# Patient Record
Sex: Male | Born: 1940 | Race: Black or African American | Hispanic: No | Marital: Married | Smoking: Former smoker
Health system: Southern US, Community
[De-identification: ages and names within clinical notes are randomized; demographics above are authoritative.]

## PROBLEM LIST (undated history)

## (undated) ENCOUNTER — Emergency Department (HOSPITAL_COMMUNITY): Payer: Medicare HMO | Source: Home / Self Care

## (undated) DIAGNOSIS — I428 Other cardiomyopathies: Secondary | ICD-10-CM

## (undated) DIAGNOSIS — K219 Gastro-esophageal reflux disease without esophagitis: Secondary | ICD-10-CM

## (undated) DIAGNOSIS — I639 Cerebral infarction, unspecified: Secondary | ICD-10-CM

## (undated) DIAGNOSIS — H409 Unspecified glaucoma: Secondary | ICD-10-CM

## (undated) DIAGNOSIS — M545 Low back pain, unspecified: Secondary | ICD-10-CM

## (undated) DIAGNOSIS — Z992 Dependence on renal dialysis: Secondary | ICD-10-CM

## (undated) DIAGNOSIS — M1990 Unspecified osteoarthritis, unspecified site: Secondary | ICD-10-CM

## (undated) DIAGNOSIS — R011 Cardiac murmur, unspecified: Secondary | ICD-10-CM

## (undated) DIAGNOSIS — Z9289 Personal history of other medical treatment: Secondary | ICD-10-CM

## (undated) DIAGNOSIS — C61 Malignant neoplasm of prostate: Secondary | ICD-10-CM

## (undated) DIAGNOSIS — J189 Pneumonia, unspecified organism: Secondary | ICD-10-CM

## (undated) DIAGNOSIS — G4730 Sleep apnea, unspecified: Secondary | ICD-10-CM

## (undated) DIAGNOSIS — E785 Hyperlipidemia, unspecified: Secondary | ICD-10-CM

## (undated) DIAGNOSIS — K759 Inflammatory liver disease, unspecified: Secondary | ICD-10-CM

## (undated) DIAGNOSIS — I5041 Acute combined systolic (congestive) and diastolic (congestive) heart failure: Secondary | ICD-10-CM

## (undated) DIAGNOSIS — Z9889 Other specified postprocedural states: Secondary | ICD-10-CM

## (undated) DIAGNOSIS — G8929 Other chronic pain: Secondary | ICD-10-CM

## (undated) DIAGNOSIS — N186 End stage renal disease: Secondary | ICD-10-CM

## (undated) DIAGNOSIS — B20 Human immunodeficiency virus [HIV] disease: Secondary | ICD-10-CM

## (undated) DIAGNOSIS — I4891 Unspecified atrial fibrillation: Secondary | ICD-10-CM

## (undated) DIAGNOSIS — I509 Heart failure, unspecified: Secondary | ICD-10-CM

## (undated) DIAGNOSIS — I5042 Chronic combined systolic (congestive) and diastolic (congestive) heart failure: Secondary | ICD-10-CM

## (undated) DIAGNOSIS — R001 Bradycardia, unspecified: Secondary | ICD-10-CM

## (undated) DIAGNOSIS — Z973 Presence of spectacles and contact lenses: Secondary | ICD-10-CM

## (undated) DIAGNOSIS — M109 Gout, unspecified: Secondary | ICD-10-CM

## (undated) DIAGNOSIS — I10 Essential (primary) hypertension: Secondary | ICD-10-CM

## (undated) DIAGNOSIS — I5043 Acute on chronic combined systolic (congestive) and diastolic (congestive) heart failure: Secondary | ICD-10-CM

## (undated) DIAGNOSIS — D509 Iron deficiency anemia, unspecified: Secondary | ICD-10-CM

## (undated) DIAGNOSIS — C801 Malignant (primary) neoplasm, unspecified: Secondary | ICD-10-CM

## (undated) DIAGNOSIS — A429 Actinomycosis, unspecified: Secondary | ICD-10-CM

## (undated) DIAGNOSIS — I469 Cardiac arrest, cause unspecified: Secondary | ICD-10-CM

## (undated) DIAGNOSIS — Z21 Asymptomatic human immunodeficiency virus [HIV] infection status: Secondary | ICD-10-CM

## (undated) HISTORY — DX: Essential (primary) hypertension: I10

## (undated) HISTORY — DX: Actinomycosis, unspecified: A42.9

## (undated) HISTORY — DX: Malignant (primary) neoplasm, unspecified: C80.1

## (undated) HISTORY — DX: Human immunodeficiency virus (HIV) disease: B20

## (undated) HISTORY — PX: EYE SURGERY: SHX253

## (undated) HISTORY — PX: TRANSTHORACIC ECHOCARDIOGRAM: SHX275

## (undated) HISTORY — DX: Asymptomatic human immunodeficiency virus (hiv) infection status: Z21

## (undated) HISTORY — PX: INSERTION PROSTATE RADIATION SEED: SUR718

## (undated) HISTORY — DX: Chronic combined systolic (congestive) and diastolic (congestive) heart failure: I50.42

## (undated) HISTORY — DX: Personal history of other medical treatment: Z92.89

## (undated) HISTORY — DX: Acute combined systolic (congestive) and diastolic (congestive) heart failure: I50.41

## (undated) HISTORY — DX: Other cardiomyopathies: I42.8

## (undated) HISTORY — PX: GLAUCOMA SURGERY: SHX656

## (undated) HISTORY — DX: Hyperlipidemia, unspecified: E78.5

## (undated) HISTORY — DX: Bradycardia, unspecified: R00.1

## (undated) HISTORY — PX: CATARACT EXTRACTION, BILATERAL: SHX1313

## (undated) HISTORY — DX: Other specified postprocedural states: Z98.890

## (undated) HISTORY — DX: Iron deficiency anemia, unspecified: D50.9

---

## 2000-02-01 ENCOUNTER — Emergency Department (HOSPITAL_COMMUNITY): Admission: EM | Admit: 2000-02-01 | Discharge: 2000-02-01 | Payer: Self-pay | Admitting: Emergency Medicine

## 2000-02-01 ENCOUNTER — Encounter: Payer: Self-pay | Admitting: Emergency Medicine

## 2000-02-08 ENCOUNTER — Emergency Department (HOSPITAL_COMMUNITY): Admission: EM | Admit: 2000-02-08 | Discharge: 2000-02-08 | Payer: Self-pay | Admitting: Emergency Medicine

## 2002-12-04 HISTORY — PX: LEFT HEART CATH AND CORONARY ANGIOGRAPHY: CATH118249

## 2008-05-22 ENCOUNTER — Ambulatory Visit (HOSPITAL_BASED_OUTPATIENT_CLINIC_OR_DEPARTMENT_OTHER): Admission: RE | Admit: 2008-05-22 | Discharge: 2008-05-22 | Payer: Self-pay | Admitting: Urology

## 2008-05-22 ENCOUNTER — Encounter (INDEPENDENT_AMBULATORY_CARE_PROVIDER_SITE_OTHER): Payer: Self-pay | Admitting: Urology

## 2008-12-04 HISTORY — PX: RADIOACTIVE SEED IMPLANT: SHX5150

## 2009-05-28 ENCOUNTER — Ambulatory Visit (HOSPITAL_COMMUNITY): Admission: RE | Admit: 2009-05-28 | Discharge: 2009-05-28 | Payer: Self-pay | Admitting: Urology

## 2009-07-02 ENCOUNTER — Ambulatory Visit: Admission: RE | Admit: 2009-07-02 | Discharge: 2009-09-27 | Payer: Self-pay | Admitting: Radiation Oncology

## 2009-09-27 ENCOUNTER — Ambulatory Visit: Admission: RE | Admit: 2009-09-27 | Discharge: 2009-10-24 | Payer: Self-pay | Admitting: Radiation Oncology

## 2009-10-19 ENCOUNTER — Encounter: Admission: RE | Admit: 2009-10-19 | Discharge: 2009-10-19 | Payer: Self-pay | Admitting: Urology

## 2009-11-12 ENCOUNTER — Ambulatory Visit (HOSPITAL_BASED_OUTPATIENT_CLINIC_OR_DEPARTMENT_OTHER): Admission: RE | Admit: 2009-11-12 | Discharge: 2009-11-12 | Payer: Self-pay | Admitting: Urology

## 2009-11-25 ENCOUNTER — Ambulatory Visit: Admission: RE | Admit: 2009-11-25 | Discharge: 2009-12-03 | Payer: Self-pay | Admitting: Radiation Oncology

## 2009-12-27 ENCOUNTER — Ambulatory Visit: Admission: RE | Admit: 2009-12-27 | Discharge: 2010-03-04 | Payer: Self-pay | Admitting: Radiation Oncology

## 2010-01-25 ENCOUNTER — Encounter: Admission: RE | Admit: 2010-01-25 | Discharge: 2010-01-25 | Payer: Self-pay | Admitting: Cardiovascular Disease

## 2010-02-21 HISTORY — PX: NM MYOCAR PERF WALL MOTION: HXRAD629

## 2010-06-03 DIAGNOSIS — I5042 Chronic combined systolic (congestive) and diastolic (congestive) heart failure: Secondary | ICD-10-CM

## 2010-06-03 HISTORY — DX: Chronic combined systolic (congestive) and diastolic (congestive) heart failure: I50.42

## 2010-11-23 ENCOUNTER — Emergency Department (HOSPITAL_COMMUNITY)
Admission: EM | Admit: 2010-11-23 | Discharge: 2010-11-23 | Payer: Self-pay | Source: Home / Self Care | Admitting: Emergency Medicine

## 2010-11-24 ENCOUNTER — Observation Stay (HOSPITAL_COMMUNITY)
Admission: EM | Admit: 2010-11-24 | Discharge: 2010-11-24 | Payer: Self-pay | Source: Home / Self Care | Admitting: Emergency Medicine

## 2010-11-24 ENCOUNTER — Encounter (INDEPENDENT_AMBULATORY_CARE_PROVIDER_SITE_OTHER): Payer: Self-pay | Admitting: Emergency Medicine

## 2010-11-30 ENCOUNTER — Ambulatory Visit: Payer: Self-pay | Admitting: Family

## 2010-11-30 DIAGNOSIS — E785 Hyperlipidemia, unspecified: Secondary | ICD-10-CM

## 2010-11-30 DIAGNOSIS — C61 Malignant neoplasm of prostate: Secondary | ICD-10-CM

## 2010-11-30 DIAGNOSIS — R7309 Other abnormal glucose: Secondary | ICD-10-CM | POA: Insufficient documentation

## 2010-11-30 DIAGNOSIS — G459 Transient cerebral ischemic attack, unspecified: Secondary | ICD-10-CM

## 2010-11-30 LAB — CONVERTED CEMR LAB
Cholesterol, target level: 200 mg/dL
HDL goal, serum: 40 mg/dL
LDL Goal: 100 mg/dL

## 2010-12-21 ENCOUNTER — Telehealth: Payer: Self-pay | Admitting: Family

## 2010-12-21 ENCOUNTER — Encounter: Payer: Self-pay | Admitting: Family

## 2010-12-25 ENCOUNTER — Encounter: Payer: Self-pay | Admitting: Internal Medicine

## 2010-12-26 ENCOUNTER — Ambulatory Visit
Admission: RE | Admit: 2010-12-26 | Discharge: 2010-12-26 | Payer: Self-pay | Source: Home / Self Care | Attending: Family | Admitting: Family

## 2010-12-26 ENCOUNTER — Encounter (INDEPENDENT_AMBULATORY_CARE_PROVIDER_SITE_OTHER): Payer: Self-pay | Admitting: *Deleted

## 2010-12-26 ENCOUNTER — Encounter: Payer: Self-pay | Admitting: Family

## 2010-12-27 ENCOUNTER — Telehealth: Payer: Self-pay | Admitting: Family

## 2010-12-29 ENCOUNTER — Telehealth: Payer: Self-pay | Admitting: Family

## 2011-01-05 NOTE — Progress Notes (Signed)
----   Converted from flag ---- ---- 12/29/2010 8:49 AM, Kelle Darting CMA (AAMA) wrote: Per York Cerise, the pt's cost for Zostavax in the office as of today is around $85. If pt decides to get inj. we will need to verify cost again as it may change. Spoke to pt this a.m. and he states he is going to hold off on Zostavax at this time due to several upcoming appts and costs.  ---- 12/26/2010 12:38 PM, [name] FNP wrote: Could you please check with York Cerise about coverage with [facility] for the zostavax?  Dr.  Shawna Orleans told me that they have some list or way of verifying insurance coverage.  Carlyon Shadow may know more.  Let me know- thanks! ------------------------------

## 2011-01-05 NOTE — Miscellaneous (Signed)
Summary: LAB ORDER  Clinical Lists Changes  Orders: Added new Test order of T-Basic Metabolic Panel (99991111) - Signed Added new Test order of T-Hepatic Function [phone]) - Signed Added new Test order of T-Lipid Profile [phone]) - Signed Added new Test order of T-CBC w/Diff [phone]) - Signed Added new Test order of T-TSH [id number]) - Signed

## 2011-01-05 NOTE — Assessment & Plan Note (Signed)
Summary: 1 month follow up/mhf--rm 5   Vital Signs:  Patient profile:   70 year old male Height:      73.5 inches Weight:      235.50 pounds BMI:     30.76 Temp:     98.1 degrees F oral Pulse rate:   72 / minute Pulse rhythm:   regular Resp:     16 per minute BP sitting:   110 / 70  (right arm) Cuff size:   large  Vitals Entered By: Kelle Darting CMA Deborra Medina) (December 26, 2010 9:45 AM) CC: Pt here for 1 month follow up. Is Patient Diabetic? No Pain Assessment Patient in pain? no      Comments Needs refill on Simvastatin. Pt states his doctor stopped Aggrenox (not needed). Increase ASA to 325mg  1 once daily and added Spironolactone 25mg  1 once daily. Gilmore Laroche Fergerson CMA Deborra Medina)  December 26, 2010 9:57 AM    Primary Care Provider:  [name] FNP  CC:  Pt here for 1 month follow up.Marland Kitchen  History of Present Illness: Mr.  Zidek is a 70 year old male who presents today for his 1 month follow up as well as CPX.  1) Hyperlipidemia-  Continues statin (LDL was 115 on 12/22)  2)Hx of TIA-  Pt reports that he was unable to tolerate aggrenox due to diarrhea.  His cardiologist stopped aggrenox and placed him on a full does aspirin.  3) Hx of adenocarcinoma of the prostate-  He follows with Dr. Janice Norrie and has a follow up apt scheduled in 1 month.   4) Preventative-  walks his dog twice daily, Last tetanus shot was greater than 10 years ago.  Pneumonia shot - never per patient.  Diet- portions "too big."   Eats a low sodium diet.    Allergies (verified): No Known Drug Allergies  Past History:  Past Medical History: Last updated: 12/26/2010 Hx of prostate cancer- s/p radioactive seed implant 11/10 (Dr. Janice Norrie) HTN- followed by [facility] and Vascular (Dr. Dani Gobble Croitoru) Hypercholesterolemia-  tachycardia? 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 Married- lives with his wife and 4 dogs (pit bulls) Alcohol use-yes Regular exercise-no  Review of Systems       Constitutional: Denies Fever ENT:  Denies nasal congestion or sore throat. Resp: Denies cough CV:  Denies Chest Pain GI:  Denies nausea or vomitting GU: Denies dysuria, good flow since starting finasteride Lymphatic: Denies lymphadenopathy Musculoskeletal:  Denies muscle/joint pain Skin:  Denies Rashes, or concerning lesions Psychiatric: Denies depression Neuro: Denies numbness or weakness     Denies CP, SOB or edema  Physical Exam  General:  Well-developed,well-nourished,in no acute distress; alert,appropriate and cooperative throughout examination Head:  Normocephalic and atraumatic without obvious abnormalities. No apparent alopecia or balding. Eyes:  PERRLA, sclera is clear.   Ears:  External ear exam shows no significant lesions or deformities.  Otoscopic examination reveals clear canals, tympanic membranes are intact bilaterally without bulging, retraction, inflammation or discharge. Hearing is grossly normal bilaterally. Mouth:  Oral mucosa and oropharynx without lesions or exudates.  Teeth in good repair. Neck:  No deformities, masses, or tenderness noted. Lungs:  Normal respiratory effort, chest expands symmetrically. Lungs are clear  to auscultation, no crackles or wheezes. Heart:  Normal rate and regular rhythm. S1 and S2 normal without gallop, murmur, click, rub or other extra sounds. Abdomen:  Bowel sounds positive,abdomen soft and non-tender without masses, organomegaly or hernias noted. Rectal:  Deferred to urology Prostate:  deferred to Urology Msk:  No deformity or scoliosis noted of thoracic or lumbar spine.   Pulses:  negative carotid  bruits. Extremities:  No clubbing, cyanosis, edema, or deformity noted with normal full range of motion of all joints.   Neurologic:  alert & oriented X3, cranial nerves II-XII intact, strength normal in all extremities, and DTRs symmetrical and normal.   Skin:  Intact without suspicious lesions or rashes Cervical Nodes:  No lymphadenopathy noted Psych:  Cognition and judgment appear intact. Alert and cooperative with normal attention span and concentration. No apparent delusions, illusions, hallucinations   Impression & Recommendations:  Problem # 1:  Preventive Health Care (ICD-V70.0) Assessment Comment Only Immunizations reviewed.  Pneumovax and tetanus given today.  Patient counseled on diet, exercise and weight loss.  Never had colonoscopy.  Will refer.  Urology is monitoring his PSA/prostate.  Also candidate for Zostavax- pt wished to find out if his insurance will cover first.  Problem # 2:  HYPERLIPIDEMIA (ICD-272.4) Assessment: Comment Only LDL not at goal.  Will plan to treat to LDL < 70.  It appears that he was started on simvastatin only after his TIA.  Pt to return for a fasting lipid profile. His updated medication list for this problem includes:    Simvastatin 20 Mg Tabs (Simvastatin) .Marland Kitchen... Take 1 tab by mouth at bedtime.  Problem # 3:  HYPERTENSION (ICD-401.9) Assessment: Comment Only Stable, cardiology is managing his BP meds and recently added spironolactone. His updated medication list for this problem includes:    Losartan Potassium-hctz 100-25 Mg Tabs (Losartan potassium-hctz) .Marland Kitchen... Take 1 tablet by mouth once a day.    Carvedilol 25 Mg Tabs (Carvedilol) .Marland Kitchen... Take 1 tablet by mouth two times a day.    Terazosin Hcl 10 Mg Caps (Terazosin hcl) .Marland Kitchen... Take 1 tab by mouth at bedtime.    Spironolactone 25 Mg Tabs (Spironolactone) .Marland Kitchen... Take 1 tablet by mouth once a day.  BP today: 110/70 Prior BP: 132/82 (11/30/2010)  Prior 10 Yr Risk Heart Disease: Not enough  information (11/30/2010)  Complete Medication List: 1)  Losartan Potassium-hctz 100-25 Mg Tabs (Losartan potassium-hctz) .... Take 1 tablet by mouth once a day. 2)  Finasteride 5 Mg Tabs (Finasteride) .... Take 1 tablet by mouth once a day 3)  Carvedilol 25 Mg Tabs (Carvedilol) .... Take 1 tablet by mouth two times a day. 4)  Terazosin Hcl 10 Mg Caps (Terazosin hcl) .... Take 1 tab by mouth at bedtime. 5)  Simvastatin 20 Mg Tabs (Simvastatin) .... Take 1 tab by mouth at bedtime. 6)  Spironolactone 25 Mg Tabs (Spironolactone) .... Take 1 tablet by mouth once a day. 7)  Ecotrin 325 Mg Tbec (Aspirin) .... Take 1 tablet by mouth once a day.  Other Orders: Tdap => 30yrs IM [id number]) Pneumococcal Vaccine [id number]) Admin 1st Vaccine [id number]) Admin of Any Addtl Vaccine [id number]) Gastroenterology Referral (GI)  Patient Instructions: 1)  You will be contacted about your referral for a colonoscopy.   2)  Please complete your lab work fasting on the first floor within the nex 1-2 weeks. 3)  Follow up in 3 months.   Orders Added: 1)  Tdap => 35yrs IM [id number] 2)  Pneumococcal Vaccine [id number]  3)  Admin 1st Vaccine [90471] 4)  Admin of Any Addtl Vaccine [90472] 5)  Gastroenterology Referral [GI] 6)  Est. Patient 65& > [99397] 7)  Est. Patient Level III [id number]   Immunizations Administered:  Tetanus Vaccine:    Vaccine Type: Tdap    Site: right deltoid    Mfr: GlaxoSmithKline    Dose: 0.5 ml    Route: IM    Given by: Kelle Darting CMA ([place])    Exp. Date: 09/22/2012    Lot #: [id number]    VIS given: 10/21/08 version given December 26, 2010.  Pneumonia Vaccine:    Vaccine Type: Pneumovax    Site: left deltoid    Mfr: Merck    Dose: 0.5 ml    Route: IM    Given by: Kelle Darting CMA ([place])    Exp. Date: 04/27/2012    Lot #: [id number]    VIS given: 11/08/09 version given December 26, 2010.   Immunizations Administered:  Tetanus Vaccine:    Vaccine Type: Tdap    Site: right  deltoid    Mfr: GlaxoSmithKline    Dose: 0.5 ml    Route: IM    Given by: Kelle Darting CMA ([place])    Exp. Date: 09/22/2012    Lot #: [id number]    VIS given: 10/21/08 version given December 26, 2010.  Pneumonia Vaccine:    Vaccine Type: Pneumovax    Site: left deltoid    Mfr: Merck    Dose: 0.5 ml    Route: IM    Given by: Kelle Darting CMA ([place])    Exp. Date: 04/27/2012    Lot #: [id number]    VIS given: 11/08/09 version given December 26, 2010.  Current Allergies (reviewed today): No known allergies

## 2011-01-05 NOTE — Assessment & Plan Note (Signed)
   Allergies: No Known Drug Allergies  Past History:  Past Medical History: Hx of prostate cancer- s/p radioactive seed implant 11/10 (Dr. Janice Norrie) HTN- followed by [facility] and Vascular (Dr. Dani Gobble Croitoru) Hypercholesterolemia-  tachycardia? 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  (LVEF 45%- 06/2010) Cardiac Cath 2004-  neg for CAD Mycoardal perfusion study neg for coronary insufficiency (LVEF 27%) 3/11  Past Surgical History: prostate seed implants--2010   Complete Medication List: 1)  Losartan Potassium-hctz 100-25 Mg Tabs (Losartan potassium-hctz) .... Take 1 tablet by mouth once a day. 2)  Finasteride 5 Mg Tabs (Finasteride) .... Take 1 tablet by mouth once a day 3)  Carvedilol 25 Mg Tabs (Carvedilol) .... Take 1 tablet by mouth two times a day. 4)  Terazosin Hcl 10 Mg Caps (Terazosin hcl) .... Take 1 tab by mouth at bedtime. 5)  Aggrenox 25-200 Mg Xr12h-cap (Aspirin-dipyridamole) .... One cap by mouth once daily for 1 week, then increase to twice daily on second week 6)  Simvastatin 20 Mg Tabs (Simvastatin) .... Take 1 tab by mouth at bedtime.

## 2011-01-05 NOTE — Assessment & Plan Note (Signed)
Summary: new to be est [facility]/mhf--rm 4   Vital Signs:  Patient profile:   70 year old male Height:      73.5 inches Weight:      234 pounds BMI:     30.56 O2 Sat:      99 % on Room air Temp:     97.8 degrees F oral Pulse rate:   69 / minute Pulse rhythm:   regular Resp:     16 per minute BP sitting:   132 / 82  (right arm) Cuff size:   large  Vitals Entered By: Kelle Darting CMA Deborra Medina) (November 30, 2010 9:54 AM)  O2 Flow:  Room air CC: New pt to establish care. Recently evaluated in the ER for possible strokes and elevated cholesterol., Lipid Management Is Patient Diabetic? No Pain Assessment Patient in pain? no        CC:  New pt to establish care. Recently evaluated in the ER for possible strokes and elevated cholesterol. and Lipid Management.  History of Present Illness: Mr.  Go is a 70 year old male who presents today to establish care.  Presents today following an overnight stay at [facility] for a suspected TIA.  1)TIA-  Reports that he woke up on 12/22 with numbness and right arm weakness on/off for 2 hours.   Symptoms at that time were worsened by nothing and were improved by nothing.  he went to the [facility] ED where he underwent stroke evaluation. CT head was negative for acute CVA.  An MRI was attempted, but discontinued due to finding of some metal appearing shard in he orbit.   Denied associated slurred speech.  He reports that he was on aspirin 81 mg at the time of the event.  2)CHF-  he denies know history of this.  2-D echo notes LVEF of 30-35% and grade 1 Diastolic dysfunction.  He does follow with [place] and V[facility] (previously Dr. Melvern Banker) but tells me that they only follow him for his blood pressure.    3)Hyperlipidemia- pt was noted to have mild elevation of LDL at [facility] was sent home on a statin.    4) Hyperglycemia- pt was noted to have an A1C of 6.3.  Prior to this he was unaware of any hyperglycemia issues.  Lipid Management History:  Positive NCEP/ATP III risk factors include male age 31 years old or older, hypertension, and prior stroke (or TIA).  Negative NCEP/ATP III risk factors include non-tobacco-user status.    Preventive Screening-Counseling & Management  Alcohol-Tobacco     Alcohol drinks/day: <1     Smoking Status: quit     Year Quit: 2008  Caffeine-Diet-Exercise     Caffeine use/day: 2 cups coffee daily, 3-4 sodas daily     Does Patient Exercise: no  Allergies (verified): No Known Drug Allergies  Past History:  Family History: Last updated: 11/30/2010 HTN-- mother, [employer] Diabets-- MGM Thyroid prob?-- mother  3 children 1 son-- gallstones 1 daughter-- gallstones  Past Medical History: Hx of prostate cancer- s/p radioactive seed implant 11/10 (Dr. Janice Norrie) HTN- followed by [facility] Vascular Hypercholesterolemia-  tachycardia?  Past Surgical History: prostate seed implants--2010  Family History: HTN-- mother, [employer] Diabets-- MGM Thyroid prob?-- mother  3 children 1 son-- gallstones 1 daughter-- gallstones  Social History: Retired. Stays active at home. Married Alcohol use-yes Regular exercise-no Smoking Status:  quit Caffeine use/day:  2 cups coffee daily, 3-4 sodas daily Does Patient Exercise:  no  Review of Systems  Constitutional: Denies Fever ENT:  Denies nasal congestion or sore throat. Resp: Denies cough CV:  Denies Chest Pain GI:  Denies nausea or vomitting, occasional abdominal gas/belching GU: Denies dysuria,  slow urinary stream- using finasteride per dr. Janice Norrie with improvement. Lymphatic: Denies lymphadenopathy Musculoskeletal:  Denies muscle/joint pain, some left shoulder pain with lifting Skin:  Denies Rashes Psychiatric: Denies depression or anxiety Neuro: Denies numbness or weakness today.     Physical Exam  General:  Well-developed,well-nourished,in no acute distress; alert,appropriate and cooperative throughout examination Head:   Normocephalic and atraumatic without obvious abnormalities. No apparent alopecia or balding. Lungs:  Normal respiratory effort, chest expands symmetrically. Lungs are clear to auscultation, no crackles or wheezes. Heart:  Normal rate and regular rhythm. S1 and S2 normal without gallop, murmur, click, rub or other extra sounds. Extremities:  No peripheral edema is noted Neurologic:  alert & oriented X3, strength normal in all extremities, gait normal, and DTRs symmetrical and normal.   Psych:  Cognition and judgment appear intact. Alert and cooperative with normal attention span and concentration. No apparent delusions, illusions, hallucinations   Impression & Recommendations:  Problem # 1:  TIA (ICD-435.9) Assessment New Patient with recent symptoms consistent with TIA.  Was on baby aspirin at time of event.  Will swich to Aggrenox.  Work up included carotid dopplers which were negative, 2-D echo samples provided for aggrenox 3/14 exp lot [id number]  His updated medication list for this problem includes:    Aggrenox 25-200 Mg Xr12h-cap (Aspirin-dipyridamole) ..... One cap by mouth once daily for 1 week, then increase to twice daily on second week  Problem # 2:  DIASTOLIC DYSFUNCTION (0000000.9) Assessment: Comment Only  Not sure if this is a new finding.  He has f/u with cardiology in February.  Will see if they can see him sooner.   Records requested.  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  This is being followed by Dr. Lowella Bandy  Problem # 4:  HYPERLIPIDEMIA (ICD-272.4) Assessment: New Plan to continue simvastatin His updated medication list for this problem includes:    Simvastatin 20 Mg Tabs (Simvastatin) .Marland Kitchen... Take 1 tab by mouth at bedtime.  Problem # 5:  HYPERGLYCEMIA (ICD-790.29) Assessment: New Patient was counseled on low fat diabetic diet,  exercise, and weight loss.    Complete Medication List: 1)  Losartan Potassium-hctz 100-25 Mg Tabs (Losartan potassium-hctz) .... Take 1 tablet by mouth once a day. 2)  Finasteride 5 Mg Tabs (Finasteride) .... Take 1 tablet by mouth once a day 3)  Carvedilol 25 Mg Tabs (Carvedilol) .... Take 1 tablet by mouth two times a day. 4)  Terazosin Hcl 10 Mg Caps (Terazosin hcl) .... Take 1 tab by mouth at bedtime. 5)  Aggrenox 25-200 Mg Xr12h-cap (Aspirin-dipyridamole) .... One cap by mouth once daily for 1 week, then increase to twice daily on second week 6)  Simvastatin 20 Mg Tabs (Simvastatin) .... Take 1 tab by mouth at bedtime.  Lipid Assessment/Plan:      Based on NCEP/ATP III, the patient's risk factor category is "history of coronary disease, peripheral vascular disease, cerebrovascular disease, or aortic aneurysm".  The patient's lipid goals are as follows: Total cholesterol goal is 200; LDL cholesterol goal is 100; HDL cholesterol goal is 40; Triglyceride goal is 150.     Patient Instructions: 1)  Work hard on diet, exercise, and weight loss.  2)  Limit  your carbohydrates and avoid concentrated sweets. 3)  Stop Aspirin, start aggrenox.   4)  Go to ER if you develop recurrent weakness, numbness, or slurred speech. 5)  Follow up in 1 month, come fasting to this appointment. Prescriptions: SIMVASTATIN 20 MG TABS (SIMVASTATIN) Take 1 tab by mouth at bedtime.  #30 x 0   Entered and Authorized by:   [name] FNP   Signed by:   [name] FNP on 11/30/2010   Method used:   Electronically to        [facility]. [id number]* (retail)       [place], [place]  [address]       Ph: [phone]       Fax: [phone]   RxID:   [id number] AGGRENOX 25-200 MG XR12H-CAP (ASPIRIN-DIPYRIDAMOLE) one cap by mouth once daily for 1 week, then increase to twice daily on second week  #60 x 0   Entered and Authorized by:   [name] FNP   Signed  by:   [name] FNP on 11/30/2010   Method used:   Electronically to        [facility]. [id number]* (retail)       [place], [place]  [address]       Ph: [phone]       Fax: [phone]   RxID:   [id number]    Orders Added: 1)  Cardiology Referral [Cardiology] 2)  New Patient Level III [99203]      Preventive Care Screening  Last Flu Shot:    Date:  10/04/2010    Results:  historical      Pt doesn't remember last tetanus shot. Never had colonoscopy. Gilmore Laroche Fergerson CMA ([place])  November 30, 2010 10:14 AM    Current Allergies (reviewed today): No known allergies

## 2011-01-05 NOTE — Letter (Signed)
Summary: Pre Visit Letter Revised  [facility]  [place], [place] [address]   Phone: [phone]  Fax: [phone]        12/26/2010 MRN: [id number] David [name] [address] [place], [place]  [address]             Procedure Date:  January 25, 2011   dir col -Dr. Carlean Purl  Welcome to the Gastroenterology Division at [facility].    You are scheduled to see a nurse for your pre-procedure visit on January 11, 2011 at 1:00pm on the 3rd floor at [facility] [place] [facility].  We ask that you try to arrive at our office 15 minutes prior to your appointment time to allow for check-in.  Please take a minute to review the attached form.  If you answer "Yes" to one or more of the questions on the first page, we ask that you call the person listed at your earliest opportunity.  If you answer "No" to all of the questions, please complete the rest of the form and bring it to your appointment.    Your nurse visit will consist of discussing your medical and surgical history, your immediate family medical history, and your medications.   If you are unable to list all of your medications on the form, please bring the medication bottles to your appointment and we will list them.  We will need to be aware of both prescribed and over the counter drugs.  We will need to know exact dosage information as well.    Please be prepared to read and sign documents such as consent forms, a financial agreement, and acknowledgement forms.  If necessary, and with your consent, a friend or relative is welcome to sit-in on the nurse visit with you.  Please bring your insurance card so that we may make a copy of it.  If your insurance requires a referral to see a specialist, please bring your referral form from your primary care physician.  No co-pay is required for this nurse visit.     If you cannot keep your appointment, please call [phone] to cancel or reschedule prior  to your appointment date.  This allows [place] the opportunity to schedule an appointment for another patient in need of care.    Thank you for choosing [facility] for your medical needs.  We appreciate the opportunity to care for you.  Please visit [place] at our website  to learn more about our practice.  Sincerely, The Gastroenterology Division

## 2011-01-05 NOTE — Progress Notes (Addendum)
----   Converted from flag ---- ---- 12/26/2010 3:30 PM, Kelle Darting CMA (AAMA) wrote: Order has been entered and faxed.  ---- 12/26/2010 10:27 AM, [name] FNP wrote: Please fax order for  TSH, FLP, BMET, LFT, CBC to lab (v70) ------------------------------  Appended Document:  Received call from [place] in the lab stating [employer] will not cover FLP, CBC & TSH with V70.0 code. Are there other codes to submit?  Appended Document:  The lipid is denied due to  test too frequent per Katrina.  Appended Document:  I have reviewed laboratories performed during his hospitalization and we can cancel above labs.  Please notify patient and let him know that we apologize for any inconvenience.    Appended Document:  Pt and lab notified.

## 2011-01-05 NOTE — Progress Notes (Signed)
Summary: Records received  Phone Note Call from Patient   Summary of Call: Records received from [facility] and forwarded to Provider for review.  Gilmore Laroche Fergerson CMA Deborra Medina)  December 21, 2010 12:01 PM

## 2011-01-10 ENCOUNTER — Encounter (INDEPENDENT_AMBULATORY_CARE_PROVIDER_SITE_OTHER): Payer: Self-pay | Admitting: *Deleted

## 2011-01-11 ENCOUNTER — Encounter: Payer: Self-pay | Admitting: Internal Medicine

## 2011-01-19 NOTE — Miscellaneous (Signed)
Summary: [facility] Previsit/prep  Clinical Lists Changes  Medications: Added new medication of MOVIPREP 100 GM  SOLR (PEG-KCL-NACL-NASULF-NA ASC-C) As per prep instructions. - Signed Rx of MOVIPREP 100 GM  SOLR (PEG-KCL-NACL-NASULF-NA ASC-C) As per prep instructions.;  #1 x 0;  Signed;  Entered by: [name] RN;  Authorized by: Gatha Mayer MD, FACG;  Method used: Electronically to [facility]. [id number]*, [address], [place], [place]  [address], Ph: [phone], Fax: [phone] Observations: Added new observation of NKA: T (01/11/2011 12:29)    Prescriptions: MOVIPREP 100 GM  SOLR (PEG-KCL-NACL-NASULF-NA ASC-C) As per prep instructions.  #1 x 0   Entered by:   [name] RN   Authorized by:   Gatha Mayer MD, [facility]   Signed by:   [name] RN on 01/11/2011   Method used:   Electronically to        [facility]. [id number]* (retail)       [place], [place]  [address]       Ph: [phone]       Fax: [phone]   RxID:   [id number]

## 2011-01-19 NOTE — Letter (Signed)
Summary: [facility] Instructions  [facility]  [place], [place] [address]   Phone: [phone]  Fax: [phone]       David [name]    [birth year]    MRN: [id number]        Procedure Day Sudie Grumbling:  [facility]  01/25/11     Arrival Time:  12:30PM     Procedure Time:  1:30PM     Location of Procedure:                    _ X_  [facility] (4th Floor)                      [facility]   Starting 5 days prior to your procedure 01/20/11 do not eat nuts, seeds, popcorn, corn, beans, peas,  salads, or any raw vegetables.  Do not take any fiber supplements (e.g. Metamucil, Citrucel, and Benefiber).  THE DAY BEFORE YOUR PROCEDURE         DATE: 01/24/11    DAY: TUESDAY  1.  Drink clear liquids the entire day-NO SOLID FOOD  2.  Do not drink anything colored red or purple.  Avoid juices with pulp.  No orange juice.  3.  Drink at least 64 oz. (8 glasses) of fluid/clear liquids during the day to prevent dehydration and help the prep work efficiently.  CLEAR LIQUIDS INCLUDE: Water Jello Ice Popsicles Tea (sugar ok, no milk/cream) Powdered fruit flavored drinks Coffee (sugar ok, no milk/cream) Gatorade Juice: apple, white grape, white cranberry  Lemonade Clear bullion, consomm, broth Carbonated beverages (any kind) Strained chicken noodle soup Hard Candy                             4.  In the morning, mix first dose of MoviPrep solution:    Empty 1 Pouch A and 1 Pouch B into the disposable container    Add lukewarm drinking water to the top line of the container. Mix to dissolve    Refrigerate (mixed solution should be used within 24 hrs)  5.  Begin drinking the prep at 5:00 p.m. The MoviPrep container is divided by 4 marks.   Every 15 minutes drink the solution down to the next mark (approximately 8 oz) until the full liter is complete.   6.  Follow completed prep with 16 oz of clear liquid of your choice  (Nothing red or purple).  Continue to drink clear liquids until bedtime.  7.  Before going to bed, mix second dose of MoviPrep solution:    Empty 1 Pouch A and 1 Pouch B into the disposable container    Add lukewarm drinking water to the top line of the container. Mix to dissolve    Refrigerate  THE DAY OF YOUR PROCEDURE      DATE: 01/25/11   DAY: WEDNESDAY  Beginning at 8:30AM (5 hours before procedure):         1. Every 15 minutes, drink the solution down to the next mark (approx 8 oz) until the full liter is complete.  2. Follow completed prep with 16 oz. of clear liquid of your choice.    3. You may drink clear liquids until 11:30AM (2 HOURS BEFORE PROCEDURE).   MEDICATION INSTRUCTIONS   Additional medication instructions: Take Carvedilol and Finasteride the morning of procedure.  Hold all other medicine until after procedure.  OTHER INSTRUCTIONS  You will need a responsible adult at least 69 years of age to accompany you and drive you home.   This person must remain in the waiting room during your procedure.  Wear loose fitting clothing that is easily removed.  Leave jewelry and other valuables at home.  However, you may wish to bring a book to read or  an iPod/MP3 player to listen to music as you wait for your procedure to start.  Remove all body piercing jewelry and leave at home.  Total time from sign-in until discharge is approximately 2-3 hours.  You should go home directly after your procedure and rest.  You can resume normal activities the  day after your procedure.  The day of your procedure you should not:   Drive   Make legal decisions   Operate machinery   Drink alcohol   Return to work  You will receive specific instructions about eating, activities and medications before you leave.    The above instructions have been reviewed and explained to me by   David Monte RN  January 11, 2011 1:03 PM     I fully understand and can  verbalize these instructions _____________________________ Date _________

## 2011-01-19 NOTE — Letter (Signed)
Summary: Records Dated 6-09 thru 12-11/[facility]  Records Dated 6-09 thru 12-11/[facility]   Imported By: Edmonia James 01/10/2011 11:56:23  _____________________________________________________________________  External Attachment:    Type:   Image     Comment:   External Document

## 2011-01-25 ENCOUNTER — Other Ambulatory Visit (AMBULATORY_SURGERY_CENTER): Payer: Medicare PPO | Admitting: Internal Medicine

## 2011-01-25 ENCOUNTER — Other Ambulatory Visit: Payer: Self-pay | Admitting: Internal Medicine

## 2011-01-25 DIAGNOSIS — D1330 Benign neoplasm of unspecified part of small intestine: Secondary | ICD-10-CM

## 2011-01-25 DIAGNOSIS — D126 Benign neoplasm of colon, unspecified: Secondary | ICD-10-CM

## 2011-01-25 DIAGNOSIS — Z1211 Encounter for screening for malignant neoplasm of colon: Secondary | ICD-10-CM

## 2011-01-25 LAB — HM COLONOSCOPY

## 2011-01-31 ENCOUNTER — Encounter: Payer: Self-pay | Admitting: Internal Medicine

## 2011-01-31 NOTE — Procedures (Addendum)
Summary: Colonoscopy  Patient: [name] Note: All result statuses are Final unless otherwise noted.  Tests: (1) Colonoscopy (COL)   COL Colonoscopy           [facility]     [place], [place]  [address]           COLONOSCOPY PROCEDURE REPORT           PATIENT:  David [name], David [name]  MR#:  [id number]     BIRTHDATE:  01/30/1941, 69 yrs. old  GENDER:  male     ENDOSCOPIST:  Gatha Mayer, MD, [facility]     REF. BY:  Jeri LagerConley Canal, N.P.     PROCEDURE DATE:  01/25/2011     PROCEDURE:  Colonoscopy with biopsy     ASA CLASS:  Class II     INDICATIONS:  Routine Risk Screening     MEDICATIONS:   Fentanyl 50 mcg IV, Versed 5 mg IV           DESCRIPTION OF PROCEDURE:   After the risks benefits and     alternatives of the procedure were thoroughly explained, informed     consent was obtained.  Digital rectal exam was performed and     revealed no abnormalities.   The LB 180AL [id number] endoscope was     introduced through the anus and advanced to the cecum, which was     identified by both the appendix and ileocecal valve, without     limitations.  The quality of the prep was excellent, using     MoviPrep.  The instrument was then slowly withdrawn as the colon     was fully examined. Insertion: 2:39 minutes Withdrawal: 12:59     minutes     <<PROCEDUREIMAGES>>           FINDINGS:  Three polyps were found. All 1-2 mm, cecum, transverse     and descending colon. The polyps were removed using cold biopsy     forceps.  This was otherwise a normal examination of the colon.     Retroflexed views in the right colon and rectum revealed no     abnormalities.    The scope was then withdrawn from the patient     and the procedure completed.           COMPLICATIONS:  None     ENDOSCOPIC IMPRESSION:     1) Three diminutive polyps removed     2) Otherwise normal examination, excellent prep           REPEAT EXAM:  In for Colonoscopy, pending biopsy results.        Gatha Mayer, MD, [facility]           CC:  [name] FNP     The Patient           n.     Lorrin Mais:   Gatha Mayer at 01/25/2011 02:22 PM           [name], [id number]  Note: An exclamation mark (!) indicates a result that was not dispersed into the flowsheet. Document Creation Date: 01/25/2011 2:23 PM _______________________________________________________________________  (1) Order result status: Final Collection or observation date-time: 01/25/2011 14:11 Requested date-time:  Receipt date-time:  Reported date-time:  Referring Physician:   Ordering Physician: Silvano Rusk [phone]) Specimen Source:  Source: Tawanna Cooler Order Number: [id number] Lab site:   Appended Document: Colonoscopy  Procedures Next Due Date:    Colonoscopy: 02/2016

## 2011-02-09 NOTE — Letter (Signed)
Summary: Patient Notice-Hyperplastic Polyps  [facility]  [place], [place] [address]   Phone: [phone]  Fax: [phone]        January 31, 2011 MRN: [id number]    David [name] [address] [place], [place]  [address]    Dear David [name],  I am pleased to inform you that the colon polyps removed during your recent colonoscopy were NOT pre-cancerous.  It is therefore my recommendation that you have a repeat colonoscopy examination in 10 years for routine colorectal cancer screening.  Should you develop new or worsening symptoms of abdominal pain, bowel habit changes or bleeding from the rectum or bowels, please schedule an evaluation with either your primary care physician or with me.  Please call us if you are having persistent problems or have questions about your condition that have not been fully answered at this time.  Sincerely,  Gatha Mayer MD, [facility] This letter has been electronically signed by your physician.  Appended Document: Patient Notice-Hyperplastic Polyps letter mailed

## 2011-02-10 ENCOUNTER — Encounter: Payer: Self-pay | Admitting: Family

## 2011-02-13 LAB — POCT CARDIAC MARKERS: Myoglobin, poc: 118 ng/mL (ref 12–200)

## 2011-02-13 LAB — POCT I-STAT, CHEM 8
Calcium, Ion: 1.13 mmol/L (ref 1.12–1.32)
HCT: 37 % — ABNORMAL LOW (ref 39.0–52.0)
Hemoglobin: 12.6 g/dL — ABNORMAL LOW (ref 13.0–17.0)
TCO2: 29 mmol/L (ref 0–100)

## 2011-02-13 LAB — CBC
HCT: 36.5 % — ABNORMAL LOW (ref 39.0–52.0)
MCHC: 33.4 g/dL (ref 30.0–36.0)
MCV: 92.9 fL (ref 78.0–100.0)
RDW: 14 % (ref 11.5–15.5)

## 2011-02-13 LAB — DIFFERENTIAL
Basophils Absolute: 0 10*3/uL (ref 0.0–0.1)
Basophils Relative: 1 % (ref 0–1)
Eosinophils Relative: 5 % (ref 0–5)
Monocytes Absolute: 0.5 10*3/uL (ref 0.1–1.0)
Monocytes Relative: 10 % (ref 3–12)

## 2011-02-13 LAB — URINALYSIS, ROUTINE W REFLEX MICROSCOPIC
Bilirubin Urine: NEGATIVE
Nitrite: NEGATIVE
Protein, ur: NEGATIVE mg/dL
Specific Gravity, Urine: 1.015 (ref 1.005–1.030)
Urobilinogen, UA: 0.2 mg/dL (ref 0.0–1.0)

## 2011-02-13 LAB — LIPID PANEL
HDL: 39 mg/dL — ABNORMAL LOW (ref >39)
LDL Cholesterol: 115 mg/dL — ABNORMAL HIGH (ref 0–99)
Total CHOL/HDL Ratio: 4.3 RATIO
VLDL: 13 mg/dL (ref 0–40)

## 2011-02-13 LAB — HEMOGLOBIN A1C: Mean Plasma Glucose: 134 mg/dL — ABNORMAL HIGH (ref <117)

## 2011-02-13 LAB — HEPATIC FUNCTION PANEL
ALT: 16 U/L (ref 0–53)
Bilirubin, Direct: 0.2 mg/dL (ref 0.0–0.3)
Indirect Bilirubin: 0.4 mg/dL (ref 0.3–0.9)
Total Bilirubin: 0.6 mg/dL (ref 0.3–1.2)

## 2011-02-13 LAB — PROTIME-INR: Prothrombin Time: 13.1 seconds (ref 11.6–15.2)

## 2011-02-21 ENCOUNTER — Ambulatory Visit: Payer: Self-pay | Admitting: Family

## 2011-03-07 LAB — APTT: aPTT: 27 seconds (ref 24–37)

## 2011-03-07 LAB — CBC
HCT: 37.5 % — ABNORMAL LOW (ref 39.0–52.0)
Hemoglobin: 12.3 g/dL — ABNORMAL LOW (ref 13.0–17.0)
MCHC: 32.8 g/dL (ref 30.0–36.0)
MCV: 93.4 fL (ref 78.0–100.0)
RBC: 4.01 MIL/uL — ABNORMAL LOW (ref 4.22–5.81)
WBC: 4.2 10*3/uL (ref 4.0–10.5)

## 2011-03-07 LAB — COMPREHENSIVE METABOLIC PANEL
BUN: 12 mg/dL (ref 6–23)
CO2: 27 mEq/L (ref 19–32)
Calcium: 9.1 mg/dL (ref 8.4–10.5)
Chloride: 105 mEq/L (ref 96–112)
Creatinine, Ser: 1.29 mg/dL (ref 0.4–1.5)
GFR calc non Af Amer: 56 mL/min — ABNORMAL LOW (ref >60)
Glucose, Bld: 98 mg/dL (ref 70–99)
Total Bilirubin: 0.8 mg/dL (ref 0.3–1.2)

## 2011-03-07 LAB — PROTIME-INR: Prothrombin Time: 13.6 seconds (ref 11.6–15.2)

## 2011-03-28 ENCOUNTER — Ambulatory Visit (INDEPENDENT_AMBULATORY_CARE_PROVIDER_SITE_OTHER): Payer: Medicare PPO | Admitting: Family

## 2011-03-28 ENCOUNTER — Other Ambulatory Visit: Payer: Self-pay | Admitting: Family

## 2011-03-28 ENCOUNTER — Encounter: Payer: Self-pay | Admitting: Family

## 2011-03-28 DIAGNOSIS — R739 Hyperglycemia, unspecified: Secondary | ICD-10-CM

## 2011-03-28 DIAGNOSIS — R5383 Other fatigue: Secondary | ICD-10-CM

## 2011-03-28 DIAGNOSIS — I10 Essential (primary) hypertension: Secondary | ICD-10-CM

## 2011-03-28 DIAGNOSIS — E785 Hyperlipidemia, unspecified: Secondary | ICD-10-CM

## 2011-03-28 LAB — HEPATIC FUNCTION PANEL
Albumin: 4 g/dL (ref 3.5–5.2)
Alkaline Phosphatase: 86 U/L (ref 39–117)
Indirect Bilirubin: 0.3 mg/dL (ref 0.0–0.9)
Total Bilirubin: 0.4 mg/dL (ref 0.3–1.2)
Total Protein: 7.2 g/dL (ref 6.0–8.3)

## 2011-03-28 LAB — CBC
Hemoglobin: 11.5 g/dL — ABNORMAL LOW (ref 13.0–17.0)
MCH: 31.1 pg (ref 26.0–34.0)
MCV: 92.7 fL (ref 78.0–100.0)
Platelets: 250 10*3/uL (ref 150–400)
RBC: 3.7 MIL/uL — ABNORMAL LOW (ref 4.22–5.81)
WBC: 3.7 10*3/uL — ABNORMAL LOW (ref 4.0–10.5)

## 2011-03-28 LAB — BASIC METABOLIC PANEL
CO2: 25 mEq/L (ref 19–32)
Calcium: 9.5 mg/dL (ref 8.4–10.5)
Creat: 1.44 mg/dL (ref 0.40–1.50)
Sodium: 138 mEq/L (ref 135–145)

## 2011-03-28 NOTE — Patient Instructions (Addendum)
Please go to the lab on the first floor today. Please return fasting to the lab one day in the next 1 week for your fasting cholesterol. Call your insurance to see if they will cover the shingles vaccine- Zostavax and then call us to schedule a nurse visit.

## 2011-03-28 NOTE — Progress Notes (Signed)
Subjective:    Patient ID: David [name], male    DOB: 1941-02-25, 70 y.o.   MRN: [id number]  HPI  Cardiology- Follows at [facility].  He reports that they stopped his cholesterol medication.   Review of Systems  Respiratory: Negative for choking, chest tightness and shortness of breath.   Cardiovascular: Negative for chest pain and palpitations.  Genitourinary:       Urine is slow.       Past Medical History  Diagnosis Date  . Hypertension     followed by [facility] (Dr Dani Gobble Croitoru)  . Hyperlipidemia   . Cancer     hx of prostate; s/p radioactive seed implant 10/2009 Dr Janice Norrie  . 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 History of cardiac cath 2004    negative for CAD  . History of myocardial perfusion scan 02/2010    negative for coronary insufficiency (LVEF 27%)    History   Social History  . Marital Status: Married    Spouse Name: N/A    Number of Children: 2  . Years of Education: N/A   Occupational History  . retired    Social History Main Topics  . Smoking status: Former Smoker    Quit date: 12/04/2005  . Smokeless tobacco: Never Used  . Alcohol Use: Yes  . Drug Use: Not on file  . Sexually Active: Not on file   Other Topics Concern  . Not on file   Social History Narrative   Stays active at homeRegular exercise: no    Past Surgical History  Procedure Date  . Radioactive seed implant 2010    prostate cancer    Family History  Problem Relation Age of Onset  . Hypertension Mother   . Thyroid disease Mother   . Cholelithiasis Daughter   . Cholelithiasis Son   . Hypertension Maternal Grandmother   . Diabetes Maternal Grandmother     No Known Allergies  Current Outpatient Prescriptions on File Prior to Visit  Medication Sig Dispense Refill  . aspirin 325 MG EC tablet Take 325 mg by mouth daily.        . carvedilol (COREG) 25 MG tablet  Take 25 mg by mouth 2 (two) times daily.        . finasteride (PROSCAR) 5 MG tablet Take 5 mg by mouth daily.        Marland Kitchen losartan-hydrochlorothiazide (HYZAAR) 100-25 MG per tablet Take 1 tablet by mouth daily.        Marland Kitchen spironolactone (ALDACTONE) 25 MG tablet Take 25 mg by mouth daily.        Marland Kitchen terazosin (HYTRIN) 10 MG capsule Take 10 mg by mouth at bedtime.        . simvastatin (ZOCOR) 20 MG tablet Take 20 mg by mouth at bedtime.          BP 122/76  Pulse 66  Temp(Src) 98 F (36.7 C) (Oral)  Resp 16  Ht 6' 1.5" (1.867 m)  Wt 228 lb (103.42 kg)  BMI 29.67 kg/m2    Objective:   Physical Exam  Constitutional: He appears well-developed and well-nourished.  HENT:  Head: Normocephalic and atraumatic.  Cardiovascular: Normal rate and regular rhythm.   Pulmonary/Chest: Effort normal and breath sounds normal.  Psychiatric: He has a normal mood and affect. His behavior is normal.          Assessment & Plan:

## 2011-03-30 DIAGNOSIS — R5383 Other fatigue: Secondary | ICD-10-CM | POA: Insufficient documentation

## 2011-03-30 NOTE — Assessment & Plan Note (Signed)
BP: 122/76 mmHg   BP is stable, continue same.

## 2011-03-30 NOTE — Assessment & Plan Note (Signed)
Check laboratories as below-  Need to rule out hypothyroid and anemia.

## 2011-03-31 ENCOUNTER — Telehealth: Payer: Self-pay | Admitting: *Deleted

## 2011-03-31 LAB — FOLATE: Folate: 7.7 ng/mL

## 2011-03-31 NOTE — Telephone Encounter (Signed)
Spoke to [place] at [facility] and added tests with dx 285.9.

## 2011-03-31 NOTE — Telephone Encounter (Signed)
Message copied by Kelle Darting on Fri Mar 31, 2011 11:53 AM ------      Message from: O'SULLIVAN, MELISSA      Created: Thu Mar 30, 2011  8:48 PM       Could you pls call lab and ask them to add on B12, iron, folate, ferrritin. diag- Anemia

## 2011-04-02 ENCOUNTER — Telehealth: Payer: Self-pay | Admitting: Family

## 2011-04-02 DIAGNOSIS — D539 Nutritional anemia, unspecified: Secondary | ICD-10-CM | POA: Insufficient documentation

## 2011-04-02 DIAGNOSIS — D649 Anemia, unspecified: Secondary | ICD-10-CM

## 2011-04-02 NOTE — Telephone Encounter (Signed)
Please call patient and let him know that he is mildly anemic.  I would like for him to complete an ifob kit- diagnosis- anemia.

## 2011-04-03 NOTE — Telephone Encounter (Signed)
Pt notified and states he will come by Wednesday morning to pick up kit.

## 2011-04-05 ENCOUNTER — Encounter: Payer: Self-pay | Admitting: *Deleted

## 2011-04-05 LAB — LIPID PANEL
LDL Cholesterol: 92 mg/dL (ref 0–99)
Triglycerides: 83 mg/dL (ref <150)
VLDL: 17 mg/dL (ref 0–40)

## 2011-04-05 NOTE — Telephone Encounter (Signed)
Pt picked up IFOB kit and questions were answered. Pt voices understanding of instructions. Letter faxed to the lab.

## 2011-04-07 ENCOUNTER — Encounter: Payer: Self-pay | Admitting: Family

## 2011-04-18 NOTE — Op Note (Signed)
NAME:  David [name], David [name] NO.:  [id number]   MEDICAL RECORD NO.:  [id number]          PATIENT TYPE:  AMB   LOCATION:  [facility]                         FACILITY:  [facility]   PHYSICIAN:  [name], M.D.  DATE OF BIRTH:  1941/06/14   DATE OF PROCEDURE:  05/22/2008  DATE OF DISCHARGE:                               OPERATIVE REPORT   PREOPERATIVE DIAGNOSIS:  Elevated prostate-specific antigen.   POSTOPERATIVE DIAGNOSIS:  Elevated prostate-specific antigen.   PROCEDURE DONE:  Saturation prostate biopsy.   SURGEON:  Barb Merino, M.D.   ANESTHESIA:  General.   INDICATION:  The patient is 70 year old who had three negative prostate  biopsies in the past.  His PSA has continued to rise.  He is scheduled  now for a saturation prostate biopsy.   Under general anesthesia the patient was prepped and draped and placed  in the dorsal lithotomy position.  The ultrasound probe was then  inserted in the rectum and then the grid was attached to the transducer  and under ultrasound guidance, a total of 24 biopsies of the prostate  were done, covering the whole prostate length and width from the apex to  the base.  The transducer was then removed.   The patient tolerated the procedure well and left the OR in satisfactory  condition to post anesthesia care unit.      [name], M.D.  Electronically Signed     MN/MEDQ  D:  05/22/2008  T:  05/22/2008  Job:  [id number]

## 2011-04-26 ENCOUNTER — Other Ambulatory Visit: Payer: Medicare PPO

## 2011-04-26 ENCOUNTER — Other Ambulatory Visit (INDEPENDENT_AMBULATORY_CARE_PROVIDER_SITE_OTHER): Payer: Medicare PPO | Admitting: Family

## 2011-04-26 ENCOUNTER — Encounter: Payer: Self-pay | Admitting: Family

## 2011-04-26 DIAGNOSIS — Z1211 Encounter for screening for malignant neoplasm of colon: Secondary | ICD-10-CM

## 2011-04-26 LAB — FECAL OCCULT BLOOD, IMMUNOCHEMICAL: Fecal Occult Bld: NEGATIVE

## 2011-06-27 ENCOUNTER — Ambulatory Visit (INDEPENDENT_AMBULATORY_CARE_PROVIDER_SITE_OTHER): Payer: Medicare PPO | Admitting: Family

## 2011-06-27 ENCOUNTER — Encounter: Payer: Self-pay | Admitting: Family

## 2011-06-27 VITALS — BP 95/60 | HR 71 | Temp 97.8°F | Resp 16 | Ht 73.5 in | Wt 222.1 lb

## 2011-06-27 DIAGNOSIS — D649 Anemia, unspecified: Secondary | ICD-10-CM

## 2011-06-27 DIAGNOSIS — R42 Dizziness and giddiness: Secondary | ICD-10-CM

## 2011-06-27 DIAGNOSIS — I10 Essential (primary) hypertension: Secondary | ICD-10-CM

## 2011-06-27 LAB — CBC WITH DIFFERENTIAL/PLATELET
Basophils Absolute: 0 10*3/uL (ref 0.0–0.1)
Basophils Relative: 1 % (ref 0–1)
Eosinophils Absolute: 0.3 10*3/uL (ref 0.0–0.7)
Eosinophils Relative: 5 % (ref 0–5)
HCT: 37.2 % — ABNORMAL LOW (ref 39.0–52.0)
Hemoglobin: 11.8 g/dL — ABNORMAL LOW (ref 13.0–17.0)
Lymphocytes Relative: 21 % (ref 12–46)
Lymphs Abs: 1 10*3/uL (ref 0.7–4.0)
MCH: 30.6 pg (ref 26.0–34.0)
MCHC: 31.7 g/dL (ref 30.0–36.0)
MCV: 96.4 fL (ref 78.0–100.0)
Monocytes Absolute: 0.5 10*3/uL (ref 0.1–1.0)
Monocytes Relative: 11 % (ref 3–12)
Neutro Abs: 3 10*3/uL (ref 1.7–7.7)
Neutrophils Relative %: 62 % (ref 43–77)
Platelets: 258 10*3/uL (ref 150–400)
RBC: 3.86 MIL/uL — ABNORMAL LOW (ref 4.22–5.81)
RDW: 13.6 % (ref 11.5–15.5)
WBC: 4.9 10*3/uL (ref 4.0–10.5)

## 2011-06-27 NOTE — Progress Notes (Signed)
Subjective:    Patient ID: David [name], male    DOB: 15-Feb-1941, 70 y.o.   MRN: [id number]  HPI  Near syncope- notes that this started about 1 week ago.  Feels dizzy upon standing when he is bent over.    Back pain- notes that he has been using tylenol PRN.  This started hurting last week.  Left low back.  Symptoms improved.    Fatigue- Notes that he legs become fatigued, but denies pain with walking.    Anemia- noted to have mild anemia last visit.  Anemia panel was normal.  Last colonoscopy was 2-3 months.    Bilateral hand numbness while sleeping.  Reports that he works with his hands Review of Systems See HPI  Past Medical History  Diagnosis Date  . Hypertension     followed by [facility] (Dr Dani Gobble Croitoru)  . Hyperlipidemia   . Cancer     hx of prostate; s/p radioactive seed implant 10/2009 Dr Janice Norrie  . 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 History of cardiac cath 2004    negative for CAD  . History of myocardial perfusion scan 02/2010    negative for coronary insufficiency (LVEF 27%)    History   Social History  . Marital Status: Married    Spouse Name: N/A    Number of Children: 2  . Years of Education: N/A   Occupational History  . retired    Social History Main Topics  . Smoking status: Former Smoker    Quit date: 12/04/2005  . Smokeless tobacco: Never Used  . Alcohol Use: Yes  . Drug Use: Not on file  . Sexually Active: Not on file   Other Topics Concern  . Not on file   Social History Narrative   Stays active at homeRegular exercise: no    Past Surgical History  Procedure Date  . Radioactive seed implant 2010    prostate cancer    Family History  Problem Relation Age of Onset  . Hypertension Mother   . Thyroid disease Mother   . Cholelithiasis Daughter   . Cholelithiasis Son   . Hypertension Maternal Grandmother   . Diabetes Maternal Grandmother      No Known Allergies  Current Outpatient Prescriptions on File Prior to Visit  Medication Sig Dispense Refill  . aspirin 325 MG EC tablet Take 325 mg by mouth daily.        . carvedilol (COREG) 25 MG tablet Take 25 mg by mouth 2 (two) times daily.        . finasteride (PROSCAR) 5 MG tablet Take 5 mg by mouth daily.        Marland Kitchen losartan-hydrochlorothiazide (HYZAAR) 100-25 MG per tablet Take 1 tablet by mouth daily.        Marland Kitchen spironolactone (ALDACTONE) 25 MG tablet Take 25 mg by mouth daily.        Marland Kitchen terazosin (HYTRIN) 10 MG capsule Take 10 mg by mouth at bedtime.          BP 95/60  Pulse 71  Temp(Src) 97.8 F (36.6 C) (Oral)  Resp 16  Ht 6' 1.5" (1.867 m)  Wt 222 lb 1.9 oz (100.753 kg)  BMI 28.91 kg/m2  SpO2 99%       Objective:   Physical Exam  Constitutional: He appears well-developed and well-nourished.  HENT:  Head: Normocephalic and atraumatic.  Eyes: Conjunctivae are normal.  Cardiovascular: Normal rate and regular rhythm.   Pulmonary/Chest: Effort normal and breath sounds normal. No respiratory distress. He has no wheezes. He has no rales. He exhibits no tenderness.  Musculoskeletal: He exhibits no edema.  Psychiatric: He has a normal mood and affect. His behavior is normal. Judgment and thought content normal.          Assessment & Plan:

## 2011-06-27 NOTE — Patient Instructions (Signed)
Complete your lab work on the first floor.  Call if you develop recurrent dizziness after you cut your losartan-HCTZ in half.   Follow up in 2 weeks.

## 2011-06-28 DIAGNOSIS — R42 Dizziness and giddiness: Secondary | ICD-10-CM | POA: Insufficient documentation

## 2011-06-28 DIAGNOSIS — D509 Iron deficiency anemia, unspecified: Secondary | ICD-10-CM | POA: Insufficient documentation

## 2011-06-28 NOTE — Assessment & Plan Note (Signed)
Will repeat CBC today.  Need to make sure that anemia is not contributing to his dizziness.

## 2011-06-28 NOTE — Assessment & Plan Note (Signed)
I suspect that his dizziness is due to overtreated hypertension and some associated orthostasis.  I recommended that he cut back his hyzaar to 1/2 tablet once daily.

## 2011-07-02 ENCOUNTER — Telehealth: Payer: Self-pay | Admitting: Family

## 2011-07-02 DIAGNOSIS — D649 Anemia, unspecified: Secondary | ICD-10-CM

## 2011-07-02 NOTE — Telephone Encounter (Signed)
Pls call patient and let him know that he is still anemic.  I would like for him to meet with Dr. Marin Olp (hematology) for consultation to discuss cause of his anemia as his other tests have been normal (IFOB, b12,folate, iron).

## 2011-07-03 NOTE — Telephone Encounter (Signed)
Pt.notified

## 2011-07-11 ENCOUNTER — Encounter: Payer: Self-pay | Admitting: Family

## 2011-07-11 ENCOUNTER — Ambulatory Visit (INDEPENDENT_AMBULATORY_CARE_PROVIDER_SITE_OTHER): Payer: Medicare PPO | Admitting: Family

## 2011-07-11 DIAGNOSIS — D649 Anemia, unspecified: Secondary | ICD-10-CM

## 2011-07-11 DIAGNOSIS — I10 Essential (primary) hypertension: Secondary | ICD-10-CM

## 2011-07-11 NOTE — Assessment & Plan Note (Signed)
Dizziness has resolved with decrease in Hyzaar.  BP is stable.  Continue current dose.

## 2011-07-11 NOTE — Assessment & Plan Note (Signed)
Will check on status of patient's apt with hematology.

## 2011-07-11 NOTE — Patient Instructions (Signed)
Please follow up in 3 months.  

## 2011-07-11 NOTE — Progress Notes (Signed)
Subjective:    Patient ID: David [name], male    DOB: 02/19/1941, 70 y.o.   MRN: [id number]  HPI  Mr.  Gottschall is a 70 yr old male who presents today for follow up.   HTN- last visit he complained of dizziness, especially with standing.  His Hyzaar dose was decreased.  Since that time he reports feeling much better, symptoms are resolved.    HA- none today.  Has had some head aches, frontal, throbbing, resolve on own.  Mild per patient.    Anemia- he is awaiting an appointment with Dr. Marin Olp, has not heard from their office re: apt.   Review of Systems    see HPI  Past Medical History  Diagnosis Date  . Hypertension     followed by [facility] (Dr Dani Gobble Croitoru)  . Hyperlipidemia   . Cancer     hx of prostate; s/p radioactive seed implant 10/2009 Dr Janice Norrie  . 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 History of cardiac cath 2004    negative for CAD  . History of myocardial perfusion scan 02/2010    negative for coronary insufficiency (LVEF 27%)    History   Social History  . Marital Status: Married    Spouse Name: N/A    Number of Children: 2  . Years of Education: N/A   Occupational History  . retired    Social History Main Topics  . Smoking status: Former Smoker    Quit date: 12/04/2005  . Smokeless tobacco: Never Used  . Alcohol Use: Yes  . Drug Use: Not on file  . Sexually Active: Not on file   Other Topics Concern  . Not on file   Social History Narrative   Stays active at homeRegular exercise: no    Past Surgical History  Procedure Date  . Radioactive seed implant 2010    prostate cancer    Family History  Problem Relation Age of Onset  . Hypertension Mother   . Thyroid disease Mother   . Cholelithiasis Daughter   . Cholelithiasis Son   . Hypertension Maternal Grandmother   . Diabetes Maternal Grandmother     No Known Allergies  Current Outpatient  Prescriptions on File Prior to Visit  Medication Sig Dispense Refill  . aspirin 325 MG EC tablet Take 325 mg by mouth daily.        . carvedilol (COREG) 25 MG tablet Take 25 mg by mouth 2 (two) times daily.        . finasteride (PROSCAR) 5 MG tablet Take 5 mg by mouth daily.        Marland Kitchen losartan-hydrochlorothiazide (HYZAAR) 100-25 MG per tablet Take 0.5 tablets by mouth daily.       Marland Kitchen spironolactone (ALDACTONE) 25 MG tablet Take 25 mg by mouth daily.        Marland Kitchen terazosin (HYTRIN) 10 MG capsule Take 10 mg by mouth at bedtime.          BP 116/74  Pulse 60  Temp(Src) 97.8 F (36.6 C) (Oral)  Resp 16  Ht 6' 1.5" (1.867 m)  Wt 226 lb 0.6 oz (102.531 kg)  BMI 29.42 kg/m2    Objective:   Physical Exam  Constitutional: He is oriented to person, place, and time. He appears well-developed and well-nourished.  HENT:  Head: Normocephalic and atraumatic.  Cardiovascular: Normal rate and regular rhythm.   Pulmonary/Chest: Effort normal and  breath sounds normal.  Abdominal: Soft. Bowel sounds are normal.  Musculoskeletal: Normal range of motion. He exhibits no edema.  Neurological: He is alert and oriented to person, place, and time.          Assessment & Plan:

## 2011-07-18 ENCOUNTER — Ambulatory Visit: Payer: Medicare PPO | Admitting: Hematology & Oncology

## 2011-08-16 ENCOUNTER — Other Ambulatory Visit: Payer: Self-pay | Admitting: Hematology & Oncology

## 2011-08-16 ENCOUNTER — Ambulatory Visit (HOSPITAL_BASED_OUTPATIENT_CLINIC_OR_DEPARTMENT_OTHER): Payer: Medicare PPO | Admitting: Hematology & Oncology

## 2011-08-16 DIAGNOSIS — D638 Anemia in other chronic diseases classified elsewhere: Secondary | ICD-10-CM

## 2011-08-16 DIAGNOSIS — E559 Vitamin D deficiency, unspecified: Secondary | ICD-10-CM

## 2011-08-16 DIAGNOSIS — I10 Essential (primary) hypertension: Secondary | ICD-10-CM

## 2011-08-16 DIAGNOSIS — Z8546 Personal history of malignant neoplasm of prostate: Secondary | ICD-10-CM

## 2011-08-16 DIAGNOSIS — C61 Malignant neoplasm of prostate: Secondary | ICD-10-CM

## 2011-08-16 DIAGNOSIS — D649 Anemia, unspecified: Secondary | ICD-10-CM

## 2011-08-16 LAB — CBC WITH DIFFERENTIAL (CANCER CENTER ONLY)
EOS%: 7.1 % — ABNORMAL HIGH (ref 0.0–7.0)
LYMPH%: 22.1 % (ref 14.0–48.0)
MCH: 32.2 pg (ref 28.0–33.4)
MCHC: 34.3 g/dL (ref 32.0–35.9)
MONO%: 9.1 % (ref 0.0–13.0)
NEUT#: 2.7 10*3/uL (ref 1.5–6.5)
Platelets: 198 10*3/uL (ref 145–400)
RBC: 3.48 10*6/uL — ABNORMAL LOW (ref 4.20–5.70)
RDW: 13.4 % (ref 11.1–15.7)

## 2011-08-16 LAB — CHCC SATELLITE - SMEAR

## 2011-08-18 LAB — COMPREHENSIVE METABOLIC PANEL
ALT: 18 U/L (ref 0–53)
AST: 17 U/L (ref 0–37)
CO2: 23 mEq/L (ref 19–32)
Chloride: 108 mEq/L (ref 96–112)
Creatinine, Ser: 1.79 mg/dL — ABNORMAL HIGH (ref 0.50–1.35)
Sodium: 142 mEq/L (ref 135–145)
Total Bilirubin: 0.4 mg/dL (ref 0.3–1.2)
Total Protein: 7.5 g/dL (ref 6.0–8.3)

## 2011-08-18 LAB — TESTOSTERONE: Testosterone: 256.34 ng/dL (ref 250–890)

## 2011-08-18 LAB — IRON AND TIBC
%SAT: 12 % — ABNORMAL LOW (ref 20–55)
TIBC: 259 ug/dL (ref 215–435)

## 2011-08-18 LAB — PROTEIN ELECTROPHORESIS, SERUM
Beta Globulin: 4.9 % (ref 4.7–7.2)
Total Protein, Serum Electrophoresis: 7.5 g/dL (ref 6.0–8.3)

## 2011-08-18 LAB — RETICULOCYTES (CHCC)
ABS Retic: 43.1 10*3/uL (ref 19.0–186.0)
RBC.: 3.59 MIL/uL — ABNORMAL LOW (ref 4.22–5.81)

## 2011-08-18 LAB — ERYTHROPOIETIN: Erythropoietin: 27.4 m[IU]/mL (ref 2.6–34.0)

## 2011-08-18 LAB — VITAMIN B12: Vitamin B-12: 547 pg/mL (ref 211–911)

## 2011-08-18 LAB — PSA: PSA: 0.01 ng/mL (ref <=4.00)

## 2011-08-23 ENCOUNTER — Ambulatory Visit (INDEPENDENT_AMBULATORY_CARE_PROVIDER_SITE_OTHER): Payer: Medicare PPO | Admitting: Family

## 2011-08-23 ENCOUNTER — Encounter (HOSPITAL_BASED_OUTPATIENT_CLINIC_OR_DEPARTMENT_OTHER): Payer: Medicare PPO | Admitting: Hematology & Oncology

## 2011-08-23 DIAGNOSIS — Z23 Encounter for immunization: Secondary | ICD-10-CM

## 2011-08-23 DIAGNOSIS — Z2911 Encounter for prophylactic immunotherapy for respiratory syncytial virus (RSV): Secondary | ICD-10-CM

## 2011-08-23 DIAGNOSIS — D649 Anemia, unspecified: Secondary | ICD-10-CM

## 2011-08-31 LAB — BASIC METABOLIC PANEL
Calcium: 9.1
GFR calc Af Amer: >60
GFR calc non Af Amer: 54 — ABNORMAL LOW
Glucose, Bld: 95
Sodium: 138

## 2011-08-31 LAB — POCT HEMOGLOBIN-HEMACUE
Hemoglobin: 12.4 — ABNORMAL LOW
Operator id: 268271

## 2011-10-10 ENCOUNTER — Encounter: Payer: Self-pay | Admitting: Family

## 2011-10-10 ENCOUNTER — Ambulatory Visit (INDEPENDENT_AMBULATORY_CARE_PROVIDER_SITE_OTHER): Payer: Medicare PPO | Admitting: Family

## 2011-10-10 DIAGNOSIS — C61 Malignant neoplasm of prostate: Secondary | ICD-10-CM

## 2011-10-10 DIAGNOSIS — D649 Anemia, unspecified: Secondary | ICD-10-CM

## 2011-10-10 DIAGNOSIS — I10 Essential (primary) hypertension: Secondary | ICD-10-CM

## 2011-10-10 DIAGNOSIS — G459 Transient cerebral ischemic attack, unspecified: Secondary | ICD-10-CM

## 2011-10-10 LAB — CBC WITH DIFFERENTIAL/PLATELET
HCT: 37.3 % — ABNORMAL LOW (ref 39.0–52.0)
Hemoglobin: 12.1 g/dL — ABNORMAL LOW (ref 13.0–17.0)
Lymphs Abs: 0.9 10*3/uL (ref 0.7–4.0)
MCHC: 32.4 g/dL (ref 30.0–36.0)
Monocytes Absolute: 0.4 10*3/uL (ref 0.1–1.0)
Monocytes Relative: 10 % (ref 3–12)
Neutro Abs: 2.2 10*3/uL (ref 1.7–7.7)
Neutrophils Relative %: 57 % (ref 43–77)
RBC: 3.92 MIL/uL — ABNORMAL LOW (ref 4.22–5.81)

## 2011-10-10 LAB — BASIC METABOLIC PANEL
CO2: 25 mEq/L (ref 19–32)
Calcium: 9 mg/dL (ref 8.4–10.5)
Chloride: 102 mEq/L (ref 96–112)
Creat: 1.56 mg/dL — ABNORMAL HIGH (ref 0.50–1.35)
Sodium: 136 mEq/L (ref 135–145)

## 2011-10-10 NOTE — Assessment & Plan Note (Signed)
No urinary complaints today. This is being managed/followed by Dr. Janice Norrie.

## 2011-10-10 NOTE — Assessment & Plan Note (Signed)
This was evaluated by Dr. Marin Olp and felt likely to be related to his hx of hypertension and probably low erythropoietin levels.  He did not recommend any medication changes.  Check CBC today.

## 2011-10-10 NOTE — Assessment & Plan Note (Signed)
BP is stable on aldactone, hyzaar and coreg. Continue same.

## 2011-10-10 NOTE — Assessment & Plan Note (Signed)
Continue aspirin, close monitoring of BP control. Lipids were stable back in May.

## 2011-10-10 NOTE — Progress Notes (Signed)
Subjective:    Patient ID: David [name], male    DOB: [birth year], 70 y.o.   MRN: [id number]  HPI Mr.  [name] is a 70 yr old male who presents today for follow up.  1. Anemia- sees David [name].  He has an upcoming apt with David [name] in December.  2. HTN- Denies chest pain, shortness of breath or swelling. + compliance with meds.    3. Dizziness-  Reports that this is resolved.  4. Notes that his lids are "heavy".  This has been going on for 2 months.  He is seeing David [name].  He sees him for follow up in February.  5. Prostate cancer- He continues with David [name].  Denies any urinary complaints.   Review of Systems See HPI  Past Medical History  Diagnosis Date  . Hypertension     followed by [facility] V[facility] (David [name])  . Hyperlipidemia   . Cancer     hx of prostate; s/p radioactive seed implant 10/2009 David Janice [name]  . 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 History of cardiac cath 2004    negative for CAD  . History of myocardial perfusion scan 02/2010    negative for coronary insufficiency (LVEF 27%)    History   Social History  . Marital Status: Married    Spouse Name: N/A    Number of Children: 2  . Years of Education: N/A   Occupational History  . retired    Social History Main Topics  . Smoking status: Former Smoker    Quit date: 12/04/2005  . Smokeless tobacco: Never Used  . Alcohol Use: Yes  . Drug Use: Not on file  . Sexually Active: Not on file   Other Topics Concern  . Not on file   Social History Narrative   Stays active at homeRegular exercise: no    Past Surgical History  Procedure Date  . Radioactive seed implant 2010    prostate cancer    Family History  Problem Relation Age of Onset  . Hypertension Mother   . Thyroid disease Mother   . Cholelithiasis Daughter   . Cholelithiasis Son   . Hypertension Maternal Grandmother   . Diabetes Maternal  Grandmother     No Known Allergies  Current Outpatient Prescriptions on File Prior to Visit  Medication Sig Dispense Refill  . aspirin 325 MG EC tablet Take 325 mg by mouth daily.        . carvedilol (COREG) 25 MG tablet Take 25 mg by mouth 2 (two) times daily.        . finasteride (PROSCAR) 5 MG tablet Take 5 mg by mouth daily.        Marland Kitchen spironolactone (ALDACTONE) 25 MG tablet Take 25 mg by mouth daily.        Marland Kitchen terazosin (HYTRIN) 10 MG capsule Take 10 mg by mouth at bedtime.          BP 104/72  Pulse 66  Temp(Src) 97.8 F (36.6 C) (Oral)  Resp 16  Ht 6' 1.5" (1.867 m)  Wt 228 lb (103.42 kg)  BMI 29.67 kg/m2       Objective:   Physical Exam  Constitutional: He appears well-developed and well-nourished. No distress.  Cardiovascular: Normal rate and regular rhythm.   No murmur heard. Pulmonary/Chest: Effort normal and breath sounds normal. No respiratory distress. He has no wheezes. He has no rales.  He exhibits no tenderness.  Musculoskeletal: He exhibits no edema.  Skin: Skin is warm and dry.  Psychiatric: He has a normal mood and affect. His behavior is normal. Judgment and thought content normal.          Assessment & Plan:

## 2011-10-10 NOTE — Patient Instructions (Signed)
Please complete your lab work prior to leaving today. Keep your follow up apt with Dr. Marin Olp. Follow up in 3 months.

## 2011-11-15 ENCOUNTER — Other Ambulatory Visit (HOSPITAL_BASED_OUTPATIENT_CLINIC_OR_DEPARTMENT_OTHER): Payer: Medicare PPO | Admitting: Lab

## 2011-11-15 ENCOUNTER — Ambulatory Visit (HOSPITAL_BASED_OUTPATIENT_CLINIC_OR_DEPARTMENT_OTHER): Payer: Medicare PPO | Admitting: Hematology & Oncology

## 2011-11-15 ENCOUNTER — Telehealth: Payer: Self-pay | Admitting: *Deleted

## 2011-11-15 ENCOUNTER — Other Ambulatory Visit: Payer: Self-pay | Admitting: Hematology & Oncology

## 2011-11-15 ENCOUNTER — Encounter: Payer: Self-pay | Admitting: Hematology & Oncology

## 2011-11-15 VITALS — BP 116/68 | HR 61 | Temp 98.1°F | Ht 73.5 in | Wt 229.0 lb

## 2011-11-15 DIAGNOSIS — C61 Malignant neoplasm of prostate: Secondary | ICD-10-CM

## 2011-11-15 DIAGNOSIS — D649 Anemia, unspecified: Secondary | ICD-10-CM

## 2011-11-15 DIAGNOSIS — D509 Iron deficiency anemia, unspecified: Secondary | ICD-10-CM

## 2011-11-15 HISTORY — DX: Iron deficiency anemia, unspecified: D50.9

## 2011-11-15 LAB — IRON AND TIBC
%SAT: 22 % (ref 20–55)
TIBC: 271 ug/dL (ref 215–435)
UIBC: 211 ug/dL (ref 125–400)

## 2011-11-15 LAB — CBC WITH DIFFERENTIAL (CANCER CENTER ONLY)
BASO#: 0 10*3/uL (ref 0.0–0.2)
Eosinophils Absolute: 0.3 10*3/uL (ref 0.0–0.5)
HCT: 35.4 % — ABNORMAL LOW (ref 38.7–49.9)
HGB: 11.8 g/dL — ABNORMAL LOW (ref 13.0–17.1)
LYMPH%: 24.4 % (ref 14.0–48.0)
MCH: 31.5 pg (ref 28.0–33.4)
MCV: 94 fL (ref 82–98)
MONO#: 0.5 10*3/uL (ref 0.1–0.9)
MONO%: 12.2 % (ref 0.0–13.0)
NEUT%: 54.5 % (ref 40.0–80.0)
Platelets: 206 10*3/uL (ref 145–400)
RBC: 3.75 10*6/uL — ABNORMAL LOW (ref 4.20–5.70)
WBC: 3.9 10*3/uL — ABNORMAL LOW (ref 4.0–10.0)

## 2011-11-15 LAB — CHCC SATELLITE - SMEAR

## 2011-11-15 LAB — RETICULOCYTES (CHCC): RBC.: 3.75 MIL/uL — ABNORMAL LOW (ref 4.22–5.81)

## 2011-11-15 NOTE — Progress Notes (Signed)
This office note has been dictated.

## 2011-11-15 NOTE — Telephone Encounter (Signed)
Mailed 3-13 schedule

## 2011-11-16 NOTE — Progress Notes (Signed)
CC:   [name], NP [name], M.D. [name], MD  DIAGNOSES: 1. 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 2. Stage I adenocarcinoma of the prostate, clinical remission. 3. Cardiomyopathy. 4. Renal insufficiency.  CURRENT THERAPY:  The patient is status post IV iron on 08/23/2011.  INTERIM HISTORY:  Mr. Viviani comes in for his second office visit.  We first saw him back on 08/16/2011.  At that point in time, he had mild anemia.  We did a workup.  His iron saturation was 112%.  Ferritin was 315.  His erythropoietin level was only 27.  Testosterone was 256. There is no monoclonal spike in his serum.  His PSA was normal.  We did give a dose of IV iron with the Feraheme.  He responded nicely to this.  He feels a little bit better.  He is not having as much fatigue.  He says his legs still give out a little bit when he walks uphill.  He is not complaining of any chest pain.  There is no cough.  He has not noted any change in bowel or bladder habits.  There has been no fever, sweats or chills.  PHYSICAL EXAMINATION:  General Appearance:  This is a fairly well- developed, well-nourished black gentleman in no obvious distress.  Vital Signs:  98.1, pulse 61, respiratory rate 18, blood pressure 116/68. Weight is 229.  Head and Neck Exam:  Shows a normocephalic, atraumatic skull.  There are no ocular or oral lesions.  There are no palpable cervical or supraclavicular lymph nodes.  Lungs:  Clear bilaterally. Cardiac Exam:  Regular rate and rhythm with a normal S1 and S2.  There are no murmurs, rubs or bruits.  Abdominal Exam:  Soft with good bowel sounds.  There is no palpable abdominal mass.  There is no fluid wave. There is no palpable hepatosplenomegaly.  Extremities:  Show no clubbing, cyanosis or edema.  Neurological Exam:  Shows no focal neurological deficits.  LABORATORY STUDIES:  White cell count is 3.9, hemoglobin  11.8, hematocrit 35.4, platelet count 206.  MCV is 94.  IMPRESSION:  Mr. Bongo is a 70 year old African American gentleman with multifactorial anemia.  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  Testosterone really helps drive bone marrow red cell production.  Unfortunately, with his recent diagnosis of prostate cancer, I would not recommend supplemental androgen therapy.  We will recheck his iron studies.  It is possible he may need another dose of IV iron.  What we did do back in September has helped.  His blood count is higher.  Given that his erythropoietin level is on the low side, Aranesp/Procrit also could be an option if his blood counts drop.  I do want to see Mr. Thoroughman back in another 2 or 3 months so that we can maintain close follow-up on his blood counts/anemia.    ______________________________ Volanda Napoleon, M.D. PRE/MEDQ  D:  11/15/2011  T:  11/16/2011  Job:  [id number]

## 2011-12-29 ENCOUNTER — Telehealth: Payer: Self-pay | Admitting: Family

## 2011-12-29 ENCOUNTER — Ambulatory Visit (INDEPENDENT_AMBULATORY_CARE_PROVIDER_SITE_OTHER): Payer: Medicare PPO | Admitting: Family

## 2011-12-29 ENCOUNTER — Encounter: Payer: Self-pay | Admitting: Family

## 2011-12-29 ENCOUNTER — Ambulatory Visit (HOSPITAL_BASED_OUTPATIENT_CLINIC_OR_DEPARTMENT_OTHER)
Admission: RE | Admit: 2011-12-29 | Discharge: 2011-12-29 | Disposition: A | Discharge status: Home / Self Care | Payer: Medicare PPO | Source: Ambulatory Visit | Attending: Family | Admitting: Family

## 2011-12-29 VITALS — BP 140/90 | HR 72 | Temp 97.9°F | Resp 16 | Wt 231.0 lb

## 2011-12-29 DIAGNOSIS — M25559 Pain in unspecified hip: Secondary | ICD-10-CM

## 2011-12-29 DIAGNOSIS — M25552 Pain in left hip: Secondary | ICD-10-CM

## 2011-12-29 DIAGNOSIS — M169 Osteoarthritis of hip, unspecified: Secondary | ICD-10-CM | POA: Insufficient documentation

## 2011-12-29 MED ORDER — MELOXICAM 7.5 MG PO TABS: 7.5000 mg | ORAL_TABLET | Freq: Every day | ORAL | Status: DC

## 2011-12-29 NOTE — Progress Notes (Signed)
Subjective:    Patient ID: David [name], male    DOB: 05/24/1941, 71 y.o.   MRN: [id number]  HPI  Mr.  Mallick is a 71 yr old male who presents today with chief complaint of Left hip pain.  Symptoms started around Christmas.  He reports that at that time his right knee had been acting up and he had been favoring his left side.  He has tried [employer] which has not helped.  Pain is constant and unchanged- like a toothache. Pain is described as moderate. Pain improves with certain movements but never goes away.      Review of Systems See HPI  Past Medical History  Diagnosis Date  . Hypertension     followed by [facility] (Dr Dani Gobble Croitoru)  . Hyperlipidemia   . Cancer     hx of prostate; s/p radioactive seed implant 10/2009 Dr Janice Norrie  . 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 History of cardiac cath 2004    negative for CAD  . History of myocardial perfusion scan 02/2010    negative for coronary insufficiency (LVEF 27%)  . Anemia, iron deficiency 11/15/2011    History   Social History  . Marital Status: Married    Spouse Name: N/A    Number of Children: 2  . Years of Education: N/A   Occupational History  . retired    Social History Main Topics  . Smoking status: Former Smoker    Quit date: 12/04/2005  . Smokeless tobacco: Never Used  . Alcohol Use: Yes  . Drug Use: Not on file  . Sexually Active: Not on file   Other Topics Concern  . Not on file   Social History Narrative   Stays active at homeRegular exercise: no    Past Surgical History  Procedure Date  . Radioactive seed implant 2010    prostate cancer    Family History  Problem Relation Age of Onset  . Hypertension Mother   . Thyroid disease Mother   . Cholelithiasis Daughter   . Cholelithiasis Son   . Hypertension Maternal Grandmother   . Diabetes Maternal Grandmother     No Known Allergies  Current Outpatient  Prescriptions on File Prior to Visit  Medication Sig Dispense Refill  . aspirin 325 MG EC tablet Take 325 mg by mouth daily.        . carvedilol (COREG) 25 MG tablet Take 25 mg by mouth 2 (two) times daily.        . finasteride (PROSCAR) 5 MG tablet Take 5 mg by mouth daily.        Marland Kitchen losartan-hydrochlorothiazide (HYZAAR) 100-12.5 MG per tablet Take 1 tablet by mouth daily.        Marland Kitchen spironolactone (ALDACTONE) 25 MG tablet Take 25 mg by mouth daily.        Marland Kitchen terazosin (HYTRIN) 10 MG capsule Take 10 mg by mouth at bedtime.          BP 140/90  Pulse 72  Temp(Src) 97.9 F (36.6 C) (Oral)  Resp 16  Wt 231 lb (104.781 kg)       Objective:   Physical Exam  Constitutional: He appears well-developed and well-nourished. No distress.  Cardiovascular: Normal rate and regular rhythm.   No murmur heard. Pulmonary/Chest: Effort normal and breath sounds normal. No respiratory distress. He has no wheezes. He has no rales. He exhibits no tenderness.  Musculoskeletal:  He exhibits no edema.       Full ROM of left hip.  Some increased pain with passive movement of the left hip.  Psychiatric: He has a normal mood and affect. His behavior is normal. Judgment and thought content normal.          Assessment & Plan:

## 2011-12-29 NOTE — Telephone Encounter (Signed)
Reviewed x-ray of hip.  Degenerative changes and bone spurs.  Reviewed results with pt.  Instructed him to call us if no improvement in symptoms with mobic.  He verbalizes understanding.

## 2011-12-29 NOTE — Assessment & Plan Note (Signed)
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 Recommended short term course of Mobic.  If no improvement, plan referral to orthopedics.

## 2011-12-29 NOTE — Patient Instructions (Signed)
Please call if symptoms worsen or if no improvement with meloxicam (mobic). Complete x-ray on the first floor today.

## 2012-01-16 ENCOUNTER — Encounter: Payer: Self-pay | Admitting: Family

## 2012-01-16 ENCOUNTER — Ambulatory Visit (INDEPENDENT_AMBULATORY_CARE_PROVIDER_SITE_OTHER): Payer: Medicare PPO | Admitting: Family Medicine

## 2012-01-16 ENCOUNTER — Encounter: Payer: Self-pay | Admitting: Family Medicine

## 2012-01-16 ENCOUNTER — Ambulatory Visit (INDEPENDENT_AMBULATORY_CARE_PROVIDER_SITE_OTHER): Payer: Medicare PPO | Admitting: Family

## 2012-01-16 VITALS — BP 118/73 | HR 71 | Temp 98.3°F | Ht 74.0 in | Wt 230.0 lb

## 2012-01-16 DIAGNOSIS — M169 Osteoarthritis of hip, unspecified: Secondary | ICD-10-CM

## 2012-01-16 DIAGNOSIS — M25559 Pain in unspecified hip: Secondary | ICD-10-CM

## 2012-01-16 MED ORDER — MELOXICAM 15 MG PO TABS: 15.0000 mg | ORAL_TABLET | Freq: Every day | ORAL | Status: DC

## 2012-01-16 NOTE — Patient Instructions (Addendum)
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 Take tylenol 500mg  1-2 tabs three times a day for pain. Meloxicam 15mg  daily with food for pain and inflammation. Glucosamine sulfate 750mg  twice a day is a supplement that has been shown to help moderate to severe arthritis. Capsaicin (over the counter rub) topically up to four times a day may also help with pain. Cortisone injections are an option - we will arrange for you to have a guided injection. It's important that you continue to stay active. Physical therapy is a consideration but unlikely to be helpful. Walker or cane if needed. Heat or ice 15 minutes at a time 3-4 times a day as needed to help with pain. Follow up with me in 1 month for a recheck on your status.

## 2012-01-16 NOTE — Assessment & Plan Note (Signed)
radiographs, history, and exam consistent with left hip DJD as cause of patient's symptoms.  This would less likely be iliopsoas tendinitis/snapping hip or lumbar radiculopathy.  Start with tylenol, meloxicam, glucosamine, consider capsaicin.  Discussed possibility of cortisone injection (guided by radiology) and he would like to try this due to severity of his pain.  Heat or ice as needed.  F/u with me in 1 month for reevaluation.

## 2012-01-16 NOTE — Patient Instructions (Signed)
Please see Dr. Karlton Lemon on the second floor this afternoon at [facility].

## 2012-01-16 NOTE — Progress Notes (Deleted)
  Subjective:    Patient ID: David [name], male    DOB: 04/09/1941, 71 y.o.   MRN: [id number]  HPI    Review of Systems     Objective:   Physical Exam        Assessment & Plan:

## 2012-01-16 NOTE — Progress Notes (Addendum)
Subjective:    Patient ID: David [name], male    DOB: Mar 23, 1941, 71 y.o.   MRN: [id number]  HPI  David [name].  [name] is a 71 yr old male who presents today for follow up of his left hip pain. Last visit he underwent an x-ray of the left hip and it noted some degenerative changes and spurring of the left  superior acetabulum.  He reports that he had some relief with the use of meloxicam, but then pain worsened.  He now reports some left leg weakness and pain radiating down the left anterior thigh. He is getting ready for his busy season at work and "wants to get things right."  Review of Systems    see HPI  Past Medical History  Diagnosis Date  . Hypertension     followed by [facility] (Dr Dani Gobble Croitoru)  . Hyperlipidemia   . Cancer     hx of prostate; s/p radioactive seed implant 10/2009 Dr Janice Norrie  . 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 History of cardiac cath 2004    negative for CAD  . History of myocardial perfusion scan 02/2010    negative for coronary insufficiency (LVEF 27%)  . Anemia, iron deficiency 11/15/2011    History   Social History  . Marital Status: Married    Spouse Name: N/A    Number of Children: 2  . Years of Education: N/A   Occupational History  . retired    Social History Main Topics  . Smoking status: Former Smoker    Quit date: 12/04/2005  . Smokeless tobacco: Never Used  . Alcohol Use: Yes  . Drug Use: Not on file  . Sexually Active: Not on file   Other Topics Concern  . Not on file   Social History Narrative   Stays active at homeRegular exercise: no    Past Surgical History  Procedure Date  . Radioactive seed implant 2010    prostate cancer    Family History  Problem Relation Age of Onset  . Hypertension Mother   . Thyroid disease Mother   . Cholelithiasis Daughter   . Cholelithiasis Son   . Hypertension Maternal Grandmother   . Diabetes Maternal  Grandmother     No Known Allergies  Current Outpatient Prescriptions on File Prior to Visit  Medication Sig Dispense Refill  . aspirin 325 MG EC tablet Take 325 mg by mouth daily.        . carvedilol (COREG) 25 MG tablet Take 25 mg by mouth 2 (two) times daily.        . finasteride (PROSCAR) 5 MG tablet Take 5 mg by mouth daily.        Marland Kitchen losartan-hydrochlorothiazide (HYZAAR) 100-12.5 MG per tablet Take 1 tablet by mouth daily.        . meloxicam (MOBIC) 7.5 MG tablet Take 1 tablet (7.5 mg total) by mouth daily.  15 tablet  0  . spironolactone (ALDACTONE) 25 MG tablet Take 25 mg by mouth daily.        Marland Kitchen terazosin (HYTRIN) 10 MG capsule Take 10 mg by mouth at bedtime.          BP 122/70  Pulse 71  Temp(Src) 98 F (36.7 C) (Oral)  Resp 16  Ht 6' 1.5" (1.867 m)  SpO2 99%    Objective:   Physical Exam Physical Exam  Constitutional: He appears well-developed and well-nourished. No  distress.  Cardiovascular: Normal rate and regular rhythm.  No murmur heard.  Pulmonary/Chest: Effort normal and breath sounds normal. No respiratory distress. He has no wheezes. He has no rales. He exhibits no tenderness.  Musculoskeletal: He exhibits no edema.  Full ROM of left hip. Some increased pain with passive movement of the left hip.  He is noted to have difficulty raising left leg off table due to pain.  Neuro- 2+ DP/PT reflexes bilaterally Psychiatric: He has a normal mood and affect. His behavior is normal. Judgment and thought content normal.            Assessment & Plan:

## 2012-01-16 NOTE — Progress Notes (Signed)
Subjective:    Patient ID: David [name], male    DOB: 11-Feb-1941, 71 y.o.   MRN: [id number]  PCP: [name]  HPI 71 yo M here for left hip/groin pain.  Patient denies known injury. States over past month has had slowly worsening left groin pain that has since spread to lateral left hip and down anterior thigh. No numbness or tingling. No bowel/bladder dysfunction. No back pain. Limping due to pain. Hard for him to get comfortable. Left leg feels weak as a result of pain. Took meloxicam which helped some No fevers or night sweats noted  Past Medical History  Diagnosis Date  . Hypertension     followed by [facility] and V[facility] (Dr Dani Gobble Croitoru)  . Hyperlipidemia   . Cancer     hx of prostate; s/p radioactive seed implant 10/2009 Dr Janice Norrie  . 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 History of cardiac cath 2004    negative for CAD  . History of myocardial perfusion scan 02/2010    negative for coronary insufficiency (LVEF 27%)  . Anemia, iron deficiency 11/15/2011    Current Outpatient Prescriptions on File Prior to Visit  Medication Sig Dispense Refill  . aspirin 325 MG EC tablet Take 325 mg by mouth daily.        . carvedilol (COREG) 25 MG tablet Take 25 mg by mouth 2 (two) times daily.        . finasteride (PROSCAR) 5 MG tablet Take 5 mg by mouth daily.        Marland Kitchen losartan-hydrochlorothiazide (HYZAAR) 100-12.5 MG per tablet Take 1 tablet by mouth daily.        Marland Kitchen spironolactone (ALDACTONE) 25 MG tablet Take 25 mg by mouth daily.        Marland Kitchen terazosin (HYTRIN) 10 MG capsule Take 10 mg by mouth at bedtime.          Past Surgical History  Procedure Date  . Radioactive seed implant 2010    prostate cancer    No Known Allergies  History   Social History  . Marital Status: Married    Spouse Name: N/A    Number of Children: 2  . Years of Education: N/A   Occupational History  . retired     Social History Main Topics  . Smoking status: Former Smoker    Quit date: 12/04/2005  . Smokeless tobacco: Never Used  . Alcohol Use: Yes  . Drug Use: Not on file  . Sexually Active: Not on file   Other Topics Concern  . Not on file   Social History Narrative   Stays active at homeRegular exercise: no    Family History  Problem Relation Age of Onset  . Hypertension Mother   . Thyroid disease Mother   . Cholelithiasis Daughter   . Cholelithiasis Son   . Hypertension Maternal Grandmother   . Diabetes Maternal Grandmother   . Heart attack Neg Hx   . Hyperlipidemia Neg Hx     BP 118/73  Pulse 71  Temp(Src) 98.3 F (36.8 C) (Oral)  Ht 6\' 2"  (1.88 m)  Wt 230 lb (104.327 kg)  BMI 29.53 kg/m2  Review of Systems See HPI above.    Objective:   Physical Exam Gen: NAD  Back: No gross deformity, scoliosis. No paraspinal TTP .  No midline or bony TTP.  TTP left groin anteriorly but feels deeper than iliopsoas tendon area.  FROM without pain on flexion, extension, lateral rotation. Strength LEs 4/5 with left hip flexion, 5/5 all other muscle groups.   2+ MSRs in patellar and achilles tendons, equal bilaterally. Negative SLRs. Sensation intact to light touch bilaterally. Positive left hip logroll.  Negative right logroll, decreased motion left worse than right. Decreased motion but negative fabers,negative piriformis stretches.    Assessment & Plan:  1. Left hip pain - radiographs, history, and exam consistent with left hip DJD as cause of patient's symptoms.  This would less likely be iliopsoas tendinitis/snapping hip or lumbar radiculopathy.  Start with tylenol, meloxicam, glucosamine, consider capsaicin.  Discussed possibility of cortisone injection (guided by radiology) and he would like to try this due to severity of his pain.  Heat or ice as needed.  F/u with me in 1 month for reevaluation.

## 2012-01-16 NOTE — Assessment & Plan Note (Addendum)
Deteriorated. Pt with left hip pain/ OA and possibly now some low back issues with radiculopathy.  Modest improvement with mobic.  Will refer to Dr. Barbaraann Barthel for further evaluation.

## 2012-01-19 ENCOUNTER — Other Ambulatory Visit: Payer: Self-pay | Admitting: Family Medicine

## 2012-01-19 DIAGNOSIS — M1990 Unspecified osteoarthritis, unspecified site: Secondary | ICD-10-CM

## 2012-01-26 ENCOUNTER — Ambulatory Visit
Admission: RE | Admit: 2012-01-26 | Discharge: 2012-01-26 | Disposition: A | Discharge status: Home / Self Care | Payer: Medicare PPO | Source: Ambulatory Visit | Attending: Family Medicine | Admitting: Family Medicine

## 2012-01-26 DIAGNOSIS — M1990 Unspecified osteoarthritis, unspecified site: Secondary | ICD-10-CM

## 2012-02-02 HISTORY — PX: TRANSTHORACIC ECHOCARDIOGRAM: SHX275

## 2012-02-08 ENCOUNTER — Other Ambulatory Visit (HOSPITAL_BASED_OUTPATIENT_CLINIC_OR_DEPARTMENT_OTHER): Payer: Medicare PPO | Admitting: Lab

## 2012-02-08 ENCOUNTER — Ambulatory Visit (HOSPITAL_BASED_OUTPATIENT_CLINIC_OR_DEPARTMENT_OTHER): Payer: Medicare PPO | Admitting: Hematology & Oncology

## 2012-02-08 DIAGNOSIS — D509 Iron deficiency anemia, unspecified: Secondary | ICD-10-CM

## 2012-02-08 DIAGNOSIS — N289 Disorder of kidney and ureter, unspecified: Secondary | ICD-10-CM

## 2012-02-08 DIAGNOSIS — N189 Chronic kidney disease, unspecified: Secondary | ICD-10-CM

## 2012-02-08 DIAGNOSIS — C61 Malignant neoplasm of prostate: Secondary | ICD-10-CM

## 2012-02-08 DIAGNOSIS — D649 Anemia, unspecified: Secondary | ICD-10-CM

## 2012-02-08 LAB — CBC WITH DIFFERENTIAL (CANCER CENTER ONLY)
BASO#: 0 10*3/uL (ref 0.0–0.2)
BASO%: 0.5 % (ref 0.0–2.0)
EOS%: 8.5 % — ABNORMAL HIGH (ref 0.0–7.0)
HGB: 11.4 g/dL — ABNORMAL LOW (ref 13.0–17.1)
MCH: 31.1 pg (ref 28.0–33.4)
MCHC: 32.8 g/dL (ref 32.0–35.9)
MONO%: 11 % (ref 0.0–13.0)
NEUT#: 2 10*3/uL (ref 1.5–6.5)
NEUT%: 51.2 % (ref 40.0–80.0)
RDW: 13.7 % (ref 11.1–15.7)

## 2012-02-08 LAB — IRON AND TIBC: UIBC: 185 ug/dL (ref 125–400)

## 2012-02-08 LAB — RETICULOCYTES (CHCC)
RBC.: 3.78 MIL/uL — ABNORMAL LOW (ref 4.22–5.81)
Retic Ct Pct: 1.3 % (ref 0.4–2.3)

## 2012-02-08 NOTE — Progress Notes (Signed)
This office note has been dictated.

## 2012-02-09 NOTE — Progress Notes (Signed)
CC:   [name], NP [name], MD [name], M.D.  DIAGNOSES: 1. Anemia, multifactorial. 2. History of stage I prostate cancer. 3. Chronic congestive cardiomyopathy. 4. Chronic renal insufficiency.  CURRENT THERAPY:  IV iron as indicated.  INTERIM HISTORY:  [name] comes in for his follow-up.  He is doing okay.  We have been following him along.  So far, we have not had to do anything different for him outside of IV iron back in September.  We have not had to give him any Aranesp as of yet.  When we last saw him in December, his ferritin was 483.  Iron saturation was 22%.  His testosterone back in September was 256.  He is working.  Thankfully, he has not had issues with his heart.  He does have renal insufficiency.  Back in November, his BUN and creatinine were 21 and 1.56.  He has had no fever.  He has had no rashes.  There has been no change in bowel or bladder habits.  PHYSICAL EXAMINATION:  General Appearance:  This is a well-developed, well-nourished black gentleman in no obvious distress.  Vital Signs: Show a temperature of 98, pulse 70, respiratory rate 18, blood pressure 109/63.  Weight is 231.  Head and Neck Exam:  Shows a normocephalic, atraumatic skull.  There are no ocular or oral lesions.  There are no palpable cervical or supraclavicular lymph nodes.  Lungs:  Clear to percussion and auscultation bilaterally.  Cardiac Exam:  Regular rate and rhythm with a normal S1 and S2.  There are no murmurs, rubs or bruits.  Abdominal Exam:  Soft with good bowel sounds.  There is no palpable abdominal mass.  There is no fluid wave.  There is no palpable hepatosplenomegaly.  Extremities:  Show no clubbing, cyanosis or edema. Skin Exam:  Does show some slightly dry skin.  LABORATORY STUDIES:  White cell count is 4, hemoglobin 11.4, hematocrit 34.8, platelet count 172.  IMPRESSION:  [name] is a 71 year old African American gentleman  with multifactorial anemia.  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  This certainly is a factor with his anemia.  The anemia is really not that bad.  I do not think he is symptomatic from it.  We will see what his iron studies show.  Hopefully, he will not need any iron infusions.  We will go ahead and plan to get him back to see [place] in another 3-4 months for follow-up.  I do not believe that he needs any lab work in between visits.    ______________________________ Volanda Napoleon, M.D. PRE/MEDQ  D:  02/08/2012  T:  02/09/2012  Job:  [id number]

## 2012-02-13 ENCOUNTER — Ambulatory Visit (INDEPENDENT_AMBULATORY_CARE_PROVIDER_SITE_OTHER): Payer: Medicare PPO | Admitting: Family Medicine

## 2012-02-13 ENCOUNTER — Encounter: Payer: Self-pay | Admitting: Family Medicine

## 2012-02-13 VITALS — BP 114/71 | HR 76 | Temp 98.1°F | Ht 74.0 in | Wt 228.0 lb

## 2012-02-13 DIAGNOSIS — M169 Osteoarthritis of hip, unspecified: Secondary | ICD-10-CM

## 2012-02-13 NOTE — Assessment & Plan Note (Signed)
improved with intraarticular injection.  This is consistent with his radiographs, history, and exam for left hip DJD.  Lumbar radiculopathy is a less likely possibility.  Discussed tylenol, meloxicam, glucosamine, consider capsaicin.  Can repeat injections every 3 months if necessary.  F/u prn.

## 2012-02-13 NOTE — Progress Notes (Signed)
Subjective:    Patient ID: David [name], male    DOB: January 08, 1941, 71 y.o.   MRN: [id number]  PCP: [name]  HPI  71 yo M here for f/u left hip/groin pain.  2/12: Patient denies known injury. States over past month has had slowly worsening left groin pain that has since spread to lateral left hip and down anterior thigh. No numbness or tingling. No bowel/bladder dysfunction. No back pain. Limping due to pain. Hard for him to get comfortable. Left leg feels weak as a result of pain. Took meloxicam which helped some No fevers or night sweats noted  3/12: Patient reports about 3 weeks ago he had intraarticular injection in left hip. Reports pain is much better than before, not having much pain. Does get a gnawing pain that is mild from left groin down anterior thigh occasionally with some tingling. Tingling is new. No bowel/bladder dysfunction. No back pain. Taking meloxicam daily.  Past Medical History  Diagnosis Date  . Hypertension     followed by [facility] and Vascular (Dr Dani Gobble Croitoru)  . Hyperlipidemia   . Cancer     hx of prostate; s/p radioactive seed implant 10/2009 Dr Janice Norrie  . 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 History of cardiac cath 2004    negative for CAD  . History of myocardial perfusion scan 02/2010    negative for coronary insufficiency (LVEF 27%)  . Anemia, iron deficiency 11/15/2011    Current Outpatient Prescriptions on File Prior to Visit  Medication Sig Dispense Refill  . aspirin 325 MG EC tablet Take 325 mg by mouth daily.        . carvedilol (COREG) 25 MG tablet Take 25 mg by mouth 2 (two) times daily.        . finasteride (PROSCAR) 5 MG tablet Take 5 mg by mouth daily.        Marland Kitchen losartan-hydrochlorothiazide (HYZAAR) 100-12.5 MG per tablet Take 1 tablet by mouth daily.        . meloxicam (MOBIC) 15 MG tablet Take 1 tablet (15 mg total) by mouth daily.  30  tablet  1  . spironolactone (ALDACTONE) 25 MG tablet Take 25 mg by mouth daily.        Marland Kitchen terazosin (HYTRIN) 10 MG capsule Take 10 mg by mouth at bedtime.          Past Surgical History  Procedure Date  . Radioactive seed implant 2010    prostate cancer    No Known Allergies  History   Social History  . Marital Status: Married    Spouse Name: N/A    Number of Children: 2  . Years of Education: N/A   Occupational History  . retired    Social History Main Topics  . Smoking status: Former Smoker    Quit date: 12/04/2005  . Smokeless tobacco: Never Used  . Alcohol Use: Yes  . Drug Use: Not on file  . Sexually Active: Not on file   Other Topics Concern  . Not on file   Social History Narrative   Stays active at homeRegular exercise: no    Family History  Problem Relation Age of Onset  . Hypertension Mother   . Thyroid disease Mother   . Cholelithiasis Daughter   . Cholelithiasis Son   . Hypertension Maternal Grandmother   . Diabetes Maternal Grandmother   . Heart attack Neg Hx   .  Hyperlipidemia Neg Hx     BP 114/71  Pulse 76  Temp(Src) 98.1 F (36.7 C) (Oral)  Ht 6\' 2"  (1.88 m)  Wt 228 lb (103.42 kg)  BMI 29.27 kg/m2  Review of Systems  See HPI above.    Objective:   Physical Exam  Gen: NAD  Back: No gross deformity, scoliosis. No paraspinal TTP.  No midline or bony TTP.  No TTP left groin, greater trochanter. FROM without pain on flexion, extension, lateral rotation. Negative SLRs. Sensation intact to light touch bilaterally. Minimally positive left hip logroll - this reproduces minimal discomfort in area of his pain in left groin.  Negative right logroll, decreased motion left worse than right.    Assessment & Plan:  1. Left hip pain - improved with intraarticular injection.  This is consistent with his radiographs, history, and exam for left hip DJD.  Lumbar radiculopathy is a less likely possibility.  Discussed tylenol, meloxicam,  glucosamine, consider capsaicin.  Can repeat injections every 3 months if necessary.  F/u prn.

## 2012-04-08 ENCOUNTER — Ambulatory Visit (INDEPENDENT_AMBULATORY_CARE_PROVIDER_SITE_OTHER): Payer: Medicare PPO | Admitting: Family Medicine

## 2012-04-08 ENCOUNTER — Telehealth: Payer: Self-pay | Admitting: Family

## 2012-04-08 ENCOUNTER — Encounter: Payer: Self-pay | Admitting: Family Medicine

## 2012-04-08 VITALS — BP 107/68 | HR 75 | Temp 97.9°F | Ht 74.0 in | Wt 229.0 lb

## 2012-04-08 DIAGNOSIS — M545 Low back pain: Secondary | ICD-10-CM

## 2012-04-08 DIAGNOSIS — M79605 Pain in left leg: Secondary | ICD-10-CM

## 2012-04-08 MED ORDER — TRAMADOL HCL 50 MG PO TABS: 50.0000 mg | ORAL_TABLET | Freq: Three times a day (TID) | ORAL | Status: AC | PRN

## 2012-04-08 MED ORDER — PREDNISONE (PAK) 10 MG PO TABS: ORAL_TABLET | ORAL | Status: DC

## 2012-04-08 MED ORDER — CYCLOBENZAPRINE HCL 10 MG PO TABS: 10.0000 mg | ORAL_TABLET | Freq: Three times a day (TID) | ORAL | Status: DC | PRN

## 2012-04-08 NOTE — Telephone Encounter (Signed)
Patient went to see Dr Barbaraann Barthel.  David [name] could only do a temporary handicap sticker application.  Patient brought form which I put in your box for a permanent sticker/ Please advise the patient if you will fill this out.

## 2012-04-08 NOTE — Patient Instructions (Addendum)
You have lumbar radiculopathy (a pinched nerve in your low back). Take tylenol for baseline pain relief (1-2 extra strength tabs 3x/day) Start prednisone and take for 6 days until this is gone. AFTER finishing prednisone, go back to taking advil 3 tablets three times a day with food as needed. Tramadol as needed for severe pain (no driving on this medicine). Flexeril as needed for muscle spasms (no driving on this medicine if it makes you sleepy). Stay as active as possible. Physical therapy has been shown to be helpful as well - start this as soon as you can. Strengthening of low back muscles, abdominal musculature are key for long term pain relief. If not improving, will consider further imaging (x-rays and MRI). Follow up with me in 2 weeks for reevaluation.

## 2012-04-09 ENCOUNTER — Encounter: Payer: Self-pay | Admitting: Family Medicine

## 2012-04-09 DIAGNOSIS — M545 Low back pain: Secondary | ICD-10-CM | POA: Insufficient documentation

## 2012-04-09 NOTE — Telephone Encounter (Signed)
Signed.

## 2012-04-09 NOTE — Progress Notes (Signed)
Subjective:    Patient ID: David [name], male    DOB: [birth year], 71 y.o.   MRN: [id number]  PCP: [name]  Back Pain   71 yo M here for f/u left hip/groin pain, now with low back pain.  2/12: Patient denies known injury. States over past month has had slowly worsening left groin pain that has since spread to lateral left hip and down anterior thigh. No numbness or tingling. No bowel/bladder dysfunction. No back pain. Limping due to pain. Hard for him to get comfortable. Left leg feels weak as a result of pain. Took meloxicam which helped some No fevers or night sweats noted  3/12: Patient reports about 3 weeks ago he had intraarticular injection in left hip. Reports pain is much better than before, not having much pain. Does get a gnawing pain that is mild from left groin down anterior thigh occasionally with some tingling. Tingling is new. No bowel/bladder dysfunction. No back pain. Taking meloxicam daily.  5/6: Patient had reported injection in left hip helped prior pain quite a bit. Now reporting pain in left buttock/low back with radiation into lateral hip and down left leg. Left leg and lower back feel 'tired' Feels a shock-like sensation and tingling into left thigh. Taking advil as needed. No bowel/bladder dysfunction.  Past Medical History  Diagnosis Date  . Hypertension     followed by [facility] and Vascular (Dr Dani Gobble Croitoru)  . Hyperlipidemia   . Cancer     hx of prostate; s/p radioactive seed implant 10/2009 Dr Janice Norrie  . 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 History of cardiac cath 2004    negative for CAD  . History of myocardial perfusion scan 02/2010    negative for coronary insufficiency (LVEF 27%)  . Anemia, iron deficiency 11/15/2011    Current Outpatient Prescriptions on File Prior to Visit  Medication Sig Dispense Refill  . aspirin 325 MG EC tablet Take 325  mg by mouth daily.        . carvedilol (COREG) 25 MG tablet Take 25 mg by mouth 2 (two) times daily.        . finasteride (PROSCAR) 5 MG tablet Take 5 mg by mouth daily.        Marland Kitchen losartan-hydrochlorothiazide (HYZAAR) 100-12.5 MG per tablet Take 1 tablet by mouth daily.        . meloxicam (MOBIC) 15 MG tablet Take 1 tablet (15 mg total) by mouth daily.  30 tablet  1  . spironolactone (ALDACTONE) 25 MG tablet Take 25 mg by mouth daily.        Marland Kitchen terazosin (HYTRIN) 10 MG capsule Take 10 mg by mouth at bedtime.          Past Surgical History  Procedure Date  . Radioactive seed implant 2010    prostate cancer    No Known Allergies  History   Social History  . Marital Status: Married    Spouse Name: N/A    Number of Children: 2  . Years of Education: N/A   Occupational History  . retired    Social History Main Topics  . Smoking status: Former Smoker    Quit date: 12/04/2005  . Smokeless tobacco: Never Used  . Alcohol Use: Yes  . Drug Use: Not on file  . Sexually Active: Not on file   Other Topics Concern  . Not on file   Social History Narrative  Stays active at homeRegular exercise: no    Family History  Problem Relation Age of Onset  . Hypertension Mother   . Thyroid disease Mother   . Cholelithiasis Daughter   . Cholelithiasis Son   . Hypertension Maternal Grandmother   . Diabetes Maternal Grandmother   . Heart attack Neg Hx   . Hyperlipidemia Neg Hx     BP 107/68  Pulse 75  Temp(Src) 97.9 F (36.6 C) (Oral)  Ht 6\' 2"  (1.88 m)  Wt 229 lb (103.874 kg)  BMI 29.40 kg/m2  Review of Systems  Musculoskeletal: Positive for back pain.   See HPI above.    Objective:   Physical Exam  Gen: NAD  Back: No gross deformity, scoliosis. Left lumbar and buttock TTP.  No midline or bony TTP.  No TTP left groin, greater trochanter, right paraspinal region. FROM with mild pain on extension and flexion. Strength 4/5 with left hip flexion, otherwise 5/5 all  bilateral lower extremity muscle groups. MSRs 2+ bilateral patellar and achilles tendons. Negative SLRs. Sensation intact to light touch bilaterally. Negative left hip logroll though motion mod limited.     Assessment & Plan:  1. Low back pain - current presentation fits more with lumbar radiculopathy while prior issues pointed to left hip DJD which has improved.  He would like to start with medication (discussed PT but he wants to wait a couple weeks).  Start prednisone dose pack then switch to advil.  Tramadol, flexeril as needed.  F/u in 2 weeks.  If not improving will consider further imaging.

## 2012-04-09 NOTE — Telephone Encounter (Signed)
I agree with the temporary handicapped sticker at this point.  I am hopeful that his pain will improve with the treatments Dr. Barbaraann Barthel is providing.

## 2012-04-09 NOTE — Assessment & Plan Note (Signed)
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  He would like to start with medication (discussed PT but he wants to wait a couple weeks).  Start prednisone dose pack then switch to advil.  Tramadol, flexeril as needed.  F/u in 2 weeks.  If not improving will consider further imaging.

## 2012-04-09 NOTE — Telephone Encounter (Signed)
Notified pt, he would like [place] to complete the application for the temporary sticker.  Pt reports that he has not received the placard from Dr Barbaraann Barthel. Please advise.

## 2012-04-09 NOTE — Telephone Encounter (Signed)
Notified pt that temporary application has been signed and is at the front desk for pick up.

## 2012-04-16 MED ORDER — BACLOFEN 10 MG PO TABS: 10.0000 mg | ORAL_TABLET | Freq: Three times a day (TID) | ORAL | Status: AC

## 2012-04-16 NOTE — Progress Notes (Signed)
Addended by: Dene Gentry on: 04/16/2012 01:15 PM   Modules accepted: Orders

## 2012-06-11 ENCOUNTER — Other Ambulatory Visit (HOSPITAL_BASED_OUTPATIENT_CLINIC_OR_DEPARTMENT_OTHER): Payer: Medicare PPO | Admitting: Lab

## 2012-06-11 ENCOUNTER — Ambulatory Visit (HOSPITAL_BASED_OUTPATIENT_CLINIC_OR_DEPARTMENT_OTHER): Payer: Medicare PPO | Admitting: Hematology & Oncology

## 2012-06-11 ENCOUNTER — Encounter: Payer: Self-pay | Admitting: Hematology & Oncology

## 2012-06-11 VITALS — BP 113/65 | HR 71 | Temp 97.2°F | Ht 74.0 in | Wt 217.0 lb

## 2012-06-11 DIAGNOSIS — D509 Iron deficiency anemia, unspecified: Secondary | ICD-10-CM

## 2012-06-11 DIAGNOSIS — D649 Anemia, unspecified: Secondary | ICD-10-CM

## 2012-06-11 DIAGNOSIS — D631 Anemia in chronic kidney disease: Secondary | ICD-10-CM

## 2012-06-11 DIAGNOSIS — N289 Disorder of kidney and ureter, unspecified: Secondary | ICD-10-CM

## 2012-06-11 DIAGNOSIS — Z8546 Personal history of malignant neoplasm of prostate: Secondary | ICD-10-CM

## 2012-06-11 LAB — FERRITIN: Ferritin: 528 ng/mL — ABNORMAL HIGH (ref 22–322)

## 2012-06-11 LAB — IRON AND TIBC
%SAT: 20 % (ref 20–55)
Iron: 51 ug/dL (ref 42–165)
TIBC: 252 ug/dL (ref 215–435)
UIBC: 201 ug/dL (ref 125–400)

## 2012-06-11 LAB — CBC WITH DIFFERENTIAL (CANCER CENTER ONLY)
Eosinophils Absolute: 0.4 10*3/uL (ref 0.0–0.5)
MONO#: 0.6 10*3/uL (ref 0.1–0.9)
NEUT#: 2.9 10*3/uL (ref 1.5–6.5)
Platelets: 231 10*3/uL (ref 145–400)
RBC: 3.34 10*6/uL — ABNORMAL LOW (ref 4.20–5.70)
WBC: 5 10*3/uL (ref 4.0–10.0)

## 2012-06-11 NOTE — Progress Notes (Signed)
CC:   David Alar, NP David Klein, MD David [name], M.D.  DIAGNOSES: 1. Multifactorial anemia. 2. Chronic renal insufficiency. 3. Chronic congestive cardiomyopathy. 4. History of stage I prostate cancer.  CURRENT THERAPY:  Observation.  INTERIM HISTORY:  David [name] comes in for followup.  We last saw him [name] in March.  Since then, he has been doing okay.  He has had no real specific complaints.  His anemia clearly is multifactorial.  He has had iron deficiency in the past.  He got IV iron [name] in September 2012.  His erythropoietin level has been on the low side.  [name] in September, it was only 35.  When we saw David [name] in March, his ferritin was 332 with an iron saturation of 22%.  He does have chronic renal insufficiency, again reflected by the low erythropoietin level.  He has had no obvious bleeding.  There has been no change in bowel or bladder habits.  He has had no change in his medications.  He is on about 11 different medications.  PHYSICAL EXAMINATION:  This is a well-developed, well-nourished black gentleman in no obvious distress.  Vital signs:  A temperature of 97.3, pulse 71, respiratory rate 20, blood pressure 113/65.  Weight is 217. Head and neck:  Normocephalic, atraumatic skull.  There are no ocular or oral lesions.  There are no palpable cervical or supraclavicular lymph nodes.  Lungs:  Clear bilaterally.  Cardiac:  Regular rate and rhythm with a normal S1 and S2.  There are no murmurs, rubs or bruits. Abdomen:  Soft with good bowel sounds.  There is no palpable abdominal mass.  There is no palpable pedal hepatosplenomegaly.  [name]:  No tenderness over the spine, ribs, or hips.  Extremities:  Some trace edema in his legs bilaterally.  LABORATORIES STUDIES:  White cell count is 5, hemoglobin 10.3, hematocrit 31.6, platelet count is 231.  MCV is 95.  IMPRESSION:  David [name] is a 71 year old gentleman with multifactorial anemia.  I  suspect that his iron probably will be on the lower side.  He does have renal insufficiency.  We may have to move ahead and consider Aranesp for him.  We will see what his iron stores show.  We will plan to get him [name] in 2 months' time now.  We will have to be somewhat aggressive with trying to get his hemoglobin better.    ______________________________ Volanda Napoleon, M.D. PRE/MEDQ  D:  06/11/2012  T:  06/11/2012  Job:  [id number]

## 2012-06-11 NOTE — Progress Notes (Signed)
This office note has been dictated.

## 2012-06-19 ENCOUNTER — Telehealth: Payer: Self-pay | Admitting: *Deleted

## 2012-06-19 NOTE — Telephone Encounter (Signed)
Called patient to let him know that his iron studies were ok per dr. Marin Olp.

## 2012-06-19 NOTE — Telephone Encounter (Signed)
Message copied by Rico Ala on Wed Jun 19, 2012  2:36 PM ------      Message from: Burney Gauze R      Created: Wed Jun 19, 2012  1:14 PM       please call and tell him that his iron studies are okay. Thanks. Laurey Arrow

## 2012-08-12 ENCOUNTER — Other Ambulatory Visit: Payer: Medicare PPO | Admitting: Lab

## 2012-08-12 ENCOUNTER — Ambulatory Visit: Payer: Medicare PPO | Admitting: Hematology & Oncology

## 2012-08-13 ENCOUNTER — Ambulatory Visit (HOSPITAL_BASED_OUTPATIENT_CLINIC_OR_DEPARTMENT_OTHER): Payer: Medicare PPO | Admitting: Hematology & Oncology

## 2012-08-13 ENCOUNTER — Other Ambulatory Visit: Payer: Medicare PPO | Admitting: Lab

## 2012-08-13 ENCOUNTER — Ambulatory Visit: Payer: Medicare PPO | Admitting: Hematology & Oncology

## 2012-08-13 ENCOUNTER — Other Ambulatory Visit (HOSPITAL_BASED_OUTPATIENT_CLINIC_OR_DEPARTMENT_OTHER): Payer: Medicare PPO | Admitting: Lab

## 2012-08-13 VITALS — BP 120/68 | HR 63 | Temp 98.2°F | Resp 20 | Ht 74.0 in | Wt 220.0 lb

## 2012-08-13 DIAGNOSIS — N189 Chronic kidney disease, unspecified: Secondary | ICD-10-CM

## 2012-08-13 DIAGNOSIS — D649 Anemia, unspecified: Secondary | ICD-10-CM

## 2012-08-13 LAB — CBC WITH DIFFERENTIAL (CANCER CENTER ONLY)
BASO%: 0.3 % (ref 0.0–2.0)
LYMPH%: 28.5 % (ref 14.0–48.0)
MCH: 31.3 pg (ref 28.0–33.4)
MCHC: 32.9 g/dL (ref 32.0–35.9)
MCV: 95 fL (ref 82–98)
MONO%: 12.8 % (ref 0.0–13.0)
NEUT%: 45.6 % (ref 40.0–80.0)
Platelets: 184 10*3/uL (ref 145–400)
RDW: 12.8 % (ref 11.1–15.7)

## 2012-08-13 LAB — RETICULOCYTES (CHCC)
RBC.: 3.24 MIL/uL — ABNORMAL LOW (ref 4.22–5.81)
Retic Ct Pct: 1.3 % (ref 0.4–2.3)

## 2012-08-13 LAB — IRON AND TIBC
%SAT: 19 % — ABNORMAL LOW (ref 20–55)
Iron: 46 ug/dL (ref 42–165)
UIBC: 194 ug/dL (ref 125–400)

## 2012-08-13 NOTE — Progress Notes (Signed)
This office note has been dictated.

## 2012-08-13 NOTE — Progress Notes (Signed)
CC:   [name], NP [name], MD [name], M.D.  DIAGNOSES: 1. Anemia of renal insufficiency. 2. Chronic renal failure. 3. Congestive heart failure. 4. Intermittent iron-deficiency anemia.  CURRENT THERAPY:  Observation.  INTERIM HISTORY:  David [name] comes in for followup.  He is doing okay. He does feel a little tired.  He does have some issues with I think sciatica down his left leg.  He probably needs to get this taken care of as he does seem to be limping quite a bit.  When we last saw him in July, his ferritin was 528 with iron saturation of 20%.  He has not had iron since I think September of last year.  We have yet to give him Aranesp.  There His erythropoietin level is on the low side.  Last time we checked, it was 27.  He has had no bleeding.  He has had no change in bowel or bladder habits.  He has had no cough.  His appetite has been okay.  He does note occasional leg swelling.  PHYSICAL EXAMINATION:  This is a well-developed, well-nourished black gentleman in no obvious distress.  Vital signs:  98.2, pulse 63, respiratory rate 20, blood pressure 120/68.  Weight is 220.  Head and neck:  Normocephalic, atraumatic skull.  There are no ocular or oral lesions.  There are no palpable cervical or supraclavicular lymph nodes. Lungs:  Clear bilaterally.  Cardiac:  Regular rate and rhythm with a normal S1 and S2.  There are no murmurs, rubs or bruits.  Abdomen:  Soft with good bowel sounds.  There is no fluid wave.  No palpable hepatosplenomegaly is noted.  Back:  No tenderness over the spine, ribs, or hips.  Extremities:  Some slight weakness over the left leg.  He has some trace edema in his legs bilaterally.  LABORATORY STUDIES:  White cell count is 3.8, hemoglobin 9.8, hematocrit 29.8, platelet count 184.  MCV is 95.  IMPRESSION:  [name] is a 71 year old gentleman with multifactorial anemia.  I really believe that he is going to need Aranesp.   His hemoglobin keeps dropping.  We will see what his iron studies show.  I did give him some oral iron to take.  It is hard to know if this is going to help or not.  He is on numerous medications.  I told Mr. Arntzen that would we would get him back in 6 weeks.  If, at that point in time, his hemoglobin is no better, then we are going to going to have to see if he will take Aranesp.    ______________________________ Volanda Napoleon, M.D. PRE/MEDQ  D:  08/13/2012  T:  08/13/2012  Job:  [id number]

## 2012-09-11 ENCOUNTER — Ambulatory Visit: Payer: Medicare PPO

## 2012-09-11 ENCOUNTER — Ambulatory Visit (HOSPITAL_BASED_OUTPATIENT_CLINIC_OR_DEPARTMENT_OTHER): Payer: Medicare PPO | Admitting: Hematology & Oncology

## 2012-09-11 ENCOUNTER — Ambulatory Visit (HOSPITAL_BASED_OUTPATIENT_CLINIC_OR_DEPARTMENT_OTHER): Payer: Medicare PPO | Admitting: Lab

## 2012-09-11 DIAGNOSIS — N189 Chronic kidney disease, unspecified: Secondary | ICD-10-CM

## 2012-09-11 DIAGNOSIS — D509 Iron deficiency anemia, unspecified: Secondary | ICD-10-CM

## 2012-09-11 DIAGNOSIS — D649 Anemia, unspecified: Secondary | ICD-10-CM

## 2012-09-11 DIAGNOSIS — D631 Anemia in chronic kidney disease: Secondary | ICD-10-CM

## 2012-09-11 DIAGNOSIS — I509 Heart failure, unspecified: Secondary | ICD-10-CM

## 2012-09-11 LAB — CBC WITH DIFFERENTIAL (CANCER CENTER ONLY)
BASO#: 0 10*3/uL (ref 0.0–0.2)
Eosinophils Absolute: 0.5 10*3/uL (ref 0.0–0.5)
HCT: 33.8 % — ABNORMAL LOW (ref 38.7–49.9)
HGB: 11.4 g/dL — ABNORMAL LOW (ref 13.0–17.1)
LYMPH#: 1.1 10*3/uL (ref 0.9–3.3)
MCHC: 33.7 g/dL (ref 32.0–35.9)
MONO#: 0.5 10*3/uL (ref 0.1–0.9)
NEUT%: 41.9 % (ref 40.0–80.0)
RBC: 3.6 10*6/uL — ABNORMAL LOW (ref 4.20–5.70)
WBC: 3.6 10*3/uL — ABNORMAL LOW (ref 4.0–10.0)

## 2012-09-11 LAB — FERRITIN: Ferritin: 493 ng/mL — ABNORMAL HIGH (ref 22–322)

## 2012-09-11 LAB — RETICULOCYTES (CHCC)
ABS Retic: 51.7 10*3/uL (ref 19.0–186.0)
RBC.: 3.69 MIL/uL — ABNORMAL LOW (ref 4.22–5.81)
Retic Ct Pct: 1.4 % (ref 0.4–2.3)

## 2012-09-11 LAB — IRON AND TIBC: Iron: 58 ug/dL (ref 42–165)

## 2012-09-11 MED ORDER — FE FUM-VIT C-VIT B12-FA 460-60-0.01-1 MG PO CAPS: ORAL_CAPSULE | ORAL | Status: DC

## 2012-09-11 NOTE — Progress Notes (Signed)
This office note has been dictated.

## 2012-09-11 NOTE — Addendum Note (Signed)
Addended by: Burney Gauze R on: 09/11/2012 05:13 PM   Modules accepted: Orders

## 2012-09-11 NOTE — Progress Notes (Signed)
Pt did not need any injection today per M.D

## 2012-09-11 NOTE — Patient Instructions (Signed)
Call if problems 

## 2012-09-12 NOTE — Progress Notes (Signed)
CC:   [name], NP [name], MD [name], M.D.  DIAGNOSES: 1. Anemia of renal insufficiency. 2. Chronic renal failure. 3. Congestive heart failure. 4. Iron-deficiency anemia.  CURRENT THERAPY:  Oral iron.  INTERIM HISTORY:  Mr. Hubbard comes in for his followup.  He is still bothered by his left leg.  He has sciatica.  He is not going to get this taken care of because he not want anybody, "cutting on me."  I did give him some oral iron samples when we last saw him.  He has done well with these.  He has had no obvious bleeding.  He has had no fever.  His appetite has been okay.  He has had no cough.  There has been no leg swelling.  He has had no rashes.  PHYSICAL EXAMINATION:  This is a well-developed, well-nourished white gentleman in no obvious distress.  Vital signs:  Temperature of 98, pulse 63, respiratory rate 20, blood pressure 133/68.  Weight is 217. Head and neck:  Normocephalic, atraumatic skull.  There are no ocular or oral lesions.  There are no palpable cervical or supraclavicular lymph nodes.  Lungs:  Clear bilaterally.  Cardiac:  Regular rate and rhythm with a normal S1 and S2.  There are no murmurs, rubs or bruits. Abdomen:  Soft with good bowel sounds.  He is mildly obese.  He has no fluid wave.  There is no palpable hepatosplenomegaly.  Back:  No tenderness over the spine, ribs, or hips.  Extremities:  No clubbing, cyanosis or edema.  LABORATORY STUDIES:  White cell count 3.6, hemoglobin 11.4, hematocrit 33.8, platelet count 190.  MCV is 94.  IMPRESSION:  Mr. Blakeman is a 71 year old African American gentleman with anemia that is multifactorial.  The fact that he has responded to oral iron is certainly encouraging.  He does not need any Aranesp right now.  His endogenous erythropoietin level was low (27), so we always could use Aranesp if necessary.  We will continue him on the oral iron.  We will plan to get him back in about 3  months' time now.  Hopefully, he will take care of this sciatica that he has.    ______________________________ Volanda Napoleon, M.D. PRE/MEDQ  D:  09/11/2012  T:  09/12/2012  Job:  [id number]

## 2012-10-10 ENCOUNTER — Telehealth: Payer: Self-pay | Admitting: *Deleted

## 2012-10-10 NOTE — Telephone Encounter (Signed)
Received application for disability parking placard from pt. Form forwarded to Provider for completion and signature.

## 2012-10-11 NOTE — Telephone Encounter (Signed)
Signed.

## 2012-10-11 NOTE — Telephone Encounter (Signed)
Pt notified. Copy placed at front desk for pick up.

## 2012-12-12 ENCOUNTER — Ambulatory Visit (HOSPITAL_BASED_OUTPATIENT_CLINIC_OR_DEPARTMENT_OTHER): Payer: Medicare PPO | Admitting: Hematology & Oncology

## 2012-12-12 ENCOUNTER — Other Ambulatory Visit (HOSPITAL_BASED_OUTPATIENT_CLINIC_OR_DEPARTMENT_OTHER): Payer: Medicare PPO | Admitting: Lab

## 2012-12-12 VITALS — BP 131/69 | HR 58 | Temp 97.7°F | Resp 58 | Ht 74.0 in | Wt 226.0 lb

## 2012-12-12 DIAGNOSIS — D509 Iron deficiency anemia, unspecified: Secondary | ICD-10-CM

## 2012-12-12 DIAGNOSIS — D649 Anemia, unspecified: Secondary | ICD-10-CM

## 2012-12-12 DIAGNOSIS — I509 Heart failure, unspecified: Secondary | ICD-10-CM

## 2012-12-12 DIAGNOSIS — D72819 Decreased white blood cell count, unspecified: Secondary | ICD-10-CM

## 2012-12-12 DIAGNOSIS — N189 Chronic kidney disease, unspecified: Secondary | ICD-10-CM

## 2012-12-12 LAB — RETICULOCYTES (CHCC)
ABS Retic: 40.2 10*3/uL (ref 19.0–186.0)
Retic Ct Pct: 1.1 % (ref 0.4–2.3)

## 2012-12-12 LAB — CBC WITH DIFFERENTIAL (CANCER CENTER ONLY)
BASO#: 0 10*3/uL (ref 0.0–0.2)
EOS%: 10 % — ABNORMAL HIGH (ref 0.0–7.0)
Eosinophils Absolute: 0.4 10*3/uL (ref 0.0–0.5)
LYMPH%: 39 % (ref 14.0–48.0)
MCH: 31.1 pg (ref 28.0–33.4)
MCHC: 32.6 g/dL (ref 32.0–35.9)
MCV: 95 fL (ref 82–98)
MONO%: 17.5 % — ABNORMAL HIGH (ref 0.0–13.0)
NEUT#: 1.2 10*3/uL — ABNORMAL LOW (ref 1.5–6.5)
Platelets: 166 10*3/uL (ref 145–400)

## 2012-12-12 LAB — IRON AND TIBC
Iron: 57 ug/dL (ref 42–165)
UIBC: 206 ug/dL (ref 125–400)

## 2012-12-12 LAB — FERRITIN: Ferritin: 406 ng/mL — ABNORMAL HIGH (ref 22–322)

## 2012-12-12 MED ORDER — FE FUM-VIT C-VIT B12-FA 460-60-0.01-1 MG PO CAPS: ORAL_CAPSULE | ORAL | Status: DC

## 2012-12-12 NOTE — Progress Notes (Signed)
This office note has been dictated.

## 2012-12-12 NOTE — Addendum Note (Signed)
Addended by: Jodelle Green on: 12/12/2012 05:51 PM   Modules accepted: Orders

## 2012-12-13 NOTE — Progress Notes (Signed)
CC:   David Alar, NP David Klein, MD David [name], M.D.  DIAGNOSIS:  Multifactorial anemia, renal insufficiency/iron deficiency/congestive heart failure.  CURRENT THERAPY:  Oral iron (the patient given samples as we have them).  INTERIM HISTORY:  David [name] comes in for followup.  We last saw him back in October.  He has been doing okay since then.  He still has issues with sciatica in his left leg.  Unfortunately, he is not going to have anything done about this.  When we last saw him, his ferritin was 493 with iron saturation of 25%.  He has had no bleeding.  He has had no exacerbation of his heart failure.  He has not noted any leg swelling.  There has been no increased cough or shortness of breath.  He does feel tired.  He continues on his myriad number of medications.  PHYSICAL EXAMINATION:  General:  This is a fairly well-developed, well- nourished African American gentleman in no obvious distress.  Vital signs:  Show a temperature of 97.7, pulse 58, respiratory rate 18, blood pressure 131/69.  Weight is 226.  Head and neck:  Shows a normocephalic, atraumatic skull.  There are no ocular or oral lesions.  There are no palpable cervical or supraclavicular lymph nodes.  Lungs:  Clear bilaterally.  Cardiac:  Regular rate and rhythm with a normal S1 and S2. He has a 1/6 systolic ejection murmur.  Abdomen:  Soft with good bowel sounds.  There is no palpable abdominal mass.  There is no palpable hepatosplenomegaly.  Extremities:  Show no clubbing, cyanosis or edema. Neurological:  Shows no focal neurological deficits.  LABORATORY STUDIES:  White cell count 3.5, hemoglobin 10.9, hematocrit 33.4, platelet count is 166.  IMPRESSION:  David [name] is a 72 year old African American gentleman with multifactorial anemia.  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  For now, we will see how he does with the oral iron.  I  gave him some more samples.  He says he can get medication through a mail-order service.  We will see how this can work for him.  We will plan to get him back in 2 more months.    ______________________________ David [name], M.D. PRE/MEDQ  D:  12/12/2012  T:  12/13/2012  Job:  [id number]

## 2013-01-08 ENCOUNTER — Ambulatory Visit (INDEPENDENT_AMBULATORY_CARE_PROVIDER_SITE_OTHER): Payer: Medicare PPO | Admitting: Family

## 2013-01-08 ENCOUNTER — Encounter: Payer: Self-pay | Admitting: Family

## 2013-01-08 VITALS — BP 136/76 | HR 65 | Temp 98.5°F | Resp 16 | Ht 73.5 in | Wt 233.0 lb

## 2013-01-08 DIAGNOSIS — M169 Osteoarthritis of hip, unspecified: Secondary | ICD-10-CM

## 2013-01-08 DIAGNOSIS — R7309 Other abnormal glucose: Secondary | ICD-10-CM

## 2013-01-08 DIAGNOSIS — Z13820 Encounter for screening for osteoporosis: Secondary | ICD-10-CM

## 2013-01-08 DIAGNOSIS — D509 Iron deficiency anemia, unspecified: Secondary | ICD-10-CM

## 2013-01-08 DIAGNOSIS — C61 Malignant neoplasm of prostate: Secondary | ICD-10-CM

## 2013-01-08 DIAGNOSIS — R739 Hyperglycemia, unspecified: Secondary | ICD-10-CM

## 2013-01-08 DIAGNOSIS — Z8546 Personal history of malignant neoplasm of prostate: Secondary | ICD-10-CM

## 2013-01-08 DIAGNOSIS — Z0000 Encounter for general adult medical examination without abnormal findings: Secondary | ICD-10-CM

## 2013-01-08 DIAGNOSIS — E785 Hyperlipidemia, unspecified: Secondary | ICD-10-CM

## 2013-01-08 DIAGNOSIS — I10 Essential (primary) hypertension: Secondary | ICD-10-CM

## 2013-01-08 NOTE — Assessment & Plan Note (Signed)
He will return fasting for FLP.

## 2013-01-08 NOTE — Progress Notes (Signed)
Subjective:    Patient ID: David [name], male    DOB: Sep 22, 1941, 72 y.o.   MRN: [id number]  HPI  Subjective:   Patient here for Medicare annual wellness visit and management of other chronic and acute problems.  HTN- pt continues coreg, hyzaar and aldactone.   Hyperlipidemia- not currently on meds.  Has been in the past.   Iron deficiency anemia- he is on oral iron supplement, this is managed by Dr. Marin Olp.    Prostate cancer- He continues to follow with Dr. Janice Norrie.  Hyperglycemia- has not had a A1C in some time.  Last A1C was 6.2 in 2012.  Preventative- here for non-fasting physical. Pt up to date with flu, pneumovax, shingles and tetanus vaccine.  Up to date with colonoscopy. Not sure of last EKG.  Risk factors: at risk for CAD  Roster of Physicians Providing Medical Care to Patient: Dr. Johney Maine- opthalmology Dr. Barbaraann Barthel sports medicine Dr. Janice Norrie urology Dr. Marin Olp- hematology Dr. Shann Medal- cardiology  Activities of Daily Living  In your present state of health, do you have any difficulty performing the following activities? Preparing food and eating?: No  Bathing yourself: No  Getting dressed: No  Using the toilet:No  Moving around from place to place: No  In the past year have you fallen or had a near fall?:No    Home Safety: Has smoke detector and wears seat belts. No firearms. No excess sun exposure.  Diet and Exercise  Current exercise habits: Reports walking. Limited by hip pain. Dietary issues discussed: healthy diet   Depression Screen  (Note: if answer to either of the following is "Yes", then a more complete depression screening is indicated)  Q1: Over the past two weeks, have you felt down, depressed or hopeless?no  Q2: Over the past two weeks, have you felt little interest or pleasure in doing things? no   The following portions of the patient's history were reviewed and updated as appropriate: allergies, current medications, past family history, past  medical history, past social history, past surgical history and problem list.    Objective:   Vision: Hearing: Able to hear forced whisper at 6 feet. Body mass index: Cognitive Impairment Assessment: cognition, memory and judgment appear normal.   Assessment:   Medicare wellness utd on preventive parameters - order dexa  Plan:   During the course of the visit the patient was educated and counseled about appropriate screening and preventive services including:       Fall prevention    Bone densitometry screening  Diabetes screening  Nutrition counseling   Vaccines / LABS  Up to date.  Patient Instructions (the written plan) was given to the patient.     Review of Systems  Constitutional: Negative for unexpected weight change.  HENT: Negative for hearing loss.   Eyes: Negative for visual disturbance.  Respiratory: Negative for cough.   Gastrointestinal: Negative for nausea, vomiting, diarrhea and constipation.  Genitourinary: Negative for dysuria and frequency.  Musculoskeletal: Negative for arthralgias.  Skin: Negative for rash.  Neurological: Negative for headaches.  Hematological: Does not bruise/bleed easily.  Psychiatric/Behavioral:       Denies depression       Objective:   Physical Exam  Physical Exam  Constitutional: He is oriented to person, place, and time. He appears well-developed and well-nourished. No distress.  HENT:  Head: Normocephalic and atraumatic.  Right Ear: Tympanic membrane and ear canal normal.  Left Ear: Tympanic membrane and ear canal normal.  Mouth/Throat: Oropharynx is clear  and moist.  Eyes: Pupils are equal, round, and reactive to light. No scleral icterus.  Neck: Normal range of motion. No thyromegaly present.  Cardiovascular: Normal rate and regular rhythm.   No murmur heard. Pulmonary/Chest: Effort normal and breath sounds normal. No respiratory distress. He has no wheezes. He has no rales. He exhibits no tenderness.   Abdominal: Soft. Bowel sounds are normal. He exhibits no distension and no mass. There is no tenderness. There is no rebound and no guarding.  Musculoskeletal: He exhibits no edema.  Lymphadenopathy:    He has no cervical adenopathy.  Neurological: He is alert and oriented to person, place, and time. He has normal reflexes. He exhibits normal muscle tone. Coordination normal.  Skin: Skin is warm and dry.  Psychiatric: He has a normal mood and affect. His behavior is normal. Judgment and thought content normal.  GU- deferred to urology.         Assessment & Plan:         Assessment & Plan:  Recommended trial of humidifier in his bedroom to see if this helps with dry mouth.

## 2013-01-08 NOTE — Assessment & Plan Note (Signed)
Obtain follow up A1C.   

## 2013-01-08 NOTE — Assessment & Plan Note (Signed)
BP Readings from Last 3 Encounters:  01/08/13 136/76  12/12/12 131/69  09/11/12 133/68   BP is stable on current meds.  Continue same.

## 2013-01-08 NOTE — Patient Instructions (Addendum)
Please return fasting this week to the lab for blood work. Follow up in 3 months.

## 2013-01-08 NOTE — Assessment & Plan Note (Signed)
Tolerating iron supplement.  Management per Hematology.

## 2013-01-08 NOTE — Assessment & Plan Note (Signed)
This is being managed by sports med- Dr. Barbaraann Barthel.  Pt feels that it is improved slightly.

## 2013-01-08 NOTE — Assessment & Plan Note (Signed)
Defer management to Dr. Janice Norrie.

## 2013-01-09 LAB — HEMOGLOBIN A1C
Hgb A1c MFr Bld: 6.3 % — ABNORMAL HIGH (ref <5.7)
Mean Plasma Glucose: 134 mg/dL — ABNORMAL HIGH (ref <117)

## 2013-01-09 LAB — HEPATIC FUNCTION PANEL
AST: 27 U/L (ref 0–37)
Albumin: 4 g/dL (ref 3.5–5.2)
Alkaline Phosphatase: 82 U/L (ref 39–117)
Total Protein: 7.3 g/dL (ref 6.0–8.3)

## 2013-01-09 LAB — BASIC METABOLIC PANEL WITH GFR
Chloride: 104 mEq/L (ref 96–112)
GFR, Est Non African American: 51 mL/min — ABNORMAL LOW
Potassium: 4 mEq/L (ref 3.5–5.3)

## 2013-01-10 LAB — URINALYSIS, ROUTINE W REFLEX MICROSCOPIC
Leukocytes, UA: NEGATIVE
Nitrite: NEGATIVE
Specific Gravity, Urine: 1.018 (ref 1.005–1.030)
pH: 5.5 (ref 5.0–8.0)

## 2013-01-11 ENCOUNTER — Encounter: Payer: Self-pay | Admitting: Family

## 2013-01-28 ENCOUNTER — Telehealth: Payer: Self-pay | Admitting: Hematology & Oncology

## 2013-01-28 NOTE — Telephone Encounter (Signed)
Left message moved 3-13 to 4-3

## 2013-02-13 ENCOUNTER — Ambulatory Visit: Payer: Medicare PPO | Admitting: Hematology & Oncology

## 2013-02-13 ENCOUNTER — Other Ambulatory Visit: Payer: Medicare PPO | Admitting: Lab

## 2013-02-14 ENCOUNTER — Telehealth: Payer: Self-pay | Admitting: Family

## 2013-02-14 MED ORDER — METRONIDAZOLE 500 MG PO TABS: ORAL_TABLET | ORAL | Status: DC

## 2013-02-14 NOTE — Telephone Encounter (Signed)
Wife + for trichomonas. She requested that I contact pt to discuss. Reviewed with and instructed him on need to be treated with metronidazole and not to drink alcohol when taking. He verbalizes understanding.

## 2013-03-06 ENCOUNTER — Other Ambulatory Visit (HOSPITAL_BASED_OUTPATIENT_CLINIC_OR_DEPARTMENT_OTHER): Payer: Medicare PPO | Admitting: Lab

## 2013-03-06 ENCOUNTER — Ambulatory Visit (HOSPITAL_BASED_OUTPATIENT_CLINIC_OR_DEPARTMENT_OTHER): Payer: Medicare PPO | Admitting: Medical

## 2013-03-06 VITALS — BP 122/61 | HR 59 | Temp 97.5°F | Resp 18 | Ht 73.0 in | Wt 226.0 lb

## 2013-03-06 DIAGNOSIS — D72819 Decreased white blood cell count, unspecified: Secondary | ICD-10-CM

## 2013-03-06 DIAGNOSIS — D509 Iron deficiency anemia, unspecified: Secondary | ICD-10-CM

## 2013-03-06 DIAGNOSIS — N189 Chronic kidney disease, unspecified: Secondary | ICD-10-CM

## 2013-03-06 DIAGNOSIS — D649 Anemia, unspecified: Secondary | ICD-10-CM

## 2013-03-06 DIAGNOSIS — N289 Disorder of kidney and ureter, unspecified: Secondary | ICD-10-CM

## 2013-03-06 LAB — CBC WITH DIFFERENTIAL (CANCER CENTER ONLY)
BASO#: 0 10*3/uL (ref 0.0–0.2)
EOS%: 8.7 % — ABNORMAL HIGH (ref 0.0–7.0)
Eosinophils Absolute: 0.3 10*3/uL (ref 0.0–0.5)
HGB: 11.1 g/dL — ABNORMAL LOW (ref 13.0–17.1)
LYMPH#: 1.5 10*3/uL (ref 0.9–3.3)
MCHC: 32.8 g/dL (ref 32.0–35.9)
NEUT#: 1.5 10*3/uL (ref 1.5–6.5)
RBC: 3.56 10*6/uL — ABNORMAL LOW (ref 4.20–5.70)

## 2013-03-06 NOTE — Progress Notes (Signed)
DIAGNOSIS:  Multifactorial anemia, renal insufficiency/iron deficiency/congestive heart failure.  CURRENT THERAPY:  Oral iron (the patient given samples as we have them).  INTERIM HISTORY: David [name] presents today for an office followup visit.  Overall, he, reports, that he is doing relatively well.  He remains on oral iron supplementation.  His hemoglobin was 10.9.  2 months ago, it is 11.1, today.  His last iron panel.  Back in January, revealed an iron of 57, with 22% saturation.  His ferritin level was 406.  We are checking an erythropoietin level on him.  He is on a period of medications.  He is active.  Every day.  He is retired, but still continues to work.  He does not complain of any excessive fatigue or weakness.  He does not complain of craving any ice.  He has a good appetite.  He denies any nausea, vomiting, diarrhea, constipation, chest pain, shortness of breath, or cough.  He denies any fevers, chills, or night sweats.  He denies any lower leg swelling.  He denies any melena, or hematochezia, or any other obvious, bleeding.  He denies any abdominal pain.  He denies any headaches, visual changes, or rashes.  Review of Systems: Constitutional:Negative for malaise/fatigue, fever, chills, weight loss, diaphoresis, activity change, appetite change, and unexpected weight change.  HEENT: Negative for double vision, blurred vision, visual loss, ear pain, tinnitus, congestion, rhinorrhea, epistaxis sore throat or sinus disease, oral pain/lesion, tongue soreness Respiratory: Negative for cough, chest tightness, shortness of breath, wheezing and stridor.  Cardiovascular: Negative for chest pain, palpitations, leg swelling, orthopnea, PND, DOE or claudication Gastrointestinal: Negative for nausea, vomiting, abdominal pain, diarrhea, constipation, blood in stool, melena, hematochezia, abdominal distention, anal bleeding, rectal pain, anorexia and hematemesis.  Genitourinary: Negative for dysuria,  frequency, hematuria,  Musculoskeletal: Negative for myalgias, back pain, joint swelling, arthralgias and gait problem.  Skin: Negative for rash, color change, pallor and wound.  Neurological:. Negative for dizziness/light-headedness, tremors, seizures, syncope, facial asymmetry, speech difficulty, weakness, numbness, headaches and paresthesias.  Hematological: Negative for adenopathy. Does not bruise/bleed easily.  Psychiatric/Behavioral:  Negative for depression, no loss of interest in normal activity or change in sleep pattern.   Physical Exam: This is a 72 year old, well-developed, well-nourished, African American, gentleman, in no obvious distress Vitals: Temperature 97.5 degrees, pulse 59, respirations 18, blood pressure 120/61, weight 226 pounds HEENT reveals a normocephalic, atraumatic skull, no scleral icterus, no oral lesions  Neck is supple without any cervical or supraclavicular adenopathy.  Lungs are clear to auscultation bilaterally. There are no wheezes, rales or rhonci Cardiac is regular rate and rhythm with a normal S1 and S2. There are no murmurs, rubs, or bruits.  Abdomen is soft with good bowel sounds, there is no palpable mass. There is no palpable hepatosplenomegaly. There is no palpable fluid wave.  Musculoskeletal no tenderness of the spine, ribs, or hips.  Extremities there are no clubbing, cyanosis, or edema.  Skin no petechia, purpura or ecchymosis Neurologic is nonfocal.  Laboratory Data: White count 3.7, hemoglobin 11.1, hematocrit 33.8, platelets 177,000  Current Outpatient Prescriptions on File Prior to Visit  Medication Sig Dispense Refill  . aspirin 325 MG EC tablet Take 325 mg by mouth daily.        . baclofen (LIORESAL) 10 MG tablet every morning.       . carvedilol (COREG) 25 MG tablet Take 25 mg by mouth 2 (two) times daily.        . dorzolamide (TRUSOPT) 2 %  ophthalmic solution 1 drop 3 (three) times daily.       . Fe Fum-Vit C-Vit B12-FA (TRIGELS-F)  460-60-0.01-1 MG CAPS Take 1 pill TWICE a day.  90 capsule  3  . finasteride (PROSCAR) 5 MG tablet Take 5 mg by mouth daily.        Marland Kitchen latanoprost (XALATAN) 0.005 % ophthalmic solution 1 drop at bedtime.       Marland Kitchen losartan-hydrochlorothiazide (HYZAAR) 100-12.5 MG per tablet Take 1 tablet by mouth daily.        Marland Kitchen spironolactone (ALDACTONE) 25 MG tablet Take 25 mg by mouth daily.        Marland Kitchen terazosin (HYTRIN) 10 MG capsule Take 10 mg by mouth at bedtime.        . timolol (TIMOPTIC) 0.5 % ophthalmic solution 1 drop daily.       Marland Kitchen UNABLE TO FIND Pt takes"Iron Pill" once a day. dose and name???       No current facility-administered medications on file prior to visit.   Assessment/Plan: This is a pleasant, 72 year old, African American, gentleman, with the following issues:  #1.  Multifactorial anemia.  He does have renal insufficiency, as well as iron deficiency anemia.  We are checking an erythropoietin level on him today.  We are also checking an iron panel on him today.  For now, he will continue with his oral iron supplementation.  #2.  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  Followup.  We will follow back up with David [name] in 2 months, but before then should there be questions or concerns.

## 2013-03-07 LAB — ERYTHROPOIETIN: Erythropoietin: 7.1 m[IU]/mL (ref 2.6–18.5)

## 2013-03-07 LAB — IRON AND TIBC
%SAT: 32 % (ref 20–55)
UIBC: 167 ug/dL (ref 125–400)

## 2013-03-07 LAB — RETICULOCYTES (CHCC)
ABS Retic: 35.5 10*3/uL (ref 19.0–186.0)
RBC.: 3.55 MIL/uL — ABNORMAL LOW (ref 4.22–5.81)

## 2013-03-07 LAB — FERRITIN: Ferritin: 440 ng/mL — ABNORMAL HIGH (ref 22–322)

## 2013-03-27 ENCOUNTER — Ambulatory Visit (INDEPENDENT_AMBULATORY_CARE_PROVIDER_SITE_OTHER): Payer: Medicare PPO | Admitting: Family Medicine

## 2013-03-27 ENCOUNTER — Telehealth: Payer: Self-pay | Admitting: Family

## 2013-03-27 ENCOUNTER — Encounter: Payer: Self-pay | Admitting: Family Medicine

## 2013-03-27 VITALS — BP 125/75 | HR 74 | Temp 98.6°F | Resp 16 | Wt 215.0 lb

## 2013-03-27 DIAGNOSIS — R300 Dysuria: Secondary | ICD-10-CM

## 2013-03-27 DIAGNOSIS — R8290 Unspecified abnormal findings in urine: Secondary | ICD-10-CM

## 2013-03-27 DIAGNOSIS — N410 Acute prostatitis: Secondary | ICD-10-CM

## 2013-03-27 DIAGNOSIS — R82998 Other abnormal findings in urine: Secondary | ICD-10-CM

## 2013-03-27 LAB — POCT URINALYSIS DIPSTICK
Bilirubin, UA: NEGATIVE
Glucose, UA: NEGATIVE
Nitrite, UA: NEGATIVE
Urobilinogen, UA: 0.2

## 2013-03-27 MED ORDER — SULFAMETHOXAZOLE-TMP DS 800-160 MG PO TABS: 1.0000 | ORAL_TABLET | Freq: Two times a day (BID) | ORAL | Status: DC

## 2013-03-27 NOTE — Addendum Note (Signed)
Addended by: Tammi Sou on: 03/27/2013 11:27 AM   Modules accepted: Orders

## 2013-03-27 NOTE — Progress Notes (Addendum)
OFFICE NOTE  03/27/2013  CC:  Chief Complaint  Patient presents with  . Urinary Tract Infection    Pt c/o burning with urination, trouble with urination flow x2 days     HPI: Patient is a 72 y.o. African-American male who is here for burning with urination. Onset 2-3 days ago of burning with every urination and it also feels like it is hard to get the urine out.  Hesitancy, stream starts and stops. No blood in urine.  No incomplete emptying feeling.   Hx of prostate cancer treated with seed implants, most recent f/u with Dr. Janice Norrie was a couple of months ago and PSA was good and prostate exam was good.   Has chronic LBP and left leg radiculopathy but denies any flank or groin pain with this illness.   Pertinent PMH:  Past Medical History  Diagnosis Date  . Hypertension     followed by [facility] (Dr Dani Gobble Croitoru)  . Hyperlipidemia   . Cancer     hx of prostate; s/p radioactive seed implant 10/2009 Dr Janice Norrie  . 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 History of cardiac cath 2004    negative for CAD  . History of myocardial perfusion scan 02/2010    negative for coronary insufficiency (LVEF 27%)  . Anemia, iron deficiency 11/15/2011   Past Surgical History  Procedure Laterality Date  . Radioactive seed implant  2010    prostate cancer    MEDS:  Outpatient Prescriptions Prior to Visit  Medication Sig Dispense Refill  . aspirin 325 MG EC tablet Take 325 mg by mouth daily.        . baclofen (LIORESAL) 10 MG tablet every morning.       . carvedilol (COREG) 25 MG tablet Take 25 mg by mouth 2 (two) times daily.        . dorzolamide (TRUSOPT) 2 % ophthalmic solution 1 drop 3 (three) times daily.       . Fe Fum-Vit C-Vit B12-FA (TRIGELS-F) 460-60-0.01-1 MG CAPS Take 1 capsule by mouth daily.      . finasteride (PROSCAR) 5 MG tablet Take 5 mg by mouth daily.        Marland Kitchen latanoprost (XALATAN) 0.005 %  ophthalmic solution 1 drop at bedtime.       Marland Kitchen losartan-hydrochlorothiazide (HYZAAR) 100-12.5 MG per tablet Take 1 tablet by mouth daily.        Marland Kitchen spironolactone (ALDACTONE) 25 MG tablet Take 25 mg by mouth daily.        Marland Kitchen terazosin (HYTRIN) 10 MG capsule Take 10 mg by mouth at bedtime.        . timolol (TIMOPTIC) 0.5 % ophthalmic solution 1 drop daily.        No facility-administered medications prior to visit.    PE: Blood pressure 125/75, pulse 74, temperature 98.6 F (37 C), temperature source Oral, resp. rate 16, weight 215 lb (97.523 kg), SpO2 98.00%. Gen: Alert, well appearing.  Patient is oriented to person, place, time, and situation. CV: RRR, no m/r/g.   LUNGS: CTA bilat, nonlabored resps, good aeration in all lung fields. ABD: soft, NT, ND, BS normal.  No hepatospenomegaly or mass.  No bruits. No CVA tenderness. DRE: prostate firm diffusely, with definite right sided tenderness.  LAB: CC UA today showed mod blood, 30mg /dl protein, and small LEU, otherwise normal.  IMPRESSION AND PLAN:  Acute prostatitis: send urine for c/s.  Start bactrim DS bid x 14d. He says he is "not sure" if his wife could have given him an STD (she apparently had an infection a couple of weeks ago that her gyn also treated HIM for--metronidazole 4 tab regimen--but he had no symptoms at that time). Will send appropriate urine sample for GC/Chlamydia probe today.  FOLLOW UP: prn

## 2013-03-27 NOTE — Telephone Encounter (Signed)
WIFE WAS IN FOR AN STD.  MELISSA GAVE MR Chiaramonte 4 PILLS TO TAKE.  HE IS HAVING ISSUES WITH BURNING WHEN HE URINATES.  HE DOES NOT REMEMBER THE NAME OF THE MEDICINE  CALL HIM AT [phone].  SPOKE WITH CHRISTY AND SHE ADVISED I SHOULD SEND HIM TO DR MCGOWEN AS HE MAY HAVE A UTI.  [place]

## 2013-03-28 LAB — GC/CHLAMYDIA PROBE AMP, URINE: Chlamydia, Swab/Urine, PCR: NEGATIVE

## 2013-03-29 LAB — URINE CULTURE
Colony Count: NO GROWTH
Organism ID, Bacteria: NO GROWTH

## 2013-04-09 ENCOUNTER — Ambulatory Visit: Payer: Medicare PPO | Admitting: Family

## 2013-04-16 ENCOUNTER — Ambulatory Visit (INDEPENDENT_AMBULATORY_CARE_PROVIDER_SITE_OTHER): Payer: Medicare PPO | Admitting: Family

## 2013-04-16 ENCOUNTER — Encounter: Payer: Self-pay | Admitting: Family

## 2013-04-16 VITALS — BP 118/80 | HR 69 | Temp 98.0°F | Resp 16 | Ht 73.5 in | Wt 214.1 lb

## 2013-04-16 DIAGNOSIS — R300 Dysuria: Secondary | ICD-10-CM

## 2013-04-16 DIAGNOSIS — R739 Hyperglycemia, unspecified: Secondary | ICD-10-CM

## 2013-04-16 DIAGNOSIS — D509 Iron deficiency anemia, unspecified: Secondary | ICD-10-CM

## 2013-04-16 DIAGNOSIS — E785 Hyperlipidemia, unspecified: Secondary | ICD-10-CM

## 2013-04-16 DIAGNOSIS — I519 Heart disease, unspecified: Secondary | ICD-10-CM

## 2013-04-16 DIAGNOSIS — R7309 Other abnormal glucose: Secondary | ICD-10-CM

## 2013-04-16 DIAGNOSIS — I10 Essential (primary) hypertension: Secondary | ICD-10-CM

## 2013-04-16 LAB — POCT URINALYSIS DIPSTICK
Ketones, UA: NEGATIVE
Spec Grav, UA: 1.02
Urobilinogen, UA: 0.2
pH, UA: 6

## 2013-04-16 LAB — BASIC METABOLIC PANEL
BUN: 40 mg/dL — ABNORMAL HIGH (ref 6–23)
CO2: 18 mEq/L — ABNORMAL LOW (ref 19–32)
Chloride: 112 mEq/L (ref 96–112)
Glucose, Bld: 114 mg/dL — ABNORMAL HIGH (ref 70–99)
Potassium: 5.7 mEq/L — ABNORMAL HIGH (ref 3.5–5.3)

## 2013-04-16 MED ORDER — CIPROFLOXACIN HCL 500 MG PO TABS: 500.0000 mg | ORAL_TABLET | Freq: Two times a day (BID) | ORAL | Status: DC

## 2013-04-16 NOTE — Assessment & Plan Note (Signed)
Pt appears euvolemic on exam.

## 2013-04-16 NOTE — Assessment & Plan Note (Signed)
Stable with diet alone, monitor.

## 2013-04-16 NOTE — Assessment & Plan Note (Signed)
Symptoms improved but not resolved.  Urine today is + for blood and leuks.  Send for culture.  I have asked pt to call his urologist to arrange a sooner follow up apt.  Plan rx with cipro for possible UTI/prostatitis.

## 2013-04-16 NOTE — Assessment & Plan Note (Signed)
Bp stable on current meds.  Continue same, obtain bmet.

## 2013-04-16 NOTE — Patient Instructions (Addendum)
Please call Dr. Sammie Bench office to request an earlier appointment. Start cipo. Go to the ER if you are unable to urinate. Complete lab work prior to leaving. Follow up in 3 months, sooner if problems/concerns.

## 2013-04-16 NOTE — Progress Notes (Signed)
Subjective:    Patient ID: David [name], male    DOB: August 20, 1941, 72 y.o.   MRN: [id number]  HPI  David [name] is a 72 yr old male who presents today for follow up of multiple medical problems.  1) Dysuria- saw Dr. Anitra Lauth.  Was treated with bactrim DS for prostatitis about 3 weeks ago. Urine culture was negative and GC/Chlamydia was negative. Wife tested + for trichomonas in mid March and he was prescribed empiric Metronidazole.  He reports that he did take metronidazole.  Reports symptoms are improved but not resolved.  Reports burning sensation at the beginning of the urinary stream.  Has sense of incomplete emptying.  He sees Dr. Janice Norrie due to hx of prostate cancer.    2) HTN-  He continues carvedilol, losartan-hctz. Denies CP/SOB swelling.  3) Hyperlipidemia-  This is diet controlled and his LDL was at goal (92) back in February.   4) Hyperglycemia- chart review notes several mildly elevated sugars.   5) Iron deficiency anemia- he is following with Dr. Antonieta Pert office for this.   Review of Systems    see HPI  Past Medical History  Diagnosis Date  . Hypertension     followed by [facility] (Dr Dani Gobble Croitoru)  . Hyperlipidemia   . Cancer     hx of prostate; s/p radioactive seed implant 10/2009 Dr Janice Norrie  . 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 History of cardiac cath 2004    negative for CAD  . History of myocardial perfusion scan 02/2010    negative for coronary insufficiency (LVEF 27%)  . Anemia, iron deficiency 11/15/2011    History   Social History  . Marital Status: Married    Spouse Name: N/A    Number of Children: 2  . Years of Education: N/A   Occupational History  . retired    Social History Main Topics  . Smoking status: Former Smoker    Quit date: 12/04/2005  . Smokeless tobacco: Never Used  . Alcohol Use: Yes  . Drug Use: Not on file  . Sexually Active: Not on file    Other Topics Concern  . Not on file   Social History Narrative   Stays active at home   Regular exercise: no    Past Surgical History  Procedure Laterality Date  . Radioactive seed implant  2010    prostate cancer    Family History  Problem Relation Age of Onset  . Hypertension Mother   . Thyroid disease Mother   . Cholelithiasis Daughter   . Cholelithiasis Son   . Hypertension Maternal Grandmother   . Diabetes Maternal Grandmother   . Heart attack Neg Hx   . Hyperlipidemia Neg Hx     No Known Allergies  Current Outpatient Prescriptions on File Prior to Visit  Medication Sig Dispense Refill  . aspirin 325 MG EC tablet Take 325 mg by mouth daily.        . baclofen (LIORESAL) 10 MG tablet every morning.       . carvedilol (COREG) 25 MG tablet Take 25 mg by mouth 2 (two) times daily.        . dorzolamide (TRUSOPT) 2 % ophthalmic solution 1 drop 3 (three) times daily.       . Fe Fum-Vit C-Vit B12-FA (TRIGELS-F) 460-60-0.01-1 MG CAPS Take 1 capsule by mouth daily.      . finasteride (PROSCAR) 5 MG  tablet Take 5 mg by mouth daily.        Marland Kitchen latanoprost (XALATAN) 0.005 % ophthalmic solution 1 drop at bedtime.       Marland Kitchen losartan-hydrochlorothiazide (HYZAAR) 100-12.5 MG per tablet Take 1 tablet by mouth daily.        Marland Kitchen spironolactone (ALDACTONE) 25 MG tablet Take 25 mg by mouth daily.        Marland Kitchen sulfamethoxazole-trimethoprim (BACTRIM DS) 800-160 MG per tablet Take 1 tablet by mouth 2 (two) times daily.  28 tablet  0  . terazosin (HYTRIN) 10 MG capsule Take 10 mg by mouth at bedtime.        . timolol (TIMOPTIC) 0.5 % ophthalmic solution 1 drop daily.        No current facility-administered medications on file prior to visit.    BP 118/80  Pulse 69  Temp(Src) 98 F (36.7 C) (Oral)  Resp 16  Ht 6' 1.5" (1.867 m)  Wt 214 lb 1.9 oz (97.124 kg)  BMI 27.86 kg/m2  SpO2 99%    Objective:   Physical Exam  Constitutional: He is oriented to person, place, and time. He appears  well-developed and well-nourished. No distress.  HENT:  Head: Normocephalic and atraumatic.  Cardiovascular: Normal rate and regular rhythm.   No murmur heard. Pulmonary/Chest: Effort normal and breath sounds normal. No respiratory distress. He has no wheezes. He has no rales. He exhibits no tenderness.  Musculoskeletal: He exhibits no edema.  Lymphadenopathy:    He has no cervical adenopathy.  Neurological: He is alert and oriented to person, place, and time.  Psychiatric: He has a normal mood and affect. His behavior is normal. Judgment and thought content normal.          Assessment & Plan:

## 2013-04-16 NOTE — Assessment & Plan Note (Signed)
Clinically stable. Management per Hematology.

## 2013-04-18 ENCOUNTER — Telehealth: Payer: Self-pay | Admitting: Family

## 2013-04-18 DIAGNOSIS — E875 Hyperkalemia: Secondary | ICD-10-CM

## 2013-04-18 DIAGNOSIS — N289 Disorder of kidney and ureter, unspecified: Secondary | ICD-10-CM

## 2013-04-18 LAB — URINE CULTURE: Organism ID, Bacteria: NO GROWTH

## 2013-04-18 NOTE — Telephone Encounter (Signed)
Notified pt and he voices understanding. He states he will call back to arrange his 1 month follow up. Future order entered for bmp.

## 2013-04-18 NOTE — Telephone Encounter (Signed)
Please call pt and let him know potassium is high.  He should stop aldactone and repeat BMET in 1 week (dx hyperkalemia).  Also, his kidney function is impaired.  It had looked better last visit but now seems to have worsened again.  I would like to have him establish with a kidney doctor.  I would like to see him back in the office in 1 month.

## 2013-04-24 LAB — BASIC METABOLIC PANEL
BUN: 31 mg/dL — ABNORMAL HIGH (ref 6–23)
Potassium: 4.1 mEq/L (ref 3.5–5.3)
Sodium: 140 mEq/L (ref 135–145)

## 2013-04-26 ENCOUNTER — Encounter: Payer: Self-pay | Admitting: Family

## 2013-05-06 ENCOUNTER — Telehealth: Payer: Self-pay | Admitting: Hematology & Oncology

## 2013-05-06 NOTE — Telephone Encounter (Signed)
Pt called cx 6-4 said would call back to reschedule. RN aware pt didn't sound like he really wanted to reschedule

## 2013-05-07 ENCOUNTER — Other Ambulatory Visit: Payer: Medicare PPO | Admitting: Lab

## 2013-05-07 ENCOUNTER — Ambulatory Visit: Payer: Medicare PPO | Admitting: Medical

## 2013-05-20 ENCOUNTER — Other Ambulatory Visit: Payer: Self-pay | Admitting: *Deleted

## 2013-05-20 MED ORDER — LOSARTAN POTASSIUM-HCTZ 100-12.5 MG PO TABS: 1.0000 | ORAL_TABLET | Freq: Every day | ORAL | Status: DC

## 2013-05-20 NOTE — Telephone Encounter (Signed)
Returned call.  Pt stated he has been trying to get a refill on losartan-hctz for two weeks.  Informed no refill requests received and Rx will be sent.  Refill(s) sent to pharmacy.  [facility].

## 2013-07-16 ENCOUNTER — Ambulatory Visit: Payer: Medicare PPO | Admitting: Family

## 2013-07-23 ENCOUNTER — Ambulatory Visit (INDEPENDENT_AMBULATORY_CARE_PROVIDER_SITE_OTHER): Payer: Medicare PPO | Admitting: Family

## 2013-07-23 ENCOUNTER — Encounter: Payer: Self-pay | Admitting: Family

## 2013-07-23 VITALS — BP 116/80 | HR 75 | Temp 97.9°F | Resp 18 | Ht 73.5 in | Wt 217.0 lb

## 2013-07-23 DIAGNOSIS — R739 Hyperglycemia, unspecified: Secondary | ICD-10-CM

## 2013-07-23 DIAGNOSIS — I10 Essential (primary) hypertension: Secondary | ICD-10-CM

## 2013-07-23 DIAGNOSIS — E785 Hyperlipidemia, unspecified: Secondary | ICD-10-CM

## 2013-07-23 DIAGNOSIS — C61 Malignant neoplasm of prostate: Secondary | ICD-10-CM

## 2013-07-23 DIAGNOSIS — R7309 Other abnormal glucose: Secondary | ICD-10-CM

## 2013-07-23 LAB — BASIC METABOLIC PANEL
CO2: 27 mEq/L (ref 19–32)
Glucose, Bld: 102 mg/dL — ABNORMAL HIGH (ref 70–99)
Potassium: 4.1 mEq/L (ref 3.5–5.3)
Sodium: 140 mEq/L (ref 135–145)

## 2013-07-23 LAB — HEMOGLOBIN A1C: Hgb A1c MFr Bld: 6 % — ABNORMAL HIGH (ref <5.7)

## 2013-07-23 NOTE — Assessment & Plan Note (Signed)
LDL at goal with diet alone. Monitor.

## 2013-07-23 NOTE — Assessment & Plan Note (Signed)
?   Cause for dry mouth.  Obtain follow up A1C.

## 2013-07-23 NOTE — Patient Instructions (Addendum)
Please complete your lab work prior to leaving. Follow up in 3 months.   

## 2013-07-23 NOTE — Progress Notes (Signed)
Subjective:    Patient ID: David [name], male    DOB: [birth year], 72 y.o.   MRN: [id number]  HPI  David [name] is a 72 yr old male who presents today for follow up.  1) HTN- currently maintained on coreg and hyzaar.  He denies CP/SOB or swelling.    2) Hyperlipidemia- last LDL in February was 92.  3) Decreased saliva- He reports that this has been going on for a few weeks.  Works outside cutting grass.  Reports that he is drinking plenty of fluids.     Review of Systems See HPI  Past Medical History  Diagnosis Date  . Hypertension     followed by [facility] (Dr Dani Gobble Croitoru)  . Hyperlipidemia   . Cancer     hx of prostate; s/p radioactive seed implant 10/2009 Dr Janice Norrie  . 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 History of cardiac cath 2004    negative for CAD  . History of myocardial perfusion scan 02/2010    negative for coronary insufficiency (LVEF 27%)  . Anemia, iron deficiency 11/15/2011    History   Social History  . Marital Status: Married    Spouse Name: N/A    Number of Children: 2  . Years of Education: N/A   Occupational History  . retired    Social History Main Topics  . Smoking status: Former Smoker    Quit date: 12/04/2005  . Smokeless tobacco: Never Used  . Alcohol Use: Yes  . Drug Use: Not on file  . Sexual Activity: Not on file   Other Topics Concern  . Not on file   Social History Narrative   Stays active at home   Regular exercise: no    Past Surgical History  Procedure Laterality Date  . Radioactive seed implant  2010    prostate cancer    Family History  Problem Relation Age of Onset  . Hypertension Mother   . Thyroid disease Mother   . Cholelithiasis Daughter   . Cholelithiasis Son   . Hypertension Maternal Grandmother   . Diabetes Maternal Grandmother   . Heart attack Neg Hx   . Hyperlipidemia Neg Hx     Allergies  Allergen Reactions   . Cozaar [Losartan Potassium] Other (See Comments)    constipation    Current Outpatient Prescriptions on File Prior to Visit  Medication Sig Dispense Refill  . aspirin 325 MG EC tablet Take 325 mg by mouth daily.        . carvedilol (COREG) 25 MG tablet Take 25 mg by mouth 2 (two) times daily.        . dorzolamide (TRUSOPT) 2 % ophthalmic solution 1 drop 3 (three) times daily.       . Fe Fum-Vit C-Vit B12-FA (TRIGELS-F) 460-60-0.01-1 MG CAPS Take 1 capsule by mouth daily.      . finasteride (PROSCAR) 5 MG tablet Take 5 mg by mouth daily.        Marland Kitchen latanoprost (XALATAN) 0.005 % ophthalmic solution 1 drop at bedtime.       Marland Kitchen losartan-hydrochlorothiazide (HYZAAR) 100-12.5 MG per tablet Take 1 tablet by mouth daily.  90 tablet  1  . terazosin (HYTRIN) 10 MG capsule Take 10 mg by mouth at bedtime.        . timolol (TIMOPTIC) 0.5 % ophthalmic solution 1 drop daily.        No current  facility-administered medications on file prior to visit.    BP 116/80  Pulse 75  Temp(Src) 97.9 F (36.6 C) (Oral)  Resp 18  Ht 6' 1.5" (1.867 m)  Wt 217 lb (98.431 kg)  BMI 28.24 kg/m2  SpO2 99%       Objective:   Physical Exam  Constitutional: He is oriented to person, place, and time. He appears well-developed and well-nourished. No distress.  HENT:  Head: Normocephalic and atraumatic.  Cardiovascular: Normal rate and regular rhythm.   No murmur heard. Pulmonary/Chest: Effort normal and breath sounds normal. No respiratory distress. He has no wheezes. He has no rales. He exhibits no tenderness.  Musculoskeletal: He exhibits no edema.  Lymphadenopathy:    He has no cervical adenopathy.  Neurological: He is alert and oriented to person, place, and time.  Psychiatric: He has a normal mood and affect. His behavior is normal. Judgment and thought content normal.          Assessment & Plan:

## 2013-07-23 NOTE — Assessment & Plan Note (Signed)
Pt is s/p Radioactive seed implant in 2010- follows with Dr. Janice Norrie- had apt this AM with him.

## 2013-07-23 NOTE — Assessment & Plan Note (Signed)
Bp stable on current meds.  Continue same, obtain bmet.

## 2013-07-25 ENCOUNTER — Telehealth: Payer: Self-pay | Admitting: Family

## 2013-07-25 NOTE — Telephone Encounter (Signed)
Spoke to pt. Re: nephrology apt. Discussed importance of meeting with nephrologist for his CRI.  He verbalizes understanding but states that he cannot afford the copay.  I advised pt to follow through with referral as soon as he is able and he verbalizes understanding.

## 2013-08-07 ENCOUNTER — Telehealth: Payer: Self-pay | Admitting: Family

## 2013-08-07 DIAGNOSIS — N289 Disorder of kidney and ureter, unspecified: Secondary | ICD-10-CM

## 2013-08-07 NOTE — Telephone Encounter (Signed)
Mr marksberry was scheduled in July to see [facility].  He did not go at that time.  We need a new referral and he wants to go to [facility]

## 2013-09-07 ENCOUNTER — Encounter: Payer: Self-pay | Admitting: *Deleted

## 2013-09-09 ENCOUNTER — Ambulatory Visit (INDEPENDENT_AMBULATORY_CARE_PROVIDER_SITE_OTHER): Payer: Medicare PPO | Admitting: Cardiovascular Disease

## 2013-09-09 ENCOUNTER — Encounter: Payer: Self-pay | Admitting: Cardiovascular Disease

## 2013-09-09 VITALS — BP 138/72 | HR 61 | Ht 74.0 in | Wt 217.7 lb

## 2013-09-09 DIAGNOSIS — I10 Essential (primary) hypertension: Secondary | ICD-10-CM

## 2013-09-09 DIAGNOSIS — I428 Other cardiomyopathies: Secondary | ICD-10-CM

## 2013-09-09 DIAGNOSIS — I519 Heart disease, unspecified: Secondary | ICD-10-CM

## 2013-09-09 DIAGNOSIS — E785 Hyperlipidemia, unspecified: Secondary | ICD-10-CM

## 2013-09-09 DIAGNOSIS — K117 Disturbances of salivary secretion: Secondary | ICD-10-CM

## 2013-09-09 DIAGNOSIS — I420 Dilated cardiomyopathy: Secondary | ICD-10-CM

## 2013-09-09 DIAGNOSIS — R682 Dry mouth, unspecified: Secondary | ICD-10-CM

## 2013-09-09 DIAGNOSIS — R7309 Other abnormal glucose: Secondary | ICD-10-CM

## 2013-09-09 DIAGNOSIS — N289 Disorder of kidney and ureter, unspecified: Secondary | ICD-10-CM

## 2013-09-09 NOTE — Patient Instructions (Addendum)
Your physician recommends that you schedule a follow-up appointment in: 12 months.  

## 2013-09-10 DIAGNOSIS — R682 Dry mouth, unspecified: Secondary | ICD-10-CM | POA: Insufficient documentation

## 2013-09-10 DIAGNOSIS — I420 Dilated cardiomyopathy: Secondary | ICD-10-CM | POA: Insufficient documentation

## 2013-09-10 LAB — SJOGRENS SYNDROME-A EXTRACTABLE NUCLEAR ANTIBODY: SSA (Ro) (ENA) Antibody, IgG: 56 AU/mL — ABNORMAL HIGH (ref <30)

## 2013-09-10 LAB — SJOGRENS SYNDROME-B EXTRACTABLE NUCLEAR ANTIBODY: SSB (La) (ENA) Antibody, IgG: <1 AU/mL (ref <30)

## 2013-09-10 NOTE — Assessment & Plan Note (Signed)
Glycohemoglobin shows satisfactory control off meds.

## 2013-09-10 NOTE — Assessment & Plan Note (Signed)
NYHA class I-II, clinically euvolemic. He has had a steady improvement with medical Rx, suggesting that at least a large component of his cardiomyopathy was insufficienctly treated HTN. Spironolactone was stopped several months ago for worsening renal function and hyperkalemia. Due to see a Nephrologist per his report (does not know name). No changes are made to his HF regimen. 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

## 2013-09-10 NOTE — Assessment & Plan Note (Signed)
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 Consider renal duplex US if continues to worsen. Agree with nephrology referral.

## 2013-09-10 NOTE — Assessment & Plan Note (Signed)
LDL<100 without meds, courtesy of his successful weight loss.

## 2013-09-10 NOTE — Assessment & Plan Note (Signed)
Check antibodies for Sjogren's sd.

## 2013-09-10 NOTE — Progress Notes (Signed)
Patient ID: David [name], male   DOB: [birth year], 72 y.o.   MRN: [id number]      Reason for office visit CHF, HTN, DM, Hyperlipidemia  David [name] continues to have excellent control of his CHF despite no diuretic therapy. He has lost weight and no longer needs medication for either DM or hyperlipidemia. 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  In the past his LVEF has been as low as 30%, but it improved gradually with medical therapy. His last echo in March 2013 showed an EF of 45-50%. He had normal coronaries at angigraphy in 2004.  He has had a persistent complaint of dry mouth despite good glycemic control and no clear etiology. It is becoming hard to chew and swallow food.   Allergies  Allergen Reactions  . Cozaar [Losartan Potassium] Other (See Comments)    constipation    Current Outpatient Prescriptions  Medication Sig Dispense Refill  . acetaminophen (TYLENOL) 500 MG tablet Take 500 mg by mouth 2 (two) times daily.      Marland Kitchen aspirin 325 MG EC tablet Take 325 mg by mouth daily.        . carvedilol (COREG) 25 MG tablet Take 25 mg by mouth 2 (two) times daily.        . dorzolamide (TRUSOPT) 2 % ophthalmic solution 1 drop 3 (three) times daily.       . finasteride (PROSCAR) 5 MG tablet Take 5 mg by mouth daily.        Marland Kitchen latanoprost (XALATAN) 0.005 % ophthalmic solution 1 drop at bedtime.       Marland Kitchen losartan-hydrochlorothiazide (HYZAAR) 100-12.5 MG per tablet Take 1 tablet by mouth daily.  90 tablet  1  . terazosin (HYTRIN) 10 MG capsule Take 10 mg by mouth at bedtime.        . timolol (TIMOPTIC) 0.5 % ophthalmic solution 1 drop daily.        No current facility-administered medications for this visit.    Past Medical History  Diagnosis Date  . Hypertension     followed by [name] (Dr Dani Gobble Stephany Poorman)  . Hyperlipidemia   . Cancer     hx of prostate; s/p radioactive seed implant 10/2009 Dr Janice Norrie  . 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 History of cardiac cath 2004    negative for CAD  . History of myocardial perfusion scan 02/2010    negative for coronary insufficiency (LVEF 27%)  . Anemia, iron deficiency 11/15/2011  . Nonischemic cardiomyopathy   . Sinus bradycardia     Past Surgical History  Procedure Laterality Date  . Radioactive seed implant  2010    prostate cancer  . Cardiac catheterization  02/04/2003    mildly depressed LV systolic fx EF [id number] coronaries/abdominal aorta/renal arteries.  . US echocardiography  02/06/2012    mild LVH,LA mod. dilated,mild-mod. 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 AI.  Marland Kitchen Nm myocar perf wall motion  02/21/2010    normal    Family History  Problem Relation Age of Onset  . Hypertension Mother   . Thyroid disease Mother   . Cholelithiasis Daughter   . Cholelithiasis Son   . Hypertension Maternal Grandmother   . Diabetes Maternal Grandmother   . Heart attack Neg Hx   . Hyperlipidemia Neg Hx     History   Social History  .  Marital Status: Married    Spouse Name: N/A    Number of Children: 2  . Years of Education: N/A   Occupational History  . retired    Social History Main Topics  . Smoking status: Former Smoker    Quit date: 12/04/2005  . Smokeless tobacco: Never Used  . Alcohol Use: Yes  . Drug Use: Not on file  . Sexual Activity: Not on file   Other Topics Concern  . Not on file   Social History Narrative   Stays active at home   Regular exercise: no    Review of systems: The patient specifically denies any chest pain at rest or with exertion, dyspnea at rest or with exertion, orthopnea, paroxysmal nocturnal dyspnea, syncope, palpitations, focal neurological deficits, intermittent claudication, lower extremity edema, unexplained weight gain, cough, hemoptysis or wheezing.  The patient also denies abdominal pain, nausea, vomiting, dysphagia,  diarrhea, constipation, polyuria, polydipsia, dysuria, hematuria, frequency, urgency, abnormal bleeding or bruising, fever, chills, unexpected weight changes, mood swings, change in skin or hair texture, change in voice quality, auditory or visual problems, allergic reactions or rashes, new musculoskeletal complaints other than usual chronic leg pain. Dry mouth, no prominent dry eyes complaint.   PHYSICAL EXAM BP 138/72  Pulse 61  Ht 6\' 2"  (1.88 m)  Wt 217 lb 11.2 oz (98.748 kg)  BMI 27.94 kg/m2  General: Alert, oriented x3, no distress Head: no evidence of trauma, PERRL, EOMI, no exophtalmos or lid lag, no myxedema, no xanthelasma; normal ears, nose and oropharynx Neck: normal jugular venous pulsations and no hepatojugular reflux; brisk carotid pulses without delay and no carotid bruits Chest: clear to auscultation, no signs of consolidation by percussion or palpation, normal fremitus, symmetrical and full respiratory excursions Cardiovascular: normal position and quality of the apical impulse, regular rhythm, normal first and second heart sounds, no murmurs, rubs or gallops Abdomen: no tenderness or distention, no masses by palpation, no abnormal pulsatility or arterial bruits, normal bowel sounds, no hepatosplenomegaly Extremities: no clubbing, cyanosis or edema; 2+ radial, ulnar and brachial pulses bilaterally; 2+ right femoral, posterior tibial and dorsalis pedis pulses; 2+ left femoral, posterior tibial and dorsalis pedis pulses; no subclavian or femoral bruits Neurological: grossly nonfocal   EKG: normal sinus rhythm, LAFB  Lipid Panel     Component Value Date/Time   CHOL 145 01/09/2013 0846   TRIG 90 01/09/2013 0846   HDL 35* 01/09/2013 0846   CHOLHDL 4.1 01/09/2013 0846   VLDL 18 01/09/2013 0846   LDLCALC 92 01/09/2013 0846    BMET    Component Value Date/Time   NA 140 07/23/2013 0001   K 4.1 07/23/2013 0001   CL 106 07/23/2013 0001   CO2 27 07/23/2013 0001   GLUCOSE 102* 07/23/2013  0001   BUN 22 07/23/2013 0001   CREATININE 1.93* 07/23/2013 0001   CREATININE 1.79* 08/16/2011 1500   CALCIUM 9.2 07/23/2013 0001   GFRNONAA 56* 11/05/2009 1454   GFRAA  Value: >60        The eGFR has been calculated using the MDRD equation. This calculation has not been validated in all clinical situations. eGFR's persistently <60 mL/min signify possible Chronic Kidney Disease. 11/05/2009 1454     ASSESSMENT AND PLAN Nonischemic dilated cardiomyopathy NYHA class I-II, clinically euvolemic. He has had a steady improvement with medical Rx, suggesting that at least a large component of his cardiomyopathy was insufficienctly treated HTN. Spironolactone was stopped several months ago for worsening renal function and hyperkalemia. Due to  see a Nephrologist per his report (does not know name). No changes are made to his HF regimen. 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  HYPERTENSION Good control  HYPERGLYCEMIA Glycohemoglobin shows satisfactory control off meds.  HYPERLIPIDEMIA LDL<100 without meds, courtesy of his successful weight loss.  Dry mouth Check antibodies for Sjogren's sd.  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 Consider renal duplex US if continues to worsen. Agree with nephrology referral.   Orders Placed This Encounter  Procedures  . Sjogrens syndrome-A extractable nuclear antibody  . Sjogrens syndrome-B extractable nuclear antibody  . EKG 12-Lead   Meds ordered this encounter  Medications  . acetaminophen (TYLENOL) 500 MG tablet    Sig: Take 500 mg by mouth 2 (two) times daily.    Holli Humbles, MD, [place] [phone] office [phone] pager

## 2013-09-10 NOTE — Assessment & Plan Note (Signed)
Good control

## 2013-09-12 ENCOUNTER — Telehealth: Payer: Self-pay | Admitting: Family

## 2013-09-12 NOTE — Telephone Encounter (Signed)
Left message on home voicemail to return my call.

## 2013-09-12 NOTE — Telephone Encounter (Signed)
Message copied by [name] on Fri Sep 12, 2013  7:08 AM ------      Message from: [name]      Created: Wed Sep 10, 2013  2:54 PM       He kept complaining of a dry mouth so I ordered these. Not sure if titer is high enough, but may warrant referral to a Rheumatologist. ------

## 2013-09-12 NOTE — Telephone Encounter (Signed)
Please call pt and let him know that the blood work done by gastroenterology is elevated.  It is possible he has Sjogren's syndrome which is an autoimmune syndrome that can cause dry mouth.  I would like to have him evaluated by Rheumatology.

## 2013-09-16 ENCOUNTER — Other Ambulatory Visit: Payer: Self-pay | Admitting: Nephrology

## 2013-09-16 NOTE — Telephone Encounter (Signed)
Notified pt. He states that he is going for an imaging test on his kidneys Thursday. Also states that his urologist is doing extensive workup of his dry mouth as they think it may be related to a gland condition. Advised pt to verify if urologist is doing workup to check for Sjogren's Syndrome and if not he still needs to see Rheumatologist. Pt states he will call me back with outcome.

## 2013-09-17 ENCOUNTER — Encounter: Payer: Self-pay | Admitting: Family

## 2013-09-17 NOTE — Telephone Encounter (Signed)
Spoke with pt and advised him re: abnormal sjogren's testing and my recommendation for referral to rheumatology. He verbalizes understanding but declines referral due to financial reasons.  Advised pt to contact me when he is able to follow through with referral.

## 2013-09-17 NOTE — Telephone Encounter (Signed)
Urology sent me result from sjogren's testing and it was elevated. They have asked me to follow up on this.  I would like him to see Rheumatolgoy please.

## 2013-09-18 ENCOUNTER — Ambulatory Visit
Admission: RE | Admit: 2013-09-18 | Discharge: 2013-09-18 | Disposition: A | Discharge status: Home / Self Care | Payer: Commercial Managed Care - HMO | Source: Ambulatory Visit | Attending: Nephrology | Admitting: Nephrology

## 2013-10-22 ENCOUNTER — Ambulatory Visit (INDEPENDENT_AMBULATORY_CARE_PROVIDER_SITE_OTHER): Payer: Medicare PPO | Admitting: Family

## 2013-10-22 ENCOUNTER — Encounter: Payer: Self-pay | Admitting: Family

## 2013-10-22 VITALS — BP 150/82 | HR 67 | Temp 98.2°F | Resp 16 | Ht 73.5 in | Wt 216.0 lb

## 2013-10-22 DIAGNOSIS — I10 Essential (primary) hypertension: Secondary | ICD-10-CM

## 2013-10-22 DIAGNOSIS — I428 Other cardiomyopathies: Secondary | ICD-10-CM

## 2013-10-22 DIAGNOSIS — I420 Dilated cardiomyopathy: Secondary | ICD-10-CM

## 2013-10-22 DIAGNOSIS — R682 Dry mouth, unspecified: Secondary | ICD-10-CM

## 2013-10-22 DIAGNOSIS — R7309 Other abnormal glucose: Secondary | ICD-10-CM

## 2013-10-22 DIAGNOSIS — E785 Hyperlipidemia, unspecified: Secondary | ICD-10-CM

## 2013-10-22 DIAGNOSIS — K117 Disturbances of salivary secretion: Secondary | ICD-10-CM

## 2013-10-22 NOTE — Assessment & Plan Note (Signed)
At goal, continue low fat diet.

## 2013-10-22 NOTE — Assessment & Plan Note (Signed)
Last A1C is 6.  Plan repeat A1C next visit.

## 2013-10-22 NOTE — Assessment & Plan Note (Signed)
Clinically improved. Declines referral to rheumatology.

## 2013-10-22 NOTE — Assessment & Plan Note (Signed)
Clinically compensated. Defer management to cardiology.

## 2013-10-22 NOTE — Patient Instructions (Signed)
Please follow up in 3 months.  Keep upcoming appointment with the kidney doctor.

## 2013-10-22 NOTE — Progress Notes (Signed)
Pre visit review using our clinic review tool, if applicable. No additional management support is needed unless otherwise documented below in the visit note. 

## 2013-10-22 NOTE — Progress Notes (Signed)
Subjective:    Patient ID: David [name], male    DOB: 02-13-1941, 72 y.o.   MRN: [id number]  HPI  David [name] is a 72 yr old male who presents today for follow up of multiple medical problems.  1) HTN-  Pt reports that he has not yet taken his medications today.  He denies CP/SOB/Swelling BP Readings from Last 3 Encounters:  10/22/13 150/82  09/09/13 138/72  07/23/13 116/80    2) Hyperlipidemia- LDL at goal.  Not currently on cholesterol medication.  3) Hyperglycemia- last A1C 6.0. Flu shot up to date.  4) Dry mouth- pt still declines referral to rheumatology for + sjogren's antibodies.  Reports that his dry mouth has improved with increased fluid intake.   4) Renal insufficiency- saw nephrologist.  Has follow up in December.    5) Nonischemic cardiomyopathy- continues to follow with [facility]  Review of Systems    see HPI  Past Medical History  Diagnosis Date  . Hypertension     followed by [facility] (Dr Dani Gobble Croitoru)  . Hyperlipidemia   . Cancer     hx of prostate; s/p radioactive seed implant 10/2009 Dr Janice Norrie  . 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 History of cardiac cath 2004    negative for CAD  . History of myocardial perfusion scan 02/2010    negative for coronary insufficiency (LVEF 27%)  . Anemia, iron deficiency 11/15/2011  . Nonischemic cardiomyopathy   . Sinus bradycardia     History   Social History  . Marital Status: Married    Spouse Name: N/A    Number of Children: 2  . Years of Education: N/A   Occupational History  . retired    Social History Main Topics  . Smoking status: Former Smoker    Quit date: 12/04/2005  . Smokeless tobacco: Never Used  . Alcohol Use: Yes  . Drug Use: Not on file  . Sexual Activity: Not on file   Other Topics Concern  . Not on file   Social History Narrative   Stays active at home   Regular  exercise: no    Past Surgical History  Procedure Laterality Date  . Radioactive seed implant  2010    prostate cancer  . Cardiac catheterization  02/04/2003    mildly depressed LV systolic fx EF [id number] coronaries/abdominal aorta/renal arteries.  . US echocardiography  02/06/2012    mild LVH,LA mod. dilated,mild-mod. 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 AI.  Marland Kitchen Nm myocar perf wall motion  02/21/2010    normal    Family History  Problem Relation Age of Onset  . Hypertension Mother   . Thyroid disease Mother   . Cholelithiasis Daughter   . Cholelithiasis Son   . Hypertension Maternal Grandmother   . Diabetes Maternal Grandmother   . Heart attack Neg Hx   . Hyperlipidemia Neg Hx     Allergies  Allergen Reactions  . Cozaar [Losartan Potassium] Other (See Comments)    constipation    Current Outpatient Prescriptions on File Prior to Visit  Medication Sig Dispense Refill  . acetaminophen (TYLENOL) 500 MG tablet Take 500 mg by mouth 2 (two) times daily.      Marland Kitchen aspirin 325 MG EC tablet Take 325 mg by mouth daily.        . carvedilol (COREG) 25 MG tablet Take 25 mg  by mouth 2 (two) times daily.        . dorzolamide (TRUSOPT) 2 % ophthalmic solution 1 drop 3 (three) times daily.       . finasteride (PROSCAR) 5 MG tablet Take 5 mg by mouth daily.        Marland Kitchen latanoprost (XALATAN) 0.005 % ophthalmic solution 1 drop at bedtime.       Marland Kitchen losartan-hydrochlorothiazide (HYZAAR) 100-12.5 MG per tablet Take 1 tablet by mouth daily.  90 tablet  1  . terazosin (HYTRIN) 10 MG capsule Take 10 mg by mouth at bedtime.        . timolol (TIMOPTIC) 0.5 % ophthalmic solution 1 drop daily.        No current facility-administered medications on file prior to visit.    BP 150/82  Pulse 67  Temp(Src) 98.2 F (36.8 C) (Oral)  Resp 16  Ht 6' 1.5" (1.867 m)  Wt 216 lb 0.6 oz (97.995 kg)  BMI 28.11 kg/m2  SpO2 99%    Objective:   Physical Exam  Constitutional: He is  oriented to person, place, and time. He appears well-developed and well-nourished. No distress.  HENT:  Head: Normocephalic and atraumatic.  Cardiovascular: Normal rate and regular rhythm.   Murmur heard. Pulmonary/Chest: Effort normal and breath sounds normal. No respiratory distress. He has no wheezes. He has no rales. He exhibits no tenderness.  Neurological: He is alert and oriented to person, place, and time.  Psychiatric: He has a normal mood and affect. His behavior is normal. Judgment and thought content normal.          Assessment & Plan:

## 2013-10-22 NOTE — Assessment & Plan Note (Signed)
BP Readings from Last 3 Encounters:  10/22/13 150/82  09/09/13 138/72  07/23/13 116/80    BP is up today. Did not take AM meds yet- likely cause for elevation.

## 2013-10-29 ENCOUNTER — Telehealth: Payer: Self-pay | Admitting: *Deleted

## 2013-10-29 NOTE — Telephone Encounter (Signed)
Message copied by Ronny Flurry on Wed Oct 29, 2013  7:55 AM ------      Message from: O'SULLIVAN, MELISSA      Created: Wed Oct 22, 2013  8:13 AM       Please document historical flu shot 10/14 ------

## 2013-10-29 NOTE — Telephone Encounter (Signed)
Injection already documented for 09/26/13.

## 2013-11-19 ENCOUNTER — Other Ambulatory Visit: Payer: Self-pay | Admitting: *Deleted

## 2013-11-19 MED ORDER — LOSARTAN POTASSIUM-HCTZ 100-12.5 MG PO TABS: 1.0000 | ORAL_TABLET | Freq: Every day | ORAL | Status: DC

## 2013-11-19 NOTE — Telephone Encounter (Signed)
Rx was sent to pharmacy electronically. 

## 2013-11-24 ENCOUNTER — Encounter: Payer: Self-pay | Admitting: Cardiovascular Disease

## 2013-12-29 ENCOUNTER — Other Ambulatory Visit: Payer: Self-pay | Admitting: *Deleted

## 2013-12-29 MED ORDER — CARVEDILOL 25 MG PO TABS: 25.0000 mg | ORAL_TABLET | Freq: Two times a day (BID) | ORAL | Status: DC

## 2013-12-29 NOTE — Telephone Encounter (Signed)
Rx was sent to pharmacy electronically. 

## 2014-01-21 ENCOUNTER — Ambulatory Visit: Payer: Medicare PPO | Admitting: Family

## 2014-01-28 ENCOUNTER — Ambulatory Visit (INDEPENDENT_AMBULATORY_CARE_PROVIDER_SITE_OTHER): Payer: Commercial Managed Care - HMO | Admitting: Family

## 2014-01-28 ENCOUNTER — Encounter: Payer: Self-pay | Admitting: Family

## 2014-01-28 VITALS — BP 114/68 | HR 68 | Temp 97.9°F | Resp 16 | Ht 73.5 in | Wt 210.1 lb

## 2014-01-28 DIAGNOSIS — C61 Malignant neoplasm of prostate: Secondary | ICD-10-CM

## 2014-01-28 DIAGNOSIS — R7309 Other abnormal glucose: Secondary | ICD-10-CM

## 2014-01-28 DIAGNOSIS — D509 Iron deficiency anemia, unspecified: Secondary | ICD-10-CM

## 2014-01-28 DIAGNOSIS — N289 Disorder of kidney and ureter, unspecified: Secondary | ICD-10-CM

## 2014-01-28 DIAGNOSIS — I10 Essential (primary) hypertension: Secondary | ICD-10-CM | POA: Diagnosis not present

## 2014-01-28 DIAGNOSIS — R682 Dry mouth, unspecified: Secondary | ICD-10-CM

## 2014-01-28 DIAGNOSIS — I428 Other cardiomyopathies: Secondary | ICD-10-CM

## 2014-01-28 DIAGNOSIS — R739 Hyperglycemia, unspecified: Secondary | ICD-10-CM

## 2014-01-28 DIAGNOSIS — K117 Disturbances of salivary secretion: Secondary | ICD-10-CM

## 2014-01-28 DIAGNOSIS — E785 Hyperlipidemia, unspecified: Secondary | ICD-10-CM

## 2014-01-28 DIAGNOSIS — I420 Dilated cardiomyopathy: Secondary | ICD-10-CM

## 2014-01-28 LAB — CBC WITH DIFFERENTIAL/PLATELET
BASOS ABS: 0 10*3/uL (ref 0.0–0.1)
Basophils Relative: 0 % (ref 0–1)
EOS ABS: 0.2 10*3/uL (ref 0.0–0.7)
EOS PCT: 7 % — AB (ref 0–5)
HCT: 35.5 % — ABNORMAL LOW (ref 39.0–52.0)
Hemoglobin: 11.7 g/dL — ABNORMAL LOW (ref 13.0–17.0)
LYMPHS PCT: 46 % (ref 12–46)
Lymphs Abs: 1.4 10*3/uL (ref 0.7–4.0)
MCH: 29.8 pg (ref 26.0–34.0)
MCHC: 33 g/dL (ref 30.0–36.0)
MCV: 90.6 fL (ref 78.0–100.0)
Monocytes Absolute: 0.4 10*3/uL (ref 0.1–1.0)
Monocytes Relative: 13 % — ABNORMAL HIGH (ref 3–12)
Neutro Abs: 1.1 10*3/uL — ABNORMAL LOW (ref 1.7–7.7)
Neutrophils Relative %: 34 % — ABNORMAL LOW (ref 43–77)
Platelets: 186 10*3/uL (ref 150–400)
RBC: 3.92 MIL/uL — ABNORMAL LOW (ref 4.22–5.81)
RDW: 15 % (ref 11.5–15.5)
WBC: 3.1 10*3/uL — AB (ref 4.0–10.5)

## 2014-01-28 LAB — IRON: Iron: 62 ug/dL (ref 42–165)

## 2014-01-28 LAB — FERRITIN: Ferritin: 563 ng/mL — ABNORMAL HIGH (ref 22–322)

## 2014-01-28 NOTE — Assessment & Plan Note (Signed)
Improved. Still declining further work up/referral to rheumatology.

## 2014-01-28 NOTE — Patient Instructions (Signed)
Apply lotrimin cream to both feet once daily for athlete's foot.  Complete lab work prior to leaving. Follow up in 3 months.

## 2014-01-28 NOTE — Progress Notes (Signed)
Subjective:    Patient ID: David [name], male    DOB: September 27, 1941, 73 y.o.   MRN: [id number]  HPI  David [name] is a 73 yr old male who presents today for follow up of multiple medical problems.  1) DM2- Sugars are currently diet controlled. Lab Results  Component Value Date   HGBA1C 6.0* 07/23/2013   2) HTN- Maintained on losartan-hctz and coreg.  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 Follows with Dr. Janice Norrie. Saw him a few months ago. Denies difficulty with urination. On hytrin and proscar.   4) Cardiomyopathy- last echo on file was 2013- notes LVEF 45-50%.  Pt is followed by Dr. Thana Farr, cardiology. Denies LE edema, CP or SOB.   5) iron deficiency anemia-  Lab Results  Component Value Date   WBC 3.7* 03/06/2013   HGB 11.1* 03/06/2013   HCT 33.8* 03/06/2013   MCV 95 03/06/2013   PLT 177 03/06/2013   6) renal insufficiency- He is followed by [place] [facility]. Last Cr was stable in January 1.36.    7) ? Sjogren's-reports resolution of dry eyes and dry mouth. Declines referral to rheumatology.  Review of Systems See HPI  Past Medical History  Diagnosis Date  . Hypertension     followed by [facility] (Dr Dani Gobble Croitoru)  . Hyperlipidemia   . Cancer     hx of prostate; s/p radioactive seed implant 10/2009 Dr Janice Norrie  . 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 History of cardiac cath 2004    negative for CAD  . History of myocardial perfusion scan 02/2010    negative for coronary insufficiency (LVEF 27%)  . Anemia, iron deficiency 11/15/2011  . Nonischemic cardiomyopathy   . Sinus bradycardia     History   Social History  . Marital Status: Married    Spouse Name: N/A    Number of Children: 2  . Years of Education: N/A   Occupational History  . retired    Social  History Main Topics  . Smoking status: Former Smoker    Quit date: 12/04/2005  . Smokeless tobacco: Never Used  . Alcohol Use: Yes  . Drug Use: Not on file  . Sexual Activity: Not on file   Other Topics Concern  . Not on file   Social History Narrative   Stays active at home   Regular exercise: no    Past Surgical History  Procedure Laterality Date  . Radioactive seed implant  2010    prostate cancer  . Cardiac catheterization  02/04/2003    mildly depressed LV systolic fx EF [id number] coronaries/abdominal aorta/renal arteries.  . US echocardiography  02/06/2012    mild LVH,LA mod. dilated,mild-mod. 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 AI.  Marland Kitchen Nm myocar perf wall motion  02/21/2010    normal    Family History  Problem Relation Age of Onset  . Hypertension Mother   . Thyroid disease Mother   . Cholelithiasis Daughter   . Cholelithiasis Son   . Hypertension Maternal Grandmother   . Diabetes Maternal Grandmother   . Heart attack Neg Hx   . Hyperlipidemia Neg Hx     Allergies  Allergen Reactions  . Cozaar [Losartan Potassium] Other (See Comments)    constipation  Current Outpatient Prescriptions on File Prior to Visit  Medication Sig Dispense Refill  . acetaminophen (TYLENOL) 500 MG tablet Take 500 mg by mouth 2 (two) times daily.      Marland Kitchen aspirin 325 MG EC tablet Take 325 mg by mouth daily.        . carvedilol (COREG) 25 MG tablet Take 1 tablet (25 mg total) by mouth 2 (two) times daily.  180 tablet  3  . dorzolamide (TRUSOPT) 2 % ophthalmic solution 1 drop 3 (three) times daily.       . finasteride (PROSCAR) 5 MG tablet Take 5 mg by mouth daily.        Marland Kitchen latanoprost (XALATAN) 0.005 % ophthalmic solution 1 drop at bedtime.       Marland Kitchen losartan-hydrochlorothiazide (HYZAAR) 100-12.5 MG per tablet Take 1 tablet by mouth daily.  90 tablet  3  . terazosin (HYTRIN) 10 MG capsule Take 10 mg by mouth at bedtime.        . timolol (TIMOPTIC) 0.5 %  ophthalmic solution 1 drop daily.        No current facility-administered medications on file prior to visit.    BP 114/68  Pulse 68  Temp(Src) 97.9 F (36.6 C) (Oral)  Resp 16  Ht 6' 1.5" (1.867 m)  Wt 210 lb 1.3 oz (95.292 kg)  BMI 27.34 kg/m2  SpO2 100%       Objective:   Physical Exam  Constitutional: He is oriented to person, place, and time. He appears well-developed and well-nourished. No distress.  Cardiovascular: Normal rate and regular rhythm.   Murmur heard. Pulmonary/Chest: Effort normal and breath sounds normal. No respiratory distress. He has no wheezes. He has no rales. He exhibits no tenderness.  Musculoskeletal: He exhibits no edema.  Neurological: He is alert and oriented to person, place, and time.  Psychiatric: He has a normal mood and affect. His behavior is normal. Judgment and thought content normal.          Assessment & Plan:

## 2014-01-28 NOTE — Progress Notes (Signed)
Pre visit review using our clinic review tool, if applicable. No additional management support is needed unless otherwise documented below in the visit note. 

## 2014-01-28 NOTE — Assessment & Plan Note (Signed)
Lab Results  Component Value Date   CHOL 145 01/09/2013   HDL 35* 01/09/2013   LDLCALC 92 01/09/2013   TRIG 90 01/09/2013   CHOLHDL 4.1 01/09/2013   LDL at goal with diet, continue low cholesterol diet.

## 2014-01-28 NOTE — Assessment & Plan Note (Signed)
Clinically stable. Discussed continued dietary changes.  Obtain A1C, urine microalbumin. On ARB.

## 2014-01-28 NOTE — Assessment & Plan Note (Signed)
Stable, management per nephrology.  

## 2014-01-28 NOTE — Assessment & Plan Note (Signed)
Check follow up cbc, iron levels.

## 2014-01-28 NOTE — Assessment & Plan Note (Signed)
Clinically compensated.  Management per cardiology.

## 2014-01-28 NOTE — Assessment & Plan Note (Signed)
Current blood pressure is stable. Continue current meds.

## 2014-01-28 NOTE — Assessment & Plan Note (Signed)
S/p radioactive seed implant 2010, follows with Dr. Janice Norrie. Stable.

## 2014-01-29 ENCOUNTER — Encounter: Payer: Self-pay | Admitting: Family

## 2014-01-29 LAB — MICROALBUMIN / CREATININE URINE RATIO
Creatinine, Urine: 127.9 mg/dL
MICROALB UR: 51.41 mg/dL — AB (ref 0.00–1.89)
Microalb Creat Ratio: 402 mg/g — ABNORMAL HIGH (ref 0.0–30.0)

## 2014-01-29 LAB — HEMOGLOBIN A1C
HEMOGLOBIN A1C: 6.1 % — AB (ref <5.7)
MEAN PLASMA GLUCOSE: 128 mg/dL — AB (ref <117)

## 2014-01-30 ENCOUNTER — Telehealth: Payer: Self-pay | Admitting: Family

## 2014-01-30 NOTE — Telephone Encounter (Signed)
Relevant patient education mailed to patient.  

## 2014-04-29 ENCOUNTER — Other Ambulatory Visit: Payer: Self-pay | Admitting: Family

## 2014-04-29 ENCOUNTER — Ambulatory Visit (HOSPITAL_BASED_OUTPATIENT_CLINIC_OR_DEPARTMENT_OTHER)
Admission: RE | Admit: 2014-04-29 | Discharge: 2014-04-29 | Disposition: A | Discharge status: Home / Self Care | Payer: Medicare HMO | Source: Ambulatory Visit | Attending: Family | Admitting: Family

## 2014-04-29 ENCOUNTER — Encounter: Payer: Self-pay | Admitting: Family

## 2014-04-29 ENCOUNTER — Ambulatory Visit (INDEPENDENT_AMBULATORY_CARE_PROVIDER_SITE_OTHER): Payer: Medicare HMO | Admitting: Family

## 2014-04-29 VITALS — BP 136/84 | HR 58 | Temp 98.0°F | Resp 16 | Ht 73.5 in | Wt 188.0 lb

## 2014-04-29 DIAGNOSIS — R112 Nausea with vomiting, unspecified: Secondary | ICD-10-CM

## 2014-04-29 DIAGNOSIS — H109 Unspecified conjunctivitis: Secondary | ICD-10-CM | POA: Insufficient documentation

## 2014-04-29 DIAGNOSIS — R634 Abnormal weight loss: Secondary | ICD-10-CM | POA: Insufficient documentation

## 2014-04-29 DIAGNOSIS — I2510 Atherosclerotic heart disease of native coronary artery without angina pectoris: Secondary | ICD-10-CM | POA: Insufficient documentation

## 2014-04-29 DIAGNOSIS — Z8546 Personal history of malignant neoplasm of prostate: Secondary | ICD-10-CM | POA: Insufficient documentation

## 2014-04-29 DIAGNOSIS — J984 Other disorders of lung: Secondary | ICD-10-CM | POA: Insufficient documentation

## 2014-04-29 DIAGNOSIS — C61 Malignant neoplasm of prostate: Secondary | ICD-10-CM

## 2014-04-29 DIAGNOSIS — Z923 Personal history of irradiation: Secondary | ICD-10-CM | POA: Insufficient documentation

## 2014-04-29 LAB — CBC WITH DIFFERENTIAL/PLATELET
BASOS ABS: 0 10*3/uL (ref 0.0–0.1)
Basophils Relative: 1 % (ref 0–1)
Eosinophils Absolute: 0.1 10*3/uL (ref 0.0–0.7)
Eosinophils Relative: 4 % (ref 0–5)
HCT: 31.6 % — ABNORMAL LOW (ref 39.0–52.0)
Hemoglobin: 10.5 g/dL — ABNORMAL LOW (ref 13.0–17.0)
LYMPHS PCT: 30 % (ref 12–46)
Lymphs Abs: 1 10*3/uL (ref 0.7–4.0)
MCH: 29.5 pg (ref 26.0–34.0)
MCHC: 33.2 g/dL (ref 30.0–36.0)
MCV: 88.8 fL (ref 78.0–100.0)
Monocytes Absolute: 0.4 10*3/uL (ref 0.1–1.0)
Monocytes Relative: 13 % — ABNORMAL HIGH (ref 3–12)
NEUTROS PCT: 52 % (ref 43–77)
Neutro Abs: 1.7 10*3/uL (ref 1.7–7.7)
PLATELETS: 166 10*3/uL (ref 150–400)
RBC: 3.56 MIL/uL — AB (ref 4.22–5.81)
RDW: 14.6 % (ref 11.5–15.5)
WBC: 3.3 10*3/uL — AB (ref 4.0–10.5)

## 2014-04-29 MED ORDER — NEOMYCIN-POLYMYXIN-HC OP SUSP: 2.0000 [drp] | Freq: Two times a day (BID) | OPHTHALMIC | Status: DC

## 2014-04-29 MED ORDER — ONDANSETRON 4 MG PO TBDP: 4.0000 mg | ORAL_TABLET | Freq: Three times a day (TID) | ORAL | Status: DC | PRN

## 2014-04-29 NOTE — Progress Notes (Signed)
Pre visit review using our clinic review tool, if applicable. No additional management support is needed unless otherwise documented below in the visit note. 

## 2014-04-29 NOTE — Assessment & Plan Note (Signed)
Pt with weight loss, nausea, vomiting. Obtain LFT, lipase, CT chest/abd pelvis.  Due to hx of prostate cancer, will obtain PSA.  Check urine culture.  Refer to GI for further evaluation of dysphagia.  Complete ct scan of chest abdomen and pelvis.  Drink 2 cans of ensure a day + 6 small meals. Make sure to get enough water. Go to the ER if you are unable to keep down food/water. You may use zofran as needed for nausea. Please follow up in 1 week.

## 2014-04-29 NOTE — Assessment & Plan Note (Signed)
Initiate cortisporin eye drops.  Needs formal eye examination. Pt instructed:  Please schedule eye exam with your eye doctor this week, let me know if you have any trouble getting an appointment.

## 2014-04-29 NOTE — Patient Instructions (Signed)
Please complete lab work prior to leaving. Complete ct scan on the first floor.  Please schedule eye exam with your eye doctor this week, let me know if you have any trouble getting an appointment.  Start eye drops in your right eye. Drink 2 cans of ensure a day + 6 small meals. Make sure to get enough water. Go to the ER if you are unable to keep down food/water. You may use zofran as needed for nausea. You will be contacted about your referral to GI. Please follow up in 1 week.

## 2014-04-29 NOTE — Progress Notes (Signed)
Subjective:    Patient ID: David [name], male    DOB: 23-Oct-1941, 73 y.o.   MRN: [id number]  HPI  David [name] is a 73 yr old male who presents today with chief complaint of nausea, vomiting and weight loss. He has lost 22 pounds since his last visit in February of this year. Reports that he has had vomiting x 2 weeks.  Reports that he sometimes has some associated dysphagia. Denies abdominal pain.   Prior to 2 weeks ago- he reports ago he reports tolerating PO's but notes that food just didn't taste good.  Everything taste's "sweet and funny." Reports that his BM's are normal, denies diarrhea, black or bloody stools.  Notes generalized weakness and intermittent chills.   Wt Readings from Last 3 Encounters:  04/29/14 188 lb (85.276 kg)  01/28/14 210 lb 1.3 oz (95.292 kg)  10/22/13 216 lb 0.6 oz (97.995 kg)   Notes that he got "trash in my eye" several days ago and then "rubbed it."  Eye has been irritated and feels "like there is something in it."  Sees Dr. Johney Maine- eye doctor.   Review of Systems See HPI  Past Medical History  Diagnosis Date  . Hypertension     followed by [facility] (Dr Dani Gobble Croitoru)  . Hyperlipidemia   . Cancer     hx of prostate; s/p radioactive seed implant 10/2009 Dr Janice Norrie  . 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 History of cardiac cath 2004    negative for CAD  . History of myocardial perfusion scan 02/2010    negative for coronary insufficiency (LVEF 27%)  . Anemia, iron deficiency 11/15/2011  . Nonischemic cardiomyopathy   . Sinus bradycardia     History   Social History  . Marital Status: Married    Spouse Name: N/A    Number of Children: 2  . Years of Education: N/A   Occupational History  . retired    Social History Main Topics  . Smoking status: Former Smoker    Quit date: 12/04/2005  . Smokeless tobacco: Never Used  . Alcohol Use: Yes  . Drug Use: Not on  file  . Sexual Activity: Not on file   Other Topics Concern  . Not on file   Social History Narrative   Stays active at home   Regular exercise: no    Past Surgical History  Procedure Laterality Date  . Radioactive seed implant  2010    prostate cancer  . Cardiac catheterization  02/04/2003    mildly depressed LV systolic fx EF [id number] coronaries/abdominal aorta/renal arteries.  . US echocardiography  02/06/2012    mild LVH,LA mod. dilated,mild-mod. 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 AI.  Marland Kitchen Nm myocar perf wall motion  02/21/2010    normal    Family History  Problem Relation Age of Onset  . Hypertension Mother   . Thyroid disease Mother   . Cholelithiasis Daughter   . Cholelithiasis Son   . Hypertension Maternal Grandmother   . Diabetes Maternal Grandmother   . Heart attack Neg Hx   . Hyperlipidemia Neg Hx     Allergies  Allergen Reactions  . Cozaar [Losartan Potassium] Other (See Comments)    constipation    Current Outpatient Prescriptions on File Prior to Visit  Medication Sig Dispense Refill  . acetaminophen (TYLENOL) 500 MG tablet Take 500 mg by mouth  2 (two) times daily.      Marland Kitchen aspirin 325 MG EC tablet Take 325 mg by mouth daily.        . carvedilol (COREG) 25 MG tablet Take 1 tablet (25 mg total) by mouth 2 (two) times daily.  180 tablet  3  . dorzolamide (TRUSOPT) 2 % ophthalmic solution 1 drop 3 (three) times daily.       . finasteride (PROSCAR) 5 MG tablet Take 5 mg by mouth daily.        Marland Kitchen latanoprost (XALATAN) 0.005 % ophthalmic solution 1 drop at bedtime.       Marland Kitchen losartan-hydrochlorothiazide (HYZAAR) 100-12.5 MG per tablet Take 1 tablet by mouth daily.  90 tablet  3  . terazosin (HYTRIN) 10 MG capsule Take 10 mg by mouth at bedtime.        . timolol (TIMOPTIC) 0.5 % ophthalmic solution 1 drop daily.        No current facility-administered medications on file prior to visit.    BP 136/84  Pulse 58  Temp(Src) 98 F (36.7  C) (Oral)  Resp 16  Ht 6' 1.5" (1.867 m)  Wt 188 lb (85.276 kg)  BMI 24.46 kg/m2  SpO2 97%       Objective:   Physical Exam  Constitutional: He is oriented to person, place, and time.  Thin weak appearing AA male seated on table in NAD  Eyes:  R eye noted to have a small amount of yellow discharge right inner canthus. Sclera are injected  Cardiovascular: Normal rate and regular rhythm.   No murmur heard. Pulmonary/Chest: Effort normal and breath sounds normal. No respiratory distress. He has no wheezes. He has no rales. He exhibits no tenderness.  Abdominal: Soft. Bowel sounds are normal. He exhibits no distension and no mass. There is no tenderness. There is no rebound and no guarding.  Musculoskeletal: He exhibits no edema.  Neurological: He is alert and oriented to person, place, and time.  Skin: Skin is warm and dry.  Psychiatric: He has a normal mood and affect. His behavior is normal. Judgment and thought content normal.          Assessment & Plan:

## 2014-04-30 LAB — BASIC METABOLIC PANEL WITH GFR
BUN: 68 mg/dL — AB (ref 6–23)
CALCIUM: 8.6 mg/dL (ref 8.4–10.5)
CO2: 23 meq/L (ref 19–32)
Chloride: 108 mEq/L (ref 96–112)
Creat: 4.9 mg/dL — ABNORMAL HIGH (ref 0.50–1.35)
GFR, EST AFRICAN AMERICAN: 13 mL/min — AB
GFR, Est Non African American: 11 mL/min — ABNORMAL LOW
Glucose, Bld: 90 mg/dL (ref 70–99)
Potassium: 4.7 mEq/L (ref 3.5–5.3)
SODIUM: 138 meq/L (ref 135–145)

## 2014-04-30 LAB — HEPATIC FUNCTION PANEL
ALBUMIN: 3.4 g/dL — AB (ref 3.5–5.2)
ALT: 20 U/L (ref 0–53)
AST: 18 U/L (ref 0–37)
Alkaline Phosphatase: 77 U/L (ref 39–117)
Bilirubin, Direct: 0.1 mg/dL (ref 0.0–0.3)
Indirect Bilirubin: 0.3 mg/dL (ref 0.2–1.2)
TOTAL PROTEIN: 6.8 g/dL (ref 6.0–8.3)
Total Bilirubin: 0.4 mg/dL (ref 0.2–1.2)

## 2014-04-30 LAB — TSH: TSH: 1.223 u[IU]/mL (ref 0.350–4.500)

## 2014-04-30 LAB — URINE CULTURE
Colony Count: NO GROWTH
ORGANISM ID, BACTERIA: NO GROWTH

## 2014-04-30 LAB — LIPASE: LIPASE: 92 U/L — AB (ref 0–75)

## 2014-05-01 ENCOUNTER — Telehealth: Payer: Self-pay | Admitting: Family

## 2014-05-01 DIAGNOSIS — H5989 Other postprocedural complications and disorders of eye and adnexa, not elsewhere classified: Secondary | ICD-10-CM | POA: Insufficient documentation

## 2014-05-01 DIAGNOSIS — H401190 Primary open-angle glaucoma, unspecified eye, stage unspecified: Secondary | ICD-10-CM | POA: Insufficient documentation

## 2014-05-01 MED ORDER — AMLODIPINE BESYLATE 5 MG PO TABS: 5.0000 mg | ORAL_TABLET | Freq: Every day | ORAL | Status: DC

## 2014-05-01 NOTE — Telephone Encounter (Signed)
Spoke with Dr Serita Grit assistant, Alinda Sierras. She states Dr Posey Pronto is in vascular lab all next week but they will try to pt to see PA next Tuesday. She states will arrange appt and contact pt.

## 2014-05-01 NOTE — Telephone Encounter (Signed)
Called pt again. Spoke with pt.  Advised him that he is in acute renal failure and advised him to proceed to the ED.  He reports that he has been eating, drinking and voiding today without difficulty. Has not had nausea in 24 hours. Denies hx of NSAID use.  Refuses referral to ED.  Advised pt that it is still my recommendation that he proceed to ED as I am concerned that his kidney function could worsen over the weekend and potentially progress to need for hemodialysis.  Pt reports "if anything changes I will go."  In the meantime, I have advised pt to stop losartan hctz and instead start amlodipine for BP control. Continue Coreg.   Pt has apt with [place] on Tuesday of next week.

## 2014-05-01 NOTE — Telephone Encounter (Signed)
Pt has recent worsening of his kidney function.  Could we please try to get him in next week to see his nephrologist?

## 2014-05-01 NOTE — Telephone Encounter (Addendum)
Left detailed message on pt's cell and wife's cell requesting that patient return our call this afternoon.    When pt calls back please let him know that his lab work shows severe dehydration and possible pancreatitis.  I would advise him to proceed to the emergency room for IV fluids and further evaluation.

## 2014-05-02 ENCOUNTER — Inpatient Hospital Stay (HOSPITAL_COMMUNITY)
Admission: EM | Admit: 2014-05-02 | Discharge: 2014-05-15 | DRG: 977 | Disposition: A | Discharge status: Home / Self Care | Payer: Medicare HMO | Attending: Internal Medicine | Admitting: Internal Medicine

## 2014-05-02 ENCOUNTER — Encounter (HOSPITAL_COMMUNITY): Payer: Self-pay | Admitting: Emergency Medicine

## 2014-05-02 DIAGNOSIS — C61 Malignant neoplasm of prostate: Secondary | ICD-10-CM | POA: Diagnosis present

## 2014-05-02 DIAGNOSIS — I519 Heart disease, unspecified: Secondary | ICD-10-CM

## 2014-05-02 DIAGNOSIS — Z79899 Other long term (current) drug therapy: Secondary | ICD-10-CM

## 2014-05-02 DIAGNOSIS — N186 End stage renal disease: Secondary | ICD-10-CM | POA: Diagnosis present

## 2014-05-02 DIAGNOSIS — N179 Acute kidney failure, unspecified: Secondary | ICD-10-CM

## 2014-05-02 DIAGNOSIS — R634 Abnormal weight loss: Secondary | ICD-10-CM

## 2014-05-02 DIAGNOSIS — E875 Hyperkalemia: Secondary | ICD-10-CM | POA: Diagnosis present

## 2014-05-02 DIAGNOSIS — A539 Syphilis, unspecified: Secondary | ICD-10-CM

## 2014-05-02 DIAGNOSIS — R1310 Dysphagia, unspecified: Secondary | ICD-10-CM | POA: Diagnosis present

## 2014-05-02 DIAGNOSIS — B20 Human immunodeficiency virus [HIV] disease: Secondary | ICD-10-CM

## 2014-05-02 DIAGNOSIS — M545 Low back pain, unspecified: Secondary | ICD-10-CM

## 2014-05-02 DIAGNOSIS — Z992 Dependence on renal dialysis: Secondary | ICD-10-CM

## 2014-05-02 DIAGNOSIS — IMO0002 Reserved for concepts with insufficient information to code with codable children: Secondary | ICD-10-CM

## 2014-05-02 DIAGNOSIS — I959 Hypotension, unspecified: Secondary | ICD-10-CM

## 2014-05-02 DIAGNOSIS — N184 Chronic kidney disease, stage 4 (severe): Secondary | ICD-10-CM

## 2014-05-02 DIAGNOSIS — E43 Unspecified severe protein-calorie malnutrition: Secondary | ICD-10-CM

## 2014-05-02 DIAGNOSIS — N189 Chronic kidney disease, unspecified: Secondary | ICD-10-CM

## 2014-05-02 DIAGNOSIS — E872 Acidosis, unspecified: Secondary | ICD-10-CM | POA: Diagnosis present

## 2014-05-02 DIAGNOSIS — N19 Unspecified kidney failure: Secondary | ICD-10-CM

## 2014-05-02 DIAGNOSIS — I953 Hypotension of hemodialysis: Secondary | ICD-10-CM

## 2014-05-02 DIAGNOSIS — D509 Iron deficiency anemia, unspecified: Secondary | ICD-10-CM

## 2014-05-02 DIAGNOSIS — I10 Essential (primary) hypertension: Secondary | ICD-10-CM

## 2014-05-02 DIAGNOSIS — Z8249 Family history of ischemic heart disease and other diseases of the circulatory system: Secondary | ICD-10-CM

## 2014-05-02 DIAGNOSIS — I509 Heart failure, unspecified: Secondary | ICD-10-CM | POA: Diagnosis present

## 2014-05-02 DIAGNOSIS — E785 Hyperlipidemia, unspecified: Secondary | ICD-10-CM | POA: Diagnosis present

## 2014-05-02 DIAGNOSIS — Z87891 Personal history of nicotine dependence: Secondary | ICD-10-CM

## 2014-05-02 DIAGNOSIS — K3184 Gastroparesis: Secondary | ICD-10-CM

## 2014-05-02 DIAGNOSIS — D61818 Other pancytopenia: Secondary | ICD-10-CM

## 2014-05-02 DIAGNOSIS — E860 Dehydration: Secondary | ICD-10-CM | POA: Diagnosis present

## 2014-05-02 DIAGNOSIS — I130 Hypertensive heart and chronic kidney disease with heart failure and stage 1 through stage 4 chronic kidney disease, or unspecified chronic kidney disease: Secondary | ICD-10-CM | POA: Diagnosis present

## 2014-05-02 DIAGNOSIS — A529 Late syphilis, unspecified: Secondary | ICD-10-CM

## 2014-05-02 LAB — COMPREHENSIVE METABOLIC PANEL
ALBUMIN: 3 g/dL — AB (ref 3.5–5.2)
ALK PHOS: 81 U/L (ref 39–117)
ALT: 21 U/L (ref 0–53)
AST: 18 U/L (ref 0–37)
BILIRUBIN TOTAL: 0.3 mg/dL (ref 0.3–1.2)
BUN: 73 mg/dL — AB (ref 6–23)
CHLORIDE: 103 meq/L (ref 96–112)
CO2: 21 meq/L (ref 19–32)
Calcium: 8.5 mg/dL (ref 8.4–10.5)
Creatinine, Ser: 5.85 mg/dL — ABNORMAL HIGH (ref 0.50–1.35)
GFR calc Af Amer: 10 mL/min — ABNORMAL LOW (ref >90)
GFR, EST NON AFRICAN AMERICAN: 9 mL/min — AB (ref >90)
Glucose, Bld: 111 mg/dL — ABNORMAL HIGH (ref 70–99)
POTASSIUM: 4.7 meq/L (ref 3.7–5.3)
Sodium: 138 mEq/L (ref 137–147)
Total Protein: 6.9 g/dL (ref 6.0–8.3)

## 2014-05-02 LAB — CBC WITH DIFFERENTIAL/PLATELET
BASOS PCT: 0 % (ref 0–1)
Basophils Absolute: 0 10*3/uL (ref 0.0–0.1)
Eosinophils Absolute: 0.2 10*3/uL (ref 0.0–0.7)
Eosinophils Relative: 5 % (ref 0–5)
HCT: 31.1 % — ABNORMAL LOW (ref 39.0–52.0)
Hemoglobin: 10.3 g/dL — ABNORMAL LOW (ref 13.0–17.0)
LYMPHS PCT: 33 % (ref 12–46)
Lymphs Abs: 1 10*3/uL (ref 0.7–4.0)
MCH: 29.9 pg (ref 26.0–34.0)
MCHC: 33.1 g/dL (ref 30.0–36.0)
MCV: 90.1 fL (ref 78.0–100.0)
Monocytes Absolute: 0.3 10*3/uL (ref 0.1–1.0)
Monocytes Relative: 10 % (ref 3–12)
NEUTROS PCT: 52 % (ref 43–77)
Neutro Abs: 1.5 10*3/uL — ABNORMAL LOW (ref 1.7–7.7)
Platelets: 129 10*3/uL — ABNORMAL LOW (ref 150–400)
RBC: 3.45 MIL/uL — AB (ref 4.22–5.81)
RDW: 13.1 % (ref 11.5–15.5)
WBC: 3 10*3/uL — ABNORMAL LOW (ref 4.0–10.5)

## 2014-05-02 LAB — URINALYSIS, ROUTINE W REFLEX MICROSCOPIC
Bilirubin Urine: NEGATIVE
Glucose, UA: NEGATIVE mg/dL
Ketones, ur: NEGATIVE mg/dL
LEUKOCYTES UA: NEGATIVE
NITRITE: NEGATIVE
PROTEIN: 100 mg/dL — AB
SPECIFIC GRAVITY, URINE: 1.012 (ref 1.005–1.030)
UROBILINOGEN UA: 0.2 mg/dL (ref 0.0–1.0)
pH: 5 (ref 5.0–8.0)

## 2014-05-02 LAB — URINE MICROSCOPIC-ADD ON

## 2014-05-02 LAB — LIPASE, BLOOD: LIPASE: 113 U/L — AB (ref 11–59)

## 2014-05-02 MED ORDER — NEOMYCIN-POLYMYXIN-DEXAMETH 3.5-10000-0.1 OP SUSP
2.0000 [drp] | Freq: Two times a day (BID) | OPHTHALMIC | Status: DC
  Administered 2014-05-03 – 2014-05-15 (×9): 2 [drp] via OPHTHALMIC
  Filled 2014-05-02: qty 5

## 2014-05-02 MED ORDER — ACETAMINOPHEN 500 MG PO TABS
500.0000 mg | ORAL_TABLET | Freq: Two times a day (BID) | ORAL | Status: DC
  Administered 2014-05-02 – 2014-05-15 (×7): 500 mg via ORAL
  Filled 2014-05-02 (×27): qty 1

## 2014-05-02 MED ORDER — ONDANSETRON HCL 4 MG/2ML IJ SOLN
4.0000 mg | Freq: Four times a day (QID) | INTRAMUSCULAR | Status: DC | PRN
  Administered 2014-05-02 – 2014-05-11 (×6): 4 mg via INTRAVENOUS
  Filled 2014-05-02 (×6): qty 2

## 2014-05-02 MED ORDER — SODIUM CHLORIDE 0.9 % IV SOLN
1000.0000 mL | Freq: Once | INTRAVENOUS | Status: AC
  Administered 2014-05-02: 1000 mL via INTRAVENOUS

## 2014-05-02 MED ORDER — NEOMYCIN-POLYMYXIN-HC OP SUSP: 2.0000 [drp] | Freq: Two times a day (BID) | OPHTHALMIC | Status: DC

## 2014-05-02 MED ORDER — HEPARIN SODIUM (PORCINE) 5000 UNIT/ML IJ SOLN
5000.0000 [IU] | Freq: Three times a day (TID) | INTRAMUSCULAR | Status: DC
  Administered 2014-05-02 – 2014-05-15 (×39): 5000 [IU] via SUBCUTANEOUS
  Filled 2014-05-02 (×41): qty 1

## 2014-05-02 MED ORDER — SODIUM CHLORIDE 0.9 % IJ SOLN
3.0000 mL | Freq: Two times a day (BID) | INTRAMUSCULAR | Status: DC
  Administered 2014-05-03 – 2014-05-12 (×6): 3 mL via INTRAVENOUS

## 2014-05-02 MED ORDER — LATANOPROST 0.005 % OP SOLN
1.0000 [drp] | Freq: Every day | OPHTHALMIC | Status: DC
Start: 2014-05-02 — End: 2014-05-15
  Administered 2014-05-03 – 2014-05-08 (×4): 1 [drp] via OPHTHALMIC
  Filled 2014-05-02: qty 2.5

## 2014-05-02 MED ORDER — SODIUM CHLORIDE 0.9 % IV SOLN
1000.0000 mL | INTRAVENOUS | Status: DC
  Administered 2014-05-02 – 2014-05-03 (×2): 1000 mL via INTRAVENOUS

## 2014-05-02 MED ORDER — SODIUM CHLORIDE 0.9 % IV BOLUS (SEPSIS): 1000.0000 mL | Freq: Once | INTRAVENOUS | Status: DC

## 2014-05-02 MED ORDER — TIMOLOL MALEATE 0.5 % OP SOLN
1.0000 [drp] | Freq: Every day | OPHTHALMIC | Status: DC
  Administered 2014-05-02 – 2014-05-03 (×2): 1 [drp] via OPHTHALMIC
  Filled 2014-05-02: qty 5

## 2014-05-02 MED ORDER — CHLORHEXIDINE GLUCONATE 0.12 % MT SOLN
15.0000 mL | Freq: Two times a day (BID) | OROMUCOSAL | Status: DC
  Administered 2014-05-02 – 2014-05-08 (×12): 15 mL via OROMUCOSAL
  Filled 2014-05-02 (×16): qty 15

## 2014-05-02 MED ORDER — DORZOLAMIDE HCL 2 % OP SOLN
1.0000 [drp] | Freq: Three times a day (TID) | OPHTHALMIC | Status: DC
  Administered 2014-05-02 – 2014-05-14 (×14): 1 [drp] via OPHTHALMIC
  Filled 2014-05-02: qty 10

## 2014-05-02 MED ORDER — ONDANSETRON HCL 4 MG PO TABS: 4.0000 mg | ORAL_TABLET | Freq: Four times a day (QID) | ORAL | Status: DC | PRN

## 2014-05-02 MED ORDER — BIOTENE DRY MOUTH MT LIQD
15.0000 mL | Freq: Two times a day (BID) | OROMUCOSAL | Status: DC
  Administered 2014-05-02 – 2014-05-08 (×11): 15 mL via OROMUCOSAL

## 2014-05-02 NOTE — ED Notes (Signed)
Patient is trying to urinate 

## 2014-05-02 NOTE — Telephone Encounter (Signed)
Thank you. Please call pt and see if we can get him back in to see [place] on Monday AM.

## 2014-05-02 NOTE — ED Provider Notes (Signed)
Spoke with radiology who can perform barium swallow tomorrow morning since not emergent, but will expedite his hospital workup.   Hoy Morn, MD 05/02/14 [phone]

## 2014-05-02 NOTE — ED Notes (Signed)
Patient is aware that a urine sample is needed, urinal is at bedside.

## 2014-05-02 NOTE — ED Notes (Addendum)
Per patient- has had nausea and vomiting for 2 weeks. Denies blood in vomit and denies diarrhea. Saw PCP recently and was referred here due to ARF. Has had decreased fluid and food intake x2 weeks. Reports normal urination still. 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 Was given medication to treat eye with. Reports eye drainage has stopped. Has upcoming appt for this. Also c/o feeling like his food doesn't pass and "gets stuck in his chest." PCP mentioned an esophageal stricture. Ambulatory. A&O x4. No other complaints at this time.

## 2014-05-02 NOTE — ED Notes (Signed)
Pt seen by PCP yesterday and was called and told to come to ED, see note below copied from pt's chart regarding PCPs recommendations. Pt sts he vomited once today and has intermittent nausea so he decided to come to ED today. Denies abd pain.  "Called pt again. Spoke with pt. Advised him that he is in acute renal failure and advised him to proceed to the ED. He reports that he has been eating, drinking and voiding today without difficulty. Has not had nausea in 24 hours. Denies hx of NSAID use. Refuses referral to ED. Advised pt that it is still my recommendation that he proceed to ED as I am concerned that his kidney function could worsen over the weekend and potentially progress to need for hemodialysis. Pt reports "if anything changes I will go." In the meantime, I have advised pt to stop losartan hctz and instead start amlodipine for BP control. Continue Coreg. Pt has apt with [place] on Tuesday of next week. "  -Dr. [name]

## 2014-05-02 NOTE — ED Provider Notes (Addendum)
CSN: [id number]     Arrival date & time 05/02/14  1308 History   First MD Initiated Contact with Patient 05/02/14 1339     Chief Complaint  Patient presents with  . Acute Renal Failure  . Emesis  . Nausea      HPI Patient reports nausea vomiting over the past 2 weeks with an approximate 25 pound weight loss.  his primary care physician orders a CT scan of his chest abdomen and pelvis which demonstrated no obvious pathology.  He was seen in the clinic 3 days ago and when his labs returned he was noted to be in acute renal failure.  He called by his primary care physician who recommended he come in the ER but he did not want to come the ER.  That point he reports that his nausea vomiting and resolved.  He now presents the emergency department stating that his nausea vomiting is now returning.  He states every time he tries to swallow something it feels like it gets stuck somewhere near his lower chest.  He then vomits it back up.  He can keep fluids down.  He denies fevers and chills.  No chest pain shortness breath.  Denies abdominal pain.  No back pain or flank pain.  No urinary complaints.  He has a history of prostate cancer status post radioactive implants in 2010   Past Medical History  Diagnosis Date  . Hypertension     followed by [facility] and [facility] (Dr Dani Gobble Croitoru)  . Hyperlipidemia   . Cancer     hx of prostate; s/p radioactive seed implant 10/2009 Dr Janice Norrie  . 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 History of cardiac cath 2004    negative for CAD  . History of myocardial perfusion scan 02/2010    negative for coronary insufficiency (LVEF 27%)  . Anemia, iron deficiency 11/15/2011  . Nonischemic cardiomyopathy   . Sinus bradycardia    Past Surgical History  Procedure Laterality Date  . Radioactive seed implant  2010    prostate cancer  . Cardiac catheterization  02/04/2003    mildly depressed LV  systolic fx EF [id number] coronaries/abdominal aorta/renal arteries.  . US echocardiography  02/06/2012    mild LVH,LA mod. dilated,mild-mod. 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 AI.  Marland Kitchen Nm myocar perf wall motion  02/21/2010    normal   Family History  Problem Relation Age of Onset  . Hypertension Mother   . Thyroid disease Mother   . Cholelithiasis Daughter   . Cholelithiasis Son   . Hypertension Maternal Grandmother   . Diabetes Maternal Grandmother   . Heart attack Neg Hx   . Hyperlipidemia Neg Hx    History  Substance Use Topics  . Smoking status: Former Smoker    Quit date: 12/04/2005  . Smokeless tobacco: Never Used  . Alcohol Use: Yes     Comment: once a week-wine    Review of Systems  All other systems reviewed and are negative.     Allergies  Cozaar  Home Medications   Prior to Admission medications   Medication Sig Start Date End Date Taking? Authorizing Provider  acetaminophen (TYLENOL) 500 MG tablet Take 500 mg by mouth 2 (two) times daily.   Yes Historical Provider, MD  aspirin 325 MG EC tablet Take 325 mg by mouth daily.     Yes Historical Provider, MD  carvedilol (COREG) 25 MG tablet Take 1 tablet (25 mg total) by mouth 2 (two) times daily. 12/29/13  Yes Mihai Croitoru, MD  dorzolamide (TRUSOPT) 2 % ophthalmic solution 1 drop 3 (three) times daily.  05/16/12  Yes Historical Provider, MD  finasteride (PROSCAR) 5 MG tablet Take 5 mg by mouth daily.    Yes Historical Provider, MD  latanoprost (XALATAN) 0.005 % ophthalmic solution Place 1 drop into both eyes at bedtime.  05/16/12  Yes Historical Provider, MD  NEOMYCIN-POLYMYXIN-HC, OPHTH, SUSP Apply 2 drops to eye 2 (two) times daily. 04/29/14  Yes [name], NP  ondansetron (ZOFRAN ODT) 4 MG disintegrating tablet Take 1 tablet (4 mg total) by mouth every 8 (eight) hours as needed for nausea or vomiting. 04/29/14  Yes [name], NP  terazosin (HYTRIN) 10 MG capsule Take  10 mg by mouth at bedtime.    Yes Historical Provider, MD  timolol (TIMOPTIC) 0.5 % ophthalmic solution Place 1 drop into the left eye daily.  05/16/12  Yes Historical Provider, MD  amLODipine (NORVASC) 5 MG tablet Take 1 tablet (5 mg total) by mouth daily. 05/01/14   [name], NP  losartan-hydrochlorothiazide (HYZAAR) 100-12.5 MG per tablet Take 1 tablet by mouth daily.    Historical Provider, MD   BP 92/59  Pulse 65  Temp(Src) 98 F (36.7 C) (Oral)  Resp 14  SpO2 100% Physical Exam  Nursing note and vitals reviewed. Constitutional: He is oriented to person, place, and time. He appears well-developed and well-nourished.  HENT:  Head: Normocephalic and atraumatic.  Eyes: EOM are normal.  Neck: Normal range of motion.  Cardiovascular: Normal rate, regular rhythm, normal heart sounds and intact distal pulses.   Pulmonary/Chest: Effort normal and breath sounds normal. No respiratory distress.  Abdominal: Soft. He exhibits no distension. There is no tenderness.  Musculoskeletal: Normal range of motion.  Neurological: He is alert and oriented to person, place, and time.  Skin: Skin is warm and dry.  Psychiatric: He has a normal mood and affect. Judgment normal.    ED Course  Procedures (including critical care time) Labs Review Labs Reviewed  CBC WITH DIFFERENTIAL - Abnormal; Notable for the following:    WBC 3.0 (*)    RBC 3.45 (*)    Hemoglobin 10.3 (*)    HCT 31.1 (*)    Platelets 129 (*)    Neutro Abs 1.5 (*)    All other components within normal limits  COMPREHENSIVE METABOLIC PANEL - Abnormal; Notable for the following:    Glucose, Bld 111 (*)    BUN 73 (*)    Creatinine, Ser 5.85 (*)    Albumin 3.0 (*)    GFR calc non Af Amer 9 (*)    GFR calc Af Amer 10 (*)    All other components within normal limits  LIPASE, BLOOD - Abnormal; Notable for the following:    Lipase 113 (*)    All other components within normal limits  URINALYSIS, ROUTINE W REFLEX  MICROSCOPIC    Imaging Review No results found.   EKG Interpretation None      MDM   Final diagnoses:  Renal failure   the patient will be hydrated in the emergency department and laboratory studies will be repeated.  If He is in renal failure he'll likely need admission for ongoing IV hydration.  Given his recent imaging demonstrated no pathology the next thing he would likely need to be an EGD versus an esophagram    Lennette Bihari  Rocco Serene, MD 05/02/14 [place], MD 05/02/14 1537

## 2014-05-02 NOTE — Progress Notes (Signed)
Pt admitted to 1407, no fever but having chills.  Warm blankets wrapped around pt.  See assessment as charted.

## 2014-05-02 NOTE — H&P (Addendum)
[facility] History and Physical  David [name] [id number] DOB: 11/23/1941 DOA: 05/02/2014  Referring physician: er PCP: [name], NP   Chief Complaint: weight loss, N/V  HPI: David [name] is a 73 y.o. male  Who was in the process of being worked up by his PCP for nausea, vomiting and weight loss. He has lost 22 pounds since his February of this year. Reports that he has had vomiting and nausea on and off and feels as if food gets stuck.Marland Kitchen  PCP did a chest/abd/pelvis looking for malignancy since he has a h/o prostate CA.  Reports that food gets stuck in his esophagus and he vomits it up.  He does better with liquids. Occasionally has pain with it.  Denies abdominal pain.  Few days ago, patient was found to have worsening renal failure but declined ER visit.  He presents today with worsening renal failure (baseline 2- follows with Posey Pronto)    Review of Systems:  All systems reviewed, negative unless stated above    Past Medical History  Diagnosis Date  . Hypertension     followed by [facility] and Vascular (Dr Dani Gobble Croitoru)  . Hyperlipidemia   . Cancer     hx of prostate; s/p radioactive seed implant 10/2009 Dr Janice Norrie  . 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 History of cardiac cath 2004    negative for CAD  . History of myocardial perfusion scan 02/2010    negative for coronary insufficiency (LVEF 27%)  . Anemia, iron deficiency 11/15/2011  . Nonischemic cardiomyopathy   . Sinus bradycardia    Past Surgical History  Procedure Laterality Date  . Radioactive seed implant  2010    prostate cancer  . Cardiac catheterization  02/04/2003    mildly depressed LV systolic fx EF [id number] coronaries/abdominal aorta/renal arteries.  . US echocardiography  02/06/2012    mild LVH,LA mod. dilated,mild-mod. 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 AI.  Marland Kitchen Nm myocar perf wall  motion  02/21/2010    normal   Social History:  reports that he quit smoking about 8 years ago. He has never used smokeless tobacco. He reports that he drinks alcohol. He reports that he does not use illicit drugs.  Allergies  Allergen Reactions  . Cozaar [Losartan Potassium] Other (See Comments)    constipation    Family History  Problem Relation Age of Onset  . Hypertension Mother   . Thyroid disease Mother   . Cholelithiasis Daughter   . Cholelithiasis Son   . Hypertension Maternal Grandmother   . Diabetes Maternal Grandmother   . Heart attack Neg Hx   . Hyperlipidemia Neg Hx      Prior to Admission medications   Medication Sig Start Date End Date Taking? Authorizing Provider  acetaminophen (TYLENOL) 500 MG tablet Take 500 mg by mouth 2 (two) times daily.   Yes Historical Provider, MD  aspirin 325 MG EC tablet Take 325 mg by mouth daily.     Yes Historical Provider, MD  carvedilol (COREG) 25 MG tablet Take 1 tablet (25 mg total) by mouth 2 (two) times daily. 12/29/13  Yes Mihai Croitoru, MD  dorzolamide (TRUSOPT) 2 % ophthalmic solution 1 drop 3 (three) times daily.  05/16/12  Yes Historical Provider, MD  finasteride (PROSCAR) 5 MG tablet Take 5 mg by mouth daily.    Yes Historical Provider, MD  latanoprost (XALATAN) 0.005 % ophthalmic  solution Place 1 drop into both eyes at bedtime.  05/16/12  Yes Historical Provider, MD  NEOMYCIN-POLYMYXIN-HC, OPHTH, SUSP Apply 2 drops to eye 2 (two) times daily. 04/29/14  Yes [name], NP  ondansetron (ZOFRAN ODT) 4 MG disintegrating tablet Take 1 tablet (4 mg total) by mouth every 8 (eight) hours as needed for nausea or vomiting. 04/29/14  Yes [name], NP  terazosin (HYTRIN) 10 MG capsule Take 10 mg by mouth at bedtime.    Yes Historical Provider, MD  timolol (TIMOPTIC) 0.5 % ophthalmic solution Place 1 drop into the left eye daily.  05/16/12  Yes Historical Provider, MD  amLODipine (NORVASC) 5 MG tablet Take 1 tablet (5 mg  total) by mouth daily. 05/01/14   [name], NP  losartan-hydrochlorothiazide (HYZAAR) 100-12.5 MG per tablet Take 1 tablet by mouth daily.    Historical Provider, MD   Physical Exam: Filed Vitals:   05/02/14 1337  BP: 92/59  Pulse: 65  Temp: 98 F (36.7 C)  Resp: 14    BP 92/59  Pulse 65  Temp(Src) 98 F (36.7 C) (Oral)  Resp 14  SpO2 100%  General:  Appears calm and comfortable Eyes: PERRL, normal lids, irises & conjunctiva ENT: grossly normal hearing, lips & tongue Neck: no LAD, masses or thyromegaly Cardiovascular: RRR, no m/r/g. No LE edema. Telemetry: SR, no arrhythmias  Respiratory: CTA bilaterally, no w/r/r. Normal respiratory effort. Abdomen: soft, +BS, minimal tederness in epigastric region Skin: no rash or induration seen on limited exam Musculoskeletal: grossly normal tone BUE/BLE Psychiatric: grossly normal mood and affect, speech fluent and appropriate Neurologic: grossly non-focal.          Labs on Admission:  Basic Metabolic Panel:  Recent Labs Lab 04/29/14 0855 05/02/14 1446  NA 138 138  K 4.7 4.7  CL 108 103  CO2 23 21  GLUCOSE 90 111*  BUN 68* 73*  CREATININE 4.90* 5.85*  CALCIUM 8.6 8.5   Liver Function Tests:  Recent Labs Lab 04/29/14 0855 05/02/14 1446  AST 18 18  ALT 20 21  ALKPHOS 77 81  BILITOT 0.4 0.3  PROT 6.8 6.9  ALBUMIN 3.4* 3.0*    Recent Labs Lab 04/29/14 0855 05/02/14 1446  LIPASE 92* 113*   No results found for this basename: AMMONIA,  in the last 168 hours CBC:  Recent Labs Lab 04/29/14 0855 05/02/14 1446  WBC 3.3* 3.0*  NEUTROABS 1.7 1.5*  HGB 10.5* 10.3*  HCT 31.6* 31.1*  MCV 88.8 90.1  PLT 166 129*   Cardiac Enzymes: No results found for this basename: CKTOTAL, CKMB, CKMBINDEX, TROPONINI,  in the last 168 hours  BNP (last 3 results) No results found for this basename: PROBNP,  in the last 8760 hours CBG: No results found for this basename: GLUCAP,  in the last 168  hours  Radiological Exams on Admission: No results found.  EKG: Independently reviewed. Not done  Assessment/Plan Active Problems:   ADENOCARCINOMA, PROSTATE, GLEASON GRADE 3   HYPERTENSION   Loss of weight   Renal failure   Hypotension   N/V- clears, NPO after midnight, PRN zofran  Difficulty swallowing- barium swallow, GI consult for possible EGD  Weight loss- from N/V and inability to eat  AKI- IVF and recheck in AM, K is fine, hold ACE and diuretic, BP on lower side- Cr baseline 2 (follows with Dr. Posey Pronto)  H/o prostate CA- recent chest, abd, pelvis scan  Hypotension- hold BP meds  Pancytopenia- monitor  Lipase elevated- ?  Pancreatitis, NPO  GI- [facility]- will see tomm  Code Status: full Family Communication: patient Disposition Plan: admit  Time spent: 75 min  [facility] Pager [phone]  **Disclaimer: This note may have been dictated with voice recognition software. Similar sounding words can inadvertently be transcribed and this note may contain transcription errors which may not have been corrected upon publication of note.**

## 2014-05-03 LAB — CBC
HEMATOCRIT: 30.1 % — AB (ref 39.0–52.0)
HEMOGLOBIN: 9.9 g/dL — AB (ref 13.0–17.0)
MCH: 29.6 pg (ref 26.0–34.0)
MCHC: 32.9 g/dL (ref 30.0–36.0)
MCV: 90.1 fL (ref 78.0–100.0)
Platelets: 131 10*3/uL — ABNORMAL LOW (ref 150–400)
RBC: 3.34 MIL/uL — AB (ref 4.22–5.81)
RDW: 13.2 % (ref 11.5–15.5)
WBC: 7.5 10*3/uL (ref 4.0–10.5)

## 2014-05-03 LAB — BASIC METABOLIC PANEL
BUN: 66 mg/dL — ABNORMAL HIGH (ref 6–23)
CHLORIDE: 108 meq/L (ref 96–112)
CO2: 21 mEq/L (ref 19–32)
Calcium: 8.3 mg/dL — ABNORMAL LOW (ref 8.4–10.5)
Creatinine, Ser: 5.34 mg/dL — ABNORMAL HIGH (ref 0.50–1.35)
GFR calc Af Amer: 11 mL/min — ABNORMAL LOW (ref >90)
GFR, EST NON AFRICAN AMERICAN: 10 mL/min — AB (ref >90)
GLUCOSE: 107 mg/dL — AB (ref 70–99)
POTASSIUM: 4.9 meq/L (ref 3.7–5.3)
SODIUM: 140 meq/L (ref 137–147)

## 2014-05-03 LAB — LIPASE, BLOOD: LIPASE: 100 U/L — AB (ref 11–59)

## 2014-05-03 MED ORDER — KCL IN DEXTROSE-NACL 20-5-0.45 MEQ/L-%-% IV SOLN
INTRAVENOUS | Status: DC
  Administered 2014-05-03 – 2014-05-06 (×6): via INTRAVENOUS
  Administered 2014-05-06: 100 mL/h via INTRAVENOUS
  Administered 2014-05-07 – 2014-05-10 (×8): via INTRAVENOUS
  Filled 2014-05-03 (×19): qty 1000

## 2014-05-03 NOTE — Telephone Encounter (Signed)
The patient has been admitted to [facility] over the weekend.  Please let nephrology office know.

## 2014-05-03 NOTE — Progress Notes (Signed)
Pt has vomited since coming to the floor, zofran not very helpful

## 2014-05-03 NOTE — Consult Note (Signed)
Referring Provider: Eulogio Bear, DO a  ([facility]) Primary Care Physician:  [name], NP Primary Gastroenterologist:  Dr. Carlean Purl  Reason for Consultation:  Dysphagia and weight loss.  HPI: David [name] is a 73 y.o. 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 Silvano Rusk from 3 years ago when he had a screening colonoscopy, who was admitted to the hospital yesterday with significant dehydration because, for the past 2 weeks, he has been completely unable to eat food (which comes back up immediately after swallowing it), and almost equally severe dysphagia to liquids. Associated with this has been a 25 pound weight loss over the past 2 months, although, even on careful questioning, I am not able to obtain a history of dysphagia symptoms prior to 2 weeks ago.  The patient stopped smoking 5 years ago and has no history of alcohol abuse. The patient admits to occasional nocturnal reflux symptoms, but no frank problem with gastroesophageal reflux disease.  The patient is awaiting a barium swallow examination, which will probably be done tomorrow because it is the weekend.   Past Medical History  Diagnosis Date  . Hypertension     followed by [facility] (Dr Dani Gobble Croitoru)  . Hyperlipidemia   . Cancer     hx of prostate; s/p radioactive seed implant 10/2009 Dr Janice Norrie  . 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 History of cardiac cath 2004    negative for CAD  . History of myocardial perfusion scan 02/2010    negative for coronary insufficiency (LVEF 27%)  . Anemia, iron deficiency 11/15/2011  . Nonischemic cardiomyopathy   . Sinus bradycardia     Past Surgical History  Procedure Laterality Date  . Radioactive seed implant  2010    prostate cancer  . Cardiac catheterization  02/04/2003    mildly depressed LV systolic fx EF [id number] coronaries/abdominal aorta/renal  arteries.  . US echocardiography  02/06/2012    mild LVH,LA mod. dilated,mild-mod. 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 AI.  Marland Kitchen Nm myocar perf wall motion  02/21/2010    normal    Prior to Admission medications   Medication Sig Start Date End Date Taking? Authorizing Provider  acetaminophen (TYLENOL) 500 MG tablet Take 500 mg by mouth 2 (two) times daily.   Yes Historical Provider, MD  amLODipine (NORVASC) 5 MG tablet Take 1 tablet (5 mg total) by mouth daily. 05/01/14  Yes [name], NP  aspirin 325 MG EC tablet Take 325 mg by mouth daily.     Yes Historical Provider, MD  carvedilol (COREG) 25 MG tablet Take 1 tablet (25 mg total) by mouth 2 (two) times daily. 12/29/13  Yes Mihai Croitoru, MD  erythromycin ophthalmic ointment Place 1 application into the right eye at bedtime. 04/30/14  Yes Historical Provider, MD  finasteride (PROSCAR) 5 MG tablet Take 5 mg by mouth daily.    Yes Historical Provider, MD  NEOMYCIN-POLYMYXIN-HC, OPHTH, SUSP Place 2 drops into both eyes 2 (two) times daily. 04/29/14  Yes [name], NP  ondansetron (ZOFRAN ODT) 4 MG disintegrating tablet Take 1 tablet (4 mg total) by mouth every 8 (eight) hours as needed for nausea or vomiting. 04/29/14  Yes [name], NP  terazosin (HYTRIN) 10 MG capsule Take 10 mg by mouth at bedtime.    Yes Historical Provider, MD  VIGAMOX 0.5 % ophthalmic solution Place 1 drop into  the right eye every 2 (two) hours while awake. 04/30/14  Yes Historical Provider, MD  losartan-hydrochlorothiazide (HYZAAR) 100-12.5 MG per tablet Take 1 tablet by mouth daily.    Historical Provider, MD    Current Facility-Administered Medications  Medication Dose Route Frequency Provider Last Rate Last Dose  . acetaminophen (TYLENOL) tablet 500 mg  500 mg Oral BID Geradine Girt, DO   500 mg at 05/03/14 1000  . antiseptic oral rinse (BIOTENE) solution 15 mL  15 mL Mouth Rinse q12n4p Jessica U Vann, DO   15 mL at  05/03/14 1200  . chlorhexidine (PERIDEX) 0.12 % solution 15 mL  15 mL Mouth Rinse BID Jessica U Vann, DO   15 mL at 05/03/14 1017  . dextrose 5 % and 0.45 % NaCl with KCl 20 mEq/L infusion   Intravenous Continuous Velvet Bathe, MD 100 mL/hr at 05/03/14 1319    . dorzolamide (TRUSOPT) 2 % ophthalmic solution 1 drop  1 drop Both Eyes TID Geradine Girt, DO   1 drop at 05/03/14 1019  . heparin injection 5,000 Units  5,000 Units Subcutaneous 3 times per day Geradine Girt, DO   5,000 Units at 05/03/14 1319  . latanoprost (XALATAN) 0.005 % ophthalmic solution 1 drop  1 drop Both Eyes QHS Geradine Girt, DO      . neomycin-polymyxin b-dexamethasone (MAXITROL) ophthalmic suspension 2 drop  2 drop Both Eyes BID Geradine Girt, DO   2 drop at 05/03/14 1019  . ondansetron (ZOFRAN) tablet 4 mg  4 mg Oral Q6H PRN Geradine Girt, DO       Or  . ondansetron (ZOFRAN) injection 4 mg  4 mg Intravenous Q6H PRN Geradine Girt, DO   4 mg at 05/03/14 0236  . sodium chloride 0.9 % injection 3 mL  3 mL Intravenous Q12H Jessica U Vann, DO   3 mL at 05/03/14 1000  . timolol (TIMOPTIC) 0.5 % ophthalmic solution 1 drop  1 drop Left Eye Daily Geradine Girt, DO   1 drop at 05/03/14 1019    Allergies as of 05/02/2014 - Review Complete 05/02/2014  Allergen Reaction Noted  . Cozaar [losartan potassium] Other (See Comments) 07/23/2013    Family History  Problem Relation Age of Onset  . Hypertension Mother   . Thyroid disease Mother   . Cholelithiasis Daughter   . Cholelithiasis Son   . Hypertension Maternal Grandmother   . Diabetes Maternal Grandmother   . Heart attack Neg Hx   . Hyperlipidemia Neg Hx     History   Social History  . Marital Status: Married    Spouse Name: N/A    Number of Children: 2  . Years of Education: N/A   Occupational History  . retired    Social History Main Topics  . Smoking status: Former Smoker    Quit date: 12/04/2005  . Smokeless tobacco: Never Used  . Alcohol Use: Yes      Comment: once a week-wine  . Drug Use: No  . Sexual Activity: Yes   Other Topics Concern  . Not on file   Social History Narrative   Stays active at home   Regular exercise: no    Review of Systems: Family history of gallbladder disease and is on, no family history of other GI illnesses such as colon cancer. No lower GI tract symptoms such as diarrhea or constipation. No abdominal pain. See history of present illness for other pertinent positives and negatives.  Physical Exam: Vital signs in last 24 hours: Temp:  [98.4 F (36.9 C)-99.2 F (37.3 C)] 98.8 F (37.1 C) (05/31 0556) Pulse Rate:  [56-80] 58 (05/31 0556) Resp:  [16] 16 (05/31 0556) BP: (136-155)/(69-89) 155/75 mmHg (05/31 0556) SpO2:  [94 %-100 %] 100 % (05/31 0556) Weight:  [88.134 kg (194 lb 4.8 oz)] 88.134 kg (194 lb 4.8 oz) (05/30 1643) Last BM Date: 05/02/14  This is a pleasant, fairly stocky African American male who, despite his 25 pound weight loss, does not look at all cachectic at this time. Oropharynx is benign, no cervical or supraclavicular adenopathy appreciated. Chest and heart are unremarkable, abdomen is without guarding, mass effect, or tenderness  Intake/Output from previous day: 05/30 0701 - 05/31 0700 In: 406.3 [I.V.:406.3] Out: 2050 [Urine:1000; Emesis/NG output:1050] Intake/Output this shift: Total I/O In: 750 [I.V.:750] Out: 400 [Urine:400]  Lab Results:  Recent Labs  05/02/14 1446 05/03/14 0747  WBC 3.0* 7.5  HGB 10.3* 9.9*  HCT 31.1* 30.1*  PLT 129* 131*   BMET  Recent Labs  05/02/14 1446 05/03/14 0747  NA 138 140  K 4.7 4.9  CL 103 108  CO2 21 21  GLUCOSE 111* 107*  BUN 73* 66*  CREATININE 5.85* 5.34*  CALCIUM 8.5 8.3*   LFT  Recent Labs  05/02/14 1446  PROT 6.9  ALBUMIN 3.0*  AST 18  ALT 21  ALKPHOS 81  BILITOT 0.3   PT/INR No results found for this basename: LABPROT, INR,  in the last 72 hours  Studies/Results: No results  found.  Impression: Severe dysphagia symptoms and weight loss. My greatest concern is of esophageal cancer, although conceivably achalasia might present in this fashion  Plan: Await results of barium swallow. Probable endoscopic evaluation thereafter, with interventions to depend on the radiographic findings   LOS: 1 day   Herbie Baltimore Deunta Beneke V  05/03/2014, 1:57 PM

## 2014-05-03 NOTE — Progress Notes (Signed)
[facility] PROGRESS NOTE  David [name] [id number] DOB: 1941-08-10 DOA: 05/02/2014 PCP: [name], NP  Assessment/Plan: Active Problems:    Loss of weight/dysphagia - GI on board while patient in house - awaiting barium swallow test (most likely to be done tomorrow 05/04/2014)    Renal failure - Will place on maintenance IV fluids and reassess. Patient has history of chronic renal failure and I suspect currently he has component of prerenal causes given history of poor oral intake nausea and vomiting. - Will reassess serum creatinine levels next a.m.    Hypotension - Most likely due to poor oral intake and dehydration. Has resolved with IV fluids.  Code Status: Full Family Communication: Discussed directly with patient Disposition Plan: Pending further workup   Consultants:  GI  Procedures:  Barium swallow for next a.m.  Antibiotics:  None  HPI/Subjective: Patient has no new complaints.  Objective: Filed Vitals:   05/03/14 1445  BP: 145/76  Pulse: 60  Temp: 97.9 F (36.6 C)  Resp: 16    Intake/Output Summary (Last 24 hours) at 05/03/14 1512 Last data filed at 05/03/14 1446  Gross per 24 hour  Intake 1156.25 ml  Output   2650 ml  Net -1493.75 ml   Filed Weights   05/02/14 1643  Weight: 88.134 kg (194 lb 4.8 oz)    Exam:   General:  Patient in no acute distress, alert and awake  Cardiovascular: Regular rate and rhythm, no murmurs or rubs  Respiratory: Clear to auscultation bilaterally no wheezes  Abdomen: Soft, nondistended, nontender  Musculoskeletal: No cyanosis or clubbing   Data Reviewed: Basic Metabolic Panel:  Recent Labs Lab 04/29/14 0855 05/02/14 1446 05/03/14 0747  NA 138 138 140  K 4.7 4.7 4.9  CL 108 103 108  CO2 23 21 21   GLUCOSE 90 111* 107*  BUN 68* 73* 66*  CREATININE 4.90* 5.85* 5.34*  CALCIUM 8.6 8.5 8.3*   Liver Function Tests:  Recent Labs Lab 04/29/14 0855 05/02/14 1446  AST 18  18  ALT 20 21  ALKPHOS 77 81  BILITOT 0.4 0.3  PROT 6.8 6.9  ALBUMIN 3.4* 3.0*    Recent Labs Lab 04/29/14 0855 05/02/14 1446 05/03/14 0747  LIPASE 92* 113* 100*   No results found for this basename: AMMONIA,  in the last 168 hours CBC:  Recent Labs Lab 04/29/14 0855 05/02/14 1446 05/03/14 0747  WBC 3.3* 3.0* 7.5  NEUTROABS 1.7 1.5*  --   HGB 10.5* 10.3* 9.9*  HCT 31.6* 31.1* 30.1*  MCV 88.8 90.1 90.1  PLT 166 129* 131*   Cardiac Enzymes: No results found for this basename: CKTOTAL, CKMB, CKMBINDEX, TROPONINI,  in the last 168 hours BNP (last 3 results) No results found for this basename: PROBNP,  in the last 8760 hours CBG: No results found for this basename: GLUCAP,  in the last 168 hours  Recent Results (from the past 240 hour(s))  URINE CULTURE     Status: None   Collection Time    04/29/14  8:58 AM      Result Value Ref Range Status   Colony Count NO GROWTH   Final   Organism ID, Bacteria NO GROWTH   Final     Studies: No results found.  Scheduled Meds: . acetaminophen  500 mg Oral BID  . antiseptic oral rinse  15 mL Mouth Rinse q12n4p  . chlorhexidine  15 mL Mouth Rinse BID  . dorzolamide  1 drop Both Eyes TID  .  heparin  5,000 Units Subcutaneous 3 times per day  . latanoprost  1 drop Both Eyes QHS  . neomycin-polymyxin b-dexamethasone  2 drop Both Eyes BID  . sodium chloride  3 mL Intravenous Q12H  . timolol  1 drop Left Eye Daily   Continuous Infusions: . dextrose 5 % and 0.45 % NaCl with KCl 20 mEq/L 100 mL/hr at 05/03/14 1319    Time spent: > 35 minutes    [place] [facility] Pager [phone] If 7PM-7AM, please contact night-coverage at [email or web address], password [facility] 05/03/2014, 3:12 PM  LOS: 1 day

## 2014-05-04 ENCOUNTER — Inpatient Hospital Stay (HOSPITAL_COMMUNITY): Payer: Medicare HMO

## 2014-05-04 LAB — BASIC METABOLIC PANEL
BUN: 56 mg/dL — AB (ref 6–23)
CO2: 19 mEq/L (ref 19–32)
Calcium: 8.4 mg/dL (ref 8.4–10.5)
Chloride: 114 mEq/L — ABNORMAL HIGH (ref 96–112)
Creatinine, Ser: 4.81 mg/dL — ABNORMAL HIGH (ref 0.50–1.35)
GFR calc non Af Amer: 11 mL/min — ABNORMAL LOW (ref >90)
GFR, EST AFRICAN AMERICAN: 13 mL/min — AB (ref >90)
Glucose, Bld: 115 mg/dL — ABNORMAL HIGH (ref 70–99)
POTASSIUM: 4.5 meq/L (ref 3.7–5.3)
Sodium: 143 mEq/L (ref 137–147)

## 2014-05-04 LAB — LIPASE, BLOOD: Lipase: 128 U/L — ABNORMAL HIGH (ref 11–59)

## 2014-05-04 MED ORDER — SODIUM CHLORIDE 0.9 % IV SOLN
INTRAVENOUS | Status: DC
  Administered 2014-05-05: 02:00:00 via INTRAVENOUS

## 2014-05-04 NOTE — Progress Notes (Signed)
[facility] PROGRESS NOTE  David [name] [id number] DOB: 1941-01-29 DOA: 05/02/2014 PCP: [name], NP  Assessment/Plan: Active Problems:    Loss of weight/dysphagia - GI on board while patient in house - Barium swallow test completed and preliminary report from radiologist reporting results negative for esophageal abnormalities. Plan is for further GI workup with EGD on 05/05/2049    Renal failure - Will place on maintenance IV fluids and reassess. Patient has history of chronic renal failure and I suspect currently he has component of prerenal causes given history of poor oral intake nausea and vomiting. - Serum creatinine improved with IV fluid administration.    Hypotension - Most likely due to poor oral intake and dehydration. Has resolved with IV fluids.  Code Status: Full Family Communication: Discussed directly with patient Disposition Plan: Pending further workup   Consultants:  GI  Procedures:  Barium swallow for next a.m.  Antibiotics:  None  HPI/Subjective: Patient has no new complaints.  Objective: Filed Vitals:   05/04/14 1350  BP: 175/70  Pulse: 60  Temp: 98.1 F (36.7 C)  Resp: 20    Intake/Output Summary (Last 24 hours) at 05/04/14 1708 Last data filed at 05/04/14 1553  Gross per 24 hour  Intake   2760 ml  Output   2025 ml  Net    735 ml   Filed Weights   05/02/14 1643  Weight: 88.134 kg (194 lb 4.8 oz)    Exam:   General:  Patient in no acute distress, alert and awake  Cardiovascular: Regular rate and rhythm, no murmurs or rubs  Respiratory: Clear to auscultation bilaterally no wheezes  Abdomen: Soft, nondistended, nontender  Musculoskeletal: No cyanosis or clubbing   Data Reviewed: Basic Metabolic Panel:  Recent Labs Lab 04/29/14 0855 05/02/14 1446 05/03/14 0747 05/04/14 0500  NA 138 138 140 143  K 4.7 4.7 4.9 4.5  CL 108 103 108 114*  CO2 23 21 21 19   GLUCOSE 90 111* 107* 115*  BUN 68*  73* 66* 56*  CREATININE 4.90* 5.85* 5.34* 4.81*  CALCIUM 8.6 8.5 8.3* 8.4   Liver Function Tests:  Recent Labs Lab 04/29/14 0855 05/02/14 1446  AST 18 18  ALT 20 21  ALKPHOS 77 81  BILITOT 0.4 0.3  PROT 6.8 6.9  ALBUMIN 3.4* 3.0*    Recent Labs Lab 04/29/14 0855 05/02/14 1446 05/03/14 0747 05/04/14 0500  LIPASE 92* 113* 100* 128*   No results found for this basename: AMMONIA,  in the last 168 hours CBC:  Recent Labs Lab 04/29/14 0855 05/02/14 1446 05/03/14 0747  WBC 3.3* 3.0* 7.5  NEUTROABS 1.7 1.5*  --   HGB 10.5* 10.3* 9.9*  HCT 31.6* 31.1* 30.1*  MCV 88.8 90.1 90.1  PLT 166 129* 131*   Cardiac Enzymes: No results found for this basename: CKTOTAL, CKMB, CKMBINDEX, TROPONINI,  in the last 168 hours BNP (last 3 results) No results found for this basename: PROBNP,  in the last 8760 hours CBG: No results found for this basename: GLUCAP,  in the last 168 hours  Recent Results (from the past 240 hour(s))  URINE CULTURE     Status: None   Collection Time    04/29/14  8:58 AM      Result Value Ref Range Status   Colony Count NO GROWTH   Final   Organism ID, Bacteria NO GROWTH   Final     Studies: Dg Esophagus  05/04/2014   CLINICAL DATA:  Emesis.  Dysphagia.  EXAM: ESOPHOGRAM/BARIUM SWALLOW  TECHNIQUE: Combined double contrast and single contrast examination performed using effervescent crystals, thick barium liquid, and thin barium liquid.  FLUOROSCOPY TIME:  2 min 14 seconds.  COMPARISON:  CT 04/29/2014.  FINDINGS: This is a slightly limited exam as patient could not turn for lateral view. Cervical and thoracic esophagus widely patent. No evidence of obstructing lesion. No reflux demonstrated. Barium tablet passed easily into the stomach.  IMPRESSION: Normal exam.   Electronically Signed   By: Marcello Moores  Register   On: 05/04/2014 12:18    Scheduled Meds: . acetaminophen  500 mg Oral BID  . antiseptic oral rinse  15 mL Mouth Rinse q12n4p  . chlorhexidine  15 mL  Mouth Rinse BID  . dorzolamide  1 drop Both Eyes TID  . heparin  5,000 Units Subcutaneous 3 times per day  . latanoprost  1 drop Both Eyes QHS  . neomycin-polymyxin b-dexamethasone  2 drop Both Eyes BID  . sodium chloride  3 mL Intravenous Q12H  . timolol  1 drop Left Eye Daily   Continuous Infusions: . dextrose 5 % and 0.45 % NaCl with KCl 20 mEq/L 100 mL/hr at 05/04/14 1020    Time spent: > 35 minutes    [place] Pager [phone] If 7PM-7AM, please contact night-coverage at [email or web address], password [facility] 05/04/2014, 5:08 PM  LOS: 2 days

## 2014-05-04 NOTE — Progress Notes (Signed)
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   INTERVENTION: -Recommend MagicCup TID w/diet advancement -Diet advancement per MD -Will continue to monitor  NUTRITION DIAGNOSIS: Inadequate oral intake related to inability to eat as evidenced by NPO status.   Goal: Pt to meet >/= 90% of their estimated nutrition needs    Monitor:  Diet order, swallow profile, total protein/energy intake, GI profile, labs, weights  Reason for Assessment: MST  73 y.o. male  Admitting Dx: <principal problem not specified>  ASSESSMENT: David [name] is a 73 y.o. male Who was in the process of being worked up by his PCP for nausea, vomiting and weight loss. Few days ago, patient was found to have worsening renal failure but declined ER visit. He presents today with worsening renal failure   -Pt endorsed 22 lbs wt loss in past 4 months -Diet recall indicates pt consuming softer foods and supplements. Would tolerate for 1-2 hours; however would then result in emesis. Tried Ensure supplements; however did not like taste and resulted in vomiting. Was unable to try blander/starchy foods (breads/crackers) as they got caught in throat. Was able to consume some Gatorade for electrolytes -MD noted pt with AKI, baseline Crt 2.0. Currently 4.81. Receiving IVF -NPO for MBS to occur today (6/01) -Pt willing to try MagicCup supplements as diet advancement tolerated -Lipase elevated, MD noted possible for pancreatitis Nutrition Focused Physical Exam:  Subcutaneous Fat:  Orbital Region: WDL Upper Arm Region: WDL Thoracic and Lumbar Region: WDL  Muscle:  Temple Region: WDL Clavicle Bone Region: moderate wasting Clavicle and Acromion Bone Region:  moderate wasting Scapular Bone Region: moderate wasting Dorsal Hand: WDL Patellar Region:severe wasting Anterior Thigh Region: severe wasting Posterior Calf Region: moderate wasting  Edema: none noted   -   Height: Ht Readings from Last 1 Encounters:  05/02/14 6\' 2"  (1.88 m)    Weight: Wt Readings from Last 1 Encounters:  05/02/14 194 lb 4.8 oz (88.134 kg)    Ideal Body Weight: 190 lbs  % Ideal Body Weight: 102%  Wt Readings from Last 10 Encounters:  05/02/14 194 lb 4.8 oz (88.134 kg)  04/29/14 188 lb (85.276 kg)  01/28/14 210 lb 1.3 oz (95.292 kg)  10/22/13 216 lb 0.6 oz (97.995 kg)  09/09/13 217 lb 11.2 oz (98.748 kg)  07/23/13 217 lb (98.431 kg)  04/16/13 214 lb 1.9 oz (97.124 kg)  03/27/13 215 lb (97.523 kg)  03/06/13 226 lb (102.513 kg)  01/08/13 233 lb (105.688 kg)    Usual Body Weight: 216 lbs  % Usual Body Weight: 90%  BMI:  Body mass index is 24.94 kg/(m^2).  Estimated Nutritional Needs: Kcal: 2200-2400 Protein: 90-105 gram Fluid: >/=2200 ml/daily  Skin: WDL, no edema  Diet Order: NPO  EDUCATION NEEDS: -No education needs identified at this time   Intake/Output Summary (Last 24 hours) at 05/04/14 1139 Last data filed at 05/04/14 0900  Gross per 24 hour  Intake   2465 ml  Output   1675 ml  Net    790 ml    Last BM: 5/31   Labs:   Recent Labs Lab 05/02/14 1446 05/03/14 0747 05/04/14 0500  NA 138 140 143  K 4.7 4.9 4.5  CL 103 108 114*  CO2 21 21 19   BUN 73* 66* 56*  CREATININE 5.85* 5.34* 4.81*  CALCIUM 8.5 8.3* 8.4  GLUCOSE 111* 107* 115*    CBG (last 3)  No results found for this basename: GLUCAP,  in the last 72 hours  Scheduled Meds: . acetaminophen  500 mg Oral BID  . antiseptic oral rinse  15 mL Mouth Rinse q12n4p  . chlorhexidine  15 mL Mouth Rinse BID  . dorzolamide  1 drop Both Eyes TID  . heparin  5,000 Units Subcutaneous 3 times per day  . latanoprost  1 drop Both Eyes QHS  . neomycin-polymyxin  b-dexamethasone  2 drop Both Eyes BID  . sodium chloride  3 mL Intravenous Q12H  . timolol  1 drop Left Eye Daily    Continuous Infusions: . dextrose 5 % and 0.45 % NaCl with KCl 20 mEq/L 100 mL/hr at 05/04/14 1020    Past Medical History  Diagnosis Date  . Hypertension     followed by [facility] (Dr Dani Gobble Croitoru)  . Hyperlipidemia   . Cancer     hx of prostate; s/p radioactive seed implant 10/2009 Dr Janice Norrie  . 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 History of cardiac cath 2004    negative for CAD  . History of myocardial perfusion scan 02/2010    negative for coronary insufficiency (LVEF 27%)  . Anemia, iron deficiency 11/15/2011  . Nonischemic cardiomyopathy   . Sinus bradycardia     Past Surgical History  Procedure Laterality Date  . Radioactive seed implant  2010    prostate cancer  . Cardiac catheterization  02/04/2003    mildly depressed LV systolic fx EF [id number] coronaries/abdominal aorta/renal arteries.  . US echocardiography  02/06/2012    mild LVH,LA mod. dilated,mild-mod. 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 AI.  Marland Kitchen Nm myocar perf wall motion  02/21/2010    normal    Atlee Abide MS RD LDN Clinical Dietitian [id number]

## 2014-05-04 NOTE — Evaluation (Signed)
Physical Therapy Evaluation Patient Details Name: David [name] MRN: [id number] DOB: [birth year] Today's Date: 05/04/2014   History of Present Illness  73 y.o. male admitted 5/30 with significant dehydration due to unable to eat food and weight loss with PMHx of CHF, HTN, prostate cancer.  Clinical Impression  Pt currently with functional limitations due to the deficits listed below (see PT Problem List). Pt will benefit from skilled PT to increase their independence and safety with mobility to allow discharge to the venue listed below.  Pt reports weakness and fatigue since admission due to mostly remaining in bed and agreeable to PT to assist with mobility.  Pt will likely return to baseline upon d/c.     Follow Up Recommendations No PT follow up    Equipment Recommendations  Rolling walker with 5" wheels (possibly, will see how pt progresses)    Recommendations for Other Services       Precautions / Restrictions        Mobility  Bed Mobility Overal bed mobility: Modified Independent                Transfers Overall transfer level: Needs assistance Equipment used: Rolling walker (2 wheeled) Transfers: Sit to/from Stand Sit to Stand: Min guard         General transfer comment: verbal cues for hand placement  Ambulation/Gait Ambulation/Gait assistance: Min guard Ambulation Distance (Feet): 120 Feet Assistive device: Rolling walker (2 wheeled) Gait Pattern/deviations: Step-through pattern Gait velocity: decr   General Gait Details: verbal cues for safe use of RW, pt fatigued quickly, reports hasn't ambulated since admission  Stairs            Wheelchair Mobility    Modified Rankin (Stroke Patients Only)       Balance                                             Pertinent Vitals/Pain No pain reported    Home Living Family/patient expects to be discharged to:: Private residence Living Arrangements: Spouse/significant other    Type of Home: House Home Access: Stairs to enter   Technical brewer of Steps: 3 Home Layout: One level Home Equipment: Cane - single point      Prior Function Level of Independence: Independent               Hand Dominance        Extremity/Trunk Assessment               Lower Extremity Assessment: Generalized weakness         Communication   Communication: No difficulties  Cognition Arousal/Alertness: Awake/alert Behavior During Therapy: WFL for tasks assessed/performed Overall Cognitive Status: Within Functional Limits for tasks assessed                      General Comments      Exercises        Assessment/Plan    PT Assessment Patient needs continued PT services  PT Diagnosis Difficulty walking;Generalized weakness   PT Problem List Decreased strength;Decreased activity tolerance;Decreased mobility;Decreased knowledge of use of DME  PT Treatment Interventions DME instruction;Gait training;Stair training;Functional mobility training;Therapeutic activities;Therapeutic exercise;Patient/family education   PT Goals (Current goals can be found in the Care Plan section) Acute Rehab PT Goals PT Goal Formulation: With patient Time For Goal Achievement: 05/11/14 Potential to  Achieve Goals: Good    Frequency Min 3X/week   Barriers to discharge        Co-evaluation               End of Session Equipment Utilized During Treatment: Gait belt Activity Tolerance: Patient limited by fatigue Patient left: Other (comment);with call bell/phone within reach [facility]) Nurse Communication: Mobility status (aware pt on BSC with call bell, RN sitting outside pt's room)         Time: [id number] PT Time Calculation (min): 13 min   Charges:   PT Evaluation $Initial PT Evaluation Tier I: 1 Procedure PT Treatments $Gait Training: 8-22 mins   PT G CodesJunius Argyle 05/04/2014, 3:43 PM Carmelia Bake, PT,  DPT 05/04/2014 Pager: [phone]

## 2014-05-04 NOTE — Progress Notes (Signed)
Patient complained of some "chest discomfort" this am. Patient was unable to describe pain. EKG done and showed sinus bradycardia. Patient has had no changes on telemetry at this point. VS stable. MD notified of findings. No new orders at this time. Will continue to monitor patient. David [name]

## 2014-05-04 NOTE — Telephone Encounter (Signed)
Notified David [name] at [employer]. She states appt has been cancelled at this time.

## 2014-05-04 NOTE — Progress Notes (Signed)
Patient had 8 bts of PVC's, and 7 bts of SVT, pt is asymptomatic, no SOB, no CP, he is sleeping. Will conitinue to monitor the patient. NP was paged. David [name] :)

## 2014-05-04 NOTE — Progress Notes (Signed)
[facility] Progress Note  Subjective: Tolerated barium swallow without vomiting  Objective: Vital signs in last 24 hours: Temp:  [97.9 F (36.6 C)-98.7 F (37.1 C)] 98.3 F (36.8 C) (06/01 0532) Pulse Rate:  [55-60] 55 (06/01 0532) Resp:  [16-18] 18 (06/01 0532) BP: (145-155)/(70-76) 153/76 mmHg (06/01 0532) SpO2:  [98 %-100 %] 98 % (06/01 0532) Weight change:    [id number]  Lab Results: Results for orders placed during the hospital encounter of 05/02/14 (from the past 24 hour(s))  LIPASE, BLOOD     Status: Abnormal   Collection Time    05/04/14  5:00 AM      Result Value Ref Range   Lipase 128 (*) 11 - 59 U/L  BASIC METABOLIC PANEL     Status: Abnormal   Collection Time    05/04/14  5:00 AM      Result Value Ref Range   Sodium 143  137 - 147 mEq/L   Potassium 4.5  3.7 - 5.3 mEq/L   Chloride 114 (*) 96 - 112 mEq/L   CO2 19  19 - 32 mEq/L   Glucose, Bld 115 (*) 70 - 99 mg/dL   BUN 56 (*) 6 - 23 mg/dL   Creatinine, Ser 4.81 (*) 0.50 - 1.35 mg/dL   Calcium 8.4  8.4 - 10.5 mg/dL   GFR calc non Af Amer 11 (*) >90 mL/min   GFR calc Af Amer 13 (*) >90 mL/min    Studies/Results: No results found.    Assessment: Barium Swallow negative for esophageal abnormalities, preliminary report from radiologist. Apparent nausea and vomiting with weight loss rather than dysphagia. Plan: Will proceed with EGD tomorrow to rule out a gastric or duodenal outlet obstruction or peptic ulcer disease or neoplasm.     David [name] 05/04/2014, 11:53 AM

## 2014-05-05 ENCOUNTER — Inpatient Hospital Stay (HOSPITAL_COMMUNITY): Payer: Medicare HMO | Admitting: Anesthesiology

## 2014-05-05 ENCOUNTER — Encounter (HOSPITAL_COMMUNITY): Payer: Self-pay | Admitting: *Deleted

## 2014-05-05 ENCOUNTER — Encounter (HOSPITAL_COMMUNITY): Payer: Medicare HMO | Admitting: Anesthesiology

## 2014-05-05 ENCOUNTER — Ambulatory Visit: Payer: Commercial Managed Care - HMO | Admitting: Family

## 2014-05-05 ENCOUNTER — Encounter (HOSPITAL_COMMUNITY)
Admission: EM | Disposition: A | Discharge status: Home / Self Care | Payer: Commercial Managed Care - HMO | Source: Home / Self Care | Attending: Internal Medicine

## 2014-05-05 DIAGNOSIS — E43 Unspecified severe protein-calorie malnutrition: Secondary | ICD-10-CM | POA: Insufficient documentation

## 2014-05-05 HISTORY — PX: ESOPHAGOGASTRODUODENOSCOPY (EGD) WITH PROPOFOL: SHX5813

## 2014-05-05 LAB — BASIC METABOLIC PANEL
BUN: 44 mg/dL — ABNORMAL HIGH (ref 6–23)
CO2: 20 mEq/L (ref 19–32)
Calcium: 8.3 mg/dL — ABNORMAL LOW (ref 8.4–10.5)
Chloride: 111 mEq/L (ref 96–112)
Creatinine, Ser: 4.22 mg/dL — ABNORMAL HIGH (ref 0.50–1.35)
GFR calc Af Amer: 15 mL/min — ABNORMAL LOW (ref >90)
GFR calc non Af Amer: 13 mL/min — ABNORMAL LOW (ref >90)
Glucose, Bld: 92 mg/dL (ref 70–99)
Potassium: 4.7 mEq/L (ref 3.7–5.3)
Sodium: 140 mEq/L (ref 137–147)

## 2014-05-05 SURGERY — ESOPHAGOGASTRODUODENOSCOPY (EGD) WITH PROPOFOL
Anesthesia: Monitor Anesthesia Care

## 2014-05-05 MED ORDER — BUTAMBEN-TETRACAINE-BENZOCAINE 2-2-14 % EX AERO
INHALATION_SPRAY | CUTANEOUS | Status: DC | PRN
  Administered 2014-05-05: 2 via TOPICAL

## 2014-05-05 MED ORDER — ONDANSETRON HCL 4 MG/2ML IJ SOLN
INTRAMUSCULAR | Status: AC
  Filled 2014-05-05: qty 2

## 2014-05-05 MED ORDER — LACTATED RINGERS IV SOLN
INTRAVENOUS | Status: DC | PRN
  Administered 2014-05-05: 13:00:00 via INTRAVENOUS

## 2014-05-05 MED ORDER — LACTATED RINGERS IV SOLN
INTRAVENOUS | Status: DC
  Administered 2014-05-05: 1000 mL via INTRAVENOUS

## 2014-05-05 MED ORDER — PROPOFOL INFUSION 10 MG/ML OPTIME
INTRAVENOUS | Status: DC | PRN
  Administered 2014-05-05: 80 ug/kg/min via INTRAVENOUS

## 2014-05-05 MED ORDER — PROPOFOL 10 MG/ML IV BOLUS
INTRAVENOUS | Status: AC
  Filled 2014-05-05: qty 20

## 2014-05-05 MED ORDER — PROMETHAZINE HCL 25 MG/ML IJ SOLN: 6.2500 mg | INTRAMUSCULAR | Status: DC | PRN

## 2014-05-05 SURGICAL SUPPLY — 15 items

## 2014-05-05 NOTE — Anesthesia Preprocedure Evaluation (Addendum)
Anesthesia Evaluation  Patient identified by MRN, date of birth, ID band Patient awake    Reviewed: Allergy & Precautions, H&P , NPO status , Patient's Chart, lab work & pertinent test results  Airway Mallampati: II TM Distance: >3 FB Neck ROM: Full    Dental no notable dental hx.    Pulmonary neg pulmonary ROS, former smoker,  breath sounds clear to auscultation  Pulmonary exam normal       Cardiovascular hypertension, Pt. on medications +CHF Rhythm:Regular Rate:Normal  negative for coronary insufficiency EF 45-50%   Neuro/Psych negative neurological ROS  negative psych ROS   GI/Hepatic negative GI ROS, Neg liver ROS,   Endo/Other  negative endocrine ROS  Renal/GU ARF and Renal InsufficiencyRenal disease  negative genitourinary   Musculoskeletal negative musculoskeletal ROS (+)   Abdominal   Peds negative pediatric ROS (+)  Hematology  (+) anemia ,   Anesthesia Other Findings   Reproductive/Obstetrics negative OB ROS                          Anesthesia Physical Anesthesia Plan  ASA: III  Anesthesia Plan: MAC   Post-op Pain Management:    Induction: Intravenous  Airway Management Planned: Nasal Cannula  Additional Equipment:   Intra-op Plan:   Post-operative Plan:   Informed Consent: I have reviewed the patients History and Physical, chart, labs and discussed the procedure including the risks, benefits and alternatives for the proposed anesthesia with the patient or authorized representative who has indicated his/her understanding and acceptance.   Dental advisory given  Plan Discussed with: CRNA and Surgeon  Anesthesia Plan Comments:        Anesthesia Quick Evaluation

## 2014-05-05 NOTE — Anesthesia Postprocedure Evaluation (Signed)
  Anesthesia Post-op Note  Patient: David [name]  Procedure(s) Performed: Procedure(s) (LRB): ESOPHAGOGASTRODUODENOSCOPY (EGD) WITH PROPOFOL (N/A)  Patient Location: PACU  Anesthesia Type: MAC  Level of Consciousness: awake and alert   Airway and Oxygen Therapy: Patient Spontanous Breathing  Post-op Pain: mild  Post-op Assessment: Post-op Vital signs reviewed, Patient's Cardiovascular Status Stable, Respiratory Function Stable, Patent Airway and No signs of Nausea or vomiting  Last Vitals:  Filed Vitals:   05/05/14 1302  BP: 165/83  Pulse:   Temp: 37.1 C  Resp: 20    Post-op Vital Signs: stable   Complications: No apparent anesthesia complications

## 2014-05-05 NOTE — Progress Notes (Signed)
Came to bedside to see patient to offer [place] Management services. However, he was off the unit in endo per nursing. Will come back at later time.  Marthenia Rolling, MSN- [facility] [phone]

## 2014-05-05 NOTE — Progress Notes (Signed)
[facility] Progress Note  Subjective: Continues to complain of vomiting or regurgitating even clear liquids  Objective: Vital signs in last 24 hours: Temp:  [98.1 F (36.7 C)-98.9 F (37.2 C)] 98.5 F (36.9 C) (06/02 0540) Pulse Rate:  [58-60] 58 (06/02 0540) Resp:  [18-20] 18 (06/02 0540) BP: (162-175)/(70-81) 164/81 mmHg (06/02 0540) SpO2:  [100 %] 100 % (06/02 0540) Weight change:    PE: Unchanged  Lab Results: Results for orders placed during the hospital encounter of 05/02/14 (from the past 24 hour(s))  BASIC METABOLIC PANEL     Status: Abnormal   Collection Time    05/05/14  4:39 AM      Result Value Ref Range   Sodium 140  137 - 147 mEq/L   Potassium 4.7  3.7 - 5.3 mEq/L   Chloride 111  96 - 112 mEq/L   CO2 20  19 - 32 mEq/L   Glucose, Bld 92  70 - 99 mg/dL   BUN 44 (*) 6 - 23 mg/dL   Creatinine, Ser 4.22 (*) 0.50 - 1.35 mg/dL   Calcium 8.3 (*) 8.4 - 10.5 mg/dL   GFR calc non Af Amer 13 (*) >90 mL/min   GFR calc Af Amer 15 (*) >90 mL/min    Studies/Results: Dg Esophagus  05/04/2014   CLINICAL DATA:  Emesis.  Dysphagia.  EXAM: ESOPHOGRAM/BARIUM SWALLOW  TECHNIQUE: Combined double contrast and single contrast examination performed using effervescent crystals, thick barium liquid, and thin barium liquid.  FLUOROSCOPY TIME:  2 min 14 seconds.  COMPARISON:  CT 04/29/2014.  FINDINGS: This is a slightly limited exam as patient could not turn for lateral view. Cervical and thoracic esophagus widely patent. No evidence of obstructing lesion. No reflux demonstrated. Barium tablet passed easily into the stomach.  IMPRESSION: Normal exam.   Electronically Signed   By: Marcello Moores  Register   On: 05/04/2014 12:18    EGD normal except for possible minimal mid body gastritis  Assessment: Puzzling picture of vomiting/regurgitation/dysphagia/weight loss with barium swallow and EGD completely unrevealing with no retained food in the stomach.  Plan: Will obtain nuclear  medicine gastric imaging study to rule out gastroparesis which appears unlikely   Missy Sabins 05/05/2014, 1:00 PM

## 2014-05-05 NOTE — Progress Notes (Signed)
[facility] PROGRESS NOTE  David [name] [id number] DOB: 05-10-1941 DOA: 05/02/2014 PCP: [name], NP Brief narrative: 73 year old with history of hypertension, prostate adenocarcinoma Gleason grade 3  Presenting with vomiting, regurgitation, reports of dysphasia and reports of weight loss currently undergoing GI workup   Assessment/Plan: Active Problems:    Loss of weight/dysphagia - GI on board while patient in house - Barium swallow test completed and preliminary report from radiologist reporting results negative for esophageal abnormalities.  - GI continuing further workup and plan will be to obtain nuclear medicine gastric imaging study to rule out gastroparesis. - Patient had EGD which was completely unrevealing with no retained food in the stomach    Renal failure - Continues to improve on maintenance IV fluids and reassess. Patient has history of chronic renal failure and I suspect currently he has component of prerenal causes given history of poor oral intake nausea and vomiting. - Serum creatinine improved with IV fluid administration.    Hypotension - Most likely due to poor oral intake and dehydration.  - Has resolved with IV fluids.  Code Status: Full Family Communication: Discussed directly with patient Disposition Plan: Pending further workup   Consultants:  GI: Dr. Amedeo Plenty  Procedures:  Barium swallow  EGD  Antibiotics:  None  HPI/Subjective: Patient has no new complaints.  Objective: Filed Vitals:   05/05/14 1327  BP: 148/80  Pulse: 62  Temp: 97.7 F (36.5 C)  Resp: 16    Intake/Output Summary (Last 24 hours) at 05/05/14 1626 Last data filed at 05/05/14 1257  Gross per 24 hour  Intake 1327.34 ml  Output    600 ml  Net 727.34 ml   Filed Weights   05/02/14 1643  Weight: 88.134 kg (194 lb 4.8 oz)    Exam:   General:  Patient in no acute distress, alert and awake  Cardiovascular: Regular rate and rhythm, no  murmurs or rubs  Respiratory: Clear to auscultation bilaterally no wheezes  Abdomen: Soft, nondistended, nontender  Musculoskeletal: No cyanosis or clubbing   Data Reviewed: Basic Metabolic Panel:  Recent Labs Lab 04/29/14 0855 05/02/14 1446 05/03/14 0747 05/04/14 0500 05/05/14 0439  NA 138 138 140 143 140  K 4.7 4.7 4.9 4.5 4.7  CL 108 103 108 114* 111  CO2 23 21 21 19 20   GLUCOSE 90 111* 107* 115* 92  BUN 68* 73* 66* 56* 44*  CREATININE 4.90* 5.85* 5.34* 4.81* 4.22*  CALCIUM 8.6 8.5 8.3* 8.4 8.3*   Liver Function Tests:  Recent Labs Lab 04/29/14 0855 05/02/14 1446  AST 18 18  ALT 20 21  ALKPHOS 77 81  BILITOT 0.4 0.3  PROT 6.8 6.9  ALBUMIN 3.4* 3.0*    Recent Labs Lab 04/29/14 0855 05/02/14 1446 05/03/14 0747 05/04/14 0500  LIPASE 92* 113* 100* 128*   No results found for this basename: AMMONIA,  in the last 168 hours CBC:  Recent Labs Lab 04/29/14 0855 05/02/14 1446 05/03/14 0747  WBC 3.3* 3.0* 7.5  NEUTROABS 1.7 1.5*  --   HGB 10.5* 10.3* 9.9*  HCT 31.6* 31.1* 30.1*  MCV 88.8 90.1 90.1  PLT 166 129* 131*   Cardiac Enzymes: No results found for this basename: CKTOTAL, CKMB, CKMBINDEX, TROPONINI,  in the last 168 hours BNP (last 3 results) No results found for this basename: PROBNP,  in the last 8760 hours CBG: No results found for this basename: GLUCAP,  in the last 168 hours  Recent Results (from the past 240 hour(s))  URINE CULTURE     Status: None   Collection Time    04/29/14  8:58 AM      Result Value Ref Range Status   Colony Count NO GROWTH   Final   Organism ID, Bacteria NO GROWTH   Final     Studies: Dg Esophagus  05/04/2014   CLINICAL DATA:  Emesis.  Dysphagia.  EXAM: ESOPHOGRAM/BARIUM SWALLOW  TECHNIQUE: Combined double contrast and single contrast examination performed using effervescent crystals, thick barium liquid, and thin barium liquid.  FLUOROSCOPY TIME:  2 min 14 seconds.  COMPARISON:  CT 04/29/2014.  FINDINGS:  This is a slightly limited exam as patient could not turn for lateral view. Cervical and thoracic esophagus widely patent. No evidence of obstructing lesion. No reflux demonstrated. Barium tablet passed easily into the stomach.  IMPRESSION: Normal exam.   Electronically Signed   By: Marcello Moores  Register   On: 05/04/2014 12:18    Scheduled Meds: . acetaminophen  500 mg Oral BID  . antiseptic oral rinse  15 mL Mouth Rinse q12n4p  . chlorhexidine  15 mL Mouth Rinse BID  . dorzolamide  1 drop Both Eyes TID  . heparin  5,000 Units Subcutaneous 3 times per day  . latanoprost  1 drop Both Eyes QHS  . neomycin-polymyxin b-dexamethasone  2 drop Both Eyes BID  . sodium chloride  3 mL Intravenous Q12H  . timolol  1 drop Left Eye Daily   Continuous Infusions: . dextrose 5 % and 0.45 % NaCl with KCl 20 mEq/L 100 mL/hr at 05/05/14 0925    Time spent: > 35 minutes    [facility] [facility] Pager [phone] If 7PM-7AM, please contact night-coverage at [email or web address], password [facility] 05/05/2014, 4:26 PM  LOS: 3 days

## 2014-05-05 NOTE — Op Note (Signed)
[facility] [place], [address]   ENDOSCOPY PROCEDURE REPORT  PATIENT: David [name], David [name]  MR#: [id number] BIRTHDATE: [birth year] , 72  yrs. old GENDER: Male ENDOSCOPIST:Davante Gerke Amedeo Plenty, MD REFERRED BY: PROCEDURE DATE:  05/05/2014 PROCEDURE: ASA CLASS: INDICATIONS:  nausea vomiting and weight loss MEDICATION:    MAC TOPICAL ANESTHETIC:    Cetacaine spray  DESCRIPTION OF PROCEDURE:   esophagus: Normal with no stricture ring diverticulum or other abnormality Stomach: Mild punctate erythema and granularity mainly in the mid body, no focal abnormality no retained food in the stomach. Antrum and pylorus completely normal Duodenum: Normal bulb and second portion     COMPLICATIONS: None  ENDOSCOPIC IMPRESSION:since the normal study with possible mild appearance of mid body gastritis, no explanation for presenting symptoms of vomiting dysphagia or weight loss.  RECOMMENDATIONS:due to persistent symptoms we'll obtain nuclear medicine gastric emptying study to rule out conclusively delayed gastric emptying.    _______________________________ Lorrin MaisTeena Irani, MD 05/05/2014 1:06 PM

## 2014-05-05 NOTE — Transfer of Care (Signed)
Immediate Anesthesia Transfer of Care Note  Patient: David [name]  Procedure(s) Performed: Procedure(s): ESOPHAGOGASTRODUODENOSCOPY (EGD) WITH PROPOFOL (N/A)  Patient Location: PACU  Anesthesia Type:MAC  Level of Consciousness: sedated  Airway & Oxygen Therapy: Patient Spontanous Breathing and Patient connected to nasal cannula oxygen  Post-op Assessment: Report given to PACU RN and Post -op Vital signs reviewed and stable  Post vital signs: Reviewed and stable  Complications: No apparent anesthesia complications

## 2014-05-05 NOTE — Progress Notes (Signed)
Came back to visit patient to offer [place] Management services. Patient pleasantly declines. Left brochure for him to call in future if he changes his mind.  Marthenia Rolling, MSN- [facility] [phone]

## 2014-05-06 ENCOUNTER — Inpatient Hospital Stay (HOSPITAL_COMMUNITY): Payer: Medicare HMO

## 2014-05-06 DIAGNOSIS — K3184 Gastroparesis: Secondary | ICD-10-CM

## 2014-05-06 DIAGNOSIS — N179 Acute kidney failure, unspecified: Secondary | ICD-10-CM

## 2014-05-06 DIAGNOSIS — N184 Chronic kidney disease, stage 4 (severe): Secondary | ICD-10-CM

## 2014-05-06 LAB — MAGNESIUM: Magnesium: 1.7 mg/dL (ref 1.5–2.5)

## 2014-05-06 MED ORDER — METOCLOPRAMIDE HCL 5 MG/ML IJ SOLN
10.0000 mg | Freq: Four times a day (QID) | INTRAMUSCULAR | Status: DC
  Administered 2014-05-06 – 2014-05-14 (×32): 10 mg via INTRAVENOUS
  Filled 2014-05-06 (×36): qty 2

## 2014-05-06 MED ORDER — TECHNETIUM TC 99M SULFUR COLLOID: 2.2000 | Freq: Once | INTRAVENOUS | Status: AC | PRN

## 2014-05-06 MED ORDER — TIMOLOL MALEATE 0.5 % OP SOLN
1.0000 [drp] | Freq: Every day | OPHTHALMIC | Status: DC
  Administered 2014-05-08: 1 [drp] via OPHTHALMIC

## 2014-05-06 NOTE — Progress Notes (Signed)
[facility] PROGRESS NOTE  David [name] [id number] DOB: 18-Nov-1941 DOA: 05/02/2014 PCP: [name], NP  Brief narrative: 73 year old with history of hypertension, prostate adenocarcinoma Gleason grade 3  Presenting with vomiting, regurgitation, reports of dysphasia and reports of weight loss currently undergoing GI workup. 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   Assessment/Plan:    Loss of weight/dysphagia - GI on board. Appreciate their recommendations. - Barium swallow/EGD WNL. -Gastric emptying scan with signs of delayed gastric emptying; ?etiology (not a diabetic). -Will start on reglan to see if any improvement.    Acute on CKD Stage IV -Baseline Cr is 1.9 from 07/2013. -Improving on IVF. -Suspect a component of prerenal azotemia given poor oral intake and GI losses).    Hypotension - Most likely due to poor oral intake and dehydration.  - Has resolved with IV fluids.  Code Status: Full Family Communication: Discussed directly with patient Disposition Plan:Home when ready.   Consultants:  GI: Dr. Amedeo Plenty  Procedures:  Barium swallow  EGD  GES  Antibiotics:  None  HPI/Subjective: Patient has no new complaints.  Objective: Filed Vitals:   05/06/14 1010  BP: 160/80  Pulse: 60  Temp: 98 F (36.7 C)  Resp: 18    Intake/Output Summary (Last 24 hours) at 05/06/14 1436 Last data filed at 05/06/14 0600  Gross per 24 hour  Intake 2178.33 ml  Output   1250 ml  Net 928.33 ml   Filed Weights   05/02/14 1643  Weight: 88.134 kg (194 lb 4.8 oz)    Exam:   General:  Patient in no acute distress, alert and awake  Cardiovascular: Regular rate and rhythm, no murmurs or rubs  Respiratory: Clear to auscultation bilaterally no wheezes  Abdomen: Soft, nondistended, nontender  Musculoskeletal: No cyanosis or clubbing   Data Reviewed: Basic Metabolic Panel:  Recent Labs Lab 05/02/14 1446 05/03/14 0747 05/04/14 0500  05/05/14 0439  NA 138 140 143 140  K 4.7 4.9 4.5 4.7  CL 103 108 114* 111  CO2 21 21 19 20   GLUCOSE 111* 107* 115* 92  BUN 73* 66* 56* 44*  CREATININE 5.85* 5.34* 4.81* 4.22*  CALCIUM 8.5 8.3* 8.4 8.3*   Liver Function Tests:  Recent Labs Lab 05/02/14 1446  AST 18  ALT 21  ALKPHOS 81  BILITOT 0.3  PROT 6.9  ALBUMIN 3.0*    Recent Labs Lab 05/02/14 1446 05/03/14 0747 05/04/14 0500  LIPASE 113* 100* 128*   No results found for this basename: AMMONIA,  in the last 168 hours CBC:  Recent Labs Lab 05/02/14 1446 05/03/14 0747  WBC 3.0* 7.5  NEUTROABS 1.5*  --   HGB 10.3* 9.9*  HCT 31.1* 30.1*  MCV 90.1 90.1  PLT 129* 131*   Cardiac Enzymes: No results found for this basename: CKTOTAL, CKMB, CKMBINDEX, TROPONINI,  in the last 168 hours BNP (last 3 results) No results found for this basename: PROBNP,  in the last 8760 hours CBG: No results found for this basename: GLUCAP,  in the last 168 hours  Recent Results (from the past 240 hour(s))  URINE CULTURE     Status: None   Collection Time    04/29/14  8:58 AM      Result Value Ref Range Status   Colony Count NO GROWTH   Final   Organism ID, Bacteria NO GROWTH   Final     Studies: Nm Gastric Emptying  05/06/2014   CLINICAL DATA:  Vomiting  EXAM:  NUCLEAR MEDICINE GASTRIC EMPTYING SCAN  TECHNIQUE: After oral ingestion of radiolabeled meal, sequential abdominal images were obtained for 120 minutes. Residual percentage of activity remaining within the stomach was calculated at 60 and 120 minutes.  RADIOPHARMACEUTICALS:  2.2 mCi Technetium 99-m labeled sulfur colloid  COMPARISON:  None.  FINDINGS: Expected location of the stomach in the left upper quadrant. Ingested meal empties the stomach gradually over the course of the study with 74% retention at 60 min and 54% retention at 120 min (normal retention less than 30% at a 120 min).  IMPRESSION: Delayed gastric emptying.   Electronically Signed   By: Abelardo Diesel M.D.    On: 05/06/2014 11:14    Scheduled Meds: . acetaminophen  500 mg Oral BID  . antiseptic oral rinse  15 mL Mouth Rinse q12n4p  . chlorhexidine  15 mL Mouth Rinse BID  . dorzolamide  1 drop Both Eyes TID  . heparin  5,000 Units Subcutaneous 3 times per day  . latanoprost  1 drop Both Eyes QHS  . metoCLOPramide (REGLAN) injection  10 mg Intravenous 4 times per day  . neomycin-polymyxin b-dexamethasone  2 drop Both Eyes BID  . sodium chloride  3 mL Intravenous Q12H  . timolol  1 drop Left Eye QHS   Continuous Infusions: . dextrose 5 % and 0.45 % NaCl with KCl 20 mEq/L 100 mL/hr at 05/06/14 0636    Time spent: 35 minutes    [place] [facility] Pager [phone]  If 7PM-7AM, please contact night-coverage at [email or web address], password [name] 05/06/2014, 2:36 PM  LOS: 4 days

## 2014-05-06 NOTE — Progress Notes (Signed)
Pt had a 15 beat run of SVT, nonsustained. Pt asymptomatic and resting comfortably. MD notified and new orders given. Will continue to monitor.

## 2014-05-06 NOTE — Progress Notes (Signed)
[facility] Progress Note  Subjective: Says his nausea and vomiting are doing better, tolerated  the nuclear medicine gastric emptying study without nausea Objective: Vital signs in last 24 hours: Temp:  [97.7 F (36.5 C)-98.8 F (37.1 C)] 98 F (36.7 C) (06/03 1010) Pulse Rate:  [55-70] 60 (06/03 1010) Resp:  [16-20] 18 (06/03 1010) BP: (136-185)/(70-86) 160/80 mmHg (06/03 1010) SpO2:  [96 %-100 %] 100 % (06/03 1010) Weight change:    PE: Unchanged  Lab Results: No results found for this or any previous visit (from the past 24 hour(s)).  Studies/Results: Nm Gastric Emptying  05/06/2014   CLINICAL DATA:  Vomiting  EXAM: NUCLEAR MEDICINE GASTRIC EMPTYING SCAN  TECHNIQUE: After oral ingestion of radiolabeled meal, sequential abdominal images were obtained for 120 minutes. Residual percentage of activity remaining within the stomach was calculated at 60 and 120 minutes.  RADIOPHARMACEUTICALS:  2.2 mCi Technetium 99-m labeled sulfur colloid  COMPARISON:  None.  FINDINGS: Expected location of the stomach in the left upper quadrant. Ingested meal empties the stomach gradually over the course of the study with 74% retention at 60 min and 54% retention at 120 min (normal retention less than 30% at a 120 min).  IMPRESSION: Delayed gastric emptying.   Electronically Signed   By: Abelardo Diesel M.D.   On: 05/06/2014 11:14      Assessment: Nausea vomiting and weight loss, CT scan, EGD and barium swallow all unrevealing with positive gastric emptying study implying gastroparesis do not think this is intestinal ischemia due to the lack of abdominal pain  Plan: Will begin Reglan 10 mg orally 4 times a day to see if this will control symptoms.  Missy Sabins 05/06/2014, 12:27 PM

## 2014-05-06 NOTE — Care Management Note (Addendum)
    Page 1 of 1   05/12/2014     4:02:56 PM CARE MANAGEMENT NOTE 05/12/2014  Patient:  UNICE, ANGIER   Account Number:  [id number]  Date Initiated:  05/06/2014  Documentation initiated by:  [facility]  Subjective/Objective Assessment:   73 Y/O M ADMITTED W/RENAL FAILURE.DYSPHAGIA.     Action/Plan:   FROM HOME.HAS PCP,PHARMACY.   Anticipated DC Date:  05/13/2014   Anticipated DC Plan:  [place]  CM consult      Choice offered to / List presented to:             Status of service:  In process, will continue to follow Medicare Important Message given?  YES (If response is "NO", the following Medicare IM given date fields will be blank) Date Medicare IM given:  05/12/2014 Date Additional Medicare IM given:    Discharge Disposition:    Per UR Regulation:  Reviewed for med. necessity/level of care/duration of stay  If discussed at [place] of Stay Meetings, dates discussed:   05/07/2014  05/12/2014    Comments:  05/11/14 Severn Goddard RN,BSN NCM [phone] GI SIGNED OFF.NEPHROLOGY-STUDIES W/U,?BX.IV REGLAN,IV ERYTHROMYCIN.D/C PLAN HOME.  05/07/14 Maeci Kalbfleisch RN,BSN NCM [phone] GASTROPARESIS.IV REGLAN.NO ANTICIPATED D/C NEEDS.  05/06/14 Madelynn Malson RN,BSN NCM [phone] 4:30P-RETURNED TO PATIENT'S RM TO CLARIFY-MEDICARE IM NOTICE-PATIENT VOICED UNDERSTANDING. GASTRIC EMPTYING-DELAYED GASTRIC EMPTHYING.GI-IV REGLAN.NO ANTICIPATED D/C NEEDS.

## 2014-05-06 NOTE — Progress Notes (Signed)
Physical Therapy Treatment Patient Details Name: David [name] MRN: [id number] DOB: [birth year] Today's Date: 05/06/2014    History of Present Illness 73 y.o. male admitted 5/30 with significant dehydration due to unable to eat food and weight loss with PMHx of CHF, HTN, prostate cancer.    PT Comments    Assisted pt OOB to amb to BR.  Assisted in BR with hygiene.  Amb in hallway holding to IV pole.   Follow Up Recommendations  No PT follow up     Equipment Recommendations  Rolling walker with 5" wheels    Recommendations for Other Services       Precautions / Restrictions      Mobility  Bed Mobility Overal bed mobility: Modified Independent                Transfers Overall transfer level: Needs assistance Equipment used: None Transfers: Sit to/from Stand Sit to Stand: Min guard;Min assist         General transfer comment: verbal cues for hand placement and safety with turns.  Ambulation/Gait Ambulation/Gait assistance: Min guard;Min assist Ambulation Distance (Feet): 185 Feet Assistive device: None (IV pole ) Gait Pattern/deviations: Step-through pattern Gait velocity: decr   General Gait Details: mild unsteady gait def holding to IV pole.   Stairs            Wheelchair Mobility    Modified Rankin (Stroke Patients Only)       Balance                                    Cognition                            Exercises      General Comments        Pertinent Vitals/Pain     Home Living                      Prior Function            PT Goals (current goals can now be found in the care plan section) Progress towards PT goals: Progressing toward goals    Frequency  Min 3X/week    PT Plan      Co-evaluation             End of Session Equipment Utilized During Treatment: Gait belt Activity Tolerance: Patient limited by fatigue Patient left: in bed;with call bell/phone within  reach     Time: 1400-1425 PT Time Calculation (min): 25 min  Charges:  $Gait Training: 8-22 mins $Therapeutic Activity: 8-22 mins                    G Codes:      Rica Koyanagi  PTA [facility]  Acute  Rehab Pager      [phone]

## 2014-05-07 ENCOUNTER — Encounter (HOSPITAL_COMMUNITY): Payer: Self-pay | Admitting: Gastroenterology

## 2014-05-07 LAB — CBC
HEMATOCRIT: 30.8 % — AB (ref 39.0–52.0)
HEMOGLOBIN: 10 g/dL — AB (ref 13.0–17.0)
MCH: 29.1 pg (ref 26.0–34.0)
MCHC: 32.5 g/dL (ref 30.0–36.0)
MCV: 89.5 fL (ref 78.0–100.0)
Platelets: 140 10*3/uL — ABNORMAL LOW (ref 150–400)
RBC: 3.44 MIL/uL — ABNORMAL LOW (ref 4.22–5.81)
RDW: 13.1 % (ref 11.5–15.5)
WBC: 3.6 10*3/uL — AB (ref 4.0–10.5)

## 2014-05-07 LAB — BASIC METABOLIC PANEL
BUN: 29 mg/dL — AB (ref 6–23)
CHLORIDE: 110 meq/L (ref 96–112)
CO2: 19 meq/L (ref 19–32)
Calcium: 8.2 mg/dL — ABNORMAL LOW (ref 8.4–10.5)
Creatinine, Ser: 3.83 mg/dL — ABNORMAL HIGH (ref 0.50–1.35)
GFR calc Af Amer: 17 mL/min — ABNORMAL LOW (ref >90)
GFR calc non Af Amer: 15 mL/min — ABNORMAL LOW (ref >90)
Glucose, Bld: 102 mg/dL — ABNORMAL HIGH (ref 70–99)
POTASSIUM: 5.2 meq/L (ref 3.7–5.3)
Sodium: 137 mEq/L (ref 137–147)

## 2014-05-07 MED ORDER — BOOST / RESOURCE BREEZE PO LIQD
1.0000 | Freq: Two times a day (BID) | ORAL | Status: DC
  Administered 2014-05-07: 1 via ORAL

## 2014-05-07 NOTE — Progress Notes (Signed)
NUTRITION FOLLOW UP  Intervention:   -Recommend Resource Breeze po BID, each supplement provides 250 kcal and 9 grams of protein -Recommend diet liberalized to Regular to encourage PO intake -Will continue to monitor  Nutrition Dx:   Inadequate oral intake related to inability to eat as evidenced by NPO status-improving with diet advancement    Goal:   Pt to meet >/= 90% of their estimated nutrition needs    Monitor:   Diet order, total protein/energy intake, GI profile  Assessment:    -Pt endorsed 22 lbs wt loss in past 4 months  -Diet recall indicates pt consuming softer foods and supplements. Would tolerate for 1-2 hours; however would then result in emesis. Tried Ensure supplements; however did not like taste and resulted in vomiting. Was unable to try blander/starchy foods (breads/crackers) as they got caught in throat. Was able to consume some Gatorade for electrolytes  -MD noted pt with AKI, baseline Crt 2.0. Currently 4.81. Receiving IVF  -NPO for MBS to occur today (6/01)  -Pt willing to try MagicCup supplements as diet advancement tolerated  -Lipase elevated, MD noted possible for pancreatitis  6/04: -Pt's diet advanced to clear liquid on 6/02 and advanced to heart healthy on 6/03 -MD noted pt with gastric emptying, has been started on Reglan and experienced improvement in n/v -EGD and BS WNL -Pt reported some improvement in appetite, is eating 50% of meals. Noted loss of taste, and disinterest in foods available on current diet order. May benefit from liberalizing diet to encourage PO intake d/t hx of weight loss/poor appetite pta  Height: Ht Readings from Last 1 Encounters:  05/02/14 6\' 2"  (1.88 m)    Weight Status:   Wt Readings from Last 1 Encounters:  05/02/14 194 lb 4.8 oz (88.134 kg)    Re-estimated needs:  Kcal: 2200-2400  Protein: 90-105 gram  Fluid: >/=2200 ml/daily     Skin: WDL  Diet Order: Cardiac   Intake/Output Summary (Last 24 hours) at  05/07/14 1654 Last data filed at 05/07/14 1446  Gross per 24 hour  Intake   1840 ml  Output   1115 ml  Net    725 ml    Last BM: 6/03   Labs:   Recent Labs Lab 05/04/14 0500 05/05/14 0439 05/06/14 2147 05/07/14 0515  NA 143 140  --  137  K 4.5 4.7  --  5.2  CL 114* 111  --  110  CO2 19 20  --  19  BUN 56* 44*  --  29*  CREATININE 4.81* 4.22*  --  3.83*  CALCIUM 8.4 8.3*  --  8.2*  MG  --   --  1.7  --   GLUCOSE 115* 92  --  102*    CBG (last 3)  No results found for this basename: GLUCAP,  in the last 72 hours  Scheduled Meds: . acetaminophen  500 mg Oral BID  . antiseptic oral rinse  15 mL Mouth Rinse q12n4p  . chlorhexidine  15 mL Mouth Rinse BID  . dorzolamide  1 drop Both Eyes TID  . feeding supplement (RESOURCE BREEZE)  1 Container Oral BID BM  . heparin  5,000 Units Subcutaneous 3 times per day  . latanoprost  1 drop Both Eyes QHS  . metoCLOPramide (REGLAN) injection  10 mg Intravenous 4 times per day  . neomycin-polymyxin b-dexamethasone  2 drop Both Eyes BID  . sodium chloride  3 mL Intravenous Q12H  . timolol  1 drop  Left Eye QHS    Continuous Infusions: . dextrose 5 % and 0.45 % NaCl with KCl 20 mEq/L 100 mL/hr at 05/07/14 [place] LDN Clinical Dietitian [id number]

## 2014-05-07 NOTE — Progress Notes (Signed)
[facility] PROGRESS NOTE  JANETTE DOUROS [id number] DOB: 20-Sep-1941 DOA: 05/02/2014 PCP: [name], NP  Brief narrative: 73 year old with history of hypertension, prostate adenocarcinoma Gleason grade 3  Presenting with vomiting, regurgitation, reports of dysphasia and reports of weight loss currently undergoing GI workup. 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   Assessment/Plan:    Loss of weight/dysphagia - GI on board. Appreciate their recommendations. - Barium swallow/EGD WNL. -Gastric emptying scan with signs of delayed gastric emptying; ?etiology (not a diabetic). -Much improved this am with reglan: no n/v and was able to tolerate breakfast without incident.    Acute on CKD Stage IV -Baseline Cr is 1.9 from 07/2013. -Improving on IVF. -Suspect a component of prerenal azotemia given poor oral intake and GI losses). -Continue to monitor renal function daily.    Hypotension - Most likely due to poor oral intake and dehydration.  - Has resolved with IV fluids.  Code Status: Full Family Communication: Discussed directly with patient Disposition Plan:Home when ready; likely 24-48 hours.   Consultants:  GI: Dr. Amedeo Plenty  Procedures:  Barium swallow  EGD  GES  Antibiotics:  None  HPI/Subjective: Patient has no new complaints.  Objective: Filed Vitals:   05/07/14 0525  BP: 162/85  Pulse: 65  Temp: 98.5 F (36.9 C)  Resp: 20    Intake/Output Summary (Last 24 hours) at 05/07/14 1030 Last data filed at 05/07/14 1004  Gross per 24 hour  Intake   2860 ml  Output    715 ml  Net   2145 ml   Filed Weights   05/02/14 1643  Weight: 88.134 kg (194 lb 4.8 oz)    Exam:   General:  Patient in no acute distress, alert and awake  Cardiovascular: Regular rate and rhythm, no murmurs or rubs  Respiratory: Clear to auscultation bilaterally no wheezes  Abdomen: Soft, nondistended, nontender  Musculoskeletal: No cyanosis or  clubbing   Data Reviewed: Basic Metabolic Panel:  Recent Labs Lab 05/02/14 1446 05/03/14 0747 05/04/14 0500 05/05/14 0439 05/06/14 2147 05/07/14 0515  NA 138 140 143 140  --  137  K 4.7 4.9 4.5 4.7  --  5.2  CL 103 108 114* 111  --  110  CO2 21 21 19 20   --  19  GLUCOSE 111* 107* 115* 92  --  102*  BUN 73* 66* 56* 44*  --  29*  CREATININE 5.85* 5.34* 4.81* 4.22*  --  3.83*  CALCIUM 8.5 8.3* 8.4 8.3*  --  8.2*  MG  --   --   --   --  1.7  --    Liver Function Tests:  Recent Labs Lab 05/02/14 1446  AST 18  ALT 21  ALKPHOS 81  BILITOT 0.3  PROT 6.9  ALBUMIN 3.0*    Recent Labs Lab 05/02/14 1446 05/03/14 0747 05/04/14 0500  LIPASE 113* 100* 128*   No results found for this basename: AMMONIA,  in the last 168 hours CBC:  Recent Labs Lab 05/02/14 1446 05/03/14 0747 05/07/14 0515  WBC 3.0* 7.5 3.6*  NEUTROABS 1.5*  --   --   HGB 10.3* 9.9* 10.0*  HCT 31.1* 30.1* 30.8*  MCV 90.1 90.1 89.5  PLT 129* 131* 140*   Cardiac Enzymes: No results found for this basename: CKTOTAL, CKMB, CKMBINDEX, TROPONINI,  in the last 168 hours BNP (last 3 results) No results found for this basename: PROBNP,  in the last 8760 hours CBG: No results found  for this basename: GLUCAP,  in the last 168 hours  Recent Results (from the past 240 hour(s))  URINE CULTURE     Status: None   Collection Time    04/29/14  8:58 AM      Result Value Ref Range Status   Colony Count NO GROWTH   Final   Organism ID, Bacteria NO GROWTH   Final     Studies: Nm Gastric Emptying  05/06/2014   CLINICAL DATA:  Vomiting  EXAM: NUCLEAR MEDICINE GASTRIC EMPTYING SCAN  TECHNIQUE: After oral ingestion of radiolabeled meal, sequential abdominal images were obtained for 120 minutes. Residual percentage of activity remaining within the stomach was calculated at 60 and 120 minutes.  RADIOPHARMACEUTICALS:  2.2 mCi Technetium 99-m labeled sulfur colloid  COMPARISON:  None.  FINDINGS: Expected location of the  stomach in the left upper quadrant. Ingested meal empties the stomach gradually over the course of the study with 74% retention at 60 min and 54% retention at 120 min (normal retention less than 30% at a 120 min).  IMPRESSION: Delayed gastric emptying.   Electronically Signed   By: Abelardo Diesel M.D.   On: 05/06/2014 11:14    Scheduled Meds: . acetaminophen  500 mg Oral BID  . antiseptic oral rinse  15 mL Mouth Rinse q12n4p  . chlorhexidine  15 mL Mouth Rinse BID  . dorzolamide  1 drop Both Eyes TID  . heparin  5,000 Units Subcutaneous 3 times per day  . latanoprost  1 drop Both Eyes QHS  . metoCLOPramide (REGLAN) injection  10 mg Intravenous 4 times per day  . neomycin-polymyxin b-dexamethasone  2 drop Both Eyes BID  . sodium chloride  3 mL Intravenous Q12H  . timolol  1 drop Left Eye QHS   Continuous Infusions: . dextrose 5 % and 0.45 % NaCl with KCl 20 mEq/L 100 mL/hr at 05/07/14 0143    Time spent: 35 minutes    [place] [facility] Pager [phone]  If 7PM-7AM, please contact night-coverage at [email or web address], password [facility] 05/07/2014, 10:30 AM  LOS: 5 days

## 2014-05-08 LAB — BASIC METABOLIC PANEL
BUN: 26 mg/dL — AB (ref 6–23)
CO2: 17 mEq/L — ABNORMAL LOW (ref 19–32)
Calcium: 8.3 mg/dL — ABNORMAL LOW (ref 8.4–10.5)
Chloride: 107 mEq/L (ref 96–112)
Creatinine, Ser: 3.65 mg/dL — ABNORMAL HIGH (ref 0.50–1.35)
GFR calc Af Amer: 18 mL/min — ABNORMAL LOW (ref >90)
GFR, EST NON AFRICAN AMERICAN: 15 mL/min — AB (ref >90)
GLUCOSE: 98 mg/dL (ref 70–99)
POTASSIUM: 5.2 meq/L (ref 3.7–5.3)
Sodium: 136 mEq/L — ABNORMAL LOW (ref 137–147)

## 2014-05-08 MED ORDER — PANTOPRAZOLE SODIUM 40 MG PO TBEC
40.0000 mg | DELAYED_RELEASE_TABLET | Freq: Every day | ORAL | Status: DC
  Administered 2014-05-08 – 2014-05-15 (×8): 40 mg via ORAL
  Filled 2014-05-08 (×9): qty 1

## 2014-05-08 MED ORDER — SODIUM CHLORIDE 0.9 % IV SOLN
100.0000 mg | Freq: Three times a day (TID) | INTRAVENOUS | Status: DC
Start: 2014-05-08 — End: 2014-05-15
  Administered 2014-05-08 – 2014-05-15 (×22): 100 mg via INTRAVENOUS
  Filled 2014-05-08 (×25): qty 2

## 2014-05-08 NOTE — Progress Notes (Signed)
[facility] PROGRESS NOTE  David [name] [id number] DOB: 07/13/1941 DOA: 05/02/2014 PCP: [name], NP  Brief narrative: 73 year old with history of hypertension, prostate adenocarcinoma Gleason grade 3  Presenting with vomiting, regurgitation, reports of dysphasia and reports of weight loss currently undergoing GI workup. 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   Assessment/Plan:    Loss of weight/dysphagia - GI on board. Appreciate their recommendations. - Barium swallow/EGD WNL. -Gastric emptying scan with signs of delayed gastric emptying; ?etiology (not a diabetic). -Recurrent symptoms this am. -Will start erythromycin to see if we can "jump-start" his gastric motility. -Continue reglan/PPI.    Acute on CKD Stage IV -Baseline Cr is 1.9 from 07/2013. -Improving on IVF. -Suspect a component of prerenal azotemia given poor oral intake and GI losses). -Continue to monitor renal function daily.    Hypotension - Most likely due to poor oral intake and dehydration.  - Has resolved with IV fluids.  Code Status: Full Family Communication: Discussed directly with patient Disposition Plan:Home when ready; hopefully in  24-48 hours.   Consultants:  GI: Dr. Amedeo Plenty  Procedures:  Barium swallow  EGD  GES  Antibiotics:  None  HPI/Subjective: Patient has no new complaints.  Objective: Filed Vitals:   05/08/14 0439  BP: 161/82  Pulse: 74  Temp: 98.7 F (37.1 C)  Resp: 19    Intake/Output Summary (Last 24 hours) at 05/08/14 1032 Last data filed at 05/08/14 0900  Gross per 24 hour  Intake 3088.33 ml  Output   1550 ml  Net 1538.33 ml   Filed Weights   05/02/14 1643  Weight: 88.134 kg (194 lb 4.8 oz)    Exam:   General:  Patient in no acute distress, alert and awake  Cardiovascular: Regular rate and rhythm, no murmurs or rubs  Respiratory: Clear to auscultation bilaterally no wheezes  Abdomen: Soft, nondistended,  nontender  Musculoskeletal: No cyanosis or clubbing   Data Reviewed: Basic Metabolic Panel:  Recent Labs Lab 05/03/14 0747 05/04/14 0500 05/05/14 0439 05/06/14 2147 05/07/14 0515 05/08/14 0408  NA 140 143 140  --  137 136*  K 4.9 4.5 4.7  --  5.2 5.2  CL 108 114* 111  --  110 107  CO2 21 19 20   --  19 17*  GLUCOSE 107* 115* 92  --  102* 98  BUN 66* 56* 44*  --  29* 26*  CREATININE 5.34* 4.81* 4.22*  --  3.83* 3.65*  CALCIUM 8.3* 8.4 8.3*  --  8.2* 8.3*  MG  --   --   --  1.7  --   --    Liver Function Tests:  Recent Labs Lab 05/02/14 1446  AST 18  ALT 21  ALKPHOS 81  BILITOT 0.3  PROT 6.9  ALBUMIN 3.0*    Recent Labs Lab 05/02/14 1446 05/03/14 0747 05/04/14 0500  LIPASE 113* 100* 128*   No results found for this basename: AMMONIA,  in the last 168 hours CBC:  Recent Labs Lab 05/02/14 1446 05/03/14 0747 05/07/14 0515  WBC 3.0* 7.5 3.6*  NEUTROABS 1.5*  --   --   HGB 10.3* 9.9* 10.0*  HCT 31.1* 30.1* 30.8*  MCV 90.1 90.1 89.5  PLT 129* 131* 140*   Cardiac Enzymes: No results found for this basename: CKTOTAL, CKMB, CKMBINDEX, TROPONINI,  in the last 168 hours BNP (last 3 results) No results found for this basename: PROBNP,  in the last 8760 hours CBG: No results found for  this basename: GLUCAP,  in the last 168 hours  Recent Results (from the past 240 hour(s))  URINE CULTURE     Status: None   Collection Time    04/29/14  8:58 AM      Result Value Ref Range Status   Colony Count NO GROWTH   Final   Organism ID, Bacteria NO GROWTH   Final     Studies: No results found.  Scheduled Meds: . acetaminophen  500 mg Oral BID  . antiseptic oral rinse  15 mL Mouth Rinse q12n4p  . chlorhexidine  15 mL Mouth Rinse BID  . dorzolamide  1 drop Both Eyes TID  . erythromycin  100 mg Intravenous 3 times per day  . feeding supplement (RESOURCE BREEZE)  1 Container Oral BID BM  . heparin  5,000 Units Subcutaneous 3 times per day  . latanoprost  1 drop  Both Eyes QHS  . metoCLOPramide (REGLAN) injection  10 mg Intravenous 4 times per day  . neomycin-polymyxin b-dexamethasone  2 drop Both Eyes BID  . pantoprazole  40 mg Oral Daily  . sodium chloride  3 mL Intravenous Q12H  . timolol  1 drop Left Eye QHS   Continuous Infusions: . dextrose 5 % and 0.45 % NaCl with KCl 20 mEq/L 100 mL/hr at 05/08/14 0944    Time spent: 35 minutes    [place] [facility] Pager [phone]  If 7PM-7AM, please contact night-coverage at [email or web address], password [facility] 05/08/2014, 10:32 AM  LOS: 6 days

## 2014-05-08 NOTE — Progress Notes (Signed)
[facility] Progress Note  Subjective: Tolerated diet well yesterday, experienced what he calls "gagging', with a bitter, regurgitant taste while consuming oatmeal this morning denies any dysphagia nausea or abdominal pain Objective: Vital signs in last 24 hours: Temp:  [98.1 F (36.7 C)-98.7 F (37.1 C)] 98.7 F (37.1 C) (06/05 0439) Pulse Rate:  [65-74] 74 (06/05 0439) Resp:  [19-20] 19 (06/05 0439) BP: (161-177)/(80-82) 161/82 mmHg (06/05 0439) SpO2:  [97 %-100 %] 97 % (06/05 0439) Weight change:    PE: Unchanged, abdomen soft  Lab Results: Results for orders placed during the hospital encounter of 05/02/14 (from the past 24 hour(s))  BASIC METABOLIC PANEL     Status: Abnormal   Collection Time    05/08/14  4:08 AM      Result Value Ref Range   Sodium 136 (*) 137 - 147 mEq/L   Potassium 5.2  3.7 - 5.3 mEq/L   Chloride 107  96 - 112 mEq/L   CO2 17 (*) 19 - 32 mEq/L   Glucose, Bld 98  70 - 99 mg/dL   BUN 26 (*) 6 - 23 mg/dL   Creatinine, Ser 3.65 (*) 0.50 - 1.35 mg/dL   Calcium 8.3 (*) 8.4 - 10.5 mg/dL   GFR calc non Af Amer 15 (*) >90 mL/min   GFR calc Af Amer 18 (*) >90 mL/min    Studies/Results: Nm Gastric Emptying  05/06/2014   CLINICAL DATA:  Vomiting  EXAM: NUCLEAR MEDICINE GASTRIC EMPTYING SCAN  TECHNIQUE: After oral ingestion of radiolabeled meal, sequential abdominal images were obtained for 120 minutes. Residual percentage of activity remaining within the stomach was calculated at 60 and 120 minutes.  RADIOPHARMACEUTICALS:  2.2 mCi Technetium 99-m labeled sulfur colloid  COMPARISON:  None.  FINDINGS: Expected location of the stomach in the left upper quadrant. Ingested meal empties the stomach gradually over the course of the study with 74% retention at 60 min and 54% retention at 120 min (normal retention less than 30% at a 120 min).  IMPRESSION: Delayed gastric emptying.   Electronically Signed   By: Abelardo Diesel M.D.   On: 05/06/2014 11:14       Assessment: Variously reported dysphagia, regurgitation and intolerance of food: somewhat puzzling picture with only significant upper GI study being an abnormal gastric emptying scan. EGD and barium swallow negative. CT scan unrevealing, colonoscopy done 3 years ago. Plan: Will continue by mouth Reglan and add a proton pump inhibitor for possibility of a reflux component. Nutrition recommendations appreciated   Missy Sabins 05/08/2014, 9:25 AM

## 2014-05-08 NOTE — Progress Notes (Signed)
Physical Therapy Treatment Patient Details Name: David [name] MRN: [id number] DOB: [birth year] Today's Date: 05/08/2014    History of Present Illness 73 y.o. male admitted 5/30 with significant dehydration due to unable to eat food and weight loss with PMHx of CHF, HTN, prostate cancer.    PT Comments    **Pt progressing well with mobility, he walked 280' with RW without LOB. OK to DC home from PT standpoint. *  Follow Up Recommendations  No PT follow up     Equipment Recommendations  Rolling walker with 5" wheels    Recommendations for Other Services       Precautions / Restrictions Precautions Precautions: None Restrictions Weight Bearing Restrictions: No    Mobility  Bed Mobility                  Transfers Overall transfer level: Modified independent Equipment used: None;Rolling walker (2 wheeled) Transfers: Sit to/from Stand Sit to Stand: Modified independent (Device/Increase time)            Ambulation/Gait Ambulation/Gait assistance: Modified independent (Device/Increase time) Ambulation Distance (Feet): 280 Feet Assistive device: Rolling walker (2 wheeled) (IV pole ) Gait Pattern/deviations: Decreased step length - right Gait velocity: decr   General Gait Details: no LOB, steady with RW, HR 88 with walking   Stairs            Wheelchair Mobility    Modified Rankin (Stroke Patients Only)       Balance                                    Cognition Arousal/Alertness: Awake/alert Behavior During Therapy: WFL for tasks assessed/performed Overall Cognitive Status: Within Functional Limits for tasks assessed                      Exercises General Exercises - Lower Extremity Ankle Circles/Pumps: AROM;Both;10 reps;Supine Heel Slides: AAROM;Both;10 reps;Supine Hip ABduction/ADduction: AAROM;Both;10 reps;Supine    General Comments        Pertinent Vitals/Pain *0/j10 pain HR 80 at rest, 88 with walking**     Home Living                      Prior Function            PT Goals (current goals can now be found in the care plan section) Acute Rehab PT Goals PT Goal Formulation: With patient Time For Goal Achievement: 05/11/14 Potential to Achieve Goals: Good Progress towards PT goals: Progressing toward goals    Frequency  Min 3X/week    PT Plan Current plan remains appropriate    Co-evaluation             End of Session Equipment Utilized During Treatment: Gait belt Activity Tolerance: Patient limited by fatigue Patient left: with call bell/phone within reach;in chair     Time: [id number] PT Time Calculation (min): 23 min  Charges:  $Gait Training: 8-22 mins $Therapeutic Exercise: 8-22 mins                    G Codes:      Lucile Crater 05/08/2014, 9:43 AM [phone]

## 2014-05-08 NOTE — Progress Notes (Signed)
NUTRITION FOLLOW UP  Intervention:   -Encouraged increased meal intake with selection of bland foods  -D/C Resource Breeze -Will continue to monitor  Nutrition Dx:   Inadequate oral intake related to inability to eat as evidenced by NPO status-ongoing now related to poor appetite and vomiting    Goal:   Pt to meet >/= 90% of their estimated nutrition needs - not met   Monitor:   Weights, labs, intake, vomiting   Assessment:    -Pt endorsed 22 lbs wt loss in past 4 months  -Diet recall indicates pt consuming softer foods and supplements. Would tolerate for 1-2 hours; however would then result in emesis. Tried Ensure supplements; however did not like taste and resulted in vomiting. Was unable to try blander/starchy foods (breads/crackers) as they got caught in throat. Was able to consume some Gatorade for electrolytes  -MD noted pt with AKI, baseline Crt 2.0. Currently 4.81. Receiving IVF  -NPO for MBS to occur today (6/01)  -Pt willing to try MagicC[employer] supplements as diet advancement tolerated  -Lipase elevated, MD noted possible for pancreatitis  6/04: -Pt's diet advanced to clear liquid on 6/02 and advanced to heart healthy on 6/03 -MD noted pt with gastric emptying, has been started on Reglan and experienced improvement in n/v -EGD and BS WNL -Pt reported some improvement in appetite, is eating 50% of meals. Noted loss of taste, and disinterest in foods available on current diet order. May benefit from liberalizing diet to encourage PO intake d/t hx of weight loss/poor appetite pta  6/05: -Pt reports not eating much yesterday -Had vomiting this morning after eating oatmeal, didn't eat anything for lunch, only had some orange juice -Not interested in any nutritional supplements or food at this time, only requests orange juice which was provided by RD -GI added proton pump inhibitor this morning for possibility of reflux component  -MD added erythromycin to see if we can  "jump-start" his gastric motility -Per RN, pt not drinking Resource Breeze because pt c/o of it tasting too sweet, will d/c     Height: Ht Readings from Last 1 Encounters:  05/02/14 6' 2" (1.88 m)    Weight Status:   Wt Readings from Last 1 Encounters:  05/02/14 194 lb 4.8 oz (88.134 kg)    Re-estimated needs:  Kcal: 2200-2400  Protein: 90-105 gram  Fluid: >/=2200 ml/daily     Skin: WDL  Diet Order: Cardiac   Intake/Output Summary (Last 24 hours) at 05/08/14 1404 Last data filed at 05/08/14 0900  Gross per 24 hour  Intake 3088.33 ml  Output   1350 ml  Net 1738.33 ml    Last BM: 6/04   Labs:   Recent Labs Lab 05/05/14 0439 05/06/14 2147 05/07/14 0515 05/08/14 0408  NA 140  --  137 136*  K 4.7  --  5.2 5.2  CL 111  --  110 107  CO2 20  --  19 17*  BUN 44*  --  29* 26*  CREATININE 4.22*  --  3.83* 3.65*  CALCIUM 8.3*  --  8.2* 8.3*  MG  --  1.7  --   --   GLUCOSE 92  --  102* 98    CBG (last 3)  No results found for this basename: GLUCAP,  in the last 72 hours  Scheduled Meds: . acetaminophen  500 mg Oral BID  . antiseptic oral rinse  15 mL Mouth Rinse q12n4p  . chlorhexidine  15 mL Mouth Rinse BID  .  dorzolamide  1 drop Both Eyes TID  . erythromycin  100 mg Intravenous 3 times per day  . feeding supplement (RESOURCE BREEZE)  1 Container Oral BID BM  . heparin  5,000 Units Subcutaneous 3 times per day  . latanoprost  1 drop Both Eyes QHS  . metoCLOPramide (REGLAN) injection  10 mg Intravenous 4 times per day  . neomycin-polymyxin b-dexamethasone  2 drop Both Eyes BID  . pantoprazole  40 mg Oral Daily  . sodium chloride  3 mL Intravenous Q12H  . timolol  1 drop Left Eye QHS    Continuous Infusions: . dextrose 5 % and 0.45 % NaCl with KCl 20 mEq/L 100 mL/hr at 05/08/14 [place] MS, RD, LDN [phone] Pager [phone] Weekend/After Hours Pager

## 2014-05-09 LAB — BASIC METABOLIC PANEL
BUN: 24 mg/dL — ABNORMAL HIGH (ref 6–23)
CHLORIDE: 110 meq/L (ref 96–112)
CO2: 18 meq/L — AB (ref 19–32)
Calcium: 8.1 mg/dL — ABNORMAL LOW (ref 8.4–10.5)
Creatinine, Ser: 3.72 mg/dL — ABNORMAL HIGH (ref 0.50–1.35)
GFR calc Af Amer: 17 mL/min — ABNORMAL LOW (ref >90)
GFR calc non Af Amer: 15 mL/min — ABNORMAL LOW (ref >90)
Glucose, Bld: 97 mg/dL (ref 70–99)
POTASSIUM: 5.7 meq/L — AB (ref 3.7–5.3)
Sodium: 137 mEq/L (ref 137–147)

## 2014-05-09 NOTE — Progress Notes (Signed)
[facility] PROGRESS NOTE  David [name] [id number] DOB: [birth year] DOA: 05/02/2014 PCP: [name], NP  Brief narrative: 73 year old with history of hypertension, prostate adenocarcinoma Gleason grade 3  Presenting with vomiting, regurgitation, reports of dysphasia and reports of weight loss currently undergoing GI workup. 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   Assessment/Plan:    Loss of weight/dysphagia - GI on board. Appreciate their recommendations. - Barium swallow/EGD WNL. -Gastric emptying scan with signs of delayed gastric emptying; ?etiology (not a diabetic). -Recurrent symptoms this am. -Will start erythromycin to see if we can "jump-start" his gastric motility. -Continue reglan/PPI.    Acute on CKD Stage IV -Baseline Cr is 1.9 from 07/2013. -Improving on IVF. -Suspect a component of prerenal azotemia given poor oral intake and GI losses). -Continue to monitor renal function daily.    Hypotension - Most likely due to poor oral intake and dehydration.  - Has resolved with IV fluids.  Code Status: Full Family Communication: Discussed directly with patient Disposition Plan:Home when ready.   Consultants:  GI: Dr. Amedeo Plenty  Procedures:  Barium swallow  EGD  GES  Antibiotics:  None  HPI/Subjective: Patient has no new complaints. Still with emesis and regurgitation.  Objective: Filed Vitals:   05/09/14 0530  BP: 148/73  Pulse: 65  Temp: 98.7 F (37.1 C)  Resp: 18    Intake/Output Summary (Last 24 hours) at 05/09/14 1131 Last data filed at 05/09/14 0913  Gross per 24 hour  Intake 3471.67 ml  Output   2025 ml  Net 1446.67 ml   Filed Weights   05/02/14 1643  Weight: 88.134 kg (194 lb 4.8 oz)    Exam:   General:  Patient in no acute distress, alert and awake  Cardiovascular: Regular rate and rhythm, no murmurs or rubs  Respiratory: Clear to auscultation bilaterally no wheezes  Abdomen: Soft,  nondistended, nontender  Musculoskeletal: No cyanosis or clubbing   Data Reviewed: Basic Metabolic Panel:  Recent Labs Lab 05/04/14 0500 05/05/14 0439 05/06/14 2147 05/07/14 0515 05/08/14 0408 05/09/14 0540  NA 143 140  --  137 136* 137  K 4.5 4.7  --  5.2 5.2 5.7*  CL 114* 111  --  110 107 110  CO2 19 20  --  19 17* 18*  GLUCOSE 115* 92  --  102* 98 97  BUN 56* 44*  --  29* 26* 24*  CREATININE 4.81* 4.22*  --  3.83* 3.65* 3.72*  CALCIUM 8.4 8.3*  --  8.2* 8.3* 8.1*  MG  --   --  1.7  --   --   --    Liver Function Tests:  Recent Labs Lab 05/02/14 1446  AST 18  ALT 21  ALKPHOS 81  BILITOT 0.3  PROT 6.9  ALBUMIN 3.0*    Recent Labs Lab 05/02/14 1446 05/03/14 0747 05/04/14 0500  LIPASE 113* 100* 128*   No results found for this basename: AMMONIA,  in the last 168 hours CBC:  Recent Labs Lab 05/02/14 1446 05/03/14 0747 05/07/14 0515  WBC 3.0* 7.5 3.6*  NEUTROABS 1.5*  --   --   HGB 10.3* 9.9* 10.0*  HCT 31.1* 30.1* 30.8*  MCV 90.1 90.1 89.5  PLT 129* 131* 140*   Cardiac Enzymes: No results found for this basename: CKTOTAL, CKMB, CKMBINDEX, TROPONINI,  in the last 168 hours BNP (last 3 results) No results found for this basename: PROBNP,  in the last 8760 hours CBG: No results found for  this basename: GLUCAP,  in the last 168 hours  No results found for this or any previous visit (from the past 240 hour(s)).   Studies: No results found.  Scheduled Meds: . acetaminophen  500 mg Oral BID  . dorzolamide  1 drop Both Eyes TID  . erythromycin  100 mg Intravenous 3 times per day  . heparin  5,000 Units Subcutaneous 3 times per day  . latanoprost  1 drop Both Eyes QHS  . metoCLOPramide (REGLAN) injection  10 mg Intravenous 4 times per day  . neomycin-polymyxin b-dexamethasone  2 drop Both Eyes BID  . pantoprazole  40 mg Oral Daily  . sodium chloride  3 mL Intravenous Q12H  . timolol  1 drop Left Eye QHS   Continuous Infusions: . dextrose 5 %  and 0.45 % NaCl with KCl 20 mEq/L 100 mL/hr at 05/09/14 0913    Time spent: 35 minutes    [facility] Pager [phone]  If 7PM-7AM, please contact night-coverage at [email or web address], password [facility] 05/09/2014, 11:31 AM  LOS: 7 days

## 2014-05-09 NOTE — Progress Notes (Signed)
[facility] Progress Note  Subjective: The patient is being treated at this time with Reglan for gastroparesis. He states he was able he been her last night but vomited this morning before breakfast  Objective: Vital signs in last 24 hours: Temp:  [98.1 F (36.7 C)-98.7 F (37.1 C)] 98.7 F (37.1 C) (06/06 0530) Pulse Rate:  [65-70] 65 (06/06 0530) Resp:  [18-20] 18 (06/06 0530) BP: (138-148)/(70-73) 148/73 mmHg (06/06 0530) SpO2:  [98 %-99 %] 99 % (06/06 0530) Weight change:    PE  He is in no distress  Abdomen is soft and nontender  Lab Results: Results for orders placed during the hospital encounter of 05/02/14 (from the past 24 hour(s))  BASIC METABOLIC PANEL     Status: Abnormal   Collection Time    05/09/14  5:40 AM      Result Value Ref Range   Sodium 137  137 - 147 mEq/L   Potassium 5.7 (*) 3.7 - 5.3 mEq/L   Chloride 110  96 - 112 mEq/L   CO2 18 (*) 19 - 32 mEq/L   Glucose, Bld 97  70 - 99 mg/dL   BUN 24 (*) 6 - 23 mg/dL   Creatinine, Ser 3.72 (*) 0.50 - 1.35 mg/dL   Calcium 8.1 (*) 8.4 - 10.5 mg/dL   GFR calc non Af Amer 15 (*) >90 mL/min   GFR calc Af Amer 17 (*) >90 mL/min    Studies/Results: No results found.    Assessment: gastroparesis  Plan: Continue Reglan and observe his response    Wonda Horner 05/09/2014, 1:32 PM

## 2014-05-10 ENCOUNTER — Inpatient Hospital Stay (HOSPITAL_COMMUNITY): Payer: Medicare HMO

## 2014-05-10 LAB — BASIC METABOLIC PANEL
BUN: 23 mg/dL (ref 6–23)
CHLORIDE: 108 meq/L (ref 96–112)
CO2: 18 meq/L — AB (ref 19–32)
Calcium: 8.5 mg/dL (ref 8.4–10.5)
Creatinine, Ser: 3.98 mg/dL — ABNORMAL HIGH (ref 0.50–1.35)
GFR calc Af Amer: 16 mL/min — ABNORMAL LOW (ref >90)
GFR calc non Af Amer: 14 mL/min — ABNORMAL LOW (ref >90)
GLUCOSE: 92 mg/dL (ref 70–99)
Potassium: 6 mEq/L — ABNORMAL HIGH (ref 3.7–5.3)
SODIUM: 136 meq/L — AB (ref 137–147)

## 2014-05-10 LAB — CBC
HEMATOCRIT: 27.9 % — AB (ref 39.0–52.0)
HEMOGLOBIN: 9 g/dL — AB (ref 13.0–17.0)
MCH: 29.1 pg (ref 26.0–34.0)
MCHC: 32.3 g/dL (ref 30.0–36.0)
MCV: 90.3 fL (ref 78.0–100.0)
Platelets: 134 10*3/uL — ABNORMAL LOW (ref 150–400)
RBC: 3.09 MIL/uL — AB (ref 4.22–5.81)
RDW: 13.4 % (ref 11.5–15.5)
WBC: 3.1 10*3/uL — AB (ref 4.0–10.5)

## 2014-05-10 MED ORDER — SODIUM CHLORIDE 0.9 % IV SOLN
INTRAVENOUS | Status: DC
  Administered 2014-05-10 (×2): via INTRAVENOUS

## 2014-05-10 NOTE — Progress Notes (Signed)
[facility] Progress Note  Subjective: The patient feels better today. He has not vomited today or last night.  Objective: Vital signs in last 24 hours: Temp:  [98 F (36.7 C)-98.4 F (36.9 C)] 98.4 F (36.9 C) (06/07 0444) Pulse Rate:  [72-77] 72 (06/07 0444) Resp:  [18] 18 (06/07 0444) BP: (138-158)/(67-87) 158/87 mmHg (06/07 0444) SpO2:  [98 %-100 %] 98 % (06/07 0444) Weight change:    PE:  He is in no distress  Abdomen soft nontender  Lab Results: Results for orders placed during the hospital encounter of 05/02/14 (from the past 24 hour(s))  BASIC METABOLIC PANEL     Status: Abnormal   Collection Time    05/10/14  5:08 AM      Result Value Ref Range   Sodium 136 (*) 137 - 147 mEq/L   Potassium 6.0 (*) 3.7 - 5.3 mEq/L   Chloride 108  96 - 112 mEq/L   CO2 18 (*) 19 - 32 mEq/L   Glucose, Bld 92  70 - 99 mg/dL   BUN 23  6 - 23 mg/dL   Creatinine, Ser 3.98 (*) 0.50 - 1.35 mg/dL   Calcium 8.5  8.4 - 10.5 mg/dL   GFR calc non Af Amer 14 (*) >90 mL/min   GFR calc Af Amer 16 (*) >90 mL/min  CBC     Status: Abnormal   Collection Time    05/10/14  5:08 AM      Result Value Ref Range   WBC 3.1 (*) 4.0 - 10.5 K/uL   RBC 3.09 (*) 4.22 - 5.81 MIL/uL   Hemoglobin 9.0 (*) 13.0 - 17.0 g/dL   HCT 27.9 (*) 39.0 - 52.0 %   MCV 90.3  78.0 - 100.0 fL   MCH 29.1  26.0 - 34.0 pg   MCHC 32.3  30.0 - 36.0 g/dL   RDW 13.4  11.5 - 15.5 %   Platelets 134 (*) 150 - 400 K/uL    Studies/Results: No results found.    Assessment: gastroparesis  Plan: Continue Reglan    Wonda Horner 05/10/2014, 10:24 AM

## 2014-05-10 NOTE — Progress Notes (Signed)
[facility] PROGRESS NOTE  David [name] [id number] DOB: 04/02/1941 DOA: 05/02/2014 PCP: [name], NP  Brief narrative: 73 year old with history of hypertension, prostate adenocarcinoma Gleason grade 3  Presenting with vomiting, regurgitation, reports of dysphasia and reports of weight loss currently undergoing GI workup. 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   Assessment/Plan:    Loss of weight/dysphagia - GI on board. Appreciate their recommendations. - Barium swallow/EGD WNL. -Gastric emptying scan with signs of delayed gastric emptying; ?etiology (not a diabetic). -Feeling a little improved today. -On erythromycin to see if we can "jump-start" his gastric motility. -Continue reglan/PPI.    Acute on CKD Stage IV -Baseline Cr is 1.9 from 07/2013. -Cr has started to increase again. -Will start IVF and check a renal US for completeness to make sure no obstruction. -If continues to rise may need to consider a renal consultation. -Suspect a component of prerenal azotemia given poor oral intake and GI losses. -Continue to monitor renal function daily.    Hypotension - Most likely due to poor oral intake and dehydration.  - Has resolved with IV fluids.  Code Status: Full Family Communication: Discussed directly with patient Disposition Plan:Home when ready.   Consultants:  GI  Procedures:  Barium swallow  EGD  GES  Antibiotics:  None  HPI/Subjective: Patient has no new complaints. No emesis since yesterday morning.  Objective: Filed Vitals:   05/10/14 0444  BP: 158/87  Pulse: 72  Temp: 98.4 F (36.9 C)  Resp: 18    Intake/Output Summary (Last 24 hours) at 05/10/14 1241 Last data filed at 05/10/14 0900  Gross per 24 hour  Intake 3013.33 ml  Output   1875 ml  Net 1138.33 ml   Filed Weights   05/02/14 1643  Weight: 88.134 kg (194 lb 4.8 oz)    Exam:   General:  Patient in no acute distress, alert and  awake  Cardiovascular: Regular rate and rhythm, no murmurs or rubs  Respiratory: Clear to auscultation bilaterally no wheezes  Abdomen: Soft, nondistended, nontender  Musculoskeletal: No cyanosis or clubbing   Data Reviewed: Basic Metabolic Panel:  Recent Labs Lab 05/05/14 0439 05/06/14 2147 05/07/14 0515 05/08/14 0408 05/09/14 0540 05/10/14 0508  NA 140  --  137 136* 137 136*  K 4.7  --  5.2 5.2 5.7* 6.0*  CL 111  --  110 107 110 108  CO2 20  --  19 17* 18* 18*  GLUCOSE 92  --  102* 98 97 92  BUN 44*  --  29* 26* 24* 23  CREATININE 4.22*  --  3.83* 3.65* 3.72* 3.98*  CALCIUM 8.3*  --  8.2* 8.3* 8.1* 8.5  MG  --  1.7  --   --   --   --    Liver Function Tests: No results found for this basename: AST, ALT, ALKPHOS, BILITOT, PROT, ALBUMIN,  in the last 168 hours  Recent Labs Lab 05/04/14 0500  LIPASE 128*   No results found for this basename: AMMONIA,  in the last 168 hours CBC:  Recent Labs Lab 05/07/14 0515 05/10/14 0508  WBC 3.6* 3.1*  HGB 10.0* 9.0*  HCT 30.8* 27.9*  MCV 89.5 90.3  PLT 140* 134*   Cardiac Enzymes: No results found for this basename: CKTOTAL, CKMB, CKMBINDEX, TROPONINI,  in the last 168 hours BNP (last 3 results) No results found for this basename: PROBNP,  in the last 8760 hours CBG: No results found for this basename: GLUCAP,  in the last 168 hours  No results found for this or any previous visit (from the past 240 hour(s)).   Studies: No results found.  Scheduled Meds: . acetaminophen  500 mg Oral BID  . dorzolamide  1 drop Both Eyes TID  . erythromycin  100 mg Intravenous 3 times per day  . heparin  5,000 Units Subcutaneous 3 times per day  . latanoprost  1 drop Both Eyes QHS  . metoCLOPramide (REGLAN) injection  10 mg Intravenous 4 times per day  . neomycin-polymyxin b-dexamethasone  2 drop Both Eyes BID  . pantoprazole  40 mg Oral Daily  . sodium chloride  3 mL Intravenous Q12H  . timolol  1 drop Left Eye QHS    Continuous Infusions: . sodium chloride 75 mL/hr at 05/10/14 0911    Time spent: 35 minutes    [place] [facility] Pager [phone]  If 7PM-7AM, please contact night-coverage at [email or web address], password [facility] 05/10/2014, 12:41 PM  LOS: 8 days

## 2014-05-11 LAB — BASIC METABOLIC PANEL
BUN: 23 mg/dL (ref 6–23)
CHLORIDE: 108 meq/L (ref 96–112)
CO2: 17 meq/L — AB (ref 19–32)
CREATININE: 4.03 mg/dL — AB (ref 0.50–1.35)
Calcium: 8.5 mg/dL (ref 8.4–10.5)
GFR calc Af Amer: 16 mL/min — ABNORMAL LOW (ref >90)
GFR calc non Af Amer: 14 mL/min — ABNORMAL LOW (ref >90)
GLUCOSE: 74 mg/dL (ref 70–99)
Potassium: 5.9 mEq/L — ABNORMAL HIGH (ref 3.7–5.3)
Sodium: 136 mEq/L — ABNORMAL LOW (ref 137–147)

## 2014-05-11 LAB — URINALYSIS, ROUTINE W REFLEX MICROSCOPIC
Bilirubin Urine: NEGATIVE
GLUCOSE, UA: NEGATIVE mg/dL
Ketones, ur: NEGATIVE mg/dL
Leukocytes, UA: NEGATIVE
Nitrite: NEGATIVE
PROTEIN: 100 mg/dL — AB
SPECIFIC GRAVITY, URINE: 1.01 (ref 1.005–1.030)
Urobilinogen, UA: 0.2 mg/dL (ref 0.0–1.0)
pH: 5 (ref 5.0–8.0)

## 2014-05-11 LAB — IRON AND TIBC
Iron: 22 ug/dL — ABNORMAL LOW (ref 42–135)
Saturation Ratios: 13 % — ABNORMAL LOW (ref 20–55)
TIBC: 172 ug/dL — ABNORMAL LOW (ref 215–435)
UIBC: 150 ug/dL (ref 125–400)

## 2014-05-11 LAB — URINE MICROSCOPIC-ADD ON

## 2014-05-11 LAB — FERRITIN: FERRITIN: 2050 ng/mL — AB (ref 22–322)

## 2014-05-11 MED ORDER — SODIUM POLYSTYRENE SULFONATE 15 GM/60ML PO SUSP
30.0000 g | Freq: Two times a day (BID) | ORAL | Status: DC
  Administered 2014-05-11: 30 g via ORAL
  Filled 2014-05-11 (×3): qty 120

## 2014-05-11 NOTE — Progress Notes (Signed)
Physical Therapy Treatment Patient Details Name: David [name] MRN: [id number] DOB: [birth year] Today's Date: 05/11/2014    History of Present Illness 73 y.o. male admitted 5/30 with significant dehydration due to unable to eat food and weight loss with PMHx of CHF, HTN, prostate cancer.    PT Comments    Pt progressing well and would like to remain on PT caseload.  Pt close to achieving PT goals so may see for 1-2 more visits.  Follow Up Recommendations  No PT follow up     Equipment Recommendations  None recommended by PT (pt reports he has RW)    Recommendations for Other Services       Precautions / Restrictions Precautions Precautions: None    Mobility  Bed Mobility Overal bed mobility: Modified Independent                Transfers Overall transfer level: Modified independent Equipment used: Rolling walker (2 wheeled) Transfers: Sit to/from Stand Sit to Stand: Modified independent (Device/Increase time)            Ambulation/Gait Ambulation/Gait assistance: Supervision Ambulation Distance (Feet): 300 Feet Assistive device: Rolling walker (2 wheeled) Gait Pattern/deviations: Step-through pattern;Wide base of support Gait velocity: decr   General Gait Details: no LOB, steady with RW, verbal cues for RW position   Stairs            Wheelchair Mobility    Modified Rankin (Stroke Patients Only)       Balance                                    Cognition Arousal/Alertness: Awake/alert Behavior During Therapy: WFL for tasks assessed/performed Overall Cognitive Status: Within Functional Limits for tasks assessed                      Exercises      General Comments        Pertinent Vitals/Pain No pain    Home Living                      Prior Function            PT Goals (current goals can now be found in the care plan section) Acute Rehab PT Goals PT Goal Formulation: With patient Time For  Goal Achievement: 05/18/14 Potential to Achieve Goals: Good Progress towards PT goals: Progressing toward goals    Frequency  Min 3X/week    PT Plan Current plan remains appropriate    Co-evaluation             End of Session   Activity Tolerance: Patient tolerated treatment well Patient left: with call bell/phone within reach;in bed     Time: 1049-1109 PT Time Calculation (min): 20 min  Charges:  $Gait Training: 8-22 mins                    G Codes:      Junius Argyle 05/11/2014, 12:54 PM Carmelia Bake, PT, DPT 05/11/2014 Pager: [phone]

## 2014-05-11 NOTE — Progress Notes (Signed)
Feels ok today. Other than Reglan I have no other recommendations for his gastroparesis. This can be switched to po when he goes home. Will sign off. Call if needed.

## 2014-05-11 NOTE — Consult Note (Signed)
David [name] is an 73 y.o. male referred by Dr Jerilee Hoh   Chief Complaint: Acute on CKD HPI: [name] admitted 05/02/14 for dehydration, weight loss and chronic N/V.  Has baseline Scr 1.9 and on 5/27 Scr increased to 4.9 and on admission Scr 5.8.  SBP in the 90's on admission.  He had been on losartan but says this was stopped a few weeks prior to admission.  With hydration and improved BP, Scr decreased to 3.6 on 6/5 and then increased to 4.0 as of today.  Renal US shows no hydro and increased echogenicity, more pronounced than 1 1/2 yrs ago.  No further hypotension.  UO has been good.  He has been found to have gastroparesis and placed on reglan with some improvement in Sxs.  Past Medical History  Diagnosis Date  . Hypertension     followed by [facility] (Dr Dani Gobble Croitoru)  . Hyperlipidemia   . Cancer     hx of prostate; s/p radioactive seed implant 10/2009 Dr Janice Norrie  . 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 History of cardiac cath 2004    negative for CAD  . History of myocardial perfusion scan 02/2010    negative for coronary insufficiency (LVEF 27%)  . Anemia, iron deficiency 11/15/2011  . Nonischemic cardiomyopathy   . Sinus bradycardia     Past Surgical History  Procedure Laterality Date  . Radioactive seed implant  2010    prostate cancer  . Cardiac catheterization  02/04/2003    mildly depressed LV systolic fx EF [id number] coronaries/abdominal aorta/renal arteries.  . US echocardiography  02/06/2012    mild LVH,LA mod. dilated,mild-mod. 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 AI.  Marland Kitchen Nm myocar perf wall motion  02/21/2010    normal  . Esophagogastroduodenoscopy (egd) with propofol N/A 05/05/2014    Procedure: ESOPHAGOGASTRODUODENOSCOPY (EGD) WITH PROPOFOL;  Surgeon: Missy Sabins, MD;  Location: [facility];  Service: Endoscopy;  Laterality: N/A;    Family History  Problem  Relation Age of Onset  . Hypertension Mother   . Thyroid disease Mother   . Cholelithiasis Daughter   . Cholelithiasis Son   . Hypertension Maternal Grandmother   . Diabetes Maternal Grandmother   . Heart attack Neg Hx   . Hyperlipidemia Neg Hx   Son with what sounds like episode of ARF requiring HD but resolved.  He is not sure why  Social History:  reports that he quit smoking about 8 years ago. He has never used smokeless tobacco. He reports that he drinks alcohol. He reports that he does not use illicit drugs.  Allergies:  Allergies  Allergen Reactions  . Cozaar [Losartan Potassium] Other (See Comments)    constipation    Medications Prior to Admission  Medication Sig Dispense Refill  . acetaminophen (TYLENOL) 500 MG tablet Take 500 mg by mouth 2 (two) times daily.      Marland Kitchen amLODipine (NORVASC) 5 MG tablet Take 1 tablet (5 mg total) by mouth daily.  30 tablet  1  . aspirin 325 MG EC tablet Take 325 mg by mouth daily.        . carvedilol (COREG) 25 MG tablet Take 1 tablet (25 mg total) by mouth 2 (two) times daily.  180 tablet  3  . erythromycin ophthalmic ointment Place 1 application into the right eye at bedtime.      . finasteride (PROSCAR)  5 MG tablet Take 5 mg by mouth daily.       . NEOMYCIN-POLYMYXIN-HC, OPHTH, SUSP Place 2 drops into both eyes 2 (two) times daily.      . ondansetron (ZOFRAN ODT) 4 MG disintegrating tablet Take 1 tablet (4 mg total) by mouth every 8 (eight) hours as needed for nausea or vomiting.  30 tablet  0  . terazosin (HYTRIN) 10 MG capsule Take 10 mg by mouth at bedtime.       Marland Kitchen VIGAMOX 0.5 % ophthalmic solution Place 1 drop into the right eye every 2 (two) hours while awake.      . [DISCONTINUED] NEOMYCIN-POLYMYXIN-HC, OPHTH, SUSP Apply 2 drops to eye 2 (two) times daily.  7.5 mL  0  . losartan-hydrochlorothiazide (HYZAAR) 100-12.5 MG per tablet Take 1 tablet by mouth daily.         Lab Results: UA: 100mg  protein   Recent Labs  05/10/14 0508   WBC 3.1*  HGB 9.0*  HCT 27.9*  PLT 134*   BMET  Recent Labs  05/09/14 0540 05/10/14 0508 05/11/14 0445  NA 137 136* 136*  K 5.7* 6.0* 5.9*  CL 110 108 108  CO2 18* 18* 17*  GLUCOSE 97 92 74  BUN 24* 23 23  CREATININE 3.72* 3.98* 4.03*  CALCIUM 8.1* 8.5 8.5   LFT No results found for this basename: PROT, ALBUMIN, AST, ALT, ALKPHOS, BILITOT, BILIDIR, IBILI,  in the last 72 hours US Renal  05/10/2014   CLINICAL DATA:  Acute renal failure. History of chronic kidney disease. Hypertension. Prostate cancer.  EXAM: RENAL/URINARY TRACT ULTRASOUND COMPLETE  COMPARISON:  Renal ultrasound 09/18/2013.  FINDINGS: Right Kidney:  Length: 11.9 cm. Cortex is diffusely echogenic. No hydronephrosis visualized. Small anechoic lesions with increased through transmission compatible with simple cysts, largest of which is in the medial aspect of the lower pole measuring 1.2 x 1.2 x 1.4 cm.  Left Kidney:  Length: 11.7 cm. Cortex is diffusely echogenic. No hydronephrosis visualized. Small anechoic lesion with increased through transmission compatible with a small cyst in the lower pole.  Bladder:  Appears normal for degree of bladder distention.  IMPRESSION: 1. Echogenic kidneys bilaterally indicative of underlying medical renal disease. 2. No hydronephrosis. 3. Multiple small cysts in the kidneys bilaterally, as above.   Electronically Signed   By: Vinnie Langton M.D.   On: 05/10/2014 12:40    ROS: Some decreased visual acuity No SOB No CP Still with sx of dysphasia, no abd pain No arthritic CO No neuropathic sx No dysuria   PHYSICAL EXAM: Blood pressure 165/78, pulse 80, temperature 99.1 F (37.3 C), temperature source Oral, resp. rate 18, height 6\' 2"  (1.88 m), weight 88.134 kg (194 lb 4.8 oz), SpO2 100.00%. HEENT: PERRLA EOMI NECK:No JVD LUNGS:Clear CARDIAC:RRR wo MRG ABD:+ BS NTND No HSM EXT:No edema NEURO:CNI M&SI OX3 no asterixis  Assessment: 1. Acute on CKD 3 presumably due to ATN from  dehydration and hypotension (? If was on losartan on adm).  Unclear why Scr has sl increased over last few days.  He has had a renal WU as recently as 10/14 with neg SPEP, free light chains, ANA and complements.  Since US shows marked increased in echogenicity of kidneys,  will check HIV.  Plt ct low, will check for schistocytes and recheck SPEP 2. HTN 3. Gastroparesis 4. Hyperkalemia 5. Anemia 6. Thrombocytopenia 7. Metabolic acidosis, most likely PLAN: 1. Check HIV, SPEP, Smear review, iron studies 2. K restricted diet 3.  PO kayexalate 4. Daily Scr.  I suspect he may just require more time to fully recover from this acute insult.  There is a ? of Sjogren's in the past with + SSA AB.  If renal fx does not recover or if worsens then may need Bx. 5. DC IV fluids 6. Follow CBC 7. Recheck UA   Windy Kalata 05/11/2014, 12:23 PM

## 2014-05-11 NOTE — Progress Notes (Signed)
[facility] PROGRESS NOTE  David [name] [id number] DOB: Jan 17, 1941 DOA: 05/02/2014 PCP: [name], NP  Brief narrative: 73 year old with history of hypertension, prostate adenocarcinoma Gleason grade 3  Presenting with vomiting, regurgitation, reports of dysphasia and reports of weight loss currently undergoing GI workup. 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   Assessment/Plan:    Loss of weight/dysphagia - GI has signed off 6/8.Marland Kitchen Appreciate their recommendations. - Barium swallow/EGD WNL. -Gastric emptying scan with signs of delayed gastric emptying; ?etiology (not a diabetic). -Feeling a little improved today. -On erythromycin to see if we can "jump-start" his gastric motility. -Continue reglan/PPI.    Acute on CKD Stage IV -Baseline Cr is 1.9 from 07/2013. -Cr has started to increase again. -Renal US without obstruction. -Renal consultation requested. -Suspect a component of prerenal azotemia given poor oral intake and GI losses. -Continue to monitor renal function daily.    Hypotension - Most likely due to poor oral intake and dehydration.  - Has resolved with IV fluids.  Code Status: Full Family Communication: Discussed directly with patient Disposition Plan:Home when ready.   Consultants:  GI  Renal  Procedures:  Barium swallow  EGD  GES  Antibiotics:  None  HPI/Subjective: Patient has no new complaints. One episode of emesis overnight. First in 2.5 days!  Objective: Filed Vitals:   05/11/14 0458  BP: 165/78  Pulse: 80  Temp: 99.1 F (37.3 C)  Resp: 18    Intake/Output Summary (Last 24 hours) at 05/11/14 1317 Last data filed at 05/11/14 [id number]  Gross per 24 hour  Intake 1961.25 ml  Output   1151 ml  Net 810.25 ml   Filed Weights   05/02/14 1643  Weight: 88.134 kg (194 lb 4.8 oz)    Exam:   General:  Patient in no acute distress, alert and awake  Cardiovascular: Regular rate and rhythm, no murmurs  or rubs  Respiratory: Clear to auscultation bilaterally no wheezes  Abdomen: Soft, nondistended, nontender  Musculoskeletal: No cyanosis or clubbing   Data Reviewed: Basic Metabolic Panel:  Recent Labs Lab 05/05/14 0439 05/06/14 2147 05/07/14 0515 05/08/14 0408 05/09/14 0540 05/10/14 0508 05/11/14 0445  NA 140  --  137 136* 137 136* 136*  K 4.7  --  5.2 5.2 5.7* 6.0* 5.9*  CL 111  --  110 107 110 108 108  CO2 20  --  19 17* 18* 18* 17*  GLUCOSE 92  --  102* 98 97 92 74  BUN 44*  --  29* 26* 24* 23 23  CREATININE 4.22*  --  3.83* 3.65* 3.72* 3.98* 4.03*  CALCIUM 8.3*  --  8.2* 8.3* 8.1* 8.5 8.5  MG  --  1.7  --   --   --   --   --    Liver Function Tests: No results found for this basename: AST, ALT, ALKPHOS, BILITOT, PROT, ALBUMIN,  in the last 168 hours No results found for this basename: LIPASE, AMYLASE,  in the last 168 hours No results found for this basename: AMMONIA,  in the last 168 hours CBC:  Recent Labs Lab 05/07/14 0515 05/10/14 0508  WBC 3.6* 3.1*  HGB 10.0* 9.0*  HCT 30.8* 27.9*  MCV 89.5 90.3  PLT 140* 134*   Cardiac Enzymes: No results found for this basename: CKTOTAL, CKMB, CKMBINDEX, TROPONINI,  in the last 168 hours BNP (last 3 results) No results found for this basename: PROBNP,  in the last 8760 hours CBG: No results  found for this basename: GLUCAP,  in the last 168 hours  No results found for this or any previous visit (from the past 240 hour(s)).   Studies: US Renal  05/10/2014   CLINICAL DATA:  Acute renal failure. History of chronic kidney disease. Hypertension. Prostate cancer.  EXAM: RENAL/URINARY TRACT ULTRASOUND COMPLETE  COMPARISON:  Renal ultrasound 09/18/2013.  FINDINGS: Right Kidney:  Length: 11.9 cm. Cortex is diffusely echogenic. No hydronephrosis visualized. Small anechoic lesions with increased through transmission compatible with simple cysts, largest of which is in the medial aspect of the lower pole measuring 1.2 x 1.2 x  1.4 cm.  Left Kidney:  Length: 11.7 cm. Cortex is diffusely echogenic. No hydronephrosis visualized. Small anechoic lesion with increased through transmission compatible with a small cyst in the lower pole.  Bladder:  Appears normal for degree of bladder distention.  IMPRESSION: 1. Echogenic kidneys bilaterally indicative of underlying medical renal disease. 2. No hydronephrosis. 3. Multiple small cysts in the kidneys bilaterally, as above.   Electronically Signed   By: Vinnie Langton M.D.   On: 05/10/2014 12:40    Scheduled Meds: . acetaminophen  500 mg Oral BID  . dorzolamide  1 drop Both Eyes TID  . erythromycin  100 mg Intravenous 3 times per day  . heparin  5,000 Units Subcutaneous 3 times per day  . latanoprost  1 drop Both Eyes QHS  . metoCLOPramide (REGLAN) injection  10 mg Intravenous 4 times per day  . neomycin-polymyxin b-dexamethasone  2 drop Both Eyes BID  . pantoprazole  40 mg Oral Daily  . sodium chloride  3 mL Intravenous Q12H  . timolol  1 drop Left Eye QHS   Continuous Infusions: . sodium chloride 75 mL/hr at 05/10/14 2253    Time spent: 35 minutes    [facility] Pager [phone]  If 7PM-7AM, please contact night-coverage at [email or web address], password [email or web address] 05/11/2014, 1:17 PM  LOS: 9 days

## 2014-05-12 ENCOUNTER — Inpatient Hospital Stay (HOSPITAL_COMMUNITY): Payer: Medicare HMO

## 2014-05-12 LAB — HIV 1/2 CONFIRMATION
HIV 2 AB: NEGATIVE
HIV-1 antibody: POSITIVE — AB

## 2014-05-12 LAB — CBC WITH DIFFERENTIAL/PLATELET
BASOS ABS: 0 10*3/uL (ref 0.0–0.1)
Basophils Relative: 0 % (ref 0–1)
Eosinophils Absolute: 0.1 10*3/uL (ref 0.0–0.7)
Eosinophils Relative: 5 % (ref 0–5)
HEMATOCRIT: 26.6 % — AB (ref 39.0–52.0)
Hemoglobin: 8.8 g/dL — ABNORMAL LOW (ref 13.0–17.0)
LYMPHS PCT: 32 % (ref 12–46)
Lymphs Abs: 0.8 10*3/uL (ref 0.7–4.0)
MCH: 29.5 pg (ref 26.0–34.0)
MCHC: 33.1 g/dL (ref 30.0–36.0)
MCV: 89.3 fL (ref 78.0–100.0)
MONOS PCT: 21 % — AB (ref 3–12)
Monocytes Absolute: 0.5 10*3/uL (ref 0.1–1.0)
NEUTROS ABS: 1.1 10*3/uL — AB (ref 1.7–7.7)
Neutrophils Relative %: 42 % — ABNORMAL LOW (ref 43–77)
Platelets: 169 10*3/uL (ref 150–400)
RBC: 2.98 MIL/uL — ABNORMAL LOW (ref 4.22–5.81)
RDW: 13.5 % (ref 11.5–15.5)
WBC: 2.5 10*3/uL — AB (ref 4.0–10.5)

## 2014-05-12 LAB — RENAL FUNCTION PANEL
ALBUMIN: 2.3 g/dL — AB (ref 3.5–5.2)
BUN: 27 mg/dL — ABNORMAL HIGH (ref 6–23)
CO2: 16 mEq/L — ABNORMAL LOW (ref 19–32)
Calcium: 8.6 mg/dL (ref 8.4–10.5)
Chloride: 109 mEq/L (ref 96–112)
Creatinine, Ser: 4.23 mg/dL — ABNORMAL HIGH (ref 0.50–1.35)
GFR, EST AFRICAN AMERICAN: 15 mL/min — AB (ref >90)
GFR, EST NON AFRICAN AMERICAN: 13 mL/min — AB (ref >90)
Glucose, Bld: 73 mg/dL (ref 70–99)
PHOSPHORUS: 4.2 mg/dL (ref 2.3–4.6)
Potassium: 5.6 mEq/L — ABNORMAL HIGH (ref 3.7–5.3)
SODIUM: 138 meq/L (ref 137–147)

## 2014-05-12 LAB — DIC (DISSEMINATED INTRAVASCULAR COAGULATION)PANEL
D-Dimer, Quant: 1.56 ug/mL-FEU — ABNORMAL HIGH (ref 0.00–0.48)
Fibrinogen: 418 mg/dL (ref 204–475)
INR: 1.07 (ref 0.00–1.49)
Platelets: 162 10*3/uL (ref 150–400)
Prothrombin Time: 13.7 seconds (ref 11.6–15.2)
aPTT: 50 seconds — ABNORMAL HIGH (ref 24–37)

## 2014-05-12 LAB — HIV ANTIBODY (ROUTINE TESTING W REFLEX): HIV 1&2 Ab, 4th Generation: REACTIVE — AB

## 2014-05-12 LAB — DIC (DISSEMINATED INTRAVASCULAR COAGULATION) PANEL: SMEAR REVIEW: NONE SEEN

## 2014-05-12 MED ORDER — SODIUM POLYSTYRENE SULFONATE 15 GM/60ML PO SUSP
30.0000 g | Freq: Once | ORAL | Status: AC
  Administered 2014-05-12: 30 g via ORAL
  Filled 2014-05-12: qty 120

## 2014-05-12 MED ORDER — FERUMOXYTOL INJECTION 510 MG/17 ML
1020.0000 mg | Freq: Once | INTRAVENOUS | Status: AC
  Administered 2014-05-12: 1020 mg via INTRAVENOUS
  Filled 2014-05-12: qty 34

## 2014-05-12 MED ORDER — SODIUM BICARBONATE 650 MG PO TABS
650.0000 mg | ORAL_TABLET | Freq: Three times a day (TID) | ORAL | Status: DC
  Administered 2014-05-12 – 2014-05-15 (×11): 650 mg via ORAL
  Filled 2014-05-12 (×12): qty 1

## 2014-05-12 MED ORDER — SODIUM CHLORIDE 0.45 % IV SOLN
INTRAVENOUS | Status: DC
  Administered 2014-05-12 – 2014-05-15 (×4): via INTRAVENOUS

## 2014-05-12 MED ORDER — SODIUM CHLORIDE 0.9 % IJ SOLN
10.0000 mL | INTRAMUSCULAR | Status: DC | PRN
  Administered 2014-05-12 – 2014-05-14 (×4): 10 mL

## 2014-05-12 MED ORDER — LIDOCAINE HCL 1 % IJ SOLN
INTRAMUSCULAR | Status: AC
  Filled 2014-05-12: qty 20

## 2014-05-12 MED ORDER — HYDRALAZINE HCL 20 MG/ML IJ SOLN
5.0000 mg | Freq: Once | INTRAMUSCULAR | Status: AC
  Administered 2014-05-12: 5 mg via INTRAVENOUS
  Filled 2014-05-12: qty 0.25

## 2014-05-12 NOTE — Progress Notes (Addendum)
Alerted by Dr. [name] dam who received alert on system that patient is HIV +. Confirmatory testing will be ordered. Appreciate dr. Tommy Medal input Patient not aware as yet.  Dr. [name] dam will update.  Verneita Griffes, MD [facility] [phone]

## 2014-05-12 NOTE — Progress Notes (Signed)
[facility]   I was alerted by Vigilanz to patients HIV + test from yesterday  PLEASE DO NOT DC HIM UNTIL I CAN MEET WITH HIM TOMORROW  I am ordering an HIV RNA with reflex quant genotype, CD4, HLA b701 hepatitis panel, RPR on him  I spoke to him just now and informed him of his diagnosis  I am wondering if he might have HIV AN and whether or not this might account for his AKI  I plan on starting him on ARV tomorrow after I have had a chance to discuss a few regimens with him. I will be by in the early afternoon after my clinic  I expect he has a very high viral load.  He did not have further questions for me today.   This man is a perfect example why EVERYONE should be tested in health care system even if individuals fall outside of the CDC recs of mandatory testing 16-65 and irrespective of perceived risk factors.  [employer] is #20 for HIV tranmission among all CITIES in the [place], so we clearly have a huge burden of undiagnosed infection

## 2014-05-12 NOTE — Progress Notes (Signed)
S: Ate well last night.  Says all his urine wasn't recorded? O:BP 187/82  Pulse 74  Temp(Src) 98.3 F (36.8 C) (Oral)  Resp 15  Ht 6\' 2"  (1.88 m)  Wt 88.134 kg (194 lb 4.8 oz)  BMI 24.94 kg/m2  SpO2 100%  Intake/Output Summary (Last 24 hours) at 05/12/14 0739 Last data filed at 05/12/14 0541  Gross per 24 hour  Intake 799.25 ml  Output    500 ml  Net 299.25 ml   Weight change:  [id number] and alert CVS:RRR Resp:clear Abd:+ BS NTND Ext:no edema NEURO:CNI Ox3 no asterixis   . acetaminophen  500 mg Oral BID  . dorzolamide  1 drop Both Eyes TID  . erythromycin  100 mg Intravenous 3 times per day  . ferumoxytol  1,020 mg Intravenous Once  . heparin  5,000 Units Subcutaneous 3 times per day  . latanoprost  1 drop Both Eyes QHS  . metoCLOPramide (REGLAN) injection  10 mg Intravenous 4 times per day  . neomycin-polymyxin b-dexamethasone  2 drop Both Eyes BID  . pantoprazole  40 mg Oral Daily  . sodium chloride  3 mL Intravenous Q12H  . sodium polystyrene  30 g Oral BID  . timolol  1 drop Left Eye QHS   US Renal  05/10/2014   CLINICAL DATA:  Acute renal failure. History of chronic kidney disease. Hypertension. Prostate cancer.  EXAM: RENAL/URINARY TRACT ULTRASOUND COMPLETE  COMPARISON:  Renal ultrasound 09/18/2013.  FINDINGS: Right Kidney:  Length: 11.9 cm. Cortex is diffusely echogenic. No hydronephrosis visualized. Small anechoic lesions with increased through transmission compatible with simple cysts, largest of which is in the medial aspect of the lower pole measuring 1.2 x 1.2 x 1.4 cm.  Left Kidney:  Length: 11.7 cm. Cortex is diffusely echogenic. No hydronephrosis visualized. Small anechoic lesion with increased through transmission compatible with a small cyst in the lower pole.  Bladder:  Appears normal for degree of bladder distention.  IMPRESSION: 1. Echogenic kidneys bilaterally indicative of underlying medical renal disease. 2. No hydronephrosis. 3. Multiple small cysts in  the kidneys bilaterally, as above.   Electronically Signed   By: Vinnie Langton M.D.   On: 05/10/2014 12:40   BMET    Component Value Date/Time   NA 138 05/12/2014 0450   K 5.6* 05/12/2014 0450   CL 109 05/12/2014 0450   CO2 16* 05/12/2014 0450   GLUCOSE 73 05/12/2014 0450   BUN 27* 05/12/2014 0450   CREATININE 4.23* 05/12/2014 0450   CREATININE 4.90* 04/29/2014 0855   CALCIUM 8.6 05/12/2014 0450   GFRNONAA 13* 05/12/2014 0450   GFRNONAA 11* 04/29/2014 0855   GFRAA 15* 05/12/2014 0450   GFRAA 13* 04/29/2014 0855   CBC    Component Value Date/Time   WBC 2.5* 05/12/2014 0450   WBC 3.7* 03/06/2013 1138   RBC 2.98* 05/12/2014 0450   RBC 3.55* 03/06/2013 1138   RBC 3.56* 03/06/2013 1138   HGB 8.8* 05/12/2014 0450   HGB 11.1* 03/06/2013 1138   HCT 26.6* 05/12/2014 0450   HCT 33.8* 03/06/2013 1138   PLT 162 05/12/2014 0450   PLT 169 05/12/2014 0450   PLT 177 03/06/2013 1138   MCV 89.3 05/12/2014 0450   MCV 95 03/06/2013 1138   MCH 29.5 05/12/2014 0450   MCH 31.2 03/06/2013 1138   MCHC 33.1 05/12/2014 0450   MCHC 32.8 03/06/2013 1138   RDW 13.5 05/12/2014 0450   RDW 13.4 03/06/2013 1138   LYMPHSABS 0.8 05/12/2014 0450  LYMPHSABS 1.5 03/06/2013 1138   MONOABS 0.5 05/12/2014 0450   EOSABS 0.1 05/12/2014 0450   EOSABS 0.3 03/06/2013 1138   BASOSABS 0.0 05/12/2014 0450   BASOSABS 0.0 03/06/2013 1138     Assessment: 1. Acute on CKD 3, Scr cont to trend sl higher.  UA benign except small amt of protein.  No schistocytes and Plt ct higher today 2. Hyperkalemia 3. Presumed metabolic acidosis 4. Iron Def and now has pancytopenia 5. HTN Plan: 1.  PO intake does not seem to be great so will resume IV fluids 2. Start PO bicarb 3.  Give another dose of kayexalate 4. Daily Scr 5. Rec resuming his antiHTN   Windy Kalata

## 2014-05-12 NOTE — Progress Notes (Signed)
[facility] PROGRESS NOTE  David [name] [id number] DOB: [birth year] DOA: 05/02/2014 PCP: [name], NP  Brief narrative: 73 year old with history of hypertension, prostate adenocarcinoma stage 1, Gleason grade 3, s/p Radiation seed implants by Dr. Janice Norrie in the past.   Presenting with vomiting, regurgitation, reports of dysphasia and reports of weight loss currently undergoing GI workup. 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   Assessment/Plan:    Loss of weight/dysphagia - GI recommends reglan and signed off 6/9 - Barium swallow/EGD WNL. -Gastric emptying scan with signs of delayed gastric emptying; ?etiology (not a diabetic). -Feeling a little improved today. -On erythromycin to see if we can "jump-start" his gastric motility. -Continue reglan/PPI.    Acute on CKD Stage IV -Baseline Cr is 1.9 from 07/2013. -Cr has peaked to 4.23 -Renal US shows medical renal disease and small cysts  -Suspect prerenal azotemia given poor oral intake and GI losses. -Continue to monitor renal function daily. -Nephrology currently recommending IV fluids, Kayexalate and PO bicarb    Hypotension - Most likely due to poor oral intake and dehydration.  - Has resolved with IV fluids.  Stage 1 Prostate CA -Low threshold to rule out other causes of renal obstruction given h/o Prostate Ca. -if no cause is found and creat continues to rise, consider involving Urology -add PSA in am 6/10  Anemia, potentially 2/2 to androgen deprivation 2/2 to prostate Ca indeterminate anemia-stavble Receiving IV iron today 6/9   Chronic chf  Code Status: Full Family Communication: Discussed directly with patient Disposition Plan:Home when ready.   Consultants:  GI  Procedures:  Barium swallow  EGD  GES  Antibiotics:  None  HPI/Subjective:  Feeling fair. NO other issues currently Tol diet better today.  Objective: Filed Vitals:   05/12/14 1400  BP: 157/88    Pulse: 82  Temp: 98.6 F (37 C)  Resp: 16    Intake/Output Summary (Last 24 hours) at 05/12/14 1432 Last data filed at 05/12/14 1403  Gross per 24 hour  Intake  503.5 ml  Output    250 ml  Net  253.5 ml   Filed Weights   05/02/14 1643  Weight: 88.134 kg (194 lb 4.8 oz)    Exam:   General:  Patient in no acute distress, alert and awake  Cardiovascular: Regular rate and rhythm, no murmurs or rubs  Respiratory: Clear to auscultation bilaterally no wheezes  Abdomen: Soft, nondistended, nontender  Musculoskeletal: No cyanosis or clubbing   Data Reviewed: Basic Metabolic Panel:  Recent Labs Lab 05/06/14 2147  05/08/14 0408 05/09/14 0540 05/10/14 0508 05/11/14 0445 05/12/14 0450  NA  --   < > 136* 137 136* 136* 138  K  --   < > 5.2 5.7* 6.0* 5.9* 5.6*  CL  --   < > 107 110 108 108 109  CO2  --   < > 17* 18* 18* 17* 16*  GLUCOSE  --   < > 98 97 92 74 73  BUN  --   < > 26* 24* 23 23 27*  CREATININE  --   < > 3.65* 3.72* 3.98* 4.03* 4.23*  CALCIUM  --   < > 8.3* 8.1* 8.5 8.5 8.6  MG 1.7  --   --   --   --   --   --   PHOS  --   --   --   --   --   --  4.2  < > = values  in this interval not displayed. Liver Function Tests:  Recent Labs Lab 05/12/14 0450  ALBUMIN 2.3*   No results found for this basename: LIPASE, AMYLASE,  in the last 168 hours No results found for this basename: AMMONIA,  in the last 168 hours CBC:  Recent Labs Lab 05/07/14 0515 05/10/14 0508 05/12/14 0450  WBC 3.6* 3.1* 2.5*  NEUTROABS  --   --  1.1*  HGB 10.0* 9.0* 8.8*  HCT 30.8* 27.9* 26.6*  MCV 89.5 90.3 89.3  PLT 140* 134* 162  169   Cardiac Enzymes: No results found for this basename: CKTOTAL, CKMB, CKMBINDEX, TROPONINI,  in the last 168 hours BNP (last 3 results) No results found for this basename: PROBNP,  in the last 8760 hours CBG: No results found for this basename: GLUCAP,  in the last 168 hours  No results found for this or any previous visit (from the past 240  hour(s)).   Studies: No results found.  Scheduled Meds: . acetaminophen  500 mg Oral BID  . dorzolamide  1 drop Both Eyes TID  . erythromycin  100 mg Intravenous 3 times per day  . ferumoxytol  1,020 mg Intravenous Once  . heparin  5,000 Units Subcutaneous 3 times per day  . latanoprost  1 drop Both Eyes QHS  . metoCLOPramide (REGLAN) injection  10 mg Intravenous 4 times per day  . neomycin-polymyxin b-dexamethasone  2 drop Both Eyes BID  . pantoprazole  40 mg Oral Daily  . sodium bicarbonate  650 mg Oral TID  . sodium chloride  3 mL Intravenous Q12H  . timolol  1 drop Left Eye QHS   Continuous Infusions: . sodium chloride 50 mL/hr at 05/12/14 [id number]    Time spent: 35 minutes  Verneita Griffes, MD [facility] [phone]

## 2014-05-12 NOTE — Procedures (Signed)
RIJV PICC SVC RA 18 cm

## 2014-05-13 LAB — HEPATITIS PANEL, ACUTE
HCV Ab: NEGATIVE
Hep A IgM: NONREACTIVE
Hep B C IgM: NONREACTIVE
Hepatitis B Surface Ag: NEGATIVE

## 2014-05-13 LAB — RENAL FUNCTION PANEL
ALBUMIN: 2.4 g/dL — AB (ref 3.5–5.2)
BUN: 28 mg/dL — AB (ref 6–23)
CHLORIDE: 109 meq/L (ref 96–112)
CO2: 16 meq/L — AB (ref 19–32)
CREATININE: 4.22 mg/dL — AB (ref 0.50–1.35)
Calcium: 8.7 mg/dL (ref 8.4–10.5)
GFR calc Af Amer: 15 mL/min — ABNORMAL LOW (ref >90)
GFR calc non Af Amer: 13 mL/min — ABNORMAL LOW (ref >90)
GLUCOSE: 72 mg/dL (ref 70–99)
POTASSIUM: 4.9 meq/L (ref 3.7–5.3)
Phosphorus: 4.4 mg/dL (ref 2.3–4.6)
Sodium: 139 mEq/L (ref 137–147)

## 2014-05-13 LAB — IMMUNOFIXATION ELECTROPHORESIS
IGM, SERUM: 295 mg/dL — AB (ref 41–251)
IgA: 273 mg/dL (ref 68–379)
IgG (Immunoglobin G), Serum: 1470 mg/dL (ref 650–1600)
TOTAL PROTEIN ELP: 6.2 g/dL (ref 6.0–8.3)

## 2014-05-13 LAB — PROTEIN ELECTROPHORESIS, SERUM
ALPHA-1-GLOBULIN: 6.5 % — AB (ref 2.9–4.9)
Albumin ELP: 46.9 % — ABNORMAL LOW (ref 55.8–66.1)
Alpha-2-Globulin: 11.3 % (ref 7.1–11.8)
Beta 2: 6.6 % — ABNORMAL HIGH (ref 3.2–6.5)
Beta Globulin: 4.5 % — ABNORMAL LOW (ref 4.7–7.2)
Gamma Globulin: 24.2 % — ABNORMAL HIGH (ref 11.1–18.8)
M-Spike, %: NOT DETECTED g/dL
TOTAL PROTEIN ELP: 6.3 g/dL (ref 6.0–8.3)

## 2014-05-13 LAB — RPR TITER

## 2014-05-13 LAB — BASIC METABOLIC PANEL
BUN: 28 mg/dL — ABNORMAL HIGH (ref 6–23)
CHLORIDE: 110 meq/L (ref 96–112)
CO2: 17 meq/L — AB (ref 19–32)
CREATININE: 4.23 mg/dL — AB (ref 0.50–1.35)
Calcium: 8.7 mg/dL (ref 8.4–10.5)
GFR calc non Af Amer: 13 mL/min — ABNORMAL LOW (ref >90)
GFR, EST AFRICAN AMERICAN: 15 mL/min — AB (ref >90)
GLUCOSE: 71 mg/dL (ref 70–99)
POTASSIUM: 5 meq/L (ref 3.7–5.3)
Sodium: 140 mEq/L (ref 137–147)

## 2014-05-13 LAB — T-HELPER CELLS (CD4) COUNT (NOT AT ARMC)
CD4 % Helper T Cell: 7 % — ABNORMAL LOW (ref 33–55)
CD4 T Cell Abs: 60 /uL — ABNORMAL LOW (ref 400–2700)

## 2014-05-13 LAB — RPR: RPR: REACTIVE — AB

## 2014-05-13 LAB — T.PALLIDUM AB, IGG

## 2014-05-13 LAB — HIV-1 RNA ULTRAQUANT REFLEX TO GENTYP+
HIV 1 RNA QUANT: 333538 {copies}/mL — AB (ref <20)
HIV-1 RNA Quant, Log: 5.52 {Log} — ABNORMAL HIGH (ref <1.30)

## 2014-05-13 MED ORDER — SULFAMETHOXAZOLE-TMP DS 800-160 MG PO TABS
1.0000 | ORAL_TABLET | ORAL | Status: DC
  Administered 2014-05-14: 1 via ORAL
  Filled 2014-05-13: qty 1

## 2014-05-13 MED ORDER — AZITHROMYCIN 600 MG PO TABS
1200.0000 mg | ORAL_TABLET | ORAL | Status: DC
  Administered 2014-05-15: 1200 mg via ORAL
  Filled 2014-05-13: qty 2

## 2014-05-13 MED ORDER — LAMIVUDINE 10 MG/ML PO SOLN
100.0000 mg | Freq: Every day | ORAL | Status: DC
  Administered 2014-05-14 – 2014-05-15 (×2): 100 mg via ORAL
  Filled 2014-05-13 (×2): qty 10

## 2014-05-13 MED ORDER — CARVEDILOL 25 MG PO TABS
25.0000 mg | ORAL_TABLET | Freq: Two times a day (BID) | ORAL | Status: DC
  Administered 2014-05-13 – 2014-05-15 (×5): 25 mg via ORAL
  Filled 2014-05-13 (×8): qty 1

## 2014-05-13 MED ORDER — HYDRALAZINE HCL 20 MG/ML IJ SOLN
10.0000 mg | Freq: Once | INTRAMUSCULAR | Status: AC
  Administered 2014-05-13: 10 mg via INTRAVENOUS
  Filled 2014-05-13: qty 0.5

## 2014-05-13 MED ORDER — TERAZOSIN HCL 5 MG PO CAPS
10.0000 mg | ORAL_CAPSULE | Freq: Every day | ORAL | Status: DC
  Administered 2014-05-13 – 2014-05-14 (×2): 10 mg via ORAL
  Filled 2014-05-13 (×4): qty 2

## 2014-05-13 MED ORDER — ZIDOVUDINE 100 MG PO CAPS
300.0000 mg | ORAL_CAPSULE | Freq: Two times a day (BID) | ORAL | Status: DC
  Administered 2014-05-13 – 2014-05-15 (×4): 300 mg via ORAL
  Filled 2014-05-13 (×5): qty 3

## 2014-05-13 MED ORDER — LAMIVUDINE 150 MG PO TABS
150.0000 mg | ORAL_TABLET | Freq: Once | ORAL | Status: AC
  Administered 2014-05-13: 150 mg via ORAL
  Filled 2014-05-13: qty 1

## 2014-05-13 MED ORDER — AMLODIPINE BESYLATE 5 MG PO TABS
5.0000 mg | ORAL_TABLET | Freq: Every day | ORAL | Status: DC
  Administered 2014-05-13 – 2014-05-15 (×3): 5 mg via ORAL
  Filled 2014-05-13 (×3): qty 1

## 2014-05-13 MED ORDER — RALTEGRAVIR POTASSIUM 400 MG PO TABS
400.0000 mg | ORAL_TABLET | Freq: Two times a day (BID) | ORAL | Status: DC
  Administered 2014-05-13 – 2014-05-15 (×4): 400 mg via ORAL
  Filled 2014-05-13 (×5): qty 1

## 2014-05-13 NOTE — Consult Note (Signed)
[facility]    Date of Admission:  05/02/2014  Date of Consult:  05/13/2014  Reason for Consult: HIV/AIDS, AKI Referring Physician: Dr. Verlon Au   HPI: David [name] is an 73 y.o. male with PMHX significant for prostate cancer, systolic and diastolic CHF, HTNSive cardiomopathy admitted with weight loss, nausea, vomiting, dysphagia. He had lost 22 # since February. On admission he was found to be in ARF on top of CKD.  He was exhaustively worked up, though he did not get checked for HIV until he himself reportedly asked for a test for STD's--(he has since acted as if he never asked for this test)  His HIV confirmatory 4th gen antibody test came back positive yesterday and I was alerted to + test via [place] system.   I discussed case with Dr. Verlon Au and spoke with pt over phone yesterday.  Data still coming in but his CD4 is 60, and he undoubtedly has sky high VL probably 100K to million/ml of blood.   He last had sex one month ago.   His syphilis test came back + at 1:4. 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  With re to past HIV testing, pt had negative HIV test by p24 antigen as recently as 2011 so his HIV progression has been something fairly rapidly in past 4 years.   In talking with the patient he denies any hx of IVDU. He also denies sex with men or with prostutes.  He is currently married and lives with his wife. I have informed him that he will need to disclose his status to his wife and he should to any other sexual partners. Certainly DIS will also be getting in touch with him as soon as they have his test results.  I am concerned that his current AKI could be due to his HIV, certainly it would also explain his weight loss. EGD did not describe any thrush or ulcers to make me think he might have HSV or CMV  esophagitis.      Past Medical History  Diagnosis Date  . Hypertension     followed by [facility] (Dr Dani Gobble Croitoru)  . Hyperlipidemia   . Cancer     hx of prostate; s/p radioactive seed implant 10/2009 Dr Janice Norrie  . Systolic and diastolic CHF, acute 0/2542    felt to be secondary to hypertensive cardiomyopathy  . History of cardiac cath 2004    negative for CAD  . History of myocardial perfusion scan 02/2010    negative for coronary insufficiency (LVEF 27%)  . Anemia, iron deficiency 11/15/2011  . Nonischemic cardiomyopathy   . Sinus bradycardia     Past Surgical History  Procedure Laterality Date  . Radioactive seed implant  2010    prostate cancer  . Cardiac catheterization  02/04/2003    mildly depressed LV systolic fx EF 70%,WCBJSE coronaries/abdominal aorta/renal arteries.  . US echocardiography  02/06/2012    mild LVH,LA mod. dilated,mild-mod. 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 AI.  Marland Kitchen Nm myocar perf wall motion  02/21/2010    normal  . Esophagogastroduodenoscopy (egd) with propofol N/A 05/05/2014    Procedure: ESOPHAGOGASTRODUODENOSCOPY (EGD) WITH PROPOFOL;  Surgeon: Missy Sabins, MD;  Location: [facility];  Service: Endoscopy;  Laterality: N/A;  ergies:   Allergies  Allergen Reactions  . Cozaar [Losartan Potassium] Other (See Comments)    constipation     Medications: I have reviewed patients current medications as documented in Epic Anti-infectives   Start     Dose/Rate Route Frequency Ordered Stop   05/15/14 1000  azithromycin (ZITHROMAX) tablet 1,200 mg     1,200 mg Oral Weekly 05/13/14 1516     05/14/14 1000  lamiVUDine (EPIVIR) 10 MG/ML solution 100 mg     100 mg Oral Daily 05/13/14 1515     05/14/14 1000  sulfamethoxazole-trimethoprim (BACTRIM DS) 800-160 MG per tablet 1 tablet     1 tablet Oral Once per day on Mon Wed Fri 05/13/14 1515     05/13/14 1600  raltegravir (ISENTRESS) tablet 400 mg     400 mg  Oral 2 times daily 05/13/14 1515     05/13/14 1600  lamiVUDine (EPIVIR) tablet 150 mg     150 mg Oral  Once 05/13/14 1515     05/13/14 1600  zidovudine (RETROVIR) capsule 300 mg     300 mg Oral Every 12 hours 05/13/14 1515     05/08/14 1200  erythromycin 100 mg in sodium chloride 0.9 % 100 mL IVPB     100 mg 100 mL/hr over 60 Minutes Intravenous 3 times per day 05/08/14 1029        Social History:  reports that he quit smoking about 8 years ago. He has never used smokeless tobacco. He reports that he drinks alcohol. He reports that he does not use illicit drugs.  Family History  Problem Relation Age of Onset  . Hypertension Mother   . Thyroid disease Mother   . Cholelithiasis Daughter   . Cholelithiasis Son   . Hypertension Maternal Grandmother   . Diabetes Maternal Grandmother   . Heart attack Neg Hx   . Hyperlipidemia Neg Hx     As in HPI and primary teams notes otherwise 12 point review of systems is negative  Blood pressure 181/84, pulse 88, temperature 98.4 F (36.9 C), temperature source Oral, resp. rate 18, height _0  (1.88 m), weight 194 lb 4.8 oz (88.134 kg), SpO2 97.00%. General: Alert and awake, oriented x3, not in any acute distress. HEENT: anicteric sclera, pupils reactive to light and accommodation, EOMI, oropharynx clear and without exudate or thrush CVS regular rate, normal r,  no murmur rubs or gallops Chest: clear to auscultation bilaterally, no wheezing, rales or rhonchi Abdomen: soft nontender, nondistended, normal bowel sounds, Extremities: no  clubbing or edema noted bilaterally Skin: no rashesm central line clean Neuro: nonfocal, strength and sensation intact   Results for orders placed during the hospital encounter of 05/02/14 (from the past 48 hour(s))  RENAL FUNCTION PANEL     Status: Abnormal   Collection Time    05/12/14  4:50 AM      Result Value Ref Range   Sodium 138  137 - 147 mEq/L   Potassium 5.6 (*) 3.7 - 5.3 mEq/L   Chloride 109  96  - 112 mEq/L   CO2 16 (*) 19 - 32 mEq/L   Glucose, Bld 73  70 - 99 mg/dL   BUN 27 (*) 6 - 23 mg/dL   Creatinine, Ser 4.23 (*) 0.50 - 1.35 mg/dL   Calcium 8.6  8.4 - 10.5 mg/dL   Phosphorus 4.2  2.3 - 4.6 mg/dL   Albumin 2.3 (*) 3.5 - 5.2 g/dL   GFR calc non Af Amer 13 (*) >  90 mL/min   GFR calc Af Amer 15 (*) >90 mL/min   Comment: (NOTE)     The eGFR has been calculated using the CKD EPI equation.     This calculation has not been validated in all clinical situations.     eGFR's persistently <90 mL/min signify possible Chronic Kidney     Disease.  DIC (DISSEMINATED INTRAVASCULAR COAGULATION) PANEL     Status: Abnormal   Collection Time    05/12/14  4:50 AM      Result Value Ref Range   Prothrombin Time 13.7  11.6 - 15.2 seconds   INR 1.07  0.00 - 1.49   aPTT 50 (*) 24 - 37 seconds   Comment:            IF BASELINE aPTT IS ELEVATED,     SUGGEST PATIENT RISK ASSESSMENT     BE USED TO DETERMINE APPROPRIATE     ANTICOAGULANT THERAPY.   Fibrinogen 418  204 - 475 mg/dL   D-Dimer, Quant 1.56 (*) 0.00 - 0.48 ug/mL-FEU   Comment:            AT THE INHOUSE ESTABLISHED CUTOFF     VALUE OF 0.48 ug/mL FEU,     THIS ASSAY HAS BEEN DOCUMENTED     IN THE LITERATURE TO HAVE     A SENSITIVITY AND NEGATIVE     PREDICTIVE VALUE OF AT LEAST     98 TO 99%.  THE TEST RESULT     SHOULD BE CORRELATED WITH     AN ASSESSMENT OF THE CLINICAL     PROBABILITY OF DVT / VTE.   Platelets 162  150 - 400 K/uL   Smear Review NO SCHISTOCYTES SEEN    CBC WITH DIFFERENTIAL     Status: Abnormal   Collection Time    05/12/14  4:50 AM      Result Value Ref Range   WBC 2.5 (*) 4.0 - 10.5 K/uL   RBC 2.98 (*) 4.22 - 5.81 MIL/uL   Hemoglobin 8.8 (*) 13.0 - 17.0 g/dL   HCT 26.6 (*) 39.0 - 52.0 %   MCV 89.3  78.0 - 100.0 fL   MCH 29.5  26.0 - 34.0 pg   MCHC 33.1  30.0 - 36.0 g/dL   RDW 13.5  11.5 - 15.5 %   Platelets 169  150 - 400 K/uL   Neutrophils Relative % 42 (*) 43 - 77 %   Neutro Abs 1.1 (*) 1.7 - 7.7  K/uL   Lymphocytes Relative 32  12 - 46 %   Lymphs Abs 0.8  0.7 - 4.0 K/uL   Monocytes Relative 21 (*) 3 - 12 %   Monocytes Absolute 0.5  0.1 - 1.0 K/uL   Eosinophils Relative 5  0 - 5 %   Eosinophils Absolute 0.1  0.0 - 0.7 K/uL   Basophils Relative 0  0 - 1 %   Basophils Absolute 0.0  0.0 - 0.1 K/uL  HEPATITIS PANEL, ACUTE     Status: None   Collection Time    05/12/14  8:45 PM      Result Value Ref Range   Hepatitis B Surface Ag NEGATIVE  NEGATIVE   HCV Ab NEGATIVE  NEGATIVE   Hep A IgM NON REACTIVE  NON REACTIVE   Hep B C IgM NON REACTIVE  NON REACTIVE   Comment: (NOTE)     High levels of Hepatitis B Core IgM antibody are detectable  during the acute stage of Hepatitis B. This antibody is used     to differentiate current from past HBV infection.     Performed at [facility]  RPR     Status: Abnormal   Collection Time    05/12/14  8:45 PM      Result Value Ref Range   RPR Reactive (*) NON REAC   Comment: ** CONFIRMATORY TESTING TO FOLLOW **     Performed at [facility]  RPR TITER     Status: Abnormal   Collection Time    05/12/14  8:45 PM      Result Value Ref Range   RPR Titer 1:4 (*) NON REACTIVE   Comment: Performed at [facility]  T.PALLIDUM AB, IGG     Status: Abnormal   Collection Time    05/12/14  8:45 PM      Result Value Ref Range   T pallidum Antibodies (TP-PA) >8.00 (*) <0.90 S/CO   Comment: (NOTE)     <0.90 S/CO          NON REACTIVE     0.90-1.00 S/CO      INDETERMINATE     >1.00 S/CO          REACTIVE     The following results were obtained with the IMMULITE 2000 Syphilis     Screen chemiluminescent EIA. Results obtained from other     manufacturers' assay methods may not be used interchangeably.     Performed at [facility] (CD4) COUNT     Status: Abnormal   Collection Time    05/13/14  5:00 AM      Result Value Ref Range   CD4 T Cell Abs 60 (*) 400 - 2700 /uL   CD4 % Helper T Cell 7 (*) 33  - 55 %  BASIC METABOLIC PANEL     Status: Abnormal   Collection Time    05/13/14  5:00 AM      Result Value Ref Range   Sodium 140  137 - 147 mEq/L   Potassium 5.0  3.7 - 5.3 mEq/L   Chloride 110  96 - 112 mEq/L   CO2 17 (*) 19 - 32 mEq/L   Glucose, Bld 71  70 - 99 mg/dL   BUN 28 (*) 6 - 23 mg/dL   Creatinine, Ser 4.23 (*) 0.50 - 1.35 mg/dL   Calcium 8.7  8.4 - 10.5 mg/dL   GFR calc non Af Amer 13 (*) >90 mL/min   GFR calc Af Amer 15 (*) >90 mL/min   Comment: (NOTE)     The eGFR has been calculated using the CKD EPI equation.     This calculation has not been validated in all clinical situations.     eGFR's persistently <90 mL/min signify possible Chronic Kidney     Disease.  RENAL FUNCTION PANEL     Status: Abnormal   Collection Time    05/13/14  5:00 AM      Result Value Ref Range   Sodium 139  137 - 147 mEq/L   Potassium 4.9  3.7 - 5.3 mEq/L   Chloride 109  96 - 112 mEq/L   CO2 16 (*) 19 - 32 mEq/L   Glucose, Bld 72  70 - 99 mg/dL   BUN 28 (*) 6 - 23 mg/dL   Creatinine, Ser 4.22 (*) 0.50 - 1.35 mg/dL   Calcium 8.7  8.4 - 10.5 mg/dL  Phosphorus 4.4  2.3 - 4.6 mg/dL   Albumin 2.4 (*) 3.5 - 5.2 g/dL   GFR calc non Af Amer 13 (*) >90 mL/min   GFR calc Af Amer 15 (*) >90 mL/min   Comment: (NOTE)     The eGFR has been calculated using the CKD EPI equation.     This calculation has not been validated in all clinical situations.     eGFR's persistently <90 mL/min signify possible Chronic Kidney     Disease.   No results found for this basename: sdes, specrequest, cult, reptstatus   Ir Fluoro Guide Cv Line Right  05/12/2014   CLINICAL DATA:  Prostate cancer  EXAM: RIGHT INTERNAL JUGULAR PICC LINE PLACEMENT WITH ULTRASOUND AND FLUOROSCOPIC GUIDANCE  FLUOROSCOPY TIME:  6 seconds.  PROCEDURE: The patient was advised of the possible risks and complications and agreed to undergo the procedure. The patient was then brought to the angiographic suite for the procedure.  The right neck  was prepped with chlorhexidine, draped in the usual sterile fashion using maximum barrier technique (cap and mask, sterile gown, sterile gloves, large sterile sheet, hand hygiene and cutaneous antisepsis) and infiltrated locally with 1% Lidocaine.  Ultrasound demonstrated patency of the right internal jugular vein, and this was documented with an image. Under real-time ultrasound guidance, this vein was accessed with a 21 gauge micropuncture needle and image documentation was performed. A 0.018 wire was introduced in to the vein. Over this, a 5 [place] double lumen Power PICC was advanced to the lower SVC/right atrial junction. Fluoroscopy during the procedure and fluoro spot radiograph confirms appropriate catheter position. The catheter was flushed and covered with asterile dressing.  Complications: None  IMPRESSION: Successful right internal jugular Power PICC line placement with ultrasound and fluoroscopic guidance. The catheter is ready for use.   Electronically Signed   By: Maryclare Bean M.D.   On: 05/12/2014 16:10   Ir US Guide Vasc Access Right  05/12/2014   CLINICAL DATA:  Prostate cancer  EXAM: RIGHT INTERNAL JUGULAR PICC LINE PLACEMENT WITH ULTRASOUND AND FLUOROSCOPIC GUIDANCE  FLUOROSCOPY TIME:  6 seconds.  PROCEDURE: The patient was advised of the possible risks and complications and agreed to undergo the procedure. The patient was then brought to the angiographic suite for the procedure.  The right neck was prepped with chlorhexidine, draped in the usual sterile fashion using maximum barrier technique (cap and mask, sterile gown, sterile gloves, large sterile sheet, hand hygiene and cutaneous antisepsis) and infiltrated locally with 1% Lidocaine.  Ultrasound demonstrated patency of the right internal jugular vein, and this was documented with an image. Under real-time ultrasound guidance, this vein was accessed with a 21 gauge micropuncture needle and image documentation was performed. A 0.018 wire was  introduced in to the vein. Over this, a 5 [place] double lumen Power PICC was advanced to the lower SVC/right atrial junction. Fluoroscopy during the procedure and fluoro spot radiograph confirms appropriate catheter position. The catheter was flushed and covered with asterile dressing.  Complications: None  IMPRESSION: Successful right internal jugular Power PICC line placement with ultrasound and fluoroscopic guidance. The catheter is ready for use.   Electronically Signed   By: Maryclare Bean M.D.   On: 05/12/2014 16:10     Recent Results (from the past 720 hour(s))  URINE CULTURE     Status: None   Collection Time    04/29/14  8:58 AM      Result Value Ref Range Status   Colony  Count NO GROWTH   Final   Organism ID, Bacteria NO GROWTH   Final     Impression/Recommendation  Active Problems:   ADENOCARCINOMA, PROSTATE, GLEASON GRADE 3   HYPERTENSION   Loss of weight   CKD (chronic kidney disease) stage 4, GFR 15-29 ml/min   Hypotension   Severe malnutrition   Protein-calorie malnutrition, severe   ARF (acute renal failure)   Gastroparesis   David [name] is a 73 y.o. male with HIV/AIDS, wasting, AKI, dysphagia.  #1 HIV/AIDS:  I have told patient we can easily get his HIV virus under control rapidly with ARV and reconstitute his immune system.  I will start him on   Isentress 47m BID (Integrase for maximum rapid lowering of VL)  Once daily EPIVIR Renally dosed   And twice daily AZT  Hopefully this will have + effect on his renal fxn as well and if renal fxn picks up we can give him a more consolidated regimen  He needs Bactrim TIW for PCP prophylaxis  He needs weekly azithromycin  For M avium prophylaxis  I spent greater than 60 minutes with the patient including greater than 50% of time in face to face counsel of the patient and in coordination of their care.   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  #3 Renal failure: Renal following  closely  #4 Dysphagia: not clear what to make of this     05/13/2014, 4:07 PM   Thank you so much for this interesting consult  RMendocinofor [phone](pager) [phone](office) 05/13/2014, 4:07 PM  CRhina BrackettDam 05/13/2014, 4:07 PM

## 2014-05-13 NOTE — Progress Notes (Signed)
[facility] PROGRESS NOTE  CHARAN NUZZI [id number] DOB: 1941/02/11 DOA: 05/02/2014 PCP: [name], NP  Brief narrative: 73 year old with history of hypertension, prostate adenocarcinoma stage 1, Gleason grade 3, s/p Radiation seed implants by Dr. Janice Norrie in the past.   Presenting with vomiting, regurgitation, reports of dysphasia and reports of weight loss currently undergoing GI workup. 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   Assessment/Plan:   Loss of weight/dysphagia - GI recommends reglan and signed off 6/9 - Barium swallow/EGD WNL. -Gastric emptying scan with signs of delayed gastric emptying; ?etiology (not a diabetic). -On erythromycin to see if we can "jump-start" his gastric motility. -Continue reglan/PPI. -He doesn't like the  hospital food. He eats what his wife bring him.  -will consider transition Reglan to oral on 6-11.     HIV; new diagnosis:   -RPR 1: 4, reactive, T Pallidum Antibodies more than 8: Will follow up ID recommendation for further test and treatment.  -CD 4 at 38,  -Started on Retrovir, Raltegravir, Lamivudine,  -prophylaxis; Azithromycin, Bactrim.   Hypertension; resume Norvasc and coreg.     Acute on CKD Stage IV -Baseline Cr is 1.9 from 07/2013. -Cr has peaked to 4.23 -Renal US shows medical renal disease and small cysts  -Suspect prerenal azotemia given poor oral intake and GI losses., and now HIV.  -Continue to monitor renal function daily. -Nephrology currently recommending IV fluids, Kayexalate and PO bicarb -DIC panel ordered; no schistocytes, increase D dimer, no chest pain , no hypoxemia.  -Protein electrophoresis; M spike negative.   Stage 1 Prostate CA -Low threshold to rule out other causes of renal obstruction given h/o Prostate Ca. -PSA pending.   Anemia; multifactorial,  indeterminate anemia-stable Receiving IV iron  6/9   Chronic chf  Hypotension: resolved.  - Most likely due to poor oral intake  and dehydration.  - Has resolved with IV fluids.  Code Status: Full Family Communication: Discussed directly with patient Disposition Plan:Home when ready.   Consultants:  GI  Procedures:  Barium swallow  EGD  GES  Antibiotics:  None  HPI/Subjective: No vomiting, still feels at times some difficulty swallowing food. Doesn't like hospital food.    Objective: Filed Vitals:   05/13/14 0645  BP: 181/84  Pulse:   Temp:   Resp:     Intake/Output Summary (Last 24 hours) at 05/13/14 1600 Last data filed at 05/13/14 1100  Gross per 24 hour  Intake 2229.83 ml  Output   1245 ml  Net 984.83 ml   Filed Weights   05/02/14 1643  Weight: 88.134 kg (194 lb 4.8 oz)    Exam:   General:  Patient in no acute distress, alert and awake  Cardiovascular: Regular rate and rhythm, no murmurs or rubs  Respiratory: Clear to auscultation bilaterally no wheezes  Abdomen: Soft, nondistended, nontender  Musculoskeletal: No cyanosis or clubbing   Data Reviewed: Basic Metabolic Panel:  Recent Labs Lab 05/06/14 2147  05/09/14 0540 05/10/14 0508 05/11/14 0445 05/12/14 0450 05/13/14 0500  NA  --   < > 137 136* 136* 138 140  139  K  --   < > 5.7* 6.0* 5.9* 5.6* 5.0  4.9  CL  --   < > 110 108 108 109 110  109  CO2  --   < > 18* 18* 17* 16* 17*  16*  GLUCOSE  --   < > 97 92 74 73 71  72  BUN  --   < >  24* 23 23 27* 28*  28*  CREATININE  --   < > 3.72* 3.98* 4.03* 4.23* 4.23*  4.22*  CALCIUM  --   < > 8.1* 8.5 8.5 8.6 8.7  8.7  MG 1.7  --   --   --   --   --   --   PHOS  --   --   --   --   --  4.2 4.4  < > = values in this interval not displayed. Liver Function Tests:  Recent Labs Lab 05/12/14 0450 05/13/14 0500  ALBUMIN 2.3* 2.4*   No results found for this basename: LIPASE, AMYLASE,  in the last 168 hours No results found for this basename: AMMONIA,  in the last 168 hours CBC:  Recent Labs Lab 05/07/14 0515 05/10/14 0508 05/12/14 0450  WBC 3.6*  3.1* 2.5*  NEUTROABS  --   --  1.1*  HGB 10.0* 9.0* 8.8*  HCT 30.8* 27.9* 26.6*  MCV 89.5 90.3 89.3  PLT 140* 134* 162  169   Cardiac Enzymes: No results found for this basename: CKTOTAL, CKMB, CKMBINDEX, TROPONINI,  in the last 168 hours BNP (last 3 results) No results found for this basename: PROBNP,  in the last 8760 hours CBG: No results found for this basename: GLUCAP,  in the last 168 hours  No results found for this or any previous visit (from the past 240 hour(s)).   Studies: Ir Fluoro Guide Cv Line Right  05/12/2014   CLINICAL DATA:  Prostate cancer  EXAM: RIGHT INTERNAL JUGULAR PICC LINE PLACEMENT WITH ULTRASOUND AND FLUOROSCOPIC GUIDANCE  FLUOROSCOPY TIME:  6 seconds.  PROCEDURE: The patient was advised of the possible risks and complications and agreed to undergo the procedure. The patient was then brought to the angiographic suite for the procedure.  The right neck was prepped with chlorhexidine, draped in the usual sterile fashion using maximum barrier technique (cap and mask, sterile gown, sterile gloves, large sterile sheet, hand hygiene and cutaneous antisepsis) and infiltrated locally with 1% Lidocaine.  Ultrasound demonstrated patency of the right internal jugular vein, and this was documented with an image. Under real-time ultrasound guidance, this vein was accessed with a 21 gauge micropuncture needle and image documentation was performed. A 0.018 wire was introduced in to the vein. Over this, a 5 [place] double lumen Power PICC was advanced to the lower SVC/right atrial junction. Fluoroscopy during the procedure and fluoro spot radiograph confirms appropriate catheter position. The catheter was flushed and covered with asterile dressing.  Complications: None  IMPRESSION: Successful right internal jugular Power PICC line placement with ultrasound and fluoroscopic guidance. The catheter is ready for use.   Electronically Signed   By: Maryclare Bean M.D.   On: 05/12/2014 16:10   Ir  US Guide Vasc Access Right  05/12/2014   CLINICAL DATA:  Prostate cancer  EXAM: RIGHT INTERNAL JUGULAR PICC LINE PLACEMENT WITH ULTRASOUND AND FLUOROSCOPIC GUIDANCE  FLUOROSCOPY TIME:  6 seconds.  PROCEDURE: The patient was advised of the possible risks and complications and agreed to undergo the procedure. The patient was then brought to the angiographic suite for the procedure.  The right neck was prepped with chlorhexidine, draped in the usual sterile fashion using maximum barrier technique (cap and mask, sterile gown, sterile gloves, large sterile sheet, hand hygiene and cutaneous antisepsis) and infiltrated locally with 1% Lidocaine.  Ultrasound demonstrated patency of the right internal jugular vein, and this was documented with an image. Under real-time ultrasound  guidance, this vein was accessed with a 21 gauge micropuncture needle and image documentation was performed. A 0.018 wire was introduced in to the vein. Over this, a 5 [place] double lumen Power PICC was advanced to the lower SVC/right atrial junction. Fluoroscopy during the procedure and fluoro spot radiograph confirms appropriate catheter position. The catheter was flushed and covered with asterile dressing.  Complications: None  IMPRESSION: Successful right internal jugular Power PICC line placement with ultrasound and fluoroscopic guidance. The catheter is ready for use.   Electronically Signed   By: Maryclare Bean M.D.   On: 05/12/2014 16:10    Scheduled Meds: . acetaminophen  500 mg Oral BID  . amLODipine  5 mg Oral Daily  . [START ON 05/15/2014] azithromycin  1,200 mg Oral Weekly  . carvedilol  25 mg Oral BID AC  . dorzolamide  1 drop Both Eyes TID  . erythromycin  100 mg Intravenous 3 times per day  . heparin  5,000 Units Subcutaneous 3 times per day  . [START ON 05/14/2014] lamiVUDine  100 mg Oral Daily  . lamiVUDine  150 mg Oral Once  . latanoprost  1 drop Both Eyes QHS  . metoCLOPramide (REGLAN) injection  10 mg Intravenous 4 times  per day  . neomycin-polymyxin b-dexamethasone  2 drop Both Eyes BID  . pantoprazole  40 mg Oral Daily  . raltegravir  400 mg Oral BID  . sodium bicarbonate  650 mg Oral TID  . sodium chloride  3 mL Intravenous Q12H  . [START ON 05/14/2014] sulfamethoxazole-trimethoprim  1 tablet Oral Once per day on Mon Wed Fri  . terazosin  10 mg Oral QHS  . timolol  1 drop Left Eye QHS  . zidovudine  300 mg Oral Q12H   Continuous Infusions: . sodium chloride 50 mL/hr at 05/13/14 1307    Time spent: 35 minutes  Niel Hummer, MD [facility] (P) [phone]

## 2014-05-13 NOTE — Progress Notes (Addendum)
S: didn'[name] eat breakfast, food made him sick.  Having difficulty starting urine stream O:BP 181/84  Pulse 88  Temp(Src) 98.4 F (36.9 C) (Oral)  Resp 18  Ht 6\' 2"  (1.88 m)  Wt 88.134 kg (194 lb 4.8 oz)  BMI 24.94 kg/m2  SpO2 97%  Intake/Output Summary (Last 24 hours) at 05/13/14 1148 Last data filed at 05/13/14 1100  Gross per 24 hour  Intake 2767.33 ml  Output   1445 ml  Net 1322.33 ml   Weight change:  [id number] and alert CVS:RRR Resp:clear Abd:+ BS NTND  Bladder not palp Ext:no edema NEURO:CNI Ox3 no asterixis   . acetaminophen  500 mg Oral BID  . amLODipine  5 mg Oral Daily  . carvedilol  25 mg Oral BID AC  . dorzolamide  1 drop Both Eyes TID  . erythromycin  100 mg Intravenous 3 times per day  . heparin  5,000 Units Subcutaneous 3 times per day  . latanoprost  1 drop Both Eyes QHS  . metoCLOPramide (REGLAN) injection  10 mg Intravenous 4 times per day  . neomycin-polymyxin b-dexamethasone  2 drop Both Eyes BID  . pantoprazole  40 mg Oral Daily  . sodium bicarbonate  650 mg Oral TID  . sodium chloride  3 mL Intravenous Q12H  . timolol  1 drop Left Eye QHS   Ir Fluoro Guide Cv Line Right  05/12/2014   CLINICAL DATA:  Prostate cancer  EXAM: RIGHT INTERNAL JUGULAR PICC LINE PLACEMENT WITH ULTRASOUND AND FLUOROSCOPIC GUIDANCE  FLUOROSCOPY TIME:  6 seconds.  PROCEDURE: The patient was advised of the possible risks and complications and agreed to undergo the procedure. The patient was then brought to the angiographic suite for the procedure.  The right neck was prepped with chlorhexidine, draped in the usual sterile fashion using maximum barrier technique (cap and mask, sterile gown, sterile gloves, large sterile sheet, hand hygiene and cutaneous antisepsis) and infiltrated locally with 1% Lidocaine.  Ultrasound demonstrated patency of the right internal jugular vein, and this was documented with an image. Under real-time ultrasound guidance, this vein was accessed with a 21  gauge micropuncture needle and image documentation was performed. A 0.018 wire was introduced in to the vein. Over this, a 5 [place] double lumen Power PICC was advanced to the lower SVC/right atrial junction. Fluoroscopy during the procedure and fluoro spot radiograph confirms appropriate catheter position. The catheter was flushed and covered with asterile dressing.  Complications: None  IMPRESSION: Successful right internal jugular Power PICC line placement with ultrasound and fluoroscopic guidance. The catheter is ready for use.   Electronically Signed   By: Maryclare Bean M.D.   On: 05/12/2014 16:10   Ir US Guide Vasc Access Right  05/12/2014   CLINICAL DATA:  Prostate cancer  EXAM: RIGHT INTERNAL JUGULAR PICC LINE PLACEMENT WITH ULTRASOUND AND FLUOROSCOPIC GUIDANCE  FLUOROSCOPY TIME:  6 seconds.  PROCEDURE: The patient was advised of the possible risks and complications and agreed to undergo the procedure. The patient was then brought to the angiographic suite for the procedure.  The right neck was prepped with chlorhexidine, draped in the usual sterile fashion using maximum barrier technique (cap and mask, sterile gown, sterile gloves, large sterile sheet, hand hygiene and cutaneous antisepsis) and infiltrated locally with 1% Lidocaine.  Ultrasound demonstrated patency of the right internal jugular vein, and this was documented with an image. Under real-time ultrasound guidance, this vein was accessed with a 21 gauge micropuncture needle and image documentation was  performed. A 0.018 wire was introduced in to the vein. Over this, a 5 [place] double lumen Power PICC was advanced to the lower SVC/right atrial junction. Fluoroscopy during the procedure and fluoro spot radiograph confirms appropriate catheter position. The catheter was flushed and covered with asterile dressing.  Complications: None  IMPRESSION: Successful right internal jugular Power PICC line placement with ultrasound and fluoroscopic guidance. The  catheter is ready for use.   Electronically Signed   By: Maryclare Bean M.D.   On: 05/12/2014 16:10   BMET    Component Value Date/Time   NA 140 05/13/2014 0500   NA 139 05/13/2014 0500   K 5.0 05/13/2014 0500   K 4.9 05/13/2014 0500   CL 110 05/13/2014 0500   CL 109 05/13/2014 0500   CO2 17* 05/13/2014 0500   CO2 16* 05/13/2014 0500   GLUCOSE 71 05/13/2014 0500   GLUCOSE 72 05/13/2014 0500   BUN 28* 05/13/2014 0500   BUN 28* 05/13/2014 0500   CREATININE 4.23* 05/13/2014 0500   CREATININE 4.22* 05/13/2014 0500   CREATININE 4.90* 04/29/2014 0855   CALCIUM 8.7 05/13/2014 0500   CALCIUM 8.7 05/13/2014 0500   GFRNONAA 13* 05/13/2014 0500   GFRNONAA 13* 05/13/2014 0500   GFRNONAA 11* 04/29/2014 0855   GFRAA 15* 05/13/2014 0500   GFRAA 15* 05/13/2014 0500   GFRAA 13* 04/29/2014 0855   CBC    Component Value Date/Time   WBC 2.5* 05/12/2014 0450   WBC 3.7* 03/06/2013 1138   RBC 2.98* 05/12/2014 0450   RBC 3.55* 03/06/2013 1138   RBC 3.56* 03/06/2013 1138   HGB 8.8* 05/12/2014 0450   HGB 11.1* 03/06/2013 1138   HCT 26.6* 05/12/2014 0450   HCT 33.8* 03/06/2013 1138   PLT 162 05/12/2014 0450   PLT 169 05/12/2014 0450   PLT 177 03/06/2013 1138   MCV 89.3 05/12/2014 0450   MCV 95 03/06/2013 1138   MCH 29.5 05/12/2014 0450   MCH 31.2 03/06/2013 1138   MCHC 33.1 05/12/2014 0450   MCHC 32.8 03/06/2013 1138   RDW 13.5 05/12/2014 0450   RDW 13.4 03/06/2013 1138   LYMPHSABS 0.8 05/12/2014 0450   LYMPHSABS 1.5 03/06/2013 1138   MONOABS 0.5 05/12/2014 0450   EOSABS 0.1 05/12/2014 0450   EOSABS 0.3 03/06/2013 1138   BASOSABS 0.0 05/12/2014 0450   BASOSABS 0.0 03/06/2013 1138     Assessment: 1. Acute on CKD 3, Scr leveling out, UO good.  UA benign except small amt of protein.  No schistocytes 2. Hyperkalemia, improved 3. Presumed metabolic acidosis 4. Iron Def and now has pancytopenia 5. HTN 6. + HIV with low CD4 counts, this prob explains the renal insufficiency and the pancytopenia Plan: 1.  Resume terazosin 2. Check Scr and CBC in  AM   David [name]

## 2014-05-14 DIAGNOSIS — B20 Human immunodeficiency virus [HIV] disease: Principal | ICD-10-CM

## 2014-05-14 DIAGNOSIS — D61818 Other pancytopenia: Secondary | ICD-10-CM

## 2014-05-14 DIAGNOSIS — A529 Late syphilis, unspecified: Secondary | ICD-10-CM

## 2014-05-14 DIAGNOSIS — A539 Syphilis, unspecified: Secondary | ICD-10-CM

## 2014-05-14 DIAGNOSIS — R1310 Dysphagia, unspecified: Secondary | ICD-10-CM

## 2014-05-14 LAB — RENAL FUNCTION PANEL
ALBUMIN: 2.3 g/dL — AB (ref 3.5–5.2)
BUN: 30 mg/dL — AB (ref 6–23)
CALCIUM: 8.7 mg/dL (ref 8.4–10.5)
CO2: 18 mEq/L — ABNORMAL LOW (ref 19–32)
Chloride: 107 mEq/L (ref 96–112)
Creatinine, Ser: 3.92 mg/dL — ABNORMAL HIGH (ref 0.50–1.35)
GFR calc Af Amer: 16 mL/min — ABNORMAL LOW (ref >90)
GFR, EST NON AFRICAN AMERICAN: 14 mL/min — AB (ref >90)
Glucose, Bld: 80 mg/dL (ref 70–99)
PHOSPHORUS: 4.3 mg/dL (ref 2.3–4.6)
Potassium: 4.6 mEq/L (ref 3.7–5.3)
Sodium: 139 mEq/L (ref 137–147)

## 2014-05-14 LAB — CBC
HEMATOCRIT: 25.5 % — AB (ref 39.0–52.0)
Hemoglobin: 8.5 g/dL — ABNORMAL LOW (ref 13.0–17.0)
MCH: 29.7 pg (ref 26.0–34.0)
MCHC: 33.3 g/dL (ref 30.0–36.0)
MCV: 89.2 fL (ref 78.0–100.0)
PLATELETS: 167 10*3/uL (ref 150–400)
RBC: 2.86 MIL/uL — ABNORMAL LOW (ref 4.22–5.81)
RDW: 13.4 % (ref 11.5–15.5)
WBC: 2.3 10*3/uL — ABNORMAL LOW (ref 4.0–10.5)

## 2014-05-14 LAB — PSA: PSA: <0.01 ng/mL — ABNORMAL LOW (ref <=4.00)

## 2014-05-14 MED ORDER — METOCLOPRAMIDE HCL 10 MG PO TABS
10.0000 mg | ORAL_TABLET | Freq: Three times a day (TID) | ORAL | Status: DC
  Administered 2014-05-14 – 2014-05-15 (×4): 10 mg via ORAL
  Filled 2014-05-14 (×7): qty 1

## 2014-05-14 NOTE — Progress Notes (Signed)
[facility] PROGRESS NOTE  David [name] [id number] DOB: [birth year] DOA: 05/02/2014 PCP: [name], NP  Brief narrative: 73 year old with history of hypertension, prostate adenocarcinoma stage 1, Gleason grade 3, s/p Radiation seed implants by Dr. Janice Norrie in the past.   Presenting with vomiting, regurgitation, reports of dysphasia and reports of weight loss currently undergoing GI workup. 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   Assessment/Plan:   Loss of weight/dysphagia - GI recommends reglan and signed off 6/9 - Barium swallow/EGD WNL. -Gastric emptying scan with signs of delayed gastric emptying; ?etiology (not a diabetic). -On erythromycin to see if we can "jump-start" his gastric motility. -Continue reglan/PPI. -He doesn't like the  hospital food. He eats what his wife bring him.  -change reglan to oral.  -Dysphagia improved.     HIV; new diagnosis:   -RPR 1: 4, reactive, T Pallidum Antibodies more than 8: probably prior infection.  -CD 4 at 55,  -Started on Retrovir, Raltegravir, Lamivudine,  -prophylaxis; Azithromycin, Bactrim.   Hypertension; Better controlled on Norvasc and coreg.     Acute on CKD Stage IV -Baseline Cr is 1.9 from 07/2013. -Cr has peaked to 4.23 -Renal US shows medical renal disease and small cysts  -Suspect prerenal azotemia given poor oral intake and GI losses., and now HIV.  -Continue to monitor renal function daily. -Nephrology currently recommending IV fluids, Kayexalate and PO bicarb -DIC panel ordered; no schistocytes, increase D dimer, no chest pain , no hypoxemia.  -Protein electrophoresis; M spike negative.  -renal function stable.   Stage 1 Prostate CA -Low threshold to rule out other causes of renal obstruction given h/o Prostate Ca. -PSA less than 0.01  Anemia; multifactorial,  indeterminate anemia-stable Received  IV iron  6/9  Chronic chf  Hypotension: resolved.  - Most likely due to poor oral  intake and dehydration.  - Has resolved with IV fluids.  Code Status: Full Family Communication: Discussed directly with patient Disposition Plan:Home when ok by ID.    Consultants:  GI  Procedures:  Barium swallow  EGD  GES  Antibiotics:  None  HPI/Subjective: No vomiting. Eating better. He agree to apeak with wife and Dr Tommy Medal about HIV.    Objective: Filed Vitals:   05/14/14 0956  BP: 145/62  Pulse:   Temp:   Resp:     Intake/Output Summary (Last 24 hours) at 05/14/14 1310 Last data filed at 05/14/14 1112  Gross per 24 hour  Intake   2290 ml  Output   1975 ml  Net    315 ml   Filed Weights   05/02/14 1643  Weight: 88.134 kg (194 lb 4.8 oz)    Exam:   General:  Patient in no acute distress, alert and awake  Cardiovascular: Regular rate and rhythm, no murmurs or rubs  Respiratory: Clear to auscultation bilaterally no wheezes  Abdomen: Soft, nondistended, nontender  Musculoskeletal: No cyanosis or clubbing   Data Reviewed: Basic Metabolic Panel:  Recent Labs Lab 05/10/14 0508 05/11/14 0445 05/12/14 0450 05/13/14 0500 05/14/14 0515  NA 136* 136* 138 140  139 139  K 6.0* 5.9* 5.6* 5.0  4.9 4.6  CL 108 108 109 110  109 107  CO2 18* 17* 16* 17*  16* 18*  GLUCOSE 92 74 73 71  72 80  BUN 23 23 27* 28*  28* 30*  CREATININE 3.98* 4.03* 4.23* 4.23*  4.22* 3.92*  CALCIUM 8.5 8.5 8.6 8.7  8.7 8.7  PHOS  --   --  4.2 4.4 4.3   Liver Function Tests:  Recent Labs Lab 05/12/14 0450 05/13/14 0500 05/14/14 0515  ALBUMIN 2.3* 2.4* 2.3*   No results found for this basename: LIPASE, AMYLASE,  in the last 168 hours No results found for this basename: AMMONIA,  in the last 168 hours CBC:  Recent Labs Lab 05/10/14 0508 05/12/14 0450 05/14/14 0500  WBC 3.1* 2.5* 2.3*  NEUTROABS  --  1.1*  --   HGB 9.0* 8.8* 8.5*  HCT 27.9* 26.6* 25.5*  MCV 90.3 89.3 89.2  PLT 134* 162  169 167   Cardiac Enzymes: No results found for this  basename: CKTOTAL, CKMB, CKMBINDEX, TROPONINI,  in the last 168 hours BNP (last 3 results) No results found for this basename: PROBNP,  in the last 8760 hours CBG: No results found for this basename: GLUCAP,  in the last 168 hours  No results found for this or any previous visit (from the past 240 hour(s)).   Studies: Ir Fluoro Guide Cv Line Right  05/12/2014   CLINICAL DATA:  Prostate cancer  EXAM: RIGHT INTERNAL JUGULAR PICC LINE PLACEMENT WITH ULTRASOUND AND FLUOROSCOPIC GUIDANCE  FLUOROSCOPY TIME:  6 seconds.  PROCEDURE: The patient was advised of the possible risks and complications and agreed to undergo the procedure. The patient was then brought to the angiographic suite for the procedure.  The right neck was prepped with chlorhexidine, draped in the usual sterile fashion using maximum barrier technique (cap and mask, sterile gown, sterile gloves, large sterile sheet, hand hygiene and cutaneous antisepsis) and infiltrated locally with 1% Lidocaine.  Ultrasound demonstrated patency of the right internal jugular vein, and this was documented with an image. Under real-time ultrasound guidance, this vein was accessed with a 21 gauge micropuncture needle and image documentation was performed. A 0.018 wire was introduced in to the vein. Over this, a 5 [place] double lumen Power PICC was advanced to the lower SVC/right atrial junction. Fluoroscopy during the procedure and fluoro spot radiograph confirms appropriate catheter position. The catheter was flushed and covered with asterile dressing.  Complications: None  IMPRESSION: Successful right internal jugular Power PICC line placement with ultrasound and fluoroscopic guidance. The catheter is ready for use.   Electronically Signed   By: Maryclare Bean M.D.   On: 05/12/2014 16:10   Ir US Guide Vasc Access Right  05/12/2014   CLINICAL DATA:  Prostate cancer  EXAM: RIGHT INTERNAL JUGULAR PICC LINE PLACEMENT WITH ULTRASOUND AND FLUOROSCOPIC GUIDANCE  FLUOROSCOPY  TIME:  6 seconds.  PROCEDURE: The patient was advised of the possible risks and complications and agreed to undergo the procedure. The patient was then brought to the angiographic suite for the procedure.  The right neck was prepped with chlorhexidine, draped in the usual sterile fashion using maximum barrier technique (cap and mask, sterile gown, sterile gloves, large sterile sheet, hand hygiene and cutaneous antisepsis) and infiltrated locally with 1% Lidocaine.  Ultrasound demonstrated patency of the right internal jugular vein, and this was documented with an image. Under real-time ultrasound guidance, this vein was accessed with a 21 gauge micropuncture needle and image documentation was performed. A 0.018 wire was introduced in to the vein. Over this, a 5 [place] double lumen Power PICC was advanced to the lower SVC/right atrial junction. Fluoroscopy during the procedure and fluoro spot radiograph confirms appropriate catheter position. The catheter was flushed and covered with asterile dressing.  Complications: None  IMPRESSION: Successful right internal jugular Power PICC line placement with ultrasound and  fluoroscopic guidance. The catheter is ready for use.   Electronically Signed   By: Maryclare Bean M.D.   On: 05/12/2014 16:10    Scheduled Meds: . acetaminophen  500 mg Oral BID  . amLODipine  5 mg Oral Daily  . [START ON 05/15/2014] azithromycin  1,200 mg Oral Weekly  . carvedilol  25 mg Oral BID AC  . dorzolamide  1 drop Both Eyes TID  . erythromycin  100 mg Intravenous 3 times per day  . heparin  5,000 Units Subcutaneous 3 times per day  . lamiVUDine  100 mg Oral Daily  . latanoprost  1 drop Both Eyes QHS  . metoCLOPramide  10 mg Oral TID AC & HS  . neomycin-polymyxin b-dexamethasone  2 drop Both Eyes BID  . pantoprazole  40 mg Oral Daily  . raltegravir  400 mg Oral BID  . sodium bicarbonate  650 mg Oral TID  . sodium chloride  3 mL Intravenous Q12H  . sulfamethoxazole-trimethoprim  1  tablet Oral Once per day on Mon Wed Fri  . terazosin  10 mg Oral QHS  . timolol  1 drop Left Eye QHS  . zidovudine  300 mg Oral Q12H   Continuous Infusions: . sodium chloride 50 mL/hr at 05/14/14 1109    Time spent: 30 minutes  Niel Hummer, MD [facility] (P) [phone]

## 2014-05-14 NOTE — Progress Notes (Signed)
PATIENT HAS [employer] MEDICARE HMO with prescription drug coverage as such-          Copay    $6.60    Notes: NON GENERIC STAGE 4          Copay    $5.00           Copay    $17.00            Copay    $45.00    Notes: LEVEL 3          Copay    $100.00           Copay    $95.00           Copay    $28.00            Co-Ins.    97%    Notes: GENERIC STAGE 4           Co-Ins.    95%    Notes: GENERIC STAGE 4   NON GENERIC STAGE 4           Co-Ins.    80%    Notes: MED PART B          Deductible    $200.00

## 2014-05-14 NOTE — Progress Notes (Signed)
[facility]    Subjective: No new complaints   Antibiotics:  Anti-infectives   Start     Dose/Rate Route Frequency Ordered Stop   05/15/14 1000  azithromycin (ZITHROMAX) tablet 1,200 mg     1,200 mg Oral Weekly 05/13/14 1516     05/14/14 1000  lamiVUDine (EPIVIR) 10 MG/ML solution 100 mg     100 mg Oral Daily 05/13/14 1515     05/14/14 1000  sulfamethoxazole-trimethoprim (BACTRIM DS) 800-160 MG per tablet 1 tablet     1 tablet Oral Once per day on Mon Wed Fri 05/13/14 1515     05/13/14 1600  raltegravir (ISENTRESS) tablet 400 mg     400 mg Oral 2 times daily 05/13/14 1515     05/13/14 1600  lamiVUDine (EPIVIR) tablet 150 mg     150 mg Oral  Once 05/13/14 1515 05/13/14 1756   05/13/14 1600  zidovudine (RETROVIR) capsule 300 mg     300 mg Oral Every 12 hours 05/13/14 1515     05/08/14 1200  erythromycin 100 mg in sodium chloride 0.9 % 100 mL IVPB     100 mg 100 mL/hr over 60 Minutes Intravenous 3 times per day 05/08/14 1029        Medications: Scheduled Meds: . acetaminophen  500 mg Oral BID  . amLODipine  5 mg Oral Daily  . [START ON 05/15/2014] azithromycin  1,200 mg Oral Weekly  . carvedilol  25 mg Oral BID AC  . dorzolamide  1 drop Both Eyes TID  . erythromycin  100 mg Intravenous 3 times per day  . heparin  5,000 Units Subcutaneous 3 times per day  . lamiVUDine  100 mg Oral Daily  . latanoprost  1 drop Both Eyes QHS  . metoCLOPramide (REGLAN) injection  10 mg Intravenous 4 times per day  . neomycin-polymyxin b-dexamethasone  2 drop Both Eyes BID  . pantoprazole  40 mg Oral Daily  . raltegravir  400 mg Oral BID  . sodium bicarbonate  650 mg Oral TID  . sodium chloride  3 mL Intravenous Q12H  . sulfamethoxazole-trimethoprim  1 tablet Oral Once per day on Mon Wed Fri  . terazosin  10 mg Oral QHS  . timolol  1 drop Left Eye QHS  . zidovudine  300 mg Oral Q12H   Continuous Infusions: . sodium chloride 50 mL/hr at 05/14/14 1109   PRN  Meds:.ondansetron (ZOFRAN) IV, ondansetron, sodium chloride    Objective: Weight change:   Intake/Output Summary (Last 24 hours) at 05/14/14 1240 Last data filed at 05/14/14 1112  Gross per 24 hour  Intake   2290 ml  Output   1975 ml  Net    315 ml   Blood pressure 145/62, pulse 77, temperature 98.4 F (36.9 C), temperature source Oral, resp. rate 20, height 6\' 2"  (1.88 m), weight 194 lb 4.8 oz (88.134 kg), SpO2 100.00%. Temp:  [97.7 F (36.5 C)-98.4 F (36.9 C)] 98.4 F (36.9 C) (06/11 0526) Pulse Rate:  [70-77] 77 (06/11 0526) Resp:  [18-20] 20 (06/11 0526) BP: (141-180)/(60-84) 145/62 mmHg (06/11 0956) SpO2:  [99 %-100 %] 100 % (06/11 0526)  Physical Exam: General: Alert and awake, oriented x3, not in any acute distress.  HEENT: anicteric sclera, EOMI, oropharynx clear and without exudate or thrush  CVS regular rate, normal r, no murmur rubs or gallops  Chest: clear to auscultation bilaterally, no wheezing, rales or rhonchi  Abdomen: soft nontender, nondistended, normal  bowel sounds,  Extremities: no clubbing or edema noted bilaterally  Skin: no rashesm central line clean  Neuro: nonfocal, strength and sensation intact  CBC:  Recent Labs Lab 05/10/14 0508 05/12/14 0450 05/14/14 0500  HGB 9.0* 8.8* 8.5*  HCT 27.9* 26.6* 25.5*  PLT 134* 162  169 167  INR  --  1.07  --   APTT  --  50*  --      BMET  Recent Labs  05/13/14 0500 05/14/14 0515  NA 140  139 139  K 5.0  4.9 4.6  CL 110  109 107  CO2 17*  16* 18*  GLUCOSE 71  72 80  BUN 28*  28* 30*  CREATININE 4.23*  4.22* 3.92*  CALCIUM 8.7  8.7 8.7     Liver Panel   Recent Labs  05/13/14 0500 05/14/14 0515  ALBUMIN 2.4* 2.3*       Sedimentation Rate No results found for this basename: ESRSEDRATE,  in the last 72 hours C-Reactive Protein No results found for this basename: CRP,  in the last 72 hours  Micro Results: No results found for this or any previous visit (from the past  240 hour(s)).  Studies/Results: Ir Fluoro Guide Cv Line Right  05/12/2014   CLINICAL DATA:  Prostate cancer  EXAM: RIGHT INTERNAL JUGULAR PICC LINE PLACEMENT WITH ULTRASOUND AND FLUOROSCOPIC GUIDANCE  FLUOROSCOPY TIME:  6 seconds.  PROCEDURE: The patient was advised of the possible risks and complications and agreed to undergo the procedure. The patient was then brought to the angiographic suite for the procedure.  The right neck was prepped with chlorhexidine, draped in the usual sterile fashion using maximum barrier technique (cap and mask, sterile gown, sterile gloves, large sterile sheet, hand hygiene and cutaneous antisepsis) and infiltrated locally with 1% Lidocaine.  Ultrasound demonstrated patency of the right internal jugular vein, and this was documented with an image. Under real-time ultrasound guidance, this vein was accessed with a 21 gauge micropuncture needle and image documentation was performed. A 0.018 wire was introduced in to the vein. Over this, a 5 [place] double lumen Power PICC was advanced to the lower SVC/right atrial junction. Fluoroscopy during the procedure and fluoro spot radiograph confirms appropriate catheter position. The catheter was flushed and covered with asterile dressing.  Complications: None  IMPRESSION: Successful right internal jugular Power PICC line placement with ultrasound and fluoroscopic guidance. The catheter is ready for use.   Electronically Signed   By: Maryclare Bean M.D.   On: 05/12/2014 16:10   Ir US Guide Vasc Access Right  05/12/2014   CLINICAL DATA:  Prostate cancer  EXAM: RIGHT INTERNAL JUGULAR PICC LINE PLACEMENT WITH ULTRASOUND AND FLUOROSCOPIC GUIDANCE  FLUOROSCOPY TIME:  6 seconds.  PROCEDURE: The patient was advised of the possible risks and complications and agreed to undergo the procedure. The patient was then brought to the angiographic suite for the procedure.  The right neck was prepped with chlorhexidine, draped in the usual sterile fashion using  maximum barrier technique (cap and mask, sterile gown, sterile gloves, large sterile sheet, hand hygiene and cutaneous antisepsis) and infiltrated locally with 1% Lidocaine.  Ultrasound demonstrated patency of the right internal jugular vein, and this was documented with an image. Under real-time ultrasound guidance, this vein was accessed with a 21 gauge micropuncture needle and image documentation was performed. A 0.018 wire was introduced in to the vein. Over this, a 5 [place] double lumen Power PICC was advanced to the lower SVC/right atrial  junction. Fluoroscopy during the procedure and fluoro spot radiograph confirms appropriate catheter position. The catheter was flushed and covered with asterile dressing.  Complications: None  IMPRESSION: Successful right internal jugular Power PICC line placement with ultrasound and fluoroscopic guidance. The catheter is ready for use.   Electronically Signed   By: Maryclare Bean M.D.   On: 05/12/2014 16:10      Assessment/Plan:  Active Problems:   ADENOCARCINOMA, PROSTATE, GLEASON GRADE 3   HYPERTENSION   Loss of weight   CKD (chronic kidney disease) stage 4, GFR 15-29 ml/min   Hypotension   Severe malnutrition   Protein-calorie malnutrition, severe   ARF (acute renal failure)   Gastroparesis    David [name] is a 73 y.o. 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, genotype pending  Continue   Isentress 400mg  BID (Integrase for maximum rapid lowering of VL)   Once daily EPIVIR Renally dosed   And twice daily AZT   Hopefully this will have + effect on his renal fxn and pancytopenia as well and if renal fxn picks up we can give him a more consolidated regimen   He needs Bactrim TIW for PCP prophylaxis   He needs weekly azithromycin For M avium prophylaxis   Greatly appreciate CASE MANAGEMENT , Olga Coaster help here  I would like to know what his specific copays will be for his ARVs and if  they are high we may be able to use [employer] and I can touch base with Jasmine December in our clniic re that  I spent greater than 40 minutes with the patient including greater than 50% of time in face to face counsel of the patient and in coordination of their care.   I plan on meeting with him and his wife tomorrow and he has asked that I disclose his diagnosis to his wife and help him and his wife cope with the stress of the diagnosis. Certainly his wife will need to be tested for HIV as well as for syphilis.   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   #3 Renal failure: Renal following closely   #4 Dysphagia: not clear what to make of this   #5 Pancytopenia: likley from HIV directly. He could also have undiagnosed MAvium     LOS: 12 days   Alcide Evener 05/14/2014, 12:40 PM

## 2014-05-14 NOTE — Progress Notes (Signed)
Physical Therapy Treatment Patient Details Name: David [name] MRN: [id number] DOB: Jun 20, 1941 Today's Date: 05/14/2014    History of Present Illness 74 y.o. male admitted 5/30 with significant dehydration due to unable to eat food and weight loss with PMHx of CHF, HTN, prostate cancer.    PT Comments    Progressing with mobility. Pt states his legs feel a little weaker. Pt motivated to ambulate more if allowed/has supervision from nursing. Recommend daily mobility with nursing as able.   Follow Up Recommendations  No PT follow up     Equipment Recommendations  None recommended by PT    Recommendations for Other Services       Precautions / Restrictions Precautions Precautions: None Restrictions Weight Bearing Restrictions: No    Mobility  Bed Mobility                  Transfers Overall transfer level: Modified independent                  Ambulation/Gait Ambulation/Gait assistance: Supervision Ambulation Distance (Feet): 400 Feet Assistive device: None Gait Pattern/deviations: Decreased stride length;Decreased step length - right;Decreased step length - left     General Gait Details: slow gait speed. No LOB without device.    Stairs Stairs: Yes Stairs assistance: Min guard Stair Management: Step to pattern;Forwards;One rail Left Number of Stairs: 3 General stair comments: close guard for safety  Wheelchair Mobility    Modified Rankin (Stroke Patients Only)       Balance                                    Cognition Arousal/Alertness: Awake/alert Behavior During Therapy: WFL for tasks assessed/performed Overall Cognitive Status: Within Functional Limits for tasks assessed                      Exercises      General Comments        Pertinent Vitals/Pain Pt denies pain    Home Living                      Prior Function            PT Goals (current goals can now be found in the care plan  section) Progress towards PT goals: Progressing toward goals    Frequency  Min 3X/week    PT Plan Current plan remains appropriate    Co-evaluation             End of Session Equipment Utilized During Treatment: Gait belt Activity Tolerance: Patient tolerated treatment well Patient left: in chair;with call bell/phone within reach     Time: 1421-1436 PT Time Calculation (min): 15 min  Charges:  $Gait Training: 8-22 mins                    G Codes:      Weston Anna, MPT Pager: [phone]

## 2014-05-14 NOTE — Progress Notes (Signed)
BENEFIT CHECK FOR ANTIVIRAL MEDICATION ---05/14/2014 1136 by Camillo Flaming CMA LamiVudime COPAY IS $95.00 Raltegravir COPAY IS $95.00 Zidovudine COPAY IS $45.00 [employer] WILL ONLY COVER 2 A DAY FOR 30 DAY SUPPLY All are antiviral for HIV PER [facility]/ [employer] P#[phone]  CM talked to patient about co pay for his medication - patient stated "OK". CM asked the patient will he be able to fill his prescriptions at discharge. Patient stated "yes". Patient is very stoic, only will answer questions using one word; CM will continue to follow for DCP; B Pennie Rushing [phone]

## 2014-05-14 NOTE — Progress Notes (Signed)
S: No new CO O:BP 148/60  Pulse 77  Temp(Src) 98.4 F (36.9 C) (Oral)  Resp 20  Ht 6\' 2"  (1.88 m)  Wt 88.134 kg (194 lb 4.8 oz)  BMI 24.94 kg/m2  SpO2 100%  Intake/Output Summary (Last 24 hours) at 05/14/14 0908 Last data filed at 05/14/14 0600  Gross per 24 hour  Intake   2410 ml  Output   1775 ml  Net    635 ml   Weight change:  [id number] and alert CVS:RRR Resp:clear Abd:+ BS NTND  Bladder not palp Ext:no edema NEURO:CNI Ox3 no asterixis   . acetaminophen  500 mg Oral BID  . amLODipine  5 mg Oral Daily  . [START ON 05/15/2014] azithromycin  1,200 mg Oral Weekly  . carvedilol  25 mg Oral BID AC  . dorzolamide  1 drop Both Eyes TID  . erythromycin  100 mg Intravenous 3 times per day  . heparin  5,000 Units Subcutaneous 3 times per day  . lamiVUDine  100 mg Oral Daily  . latanoprost  1 drop Both Eyes QHS  . metoCLOPramide (REGLAN) injection  10 mg Intravenous 4 times per day  . neomycin-polymyxin b-dexamethasone  2 drop Both Eyes BID  . pantoprazole  40 mg Oral Daily  . raltegravir  400 mg Oral BID  . sodium bicarbonate  650 mg Oral TID  . sodium chloride  3 mL Intravenous Q12H  . sulfamethoxazole-trimethoprim  1 tablet Oral Once per day on Mon Wed Fri  . terazosin  10 mg Oral QHS  . timolol  1 drop Left Eye QHS  . zidovudine  300 mg Oral Q12H   Ir Fluoro Guide Cv Line Right  05/12/2014   CLINICAL DATA:  Prostate cancer  EXAM: RIGHT INTERNAL JUGULAR PICC LINE PLACEMENT WITH ULTRASOUND AND FLUOROSCOPIC GUIDANCE  FLUOROSCOPY TIME:  6 seconds.  PROCEDURE: The patient was advised of the possible risks and complications and agreed to undergo the procedure. The patient was then brought to the angiographic suite for the procedure.  The right neck was prepped with chlorhexidine, draped in the usual sterile fashion using maximum barrier technique (cap and mask, sterile gown, sterile gloves, large sterile sheet, hand hygiene and cutaneous antisepsis) and infiltrated locally with 1%  Lidocaine.  Ultrasound demonstrated patency of the right internal jugular vein, and this was documented with an image. Under real-time ultrasound guidance, this vein was accessed with a 21 gauge micropuncture needle and image documentation was performed. A 0.018 wire was introduced in to the vein. Over this, a 5 [place] double lumen Power PICC was advanced to the lower SVC/right atrial junction. Fluoroscopy during the procedure and fluoro spot radiograph confirms appropriate catheter position. The catheter was flushed and covered with asterile dressing.  Complications: None  IMPRESSION: Successful right internal jugular Power PICC line placement with ultrasound and fluoroscopic guidance. The catheter is ready for use.   Electronically Signed   By: Maryclare Bean M.D.   On: 05/12/2014 16:10   Ir US Guide Vasc Access Right  05/12/2014   CLINICAL DATA:  Prostate cancer  EXAM: RIGHT INTERNAL JUGULAR PICC LINE PLACEMENT WITH ULTRASOUND AND FLUOROSCOPIC GUIDANCE  FLUOROSCOPY TIME:  6 seconds.  PROCEDURE: The patient was advised of the possible risks and complications and agreed to undergo the procedure. The patient was then brought to the angiographic suite for the procedure.  The right neck was prepped with chlorhexidine, draped in the usual sterile fashion using maximum barrier technique (cap and mask, sterile  gown, sterile gloves, large sterile sheet, hand hygiene and cutaneous antisepsis) and infiltrated locally with 1% Lidocaine.  Ultrasound demonstrated patency of the right internal jugular vein, and this was documented with an image. Under real-time ultrasound guidance, this vein was accessed with a 21 gauge micropuncture needle and image documentation was performed. A 0.018 wire was introduced in to the vein. Over this, a 5 [place] double lumen Power PICC was advanced to the lower SVC/right atrial junction. Fluoroscopy during the procedure and fluoro spot radiograph confirms appropriate catheter position. The catheter  was flushed and covered with asterile dressing.  Complications: None  IMPRESSION: Successful right internal jugular Power PICC line placement with ultrasound and fluoroscopic guidance. The catheter is ready for use.   Electronically Signed   By: Maryclare Bean M.D.   On: 05/12/2014 16:10   BMET    Component Value Date/Time   NA 139 05/14/2014 0515   K 4.6 05/14/2014 0515   CL 107 05/14/2014 0515   CO2 18* 05/14/2014 0515   GLUCOSE 80 05/14/2014 0515   BUN 30* 05/14/2014 0515   CREATININE 3.92* 05/14/2014 0515   CREATININE 4.90* 04/29/2014 0855   CALCIUM 8.7 05/14/2014 0515   GFRNONAA 14* 05/14/2014 0515   GFRNONAA 11* 04/29/2014 0855   GFRAA 16* 05/14/2014 0515   GFRAA 13* 04/29/2014 0855   CBC    Component Value Date/Time   WBC 2.3* 05/14/2014 0500   WBC 3.7* 03/06/2013 1138   RBC 2.86* 05/14/2014 0500   RBC 3.55* 03/06/2013 1138   RBC 3.56* 03/06/2013 1138   HGB 8.5* 05/14/2014 0500   HGB 11.1* 03/06/2013 1138   HCT 25.5* 05/14/2014 0500   HCT 33.8* 03/06/2013 1138   PLT 167 05/14/2014 0500   PLT 177 03/06/2013 1138   MCV 89.2 05/14/2014 0500   MCV 95 03/06/2013 1138   MCH 29.7 05/14/2014 0500   MCH 31.2 03/06/2013 1138   MCHC 33.3 05/14/2014 0500   MCHC 32.8 03/06/2013 1138   RDW 13.4 05/14/2014 0500   RDW 13.4 03/06/2013 1138   LYMPHSABS 0.8 05/12/2014 0450   LYMPHSABS 1.5 03/06/2013 1138   MONOABS 0.5 05/12/2014 0450   EOSABS 0.1 05/12/2014 0450   EOSABS 0.3 03/06/2013 1138   BASOSABS 0.0 05/12/2014 0450   BASOSABS 0.0 03/06/2013 1138     Assessment: 1. Acute on CKD 3 vs progression to CKD4.  I think the HIV is responsible for the worsening renal fx and time will tell if it improves with HIV therapy. 2. Hyperkalemia, improved 3. Presumed metabolic acidosis 4. Iron Def and now has pancytopenia 5. HTN 6. HIV Plan: 1.  Since renal fx is stable and we have a cause for why it has worsened, I will sign off.  Do not resume losartan.  He will need FU with Dr Posey Pronto after DC.  Naara Kelty T

## 2014-05-15 LAB — BASIC METABOLIC PANEL
BUN: 27 mg/dL — ABNORMAL HIGH (ref 6–23)
CHLORIDE: 104 meq/L (ref 96–112)
CO2: 19 meq/L (ref 19–32)
CREATININE: 3.99 mg/dL — AB (ref 0.50–1.35)
Calcium: 8.3 mg/dL — ABNORMAL LOW (ref 8.4–10.5)
GFR calc Af Amer: 16 mL/min — ABNORMAL LOW (ref >90)
GFR calc non Af Amer: 14 mL/min — ABNORMAL LOW (ref >90)
GLUCOSE: 93 mg/dL (ref 70–99)
Potassium: 4.5 mEq/L (ref 3.7–5.3)
Sodium: 135 mEq/L — ABNORMAL LOW (ref 137–147)

## 2014-05-15 MED ORDER — METOCLOPRAMIDE HCL 10 MG PO TABS: 10.0000 mg | ORAL_TABLET | Freq: Three times a day (TID) | ORAL | Status: DC

## 2014-05-15 MED ORDER — RALTEGRAVIR POTASSIUM 400 MG PO TABS: 400.0000 mg | ORAL_TABLET | Freq: Two times a day (BID) | ORAL | Status: DC

## 2014-05-15 MED ORDER — SODIUM BICARBONATE 650 MG PO TABS: 650.0000 mg | ORAL_TABLET | Freq: Three times a day (TID) | ORAL | Status: DC

## 2014-05-15 MED ORDER — SULFAMETHOXAZOLE-TMP DS 800-160 MG PO TABS: 1.0000 | ORAL_TABLET | ORAL | Status: DC

## 2014-05-15 MED ORDER — LAMIVUDINE 10 MG/ML PO SOLN: 100.0000 mg | Freq: Every day | ORAL | Status: DC

## 2014-05-15 MED ORDER — ZIDOVUDINE 100 MG PO CAPS: 300.0000 mg | ORAL_CAPSULE | Freq: Two times a day (BID) | ORAL | Status: DC

## 2014-05-15 MED ORDER — AZITHROMYCIN 600 MG PO TABS: 1200.0000 mg | ORAL_TABLET | ORAL | Status: DC

## 2014-05-15 MED ORDER — PANTOPRAZOLE SODIUM 40 MG PO TBEC: 40.0000 mg | DELAYED_RELEASE_TABLET | Freq: Every day | ORAL | Status: DC

## 2014-05-15 NOTE — Progress Notes (Signed)
Pt discharged home with wife. Pt verbalized understanding d/c instructions, medications, and follow-up appts. Pt denied any complaints at the time of discharge.

## 2014-05-15 NOTE — Progress Notes (Signed)
[facility]    Subjective: No new complaints, I spent nearly entire time counselling the patient and his wife   Antibiotics:  Anti-infectives   Start     Dose/Rate Route Frequency Ordered Stop   05/15/14 1000  azithromycin (ZITHROMAX) tablet 1,200 mg     1,200 mg Oral Weekly 05/13/14 1516     05/15/14 0000  azithromycin (ZITHROMAX) 600 MG tablet     1,200 mg Oral Weekly 05/15/14 1454     05/15/14 0000  lamiVUDine (EPIVIR) 10 MG/ML solution     100 mg Oral Daily 05/15/14 1454     05/15/14 0000  raltegravir (ISENTRESS) 400 MG tablet     400 mg Oral 2 times daily 05/15/14 1454     05/15/14 0000  sulfamethoxazole-trimethoprim (BACTRIM DS) 800-160 MG per tablet     1 tablet Oral 3 times weekly 05/15/14 1454     05/15/14 0000  zidovudine (RETROVIR) 100 MG capsule     300 mg Oral Every 12 hours 05/15/14 1454     05/14/14 1000  lamiVUDine (EPIVIR) 10 MG/ML solution 100 mg     100 mg Oral Daily 05/13/14 1515     05/14/14 1000  sulfamethoxazole-trimethoprim (BACTRIM DS) 800-160 MG per tablet 1 tablet     1 tablet Oral Once per day on Mon Wed Fri 05/13/14 1515     05/13/14 1600  raltegravir (ISENTRESS) tablet 400 mg     400 mg Oral 2 times daily 05/13/14 1515     05/13/14 1600  lamiVUDine (EPIVIR) tablet 150 mg     150 mg Oral  Once 05/13/14 1515 05/13/14 1756   05/13/14 1600  zidovudine (RETROVIR) capsule 300 mg     300 mg Oral Every 12 hours 05/13/14 1515     05/08/14 1200  erythromycin 100 mg in sodium chloride 0.9 % 100 mL IVPB     100 mg 100 mL/hr over 60 Minutes Intravenous 3 times per day 05/08/14 1029        Medications: Scheduled Meds: . acetaminophen  500 mg Oral BID  . amLODipine  5 mg Oral Daily  . azithromycin  1,200 mg Oral Weekly  . carvedilol  25 mg Oral BID AC  . dorzolamide  1 drop Both Eyes TID  . erythromycin  100 mg Intravenous 3 times per day  . heparin  5,000 Units Subcutaneous 3 times per day  . lamiVUDine  100 mg Oral Daily    . latanoprost  1 drop Both Eyes QHS  . metoCLOPramide  10 mg Oral TID AC & HS  . neomycin-polymyxin b-dexamethasone  2 drop Both Eyes BID  . pantoprazole  40 mg Oral Daily  . raltegravir  400 mg Oral BID  . sodium bicarbonate  650 mg Oral TID  . sodium chloride  3 mL Intravenous Q12H  . sulfamethoxazole-trimethoprim  1 tablet Oral Once per day on Mon Wed Fri  . terazosin  10 mg Oral QHS  . timolol  1 drop Left Eye QHS  . zidovudine  300 mg Oral Q12H   Continuous Infusions:   PRN Meds:.ondansetron (ZOFRAN) IV, ondansetron, sodium chloride    Objective: Weight change:   Intake/Output Summary (Last 24 hours) at 05/15/14 1459 Last data filed at 05/15/14 0900  Gross per 24 hour  Intake    860 ml  Output   1275 ml  Net   -415 ml   Blood pressure 129/67, pulse 67, temperature 97.7 F (36.5 C),  temperature source Oral, resp. rate 16, height 6\' 2"  (1.88 m), weight 194 lb 4.8 oz (88.134 kg), SpO2 100.00%. Temp:  [97.7 F (36.5 C)-98.7 F (37.1 C)] 97.7 F (36.5 C) (06/12 1345) Pulse Rate:  [67-77] 67 (06/12 1345) Resp:  [16-20] 16 (06/12 1345) BP: (129-159)/(67-79) 129/67 mmHg (06/12 1345) SpO2:  [98 %-100 %] 100 % (06/12 1345)  Physical Exam: General: Alert and awake, oriented x3, not in any acute distress.  HEENT: anicteric sclera, EOMI, oropharynx clear and without exudate or thrush  CVS regular rate, normal r, no murmur rubs or gallops  Chest: clear to auscultation bilaterally, no wheezing, rales or rhonchi  Abdomen: soft nontender, nondistended, normal bowel sounds,  Extremities: no clubbing or edema noted bilaterally  Skin: no rashesm central line clean  Neuro: nonfocal, strength and sensation intact  CBC:  Recent Labs Lab 05/10/14 0508 05/12/14 0450 05/14/14 0500  HGB 9.0* 8.8* 8.5*  HCT 27.9* 26.6* 25.5*  PLT 134* 162  169 167  INR  --  1.07  --   APTT  --  50*  --      BMET  Recent Labs  05/14/14 0515 05/15/14 1400  NA 139 135*  K 4.6 4.5  CL  107 104  CO2 18* 19  GLUCOSE 80 93  BUN 30* 27*  CREATININE 3.92* 3.99*  CALCIUM 8.7 8.3*     Liver Panel   Recent Labs  05/13/14 0500 05/14/14 0515  ALBUMIN 2.4* 2.3*       Sedimentation Rate No results found for this basename: ESRSEDRATE,  in the last 72 hours C-Reactive Protein No results found for this basename: CRP,  in the last 72 hours  Micro Results: No results found for this or any previous visit (from the past 240 hour(s)).  Studies/Results: No results found.    Assessment/Plan:  Active Problems:   ADENOCARCINOMA, PROSTATE, GLEASON GRADE 3   HYPERTENSION   Loss of weight   CKD (chronic kidney disease) stage 4, GFR 15-29 ml/min   Hypotension   Severe malnutrition   Protein-calorie malnutrition, severe   ARF (acute renal failure)   Gastroparesis   AIDS   Late syphilis    David [name] is a 73 y.o. 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, genotype pending  Continue   Isentress 400mg  BID (Integrase for maximum rapid lowering of VL)   Once daily EPIVIR Renally dosed   And twice daily AZT   Hopefully this will have + effect on his renal fxn and pancytopenia as well and if renal fxn picks up we can give him a more consolidated regimen   He needs Bactrim TIW for PCP prophylaxis   He needs weekly azithromycin For M avium prophylaxis   Greatly appreciate CASE MANAGEMENT , Olga Coaster help here  I WOULD LIKE TO MAKE SURE THAT THE PATIENT HAS ALL OF HIS HIV MEDICINES AVAILABLE AND READY FOR HIM TO PICKUP AT [place].  I KNOW THAT [facility] [place] [facility]WAYS RELIABLY HAS ALL THE [facility] WE PRESCRIBE  His wife tested negative for HIV and other STDS 6 months ago. I would like to retest her for STDS including HIV in my clinic, esp since pt has syphilis (though it appears to be treated syphilis)  I spent greater than 40 minutes with the patient including greater than 50% of time  in face to face counsel of the patient and his wife and in coordination of their care.     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   #3 Renal failure: Renal following closely   #4 Dysphagia: not clear what to make of this   #5 Pancytopenia: likley from HIV directly. He could also have undiagnosed MAvium  I will arrange for HSFU in our clinic next week   LOS: 13 days   Alcide Evener 05/15/2014, 2:59 PM

## 2014-05-15 NOTE — Discharge Summary (Signed)
Physician Discharge Summary  David [name] XBW:[id number] DOB: 03-05-1941 DOA: 05/02/2014  PCP: [name], NP  Admit date: 05/02/2014 Discharge date: 05/15/2014  Time spent: 35 minutes  Recommendations for Outpatient Follow-up:  1. Needs to follow up with nephrologist for further care of CKD, needs B-met.  2. Needs to follow up with Dr Tommy Medal for further care of HIV.   Discharge Diagnoses:    HIV/AIDS   Acute on Chronic renal failure.    Gastroparesis   ADENOCARCINOMA, PROSTATE, GLEASON GRADE 3   HYPERTENSION   Loss of weight   CKD (chronic kidney disease) stage 4, GFR 15-29 ml/min   Hypotension   Protein-calorie malnutrition, severe   ARF (acute renal failure)   Gastroparesis   AIDS   Late syphilis   Discharge Condition: improved, stable.   Diet recommendation: Renal diet.   Filed Weights   05/02/14 1643  Weight: 88.134 kg (194 lb 4.8 oz)    History of present illness:  David [name] is a 73 y.o. male  Who was in the process of being worked up by his PCP for nausea, vomiting and weight loss. He has lost 22 pounds since his February of this year. Reports that he has had vomiting and nausea on and off and feels as if food gets stuck.Marland Kitchen PCP did a chest/abd/pelvis looking for malignancy since he has a h/o prostate CA. Reports that food gets stuck in his esophagus and he vomits it up. He does better with liquids. Occasionally has pain with it. Denies abdominal pain.  Few days ago, patient was found to have worsening renal failure but declined ER visit. He presents today with worsening renal failure (baseline 2- follows with [facility] Course:  Loss of weight/dysphagia  - GI recommends reglan and signed off 6/9  - Barium swallow/EGD WNL.  -Gastric emptying scan with signs of delayed gastric emptying; ?etiology (not a diabetic).  -On erythromycin to see if we can "jump-start" his gastric motility.  -Continue reglan/PPI.  -He doesn't like the hospital  food. He eats what his wife bring him.  -change reglan to oral. Patient eating more.  -Dysphagia improved.   HIV;AIDS new diagnosis:  -RPR 1: 4, reactive, T Pallidum Antibodies more than 8: probably prior infection.  -CD 4 at 42,  -Started on Retrovir, Raltegravir, Lamivudine,  -prophylaxis; Azithromycin, Bactrim.   Hypertension; Better controlled on Norvasc and coreg.  Acute on CKD Stage IV  -Baseline Cr is 1.9 from 07/2013.  -Cr has peaked to 4.23 , Cr has decrease to 3.99 -Renal US shows medical renal disease and small cysts  -Suspect prerenal azotemia given poor oral intake and GI losses., and now HIV.  -Continue to monitor renal function daily.  -Nephrology currently recommending IV fluids, Kayexalate and PO bicarb  -DIC panel ordered; no schistocytes, increase D dimer, no chest pain , no hypoxemia.  -Protein electrophoresis; M spike negative.  -renal function stable.   Stage 1 Prostate CA  -Low threshold to rule out other causes of renal obstruction given h/o Prostate Ca.  -PSA less than 0.01   Anemia, leukopenia; multifactorial,  indeterminate anemia-stable  Received IV iron 6/9  Chronic chf  Hypotension: resolved.  - Most likely due to poor oral intake and dehydration.  - Has resolved with IV fluids.   Procedures: Barium swallow  EGD  GES   Consultations:  Dr Tommy Medal  Nephrology  Discharge Exam: Filed Vitals:   05/15/14 1345  BP: 129/67  Pulse: 67  Temp: 97.7 F (36.5 C)  Resp: 16    General: No distress.  Cardiovascular: S 1, S 2 RRR Respiratory: CTA  Discharge Instructions You were cared for by a hospitalist during your hospital stay. If you have any questions about your discharge medications or the care you received while you were in the hospital after you are discharged, you can call the unit and asked to speak with the hospitalist on call if the hospitalist that took care of you is not available. Once you are discharged, your primary care  physician will handle any further medical issues. Please note that NO REFILLS for any discharge medications will be authorized once you are discharged, as it is imperative that you return to your primary care physician (or establish a relationship with a primary care physician if you do not have one) for your aftercare needs so that they can reassess your need for medications and monitor your lab values.  Discharge Instructions   Diet - low sodium heart healthy    Complete by:  As directed      Increase activity slowly    Complete by:  As directed             Medication List    STOP taking these medications       finasteride 5 MG tablet  Commonly known as:  PROSCAR     losartan-hydrochlorothiazide 100-12.5 MG per tablet  Commonly known as:  HYZAAR      TAKE these medications       acetaminophen 500 MG tablet  Commonly known as:  TYLENOL  Take 500 mg by mouth 2 (two) times daily.     amLODipine 5 MG tablet  Commonly known as:  NORVASC  Take 1 tablet (5 mg total) by mouth daily.     aspirin 325 MG EC tablet  Take 325 mg by mouth daily.     azithromycin 600 MG tablet  Commonly known as:  ZITHROMAX  Take 2 tablets (1,200 mg total) by mouth once a week.     carvedilol 25 MG tablet  Commonly known as:  COREG  Take 1 tablet (25 mg total) by mouth 2 (two) times daily.     erythromycin ophthalmic ointment  Place 1 application into the right eye at bedtime.     lamiVUDine 10 MG/ML solution  Commonly known as:  EPIVIR  Take 10 mLs (100 mg total) by mouth daily.     metoCLOPramide 10 MG tablet  Commonly known as:  REGLAN  Take 1 tablet (10 mg total) by mouth 4 (four) times daily -  before meals and at bedtime.     NEOMYCIN-POLYMYXIN-HC (OPHTH) Susp  Place 2 drops into both eyes 2 (two) times daily.     ondansetron 4 MG disintegrating tablet  Commonly known as:  ZOFRAN ODT  Take 1 tablet (4 mg total) by mouth every 8 (eight) hours as needed for nausea or vomiting.      pantoprazole 40 MG tablet  Commonly known as:  PROTONIX  Take 1 tablet (40 mg total) by mouth daily.     raltegravir 400 MG tablet  Commonly known as:  ISENTRESS  Take 1 tablet (400 mg total) by mouth 2 (two) times daily.     sodium bicarbonate 650 MG tablet  Take 1 tablet (650 mg total) by mouth 3 (three) times daily.     sulfamethoxazole-trimethoprim 800-160 MG per tablet  Commonly known as:  BACTRIM DS  Take 1 tablet by mouth  3 (three) times a week.     terazosin 10 MG capsule  Commonly known as:  HYTRIN  Take 10 mg by mouth at bedtime.     VIGAMOX 0.5 % ophthalmic solution  Generic drug:  moxifloxacin  Place 1 drop into the right eye every 2 (two) hours while awake.     zidovudine 100 MG capsule  Commonly known as:  RETROVIR  Take 3 capsules (300 mg total) by mouth every 12 (twelve) hours.       Allergies  Allergen Reactions  . Cozaar [Losartan Potassium] Other (See Comments)    constipation       Follow-up Information   Follow up with Alcide Evener, MD.   Specialty:  Infectious Diseases   Contact information:   [address] [place] [address] [phone]        The results of significant diagnostics from this hospitalization (including imaging, microbiology, ancillary and laboratory) are listed below for reference.    Significant Diagnostic Studies: Ct Abdomen Pelvis Wo Contrast  04/29/2014   CLINICAL DATA:  30 lb weight loss. History of prostate radiation seeds.  EXAM: CT CHEST, ABDOMEN AND PELVIS WITHOUT CONTRAST  TECHNIQUE: Multidetector CT imaging of the chest, abdomen and pelvis was performed following the standard protocol without IV contrast.  COMPARISON:  CT pelvis 08/02/2011. CT abdomen and pelvis 06/22/2009.  FINDINGS: CT CHEST FINDINGS  Linear areas of scarring within the right upper lobe and right lower lobe. Minimal scarring in the left base. No pulmonary nodules or confluent airspace opacities. No effusions.   Heart is normal size. Aorta is normal caliber. Scattered coronary artery calcifications and aortic calcifications. Small scattered mediastinal and bilateral axillary lymph nodes, none pathologically enlarged. Chest wall soft tissues are unremarkable.  CT ABDOMEN AND PELVIS FINDINGS  Liver, gallbladder, spleen, pancreas, adrenals have an unremarkable unenhanced appearance. Several scattered small hyperdense areas within the kidneys, likely small hemorrhagic cysts. No hydronephrosis.  Aorta and iliac vessels are normal caliber. Radiation seeds noted in the region of the prostate. Urinary bladder is unremarkable.  Stomach, large and small bowel grossly unremarkable. Small scattered retroperitoneal lymph nodes, none pathologically enlarged. No free fluid, free air or adenopathy.  Round sclerotic focus within the left femoral neck, stable since 2012, most compatible with small bone island. Degenerative changes in the hips bilaterally. Partial fusion across the SI joints bilaterally. Degenerative changes in the lower lumbar spine and in the mid to lower thoracic spine.  IMPRESSION: Areas of scarring in the lungs bilaterally, right greater than left.  Coronary artery disease.  No acute findings in the chest, abdomen or pelvis. No explanation for the patient's weight loss.  Radiation seeds in the region the prostate.   Electronically Signed   By: Rolm Baptise M.D.   On: 04/29/2014 11:56   Ct Chest Wo Contrast  04/29/2014   CLINICAL DATA:  30 lb weight loss. History of prostate radiation seeds.  EXAM: CT CHEST, ABDOMEN AND PELVIS WITHOUT CONTRAST  TECHNIQUE: Multidetector CT imaging of the chest, abdomen and pelvis was performed following the standard protocol without IV contrast.  COMPARISON:  CT pelvis 08/02/2011. CT abdomen and pelvis 06/22/2009.  FINDINGS: CT CHEST FINDINGS  Linear areas of scarring within the right upper lobe and right lower lobe. Minimal scarring in the left base. No pulmonary nodules or confluent  airspace opacities. No effusions.  Heart is normal size. Aorta is normal caliber. Scattered coronary artery calcifications and aortic calcifications. Small scattered  mediastinal and bilateral axillary lymph nodes, none pathologically enlarged. Chest wall soft tissues are unremarkable.  CT ABDOMEN AND PELVIS FINDINGS  Liver, gallbladder, spleen, pancreas, adrenals have an unremarkable unenhanced appearance. Several scattered small hyperdense areas within the kidneys, likely small hemorrhagic cysts. No hydronephrosis.  Aorta and iliac vessels are normal caliber. Radiation seeds noted in the region of the prostate. Urinary bladder is unremarkable.  Stomach, large and small bowel grossly unremarkable. Small scattered retroperitoneal lymph nodes, none pathologically enlarged. No free fluid, free air or adenopathy.  Round sclerotic focus within the left femoral neck, stable since 2012, most compatible with small bone island. Degenerative changes in the hips bilaterally. Partial fusion across the SI joints bilaterally. Degenerative changes in the lower lumbar spine and in the mid to lower thoracic spine.  IMPRESSION: Areas of scarring in the lungs bilaterally, right greater than left.  Coronary artery disease.  No acute findings in the chest, abdomen or pelvis. No explanation for the patient's weight loss.  Radiation seeds in the region the prostate.   Electronically Signed   By: Rolm Baptise M.D.   On: 04/29/2014 11:56   Nm Gastric Emptying  05/06/2014   CLINICAL DATA:  Vomiting  EXAM: NUCLEAR MEDICINE GASTRIC EMPTYING SCAN  TECHNIQUE: After oral ingestion of radiolabeled meal, sequential abdominal images were obtained for 120 minutes. Residual percentage of activity remaining within the stomach was calculated at 60 and 120 minutes.  RADIOPHARMACEUTICALS:  2.2 mCi Technetium 99-m labeled sulfur colloid  COMPARISON:  None.  FINDINGS: Expected location of the stomach in the left upper quadrant. Ingested meal empties the  stomach gradually over the course of the study with 74% retention at 60 min and 54% retention at 120 min (normal retention less than 30% at a 120 min).  IMPRESSION: Delayed gastric emptying.   Electronically Signed   By: Abelardo Diesel M.D.   On: 05/06/2014 11:14   Dg Esophagus  05/04/2014   CLINICAL DATA:  Emesis.  Dysphagia.  EXAM: ESOPHOGRAM/BARIUM SWALLOW  TECHNIQUE: Combined double contrast and single contrast examination performed using effervescent crystals, thick barium liquid, and thin barium liquid.  FLUOROSCOPY TIME:  2 min 14 seconds.  COMPARISON:  CT 04/29/2014.  FINDINGS: This is a slightly limited exam as patient could not turn for lateral view. Cervical and thoracic esophagus widely patent. No evidence of obstructing lesion. No reflux demonstrated. Barium tablet passed easily into the stomach.  IMPRESSION: Normal exam.   Electronically Signed   By: Marcello Moores  Register   On: 05/04/2014 12:18   US Renal  05/10/2014   CLINICAL DATA:  Acute renal failure. History of chronic kidney disease. Hypertension. Prostate cancer.  EXAM: RENAL/URINARY TRACT ULTRASOUND COMPLETE  COMPARISON:  Renal ultrasound 09/18/2013.  FINDINGS: Right Kidney:  Length: 11.9 cm. Cortex is diffusely echogenic. No hydronephrosis visualized. Small anechoic lesions with increased through transmission compatible with simple cysts, largest of which is in the medial aspect of the lower pole measuring 1.2 x 1.2 x 1.4 cm.  Left Kidney:  Length: 11.7 cm. Cortex is diffusely echogenic. No hydronephrosis visualized. Small anechoic lesion with increased through transmission compatible with a small cyst in the lower pole.  Bladder:  Appears normal for degree of bladder distention.  IMPRESSION: 1. Echogenic kidneys bilaterally indicative of underlying medical renal disease. 2. No hydronephrosis. 3. Multiple small cysts in the kidneys bilaterally, as above.   Electronically Signed   By: Vinnie Langton M.D.   On: 05/10/2014 12:40   Ir Fluoro  Guide Cv  Line Right  05/12/2014   CLINICAL DATA:  Prostate cancer  EXAM: RIGHT INTERNAL JUGULAR PICC LINE PLACEMENT WITH ULTRASOUND AND FLUOROSCOPIC GUIDANCE  FLUOROSCOPY TIME:  6 seconds.  PROCEDURE: The patient was advised of the possible risks and complications and agreed to undergo the procedure. The patient was then brought to the angiographic suite for the procedure.  The right neck was prepped with chlorhexidine, draped in the usual sterile fashion using maximum barrier technique (cap and mask, sterile gown, sterile gloves, large sterile sheet, hand hygiene and cutaneous antisepsis) and infiltrated locally with 1% Lidocaine.  Ultrasound demonstrated patency of the right internal jugular vein, and this was documented with an image. Under real-time ultrasound guidance, this vein was accessed with a 21 gauge micropuncture needle and image documentation was performed. A 0.018 wire was introduced in to the vein. Over this, a 5 [place] double lumen Power PICC was advanced to the lower SVC/right atrial junction. Fluoroscopy during the procedure and fluoro spot radiograph confirms appropriate catheter position. The catheter was flushed and covered with asterile dressing.  Complications: None  IMPRESSION: Successful right internal jugular Power PICC line placement with ultrasound and fluoroscopic guidance. The catheter is ready for use.   Electronically Signed   By: Maryclare Bean M.D.   On: 05/12/2014 16:10   Ir US Guide Vasc Access Right  05/12/2014   CLINICAL DATA:  Prostate cancer  EXAM: RIGHT INTERNAL JUGULAR PICC LINE PLACEMENT WITH ULTRASOUND AND FLUOROSCOPIC GUIDANCE  FLUOROSCOPY TIME:  6 seconds.  PROCEDURE: The patient was advised of the possible risks and complications and agreed to undergo the procedure. The patient was then brought to the angiographic suite for the procedure.  The right neck was prepped with chlorhexidine, draped in the usual sterile fashion using maximum barrier technique (cap and mask,  sterile gown, sterile gloves, large sterile sheet, hand hygiene and cutaneous antisepsis) and infiltrated locally with 1% Lidocaine.  Ultrasound demonstrated patency of the right internal jugular vein, and this was documented with an image. Under real-time ultrasound guidance, this vein was accessed with a 21 gauge micropuncture needle and image documentation was performed. A 0.018 wire was introduced in to the vein. Over this, a 5 [place] double lumen Power PICC was advanced to the lower SVC/right atrial junction. Fluoroscopy during the procedure and fluoro spot radiograph confirms appropriate catheter position. The catheter was flushed and covered with asterile dressing.  Complications: None  IMPRESSION: Successful right internal jugular Power PICC line placement with ultrasound and fluoroscopic guidance. The catheter is ready for use.   Electronically Signed   By: Maryclare Bean M.D.   On: 05/12/2014 16:10    Microbiology: No results found for this or any previous visit (from the past 240 hour(s)).   Labs: Basic Metabolic Panel:  Recent Labs Lab 05/11/14 0445 05/12/14 0450 05/13/14 0500 05/14/14 0515 05/15/14 1400  NA 136* 138 140  139 139 135*  K 5.9* 5.6* 5.0  4.9 4.6 4.5  CL 108 109 110  109 107 104  CO2 17* 16* 17*  16* 18* 19  GLUCOSE 74 73 71  72 80 93  BUN 23 27* 28*  28* 30* 27*  CREATININE 4.03* 4.23* 4.23*  4.22* 3.92* 3.99*  CALCIUM 8.5 8.6 8.7  8.7 8.7 8.3*  PHOS  --  4.2 4.4 4.3  --    Liver Function Tests:  Recent Labs Lab 05/12/14 0450 05/13/14 0500 05/14/14 0515  ALBUMIN 2.3* 2.4* 2.3*   No results found for this basename:  LIPASE, AMYLASE,  in the last 168 hours No results found for this basename: AMMONIA,  in the last 168 hours CBC:  Recent Labs Lab 05/10/14 0508 05/12/14 0450 05/14/14 0500  WBC 3.1* 2.5* 2.3*  NEUTROABS  --  1.1*  --   HGB 9.0* 8.8* 8.5*  HCT 27.9* 26.6* 25.5*  MCV 90.3 89.3 89.2  PLT 134* 162  169 167   Cardiac Enzymes: No  results found for this basename: CKTOTAL, CKMB, CKMBINDEX, TROPONINI,  in the last 168 hours BNP: BNP (last 3 results) No results found for this basename: PROBNP,  in the last 8760 hours CBG: No results found for this basename: GLUCAP,  in the last 168 hours     Signed:  Niel Hummer A  [facility] 05/15/2014, 2:54 PM

## 2014-05-15 NOTE — Progress Notes (Signed)
Talked to patient about getting his prescriptions filled again; patient requested that I check with [facility] on [address] called and they do not have his medication in stock and was referred to the [facility] on [address] - they specialize in Antiviral meds as Dr Tommy Medal instructed the patient to go; patient stated that he will go there at discharge and get his prescriptions filled; B Pennie Rushing [phone]

## 2014-05-15 NOTE — Progress Notes (Signed)
NUTRITION FOLLOW UP  Intervention:   -Encourage meal intake  -Family to bring in foods pt willing to eat -Will continue to monitor  Nutrition Dx:   Inadequate oral intake related to inability to eat as evidenced by NPO status-ongoing now related to poor appetite and vomiting    Goal:   Pt to meet >/= 90% of their estimated nutrition needs - not met   Monitor:   Weights, labs, intake, vomiting   Assessment:    -Pt endorsed 22 lbs wt loss in past 4 months  -Diet recall indicates pt consuming softer foods and supplements. Would tolerate for 1-2 hours; however would then result in emesis. Tried Ensure supplements; however did not like taste and resulted in vomiting. Was unable to try blander/starchy foods (breads/crackers) as they got caught in throat. Was able to consume some Gatorade for electrolytes  -MD noted pt with AKI, baseline Crt 2.0. Currently 4.81. Receiving IVF  -NPO for MBS to occur today (6/01)  -Pt willing to try [employer] supplements as diet advancement tolerated  -Lipase elevated, MD noted possible for pancreatitis  6/04: -Pt's diet advanced to clear liquid on 6/02 and advanced to heart healthy on 6/03 -MD noted pt with gastric emptying, has been started on Reglan and experienced improvement in n/v -EGD and BS WNL -Pt reported some improvement in appetite, is eating 50% of meals. Noted loss of taste, and disinterest in foods available on current diet order. May benefit from liberalizing diet to encourage PO intake d/t hx of weight loss/poor appetite pta  6/05: -Pt reports not eating much yesterday -Had vomiting this morning after eating oatmeal, didn't eat anything for lunch, only had some orange juice -Not interested in any nutritional supplements or food at this time, only requests orange juice which was provided by RD -GI added proton pump inhibitor this morning for possibility of reflux component  -MD added erythromycin to see if we can "jump-start" his gastric  motility -Per RN, pt not drinking Resource Breeze because pt c/o of it tasting too sweet, will d/c   6/12: -Pt continues with decreased intake. Family bringing in foods from outside sources, PO intake varies from 25-100% -MD continue to recommend oral reglan and erythromycin to assist with nausea/vomiting. Dysphagia improved. -Pt w/new dx of HIV/AIDS, encourage staff to promote PO intake  Height: Ht Readings from Last 1 Encounters:  05/02/14 6' 2"  (1.88 m)    Weight Status:   Wt Readings from Last 1 Encounters:  05/02/14 194 lb 4.8 oz (88.134 kg)    Re-estimated needs:  Kcal: 2200-2400  Protein: 90-105 gram  Fluid: >/=2200 ml/daily     Skin: WDL  Diet Order: General   Intake/Output Summary (Last 24 hours) at 05/15/14 1756 Last data filed at 05/15/14 1500  Gross per 24 hour  Intake    620 ml  Output    901 ml  Net   -281 ml    Last BM: 6/11   Labs:   Recent Labs Lab 05/12/14 0450 05/13/14 0500 05/14/14 0515 05/15/14 1400  NA 138 140  139 139 135*  K 5.6* 5.0  4.9 4.6 4.5  CL 109 110  109 107 104  CO2 16* 17*  16* 18* 19  BUN 27* 28*  28* 30* 27*  CREATININE 4.23* 4.23*  4.22* 3.92* 3.99*  CALCIUM 8.6 8.7  8.7 8.7 8.3*  PHOS 4.2 4.4 4.3  --   GLUCOSE 73 71  72 80 93    CBG (last 3)  No results found for this basename: GLUCAP,  in the last 72 hours  Scheduled Meds: . acetaminophen  500 mg Oral BID  . amLODipine  5 mg Oral Daily  . azithromycin  1,200 mg Oral Weekly  . carvedilol  25 mg Oral BID AC  . dorzolamide  1 drop Both Eyes TID  . erythromycin  100 mg Intravenous 3 times per day  . heparin  5,000 Units Subcutaneous 3 times per day  . lamiVUDine  100 mg Oral Daily  . latanoprost  1 drop Both Eyes QHS  . metoCLOPramide  10 mg Oral TID AC & HS  . neomycin-polymyxin b-dexamethasone  2 drop Both Eyes BID  . pantoprazole  40 mg Oral Daily  . raltegravir  400 mg Oral BID  . sodium bicarbonate  650 mg Oral TID  . sodium chloride  3 mL  Intravenous Q12H  . sulfamethoxazole-trimethoprim  1 tablet Oral Once per day on Mon Wed Fri  . terazosin  10 mg Oral QHS  . timolol  1 drop Left Eye QHS  . zidovudine  300 mg Oral Q12H    Continuous Infusions:    Atlee Abide MS RD LDN Clinical Dietitian [phone]

## 2014-05-16 ENCOUNTER — Telehealth: Payer: Self-pay | Admitting: Infectious Diseases

## 2014-05-16 LAB — GC/CHLAMYDIA PROBE AMP
CT Probe RNA: NEGATIVE
GC Probe RNA: NEGATIVE

## 2014-05-16 NOTE — Telephone Encounter (Signed)
calld by pt's pharmacy that he is receiving pill and liquid which are equivalent to combivir. He is also taking other pills. His rx was changed from components to combivir.

## 2014-05-18 ENCOUNTER — Inpatient Hospital Stay: Payer: Medicare HMO | Admitting: Infectious Disease

## 2014-05-18 LAB — HLA B*5701: HLA B 5701: NEGATIVE

## 2014-05-20 ENCOUNTER — Encounter: Payer: Self-pay | Admitting: Infectious Disease

## 2014-05-20 ENCOUNTER — Ambulatory Visit (INDEPENDENT_AMBULATORY_CARE_PROVIDER_SITE_OTHER): Payer: Medicare HMO | Admitting: Infectious Disease

## 2014-05-20 ENCOUNTER — Telehealth: Payer: Self-pay | Admitting: *Deleted

## 2014-05-20 VITALS — BP 180/91 | HR 77 | Temp 97.8°F | Ht 74.0 in | Wt 183.0 lb

## 2014-05-20 DIAGNOSIS — B20 Human immunodeficiency virus [HIV] disease: Secondary | ICD-10-CM

## 2014-05-20 DIAGNOSIS — R630 Anorexia: Secondary | ICD-10-CM

## 2014-05-20 DIAGNOSIS — R634 Abnormal weight loss: Secondary | ICD-10-CM

## 2014-05-20 DIAGNOSIS — N181 Chronic kidney disease, stage 1: Secondary | ICD-10-CM

## 2014-05-20 DIAGNOSIS — A539 Syphilis, unspecified: Secondary | ICD-10-CM

## 2014-05-20 DIAGNOSIS — N179 Acute kidney failure, unspecified: Secondary | ICD-10-CM

## 2014-05-20 DIAGNOSIS — Z23 Encounter for immunization: Secondary | ICD-10-CM

## 2014-05-20 DIAGNOSIS — D61818 Other pancytopenia: Secondary | ICD-10-CM

## 2014-05-20 DIAGNOSIS — H579 Unspecified disorder of eye and adnexa: Secondary | ICD-10-CM

## 2014-05-20 DIAGNOSIS — R6251 Failure to thrive (child): Secondary | ICD-10-CM

## 2014-05-20 DIAGNOSIS — R627 Adult failure to thrive: Secondary | ICD-10-CM

## 2014-05-20 LAB — CBC WITH DIFFERENTIAL/PLATELET
Basophils Absolute: 0 10*3/uL (ref 0.0–0.1)
Basophils Relative: 1 % (ref 0–1)
EOS ABS: 0.1 10*3/uL (ref 0.0–0.7)
Eosinophils Relative: 3 % (ref 0–5)
HEMATOCRIT: 26.3 % — AB (ref 39.0–52.0)
HEMOGLOBIN: 8.8 g/dL — AB (ref 13.0–17.0)
LYMPHS ABS: 0.9 10*3/uL (ref 0.7–4.0)
Lymphocytes Relative: 26 % (ref 12–46)
MCH: 29.1 pg (ref 26.0–34.0)
MCHC: 33.5 g/dL (ref 30.0–36.0)
MCV: 87.1 fL (ref 78.0–100.0)
MONOS PCT: 11 % (ref 3–12)
Monocytes Absolute: 0.4 10*3/uL (ref 0.1–1.0)
NEUTROS PCT: 59 % (ref 43–77)
Neutro Abs: 2 10*3/uL (ref 1.7–7.7)
Platelets: 219 10*3/uL (ref 150–400)
RBC: 3.02 MIL/uL — AB (ref 4.22–5.81)
RDW: 14.5 % (ref 11.5–15.5)
WBC: 3.4 10*3/uL — ABNORMAL LOW (ref 4.0–10.5)

## 2014-05-20 MED ORDER — MEGESTROL ACETATE 625 MG/5ML PO SUSP: 625.0000 mg | Freq: Every day | ORAL | Status: DC

## 2014-05-20 NOTE — Progress Notes (Signed)
Subjective:    Patient ID: David [name], male    DOB: [birth year], 73 y.o.   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 He was supposed to be dC on oral retrovir 300 bid, Renally dosed 100mg  Epivir daily and Isentress 400mg  bid but there was mixup at dc and pharmacy did nto have liquid epivir so instead he was given Combivir 300mg  bid, and Isentress 400mg  bid which he has been faithfully taking since then (he came to clinic without meds but then went home and brought back). He has several eye drops he is taking from Dr. Johney Maine (ophtho) and has also been to [facility] though his dx of AIDS was not known to them at time they were seeing him.     Review of Systems  Constitutional: Positive for appetite change, fatigue and unexpected weight change. Negative for fever, chills, diaphoresis and activity change.  HENT: Positive for trouble swallowing. Negative for congestion, rhinorrhea, sinus pressure, sneezing and sore throat.   Respiratory: Negative for cough, chest tightness and shortness of breath.   Cardiovascular: Negative for chest pain, palpitations and leg swelling.  Gastrointestinal: Negative for nausea, vomiting, abdominal pain, constipation and abdominal distention.  Genitourinary: Negative for hematuria.  Musculoskeletal: Negative for myalgias.  Skin: Negative for color change, pallor, rash and wound.  Neurological: Negative for dizziness, tremors, weakness and light-headedness.  Hematological: Negative for adenopathy. Does not bruise/bleed easily.  Psychiatric/Behavioral: Negative for behavioral problems, confusion, sleep disturbance, dysphoric mood, decreased concentration and agitation.       Objective:   Physical Exam  Constitutional: He is oriented to person, place, and time. He appears well-developed and well-nourished. No  distress.  HENT:  Head: Normocephalic and atraumatic.  Mouth/Throat: Oropharynx is clear and moist.  Eyes: Conjunctivae and EOM are normal. No scleral icterus.  Neck: Normal range of motion. Neck supple.  Cardiovascular: Normal rate, regular rhythm and normal heart sounds.  Exam reveals no gallop and no friction rub.   No murmur heard. Pulmonary/Chest: Effort normal and breath sounds normal. No respiratory distress. He has no wheezes. He has no rales. He exhibits no tenderness.  Abdominal: Soft. He exhibits no distension.  Musculoskeletal: He exhibits no edema and no tenderness.  Neurological: He is alert and oriented to person, place, and time. He exhibits normal muscle tone. Coordination normal.  Skin: Skin is warm and dry. He is not diaphoretic. No erythema. No pallor.  Psychiatric: His behavior is normal. Judgment and thought content normal. His mood appears anxious.          Assessment & Plan:   HIV: for now continue combivir and isentress since he has those meds but will check labs today. Likley change him to Ziagen 600mg  liquid epivir or epivir 150mg  tablet + bid Isentress , needs OI and MAIvum prophlylaxxis. I spent greater than 40 minutes with the patient including greater than 50% of time in face to face counsel of the patient and in coordination of their care.  Syphilis: Titer was 1:168 in 2000 and sp successful treatment  Eye lesion: needs to be seen by Dr. 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 Hopefully SOME of renal fxn will return. I worry he will need HD ultimately

## 2014-05-20 NOTE — Telephone Encounter (Signed)
[place].  Mr. Brakefield has been approved for $4000.00 effective until 05-20-15.

## 2014-05-21 ENCOUNTER — Telehealth: Payer: Self-pay | Admitting: Family

## 2014-05-21 LAB — BASIC METABOLIC PANEL WITH GFR
BUN: 26 mg/dL — ABNORMAL HIGH (ref 6–23)
CHLORIDE: 110 meq/L (ref 96–112)
CO2: 21 meq/L (ref 19–32)
Calcium: 8.8 mg/dL (ref 8.4–10.5)
Creat: 4.08 mg/dL — ABNORMAL HIGH (ref 0.50–1.35)
GFR, Est African American: 16 mL/min — ABNORMAL LOW
GFR, Est Non African American: 14 mL/min — ABNORMAL LOW
GLUCOSE: 89 mg/dL (ref 70–99)
POTASSIUM: 4.7 meq/L (ref 3.5–5.3)
SODIUM: 140 meq/L (ref 135–145)

## 2014-05-21 LAB — TOXOPLASMA ANTIBODIES- IGG AND  IGM: Toxoplasma Antibody- IgM: <3 IU/mL (ref <8.0)

## 2014-05-21 LAB — HSV 1 ANTIBODY, IGG: HSV 1 Glycoprotein G Ab, IgG: 6.99 IV — ABNORMAL HIGH

## 2014-05-21 LAB — TESTOSTERONE: TESTOSTERONE: 612 ng/dL (ref 300–890)

## 2014-05-21 NOTE — Telephone Encounter (Signed)
Left message for patient to return my call.    ----- Message -----  From: Leeanne Rio, PA-C Sent: 05/16/2014 8:43 AM To: Ronny Flurry, CMA Subject: Hospital Admission Patient discharged from hospital. Can we verify he has scheduled follow-ups with Nephrology and Infectious Disease? If he cannot get in to see them within the next 1-2 weeks, he needs to schedule a follow-up with Melissa.

## 2014-05-22 LAB — HIV-1 GENOTYPR PLUS

## 2014-05-22 NOTE — Telephone Encounter (Signed)
Patient states that he has appointments on 7/1 and 7/8 with infectious disease and nephrology

## 2014-05-23 LAB — CMV (CYTOMEGALOVIRUS) DNA ULTRAQUANT, PCR: CMV DNA Quant: <200 copies/mL

## 2014-05-26 ENCOUNTER — Telehealth: Payer: Self-pay | Admitting: Family

## 2014-05-26 ENCOUNTER — Telehealth: Payer: Self-pay | Admitting: Licensed Clinical Social Worker

## 2014-05-26 DIAGNOSIS — H4011X0 Primary open-angle glaucoma, stage unspecified: Secondary | ICD-10-CM

## 2014-05-26 NOTE — Telephone Encounter (Signed)
Request referral for silverback to Dr Katy Fitch, DX Code [id number]

## 2014-05-26 NOTE — Telephone Encounter (Signed)
Initiated prior authorization for Megace, I faxed the paperwork back to [facility] with required information filled out. [employer] states that it could take 24 to 72 hours for a decision.

## 2014-05-27 NOTE — Telephone Encounter (Signed)
Patients Megace was approved thru 12/03/14

## 2014-06-01 ENCOUNTER — Other Ambulatory Visit (HOSPITAL_COMMUNITY): Payer: Self-pay | Admitting: *Deleted

## 2014-06-02 ENCOUNTER — Encounter (HOSPITAL_COMMUNITY)
Admission: RE | Admit: 2014-06-02 | Discharge: 2014-06-02 | Disposition: A | Discharge status: Home / Self Care | Payer: Medicare HMO | Source: Ambulatory Visit | Attending: Nephrology | Admitting: Nephrology

## 2014-06-02 VITALS — BP 156/100 | HR 58 | Temp 97.9°F | Resp 20 | Ht 74.0 in | Wt 182.0 lb

## 2014-06-02 DIAGNOSIS — N184 Chronic kidney disease, stage 4 (severe): Secondary | ICD-10-CM | POA: Insufficient documentation

## 2014-06-02 LAB — POCT HEMOGLOBIN-HEMACUE: Hemoglobin: 7.1 g/dL — ABNORMAL LOW (ref 13.0–17.0)

## 2014-06-02 MED ORDER — EPOETIN ALFA 20000 UNIT/ML IJ SOLN
20000.0000 [IU] | INTRAMUSCULAR | Status: DC
  Administered 2014-06-02: 20000 [IU] via SUBCUTANEOUS

## 2014-06-02 MED ORDER — SODIUM CHLORIDE 0.9 % IV SOLN
1020.0000 mg | Freq: Once | INTRAVENOUS | Status: AC
  Administered 2014-06-02: 1020 mg via INTRAVENOUS
  Filled 2014-06-02: qty 34

## 2014-06-02 MED ORDER — EPOETIN ALFA 20000 UNIT/ML IJ SOLN
INTRAMUSCULAR | Status: AC
Start: 2014-06-02 — End: 2014-06-02
  Filled 2014-06-02: qty 1

## 2014-06-02 MED ORDER — EPOETIN ALFA 10000 UNIT/ML IJ SOLN
INTRAMUSCULAR | Status: AC
  Administered 2014-06-02: 10000 [IU]
  Filled 2014-06-02: qty 1

## 2014-06-02 NOTE — Discharge Instructions (Signed)
Ferumoxytol injection What is this medicine? FERUMOXYTOL is an iron complex. Iron is used to make healthy red blood cells, which carry oxygen and nutrients throughout the body. This medicine is used to treat iron deficiency anemia in people with chronic kidney disease. This medicine may be used for other purposes; ask your health care provider or pharmacist if you have questions. COMMON BRAND NAME(S): Feraheme What should I tell my health care provider before I take this medicine? They need to know if you have any of these conditions: -anemia not caused by low iron levels -high levels of iron in the blood -magnetic resonance imaging (MRI) test scheduled -an unusual or allergic reaction to iron, other medicines, foods, dyes, or preservatives -pregnant or trying to get pregnant -breast-feeding How should I use this medicine? This medicine is for injection into a vein. It is given by a health care professional in a hospital or clinic setting. Talk to your pediatrician regarding the use of this medicine in children. Special care may be needed. Overdosage: If you think you've taken too much of this medicine contact a poison control center or emergency room at once. Overdosage: If you think you have taken too much of this medicine contact a poison control center or emergency room at once. NOTE: This medicine is only for you. Do not share this medicine with others. What if I miss a dose? It is important not to miss your dose. Call your doctor or health care professional if you are unable to keep an appointment. What may interact with this medicine? This medicine may interact with the following medications: -other iron products This list may not describe all possible interactions. Give your health care provider a list of all the medicines, herbs, non-prescription drugs, or dietary supplements you use. Also tell them if you smoke, drink alcohol, or use illegal drugs. Some items may interact with  your medicine. What should I watch for while using this medicine? Visit your doctor or healthcare professional regularly. Tell your doctor or healthcare professional if your symptoms do not start to get better or if they get worse. You may need blood work done while you are taking this medicine. You may need to follow a special diet. Talk to your doctor. Foods that contain iron include: whole grains/cereals, dried fruits, beans, or peas, leafy green vegetables, and organ meats (liver, kidney). What side effects may I notice from receiving this medicine? Side effects that you should report to your doctor or health care professional as soon as possible: -allergic reactions like skin rash, itching or hives, swelling of the face, lips, or tongue -breathing problems -changes in blood pressure -feeling faint or lightheaded, falls -fever or chills -flushing, sweating, or hot feelings -swelling of the ankles or feet Side effects that usually do not require medical attention (Report these to your doctor or health care professional if they continue or are bothersome.): -diarrhea -headache -nausea, vomiting -stomach pain This list may not describe all possible side effects. Call your doctor for medical advice about side effects. You may report side effects to FDA at 1-800-FDA-1088. Where should I keep my medicine? This drug is given in a hospital or clinic and will not be stored at home. NOTE: This sheet is a summary. It may not cover all possible information. If you have questions about this medicine, talk to your doctor, pharmacist, or health care provider.  2015, Elsevier/Gold Standard. (2012-07-05 15:23:36) Epoetin Alfa injection What is this medicine? EPOETIN ALFA (e POE e  tin AL fa) helps your body make more red blood cells. This medicine is used to treat anemia caused by chronic kidney failure, cancer chemotherapy, or HIV-therapy. It may also be used before surgery if you have anemia. This  medicine may be used for other purposes; ask your health care provider or pharmacist if you have questions. COMMON BRAND NAME(S): Epogen, Procrit What should I tell my health care provider before I take this medicine? They need to know if you have any of these conditions: -blood clotting disorders -cancer patient not on chemotherapy -cystic fibrosis -heart disease, such as angina or heart failure -hemoglobin level of 12 g/dL or greater -high blood pressure -low levels of folate, iron, or vitamin B12 -seizures -an unusual or allergic reaction to erythropoietin, albumin, benzyl alcohol, hamster proteins, other medicines, foods, dyes, or preservatives -pregnant or trying to get pregnant -breast-feeding How should I use this medicine? This medicine is for injection into a vein or under the skin. It is usually given by a health care professional in a hospital or clinic setting. If you get this medicine at home, you will be taught how to prepare and give this medicine. Use exactly as directed. Take your medicine at regular intervals. Do not take your medicine more often than directed. It is important that you put your used needles and syringes in a special sharps container. Do not put them in a trash can. If you do not have a sharps container, call your pharmacist or healthcare provider to get one. Talk to your pediatrician regarding the use of this medicine in children. While this drug may be prescribed for selected conditions, precautions do apply. Overdosage: If you think you have taken too much of this medicine contact a poison control center or emergency room at once. NOTE: This medicine is only for you. Do not share this medicine with others. What if I miss a dose? If you miss a dose, take it as soon as you can. If it is almost time for your next dose, take only that dose. Do not take double or extra doses. What may interact with this medicine? Do not take this medicine with any of the  following medications: -darbepoetin alfa This list may not describe all possible interactions. Give your health care provider a list of all the medicines, herbs, non-prescription drugs, or dietary supplements you use. Also tell them if you smoke, drink alcohol, or use illegal drugs. Some items may interact with your medicine. What should I watch for while using this medicine? Visit your prescriber or health care professional for regular checks on your progress and for the needed blood tests and blood pressure measurements. It is especially important for the doctor to make sure your hemoglobin level is in the desired range, to limit the risk of potential side effects and to give you the best benefit. Keep all appointments for any recommended tests. Check your blood pressure as directed. Ask your doctor what your blood pressure should be and when you should contact him or her. As your body makes more red blood cells, you may need to take iron, folic acid, or vitamin B supplements. Ask your doctor or health care provider which products are right for you. If you have kidney disease continue dietary restrictions, even though this medication can make you feel better. Talk with your doctor or health care professional about the foods you eat and the vitamins that you take. What side effects may I notice from receiving this medicine? Side effects  that you should report to your doctor or health care professional as soon as possible: -allergic reactions like skin rash, itching or hives, swelling of the face, lips, or tongue -breathing problems -changes in vision -chest pain -confusion, trouble speaking or understanding -feeling faint or lightheaded, falls -high blood pressure -muscle aches or pains -pain, swelling, warmth in the leg -rapid weight gain -severe headaches -sudden numbness or weakness of the face, arm or leg -trouble walking, dizziness, loss of balance or coordination -seizures  (convulsions) -swelling of the ankles, feet, hands -unusually weak or tired Side effects that usually do not require medical attention (report to your doctor or health care professional if they continue or are bothersome): -diarrhea -fever, chills (flu-like symptoms) -headaches -nausea, vomiting -redness, stinging, or swelling at site where injected This list may not describe all possible side effects. Call your doctor for medical advice about side effects. You may report side effects to FDA at 1-800-FDA-1088. Where should I keep my medicine? Keep out of the reach of children. Store in a refrigerator between 2 and 8 degrees C (36 and 46 degrees F). Do not freeze or shake. Throw away any unused portion if using a single-dose vial. Multi-dose vials can be kept in the refrigerator for up to 21 days after the initial dose. Throw away unused medicine. NOTE: This sheet is a summary. It may not cover all possible information. If you have questions about this medicine, talk to your doctor, pharmacist, or health care provider.  2015, Elsevier/Gold Standard. (2008-11-03 10:25:44)

## 2014-06-03 ENCOUNTER — Ambulatory Visit: Payer: Commercial Managed Care - HMO

## 2014-06-09 ENCOUNTER — Encounter (HOSPITAL_COMMUNITY)
Admission: RE | Admit: 2014-06-09 | Discharge: 2014-06-09 | Disposition: A | Discharge status: Home / Self Care | Payer: Medicare HMO | Source: Ambulatory Visit | Attending: Nephrology | Admitting: Nephrology

## 2014-06-09 VITALS — BP 141/67 | HR 70 | Temp 97.9°F | Resp 20

## 2014-06-09 DIAGNOSIS — N184 Chronic kidney disease, stage 4 (severe): Secondary | ICD-10-CM

## 2014-06-09 LAB — POCT HEMOGLOBIN-HEMACUE: HEMOGLOBIN: 7 g/dL — AB (ref 13.0–17.0)

## 2014-06-09 LAB — RENAL FUNCTION PANEL
ALBUMIN: 2.8 g/dL — AB (ref 3.5–5.2)
ANION GAP: 12 (ref 5–15)
BUN: 19 mg/dL (ref 6–23)
CHLORIDE: 106 meq/L (ref 96–112)
CO2: 23 mEq/L (ref 19–32)
Calcium: 8.8 mg/dL (ref 8.4–10.5)
Creatinine, Ser: 3.17 mg/dL — ABNORMAL HIGH (ref 0.50–1.35)
GFR, EST AFRICAN AMERICAN: 21 mL/min — AB (ref >90)
GFR, EST NON AFRICAN AMERICAN: 18 mL/min — AB (ref >90)
Glucose, Bld: 135 mg/dL — ABNORMAL HIGH (ref 70–99)
POTASSIUM: 4.4 meq/L (ref 3.7–5.3)
Phosphorus: 3.7 mg/dL (ref 2.3–4.6)
SODIUM: 141 meq/L (ref 137–147)

## 2014-06-09 MED ORDER — EPOETIN ALFA 40000 UNIT/ML IJ SOLN: 30000.0000 [IU] | INTRAMUSCULAR | Status: DC

## 2014-06-09 MED ORDER — EPOETIN ALFA 10000 UNIT/ML IJ SOLN
INTRAMUSCULAR | Status: AC
  Administered 2014-06-09: 10000 [IU] via SUBCUTANEOUS
  Filled 2014-06-09: qty 1

## 2014-06-09 MED ORDER — EPOETIN ALFA 20000 UNIT/ML IJ SOLN
INTRAMUSCULAR | Status: AC
  Administered 2014-06-09: 20000 [IU] via SUBCUTANEOUS
  Filled 2014-06-09: qty 1

## 2014-06-10 ENCOUNTER — Encounter: Payer: Self-pay | Admitting: Infectious Disease

## 2014-06-10 ENCOUNTER — Ambulatory Visit (INDEPENDENT_AMBULATORY_CARE_PROVIDER_SITE_OTHER): Payer: Medicare HMO | Admitting: Infectious Disease

## 2014-06-10 VITALS — BP 155/76 | HR 73 | Temp 97.8°F | Ht 74.0 in | Wt 199.0 lb

## 2014-06-10 DIAGNOSIS — D61818 Other pancytopenia: Secondary | ICD-10-CM

## 2014-06-10 DIAGNOSIS — H579 Unspecified disorder of eye and adnexa: Secondary | ICD-10-CM

## 2014-06-10 DIAGNOSIS — B20 Human immunodeficiency virus [HIV] disease: Secondary | ICD-10-CM

## 2014-06-10 DIAGNOSIS — N184 Chronic kidney disease, stage 4 (severe): Secondary | ICD-10-CM

## 2014-06-10 DIAGNOSIS — A539 Syphilis, unspecified: Secondary | ICD-10-CM

## 2014-06-10 MED ORDER — LAMIVUDINE 150 MG PO TABS: 150.0000 mg | ORAL_TABLET | Freq: Every day | ORAL | Status: DC

## 2014-06-10 MED ORDER — ABACAVIR SULFATE 300 MG PO TABS: 600.0000 mg | ORAL_TABLET | Freq: Every day | ORAL | Status: DC

## 2014-06-10 MED ORDER — RALTEGRAVIR POTASSIUM 400 MG PO TABS: 400.0000 mg | ORAL_TABLET | Freq: Two times a day (BID) | ORAL | Status: DC

## 2014-06-10 NOTE — Progress Notes (Signed)
Subjective:    Patient ID: David [name], male    DOB: 09-Dec-1940, 73 y.o.   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 He was supposed to be dC on oral retrovir 300 bid, Renally dosed 163m Epivir daily and Isentress 4080mbid but there was mixup at dc and pharmacy did nto have liquid epivir so instead he was given Combivir 30031mid, and Isentress 400m15md which he has been faithfully taking since then (he came to clinic without meds but then went home and brought back). He has several eye drops he is taking from Dr. GrosJohney Mainehtho) and [facility] at time they were seeing him.   Last saw him his anemia has worsened to the point where he is now requiring effusions of epogen.  His renal function has not improved substantially on labs checked yesterday.  His HLA [id number] is negative. His SPAP is just been approved to help cover his antiretrovirals.  I'll plan on changing him today to Isentress twice daily, Ziagen 2 300 mg tablets or 600 mg daily along with Epivir at 150 mg tablet daily. [facility] TravDarnelle Maffucciensure that they have the medicines ready for the patient   Review of Systems  Constitutional: Positive for appetite change, fatigue and unexpected weight change. Negative for fever, chills, diaphoresis and activity change.  HENT: Positive for trouble swallowing. Negative for congestion, rhinorrhea, sinus pressure, sneezing and sore throat.   Respiratory: Negative for cough, chest tightness and shortness of breath.   Cardiovascular: Negative for chest pain, palpitations and leg swelling.  Gastrointestinal: Negative for nausea, vomiting, abdominal pain, constipation and abdominal distention.  Genitourinary:  Negative for hematuria.  Musculoskeletal: Negative for myalgias.  Skin: Negative for color change, pallor, rash and wound.  Neurological: Negative for dizziness, tremors, weakness and light-headedness.  Hematological: Negative for adenopathy. Does not bruise/bleed easily.  Psychiatric/Behavioral: Negative for behavioral problems, confusion, sleep disturbance, dysphoric mood, decreased concentration and agitation.       Objective:   Physical Exam  Constitutional: He is oriented to person, place, and time. He appears well-developed and well-nourished. No distress.  HENT:  Head: Normocephalic and atraumatic.  Mouth/Throat: Oropharynx is clear and moist.  Eyes: Conjunctivae and EOM are normal. No scleral icterus.  Neck: Normal range of motion. Neck supple.  Cardiovascular: Normal rate, regular rhythm and normal heart sounds.  Exam reveals no gallop and no friction rub.   No murmur heard. Pulmonary/Chest: Effort normal and breath sounds normal. No respiratory distress. He has no wheezes. He has no rales. He exhibits no tenderness.  Abdominal: Soft. He exhibits no distension.  Musculoskeletal: He exhibits no edema and no tenderness.  Neurological: He is alert and oriented to person, place, and time. He exhibits normal muscle tone. Coordination normal.  Skin: Skin is warm and dry. He is not diaphoretic. No erythema. No pallor.  Psychiatric: His behavior is normal. Judgment and thought content normal. His mood appears anxious.          Assessment & Plan:   HIV: changing him today to Isentress twice daily, Ziagen 2 300 mg tablets or 600 mg daily along with Epivir at 150 mg tablet daily. Check  labs today and recheck in another month  needs OI and MAIvum prophlylaxxis. I spent greater than 40 minutes with the patient including greater than 50% of time in face to face counsel of the patient and in coordination of their care.  Syphilis: Titer was 1:168 in 2000 and sp successful treatment,  follow titers in upcoming months  Eye lesion: needs to be seen by Dr. Johney Maine and/or Phoebe Perch in light of AIDS and risk for CMV, HSV Toxo infection, Syphilis: --check CMV DNA quant blood, toxo abs,   Poor appetite, weakness: check testosterone, rx megace  Pancytopenia; likely due to HIV , renal disease and AZT  therefore likely change to ABC based regimen

## 2014-06-10 NOTE — Patient Instructions (Signed)
YOUR NEW REGIMEN IS:  ISENTRESS 400MG  PINK PILL TWICE DAILY  ZIAGEN (ABACAVIR) 300MG  YELLOW TABLET, TAKE TWO TABLETS = 600MG  ONCE DAILY  AND  EPIVIR (LAMIVUDINE) 150MG  WHITE TABLET TAKE ONCE DAILY

## 2014-06-11 ENCOUNTER — Encounter: Payer: Self-pay | Admitting: *Deleted

## 2014-06-11 LAB — T-HELPER CELL (CD4) - (RCID CLINIC ONLY)
CD4 % Helper T Cell: 11 % — ABNORMAL LOW (ref 33–55)
CD4 T Cell Abs: 80 /uL — ABNORMAL LOW (ref 400–2700)

## 2014-06-12 LAB — HIV-1 RNA ULTRAQUANT REFLEX TO GENTYP+
HIV 1 RNA Quant: 396 copies/mL — ABNORMAL HIGH (ref <20)
HIV-1 RNA Quant, Log: 2.6 {Log} — ABNORMAL HIGH (ref <1.30)

## 2014-06-16 ENCOUNTER — Encounter (HOSPITAL_COMMUNITY)
Admission: RE | Admit: 2014-06-16 | Discharge: 2014-06-16 | Disposition: A | Discharge status: Home / Self Care | Payer: Medicare HMO | Source: Ambulatory Visit | Attending: Nephrology | Admitting: Nephrology

## 2014-06-16 VITALS — BP 173/80 | HR 64 | Temp 98.0°F

## 2014-06-16 DIAGNOSIS — N184 Chronic kidney disease, stage 4 (severe): Secondary | ICD-10-CM

## 2014-06-16 LAB — POCT HEMOGLOBIN-HEMACUE: Hemoglobin: 9.9 g/dL — ABNORMAL LOW (ref 13.0–17.0)

## 2014-06-16 MED ORDER — EPOETIN ALFA 20000 UNIT/ML IJ SOLN
INTRAMUSCULAR | Status: AC
  Administered 2014-06-16: 20000 [IU] via SUBCUTANEOUS
  Filled 2014-06-16: qty 1

## 2014-06-16 MED ORDER — EPOETIN ALFA 10000 UNIT/ML IJ SOLN
INTRAMUSCULAR | Status: AC
  Administered 2014-06-16: 10000 [IU] via SUBCUTANEOUS
  Filled 2014-06-16: qty 1

## 2014-06-16 MED ORDER — EPOETIN ALFA 40000 UNIT/ML IJ SOLN: 30000.0000 [IU] | INTRAMUSCULAR | Status: DC

## 2014-06-23 ENCOUNTER — Encounter (HOSPITAL_COMMUNITY)
Admission: RE | Admit: 2014-06-23 | Discharge: 2014-06-23 | Disposition: A | Discharge status: Home / Self Care | Payer: Medicare HMO | Source: Ambulatory Visit | Attending: Nephrology | Admitting: Nephrology

## 2014-06-23 VITALS — BP 172/76 | HR 58 | Resp 20

## 2014-06-23 DIAGNOSIS — N184 Chronic kidney disease, stage 4 (severe): Secondary | ICD-10-CM | POA: Diagnosis not present

## 2014-06-23 LAB — FERRITIN: FERRITIN: 1221 ng/mL — AB (ref 22–322)

## 2014-06-23 LAB — POCT HEMOGLOBIN-HEMACUE: HEMOGLOBIN: 9.6 g/dL — AB (ref 13.0–17.0)

## 2014-06-23 LAB — RENAL FUNCTION PANEL
Albumin: 3 g/dL — ABNORMAL LOW (ref 3.5–5.2)
Anion gap: 14 (ref 5–15)
BUN: 27 mg/dL — ABNORMAL HIGH (ref 6–23)
CALCIUM: 8.8 mg/dL (ref 8.4–10.5)
CO2: 21 mEq/L (ref 19–32)
CREATININE: 2.91 mg/dL — AB (ref 0.50–1.35)
Chloride: 107 mEq/L (ref 96–112)
GFR calc Af Amer: 23 mL/min — ABNORMAL LOW (ref >90)
GFR calc non Af Amer: 20 mL/min — ABNORMAL LOW (ref >90)
GLUCOSE: 129 mg/dL — AB (ref 70–99)
PHOSPHORUS: 4.3 mg/dL (ref 2.3–4.6)
Potassium: 4.3 mEq/L (ref 3.7–5.3)
SODIUM: 142 meq/L (ref 137–147)

## 2014-06-23 LAB — IRON AND TIBC
Iron: 53 ug/dL (ref 42–135)
SATURATION RATIOS: 26 % (ref 20–55)
TIBC: 206 ug/dL — ABNORMAL LOW (ref 215–435)
UIBC: 153 ug/dL (ref 125–400)

## 2014-06-23 MED ORDER — EPOETIN ALFA 10000 UNIT/ML IJ SOLN
INTRAMUSCULAR | Status: AC
  Administered 2014-06-23: 10000 [IU] via SUBCUTANEOUS
  Filled 2014-06-23: qty 1

## 2014-06-23 MED ORDER — EPOETIN ALFA 40000 UNIT/ML IJ SOLN: 30000.0000 [IU] | INTRAMUSCULAR | Status: DC

## 2014-06-23 MED ORDER — EPOETIN ALFA 20000 UNIT/ML IJ SOLN
INTRAMUSCULAR | Status: AC
  Administered 2014-06-23: 20000 [IU] via SUBCUTANEOUS
  Filled 2014-06-23: qty 1

## 2014-06-23 NOTE — Discharge Instructions (Signed)

## 2014-06-30 ENCOUNTER — Encounter (HOSPITAL_COMMUNITY)
Admission: RE | Admit: 2014-06-30 | Discharge: 2014-06-30 | Disposition: A | Discharge status: Home / Self Care | Payer: Medicare HMO | Source: Ambulatory Visit | Attending: Nephrology | Admitting: Nephrology

## 2014-06-30 VITALS — BP 157/77 | HR 60 | Temp 98.2°F | Resp 20

## 2014-06-30 DIAGNOSIS — N184 Chronic kidney disease, stage 4 (severe): Secondary | ICD-10-CM | POA: Diagnosis not present

## 2014-06-30 LAB — POCT HEMOGLOBIN-HEMACUE: HEMOGLOBIN: 8.8 g/dL — AB (ref 13.0–17.0)

## 2014-06-30 MED ORDER — EPOETIN ALFA 40000 UNIT/ML IJ SOLN: 30000.0000 [IU] | INTRAMUSCULAR | Status: DC

## 2014-06-30 MED ORDER — EPOETIN ALFA 10000 UNIT/ML IJ SOLN
INTRAMUSCULAR | Status: AC
  Administered 2014-06-30: 10000 [IU] via SUBCUTANEOUS
  Filled 2014-06-30: qty 1

## 2014-06-30 MED ORDER — EPOETIN ALFA 20000 UNIT/ML IJ SOLN
INTRAMUSCULAR | Status: AC
  Administered 2014-06-30: 20000 [IU] via SUBCUTANEOUS
  Filled 2014-06-30: qty 1

## 2014-07-07 ENCOUNTER — Encounter (HOSPITAL_COMMUNITY)
Admission: RE | Admit: 2014-07-07 | Discharge: 2014-07-07 | Disposition: A | Discharge status: Home / Self Care | Payer: Medicare HMO | Source: Ambulatory Visit | Attending: Nephrology | Admitting: Nephrology

## 2014-07-07 VITALS — BP 162/72 | HR 59 | Temp 97.7°F | Resp 20

## 2014-07-07 DIAGNOSIS — N184 Chronic kidney disease, stage 4 (severe): Secondary | ICD-10-CM | POA: Diagnosis present

## 2014-07-07 LAB — POCT HEMOGLOBIN-HEMACUE: Hemoglobin: 11.4 g/dL — ABNORMAL LOW (ref 13.0–17.0)

## 2014-07-07 MED ORDER — EPOETIN ALFA 10000 UNIT/ML IJ SOLN
INTRAMUSCULAR | Status: AC
  Administered 2014-07-07: 10000 [IU] via SUBCUTANEOUS
  Filled 2014-07-07: qty 1

## 2014-07-07 MED ORDER — EPOETIN ALFA 40000 UNIT/ML IJ SOLN: 30000.0000 [IU] | INTRAMUSCULAR | Status: DC

## 2014-07-07 MED ORDER — EPOETIN ALFA 20000 UNIT/ML IJ SOLN
INTRAMUSCULAR | Status: AC
  Administered 2014-07-07: 20000 [IU] via SUBCUTANEOUS
  Filled 2014-07-07: qty 1

## 2014-07-08 ENCOUNTER — Other Ambulatory Visit: Payer: Commercial Managed Care - HMO

## 2014-07-08 DIAGNOSIS — Z113 Encounter for screening for infections with a predominantly sexual mode of transmission: Secondary | ICD-10-CM

## 2014-07-08 DIAGNOSIS — B20 Human immunodeficiency virus [HIV] disease: Secondary | ICD-10-CM

## 2014-07-08 LAB — CBC WITH DIFFERENTIAL/PLATELET
BASOS ABS: 0 10*3/uL (ref 0.0–0.1)
BASOS PCT: 0 % (ref 0–1)
EOS PCT: 5 % (ref 0–5)
Eosinophils Absolute: 0.2 10*3/uL (ref 0.0–0.7)
HCT: 37.3 % — ABNORMAL LOW (ref 39.0–52.0)
Hemoglobin: 12 g/dL — ABNORMAL LOW (ref 13.0–17.0)
Lymphocytes Relative: 47 % — ABNORMAL HIGH (ref 12–46)
Lymphs Abs: 1.7 10*3/uL (ref 0.7–4.0)
MCH: 33.3 pg (ref 26.0–34.0)
MCHC: 32.2 g/dL (ref 30.0–36.0)
MCV: 103.6 fL — AB (ref 78.0–100.0)
Monocytes Absolute: 0.5 10*3/uL (ref 0.1–1.0)
Monocytes Relative: 13 % — ABNORMAL HIGH (ref 3–12)
Neutro Abs: 1.3 10*3/uL — ABNORMAL LOW (ref 1.7–7.7)
Neutrophils Relative %: 35 % — ABNORMAL LOW (ref 43–77)
PLATELETS: 207 10*3/uL (ref 150–400)
RBC: 3.6 MIL/uL — ABNORMAL LOW (ref 4.22–5.81)
RDW: 20.4 % — AB (ref 11.5–15.5)
WBC: 3.7 10*3/uL — ABNORMAL LOW (ref 4.0–10.5)

## 2014-07-08 LAB — COMPLETE METABOLIC PANEL WITH GFR
ALT: 11 U/L (ref 0–53)
AST: 17 U/L (ref 0–37)
Albumin: 3.3 g/dL — ABNORMAL LOW (ref 3.5–5.2)
Alkaline Phosphatase: 59 U/L (ref 39–117)
BILIRUBIN TOTAL: 0.4 mg/dL (ref 0.2–1.2)
BUN: 26 mg/dL — ABNORMAL HIGH (ref 6–23)
CO2: 21 mEq/L (ref 19–32)
CREATININE: 2.76 mg/dL — AB (ref 0.50–1.35)
Calcium: 8.6 mg/dL (ref 8.4–10.5)
Chloride: 110 mEq/L (ref 96–112)
GFR, EST AFRICAN AMERICAN: 25 mL/min — AB
GFR, Est Non African American: 22 mL/min — ABNORMAL LOW
Glucose, Bld: 117 mg/dL — ABNORMAL HIGH (ref 70–99)
Potassium: 4.3 mEq/L (ref 3.5–5.3)
Sodium: 140 mEq/L (ref 135–145)
Total Protein: 6.7 g/dL (ref 6.0–8.3)

## 2014-07-08 LAB — RPR: RPR Ser Ql: REACTIVE — AB

## 2014-07-08 LAB — RPR TITER: RPR Titer: 1:1 {titer}

## 2014-07-08 NOTE — Addendum Note (Signed)
Addended by: Lorne Skeens D on: 07/08/2014 11:05 AM   Modules accepted: Orders

## 2014-07-08 NOTE — Addendum Note (Signed)
Addended byMarlis Edelson on: 07/08/2014 11:16 AM   Modules accepted: Orders

## 2014-07-09 LAB — FLUORESCENT TREPONEMAL AB(FTA)-IGG-BLD: Fluorescent Treponemal ABS: REACTIVE — AB

## 2014-07-09 LAB — T-HELPER CELL (CD4) - (RCID CLINIC ONLY)
CD4 % Helper T Cell: 9 % — ABNORMAL LOW (ref 33–55)
CD4 T Cell Abs: 170 /uL — ABNORMAL LOW (ref 400–2700)

## 2014-07-10 LAB — HIV-1 RNA QUANT-NO REFLEX-BLD
HIV 1 RNA QUANT: 99 {copies}/mL — AB (ref <20)
HIV-1 RNA QUANT, LOG: 2 {Log} — AB (ref <1.30)

## 2014-07-14 ENCOUNTER — Encounter (HOSPITAL_COMMUNITY)
Admission: RE | Admit: 2014-07-14 | Discharge: 2014-07-14 | Disposition: A | Discharge status: Home / Self Care | Payer: Medicare HMO | Source: Ambulatory Visit | Attending: Nephrology | Admitting: Nephrology

## 2014-07-14 VITALS — BP 180/79 | HR 66 | Temp 97.9°F | Resp 20

## 2014-07-14 DIAGNOSIS — N184 Chronic kidney disease, stage 4 (severe): Secondary | ICD-10-CM

## 2014-07-14 LAB — POCT HEMOGLOBIN-HEMACUE: Hemoglobin: 13.2 g/dL (ref 13.0–17.0)

## 2014-07-14 MED ORDER — EPOETIN ALFA 40000 UNIT/ML IJ SOLN: 30000.0000 [IU] | INTRAMUSCULAR | Status: DC

## 2014-07-20 ENCOUNTER — Telehealth: Payer: Self-pay | Admitting: *Deleted

## 2014-07-20 DIAGNOSIS — B20 Human immunodeficiency virus [HIV] disease: Secondary | ICD-10-CM

## 2014-07-20 MED ORDER — LAMIVUDINE 150 MG PO TABS: 150.0000 mg | ORAL_TABLET | Freq: Every day | ORAL | Status: DC

## 2014-07-20 MED ORDER — ABACAVIR SULFATE 300 MG PO TABS: 600.0000 mg | ORAL_TABLET | Freq: Every day | ORAL | Status: DC

## 2014-07-20 MED ORDER — RALTEGRAVIR POTASSIUM 400 MG PO TABS: 400.0000 mg | ORAL_TABLET | Freq: Two times a day (BID) | ORAL | Status: DC

## 2014-07-20 NOTE — Telephone Encounter (Signed)
ADAP Application 

## 2014-07-28 ENCOUNTER — Encounter (HOSPITAL_COMMUNITY)
Admission: RE | Admit: 2014-07-28 | Discharge: 2014-07-28 | Disposition: A | Discharge status: Home / Self Care | Payer: Medicare HMO | Source: Ambulatory Visit | Attending: Nephrology | Admitting: Nephrology

## 2014-07-28 VITALS — BP 169/79 | HR 57 | Temp 97.4°F | Resp 20

## 2014-07-28 DIAGNOSIS — N184 Chronic kidney disease, stage 4 (severe): Secondary | ICD-10-CM

## 2014-07-28 LAB — RENAL FUNCTION PANEL
ANION GAP: 10 (ref 5–15)
Albumin: 3.2 g/dL — ABNORMAL LOW (ref 3.5–5.2)
BUN: 25 mg/dL — ABNORMAL HIGH (ref 6–23)
CO2: 24 meq/L (ref 19–32)
Calcium: 9.2 mg/dL (ref 8.4–10.5)
Chloride: 105 mEq/L (ref 96–112)
Creatinine, Ser: 2.68 mg/dL — ABNORMAL HIGH (ref 0.50–1.35)
GFR, EST AFRICAN AMERICAN: 26 mL/min — AB (ref >90)
GFR, EST NON AFRICAN AMERICAN: 22 mL/min — AB (ref >90)
GLUCOSE: 131 mg/dL — AB (ref 70–99)
PHOSPHORUS: 3.7 mg/dL (ref 2.3–4.6)
POTASSIUM: 4.2 meq/L (ref 3.7–5.3)
SODIUM: 139 meq/L (ref 137–147)

## 2014-07-28 LAB — POCT HEMOGLOBIN-HEMACUE: Hemoglobin: 13.8 g/dL (ref 13.0–17.0)

## 2014-07-28 LAB — FERRITIN: Ferritin: 785 ng/mL — ABNORMAL HIGH (ref 22–322)

## 2014-07-28 MED ORDER — EPOETIN ALFA 40000 UNIT/ML IJ SOLN: 30000.0000 [IU] | INTRAMUSCULAR | Status: DC

## 2014-07-29 ENCOUNTER — Ambulatory Visit (INDEPENDENT_AMBULATORY_CARE_PROVIDER_SITE_OTHER): Payer: Medicare HMO | Admitting: Infectious Disease

## 2014-07-29 ENCOUNTER — Encounter: Payer: Self-pay | Admitting: Infectious Disease

## 2014-07-29 VITALS — BP 177/84 | HR 60 | Temp 97.8°F | Ht 74.0 in | Wt 192.0 lb

## 2014-07-29 DIAGNOSIS — D61818 Other pancytopenia: Secondary | ICD-10-CM

## 2014-07-29 DIAGNOSIS — Z23 Encounter for immunization: Secondary | ICD-10-CM

## 2014-07-29 DIAGNOSIS — F3289 Other specified depressive episodes: Secondary | ICD-10-CM

## 2014-07-29 DIAGNOSIS — F32A Depression, unspecified: Secondary | ICD-10-CM

## 2014-07-29 DIAGNOSIS — B20 Human immunodeficiency virus [HIV] disease: Secondary | ICD-10-CM

## 2014-07-29 DIAGNOSIS — F329 Major depressive disorder, single episode, unspecified: Secondary | ICD-10-CM

## 2014-07-29 DIAGNOSIS — N179 Acute kidney failure, unspecified: Secondary | ICD-10-CM

## 2014-07-29 DIAGNOSIS — H579 Unspecified disorder of eye and adnexa: Secondary | ICD-10-CM

## 2014-07-29 LAB — IRON AND TIBC
Iron: 97 ug/dL (ref 42–135)
SATURATION RATIOS: 49 % (ref 20–55)
TIBC: 197 ug/dL — ABNORMAL LOW (ref 215–435)
UIBC: 100 ug/dL — ABNORMAL LOW (ref 125–400)

## 2014-07-29 MED ORDER — SULFAMETHOXAZOLE-TMP DS 800-160 MG PO TABS: 1.0000 | ORAL_TABLET | ORAL | Status: DC

## 2014-07-29 NOTE — Patient Instructions (Signed)
You are doing a GREAT JOB!!  We need to make sure you renew your SPAP today  RTC in 3 months  Consider making appt with one of our counselors

## 2014-07-29 NOTE — Progress Notes (Signed)
Subjective:    Patient ID: David [name], male    DOB: 10-31-1941, 73 y.o.   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 He was supposed to be dC on oral retrovir 300 bid, Renally dosed 170m Epivir daily and Isentress 4052mbid but there was mixup at dc and pharmacy did nto have liquid epivir so instead he was given Combivir 30090mid, and Isentress 400m49md which he has been faithfully taking since then (he came to clinic without meds but then went home and brought back). He has several eye drops he is taking from Dr. GrosJohney Mainehtho) and [facility] at time they were seeing him.   Last saw him his anemia has worsened to the point where he is now requiring effusions of epogen.  His renal function has not improved substantially on labs checked yesterday.  His HLA [id number] is negative. His SPAP is just been approved to help cover his antiretrovirals.  I'll changed him at one of his last visits to Isentress twice daily, Ziagen 2 300 mg tablets or 600 mg daily along with Epivir at 150 mg tablet daily.  His virus is coming down nicely under control and CD4 count is at 170.   Lab Results  Component Value Date   HIV1RNAQUANT 99* 07/08/2014   Lab Results  Component Value Date   CD4TABS 170* 07/08/2014   CD4TABS 80* 06/10/2014   CD4TABS 60* 05/13/2014     Discontinue azithromycin. He is a bit down because his wife no longer have sexual relationships with him. I've offered a counselor and also an antidepressant to help offered also to talk to both him and his wife if he wants to bring her again to appointment with him.  Review of Systems  Constitutional: Negative for fever, chills, diaphoresis, activity change, appetite change, fatigue and unexpected weight change.  HENT: Negative for  congestion, rhinorrhea, sinus pressure, sneezing, sore throat and trouble swallowing.   Respiratory: Negative for cough, chest tightness and shortness of breath.   Cardiovascular: Negative for chest pain, palpitations and leg swelling.  Gastrointestinal: Negative for nausea, vomiting, abdominal pain, constipation and abdominal distention.  Genitourinary: Negative for hematuria.  Musculoskeletal: Negative for myalgias.  Skin: Negative for color change, pallor, rash and wound.  Neurological: Negative for dizziness, tremors, weakness and light-headedness.  Hematological: Negative for adenopathy. Does not bruise/bleed easily.  Psychiatric/Behavioral: Positive for dysphoric mood. Negative for behavioral problems, confusion, sleep disturbance, decreased concentration and agitation.       Objective:   Physical Exam  Constitutional: He is oriented to person, place, and time. He appears well-developed and well-nourished. No distress.  HENT:  Head: Normocephalic and atraumatic.  Mouth/Throat: Oropharynx is clear and moist.  Eyes: Conjunctivae and EOM are normal. No scleral icterus.  Neck: Normal range of motion. Neck supple.  Cardiovascular: Normal rate, regular rhythm and normal heart sounds.  Exam reveals no gallop and no friction rub.   No murmur heard. Pulmonary/Chest: Effort normal and breath sounds normal. No respiratory distress. He has no wheezes. He has no rales. He exhibits no tenderness.  Abdominal: Soft. He exhibits no distension.  Musculoskeletal: He exhibits no edema and no tenderness.  Neurological: He is alert and oriented to person, place, and time.  He exhibits normal muscle tone. Coordination normal.  Skin: Skin is warm and dry. He is not diaphoretic. No erythema. No pallor.  Psychiatric: His behavior is normal. Judgment and thought content normal. His mood appears anxious.          Assessment & Plan:   HIV: Continue Isentress twice daily, Ziagen 2 300 mg tablets or 600  mg daily along with Epivir at 150 mg tablet daily. Check labs in another 3 months make sure it that he has coverage for his medications  Discontinue Mycobacterium avium prophylaxis  Continue 3 times weekly Bactrim for PCP prophylaxis needs OI and MAIvum prophlylaxxis.   I spent greater than 25 minutes with the patient including greater than 50% of time in face to face counsel of the patient and in coordination of their care.  Syphilis: Titer was 1:168 in 2000 and sp successful treatment, follow titers in upcoming months  Eye lesion: ? Has he been seen again by Dr. 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

## 2014-08-11 ENCOUNTER — Encounter (HOSPITAL_COMMUNITY)
Admission: RE | Admit: 2014-08-11 | Discharge: 2014-08-11 | Disposition: A | Discharge status: Home / Self Care | Payer: Medicare HMO | Source: Ambulatory Visit | Attending: Nephrology | Admitting: Nephrology

## 2014-08-11 VITALS — BP 159/83 | HR 60 | Temp 97.6°F | Resp 20

## 2014-08-11 DIAGNOSIS — N184 Chronic kidney disease, stage 4 (severe): Secondary | ICD-10-CM | POA: Diagnosis present

## 2014-08-11 LAB — POCT HEMOGLOBIN-HEMACUE: Hemoglobin: 12.1 g/dL — ABNORMAL LOW (ref 13.0–17.0)

## 2014-08-11 MED ORDER — EPOETIN ALFA 40000 UNIT/ML IJ SOLN: 30000.0000 [IU] | INTRAMUSCULAR | Status: DC

## 2014-08-20 ENCOUNTER — Telehealth: Payer: Self-pay | Admitting: Cardiovascular Disease

## 2014-08-20 ENCOUNTER — Encounter: Payer: Self-pay | Admitting: Cardiovascular Disease

## 2014-08-21 NOTE — Telephone Encounter (Signed)
Closed encounter °

## 2014-08-25 ENCOUNTER — Encounter (HOSPITAL_COMMUNITY)
Admission: RE | Admit: 2014-08-25 | Discharge: 2014-08-25 | Disposition: A | Discharge status: Home / Self Care | Payer: Medicare HMO | Source: Ambulatory Visit | Attending: Nephrology | Admitting: Nephrology

## 2014-08-25 VITALS — BP 178/90 | HR 55 | Temp 97.7°F | Resp 20

## 2014-08-25 DIAGNOSIS — N184 Chronic kidney disease, stage 4 (severe): Secondary | ICD-10-CM | POA: Diagnosis not present

## 2014-08-25 LAB — RENAL FUNCTION PANEL
Albumin: 3.5 g/dL (ref 3.5–5.2)
Anion gap: 11 (ref 5–15)
BUN: 27 mg/dL — ABNORMAL HIGH (ref 6–23)
CHLORIDE: 104 meq/L (ref 96–112)
CO2: 25 meq/L (ref 19–32)
CREATININE: 2.5 mg/dL — AB (ref 0.50–1.35)
Calcium: 9.4 mg/dL (ref 8.4–10.5)
GFR calc non Af Amer: 24 mL/min — ABNORMAL LOW (ref >90)
GFR, EST AFRICAN AMERICAN: 28 mL/min — AB (ref >90)
Glucose, Bld: 93 mg/dL (ref 70–99)
Phosphorus: 3.9 mg/dL (ref 2.3–4.6)
Potassium: 3.8 mEq/L (ref 3.7–5.3)
Sodium: 140 mEq/L (ref 137–147)

## 2014-08-25 LAB — IRON AND TIBC
Iron: 84 ug/dL (ref 42–135)
Saturation Ratios: 41 % (ref 20–55)
TIBC: 204 ug/dL — AB (ref 215–435)
UIBC: 120 ug/dL — ABNORMAL LOW (ref 125–400)

## 2014-08-25 LAB — FERRITIN: FERRITIN: 829 ng/mL — AB (ref 22–322)

## 2014-08-25 LAB — POCT HEMOGLOBIN-HEMACUE: HEMOGLOBIN: 13.3 g/dL (ref 13.0–17.0)

## 2014-08-25 MED ORDER — EPOETIN ALFA 40000 UNIT/ML IJ SOLN: 30000.0000 [IU] | INTRAMUSCULAR | Status: DC

## 2014-09-02 ENCOUNTER — Other Ambulatory Visit: Payer: Self-pay | Admitting: Infectious Disease

## 2014-09-08 ENCOUNTER — Encounter (HOSPITAL_COMMUNITY)
Admission: RE | Admit: 2014-09-08 | Discharge: 2014-09-08 | Disposition: A | Discharge status: Home / Self Care | Payer: Medicare HMO | Source: Ambulatory Visit | Attending: Nephrology | Admitting: Nephrology

## 2014-09-08 VITALS — BP 158/52 | HR 58 | Temp 97.6°F | Resp 20

## 2014-09-08 DIAGNOSIS — N184 Chronic kidney disease, stage 4 (severe): Secondary | ICD-10-CM | POA: Insufficient documentation

## 2014-09-08 LAB — POCT HEMOGLOBIN-HEMACUE: HEMOGLOBIN: 11 g/dL — AB (ref 13.0–17.0)

## 2014-09-08 MED ORDER — EPOETIN ALFA 10000 UNIT/ML IJ SOLN
INTRAMUSCULAR | Status: AC
  Administered 2014-09-08: 10000 [IU] via SUBCUTANEOUS
  Filled 2014-09-08: qty 1

## 2014-09-08 MED ORDER — EPOETIN ALFA 40000 UNIT/ML IJ SOLN: 30000.0000 [IU] | INTRAMUSCULAR | Status: DC

## 2014-09-08 MED ORDER — EPOETIN ALFA 20000 UNIT/ML IJ SOLN
INTRAMUSCULAR | Status: AC
  Administered 2014-09-08: 20000 [IU] via SUBCUTANEOUS
  Filled 2014-09-08: qty 1

## 2014-09-09 ENCOUNTER — Encounter: Payer: Self-pay | Admitting: Cardiovascular Disease

## 2014-09-09 ENCOUNTER — Ambulatory Visit (INDEPENDENT_AMBULATORY_CARE_PROVIDER_SITE_OTHER): Payer: Commercial Managed Care - HMO | Admitting: Cardiovascular Disease

## 2014-09-09 VITALS — BP 171/83 | HR 68 | Resp 16 | Ht 74.0 in | Wt 192.9 lb

## 2014-09-09 DIAGNOSIS — I420 Dilated cardiomyopathy: Secondary | ICD-10-CM

## 2014-09-09 DIAGNOSIS — I10 Essential (primary) hypertension: Secondary | ICD-10-CM

## 2014-09-09 DIAGNOSIS — I429 Cardiomyopathy, unspecified: Secondary | ICD-10-CM

## 2014-09-09 DIAGNOSIS — N184 Chronic kidney disease, stage 4 (severe): Secondary | ICD-10-CM

## 2014-09-09 MED ORDER — AMLODIPINE BESYLATE 5 MG PO TABS: 5.0000 mg | ORAL_TABLET | Freq: Every day | ORAL | Status: DC

## 2014-09-09 NOTE — Patient Instructions (Signed)
Dr Sallyanne Kuster has recommended making the following medication changes:  START Amlodipine 5 mg - take 1 tablet daily  Your physician recommends that you schedule a follow-up appointment in 3 months.

## 2014-09-10 ENCOUNTER — Encounter: Payer: Self-pay | Admitting: Cardiovascular Disease

## 2014-09-10 NOTE — Progress Notes (Signed)
Patient ID: David [name], male   DOB: 1941/04/20, 73 y.o.   MRN: [id number]     Reason for office visit Hypertension, nonischemic dilated cardiomyopathy and, chronic kidney disease  Mr. Derbyshire continues to be well compensated from a hemodynamic standpoint. He has a long-standing history of nonischemic cardiomyopathy with an ejection fraction that was as low as 30% in the past, but has recently increased to 45-50%. He has no symptoms of congestive heart failure. During evaluation for unexplained weight loss who was found to have a new diagnosis of HIV/AIDS with a CD4 count of only 60. He has started antiretroviral therapy.  His blood pressure has been consistently elevated despite the fact that he reports compliance with high-dose carvedilol.   Allergies  Allergen Reactions  . Cozaar [Losartan Potassium] Other (See Comments)    constipation    Current Outpatient Prescriptions  Medication Sig Dispense Refill  . abacavir (ZIAGEN) 300 MG tablet Take 2 tablets (600 mg total) by mouth daily.  60 tablet  11  . acetaminophen (TYLENOL) 500 MG tablet Take 500 mg by mouth 2 (two) times daily.      Marland Kitchen aspirin 325 MG EC tablet Take 325 mg by mouth daily.        . carvedilol (COREG) 25 MG tablet Take 1 tablet (25 mg total) by mouth 2 (two) times daily.  180 tablet  3  . dorzolamide-timolol (COSOPT) 22.3-6.8 MG/ML ophthalmic solution       . erythromycin ophthalmic ointment Place 1 application into the right eye at bedtime.      . ISENTRESS 400 MG tablet TAKE 1 TABLET BY MOUTH TWICE DAILY  60 tablet  2  . lamiVUDine (EPIVIR) 150 MG tablet Take 1 tablet (150 mg total) by mouth daily.  30 tablet  11  . latanoprost (XALATAN) 0.005 % ophthalmic solution       . NEOMYCIN-POLYMYXIN-HC, OPHTH, SUSP Place 2 drops into both eyes 2 (two) times daily.      Marland Kitchen terazosin (HYTRIN) 10 MG capsule Take 10 mg by mouth at bedtime.       Marland Kitchen amLODipine (NORVASC) 5 MG tablet Take 1 tablet (5 mg total) by mouth daily.  30  tablet  11   No current facility-administered medications for this visit.    Past Medical History  Diagnosis Date  . Hypertension     followed by [facility] (Dr Dani Gobble Aleana Fifita)  . Hyperlipidemia   . Cancer     hx of prostate; s/p radioactive seed implant 10/2009 Dr Janice Norrie  . 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 History of cardiac cath 2004    negative for CAD  . History of myocardial perfusion scan 02/2010    negative for coronary insufficiency (LVEF 27%)  . Anemia, iron deficiency 11/15/2011  . Nonischemic cardiomyopathy   . Sinus bradycardia   . HIV infection     Past Surgical History  Procedure Laterality Date  . Radioactive seed implant  2010    prostate cancer  . Cardiac catheterization  02/04/2003    mildly depressed LV systolic fx EF [id number] coronaries/abdominal aorta/renal arteries.  . US echocardiography  02/06/2012    mild LVH,LA mod. dilated,mild-mod. 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 AI.  Marland Kitchen Nm myocar perf wall motion  02/21/2010    normal  . Esophagogastroduodenoscopy (egd) with propofol N/A 05/05/2014    Procedure: ESOPHAGOGASTRODUODENOSCOPY (EGD) WITH PROPOFOL;  Surgeon: Missy Sabins, MD;  Location: [facility];  Service: Endoscopy;  Laterality: N/A;    Family History  Problem Relation Age of Onset  . Hypertension Mother   . Thyroid disease Mother   . Cholelithiasis Daughter   . Cholelithiasis Son   . Hypertension Maternal Grandmother   . Diabetes Maternal Grandmother   . Heart attack Neg Hx   . Hyperlipidemia Neg Hx     History   Social History  . Marital Status: Married    Spouse Name: N/A    Number of Children: 2  . Years of Education: N/A   Occupational History  . retired    Social History Main Topics  . Smoking status: Former Smoker    Quit date: 12/04/2005  . Smokeless tobacco: Never Used  . Alcohol Use: Yes     Comment:  once a week-wine  . Drug Use: No  . Sexual Activity: Yes   Other Topics Concern  . Not on file   Social History Narrative   Stays active at home   Regular exercise: no    Review of systems: The patient specifically denies any chest pain at rest or with exertion, dyspnea at rest or with exertion, orthopnea, paroxysmal nocturnal dyspnea, syncope, palpitations, focal neurological deficits, intermittent claudication, lower extremity edema, unexplained weight gain, cough, hemoptysis or wheezing.  The patient also denies abdominal pain, nausea, vomiting, dysphagia, diarrhea, constipation, polyuria, polydipsia, dysuria, hematuria, frequency, urgency, abnormal bleeding or bruising, fever, chills, unexpected weight changes, mood swings, change in skin or hair texture, change in voice quality, auditory or visual problems, allergic reactions or rashes, new musculoskeletal complaints other than usual "aches and pains".   PHYSICAL EXAM BP 171/83  Pulse 68  Resp 16  Ht 6\' 2"  (1.88 m)  Wt 87.499 kg (192 lb 14.4 oz)  BMI 24.76 kg/m2 General: Alert, oriented x3, no distress  Head: no evidence of trauma, PERRL, EOMI, no exophtalmos or lid lag, no myxedema, no xanthelasma; normal ears, nose and oropharynx  Neck: normal jugular venous pulsations and no hepatojugular reflux; brisk carotid pulses without delay and no carotid bruits  Chest: clear to auscultation, no signs of consolidation by percussion or palpation, normal fremitus, symmetrical and full respiratory excursions  Cardiovascular: normal position and quality of the apical impulse, regular rhythm, normal first and second heart sounds, no murmurs, rubs or gallops  Abdomen: no tenderness or distention, no masses by palpation, no abnormal pulsatility or arterial bruits, normal bowel sounds, no hepatosplenomegaly  Extremities: no clubbing, cyanosis or edema; 2+ radial, ulnar and brachial pulses bilaterally; 2+ right femoral, posterior tibial and  dorsalis pedis pulses; 2+ left femoral, posterior tibial and dorsalis pedis pulses; no subclavian or femoral bruits  Neurological: grossly nonfocal   EKG: normal sinus rhythm, LAFB   Lipid Panel     Component Value Date/Time   CHOL 145 01/09/2013 0846   TRIG 90 01/09/2013 0846   HDL 35* 01/09/2013 0846   CHOLHDL 4.1 01/09/2013 0846   VLDL 18 01/09/2013 0846   LDLCALC 92 01/09/2013 0846    BMET    Component Value Date/Time   NA 140 08/25/2014 0954   K 3.8 08/25/2014 0954   CL 104 08/25/2014 0954   CO2 25 08/25/2014 0954   GLUCOSE 93 08/25/2014 0954   BUN 27* 08/25/2014 0954   CREATININE 2.50* 08/25/2014 0954   CREATININE 2.76* 07/08/2014 1058   CALCIUM 9.4 08/25/2014 0954   GFRNONAA 24* 08/25/2014 0954   GFRNONAA 22* 07/08/2014 1058  GFRAA 28* 08/25/2014 0954   GFRAA 25* 07/08/2014 1058     ASSESSMENT AND PLAN  Mr. Solin has well compensated mild left ventricular dysfunction, but his blood pressure is unacceptably high. ACE inhibitor as would be helpful for his cardiomyopathy, but are relatively contraindicated since he has advanced chronic kidney disease. Will add amlodipine 5 mg daily. Target systolic blood pressure around [id number] or less, diastolic blood pressure 80 or less.  Note the risk of worsening metabolic parameters (hyperlipidemia and worsening glycemic control) on protease inhibitor therapy.  Meds ordered this encounter  Medications  . amLODipine (NORVASC) 5 MG tablet    Sig: Take 1 tablet (5 mg total) by mouth daily.    Dispense:  30 tablet    Refill:  [address], [place], [name] [phone] office [phone] pager

## 2014-09-11 ENCOUNTER — Ambulatory Visit: Payer: Medicare HMO | Admitting: Cardiovascular Disease

## 2014-09-22 ENCOUNTER — Encounter (HOSPITAL_COMMUNITY)
Admission: RE | Admit: 2014-09-22 | Discharge: 2014-09-22 | Disposition: A | Discharge status: Home / Self Care | Payer: Medicare HMO | Source: Ambulatory Visit | Attending: Nephrology | Admitting: Nephrology

## 2014-09-22 VITALS — BP 163/67 | HR 60 | Temp 97.8°F | Resp 20

## 2014-09-22 DIAGNOSIS — N184 Chronic kidney disease, stage 4 (severe): Secondary | ICD-10-CM | POA: Diagnosis not present

## 2014-09-22 LAB — RENAL FUNCTION PANEL
Albumin: 3.5 g/dL (ref 3.5–5.2)
Anion gap: 12 (ref 5–15)
BUN: 25 mg/dL — ABNORMAL HIGH (ref 6–23)
CALCIUM: 9.2 mg/dL (ref 8.4–10.5)
CO2: 22 mEq/L (ref 19–32)
CREATININE: 2.35 mg/dL — AB (ref 0.50–1.35)
Chloride: 107 mEq/L (ref 96–112)
GFR calc Af Amer: 30 mL/min — ABNORMAL LOW (ref >90)
GFR, EST NON AFRICAN AMERICAN: 26 mL/min — AB (ref >90)
Glucose, Bld: 126 mg/dL — ABNORMAL HIGH (ref 70–99)
Phosphorus: 4.1 mg/dL (ref 2.3–4.6)
Potassium: 4 mEq/L (ref 3.7–5.3)
Sodium: 141 mEq/L (ref 137–147)

## 2014-09-22 LAB — POCT HEMOGLOBIN-HEMACUE: HEMOGLOBIN: 13.2 g/dL (ref 13.0–17.0)

## 2014-09-22 LAB — IRON AND TIBC
Iron: 58 ug/dL (ref 42–135)
Saturation Ratios: 30 % (ref 20–55)
TIBC: 196 ug/dL — ABNORMAL LOW (ref 215–435)
UIBC: 138 ug/dL (ref 125–400)

## 2014-09-22 LAB — FERRITIN: FERRITIN: 588 ng/mL — AB (ref 22–322)

## 2014-09-22 MED ORDER — EPOETIN ALFA 40000 UNIT/ML IJ SOLN: 30000.0000 [IU] | INTRAMUSCULAR | Status: DC

## 2014-10-06 ENCOUNTER — Encounter (HOSPITAL_COMMUNITY)
Admission: RE | Admit: 2014-10-06 | Discharge: 2014-10-06 | Disposition: A | Discharge status: Home / Self Care | Payer: Commercial Managed Care - HMO | Source: Ambulatory Visit | Attending: Nephrology | Admitting: Nephrology

## 2014-10-06 DIAGNOSIS — D638 Anemia in other chronic diseases classified elsewhere: Secondary | ICD-10-CM | POA: Diagnosis present

## 2014-10-06 DIAGNOSIS — N183 Chronic kidney disease, stage 3 (moderate): Secondary | ICD-10-CM | POA: Insufficient documentation

## 2014-10-06 DIAGNOSIS — N184 Chronic kidney disease, stage 4 (severe): Secondary | ICD-10-CM

## 2014-10-06 LAB — POCT HEMOGLOBIN-HEMACUE: Hemoglobin: 12.8 g/dL — ABNORMAL LOW (ref 13.0–17.0)

## 2014-10-06 MED ORDER — EPOETIN ALFA 40000 UNIT/ML IJ SOLN: 30000.0000 [IU] | INTRAMUSCULAR | Status: DC

## 2014-10-20 ENCOUNTER — Encounter (HOSPITAL_COMMUNITY)
Admission: RE | Admit: 2014-10-20 | Discharge: 2014-10-20 | Disposition: A | Discharge status: Home / Self Care | Payer: Commercial Managed Care - HMO | Source: Ambulatory Visit | Attending: Nephrology | Admitting: Nephrology

## 2014-10-20 DIAGNOSIS — N184 Chronic kidney disease, stage 4 (severe): Secondary | ICD-10-CM

## 2014-10-20 DIAGNOSIS — D638 Anemia in other chronic diseases classified elsewhere: Secondary | ICD-10-CM | POA: Diagnosis not present

## 2014-10-20 LAB — RENAL FUNCTION PANEL
Albumin: 3.3 g/dL — ABNORMAL LOW (ref 3.5–5.2)
Anion gap: 15 (ref 5–15)
BUN: 27 mg/dL — ABNORMAL HIGH (ref 6–23)
CO2: 21 meq/L (ref 19–32)
Calcium: 8.9 mg/dL (ref 8.4–10.5)
Chloride: 103 meq/L (ref 96–112)
Creatinine, Ser: 2.25 mg/dL — ABNORMAL HIGH (ref 0.50–1.35)
GFR calc Af Amer: 32 mL/min — ABNORMAL LOW (ref >90)
GFR calc non Af Amer: 27 mL/min — ABNORMAL LOW (ref >90)
Glucose, Bld: 125 mg/dL — ABNORMAL HIGH (ref 70–99)
Phosphorus: 3.5 mg/dL (ref 2.3–4.6)
Potassium: 3.8 meq/L (ref 3.7–5.3)
Sodium: 139 meq/L (ref 137–147)

## 2014-10-20 LAB — POCT HEMOGLOBIN-HEMACUE: Hemoglobin: 10.5 g/dL — ABNORMAL LOW (ref 13.0–17.0)

## 2014-10-20 LAB — FERRITIN: Ferritin: 647 ng/mL — ABNORMAL HIGH (ref 22–322)

## 2014-10-20 LAB — IRON AND TIBC
IRON: 88 ug/dL (ref 42–135)
Saturation Ratios: 48 % (ref 20–55)
TIBC: 185 ug/dL — AB (ref 215–435)
UIBC: 97 ug/dL — ABNORMAL LOW (ref 125–400)

## 2014-10-20 MED ORDER — EPOETIN ALFA 40000 UNIT/ML IJ SOLN: 30000.0000 [IU] | INTRAMUSCULAR | Status: DC

## 2014-10-20 MED ORDER — EPOETIN ALFA 10000 UNIT/ML IJ SOLN
INTRAMUSCULAR | Status: AC
  Administered 2014-10-20: 10000 [IU] via SUBCUTANEOUS
  Filled 2014-10-20: qty 1

## 2014-10-20 MED ORDER — EPOETIN ALFA 20000 UNIT/ML IJ SOLN
INTRAMUSCULAR | Status: AC
  Administered 2014-10-20: 20000 [IU] via SUBCUTANEOUS
  Filled 2014-10-20: qty 1

## 2014-11-02 ENCOUNTER — Encounter: Payer: Self-pay | Admitting: Infectious Disease

## 2014-11-02 ENCOUNTER — Ambulatory Visit (INDEPENDENT_AMBULATORY_CARE_PROVIDER_SITE_OTHER): Payer: Commercial Managed Care - HMO | Admitting: Infectious Disease

## 2014-11-02 VITALS — BP 174/78 | HR 57 | Temp 97.7°F | Wt 188.0 lb

## 2014-11-02 DIAGNOSIS — D61818 Other pancytopenia: Secondary | ICD-10-CM

## 2014-11-02 DIAGNOSIS — N183 Chronic kidney disease, stage 3 unspecified: Secondary | ICD-10-CM

## 2014-11-02 DIAGNOSIS — B20 Human immunodeficiency virus [HIV] disease: Secondary | ICD-10-CM

## 2014-11-02 DIAGNOSIS — H547 Unspecified visual loss: Secondary | ICD-10-CM

## 2014-11-02 MED ORDER — ABACAVIR-DOLUTEGRAVIR-LAMIVUD 600-50-300 MG PO TABS: 1.0000 | ORAL_TABLET | Freq: Every day | ORAL | Status: DC

## 2014-11-02 NOTE — Progress Notes (Signed)
Subjective:    Patient ID: David [name], male    DOB: 1941/06/23, 73 y.o.   MRN: [id number]  HPI   73 year old newly diagnosed HIV + man with AIDS, prior syphilis and now acute on chronic kidney disease, pancytopenia, recently dc fro [facility] where I saw him as an inpatient. He was supposed to be dC on oral retrovir 300 bid, Renally dosed 185m Epivir daily and Isentress 4084mbid but there was mixup at dc and pharmacy did nto have liquid epivir so instead he was given Combivir 30069mid, and Isentress 400m60md which he has been faithfully taking since then (he came to clinic without meds but then went home and brought back). He has several eye drops he is taking from Dr. GrosJohney Mainehtho) and [facility] was not known to them at time they were seeing him.   Last saw him his anemia has worsened to the point where he is now requiring effusions of epogen.  His renal function has not improved substantially on labs checked yesterday.  His HLA [id number] is negative. His SPAP had  been approved to help cover his antiretrovirals.  I changed himto Isentress twice daily, Ziagen 2 300 mg tablets or 600 mg daily along with Epivir at 150 mg tablet daily.  His virus is coming down nicely under control and CD4 count is healthy.  Lab Results  Component Value Date   HIV1RNAQUANT 99* 07/08/2014   Lab Results  Component Value Date   CD4TABS 170* 07/08/2014   CD4TABS 80* 06/10/2014   CD4TABS 60* 05/13/2014    I am proposing to [facility] to [facility].    Review of Systems  Constitutional: Negative for fever, chills, diaphoresis, activity change, appetite change, fatigue and unexpected weight change.  HENT: Negative for congestion, rhinorrhea, sinus pressure, sneezing, sore throat and trouble swallowing.   Respiratory: Negative for cough, chest tightness and shortness of breath.   Cardiovascular: Negative for chest pain, palpitations and leg swelling.    Gastrointestinal: Negative for nausea, vomiting, abdominal pain, constipation and abdominal distention.  Genitourinary: Negative for hematuria.  Musculoskeletal: Negative for myalgias.  Skin: Negative for color change, pallor, rash and wound.  Neurological: Negative for dizziness, tremors, weakness and light-headedness.  Hematological: Negative for adenopathy. Does not bruise/bleed easily.  Psychiatric/Behavioral: Positive for dysphoric mood. Negative for behavioral problems, confusion, sleep disturbance, decreased concentration and agitation.       Objective:   Physical Exam  Constitutional: He is oriented to person, place, and time. He appears well-developed and well-nourished. No distress.  HENT:  Head: Normocephalic and atraumatic.  Mouth/Throat: Oropharynx is clear and moist. No oropharyngeal exudate.  Eyes: Conjunctivae and EOM are normal. Pupils are equal, round, and reactive to light. No scleral icterus.  Neck: Normal range of motion. Neck supple. No JVD present.  Cardiovascular: Normal rate, regular rhythm and normal heart sounds.  Exam reveals no gallop and no friction rub.   No murmur heard. Pulmonary/Chest: Effort normal and breath sounds normal. No respiratory distress. He has no wheezes. He has no rales. He exhibits no tenderness.  Abdominal: He exhibits no distension and no mass. There is no tenderness. There is no rebound and no guarding.  Musculoskeletal: He exhibits no edema or tenderness.  Lymphadenopathy:    He has no cervical adenopathy.  Neurological: He is alert and oriented to person, place, and time. He has normal reflexes. He exhibits normal muscle tone.  Coordination normal.  Skin: Skin is warm and dry. He is not diaphoretic. No erythema. No pallor.  Psychiatric: He has a normal mood and affect. His behavior is normal. Judgment and thought content normal.          Assessment & Plan:   HIV: will check labs today HIV VL and CD4. Will consolidate to  [facility]. I spent greater than 25 minutes with the patient including greater than 50% of time in face to face counsel of the patient and in coordination of their care.   Continue bactrim for now   I spent greater than 25 minutes with the patient including greater than 50% of time in face to face counsel of the patient and in coordination of their care.  Syphilis: Titer was 1:168 in 2000 and sp successful treatment, follow titers in upcoming months  Eye lesion: ? Has he been seen again by Dr. 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

## 2014-11-02 NOTE — Patient Instructions (Signed)
We are going to simplify your regimen:  I want you to start taking TRIUMEQ one pill once a day once your current meds run out  When you switch to [place], please STOP Isentress, Ziagen and Epivir  Continue the Bactrim DS tablet three times weekly  You need blood work done today and in 6 weeks with followup appt with me in 2 months time

## 2014-11-03 ENCOUNTER — Encounter (HOSPITAL_COMMUNITY)
Admission: RE | Admit: 2014-11-03 | Discharge: 2014-11-03 | Disposition: A | Discharge status: Home / Self Care | Payer: Medicare HMO | Source: Ambulatory Visit | Attending: Nephrology | Admitting: Nephrology

## 2014-11-03 DIAGNOSIS — N184 Chronic kidney disease, stage 4 (severe): Secondary | ICD-10-CM | POA: Diagnosis not present

## 2014-11-03 LAB — HIV-1 RNA QUANT-NO REFLEX-BLD
HIV 1 RNA Quant: 70 copies/mL — ABNORMAL HIGH (ref <20)
HIV-1 RNA QUANT, LOG: 1.85 {Log} — AB (ref <1.30)

## 2014-11-03 LAB — T-HELPER CELL (CD4) - (RCID CLINIC ONLY)
CD4 T CELL ABS: 190 /uL — AB (ref 400–2700)
CD4 T CELL HELPER: 13 % — AB (ref 33–55)

## 2014-11-03 LAB — POCT HEMOGLOBIN-HEMACUE: Hemoglobin: 11.5 g/dL — ABNORMAL LOW (ref 13.0–17.0)

## 2014-11-03 MED ORDER — EPOETIN ALFA 40000 UNIT/ML IJ SOLN: 30000.0000 [IU] | INTRAMUSCULAR | Status: DC

## 2014-11-03 MED ORDER — EPOETIN ALFA 20000 UNIT/ML IJ SOLN
INTRAMUSCULAR | Status: AC
  Administered 2014-11-03: 20000 [IU] via SUBCUTANEOUS
  Filled 2014-11-03: qty 1

## 2014-11-03 MED ORDER — EPOETIN ALFA 10000 UNIT/ML IJ SOLN
INTRAMUSCULAR | Status: AC
  Administered 2014-11-03: 10000 [IU] via SUBCUTANEOUS
  Filled 2014-11-03: qty 1

## 2014-11-17 ENCOUNTER — Encounter (HOSPITAL_COMMUNITY)
Admission: RE | Admit: 2014-11-17 | Discharge: 2014-11-17 | Disposition: A | Discharge status: Home / Self Care | Payer: Medicare HMO | Source: Ambulatory Visit | Attending: Nephrology | Admitting: Nephrology

## 2014-11-17 DIAGNOSIS — N184 Chronic kidney disease, stage 4 (severe): Secondary | ICD-10-CM

## 2014-11-17 LAB — POCT HEMOGLOBIN-HEMACUE: HEMOGLOBIN: 12.1 g/dL — AB (ref 13.0–17.0)

## 2014-11-17 LAB — IRON AND TIBC
Iron: 89 ug/dL (ref 42–135)
SATURATION RATIOS: 43 % (ref 20–55)
TIBC: 209 ug/dL — ABNORMAL LOW (ref 215–435)
UIBC: 120 ug/dL — ABNORMAL LOW (ref 125–400)

## 2014-11-17 LAB — RENAL FUNCTION PANEL
Albumin: 3.6 g/dL (ref 3.5–5.2)
Anion gap: 11 (ref 5–15)
BUN: 40 mg/dL — ABNORMAL HIGH (ref 6–23)
CO2: 23 meq/L (ref 19–32)
Calcium: 9.3 mg/dL (ref 8.4–10.5)
Chloride: 106 meq/L (ref 96–112)
Creatinine, Ser: 3.03 mg/dL — ABNORMAL HIGH (ref 0.50–1.35)
GFR calc Af Amer: 22 mL/min — ABNORMAL LOW
GFR calc non Af Amer: 19 mL/min — ABNORMAL LOW
Glucose, Bld: 123 mg/dL — ABNORMAL HIGH (ref 70–99)
Phosphorus: 4.2 mg/dL (ref 2.3–4.6)
Potassium: 4.3 meq/L (ref 3.7–5.3)
Sodium: 140 meq/L (ref 137–147)

## 2014-11-17 LAB — FERRITIN: Ferritin: 675 ng/mL — ABNORMAL HIGH (ref 22–322)

## 2014-11-17 MED ORDER — EPOETIN ALFA 40000 UNIT/ML IJ SOLN: 30000.0000 [IU] | INTRAMUSCULAR | Status: DC

## 2014-11-30 ENCOUNTER — Other Ambulatory Visit (HOSPITAL_COMMUNITY): Payer: Self-pay | Admitting: *Deleted

## 2014-12-01 ENCOUNTER — Encounter (HOSPITAL_COMMUNITY)
Admission: RE | Admit: 2014-12-01 | Discharge: 2014-12-01 | Disposition: A | Discharge status: Home / Self Care | Payer: Medicare HMO | Source: Ambulatory Visit | Attending: Nephrology | Admitting: Nephrology

## 2014-12-01 DIAGNOSIS — N184 Chronic kidney disease, stage 4 (severe): Secondary | ICD-10-CM | POA: Diagnosis not present

## 2014-12-01 LAB — POCT HEMOGLOBIN-HEMACUE: Hemoglobin: 10 g/dL — ABNORMAL LOW (ref 13.0–17.0)

## 2014-12-01 MED ORDER — EPOETIN ALFA 40000 UNIT/ML IJ SOLN: 30000.0000 [IU] | INTRAMUSCULAR | Status: DC

## 2014-12-01 MED ORDER — EPOETIN ALFA 20000 UNIT/ML IJ SOLN
INTRAMUSCULAR | Status: AC
  Administered 2014-12-01: 20000 [IU] via SUBCUTANEOUS
  Filled 2014-12-01: qty 1

## 2014-12-01 MED ORDER — EPOETIN ALFA 10000 UNIT/ML IJ SOLN
INTRAMUSCULAR | Status: AC
  Administered 2014-12-01: 10000 [IU] via SUBCUTANEOUS
  Filled 2014-12-01: qty 1

## 2014-12-15 ENCOUNTER — Encounter (HOSPITAL_COMMUNITY)
Admission: RE | Admit: 2014-12-15 | Discharge: 2014-12-15 | Disposition: A | Discharge status: Home / Self Care | Payer: Commercial Managed Care - HMO | Source: Ambulatory Visit | Attending: Nephrology | Admitting: Nephrology

## 2014-12-15 DIAGNOSIS — N184 Chronic kidney disease, stage 4 (severe): Secondary | ICD-10-CM | POA: Insufficient documentation

## 2014-12-15 LAB — POCT HEMOGLOBIN-HEMACUE: Hemoglobin: 12.1 g/dL — ABNORMAL LOW (ref 13.0–17.0)

## 2014-12-15 LAB — IRON AND TIBC
IRON: 67 ug/dL (ref 42–165)
Saturation Ratios: 30 % (ref 20–55)
TIBC: 225 ug/dL (ref 215–435)
UIBC: 158 ug/dL (ref 125–400)

## 2014-12-15 LAB — RENAL FUNCTION PANEL
ALBUMIN: 3.7 g/dL (ref 3.5–5.2)
ANION GAP: 7 (ref 5–15)
BUN: 31 mg/dL — ABNORMAL HIGH (ref 6–23)
CO2: 24 mmol/L (ref 19–32)
Calcium: 9.2 mg/dL (ref 8.4–10.5)
Chloride: 110 mEq/L (ref 96–112)
Creatinine, Ser: 3.16 mg/dL — ABNORMAL HIGH (ref 0.50–1.35)
GFR calc Af Amer: 21 mL/min — ABNORMAL LOW (ref >90)
GFR, EST NON AFRICAN AMERICAN: 18 mL/min — AB (ref >90)
Glucose, Bld: 121 mg/dL — ABNORMAL HIGH (ref 70–99)
POTASSIUM: 4.3 mmol/L (ref 3.5–5.1)
Phosphorus: 4.3 mg/dL (ref 2.3–4.6)
SODIUM: 141 mmol/L (ref 135–145)

## 2014-12-15 LAB — FERRITIN: Ferritin: 543 ng/mL — ABNORMAL HIGH (ref 22–322)

## 2014-12-15 MED ORDER — EPOETIN ALFA 40000 UNIT/ML IJ SOLN: 30000.0000 [IU] | INTRAMUSCULAR | Status: DC

## 2014-12-16 ENCOUNTER — Other Ambulatory Visit: Payer: Commercial Managed Care - HMO

## 2014-12-16 ENCOUNTER — Other Ambulatory Visit: Payer: Self-pay | Admitting: *Deleted

## 2014-12-16 ENCOUNTER — Ambulatory Visit (INDEPENDENT_AMBULATORY_CARE_PROVIDER_SITE_OTHER): Payer: Commercial Managed Care - HMO | Admitting: Cardiovascular Disease

## 2014-12-16 ENCOUNTER — Encounter: Payer: Self-pay | Admitting: Cardiovascular Disease

## 2014-12-16 VITALS — BP 162/94 | HR 56 | Ht 74.0 in | Wt 198.9 lb

## 2014-12-16 DIAGNOSIS — I5030 Unspecified diastolic (congestive) heart failure: Secondary | ICD-10-CM

## 2014-12-16 DIAGNOSIS — I429 Cardiomyopathy, unspecified: Secondary | ICD-10-CM

## 2014-12-16 DIAGNOSIS — I428 Other cardiomyopathies: Secondary | ICD-10-CM

## 2014-12-16 DIAGNOSIS — N184 Chronic kidney disease, stage 4 (severe): Secondary | ICD-10-CM

## 2014-12-16 DIAGNOSIS — B20 Human immunodeficiency virus [HIV] disease: Secondary | ICD-10-CM

## 2014-12-16 DIAGNOSIS — I10 Essential (primary) hypertension: Secondary | ICD-10-CM

## 2014-12-16 DIAGNOSIS — Z79899 Other long term (current) drug therapy: Secondary | ICD-10-CM

## 2014-12-16 DIAGNOSIS — I420 Dilated cardiomyopathy: Secondary | ICD-10-CM

## 2014-12-16 DIAGNOSIS — E785 Hyperlipidemia, unspecified: Secondary | ICD-10-CM | POA: Diagnosis not present

## 2014-12-16 LAB — COMPLETE METABOLIC PANEL WITH GFR
ALBUMIN: 3.7 g/dL (ref 3.5–5.2)
ALK PHOS: 88 U/L (ref 39–117)
ALT: 14 U/L (ref 0–53)
AST: 19 U/L (ref 0–37)
BUN: 29 mg/dL — AB (ref 6–23)
CALCIUM: 9.2 mg/dL (ref 8.4–10.5)
CHLORIDE: 109 meq/L (ref 96–112)
CO2: 21 mEq/L (ref 19–32)
Creat: 2.87 mg/dL — ABNORMAL HIGH (ref 0.50–1.35)
GFR, Est African American: 24 mL/min — ABNORMAL LOW
GFR, Est Non African American: 21 mL/min — ABNORMAL LOW
Glucose, Bld: 91 mg/dL (ref 70–99)
POTASSIUM: 4.5 meq/L (ref 3.5–5.3)
SODIUM: 142 meq/L (ref 135–145)
Total Bilirubin: 0.5 mg/dL (ref 0.2–1.2)
Total Protein: 7.3 g/dL (ref 6.0–8.3)

## 2014-12-16 LAB — CBC WITH DIFFERENTIAL/PLATELET
Basophils Absolute: 0 10*3/uL (ref 0.0–0.1)
Basophils Relative: 1 % (ref 0–1)
EOS ABS: 0.3 10*3/uL (ref 0.0–0.7)
Eosinophils Relative: 7 % — ABNORMAL HIGH (ref 0–5)
HEMATOCRIT: 39.4 % (ref 39.0–52.0)
HEMOGLOBIN: 13.1 g/dL (ref 13.0–17.0)
Lymphocytes Relative: 37 % (ref 12–46)
Lymphs Abs: 1.4 10*3/uL (ref 0.7–4.0)
MCH: 33.9 pg (ref 26.0–34.0)
MCHC: 33.2 g/dL (ref 30.0–36.0)
MCV: 102.1 fL — AB (ref 78.0–100.0)
MONO ABS: 0.3 10*3/uL (ref 0.1–1.0)
MPV: 10.1 fL (ref 8.6–12.4)
Monocytes Relative: 7 % (ref 3–12)
Neutro Abs: 1.8 10*3/uL (ref 1.7–7.7)
Neutrophils Relative %: 48 % (ref 43–77)
Platelets: 209 10*3/uL (ref 150–400)
RBC: 3.86 MIL/uL — ABNORMAL LOW (ref 4.22–5.81)
RDW: 16.2 % — AB (ref 11.5–15.5)
WBC: 3.8 10*3/uL — ABNORMAL LOW (ref 4.0–10.5)

## 2014-12-16 MED ORDER — ABACAVIR-DOLUTEGRAVIR-LAMIVUD 600-50-300 MG PO TABS: 1.0000 | ORAL_TABLET | Freq: Every day | ORAL | Status: DC

## 2014-12-16 MED ORDER — AMLODIPINE BESYLATE 5 MG PO TABS: 7.5000 mg | ORAL_TABLET | Freq: Every day | ORAL | Status: DC

## 2014-12-16 NOTE — Patient Instructions (Signed)
INCREASE Amlodipine to 7.5mg  (1 1/2) tablet daily.  Your physician recommends that you return for lab work in: FASTING at [facility] before next appointment in 6 months.  [name] recommends that you schedule a follow-up appointment in: 6 months.

## 2014-12-16 NOTE — Telephone Encounter (Signed)
SPAP Application

## 2014-12-17 LAB — T-HELPER CELL (CD4) - (RCID CLINIC ONLY)
CD4 % Helper T Cell: 13 % — ABNORMAL LOW (ref 33–55)
CD4 T Cell Abs: 170 /uL — ABNORMAL LOW (ref 400–2700)

## 2014-12-17 NOTE — Progress Notes (Signed)
Patient ID: PACKER PAMPLONA, male   DOB: 1941/04/18, 74 y.o.   MRN: [id number]     Reason for office visit Hypertension, nonischemic dilated cardiomyopathy and, chronic kidney disease  Mr. Pasztor continues to be well compensated from a hemodynamic standpoint. He has a long-standing history of nonischemic cardiomyopathy (normal coronary angio 2004) with an ejection fraction that was as low as 30% in the past, but on therapy increased to 45-50%. He has no symptoms of congestive heart failure at this time. During evaluation for unexplained weight loss who was found to have a new diagnosis of HIV/AIDS with a CD4 count of only 60. He has started antiretroviral therapy (Dr. Tommy Medal). He has gained back about 17 of the 40 lb he had lost, after initiation of antiretroviral therapy.  His blood pressure has been consistently elevated despite the fact that he reports compliance with high-dose carvedilol and amlodipine. Not on diuretics or RAAS inhibitors due advanced CKD, now requiring Epo (Dr. Posey Pronto).  Allergies  Allergen Reactions  . Cozaar [Losartan Potassium] Other (See Comments)    constipation    Current Outpatient Prescriptions  Medication Sig Dispense Refill  . acetaminophen (TYLENOL) 500 MG tablet Take 500 mg by mouth 2 (two) times daily.    Marland Kitchen amLODipine (NORVASC) 5 MG tablet Take 1.5 tablets (7.5 mg total) by mouth daily. 45 tablet 6  . aspirin 325 MG EC tablet Take 325 mg by mouth daily.      . carvedilol (COREG) 25 MG tablet Take 1 tablet (25 mg total) by mouth 2 (two) times daily. 180 tablet 3  . dorzolamide-timolol (COSOPT) 22.3-6.8 MG/ML ophthalmic solution Place 1 drop into the left eye 2 (two) times daily.     Marland Kitchen erythromycin ophthalmic ointment Place 1 application into the right eye at bedtime.    Marland Kitchen latanoprost (XALATAN) 0.005 % ophthalmic solution Place 1 drop into the left eye at bedtime.     . NEOMYCIN-POLYMYXIN-HC, OPHTH, SUSP Place 2 drops into both eyes 2 (two) times daily.    Marland Kitchen  ofloxacin (OCUFLOX) 0.3 % ophthalmic solution Place 1 drop into the left eye daily.   0  . sulfamethoxazole-trimethoprim (BACTRIM DS,SEPTRA DS) 800-160 MG per tablet Take 1 tablet by mouth 3 (three) times a week.   5  . terazosin (HYTRIN) 10 MG capsule Take 10 mg by mouth at bedtime.     . Abacavir-Dolutegravir-Lamivud (TRIUMEQ) 600-50-300 MG TABS Take 1 tablet by mouth daily. 30 tablet 11   No current facility-administered medications for this visit.    Past Medical History  Diagnosis Date  . Hypertension     followed by [facility] (Dr Dani Gobble Dalesha Stanback)  . Hyperlipidemia   . Cancer     hx of prostate; s/p radioactive seed implant 10/2009 Dr Janice Norrie  . 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 History of cardiac cath 2004    negative for CAD  . History of myocardial perfusion scan 02/2010    negative for coronary insufficiency (LVEF 27%)  . Anemia, iron deficiency 11/15/2011  . Nonischemic cardiomyopathy   . Sinus bradycardia   . HIV infection     Past Surgical History  Procedure Laterality Date  . Radioactive seed implant  2010    prostate cancer  . Cardiac catheterization  02/04/2003    mildly depressed LV systolic fx EF [id number] coronaries/abdominal aorta/renal arteries.  . US echocardiography  02/06/2012    mild LVH,LA  mod. dilated,mild-mod. 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 AI.  Marland Kitchen Nm myocar perf wall motion  02/21/2010    normal  . Esophagogastroduodenoscopy (egd) with propofol N/A 05/05/2014    Procedure: ESOPHAGOGASTRODUODENOSCOPY (EGD) WITH PROPOFOL;  Surgeon: Missy Sabins, MD;  Location: [facility];  Service: Endoscopy;  Laterality: N/A;    Family History  Problem Relation Age of Onset  . Hypertension Mother   . Thyroid disease Mother   . Cholelithiasis Daughter   . Cholelithiasis Son   . Hypertension Maternal Grandmother   . Diabetes Maternal Grandmother   .  Heart attack Neg Hx   . Hyperlipidemia Neg Hx     History   Social History  . Marital Status: Married    Spouse Name: N/A    Number of Children: 2  . Years of Education: N/A   Occupational History  . retired    Social History Main Topics  . Smoking status: Former Smoker    Quit date: 12/04/2005  . Smokeless tobacco: Never Used  . Alcohol Use: Yes     Comment: once a week-wine  . Drug Use: No  . Sexual Activity: Yes   Other Topics Concern  . Not on file   Social History Narrative   Stays active at home   Regular exercise: no    Review of systems: The patient specifically denies any chest pain at rest or with exertion, dyspnea at rest or with exertion, orthopnea, paroxysmal nocturnal dyspnea, syncope, palpitations, focal neurological deficits, intermittent claudication, lower extremity edema, unexplained weight gain, cough, hemoptysis or wheezing.  The patient also denies abdominal pain, nausea, vomiting, dysphagia, diarrhea, constipation, polyuria, polydipsia, dysuria, hematuria, frequency, urgency, abnormal bleeding or bruising, fever, chills, unexpected weight changes, mood swings, change in skin or hair texture, change in voice quality, auditory or visual problems, allergic reactions or rashes, new musculoskeletal complaints other than usual "aches and pains".   PHYSICAL EXAM BP 162/94 mmHg  Pulse 56  Ht 6\' 2"  (1.88 m)  Wt 198 lb 14.4 oz (90.22 kg)  BMI 25.53 kg/m2 General: Alert, oriented x3, no distress  Head: no evidence of trauma, PERRL, EOMI, no exophtalmos or lid lag, no myxedema, no xanthelasma; normal ears, nose and oropharynx  Neck: normal jugular venous pulsations and no hepatojugular reflux; brisk carotid pulses without delay and no carotid bruits  Chest: clear to auscultation, no signs of consolidation by percussion or palpation, normal fremitus, symmetrical and full respiratory excursions  Cardiovascular: normal position and quality of the apical  impulse, regular rhythm, normal first and second heart sounds, no murmurs, rubs or gallops  Abdomen: no tenderness or distention, no masses by palpation, no abnormal pulsatility or arterial bruits, normal bowel sounds, no hepatosplenomegaly  Extremities: no clubbing, cyanosis or edema; 2+ radial, ulnar and brachial pulses bilaterally; 2+ right femoral, posterior tibial and dorsalis pedis pulses; 2+ left femoral, posterior tibial and dorsalis pedis pulses; no subclavian or femoral bruits  Neurological: grossly nonfocal   EKG: normal sinus rhythm, LAFB  Lipid Panel     Component Value Date/Time   CHOL 145 01/09/2013 0846   TRIG 90 01/09/2013 0846   HDL 35* 01/09/2013 0846   CHOLHDL 4.1 01/09/2013 0846   VLDL 18 01/09/2013 0846   LDLCALC 92 01/09/2013 0846    BMET    Component Value Date/Time   NA 142 12/16/2014 1108   K 4.5 12/16/2014 1108   CL 109 12/16/2014 1108   CO2 21 12/16/2014 1108  GLUCOSE 91 12/16/2014 1108   BUN 29* 12/16/2014 1108   CREATININE 2.87* 12/16/2014 1108   CREATININE 3.16* 12/15/2014 0838   CALCIUM 9.2 12/16/2014 1108   GFRNONAA 21* 12/16/2014 1108   GFRNONAA 18* 12/15/2014 0838   GFRAA 24* 12/16/2014 1108   GFRAA 21* 12/15/2014 0838     ASSESSMENT AND PLAN Mr. Orloff has well compensated mild left ventricular dysfunction, but his blood pressure is unacceptably high. Will increase amlodipine to 7.5 mg daily. Target systolic blood pressure around [id number] or less, diastolic blood pressure 80 or less.  Note the risk of worsening metabolic parameters (hyperlipidemia and worsening glycemic control) on protease inhibitor therapy. Need to recheck Orders Placed This Encounter  Procedures  . Lipid panel  . Comprehensive metabolic panel  . EKG 12-Lead   Meds ordered this encounter  Medications  . amLODipine (NORVASC) 5 MG tablet    Sig: Take 1.5 tablets (7.5 mg total) by mouth daily.    Dispense:  45 tablet    Refill:  [place]  Santo Zahradnik, MD, [facility] [phone] office [phone] pager

## 2014-12-18 LAB — HIV-1 RNA QUANT-NO REFLEX-BLD
HIV 1 RNA Quant: 28 copies/mL — ABNORMAL HIGH (ref <20)
HIV-1 RNA Quant, Log: 1.45 {Log} — ABNORMAL HIGH (ref <1.30)

## 2014-12-29 ENCOUNTER — Encounter (HOSPITAL_COMMUNITY)
Admission: RE | Admit: 2014-12-29 | Discharge: 2014-12-29 | Disposition: A | Discharge status: Home / Self Care | Payer: Commercial Managed Care - HMO | Source: Ambulatory Visit | Attending: Nephrology | Admitting: Nephrology

## 2014-12-29 DIAGNOSIS — N184 Chronic kidney disease, stage 4 (severe): Secondary | ICD-10-CM | POA: Diagnosis not present

## 2014-12-29 LAB — POCT HEMOGLOBIN-HEMACUE: Hemoglobin: 12.7 g/dL — ABNORMAL LOW (ref 13.0–17.0)

## 2014-12-29 MED ORDER — EPOETIN ALFA 10000 UNIT/ML IJ SOLN
INTRAMUSCULAR | Status: AC
  Filled 2014-12-29: qty 1

## 2014-12-29 MED ORDER — EPOETIN ALFA 40000 UNIT/ML IJ SOLN: 30000.0000 [IU] | INTRAMUSCULAR | Status: DC

## 2014-12-29 MED ORDER — EPOETIN ALFA 20000 UNIT/ML IJ SOLN
INTRAMUSCULAR | Status: AC
  Filled 2014-12-29: qty 1

## 2014-12-30 ENCOUNTER — Telehealth: Payer: Self-pay | Admitting: Family

## 2014-12-30 ENCOUNTER — Ambulatory Visit (INDEPENDENT_AMBULATORY_CARE_PROVIDER_SITE_OTHER): Payer: Commercial Managed Care - HMO | Admitting: Infectious Disease

## 2014-12-30 ENCOUNTER — Encounter: Payer: Self-pay | Admitting: Infectious Disease

## 2014-12-30 VITALS — BP 167/80 | HR 66 | Temp 97.9°F | Wt 205.0 lb

## 2014-12-30 DIAGNOSIS — N184 Chronic kidney disease, stage 4 (severe): Secondary | ICD-10-CM | POA: Diagnosis not present

## 2014-12-30 DIAGNOSIS — I129 Hypertensive chronic kidney disease with stage 1 through stage 4 chronic kidney disease, or unspecified chronic kidney disease: Secondary | ICD-10-CM

## 2014-12-30 DIAGNOSIS — I429 Cardiomyopathy, unspecified: Secondary | ICD-10-CM

## 2014-12-30 DIAGNOSIS — B20 Human immunodeficiency virus [HIV] disease: Secondary | ICD-10-CM | POA: Diagnosis not present

## 2014-12-30 DIAGNOSIS — N189 Chronic kidney disease, unspecified: Secondary | ICD-10-CM

## 2014-12-30 DIAGNOSIS — N181 Chronic kidney disease, stage 1: Secondary | ICD-10-CM | POA: Diagnosis not present

## 2014-12-30 DIAGNOSIS — I420 Dilated cardiomyopathy: Secondary | ICD-10-CM

## 2014-12-30 DIAGNOSIS — N182 Chronic kidney disease, stage 2 (mild): Secondary | ICD-10-CM | POA: Diagnosis not present

## 2014-12-30 DIAGNOSIS — A529 Late syphilis, unspecified: Secondary | ICD-10-CM

## 2014-12-30 DIAGNOSIS — N185 Chronic kidney disease, stage 5: Secondary | ICD-10-CM

## 2014-12-30 DIAGNOSIS — N183 Chronic kidney disease, stage 3 (moderate): Secondary | ICD-10-CM

## 2014-12-30 NOTE — Progress Notes (Signed)
Subjective:    Patient ID: David [name], male    DOB: 1941-01-25, 74 y.o.   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 Cardiology upped his Norvasc, but he will liklely need another agent. Also to see Nephrology soon and would recommend asking their advice along with PCP re another agent. He is still down due to fact that wife will no longer have sex with him.  Lab Results  Component Value Date   HIV1RNAQUANT 28* 12/16/2014   Lab Results  Component Value Date   CD4TABS 170* 12/16/2014   CD4TABS 190* 11/02/2014   CD4TABS 170* 07/08/2014   .    Review of Systems  Constitutional: Negative for fever, chills, diaphoresis, activity change, appetite change, fatigue and unexpected weight change.  HENT: Negative for congestion, rhinorrhea, sinus pressure, sneezing, sore throat and trouble swallowing.   Respiratory: Negative for cough, chest tightness and shortness of breath.   Cardiovascular: Negative for chest pain, palpitations and leg swelling.  Gastrointestinal: Negative for nausea, vomiting, abdominal pain, constipation and abdominal distention.  Genitourinary: Negative for hematuria.  Musculoskeletal: Negative for myalgias.  Skin: Negative for color change, pallor, rash and wound.  Neurological: Negative for dizziness, tremors, weakness and light-headedness.  Hematological: Negative for adenopathy. Does not bruise/bleed easily.  Psychiatric/Behavioral: Positive for dysphoric mood. Negative for behavioral problems, confusion, sleep disturbance, decreased concentration and agitation.       Objective:   Physical Exam  Constitutional: He is oriented to person, place, and time. He appears well-developed and well-nourished. No  distress.  HENT:  Head: Normocephalic and atraumatic.  Mouth/Throat: Oropharynx is clear and moist. No oropharyngeal exudate.  Eyes: Conjunctivae and EOM are normal. Pupils are equal, round, and reactive to light. No scleral icterus.  Neck: Normal range of motion. Neck supple. No JVD present.  Cardiovascular: Normal rate, regular rhythm and normal heart sounds.  Exam reveals no gallop and no friction rub.   No murmur heard. Pulmonary/Chest: Effort normal and breath sounds normal. No respiratory distress. He has no wheezes. He has no rales. He exhibits no tenderness.  Abdominal: He exhibits no distension and no mass. There is no tenderness. There is no rebound and no guarding.  Musculoskeletal: He exhibits no edema or tenderness.  Lymphadenopathy:    He has no cervical adenopathy.  Neurological: He is alert and oriented to person, place, and time. He has normal reflexes. He exhibits normal muscle tone. Coordination normal.  Skin: Skin is warm and dry. He is not diaphoretic. No erythema. No pallor.  Psychiatric: He has a normal mood and affect. His behavior is normal. Judgment and thought content normal.          Assessment & Plan:   HIV/AIDS: continu TRIUMEQ.  Continue bactrim for now   I spent greater than 25 minutes with the patient including greater than 50% of time in face to face counsel of the patient and in coordination of their care.  Syphilis: Titer was 1:168 in 2000 and sp successful treatment, follow titers in upcoming months  Eye lesion: ? Has he been seen again by Dr. 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

## 2014-12-30 NOTE — Telephone Encounter (Signed)
Please contact pt to schedule follow up visit so we can adjust his blood pressure meds.

## 2014-12-31 ENCOUNTER — Other Ambulatory Visit: Payer: Self-pay | Admitting: *Deleted

## 2014-12-31 MED ORDER — AMLODIPINE BESYLATE 10 MG PO TABS: 10.0000 mg | ORAL_TABLET | Freq: Every day | ORAL | Status: DC

## 2015-01-05 NOTE — Telephone Encounter (Signed)
Patient states that his cardiologist has adjusted his bp medications and is the doctor that prescribes his bp medications.

## 2015-01-05 NOTE — Telephone Encounter (Signed)
Left message for patient to return my call.

## 2015-01-06 NOTE — Telephone Encounter (Signed)
I reviewed his most recent bp after cardiology adjusted his medication and his blood pressure is still too high.  I would recommend that he schedule sooner follow up with his cardiologist for further BP med adjustments.

## 2015-01-06 NOTE — Telephone Encounter (Signed)
Notified pt and he states that he has spoken with cardiologist recently by phone and further adjustments were just made. He states he will see them again in 1 or 2 months and doesn't feel he needs to follow up with them earlier.

## 2015-01-12 ENCOUNTER — Encounter (HOSPITAL_COMMUNITY)
Admission: RE | Admit: 2015-01-12 | Discharge: 2015-01-12 | Disposition: A | Discharge status: Home / Self Care | Payer: Commercial Managed Care - HMO | Source: Ambulatory Visit | Attending: Nephrology | Admitting: Nephrology

## 2015-01-12 DIAGNOSIS — N184 Chronic kidney disease, stage 4 (severe): Secondary | ICD-10-CM | POA: Insufficient documentation

## 2015-01-12 LAB — RENAL FUNCTION PANEL
Albumin: 3.8 g/dL (ref 3.5–5.2)
Anion gap: 8 (ref 5–15)
BUN: 33 mg/dL — ABNORMAL HIGH (ref 6–23)
CO2: 23 mmol/L (ref 19–32)
Calcium: 9.3 mg/dL (ref 8.4–10.5)
Chloride: 109 mmol/L (ref 96–112)
Creatinine, Ser: 3.33 mg/dL — ABNORMAL HIGH (ref 0.50–1.35)
GFR calc non Af Amer: 17 mL/min — ABNORMAL LOW (ref >90)
GFR, EST AFRICAN AMERICAN: 20 mL/min — AB (ref >90)
GLUCOSE: 115 mg/dL — AB (ref 70–99)
PHOSPHORUS: 3.3 mg/dL (ref 2.3–4.6)
POTASSIUM: 4.2 mmol/L (ref 3.5–5.1)
SODIUM: 140 mmol/L (ref 135–145)

## 2015-01-12 LAB — IRON AND TIBC
Iron: 100 ug/dL (ref 42–165)
SATURATION RATIOS: 49 % (ref 20–55)
TIBC: 205 ug/dL — ABNORMAL LOW (ref 215–435)
UIBC: 105 ug/dL — AB (ref 125–400)

## 2015-01-12 LAB — POCT HEMOGLOBIN-HEMACUE: HEMOGLOBIN: 13.8 g/dL (ref 13.0–17.0)

## 2015-01-12 LAB — FERRITIN: Ferritin: 704 ng/mL — ABNORMAL HIGH (ref 22–322)

## 2015-01-12 MED ORDER — EPOETIN ALFA 40000 UNIT/ML IJ SOLN: 30000.0000 [IU] | INTRAMUSCULAR | Status: DC

## 2015-01-22 DIAGNOSIS — N189 Chronic kidney disease, unspecified: Secondary | ICD-10-CM | POA: Diagnosis not present

## 2015-01-22 DIAGNOSIS — N2581 Secondary hyperparathyroidism of renal origin: Secondary | ICD-10-CM | POA: Diagnosis not present

## 2015-01-22 DIAGNOSIS — N183 Chronic kidney disease, stage 3 (moderate): Secondary | ICD-10-CM | POA: Diagnosis not present

## 2015-01-26 ENCOUNTER — Ambulatory Visit (HOSPITAL_COMMUNITY)
Admission: RE | Admit: 2015-01-26 | Discharge: 2015-01-26 | Disposition: A | Discharge status: Home / Self Care | Payer: Commercial Managed Care - HMO | Source: Ambulatory Visit | Attending: Nephrology | Admitting: Nephrology

## 2015-01-26 DIAGNOSIS — N184 Chronic kidney disease, stage 4 (severe): Secondary | ICD-10-CM | POA: Diagnosis not present

## 2015-01-26 DIAGNOSIS — D631 Anemia in chronic kidney disease: Secondary | ICD-10-CM | POA: Insufficient documentation

## 2015-01-26 LAB — POCT HEMOGLOBIN-HEMACUE: HEMOGLOBIN: 11.7 g/dL — AB (ref 13.0–17.0)

## 2015-01-26 MED ORDER — EPOETIN ALFA 40000 UNIT/ML IJ SOLN: 30000.0000 [IU] | INTRAMUSCULAR | Status: DC

## 2015-01-26 MED ORDER — EPOETIN ALFA 10000 UNIT/ML IJ SOLN
INTRAMUSCULAR | Status: AC
  Administered 2015-01-26: 10000 [IU]
  Filled 2015-01-26: qty 1

## 2015-01-26 MED ORDER — EPOETIN ALFA 20000 UNIT/ML IJ SOLN
INTRAMUSCULAR | Status: AC
  Administered 2015-01-26: 20000 [IU]
  Filled 2015-01-26: qty 1

## 2015-01-27 DIAGNOSIS — I129 Hypertensive chronic kidney disease with stage 1 through stage 4 chronic kidney disease, or unspecified chronic kidney disease: Secondary | ICD-10-CM | POA: Diagnosis not present

## 2015-01-27 DIAGNOSIS — D631 Anemia in chronic kidney disease: Secondary | ICD-10-CM | POA: Diagnosis not present

## 2015-01-27 DIAGNOSIS — N2581 Secondary hyperparathyroidism of renal origin: Secondary | ICD-10-CM | POA: Diagnosis not present

## 2015-01-27 DIAGNOSIS — N184 Chronic kidney disease, stage 4 (severe): Secondary | ICD-10-CM | POA: Diagnosis not present

## 2015-02-08 ENCOUNTER — Other Ambulatory Visit (HOSPITAL_COMMUNITY): Payer: Self-pay | Admitting: *Deleted

## 2015-02-09 ENCOUNTER — Encounter (HOSPITAL_COMMUNITY)
Admission: RE | Admit: 2015-02-09 | Discharge: 2015-02-09 | Disposition: A | Discharge status: Home / Self Care | Payer: Commercial Managed Care - HMO | Source: Ambulatory Visit | Attending: Nephrology | Admitting: Nephrology

## 2015-02-09 DIAGNOSIS — N184 Chronic kidney disease, stage 4 (severe): Secondary | ICD-10-CM | POA: Insufficient documentation

## 2015-02-09 LAB — RENAL FUNCTION PANEL
ALBUMIN: 3.6 g/dL (ref 3.5–5.2)
Anion gap: 5 (ref 5–15)
BUN: 30 mg/dL — ABNORMAL HIGH (ref 6–23)
CHLORIDE: 107 mmol/L (ref 96–112)
CO2: 27 mmol/L (ref 19–32)
Calcium: 9.5 mg/dL (ref 8.4–10.5)
Creatinine, Ser: 3.22 mg/dL — ABNORMAL HIGH (ref 0.50–1.35)
GFR, EST AFRICAN AMERICAN: 21 mL/min — AB (ref >90)
GFR, EST NON AFRICAN AMERICAN: 18 mL/min — AB (ref >90)
Glucose, Bld: 119 mg/dL — ABNORMAL HIGH (ref 70–99)
Phosphorus: 3.9 mg/dL (ref 2.3–4.6)
Potassium: 4 mmol/L (ref 3.5–5.1)
SODIUM: 139 mmol/L (ref 135–145)

## 2015-02-09 LAB — IRON AND TIBC
Iron: 42 ug/dL (ref 42–165)
SATURATION RATIOS: 20 % (ref 20–55)
TIBC: 209 ug/dL — ABNORMAL LOW (ref 215–435)
UIBC: 167 ug/dL (ref 125–400)

## 2015-02-09 LAB — FERRITIN: Ferritin: 599 ng/mL — ABNORMAL HIGH (ref 22–322)

## 2015-02-09 LAB — POCT HEMOGLOBIN-HEMACUE: Hemoglobin: 12.6 g/dL — ABNORMAL LOW (ref 13.0–17.0)

## 2015-02-09 LAB — MAGNESIUM: MAGNESIUM: 2 mg/dL (ref 1.5–2.5)

## 2015-02-09 MED ORDER — EPOETIN ALFA 40000 UNIT/ML IJ SOLN: 30000.0000 [IU] | INTRAMUSCULAR | Status: DC

## 2015-02-23 ENCOUNTER — Encounter (HOSPITAL_COMMUNITY)
Admission: RE | Admit: 2015-02-23 | Discharge: 2015-02-23 | Disposition: A | Discharge status: Home / Self Care | Payer: Commercial Managed Care - HMO | Source: Ambulatory Visit | Attending: Nephrology | Admitting: Nephrology

## 2015-02-23 DIAGNOSIS — N184 Chronic kidney disease, stage 4 (severe): Secondary | ICD-10-CM

## 2015-02-23 LAB — POCT HEMOGLOBIN-HEMACUE: Hemoglobin: 12.3 g/dL — ABNORMAL LOW (ref 13.0–17.0)

## 2015-02-23 MED ORDER — EPOETIN ALFA 40000 UNIT/ML IJ SOLN: 30000.0000 [IU] | INTRAMUSCULAR | Status: DC

## 2015-03-03 ENCOUNTER — Other Ambulatory Visit: Payer: Self-pay | Admitting: Cardiovascular Disease

## 2015-03-03 NOTE — Telephone Encounter (Signed)
Rx(s) sent to pharmacy electronically.  

## 2015-03-09 ENCOUNTER — Encounter (HOSPITAL_COMMUNITY)
Admission: RE | Admit: 2015-03-09 | Discharge: 2015-03-09 | Disposition: A | Discharge status: Home / Self Care | Payer: Commercial Managed Care - HMO | Source: Ambulatory Visit | Attending: Nephrology | Admitting: Nephrology

## 2015-03-09 DIAGNOSIS — N184 Chronic kidney disease, stage 4 (severe): Secondary | ICD-10-CM | POA: Insufficient documentation

## 2015-03-09 LAB — RENAL FUNCTION PANEL
ANION GAP: 4 — AB (ref 5–15)
Albumin: 3.8 g/dL (ref 3.5–5.2)
BUN: 32 mg/dL — AB (ref 6–23)
CALCIUM: 9.2 mg/dL (ref 8.4–10.5)
CO2: 29 mmol/L (ref 19–32)
CREATININE: 3.39 mg/dL — AB (ref 0.50–1.35)
Chloride: 107 mmol/L (ref 96–112)
GFR calc Af Amer: 19 mL/min — ABNORMAL LOW (ref >90)
GFR calc non Af Amer: 17 mL/min — ABNORMAL LOW (ref >90)
GLUCOSE: 109 mg/dL — AB (ref 70–99)
Phosphorus: 4.5 mg/dL (ref 2.3–4.6)
Potassium: 4.7 mmol/L (ref 3.5–5.1)
Sodium: 140 mmol/L (ref 135–145)

## 2015-03-09 LAB — IRON AND TIBC
Iron: 79 ug/dL (ref 42–165)
Saturation Ratios: 34 % (ref 20–55)
TIBC: 229 ug/dL (ref 215–435)
UIBC: 150 ug/dL (ref 125–400)

## 2015-03-09 LAB — POCT HEMOGLOBIN-HEMACUE: Hemoglobin: 11.8 g/dL — ABNORMAL LOW (ref 13.0–17.0)

## 2015-03-09 LAB — MAGNESIUM: MAGNESIUM: 1.9 mg/dL (ref 1.5–2.5)

## 2015-03-09 LAB — FERRITIN: FERRITIN: 701 ng/mL — AB (ref 22–322)

## 2015-03-09 MED ORDER — EPOETIN ALFA 40000 UNIT/ML IJ SOLN
30000.0000 [IU] | INTRAMUSCULAR | Status: DC
Start: 2015-03-09 — End: 2015-03-10

## 2015-03-09 MED ORDER — EPOETIN ALFA 10000 UNIT/ML IJ SOLN
INTRAMUSCULAR | Status: AC
  Administered 2015-03-09: 10000 [IU] via SUBCUTANEOUS
  Filled 2015-03-09: qty 1

## 2015-03-09 MED ORDER — EPOETIN ALFA 20000 UNIT/ML IJ SOLN
INTRAMUSCULAR | Status: AC
  Administered 2015-03-09: 20000 [IU] via SUBCUTANEOUS
  Filled 2015-03-09: qty 1

## 2015-03-23 ENCOUNTER — Encounter (HOSPITAL_COMMUNITY)
Admission: RE | Admit: 2015-03-23 | Discharge: 2015-03-23 | Disposition: A | Discharge status: Home / Self Care | Payer: Commercial Managed Care - HMO | Source: Ambulatory Visit | Attending: Nephrology | Admitting: Nephrology

## 2015-03-23 DIAGNOSIS — N184 Chronic kidney disease, stage 4 (severe): Secondary | ICD-10-CM | POA: Diagnosis not present

## 2015-03-23 LAB — POCT HEMOGLOBIN-HEMACUE: HEMOGLOBIN: 11.4 g/dL — AB (ref 13.0–17.0)

## 2015-03-23 MED ORDER — EPOETIN ALFA 10000 UNIT/ML IJ SOLN
INTRAMUSCULAR | Status: AC
  Administered 2015-03-23: 10000 [IU]
  Filled 2015-03-23: qty 1

## 2015-03-23 MED ORDER — EPOETIN ALFA 40000 UNIT/ML IJ SOLN: 30000.0000 [IU] | INTRAMUSCULAR | Status: DC

## 2015-03-23 MED ORDER — EPOETIN ALFA 20000 UNIT/ML IJ SOLN
INTRAMUSCULAR | Status: AC
  Administered 2015-03-23: 20000 [IU]
  Filled 2015-03-23: qty 1

## 2015-03-26 ENCOUNTER — Other Ambulatory Visit: Payer: Self-pay | Admitting: Infectious Disease

## 2015-03-26 DIAGNOSIS — B20 Human immunodeficiency virus [HIV] disease: Secondary | ICD-10-CM

## 2015-04-06 ENCOUNTER — Encounter (HOSPITAL_COMMUNITY)
Admission: RE | Admit: 2015-04-06 | Discharge: 2015-04-06 | Disposition: A | Discharge status: Home / Self Care | Payer: Commercial Managed Care - HMO | Source: Ambulatory Visit | Attending: Nephrology | Admitting: Nephrology

## 2015-04-06 DIAGNOSIS — N184 Chronic kidney disease, stage 4 (severe): Secondary | ICD-10-CM | POA: Insufficient documentation

## 2015-04-06 LAB — RENAL FUNCTION PANEL
ALBUMIN: 4 g/dL (ref 3.5–5.0)
Anion gap: 9 (ref 5–15)
BUN: 42 mg/dL — ABNORMAL HIGH (ref 6–20)
CHLORIDE: 108 mmol/L (ref 101–111)
CO2: 25 mmol/L (ref 22–32)
Calcium: 9.6 mg/dL (ref 8.9–10.3)
Creatinine, Ser: 4.1 mg/dL — ABNORMAL HIGH (ref 0.61–1.24)
GFR, EST AFRICAN AMERICAN: 15 mL/min — AB (ref >60)
GFR, EST NON AFRICAN AMERICAN: 13 mL/min — AB (ref >60)
Glucose, Bld: 117 mg/dL — ABNORMAL HIGH (ref 70–99)
POTASSIUM: 5 mmol/L (ref 3.5–5.1)
Phosphorus: 3.7 mg/dL (ref 2.5–4.6)
Sodium: 142 mmol/L (ref 135–145)

## 2015-04-06 LAB — FERRITIN: Ferritin: 438 ng/mL — ABNORMAL HIGH (ref 24–336)

## 2015-04-06 LAB — IRON AND TIBC
Iron: 39 ug/dL — ABNORMAL LOW (ref 45–182)
Saturation Ratios: 16 % — ABNORMAL LOW (ref 17.9–39.5)
TIBC: 241 ug/dL — ABNORMAL LOW (ref 250–450)
UIBC: 202 ug/dL

## 2015-04-06 LAB — MAGNESIUM: Magnesium: 2.2 mg/dL (ref 1.7–2.4)

## 2015-04-06 LAB — POCT HEMOGLOBIN-HEMACUE: HEMOGLOBIN: 12.5 g/dL — AB (ref 13.0–17.0)

## 2015-04-06 MED ORDER — EPOETIN ALFA 40000 UNIT/ML IJ SOLN: 30000.0000 [IU] | INTRAMUSCULAR | Status: DC

## 2015-04-07 DIAGNOSIS — H5703 Miosis: Secondary | ICD-10-CM | POA: Diagnosis not present

## 2015-04-07 DIAGNOSIS — H2511 Age-related nuclear cataract, right eye: Secondary | ICD-10-CM | POA: Diagnosis not present

## 2015-04-07 DIAGNOSIS — H4011X0 Primary open-angle glaucoma, stage unspecified: Secondary | ICD-10-CM | POA: Diagnosis not present

## 2015-04-19 ENCOUNTER — Other Ambulatory Visit (HOSPITAL_COMMUNITY): Payer: Self-pay | Admitting: *Deleted

## 2015-04-20 ENCOUNTER — Encounter (HOSPITAL_COMMUNITY)
Admission: RE | Admit: 2015-04-20 | Discharge: 2015-04-20 | Disposition: A | Discharge status: Home / Self Care | Payer: Commercial Managed Care - HMO | Source: Ambulatory Visit | Attending: Nephrology | Admitting: Nephrology

## 2015-04-20 DIAGNOSIS — N184 Chronic kidney disease, stage 4 (severe): Secondary | ICD-10-CM | POA: Diagnosis not present

## 2015-04-20 LAB — POCT HEMOGLOBIN-HEMACUE: Hemoglobin: 12.6 g/dL — ABNORMAL LOW (ref 13.0–17.0)

## 2015-04-20 MED ORDER — EPOETIN ALFA 40000 UNIT/ML IJ SOLN: 30000.0000 [IU] | INTRAMUSCULAR | Status: DC

## 2015-04-20 MED ORDER — FERUMOXYTOL INJECTION 510 MG/17 ML
510.0000 mg | INTRAVENOUS | Status: DC
  Administered 2015-04-20: 510 mg via INTRAVENOUS
  Filled 2015-04-20: qty 17

## 2015-04-26 DIAGNOSIS — H25811 Combined forms of age-related cataract, right eye: Secondary | ICD-10-CM | POA: Diagnosis not present

## 2015-04-26 DIAGNOSIS — H2511 Age-related nuclear cataract, right eye: Secondary | ICD-10-CM | POA: Diagnosis not present

## 2015-04-28 DIAGNOSIS — I129 Hypertensive chronic kidney disease with stage 1 through stage 4 chronic kidney disease, or unspecified chronic kidney disease: Secondary | ICD-10-CM | POA: Diagnosis not present

## 2015-04-28 DIAGNOSIS — N2581 Secondary hyperparathyroidism of renal origin: Secondary | ICD-10-CM | POA: Diagnosis not present

## 2015-04-28 DIAGNOSIS — N184 Chronic kidney disease, stage 4 (severe): Secondary | ICD-10-CM | POA: Diagnosis not present

## 2015-04-28 DIAGNOSIS — D631 Anemia in chronic kidney disease: Secondary | ICD-10-CM | POA: Diagnosis not present

## 2015-05-04 ENCOUNTER — Encounter (HOSPITAL_COMMUNITY)
Admission: RE | Admit: 2015-05-04 | Discharge: 2015-05-04 | Disposition: A | Discharge status: Home / Self Care | Payer: Commercial Managed Care - HMO | Source: Ambulatory Visit | Attending: Nephrology | Admitting: Nephrology

## 2015-05-04 DIAGNOSIS — Z5181 Encounter for therapeutic drug level monitoring: Secondary | ICD-10-CM | POA: Diagnosis not present

## 2015-05-04 DIAGNOSIS — Z79899 Other long term (current) drug therapy: Secondary | ICD-10-CM | POA: Diagnosis not present

## 2015-05-04 DIAGNOSIS — D509 Iron deficiency anemia, unspecified: Secondary | ICD-10-CM | POA: Insufficient documentation

## 2015-05-04 DIAGNOSIS — N184 Chronic kidney disease, stage 4 (severe): Secondary | ICD-10-CM | POA: Insufficient documentation

## 2015-05-04 LAB — RENAL FUNCTION PANEL
ALBUMIN: 3.6 g/dL (ref 3.5–5.0)
ANION GAP: 8 (ref 5–15)
BUN: 42 mg/dL — AB (ref 6–20)
CO2: 23 mmol/L (ref 22–32)
CREATININE: 3.75 mg/dL — AB (ref 0.61–1.24)
Calcium: 9 mg/dL (ref 8.9–10.3)
Chloride: 107 mmol/L (ref 101–111)
GFR calc Af Amer: 17 mL/min — ABNORMAL LOW (ref >60)
GFR, EST NON AFRICAN AMERICAN: 15 mL/min — AB (ref >60)
Glucose, Bld: 95 mg/dL (ref 65–99)
PHOSPHORUS: 3.7 mg/dL (ref 2.5–4.6)
POTASSIUM: 4.7 mmol/L (ref 3.5–5.1)
Sodium: 138 mmol/L (ref 135–145)

## 2015-05-04 LAB — IRON AND TIBC
Iron: 106 ug/dL (ref 45–182)
SATURATION RATIOS: 50 % — AB (ref 17.9–39.5)
TIBC: 210 ug/dL — ABNORMAL LOW (ref 250–450)
UIBC: 104 ug/dL

## 2015-05-04 LAB — POCT HEMOGLOBIN-HEMACUE: Hemoglobin: 12 g/dL — ABNORMAL LOW (ref 13.0–17.0)

## 2015-05-04 LAB — FERRITIN: Ferritin: 618 ng/mL — ABNORMAL HIGH (ref 24–336)

## 2015-05-04 MED ORDER — EPOETIN ALFA 10000 UNIT/ML IJ SOLN: 20000.0000 [IU] | Freq: Once | INTRAMUSCULAR | Status: DC

## 2015-05-04 MED ORDER — SODIUM CHLORIDE 0.9 % IV SOLN
510.0000 mg | INTRAVENOUS | Status: DC
  Administered 2015-05-04: 510 mg via INTRAVENOUS
  Filled 2015-05-04: qty 17

## 2015-05-04 MED ORDER — EPOETIN ALFA 40000 UNIT/ML IJ SOLN: 30000.0000 [IU] | INTRAMUSCULAR | Status: DC

## 2015-05-05 LAB — PTH, INTACT AND CALCIUM
Calcium, Total (PTH): 10.1 mg/dL (ref 8.6–10.2)
PTH: 46 pg/mL (ref 15–65)

## 2015-05-06 ENCOUNTER — Other Ambulatory Visit: Payer: Self-pay

## 2015-05-06 DIAGNOSIS — N184 Chronic kidney disease, stage 4 (severe): Secondary | ICD-10-CM

## 2015-05-06 DIAGNOSIS — Z01810 Encounter for preprocedural cardiovascular examination: Secondary | ICD-10-CM

## 2015-05-17 ENCOUNTER — Other Ambulatory Visit: Payer: Commercial Managed Care - HMO

## 2015-05-17 DIAGNOSIS — H2512 Age-related nuclear cataract, left eye: Secondary | ICD-10-CM | POA: Diagnosis not present

## 2015-05-17 DIAGNOSIS — B20 Human immunodeficiency virus [HIV] disease: Secondary | ICD-10-CM

## 2015-05-17 LAB — CBC WITH DIFFERENTIAL/PLATELET
Basophils Absolute: 0 10*3/uL (ref 0.0–0.1)
Basophils Relative: 1 % (ref 0–1)
EOS ABS: 0.3 10*3/uL (ref 0.0–0.7)
Eosinophils Relative: 7 % — ABNORMAL HIGH (ref 0–5)
HEMATOCRIT: 35.1 % — AB (ref 39.0–52.0)
HEMOGLOBIN: 11.5 g/dL — AB (ref 13.0–17.0)
LYMPHS PCT: 33 % (ref 12–46)
Lymphs Abs: 1.3 10*3/uL (ref 0.7–4.0)
MCH: 33.2 pg (ref 26.0–34.0)
MCHC: 32.8 g/dL (ref 30.0–36.0)
MCV: 101.4 fL — ABNORMAL HIGH (ref 78.0–100.0)
MPV: 9.4 fL (ref 8.6–12.4)
Monocytes Absolute: 0.3 10*3/uL (ref 0.1–1.0)
Monocytes Relative: 8 % (ref 3–12)
NEUTROS PCT: 51 % (ref 43–77)
Neutro Abs: 2 10*3/uL (ref 1.7–7.7)
Platelets: 189 10*3/uL (ref 150–400)
RBC: 3.46 MIL/uL — AB (ref 4.22–5.81)
RDW: 16 % — ABNORMAL HIGH (ref 11.5–15.5)
WBC: 4 10*3/uL (ref 4.0–10.5)

## 2015-05-17 LAB — COMPLETE METABOLIC PANEL WITH GFR
ALK PHOS: 83 U/L (ref 39–117)
ALT: 13 U/L (ref 0–53)
AST: 16 U/L (ref 0–37)
Albumin: 4 g/dL (ref 3.5–5.2)
BUN: 36 mg/dL — ABNORMAL HIGH (ref 6–23)
CO2: 23 mEq/L (ref 19–32)
Calcium: 9.3 mg/dL (ref 8.4–10.5)
Chloride: 108 mEq/L (ref 96–112)
Creat: 3.46 mg/dL — ABNORMAL HIGH (ref 0.50–1.35)
GFR, Est African American: 19 mL/min — ABNORMAL LOW
GFR, Est Non African American: 17 mL/min — ABNORMAL LOW
Glucose, Bld: 97 mg/dL (ref 70–99)
POTASSIUM: 4.8 meq/L (ref 3.5–5.3)
SODIUM: 142 meq/L (ref 135–145)
TOTAL PROTEIN: 7.1 g/dL (ref 6.0–8.3)
Total Bilirubin: 0.4 mg/dL (ref 0.2–1.2)

## 2015-05-18 ENCOUNTER — Other Ambulatory Visit (HOSPITAL_COMMUNITY)
Admission: RE | Admit: 2015-05-18 | Discharge: 2015-05-18 | Disposition: A | Discharge status: Home / Self Care | Payer: Commercial Managed Care - HMO | Source: Ambulatory Visit | Attending: Infectious Disease | Admitting: Infectious Disease

## 2015-05-18 ENCOUNTER — Encounter (HOSPITAL_COMMUNITY)
Admission: RE | Admit: 2015-05-18 | Discharge: 2015-05-18 | Disposition: A | Discharge status: Home / Self Care | Payer: Commercial Managed Care - HMO | Source: Ambulatory Visit | Attending: Nephrology | Admitting: Nephrology

## 2015-05-18 DIAGNOSIS — Z113 Encounter for screening for infections with a predominantly sexual mode of transmission: Secondary | ICD-10-CM | POA: Insufficient documentation

## 2015-05-18 DIAGNOSIS — N184 Chronic kidney disease, stage 4 (severe): Secondary | ICD-10-CM | POA: Insufficient documentation

## 2015-05-18 LAB — T-HELPER CELL (CD4) - (RCID CLINIC ONLY)
CD4 T CELL HELPER: 15 % — AB (ref 33–55)
CD4 T Cell Abs: 170 /uL — ABNORMAL LOW (ref 400–2700)

## 2015-05-18 LAB — HIV-1 RNA QUANT-NO REFLEX-BLD
HIV 1 RNA Quant: <20 copies/mL (ref <20)
HIV-1 RNA Quant, Log: <1.3 {Log} (ref <1.30)

## 2015-05-18 LAB — RPR TITER

## 2015-05-18 LAB — FLUORESCENT TREPONEMAL AB(FTA)-IGG-BLD: FLUORESCENT TREPONEMAL ABS: REACTIVE — AB

## 2015-05-18 LAB — POCT HEMOGLOBIN-HEMACUE: Hemoglobin: 11.5 g/dL — ABNORMAL LOW (ref 13.0–17.0)

## 2015-05-18 LAB — RPR: RPR Ser Ql: REACTIVE — AB

## 2015-05-18 MED ORDER — EPOETIN ALFA 20000 UNIT/ML IJ SOLN: 20000.0000 [IU] | INTRAMUSCULAR | Status: DC

## 2015-05-18 NOTE — Addendum Note (Signed)
Addended by: Dolan Amen D on: 05/18/2015 09:19 AM   Modules accepted: Orders

## 2015-05-19 ENCOUNTER — Telehealth: Payer: Self-pay

## 2015-05-19 ENCOUNTER — Telehealth: Payer: Self-pay | Admitting: Infectious Disease

## 2015-05-19 LAB — URINE CYTOLOGY ANCILLARY ONLY
Chlamydia: NEGATIVE
NEISSERIA GONORRHEA: NEGATIVE

## 2015-05-19 NOTE — Telephone Encounter (Signed)
Per Dr Tommy Medal message left on pateint;s machine to call the office.   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

## 2015-05-19 NOTE — Telephone Encounter (Signed)
I am still out of the office and in [place] but looked at David [name]'s labs and he needs IM PCN shot 2.4 MU weekly x 3  Want to make sure we get this set up  I will be back late THursday and can handle questions on Friday

## 2015-05-19 NOTE — Telephone Encounter (Signed)
Patient returned call. He has appointment scheduled on 05-26-15 and would like to start injections then.    He states he is asymptomatic at this time. I have advised him to use condoms with each sexual encounter.   Laverle Patter, RN   Date given for injections:  05-26-15, 06-02-15, 06-09-15.   Laverle Patter, RN

## 2015-05-24 DIAGNOSIS — H25812 Combined forms of age-related cataract, left eye: Secondary | ICD-10-CM | POA: Diagnosis not present

## 2015-05-24 DIAGNOSIS — H2512 Age-related nuclear cataract, left eye: Secondary | ICD-10-CM | POA: Diagnosis not present

## 2015-05-26 ENCOUNTER — Encounter: Payer: Self-pay | Admitting: Infectious Disease

## 2015-05-26 ENCOUNTER — Telehealth: Payer: Self-pay | Admitting: Family

## 2015-05-26 ENCOUNTER — Ambulatory Visit (INDEPENDENT_AMBULATORY_CARE_PROVIDER_SITE_OTHER): Payer: Commercial Managed Care - HMO | Admitting: Infectious Disease

## 2015-05-26 VITALS — BP 154/83 | HR 53 | Temp 97.8°F | Wt 199.0 lb

## 2015-05-26 DIAGNOSIS — I10 Essential (primary) hypertension: Secondary | ICD-10-CM

## 2015-05-26 DIAGNOSIS — N189 Chronic kidney disease, unspecified: Secondary | ICD-10-CM

## 2015-05-26 DIAGNOSIS — A529 Late syphilis, unspecified: Secondary | ICD-10-CM

## 2015-05-26 DIAGNOSIS — I129 Hypertensive chronic kidney disease with stage 1 through stage 4 chronic kidney disease, or unspecified chronic kidney disease: Secondary | ICD-10-CM

## 2015-05-26 DIAGNOSIS — N182 Chronic kidney disease, stage 2 (mild): Secondary | ICD-10-CM

## 2015-05-26 DIAGNOSIS — N184 Chronic kidney disease, stage 4 (severe): Secondary | ICD-10-CM

## 2015-05-26 DIAGNOSIS — N181 Chronic kidney disease, stage 1: Secondary | ICD-10-CM

## 2015-05-26 DIAGNOSIS — H109 Unspecified conjunctivitis: Secondary | ICD-10-CM

## 2015-05-26 DIAGNOSIS — B20 Human immunodeficiency virus [HIV] disease: Secondary | ICD-10-CM | POA: Diagnosis not present

## 2015-05-26 DIAGNOSIS — I5030 Unspecified diastolic (congestive) heart failure: Secondary | ICD-10-CM | POA: Diagnosis not present

## 2015-05-26 DIAGNOSIS — N183 Chronic kidney disease, stage 3 (moderate): Secondary | ICD-10-CM

## 2015-05-26 DIAGNOSIS — N289 Disorder of kidney and ureter, unspecified: Secondary | ICD-10-CM

## 2015-05-26 DIAGNOSIS — N185 Chronic kidney disease, stage 5: Secondary | ICD-10-CM

## 2015-05-26 MED ORDER — PENICILLIN G BENZATHINE 1200000 UNIT/2ML IM SUSP
1.2000 10*6.[IU] | Freq: Once | INTRAMUSCULAR | Status: AC
  Administered 2015-05-26: 1.2 10*6.[IU] via INTRAMUSCULAR

## 2015-05-26 NOTE — Telephone Encounter (Signed)
Patient scheduled appointment for 06/10/15. He did not want to come in this month.

## 2015-05-26 NOTE — Addendum Note (Signed)
Addended by: Jarrett Ables D on: 05/26/2015 10:42 AM   Modules accepted: Orders, Medications

## 2015-05-26 NOTE — Progress Notes (Signed)
Subjective:    Patient ID: David [name], male    DOB: 06-03-1941, 74 y.o.   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  His RPR came up to 1:16 from 1:1. She DOES have a sexual partner that is not his wife, another woman. He has not informed her of his HIV + status--which I have told him he must do but he claims he is using condoms 100% of the time.  Lab Results  Component Value Date   HIV1RNAQUANT <20 05/17/2015   Lab Results  Component Value Date   CD4TABS 170* 05/17/2015   CD4TABS 170* 12/16/2014   CD4TABS 190* 11/02/2014   .    Review of Systems  Constitutional: Negative for fever, chills, diaphoresis, activity change, appetite change, fatigue and unexpected weight change.  HENT: Negative for congestion, rhinorrhea, sinus pressure, sneezing, sore throat and trouble swallowing.   Respiratory: Negative for cough, chest tightness and shortness of breath.   Cardiovascular: Negative for chest pain, palpitations and leg swelling.  Gastrointestinal: Negative for nausea, vomiting, abdominal pain, constipation and abdominal distention.  Genitourinary: Negative for hematuria.  Musculoskeletal: Negative for myalgias.  Skin: Negative for color change, pallor, rash and wound.  Neurological: Negative for dizziness, tremors, weakness and light-headedness.  Hematological: Negative for adenopathy. Does not bruise/bleed easily.  Psychiatric/Behavioral: Negative for behavioral problems, confusion, sleep disturbance, decreased concentration and agitation.       Objective:   Physical Exam  Constitutional: He is oriented to person, place, and time. He appears well-developed and well-nourished. No distress.  HENT:  Head: Normocephalic and  atraumatic.  Mouth/Throat: Oropharynx is clear and moist. No oropharyngeal exudate.  Eyes: Conjunctivae and EOM are normal. Pupils are equal, round, and reactive to light. No scleral icterus.  Neck: Normal range of motion. Neck supple. No JVD present.  Cardiovascular: Normal rate, regular rhythm and normal heart sounds.  Exam reveals no gallop and no friction rub.   No murmur heard. Pulmonary/Chest: Effort normal and breath sounds normal. No respiratory distress. He has no wheezes. He has no rales. He exhibits no tenderness.  Abdominal: He exhibits no distension and no mass. There is no tenderness. There is no rebound and no guarding.  Musculoskeletal: He exhibits no edema or tenderness.  Lymphadenopathy:    He has no cervical adenopathy.  Neurological: He is alert and oriented to person, place, and time. He has normal reflexes. He exhibits normal muscle tone. Coordination normal.  Skin: Skin is warm and dry. He is not diaphoretic. No erythema. No pallor.  Psychiatric: He has a normal mood and affect. His behavior is normal. Judgment and thought content normal.          Assessment & Plan:   HIV/AIDS: continu TRIUMEQ.  Continue bactrim for now   I spent greater than 25 minutes with the patient including greater than 50% of time in face to face counsel of the patient re nature of HIV and STDs and in coordination of their care.  Syphilis: Titer was 1:168 in 2000 and sp successful treatment, but now up to 1:16 so will retreat with IM PCN weekly x 3 weeks, may need an LP if not coming up  Eye lesion: ? Has he been  seen again by Dr. 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

## 2015-05-26 NOTE — Telephone Encounter (Signed)
Please contact pt to arrange follow up. We need to check his BP.

## 2015-05-31 ENCOUNTER — Ambulatory Visit: Payer: Commercial Managed Care - HMO | Admitting: Infectious Disease

## 2015-06-01 ENCOUNTER — Encounter (HOSPITAL_COMMUNITY)
Admission: RE | Admit: 2015-06-01 | Discharge: 2015-06-01 | Disposition: A | Discharge status: Home / Self Care | Payer: Commercial Managed Care - HMO | Source: Ambulatory Visit | Attending: Nephrology | Admitting: Nephrology

## 2015-06-01 DIAGNOSIS — N184 Chronic kidney disease, stage 4 (severe): Secondary | ICD-10-CM

## 2015-06-01 LAB — RENAL FUNCTION PANEL
ALBUMIN: 4 g/dL (ref 3.5–5.0)
ANION GAP: 7 (ref 5–15)
BUN: 41 mg/dL — AB (ref 6–20)
CALCIUM: 9.1 mg/dL (ref 8.9–10.3)
CO2: 19 mmol/L — ABNORMAL LOW (ref 22–32)
Chloride: 113 mmol/L — ABNORMAL HIGH (ref 101–111)
Creatinine, Ser: 3.97 mg/dL — ABNORMAL HIGH (ref 0.61–1.24)
GFR calc non Af Amer: 14 mL/min — ABNORMAL LOW (ref >60)
GFR, EST AFRICAN AMERICAN: 16 mL/min — AB (ref >60)
Glucose, Bld: 119 mg/dL — ABNORMAL HIGH (ref 65–99)
PHOSPHORUS: 4.5 mg/dL (ref 2.5–4.6)
POTASSIUM: 4.3 mmol/L (ref 3.5–5.1)
SODIUM: 139 mmol/L (ref 135–145)

## 2015-06-01 LAB — IRON AND TIBC
Iron: 89 ug/dL (ref 45–182)
Saturation Ratios: 39 % (ref 17.9–39.5)
TIBC: 228 ug/dL — ABNORMAL LOW (ref 250–450)
UIBC: 139 ug/dL

## 2015-06-01 LAB — POCT HEMOGLOBIN-HEMACUE: Hemoglobin: 12.4 g/dL — ABNORMAL LOW (ref 13.0–17.0)

## 2015-06-01 LAB — FERRITIN: Ferritin: 1074 ng/mL — ABNORMAL HIGH (ref 24–336)

## 2015-06-01 MED ORDER — EPOETIN ALFA 20000 UNIT/ML IJ SOLN: 20000.0000 [IU] | INTRAMUSCULAR | Status: DC

## 2015-06-02 ENCOUNTER — Encounter: Payer: Self-pay | Admitting: Vascular Surgery

## 2015-06-02 ENCOUNTER — Ambulatory Visit (INDEPENDENT_AMBULATORY_CARE_PROVIDER_SITE_OTHER): Payer: Commercial Managed Care - HMO | Admitting: Licensed Clinical Social Worker

## 2015-06-02 DIAGNOSIS — A539 Syphilis, unspecified: Secondary | ICD-10-CM

## 2015-06-02 DIAGNOSIS — Z79899 Other long term (current) drug therapy: Secondary | ICD-10-CM | POA: Diagnosis not present

## 2015-06-02 DIAGNOSIS — E785 Hyperlipidemia, unspecified: Secondary | ICD-10-CM | POA: Diagnosis not present

## 2015-06-02 LAB — PTH, INTACT AND CALCIUM
Calcium, Total (PTH): 9 mg/dL (ref 8.6–10.2)
PTH: 66 pg/mL — AB (ref 15–65)

## 2015-06-02 MED ORDER — PENICILLIN G BENZATHINE 1200000 UNIT/2ML IM SUSP
1.2000 10*6.[IU] | Freq: Once | INTRAMUSCULAR | Status: AC
  Administered 2015-06-02: 1.2 10*6.[IU] via INTRAMUSCULAR

## 2015-06-03 LAB — COMPREHENSIVE METABOLIC PANEL
ALBUMIN: 4 g/dL (ref 3.5–5.2)
ALK PHOS: 73 U/L (ref 39–117)
ALT: 10 U/L (ref 0–53)
AST: 14 U/L (ref 0–37)
BILIRUBIN TOTAL: 0.4 mg/dL (ref 0.2–1.2)
BUN: 45 mg/dL — ABNORMAL HIGH (ref 6–23)
CO2: 19 meq/L (ref 19–32)
Calcium: 8.7 mg/dL (ref 8.4–10.5)
Chloride: 112 mEq/L (ref 96–112)
Creat: 3.74 mg/dL — ABNORMAL HIGH (ref 0.50–1.35)
Glucose, Bld: 101 mg/dL — ABNORMAL HIGH (ref 70–99)
POTASSIUM: 4.2 meq/L (ref 3.5–5.3)
Sodium: 141 mEq/L (ref 135–145)
Total Protein: 7.2 g/dL (ref 6.0–8.3)

## 2015-06-03 LAB — LIPID PANEL
Cholesterol: 157 mg/dL (ref 0–200)
HDL: 41 mg/dL (ref >=40)
LDL Cholesterol: 92 mg/dL (ref 0–99)
TRIGLYCERIDES: 121 mg/dL (ref <150)
Total CHOL/HDL Ratio: 3.8 Ratio
VLDL: 24 mg/dL (ref 0–40)

## 2015-06-08 ENCOUNTER — Other Ambulatory Visit: Payer: Self-pay

## 2015-06-08 ENCOUNTER — Ambulatory Visit (INDEPENDENT_AMBULATORY_CARE_PROVIDER_SITE_OTHER): Payer: Commercial Managed Care - HMO | Admitting: Vascular Surgery

## 2015-06-08 ENCOUNTER — Ambulatory Visit (HOSPITAL_COMMUNITY)
Admission: RE | Admit: 2015-06-08 | Discharge: 2015-06-08 | Disposition: A | Discharge status: Home / Self Care | Payer: Commercial Managed Care - HMO | Source: Ambulatory Visit | Attending: Vascular Surgery | Admitting: Vascular Surgery

## 2015-06-08 ENCOUNTER — Ambulatory Visit (INDEPENDENT_AMBULATORY_CARE_PROVIDER_SITE_OTHER)
Admission: RE | Admit: 2015-06-08 | Discharge: 2015-06-08 | Disposition: A | Discharge status: Home / Self Care | Payer: Commercial Managed Care - HMO | Source: Ambulatory Visit | Attending: Vascular Surgery | Admitting: Vascular Surgery

## 2015-06-08 ENCOUNTER — Encounter: Payer: Self-pay | Admitting: Vascular Surgery

## 2015-06-08 VITALS — BP 135/79 | HR 57 | Ht 74.0 in | Wt 191.3 lb

## 2015-06-08 DIAGNOSIS — Z01810 Encounter for preprocedural cardiovascular examination: Secondary | ICD-10-CM | POA: Insufficient documentation

## 2015-06-08 DIAGNOSIS — N184 Chronic kidney disease, stage 4 (severe): Secondary | ICD-10-CM | POA: Insufficient documentation

## 2015-06-08 NOTE — Progress Notes (Signed)
Subjective:     Patient ID: David [name], male   DOB: [birth year], 74 y.o.   MRN: [id number]  HPI this 74 year old male was referred by Dr. Graylon Gunning for evaluation of vascular access. Patient has not been on hemodialysis today. He is right-handed but uses his left upper extremity frequently. He states that his veins are very difficult to hit when blood is drawn.  Past Medical History  Diagnosis Date  . Hypertension     followed by [facility] and Vascular (Dr Dani Gobble Croitoru)  . Hyperlipidemia   . Cancer     hx of prostate; s/p radioactive seed implant 10/2009 Dr Janice Norrie  . 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 History of cardiac cath 2004    negative for CAD  . History of myocardial perfusion scan 02/2010    negative for coronary insufficiency (LVEF 27%)  . Anemia, iron deficiency 11/15/2011  . Nonischemic cardiomyopathy   . Sinus bradycardia   . HIV infection     History  Substance Use Topics  . Smoking status: Former Smoker    Quit date: 12/04/2005  . Smokeless tobacco: Never Used  . Alcohol Use: Yes     Comment: once a week-wine    Family History  Problem Relation Age of Onset  . Hypertension Mother   . Thyroid disease Mother   . Cholelithiasis Daughter   . Cholelithiasis Son   . Hypertension Maternal Grandmother   . Diabetes Maternal Grandmother   . Heart attack Neg Hx   . Hyperlipidemia Neg Hx     Allergies  Allergen Reactions  . Cozaar [Losartan Potassium] Other (See Comments)    constipation     Current outpatient prescriptions:  .  Abacavir-Dolutegravir-Lamivud (TRIUMEQ) 600-50-300 MG TABS, Take 1 tablet by mouth daily., Disp: 30 tablet, Rfl: 11 .  acetaZOLAMIDE (DIAMOX) 250 MG tablet, Take 250 mg by mouth 3 (three) times daily., Disp: , Rfl:  .  amLODipine (NORVASC) 10 MG tablet, Take 1 tablet (10 mg total) by mouth daily., Disp: 90 tablet, Rfl: 2 .  aspirin 325 MG EC tablet, Take 325 mg  by mouth daily.  , Disp: , Rfl:  .  brimonidine (ALPHAGAN) 0.15 % ophthalmic solution, Place 1 drop into the left eye 2 (two) times daily., Disp: , Rfl:  .  carvedilol (COREG) 25 MG tablet, Take 1 tablet (25 mg total) by mouth 2 (two) times daily., Disp: 180 tablet, Rfl: 3 .  dorzolamide-timolol (COSOPT) 22.3-6.8 MG/ML ophthalmic solution, Place 1 drop into the left eye 2 (two) times daily. , Disp: , Rfl:  .  ketorolac (ACULAR) 0.4 % SOLN, PLACE 1 DROP INTO THE SURGICAL EYE QID. START THE SUNDAY BEFORE SURGERY, Disp: , Rfl: 1 .  latanoprost (XALATAN) 0.005 % ophthalmic solution, Place 1 drop into the left eye at bedtime. , Disp: , Rfl:  .  loteprednol (LOTEMAX) 0.5 % ophthalmic suspension, 4 (four) times daily., Disp: , Rfl:  .  ofloxacin (OCUFLOX) 0.3 % ophthalmic solution, Place 1 drop into the left eye daily. , Disp: , Rfl: 0 .  sulfamethoxazole-trimethoprim (BACTRIM DS,SEPTRA DS) 800-160 MG per tablet, TAKE 1 TABLET BY MOUTH 3 TIMES A WEEK, Disp: 15 tablet, Rfl: 5 .  terazosin (HYTRIN) 10 MG capsule, Take 10 mg by mouth at bedtime. , Disp: , Rfl:  .  Vitamin D, Ergocalciferol, (DRISDOL) 50000 UNITS CAPS capsule, TK ONE C PO Q  WEEK, Disp: , Rfl:  2 .  acetaminophen (TYLENOL) 500 MG tablet, Take 500 mg by mouth 2 (two) times daily., Disp: , Rfl:  .  erythromycin ophthalmic ointment, Place 1 application into the right eye at bedtime., Disp: , Rfl:  .  NEOMYCIN-POLYMYXIN-HC, OPHTH, SUSP, Place 2 drops into both eyes 2 (two) times daily., Disp: , Rfl:  .  prednisoLONE sodium phosphate (INFLAMASE FORTE) 1 % ophthalmic solution, PLACE 1 DROP INTO THE SURGICAL EYE QID, Disp: , Rfl: 1  Filed Vitals:   06/08/15 0925  BP: 135/79  Pulse: 57  Height: 6\' 2"  (1.88 m)  Weight: 191 lb 4.8 oz (86.773 kg)  SpO2: 100%    Body mass index is 24.55 kg/(m^2).           Review of Systems denies chest pain, orthopnea, hemoptysis, claudication. Patient does have mild dyspnea on exertion. Has history of  HIV infection, nonischemic cardiomyopathy prostate cancer, other systems negative and complete review of systems     Objective:   Physical Exam BP 135/79 mmHg  Pulse 57  Ht 6\' 2"  (1.88 m)  Wt 191 lb 4.8 oz (86.773 kg)  BMI 24.55 kg/m2  SpO2 100%  Gen.-alert and oriented x3 in no apparent distress HEENT normal for age Lungs no rhonchi or wheezing Cardiovascular regular rhythm no murmurs carotid pulses 3+ palpable no bruits audible Abdomen soft nontender no palpable masses Musculoskeletal free of  major deformities Skin clear -no rashes Neurologic normal Lower extremities 3+ femoral and dorsalis pedis pulses palpable bilaterally with no edema  Today I ordered bilateral upper extremity vein mapping and arterial study. I reviewed this as well. Also performed independent SonoSite ultrasound exam at the bedside. Patient has thickened cephalic veins bilaterally which are inadequate for fistula creation. He does have a borderline left basilic vein which may be satisfactory for basilic vein transposition. Arterial flow is triphasic bilaterally.     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 If this is not feasible at time of surgery we'll plan left upper arm AV graft. Discussed this with patient and he would like to proceed.

## 2015-06-09 ENCOUNTER — Ambulatory Visit (INDEPENDENT_AMBULATORY_CARE_PROVIDER_SITE_OTHER): Payer: Commercial Managed Care - HMO | Admitting: *Deleted

## 2015-06-09 DIAGNOSIS — A539 Syphilis, unspecified: Secondary | ICD-10-CM | POA: Diagnosis not present

## 2015-06-09 MED ORDER — PENICILLIN G BENZATHINE 1200000 UNIT/2ML IM SUSP
1.2000 10*6.[IU] | Freq: Once | INTRAMUSCULAR | Status: AC
  Administered 2015-06-09: 1.2 10*6.[IU] via INTRAMUSCULAR

## 2015-06-10 ENCOUNTER — Other Ambulatory Visit: Payer: Self-pay | Admitting: *Deleted

## 2015-06-10 ENCOUNTER — Encounter: Payer: Self-pay | Admitting: Nephrology

## 2015-06-10 ENCOUNTER — Ambulatory Visit: Payer: Commercial Managed Care - HMO | Admitting: Family

## 2015-06-10 DIAGNOSIS — B20 Human immunodeficiency virus [HIV] disease: Secondary | ICD-10-CM

## 2015-06-10 DIAGNOSIS — H4089 Other specified glaucoma: Secondary | ICD-10-CM | POA: Diagnosis not present

## 2015-06-10 DIAGNOSIS — H4011X0 Primary open-angle glaucoma, stage unspecified: Secondary | ICD-10-CM | POA: Diagnosis not present

## 2015-06-10 MED ORDER — ABACAVIR-DOLUTEGRAVIR-LAMIVUD 600-50-300 MG PO TABS: 1.0000 | ORAL_TABLET | Freq: Every day | ORAL | Status: DC

## 2015-06-10 NOTE — Telephone Encounter (Signed)
ADAP Application 

## 2015-06-11 ENCOUNTER — Ambulatory Visit (INDEPENDENT_AMBULATORY_CARE_PROVIDER_SITE_OTHER): Payer: Commercial Managed Care - HMO | Admitting: Cardiovascular Disease

## 2015-06-11 ENCOUNTER — Encounter: Payer: Self-pay | Admitting: Cardiovascular Disease

## 2015-06-11 VITALS — BP 114/76 | HR 62 | Ht 74.0 in | Wt 193.0 lb

## 2015-06-11 DIAGNOSIS — I10 Essential (primary) hypertension: Secondary | ICD-10-CM

## 2015-06-11 DIAGNOSIS — N184 Chronic kidney disease, stage 4 (severe): Secondary | ICD-10-CM | POA: Diagnosis not present

## 2015-06-11 DIAGNOSIS — I420 Dilated cardiomyopathy: Secondary | ICD-10-CM

## 2015-06-11 DIAGNOSIS — I429 Cardiomyopathy, unspecified: Secondary | ICD-10-CM

## 2015-06-11 DIAGNOSIS — I5030 Unspecified diastolic (congestive) heart failure: Secondary | ICD-10-CM

## 2015-06-11 DIAGNOSIS — E785 Hyperlipidemia, unspecified: Secondary | ICD-10-CM

## 2015-06-11 NOTE — Progress Notes (Signed)
Patient ID: David [name], male   DOB: May 17, 1941, 74 y.o.   MRN: [id number]     Cardiology Office Note   Date:  06/12/2015   ID:  David [name], DOB [birth year], MRN [id number]  PCP:  [name], NP  Cardiologist:   [name], MD   Chief Complaint  Patient presents with  . 6 months    Patient has no complaints.      History of Present Illness: David [name] is a 74 y.o. male who presents for Hypertension, nonischemic dilated cardiomyopathy    David [name] continues to do well from a heart failure point of view and has no complaints of dyspnea or lower extremity edema. He has a long-standing history of nonischemic cardiomyopathy (normal coronary arteries by angiography in 2004 when he presented with an ejection fraction of 30%). With treatment of systemic hypertension left ventricular ejection fraction has increased to 45-50 percent. Additional problems include HIV/AIDS and advanced chronic kidney disease approaching end-stage renal status. Transposition of his basilic vein by Dr. Kellie Simmering on July 21. He is receiving erythropoietin shots intermittently. His hypertension and heart failure treated with carvedilol and amlodipine. He is not on renin-angiotensin-aldosterone inhibitors due to advanced kidney disease.  Past Medical History  Diagnosis Date  . Hypertension     followed by [facility] and Vascular (Dr Dani Gobble Jaquia Benedicto)  . Hyperlipidemia   . Cancer     hx of prostate; s/p radioactive seed implant 10/2009 Dr Janice Norrie  . 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 History of cardiac cath 2004    negative for CAD  . History of myocardial perfusion scan 02/2010    negative for coronary insufficiency (LVEF 27%)  . Anemia, iron deficiency 11/15/2011  . Nonischemic cardiomyopathy   . Sinus bradycardia   . HIV infection     Past Surgical History  Procedure Laterality Date  . Radioactive seed implant  2010   prostate cancer  . Cardiac catheterization  02/04/2003    mildly depressed LV systolic fx EF [id number] coronaries/abdominal aorta/renal arteries.  . US echocardiography  02/06/2012    mild LVH,LA mod. dilated,mild-mod. 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 AI.  Marland Kitchen Nm myocar perf wall motion  02/21/2010    normal  . Esophagogastroduodenoscopy (egd) with propofol N/A 05/05/2014    Procedure: ESOPHAGOGASTRODUODENOSCOPY (EGD) WITH PROPOFOL;  Surgeon: Missy Sabins, MD;  Location: [facility] ENDOSCOPY;  Service: Endoscopy;  Laterality: N/A;     Current Outpatient Prescriptions  Medication Sig Dispense Refill  . Abacavir-Dolutegravir-Lamivud (TRIUMEQ) 600-50-300 MG TABS Take 1 tablet by mouth daily. 30 tablet 11  . acetaminophen (TYLENOL) 500 MG tablet Take 500 mg by mouth 2 (two) times daily.    Marland Kitchen acetaZOLAMIDE (DIAMOX) 250 MG tablet Take 250 mg by mouth 3 (three) times daily.    Marland Kitchen amLODipine (NORVASC) 10 MG tablet Take 1 tablet (10 mg total) by mouth daily. 90 tablet 2  . aspirin 325 MG EC tablet Take 325 mg by mouth daily.      . brimonidine (ALPHAGAN) 0.15 % ophthalmic solution Place 1 drop into the left eye 2 (two) times daily.    . carvedilol (COREG) 25 MG tablet Take 1 tablet (25 mg total) by mouth 2 (two) times daily. 180 tablet 3  . dorzolamide-timolol (COSOPT) 22.3-6.8 MG/ML ophthalmic solution Place 1 drop into the left eye 2 (two) times daily.     Marland Kitchen erythromycin ophthalmic ointment  Place 1 application into the right eye at bedtime.    Marland Kitchen ketorolac (ACULAR) 0.4 % SOLN PLACE 1 DROP INTO THE SURGICAL EYE QID. START THE SUNDAY BEFORE SURGERY  1  . latanoprost (XALATAN) 0.005 % ophthalmic solution Place 1 drop into the left eye at bedtime.     Marland Kitchen loteprednol (LOTEMAX) 0.5 % ophthalmic suspension 4 (four) times daily.    . NEOMYCIN-POLYMYXIN-HC, OPHTH, SUSP Place 2 drops into both eyes 2 (two) times daily.    Marland Kitchen ofloxacin (OCUFLOX) 0.3 % ophthalmic solution Place 1 drop into the  left eye daily.   0  . prednisoLONE sodium phosphate (INFLAMASE FORTE) 1 % ophthalmic solution PLACE 1 DROP INTO THE SURGICAL EYE QID  1  . sulfamethoxazole-trimethoprim (BACTRIM DS,SEPTRA DS) 800-160 MG per tablet TAKE 1 TABLET BY MOUTH 3 TIMES A WEEK 15 tablet 5  . terazosin (HYTRIN) 10 MG capsule Take 10 mg by mouth at bedtime.     . Vitamin D, Ergocalciferol, (DRISDOL) 50000 UNITS CAPS capsule TK ONE C PO Q  WEEK  2   No current facility-administered medications for this visit.    Allergies:   Cozaar    Social History:  The patient  reports that he quit smoking about 9 years ago. He has never used smokeless tobacco. He reports that he drinks alcohol. He reports that he does not use illicit drugs.   Family History:  The patient's family history includes Cholelithiasis in his daughter and son; Diabetes in his maternal grandmother; Hypertension in his maternal grandmother and mother; Thyroid disease in his mother. There is no history of Heart attack or Hyperlipidemia.    ROS:  Please see the history of present illness.    Otherwise, review of systems positive for none.   All other systems are reviewed and negative.    PHYSICAL EXAM: VS:  BP 114/76 mmHg  Pulse 62  Ht 6\' 2"  (1.88 m)  Wt 193 lb (87.544 kg)  BMI 24.77 kg/m2 , BMI Body mass index is 24.77 kg/(m^2).  General: Alert, oriented x3, no distress Head: no evidence of trauma, PERRL, EOMI, no exophtalmos or lid lag, no myxedema, no xanthelasma; normal ears, nose and oropharynx Neck: normal jugular venous pulsations and no hepatojugular reflux; brisk carotid pulses without delay and no carotid bruits Chest: clear to auscultation, no signs of consolidation by percussion or palpation, normal fremitus, symmetrical and full respiratory excursions Cardiovascular: normal position and quality of the apical impulse, regular rhythm, normal first and second heart sounds, no murmurs, rubs or gallops Abdomen: no tenderness or distention, no  masses by palpation, no abnormal pulsatility or arterial bruits, normal bowel sounds, no hepatosplenomegaly Extremities: no clubbing, cyanosis or edema; 2+ radial, ulnar and brachial pulses bilaterally; 2+ right femoral, posterior tibial and dorsalis pedis pulses; 2+ left femoral, posterior tibial and dorsalis pedis pulses; no subclavian or femoral bruits Neurological: grossly nonfocal Psych: euthymic mood, full affect   EKG:  EKG is ordered today. The ekg ordered today demonstrates  Normal sinus rhythm, left anterior fascicular block   Recent Labs: 04/06/2015: Magnesium 2.2 05/17/2015: Platelets 189 06/01/2015: Hemoglobin 12.4* 06/02/2015: ALT 10; BUN 45*; Creat 3.74*; Potassium 4.2; Sodium 141    Lipid Panel    Component Value Date/Time   CHOL 157 06/02/2015 0844   TRIG 121 06/02/2015 0844   HDL 41 06/02/2015 0844   CHOLHDL 3.8 06/02/2015 0844   VLDL 24 06/02/2015 0844   LDLCALC 92 06/02/2015 0844      Wt Readings from  Last 3 Encounters:  06/11/15 193 lb (87.544 kg)  06/08/15 191 lb 4.8 oz (86.773 kg)  05/26/15 199 lb (90.266 kg)      ASSESSMENT AND PLAN:   Staley has well compensated mild left ventricular systolic dysfunction without overt findings of volume overload, functional class I. His blood pressure is well within the target range. No changes are made to his current medications.   his recent lipid profile showed all parameters in the desirable range , despite treatment with protease inhibitors.   Current medicines are reviewed at length with the patient today.  The patient does not have concerns regarding medicines.  The following changes have been made:  no change  Labs/ tests ordered today include:  Orders Placed This Encounter  Procedures  . EKG 12-Lead    Patient Instructions  [name] recommends that you schedule a follow-up appointment in: [place], Kekoa Fyock, MD  06/12/2015 9:53 AM    [name], MD, [facility]  [facility] [phone] office [phone] pager

## 2015-06-11 NOTE — Patient Instructions (Signed)
Dr. Croitoru recommends that you schedule a follow-up appointment in: 6 MONTHS   

## 2015-06-15 ENCOUNTER — Encounter (HOSPITAL_COMMUNITY)
Admission: RE | Admit: 2015-06-15 | Discharge: 2015-06-15 | Disposition: A | Discharge status: Home / Self Care | Payer: Commercial Managed Care - HMO | Source: Ambulatory Visit | Attending: Nephrology | Admitting: Nephrology

## 2015-06-15 DIAGNOSIS — N184 Chronic kidney disease, stage 4 (severe): Secondary | ICD-10-CM | POA: Insufficient documentation

## 2015-06-15 LAB — POCT HEMOGLOBIN-HEMACUE: HEMOGLOBIN: 11.4 g/dL — AB (ref 13.0–17.0)

## 2015-06-15 MED ORDER — EPOETIN ALFA 20000 UNIT/ML IJ SOLN
INTRAMUSCULAR | Status: AC
  Filled 2015-06-15: qty 1

## 2015-06-15 MED ORDER — EPOETIN ALFA 20000 UNIT/ML IJ SOLN
20000.0000 [IU] | INTRAMUSCULAR | Status: DC
  Administered 2015-06-15: 20000 [IU] via SUBCUTANEOUS

## 2015-06-21 NOTE — Progress Notes (Signed)
Phone call placed to [facility] to inform Dr. Posey Pronto that pt. is scheduled for a basilic vein transposition 06/24/15, and Dr. Kellie Simmering is questioning that if pt's blood vessels are too small, should he proceed with placing an arteriovenous graft at that time, or wait until pt. is closer to starting dialysis?  Per Alinda Sierras, Dr. Posey Pronto prefers to wait for AVG placement, until closer to starting HD, if unable to place fistula.  Dr. Kellie Simmering made aware of Dr. Serita Grit response.

## 2015-06-23 ENCOUNTER — Encounter (HOSPITAL_COMMUNITY): Payer: Self-pay | Admitting: *Deleted

## 2015-06-23 MED ORDER — SODIUM CHLORIDE 0.9 % IV SOLN: INTRAVENOUS | Status: DC

## 2015-06-23 MED ORDER — DEXTROSE 5 % IV SOLN
1.5000 g | INTRAVENOUS | Status: AC
  Administered 2015-06-24: 1.5 g via INTRAVENOUS
  Filled 2015-06-23: qty 1.5

## 2015-06-23 NOTE — Progress Notes (Signed)
   06/23/15 1242  [place]  Have you ever been diagnosed with sleep apnea through a sleep study? No  Do you snore loudly (loud enough to be heard through closed doors)?  1  Do you often feel tired, fatigued, or sleepy during the daytime? 0  Has anyone observed you stop breathing during your sleep? 0  Do you have, or are you being treated for high blood pressure? 1  BMI more than 35 kg/m2? 0  Age over 73 years old? 1  Neck circumference greater than 40 cm/16 inches? 1  Gender: 1

## 2015-06-24 ENCOUNTER — Ambulatory Visit (HOSPITAL_COMMUNITY): Payer: Medicare HMO | Admitting: Anesthesiology

## 2015-06-24 ENCOUNTER — Other Ambulatory Visit: Payer: Commercial Managed Care - HMO | Admitting: *Deleted

## 2015-06-24 ENCOUNTER — Encounter (HOSPITAL_COMMUNITY)
Admission: RE | Disposition: A | Discharge status: Home / Self Care | Payer: Self-pay | Source: Ambulatory Visit | Attending: Vascular Surgery

## 2015-06-24 ENCOUNTER — Ambulatory Visit (HOSPITAL_COMMUNITY)
Admission: RE | Admit: 2015-06-24 | Discharge: 2015-06-24 | Disposition: A | Discharge status: Home / Self Care | Payer: Medicare HMO | Source: Ambulatory Visit | Attending: Vascular Surgery | Admitting: Vascular Surgery

## 2015-06-24 ENCOUNTER — Encounter (HOSPITAL_COMMUNITY): Payer: Self-pay

## 2015-06-24 DIAGNOSIS — Z79899 Other long term (current) drug therapy: Secondary | ICD-10-CM | POA: Diagnosis not present

## 2015-06-24 DIAGNOSIS — Z4931 Encounter for adequacy testing for hemodialysis: Secondary | ICD-10-CM

## 2015-06-24 DIAGNOSIS — I5041 Acute combined systolic (congestive) and diastolic (congestive) heart failure: Secondary | ICD-10-CM | POA: Insufficient documentation

## 2015-06-24 DIAGNOSIS — I120 Hypertensive chronic kidney disease with stage 5 chronic kidney disease or end stage renal disease: Secondary | ICD-10-CM | POA: Diagnosis not present

## 2015-06-24 DIAGNOSIS — Z21 Asymptomatic human immunodeficiency virus [HIV] infection status: Secondary | ICD-10-CM | POA: Insufficient documentation

## 2015-06-24 DIAGNOSIS — C61 Malignant neoplasm of prostate: Secondary | ICD-10-CM | POA: Diagnosis not present

## 2015-06-24 DIAGNOSIS — Z87891 Personal history of nicotine dependence: Secondary | ICD-10-CM | POA: Insufficient documentation

## 2015-06-24 DIAGNOSIS — M1990 Unspecified osteoarthritis, unspecified site: Secondary | ICD-10-CM | POA: Diagnosis not present

## 2015-06-24 DIAGNOSIS — Z792 Long term (current) use of antibiotics: Secondary | ICD-10-CM | POA: Diagnosis not present

## 2015-06-24 DIAGNOSIS — Z7982 Long term (current) use of aspirin: Secondary | ICD-10-CM | POA: Diagnosis not present

## 2015-06-24 DIAGNOSIS — N186 End stage renal disease: Secondary | ICD-10-CM

## 2015-06-24 DIAGNOSIS — D509 Iron deficiency anemia, unspecified: Secondary | ICD-10-CM | POA: Diagnosis not present

## 2015-06-24 DIAGNOSIS — Z8673 Personal history of transient ischemic attack (TIA), and cerebral infarction without residual deficits: Secondary | ICD-10-CM | POA: Diagnosis not present

## 2015-06-24 HISTORY — DX: Cerebral infarction, unspecified: I63.9

## 2015-06-24 HISTORY — DX: Cardiac murmur, unspecified: R01.1

## 2015-06-24 HISTORY — PX: BASCILIC VEIN TRANSPOSITION: SHX5742

## 2015-06-24 LAB — POCT I-STAT 4, (NA,K, GLUC, HGB,HCT)
GLUCOSE: 104 mg/dL — AB (ref 65–99)
HCT: 35 % — ABNORMAL LOW (ref 39.0–52.0)
Hemoglobin: 11.9 g/dL — ABNORMAL LOW (ref 13.0–17.0)
Potassium: 4.3 mmol/L (ref 3.5–5.1)
Sodium: 143 mmol/L (ref 135–145)

## 2015-06-24 SURGERY — TRANSPOSITION, VEIN, BASILIC
Anesthesia: General | Laterality: Left

## 2015-06-24 MED ORDER — FENTANYL CITRATE (PF) 250 MCG/5ML IJ SOLN
INTRAMUSCULAR | Status: AC
  Filled 2015-06-24: qty 5

## 2015-06-24 MED ORDER — 0.9 % SODIUM CHLORIDE (POUR BTL) OPTIME
TOPICAL | Status: DC | PRN
  Administered 2015-06-24: 1000 mL

## 2015-06-24 MED ORDER — SODIUM CHLORIDE 0.9 % IV SOLN
INTRAVENOUS | Status: DC | PRN
  Administered 2015-06-24: 07:00:00 via INTRAVENOUS

## 2015-06-24 MED ORDER — EPHEDRINE SULFATE 50 MG/ML IJ SOLN
INTRAMUSCULAR | Status: AC
  Filled 2015-06-24: qty 1

## 2015-06-24 MED ORDER — PROPOFOL 10 MG/ML IV BOLUS
INTRAVENOUS | Status: AC
  Filled 2015-06-24: qty 20

## 2015-06-24 MED ORDER — SUCCINYLCHOLINE CHLORIDE 20 MG/ML IJ SOLN
INTRAMUSCULAR | Status: AC
Start: 2015-06-24 — End: 2015-06-24
  Filled 2015-06-24: qty 1

## 2015-06-24 MED ORDER — LIDOCAINE HCL (CARDIAC) 20 MG/ML IV SOLN
INTRAVENOUS | Status: DC | PRN
  Administered 2015-06-24: 70 mg via INTRAVENOUS

## 2015-06-24 MED ORDER — EPHEDRINE SULFATE 50 MG/ML IJ SOLN
INTRAMUSCULAR | Status: DC | PRN
  Administered 2015-06-24: 10 mg via INTRAVENOUS
  Administered 2015-06-24: 5 mg via INTRAVENOUS

## 2015-06-24 MED ORDER — LIDOCAINE-EPINEPHRINE (PF) 1 %-1:200000 IJ SOLN
INTRAMUSCULAR | Status: AC
  Filled 2015-06-24: qty 10

## 2015-06-24 MED ORDER — FENTANYL CITRATE (PF) 100 MCG/2ML IJ SOLN
INTRAMUSCULAR | Status: DC | PRN
  Administered 2015-06-24: 25 ug via INTRAVENOUS
  Administered 2015-06-24: 50 ug via INTRAVENOUS

## 2015-06-24 MED ORDER — ONDANSETRON HCL 4 MG/2ML IJ SOLN
INTRAMUSCULAR | Status: DC | PRN
  Administered 2015-06-24: 4 mg via INTRAVENOUS

## 2015-06-24 MED ORDER — SODIUM CHLORIDE 0.9 % IR SOLN
Status: DC | PRN
  Administered 2015-06-24: 07:00:00

## 2015-06-24 MED ORDER — PROPOFOL 10 MG/ML IV BOLUS
INTRAVENOUS | Status: DC | PRN
  Administered 2015-06-24: 200 mg via INTRAVENOUS

## 2015-06-24 MED ORDER — SODIUM CHLORIDE 0.9 % IJ SOLN
INTRAMUSCULAR | Status: AC
Start: 2015-06-24 — End: 2015-06-24
  Filled 2015-06-24: qty 10

## 2015-06-24 MED ORDER — OXYCODONE HCL 5 MG PO TABS: 5.0000 mg | ORAL_TABLET | Freq: Four times a day (QID) | ORAL | Status: DC | PRN

## 2015-06-24 SURGICAL SUPPLY — 27 items
CANISTER SUCTION 2500CC (MISCELLANEOUS) ×3 IMPLANT
CLIP TI MEDIUM 24 (CLIP) ×3 IMPLANT
CLIP TI WIDE RED SMALL 24 (CLIP) ×3 IMPLANT
COVER PROBE W GEL 5X96 (DRAPES) IMPLANT
ELECT CAUTERY BLADE 6.4 (BLADE) ×2 IMPLANT
ELECT REM PT RETURN 9FT ADLT (ELECTROSURGICAL) ×3
ELECTRODE REM PT RTRN 9FT ADLT (ELECTROSURGICAL) ×1 IMPLANT
GEL ULTRASOUND 20GR AQUASONIC (MISCELLANEOUS) IMPLANT
GLOVE SS BIOGEL STRL SZ 7 (GLOVE) ×1 IMPLANT
GLOVE SUPERSENSE BIOGEL SZ 7 (GLOVE) ×2
GOWN STRL REUS W/ TWL LRG LVL3 (GOWN DISPOSABLE) ×3 IMPLANT
GOWN STRL REUS W/TWL LRG LVL3 (GOWN DISPOSABLE) ×9
KIT BASIN OR (CUSTOM PROCEDURE TRAY) ×3 IMPLANT
KIT ROOM TURNOVER OR (KITS) ×3 IMPLANT
LIQUID BAND (GAUZE/BANDAGES/DRESSINGS) ×7 IMPLANT
NS IRRIG 1000ML POUR BTL (IV SOLUTION) ×3 IMPLANT
PACK CV ACCESS (CUSTOM PROCEDURE TRAY) ×3 IMPLANT
PAD ARMBOARD 7.5X6 YLW CONV (MISCELLANEOUS) ×6 IMPLANT
PENCIL BUTTON HOLSTER BLD 10FT (ELECTRODE) ×2 IMPLANT
SUT PROLENE 6 0 BV (SUTURE) ×3 IMPLANT
SUT PROLENE 7 0 BV 1 (SUTURE) ×2 IMPLANT
SUT SILK 2 0 SH (SUTURE) ×3 IMPLANT
SUT VIC AB 3-0 SH 27 (SUTURE) ×9
SUT VIC AB 3-0 SH 27X BRD (SUTURE) ×1 IMPLANT
SUT VICRYL 4-0 PS2 18IN ABS (SUTURE) ×2 IMPLANT
UNDERPAD 30X30 INCONTINENT (UNDERPADS AND DIAPERS) ×3 IMPLANT
WATER STERILE IRR 1000ML POUR (IV SOLUTION) ×3 IMPLANT

## 2015-06-24 NOTE — Anesthesia Preprocedure Evaluation (Addendum)
Anesthesia Evaluation  Patient identified by MRN, date of birth, ID band Patient awake    Reviewed: Allergy & Precautions, NPO status , Patient's Chart, lab work & pertinent test results  Airway Mallampati: II  TM Distance: >3 FB Neck ROM: full    Dental  (+) Teeth Intact, Dental Advisory Given   Pulmonary former smoker,  breath sounds clear to auscultation        Cardiovascular hypertension, +CHF Rhythm:regular Rate:Normal     Neuro/Psych CVA    GI/Hepatic   Endo/Other    Renal/GU Renal InsufficiencyRenal disease     Musculoskeletal  (+) Arthritis -,   Abdominal   Peds  Hematology  (+) HIV,   Anesthesia Other Findings   Reproductive/Obstetrics                            Anesthesia Physical Anesthesia Plan  ASA: III  Anesthesia Plan: MAC   Post-op Pain Management:    Induction: Intravenous  Airway Management Planned: Simple Face Mask  Additional Equipment:   Intra-op Plan:   Post-operative Plan:   Informed Consent: I have reviewed the patients History and Physical, chart, labs and discussed the procedure including the risks, benefits and alternatives for the proposed anesthesia with the patient or authorized representative who has indicated his/her understanding and acceptance.     Plan Discussed with: CRNA, Anesthesiologist and Surgeon  Anesthesia Plan Comments:         Anesthesia Quick Evaluation

## 2015-06-24 NOTE — Transfer of Care (Signed)
Immediate Anesthesia Transfer of Care Note  Patient: David [name]  Procedure(s) Performed: Procedure(s): BASILIC VEIN TRANSPOSITION (Left)  Patient Location: PACU  Anesthesia Type:General  Level of Consciousness: lethargic and responds to stimulation  Airway & Oxygen Therapy: Patient Spontanous Breathing and Patient connected to nasal cannula oxygen  Post-op Assessment: Report given to RN  Post vital signs: Reviewed and stable  Last Vitals:  Filed Vitals:   06/24/15 0958  BP:   Pulse: 65  Temp:   Resp: 17    Complications: No apparent anesthesia complications

## 2015-06-24 NOTE — H&P (View-Only) (Signed)
Subjective:     Patient ID: David [name], male   DOB: 1940/12/20, 74 y.o.   MRN: [id number]  HPI this 74 year old male was referred by Dr. Graylon Gunning for evaluation of vascular access. Patient has not been on hemodialysis today. He is right-handed but uses his left upper extremity frequently. He states that his veins are very difficult to hit when blood is drawn.  Past Medical History  Diagnosis Date  . Hypertension     followed by [facility] (Dr Dani Gobble Croitoru)  . Hyperlipidemia   . Cancer     hx of prostate; s/p radioactive seed implant 10/2009 Dr Janice Norrie  . 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 History of cardiac cath 2004    negative for CAD  . History of myocardial perfusion scan 02/2010    negative for coronary insufficiency (LVEF 27%)  . Anemia, iron deficiency 11/15/2011  . Nonischemic cardiomyopathy   . Sinus bradycardia   . HIV infection     History  Substance Use Topics  . Smoking status: Former Smoker    Quit date: 12/04/2005  . Smokeless tobacco: Never Used  . Alcohol Use: Yes     Comment: once a week-wine    Family History  Problem Relation Age of Onset  . Hypertension Mother   . Thyroid disease Mother   . Cholelithiasis Daughter   . Cholelithiasis Son   . Hypertension Maternal Grandmother   . Diabetes Maternal Grandmother   . Heart attack Neg Hx   . Hyperlipidemia Neg Hx     Allergies  Allergen Reactions  . Cozaar [Losartan Potassium] Other (See Comments)    constipation     Current outpatient prescriptions:  .  Abacavir-Dolutegravir-Lamivud (TRIUMEQ) 600-50-300 MG TABS, Take 1 tablet by mouth daily., Disp: 30 tablet, Rfl: 11 .  acetaZOLAMIDE (DIAMOX) 250 MG tablet, Take 250 mg by mouth 3 (three) times daily., Disp: , Rfl:  .  amLODipine (NORVASC) 10 MG tablet, Take 1 tablet (10 mg total) by mouth daily., Disp: 90 tablet, Rfl: 2 .  aspirin 325 MG EC tablet, Take 325 mg  by mouth daily.  , Disp: , Rfl:  .  brimonidine (ALPHAGAN) 0.15 % ophthalmic solution, Place 1 drop into the left eye 2 (two) times daily., Disp: , Rfl:  .  carvedilol (COREG) 25 MG tablet, Take 1 tablet (25 mg total) by mouth 2 (two) times daily., Disp: 180 tablet, Rfl: 3 .  dorzolamide-timolol (COSOPT) 22.3-6.8 MG/ML ophthalmic solution, Place 1 drop into the left eye 2 (two) times daily. , Disp: , Rfl:  .  ketorolac (ACULAR) 0.4 % SOLN, PLACE 1 DROP INTO THE SURGICAL EYE QID. START THE SUNDAY BEFORE SURGERY, Disp: , Rfl: 1 .  latanoprost (XALATAN) 0.005 % ophthalmic solution, Place 1 drop into the left eye at bedtime. , Disp: , Rfl:  .  loteprednol (LOTEMAX) 0.5 % ophthalmic suspension, 4 (four) times daily., Disp: , Rfl:  .  ofloxacin (OCUFLOX) 0.3 % ophthalmic solution, Place 1 drop into the left eye daily. , Disp: , Rfl: 0 .  sulfamethoxazole-trimethoprim (BACTRIM DS,SEPTRA DS) 800-160 MG per tablet, TAKE 1 TABLET BY MOUTH 3 TIMES A WEEK, Disp: 15 tablet, Rfl: 5 .  terazosin (HYTRIN) 10 MG capsule, Take 10 mg by mouth at bedtime. , Disp: , Rfl:  .  Vitamin D, Ergocalciferol, (DRISDOL) 50000 UNITS CAPS capsule, TK ONE C PO Q  WEEK, Disp: , Rfl:  2 .  acetaminophen (TYLENOL) 500 MG tablet, Take 500 mg by mouth 2 (two) times daily., Disp: , Rfl:  .  erythromycin ophthalmic ointment, Place 1 application into the right eye at bedtime., Disp: , Rfl:  .  NEOMYCIN-POLYMYXIN-HC, OPHTH, SUSP, Place 2 drops into both eyes 2 (two) times daily., Disp: , Rfl:  .  prednisoLONE sodium phosphate (INFLAMASE FORTE) 1 % ophthalmic solution, PLACE 1 DROP INTO THE SURGICAL EYE QID, Disp: , Rfl: 1  Filed Vitals:   06/08/15 0925  BP: 135/79  Pulse: 57  Height: 6\' 2"  (1.88 m)  Weight: 191 lb 4.8 oz (86.773 kg)  SpO2: 100%    Body mass index is 24.55 kg/(m^2).           Review of Systems denies chest pain, orthopnea, hemoptysis, claudication. Patient does have mild dyspnea on exertion. Has history of  HIV infection, nonischemic cardiomyopathy prostate cancer, other systems negative and complete review of systems     Objective:   Physical Exam BP 135/79 mmHg  Pulse 57  Ht 6\' 2"  (1.88 m)  Wt 191 lb 4.8 oz (86.773 kg)  BMI 24.55 kg/m2  SpO2 100%  Gen.-alert and oriented x3 in no apparent distress HEENT normal for age Lungs no rhonchi or wheezing Cardiovascular regular rhythm no murmurs carotid pulses 3+ palpable no bruits audible Abdomen soft nontender no palpable masses Musculoskeletal free of  major deformities Skin clear -no rashes Neurologic normal Lower extremities 3+ femoral and dorsalis pedis pulses palpable bilaterally with no edema  Today I ordered bilateral upper extremity vein mapping and arterial study. I reviewed this as well. Also performed independent SonoSite ultrasound exam at the bedside. Patient has thickened cephalic veins bilaterally which are inadequate for fistula creation. He does have a borderline left basilic vein which may be satisfactory for basilic vein transposition. Arterial flow is triphasic bilaterally.     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 If this is not feasible at time of surgery we'll plan left upper arm AV graft. Discussed this with patient and he would like to proceed.

## 2015-06-24 NOTE — Anesthesia Procedure Notes (Signed)
Procedure Name: LMA Insertion Date/Time: 06/24/2015 7:50 AM Performed by: Sampson Si E Pre-anesthesia Checklist: Patient identified, Emergency Drugs available, Suction available, Patient being monitored and Timeout performed Patient Re-evaluated:Patient Re-evaluated prior to inductionOxygen Delivery Method: Circle system utilized Preoxygenation: Pre-oxygenation with 100% oxygen Intubation Type: IV induction LMA: LMA inserted LMA Size: 5.0 Number of attempts: 1 Placement Confirmation: positive ETCO2 and breath sounds checked- equal and bilateral Tube secured with: Tape Dental Injury: Teeth and Oropharynx as per pre-operative assessment

## 2015-06-24 NOTE — Progress Notes (Signed)
Report given to Jerrol Banana, RN

## 2015-06-24 NOTE — Op Note (Signed)
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 The left upper extremity was prepped Betadine scrub and solution draped in routine sterile manner. Basilic vein was imaged with B-mode ultrasound-sono site and did appear to be satisfactory for basilic vein transposition although it did join the deep vein system in the mid upper arm. It was identified in the very medial aspect at the antecubital area. Was then mobilized to multiple incisions on the medial aspect of the left arm up to the axilla. It was a good quality vein. It was about 2-1/2 mm in size and is smallest area near the distal and Branches ligated with 3 and 4-0 silk ties divided ligated distally transected marked for orientation purposes. Brachial artery was then exposed through the distal vein harvesting incision it was encircled with vessel loop. Had an excellent pulse. Vein was then carefully tunneled along the anterior aspect of the arm using a curved tunneler delivered into the distal wound. Brachial artery was occluded proximally and distally with Vesseloops open 15 blade extended with Potts scissors. The vein was carefully spatulated and anastomosed end-to-side to the brachial artery with 60 proline. Vesseloops released there was a good pulse and thrill in the fistula. There was also audible radial and ulnar flow distally which decreased slightly with fistula open. Adequate hemostasis was achieved and wounds closed in layers with 5: Subcuticular fashion with Dermabond patient taken to the recovery room in satisfactory  condition   [name], MD 06/24/2015 10:07 AM

## 2015-06-24 NOTE — Interval H&P Note (Signed)
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 After consideration of risks, benefits and other options for treatment, the patient has consented to  Procedure(s): BASILIC VEIN TRANSPOSITION (Left) as a surgical intervention .  The patient's history has been reviewed, patient examined, no change in status, stable for surgery.  I have reviewed the patient's chart and labs.  Questions were answered to the patient's satisfaction.     [name]

## 2015-06-24 NOTE — Discharge Instructions (Signed)
° ° °  06/24/2015 MAHENDRA PRUITTE [id number] Jul 15, 1941  Surgeon(s): Mal Misty, MD  Procedure(s): BASILIC VEIN TRANSPOSITION-Left  x Do not stick fistula for 12 weeks

## 2015-06-25 ENCOUNTER — Encounter (HOSPITAL_COMMUNITY): Payer: Self-pay | Admitting: Vascular Surgery

## 2015-06-25 NOTE — Anesthesia Postprocedure Evaluation (Signed)
Anesthesia Post Note  Patient: David [name]  Procedure(s) Performed: Procedure(s) (LRB): BASILIC VEIN TRANSPOSITION (Left)  Anesthesia type: General  Patient location: PACU  Post pain: Pain level controlled and Adequate analgesia  Post assessment: Post-op Vital signs reviewed, Patient's Cardiovascular Status Stable, Respiratory Function Stable, Patent Airway and Pain level controlled  Last Vitals:  Filed Vitals:   06/24/15 1028  BP: 116/64  Pulse: 67  Temp:   Resp: 18    Post vital signs: Reviewed and stable  Level of consciousness: awake, alert  and oriented  Complications: No apparent anesthesia complications

## 2015-06-29 ENCOUNTER — Encounter (HOSPITAL_COMMUNITY)
Admission: RE | Admit: 2015-06-29 | Discharge: 2015-06-29 | Disposition: A | Discharge status: Home / Self Care | Payer: Commercial Managed Care - HMO | Source: Ambulatory Visit | Attending: Nephrology | Admitting: Nephrology

## 2015-06-29 DIAGNOSIS — N184 Chronic kidney disease, stage 4 (severe): Secondary | ICD-10-CM | POA: Diagnosis not present

## 2015-06-29 LAB — RENAL FUNCTION PANEL
Albumin: 3.4 g/dL — ABNORMAL LOW (ref 3.5–5.0)
Anion gap: 9 (ref 5–15)
BUN: 34 mg/dL — ABNORMAL HIGH (ref 6–20)
CALCIUM: 9 mg/dL (ref 8.9–10.3)
CO2: 22 mmol/L (ref 22–32)
CREATININE: 3.44 mg/dL — AB (ref 0.61–1.24)
Chloride: 106 mmol/L (ref 101–111)
GFR calc Af Amer: 19 mL/min — ABNORMAL LOW (ref >60)
GFR calc non Af Amer: 16 mL/min — ABNORMAL LOW (ref >60)
GLUCOSE: 142 mg/dL — AB (ref 65–99)
PHOSPHORUS: 4.4 mg/dL (ref 2.5–4.6)
POTASSIUM: 5.2 mmol/L — AB (ref 3.5–5.1)
SODIUM: 137 mmol/L (ref 135–145)

## 2015-06-29 LAB — IRON AND TIBC
Iron: 44 ug/dL — ABNORMAL LOW (ref 45–182)
Saturation Ratios: 23 % (ref 17.9–39.5)
TIBC: 193 ug/dL — AB (ref 250–450)
UIBC: 149 ug/dL

## 2015-06-29 LAB — POCT HEMOGLOBIN-HEMACUE: Hemoglobin: 11 g/dL — ABNORMAL LOW (ref 13.0–17.0)

## 2015-06-29 LAB — FERRITIN: FERRITIN: 612 ng/mL — AB (ref 24–336)

## 2015-06-29 MED ORDER — EPOETIN ALFA 20000 UNIT/ML IJ SOLN
INTRAMUSCULAR | Status: AC
  Filled 2015-06-29: qty 1

## 2015-06-29 MED ORDER — EPOETIN ALFA 20000 UNIT/ML IJ SOLN
20000.0000 [IU] | INTRAMUSCULAR | Status: DC
  Administered 2015-06-29: 20000 [IU] via SUBCUTANEOUS

## 2015-06-30 ENCOUNTER — Other Ambulatory Visit: Payer: Self-pay | Admitting: *Deleted

## 2015-06-30 DIAGNOSIS — B20 Human immunodeficiency virus [HIV] disease: Secondary | ICD-10-CM

## 2015-06-30 LAB — PTH, INTACT AND CALCIUM
Calcium, Total (PTH): 8.9 mg/dL (ref 8.6–10.2)
PTH: 36 pg/mL (ref 15–65)

## 2015-06-30 MED ORDER — ABACAVIR-DOLUTEGRAVIR-LAMIVUD 600-50-300 MG PO TABS: 1.0000 | ORAL_TABLET | Freq: Every day | ORAL | Status: DC

## 2015-06-30 NOTE — Progress Notes (Signed)
Patient notified

## 2015-07-13 ENCOUNTER — Encounter (HOSPITAL_COMMUNITY): Payer: Commercial Managed Care - HMO

## 2015-07-13 ENCOUNTER — Encounter (HOSPITAL_COMMUNITY)
Admission: RE | Admit: 2015-07-13 | Discharge: 2015-07-13 | Disposition: A | Discharge status: Home / Self Care | Payer: Commercial Managed Care - HMO | Source: Ambulatory Visit | Attending: Nephrology | Admitting: Nephrology

## 2015-07-13 DIAGNOSIS — N183 Chronic kidney disease, stage 3 (moderate): Secondary | ICD-10-CM | POA: Insufficient documentation

## 2015-07-13 DIAGNOSIS — Z5181 Encounter for therapeutic drug level monitoring: Secondary | ICD-10-CM | POA: Insufficient documentation

## 2015-07-13 DIAGNOSIS — D631 Anemia in chronic kidney disease: Secondary | ICD-10-CM | POA: Diagnosis not present

## 2015-07-13 DIAGNOSIS — Z79899 Other long term (current) drug therapy: Secondary | ICD-10-CM | POA: Diagnosis not present

## 2015-07-13 DIAGNOSIS — N184 Chronic kidney disease, stage 4 (severe): Secondary | ICD-10-CM

## 2015-07-13 LAB — POCT HEMOGLOBIN-HEMACUE: Hemoglobin: 12.5 g/dL — ABNORMAL LOW (ref 13.0–17.0)

## 2015-07-13 MED ORDER — EPOETIN ALFA 20000 UNIT/ML IJ SOLN: 20000.0000 [IU] | INTRAMUSCULAR | Status: DC

## 2015-07-27 ENCOUNTER — Encounter (HOSPITAL_COMMUNITY): Payer: Medicare HMO

## 2015-08-02 ENCOUNTER — Other Ambulatory Visit (HOSPITAL_COMMUNITY): Payer: Self-pay | Admitting: *Deleted

## 2015-08-03 ENCOUNTER — Encounter (HOSPITAL_COMMUNITY)
Admission: RE | Admit: 2015-08-03 | Discharge: 2015-08-03 | Disposition: A | Discharge status: Home / Self Care | Payer: Commercial Managed Care - HMO | Source: Ambulatory Visit | Attending: Nephrology | Admitting: Nephrology

## 2015-08-03 DIAGNOSIS — Z79899 Other long term (current) drug therapy: Secondary | ICD-10-CM | POA: Diagnosis not present

## 2015-08-03 DIAGNOSIS — Z5181 Encounter for therapeutic drug level monitoring: Secondary | ICD-10-CM | POA: Diagnosis not present

## 2015-08-03 DIAGNOSIS — N183 Chronic kidney disease, stage 3 (moderate): Secondary | ICD-10-CM | POA: Diagnosis not present

## 2015-08-03 DIAGNOSIS — N184 Chronic kidney disease, stage 4 (severe): Secondary | ICD-10-CM

## 2015-08-03 DIAGNOSIS — D631 Anemia in chronic kidney disease: Secondary | ICD-10-CM | POA: Diagnosis not present

## 2015-08-03 LAB — POCT HEMOGLOBIN-HEMACUE: Hemoglobin: 11.3 g/dL — ABNORMAL LOW (ref 13.0–17.0)

## 2015-08-03 MED ORDER — EPOETIN ALFA 20000 UNIT/ML IJ SOLN
20000.0000 [IU] | INTRAMUSCULAR | Status: DC
  Administered 2015-08-03: 20000 [IU] via SUBCUTANEOUS

## 2015-08-03 MED ORDER — EPOETIN ALFA 20000 UNIT/ML IJ SOLN
INTRAMUSCULAR | Status: AC
  Filled 2015-08-03: qty 1

## 2015-08-04 DIAGNOSIS — I129 Hypertensive chronic kidney disease with stage 1 through stage 4 chronic kidney disease, or unspecified chronic kidney disease: Secondary | ICD-10-CM | POA: Diagnosis not present

## 2015-08-04 DIAGNOSIS — D631 Anemia in chronic kidney disease: Secondary | ICD-10-CM | POA: Diagnosis not present

## 2015-08-04 DIAGNOSIS — N2581 Secondary hyperparathyroidism of renal origin: Secondary | ICD-10-CM | POA: Diagnosis not present

## 2015-08-04 DIAGNOSIS — N184 Chronic kidney disease, stage 4 (severe): Secondary | ICD-10-CM | POA: Diagnosis not present

## 2015-08-10 ENCOUNTER — Telehealth: Payer: Self-pay | Admitting: Family

## 2015-08-10 NOTE — Telephone Encounter (Signed)
[employer] # [id number]

## 2015-08-10 NOTE — Telephone Encounter (Signed)
Relation to pt: self  Call back [phone]   Reason for call:  Patient requesting a referral to [facility] appointment is scheduled for 08/11/15 in the morning, to check prostate

## 2015-08-10 NOTE — Telephone Encounter (Signed)
Pt is already established with [facility] and needs [facility] referral. Thanks!

## 2015-08-11 DIAGNOSIS — Z21 Asymptomatic human immunodeficiency virus [HIV] infection status: Secondary | ICD-10-CM | POA: Diagnosis not present

## 2015-08-11 DIAGNOSIS — C61 Malignant neoplasm of prostate: Secondary | ICD-10-CM | POA: Diagnosis not present

## 2015-08-11 DIAGNOSIS — N289 Disorder of kidney and ureter, unspecified: Secondary | ICD-10-CM | POA: Diagnosis not present

## 2015-08-24 ENCOUNTER — Inpatient Hospital Stay (HOSPITAL_COMMUNITY): Admission: RE | Admit: 2015-08-24 | Payer: Commercial Managed Care - HMO | Source: Ambulatory Visit

## 2015-08-25 ENCOUNTER — Encounter (HOSPITAL_COMMUNITY)
Admission: RE | Admit: 2015-08-25 | Discharge: 2015-08-25 | Disposition: A | Discharge status: Home / Self Care | Payer: Commercial Managed Care - HMO | Source: Ambulatory Visit | Attending: Nephrology | Admitting: Nephrology

## 2015-08-25 DIAGNOSIS — N184 Chronic kidney disease, stage 4 (severe): Secondary | ICD-10-CM | POA: Diagnosis not present

## 2015-08-25 LAB — RENAL FUNCTION PANEL
ALBUMIN: 3.6 g/dL (ref 3.5–5.0)
ANION GAP: 7 (ref 5–15)
BUN: 30 mg/dL — AB (ref 6–20)
CO2: 23 mmol/L (ref 22–32)
Calcium: 9.4 mg/dL (ref 8.9–10.3)
Chloride: 111 mmol/L (ref 101–111)
Creatinine, Ser: 2.95 mg/dL — ABNORMAL HIGH (ref 0.61–1.24)
GFR calc Af Amer: 23 mL/min — ABNORMAL LOW (ref >60)
GFR, EST NON AFRICAN AMERICAN: 20 mL/min — AB (ref >60)
Glucose, Bld: 135 mg/dL — ABNORMAL HIGH (ref 65–99)
PHOSPHORUS: 3.5 mg/dL (ref 2.5–4.6)
POTASSIUM: 3.8 mmol/L (ref 3.5–5.1)
Sodium: 141 mmol/L (ref 135–145)

## 2015-08-25 LAB — IRON AND TIBC
IRON: 57 ug/dL (ref 45–182)
SATURATION RATIOS: 27 % (ref 17.9–39.5)
TIBC: 211 ug/dL — ABNORMAL LOW (ref 250–450)
UIBC: 154 ug/dL

## 2015-08-25 LAB — FERRITIN: Ferritin: 653 ng/mL — ABNORMAL HIGH (ref 24–336)

## 2015-08-25 LAB — POCT HEMOGLOBIN-HEMACUE: Hemoglobin: 11.8 g/dL — ABNORMAL LOW (ref 13.0–17.0)

## 2015-08-25 MED ORDER — EPOETIN ALFA 20000 UNIT/ML IJ SOLN: 20000.0000 [IU] | INTRAMUSCULAR | Status: DC

## 2015-08-26 LAB — PTH, INTACT AND CALCIUM
CALCIUM TOTAL (PTH): 9.3 mg/dL (ref 8.6–10.2)
PTH: 32 pg/mL (ref 15–65)

## 2015-09-03 ENCOUNTER — Encounter: Payer: Self-pay | Admitting: Vascular Surgery

## 2015-09-04 ENCOUNTER — Other Ambulatory Visit: Payer: Self-pay | Admitting: Cardiovascular Disease

## 2015-09-06 NOTE — Telephone Encounter (Signed)
REFILL 

## 2015-09-07 ENCOUNTER — Ambulatory Visit (INDEPENDENT_AMBULATORY_CARE_PROVIDER_SITE_OTHER): Payer: Commercial Managed Care - HMO | Admitting: Vascular Surgery

## 2015-09-07 ENCOUNTER — Encounter (HOSPITAL_COMMUNITY)
Admission: RE | Admit: 2015-09-07 | Discharge: 2015-09-07 | Disposition: A | Discharge status: Home / Self Care | Payer: Commercial Managed Care - HMO | Source: Ambulatory Visit | Attending: Nephrology | Admitting: Nephrology

## 2015-09-07 ENCOUNTER — Encounter: Payer: Self-pay | Admitting: Vascular Surgery

## 2015-09-07 VITALS — BP 124/69 | HR 63 | Temp 98.0°F | Resp 16 | Ht 74.0 in | Wt 200.0 lb

## 2015-09-07 DIAGNOSIS — N184 Chronic kidney disease, stage 4 (severe): Secondary | ICD-10-CM | POA: Insufficient documentation

## 2015-09-07 LAB — POCT HEMOGLOBIN-HEMACUE: HEMOGLOBIN: 11.8 g/dL — AB (ref 13.0–17.0)

## 2015-09-07 MED ORDER — EPOETIN ALFA 20000 UNIT/ML IJ SOLN
INTRAMUSCULAR | Status: AC
Start: 2015-09-07 — End: 2015-09-07
  Filled 2015-09-07: qty 1

## 2015-09-07 MED ORDER — EPOETIN ALFA 20000 UNIT/ML IJ SOLN
20000.0000 [IU] | INTRAMUSCULAR | Status: DC
  Administered 2015-09-07: 20000 [IU] via SUBCUTANEOUS

## 2015-09-07 NOTE — Progress Notes (Signed)
Subjective:     Patient ID: David [name], male   DOB: 1941-08-04, 73 y.o.   MRN: [id number]  HPI  This 74 year old male with chronic kidney disease stage IV had basilic vein transposition created by me on 06/24/2015. Patient is not on hemodialysis. He denies pain or numbness in the left upper extremity.  Review of Systems     Objective:   Physical Exam BP 124/69 mmHg  Pulse 63  Temp(Src) 98 F (36.7 C)  Resp 16  Ht 6\' 2"  (1.88 m)  Wt 200 lb (90.719 kg)  BMI 25.67 kg/m2  SpO2 100%   Gen. Well-developed well-nourished male in no apparent distress alert and oriented 3 Left upper extremity with well-healed incisions in the upper arm. Excellent pulse and thrill in the basilic vein transposition which is easily visible beneath the skin. 2+ radial pulse palpable distally.     Assessment:      patient with stage IV chronic kidney disease with recently created basilic vein transposition left upper arm     Plan:      okay to use basilic vein transposition after October 21  Return to see me on when necessary basis

## 2015-09-08 ENCOUNTER — Encounter (HOSPITAL_COMMUNITY): Payer: Commercial Managed Care - HMO

## 2015-09-25 ENCOUNTER — Other Ambulatory Visit: Payer: Self-pay | Admitting: Infectious Disease

## 2015-09-28 ENCOUNTER — Encounter (HOSPITAL_COMMUNITY)
Admission: RE | Admit: 2015-09-28 | Discharge: 2015-09-28 | Disposition: A | Discharge status: Home / Self Care | Payer: Commercial Managed Care - HMO | Source: Ambulatory Visit | Attending: Nephrology | Admitting: Nephrology

## 2015-09-28 DIAGNOSIS — N184 Chronic kidney disease, stage 4 (severe): Secondary | ICD-10-CM | POA: Diagnosis not present

## 2015-09-28 LAB — POCT HEMOGLOBIN-HEMACUE: HEMOGLOBIN: 12 g/dL — AB (ref 13.0–17.0)

## 2015-09-28 MED ORDER — EPOETIN ALFA 20000 UNIT/ML IJ SOLN: 20000.0000 [IU] | INTRAMUSCULAR | Status: DC

## 2015-10-06 DIAGNOSIS — Z961 Presence of intraocular lens: Secondary | ICD-10-CM | POA: Diagnosis not present

## 2015-10-06 DIAGNOSIS — H401133 Primary open-angle glaucoma, bilateral, severe stage: Secondary | ICD-10-CM | POA: Diagnosis not present

## 2015-10-12 ENCOUNTER — Encounter (HOSPITAL_COMMUNITY)
Admission: RE | Admit: 2015-10-12 | Discharge: 2015-10-12 | Disposition: A | Discharge status: Home / Self Care | Payer: Commercial Managed Care - HMO | Source: Ambulatory Visit | Attending: Nephrology | Admitting: Nephrology

## 2015-10-12 DIAGNOSIS — N184 Chronic kidney disease, stage 4 (severe): Secondary | ICD-10-CM | POA: Insufficient documentation

## 2015-10-12 LAB — RENAL FUNCTION PANEL
ALBUMIN: 3.6 g/dL (ref 3.5–5.0)
Anion gap: 9 (ref 5–15)
BUN: 29 mg/dL — AB (ref 6–20)
CO2: 25 mmol/L (ref 22–32)
CREATININE: 3.06 mg/dL — AB (ref 0.61–1.24)
Calcium: 9.5 mg/dL (ref 8.9–10.3)
Chloride: 106 mmol/L (ref 101–111)
GFR calc Af Amer: 22 mL/min — ABNORMAL LOW (ref >60)
GFR, EST NON AFRICAN AMERICAN: 19 mL/min — AB (ref >60)
GLUCOSE: 150 mg/dL — AB (ref 65–99)
PHOSPHORUS: 3.4 mg/dL (ref 2.5–4.6)
Potassium: 5.2 mmol/L — ABNORMAL HIGH (ref 3.5–5.1)
SODIUM: 140 mmol/L (ref 135–145)

## 2015-10-12 LAB — IRON AND TIBC
IRON: 67 ug/dL (ref 45–182)
Saturation Ratios: 30 % (ref 17.9–39.5)
TIBC: 224 ug/dL — ABNORMAL LOW (ref 250–450)
UIBC: 157 ug/dL

## 2015-10-12 LAB — POCT HEMOGLOBIN-HEMACUE: Hemoglobin: 12.2 g/dL — ABNORMAL LOW (ref 13.0–17.0)

## 2015-10-12 LAB — FERRITIN: Ferritin: 586 ng/mL — ABNORMAL HIGH (ref 24–336)

## 2015-10-12 MED ORDER — EPOETIN ALFA 20000 UNIT/ML IJ SOLN: 20000.0000 [IU] | INTRAMUSCULAR | Status: DC

## 2015-10-15 NOTE — Patient Outreach (Signed)
[facility]) Care Management  10/15/2015  TRAYLIN FILBERT [birth year] [id number]   Referral from [facility] tier 4 list, assigned Dannielle Huh, RN for patient outreach.  [place] Favor L. Consuela Widener, [place] Care Management Assistant

## 2015-10-20 ENCOUNTER — Encounter: Payer: Self-pay | Admitting: *Deleted

## 2015-10-20 ENCOUNTER — Other Ambulatory Visit: Payer: Self-pay | Admitting: *Deleted

## 2015-10-20 DIAGNOSIS — N2581 Secondary hyperparathyroidism of renal origin: Secondary | ICD-10-CM | POA: Diagnosis not present

## 2015-10-20 DIAGNOSIS — N184 Chronic kidney disease, stage 4 (severe): Secondary | ICD-10-CM | POA: Diagnosis not present

## 2015-10-20 NOTE — Patient Outreach (Addendum)
[facility]) Care Management  10/20/2015  David [name] [birth year] [id number]   Assessment: Telephone screen Referral received from [place] [facility] through care management assistant (D. Rhodie) to engage patient with [facility] care management services for diagnosis of congestive heart failure and diabetes.  Call placed and spoke with patient. Care management coordinator introduced self and explained the purpose of the call. Patient declined [facility] services stating "I am in good shape" and "in good health". He also states that he has his wife who is taking care of everything right now (health wise). Per patient, he is at work. Care management coordinator asked patient the best time to call him back and he answered, "I don't want to waste time",  "I don't think I need the services right now.  Encouraged patient to call if he changes his mind or if he has future needs that will arise. Care management coordinator's contact number is with patient. Patient was made aware of eligibility to the program but he refuse to participate.   Plan: Will close case. Will notify primary care provider of case closure.  Mars Scheaffer A. Rynn Markiewicz, BSN, RN-BC [place] Management Coordinator Cell: [phone]

## 2015-10-26 ENCOUNTER — Encounter (HOSPITAL_COMMUNITY)
Admission: RE | Admit: 2015-10-26 | Discharge: 2015-10-26 | Disposition: A | Discharge status: Home / Self Care | Payer: Commercial Managed Care - HMO | Source: Ambulatory Visit | Attending: Nephrology | Admitting: Nephrology

## 2015-10-26 DIAGNOSIS — N184 Chronic kidney disease, stage 4 (severe): Secondary | ICD-10-CM

## 2015-10-26 LAB — POCT HEMOGLOBIN-HEMACUE: HEMOGLOBIN: 11.5 g/dL — AB (ref 13.0–17.0)

## 2015-10-26 MED ORDER — EPOETIN ALFA 20000 UNIT/ML IJ SOLN: 20000.0000 [IU] | INTRAMUSCULAR | Status: DC

## 2015-10-27 DIAGNOSIS — N184 Chronic kidney disease, stage 4 (severe): Secondary | ICD-10-CM | POA: Diagnosis not present

## 2015-10-27 DIAGNOSIS — N2581 Secondary hyperparathyroidism of renal origin: Secondary | ICD-10-CM | POA: Diagnosis not present

## 2015-10-27 DIAGNOSIS — D631 Anemia in chronic kidney disease: Secondary | ICD-10-CM | POA: Diagnosis not present

## 2015-10-27 DIAGNOSIS — I129 Hypertensive chronic kidney disease with stage 1 through stage 4 chronic kidney disease, or unspecified chronic kidney disease: Secondary | ICD-10-CM | POA: Diagnosis not present

## 2015-11-09 ENCOUNTER — Encounter (HOSPITAL_COMMUNITY)
Admission: RE | Admit: 2015-11-09 | Discharge: 2015-11-09 | Disposition: A | Discharge status: Home / Self Care | Payer: Medicare HMO | Source: Ambulatory Visit | Attending: Nephrology | Admitting: Nephrology

## 2015-11-09 DIAGNOSIS — N184 Chronic kidney disease, stage 4 (severe): Secondary | ICD-10-CM | POA: Diagnosis not present

## 2015-11-09 LAB — POCT HEMOGLOBIN-HEMACUE: Hemoglobin: 11.6 g/dL — ABNORMAL LOW (ref 13.0–17.0)

## 2015-11-09 MED ORDER — EPOETIN ALFA 20000 UNIT/ML IJ SOLN: 20000.0000 [IU] | INTRAMUSCULAR | Status: DC

## 2015-11-10 ENCOUNTER — Other Ambulatory Visit (HOSPITAL_COMMUNITY)
Admission: RE | Admit: 2015-11-10 | Discharge: 2015-11-10 | Disposition: A | Discharge status: Home / Self Care | Payer: Commercial Managed Care - HMO | Source: Ambulatory Visit | Attending: Infectious Disease | Admitting: Infectious Disease

## 2015-11-10 ENCOUNTER — Other Ambulatory Visit: Payer: Commercial Managed Care - HMO

## 2015-11-10 DIAGNOSIS — Z79899 Other long term (current) drug therapy: Secondary | ICD-10-CM | POA: Diagnosis not present

## 2015-11-10 DIAGNOSIS — B20 Human immunodeficiency virus [HIV] disease: Secondary | ICD-10-CM

## 2015-11-10 DIAGNOSIS — Z113 Encounter for screening for infections with a predominantly sexual mode of transmission: Secondary | ICD-10-CM | POA: Insufficient documentation

## 2015-11-10 DIAGNOSIS — N184 Chronic kidney disease, stage 4 (severe): Secondary | ICD-10-CM | POA: Diagnosis not present

## 2015-11-10 LAB — CBC WITH DIFFERENTIAL/PLATELET
BASOS PCT: 1 % (ref 0–1)
Basophils Absolute: 0 10*3/uL (ref 0.0–0.1)
EOS ABS: 0.3 10*3/uL (ref 0.0–0.7)
Eosinophils Relative: 8 % — ABNORMAL HIGH (ref 0–5)
HCT: 36.1 % — ABNORMAL LOW (ref 39.0–52.0)
HEMOGLOBIN: 11.9 g/dL — AB (ref 13.0–17.0)
Lymphocytes Relative: 27 % (ref 12–46)
Lymphs Abs: 1.2 10*3/uL (ref 0.7–4.0)
MCH: 33.4 pg (ref 26.0–34.0)
MCHC: 33 g/dL (ref 30.0–36.0)
MCV: 101.4 fL — ABNORMAL HIGH (ref 78.0–100.0)
MONO ABS: 0.4 10*3/uL (ref 0.1–1.0)
MPV: 9.3 fL (ref 8.6–12.4)
Monocytes Relative: 9 % (ref 3–12)
NEUTROS ABS: 2.4 10*3/uL (ref 1.7–7.7)
NEUTROS PCT: 55 % (ref 43–77)
PLATELETS: 206 10*3/uL (ref 150–400)
RBC: 3.56 MIL/uL — AB (ref 4.22–5.81)
RDW: 15.1 % (ref 11.5–15.5)
WBC: 4.3 10*3/uL (ref 4.0–10.5)

## 2015-11-10 LAB — LIPID PANEL
CHOL/HDL RATIO: 3.5 ratio (ref <=5.0)
Cholesterol: 156 mg/dL (ref 125–200)
HDL: 45 mg/dL (ref >=40)
LDL CALC: 95 mg/dL (ref <130)
Triglycerides: 82 mg/dL (ref <150)
VLDL: 16 mg/dL (ref <30)

## 2015-11-10 LAB — COMPLETE METABOLIC PANEL WITH GFR
ALBUMIN: 3.7 g/dL (ref 3.6–5.1)
ALK PHOS: 74 U/L (ref 40–115)
ALT: 13 U/L (ref 9–46)
AST: 16 U/L (ref 10–35)
BUN: 33 mg/dL — AB (ref 7–25)
CHLORIDE: 108 mmol/L (ref 98–110)
CO2: 25 mmol/L (ref 20–31)
Calcium: 9.1 mg/dL (ref 8.6–10.3)
Creat: 2.91 mg/dL — ABNORMAL HIGH (ref 0.70–1.18)
GFR, EST NON AFRICAN AMERICAN: 20 mL/min — AB (ref >=60)
GFR, Est African American: 24 mL/min — ABNORMAL LOW (ref >=60)
GLUCOSE: 112 mg/dL — AB (ref 65–99)
POTASSIUM: 4.4 mmol/L (ref 3.5–5.3)
SODIUM: 144 mmol/L (ref 135–146)
Total Bilirubin: 0.4 mg/dL (ref 0.2–1.2)
Total Protein: 6.9 g/dL (ref 6.1–8.1)

## 2015-11-10 LAB — PTH, INTACT AND CALCIUM
CALCIUM TOTAL (PTH): 9.3 mg/dL (ref 8.6–10.2)
PTH: 46 pg/mL (ref 15–65)

## 2015-11-11 LAB — HIV-1 RNA QUANT-NO REFLEX-BLD
HIV 1 RNA Quant: <20 copies/mL (ref <20)
HIV-1 RNA Quant, Log: <1.3 Log copies/mL (ref <1.30)

## 2015-11-11 LAB — URINE CYTOLOGY ANCILLARY ONLY
Chlamydia: NEGATIVE
NEISSERIA GONORRHEA: NEGATIVE

## 2015-11-11 LAB — RPR TITER

## 2015-11-11 LAB — RPR: RPR Ser Ql: REACTIVE — AB

## 2015-11-11 LAB — T-HELPER CELL (CD4) - (RCID CLINIC ONLY)
CD4 % Helper T Cell: 16 % — ABNORMAL LOW (ref 33–55)
CD4 T Cell Abs: 210 /uL — ABNORMAL LOW (ref 400–2700)

## 2015-11-12 LAB — FLUORESCENT TREPONEMAL AB(FTA)-IGG-BLD: FLUORESCENT TREPONEMAL ABS: REACTIVE — AB

## 2015-11-17 DIAGNOSIS — Z961 Presence of intraocular lens: Secondary | ICD-10-CM | POA: Diagnosis not present

## 2015-11-17 DIAGNOSIS — H401133 Primary open-angle glaucoma, bilateral, severe stage: Secondary | ICD-10-CM | POA: Diagnosis not present

## 2015-11-23 ENCOUNTER — Encounter (HOSPITAL_COMMUNITY)
Admission: RE | Admit: 2015-11-23 | Discharge: 2015-11-23 | Disposition: A | Discharge status: Home / Self Care | Payer: Medicare HMO | Source: Ambulatory Visit | Attending: Nephrology | Admitting: Nephrology

## 2015-11-23 DIAGNOSIS — N184 Chronic kidney disease, stage 4 (severe): Secondary | ICD-10-CM

## 2015-11-23 LAB — RENAL FUNCTION PANEL
ALBUMIN: 3.6 g/dL (ref 3.5–5.0)
Anion gap: 12 (ref 5–15)
BUN: 34 mg/dL — AB (ref 6–20)
CALCIUM: 9.2 mg/dL (ref 8.9–10.3)
CO2: 20 mmol/L — ABNORMAL LOW (ref 22–32)
Chloride: 109 mmol/L (ref 101–111)
Creatinine, Ser: 3.5 mg/dL — ABNORMAL HIGH (ref 0.61–1.24)
GFR calc Af Amer: 18 mL/min — ABNORMAL LOW (ref >60)
GFR calc non Af Amer: 16 mL/min — ABNORMAL LOW (ref >60)
GLUCOSE: 150 mg/dL — AB (ref 65–99)
PHOSPHORUS: 4.5 mg/dL (ref 2.5–4.6)
POTASSIUM: 4.2 mmol/L (ref 3.5–5.1)
SODIUM: 141 mmol/L (ref 135–145)

## 2015-11-23 LAB — IRON AND TIBC
Iron: 55 ug/dL (ref 45–182)
Saturation Ratios: 24 % (ref 17.9–39.5)
TIBC: 231 ug/dL — ABNORMAL LOW (ref 250–450)
UIBC: 176 ug/dL

## 2015-11-23 LAB — POCT HEMOGLOBIN-HEMACUE: Hemoglobin: 11.6 g/dL — ABNORMAL LOW (ref 13.0–17.0)

## 2015-11-23 LAB — FERRITIN: Ferritin: 590 ng/mL — ABNORMAL HIGH (ref 24–336)

## 2015-11-23 MED ORDER — EPOETIN ALFA 20000 UNIT/ML IJ SOLN: 20000.0000 [IU] | INTRAMUSCULAR | Status: DC

## 2015-11-30 NOTE — Progress Notes (Signed)
Patient ID: David [name], male   DOB: January 28, 1941, 74 y.o.   MRN: [id number]    Cardiology Office Note    Date:  12/08/2015   ID:  David [name], DOB 1940-12-31, MRN [id number]  PCP:  David [name]., NP  Cardiologist:   David Klein, MD   Chief Complaint  Patient presents with  . Follow-up    no  chest pain,no  shortness of breath, no edema, no pain in legs, no cramping in legs, no lightheadedness or dizziness    History of Present Illness:  David [name] is a 74 y.o. male Hypertension, nonischemic dilated cardiomyopathy and, chronic kidney disease  Mr. Ratterree has a long-standing history of nonischemic cardiomyopathy (normal coronary angio 2004) with an ejection fraction that was as low as 30% in the past, but on therapy increased to 45-50%. He has symptoms of NYHA class I congestive heart failure at this time.   The patient specifically denies any chest pain at rest or with exertion, dyspnea at rest or with exertion, orthopnea, paroxysmal nocturnal dyspnea, syncope, palpitations, focal neurological deficits, intermittent claudication, lower extremity edema, unexplained weight gain, fever, cough, hemoptysis or wheezing.  His blood pressure has been well controlled on high-dose carvedilol and amlodipine. Not on diuretics or RAAS inhibitors due advanced CKD, now requiring Epo (Dr. Posey Pronto). Has a left arm AV fistula placed in July 2016 (Dr. Kellie Simmering), not yet on dialysis.  During evaluation for unexplained weight loss who was found to have a new diagnosis of HIV/AIDS with a CD4 count of only 60. After initiation of antiretroviral therapyhe gained back weight he had lost  (Dr. Tommy Medal).  Past Medical History  Diagnosis Date  . Hypertension     followed by [facility] and Vascular (Dr Dani Gobble [place])  . Hyperlipidemia   . Cancer [facility])     hx of prostate; s/p radioactive seed implant 10/2009 Dr Janice Norrie  . Systolic and diastolic CHF, acute ([place]) 06/2010    felt to be secondary  to hypertensive cardiomyopathy  . History of cardiac cath 2004    negative for CAD  . History of myocardial perfusion scan 02/2010    negative for coronary insufficiency (LVEF 27%)  . Anemia, iron deficiency 11/15/2011  . Nonischemic cardiomyopathy ([place])   . Sinus bradycardia   . HIV infection ([place])   . Heart murmur     "mild" per pt  . Stroke [facility])     "mini stroke" years ago  . Chronic kidney disease     Past Surgical History  Procedure Laterality Date  . Radioactive seed implant  2010    prostate cancer  . Cardiac catheterization  02/04/2003    mildly depressed LV systolic fx EF [id number] coronaries/abdominal aorta/renal arteries.  . US echocardiography  02/06/2012    mild LVH,LA mod. dilated,mild-mod. 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 AI.  Marland Kitchen Nm myocar perf wall motion  02/21/2010    normal  . Esophagogastroduodenoscopy (egd) with propofol N/A 05/05/2014    Procedure: ESOPHAGOGASTRODUODENOSCOPY (EGD) WITH PROPOFOL;  Surgeon: Missy Sabins, MD;  [facility];  Service: Endoscopy;  Laterality: N/A;  . Eye surgery Bilateral     cataract surgery   . Eye surgery Bilateral     glaucoma surgery  . Colonoscopy    . Bascilic vein transposition Left 06/24/2015    Procedure: BASILIC VEIN TRANSPOSITION;  Surgeon: Mal Misty, MD;  Location: [place];  Service: Vascular;  Laterality: Left;    Current  Outpatient Prescriptions  Medication Sig Dispense Refill  . Abacavir-Dolutegravir-Lamivud (TRIUMEQ) 600-50-300 MG TABS Take 1 tablet by mouth daily. 30 tablet 6  . acetaminophen (TYLENOL) 500 MG tablet Take 500 mg by mouth every 4 (four) hours as needed.     Marland Kitchen amLODipine (NORVASC) 10 MG tablet TAKE 1 TABLET (10 MG TOTAL) BY MOUTH DAILY. 90 tablet 1  . aspirin 325 MG EC tablet Take 325 mg by mouth every evening.     . brimonidine (ALPHAGAN) 0.15 % ophthalmic solution Place 1 drop into the left eye 2 (two) times daily. Instill 1 drop in left eye bid  1  .  carvedilol (COREG) 25 MG tablet Take 1 tablet (25 mg total) by mouth 2 (two) times daily. 180 tablet 3  . Difluprednate (DUREZOL) 0.05 % EMUL Place 1 drop into the left eye every 2 (two) hours.    . dorzolamide-timolol (COSOPT) 22.3-6.8 MG/ML ophthalmic solution Place 1 drop into the left eye 2 (two) times daily. Instil 1 drop in left eye bid    . latanoprost (XALATAN) 0.005 % ophthalmic solution Place 1 drop into the left eye at bedtime. Use 1 drop in left eye at bedtime    . sulfamethoxazole-trimethoprim (BACTRIM DS,SEPTRA DS) 800-160 MG tablet TAKE 1 TABLET BY MOUTH 3 TIMES A WEEK 15 tablet 3  . terazosin (HYTRIN) 10 MG capsule Take 10 mg by mouth at bedtime.     . Vitamin D, Ergocalciferol, (DRISDOL) 50000 UNITS CAPS capsule TK ONE C PO Q  WEEK  2   No current facility-administered medications for this visit.    Allergies:   Codeine and Cozaar   Social History   Social History  . Marital Status: Married    Spouse Name: N/A  . Number of Children: 2  . Years of Education: N/A   Occupational History  . retired    Social History Main Topics  . Smoking status: Former Smoker    Quit date: 12/04/2005  . Smokeless tobacco: Never Used  . Alcohol Use: Yes     Comment: once a week-wine  . Drug Use: No  . Sexual Activity: Yes   Other Topics Concern  . None   Social History Narrative   Stays active at home   Regular exercise: no     Family History:  The patient's family history includes Cholelithiasis in his daughter and son; Diabetes in his maternal grandmother; Hypertension in his maternal grandmother and mother; Thyroid disease in his mother. There is no history of Heart attack or Hyperlipidemia.   ROS:   Please see the history of present illness.    Review of Systems  All other systems reviewed and are negative.    PHYSICAL EXAM:   VS:  BP 124/72 mmHg  Pulse 61  Ht 6\' 3"  (1.905 m)  Wt 205 lb 2 oz (93.044 kg)  BMI 25.64 kg/m2   GEN: Well nourished, well developed, in  no acute distress HEENT: normal Neck: no JVD, carotid bruits, or masses Cardiac: RRR; no murmurs or rubs; +S4 gallop, no edema ; large left arm AV fistula with excellent thrill and bruit Respiratory:  A few crackles in the right base, otherwise clear to auscultation bilaterally, normal work of breathing GI: soft, nontender, nondistended, + BS MS: no deformity or atrophy Skin: warm and dry, no rash Neuro:  Alert and Oriented x 3, Strength and sensation are intact Psych: euthymic mood, full affect  Wt Readings from Last 3 Encounters:  12/08/15 205 lb 2  oz (93.044 kg)  12/07/15 205 lb (92.987 kg)  09/07/15 200 lb (90.719 kg)      Studies/Labs Reviewed:   EKG:  EKG is ordered today.  The ekg ordered today demonstrates normal sinus rhythm with first-degree A-V block (PR 228 ms), left axis deviation, left ventricular hypertrophy, QRS 128 ms, QTC 450 ms.  Recent Labs: 04/06/2015: Magnesium 2.2 11/10/2015: ALT 13; Platelets 206 11/23/2015: BUN 34*; Creatinine, Ser 3.50*; Potassium 4.2; Sodium 141 12/07/2015: Hemoglobin 11.1*   Lipid Panel    Component Value Date/Time   CHOL 156 11/10/2015 1130   TRIG 82 11/10/2015 1130   HDL 45 11/10/2015 1130   CHOLHDL 3.5 11/10/2015 1130   VLDL 16 11/10/2015 1130   LDLCALC 95 11/10/2015 1130     ASSESSMENT:    1. Diastolic heart failure, NYHA class 2 ([place])   2. Nonischemic dilated cardiomyopathy ([place])   3. Essential hypertension   4. CKD (chronic kidney disease) stage 4, GFR 15-29 ml/min (HCC)   5. Hyperlipidemia      PLAN:  In order of problems listed above:  1. Predominantly diastolic heart failure, NYHA class, appears to be euvolemic. Not on ACEi/ARB due to advanced renal disease, approaching ESRD. 2. Most recent EF 2013, 45-50% 3. HTN well controlled - probably primary etiology for his cardiomyopathy 4. Approaching ESRD, already has HD access 5. Satisfactory recent lipid profile, despite protease inhibitors   Medication  Adjustments/Labs and Tests Ordered: Current medicines are reviewed at length with the patient today.  Concerns regarding medicines are outlined above.  Medication changes, Labs and Tests ordered today are listed below. There are no Patient Instructions on file for this visit.    Mikael Spray, MD  12/08/2015 9:33 AM    [facility] [address], [place], [place]  [address] Phone: [phone]; Fax: [phone]

## 2015-12-07 ENCOUNTER — Encounter: Payer: Self-pay | Admitting: Infectious Disease

## 2015-12-07 ENCOUNTER — Encounter (HOSPITAL_COMMUNITY)
Admission: RE | Admit: 2015-12-07 | Discharge: 2015-12-07 | Disposition: A | Discharge status: Home / Self Care | Payer: Medicare HMO | Source: Ambulatory Visit | Attending: Nephrology | Admitting: Nephrology

## 2015-12-07 ENCOUNTER — Ambulatory Visit (INDEPENDENT_AMBULATORY_CARE_PROVIDER_SITE_OTHER): Payer: Commercial Managed Care - HMO | Admitting: Infectious Disease

## 2015-12-07 VITALS — BP 135/77 | HR 61 | Temp 97.4°F | Wt 205.0 lb

## 2015-12-07 DIAGNOSIS — N184 Chronic kidney disease, stage 4 (severe): Secondary | ICD-10-CM | POA: Diagnosis not present

## 2015-12-07 DIAGNOSIS — A529 Late syphilis, unspecified: Secondary | ICD-10-CM

## 2015-12-07 DIAGNOSIS — I10 Essential (primary) hypertension: Secondary | ICD-10-CM

## 2015-12-07 DIAGNOSIS — B20 Human immunodeficiency virus [HIV] disease: Secondary | ICD-10-CM

## 2015-12-07 DIAGNOSIS — I129 Hypertensive chronic kidney disease with stage 1 through stage 4 chronic kidney disease, or unspecified chronic kidney disease: Secondary | ICD-10-CM | POA: Diagnosis not present

## 2015-12-07 LAB — POCT HEMOGLOBIN-HEMACUE: Hemoglobin: 11.1 g/dL — ABNORMAL LOW (ref 13.0–17.0)

## 2015-12-07 MED ORDER — EPOETIN ALFA 20000 UNIT/ML IJ SOLN
20000.0000 [IU] | INTRAMUSCULAR | Status: DC
  Administered 2015-12-07: 20000 [IU] via SUBCUTANEOUS

## 2015-12-07 MED ORDER — EPOETIN ALFA 20000 UNIT/ML IJ SOLN
INTRAMUSCULAR | Status: AC
  Filled 2015-12-07: qty 1

## 2015-12-07 NOTE — Progress Notes (Signed)
Chief complaint: followup for HIV  Subjective:    Patient ID: David [name], male    DOB: Jan 12, 1941, 75 y.o.   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  His RPR came up to 1:16 from 1:1l, now back up to !:4 .  Lab Results  Component Value Date   HIV1RNAQUANT <20 11/10/2015   Lab Results  Component Value Date   CD4TABS 210* 11/10/2015   CD4TABS 170* 05/17/2015   CD4TABS 170* 12/16/2014    Past Medical History  Diagnosis Date  . Hypertension     followed by [facility] and Vascular (Dr Dani Gobble [place])  . Hyperlipidemia   . Cancer [facility])     hx of prostate; s/p radioactive seed implant 10/2009 Dr Janice Norrie  . Systolic and diastolic CHF, acute ([place]) 06/2010    felt to be secondary to hypertensive cardiomyopathy  . History of cardiac cath 2004    negative for CAD  . History of myocardial perfusion scan 02/2010    negative for coronary insufficiency (LVEF 27%)  . Anemia, iron deficiency 11/15/2011  . Nonischemic cardiomyopathy ([place])   . Sinus bradycardia   . HIV infection ([place])   . Heart murmur     "mild" per pt  . Stroke [facility])     "mini stroke" years ago  . Chronic kidney disease     Past Surgical History  Procedure Laterality Date  . Radioactive seed implant  2010    prostate cancer  . Cardiac catheterization  02/04/2003    mildly depressed LV systolic fx EF [id number] coronaries/abdominal aorta/renal arteries.  . US echocardiography  02/06/2012    mild LVH,LA mod. dilated,mild-mod. 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 AI.  Marland Kitchen Nm myocar perf wall motion  02/21/2010    normal  . Esophagogastroduodenoscopy (egd) with propofol N/A 05/05/2014    Procedure: ESOPHAGOGASTRODUODENOSCOPY (EGD) WITH  PROPOFOL;  Surgeon: Missy Sabins, MD;  [facility];  Service: Endoscopy;  Laterality: N/A;  . Eye surgery Bilateral     cataract surgery   . Eye surgery Bilateral     glaucoma surgery  . Colonoscopy    . Bascilic vein transposition Left 06/24/2015    Procedure: BASILIC VEIN TRANSPOSITION;  Surgeon: Mal Misty, MD;  Location: [facility];  Service: Vascular;  Laterality: Left;    Family History  Problem Relation Age of Onset  . Hypertension Mother   . Thyroid disease Mother   . Cholelithiasis Daughter   . Cholelithiasis Son   . Hypertension Maternal Grandmother   . Diabetes Maternal Grandmother   . Heart attack Neg Hx   . Hyperlipidemia Neg Hx       Social History   Social History  . Marital Status: Married    Spouse Name: N/A  . Number of Children: 2  . Years of Education: N/A   Occupational History  . retired    Social History Main Topics  . Smoking status: Former Smoker    Quit date: 12/04/2005  . Smokeless tobacco: Never Used  . Alcohol Use: Yes     Comment: once a week-wine  . Drug Use: No  . Sexual Activity: Yes   Other Topics Concern  . None  Social History Narrative   Stays active at home   Regular exercise: no    Allergies  Allergen Reactions  . Cozaar [Losartan Potassium] Other (See Comments)    constipation     Current outpatient prescriptions:  .  Abacavir-Dolutegravir-Lamivud (TRIUMEQ) 600-50-300 MG TABS, Take 1 tablet by mouth daily., Disp: 30 tablet, Rfl: 6 .  acetaminophen (TYLENOL) 500 MG tablet, Take 500 mg by mouth every 4 (four) hours as needed. , Disp: , Rfl:  .  amLODipine (NORVASC) 10 MG tablet, TAKE 1 TABLET (10 MG TOTAL) BY MOUTH DAILY., Disp: 90 tablet, Rfl: 1 .  aspirin 325 MG EC tablet, Take 325 mg by mouth every evening. , Disp: , Rfl:  .  Besifloxacin HCl (BESIVANCE) 0.6 % SUSP, Place 1 drop into the left eye 2 (two) times daily., Disp: , Rfl:  .  carvedilol (COREG) 25 MG tablet, Take 1 tablet (25 mg total) by mouth  2 (two) times daily., Disp: 180 tablet, Rfl: 3 .  Difluprednate (DUREZOL) 0.05 % EMUL, Place 1 drop into the left eye every 2 (two) hours., Disp: , Rfl:  .  sulfamethoxazole-trimethoprim (BACTRIM DS,SEPTRA DS) 800-160 MG tablet, TAKE 1 TABLET BY MOUTH 3 TIMES A WEEK, Disp: 15 tablet, Rfl: 3 .  terazosin (HYTRIN) 10 MG capsule, Take 10 mg by mouth at bedtime. , Disp: , Rfl:  .  Vitamin D, Ergocalciferol, (DRISDOL) 50000 UNITS CAPS capsule, TK ONE C PO Q  WEEK, Disp: , Rfl: 2 No current facility-administered medications for this visit.  Facility-Administered Medications Ordered in Other Visits:  .  epoetin alfa (EPOGEN,PROCRIT) 91478 UNIT/ML injection, , , ,  .  epoetin alfa (EPOGEN,PROCRIT) injection 20,000 Units, 20,000 Units, Subcutaneous, Q21 days, Elmarie Shiley, MD, 20,000 Units at 12/07/15 0914   Review of Systems  Constitutional: Negative for fever, chills, diaphoresis, activity change, appetite change, fatigue and unexpected weight change.  HENT: Negative for congestion, rhinorrhea, sinus pressure, sneezing, sore throat and trouble swallowing.   Respiratory: Negative for cough, chest tightness and shortness of breath.   Cardiovascular: Negative for chest pain, palpitations and leg swelling.  Gastrointestinal: Negative for nausea, vomiting, abdominal pain, constipation and abdominal distention.  Genitourinary: Negative for hematuria.  Musculoskeletal: Negative for myalgias.  Skin: Negative for color change, pallor, rash and wound.  Neurological: Negative for dizziness, tremors, weakness and light-headedness.  Hematological: Negative for adenopathy. Does not bruise/bleed easily.  Psychiatric/Behavioral: Negative for behavioral problems, confusion, sleep disturbance, decreased concentration and agitation.       Objective:   Physical Exam  Constitutional: He is oriented to person, place, and time. He appears well-developed and well-nourished. No distress.  HENT:  Head: Normocephalic and  atraumatic.  Mouth/Throat: Oropharynx is clear and moist. No oropharyngeal exudate.  Eyes: Conjunctivae and EOM are normal. Pupils are equal, round, and reactive to light. No scleral icterus.  Neck: Normal range of motion. Neck supple. No JVD present.  Cardiovascular: Normal rate, regular rhythm and normal heart sounds.  Exam reveals no gallop and no friction rub.   No murmur heard. Pulmonary/Chest: Effort normal and breath sounds normal. No respiratory distress. He has no wheezes. He has no rales. He exhibits no tenderness.  Abdominal: He exhibits no distension and no mass. There is no tenderness. There is no rebound and no guarding.  Musculoskeletal: He exhibits no edema or tenderness.  Lymphadenopathy:    He has no cervical adenopathy.  Neurological: He is alert and oriented to person, place, and time. He has normal reflexes. He  exhibits normal muscle tone. Coordination normal.  Skin: Skin is warm and dry. He is not diaphoretic. No erythema. No pallor.  Psychiatric: He has a normal mood and affect. His behavior is normal. Judgment and thought content normal.          Assessment & Plan:   HIV/AIDS: continue TRIUMEQ, as per prior discussions, we are giving him higher dose of 3TC than would be typical for this GFR but will continue for now with the STR  Continue bactrim for now at TIW  Syphilis: Titer was 1:168 in 2000 and sp successful treatment,then back  up to 1:16 sp  retreat with IM PCN weekly x 3 weeks, now with RPR of 1:4  CKD: followed by Dr Posey Pronto  HTN: followup with PCP he will need another agent if BP still up beyond what it should be  I spent greater than 25 minutes with the patient including greater than 50% of time in face to face counsel of the patient re his HIV, syphilis, his CKD, his HTN and in coordination of his care.

## 2015-12-08 ENCOUNTER — Ambulatory Visit (INDEPENDENT_AMBULATORY_CARE_PROVIDER_SITE_OTHER): Payer: Commercial Managed Care - HMO | Admitting: Cardiovascular Disease

## 2015-12-08 ENCOUNTER — Encounter: Payer: Self-pay | Admitting: Cardiovascular Disease

## 2015-12-08 VITALS — BP 124/72 | HR 61 | Ht 75.0 in | Wt 205.1 lb

## 2015-12-08 DIAGNOSIS — I429 Cardiomyopathy, unspecified: Secondary | ICD-10-CM | POA: Diagnosis not present

## 2015-12-08 DIAGNOSIS — I5030 Unspecified diastolic (congestive) heart failure: Secondary | ICD-10-CM

## 2015-12-08 DIAGNOSIS — I420 Dilated cardiomyopathy: Secondary | ICD-10-CM

## 2015-12-08 DIAGNOSIS — N184 Chronic kidney disease, stage 4 (severe): Secondary | ICD-10-CM

## 2015-12-08 DIAGNOSIS — E785 Hyperlipidemia, unspecified: Secondary | ICD-10-CM

## 2015-12-08 DIAGNOSIS — I10 Essential (primary) hypertension: Secondary | ICD-10-CM | POA: Diagnosis not present

## 2015-12-08 NOTE — Patient Instructions (Signed)
Your physician recommends that you schedule a follow-up appointment in: [place]

## 2015-12-28 ENCOUNTER — Encounter (HOSPITAL_COMMUNITY)
Admission: RE | Admit: 2015-12-28 | Discharge: 2015-12-28 | Disposition: A | Discharge status: Home / Self Care | Payer: Medicare HMO | Source: Ambulatory Visit | Attending: Nephrology | Admitting: Nephrology

## 2015-12-28 DIAGNOSIS — N184 Chronic kidney disease, stage 4 (severe): Secondary | ICD-10-CM

## 2015-12-28 LAB — RENAL FUNCTION PANEL
Albumin: 3.7 g/dL (ref 3.5–5.0)
Anion gap: 7 (ref 5–15)
BUN: 35 mg/dL — ABNORMAL HIGH (ref 6–20)
CALCIUM: 9.2 mg/dL (ref 8.9–10.3)
CO2: 24 mmol/L (ref 22–32)
CREATININE: 3.36 mg/dL — AB (ref 0.61–1.24)
Chloride: 109 mmol/L (ref 101–111)
GFR calc non Af Amer: 17 mL/min — ABNORMAL LOW (ref >60)
GFR, EST AFRICAN AMERICAN: 19 mL/min — AB (ref >60)
GLUCOSE: 119 mg/dL — AB (ref 65–99)
Phosphorus: 4.8 mg/dL — ABNORMAL HIGH (ref 2.5–4.6)
Potassium: 4.2 mmol/L (ref 3.5–5.1)
SODIUM: 140 mmol/L (ref 135–145)

## 2015-12-28 LAB — IRON AND TIBC
Iron: 61 ug/dL (ref 45–182)
Saturation Ratios: 25 % (ref 17.9–39.5)
TIBC: 241 ug/dL — ABNORMAL LOW (ref 250–450)
UIBC: 180 ug/dL

## 2015-12-28 LAB — FERRITIN: Ferritin: 639 ng/mL — ABNORMAL HIGH (ref 24–336)

## 2015-12-28 MED ORDER — EPOETIN ALFA 20000 UNIT/ML IJ SOLN: 20000.0000 [IU] | INTRAMUSCULAR | Status: DC

## 2015-12-29 DIAGNOSIS — Z961 Presence of intraocular lens: Secondary | ICD-10-CM | POA: Diagnosis not present

## 2015-12-29 DIAGNOSIS — H401133 Primary open-angle glaucoma, bilateral, severe stage: Secondary | ICD-10-CM | POA: Diagnosis not present

## 2015-12-29 LAB — PTH, INTACT AND CALCIUM
CALCIUM TOTAL (PTH): 9.1 mg/dL (ref 8.6–10.2)
PTH: 55 pg/mL (ref 15–65)

## 2016-01-11 ENCOUNTER — Encounter (HOSPITAL_COMMUNITY)
Admission: RE | Admit: 2016-01-11 | Discharge: 2016-01-11 | Disposition: A | Discharge status: Home / Self Care | Payer: Medicare HMO | Source: Ambulatory Visit | Attending: Nephrology | Admitting: Nephrology

## 2016-01-11 DIAGNOSIS — N184 Chronic kidney disease, stage 4 (severe): Secondary | ICD-10-CM | POA: Insufficient documentation

## 2016-01-11 LAB — POCT HEMOGLOBIN-HEMACUE: Hemoglobin: 11.6 g/dL — ABNORMAL LOW (ref 13.0–17.0)

## 2016-01-11 MED ORDER — EPOETIN ALFA 20000 UNIT/ML IJ SOLN: 20000.0000 [IU] | INTRAMUSCULAR | Status: DC

## 2016-01-22 ENCOUNTER — Other Ambulatory Visit: Payer: Self-pay | Admitting: Infectious Disease

## 2016-01-22 DIAGNOSIS — B20 Human immunodeficiency virus [HIV] disease: Secondary | ICD-10-CM

## 2016-01-24 ENCOUNTER — Other Ambulatory Visit (HOSPITAL_COMMUNITY): Payer: Self-pay | Admitting: *Deleted

## 2016-01-24 ENCOUNTER — Telehealth: Payer: Self-pay | Admitting: Family

## 2016-01-24 MED ORDER — OSELTAMIVIR PHOSPHATE 75 MG PO CAPS: 75.0000 mg | ORAL_CAPSULE | Freq: Every day | ORAL | Status: DC

## 2016-01-24 NOTE — Telephone Encounter (Signed)
Notified pt and he voices understanding. 

## 2016-01-24 NOTE — Telephone Encounter (Signed)
Wife has flu and was seen in office today. Advised her that husband should also be treated to prevent flu.   Please notify pt that we have sent tamiflu for him to start since his wife is sick to help prevent flu.

## 2016-01-25 ENCOUNTER — Encounter (HOSPITAL_COMMUNITY)
Admission: RE | Admit: 2016-01-25 | Discharge: 2016-01-25 | Disposition: A | Discharge status: Home / Self Care | Payer: Medicare HMO | Source: Ambulatory Visit | Attending: Nephrology | Admitting: Nephrology

## 2016-01-25 DIAGNOSIS — N184 Chronic kidney disease, stage 4 (severe): Secondary | ICD-10-CM

## 2016-01-25 LAB — POCT HEMOGLOBIN-HEMACUE: HEMOGLOBIN: 12 g/dL — AB (ref 13.0–17.0)

## 2016-01-25 MED ORDER — EPOETIN ALFA 20000 UNIT/ML IJ SOLN: 20000.0000 [IU] | INTRAMUSCULAR | Status: DC

## 2016-01-26 DIAGNOSIS — D631 Anemia in chronic kidney disease: Secondary | ICD-10-CM | POA: Diagnosis not present

## 2016-01-26 DIAGNOSIS — N184 Chronic kidney disease, stage 4 (severe): Secondary | ICD-10-CM | POA: Diagnosis not present

## 2016-01-26 DIAGNOSIS — I129 Hypertensive chronic kidney disease with stage 1 through stage 4 chronic kidney disease, or unspecified chronic kidney disease: Secondary | ICD-10-CM | POA: Diagnosis not present

## 2016-01-26 DIAGNOSIS — N2581 Secondary hyperparathyroidism of renal origin: Secondary | ICD-10-CM | POA: Diagnosis not present

## 2016-02-01 ENCOUNTER — Telehealth: Payer: Self-pay | Admitting: Family

## 2016-02-01 MED ORDER — OSELTAMIVIR PHOSPHATE 30 MG PO CAPS: 30.0000 mg | ORAL_CAPSULE | Freq: Every day | ORAL | Status: DC

## 2016-02-01 NOTE — Telephone Encounter (Signed)
Notified pt and he voices understanding. 

## 2016-02-01 NOTE — Telephone Encounter (Signed)
Please contact patient and let him know that I would like to change his tamiflu dose at the recommendation of his kidney doctor for an additional 2 days then stop.  D/c tamiflu 75, start tamiflu 30mg .

## 2016-02-15 ENCOUNTER — Encounter (HOSPITAL_COMMUNITY)
Admission: RE | Admit: 2016-02-15 | Discharge: 2016-02-15 | Disposition: A | Discharge status: Home / Self Care | Payer: Medicare HMO | Source: Ambulatory Visit | Attending: Nephrology | Admitting: Nephrology

## 2016-02-15 DIAGNOSIS — N184 Chronic kidney disease, stage 4 (severe): Secondary | ICD-10-CM | POA: Insufficient documentation

## 2016-02-15 LAB — RENAL FUNCTION PANEL
Albumin: 3.5 g/dL (ref 3.5–5.0)
Anion gap: 11 (ref 5–15)
BUN: 37 mg/dL — AB (ref 6–20)
CALCIUM: 9.5 mg/dL (ref 8.9–10.3)
CO2: 25 mmol/L (ref 22–32)
CREATININE: 3.14 mg/dL — AB (ref 0.61–1.24)
Chloride: 108 mmol/L (ref 101–111)
GFR, EST AFRICAN AMERICAN: 21 mL/min — AB (ref >60)
GFR, EST NON AFRICAN AMERICAN: 18 mL/min — AB (ref >60)
GLUCOSE: 96 mg/dL (ref 65–99)
Phosphorus: 3.8 mg/dL (ref 2.5–4.6)
Potassium: 4.1 mmol/L (ref 3.5–5.1)
SODIUM: 144 mmol/L (ref 135–145)

## 2016-02-15 LAB — IRON AND TIBC
Iron: 69 ug/dL (ref 45–182)
Saturation Ratios: 30 % (ref 17.9–39.5)
TIBC: 234 ug/dL — ABNORMAL LOW (ref 250–450)
UIBC: 165 ug/dL

## 2016-02-15 LAB — POCT HEMOGLOBIN-HEMACUE: HEMOGLOBIN: 11.7 g/dL — AB (ref 13.0–17.0)

## 2016-02-15 LAB — FERRITIN: FERRITIN: 529 ng/mL — AB (ref 24–336)

## 2016-02-15 MED ORDER — EPOETIN ALFA 20000 UNIT/ML IJ SOLN: 20000.0000 [IU] | INTRAMUSCULAR | Status: DC

## 2016-02-16 ENCOUNTER — Other Ambulatory Visit: Payer: Commercial Managed Care - HMO

## 2016-02-16 ENCOUNTER — Other Ambulatory Visit (HOSPITAL_COMMUNITY)
Admission: RE | Admit: 2016-02-16 | Discharge: 2016-02-16 | Disposition: A | Discharge status: Home / Self Care | Payer: Commercial Managed Care - HMO | Source: Ambulatory Visit | Attending: Infectious Disease | Admitting: Infectious Disease

## 2016-02-16 DIAGNOSIS — B20 Human immunodeficiency virus [HIV] disease: Secondary | ICD-10-CM

## 2016-02-16 DIAGNOSIS — Z113 Encounter for screening for infections with a predominantly sexual mode of transmission: Secondary | ICD-10-CM | POA: Insufficient documentation

## 2016-02-16 LAB — CBC WITH DIFFERENTIAL/PLATELET
Basophils Absolute: 0 10*3/uL (ref 0.0–0.1)
Basophils Relative: 1 % (ref 0–1)
Eosinophils Absolute: 0.4 10*3/uL (ref 0.0–0.7)
Eosinophils Relative: 9 % — ABNORMAL HIGH (ref 0–5)
HEMATOCRIT: 37 % — AB (ref 39.0–52.0)
HEMOGLOBIN: 12.1 g/dL — AB (ref 13.0–17.0)
LYMPHS ABS: 1.2 10*3/uL (ref 0.7–4.0)
Lymphocytes Relative: 28 % (ref 12–46)
MCH: 33.6 pg (ref 26.0–34.0)
MCHC: 32.7 g/dL (ref 30.0–36.0)
MCV: 102.8 fL — AB (ref 78.0–100.0)
MPV: 9.2 fL (ref 8.6–12.4)
Monocytes Absolute: 0.4 10*3/uL (ref 0.1–1.0)
Monocytes Relative: 9 % (ref 3–12)
NEUTROS PCT: 53 % (ref 43–77)
Neutro Abs: 2.2 10*3/uL (ref 1.7–7.7)
Platelets: 208 10*3/uL (ref 150–400)
RBC: 3.6 MIL/uL — ABNORMAL LOW (ref 4.22–5.81)
RDW: 15.2 % (ref 11.5–15.5)
WBC: 4.2 10*3/uL (ref 4.0–10.5)

## 2016-02-16 LAB — COMPLETE METABOLIC PANEL WITH GFR
ALT: 18 U/L (ref 9–46)
AST: 18 U/L (ref 10–35)
Albumin: 3.8 g/dL (ref 3.6–5.1)
Alkaline Phosphatase: 77 U/L (ref 40–115)
BILIRUBIN TOTAL: 0.4 mg/dL (ref 0.2–1.2)
BUN: 34 mg/dL — ABNORMAL HIGH (ref 7–25)
CO2: 25 mmol/L (ref 20–31)
Calcium: 8.7 mg/dL (ref 8.6–10.3)
Chloride: 105 mmol/L (ref 98–110)
Creat: 3.12 mg/dL — ABNORMAL HIGH (ref 0.70–1.18)
GFR, EST AFRICAN AMERICAN: 22 mL/min — AB (ref >=60)
GFR, EST NON AFRICAN AMERICAN: 19 mL/min — AB (ref >=60)
Glucose, Bld: 93 mg/dL (ref 65–99)
Potassium: 4.3 mmol/L (ref 3.5–5.3)
Sodium: 142 mmol/L (ref 135–146)
TOTAL PROTEIN: 7 g/dL (ref 6.1–8.1)

## 2016-02-16 LAB — PTH, INTACT AND CALCIUM
CALCIUM TOTAL (PTH): 9 mg/dL (ref 8.6–10.2)
PTH: 54 pg/mL (ref 15–65)

## 2016-02-16 NOTE — Addendum Note (Signed)
Addended by: Dolan Amen D on: 02/16/2016 10:54 AM   Modules accepted: Orders

## 2016-02-17 DIAGNOSIS — B20 Human immunodeficiency virus [HIV] disease: Secondary | ICD-10-CM | POA: Diagnosis not present

## 2016-02-17 LAB — T-HELPER CELL (CD4) - (RCID CLINIC ONLY)
CD4 % Helper T Cell: 17 % — ABNORMAL LOW (ref 33–55)
CD4 T Cell Abs: 200 /uL — ABNORMAL LOW (ref 400–2700)

## 2016-02-17 LAB — HIV-1 RNA QUANT-NO REFLEX-BLD: HIV 1 RNA Quant: <20 copies/mL (ref <20)

## 2016-02-17 LAB — URINE CYTOLOGY ANCILLARY ONLY
CHLAMYDIA, DNA PROBE: NEGATIVE
NEISSERIA GONORRHEA: NEGATIVE

## 2016-02-17 LAB — RPR: RPR Ser Ql: REACTIVE — AB

## 2016-02-17 LAB — RPR TITER

## 2016-02-18 LAB — FLUORESCENT TREPONEMAL AB(FTA)-IGG-BLD: FLUORESCENT TREPONEMAL ABS: REACTIVE — AB

## 2016-02-26 ENCOUNTER — Other Ambulatory Visit: Payer: Self-pay | Admitting: Cardiovascular Disease

## 2016-02-28 ENCOUNTER — Telehealth: Payer: Self-pay

## 2016-02-28 ENCOUNTER — Other Ambulatory Visit: Payer: Self-pay

## 2016-02-28 MED ORDER — AMLODIPINE BESYLATE 10 MG PO TABS: ORAL_TABLET | ORAL | Status: DC

## 2016-02-28 MED ORDER — CARVEDILOL 25 MG PO TABS: 25.0000 mg | ORAL_TABLET | Freq: Two times a day (BID) | ORAL | Status: DC

## 2016-02-28 NOTE — Telephone Encounter (Signed)
Patient walked in office requesting refills for coreg and amlodipine.Refills sent to mail order pharmacy.

## 2016-02-29 ENCOUNTER — Encounter (HOSPITAL_COMMUNITY)
Admission: RE | Admit: 2016-02-29 | Discharge: 2016-02-29 | Disposition: A | Discharge status: Home / Self Care | Payer: Medicare HMO | Source: Ambulatory Visit | Attending: Nephrology | Admitting: Nephrology

## 2016-02-29 DIAGNOSIS — N184 Chronic kidney disease, stage 4 (severe): Secondary | ICD-10-CM | POA: Diagnosis not present

## 2016-02-29 LAB — POCT HEMOGLOBIN-HEMACUE: HEMOGLOBIN: 11.9 g/dL — AB (ref 13.0–17.0)

## 2016-02-29 MED ORDER — EPOETIN ALFA 20000 UNIT/ML IJ SOLN: 20000.0000 [IU] | INTRAMUSCULAR | Status: DC

## 2016-03-01 ENCOUNTER — Encounter: Payer: Self-pay | Admitting: Infectious Disease

## 2016-03-01 ENCOUNTER — Ambulatory Visit (INDEPENDENT_AMBULATORY_CARE_PROVIDER_SITE_OTHER): Payer: Commercial Managed Care - HMO | Admitting: Infectious Disease

## 2016-03-01 VITALS — BP 136/73 | HR 62 | Temp 97.5°F | Ht 74.0 in | Wt 212.0 lb

## 2016-03-01 DIAGNOSIS — I10 Essential (primary) hypertension: Secondary | ICD-10-CM | POA: Diagnosis not present

## 2016-03-01 DIAGNOSIS — N184 Chronic kidney disease, stage 4 (severe): Secondary | ICD-10-CM | POA: Diagnosis not present

## 2016-03-01 DIAGNOSIS — B20 Human immunodeficiency virus [HIV] disease: Secondary | ICD-10-CM

## 2016-03-01 DIAGNOSIS — A529 Late syphilis, unspecified: Secondary | ICD-10-CM

## 2016-03-01 NOTE — Progress Notes (Signed)
Chief complaint: followup for HIV  Subjective:    Patient ID: David [name], male    DOB: 07-Jun-1941, 75 y.o.   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  His RPR came up from 1:1  to 1:16 then to 1:4 and now up to 1:8. He maintains he has no new sexual partners at all. I will recheck his RPR in next 6 months as he could be hovering around sero-fast state   .  Lab Results  Component Value Date   HIV1RNAQUANT <20 02/16/2016   Lab Results  Component Value Date   CD4TABS 200* 02/15/2016   CD4TABS 210* 11/10/2015   CD4TABS 170* 05/17/2015    Past Medical History  Diagnosis Date  . Hypertension     followed by [facility] (Dr Dani Gobble Croitoru)  . Hyperlipidemia   . Cancer [facility])     hx of prostate; s/p radioactive seed implant 10/2009 Dr Janice Norrie  . Systolic and diastolic CHF, acute ([place]) 06/2010    felt to be secondary to hypertensive cardiomyopathy  . History of cardiac cath 2004    negative for CAD  . History of myocardial perfusion scan 02/2010    negative for coronary insufficiency (LVEF 27%)  . Anemia, iron deficiency 11/15/2011  . Nonischemic cardiomyopathy ([place])   . Sinus bradycardia   . HIV infection ([place])   . Heart murmur     "mild" per pt  . Stroke [name])     "mini stroke" years ago  . Chronic kidney disease     Past Surgical History  Procedure Laterality Date  . Radioactive seed implant  2010    prostate cancer  . Cardiac catheterization  02/04/2003    mildly depressed LV systolic fx EF [id number] coronaries/abdominal aorta/renal arteries.  . US echocardiography  02/06/2012    mild LVH,LA mod. dilated,mild-mod. 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 AI.  Marland Kitchen Nm myocar perf wall  motion  02/21/2010    normal  . Esophagogastroduodenoscopy (egd) with propofol N/A 05/05/2014    Procedure: ESOPHAGOGASTRODUODENOSCOPY (EGD) WITH PROPOFOL;  Surgeon: Missy Sabins, MD;  [facility];  Service: Endoscopy;  Laterality: N/A;  . Eye surgery Bilateral     cataract surgery   . Eye surgery Bilateral     glaucoma surgery  . Colonoscopy    . Bascilic vein transposition Left 06/24/2015    Procedure: BASILIC VEIN TRANSPOSITION;  Surgeon: Mal Misty, MD;  Location: [facility] OR;  Service: Vascular;  Laterality: Left;    Family History  Problem Relation Age of Onset  . Hypertension Mother   . Thyroid disease Mother   . Cholelithiasis Daughter   . Cholelithiasis Son   . Hypertension Maternal Grandmother   . Diabetes Maternal Grandmother   . Heart attack Neg Hx   . Hyperlipidemia Neg Hx       Social History   Social History  . Marital Status: Married    Spouse Name: N/A  . Number of Children: 2  . Years of Education: N/A   Occupational History  . retired    Social History Main Topics  . Smoking status: Former Smoker    Quit date: 12/04/2005  . Smokeless tobacco:  Never Used  . Alcohol Use: Yes     Comment: once a week-wine  . Drug Use: No  . Sexual Activity: Yes   Other Topics Concern  . None   Social History Narrative   Stays active at home   Regular exercise: no    Allergies  Allergen Reactions  . Codeine Other (See Comments)    Causes constipation.   Blanchard Kelch [Losartan Potassium] Other (See Comments)    constipation     Current outpatient prescriptions:  .  Abacavir-Dolutegravir-Lamivud (TRIUMEQ) 600-50-300 MG TABS, Take 1 tablet by mouth daily., Disp: 30 tablet, Rfl: 6 .  acetaminophen (TYLENOL) 500 MG tablet, Take 500 mg by mouth every 4 (four) hours as needed. , Disp: , Rfl:  .  amLODipine (NORVASC) 10 MG tablet, TAKE 1 TABLET (10 MG TOTAL) BY MOUTH DAILY., Disp: 90 tablet, Rfl: 3 .  aspirin 325 MG EC tablet, Take 325 mg by mouth every  evening. , Disp: , Rfl:  .  brimonidine (ALPHAGAN) 0.15 % ophthalmic solution, Place 1 drop into the left eye 2 (two) times daily. Instill 1 drop in left eye bid, Disp: , Rfl: 1 .  carvedilol (COREG) 25 MG tablet, Take 1 tablet (25 mg total) by mouth 2 (two) times daily., Disp: 180 tablet, Rfl: 3 .  Difluprednate (DUREZOL) 0.05 % EMUL, Place 1 drop into the left eye every 2 (two) hours., Disp: , Rfl:  .  dorzolamide-timolol (COSOPT) 22.3-6.8 MG/ML ophthalmic solution, Place 1 drop into the left eye 2 (two) times daily. Instil 1 drop in left eye bid, Disp: , Rfl:  .  latanoprost (XALATAN) 0.005 % ophthalmic solution, Place 1 drop into the left eye at bedtime. Use 1 drop in left eye at bedtime, Disp: , Rfl:  .  terazosin (HYTRIN) 10 MG capsule, Take 10 mg by mouth at bedtime. , Disp: , Rfl:  .  Vitamin D, Ergocalciferol, (DRISDOL) 50000 UNITS CAPS capsule, TK ONE C PO Q  WEEK, Disp: , Rfl: 2   Review of Systems  Constitutional: Negative for fever, chills, diaphoresis, activity change, appetite change, fatigue and unexpected weight change.  HENT: Negative for congestion, rhinorrhea, sinus pressure, sneezing, sore throat and trouble swallowing.   Respiratory: Negative for cough, chest tightness and shortness of breath.   Cardiovascular: Negative for chest pain, palpitations and leg swelling.  Gastrointestinal: Negative for nausea, vomiting, abdominal pain, constipation and abdominal distention.  Genitourinary: Negative for hematuria.  Musculoskeletal: Negative for myalgias.  Skin: Negative for color change, pallor, rash and wound.  Neurological: Negative for dizziness, tremors, weakness and light-headedness.  Hematological: Negative for adenopathy. Does not bruise/bleed easily.  Psychiatric/Behavioral: Negative for behavioral problems, confusion, sleep disturbance, decreased concentration and agitation.       Objective:   Physical Exam  Constitutional: He is oriented to person, place, and  time. He appears well-developed and well-nourished. No distress.  HENT:  Head: Normocephalic and atraumatic.  Mouth/Throat: Oropharynx is clear and moist. No oropharyngeal exudate.  Eyes: Conjunctivae and EOM are normal. Pupils are equal, round, and reactive to light. No scleral icterus.  Neck: Normal range of motion. Neck supple. No JVD present.  Cardiovascular: Normal rate, regular rhythm and normal heart sounds.  Exam reveals no gallop and no friction rub.   No murmur heard. Pulmonary/Chest: Effort normal and breath sounds normal. No respiratory distress. He has no wheezes. He has no rales. He exhibits no tenderness.  Abdominal: He exhibits no distension and no mass. There is no tenderness.  There is no rebound and no guarding.  Musculoskeletal: He exhibits no edema or tenderness.  Lymphadenopathy:    He has no cervical adenopathy.  Neurological: He is alert and oriented to person, place, and time. He has normal reflexes. He exhibits normal muscle tone. Coordination normal.  Skin: Skin is warm and dry. He is not diaphoretic. No erythema. No pallor.  Psychiatric: He has a normal mood and affect. His behavior is normal. Judgment and thought content normal.          Assessment & Plan:   HIV/AIDS: continue TRIUMEQ, and recheck labs in July and renew SPAP DC bactrim  Syphilis: Titer was 1:168 in 2000 and sp successful treatment,then back  up to 1:16 sp  retreat with IM PCN weekly x 3 weeks, --> RPR of 1:4, now --> 1:8. Will recheck in 6 months  CKD: followed by Dr Posey Pronto  HTN: followup with PCP.  Filed Vitals:   03/01/16 0929  BP: 136/73  Pulse: 62  Temp: 97.5 F (36.4 C)     I spent greater than 25 minutes with the patient including greater than 50% of time in face to face counsel of the patient re his HIV, syphilis, his CKD, his HTN and in coordination of his care.

## 2016-03-14 ENCOUNTER — Encounter (HOSPITAL_COMMUNITY)
Admission: RE | Admit: 2016-03-14 | Discharge: 2016-03-14 | Disposition: A | Discharge status: Home / Self Care | Payer: Medicare HMO | Source: Ambulatory Visit | Attending: Nephrology | Admitting: Nephrology

## 2016-03-14 DIAGNOSIS — N184 Chronic kidney disease, stage 4 (severe): Secondary | ICD-10-CM | POA: Diagnosis not present

## 2016-03-14 LAB — RENAL FUNCTION PANEL
ALBUMIN: 3.6 g/dL (ref 3.5–5.0)
ANION GAP: 10 (ref 5–15)
BUN: 34 mg/dL — ABNORMAL HIGH (ref 6–20)
CALCIUM: 8.9 mg/dL (ref 8.9–10.3)
CO2: 23 mmol/L (ref 22–32)
Chloride: 110 mmol/L (ref 101–111)
Creatinine, Ser: 3.47 mg/dL — ABNORMAL HIGH (ref 0.61–1.24)
GFR, EST AFRICAN AMERICAN: 19 mL/min — AB (ref >60)
GFR, EST NON AFRICAN AMERICAN: 16 mL/min — AB (ref >60)
GLUCOSE: 167 mg/dL — AB (ref 65–99)
PHOSPHORUS: 4 mg/dL (ref 2.5–4.6)
POTASSIUM: 4.4 mmol/L (ref 3.5–5.1)
SODIUM: 143 mmol/L (ref 135–145)

## 2016-03-14 LAB — IRON AND TIBC
IRON: 56 ug/dL (ref 45–182)
SATURATION RATIOS: 23 % (ref 17.9–39.5)
TIBC: 239 ug/dL — AB (ref 250–450)
UIBC: 183 ug/dL

## 2016-03-14 LAB — POCT HEMOGLOBIN-HEMACUE: Hemoglobin: 11.6 g/dL — ABNORMAL LOW (ref 13.0–17.0)

## 2016-03-14 LAB — FERRITIN: Ferritin: 544 ng/mL — ABNORMAL HIGH (ref 24–336)

## 2016-03-14 MED ORDER — EPOETIN ALFA 20000 UNIT/ML IJ SOLN: 20000.0000 [IU] | INTRAMUSCULAR | Status: DC

## 2016-03-15 LAB — PTH, INTACT AND CALCIUM
Calcium, Total (PTH): 8.6 mg/dL (ref 8.6–10.2)
PTH: 74 pg/mL — AB (ref 15–65)

## 2016-03-27 ENCOUNTER — Other Ambulatory Visit (HOSPITAL_COMMUNITY): Payer: Self-pay | Admitting: *Deleted

## 2016-03-28 ENCOUNTER — Encounter (HOSPITAL_COMMUNITY): Payer: Commercial Managed Care - HMO

## 2016-03-29 DIAGNOSIS — Z961 Presence of intraocular lens: Secondary | ICD-10-CM | POA: Diagnosis not present

## 2016-03-29 DIAGNOSIS — H401133 Primary open-angle glaucoma, bilateral, severe stage: Secondary | ICD-10-CM | POA: Diagnosis not present

## 2016-04-04 ENCOUNTER — Encounter: Payer: Self-pay | Admitting: Internal Medicine

## 2016-04-12 DIAGNOSIS — D631 Anemia in chronic kidney disease: Secondary | ICD-10-CM | POA: Diagnosis not present

## 2016-04-12 DIAGNOSIS — I129 Hypertensive chronic kidney disease with stage 1 through stage 4 chronic kidney disease, or unspecified chronic kidney disease: Secondary | ICD-10-CM | POA: Diagnosis not present

## 2016-04-12 DIAGNOSIS — N184 Chronic kidney disease, stage 4 (severe): Secondary | ICD-10-CM | POA: Diagnosis not present

## 2016-04-12 DIAGNOSIS — N2581 Secondary hyperparathyroidism of renal origin: Secondary | ICD-10-CM | POA: Diagnosis not present

## 2016-04-26 ENCOUNTER — Emergency Department (HOSPITAL_BASED_OUTPATIENT_CLINIC_OR_DEPARTMENT_OTHER)
Admission: EM | Admit: 2016-04-26 | Discharge: 2016-04-26 | Disposition: A | Discharge status: Home / Self Care | Payer: Commercial Managed Care - HMO | Attending: Emergency Medicine | Admitting: Emergency Medicine

## 2016-04-26 ENCOUNTER — Encounter (HOSPITAL_BASED_OUTPATIENT_CLINIC_OR_DEPARTMENT_OTHER): Payer: Self-pay

## 2016-04-26 ENCOUNTER — Emergency Department (HOSPITAL_BASED_OUTPATIENT_CLINIC_OR_DEPARTMENT_OTHER): Payer: Commercial Managed Care - HMO

## 2016-04-26 DIAGNOSIS — I5041 Acute combined systolic (congestive) and diastolic (congestive) heart failure: Secondary | ICD-10-CM | POA: Diagnosis not present

## 2016-04-26 DIAGNOSIS — Z87891 Personal history of nicotine dependence: Secondary | ICD-10-CM | POA: Insufficient documentation

## 2016-04-26 DIAGNOSIS — Z8673 Personal history of transient ischemic attack (TIA), and cerebral infarction without residual deficits: Secondary | ICD-10-CM | POA: Diagnosis not present

## 2016-04-26 DIAGNOSIS — I130 Hypertensive heart and chronic kidney disease with heart failure and stage 1 through stage 4 chronic kidney disease, or unspecified chronic kidney disease: Secondary | ICD-10-CM | POA: Insufficient documentation

## 2016-04-26 DIAGNOSIS — Y929 Unspecified place or not applicable: Secondary | ICD-10-CM | POA: Insufficient documentation

## 2016-04-26 DIAGNOSIS — Y93F2 Activity, caregiving, lifting: Secondary | ICD-10-CM | POA: Insufficient documentation

## 2016-04-26 DIAGNOSIS — Y999 Unspecified external cause status: Secondary | ICD-10-CM | POA: Insufficient documentation

## 2016-04-26 DIAGNOSIS — Z79899 Other long term (current) drug therapy: Secondary | ICD-10-CM | POA: Diagnosis not present

## 2016-04-26 DIAGNOSIS — S61215A Laceration without foreign body of left ring finger without damage to nail, initial encounter: Secondary | ICD-10-CM | POA: Insufficient documentation

## 2016-04-26 DIAGNOSIS — W268XXA Contact with other sharp object(s), not elsewhere classified, initial encounter: Secondary | ICD-10-CM | POA: Diagnosis not present

## 2016-04-26 DIAGNOSIS — E785 Hyperlipidemia, unspecified: Secondary | ICD-10-CM | POA: Diagnosis not present

## 2016-04-26 DIAGNOSIS — Z7982 Long term (current) use of aspirin: Secondary | ICD-10-CM | POA: Diagnosis not present

## 2016-04-26 DIAGNOSIS — N189 Chronic kidney disease, unspecified: Secondary | ICD-10-CM | POA: Diagnosis not present

## 2016-04-26 DIAGNOSIS — IMO0002 Reserved for concepts with insufficient information to code with codable children: Secondary | ICD-10-CM

## 2016-04-26 MED ORDER — LIDOCAINE HCL 2 % IJ SOLN
20.0000 mL | Freq: Once | INTRAMUSCULAR | Status: DC
  Filled 2016-04-26: qty 20

## 2016-04-26 NOTE — ED Notes (Signed)
Compressor fell on left ring finger at home approx 2pm-bandaid in place upon arrival-NAD-steady gait

## 2016-04-26 NOTE — ED Provider Notes (Signed)
CSN: [id number]     Arrival date & time 04/26/16  1545 History   First MD Initiated Contact with Patient 04/26/16 1610     Chief Complaint  Patient presents with  . Finger Injury   HPI   2 YOM presents today with laceration to Left ring finger. Patient reports that he was lifting a air-conditioning unit when it slid down causing laceration to his left ring finger. Patient denies any pain other than the obvious laceration, located on the palmar aspect of the finger. Bleeding controlled. Tetanus up-to-date within last 2-3 years per patient. No decreased range of motion of the digit, no tenderness to palpation of the dorsal aspect of the finger. No other injuries noted.   Past Medical History  Diagnosis Date  . Hypertension     followed by [facility] V[facility] (Dr Dani Gobble Croitoru)  . Hyperlipidemia   . Cancer [facility])     hx of prostate; s/p radioactive seed implant 10/2009 Dr Janice Norrie  . Systolic and diastolic CHF, acute ([place]) 06/2010    felt to be secondary to hypertensive cardiomyopathy  . History of cardiac cath 2004    negative for CAD  . History of myocardial perfusion scan 02/2010    negative for coronary insufficiency (LVEF 27%)  . Anemia, iron deficiency 11/15/2011  . Nonischemic cardiomyopathy ([place])   . Sinus bradycardia   . HIV infection ([place])   . Heart murmur     "mild" per pt  . Stroke [facility])     "mini stroke" years ago  . Chronic kidney disease    Past Surgical History  Procedure Laterality Date  . Radioactive seed implant  2010    prostate cancer  . Cardiac catheterization  02/04/2003    mildly depressed LV systolic fx EF [id number] coronaries/abdominal aorta/renal arteries.  . US echocardiography  02/06/2012    mild LVH,LA mod. dilated,mild-mod. 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 AI.  Marland Kitchen Nm myocar perf wall motion  02/21/2010    normal  . Esophagogastroduodenoscopy (egd) with propofol N/A 05/05/2014    Procedure:  ESOPHAGOGASTRODUODENOSCOPY (EGD) WITH PROPOFOL;  Surgeon: Missy Sabins, MD;  [facility];  Service: Endoscopy;  Laterality: N/A;  . Eye surgery Bilateral     cataract surgery   . Eye surgery Bilateral     glaucoma surgery  . Colonoscopy    . Bascilic vein transposition Left 06/24/2015    Procedure: BASILIC VEIN TRANSPOSITION;  Surgeon: Mal Misty, MD;  Location: [facility] OR;  Service: Vascular;  Laterality: Left;   Family History  Problem Relation Age of Onset  . Hypertension Mother   . Thyroid disease Mother   . Cholelithiasis Daughter   . Cholelithiasis Son   . Hypertension Maternal Grandmother   . Diabetes Maternal Grandmother   . Heart attack Neg Hx   . Hyperlipidemia Neg Hx    Social History  Substance Use Topics  . Smoking status: Former Smoker    Quit date: 12/04/2005  . Smokeless tobacco: Never Used  . Alcohol Use: Yes     Comment: once a week-wine    Review of Systems  All other systems reviewed and are negative.  Allergies  Codeine and Cozaar  Home Medications   Prior to Admission medications   Medication Sig Start Date End Date Taking? Authorizing Provider  Abacavir-Dolutegravir-Lamivud (TRIUMEQ) 600-50-300 MG TABS Take 1 tablet by mouth daily. 06/30/15   Thayer Headings, MD  acetaminophen (TYLENOL) 500 MG tablet Take 500  mg by mouth every 4 (four) hours as needed.     Historical Provider, MD  amLODipine (NORVASC) 10 MG tablet TAKE 1 TABLET (10 MG TOTAL) BY MOUTH DAILY. 02/28/16   Mihai Croitoru, MD  aspirin 325 MG EC tablet Take 325 mg by mouth every evening.     Historical Provider, MD  brimonidine (ALPHAGAN) 0.15 % ophthalmic solution Place 1 drop into the left eye 2 (two) times daily. Instill 1 drop in left eye bid 11/18/15   Historical Provider, MD  carvedilol (COREG) 25 MG tablet Take 1 tablet (25 mg total) by mouth 2 (two) times daily. 02/28/16   Mihai Croitoru, MD  Difluprednate (DUREZOL) 0.05 % EMUL Place 1 drop into the left eye every 2 (two)  hours.    Historical Provider, MD  dorzolamide-timolol (COSOPT) 22.3-6.8 MG/ML ophthalmic solution Place 1 drop into the left eye 2 (two) times daily. Instil 1 drop in left eye bid    Historical Provider, MD  latanoprost (XALATAN) 0.005 % ophthalmic solution Place 1 drop into the left eye at bedtime. Use 1 drop in left eye at bedtime    Historical Provider, MD  terazosin (HYTRIN) 10 MG capsule Take 10 mg by mouth at bedtime.     Historical Provider, MD  Vitamin D, Ergocalciferol, (DRISDOL) 50000 UNITS CAPS capsule TK ONE C PO Q  WEEK 04/10/15   Historical Provider, MD   BP 135/79 mmHg  Pulse 65  Temp(Src) 98.8 F (37.1 C) (Oral)  Resp 18  Ht 6\' 2"  (1.88 m)  Wt 96.163 kg  BMI 27.21 kg/m2  SpO2 100%   Physical Exam  Constitutional: He is oriented to person, place, and time. He appears well-developed and well-nourished.  HENT:  Head: Normocephalic and atraumatic.  Eyes: Conjunctivae are normal. Pupils are equal, round, and reactive to light. Right eye exhibits no discharge. Left eye exhibits no discharge. No scleral icterus.  Neck: Normal range of motion. No JVD present. No tracheal deviation present.  Pulmonary/Chest: Effort normal. No stridor.  Musculoskeletal:  1.5 cm laceration to palmer distal aspect of left ring. Bleeding controlled, no deep space involvement. No pain to palpation of distal aspect, no pain with loading phalanx. Full flexion/ extension of digit  Neurological: He is alert and oriented to person, place, and time. Coordination normal.  Psychiatric: He has a normal mood and affect. His behavior is normal. Judgment and thought content normal.  Nursing note and vitals reviewed.   ED Course  Procedures (including critical care time)  LACERATION REPAIR Performed by: Elmer Ramp Authorized by: Elmer Ramp Consent: Verbal consent obtained. Risks and benefits: risks, benefits and alternatives were discussed Consent given by: patient Patient identity  confirmed: provided demographic data Prepped and Draped in normal sterile fashion Wound explored and copiously irrigated  Laceration Location: Left ring finger palmar aspect  Laceration Length: 1.5 cm  No Foreign Bodies seen or palpated  Anesthesia: local infiltration  Local anesthetic: lidocaine 2% % 0 epinephrine  Anesthetic total: 4 ml  Irrigation method: syringe Amount of cleaning: standard  Skin closure: Simple   Number of sutures: 9   Technique: Simple interrupted   Patient tolerance: Patient tolerated the procedure well with no immediate complications. Labs Review Labs Reviewed - No data to display  Imaging Review No results found. I have personally reviewed and evaluated these images and lab results as part of my medical decision-making.   EKG Interpretation None      MDM   Final diagnoses:  Laceration  Labs:   Imaging:  Consults:  Therapeutics: lidocaine 2%  Discharge Meds:   Assessment/Plan: Pt presents with simple laceration to finger. No deep space involvement, patient refused x-ray for fracture evaluation, low suspicion for fracture, no pain with loading of the bone, no surrounding tenderness to palpation or significant swelling. Wound is repaired without complication, patient instructed follow up with primary care in 7 days for suture removal, return if signs of infection present. Patient verbalized understanding and agreement today's plan. Patient's tetanus is up-to-date.        Okey Regal, PA-C 04/26/16 [place], MD 04/26/16 [phone]

## 2016-06-12 ENCOUNTER — Other Ambulatory Visit (HOSPITAL_COMMUNITY)
Admission: RE | Admit: 2016-06-12 | Discharge: 2016-06-12 | Disposition: A | Discharge status: Home / Self Care | Payer: Commercial Managed Care - HMO | Source: Ambulatory Visit | Attending: Infectious Disease | Admitting: Infectious Disease

## 2016-06-12 ENCOUNTER — Other Ambulatory Visit: Payer: Commercial Managed Care - HMO

## 2016-06-12 ENCOUNTER — Ambulatory Visit: Payer: Commercial Managed Care - HMO

## 2016-06-12 DIAGNOSIS — D126 Benign neoplasm of colon, unspecified: Secondary | ICD-10-CM | POA: Diagnosis not present

## 2016-06-12 DIAGNOSIS — B20 Human immunodeficiency virus [HIV] disease: Secondary | ICD-10-CM | POA: Diagnosis not present

## 2016-06-12 LAB — COMPLETE METABOLIC PANEL WITH GFR
ALBUMIN: 3.9 g/dL (ref 3.6–5.1)
ALT: 15 U/L (ref 9–46)
AST: 15 U/L (ref 10–35)
Alkaline Phosphatase: 89 U/L (ref 40–115)
BILIRUBIN TOTAL: 0.4 mg/dL (ref 0.2–1.2)
BUN: 33 mg/dL — AB (ref 7–25)
CALCIUM: 8.9 mg/dL (ref 8.6–10.3)
CHLORIDE: 105 mmol/L (ref 98–110)
CO2: 26 mmol/L (ref 20–31)
CREATININE: 3.27 mg/dL — AB (ref 0.70–1.18)
GFR, Est African American: 20 mL/min — ABNORMAL LOW (ref >=60)
GFR, Est Non African American: 18 mL/min — ABNORMAL LOW (ref >=60)
Glucose, Bld: 103 mg/dL — ABNORMAL HIGH (ref 65–99)
Potassium: 4.3 mmol/L (ref 3.5–5.3)
Sodium: 141 mmol/L (ref 135–146)
TOTAL PROTEIN: 6.9 g/dL (ref 6.1–8.1)

## 2016-06-12 LAB — CBC WITH DIFFERENTIAL/PLATELET
BASOS ABS: 41 {cells}/uL (ref 0–200)
BASOS PCT: 1 %
EOS ABS: 287 {cells}/uL (ref 15–500)
Eosinophils Relative: 7 %
HEMATOCRIT: 36.9 % — AB (ref 38.5–50.0)
HEMOGLOBIN: 12.1 g/dL — AB (ref 13.2–17.1)
Lymphocytes Relative: 26 %
Lymphs Abs: 1066 cells/uL (ref 850–3900)
MCH: 33.4 pg — ABNORMAL HIGH (ref 27.0–33.0)
MCHC: 32.8 g/dL (ref 32.0–36.0)
MCV: 101.9 fL — AB (ref 80.0–100.0)
MONO ABS: 369 {cells}/uL (ref 200–950)
MPV: 9.5 fL (ref 7.5–12.5)
Monocytes Relative: 9 %
Neutro Abs: 2337 cells/uL (ref 1500–7800)
Neutrophils Relative %: 57 %
Platelets: 178 10*3/uL (ref 140–400)
RBC: 3.62 MIL/uL — AB (ref 4.20–5.80)
RDW: 14.9 % (ref 11.0–15.0)
WBC: 4.1 10*3/uL (ref 3.8–10.8)

## 2016-06-13 LAB — RPR: RPR: REACTIVE — AB

## 2016-06-13 LAB — FLUORESCENT TREPONEMAL AB(FTA)-IGG-BLD: Fluorescent Treponemal ABS: REACTIVE — AB

## 2016-06-13 LAB — T-HELPER CELL (CD4) - (RCID CLINIC ONLY)
CD4 T CELL HELPER: 17 % — AB (ref 33–55)
CD4 T Cell Abs: 170 /uL — ABNORMAL LOW (ref 400–2700)

## 2016-06-13 LAB — URINE CYTOLOGY ANCILLARY ONLY
CHLAMYDIA, DNA PROBE: NEGATIVE
NEISSERIA GONORRHEA: NEGATIVE

## 2016-06-13 LAB — HIV-1 RNA QUANT-NO REFLEX-BLD
HIV 1 RNA QUANT: 100 {copies}/mL — AB (ref <20)
HIV-1 RNA Quant, Log: 2 Log copies/mL — ABNORMAL HIGH (ref <1.30)

## 2016-06-13 LAB — RPR TITER

## 2016-06-23 ENCOUNTER — Telehealth: Payer: Self-pay | Admitting: Family

## 2016-06-23 DIAGNOSIS — C61 Malignant neoplasm of prostate: Secondary | ICD-10-CM

## 2016-06-23 NOTE — Telephone Encounter (Signed)
Pt left a message on my voicemail requesting a referral to Dr Pilar Jarvis with [facility]

## 2016-06-23 NOTE — Telephone Encounter (Signed)
Called patient and left message to return call. When patient calls back, will need to know:  1) Reason/diagnosis for urology referral, then request can be sent to PCP for review. 2) Pt has not seen Melissa since 2015 and is overdue for follow-up. He needs to schedule follow-up appointment.

## 2016-06-23 NOTE — Telephone Encounter (Signed)
Pt's appt has been scheduled.   ALSO, pt says that the referral is for a fu from him having Cancer.

## 2016-06-26 ENCOUNTER — Encounter: Payer: Self-pay | Admitting: Infectious Disease

## 2016-06-26 ENCOUNTER — Ambulatory Visit (INDEPENDENT_AMBULATORY_CARE_PROVIDER_SITE_OTHER): Payer: Commercial Managed Care - HMO | Admitting: Infectious Disease

## 2016-06-26 VITALS — BP 151/83 | HR 51 | Temp 97.4°F | Ht 74.0 in | Wt 212.5 lb

## 2016-06-26 DIAGNOSIS — C61 Malignant neoplasm of prostate: Secondary | ICD-10-CM

## 2016-06-26 DIAGNOSIS — Z23 Encounter for immunization: Secondary | ICD-10-CM

## 2016-06-26 DIAGNOSIS — N184 Chronic kidney disease, stage 4 (severe): Secondary | ICD-10-CM | POA: Diagnosis not present

## 2016-06-26 DIAGNOSIS — I10 Essential (primary) hypertension: Secondary | ICD-10-CM | POA: Diagnosis not present

## 2016-06-26 DIAGNOSIS — A529 Late syphilis, unspecified: Secondary | ICD-10-CM | POA: Diagnosis not present

## 2016-06-26 DIAGNOSIS — B20 Human immunodeficiency virus [HIV] disease: Secondary | ICD-10-CM | POA: Diagnosis not present

## 2016-06-26 NOTE — Progress Notes (Signed)
Chief complaint: followup for HIV, CKD, prostate cancer  Subjective:    Patient ID: David [name], male    DOB: Oct 15, 1941, 75 y.o.   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 His VL is up to 100 likely a blip as he does not miss meds.    His RPR came up from 1:1  to 1:16 then to 1:4 and now up to 1:8 and down to 1:4.  Lab Results  Component Value Date   HIV1RNAQUANT 100 (H) 06/12/2016   HIV1RNAQUANT <20 02/16/2016   HIV1RNAQUANT <20 11/10/2015      Lab Results  Component Value Date   CD4TABS 170 (L) 06/12/2016   CD4TABS 200 (L) 02/15/2016   CD4TABS 210 (L) 11/10/2015    Past Medical History:  Diagnosis Date  . Anemia, iron deficiency 11/15/2011  . Cancer [name])    hx of prostate; s/p radioactive seed implant 10/2009 Dr Janice Norrie  . Chronic kidney disease   . Heart murmur    "mild" per pt  . History of cardiac cath 2004   negative for CAD  . History of myocardial perfusion scan 02/2010   negative for coronary insufficiency (LVEF 27%)  . HIV infection ([place])   . Hyperlipidemia   . Hypertension    followed by [facility] (Dr Dani Gobble Croitoru)  . Nonischemic cardiomyopathy ([place])   . Sinus bradycardia   . Stroke [facility])    "mini stroke" years ago  . Systolic and diastolic CHF, acute ([place]) 06/2010   felt to be secondary to hypertensive cardiomyopathy    Past Surgical History:  Procedure Laterality Date  . BASCILIC VEIN TRANSPOSITION Left 06/24/2015   Procedure: BASILIC VEIN TRANSPOSITION;  Surgeon: Mal Misty, MD;  Location: [place];  Service: Vascular;  Laterality: Left;  . CARDIAC CATHETERIZATION  02/04/2003   mildly depressed LV systolic fx EF [id number] coronaries/abdominal aorta/renal arteries.  . COLONOSCOPY    .  ESOPHAGOGASTRODUODENOSCOPY (EGD) WITH PROPOFOL N/A 05/05/2014   Procedure: ESOPHAGOGASTRODUODENOSCOPY (EGD) WITH PROPOFOL;  Surgeon: Missy Sabins, MD;  [facility];  Service: Endoscopy;  Laterality: N/A;  . EYE SURGERY Bilateral    cataract surgery   . EYE SURGERY Bilateral    glaucoma surgery  . NM MYOCAR PERF WALL MOTION  02/21/2010   normal  . RADIOACTIVE SEED IMPLANT  2010   prostate cancer  . US ECHOCARDIOGRAPHY  02/06/2012   mild LVH,LA mod. dilated,mild-mod. 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 AI.    Family History  Problem Relation Age of Onset  . Hypertension Mother   . Thyroid disease Mother   . Cholelithiasis Daughter   . Cholelithiasis Son   . Hypertension Maternal Grandmother   . Diabetes Maternal Grandmother   . Heart attack Neg Hx   . Hyperlipidemia Neg Hx       Social History   Social History  . Marital status: Married    Spouse name: N/A  . Number of children: 2  . Years of education: N/A   Occupational History  . retired Retired   Social History Main Topics  . Smoking status: Former Smoker    Quit date: 12/04/2005  . Smokeless tobacco: Never Used  .  Alcohol use Yes     Comment: once a week-wine  . Drug use: No  . Sexual activity: Not on file   Other Topics Concern  . Not on file   Social History Narrative   Stays active at home   Regular exercise: no    Allergies  Allergen Reactions  . Codeine Other (See Comments)    Causes constipation.   Blanchard Kelch [Losartan Potassium] Other (See Comments)    constipation     Current Outpatient Prescriptions:  .  Abacavir-Dolutegravir-Lamivud (TRIUMEQ) 600-50-300 MG TABS, Take 1 tablet by mouth daily., Disp: 30 tablet, Rfl: 6 .  acetaminophen (TYLENOL) 500 MG tablet, Take 500 mg by mouth every 4 (four) hours as needed. , Disp: , Rfl:  .  amLODipine (NORVASC) 10 MG tablet, TAKE 1 TABLET (10 MG TOTAL) BY MOUTH DAILY., Disp: 90 tablet, Rfl: 3 .  aspirin 325 MG EC  tablet, Take 325 mg by mouth every evening. , Disp: , Rfl:  .  brimonidine (ALPHAGAN) 0.15 % ophthalmic solution, Place 1 drop into the left eye 2 (two) times daily. Instill 1 drop in left eye bid, Disp: , Rfl: 1 .  carvedilol (COREG) 25 MG tablet, Take 1 tablet (25 mg total) by mouth 2 (two) times daily., Disp: 180 tablet, Rfl: 3 .  Difluprednate (DUREZOL) 0.05 % EMUL, Place 1 drop into the left eye every 2 (two) hours., Disp: , Rfl:  .  dorzolamide-timolol (COSOPT) 22.3-6.8 MG/ML ophthalmic solution, Place 1 drop into the left eye 2 (two) times daily. Instil 1 drop in left eye bid, Disp: , Rfl:  .  latanoprost (XALATAN) 0.005 % ophthalmic solution, Place 1 drop into the left eye at bedtime. Use 1 drop in left eye at bedtime, Disp: , Rfl:  .  terazosin (HYTRIN) 10 MG capsule, Take 10 mg by mouth at bedtime. , Disp: , Rfl:  .  Vitamin D, Ergocalciferol, (DRISDOL) 50000 UNITS CAPS capsule, TK ONE C PO Q  WEEK, Disp: , Rfl: 2   Review of Systems  Constitutional: Negative for activity change, appetite change, chills, diaphoresis, fatigue, fever and unexpected weight change.  HENT: Negative for congestion, rhinorrhea, sinus pressure, sneezing, sore throat and trouble swallowing.   Respiratory: Negative for cough, chest tightness and shortness of breath.   Cardiovascular: Negative for chest pain, palpitations and leg swelling.  Gastrointestinal: Negative for abdominal distention, abdominal pain, constipation, nausea and vomiting.  Genitourinary: Negative for hematuria.  Musculoskeletal: Negative for myalgias.  Skin: Negative for color change, pallor, rash and wound.  Neurological: Negative for dizziness, tremors, weakness and light-headedness.  Hematological: Negative for adenopathy. Does not bruise/bleed easily.  Psychiatric/Behavioral: Negative for agitation, behavioral problems, confusion, decreased concentration and sleep disturbance.       Objective:   Physical Exam  Constitutional: He is  oriented to person, place, and time. He appears well-developed and well-nourished. No distress.  HENT:  Head: Normocephalic and atraumatic.  Mouth/Throat: Oropharynx is clear and moist. No oropharyngeal exudate.  Eyes: Conjunctivae and EOM are normal. Pupils are equal, round, and reactive to light. No scleral icterus.  Neck: Normal range of motion. Neck supple. No JVD present.  Cardiovascular: Normal rate, regular rhythm and normal heart sounds.  Exam reveals no gallop and no friction rub.   No murmur heard. Pulmonary/Chest: Effort normal and breath sounds normal. No respiratory distress. He has no wheezes. He has no rales. He exhibits no tenderness.  Abdominal: He exhibits no distension and no mass. There is no tenderness.  There is no rebound and no guarding.  Musculoskeletal: He exhibits no edema or tenderness.  Lymphadenopathy:    He has no cervical adenopathy.  Neurological: He is alert and oriented to person, place, and time. He has normal reflexes. He exhibits normal muscle tone. Coordination normal.  Skin: Skin is warm and dry. He is not diaphoretic. No erythema. No pallor.  Psychiatric: He has a normal mood and affect. His behavior is normal. Judgment and thought content normal.          Assessment & Plan:   HIV/AIDS: continue TRIUMEQ, and recheck labs in July and renew SPAP in 6 months. I am OK with him not being on PCP prophylaxis given that he is hovering near 200 and suppressed.   Syphilis: Titer was 1:168 in 2000 and sp successful treatment,then back  up to 1:16 sp  retreat with IM PCN weekly x 3 weeks, --> RPR of 1:4, now --> 1:8. And to 1:4  CKD: followed by Dr Posey Pronto  HTN: followup with PCP.  There were no vitals filed for this visit.   I spent greater than 25 minutes with the patient including greater than 50% of time in face to face counsel of the patient re his HIV, syphilis, his CKD, his HTN and in coordination of his care.

## 2016-06-26 NOTE — Telephone Encounter (Signed)
Appt scheduled 06/28/16. Please advise referral.

## 2016-06-28 ENCOUNTER — Ambulatory Visit (INDEPENDENT_AMBULATORY_CARE_PROVIDER_SITE_OTHER): Payer: Commercial Managed Care - HMO | Admitting: Family

## 2016-06-28 ENCOUNTER — Encounter: Payer: Self-pay | Admitting: Family

## 2016-06-28 VITALS — BP 144/72 | HR 58 | Temp 97.7°F | Resp 18 | Ht 74.0 in | Wt 209.4 lb

## 2016-06-28 DIAGNOSIS — N184 Chronic kidney disease, stage 4 (severe): Secondary | ICD-10-CM

## 2016-06-28 DIAGNOSIS — I429 Cardiomyopathy, unspecified: Secondary | ICD-10-CM

## 2016-06-28 DIAGNOSIS — C61 Malignant neoplasm of prostate: Secondary | ICD-10-CM

## 2016-06-28 DIAGNOSIS — B20 Human immunodeficiency virus [HIV] disease: Secondary | ICD-10-CM

## 2016-06-28 DIAGNOSIS — R7309 Other abnormal glucose: Secondary | ICD-10-CM

## 2016-06-28 DIAGNOSIS — D509 Iron deficiency anemia, unspecified: Secondary | ICD-10-CM

## 2016-06-28 DIAGNOSIS — I420 Dilated cardiomyopathy: Secondary | ICD-10-CM

## 2016-06-28 DIAGNOSIS — R739 Hyperglycemia, unspecified: Secondary | ICD-10-CM

## 2016-06-28 LAB — HEMOGLOBIN A1C: HEMOGLOBIN A1C: 5.8 % (ref 4.6–6.5)

## 2016-06-28 NOTE — Assessment & Plan Note (Signed)
Clinically euvolemic 

## 2016-06-28 NOTE — Assessment & Plan Note (Signed)
Stable. Last iron level was normal. Hgb 12.1, receiving procrit injections from renal.

## 2016-06-28 NOTE — Assessment & Plan Note (Signed)
Lab Results  Component Value Date   CREATININE 3.27 (H) 06/12/2016   Following with nephrology.

## 2016-06-28 NOTE — Assessment & Plan Note (Signed)
S/p radioactive seed implant 2010. Has upcoming appointment with urology.

## 2016-06-28 NOTE — Progress Notes (Signed)
Pre visit review using our clinic review tool, if applicable. No additional management support is needed unless otherwise documented below in the visit note. 

## 2016-06-28 NOTE — Assessment & Plan Note (Signed)
Obtain a1c.

## 2016-06-28 NOTE — Assessment & Plan Note (Signed)
Followed by ID.  Last CD4 170.

## 2016-06-28 NOTE — Patient Instructions (Signed)
Please complete lab work prior to leaving.   

## 2016-06-28 NOTE — Progress Notes (Signed)
Subjective:    Patient ID: David [name], male    DOB: 03-Nov-1941, 75 y.o.   MRN: [id number]  HPI  [name] is a 75 yr old male who presents today for follow up. He has not been seen in our clinic since 2015.  HIV/Aids- He is maintained on Triumeq and managed by infectious disease- Dr. Tommy Medal.  Lab Results  Component Value Date   CD4TCELL 17 (L) 06/12/2016   CD4TABS 170 (L) 06/12/2016   HTN- maintained on amlodipine 10mg  and coreg.   BP Readings from Last 3 Encounters:  06/28/16 (!) 144/72  06/26/16 (!) 151/83  04/26/16 135/79   Iron deficiency anemia-  Lab Results  Component Value Date   WBC 4.1 06/12/2016   HGB 12.1 (L) 06/12/2016   HCT 36.9 (L) 06/12/2016   MCV 101.9 (H) 06/12/2016   PLT 178 06/12/2016   Hyperglycemia-  Lab Results  Component Value Date   HGBA1C 6.1 (H) 01/28/2014   Hyperlipidemia- (hx of TIA)  Lab Results  Component Value Date   CHOL 156 11/10/2015   HDL 45 11/10/2015   LDLCALC 95 11/10/2015   TRIG 82 11/10/2015   CHOLHDL 3.5 11/10/2015   Last eye exam- He is followed by Dr. Katy Fitch.  Reports vision is up to date.   Diastolic CHF/Non-ischemic dilated cardiomyopathy. He is followed by 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 He is followed by Dr. Posey Pronto. He is maintained on procrit.  Lab Results  Component Value Date   CREATININE 3.27 (H) 06/12/2016   Hx of prostate cancer- [facility] (he is s/p radioactive seed implant 2010)  Review of Systems See HPI  Past Medical History:  Diagnosis Date  . Anemia, iron deficiency 11/15/2011  . Cancer [facility])    hx of prostate; s/p radioactive seed implant 10/2009 Dr Janice Norrie  . Chronic kidney disease   . Heart murmur    "mild" per pt  . History of cardiac cath 2004   negative for CAD  . History of myocardial perfusion scan 02/2010   negative for coronary insufficiency (LVEF 27%)  . HIV infection ([place])   . Hyperlipidemia   . Hypertension    followed by [facility]  and Vascular (Dr Dani Gobble Croitoru)  . Nonischemic cardiomyopathy ([place])   . Sinus bradycardia   . Stroke [facility])    "mini stroke" years ago  . Systolic and diastolic CHF, acute ([place]) 06/2010   felt to be secondary to hypertensive cardiomyopathy     Social History   Social History  . Marital status: Married    Spouse name: N/A  . Number of children: 2  . Years of education: N/A   Occupational History  . retired Retired   Social History Main Topics  . Smoking status: Former Smoker    Quit date: 12/04/2005  . Smokeless tobacco: Never Used  . Alcohol use Yes     Comment: once a week-wine  . Drug use: No  . Sexual activity: Not on file   Other Topics Concern  . Not on file   Social History Narrative   Stays active at home   Regular exercise: no    Past Surgical History:  Procedure Laterality Date  . BASCILIC VEIN TRANSPOSITION Left 06/24/2015   Procedure: BASILIC VEIN TRANSPOSITION;  Surgeon: Mal Misty, MD;  Location: [facility];  Service: Vascular;  Laterality: Left;  . CARDIAC CATHETERIZATION  02/04/2003   mildly depressed LV systolic fx EF [id number]  coronaries/abdominal aorta/renal arteries.  . COLONOSCOPY    . ESOPHAGOGASTRODUODENOSCOPY (EGD) WITH PROPOFOL N/A 05/05/2014   Procedure: ESOPHAGOGASTRODUODENOSCOPY (EGD) WITH PROPOFOL;  Surgeon: Missy Sabins, MD;  Location: [facility];  Service: Endoscopy;  Laterality: N/A;  . EYE SURGERY Bilateral    cataract surgery   . EYE SURGERY Bilateral    glaucoma surgery  . NM MYOCAR PERF WALL MOTION  02/21/2010   normal  . RADIOACTIVE SEED IMPLANT  2010   prostate cancer  . US ECHOCARDIOGRAPHY  02/06/2012   mild LVH,LA mod. dilated,mild-mod. 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 AI.    Family History  Problem Relation Age of Onset  . Hypertension Mother   . Thyroid disease Mother   . Cholelithiasis Daughter   . Cholelithiasis Son   . Hypertension Maternal Grandmother   . Diabetes Maternal Grandmother    . Heart attack Neg Hx   . Hyperlipidemia Neg Hx     Allergies  Allergen Reactions  . Codeine Other (See Comments)    Causes constipation.   Blanchard Kelch [Losartan Potassium] Other (See Comments)    constipation    Current Outpatient Prescriptions on File Prior to Visit  Medication Sig Dispense Refill  . Abacavir-Dolutegravir-Lamivud (TRIUMEQ) 600-50-300 MG TABS Take 1 tablet by mouth daily. 30 tablet 6  . acetaminophen (TYLENOL) 500 MG tablet Take 500 mg by mouth every 4 (four) hours as needed.     Marland Kitchen amLODipine (NORVASC) 10 MG tablet TAKE 1 TABLET (10 MG TOTAL) BY MOUTH DAILY. 90 tablet 3  . aspirin 325 MG EC tablet Take 325 mg by mouth every evening.     . brimonidine (ALPHAGAN) 0.15 % ophthalmic solution Place 1 drop into the left eye 2 (two) times daily. Instill 1 drop in left eye bid  1  . carvedilol (COREG) 25 MG tablet Take 1 tablet (25 mg total) by mouth 2 (two) times daily. 180 tablet 3  . dorzolamide-timolol (COSOPT) 22.3-6.8 MG/ML ophthalmic solution Place 1 drop into the left eye 2 (two) times daily. Instil 1 drop in left eye bid    . latanoprost (XALATAN) 0.005 % ophthalmic solution Place 1 drop into the left eye at bedtime. Use 1 drop in left eye at bedtime    . terazosin (HYTRIN) 10 MG capsule Take 10 mg by mouth at bedtime.     . Vitamin D, Ergocalciferol, (DRISDOL) 50000 UNITS CAPS capsule TK ONE C PO Q  WEEK  2   No current facility-administered medications on file prior to visit.     BP (!) 144/72 (BP Location: Right Arm, Cuff Size: Normal)   Pulse (!) 58   Temp 97.7 F (36.5 C) (Oral)   Resp 18   Ht 6\' 2"  (1.88 m)   Wt 209 lb 6.4 oz (95 kg)   SpO2 95% Comment: room air  BMI 26.89 kg/m       Objective:   Physical Exam  Constitutional: He is oriented to person, place, and time. He appears well-developed and well-nourished. No distress.  HENT:  Head: Normocephalic and atraumatic.  Cardiovascular: Normal rate and regular rhythm.   No murmur  heard. Pulmonary/Chest: Effort normal and breath sounds normal. No respiratory distress. He has no wheezes. He has no rales.  Musculoskeletal: He exhibits no edema.  Neurological: He is alert and oriented to person, place, and time.  Skin: Skin is warm and dry.  Psychiatric: He has a normal mood and affect. His behavior is normal. Thought content  normal.          Assessment & Plan:

## 2016-06-29 ENCOUNTER — Encounter: Payer: Self-pay | Admitting: Family

## 2016-07-05 DIAGNOSIS — N184 Chronic kidney disease, stage 4 (severe): Secondary | ICD-10-CM | POA: Diagnosis not present

## 2016-07-05 DIAGNOSIS — N189 Chronic kidney disease, unspecified: Secondary | ICD-10-CM | POA: Diagnosis not present

## 2016-07-05 DIAGNOSIS — N2581 Secondary hyperparathyroidism of renal origin: Secondary | ICD-10-CM | POA: Diagnosis not present

## 2016-07-12 ENCOUNTER — Encounter: Payer: Self-pay | Admitting: Infectious Disease

## 2016-07-12 DIAGNOSIS — N184 Chronic kidney disease, stage 4 (severe): Secondary | ICD-10-CM | POA: Diagnosis not present

## 2016-07-12 DIAGNOSIS — I129 Hypertensive chronic kidney disease with stage 1 through stage 4 chronic kidney disease, or unspecified chronic kidney disease: Secondary | ICD-10-CM | POA: Diagnosis not present

## 2016-07-12 DIAGNOSIS — D631 Anemia in chronic kidney disease: Secondary | ICD-10-CM | POA: Diagnosis not present

## 2016-07-12 DIAGNOSIS — N2581 Secondary hyperparathyroidism of renal origin: Secondary | ICD-10-CM | POA: Diagnosis not present

## 2016-07-12 DIAGNOSIS — Z6827 Body mass index (BMI) 27.0-27.9, adult: Secondary | ICD-10-CM | POA: Diagnosis not present

## 2016-07-14 ENCOUNTER — Other Ambulatory Visit: Payer: Self-pay | Admitting: Vascular Surgery

## 2016-07-14 DIAGNOSIS — Z01810 Encounter for preprocedural cardiovascular examination: Secondary | ICD-10-CM

## 2016-07-14 DIAGNOSIS — N184 Chronic kidney disease, stage 4 (severe): Secondary | ICD-10-CM

## 2016-07-28 ENCOUNTER — Encounter (HOSPITAL_COMMUNITY): Payer: Commercial Managed Care - HMO

## 2016-07-28 ENCOUNTER — Other Ambulatory Visit (HOSPITAL_COMMUNITY): Payer: Commercial Managed Care - HMO

## 2016-07-28 ENCOUNTER — Ambulatory Visit: Payer: Commercial Managed Care - HMO | Admitting: Vascular Surgery

## 2016-08-01 ENCOUNTER — Ambulatory Visit: Payer: Commercial Managed Care - HMO | Admitting: Family

## 2016-08-02 DIAGNOSIS — H401133 Primary open-angle glaucoma, bilateral, severe stage: Secondary | ICD-10-CM | POA: Diagnosis not present

## 2016-08-02 DIAGNOSIS — Z961 Presence of intraocular lens: Secondary | ICD-10-CM | POA: Diagnosis not present

## 2016-08-02 DIAGNOSIS — C61 Malignant neoplasm of prostate: Secondary | ICD-10-CM | POA: Diagnosis not present

## 2016-08-08 DIAGNOSIS — C61 Malignant neoplasm of prostate: Secondary | ICD-10-CM | POA: Diagnosis not present

## 2016-08-08 DIAGNOSIS — N400 Enlarged prostate without lower urinary tract symptoms: Secondary | ICD-10-CM | POA: Diagnosis not present

## 2016-08-10 ENCOUNTER — Encounter: Payer: Self-pay | Admitting: Vascular Surgery

## 2016-08-15 ENCOUNTER — Ambulatory Visit (INDEPENDENT_AMBULATORY_CARE_PROVIDER_SITE_OTHER): Payer: Commercial Managed Care - HMO | Admitting: Vascular Surgery

## 2016-08-15 ENCOUNTER — Ambulatory Visit (INDEPENDENT_AMBULATORY_CARE_PROVIDER_SITE_OTHER)
Admission: RE | Admit: 2016-08-15 | Discharge: 2016-08-15 | Disposition: A | Discharge status: Home / Self Care | Payer: Commercial Managed Care - HMO | Source: Ambulatory Visit | Attending: Vascular Surgery | Admitting: Vascular Surgery

## 2016-08-15 ENCOUNTER — Ambulatory Visit (HOSPITAL_COMMUNITY)
Admission: RE | Admit: 2016-08-15 | Discharge: 2016-08-15 | Disposition: A | Discharge status: Home / Self Care | Payer: Commercial Managed Care - HMO | Source: Ambulatory Visit | Attending: Vascular Surgery | Admitting: Vascular Surgery

## 2016-08-15 ENCOUNTER — Encounter: Payer: Self-pay | Admitting: Vascular Surgery

## 2016-08-15 ENCOUNTER — Other Ambulatory Visit: Payer: Self-pay

## 2016-08-15 VITALS — BP 131/79 | HR 60 | Temp 98.8°F | Resp 16 | Ht 74.0 in | Wt 212.0 lb

## 2016-08-15 DIAGNOSIS — N184 Chronic kidney disease, stage 4 (severe): Secondary | ICD-10-CM

## 2016-08-15 DIAGNOSIS — Z01810 Encounter for preprocedural cardiovascular examination: Secondary | ICD-10-CM | POA: Insufficient documentation

## 2016-08-15 NOTE — Progress Notes (Signed)
Patient name: David [name] MRN: [id number] DOB: [birth year] Sex: male  REASON FOR VISIT: Scheduled hemodialysis access  HPI: David [name] is a 75 y.o. male her today for discussion of hemodialysis access. He underwent left basilic vein transposition with Dr. Kellie Simmering lap 2016. He noted one month ago that he had tenderness and firmness over this area and clinically had thrombosed the upper arm fistula. He had never had use of this. He still not is on hemodialysis. Seen today for further discussion of new access placement. He is right handed. Has never had any access procedures in his right extremity  Past Medical History:  Diagnosis Date  . Anemia, iron deficiency 11/15/2011  . Cancer [facility])    hx of prostate; s/p radioactive seed implant 10/2009 Dr Janice Norrie  . Chronic kidney disease   . Heart murmur    "mild" per pt  . History of cardiac cath 2004   negative for CAD  . History of myocardial perfusion scan 02/2010   negative for coronary insufficiency (LVEF 27%)  . HIV infection ([place])   . Hyperlipidemia   . Hypertension    followed by [facility] and Vascular (Dr Dani Gobble Croitoru)  . Nonischemic cardiomyopathy ([place])   . Sinus bradycardia   . Stroke [facility])    "mini stroke" years ago  . Systolic and diastolic CHF, acute ([place]) 06/2010   felt to be secondary to hypertensive cardiomyopathy    Family History  Problem Relation Age of Onset  . Hypertension Mother   . Thyroid disease Mother   . Cholelithiasis Daughter   . Cholelithiasis Son   . Hypertension Maternal Grandmother   . Diabetes Maternal Grandmother   . Heart attack Neg Hx   . Hyperlipidemia Neg Hx     SOCIAL HISTORY: Social History  Substance Use Topics  . Smoking status: Former Smoker    Quit date: 12/04/2005  . Smokeless tobacco: Never Used  . Alcohol use Yes     Comment: once a week-wine    Allergies  Allergen Reactions  . Codeine Other (See Comments)    Causes constipation.   Blanchard Kelch [Losartan  Potassium] Other (See Comments)    constipation    Current Outpatient Prescriptions  Medication Sig Dispense Refill  . Abacavir-Dolutegravir-Lamivud (TRIUMEQ) 600-50-300 MG TABS Take 1 tablet by mouth daily. 30 tablet 6  . acetaminophen (TYLENOL) 500 MG tablet Take 500 mg by mouth every 4 (four) hours as needed.     Marland Kitchen amLODipine (NORVASC) 10 MG tablet TAKE 1 TABLET (10 MG TOTAL) BY MOUTH DAILY. 90 tablet 3  . aspirin 325 MG EC tablet Take 325 mg by mouth every evening.     . brimonidine (ALPHAGAN) 0.15 % ophthalmic solution Place 1 drop into the left eye 2 (two) times daily. Instill 1 drop in left eye bid  1  . carvedilol (COREG) 25 MG tablet Take 1 tablet (25 mg total) by mouth 2 (two) times daily. 180 tablet 3  . dorzolamide-timolol (COSOPT) 22.3-6.8 MG/ML ophthalmic solution Place 1 drop into the left eye 2 (two) times daily. Instil 1 drop in left eye bid    . latanoprost (XALATAN) 0.005 % ophthalmic solution Place 1 drop into the left eye at bedtime. Use 1 drop in left eye at bedtime    . Multiple Vitamin (MULTIVITAMIN) tablet Take 1 tablet by mouth daily.    Marland Kitchen terazosin (HYTRIN) 10 MG capsule Take 10 mg by mouth at bedtime.     . Vitamin D, Ergocalciferol, (  DRISDOL) 50000 UNITS CAPS capsule TK ONE C PO Q  WEEK  2   No current facility-administered medications for this visit.     REVIEW OF SYSTEMS:  [X]  denotes positive finding, [ ]  denotes negative finding Cardiac  Comments:  Chest pain or chest pressure:    Shortness of breath upon exertion:    Short of breath when lying flat:    Irregular heart rhythm:        Vascular    Pain in calf, thigh, or hip brought on by ambulation:    Pain in feet at night that wakes you up from your sleep:     Blood clot in your veins:    Leg swelling:         Pulmonary    Oxygen at home:    Productive cough:     Wheezing:         Neurologic    Sudden weakness in arms or legs:     Sudden numbness in arms or legs:     Sudden onset of  difficulty speaking or slurred speech:    Temporary loss of vision in one eye:     Problems with dizziness:         Gastrointestinal    Blood in stool:     Vomited blood:         Genitourinary    Burning when urinating:     Blood in urine:        Psychiatric    Major depression:         Hematologic    Bleeding problems:    Problems with blood clotting too easily:        Skin    Rashes or ulcers:        Constitutional    Fever or chills:      PHYSICAL EXAM: Vitals:   08/15/16 1114  BP: 131/79  Pulse: 60  Resp: 16  Temp: 98.8 F (37.1 C)  TempSrc: Oral  SpO2: 100%  Weight: 212 lb (96.2 kg)  Height: 6\' 2"  (1.88 m)    GENERAL: The patient is a well-nourished male, in no acute distress. The vital signs are documented above. CARDIAC: There is a regular rate and rhythm.  VASCULAR: 2+ radial pulses bilaterally. Thrombosed left upper arm AV fistula PULMONARY: There is good air exchange MUSCULOSKELETAL: There are no major deformities or cyanosis. NEUROLOGIC: No focal weakness or paresthesias are detected. SKIN: There are no ulcers or rashes noted. PSYCHIATRIC: The patient has a normal affect.  DATA:   Right arm vein mapping today shows mall cephalic vein throughout its course. The silicone vein is in the 2 mm to 3 and had millimeter range. Normal triphasic arterial flow to his wrist bilaterally  MEDICAL ISSUES:  Discussed options with patient. Have recommended attempt fistula probably with basilic vein transposition in his right arm. Expanded he may eventually have to have a prosthetic graft but would defer this if possible. 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 He will schedule this at his earliest convenience    Briunna Leicht [facility] [employer] [phone]

## 2016-08-18 ENCOUNTER — Other Ambulatory Visit: Payer: Self-pay | Admitting: Internal Medicine

## 2016-08-18 DIAGNOSIS — B20 Human immunodeficiency virus [HIV] disease: Secondary | ICD-10-CM

## 2016-10-10 DIAGNOSIS — N2581 Secondary hyperparathyroidism of renal origin: Secondary | ICD-10-CM | POA: Diagnosis not present

## 2016-10-10 DIAGNOSIS — N184 Chronic kidney disease, stage 4 (severe): Secondary | ICD-10-CM | POA: Diagnosis not present

## 2016-10-11 ENCOUNTER — Encounter (HOSPITAL_COMMUNITY): Payer: Self-pay | Admitting: *Deleted

## 2016-10-11 NOTE — Progress Notes (Signed)
Spoke with pt for pre-op call. Pt denies cardiac history except for a "mild" heart murmur. Pt denies any recent chest pain or sob. Pt is not diabetic.

## 2016-10-12 NOTE — Anesthesia Preprocedure Evaluation (Addendum)
Anesthesia Evaluation  Patient identified by MRN, date of birth, ID band Patient awake    Reviewed: Allergy & Precautions, NPO status , Patient's Chart, lab work & pertinent test results  Airway Mallampati: II  TM Distance: >3 FB Neck ROM: Full    Dental  (+) Teeth Intact, Dental Advisory Given   Pulmonary former smoker,    breath sounds clear to auscultation       Cardiovascular hypertension, Pt. on medications and Pt. on home beta blockers +CHF   Rhythm:Regular     Neuro/Psych CVA    GI/Hepatic negative GI ROS, Neg liver ROS,   Endo/Other  negative endocrine ROS  Renal/GU Renal disease     Musculoskeletal  (+) Arthritis ,   Abdominal   Peds  Hematology  (+) HIV,   Anesthesia Other Findings   Reproductive/Obstetrics                            Lab Results  Component Value Date   WBC 4.1 06/12/2016   HGB 12.1 (L) 06/12/2016   HCT 36.9 (L) 06/12/2016   MCV 101.9 (H) 06/12/2016   PLT 178 06/12/2016   Lab Results  Component Value Date   CREATININE 3.27 (H) 06/12/2016   BUN 33 (H) 06/12/2016   NA 141 06/12/2016   K 4.3 06/12/2016   CL 105 06/12/2016   CO2 26 06/12/2016    Anesthesia Physical Anesthesia Plan  ASA: III  Anesthesia Plan: MAC   Post-op Pain Management:    Induction: Intravenous  Airway Management Planned: Natural Airway and Simple Face Mask  Additional Equipment:   Intra-op Plan:   Post-operative Plan:   Informed Consent:   Dental advisory given  Plan Discussed with: CRNA and Anesthesiologist  Anesthesia Plan Comments:        Anesthesia Quick Evaluation

## 2016-10-13 ENCOUNTER — Other Ambulatory Visit: Payer: Self-pay | Admitting: *Deleted

## 2016-10-13 ENCOUNTER — Ambulatory Visit (HOSPITAL_COMMUNITY): Payer: Commercial Managed Care - HMO | Admitting: Anesthesiology

## 2016-10-13 ENCOUNTER — Ambulatory Visit (HOSPITAL_COMMUNITY)
Admission: RE | Admit: 2016-10-13 | Discharge: 2016-10-13 | Disposition: A | Discharge status: Home / Self Care | Payer: Commercial Managed Care - HMO | Source: Ambulatory Visit | Attending: Vascular Surgery | Admitting: Vascular Surgery

## 2016-10-13 ENCOUNTER — Encounter (HOSPITAL_COMMUNITY): Payer: Self-pay | Admitting: *Deleted

## 2016-10-13 ENCOUNTER — Encounter (HOSPITAL_COMMUNITY)
Admission: RE | Disposition: A | Discharge status: Home / Self Care | Payer: Self-pay | Source: Ambulatory Visit | Attending: Vascular Surgery

## 2016-10-13 DIAGNOSIS — E785 Hyperlipidemia, unspecified: Secondary | ICD-10-CM | POA: Diagnosis not present

## 2016-10-13 DIAGNOSIS — I428 Other cardiomyopathies: Secondary | ICD-10-CM | POA: Insufficient documentation

## 2016-10-13 DIAGNOSIS — N185 Chronic kidney disease, stage 5: Secondary | ICD-10-CM | POA: Diagnosis not present

## 2016-10-13 DIAGNOSIS — N186 End stage renal disease: Secondary | ICD-10-CM

## 2016-10-13 DIAGNOSIS — Z79899 Other long term (current) drug therapy: Secondary | ICD-10-CM | POA: Insufficient documentation

## 2016-10-13 DIAGNOSIS — I130 Hypertensive heart and chronic kidney disease with heart failure and stage 1 through stage 4 chronic kidney disease, or unspecified chronic kidney disease: Secondary | ICD-10-CM | POA: Diagnosis not present

## 2016-10-13 DIAGNOSIS — Z87891 Personal history of nicotine dependence: Secondary | ICD-10-CM | POA: Insufficient documentation

## 2016-10-13 DIAGNOSIS — N184 Chronic kidney disease, stage 4 (severe): Secondary | ICD-10-CM | POA: Diagnosis not present

## 2016-10-13 DIAGNOSIS — Z8546 Personal history of malignant neoplasm of prostate: Secondary | ICD-10-CM | POA: Diagnosis not present

## 2016-10-13 DIAGNOSIS — B20 Human immunodeficiency virus [HIV] disease: Secondary | ICD-10-CM | POA: Diagnosis not present

## 2016-10-13 DIAGNOSIS — Z7982 Long term (current) use of aspirin: Secondary | ICD-10-CM | POA: Diagnosis not present

## 2016-10-13 DIAGNOSIS — Z4931 Encounter for adequacy testing for hemodialysis: Secondary | ICD-10-CM

## 2016-10-13 DIAGNOSIS — Z8673 Personal history of transient ischemic attack (TIA), and cerebral infarction without residual deficits: Secondary | ICD-10-CM | POA: Diagnosis not present

## 2016-10-13 DIAGNOSIS — I5030 Unspecified diastolic (congestive) heart failure: Secondary | ICD-10-CM | POA: Diagnosis not present

## 2016-10-13 DIAGNOSIS — I5042 Chronic combined systolic (congestive) and diastolic (congestive) heart failure: Secondary | ICD-10-CM | POA: Insufficient documentation

## 2016-10-13 HISTORY — DX: Unspecified osteoarthritis, unspecified site: M19.90

## 2016-10-13 HISTORY — DX: Unspecified glaucoma: H40.9

## 2016-10-13 HISTORY — PX: AV FISTULA PLACEMENT: SHX1204

## 2016-10-13 SURGERY — ARTERIOVENOUS (AV) FISTULA CREATION
Anesthesia: Monitor Anesthesia Care | Laterality: Right

## 2016-10-13 MED ORDER — PROTAMINE SULFATE 10 MG/ML IV SOLN
INTRAVENOUS | Status: AC
  Filled 2016-10-13: qty 5

## 2016-10-13 MED ORDER — CHLORHEXIDINE GLUCONATE CLOTH 2 % EX PADS: 6.0000 | MEDICATED_PAD | Freq: Once | CUTANEOUS | Status: DC

## 2016-10-13 MED ORDER — EPHEDRINE 5 MG/ML INJ
INTRAVENOUS | Status: AC
  Filled 2016-10-13: qty 10

## 2016-10-13 MED ORDER — HEPARIN SODIUM (PORCINE) 1000 UNIT/ML IJ SOLN
INTRAMUSCULAR | Status: AC
  Filled 2016-10-13: qty 1

## 2016-10-13 MED ORDER — SUCCINYLCHOLINE CHLORIDE 200 MG/10ML IV SOSY
PREFILLED_SYRINGE | INTRAVENOUS | Status: AC
Start: 2016-10-13 — End: 2016-10-13
  Filled 2016-10-13: qty 10

## 2016-10-13 MED ORDER — PROPOFOL 10 MG/ML IV BOLUS
INTRAVENOUS | Status: AC
  Filled 2016-10-13: qty 20

## 2016-10-13 MED ORDER — FENTANYL CITRATE (PF) 100 MCG/2ML IJ SOLN: 25.0000 ug | INTRAMUSCULAR | Status: DC | PRN

## 2016-10-13 MED ORDER — OXYCODONE-ACETAMINOPHEN 5-325 MG PO TABS: 1.0000 | ORAL_TABLET | Freq: Four times a day (QID) | ORAL | 0 refills | Status: DC | PRN

## 2016-10-13 MED ORDER — FENTANYL CITRATE (PF) 250 MCG/5ML IJ SOLN
INTRAMUSCULAR | Status: AC
  Filled 2016-10-13: qty 5

## 2016-10-13 MED ORDER — ONDANSETRON HCL 4 MG/2ML IJ SOLN: 4.0000 mg | Freq: Once | INTRAMUSCULAR | Status: DC | PRN

## 2016-10-13 MED ORDER — 0.9 % SODIUM CHLORIDE (POUR BTL) OPTIME
TOPICAL | Status: DC | PRN
  Administered 2016-10-13: 1000 mL

## 2016-10-13 MED ORDER — LIDOCAINE-EPINEPHRINE 1 %-1:100000 IJ SOLN
INTRAMUSCULAR | Status: AC
  Filled 2016-10-13: qty 1

## 2016-10-13 MED ORDER — LIDOCAINE-EPINEPHRINE 1 %-1:100000 IJ SOLN
INTRAMUSCULAR | Status: DC | PRN
  Administered 2016-10-13: 30 mL

## 2016-10-13 MED ORDER — DEXTROSE 5 % IV SOLN
1.5000 g | INTRAVENOUS | Status: AC
  Administered 2016-10-13: 1.5 g via INTRAVENOUS
  Filled 2016-10-13: qty 1.5

## 2016-10-13 MED ORDER — PROPOFOL 500 MG/50ML IV EMUL
INTRAVENOUS | Status: DC | PRN
  Administered 2016-10-13: 75 ug/kg/min via INTRAVENOUS

## 2016-10-13 MED ORDER — SODIUM CHLORIDE 0.9 % IV SOLN
INTRAVENOUS | Status: DC
  Administered 2016-10-13 (×2): via INTRAVENOUS

## 2016-10-13 MED ORDER — HEPARIN SODIUM (PORCINE) 5000 UNIT/ML IJ SOLN
INTRAMUSCULAR | Status: DC | PRN
  Administered 2016-10-13: 500 mL

## 2016-10-13 MED ORDER — PHENYLEPHRINE 40 MCG/ML (10ML) SYRINGE FOR IV PUSH (FOR BLOOD PRESSURE SUPPORT)
PREFILLED_SYRINGE | INTRAVENOUS | Status: AC
  Filled 2016-10-13: qty 10

## 2016-10-13 MED ORDER — FENTANYL CITRATE (PF) 100 MCG/2ML IJ SOLN
INTRAMUSCULAR | Status: DC | PRN
  Administered 2016-10-13: 100 ug via INTRAVENOUS

## 2016-10-13 MED ORDER — LIDOCAINE 2% (20 MG/ML) 5 ML SYRINGE
INTRAMUSCULAR | Status: AC
  Filled 2016-10-13: qty 5

## 2016-10-13 SURGICAL SUPPLY — 31 items
ADH SKN CLS APL DERMABOND .7 (GAUZE/BANDAGES/DRESSINGS) ×1
ARMBAND PINK RESTRICT EXTREMIT (MISCELLANEOUS) ×4 IMPLANT
CANISTER SUCTION 2500CC (MISCELLANEOUS) ×3 IMPLANT
CANNULA VESSEL 3MM 2 BLNT TIP (CANNULA) ×3 IMPLANT
CLIP LIGATING EXTRA MED SLVR (CLIP) ×3 IMPLANT
CLIP LIGATING EXTRA SM BLUE (MISCELLANEOUS) ×3 IMPLANT
COVER PROBE W GEL 5X96 (DRAPES) ×3 IMPLANT
DECANTER SPIKE VIAL GLASS SM (MISCELLANEOUS) ×1 IMPLANT
DERMABOND ADVANCED (GAUZE/BANDAGES/DRESSINGS) ×2
DERMABOND ADVANCED .7 DNX12 (GAUZE/BANDAGES/DRESSINGS) ×1 IMPLANT
ELECT REM PT RETURN 9FT ADLT (ELECTROSURGICAL) ×3
ELECTRODE REM PT RTRN 9FT ADLT (ELECTROSURGICAL) ×1 IMPLANT
GAUZE SPONGE 4X4 12PLY STRL (GAUZE/BANDAGES/DRESSINGS) ×3 IMPLANT
GEL ULTRASOUND 20GR AQUASONIC (MISCELLANEOUS) IMPLANT
GLOVE BIOGEL PI IND STRL 7.5 (GLOVE) IMPLANT
GLOVE BIOGEL PI INDICATOR 7.5 (GLOVE) ×2
GLOVE ECLIPSE 7.0 STRL STRAW (GLOVE) ×2 IMPLANT
GLOVE SS BIOGEL STRL SZ 7.5 (GLOVE) ×1 IMPLANT
GLOVE SUPERSENSE BIOGEL SZ 7.5 (GLOVE) ×2
GOWN STRL REUS W/ TWL LRG LVL3 (GOWN DISPOSABLE) ×3 IMPLANT
GOWN STRL REUS W/TWL LRG LVL3 (GOWN DISPOSABLE) ×9
KIT BASIN OR (CUSTOM PROCEDURE TRAY) ×3 IMPLANT
KIT ROOM TURNOVER OR (KITS) ×3 IMPLANT
NS IRRIG 1000ML POUR BTL (IV SOLUTION) ×3 IMPLANT
PACK CV ACCESS (CUSTOM PROCEDURE TRAY) ×3 IMPLANT
PAD ARMBOARD 7.5X6 YLW CONV (MISCELLANEOUS) ×6 IMPLANT
SUT PROLENE 6 0 CC (SUTURE) ×3 IMPLANT
SUT VIC AB 3-0 SH 27 (SUTURE) ×3
SUT VIC AB 3-0 SH 27X BRD (SUTURE) ×1 IMPLANT
UNDERPAD 30X30 (UNDERPADS AND DIAPERS) ×3 IMPLANT
WATER STERILE IRR 1000ML POUR (IV SOLUTION) ×1 IMPLANT

## 2016-10-13 NOTE — Anesthesia Procedure Notes (Signed)
Procedure Name: MAC Date/Time: 10/13/2016 9:23 AM Performed by: Trixie Deis A Pre-anesthesia Checklist: Patient identified, Emergency Drugs available, Suction available and Patient being monitored Oxygen Delivery Method: Simple face mask Placement Confirmation: positive ETCO2 Comments: Nasal trumpet size 8.0 L nare

## 2016-10-13 NOTE — Anesthesia Postprocedure Evaluation (Signed)
Anesthesia Post Note  Patient: David [name]  Procedure(s) Performed: Procedure(s) (LRB): ARTERIOVENOUS (AV) FISTULA CREATION (Right)  Patient location during evaluation: PACU Anesthesia Type: General Level of consciousness: awake, awake and alert and oriented Pain management: pain level controlled Vital Signs Assessment: post-procedure vital signs reviewed and stable Respiratory status: spontaneous breathing, nonlabored ventilation and respiratory function stable Cardiovascular status: blood pressure returned to baseline Anesthetic complications: no    Last Vitals:  Vitals:   10/13/16 1115 10/13/16 1123  BP: 119/70 121/65  Pulse: (!) 59 (!) 51  Resp: 15 16  Temp: 36.6 C     Last Pain:  Vitals:   10/13/16 1045  TempSrc:   PainSc: Asleep                 Yasmine Kilbourne COKER

## 2016-10-13 NOTE — H&P (Signed)
Office Visit   08/15/2016 [facility] -Sharman Crate, MD  Vascular Surgery   Chronic kidney disease (CKD), stage IV (severe) ([place])  Dx   Re-evaluation ; Referred by [name], NP  Reason for Visit   Additional Documentation   Vitals:   BP 131/79 (BP Location: Right Arm, Patient Position: Sitting, Cuff Size: Large)   Pulse 60   Temp 98.8 F (37.1 C) (Oral)   Resp 16   Ht 6\' 2"  (1.88 m)   Wt 212 lb (96.2 kg)   SpO2 100%   BMI 27.22 kg/m   BSA 2.24 m   Flowsheets:   Infectious Disease Screening,   Amb Nursing Assessment,   Healthcare Directives,   Custom Formula Data,   Vital Signs,   MEWS Score,   Anthropometrics     Encounter Info:   Billing Info,   History,   Allergies,   Detailed Report     All Notes   Progress Notes by [name], MD at 08/15/2016 1:15 PM   Author: [name], MD Author Type: Physician Filed: 08/15/2016 1:03 PM  Note Status: Signed Cosign: Cosign Not Required Encounter Date: 08/15/2016 1:15 PM  Editor: [name], MD (Physician)      Patient name: JERAMIE SCOGIN          MRN: [id number]        DOB: 13-Jan-1941          Sex: male  REASON FOR VISIT: Scheduled hemodialysis access  HPI: NIRAV SWEDA is a 75 y.o. male her today for discussion of hemodialysis access. He underwent left basilic vein transposition with Dr. Kellie Simmering lap 2016. He noted one month ago that he had tenderness and firmness over this area and clinically had thrombosed the upper arm fistula. He had never had use of this. He still not is on hemodialysis. Seen today for further discussion of new access placement. He is right handed. Has never had any access procedures in his right extremity      Past Medical History:  Diagnosis Date  . Anemia, iron deficiency 11/15/2011  . Cancer [facility])    hx of prostate; s/p radioactive seed implant 10/2009 Dr Janice Norrie  . Chronic kidney disease   . Heart murmur    "mild" per pt  . History  of cardiac cath 2004   negative for CAD  . History of myocardial perfusion scan 02/2010   negative for coronary insufficiency (LVEF 27%)  . HIV infection ([place])   . Hyperlipidemia   . Hypertension    followed by [facility] and Vascular (Dr Dani Gobble Croitoru)  . Nonischemic cardiomyopathy ([place])   . Sinus bradycardia   . Stroke [facility])    "mini stroke" years ago  . Systolic and diastolic CHF, acute ([place]) 06/2010   felt to be secondary to hypertensive cardiomyopathy         Family History  Problem Relation Age of Onset  . Hypertension Mother   . Thyroid disease Mother   . Cholelithiasis Daughter   . Cholelithiasis Son   . Hypertension Maternal Grandmother   . Diabetes Maternal Grandmother   . Heart attack Neg Hx   . Hyperlipidemia Neg Hx     SOCIAL HISTORY:        Social History  Substance Use Topics  . Smoking status: Former Smoker    Quit date: 12/04/2005  . Smokeless tobacco: Never Used  . Alcohol use Yes  Comment: once a week-wine         Allergies  Allergen Reactions  . Codeine Other (See Comments)    Causes constipation.   Blanchard Kelch [Losartan Potassium] Other (See Comments)    constipation          Current Outpatient Prescriptions  Medication Sig Dispense Refill  . Abacavir-Dolutegravir-Lamivud (TRIUMEQ) 600-50-300 MG TABS Take 1 tablet by mouth daily. 30 tablet 6  . acetaminophen (TYLENOL) 500 MG tablet Take 500 mg by mouth every 4 (four) hours as needed.     Marland Kitchen amLODipine (NORVASC) 10 MG tablet TAKE 1 TABLET (10 MG TOTAL) BY MOUTH DAILY. 90 tablet 3  . aspirin 325 MG EC tablet Take 325 mg by mouth every evening.     . brimonidine (ALPHAGAN) 0.15 % ophthalmic solution Place 1 drop into the left eye 2 (two) times daily. Instill 1 drop in left eye bid  1  . carvedilol (COREG) 25 MG tablet Take 1 tablet (25 mg total) by mouth 2 (two) times daily. 180 tablet 3  . dorzolamide-timolol (COSOPT) 22.3-6.8 MG/ML ophthalmic  solution Place 1 drop into the left eye 2 (two) times daily. Instil 1 drop in left eye bid    . latanoprost (XALATAN) 0.005 % ophthalmic solution Place 1 drop into the left eye at bedtime. Use 1 drop in left eye at bedtime    . Multiple Vitamin (MULTIVITAMIN) tablet Take 1 tablet by mouth daily.    Marland Kitchen terazosin (HYTRIN) 10 MG capsule Take 10 mg by mouth at bedtime.     . Vitamin D, Ergocalciferol, (DRISDOL) 50000 UNITS CAPS capsule TK ONE C PO Q  WEEK  2   No current facility-administered medications for this visit.     REVIEW OF SYSTEMS:  [X]  denotes positive finding, [ ]  denotes negative finding Cardiac  Comments:  Chest pain or chest pressure:    Shortness of breath upon exertion:    Short of breath when lying flat:    Irregular heart rhythm:        Vascular    Pain in calf, thigh, or hip brought on by ambulation:    Pain in feet at night that wakes you up from your sleep:     Blood clot in your veins:    Leg swelling:         Pulmonary    Oxygen at home:    Productive cough:     Wheezing:         Neurologic    Sudden weakness in arms or legs:     Sudden numbness in arms or legs:     Sudden onset of difficulty speaking or slurred speech:    Temporary loss of vision in one eye:     Problems with dizziness:         Gastrointestinal    Blood in stool:     Vomited blood:         Genitourinary    Burning when urinating:     Blood in urine:        Psychiatric    Major depression:         Hematologic    Bleeding problems:    Problems with blood clotting too easily:        Skin    Rashes or ulcers:        Constitutional    Fever or chills:      PHYSICAL EXAM:    Vitals:   08/15/16 1114  BP:  131/79  Pulse: 60  Resp: 16  Temp: 98.8 F (37.1 C)  TempSrc: Oral  SpO2: 100%  Weight: 212 lb (96.2 kg)  Height: 6\' 2"  (1.88 m)    GENERAL: The patient  is a well-nourished male, in no acute distress. The vital signs are documented above. CARDIAC: There is a regular rate and rhythm.  VASCULAR: 2+ radial pulses bilaterally. Thrombosed left upper arm AV fistula PULMONARY: There is good air exchange MUSCULOSKELETAL: There are no major deformities or cyanosis. NEUROLOGIC: No focal weakness or paresthesias are detected. SKIN: There are no ulcers or rashes noted. PSYCHIATRIC: The patient has a normal affect.  DATA:   Right arm vein mapping today shows mall cephalic vein throughout its course. The silicone vein is in the 2 mm to 3 and had millimeter range. Normal triphasic arterial flow to his wrist bilaterally  MEDICAL ISSUES:  Discussed options with patient. Have recommended attempt fistula probably with basilic vein transposition in his right arm. Expanded he may eventually have to have a prosthetic graft but would defer this if possible. 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 He will schedule this at his earliest convenience    Hammond Obeirne [facility] [phone]      Instructions    After Visit Summary (Printed 08/15/2016)  Communications      [facility] Provider CC Chart Rep sent to Mosie Lukes, MD and [name], NP  Media   Electronic signature on 08/15/2016 10:36 AM   Orders Placed    None  Medication Changes     None    Medication List  Visit Diagnoses      Chronic kidney disease (CKD), stage IV (severe) ([place])    Problem List  Level of Service   Level of Service  PR OFFICE OUTPATIENT VISIT 15 MINUTES [99213]  All Charges for This Encounter   Code Description Service Date Service Provider Modifiers Qty  [phone] PR OFFICE OUTPATIENT VISIT 15 MINUTES 08/15/2016 [name], MD  1   Addendum:  The patient has been re-examined and re-evaluated.   The patient's history and physical has been reviewed and is unchanged.    DEWITT JUDICE is a 75 y.o. male is being admitted with Stage IV Chronic Kidney Disease N18.4. All the risks, benefits and other treatment options have been discussed with the patient. The patient has consented to proceed with Procedure(s): ARTERIOVENOUS (AV) FISTULA CREATION as a surgical intervention.  Curt Jews 10/13/2016 7:33 AM [facility]

## 2016-10-13 NOTE — Op Note (Signed)
    OPERATIVE REPORT  DATE OF SURGERY: 10/13/2016  PATIENT: David [name], 75 y.o. male MRN: [id number]  DOB: Mar 03, 1941  PRE-OPERATIVE DIAGNOSIS: Chronic renal insufficiency  POST-OPERATIVE DIAGNOSIS:  Same  PROCEDURE: Right arm first stage basilic vein fistula  SURGEON:  Curt Jews, M.D.  PHYSICIAN ASSISTANT: Gerri Lins PA-C  ANESTHESIA:  Local with sedation  EBL: Minimal ml  No intake/output data recorded.  BLOOD ADMINISTERED: None  DRAINS: None  SPECIMEN: None  COUNTS CORRECT:  YES  PLAN OF CARE: PACU   PATIENT DISPOSITION:  PACU - hemodynamically stable  PROCEDURE DETAILS: Patient was taken to the operating placed supine position where the area of the right arm prepped draped in sterile fashion. SonoSite ultrasound revealed good size of her basilic vein in the mid to distal upper arm. There was branching above the elbow with smaller caliber down to the antecubital space and then on the more medial aspect as well. Using local anesthesia incision was made over the antecubital space ^ M isolate the brachial artery which was of large caliber. The branch of the antecubital vein was moderate size. This was mobilized and was ligated distally and was divided and gently dilated was felt to be adequate for first stage basilic vein fistula. The artery was occluded proximally and distally was opened with 11 blade some ulcerative Potts scissors. The vein was cut to appropriate length and was spatulated and sewn end-to-side to the artery with a running 6-0 Prolene suture. Clamps removed and good thrill was noted. The wound irrigated with saline. Hemostasis tablet cautery. Wounds were closed with 3-0 Vicryl in the subcutaneous and subcuticular tissue. Dermabond was placed over the wound and the patient was transferred to the recovery room in stable condition   [name], M.D., [facility] 10/13/2016 10:30 AM

## 2016-10-13 NOTE — Transfer of Care (Signed)
Immediate Anesthesia Transfer of Care Note  Patient: NIRAJ KUDRNA  Procedure(s) Performed: Procedure(s): ARTERIOVENOUS (AV) FISTULA CREATION (Right)  Patient Location: PACU  Anesthesia Type:MAC  Level of Consciousness: sedated  Airway & Oxygen Therapy: Patient Spontanous Breathing  Post-op Assessment: Report given to RN and Post -op Vital signs reviewed and stable  Post vital signs: Reviewed and stable  Last Vitals:  Vitals:   10/13/16 0719  BP: (!) 151/75  Pulse: 82  Resp: 18  Temp: 36.6 C    Last Pain:  Vitals:   10/13/16 0719  TempSrc: Oral      Patients Stated Pain Goal: 8 (45/03/88 8280)  Complications: No apparent anesthesia complications

## 2016-10-14 ENCOUNTER — Encounter (HOSPITAL_COMMUNITY): Payer: Self-pay | Admitting: Vascular Surgery

## 2016-10-16 ENCOUNTER — Telehealth: Payer: Self-pay | Admitting: Vascular Surgery

## 2016-10-16 LAB — POCT I-STAT 4, (NA,K, GLUC, HGB,HCT)
Glucose, Bld: 97 mg/dL (ref 65–99)
HEMATOCRIT: 35 % — AB (ref 39.0–52.0)
Hemoglobin: 11.9 g/dL — ABNORMAL LOW (ref 13.0–17.0)
Potassium: 3.9 mmol/L (ref 3.5–5.1)
Sodium: 143 mmol/L (ref 135–145)

## 2016-10-16 NOTE — Telephone Encounter (Signed)
Sched appt 11/21/16; lab at 8:00 and MD at 8:45. Spoke to pt to inform them of appt.

## 2016-10-16 NOTE — Telephone Encounter (Signed)
-----   Message from Mena Goes, RN sent at 10/13/2016 10:34 AM EST ----- Regarding: schedule   ----- Message ----- From: Ulyses Amor, PA-C Sent: 10/13/2016  10:27 AM To: Vvs Charge Pool  F/U with Dr. Donnetta Hutching in 4-5 weeks s/p right AV fistula creation needs duplex

## 2016-10-18 DIAGNOSIS — I129 Hypertensive chronic kidney disease with stage 1 through stage 4 chronic kidney disease, or unspecified chronic kidney disease: Secondary | ICD-10-CM | POA: Diagnosis not present

## 2016-10-18 DIAGNOSIS — D631 Anemia in chronic kidney disease: Secondary | ICD-10-CM | POA: Diagnosis not present

## 2016-10-18 DIAGNOSIS — N184 Chronic kidney disease, stage 4 (severe): Secondary | ICD-10-CM | POA: Diagnosis not present

## 2016-10-18 DIAGNOSIS — N2581 Secondary hyperparathyroidism of renal origin: Secondary | ICD-10-CM | POA: Diagnosis not present

## 2016-11-14 ENCOUNTER — Encounter: Payer: Self-pay | Admitting: Vascular Surgery

## 2016-11-14 DIAGNOSIS — H401133 Primary open-angle glaucoma, bilateral, severe stage: Secondary | ICD-10-CM | POA: Diagnosis not present

## 2016-11-14 DIAGNOSIS — Z961 Presence of intraocular lens: Secondary | ICD-10-CM | POA: Diagnosis not present

## 2016-11-21 ENCOUNTER — Ambulatory Visit (HOSPITAL_COMMUNITY)
Admission: RE | Admit: 2016-11-21 | Discharge: 2016-11-21 | Disposition: A | Discharge status: Home / Self Care | Payer: Commercial Managed Care - HMO | Source: Ambulatory Visit | Attending: Vascular Surgery | Admitting: Vascular Surgery

## 2016-11-21 ENCOUNTER — Ambulatory Visit (INDEPENDENT_AMBULATORY_CARE_PROVIDER_SITE_OTHER): Payer: Commercial Managed Care - HMO | Admitting: Vascular Surgery

## 2016-11-21 ENCOUNTER — Encounter: Payer: Self-pay | Admitting: Vascular Surgery

## 2016-11-21 VITALS — BP 170/89 | HR 67 | Temp 97.6°F | Resp 18 | Ht 74.0 in | Wt 216.0 lb

## 2016-11-21 DIAGNOSIS — N186 End stage renal disease: Secondary | ICD-10-CM | POA: Insufficient documentation

## 2016-11-21 DIAGNOSIS — Z4931 Encounter for adequacy testing for hemodialysis: Secondary | ICD-10-CM | POA: Insufficient documentation

## 2016-11-21 DIAGNOSIS — N184 Chronic kidney disease, stage 4 (severe): Secondary | ICD-10-CM

## 2016-11-21 NOTE — Progress Notes (Signed)
   Patient name: [name] MRN: [id number] DOB: 10/11/1941 Sex: male  REASON FOR VISIT: Follow-up for stage right basilic vein fistula from 10/13/2016  HPI: David [name] is a 75 y.o. male here for follow-up. He is not on dialysis currently. He had a solid vein transposition fistula placement Dr. Kellie Simmering in 2006 which eventually thrombosed. I'd seen him for discussion of fistula access. The cephalic vein was very small in his basilic vein was of marginal size on the right. He underwent first stage basilic vein fistula is here for follow-up  Current Outpatient Prescriptions  Medication Sig Dispense Refill  . acetaminophen (TYLENOL) 500 MG tablet Take 500 mg by mouth every 4 (four) hours as needed for moderate pain or headache.     Marland Kitchen amLODipine (NORVASC) 10 MG tablet TAKE 1 TABLET (10 MG TOTAL) BY MOUTH DAILY. 90 tablet 3  . aspirin 325 MG EC tablet Take 325 mg by mouth every evening.     . brimonidine (ALPHAGAN) 0.15 % ophthalmic solution Place 1 drop into the left eye 2 (two) times daily.   1  . carvedilol (COREG) 25 MG tablet Take 1 tablet (25 mg total) by mouth 2 (two) times daily. 180 tablet 3  . dorzolamide-timolol (COSOPT) 22.3-6.8 MG/ML ophthalmic solution Place 1 drop into the left eye 2 (two) times daily.     Marland Kitchen latanoprost (XALATAN) 0.005 % ophthalmic solution Place 1 drop into the left eye at bedtime. Use 1 drop in left eye at bedtime    . multivitamin (RENA-VIT) TABS tablet Take 1 tablet by mouth daily.  3  . oxyCODONE-acetaminophen (PERCOCET/ROXICET) 5-325 MG tablet Take 1 tablet by mouth every 6 (six) hours as needed. 6 tablet 0  . terazosin (HYTRIN) 10 MG capsule Take 10 mg by mouth at bedtime.     . TRIUMEQ 600-50-300 MG tablet TAKE 1 TABLET BY MOUTH EVERY DAY 30 tablet 4  . Vitamin D, Ergocalciferol, (DRISDOL) 50000 UNITS CAPS capsule TK ONE C PO Q  WEEK  2   No current facility-administered medications for this visit.      PHYSICAL  EXAM: Vitals:   11/21/16 0837 11/21/16 0838  BP: (!) 179/71 (!) 170/89  Pulse: 67   Resp: 18   Temp: 97.6 F (36.4 C)   TempSrc: Oral   SpO2: 100%   Weight: 216 lb (98 kg)   Height: 6\' 2"  (1.88 m)     GENERAL: The patient is a well-nourished male, in no acute distress. The vital signs are documented above. 2+ radial pulse bilaterally. Right antecubital incision is completely healed. I do not have sensation of a thrill or bruit I imaged this area with SonoSite ultrasound and there is no flow in the basilic fistula  MEDICAL ISSUES: Failed basilic fistula. Patient reports that he is stable from a renal standpoint. His next option will be Gore-Tex graft in his left arm. Will defer this until he is approaching need for hemodialysis.   [name], MD FACS [facility] Tel [phone] Pager [phone]

## 2016-12-05 ENCOUNTER — Other Ambulatory Visit: Payer: Commercial Managed Care - HMO

## 2016-12-05 ENCOUNTER — Ambulatory Visit: Payer: Commercial Managed Care - HMO

## 2016-12-05 ENCOUNTER — Other Ambulatory Visit (HOSPITAL_COMMUNITY)
Admission: RE | Admit: 2016-12-05 | Discharge: 2016-12-05 | Disposition: A | Discharge status: Home / Self Care | Payer: Commercial Managed Care - HMO | Source: Ambulatory Visit | Attending: Infectious Disease | Admitting: Infectious Disease

## 2016-12-05 DIAGNOSIS — B20 Human immunodeficiency virus [HIV] disease: Secondary | ICD-10-CM | POA: Diagnosis not present

## 2016-12-05 DIAGNOSIS — Z113 Encounter for screening for infections with a predominantly sexual mode of transmission: Secondary | ICD-10-CM

## 2016-12-05 DIAGNOSIS — Z79899 Other long term (current) drug therapy: Secondary | ICD-10-CM | POA: Diagnosis not present

## 2016-12-05 LAB — CBC WITH DIFFERENTIAL/PLATELET
BASOS ABS: 58 {cells}/uL (ref 0–200)
BASOS PCT: 1 %
EOS ABS: 406 {cells}/uL (ref 15–500)
EOS PCT: 7 %
HCT: 37.3 % — ABNORMAL LOW (ref 38.5–50.0)
Hemoglobin: 12.2 g/dL — ABNORMAL LOW (ref 13.2–17.1)
LYMPHS ABS: 1450 {cells}/uL (ref 850–3900)
Lymphocytes Relative: 25 %
MCH: 33.6 pg — AB (ref 27.0–33.0)
MCHC: 32.7 g/dL (ref 32.0–36.0)
MCV: 102.8 fL — AB (ref 80.0–100.0)
MPV: 10 fL (ref 7.5–12.5)
Monocytes Absolute: 522 cells/uL (ref 200–950)
Monocytes Relative: 9 %
NEUTROS ABS: 3364 {cells}/uL (ref 1500–7800)
Neutrophils Relative %: 58 %
PLATELETS: 224 10*3/uL (ref 140–400)
RBC: 3.63 MIL/uL — ABNORMAL LOW (ref 4.20–5.80)
RDW: 15.8 % — ABNORMAL HIGH (ref 11.0–15.0)
WBC: 5.8 10*3/uL (ref 3.8–10.8)

## 2016-12-06 LAB — RPR: RPR Ser Ql: REACTIVE — AB

## 2016-12-06 LAB — T-HELPER CELL (CD4) - (RCID CLINIC ONLY)
CD4 T CELL ABS: 350 /uL — AB (ref 400–2700)
CD4 T CELL HELPER: 23 % — AB (ref 33–55)

## 2016-12-06 LAB — COMPLETE METABOLIC PANEL WITH GFR
ALBUMIN: 4 g/dL (ref 3.6–5.1)
ALK PHOS: 91 U/L (ref 40–115)
ALT: 29 U/L (ref 9–46)
AST: 24 U/L (ref 10–35)
BILIRUBIN TOTAL: 0.3 mg/dL (ref 0.2–1.2)
BUN: 42 mg/dL — ABNORMAL HIGH (ref 7–25)
CO2: 28 mmol/L (ref 20–31)
CREATININE: 3.41 mg/dL — AB (ref 0.70–1.18)
Calcium: 8.9 mg/dL (ref 8.6–10.3)
Chloride: 107 mmol/L (ref 98–110)
GFR, EST NON AFRICAN AMERICAN: 17 mL/min — AB (ref >=60)
GFR, Est African American: 19 mL/min — ABNORMAL LOW (ref >=60)
GLUCOSE: 117 mg/dL — AB (ref 65–99)
Potassium: 4.5 mmol/L (ref 3.5–5.3)
SODIUM: 142 mmol/L (ref 135–146)
TOTAL PROTEIN: 7.2 g/dL (ref 6.1–8.1)

## 2016-12-06 LAB — LIPID PANEL
Cholesterol: 171 mg/dL (ref <200)
HDL: 50 mg/dL (ref >40)
LDL CALC: 103 mg/dL — AB (ref <100)
TRIGLYCERIDES: 89 mg/dL (ref <150)
Total CHOL/HDL Ratio: 3.4 Ratio (ref <5.0)
VLDL: 18 mg/dL (ref <30)

## 2016-12-06 LAB — URINE CYTOLOGY ANCILLARY ONLY
CHLAMYDIA, DNA PROBE: NEGATIVE
Neisseria Gonorrhea: NEGATIVE

## 2016-12-06 LAB — FLUORESCENT TREPONEMAL AB(FTA)-IGG-BLD: Fluorescent Treponemal ABS: REACTIVE — AB

## 2016-12-06 LAB — RPR TITER: RPR Titer: 1:2 {titer}

## 2016-12-07 LAB — HIV-1 RNA QUANT-NO REFLEX-BLD
HIV 1 RNA Quant: 53 copies/mL — ABNORMAL HIGH (ref <20)
HIV-1 RNA Quant, Log: 1.72 Log copies/mL — ABNORMAL HIGH (ref <1.30)

## 2016-12-18 ENCOUNTER — Ambulatory Visit (INDEPENDENT_AMBULATORY_CARE_PROVIDER_SITE_OTHER): Payer: Medicare HMO | Admitting: Infectious Disease

## 2016-12-18 ENCOUNTER — Encounter: Payer: Self-pay | Admitting: Infectious Disease

## 2016-12-18 VITALS — BP 151/77 | HR 72 | Temp 97.6°F | Ht 74.0 in | Wt 224.0 lb

## 2016-12-18 DIAGNOSIS — A529 Late syphilis, unspecified: Secondary | ICD-10-CM

## 2016-12-18 DIAGNOSIS — B20 Human immunodeficiency virus [HIV] disease: Secondary | ICD-10-CM | POA: Diagnosis not present

## 2016-12-18 DIAGNOSIS — Z113 Encounter for screening for infections with a predominantly sexual mode of transmission: Secondary | ICD-10-CM | POA: Diagnosis not present

## 2016-12-18 DIAGNOSIS — I129 Hypertensive chronic kidney disease with stage 1 through stage 4 chronic kidney disease, or unspecified chronic kidney disease: Secondary | ICD-10-CM | POA: Diagnosis not present

## 2016-12-18 NOTE — Progress Notes (Signed)
Chief complaint: followup for HIV, CKD  Subjective:    Patient ID: David [name], male    DOB: [birth year], 76 y.o.   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, prior syphilis and now acute on chronic kidney disease,  in whom we consolidated to [facility] and who has mained nice virological suppression and CD4 that has risen above 300      Lab Results  Component Value Date   HIV1RNAQUANT 53 (H) 12/05/2016   HIV1RNAQUANT 100 (H) 06/12/2016   HIV1RNAQUANT <20 02/16/2016      Lab Results  Component Value Date   CD4TABS 350 (L) 12/05/2016   CD4TABS 170 (L) 06/12/2016   CD4TABS 200 (L) 02/15/2016   He again had questions about risk of transmitting HIV to his wife I again reference to large studies including HPT N0 52 in the partner study that if shown no transmission between sero-discordant couples if the infected partner is on antiretrovirals consistently and with a viral load below 200.  I also offered to have [place] prescribed Truvada for his wife if this would help provide further protection.  Past Medical History:  Diagnosis Date  . Anemia, iron deficiency 11/15/2011  . Arthritis   . Cancer [facility])    hx of prostate; s/p radioactive seed implant 10/2009 Dr Janice Norrie  . Chronic kidney disease   . Glaucoma   . Heart murmur    "mild" per pt  . History of cardiac cath 2004   negative for CAD  . History of myocardial perfusion scan 02/2010   negative for coronary insufficiency (LVEF 27%)  . HIV infection ([place])   . Hyperlipidemia   . Hypertension    followed by [facility] and Vascular (Dr Dani Gobble Croitoru)  . Nonischemic cardiomyopathy ([place])   . Sinus bradycardia   . Stroke [facility])    "mini stroke" years ago  . Systolic and diastolic CHF, acute ([place]) 06/2010   felt to be secondary to hypertensive cardiomyopathy    Past Surgical History:  Procedure Laterality Date  . AV FISTULA PLACEMENT Right 10/13/2016   Procedure: ARTERIOVENOUS  (AV) FISTULA CREATION;  Surgeon: [name], MD;  Location: [place];  Service: Vascular;  Laterality: Right;  . BASCILIC VEIN TRANSPOSITION Left 06/24/2015   Procedure: BASILIC VEIN TRANSPOSITION;  Surgeon: Mal Misty, MD;  Location: [place];  Service: Vascular;  Laterality: Left;  . CARDIAC CATHETERIZATION  02/04/2003   mildly depressed LV systolic fx EF 41%,LKGMWN coronaries/abdominal aorta/renal arteries.  . COLONOSCOPY    . ESOPHAGOGASTRODUODENOSCOPY (EGD) WITH PROPOFOL N/A 05/05/2014   Procedure: ESOPHAGOGASTRODUODENOSCOPY (EGD) WITH PROPOFOL;  Surgeon: Missy Sabins, MD;  [facility];  Service: Endoscopy;  Laterality: N/A;  . EYE SURGERY Bilateral    cataract surgery   . EYE SURGERY Bilateral    glaucoma surgery  . NM MYOCAR PERF WALL MOTION  02/21/2010   normal  . RADIOACTIVE SEED IMPLANT  2010   prostate cancer  . US ECHOCARDIOGRAPHY  02/06/2012   mild LVH,LA mod. dilated,mild-mod. 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 AI.    Family History  Problem Relation Age of Onset  . Hypertension Mother   . Thyroid disease Mother   . Cholelithiasis Daughter   . Cholelithiasis Son   . Hypertension Maternal Grandmother   . Diabetes Maternal Grandmother   . Heart attack Neg Hx   . Hyperlipidemia Neg Hx  Social History   Social History  . Marital status: Married    Spouse name: N/A  . Number of children: 2  . Years of education: N/A   Occupational History  . retired Retired   Social History Main Topics  . Smoking status: Former Smoker    Quit date: 12/04/2005  . Smokeless tobacco: Never Used  . Alcohol use Yes     Comment: once a week-wine  . Drug use: No  . Sexual activity: Not on file   Other Topics Concern  . Not on file   Social History Narrative   Stays active at home   Regular exercise: no    Allergies  Allergen Reactions  . Codeine Other (See Comments)    Causes constipation.   Blanchard Kelch [Losartan Potassium] Other  (See Comments)    constipation     Current Outpatient Prescriptions:  .  acetaminophen (TYLENOL) 500 MG tablet, Take 500 mg by mouth every 4 (four) hours as needed for moderate pain or headache. , Disp: , Rfl:  .  amLODipine (NORVASC) 10 MG tablet, TAKE 1 TABLET (10 MG TOTAL) BY MOUTH DAILY., Disp: 90 tablet, Rfl: 3 .  aspirin 325 MG EC tablet, Take 325 mg by mouth every evening. , Disp: , Rfl:  .  brimonidine (ALPHAGAN) 0.15 % ophthalmic solution, Place 1 drop into the left eye 2 (two) times daily. , Disp: , Rfl: 1 .  carvedilol (COREG) 25 MG tablet, Take 1 tablet (25 mg total) by mouth 2 (two) times daily., Disp: 180 tablet, Rfl: 3 .  dorzolamide-timolol (COSOPT) 22.3-6.8 MG/ML ophthalmic solution, Place 1 drop into the left eye 2 (two) times daily. , Disp: , Rfl:  .  latanoprost (XALATAN) 0.005 % ophthalmic solution, Place 1 drop into the left eye at bedtime. Use 1 drop in left eye at bedtime, Disp: , Rfl:  .  multivitamin (RENA-VIT) TABS tablet, Take 1 tablet by mouth daily., Disp: , Rfl: 3 .  oxyCODONE-acetaminophen (PERCOCET/ROXICET) 5-325 MG tablet, Take 1 tablet by mouth every 6 (six) hours as needed., Disp: 6 tablet, Rfl: 0 .  terazosin (HYTRIN) 10 MG capsule, Take 10 mg by mouth at bedtime. , Disp: , Rfl:  .  TRIUMEQ 600-50-300 MG tablet, TAKE 1 TABLET BY MOUTH EVERY DAY, Disp: 30 tablet, Rfl: 4 .  Vitamin D, Ergocalciferol, (DRISDOL) 50000 UNITS CAPS capsule, TK ONE C PO Q  WEEK, Disp: , Rfl: 2   Review of Systems  Constitutional: Negative for activity change, appetite change, chills, diaphoresis, fatigue, fever and unexpected weight change.  HENT: Negative for congestion, rhinorrhea, sinus pressure, sneezing, sore throat and trouble swallowing.   Respiratory: Negative for cough, chest tightness and shortness of breath.   Cardiovascular: Negative for chest pain, palpitations and leg swelling.  Gastrointestinal: Negative for abdominal distention, abdominal pain, constipation,  nausea and vomiting.  Genitourinary: Negative for hematuria.  Musculoskeletal: Negative for myalgias.  Skin: Negative for color change, pallor, rash and wound.  Neurological: Negative for dizziness, tremors, weakness and light-headedness.  Hematological: Negative for adenopathy. Does not bruise/bleed easily.  Psychiatric/Behavioral: Negative for agitation, behavioral problems, confusion, decreased concentration and sleep disturbance.       Objective:   Physical Exam  Constitutional: He is oriented to person, place, and time. He appears well-developed and well-nourished. No distress.  HENT:  Head: Normocephalic and atraumatic.  Mouth/Throat: Oropharynx is clear and moist. No oropharyngeal exudate.  Eyes: Conjunctivae and EOM are normal. Pupils are equal, round, and reactive to light.  No scleral icterus.  Neck: Normal range of motion. Neck supple. No JVD present.  Cardiovascular: Normal rate, regular rhythm and normal heart sounds.  Exam reveals no gallop and no friction rub.   No murmur heard. Pulmonary/Chest: Effort normal and breath sounds normal. No respiratory distress. He has no wheezes. He has no rales. He exhibits no tenderness.  Abdominal: He exhibits no distension and no mass. There is no tenderness. There is no rebound and no guarding.  Musculoskeletal: He exhibits no edema or tenderness.  Lymphadenopathy:    He has no cervical adenopathy.  Neurological: He is alert and oriented to person, place, and time. He has normal reflexes. He exhibits normal muscle tone. Coordination normal.  Skin: Skin is warm and dry. He is not diaphoretic. No erythema. No pallor.  Psychiatric: He has a normal mood and affect. His behavior is normal. Judgment and thought content normal.          Assessment & Plan:   HIV/AIDS: continue [place], and recheck labs in July and renew SPAP today and in 6 months months.   Syphilis:titers down  CKD: followed by Dr Posey Pronto  HTN: followup with PCP.    There were no vitals filed for this visit.   I spent greater than 25 minutes with the patient including greater than 50% of time in face to face counsel of the patient re his HIV, syphilis, his CKD, his HTN and in coordination of his care.

## 2017-01-02 ENCOUNTER — Ambulatory Visit (INDEPENDENT_AMBULATORY_CARE_PROVIDER_SITE_OTHER): Payer: Medicare HMO | Admitting: Cardiovascular Disease

## 2017-01-02 ENCOUNTER — Encounter: Payer: Self-pay | Admitting: Cardiovascular Disease

## 2017-01-02 VITALS — BP 132/70 | HR 72 | Ht 73.0 in | Wt 222.2 lb

## 2017-01-02 DIAGNOSIS — E7800 Pure hypercholesterolemia, unspecified: Secondary | ICD-10-CM | POA: Diagnosis not present

## 2017-01-02 DIAGNOSIS — I5030 Unspecified diastolic (congestive) heart failure: Secondary | ICD-10-CM

## 2017-01-02 DIAGNOSIS — N184 Chronic kidney disease, stage 4 (severe): Secondary | ICD-10-CM | POA: Diagnosis not present

## 2017-01-02 DIAGNOSIS — R072 Precordial pain: Secondary | ICD-10-CM

## 2017-01-02 DIAGNOSIS — I10 Essential (primary) hypertension: Secondary | ICD-10-CM

## 2017-01-02 NOTE — Patient Instructions (Signed)
Dr Croitoru recommends that you schedule a follow-up appointment in 1 year. You will receive a reminder letter in the mail two months in advance. If you don't receive a letter, please call our office to schedule the follow-up appointment.  If you need a refill on your cardiac medications before your next appointment, please call your pharmacy. 

## 2017-01-02 NOTE — Progress Notes (Signed)
Patient ID: David [name], male   DOB: 1941-10-27, 76 y.o.   MRN: [id number]    Cardiology Office Note    Date:  01/02/2017   ID:  David [name], DOB [birth year], MRN [id number]  PCP:  David Homans, MD  Cardiologist:   David Klein, MD   Chief Complaint  Patient presents with  . Follow-up    pt reports no complaints    History of Present Illness:  David [name] is a 76 y.o. male Hypertension, nonischemic dilated cardiomyopathy and, chronic kidney disease  David [name] has a long-standing history of nonischemic cardiomyopathy (normal coronary angio 2004) with an ejection fraction that was as low as 30% in the past, but on therapy increased to 45-50%. He has symptoms of NYHA class I congestive heart failure at this time.   Several months ago he recalls having an episode of chest discomfort while sitting at the side of the bed. It had a burning sensation. It was not associated with physical activity and resolve spontaneously. He thinks it might have been "heartburn". He has not had any complaints of that nature since, despite the fact that he picks up scrap metal for recycling, sometimes performing relatively strenuous activity.  The patient specifically denies any chest pain with exertion, dyspnea at rest or with exertion, orthopnea, paroxysmal nocturnal dyspnea, syncope, palpitations, focal neurological deficits, intermittent claudication, lower extremity edema, unexplained weight gain, fever, cough, hemoptysis or wheezing.  His blood pressure has been well controlled on high-dose carvedilol and amlodipine. Not on diuretics or RAAS inhibitors due advanced CKD, now requiring Epo (David [name]). Has a left arm AV fistula placed in July 2016 (David [name]), not yet on dialysis. Unfortunately the fistula seems to be malfunctioning.  During evaluation for unexplained weight loss who was found to have a new diagnosis of HIV/AIDS with a CD4 count of only 60. After initiation of antiretroviral  therapyhe gained back weight he had lost  (David [name]).  Past Medical History:  Diagnosis Date  . Anemia, iron deficiency 11/15/2011  . Arthritis   . Cancer [facility])    hx of prostate; s/p radioactive seed implant 10/2009 David Janice [name]  . Chronic kidney disease   . Glaucoma   . Heart murmur    "mild" per pt  . History of cardiac cath 2004   negative for CAD  . History of myocardial perfusion scan 02/2010   negative for coronary insufficiency (LVEF 27%)  . HIV infection ([place])   . Hyperlipidemia   . Hypertension    followed by [facility] and Vascular (David [name])  . Nonischemic cardiomyopathy ([place])   . Sinus bradycardia   . Stroke [facility])    "mini stroke" years ago  . Systolic and diastolic CHF, acute ([place]) 06/2010   felt to be secondary to hypertensive cardiomyopathy    Past Surgical History:  Procedure Laterality Date  . AV FISTULA PLACEMENT Right 10/13/2016   Procedure: ARTERIOVENOUS (AV) FISTULA CREATION;  Surgeon: David Posner, MD;  Location: [place];  Service: Vascular;  Laterality: Right;  . BASCILIC VEIN TRANSPOSITION Left 06/24/2015   Procedure: BASILIC VEIN TRANSPOSITION;  Surgeon: David Misty, MD;  Location: [place];  Service: Vascular;  Laterality: Left;  . CARDIAC CATHETERIZATION  02/04/2003   mildly depressed LV systolic fx EF 52%,WUXLKG coronaries/abdominal aorta/renal arteries.  . COLONOSCOPY    . ESOPHAGOGASTRODUODENOSCOPY (EGD) WITH PROPOFOL N/A 05/05/2014   Procedure: ESOPHAGOGASTRODUODENOSCOPY (EGD) WITH PROPOFOL;  Surgeon: David Sabins, MD;  Location[facility];  Service: Endoscopy;  Laterality: N/A;  . EYE SURGERY Bilateral    cataract surgery   . EYE SURGERY Bilateral    glaucoma surgery  . NM MYOCAR PERF WALL MOTION  02/21/2010   normal  . RADIOACTIVE SEED IMPLANT  2010   prostate cancer  . US ECHOCARDIOGRAPHY  02/06/2012   mild LVH,LA mod. dilated,mild-mod. 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 AI.    Current  Outpatient Prescriptions  Medication Sig Dispense Refill  . acetaminophen (TYLENOL) 500 MG tablet Take 500 mg by mouth every 4 (four) hours as needed for moderate pain or headache.     Marland Kitchen amLODipine (NORVASC) 10 MG tablet TAKE 1 TABLET (10 MG TOTAL) BY MOUTH DAILY. 90 tablet 3  . aspirin 325 MG EC tablet Take 325 mg by mouth every evening.     . brimonidine (ALPHAGAN) 0.15 % ophthalmic solution Place 1 drop into the left eye 2 (two) times daily.   1  . carvedilol (COREG) 25 MG tablet Take 1 tablet (25 mg total) by mouth 2 (two) times daily. 180 tablet 3  . dorzolamide-timolol (COSOPT) 22.3-6.8 MG/ML ophthalmic solution Place 1 drop into the left eye 2 (two) times daily.     Marland Kitchen latanoprost (XALATAN) 0.005 % ophthalmic solution Place 1 drop into the left eye at bedtime. Use 1 drop in left eye at bedtime    . multivitamin (RENA-VIT) TABS tablet Take 1 tablet by mouth daily.  3  . oxyCODONE-acetaminophen (PERCOCET/ROXICET) 5-325 MG tablet Take 1 tablet by mouth every 6 (six) hours as needed. 6 tablet 0  . terazosin (HYTRIN) 10 MG capsule Take 10 mg by mouth at bedtime.     . TRIUMEQ 600-50-300 MG tablet TAKE 1 TABLET BY MOUTH EVERY DAY 30 tablet 4  . Vitamin D, Ergocalciferol, (DRISDOL) 50000 UNITS CAPS capsule TK ONE C PO Q  WEEK  2   No current facility-administered medications for this visit.     Allergies:   Codeine and Cozaar [losartan potassium]   Social History   Social History  . Marital status: Married    Spouse name: N/A  . Number of children: 2  . Years of education: N/A   Occupational History  . retired Retired   Social History Main Topics  . Smoking status: Former Smoker    Quit date: 12/04/2005  . Smokeless tobacco: Never Used  . Alcohol use Yes     Comment: once a week-wine  . Drug use: No  . Sexual activity: Not on file   Other Topics Concern  . Not on file   Social History Narrative   Stays active at home   Regular exercise: no     Family History:  The  patient's family history includes Cholelithiasis in his daughter and son; Diabetes in his maternal grandmother; Hypertension in his maternal grandmother and mother; Thyroid disease in his mother.   ROS:   Please see the history of present illness.    Review of Systems  All other systems reviewed and are negative.    PHYSICAL EXAM:   VS:  BP 132/70   Pulse 72   Ht 6\' 1"  (1.854 m)   Wt 100.8 kg (222 lb 3.2 oz)   BMI 29.32 kg/m    GEN: Well nourished, well developed, in no acute distress HEENT: normal Neck: no JVD, carotid bruits, or masses Cardiac: RRR; no murmurs or rubs; +S4 gallop, no edema ; large left arm AV fistula with weak  bruit Respiratory:  A few crackles in the right base, otherwise clear to auscultation bilaterally, normal work of breathing GI: soft, nontender, nondistended, + BS MS: no deformity or atrophy Skin: warm and dry, no rash Neuro:  Alert and Oriented x 3, Strength and sensation are intact Psych: euthymic mood, full affect  Wt Readings from Last 3 Encounters:  01/02/17 100.8 kg (222 lb 3.2 oz)  12/18/16 101.6 kg (224 lb)  11/21/16 98 kg (216 lb)      Studies/Labs Reviewed:   EKG:  EKG is ordered today.  The ekg ordered today demonstrates normal sinus rhythm with first-degree A-V block (PR 228 ms)And a single PVC, left axis deviation, left ventricular hypertrophy, QRS 128 ms, QTC 464 ms.  Recent Labs: 12/05/2016: ALT 29; BUN 42; Creat 3.41; Hemoglobin 12.2; Platelets 224; Potassium 4.5; Sodium 142   Lipid Panel    Component Value Date/Time   CHOL 171 12/05/2016 1105   TRIG 89 12/05/2016 1105   HDL 50 12/05/2016 1105   CHOLHDL 3.4 12/05/2016 1105   VLDL 18 12/05/2016 1105   LDLCALC 103 (H) 12/05/2016 1105     ASSESSMENT:    1. Diastolic heart failure, NYHA class 2 ([place])   2. Essential hypertension   3. CKD (chronic kidney disease) stage 4, GFR 15-29 ml/min (HCC)   4. Pure hypercholesterolemia   5. Precordial pain      PLAN:  In order of  problems listed above:  1. CHF: Predominantly diastolic heart failure, NYHA class I, appears to be euvolemic. Not on ACEi/ARB due to advanced renal disease, approaching ESRD.  2. HTN: well controlled - probably primary etiology for his cardiomyopathy 3. CKD: Approaching ESRD, already has HD access 4. HLP: Satisfactory recent lipid profile, despite protease inhibitors 5. Chest pain: The cause of his episode of chest pain is uncertain, but there were no clear hallmarks of ischemic heart disease. He has no symptoms with physical activity. Discussed how he might be able to distinguish acid reflux from angina, but asked him to seek emergency attention if he has severe chest discomfort that persists for more than 30 minutes and is not relieved by changes in position or antacids.   Medication Adjustments/Labs and Tests Ordered: Current medicines are reviewed at length with the patient today.  Concerns regarding medicines are outlined above.  Medication changes, Labs and Tests ordered today are listed below. Patient Instructions  David Sallyanne Kuster recommends that you schedule a follow-up appointment in 1 year. You will receive a reminder letter in the mail two months in advance. If you don't receive a letter, please call our office to schedule the follow-up appointment.  If you need a refill on your cardiac medications before your next appointment, please call your pharmacy.     Signed, David Klein, MD  01/02/2017 9:37 AM    [facility] [address], [place], [place]  [address] Phone: [phone]; Fax: [phone]

## 2017-01-02 NOTE — Progress Notes (Signed)
Thanks for taking care of him 

## 2017-01-08 ENCOUNTER — Other Ambulatory Visit: Payer: Self-pay | Admitting: Cardiovascular Disease

## 2017-01-09 NOTE — Telephone Encounter (Signed)
Rx(s) sent to pharmacy electronically.  

## 2017-01-10 ENCOUNTER — Ambulatory Visit: Payer: Medicare HMO | Admitting: *Deleted

## 2017-01-10 NOTE — Progress Notes (Deleted)
Pre visit review using our clinic review tool, if applicable. No additional management support is needed unless otherwise documented below in the visit note. 

## 2017-01-10 NOTE — Progress Notes (Deleted)
Subjective:   David [name] is a 76 y.o. male who presents for Medicare Annual/Subsequent preventive examination.  Review of Systems:  No ROS.  Medicare Wellness Visit.     Sleep patterns: {SX; SLEEP PATTERNS:18802::"feels rested on waking","does not get up to void","gets up *** times nightly to void","sleeps *** hours nightly"}.   Home Safety/Smoke Alarms:   Living environment; residence and Firearm Safety: {Rehab home environment / accessibility:30080::"no firearms","firearms stored safely"}. Seat Belt Safety/Bike Helmet: Wears seat belt.   Counseling:   Eye Exam-  Dental-  Male:   Pap-       Mammo-       Dexa scan-        CCS-  Male:   CCS- last 01/25/2011 w/ Dr. Silvano Rusk. 3 polyps removed, otherwise normal. No follow-up recommended per letter sent to patient 04/04/2016.   PSA-  Lab Results  Component Value Date   PSA <0.01 (L) 05/13/2014   PSA 0.01 08/16/2011      Objective:    Vitals: There were no vitals taken for this visit.  There is no height or weight on file to calculate BMI.  Tobacco History  Smoking Status  . Former Smoker  . Quit date: 12/04/2005  Smokeless Tobacco  . Never Used     Counseling given: Not Answered   Past Medical History:  Diagnosis Date  . Anemia, iron deficiency 11/15/2011  . Arthritis   . Cancer [facility])    hx of prostate; s/p radioactive seed implant 10/2009 Dr Janice Norrie  . Chronic kidney disease   . Glaucoma   . Heart murmur    "mild" per pt  . History of cardiac cath 2004   negative for CAD  . History of myocardial perfusion scan 02/2010   negative for coronary insufficiency (LVEF 27%)  . HIV infection ([place])   . Hyperlipidemia   . Hypertension    followed by [facility] and Vascular (Dr Dani Gobble Croitoru)  . Nonischemic cardiomyopathy ([place])   . Sinus bradycardia   . Stroke [facility])    "mini stroke" years ago  . Systolic and diastolic CHF, acute ([place]) 06/2010   felt to be secondary to hypertensive cardiomyopathy    Past Surgical History:  Procedure Laterality Date  . AV FISTULA PLACEMENT Right 10/13/2016   Procedure: ARTERIOVENOUS (AV) FISTULA CREATION;  Surgeon: [name], MD;  Location: [place];  Service: Vascular;  Laterality: Right;  . BASCILIC VEIN TRANSPOSITION Left 06/24/2015   Procedure: BASILIC VEIN TRANSPOSITION;  Surgeon: Mal Misty, MD;  Location: [place];  Service: Vascular;  Laterality: Left;  . CARDIAC CATHETERIZATION  02/04/2003   mildly depressed LV systolic fx EF 16%,XWRUEA coronaries/abdominal aorta/renal arteries.  . COLONOSCOPY    . ESOPHAGOGASTRODUODENOSCOPY (EGD) WITH PROPOFOL N/A 05/05/2014   Procedure: ESOPHAGOGASTRODUODENOSCOPY (EGD) WITH PROPOFOL;  Surgeon: Missy Sabins, MD;  [facility];  Service: Endoscopy;  Laterality: N/A;  . EYE SURGERY Bilateral    cataract surgery   . EYE SURGERY Bilateral    glaucoma surgery  . NM MYOCAR PERF WALL MOTION  02/21/2010   normal  . RADIOACTIVE SEED IMPLANT  2010   prostate cancer  . US ECHOCARDIOGRAPHY  02/06/2012   mild LVH,LA mod. dilated,mild-mod. 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 AI.   Family History  Problem Relation Age of Onset  . Hypertension Mother   . Thyroid disease Mother   . Cholelithiasis Daughter   . Cholelithiasis Son   . Hypertension Maternal  Grandmother   . Diabetes Maternal Grandmother   . Heart attack Neg Hx   . Hyperlipidemia Neg Hx    History  Sexual Activity  . Sexual activity: Not on file    Outpatient Encounter Prescriptions as of 01/10/2017  Medication Sig  . acetaminophen (TYLENOL) 500 MG tablet Take 500 mg by mouth every 4 (four) hours as needed for moderate pain or headache.   Marland Kitchen amLODipine (NORVASC) 10 MG tablet TAKE 1 TABLET EVERY DAY  . aspirin 325 MG EC tablet Take 325 mg by mouth every evening.   . brimonidine (ALPHAGAN) 0.15 % ophthalmic solution Place 1 drop into the left eye 2 (two) times daily.   . carvedilol (COREG) 25 MG tablet TAKE 1 TABLET  TWICE DAILY  . dorzolamide-timolol (COSOPT) 22.3-6.8 MG/ML ophthalmic solution Place 1 drop into the left eye 2 (two) times daily.   Marland Kitchen latanoprost (XALATAN) 0.005 % ophthalmic solution Place 1 drop into the left eye at bedtime. Use 1 drop in left eye at bedtime  . multivitamin (RENA-VIT) TABS tablet Take 1 tablet by mouth daily.  Marland Kitchen oxyCODONE-acetaminophen (PERCOCET/ROXICET) 5-325 MG tablet Take 1 tablet by mouth every 6 (six) hours as needed.  . terazosin (HYTRIN) 10 MG capsule Take 10 mg by mouth at bedtime.   . TRIUMEQ 600-50-300 MG tablet TAKE 1 TABLET BY MOUTH EVERY DAY  . Vitamin D, Ergocalciferol, (DRISDOL) 50000 UNITS CAPS capsule TK ONE C PO Q  WEEK   No facility-administered encounter medications on file as of 01/10/2017.     Activities of Daily Living In your present state of health, do you have any difficulty performing the following activities: 10/13/2016 02/29/2016  Hearing? N N  Vision? Y N  Difficulty concentrating or making decisions? N N  Walking or climbing stairs? N N  Dressing or bathing? N N  Some recent data might be hidden    Patient Care Team: Mosie Lukes, MD as PCP - General (Family Medicine) Truman Hayward, MD as PCP - Infectious Diseases (Infectious Diseases) Clent Jacks, MD (Ophthalmology) [name], MD (Cardiology) Elmarie Shiley, MD as Consulting Physician (Nephrology)   Assessment:    Physical assessment deferred to PCP.  Exercise Activities and Dietary recommendations    Diet (meal preparation, eat out, water intake, caffeinated beverages, dairy products, fruits and vegetables): {Desc; diets:16563} Breakfast: Lunch:  Dinner:      Goals    None     Fall Risk Fall Risk  06/26/2016 03/01/2016 12/07/2015 05/26/2015 12/30/2014  Falls in the past year? No No No No No   Depression Screen PHQ 2/9 Scores 06/26/2016 03/01/2016 12/07/2015 05/26/2015  PHQ - 2 Score 0 0 0 0    Cognitive Function        Immunization History  Administered Date(s)  Administered  . Hepatitis A, Adult 05/20/2014, 06/26/2016  . Hepatitis B, adult 05/20/2014, 06/26/2016  . Influenza Split 08/23/2011, 10/04/2012  . Influenza Whole 10/04/2010  . Influenza, High Dose Seasonal PF 09/26/2013, 09/24/2015  . Influenza,inj,Quad PF,36+ Mos 07/29/2014  . Pneumococcal Polysaccharide-23 12/26/2010, 06/26/2016  . Td 12/26/2010  . Zoster 08/23/2011   Screening Tests Health Maintenance  Topic Date Due  . INFLUENZA VACCINE  07/04/2016  . PNA vac Low Risk Adult (2 of 2 - PCV13) 06/26/2017  . TETANUS/TDAP  12/26/2020  . COLONOSCOPY  01/25/2021  . ZOSTAVAX  Completed      Plan:    During the course of the visit the patient was educated and counseled about the  following appropriate screening and preventive services:   Vaccines to include Pneumoccal, Influenza, Hepatitis B, Td, Zostavax, HCV  Cardiovascular Disease  Colorectal cancer screening  Diabetes screening  Prostate Cancer Screening  Glaucoma screening  Nutrition counseling   Smoking cessation counseling  Patient Instructions (the written plan) was given to the patient.    Dorrene German, RN  01/10/2017

## 2017-01-19 ENCOUNTER — Other Ambulatory Visit: Payer: Self-pay | Admitting: Infectious Disease

## 2017-01-19 DIAGNOSIS — B20 Human immunodeficiency virus [HIV] disease: Secondary | ICD-10-CM

## 2017-02-12 ENCOUNTER — Encounter: Payer: Self-pay | Admitting: Infectious Disease

## 2017-02-20 DIAGNOSIS — N2581 Secondary hyperparathyroidism of renal origin: Secondary | ICD-10-CM | POA: Diagnosis not present

## 2017-02-20 DIAGNOSIS — N184 Chronic kidney disease, stage 4 (severe): Secondary | ICD-10-CM | POA: Diagnosis not present

## 2017-02-20 DIAGNOSIS — N189 Chronic kidney disease, unspecified: Secondary | ICD-10-CM | POA: Diagnosis not present

## 2017-02-28 DIAGNOSIS — N184 Chronic kidney disease, stage 4 (severe): Secondary | ICD-10-CM | POA: Diagnosis not present

## 2017-02-28 DIAGNOSIS — Z6829 Body mass index (BMI) 29.0-29.9, adult: Secondary | ICD-10-CM | POA: Diagnosis not present

## 2017-02-28 DIAGNOSIS — D631 Anemia in chronic kidney disease: Secondary | ICD-10-CM | POA: Diagnosis not present

## 2017-02-28 DIAGNOSIS — I129 Hypertensive chronic kidney disease with stage 1 through stage 4 chronic kidney disease, or unspecified chronic kidney disease: Secondary | ICD-10-CM | POA: Diagnosis not present

## 2017-02-28 DIAGNOSIS — N2581 Secondary hyperparathyroidism of renal origin: Secondary | ICD-10-CM | POA: Diagnosis not present

## 2017-02-28 DIAGNOSIS — M25511 Pain in right shoulder: Secondary | ICD-10-CM | POA: Diagnosis not present

## 2017-03-20 DIAGNOSIS — Z961 Presence of intraocular lens: Secondary | ICD-10-CM | POA: Diagnosis not present

## 2017-03-20 DIAGNOSIS — H401133 Primary open-angle glaucoma, bilateral, severe stage: Secondary | ICD-10-CM | POA: Diagnosis not present

## 2017-04-10 DIAGNOSIS — H401133 Primary open-angle glaucoma, bilateral, severe stage: Secondary | ICD-10-CM | POA: Diagnosis not present

## 2017-04-10 DIAGNOSIS — Z961 Presence of intraocular lens: Secondary | ICD-10-CM | POA: Diagnosis not present

## 2017-05-22 ENCOUNTER — Other Ambulatory Visit: Payer: Medicare HMO

## 2017-05-22 ENCOUNTER — Other Ambulatory Visit (HOSPITAL_COMMUNITY)
Admission: RE | Admit: 2017-05-22 | Discharge: 2017-05-22 | Disposition: A | Discharge status: Home / Self Care | Payer: Medicare HMO | Source: Ambulatory Visit | Attending: Infectious Disease | Admitting: Infectious Disease

## 2017-05-22 DIAGNOSIS — B20 Human immunodeficiency virus [HIV] disease: Secondary | ICD-10-CM | POA: Diagnosis not present

## 2017-05-22 LAB — COMPLETE METABOLIC PANEL WITH GFR
ALT: 19 U/L (ref 9–46)
AST: 20 U/L (ref 10–35)
Albumin: 3.8 g/dL (ref 3.6–5.1)
Alkaline Phosphatase: 77 U/L (ref 40–115)
BUN: 30 mg/dL — ABNORMAL HIGH (ref 7–25)
CHLORIDE: 109 mmol/L (ref 98–110)
CO2: 23 mmol/L (ref 20–31)
CREATININE: 3.67 mg/dL — AB (ref 0.70–1.18)
Calcium: 8.7 mg/dL (ref 8.6–10.3)
GFR, EST AFRICAN AMERICAN: 18 mL/min — AB (ref >=60)
GFR, Est Non African American: 15 mL/min — ABNORMAL LOW (ref >=60)
Glucose, Bld: 94 mg/dL (ref 65–99)
Potassium: 4.3 mmol/L (ref 3.5–5.3)
SODIUM: 140 mmol/L (ref 135–146)
Total Bilirubin: 0.5 mg/dL (ref 0.2–1.2)
Total Protein: 6.7 g/dL (ref 6.1–8.1)

## 2017-05-22 LAB — LIPID PANEL
CHOL/HDL RATIO: 3.3 ratio (ref <5.0)
CHOLESTEROL: 154 mg/dL (ref <200)
HDL: 47 mg/dL (ref >40)
LDL Cholesterol: 95 mg/dL (ref <100)
TRIGLYCERIDES: 62 mg/dL (ref <150)
VLDL: 12 mg/dL (ref <30)

## 2017-05-22 LAB — CBC WITH DIFFERENTIAL/PLATELET
BASOS PCT: 1 %
Basophils Absolute: 47 cells/uL (ref 0–200)
EOS PCT: 7 %
Eosinophils Absolute: 329 cells/uL (ref 15–500)
HCT: 35.7 % — ABNORMAL LOW (ref 38.5–50.0)
Hemoglobin: 11.5 g/dL — ABNORMAL LOW (ref 13.2–17.1)
LYMPHS PCT: 31 %
Lymphs Abs: 1457 cells/uL (ref 850–3900)
MCH: 33.3 pg — ABNORMAL HIGH (ref 27.0–33.0)
MCHC: 32.2 g/dL (ref 32.0–36.0)
MCV: 103.5 fL — AB (ref 80.0–100.0)
MONOS PCT: 9 %
MPV: 9.7 fL (ref 7.5–12.5)
Monocytes Absolute: 423 cells/uL (ref 200–950)
Neutro Abs: 2444 cells/uL (ref 1500–7800)
Neutrophils Relative %: 52 %
PLATELETS: 208 10*3/uL (ref 140–400)
RBC: 3.45 MIL/uL — AB (ref 4.20–5.80)
RDW: 15.6 % — AB (ref 11.0–15.0)
WBC: 4.7 10*3/uL (ref 3.8–10.8)

## 2017-05-23 LAB — RPR TITER: RPR Titer: 1:1 {titer}

## 2017-05-23 LAB — URINE CYTOLOGY ANCILLARY ONLY
Chlamydia: NEGATIVE
Neisseria Gonorrhea: NEGATIVE

## 2017-05-23 LAB — T-HELPER CELL (CD4) - (RCID CLINIC ONLY)
CD4 T CELL HELPER: 21 % — AB (ref 33–55)
CD4 T Cell Abs: 320 /uL — ABNORMAL LOW (ref 400–2700)

## 2017-05-23 LAB — RPR: RPR Ser Ql: REACTIVE — AB

## 2017-05-24 LAB — FLUORESCENT TREPONEMAL AB(FTA)-IGG-BLD: Fluorescent Treponemal ABS: REACTIVE — AB

## 2017-05-25 LAB — HIV-1 RNA QUANT-NO REFLEX-BLD
HIV 1 RNA QUANT: DETECTED {copies}/mL — AB
HIV-1 RNA Quant, Log: <1.3 Log copies/mL — AB

## 2017-06-05 ENCOUNTER — Ambulatory Visit: Payer: Medicare HMO | Admitting: Infectious Disease

## 2017-06-05 DIAGNOSIS — N2581 Secondary hyperparathyroidism of renal origin: Secondary | ICD-10-CM | POA: Diagnosis not present

## 2017-06-05 DIAGNOSIS — N189 Chronic kidney disease, unspecified: Secondary | ICD-10-CM | POA: Diagnosis not present

## 2017-06-05 DIAGNOSIS — N184 Chronic kidney disease, stage 4 (severe): Secondary | ICD-10-CM | POA: Diagnosis not present

## 2017-06-08 ENCOUNTER — Ambulatory Visit (INDEPENDENT_AMBULATORY_CARE_PROVIDER_SITE_OTHER): Payer: Medicare HMO | Admitting: Infectious Disease

## 2017-06-08 ENCOUNTER — Ambulatory Visit: Payer: Medicare HMO

## 2017-06-08 ENCOUNTER — Encounter: Payer: Self-pay | Admitting: Infectious Disease

## 2017-06-08 ENCOUNTER — Telehealth: Payer: Self-pay | Admitting: *Deleted

## 2017-06-08 VITALS — BP 125/74 | HR 63 | Temp 97.7°F | Ht 74.0 in | Wt 215.0 lb

## 2017-06-08 DIAGNOSIS — N289 Disorder of kidney and ureter, unspecified: Secondary | ICD-10-CM

## 2017-06-08 DIAGNOSIS — A529 Late syphilis, unspecified: Secondary | ICD-10-CM | POA: Diagnosis not present

## 2017-06-08 DIAGNOSIS — I420 Dilated cardiomyopathy: Secondary | ICD-10-CM | POA: Diagnosis not present

## 2017-06-08 DIAGNOSIS — B20 Human immunodeficiency virus [HIV] disease: Secondary | ICD-10-CM | POA: Diagnosis not present

## 2017-06-08 DIAGNOSIS — R7309 Other abnormal glucose: Secondary | ICD-10-CM | POA: Diagnosis not present

## 2017-06-08 DIAGNOSIS — Z113 Encounter for screening for infections with a predominantly sexual mode of transmission: Secondary | ICD-10-CM

## 2017-06-08 MED ORDER — BICTEGRAVIR-EMTRICITAB-TENOFOV 50-200-25 MG PO TABS: 1.0000 | ORAL_TABLET | Freq: Every day | ORAL | 11 refills | Status: DC

## 2017-06-08 NOTE — Progress Notes (Signed)
Chief complaint: followup for HIV, CKD  Subjective:    Patient ID: David [name], male    DOB: [birth year], 76 y.o.   MRN: [id number]  HPI  76 year old diagnosed with  HIV + man with AIDS in June 2015, prior syphilis and now acute on chronic kidney disease,  in whom we consolidated to [name] and who has maintained nice virological suppression and CD4 that has risen above 300.   Lab Results  Component Value Date   HIV1RNAQUANT <20 DETECTED (A) 05/22/2017   HIV1RNAQUANT 53 (H) 12/05/2016   HIV1RNAQUANT 100 (H) 06/12/2016      Lab Results  Component Value Date   CD4TABS 320 (L) 05/22/2017   CD4TABS 350 (L) 12/05/2016   CD4TABS 170 (L) 06/12/2016     Past Medical History:  Diagnosis Date  . Anemia, iron deficiency 11/15/2011  . Arthritis   . Cancer [facility])    hx of prostate; s/p radioactive seed implant 10/2009 Dr Janice Norrie  . Chronic kidney disease   . Glaucoma   . Heart murmur    "mild" per pt  . History of cardiac cath 2004   negative for CAD  . History of myocardial perfusion scan 02/2010   negative for coronary insufficiency (LVEF 27%)  . HIV infection ([place])   . Hyperlipidemia   . Hypertension    followed by [facility] and Vascular (Dr Dani Gobble Croitoru)  . Nonischemic cardiomyopathy ([place])   . Sinus bradycardia   . Stroke [facility])    "mini stroke" years ago  . Systolic and diastolic CHF, acute ([place]) 06/2010   felt to be secondary to hypertensive cardiomyopathy    Past Surgical History:  Procedure Laterality Date  . AV FISTULA PLACEMENT Right 10/13/2016   Procedure: ARTERIOVENOUS (AV) FISTULA CREATION;  Surgeon: [name], MD;  Location: [place];  Service: Vascular;  Laterality: Right;  . BASCILIC VEIN TRANSPOSITION Left 06/24/2015   Procedure: BASILIC VEIN TRANSPOSITION;  Surgeon: Mal Misty, MD;  Location: [place];  Service: Vascular;  Laterality: Left;  . CARDIAC CATHETERIZATION  02/04/2003   mildly depressed LV systolic fx EF 71%,GGYIRS coronaries/abdominal  aorta/renal arteries.  . COLONOSCOPY    . ESOPHAGOGASTRODUODENOSCOPY (EGD) WITH PROPOFOL N/A 05/05/2014   Procedure: ESOPHAGOGASTRODUODENOSCOPY (EGD) WITH PROPOFOL;  Surgeon: Missy Sabins, MD;  [facility];  Service: Endoscopy;  Laterality: N/A;  . EYE SURGERY Bilateral    cataract surgery   . EYE SURGERY Bilateral    glaucoma surgery  . NM MYOCAR PERF WALL MOTION  02/21/2010   normal  . RADIOACTIVE SEED IMPLANT  2010   prostate cancer  . US ECHOCARDIOGRAPHY  02/06/2012   mild LVH,LA mod. dilated,mild-mod. 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 AI.    Family History  Problem Relation Age of Onset  . Hypertension Mother   . Thyroid disease Mother   . Cholelithiasis Daughter   . Cholelithiasis Son   . Hypertension Maternal Grandmother   . Diabetes Maternal Grandmother   . Heart attack Neg Hx   . Hyperlipidemia Neg Hx       Social History   Social History  . Marital status: Married    Spouse name: N/A  . Number of children: 2  . Years of education: N/A   Occupational History  . retired Retired   Social History Main Topics  . Smoking status: Former Smoker    Quit date: 12/04/2005  . Smokeless tobacco: Never Used  . Alcohol use Yes  Comment: once a week-wine  . Drug use: No  . Sexual activity: Not on file   Other Topics Concern  . Not on file   Social History Narrative   Stays active at home   Regular exercise: no    Allergies  Allergen Reactions  . Codeine Other (See Comments)    Causes constipation.   Blanchard Kelch [Losartan Potassium] Other (See Comments)    constipation     Current Outpatient Prescriptions:  .  acetaminophen (TYLENOL) 500 MG tablet, Take 500 mg by mouth every 4 (four) hours as needed for moderate pain or headache. , Disp: , Rfl:  .  amLODipine (NORVASC) 10 MG tablet, TAKE 1 TABLET EVERY DAY, Disp: 90 tablet, Rfl: 3 .  aspirin 325 MG EC tablet, Take 325 mg by mouth every evening. , Disp: , Rfl:  .   brimonidine (ALPHAGAN) 0.15 % ophthalmic solution, Place 1 drop into the left eye 2 (two) times daily. , Disp: , Rfl: 1 .  carvedilol (COREG) 25 MG tablet, TAKE 1 TABLET TWICE DAILY, Disp: 180 tablet, Rfl: 3 .  dorzolamide-timolol (COSOPT) 22.3-6.8 MG/ML ophthalmic solution, Place 1 drop into the left eye 2 (two) times daily. , Disp: , Rfl:  .  latanoprost (XALATAN) 0.005 % ophthalmic solution, Place 1 drop into the left eye at bedtime. Use 1 drop in left eye at bedtime, Disp: , Rfl:  .  multivitamin (RENA-VIT) TABS tablet, Take 1 tablet by mouth daily., Disp: , Rfl: 3 .  oxyCODONE-acetaminophen (PERCOCET/ROXICET) 5-325 MG tablet, Take 1 tablet by mouth every 6 (six) hours as needed., Disp: 6 tablet, Rfl: 0 .  terazosin (HYTRIN) 10 MG capsule, Take 10 mg by mouth at bedtime. , Disp: , Rfl:  .  TRIUMEQ 600-50-300 MG tablet, TAKE 1 TABLET BY MOUTH EVERY DAY, Disp: 30 tablet, Rfl: 5 .  Vitamin D, Ergocalciferol, (DRISDOL) 50000 UNITS CAPS capsule, TK ONE C PO Q  WEEK, Disp: , Rfl: 2   Review of Systems  Constitutional: Negative for activity change, appetite change, chills, fatigue, fever and unexpected weight change.  HENT: Negative for congestion, rhinorrhea, sinus pressure, sneezing, sore throat and trouble swallowing.   Respiratory: Negative for cough and shortness of breath.   Cardiovascular: Negative for chest pain, palpitations and leg swelling.  Gastrointestinal: Negative for abdominal distention, abdominal pain, constipation, nausea and vomiting.  Genitourinary: Negative for hematuria.  Musculoskeletal: Negative for myalgias.  Skin: Negative for color change, pallor, rash and wound.  Neurological: Negative for dizziness, tremors, weakness and light-headedness.  Hematological: Negative for adenopathy. Does not bruise/bleed easily.  Psychiatric/Behavioral: Negative for agitation, behavioral problems, confusion, decreased concentration and sleep disturbance.       Objective:   Physical  Exam  Constitutional: He is oriented to person, place, and time. He appears well-developed and well-nourished. No distress.  HENT:  Head: Normocephalic and atraumatic.  Mouth/Throat: Oropharynx is clear and moist. No oropharyngeal exudate.  Eyes: Conjunctivae and EOM are normal. No scleral icterus.  Neck: Normal range of motion. Neck supple.  Cardiovascular: Normal rate and regular rhythm.   Pulmonary/Chest: Effort normal and breath sounds normal. No respiratory distress.  Abdominal: He exhibits no distension.  Musculoskeletal: He exhibits no edema or tenderness.  Neurological: He is alert and oriented to person, place, and time. He has normal reflexes. He exhibits normal muscle tone. Coordination normal.  Skin: Skin is warm and dry. He is not diaphoretic. No erythema. No pallor.  Psychiatric: He has a normal mood and affect. His  behavior is normal. Judgment and thought content normal.          Assessment & Plan:   HIV/AIDS: given his cardiac problems (albeit not ischemic ones) will try to change to [facility] and have ID pharmacy see in one month  Syphilis:titers down  CKD: followed by Dr Posey Pronto  HTN: followup with PCP.    There were no vitals filed for this visit.   NICM: Cardiology should also be following I believe   I spent greater than 25 minutes with the patient including greater than 50% of time in face to face counsel of the patient re his HIV, new ARV regimen syphilis, his CKD, his HTN and in coordination of his care.

## 2017-06-08 NOTE — Progress Notes (Signed)
Yes, I am still seeing him, but only once a year since he has done well recently. Appointment next January. Carole Civil, Dani Gobble

## 2017-06-08 NOTE — Progress Notes (Signed)
HPI: David [name] is a 76 y.o. male who presents today for HIV follow up.  Allergies: Allergies  Allergen Reactions  . Codeine Other (See Comments)    Causes constipation.   David [name] [Losartan Potassium] Other (See Comments)    constipation    Vitals: Temp: 97.7 F (36.5 C) (07/06 0932) Temp Source: Oral (07/06 0932) BP: 125/74 (07/06 0932) Pulse Rate: 63 (07/06 0932)  Past Medical History: Past Medical History:  Diagnosis Date  . Anemia, iron deficiency 11/15/2011  . Arthritis   . Cancer [facility])    hx of prostate; s/p radioactive seed implant 10/2009 Dr Janice Norrie  . Chronic kidney disease   . Glaucoma   . Heart murmur    "mild" per pt  . History of cardiac cath 2004   negative for CAD  . History of myocardial perfusion scan 02/2010   negative for coronary insufficiency (LVEF 27%)  . HIV infection ([place])   . Hyperlipidemia   . Hypertension    followed by [facility] and Vascular (Dr Dani Gobble Croitoru)  . Nonischemic cardiomyopathy ([place])   . Sinus bradycardia   . Stroke [facility])    "mini stroke" years ago  . Systolic and diastolic CHF, acute ([place]) 06/2010   felt to be secondary to hypertensive cardiomyopathy    Social History: Social History   Social History  . Marital status: Married    Spouse name: N/A  . Number of children: 2  . Years of education: N/A   Occupational History  . retired Retired   Social History Main Topics  . Smoking status: Former Smoker    Quit date: 12/04/2005  . Smokeless tobacco: Never Used  . Alcohol use Yes     Comment: once a week-wine  . Drug use: No  . Sexual activity: Not Asked   Other Topics Concern  . None   Social History Narrative   Stays active at home   Regular exercise: no    Current Regimen: Triumeq daily  Labs: HIV 1 RNA Quant (copies/mL)  Date Value  05/22/2017 <20 DETECTED (A)  12/05/2016 53 (H)  06/12/2016 100 (H)   CD4 T Cell Abs (/uL)  Date Value  05/22/2017 320 (L)  12/05/2016 350 (L)   06/12/2016 170 (L)   Hepatitis B Surface Ag (no units)  Date Value  05/12/2014 NEGATIVE   HCV Ab (no units)  Date Value  05/12/2014 NEGATIVE    CrCl: Estimated Creatinine Clearance: 20.2 mL/min (A) (by C-G formula based on SCr of 3.67 mg/dL (H)).  Lipids:    Component Value Date/Time   CHOL 154 05/22/2017 0926   TRIG 62 05/22/2017 0926   HDL 47 05/22/2017 0926   CHOLHDL 3.3 05/22/2017 0926   VLDL 12 05/22/2017 0926   LDLCALC 95 05/22/2017 0926    Assessment: David [name] is a 76 year old male who presents today for his HIV follow up appointment. He is currently on daily Triumeq and reports doing well with this medication. He denies any missed doses or issues with his medication.  He currently fills his Triumeq at the [facility] on [address]. Today, we will change his regimen to [place] daily. David [name] states he has about a month supply left of his Triumeq. He would like to finish this bottle before starting his [name]. A prescription has been sent to [facility], I will follow up to make sure his SPAP is applied and the patient does not have any copay.  Recommendations: - Discontinue Triumeq once supply has  been used - [employer] daily - Follow up appointment scheduled with pharmacy ~ 1 month after starting [place], 8/31 at [place], PharmD, [name] PGY-2 Infectious Diseases Pharmacy Resident Pager: [phone] 06/08/2017, 9:56 AM

## 2017-06-08 NOTE — Patient Instructions (Signed)
We will try to switch you to [facility] to take the place of [place]  I feel this medicine would  Be better option given issues with your heart in particular  If we can make this switch we will have you see Cassie Kuppelweiser in one month to see you and recheck your labs  [place] today as well

## 2017-06-08 NOTE — Telephone Encounter (Signed)
PA for [facility] completed & sent through CoverMyMeds.  48-72 hours for approval.

## 2017-06-10 NOTE — Telephone Encounter (Signed)
Excellent thanks Langley Gauss!

## 2017-06-13 DIAGNOSIS — D631 Anemia in chronic kidney disease: Secondary | ICD-10-CM | POA: Diagnosis not present

## 2017-06-13 DIAGNOSIS — N184 Chronic kidney disease, stage 4 (severe): Secondary | ICD-10-CM | POA: Diagnosis not present

## 2017-06-13 DIAGNOSIS — Z6827 Body mass index (BMI) 27.0-27.9, adult: Secondary | ICD-10-CM | POA: Diagnosis not present

## 2017-06-13 DIAGNOSIS — N2581 Secondary hyperparathyroidism of renal origin: Secondary | ICD-10-CM | POA: Diagnosis not present

## 2017-06-13 DIAGNOSIS — I129 Hypertensive chronic kidney disease with stage 1 through stage 4 chronic kidney disease, or unspecified chronic kidney disease: Secondary | ICD-10-CM | POA: Diagnosis not present

## 2017-06-13 NOTE — Telephone Encounter (Signed)
Approved through 06/10/2018. Pharmacy notified. Landis Gandy, RN

## 2017-06-28 ENCOUNTER — Encounter: Payer: Self-pay | Admitting: Infectious Disease

## 2017-07-11 DIAGNOSIS — Z961 Presence of intraocular lens: Secondary | ICD-10-CM | POA: Diagnosis not present

## 2017-07-11 DIAGNOSIS — H401133 Primary open-angle glaucoma, bilateral, severe stage: Secondary | ICD-10-CM | POA: Diagnosis not present

## 2017-07-31 ENCOUNTER — Ambulatory Visit (HOSPITAL_BASED_OUTPATIENT_CLINIC_OR_DEPARTMENT_OTHER)
Admission: RE | Admit: 2017-07-31 | Discharge: 2017-07-31 | Disposition: A | Discharge status: Home / Self Care | Payer: Medicare HMO | Source: Ambulatory Visit | Attending: Family | Admitting: Family

## 2017-07-31 ENCOUNTER — Telehealth: Payer: Self-pay | Admitting: Family

## 2017-07-31 ENCOUNTER — Ambulatory Visit (INDEPENDENT_AMBULATORY_CARE_PROVIDER_SITE_OTHER): Payer: Medicare HMO | Admitting: Family

## 2017-07-31 ENCOUNTER — Encounter: Payer: Self-pay | Admitting: Family

## 2017-07-31 VITALS — BP 140/72 | HR 57 | Temp 97.5°F | Resp 18 | Ht 74.0 in | Wt 213.0 lb

## 2017-07-31 DIAGNOSIS — I10 Essential (primary) hypertension: Secondary | ICD-10-CM | POA: Diagnosis not present

## 2017-07-31 DIAGNOSIS — B20 Human immunodeficiency virus [HIV] disease: Secondary | ICD-10-CM | POA: Diagnosis not present

## 2017-07-31 DIAGNOSIS — R55 Syncope and collapse: Secondary | ICD-10-CM

## 2017-07-31 DIAGNOSIS — N189 Chronic kidney disease, unspecified: Secondary | ICD-10-CM

## 2017-07-31 LAB — CBC WITH DIFFERENTIAL/PLATELET
BASOS PCT: 1 % (ref 0.0–3.0)
Basophils Absolute: 0 10*3/uL (ref 0.0–0.1)
EOS PCT: 6.7 % — AB (ref 0.0–5.0)
Eosinophils Absolute: 0.3 10*3/uL (ref 0.0–0.7)
HEMATOCRIT: 36.9 % — AB (ref 39.0–52.0)
HEMOGLOBIN: 12 g/dL — AB (ref 13.0–17.0)
LYMPHS PCT: 27.7 % (ref 12.0–46.0)
Lymphs Abs: 1.4 10*3/uL (ref 0.7–4.0)
MCHC: 32.4 g/dL (ref 30.0–36.0)
MCV: 106.7 fl — AB (ref 78.0–100.0)
MONO ABS: 0.4 10*3/uL (ref 0.1–1.0)
MONOS PCT: 8.1 % (ref 3.0–12.0)
Neutro Abs: 2.8 10*3/uL (ref 1.4–7.7)
Neutrophils Relative %: 56.5 % (ref 43.0–77.0)
Platelets: 218 10*3/uL (ref 150.0–400.0)
RBC: 3.46 Mil/uL — AB (ref 4.22–5.81)
RDW: 14.1 % (ref 11.5–15.5)
WBC: 5 10*3/uL (ref 4.0–10.5)

## 2017-07-31 LAB — TSH: TSH: 1.1 u[IU]/mL (ref 0.35–4.50)

## 2017-07-31 LAB — COMPREHENSIVE METABOLIC PANEL
ALBUMIN: 4 g/dL (ref 3.5–5.2)
ALT: 18 U/L (ref 0–53)
AST: 20 U/L (ref 0–37)
Alkaline Phosphatase: 69 U/L (ref 39–117)
BILIRUBIN TOTAL: 0.4 mg/dL (ref 0.2–1.2)
BUN: 43 mg/dL — ABNORMAL HIGH (ref 6–23)
CALCIUM: 9.6 mg/dL (ref 8.4–10.5)
CO2: 28 meq/L (ref 19–32)
Chloride: 106 mEq/L (ref 96–112)
Creatinine, Ser: 3.81 mg/dL — ABNORMAL HIGH (ref 0.40–1.50)
GFR: 20 mL/min — ABNORMAL LOW (ref >60.00)
Glucose, Bld: 100 mg/dL — ABNORMAL HIGH (ref 70–99)
Potassium: 4.6 mEq/L (ref 3.5–5.1)
Sodium: 142 mEq/L (ref 135–145)
TOTAL PROTEIN: 7.6 g/dL (ref 6.0–8.3)

## 2017-07-31 NOTE — Progress Notes (Signed)
Subjective:    Patient ID: David [name], male    DOB: 28-Aug-1941, 76 y.o.   MRN: [id number]  HPI  David [name] is a 76 yr old male who presents today for follow up.  HTN- Patient is maintained on amlodipine 10mg , coreg 25mg  bid, hytrin.  BP Readings from Last 3 Encounters:  07/31/17 140/72  06/08/17 125/74  01/02/17 132/70   LOC- reports episode of LOC last week on Thursday. Reports that he was picking up a water heater the other day and "Next thing I knew I was getting up off the ground."  Reports that he bumped the back of his head. Episode was unwitnessed.  Denies associated CP/SOB or dizziness.  Did not have loss of control of bowel or bladder. Denies recent long travel, calf pain or swelling.  Review of Systems See HPI  Past Medical History:  Diagnosis Date  . Anemia, iron deficiency 11/15/2011  . Arthritis   . Cancer [facility])    hx of prostate; s/p radioactive seed implant 10/2009 Dr Janice Norrie  . Chronic kidney disease   . Glaucoma   . Heart murmur    "mild" per pt  . History of cardiac cath 2004   negative for CAD  . History of myocardial perfusion scan 02/2010   negative for coronary insufficiency (LVEF 27%)  . HIV infection ([place])   . Hyperlipidemia   . Hypertension    [facility]ed [facility] and Vascular (Dr Dani Gobble Croitoru)  . Nonischemic cardiomyopathy ([place])   . Sinus bradycardia   . Stroke [facility])    "mini stroke" years ago  . Systolic and diastolic CHF, acute ([place]) 06/2010   felt to be secondary to hypertensive cardiomyopathy     Social History   Social History  . Marital status: Married    Spouse name: N/A  . Number of children: 2  . Years of education: N/A   Occupational History  . retired Retired   Social History Main Topics  . Smoking status: Former Smoker    Quit date: 12/04/2005  . Smokeless tobacco: Never Used  . Alcohol use Yes     Comment: once a week-wine  . Drug use: No  . Sexual activity: Not on file   Other Topics Concern  . Not on  file   Social History Narrative   Stays active at home   Regular exercise: no    Past Surgical History:  Procedure Laterality Date  . AV FISTULA PLACEMENT Right 10/13/2016   Procedure: ARTERIOVENOUS (AV) FISTULA CREATION;  Surgeon: [name], MD;  Location: [place];  Service: Vascular;  Laterality: Right;  . BASCILIC VEIN TRANSPOSITION Left 06/24/2015   Procedure: BASILIC VEIN TRANSPOSITION;  Surgeon: Mal Misty, MD;  Location: [place];  Service: Vascular;  Laterality: Left;  . CARDIAC CATHETERIZATION  02/04/2003   mildly depressed LV systolic fx EF 50%,[id number] coronaries/abdominal aorta/renal arteries.  . COLONOSCOPY    . ESOPHAGOGASTRODUODENOSCOPY (EGD) WITH PROPOFOL N/A 05/05/2014   Procedure: ESOPHAGOGASTRODUODENOSCOPY (EGD) WITH PROPOFOL;  Surgeon: Missy Sabins, MD;  Location: [facility];  Service: Endoscopy;  Laterality: N/A;  . EYE SURGERY Bilateral    cataract surgery   . EYE SURGERY Bilateral    glaucoma surgery  . NM MYOCAR PERF WALL MOTION  02/21/2010   normal  . RADIOACTIVE SEED IMPLANT  2010   prostate cancer  . US ECHOCARDIOGRAPHY  02/06/2012   mild LVH,LA mod. dilated,mild-mod. 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 AI.  Family History  Problem Relation Age of Onset  . Hypertension Mother   . Thyroid disease Mother   . Cholelithiasis Daughter   . Cholelithiasis Son   . Hypertension Maternal Grandmother   . Diabetes Maternal Grandmother   . Heart attack Neg Hx   . Hyperlipidemia Neg Hx     Allergies  Allergen Reactions  . Codeine Other (See Comments)    Causes constipation.   Blanchard Kelch [Losartan Potassium] Other (See Comments)    constipation    Current Outpatient Prescriptions on File Prior to Visit  Medication Sig Dispense Refill  . acetaminophen (TYLENOL) 500 MG tablet Take 500 mg by mouth every 4 (four) hours as needed for moderate pain or headache.     Marland Kitchen amLODipine (NORVASC) 10 MG tablet TAKE 1 TABLET EVERY DAY 90 tablet  3  . aspirin 325 MG EC tablet Take 325 mg by mouth every evening.     . bictegravir-emtricitabine-tenofovir AF ([name]) 50-200-25 MG TABS tablet Take 1 tablet by mouth daily. 30 tablet 11  . brimonidine (ALPHAGAN) 0.15 % ophthalmic solution Place 1 drop into the left eye 2 (two) times daily.   1  . carvedilol (COREG) 25 MG tablet TAKE 1 TABLET TWICE DAILY 180 tablet 3  . dorzolamide-timolol (COSOPT) 22.3-6.8 MG/ML ophthalmic solution Place 1 drop into the left eye 2 (two) times daily.     Marland Kitchen latanoprost (XALATAN) 0.005 % ophthalmic solution Place 1 drop into the left eye at bedtime. Use 1 drop in left eye at bedtime    . multivitamin (RENA-VIT) TABS tablet Take 1 tablet by mouth daily.  3  . oxyCODONE-acetaminophen (PERCOCET/ROXICET) 5-325 MG tablet Take 1 tablet by mouth every 6 (six) hours as needed. 6 tablet 0  . terazosin (HYTRIN) 10 MG capsule Take 10 mg by mouth at bedtime.     . Vitamin D, Ergocalciferol, (DRISDOL) 50000 UNITS CAPS capsule TK ONE C PO Q  WEEK  2   No current facility-administered medications on file prior to visit.     BP 140/72 (BP Location: Right Arm, Cuff Size: Large)   Pulse (!) 57   Temp (!) 97.5 F (36.4 C) (Oral)   Resp 18   Ht 6\' 2"  (1.88 m)   Wt 213 lb (96.6 kg)   SpO2 99%   BMI 27.35 kg/m       Objective:   Physical Exam  Constitutional: He is oriented to person, place, and time. He appears well-developed and well-nourished. No distress.  HENT:  Head: Normocephalic and atraumatic.  Cardiovascular: Normal rate and regular rhythm.   No murmur heard. Pulmonary/Chest: Effort normal and breath sounds normal. No respiratory distress. He has no wheezes. He has no rales.  Musculoskeletal: He exhibits no edema.  Neurological: He is alert and oriented to person, place, and time.  Skin: Skin is warm and dry.  Psychiatric: He has a normal mood and affect. His behavior is normal. Thought content normal.          Assessment & Plan:  Syncope- BP  looks good.  CT head neg for CVA or bleed. He has hx of non-ischemic CM (most recent EF 45-50%) followed by cardiology. EKG is performed today and personally reviewed. Shows sinus brady, with TWI in leads II, III and V6.  Appears unchanged compared to previous. Refer back to cardiology for further cardiac work up.  CBC shows mild anemia with hgb of 12 which is stable.    HTN- BP stable on current med.  No orthostasis today.    CKD- creatinine 3.8, previous cr was 3.67.  He is followed by nephrology.   HIV/AIDS- maintained on [name]. Management per ID.   Lab Results  Component Value Date   CD4TCELL 21 (L) 05/22/2017   CD4TABS 320 (L) 05/22/2017

## 2017-07-31 NOTE — Telephone Encounter (Signed)
Pt called in to schedule with PCP while scheduling pt mentioned having some dizziness, he said that his bp has been high. Pt says that he takes his medication as prescribed so he's not sure. Transferred call to team health.

## 2017-07-31 NOTE — Patient Instructions (Signed)
Please complete CT scan on the first floor. Complete lab work prior to leaving. We will work on getting you set up for a follow up with your cardiologist.  Please go to the ER if if you develop recurrent fainting.

## 2017-07-31 NOTE — Telephone Encounter (Signed)
Patient Name: David [name]  DOB: Mar 24, 1941    Initial Comment Caller states he is dizzy, BP is high.190/91   Nurse Assessment  Nurse: Yolanda Bonine, RN, Erin Date/Time (Eastern Time): 07/31/2017 9:29:20 AM  Confirm and document reason for call. If symptomatic, describe symptoms. ---Caller states he is dizzy, BP is high.190/91. He states he passed out and fell last Thursday. Feels dizzy and lightheaded.  Does the patient have any new or worsening symptoms? ---Yes  Will a triage be completed? ---Yes  Related visit to physician within the last 2 weeks? ---No  Does the PT have any chronic conditions? (i.e. diabetes, asthma, etc.) ---Yes  List chronic conditions. ---HTN HIV  Is this a behavioral health or substance abuse call? ---No     Guidelines    Guideline Title Affirmed Question Affirmed Notes  Dizziness - Lightheadedness [1] MODERATE dizziness (e.g., interferes with normal activities) AND [2] has NOT been evaluated by physician for this (Exception: dizziness caused by heat exposure, sudden standing, or poor fluid intake)    Final Disposition User   See Physician within 4 Hours (or PCP triage) Yolanda Bonine, RN, Junie Panning    Comments  Has not taken B/P medication this morning  Pt states he felt nauseated earier but feel light headed now. Medications have recently been changed for HIV  Caller stated when he passed out on Thursday no one was with him and he got up and went back to work. Stated he never felt dizzy or weak before passing out. Stated " I was just walking and the next thing i know I was on the ground  appt scheduled for 1215 with Dr Inda Castle   Referrals  GO TO FACILITY REFUSED   Disagree/Comply: Comply

## 2017-08-01 ENCOUNTER — Telehealth: Payer: Self-pay | Admitting: *Deleted

## 2017-08-01 NOTE — Telephone Encounter (Signed)
Notified pt. He states he continues to have dizziness daily and is worse in the morning upon waking up. Also notes that he "gags a lot first thing in the morning. Reports bitter liquid comes up in his throat in the mornings as well.  Please advise?

## 2017-08-01 NOTE — Telephone Encounter (Signed)
-----   Message from [name], NP sent at 07/31/2017  5:16 PM EDT ----- CT negative for stroke.  Kidney function abnormal but stable. Thyroid function looks good. Anemia stable.

## 2017-08-02 MED ORDER — RANITIDINE HCL 150 MG PO TABS: 150.0000 mg | ORAL_TABLET | Freq: Two times a day (BID) | ORAL | 2 refills | Status: DC

## 2017-08-02 NOTE — Telephone Encounter (Signed)
Called patient and made him aware.  He stated understanding and agreed with plan.  No additional questions or concerns voiced.

## 2017-08-02 NOTE — Telephone Encounter (Signed)
Please begin zantac to help with reflux symptoms. Rx sent to his local pharmacy.  I would like him to follow up with Cardiology for the dizziness.  Also- it is possible that his HIV med [place] Odor could be contributing to his dizziness.  Go to the ER if recurrent passing out.  Call if increased dizziness.

## 2017-08-03 ENCOUNTER — Ambulatory Visit (INDEPENDENT_AMBULATORY_CARE_PROVIDER_SITE_OTHER): Payer: Medicare HMO | Admitting: Pharmacist

## 2017-08-03 ENCOUNTER — Encounter: Payer: Self-pay | Admitting: Physician Assistant

## 2017-08-03 ENCOUNTER — Ambulatory Visit (INDEPENDENT_AMBULATORY_CARE_PROVIDER_SITE_OTHER): Payer: Medicare HMO | Admitting: Physician Assistant

## 2017-08-03 VITALS — BP 138/73 | HR 66 | Ht 74.0 in | Wt 215.0 lb

## 2017-08-03 DIAGNOSIS — R42 Dizziness and giddiness: Secondary | ICD-10-CM

## 2017-08-03 DIAGNOSIS — I5042 Chronic combined systolic (congestive) and diastolic (congestive) heart failure: Secondary | ICD-10-CM

## 2017-08-03 DIAGNOSIS — R55 Syncope and collapse: Secondary | ICD-10-CM

## 2017-08-03 DIAGNOSIS — I10 Essential (primary) hypertension: Secondary | ICD-10-CM | POA: Diagnosis not present

## 2017-08-03 DIAGNOSIS — B20 Human immunodeficiency virus [HIV] disease: Secondary | ICD-10-CM | POA: Diagnosis not present

## 2017-08-03 LAB — T-HELPER CELL (CD4) - (RCID CLINIC ONLY)
CD4 T CELL HELPER: 24 % — AB (ref 33–55)
CD4 T Cell Abs: 350 /uL — ABNORMAL LOW (ref 400–2700)

## 2017-08-03 NOTE — Progress Notes (Signed)
HPI: David [name] is a 76 y.o. male who presents to the [place] clinic for HIV follow-up.   Allergies: Allergies  Allergen Reactions  . Codeine Other (See Comments)    Causes constipation.   Blanchard Kelch [Losartan Potassium] Other (See Comments)    constipation    Past Medical History: Past Medical History:  Diagnosis Date  . Anemia, iron deficiency 11/15/2011  . Arthritis   . Cancer [facility])    hx of prostate; s/p radioactive seed implant 10/2009 Dr Janice Norrie  . Chronic kidney disease   . Glaucoma   . Heart murmur    "mild" per pt  . History of cardiac cath 2004   negative for CAD  . History of myocardial perfusion scan 02/2010   negative for coronary insufficiency (LVEF 27%)  . HIV infection ([place])   . Hyperlipidemia   . Hypertension    followed by [facility] and Vascular (Dr Dani Gobble Croitoru)  . Nonischemic cardiomyopathy ([place])   . Sinus bradycardia   . Stroke [name])    "mini stroke" years ago  . Systolic and diastolic CHF, acute ([place]) 06/2010   felt to be secondary to hypertensive cardiomyopathy    Social History: Social History   Social History  . Marital status: Married    Spouse name: N/A  . Number of children: 2  . Years of education: N/A   Occupational History  . retired Retired   Social History Main Topics  . Smoking status: Former Smoker    Quit date: 12/04/2005  . Smokeless tobacco: Never Used  . Alcohol use Yes     Comment: once a week-wine  . Drug use: No  . Sexual activity: Not on file   Other Topics Concern  . Not on file   Social History Narrative   Stays active at home   Regular exercise: no    Current Regimen: [name]  Labs: HIV 1 RNA Quant (copies/mL)  Date Value  05/22/2017 <20 DETECTED (A)  12/05/2016 53 (H)  06/12/2016 100 (H)   CD4 T Cell Abs (/uL)  Date Value  05/22/2017 320 (L)  12/05/2016 350 (L)  06/12/2016 170 (L)   Hepatitis B Surface Ag (no units)  Date Value  05/12/2014 NEGATIVE   HCV Ab (no units)   Date Value  05/12/2014 NEGATIVE    CrCl: Estimated Creatinine Clearance: 19.5 mL/min (A) (by C-G formula based on SCr of 3.81 mg/dL (H)).  Lipids:    Component Value Date/Time   CHOL 154 05/22/2017 0926   TRIG 62 05/22/2017 0926   HDL 47 05/22/2017 0926   CHOLHDL 3.3 05/22/2017 0926   VLDL 12 05/22/2017 0926   LDLCALC 95 05/22/2017 0926    Assessment: David [name] is here today to follow-up for his HIV infection. He was recently switched from Triumeq to [place] by Dr. Tommy Medal.  He comes in today after being on it for ~1 month.  He states he has been feeling sick the last 2 weeks. He also states he wakes up and "gags" almost throwing up.  He also had a syncopal episode and saw his PCP yesterday.  He had a CT of his brain that was normal.  He was told to follow-up with his cardiologist.  He has drug interactions with Genvoya and we want to stay away from the abacavir component of Triumeq.  He does not want to take two pills once daily (Tivicay and Descovy).  We decided he would stay on the [facility] for now. He has  not missed any doses and takes it every day. He wanted to make an appointment with Dr. Tommy Medal to discuss this as well. Offered to see him back in 2-3 weeks, but he was adamant about wanting to see Dr. Tommy Medal.  I will get labs today to assess his viral load.    Plans: - Continue [name] PO once daily for now - HIV viral load and CD4 today - F/u with Dr. Tommy Medal 9/19 at [place]. [place], PharmD, [place] [facility] 08/03/2017, 10:35 AM

## 2017-08-03 NOTE — Patient Instructions (Signed)
Medication Instructions:  NO CHANGES Your physician recommends that you continue on your current medications as directed. Please refer to the Current Medication list given to you today.  If you need a refill on your cardiac medications before your next appointment, please call your pharmacy.  Testing/Procedures: Your physician has requested that you have an echocardiogram. Echocardiography is a painless test that uses sound waves to create images of your heart. It provides your doctor with information about the size and shape of your heart and how well your heart's chambers and valves are working. This procedure takes approximately one hour. There are no restrictions for this procedure.  Follow-Up: Your physician wants you to follow-up in: [place].  Special Instructions: PRE-CERT LOOP RECORDER AND CALL WITH PRICE AND LET RHONDA KNOW IF AFFORDABLE SO SHE CAN ORDER   Thank you for choosing [facility] at [facility]!!

## 2017-08-03 NOTE — Progress Notes (Signed)
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 If that is ordered for prostate/bladder issues, can switch to flomax or uroxatral. If ordered for BP, should be stopped. I would rather increase carvedilol to 37.5 mg BID. MCr

## 2017-08-03 NOTE — Progress Notes (Signed)
Cardiology Office Note   Date:  08/03/2017   ID:  David [name], DOB 08-01-1941, MRN [id number]  PCP:  [name], NP  Cardiologist:  [name], 01/02/2017 Rosaria Ferries, PA-C   Chief Complaint  Patient presents with  . Dizziness    stomach pain, weakness in legs, vision issues    History of Present Illness: David [name] is a 76 y.o. male with a history of HIV+, AIDS 2015, CKD III-IV, syphillis, prostate CA s/p seeds, cath w/ nl cors 2004, HTN, HLD, S-D-CHF, NICM w/ EF 45-50% 2013 echo, brief atrial tach seen on event monitor 2012  Phone notes regarding him being dizzy, syncopal episode, blood pressure 190/91  David [name] presents for cardiology follow up and evaluation.  He passed out 08/23. He was in his [place] that day. He had just picked up the end of a water heater and put it on the truck. He was standing behind the truck and woke up on the ground. No one was around. He doesn't think he was out long. He had no prodrome and did not know anything was going to happen. He immediately knew who and where he was. He had a CT scan 08/28, no acute problem.   Ever since then, when he gets up in the morning, he is very dizzy at first. He feels nauseated and wonders if he will vomit. He gets up, has to hold on because balance is bad. He will gag up some phlegm, bitter-tasting at times. After he gets up and starts moving around, the sx resolve and do not start again.   No other GI symptoms.   Never has palpitations, never feels his heart skip or race. He gets tired walking and lifting things, cannot lift his leg well enough to get up in the truck after he has exerted himself. He feels sleepy a lot.  TSH was normal 08/28  His medication was changed last month from Triumeq 600-50-300 to [place] because of his cardiac issues.   He does not weigh himself daily, but has not been having any orthostatic dizziness. He denies lower extremity edema, orthopnea or  PND. Labs (CBC, CMET) were done on 08/28 by his PCP and showed no new significant abnormalities.   Past Medical History:  Diagnosis Date  . Anemia, iron deficiency 11/15/2011  . Arthritis   . Cancer [facility])    hx of prostate; s/p radioactive seed implant 10/2009 Dr Janice Norrie  . Chronic kidney disease   . Glaucoma   . Heart murmur    "mild" per pt  . History of cardiac cath 2004   negative for CAD  . History of myocardial perfusion scan 02/2010   negative for coronary insufficiency (LVEF 27%)  . HIV infection ([place])   . Hyperlipidemia   . Hypertension    followed by [facility] and Vascular (Dr Dani Gobble Croitoru)  . Nonischemic cardiomyopathy ([place])   . Sinus bradycardia   . Stroke [facility])    "mini stroke" years ago  . Systolic and diastolic CHF, acute ([place]) 06/2010   felt to be secondary to hypertensive cardiomyopathy    Past Surgical History:  Procedure Laterality Date  . AV FISTULA PLACEMENT Right 10/13/2016   Procedure: ARTERIOVENOUS (AV) FISTULA CREATION;  Surgeon: [name], MD;  Location: [place];  Service: Vascular;  Laterality: Right;  . BASCILIC VEIN TRANSPOSITION Left 06/24/2015   Procedure: BASILIC VEIN TRANSPOSITION;  Surgeon: Mal Misty, MD;  Location: [place];  Service:  Vascular;  Laterality: Left;  . CARDIAC CATHETERIZATION  02/04/2003   mildly depressed LV systolic fx EF 67%,MCNOBS coronaries/abdominal aorta/renal arteries.  . COLONOSCOPY    . ESOPHAGOGASTRODUODENOSCOPY (EGD) WITH PROPOFOL N/A 05/05/2014   Procedure: ESOPHAGOGASTRODUODENOSCOPY (EGD) WITH PROPOFOL;  Surgeon: Missy Sabins, MD;  [facility];  Service: Endoscopy;  Laterality: N/A;  . EYE SURGERY Bilateral    cataract surgery   . EYE SURGERY Bilateral    glaucoma surgery  . NM MYOCAR PERF WALL MOTION  02/21/2010   normal  . RADIOACTIVE SEED IMPLANT  2010   prostate cancer  . US ECHOCARDIOGRAPHY  02/06/2012   mild LVH,LA mod. dilated,mild-mod. 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 AI.    Current Outpatient Prescriptions  Medication Sig Dispense Refill  . acetaminophen (TYLENOL) 500 MG tablet Take 500 mg by mouth every 4 (four) hours as needed for moderate pain or headache.     Marland Kitchen amLODipine (NORVASC) 10 MG tablet TAKE 1 TABLET EVERY DAY 90 tablet 3  . aspirin 325 MG EC tablet Take 325 mg by mouth every evening.     . bictegravir-emtricitabine-tenofovir AF ([name]) 50-200-25 MG TABS tablet Take 1 tablet by mouth daily. 30 tablet 11  . brimonidine (ALPHAGAN) 0.15 % ophthalmic solution Place 1 drop into the left eye 2 (two) times daily.   1  . carvedilol (COREG) 25 MG tablet TAKE 1 TABLET TWICE DAILY 180 tablet 3  . dorzolamide-timolol (COSOPT) 22.3-6.8 MG/ML ophthalmic solution Place 1 drop into the left eye 2 (two) times daily.     Marland Kitchen latanoprost (XALATAN) 0.005 % ophthalmic solution Place 1 drop into the left eye at bedtime. Use 1 drop in left eye at bedtime    . multivitamin (RENA-VIT) TABS tablet Take 1 tablet by mouth daily.  3  . oxyCODONE-acetaminophen (PERCOCET/ROXICET) 5-325 MG tablet Take 1 tablet by mouth every 6 (six) hours as needed. 6 tablet 0  . ranitidine (ZANTAC) 150 MG tablet Take 1 tablet (150 mg total) by mouth 2 (two) times daily. 60 tablet 2  . terazosin (HYTRIN) 10 MG capsule Take 10 mg by mouth at bedtime.     . Vitamin D, Ergocalciferol, (DRISDOL) 50000 UNITS CAPS capsule TK ONE C PO Q  WEEK  2   No current facility-administered medications for this visit.     Allergies:   Codeine and Cozaar [losartan potassium]    Social History:  The patient  reports that he quit smoking about 11 years ago. He has never used smokeless tobacco. He reports that he drinks alcohol. He reports that he does not use drugs.   Family History:  The patient's family history includes Cholelithiasis in his daughter and son; Diabetes in his maternal grandmother; Hypertension in his maternal grandmother and mother; Thyroid disease in his mother.     ROS:  Please see the history of present illness. All other systems are reviewed and negative.    PHYSICAL EXAM: VS:  BP 138/73   Pulse 66   Ht 6\' 2"  (1.88 m)   Wt 215 lb (97.5 kg)   BMI 27.60 kg/m  , BMI Body mass index is 27.6 kg/m. GEN: Well nourished, well developed, male in no acute distress  HEENT: normal for age  Neck: no JVD, no carotid bruit, no masses Cardiac: RRR; Soft murmur, no rubs, or gallops Respiratory:  clear to auscultation bilaterally, normal work of breathing GI: soft, nontender, nondistended, + BS MS: no deformity or atrophy; no edema;  distal pulses are 2+ in all 4 extremities   Skin: warm and dry, no rash Neuro:  Strength and sensation are intact Psych: euthymic mood, full affect   EKG:  EKG is not ordered today. The ECG done 08/28 was reviewed and shows sinus rhythm, QRS duration is underneath 26 ms which is slightly wider than previous. He has late R wave progression compared to previous ECG   Recent Labs: 07/31/2017: ALT 18; BUN 43; Creatinine, Ser 3.81; Hemoglobin 12.0; Platelets 218.0; Potassium 4.6; Sodium 142; TSH 1.10    Lipid Panel    Component Value Date/Time   CHOL 154 05/22/2017 0926   TRIG 62 05/22/2017 0926   HDL 47 05/22/2017 0926   CHOLHDL 3.3 05/22/2017 0926   VLDL 12 05/22/2017 0926   LDLCALC 95 05/22/2017 0926     Wt Readings from Last 3 Encounters:  08/03/17 215 lb (97.5 kg)  07/31/17 213 lb (96.6 kg)  06/08/17 215 lb (97.5 kg)     Other studies Reviewed: Additional studies/ records that were reviewed today include: office notes and testing.  ASSESSMENT AND PLAN:  1.  Syncope: no CHF by exam, no CP, no palpitations. However, sx description are concerning for cardiac origin. He was advised that he should not drive. He was offered an event monitor versus a loop recorder. Because he works it is difficult for him to do an event monitor. He prefers to do a loop recorder. However, cost is an issue and he is concerned about  this. We will get this presurgery did an lead him no the cost of this as accurately as possible. Hopefully, he will be able to do it. We will check an echocardiogram either way.  2. Chronic combined systolic and diastolic CHF: He does not think he gets a lot of salt. He does not pay very much attention to what he is. However, he has no volume overload by exam. He seems to be doing well from a symptom standpoint as well. Continue current therapy. He is not currently on a diuretic. Will not start 1  3. Hypertension: His blood pressure is minimally above target. He is on amlodipine 10 mg daily, Coreg 25 mg twice a day, and Hytrin 10 mg daily at bedtime. No med changes  4. Dizziness and balance problems: These seem to occur first thing in the morning. They have started recently. It is unclear if they're related to his syncopal episode which had no prodrome. He has no unilateral weakness. A head CT showed no acute abnormality. Management per PCP   Current medicines are reviewed at length with the patient today.  The patient does not have concerns regarding medicines.  The following changes have been made:  no change  Labs/ tests ordered today include:   Orders Placed This Encounter  Procedures  . ECHOCARDIOGRAM COMPLETE     Disposition:   FU with [name]  Signed, Rosaria Ferries, PA-C  08/03/2017 6:03 PM    [place] Phone: [phone]; Fax: [phone]  This note was written with the assistance of speech recognition software. Please excuse any transcriptional errors.

## 2017-08-08 ENCOUNTER — Other Ambulatory Visit: Payer: Self-pay

## 2017-08-08 ENCOUNTER — Ambulatory Visit (HOSPITAL_COMMUNITY): Payer: Medicare HMO | Attending: Cardiology

## 2017-08-08 DIAGNOSIS — I509 Heart failure, unspecified: Secondary | ICD-10-CM | POA: Diagnosis not present

## 2017-08-08 DIAGNOSIS — E785 Hyperlipidemia, unspecified: Secondary | ICD-10-CM | POA: Diagnosis not present

## 2017-08-08 DIAGNOSIS — I340 Nonrheumatic mitral (valve) insufficiency: Secondary | ICD-10-CM | POA: Insufficient documentation

## 2017-08-08 DIAGNOSIS — I350 Nonrheumatic aortic (valve) stenosis: Secondary | ICD-10-CM | POA: Insufficient documentation

## 2017-08-08 DIAGNOSIS — I110 Hypertensive heart disease with heart failure: Secondary | ICD-10-CM | POA: Insufficient documentation

## 2017-08-08 DIAGNOSIS — B20 Human immunodeficiency virus [HIV] disease: Secondary | ICD-10-CM | POA: Insufficient documentation

## 2017-08-08 DIAGNOSIS — I429 Cardiomyopathy, unspecified: Secondary | ICD-10-CM | POA: Insufficient documentation

## 2017-08-08 DIAGNOSIS — R55 Syncope and collapse: Secondary | ICD-10-CM

## 2017-08-09 DIAGNOSIS — C61 Malignant neoplasm of prostate: Secondary | ICD-10-CM | POA: Diagnosis not present

## 2017-08-10 ENCOUNTER — Telehealth: Payer: Self-pay

## 2017-08-10 LAB — HIV-1 RNA QUANT-NO REFLEX-BLD
HIV 1 RNA QUANT: 56 {copies}/mL — AB
HIV-1 RNA Quant, Log: 1.75 Log copies/mL — ABNORMAL HIGH

## 2017-08-10 NOTE — Telephone Encounter (Signed)
Barrett, Evelene Croon, PA-C  Waylan Rocher, LPN See Dr Croitoru's note.  Can you ask him why he is on the Hytrin?  If for BP control, stop it and increase Coreg  If for prostate issues, ask him to see his PCP about a different med and I will send a staff message to his PCP about this.  Thanks  RGB  Pt states that he is taking Hytrin for prostate issues, pt states that he has appt next Friday with pcp, informed pt that he should stop ASAP and he should call ASAP. Pt verbalizes understanding Staff message sent updating rhonda for further instruction

## 2017-08-13 ENCOUNTER — Telehealth: Payer: Self-pay

## 2017-08-13 DIAGNOSIS — I429 Cardiomyopathy, unspecified: Secondary | ICD-10-CM

## 2017-08-13 NOTE — Telephone Encounter (Signed)
PER STAFF MESS FR:RB(9-10): Barrett, Evelene Croon, PA-C  Waylan Rocher, LPN-Please let him know his echo showed that his heart is still weak.  Spoke to [name], he recommends that we get a CAT scan of his heart to make sure he hasn't developed coronary artery disease.  After that, follow-up with [name].  We are concerned that a heart rhythm problem made him pass out.  It is important to follow-up with [name] to discuss procedures that might help him. Thanks RB  PT NOTIFIED-ADDRESS GAVE ADDRESS AND PHONE NUMBER-GI S/W GI-THEY DO NOT DO THIS CT  PT NOTIFIED THAT THIS WILL HAVE TO BE DONE AT [place]

## 2017-08-15 ENCOUNTER — Encounter: Payer: Self-pay | Admitting: Cardiovascular Disease

## 2017-08-15 DIAGNOSIS — N400 Enlarged prostate without lower urinary tract symptoms: Secondary | ICD-10-CM | POA: Diagnosis not present

## 2017-08-15 DIAGNOSIS — C61 Malignant neoplasm of prostate: Secondary | ICD-10-CM | POA: Diagnosis not present

## 2017-08-21 ENCOUNTER — Encounter (HOSPITAL_COMMUNITY): Payer: Self-pay

## 2017-08-21 ENCOUNTER — Telehealth: Payer: Self-pay | Admitting: Physician Assistant

## 2017-08-21 ENCOUNTER — Ambulatory Visit (HOSPITAL_COMMUNITY): Admission: RE | Admit: 2017-08-21 | Payer: Medicare HMO | Source: Ambulatory Visit

## 2017-08-21 ENCOUNTER — Ambulatory Visit (HOSPITAL_COMMUNITY)
Admission: RE | Admit: 2017-08-21 | Discharge: 2017-08-21 | Disposition: A | Discharge status: Home / Self Care | Payer: Medicare HMO | Source: Ambulatory Visit | Attending: Physician Assistant | Admitting: Physician Assistant

## 2017-08-21 DIAGNOSIS — I429 Cardiomyopathy, unspecified: Secondary | ICD-10-CM

## 2017-08-21 NOTE — Telephone Encounter (Signed)
New message    Chelsie from [name] CT department is calling. She said pt is there waiting and his creatine is elevated.

## 2017-08-21 NOTE — Telephone Encounter (Signed)
Chelsie from CT called requesting to speak with Rosaria Ferries, PA. Message was given to [facility] and called was placed.

## 2017-08-22 ENCOUNTER — Ambulatory Visit (INDEPENDENT_AMBULATORY_CARE_PROVIDER_SITE_OTHER): Payer: Medicare HMO | Admitting: Infectious Disease

## 2017-08-22 ENCOUNTER — Encounter: Payer: Self-pay | Admitting: Infectious Disease

## 2017-08-22 VITALS — BP 129/71 | HR 60 | Temp 97.6°F | Ht 74.0 in | Wt 214.0 lb

## 2017-08-22 DIAGNOSIS — B20 Human immunodeficiency virus [HIV] disease: Secondary | ICD-10-CM

## 2017-08-22 DIAGNOSIS — R110 Nausea: Secondary | ICD-10-CM

## 2017-08-22 DIAGNOSIS — R42 Dizziness and giddiness: Secondary | ICD-10-CM | POA: Diagnosis not present

## 2017-08-22 DIAGNOSIS — I420 Dilated cardiomyopathy: Secondary | ICD-10-CM | POA: Diagnosis not present

## 2017-08-22 DIAGNOSIS — A529 Late syphilis, unspecified: Secondary | ICD-10-CM

## 2017-08-22 DIAGNOSIS — N184 Chronic kidney disease, stage 4 (severe): Secondary | ICD-10-CM

## 2017-08-22 MED ORDER — EMTRICITABINE-TENOFOVIR AF 200-25 MG PO TABS: 1.0000 | ORAL_TABLET | Freq: Every day | ORAL | 11 refills | Status: DC

## 2017-08-22 MED ORDER — DOLUTEGRAVIR SODIUM 50 MG PO TABS: 50.0000 mg | ORAL_TABLET | Freq: Every day | ORAL | 11 refills | Status: DC

## 2017-08-22 NOTE — Progress Notes (Signed)
Chief complaint: nausea and dizziness  Subjective:    Patient ID: David [name], male    DOB: [birth year], 76 y.o.   MRN: [id number]  HPI  76 year old diagnosed with  HIV + man with AIDS in June 2015, prior syphilis and now acute on chronic kidney disease,  in whom we consolidated to [facility] and who has maintained nice virological suppression and CD4 that has risen above 300.  Due to his comorbid cardiomyopathy and chronic kidney disease we had some concern about ABACAVIR and gentle risk for cardiovascular disease and therefore we changed him to [facility]. He has maintained perfect control on [name] but is complaining of nausea and also lightheadedness. There could because some confounding here given the fact that he was also on terazosin  BPH but he still is having nausea and lightheadedness despite having this medication held. He would like to be changed back off of the [facility] and we will change him to [facility] and [place]   Lab Results  Component Value Date   HIV1RNAQUANT 56 (H) 08/03/2017   HIV1RNAQUANT <20 DETECTED (A) 05/22/2017   HIV1RNAQUANT 53 (H) 12/05/2016      Lab Results  Component Value Date   CD4TABS 350 (L) 08/03/2017   CD4TABS 320 (L) 05/22/2017   CD4TABS 350 (L) 12/05/2016     Past Medical History:  Diagnosis Date  . Anemia, iron deficiency 11/15/2011  . Arthritis   . Cancer [facility])    hx of prostate; s/p radioactive seed implant 10/2009 Dr Janice Norrie  . Chronic kidney disease   . Glaucoma   . Heart murmur    "mild" per pt  . History of cardiac cath 2004   negative for CAD  . History of myocardial perfusion scan 02/2010   negative for coronary insufficiency (LVEF 27%)  . HIV infection ([place])   . Hyperlipidemia   . Hypertension    followed by [facility] and Vascular (Dr Dani Gobble Croitoru)  . Nonischemic cardiomyopathy ([place])   . Sinus bradycardia   . Stroke [facility])    "mini stroke" years ago  . Systolic and diastolic CHF, acute ([place]) 06/2010   felt to be  secondary to hypertensive cardiomyopathy    Past Surgical History:  Procedure Laterality Date  . AV FISTULA PLACEMENT Right 10/13/2016   Procedure: ARTERIOVENOUS (AV) FISTULA CREATION;  Surgeon: [name], MD;  Location: [place];  Service: Vascular;  Laterality: Right;  . BASCILIC VEIN TRANSPOSITION Left 06/24/2015   Procedure: BASILIC VEIN TRANSPOSITION;  Surgeon: Mal Misty, MD;  Location: [place];  Service: Vascular;  Laterality: Left;  . CARDIAC CATHETERIZATION  02/04/2003   mildly depressed LV systolic fx EF 39%,JQBHAL coronaries/abdominal aorta/renal arteries.  . COLONOSCOPY    . ESOPHAGOGASTRODUODENOSCOPY (EGD) WITH PROPOFOL N/A 05/05/2014   Procedure: ESOPHAGOGASTRODUODENOSCOPY (EGD) WITH PROPOFOL;  Surgeon: Missy Sabins, MD;  [facility];  Service: Endoscopy;  Laterality: N/A;  . EYE SURGERY Bilateral    cataract surgery   . EYE SURGERY Bilateral    glaucoma surgery  . NM MYOCAR PERF WALL MOTION  02/21/2010   normal  . RADIOACTIVE SEED IMPLANT  2010   prostate cancer  . US ECHOCARDIOGRAPHY  02/06/2012   mild LVH,LA mod. dilated,mild-mod. 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 AI.    Family History  Problem Relation Age of Onset  . Hypertension Mother   . Thyroid disease Mother   . Cholelithiasis Daughter   . Cholelithiasis Son   . Hypertension  Maternal Grandmother   . Diabetes Maternal Grandmother   . Heart attack Neg Hx   . Hyperlipidemia Neg Hx       Social History   Social History  . Marital status: Married    Spouse name: N/A  . Number of children: 2  . Years of education: N/A   Occupational History  . retired, picks up Air traffic controller Retired   Social History Main Topics  . Smoking status: Former Smoker    Quit date: 12/04/2005  . Smokeless tobacco: Never Used  . Alcohol use Yes     Comment: once a week-wine  . Drug use: No  . Sexual activity: Not Asked   Other Topics Concern  . None   Social History Narrative    Stays active at home   Regular exercise: no    Allergies  Allergen Reactions  . Codeine Other (See Comments)    Causes constipation.   Blanchard Kelch [Losartan Potassium] Other (See Comments)    constipation     Current Outpatient Prescriptions:  .  acetaminophen (TYLENOL) 500 MG tablet, Take 500 mg by mouth every 4 (four) hours as needed for moderate pain or headache. , Disp: , Rfl:  .  amLODipine (NORVASC) 10 MG tablet, TAKE 1 TABLET EVERY DAY, Disp: 90 tablet, Rfl: 3 .  aspirin 325 MG EC tablet, Take 325 mg by mouth every evening. , Disp: , Rfl:  .  bictegravir-emtricitabine-tenofovir AF ([name]) 50-200-25 MG TABS tablet, Take 1 tablet by mouth daily., Disp: 30 tablet, Rfl: 11 .  brimonidine (ALPHAGAN) 0.15 % ophthalmic solution, Place 1 drop into the left eye 2 (two) times daily. , Disp: , Rfl: 1 .  carvedilol (COREG) 25 MG tablet, TAKE 1 TABLET TWICE DAILY, Disp: 180 tablet, Rfl: 3 .  dorzolamide-timolol (COSOPT) 22.3-6.8 MG/ML ophthalmic solution, Place 1 drop into the left eye 2 (two) times daily. , Disp: , Rfl:  .  latanoprost (XALATAN) 0.005 % ophthalmic solution, Place 1 drop into the left eye at bedtime. Use 1 drop in left eye at bedtime, Disp: , Rfl:  .  oxyCODONE-acetaminophen (PERCOCET/ROXICET) 5-325 MG tablet, Take 1 tablet by mouth every 6 (six) hours as needed., Disp: 6 tablet, Rfl: 0 .  ranitidine (ZANTAC) 150 MG tablet, Take 1 tablet (150 mg total) by mouth 2 (two) times daily., Disp: 60 tablet, Rfl: 2 .  Vitamin D, Ergocalciferol, (DRISDOL) 50000 UNITS CAPS capsule, TK ONE C PO Q  WEEK, Disp: , Rfl: 2 .  multivitamin (RENA-VIT) TABS tablet, Take 1 tablet by mouth daily., Disp: , Rfl: 3   Review of Systems  Constitutional: Positive for appetite change and fatigue. Negative for activity change, chills, fever and unexpected weight change.  HENT: Negative for congestion, rhinorrhea, sinus pressure, sneezing, sore throat and trouble swallowing.   Respiratory: Negative for  cough and shortness of breath.   Cardiovascular: Negative for chest pain, palpitations and leg swelling.  Gastrointestinal: Positive for nausea. Negative for abdominal distention, abdominal pain, constipation and vomiting.  Genitourinary: Negative for hematuria.  Musculoskeletal: Negative for myalgias.  Skin: Negative for color change, pallor, rash and wound.  Neurological: Positive for light-headedness. Negative for dizziness, tremors and weakness.  Hematological: Negative for adenopathy. Does not bruise/bleed easily.  Psychiatric/Behavioral: Negative for agitation, behavioral problems, confusion, decreased concentration and sleep disturbance.       Objective:   Physical Exam  Constitutional: He is oriented to person, place, and time. He appears well-developed and well-nourished. No distress.  HENT:  Head:  Normocephalic and atraumatic.  Mouth/Throat: Oropharynx is clear and moist. No oropharyngeal exudate.  Eyes: Conjunctivae and EOM are normal. No scleral icterus.  Neck: Normal range of motion. Neck supple.  Cardiovascular: Normal rate and regular rhythm.   Pulmonary/Chest: Effort normal and breath sounds normal. No respiratory distress.  Abdominal: Soft. Bowel sounds are normal. He exhibits no distension. There is no tenderness. There is no rebound.  Musculoskeletal: He exhibits no edema or tenderness.  Neurological: He is alert and oriented to person, place, and time. He has normal reflexes. He exhibits normal muscle tone. Coordination normal.  Skin: Skin is warm and dry. He is not diaphoretic. No erythema. No pallor.  Psychiatric: He has a normal mood and affect. His behavior is normal. Judgment and thought content normal.          Assessment & Plan:   HIV/AIDS:Due to problems with nausea that he attributes to [facility] will change him to  [facility] and DESCOVY and recheck labs in a month  Syphilis:titers down  CKD: followed by Dr Posey Pronto  HTN: followup with PCP, well  controlled   Vitals:   08/22/17 1042  BP: 129/71  Pulse: 60  Temp: 97.6 F (36.4 C)     NICM: Cardiology following  Nausea: possibly to this ARV though not a common SE  Dizziness: Suspect this is related to something else but we will change him off of [name]

## 2017-08-22 NOTE — Patient Instructions (Signed)
I am changing you to   [place] (one little yellow pill) DAILY PLUS  DESCOVY (one little blue pill ) DAILY  This takes the place of [name]  Come back and get blood work in 4 weeks and see me in 6 weeks to see if this change makes you feel better

## 2017-08-29 ENCOUNTER — Encounter: Payer: Self-pay | Admitting: Family

## 2017-08-29 ENCOUNTER — Ambulatory Visit (INDEPENDENT_AMBULATORY_CARE_PROVIDER_SITE_OTHER): Payer: Medicare HMO | Admitting: Family

## 2017-08-29 VITALS — BP 128/59 | HR 62 | Temp 98.2°F | Resp 16 | Ht 74.0 in | Wt 213.4 lb

## 2017-08-29 DIAGNOSIS — R400 Somnolence: Secondary | ICD-10-CM | POA: Diagnosis not present

## 2017-08-29 DIAGNOSIS — R269 Unspecified abnormalities of gait and mobility: Secondary | ICD-10-CM

## 2017-08-29 DIAGNOSIS — R55 Syncope and collapse: Secondary | ICD-10-CM | POA: Diagnosis not present

## 2017-08-29 NOTE — Progress Notes (Signed)
Subjective:    Patient ID: David [name], male    DOB: 09-07-1941, 76 y.o.   MRN: [id number]  HPI  David [name] presents today for follow up.  He came to see [place] on August 28 following an episode of syncope. CT of the head was negative for CVA or bleed. EKG that day noted sinus bradycardia and T-wave inversions in leads II, III and V6. This was unchanged compared to his prior EKG. He was referred back to cardiology. They advised the patient that his symptoms were concerning for cardiac origin. He was advised not to drive. They recommended either a loop recorder or an event monitor. Today he reports that he has been off of his Hytrin for 2 weeks but he continues to have dizziness in the mornings. He also reports that he has daytime sleepiness. He is up 3 times at night to urinate. He feels as though he is unable to sleep on his back due to sensation that this position is "cutting his hair off".  Since his last visit he denies any further syncope. He does have some a.m. dizziness. He also reports issues with balance and walking.  Review of Systems See HPI  Past Medical History:  Diagnosis Date  . Anemia, iron deficiency 11/15/2011  . Arthritis   . Cancer [facility])    hx of prostate; s/p radioactive seed implant 10/2009 Dr Janice Norrie  . Chronic kidney disease   . Glaucoma   . Heart murmur    "mild" per pt  . History of cardiac cath 2004   negative for CAD  . History of myocardial perfusion scan 02/2010   negative for coronary insufficiency (LVEF 27%)  . HIV infection ([place])   . Hyperlipidemia   . Hypertension    followed by [facility] and Vascular (Dr Dani Gobble Croitoru)  . Nonischemic cardiomyopathy ([place])   . Sinus bradycardia   . Stroke [facility])    "mini stroke" years ago  . Systolic and diastolic CHF, acute ([place]) 06/2010   felt to be secondary to hypertensive cardiomyopathy     Social History   Social History  . Marital status: Married    Spouse name: N/A  . Number of children: 2  .  Years of education: N/A   Occupational History  . retired, picks up Air traffic controller Retired   Social History Main Topics  . Smoking status: Former Smoker    Quit date: 12/04/2005  . Smokeless tobacco: Never Used  . Alcohol use Yes     Comment: once a week-wine  . Drug use: No  . Sexual activity: Not on file   Other Topics Concern  . Not on file   Social History Narrative   Stays active at home   Regular exercise: no    Past Surgical History:  Procedure Laterality Date  . AV FISTULA PLACEMENT Right 10/13/2016   Procedure: ARTERIOVENOUS (AV) FISTULA CREATION;  Surgeon: [name], MD;  Location: [facility];  Service: Vascular;  Laterality: Right;  . BASCILIC VEIN TRANSPOSITION Left 06/24/2015   Procedure: BASILIC VEIN TRANSPOSITION;  Surgeon: Mal Misty, MD;  Location: [facility];  Service: Vascular;  Laterality: Left;  . CARDIAC CATHETERIZATION  02/04/2003   mildly depressed LV systolic fx EF 01%,SWFUXN coronaries/abdominal aorta/renal arteries.  . COLONOSCOPY    . ESOPHAGOGASTRODUODENOSCOPY (EGD) WITH PROPOFOL N/A 05/05/2014   Procedure: ESOPHAGOGASTRODUODENOSCOPY (EGD) WITH PROPOFOL;  Surgeon: Missy Sabins, MD;  [facility];  Service: Endoscopy;  Laterality: N/A;  . EYE  SURGERY Bilateral    cataract surgery   . EYE SURGERY Bilateral    glaucoma surgery  . NM MYOCAR PERF WALL MOTION  02/21/2010   normal  . RADIOACTIVE SEED IMPLANT  2010   prostate cancer  . US ECHOCARDIOGRAPHY  02/06/2012   mild LVH,LA mod. dilated,mild-mod. 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 AI.    Family History  Problem Relation Age of Onset  . Hypertension Mother   . Thyroid disease Mother   . Cholelithiasis Daughter   . Cholelithiasis Son   . Hypertension Maternal Grandmother   . Diabetes Maternal Grandmother   . Heart attack Neg Hx   . Hyperlipidemia Neg Hx     Allergies  Allergen Reactions  . Codeine Other (See Comments)    Causes constipation.   Blanchard Kelch  [Losartan Potassium] Other (See Comments)    constipation    Current Outpatient Prescriptions on File Prior to Visit  Medication Sig Dispense Refill  . acetaminophen (TYLENOL) 500 MG tablet Take 500 mg by mouth every 4 (four) hours as needed for moderate pain or headache.     Marland Kitchen amLODipine (NORVASC) 10 MG tablet TAKE 1 TABLET EVERY DAY 90 tablet 3  . aspirin 325 MG EC tablet Take 325 mg by mouth every evening.     . brimonidine (ALPHAGAN) 0.15 % ophthalmic solution Place 1 drop into the left eye 2 (two) times daily.   1  . carvedilol (COREG) 25 MG tablet TAKE 1 TABLET TWICE DAILY 180 tablet 3  . dolutegravir (TIVICAY) 50 MG tablet Take 1 tablet (50 mg total) by mouth daily. 30 tablet 11  . dorzolamide-timolol (COSOPT) 22.3-6.8 MG/ML ophthalmic solution Place 1 drop into the left eye 2 (two) times daily.     Marland Kitchen emtricitabine-tenofovir AF (DESCOVY) 200-25 MG tablet Take 1 tablet by mouth daily. 30 tablet 11  . latanoprost (XALATAN) 0.005 % ophthalmic solution Place 1 drop into the left eye at bedtime. Use 1 drop in left eye at bedtime    . multivitamin (RENA-VIT) TABS tablet Take 1 tablet by mouth daily.  3  . oxyCODONE-acetaminophen (PERCOCET/ROXICET) 5-325 MG tablet Take 1 tablet by mouth every 6 (six) hours as needed. 6 tablet 0  . ranitidine (ZANTAC) 150 MG tablet Take 1 tablet (150 mg total) by mouth 2 (two) times daily. 60 tablet 2  . Vitamin D, Ergocalciferol, (DRISDOL) 50000 UNITS CAPS capsule TK ONE C PO Q  WEEK  2   No current facility-administered medications on file prior to visit.     BP (!) 128/59 (BP Location: Left Arm, Cuff Size: Normal)   Pulse 62   Temp 98.2 F (36.8 C) (Oral)   Resp 16   Ht 6\' 2"  (1.88 m)   Wt 213 lb 6.4 oz (96.8 kg)   SpO2 100%   BMI 27.40 kg/m       Objective:   Physical Exam  Constitutional: He is oriented to person, place, and time. He appears well-developed and well-nourished.  Tired appearing male in NAD  HENT:  Head: Normocephalic and  atraumatic.  Cardiovascular: Normal rate and regular rhythm.   No murmur heard. Pulmonary/Chest: Effort normal and breath sounds normal. No respiratory distress. He has no wheezes. He has no rales.  Musculoskeletal: He exhibits no edema.  Neurological: He is alert and oriented to person, place, and time.  Unsteady slightly wide-based gait.  Skin: Skin is warm and dry.  Psychiatric: He has a normal mood and affect.  His behavior is normal. Thought content normal.          Assessment & Plan:  Gait disorder-will refer to neurology for further evaluation due to complaint of dizziness and his gait disorder. I am concerned about the possibility of Parkinson's disease. Also concerned about possibility of some neuropathy related to his history of HIV/AIDS.  Daytime somnolence-will refer for sleep study for further evaluation. I'm suspicious about possibility of obstructive sleep apnea.  Syncope-he has had no further syncope since last visit. I've advised him per cardiology recommendations not to drive and also to follow through with cardiac recommendations for ongoing workup.

## 2017-08-29 NOTE — Patient Instructions (Signed)
You will be contacted about your referral for sleep study to check you for sleep apnea due to your sleepiness. Do not drive until you are cleared by cardiology.

## 2017-09-03 ENCOUNTER — Encounter: Payer: Self-pay | Admitting: Neurology

## 2017-09-03 ENCOUNTER — Telehealth: Payer: Self-pay | Admitting: Family

## 2017-09-03 DIAGNOSIS — R42 Dizziness and giddiness: Secondary | ICD-10-CM

## 2017-09-03 DIAGNOSIS — H81399 Other peripheral vertigo, unspecified ear: Secondary | ICD-10-CM

## 2017-09-03 NOTE — Telephone Encounter (Signed)
Notified pt and he is agreeable to proceed with MRI. Order signed. Advised pt he will be contacted to schedule the appointment once approval is obtained with his insurance.

## 2017-09-03 NOTE — Telephone Encounter (Signed)
Please contact pt and let him know that I gave some more thought to his walking difficulties and dizziness and would like for him to complete an MRI of his brain.  Order is pended.

## 2017-09-06 ENCOUNTER — Telehealth: Payer: Self-pay | Admitting: Family

## 2017-09-06 NOTE — Telephone Encounter (Signed)
Pt dropped off document to be filled out by provider (Disability Parking Placard - 1 page) Pt would like document to be mailed to him when document ready. Document put at front office tray.

## 2017-09-10 NOTE — Telephone Encounter (Signed)
Received Application and Renewal of Disability Parking Placard form [place] [employer]; patient will need to sign, forward to provider/SLS 10/08

## 2017-09-14 ENCOUNTER — Encounter: Payer: Self-pay | Admitting: Cardiovascular Disease

## 2017-09-14 ENCOUNTER — Ambulatory Visit (INDEPENDENT_AMBULATORY_CARE_PROVIDER_SITE_OTHER): Payer: Medicare HMO | Admitting: Cardiovascular Disease

## 2017-09-14 VITALS — BP 140/78 | HR 71 | Ht 74.0 in | Wt 215.0 lb

## 2017-09-14 DIAGNOSIS — I5042 Chronic combined systolic (congestive) and diastolic (congestive) heart failure: Secondary | ICD-10-CM

## 2017-09-14 DIAGNOSIS — B20 Human immunodeficiency virus [HIV] disease: Secondary | ICD-10-CM

## 2017-09-14 DIAGNOSIS — N184 Chronic kidney disease, stage 4 (severe): Secondary | ICD-10-CM

## 2017-09-14 DIAGNOSIS — I10 Essential (primary) hypertension: Secondary | ICD-10-CM | POA: Diagnosis not present

## 2017-09-14 DIAGNOSIS — E7800 Pure hypercholesterolemia, unspecified: Secondary | ICD-10-CM | POA: Diagnosis not present

## 2017-09-14 DIAGNOSIS — I447 Left bundle-branch block, unspecified: Secondary | ICD-10-CM | POA: Diagnosis not present

## 2017-09-14 DIAGNOSIS — Z79899 Other long term (current) drug therapy: Secondary | ICD-10-CM | POA: Diagnosis not present

## 2017-09-14 MED ORDER — FUROSEMIDE 40 MG PO TABS: 80.0000 mg | ORAL_TABLET | Freq: Every day | ORAL | 3 refills | Status: DC

## 2017-09-14 NOTE — Telephone Encounter (Signed)
Patient returned call and will arrive at 12:30 or 1pm to sign document mentioned below, please leave at front desk with instructions.

## 2017-09-14 NOTE — Patient Instructions (Signed)
Dr Sallyanne Kuster has recommended making the following medication changes: 1. INCREASE Furosemide to 80 mg daily  Your physician recommends that you return for lab work in 1 week.  Dr Sallyanne Kuster recommends that you schedule a follow-up appointment in 12 months. You will receive a reminder letter in the mail two months in advance. If you don't receive a letter, please call our office to schedule the follow-up appointment.  If you need a refill on your cardiac medications before your next appointment, please call your pharmacy.

## 2017-09-14 NOTE — Progress Notes (Signed)
Patient ID: David [name], male   DOB: 1941/01/20, 76 y.o.   MRN: [id number]    Cardiology Office Note    Date:  09/14/2017   ID:  David [name], DOB Jul 21, 1941, MRN [id number]  PCP:  David Alar, NP  Cardiologist:   David Klein, MD   Chief Complaint  Patient presents with  . Follow-up    6-7 weeks  . Fatigue    History of Present Illness:  David [name] is a 76 y.o. male Hypertension, nonischemic dilated cardiomyopathy and, chronic kidney disease  David [name] has a long-standing history of nonischemic cardiomyopathy (normal coronary angio 2004) with September 2018 echo showing EF has dropped again to 25-30%.  As before he is fairly sedentary, and does not complain of shortness of breath much. However he becomes dyspneic if he tries jogging or during sexual intercourse. He noticed that he starts coughing with these activities. He was started on furosemide 40 mg daily by David [name] 2-3 months ago. He is on high-dose carvedilol, but is unable to take RAAS inhibitors due to advanced chronic kidney disease. His ECG shows QRS widening 132 ms with a left bundle branch block pattern.  He has orthostatic dizziness when he gets out of bed first thing in the morning. This has improved after stopping Hytrin. He has not had problems with urinary retention or hesitancy.  The patient specifically denies any chest pain at rest or with exertion, dyspnea at rest, orthopnea, paroxysmal nocturnal dyspnea, syncope, palpitations, focal neurological deficits, intermittent claudication, lower extremity edema, unexplained weight gain, cough, hemoptysis or wheezing.  Two attempts at creating an AV fistula had failed. The plan is for him to eventually get an AV graft. He does not have dialysis access at this time.  He has HIV-AIDS, diagnosed only when his CD4 had already dropped to 61 and he was losing weight. After treatment he had undetectable viral load in June 2018. Repeat assay in August showed  detectable virus and his medications have been adjusted..  Past Medical History:  Diagnosis Date  . Anemia, iron deficiency 11/15/2011  . Arthritis   . Cancer [facility])    hx of prostate; s/p radioactive seed implant 10/2009 David [name]  . Chronic kidney disease   . Glaucoma   . Heart murmur    "mild" per pt  . History of cardiac cath 2004   negative for CAD  . History of myocardial perfusion scan 02/2010   negative for coronary insufficiency (LVEF 27%)  . HIV infection ([place])   . Hyperlipidemia   . Hypertension    followed by [facility] and Vascular (David [name])  . Nonischemic cardiomyopathy ([place])   . Sinus bradycardia   . Stroke [facility])    "mini stroke" years ago  . Systolic and diastolic CHF, acute ([place]) 06/2010   felt to be secondary to hypertensive cardiomyopathy    Past Surgical History:  Procedure Laterality Date  . AV FISTULA PLACEMENT Right 10/13/2016   Procedure: ARTERIOVENOUS (AV) FISTULA CREATION;  Surgeon: David Posner, MD;  Location: [facility];  Service: Vascular;  Laterality: Right;  . BASCILIC VEIN TRANSPOSITION Left 06/24/2015   Procedure: BASILIC VEIN TRANSPOSITION;  Surgeon: David Misty, MD;  Location: [place];  Service: Vascular;  Laterality: Left;  . CARDIAC CATHETERIZATION  02/04/2003   mildly depressed LV systolic fx EF 85%,IDPOEU coronaries/abdominal aorta/renal arteries.  . COLONOSCOPY    . ESOPHAGOGASTRODUODENOSCOPY (EGD) WITH PROPOFOL N/A 05/05/2014   Procedure: ESOPHAGOGASTRODUODENOSCOPY (EGD) WITH PROPOFOL;  Surgeon: David Sabins, MD;  Location: [facility];  Service: Endoscopy;  Laterality: N/A;  . EYE SURGERY Bilateral    cataract surgery   . EYE SURGERY Bilateral    glaucoma surgery  . NM MYOCAR PERF WALL MOTION  02/21/2010   normal  . RADIOACTIVE SEED IMPLANT  2010   prostate cancer  . US ECHOCARDIOGRAPHY  02/06/2012   mild LVH,LA mod. dilated,mild-mod. 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 AI.    Current  Outpatient Prescriptions  Medication Sig Dispense Refill  . acetaminophen (TYLENOL) 500 MG tablet Take 500 mg by mouth every 4 (four) hours as needed for moderate pain or headache.     Marland Kitchen amLODipine (NORVASC) 10 MG tablet TAKE 1 TABLET EVERY DAY 90 tablet 3  . aspirin 325 MG EC tablet Take 325 mg by mouth every evening.     Marland Kitchen [name] 50-200-25 MG TABS tablet Take 1 tablet by mouth daily.    . brimonidine (ALPHAGAN) 0.15 % ophthalmic solution Place 1 drop into the left eye 2 (two) times daily.   1  . carvedilol (COREG) 25 MG tablet TAKE 1 TABLET TWICE DAILY 180 tablet 3  . dolutegravir (TIVICAY) 50 MG tablet Take 1 tablet (50 mg total) by mouth daily. 30 tablet 11  . dorzolamide-timolol (COSOPT) 22.3-6.8 MG/ML ophthalmic solution Place 1 drop into the left eye 2 (two) times daily.     Marland Kitchen emtricitabine-tenofovir AF (DESCOVY) 200-25 MG tablet Take 1 tablet by mouth daily. 30 tablet 11  . latanoprost (XALATAN) 0.005 % ophthalmic solution Place 1 drop into the left eye at bedtime. Use 1 drop in left eye at bedtime    . multivitamin (RENA-VIT) TABS tablet Take 1 tablet by mouth daily.  3  . oxyCODONE-acetaminophen (PERCOCET/ROXICET) 5-325 MG tablet Take 1 tablet by mouth every 6 (six) hours as needed. 6 tablet 0  . ranitidine (ZANTAC) 150 MG tablet Take 1 tablet (150 mg total) by mouth 2 (two) times daily. 60 tablet 2  . Vitamin D, Ergocalciferol, (DRISDOL) 50000 UNITS CAPS capsule TK ONE C PO Q  WEEK  2  . furosemide (LASIX) 40 MG tablet Take 2 tablets (80 mg total) by mouth daily. 180 tablet 3   No current facility-administered medications for this visit.     Allergies:   Codeine and Cozaar [losartan potassium]   Social History   Social History  . Marital status: Married    Spouse name: N/A  . Number of children: 2  . Years of education: N/A   Occupational History  . retired, picks up Air traffic controller Retired   Social History Main Topics  . Smoking status: Former Smoker    Quit date:  12/04/2005  . Smokeless tobacco: Never Used  . Alcohol use Yes     Comment: once a week-wine  . Drug use: No  . Sexual activity: Not Asked   Other Topics Concern  . None   Social History Narrative   Stays active at home   Regular exercise: no     Family History:  The patient's family history includes Cholelithiasis in his daughter and son; Diabetes in his maternal grandmother; Hypertension in his maternal grandmother and mother; Thyroid disease in his mother.   ROS:   Please see the history of present illness.    Review of Systems  All other systems reviewed and are negative.    PHYSICAL EXAM:   VS:  BP 140/78   Pulse 71   Ht  6\' 2"  (1.88 m)   Wt 215 lb (97.5 kg)   BMI 27.60 kg/m     General: Alert, oriented x3, no distress, Overweight Head: no evidence of trauma, PERRL, EOMI, no exophtalmos or lid lag, no myxedema, no xanthelasma; normal ears, nose and oropharynx Neck: normal jugular venous pulsations and no hepatojugular reflux; brisk carotid pulses without delay and no carotid bruits Chest: clear to auscultation, no signs of consolidation by percussion or palpation, normal fremitus, symmetrical and full respiratory excursions Cardiovascular: Laterally displaced apical impulse, regular rhythm, normal first and paradoxically split second heart sounds, no murmurs, rubs or gallops Abdomen: no tenderness or distention, no masses by palpation, no abnormal pulsatility or arterial bruits, normal bowel sounds, no hepatosplenomegaly Extremities: no clubbing, cyanosis or edema; 2+ radial, ulnar and brachial pulses bilaterally; 2+ right femoral, posterior tibial and dorsalis pedis pulses; 2+ left femoral, posterior tibial and dorsalis pedis pulses; no subclavian or femoral bruits Neurological: grossly nonfocal, his responses are a little slow, but appropriate Psych: Normal mood and affect   Wt Readings from Last 3 Encounters:  09/14/17 215 lb (97.5 kg)  08/29/17 213 lb 6.4 oz (96.8  kg)  08/22/17 214 lb (97.1 kg)      Studies/Labs Reviewed:   EKG:  EKG is ordered today.  The ekg ordered today demonstrates Normal sinus rhythm with a couple of PVCs, mild first-degree AV block (210 ms his QRS which had a pattern of LVH is now even broader at 132 ms with a left bundle branch block morphology and left axis deviation. QTC 462 ms  Recent Labs: 07/31/2017: ALT 18; BUN 43; Creatinine, Ser 3.81; Hemoglobin 12.0; Platelets 218.0; Potassium 4.6; Sodium 142; TSH 1.10   Lipid Panel    Component Value Date/Time   CHOL 154 05/22/2017 0926   TRIG 62 05/22/2017 0926   HDL 47 05/22/2017 0926   CHOLHDL 3.3 05/22/2017 0926   VLDL 12 05/22/2017 0926   LDLCALC 95 05/22/2017 0926     ASSESSMENT:    1. Chronic combined systolic and diastolic heart failure ([place])   2. Essential hypertension   3. CKD (chronic kidney disease) stage 4, GFR 15-29 ml/min (HCC)   4. Pure hypercholesterolemia   5. LBBB (left bundle branch block)   6. AIDS ([place])   7. Medication management      PLAN:  In order of problems listed above:  1. CHF: Left ventricular systolic function has deteriorated again. There is no angina pectoris on history but he does have symptoms of worsening heart failure, NYHA functional class II. Will increase his loop diuretic to 80 mg daily. He is scheduled to see his nephrologist, David [name], next week and will have labs drawn at that time. It will likely become increasingly difficult to maintain the balance between compensation of heart failure and renal failure. Despite numerous coronary risk factors, when his cardiomyopathy began he did not have any evidence of coronary disease. He does not have angina or EKG changes to support ischemia. I don't think the risk of nephrotoxicity allows [place] to perform coronary angiography safely. For the same reason he is unable to take ACE inhibitor/ARB/Entresto. 2. HTN: well controlled - presumed to be primary etiology for his  cardiomyopathy 3. CKD: Approaching ESRD, needs HD access 4. HLP: Even though he is receiving protease inhibitors he has a favorable lipid profile 5. LBBB: Option for CRT-D therapy, but this will also involve contrast exposure and risk of speeding up the progression to end-stage renal disease. Although he  does have a typical left bundle branch block pattern, his QRS is really not that broad. Overall likely to respond to CRT, will discuss this if diuretic therapy fails to control his symptoms. 6. HIV: Controlled, followed by David. Tommy Medal.  Medication Adjustments/Labs and Tests Ordered: Current medicines are reviewed at length with the patient today.  Concerns regarding medicines are outlined above.  Medication changes, Labs and Tests ordered today are listed below. Patient Instructions  David Sallyanne Kuster has recommended making the following medication changes: 1. INCREASE Furosemide to 80 mg daily  Your physician recommends that you return for lab work in 1 week.  David Sallyanne Kuster recommends that you schedule a follow-up appointment in 12 months. You will receive a reminder letter in the mail two months in advance. If you don't receive a letter, please call our office to schedule the follow-up appointment.  If you need a refill on your cardiac medications before your next appointment, please call your pharmacy.     Signed, David Klein, MD  09/14/2017 1:19 PM    [facility] [name], [place], [place]  [address] Phone: [phone]; Fax: [phone]

## 2017-09-14 NOTE — Telephone Encounter (Signed)
Gave document to Tiffany; she will make copy for me after pt signs/SLS 10/12

## 2017-09-14 NOTE — Telephone Encounter (Signed)
LMOM with contact name and number [for return call, if needed] RE: need to come in to office and sign applicant section of Disability Parking Placard application/SLS 73/22

## 2017-09-18 DIAGNOSIS — N189 Chronic kidney disease, unspecified: Secondary | ICD-10-CM | POA: Diagnosis not present

## 2017-09-18 DIAGNOSIS — N184 Chronic kidney disease, stage 4 (severe): Secondary | ICD-10-CM | POA: Diagnosis not present

## 2017-09-18 DIAGNOSIS — N2581 Secondary hyperparathyroidism of renal origin: Secondary | ICD-10-CM | POA: Diagnosis not present

## 2017-09-18 DIAGNOSIS — N181 Chronic kidney disease, stage 1: Secondary | ICD-10-CM | POA: Diagnosis not present

## 2017-09-19 ENCOUNTER — Other Ambulatory Visit: Payer: Medicare HMO

## 2017-09-19 DIAGNOSIS — B20 Human immunodeficiency virus [HIV] disease: Secondary | ICD-10-CM | POA: Diagnosis not present

## 2017-09-19 DIAGNOSIS — G4733 Obstructive sleep apnea (adult) (pediatric): Secondary | ICD-10-CM | POA: Diagnosis not present

## 2017-09-20 LAB — T-HELPER CELL (CD4) - (RCID CLINIC ONLY)
CD4 % Helper T Cell: 21 % — ABNORMAL LOW (ref 33–55)
CD4 T Cell Abs: 310 /uL — ABNORMAL LOW (ref 400–2700)

## 2017-09-21 LAB — HIV-1 RNA QUANT-NO REFLEX-BLD
HIV 1 RNA QUANT: NOT DETECTED {copies}/mL
HIV-1 RNA QUANT, LOG: NOT DETECTED {Log_copies}/mL

## 2017-09-27 DIAGNOSIS — I129 Hypertensive chronic kidney disease with stage 1 through stage 4 chronic kidney disease, or unspecified chronic kidney disease: Secondary | ICD-10-CM | POA: Diagnosis not present

## 2017-09-27 DIAGNOSIS — D631 Anemia in chronic kidney disease: Secondary | ICD-10-CM | POA: Diagnosis not present

## 2017-09-27 DIAGNOSIS — G4733 Obstructive sleep apnea (adult) (pediatric): Secondary | ICD-10-CM | POA: Diagnosis not present

## 2017-09-27 DIAGNOSIS — N2581 Secondary hyperparathyroidism of renal origin: Secondary | ICD-10-CM | POA: Diagnosis not present

## 2017-09-27 DIAGNOSIS — N184 Chronic kidney disease, stage 4 (severe): Secondary | ICD-10-CM | POA: Diagnosis not present

## 2017-09-28 ENCOUNTER — Telehealth: Payer: Self-pay | Admitting: Family

## 2017-09-28 DIAGNOSIS — G4733 Obstructive sleep apnea (adult) (pediatric): Secondary | ICD-10-CM

## 2017-09-28 NOTE — Telephone Encounter (Signed)
Called patient . States it will be ok for referral for CPAP.

## 2017-09-28 NOTE — Telephone Encounter (Signed)
Please contact patient and let him know that his sleep study confirms sleep apnea. I would like to arrange home cpap (referral pended). I will need to see him back in the office 3 weeks after he starts the CPAP.

## 2017-10-02 ENCOUNTER — Other Ambulatory Visit: Payer: Self-pay | Admitting: *Deleted

## 2017-10-02 DIAGNOSIS — R400 Somnolence: Secondary | ICD-10-CM

## 2017-10-02 NOTE — Telephone Encounter (Signed)
David [name]-- home health is requesting copy of sleep study. We are unable to see sleep study in EPIC. Spoke with David [name] at pulmonology and she states she cannot see record that pt has been seen at their office or that study was completed. We are able to see under appt desk and orders that it was scheduled on 09/19/17 at 4pm.  Do you ostill have a copy of the note that was sent directly to you?

## 2017-10-03 ENCOUNTER — Telehealth: Payer: Self-pay | Admitting: Family

## 2017-10-03 ENCOUNTER — Ambulatory Visit (INDEPENDENT_AMBULATORY_CARE_PROVIDER_SITE_OTHER): Payer: Medicare HMO | Admitting: Infectious Disease

## 2017-10-03 VITALS — BP 120/70 | HR 66 | Temp 97.6°F | Wt 211.0 lb

## 2017-10-03 DIAGNOSIS — B20 Human immunodeficiency virus [HIV] disease: Secondary | ICD-10-CM

## 2017-10-03 DIAGNOSIS — I420 Dilated cardiomyopathy: Secondary | ICD-10-CM

## 2017-10-03 DIAGNOSIS — A529 Late syphilis, unspecified: Secondary | ICD-10-CM | POA: Diagnosis not present

## 2017-10-03 DIAGNOSIS — R42 Dizziness and giddiness: Secondary | ICD-10-CM

## 2017-10-03 DIAGNOSIS — N184 Chronic kidney disease, stage 4 (severe): Secondary | ICD-10-CM

## 2017-10-03 DIAGNOSIS — Z23 Encounter for immunization: Secondary | ICD-10-CM | POA: Diagnosis not present

## 2017-10-03 DIAGNOSIS — I5042 Chronic combined systolic (congestive) and diastolic (congestive) heart failure: Secondary | ICD-10-CM | POA: Diagnosis not present

## 2017-10-03 NOTE — Telephone Encounter (Signed)
Yes, I converted to a phone note.  See phone note please.

## 2017-10-03 NOTE — Progress Notes (Signed)
Chief complaint: followup for HIV  Subjective:    Patient ID: David [name], male    DOB: [birth year], 76 y.o.   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  Due to his comorbid cardiomyopathy and chronic kidney disease we had some concern about ABACAVIR and gentle risk for cardiovascular disease and therefore we changed him to [facility]. He has maintained perfect control on [name] but is complaining of nausea and also lightheadedness. There could because some confounding here given the fact that he was also on terazosin  BPH but he still is having nausea and lightheadedness despite having this medication held. He would like to be changed back off of the [name] and we changed  him to [name].  He has tolerated these just fine but preferred being on one pill.   His viral load is undetectable and CD4 healthy   Lab Results  Component Value Date   HIV1RNAQUANT <20 NOT DETECTED 09/19/2017   HIV1RNAQUANT 56 (H) 08/03/2017   HIV1RNAQUANT <20 DETECTED (A) 05/22/2017      Lab Results  Component Value Date   CD4TABS 310 (L) 09/19/2017   CD4TABS 350 (L) 08/03/2017   CD4TABS 320 (L) 05/22/2017     Past Medical History:  Diagnosis Date  . Anemia, iron deficiency 11/15/2011  . Arthritis   . Cancer [facility])    hx of prostate; s/p radioactive seed implant 10/2009 Dr David [name]  . Chronic kidney disease   . Glaucoma   . Heart murmur    "mild" per pt  . History of cardiac cath 2004   negative for CAD  . History of myocardial perfusion scan 02/2010   negative for coronary insufficiency (LVEF 27%)  . HIV infection ([place])   . Hyperlipidemia   . Hypertension    followed by [facility] and Vascular (Dr David [name])  . Nonischemic cardiomyopathy ([place])   . Sinus  bradycardia   . Stroke [facility])    "mini stroke" years ago  . Systolic and diastolic CHF, acute ([place]) 06/2010   felt to be secondary to hypertensive cardiomyopathy    Past Surgical History:  Procedure Laterality Date  . AV FISTULA PLACEMENT Right 10/13/2016   Procedure: ARTERIOVENOUS (AV) FISTULA CREATION;  Surgeon: David Posner, MD;  Location: [place];  Service: Vascular;  Laterality: Right;  . BASCILIC VEIN TRANSPOSITION Left 06/24/2015   Procedure: BASILIC VEIN TRANSPOSITION;  Surgeon: David Misty, MD;  Location: [place];  Service: Vascular;  Laterality: Left;  . CARDIAC CATHETERIZATION  02/04/2003   mildly depressed LV systolic fx EF 10%,David [name] coronaries/abdominal aorta/renal arteries.  . COLONOSCOPY    . ESOPHAGOGASTRODUODENOSCOPY (EGD) WITH PROPOFOL N/A 05/05/2014   Procedure: ESOPHAGOGASTRODUODENOSCOPY (EGD) WITH PROPOFOL;  Surgeon: David Sabins, MD;  [facility];  Service: Endoscopy;  Laterality: N/A;  . EYE SURGERY Bilateral    cataract surgery   . EYE SURGERY Bilateral    glaucoma surgery  . NM MYOCAR PERF WALL MOTION  02/21/2010   normal  . RADIOACTIVE SEED IMPLANT  2010   prostate cancer  . US ECHOCARDIOGRAPHY  02/06/2012   mild LVH,LA mod. dilated,mild-mod. 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 AI.    Family History  Problem Relation Age of Onset  .  Hypertension Mother   . Thyroid disease Mother   . Cholelithiasis Daughter   . Cholelithiasis Son   . Hypertension Maternal Grandmother   . Diabetes Maternal Grandmother   . Heart attack Neg Hx   . Hyperlipidemia Neg Hx       Social History   Social History  . Marital status: Married    Spouse name: N/A  . Number of children: 2  . Years of education: N/A   Occupational History  . retired, picks up Air traffic controller Retired   Social History Main Topics  . Smoking status: Former Smoker    Quit date: 12/04/2005  . Smokeless tobacco: Never Used  . Alcohol use Yes     Comment: once a  week-wine  . Drug use: No  . Sexual activity: Not on file   Other Topics Concern  . Not on file   Social History Narrative   Stays active at home   Regular exercise: no    Allergies  Allergen Reactions  . Codeine Other (See Comments)    Causes constipation.   Blanchard Kelch [Losartan Potassium] Other (See Comments)    constipation     Current Outpatient Prescriptions:  .  acetaminophen (TYLENOL) 500 MG tablet, Take 500 mg by mouth every 4 (four) hours as needed for moderate pain or headache. , Disp: , Rfl:  .  amLODipine (NORVASC) 10 MG tablet, TAKE 1 TABLET EVERY DAY, Disp: 90 tablet, Rfl: 3 .  aspirin 325 MG EC tablet, Take 325 mg by mouth every evening. , Disp: , Rfl:  .  [name] 50-200-25 MG TABS tablet, Take 1 tablet by mouth daily., Disp: , Rfl:  .  brimonidine (ALPHAGAN) 0.15 % ophthalmic solution, Place 1 drop into the left eye 2 (two) times daily. , Disp: , Rfl: 1 .  carvedilol (COREG) 25 MG tablet, TAKE 1 TABLET TWICE DAILY, Disp: 180 tablet, Rfl: 3 .  dolutegravir (TIVICAY) 50 MG tablet, Take 1 tablet (50 mg total) by mouth daily., Disp: 30 tablet, Rfl: 11 .  dorzolamide-timolol (COSOPT) 22.3-6.8 MG/ML ophthalmic solution, Place 1 drop into the left eye 2 (two) times daily. , Disp: , Rfl:  .  emtricitabine-tenofovir AF (DESCOVY) 200-25 MG tablet, Take 1 tablet by mouth daily., Disp: 30 tablet, Rfl: 11 .  furosemide (LASIX) 40 MG tablet, Take 2 tablets (80 mg total) by mouth daily., Disp: 180 tablet, Rfl: 3 .  latanoprost (XALATAN) 0.005 % ophthalmic solution, Place 1 drop into the left eye at bedtime. Use 1 drop in left eye at bedtime, Disp: , Rfl:  .  oxyCODONE-acetaminophen (PERCOCET/ROXICET) 5-325 MG tablet, Take 1 tablet by mouth every 6 (six) hours as needed., Disp: 6 tablet, Rfl: 0 .  ranitidine (ZANTAC) 150 MG tablet, Take 1 tablet (150 mg total) by mouth 2 (two) times daily., Disp: 60 tablet, Rfl: 2 .  Vitamin D, Ergocalciferol, (DRISDOL) 50000 UNITS CAPS capsule, TK  ONE C PO Q  WEEK, Disp: , Rfl: 2 .  multivitamin (RENA-VIT) TABS tablet, Take 1 tablet by mouth daily., Disp: , Rfl: 3   Review of Systems  Constitutional: Negative for activity change, appetite change, chills, fatigue, fever and unexpected weight change.  HENT: Negative for congestion, rhinorrhea, sinus pressure, sneezing, sore throat and trouble swallowing.   Respiratory: Negative for cough and shortness of breath.   Cardiovascular: Negative for chest pain, palpitations and leg swelling.  Gastrointestinal: Negative for abdominal distention, abdominal pain, constipation, nausea and vomiting.  Genitourinary: Negative for hematuria.  Musculoskeletal:  Negative for myalgias.  Skin: Negative for color change, pallor, rash and wound.  Neurological: Negative for dizziness, tremors, weakness and light-headedness.  Hematological: Negative for adenopathy. Does not bruise/bleed easily.  Psychiatric/Behavioral: Negative for agitation, behavioral problems, confusion, decreased concentration and sleep disturbance.       Objective:   Physical Exam  Constitutional: He is oriented to person, place, and time. He appears well-developed and well-nourished. No distress.  HENT:  Head: Normocephalic and atraumatic.  Mouth/Throat: Oropharynx is clear and moist. No oropharyngeal exudate.  Eyes: Conjunctivae and EOM are normal. No scleral icterus.  Neck: Normal range of motion. Neck supple.  Cardiovascular: Normal rate and regular rhythm.   Pulmonary/Chest: Effort normal and breath sounds normal. No respiratory distress.  Abdominal: Soft. Bowel sounds are normal. He exhibits no distension. There is no tenderness. There is no rebound.  Musculoskeletal: He exhibits no edema or tenderness.  Neurological: He is alert and oriented to person, place, and time. He has normal reflexes. He exhibits normal muscle tone. Coordination normal.  Skin: Skin is warm and dry. He is not diaphoretic. No erythema. No pallor.    Psychiatric: He has a normal mood and affect. His behavior is normal. Judgment and thought content normal.          Assessment & Plan:   HIV/AIDS:Due to problems with nausea that he attributes to [facility] changed to  [facility] and DESCOVY and doing well. Willl see him back in January when he needs to do SPAP again  Could consider JULUCA OR when DTG-3TC comes out going to this one pill once a day. I worry about the food requirement and avoidacne of antacids with RPV  Syphilis:titers down  CKD: followed by Dr Posey Pronto  HTN: followup with PCP, well controlled   Vitals:   10/03/17 0935  BP: 120/70  Pulse: 66  Temp: 97.6 F (36.4 C)     NICM: Cardiology following  Nausea: possibly to this ARV better now

## 2017-10-03 NOTE — Telephone Encounter (Signed)
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  Study should be scanned into Epic directly.  Thanks.  [place]

## 2017-10-05 ENCOUNTER — Telehealth: Payer: Self-pay

## 2017-10-05 ENCOUNTER — Encounter: Payer: Self-pay | Admitting: Neurology

## 2017-10-05 ENCOUNTER — Ambulatory Visit (INDEPENDENT_AMBULATORY_CARE_PROVIDER_SITE_OTHER): Payer: Medicare HMO | Admitting: Neurology

## 2017-10-05 VITALS — BP 108/72 | HR 64 | Ht 74.0 in | Wt 212.8 lb

## 2017-10-05 DIAGNOSIS — R42 Dizziness and giddiness: Secondary | ICD-10-CM | POA: Diagnosis not present

## 2017-10-05 DIAGNOSIS — R2681 Unsteadiness on feet: Secondary | ICD-10-CM | POA: Diagnosis not present

## 2017-10-05 DIAGNOSIS — R292 Abnormal reflex: Secondary | ICD-10-CM

## 2017-10-05 DIAGNOSIS — R55 Syncope and collapse: Secondary | ICD-10-CM

## 2017-10-05 NOTE — Progress Notes (Signed)
NEUROLOGY CONSULTATION NOTE  SAVAS ELVIN MRN: [id number] DOB: August 18, 1941  Referring provider: [name], NP Primary care provider: [name], NP  Reason for consult:  Gait disorder  HISTORY OF PRESENT ILLNESS: David [name] is a 76 year old right-handed male with hypertension, hyperlipidemia, nonsichemic cardiomyopathy, AIDS, CKD, arthritis and history of prostate cancer status post radioactive seed implant 2010 and TIA who presents for gait disorder.  History supplemented by PCP note.  In August, he had an episode of syncope.  He was standing having just lifted a water heater onto the back of his truck.  The next thing he remembers was laying on the ground.  He felt fine.  There was no preceding dizziness.  He had not bit his tongue or had incontinence.  CT of head from 07/31/17 was personally reviewed and was negative for acute stroke.  EKG demonstrated sinus bradycardia of 54 bpm and T-wave inversions which were unchanged compared to prior EKG.  Echocardiogram from 08/08/17 demonstrated EF 25-30%.  Following this event, he began to feel dizzy.  Initially, it would occur when turning in bed, standing up or sitting down too fast.  Now, it occurs briefly when he first stands up from bed.  It is an undulating feeling but not a spinning sensation.  It lasts no more than a couple of minutes.  There was initially some nausea, but that has since resolved.  There is no associated double vision, trouble swallowing, slurred speech or unilateral numbness or weakness.  He has trouble walking due to left hip pain.  He denies falls.  An MRI of the brain was ordered by his PCP, but it has not been scheduled.  PAST MEDICAL HISTORY: Past Medical History:  Diagnosis Date  . Anemia, iron deficiency 11/15/2011  . Arthritis   . Cancer [name])    hx of prostate; s/p radioactive seed implant 10/2009 Dr Janice Norrie  . Chronic kidney disease   . Glaucoma   . Heart murmur    "mild" per pt  .  History of cardiac cath 2004   negative for CAD  . History of myocardial perfusion scan 02/2010   negative for coronary insufficiency (LVEF 27%)  . HIV infection ([place])   . Hyperlipidemia   . Hypertension    followed by [facility] and Vascular (Dr Dani Gobble Croitoru)  . Nonischemic cardiomyopathy ([place])   . Sinus bradycardia   . Stroke [facility])    "mini stroke" years ago  . Systolic and diastolic CHF, acute ([place]) 06/2010   felt to be secondary to hypertensive cardiomyopathy    PAST SURGICAL HISTORY: Past Surgical History:  Procedure Laterality Date  . AV FISTULA PLACEMENT Right 10/13/2016   Procedure: ARTERIOVENOUS (AV) FISTULA CREATION;  Surgeon: [name], MD;  Location: [place];  Service: Vascular;  Laterality: Right;  . BASCILIC VEIN TRANSPOSITION Left 06/24/2015   Procedure: BASILIC VEIN TRANSPOSITION;  Surgeon: Mal Misty, MD;  Location: [place];  Service: Vascular;  Laterality: Left;  . CARDIAC CATHETERIZATION  02/04/2003   mildly depressed LV systolic fx EF 63%,OVFIEP coronaries/abdominal aorta/renal arteries.  . COLONOSCOPY    . ESOPHAGOGASTRODUODENOSCOPY (EGD) WITH PROPOFOL N/A 05/05/2014   Procedure: ESOPHAGOGASTRODUODENOSCOPY (EGD) WITH PROPOFOL;  Surgeon: Missy Sabins, MD;  [facility];  Service: Endoscopy;  Laterality: N/A;  . EYE SURGERY Bilateral    cataract surgery   . EYE SURGERY Bilateral    glaucoma surgery  . NM MYOCAR PERF WALL MOTION  02/21/2010   normal  .  RADIOACTIVE SEED IMPLANT  2010   prostate cancer  . US ECHOCARDIOGRAPHY  02/06/2012   mild LVH,LA mod. dilated,mild-mod. 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 AI.    MEDICATIONS: Current Outpatient Prescriptions on File Prior to Visit  Medication Sig Dispense Refill  . acetaminophen (TYLENOL) 500 MG tablet Take 500 mg by mouth every 4 (four) hours as needed for moderate pain or headache.     Marland Kitchen amLODipine (NORVASC) 10 MG tablet TAKE 1 TABLET EVERY DAY 90 tablet 3  .  aspirin 325 MG EC tablet Take 325 mg by mouth every evening.     Marland Kitchen [name] 50-200-25 MG TABS tablet Take 1 tablet by mouth daily.    . brimonidine (ALPHAGAN) 0.15 % ophthalmic solution Place 1 drop into the left eye 2 (two) times daily.   1  . carvedilol (COREG) 25 MG tablet TAKE 1 TABLET TWICE DAILY 180 tablet 3  . dolutegravir (TIVICAY) 50 MG tablet Take 1 tablet (50 mg total) by mouth daily. 30 tablet 11  . dorzolamide-timolol (COSOPT) 22.3-6.8 MG/ML ophthalmic solution Place 1 drop into the left eye 2 (two) times daily.     Marland Kitchen emtricitabine-tenofovir AF (DESCOVY) 200-25 MG tablet Take 1 tablet by mouth daily. 30 tablet 11  . furosemide (LASIX) 40 MG tablet Take 2 tablets (80 mg total) by mouth daily. 180 tablet 3  . latanoprost (XALATAN) 0.005 % ophthalmic solution Place 1 drop into the left eye at bedtime. Use 1 drop in left eye at bedtime    . multivitamin (RENA-VIT) TABS tablet Take 1 tablet by mouth daily.  3  . oxyCODONE-acetaminophen (PERCOCET/ROXICET) 5-325 MG tablet Take 1 tablet by mouth every 6 (six) hours as needed. 6 tablet 0  . ranitidine (ZANTAC) 150 MG tablet Take 1 tablet (150 mg total) by mouth 2 (two) times daily. 60 tablet 2  . Vitamin D, Ergocalciferol, (DRISDOL) 50000 UNITS CAPS capsule TK ONE C PO Q  WEEK  2   No current facility-administered medications on file prior to visit.     ALLERGIES: Allergies  Allergen Reactions  . Codeine Other (See Comments)    Causes constipation.   Blanchard Kelch [Losartan Potassium] Other (See Comments)    constipation    FAMILY HISTORY: Family History  Problem Relation Age of Onset  . Hypertension Mother   . Thyroid disease Mother   . Cholelithiasis Daughter   . Cholelithiasis Son   . Hypertension Maternal Grandmother   . Diabetes Maternal Grandmother   . Heart attack Neg Hx   . Hyperlipidemia Neg Hx     SOCIAL HISTORY: Social History   Social History  . Marital status: Married    Spouse name: N/A  . Number of children:  2  . Years of education: 12   Occupational History  . retired, picks up Air traffic controller Retired   Social History Main Topics  . Smoking status: Former Smoker    Quit date: 12/04/2005  . Smokeless tobacco: Never Used  . Alcohol use Yes     Comment: once a week-wine  . Drug use: No  . Sexual activity: Not on file   Other Topics Concern  . Not on file   Social History Narrative   Stays active at home   Regular exercise: no   Drinks 2 cups of coffee a week, 1 mountain dew soda a day.    REVIEW OF SYSTEMS: Constitutional: No fevers, chills, or sweats, no generalized fatigue, change in appetite Eyes: No  visual changes, double vision, eye pain Ear, nose and throat: No hearing loss, ear pain, nasal congestion, sore throat Cardiovascular: No chest pain, palpitations Respiratory:  No shortness of breath at rest or with exertion, wheezes GastrointestinaI: No nausea, vomiting, diarrhea, abdominal pain, fecal incontinence Genitourinary:  No dysuria, urinary retention or frequency Musculoskeletal:  No neck pain, back pain Integumentary: No rash, pruritus, skin lesions Neurological: as above Psychiatric: No depression, insomnia, anxiety Endocrine: No palpitations, fatigue, diaphoresis, mood swings, change in appetite, change in weight, increased thirst Hematologic/Lymphatic:  No purpura, petechiae. Allergic/Immunologic: no itchy/runny eyes, nasal congestion, recent allergic reactions, rashes  PHYSICAL EXAM: Vitals:   10/05/17 0758  BP: 108/72  Pulse: 64  SpO2: (!) 85%   General: No acute distress.  Patient appears well-groomed.  Head:  Normocephalic/atraumatic Eyes:  fundi examined but not visualized Neck: supple, no paraspinal tenderness, full range of motion Back: No paraspinal tenderness Heart: regular rate and rhythm Lungs: Clear to auscultation bilaterally. Vascular: No carotid bruits. Neurological Exam: Mental status: alert and oriented to person, place, and time, recent and  remote memory intact, fund of knowledge intact, attention and concentration intact, speech fluent and not dysarthric, language intact. Cranial nerves: CN I: not tested CN II: pupils equal, round and reactive to light, visual fields intact CN III, IV, VI:  full range of motion, no nystagmus, no ptosis CN V: facial sensation intact CN VII: upper and lower face symmetric CN VIII: hearing intact CN IX, X: gag intact, uvula midline CN XI: sternocleidomastoid and trapezius muscles intact CN XII: tongue midline Bulk & Tone: normal, no fasciculations. Motor:  5/5 throughout  Sensation: temperature and vibration sensation intact. Deep Tendon Reflexes:  3_ throughout, Hoffman sign absent, toes downgoing. Finger to nose testing:  Without dysmetria.  Heel to shin:  Without dysmetria.  Gait:  Upright posture, waddling.  Requires several steps to turn and unable to tandem walk. Romberg negative.  IMPRESSION: 1.  Syncope.  I suspect it may have been a vasovagal response having just lifted a heavy water heater with a significant cardiac history. 2.  Dizziness.  Initial symptoms may have inner ear etiology.  Symptomatology now is vague and could be related to his cardiac disease. 3.  Gait disorder.  History is unclear.  He told me that it is his left hip pain that compromises his walking.  He told my assistant that he feels like he is "being pushed" to either side.   He does exhibit brisk reflexes, so I would like to rule out cervical stenosis.  Given his medical history (TIA, stroke risk factors), I believe MRI of brain is appropriate.  PLAN: 1.  We will get the MRI of brain and cervical spine. 2.  Consider PT to help with gait. 3. Further recommendations pending results.  Thank you for allowing me to take part in the care of this patient.  Metta Clines, DO  CC: [name], NP

## 2017-10-05 NOTE — Telephone Encounter (Signed)
Dr Tomi Likens decided to add an MRI C-Spine in addition to MRI of Head after the Pt had left. I called the Pt. Lm on VM advising him of the added imaging study so he will be aware when [facility] calls to schedule.

## 2017-10-05 NOTE — Patient Instructions (Signed)
1.  I would proceed with MRI of brain.  Further recommendations pending results.

## 2017-11-01 ENCOUNTER — Ambulatory Visit
Admission: RE | Admit: 2017-11-01 | Discharge: 2017-11-01 | Disposition: A | Discharge status: Home / Self Care | Payer: Medicare HMO | Source: Ambulatory Visit | Attending: Neurology | Admitting: Neurology

## 2017-11-01 DIAGNOSIS — R55 Syncope and collapse: Secondary | ICD-10-CM | POA: Diagnosis not present

## 2017-11-01 DIAGNOSIS — S0990XA Unspecified injury of head, initial encounter: Secondary | ICD-10-CM | POA: Diagnosis not present

## 2017-11-01 DIAGNOSIS — R292 Abnormal reflex: Secondary | ICD-10-CM

## 2017-11-01 DIAGNOSIS — M4802 Spinal stenosis, cervical region: Secondary | ICD-10-CM | POA: Diagnosis not present

## 2017-11-03 DIAGNOSIS — J189 Pneumonia, unspecified organism: Secondary | ICD-10-CM

## 2017-11-03 HISTORY — DX: Pneumonia, unspecified organism: J18.9

## 2017-11-05 ENCOUNTER — Telehealth: Payer: Self-pay

## 2017-11-05 NOTE — Telephone Encounter (Signed)
-----   Message from Pieter Partridge, DO sent at 11/02/2017 12:05 PM EST ----- MRI does not reveal any strokes, bleed, tumor or anything else urgent.  MRI of the cervical spine does not reveal any injury to the spinal cord.  To help with balance/walking, recommend physical therapy.

## 2017-11-05 NOTE — Telephone Encounter (Signed)
Called and spoke with Pt, advsd him of MRI results for both brain and C-spine. Pt declined P/T at this time, but will consider it and call back if he decides to do that.

## 2017-11-14 DIAGNOSIS — Z961 Presence of intraocular lens: Secondary | ICD-10-CM | POA: Diagnosis not present

## 2017-11-14 DIAGNOSIS — H401133 Primary open-angle glaucoma, bilateral, severe stage: Secondary | ICD-10-CM | POA: Diagnosis not present

## 2017-11-22 ENCOUNTER — Inpatient Hospital Stay (HOSPITAL_COMMUNITY)
Admission: EM | Admit: 2017-11-22 | Discharge: 2017-12-01 | DRG: 974 | Disposition: A | Discharge status: Home Health | Payer: Medicare HMO | Attending: Internal Medicine | Admitting: Internal Medicine

## 2017-11-22 ENCOUNTER — Emergency Department (HOSPITAL_COMMUNITY): Payer: Medicare HMO

## 2017-11-22 ENCOUNTER — Encounter (HOSPITAL_COMMUNITY): Payer: Self-pay

## 2017-11-22 DIAGNOSIS — N184 Chronic kidney disease, stage 4 (severe): Secondary | ICD-10-CM | POA: Diagnosis present

## 2017-11-22 DIAGNOSIS — J690 Pneumonitis due to inhalation of food and vomit: Secondary | ICD-10-CM | POA: Diagnosis present

## 2017-11-22 DIAGNOSIS — B20 Human immunodeficiency virus [HIV] disease: Secondary | ICD-10-CM | POA: Diagnosis present

## 2017-11-22 DIAGNOSIS — I517 Cardiomegaly: Secondary | ICD-10-CM | POA: Diagnosis not present

## 2017-11-22 DIAGNOSIS — E869 Volume depletion, unspecified: Secondary | ICD-10-CM | POA: Diagnosis present

## 2017-11-22 DIAGNOSIS — Z888 Allergy status to other drugs, medicaments and biological substances status: Secondary | ICD-10-CM

## 2017-11-22 DIAGNOSIS — G9341 Metabolic encephalopathy: Secondary | ICD-10-CM | POA: Diagnosis present

## 2017-11-22 DIAGNOSIS — Z87891 Personal history of nicotine dependence: Secondary | ICD-10-CM

## 2017-11-22 DIAGNOSIS — R0902 Hypoxemia: Secondary | ICD-10-CM | POA: Diagnosis not present

## 2017-11-22 DIAGNOSIS — I5023 Acute on chronic systolic (congestive) heart failure: Secondary | ICD-10-CM | POA: Diagnosis not present

## 2017-11-22 DIAGNOSIS — Z885 Allergy status to narcotic agent status: Secondary | ICD-10-CM

## 2017-11-22 DIAGNOSIS — Z9889 Other specified postprocedural states: Secondary | ICD-10-CM

## 2017-11-22 DIAGNOSIS — E785 Hyperlipidemia, unspecified: Secondary | ICD-10-CM | POA: Diagnosis present

## 2017-11-22 DIAGNOSIS — I5042 Chronic combined systolic (congestive) and diastolic (congestive) heart failure: Secondary | ICD-10-CM | POA: Diagnosis not present

## 2017-11-22 DIAGNOSIS — R0682 Tachypnea, not elsewhere classified: Secondary | ICD-10-CM | POA: Diagnosis not present

## 2017-11-22 DIAGNOSIS — I10 Essential (primary) hypertension: Secondary | ICD-10-CM | POA: Diagnosis not present

## 2017-11-22 DIAGNOSIS — Z452 Encounter for adjustment and management of vascular access device: Secondary | ICD-10-CM

## 2017-11-22 DIAGNOSIS — I351 Nonrheumatic aortic (valve) insufficiency: Secondary | ICD-10-CM | POA: Diagnosis not present

## 2017-11-22 DIAGNOSIS — R739 Hyperglycemia, unspecified: Secondary | ICD-10-CM | POA: Diagnosis present

## 2017-11-22 DIAGNOSIS — Z9911 Dependence on respirator [ventilator] status: Secondary | ICD-10-CM | POA: Diagnosis not present

## 2017-11-22 DIAGNOSIS — I428 Other cardiomyopathies: Secondary | ICD-10-CM | POA: Diagnosis present

## 2017-11-22 DIAGNOSIS — J9621 Acute and chronic respiratory failure with hypoxia: Secondary | ICD-10-CM | POA: Diagnosis not present

## 2017-11-22 DIAGNOSIS — Z4682 Encounter for fitting and adjustment of non-vascular catheter: Secondary | ICD-10-CM | POA: Diagnosis not present

## 2017-11-22 DIAGNOSIS — Z4659 Encounter for fitting and adjustment of other gastrointestinal appliance and device: Secondary | ICD-10-CM

## 2017-11-22 DIAGNOSIS — J9600 Acute respiratory failure, unspecified whether with hypoxia or hypercapnia: Secondary | ICD-10-CM

## 2017-11-22 DIAGNOSIS — Z992 Dependence on renal dialysis: Secondary | ICD-10-CM

## 2017-11-22 DIAGNOSIS — T380X5A Adverse effect of glucocorticoids and synthetic analogues, initial encounter: Secondary | ICD-10-CM | POA: Diagnosis present

## 2017-11-22 DIAGNOSIS — Z8619 Personal history of other infectious and parasitic diseases: Secondary | ICD-10-CM | POA: Diagnosis not present

## 2017-11-22 DIAGNOSIS — J9601 Acute respiratory failure with hypoxia: Secondary | ICD-10-CM | POA: Diagnosis present

## 2017-11-22 DIAGNOSIS — I5043 Acute on chronic combined systolic (congestive) and diastolic (congestive) heart failure: Secondary | ICD-10-CM | POA: Diagnosis present

## 2017-11-22 DIAGNOSIS — H409 Unspecified glaucoma: Secondary | ICD-10-CM | POA: Diagnosis present

## 2017-11-22 DIAGNOSIS — J9690 Respiratory failure, unspecified, unspecified whether with hypoxia or hypercapnia: Secondary | ICD-10-CM | POA: Diagnosis not present

## 2017-11-22 DIAGNOSIS — Z8673 Personal history of transient ischemic attack (TIA), and cerebral infarction without residual deficits: Secondary | ICD-10-CM

## 2017-11-22 DIAGNOSIS — R0602 Shortness of breath: Secondary | ICD-10-CM

## 2017-11-22 DIAGNOSIS — J189 Pneumonia, unspecified organism: Secondary | ICD-10-CM

## 2017-11-22 DIAGNOSIS — I130 Hypertensive heart and chronic kidney disease with heart failure and stage 1 through stage 4 chronic kidney disease, or unspecified chronic kidney disease: Secondary | ICD-10-CM | POA: Diagnosis present

## 2017-11-22 DIAGNOSIS — D539 Nutritional anemia, unspecified: Secondary | ICD-10-CM | POA: Diagnosis present

## 2017-11-22 DIAGNOSIS — R05 Cough: Secondary | ICD-10-CM | POA: Diagnosis not present

## 2017-11-22 DIAGNOSIS — Z8546 Personal history of malignant neoplasm of prostate: Secondary | ICD-10-CM

## 2017-11-22 DIAGNOSIS — A419 Sepsis, unspecified organism: Principal | ICD-10-CM | POA: Diagnosis present

## 2017-11-22 DIAGNOSIS — J9622 Acute and chronic respiratory failure with hypercapnia: Secondary | ICD-10-CM | POA: Diagnosis not present

## 2017-11-22 DIAGNOSIS — N179 Acute kidney failure, unspecified: Secondary | ICD-10-CM | POA: Diagnosis present

## 2017-11-22 DIAGNOSIS — D509 Iron deficiency anemia, unspecified: Secondary | ICD-10-CM | POA: Diagnosis present

## 2017-11-22 DIAGNOSIS — I340 Nonrheumatic mitral (valve) insufficiency: Secondary | ICD-10-CM | POA: Diagnosis not present

## 2017-11-22 DIAGNOSIS — R350 Frequency of micturition: Secondary | ICD-10-CM | POA: Diagnosis not present

## 2017-11-22 DIAGNOSIS — R0603 Acute respiratory distress: Secondary | ICD-10-CM

## 2017-11-22 DIAGNOSIS — N186 End stage renal disease: Secondary | ICD-10-CM | POA: Diagnosis present

## 2017-11-22 DIAGNOSIS — R918 Other nonspecific abnormal finding of lung field: Secondary | ICD-10-CM | POA: Diagnosis not present

## 2017-11-22 DIAGNOSIS — Z978 Presence of other specified devices: Secondary | ICD-10-CM

## 2017-11-22 LAB — COMPREHENSIVE METABOLIC PANEL
ALBUMIN: 3.7 g/dL (ref 3.5–5.0)
ALT: 20 U/L (ref 17–63)
ANION GAP: 12 (ref 5–15)
AST: 22 U/L (ref 15–41)
Alkaline Phosphatase: 104 U/L (ref 38–126)
BILIRUBIN TOTAL: 0.6 mg/dL (ref 0.3–1.2)
BUN: 66 mg/dL — ABNORMAL HIGH (ref 6–20)
CHLORIDE: 106 mmol/L (ref 101–111)
CO2: 19 mmol/L — AB (ref 22–32)
Calcium: 8.5 mg/dL — ABNORMAL LOW (ref 8.9–10.3)
Creatinine, Ser: 4.82 mg/dL — ABNORMAL HIGH (ref 0.61–1.24)
GFR calc Af Amer: 12 mL/min — ABNORMAL LOW (ref >60)
GFR calc non Af Amer: 11 mL/min — ABNORMAL LOW (ref >60)
GLUCOSE: 219 mg/dL — AB (ref 65–99)
POTASSIUM: 4.4 mmol/L (ref 3.5–5.1)
SODIUM: 137 mmol/L (ref 135–145)
TOTAL PROTEIN: 7.5 g/dL (ref 6.5–8.1)

## 2017-11-22 LAB — I-STAT CHEM 8, ED
BUN: 58 mg/dL — ABNORMAL HIGH (ref 6–20)
CALCIUM ION: 1.01 mmol/L — AB (ref 1.15–1.40)
Chloride: 110 mmol/L (ref 101–111)
Creatinine, Ser: 4.9 mg/dL — ABNORMAL HIGH (ref 0.61–1.24)
GLUCOSE: 225 mg/dL — AB (ref 65–99)
HCT: 43 % (ref 39.0–52.0)
HEMOGLOBIN: 14.6 g/dL (ref 13.0–17.0)
POTASSIUM: 4.3 mmol/L (ref 3.5–5.1)
SODIUM: 142 mmol/L (ref 135–145)
TCO2: 21 mmol/L — ABNORMAL LOW (ref 22–32)

## 2017-11-22 LAB — CBC WITH DIFFERENTIAL/PLATELET
BASOS ABS: 0 10*3/uL (ref 0.0–0.1)
BASOS PCT: 0 %
EOS ABS: 0.4 10*3/uL (ref 0.0–0.7)
EOS PCT: 3 %
HCT: 39.6 % (ref 39.0–52.0)
Hemoglobin: 12.5 g/dL — ABNORMAL LOW (ref 13.0–17.0)
Lymphocytes Relative: 7 %
Lymphs Abs: 1.2 10*3/uL (ref 0.7–4.0)
MCH: 32.5 pg (ref 26.0–34.0)
MCHC: 31.6 g/dL (ref 30.0–36.0)
MCV: 102.9 fL — ABNORMAL HIGH (ref 78.0–100.0)
MONO ABS: 0.9 10*3/uL (ref 0.1–1.0)
MONOS PCT: 5 %
Neutro Abs: 14.5 10*3/uL — ABNORMAL HIGH (ref 1.7–7.7)
Neutrophils Relative %: 85 %
PLATELETS: 206 10*3/uL (ref 150–400)
RBC: 3.85 MIL/uL — ABNORMAL LOW (ref 4.22–5.81)
RDW: 14 % (ref 11.5–15.5)
WBC: 17.1 10*3/uL — ABNORMAL HIGH (ref 4.0–10.5)

## 2017-11-22 LAB — I-STAT ARTERIAL BLOOD GAS, ED
Acid-base deficit: 6 mmol/L — ABNORMAL HIGH (ref 0.0–2.0)
Bicarbonate: 20.5 mmol/L (ref 20.0–28.0)
O2 SAT: 96 %
PH ART: 7.3 — AB (ref 7.350–7.450)
TCO2: 22 mmol/L (ref 22–32)
pCO2 arterial: 41.6 mmHg (ref 32.0–48.0)
pO2, Arterial: 87 mmHg (ref 83.0–108.0)

## 2017-11-22 LAB — BRAIN NATRIURETIC PEPTIDE: B NATRIURETIC PEPTIDE 5: 667.5 pg/mL — AB (ref 0.0–100.0)

## 2017-11-22 LAB — I-STAT CG4 LACTIC ACID, ED: LACTIC ACID, VENOUS: 1.05 mmol/L (ref 0.5–1.9)

## 2017-11-22 LAB — I-STAT TROPONIN, ED: Troponin i, poc: 0.02 ng/mL (ref 0.00–0.08)

## 2017-11-22 LAB — CBG MONITORING, ED: GLUCOSE-CAPILLARY: 198 mg/dL — AB (ref 65–99)

## 2017-11-22 MED ORDER — AZITHROMYCIN 500 MG IV SOLR
500.0000 mg | Freq: Once | INTRAVENOUS | Status: AC
  Administered 2017-11-23: 500 mg via INTRAVENOUS
  Filled 2017-11-22: qty 500

## 2017-11-22 MED ORDER — FUROSEMIDE 10 MG/ML IJ SOLN
60.0000 mg | Freq: Once | INTRAMUSCULAR | Status: AC
  Administered 2017-11-22: 60 mg via INTRAVENOUS
  Filled 2017-11-22: qty 6

## 2017-11-22 MED ORDER — DEXTROSE 5 % IV SOLN
1.0000 g | Freq: Once | INTRAVENOUS | Status: AC
  Administered 2017-11-22: 1 g via INTRAVENOUS
  Filled 2017-11-22: qty 10

## 2017-11-22 NOTE — ED Triage Notes (Signed)
Pt BIB [facility] for respiratory distress. Pt was at friends house when he started to feel bad, started to drive home and became increasingly SOB, EMS found pt hunched over in car with pink tinged vomit on ground. Initial O2 saturation was in the 70s, pt placed on CPAP, given 10mg  albuterol, 1 atrovent and 1 Nitro en route. Rales/rhonchi noted in all lung fields. Pt alert, BiPAP applied upon arrival EDP at bedside.

## 2017-11-22 NOTE — ED Provider Notes (Signed)
[name] EMERGENCY DEPARTMENT Provider Note   CSN: [id number] Arrival date & time: 11/22/17  2216     History   Chief Complaint Chief Complaint  Patient presents with  . Respiratory Distress    HPI [name] is a 76 y.o. male.  HPI [facility] [name] is a 76 y.o. male with history of anemia, prostate cancer, HIV, CVA, hypertension, CHF, presents to emergency department with acute onset of shortness of breath.  Patient states he was feeling well up until this afternoon.  He states he suddenly began having difficulty breathing.  EMS was called.  Patient with rales and rhonchi in both lungs, was given a total of 10 mg of albuterol, 1 of Atrovent, 1 nitroglycerin sublingually, started on CPAP.  Patient states he is feeling slightly better.  He denies any chest pain.  He has not had any fever or chills.  No recent travel or surgeries.  He does admit to mild cough but denies any severe upper respiratory symptoms.  Past Medical History:  Diagnosis Date  . Anemia, iron deficiency 11/15/2011  . Arthritis   . Cancer [facility])    hx of prostate; s/p radioactive seed implant 10/2009 Dr Janice Norrie  . Chronic kidney disease   . Glaucoma   . Heart murmur    "mild" per pt  . History of cardiac cath 2004   negative for CAD  . History of myocardial perfusion scan 02/2010   negative for coronary insufficiency (LVEF 27%)  . HIV infection ([place])   . Hyperlipidemia   . Hypertension    followed by [facility] (Dr Dani Gobble Croitoru)  . Nonischemic cardiomyopathy ([place])   . Sinus bradycardia   . Stroke [facility])    "mini stroke" years ago  . Systolic and diastolic CHF, acute ([place]) 06/2010   felt to be secondary to hypertensive cardiomyopathy    Patient Active Problem List   Diagnosis Date Noted  . 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 AIDS ([place]) 05/14/2014  . 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 ARF (acute renal failure) ([place]) 05/06/2014  . Gastroparesis 05/06/2014  .  Protein-calorie malnutrition, severe ([place]) 05/05/2014  . Severe malnutrition ([place]) 05/04/2014  . CKD (chronic kidney disease) stage 4, GFR 15-29 ml/min ([facility]) 05/02/2014  . Hypotension 05/02/2014  . Loss of weight 04/29/2014  . Conjunctivitis 04/29/2014  . 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 Renal insufficiency 04/18/2013  . Low back pain 04/09/2012  . Osteoarthritis of hip 12/29/2011  . Iron deficiency anemia 06/28/2011  . Normocytic anemia 04/02/2011  . 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 Hyperlipidemia 11/30/2010  . Essential hypertension 11/30/2010  . Chronic combined systolic and diastolic heart failure ([name]) 11/30/2010  . Transient cerebral ischemia 11/30/2010  . HYPERGLYCEMIA 11/30/2010    Past Surgical History:  Procedure Laterality Date  . AV FISTULA PLACEMENT Right 10/13/2016   Procedure: ARTERIOVENOUS (AV) FISTULA CREATION;  Surgeon: [name], MD;  Location: [place];  Service: Vascular;  Laterality: Right;  . BASCILIC VEIN TRANSPOSITION Left 06/24/2015   Procedure: BASILIC VEIN TRANSPOSITION;  Surgeon: Mal Misty, MD;  Location: [place];  Service: Vascular;  Laterality: Left;  . CARDIAC CATHETERIZATION  02/04/2003   mildly depressed LV systolic fx EF 47%,[id number] coronaries/abdominal aorta/renal arteries.  . COLONOSCOPY    . ESOPHAGOGASTRODUODENOSCOPY (EGD) WITH PROPOFOL N/A 05/05/2014   Procedure: ESOPHAGOGASTRODUODENOSCOPY (EGD) WITH PROPOFOL;  Surgeon: Missy Sabins, MD;  Location: [facility];  Service: Endoscopy;  Laterality: N/A;  .  EYE SURGERY Bilateral    cataract surgery   . EYE SURGERY Bilateral    glaucoma surgery  . NM MYOCAR PERF WALL MOTION  02/21/2010   normal  . RADIOACTIVE SEED IMPLANT  2010   prostate cancer  . US ECHOCARDIOGRAPHY  02/06/2012   mild LVH,David mod. dilated,mild-mod. 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 AI.       Home Medications    Prior to Admission medications     Medication Sig Start Date End Date Taking? Authorizing Provider  acetaminophen (TYLENOL) 500 MG tablet Take 500 mg by mouth every 4 (four) hours as needed for moderate pain or headache.     [provider]  amLODipine (NORVASC) 10 MG tablet TAKE 1 TABLET EVERY DAY 01/09/17   Croitoru, Mihai, MD  aspirin 325 MG EC tablet Take 325 mg by mouth every evening.     [provider]  [name] 50-200-25 MG TABS tablet Take 1 tablet by mouth daily. 08/17/17   [provider]  brimonidine (ALPHAGAN) 0.15 % ophthalmic solution Place 1 drop into the left eye 2 (two) times daily.  11/18/15   [provider]  carvedilol (COREG) 25 MG tablet TAKE 1 TABLET TWICE DAILY 01/09/17   Croitoru, Mihai, MD  dolutegravir (TIVICAY) 50 MG tablet Take 1 tablet (50 mg total) by mouth daily. 08/22/17   Truman Hayward, MD  dorzolamide-timolol (COSOPT) 22.3-6.8 MG/ML ophthalmic solution Place 1 drop into the left eye 2 (two) times daily.     [provider]  emtricitabine-tenofovir AF (DESCOVY) 200-25 MG tablet Take 1 tablet by mouth daily. 08/22/17   Truman Hayward, MD  furosemide (LASIX) 40 MG tablet Take 2 tablets (80 mg total) by mouth daily. 09/14/17 09/09/18  Croitoru, Mihai, MD  latanoprost (XALATAN) 0.005 % ophthalmic solution Place 1 drop into the left eye at bedtime. Use 1 drop in left eye at bedtime    [provider]  multivitamin (RENA-VIT) TABS tablet Take 1 tablet by mouth daily. 07/20/16   [provider]  oxyCODONE-acetaminophen (PERCOCET/ROXICET) 5-325 MG tablet Take 1 tablet by mouth every 6 (six) hours as needed. 10/13/16   Ulyses Amor, PA-C  ranitidine (ZANTAC) 150 MG tablet Take 1 tablet (150 mg total) by mouth 2 (two) times daily. 08/02/17   [name], NP  Vitamin D, Ergocalciferol, (DRISDOL) 50000 UNITS CAPS capsule TK ONE C PO Q  WEEK 04/10/15   [provider]    Family History Family History  Problem Relation Age of  Onset  . Hypertension Mother   . Thyroid disease Mother   . Cholelithiasis Daughter   . Cholelithiasis Son   . Hypertension Maternal Grandmother   . Diabetes Maternal Grandmother   . Heart attack Neg Hx   . Hyperlipidemia Neg Hx     Social History Social History   Tobacco Use  . Smoking status: Former Smoker    Last attempt to quit: 12/04/2005    Years since quitting: 11.9  . Smokeless tobacco: Never Used  Substance Use Topics  . Alcohol use: Yes    Comment: once a week-wine  . Drug use: No     Allergies   Codeine and Cozaar [losartan potassium]   Review of Systems Review of Systems  Constitutional: Negative for chills and fever.  Respiratory: Positive for cough and shortness of breath. Negative for chest tightness.   Cardiovascular: Negative for chest pain, palpitations and leg swelling.  Gastrointestinal: Negative for  abdominal distention, abdominal pain, diarrhea, nausea and vomiting.  Genitourinary: Negative for dysuria, frequency, hematuria and urgency.  Musculoskeletal: Negative for arthralgias, myalgias, neck pain and neck stiffness.  Skin: Negative for rash.  Allergic/Immunologic: Negative for immunocompromised state.  Neurological: Negative for dizziness, weakness, light-headedness, numbness and headaches.  All other systems reviewed and are negative.    Physical Exam Updated Vital Signs BP (!) 148/90 (BP Location: Right Arm)   Pulse 96   Temp 98 F (36.7 C) (Axillary)   Resp (!) 39   SpO2 96%   Physical Exam  Constitutional: He appears well-developed and well-nourished. No distress.  HENT:  Head: Normocephalic and atraumatic.  Eyes: Conjunctivae are normal.  Neck: Neck supple.  Cardiovascular: Normal rate, regular rhythm and normal heart sounds.  Pulmonary/Chest: He is in respiratory distress. He has no wheezes. He has rales.  On bipap  Abdominal: Soft. Bowel sounds are normal. He exhibits no distension. There is no tenderness. There is no  rebound.  Musculoskeletal: He exhibits edema.  1+ peripheral edema  Neurological: He is alert.  Skin: Skin is warm and dry.  Nursing note and vitals reviewed.    ED Treatments / Results  Labs (all labs ordered are listed, but only abnormal results are displayed) Labs Reviewed  CBC WITH DIFFERENTIAL/PLATELET - Abnormal; Notable for the following components:      Result Value   WBC 17.1 (*)    RBC 3.85 (*)    Hemoglobin 12.5 (*)    MCV 102.9 (*)    Neutro Abs 14.5 (*)    All other components within normal limits  COMPREHENSIVE METABOLIC PANEL - Abnormal; Notable for the following components:   CO2 19 (*)    Glucose, Bld 219 (*)    BUN 66 (*)    Creatinine, Ser 4.82 (*)    Calcium 8.5 (*)    GFR calc non Af Amer 11 (*)    GFR calc Af Amer 12 (*)    All other components within normal limits  BRAIN NATRIURETIC PEPTIDE - Abnormal; Notable for the following components:   B Natriuretic Peptide 667.5 (*)    All other components within normal limits  I-STAT ARTERIAL BLOOD GAS, ED - Abnormal; Notable for the following components:   pH, Arterial 7.300 (*)    Acid-base deficit 6.0 (*)    All other components within normal limits  I-STAT CHEM 8, ED - Abnormal; Notable for the following components:   BUN 58 (*)    Creatinine, Ser 4.90 (*)    Glucose, Bld 225 (*)    Calcium, Ion 1.01 (*)    TCO2 21 (*)    All other components within normal limits  CBG MONITORING, ED - Abnormal; Notable for the following components:   Glucose-Capillary 198 (*)    All other components within normal limits  CULTURE, BLOOD (ROUTINE X 2)  CULTURE, BLOOD (ROUTINE X 2)  CULTURE, EXPECTORATED SPUTUM-ASSESSMENT  GRAM STAIN  T-HELPER CELLS (CD4) COUNT (NOT AT [facility])  HIV 1 RNA QUANT-NO REFLEX-BLD  HIV ANTIBODY (ROUTINE TESTING)  STREP PNEUMONIAE URINARY ANTIGEN  LEGIONELLA PNEUMOPHILA SEROGP 1 UR AG  BASIC METABOLIC PANEL  CBC WITH DIFFERENTIAL/PLATELET  SODIUM, URINE, RANDOM  UREA NITROGEN, URINE    CREATININE, URINE, RANDOM  I-STAT TROPONIN, ED  I-STAT CG4 LACTIC ACID, ED    EKG  EKG Interpretation  Date/Time:  Thursday November 22 2017 22:16:48 EST Ventricular Rate:  100 PR Interval:    QRS Duration: 131 QT Interval:  402 QTC  Calculation: 519 R Axis:   -65 Text Interpretation:  Sinus tachycardia Ventricular premature complex Probable left atrial enlargement LVH with IVCD, LAD and secondary repol abnrm Inferior infarct, old Anterior ST elevation, probably due to LVH Prolonged QT interval When compared with ECG of 05/04/2014, HEART RATE has increased Non-specific intra-ventricular conduction delay is now present QT has lengthened Premature ventricular complexes are now present Repolarization changes from left ventricular hypertrophy  are now present Confirmed by Delora Fuel ([id number]) on 11/22/2017 11:28:11 PM       Radiology Dg Chest Portable 1 View  Result Date: 11/22/2017 CLINICAL DATA:  Respiratory distress with shortness of breath EXAM: PORTABLE CHEST 1 VIEW COMPARISON:  Chest radiograph January 25, 2010 and chest CT Apr 29, 2014 FINDINGS: There is airspace consolidation throughout the right mid lower lung zones. There is also consolidation in the medial left base. There is cardiomegaly with pulmonary vascularity within normal limits. There is a small right pleural effusion. No evident adenopathy. No bone lesions. IMPRESSION: Extensive consolidation throughout much of the right mid and lower lung zones felt to represent multifocal pneumonia. Consolidation medial left base also felt to represent pneumonia. Small right pleural effusion. Cardiomegaly with pulmonary vascularity within normal limits. No adenopathy evident. Electronically Signed   By: Lowella Grip III M.D.   On: 11/22/2017 22:37    Procedures Procedures (including critical care time)  Medications Ordered in ED Medications  cefTRIAXone (ROCEPHIN) 1 g in dextrose 5 % 50 mL IVPB (not administered)  azithromycin  (ZITHROMAX) 500 mg in dextrose 5 % 250 mL IVPB (not administered)  furosemide (LASIX) injection 60 mg (60 mg Intravenous Given 11/22/17 2225)     Initial Impression / Assessment and Plan / ED Course  I have reviewed the triage vital signs and the nursing notes.  Pertinent labs & imaging results that were available during my care of the patient were reviewed by me and considered in my medical decision making (see chart for details).    Patient seen and examined.  Patient with respiratory distress that he reported this afternoon, according to EMS Rales and rhonchi in both lung fields.  He received 10 mg of albuterol, 1 mg of Atrovent, 1 sublingual nitroglycerin, on CPAP.  Initial oxygen is in the 80s.  Accessory muscle use present.  Patient denies severe cough, no URI symptoms.  Patient does have HIV, last CD4 count is 310 2 months ago.  Viral load undetected at that time.  10:50 PM Patient is x-ray with large right lung pneumonia with some opacities in the left lower lobe as well.  Patient is on BiPAP, feeling better.  We will start antibiotics.  Will hold off on loading patient with IV fluids given history of CHF.  Lactic acid is normal at this time.  The rest of the blood work is pending.  Discussed results with patient.  Discussed with hospitalist.  Will admit.  Patient continues on BiPAP, he is improving and looks much better. vital signs are normal other than tachypnea, pt is on bipap. Does not meet sepsis criteria. Normal lactic acid. Normal HR and BP. Antibiotics are running.   Vitals:   11/23/17 0045 11/23/17 0100  BP: (!) 144/74 (!) 137/97  Pulse: 78 81  Resp: (!) 25 (!) 24  Temp:    SpO2: 100% 97%     Final Clinical Impressions(s) / ED Diagnoses   Final diagnoses:  Community acquired pneumonia of right lung, unspecified part of lung    ED Discharge Orders  None       Jeannett Senior, PA-C 82/80/03 [id number]    Delora Fuel, MD 91/50/56 [phone]

## 2017-11-22 NOTE — ED Notes (Signed)
Portable at bedside 

## 2017-11-23 ENCOUNTER — Inpatient Hospital Stay (HOSPITAL_COMMUNITY): Payer: Medicare HMO

## 2017-11-23 DIAGNOSIS — E785 Hyperlipidemia, unspecified: Secondary | ICD-10-CM | POA: Diagnosis present

## 2017-11-23 DIAGNOSIS — R739 Hyperglycemia, unspecified: Secondary | ICD-10-CM | POA: Diagnosis present

## 2017-11-23 DIAGNOSIS — D509 Iron deficiency anemia, unspecified: Secondary | ICD-10-CM | POA: Diagnosis present

## 2017-11-23 DIAGNOSIS — R0602 Shortness of breath: Secondary | ICD-10-CM | POA: Diagnosis not present

## 2017-11-23 DIAGNOSIS — J690 Pneumonitis due to inhalation of food and vomit: Secondary | ICD-10-CM

## 2017-11-23 DIAGNOSIS — G9341 Metabolic encephalopathy: Secondary | ICD-10-CM | POA: Diagnosis present

## 2017-11-23 DIAGNOSIS — A419 Sepsis, unspecified organism: Secondary | ICD-10-CM | POA: Diagnosis present

## 2017-11-23 DIAGNOSIS — I5042 Chronic combined systolic (congestive) and diastolic (congestive) heart failure: Secondary | ICD-10-CM | POA: Diagnosis not present

## 2017-11-23 DIAGNOSIS — J9601 Acute respiratory failure with hypoxia: Secondary | ICD-10-CM | POA: Diagnosis present

## 2017-11-23 DIAGNOSIS — T380X5A Adverse effect of glucocorticoids and synthetic analogues, initial encounter: Secondary | ICD-10-CM | POA: Diagnosis present

## 2017-11-23 DIAGNOSIS — Z885 Allergy status to narcotic agent status: Secondary | ICD-10-CM

## 2017-11-23 DIAGNOSIS — I340 Nonrheumatic mitral (valve) insufficiency: Secondary | ICD-10-CM | POA: Diagnosis not present

## 2017-11-23 DIAGNOSIS — I428 Other cardiomyopathies: Secondary | ICD-10-CM | POA: Diagnosis present

## 2017-11-23 DIAGNOSIS — I10 Essential (primary) hypertension: Secondary | ICD-10-CM | POA: Diagnosis not present

## 2017-11-23 DIAGNOSIS — N184 Chronic kidney disease, stage 4 (severe): Secondary | ICD-10-CM | POA: Diagnosis present

## 2017-11-23 DIAGNOSIS — Z87891 Personal history of nicotine dependence: Secondary | ICD-10-CM

## 2017-11-23 DIAGNOSIS — Z8619 Personal history of other infectious and parasitic diseases: Secondary | ICD-10-CM | POA: Diagnosis not present

## 2017-11-23 DIAGNOSIS — N179 Acute kidney failure, unspecified: Secondary | ICD-10-CM | POA: Diagnosis present

## 2017-11-23 DIAGNOSIS — J189 Pneumonia, unspecified organism: Secondary | ICD-10-CM | POA: Diagnosis not present

## 2017-11-23 DIAGNOSIS — Z888 Allergy status to other drugs, medicaments and biological substances status: Secondary | ICD-10-CM

## 2017-11-23 DIAGNOSIS — I5043 Acute on chronic combined systolic (congestive) and diastolic (congestive) heart failure: Secondary | ICD-10-CM | POA: Diagnosis present

## 2017-11-23 DIAGNOSIS — I130 Hypertensive heart and chronic kidney disease with heart failure and stage 1 through stage 4 chronic kidney disease, or unspecified chronic kidney disease: Secondary | ICD-10-CM

## 2017-11-23 DIAGNOSIS — B20 Human immunodeficiency virus [HIV] disease: Secondary | ICD-10-CM

## 2017-11-23 DIAGNOSIS — D539 Nutritional anemia, unspecified: Secondary | ICD-10-CM | POA: Diagnosis present

## 2017-11-23 DIAGNOSIS — R350 Frequency of micturition: Secondary | ICD-10-CM | POA: Diagnosis not present

## 2017-11-23 DIAGNOSIS — I351 Nonrheumatic aortic (valve) insufficiency: Secondary | ICD-10-CM | POA: Diagnosis not present

## 2017-11-23 DIAGNOSIS — H409 Unspecified glaucoma: Secondary | ICD-10-CM | POA: Diagnosis present

## 2017-11-23 DIAGNOSIS — J9621 Acute and chronic respiratory failure with hypoxia: Secondary | ICD-10-CM | POA: Diagnosis not present

## 2017-11-23 DIAGNOSIS — J9690 Respiratory failure, unspecified, unspecified whether with hypoxia or hypercapnia: Secondary | ICD-10-CM | POA: Diagnosis not present

## 2017-11-23 DIAGNOSIS — E869 Volume depletion, unspecified: Secondary | ICD-10-CM | POA: Diagnosis present

## 2017-11-23 DIAGNOSIS — Z8673 Personal history of transient ischemic attack (TIA), and cerebral infarction without residual deficits: Secondary | ICD-10-CM | POA: Diagnosis not present

## 2017-11-23 DIAGNOSIS — Z8546 Personal history of malignant neoplasm of prostate: Secondary | ICD-10-CM | POA: Diagnosis not present

## 2017-11-23 DIAGNOSIS — J9622 Acute and chronic respiratory failure with hypercapnia: Secondary | ICD-10-CM | POA: Diagnosis not present

## 2017-11-23 DIAGNOSIS — I5023 Acute on chronic systolic (congestive) heart failure: Secondary | ICD-10-CM | POA: Diagnosis not present

## 2017-11-23 DIAGNOSIS — Z9911 Dependence on respirator [ventilator] status: Secondary | ICD-10-CM | POA: Diagnosis not present

## 2017-11-23 LAB — CBC WITH DIFFERENTIAL/PLATELET
BASOS ABS: 0 10*3/uL (ref 0.0–0.1)
Basophils Relative: 0 %
EOS ABS: 0.1 10*3/uL (ref 0.0–0.7)
EOS PCT: 1 %
HCT: 36.1 % — ABNORMAL LOW (ref 39.0–52.0)
Hemoglobin: 11.7 g/dL — ABNORMAL LOW (ref 13.0–17.0)
Lymphocytes Relative: 12 %
Lymphs Abs: 1.4 10*3/uL (ref 0.7–4.0)
MCH: 32.7 pg (ref 26.0–34.0)
MCHC: 32.4 g/dL (ref 30.0–36.0)
MCV: 100.8 fL — AB (ref 78.0–100.0)
MONO ABS: 0.8 10*3/uL (ref 0.1–1.0)
Monocytes Relative: 7 %
Neutro Abs: 9.4 10*3/uL — ABNORMAL HIGH (ref 1.7–7.7)
Neutrophils Relative %: 80 %
PLATELETS: 238 10*3/uL (ref 150–400)
RBC: 3.58 MIL/uL — AB (ref 4.22–5.81)
RDW: 13.7 % (ref 11.5–15.5)
WBC: 11.7 10*3/uL — AB (ref 4.0–10.5)

## 2017-11-23 LAB — GLUCOSE, CAPILLARY: GLUCOSE-CAPILLARY: 221 mg/dL — AB (ref 65–99)

## 2017-11-23 LAB — BASIC METABOLIC PANEL
Anion gap: 9 (ref 5–15)
BUN: 63 mg/dL — AB (ref 6–20)
CALCIUM: 8.4 mg/dL — AB (ref 8.9–10.3)
CO2: 22 mmol/L (ref 22–32)
CREATININE: 4.71 mg/dL — AB (ref 0.61–1.24)
Chloride: 108 mmol/L (ref 101–111)
GFR calc Af Amer: 13 mL/min — ABNORMAL LOW (ref >60)
GFR, EST NON AFRICAN AMERICAN: 11 mL/min — AB (ref >60)
GLUCOSE: 122 mg/dL — AB (ref 65–99)
Potassium: 4.1 mmol/L (ref 3.5–5.1)
SODIUM: 139 mmol/L (ref 135–145)

## 2017-11-23 LAB — T-HELPER CELLS (CD4) COUNT (NOT AT ARMC)
CD4 % Helper T Cell: 17 % — ABNORMAL LOW (ref 33–55)
CD4 T CELL ABS: 170 /uL — AB (ref 400–2700)

## 2017-11-23 LAB — SODIUM, URINE, RANDOM: Sodium, Ur: 106 mmol/L

## 2017-11-23 LAB — INFLUENZA PANEL BY PCR (TYPE A & B)
INFLBPCR: NEGATIVE
Influenza A By PCR: NEGATIVE

## 2017-11-23 LAB — MRSA PCR SCREENING: MRSA by PCR: NEGATIVE

## 2017-11-23 LAB — CREATININE, URINE, RANDOM: Creatinine, Urine: 32.88 mg/dL

## 2017-11-23 LAB — STREP PNEUMONIAE URINARY ANTIGEN: STREP PNEUMO URINARY ANTIGEN: NEGATIVE

## 2017-11-23 MED ORDER — DEXTROSE 5 % IV SOLN
1.0000 g | INTRAVENOUS | Status: DC
  Filled 2017-11-23: qty 10

## 2017-11-23 MED ORDER — OXYCODONE-ACETAMINOPHEN 5-325 MG PO TABS: 1.0000 | ORAL_TABLET | Freq: Four times a day (QID) | ORAL | Status: DC | PRN

## 2017-11-23 MED ORDER — SODIUM CHLORIDE 0.9% FLUSH
3.0000 mL | Freq: Two times a day (BID) | INTRAVENOUS | Status: DC
  Administered 2017-11-23 – 2017-12-01 (×16): 3 mL via INTRAVENOUS

## 2017-11-23 MED ORDER — SULFAMETHOXAZOLE-TRIMETHOPRIM 800-160 MG PO TABS
1.0000 | ORAL_TABLET | Freq: Two times a day (BID) | ORAL | Status: DC
  Administered 2017-11-23 (×2): 1 via ORAL
  Filled 2017-11-23 (×2): qty 1

## 2017-11-23 MED ORDER — FAMOTIDINE 20 MG PO TABS
20.0000 mg | ORAL_TABLET | Freq: Two times a day (BID) | ORAL | Status: DC
  Administered 2017-11-23: 20 mg via ORAL
  Filled 2017-11-23: qty 1

## 2017-11-23 MED ORDER — CARVEDILOL 25 MG PO TABS
25.0000 mg | ORAL_TABLET | Freq: Two times a day (BID) | ORAL | Status: DC
  Administered 2017-11-23 – 2017-11-24 (×4): 25 mg via ORAL
  Filled 2017-11-23 (×5): qty 1

## 2017-11-23 MED ORDER — SODIUM CHLORIDE 0.9% FLUSH: 3.0000 mL | INTRAVENOUS | Status: DC | PRN

## 2017-11-23 MED ORDER — HYDRALAZINE HCL 20 MG/ML IJ SOLN
10.0000 mg | INTRAMUSCULAR | Status: DC | PRN
  Administered 2017-11-24: 10 mg via INTRAVENOUS
  Filled 2017-11-23: qty 1

## 2017-11-23 MED ORDER — METHYLPREDNISOLONE SODIUM SUCC 40 MG IJ SOLR
40.0000 mg | Freq: Two times a day (BID) | INTRAMUSCULAR | Status: DC
  Administered 2017-11-23 (×2): 40 mg via INTRAVENOUS
  Filled 2017-11-23 (×2): qty 1

## 2017-11-23 MED ORDER — HEPARIN SODIUM (PORCINE) 5000 UNIT/ML IJ SOLN
5000.0000 [IU] | Freq: Three times a day (TID) | INTRAMUSCULAR | Status: DC
  Administered 2017-11-23 – 2017-12-01 (×24): 5000 [IU] via SUBCUTANEOUS
  Filled 2017-11-23 (×24): qty 1

## 2017-11-23 MED ORDER — BICTEGRAVIR-EMTRICITAB-TENOFOV 50-200-25 MG PO TABS: 1.0000 | ORAL_TABLET | Freq: Every day | ORAL | Status: DC

## 2017-11-23 MED ORDER — DOLUTEGRAVIR SODIUM 50 MG PO TABS
50.0000 mg | ORAL_TABLET | Freq: Every day | ORAL | Status: DC
  Administered 2017-11-23 – 2017-12-01 (×9): 50 mg via ORAL
  Filled 2017-11-23 (×9): qty 1

## 2017-11-23 MED ORDER — AMLODIPINE BESYLATE 10 MG PO TABS
10.0000 mg | ORAL_TABLET | Freq: Every day | ORAL | Status: DC
  Administered 2017-11-23 – 2017-11-24 (×2): 10 mg via ORAL
  Filled 2017-11-23 (×2): qty 1

## 2017-11-23 MED ORDER — DEXTROSE 5 % IV SOLN
500.0000 mg | INTRAVENOUS | Status: DC
  Administered 2017-11-23: 500 mg via INTRAVENOUS
  Filled 2017-11-23: qty 500

## 2017-11-23 MED ORDER — BRIMONIDINE TARTRATE 0.15 % OP SOLN
1.0000 [drp] | Freq: Two times a day (BID) | OPHTHALMIC | Status: DC
  Administered 2017-11-23 – 2017-12-01 (×17): 1 [drp] via OPHTHALMIC
  Filled 2017-11-23: qty 5

## 2017-11-23 MED ORDER — ASPIRIN EC 325 MG PO TBEC
325.0000 mg | DELAYED_RELEASE_TABLET | Freq: Every evening | ORAL | Status: DC
  Administered 2017-11-23 – 2017-11-24 (×2): 325 mg via ORAL
  Filled 2017-11-23 (×2): qty 1

## 2017-11-23 MED ORDER — FAMOTIDINE 20 MG PO TABS: 20.0000 mg | ORAL_TABLET | Freq: Every day | ORAL | Status: DC

## 2017-11-23 MED ORDER — SODIUM CHLORIDE 0.9 % IV SOLN: 250.0000 mL | INTRAVENOUS | Status: DC | PRN

## 2017-11-23 MED ORDER — VANCOMYCIN HCL IN DEXTROSE 1-5 GM/200ML-% IV SOLN
1000.0000 mg | INTRAVENOUS | Status: DC
Start: 2017-11-24 — End: 2017-11-23

## 2017-11-23 MED ORDER — VANCOMYCIN HCL 10 G IV SOLR
1500.0000 mg | Freq: Once | INTRAVENOUS | Status: AC
  Administered 2017-11-23: 1500 mg via INTRAVENOUS
  Filled 2017-11-23: qty 1500

## 2017-11-23 MED ORDER — RENA-VITE PO TABS
1.0000 | ORAL_TABLET | Freq: Every day | ORAL | Status: DC
  Administered 2017-11-23: 1 via ORAL
  Filled 2017-11-23 (×2): qty 1

## 2017-11-23 MED ORDER — LATANOPROST 0.005 % OP SOLN
1.0000 [drp] | Freq: Every day | OPHTHALMIC | Status: DC
  Administered 2017-11-23 – 2017-11-30 (×8): 1 [drp] via OPHTHALMIC
  Filled 2017-11-23: qty 2.5

## 2017-11-23 MED ORDER — DORZOLAMIDE HCL-TIMOLOL MAL 2-0.5 % OP SOLN
1.0000 [drp] | Freq: Two times a day (BID) | OPHTHALMIC | Status: DC
  Administered 2017-11-23 – 2017-12-01 (×17): 1 [drp] via OPHTHALMIC
  Filled 2017-11-23: qty 10

## 2017-11-23 MED ORDER — FUROSEMIDE 80 MG PO TABS
80.0000 mg | ORAL_TABLET | Freq: Every day | ORAL | Status: DC
  Administered 2017-11-23: 80 mg via ORAL
  Filled 2017-11-23: qty 1

## 2017-11-23 MED ORDER — SODIUM CHLORIDE 0.9 % IV SOLN
3.0000 g | Freq: Two times a day (BID) | INTRAVENOUS | Status: DC
  Administered 2017-11-23 – 2017-11-24 (×3): 3 g via INTRAVENOUS
  Filled 2017-11-23 (×4): qty 3

## 2017-11-23 MED ORDER — LINEZOLID 600 MG/300ML IV SOLN
600.0000 mg | Freq: Two times a day (BID) | INTRAVENOUS | Status: DC
  Administered 2017-11-23 – 2017-11-27 (×9): 600 mg via INTRAVENOUS
  Filled 2017-11-23 (×10): qty 300

## 2017-11-23 MED ORDER — EMTRICITABINE-TENOFOVIR AF 200-25 MG PO TABS
1.0000 | ORAL_TABLET | Freq: Every day | ORAL | Status: DC
  Administered 2017-11-23 – 2017-11-25 (×3): 1 via ORAL
  Filled 2017-11-23 (×4): qty 1

## 2017-11-23 NOTE — Progress Notes (Signed)
Transported from ED to 2T55 without complications

## 2017-11-23 NOTE — Progress Notes (Signed)
Pharmacy Antibiotic Note  [facility] [name] is a 76 y.o. male admitted on 11/22/2017 with pneumonia.  Pharmacy has been consulted for Vancomycin dosing. Presents to ED with shortness of breath, cough, and hypoxia. PMH with HIV/AIDS. CXR with multi-focal PNA. Acute on chronic renal failure.   Plan: Vancomycin 1500 mg IV x 1, then 1000 mg IV q48h Ceftriaxone/Azithromycin per MD  Trend WBC, temp, renal function  F/U infectious work-up Drug levels as indicated   Temp (24hrs), Avg:98 F (36.7 C), Min:98 F (36.7 C), Max:98 F (36.7 C)  Recent Labs  Lab 11/22/17 2224 11/22/17 2230  WBC 17.1*  --   CREATININE 4.82* 4.90*  LATICACIDVEN  --  1.05    CrCl cannot be calculated (Unknown ideal weight.).    Allergies  Allergen Reactions  . Codeine Other (See Comments)    Causes constipation.   Blanchard Kelch [Losartan Potassium] Other (See Comments)    constipation    Narda Bonds 11/23/2017 1:40 AM

## 2017-11-23 NOTE — Progress Notes (Signed)
[facility]                                                                              Patient Demographics  David [name], is a 76 y.o. male, DOB - 05-31-1941, RCB:[id number]  Admit date - 11/22/2017   Admitting Physician Vianne Bulls, MD  Outpatient Primary MD for the patient is [name], NP  Outpatient specialists:   LOS - 0  days   Medical records reviewed and are as summarized below:    Chief Complaint  Patient presents with  . 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  Patient reported he had been developing dyspnea with minimal exertion over the past 2 days.  EMS found him with hypoxia, O2 sats in 70s.  Chest x-ray showed extensive consolidation throughout the right middle lobe and lower lobes, left base, multifocal pneumonia.  WBC count 17.1, lactic acid 1.0, creatinine 4.8 up from 3.8 in 8/18.  Patient was initially placed on BiPAP.  Assessment & Plan    Principal Problem:   Sepsis due to multifocal pneumonia ([place]), acute hypoxic respiratory failure -Patient met sepsis criteria with leukocytosis, tachypnea, hypoxia, acute respiratory distress, multifocal pneumonia.   -Blood cultures negative so far, follow sputum cultures, influenza panel negative. -Given history of HIV/AIDS, immunocompromised, hypoxia, patient was placed on Bactrim, IV steroids.  Discussed with ID, Dr. Drucilla Schmidt, recommended to discontinue Bactrim due to renal insufficiency and low probability of PCP pneumonia -Discontinued vancomycin per ID recommendations, start IV Zyvox, for now continue IV Rocephin and Zithromax, follow further recommendations from ID. -Off BiPAP, O2 sats 90% on 5 L    Active Problems: Acute on CKD (chronic kidney disease) stage 4, GFR 15-29 ml/min (HCC) -Creatinine 4.82 on admission, up from 3.8 in 8/18 -Received IV Lasix in ED, hold further Lasix, discontinue IV vancomycin, Bactrim -Follow creatinine closely, avoid nephrotoxins    Essential hypertension -Continue Coreg, Norvasc, BP stable    Chronic combined systolic and diastolic heart failure (HCC) -Currently compensated, follow I's and O's closely, -Hold further Lasix until creatinine improves - chest x-ray showed extensive consolidation, no significant pulmonary edema -2D echo 9/18 showed EF of 25-30%, diffuse hypokinesis, grade 1 diastolic dysfunction.  No CAD on cath. -Continue Coreg    Macrocytic anemia -Obtain B12, folate  HIV/AIDS (HCC) -Last viral load undetectable, ID consulted requested, discussed with Dr. Tommy Medal -Continue Tivicay, Descovy, adjust doses with renal function   Code Status: Full code DVT Prophylaxis: Subcu heparin Family Communication: Discussed in detail with the patient, all imaging results, lab results explained to the patient and wife at the bedside   Disposition Plan: Continue monitoring in stepdown unit today  Time Spent in minutes   35 minutes  Procedures:    Consultants:   Infectious disease  Antimicrobials:   IV vancomycin 12/21-> 12/21  IV Zyvox 12/21>>  IV Zithromax 12/21>  IV Rocephin 12/21>>  Medications  Scheduled Meds: . amLODipine  10 mg Oral Daily  . aspirin  325 mg Oral QPM  . brimonidine  1 drop Left Eye BID  . carvedilol  25 mg Oral BID WC  . dolutegravir  50 mg Oral Daily  . dorzolamide-timolol  1 drop Left Eye BID  . emtricitabine-tenofovir AF  1 tablet Oral Daily  . famotidine  20 mg Oral BID  . heparin  5,000 Units Subcutaneous Q8H  . latanoprost  1 drop Left Eye QHS  . methylPREDNISolone (SOLU-MEDROL) injection  40 mg Intravenous BID  . multivitamin  1 tablet Oral QHS  . sodium chloride flush  3 mL  Intravenous Q12H   Continuous Infusions: . sodium chloride    . azithromycin    . cefTRIAXone (ROCEPHIN)  IV    . linezolid (ZYVOX) IV     PRN Meds:.sodium chloride, hydrALAZINE, oxyCODONE-acetaminophen, sodium chloride flush   Antibiotics   Anti-infectives (From admission, onward)   Start     Dose/Rate Route Frequency Ordered Stop   11/24/17 2200  vancomycin (VANCOCIN) IVPB 1000 mg/200 mL premix  Status:  Discontinued     1,000 mg 200 mL/hr over 60 Minutes Intravenous Every 48 hours 11/23/17 0143 11/23/17 1040   11/23/17 2200  cefTRIAXone (ROCEPHIN) 1 g in dextrose 5 % 50 mL IVPB     1 g 100 mL/hr over 30 Minutes Intravenous Every 24 hours 11/23/17 0004 11/29/17 2159   11/23/17 2200  azithromycin (ZITHROMAX) 500 mg in dextrose 5 % 250 mL IVPB     500 mg 250 mL/hr over 60 Minutes Intravenous Every 24 hours 11/23/17 0004 11/29/17 2159   11/23/17 1200  dolutegravir (TIVICAY) tablet 50 mg     50 mg Oral Daily 11/23/17 0004     11/23/17 1200  emtricitabine-tenofovir AF (DESCOVY) 200-25 MG per tablet 1 tablet     1 tablet Oral Daily 11/23/17 0004     11/23/17 1045  linezolid (ZYVOX) IVPB 600 mg    Comments:  Please adjust dose according to renal function   600 mg 300 mL/hr over 60 Minutes Intravenous Every 12 hours 11/23/17 1042     11/23/17 1000  bictegravir-emtricitabine-tenofovir AF ([name]) 50-200-25 MG per tablet 1 tablet  Status:  Discontinued     1 tablet Oral Daily 11/23/17 0004 11/23/17 0439   11/23/17 0100  sulfamethoxazole-trimethoprim (BACTRIM DS,SEPTRA DS) 800-160 MG per tablet 1 tablet  Status:  Discontinued     1 tablet Oral Every 12 hours 11/23/17 0004 11/23/17 1041   11/23/17 0045  vancomycin (VANCOCIN) 1,500 mg in sodium chloride 0.9 % 500 mL IVPB     1,500 mg 250 mL/hr over 120 Minutes Intravenous  Once 11/23/17 0037 11/23/17 0328   11/22/17 2245  cefTRIAXone (ROCEPHIN) 1 g in dextrose 5 % 50 mL IVPB     1 g 100 mL/hr over 30 Minutes Intravenous  Once 11/22/17  2231 11/23/17 0041   11/22/17 2245  azithromycin (ZITHROMAX) 500 mg in dextrose 5 % 250 mL IVPB     500 mg 250 mL/hr over 60 Minutes Intravenous  Once 11/22/17 2231 11/23/17 0142        Subjective:   Brennen Camper was seen and examined today.  Shortness of breath is improving, coughing.  No fevers this morning. Patient denies dizziness, chest pain, abdominal pain, N/V/D/C, new weakness, numbess, tingling.   Objective:   Vitals:   11/23/17 0637 11/23/17 0645 11/23/17 0755 11/23/17 1024  BP:   (!) 165/89 138/83  Pulse:   89   Resp:   (!) 31   Temp:  98.3 F (36.8 C) 98.7 F (37.1 C)   TempSrc:  Oral Oral   SpO2: 97%  90%   Weight:  96.1 kg (211 lb 13.8 oz)    Height:  6' (1.829 m)      Intake/Output Summary (Last 24 hours) at 11/23/2017 1053 Last data filed at 11/23/2017 1029 Gross per 24 hour  Intake 1286 ml  Output 1255 ml  Net 31 ml     Wt Readings from Last 3 Encounters:  11/23/17 96.1 kg (211 lb 13.8 oz)  10/05/17 96.5 kg (212 lb 12.8 oz)  10/03/17 95.7 kg (211 lb)     Exam  General: Alert and oriented x 3, NAD  Eyes:   HEENT:  Atraumatic, normocephalic  Cardiovascular: S1 S2 auscultated, no rubs, murmurs or gallops. Regular rate and rhythm.  Respiratory: Bilateral diffuse rhonchi  Gastrointestinal: Soft, nontender, nondistended, + bowel sounds  Ext: no pedal edema bilaterally  Neuro: AAOx3, Cr N's II- XII. Strength 5/5 upper and lower extremities bilaterally, speech clear, sensations grossly intact  Musculoskeletal: No digital cyanosis, clubbing  Skin: No rashes  Psych: Normal affect and demeanor, alert and oriented x3    Data Reviewed:  I have personally reviewed following labs and imaging studies  Micro Results Recent Results (from the past 240 hour(s))  MRSA PCR Screening     Status: None   Collection Time: 11/23/17  6:41 AM  Result Value Ref Range Status   MRSA by PCR NEGATIVE NEGATIVE Final    Comment:        The GeneXpert MRSA  Assay (FDA approved for NASAL specimens only), is one component of a comprehensive MRSA colonization surveillance program. It is not intended to diagnose MRSA infection nor to guide or monitor treatment for MRSA infections.     Radiology Reports Mr Brain Wo Contrast  Result Date: 11/01/2017 CLINICAL DATA:  Syncope and collapse. Personal history prostate cancer EXAM: MRI HEAD WITHOUT CONTRAST TECHNIQUE: Multiplanar, multiecho pulse sequences of the brain and surrounding structures were obtained without intravenous contrast. COMPARISON:  CT head 07/31/2017 FINDINGS: Brain: Moderate atrophy.  Ventricular enlargement is stable. Negative for acute infarct. Moderate chronic ischemic changes are present throughout the white matter . Chronic lacunar infarct left pons. Slight chronic ischemic changes cerebellum. Negative for hemorrhage or mass. No fluid collection or shift of the midline structures. Vascular: Normal arterial flow voids. Skull and upper cervical spine: Negative Sinuses/Orbits: Mild mucosal edema paranasal sinuses. Bilateral cataract removal. Other: None IMPRESSION: Atrophy and moderate chronic microvascular ischemic change. No acute abnormality. Electronically Signed   By: Franchot Gallo M.D.   On: 11/01/2017 10:27   Mr Cervical Spine Wo Contrast  Result Date: 11/01/2017 CLINICAL DATA:  Hyper reflexia.  Personal history prostate cancer EXAM: MRI CERVICAL SPINE WITHOUT CONTRAST TECHNIQUE: Multiplanar, multisequence MR imaging of the cervical spine was performed. No intravenous contrast was administered. COMPARISON:  None. FINDINGS: Alignment: Image quality degraded by mild motion. Normal alignment Vertebrae: Negative for fracture or mass. Negative for metastatic disease. Cord: Normal signal and morphology Posterior Fossa, vertebral arteries, paraspinal tissues: Negative Disc levels: C2-3:  Negative C3-4: Shallow central disc protrusion and spurring causing mild cord flattening and mild  spinal stenosis C4-5: Mild disc degeneration with mild disc bulging and spurring. Mild spinal stenosis C5-6: Left foraminal encroachment due to spurring. Mild spinal stenosis. C6-7: Mild disc degeneration and spurring without significant stenosis C7-T1: Mild disc degeneration and  spurring without significant stenosis. 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 Left foraminal narrowing C5-6 due to spurring Negative for fracture or metastatic disease.  No cord compression. Electronically Signed   By: Franchot Gallo M.D.   On: 11/01/2017 10:30   Dg Chest Portable 1 View  Result Date: 11/22/2017 CLINICAL DATA:  Respiratory distress with shortness of breath EXAM: PORTABLE CHEST 1 VIEW COMPARISON:  Chest radiograph January 25, 2010 and chest CT Apr 29, 2014 FINDINGS: There is airspace consolidation throughout the right mid lower lung zones. There is also consolidation in the medial left base. There is cardiomegaly with pulmonary vascularity within normal limits. There is a small right pleural effusion. No evident adenopathy. No bone lesions. IMPRESSION: Extensive consolidation throughout much of the right mid and lower lung zones felt to represent multifocal pneumonia. Consolidation medial left base also felt to represent pneumonia. Small right pleural effusion. Cardiomegaly with pulmonary vascularity within normal limits. No adenopathy evident. Electronically Signed   By: Lowella Grip III M.D.   On: 11/22/2017 22:37    Lab Data:  CBC: Recent Labs  Lab 11/22/17 2224 11/22/17 2230 11/23/17 0327  WBC 17.1*  --  11.7*  NEUTROABS 14.5*  --  9.4*  HGB 12.5* 14.6 11.7*  HCT 39.6 43.0 36.1*  MCV 102.9*  --  100.8*  PLT 206  --  825   Basic Metabolic Panel: Recent Labs  Lab 11/22/17 2224 11/22/17 2230 11/23/17 0327  NA 137 142 139  K 4.4 4.3 4.1  CL 106 110 108  CO2 19*  --  22  GLUCOSE 219* 225* 122*  BUN 66* 58* 63*  CREATININE 4.82* 4.90*  4.71*  CALCIUM 8.5*  --  8.4*   GFR: Estimated Creatinine Clearance: 16.3 mL/min (A) (by C-G formula based on SCr of 4.71 mg/dL (H)). Liver Function Tests: Recent Labs  Lab 11/22/17 2224  AST 22  ALT 20  ALKPHOS 104  BILITOT 0.6  PROT 7.5  ALBUMIN 3.7   No results for input(s): LIPASE, AMYLASE in the last 168 hours. No results for input(s): AMMONIA in the last 168 hours. Coagulation Profile: No results for input(s): INR, PROTIME in the last 168 hours. Cardiac Enzymes: No results for input(s): CKTOTAL, CKMB, CKMBINDEX, TROPONINI in the last 168 hours. BNP (last 3 results) No results for input(s): PROBNP in the last 8760 hours. HbA1C: No results for input(s): HGBA1C in the last 72 hours. CBG: Recent Labs  Lab 11/22/17 2223  GLUCAP 198*   Lipid Profile: No results for input(s): CHOL, HDL, LDLCALC, TRIG, CHOLHDL, LDLDIRECT in the last 72 hours. Thyroid Function Tests: No results for input(s): TSH, T4TOTAL, FREET4, T3FREE, THYROIDAB in the last 72 hours. Anemia Panel: No results for input(s): VITAMINB12, FOLATE, FERRITIN, TIBC, IRON, RETICCTPCT in the last 72 hours. Urine analysis:    Component Value Date/Time   COLORURINE YELLOW 05/11/2014 [place] 05/11/2014 1352   LABSPEC 1.010 05/11/2014 1352   PHURINE 5.0 05/11/2014 1352   GLUCOSEU NEGATIVE 05/11/2014 1352   HGBUR TRACE (A) 05/11/2014 1352   BILIRUBINUR NEGATIVE 05/11/2014 1352   BILIRUBINUR negative 04/16/2013 1447   KETONESUR NEGATIVE 05/11/2014 1352   PROTEINUR 100 (A) 05/11/2014 1352   UROBILINOGEN 0.2 05/11/2014 1352   NITRITE NEGATIVE 05/11/2014 1352   LEUKOCYTESUR NEGATIVE 05/11/2014 1352     Ercell Perlman M.D. [facility] 11/23/2017, 10:53 AM  Pager: [phone] Between 7am to 7pm - call Pager - [phone]  After 7pm go  to [email or web address] - password [email or web address]  Call night coverage person covering after 7pm

## 2017-11-23 NOTE — H&P (Signed)
History and Physical    David VICKREY GQQ:[id number] DOB: 14-Nov-1941 DOA: 11/22/2017  PCP: [name], NP   Patient coming from: Home  Chief Complaint: SOB, cough, hypoxia   HPI: David [name] is a 76 y.o. male with medical history significant for HIV/AIDS (CD4 310 and VL undetectable in Oct '18), hypertension, chronic combined systolic/diastolic CHF, prostate cancer status post brachytherapy, and chronic kidney disease stage IV, now presenting to the emergency department for evaluation of acute respiratory distress with cough and hypoxia.  Patient reports that he has been developing dyspnea with minimal exertion and productive cough over the past day before worsening acutely this evening.  Began to develop a general malaise friend's house, and on the way home, had acute worsening in his dyspnea and pulled over the side of the road where he apparently vomited.  EMS found him to be saturating in the 70s, we gave him a continuous albuterol treatment, Atrovent, nitroglycerin, placed him on CPAP, and transported him to the hospital.  He denies any chest pain or palpitations and denies any lower extremity swelling or tenderness.  ED Course: Upon arrival to the ED, patient is found to be afebrile, saturating adequately on BiPAP, tachypneic to 40, slightly tachycardic, and with stable blood pressure.  EKG features a sinus tachycardia with rate 100, PVCs, LVH with repolarization abnormality, and QTc interval of 519 ms.  Chest x-ray features extensive consolidation throughout the right middle and lower lobes, as well as left base, likely representing a multifocal pneumonia.  It is notable for a BUN of 66 and creatinine 4.82, up from 3.8 last August.  CBC is notable for leukocytosis to 17,100.  Lactic acid is reassuring at 1.05, troponin is within normal limits, and BNP is elevated to 667.  Patient was given 60 mg IV Lasix, azithromycin, and Rocephin in the emergency department.  He was started on  BiPAP.  Work of breathing has improved, he remains hemodynamically stable and will be admitted to the stepdown unit for ongoing evaluation and management of multifocal pneumonia with acute hypoxic respiratory failure.  Review of Systems:  All other systems reviewed and apart from HPI, are negative.  Past Medical History:  Diagnosis Date  . Anemia, iron deficiency 11/15/2011  . Arthritis   . Cancer [facility])    hx of prostate; s/p radioactive seed implant 10/2009 Dr Janice Norrie  . Chronic kidney disease   . Glaucoma   . Heart murmur    "mild" per pt  . History of cardiac cath 2004   negative for CAD  . History of myocardial perfusion scan 02/2010   negative for coronary insufficiency (LVEF 27%)  . HIV infection ([place])   . Hyperlipidemia   . Hypertension    followed by [facility] and V[facility] (Dr Dani Gobble Croitoru)  . Nonischemic cardiomyopathy ([place])   . Sinus bradycardia   . Stroke [facility])    "mini stroke" years ago  . Systolic and diastolic CHF, acute ([place]) 06/2010   felt to be secondary to hypertensive cardiomyopathy    Past Surgical History:  Procedure Laterality Date  . AV FISTULA PLACEMENT Right 10/13/2016   Procedure: ARTERIOVENOUS (AV) FISTULA CREATION;  Surgeon: [name], MD;  Location: [place];  Service: Vascular;  Laterality: Right;  . BASCILIC VEIN TRANSPOSITION Left 06/24/2015   Procedure: BASILIC VEIN TRANSPOSITION;  Surgeon: Mal Misty, MD;  Location: [place];  Service: Vascular;  Laterality: Left;  . CARDIAC CATHETERIZATION  02/04/2003   mildly depressed LV systolic fx  EF 50%,normal coronaries/abdominal aorta/renal arteries.  . COLONOSCOPY    . ESOPHAGOGASTRODUODENOSCOPY (EGD) WITH PROPOFOL N/A 05/05/2014   Procedure: ESOPHAGOGASTRODUODENOSCOPY (EGD) WITH PROPOFOL;  Surgeon: Missy Sabins, MD;  Location: [facility];  Service: Endoscopy;  Laterality: N/A;  . EYE SURGERY Bilateral    cataract surgery   . EYE SURGERY Bilateral    glaucoma surgery  . NM MYOCAR PERF WALL  MOTION  02/21/2010   normal  . RADIOACTIVE SEED IMPLANT  2010   prostate cancer  . US ECHOCARDIOGRAPHY  02/06/2012   mild LVH,LA mod. dilated,mild-mod. 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 AI.     reports that he quit smoking about 11 years ago. he has never used smokeless tobacco. He reports that he drinks alcohol. He reports that he does not use drugs.  Allergies  Allergen Reactions  . Codeine Other (See Comments)    Causes constipation.   Blanchard Kelch [Losartan Potassium] Other (See Comments)    constipation    Family History  Problem Relation Age of Onset  . Hypertension Mother   . Thyroid disease Mother   . Cholelithiasis Daughter   . Cholelithiasis Son   . Hypertension Maternal Grandmother   . Diabetes Maternal Grandmother   . Heart attack Neg Hx   . Hyperlipidemia Neg Hx      Prior to Admission medications   Medication Sig Start Date End Date Taking? Authorizing Provider  acetaminophen (TYLENOL) 500 MG tablet Take 500 mg by mouth every 4 (four) hours as needed for moderate pain or headache.     [provider]  amLODipine (NORVASC) 10 MG tablet TAKE 1 TABLET EVERY DAY 01/09/17   Croitoru, Mihai, MD  aspirin 325 MG EC tablet Take 325 mg by mouth every evening.     [provider]  [name] 50-200-25 MG TABS tablet Take 1 tablet by mouth daily. 08/17/17   [provider]  brimonidine (ALPHAGAN) 0.15 % ophthalmic solution Place 1 drop into the left eye 2 (two) times daily.  11/18/15   [provider]  carvedilol (COREG) 25 MG tablet TAKE 1 TABLET TWICE DAILY 01/09/17   Croitoru, Mihai, MD  dolutegravir (TIVICAY) 50 MG tablet Take 1 tablet (50 mg total) by mouth daily. 08/22/17   Truman Hayward, MD  dorzolamide-timolol (COSOPT) 22.3-6.8 MG/ML ophthalmic solution Place 1 drop into the left eye 2 (two) times daily.     [provider]  emtricitabine-tenofovir AF (DESCOVY) 200-25 MG tablet Take 1 tablet by  mouth daily. 08/22/17   Truman Hayward, MD  furosemide (LASIX) 40 MG tablet Take 2 tablets (80 mg total) by mouth daily. 09/14/17 09/09/18  Croitoru, Mihai, MD  latanoprost (XALATAN) 0.005 % ophthalmic solution Place 1 drop into the left eye at bedtime. Use 1 drop in left eye at bedtime    [provider]  multivitamin (RENA-VIT) TABS tablet Take 1 tablet by mouth daily. 07/20/16   [provider]  oxyCODONE-acetaminophen (PERCOCET/ROXICET) 5-325 MG tablet Take 1 tablet by mouth every 6 (six) hours as needed. 10/13/16   Ulyses Amor, PA-C  ranitidine (ZANTAC) 150 MG tablet Take 1 tablet (150 mg total) by mouth 2 (two) times daily. 08/02/17   [name], NP  Vitamin D, Ergocalciferol, (DRISDOL) 50000 UNITS CAPS capsule TK ONE C PO Q  WEEK 04/10/15   [provider]    Physical Exam: Vitals:   11/22/17 2315 11/22/17 2330 11/22/17 2337 11/22/17 2351  BP: 139/89   Marland Kitchen)  150/90  Pulse:  83 87 85  Resp: (!) 27 (!) 29 (!) 25 (!) 26  Temp:      TempSrc:      SpO2:  98% 99% 95%      Constitutional: On BiPAP, tachypneic, dyspneic with speech, no pallor or diaphoresis Eyes: PERTLA, lids and conjunctivae normal ENMT: Mucous membranes are moist. Posterior pharynx clear of any exudate or lesions.   Neck: normal, supple, no masses, no thyromegaly Respiratory: Rhonchi throughout right side. Increased WOB. No cyanosis.   Cardiovascular: S1 & S2 heard, regular rate and rhythm. No extremity edema.  Abdomen: No distension, no tenderness, no masses palpated. Bowel sounds normal.  Musculoskeletal: no clubbing / cyanosis. No joint deformity upper and lower extremities.  Skin: no significant rashes, lesions, ulcers. Warm, dry, well-perfused. Neurologic: CN 2-12 grossly intact. Sensation intact. Strength 5/5 in all 4 limbs.  Psychiatric: Alert and oriented x 3. Calm, cooperative.     Labs on Admission: I have personally reviewed following labs and imaging  studies  CBC: Recent Labs  Lab 11/22/17 2224 11/22/17 2230  WBC 17.1*  --   NEUTROABS 14.5*  --   HGB 12.5* 14.6  HCT 39.6 43.0  MCV 102.9*  --   PLT 206  --    Basic Metabolic Panel: Recent Labs  Lab 11/22/17 2224 11/22/17 2230  NA 137 142  K 4.4 4.3  CL 106 110  CO2 19*  --   GLUCOSE 219* 225*  BUN 66* 58*  CREATININE 4.82* 4.90*  CALCIUM 8.5*  --    GFR: CrCl cannot be calculated (Unknown ideal weight.). Liver Function Tests: Recent Labs  Lab 11/22/17 2224  AST 22  ALT 20  ALKPHOS 104  BILITOT 0.6  PROT 7.5  ALBUMIN 3.7   No results for input(s): LIPASE, AMYLASE in the last 168 hours. No results for input(s): AMMONIA in the last 168 hours. Coagulation Profile: No results for input(s): INR, PROTIME in the last 168 hours. Cardiac Enzymes: No results for input(s): CKTOTAL, CKMB, CKMBINDEX, TROPONINI in the last 168 hours. BNP (last 3 results) No results for input(s): PROBNP in the last 8760 hours. HbA1C: No results for input(s): HGBA1C in the last 72 hours. CBG: Recent Labs  Lab 11/22/17 2223  GLUCAP 198*   Lipid Profile: No results for input(s): CHOL, HDL, LDLCALC, TRIG, CHOLHDL, LDLDIRECT in the last 72 hours. Thyroid Function Tests: No results for input(s): TSH, T4TOTAL, FREET4, T3FREE, THYROIDAB in the last 72 hours. Anemia Panel: No results for input(s): VITAMINB12, FOLATE, FERRITIN, TIBC, IRON, RETICCTPCT in the last 72 hours. Urine analysis:    Component Value Date/Time   COLORURINE YELLOW 05/11/2014 [place] 05/11/2014 1352   LABSPEC 1.010 05/11/2014 1352   PHURINE 5.0 05/11/2014 1352   GLUCOSEU NEGATIVE 05/11/2014 1352   HGBUR TRACE (A) 05/11/2014 1352   BILIRUBINUR NEGATIVE 05/11/2014 1352   BILIRUBINUR negative 04/16/2013 1447   KETONESUR NEGATIVE 05/11/2014 1352   PROTEINUR 100 (A) 05/11/2014 1352   UROBILINOGEN 0.2 05/11/2014 1352   NITRITE NEGATIVE 05/11/2014 1352   LEUKOCYTESUR NEGATIVE 05/11/2014 1352    Sepsis Labs: @LABRCNTIP (procalcitonin:4,lacticidven:4) )No results found for this or any previous visit (from the past 240 hour(s)).   Radiological Exams on Admission: Dg Chest Portable 1 View  Result Date: 11/22/2017 CLINICAL DATA:  Respiratory distress with shortness of breath EXAM: PORTABLE CHEST 1 VIEW COMPARISON:  Chest radiograph January 25, 2010 and chest CT Apr 29, 2014 FINDINGS: There is airspace consolidation throughout the  right mid lower lung zones. There is also consolidation in the medial left base. There is cardiomegaly with pulmonary vascularity within normal limits. There is a small right pleural effusion. No evident adenopathy. No bone lesions. IMPRESSION: Extensive consolidation throughout much of the right mid and lower lung zones felt to represent multifocal pneumonia. Consolidation medial left base also felt to represent pneumonia. Small right pleural effusion. Cardiomegaly with pulmonary vascularity within normal limits. No adenopathy evident. Electronically Signed   By: Lowella Grip III M.D.   On: 11/22/2017 22:37    EKG: Independently reviewed. Sinus tachycardia (rate 100), PVC, LVH with repolarization abnormality, QTc 519 ms.   Assessment/Plan  1. Multifocal pneumonia; acute hypoxic respiratory failure  - Pt presents with productive cough, SOB, acute respiratory distress with sat in 70's on EMS arrival  - Marked leukocytosis, AKI, tachypnea, and hypoxia noted; CXR consistent with multifocal PNA  - Blood and sputum cultures ordered from ED, will check strep pneumo and legionella antigens  - Continue Rocephin and azithromycin, add vancomycin given critical illness and requirement for SDU, and add Bactrim for now to cover pneumocystis, add adjunctive glucocorticoid    2. HIV/AIDS  - Follows with ID; last CD4 310 and VL undetectable in mid-October 2018  - Continue [place], [place], Descovy   3. Acute kidney injury superimposed on CKD IV  - SCr is 4.82 on  admission, up from 3.8 last August  - Follows with nephrology outpatient  - Follow fluid-status closely, renally-dose medications, avoid nephrotoxins   4. Hypertension  - BP at goal  - Continue Coreg and Norvasc as tolerated    5. Chronic combined systolic/diastolic CHF  - TTE (5/0/03) with EF 25-30%, diffuse HK, grade 1 dd, mild AS, mild MR, mild LAE - No CAD on cath  - Appears well-compensated, though BNP elevated  - Treated in ED with Lasix 60 mg IV x1  - Check wt, SLIV, follow daily wt and I/O's, continue Coreg, continue home-dose Lasix 80 mg daily for now     DVT prophylaxis: sq heparin Code Status: Full  Family Communication: Discussed with patient Disposition Plan: Admit to SDU Consults called: None Admission status: Inpatient    Vianne Bulls, MD [facility] Pager [phone]  If 7PM-7AM, please contact night-coverage [email or web address] Password [facility]  11/23/2017, 12:04 AM

## 2017-11-23 NOTE — Progress Notes (Signed)
Pt off BIPAP at this time and resting comfortably on 5L [place].  BIPAP at bedside. RT to cont to monitor.

## 2017-11-23 NOTE — Plan of Care (Signed)
Pt is A&Ox4; resting; denies pain; denies SOB; BP inc but controlled with meds; IV antibiotic given; will continue to monitor.  [place]  Masson Nalepa, RN

## 2017-11-23 NOTE — Progress Notes (Signed)
Pharmacy Antibiotic Note  David [name] is a 76 y.o. male admitted on 11/22/2017 with SOB, cough and hypoxia.  Patient was started on azithromycin, ceftriaxone and linezolid.  Pharmacy now consulted to transition from ceftriaxone to Unasyn for aspiration PNA.  MD continuing linezolid for MRSA coverage with plan to discontinue azithromycin if Legionella antigen is negative.  Patient has CKD and current CrCL is 16 ml/min.  He is afebrile and his WBC is improving.  Plan: Unasyn 3gm IV Q12H Monitor renal fxn, micro data, clinical progress to de-escalate   Height: 6' (182.9 cm) Weight: 211 lb 13.8 oz (96.1 kg) IBW/kg (Calculated) : 77.6  Temp (24hrs), Avg:98.5 F (36.9 C), Min:98 F (36.7 C), Max:98.9 F (37.2 C)  Recent Labs  Lab 11/22/17 2224 11/22/17 2230 11/23/17 0327  WBC 17.1*  --  11.7*  CREATININE 4.82* 4.90* 4.71*  LATICACIDVEN  --  1.05  --     Estimated Creatinine Clearance: 16.3 mL/min (A) (by C-G formula based on SCr of 4.71 mg/dL (H)).    Allergies  Allergen Reactions  . Codeine Other (See Comments)    Causes constipation.   Blanchard Kelch [Losartan Potassium] Other (See Comments)    constipation     12/21 ctx>> 12/21 12/21 azithro>>  12/21 vanc>>12/21 12/21 linezolid>> 12/21 Unasyn >>  12/21 flu: neg 12/21 legionella: ip 12/21 strep pneumo: neg 12/20 BCx: 12/21 mrsa pcr: neg   Eiley Mcginnity D. Mina Marble, PharmD, BCPS Pager:  [phone] 11/23/2017, 4:12 PM

## 2017-11-23 NOTE — Consult Note (Signed)
Date of Admission:  11/22/2017          Reason for Consult: Pneumonia/HIV    Referring Provider: Dr. Tana Coast   Assessment: Right Lower and Middle Lobe Pneumonia - suspect aspiration HIV  His history and CXR findings are suggestive of aspiration pneumonia. There is low suspicion for PJP as he has had well-controlled HIV and reports adherence to his antiretroviral medications. Clinically he is improving and resting well on supplemental oxygen. His CD4 count is decreased to 170 which may be secondary to his acute illness.   He does have heart failure with a reduced ejection fraction, however does not appear to be volume overloaded or in acute exacerbation of his HFrEF.  Strep pneumo Ag >> negative Legionella antigen >>  Influenza A/B >> negative Blood Cx 12/20 >>  Plan: 1. Discontinue Ceftriaxone 2. Discontinue Solumedrol 3. Start Unasyn for suspected aspiration pneumonia 4. Continue Linezolid for MRSA coverage for now (no Vancomycin given CKD 4-5) 5. Okay to continue Azithromycin for now - discontinue if Legionella antigen negative - low suspicion for legionella at this time 6. Obtain 2V CXR 7. Wean O2 down as able 8. Continue Tivicay and Descovy for HIV 9. D/c contact precautions with negative influenza and MRSA nasal swab  Principal Problem:   Sepsis due to pneumonia [facility]) Active Problems:   Essential hypertension   Chronic combined systolic and diastolic heart failure (HCC)   Macrocytic anemia   CKD (chronic kidney disease) stage 4, GFR 15-29 ml/min (HCC)   AIDS (HCC)   Acute renal failure superimposed on stage 4 chronic kidney disease (HCC)   Scheduled Meds: . amLODipine  10 mg Oral Daily  . aspirin  325 mg Oral QPM  . brimonidine  1 drop Left Eye BID  . carvedilol  25 mg Oral BID WC  . dolutegravir  50 mg Oral Daily  . dorzolamide-timolol  1 drop Left Eye BID  . emtricitabine-tenofovir AF  1 tablet Oral Daily  . [START ON 11/24/2017] famotidine  20 mg Oral  Daily  . heparin  5,000 Units Subcutaneous Q8H  . latanoprost  1 drop Left Eye QHS  . multivitamin  1 tablet Oral QHS  . sodium chloride flush  3 mL Intravenous Q12H   Continuous Infusions: . sodium chloride    . azithromycin    . linezolid (ZYVOX) IV Stopped (11/23/17 1255)   PRN Meds:.sodium chloride, hydrALAZINE, oxyCODONE-acetaminophen, sodium chloride flush  HPI: David [name] is a 76 y.o. male living with HIV (CD4 310, VL <20/undetectable on 09/19/17), HFrEF (EF 25-30% by TTE 08/2017), HTN, CKD 4-5, prior syphilis, and Prostate Cancer s/p Brachytherapy who presented to the ED with acute respiratory distress. History is provided by [name].  He says over the last 2 months he has experienced intermittent episodes of fainting without obvious cause. He was evaluated by neurology and had an MRI Brain and Cervical Spine without obvious cause. He says he has had issues with cough over the last 2 months as well with an episode of choking almost 2 months ago.  He says he has otherwise felt well until yesterday evening. He was at his friend's house and began to have shortness of breath and a "gagging" and choking sensation. His friend called EMS and he was taken to the ED. He was noted to be in acute hypoxic respiratory distress and required BiPAP with improvement in his symptoms. He denies any fevers, chills, diaphoresis, myalgias, abdominal pain, or diarrhea.  He has  a history of HFrEF and denies any new orthopnea, PND, or abdominal/leg swelling. He reports good urine output on Lasix. He denies any sick contacts. He reports adherence to his Tivicay and Descovy daily without any missed doses.  In the ED, a CXR showed a multifocal pneumonia most prominently in the right lower and middle lobes. He had a leukocytosis of 17.1k. He was afebrile. He was initially started on Ceftriaxone and Azithromycin. Vancomycin was added on due to his critical illness. There was concern for PJP given his diagnosis  of HIV and he was subsequently started on Bactrim as well as IV solu-medrol.   Review of Systems: Review of Systems  Constitutional: Negative for chills, diaphoresis, fever and malaise/fatigue.  Respiratory: Positive for cough, sputum production and shortness of breath. Negative for hemoptysis.   Cardiovascular: Negative for chest pain, palpitations and leg swelling.       Chronic orthopnea, unchanged  Gastrointestinal: Negative for abdominal pain, nausea and vomiting.       Gagging, choking  Genitourinary: Positive for frequency. Negative for dysuria.  Musculoskeletal: Negative for myalgias.  Skin: Negative for rash.  Neurological:       Intermittent fainting episodes last 2 months    Past Medical History:  Diagnosis Date  . Anemia, iron deficiency 11/15/2011  . Arthritis   . Cancer [facility])    hx of prostate; s/p radioactive seed implant 10/2009 Dr Janice Norrie  . Chronic kidney disease   . Glaucoma   . Heart murmur    "mild" per pt  . History of cardiac cath 2004   negative for CAD  . History of myocardial perfusion scan 02/2010   negative for coronary insufficiency (LVEF 27%)  . HIV infection ([place])   . Hyperlipidemia   . Hypertension    followed by [name] and Vascular (Dr Dani Gobble Croitoru)  . Nonischemic cardiomyopathy ([place])   . Sinus bradycardia   . Stroke [facility])    "mini stroke" years ago  . Systolic and diastolic CHF, acute ([place]) 06/2010   felt to be secondary to hypertensive cardiomyopathy    Social History   Tobacco Use  . Smoking status: Former Smoker    Last attempt to quit: 12/04/2005    Years since quitting: 11.9  . Smokeless tobacco: Never Used  Substance Use Topics  . Alcohol use: Yes    Comment: once a week-wine  . Drug use: No    Family History  Problem Relation Age of Onset  . Hypertension Mother   . Thyroid disease Mother   . Cholelithiasis Daughter   . Cholelithiasis Son   . Hypertension Maternal Grandmother   . Diabetes Maternal  Grandmother   . Heart attack Neg Hx   . Hyperlipidemia Neg Hx    Allergies  Allergen Reactions  . Codeine Other (See Comments)    Causes constipation.   Blanchard Kelch [Losartan Potassium] Other (See Comments)    constipation    OBJECTIVE: Blood pressure 132/80, pulse 82, temperature 98.9 F (37.2 C), temperature source Oral, resp. rate (!) 25, height 6' (1.829 m), weight 211 lb 13.8 oz (96.1 kg), SpO2 100 %.  Physical Exam  Constitutional: He is oriented to person, place, and time and well-developed, well-nourished, and in no distress. No distress.  HENT:  Head: Normocephalic and atraumatic.  Cardiovascular: Normal rate and regular rhythm.  No murmur heard. Pulmonary/Chest: Effort normal. No stridor. No respiratory distress.  Faint/fine inspiratory crackles bilaterally  Abdominal: Soft. He exhibits no distension. There is no tenderness.  Musculoskeletal: He exhibits no edema.  Neurological: He is alert and oriented to person, place, and time.  Skin: Skin is warm. He is not diaphoretic.  Psychiatric: Affect normal.    Lab Results Lab Results  Component Value Date   WBC 11.7 (H) 11/23/2017   HGB 11.7 (L) 11/23/2017   HCT 36.1 (L) 11/23/2017   MCV 100.8 (H) 11/23/2017   PLT 238 11/23/2017    Lab Results  Component Value Date   CREATININE 4.71 (H) 11/23/2017   BUN 63 (H) 11/23/2017   NA 139 11/23/2017   K 4.1 11/23/2017   CL 108 11/23/2017   CO2 22 11/23/2017    Lab Results  Component Value Date   ALT 20 11/22/2017   AST 22 11/22/2017   ALKPHOS 104 11/22/2017   BILITOT 0.6 11/22/2017     Microbiology: Recent Results (from the past 240 hour(s))  MRSA PCR Screening     Status: None   Collection Time: 11/23/17  6:41 AM  Result Value Ref Range Status   MRSA by PCR NEGATIVE NEGATIVE Final    Comment:        The GeneXpert MRSA Assay (FDA approved for NASAL specimens only), is one component of a comprehensive MRSA colonization surveillance program. It is  not intended to diagnose MRSA infection nor to guide or monitor treatment for MRSA infections.     Zada Finders, MD Internal Medicine PGY-3  11/23/2017, 4:03 PM

## 2017-11-24 ENCOUNTER — Inpatient Hospital Stay (HOSPITAL_COMMUNITY): Payer: Medicare HMO

## 2017-11-24 ENCOUNTER — Other Ambulatory Visit: Payer: Self-pay

## 2017-11-24 DIAGNOSIS — A419 Sepsis, unspecified organism: Principal | ICD-10-CM

## 2017-11-24 DIAGNOSIS — J189 Pneumonia, unspecified organism: Secondary | ICD-10-CM

## 2017-11-24 DIAGNOSIS — J9621 Acute and chronic respiratory failure with hypoxia: Secondary | ICD-10-CM

## 2017-11-24 DIAGNOSIS — J9622 Acute and chronic respiratory failure with hypercapnia: Secondary | ICD-10-CM

## 2017-11-24 DIAGNOSIS — I5023 Acute on chronic systolic (congestive) heart failure: Secondary | ICD-10-CM

## 2017-11-24 LAB — BASIC METABOLIC PANEL
ANION GAP: 9 (ref 5–15)
BUN: 66 mg/dL — ABNORMAL HIGH (ref 6–20)
CHLORIDE: 107 mmol/L (ref 101–111)
CO2: 22 mmol/L (ref 22–32)
Calcium: 8.5 mg/dL — ABNORMAL LOW (ref 8.9–10.3)
Creatinine, Ser: 4.61 mg/dL — ABNORMAL HIGH (ref 0.61–1.24)
GFR, EST AFRICAN AMERICAN: 13 mL/min — AB (ref >60)
GFR, EST NON AFRICAN AMERICAN: 11 mL/min — AB (ref >60)
Glucose, Bld: 139 mg/dL — ABNORMAL HIGH (ref 65–99)
POTASSIUM: 4.5 mmol/L (ref 3.5–5.1)
SODIUM: 138 mmol/L (ref 135–145)

## 2017-11-24 LAB — BLOOD GAS, ARTERIAL
Acid-base deficit: 3.9 mmol/L — ABNORMAL HIGH (ref 0.0–2.0)
BICARBONATE: 22.7 mmol/L (ref 20.0–28.0)
Delivery systems: POSITIVE
Drawn by: 105521
EXPIRATORY PAP: 8
FIO2: 0.8
INSPIRATORY PAP: 16
O2 SAT: 89.5 %
PATIENT TEMPERATURE: 98.2
PCO2 ART: 56.7 mmHg — AB (ref 32.0–48.0)
PH ART: 7.225 — AB (ref 7.350–7.450)
PO2 ART: 69.4 mmHg — AB (ref 83.0–108.0)

## 2017-11-24 LAB — POCT I-STAT 3, VENOUS BLOOD GAS (G3P V)
ACID-BASE DEFICIT: 5 mmol/L — AB (ref 0.0–2.0)
BICARBONATE: 20.8 mmol/L (ref 20.0–28.0)
O2 SAT: 71 %
PCO2 VEN: 38.6 mmHg — AB (ref 44.0–60.0)
PO2 VEN: 39 mmHg (ref 32.0–45.0)
Patient temperature: 98
TCO2: 22 mmol/L (ref 22–32)
pH, Ven: 7.337 (ref 7.250–7.430)

## 2017-11-24 LAB — POCT I-STAT 3, ART BLOOD GAS (G3+)
Acid-base deficit: 5 mmol/L — ABNORMAL HIGH (ref 0.0–2.0)
BICARBONATE: 20.5 mmol/L (ref 20.0–28.0)
O2 Saturation: 93 %
PO2 ART: 71 mmHg — AB (ref 83.0–108.0)
TCO2: 22 mmol/L (ref 22–32)
pCO2 arterial: 38.1 mmHg (ref 32.0–48.0)
pH, Arterial: 7.341 — ABNORMAL LOW (ref 7.350–7.450)

## 2017-11-24 LAB — CBC
HCT: 32.8 % — ABNORMAL LOW (ref 39.0–52.0)
HEMOGLOBIN: 10.8 g/dL — AB (ref 13.0–17.0)
MCH: 33 pg (ref 26.0–34.0)
MCHC: 32.9 g/dL (ref 30.0–36.0)
MCV: 100.3 fL — ABNORMAL HIGH (ref 78.0–100.0)
PLATELETS: 210 10*3/uL (ref 150–400)
RBC: 3.27 MIL/uL — AB (ref 4.22–5.81)
RDW: 13.6 % (ref 11.5–15.5)
WBC: 13.9 10*3/uL — AB (ref 4.0–10.5)

## 2017-11-24 LAB — UREA NITROGEN, URINE: Urea Nitrogen, Ur: 248 mg/dL

## 2017-11-24 LAB — LACTIC ACID, PLASMA: LACTIC ACID, VENOUS: 1.4 mmol/L (ref 0.5–1.9)

## 2017-11-24 LAB — COOXEMETRY PANEL
CARBOXYHEMOGLOBIN: 1.1 % (ref 0.5–1.5)
Methemoglobin: 1.3 % (ref 0.0–1.5)
O2 Saturation: 78.6 %
Total hemoglobin: 11 g/dL — ABNORMAL LOW (ref 12.0–16.0)

## 2017-11-24 LAB — PROCALCITONIN: PROCALCITONIN: 10.39 ng/mL

## 2017-11-24 LAB — LEGIONELLA PNEUMOPHILA SEROGP 1 UR AG: L. pneumophila Serogp 1 Ur Ag: NEGATIVE

## 2017-11-24 LAB — HIV-1 RNA QUANT-NO REFLEX-BLD: LOG10 HIV-1 RNA: UNDETERMINED log10copy/mL

## 2017-11-24 LAB — TROPONIN I: TROPONIN I: 0.06 ng/mL — AB (ref <0.03)

## 2017-11-24 LAB — BRAIN NATRIURETIC PEPTIDE: B NATRIURETIC PEPTIDE 5: 999.1 pg/mL — AB (ref 0.0–100.0)

## 2017-11-24 LAB — VITAMIN B12: VITAMIN B 12: 655 pg/mL (ref 180–914)

## 2017-11-24 MED ORDER — PANTOPRAZOLE SODIUM 40 MG IV SOLR
40.0000 mg | INTRAVENOUS | Status: DC
  Administered 2017-11-25 – 2017-11-29 (×5): 40 mg via INTRAVENOUS
  Filled 2017-11-24 (×5): qty 40

## 2017-11-24 MED ORDER — PIPERACILLIN-TAZOBACTAM IN DEX 2-0.25 GM/50ML IV SOLN
2.2500 g | Freq: Three times a day (TID) | INTRAVENOUS | Status: DC
  Filled 2017-11-24 (×2): qty 50

## 2017-11-24 MED ORDER — FENTANYL CITRATE (PF) 100 MCG/2ML IJ SOLN
INTRAMUSCULAR | Status: AC
  Administered 2017-11-24: 100 ug via INTRAVENOUS
  Filled 2017-11-24: qty 2

## 2017-11-24 MED ORDER — FENTANYL CITRATE (PF) 100 MCG/2ML IJ SOLN: 50.0000 ug | INTRAMUSCULAR | Status: DC | PRN

## 2017-11-24 MED ORDER — SODIUM CHLORIDE 0.9 % IV SOLN
3.0000 g | Freq: Two times a day (BID) | INTRAVENOUS | Status: DC
  Administered 2017-11-25 – 2017-11-28 (×8): 3 g via INTRAVENOUS
  Filled 2017-11-24 (×9): qty 3

## 2017-11-24 MED ORDER — MORPHINE SULFATE (PF) 2 MG/ML IV SOLN
0.5000 mg | Freq: Once | INTRAVENOUS | Status: AC
  Administered 2017-11-24: 0.5 mg via INTRAVENOUS
  Filled 2017-11-24: qty 1

## 2017-11-24 MED ORDER — FENTANYL CITRATE (PF) 100 MCG/2ML IJ SOLN
100.0000 ug | Freq: Once | INTRAMUSCULAR | Status: AC
Start: 2017-11-24 — End: 2017-11-24
  Administered 2017-11-24: 100 ug via INTRAVENOUS

## 2017-11-24 MED ORDER — METHYLPREDNISOLONE SODIUM SUCC 125 MG IJ SOLR
125.0000 mg | Freq: Once | INTRAMUSCULAR | Status: AC
  Administered 2017-11-24: 125 mg via INTRAVENOUS

## 2017-11-24 MED ORDER — MIDAZOLAM HCL 2 MG/2ML IJ SOLN
INTRAMUSCULAR | Status: AC
  Administered 2017-11-24: 2 mg
  Filled 2017-11-24: qty 2

## 2017-11-24 MED ORDER — ETOMIDATE 2 MG/ML IV SOLN
0.3000 mg/kg | Freq: Once | INTRAVENOUS | Status: AC
  Administered 2017-11-24: 28.84 mg via INTRAVENOUS
  Filled 2017-11-24: qty 14.42

## 2017-11-24 MED ORDER — DEXMEDETOMIDINE HCL 200 MCG/2ML IV SOLN: 0.4000 ug/kg/h | INTRAVENOUS | Status: DC

## 2017-11-24 MED ORDER — FENTANYL CITRATE (PF) 100 MCG/2ML IJ SOLN
100.0000 ug | Freq: Once | INTRAMUSCULAR | Status: AC
  Administered 2017-11-24: 100 ug via INTRAVENOUS

## 2017-11-24 MED ORDER — LEVOFLOXACIN IN D5W 500 MG/100ML IV SOLN
500.0000 mg | INTRAVENOUS | Status: DC
  Filled 2017-11-24: qty 100

## 2017-11-24 MED ORDER — SODIUM CHLORIDE 0.9 % IV SOLN
0.4000 ug/kg/h | INTRAVENOUS | Status: DC
  Filled 2017-11-24: qty 2

## 2017-11-24 MED ORDER — LABETALOL HCL 5 MG/ML IV SOLN
INTRAVENOUS | Status: AC
  Administered 2017-11-24: 10 mg via INTRAVENOUS
  Filled 2017-11-24: qty 4

## 2017-11-24 MED ORDER — DEXMEDETOMIDINE BOLUS VIA INFUSION
1.0000 ug/kg | Freq: Once | INTRAVENOUS | Status: DC
  Filled 2017-11-24: qty 97

## 2017-11-24 MED ORDER — SODIUM CHLORIDE 0.9 % IV SOLN: 250.0000 mL | INTRAVENOUS | Status: DC | PRN

## 2017-11-24 MED ORDER — ORAL CARE MOUTH RINSE
15.0000 mL | Freq: Two times a day (BID) | OROMUCOSAL | Status: DC
  Administered 2017-11-24: 15 mL via OROMUCOSAL

## 2017-11-24 MED ORDER — FUROSEMIDE 10 MG/ML IJ SOLN
40.0000 mg | Freq: Once | INTRAMUSCULAR | Status: AC
  Administered 2017-11-24: 40 mg via INTRAVENOUS
  Filled 2017-11-24: qty 4

## 2017-11-24 MED ORDER — ASPIRIN 81 MG PO CHEW
81.0000 mg | CHEWABLE_TABLET | Freq: Every day | ORAL | Status: DC
  Administered 2017-11-25: 81 mg via ORAL
  Filled 2017-11-24: qty 1

## 2017-11-24 MED ORDER — DEXMEDETOMIDINE HCL IN NACL 400 MCG/100ML IV SOLN
0.4000 ug/kg/h | INTRAVENOUS | Status: DC
  Administered 2017-11-24: 1.2 ug/kg/h via INTRAVENOUS
  Administered 2017-11-25: 0.4 ug/kg/h via INTRAVENOUS
  Administered 2017-11-25: 1.2 ug/kg/h via INTRAVENOUS
  Administered 2017-11-25: 0.6 ug/kg/h via INTRAVENOUS
  Administered 2017-11-25: 1.2 ug/kg/h via INTRAVENOUS
  Administered 2017-11-26: 0.4 ug/kg/h via INTRAVENOUS
  Filled 2017-11-24 (×7): qty 100

## 2017-11-24 MED ORDER — LABETALOL HCL 5 MG/ML IV SOLN
10.0000 mg | Freq: Once | INTRAVENOUS | Status: AC
  Administered 2017-11-24: 10 mg via INTRAVENOUS

## 2017-11-24 MED ORDER — METHYLPREDNISOLONE SODIUM SUCC 125 MG IJ SOLR
INTRAMUSCULAR | Status: AC
  Administered 2017-11-24: 125 mg via INTRAVENOUS
  Filled 2017-11-24: qty 2

## 2017-11-24 NOTE — Progress Notes (Signed)
Called by RN for sudden respiratory distress and sats dropping.  Patietn was placed on BIPAP and lasix given with no improvement in respiratory status.  Patient is still very SOB and demonstrating increased WOB.  MD paged and notified of worsening status, ABG results and need for higher level of care.  Assisted with obtaining ICU bed, and assisted with transfer to ICU.  Dr. Rennis Harding at bedside present during transport

## 2017-11-24 NOTE — Procedures (Signed)
Indication: Acute hypoxemic respiratory failure  Procedure: Sedation of fentanyl 100 mcg and etomidate 30 mg IVP. The vocal cords were adequately visualized with the GlideScope 4 and the patient intubated with a 7.5 cm ETT. Tracheal intubation confirmed with CO2 monitor; initial post-procedure CXR showed tip of tube in a high position. Will advance 2-3 cm and repeat film.

## 2017-11-24 NOTE — Progress Notes (Deleted)
Pharmacy Antibiotic Note  David [name] is a 76 y.o. male admitted on 11/22/2017 with SOB, cough and hypoxia.  Patient on unasyn and linezolid but now transitioning to linezolid + zosyn + levaquin. Pt is afebrile and WBC is elevated at 13.9. Scr is elevated at 4.61.   Plan: Zosyn 2.25gm IV Q8H Levaquin 500mg  IV Q48H F/u renal fxn, C&S, clinical status   Height: 6' (182.9 cm) Weight: 211 lb 13.8 oz (96.1 kg) IBW/kg (Calculated) : 77.6  Temp (24hrs), Avg:98.1 F (36.7 C), Min:97.6 F (36.4 C), Max:99 F (37.2 C)  Recent Labs  Lab 11/22/17 2224 11/22/17 2230 11/23/17 0327 11/24/17 0335  WBC 17.1*  --  11.7* 13.9*  CREATININE 4.82* 4.90* 4.71* 4.61*  LATICACIDVEN  --  1.05  --   --     Estimated Creatinine Clearance: 16.6 mL/min (A) (by C-G formula based on SCr of 4.61 mg/dL (H)).    Allergies  Allergen Reactions  . Codeine Other (See Comments)    Causes constipation  . Cozaar [Losartan Potassium] Other (See Comments)    Causes constipation      Salome Arnt, PharmD, BCPS 11/24/2017 7:58 PM

## 2017-11-24 NOTE — Progress Notes (Addendum)
[facility]                                                                              Patient Demographics  David [name], is a 76 y.o. male, DOB - July 10, 1941, AFB:[id number]  Admit date - 11/22/2017   Admitting Physician Vianne Bulls, MD  Outpatient Primary MD for the patient is [name], NP  Outpatient specialists:   LOS - 1  days   Medical records reviewed and are as summarized below:    Chief Complaint  Patient presents with  . 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  Patient reported he had been developing dyspnea with minimal exertion over the past 2 days.  EMS found him with hypoxia, O2 sats in 70s.  Chest x-ray showed extensive consolidation throughout the right middle lobe and lower lobes, left base, multifocal pneumonia.  WBC count 17.1, lactic acid 1.0, creatinine 4.8 up from 3.8 in 8/18.  Patient was initially placed on BiPAP.  Assessment & Plan    Principal Problem:   Sepsis due to multifocal pneumonia ([place]), acute hypoxic respiratory failure -Patient met sepsis criteria with leukocytosis, tachypnea, hypoxia, acute respiratory distress, multifocal pneumonia.   -Blood cultures negative so far, follow sputum cultures, influenza panel negative. -Given history of HIV/AIDS, immunocompromised, hypoxia, patient was placed on Bactrim, IV steroids, low probability of PCP pneumonia, discontinued.   -Vancomycin was discontinued.  ID following, recommendation to stop the Zithromax and DC Zyvox when his blood cultures are negative. -Off BiPAP however still on 5 L O2 via nasal cannula, wean as tolerated.   -If no significant improvement in hypoxia, may need BAL for  PCP ADDENDUM:6:30pm   Inc work of breathing with hypoxia - ordered stat CXR-> persistent infiltrates, BNP, Bipap, lasix 28m iV x1, solumedrol 1250mx1, morphine 0.68m10m1.   ABG stat-> 7.2/56/89, d/w Dr CriRennis HardingCM) will evaluate patient.  Patient will need BAL to rule out PCP pneumonia in the setting of HIV.   Active Problems: Acute on CKD (chronic kidney disease) stage 4, GFR 15-29 ml/min (HCC) -Creatinine 4.82 on admission, up from 3.8 in 8/18 -Received IV Lasix in ED, currently held, discontinued vancomycin and Bactrim.  Creatinine improving today 4.6.      Essential hypertension -BP soft, hold Norvasc, continue Coreg     Chronic combined systolic and diastolic heart failure (HCC) - chest x-ray showed extensive consolidation, no significant pulmonary edema -2D echo 9/18 showed EF of 25-30%, diffuse hypokinesis, grade 1 diastolic dysfunction.  No CAD on cath. -Continue Coreg, hold Lasix until creatinine improves.  Monitor I's and O's closely    Macrocytic anemia -Obtain B12, folate  HIV/AIDS (HCC) -Last viral load undetectable, ID following -Continue Tivicay, Descovy, adjust doses with renal function   Code Status: Full code DVT Prophylaxis: Subcu heparin Family Communication: Discussed in detail with the patient, all imaging results, lab results explained to  the patient   Disposition Plan: Continue monitoring in stepdown unit today  Time Spent in minutes   25 minutes  Procedures:    Consultants:   Infectious disease  Antimicrobials:   IV vancomycin 12/21-> 12/21  IV Zyvox 12/21>>  IV Zithromax 12/21>  IV Rocephin 12/21>>   Medications  Scheduled Meds: . amLODipine  10 mg Oral Daily  . aspirin  325 mg Oral QPM  . brimonidine  1 drop Left Eye BID  . carvedilol  25 mg Oral BID WC  . dolutegravir  50 mg Oral Daily  . dorzolamide-timolol  1 drop Left Eye BID  . emtricitabine-tenofovir AF  1 tablet Oral Daily  . famotidine  20 mg Oral Daily  . heparin   5,000 Units Subcutaneous Q8H  . latanoprost  1 drop Left Eye QHS  . mouth rinse  15 mL Mouth Rinse BID  . multivitamin  1 tablet Oral QHS  . sodium chloride flush  3 mL Intravenous Q12H   Continuous Infusions: . sodium chloride    . ampicillin-sulbactam (UNASYN) IV Stopped (11/24/17 0654)  . linezolid (ZYVOX) IV Stopped (11/24/17 0107)   PRN Meds:.sodium chloride, hydrALAZINE, oxyCODONE-acetaminophen, sodium chloride flush   Antibiotics   Anti-infectives (From admission, onward)   Start     Dose/Rate Route Frequency Ordered Stop   11/24/17 2200  vancomycin (VANCOCIN) IVPB 1000 mg/200 mL premix  Status:  Discontinued     1,000 mg 200 mL/hr over 60 Minutes Intravenous Every 48 hours 11/23/17 0143 11/23/17 1040   11/23/17 2200  cefTRIAXone (ROCEPHIN) 1 g in dextrose 5 % 50 mL IVPB  Status:  Discontinued     1 g 100 mL/hr over 30 Minutes Intravenous Every 24 hours 11/23/17 0004 11/23/17 1556   11/23/17 2200  azithromycin (ZITHROMAX) 500 mg in dextrose 5 % 250 mL IVPB  Status:  Discontinued     500 mg 250 mL/hr over 60 Minutes Intravenous Every 24 hours 11/23/17 0004 11/24/17 1058   11/23/17 1700  Ampicillin-Sulbactam (UNASYN) 3 g in sodium chloride 0.9 % 100 mL IVPB     3 g 200 mL/hr over 30 Minutes Intravenous Every 12 hours 11/23/17 1613     11/23/17 1200  dolutegravir (TIVICAY) tablet 50 mg     50 mg Oral Daily 11/23/17 0004     11/23/17 1200  emtricitabine-tenofovir AF (DESCOVY) 200-25 MG per tablet 1 tablet     1 tablet Oral Daily 11/23/17 0004     11/23/17 1045  linezolid (ZYVOX) IVPB 600 mg    Comments:  Please adjust dose according to renal function   600 mg 300 mL/hr over 60 Minutes Intravenous Every 12 hours 11/23/17 1042     11/23/17 1000  bictegravir-emtricitabine-tenofovir AF ([name]) 50-200-25 MG per tablet 1 tablet  Status:  Discontinued     1 tablet Oral Daily 11/23/17 0004 11/23/17 0439   11/23/17 0100  sulfamethoxazole-trimethoprim (BACTRIM DS,SEPTRA DS)  800-160 MG per tablet 1 tablet  Status:  Discontinued     1 tablet Oral Every 12 hours 11/23/17 0004 11/23/17 1041   11/23/17 0045  vancomycin (VANCOCIN) 1,500 mg in sodium chloride 0.9 % 500 mL IVPB     1,500 mg 250 mL/hr over 120 Minutes Intravenous  Once 11/23/17 0037 11/23/17 0328   11/22/17 2245  cefTRIAXone (ROCEPHIN) 1 g in dextrose 5 % 50 mL IVPB     1 g 100 mL/hr over 30 Minutes Intravenous  Once 11/22/17 2231 11/23/17 0041   11/22/17 2245  azithromycin (ZITHROMAX) 500 mg in dextrose 5 % 250 mL IVPB     500 mg 250 mL/hr over 60 Minutes Intravenous  Once 11/22/17 2231 11/23/17 0142        Subjective:   Alger Kerstein was seen and examined today.  Shortness of breath is slightly improving, afebrile this morning. Patient denies dizziness, chest pain, abdominal pain, N/V/D/C, new weakness, numbess, tingling.   Objective:   Vitals:   11/23/17 2010 11/23/17 2340 11/24/17 0501 11/24/17 0748  BP: 139/83 117/79 (!) 99/53 122/83  Pulse: 78 87 67 67  Resp: (!) 21 (!) 24 19 (!) 24  Temp: 97.9 F (36.6 C) 97.9 F (36.6 C) 98.7 F (37.1 C) 97.7 F (36.5 C)  TempSrc: Oral Oral Oral Oral  SpO2: 100% 97% 98% 98%  Weight:      Height:        Intake/Output Summary (Last 24 hours) at 11/24/2017 1116 Last data filed at 11/24/2017 0749 Gross per 24 hour  Intake 1308 ml  Output 1225 ml  Net 83 ml     Wt Readings from Last 3 Encounters:  11/23/17 96.1 kg (211 lb 13.8 oz)  10/05/17 96.5 kg (212 lb 12.8 oz)  10/03/17 95.7 kg (211 lb)     Exam   General: Alert and oriented x 3, NAD  Eyes:   HEENT:  Atraumatic, normocephalic  Cardiovascular: S1 S2 auscultated, no rubs, murmurs or gallops. Regular rate and rhythm. No pedal edema b/l  Respiratory: Bilateral rhonchi  Gastrointestinal: Soft, nontender, nondistended, + bowel sounds  Ext: no pedal edema bilaterally  Neuro: no new deficits  Musculoskeletal: No digital cyanosis, clubbing  Skin: No rashes  Psych:  Normal affect and demeanor, alert and oriented x3    Data Reviewed:  I have personally reviewed following labs and imaging studies  Micro Results Recent Results (from the past 240 hour(s))  MRSA PCR Screening     Status: None   Collection Time: 11/23/17  6:41 AM  Result Value Ref Range Status   MRSA by PCR NEGATIVE NEGATIVE Final    Comment:        The GeneXpert MRSA Assay (FDA approved for NASAL specimens only), is one component of a comprehensive MRSA colonization surveillance program. It is not intended to diagnose MRSA infection nor to guide or monitor treatment for MRSA infections.     Radiology Reports Dg Chest 2 View  Result Date: 11/23/2017 CLINICAL DATA:  Shortness of breath cough and hypoxia EXAM: CHEST  2 VIEW COMPARISON:  11/22/2017, 01/25/2010 FINDINGS: Improved aeration bilaterally. No pleural effusion. Mild residual pulmonary infiltrates in the right upper lobe and bilateral lung bases. Stable cardiomegaly with mild central vascular congestion. No pneumothorax. Degenerative changes of the spine. IMPRESSION: 1. Decreased right upper and bilateral lower lobe pulmonary infiltrates or edema. 2. Cardiomegaly with vascular congestion. Electronically Signed   By: Donavan Foil M.D.   On: 11/23/2017 22:40   Mr Brain Wo Contrast  Result Date: 11/01/2017 CLINICAL DATA:  Syncope and collapse. Personal history prostate cancer EXAM: MRI HEAD WITHOUT CONTRAST TECHNIQUE: Multiplanar, multiecho pulse sequences of the brain and surrounding structures were obtained without intravenous contrast. COMPARISON:  CT head 07/31/2017 FINDINGS: Brain: Moderate atrophy.  Ventricular enlargement is stable. Negative for acute infarct. Moderate chronic ischemic changes are present throughout the white matter . Chronic lacunar infarct left pons. Slight chronic ischemic changes cerebellum. Negative for hemorrhage or mass. No fluid collection or shift of the midline structures. Vascular: Normal  arterial flow voids.  Skull and upper cervical spine: Negative Sinuses/Orbits: Mild mucosal edema paranasal sinuses. Bilateral cataract removal. Other: None IMPRESSION: Atrophy and moderate chronic microvascular ischemic change. No acute abnormality. Electronically Signed   By: Franchot Gallo M.D.   On: 11/01/2017 10:27   Mr Cervical Spine Wo Contrast  Result Date: 11/01/2017 CLINICAL DATA:  Hyper reflexia.  Personal history prostate cancer EXAM: MRI CERVICAL SPINE WITHOUT CONTRAST TECHNIQUE: Multiplanar, multisequence MR imaging of the cervical spine was performed. No intravenous contrast was administered. COMPARISON:  None. FINDINGS: Alignment: Image quality degraded by mild motion. Normal alignment Vertebrae: Negative for fracture or mass. Negative for metastatic disease. Cord: Normal signal and morphology Posterior Fossa, vertebral arteries, paraspinal tissues: Negative Disc levels: C2-3:  Negative C3-4: Shallow central disc protrusion and spurring causing mild cord flattening and mild spinal stenosis C4-5: Mild disc degeneration with mild disc bulging and spurring. Mild spinal stenosis C5-6: Left foraminal encroachment due to spurring. Mild spinal stenosis. C6-7: Mild disc degeneration and spurring without significant stenosis C7-T1: Mild disc degeneration and spurring without significant stenosis. 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 Left foraminal narrowing C5-6 due to spurring Negative for fracture or metastatic disease.  No cord compression. Electronically Signed   By: Franchot Gallo M.D.   On: 11/01/2017 10:30   Dg Chest Portable 1 View  Result Date: 11/22/2017 CLINICAL DATA:  Respiratory distress with shortness of breath EXAM: PORTABLE CHEST 1 VIEW COMPARISON:  Chest radiograph January 25, 2010 and chest CT Apr 29, 2014 FINDINGS: There is airspace consolidation throughout the right mid lower lung zones. There is also consolidation in the  medial left base. There is cardiomegaly with pulmonary vascularity within normal limits. There is a small right pleural effusion. No evident adenopathy. No bone lesions. IMPRESSION: Extensive consolidation throughout much of the right mid and lower lung zones felt to represent multifocal pneumonia. Consolidation medial left base also felt to represent pneumonia. Small right pleural effusion. Cardiomegaly with pulmonary vascularity within normal limits. No adenopathy evident. Electronically Signed   By: Lowella Grip III M.D.   On: 11/22/2017 22:37    Lab Data:  CBC: Recent Labs  Lab 11/22/17 2224 11/22/17 2230 11/23/17 0327 11/24/17 0335  WBC 17.1*  --  11.7* 13.9*  NEUTROABS 14.5*  --  9.4*  --   HGB 12.5* 14.6 11.7* 10.8*  HCT 39.6 43.0 36.1* 32.8*  MCV 102.9*  --  100.8* 100.3*  PLT 206  --  238 923   Basic Metabolic Panel: Recent Labs  Lab 11/22/17 2224 11/22/17 2230 11/23/17 0327 11/24/17 0335  NA 137 142 139 138  K 4.4 4.3 4.1 4.5  CL 106 110 108 107  CO2 19*  --  22 22  GLUCOSE 219* 225* 122* 139*  BUN 66* 58* 63* 66*  CREATININE 4.82* 4.90* 4.71* 4.61*  CALCIUM 8.5*  --  8.4* 8.5*   GFR: Estimated Creatinine Clearance: 16.6 mL/min (A) (by C-G formula based on SCr of 4.61 mg/dL (H)). Liver Function Tests: Recent Labs  Lab 11/22/17 2224  AST 22  ALT 20  ALKPHOS 104  BILITOT 0.6  PROT 7.5  ALBUMIN 3.7   No results for input(s): LIPASE, AMYLASE in the last 168 hours. No results for input(s): AMMONIA in the last 168 hours. Coagulation Profile: No results for input(s): INR, PROTIME in the last 168 hours. Cardiac Enzymes: No results for input(s): CKTOTAL, CKMB, CKMBINDEX, TROPONINI in the last 168 hours. BNP (last 3 results) No results for  input(s): PROBNP in the last 8760 hours. HbA1C: No results for input(s): HGBA1C in the last 72 hours. CBG: Recent Labs  Lab 11/22/17 2223 11/23/17 1650  GLUCAP 198* 221*   Lipid Profile: No results for  input(s): CHOL, HDL, LDLCALC, TRIG, CHOLHDL, LDLDIRECT in the last 72 hours. Thyroid Function Tests: No results for input(s): TSH, T4TOTAL, FREET4, T3FREE, THYROIDAB in the last 72 hours. Anemia Panel: No results for input(s): VITAMINB12, FOLATE, FERRITIN, TIBC, IRON, RETICCTPCT in the last 72 hours. Urine analysis:    Component Value Date/Time   COLORURINE YELLOW 05/11/2014 [place] 05/11/2014 1352   LABSPEC 1.010 05/11/2014 1352   PHURINE 5.0 05/11/2014 1352   GLUCOSEU NEGATIVE 05/11/2014 1352   HGBUR TRACE (A) 05/11/2014 1352   BILIRUBINUR NEGATIVE 05/11/2014 1352   BILIRUBINUR negative 04/16/2013 1447   KETONESUR NEGATIVE 05/11/2014 1352   PROTEINUR 100 (A) 05/11/2014 1352   UROBILINOGEN 0.2 05/11/2014 1352   NITRITE NEGATIVE 05/11/2014 1352   LEUKOCYTESUR NEGATIVE 05/11/2014 1352     Ripudeep Rai M.D. [facility] 11/24/2017, 11:16 AM  Pager: [phone] Between 7am to 7pm - call Pager - [phone]  After 7pm go to [email or web address] - password [email or web address]  Call night coverage person covering after 7pm

## 2017-11-24 NOTE — Progress Notes (Signed)
INFECTIOUS DISEASE PROGRESS NOTE  ID: David [name] is a 76 y.o. male with  Principal Problem:   Sepsis due to pneumonia [facility]) Active Problems:   Essential hypertension   Chronic combined systolic and diastolic heart failure (HCC)   Macrocytic anemia   CKD (chronic kidney disease) stage 4, GFR 15-29 ml/min (HCC)   AIDS (HCC)   Acute renal failure superimposed on stage 4 chronic kidney disease ([place])   Community acquired pneumonia of right lung   Aspiration pneumonia of both lower lobes due to regurgitated food ([place])  Subjective: No complaints.   Abtx:  Anti-infectives (From admission, onward)   Start     Dose/Rate Route Frequency Ordered Stop   11/24/17 2200  vancomycin (VANCOCIN) IVPB 1000 mg/200 mL premix  Status:  Discontinued     1,000 mg 200 mL/hr over 60 Minutes Intravenous Every 48 hours 11/23/17 0143 11/23/17 1040   11/23/17 2200  cefTRIAXone (ROCEPHIN) 1 g in dextrose 5 % 50 mL IVPB  Status:  Discontinued     1 g 100 mL/hr over 30 Minutes Intravenous Every 24 hours 11/23/17 0004 11/23/17 1556   11/23/17 2200  azithromycin (ZITHROMAX) 500 mg in dextrose 5 % 250 mL IVPB     500 mg 250 mL/hr over 60 Minutes Intravenous Every 24 hours 11/23/17 0004 11/29/17 2159   11/23/17 1700  Ampicillin-Sulbactam (UNASYN) 3 g in sodium chloride 0.9 % 100 mL IVPB     3 g 200 mL/hr over 30 Minutes Intravenous Every 12 hours 11/23/17 1613     11/23/17 1200  dolutegravir (TIVICAY) tablet 50 mg     50 mg Oral Daily 11/23/17 0004     11/23/17 1200  emtricitabine-tenofovir AF (DESCOVY) 200-25 MG per tablet 1 tablet     1 tablet Oral Daily 11/23/17 0004     11/23/17 1045  linezolid (ZYVOX) IVPB 600 mg    Comments:  Please adjust dose according to renal function   600 mg 300 mL/hr over 60 Minutes Intravenous Every 12 hours 11/23/17 1042     11/23/17 1000  bictegravir-emtricitabine-tenofovir AF ([name]) 50-200-25 MG per tablet 1 tablet  Status:  Discontinued     1 tablet Oral Daily  11/23/17 0004 11/23/17 0439   11/23/17 0100  sulfamethoxazole-trimethoprim (BACTRIM DS,SEPTRA DS) 800-160 MG per tablet 1 tablet  Status:  Discontinued     1 tablet Oral Every 12 hours 11/23/17 0004 11/23/17 1041   11/23/17 0045  vancomycin (VANCOCIN) 1,500 mg in sodium chloride 0.9 % 500 mL IVPB     1,500 mg 250 mL/hr over 120 Minutes Intravenous  Once 11/23/17 0037 11/23/17 0328   11/22/17 2245  cefTRIAXone (ROCEPHIN) 1 g in dextrose 5 % 50 mL IVPB     1 g 100 mL/hr over 30 Minutes Intravenous  Once 11/22/17 2231 11/23/17 0041   11/22/17 2245  azithromycin (ZITHROMAX) 500 mg in dextrose 5 % 250 mL IVPB     500 mg 250 mL/hr over 60 Minutes Intravenous  Once 11/22/17 2231 11/23/17 0142      Medications:  Scheduled: . amLODipine  10 mg Oral Daily  . aspirin  325 mg Oral QPM  . brimonidine  1 drop Left Eye BID  . carvedilol  25 mg Oral BID WC  . dolutegravir  50 mg Oral Daily  . dorzolamide-timolol  1 drop Left Eye BID  . emtricitabine-tenofovir AF  1 tablet Oral Daily  . famotidine  20 mg Oral Daily  . heparin  5,000 Units  Subcutaneous Q8H  . latanoprost  1 drop Left Eye QHS  . mouth rinse  15 mL Mouth Rinse BID  . multivitamin  1 tablet Oral QHS  . sodium chloride flush  3 mL Intravenous Q12H    Objective: Vital signs in last 24 hours: Temp:  [97.7 F (36.5 C)-98.9 F (37.2 C)] 97.7 F (36.5 C) (12/22 0748) Pulse Rate:  [67-145] 67 (12/22 0748) Resp:  [19-28] 24 (12/22 0748) BP: (99-139)/(53-84) 122/83 (12/22 0748) SpO2:  [97 %-100 %] 98 % (12/22 0748)   General appearance: alert, cooperative and no distress Resp: rhonchi anterior - right Cardio: regular rate and rhythm GI: normal findings: bowel sounds normal and soft, non-tender Extremities: edema none  Lab Results Recent Labs    11/23/17 0327 11/24/17 0335  WBC 11.7* 13.9*  HGB 11.7* 10.8*  HCT 36.1* 32.8*  NA 139 138  K 4.1 4.5  CL 108 107  CO2 22 22  BUN 63* 66*  CREATININE 4.71* 4.61*   Liver  Panel Recent Labs    11/22/17 2224  PROT 7.5  ALBUMIN 3.7  AST 22  ALT 20  ALKPHOS 104  BILITOT 0.6   Sedimentation Rate No results for input(s): ESRSEDRATE in the last 72 hours. C-Reactive Protein No results for input(s): CRP in the last 72 hours.  Microbiology: Recent Results (from the past 240 hour(s))  MRSA PCR Screening     Status: None   Collection Time: 11/23/17  6:41 AM  Result Value Ref Range Status   MRSA by PCR NEGATIVE NEGATIVE Final    Comment:        The GeneXpert MRSA Assay (FDA approved for NASAL specimens only), is one component of a comprehensive MRSA colonization surveillance program. It is not intended to diagnose MRSA infection nor to guide or monitor treatment for MRSA infections.     Studies/Results: Dg Chest 2 View  Result Date: 11/23/2017 CLINICAL DATA:  Shortness of breath cough and hypoxia EXAM: CHEST  2 VIEW COMPARISON:  11/22/2017, 01/25/2010 FINDINGS: Improved aeration bilaterally. No pleural effusion. Mild residual pulmonary infiltrates in the right upper lobe and bilateral lung bases. Stable cardiomegaly with mild central vascular congestion. No pneumothorax. Degenerative changes of the spine. IMPRESSION: 1. Decreased right upper and bilateral lower lobe pulmonary infiltrates or edema. 2. Cardiomegaly with vascular congestion. Electronically Signed   By: Donavan Foil M.D.   On: 11/23/2017 22:40   Dg Chest Portable 1 View  Result Date: 11/22/2017 CLINICAL DATA:  Respiratory distress with shortness of breath EXAM: PORTABLE CHEST 1 VIEW COMPARISON:  Chest radiograph January 25, 2010 and chest CT Apr 29, 2014 FINDINGS: There is airspace consolidation throughout the right mid lower lung zones. There is also consolidation in the medial left base. There is cardiomegaly with pulmonary vascularity within normal limits. There is a small right pleural effusion. No evident adenopathy. No bone lesions. IMPRESSION: Extensive consolidation throughout  much of the right mid and lower lung zones felt to represent multifocal pneumonia. Consolidation medial left base also felt to represent pneumonia. Small right pleural effusion. Cardiomegaly with pulmonary vascularity within normal limits. No adenopathy evident. Electronically Signed   By: Lowella Grip III M.D.   On: 11/22/2017 22:37     Assessment/Plan: RML and LLL pneumonia AIDS CKD  Total days of antibiotics: 1 azithro/unasyn/zyvox  Will stop azithro Would stop zyvox when his BCx are negative (sent 12-20) I took off his O2 and he rapidly went to SpO2 86% If he continues to be hypoxic,  would consider BAL for PCP Available 12-23    HIV 1 RNA Quant (copies/mL)  Date Value  09/19/2017 <20 NOT DETECTED  08/03/2017 56 (H)  05/22/2017 <20 DETECTED (A)   CD4 T Cell Abs (/uL)  Date Value  11/22/2017 170 (L)  09/19/2017 310 (L)  08/03/2017 350 (L)            Bobby Rumpf MD, FACP Infectious Diseases (pager) [phone] [email or web address] 11/24/2017, 10:49 AM  LOS: 1 day

## 2017-11-24 NOTE — Procedures (Signed)
Central Venous Catheter Insertion Procedure Note David [name] [id number] 1941/04/26  Procedure: Insertion of Central Venous Catheter Indications: Assessment of intravascular volume, Drug and/or fluid administration and Frequent blood sampling  Procedure Details Consent: Unable to obtain consent because of emergent medical necessity. Time Out: Verified patient identification, verified procedure, site/side was marked, verified correct patient position, special equipment/implants available, medications/allergies/relevent history reviewed, required imaging and test results available.  Performed  Maximum sterile technique was used including antiseptics, cap, gloves, gown, hand hygiene, mask and sheet. Skin prep: Chlorhexidine; local anesthetic administered A antimicrobial bonded/coated triple lumen catheter was placed in the right internal jugular vein using the Seldinger technique.  Evaluation Blood flow good Complications: No apparent complications Patient did tolerate procedure well. Chest X-ray ordered to verify placement.  CXR: pending.  Hayden Pedro, AGACNP-BC [facility]  Pgr: [phone]  PCCM Pgr: [phone]

## 2017-11-24 NOTE — Consult Note (Signed)
PULMONARY / CRITICAL CARE MEDICINE   Name: David [name] MRN: [id number] DOB: [birth year]    ADMISSION DATE:  11/22/2017 CONSULTATION DATE:  11/24/2017  REFERRING MD:  Dr. Tana Coast  CHIEF COMPLAINT:  Dyspnea   HISTORY OF PRESENT ILLNESS:  Information obtained via medical record as patient is patient is intubated and sedated   76 year old male with PMH of Anemia, CKD stage IV, HIV (CD4 310 and VL undetectable in October), HLD, HTN, Chronic mixed systolic/diastolic HF (EF 67-67, M0NO with severely reduced systolic dysfunction), prostate CA s/p brachytherapy    Presents to ED on 12/21 with progressive dyspnea with productive cough. CXR with extensive consolidation of right middle and lower lobes as well as left base, concerning for multifocal PNA. Given Azithromycin and Rocephin, placed on BiPAP and admitted to step-down.   On 12/22 patient became progressively hypoxic with increased work of breathing. CXR with persistent infiltrates. Given Lasix 40 x 1, Solu-medrol 125 mg x 1, morphine 0.5 mg x 1. ABG 7.2/56/89. Intubated and transferred to ICU.    PAST MEDICAL HISTORY :  He  has a past medical history of Anemia, iron deficiency (11/15/2011), Arthritis, Cancer ([name]), Chronic kidney disease, Glaucoma, Heart murmur, History of cardiac cath (2004), History of myocardial perfusion scan (02/2010), HIV infection ([name]), Hyperlipidemia, Hypertension, Nonischemic cardiomyopathy ([name]), Sinus bradycardia, Stroke ([name]), and Systolic and diastolic CHF, acute ([name]) (06/2010).  PAST SURGICAL HISTORY: He  has a past surgical history that includes Radioactive seed implant (2010); Cardiac catheterization (02/04/2003); US ECHOCARDIOGRAPHY (02/06/2012); NM MYOCAR PERF WALL MOTION (02/21/2010); Esophagogastroduodenoscopy (egd) with propofol (N/A, 05/05/2014); Eye surgery (Bilateral); Eye surgery (Bilateral); Colonoscopy; Bascilic vein transposition (Left, 06/24/2015); and AV fistula placement (Right, 10/13/2016).  Allergies   Allergen Reactions  . Codeine Other (See Comments)    Causes constipation  . Cozaar [Losartan Potassium] Other (See Comments)    Causes constipation     No current facility-administered medications on file prior to encounter.    Current Outpatient Medications on File Prior to Encounter  Medication Sig  . acetaminophen (TYLENOL) 500 MG tablet Take 500 mg by mouth every 4 (four) hours as needed (for muscle pain).   Marland Kitchen amLODipine (NORVASC) 10 MG tablet TAKE 1 TABLET EVERY DAY (Patient taking differently: Take 10 mg by mouth once a day)  . aspirin 325 MG EC tablet Take 325 mg by mouth every evening.   . brimonidine (ALPHAGAN) 0.15 % ophthalmic solution Place 1 drop into the left eye 2 (two) times daily.   . dolutegravir (TIVICAY) 50 MG tablet Take 1 tablet (50 mg total) by mouth daily.  . dorzolamide-timolol (COSOPT) 22.3-6.8 MG/ML ophthalmic solution Place 1 drop into the left eye 2 (two) times daily.   Marland Kitchen emtricitabine-tenofovir AF (DESCOVY) 200-25 MG tablet Take 1 tablet by mouth daily.  . furosemide (LASIX) 40 MG tablet Take 2 tablets (80 mg total) by mouth daily. (Patient taking differently: Take 80 mg by mouth every morning. )  . latanoprost (XALATAN) 0.005 % ophthalmic solution Place 1 drop into the left eye at bedtime.   . ranitidine (ZANTAC) 150 MG tablet Take 1 tablet (150 mg total) by mouth 2 (two) times daily.  . Vitamin D, Ergocalciferol, (DRISDOL) 50000 UNITS CAPS capsule Take 50,000 units by mouth once a week on Saturdays  . carvedilol (COREG) 25 MG tablet TAKE 1 TABLET TWICE DAILY (Patient taking differently: Take 25 mg by mouth two times a day)    FAMILY HISTORY:  His indicated that his mother is deceased.  He indicated that his father is deceased. He indicated that the status of his maternal grandmother is unknown. He indicated that his daughter is alive. He indicated that his son is alive. He indicated that the status of his neg hx is unknown.   SOCIAL HISTORY: He   reports that he quit smoking about 11 years ago. he has never used smokeless tobacco. He reports that he drinks alcohol. He reports that he does not use drugs.  REVIEW OF SYSTEMS:   Unable to review as patient is intubated and sedated   SUBJECTIVE:   VITAL SIGNS: BP (!) 138/125   Pulse 70   Temp 99 F (37.2 C) (Oral)   Resp 15   Ht 6' (1.829 m)   Wt 96.1 kg (211 lb 13.8 oz)   SpO2 99%   BMI 28.73 kg/m   HEMODYNAMICS:    VENTILATOR SETTINGS: Vent Mode: PRVC FiO2 (%):  [100 %] 100 % Set Rate:  [14 bmp] 14 bmp Vt Set:  [620 mL] 620 mL PEEP:  [5 cmH20] 5 cmH20  INTAKE / OUTPUT: I/O last 3 completed shifts: In: 2734 [P.O.:1678; I.V.:6; IV Piggyback:1050] Out: 2000 [Urine:2000]  PHYSICAL EXAMINATION: General:  Adult male, on vent, no distress  Neuro:  Sedated, moves extremities, pupils intact, does not follow commands   HEENT:  ETT in place  Cardiovascular:  RRR, no MRG  Lungs:  Rhonchi, diminished breath sounds to base  Abdomen:  Non-distended, active bowel sounds  Musculoskeletal:  -edema  Skin:  Warm, dry, intact   LABS:  BMET Recent Labs  Lab 11/22/17 2224 11/22/17 2230 11/23/17 0327 11/24/17 0335  NA 137 142 139 138  K 4.4 4.3 4.1 4.5  CL 106 110 108 107  CO2 19*  --  22 22  BUN 66* 58* 63* 66*  CREATININE 4.82* 4.90* 4.71* 4.61*  GLUCOSE 219* 225* 122* 139*    Electrolytes Recent Labs  Lab 11/22/17 2224 11/23/17 0327 11/24/17 0335  CALCIUM 8.5* 8.4* 8.5*    CBC Recent Labs  Lab 11/22/17 2224 11/22/17 2230 11/23/17 0327 11/24/17 0335  WBC 17.1*  --  11.7* 13.9*  HGB 12.5* 14.6 11.7* 10.8*  HCT 39.6 43.0 36.1* 32.8*  PLT 206  --  238 210    Coag's No results for input(s): APTT, INR in the last 168 hours.  Sepsis Markers Recent Labs  Lab 11/22/17 2230  LATICACIDVEN 1.05    ABG Recent Labs  Lab 11/22/17 2247 11/24/17 1816  PHART 7.300* 7.225*  PCO2ART 41.6 56.7*  PO2ART 87.0 69.4*    Liver Enzymes Recent Labs  Lab  11/22/17 2224  AST 22  ALT 20  ALKPHOS 104  BILITOT 0.6  ALBUMIN 3.7    Cardiac Enzymes No results for input(s): TROPONINI, PROBNP in the last 168 hours.  Glucose Recent Labs  Lab 11/22/17 2223 11/23/17 1650  GLUCAP 198* 221*    Imaging Dg Chest 1 View  Result Date: 11/24/2017 CLINICAL DATA:  Endotracheal tube placement. EXAM: CHEST 1 VIEW COMPARISON:  11/24/2017 FINDINGS: Interval placement of endotracheal tube with tip 6.3 cm above the carina. Interval placement of enteric tube which courses off the inferior portion of the film as non radiopaque port lies approximately 5 cm above the diaphragm. Lungs are adequately inflated demonstrate slight interval worsening of airspace opacification over the right lung and minimally in the left perihilar region. No definite effusion. Mild stable cardiomegaly. Remainder of the exam is unchanged. IMPRESSION: Possible slight interval worsening of multifocal airspace process  most involving the right lung likely multifocal pneumonia. Stable cardiomegaly. Tubes and lines as described. Electronically Signed   By: Marin Olp M.D.   On: 11/24/2017 19:38   Dg Chest 2 View  Result Date: 11/23/2017 CLINICAL DATA:  Shortness of breath cough and hypoxia EXAM: CHEST  2 VIEW COMPARISON:  11/22/2017, 01/25/2010 FINDINGS: Improved aeration bilaterally. No pleural effusion. Mild residual pulmonary infiltrates in the right upper lobe and bilateral lung bases. Stable cardiomegaly with mild central vascular congestion. No pneumothorax. Degenerative changes of the spine. IMPRESSION: 1. Decreased right upper and bilateral lower lobe pulmonary infiltrates or edema. 2. Cardiomegaly with vascular congestion. Electronically Signed   By: Donavan Foil M.D.   On: 11/23/2017 22:40   Dg Chest Port 1 View  Result Date: 11/24/2017 CLINICAL DATA:  76 year old male with progressive shortness of breath EXAM: PORTABLE CHEST 1 VIEW COMPARISON:  Recent chest x-ray obtained  yesterday 11/23/2017 FINDINGS: Stable marked cardiomegaly. Persistent patchy airspace opacity in the right upper and lower lobes. No new pulmonary edema, pleural effusion or pneumothorax. Overall, minimal change in the appearance of the lungs. No acute osseous abnormality. IMPRESSION: No significant interval change in the appearance of the lungs over the last 24 hours. Persistent patchy airspace opacity in the right upper and lower lobes concerning for multifocal pneumonia. Electronically Signed   By: Jacqulynn Cadet M.D.   On: 11/24/2017 17:58     STUDIES:  CXR 12/21 > Decreased right upper and bilateral lower lobe pulmonary infiltrates or edema. 2. Cardiomegaly with vascular congestion. CXR 12/22 > Possible slight interval worsening of multifocal airspace process most involving the right lung likely multifocal pneumonia  CULTURES: Blood 12/20 >> MRSA PCR 12/21 > Negative  Step Pneumo 12/21 > Negative  L. Pneumo 12/21 > Negative  Blood 12/22 >> Sputum 12/22 >>   ANTIBIOTICS: Unasyn 12/21 >> Azithromycin 12/20 > 12/21 Linezolid 12/21 >>  SIGNIFICANT EVENTS: 12/21 > Presents to ED   LINES/TUBES: ETT 12/22 >> RIJ CVC 12/22 >>  DISCUSSION: 76 year old male presents to ED with multilobar PNA. Admitted to step-down. On 12/22 required intubation.   ASSESSMENT / PLAN:  PULMONARY A: Acute Hypoxic Respiratory Failure in setting of multifocal PNA +Acute on Chronic Heart Failure   P:   Vent Support VAP Bundle  Trend ABG/CXR  CT Chest pending   CARDIOVASCULAR A:  Acute on Chronic Systolic/Diasoltic HF in setting of nonischemic cardiomyopathy  08/2017 EF 25-30, G1DD with severely reduced systolic function  Syncopal Events starting August 2018 H/O HTN -Follows [name] in office  P:  Cardiac Monitoring  Continue ASA ECHO  COOX Panel Pending  CVP  Hydralazine PRN for systolic >256  Hold home Coreg/Norvasc  RENAL A:   Acute on Chronic KD IV (Bas Crt 3.6-3.8)  LA >  1.4  S/P Basilic Vein Fistula in which has been malfunctioning  -Follows Dr. Posey Pronto in Office  P:   Trend BMP  Replace electrolytes as indicated  Avoid Nephrotoxins  No Indication for HD at this time > Will consult Nephrology in AM   GASTROINTESTINAL A:   SUP  P:   NPO PPI   HEMATOLOGIC A:   Macrocytic Anemia  B12 > 655 Folate >  P:  Trend CBC   INFECTIOUS A:   Multifocal PNA  Aspiration Event  HIV/AIDS -Flu negative, MRSA Negative, Step P negative, Legionella negative  -PCT 10.39  P:   ID Following  Trend WBC and Fever Curve Trend PCT  Currently on Unasyn  and Zyvox  Follow Culture Data > Obtain Sputum Culture  RVP negative  Continue Tivicay, Descovy   ENDOCRINE A:   No issues    P:   Trend Glucose   NEUROLOGIC A:   Acute Metabolic Encephalopathy  P:   RASS goal: 0/-1 Wean Precedex to achieve RASS PRN Fentanyl    FAMILY  - Updates: No family present   - Inter-disciplinary family meet or Palliative Care meeting due by: 12/01/2017   CCT: 52 minutes   Hayden Pedro, AGACNP-BC [facility]  Pgr: [phone]  PCCM Pgr: [phone]

## 2017-11-24 NOTE — Progress Notes (Addendum)
RN called to the room by patient due to not "feeling well" and having an increased work of breathing. Stats 87-90% on O2 at 5L. RR 29-33. Placed back on bipap. Stats improved to 93-95% on 50%, but RR remain 30'-40's and patient still feels he is short of breathing and demonstrates increased work of breathing.   Notified Dr Tana Coast, new orders given. Will continue to monitor for changes.   Chest xray-performed Lasix 40mg  IV given  6:30pm Dr. Rennis Harding arrived at bedside and transferred the patient to the ICU. Dr. Tana Coast aware of transfer. Patient family was notified. All belongs were sent with the patient to the unit.  Report was provided to the RN at bedside.   Update: Please see MAR for all PRN's provided prior to transfer to the unit.

## 2017-11-25 ENCOUNTER — Inpatient Hospital Stay (HOSPITAL_COMMUNITY): Payer: Medicare HMO

## 2017-11-25 DIAGNOSIS — J9690 Respiratory failure, unspecified, unspecified whether with hypoxia or hypercapnia: Secondary | ICD-10-CM

## 2017-11-25 DIAGNOSIS — I340 Nonrheumatic mitral (valve) insufficiency: Secondary | ICD-10-CM

## 2017-11-25 DIAGNOSIS — Z9911 Dependence on respirator [ventilator] status: Secondary | ICD-10-CM

## 2017-11-25 DIAGNOSIS — I351 Nonrheumatic aortic (valve) insufficiency: Secondary | ICD-10-CM

## 2017-11-25 LAB — GLUCOSE, CAPILLARY
GLUCOSE-CAPILLARY: 151 mg/dL — AB (ref 65–99)
GLUCOSE-CAPILLARY: 212 mg/dL — AB (ref 65–99)
Glucose-Capillary: 186 mg/dL — ABNORMAL HIGH (ref 65–99)
Glucose-Capillary: 167 mg/dL — ABNORMAL HIGH (ref 65–99)
Glucose-Capillary: 131 mg/dL — ABNORMAL HIGH (ref 65–99)
Glucose-Capillary: 133 mg/dL — ABNORMAL HIGH (ref 65–99)

## 2017-11-25 LAB — RESPIRATORY PANEL BY PCR
Adenovirus: NOT DETECTED
BORDETELLA PERTUSSIS-RVPCR: NOT DETECTED
CHLAMYDOPHILA PNEUMONIAE-RVPPCR: NOT DETECTED
Coronavirus 229E: NOT DETECTED
Coronavirus HKU1: NOT DETECTED
Coronavirus NL63: NOT DETECTED
Coronavirus OC43: NOT DETECTED
INFLUENZA A-RVPPCR: NOT DETECTED
Influenza B: NOT DETECTED
Metapneumovirus: NOT DETECTED
Mycoplasma pneumoniae: NOT DETECTED
PARAINFLUENZA VIRUS 3-RVPPCR: NOT DETECTED
PARAINFLUENZA VIRUS 4-RVPPCR: NOT DETECTED
Parainfluenza Virus 1: NOT DETECTED
Parainfluenza Virus 2: NOT DETECTED
RHINOVIRUS / ENTEROVIRUS - RVPPCR: NOT DETECTED
Respiratory Syncytial Virus: NOT DETECTED

## 2017-11-25 LAB — BASIC METABOLIC PANEL
ANION GAP: 10 (ref 5–15)
Anion gap: 9 (ref 5–15)
BUN: 72 mg/dL — AB (ref 6–20)
BUN: 69 mg/dL — AB (ref 6–20)
CALCIUM: 8.3 mg/dL — AB (ref 8.9–10.3)
CHLORIDE: 109 mmol/L (ref 101–111)
CHLORIDE: 105 mmol/L (ref 101–111)
CO2: 21 mmol/L — AB (ref 22–32)
CO2: 21 mmol/L — ABNORMAL LOW (ref 22–32)
CREATININE: 4.76 mg/dL — AB (ref 0.61–1.24)
Calcium: 8.1 mg/dL — ABNORMAL LOW (ref 8.9–10.3)
Creatinine, Ser: 4.64 mg/dL — ABNORMAL HIGH (ref 0.61–1.24)
GFR calc Af Amer: 13 mL/min — ABNORMAL LOW (ref >60)
GFR calc Af Amer: 13 mL/min — ABNORMAL LOW (ref >60)
GFR calc non Af Amer: 11 mL/min — ABNORMAL LOW (ref >60)
GFR calc non Af Amer: 11 mL/min — ABNORMAL LOW (ref >60)
GLUCOSE: 177 mg/dL — AB (ref 65–99)
Glucose, Bld: 171 mg/dL — ABNORMAL HIGH (ref 65–99)
Potassium: 4.5 mmol/L (ref 3.5–5.1)
Potassium: 4.7 mmol/L (ref 3.5–5.1)
Sodium: 136 mmol/L (ref 135–145)
Sodium: 139 mmol/L (ref 135–145)

## 2017-11-25 LAB — BODY FLUID CELL COUNT WITH DIFFERENTIAL
EOS FL: 0 %
Lymphs, Fluid: 6 %
Monocyte-Macrophage-Serous Fluid: 12 % — ABNORMAL LOW (ref 50–90)
NEUTROPHIL FLUID: 82 % — AB (ref 0–25)
Total Nucleated Cell Count, Fluid: 288 cu mm (ref 0–1000)

## 2017-11-25 LAB — MAGNESIUM: MAGNESIUM: 2 mg/dL (ref 1.7–2.4)

## 2017-11-25 LAB — CBC
HEMATOCRIT: 34.3 % — AB (ref 39.0–52.0)
HEMOGLOBIN: 11 g/dL — AB (ref 13.0–17.0)
MCH: 32.6 pg (ref 26.0–34.0)
MCHC: 32.1 g/dL (ref 30.0–36.0)
MCV: 101.8 fL — ABNORMAL HIGH (ref 78.0–100.0)
Platelets: 178 10*3/uL (ref 150–400)
RBC: 3.37 MIL/uL — ABNORMAL LOW (ref 4.22–5.81)
RDW: 13.9 % (ref 11.5–15.5)
WBC: 11.2 10*3/uL — ABNORMAL HIGH (ref 4.0–10.5)

## 2017-11-25 LAB — PHOSPHORUS: PHOSPHORUS: 5.3 mg/dL — AB (ref 2.5–4.6)

## 2017-11-25 LAB — URINALYSIS, ROUTINE W REFLEX MICROSCOPIC
BILIRUBIN URINE: NEGATIVE
Glucose, UA: NEGATIVE mg/dL
Hgb urine dipstick: NEGATIVE
KETONES UR: NEGATIVE mg/dL
LEUKOCYTES UA: NEGATIVE
NITRITE: NEGATIVE
PH: 5 (ref 5.0–8.0)
Protein, ur: NEGATIVE mg/dL
SPECIFIC GRAVITY, URINE: 1.014 (ref 1.005–1.030)

## 2017-11-25 LAB — TROPONIN I
TROPONIN I: 0.07 ng/mL — AB (ref <0.03)
TROPONIN I: 0.05 ng/mL — AB (ref <0.03)
Troponin I: 0.07 ng/mL (ref <0.03)

## 2017-11-25 LAB — FOLATE: Folate: 14 ng/mL (ref >5.9)

## 2017-11-25 LAB — PROCALCITONIN: Procalcitonin: 32.44 ng/mL

## 2017-11-25 MED ORDER — FENTANYL CITRATE (PF) 100 MCG/2ML IJ SOLN
INTRAMUSCULAR | Status: AC
  Filled 2017-11-25: qty 2

## 2017-11-25 MED ORDER — SULFAMETHOXAZOLE-TRIMETHOPRIM 400-80 MG/5ML IV SOLN
485.0000 mg | Freq: Two times a day (BID) | INTRAVENOUS | Status: DC
  Administered 2017-11-25 – 2017-11-27 (×5): 485 mg via INTRAVENOUS
  Filled 2017-11-25: qty 30.31
  Filled 2017-11-25 (×2): qty 30.3
  Filled 2017-11-25: qty 30.31
  Filled 2017-11-25: qty 30.3
  Filled 2017-11-25 (×2): qty 30.31

## 2017-11-25 MED ORDER — METHYLPREDNISOLONE SODIUM SUCC 40 MG IJ SOLR: 15.0000 mg | INTRAMUSCULAR | Status: DC

## 2017-11-25 MED ORDER — INSULIN ASPART 100 UNIT/ML ~~LOC~~ SOLN
0.0000 [IU] | SUBCUTANEOUS | Status: DC
  Administered 2017-11-25 (×2): 2 [IU] via SUBCUTANEOUS
  Administered 2017-11-25: 5 [IU] via SUBCUTANEOUS
  Administered 2017-11-26 – 2017-11-28 (×10): 2 [IU] via SUBCUTANEOUS
  Administered 2017-11-28: 3 [IU] via SUBCUTANEOUS
  Administered 2017-11-28: 2 [IU] via SUBCUTANEOUS
  Administered 2017-11-29: 3 [IU] via SUBCUTANEOUS
  Administered 2017-11-29: 2 [IU] via SUBCUTANEOUS
  Administered 2017-11-30: 3 [IU] via SUBCUTANEOUS
  Administered 2017-11-30 (×2): 2 [IU] via SUBCUTANEOUS
  Administered 2017-12-01: 3 [IU] via SUBCUTANEOUS

## 2017-11-25 MED ORDER — ETOMIDATE 2 MG/ML IV SOLN
20.0000 mg | Freq: Once | INTRAVENOUS | Status: AC
  Administered 2017-11-25: 20 mg via INTRAVENOUS

## 2017-11-25 MED ORDER — METHYLPREDNISOLONE SODIUM SUCC 40 MG IJ SOLR
30.0000 mg | Freq: Two times a day (BID) | INTRAMUSCULAR | Status: DC
  Administered 2017-11-25 (×2): 30 mg via INTRAVENOUS

## 2017-11-25 MED ORDER — CHLORHEXIDINE GLUCONATE 0.12% ORAL RINSE (MEDLINE KIT)
15.0000 mL | Freq: Two times a day (BID) | OROMUCOSAL | Status: DC
  Administered 2017-11-25 – 2017-12-01 (×11): 15 mL via OROMUCOSAL

## 2017-11-25 MED ORDER — METHYLPREDNISOLONE SODIUM SUCC 40 MG IJ SOLR: 30.0000 mg | INTRAMUSCULAR | Status: DC

## 2017-11-25 MED ORDER — CHLORHEXIDINE GLUCONATE CLOTH 2 % EX PADS
6.0000 | MEDICATED_PAD | Freq: Every day | CUTANEOUS | Status: DC
  Administered 2017-11-25 – 2017-11-29 (×5): 6 via TOPICAL

## 2017-11-25 MED ORDER — METHYLPREDNISOLONE SODIUM SUCC 40 MG IJ SOLR
30.0000 mg | Freq: Two times a day (BID) | INTRAMUSCULAR | Status: DC
  Filled 2017-11-25: qty 0.75

## 2017-11-25 MED ORDER — ORAL CARE MOUTH RINSE
15.0000 mL | Freq: Four times a day (QID) | OROMUCOSAL | Status: DC
  Administered 2017-11-25 – 2017-11-30 (×15): 15 mL via OROMUCOSAL

## 2017-11-25 MED ORDER — FENTANYL CITRATE (PF) 100 MCG/2ML IJ SOLN
100.0000 ug | Freq: Once | INTRAMUSCULAR | Status: AC
  Administered 2017-11-25: 100 ug via INTRAVENOUS

## 2017-11-25 MED ORDER — MIDAZOLAM HCL 2 MG/2ML IJ SOLN
INTRAMUSCULAR | Status: AC
  Filled 2017-11-25: qty 2

## 2017-11-25 MED ORDER — MIDAZOLAM HCL 2 MG/2ML IJ SOLN
2.0000 mg | Freq: Once | INTRAMUSCULAR | Status: AC
  Administered 2017-11-25: 2 mg via INTRAVENOUS

## 2017-11-25 MED ORDER — ETOMIDATE 2 MG/ML IV SOLN
INTRAVENOUS | Status: AC
  Administered 2017-11-25: 20 mg via INTRAVENOUS
  Filled 2017-11-25: qty 10

## 2017-11-25 NOTE — Procedures (Signed)
Bedside Bronchoscopy Procedure Note David [name] [id number] Feb 10, 1941  Procedure: Bronchoscopy Indications: Obtain specimens for culture and/or other diagnostic studies  Procedure Details: ET Tube Size: ET Tube secured at lip (cm): Bite block in place: No In preparation for procedure, Patient hyper-oxygenated with 100 % FiO2 Airway entered and the following bronchi were examined: RUL, RML, RLL, LUL, LLL and Bronchi.   Bronchoscope removed.  , Patient placed back on 100% FiO2 at conclusion of procedure.    Evaluation BP 119/78   Pulse 67   Temp 98.6 F (37 C) (Oral)   Resp 14   Ht 6' (1.829 m)   Wt 214 lb 1.1 oz (97.1 kg)   SpO2 100%   BMI 29.03 kg/m  Breath Sounds:Clear O2 sats: stable throughout Patient's Current Condition: stable Specimens:  Sent serosanguinous fluid Complications: No apparent complications Patient did tolerate procedure well.   Sheryl Towell V 11/25/2017, 11:38 AM

## 2017-11-25 NOTE — Procedures (Signed)
Bronchoscopy Procedure Note David [name] [id number] 22-Dec-1940  Procedure: Bronchoscopy Indications: Obtain specimens for culture and/or other diagnostic studies  Procedure Details Consent: Risks of procedure as well as the alternatives and risks of each were explained to the (patient/caregiver).  Consent for procedure obtained. Time Out: Verified patient identification, verified procedure, site/side was marked, verified correct patient position, special equipment/implants available, medications/allergies/relevent history reviewed, required imaging and test results available.  Performed  In preparation for procedure, patient was given 100% FiO2 and bronchoscope lubricated. Sedation: Benzodiazepines and Etomidate  Airway entered and the following bronchi were examined: RUL, RML, RLL, LUL and LLL.  Airway anatomy was normal Procedures performed: BAL performed with 60cc aliquots in bilateral lower lobes  Evaluation Hemodynamic Status: BP stable throughout; O2 sats: stable throughout Patient's Current Condition: stable Complications: No apparent complications Patient did tolerate procedure well.   [name] 11/25/2017

## 2017-11-25 NOTE — Progress Notes (Signed)
Transported pt on 11/24/17 to 1M while on Bipap

## 2017-11-25 NOTE — Progress Notes (Signed)
INFECTIOUS DISEASE PROGRESS NOTE  ID: David [name] is a 76 y.o. male with  Principal Problem:   Sepsis due to pneumonia [facility]) Active Problems:   Essential hypertension   Chronic combined systolic and diastolic heart failure (HCC)   Macrocytic anemia   CKD (chronic kidney disease) stage 4, GFR 15-29 ml/min (HCC)   AIDS (HCC)   Acute renal failure superimposed on stage 4 chronic kidney disease ([place])   Community acquired pneumonia of right lung   Aspiration pneumonia of both lower lobes due to regurgitated food ([place])  Subjective: Required intubation o/n He is awake and alert, interactive.  No distress.   Abtx:  Anti-infectives (From admission, onward)   Start     Dose/Rate Route Frequency Ordered Stop   11/25/17 0600  Ampicillin-Sulbactam (UNASYN) 3 g in sodium chloride 0.9 % 100 mL IVPB     3 g 200 mL/hr over 30 Minutes Intravenous Every 12 hours 11/24/17 2001     11/24/17 2200  vancomycin (VANCOCIN) IVPB 1000 mg/200 mL premix  Status:  Discontinued     1,000 mg 200 mL/hr over 60 Minutes Intravenous Every 48 hours 11/23/17 0143 11/23/17 1040   11/24/17 2100  piperacillin-tazobactam (ZOSYN) IVPB 2.25 g  Status:  Discontinued     2.25 g 100 mL/hr over 30 Minutes Intravenous Every 8 hours 11/24/17 1956 11/24/17 2000   11/24/17 2100  levofloxacin (LEVAQUIN) IVPB 500 mg  Status:  Discontinued     500 mg 100 mL/hr over 60 Minutes Intravenous Every 48 hours 11/24/17 1956 11/24/17 2000   11/23/17 2200  cefTRIAXone (ROCEPHIN) 1 g in dextrose 5 % 50 mL IVPB  Status:  Discontinued     1 g 100 mL/hr over 30 Minutes Intravenous Every 24 hours 11/23/17 0004 11/23/17 1556   11/23/17 2200  azithromycin (ZITHROMAX) 500 mg in dextrose 5 % 250 mL IVPB  Status:  Discontinued     500 mg 250 mL/hr over 60 Minutes Intravenous Every 24 hours 11/23/17 0004 11/24/17 1058   11/23/17 1700  Ampicillin-Sulbactam (UNASYN) 3 g in sodium chloride 0.9 % 100 mL IVPB  Status:  Discontinued     3 g 200  mL/hr over 30 Minutes Intravenous Every 12 hours 11/23/17 1613 11/24/17 1955   11/23/17 1200  dolutegravir (TIVICAY) tablet 50 mg     50 mg Oral Daily 11/23/17 0004     11/23/17 1200  emtricitabine-tenofovir AF (DESCOVY) 200-25 MG per tablet 1 tablet     1 tablet Oral Daily 11/23/17 0004     11/23/17 1045  linezolid (ZYVOX) IVPB 600 mg    Comments:  Please adjust dose according to renal function   600 mg 300 mL/hr over 60 Minutes Intravenous Every 12 hours 11/23/17 1042     11/23/17 1000  bictegravir-emtricitabine-tenofovir AF ([name]) 50-200-25 MG per tablet 1 tablet  Status:  Discontinued     1 tablet Oral Daily 11/23/17 0004 11/23/17 0439   11/23/17 0100  sulfamethoxazole-trimethoprim (BACTRIM DS,SEPTRA DS) 800-160 MG per tablet 1 tablet  Status:  Discontinued     1 tablet Oral Every 12 hours 11/23/17 0004 11/23/17 1041   11/23/17 0045  vancomycin (VANCOCIN) 1,500 mg in sodium chloride 0.9 % 500 mL IVPB     1,500 mg 250 mL/hr over 120 Minutes Intravenous  Once 11/23/17 0037 11/23/17 0328   11/22/17 2245  cefTRIAXone (ROCEPHIN) 1 g in dextrose 5 % 50 mL IVPB     1 g 100 mL/hr over 30 Minutes Intravenous  Once 11/22/17 2231 11/23/17 0041   11/22/17 2245  azithromycin (ZITHROMAX) 500 mg in dextrose 5 % 250 mL IVPB     500 mg 250 mL/hr over 60 Minutes Intravenous  Once 11/22/17 2231 11/23/17 0142      Medications:  Scheduled: . aspirin  81 mg Oral Daily  . brimonidine  1 drop Left Eye BID  . chlorhexidine gluconate (MEDLINE KIT)  15 mL Mouth Rinse BID  . dolutegravir  50 mg Oral Daily  . dorzolamide-timolol  1 drop Left Eye BID  . emtricitabine-tenofovir AF  1 tablet Oral Daily  . heparin  5,000 Units Subcutaneous Q8H  . latanoprost  1 drop Left Eye QHS  . mouth rinse  15 mL Mouth Rinse QID  . pantoprazole (PROTONIX) IV  40 mg Intravenous Q24H  . sodium chloride flush  3 mL Intravenous Q12H    Objective: Vital signs in last 24 hours: Temp:  [97.6 F (36.4 C)-99 F (37.2  C)] 98.6 F (37 C) (12/23 0807) Pulse Rate:  [67-111] 67 (12/23 0900) Resp:  [14-47] 14 (12/23 0900) BP: (94-184)/(63-141) 119/78 (12/23 0900) SpO2:  [61 %-100 %] 100 % (12/23 0900) FiO2 (%):  [40 %-100 %] 40 % (12/23 0800) Weight:  [97.1 kg (214 lb 1.1 oz)] 97.1 kg (214 lb 1.1 oz) (12/23 0500)   General appearance: alert, cooperative and no distress Resp: rhonchi anterior - bilateral Cardio: regular rate and rhythm GI: normal findings: bowel sounds normal and soft, non-tender Extremities: edema none  Lab Results Recent Labs    11/24/17 0335 11/25/17 0009 11/25/17 0611  WBC 13.9*  --  11.2*  HGB 10.8*  --  11.0*  HCT 32.8*  --  34.3*  NA 138 136 139  K 4.5 4.5 4.7  CL 107 105 109  CO2 22 21* 21*  BUN 66* 69* 72*  CREATININE 4.61* 4.64* 4.76*   Liver Panel Recent Labs    11/22/17 2224  PROT 7.5  ALBUMIN 3.7  AST 22  ALT 20  ALKPHOS 104  BILITOT 0.6   Sedimentation Rate No results for input(s): ESRSEDRATE in the last 72 hours. C-Reactive Protein No results for input(s): CRP in the last 72 hours.  Microbiology: Recent Results (from the past 240 hour(s))  Blood culture (routine x 2)     Status: None (Preliminary result)   Collection Time: 11/22/17 11:40 PM  Result Value Ref Range Status   Specimen Description BLOOD RIGHT HAND  Final   Special Requests   Final    BOTTLES DRAWN AEROBIC AND ANAEROBIC Blood Culture adequate volume   Culture NO GROWTH 1 DAY  Final   Report Status PENDING  Incomplete  MRSA PCR Screening     Status: None   Collection Time: 11/23/17  6:41 AM  Result Value Ref Range Status   MRSA by PCR NEGATIVE NEGATIVE Final    Comment:        The GeneXpert MRSA Assay (FDA approved for NASAL specimens only), is one component of a comprehensive MRSA colonization surveillance program. It is not intended to diagnose MRSA infection nor to guide or monitor treatment for MRSA infections.   Culture, respiratory (NON-Expectorated)     Status:  None (Preliminary result)   Collection Time: 11/24/17  9:00 PM  Result Value Ref Range Status   Specimen Description TRACHEAL ASPIRATE  Final   Special Requests NONE  Final   Gram Stain   Final    MODERATE WBC PRESENT,BOTH PMN AND MONONUCLEAR NO SQUAMOUS EPITHELIAL  CELLS SEEN RARE BUDDING YEAST SEEN    Culture PENDING  Incomplete   Report Status PENDING  Incomplete  Respiratory Panel by PCR     Status: None   Collection Time: 11/25/17  2:09 AM  Result Value Ref Range Status   Adenovirus NOT DETECTED NOT DETECTED Final   Coronavirus 229E NOT DETECTED NOT DETECTED Final   Coronavirus HKU1 NOT DETECTED NOT DETECTED Final   Coronavirus NL63 NOT DETECTED NOT DETECTED Final   Coronavirus OC43 NOT DETECTED NOT DETECTED Final   Metapneumovirus NOT DETECTED NOT DETECTED Final   Rhinovirus / Enterovirus NOT DETECTED NOT DETECTED Final   Influenza A NOT DETECTED NOT DETECTED Final   Influenza B NOT DETECTED NOT DETECTED Final   Parainfluenza Virus 1 NOT DETECTED NOT DETECTED Final   Parainfluenza Virus 2 NOT DETECTED NOT DETECTED Final   Parainfluenza Virus 3 NOT DETECTED NOT DETECTED Final   Parainfluenza Virus 4 NOT DETECTED NOT DETECTED Final   Respiratory Syncytial Virus NOT DETECTED NOT DETECTED Final   Bordetella pertussis NOT DETECTED NOT DETECTED Final   Chlamydophila pneumoniae NOT DETECTED NOT DETECTED Final   Mycoplasma pneumoniae NOT DETECTED NOT DETECTED Final    Studies/Results: Dg Chest 1 View  Result Date: 11/24/2017 CLINICAL DATA:  Endotracheal tube placement. EXAM: CHEST 1 VIEW COMPARISON:  11/24/2017 FINDINGS: Interval placement of endotracheal tube with tip 6.3 cm above the carina. Interval placement of enteric tube which courses off the inferior portion of the film as non radiopaque port lies approximately 5 cm above the diaphragm. Lungs are adequately inflated demonstrate slight interval worsening of airspace opacification over the right lung and minimally in the left  perihilar region. No definite effusion. Mild stable cardiomegaly. Remainder of the exam is unchanged. IMPRESSION: Possible slight interval worsening of multifocal airspace process most involving the right lung likely multifocal pneumonia. Stable cardiomegaly. Tubes and lines as described. Electronically Signed   By: Marin Olp M.D.   On: 11/24/2017 19:38   Dg Chest 2 View  Result Date: 11/23/2017 CLINICAL DATA:  Shortness of breath cough and hypoxia EXAM: CHEST  2 VIEW COMPARISON:  11/22/2017, 01/25/2010 FINDINGS: Improved aeration bilaterally. No pleural effusion. Mild residual pulmonary infiltrates in the right upper lobe and bilateral lung bases. Stable cardiomegaly with mild central vascular congestion. No pneumothorax. Degenerative changes of the spine. IMPRESSION: 1. Decreased right upper and bilateral lower lobe pulmonary infiltrates or edema. 2. Cardiomegaly with vascular congestion. Electronically Signed   By: Donavan Foil M.D.   On: 11/23/2017 22:40   Ct Chest Wo Contrast  Result Date: 11/25/2017 CLINICAL DATA:  Acute respiratory illness EXAM: CT CHEST WITHOUT CONTRAST TECHNIQUE: Multidetector CT imaging of the chest was performed following the standard protocol without IV contrast. COMPARISON:  Chest x-rays over the past 3 days.  Chest CT 04/29/2014 FINDINGS: Cardiovascular: Cardiomegaly without pericardial effusion. Diffuse coronary atherosclerotic calcification. No acute vascular finding. The main pulmonary artery is enlarged at 5 cm, suggesting pulmonary hypertension. Patient had echocardiography yesterday. Mediastinum/Nodes: Endotracheal tube tip between the clavicular heads and carina. Right IJ line with tip near the SVC origin. An orogastric tube tip is at the GE junction. Lungs/Pleura: Endotracheal tube tip between the clavicular heads and carina. The central airways are clear. There is bilateral dense consolidation of the lower lobes in the right upper lobe. On admission chest x-ray  there was dense asymmetric right-sided consolidation. Alveolar edema could be superimposed, although there is no Kerley lines and no significant pleural effusion. Negative for cavitation. Small  area of scarring and mild bronchiectasis in the right upper lobe. No pneumothorax. Upper Abdomen: Partly seen 1 cm left adrenal adenoma. Musculoskeletal: No acute finding. Spondylosis. Sternoclavicular arthropathy with chondrocalcinosis IMPRESSION: 1. History of pneumonia with extensive bilateral consolidation affecting the majority of the lower lobes. No cavitation or significant effusion. 2. Cardiomegaly. Alveolar edema could contribute to the airspace disease but there are no septal lines. 3. Orogastric tube tip at the GE junction. Electronically Signed   By: Monte Fantasia M.D.   On: 11/25/2017 06:36   Dg Chest Port 1 View  Result Date: 11/24/2017 CLINICAL DATA:  76 year old male with central line placement. EXAM: PORTABLE CHEST 1 VIEW COMPARISON:  Radiograph dated 11/24/2017 FINDINGS: There has been interval placement of a right IJ central line the tip over central SVC. No pneumothorax. Endotracheal tube remains above the carina. Enteric tube extends into the left hemiabdomen with tip likely in the proximal stomach just distal to the gastroesophageal junction. The side port of the tube appears to be a above the expected level of the GE junction. Recommend further advancing of the tube into the stomach. Diffuse bilateral airspace opacities, right greater left similar to prior radiograph. Probable small bilateral pleural effusions. No pneumothorax. There is cardiomegaly. No acute osseous pathology. IMPRESSION: 1. Right IJ central line with tip over central SVC. No pneumothorax. 2. Enteric tube with tip just beyond the GE junction. Recommend further advancing the tube into the stomach. Endotracheal tube remains above the carina. 3. No significant interval change in the appearance of the airspace opacity compared to  the earlier radiograph. 4. Cardiomegaly. Electronically Signed   By: Anner Crete M.D.   On: 11/24/2017 23:43   Dg Chest Port 1 View  Result Date: 11/24/2017 CLINICAL DATA:  76 year old male with progressive shortness of breath EXAM: PORTABLE CHEST 1 VIEW COMPARISON:  Recent chest x-ray obtained yesterday 11/23/2017 FINDINGS: Stable marked cardiomegaly. Persistent patchy airspace opacity in the right upper and lower lobes. No new pulmonary edema, pleural effusion or pneumothorax. Overall, minimal change in the appearance of the lungs. No acute osseous abnormality. IMPRESSION: No significant interval change in the appearance of the lungs over the last 24 hours. Persistent patchy airspace opacity in the right upper and lower lobes concerning for multifocal pneumonia. Electronically Signed   By: Jacqulynn Cadet M.D.   On: 11/24/2017 17:58     Assessment/Plan: AIDS Pneumonia Respiratory failure, VDRF CKD 4  Will add PCP rx (steroids, bactrim) Pharm to dose with CKD appreciate CCM eval, he will get BAL Continue tivicay-descovey Watch fluid balance with ESRD  Total days of antibiotics: 2 unasyn/zyvox         Bobby Rumpf MD, FACP Infectious Diseases (pager) [phone] [email or web address] 11/25/2017, 10:04 AM  LOS: 2 days

## 2017-11-25 NOTE — Progress Notes (Signed)
PULMONARY / CRITICAL CARE MEDICINE   Name: David [name] MRN: [id number] DOB: [birth year]    ADMISSION DATE:  11/22/2017 CONSULTATION DATE:  11/24/2017  REFERRING MD:  David [name]  CHIEF COMPLAINT:  Dyspnea   HISTORY OF PRESENT ILLNESS:  Information obtained via medical record as patient is patient is intubated and sedated   76 year old male with PMH of Anemia, CKD stage IV, HIV (CD4 310 and VL undetectable in October), HLD, HTN, Chronic mixed systolic/diastolic HF (EF 67-67, M0NO with severely reduced systolic dysfunction), prostate CA s/p brachytherapy    Presents to ED on 12/21 with progressive dyspnea with productive cough. CXR with extensive consolidation of right middle and lower lobes as well as left base, concerning for multifocal PNA. Given Azithromycin and Rocephin, placed on BiPAP and admitted to step-down.   On 12/22 patient became progressively hypoxic with increased work of breathing. CXR with persistent infiltrates. Given Lasix 40 x 1, Solu-medrol 125 mg x 1, morphine 0.5 mg x 1. ABG 7.2/56/89. Intubated and transferred to ICU.    PAST MEDICAL HISTORY :  He  has a past medical history of Anemia, iron deficiency (11/15/2011), Arthritis, Cancer (David [name]), Chronic kidney disease, Glaucoma, Heart murmur, History of cardiac cath (2004), History of myocardial perfusion scan (02/2010), HIV infection (David [name]), Hyperlipidemia, Hypertension, Nonischemic cardiomyopathy (David [name]), Sinus bradycardia, Stroke (David [name]), and Systolic and diastolic CHF, acute (David [name]) (06/2010).  PAST SURGICAL HISTORY: He  has a past surgical history that includes Radioactive seed implant (2010); Cardiac catheterization (02/04/2003); US ECHOCARDIOGRAPHY (02/06/2012); NM MYOCAR PERF WALL MOTION (02/21/2010); Esophagogastroduodenoscopy (egd) with propofol (N/A, 05/05/2014); Eye surgery (Bilateral); Eye surgery (Bilateral); Colonoscopy; Bascilic vein transposition (Left, 06/24/2015); and AV fistula placement (Right, 10/13/2016).  Allergies   Allergen Reactions  . Codeine Other (See Comments)    Causes constipation  . Cozaar [Losartan Potassium] Other (See Comments)    Causes constipation     No current facility-administered medications on file prior to encounter.    Current Outpatient Medications on File Prior to Encounter  Medication Sig  . acetaminophen (TYLENOL) 500 MG tablet Take 500 mg by mouth every 4 (four) hours as needed (for muscle pain).   Marland Kitchen amLODipine (NORVASC) 10 MG tablet TAKE 1 TABLET EVERY DAY (Patient taking differently: Take 10 mg by mouth once a day)  . aspirin 325 MG EC tablet Take 325 mg by mouth every evening.   . brimonidine (ALPHAGAN) 0.15 % ophthalmic solution Place 1 drop into the left eye 2 (two) times daily.   . dolutegravir (TIVICAY) 50 MG tablet Take 1 tablet (50 mg total) by mouth daily.  . dorzolamide-timolol (COSOPT) 22.3-6.8 MG/ML ophthalmic solution Place 1 drop into the left eye 2 (two) times daily.   Marland Kitchen emtricitabine-tenofovir AF (DESCOVY) 200-25 MG tablet Take 1 tablet by mouth daily.  . furosemide (LASIX) 40 MG tablet Take 2 tablets (80 mg total) by mouth daily. (Patient taking differently: Take 80 mg by mouth every morning. )  . latanoprost (XALATAN) 0.005 % ophthalmic solution Place 1 drop into the left eye at bedtime.   . ranitidine (ZANTAC) 150 MG tablet Take 1 tablet (150 mg total) by mouth 2 (two) times daily.  . Vitamin D, Ergocalciferol, (DRISDOL) 50000 UNITS CAPS capsule Take 50,000 units by mouth once a week on Saturdays  . carvedilol (COREG) 25 MG tablet TAKE 1 TABLET TWICE DAILY (Patient taking differently: Take 25 mg by mouth two times a day)    FAMILY HISTORY:  His indicated that his mother is deceased.  He indicated that his father is deceased. He indicated that the status of his maternal grandmother is unknown. He indicated that his daughter is alive. He indicated that his son is alive. He indicated that the status of his neg hx is unknown.   SOCIAL HISTORY: He   reports that he quit smoking about 11 years ago. he has never used smokeless tobacco. He reports that he drinks alcohol. He reports that he does not use drugs.  REVIEW OF SYSTEMS:   Unable to review as patient is intubated and sedated   SUBJECTIVE:  Intubated overnight for worsening respiratory distress Stable today morning  VITAL SIGNS: BP 119/78   Pulse 67   Temp 98.6 F (37 C) (Oral)   Resp 14   Ht 6' (1.829 m)   Wt 214 lb 1.1 oz (97.1 kg)   SpO2 100%   BMI 29.03 kg/m   HEMODYNAMICS: CVP:  [8 mmHg] 8 mmHg  VENTILATOR SETTINGS: Vent Mode: PSV;CPAP FiO2 (%):  [40 %-100 %] 40 % Set Rate:  [14 bmp] 14 bmp Vt Set:  [620 mL] 620 mL PEEP:  [5 cmH20-10 cmH20] 10 cmH20 Pressure Support:  [16 cmH20] 16 cmH20 Plateau Pressure:  [24 cmH20] 24 cmH20  INTAKE / OUTPUT: I/O last 3 completed shifts: In: 1911 [P.O.:840; I.V.:321; IV Piggyback:750] Out: 1325 [Urine:1325]  PHYSICAL EXAMINATION: Gen:      No acute distress HEENT:  EOMI, sclera anicteric, ETT tube in place Neck:     No masses; no thyromegaly Lungs:    Clear to auscultation bilaterally; normal respiratory effort CV:         Regular rate and rhythm; no murmurs Abd:      + bowel sounds; soft, non-tender; no palpable masses, no distension Ext:    No edema; adequate peripheral perfusion Skin:      Warm and dry; no rash Neuro: Awake, responds to commands  LABS:  BMET Recent Labs  Lab 11/24/17 0335 11/25/17 0009 11/25/17 0611  NA 138 136 139  K 4.5 4.5 4.7  CL 107 105 109  CO2 22 21* 21*  BUN 66* 69* 72*  CREATININE 4.61* 4.64* 4.76*  GLUCOSE 139* 177* 171*    Electrolytes Recent Labs  Lab 11/24/17 0335 11/25/17 0009 11/25/17 0611  CALCIUM 8.5* 8.1* 8.3*  MG  --   --  2.0  PHOS  --   --  5.3*    CBC Recent Labs  Lab 11/23/17 0327 11/24/17 0335 11/25/17 0611  WBC 11.7* 13.9* 11.2*  HGB 11.7* 10.8* 11.0*  HCT 36.1* 32.8* 34.3*  PLT 238 210 178    Coag's No results for input(s): APTT, INR  in the last 168 hours.  Sepsis Markers Recent Labs  Lab 11/22/17 2230 11/24/17 2045 11/25/17 0611  LATICACIDVEN 1.05 1.4  --   PROCALCITON  --  10.39 32.44    ABG Recent Labs  Lab 11/22/17 2247 11/24/17 1816 11/24/17 2115  PHART 7.300* 7.225* 7.341*  PCO2ART 41.6 56.7* 38.1  PO2ART 87.0 69.4* 71.0*    Liver Enzymes Recent Labs  Lab 11/22/17 2224  AST 22  ALT 20  ALKPHOS 104  BILITOT 0.6  ALBUMIN 3.7    Cardiac Enzymes Recent Labs  Lab 11/24/17 2045 11/25/17 0611 11/25/17 0816  TROPONINI 0.06* 0.07* 0.07*    Glucose Recent Labs  Lab 11/22/17 2223 11/23/17 1650 11/25/17 0019 11/25/17 0413 11/25/17 0802  GLUCAP 198* 221* 151* 186* 167*    Imaging Dg Chest 1 View  Result Date: 11/24/2017  CLINICAL DATA:  Endotracheal tube placement. EXAM: CHEST 1 VIEW COMPARISON:  11/24/2017 FINDINGS: Interval placement of endotracheal tube with tip 6.3 cm above the carina. Interval placement of enteric tube which courses off the inferior portion of the film as non radiopaque port lies approximately 5 cm above the diaphragm. Lungs are adequately inflated demonstrate slight interval worsening of airspace opacification over the right lung and minimally in the left perihilar region. No definite effusion. Mild stable cardiomegaly. Remainder of the exam is unchanged. IMPRESSION: Possible slight interval worsening of multifocal airspace process most involving the right lung likely multifocal pneumonia. Stable cardiomegaly. Tubes and lines as described. Electronically Signed   By: Marin Olp M.D.   On: 11/24/2017 19:38   Ct Chest Wo Contrast  Result Date: 11/25/2017 CLINICAL DATA:  Acute respiratory illness EXAM: CT CHEST WITHOUT CONTRAST TECHNIQUE: Multidetector CT imaging of the chest was performed following the standard protocol without IV contrast. COMPARISON:  Chest x-rays over the past 3 days.  Chest CT 04/29/2014 FINDINGS: Cardiovascular: Cardiomegaly without pericardial  effusion. Diffuse coronary atherosclerotic calcification. No acute vascular finding. The main pulmonary artery is enlarged at 5 cm, suggesting pulmonary hypertension. Patient had echocardiography yesterday. Mediastinum/Nodes: Endotracheal tube tip between the clavicular heads and carina. Right IJ line with tip near the SVC origin. An orogastric tube tip is at the GE junction. Lungs/Pleura: Endotracheal tube tip between the clavicular heads and carina. The central airways are clear. There is bilateral dense consolidation of the lower lobes in the right upper lobe. On admission chest x-ray there was dense asymmetric right-sided consolidation. Alveolar edema could be superimposed, although there is no Kerley lines and no significant pleural effusion. Negative for cavitation. Small area of scarring and mild bronchiectasis in the right upper lobe. No pneumothorax. Upper Abdomen: Partly seen 1 cm left adrenal adenoma. Musculoskeletal: No acute finding. Spondylosis. Sternoclavicular arthropathy with chondrocalcinosis IMPRESSION: 1. History of pneumonia with extensive bilateral consolidation affecting the majority of the lower lobes. No cavitation or significant effusion. 2. Cardiomegaly. Alveolar edema could contribute to the airspace disease but there are no septal lines. 3. Orogastric tube tip at the GE junction. Electronically Signed   By: Monte Fantasia M.D.   On: 11/25/2017 06:36   Dg Chest Port 1 View  Result Date: 11/24/2017 CLINICAL DATA:  76 year old male with central line placement. EXAM: PORTABLE CHEST 1 VIEW COMPARISON:  Radiograph dated 11/24/2017 FINDINGS: There has been interval placement of a right IJ central line the tip over central SVC. No pneumothorax. Endotracheal tube remains above the carina. Enteric tube extends into the left hemiabdomen with tip likely in the proximal stomach just distal to the gastroesophageal junction. The side port of the tube appears to be a above the expected level of  the GE junction. Recommend further advancing of the tube into the stomach. Diffuse bilateral airspace opacities, right greater left similar to prior radiograph. Probable small bilateral pleural effusions. No pneumothorax. There is cardiomegaly. No acute osseous pathology. IMPRESSION: 1. Right IJ central line with tip over central SVC. No pneumothorax. 2. Enteric tube with tip just beyond the GE junction. Recommend further advancing the tube into the stomach. Endotracheal tube remains above the carina. 3. No significant interval change in the appearance of the airspace opacity compared to the earlier radiograph. 4. Cardiomegaly. Electronically Signed   By: Anner Crete M.D.   On: 11/24/2017 23:43   Dg Chest Port 1 View  Result Date: 11/24/2017 CLINICAL DATA:  76 year old male with progressive shortness of breath  EXAM: PORTABLE CHEST 1 VIEW COMPARISON:  Recent chest x-ray obtained yesterday 11/23/2017 FINDINGS: Stable marked cardiomegaly. Persistent patchy airspace opacity in the right upper and lower lobes. No new pulmonary edema, pleural effusion or pneumothorax. Overall, minimal change in the appearance of the lungs. No acute osseous abnormality. IMPRESSION: No significant interval change in the appearance of the lungs over the last 24 hours. Persistent patchy airspace opacity in the right upper and lower lobes concerning for multifocal pneumonia. Electronically Signed   By: Jacqulynn Cadet M.D.   On: 11/24/2017 17:58   STUDIES:  CXR 12/21 > Decreased right upper and bilateral lower lobe pulmonary infiltrates or edema. 2. Cardiomegaly with vascular congestion. CXR 12/22 > Possible slight interval worsening of multifocal airspace process most involving the right lung likely multifocal pneumonia CT chest 12/23 > multifocal consolidation consistent with pneumonia.  CULTURES: Blood 12/20 >> MRSA PCR 12/21 > Negative  Step Pneumo 12/21 > Negative  L. Pneumo 12/21 > Negative  Blood 12/22  >> Sputum 12/22 >>   ANTIBIOTICS: Unasyn 12/21 >> Azithromycin 12/20 > 12/21 Linezolid 12/21 >>  SIGNIFICANT EVENTS: 12/21 > Presents to ED   LINES/TUBES: ETT 12/22 >> RIJ CVC 12/22 >>  DISCUSSION: 76 year old male presents to ED with multilobar PNA. Admitted to step-down. On 12/22 required intubation.   ASSESSMENT / PLAN:  PULMONARY A: Acute Hypoxic Respiratory Failure in setting of multifocal PNA +Acute on Chronic Heart Failure   P:   Continue vent support Follow chest x-ray Plan for bronch with BAL today   CARDIOVASCULAR A:  Acute on Chronic Systolic/Diasoltic HF in setting of nonischemic cardiomyopathy  08/2017 EF 25-30, G1DD with severely reduced systolic function  Syncopal Events starting August 2018 H/O HTN -Follows [name] in office  P:  Cardiac Monitoring  Continue ASA Follow echo Hydralazine PRN for systolic >355  Hold home Coreg/Norvasc  RENAL A:   Acute on Chronic KD IV (Bas Crt 3.6-3.8)  LA > 1.4  S/P Basilic Vein Fistula in which has been malfunctioning  -Follows Dr. Posey Pronto in Office  P:   Trend BMP  Replace electrolytes as indicated  Avoid Nephrotoxins  No Indication for HD at this time  GASTROINTESTINAL A:   SUP  P:   NPO PPI   HEMATOLOGIC A:   Macrocytic Anemia  B12 > 655 Folate >  P:  Trend CBC   INFECTIOUS A:   Multifocal PNA  Aspiration Event  HIV/AIDS -Flu negative, MRSA Negative, Step P negative, Legionella negative  -PCT 10.39  P:   ID Following  Trend WBC and Fever Curve Trend PCT  Currently on Unasyn and Zyvox  Follow Culture Data > Obtain Sputum Culture  RVP negative  Continue Tivicay, Descovy   Discssed with ID. Will start PCP therapy and steroids today. Follow BAL results  ENDOCRINE A:   Hyperglycemia  P:   Trend Glucose  Start SSI coverage  NEUROLOGIC A:   Acute Metabolic Encephalopathy  P:   RASS goal: 0/-1 Wean Precedex to achieve RASS PRN Fentanyl   FAMILY  - Updates: Pt and  family updated at bedside.  - Inter-disciplinary family meet or Palliative Care meeting due by: 12/01/2017  The patient is critically ill with multiple organ system failure and requires high complexity decision making for assessment and support, frequent evaluation and titration of therapies, advanced monitoring, review of radiographic studies and interpretation of complex data.   Critical Care Time devoted to patient care services, exclusive of separately billable procedures, described in  this note is 35 minutes.   Marshell Garfinkel MD [facility] Pager [phone] If no answer or after 3pm call: [phone] 11/25/2017, 10:12 AM

## 2017-11-25 NOTE — Progress Notes (Signed)
Patient transported from 2M07 to CT and back without any complications.

## 2017-11-25 NOTE — Progress Notes (Signed)
*  PRELIMINARY RESULTS* Echocardiogram 2D Echocardiogram has been performed.  Samuel Germany 11/25/2017, 3:05 PM

## 2017-11-25 NOTE — Progress Notes (Signed)
Pharmacy Antibiotic Note  David [name] is a 76 y.o. male admitted on 11/22/2017 with SOB, cough and hypoxia. Pt continues on unasyn and linezolid and now adding bactrim for PCP. Pt is afebrile and WBC is elevated at 11.2. Renal function remains poor with SCr at 4.76.   Plan: Bactrim 5mg /kg IV Q12H Continue unasyn and linezolid as ordered F/u renal fxn, C&S, clinical status and ID recommendations   Height: 6' (182.9 cm) Weight: 214 lb 1.1 oz (97.1 kg) IBW/kg (Calculated) : 77.6  Temp (24hrs), Avg:98.5 F (36.9 C), Min:97.6 F (36.4 C), Max:99 F (37.2 C)  Recent Labs  Lab 11/22/17 2224 11/22/17 2230 11/23/17 0327 11/24/17 0335 11/24/17 2045 11/25/17 0009 11/25/17 0611  WBC 17.1*  --  11.7* 13.9*  --   --  11.2*  CREATININE 4.82* 4.90* 4.71* 4.61*  --  4.64* 4.76*  LATICACIDVEN  --  1.05  --   --  1.4  --   --     Estimated Creatinine Clearance: 16.2 mL/min (A) (by C-G formula based on SCr of 4.76 mg/dL (H)).    Allergies  Allergen Reactions  . Codeine Other (See Comments)    Causes constipation  . Cozaar [Losartan Potassium] Other (See Comments)    Causes constipation    12/21 ctx>>12/21 12/21 azithro>> 12/21 12/21 vanc>>12/21 12/21 linezolid>> 12/21 Unasyn >> 12/23 Bactrim>>  12/21 flu: neg 12/21 legionella: NEG 12/21 strep pneumo: neg 12/20 BCx: NGTD 12/21 mrsa pcr: neg 12/22 TA - pending 12/23 Resp panel - NEG   Salome Arnt, PharmD, BCPS Phone #: [phone] until 3pm All other times, call [place] x [phone] 11/25/2017 10:47 AM

## 2017-11-26 ENCOUNTER — Inpatient Hospital Stay (HOSPITAL_COMMUNITY): Payer: Medicare HMO

## 2017-11-26 LAB — GLUCOSE, CAPILLARY
GLUCOSE-CAPILLARY: 106 mg/dL — AB (ref 65–99)
GLUCOSE-CAPILLARY: 131 mg/dL — AB (ref 65–99)
GLUCOSE-CAPILLARY: 150 mg/dL — AB (ref 65–99)
GLUCOSE-CAPILLARY: 164 mg/dL — AB (ref 65–99)
Glucose-Capillary: 106 mg/dL — ABNORMAL HIGH (ref 65–99)
Glucose-Capillary: 98 mg/dL (ref 65–99)
Glucose-Capillary: 150 mg/dL — ABNORMAL HIGH (ref 65–99)

## 2017-11-26 LAB — ECHOCARDIOGRAM COMPLETE
AOPV: 0.74 m/s
AV Mean grad: 7 mmHg
AV pk vel: 180 cm/s
AV vel: 2.13
AVAREAMEANV: 2.38 cm2
AVAREAMEANVIN: 1.06 cm2/m2
AVAREAVTI: 2.56 cm2
AVAREAVTIIND: 0.95 cm2/m2
AVCELMEANRAT: 0.69
AVLVOTPG: 7 mmHg
AVPG: 13 mmHg
AVPHT: 420 ms
Area-P 1/2: 3.44 cm2
CHL CUP AV PEAK INDEX: 1.14
CHL CUP AV VALUE AREA INDEX: 0.95
CHL CUP STROKE VOLUME: 86 mL
DOP CAL AO MEAN VELOCITY: 126 cm/s
E decel time: 218 msec
EERAT: 21.79
FS: 18 % — AB (ref 28–44)
Height: 72 in
IV/PV OW: 0.86
LA diam end sys: 39 mm
LA diam index: 1.74 cm/m2
LA vol A4C: 93.5 ml
LA vol: 78.6 mL
LASIZE: 39 mm
LAVOLIN: 35.1 mL/m2
LV E/e' medial: 21.79
LV E/e'average: 21.79
LV TDI E'LATERAL: 5.46
LV TDI E'MEDIAL: 4.45
LV dias vol: 199 mL — AB (ref 62–150)
LV e' LATERAL: 5.46 cm/s
LV sys vol index: 50 mL/m2
LVDIAVOLIN: 89 mL/m2
LVOT SV: 86 mL
LVOT VTI: 24.9 cm
LVOT area: 3.46 cm2
LVOT peak VTI: 0.61 cm
LVOT peak vel: 133 cm/s
LVOTD: 21 mm
LVSYSVOL: 113 mL — AB (ref 21–61)
Lateral S' vel: 14.7 cm/s
MV Dec: 218
MV pk A vel: 81.5 m/s
MV pk E vel: 119 m/s
MVPG: 6 mmHg
P 1/2 time: 64 ms
PW: 14 mm — AB (ref 0.6–1.1)
RV TAPSE: 25.4 mm
Simpson's disk: 43
VTI: 143 cm
VTI: 40.5 cm
Valve area: 2.13 cm2
WEIGHTICAEL: 3425.07 [oz_av]

## 2017-11-26 LAB — POCT I-STAT 3, ART BLOOD GAS (G3+)
ACID-BASE DEFICIT: 5 mmol/L — AB (ref 0.0–2.0)
BICARBONATE: 19.8 mmol/L — AB (ref 20.0–28.0)
O2 Saturation: 98 %
PH ART: 7.348 — AB (ref 7.350–7.450)
TCO2: 21 mmol/L — AB (ref 22–32)
pCO2 arterial: 36.2 mmHg (ref 32.0–48.0)
pO2, Arterial: 112 mmHg — ABNORMAL HIGH (ref 83.0–108.0)

## 2017-11-26 LAB — BASIC METABOLIC PANEL
Anion gap: 8 (ref 5–15)
BUN: 75 mg/dL — AB (ref 6–20)
CHLORIDE: 111 mmol/L (ref 101–111)
CO2: 21 mmol/L — AB (ref 22–32)
CREATININE: 4.58 mg/dL — AB (ref 0.61–1.24)
Calcium: 8.4 mg/dL — ABNORMAL LOW (ref 8.9–10.3)
GFR calc Af Amer: 13 mL/min — ABNORMAL LOW (ref >60)
GFR calc non Af Amer: 11 mL/min — ABNORMAL LOW (ref >60)
Glucose, Bld: 128 mg/dL — ABNORMAL HIGH (ref 65–99)
POTASSIUM: 4.4 mmol/L (ref 3.5–5.1)
Sodium: 140 mmol/L (ref 135–145)

## 2017-11-26 LAB — CBC
HEMATOCRIT: 31.1 % — AB (ref 39.0–52.0)
HEMOGLOBIN: 10.1 g/dL — AB (ref 13.0–17.0)
MCH: 32.8 pg (ref 26.0–34.0)
MCHC: 32.5 g/dL (ref 30.0–36.0)
MCV: 101 fL — ABNORMAL HIGH (ref 78.0–100.0)
Platelets: 176 10*3/uL (ref 150–400)
RBC: 3.08 MIL/uL — AB (ref 4.22–5.81)
RDW: 13.9 % (ref 11.5–15.5)
WBC: 12.2 10*3/uL — ABNORMAL HIGH (ref 4.0–10.5)

## 2017-11-26 LAB — PROCALCITONIN: Procalcitonin: 35.4 ng/mL

## 2017-11-26 LAB — PHOSPHORUS: Phosphorus: 4.8 mg/dL — ABNORMAL HIGH (ref 2.5–4.6)

## 2017-11-26 LAB — MAGNESIUM: MAGNESIUM: 2.1 mg/dL (ref 1.7–2.4)

## 2017-11-26 MED ORDER — EMTRICITABINE-TENOFOVIR AF 200-25 MG PO TABS
1.0000 | ORAL_TABLET | Freq: Every day | ORAL | Status: DC
  Administered 2017-11-26 – 2017-12-01 (×6): 1
  Filled 2017-11-26 (×6): qty 1

## 2017-11-26 MED ORDER — METHYLPREDNISOLONE SODIUM SUCC 40 MG IJ SOLR
30.0000 mg | Freq: Two times a day (BID) | INTRAMUSCULAR | Status: DC
  Administered 2017-11-26 – 2017-11-28 (×5): 30 mg via INTRAVENOUS
  Filled 2017-11-26: qty 1
  Filled 2017-11-26: qty 0.75
  Filled 2017-11-26: qty 1
  Filled 2017-11-26 (×2): qty 0.75

## 2017-11-26 MED ORDER — METHYLPREDNISOLONE SODIUM SUCC 40 MG IJ SOLR: 15.0000 mg | Freq: Every day | INTRAMUSCULAR | Status: DC

## 2017-11-26 MED ORDER — METHYLPREDNISOLONE SODIUM SUCC 40 MG IJ SOLR: 30.0000 mg | Freq: Every day | INTRAMUSCULAR | Status: DC

## 2017-11-26 MED ORDER — CARVEDILOL 25 MG PO TABS
25.0000 mg | ORAL_TABLET | Freq: Two times a day (BID) | ORAL | Status: DC
  Administered 2017-11-26 – 2017-12-01 (×11): 25 mg
  Filled 2017-11-26 (×12): qty 1

## 2017-11-26 MED ORDER — ASPIRIN 81 MG PO CHEW
81.0000 mg | CHEWABLE_TABLET | Freq: Every day | ORAL | Status: DC
  Administered 2017-11-26 – 2017-12-01 (×6): 81 mg
  Filled 2017-11-26 (×7): qty 1

## 2017-11-26 NOTE — Progress Notes (Signed)
Patient ID: David [name], male   DOB: 10/09/1941, 76 y.o.   MRN: [id number]          [facility]  Date of Admission:  11/22/2017   Total days of antibiotics 5        Day 4 ampicillin sulbactam        Day 4 linezolid        Day 2 trimethoprim sulfamethoxazole ASSESSMENT: He seems to be improving on broad therapy for presumed pneumonia.  His chest x-ray shows significant improvement today.  His recent CD4 count in October was 310 in all recent viral loads have been detectable at less than 20.  Although his recent CD4 count was down to 170, pneumocystis pneumonia is quite unlikely given very good control of his HIV.  I will continue current therapy for now but will probably stop with nasal red and trimethoprim sulfamethoxazole soon.  PLAN: 1. Continue current antibiotics pending BAL results 2. Continue Descovy and Tivicay  Principal Problem:   Sepsis due to pneumonia [facility]) Active Problems:   Essential hypertension   Chronic combined systolic and diastolic heart failure (HCC)   Macrocytic anemia   CKD (chronic kidney disease) stage 4, GFR 15-29 ml/min (HCC)   AIDS (HCC)   Acute renal failure superimposed on stage 4 chronic kidney disease ([place])   Community acquired pneumonia of right lung   Aspiration pneumonia of both lower lobes due to regurgitated food ([place])   Scheduled Meds: . aspirin  81 mg Per Tube Daily  . brimonidine  1 drop Left Eye BID  . carvedilol  25 mg Per Tube BID WC  . chlorhexidine gluconate (MEDLINE KIT)  15 mL Mouth Rinse BID  . Chlorhexidine Gluconate Cloth  6 each Topical Q0600  . dolutegravir  50 mg Oral Daily  . dorzolamide-timolol  1 drop Left Eye BID  . emtricitabine-tenofovir AF  1 tablet Per Tube Daily  . heparin  5,000 Units Subcutaneous Q8H  . insulin aspart  0-15 Units Subcutaneous Q4H  . latanoprost  1 drop Left Eye QHS  . mouth rinse  15 mL Mouth Rinse QID  . methylPREDNISolone (SOLU-MEDROL) injection  30 mg  Intravenous BID   Followed by  . [START ON 11/30/2017] methylPREDNISolone (SOLU-MEDROL) injection  30 mg Intravenous Daily   Followed by  . [START ON 12/05/2017] methylPREDNISolone (SOLU-MEDROL) injection  15.2 mg Intravenous Daily  . pantoprazole (PROTONIX) IV  40 mg Intravenous Q24H  . sodium chloride flush  3 mL Intravenous Q12H   Continuous Infusions: . sodium chloride 250 mL (11/25/17 2000)  . sodium chloride    . ampicillin-sulbactam (UNASYN) IV 3 g (11/26/17 1497)  . dexmedetomidine 0.4 mcg/kg/hr (11/26/17 0840)  . linezolid (ZYVOX) IV Stopped (11/26/17 1223)  . sulfamethoxazole-trimethoprim Stopped (11/26/17 0145)   PRN Meds:.sodium chloride, sodium chloride, fentaNYL (SUBLIMAZE) injection, fentaNYL (SUBLIMAZE) injection, hydrALAZINE, sodium chloride flush  Review of Systems: Review of Systems  Unable to perform ROS: Intubated    Allergies  Allergen Reactions  . Codeine Other (See Comments)    Causes constipation  . Cozaar [Losartan Potassium] Other (See Comments)    Causes constipation     OBJECTIVE: Vitals:   11/26/17 1139 11/26/17 1144 11/26/17 1200 11/26/17 1209  BP:   131/78 131/78  Pulse:  81 81 75  Resp:  17 20   Temp: 99.5 F (37.5 C)     TempSrc: Oral     SpO2:   100%   Weight:  Height:       Body mass index is 29.93 kg/m.  Physical Exam  Constitutional:  He is alert.  He remains intubated.  His wife is at the bedside.  Cardiovascular: Normal rate and regular rhythm.  No murmur heard. Pulmonary/Chest: He has no wheezes. He has no rales.  Coarse breath sounds bilaterally.  Abdominal: Soft. He exhibits no distension. There is no tenderness.  Musculoskeletal: Normal range of motion. He exhibits no edema or tenderness.  Neurological: He is alert.  Skin: No rash noted.  Psychiatric: Mood and affect normal.    Lab Results Lab Results  Component Value Date   WBC 12.2 (H) 11/26/2017   HGB 10.1 (L) 11/26/2017   HCT 31.1 (L) 11/26/2017    MCV 101.0 (H) 11/26/2017   PLT 176 11/26/2017    Lab Results  Component Value Date   CREATININE 4.58 (H) 11/26/2017   BUN 75 (H) 11/26/2017   NA 140 11/26/2017   K 4.4 11/26/2017   CL 111 11/26/2017   CO2 21 (L) 11/26/2017    Lab Results  Component Value Date   ALT 20 11/22/2017   AST 22 11/22/2017   ALKPHOS 104 11/22/2017   BILITOT 0.6 11/22/2017     Microbiology: Recent Results (from the past 240 hour(s))  Blood culture (routine x 2)     Status: None (Preliminary result)   Collection Time: 11/22/17 11:40 PM  Result Value Ref Range Status   Specimen Description BLOOD RIGHT HAND  Final   Special Requests   Final    BOTTLES DRAWN AEROBIC AND ANAEROBIC Blood Culture adequate volume   Culture NO GROWTH 3 DAYS  Final   Report Status PENDING  Incomplete  MRSA PCR Screening     Status: None   Collection Time: 11/23/17  6:41 AM  Result Value Ref Range Status   MRSA by PCR NEGATIVE NEGATIVE Final    Comment:        The GeneXpert MRSA Assay (FDA approved for NASAL specimens only), is one component of a comprehensive MRSA colonization surveillance program. It is not intended to diagnose MRSA infection nor to guide or monitor treatment for MRSA infections.   Culture, blood (single)     Status: None (Preliminary result)   Collection Time: 11/24/17  8:45 PM  Result Value Ref Range Status   Specimen Description BLOOD CENTRAL LINE  Final   Special Requests   Final    IN PEDIATRIC BOTTLE Blood Culture results may not be optimal due to an excessive volume of blood received in culture bottles   Culture NO GROWTH 2 DAYS  Final   Report Status PENDING  Incomplete  Culture, respiratory (NON-Expectorated)     Status: None (Preliminary result)   Collection Time: 11/24/17  9:00 PM  Result Value Ref Range Status   Specimen Description TRACHEAL ASPIRATE  Final   Special Requests NONE  Final   Gram Stain   Final    MODERATE WBC PRESENT,BOTH PMN AND MONONUCLEAR NO SQUAMOUS EPITHELIAL  CELLS SEEN RARE BUDDING YEAST SEEN    Culture RARE Consistent with normal respiratory flora.  Final   Report Status PENDING  Incomplete  Respiratory Panel by PCR     Status: None   Collection Time: 11/25/17  2:09 AM  Result Value Ref Range Status   Adenovirus NOT DETECTED NOT DETECTED Final   Coronavirus 229E NOT DETECTED NOT DETECTED Final   Coronavirus HKU1 NOT DETECTED NOT DETECTED Final   Coronavirus NL63 NOT DETECTED NOT DETECTED  Final   Coronavirus OC43 NOT DETECTED NOT DETECTED Final   Metapneumovirus NOT DETECTED NOT DETECTED Final   Rhinovirus / Enterovirus NOT DETECTED NOT DETECTED Final   Influenza A NOT DETECTED NOT DETECTED Final   Influenza B NOT DETECTED NOT DETECTED Final   Parainfluenza Virus 1 NOT DETECTED NOT DETECTED Final   Parainfluenza Virus 2 NOT DETECTED NOT DETECTED Final   Parainfluenza Virus 3 NOT DETECTED NOT DETECTED Final   Parainfluenza Virus 4 NOT DETECTED NOT DETECTED Final   Respiratory Syncytial Virus NOT DETECTED NOT DETECTED Final   Bordetella pertussis NOT DETECTED NOT DETECTED Final   Chlamydophila pneumoniae NOT DETECTED NOT DETECTED Final   Mycoplasma pneumoniae NOT DETECTED NOT DETECTED Final  Culture, bal-quantitative     Status: None (Preliminary result)   Collection Time: 11/25/17 11:33 AM  Result Value Ref Range Status   Specimen Description BRONCHIAL ALVEOLAR LAVAGE  Final   Special Requests Immunocompromised  Final   Gram Stain   Final    ABUNDANT WBC PRESENT,BOTH PMN AND MONONUCLEAR NO SQUAMOUS EPITHELIAL CELLS SEEN NO ORGANISMS SEEN    Culture NO GROWTH < 24 HOURS  Final   Report Status PENDING  Incomplete    Michel Bickers, MD [place] [facility] [phone] pager   [phone] cell 11/26/2017, 12:33 PM

## 2017-11-26 NOTE — Progress Notes (Signed)
Patient was intubated but alert. Family, wife on site. Patient seemed to be in lots of pain due to Pneumonia.  Annell Canty a [place], Chaplain    11/26/17 1400  Clinical Encounter Type  Visited With Patient and family together  Visit Type Initial  Referral From Nurse  Consult/Referral To Chaplain  Spiritual Encounters  Spiritual Needs Prayer  Stress Factors  Patient Stress Factors Health changes  . I encouraged patient and wife.

## 2017-11-26 NOTE — Procedures (Signed)
Extubation Procedure Note  Patient Details:   Name: CLEMON DEVAUL DOB: 1941-02-13 MRN: [id number]   Airway Documentation:  Airway 5 mm (Active)    Evaluation  O2 sats: stable throughout Complications: No apparent complications Patient did tolerate procedure well. Bilateral Breath Sounds: Clear   Yes   Patient extubated without any complications. Patient has an adequate cough and is able to speak clearly. Will continue to monitor.  Blanchie Serve 11/26/2017, 3:11 PM

## 2017-11-26 NOTE — Progress Notes (Addendum)
PULMONARY / CRITICAL CARE MEDICINE   Name: David [name] MRN: [id number] DOB: [birth year]    ADMISSION DATE:  11/22/2017 CONSULTATION DATE:  11/24/2017  REFERRING MD:  Dr. Tana Coast  CHIEF COMPLAINT:  Dyspnea   HISTORY OF PRESENT ILLNESS:  Information obtained via medical record as patient is patient is intubated and sedated   76 year old male with PMH of Anemia, CKD stage IV, HIV (CD4 310 and VL undetectable in October), HLD, HTN, Chronic mixed systolic/diastolic HF (EF 82-64, B5AX with severely reduced systolic dysfunction), prostate CA s/p brachytherapy    Presents to ED on 12/21 with progressive dyspnea with productive cough. CXR with extensive consolidation of right middle and lower lobes as well as left base, concerning for multifocal PNA. Given Azithromycin and Rocephin, placed on BiPAP and admitted to step-down.   On 12/22 patient became progressively hypoxic with increased work of breathing. CXR with persistent infiltrates. Given Lasix 40 x 1, Solu-medrol 125 mg x 1, morphine 0.5 mg x 1. ABG 7.2/56/89. Intubated and transferred to ICU.    PAST MEDICAL HISTORY :  He  has a past medical history of Anemia, iron deficiency (11/15/2011), Arthritis, Cancer ([place]), Chronic kidney disease, Glaucoma, Heart murmur, History of cardiac cath (2004), History of myocardial perfusion scan (02/2010), HIV infection ([place]), Hyperlipidemia, Hypertension, Nonischemic cardiomyopathy ([place]), Sinus bradycardia, Stroke ([place]), and Systolic and diastolic CHF, acute ([place]) (06/2010).  PAST SURGICAL HISTORY: He  has a past surgical history that includes Radioactive seed implant (2010); Cardiac catheterization (02/04/2003); US ECHOCARDIOGRAPHY (02/06/2012); NM MYOCAR PERF WALL MOTION (02/21/2010); Esophagogastroduodenoscopy (egd) with propofol (N/A, 05/05/2014); Eye surgery (Bilateral); Eye surgery (Bilateral); Colonoscopy; Bascilic vein transposition (Left, 06/24/2015); and AV fistula placement (Right, 10/13/2016).  Allergies   Allergen Reactions  . Codeine Other (See Comments)    Causes constipation  . Cozaar [Losartan Potassium] Other (See Comments)    Causes constipation     No current facility-administered medications on file prior to encounter.    Current Outpatient Medications on File Prior to Encounter  Medication Sig  . acetaminophen (TYLENOL) 500 MG tablet Take 500 mg by mouth every 4 (four) hours as needed (for muscle pain).   Marland Kitchen amLODipine (NORVASC) 10 MG tablet TAKE 1 TABLET EVERY DAY (Patient taking differently: Take 10 mg by mouth once a day)  . aspirin 325 MG EC tablet Take 325 mg by mouth every evening.   . brimonidine (ALPHAGAN) 0.15 % ophthalmic solution Place 1 drop into the left eye 2 (two) times daily.   . dolutegravir (TIVICAY) 50 MG tablet Take 1 tablet (50 mg total) by mouth daily.  . dorzolamide-timolol (COSOPT) 22.3-6.8 MG/ML ophthalmic solution Place 1 drop into the left eye 2 (two) times daily.   Marland Kitchen emtricitabine-tenofovir AF (DESCOVY) 200-25 MG tablet Take 1 tablet by mouth daily.  . furosemide (LASIX) 40 MG tablet Take 2 tablets (80 mg total) by mouth daily. (Patient taking differently: Take 80 mg by mouth every morning. )  . latanoprost (XALATAN) 0.005 % ophthalmic solution Place 1 drop into the left eye at bedtime.   . ranitidine (ZANTAC) 150 MG tablet Take 1 tablet (150 mg total) by mouth 2 (two) times daily.  . Vitamin D, Ergocalciferol, (DRISDOL) 50000 UNITS CAPS capsule Take 50,000 units by mouth once a week on Saturdays  . carvedilol (COREG) 25 MG tablet TAKE 1 TABLET TWICE DAILY (Patient taking differently: Take 25 mg by mouth two times a day)    FAMILY HISTORY:  His indicated that his mother is deceased.  He indicated that his father is deceased. He indicated that the status of his maternal grandmother is unknown. He indicated that his daughter is alive. He indicated that his son is alive. He indicated that the status of his neg hx is unknown.   SOCIAL HISTORY: He   reports that he quit smoking about 11 years ago. he has never used smokeless tobacco. He reports that he drinks alcohol. He reports that he does not use drugs.  REVIEW OF SYSTEMS:   Unable to review as patient is intubated and sedated   SUBJECTIVE:  On vent, weaning S/p bronch yesterday  VITAL SIGNS: BP 123/78   Pulse 81   Temp 99.2 F (37.3 C) (Oral)   Resp 18   Ht 6' (1.829 m)   Wt 220 lb 10.9 oz (100.1 kg)   SpO2 100%   BMI 29.93 kg/m   HEMODYNAMICS: CVP:  [8 mmHg-12 mmHg] 12 mmHg  VENTILATOR SETTINGS: Vent Mode: CPAP;PSV FiO2 (%):  [40 %] 40 % PEEP:  [5 cmH20] 5 cmH20 Pressure Support:  [10 cmH20-16 cmH20] 10 cmH20 Plateau Pressure:  [20 cmH20-22 cmH20] 20 cmH20  INTAKE / OUTPUT: I/O last 3 completed shifts: In: 2023.8 [I.V.:693.4; IV Piggyback:1330.3] Out: 1350 [Urine:1350]  PHYSICAL EXAMINATION Gen:      No acute distress HEENT:  EOMI, sclera anicteric, ETT in place Neck:     No masses; no thyromegaly Lungs:    Clear to auscultation bilaterally; normal respiratory effort CV:         Regular rate and rhythm; no murmurs Abd:      + bowel sounds; soft, non-tender; no palpable masses, no distension Ext:    No edema; adequate peripheral perfusion Skin:      Warm and dry; no rash Neuro: Awake, responds to commands  LABS:  BMET Recent Labs  Lab 11/25/17 0009 11/25/17 0611 11/26/17 0610  NA 136 139 140  K 4.5 4.7 4.4  CL 105 109 111  CO2 21* 21* 21*  BUN 69* 72* 75*  CREATININE 4.64* 4.76* 4.58*  GLUCOSE 177* 171* 128*    Electrolytes Recent Labs  Lab 11/25/17 0009 11/25/17 0611 11/26/17 0610  CALCIUM 8.1* 8.3* 8.4*  MG  --  2.0 2.1  PHOS  --  5.3* 4.8*    CBC Recent Labs  Lab 11/24/17 0335 11/25/17 0611 11/26/17 0610  WBC 13.9* 11.2* 12.2*  HGB 10.8* 11.0* 10.1*  HCT 32.8* 34.3* 31.1*  PLT 210 178 176    Coag's No results for input(s): APTT, INR in the last 168 hours.  Sepsis Markers Recent Labs  Lab 11/22/17 2230  11/24/17 2045 11/25/17 0611 11/26/17 0610  LATICACIDVEN 1.05 1.4  --   --   PROCALCITON  --  10.39 32.44 35.40    ABG Recent Labs  Lab 11/24/17 1816 11/24/17 2115 11/26/17 0436  PHART 7.225* 7.341* 7.348*  PCO2ART 56.7* 38.1 36.2  PO2ART 69.4* 71.0* 112.0*    Liver Enzymes Recent Labs  Lab 11/22/17 2224  AST 22  ALT 20  ALKPHOS 104  BILITOT 0.6  ALBUMIN 3.7    Cardiac Enzymes Recent Labs  Lab 11/25/17 0611 11/25/17 0816 11/25/17 1950  TROPONINI 0.07* 0.07* 0.05*    Glucose Recent Labs  Lab 11/25/17 1541 11/25/17 1943 11/26/17 0007 11/26/17 0341 11/26/17 0743 11/26/17 1134  GLUCAP 131* 133* 150* 106* 106* 98    Imaging Dg Chest 1 View  Result Date: 11/25/2017 CLINICAL DATA:  Post bronchoscopy.  Follow-up pneumonia. EXAM: Portable CHEST 1 VIEW  COMPARISON:  CT chest earlier same day and 04/29/2014. Chest x-rays yesterday, 11/23/2017 and earlier. FINDINGS: Endotracheal tube tip in satisfactory position approximately 4 cm above the carina. Right jugular central venous catheter tip projects over the upper SVC, unchanged. Nasogastric tube courses below the diaphragm into the stomach. Cardiac silhouette markedly enlarged, unchanged. No evidence of pneumothorax or pneumomediastinum. Airspace consolidation throughout both lungs, most confluent in the left lower lobe. Slight improved aeration in the right lung with worsening aeration in the left lower lobe since yesterday. Mild pulmonary venous hypertension without overt edema. IMPRESSION: 1.  Support apparatus satisfactory. 2. No evidence of pneumothorax or pneumomediastinum post bronchoscopy. 3. Multilobar bilateral pneumonia. Slight improved aeration in the right lung since yesterday. Worsening consolidation in the left lower lobe. Electronically Signed   By: Evangeline Dakin M.D.   On: 11/25/2017 11:54   Dg Abd 1 View  Result Date: 11/26/2017 CLINICAL DATA:  Orogastric tube placement EXAM: ABDOMEN - 1 VIEW  COMPARISON:  CT abdomen and pelvis Apr 29, 2014 FINDINGS: Orogastric tube tip is in the stomach with the side port at the gastroesophageal junction. Visualized bowel gas pattern is unremarkable. No bowel obstruction or free air evident. Visualized lung bases are clear. Heart appears enlarged peer IMPRESSION: No gastric tube tip is in the stomach with side port at gastroesophageal junction. Advise advancing tube approximately 5 to 6 cm to insure that both the tube and side port are well within the stomach. No evident bowel obstruction or free air.  Heart appears prominent. Electronically Signed   By: Lowella Grip III M.D.   On: 11/26/2017 11:23   Dg Chest Port 1 View  Result Date: 11/26/2017 CLINICAL DATA:  Hypoxia EXAM: PORTABLE CHEST 1 VIEW COMPARISON:  November 25, 2017 FINDINGS: Endotracheal tube tip is 4.3 cm above the carina. Central catheter tip is in superior vena cava. Nasogastric tube tip and side port below the diaphragm. No pneumothorax evident. Layering pleural effusions are noted. There is atelectatic change in the bases. There is no frank edema or consolidation. There is cardiomegaly with pulmonary vascularity within normal limits. No adenopathy. No bone lesions. IMPRESSION: Tube and catheter positions as described without evident pneumothorax. Cardiomegaly, stable. Clearing pleural effusions. No edema or consolidation. Electronically Signed   By: Lowella Grip III M.D.   On: 11/26/2017 07:04   STUDIES:  CXR 12/21 > Decreased right upper and bilateral lower lobe pulmonary infiltrates or edema. 2. Cardiomegaly with vascular congestion. CXR 12/22 > Possible slight interval worsening of multifocal airspace process most involving the right lung likely multifocal pneumonia CT chest 12/23 > multifocal consolidation consistent with pneumonia.  CULTURES: Blood 12/20 >> MRSA PCR 12/21 > Negative  Step Pneumo 12/21 > Negative  L. Pneumo 12/21 > Negative  Blood 12/22 >> Sputum 12/22  >>   ANTIBIOTICS: Unasyn 12/21 >> Azithromycin 12/20 > 12/21 Linezolid 12/21 >> Bactrim 12/23 >  SIGNIFICANT EVENTS: 12/21 > Presents to ED   LINES/TUBES: ETT 12/22 >> RIJ CVC 12/22 >>  DISCUSSION: 76 year old male presents to ED with multilobar PNA. Admitted to step-down. On 12/22 required intubation.   ASSESSMENT / PLAN:  PULMONARY A: Acute Hypoxic Respiratory Failure in setting of multifocal PNA +Acute on Chronic Heart Failure   P:   Continue vent support Start SBTs Follow BAL results  CARDIOVASCULAR A:  Acute on Chronic Systolic/Diasoltic HF in setting of nonischemic cardiomyopathy  08/2017 EF 25-30, G1DD with severely reduced systolic function  Syncopal Events starting August 2018 H/O HTN -  Follows [name] in office  P:  Cardiac Monitoring  Continue ASA Hydralazine PRN for systolic >975  Restart Coreg Holding Norvasc  RENAL A:   Acute on Chronic KD IV (Bas Crt 3.6-3.8)  LA > 1.4  S/P Basilic Vein Fistula in which has been malfunctioning  -Follows Dr. Posey Pronto in Office  P:   Trend BMP  Replace electrolytes as indicated  Avoid Nephrotoxins  No Indication for HD at this time  GASTROINTESTINAL A:   SUP  P:   NPO PPI   HEMATOLOGIC A:   Macrocytic Anemia  B12 > 655 Folate >  P:  Trend CBC   INFECTIOUS A:   Multifocal PNA  Aspiration Event  HIV/AIDS -Flu negative, MRSA Negative, Step P negative, Legionella negative  -PCT 10.39  P:   Currently on Unasyn, Zyvox and bactrim per ID Waiting for PJP results on BAL Continue Tivicay, Descovy   Check urine legionella, pneumococcus  ENDOCRINE A:   Hyperglycemia, on steroids P:   Trend Glucose  SSI coverage  NEUROLOGIC A:   Acute Metabolic Encephalopathy  P:   RASS goal: 0/-1 Wean Precedex to achieve RASS PRN Fentanyl   FAMILY  - Updates: Pt and family updated at bedside.  - Inter-disciplinary family meet or Palliative Care meeting due by: 12/01/2017  The patient is critically  ill with multiple organ system failure and requires high complexity decision making for assessment and support, frequent evaluation and titration of therapies, advanced monitoring, review of radiographic studies and interpretation of complex data.   Critical Care Time devoted to patient care services, exclusive of separately billable procedures, described in this note is 35 minutes.   Marshell Garfinkel MD [facility] Pager [phone] If no answer or after 3pm call: [phone] 11/26/2017, 11:38 AM

## 2017-11-27 ENCOUNTER — Inpatient Hospital Stay (HOSPITAL_COMMUNITY): Payer: Medicare HMO

## 2017-11-27 LAB — CBC
HEMATOCRIT: 33.7 % — AB (ref 39.0–52.0)
HEMOGLOBIN: 11 g/dL — AB (ref 13.0–17.0)
MCH: 33.1 pg (ref 26.0–34.0)
MCHC: 32.6 g/dL (ref 30.0–36.0)
MCV: 101.5 fL — AB (ref 78.0–100.0)
Platelets: 214 10*3/uL (ref 150–400)
RBC: 3.32 MIL/uL — AB (ref 4.22–5.81)
RDW: 14.4 % (ref 11.5–15.5)
WBC: 13.7 10*3/uL — AB (ref 4.0–10.5)

## 2017-11-27 LAB — BASIC METABOLIC PANEL
ANION GAP: 9 (ref 5–15)
BUN: 70 mg/dL — ABNORMAL HIGH (ref 6–20)
CO2: 21 mmol/L — AB (ref 22–32)
Calcium: 8.6 mg/dL — ABNORMAL LOW (ref 8.9–10.3)
Chloride: 110 mmol/L (ref 101–111)
Creatinine, Ser: 4.59 mg/dL — ABNORMAL HIGH (ref 0.61–1.24)
GFR calc non Af Amer: 11 mL/min — ABNORMAL LOW (ref >60)
GFR, EST AFRICAN AMERICAN: 13 mL/min — AB (ref >60)
GLUCOSE: 133 mg/dL — AB (ref 65–99)
Potassium: 4.4 mmol/L (ref 3.5–5.1)
Sodium: 140 mmol/L (ref 135–145)

## 2017-11-27 LAB — CULTURE, RESPIRATORY W GRAM STAIN: Culture: NORMAL

## 2017-11-27 LAB — TROPONIN I
TROPONIN I: 0.08 ng/mL — AB (ref <0.03)
TROPONIN I: 0.09 ng/mL — AB (ref <0.03)

## 2017-11-27 LAB — GLUCOSE, CAPILLARY
GLUCOSE-CAPILLARY: 107 mg/dL — AB (ref 65–99)
Glucose-Capillary: 121 mg/dL — ABNORMAL HIGH (ref 65–99)
Glucose-Capillary: 142 mg/dL — ABNORMAL HIGH (ref 65–99)
Glucose-Capillary: 128 mg/dL — ABNORMAL HIGH (ref 65–99)
Glucose-Capillary: 125 mg/dL — ABNORMAL HIGH (ref 65–99)
Glucose-Capillary: 114 mg/dL — ABNORMAL HIGH (ref 65–99)

## 2017-11-27 LAB — CULTURE, BAL-QUANTITATIVE W GRAM STAIN: Culture: NO GROWTH

## 2017-11-27 LAB — CULTURE, RESPIRATORY

## 2017-11-27 LAB — PHOSPHORUS: Phosphorus: 4.6 mg/dL (ref 2.5–4.6)

## 2017-11-27 LAB — MAGNESIUM: Magnesium: 2.2 mg/dL (ref 1.7–2.4)

## 2017-11-27 MED ORDER — AMLODIPINE BESYLATE 10 MG PO TABS
10.0000 mg | ORAL_TABLET | Freq: Every day | ORAL | Status: DC
  Administered 2017-11-27 – 2017-12-01 (×5): 10 mg via ORAL
  Filled 2017-11-27 (×5): qty 1

## 2017-11-27 MED ORDER — FUROSEMIDE 10 MG/ML IJ SOLN
40.0000 mg | Freq: Two times a day (BID) | INTRAMUSCULAR | Status: DC
  Administered 2017-11-27 – 2017-11-30 (×7): 40 mg via INTRAVENOUS
  Filled 2017-11-27 (×7): qty 4

## 2017-11-27 MED ORDER — FUROSEMIDE 10 MG/ML IJ SOLN
INTRAMUSCULAR | Status: AC
  Administered 2017-11-27: 60 mg via INTRAVENOUS
  Filled 2017-11-27: qty 8

## 2017-11-27 MED ORDER — FUROSEMIDE 10 MG/ML IJ SOLN
60.0000 mg | Freq: Once | INTRAMUSCULAR | Status: AC
  Administered 2017-11-27: 60 mg via INTRAVENOUS

## 2017-11-27 NOTE — Progress Notes (Signed)
Patient ID: David [name], male   DOB: 03/19/1941, 76 y.o.   MRN: [id number]          [facility]  Date of Admission:  11/22/2017   Total days of antibiotics 6        Day 5 ampicillin sulbactam        Day 5 linezolid        Day 3 trimethoprim sulfamethoxazole ASSESSMENT: He is improving on broad empiric therapy for community-acquired pneumonia of unknown cause.  He is not at high risk for MRSA so I will stop linezolid.  His HIV infection has been under excellent control.  With a CD4 count of 170 and an undetectable viral load, pneumocystis pneumonia is extremely unlikely.  I will stop trimethoprim sulfamethoxazole now and plan on 2 more days of ampicillin sulbactam.  PLAN: 1. Continue ampicillin sulbactam 2. Discontinue linezolid and trimethoprim sulfamethoxazole 3. Continue Descovy and Tivicay  Principal Problem:   Sepsis due to pneumonia [name]) Active Problems:   Essential hypertension   Chronic combined systolic and diastolic heart failure (HCC)   Macrocytic anemia   CKD (chronic kidney disease) stage 4, GFR 15-29 ml/min (HCC)   AIDS (HCC)   Acute renal failure superimposed on stage 4 chronic kidney disease ([place])   Community acquired pneumonia of right lung   Aspiration pneumonia of both lower lobes due to regurgitated food ([place])   Scheduled Meds: . amLODipine  10 mg Oral Daily  . aspirin  81 mg Per Tube Daily  . brimonidine  1 drop Left Eye BID  . carvedilol  25 mg Per Tube BID WC  . chlorhexidine gluconate (MEDLINE KIT)  15 mL Mouth Rinse BID  . Chlorhexidine Gluconate Cloth  6 each Topical Q0600  . dolutegravir  50 mg Oral Daily  . dorzolamide-timolol  1 drop Left Eye BID  . emtricitabine-tenofovir AF  1 tablet Per Tube Daily  . furosemide  40 mg Intravenous Q12H  . heparin  5,000 Units Subcutaneous Q8H  . insulin aspart  0-15 Units Subcutaneous Q4H  . latanoprost  1 drop Left Eye QHS  . mouth rinse  15 mL Mouth Rinse QID  .  methylPREDNISolone (SOLU-MEDROL) injection  30 mg Intravenous BID   Followed by  . [START ON 11/30/2017] methylPREDNISolone (SOLU-MEDROL) injection  30 mg Intravenous Daily   Followed by  . [START ON 12/05/2017] methylPREDNISolone (SOLU-MEDROL) injection  15.2 mg Intravenous Daily  . pantoprazole (PROTONIX) IV  40 mg Intravenous Q24H  . sodium chloride flush  3 mL Intravenous Q12H   Continuous Infusions: . sodium chloride 250 mL (11/26/17 1900)  . sodium chloride    . ampicillin-sulbactam (UNASYN) IV Stopped (11/27/17 6644)  . dexmedetomidine Stopped (11/26/17 1515)  . linezolid (ZYVOX) IV 600 mg (11/27/17 1340)  . sulfamethoxazole-trimethoprim Stopped (11/27/17 1045)   PRN Meds:.sodium chloride, sodium chloride, fentaNYL (SUBLIMAZE) injection, fentaNYL (SUBLIMAZE) injection, hydrALAZINE, sodium chloride flush   SUBJECTIVE: He is now extubated and feeling much better.  He recalls having very rapid onset of shortness of breath shortly before admission.  He started coughing and thought that he coughed up some blood.  He has never had anything like that before.  Review of Systems: Review of Systems  Constitutional: Negative for chills, diaphoresis, fever and weight loss.  Respiratory: Positive for cough, hemoptysis, sputum production and shortness of breath.   Cardiovascular: Negative for chest pain.  Gastrointestinal: Negative for abdominal pain, diarrhea, nausea and vomiting.    Allergies  Allergen  Reactions  . Codeine Other (See Comments)    Causes constipation  . Cozaar [Losartan Potassium] Other (See Comments)    Causes constipation     OBJECTIVE: Vitals:   11/27/17 1100 11/27/17 1200 11/27/17 1208 11/27/17 1300  BP: (!) 153/88 (!) 155/85  (!) 153/93  Pulse: 87 86  90  Resp: (!) 21 (!) 21  (!) 23  Temp:   98.2 F (36.8 C)   TempSrc:   Oral   SpO2: 97% 96%  99%  Weight:      Height:       Body mass index is 29.09 kg/m.  Physical Exam  Constitutional: He is  oriented to person, place, and time.  He is resting quietly in bed.  He appears comfortable.  HENT:  Mouth/Throat: No oropharyngeal exudate.  Cardiovascular: Normal rate and regular rhythm.  No murmur heard. Pulmonary/Chest: Effort normal and breath sounds normal. He has no wheezes. He has no rales.  Neurological: He is alert and oriented to person, place, and time.  Psychiatric: Mood and affect normal.    Lab Results Lab Results  Component Value Date   WBC 13.7 (H) 11/27/2017   HGB 11.0 (L) 11/27/2017   HCT 33.7 (L) 11/27/2017   MCV 101.5 (H) 11/27/2017   PLT 214 11/27/2017    Lab Results  Component Value Date   CREATININE 4.59 (H) 11/27/2017   BUN 70 (H) 11/27/2017   NA 140 11/27/2017   K 4.4 11/27/2017   CL 110 11/27/2017   CO2 21 (L) 11/27/2017    Lab Results  Component Value Date   ALT 20 11/22/2017   AST 22 11/22/2017   ALKPHOS 104 11/22/2017   BILITOT 0.6 11/22/2017    HIV 1 RNA Quant (copies/mL)  Date Value  11/22/2017 <20  09/19/2017 <20 NOT DETECTED  08/03/2017 56 (H)   CD4 T Cell Abs (/uL)  Date Value  11/22/2017 170 (L)  09/19/2017 310 (L)  08/03/2017 350 (L)   Microbiology: Recent Results (from the past 240 hour(s))  Blood culture (routine x 2)     Status: None (Preliminary result)   Collection Time: 11/22/17 11:40 PM  Result Value Ref Range Status   Specimen Description BLOOD RIGHT HAND  Final   Special Requests   Final    BOTTLES DRAWN AEROBIC AND ANAEROBIC Blood Culture adequate volume   Culture NO GROWTH 4 DAYS  Final   Report Status PENDING  Incomplete  MRSA PCR Screening     Status: None   Collection Time: 11/23/17  6:41 AM  Result Value Ref Range Status   MRSA by PCR NEGATIVE NEGATIVE Final    Comment:        The GeneXpert MRSA Assay (FDA approved for NASAL specimens only), is one component of a comprehensive MRSA colonization surveillance program. It is not intended to diagnose MRSA infection nor to guide or monitor treatment  for MRSA infections.   Culture, blood (single)     Status: None (Preliminary result)   Collection Time: 11/24/17  8:45 PM  Result Value Ref Range Status   Specimen Description BLOOD CENTRAL LINE  Final   Special Requests   Final    IN PEDIATRIC BOTTLE Blood Culture results may not be optimal due to an excessive volume of blood received in culture bottles   Culture NO GROWTH 3 DAYS  Final   Report Status PENDING  Incomplete  Culture, respiratory (NON-Expectorated)     Status: None   Collection Time: 11/24/17  9:00  PM  Result Value Ref Range Status   Specimen Description TRACHEAL ASPIRATE  Final   Special Requests NONE  Final   Gram Stain   Final    MODERATE WBC PRESENT,BOTH PMN AND MONONUCLEAR NO SQUAMOUS EPITHELIAL CELLS SEEN RARE BUDDING YEAST SEEN    Culture RARE Consistent with normal respiratory flora.  Final   Report Status 11/27/2017 FINAL  Final  Respiratory Panel by PCR     Status: None   Collection Time: 11/25/17  2:09 AM  Result Value Ref Range Status   Adenovirus NOT DETECTED NOT DETECTED Final   Coronavirus 229E NOT DETECTED NOT DETECTED Final   Coronavirus HKU1 NOT DETECTED NOT DETECTED Final   Coronavirus NL63 NOT DETECTED NOT DETECTED Final   Coronavirus OC43 NOT DETECTED NOT DETECTED Final   Metapneumovirus NOT DETECTED NOT DETECTED Final   Rhinovirus / Enterovirus NOT DETECTED NOT DETECTED Final   Influenza A NOT DETECTED NOT DETECTED Final   Influenza B NOT DETECTED NOT DETECTED Final   Parainfluenza Virus 1 NOT DETECTED NOT DETECTED Final   Parainfluenza Virus 2 NOT DETECTED NOT DETECTED Final   Parainfluenza Virus 3 NOT DETECTED NOT DETECTED Final   Parainfluenza Virus 4 NOT DETECTED NOT DETECTED Final   Respiratory Syncytial Virus NOT DETECTED NOT DETECTED Final   Bordetella pertussis NOT DETECTED NOT DETECTED Final   Chlamydophila pneumoniae NOT DETECTED NOT DETECTED Final   Mycoplasma pneumoniae NOT DETECTED NOT DETECTED Final  Culture,  bal-quantitative     Status: None (Preliminary result)   Collection Time: 11/25/17 11:33 AM  Result Value Ref Range Status   Specimen Description BRONCHIAL ALVEOLAR LAVAGE  Final   Special Requests Immunocompromised  Final   Gram Stain   Final    ABUNDANT WBC PRESENT,BOTH PMN AND MONONUCLEAR NO SQUAMOUS EPITHELIAL CELLS SEEN NO ORGANISMS SEEN    Culture NO GROWTH 2 DAYS  Final   Report Status PENDING  Incomplete    Michel Bickers, MD [facility] [phone] pager   [phone] cell 11/27/2017, 2:28 PM

## 2017-11-27 NOTE — Progress Notes (Addendum)
PULMONARY / CRITICAL CARE MEDICINE   Name: David [name] MRN: [id number] DOB: [birth year]    ADMISSION DATE:  11/22/2017 CONSULTATION DATE:  11/24/2017  REFERRING MD:  Dr. Tana Coast  CHIEF COMPLAINT:  Dyspnea   HISTORY OF PRESENT ILLNESS:  Information obtained via medical record as patient is patient is intubated and sedated   76 year old male with PMH of Anemia, CKD stage IV, HIV (CD4 310 and VL undetectable in October), HLD, HTN, Chronic mixed systolic/diastolic HF (EF 67-67, M0NO with severely reduced systolic dysfunction), prostate CA s/p brachytherapy    Presents to ED on 12/21 with progressive dyspnea with productive cough. CXR with extensive consolidation of right middle and lower lobes as well as left base, concerning for multifocal PNA. Given Azithromycin and Rocephin, placed on BiPAP and admitted to step-down.   On 12/22 patient became progressively hypoxic with increased work of breathing. CXR with persistent infiltrates. Given Lasix 40 x 1, Solu-medrol 125 mg x 1, morphine 0.5 mg x 1. ABG 7.2/56/89. Intubated and transferred to ICU.    PAST MEDICAL HISTORY :  He  has a past medical history of Anemia, iron deficiency (11/15/2011), Arthritis, Cancer ([name]), Chronic kidney disease, Glaucoma, Heart murmur, History of cardiac cath (2004), History of myocardial perfusion scan (02/2010), HIV infection ([name]), Hyperlipidemia, Hypertension, Nonischemic cardiomyopathy ([name]), Sinus bradycardia, Stroke ([name]), and Systolic and diastolic CHF, acute ([name]) (06/2010).  PAST SURGICAL HISTORY: He  has a past surgical history that includes Radioactive seed implant (2010); Cardiac catheterization (02/04/2003); US ECHOCARDIOGRAPHY (02/06/2012); NM MYOCAR PERF WALL MOTION (02/21/2010); Esophagogastroduodenoscopy (egd) with propofol (N/A, 05/05/2014); Eye surgery (Bilateral); Eye surgery (Bilateral); Colonoscopy; Bascilic vein transposition (Left, 06/24/2015); and AV fistula placement (Right, 10/13/2016).  Allergies   Allergen Reactions  . Codeine Other (See Comments)    Causes constipation  . Cozaar [Losartan Potassium] Other (See Comments)    Causes constipation     No current facility-administered medications on file prior to encounter.    Current Outpatient Medications on File Prior to Encounter  Medication Sig  . acetaminophen (TYLENOL) 500 MG tablet Take 500 mg by mouth every 4 (four) hours as needed (for muscle pain).   Marland Kitchen amLODipine (NORVASC) 10 MG tablet TAKE 1 TABLET EVERY DAY (Patient taking differently: Take 10 mg by mouth once a day)  . aspirin 325 MG EC tablet Take 325 mg by mouth every evening.   . brimonidine (ALPHAGAN) 0.15 % ophthalmic solution Place 1 drop into the left eye 2 (two) times daily.   . dolutegravir (TIVICAY) 50 MG tablet Take 1 tablet (50 mg total) by mouth daily.  . dorzolamide-timolol (COSOPT) 22.3-6.8 MG/ML ophthalmic solution Place 1 drop into the left eye 2 (two) times daily.   Marland Kitchen emtricitabine-tenofovir AF (DESCOVY) 200-25 MG tablet Take 1 tablet by mouth daily.  . furosemide (LASIX) 40 MG tablet Take 2 tablets (80 mg total) by mouth daily. (Patient taking differently: Take 80 mg by mouth every morning. )  . latanoprost (XALATAN) 0.005 % ophthalmic solution Place 1 drop into the left eye at bedtime.   . ranitidine (ZANTAC) 150 MG tablet Take 1 tablet (150 mg total) by mouth 2 (two) times daily.  . Vitamin D, Ergocalciferol, (DRISDOL) 50000 UNITS CAPS capsule Take 50,000 units by mouth once a week on Saturdays  . carvedilol (COREG) 25 MG tablet TAKE 1 TABLET TWICE DAILY (Patient taking differently: Take 25 mg by mouth two times a day)    FAMILY HISTORY:  His indicated that his mother is deceased.  He indicated that his father is deceased. He indicated that the status of his maternal grandmother is unknown. He indicated that his daughter is alive. He indicated that his son is alive. He indicated that the status of his neg hx is unknown.   SOCIAL HISTORY: He   reports that he quit smoking about 11 years ago. he has never used smokeless tobacco. He reports that he drinks alcohol. He reports that he does not use drugs.  REVIEW OF SYSTEMS:   Unable to review as patient is intubated and sedated   SUBJECTIVE:  Extubated yesterday.  Had resp distress overnight requiring Bipap and lasix Stable today AM, denies dyspnea  VITAL SIGNS: BP (!) 158/87   Pulse 87   Temp 98.7 F (37.1 C) (Oral)   Resp 20   Ht 6' (1.829 m)   Wt 214 lb 8.1 oz (97.3 kg)   SpO2 96%   BMI 29.09 kg/m   HEMODYNAMICS: CVP:  [10 mmHg-16 mmHg] 10 mmHg  VENTILATOR SETTINGS: Vent Mode: Stand-by FiO2 (%):  [40 %] 40 % Set Rate:  [15 bmp] 15 bmp PEEP:  [5 cmH20] 5 cmH20 Pressure Support:  [5 cmH20] 5 cmH20  INTAKE / OUTPUT: I/O last 3 completed shifts: In: 2600 [I.V.:700; IV Piggyback:1900] Out: 3205 [Urine:3205]  PHYSICAL EXAMINATION Gen:      No acute distress HEENT:  EOMI, sclera anicteric Neck:     No masses; no thyromegaly Lungs:    Scattered rhonchi; normal respiratory effort CV:         Regular rate and rhythm; no murmurs Abd:      + bowel sounds; soft, non-tender; no palpable masses, no distension Ext:    No edema; adequate peripheral perfusion Skin:      Warm and dry; no rash Neuro: alert and oriented x 3 Psych: normal mood and affect  LABS:  BMET Recent Labs  Lab 11/25/17 0611 11/26/17 0610 11/27/17 0500  NA 139 140 140  K 4.7 4.4 4.4  CL 109 111 110  CO2 21* 21* 21*  BUN 72* 75* 70*  CREATININE 4.76* 4.58* 4.59*  GLUCOSE 171* 128* 133*    Electrolytes Recent Labs  Lab 11/25/17 0611 11/26/17 0610 11/27/17 0500  CALCIUM 8.3* 8.4* 8.6*  MG 2.0 2.1 2.2  PHOS 5.3* 4.8* 4.6    CBC Recent Labs  Lab 11/25/17 0611 11/26/17 0610 11/27/17 0500  WBC 11.2* 12.2* 13.7*  HGB 11.0* 10.1* 11.0*  HCT 34.3* 31.1* 33.7*  PLT 178 176 214    Coag's No results for input(s): APTT, INR in the last 168 hours.  Sepsis Markers Recent Labs   Lab 11/22/17 2230 11/24/17 2045 11/25/17 0611 11/26/17 0610  LATICACIDVEN 1.05 1.4  --   --   PROCALCITON  --  10.39 32.44 35.40    ABG Recent Labs  Lab 11/24/17 1816 11/24/17 2115 11/26/17 0436  PHART 7.225* 7.341* 7.348*  PCO2ART 56.7* 38.1 36.2  PO2ART 69.4* 71.0* 112.0*    Liver Enzymes Recent Labs  Lab 11/22/17 2224  AST 22  ALT 20  ALKPHOS 104  BILITOT 0.6  ALBUMIN 3.7    Cardiac Enzymes Recent Labs  Lab 11/25/17 1950 11/27/17 0144 11/27/17 0500  TROPONINI 0.05* 0.08* 0.09*    Glucose Recent Labs  Lab 11/26/17 1134 11/26/17 1527 11/26/17 1954 11/27/17 0000 11/27/17 0336 11/27/17 0801  GLUCAP 98 150* 131* 164* 121* 107*   STUDIES:  CXR 12/21 > Decreased right upper and bilateral lower lobe pulmonary infiltrates or edema. 2.  Cardiomegaly with vascular congestion. CXR 12/22 > Possible slight interval worsening of multifocal airspace process most involving the right lung likely multifocal pneumonia CT chest 12/23 > multifocal consolidation consistent with pneumonia. CXR 12/25 > Improvement in B/L opacities. I have reviewed the images personally  CULTURES: Blood 12/20 >> MRSA PCR 12/21 > Negative  Step Pneumo 12/21 > Negative  L. Pneumo 12/21 > Negative  Blood 12/22 >> Sputum 12/22 >>  Bronch BAL 12/23 >>  ANTIBIOTICS: Unasyn 12/21 >> Azithromycin 12/20 > 12/21 Linezolid 12/21 >> Bactrim 12/23 >  SIGNIFICANT EVENTS: 12/21 > Presents to ED with multilobar PNA 12/22 > Intubation 12/23 Bronch with BAL 12/24 > Extubation  LINES/TUBES: ETT 12/22 >> 12/24 RIJ CVC 12/22 >>  DISCUSSION: 76 year old male presents to ED with multilobar PNA. Admitted to step-down. On 12/22 required intubation.   ASSESSMENT / PLAN:  PULMONARY A: Acute Hypoxic Respiratory Failure in setting of multifocal PNA +Acute on Chronic Heart Failure   P:   Stable post extubation Use Bipap as needed Start lasix for diuresis as he is getting a lot of fluid  with antibiotics Follow BAL results  CARDIOVASCULAR A:  Acute on Chronic Systolic/Diasoltic HF in setting of nonischemic cardiomyopathy  08/2017 EF 25-30, G1DD with severely reduced systolic function  Syncopal Events starting August 2018 H/O HTN -Follows 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 Posey Pronto in Office  P:   Trend BMP  Replace electrolytes as indicated  Avoid Nephrotoxins  No Indication for HD at this time  GASTROINTESTINAL A:   SUP  P:   PO diet PPI   HEMATOLOGIC A:   Macrocytic Anemia  B12 > 655 Folate >  P:  Trend CBC   INFECTIOUS A:   Multifocal PNA  Aspiration Event  HIV/AIDS -Flu negative, MRSA Negative, Step P negative, Legionella negative  -PCT 10.39  P:   Currently on Unasyn, Zyvox and bactrim per ID Waiting for results on BAL Continue Tivicay, Descovy    ENDOCRINE A:   Hyperglycemia, on steroids P:   Trend Glucose  SSI coverage  NEUROLOGIC A:   Acute Metabolic Encephalopathy  P:   RASS goal: 0/-1 Wean Precedex to achieve RASS PRN Fentanyl   FAMILY  - Updates: Pt and family updated at bedside.  - Inter-disciplinary family meet or Palliative Care meeting due by: 12/01/2017  Stable for transfer from ICU.  Marshell Garfinkel MD [facility] Pager [phone] If no answer or after 3pm call: [phone] 11/27/2017, 9:53 AM

## 2017-11-27 NOTE — Progress Notes (Signed)
PCCM Interval Note   Patient with hypoxia/tachpnea and crackles throughout. STAT CXR ordered, BiPAP, and 60 meq of lasix  Hayden Pedro, AGACNP-BC  Pulmonary & Critical Care  Pgr: [phone]  PCCM Pgr: [phone]

## 2017-11-28 ENCOUNTER — Telehealth: Payer: Self-pay | Admitting: Cardiovascular Disease

## 2017-11-28 ENCOUNTER — Inpatient Hospital Stay (HOSPITAL_COMMUNITY): Payer: Medicare HMO

## 2017-11-28 LAB — CBC
HEMATOCRIT: 34.8 % — AB (ref 39.0–52.0)
Hemoglobin: 11.2 g/dL — ABNORMAL LOW (ref 13.0–17.0)
MCH: 32.1 pg (ref 26.0–34.0)
MCHC: 32.2 g/dL (ref 30.0–36.0)
MCV: 99.7 fL (ref 78.0–100.0)
Platelets: 227 10*3/uL (ref 150–400)
RBC: 3.49 MIL/uL — ABNORMAL LOW (ref 4.22–5.81)
RDW: 13.8 % (ref 11.5–15.5)
WBC: 11.7 10*3/uL — ABNORMAL HIGH (ref 4.0–10.5)

## 2017-11-28 LAB — CULTURE, BLOOD (ROUTINE X 2)
Culture: NO GROWTH
Special Requests: ADEQUATE

## 2017-11-28 LAB — MAGNESIUM: Magnesium: 2.3 mg/dL (ref 1.7–2.4)

## 2017-11-28 LAB — ACID FAST SMEAR (AFB)

## 2017-11-28 LAB — BASIC METABOLIC PANEL
Anion gap: 13 (ref 5–15)
BUN: 75 mg/dL — AB (ref 6–20)
CALCIUM: 9 mg/dL (ref 8.9–10.3)
CO2: 23 mmol/L (ref 22–32)
CREATININE: 4.79 mg/dL — AB (ref 0.61–1.24)
Chloride: 106 mmol/L (ref 101–111)
GFR calc Af Amer: 12 mL/min — ABNORMAL LOW (ref >60)
GFR calc non Af Amer: 11 mL/min — ABNORMAL LOW (ref >60)
GLUCOSE: 147 mg/dL — AB (ref 65–99)
Potassium: 4.6 mmol/L (ref 3.5–5.1)
Sodium: 142 mmol/L (ref 135–145)

## 2017-11-28 LAB — GLUCOSE, CAPILLARY
GLUCOSE-CAPILLARY: 134 mg/dL — AB (ref 65–99)
GLUCOSE-CAPILLARY: 119 mg/dL — AB (ref 65–99)
GLUCOSE-CAPILLARY: 140 mg/dL — AB (ref 65–99)
GLUCOSE-CAPILLARY: 106 mg/dL — AB (ref 65–99)
Glucose-Capillary: 134 mg/dL — ABNORMAL HIGH (ref 65–99)
Glucose-Capillary: 150 mg/dL — ABNORMAL HIGH (ref 65–99)

## 2017-11-28 LAB — ACID FAST SMEAR (AFB, MYCOBACTERIA): Acid Fast Smear: NEGATIVE

## 2017-11-28 LAB — PNEUMOCYSTIS JIROVECI SMEAR BY DFA: Pneumocystis jiroveci Ag: NEGATIVE

## 2017-11-28 LAB — PHOSPHORUS: Phosphorus: 6.1 mg/dL — ABNORMAL HIGH (ref 2.5–4.6)

## 2017-11-28 NOTE — Care Management Note (Addendum)
Case Management Note  Patient Details  Name: David [name] MRN: [id number] Date of Birth: 1941-03-30  Subjective/Objective:  From home with wife, presents with sepsis secondary to asp pna, chronic chf, ckd, acute renal failure, 042, extubated 12/24 to 4 liters [place], conts on unasyn and lasix iv , protonix iv. PT eval rec SNF.                   Action/Plan: NCM will follow for dc needs.   Expected Discharge Date:                  Expected Discharge Plan:  [place]  In-House Referral:     Discharge planning Services  CM Consult  Post Acute Care Choice:    Choice offered to:     DME Arranged:    DME Agency:     HH Arranged:    [place] Agency:     Status of Service:  In process, will continue to follow  If discussed at Long Length of Stay Meetings, dates discussed:    Additional Comments:  [name], RN 11/28/2017, 4:27 PM

## 2017-11-28 NOTE — Telephone Encounter (Signed)
Currently Admitted (since 11/22/2017)

## 2017-11-28 NOTE — Telephone Encounter (Signed)
New message   TCM appt for 1/3@11 :[place]

## 2017-11-28 NOTE — Progress Notes (Signed)
PROGRESS NOTE    David [name]  YFV:[id number] DOB: 04/20/1941 DOA: 11/22/2017 PCP: [name], NP   Brief Narrative: David [name] is a 76 y.o. male with PMH of Anemia, CKD stage IV, HIV (CD4 94 and VL undetectable in October), HLD, HTN, Chronic mixed systolic/diastolic HF (EF 16-38, G6KZ with severely reduced systolic dysfunction), prostate CA s/p brachytherapy. He presented with dyspnea and was found to have multilobar pneumonia. He was started on empiric antibiotics but developed respiratory failure requiring intubation. He was extubated successfully on 12/24, required BiPAP for a short while and is now on oxygen via nasal canula. He has concurrently been treated for a CHF exacerbation and initially treated for PCP, which has been discontinued.   Assessment & Plan:   Principal Problem:   Sepsis due to pneumonia [facility]) Active Problems:   Essential hypertension   Chronic combined systolic and diastolic heart failure (HCC)   Macrocytic anemia   CKD (chronic kidney disease) stage 4, GFR 15-29 ml/min (HCC)   AIDS (HCC)   Acute renal failure superimposed on stage 4 chronic kidney disease ([place])   Community acquired pneumonia of right lung   Aspiration pneumonia of both lower lobes due to regurgitated food ([place])   Multifocal pneumonia Acute hypoxic respiratory failure Patient required intubation on 12/22.   He was successfully extubated on December 24 and transitioned to BiPAP.  He is now on 4 L nasal cannula.  Blood cultures with no growth to date.  -Infectious disease recommendations: Continue Unasyn -AFB culture pending  HIV AIDS Last CD4 count of 170.  Patient is adherent with therapy.  Patient was previously on Bactrim which has been discontinued per his recommendations. -Infectious disease recommendations: continue Descovy and Tivicay  Acute on chronic combined systolic and diastolic heart failure Last echocardiogram (20-25%) significant for a reduced EF of  20-25% with diffuse hypokinesis. Follows with [name]. Plan was for a cardiac CT as an outpatient -Will consult cardiology in setting of heart failure exacerbation and worsened cardiomyopathy. Not on an ACEi/ARB secondary to allergy. -Daily weights/in and out -Continue Coreg -Continue Lasix  ?Pneumocystis  Per infectious disease, unlikely. AFB smear negative. Bactrim discontinued -Discontinue steroid taper  Acute kidney injury on CKD IV-5 Patient follows with Dr. Posey Pronto. He has had two failed fistulas. Creatinine stable in setting of diuresis. Good urine output. Baseline creatinine appears to be 3.6-3.8. -Continue diuresis -If no improvement, will consult nephrology  Hyperglycemia Secondary to steroids. -Discontinue steroids -SSI  Essential hypertension Slightly uncontrolled. -Continue amlodipine and Coreg   DVT prophylaxis: Heparin Code Status: Full code Family Communication: None at bedside Disposition Plan: Pending medical improvement   Consultants:   PCCM  Infectious disease  Procedures:   ETT (12/22>>12/24  Antimicrobials: Unasyn 12/21 >>  Azithromycin 12/20 > 12/21 Linezolid 12/21 >>12/24 Bactrim 12/23 >12/24   Subjective: Feels good today. No chest pain. Dyspnea improved. On 4L  Objective: Vitals:   11/28/17 0000 11/28/17 0400 11/28/17 0730 11/28/17 0825  BP: (!) 141/83 (!) 136/98  (!) 162/90  Pulse: 75 82 78 78  Resp: (!) _0 Temp:      TempSrc:      SpO2: 97% 98% 100%   Weight:      Height:        Intake/Output Summary (Last 24 hours) at 11/28/2017 1146 Last data filed at 11/28/2017 1045 Gross per 24 hour  Intake 960 ml  Output 4800 ml  Net -3840 ml   [employer]  11/25/17 0500 11/26/17 0500 11/27/17 0500  Weight: 97.1 kg (214 lb 1.1 oz) 100.1 kg (220 lb 10.9 oz) 97.3 kg (214 lb 8.1 oz)    Examination:  General exam: Appears calm and comfortable Respiratory system: Clear to auscultation. Respiratory effort  normal. Cardiovascular system: S1 & S2 heard, RRR. No murmurs, rubs, gallops or clicks. Gastrointestinal system: Abdomen is nondistended, soft and nontender. No organomegaly or masses felt. Normal bowel sounds heard. Central nervous system: Alert and oriented. No focal neurological deficits. Extremities: No edema. No calf tenderness Skin: No cyanosis. No rashes Psychiatry: Judgement and insight appear normal. Mood & affect appropriate.     Data Reviewed: I have personally reviewed following labs and imaging studies  CBC: Recent Labs  Lab 11/22/17 2224  11/23/17 0327 11/24/17 0335 11/25/17 0611 11/26/17 0610 11/27/17 0500 11/28/17 1014  WBC 17.1*  --  11.7* 13.9* 11.2* 12.2* 13.7* 11.7*  NEUTROABS 14.5*  --  9.4*  --   --   --   --   --   HGB 12.5*   < > 11.7* 10.8* 11.0* 10.1* 11.0* 11.2*  HCT 39.6   < > 36.1* 32.8* 34.3* 31.1* 33.7* 34.8*  MCV 102.9*  --  100.8* 100.3* 101.8* 101.0* 101.5* 99.7  PLT 206  --  238 210 178 176 214 227   < > = values in this interval not displayed.   Basic Metabolic Panel: Recent Labs  Lab 11/25/17 0009 11/25/17 0611 11/26/17 0610 11/27/17 0500 11/28/17 1014  NA 136 139 140 140 142  K 4.5 4.7 4.4 4.4 4.6  CL 105 109 111 110 106  CO2 21* 21* 21* 21* 23  GLUCOSE 177* 171* 128* 133* 147*  BUN 69* 72* 75* 70* 75*  CREATININE 4.64* 4.76* 4.58* 4.59* 4.79*  CALCIUM 8.1* 8.3* 8.4* 8.6* 9.0  MG  --  2.0 2.1 2.2 2.3  PHOS  --  5.3* 4.8* 4.6 6.1*   GFR: Estimated Creatinine Clearance: 16.1 mL/min (A) (by C-G formula based on SCr of 4.79 mg/dL (H)). Liver Function Tests: Recent Labs  Lab 11/22/17 2224  AST 22  ALT 20  ALKPHOS 104  BILITOT 0.6  PROT 7.5  ALBUMIN 3.7   No results for input(s): LIPASE, AMYLASE in the last 168 hours. No results for input(s): AMMONIA in the last 168 hours. Coagulation Profile: No results for input(s): INR, PROTIME in the last 168 hours. Cardiac Enzymes: Recent Labs  Lab 11/25/17 0611 11/25/17 0816  11/25/17 1950 11/27/17 0144 11/27/17 0500  TROPONINI 0.07* 0.07* 0.05* 0.08* 0.09*   BNP (last 3 results) No results for input(s): PROBNP in the last 8760 hours. HbA1C: No results for input(s): HGBA1C in the last 72 hours. CBG: Recent Labs  Lab 11/27/17 1639 11/27/17 1946 11/27/17 2320 11/28/17 0354 11/28/17 0816  GLUCAP 128* 125* 114* 134* 119*   Lipid Profile: No results for input(s): CHOL, HDL, LDLCALC, TRIG, CHOLHDL, LDLDIRECT in the last 72 hours. Thyroid Function Tests: No results for input(s): TSH, T4TOTAL, FREET4, T3FREE, THYROIDAB in the last 72 hours. Anemia Panel: No results for input(s): VITAMINB12, FOLATE, FERRITIN, TIBC, IRON, RETICCTPCT in the last 72 hours. Sepsis Labs: Recent Labs  Lab 11/22/17 2230 11/24/17 2045 11/25/17 0611 11/26/17 0610  PROCALCITON  --  10.39 32.44 35.40  LATICACIDVEN 1.05 1.4  --   --     Recent Results (from the past 240 hour(s))  Blood culture (routine x 2)     Status: None (Preliminary result)   Collection Time: 11/22/17  11:40 PM  Result Value Ref Range Status   Specimen Description BLOOD RIGHT HAND  Final   Special Requests   Final    BOTTLES DRAWN AEROBIC AND ANAEROBIC Blood Culture adequate volume   Culture NO GROWTH 4 DAYS  Final   Report Status PENDING  Incomplete  MRSA PCR Screening     Status: None   Collection Time: 11/23/17  6:41 AM  Result Value Ref Range Status   MRSA by PCR NEGATIVE NEGATIVE Final    Comment:        The GeneXpert MRSA Assay (FDA approved for NASAL specimens only), is one component of a comprehensive MRSA colonization surveillance program. It is not intended to diagnose MRSA infection nor to guide or monitor treatment for MRSA infections.   Culture, blood (single)     Status: None (Preliminary result)   Collection Time: 11/24/17  8:45 PM  Result Value Ref Range Status   Specimen Description BLOOD CENTRAL LINE  Final   Special Requests   Final    IN PEDIATRIC BOTTLE Blood Culture  results may not be optimal due to an excessive volume of blood received in culture bottles   Culture NO GROWTH 3 DAYS  Final   Report Status PENDING  Incomplete  Culture, respiratory (NON-Expectorated)     Status: None   Collection Time: 11/24/17  9:00 PM  Result Value Ref Range Status   Specimen Description TRACHEAL ASPIRATE  Final   Special Requests NONE  Final   Gram Stain   Final    MODERATE WBC PRESENT,BOTH PMN AND MONONUCLEAR NO SQUAMOUS EPITHELIAL CELLS SEEN RARE BUDDING YEAST SEEN    Culture RARE Consistent with normal respiratory flora.  Final   Report Status 11/27/2017 FINAL  Final  Respiratory Panel by PCR     Status: None   Collection Time: 11/25/17  2:09 AM  Result Value Ref Range Status   Adenovirus NOT DETECTED NOT DETECTED Final   Coronavirus 229E NOT DETECTED NOT DETECTED Final   Coronavirus HKU1 NOT DETECTED NOT DETECTED Final   Coronavirus NL63 NOT DETECTED NOT DETECTED Final   Coronavirus OC43 NOT DETECTED NOT DETECTED Final   Metapneumovirus NOT DETECTED NOT DETECTED Final   Rhinovirus / Enterovirus NOT DETECTED NOT DETECTED Final   Influenza A NOT DETECTED NOT DETECTED Final   Influenza B NOT DETECTED NOT DETECTED Final   Parainfluenza Virus 1 NOT DETECTED NOT DETECTED Final   Parainfluenza Virus 2 NOT DETECTED NOT DETECTED Final   Parainfluenza Virus 3 NOT DETECTED NOT DETECTED Final   Parainfluenza Virus 4 NOT DETECTED NOT DETECTED Final   Respiratory Syncytial Virus NOT DETECTED NOT DETECTED Final   Bordetella pertussis NOT DETECTED NOT DETECTED Final   Chlamydophila pneumoniae NOT DETECTED NOT DETECTED Final   Mycoplasma pneumoniae NOT DETECTED NOT DETECTED Final  Culture, bal-quantitative     Status: None   Collection Time: 11/25/17 11:33 AM  Result Value Ref Range Status   Specimen Description BRONCHIAL ALVEOLAR LAVAGE  Final   Special Requests Immunocompromised  Final   Gram Stain   Final    ABUNDANT WBC PRESENT,BOTH PMN AND MONONUCLEAR NO  SQUAMOUS EPITHELIAL CELLS SEEN NO ORGANISMS SEEN    Culture NO GROWTH 2 DAYS  Final   Report Status 11/27/2017 FINAL  Final  Acid Fast Smear (AFB)     Status: None   Collection Time: 11/25/17 11:34 AM  Result Value Ref Range Status   AFB Specimen Processing Concentration  Final   Acid Fast Smear  Negative  Final    Comment: (NOTE) Performed At: [facility] [place], [place] [id number] Rush Farmer MD VJ:[id number]    Source (AFB) BRONCHIAL ALVEOLAR LAVAGE  Final         Radiology Studies: Dg Chest Port 1 View  Result Date: 11/28/2017 CLINICAL DATA:  76 year old male status post bilateral pneumonia. Shortness of breath. EXAM: PORTABLE CHEST 1 VIEW COMPARISON:  11/27/2017 and earlier. FINDINGS: Portable AP semi upright view at 0651 hours stable right IJ central line. Regressed course, interstitial, and indistinct pulmonary opacity since yesterday. Left lung ventilation now approaching normal. Mild residual indistinct right lung opacity. Stable cardiomegaly and mediastinal contours. Visualized tracheal air column is within normal limits. No pneumothorax or pleural effusion. Stable visualized osseous structures. IMPRESSION: 1. Stable right IJ central line. 2. Regressed bilateral pneumonia with improving bilateral ventilation, near normal on the left. 3. No new cardiopulmonary abnormality. Electronically Signed   By: Genevie Ann M.D.   On: 11/28/2017 07:53   Dg Chest Port 1 View  Result Date: 11/27/2017 CLINICAL DATA:  76 year old male with respiratory distress. EXAM: PORTABLE CHEST 1 VIEW COMPARISON:  Chest radiograph dated 11/26/2017 FINDINGS: There has been interval removal of the endotracheal and enteric tubes. Right IJ central line remains in similar position. There is stable cardiomegaly. There is diffuse airspace density in the right lung with significant interval improvement compared to the CT of 11/25/2017. Slight residual density also present at the left lung  base. No large pleural effusion. No pneumothorax. No acute osseous pathology. IMPRESSION: 1. Interval removal of the endotracheal and enteric tubes. 2. Persistent airspace density predominantly involving the right lung with overall interval improvement compared to the CT of 11/25/2017. Clinical correlation and follow-up recommended. Electronically Signed   By: Anner Crete M.D.   On: 11/27/2017 02:05        Scheduled Meds: . amLODipine  10 mg Oral Daily  . aspirin  81 mg Per Tube Daily  . brimonidine  1 drop Left Eye BID  . carvedilol  25 mg Per Tube BID WC  . chlorhexidine gluconate (MEDLINE KIT)  15 mL Mouth Rinse BID  . Chlorhexidine Gluconate Cloth  6 each Topical Q0600  . dolutegravir  50 mg Oral Daily  . dorzolamide-timolol  1 drop Left Eye BID  . emtricitabine-tenofovir AF  1 tablet Per Tube Daily  . furosemide  40 mg Intravenous Q12H  . heparin  5,000 Units Subcutaneous Q8H  . insulin aspart  0-15 Units Subcutaneous Q4H  . latanoprost  1 drop Left Eye QHS  . mouth rinse  15 mL Mouth Rinse QID  . methylPREDNISolone (SOLU-MEDROL) injection  30 mg Intravenous BID   Followed by  . [START ON 11/30/2017] methylPREDNISolone (SOLU-MEDROL) injection  30 mg Intravenous Daily   Followed by  . [START ON 12/05/2017] methylPREDNISolone (SOLU-MEDROL) injection  15.2 mg Intravenous Daily  . pantoprazole (PROTONIX) IV  40 mg Intravenous Q24H  . sodium chloride flush  3 mL Intravenous Q12H   Continuous Infusions: . sodium chloride 250 mL (11/26/17 1900)  . sodium chloride    . ampicillin-sulbactam (UNASYN) IV Stopped (11/28/17 0610)  . dexmedetomidine Stopped (11/26/17 1515)     LOS: 5 days     Cordelia Poche, MD [facility] 11/28/2017, 11:46 AM Pager: [phone]  If 7PM-7AM, please contact night-coverage [email or web address] Password [facility] 11/28/2017, 11:46 AM

## 2017-11-28 NOTE — Evaluation (Signed)
Physical Therapy Evaluation Patient Details Name: David [name] MRN: [id number] DOB: [birth year] Today's Date: 11/28/2017   History of Present Illness  David [name] is a 76 y.o. 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 He presented with dyspnea and was found to have multilobar pneumonia. He was started on empiric antibiotics but developed respiratory failure requiring intubation. He was extubated successfully on 12/24, required BiPAP for a short while and is now on oxygen via nasal canula.  Clinical Impression  Pt admitted with above diagnosis. Pt currently with functional limitations due to the deficits listed below (see PT Problem List). Pt was able to take a few pivotal steps to chair.  C/o dizziness therefore did not push walking.  Pt used walker and has not had to in the past.  Will follow acutely.   Pt will benefit from skilled PT to increase their independence and safety with mobility to allow discharge to the venue listed below.      Follow Up Recommendations SNF;Supervision/Assistance - 24 hour    Equipment Recommendations  3in1 (PT)    Recommendations for Other Services       Precautions / Restrictions Precautions Precautions: Fall Restrictions Weight Bearing Restrictions: No      Mobility  Bed Mobility Overal bed mobility: Needs Assistance Bed Mobility: Supine to Sit     Supine to sit: Min assist     General bed mobility comments: assist with elevation of trunk and to scoot to EOB.   Transfers Overall transfer level: Needs assistance Equipment used: Rolling walker (2 wheeled) Transfers: Sit to/from [facility] Sit to Stand: Mod assist Stand pivot transfers: Min assist       General transfer comment: Needed assist to power up and multiple attempts initially.  Pt  reports dizziness however no BP issues.  Other VSS. Only pivoted to chair as pt was unsteady and dizzy.    Ambulation/Gait                Stairs            Wheelchair Mobility    Modified Rankin (Stroke Patients Only)       Balance Overall balance assessment: Needs assistance Sitting-balance support: No upper extremity supported;Feet supported Sitting balance-Leahy Scale: Fair     Standing balance support: Bilateral upper extremity supported;During functional activity Standing balance-Leahy Scale: Poor Standing balance comment: relies on UE support.                              Pertinent Vitals/Pain Pain Assessment: No/denies pain    Home Living Family/patient expects to be discharged to:: Private residence Living Arrangements: Spouse/significant other Available Help at Discharge: Family;Available PRN/intermittently(wife works, pt is retired) Type of Home: [employer] Access: Stairs to enter Entrance Stairs-Rails: Right;Left;Can reach both Technical brewer of Steps: [place]: One level Home Equipment: [place] - single point;Walker - 2 wheels      Prior Function Level of Independence: Independent;Independent with assistive device(s)         Comments: used cane sometimes     Hand Dominance   Dominant Hand: Right    Extremity/Trunk Assessment   Upper Extremity Assessment Upper Extremity Assessment: Defer to OT evaluation    Lower Extremity Assessment Lower Extremity Assessment: Generalized weakness  Cervical / Trunk Assessment Cervical / Trunk Assessment: Kyphotic  Communication   Communication: No difficulties  Cognition Arousal/Alertness: Awake/alert Behavior During Therapy: WFL for tasks assessed/performed Overall Cognitive Status: Within Functional Limits for tasks assessed                                        General Comments      Exercises     Assessment/Plan    PT Assessment Patient  needs continued PT services  PT Problem List Decreased strength;Decreased activity tolerance;Decreased balance;Decreased mobility;Decreased knowledge of use of DME;Decreased safety awareness;Decreased knowledge of precautions;Cardiopulmonary status limiting activity       PT Treatment Interventions DME instruction;Gait training;Functional mobility training;Therapeutic activities;Therapeutic exercise;Balance training;Stair training;Patient/family education    PT Goals (Current goals can be found in the Care Plan section)  Acute Rehab PT Goals Patient Stated Goal: to get better PT Goal Formulation: With patient Time For Goal Achievement: 12/12/17 Potential to Achieve Goals: Good    Frequency Min 3X/week   Barriers to discharge Decreased caregiver support      Co-evaluation               AM-PAC PT "6 Clicks" Daily Activity  Outcome Measure Difficulty turning over in bed (including adjusting bedclothes, sheets and blankets)?: Unable Difficulty moving from lying on back to sitting on the side of the bed? : A Little Difficulty sitting down on and standing up from a chair with arms (e.g., wheelchair, bedside commode, etc,.)?: A Lot Help needed moving to and from a bed to chair (including a wheelchair)?: A Little Help needed walking in hospital room?: Total Help needed climbing 3-5 steps with a railing? : Total 6 Click Score: 11    End of Session Equipment Utilized During Treatment: Gait belt;Oxygen Activity Tolerance: Patient limited by fatigue Patient left: in chair;with call bell/phone within reach;with chair alarm set Nurse Communication: Mobility status PT Visit Diagnosis: Unsteadiness on feet (R26.81);Muscle weakness (generalized) (M62.81)    Time: 2202-5427 PT Time Calculation (min) (ACUTE ONLY): 18 min   Charges:   PT Evaluation $PT Eval Moderate Complexity: 1 Mod     PT G Codes:        David [name],PT Acute Rehabilitation [phone]  (pager)   Denice Paradise 11/28/2017, 4:40 PM

## 2017-11-28 NOTE — Progress Notes (Signed)
Patient ID: David [name], male   DOB: [birth year], 76 y.o.   MRN: [id number]          [facility]  Date of Admission:  11/22/2017   Total days of antibiotics 7        Day 6 ampicillin sulbactam         ASSESSMENT: He is doing much better after 1 week of empiric antibiotics for community-acquired pneumonia.  I will stop ampicillin sulbactam today.  He will continue Descovy and [place].  PLAN: 1. Discontinue ampicillin sulbactam 2. Continue antiretroviral therapy 3. He has follow-up scheduled in our clinic on 12/31/2017 4. I will sign off now  Principal Problem:   Sepsis due to pneumonia [facility]) Active Problems:   Essential hypertension   Chronic combined systolic and diastolic heart failure (HCC)   Macrocytic anemia   CKD (chronic kidney disease) stage 4, GFR 15-29 ml/min (HCC)   AIDS (HCC)   Acute renal failure superimposed on stage 4 chronic kidney disease ([place])   Community acquired pneumonia of right lung   Aspiration pneumonia of both lower lobes due to regurgitated food ([place])   Scheduled Meds: . amLODipine  10 mg Oral Daily  . aspirin  81 mg Per Tube Daily  . brimonidine  1 drop Left Eye BID  . carvedilol  25 mg Per Tube BID WC  . chlorhexidine gluconate (MEDLINE KIT)  15 mL Mouth Rinse BID  . Chlorhexidine Gluconate Cloth  6 each Topical Q0600  . dolutegravir  50 mg Oral Daily  . dorzolamide-timolol  1 drop Left Eye BID  . emtricitabine-tenofovir AF  1 tablet Per Tube Daily  . furosemide  40 mg Intravenous Q12H  . heparin  5,000 Units Subcutaneous Q8H  . insulin aspart  0-15 Units Subcutaneous Q4H  . latanoprost  1 drop Left Eye QHS  . mouth rinse  15 mL Mouth Rinse QID  . pantoprazole (PROTONIX) IV  40 mg Intravenous Q24H  . sodium chloride flush  3 mL Intravenous Q12H   Continuous Infusions: . sodium chloride 250 mL (11/26/17 1900)  . sodium chloride    . ampicillin-sulbactam (UNASYN) IV 3 g (11/28/17 1713)   PRN Meds:.sodium  chloride, sodium chloride, fentaNYL (SUBLIMAZE) injection, fentaNYL (SUBLIMAZE) injection, hydrALAZINE, sodium chloride flush   SUBJECTIVE: He is feeling much better today.  He denies any shortness of breath.  He had one episode of nausea with vomiting this morning following which he coughed up a small amount of sputum with a fleck of blood in it.  Review of Systems: Review of Systems  Constitutional: Negative for chills, diaphoresis and fever.  Respiratory: Positive for cough, hemoptysis and sputum production. Negative for shortness of breath.   Cardiovascular: Negative for chest pain.    Allergies  Allergen Reactions  . Codeine Other (See Comments)    Causes constipation  . Cozaar [Losartan Potassium] Other (See Comments)    Causes constipation     OBJECTIVE: Vitals:   11/28/17 0400 11/28/17 0730 11/28/17 0825 11/28/17 1643  BP: (!) 136/98  (!) 162/90 (!) 153/84  Pulse: 82 78 78 80  Resp: 17 18    Temp:      TempSrc:      SpO2: 98% 100%    Weight:      Height:       Body mass index is 29.09 kg/m.  Physical Exam  Constitutional: He is oriented to person, place, and time.  He looks much better.  He is  sitting up in a chair.  He is smiling.  Cardiovascular: Normal rate and regular rhythm.  No murmur heard. Pulmonary/Chest: Effort normal. He has no wheezes. He has no rales.  Neurological: He is alert and oriented to person, place, and time.  Psychiatric: Mood and affect normal.    Lab Results Lab Results  Component Value Date   WBC 11.7 (H) 11/28/2017   HGB 11.2 (L) 11/28/2017   HCT 34.8 (L) 11/28/2017   MCV 99.7 11/28/2017   PLT 227 11/28/2017    Lab Results  Component Value Date   CREATININE 4.79 (H) 11/28/2017   BUN 75 (H) 11/28/2017   NA 142 11/28/2017   K 4.6 11/28/2017   CL 106 11/28/2017   CO2 23 11/28/2017    Lab Results  Component Value Date   ALT 20 11/22/2017   AST 22 11/22/2017   ALKPHOS 104 11/22/2017   BILITOT 0.6 11/22/2017      Microbiology: Recent Results (from the past 240 hour(s))  Blood culture (routine x 2)     Status: None   Collection Time: 11/22/17 11:40 PM  Result Value Ref Range Status   Specimen Description BLOOD RIGHT HAND  Final   Special Requests   Final    BOTTLES DRAWN AEROBIC AND ANAEROBIC Blood Culture adequate volume   Culture NO GROWTH 5 DAYS  Final   Report Status 11/28/2017 FINAL  Final  MRSA PCR Screening     Status: None   Collection Time: 11/23/17  6:41 AM  Result Value Ref Range Status   MRSA by PCR NEGATIVE NEGATIVE Final    Comment:        The GeneXpert MRSA Assay (FDA approved for NASAL specimens only), is one component of a comprehensive MRSA colonization surveillance program. It is not intended to diagnose MRSA infection nor to guide or monitor treatment for MRSA infections.   Culture, blood (single)     Status: None (Preliminary result)   Collection Time: 11/24/17  8:45 PM  Result Value Ref Range Status   Specimen Description BLOOD CENTRAL LINE  Final   Special Requests   Final    IN PEDIATRIC BOTTLE Blood Culture results may not be optimal due to an excessive volume of blood received in culture bottles   Culture NO GROWTH 4 DAYS  Final   Report Status PENDING  Incomplete  Culture, respiratory (NON-Expectorated)     Status: None   Collection Time: 11/24/17  9:00 PM  Result Value Ref Range Status   Specimen Description TRACHEAL ASPIRATE  Final   Special Requests NONE  Final   Gram Stain   Final    MODERATE WBC PRESENT,BOTH PMN AND MONONUCLEAR NO SQUAMOUS EPITHELIAL CELLS SEEN RARE BUDDING YEAST SEEN    Culture RARE Consistent with normal respiratory flora.  Final   Report Status 11/27/2017 FINAL  Final  Respiratory Panel by PCR     Status: None   Collection Time: 11/25/17  2:09 AM  Result Value Ref Range Status   Adenovirus NOT DETECTED NOT DETECTED Final   Coronavirus 229E NOT DETECTED NOT DETECTED Final   Coronavirus HKU1 NOT DETECTED NOT DETECTED Final    Coronavirus NL63 NOT DETECTED NOT DETECTED Final   Coronavirus OC43 NOT DETECTED NOT DETECTED Final   Metapneumovirus NOT DETECTED NOT DETECTED Final   Rhinovirus / Enterovirus NOT DETECTED NOT DETECTED Final   Influenza A NOT DETECTED NOT DETECTED Final   Influenza B NOT DETECTED NOT DETECTED Final   Parainfluenza Virus 1 NOT  DETECTED NOT DETECTED Final   Parainfluenza Virus 2 NOT DETECTED NOT DETECTED Final   Parainfluenza Virus 3 NOT DETECTED NOT DETECTED Final   Parainfluenza Virus 4 NOT DETECTED NOT DETECTED Final   Respiratory Syncytial Virus NOT DETECTED NOT DETECTED Final   Bordetella pertussis NOT DETECTED NOT DETECTED Final   Chlamydophila pneumoniae NOT DETECTED NOT DETECTED Final   Mycoplasma pneumoniae NOT DETECTED NOT DETECTED Final  Culture, bal-quantitative     Status: None   Collection Time: 11/25/17 11:33 AM  Result Value Ref Range Status   Specimen Description BRONCHIAL ALVEOLAR LAVAGE  Final   Special Requests Immunocompromised  Final   Gram Stain   Final    ABUNDANT WBC PRESENT,BOTH PMN AND MONONUCLEAR NO SQUAMOUS EPITHELIAL CELLS SEEN NO ORGANISMS SEEN    Culture NO GROWTH 2 DAYS  Final   Report Status 11/27/2017 FINAL  Final  Acid Fast Smear (AFB)     Status: None   Collection Time: 11/25/17 11:34 AM  Result Value Ref Range Status   AFB Specimen Processing Concentration  Final   Acid Fast Smear Negative  Final    Comment: (NOTE) Performed At: [facility] [place], [place] [id number] Rush Farmer MD UP:[id number]    Source (AFB) BRONCHIAL ALVEOLAR LAVAGE  Final  Pneumocystis smear by DFA     Status: None   Collection Time: 11/25/17 11:36 AM  Result Value Ref Range Status   Specimen Source-[facility] BRONCHIAL ALVEOLAR LAVAGE  Final   Pneumocystis jiroveci Ag NEGATIVE  Final    Michel Bickers, MD [place] [phone] pager   [phone] cell 11/28/2017, 5:42 PM

## 2017-11-28 NOTE — Progress Notes (Addendum)
Pharmacy Antibiotic Note  David [name] is a 76 y.o. male admitted on 11/22/2017 with SOB, cough and hypoxia. Pt continues on unasyn alone with 12/25 discontinuation of linezolid and bactrim per ID.  AKI on CKD4 with SCr remaining above baseline but stable with SCr 4.59.  WBC decreased to 11.7 and remaining afebrile.    Plan: Continue unasyn 3 g IV q 12h F/u renal fxn, C&S, clinical status and ID recommendations   Height: 6' (182.9 cm) Weight: 214 lb 8.1 oz (97.3 kg) IBW/kg (Calculated) : 77.6  Temp (24hrs), Avg:98.6 F (37 C), Min:98.1 F (36.7 C), Max:99.3 F (37.4 C)  Recent Labs  Lab 11/22/17 2230 11/23/17 0327 11/24/17 0335 11/24/17 2045 11/25/17 0009 11/25/17 0611 11/26/17 0610 11/27/17 0500  WBC  --  11.7* 13.9*  --   --  11.2* 12.2* 13.7*  CREATININE 4.90* 4.71* 4.61*  --  4.64* 4.76* 4.58* 4.59*  LATICACIDVEN 1.05  --   --  1.4  --   --   --   --     Estimated Creatinine Clearance: 16.8 mL/min (A) (by C-G formula based on SCr of 4.59 mg/dL (H)).    Allergies  Allergen Reactions  . Codeine Other (See Comments)    Causes constipation  . Cozaar [Losartan Potassium] Other (See Comments)    Causes constipation    12/21 ctx>>12/21 12/21 azithro>> 12/21 12/21 vanc>>12/21 12/21 linezolid>>12/25 12/21 Unasyn >> 12/23 Bactrim>>12/25  12/21 flu: neg 12/21 legionella: NEG 12/21 strep pneumo: neg 12/20 BCx: NGTD 12/21 mrsa pcr: neg 12/22 TA - normal resp flora 12/23 Resp panel - NEG   Bertis Ruddy, PharmD Pharmacy Resident Pager #: [phone] 11/28/2017 10:46 AM

## 2017-11-28 NOTE — Care Management Important Message (Signed)
Important Message  Patient Details  Name: David [name] MRN: [id number] Date of Birth: [birth year]   Medicare Important Message Given:  Yes    Orbie Pyo 11/28/2017, 2:23 PM

## 2017-11-28 NOTE — Progress Notes (Signed)
Spoke with Dr. Lonny Prude by phone.  Patient admitted 12/20 with pneumonia, EF now 20-25% when it was 25-30%.  Patient greatly improved from admission, CHF and pneumonia have both been treated.  No ongoing issues for [place] to address, but will arrange early outpatient follow-up.  Rosaria Ferries, PA-C 11/28/2017 12:18 PM Beeper [phone]

## 2017-11-29 LAB — GLUCOSE, CAPILLARY
GLUCOSE-CAPILLARY: 104 mg/dL — AB (ref 65–99)
GLUCOSE-CAPILLARY: 140 mg/dL — AB (ref 65–99)
Glucose-Capillary: 101 mg/dL — ABNORMAL HIGH (ref 65–99)
Glucose-Capillary: 133 mg/dL — ABNORMAL HIGH (ref 65–99)
Glucose-Capillary: 153 mg/dL — ABNORMAL HIGH (ref 65–99)
Glucose-Capillary: 96 mg/dL (ref 65–99)

## 2017-11-29 LAB — CULTURE, BLOOD (SINGLE): Culture: NO GROWTH

## 2017-11-29 LAB — STREP PNEUMONIAE URINARY ANTIGEN: Strep Pneumo Urinary Antigen: NEGATIVE

## 2017-11-29 MED ORDER — PANTOPRAZOLE SODIUM 40 MG PO TBEC
40.0000 mg | DELAYED_RELEASE_TABLET | Freq: Every day | ORAL | Status: DC
  Administered 2017-11-30 – 2017-12-01 (×2): 40 mg via ORAL
  Filled 2017-11-29 (×2): qty 1

## 2017-11-29 NOTE — Progress Notes (Signed)
Physical Therapy Treatment Patient Details Name: David [name] MRN: [id number] DOB: [birth year] Today's Date: 11/29/2017    History of Present Illness David [name] is a 76 y.o. male with PMH of Anemia, CKD stage IV, HIV (CD4 310 and VL undetectable in October), HLD, HTN, Chronic mixed systolic/diastolic HF (EF 26-37, C5YI with severely reduced systolic dysfunction), prostate CA s/p brachytherapy. He presented with dyspnea and was found to have multilobar pneumonia. He was started on empiric antibiotics but developed respiratory failure requiring intubation. He was extubated successfully on 12/24, required BiPAP for a short while and is now on oxygen via nasal canula.    PT Comments    Pt making excellent progress with functional mobility. He tolerated ambulating ~400' in hallway with use of RW and min guard for safety. All VSS throughout. PT updated recommendations as pt now requiring less assistance and tolerating ambulation with no issues with use of RW. Pt would continue to benefit from skilled physical therapy services at this time while admitted and after d/c to address the below listed limitations in order to improve overall safety and independence with functional mobility.    Follow Up Recommendations  Home health PT;Supervision/Assistance - 24 hour     Equipment Recommendations  3in1 (PT)    Recommendations for Other Services       Precautions / Restrictions Precautions Precautions: Fall Restrictions Weight Bearing Restrictions: No    Mobility  Bed Mobility               General bed mobility comments: pt OOB in recliner chair upon arrival  Transfers Overall transfer level: Needs assistance Equipment used: Rolling walker (2 wheeled) Transfers: Sit to/from Stand Sit to Stand: Min guard         General transfer comment: good technique, min guard for safety  Ambulation/Gait Ambulation/Gait assistance: Min guard Ambulation Distance (Feet): 400  Feet Assistive device: Rolling walker (2 wheeled) Gait Pattern/deviations: Step-through pattern Gait velocity: decreased Gait velocity interpretation: Below normal speed for age/gender General Gait Details: no instability or LOB with use of RW, close min guard for safety. Pt ambulated on RA with SPO2 maintaining >90% throughout   Stairs            Wheelchair Mobility    Modified Rankin (Stroke Patients Only)       Balance Overall balance assessment: Needs assistance Sitting-balance support: No upper extremity supported;Feet supported Sitting balance-Leahy Scale: Good     Standing balance support: Bilateral upper extremity supported;During functional activity Standing balance-Leahy Scale: Poor                              Cognition Arousal/Alertness: Awake/alert Behavior During Therapy: WFL for tasks assessed/performed Overall Cognitive Status: Within Functional Limits for tasks assessed                                        Exercises      General Comments        Pertinent Vitals/Pain Pain Assessment: No/denies pain    Home Living                      Prior Function            PT Goals (current goals can now be found in the care plan section) Acute Rehab PT Goals PT Goal Formulation:  With patient Time For Goal Achievement: 12/12/17 Potential to Achieve Goals: Good Progress towards PT goals: Progressing toward goals    Frequency    Min 3X/week      PT Plan Discharge plan needs to be updated    Co-evaluation              AM-PAC PT "6 Clicks" Daily Activity  Outcome Measure  Difficulty turning over in bed (including adjusting bedclothes, sheets and blankets)?: A Little Difficulty moving from lying on back to sitting on the side of the bed? : A Little Difficulty sitting down on and standing up from a chair with arms (e.g., wheelchair, bedside commode, etc,.)?: A Lot Help needed moving to and from a  bed to chair (including a wheelchair)?: None Help needed walking in hospital room?: A Little Help needed climbing 3-5 steps with a railing? : A Little 6 Click Score: 18    End of Session Equipment Utilized During Treatment: Gait belt Activity Tolerance: Patient tolerated treatment well Patient left: in chair;with call bell/phone within reach;with chair alarm set Nurse Communication: Mobility status PT Visit Diagnosis: Unsteadiness on feet (R26.81);Muscle weakness (generalized) (M62.81)     Time: 8118-8677 PT Time Calculation (min) (ACUTE ONLY): 25 min  Charges:  $Gait Training: 23-37 mins                    G Codes:       [place], [place], [place] 11/29/2017, 4:35 PM

## 2017-11-29 NOTE — Clinical Social Work Note (Signed)
Clinical Social Work Assessment  Patient Details  Name: David [name] MRN: [id number] Date of Birth: 1941-05-17  Date of referral:  11/29/17               Reason for consult:  Facility Placement                Permission sought to share information with:  Facility Art therapist granted to share information::  Yes, Verbal Permission Granted  Name::        Agency::  SNF  Relationship::     Contact Information:     Housing/Transportation Living arrangements for the past 2 months:  Single Family Home Source of Information:  Patient Patient Interpreter Needed:  None Criminal Activity/Legal Involvement Pertinent to Current Situation/Hospitalization:  No - Comment as needed Significant Relationships:  Spouse Lives with:  Spouse Do you feel safe going back to the place where you live?  Yes Need for family participation in patient care:  No (Coment)  Care giving concerns:  Pt lives at home with wife- wife works part time (9am-1pm) but is available the rest of the time.  Patient much weaker than normal at this time and unsure how he would manage at home currently.   Social Worker assessment / plan:  CSW spoke with pt concerning PT recommendation for SNF.  Discussed SNF and SNF referral process.   Employment status:  Retired Nurse, adult PT Recommendations:  [place] / Referral to community resources:  [place]  Patient/Family's Response to care:  Pt hesitant about SNF at this time.  Hopeful that he will improve as he is able to eat more and gain strength with goal of going home.  Realizes that in his current weakness it would be difficult to manage at home so is agreeable to referral being sent out in case he does not improve enough to return home.  Patient/Family's Understanding of and Emotional Response to Diagnosis, Current Treatment, and Prognosis:  Pt very realistic about current state and  optimistic about his recovery process at this time.  Emotional Assessment Appearance:  Appears stated age Attitude/Demeanor/Rapport:    Affect (typically observed):  Appropriate, Pleasant Orientation:  Oriented to Self, Oriented to Place, Oriented to  Time, Oriented to Situation Alcohol / Substance use:  Not Applicable Psych involvement (Current and /or in the community):  No (Comment)  Discharge Needs  Concerns to be addressed:  Care Coordination Readmission within the last 30 days:  No Current discharge risk:  Physical Impairment Barriers to Discharge:  Continued Medical Work up   Jorge Ny, LCSW 11/29/2017, 9:57 AM

## 2017-11-29 NOTE — Telephone Encounter (Signed)
Patient currently admitted

## 2017-11-29 NOTE — Progress Notes (Signed)
PROGRESS NOTE    David [name]  JQB:[id number] DOB: Aug 24, 1941 DOA: 11/22/2017 PCP: [name], NP   Brief Narrative: David [name] is a 76 y.o. male with PMH of Anemia, CKD stage IV, HIV (CD4 62 and VL undetectable in October), HLD, HTN, Chronic mixed systolic/diastolic HF (EF 40-97, D5HG with severely reduced systolic dysfunction), prostate CA s/p brachytherapy. He presented with dyspnea and was found to have multilobar pneumonia. He was started on empiric antibiotics but developed respiratory failure requiring intubation. He was extubated successfully on 12/24, required BiPAP for a short while and is now on oxygen via nasal canula. He has concurrently been treated for a CHF exacerbation and initially treated for PCP, which has been discontinued.   Assessment & Plan:   Principal Problem:   Sepsis due to pneumonia [facility]) Active Problems:   Essential hypertension   Chronic combined systolic and diastolic heart failure (HCC)   Macrocytic anemia   CKD (chronic kidney disease) stage 4, GFR 15-29 ml/min (HCC)   AIDS (HCC)   Acute renal failure superimposed on stage 4 chronic kidney disease ([place])   Community acquired pneumonia of right lung   Aspiration pneumonia of both lower lobes due to regurgitated food ([place])   Multifocal pneumonia Acute hypoxic respiratory failure Patient required intubation on 12/22.   He was successfully extubated on December 24 and transitioned to BiPAP.  He is now on 4 L nasal cannula.  Blood cultures with no growth to date. -Infectious disease recommendations: Discontinue Unasyn -AFB culture pending (smear negative) -wean to room air  HIV AIDS Last CD4 count of 170.  Patient is adherent with therapy.  Patient was previously on Bactrim which has been discontinued per his recommendations. -Infectious disease recommendations: continue Descovy and [place]  Acute on chronic combined systolic and diastolic heart failure Last echocardiogram (20-25%)  significant for a reduced EF of 20-25% with diffuse hypokinesis. Follows with [name]. Plan was for a cardiac CT as an outpatient -Will consult cardiology in setting of heart failure exacerbation and worsened cardiomyopathy. Not on an ACEi/ARB secondary to allergy. -Daily weights/in and out -Continue Coreg -Continue Lasix  ?Pneumocystis  Per infectious disease, unlikely. AFB smear negative. Bactrim discontinued -Discontinue steroid taper  Acute kidney injury on CKD IV-5 Patient follows with Dr. Posey Pronto. He has had two failed fistulas. Creatinine stable in setting of diuresis. Good urine output. Baseline creatinine appears to be 3.6-3.8. -Continue diuresis -If no improvement, will consult nephrology (BMP pending)  Hyperglycemia Secondary to steroids. -Discontinue steroids -SSI  Essential hypertension Slightly uncontrolled. -Continue amlodipine and Coreg   DVT prophylaxis: Heparin Code Status: Full code Family Communication: None at bedside Disposition Plan: Pending medical improvement. PT recommending SNF  Consultants:   PCCM  Infectious disease  Procedures:   ETT (12/22>>12/24  Antimicrobials: Unasyn 12/21 >>  Azithromycin 12/20 > 12/21 Linezolid 12/21 >>12/24 Bactrim 12/23 >12/24   Subjective: Feels good today. No chest pain. Dyspnea improved. On 4L  Objective: Vitals:   11/28/17 2000 11/28/17 2328 11/29/17 0316 11/29/17 0714  BP:  140/85 122/62 140/81  Pulse:    69  Resp:  18  (!) 26  Temp: 99.6 F (37.6 C) 98.2 F (36.8 C) 98.5 F (36.9 C) 98 F (36.7 C)  TempSrc: Axillary Oral Oral Oral  SpO2:  99%  100%  Weight:   92.6 kg (204 lb 2.3 oz)   Height:        Intake/Output Summary (Last 24 hours) at 11/29/2017 0956 Last data filed at 11/29/2017  0600 Gross per 24 hour  Intake 460 ml  Output 2800 ml  Net -2340 ml   Filed Weights   11/26/17 0500 11/27/17 0500 11/29/17 0316  Weight: 100.1 kg (220 lb 10.9 oz) 97.3 kg (214 lb 8.1 oz) 92.6 kg  (204 lb 2.3 oz)    Examination:  General exam: Appears calm and comfortable Respiratory system: Clear to auscultation. Respiratory effort normal. Cardiovascular system: S1 & S2 heard, RRR. No murmurs, rubs, gallops or clicks. Gastrointestinal system: Abdomen is nondistended, soft and nontender. No organomegaly or masses felt. Normal bowel sounds heard. Central nervous system: Alert and oriented. No focal neurological deficits. Extremities: No edema. No calf tenderness Skin: No cyanosis. No rashes Psychiatry: Judgement and insight appear normal. Mood & affect appropriate.     Data Reviewed: I have personally reviewed following labs and imaging studies  CBC: Recent Labs  Lab 11/22/17 2224  11/23/17 0327 11/24/17 0335 11/25/17 0611 11/26/17 0610 11/27/17 0500 11/28/17 1014  WBC 17.1*  --  11.7* 13.9* 11.2* 12.2* 13.7* 11.7*  NEUTROABS 14.5*  --  9.4*  --   --   --   --   --   HGB 12.5*   < > 11.7* 10.8* 11.0* 10.1* 11.0* 11.2*  HCT 39.6   < > 36.1* 32.8* 34.3* 31.1* 33.7* 34.8*  MCV 102.9*  --  100.8* 100.3* 101.8* 101.0* 101.5* 99.7  PLT 206  --  238 210 178 176 214 227   < > = values in this interval not displayed.   Basic Metabolic Panel: Recent Labs  Lab 11/25/17 0009 11/25/17 0611 11/26/17 0610 11/27/17 0500 11/28/17 1014  NA 136 139 140 140 142  K 4.5 4.7 4.4 4.4 4.6  CL 105 109 111 110 106  CO2 21* 21* 21* 21* 23  GLUCOSE 177* 171* 128* 133* 147*  BUN 69* 72* 75* 70* 75*  CREATININE 4.64* 4.76* 4.58* 4.59* 4.79*  CALCIUM 8.1* 8.3* 8.4* 8.6* 9.0  MG  --  2.0 2.1 2.2 2.3  PHOS  --  5.3* 4.8* 4.6 6.1*   GFR: Estimated Creatinine Clearance: 14.6 mL/min (A) (by C-G formula based on SCr of 4.79 mg/dL (H)). Liver Function Tests: Recent Labs  Lab 11/22/17 2224  AST 22  ALT 20  ALKPHOS 104  BILITOT 0.6  PROT 7.5  ALBUMIN 3.7   No results for input(s): LIPASE, AMYLASE in the last 168 hours. No results for input(s): AMMONIA in the last 168  hours. Coagulation Profile: No results for input(s): INR, PROTIME in the last 168 hours. Cardiac Enzymes: Recent Labs  Lab 11/25/17 0611 11/25/17 0816 11/25/17 1950 11/27/17 0144 11/27/17 0500  TROPONINI 0.07* 0.07* 0.05* 0.08* 0.09*   BNP (last 3 results) No results for input(s): PROBNP in the last 8760 hours. HbA1C: No results for input(s): HGBA1C in the last 72 hours. CBG: Recent Labs  Lab 11/28/17 1638 11/28/17 1949 11/28/17 2327 11/29/17 0314 11/29/17 0719  GLUCAP 134* 150* 106* 104* 101*   Lipid Profile: No results for input(s): CHOL, HDL, LDLCALC, TRIG, CHOLHDL, LDLDIRECT in the last 72 hours. Thyroid Function Tests: No results for input(s): TSH, T4TOTAL, FREET4, T3FREE, THYROIDAB in the last 72 hours. Anemia Panel: No results for input(s): VITAMINB12, FOLATE, FERRITIN, TIBC, IRON, RETICCTPCT in the last 72 hours. Sepsis Labs: Recent Labs  Lab 11/22/17 2230 11/24/17 2045 11/25/17 0611 11/26/17 0610  PROCALCITON  --  10.39 32.44 35.40  LATICACIDVEN 1.05 1.4  --   --     Recent Results (from  the past 240 hour(s))  Blood culture (routine x 2)     Status: None   Collection Time: 11/22/17 11:40 PM  Result Value Ref Range Status   Specimen Description BLOOD RIGHT HAND  Final   Special Requests   Final    BOTTLES DRAWN AEROBIC AND ANAEROBIC Blood Culture adequate volume   Culture NO GROWTH 5 DAYS  Final   Report Status 11/28/2017 FINAL  Final  MRSA PCR Screening     Status: None   Collection Time: 11/23/17  6:41 AM  Result Value Ref Range Status   MRSA by PCR NEGATIVE NEGATIVE Final    Comment:        The GeneXpert MRSA Assay (FDA approved for NASAL specimens only), is one component of a comprehensive MRSA colonization surveillance program. It is not intended to diagnose MRSA infection nor to guide or monitor treatment for MRSA infections.   Culture, blood (single)     Status: None (Preliminary result)   Collection Time: 11/24/17  8:45 PM  Result  Value Ref Range Status   Specimen Description BLOOD CENTRAL LINE  Final   Special Requests   Final    IN PEDIATRIC BOTTLE Blood Culture results may not be optimal due to an excessive volume of blood received in culture bottles   Culture NO GROWTH 4 DAYS  Final   Report Status PENDING  Incomplete  Culture, respiratory (NON-Expectorated)     Status: None   Collection Time: 11/24/17  9:00 PM  Result Value Ref Range Status   Specimen Description TRACHEAL ASPIRATE  Final   Special Requests NONE  Final   Gram Stain   Final    MODERATE WBC PRESENT,BOTH PMN AND MONONUCLEAR NO SQUAMOUS EPITHELIAL CELLS SEEN RARE BUDDING YEAST SEEN    Culture RARE Consistent with normal respiratory flora.  Final   Report Status 11/27/2017 FINAL  Final  Respiratory Panel by PCR     Status: None   Collection Time: 11/25/17  2:09 AM  Result Value Ref Range Status   Adenovirus NOT DETECTED NOT DETECTED Final   Coronavirus 229E NOT DETECTED NOT DETECTED Final   Coronavirus HKU1 NOT DETECTED NOT DETECTED Final   Coronavirus NL63 NOT DETECTED NOT DETECTED Final   Coronavirus OC43 NOT DETECTED NOT DETECTED Final   Metapneumovirus NOT DETECTED NOT DETECTED Final   Rhinovirus / Enterovirus NOT DETECTED NOT DETECTED Final   Influenza A NOT DETECTED NOT DETECTED Final   Influenza B NOT DETECTED NOT DETECTED Final   Parainfluenza Virus 1 NOT DETECTED NOT DETECTED Final   Parainfluenza Virus 2 NOT DETECTED NOT DETECTED Final   Parainfluenza Virus 3 NOT DETECTED NOT DETECTED Final   Parainfluenza Virus 4 NOT DETECTED NOT DETECTED Final   Respiratory Syncytial Virus NOT DETECTED NOT DETECTED Final   Bordetella pertussis NOT DETECTED NOT DETECTED Final   Chlamydophila pneumoniae NOT DETECTED NOT DETECTED Final   Mycoplasma pneumoniae NOT DETECTED NOT DETECTED Final  Culture, bal-quantitative     Status: None   Collection Time: 11/25/17 11:33 AM  Result Value Ref Range Status   Specimen Description BRONCHIAL ALVEOLAR  LAVAGE  Final   Special Requests Immunocompromised  Final   Gram Stain   Final    ABUNDANT WBC PRESENT,BOTH PMN AND MONONUCLEAR NO SQUAMOUS EPITHELIAL CELLS SEEN NO ORGANISMS SEEN    Culture NO GROWTH 2 DAYS  Final   Report Status 11/27/2017 FINAL  Final  Acid Fast Smear (AFB)     Status: None   Collection Time:  11/25/17 11:34 AM  Result Value Ref Range Status   AFB Specimen Processing Concentration  Final   Acid Fast Smear Negative  Final    Comment: (NOTE) Performed At: [facility] [place], [place] [id number] Rush Farmer MD SW:[id number]    Source (AFB) BRONCHIAL ALVEOLAR LAVAGE  Final  Pneumocystis smear by DFA     Status: None   Collection Time: 11/25/17 11:36 AM  Result Value Ref Range Status   Specimen Source-[facility] BRONCHIAL ALVEOLAR LAVAGE  Final   Pneumocystis jiroveci Ag NEGATIVE  Final         Radiology Studies: Dg Chest Port 1 View  Result Date: 11/28/2017 CLINICAL DATA:  76 year old male status post bilateral pneumonia. Shortness of breath. EXAM: PORTABLE CHEST 1 VIEW COMPARISON:  11/27/2017 and earlier. FINDINGS: Portable AP semi upright view at 0651 hours stable right IJ central line. Regressed course, interstitial, and indistinct pulmonary opacity since yesterday. Left lung ventilation now approaching normal. Mild residual indistinct right lung opacity. Stable cardiomegaly and mediastinal contours. Visualized tracheal air column is within normal limits. No pneumothorax or pleural effusion. Stable visualized osseous structures. IMPRESSION: 1. Stable right IJ central line. 2. Regressed bilateral pneumonia with improving bilateral ventilation, near normal on the left. 3. No new cardiopulmonary abnormality. Electronically Signed   By: Genevie Ann M.D.   On: 11/28/2017 07:53        Scheduled Meds: . amLODipine  10 mg Oral Daily  . aspirin  81 mg Per Tube Daily  . brimonidine  1 drop Left Eye BID  . carvedilol  25 mg Per Tube BID WC  .  chlorhexidine gluconate (MEDLINE KIT)  15 mL Mouth Rinse BID  . Chlorhexidine Gluconate Cloth  6 each Topical Q0600  . dolutegravir  50 mg Oral Daily  . dorzolamide-timolol  1 drop Left Eye BID  . emtricitabine-tenofovir AF  1 tablet Per Tube Daily  . furosemide  40 mg Intravenous Q12H  . heparin  5,000 Units Subcutaneous Q8H  . insulin aspart  0-15 Units Subcutaneous Q4H  . latanoprost  1 drop Left Eye QHS  . mouth rinse  15 mL Mouth Rinse QID  . pantoprazole (PROTONIX) IV  40 mg Intravenous Q24H  . sodium chloride flush  3 mL Intravenous Q12H   Continuous Infusions: . sodium chloride 250 mL (11/26/17 1900)  . sodium chloride       LOS: 6 days     Cordelia Poche, MD [facility] 11/29/2017, 9:56 AM Pager: [phone]  If 7PM-7AM, please contact night-coverage [email or web address] Password [facility] 11/29/2017, 9:56 AM

## 2017-11-29 NOTE — NC FL2 (Signed)
[place] MEDICAID FL2 LEVEL OF CARE SCREENING TOOL     IDENTIFICATION  Patient Name: David [name] Birthdate: [birth year] Sex: male Admission Date (Current Location): 11/22/2017  [facility] and [place] Number:  Herbalist and Address:  [facility] [place], [place] [address], [place], [place] [address]      Provider Number: [id number]  Attending Physician Name and Address:  Mariel Aloe, MD  Relative Name and Phone Number:       Current Level of Care: Hospital Recommended Level of Care: [place] Prior Approval Number:    Date Approved/Denied:   PASRR Number: [id number]  Discharge Plan: SNF    Current Diagnoses: Patient Active Problem List   Diagnosis Date Noted  . Community acquired pneumonia of right lung   . Aspiration pneumonia of both lower lobes due to regurgitated food ([place])   . Sepsis due to pneumonia ([place]) 11/22/2017  . Acute renal failure superimposed on stage 4 chronic kidney disease ([place]) 11/22/2017  . 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 AIDS ([place]) 05/14/2014  . 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 ARF (acute renal failure) ([place]) 05/06/2014  . Gastroparesis 05/06/2014  . Protein-calorie malnutrition, severe ([place]) 05/05/2014  . Severe malnutrition ([place]) 05/04/2014  . CKD (chronic kidney disease) stage 4, GFR 15-29 ml/min (HCC) 05/02/2014  . Hypotension 05/02/2014  . Loss of weight 04/29/2014  . Conjunctivitis 04/29/2014  . 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 Renal insufficiency 04/18/2013  . Low back pain 04/09/2012  . Osteoarthritis of hip 12/29/2011  . Iron deficiency anemia 06/28/2011  . Macrocytic anemia 04/02/2011  . 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 Hyperlipidemia 11/30/2010  . Essential hypertension 11/30/2010  . Chronic combined systolic and diastolic heart failure ([place]) 11/30/2010  . Transient cerebral ischemia 11/30/2010  . HYPERGLYCEMIA 11/30/2010     Orientation RESPIRATION BLADDER Height & Weight     Self, Time, Situation, Place  O2(4L [place]) Incontinent, Indwelling catheter Weight: 204 lb 2.3 oz (92.6 kg) Height:  6' (182.9 cm)  BEHAVIORAL SYMPTOMS/MOOD NEUROLOGICAL BOWEL NUTRITION STATUS      Continent Diet(see DC summary)  AMBULATORY STATUS COMMUNICATION OF NEEDS Skin   Extensive Assist Verbally Normal                       Personal Care Assistance Level of Assistance  Bathing, Dressing Bathing Assistance: Maximum assistance   Dressing Assistance: Maximum assistance     Functional Limitations Info             SPECIAL CARE FACTORS FREQUENCY  PT (By licensed PT), OT (By licensed OT)     PT Frequency: 5/wk OT Frequency: 5/wk            Contractures      Additional Factors Info  Code Status, Allergies Code Status Info: FULL Allergies Info: Codeine, Cozaar Losartan Potassium           Current Medications (11/29/2017):  This is the current hospital active medication list Current Facility-Administered Medications  Medication Dose Route Frequency Provider Last Rate Last Dose  . 0.9 %  sodium chloride infusion  250 mL Intravenous PRN Opyd, Ilene Qua, MD 10 mL/hr at 11/26/17 1900 250 mL at 11/26/17 1900  . 0.9 %  sodium chloride infusion  250 mL Intravenous PRN Omar Person, NP      . amLODipine (NORVASC) tablet 10 mg  10 mg Oral Daily Mannam, Praveen, MD   10 mg at 11/29/17  8875  . aspirin chewable tablet 81 mg  81 mg Per Tube Daily Mannam, Praveen, MD   81 mg at 11/29/17 0843  . brimonidine (ALPHAGAN) 0.15 % ophthalmic solution 1 drop  1 drop Left Eye BID Opyd, Ilene Qua, MD   1 drop at 11/29/17 0858  . carvedilol (COREG) tablet 25 mg  25 mg Per Tube BID WC Mannam, Praveen, MD   25 mg at 11/29/17 0842  . chlorhexidine gluconate (MEDLINE KIT) (PERIDEX) 0.12 % solution 15 mL  15 mL Mouth Rinse BID Mannam, Praveen, MD   15 mL at 11/29/17 0842  . Chlorhexidine Gluconate Cloth 2 % PADS 6 each  6 each  Topical Q0600 Marshell Garfinkel, MD   6 each at 11/29/17 0549  . dolutegravir (TIVICAY) tablet 50 mg  50 mg Oral Daily Opyd, Ilene Qua, MD   50 mg at 11/29/17 0846  . dorzolamide-timolol (COSOPT) 22.3-6.8 MG/ML ophthalmic solution 1 drop  1 drop Left Eye BID Opyd, Ilene Qua, MD   1 drop at 11/29/17 0859  . emtricitabine-tenofovir AF (DESCOVY) 200-25 MG per tablet 1 tablet  1 tablet Per Tube Daily Mannam, Praveen, MD   1 tablet at 11/29/17 0847  . fentaNYL (SUBLIMAZE) injection 50 mcg  50 mcg Intravenous Q15 min PRN Omar Person, NP      . fentaNYL (SUBLIMAZE) injection 50 mcg  50 mcg Intravenous Q2H PRN Omar Person, NP      . furosemide (LASIX) injection 40 mg  40 mg Intravenous Q12H Mannam, Praveen, MD   40 mg at 11/29/17 0843  . heparin injection 5,000 Units  5,000 Units Subcutaneous Q8H Opyd, Ilene Qua, MD   5,000 Units at 11/29/17 0549  . hydrALAZINE (APRESOLINE) injection 10 mg  10 mg Intravenous Q4H PRN Vianne Bulls, MD   10 mg at 11/24/17 1823  . insulin aspart (novoLOG) injection 0-15 Units  0-15 Units Subcutaneous Q4H Marshell Garfinkel, MD   2 Units at 11/28/17 2032  . latanoprost (XALATAN) 0.005 % ophthalmic solution 1 drop  1 drop Left Eye QHS Opyd, Ilene Qua, MD   1 drop at 11/28/17 2157  . MEDLINE mouth rinse  15 mL Mouth Rinse QID Mannam, Praveen, MD   15 mL at 11/29/17 0330  . pantoprazole (PROTONIX) injection 40 mg  40 mg Intravenous Q24H Omar Person, NP   40 mg at 11/29/17 0847  . sodium chloride flush (NS) 0.9 % injection 3 mL  3 mL Intravenous Q12H Opyd, Ilene Qua, MD   3 mL at 11/29/17 0858  . sodium chloride flush (NS) 0.9 % injection 3 mL  3 mL Intravenous PRN Opyd, Ilene Qua, MD         Discharge Medications: Please see discharge summary for a list of discharge medications.  Relevant Imaging Results:  Relevant Lab Results:   Additional Information SS#: [id number]  Jorge Ny, LCSW

## 2017-11-30 DIAGNOSIS — N179 Acute kidney failure, unspecified: Secondary | ICD-10-CM

## 2017-11-30 DIAGNOSIS — R0602 Shortness of breath: Secondary | ICD-10-CM

## 2017-11-30 DIAGNOSIS — D539 Nutritional anemia, unspecified: Secondary | ICD-10-CM

## 2017-11-30 DIAGNOSIS — J690 Pneumonitis due to inhalation of food and vomit: Secondary | ICD-10-CM

## 2017-11-30 DIAGNOSIS — I10 Essential (primary) hypertension: Secondary | ICD-10-CM

## 2017-11-30 LAB — CBC
HCT: 35.9 % — ABNORMAL LOW (ref 39.0–52.0)
HEMOGLOBIN: 11.5 g/dL — AB (ref 13.0–17.0)
MCH: 32.7 pg (ref 26.0–34.0)
MCHC: 32 g/dL (ref 30.0–36.0)
MCV: 102 fL — AB (ref 78.0–100.0)
PLATELETS: 201 10*3/uL (ref 150–400)
RBC: 3.52 MIL/uL — AB (ref 4.22–5.81)
RDW: 13.9 % (ref 11.5–15.5)
WBC: 6.7 10*3/uL (ref 4.0–10.5)

## 2017-11-30 LAB — RENAL FUNCTION PANEL
ALBUMIN: 3 g/dL — AB (ref 3.5–5.0)
Anion gap: 9 (ref 5–15)
BUN: 81 mg/dL — ABNORMAL HIGH (ref 6–20)
CHLORIDE: 105 mmol/L (ref 101–111)
CO2: 26 mmol/L (ref 22–32)
CREATININE: 4.95 mg/dL — AB (ref 0.61–1.24)
Calcium: 8.8 mg/dL — ABNORMAL LOW (ref 8.9–10.3)
GFR calc Af Amer: 12 mL/min — ABNORMAL LOW (ref >60)
GFR calc non Af Amer: 10 mL/min — ABNORMAL LOW (ref >60)
Glucose, Bld: 111 mg/dL — ABNORMAL HIGH (ref 65–99)
Phosphorus: 5.7 mg/dL — ABNORMAL HIGH (ref 2.5–4.6)
Potassium: 4.4 mmol/L (ref 3.5–5.1)
SODIUM: 140 mmol/L (ref 135–145)

## 2017-11-30 LAB — GLUCOSE, CAPILLARY
GLUCOSE-CAPILLARY: 103 mg/dL — AB (ref 65–99)
GLUCOSE-CAPILLARY: 104 mg/dL — AB (ref 65–99)
GLUCOSE-CAPILLARY: 115 mg/dL — AB (ref 65–99)
GLUCOSE-CAPILLARY: 123 mg/dL — AB (ref 65–99)
GLUCOSE-CAPILLARY: 143 mg/dL — AB (ref 65–99)
GLUCOSE-CAPILLARY: 193 mg/dL — AB (ref 65–99)

## 2017-11-30 LAB — LEGIONELLA PNEUMOPHILA SEROGP 1 UR AG: L. pneumophila Serogp 1 Ur Ag: NEGATIVE

## 2017-11-30 MED ORDER — FUROSEMIDE 10 MG/ML IJ SOLN: 40.0000 mg | Freq: Every day | INTRAMUSCULAR | Status: DC

## 2017-11-30 MED ORDER — CHLORHEXIDINE GLUCONATE 0.12 % MT SOLN
OROMUCOSAL | Status: AC
  Administered 2017-11-30: 21:00:00
  Filled 2017-11-30: qty 15

## 2017-11-30 NOTE — Progress Notes (Signed)
Physical Therapy Treatment Patient Details Name: David [name] MRN: [id number] DOB: 10/06/1941 Today's Date: 11/30/2017    History of Present Illness David [name] is a 76 y.o. male with PMH of Anemia, CKD stage IV, HIV (CD4 310 and VL undetectable in October), HLD, HTN, Chronic mixed systolic/diastolic HF (EF 66-59, D3TT with severely reduced systolic dysfunction), prostate CA s/p brachytherapy. He presented with dyspnea and was found to have multilobar pneumonia. He was started on empiric antibiotics but developed respiratory failure requiring intubation. He was extubated successfully on 12/24, required BiPAP for a short while and is now on oxygen via nasal canula.    PT Comments    Pt admitted with above diagnosis. Pt currently with functional limitations due to balance and endurance deficits. Pt was able to ambulate on unit with RW with overall good cadence and good safety.  Will continue PT.   Pt will benefit from skilled PT to increase their independence and safety with mobility to allow discharge to the venue listed below.     Follow Up Recommendations  Home health PT;Supervision/Assistance - 24 hour     Equipment Recommendations  3in1 (PT)    Recommendations for Other Services       Precautions / Restrictions Precautions Precautions: Fall Restrictions Weight Bearing Restrictions: No    Mobility  Bed Mobility Overal bed mobility: Needs Assistance Bed Mobility: Supine to Sit     Supine to sit: Min assist     General bed mobility comments: Needed incr time and a little assist due to air deflated and pt in "hole".  needed a little assist to get out of hole.  Transfers Overall transfer level: Needs assistance Equipment used: Rolling walker (2 wheeled) Transfers: Sit to/from Stand Sit to Stand: Min guard Stand pivot transfers: Min guard       General transfer comment: good technique, min guard for safety  Ambulation/Gait Ambulation/Gait assistance: Min  guard Ambulation Distance (Feet): 840 Feet Assistive device: Rolling walker (2 wheeled) Gait Pattern/deviations: Step-through pattern Gait velocity: decreased Gait velocity interpretation: Below normal speed for age/gender General Gait Details: no instability or LOB with use of RW, close min guard for safety. Pt ambulated on RA with SPO2 maintaining >90% throughout.  Pt did need cues to stay close to RW as he tends to walk behind RW.    Stairs            Wheelchair Mobility    Modified Rankin (Stroke Patients Only)       Balance Overall balance assessment: Needs assistance Sitting-balance support: No upper extremity supported;Feet supported Sitting balance-Leahy Scale: Good     Standing balance support: Bilateral upper extremity supported;During functional activity Standing balance-Leahy Scale: Poor Standing balance comment: relies on UE support.                             Cognition Arousal/Alertness: Awake/alert Behavior During Therapy: WFL for tasks assessed/performed Overall Cognitive Status: Within Functional Limits for tasks assessed                                        Exercises      General Comments        Pertinent Vitals/Pain Pain Assessment: No/denies pain   VSS Home Living  Prior Function            PT Goals (current goals can now be found in the care plan section) Acute Rehab PT Goals Patient Stated Goal: to get better Progress towards PT goals: Progressing toward goals    Frequency    Min 3X/week      PT Plan Current plan remains appropriate    Co-evaluation              AM-PAC PT "6 Clicks" Daily Activity  Outcome Measure  Difficulty turning over in bed (including adjusting bedclothes, sheets and blankets)?: A Little Difficulty moving from lying on back to sitting on the side of the bed? : A Little Difficulty sitting down on and standing up from a chair with  arms (e.g., wheelchair, bedside commode, etc,.)?: A Little Help needed moving to and from a bed to chair (including a wheelchair)?: None Help needed walking in hospital room?: A Little Help needed climbing 3-5 steps with a railing? : A Little 6 Click Score: 19    End of Session Equipment Utilized During Treatment: Gait belt Activity Tolerance: Patient tolerated treatment well Patient left: in chair;with call bell/phone within reach;with chair alarm set Nurse Communication: Mobility status PT Visit Diagnosis: Unsteadiness on feet (R26.81);Muscle weakness (generalized) (M62.81)     Time: 5809-9833 PT Time Calculation (min) (ACUTE ONLY): 27 min  Charges:  $Gait Training: 23-37 mins                    G Codes:       Nancey Kreitz,PT Acute Rehabilitation [phone]   Denice Paradise 11/30/2017, 1:23 PM

## 2017-11-30 NOTE — Progress Notes (Signed)
Patient preferred to go home and is now doing too well to qualify for SNF through insurance  CSW signing off  Jorge Ny, [place] Social Worker [phone]

## 2017-11-30 NOTE — Consult Note (Signed)
[facility] Consult Note     Date: 11/30/2017                  Patient Name:  David [name]  MRN: [id number]  DOB: [birth year]  Age / Sex: 76 y.o., male         PCP: [name], NP                 Service Requesting Consult: Dr. Ree Kida                 Reason for Consult: Acute kidney injury superimposed on CKD            Chief Complaint: dyspnea  HPI: Pt is a 45M with a PMH significant for CKD IV followed by Dr. 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 Ree Kida for evaluation and recommendations surrounding AKI on CKD.  Pt was admitted to the hospital 12/20 for dyspnea and was found to have multilobar pneumonia.  He required intubation for respiratory distress 12/22-12/24.  He was extubated to BiPaP.  He has completed Unasyn.  He also has been treated for a CHF exacerbation and is on Lasix 40 IV daily.    Last Cr when he saw Dr. Posey Pronto 09/2017 was 4.5.  Since admission, his Cr has been between 4.5-4.9.  He has not been oliguric.  Home Lasix dose is 80 mg PO daily.  He has had 2 failed accesses.  Today, he feels better than when he arrived.  He denies f/c, n/v, CP.  SOB better.  No LE edema.   Weights are down since admission.    Past Medical History:  Diagnosis Date  . Anemia, iron deficiency 11/15/2011  . Arthritis   . Cancer [facility])    hx of prostate; s/p radioactive seed implant 10/2009 Dr Janice Norrie  . Chronic kidney disease   . Glaucoma   . Heart murmur    "mild" per pt  . History of cardiac cath 2004   negative for CAD  . History of myocardial perfusion scan 02/2010   negative for coronary insufficiency (LVEF 27%)  . HIV infection ([place])   . Hyperlipidemia   . Hypertension    followed by [facility] and Vascular (Dr Dani Gobble Croitoru)  . Nonischemic cardiomyopathy ([place])   . Sinus  bradycardia   . Stroke [facility])    "mini stroke" years ago  . Systolic and diastolic CHF, acute ([place]) 06/2010   felt to be secondary to hypertensive cardiomyopathy    Past Surgical History:  Procedure Laterality Date  . AV FISTULA PLACEMENT Right 10/13/2016   Procedure: ARTERIOVENOUS (AV) FISTULA CREATION;  Surgeon: [name], MD;  Location: [place];  Service: Vascular;  Laterality: Right;  . BASCILIC VEIN TRANSPOSITION Left 06/24/2015   Procedure: BASILIC VEIN TRANSPOSITION;  Surgeon: Mal Misty, MD;  Location: [place];  Service: Vascular;  Laterality: Left;  . CARDIAC CATHETERIZATION  02/04/2003   mildly depressed LV systolic fx EF 59%,DGLOVF coronaries/abdominal aorta/renal arteries.  . COLONOSCOPY    . ESOPHAGOGASTRODUODENOSCOPY (EGD) WITH PROPOFOL N/A 05/05/2014   Procedure: ESOPHAGOGASTRODUODENOSCOPY (EGD) WITH PROPOFOL;  Surgeon: Missy Sabins, MD;  Location: [facility];  Service: Endoscopy;  Laterality: N/A;  . EYE SURGERY Bilateral    cataract surgery   . EYE SURGERY Bilateral    glaucoma surgery  .  NM MYOCAR PERF WALL MOTION  02/21/2010   normal  . RADIOACTIVE SEED IMPLANT  2010   prostate cancer  . US ECHOCARDIOGRAPHY  02/06/2012   mild LVH,LA mod. dilated,mild-mod. 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 AI.    Family History  Problem Relation Age of Onset  . Hypertension Mother   . Thyroid disease Mother   . Cholelithiasis Daughter   . Cholelithiasis Son   . Hypertension Maternal Grandmother   . Diabetes Maternal Grandmother   . Heart attack Neg Hx   . Hyperlipidemia Neg Hx    Social History:  reports that he quit smoking about 11 years ago. he has never used smokeless tobacco. He reports that he drinks alcohol. He reports that he does not use drugs.  Allergies:  Allergies  Allergen Reactions  . Codeine Other (See Comments)    Causes constipation  . Cozaar [Losartan Potassium] Other (See Comments)    Causes constipation      Medications Prior to Admission  Medication Sig Dispense Refill  . acetaminophen (TYLENOL) 500 MG tablet Take 500 mg by mouth every 4 (four) hours as needed (for muscle pain).     Marland Kitchen amLODipine (NORVASC) 10 MG tablet TAKE 1 TABLET EVERY DAY (Patient taking differently: Take 10 mg by mouth once a day) 90 tablet 3  . aspirin 325 MG EC tablet Take 325 mg by mouth every evening.     . brimonidine (ALPHAGAN) 0.15 % ophthalmic solution Place 1 drop into the left eye 2 (two) times daily.   1  . dolutegravir (TIVICAY) 50 MG tablet Take 1 tablet (50 mg total) by mouth daily. 30 tablet 11  . dorzolamide-timolol (COSOPT) 22.3-6.8 MG/ML ophthalmic solution Place 1 drop into the left eye 2 (two) times daily.     Marland Kitchen emtricitabine-tenofovir AF (DESCOVY) 200-25 MG tablet Take 1 tablet by mouth daily. 30 tablet 11  . furosemide (LASIX) 40 MG tablet Take 2 tablets (80 mg total) by mouth daily. (Patient taking differently: Take 80 mg by mouth every morning. ) 180 tablet 3  . latanoprost (XALATAN) 0.005 % ophthalmic solution Place 1 drop into the left eye at bedtime.     . ranitidine (ZANTAC) 150 MG tablet Take 1 tablet (150 mg total) by mouth 2 (two) times daily. 60 tablet 2  . Vitamin D, Ergocalciferol, (DRISDOL) 50000 UNITS CAPS capsule Take 50,000 units by mouth once a week on Saturdays  2  . carvedilol (COREG) 25 MG tablet TAKE 1 TABLET TWICE DAILY (Patient taking differently: Take 25 mg by mouth two times a day) 180 tablet 3    Results for orders placed or performed during the hospital encounter of 11/22/17 (from the past 48 hour(s))  Glucose, capillary     Status: Abnormal   Collection Time: 11/28/17  4:38 PM  Result Value Ref Range   Glucose-Capillary 134 (H) 65 - 99 mg/dL  Glucose, capillary     Status: Abnormal   Collection Time: 11/28/17  7:49 PM  Result Value Ref Range   Glucose-Capillary 150 (H) 65 - 99 mg/dL   Comment 1 Notify RN    Comment 2 Document in Chart   Glucose, capillary     Status:  Abnormal   Collection Time: 11/28/17 11:27 PM  Result Value Ref Range   Glucose-Capillary 106 (H) 65 - 99 mg/dL   Comment 1 Notify RN    Comment 2 Document in Chart   Glucose, capillary     Status: Abnormal  Collection Time: 11/29/17  3:14 AM  Result Value Ref Range   Glucose-Capillary 104 (H) 65 - 99 mg/dL   Comment 1 Notify RN    Comment 2 Document in Chart   Legionella Pneumophila Serogp 1 Ur Ag     Status: None   Collection Time: 11/29/17  6:32 AM  Result Value Ref Range   L. pneumophila Serogp 1 Ur Ag Negative Negative    Comment: (NOTE) Presumptive negative for L. pneumophila serogroup 1 antigen in urine, suggesting no recent or current infection. Legionnaires' disease cannot be ruled out since other serogroups and species may also cause disease. Performed At: [facility] [place], [place] [id number] Rush Farmer MD [id number]    Source of Sample URINE, RANDOM   Strep pneumoniae urinary antigen     Status: None   Collection Time: 11/29/17  6:36 AM  Result Value Ref Range   Strep Pneumo Urinary Antigen NEGATIVE NEGATIVE    Comment:        Infection due to S. pneumoniae cannot be absolutely ruled out since the antigen present may be below the detection limit of the test.   Glucose, capillary     Status: Abnormal   Collection Time: 11/29/17  7:19 AM  Result Value Ref Range   Glucose-Capillary 101 (H) 65 - 99 mg/dL  Glucose, capillary     Status: Abnormal   Collection Time: 11/29/17 12:25 PM  Result Value Ref Range   Glucose-Capillary 140 (H) 65 - 99 mg/dL  Glucose, capillary     Status: Abnormal   Collection Time: 11/29/17  3:45 PM  Result Value Ref Range   Glucose-Capillary 133 (H) 65 - 99 mg/dL  Glucose, capillary     Status: Abnormal   Collection Time: 11/29/17  8:17 PM  Result Value Ref Range   Glucose-Capillary 153 (H) 65 - 99 mg/dL  Glucose, capillary     Status: None   Collection Time: 11/29/17 11:15 PM  Result Value Ref  Range   Glucose-Capillary 96 65 - 99 mg/dL  Glucose, capillary     Status: Abnormal   Collection Time: 11/30/17  3:44 AM  Result Value Ref Range   Glucose-Capillary 115 (H) 65 - 99 mg/dL  Renal function panel     Status: Abnormal   Collection Time: 11/30/17  5:09 AM  Result Value Ref Range   Sodium 140 135 - 145 mmol/L   Potassium 4.4 3.5 - 5.1 mmol/L   Chloride 105 101 - 111 mmol/L   CO2 26 22 - 32 mmol/L   Glucose, Bld 111 (H) 65 - 99 mg/dL   BUN 81 (H) 6 - 20 mg/dL   Creatinine, Ser 4.95 (H) 0.61 - 1.24 mg/dL   Calcium 8.8 (L) 8.9 - 10.3 mg/dL   Phosphorus 5.7 (H) 2.5 - 4.6 mg/dL   Albumin 3.0 (L) 3.5 - 5.0 g/dL   GFR calc non Af Amer 10 (L) >60 mL/min   GFR calc Af Amer 12 (L) >60 mL/min    Comment: (NOTE) The eGFR has been calculated using the CKD EPI equation. This calculation has not been validated in all clinical situations. eGFR's persistently <60 mL/min signify possible Chronic Kidney Disease.    Anion gap 9 5 - 15  CBC     Status: Abnormal   Collection Time: 11/30/17  5:09 AM  Result Value Ref Range   WBC 6.7 4.0 - 10.5 K/uL   RBC 3.52 (L) 4.22 - 5.81 MIL/uL   Hemoglobin 11.5 (L)  13.0 - 17.0 g/dL   HCT 35.9 (L) 39.0 - 52.0 %   MCV 102.0 (H) 78.0 - 100.0 fL   MCH 32.7 26.0 - 34.0 pg   MCHC 32.0 30.0 - 36.0 g/dL   RDW 13.9 11.5 - 15.5 %   Platelets 201 150 - 400 K/uL  Glucose, capillary     Status: Abnormal   Collection Time: 11/30/17  7:36 AM  Result Value Ref Range   Glucose-Capillary 123 (H) 65 - 99 mg/dL  Glucose, capillary     Status: Abnormal   Collection Time: 11/30/17 11:45 AM  Result Value Ref Range   Glucose-Capillary 143 (H) 65 - 99 mg/dL   No results found.  ROS: all other systems reviewed and are negative except as per HPI  Blood pressure 139/79, pulse 78, temperature (!) 97.5 F (36.4 C), temperature source Oral, resp. rate 20, height 6' (1.829 m), weight 92.6 kg (204 lb 2.3 oz), SpO2 99 %. Physical Exam   GEN NAD, lying in bed HEENT  EOMI, PERRL NECK no overt JVD PULM clear to ascultation anteriorly, some faint bibasilar crackles CV RRR soft systolic murmur ABD nontender EXT trace LE edema NEURO AAO x 3 no asterixis  Assessment/Plan  1.  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 very mild elevation in Cr from baseline of 4.5.  He is nonoliguric and does not have any indication for dialysis.  Would avoid NSAIDS IV contrast fleets enemas and mag citrate.  He does not have any indications for HD at present.  He has 2 failed accesses and the next option would be a graft.  His elevation in Cr is likely septic mediated AKI +/- effect from Bactrim which should resolve with its discontinuation as has already occurred.  I don't expect him to need HD during this admission.  2.  Acute hypoxic respiratory failure: required intubation and extubated to bipap, now on [place] O2.  S/p empiric antibiotics.  PCP to be unlikely.  Bactrim has been stopped.  3.  HIV: VL well controlled, continuing descovy and Tivicay.  Of note, descovy should be used with extreme caution with eGFR < 30.  Would defer to pt's ID provider for med changes.    4.  Acute on chronic combined systolic and diastolic CHF: Last OP weight 97.5 kg; now below this weight at 92.6.  On coreg and lasix.  Brisk diuresis, changed to 40 IV daily.   Madelon Lips, MD [facility] pgr [phone] 11/30/2017, 2:16 PM

## 2017-11-30 NOTE — Telephone Encounter (Signed)
Still admitted-Currently Admitted (since 11/22/2017)

## 2017-11-30 NOTE — Progress Notes (Signed)
PROGRESS NOTE    David [name]  NID:[id number] DOB: 1941-07-31 DOA: 11/22/2017 PCP: [name], NP   Chief Complaint  Patient presents with  . Respiratory Distress    Brief Narrative:  David [name] is a 76 y.o. 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 He presented with dyspnea and was found to have multilobar pneumonia. He was started on empiric antibiotics but developed respiratory failure requiring intubation. He was extubated successfully on 12/24, required BiPAP for a short while and is now on oxygen via nasal canula. He has concurrently been treated for a CHF exacerbation and initially treated for PCP, which has been discontinued.  Assessment & Plan   Multifocal pneumonia/Acute hypoxic respiratory failure -Patient required intubation on 12/22 and successfully extubated on 12/24. -Transitioned to BiPAP and weaned to room air -Placed on several antibiotics and completed 5 days of unasyn -blood cultures show no growth to date    -AFB culture pending, smear negative  HIV/AIDS -Last CD4 count of 170.  -infectious disease consulted and appreciated -linezolid and Bactrim discontinued -Continue descovy and tivicay -outpatient follow up 12/31/2017  Acute on chronic combined systolic and diastolic heart failure -Echocardiogram showed EF 08-67%, grade 2 diastolic dysfunction  -Follows with [name]. Plan was for a cardiac CT as an outpatient -Not on an ACEi/ARB secondary to allergy. -Continue lasix and coreg -will decrease lasix amount and continue to monitor intake/output, daily weights -Urine output over past 24 hours 3800cc  ?Pneumocystis  -Per infectious disease, unlikely. AFB smear negative. Bactrim discontinued -Discontinue steroid taper  Acute kidney injury on CKD, stage  IV-V -Patient follows with Dr. Posey Pronto. He has had two failed fistulas.  -baseline creatinine 3.6-3.8 -Currently 4.95 -Nephrology consulted and appreciated -Continue to monitor closely given CHF exacerbation and diuresis  Hyperglycemia -Secondary to steroids, which have been discontinued -Continue insulin sliding scale and CBG monitoring   Essential hypertension -Continue amlodipine and Coreg  DVT Prophylaxis  Heparin  Code Status: Full  Family Communication: None at bedside  Disposition Plan: Admitted. Patient not wanting to go to SNF.Home when stable.   Consultants PCCM Infectious disease  Procedures  Intubation/extubation 12/22-12/24/2018   Antibiotics   Anti-infectives (From admission, onward)   Start     Dose/Rate Route Frequency Ordered Stop   11/26/17 1000  emtricitabine-tenofovir AF (DESCOVY) 200-25 MG per tablet 1 tablet     1 tablet Per Tube Daily 11/26/17 0951     11/25/17 1130  sulfamethoxazole-trimethoprim (BACTRIM) 485 mg of trimethoprim in dextrose 5 % 500 mL IVPB  Status:  Discontinued     485 mg of trimethoprim 353.5 mL/hr over 90 Minutes Intravenous Every 12 hours 11/25/17 1040 11/27/17 1436   11/25/17 0600  Ampicillin-Sulbactam (UNASYN) 3 g in sodium chloride 0.9 % 100 mL IVPB  Status:  Discontinued     3 g 200 mL/hr over 30 Minutes Intravenous Every 12 hours 11/24/17 2001 11/28/17 1746   11/24/17 2200  vancomycin (VANCOCIN) IVPB 1000 mg/200 mL premix  Status:  Discontinued     1,000 mg 200 mL/hr over 60 Minutes Intravenous Every 48 hours 11/23/17 0143 11/23/17 1040   11/24/17 2100  piperacillin-tazobactam (ZOSYN) IVPB 2.25 g  Status:  Discontinued     2.25 g 100 mL/hr over 30 Minutes Intravenous Every 8 hours 11/24/17 1956 11/24/17 2000   11/24/17 2100  levofloxacin (  LEVAQUIN) IVPB 500 mg  Status:  Discontinued     500 mg 100 mL/hr over 60 Minutes Intravenous Every 48 hours 11/24/17 1956 11/24/17 2000   11/23/17 2200  cefTRIAXone (ROCEPHIN) 1 g in  dextrose 5 % 50 mL IVPB  Status:  Discontinued     1 g 100 mL/hr over 30 Minutes Intravenous Every 24 hours 11/23/17 0004 11/23/17 1556   11/23/17 2200  azithromycin (ZITHROMAX) 500 mg in dextrose 5 % 250 mL IVPB  Status:  Discontinued     500 mg 250 mL/hr over 60 Minutes Intravenous Every 24 hours 11/23/17 0004 11/24/17 1058   11/23/17 1700  Ampicillin-Sulbactam (UNASYN) 3 g in sodium chloride 0.9 % 100 mL IVPB  Status:  Discontinued     3 g 200 mL/hr over 30 Minutes Intravenous Every 12 hours 11/23/17 1613 11/24/17 1955   11/23/17 1200  dolutegravir (TIVICAY) tablet 50 mg     50 mg Oral Daily 11/23/17 0004     11/23/17 1200  emtricitabine-tenofovir AF (DESCOVY) 200-25 MG per tablet 1 tablet  Status:  Discontinued     1 tablet Oral Daily 11/23/17 0004 11/26/17 0951   11/23/17 1045  linezolid (ZYVOX) IVPB 600 mg  Status:  Discontinued    Comments:  Please adjust dose according to renal function   600 mg 300 mL/hr over 60 Minutes Intravenous Every 12 hours 11/23/17 1042 11/27/17 1436   11/23/17 1000  bictegravir-emtricitabine-tenofovir AF ([name]) 50-200-25 MG per tablet 1 tablet  Status:  Discontinued     1 tablet Oral Daily 11/23/17 0004 11/23/17 0439   11/23/17 0100  sulfamethoxazole-trimethoprim (BACTRIM DS,SEPTRA DS) 800-160 MG per tablet 1 tablet  Status:  Discontinued     1 tablet Oral Every 12 hours 11/23/17 0004 11/23/17 1041   11/23/17 0045  vancomycin (VANCOCIN) 1,500 mg in sodium chloride 0.9 % 500 mL IVPB     1,500 mg 250 mL/hr over 120 Minutes Intravenous  Once 11/23/17 0037 11/23/17 0328   11/22/17 2245  cefTRIAXone (ROCEPHIN) 1 g in dextrose 5 % 50 mL IVPB     1 g 100 mL/hr over 30 Minutes Intravenous  Once 11/22/17 2231 11/23/17 0041   11/22/17 2245  azithromycin (ZITHROMAX) 500 mg in dextrose 5 % 250 mL IVPB     500 mg 250 mL/hr over 60 Minutes Intravenous  Once 11/22/17 2231 11/23/17 0142      Subjective:   David [name] seen and examined today.  Patient has no  complaints today. Denies chest pain, shortness of breath, abdominal pain, nausea, vomiting. Ready for breakfast.  Objective:   Vitals:   11/30/17 0400 11/30/17 0737 11/30/17 0924 11/30/17 0930  BP: 134/83  139/79 139/79  Pulse: 68   78  Resp: 20     Temp:  (!) 97.4 F (36.3 C)    TempSrc:  Oral    SpO2: 99%     Weight:      Height:        Intake/Output Summary (Last 24 hours) at 11/30/2017 1022 Last data filed at 11/30/2017 0947 Gross per 24 hour  Intake 573 ml  Output 1800 ml  Net -1227 ml   Filed Weights   11/27/17 0500 11/29/17 0316 11/30/17 0349  Weight: 97.3 kg (214 lb 8.1 oz) 92.6 kg (204 lb 2.3 oz) 92.6 kg (204 lb 2.3 oz)    Exam  General: Well developed, well nourished, NAD, appears stated age  HEENT: NCAT, mucous membranes moist.   Cardiovascular: S1 S2 auscultated, RRR,  no murmur  Respiratory: Clear, no wheezing  Abdomen: Soft, nontender, nondistended, + bowel sounds  Extremities: warm dry without cyanosis clubbing or edema  Neuro: AAOx3, nonfocal  Psych: Appropriate   Data Reviewed: I have personally reviewed following labs and imaging studies  CBC: Recent Labs  Lab 11/25/17 0611 11/26/17 0610 11/27/17 0500 11/28/17 1014 11/30/17 0509  WBC 11.2* 12.2* 13.7* 11.7* 6.7  HGB 11.0* 10.1* 11.0* 11.2* 11.5*  HCT 34.3* 31.1* 33.7* 34.8* 35.9*  MCV 101.8* 101.0* 101.5* 99.7 102.0*  PLT 178 176 214 227 846   Basic Metabolic Panel: Recent Labs  Lab 11/25/17 0611 11/26/17 0610 11/27/17 0500 11/28/17 1014 11/30/17 0509  NA 139 140 140 142 140  K 4.7 4.4 4.4 4.6 4.4  CL 109 111 110 106 105  CO2 21* 21* 21* 23 26  GLUCOSE 171* 128* 133* 147* 111*  BUN 72* 75* 70* 75* 81*  CREATININE 4.76* 4.58* 4.59* 4.79* 4.95*  CALCIUM 8.3* 8.4* 8.6* 9.0 8.8*  MG 2.0 2.1 2.2 2.3  --   PHOS 5.3* 4.8* 4.6 6.1* 5.7*   GFR: Estimated Creatinine Clearance: 14.2 mL/min (A) (by C-G formula based on SCr of 4.95 mg/dL (H)). Liver Function Tests: Recent Labs    Lab 11/30/17 0509  ALBUMIN 3.0*   No results for input(s): LIPASE, AMYLASE in the last 168 hours. No results for input(s): AMMONIA in the last 168 hours. Coagulation Profile: No results for input(s): INR, PROTIME in the last 168 hours. Cardiac Enzymes: Recent Labs  Lab 11/25/17 0611 11/25/17 0816 11/25/17 1950 11/27/17 0144 11/27/17 0500  TROPONINI 0.07* 0.07* 0.05* 0.08* 0.09*   BNP (last 3 results) No results for input(s): PROBNP in the last 8760 hours. HbA1C: No results for input(s): HGBA1C in the last 72 hours. CBG: Recent Labs  Lab 11/29/17 1545 11/29/17 2017 11/29/17 2315 11/30/17 0344 11/30/17 0736  GLUCAP 133* 153* 96 115* 123*   Lipid Profile: No results for input(s): CHOL, HDL, LDLCALC, TRIG, CHOLHDL, LDLDIRECT in the last 72 hours. Thyroid Function Tests: No results for input(s): TSH, T4TOTAL, FREET4, T3FREE, THYROIDAB in the last 72 hours. Anemia Panel: No results for input(s): VITAMINB12, FOLATE, FERRITIN, TIBC, IRON, RETICCTPCT in the last 72 hours. Urine analysis:    Component Value Date/Time   COLORURINE YELLOW 11/25/2017 0009   APPEARANCEUR HAZY (A) 11/25/2017 0009   LABSPEC 1.014 11/25/2017 0009   PHURINE 5.0 11/25/2017 0009   GLUCOSEU NEGATIVE 11/25/2017 0009   HGBUR NEGATIVE 11/25/2017 0009   BILIRUBINUR NEGATIVE 11/25/2017 0009   BILIRUBINUR negative 04/16/2013 1447   KETONESUR NEGATIVE 11/25/2017 0009   PROTEINUR NEGATIVE 11/25/2017 0009   UROBILINOGEN 0.2 05/11/2014 1352   NITRITE NEGATIVE 11/25/2017 0009   LEUKOCYTESUR NEGATIVE 11/25/2017 0009   Sepsis Labs: _0 (procalcitonin:4,lacticidven:4)  ) Recent Results (from the past 240 hour(s))  Blood culture (routine x 2)     Status: None   Collection Time: 11/22/17 11:40 PM  Result Value Ref Range Status   Specimen Description BLOOD RIGHT HAND  Final   Special Requests   Final    BOTTLES DRAWN AEROBIC AND ANAEROBIC Blood Culture adequate volume   Culture NO GROWTH 5 DAYS   Final   Report Status 11/28/2017 FINAL  Final  MRSA PCR Screening     Status: None   Collection Time: 11/23/17  6:41 AM  Result Value Ref Range Status   MRSA by PCR NEGATIVE NEGATIVE Final    Comment:        The GeneXpert MRSA Assay (FDA  approved for NASAL specimens only), is one component of a comprehensive MRSA colonization surveillance program. It is not intended to diagnose MRSA infection nor to guide or monitor treatment for MRSA infections.   Culture, blood (single)     Status: None   Collection Time: 11/24/17  8:45 PM  Result Value Ref Range Status   Specimen Description BLOOD CENTRAL LINE  Final   Special Requests   Final    IN PEDIATRIC BOTTLE Blood Culture results may not be optimal due to an excessive volume of blood received in culture bottles   Culture NO GROWTH 5 DAYS  Final   Report Status 11/29/2017 FINAL  Final  Culture, respiratory (NON-Expectorated)     Status: None   Collection Time: 11/24/17  9:00 PM  Result Value Ref Range Status   Specimen Description TRACHEAL ASPIRATE  Final   Special Requests NONE  Final   Gram Stain   Final    MODERATE WBC PRESENT,BOTH PMN AND MONONUCLEAR NO SQUAMOUS EPITHELIAL CELLS SEEN RARE BUDDING YEAST SEEN    Culture RARE Consistent with normal respiratory flora.  Final   Report Status 11/27/2017 FINAL  Final  Respiratory Panel by PCR     Status: None   Collection Time: 11/25/17  2:09 AM  Result Value Ref Range Status   Adenovirus NOT DETECTED NOT DETECTED Final   Coronavirus 229E NOT DETECTED NOT DETECTED Final   Coronavirus HKU1 NOT DETECTED NOT DETECTED Final   Coronavirus NL63 NOT DETECTED NOT DETECTED Final   Coronavirus OC43 NOT DETECTED NOT DETECTED Final   Metapneumovirus NOT DETECTED NOT DETECTED Final   Rhinovirus / Enterovirus NOT DETECTED NOT DETECTED Final   Influenza A NOT DETECTED NOT DETECTED Final   Influenza B NOT DETECTED NOT DETECTED Final   Parainfluenza Virus 1 NOT DETECTED NOT DETECTED Final    Parainfluenza Virus 2 NOT DETECTED NOT DETECTED Final   Parainfluenza Virus 3 NOT DETECTED NOT DETECTED Final   Parainfluenza Virus 4 NOT DETECTED NOT DETECTED Final   Respiratory Syncytial Virus NOT DETECTED NOT DETECTED Final   Bordetella pertussis NOT DETECTED NOT DETECTED Final   Chlamydophila pneumoniae NOT DETECTED NOT DETECTED Final   Mycoplasma pneumoniae NOT DETECTED NOT DETECTED Final  Culture, bal-quantitative     Status: None   Collection Time: 11/25/17 11:33 AM  Result Value Ref Range Status   Specimen Description BRONCHIAL ALVEOLAR LAVAGE  Final   Special Requests Immunocompromised  Final   Gram Stain   Final    ABUNDANT WBC PRESENT,BOTH PMN AND MONONUCLEAR NO SQUAMOUS EPITHELIAL CELLS SEEN NO ORGANISMS SEEN    Culture NO GROWTH 2 DAYS  Final   Report Status 11/27/2017 FINAL  Final  Acid Fast Smear (AFB)     Status: None   Collection Time: 11/25/17 11:34 AM  Result Value Ref Range Status   AFB Specimen Processing Concentration  Final   Acid Fast Smear Negative  Final    Comment: (NOTE) Performed At: [facility] [address] [place], [place] [id number] Rush Farmer MD [id number]    Source (AFB) BRONCHIAL ALVEOLAR LAVAGE  Final  Fungus Culture With Stain     Status: None (Preliminary result)   Collection Time: 11/25/17 11:34 AM  Result Value Ref Range Status   Fungus Stain Final report  Final    Comment: (NOTE) Performed At: [facility] [place], [place] [id number] Rush Farmer MD [id number]    Fungus (Mycology) Culture PENDING  Incomplete   Fungal Source BRONCHIAL ALVEOLAR  LAVAGE  Final  Fungus Culture Result     Status: None   Collection Time: 11/25/17 11:34 AM  Result Value Ref Range Status   Result 1 Comment  Final    Comment: (NOTE) KOH/Calcofluor preparation:  no fungus observed. Performed At: [facility] [place], [place] [id number] Rush Farmer MD [id number]   Pneumocystis  smear by DFA     Status: None   Collection Time: 11/25/17 11:36 AM  Result Value Ref Range Status   Specimen Source-[facility] BRONCHIAL ALVEOLAR LAVAGE  Final   Pneumocystis jiroveci Ag NEGATIVE  Final      Radiology Studies: No results found.   Scheduled Meds: . amLODipine  10 mg Oral Daily  . aspirin  81 mg Per Tube Daily  . brimonidine  1 drop Left Eye BID  . carvedilol  25 mg Per Tube BID WC  . chlorhexidine gluconate (MEDLINE KIT)  15 mL Mouth Rinse BID  . Chlorhexidine Gluconate Cloth  6 each Topical Q0600  . dolutegravir  50 mg Oral Daily  . dorzolamide-timolol  1 drop Left Eye BID  . emtricitabine-tenofovir AF  1 tablet Per Tube Daily  . furosemide  40 mg Intravenous Q12H  . heparin  5,000 Units Subcutaneous Q8H  . insulin aspart  0-15 Units Subcutaneous Q4H  . latanoprost  1 drop Left Eye QHS  . mouth rinse  15 mL Mouth Rinse QID  . pantoprazole  40 mg Oral Daily  . sodium chloride flush  3 mL Intravenous Q12H   Continuous Infusions: . sodium chloride 250 mL (11/26/17 1900)  . sodium chloride       LOS: 7 days   Time Spent in minutes   30 minutes  Shantea Poulton D.O. on 11/30/2017 at 10:22 AM  Between 7am to 7pm - Pager - [phone]  After 7pm go to [email or web address] - password [email or web address]  And look for the night coverage person covering for me after hours  [facility] Office  [phone]

## 2017-12-01 LAB — RENAL FUNCTION PANEL
ALBUMIN: 3.3 g/dL — AB (ref 3.5–5.0)
ANION GAP: 12 (ref 5–15)
BUN: 78 mg/dL — ABNORMAL HIGH (ref 6–20)
CALCIUM: 9.1 mg/dL (ref 8.9–10.3)
CO2: 23 mmol/L (ref 22–32)
CREATININE: 4.96 mg/dL — AB (ref 0.61–1.24)
Chloride: 104 mmol/L (ref 101–111)
GFR calc non Af Amer: 10 mL/min — ABNORMAL LOW (ref >60)
GFR, EST AFRICAN AMERICAN: 12 mL/min — AB (ref >60)
Glucose, Bld: 117 mg/dL — ABNORMAL HIGH (ref 65–99)
PHOSPHORUS: 5 mg/dL — AB (ref 2.5–4.6)
Potassium: 4.4 mmol/L (ref 3.5–5.1)
SODIUM: 139 mmol/L (ref 135–145)

## 2017-12-01 LAB — CBC
HCT: 39.3 % (ref 39.0–52.0)
Hemoglobin: 13 g/dL (ref 13.0–17.0)
MCH: 33.2 pg (ref 26.0–34.0)
MCHC: 33.1 g/dL (ref 30.0–36.0)
MCV: 100.5 fL — ABNORMAL HIGH (ref 78.0–100.0)
PLATELETS: 218 10*3/uL (ref 150–400)
RBC: 3.91 MIL/uL — AB (ref 4.22–5.81)
RDW: 13.8 % (ref 11.5–15.5)
WBC: 9.5 10*3/uL (ref 4.0–10.5)

## 2017-12-01 LAB — GLUCOSE, CAPILLARY
GLUCOSE-CAPILLARY: 134 mg/dL — AB (ref 65–99)
GLUCOSE-CAPILLARY: 153 mg/dL — AB (ref 65–99)
GLUCOSE-CAPILLARY: 216 mg/dL — AB (ref 65–99)

## 2017-12-01 MED ORDER — ASPIRIN 81 MG PO CHEW: 81.0000 mg | CHEWABLE_TABLET | Freq: Every day | ORAL | Status: DC

## 2017-12-01 MED ORDER — FUROSEMIDE 80 MG PO TABS
80.0000 mg | ORAL_TABLET | Freq: Every day | ORAL | Status: DC
  Administered 2017-12-01: 80 mg via ORAL
  Filled 2017-12-01: qty 1

## 2017-12-01 NOTE — Discharge Instructions (Signed)
Acute Respiratory Distress Syndrome, Adult Acute respiratory distress syndrome is a life-threatening condition in which fluid collects in the lungs. This prevents the lungs from filling with air and passing oxygen into the blood. This can cause the lungs and other vital organs to fail. The condition usually develops following an infection, illness, surgery, or injury. What are the causes? This condition may be caused by:  An infection, such as sepsis or pneumonia.  A serious injury to the head or chest.  Severe bleeding from an injury.  A major surgery.  Breathing in harmful chemicals or smoke.  Blood transfusions.  A blood clot in the lungs.  Breathing in vomit (aspiration).  Near-drowning.  Inflammation of the pancreas (pancreatitis).  A drug overdose.  What are the signs or symptoms? Sudden shortness of breath and rapid breathing are the main symptoms of this condition. Other symptoms may include:  A fast or irregular heartbeat.  Skin, lips, or fingernails that look blue (cyanosis).  Confusion.  Tiredness or loss of energy.  Chest pain, particularly while taking a breath.  Coughing.  Restlessness or anxiety.  Fever. This is usually present if there is an underlying infection, such as pneumonia.  How is this diagnosed? This condition is diagnosed based on:  Your symptoms.  Medical history.  A physical exam. During the exam, your health care provider will listen to your heart and check for crackling or wheezing sounds in your lungs.  You may also have other tests to confirm the diagnosis and measure how well your lungs are working. These may include:  Measuring the amount of oxygen in your blood. Your health care provider will use two methods to do this procedure: ? A small device (pulse oximeter) that is placed on your finger, earlobe, or toe. ? An arterial blood gas test. A sample of blood is taken from an artery and tested for oxygen levels.  Blood  tests.  Chest X-rays or CT scans to look for fluid in the lungs.  Taking a sample of your sputum to test for infection.  Heart test, such as an echocardiogram or electrocardiogram. This is done to rule out any heart problems (such as heart failure) that may be causing your symptoms.  Bronchoscopy. During this test, a thin, flexible tube with a light is passed into the mouth or nose, down the windpipe, and into the lungs.  How is this treated? Treatment depends on the cause of your condition. The goal is to support you while your lungs heal and the underlying cause is treated. Treatment may include:  Oxygen therapy. This may be done through: ? A tube in your nose or a face mask. ? A ventilator. This device helps move air into and out of your lungs through a breathing tube that is inserted into your mouth or nose.  Continuous positive airway pressure (CPAP). This treatment uses mild air pressure to keep the airways open. A mask or other device will be placed over your nose or mouth.  Tracheostomy. During this procedure, a small cut is made in your neck to create an opening to your windpipe. A breathing tube is placed directly into your windpipe. The breathing tube is connected to a ventilator. This is done if you have problems with your airway or if you need a ventilator for a long period of time.  Positioning you to lie on your stomach (prone position).  Medicines, such as: ? Sedatives to help you relax. ? Blood pressure medicines. ? Antibiotics to treat  infection. ? Blood thinners to prevent blood clots. ? Diuretics to help prevent excess fluid.  Fluids and nutrients given through an IV tube.  Wearing compression stockings on your legs to prevent blood clots.  Extra corporeal membrane oxygenation (ECMO). This treatment takes blood outside your body, adds oxygen, and removes carbon dioxide. The blood is then returned to your body. This treatment is only used in severe  cases.  Follow these instructions at home:  Take over-the-counter and prescription medicines only as told by your health care provider.  Do not use any products that contain nicotine or tobacco, such as cigarettes and e-cigarettes. If you need help quitting, ask your health care provider.  Limit alcohol intake to no more than 1 drink per day for nonpregnant women and 2 drinks per day for men. One drink equals 12 oz of beer, 5 oz of wine, or 1 oz of hard liquor.  Ask friends and family to help you if daily activities make you tired.  Attend any pulmonary rehabilitation as told by your health care provider. This may include: ? Education about your condition. ? Exercises. ? Breathing training. ? Counseling. ? Learning techniques to conserve energy. ? Nutrition counseling.  Keep all follow-up visits as told by your health care provider. This is important. Contact a health care provider if:  You become short of breath during activity or while resting.  You develop a cough that does not go away.  You have a fever.  Your symptoms do not get better or they get worse.  You become anxious or depressed. Get help right away if:  You have sudden shortness of breath.  You develop sudden chest pain that does not go away.  You develop a rapid heart rate.  You develop swelling or pain in one of your legs.  You cough up blood.  You have trouble breathing.  Your skin, lips, or fingernails turn blue. These symptoms may represent a serious problem that is an emergency. Do not wait to see if the symptoms will go away. Get medical help right away. Call your local emergency services (911 in the [place] Do not drive yourself to the hospital. Summary  Acute respiratory distress syndrome is a life-threatening condition in which fluid collects in the lungs, which leads the lungs and other vital organs to fail.  This condition usually develops following an infection, illness, surgery, or  injury.  Sudden shortness of breath and rapid breathing are the main symptoms of acute respiratory distress syndrome.  Treatment may include oxygen therapy, continuous positive airway pressure (CPAP), tracheostomy, lying on your stomach (prone position), medicines, fluids and nutrients given through an IV tube, compression stockings, and extra corporeal membrane oxygenation (ECMO). This information is not intended to replace advice given to you by your health care provider. Make sure you discuss any questions you have with your health care provider. Document Released: 11/20/2005 Document Revised: 11/06/2016 Document Reviewed: 11/06/2016 Elsevier Interactive Patient Education  2017 [place] Pneumonia, Adult Pneumonia is an infection of the lungs. One type of pneumonia can happen while a person is in a hospital. A different type can happen when a person is not in a hospital (community-acquired pneumonia). It is easy for this kind to spread from person to person. It can spread to you if you breathe near an infected person who coughs or sneezes. Some symptoms include:  A dry cough.  A wet (productive) cough.  Fever.  Sweating.  Chest pain.  Follow these  instructions at home:  Take over-the-counter and prescription medicines only as told by your doctor. ? Only take cough medicine if you are losing sleep. ? If you were prescribed an antibiotic medicine, take it as told by your doctor. Do not stop taking the antibiotic even if you start to feel better.  Sleep with your head and neck raised (elevated). You can do this by putting a few pillows under your head, or you can sleep in a recliner.  Do not use tobacco products. These include cigarettes, chewing tobacco, and e-cigarettes. If you need help quitting, ask your doctor.  Drink enough water to keep your pee (urine) clear or pale yellow. A shot (vaccine) can help prevent pneumonia. Shots are often suggested  for:  People older than 75 years of age.  People older than 75 years of age: ? Who are having cancer treatment. ? Who have long-term (chronic) lung disease. ? Who have problems with their body's defense system (immune system).  You may also prevent pneumonia if you take these actions:  Get the flu (influenza) shot every year.  Go to the dentist as often as told.  Wash your hands often. If soap and water are not available, use hand sanitizer.  Contact a doctor if:  You have a fever.  You lose sleep because your cough medicine does not help. Get help right away if:  You are short of breath and it gets worse.  You have more chest pain.  Your sickness gets worse. This is very serious if: ? You are an older adult. ? Your body's defense system is weak.  You cough up blood. This information is not intended to replace advice given to you by your health care provider. Make sure you discuss any questions you have with your health care provider. Document Released: 05/08/2008 Document Revised: 04/27/2016 Document Reviewed: 03/17/2015 Elsevier Interactive Patient Education  2018 [place].  Heart Failure Heart failure means your heart has trouble pumping blood. This makes it hard for your body to work well. Heart failure is usually a long-term (chronic) condition. You must take good care of yourself and follow your doctor's treatment plan. Follow these instructions at home:  Take your heart medicine as told by your doctor. ? Do not stop taking medicine unless your doctor tells you to. ? Do not skip any dose of medicine. ? Refill your medicines before they run out. ? Take other medicines only as told by your doctor or pharmacist.  Stay active if told by your doctor. The elderly and people with severe heart failure should talk with a doctor about physical activity.  Eat heart-healthy foods. Choose foods that are without trans fat and are low in saturated fat, cholesterol, and  salt (sodium). This includes fresh or frozen fruits and vegetables, fish, lean meats, fat-free or low-fat dairy foods, whole grains, and high-fiber foods. Lentils and dried peas and beans (legumes) are also good choices.  Limit salt if told by your doctor.  Cook in a healthy way. Roast, grill, broil, bake, poach, steam, or stir-fry foods.  Limit fluids as told by your doctor.  Weigh yourself every morning. Do this after you pee (urinate) and before you eat breakfast. Write down your weight to give to your doctor.  Take your blood pressure and write it down if your doctor tells you to.  Ask your doctor how to check your pulse. Check your pulse as told.  Lose weight if told by your doctor.  Stop smoking or  chewing tobacco. Do not use gum or patches that help you quit without your doctor's approval.  Schedule and go to doctor visits as told.  Nonpregnant women should have no more than 1 drink a day. Men should have no more than 2 drinks a day. Talk to your doctor about drinking alcohol.  Stop illegal drug use.  Stay current with shots (immunizations).  Manage your health conditions as told by your doctor.  Learn to manage your stress.  Rest when you are tired.  If it is really hot outside: ? Avoid intense activities. ? Use air conditioning or fans, or get in a cooler place. ? Avoid caffeine and alcohol. ? Wear loose-fitting, lightweight, and light-colored clothing.  If it is really cold outside: ? Avoid intense activities. ? Layer your clothing. ? Wear mittens or gloves, a hat, and a scarf when going outside. ? Avoid alcohol.  Learn about heart failure and get support as needed.  Get help to maintain or improve your quality of life and your ability to care for yourself as needed. Contact a doctor if:  You gain weight quickly.  You are more short of breath than usual.  You cannot do your normal activities.  You tire easily.  You cough more than normal, especially  with activity.  You have any or more puffiness (swelling) in areas such as your hands, feet, ankles, or belly (abdomen).  You cannot sleep because it is hard to breathe.  You feel like your heart is beating fast (palpitations).  You get dizzy or light-headed when you stand up. Get help right away if:  You have trouble breathing.  There is a change in mental status, such as becoming less alert or not being able to focus.  You have chest pain or discomfort.  You faint. This information is not intended to replace advice given to you by your health care provider. Make sure you discuss any questions you have with your health care provider. Document Released: 08/29/2008 Document Revised: 04/27/2016 Document Reviewed: 01/06/2013 Elsevier Interactive Patient Education  2017 [employer].

## 2017-12-01 NOTE — Care Management Note (Signed)
Case Management Note  Patient Details  Name: David [name] MRN: [id number] Date of Birth: June 24, 1941  Subjective/Objective:                 DC to home w [name]. Patient declines DME needs   Action/Plan:   Expected Discharge Date:  12/01/17               Expected Discharge Plan:  [place]  In-House Referral:     Discharge planning Services  CM Consult  Post Acute Care Choice:  Home Health Choice offered to:  Patient  DME Arranged:    DME Agency:     HH Arranged:  RN, PT HH Agency:  [place]  Status of Service:  Completed, signed off  If discussed at [place] of Stay Meetings, dates discussed:    Additional Comments:  Carles Collet, RN 12/01/2017, 11:16 AM

## 2017-12-01 NOTE — Progress Notes (Signed)
[facility] Progress Note    Assessment/ Plan:   1.  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 very mild elevation in Cr from baseline of 4.5.  He is nonoliguric and does not have any indication for dialysis.  Would avoid NSAIDS IV contrast fleets enemas and mag citrate.  He does not have any indications for HD at present.  He has 2 failed accesses and the next option would be a graft.  His elevation in Cr is likely septic mediated AKI +/- effect from Bactrim which should resolve with its discontinuation as has already occurred.  His Cr has stabilized and he is OK to go from a renal perspective with close followup.  He has an appointment with Dr. Posey Pronto 12/19/17 at 2:45 pm.    2.  Acute hypoxic respiratory failure: required intubation and extubated to bipap, now off all O2 S/p empiric antibiotics.  PCP to be unlikely.  Bactrim has been stopped.  3.  HIV: VL well controlled, continuing descovy and T[place]ay.  Of note, descovy should be used with extreme caution with eGFR < 30.  Would defer to pt's ID provider for med changes.    4.  Acute on chronic combined systolic and diastolic CHF: Last OP weight 97.5 kg; now below this weight at 92.6.  On coreg and lasix.  I have converted his IV lasix to 80 mg PO this AM which is his home dose.   5.  Dispo: OK to go from renal perspective.  Has appt with primary nephrologist as above.  I am arranging labs with our office the week of Jan 7th.  Subjective:    Off O2.  Feeling better.  Cr stabilized.   Objective:   BP (!) 151/83   Pulse 77   Temp 98.2 F (36.8 C) (Oral)   Resp (!) 21   Ht 6' (1.829 m)   Wt 88.3 kg (194 lb 10.7 oz)   SpO2 99%   BMI 26.40 kg/m   Intake/Output Summary (Last 24 hours) at 12/01/2017 0921 Last data filed at 12/01/2017 0900 Gross per 24 hour  Intake 893 ml  Output 400 ml  Net 493 ml    Weight change: -4.3 kg (-7.7 oz)  Physical Exam: GEN NAD, lying in bed HEENT EOMI, PERRL NECK no overt JVD PULM clear to ascultation anteriorly, crackles resolved CV RRR soft systolic murmur ABD nontender EXT trace LE edema NEURO AAO x 3 no asterixis    Imaging: No results found.  Labs: BMET Recent Labs  Lab 11/25/17 0009 11/25/17 0611 11/26/17 0610 11/27/17 0500 11/28/17 1014 11/30/17 0509 12/01/17 0259  NA 136 139 140 140 142 140 139  K 4.5 4.7 4.4 4.4 4.6 4.4 4.4  CL 105 109 111 110 106 105 104  CO2 21* 21* 21* 21* 23 26 23   GLUCOSE 177* 171* 128* 133* 147* 111* 117*  BUN 69* 72* 75* 70* 75* 81* 78*  CREATININE 4.64* 4.76* 4.58* 4.59* 4.79* 4.95* 4.96*  CALCIUM 8.1* 8.3* 8.4* 8.6* 9.0 8.8* 9.1  PHOS  --  5.3* 4.8* 4.6 6.1* 5.7* 5.0*   CBC Recent Labs  Lab 11/27/17 0500 11/28/17 1014 11/30/17 0509 12/01/17 0308  WBC 13.7* 11.7* 6.7 9.5  HGB 11.0* 11.2* 11.5* 13.0  HCT 33.7* 34.8* 35.9* 39.3  MCV 101.5* 99.7 102.0* 100.5*  PLT 214 227 201 218    Medications:    .  amLODipine  10 mg Oral Daily  . aspirin  81 mg Per Tube Daily  . brimonidine  1 drop Left Eye BID  . carvedilol  25 mg Per Tube BID WC  . chlorhexidine gluconate (MEDLINE KIT)  15 mL Mouth Rinse BID  . Chlorhexidine Gluconate Cloth  6 each Topical Q0600  . dolutegravir  50 mg Oral Daily  . dorzolamide-timolol  1 drop Left Eye BID  . emtricitabine-tenofovir AF  1 tablet Per Tube Daily  . furosemide  40 mg Intravenous Daily  . heparin  5,000 Units Subcutaneous Q8H  . insulin aspart  0-15 Units Subcutaneous Q4H  . latanoprost  1 drop Left Eye QHS  . mouth rinse  15 mL Mouth Rinse QID  . pantoprazole  40 mg Oral Daily  . sodium chloride flush  3 mL Intravenous Q12H      Madelon Lips MD [facility] pgr [phone] 12/01/2017, 9:21 AM

## 2017-12-01 NOTE — Discharge Summary (Signed)
Physician Discharge Summary  David [name] FUX:[id number] DOB: 30-Sep-1941 DOA: 11/22/2017  PCP: [name], NP  Admit date: 11/22/2017 Discharge date: 12/01/2017  Time spent: 45 minutes  Recommendations for Outpatient Follow-up:  Patient will be discharged to home with home health physical therapy.  Patient will need to follow up with primary care provider within one week of discharge.  Follow up with nephrology, Dr. Posey Pronto, on 12/19/2017 at 2:45pm.  Follow up with the infectious disease clinic on 12/31/2017. Patient should continue medications as prescribed.  Patient should follow a heart healthy diet with 1245m fluid restriction per day.   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 TChristia ReadingOpyd David WHALINGis a 76y.o. male with medical history significant for HIV/AIDS (CD4 310 and VL undetectable in Oct '18), hypertension, chronic combined systolic/diastolic CHF, prostate cancer status post brachytherapy, and chronic kidney disease stage IV, now presenting to the emergency department for evaluation of acute respiratory distress with cough and hypoxia.  Patient reports that he has been developing dyspnea with minimal exertion and productive cough over the past day before worsening acutely this evening.  Began to develop a general malaise friend's house, and on the way home, had acute worsening in his dyspnea and pulled over the side of the road where he apparently vomited.  EMS found him to be saturating in the 70s, we  gave him a continuous albuterol treatment, Atrovent, nitroglycerin, placed him on CPAP, and transported him to the hospital.  He denies any chest pain or palpitations and denies any lower extremity swelling or tenderness.  Hospital Course:  Multifocal pneumonia/Acute hypoxic respiratory failure -Patient required intubation on 12/22 and successfully extubated on 12/24. -Transitioned to BiPAP and weaned to room air -Placed on several antibiotics and completed 5 days of unasyn -blood cultures show no growth to date  -AFB culture pending, smear negative  HIV/AIDS -Last CD4 count of 170.  -infectious disease consulted and appreciated -linezolid and Bactrim discontinued -Continue descovy and tivicay -outpatient follow up 12/31/2017  Acute on chronic combined systolic and diastolic heart failure -Echocardiogram showed EF 202-54% grade 2 diastolic dysfunction  -Follows with Dr. CSallyanne Kuster Plan was for a cardiac CT as an outpatient -Not on an ACEi/ARB secondary to allergy. -Continue lasix (home dose) and coreg -weight down to 88.3kg  ?Pneumocystis  -Per infectious disease, unlikely. AFB smear negative. 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 PPosey Pronto He has had two failed fistulas.  -baseline creatinine 3.6-3.8 (eariler this year) -Currently 4.96 -Nephrology consulted and appreciated- feels patient is currently stable and not needing dialysis currently. Recommended outpatient follow up, appt listed above. Continue home dose of lasix.   Hyperglycemia -Secondary to steroids, which have been discontinued   Essential hypertension -Continue amlodipine and Coreg  Consultants PCCM Infectious disease Nephrology  Procedures  Intubation/extubation 12/22-12/24/2018    Discharge Exam: Vitals:   12/01/17 0900 12/01/17 0909  BP: 138/87 (!) 151/83  Pulse: 81   Resp: (!) 21   Temp:    SpO2: 100%  Patient  denies dizziness, chest pain, shortness of breath, abdominal pain, N/V/D/C   General: Well developed, well nourished, NAD, appears stated age  HEENT: NCAT, mucous membranes moist.  Cardiovascular: S1 S2 auscultated, 1/6SEM, RRR  Respiratory: Clear to auscultation bilaterally with equal chest rise  Abdomen: Soft, nontender, nondistended, + bowel sounds  Extremities: warm dry without cyanosis clubbing. Trace LE edema.  Neuro: AAOx3, nonfocal  Psych: Normal affect and demeanor  Discharge Instructions Discharge Instructions    Discharge instructions   Complete by:  As directed    Patient will be discharged to home with home health physical therapy.  Patient will need to follow up with primary care provider within one week of discharge.  Follow up with nephrology, Dr. Posey Pronto, on 116/2019 at 2:45pm.  Follow up with the infectious disease clinic on 12/31/2017. Patient should continue medications as prescribed.  Patient should follow a heart healthy diet.     Allergies as of 12/01/2017      Reactions   Codeine Other (See Comments)   Causes constipation   Cozaar [losartan Potassium] Other (See Comments)   Causes constipation      Medication List    STOP taking these medications   aspirin 325 MG EC tablet Replaced by:  aspirin 81 MG chewable tablet     TAKE these medications   acetaminophen 500 MG tablet Commonly known as:  TYLENOL Take 500 mg by mouth every 4 (four) hours as needed (for muscle pain).   amLODipine 10 MG tablet Commonly known as:  NORVASC TAKE 1 TABLET EVERY DAY What changed:    how much to take  how to take this  when to take this   aspirin 81 MG chewable tablet Place 1 tablet (81 mg total) into feeding tube daily. Start taking on:  12/02/2017 Replaces:  aspirin 325 MG EC tablet   brimonidine 0.15 % ophthalmic solution Commonly known as:  ALPHAGAN Place 1 drop into the left eye 2 (two) times daily.   carvedilol 25 MG tablet Commonly known as:   COREG TAKE 1 TABLET TWICE DAILY What changed:    how much to take  how to take this  when to take this   dolutegravir 50 MG tablet Commonly known as:  TIVICAY Take 1 tablet (50 mg total) by mouth daily.   dorzolamide-timolol 22.3-6.8 MG/ML ophthalmic solution Commonly known as:  COSOPT Place 1 drop into the left eye 2 (two) times daily.   emtricitabine-tenofovir AF 200-25 MG tablet Commonly known as:  DESCOVY Take 1 tablet by mouth daily.   furosemide 40 MG tablet Commonly known as:  LASIX Take 2 tablets (80 mg total) by mouth daily. What changed:  when to take this   latanoprost 0.005 % ophthalmic solution Commonly known as:  XALATAN Place 1 drop into the left eye at bedtime.   ranitidine 150 MG tablet Commonly known as:  ZANTAC Take 1 tablet (150 mg total) by mouth 2 (two) times daily.   Vitamin D (Ergocalciferol) 50000 units Caps capsule Commonly known as:  DRISDOL Take 50,000 units by mouth once a week on Saturdays      Allergies  Allergen Reactions  . Codeine Other (See Comments)    Causes constipation  . Cozaar [Losartan Potassium] Other (See Comments)    Causes constipation    Follow-up Information    Barrett, Evelene Croon, PA-C Follow up on 12/06/2017.   Specialties:  Cardiology, Radiology Why:  Please arrive at 1115 for an 1130 appointment Contact information: [address] [place] [address] [phone]        Elmarie Shiley, MD Follow up on 12/19/2017.   Specialty:  Nephrology Why:  at 2:45pm Contact information: [place] [address] [phone]        [name], NP. Schedule an appointment as soon as possible for a visit in 1 week(s).   Specialty:  Internal Medicine Why:  Hospital follow up Contact information: [address] [place] [address] [phone]        Tommy Medal, Lavell Islam, MD .   Specialty:  Infectious Diseases Contact information: [address] [place] [address] [phone]        [name], MD .   Specialty:  Cardiology Contact information: [address] [place] [address] [phone]        [facility] for Infectious Disease. Go on 12/31/2017.   Specialty:  Infectious Diseases Why:  Call for appointment time Contact information: [place], [place] [id number] [place] [phone]           The results of significant diagnostics from this hospitalization (including imaging, microbiology, ancillary and laboratory) are listed below for reference.    Significant Diagnostic Studies: Dg Chest 1 View  Result Date: 11/25/2017 CLINICAL DATA:  Post bronchoscopy.  Follow-up pneumonia. EXAM: Portable CHEST 1 VIEW COMPARISON:  CT chest earlier same day and 04/29/2014. Chest x-rays yesterday, 11/23/2017 and earlier. FINDINGS: Endotracheal tube tip in satisfactory position approximately 4 cm above the carina. Right jugular central venous catheter tip projects over the upper SVC, unchanged. Nasogastric tube courses below the diaphragm into the stomach. Cardiac silhouette markedly enlarged, unchanged. No evidence of pneumothorax or pneumomediastinum. Airspace consolidation throughout both lungs, most confluent in the left lower lobe. Slight improved aeration in the right lung with worsening aeration in the left lower lobe since yesterday. Mild pulmonary venous hypertension without overt edema. IMPRESSION: 1.  Support apparatus satisfactory. 2. No evidence of pneumothorax or pneumomediastinum post bronchoscopy. 3. Multilobar bilateral pneumonia. Slight improved aeration in the right lung since yesterday. Worsening consolidation in the left lower lobe. Electronically Signed   By: Evangeline Dakin M.D.   On: 11/25/2017 11:54   Dg Chest 1 View  Result Date: 11/24/2017 CLINICAL DATA:  Endotracheal tube placement. EXAM: CHEST 1 VIEW COMPARISON:   11/24/2017 FINDINGS: Interval placement of endotracheal tube with tip 6.3 cm above the carina. Interval placement of enteric tube which courses off the inferior portion of the film as non radiopaque port lies approximately 5 cm above the diaphragm. Lungs are adequately inflated demonstrate slight interval worsening of airspace opacification over the right lung and minimally in the left perihilar region. No definite effusion. Mild stable cardiomegaly. Remainder of the exam is unchanged. IMPRESSION: Possible slight interval worsening of multifocal airspace process most involving the right lung likely multifocal pneumonia. Stable cardiomegaly. Tubes and lines as described. Electronically Signed   By: Marin Olp M.D.   On: 11/24/2017 19:38   Dg Chest 2 View  Result Date: 11/23/2017 CLINICAL DATA:  Shortness of breath cough and hypoxia EXAM: CHEST  2 VIEW COMPARISON:  11/22/2017, 01/25/2010 FINDINGS: Improved aeration bilaterally. No pleural effusion. Mild residual pulmonary infiltrates in the right upper lobe and bilateral lung bases. Stable cardiomegaly with mild central vascular congestion. No pneumothorax. Degenerative changes of the spine. IMPRESSION: 1. Decreased right upper and bilateral lower lobe pulmonary infiltrates or edema. 2. Cardiomegaly with  vascular congestion. Electronically Signed   By: Donavan Foil M.D.   On: 11/23/2017 22:40   Dg Abd 1 View  Result Date: 11/26/2017 CLINICAL DATA:  Orogastric tube placement EXAM: ABDOMEN - 1 VIEW COMPARISON:  CT abdomen and pelvis Apr 29, 2014 FINDINGS: Orogastric tube tip is in the stomach with the side port at the gastroesophageal junction. Visualized bowel gas pattern is unremarkable. No bowel obstruction or free air evident. Visualized lung bases are clear. Heart appears enlarged peer IMPRESSION: No gastric tube tip is in the stomach with side port at gastroesophageal junction. Advise advancing tube approximately 5 to 6 cm to insure that both the  tube and side port are well within the stomach. No evident bowel obstruction or free air.  Heart appears prominent. Electronically Signed   By: Lowella Grip III M.D.   On: 11/26/2017 11:23   Ct Chest Wo Contrast  Result Date: 11/25/2017 CLINICAL DATA:  Acute respiratory illness EXAM: CT CHEST WITHOUT CONTRAST TECHNIQUE: Multidetector CT imaging of the chest was performed following the standard protocol without IV contrast. COMPARISON:  Chest x-rays over the past 3 days.  Chest CT 04/29/2014 FINDINGS: Cardiovascular: Cardiomegaly without pericardial effusion. Diffuse coronary atherosclerotic calcification. No acute vascular finding. The main pulmonary artery is enlarged at 5 cm, suggesting pulmonary hypertension. Patient had echocardiography yesterday. Mediastinum/Nodes: Endotracheal tube tip between the clavicular heads and carina. Right IJ line with tip near the SVC origin. An orogastric tube tip is at the GE junction. Lungs/Pleura: Endotracheal tube tip between the clavicular heads and carina. The central airways are clear. There is bilateral dense consolidation of the lower lobes in the right upper lobe. On admission chest x-ray there was dense asymmetric right-sided consolidation. Alveolar edema could be superimposed, although there is no Kerley lines and no significant pleural effusion. Negative for cavitation. Small area of scarring and mild bronchiectasis in the right upper lobe. No pneumothorax. Upper Abdomen: Partly seen 1 cm left adrenal adenoma. Musculoskeletal: No acute finding. Spondylosis. Sternoclavicular arthropathy with chondrocalcinosis IMPRESSION: 1. History of pneumonia with extensive bilateral consolidation affecting the majority of the lower lobes. No cavitation or significant effusion. 2. Cardiomegaly. Alveolar edema could contribute to the airspace disease but there are no septal lines. 3. Orogastric tube tip at the GE junction. Electronically Signed   By: Monte Fantasia M.D.   On:  11/25/2017 06:36   Dg Chest Port 1 View  Result Date: 11/28/2017 CLINICAL DATA:  76 year old male status post bilateral pneumonia. Shortness of breath. EXAM: PORTABLE CHEST 1 VIEW COMPARISON:  11/27/2017 and earlier. FINDINGS: Portable AP semi upright view at 0651 hours stable right IJ central line. Regressed course, interstitial, and indistinct pulmonary opacity since yesterday. Left lung ventilation now approaching normal. Mild residual indistinct right lung opacity. Stable cardiomegaly and mediastinal contours. Visualized tracheal air column is within normal limits. No pneumothorax or pleural effusion. Stable visualized osseous structures. IMPRESSION: 1. Stable right IJ central line. 2. Regressed bilateral pneumonia with improving bilateral ventilation, near normal on the left. 3. No new cardiopulmonary abnormality. Electronically Signed   By: Genevie Ann M.D.   On: 11/28/2017 07:53   Dg Chest Port 1 View  Result Date: 11/27/2017 CLINICAL DATA:  76 year old male with respiratory distress. EXAM: PORTABLE CHEST 1 VIEW COMPARISON:  Chest radiograph dated 11/26/2017 FINDINGS: There has been interval removal of the endotracheal and enteric tubes. Right IJ central line remains in similar position. There is stable cardiomegaly. There is diffuse airspace density in the right lung  with significant interval improvement compared to the CT of 11/25/2017. Slight residual density also present at the left lung base. No large pleural effusion. No pneumothorax. No acute osseous pathology. IMPRESSION: 1. Interval removal of the endotracheal and enteric tubes. 2. Persistent airspace density predominantly involving the right lung with overall interval improvement compared to the CT of 11/25/2017. Clinical correlation and follow-up recommended. Electronically Signed   By: Anner Crete M.D.   On: 11/27/2017 02:05   Dg Chest Port 1 View  Result Date: 11/26/2017 CLINICAL DATA:  Hypoxia EXAM: PORTABLE CHEST 1 VIEW  COMPARISON:  November 25, 2017 FINDINGS: Endotracheal tube tip is 4.3 cm above the carina. Central catheter tip is in superior vena cava. Nasogastric tube tip and side port below the diaphragm. No pneumothorax evident. Layering pleural effusions are noted. There is atelectatic change in the bases. There is no frank edema or consolidation. There is cardiomegaly with pulmonary vascularity within normal limits. No adenopathy. No bone lesions. IMPRESSION: Tube and catheter positions as described without evident pneumothorax. Cardiomegaly, stable. Clearing pleural effusions. No edema or consolidation. Electronically Signed   By: Lowella Grip III M.D.   On: 11/26/2017 07:04   Dg Chest Port 1 View  Result Date: 11/24/2017 CLINICAL DATA:  76 year old male with central line placement. EXAM: PORTABLE CHEST 1 VIEW COMPARISON:  Radiograph dated 11/24/2017 FINDINGS: There has been interval placement of a right IJ central line the tip over central SVC. No pneumothorax. Endotracheal tube remains above the carina. Enteric tube extends into the left hemiabdomen with tip likely in the proximal stomach just distal to the gastroesophageal junction. The side port of the tube appears to be a above the expected level of the GE junction. Recommend further advancing of the tube into the stomach. Diffuse bilateral airspace opacities, right greater left similar to prior radiograph. Probable small bilateral pleural effusions. No pneumothorax. There is cardiomegaly. No acute osseous pathology. IMPRESSION: 1. Right IJ central line with tip over central SVC. No pneumothorax. 2. Enteric tube with tip just beyond the GE junction. Recommend further advancing the tube into the stomach. Endotracheal tube remains above the carina. 3. No significant interval change in the appearance of the airspace opacity compared to the earlier radiograph. 4. Cardiomegaly. Electronically Signed   By: Anner Crete M.D.   On: 11/24/2017 23:43   Dg Chest  Port 1 View  Result Date: 11/24/2017 CLINICAL DATA:  76 year old male with progressive shortness of breath EXAM: PORTABLE CHEST 1 VIEW COMPARISON:  Recent chest x-ray obtained yesterday 11/23/2017 FINDINGS: Stable marked cardiomegaly. Persistent patchy airspace opacity in the right upper and lower lobes. No new pulmonary edema, pleural effusion or pneumothorax. Overall, minimal change in the appearance of the lungs. No acute osseous abnormality. IMPRESSION: No significant interval change in the appearance of the lungs over the last 24 hours. Persistent patchy airspace opacity in the right upper and lower lobes concerning for multifocal pneumonia. Electronically Signed   By: Jacqulynn Cadet M.D.   On: 11/24/2017 17:58   Dg Chest Portable 1 View  Result Date: 11/22/2017 CLINICAL DATA:  Respiratory distress with shortness of breath EXAM: PORTABLE CHEST 1 VIEW COMPARISON:  Chest radiograph January 25, 2010 and chest CT Apr 29, 2014 FINDINGS: There is airspace consolidation throughout the right mid lower lung zones. There is also consolidation in the medial left base. There is cardiomegaly with pulmonary vascularity within normal limits. There is a small right pleural effusion. No evident adenopathy. No bone lesions. IMPRESSION: Extensive consolidation throughout much  of the right mid and lower lung zones felt to represent multifocal pneumonia. Consolidation medial left base also felt to represent pneumonia. Small right pleural effusion. Cardiomegaly with pulmonary vascularity within normal limits. No adenopathy evident. Electronically Signed   By: Lowella Grip III M.D.   On: 11/22/2017 22:37    Microbiology: Recent Results (from the past 240 hour(s))  Blood culture (routine x 2)     Status: None   Collection Time: 11/22/17 11:40 PM  Result Value Ref Range Status   Specimen Description BLOOD RIGHT HAND  Final   Special Requests   Final    BOTTLES DRAWN AEROBIC AND ANAEROBIC Blood Culture  adequate volume   Culture NO GROWTH 5 DAYS  Final   Report Status 11/28/2017 FINAL  Final  MRSA PCR Screening     Status: None   Collection Time: 11/23/17  6:41 AM  Result Value Ref Range Status   MRSA by PCR NEGATIVE NEGATIVE Final    Comment:        The GeneXpert MRSA Assay (FDA approved for NASAL specimens only), is one component of a comprehensive MRSA colonization surveillance program. It is not intended to diagnose MRSA infection nor to guide or monitor treatment for MRSA infections.   Culture, blood (single)     Status: None   Collection Time: 11/24/17  8:45 PM  Result Value Ref Range Status   Specimen Description BLOOD CENTRAL LINE  Final   Special Requests   Final    IN PEDIATRIC BOTTLE Blood Culture results may not be optimal due to an excessive volume of blood received in culture bottles   Culture NO GROWTH 5 DAYS  Final   Report Status 11/29/2017 FINAL  Final  Culture, respiratory (NON-Expectorated)     Status: None   Collection Time: 11/24/17  9:00 PM  Result Value Ref Range Status   Specimen Description TRACHEAL ASPIRATE  Final   Special Requests NONE  Final   Gram Stain   Final    MODERATE WBC PRESENT,BOTH PMN AND MONONUCLEAR NO SQUAMOUS EPITHELIAL CELLS SEEN RARE BUDDING YEAST SEEN    Culture RARE Consistent with normal respiratory flora.  Final   Report Status 11/27/2017 FINAL  Final  Respiratory Panel by PCR     Status: None   Collection Time: 11/25/17  2:09 AM  Result Value Ref Range Status   Adenovirus NOT DETECTED NOT DETECTED Final   Coronavirus 229E NOT DETECTED NOT DETECTED Final   Coronavirus HKU1 NOT DETECTED NOT DETECTED Final   Coronavirus NL63 NOT DETECTED NOT DETECTED Final   Coronavirus OC43 NOT DETECTED NOT DETECTED Final   Metapneumovirus NOT DETECTED NOT DETECTED Final   Rhinovirus / Enterovirus NOT DETECTED NOT DETECTED Final   Influenza A NOT DETECTED NOT DETECTED Final   Influenza B NOT DETECTED NOT DETECTED Final   Parainfluenza  Virus 1 NOT DETECTED NOT DETECTED Final   Parainfluenza Virus 2 NOT DETECTED NOT DETECTED Final   Parainfluenza Virus 3 NOT DETECTED NOT DETECTED Final   Parainfluenza Virus 4 NOT DETECTED NOT DETECTED Final   Respiratory Syncytial Virus NOT DETECTED NOT DETECTED Final   Bordetella pertussis NOT DETECTED NOT DETECTED Final   Chlamydophila pneumoniae NOT DETECTED NOT DETECTED Final   Mycoplasma pneumoniae NOT DETECTED NOT DETECTED Final  Culture, bal-quantitative     Status: None   Collection Time: 11/25/17 11:33 AM  Result Value Ref Range Status   Specimen Description BRONCHIAL ALVEOLAR LAVAGE  Final   Special Requests Immunocompromised  Final  Gram Stain   Final    ABUNDANT WBC PRESENT,BOTH PMN AND MONONUCLEAR NO SQUAMOUS EPITHELIAL CELLS SEEN NO ORGANISMS SEEN    Culture NO GROWTH 2 DAYS  Final   Report Status 11/27/2017 FINAL  Final  Acid Fast Smear (AFB)     Status: None   Collection Time: 11/25/17 11:34 AM  Result Value Ref Range Status   AFB Specimen Processing Concentration  Final   Acid Fast Smear Negative  Final    Comment: (NOTE) Performed At: [facility] [place], [place] [id number] Rush Farmer MD NI:[id number]    Source (AFB) BRONCHIAL ALVEOLAR LAVAGE  Final  Fungus Culture With Stain     Status: None (Preliminary result)   Collection Time: 11/25/17 11:34 AM  Result Value Ref Range Status   Fungus Stain Final report  Final    Comment: (NOTE) Performed At: [facility] [place], [place] [id number] Rush Farmer MD QQ:[id number]    Fungus (Mycology) Culture PENDING  Incomplete   Fungal Source BRONCHIAL ALVEOLAR LAVAGE  Final  Fungus Culture Result     Status: None   Collection Time: 11/25/17 11:34 AM  Result Value Ref Range Status   Result 1 Comment  Final    Comment: (NOTE) KOH/Calcofluor preparation:  no fungus observed. Performed At: [facility] [place], [place] [id number] Rush Farmer MD IP:[id number]   Pneumocystis smear by DFA     Status: None   Collection Time: 11/25/17 11:36 AM  Result Value Ref Range Status   Specimen [facility] BRONCHIAL ALVEOLAR LAVAGE  Final   Pneumocystis jiroveci Ag NEGATIVE  Final     Labs: Basic Metabolic Panel: Recent Labs  Lab 11/25/17 0611 11/26/17 0610 11/27/17 0500 11/28/17 1014 11/30/17 0509 12/01/17 0259  NA 139 140 140 142 140 139  K 4.7 4.4 4.4 4.6 4.4 4.4  CL 109 111 110 106 105 104  CO2 21* 21* 21* _0 GLUCOSE 171* 128* 133* 147* 111* 117*  BUN 72* 75* 70* 75* 81* 78*  CREATININE 4.76* 4.58* 4.59* 4.79* 4.95* 4.96*  CALCIUM 8.3* 8.4* 8.6* 9.0 8.8* 9.1  MG 2.0 2.1 2.2 2.3  --   --   PHOS 5.3* 4.8* 4.6 6.1* 5.7* 5.0*   Liver Function Tests: Recent Labs  Lab 11/30/17 0509 12/01/17 0259  ALBUMIN 3.0* 3.3*   No results for input(s): LIPASE, AMYLASE in the last 168 hours. No results for input(s): AMMONIA in the last 168 hours. CBC: Recent Labs  Lab 11/26/17 0610 11/27/17 0500 11/28/17 1014 11/30/17 0509 12/01/17 0308  WBC 12.2* 13.7* 11.7* 6.7 9.5  HGB 10.1* 11.0* 11.2* 11.5* 13.0  HCT 31.1* 33.7* 34.8* 35.9* 39.3  MCV 101.0* 101.5* 99.7 102.0* 100.5*  PLT 176 214 227 201 218   Cardiac Enzymes: Recent Labs  Lab 11/25/17 0611 11/25/17 0816 11/25/17 1950 11/27/17 0144 11/27/17 0500  TROPONINI 0.07* 0.07* 0.05* 0.08* 0.09*   BNP: BNP (last 3 results) Recent Labs    11/22/17 2219 11/24/17 1755  BNP 667.5* 999.1*    ProBNP (last 3 results) No results for input(s): PROBNP in the last 8760 hours.  CBG: Recent Labs  Lab 11/30/17 1535 11/30/17 2015 11/30/17 2345 12/01/17 0349 12/01/17 0803  GLUCAP 104* 193* 103* 134* 153*       Signed:  Navdeep Halt  [facility] 12/01/2017, 10:35 AM

## 2017-12-01 NOTE — Plan of Care (Signed)
Discussed with pt importance of keeping future appts with his PCP.  Pt is ready to be discharged home.  His son will help with pt needs and transportation to appts.

## 2017-12-03 ENCOUNTER — Telehealth: Payer: Self-pay

## 2017-12-03 ENCOUNTER — Telehealth: Payer: Self-pay | Admitting: Family

## 2017-12-03 DIAGNOSIS — J188 Other pneumonia, unspecified organism: Secondary | ICD-10-CM | POA: Diagnosis not present

## 2017-12-03 DIAGNOSIS — J9601 Acute respiratory failure with hypoxia: Secondary | ICD-10-CM | POA: Diagnosis not present

## 2017-12-03 DIAGNOSIS — I5043 Acute on chronic combined systolic (congestive) and diastolic (congestive) heart failure: Secondary | ICD-10-CM | POA: Diagnosis not present

## 2017-12-03 DIAGNOSIS — N185 Chronic kidney disease, stage 5: Secondary | ICD-10-CM | POA: Diagnosis not present

## 2017-12-03 DIAGNOSIS — B20 Human immunodeficiency virus [HIV] disease: Secondary | ICD-10-CM | POA: Diagnosis not present

## 2017-12-03 DIAGNOSIS — I132 Hypertensive heart and chronic kidney disease with heart failure and with stage 5 chronic kidney disease, or end stage renal disease: Secondary | ICD-10-CM | POA: Diagnosis not present

## 2017-12-03 NOTE — Telephone Encounter (Signed)
Noted  

## 2017-12-03 NOTE — Telephone Encounter (Signed)
Left msg to call.

## 2017-12-03 NOTE — Telephone Encounter (Signed)
12/03/17  Transition Care Management Follow-up Telephone Call  ADMISSION DATE:   DISCHARGE DATE:   /  How have you been since you were released from the hospital?  Patient states he is feeling better   Do you understand why you were in the hospital? Yes per patient just does not understand whhy he had to have so many tests before Pneumonia diagnosis was made.    Do you understand the discharge instrcutions? Yes per patient.    Items Reviewed:  Medications reviewed: Yes   Allergies reviewed:Yes   Dietary changes reviewed: Heart Healthy per discharge   Referrals reviewed: Patient has referrals scheduled but refuses follow up with PCP office.   Functional Questionnaire:   Activities of Daily Living (ADLs): Per patient needs assistance with all except feeding himself.  Any patient concerns? None at this time   Confirmed importance and date/time of follow-up visits scheduled: Refused appointment with this office has others scheduled.   Confirmed with patient if condition begins to worsen call PCP or go to the ER.  Yes    Patient was given the office number and encouragred to call back with questions or concerns. Yes

## 2017-12-03 NOTE — Telephone Encounter (Signed)
David [name], could you please contact pt to arrange TCM follow up?

## 2017-12-05 ENCOUNTER — Telehealth: Payer: Self-pay | Admitting: *Deleted

## 2017-12-05 LAB — HIV 1/2 AB DIFFERENTIATION
HIV 1 AB: POSITIVE — AB
HIV 2 Ab: UNDETERMINED

## 2017-12-05 LAB — HIV ANTIBODY (ROUTINE TESTING W REFLEX): HIV SCREEN 4TH GENERATION: REACTIVE — AB

## 2017-12-05 NOTE — Telephone Encounter (Signed)
Verbal auth given for below orders.  Reason for CRM: [facility]ue [facility] with [facility] needing verbal orders for this pt for home health nursing. 2 xs a week for 2 weeks and 1 times a week for 2 weeks. Along with PT. CB#: [phone]

## 2017-12-05 NOTE — Telephone Encounter (Signed)
Left message for pt to call.

## 2017-12-05 NOTE — Telephone Encounter (Signed)
Ok

## 2017-12-06 ENCOUNTER — Ambulatory Visit: Payer: Medicare HMO | Admitting: Physician Assistant

## 2017-12-06 DIAGNOSIS — I5043 Acute on chronic combined systolic (congestive) and diastolic (congestive) heart failure: Secondary | ICD-10-CM | POA: Diagnosis not present

## 2017-12-06 DIAGNOSIS — I132 Hypertensive heart and chronic kidney disease with heart failure and with stage 5 chronic kidney disease, or end stage renal disease: Secondary | ICD-10-CM | POA: Diagnosis not present

## 2017-12-06 DIAGNOSIS — B20 Human immunodeficiency virus [HIV] disease: Secondary | ICD-10-CM | POA: Diagnosis not present

## 2017-12-06 DIAGNOSIS — N185 Chronic kidney disease, stage 5: Secondary | ICD-10-CM | POA: Diagnosis not present

## 2017-12-06 DIAGNOSIS — J188 Other pneumonia, unspecified organism: Secondary | ICD-10-CM | POA: Diagnosis not present

## 2017-12-06 DIAGNOSIS — J9601 Acute respiratory failure with hypoxia: Secondary | ICD-10-CM | POA: Diagnosis not present

## 2017-12-06 NOTE — Telephone Encounter (Signed)
2 ATTEMPTS MADE FOR TOC CALL. PATIENT switch appt time to 12/13/16 per [facility] APPTS  ENCOUNTER CLOSED

## 2017-12-11 DIAGNOSIS — J9601 Acute respiratory failure with hypoxia: Secondary | ICD-10-CM | POA: Diagnosis not present

## 2017-12-11 DIAGNOSIS — N185 Chronic kidney disease, stage 5: Secondary | ICD-10-CM | POA: Diagnosis not present

## 2017-12-11 DIAGNOSIS — I132 Hypertensive heart and chronic kidney disease with heart failure and with stage 5 chronic kidney disease, or end stage renal disease: Secondary | ICD-10-CM | POA: Diagnosis not present

## 2017-12-11 DIAGNOSIS — B20 Human immunodeficiency virus [HIV] disease: Secondary | ICD-10-CM | POA: Diagnosis not present

## 2017-12-11 DIAGNOSIS — J188 Other pneumonia, unspecified organism: Secondary | ICD-10-CM | POA: Diagnosis not present

## 2017-12-11 DIAGNOSIS — I5043 Acute on chronic combined systolic (congestive) and diastolic (congestive) heart failure: Secondary | ICD-10-CM | POA: Diagnosis not present

## 2017-12-12 NOTE — Progress Notes (Signed)
Cardiology Office Note   Date:  12/13/2017   ID:  David [name], DOB Oct 06, 1941, MRN [id number]  PCP:  [name], NP  Cardiologist: [name], MD Chief Complaint  Patient presents with  . Hospitalization Follow-up  . Hypertension  . Cardiomyopathy     History of Present Illness: David [name] is a 77 y.o. male who presents for ongoing assessment and management of nonischemic cardiomyopathy, (normal coronary angiography in 2004), hypertension, with history of chronic kidney disease.  Patient was last seen in the office on 09/14/2017 by [name], noted that she did have some shortness of breath and orthostatic dizziness.  He noted that her LV systolic function had deteriorated and she was currently having New York Heart Association functional class II dyspnea.  Left ventricle: The cavity size was severely dilated. Wall   thickness was increased in a pattern of mild LVH. Systolic   function was severely reduced. The estimated ejection fraction   was in the range of 20% to 25%. Diffuse hypokinesis. Features are   consistent with a pseudonormal left ventricular filling pattern,   with concomitant abnormal relaxation and increased filling   pressure (grade 2 diastolic dysfunction). - Aortic valve: There was mild regurgitation. - Mitral valve: There was moderate regurgitation. - Left atrium: The atrium was moderately dilated. - Right ventricle: The cavity size was mildly dilated. - Right atrium: The atrium was mildly dilated.  Impressions:  - Severe global reduction in LV systolic function; moderate   diastolic dysfunction; mild LVH; 4 chamber enlargement; mild AI;   moderate MR.  Due to the chronic kidney disease, at high risk for nephrotoxicity, cardiac catheterization was not planned.  The patient was not able to be placed on ACE inhibitor or ARB or Entresto in the setting of chronic kidney disease as well.  He also is discussed the option of CRT-D therapy  but because this involved contrast exposure and risk of speeding a progression of end-stage renal disease but this was delayed until becomes absolutely necessary.  Unfortunately, the patient was admitted to the hospital on 11/22/2017 with pneumonia.  The patient was successfully treated with pneumonia and was euvolemic on discharge.  He is here for posthospitalization follow-up.  He comes today with generalized fatigue, he is undergoing physical rehab at home.  He also has a home health nurse that monitors his weight and make sure he has his medications appropriately.  The patient is without complaints of dizziness, palpitations, or chest pain.  He also denies any fluid retention or weight gain.  Past Medical History:  Diagnosis Date  . Anemia, iron deficiency 11/15/2011  . Arthritis   . Cancer [facility])    hx of prostate; s/p radioactive seed implant 10/2009 Dr Janice Norrie  . Chronic kidney disease   . Glaucoma   . Heart murmur    "mild" per pt  . History of cardiac cath 2004   negative for CAD  . History of myocardial perfusion scan 02/2010   negative for coronary insufficiency (LVEF 27%)  . HIV infection ([place])   . Hyperlipidemia   . Hypertension    followed by [facility] Vascular (Dr Dani Gobble Croitoru)  . Nonischemic cardiomyopathy ([place])   . Sinus bradycardia   . Stroke [facility])    "mini stroke" years ago  . Systolic and diastolic CHF, acute ([place]) 06/2010   felt to be secondary to hypertensive cardiomyopathy    Past Surgical History:  Procedure Laterality Date  . AV FISTULA PLACEMENT Right 10/13/2016  Procedure: ARTERIOVENOUS (AV) FISTULA CREATION;  Surgeon: [name], MD;  Location: [place];  Service: Vascular;  Laterality: Right;  . BASCILIC VEIN TRANSPOSITION Left 06/24/2015   Procedure: BASILIC VEIN TRANSPOSITION;  Surgeon: Mal Misty, MD;  Location: [place];  Service: Vascular;  Laterality: Left;  . CARDIAC CATHETERIZATION  02/04/2003   mildly depressed LV systolic fx EF  19%,JYNWGN coronaries/abdominal aorta/renal arteries.  . COLONOSCOPY    . ESOPHAGOGASTRODUODENOSCOPY (EGD) WITH PROPOFOL N/A 05/05/2014   Procedure: ESOPHAGOGASTRODUODENOSCOPY (EGD) WITH PROPOFOL;  Surgeon: Missy Sabins, MD;  Location: [facility];  Service: Endoscopy;  Laterality: N/A;  . EYE SURGERY Bilateral    cataract surgery   . EYE SURGERY Bilateral    glaucoma surgery  . NM MYOCAR PERF WALL MOTION  02/21/2010   normal  . RADIOACTIVE SEED IMPLANT  2010   prostate cancer  . US ECHOCARDIOGRAPHY  02/06/2012   mild LVH,LA mod. dilated,mild-mod. 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 AI.     Current Outpatient Medications  Medication Sig Dispense Refill  . acetaminophen (TYLENOL) 500 MG tablet Take 500 mg by mouth every 4 (four) hours as needed (for muscle pain).     Marland Kitchen amLODipine (NORVASC) 10 MG tablet TAKE 1 TABLET EVERY DAY (Patient taking differently: Take 10 mg by mouth once a day) 90 tablet 3  . aspirin 81 MG chewable tablet Place 1 tablet (81 mg total) into feeding tube daily.    . brimonidine (ALPHAGAN) 0.15 % ophthalmic solution Place 1 drop into the left eye 2 (two) times daily.   1  . carvedilol (COREG) 25 MG tablet TAKE 1 TABLET TWICE DAILY (Patient taking differently: Take 25 mg by mouth two times a day) 180 tablet 3  . dolutegravir (TIVICAY) 50 MG tablet Take 1 tablet (50 mg total) by mouth daily. 30 tablet 11  . dorzolamide-timolol (COSOPT) 22.3-6.8 MG/ML ophthalmic solution Place 1 drop into the left eye 2 (two) times daily.     Marland Kitchen emtricitabine-tenofovir AF (DESCOVY) 200-25 MG tablet Take 1 tablet by mouth daily. 30 tablet 11  . furosemide (LASIX) 40 MG tablet Take 2 tablets (80 mg total) by mouth daily. (Patient taking differently: Take 80 mg by mouth every morning. ) 180 tablet 3  . latanoprost (XALATAN) 0.005 % ophthalmic solution Place 1 drop into the left eye at bedtime.     . ranitidine (ZANTAC) 150 MG tablet Take 1 tablet (150 mg total) by  mouth 2 (two) times daily. 60 tablet 2  . Vitamin D, Ergocalciferol, (DRISDOL) 50000 UNITS CAPS capsule Take 50,000 units by mouth once a week on Saturdays  2   No current facility-administered medications for this visit.     Allergies:   Codeine and Cozaar [losartan potassium]    Social History:  The patient  reports that he quit smoking about 12 years ago. he has never used smokeless tobacco. He reports that he drinks alcohol. He reports that he does not use drugs.   Family History:  The patient's family history includes Cholelithiasis in his daughter and son; Diabetes in his maternal grandmother; Hypertension in his maternal grandmother and mother; Thyroid disease in his mother.    ROS: All other systems are reviewed and negative. Unless otherwise mentioned in H&P    PHYSICAL EXAM: VS:  BP 128/74   Pulse 73   Ht 6\' 2"  (1.88 m)   Wt 205 lb 3.2 oz (93.1 kg)   BMI 26.35 kg/m  , BMI  Body mass index is 26.35 kg/m. GEN: Well nourished, well developed, in no acute distress  HEENT: normal  Neck: no JVD, carotid bruits, or masses Cardiac: RRR; distant heart sounds no murmurs, rubs, or gallops,no edema  Respiratory:  clear to auscultation bilaterally, normal work of breathing GI: soft, nontender, nondistended, + BS MS: no deformity or atrophy the patient has some generalized weakness and uses cane for ambulation Skin: warm and dry, no rash Neuro:  Strength and sensation are intact Psych: euthymic mood, full affect  Recent Labs: 07/31/2017: TSH 1.10 11/22/2017: ALT 20 11/24/2017: B Natriuretic Peptide 999.1 11/28/2017: Magnesium 2.3 12/01/2017: BUN 78; Creatinine, Ser 4.96; Hemoglobin 13.0; Platelets 218; Potassium 4.4; Sodium 139    Lipid Panel    Component Value Date/Time   CHOL 154 05/22/2017 0926   TRIG 62 05/22/2017 0926   HDL 47 05/22/2017 0926   CHOLHDL 3.3 05/22/2017 0926   VLDL 12 05/22/2017 0926   LDLCALC 95 05/22/2017 0926      Wt Readings from Last 3  Encounters:  12/13/17 205 lb 3.2 oz (93.1 kg)  12/01/17 194 lb 10.7 oz (88.3 kg)  10/05/17 212 lb 12.8 oz (96.5 kg)    ASSESSMENT AND PLAN:  1.  Combined HFrEF: Recent echocardiogram revealing EF of 20% to 25% with diffuse hypokinesis, grade 2 diastolic dysfunction.  The patient is currently asymptomatic and euvolemic.  He remains on 80 mg of Lasix daily, along with amlodipine and carvedilol 25 mg twice daily.  Although deconditioned, he denies chest pain or exertional dyspnea.  The patient will continue current medication regimen he is not a candidate for ACE/arm/Entresto due to renal insufficiency.  Would like to see his blood pressure lower in the setting of reduced ejection fraction.  Can consider hydralazine 25 mg 3 times daily on follow-up is blood pressure is not optimal.  No coronary angiogram has been planned due to risk of nephrotoxicity despite multiple coronary risk factors.  Continue medical management  2.  Essential hypertension: As stated above due to reduced EF blood pressure is not optimal at this time.  May consider adding hydralazine when seen again if it it remains above normal for his current LV function.  3.  Chronic left bundle branch block: Unchanged.  Discussion for CRT therapy was noted in last office visit by [name].  He is currently asymptomatic for dizziness, rapid heart rhythm, or near syncope.  This can be readdressed on follow-up as at this time, he is asymptomatic with need for contrast dye replacement.  He should not does begin to undergo hemodialysis this may be able to be revisited..   4.  End-stage renal disease: Approaching need for dialysis followed by nephrology  Current medicines are reviewed at length with the patient today.    Labs/ tests ordered today include: None Phill Myron. [place] Pugh, ANP, AACC   12/13/2017 12:14 PM     [facility] [address] [place], [place] [address] Phone: [phone]; Fax: [phone]

## 2017-12-13 ENCOUNTER — Ambulatory Visit: Payer: Medicare HMO | Admitting: Adult Health

## 2017-12-13 ENCOUNTER — Encounter: Payer: Self-pay | Admitting: Adult Health

## 2017-12-13 VITALS — BP 128/74 | HR 73 | Ht 74.0 in | Wt 205.2 lb

## 2017-12-13 DIAGNOSIS — B20 Human immunodeficiency virus [HIV] disease: Secondary | ICD-10-CM | POA: Diagnosis not present

## 2017-12-13 DIAGNOSIS — N184 Chronic kidney disease, stage 4 (severe): Secondary | ICD-10-CM | POA: Diagnosis not present

## 2017-12-13 DIAGNOSIS — N2581 Secondary hyperparathyroidism of renal origin: Secondary | ICD-10-CM | POA: Diagnosis not present

## 2017-12-13 DIAGNOSIS — J188 Other pneumonia, unspecified organism: Secondary | ICD-10-CM | POA: Diagnosis not present

## 2017-12-13 DIAGNOSIS — N185 Chronic kidney disease, stage 5: Secondary | ICD-10-CM | POA: Diagnosis not present

## 2017-12-13 DIAGNOSIS — Z79899 Other long term (current) drug therapy: Secondary | ICD-10-CM

## 2017-12-13 DIAGNOSIS — I5043 Acute on chronic combined systolic (congestive) and diastolic (congestive) heart failure: Secondary | ICD-10-CM | POA: Diagnosis not present

## 2017-12-13 DIAGNOSIS — N189 Chronic kidney disease, unspecified: Secondary | ICD-10-CM | POA: Diagnosis not present

## 2017-12-13 DIAGNOSIS — J9601 Acute respiratory failure with hypoxia: Secondary | ICD-10-CM | POA: Diagnosis not present

## 2017-12-13 DIAGNOSIS — I132 Hypertensive heart and chronic kidney disease with heart failure and with stage 5 chronic kidney disease, or end stage renal disease: Secondary | ICD-10-CM | POA: Diagnosis not present

## 2017-12-13 LAB — BASIC METABOLIC PANEL
BUN/Creatinine Ratio: 13 (ref 10–24)
BUN: 54 mg/dL — AB (ref 8–27)
CALCIUM: 9.2 mg/dL (ref 8.6–10.2)
CO2: 19 mmol/L — AB (ref 20–29)
Chloride: 105 mmol/L (ref 96–106)
Creatinine, Ser: 4.17 mg/dL — ABNORMAL HIGH (ref 0.76–1.27)
GFR, EST AFRICAN AMERICAN: 15 mL/min/{1.73_m2} — AB (ref >59)
GFR, EST NON AFRICAN AMERICAN: 13 mL/min/{1.73_m2} — AB (ref >59)
Glucose: 118 mg/dL — ABNORMAL HIGH (ref 65–99)
Potassium: 4.5 mmol/L (ref 3.5–5.2)
Sodium: 141 mmol/L (ref 134–144)

## 2017-12-13 NOTE — Progress Notes (Signed)
Hydralazine/nitrates is probably a good idea.  Better choice than amlodipine.  When is he coming back? MCr

## 2017-12-13 NOTE — Patient Instructions (Addendum)
Medication Instructions:  NO CHANGES-Your physician recommends that you continue on your current medications as directed. Please refer to the Current Medication list given to you today.  If you need a refill on your cardiac medications before your next appointment, please call your pharmacy.  Labwork: BMET TODAY HERE IN OUR OFFICE AT [place] may go to any [facility] lab that is convenient for you however, we do have a lab in our office that is able to assist you. You do NOT need an appointment for our lab. Once in our office lobby there is a podium to the right of the check-in desk where you are to sign-in and ring a doorbell to alert [place] you are here. Lab is open Monday-Friday from 8:00am to 4:00pm; and is closed for lunch from 12:45p-1:45pm   Follow-Up: Your physician wants you to follow-up in: [place]   Thank you for choosing [facility] at [facility]!!

## 2017-12-17 ENCOUNTER — Other Ambulatory Visit: Payer: Medicare HMO

## 2017-12-17 DIAGNOSIS — N289 Disorder of kidney and ureter, unspecified: Secondary | ICD-10-CM

## 2017-12-17 DIAGNOSIS — B20 Human immunodeficiency virus [HIV] disease: Secondary | ICD-10-CM | POA: Diagnosis not present

## 2017-12-17 DIAGNOSIS — I420 Dilated cardiomyopathy: Secondary | ICD-10-CM | POA: Diagnosis not present

## 2017-12-17 DIAGNOSIS — R7309 Other abnormal glucose: Secondary | ICD-10-CM | POA: Diagnosis not present

## 2017-12-17 DIAGNOSIS — Z113 Encounter for screening for infections with a predominantly sexual mode of transmission: Secondary | ICD-10-CM

## 2017-12-17 DIAGNOSIS — A529 Late syphilis, unspecified: Secondary | ICD-10-CM

## 2017-12-17 NOTE — Progress Notes (Signed)
Thanks MCr 

## 2017-12-18 ENCOUNTER — Telehealth: Payer: Self-pay | Admitting: *Deleted

## 2017-12-18 DIAGNOSIS — J188 Other pneumonia, unspecified organism: Secondary | ICD-10-CM | POA: Diagnosis not present

## 2017-12-18 DIAGNOSIS — J9601 Acute respiratory failure with hypoxia: Secondary | ICD-10-CM | POA: Diagnosis not present

## 2017-12-18 DIAGNOSIS — B20 Human immunodeficiency virus [HIV] disease: Secondary | ICD-10-CM | POA: Diagnosis not present

## 2017-12-18 DIAGNOSIS — I5043 Acute on chronic combined systolic (congestive) and diastolic (congestive) heart failure: Secondary | ICD-10-CM | POA: Diagnosis not present

## 2017-12-18 DIAGNOSIS — I132 Hypertensive heart and chronic kidney disease with heart failure and with stage 5 chronic kidney disease, or end stage renal disease: Secondary | ICD-10-CM | POA: Diagnosis not present

## 2017-12-18 DIAGNOSIS — N185 Chronic kidney disease, stage 5: Secondary | ICD-10-CM | POA: Diagnosis not present

## 2017-12-18 LAB — COMPLETE METABOLIC PANEL WITH GFR
AG Ratio: 1.3 (calc) (ref 1.0–2.5)
ALKALINE PHOSPHATASE (APISO): 90 U/L (ref 40–115)
ALT: 24 U/L (ref 9–46)
AST: 13 U/L (ref 10–35)
Albumin: 4 g/dL (ref 3.6–5.1)
BUN/Creatinine Ratio: 13 (calc) (ref 6–22)
BUN: 63 mg/dL — AB (ref 7–25)
CALCIUM: 9.3 mg/dL (ref 8.6–10.3)
CO2: 26 mmol/L (ref 20–32)
CREATININE: 4.84 mg/dL — AB (ref 0.70–1.18)
Chloride: 106 mmol/L (ref 98–110)
GFR, Est African American: 13 mL/min/{1.73_m2} — ABNORMAL LOW (ref >=60)
GFR, Est Non African American: 11 mL/min/{1.73_m2} — ABNORMAL LOW (ref >=60)
GLUCOSE: 126 mg/dL — AB (ref 65–99)
Globulin: 3.2 g/dL (calc) (ref 1.9–3.7)
Potassium: 4.6 mmol/L (ref 3.5–5.3)
Sodium: 140 mmol/L (ref 135–146)
Total Bilirubin: 0.5 mg/dL (ref 0.2–1.2)
Total Protein: 7.2 g/dL (ref 6.1–8.1)

## 2017-12-18 LAB — T-HELPER CELL (CD4) - (RCID CLINIC ONLY)
CD4 T CELL HELPER: 12 % — AB (ref 33–55)
CD4 T Cell Abs: 300 /uL — ABNORMAL LOW (ref 400–2700)

## 2017-12-18 LAB — CBC WITH DIFFERENTIAL/PLATELET
BASOS ABS: 61 {cells}/uL (ref 0–200)
Basophils Relative: 1.2 %
EOS PCT: 8.3 %
Eosinophils Absolute: 423 cells/uL (ref 15–500)
HCT: 33.9 % — ABNORMAL LOW (ref 38.5–50.0)
HEMOGLOBIN: 11.4 g/dL — AB (ref 13.2–17.1)
Lymphs Abs: 1265 cells/uL (ref 850–3900)
MCH: 33 pg (ref 27.0–33.0)
MCHC: 33.6 g/dL (ref 32.0–36.0)
MCV: 98.3 fL (ref 80.0–100.0)
MONOS PCT: 7.1 %
MPV: 10.4 fL (ref 7.5–12.5)
NEUTROS ABS: 2989 {cells}/uL (ref 1500–7800)
NEUTROS PCT: 58.6 %
Platelets: 258 10*3/uL (ref 140–400)
RBC: 3.45 10*6/uL — ABNORMAL LOW (ref 4.20–5.80)
RDW: 13.1 % (ref 11.0–15.0)
Total Lymphocyte: 24.8 %
WBC mixed population: 362 cells/uL (ref 200–950)
WBC: 5.1 10*3/uL (ref 3.8–10.8)

## 2017-12-18 LAB — FLUORESCENT TREPONEMAL AB(FTA)-IGG-BLD: Fluorescent Treponemal ABS: REACTIVE — AB

## 2017-12-18 LAB — RPR: RPR: REACTIVE — AB

## 2017-12-18 LAB — RPR TITER: RPR Titer: 1:2 {titer} — ABNORMAL HIGH

## 2017-12-18 LAB — MICROALBUMIN / CREATININE URINE RATIO
CREATININE, URINE: 126 mg/dL (ref 20–320)
Microalb Creat Ratio: 17 mcg/mg creat (ref <30)
Microalb, Ur: 2.2 mg/dL

## 2017-12-18 NOTE — Telephone Encounter (Signed)
Received Home Health Certification and Plan of Care; forwarded to provider/SLS 01/15  

## 2017-12-19 DIAGNOSIS — N184 Chronic kidney disease, stage 4 (severe): Secondary | ICD-10-CM | POA: Diagnosis not present

## 2017-12-19 DIAGNOSIS — I129 Hypertensive chronic kidney disease with stage 1 through stage 4 chronic kidney disease, or unspecified chronic kidney disease: Secondary | ICD-10-CM | POA: Diagnosis not present

## 2017-12-19 DIAGNOSIS — D631 Anemia in chronic kidney disease: Secondary | ICD-10-CM | POA: Diagnosis not present

## 2017-12-19 DIAGNOSIS — N2581 Secondary hyperparathyroidism of renal origin: Secondary | ICD-10-CM | POA: Diagnosis not present

## 2017-12-19 LAB — HIV-1 RNA QUANT-NO REFLEX-BLD
HIV 1 RNA QUANT: 28 {copies}/mL — AB
HIV-1 RNA QUANT, LOG: 1.45 {Log_copies}/mL — AB

## 2017-12-20 DIAGNOSIS — I132 Hypertensive heart and chronic kidney disease with heart failure and with stage 5 chronic kidney disease, or end stage renal disease: Secondary | ICD-10-CM | POA: Diagnosis not present

## 2017-12-20 DIAGNOSIS — J188 Other pneumonia, unspecified organism: Secondary | ICD-10-CM | POA: Diagnosis not present

## 2017-12-20 DIAGNOSIS — B20 Human immunodeficiency virus [HIV] disease: Secondary | ICD-10-CM | POA: Diagnosis not present

## 2017-12-20 DIAGNOSIS — N185 Chronic kidney disease, stage 5: Secondary | ICD-10-CM | POA: Diagnosis not present

## 2017-12-20 DIAGNOSIS — I5043 Acute on chronic combined systolic (congestive) and diastolic (congestive) heart failure: Secondary | ICD-10-CM | POA: Diagnosis not present

## 2017-12-20 DIAGNOSIS — J9601 Acute respiratory failure with hypoxia: Secondary | ICD-10-CM | POA: Diagnosis not present

## 2017-12-26 LAB — FUNGUS CULTURE WITH STAIN

## 2017-12-26 LAB — FUNGAL ORGANISM REFLEX

## 2017-12-26 LAB — FUNGUS CULTURE RESULT

## 2017-12-27 ENCOUNTER — Encounter: Payer: Self-pay | Admitting: Cardiovascular Disease

## 2017-12-27 ENCOUNTER — Ambulatory Visit: Payer: Medicare HMO | Admitting: Cardiovascular Disease

## 2017-12-27 VITALS — BP 120/70 | HR 80 | Ht 74.0 in | Wt 207.6 lb

## 2017-12-27 DIAGNOSIS — I10 Essential (primary) hypertension: Secondary | ICD-10-CM | POA: Diagnosis not present

## 2017-12-27 DIAGNOSIS — I447 Left bundle-branch block, unspecified: Secondary | ICD-10-CM | POA: Diagnosis not present

## 2017-12-27 DIAGNOSIS — E785 Hyperlipidemia, unspecified: Secondary | ICD-10-CM

## 2017-12-27 DIAGNOSIS — I5043 Acute on chronic combined systolic (congestive) and diastolic (congestive) heart failure: Secondary | ICD-10-CM | POA: Diagnosis not present

## 2017-12-27 DIAGNOSIS — B20 Human immunodeficiency virus [HIV] disease: Secondary | ICD-10-CM

## 2017-12-27 DIAGNOSIS — M10372 Gout due to renal impairment, left ankle and foot: Secondary | ICD-10-CM

## 2017-12-27 DIAGNOSIS — N184 Chronic kidney disease, stage 4 (severe): Secondary | ICD-10-CM

## 2017-12-27 MED ORDER — COLCHICINE 0.6 MG PO TABS: 0.6000 mg | ORAL_TABLET | Freq: Every day | ORAL | 0 refills | Status: DC

## 2017-12-27 NOTE — Progress Notes (Signed)
Patient ID: David [name], male   DOB: 1941/08/08, 77 y.o.   MRN: [id number]    Cardiology Office Note    Date:  12/28/2017   ID:  David [name], DOB Jul 11, 1941, MRN [id number]  PCP:  David Alar, NP  Cardiologist:   David Klein, MD   Chief Complaint  Patient presents with  . Discuss lab results    pt c/o pain in left ankle/foot; denies CP, dizziness, swelling    History of Present Illness:  David [name] is a 77 y.o. male Hypertension, nonischemic dilated cardiomyopathy and, chronic kidney disease  David [name] has a long-standing history of nonischemic cardiomyopathy (normal coronary angio 2004) with September 2018 echo showing EF has dropped again to 25-30% he has an atypical left bundle branch block. 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  He has had 2 failed attempts at placement of dialysis access.  He tells me that his nephrologist is in the process of placing him on a transplant list.  He is HIV positive, with undetectable viral load on antiretroviral therapy, monitored by Dr. Tommy [name].  Until recently frank he was walking half a mile every day without complaints of dyspnea or angina.  He still complains of shortness of breath during sexual intercourse or more intense physical activity.  A few days ago he developed severe pain in his left toe which slowly subsided but now he has severe pain in his right ankle and he was limping slowly into the office today.  He said that the toe was exquisitely tender to touch and swollen.  He has never had gout before.  We increase his diuretics recently.  The patient specifically denies any chest pain at rest or exertion, dyspnea at rest, orthopnea, paroxysmal nocturnal dyspnea, syncope, palpitations, focal neurological deficits, intermittent claudication, lower extremity edema, unexplained weight gain, cough, hemoptysis or wheezing.  He has HIV-AIDS, diagnosed only when his CD4 had already  dropped to 44 and he was losing weight. After treatment he had undetectable viral load in June 2018. Repeat assay in August showed detectable virus and his medications have been adjusted..  Past Medical History:  Diagnosis Date  . Anemia, iron deficiency 11/15/2011  . Arthritis   . Cancer [facility])    hx of prostate; s/p radioactive seed implant 10/2009 Dr Janice [name]  . Chronic kidney disease   . Glaucoma   . Heart murmur    "mild" per pt  . History of cardiac cath 2004   negative for CAD  . History of myocardial perfusion scan 02/2010   negative for coronary insufficiency (LVEF 27%)  . HIV infection ([place])   . Hyperlipidemia   . Hypertension    followed by [facility] and Vascular (Dr Dani Gobble Kaiyana Bedore)  . Nonischemic cardiomyopathy ([place])   . Sinus bradycardia   . Stroke [facility])    "mini stroke" years ago  . Systolic and diastolic CHF, acute ([facility]) 06/2010   felt to be secondary to hypertensive cardiomyopathy    Past Surgical History:  Procedure Laterality Date  . AV FISTULA PLACEMENT Right 10/13/2016   Procedure: ARTERIOVENOUS (AV) FISTULA CREATION;  Surgeon: [name], MD;  Location: [place];  Service: Vascular;  Laterality: Right;  . BASCILIC VEIN TRANSPOSITION Left 06/24/2015   Procedure: BASILIC VEIN TRANSPOSITION;  Surgeon: Mal Misty, MD;  Location: [place];  Service: Vascular;  Laterality: Left;  . CARDIAC CATHETERIZATION  02/04/2003   mildly depressed LV systolic fx EF 76%,AUQJFH coronaries/abdominal  aorta/renal arteries.  . COLONOSCOPY    . ESOPHAGOGASTRODUODENOSCOPY (EGD) WITH PROPOFOL N/A 05/05/2014   Procedure: ESOPHAGOGASTRODUODENOSCOPY (EGD) WITH PROPOFOL;  Surgeon: Missy Sabins, MD;  [facility];  Service: Endoscopy;  Laterality: N/A;  . EYE SURGERY Bilateral    cataract surgery   . EYE SURGERY Bilateral    glaucoma surgery  . NM MYOCAR PERF WALL MOTION  02/21/2010   normal  . RADIOACTIVE SEED IMPLANT  2010   prostate cancer  . US ECHOCARDIOGRAPHY  02/06/2012    mild LVH,LA mod. dilated,mild-mod. 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 AI.    Current Outpatient Medications  Medication Sig Dispense Refill  . acetaminophen (TYLENOL) 500 MG tablet Take 500 mg by mouth every 4 (four) hours as needed (for muscle pain).     Marland Kitchen amLODipine (NORVASC) 10 MG tablet TAKE 1 TABLET EVERY DAY (Patient taking differently: Take 10 mg by mouth once a day) 90 tablet 3  . aspirin 81 MG chewable tablet Place 1 tablet (81 mg total) into feeding tube daily.    . brimonidine (ALPHAGAN) 0.15 % ophthalmic solution Place 1 drop into the left eye 2 (two) times daily.   1  . carvedilol (COREG) 25 MG tablet TAKE 1 TABLET TWICE DAILY (Patient taking differently: Take 25 mg by mouth two times a day) 180 tablet 3  . dolutegravir (TIVICAY) 50 MG tablet Take 1 tablet (50 mg total) by mouth daily. 30 tablet 11  . dorzolamide-timolol (COSOPT) 22.3-6.8 MG/ML ophthalmic solution Place 1 drop into the left eye 2 (two) times daily.     Marland Kitchen emtricitabine-tenofovir AF (DESCOVY) 200-25 MG tablet Take 1 tablet by mouth daily. 30 tablet 11  . furosemide (LASIX) 40 MG tablet Take 2 tablets (80 mg total) by mouth daily. (Patient taking differently: Take 80 mg by mouth every morning. ) 180 tablet 3  . latanoprost (XALATAN) 0.005 % ophthalmic solution Place 1 drop into the left eye at bedtime.     . Vitamin D, Ergocalciferol, (DRISDOL) 50000 UNITS CAPS capsule Take 50,000 units by mouth once a week on Saturdays  2  . colchicine 0.6 MG tablet Take 1 tablet (0.6 mg total) by mouth daily for 7 days. 30 tablet 0   No current facility-administered medications for this visit.     Allergies:   Codeine and Cozaar [losartan potassium]   Social History   Socioeconomic History  . Marital status: Married    Spouse name: None  . Number of children: 2  . Years of education: 27  . Highest education level: None  Social Needs  . Financial resource strain: None  . Food insecurity -  worry: None  . Food insecurity - inability: None  . Transportation needs - medical: None  . Transportation needs - non-medical: None  Occupational History  . 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 Smoking status: Former Smoker    Last attempt to quit: 12/04/2005    Years since quitting: 12.0  . Smokeless tobacco: Never Used  Substance and Sexual Activity  . Alcohol use: Yes    Comment: once a week-wine  . Drug use: No  . Sexual activity: None  Other Topics Concern  . None  Social History Narrative   Stays active at home   Regular exercise: no   Drinks 2 cups of coffee a week, 1 mountain dew soda a day.     Family History:  The  patient's family history includes Cholelithiasis in his daughter and son; Diabetes in his maternal grandmother; Hypertension in his maternal grandmother and mother; Thyroid disease in his mother.   ROS:   Please see the history of present illness.    Review of Systems  All other systems reviewed and are negative.    PHYSICAL EXAM:   VS:  BP 120/70 (BP Location: Left Arm, Patient Position: Sitting, Cuff Size: Large)   Pulse 80   Ht 6\' 2"  (1.88 m)   Wt 207 lb 9.6 oz (94.2 kg)   BMI 26.65 kg/m      General: Alert, oriented x3, no distress, he looks very uncomfortable when he tries to walk and limp slowly favoring his right leg Head: no evidence of trauma, PERRL, EOMI, no exophtalmos or lid lag, no myxedema, no xanthelasma; normal ears, nose and oropharynx Neck: normal jugular venous pulsations and no hepatojugular reflux; brisk carotid pulses without delay and no carotid bruits Chest: clear to auscultation, no signs of consolidation by percussion or palpation, normal fremitus, symmetrical and full respiratory excursions Cardiovascular: normal position and quality of the apical impulse, regular rhythm, normal first and paradoxically split second heart sounds, no murmurs, rubs or gallops Abdomen: no tenderness or  distention, no masses by palpation, no abnormal pulsatility or arterial bruits, normal bowel sounds, no hepatosplenomegaly Extremities: no clubbing, cyanosis; mild symmetrical ankle edema;, the external surface of the right ankle is very tender to touch, 2+ radial, ulnar and brachial pulses bilaterally; 2+ right femoral, posterior tibial and dorsalis pedis pulses; 2+ left femoral, posterior tibial and dorsalis pedis pulses; no subclavian or femoral bruits Neurological: grossly nonfocal Psych: Normal mood and affect  Wt Readings from Last 3 Encounters:  12/27/17 207 lb 9.6 oz (94.2 kg)  12/13/17 205 lb 3.2 oz (93.1 kg)  12/01/17 194 lb 10.7 oz (88.3 kg)      Studies/Labs Reviewed:   EKG:  EKG is not ordered today.  The ekg ordered at his last appointment demonstrates Normal sinus rhythm with a couple of PVCs, mild first-degree AV block (210 ms his QRS which had a pattern of LVH is now even broader at 132 ms with a left bundle branch block morphology and left axis deviation. QTC 462 ms  Recent Labs: 07/31/2017: TSH 1.10 11/24/2017: B Natriuretic Peptide 999.1 11/28/2017: Magnesium 2.3 12/17/2017: ALT 24; BUN 63; Creat 4.84; Hemoglobin 11.4; Platelets 258; Potassium 4.6; Sodium 140   Lipid Panel    Component Value Date/Time   CHOL 154 05/22/2017 0926   TRIG 62 05/22/2017 0926   HDL 47 05/22/2017 0926   CHOLHDL 3.3 05/22/2017 0926   VLDL 12 05/22/2017 0926   LDLCALC 95 05/22/2017 0926     ASSESSMENT:    1. Acute on chronic combined systolic and diastolic CHF (congestive heart failure) ([place])   2. 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 Essential hypertension   4. Chronic kidney disease, stage 4 (severe) (H[facility])   5. Dyslipidemia   6. Left bundle branch block   7. AIDS (acquired immune deficiency syndrome) ([place])      PLAN:  In order of problems listed above:  1. CHF: Left ventricular systolic function has deteriorated again. NYHA functional class II.   He has really not lost any weight, but while we increase his diuretics he developed what sounds like gout or pseudogout.  As expected, it is increasingly difficult to maintain the balance between compensation of heart failure and renal failure.  Despite numerous coronary risk factors, when his cardiomyopathy began he did not have any evidence of coronary disease. He does not have angina or EKG changes to support ischemia. I don't think the risk of nephrotoxicity allows [place] to perform coronary angiography safely, unless he is already on dialysis. For the same reason he is unable to take ACE inhibitor/ARB/Entresto.  We will hold off increasing his diuretics any further until we can control his joint pain.  2. Probable gout: Gave him a prescription for colchicine and will check uric acid level.  With a short course of colchicine, hopefully will not have toxic side effects related to renal insufficiency. I cannot find any documentation of uric acid level in his previous labs.  Avoid NSAIDs due to renal failure.  Could give a short course of steroids, but would like to avoid this with immunosuppression from HIV. 3. HTN: well controlled - presumed to be primary etiology for his cardiomyopathy 4. CKD 4: Approaching ESRD, needs HD access or kidney transplantation; I do not think he will be a candidate for transplantation with his current worsening cardiomyopathy and I suspect dialysis will eventually be the only solution. 5. HLP: Even though he is receiving protease inhibitors he has a favorable lipid profile 6. LBBB: Option for CRT-D therapy, but this will also involve contrast exposure and risk of speeding up the progression to end-stage renal disease. Although he does have a typical left bundle branch block pattern, his QRS is really not that broad. Overall likely to respond to CRT, will discuss this if diuretic therapy fails to control his symptoms. 7. HIV: Previously controlled but now with detectable viral load,  followed by Dr. Tommy [name].  Checked for drug interactions with colchicine and his antiretrovirals and did not see any problems  Medication Adjustments/Labs and Tests Ordered: Current medicines are reviewed at length with the patient today.  Concerns regarding medicines are outlined above.  Medication changes, Labs and Tests ordered today are listed below. Patient Instructions  Dr Sallyanne Kuster has recommended making the following medication changes: 1. TAKE Colchicine 0.6 mg - take 1 tablet daily for 7 days  Your physician recommends that you return for lab work at your earliest [place].  Dr Sallyanne Kuster recommends that you schedule a follow-up appointment in 3 months.  If you need a refill on your cardiac medications before your next appointment, please call your pharmacy.     Signed, David Klein, MD  12/28/2017 7:02 PM    [address], [place], [place]  [address] Phone: [phone]; Fax: [phone]

## 2017-12-27 NOTE — Patient Instructions (Addendum)
Dr Sallyanne Kuster has recommended making the following medication changes: 1. TAKE Colchicine 0.6 mg - take 1 tablet daily for 7 days  Your physician recommends that you return for lab work at your earliest [place].  Dr Sallyanne Kuster recommends that you schedule a follow-up appointment in 3 months.  If you need a refill on your cardiac medications before your next appointment, please call your pharmacy.

## 2017-12-29 NOTE — Progress Notes (Signed)
Excellent. Thank you for clearing that up, Kees! MCr

## 2017-12-31 ENCOUNTER — Encounter: Payer: Self-pay | Admitting: Infectious Disease

## 2017-12-31 ENCOUNTER — Ambulatory Visit: Payer: Medicare HMO | Admitting: Infectious Disease

## 2017-12-31 VITALS — BP 134/78 | HR 66 | Temp 97.9°F | Ht 74.0 in | Wt 208.0 lb

## 2017-12-31 DIAGNOSIS — B20 Human immunodeficiency virus [HIV] disease: Secondary | ICD-10-CM

## 2017-12-31 DIAGNOSIS — N184 Chronic kidney disease, stage 4 (severe): Secondary | ICD-10-CM

## 2017-12-31 DIAGNOSIS — N179 Acute kidney failure, unspecified: Secondary | ICD-10-CM

## 2017-12-31 DIAGNOSIS — E785 Hyperlipidemia, unspecified: Secondary | ICD-10-CM | POA: Diagnosis not present

## 2017-12-31 DIAGNOSIS — J189 Pneumonia, unspecified organism: Secondary | ICD-10-CM

## 2017-12-31 DIAGNOSIS — I5042 Chronic combined systolic (congestive) and diastolic (congestive) heart failure: Secondary | ICD-10-CM

## 2017-12-31 DIAGNOSIS — J181 Lobar pneumonia, unspecified organism: Secondary | ICD-10-CM | POA: Diagnosis not present

## 2017-12-31 DIAGNOSIS — Z113 Encounter for screening for infections with a predominantly sexual mode of transmission: Secondary | ICD-10-CM

## 2017-12-31 DIAGNOSIS — M109 Gout, unspecified: Secondary | ICD-10-CM | POA: Diagnosis not present

## 2017-12-31 LAB — LIPID PANEL
Chol/HDL Ratio: 4.2 ratio (ref 0.0–5.0)
Cholesterol, Total: 197 mg/dL (ref 100–199)
HDL: 47 mg/dL (ref >39)
LDL Calculated: 130 mg/dL — ABNORMAL HIGH (ref 0–99)
Triglycerides: 99 mg/dL (ref 0–149)
VLDL Cholesterol Cal: 20 mg/dL (ref 5–40)

## 2017-12-31 LAB — URIC ACID: URIC ACID: 11.2 mg/dL — AB (ref 3.7–8.6)

## 2017-12-31 NOTE — Progress Notes (Signed)
Chief complaint: followup for HIV disease, CKD,   Subjective:    Patient ID: David [name], male    DOB: 07-17-1941, 77 y.o.   MRN: [id number]  HPI  77 year old diagnosed with  HIV + man with AIDS in June 2015, prior syphilis and now acute on chronic kidney disease,  in whom we consolidated to [facility] and who has maintained nice virological suppression and CD4 that has risen above 300.  Due to his comorbid cardiomyopathy and chronic kidney disease we had some concern about ABACAVIR and gentle risk for cardiovascular disease and therefore we changed him to [place]. He has maintained perfect control on [name] but is complaining of nausea and also lightheadedness. There could because some confounding here given the fact that he was also on terazosin  BPH but he still is having nausea and lightheadedness despite having this medication held. He wanted to be changed back off of the [place] and we changed  him to [facility] and [place].  He has tolerated these just fine but preferred being on one pill.   His viral load is  Well controlled. He had a "viral blip" of 28 but this is NOT CLINICALLY SIGNIFICANT and could either constitute a lab error, or virus coming out of "reservoir". He is  Maintaining excellent adherence to his regimen.   Lab Results  Component Value Date   HIV1RNAQUANT 28 (H) 12/17/2017   HIV1RNAQUANT <20 11/22/2017   HIV1RNAQUANT <20 NOT DETECTED 09/19/2017      Lab Results  Component Value Date   CD4TABS 300 (L) 12/17/2017   CD4TABS 170 (L) 11/22/2017   CD4TABS 310 (L) 09/19/2017   He is being followed closely by Dr. Orene Desanctis with Cardiology and with Dr. Posey Pronto from Nephrology. He was referred to [facility] for transplant but he may end up being referred to [facility] because no one has reached out to him. 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 He was also admitted this December with CAP with RML and LLL infiltrates  and seen by Dr. Johnnye Sima and Dr. [name] Salon. He  is feeling much better.  Past Medical History:  Diagnosis Date  . Anemia, iron deficiency 11/15/2011  . Arthritis   . Cancer [facility])    hx of prostate; s/p radioactive seed implant 10/2009 Dr Janice Norrie  . Chronic kidney disease   . Glaucoma   . Heart murmur    "mild" per pt  . History of cardiac cath 2004   negative for CAD  . History of myocardial perfusion scan 02/2010   negative for coronary insufficiency (LVEF 27%)  . HIV infection ([place])   . Hyperlipidemia   . Hypertension    followed by [facility] and Vascular (Dr Dani Gobble [place])  . Nonischemic cardiomyopathy ([place])   . Sinus bradycardia   . Stroke [facility])    "mini stroke" years ago  . Systolic and diastolic CHF, acute ([place]) 06/2010   felt to be secondary to hypertensive cardiomyopathy    Past Surgical History:  Procedure Laterality Date  . AV FISTULA PLACEMENT Right 10/13/2016   Procedure: ARTERIOVENOUS (AV) FISTULA CREATION;  Surgeon: [name], MD;  Location: [place];  Service: Vascular;  Laterality: Right;  . BASCILIC VEIN TRANSPOSITION Left 06/24/2015   Procedure: BASILIC VEIN TRANSPOSITION;  Surgeon: Mal Misty, MD;  Location: [place];  Service: Vascular;  Laterality: Left;  . CARDIAC CATHETERIZATION  02/04/2003   mildly depressed LV systolic fx EF 69%,CVELFY coronaries/abdominal aorta/renal arteries.  Marland Kitchen  COLONOSCOPY    . ESOPHAGOGASTRODUODENOSCOPY (EGD) WITH PROPOFOL N/A 05/05/2014   Procedure: ESOPHAGOGASTRODUODENOSCOPY (EGD) WITH PROPOFOL;  Surgeon: Missy Sabins, MD;  [facility];  Service: Endoscopy;  Laterality: N/A;  . EYE SURGERY Bilateral    cataract surgery   . EYE SURGERY Bilateral    glaucoma surgery  . NM MYOCAR PERF WALL MOTION  02/21/2010   normal  . RADIOACTIVE SEED IMPLANT  2010   prostate cancer  . US ECHOCARDIOGRAPHY  02/06/2012   mild LVH,LA mod. dilated,mild-mod. 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 AI.    Family History  Problem Relation Age of Onset  .  Hypertension Mother   . Thyroid disease Mother   . Cholelithiasis Daughter   . Cholelithiasis Son   . Hypertension Maternal Grandmother   . Diabetes Maternal Grandmother   . Heart attack Neg Hx   . Hyperlipidemia Neg Hx       Social History   Socioeconomic History  . Marital status: Married    Spouse name: None  . Number of children: 2  . Years of education: 77  . Highest education level: None  Social Needs  . Financial resource strain: None  . Food insecurity - worry: None  . Food insecurity - inability: None  . Transportation needs - medical: None  . Transportation needs - non-medical: None  Occupational History  . 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 Smoking status: Former Smoker    Last attempt to quit: 12/04/2005    Years since quitting: 12.0  . Smokeless tobacco: Never Used  Substance and Sexual Activity  . Alcohol use: Yes    Comment: once a week-wine  . Drug use: No  . Sexual activity: None  Other Topics Concern  . None  Social History Narrative   Stays active at home   Regular exercise: no   Drinks 2 cups of coffee a week, 1 mountain dew soda a day.    Allergies  Allergen Reactions  . Codeine Other (See Comments)    Causes constipation  . Cozaar [Losartan Potassium] Other (See Comments)    Causes constipation      Current Outpatient Medications:  .  acetaminophen (TYLENOL) 500 MG tablet, Take 500 mg by mouth every 4 (four) hours as needed (for muscle pain). , Disp: , Rfl:  .  amLODipine (NORVASC) 10 MG tablet, TAKE 1 TABLET EVERY DAY (Patient taking differently: Take 10 mg by mouth once a day), Disp: 90 tablet, Rfl: 3 .  aspirin 81 MG chewable tablet, Place 1 tablet (81 mg total) into feeding tube daily., Disp: , Rfl:  .  brimonidine (ALPHAGAN) 0.15 % ophthalmic solution, Place 1 drop into the left eye 2 (two) times daily. , Disp: , Rfl: 1 .  carvedilol (COREG) 25 MG tablet, TAKE 1 TABLET TWICE DAILY  (Patient taking differently: Take 25 mg by mouth two times a day), Disp: 180 tablet, Rfl: 3 .  colchicine 0.6 MG tablet, Take 1 tablet (0.6 mg total) by mouth daily for 7 days., Disp: 30 tablet, Rfl: 0 .  dolutegravir (TIVICAY) 50 MG tablet, Take 1 tablet (50 mg total) by mouth daily., Disp: 30 tablet, Rfl: 11 .  dorzolamide-timolol (COSOPT) 22.3-6.8 MG/ML ophthalmic solution, Place 1 drop into the left eye 2 (two) times daily. , Disp: , Rfl:  .  emtricitabine-tenofovir AF (DESCOVY) 200-25 MG tablet, Take 1 tablet by mouth daily., Disp: 30 tablet,  Rfl: 11 .  furosemide (LASIX) 40 MG tablet, Take 2 tablets (80 mg total) by mouth daily. (Patient taking differently: Take 80 mg by mouth every morning. ), Disp: 180 tablet, Rfl: 3 .  latanoprost (XALATAN) 0.005 % ophthalmic solution, Place 1 drop into the left eye at bedtime. , Disp: , Rfl:  .  Vitamin D, Ergocalciferol, (DRISDOL) 50000 UNITS CAPS capsule, Take 50,000 units by mouth once a week on Saturdays, Disp: , Rfl: 2   Review of Systems  Constitutional: Negative for activity change, appetite change, chills, fatigue, fever and unexpected weight change.  HENT: Negative for congestion, rhinorrhea, sinus pressure, sneezing, sore throat and trouble swallowing.   Respiratory: Negative for cough and shortness of breath.   Cardiovascular: Negative for chest pain, palpitations and leg swelling.  Gastrointestinal: Negative for abdominal distention, abdominal pain, constipation, nausea and vomiting.  Genitourinary: Negative for hematuria.  Musculoskeletal: Negative for myalgias.  Skin: Negative for color change, pallor, rash and wound.  Neurological: Negative for dizziness, tremors, weakness and light-headedness.  Hematological: Negative for adenopathy. Does not bruise/bleed easily.  Psychiatric/Behavioral: Negative for agitation, behavioral problems, confusion, decreased concentration and sleep disturbance.       Objective:   Physical Exam    Constitutional: He is oriented to person, place, and time. He appears well-developed and well-nourished. No distress.  HENT:  Head: Normocephalic and atraumatic.  Mouth/Throat: Oropharynx is clear and moist. No oropharyngeal exudate.  Eyes: Conjunctivae and EOM are normal. No scleral icterus.  Neck: Normal range of motion. Neck supple.  Cardiovascular: Normal rate, regular rhythm and normal heart sounds. Exam reveals no gallop and no friction rub.  No murmur heard. Pulmonary/Chest: Effort normal and breath sounds normal. No respiratory distress.  Abdominal: Soft. Bowel sounds are normal. He exhibits no distension. There is no tenderness. There is no rebound.  Musculoskeletal: He exhibits no edema or tenderness.  Neurological: He is alert and oriented to person, place, and time. He has normal reflexes. He exhibits normal muscle tone. Coordination normal.  Skin: Skin is warm and dry. He is not diaphoretic. No erythema. No pallor.  Psychiatric: He has a normal mood and affect. His behavior is normal. Judgment and thought content normal.          Assessment & Plan:   HIV/AIDS:Due to problems with nausea that he attributes to [facility] we  changed to  [facility] and DESCOVY and will continue this. He will renew SPAP today.   Syphilis:titers down  CKD: followed by Dr Posey Pronto and likely need HD in future and or transplant  HTN: followup with PCP, better  controlled   Vitals:   12/31/17 1020  BP: 134/78  Pulse: 66  Temp: 97.9 F (36.6 C)     NICM: Cardiology following closely  CAP: resolved  I spent greater than 25 minutes with the patient including greater than 50% of time in face to face counsel of the patient with counsel of him re his adherence, meaning of his labs, nature of viral :"blips"  and in coordination of his care with Dr. Orene Desanctis.

## 2018-01-01 ENCOUNTER — Ambulatory Visit: Payer: Medicare HMO

## 2018-01-03 ENCOUNTER — Telehealth: Payer: Self-pay | Admitting: *Deleted

## 2018-01-03 NOTE — Telephone Encounter (Signed)
Received Home Health Certification and Plan of Care; forwarded to provider/SLS 01/31    

## 2018-01-04 ENCOUNTER — Telehealth: Payer: Self-pay

## 2018-01-04 DIAGNOSIS — M10372 Gout due to renal impairment, left ankle and foot: Secondary | ICD-10-CM

## 2018-01-04 MED ORDER — ALLOPURINOL 100 MG PO TABS: 100.0000 mg | ORAL_TABLET | Freq: Every day | ORAL | 3 refills | Status: DC

## 2018-01-04 NOTE — Telephone Encounter (Signed)
-----   Message from [name], MD sent at 12/31/2017  5:18 PM EST ----- Labs show elevated uric acid, making it even more likely that his foot pain is from gout. Is he better since we started colchicine? I would recommend that he starts allopurinol 100 mg daily and recheck uric acid level in several weeks. I checked and see no adverse interactions with his other meds.

## 2018-01-04 NOTE — Telephone Encounter (Signed)
Called patient with results. Patient verbalized understanding and agreed with plan. Rx(s) sent to pharmacy electronically. Repeat lab ordered and will be mailed to patient.

## 2018-01-09 LAB — ACID FAST CULTURE WITH REFLEXED SENSITIVITIES (MYCOBACTERIA)

## 2018-01-09 LAB — ACID FAST CULTURE WITH REFLEXED SENSITIVITIES: ACID FAST CULTURE - AFSCU3: NEGATIVE

## 2018-01-09 NOTE — Telephone Encounter (Signed)
Plan of Care signed and faxed to [facility] at [phone]. Form sent for scanning.

## 2018-01-21 ENCOUNTER — Inpatient Hospital Stay (HOSPITAL_COMMUNITY): Payer: Medicare HMO

## 2018-01-21 ENCOUNTER — Encounter (HOSPITAL_COMMUNITY): Payer: Self-pay | Admitting: Emergency Medicine

## 2018-01-21 ENCOUNTER — Other Ambulatory Visit: Payer: Self-pay

## 2018-01-21 ENCOUNTER — Emergency Department (HOSPITAL_COMMUNITY): Payer: Medicare HMO

## 2018-01-21 ENCOUNTER — Observation Stay (HOSPITAL_COMMUNITY)
Admission: EM | Admit: 2018-01-21 | Discharge: 2018-01-22 | Disposition: A | Discharge status: Home / Self Care | Payer: Medicare HMO | Attending: Internal Medicine | Admitting: Internal Medicine

## 2018-01-21 DIAGNOSIS — Z7982 Long term (current) use of aspirin: Secondary | ICD-10-CM | POA: Insufficient documentation

## 2018-01-21 DIAGNOSIS — N184 Chronic kidney disease, stage 4 (severe): Secondary | ICD-10-CM | POA: Diagnosis not present

## 2018-01-21 DIAGNOSIS — R0602 Shortness of breath: Secondary | ICD-10-CM | POA: Diagnosis not present

## 2018-01-21 DIAGNOSIS — I132 Hypertensive heart and chronic kidney disease with heart failure and with stage 5 chronic kidney disease, or end stage renal disease: Secondary | ICD-10-CM | POA: Diagnosis not present

## 2018-01-21 DIAGNOSIS — Z8673 Personal history of transient ischemic attack (TIA), and cerebral infarction without residual deficits: Secondary | ICD-10-CM | POA: Diagnosis not present

## 2018-01-21 DIAGNOSIS — H409 Unspecified glaucoma: Secondary | ICD-10-CM | POA: Insufficient documentation

## 2018-01-21 DIAGNOSIS — Z8701 Personal history of pneumonia (recurrent): Secondary | ICD-10-CM | POA: Insufficient documentation

## 2018-01-21 DIAGNOSIS — D509 Iron deficiency anemia, unspecified: Secondary | ICD-10-CM | POA: Insufficient documentation

## 2018-01-21 DIAGNOSIS — I130 Hypertensive heart and chronic kidney disease with heart failure and stage 1 through stage 4 chronic kidney disease, or unspecified chronic kidney disease: Secondary | ICD-10-CM | POA: Diagnosis not present

## 2018-01-21 DIAGNOSIS — J9601 Acute respiratory failure with hypoxia: Principal | ICD-10-CM | POA: Diagnosis present

## 2018-01-21 DIAGNOSIS — E872 Acidosis: Secondary | ICD-10-CM | POA: Insufficient documentation

## 2018-01-21 DIAGNOSIS — B20 Human immunodeficiency virus [HIV] disease: Secondary | ICD-10-CM | POA: Diagnosis not present

## 2018-01-21 DIAGNOSIS — R0902 Hypoxemia: Secondary | ICD-10-CM

## 2018-01-21 DIAGNOSIS — I420 Dilated cardiomyopathy: Secondary | ICD-10-CM | POA: Insufficient documentation

## 2018-01-21 DIAGNOSIS — Z8546 Personal history of malignant neoplasm of prostate: Secondary | ICD-10-CM | POA: Insufficient documentation

## 2018-01-21 DIAGNOSIS — I5043 Acute on chronic combined systolic (congestive) and diastolic (congestive) heart failure: Secondary | ICD-10-CM

## 2018-01-21 DIAGNOSIS — Z79899 Other long term (current) drug therapy: Secondary | ICD-10-CM | POA: Insufficient documentation

## 2018-01-21 DIAGNOSIS — N185 Chronic kidney disease, stage 5: Secondary | ICD-10-CM | POA: Diagnosis not present

## 2018-01-21 DIAGNOSIS — D539 Nutritional anemia, unspecified: Secondary | ICD-10-CM | POA: Diagnosis not present

## 2018-01-21 DIAGNOSIS — I5023 Acute on chronic systolic (congestive) heart failure: Secondary | ICD-10-CM | POA: Diagnosis not present

## 2018-01-21 LAB — CBG MONITORING, ED
GLUCOSE-CAPILLARY: 92 mg/dL (ref 65–99)
GLUCOSE-CAPILLARY: 93 mg/dL (ref 65–99)
GLUCOSE-CAPILLARY: 83 mg/dL (ref 65–99)

## 2018-01-21 LAB — CBC WITH DIFFERENTIAL/PLATELET
BASOS ABS: 0.1 10*3/uL (ref 0.0–0.1)
Basophils Relative: 0 %
Eosinophils Absolute: 0.7 10*3/uL (ref 0.0–0.7)
Eosinophils Relative: 5 %
HEMATOCRIT: 38.5 % — AB (ref 39.0–52.0)
Hemoglobin: 12.1 g/dL — ABNORMAL LOW (ref 13.0–17.0)
LYMPHS ABS: 4.8 10*3/uL — AB (ref 0.7–4.0)
LYMPHS PCT: 36 %
MCH: 32 pg (ref 26.0–34.0)
MCHC: 31.4 g/dL (ref 30.0–36.0)
MCV: 101.9 fL — AB (ref 78.0–100.0)
MONO ABS: 0.6 10*3/uL (ref 0.1–1.0)
Monocytes Relative: 5 %
NEUTROS ABS: 7.3 10*3/uL (ref 1.7–7.7)
Neutrophils Relative %: 54 %
Platelets: 313 10*3/uL (ref 150–400)
RBC: 3.78 MIL/uL — ABNORMAL LOW (ref 4.22–5.81)
RDW: 14.2 % (ref 11.5–15.5)
WBC: 13.4 10*3/uL — ABNORMAL HIGH (ref 4.0–10.5)

## 2018-01-21 LAB — I-STAT ARTERIAL BLOOD GAS, ED
ACID-BASE DEFICIT: 7 mmol/L — AB (ref 0.0–2.0)
Acid-base deficit: 4 mmol/L — ABNORMAL HIGH (ref 0.0–2.0)
BICARBONATE: 19.3 mmol/L — AB (ref 20.0–28.0)
Bicarbonate: 21 mmol/L (ref 20.0–28.0)
O2 Saturation: 96 %
O2 Saturation: 97 %
PCO2 ART: 35.8 mmHg (ref 32.0–48.0)
PH ART: 7.293 — AB (ref 7.350–7.450)
PH ART: 7.376 (ref 7.350–7.450)
TCO2: 21 mmol/L — ABNORMAL LOW (ref 22–32)
TCO2: 22 mmol/L (ref 22–32)
pCO2 arterial: 39.5 mmHg (ref 32.0–48.0)
pO2, Arterial: 86 mmHg (ref 83.0–108.0)
pO2, Arterial: 96 mmHg (ref 83.0–108.0)

## 2018-01-21 LAB — COMPREHENSIVE METABOLIC PANEL
ALT: 25 U/L (ref 17–63)
AST: 25 U/L (ref 15–41)
Albumin: 3.8 g/dL (ref 3.5–5.0)
Alkaline Phosphatase: 113 U/L (ref 38–126)
Anion gap: 15 (ref 5–15)
BILIRUBIN TOTAL: 0.7 mg/dL (ref 0.3–1.2)
BUN: 66 mg/dL — AB (ref 6–20)
CO2: 19 mmol/L — ABNORMAL LOW (ref 22–32)
CREATININE: 4.53 mg/dL — AB (ref 0.61–1.24)
Calcium: 8.4 mg/dL — ABNORMAL LOW (ref 8.9–10.3)
Chloride: 106 mmol/L (ref 101–111)
GFR calc Af Amer: 13 mL/min — ABNORMAL LOW (ref >60)
GFR, EST NON AFRICAN AMERICAN: 12 mL/min — AB (ref >60)
Glucose, Bld: 241 mg/dL — ABNORMAL HIGH (ref 65–99)
Potassium: 3.5 mmol/L (ref 3.5–5.1)
Sodium: 140 mmol/L (ref 135–145)
Total Protein: 7.4 g/dL (ref 6.5–8.1)

## 2018-01-21 LAB — CBC
HCT: 37 % — ABNORMAL LOW (ref 39.0–52.0)
Hemoglobin: 11.8 g/dL — ABNORMAL LOW (ref 13.0–17.0)
MCH: 32.3 pg (ref 26.0–34.0)
MCHC: 31.9 g/dL (ref 30.0–36.0)
MCV: 101.4 fL — ABNORMAL HIGH (ref 78.0–100.0)
PLATELETS: 258 10*3/uL (ref 150–400)
RBC: 3.65 MIL/uL — ABNORMAL LOW (ref 4.22–5.81)
RDW: 14.1 % (ref 11.5–15.5)
WBC: 10.6 10*3/uL — ABNORMAL HIGH (ref 4.0–10.5)

## 2018-01-21 LAB — I-STAT TROPONIN, ED: TROPONIN I, POC: 0.05 ng/mL (ref 0.00–0.08)

## 2018-01-21 LAB — TROPONIN I
TROPONIN I: 0.06 ng/mL — AB (ref <0.03)
Troponin I: 0.06 ng/mL (ref <0.03)
Troponin I: 0.06 ng/mL (ref <0.03)

## 2018-01-21 LAB — BRAIN NATRIURETIC PEPTIDE: B NATRIURETIC PEPTIDE 5: 975.9 pg/mL — AB (ref 0.0–100.0)

## 2018-01-21 LAB — GLUCOSE, CAPILLARY: GLUCOSE-CAPILLARY: 117 mg/dL — AB (ref 65–99)

## 2018-01-21 LAB — CREATININE, SERUM
Creatinine, Ser: 4.48 mg/dL — ABNORMAL HIGH (ref 0.61–1.24)
GFR calc Af Amer: 14 mL/min — ABNORMAL LOW (ref >60)
GFR, EST NON AFRICAN AMERICAN: 12 mL/min — AB (ref >60)

## 2018-01-21 LAB — I-STAT CG4 LACTIC ACID, ED: LACTIC ACID, VENOUS: 2 mmol/L — AB (ref 0.5–1.9)

## 2018-01-21 LAB — HEMOGLOBIN A1C
HEMOGLOBIN A1C: 6.1 % — AB (ref 4.8–5.6)
MEAN PLASMA GLUCOSE: 128.37 mg/dL

## 2018-01-21 MED ORDER — SODIUM CHLORIDE 0.9 % IV SOLN: 250.0000 mL | INTRAVENOUS | Status: DC | PRN

## 2018-01-21 MED ORDER — DOLUTEGRAVIR SODIUM 50 MG PO TABS
50.0000 mg | ORAL_TABLET | Freq: Every day | ORAL | Status: DC
  Administered 2018-01-21 – 2018-01-22 (×2): 50 mg via ORAL
  Filled 2018-01-21 (×3): qty 1

## 2018-01-21 MED ORDER — FUROSEMIDE 80 MG PO TABS
160.0000 mg | ORAL_TABLET | Freq: Three times a day (TID) | ORAL | Status: DC
  Administered 2018-01-21 – 2018-01-22 (×3): 160 mg via ORAL
  Filled 2018-01-21 (×3): qty 2

## 2018-01-21 MED ORDER — SODIUM CHLORIDE 0.9% FLUSH
3.0000 mL | Freq: Two times a day (BID) | INTRAVENOUS | Status: DC
  Administered 2018-01-22: 3 mL via INTRAVENOUS

## 2018-01-21 MED ORDER — BRIMONIDINE TARTRATE 0.15 % OP SOLN
1.0000 [drp] | Freq: Two times a day (BID) | OPHTHALMIC | Status: DC
  Administered 2018-01-21 – 2018-01-22 (×3): 1 [drp] via OPHTHALMIC
  Filled 2018-01-21 (×2): qty 5

## 2018-01-21 MED ORDER — CARVEDILOL 25 MG PO TABS
25.0000 mg | ORAL_TABLET | Freq: Two times a day (BID) | ORAL | Status: DC
  Administered 2018-01-21 – 2018-01-22 (×3): 25 mg via ORAL
  Filled 2018-01-21: qty 1
  Filled 2018-01-21: qty 2
  Filled 2018-01-21: qty 1
  Filled 2018-01-21: qty 2

## 2018-01-21 MED ORDER — ACETAMINOPHEN 325 MG PO TABS: 650.0000 mg | ORAL_TABLET | ORAL | Status: DC | PRN

## 2018-01-21 MED ORDER — DORZOLAMIDE HCL-TIMOLOL MAL 2-0.5 % OP SOLN
1.0000 [drp] | Freq: Two times a day (BID) | OPHTHALMIC | Status: DC
  Administered 2018-01-21 – 2018-01-22 (×3): 1 [drp] via OPHTHALMIC
  Filled 2018-01-21 (×3): qty 10

## 2018-01-21 MED ORDER — ALLOPURINOL 100 MG PO TABS
100.0000 mg | ORAL_TABLET | Freq: Every day | ORAL | Status: DC
  Administered 2018-01-21 – 2018-01-22 (×2): 100 mg via ORAL
  Filled 2018-01-21 (×3): qty 1

## 2018-01-21 MED ORDER — ASPIRIN 81 MG PO CHEW: 81.0000 mg | CHEWABLE_TABLET | Freq: Every day | ORAL | Status: DC

## 2018-01-21 MED ORDER — EMTRICITABINE-TENOFOVIR AF 200-25 MG PO TABS
1.0000 | ORAL_TABLET | Freq: Every day | ORAL | Status: DC
  Administered 2018-01-21 – 2018-01-22 (×2): 1 via ORAL
  Filled 2018-01-21 (×3): qty 1

## 2018-01-21 MED ORDER — FUROSEMIDE 10 MG/ML IJ SOLN
200.0000 mg | Freq: Four times a day (QID) | INTRAVENOUS | Status: DC
  Administered 2018-01-21: 200 mg via INTRAVENOUS
  Filled 2018-01-21 (×3): qty 20

## 2018-01-21 MED ORDER — INSULIN ASPART 100 UNIT/ML ~~LOC~~ SOLN
0.0000 [IU] | SUBCUTANEOUS | Status: DC
  Administered 2018-01-22: 1 [IU] via SUBCUTANEOUS

## 2018-01-21 MED ORDER — SODIUM CHLORIDE 0.9% FLUSH
3.0000 mL | INTRAVENOUS | Status: DC | PRN
  Administered 2018-01-21: 3 mL via INTRAVENOUS

## 2018-01-21 MED ORDER — AMLODIPINE BESYLATE 10 MG PO TABS
10.0000 mg | ORAL_TABLET | Freq: Every day | ORAL | Status: DC
Start: 2018-01-21 — End: 2018-01-22
  Administered 2018-01-21 – 2018-01-22 (×2): 10 mg via ORAL
  Filled 2018-01-21: qty 1
  Filled 2018-01-21: qty 2

## 2018-01-21 MED ORDER — ONDANSETRON HCL 4 MG/2ML IJ SOLN: 4.0000 mg | Freq: Four times a day (QID) | INTRAMUSCULAR | Status: DC | PRN

## 2018-01-21 MED ORDER — ALPRAZOLAM 0.25 MG PO TABS
0.2500 mg | ORAL_TABLET | Freq: Two times a day (BID) | ORAL | Status: DC | PRN
Start: 2018-01-21 — End: 2018-01-22

## 2018-01-21 MED ORDER — HEPARIN SODIUM (PORCINE) 5000 UNIT/ML IJ SOLN
5000.0000 [IU] | Freq: Three times a day (TID) | INTRAMUSCULAR | Status: DC
  Administered 2018-01-21 – 2018-01-22 (×4): 5000 [IU] via SUBCUTANEOUS
  Filled 2018-01-21 (×4): qty 1

## 2018-01-21 MED ORDER — FUROSEMIDE 10 MG/ML IJ SOLN
120.0000 mg | Freq: Once | INTRAVENOUS | Status: AC
  Administered 2018-01-21: 120 mg via INTRAVENOUS
  Filled 2018-01-21: qty 12

## 2018-01-21 MED ORDER — ASPIRIN 81 MG PO CHEW
81.0000 mg | CHEWABLE_TABLET | Freq: Every day | ORAL | Status: DC
  Administered 2018-01-21 – 2018-01-22 (×2): 81 mg via ORAL
  Filled 2018-01-21 (×2): qty 1

## 2018-01-21 MED ORDER — FUROSEMIDE 10 MG/ML IJ SOLN
80.0000 mg | Freq: Once | INTRAMUSCULAR | Status: AC
  Administered 2018-01-21: 80 mg via INTRAVENOUS
  Filled 2018-01-21: qty 8

## 2018-01-21 MED ORDER — LATANOPROST 0.005 % OP SOLN
1.0000 [drp] | Freq: Every day | OPHTHALMIC | Status: DC
  Administered 2018-01-22: 1 [drp] via OPHTHALMIC
  Filled 2018-01-21 (×2): qty 2.5

## 2018-01-21 NOTE — Progress Notes (Signed)
Placed on bipap per MD.

## 2018-01-21 NOTE — ED Notes (Signed)
David Piety, MD notified re: elevated troponin

## 2018-01-21 NOTE — ED Notes (Signed)
Admitting paged re: pts ABG results, pt to be taken off of Bipap

## 2018-01-21 NOTE — ED Notes (Signed)
Iv team here  The pt told the iv nurse that he had 2  Dialysis fistulas in both arms  However the iv team nurse cannot feel fistulas no ivs attempted

## 2018-01-21 NOTE — ED Notes (Signed)
Admitting paged to RN per her request 

## 2018-01-21 NOTE — ED Notes (Signed)
Placed pt on bedpan, tolerated well. 

## 2018-01-21 NOTE — Progress Notes (Addendum)
77 YO male CKD,HIV admitted early today with pulmonary edema,hypoxia and acidosis.improved with BIPAP,lasixhe has fistula in both arms not working.nephrology consult pending.ABG improved.he is awake alert oriented with stable vitals at this time.

## 2018-01-21 NOTE — ED Notes (Signed)
Pt eating meal and tolerating well.

## 2018-01-21 NOTE — H&P (Signed)
History and Physical    David [name] VQM:[id number] DOB: 1941-09-10 DOA: 01/21/2018  PCP: [name], NP   Patient coming from: Home  Chief Complaint: SOB  HPI: David [name] is a 77 y.o. 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  Patient reports that he had been in his usual state of health throughout most of the day, but developed some dyspnea at approximately 10 PM that rapidly worsened.  He denies any associated chest pain, fevers, chills, or cough.  Denies lower extremity swelling or tenderness.  Denies dietary indiscretions.  Reports continued adherence with his medications.  ED Course: Upon arrival to the ED, patient is found to be afebrile, saturating adequately on BiPAP, tachypneic in the 30s, and with normal heart rate and blood pressure.  EKG features a sinus rhythm with PVC and LBBB, similar to priors.  Chest x-ray is notable for increased cardiomegaly with moderate to severe pulmonary edema.  Chemistry panel reveals a serum creatinine of 4.53, consistent with his apparent baseline, as well as a glucose of 241.  CBC is notable for a stable microcytic anemia with hemoglobin of 12.1.  Troponin is within the normal limits and BNP is elevated.  Patient was given 80 mg of IV Lasix and started on BiPAP in the ED.  There was no urine output and after a couple hours, nephrology was consulted by the ED physician.  Nephrology recommends Lasix 20 mg IV every 6 hours and will consider dialysis if no response in about 12 hours.  Patient has had some improvement in work of breathing on BiPAP, remains hemodynamically stable, and will be admitted to the stepdown unit for ongoing evaluation and management of acute on chronic combined  CHF.   Review of Systems:  All other systems reviewed and apart from HPI, are negative.  Past Medical History:  Diagnosis Date  . Anemia, iron deficiency 11/15/2011  . Arthritis   . Cancer [facility])    hx of prostate; s/p radioactive seed implant 10/2009 Dr Janice Norrie  . Chronic kidney disease   . Glaucoma   . Heart murmur    "mild" per pt  . History of cardiac cath 2004   negative for CAD  . History of myocardial perfusion scan 02/2010   negative for coronary insufficiency (LVEF 27%)  . HIV infection ([place])   . Hyperlipidemia   . Hypertension    followed by [facility] (Dr Dani Gobble Croitoru)  . Nonischemic cardiomyopathy ([place])   . Sinus bradycardia   . Stroke [facility])    "mini stroke" years ago  . Systolic and diastolic CHF, acute ([place]) 06/2010   felt to be secondary to hypertensive cardiomyopathy    Past Surgical History:  Procedure Laterality Date  . AV FISTULA PLACEMENT Right 10/13/2016   Procedure: ARTERIOVENOUS (AV) FISTULA CREATION;  Surgeon: [name], MD;  Location: [place];  Service: Vascular;  Laterality: Right;  . BASCILIC VEIN TRANSPOSITION Left 06/24/2015   Procedure: BASILIC VEIN TRANSPOSITION;  Surgeon: Mal Misty, MD;  Location: [place];  Service: Vascular;  Laterality: Left;  . CARDIAC CATHETERIZATION  02/04/2003   mildly depressed LV systolic fx EF 71%,IWPYKD coronaries/abdominal aorta/renal arteries.  . COLONOSCOPY    . ESOPHAGOGASTRODUODENOSCOPY (EGD) WITH PROPOFOL N/A 05/05/2014   Procedure: ESOPHAGOGASTRODUODENOSCOPY (EGD) WITH PROPOFOL;  Surgeon: Missy Sabins, MD;  Location: [facility];  Service: Endoscopy;  Laterality: N/A;  . EYE SURGERY Bilateral    cataract surgery   . EYE SURGERY Bilateral    glaucoma surgery  . NM MYOCAR PERF WALL MOTION  02/21/2010   normal  . RADIOACTIVE SEED IMPLANT  2010   prostate cancer  . US ECHOCARDIOGRAPHY  02/06/2012   mild LVH,LA mod. dilated,mild-mod. 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 AI.     reports that he quit smoking about 12 years ago. he has never used smokeless tobacco. He reports that he drinks alcohol. He reports that he does not use drugs.  Allergies  Allergen Reactions  . Codeine Other (See Comments)    Causes constipation  . Cozaar [Losartan Potassium] Other (See Comments)    Causes constipation     Family History  Problem Relation Age of Onset  . Hypertension Mother   . Thyroid disease Mother   . Cholelithiasis Daughter   . Cholelithiasis Son   . Hypertension Maternal Grandmother   . Diabetes Maternal Grandmother   . Heart attack Neg Hx   . Hyperlipidemia Neg Hx      Prior to Admission medications   Medication Sig Start Date End Date Taking? Authorizing Provider  acetaminophen (TYLENOL) 500 MG tablet Take 500 mg by mouth every 4 (four) hours as needed (for muscle pain).     [provider]  allopurinol (ZYLOPRIM) 100 MG tablet Take 1 tablet (100 mg total) by mouth daily. 01/04/18   Croitoru, Mihai, MD  amLODipine (NORVASC) 10 MG tablet TAKE 1 TABLET EVERY DAY Patient taking differently: Take 10 mg by mouth once a day 01/09/17   Croitoru, Mihai, MD  aspirin 81 MG chewable tablet Place 1 tablet (81 mg total) into feeding tube daily. 12/02/17   Mikhail, Velta Addison, DO  brimonidine (ALPHAGAN) 0.15 % ophthalmic solution Place 1 drop into the left eye 2 (two) times daily.  11/18/15   [provider]  carvedilol (COREG) 25 MG tablet TAKE 1 TABLET TWICE DAILY Patient taking differently: Take 25 mg by mouth two times a day 01/09/17   Croitoru, Mihai, MD  colchicine 0.6 MG tablet Take 1 tablet (0.6 mg total) by mouth daily for 7 days. 12/27/17 01/03/18  Croitoru, Mihai, MD  dolutegravir (TIVICAY) 50 MG tablet Take 1 tablet (50 mg total) by mouth daily. 08/22/17   Truman Hayward, MD  dorzolamide-timolol (COSOPT) 22.3-6.8 MG/ML ophthalmic solution Place 1 drop into the left eye 2 (two) times daily.     [provider]    emtricitabine-tenofovir AF (DESCOVY) 200-25 MG tablet Take 1 tablet by mouth daily. 08/22/17   Truman Hayward, MD  furosemide (LASIX) 40 MG tablet Take 2 tablets (80 mg total) by mouth daily. Patient taking differently: Take 80 mg by mouth every morning.  09/14/17 09/09/18  Croitoru, Mihai, MD  latanoprost (XALATAN) 0.005 % ophthalmic solution Place 1 drop into the left eye at bedtime.     [provider]  Vitamin D, Ergocalciferol, (DRISDOL) 50000 UNITS CAPS capsule Take 50,000 units by mouth once a week on Saturdays 04/10/15   [provider]    Physical Exam: Vitals:   01/21/18 0130 01/21/18 0135 01/21/18 0145 01/21/18 0305  BP: 137/87  127/86   Pulse:  82 83 79  Resp: (!) 25 (!) 25 (!) 21 (!) 24  Temp:      TempSrc:      SpO2:  100% 100% 100%  Weight:      Height:          Constitutional: Increased WOB, no pallor, no diaphoresis  Eyes: PERTLA, lids and conjunctivae normal ENMT: Mucous membranes are moist. Posterior pharynx clear of any exudate or lesions.   Neck: normal, supple, no masses, no thyromegaly Respiratory: Diffuse rales. Accessory muscle recruitment. No pallor or cyanosis.  Cardiovascular: S1 & S2 heard, regular rate and rhythm. No extremity edema.  Abdomen: No distension, no tenderness, no masses palpated. Bowel sounds normal.  Musculoskeletal: no clubbing / cyanosis. No joint deformity upper and lower extremities.    Skin: no significant rashes, lesions, ulcers. Warm, dry, well-perfused. Neurologic: CN 2-12 grossly intact. Sensation intact. Strength 5/5 in all 4 limbs.  Psychiatric:  Alert and oriented x 3. Calm, cooperative.     Labs on Admission: I have personally reviewed following labs and imaging studies  CBC: Recent Labs  Lab 01/21/18 0100  WBC 13.4*  NEUTROABS 7.3  HGB 12.1*  HCT 38.5*  MCV 101.9*  PLT 671   Basic Metabolic Panel: Recent Labs  Lab 01/21/18 0100  NA 140  K 3.5  CL 106  CO2 19*  GLUCOSE 241*  BUN  66*  CREATININE 4.53*  CALCIUM 8.4*   GFR: Estimated Creatinine Clearance: 16.4 mL/min (A) (by C-G formula based on SCr of 4.53 mg/dL (H)). Liver Function Tests: Recent Labs  Lab 01/21/18 0100  AST 25  ALT 25  ALKPHOS 113  BILITOT 0.7  PROT 7.4  ALBUMIN 3.8   No results for input(s): LIPASE, AMYLASE in the last 168 hours. No results for input(s): AMMONIA in the last 168 hours. Coagulation Profile: No results for input(s): INR, PROTIME in the last 168 hours. Cardiac Enzymes: No results for input(s): CKTOTAL, CKMB, CKMBINDEX, TROPONINI in the last 168 hours. BNP (last 3 results) No results for input(s): PROBNP in the last 8760 hours. HbA1C: No results for input(s): HGBA1C in the last 72 hours. CBG: No results for input(s): GLUCAP in the last 168 hours. Lipid Profile: No results for input(s): CHOL, HDL, LDLCALC, TRIG, CHOLHDL, LDLDIRECT in the last 72 hours. Thyroid Function Tests: No results for input(s): TSH, T4TOTAL, FREET4, T3FREE, THYROIDAB in the last 72 hours. Anemia Panel: No results for input(s): VITAMINB12, FOLATE, FERRITIN, TIBC, IRON, RETICCTPCT in the last 72 hours. Urine analysis:    Component Value Date/Time   COLORURINE YELLOW 11/25/2017 0009   APPEARANCEUR HAZY (A) 11/25/2017 0009   LABSPEC 1.014 11/25/2017 0009   PHURINE 5.0 11/25/2017 0009   GLUCOSEU NEGATIVE 11/25/2017 0009   HGBUR NEGATIVE 11/25/2017 0009   BILIRUBINUR NEGATIVE 11/25/2017 0009   BILIRUBINUR negative 04/16/2013 1447   KETONESUR NEGATIVE 11/25/2017 0009   PROTEINUR NEGATIVE 11/25/2017 0009   UROBILINOGEN 0.2 05/11/2014 1352   NITRITE NEGATIVE 11/25/2017 0009   LEUKOCYTESUR NEGATIVE 11/25/2017 0009   Sepsis Labs: @LABRCNTIP (procalcitonin:4,lacticidven:4) )No results found for this or any previous visit (from the past 240 hour(s)).   Radiological Exams on Admission: Dg Chest Port 1 View  Result Date: 01/21/2018 CLINICAL DATA:  Shortness of breath tonight. EXAM: PORTABLE CHEST 1  VIEW COMPARISON:  Radiograph 11/28/2017 FINDINGS: Progressive cardiomegaly with unchanged mediastinal contours. Moderate to severe pulmonary edema with perihilar reticulation and Kerley B-lines. No large pleural effusion. No pneumothorax. IMPRESSION: CHF with increasing cardiomegaly and moderate to severe pulmonary edema. Electronically Signed   By: Jeb Levering M.D.   On: 01/21/2018 01:12    EKG: Independently reviewed. Sinus rhythm, PVC, LBBB, similar to  prior.   Assessment/Plan  1. Acute on chronic combined CHF  - Presents with acute respiratory distress, started on BiPAP, and found to have pulmonary edema - No chest pain, troponin wnl, denies dietary indiscretion, uncertain precipitant  - Treated with 80 mg IV Lasix in ED, did not respond  - Per nephrology recommendations, continue diuresis with 200 mg IV Lasix q6h and if no response in ~12 hrs, will likely need HD  - SLIV, fluid-restrict, follow daily wt and strict I/O's, continue diuresis with 200 mg IV Lasix q6h, continue beta-blocker, update echo, follow daily chem panel, continue cardiac monitoring   2. HIV/AIDS  - CD4 300 and VL 28 last month  - Continue Tivicay and Descovy    3. CKD stage V not on dialysis   - SCr is 4.53 on admission, consistent with his apparent baseline  - Nephrology consulting and much appreciated     DVT prophylaxis: sq heparin  Code Status: Full  Family Communication: Discussed with patient Disposition Plan: Admit to SDU Consults called: Nephrology Admission status: Inpatient     Vianne Bulls, MD [facility] Pager [phone]  If 7PM-7AM, please contact night-coverage [email or web address] Password [email or web address]  01/21/2018, 4:05 AM

## 2018-01-21 NOTE — ED Notes (Signed)
Pt tolerating BIPAP well, warm blankets provided for patient and visitor

## 2018-01-21 NOTE — ED Provider Notes (Signed)
[facility] EMERGENCY DEPARTMENT Provider Note   CSN: [id number] Arrival date & time: 01/21/18  2800     History   Chief Complaint Chief Complaint  Patient presents with  . Respiratory Distress    HPI David [name] is a 77 y.o. male.  Patient presents to the ER for evaluation of difficulty breathing.  Patient reports that he was feeling well all day yesterday.  He went to bed feeling fine, but awakened tonight with sudden onset severe shortness of breath.  Family reports that he was recently hospitalized for pneumonia and that it might have come back.  He has not had any fever, has not had any cough or chest congestion.  He denies chest pain.      Past Medical History:  Diagnosis Date  . Anemia, iron deficiency 11/15/2011  . Arthritis   . Cancer [facility])    hx of prostate; s/p radioactive seed implant 10/2009 Dr Janice Norrie  . Chronic kidney disease   . Glaucoma   . Heart murmur    "mild" per pt  . History of cardiac cath 2004   negative for CAD  . History of myocardial perfusion scan 02/2010   negative for coronary insufficiency (LVEF 27%)  . HIV infection ([place])   . Hyperlipidemia   . Hypertension    followed by [facility] and Vascular (Dr Dani Gobble Croitoru)  . Nonischemic cardiomyopathy ([place])   . Sinus bradycardia   . Stroke [facility])    "mini stroke" years ago  . Systolic and diastolic CHF, acute ([place]) 06/2010   felt to be secondary to hypertensive cardiomyopathy    Patient Active Problem List   Diagnosis Date Noted  . CAP (community acquired pneumonia)   . Aspiration pneumonia of both lower lobes due to regurgitated food ([place])   . Sepsis due to pneumonia ([place]) 11/22/2017  . Acute renal failure superimposed on stage 4 chronic kidney disease ([place]) 11/22/2017  . 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 AIDS ([place]) 05/14/2014  . 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 ARF (acute renal failure) ([place]) 05/06/2014  . Gastroparesis 05/06/2014  . Protein-calorie  malnutrition, severe ([place]) 05/05/2014  . Severe malnutrition ([place]) 05/04/2014  . CKD (chronic kidney disease) stage 4, GFR 15-29 ml/min (HCC) 05/02/2014  . Hypotension 05/02/2014  . Loss of weight 04/29/2014  . Conjunctivitis 04/29/2014  . Nonischemic dilated cardiomyopathy ([place]) 09/10/2013  . Renal insufficiency 04/18/2013  . Low back pain 04/09/2012  . Osteoarthritis of hip 12/29/2011  . Iron deficiency anemia 06/28/2011  . Macrocytic anemia 04/02/2011  . 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 Hyperlipidemia 11/30/2010  . Essential hypertension 11/30/2010  . Chronic combined systolic and diastolic heart failure ([place]) 11/30/2010  . Transient cerebral ischemia 11/30/2010  . HYPERGLYCEMIA 11/30/2010    Past Surgical History:  Procedure Laterality Date  . AV FISTULA PLACEMENT Right 10/13/2016   Procedure: ARTERIOVENOUS (AV) FISTULA CREATION;  Surgeon: [name], MD;  Location: [place];  Service: Vascular;  Laterality: Right;  . BASCILIC VEIN TRANSPOSITION Left 06/24/2015   Procedure: BASILIC VEIN TRANSPOSITION;  Surgeon: Mal Misty, MD;  Location: [place];  Service: Vascular;  Laterality: Left;  . CARDIAC CATHETERIZATION  02/04/2003   mildly depressed LV systolic fx EF 34%,JZPHXT coronaries/abdominal aorta/renal arteries.  . COLONOSCOPY    . ESOPHAGOGASTRODUODENOSCOPY (EGD) WITH PROPOFOL N/A 05/05/2014   Procedure: ESOPHAGOGASTRODUODENOSCOPY (EGD) WITH PROPOFOL;  Surgeon: Missy Sabins, MD;  Location[facility];  Service: Endoscopy;  Laterality: N/A;  . EYE SURGERY  Bilateral    cataract surgery   . EYE SURGERY Bilateral    glaucoma surgery  . NM MYOCAR PERF WALL MOTION  02/21/2010   normal  . RADIOACTIVE SEED IMPLANT  2010   prostate cancer  . US ECHOCARDIOGRAPHY  02/06/2012   mild LVH,LA mod. dilated,mild-mod. 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 AI.       Home Medications    Prior to Admission medications   Medication Sig  Start Date End Date Taking? Authorizing Provider  acetaminophen (TYLENOL) 500 MG tablet Take 500 mg by mouth every 4 (four) hours as needed (for muscle pain).     [provider]  allopurinol (ZYLOPRIM) 100 MG tablet Take 1 tablet (100 mg total) by mouth daily. 01/04/18   Croitoru, Mihai, MD  amLODipine (NORVASC) 10 MG tablet TAKE 1 TABLET EVERY DAY Patient taking differently: Take 10 mg by mouth once a day 01/09/17   Croitoru, Mihai, MD  aspirin 81 MG chewable tablet Place 1 tablet (81 mg total) into feeding tube daily. 12/02/17   Mikhail, Velta Addison, DO  brimonidine (ALPHAGAN) 0.15 % ophthalmic solution Place 1 drop into the left eye 2 (two) times daily.  11/18/15   [provider]  carvedilol (COREG) 25 MG tablet TAKE 1 TABLET TWICE DAILY Patient taking differently: Take 25 mg by mouth two times a day 01/09/17   Croitoru, Mihai, MD  colchicine 0.6 MG tablet Take 1 tablet (0.6 mg total) by mouth daily for 7 days. 12/27/17 01/03/18  Croitoru, Mihai, MD  dolutegravir (TIVICAY) 50 MG tablet Take 1 tablet (50 mg total) by mouth daily. 08/22/17   Truman Hayward, MD  dorzolamide-timolol (COSOPT) 22.3-6.8 MG/ML ophthalmic solution Place 1 drop into the left eye 2 (two) times daily.     [provider]  emtricitabine-tenofovir AF (DESCOVY) 200-25 MG tablet Take 1 tablet by mouth daily. 08/22/17   Truman Hayward, MD  furosemide (LASIX) 40 MG tablet Take 2 tablets (80 mg total) by mouth daily. Patient taking differently: Take 80 mg by mouth every morning.  09/14/17 09/09/18  Croitoru, Mihai, MD  latanoprost (XALATAN) 0.005 % ophthalmic solution Place 1 drop into the left eye at bedtime.     [provider]  Vitamin D, Ergocalciferol, (DRISDOL) 50000 UNITS CAPS capsule Take 50,000 units by mouth once a week on Saturdays 04/10/15   [provider]    Family History Family History  Problem Relation Age of Onset  . Hypertension Mother   . Thyroid disease Mother     . Cholelithiasis Daughter   . Cholelithiasis Son   . Hypertension Maternal Grandmother   . Diabetes Maternal Grandmother   . Heart attack Neg Hx   . Hyperlipidemia Neg Hx     Social History Social History   Tobacco Use  . Smoking status: Former Smoker    Last attempt to quit: 12/04/2005    Years since quitting: 12.1  . Smokeless tobacco: Never Used  Substance Use Topics  . Alcohol use: Yes    Comment: once a week-wine  . Drug use: No     Allergies   Codeine and Cozaar [losartan potassium]   Review of Systems Review of Systems  Respiratory: Positive for shortness of breath.   All other systems reviewed and are negative.    Physical Exam Updated Vital Signs BP 127/86   Pulse 83   Temp (!) 97.5 F (36.4 C) (Tympanic)   Resp (!) 21  Ht 6\' 2"  (1.88 m)   Wt 94.3 kg (208 lb)   SpO2 100%   BMI 26.71 kg/m   Physical Exam  Constitutional: He is oriented to person, place, and time. He appears well-developed and well-nourished. He appears distressed.  HENT:  Head: Normocephalic and atraumatic.  Right Ear: Hearing normal.  Left Ear: Hearing normal.  Nose: Nose normal.  Mouth/Throat: Oropharynx is clear and moist and mucous membranes are normal.  Eyes: Conjunctivae and EOM are normal. Pupils are equal, round, and reactive to light.  Neck: Normal range of motion. Neck supple.  Cardiovascular: Regular rhythm, S1 normal and S2 normal. Exam reveals no gallop and no friction rub.  No murmur heard. Pulmonary/Chest: He is in respiratory distress. He has rales. He exhibits no tenderness.  Abdominal: Soft. Normal appearance and bowel sounds are normal. There is no hepatosplenomegaly. There is no tenderness. There is no rebound, no guarding, no tenderness at McBurney's point and negative Murphy's sign. No hernia.  Musculoskeletal: Normal range of motion.  Neurological: He is alert and oriented to person, place, and time. He has normal strength. No cranial nerve deficit or  sensory deficit. Coordination normal. GCS eye subscore is 4. GCS verbal subscore is 5. GCS motor subscore is 6.  Skin: Skin is warm, dry and intact. No rash noted. No cyanosis.  Psychiatric: He has a normal mood and affect. His speech is normal and behavior is normal. Thought content normal.  Nursing note and vitals reviewed.    ED Treatments / Results  Labs (all labs ordered are listed, but only abnormal results are displayed) Labs Reviewed  CBC WITH DIFFERENTIAL/PLATELET - Abnormal; Notable for the following components:      Result Value   WBC 13.4 (*)    RBC 3.78 (*)    Hemoglobin 12.1 (*)    HCT 38.5 (*)    MCV 101.9 (*)    Lymphs Abs 4.8 (*)    All other components within normal limits  COMPREHENSIVE METABOLIC PANEL - Abnormal; Notable for the following components:   CO2 19 (*)    Glucose, Bld 241 (*)    BUN 66 (*)    Creatinine, Ser 4.53 (*)    Calcium 8.4 (*)    GFR calc non Af Amer 12 (*)    GFR calc Af Amer 13 (*)    All other components within normal limits  BRAIN NATRIURETIC PEPTIDE - Abnormal; Notable for the following components:   B Natriuretic Peptide 975.9 (*)    All other components within normal limits  I-STAT CG4 LACTIC ACID, ED - Abnormal; Notable for the following components:   Lactic Acid, Venous 2.00 (*)    All other components within normal limits  I-STAT ARTERIAL BLOOD GAS, ED - Abnormal; Notable for the following components:   pH, Arterial 7.293 (*)    Bicarbonate 19.3 (*)    TCO2 21 (*)    Acid-base deficit 7.0 (*)    All other components within normal limits  I-STAT TROPONIN, ED    EKG  EKG Interpretation None      ED ECG REPORT   Date: 01/21/2018  Rate: 102  Rhythm: sinus tachycardia  QRS Axis: left  Intervals: normal  ST/T Wave abnormalities: nonspecific ST/T changes  Conduction Disutrbances:left bundle branch block  Narrative Interpretation:   Old EKG Reviewed: unchanged  I have personally reviewed the EKG tracing and agree  with the computerized printout as noted.  Radiology Dg Chest Port 1 View  Result Date: 01/21/2018  CLINICAL DATA:  Shortness of breath tonight. EXAM: PORTABLE CHEST 1 VIEW COMPARISON:  Radiograph 11/28/2017 FINDINGS: Progressive cardiomegaly with unchanged mediastinal contours. Moderate to severe pulmonary edema with perihilar reticulation and Kerley B-lines. No large pleural effusion. No pneumothorax. IMPRESSION: CHF with increasing cardiomegaly and moderate to severe pulmonary edema. Electronically Signed   By: Jeb Levering M.D.   On: 01/21/2018 01:12    Procedures Procedures (including critical care time)  Medications Ordered in ED Medications  furosemide (LASIX) 120 mg in dextrose 5 % 50 mL IVPB (not administered)  furosemide (LASIX) injection 80 mg (80 mg Intravenous Given 01/21/18 0148)     Initial Impression / Assessment and Plan / ED Course  I have reviewed the triage vital signs and the nursing notes.  Pertinent labs & imaging results that were available during my care of the patient were reviewed by me and considered in my medical decision making (see chart for details).     Patient presents to the emergency department for evaluation of sudden onset of severe shortness of breath.  Examination arrival was concerning for pulmonary edema.  Portable chest x-ray performed just after arrival did confirm this diagnosis.  Patient has a history of chronic renal failure, stage IV.  He has a history of combined systolic and diastolic heart failure as well.  Patient is maintained on Lasix 80 mg daily.  His blood gas revealed hypoxia without CO2 retention.  Based on his severe shortness of breath, however, it was felt that he would improve on BiPAP.  This was initiated and he has tolerated it well, has had significant improvement.  He was administered Lasix 80 mg IV but has not had any urine output.  This was discussed with Dr. Philippa Sicks, on-call for nephrology.  He recommends 200 mg of Lasix  IV every 6 hours for 12 hours.  If no response at that time, would initiate dialysis.  Patient will be admitted for further management.  CRITICAL CARE Performed by: Orpah Greek   Total critical care time: 35 minutes  Critical care time was exclusive of separately billable procedures and treating other patients.  Critical care was necessary to treat or prevent imminent or life-threatening deterioration.  Critical care was time spent personally by me on the following activities: development of treatment plan with patient and/or surrogate as well as nursing, discussions with consultants, evaluation of patient's response to treatment, examination of patient, obtaining history from patient or surrogate, ordering and performing treatments and interventions, ordering and review of laboratory studies, ordering and review of radiographic studies, pulse oximetry and re-evaluation of patient's condition.   Final Clinical Impressions(s) / ED Diagnoses   Final diagnoses:  Acute on chronic combined systolic and diastolic congestive heart failure (HCC)  Acute respiratory failure with hypoxia (HCC)  CRF (chronic renal failure), stage 4 (severe) [facility])    ED Discharge Orders    None       Orpah Greek, MD 01/21/18 [phone]

## 2018-01-21 NOTE — ED Notes (Signed)
The pt was moved to a hospital bed for comfort

## 2018-01-21 NOTE — ED Triage Notes (Signed)
Pt arrived via POV with family, per family pt woke her up @ 2345 last night c/o shob, pt has minor cough yesterday otherwise no distress. Audible rhonchi noted, RR 30-40, family states pt inpt for PNA in December.

## 2018-01-21 NOTE — ED Notes (Signed)
Paged admitting doctor, Rodena Piety to RN Abigail Butts per her request

## 2018-01-21 NOTE — ED Notes (Signed)
Phlebotomy to collect istat trop

## 2018-01-21 NOTE — ED Notes (Signed)
Attempted to call report

## 2018-01-21 NOTE — ED Notes (Signed)
Rodena Piety, MD at bedside, lunch tray ordered

## 2018-01-21 NOTE — Consult Note (Signed)
TIFFANY TALARICO Admit Date: 01/21/2018 01/21/2018 Rexene Agent Requesting Physician:  Zigmund Daniel MD  Reason for Consult:  CKD5, SOB HPI:  77 year old male admitted 01/21/18 with acute onset of dyspnea at home.  Presented with tachypnea, respiratory distress requiring BiPAP.  Chest x-ray demonstrated mild to moderate pulmonary edema.  Received bolus Lasix, eventually 200 mg with good response.  With ongoing diuretics he has improved to room air, with normal blood pressures, and feels well, in fact wanting to go home.  Total documented urine output thus far is 2.4 L.  PMH Incudes:  CKD 5 follows with Dr. Posey Pronto in our office with last office visit on 12/19/17.  Creatinine stable in the mid fours.  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  Plan is for an AV graft but he has not been referred yet for placement.  HIV well controlled on antiretrovirals  History of prostate cancer status post brachytherapy  History of systolic chronic heart failure from a nonischemic cardiomyopathy with last LVEF reported at 20-25%  Hypertension on amlodipine, carvedilol, furosemide  Most recent labs show stable but low renal function with a BUN of 66, creatinine 4.5, bicarbonate 19, potassium 3.5.  Hemoglobin is 11.8.     Creat (mg/dL)  Date Value  12/17/2017 4.84 (H)  05/22/2017 3.67 (H)  12/05/2016 3.41 (H)  06/12/2016 3.27 (H)  02/16/2016 3.12 (H)  11/10/2015 2.91 (H)  06/02/2015 3.74 (H)  05/17/2015 3.46 (H)  12/16/2014 2.87 (H)  07/08/2014 2.76 (H)   Creatinine, Ser (mg/dL)  Date Value  01/21/2018 4.48 (H)  01/21/2018 4.53 (H)  12/13/2017 4.17 (H)  12/01/2017 4.96 (H)  11/30/2017 4.95 (H)  11/28/2017 4.79 (H)  11/27/2017 4.59 (H)  11/26/2017 4.58 (H)  11/25/2017 4.76 (H)  11/25/2017 4.64 (H)  ] I/Os: I/O last 3 completed shifts: In: -  Out: 875 [Urine:875]   ROS NSAIDS: No exposure IV Contrast no exposure TMP/SMX no  exposure Hypotension none Balance of 12 systems is negative w/ exceptions as above  PMH  Past Medical History:  Diagnosis Date  . Anemia, iron deficiency 11/15/2011  . Arthritis   . Cancer [facility])    hx of prostate; s/p radioactive seed implant 10/2009 Dr Janice Norrie  . Chronic kidney disease   . Glaucoma   . Heart murmur    "mild" per pt  . History of cardiac cath 2004   negative for CAD  . History of myocardial perfusion scan 02/2010   negative for coronary insufficiency (LVEF 27%)  . HIV infection ([place])   . Hyperlipidemia   . Hypertension    followed by [facility] and Vascular (Dr Dani Gobble Croitoru)  . Nonischemic cardiomyopathy ([place])   . Sinus bradycardia   . Stroke [facility])    "mini stroke" years ago  . Systolic and diastolic CHF, acute ([place]) 06/2010   felt to be secondary to hypertensive cardiomyopathy   PSH  Past Surgical History:  Procedure Laterality Date  . AV FISTULA PLACEMENT Right 10/13/2016   Procedure: ARTERIOVENOUS (AV) FISTULA CREATION;  Surgeon: [name], MD;  Location: [place];  Service: Vascular;  Laterality: Right;  . BASCILIC VEIN TRANSPOSITION Left 06/24/2015   Procedure: BASILIC VEIN TRANSPOSITION;  Surgeon: Mal Misty, MD;  Location: [place];  Service: Vascular;  Laterality: Left;  . CARDIAC CATHETERIZATION  02/04/2003   mildly depressed LV systolic fx EF 58%,NIDPOE coronaries/abdominal aorta/renal arteries.  . COLONOSCOPY    . ESOPHAGOGASTRODUODENOSCOPY (EGD) WITH PROPOFOL N/A 05/05/2014  Procedure: ESOPHAGOGASTRODUODENOSCOPY (EGD) WITH PROPOFOL;  Surgeon: Missy Sabins, MD;  [facility];  Service: Endoscopy;  Laterality: N/A;  . EYE SURGERY Bilateral    cataract surgery   . EYE SURGERY Bilateral    glaucoma surgery  . NM MYOCAR PERF WALL MOTION  02/21/2010   normal  . RADIOACTIVE SEED IMPLANT  2010   prostate cancer  . US ECHOCARDIOGRAPHY  02/06/2012   mild LVH,LA mod. dilated,mild-mod. 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 AI.   FH  Family History  Problem Relation Age of Onset  . Hypertension Mother   . Thyroid disease Mother   . Cholelithiasis Daughter   . Cholelithiasis Son   . Hypertension Maternal Grandmother   . Diabetes Maternal Grandmother   . Heart attack Neg Hx   . Hyperlipidemia Neg Hx    SH  reports that he quit smoking about 12 years ago. he has never used smokeless tobacco. He reports that he drinks alcohol. He reports that he does not use drugs. Allergies  Allergies  Allergen Reactions  . Codeine Other (See Comments)    Causes constipation  . Cozaar [Losartan Potassium] Other (See Comments)    Causes constipation    Home medications Prior to Admission medications   Medication Sig Start Date End Date Taking? Authorizing Provider  acetaminophen (TYLENOL) 500 MG tablet Take 500 mg by mouth every 4 (four) hours as needed (for muscle pain).    Yes [provider]  amLODipine (NORVASC) 10 MG tablet TAKE 1 TABLET EVERY DAY Patient taking differently: Take 10 mg by mouth once a day 01/09/17  Yes Croitoru, Mihai, MD  aspirin 81 MG chewable tablet Place 1 tablet (81 mg total) into feeding tube daily. 12/02/17  Yes Mikhail, Maryann, DO  brimonidine (ALPHAGAN) 0.15 % ophthalmic solution Place 1 drop into the left eye 2 (two) times daily.  11/18/15  Yes [provider]  carvedilol (COREG) 25 MG tablet TAKE 1 TABLET TWICE DAILY Patient taking differently: Take 25 mg by mouth two times a day 01/09/17  Yes Croitoru, Mihai, MD  Chlorphen-Pseudoephed-APAP (CORICIDIN D PO) Take 1 tablet by mouth as needed (cold symptoms).   Yes [provider]  colchicine 0.6 MG tablet Take 1 tablet (0.6 mg total) by mouth daily for 7 days. 12/27/17 01/21/18 Yes Croitoru, Mihai, MD  dolutegravir (TIVICAY) 50 MG tablet Take 1 tablet (50 mg total) by mouth daily. 08/22/17  Yes Tommy Medal, Lavell Islam, MD  dorzolamide-timolol (COSOPT) 22.3-6.8 MG/ML ophthalmic solution Place 1 drop into the  left eye 2 (two) times daily.    Yes [provider]  emtricitabine-tenofovir AF (DESCOVY) 200-25 MG tablet Take 1 tablet by mouth daily. 08/22/17  Yes Tommy Medal, Lavell Islam, MD  furosemide (LASIX) 40 MG tablet Take 2 tablets (80 mg total) by mouth daily. Patient taking differently: Take 80 mg by mouth every morning.  09/14/17 09/09/18 Yes Croitoru, Mihai, MD  latanoprost (XALATAN) 0.005 % ophthalmic solution Place 1 drop into the left eye at bedtime.    Yes [provider]  Vitamin D, Ergocalciferol, (DRISDOL) 50000 UNITS CAPS capsule Take 50,000 units by mouth once a week on Saturdays 04/10/15  Yes [provider]  allopurinol (ZYLOPRIM) 100 MG tablet Take 1 tablet (100 mg total) by mouth daily. 01/04/18   Croitoru, Mihai, MD    Current Medications Scheduled Meds: . allopurinol  100 mg Oral Daily  . amLODipine  10 mg Oral Daily  . aspirin  81 mg Oral Daily  . brimonidine  1 drop Left Eye BID  . carvedilol  25 mg Oral BID WC  . dolutegravir  50 mg Oral Daily  . dorzolamide-timolol  1 drop Left Eye BID  . emtricitabine-tenofovir AF  1 tablet Oral Daily  . heparin  5,000 Units Subcutaneous Q8H  . insulin aspart  0-9 Units Subcutaneous Q4H  . latanoprost  1 drop Left Eye QHS  . sodium chloride flush  3 mL Intravenous Q12H   Continuous Infusions: . sodium chloride    . furosemide Stopped (01/21/18 1138)   PRN Meds:.sodium chloride, acetaminophen, ALPRAZolam, ondansetron (ZOFRAN) IV, sodium chloride flush  CBC Recent Labs  Lab 01/21/18 0100 01/21/18 0452  WBC 13.4* 10.6*  NEUTROABS 7.3  --   HGB 12.1* 11.8*  HCT 38.5* 37.0*  MCV 101.9* 101.4*  PLT 313 749   Basic Metabolic Panel Recent Labs  Lab 01/21/18 0100 01/21/18 0452  NA 140  --   K 3.5  --   CL 106  --   CO2 19*  --   GLUCOSE 241*  --   BUN 66*  --   CREATININE 4.53* 4.48*  CALCIUM 8.4*  --     Physical Exam  Blood pressure 123/78, pulse 72, temperature (!) 97.5 F (36.4 C), temperature  source Tympanic, resp. rate (!) 22, height 6\' 2"  (1.88 m), weight 94.3 kg (208 lb), SpO2 95 %. GEN: No distress, sitting in bed, on room air, normal work of breathing, speaks in full sentences ENT: NCAT EYES: EOMI CV: Regular, normal S1 and S2, no rub, no murmur PULM: CTA B ABD: Soft, nontender SKIN: No rashes or lesions EXT: No peripheral edema   Assessment 22M with AoC sCHF Exacerbation, stable CKD5, improved with diuretifc  1. CKD5 2. AoC sCHF Exacerbation  3. HIV on ARVs, controlled 4. HTN on Amlodipine, lasix 80 qAM, Carvedilol 5. Hx/o prostate cancer  Plan 1. We will change to oral furosemide 160 mg 3 times daily 2. Continue carvedilol and amlodipine 3. No indication for hemodialysis, no indication to pursue vascular access at this time 4. Daily weights, Daily Renal Panel, Strict I/Os, Avoid nephrotoxins (NSAIDs, judicious IV Contrast)    Pearson Grippe MD [phone] [phone] 01/21/2018, 3:39 PM

## 2018-01-21 NOTE — ED Notes (Signed)
Called pharmacy to inquire re: timolol and Tivicay tab, pharmacy to tube to POD B tube station

## 2018-01-22 ENCOUNTER — Observation Stay (HOSPITAL_BASED_OUTPATIENT_CLINIC_OR_DEPARTMENT_OTHER): Payer: Medicare HMO

## 2018-01-22 ENCOUNTER — Telehealth: Payer: Self-pay | Admitting: Family

## 2018-01-22 DIAGNOSIS — I340 Nonrheumatic mitral (valve) insufficiency: Secondary | ICD-10-CM | POA: Diagnosis not present

## 2018-01-22 DIAGNOSIS — I5043 Acute on chronic combined systolic (congestive) and diastolic (congestive) heart failure: Secondary | ICD-10-CM | POA: Diagnosis not present

## 2018-01-22 DIAGNOSIS — I5023 Acute on chronic systolic (congestive) heart failure: Secondary | ICD-10-CM | POA: Diagnosis not present

## 2018-01-22 DIAGNOSIS — I132 Hypertensive heart and chronic kidney disease with heart failure and with stage 5 chronic kidney disease, or end stage renal disease: Secondary | ICD-10-CM | POA: Diagnosis not present

## 2018-01-22 DIAGNOSIS — N185 Chronic kidney disease, stage 5: Secondary | ICD-10-CM | POA: Diagnosis not present

## 2018-01-22 LAB — BASIC METABOLIC PANEL
ANION GAP: 15 (ref 5–15)
BUN: 70 mg/dL — ABNORMAL HIGH (ref 6–20)
CHLORIDE: 103 mmol/L (ref 101–111)
CO2: 22 mmol/L (ref 22–32)
Calcium: 8.6 mg/dL — ABNORMAL LOW (ref 8.9–10.3)
Creatinine, Ser: 4.52 mg/dL — ABNORMAL HIGH (ref 0.61–1.24)
GFR calc Af Amer: 13 mL/min — ABNORMAL LOW (ref >60)
GFR calc non Af Amer: 12 mL/min — ABNORMAL LOW (ref >60)
Glucose, Bld: 151 mg/dL — ABNORMAL HIGH (ref 65–99)
POTASSIUM: 3.1 mmol/L — AB (ref 3.5–5.1)
Sodium: 140 mmol/L (ref 135–145)

## 2018-01-22 LAB — GLUCOSE, CAPILLARY
GLUCOSE-CAPILLARY: 143 mg/dL — AB (ref 65–99)
GLUCOSE-CAPILLARY: 86 mg/dL (ref 65–99)
GLUCOSE-CAPILLARY: 112 mg/dL — AB (ref 65–99)
Glucose-Capillary: 79 mg/dL (ref 65–99)

## 2018-01-22 LAB — ECHOCARDIOGRAM COMPLETE
Height: 74 in
Weight: 3181.68 oz

## 2018-01-22 MED ORDER — FUROSEMIDE 80 MG PO TABS: 160.0000 mg | ORAL_TABLET | Freq: Two times a day (BID) | ORAL | 11 refills | Status: DC

## 2018-01-22 MED ORDER — POTASSIUM CHLORIDE ER 10 MEQ PO TBCR: 20.0000 meq | EXTENDED_RELEASE_TABLET | Freq: Every day | ORAL | 0 refills | Status: DC

## 2018-01-22 MED ORDER — PERFLUTREN LIPID MICROSPHERE
1.0000 mL | INTRAVENOUS | Status: DC | PRN
  Administered 2018-01-22: 3 mL via INTRAVENOUS
  Filled 2018-01-22: qty 10

## 2018-01-22 MED ORDER — POTASSIUM CHLORIDE 20 MEQ PO PACK
40.0000 meq | PACK | Freq: Once | ORAL | Status: AC
  Administered 2018-01-22: 40 meq via ORAL
  Filled 2018-01-22: qty 2

## 2018-01-22 NOTE — Progress Notes (Signed)
Admit: 01/21/2018 LOS: 1  5M with AoC sCHF Exacerbation, stable CKD5, improved with diuretic  Subjective:  Still doing well, denies dyspnea, on RA Excellent ongoing UOP, on oral diuretics Morning labs: stable SCr, K 3.1, HCO3 22 No edema BP stable He wants to go home  02/18 0701 - 02/19 0700 In: 310 [P.O.:240; I.V.:70] Out: 4325 [Urine:4325]  Filed Weights   01/21/18 0116 01/22/18 0428  Weight: 94.3 kg (208 lb) 90.2 kg (198 lb 13.7 oz)    Scheduled Meds: . allopurinol  100 mg Oral Daily  . amLODipine  10 mg Oral Daily  . aspirin  81 mg Oral Daily  . brimonidine  1 drop Left Eye BID  . carvedilol  25 mg Oral BID WC  . dolutegravir  50 mg Oral Daily  . dorzolamide-timolol  1 drop Left Eye BID  . emtricitabine-tenofovir AF  1 tablet Oral Daily  . furosemide  160 mg Oral TID  . heparin  5,000 Units Subcutaneous Q8H  . insulin aspart  0-9 Units Subcutaneous Q4H  . latanoprost  1 drop Left Eye QHS  . sodium chloride flush  3 mL Intravenous Q12H   Continuous Infusions: . sodium chloride     PRN Meds:.sodium chloride, acetaminophen, ALPRAZolam, ondansetron (ZOFRAN) IV, sodium chloride flush  Current Labs: reviewed    Physical Exam:  Blood pressure 121/69, pulse 71, temperature 98.2 F (36.8 C), temperature source Oral, resp. rate 20, height 6\' 2"  (1.88 m), weight 90.2 kg (198 lb 13.7 oz), SpO2 99 %. GEN: No distress, sitting in bed, on room air, normal work of breathing, speaks in full sentences ENT: NCAT EYES: EOMI CV: Regular, normal S1 and S2, no rub, no murmur PULM: CTA B ABD: Soft, nontender SKIN: No rashes or lesions EXT: No peripheral edema  A 1. 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 AoC sCHF Exacerbation  3. HIV on ARVs, controlled 4. HTN on Amlodipine, lasix 80 qAM, Carvedilol 5. Hx/o prostate cancer  P 1. Lasix dose to 160 mg PO BID at discharge; further dosing by Dr. Posey Pronto after DC 2. Start KCl 22mEq daily 3. Has f/u with Dr Posey Pronto 02/19/18 4. Set up  labs post discharge for review  Pearson Grippe MD 01/22/2018, 12:11 PM  Recent Labs  Lab 01/21/18 0100 01/21/18 0452 01/22/18 0335  NA 140  --  140  K 3.5  --  3.1*  CL 106  --  103  CO2 19*  --  22  GLUCOSE 241*  --  151*  BUN 66*  --  70*  CREATININE 4.53* 4.48* 4.52*  CALCIUM 8.4*  --  8.6*   Recent Labs  Lab 01/21/18 0100 01/21/18 0452  WBC 13.4* 10.6*  NEUTROABS 7.3  --   HGB 12.1* 11.8*  HCT 38.5* 37.0*  MCV 101.9* 101.4*  PLT 313 258

## 2018-01-22 NOTE — Progress Notes (Signed)
  Echocardiogram 2D Echocardiogram with definity has been performed.  David [name] M 01/22/2018, 2:22 PM

## 2018-01-22 NOTE — Care Management Note (Signed)
Case Management Note  Patient Details  Name: David [name] MRN: [id number] Date of Birth: [birth year]  Subjective/Objective:   Pt presented for Acute on Chronic CHF- pt is from home with wife and plan will be to return home. Pt has DME Cane at home. No DME needs identified.                  Action/Plan: CM discussed disposition needs with patient- plan will be to home without [facility] Services. Pt is aware to contact PCP if he feels like services are needed once at home. No further needs from CM at this time.   Expected Discharge Date:  01/22/18               Expected Discharge Plan:  Home/Self Care  In-House Referral:  NA  Discharge planning Services  CM Consult  Post Acute Care Choice:  NA Choice offered to:  NA  DME Arranged:  N/A DME Agency:  NA  HH Arranged:  NA HH Agency:  NA  Status of Service:  Completed, signed off  If discussed at [place] of Stay Meetings, dates discussed:    Additional Comments:  Bethena Roys, RN 01/22/2018, 2:14 PM

## 2018-01-22 NOTE — Telephone Encounter (Signed)
Please contact patient for TCM follow up.

## 2018-01-22 NOTE — Discharge Summary (Signed)
Physician Discharge Summary  David [name] [id number] DOB: 10/13/1941 DOA: 01/21/2018  PCP: [name], NP  Admit date: 01/21/2018 Discharge date: 01/22/2018  Admitted From: Home Disposition: Home  Recommendations for Outpatient Follow-up:  1. Follow up with PCP in 1-2 weeks Please obtain BMP/CBC [place]: None Equipment/Devices: None Discharge Condition stable Diet recommendation: Renal and cardiac Brief/Interim Summary:77 y.o. 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  Patient reports that he had been in his usual state of health throughout most of the day, but developed some dyspnea at approximately 10 PM that rapidly worsened.  He denies any associated chest pain, fevers, chills, or cough.  Denies lower extremity swelling or tenderness.  Denies dietary indiscretions.  Reports continued adherence with his medications.  ED Course: Upon arrival to the ED, patient is found to be afebrile, saturating adequately on BiPAP, tachypneic in the 30s, and with normal heart rate and blood pressure.  EKG features a sinus rhythm with PVC and LBBB, similar to priors.  Chest x-ray is notable for increased cardiomegaly with moderate to severe pulmonary edema.  Chemistry panel reveals a serum creatinine of 4.53, consistent with his apparent baseline, as well as a glucose of 241.  CBC is notable for a stable microcytic anemia with hemoglobin of 12.1.  Troponin is within the normal limits and BNP is elevated.  Patient was given 80 mg of IV Lasix and started on BiPAP in the ED.  There was no urine output and after a couple hours, nephrology was consulted by the ED physician.  Nephrology recommends Lasix 20 mg IV every 6 hours and will  consider dialysis if no response in about 12 hours.  Patient has had some improvement in work of breathing on BiPAP, remains hemodynamically stable, and will be admitted to the stepdown unit for ongoing evaluation and management of acute on chronic combined CHF.     Discharge Diagnoses:  Principal Problem:   Acute on chronic combined systolic and diastolic CHF (congestive heart failure) (HCC) Active Problems:   Macrocytic anemia   AIDS (HCC)   CKD (chronic kidney disease), stage V (HCC)   Acute respiratory failure with hypoxia (HCC)  1. Acute on chronic combined CHF  - Presents with acute respiratory distress, started on BiPAP, and found to have pulmonary edema - No chest pain, troponin wnl, denies dietary indiscretion, uncertain precipitant  - Treated with 80 mg IV Lasix in ED, did not respond  - Per nephrology recommendations, Lasix 160 mg twice a day upon discharge. 2. HIV/AIDS  - CD4 300 and VL 28 last month  - Continue Tivicay and Descovy    3. CKD stage V not on dialysis   - SCr is 4.53 on admission, consistent with his apparent baseline  - Nephrology consulting and much appreciated       Discharge Instructions  Discharge Instructions    Call MD for:  difficulty breathing, headache or visual disturbances   Complete by:  As directed    Call MD for:  hives   Complete by:  As directed    Call MD for:  persistant dizziness or light-headedness   Complete by:  As directed    Call MD for:  persistant nausea and vomiting   Complete by:  As directed    Call MD  for:  redness, tenderness, or signs of infection (pain, swelling, redness, odor or green/yellow discharge around incision site)   Complete by:  As directed    Call MD for:  severe uncontrolled pain   Complete by:  As directed    Call MD for:  temperature >100.4   Complete by:  As directed    Diet - low sodium heart healthy   Complete by:  As directed    Increase activity slowly   Complete by:  As directed       Allergies as of 01/22/2018      Reactions   Codeine Other (See Comments)   Causes constipation   Cozaar [losartan Potassium] Other (See Comments)   Causes constipation      Medication List    STOP taking these medications   acetaminophen 500 MG tablet Commonly known as:  TYLENOL   CORICIDIN D PO     TAKE these medications   allopurinol 100 MG tablet Commonly known as:  ZYLOPRIM Take 1 tablet (100 mg total) by mouth daily.   amLODipine 10 MG tablet Commonly known as:  NORVASC TAKE 1 TABLET EVERY DAY What changed:    how much to take  how to take this  when to take this   aspirin 81 MG chewable tablet Place 1 tablet (81 mg total) into feeding tube daily.   brimonidine 0.15 % ophthalmic solution Commonly known as:  ALPHAGAN Place 1 drop into the left eye 2 (two) times daily.   carvedilol 25 MG tablet Commonly known as:  COREG TAKE 1 TABLET TWICE DAILY What changed:    how much to take  how to take this  when to take this   colchicine 0.6 MG tablet Take 1 tablet (0.6 mg total) by mouth daily for 7 days.   dolutegravir 50 MG tablet Commonly known as:  TIVICAY Take 1 tablet (50 mg total) by mouth daily.   dorzolamide-timolol 22.3-6.8 MG/ML ophthalmic solution Commonly known as:  COSOPT Place 1 drop into the left eye 2 (two) times daily.   emtricitabine-tenofovir AF 200-25 MG tablet Commonly known as:  DESCOVY Take 1 tablet by mouth daily.   furosemide 80 MG tablet Commonly known as:  LASIX Take 2 tablets (160 mg total) by mouth 2 (two) times daily. What changed:    medication strength  how much to take  when to take this   latanoprost 0.005 % ophthalmic solution Commonly known as:  XALATAN Place 1 drop into the left eye at bedtime.   potassium chloride 10 MEQ tablet Commonly known as:  K-DUR Take 2 tablets (20 mEq total) by mouth daily.   Vitamin D (Ergocalciferol) 50000 units Caps capsule Commonly known as:  DRISDOL Take 50,000  units by mouth once a week on Saturdays       Allergies  Allergen Reactions  . Codeine Other (See Comments)    Causes constipation  . Cozaar [Losartan Potassium] Other (See Comments)    Causes constipation     Consultations:   Procedures/Studies: Dg Chest Port 1 View  Result Date: 01/21/2018 CLINICAL DATA:  Hypoxia short of breath CHF EXAM: PORTABLE CHEST 1 VIEW COMPARISON:  01/21/2018 FINDINGS: Improvement in congestive heart failure and pulmonary edema. Mild residual edema. No significant pleural effusion. Mild bibasilar atelectasis has improved. IMPRESSION: Interval improvement in congestive heart failure and pulmonary edema Improvement in bibasilar atelectasis. Electronically Signed   By: Franchot Gallo M.D.   On: 01/21/2018 08:25   Dg Chest Port 1  View  Result Date: 01/21/2018 CLINICAL DATA:  Shortness of breath tonight. EXAM: PORTABLE CHEST 1 VIEW COMPARISON:  Radiograph 11/28/2017 FINDINGS: Progressive cardiomegaly with unchanged mediastinal contours. Moderate to severe pulmonary edema with perihilar reticulation and Kerley B-lines. No large pleural effusion. No pneumothorax. IMPRESSION: CHF with increasing cardiomegaly and moderate to severe pulmonary edema. Electronically Signed   By: Jeb Levering M.D.   On: 01/21/2018 01:12    (Echo, Carotid, EGD, Colonoscopy, ERCP)    Subjective: Patient awake alert talking on the phone in no acute distress wanting to go home.   Discharge Exam: Vitals:   01/22/18 0809 01/22/18 1114  BP: 113/83 121/69  Pulse:  71  Resp:  20  Temp:  98.2 F (36.8 C)  SpO2:  99%   Vitals:   01/22/18 0428 01/22/18 0718 01/22/18 0809 01/22/18 1114  BP: 108/67 114/70 113/83 121/69  Pulse: 70 73  71  Resp: 19 18  20   Temp: 98.1 F (36.7 C) 98.3 F (36.8 C)  98.2 F (36.8 C)  TempSrc: Oral Oral  Oral  SpO2: 97% 98%  99%  Weight: 90.2 kg (198 lb 13.7 oz)     Height:        General: Pt is alert, awake, not in acute  distress Cardiovascular: RRR, S1/S2 +, no rubs, no gallops Respiratory: CTA bilaterally, no wheezing, no rhonchi Abdominal: Soft, NT, ND, bowel sounds + Extremities: no edema, no cyanosis    The results of significant diagnostics from this hospitalization (including imaging, microbiology, ancillary and laboratory) are listed below for reference.     Microbiology: No results found for this or any previous visit (from the past 240 hour(s)).   Labs: BNP (last 3 results) Recent Labs    11/22/17 2219 11/24/17 1755 01/21/18 0100  BNP 667.5* 999.1* 563.1*   Basic Metabolic Panel: Recent Labs  Lab 01/21/18 0100 01/21/18 0452 01/22/18 0335  NA 140  --  140  K 3.5  --  3.1*  CL 106  --  103  CO2 19*  --  22  GLUCOSE 241*  --  151*  BUN 66*  --  70*  CREATININE 4.53* 4.48* 4.52*  CALCIUM 8.4*  --  8.6*   Liver Function Tests: Recent Labs  Lab 01/21/18 0100  AST 25  ALT 25  ALKPHOS 113  BILITOT 0.7  PROT 7.4  ALBUMIN 3.8   No results for input(s): LIPASE, AMYLASE in the last 168 hours. No results for input(s): AMMONIA in the last 168 hours. CBC: Recent Labs  Lab 01/21/18 0100 01/21/18 0452  WBC 13.4* 10.6*  NEUTROABS 7.3  --   HGB 12.1* 11.8*  HCT 38.5* 37.0*  MCV 101.9* 101.4*  PLT 313 258   Cardiac Enzymes: Recent Labs  Lab 01/21/18 0452 01/21/18 1124 01/21/18 1559  TROPONINI 0.06* 0.06* 0.06*   BNP: Invalid input(s): POCBNP CBG: Recent Labs  Lab 01/21/18 2113 01/22/18 0030 01/22/18 0434 01/22/18 0716 01/22/18 1113  GLUCAP 117* 79 143* 86 112*   D-Dimer No results for input(s): DDIMER in the last 72 hours. Hgb A1c Recent Labs    01/21/18 0452  HGBA1C 6.1*   Lipid Profile No results for input(s): CHOL, HDL, LDLCALC, TRIG, CHOLHDL, LDLDIRECT in the last 72 hours. Thyroid function studies No results for input(s): TSH, T4TOTAL, T3FREE, THYROIDAB in the last 72 hours.  Invalid input(s): FREET3 Anemia work up No results for input(s):  VITAMINB12, FOLATE, FERRITIN, TIBC, IRON, RETICCTPCT in the last 72 hours. Urinalysis  Component Value Date/Time   COLORURINE YELLOW 11/25/2017 0009   APPEARANCEUR HAZY (A) 11/25/2017 0009   LABSPEC 1.014 11/25/2017 0009   PHURINE 5.0 11/25/2017 0009   GLUCOSEU NEGATIVE 11/25/2017 0009   HGBUR NEGATIVE 11/25/2017 0009   BILIRUBINUR NEGATIVE 11/25/2017 0009   BILIRUBINUR negative 04/16/2013 1447   KETONESUR NEGATIVE 11/25/2017 0009   PROTEINUR NEGATIVE 11/25/2017 0009   UROBILINOGEN 0.2 05/11/2014 1352   NITRITE NEGATIVE 11/25/2017 0009   LEUKOCYTESUR NEGATIVE 11/25/2017 0009   Sepsis Labs Invalid input(s): PROCALCITONIN,  WBC,  LACTICIDVEN Microbiology No results found for this or any previous visit (from the past 240 hour(s)).   Time coordinating discharge: Over 30 minutes  SIGNED:   Georgette Shell, MD  [facility] 01/22/2018, 1:58 PM  If 7PM-7AM, please contact night-coverage [email or web address] Password [email or web address]

## 2018-01-22 NOTE — Care Management Obs Status (Signed)
[place] NOTIFICATION   Patient Details  Name: David [name] MRN: [id number] Date of Birth: Mar 24, 1941   Medicare Observation Status Notification Given:  Yes    Bethena Roys, RN 01/22/2018, 11:50 AM

## 2018-01-22 NOTE — Care Management CC44 (Signed)
Condition Code 44 Documentation Completed  Patient Details  Name: David [name] MRN: [id number] Date of Birth: Dec 09, 1940   Condition Code 44 given:  Yes Patient signature on Condition Code 44 notice:  Yes Documentation of 2 MD's agreement:  Yes Code 44 added to claim:  Yes    Bethena Roys, RN 01/22/2018, 11:51 AM

## 2018-01-23 ENCOUNTER — Telehealth: Payer: Self-pay | Admitting: *Deleted

## 2018-01-23 NOTE — Telephone Encounter (Signed)
Called patient and left message to return call

## 2018-01-23 NOTE — Telephone Encounter (Signed)
David [name] [id number] DOB: Oct 16, 1941 DOA: 01/21/2018  PCP: [name], NP  Admit date: 01/21/2018 Discharge date: 01/22/2018  Admitted From: Home Disposition: Home  Recommendations for Outpatient Follow-up:  1. Follow up with PCP in 1-2 weeks Please obtain BMP/CBC [place]: None Equipment/Devices: None Discharge Condition stable Diet recommendation: Renal and cardiac  Transition Care Management Follow-up Telephone Call    How have you been since you were released from the hospital? Doing okay   Do you understand why you were in the hospital? yes   Do you understand the discharge instructions? yes   Where were you discharged to? Home with wife   Items Reviewed:  Medications reviewed: yes  Allergies reviewed: yes  Dietary changes reviewed: yes  Referrals reviewed: yes   Functional Questionnaire:   Activities of Daily Living (ADLs):   He states they are independent in the following: ambulation, bathing and hygiene, feeding, continence, grooming, toileting and dressing States they require assistance with the following: na   Any transportation issues/concerns?: no   Any patient concerns? no   Confirmed importance and date/time of follow-up visits scheduled yes  Provider Appointment booked with [name] NP 01/24/18 @2   Confirmed with patient if condition begins to worsen call PCP or go to the ER.  Patient was given the office number and encouraged to call back with question or concerns.  : yes

## 2018-01-23 NOTE — Telephone Encounter (Signed)
Please see 01/23/18 phone note with RN. TCM completed by RN.

## 2018-01-23 NOTE — Telephone Encounter (Signed)
Noted  

## 2018-01-24 ENCOUNTER — Encounter: Payer: Self-pay | Admitting: Family

## 2018-01-24 ENCOUNTER — Ambulatory Visit (INDEPENDENT_AMBULATORY_CARE_PROVIDER_SITE_OTHER): Payer: Medicare HMO | Admitting: Family

## 2018-01-24 VITALS — BP 121/76 | HR 74 | Temp 97.5°F | Resp 16 | Ht 74.0 in | Wt 203.0 lb

## 2018-01-24 DIAGNOSIS — B20 Human immunodeficiency virus [HIV] disease: Secondary | ICD-10-CM

## 2018-01-24 DIAGNOSIS — I5033 Acute on chronic diastolic (congestive) heart failure: Secondary | ICD-10-CM

## 2018-01-24 DIAGNOSIS — N189 Chronic kidney disease, unspecified: Secondary | ICD-10-CM

## 2018-01-24 DIAGNOSIS — D649 Anemia, unspecified: Secondary | ICD-10-CM

## 2018-01-24 NOTE — Patient Instructions (Signed)
Please complete lab work prior to leaving. Continue lasix and daily weights. Call if you gain 3 or more pounds overnight or 5 pounds in 1 week. Keep your upcoming appointments with your kidney doctor and cardiology.

## 2018-01-24 NOTE — Progress Notes (Signed)
Subjective:    Patient ID: David [name], male    DOB: May 05, 1941, 77 y.o.   MRN: [id number]  HPI  David [name] is a 77 yr old male who presents today for hospital follow up.  Discharge summary is reviewed.  Patient presented to the emergency department with chief complaint of shortness of breath.  He was placed on BiPAP during his visit to the emergency department.  At that time he was tachypneic with respiratory rate in the 30s.  He was noted to have pulmonary edema on chest x-ray.  Creatinine was significantly elevated at 4.53.  He was treated with IV Lasix as well as BiPAP in the ED.  Due to inadequate urinary output nephrology was consulted.  He was admitted to the stepdown unit for ongoing evaluation and management of his acute on chronic CHF.  CHF-per nephrology it was recommended that he be discharged home on Lasix 160 mg twice daily. Denies current SOB. Denies recent fever. Since returning home he reports feeling great. Denies SOB.  Has been weighing daily.   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  He is followed by ID.   Lab Results  Component Value Date   CD4TCELL 12 (L) 12/17/2017   CD4TABS 300 (L) 12/17/2017   Wt Readings from Last 3 Encounters:  01/24/18 203 lb (92.1 kg)  01/22/18 198 lb 13.7 oz (90.2 kg)  12/31/17 208 lb (94.3 kg)      Review of Systems See HPI  Past Medical History:  Diagnosis Date  . Anemia, iron deficiency 11/15/2011  . Arthritis   . Cancer [facility])    hx of prostate; s/p radioactive seed implant 10/2009 Dr Janice Norrie  . Chronic kidney disease   . Glaucoma   . Heart murmur    "mild" per pt  . History of cardiac cath 2004   negative for CAD  . History of myocardial perfusion scan 02/2010   negative for coronary insufficiency (LVEF 27%)  . HIV infection ([place])   . Hyperlipidemia   . Hypertension    followed by [facility] (Dr Dani Gobble Croitoru)  . Nonischemic cardiomyopathy ([place])   . Sinus bradycardia   . Stroke  [facility])    "mini stroke" years ago  . Systolic and diastolic CHF, acute ([place]) 06/2010   felt to be secondary to hypertensive cardiomyopathy     Social History   Socioeconomic History  . Marital status: Married    Spouse name: Not on file  . Number of children: 2  . Years of education: 67  . Highest education level: Not on file  Social Needs  . Financial resource strain: Not on file  . Food insecurity - worry: Not on file  . Food insecurity - inability: Not on file  . Transportation needs - medical: Not on file  . Transportation needs - non-medical: Not on file  Occupational History  . 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 Smoking status: Former Smoker    Last attempt to quit: 12/04/2005    Years since quitting: 12.1  . Smokeless tobacco: Never Used  Substance and Sexual Activity  . Alcohol use: Yes    Comment: once a week-wine  . Drug use: No  . Sexual activity: Not on file  Other Topics Concern  . Not on file  Social History Narrative   Stays active at home   Regular exercise: no   Drinks 2 cups of coffee  a week, 1 mountain dew soda a day.    Past Surgical History:  Procedure Laterality Date  . AV FISTULA PLACEMENT Right 10/13/2016   Procedure: ARTERIOVENOUS (AV) FISTULA CREATION;  Surgeon: [name], MD;  Location: [facility];  Service: Vascular;  Laterality: Right;  . BASCILIC VEIN TRANSPOSITION Left 06/24/2015   Procedure: BASILIC VEIN TRANSPOSITION;  Surgeon: Mal Misty, MD;  Location: [place];  Service: Vascular;  Laterality: Left;  . CARDIAC CATHETERIZATION  02/04/2003   mildly depressed LV systolic fx EF 86%,PYPPJK coronaries/abdominal aorta/renal arteries.  . COLONOSCOPY    . ESOPHAGOGASTRODUODENOSCOPY (EGD) WITH PROPOFOL N/A 05/05/2014   Procedure: ESOPHAGOGASTRODUODENOSCOPY (EGD) WITH PROPOFOL;  Surgeon: Missy Sabins, MD;  Location: [facility];  Service: Endoscopy;  Laterality: N/A;  . EYE SURGERY Bilateral    cataract  surgery   . EYE SURGERY Bilateral    glaucoma surgery  . NM MYOCAR PERF WALL MOTION  02/21/2010   normal  . RADIOACTIVE SEED IMPLANT  2010   prostate cancer  . US ECHOCARDIOGRAPHY  02/06/2012   mild LVH,LA mod. dilated,mild-mod. 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 AI.    Family History  Problem Relation Age of Onset  . Hypertension Mother   . Thyroid disease Mother   . Cholelithiasis Daughter   . Cholelithiasis Son   . Hypertension Maternal Grandmother   . Diabetes Maternal Grandmother   . Heart attack Neg Hx   . Hyperlipidemia Neg Hx     Allergies  Allergen Reactions  . Codeine Other (See Comments)    Causes constipation  . Cozaar [Losartan Potassium] Other (See Comments)    Causes constipation     Current Outpatient Medications on File Prior to Visit  Medication Sig Dispense Refill  . allopurinol (ZYLOPRIM) 100 MG tablet Take 1 tablet (100 mg total) by mouth daily. 90 tablet 3  . amLODipine (NORVASC) 10 MG tablet TAKE 1 TABLET EVERY DAY (Patient taking differently: Take 10 mg by mouth once a day) 90 tablet 3  . aspirin 81 MG chewable tablet Place 1 tablet (81 mg total) into feeding tube daily.    . brimonidine (ALPHAGAN) 0.15 % ophthalmic solution Place 1 drop into the left eye 2 (two) times daily.   1  . carvedilol (COREG) 25 MG tablet TAKE 1 TABLET TWICE DAILY (Patient taking differently: Take 25 mg by mouth two times a day) 180 tablet 3  . colchicine 0.6 MG tablet Take 1 tablet (0.6 mg total) by mouth daily for 7 days. 30 tablet 0  . dolutegravir (TIVICAY) 50 MG tablet Take 1 tablet (50 mg total) by mouth daily. 30 tablet 11  . dorzolamide-timolol (COSOPT) 22.3-6.8 MG/ML ophthalmic solution Place 1 drop into the left eye 2 (two) times daily.     Marland Kitchen emtricitabine-tenofovir AF (DESCOVY) 200-25 MG tablet Take 1 tablet by mouth daily. 30 tablet 11  . furosemide (LASIX) 80 MG tablet Take 2 tablets (160 mg total) by mouth 2 (two) times daily. 60  tablet 11  . latanoprost (XALATAN) 0.005 % ophthalmic solution Place 1 drop into the left eye at bedtime.     . potassium chloride (K-DUR) 10 MEQ tablet Take 2 tablets (20 mEq total) by mouth daily. 30 tablet 0  . Vitamin D, Ergocalciferol, (DRISDOL) 50000 UNITS CAPS capsule Take 50,000 units by mouth once a week on Saturdays  2   No current facility-administered medications on file prior to visit.     BP 121/76 (BP Location:  Left Arm, Patient Position: Sitting, Cuff Size: Large)   Pulse 74   Temp (!) 97.5 F (36.4 C) (Oral)   Resp 16   Ht 6\' 2"  (1.88 m)   Wt 203 lb (92.1 kg)   SpO2 100%   BMI 26.06 kg/m       Objective:   Physical Exam  Constitutional: He is oriented to person, place, and time. He appears well-developed and well-nourished. No distress.  HENT:  Head: Normocephalic and atraumatic.  Cardiovascular: Normal rate and regular rhythm.  No murmur heard. Pulmonary/Chest: Effort normal and breath sounds normal. No respiratory distress. He has no wheezes. He has no rales.  Musculoskeletal: He exhibits no edema.  Neurological: He is alert and oriented to person, place, and time.  Skin: Skin is warm and dry.  Psychiatric: He has a normal mood and affect. His behavior is normal. Thought content normal.          Assessment & Plan:  Acute on chronic CHF- clinically euvolemic today. Weight is up 5 pounds but he is wearing clothes and shoes.  I have advised pt to continue lasix.  He is advised to call if you gain 3 or more pounds overnight or 5 pounds in 1 week. He is advised to keep his upcoming appointment with cardiology in April.   HIV/AIDS- clinically stable. Management per ID.  Chronic Renal Failure- check follow up  BMET today. He has upcoming appointment with nephrology in a few weeks.    Anemia- check follow up A1C.

## 2018-01-25 ENCOUNTER — Encounter: Payer: Self-pay | Admitting: Infectious Disease

## 2018-01-25 ENCOUNTER — Telehealth: Payer: Self-pay | Admitting: *Deleted

## 2018-01-25 LAB — BASIC METABOLIC PANEL
BUN: 77 mg/dL — ABNORMAL HIGH (ref 6–23)
CHLORIDE: 103 meq/L (ref 96–112)
CO2: 23 meq/L (ref 19–32)
Calcium: 9.5 mg/dL (ref 8.4–10.5)
Creatinine, Ser: 4.92 mg/dL (ref 0.40–1.50)
GFR: 14.87 mL/min — CL (ref >60.00)
Glucose, Bld: 117 mg/dL — ABNORMAL HIGH (ref 70–99)
POTASSIUM: 4.3 meq/L (ref 3.5–5.1)
SODIUM: 140 meq/L (ref 135–145)

## 2018-01-25 LAB — CBC WITH DIFFERENTIAL/PLATELET
BASOS ABS: 0.1 10*3/uL (ref 0.0–0.1)
Basophils Relative: 1.2 % (ref 0.0–3.0)
Eosinophils Absolute: 0.3 10*3/uL (ref 0.0–0.7)
Eosinophils Relative: 3.8 % (ref 0.0–5.0)
HCT: 36.5 % — ABNORMAL LOW (ref 39.0–52.0)
HEMOGLOBIN: 11.9 g/dL — AB (ref 13.0–17.0)
LYMPHS ABS: 1.8 10*3/uL (ref 0.7–4.0)
Lymphocytes Relative: 20.6 % (ref 12.0–46.0)
MCHC: 32.6 g/dL (ref 30.0–36.0)
MCV: 101.8 fl — ABNORMAL HIGH (ref 78.0–100.0)
MONO ABS: 0.8 10*3/uL (ref 0.1–1.0)
Monocytes Relative: 8.7 % (ref 3.0–12.0)
NEUTROS PCT: 65.7 % (ref 43.0–77.0)
Neutro Abs: 5.9 10*3/uL (ref 1.4–7.7)
Platelets: 254 10*3/uL (ref 150.0–400.0)
RBC: 3.58 Mil/uL — AB (ref 4.22–5.81)
RDW: 15.3 % (ref 11.5–15.5)
WBC: 8.9 10*3/uL (ref 4.0–10.5)

## 2018-01-25 NOTE — Telephone Encounter (Signed)
CRITICAL VALUE STICKER  CRITICAL VALUE: Creatinine 4.92          GFR  14.87  RECEIVER (on-site recipient of call):  DATE & TIME NOTIFIED: 01/25/18 1:05pm  MESSENGER (representative from lab):  [facility]  MD NOTIFIED:   [name], NP  TIME OF NOTIFICATION: 1:10pm  RESPONSE:

## 2018-01-25 NOTE — Telephone Encounter (Signed)
Reviewed.See result note. 

## 2018-01-25 NOTE — Telephone Encounter (Signed)
Notified pt and he voices understanding.  Notes recorded by [name], NP on 01/25/2018 at 1:17 PM EST Please let pt know that his kidney function has worsened slightly. He should go to ER if he develops nausea/vomitting, decreased urinary output. Keep upcoming appointment with his nephrologist, Dr. Posey Pronto. I will also forward these results to his nephrologist.

## 2018-02-06 DIAGNOSIS — M10372 Gout due to renal impairment, left ankle and foot: Secondary | ICD-10-CM | POA: Diagnosis not present

## 2018-02-07 LAB — URIC ACID: URIC ACID: 8.9 mg/dL — AB (ref 3.7–8.6)

## 2018-02-13 DIAGNOSIS — N184 Chronic kidney disease, stage 4 (severe): Secondary | ICD-10-CM | POA: Diagnosis not present

## 2018-02-13 DIAGNOSIS — D631 Anemia in chronic kidney disease: Secondary | ICD-10-CM | POA: Diagnosis not present

## 2018-02-15 ENCOUNTER — Other Ambulatory Visit: Payer: Self-pay | Admitting: Cardiovascular Disease

## 2018-02-17 ENCOUNTER — Other Ambulatory Visit: Payer: Self-pay

## 2018-02-17 ENCOUNTER — Inpatient Hospital Stay (HOSPITAL_COMMUNITY)
Admission: EM | Admit: 2018-02-17 | Discharge: 2018-03-09 | DRG: 252 | Disposition: A | Discharge status: Home / Self Care | Payer: Medicare HMO | Attending: Internal Medicine | Admitting: Internal Medicine

## 2018-02-17 ENCOUNTER — Inpatient Hospital Stay (HOSPITAL_COMMUNITY): Payer: Medicare HMO

## 2018-02-17 ENCOUNTER — Emergency Department (HOSPITAL_COMMUNITY): Payer: Medicare HMO

## 2018-02-17 ENCOUNTER — Encounter (HOSPITAL_COMMUNITY): Payer: Self-pay | Admitting: Cardiovascular Disease

## 2018-02-17 DIAGNOSIS — Z992 Dependence on renal dialysis: Secondary | ICD-10-CM

## 2018-02-17 DIAGNOSIS — I5042 Chronic combined systolic (congestive) and diastolic (congestive) heart failure: Secondary | ICD-10-CM | POA: Diagnosis not present

## 2018-02-17 DIAGNOSIS — Z21 Asymptomatic human immunodeficiency virus [HIV] infection status: Secondary | ICD-10-CM | POA: Diagnosis not present

## 2018-02-17 DIAGNOSIS — I509 Heart failure, unspecified: Secondary | ICD-10-CM | POA: Diagnosis not present

## 2018-02-17 DIAGNOSIS — I5023 Acute on chronic systolic (congestive) heart failure: Secondary | ICD-10-CM | POA: Diagnosis not present

## 2018-02-17 DIAGNOSIS — J9690 Respiratory failure, unspecified, unspecified whether with hypoxia or hypercapnia: Secondary | ICD-10-CM | POA: Diagnosis not present

## 2018-02-17 DIAGNOSIS — R739 Hyperglycemia, unspecified: Secondary | ICD-10-CM | POA: Diagnosis present

## 2018-02-17 DIAGNOSIS — I429 Cardiomyopathy, unspecified: Secondary | ICD-10-CM | POA: Diagnosis present

## 2018-02-17 DIAGNOSIS — J9602 Acute respiratory failure with hypercapnia: Secondary | ICD-10-CM | POA: Diagnosis present

## 2018-02-17 DIAGNOSIS — I120 Hypertensive chronic kidney disease with stage 5 chronic kidney disease or end stage renal disease: Secondary | ICD-10-CM | POA: Diagnosis not present

## 2018-02-17 DIAGNOSIS — J810 Acute pulmonary edema: Secondary | ICD-10-CM | POA: Diagnosis not present

## 2018-02-17 DIAGNOSIS — N179 Acute kidney failure, unspecified: Secondary | ICD-10-CM | POA: Diagnosis not present

## 2018-02-17 DIAGNOSIS — J440 Chronic obstructive pulmonary disease with acute lower respiratory infection: Secondary | ICD-10-CM | POA: Diagnosis present

## 2018-02-17 DIAGNOSIS — T82868A Thrombosis of vascular prosthetic devices, implants and grafts, initial encounter: Secondary | ICD-10-CM | POA: Diagnosis not present

## 2018-02-17 DIAGNOSIS — R092 Respiratory arrest: Secondary | ICD-10-CM

## 2018-02-17 DIAGNOSIS — I472 Ventricular tachycardia: Secondary | ICD-10-CM | POA: Diagnosis present

## 2018-02-17 DIAGNOSIS — I420 Dilated cardiomyopathy: Secondary | ICD-10-CM | POA: Diagnosis present

## 2018-02-17 DIAGNOSIS — E872 Acidosis: Secondary | ICD-10-CM | POA: Diagnosis present

## 2018-02-17 DIAGNOSIS — D631 Anemia in chronic kidney disease: Secondary | ICD-10-CM | POA: Diagnosis present

## 2018-02-17 DIAGNOSIS — B20 Human immunodeficiency virus [HIV] disease: Secondary | ICD-10-CM | POA: Diagnosis not present

## 2018-02-17 DIAGNOSIS — N186 End stage renal disease: Secondary | ICD-10-CM | POA: Diagnosis not present

## 2018-02-17 DIAGNOSIS — E876 Hypokalemia: Secondary | ICD-10-CM | POA: Diagnosis present

## 2018-02-17 DIAGNOSIS — J449 Chronic obstructive pulmonary disease, unspecified: Secondary | ICD-10-CM | POA: Diagnosis not present

## 2018-02-17 DIAGNOSIS — I468 Cardiac arrest due to other underlying condition: Secondary | ICD-10-CM | POA: Diagnosis present

## 2018-02-17 DIAGNOSIS — I480 Paroxysmal atrial fibrillation: Secondary | ICD-10-CM | POA: Diagnosis not present

## 2018-02-17 DIAGNOSIS — I469 Cardiac arrest, cause unspecified: Secondary | ICD-10-CM | POA: Diagnosis not present

## 2018-02-17 DIAGNOSIS — I5084 End stage heart failure: Secondary | ICD-10-CM | POA: Diagnosis present

## 2018-02-17 DIAGNOSIS — K5900 Constipation, unspecified: Secondary | ICD-10-CM | POA: Diagnosis not present

## 2018-02-17 DIAGNOSIS — J9601 Acute respiratory failure with hypoxia: Secondary | ICD-10-CM

## 2018-02-17 DIAGNOSIS — M109 Gout, unspecified: Secondary | ICD-10-CM | POA: Diagnosis present

## 2018-02-17 DIAGNOSIS — N185 Chronic kidney disease, stage 5: Secondary | ICD-10-CM | POA: Diagnosis not present

## 2018-02-17 DIAGNOSIS — I132 Hypertensive heart and chronic kidney disease with heart failure and with stage 5 chronic kidney disease, or end stage renal disease: Secondary | ICD-10-CM | POA: Diagnosis not present

## 2018-02-17 DIAGNOSIS — D649 Anemia, unspecified: Secondary | ICD-10-CM | POA: Diagnosis not present

## 2018-02-17 DIAGNOSIS — I351 Nonrheumatic aortic (valve) insufficiency: Secondary | ICD-10-CM | POA: Diagnosis not present

## 2018-02-17 DIAGNOSIS — J181 Lobar pneumonia, unspecified organism: Secondary | ICD-10-CM | POA: Diagnosis not present

## 2018-02-17 DIAGNOSIS — Z8546 Personal history of malignant neoplasm of prostate: Secondary | ICD-10-CM | POA: Diagnosis not present

## 2018-02-17 DIAGNOSIS — I5043 Acute on chronic combined systolic (congestive) and diastolic (congestive) heart failure: Secondary | ICD-10-CM | POA: Diagnosis not present

## 2018-02-17 DIAGNOSIS — I2699 Other pulmonary embolism without acute cor pulmonale: Secondary | ICD-10-CM | POA: Diagnosis not present

## 2018-02-17 DIAGNOSIS — Z7189 Other specified counseling: Secondary | ICD-10-CM | POA: Diagnosis not present

## 2018-02-17 DIAGNOSIS — E875 Hyperkalemia: Secondary | ICD-10-CM | POA: Diagnosis not present

## 2018-02-17 DIAGNOSIS — C61 Malignant neoplasm of prostate: Secondary | ICD-10-CM | POA: Diagnosis present

## 2018-02-17 DIAGNOSIS — R32 Unspecified urinary incontinence: Secondary | ICD-10-CM | POA: Diagnosis not present

## 2018-02-17 DIAGNOSIS — I428 Other cardiomyopathies: Secondary | ICD-10-CM | POA: Diagnosis not present

## 2018-02-17 DIAGNOSIS — Z8673 Personal history of transient ischemic attack (TIA), and cerebral infarction without residual deficits: Secondary | ICD-10-CM

## 2018-02-17 DIAGNOSIS — I2510 Atherosclerotic heart disease of native coronary artery without angina pectoris: Secondary | ICD-10-CM | POA: Diagnosis present

## 2018-02-17 DIAGNOSIS — D72820 Lymphocytosis (symptomatic): Secondary | ICD-10-CM | POA: Diagnosis not present

## 2018-02-17 DIAGNOSIS — R0602 Shortness of breath: Secondary | ICD-10-CM | POA: Diagnosis not present

## 2018-02-17 DIAGNOSIS — Z9289 Personal history of other medical treatment: Secondary | ICD-10-CM

## 2018-02-17 DIAGNOSIS — Z515 Encounter for palliative care: Secondary | ICD-10-CM | POA: Diagnosis not present

## 2018-02-17 DIAGNOSIS — N320 Bladder-neck obstruction: Secondary | ICD-10-CM | POA: Diagnosis present

## 2018-02-17 DIAGNOSIS — R4020 Unspecified coma: Secondary | ICD-10-CM | POA: Diagnosis not present

## 2018-02-17 DIAGNOSIS — Z4901 Encounter for fitting and adjustment of extracorporeal dialysis catheter: Secondary | ICD-10-CM | POA: Diagnosis not present

## 2018-02-17 HISTORY — DX: Acute on chronic combined systolic (congestive) and diastolic (congestive) heart failure: I50.43

## 2018-02-17 HISTORY — DX: Other chronic pain: G89.29

## 2018-02-17 HISTORY — DX: End stage renal disease: N18.6

## 2018-02-17 HISTORY — DX: Pneumonia, unspecified organism: J18.9

## 2018-02-17 HISTORY — DX: Asymptomatic human immunodeficiency virus (hiv) infection status: Z21

## 2018-02-17 HISTORY — DX: Low back pain: M54.5

## 2018-02-17 HISTORY — DX: Cardiac arrest, cause unspecified: I46.9

## 2018-02-17 HISTORY — DX: Low back pain, unspecified: M54.50

## 2018-02-17 HISTORY — DX: Heart failure, unspecified: I50.9

## 2018-02-17 HISTORY — DX: Human immunodeficiency virus (HIV) disease: B20

## 2018-02-17 HISTORY — DX: Gout, unspecified: M10.9

## 2018-02-17 HISTORY — DX: End stage renal disease: Z99.2

## 2018-02-17 HISTORY — DX: Malignant neoplasm of prostate: C61

## 2018-02-17 LAB — RAPID URINE DRUG SCREEN, HOSP PERFORMED
AMPHETAMINES: NOT DETECTED
BENZODIAZEPINES: NOT DETECTED
Barbiturates: NOT DETECTED
Cocaine: NOT DETECTED
Opiates: NOT DETECTED
TETRAHYDROCANNABINOL: NOT DETECTED

## 2018-02-17 LAB — RESPIRATORY PANEL BY PCR
Adenovirus: NOT DETECTED
BORDETELLA PERTUSSIS-RVPCR: NOT DETECTED
CORONAVIRUS 229E-RVPPCR: NOT DETECTED
CORONAVIRUS HKU1-RVPPCR: NOT DETECTED
Chlamydophila pneumoniae: NOT DETECTED
Coronavirus NL63: NOT DETECTED
Coronavirus OC43: NOT DETECTED
Influenza A: NOT DETECTED
Influenza B: NOT DETECTED
METAPNEUMOVIRUS-RVPPCR: NOT DETECTED
Mycoplasma pneumoniae: NOT DETECTED
PARAINFLUENZA VIRUS 2-RVPPCR: NOT DETECTED
Parainfluenza Virus 1: NOT DETECTED
Parainfluenza Virus 3: NOT DETECTED
Parainfluenza Virus 4: NOT DETECTED
Respiratory Syncytial Virus: NOT DETECTED
Rhinovirus / Enterovirus: NOT DETECTED

## 2018-02-17 LAB — COMPREHENSIVE METABOLIC PANEL
ALBUMIN: 4.1 g/dL (ref 3.5–5.0)
ALK PHOS: 117 U/L (ref 38–126)
ALT: 22 U/L (ref 17–63)
ANION GAP: 16 — AB (ref 5–15)
AST: 27 U/L (ref 15–41)
BUN: 81 mg/dL — AB (ref 6–20)
CALCIUM: 8.8 mg/dL — AB (ref 8.9–10.3)
CO2: 19 mmol/L — AB (ref 22–32)
CREATININE: 5.32 mg/dL — AB (ref 0.61–1.24)
Chloride: 104 mmol/L (ref 101–111)
GFR calc Af Amer: 11 mL/min — ABNORMAL LOW (ref >60)
GFR calc non Af Amer: 9 mL/min — ABNORMAL LOW (ref >60)
GLUCOSE: 157 mg/dL — AB (ref 65–99)
Potassium: 3.8 mmol/L (ref 3.5–5.1)
SODIUM: 139 mmol/L (ref 135–145)
Total Bilirubin: 0.6 mg/dL (ref 0.3–1.2)
Total Protein: 7.9 g/dL (ref 6.5–8.1)

## 2018-02-17 LAB — TROPONIN I
Troponin I: 0.08 ng/mL (ref <0.03)
Troponin I: 0.07 ng/mL (ref <0.03)
Troponin I: 0.07 ng/mL (ref <0.03)

## 2018-02-17 LAB — I-STAT ARTERIAL BLOOD GAS, ED
ACID-BASE DEFICIT: 5 mmol/L — AB (ref 0.0–2.0)
BICARBONATE: 21.6 mmol/L (ref 20.0–28.0)
O2 Saturation: 97 %
PO2 ART: 103 mmHg (ref 83.0–108.0)
Patient temperature: 97.1
TCO2: 23 mmol/L (ref 22–32)
pCO2 arterial: 44.9 mmHg (ref 32.0–48.0)
pH, Arterial: 7.286 — ABNORMAL LOW (ref 7.350–7.450)

## 2018-02-17 LAB — CBC WITH DIFFERENTIAL/PLATELET
BASOS ABS: 0 10*3/uL (ref 0.0–0.1)
Basophils Absolute: 0 10*3/uL (ref 0.0–0.1)
Basophils Relative: 0 %
Basophils Relative: 0 %
EOS ABS: 0.4 10*3/uL (ref 0.0–0.7)
EOS PCT: 0 %
Eosinophils Absolute: 0 10*3/uL (ref 0.0–0.7)
Eosinophils Relative: 4 %
HCT: 41.5 % (ref 39.0–52.0)
HCT: 35.8 % — ABNORMAL LOW (ref 39.0–52.0)
HEMOGLOBIN: 13.5 g/dL (ref 13.0–17.0)
Hemoglobin: 11.4 g/dL — ABNORMAL LOW (ref 13.0–17.0)
LYMPHS PCT: 60 %
Lymphocytes Relative: 7 %
Lymphs Abs: 6.6 10*3/uL — ABNORMAL HIGH (ref 0.7–4.0)
Lymphs Abs: 0.7 10*3/uL (ref 0.7–4.0)
MCH: 33.5 pg (ref 26.0–34.0)
MCH: 31.8 pg (ref 26.0–34.0)
MCHC: 32.5 g/dL (ref 30.0–36.0)
MCHC: 31.8 g/dL (ref 30.0–36.0)
MCV: 103 fL — ABNORMAL HIGH (ref 78.0–100.0)
MCV: 100 fL (ref 78.0–100.0)
MONO ABS: 0.5 10*3/uL (ref 0.1–1.0)
MONOS PCT: 4 %
Monocytes Absolute: 0.4 10*3/uL (ref 0.1–1.0)
Monocytes Relative: 5 %
NEUTROS ABS: 8.5 10*3/uL — AB (ref 1.7–7.7)
NEUTROS PCT: 32 %
Neutro Abs: 3.5 10*3/uL (ref 1.7–7.7)
Neutrophils Relative %: 88 %
Platelets: 256 10*3/uL (ref 150–400)
Platelets: 234 10*3/uL (ref 150–400)
RBC: 4.03 MIL/uL — AB (ref 4.22–5.81)
RBC: 3.58 MIL/uL — ABNORMAL LOW (ref 4.22–5.81)
RDW: 13.7 % (ref 11.5–15.5)
RDW: 13.6 % (ref 11.5–15.5)
WBC: 10.9 10*3/uL — ABNORMAL HIGH (ref 4.0–10.5)
WBC: 9.7 10*3/uL (ref 4.0–10.5)

## 2018-02-17 LAB — GLUCOSE, CAPILLARY
GLUCOSE-CAPILLARY: 95 mg/dL (ref 65–99)
Glucose-Capillary: 110 mg/dL — ABNORMAL HIGH (ref 65–99)
Glucose-Capillary: 114 mg/dL — ABNORMAL HIGH (ref 65–99)
Glucose-Capillary: 115 mg/dL — ABNORMAL HIGH (ref 65–99)

## 2018-02-17 LAB — BASIC METABOLIC PANEL
ANION GAP: 11 (ref 5–15)
BUN: 89 mg/dL — AB (ref 6–20)
CHLORIDE: 102 mmol/L (ref 101–111)
CO2: 24 mmol/L (ref 22–32)
Calcium: 8.2 mg/dL — ABNORMAL LOW (ref 8.9–10.3)
Creatinine, Ser: 5.4 mg/dL — ABNORMAL HIGH (ref 0.61–1.24)
GFR calc Af Amer: 11 mL/min — ABNORMAL LOW (ref >60)
GFR calc non Af Amer: 9 mL/min — ABNORMAL LOW (ref >60)
Glucose, Bld: 147 mg/dL — ABNORMAL HIGH (ref 65–99)
POTASSIUM: 3.3 mmol/L — AB (ref 3.5–5.1)
SODIUM: 137 mmol/L (ref 135–145)

## 2018-02-17 LAB — CBG MONITORING, ED: Glucose-Capillary: 142 mg/dL — ABNORMAL HIGH (ref 65–99)

## 2018-02-17 LAB — I-STAT CHEM 8, ED
BUN: 76 mg/dL — AB (ref 6–20)
CHLORIDE: 104 mmol/L (ref 101–111)
Calcium, Ion: 1.11 mmol/L — ABNORMAL LOW (ref 1.15–1.40)
Creatinine, Ser: 5.3 mg/dL — ABNORMAL HIGH (ref 0.61–1.24)
Glucose, Bld: 150 mg/dL — ABNORMAL HIGH (ref 65–99)
HCT: 44 % (ref 39.0–52.0)
Hemoglobin: 15 g/dL (ref 13.0–17.0)
POTASSIUM: 3.7 mmol/L (ref 3.5–5.1)
SODIUM: 140 mmol/L (ref 135–145)
TCO2: 23 mmol/L (ref 22–32)

## 2018-02-17 LAB — URINALYSIS, ROUTINE W REFLEX MICROSCOPIC
Bilirubin Urine: NEGATIVE
Glucose, UA: NEGATIVE mg/dL
KETONES UR: NEGATIVE mg/dL
Leukocytes, UA: NEGATIVE
Nitrite: NEGATIVE
PH: 5 (ref 5.0–8.0)
Protein, ur: 100 mg/dL — AB
SPECIFIC GRAVITY, URINE: 1.01 (ref 1.005–1.030)
WBC, UA: NONE SEEN WBC/hpf (ref 0–5)

## 2018-02-17 LAB — PROCALCITONIN: Procalcitonin: 0.13 ng/mL

## 2018-02-17 LAB — PROTIME-INR
INR: 0.98
Prothrombin Time: 12.8 seconds (ref 11.4–15.2)

## 2018-02-17 LAB — SALICYLATE LEVEL: Salicylate Lvl: <7 mg/dL (ref 2.8–30.0)

## 2018-02-17 LAB — I-STAT TROPONIN, ED: TROPONIN I, POC: 0.05 ng/mL (ref 0.00–0.08)

## 2018-02-17 LAB — ACETAMINOPHEN LEVEL: Acetaminophen (Tylenol), Serum: <10 ug/mL — ABNORMAL LOW (ref 10–30)

## 2018-02-17 LAB — I-STAT CG4 LACTIC ACID, ED: Lactic Acid, Venous: 4.44 mmol/L (ref 0.5–1.9)

## 2018-02-17 LAB — MRSA PCR SCREENING: MRSA BY PCR: POSITIVE — AB

## 2018-02-17 LAB — LACTIC ACID, PLASMA
LACTIC ACID, VENOUS: 2.2 mmol/L — AB (ref 0.5–1.9)
LACTIC ACID, VENOUS: 1.2 mmol/L (ref 0.5–1.9)

## 2018-02-17 LAB — MAGNESIUM
MAGNESIUM: 1.9 mg/dL (ref 1.7–2.4)
Magnesium: 1.9 mg/dL (ref 1.7–2.4)

## 2018-02-17 LAB — PHOSPHORUS
Phosphorus: 3.9 mg/dL (ref 2.5–4.6)
Phosphorus: 4.1 mg/dL (ref 2.5–4.6)

## 2018-02-17 LAB — LACTATE DEHYDROGENASE: LDH: 184 U/L (ref 98–192)

## 2018-02-17 LAB — ETHANOL: Alcohol, Ethyl (B): <10 mg/dL (ref <10)

## 2018-02-17 LAB — BRAIN NATRIURETIC PEPTIDE: B Natriuretic Peptide: 800.9 pg/mL — ABNORMAL HIGH (ref 0.0–100.0)

## 2018-02-17 LAB — TRIGLYCERIDES: Triglycerides: 82 mg/dL (ref <150)

## 2018-02-17 MED ORDER — CARVEDILOL 12.5 MG PO TABS
12.5000 mg | ORAL_TABLET | Freq: Two times a day (BID) | ORAL | Status: DC
  Administered 2018-02-17: 12.5 mg via ORAL
  Filled 2018-02-17: qty 1

## 2018-02-17 MED ORDER — PRO-STAT SUGAR FREE PO LIQD
30.0000 mL | Freq: Two times a day (BID) | ORAL | Status: DC
  Administered 2018-02-17 – 2018-02-19 (×5): 30 mL
  Filled 2018-02-17 (×5): qty 30

## 2018-02-17 MED ORDER — VITAL HIGH PROTEIN PO LIQD
1000.0000 mL | ORAL | Status: DC
  Administered 2018-02-17 – 2018-02-18 (×2): 1000 mL

## 2018-02-17 MED ORDER — FUROSEMIDE 10 MG/ML IJ SOLN
INTRAMUSCULAR | Status: AC
  Filled 2018-02-17: qty 12

## 2018-02-17 MED ORDER — EMTRICITABINE-TENOFOVIR AF 200-25 MG PO TABS
1.0000 | ORAL_TABLET | Freq: Every day | ORAL | Status: DC
  Administered 2018-02-17 – 2018-03-08 (×20): 1 via ORAL
  Filled 2018-02-17 (×21): qty 1

## 2018-02-17 MED ORDER — FUROSEMIDE 10 MG/ML IJ SOLN
100.0000 mg | Freq: Once | INTRAVENOUS | Status: AC
  Administered 2018-02-17: 100 mg via INTRAVENOUS
  Filled 2018-02-17: qty 10

## 2018-02-17 MED ORDER — MIDAZOLAM HCL 2 MG/2ML IJ SOLN: 1.0000 mg | INTRAMUSCULAR | Status: DC | PRN

## 2018-02-17 MED ORDER — ARFORMOTEROL TARTRATE 15 MCG/2ML IN NEBU: 15.0000 ug | INHALATION_SOLUTION | Freq: Two times a day (BID) | RESPIRATORY_TRACT | Status: DC

## 2018-02-17 MED ORDER — FENTANYL CITRATE (PF) 100 MCG/2ML IJ SOLN
50.0000 ug | Freq: Once | INTRAMUSCULAR | Status: AC
  Administered 2018-02-17: 50 ug via INTRAVENOUS

## 2018-02-17 MED ORDER — DOLUTEGRAVIR SODIUM 50 MG PO TABS
50.0000 mg | ORAL_TABLET | Freq: Every day | ORAL | Status: DC
  Administered 2018-02-17 – 2018-03-08 (×20): 50 mg via ORAL
  Filled 2018-02-17 (×24): qty 1

## 2018-02-17 MED ORDER — PROPOFOL 1000 MG/100ML IV EMUL
INTRAVENOUS | Status: AC
  Filled 2018-02-17: qty 100

## 2018-02-17 MED ORDER — BUMETANIDE 2 MG PO TABS
4.0000 mg | ORAL_TABLET | Freq: Once | ORAL | Status: DC
  Filled 2018-02-17: qty 2

## 2018-02-17 MED ORDER — HEPARIN SODIUM (PORCINE) 5000 UNIT/ML IJ SOLN
5000.0000 [IU] | Freq: Three times a day (TID) | INTRAMUSCULAR | Status: AC
  Administered 2018-02-17 (×2): 5000 [IU] via SUBCUTANEOUS
  Filled 2018-02-17 (×2): qty 1

## 2018-02-17 MED ORDER — PANTOPRAZOLE SODIUM 40 MG IV SOLR
40.0000 mg | Freq: Every day | INTRAVENOUS | Status: DC
  Administered 2018-02-17 – 2018-02-25 (×9): 40 mg via INTRAVENOUS
  Filled 2018-02-17 (×9): qty 40

## 2018-02-17 MED ORDER — INSULIN ASPART 100 UNIT/ML ~~LOC~~ SOLN: 2.0000 [IU] | SUBCUTANEOUS | Status: DC

## 2018-02-17 MED ORDER — ORAL CARE MOUTH RINSE
15.0000 mL | Freq: Four times a day (QID) | OROMUCOSAL | Status: DC
  Administered 2018-02-17 – 2018-02-19 (×10): 15 mL via OROMUCOSAL

## 2018-02-17 MED ORDER — SODIUM CHLORIDE 0.9 % IV SOLN: 250.0000 mL | INTRAVENOUS | Status: DC | PRN

## 2018-02-17 MED ORDER — FUROSEMIDE 10 MG/ML IJ SOLN
160.0000 mg | Freq: Four times a day (QID) | INTRAVENOUS | Status: DC
  Administered 2018-02-17 – 2018-02-19 (×8): 160 mg via INTRAVENOUS
  Filled 2018-02-17: qty 10
  Filled 2018-02-17 (×4): qty 16
  Filled 2018-02-17: qty 10
  Filled 2018-02-17 (×4): qty 16
  Filled 2018-02-17: qty 10

## 2018-02-17 MED ORDER — MUPIROCIN 2 % EX OINT
TOPICAL_OINTMENT | Freq: Two times a day (BID) | CUTANEOUS | Status: DC
  Administered 2018-02-17 – 2018-02-23 (×13): via NASAL
  Administered 2018-02-24: 1 via NASAL
  Administered 2018-02-24: 22:00:00 via NASAL
  Filled 2018-02-17 (×4): qty 22

## 2018-02-17 MED ORDER — BUDESONIDE 0.5 MG/2ML IN SUSP: 0.5000 mg | Freq: Two times a day (BID) | RESPIRATORY_TRACT | Status: DC

## 2018-02-17 MED ORDER — NITROGLYCERIN IN D5W 200-5 MCG/ML-% IV SOLN
INTRAVENOUS | Status: AC
  Filled 2018-02-17: qty 250

## 2018-02-17 MED ORDER — SCOPOLAMINE 1 MG/3DAYS TD PT72
1.0000 | MEDICATED_PATCH | TRANSDERMAL | Status: DC
  Administered 2018-02-17 – 2018-03-07 (×6): 1.5 mg via TRANSDERMAL
  Filled 2018-02-17 (×8): qty 1

## 2018-02-17 MED ORDER — CHLORHEXIDINE GLUCONATE 0.12% ORAL RINSE (MEDLINE KIT)
15.0000 mL | Freq: Two times a day (BID) | OROMUCOSAL | Status: DC
  Administered 2018-02-17 – 2018-02-19 (×5): 15 mL via OROMUCOSAL

## 2018-02-17 MED ORDER — PROPOFOL 1000 MG/100ML IV EMUL
5.0000 ug/kg/min | Freq: Once | INTRAVENOUS | Status: DC
  Administered 2018-02-17: 10 ug/kg/min via INTRAVENOUS

## 2018-02-17 MED ORDER — FUROSEMIDE 10 MG/ML IJ SOLN
80.0000 mg | Freq: Once | INTRAMUSCULAR | Status: DC
  Filled 2018-02-17: qty 8

## 2018-02-17 MED ORDER — NITROGLYCERIN IN D5W 200-5 MCG/ML-% IV SOLN
0.0000 ug/min | Freq: Once | INTRAVENOUS | Status: AC
  Administered 2018-02-17: 5 ug/min via INTRAVENOUS

## 2018-02-17 MED ORDER — PROPOFOL 1000 MG/100ML IV EMUL
5.0000 ug/kg/min | INTRAVENOUS | Status: DC
  Administered 2018-02-17 – 2018-02-19 (×7): 20 ug/kg/min via INTRAVENOUS
  Filled 2018-02-17 (×7): qty 100

## 2018-02-17 MED ORDER — FENTANYL 2500MCG IN NS 250ML (10MCG/ML) PREMIX INFUSION
25.0000 ug/h | INTRAVENOUS | Status: DC
  Administered 2018-02-17: 50 ug/h via INTRAVENOUS
  Administered 2018-02-18: 75 ug/h via INTRAVENOUS
  Filled 2018-02-17 (×2): qty 250

## 2018-02-17 MED ORDER — CARVEDILOL 6.25 MG PO TABS
6.2500 mg | ORAL_TABLET | Freq: Two times a day (BID) | ORAL | Status: DC
  Administered 2018-02-18: 6.25 mg via ORAL
  Filled 2018-02-17 (×2): qty 1

## 2018-02-17 MED ORDER — FENTANYL BOLUS VIA INFUSION
25.0000 ug | INTRAVENOUS | Status: DC | PRN
  Filled 2018-02-17: qty 25

## 2018-02-17 MED ORDER — METOLAZONE 5 MG PO TABS
5.0000 mg | ORAL_TABLET | Freq: Once | ORAL | Status: AC
  Administered 2018-02-17: 5 mg
  Filled 2018-02-17 (×2): qty 1

## 2018-02-17 NOTE — ED Notes (Signed)
Attempted to place temp foley without success, condom cath applied

## 2018-02-17 NOTE — H&P (Signed)
PULMONARY / CRITICAL CARE MEDICINE   Name: David [name] MRN: [id number] DOB: 04-04-1941    ADMISSION DATE:  02/17/2018 CONSULTATION DATE:  02/17/2018  REFERRING MD:  Dr. 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 Wife reports that patient has had a dry cough last several days however, other than that has been in usual state on health. This morning around 2-3 am patient woke up with severe shortness of breath asking to be taken to ED. Upon arrival to ED patient was noted to be agonal and pulseless in parking lot. CPR started and continued for estimated 5-10 minutes before return of ROSC. Intubated. CXR consistent with pulmonary edema. Creatinine 5.32, LA 4.44. PCCM asked to admit.   PAST MEDICAL HISTORY :  He  has no past medical history on file.  PAST SURGICAL HISTORY: He  has no past surgical history on file.  No Known Allergies  No current facility-administered medications on file prior to encounter.    No current outpatient medications on file prior to encounter.    FAMILY HISTORY:  His has no family status information on file.    SOCIAL HISTORY: He    REVIEW OF SYSTEMS:   Unable to review as patient is intubated and sedated   SUBJECTIVE:   VITAL SIGNS: BP 103/74   Pulse 72   Resp 19   Ht 6\' 2"  (1.88 m)   SpO2 100%   HEMODYNAMICS:    VENTILATOR SETTINGS: Vent Mode: PRVC FiO2 (%):  [100 %] 100 % Set Rate:  [20 bmp] 20 bmp Vt Set:  [220 mL] 660 mL PEEP:  [5 cmH20] 5 cmH20 Plateau Pressure:  [29 cmH20] 29 cmH20  INTAKE / OUTPUT: No intake/output data recorded.  PHYSICAL EXAMINATION: General:  Adult male on vent  Neuro:  Sedated, opens eyes and tracks to verbal stimulation, pupils  intact  HEENT:  ETT in place  Cardiovascular:  RRR, no MRG  Lungs:  Diminished breath sounds, no wheeze Abdomen:  Active bowel sounds, non-distended  Musculoskeletal:  +1 pedal edema  Skin:  Warm, dry   LABS:  BMET Recent Labs  Lab 02/17/18 0300 02/17/18 0312  NA 139 140  K 3.8 3.7  CL 104 104  CO2 19*  --   BUN 81* 76*  CREATININE 5.32* 5.30*  GLUCOSE 157* 150*    Electrolytes Recent Labs  Lab 02/17/18 0300  CALCIUM 8.8*    CBC Recent Labs  Lab 02/17/18 0300 02/17/18 0312  WBC 10.9*  --   HGB 13.5 15.0  HCT 41.5 44.0  PLT 256  --     Coag's Recent Labs  Lab 02/17/18 0300  INR 0.98    Sepsis Markers Recent Labs  Lab 02/17/18 0311  LATICACIDVEN 4.44*    ABG Recent Labs  Lab 02/17/18 0413  PHART 7.286*  PCO2ART 44.9  PO2ART 103.0    Liver Enzymes Recent Labs  Lab 02/17/18 0300  AST 27  ALT 22  ALKPHOS 117  BILITOT 0.6  ALBUMIN 4.1    Cardiac Enzymes No results for input(s): TROPONINI, PROBNP in the last 168 hours.  Glucose Recent Labs  Lab 02/17/18 0306  GLUCAP 142*    Imaging Dg Chest Portable 1 View  Result Date: 02/17/2018 CLINICAL  DATA:  Shortness of breath. Intubation and orogastric tube placement. EXAM: PORTABLE CHEST 1 VIEW COMPARISON:  None. FINDINGS: Endotracheal tube 7.4 cm from the carina. Enteric tube in place, tip visualized in the stomach. The heart is enlarged. There are bilateral ill-defined perihilar opacities. Minimal Kerley B-lines in the periphery of the right lung. No large pleural effusion or evidence of pneumothorax. Lung apices not included in the field of view. IMPRESSION: 1. Endotracheal tube 7.4 cm from the carina. Enteric tube in place with tip visualized in the stomach. 2. Cardiomegaly. Bilateral ill-defined perihilar opacities, favoring pulmonary edema. Multifocal pneumonia or aspiration could have a similar appearance. Electronically Signed   By: Jeb Levering M.D.   On: 02/17/2018 03:45      STUDIES:  CXR 3/17 > 1. Endotracheal tube 7.4 cm from the carina. Enteric tube in place with tip visualized in the stomach. 2. Cardiomegaly. Bilateral ill-defined perihilar opacities, favoring pulmonary edema. Multifocal pneumonia or aspiration could have a similar appearance. CT Chest 3/17 >> CT Head 3/17 >> ECHO 3/17 >>   CULTURES: Blood 3/17 >> Sputum 3/17 >> U/A 3/17 >>  ANTIBIOTICS: None   SIGNIFICANT EVENTS: 3/17 > Presents to ED   LINES/TUBES: ETT 3/17 >>  DISCUSSION: 77 y.o.malewith severe and sudden shortness of breath presents to ED on 3/17 agonal and pulseless for 5-10 minutes before return of ROSC. Intubated. PCCM asked to admit.   ASSESSMENT / PLAN:  PULMONARY A: Acute Hypoxic Respiratory Failure in setting of suspected decompensated heart failure, +/-viral illness  CXR with cardiomegaly and perihilar opacities concerning for pulmonary edema  P:   Vent Support Trend ABG/CXR Pulmonary Hygiene  RVP pending  CT Chest Pending   CARDIOVASCULAR A:  PEA/Aystole Arrest 5-10 minutes  Decompensated Heart Failure  Nonischemic cardiomyopathy with EF 16-10% and diastolic dysfunction H/O HTN, HLD P:  Cardiac Monitoring  Maintain MAP >65  ECHO pending Trend Troponin   Concern and risk for PE > ECHO and LE Dopplers pending   RENAL A:   Acute on Chronic Kidney Disease (Baseline Crt 4.6-4.9)  Crt 5.32 >  -Followed by Dr. Posey Pronto in office, plans for AVF placement  -Given 100 meq Lasix in ED  Anion Gap Metabolic Acidosis, Lactic Acidosis suspected secondary to hypoperfusion  P:   Trend BMP  Replace electrolytes as indicated  Trend LA  Attempting Bumex and Metolazone  Consider Nephrology consult in AM   GASTROINTESTINAL A:   SUP  P:   NPO  PPI   HEMATOLOGIC A:   Microcytic Anemia  HIV/AIDS Prostate Cancer s/p brachytherapy P:  Trend CBC  Maintain Hbg >7   INFECTIOUS A:   Afebrile, WBC 10.9  P:   Trend WBC and Fever Curve Trend LA  and PCT PAN Culture  Monitor off antibiotics   ENDOCRINE A:   Hyperglycemia    P:   Trend Glucose  SSI   NEUROLOGIC A:   Sedation needs -Tylenol and Salicylate level negative  H/O CVA P:   RASS goal: 0/-1 Wean Fentanyl gtt to achieve RASS PRN versed  CT Head pending    FAMILY  - Updates: Family updated at bedside   - Inter-disciplinary family meet or Palliative Care meeting due by:  02/24/2018    Hayden Pedro, AGACNP-BC [facility]  Pgr: [phone]  PCCM Pgr: [phone]

## 2018-02-17 NOTE — Consult Note (Signed)
[facility] Renal Consultation Note  Requesting MD: McQuaid Indication for Consultation: A on CRF  HPI:  David [name] is a 77 y.o. male with controlled HIV- history of prostate CA s/p brachytherapy, CHF- EF 20-25%.  He also has stage 5 CKD followed by Dr. Posey Pronto at [facility].  Has had 2 failed accesses- crt seems to have been between 4.5 and 5 for several months.  He had a hospitalization in December for PNA then in February for pulm edema- both times did not require dialysis.  He presented to hospital early this AM with SOB- was driven to the hospital- upon arrival was pulseless.  Coded for less than 10 minutes- intubated, then admitted to [facility].  CXR favoring pul edema vs multifocal PNA.  BP is soft on sedation - has had no UOP.  Apparently unable to place foley in ER- had nursing do bladder scan - more than 400. Has been given bumex, IV lasix at 100 and metolazone    Creatinine, Ser  Date/Time Value Ref Range Status  02/17/2018 03:12 AM 5.30 (H) 0.61 - 1.24 mg/dL Final  02/17/2018 03:00 AM 5.32 (H) 0.61 - 1.24 mg/dL Final     PMHx:  No past medical history on file.    Family Hx: No family history on file.  Social History:  has no tobacco, alcohol, and drug history on file.  Allergies: No Known Allergies  Medications: Prior to Admission medications   Not on File    I have reviewed the patient's current medications.  Labs:  Results for orders placed or performed during the hospital encounter of 02/17/18 (from the past 48 hour(s))  CBC with Differential/Platelet     Status: Abnormal   Collection Time: 02/17/18  3:00 AM  Result Value Ref Range   WBC 10.9 (H) 4.0 - 10.5 K/uL   RBC 4.03 (L) 4.22 - 5.81 MIL/uL   Hemoglobin 13.5 13.0 - 17.0 g/dL   HCT 41.5 39.0 - 52.0 %   MCV 103.0 (H) 78.0 - 100.0 fL   MCH 33.5 26.0 - 34.0 pg   MCHC 32.5 30.0 - 36.0 g/dL   RDW 13.7 11.5 - 15.5 %   Platelets 256 150 - 400 K/uL   Neutrophils Relative % 32 %   Lymphocytes  Relative 60 %   Monocytes Relative 4 %   Eosinophils Relative 4 %   Basophils Relative 0 %   Neutro Abs 3.5 1.7 - 7.7 K/uL   Lymphs Abs 6.6 (H) 0.7 - 4.0 K/uL   Monocytes Absolute 0.4 0.1 - 1.0 K/uL   Eosinophils Absolute 0.4 0.0 - 0.7 K/uL   Basophils Absolute 0.0 0.0 - 0.1 K/uL   WBC Morphology ABSOLUTE LYMPHOCYTOSIS     Comment: Performed at [facility], [address] [place], [place] [address]  Comprehensive metabolic panel     Status: Abnormal   Collection Time: 02/17/18  3:00 AM  Result Value Ref Range   Sodium 139 135 - 145 mmol/L   Potassium 3.8 3.5 - 5.1 mmol/L   Chloride 104 101 - 111 mmol/L   CO2 19 (L) 22 - 32 mmol/L   Glucose, Bld 157 (H) 65 - 99 mg/dL   BUN 81 (H) 6 - 20 mg/dL   Creatinine, Ser 5.32 (H) 0.61 - 1.24 mg/dL   Calcium 8.8 (L) 8.9 - 10.3 mg/dL   Total Protein 7.9 6.5 - 8.1 g/dL   Albumin 4.1 3.5 - 5.0 g/dL   AST 27 15 - 41  U/L   ALT 22 17 - 63 U/L   Alkaline Phosphatase 117 38 - 126 U/L   Total Bilirubin 0.6 0.3 - 1.2 mg/dL   GFR calc non Af Amer 9 (L) >60 mL/min   GFR calc Af Amer 11 (L) >60 mL/min    Comment: (NOTE) The eGFR has been calculated using the CKD EPI equation. This calculation has not been validated in all clinical situations. eGFR's persistently <60 mL/min signify possible Chronic Kidney Disease.    Anion gap 16 (H) 5 - 15    Comment: Performed at [facility], [place] [address] [place], [place] [address]  Brain natriuretic peptide     Status: Abnormal   Collection Time: 02/17/18  3:00 AM  Result Value Ref Range   B Natriuretic Peptide 800.9 (H) 0.0 - 100.0 pg/mL    Comment: Performed at [facility] [address] [place], [place] [address]  Protime-INR     Status: None   Collection Time: 02/17/18  3:00 AM  Result Value Ref Range   Prothrombin Time 12.8 11.4 - 15.2 seconds   INR 0.98     Comment: Performed at [facility] [address] [place], [place] [address]  Acetaminophen level     Status:  Abnormal   Collection Time: 02/17/18  3:00 AM  Result Value Ref Range   Acetaminophen (Tylenol), Serum <10 (L) 10 - 30 ug/mL    Comment:        THERAPEUTIC CONCENTRATIONS VARY SIGNIFICANTLY. A RANGE OF 10-30 ug/mL MAY BE AN EFFECTIVE CONCENTRATION FOR MANY PATIENTS. HOWEVER, SOME ARE BEST TREATED AT CONCENTRATIONS OUTSIDE THIS RANGE. ACETAMINOPHEN CONCENTRATIONS >150 ug/mL AT 4 HOURS AFTER INGESTION AND >50 ug/mL AT 12 HOURS AFTER INGESTION ARE OFTEN ASSOCIATED WITH TOXIC REACTIONS. Performed at [facility], [place] [address], [place], [place] [address]   Salicylate level     Status: None   Collection Time: 02/17/18  3:00 AM  Result Value Ref Range   Salicylate Lvl <6.9 2.8 - 30.0 mg/dL    Comment: Performed at [facility] [address], [place], [place] [address]  CBG monitoring, ED     Status: Abnormal   Collection Time: 02/17/18  3:06 AM  Result Value Ref Range   Glucose-Capillary 142 (H) 65 - 99 mg/dL  I-stat troponin, ED     Status: None   Collection Time: 02/17/18  3:09 AM  Result Value Ref Range   Troponin i, poc 0.05 0.00 - 0.08 ng/mL   Comment 3            Comment: Due to the release kinetics of cTnI, a negative result within the first hours of the onset of symptoms does not rule out myocardial infarction with certainty. If myocardial infarction is still suspected, repeat the test at appropriate intervals.   I-Stat CG4 Lactic Acid, ED     Status: Abnormal   Collection Time: 02/17/18  3:11 AM  Result Value Ref Range   Lactic Acid, Venous 4.44 (HH) 0.5 - 1.9 mmol/L   Comment NOTIFIED PHYSICIAN   I-stat chem 8, ed     Status: Abnormal   Collection Time: 02/17/18  3:12 AM  Result Value Ref Range   Sodium 140 135 - 145 mmol/L   Potassium 3.7 3.5 - 5.1 mmol/L   Chloride 104 101 - 111 mmol/L   BUN 76 (H) 6 - 20 mg/dL   Creatinine, Ser 5.30 (H) 0.61 - 1.24 mg/dL  Glucose, Bld 150 (H) 65 - 99 mg/dL   Calcium, Ion 1.11 (L) 1.15 - 1.40 mmol/L   TCO2  23 22 - 32 mmol/L   Hemoglobin 15.0 13.0 - 17.0 g/dL   HCT 44.0 39.0 - 52.0 %  I-Stat Arterial Blood Gas, ED - (order at [facility] and [facility] only)     Status: Abnormal   Collection Time: 02/17/18  4:13 AM  Result Value Ref Range   pH, Arterial 7.286 (L) 7.350 - 7.450   pCO2 arterial 44.9 32.0 - 48.0 mmHg   pO2, Arterial 103.0 83.0 - 108.0 mmHg   Bicarbonate 21.6 20.0 - 28.0 mmol/L   TCO2 23 22 - 32 mmol/L   O2 Saturation 97.0 %   Acid-base deficit 5.0 (H) 0.0 - 2.0 mmol/L   Patient temperature 97.1 F    Collection site RADIAL, ALLEN'S TEST ACCEPTABLE    Drawn by Operator    Sample type ARTERIAL   Procalcitonin - Baseline     Status: None   Collection Time: 02/17/18  4:27 AM  Result Value Ref Range   Procalcitonin 0.13 ng/mL    Comment:        Interpretation: PCT (Procalcitonin) <= 0.5 ng/mL: Systemic infection (sepsis) is not likely. Local bacterial infection is possible. (NOTE)       Sepsis PCT Algorithm           Lower Respiratory Tract                                      Infection PCT Algorithm    ----------------------------     ----------------------------         PCT < 0.25 ng/mL                PCT < 0.10 ng/mL         Strongly encourage             Strongly discourage   discontinuation of antibiotics    initiation of antibiotics    ----------------------------     -----------------------------       PCT 0.25 - 0.50 ng/mL            PCT 0.10 - 0.25 ng/mL               OR       >80% decrease in PCT            Discourage initiation of                                            antibiotics      Encourage discontinuation           of antibiotics    ----------------------------     -----------------------------         PCT >= 0.50 ng/mL              PCT 0.26 - 0.50 ng/mL               AND        <80% decrease in PCT             Encourage initiation of  antibiotics       Encourage continuation           of antibiotics     ----------------------------     -----------------------------        PCT >= 0.50 ng/mL                  PCT > 0.50 ng/mL               AND         increase in PCT                  Strongly encourage                                      initiation of antibiotics    Strongly encourage escalation           of antibiotics                                     -----------------------------                                           PCT <= 0.25 ng/mL                                                 OR                                        > 80% decrease in PCT                                     Discontinue / Do not initiate                                             antibiotics Performed at [facility], [address], [place], [place] [address]   Lactic acid, plasma     Status: Abnormal   Collection Time: 02/17/18  4:27 AM  Result Value Ref Range   Lactic Acid, Venous 2.2 (HH) 0.5 - 1.9 mmol/L    Comment: CRITICAL RESULT CALLED TO, READ BACK BY AND VERIFIED WITH: STOWE [facility] 02/17/18 0524 WAYK Performed at [facility], [place] [address] [place], [place] [address]   Troponin I (q 6hr x 3)     Status: Abnormal   Collection Time: 02/17/18  4:55 AM  Result Value Ref Range   Troponin I 0.08 (HH) <0.03 ng/mL    Comment: CRITICAL RESULT CALLED TO, READ BACK BY AND VERIFIED WITH: STOWE S,RN 02/17/18 0533 WAYK Performed at [facility], [place] [address] [place], [place] [address]   Urine rapid drug screen (hosp performed)     Status: None   Collection Time: 02/17/18  5:40 AM  Result Value  Ref Range   Opiates NONE DETECTED NONE DETECTED   Cocaine NONE DETECTED NONE DETECTED   Benzodiazepines NONE DETECTED NONE DETECTED   Amphetamines NONE DETECTED NONE DETECTED   Tetrahydrocannabinol NONE DETECTED NONE DETECTED   Barbiturates NONE DETECTED NONE DETECTED    Comment: (NOTE) DRUG SCREEN FOR MEDICAL PURPOSES ONLY.  IF CONFIRMATION IS NEEDED FOR ANY PURPOSE, NOTIFY LAB WITHIN  5 DAYS. LOWEST DETECTABLE LIMITS FOR URINE DRUG SCREEN Drug Class                     Cutoff (ng/mL) Amphetamine and metabolites    1000 Barbiturate and metabolites    200 Benzodiazepine                 097 Tricyclics and metabolites     300 Opiates and metabolites        300 Cocaine and metabolites        300 THC                            50 Performed at [facility], [place] [address], [place], [place] [address]   Urinalysis, Routine w reflex microscopic     Status: Abnormal   Collection Time: 02/17/18  5:40 AM  Result Value Ref Range   Color, Urine YELLOW YELLOW   APPearance CLOUDY (A) CLEAR   Specific Gravity, Urine 1.010 1.005 - 1.030   pH 5.0 5.0 - 8.0   Glucose, UA NEGATIVE NEGATIVE mg/dL   Hgb urine dipstick LARGE (A) NEGATIVE   Bilirubin Urine NEGATIVE NEGATIVE   Ketones, ur NEGATIVE NEGATIVE mg/dL   Protein, ur 100 (A) NEGATIVE mg/dL   Nitrite NEGATIVE NEGATIVE   Leukocytes, UA NEGATIVE NEGATIVE   RBC / HPF TOO NUMEROUS TO COUNT 0 - 5 RBC/hpf   WBC, UA NONE SEEN 0 - 5 WBC/hpf   Bacteria, UA RARE (A) NONE SEEN   Squamous Epithelial / LPF 0-5 (A) NONE SEEN   WBC Clumps PRESENT    Hyaline Casts, UA PRESENT     Comment: Performed at [facility], [place] [address], [place], [place] [address]  Respiratory Panel by PCR     Status: None   Collection Time: 02/17/18  5:40 AM  Result Value Ref Range   Adenovirus NOT DETECTED NOT DETECTED   Coronavirus 229E NOT DETECTED NOT DETECTED   Coronavirus HKU1 NOT DETECTED NOT DETECTED   Coronavirus NL63 NOT DETECTED NOT DETECTED   Coronavirus OC43 NOT DETECTED NOT DETECTED   Metapneumovirus NOT DETECTED NOT DETECTED   Rhinovirus / Enterovirus NOT DETECTED NOT DETECTED   Influenza A NOT DETECTED NOT DETECTED   Influenza B NOT DETECTED NOT DETECTED   Parainfluenza Virus 1 NOT DETECTED NOT DETECTED   Parainfluenza Virus 2 NOT DETECTED NOT DETECTED   Parainfluenza Virus 3 NOT DETECTED NOT DETECTED   Parainfluenza Virus 4  NOT DETECTED NOT DETECTED   Respiratory Syncytial Virus NOT DETECTED NOT DETECTED   Bordetella pertussis NOT DETECTED NOT DETECTED   Chlamydophila pneumoniae NOT DETECTED NOT DETECTED   Mycoplasma pneumoniae NOT DETECTED NOT DETECTED    Comment: Performed at [facility] [address], [place], [place] [address]  MRSA PCR Screening     Status: Abnormal   Collection Time: 02/17/18  5:40 AM  Result Value Ref Range   MRSA by PCR POSITIVE (A) NEGATIVE    Comment:  The GeneXpert MRSA Assay (FDA approved for NASAL specimens only), is one component of a comprehensive MRSA colonization surveillance program. It is not intended to diagnose MRSA infection nor to guide or monitor treatment for MRSA infections. RESULT CALLED TO, READ BACK BY AND VERIFIED WITH: KEISHA @ 0488 ON 02/17/18 BY ROBINSON Z.   Culture, blood (routine x 2)     Status: None (Preliminary result)   Collection Time: 02/17/18  6:09 AM  Result Value Ref Range   Specimen Description BLOOD LEFT HAND    Special Requests      BOTTLES DRAWN AEROBIC ONLY Blood Culture results may not be optimal due to an inadequate volume of blood received in culture bottles Performed at [facility] [address], [place], [place] [address]    Culture PENDING    Report Status PENDING   Ethanol     Status: None   Collection Time: 02/17/18  6:09 AM  Result Value Ref Range   Alcohol, Ethyl (B) <10 <10 mg/dL    Comment:        LOWEST DETECTABLE LIMIT FOR SERUM ALCOHOL IS 10 mg/dL FOR MEDICAL PURPOSES ONLY Performed at [facility], [place] [address], [place], [place] [address]   Glucose, capillary     Status: Abnormal   Collection Time: 02/17/18  6:56 AM  Result Value Ref Range   Glucose-Capillary 110 (H) 65 - 99 mg/dL   Comment 1 Capillary Specimen    Comment 2 Notify RN   Lactic acid, plasma     Status: None   Collection Time: 02/17/18 10:20 AM  Result Value Ref Range   Lactic Acid, Venous 1.2 0.5 - 1.9  mmol/L    Comment: Performed at [facility], [place] [address] [place], [place] [address]  Lactate dehydrogenase     Status: None   Collection Time: 02/17/18 10:20 AM  Result Value Ref Range   LDH 184 98 - 192 U/L    Comment: Performed at [facility], [place] [address] [place], [place] [address]  CBC with Differential/Platelet     Status: Abnormal (Preliminary result)   Collection Time: 02/17/18 10:20 AM  Result Value Ref Range   WBC 9.7 4.0 - 10.5 K/uL    Comment: WHITE COUNT CONFIRMED ON SMEAR   RBC 3.58 (L) 4.22 - 5.81 MIL/uL   Hemoglobin 11.4 (L) 13.0 - 17.0 g/dL    Comment: REPEATED TO VERIFY DELTA CHECK NOTED    HCT 35.8 (L) 39.0 - 52.0 %   MCV 100.0 78.0 - 100.0 fL   MCH 31.8 26.0 - 34.0 pg   MCHC 31.8 30.0 - 36.0 g/dL   RDW 13.6 11.5 - 15.5 %   Platelets 234 150 - 400 K/uL    Comment: Performed at [facility], [place] [address], [place], [place] [address]   Neutrophils Relative % PENDING %   Neutro Abs PENDING 1.7 - 7.7 K/uL   Band Neutrophils PENDING %   Lymphocytes Relative PENDING %   Lymphs Abs PENDING 0.7 - 4.0 K/uL   Monocytes Relative PENDING %   Monocytes Absolute PENDING 0.1 - 1.0 K/uL   Eosinophils Relative PENDING %   Eosinophils Absolute PENDING 0.0 - 0.7 K/uL   Basophils Relative PENDING %   Basophils Absolute PENDING 0.0 - 0.1 K/uL   WBC Morphology PENDING    RBC Morphology PENDING    Smear Review PENDING    nRBC PENDING 0 /100 WBC   Metamyelocytes  Relative PENDING %   Myelocytes PENDING %   Promyelocytes Absolute PENDING %   Blasts PENDING %  Magnesium     Status: None   Collection Time: 02/17/18 10:20 AM  Result Value Ref Range   Magnesium 1.9 1.7 - 2.4 mg/dL    Comment: Performed at [facility] [address], [place], [place] [address]  Phosphorus     Status: None   Collection Time: 02/17/18 10:20 AM  Result Value Ref Range   Phosphorus 3.9 2.5 - 4.6 mg/dL    Comment: Performed at [facility] [address], [place], [place] [address]     ROS:  Review of systems not obtained due to patient factors.  Physical Exam: Vitals:   02/17/18 1000 02/17/18 1100  BP: 106/71 94/65  Pulse: 77 74  Resp: 20 20  Temp:    SpO2: 100% 100%     General: sedated on vent- foaming at the mouth HEENT: PERRLA, EOMI, mucous membranes moist Neck: no JVD Heart: RRR Lungs: CBS bilat- FIO2 down to 60% Abdomen: soft, non tender, non distended  Extremities: no peripheral edema noted Skin:warm and dry  Neuro: sedated - fentanyl and versed   Assessment/Plan: 77 year old BM with controlled HIV, EF 20-25 % as well as stage 5 CKD.  This is third hosp in 3 mos for pulmonary edema/pulmonary issues  1.Renal- stage 5 CKD given apparent decline over last several months- 3 hosps and now cardiac arrest it may be the better part of valor to go ahead and initiate dialysis this admission.  Right now it appears that he is not making urine but there may be a bladder obstruction issue- nursing attempting to place foley right now with success- very little return of urine. There are no absolute emergent indications right now given that hypoxia is being treated with vent .  What I will try to do today is challenge him more with high dose lasix but look to initiate HD tomorrow after I can get a tunneled HD cath placed by IR, thus avoiding the temp cath phase.  I have talked to pts wife and she is agreeable to get tunneled HD catheter tomorrow and initiate chronic HD.  He has 2 failed fistulas as OP so we need to address if perm access will be possible in him  2. Hypertension/volume  - BPs soft on coreg and sedation- will dec dose of coreg in preparation for HD tomorrow to pull volume  3. Anemia  - not significant issue yet, no meds needed 4. Bones- phos is fine- no binders needed- check PTH in AM    David [name] A 02/17/2018, 11:28 AM

## 2018-02-17 NOTE — ED Notes (Signed)
Per wife, pt was feeling well yesterday, woke up this morning with sudden onset sob. On arrival, pt found to be agonal and pulseless in the car. CPR started by tech first, pulses present by the time pt was brought back to Trauma B; initial heart rate in the 40s, which improved after intubation

## 2018-02-17 NOTE — Progress Notes (Signed)
[address] Progress Note Patient Name: David [name] DOB: [birth year] MRN: [id number]   Date of Service  02/17/2018  HPI/Events of Note  K+ = 3.3 and Creatinine = 5.4.  eICU Interventions  Will not replace K+ at this time to renal failure.      Intervention Category Major Interventions: Electrolyte abnormality - evaluation and management  Sommer,Steven Eugene 02/17/2018, 11:52 PM

## 2018-02-17 NOTE — ED Notes (Signed)
No addl blood draw,  Pt enroute to CT

## 2018-02-17 NOTE — ED Notes (Signed)
Pt arrived to ER with wife per POV; pt found unresponsive in front seat of car by lobby staff with white colored phlem from mouth; pt agonally breathing and skin color cyanotic; pulses assessed and found to be absent; pt immediately carried by mulitple staff and placed on ER stretcher where CPR began; ED MD met patient in trauma bay where patient was then intubated

## 2018-02-17 NOTE — ED Provider Notes (Signed)
[facility] DEPARTMENT Provider Note   CSN: [id number] Arrival date & time: 02/17/18  0258     History   Chief Complaint Chief Complaint  Patient presents with  . Respiratory Arrest    HPI Tesla Bochicchio is a 77 y.o. male.  Level 5 caveat for acuity of condition.  Patient presents through front door by private vehicle unresponsive and agonal respirations.  He is brought back emergently to trauma room with compressions in progress.  Patient foaming from mouth.  On initial assessment he does have a pulse but has minimal respiratory effort.  Patient given bag ventilation while being prepped for intubation.  After wife arrived, she states patient woke up complaining of shortness of breath and asked her to drive him to the hospital.  He denies any chest pain, cough or fever.  He was doing well yesterday.  He has a history of congestive heart failure, HIV, CKD.  He is not on dialysis but this was discussed in the past.   The history is provided by a relative and the EMS personnel. The history is limited by the condition of the patient.    No past medical history on file.  There are no active problems to display for this patient.   **The histories are not reviewed yet. Please review them in the "History" navigator section and refresh this [place].     Home Medications    Prior to Admission medications   Not on File    Family History No family history on file.  Social History Social History   Tobacco Use  . Smoking status: Not on file  Substance Use Topics  . Alcohol use: Not on file  . Drug use: Not on file     Allergies   Patient has no allergy information on record.   Review of Systems Review of Systems  Unable to perform ROS: Acuity of condition     Physical Exam Updated Vital Signs BP 105/81   Pulse 85   Resp 17   Ht _0  (1.88 m)   SpO2 100%   Physical Exam  Constitutional: He appears well-developed and  well-nourished. He appears distressed.  Obese, unresponsive, foaming at the mouth  HENT:  Head: Normocephalic and atraumatic.  Mouth/Throat: Oropharynx is clear and moist. No oropharyngeal exudate.  Eyes: Conjunctivae and EOM are normal. Pupils are equal, round, and reactive to light.  Neck: Normal range of motion. Neck supple.  No meningismus.  Cardiovascular: Normal rate, regular rhythm, normal heart sounds and intact distal pulses. Exam reveals no gallop.  No murmur heard. bradycardic  Pulmonary/Chest: Breath sounds normal. He is in respiratory distress.  Agonal breaths with diffuse rhonchi  Abdominal: Soft. There is no tenderness. There is no rebound and no guarding.  Musculoskeletal: Normal range of motion. He exhibits no edema or tenderness.  Neurological: No cranial nerve deficit. He exhibits normal muscle tone. Coordination normal.  Obtunded, no spontaneous movement, gag present.  Skin: Skin is warm. Capillary refill takes less than 2 seconds. No rash noted.  Psychiatric: He has a normal mood and affect. His behavior is normal.  Nursing note and vitals reviewed.    ED Treatments / Results  Labs (all labs ordered are listed, but only abnormal results are displayed) Labs Reviewed  MRSA PCR SCREENING - Abnormal; Notable for the following components:      Result Value   MRSA by PCR POSITIVE (*)    All other components within normal limits  CBC WITH DIFFERENTIAL/PLATELET - Abnormal; Notable for the following components:   WBC 10.9 (*)    RBC 4.03 (*)    MCV 103.0 (*)    Lymphs Abs 6.6 (*)    All other components within normal limits  COMPREHENSIVE METABOLIC PANEL - Abnormal; Notable for the following components:   CO2 19 (*)    Glucose, Bld 157 (*)    BUN 81 (*)    Creatinine, Ser 5.32 (*)    Calcium 8.8 (*)    GFR calc non Af Amer 9 (*)    GFR calc Af Amer 11 (*)    Anion gap 16 (*)    All other components within normal limits  BRAIN NATRIURETIC PEPTIDE - Abnormal;  Notable for the following components:   B Natriuretic Peptide 800.9 (*)    All other components within normal limits  ACETAMINOPHEN LEVEL - Abnormal; Notable for the following components:   Acetaminophen (Tylenol), Serum <10 (*)    All other components within normal limits  URINALYSIS, ROUTINE W REFLEX MICROSCOPIC - Abnormal; Notable for the following components:   APPearance CLOUDY (*)    Hgb urine dipstick LARGE (*)    Protein, ur 100 (*)    Bacteria, UA RARE (*)    Squamous Epithelial / LPF 0-5 (*)    All other components within normal limits  LACTIC ACID, PLASMA - Abnormal; Notable for the following components:   Lactic Acid, Venous 2.2 (*)    All other components within normal limits  TROPONIN I - Abnormal; Notable for the following components:   Troponin I 0.08 (*)    All other components within normal limits  GLUCOSE, CAPILLARY - Abnormal; Notable for the following components:   Glucose-Capillary 110 (*)    All other components within normal limits  CBG MONITORING, ED - Abnormal; Notable for the following components:   Glucose-Capillary 142 (*)    All other components within normal limits  I-STAT ARTERIAL BLOOD GAS, ED - Abnormal; Notable for the following components:   pH, Arterial 7.286 (*)    Acid-base deficit 5.0 (*)    All other components within normal limits  I-STAT CG4 LACTIC ACID, ED - Abnormal; Notable for the following components:   Lactic Acid, Venous 4.44 (*)    All other components within normal limits  I-STAT CHEM 8, ED - Abnormal; Notable for the following components:   BUN 76 (*)    Creatinine, Ser 5.30 (*)    Glucose, Bld 150 (*)    Calcium, Ion 1.11 (*)    All other components within normal limits  CULTURE, BLOOD (ROUTINE X 2)  RESPIRATORY PANEL BY PCR  CULTURE, BLOOD (ROUTINE X 2)  CULTURE, RESPIRATORY (NON-EXPECTORATED)  PROTIME-INR  SALICYLATE LEVEL  RAPID URINE DRUG SCREEN, HOSP PERFORMED  PROCALCITONIN  ETHANOL  LACTIC ACID, PLASMA    TROPONIN I  TROPONIN I  LACTATE DEHYDROGENASE  TRIGLYCERIDES  CBC WITH DIFFERENTIAL/PLATELET  MAGNESIUM  MAGNESIUM  PHOSPHORUS  PHOSPHORUS  I-STAT TROPONIN, ED    EKG  EKG Interpretation  Date/Time:  Sunday February 17 2018 03:09:49 EDT Ventricular Rate:  95 PR Interval:    QRS Duration: 157 QT Interval:  409 QTC Calculation: 515 R Axis:   -61 Text Interpretation:  Sinus rhythm Ventricular premature complex Biatrial enlargement Left bundle branch block No previous ECGs available Confirmed by Ezequiel Essex [phone]) on 02/17/2018 3:59:54 AM       Radiology Ct Head Wo Contrast  Result Date: 02/17/2018 CLINICAL DATA:  Altered  level of consciousness.  Found unresponsive. EXAM: CT HEAD WITHOUT CONTRAST TECHNIQUE: Contiguous axial images were obtained from the base of the skull through the vertex without intravenous contrast. COMPARISON:  Head CT 07/31/2017, brain MRI 11/01/2017 FINDINGS: Brain: Generalized atrophy and chronic small vessel ischemia, similar to prior exams. No intracranial hemorrhage, mass effect, or midline shift. No hydrocephalus. The basilar cisterns are patent. No evidence of territorial infarct or acute ischemia. No evidence of cerebral edema. No extra-axial or intracranial fluid collection. Vascular: No hyperdense vessel or unexpected calcification. Skull: No fracture or focal lesion. Sinuses/Orbits: Metallic density in the right globe anterior chamber is unchanged from prior exam. Bilateral cataract resection. Minimal metallic debris in the anterior scalp. Mild mucosal thickening of the ethmoid air cells and sphenoid sinus. Mastoid air cells are clear. Other: None. IMPRESSION: 1.  No acute intracranial abnormality. 2. Generalized atrophy and chronic small vessel ischemia. 3. Metallic density in the right globe, chronic and unchanged from prior exam. Electronically Signed   By: Jeb Levering M.D.   On: 02/17/2018 05:51   Ct Chest Wo Contrast  Result Date:  02/17/2018 CLINICAL DATA:  Found unresponsive. Post CPR. Acute resp illness, > 57 years old EXAM: CT CHEST WITHOUT CONTRAST TECHNIQUE: Multidetector CT imaging of the chest was performed following the standard protocol without IV contrast. COMPARISON:  Chest radiograph earlier this day. Additional radiographs and CT in a different patient jacket, most recent CT 11/25/2017 FINDINGS: Cardiovascular: Aortic atherosclerosis. Coronary artery calcifications. Mild multi chamber cardiomegaly. Minimal pericardial fluid. Mediastinum/Nodes: Scattered mediastinal nodes not enlarged by size criteria. Lack of IV contrast limits assessment for hilar adenopathy. Endotracheal and enteric tubes appropriately positioned. Lungs/Pleura: Multifocal confluent, ground-glass, patchy and nodular opacities throughout both lungs. Consolidations in the dependent lungs appear confluent, and there is debris within the distal trachea and right mainstem bronchi, suggesting aspiration. No definite septal thickening. No pleural fluid. Upper Abdomen: Tip and side port of the enteric tube in the stomach. Bilateral renal atrophy with hyperdense lesions in both kidneys, likely cysts but incompletely characterized. Musculoskeletal: No acute finding. There are no acute or suspicious osseous abnormalities. IMPRESSION: 1. Multifocal bilateral consolidative, ground-glass, nodular airspace opacities throughout both lungs. Minimal debris in the distal trachea and right mainstem bronchus. Overall findings suggest pneumonia with probable aspiration. Degree of pulmonary edema is also considered. Ground-glass opacities in a patient with HIV can be seen with pneumocystis pneumonia , however consolidative opacities are the prominent pattern. 2. Cardiomegaly with aortic atherosclerosis. Aortic Atherosclerosis (ICD10-I70.0). Electronically Signed   By: Jeb Levering M.D.   On: 02/17/2018 06:02   Dg Chest Portable 1 View  Result Date: 02/17/2018 CLINICAL DATA:   Shortness of breath. Intubation and orogastric tube placement. EXAM: PORTABLE CHEST 1 VIEW COMPARISON:  None. FINDINGS: Endotracheal tube 7.4 cm from the carina. Enteric tube in place, tip visualized in the stomach. The heart is enlarged. There are bilateral ill-defined perihilar opacities. Minimal Kerley B-lines in the periphery of the right lung. No large pleural effusion or evidence of pneumothorax. Lung apices not included in the field of view. IMPRESSION: 1. Endotracheal tube 7.4 cm from the carina. Enteric tube in place with tip visualized in the stomach. 2. Cardiomegaly. Bilateral ill-defined perihilar opacities, favoring pulmonary edema. Multifocal pneumonia or aspiration could have a similar appearance. Electronically Signed   By: Jeb Levering M.D.   On: 02/17/2018 03:45    Procedures Procedure Name: Intubation Date/Time: 02/17/2018 3:41 AM Performed by: Ezequiel Essex, MD Pre-anesthesia Checklist: Emergency Drugs available, Patient  being monitored, Timeout performed, Patient identified and Suction available Oxygen Delivery Method: Non-rebreather mask Preoxygenation: Pre-oxygenation with 100% oxygen Induction Type: Rapid sequence Ventilation: Mask ventilation with difficulty and Two handed mask ventilation required Laryngoscope Size: Glidescope, 4 and Mac Grade View: Grade II Tube type: Subglottic suction tube Tube size: 7.5 mm Number of attempts: 2 Airway Equipment and Method: Lighted stylet and Video-laryngoscopy Placement Confirmation: ETT inserted through vocal cords under direct vision,  Breath sounds checked- equal and bilateral and Positive ETCO2 Secured at: 25 cm Tube secured with: ETT holder Dental Injury: Teeth and Oropharynx as per pre-operative assessment  Difficulty Due To: Difficult Airway-  due to edematous airway and Difficult Airway- due to large tongue Future Recommendations: Recommend- induction with short-acting agent, and alternative techniques readily  available      (including critical care time)  Medications Ordered in ED Medications  furosemide (LASIX) 100 mg in dextrose 5 % 50 mL IVPB (100 mg Intravenous New Bag/Given 02/17/18 0320)  furosemide (LASIX) injection 80 mg (not administered)  furosemide (LASIX) 10 MG/ML injection (not administered)  nitroGLYCERIN 0.2 mg/mL in dextrose 5 % infusion (not administered)  propofol (DIPRIVAN) 1000 MG/100ML infusion (not administered)  nitroGLYCERIN 50 mg in dextrose 5 % 250 mL (0.2 mg/mL) infusion (5 mcg/min Intravenous New Bag/Given 02/17/18 0325)  propofol (DIPRIVAN) 1000 MG/100ML infusion (20 mcg/kg/min Intravenous Rate/Dose Change 02/17/18 0331)     Initial Impression / Assessment and Plan / ED Course  I have reviewed the triage vital signs and the nursing notes.  Pertinent labs & imaging results that were available during my care of the patient were reviewed by me and considered in my medical decision making (see chart for details).    Patient unresponsive with respiratory arrest.  Bagged on arrival.  Pulse present.  Patient intubated as above with copious edema from lungs.  Patient given IV Lasix, IV nitroglycerin.  EKG does not show a STEMI.  Cr 5.  Baseline appears to be in 4 range. K ok.   Attempting diuresis with IV lasix. Edema on CXR. Suspect respiratory and subsequent PEA arrest from pulmonary edema.   BP stable. May need dialysis CT head stable.   ICU admission d/w Dr. Jimmy Footman.   Angiocath insertion Performed by: Ezequiel Essex  Consent: Verbal consent obtained. Risks and benefits: risks, benefits and alternatives were discussed Time out: Immediately prior to procedure a "time out" was called to verify the correct patient, procedure, equipment, support staff and site/side marked as required.  Preparation: Patient was prepped and draped in the usual sterile fashion.  Vein Location: L EJ  Not Ultrasound Guided  Gauge: 18  Normal blood return and flush  without difficulty Patient tolerance: Patient tolerated the procedure well with no immediate complications.  Cardiopulmonary Resuscitation (CPR) Procedure Note Directed/Performed by: Ezequiel Essex I personally directed ancillary staff and/or performed CPR in an effort to regain return of spontaneous circulation and to maintain cardiac, neuro and systemic perfusion.   CRITICAL CARE Performed by: Ezequiel Essex Total critical care time: 60 minutes Critical care time was exclusive of separately billable procedures and treating other patients. Critical care was necessary to treat or prevent imminent or life-threatening deterioration. Critical care was time spent personally by me on the following activities: development of treatment plan with patient and/or surrogate as well as nursing, discussions with consultants, evaluation of patient's response to treatment, examination of patient, obtaining history from patient or surrogate, ordering and performing treatments and interventions, ordering and review of laboratory studies, ordering and  review of radiographic studies, pulse oximetry and re-evaluation of patient's condition.    Final Clinical Impressions(s) / ED Diagnoses   Final diagnoses:  Respiratory arrest ([place])  Acute pulmonary edema [facility])    ED Discharge Orders    None       Ezequiel Essex, MD 02/17/18 1003

## 2018-02-17 NOTE — Progress Notes (Signed)
ABG on 23ml/kg    VT 660 R-20 Fio2 100% Peep 5  Ph 7.28 CO2 44 PO2 103 HCO3 21.6

## 2018-02-17 NOTE — Consult Note (Signed)
Cardiology Consultation:   Patient ID: David [name]; [id number]; 1941-11-22   Admit date: 02/17/2018 Date of Consult: 02/17/2018  Primary Care Provider: No primary care provider on file. Primary Cardiologist: Skeet Latch, MD (new)   Patient Profile:   David [name] is a 77 y.o. 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 Lake Bells.  History of Present Illness:   David [name] presented to the emergency department 02/17/18 with acute shortness of breath.  History is taken by review of the medical record.  His wife reportedly states that he woke up feeling short of breath and asked her to bring him to the hospital.  Prior to this he was in his usual state of health.  Upon arrival he had agonal breathing and was pulseless.  CPR was started immediately.  By the time he was brought back to the trauma room he had regained a pulse.  His initial heart rate was 40 bpm.  He was coded for 10 minutes and intubated.  Chest x-ray was concerning for pulmonary edema versus multifocal pneumonia.  He was given Lasix and metolazone with no urine output.  Bladder scan upon arrival to the hospital showed 400 mL's of urine.  A Foley catheter was placed with very little urine output.  He was evaluated by nephrology with plans to initiate hemodialysis this admission.  Of note, he has been admitted twice recently with pulmonary edema.  11/2017 he was admitted for pneumonia and then admitted again 01/2018 with pulmonary edema.   Past Medical History:  Diagnosis Date  . Acute on chronic systolic and diastolic heart failure, NYHA class 4 ([place])   . CKD (chronic kidney disease) stage V requiring chronic dialysis ([place])   . HIV (human immunodeficiency virus infection) ([place])   . Prostate cancer [facility])     History reviewed. No  pertinent surgical history.   Home Medications:  Prior to Admission medications   Not on File    Inpatient Medications: Scheduled Meds: . carvedilol  6.25 mg Oral BID WC  . dolutegravir  50 mg Oral Daily  . emtricitabine-tenofovir AF  1 tablet Oral Daily  . feeding supplement (PRO-STAT SUGAR FREE 64)  30 mL Per Tube BID  . feeding supplement (VITAL HIGH PROTEIN)  1,000 mL Per Tube Q24H  . heparin  5,000 Units Subcutaneous Q8H  . insulin aspart  2-6 Units Subcutaneous Q4H  . pantoprazole (PROTONIX) IV  40 mg Intravenous QHS   Continuous Infusions: . sodium chloride 250 mL (02/17/18 0555)  . fentaNYL infusion INTRAVENOUS 75 mcg/hr (02/17/18 0900)  . furosemide 160 mg (02/17/18 1300)  . propofol (DIPRIVAN) infusion 20 mcg/kg/min (02/17/18 0857)   PRN Meds: sodium chloride, fentaNYL, midazolam, midazolam  Allergies:   No Known Allergies  Social History:   Social History   Socioeconomic History  . Marital status: Married    Spouse name: Not on file  . Number of children: Not on file  . Years of education: Not on file  . Highest education level: Not on file  Social Needs  . Financial resource strain: Not on file  . Food insecurity - worry: Not on file  . Food insecurity - inability: Not on file  . Transportation needs - medical: Not on file  . Transportation needs - non-medical: Not on file  Occupational History  .  Not on file  Tobacco Use  . Smoking status: Not on file  Substance and Sexual Activity  . Alcohol use: Not on file  . Drug use: Not on file  . Sexual activity: Not on file  Other Topics Concern  . Not on file  Social History Narrative  . Not on file    Family History:   History reviewed. No pertinent family history.  Unable to obtain. Patient intubated and sedated.   ROS:  Unable to obtain.  Patient intubated and sedated.   Physical Exam/Data:   Vitals:   02/17/18 1100 02/17/18 1112 02/17/18 1200 02/17/18 1253  BP: 94/65 (!) 100/53 134/82     Pulse: 74 76 84   Resp: 20 20 (!) 21   Temp:    98.9 F (37.2 C)  TempSrc:    Axillary  SpO2: 100% 100% 100%   Weight:      Height:        Intake/Output Summary (Last 24 hours) at 02/17/2018 1317 Last data filed at 02/17/2018 1200 Gross per 24 hour  Intake 233.5 ml  Output 6 ml  Net 227.5 ml   Filed Weights   02/17/18 0546  Weight: 206 lb 9.1 oz (93.7 kg)   VS:  BP 134/82   Pulse 84   Temp 98.9 F (37.2 C) (Axillary)   Resp (!) 21   Ht 6\' 2"  (1.88 m)   Wt 206 lb 9.1 oz (93.7 kg)   SpO2 100%   BMI 26.52 kg/m  , BMI Body mass index is 26.52 kg/m. GENERAL:  Critically ill-appearing.  Intubated and sedated.   NECK: Difficult to assess JVD  LUNGS: Vented breath sounds.  No crackles, wheezes or rhonchi.   HEART:  RRR.  PMI not displaced or sustained,S1 and S2 within normal limits, no S3, no S4, no clicks, no rubs,  murmurs ABD:  Flat, positive bowel sounds normal in frequency in pitch, no bruits, no rebound, no guarding, no midline pulsatile mass, no hepatomegaly, no splenomegaly EXT:  2 plus pulses throughout, no edema, no cyanosis no clubbing SKIN:  No rashes no nodules PSYCH:  Unable to assess.    EKG:  The EKG was personally reviewed and demonstrates:   Sinus rhythm.  Rate 95 bpm.  Left bundle branch block. Telemetry:  Telemetry was personally reviewed and demonstrates:  Sinus rhythm.  Relevant CV Studies: Echo pending  Laboratory Data:  Chemistry Recent Labs  Lab 02/17/18 0300 02/17/18 0312  NA 139 140  K 3.8 3.7  CL 104 104  CO2 19*  --   GLUCOSE 157* 150*  BUN 81* 76*  CREATININE 5.32* 5.30*  CALCIUM 8.8*  --   GFRNONAA 9*  --   GFRAA 11*  --   ANIONGAP 16*  --     Recent Labs  Lab 02/17/18 0300  PROT 7.9  ALBUMIN 4.1  AST 27  ALT 22  ALKPHOS 117  BILITOT 0.6   Hematology Recent Labs  Lab 02/17/18 0300 02/17/18 0312 02/17/18 1020  WBC 10.9*  --  9.7  RBC 4.03*  --  3.58*  HGB 13.5 15.0 11.4*  HCT 41.5 44.0 35.8*  MCV 103.0*  --   100.0  MCH 33.5  --  31.8  MCHC 32.5  --  31.8  RDW 13.7  --  13.6  PLT 256  --  234   Cardiac Enzymes Recent Labs  Lab 02/17/18 0455 02/17/18 1020  TROPONINI 0.08* 0.07*    Recent Labs  Lab  02/17/18 0309  TROPIPOC 0.05    BNP Recent Labs  Lab 02/17/18 0300  BNP 800.9*    DDimer No results for input(s): DDIMER in the last 168 hours.  Radiology/Studies:  Ct Head Wo Contrast  Result Date: 02/17/2018 CLINICAL DATA:  Altered level of consciousness.  Found unresponsive. EXAM: CT HEAD WITHOUT CONTRAST TECHNIQUE: Contiguous axial images were obtained from the base of the skull through the vertex without intravenous contrast. COMPARISON:  Head CT 07/31/2017, brain MRI 11/01/2017 FINDINGS: Brain: Generalized atrophy and chronic small vessel ischemia, similar to prior exams. No intracranial hemorrhage, mass effect, or midline shift. No hydrocephalus. The basilar cisterns are patent. No evidence of territorial infarct or acute ischemia. No evidence of cerebral edema. No extra-axial or intracranial fluid collection. Vascular: No hyperdense vessel or unexpected calcification. Skull: No fracture or focal lesion. Sinuses/Orbits: Metallic density in the right globe anterior chamber is unchanged from prior exam. Bilateral cataract resection. Minimal metallic debris in the anterior scalp. Mild mucosal thickening of the ethmoid air cells and sphenoid sinus. Mastoid air cells are clear. Other: None. IMPRESSION: 1.  No acute intracranial abnormality. 2. Generalized atrophy and chronic small vessel ischemia. 3. Metallic density in the right globe, chronic and unchanged from prior exam. Electronically Signed   By: Jeb Levering M.D.   On: 02/17/2018 05:51   Ct Chest Wo Contrast  Result Date: 02/17/2018 CLINICAL DATA:  Found unresponsive. Post CPR. Acute resp illness, > 41 years old EXAM: CT CHEST WITHOUT CONTRAST TECHNIQUE: Multidetector CT imaging of the chest was performed following the standard  protocol without IV contrast. COMPARISON:  Chest radiograph earlier this day. Additional radiographs and CT in a different patient jacket, most recent CT 11/25/2017 FINDINGS: Cardiovascular: Aortic atherosclerosis. Coronary artery calcifications. Mild multi chamber cardiomegaly. Minimal pericardial fluid. Mediastinum/Nodes: Scattered mediastinal nodes not enlarged by size criteria. Lack of IV contrast limits assessment for hilar adenopathy. Endotracheal and enteric tubes appropriately positioned. Lungs/Pleura: Multifocal confluent, ground-glass, patchy and nodular opacities throughout both lungs. Consolidations in the dependent lungs appear confluent, and there is debris within the distal trachea and right mainstem bronchi, suggesting aspiration. No definite septal thickening. No pleural fluid. Upper Abdomen: Tip and side port of the enteric tube in the stomach. Bilateral renal atrophy with hyperdense lesions in both kidneys, likely cysts but incompletely characterized. Musculoskeletal: No acute finding. There are no acute or suspicious osseous abnormalities. IMPRESSION: 1. Multifocal bilateral consolidative, ground-glass, nodular airspace opacities throughout both lungs. Minimal debris in the distal trachea and right mainstem bronchus. Overall findings suggest pneumonia with probable aspiration. Degree of pulmonary edema is also considered. Ground-glass opacities in a patient with HIV can be seen with pneumocystis pneumonia , however consolidative opacities are the prominent pattern. 2. Cardiomegaly with aortic atherosclerosis. Aortic Atherosclerosis (ICD10-I70.0). Electronically Signed   By: Jeb Levering M.D.   On: 02/17/2018 06:02   Dg Chest Portable 1 View  Result Date: 02/17/2018 CLINICAL DATA:  Shortness of breath. Intubation and orogastric tube placement. EXAM: PORTABLE CHEST 1 VIEW COMPARISON:  None. FINDINGS: Endotracheal tube 7.4 cm from the carina. Enteric tube in place, tip visualized in the  stomach. The heart is enlarged. There are bilateral ill-defined perihilar opacities. Minimal Kerley B-lines in the periphery of the right lung. No large pleural effusion or evidence of pneumothorax. Lung apices not included in the field of view. IMPRESSION: 1. Endotracheal tube 7.4 cm from the carina. Enteric tube in place with tip visualized in the stomach. 2. Cardiomegaly. Bilateral ill-defined perihilar opacities, favoring  pulmonary edema. Multifocal pneumonia or aspiration could have a similar appearance. Electronically Signed   By: Jeb Levering M.D.   On: 02/17/2018 03:45    Assessment and Plan:   # Acute on chronic systolic and diastolic heart failure: # hypoxic/hypercardic respiratory failure: BNP 800.  CXR concerning for edema vs. Infiltrate.  He had no infectious symptoms or leukocytosis.  Likely that this represents volume overload from both heart failure and worsening renal dysfunction.  Agree with starting HD.  He hasn't had any significant UOP despite high doses of diuretics.  Carvedilol was reduced due to low BP.  He is warm on exam and doesn't appear to be in cardiogenic shock.  Unable to check co-ox without the appropriate access.  Echo pending.  No ARB given his renal function and low BP.  Family currently unavailable. Unclear if he previously had an ischemia evaluation or the rest of his cardiac history.  Will need to assess when family is here.  # CKD V: No response to lasix. Per nephrology will start HD tomorrow.   # Elevated troponin: Troponin very mildly elevated to 0.08.  This is likely demand ischemia and due to chest compressions.  EKG reveals left bundle branch block.  Chronicity of this is unclear.  # Prostate cancer: he has prostate cancer treated with bracytherapy.  Will check PSA given his bladder outlet obstruction.   Time spent: 60 minutes-Greater than 50% of this time was spent in counseling, explanation of diagnosis, planning of further management, and  coordination of care.   For questions or updates, please contact [place] Please consult [email or web address] for contact info under Cardiology/STEMI.   Signed, Skeet Latch, MD  02/17/2018 1:17 PM

## 2018-02-17 NOTE — ED Notes (Signed)
CCM at bedside 

## 2018-02-17 NOTE — Progress Notes (Signed)
[facility] PCCM Attending:  Admitted overnight  Brief: 77 y/o male with systolic CHF, Prostate Cancer, HIV admitted overnight for acute respiratory failure with hypoxemia in setting of pulmonary edema.  Had a cardiac arrest for 10 minutes.  Subjective: following commands, interacting overnight  Vitals:   02/17/18 0500 02/17/18 0537 02/17/18 0546 02/17/18 0600  BP: 96/81   91/65  Pulse: 69   69  Resp: 18   (!) 21  Temp:    97.9 F (36.6 C)  TempSrc:    Axillary  SpO2: 100% 100%  100%  Weight:   206 lb 9.1 oz (93.7 kg)   Height:       Vent Mode: PRVC FiO2 (%):  [100 %] 100 % Set Rate:  [20 bmp] 20 bmp Vt Set:  [315 mL] 660 mL PEEP:  [5 cmH20] 5 cmH20 Plateau Pressure:  [26 cmH20-29 cmH20] 26 cmH20  General:  In bed on vent HENT: NCAT ETT in place PULM: CTA B, vent supported breathing CV: RRR, no mgr GI: BS+, soft, nontender MSK: normal bulk and tone Neuro: sedated on vent   CBC    Component Value Date/Time   WBC 10.9 (H) 02/17/2018 0300   RBC 4.03 (L) 02/17/2018 0300   HGB 15.0 02/17/2018 0312   HCT 44.0 02/17/2018 0312   PLT 256 02/17/2018 0300   MCV 103.0 (H) 02/17/2018 0300   MCH 33.5 02/17/2018 0300   MCHC 32.5 02/17/2018 0300   RDW 13.7 02/17/2018 0300   LYMPHSABS 6.6 (H) 02/17/2018 0300   MONOABS 0.4 02/17/2018 0300   EOSABS 0.4 02/17/2018 0300   BASOSABS 0.0 02/17/2018 0300   BMET    Component Value Date/Time   NA 140 02/17/2018 0312   K 3.7 02/17/2018 0312   CL 104 02/17/2018 0312   CO2 19 (L) 02/17/2018 0300   GLUCOSE 150 (H) 02/17/2018 0312   BUN 76 (H) 02/17/2018 0312   CREATININE 5.30 (H) 02/17/2018 0312   CALCIUM 8.8 (L) 02/17/2018 0300   GFRNONAA 9 (L) 02/17/2018 0300   GFRAA 11 (L) 02/17/2018 0300   CT chest images reviewed showing dependent consolidation, patchy ground glass, ett in place, minimal pleural effusion  Impression/plan:  Acute respiratory failure with hypxoemia: given clinical context (lack of fever, infectious symptoms) suspect  pulmonary edema, gave bumex this morning, will discuss with cardiology and renal today, continue full vent support, f/u resp culture and viral panel, hold antibiotics for now Acute on chronic kidney failure: continue bmex, consult renal Acute systolic heart failure: consult cardiology, restart coreg 1/2 home dose HIV: restart HAART Start tube feeding Write order for propofol  Family updated by me  Additional CC time 30 minutes  Roselie Awkward, MD [place] PCCM Pager: [phone] Cell: [phone] After 3pm or if no response, call [phone]

## 2018-02-17 NOTE — Progress Notes (Signed)
Patient transported to CT scan and to 7B40 without complications. Report given to receiving RT.

## 2018-02-18 ENCOUNTER — Inpatient Hospital Stay (HOSPITAL_COMMUNITY): Payer: Medicare HMO

## 2018-02-18 ENCOUNTER — Encounter (HOSPITAL_COMMUNITY): Payer: Self-pay | Admitting: Student

## 2018-02-18 DIAGNOSIS — I2699 Other pulmonary embolism without acute cor pulmonale: Secondary | ICD-10-CM

## 2018-02-18 DIAGNOSIS — I5023 Acute on chronic systolic (congestive) heart failure: Secondary | ICD-10-CM

## 2018-02-18 DIAGNOSIS — I351 Nonrheumatic aortic (valve) insufficiency: Secondary | ICD-10-CM

## 2018-02-18 DIAGNOSIS — R092 Respiratory arrest: Secondary | ICD-10-CM

## 2018-02-18 DIAGNOSIS — J810 Acute pulmonary edema: Secondary | ICD-10-CM

## 2018-02-18 DIAGNOSIS — B20 Human immunodeficiency virus [HIV] disease: Secondary | ICD-10-CM

## 2018-02-18 DIAGNOSIS — J449 Chronic obstructive pulmonary disease, unspecified: Secondary | ICD-10-CM

## 2018-02-18 HISTORY — PX: IR US GUIDE VASC ACCESS RIGHT: IMG2390

## 2018-02-18 HISTORY — PX: IR FLUORO GUIDE CV LINE RIGHT: IMG2283

## 2018-02-18 LAB — GLUCOSE, CAPILLARY
GLUCOSE-CAPILLARY: 109 mg/dL — AB (ref 65–99)
GLUCOSE-CAPILLARY: 76 mg/dL (ref 65–99)
Glucose-Capillary: 117 mg/dL — ABNORMAL HIGH (ref 65–99)
Glucose-Capillary: 123 mg/dL — ABNORMAL HIGH (ref 65–99)
Glucose-Capillary: 110 mg/dL — ABNORMAL HIGH (ref 65–99)

## 2018-02-18 LAB — CBC
HCT: 31 % — ABNORMAL LOW (ref 39.0–52.0)
HCT: 32.8 % — ABNORMAL LOW (ref 39.0–52.0)
HEMOGLOBIN: 10.1 g/dL — AB (ref 13.0–17.0)
Hemoglobin: 10.3 g/dL — ABNORMAL LOW (ref 13.0–17.0)
MCH: 32.4 pg (ref 26.0–34.0)
MCH: 31 pg (ref 26.0–34.0)
MCHC: 32.6 g/dL (ref 30.0–36.0)
MCHC: 31.4 g/dL (ref 30.0–36.0)
MCV: 99.4 fL (ref 78.0–100.0)
MCV: 98.8 fL (ref 78.0–100.0)
PLATELETS: 186 10*3/uL (ref 150–400)
PLATELETS: 183 10*3/uL (ref 150–400)
RBC: 3.12 MIL/uL — AB (ref 4.22–5.81)
RBC: 3.32 MIL/uL — ABNORMAL LOW (ref 4.22–5.81)
RDW: 14.2 % (ref 11.5–15.5)
RDW: 14.1 % (ref 11.5–15.5)
WBC: 10.8 10*3/uL — AB (ref 4.0–10.5)
WBC: 11.6 10*3/uL — ABNORMAL HIGH (ref 4.0–10.5)

## 2018-02-18 LAB — PSA: Prostatic Specific Antigen: 0.01 ng/mL (ref 0.00–4.00)

## 2018-02-18 LAB — RENAL FUNCTION PANEL
Albumin: 3.1 g/dL — ABNORMAL LOW (ref 3.5–5.0)
Anion gap: 11 (ref 5–15)
BUN: 102 mg/dL — AB (ref 6–20)
CALCIUM: 8.5 mg/dL — AB (ref 8.9–10.3)
CO2: 24 mmol/L (ref 22–32)
CREATININE: 5.6 mg/dL — AB (ref 0.61–1.24)
Chloride: 104 mmol/L (ref 101–111)
GFR calc Af Amer: 10 mL/min — ABNORMAL LOW (ref >60)
GFR calc non Af Amer: 9 mL/min — ABNORMAL LOW (ref >60)
GLUCOSE: 115 mg/dL — AB (ref 65–99)
Phosphorus: 3.9 mg/dL (ref 2.5–4.6)
Potassium: 3.3 mmol/L — ABNORMAL LOW (ref 3.5–5.1)
SODIUM: 139 mmol/L (ref 135–145)

## 2018-02-18 LAB — ECHOCARDIOGRAM COMPLETE
Height: 74 in
WEIGHTICAEL: 3266.34 [oz_av]

## 2018-02-18 LAB — PHOSPHORUS
PHOSPHORUS: 3.8 mg/dL (ref 2.5–4.6)
Phosphorus: 3.8 mg/dL (ref 2.5–4.6)

## 2018-02-18 LAB — BASIC METABOLIC PANEL
Anion gap: 14 (ref 5–15)
BUN: 96 mg/dL — ABNORMAL HIGH (ref 6–20)
CALCIUM: 8.2 mg/dL — AB (ref 8.9–10.3)
CO2: 20 mmol/L — AB (ref 22–32)
CREATININE: 5.46 mg/dL — AB (ref 0.61–1.24)
Chloride: 102 mmol/L (ref 101–111)
GFR, EST AFRICAN AMERICAN: 11 mL/min — AB (ref >60)
GFR, EST NON AFRICAN AMERICAN: 9 mL/min — AB (ref >60)
Glucose, Bld: 147 mg/dL — ABNORMAL HIGH (ref 65–99)
Potassium: 3.1 mmol/L — ABNORMAL LOW (ref 3.5–5.1)
SODIUM: 136 mmol/L (ref 135–145)

## 2018-02-18 LAB — MAGNESIUM
MAGNESIUM: 2.1 mg/dL (ref 1.7–2.4)
Magnesium: 2.3 mg/dL (ref 1.7–2.4)

## 2018-02-18 LAB — PATHOLOGIST SMEAR REVIEW

## 2018-02-18 LAB — PROCALCITONIN: PROCALCITONIN: 18.88 ng/mL

## 2018-02-18 MED ORDER — SODIUM CHLORIDE 0.9 % IV SOLN: 100.0000 mL | INTRAVENOUS | Status: DC | PRN

## 2018-02-18 MED ORDER — POTASSIUM CHLORIDE 20 MEQ/15ML (10%) PO SOLN
40.0000 meq | Freq: Once | ORAL | Status: AC
  Administered 2018-02-18: 40 meq via ORAL
  Filled 2018-02-18: qty 30

## 2018-02-18 MED ORDER — LIDOCAINE HCL (PF) 1 % IJ SOLN
INTRAMUSCULAR | Status: AC | PRN
  Administered 2018-02-18: 10 mL

## 2018-02-18 MED ORDER — HEPARIN SODIUM (PORCINE) 1000 UNIT/ML IJ SOLN
INTRAMUSCULAR | Status: AC
  Administered 2018-02-18: 3.8 mL
  Filled 2018-02-18: qty 1

## 2018-02-18 MED ORDER — LIDOCAINE HCL (PF) 1 % IJ SOLN: 5.0000 mL | INTRAMUSCULAR | Status: DC | PRN

## 2018-02-18 MED ORDER — HEPARIN SODIUM (PORCINE) 1000 UNIT/ML DIALYSIS: 1000.0000 [IU] | INTRAMUSCULAR | Status: DC | PRN

## 2018-02-18 MED ORDER — CEFAZOLIN SODIUM-DEXTROSE 2-4 GM/100ML-% IV SOLN
2.0000 g | Freq: Once | INTRAVENOUS | Status: AC
  Administered 2018-02-18: 2 g via INTRAVENOUS
  Filled 2018-02-18: qty 100

## 2018-02-18 MED ORDER — HEPARIN SODIUM (PORCINE) 1000 UNIT/ML DIALYSIS: 20.0000 [IU]/kg | INTRAMUSCULAR | Status: DC | PRN

## 2018-02-18 MED ORDER — VITAL AF 1.2 CAL PO LIQD
1000.0000 mL | ORAL | Status: DC
  Administered 2018-02-18: 1000 mL
  Filled 2018-02-18: qty 1000

## 2018-02-18 MED ORDER — ALTEPLASE 2 MG IJ SOLR: 2.0000 mg | Freq: Once | INTRAMUSCULAR | Status: DC | PRN

## 2018-02-18 MED ORDER — LIDOCAINE-PRILOCAINE 2.5-2.5 % EX CREA
1.0000 "application " | TOPICAL_CREAM | CUTANEOUS | Status: DC | PRN
  Filled 2018-02-18: qty 5

## 2018-02-18 MED ORDER — LIDOCAINE HCL 1 % IJ SOLN
INTRAMUSCULAR | Status: AC
  Administered 2018-02-18: 17:00:00
  Filled 2018-02-18: qty 20

## 2018-02-18 MED ORDER — PENTAFLUOROPROP-TETRAFLUOROETH EX AERO: 1.0000 "application " | INHALATION_SPRAY | CUTANEOUS | Status: DC | PRN

## 2018-02-18 NOTE — Plan of Care (Signed)
Patient on Vital High Protein tube feedings to meet nutritional demands. Patient environment maintained to promote patient safety and accident prevention.

## 2018-02-18 NOTE — Sedation Documentation (Signed)
Patient is resting comfortably. 

## 2018-02-18 NOTE — Progress Notes (Signed)
  Echocardiogram 2D Echocardiogram has been performed.  Jannett Celestine 02/18/2018, 10:14 AM

## 2018-02-18 NOTE — Progress Notes (Signed)
Initial Nutrition Assessment  DOCUMENTATION CODES:   Not applicable  INTERVENTION:   Vital AF 1.2 @ 50 ml/hr 30 ml Prostat BID  Provides: 1640 kcals (1935 kcal with propofol), 120 grams protein, 972 ml free water.   NUTRITION DIAGNOSIS:   Inadequate oral intake related to inability to eat as evidenced by NPO status.  GOAL:   Provide needs based on ASPEN/SCCM guidelines  MONITOR:   Labs, Weight trends, Diet advancement, Vent status, TF tolerance, I & O's  REASON FOR ASSESSMENT:   Consult Enteral/tube feeding initiation and management  ASSESSMENT:   Patient with PMH significant for immunosuppression HIV, prostate cancer, CHF, CKD V (not currently on HD), and HTN. Presents this admission with shortness of breath and coded upon arrival to emergency room. Admitted for cardiogenic pulmonary edema and PEA arrest.    Pt had little urine output with high doses of lasix last night.  Nephrology plans to start chronic HD this admission after tunnel catheter placement.  Spoke with wife at bedside. Denies pt had any loss of appetite or weight loss PTA.  Records are limited in wt history.   Patient is currently intubated on ventilator support MV: 13.0 L/min Temp (24hrs), Avg:98.2 F (36.8 C), Min:97.7 F (36.5 C), Max:98.9 F (37.2 C) BP: 97/65 MAP: 75 Propofol: 11.2 ml/hr- 295 calories  I/O- +298 ml since admit UOP- 1850 ml yesterday   Medications reviewed and include: fentanyl, 160 mg lasix in D5, propofol Labs reviewed: K 3.1 (L) Creatinine 5.46 (H)   NUTRITION - FOCUSED PHYSICAL EXAM:    Most Recent Value  Orbital Region  No depletion  Upper Arm Region  No depletion  Thoracic and Lumbar Region  Unable to assess  Buccal Region  Unable to assess  Temple Region  No depletion  Clavicle Bone Region  No depletion  Clavicle and Acromion Bone Region  No depletion  Scapular Bone Region  Unable to assess  Dorsal Hand  No depletion  Patellar Region  No depletion  Anterior  Thigh Region  No depletion  Posterior Calf Region  No depletion  Edema (RD Assessment)  Unable to assess  Hair  Reviewed  Eyes  Unable to assess  Mouth  Unable to assess  Skin  Reviewed  Nails  Reviewed       Diet Order:  Diet NPO time specified  EDUCATION NEEDS:   Not appropriate for education at this time  Skin:  Skin Assessment: Reviewed RN Assessment  Last BM:  PTA  Height:   Ht Readings from Last 1 Encounters:  02/17/18 6\' 2"  (1.88 m)    Weight:   Wt Readings from Last 1 Encounters:  02/18/18 204 lb 2.3 oz (92.6 kg)    Ideal Body Weight:  86.4 kg  BMI:  Body mass index is 26.21 kg/m.  Estimated Nutritional Needs:   Kcal:  1965 kcal (PSU)  Protein:  115-125 g (1.2-1.3 g/kg)  Fluid:  1000 ml + UOP    Mariana Single RD, LDN Clinical Nutrition Pager # - [phone]

## 2018-02-18 NOTE — Progress Notes (Signed)
Arrived to patient room 2H-21 at 2039.  Reviewed treatment plan and this RN agrees with plan.  Report received from bedside RN, Marcene Brawn.  Consent verified.  Patient A & O X 4.   Lung sounds coarse to ausculation in all fields. BLE 1+ pitting edema. Cardiac:  NSR, ectopy noted.  Removed caps and cleansed RIJ catheter with chlorhedxidine.  Aspirated ports of heparin and flushed them with saline per protocol.  Connected and secured lines, initiated treatment at 2300.  UF Goal of 1500 mL and net fluid removal 1.5 L.  Will continue to monitor.

## 2018-02-18 NOTE — Progress Notes (Signed)
Pt transported to/from IR via vent w/ no apparent complications.

## 2018-02-18 NOTE — Telephone Encounter (Signed)
Rx has been sent to the pharmacy electronically. ° °

## 2018-02-18 NOTE — Procedures (Signed)
  Procedure: R IJ tunneled HD catheter   EBL:   minimal Complications:  none immediate  See full dictation in [facility] PACS.  D. Littie Chiem MD Main # [phone] Pager  [phone]    

## 2018-02-18 NOTE — Progress Notes (Signed)
Bilateral lower extremity venous duplex has been completed. Negative for DVT.  02/18/18 12:25 PM Carlos Levering RVT

## 2018-02-18 NOTE — Consult Note (Signed)
Chief Complaint: Patient was seen in consultation today for ESRD.  Referring Physician(s): Corliss Parish  Supervising Physician: Arne Cleveland  Patient Status: [facility] - In-pt  History of Present Illness: David [name] is a 77 y.o. male  Hx ESRD and 2 failed access sites. Patient intubated. IR consulted for tunneled HD catheter placement per request of Dr. Moshe Cipro due to dialysis initiation.  Past Medical History:  Diagnosis Date  . Acute on chronic systolic and diastolic heart failure, NYHA class 4 ([place])   . CKD (chronic kidney disease) stage V requiring chronic dialysis ([place])   . HIV (human immunodeficiency virus infection) ([place])   . Prostate cancer [facility])     History reviewed. No pertinent surgical history.  Allergies: Patient has no known allergies.  Medications: Prior to Admission medications   Medication Sig Start Date End Date Taking? Authorizing Provider  allopurinol (ZYLOPRIM) 100 MG tablet Take 100 mg by mouth every morning. 01/23/18  Yes [provider]  amLODipine (NORVASC) 10 MG tablet Take 10 mg by mouth every morning. 12/05/17  Yes [provider]  aspirin EC 81 MG tablet Take 81 mg by mouth daily.   Yes [provider]  brimonidine (ALPHAGAN) 0.15 % ophthalmic solution Place 1 drop into the left eye 2 (two) times daily. 11/15/17  Yes [provider]  carvedilol (COREG) 25 MG tablet Take 25 mg by mouth 2 (two) times daily. 12/05/17  Yes [provider]  colchicine 0.6 MG tablet Take 0.6 mg by mouth See admin instructions. Taker 1 tablet (0.6mg ) once daily for 7 days just when having flare up.   Yes [provider]  DESCOVY 200-25 MG tablet Take 1 tablet by mouth every morning. 01/20/18  Yes [provider]  dorzolamide-timolol (COSOPT) 22.3-6.8 MG/ML ophthalmic solution Place 1 drop into the left eye 2 (two) times daily. 12/17/17  Yes [provider]  furosemide (LASIX) 80 MG  tablet Take 160 mg by mouth 2 (two) times daily. 01/22/18  Yes [provider]  latanoprost (XALATAN) 0.005 % ophthalmic solution Place 1 drop into the left eye at bedtime. 11/14/17  Yes [provider]  potassium chloride (K-DUR) 10 MEQ tablet Take 20 mEq by mouth daily.   Yes [provider]  terazosin (HYTRIN) 10 MG capsule Take 10 mg by mouth at bedtime.   Yes [provider]  TIVICAY 50 MG tablet Take 50 mg by mouth every morning. 01/20/18  Yes [provider]  Vitamin D, Ergocalciferol, (DRISDOL) 50000 units CAPS capsule Take 50,000 Units by mouth once a week. 02/11/18  Yes [provider]  sulfamethoxazole-trimethoprim (BACTRIM DS,SEPTRA DS) 800-160 MG tablet Take 1 tablet by mouth 3 (three) times a week.    [provider]     History reviewed. No pertinent family history.  Social History   Socioeconomic History  . Marital status: Married    Spouse name: None  . Number of children: None  . Years of education: None  . Highest education level: None  Social Needs  . Financial resource strain: None  . Food insecurity - worry: None  . Food insecurity - inability: None  . Transportation needs - medical: None  . Transportation needs - non-medical: None  Occupational History  . None  Tobacco Use  . Smoking status: None  Substance and Sexual Activity  . Alcohol use: None  . Drug use: None  . Sexual activity: None  Other Topics Concern  . None  Social History Narrative  .  None     Review of Systems: A 12 point ROS discussed and pertinent positives are indicated in the HPI above.  All other systems are negative.  Review of Systems  Unable to perform ROS: Intubated    Vital Signs: BP 95/60   Pulse 72   Temp 98.4 F (36.9 C) (Axillary)   Resp 20   Ht 6\' 2"  (1.88 m)   Wt 204 lb 2.3 oz (92.6 kg)   SpO2 100%   BMI 26.21 kg/m   Physical Exam  Constitutional: He appears well-developed. No distress.    Cardiovascular: Normal rate, regular rhythm, normal heart sounds and intact distal pulses.  No murmur heard. Pulmonary/Chest: Effort normal and breath sounds normal. He has no wheezes.  Neurological: He is alert.  Skin: Skin is warm and dry.  Psychiatric:  Intubated.  Nursing note and vitals reviewed.    MD Evaluation Airway: WNL Heart: WNL Abdomen: WNL Chest/ Lungs: WNL ASA  Classification: 4 Mallampati/Airway Score: (Intubated)   Imaging: Ct Head Wo Contrast  Result Date: 02/17/2018 CLINICAL DATA:  Altered level of consciousness.  Found unresponsive. EXAM: CT HEAD WITHOUT CONTRAST TECHNIQUE: Contiguous axial images were obtained from the base of the skull through the vertex without intravenous contrast. COMPARISON:  Head CT 07/31/2017, brain MRI 11/01/2017 FINDINGS: Brain: Generalized atrophy and chronic small vessel ischemia, similar to prior exams. No intracranial hemorrhage, mass effect, or midline shift. No hydrocephalus. The basilar cisterns are patent. No evidence of territorial infarct or acute ischemia. No evidence of cerebral edema. No extra-axial or intracranial fluid collection. Vascular: No hyperdense vessel or unexpected calcification. Skull: No fracture or focal lesion. Sinuses/Orbits: Metallic density in the right globe anterior chamber is unchanged from prior exam. Bilateral cataract resection. Minimal metallic debris in the anterior scalp. Mild mucosal thickening of the ethmoid air cells and sphenoid sinus. Mastoid air cells are clear. Other: None. IMPRESSION: 1.  No acute intracranial abnormality. 2. Generalized atrophy and chronic small vessel ischemia. 3. Metallic density in the right globe, chronic and unchanged from prior exam. Electronically Signed   By: Jeb Levering M.D.   On: 02/17/2018 05:51   Ct Chest Wo Contrast  Result Date: 02/17/2018 CLINICAL DATA:  Found unresponsive. Post CPR. Acute resp illness, > 56 years old EXAM: CT CHEST WITHOUT CONTRAST  TECHNIQUE: Multidetector CT imaging of the chest was performed following the standard protocol without IV contrast. COMPARISON:  Chest radiograph earlier this day. Additional radiographs and CT in a different patient jacket, most recent CT 11/25/2017 FINDINGS: Cardiovascular: Aortic atherosclerosis. Coronary artery calcifications. Mild multi chamber cardiomegaly. Minimal pericardial fluid. Mediastinum/Nodes: Scattered mediastinal nodes not enlarged by size criteria. Lack of IV contrast limits assessment for hilar adenopathy. Endotracheal and enteric tubes appropriately positioned. Lungs/Pleura: Multifocal confluent, ground-glass, patchy and nodular opacities throughout both lungs. Consolidations in the dependent lungs appear confluent, and there is debris within the distal trachea and right mainstem bronchi, suggesting aspiration. No definite septal thickening. No pleural fluid. Upper Abdomen: Tip and side port of the enteric tube in the stomach. Bilateral renal atrophy with hyperdense lesions in both kidneys, likely cysts but incompletely characterized. Musculoskeletal: No acute finding. There are no acute or suspicious osseous abnormalities. IMPRESSION: 1. Multifocal bilateral consolidative, ground-glass, nodular airspace opacities throughout both lungs. Minimal debris in the distal trachea and right mainstem bronchus. Overall findings suggest pneumonia with probable aspiration. Degree of pulmonary edema is also considered. Ground-glass opacities in a patient with HIV can be seen with pneumocystis pneumonia , however consolidative  opacities are the prominent pattern. 2. Cardiomegaly with aortic atherosclerosis. Aortic Atherosclerosis (ICD10-I70.0). Electronically Signed   By: Jeb Levering M.D.   On: 02/17/2018 06:02   Dg Chest Port 1 View  Result Date: 02/18/2018 CLINICAL DATA:  Acute respiratory failure, hypoxia, intubated patient. History of CHF, renal insufficiency, HIV, and prostate malignancy. EXAM:  PORTABLE CHEST 1 VIEW COMPARISON:  Chest x-ray and chest CT scan of February 17, 2018 FINDINGS: The lungs are adequately inflated. The interstitial markings remain increased and are more conspicuous today on the left. The cardiac silhouette remains enlarged. The pulmonary vascularity is not engorged. The endotracheal tube tip lies 6.4 cm above the carina. The esophagogastric tube tip and proximal port project below the GE junction. IMPRESSION: Persistent bilateral pneumonia. Slight increased opacity noted on the left today. No overt pulmonary edema. The support tubes are in reasonable position where visualized. Electronically Signed   By: David  Martinique M.D.   On: 02/18/2018 08:35   Dg Chest Portable 1 View  Result Date: 02/17/2018 CLINICAL DATA:  Shortness of breath. Intubation and orogastric tube placement. EXAM: PORTABLE CHEST 1 VIEW COMPARISON:  None. FINDINGS: Endotracheal tube 7.4 cm from the carina. Enteric tube in place, tip visualized in the stomach. The heart is enlarged. There are bilateral ill-defined perihilar opacities. Minimal Kerley B-lines in the periphery of the right lung. No large pleural effusion or evidence of pneumothorax. Lung apices not included in the field of view. IMPRESSION: 1. Endotracheal tube 7.4 cm from the carina. Enteric tube in place with tip visualized in the stomach. 2. Cardiomegaly. Bilateral ill-defined perihilar opacities, favoring pulmonary edema. Multifocal pneumonia or aspiration could have a similar appearance. Electronically Signed   By: Jeb Levering M.D.   On: 02/17/2018 03:45    Labs:  CBC: Recent Labs    02/17/18 0300 02/17/18 0312 02/17/18 1020 02/18/18 0558  WBC 10.9*  --  9.7 10.8*  HGB 13.5 15.0 11.4* 10.1*  HCT 41.5 44.0 35.8* 31.0*  PLT 256  --  234 186    COAGS: Recent Labs    02/17/18 0300  INR 0.98    BMP: Recent Labs    02/17/18 0300 02/17/18 0312 02/17/18 2207 02/18/18 0558  NA 139 140 137 136  K 3.8 3.7 3.3* 3.1*  CL  104 104 102 102  CO2 19*  --  24 20*  GLUCOSE 157* 150* 147* 147*  BUN 81* 76* 89* 96*  CALCIUM 8.8*  --  8.2* 8.2*  CREATININE 5.32* 5.30* 5.40* 5.46*  GFRNONAA 9*  --  9* 9*  GFRAA 11*  --  11* 11*    LIVER FUNCTION TESTS: Recent Labs    02/17/18 0300  BILITOT 0.6  AST 27  ALT 22  ALKPHOS 117  PROT 7.9  ALBUMIN 4.1    TUMOR MARKERS: No results for input(s): AFPTM, CEA, CA199, CHROMGRNA in the last 8760 hours.  Assessment and Plan:  ESRD with hx 2 failed access sites.  IR consulted for tunneled HD catheter placement per request of Dr. Moshe Cipro due to dialysis initiation. Patient is NPO and afebrile with negative cultures. Plan for tunneled HD catheter insertion today. Consent obtained by wife.  Risks and benefits discussed with the patient including, but not limited to bleeding, infection, vascular injury, pneumothorax which may require chest tube placement, air embolism or even death All of the patient's questions were answered, patient is agreeable to proceed. Consent signed and in chart.  Thank you for this interesting consult.  I greatly  enjoyed meeting David [name] and look forward to participating in their care.  A copy of this report was sent to the requesting provider on this date.  Electronically Signed: Earley Abide, PA-C 02/18/2018, 10:41 AM   I spent a total of 30 minutes in face to face in clinical consultation, greater than 50% of which was counseling/coordinating care for ESRD.

## 2018-02-18 NOTE — Progress Notes (Signed)
Assessment/Plan: 77 year old BM with controlled HIV, EF 20-25 % as well as stage 5 CKD.  This is third hosp in 3 mos for pulmonary edema/pulmonary issues   1. New ESRD for dialysis cath today via IR; then HD 2. Hypertension/volume  - BPs soft 3 VDRF 4. Bones- PTH pending 5. Hypokalemia  Subjective: Interval History: Seen by IR, on Vent  Objective: Vital signs in last 24 hours: Temp:  [97.7 F (36.5 C)-98.7 F (37.1 C)] 98.7 F (37.1 C) (03/18 1215) Pulse Rate:  [60-134] 78 (03/18 1620) Resp:  [16-21] 16 (03/18 1620) BP: (78-110)/(52-81) 106/78 (03/18 1621) SpO2:  [98 %-100 %] 100 % (03/18 1400) FiO2 (%):  [40 %] 40 % (03/18 1138) Weight:  [92.6 kg (204 lb 2.3 oz)] 92.6 kg (204 lb 2.3 oz) (03/18 0500) Weight change: -1.1 kg (-6.8 oz)  Intake/Output from previous day: 03/17 0701 - 03/18 0700 In: 2037.1 [I.V.:689.1; NG/GT:1150; IV Piggyback:198] Out: 1850 [Urine:1850] Intake/Output this shift: Total I/O In: 594.3 [I.V.:268.3; NG/GT:160; IV Piggyback:166] Out: 475 [Urine:475]  General appearance: on vent Resp: clear to auscultation bilaterally Cardio: regular rate and rhythm, S1, S2 normal, no murmur, click, rub or gallop  Lab Results: Recent Labs    02/17/18 1020 02/18/18 0558  WBC 9.7 10.8*  HGB 11.4* 10.1*  HCT 35.8* 31.0*  PLT 234 186   BMET:  Recent Labs    02/17/18 2207 02/18/18 0558  NA 137 136  K 3.3* 3.1*  CL 102 102  CO2 24 20*  GLUCOSE 147* 147*  BUN 89* 96*  CREATININE 5.40* 5.46*  CALCIUM 8.2* 8.2*   No results for input(s): PTH in the last 72 hours. Iron Studies: No results for input(s): IRON, TIBC, TRANSFERRIN, FERRITIN in the last 72 hours. Studies/Results: Ct Head Wo Contrast  Result Date: 02/17/2018 CLINICAL DATA:  Altered level of consciousness.  Found unresponsive. EXAM: CT HEAD WITHOUT CONTRAST TECHNIQUE: Contiguous axial images were obtained from the base of the skull through the vertex without intravenous contrast. COMPARISON:   Head CT 07/31/2017, brain MRI 11/01/2017 FINDINGS: Brain: Generalized atrophy and chronic small vessel ischemia, similar to prior exams. No intracranial hemorrhage, mass effect, or midline shift. No hydrocephalus. The basilar cisterns are patent. No evidence of territorial infarct or acute ischemia. No evidence of cerebral edema. No extra-axial or intracranial fluid collection. Vascular: No hyperdense vessel or unexpected calcification. Skull: No fracture or focal lesion. Sinuses/Orbits: Metallic density in the right globe anterior chamber is unchanged from prior exam. Bilateral cataract resection. Minimal metallic debris in the anterior scalp. Mild mucosal thickening of the ethmoid air cells and sphenoid sinus. Mastoid air cells are clear. Other: None. IMPRESSION: 1.  No acute intracranial abnormality. 2. Generalized atrophy and chronic small vessel ischemia. 3. Metallic density in the right globe, chronic and unchanged from prior exam. Electronically Signed   By: Jeb Levering M.D.   On: 02/17/2018 05:51   Ct Chest Wo Contrast  Result Date: 02/17/2018 CLINICAL DATA:  Found unresponsive. Post CPR. Acute resp illness, > 37 years old EXAM: CT CHEST WITHOUT CONTRAST TECHNIQUE: Multidetector CT imaging of the chest was performed following the standard protocol without IV contrast. COMPARISON:  Chest radiograph earlier this day. Additional radiographs and CT in a different patient jacket, most recent CT 11/25/2017 FINDINGS: Cardiovascular: Aortic atherosclerosis. Coronary artery calcifications. Mild multi chamber cardiomegaly. Minimal pericardial fluid. Mediastinum/Nodes: Scattered mediastinal nodes not enlarged by size criteria. Lack of IV contrast limits assessment for hilar adenopathy. Endotracheal and enteric tubes appropriately  positioned. Lungs/Pleura: Multifocal confluent, ground-glass, patchy and nodular opacities throughout both lungs. Consolidations in the dependent lungs appear confluent, and there is  debris within the distal trachea and right mainstem bronchi, suggesting aspiration. No definite septal thickening. No pleural fluid. Upper Abdomen: Tip and side port of the enteric tube in the stomach. Bilateral renal atrophy with hyperdense lesions in both kidneys, likely cysts but incompletely characterized. Musculoskeletal: No acute finding. There are no acute or suspicious osseous abnormalities. IMPRESSION: 1. Multifocal bilateral consolidative, ground-glass, nodular airspace opacities throughout both lungs. Minimal debris in the distal trachea and right mainstem bronchus. Overall findings suggest pneumonia with probable aspiration. Degree of pulmonary edema is also considered. Ground-glass opacities in a patient with HIV can be seen with pneumocystis pneumonia , however consolidative opacities are the prominent pattern. 2. Cardiomegaly with aortic atherosclerosis. Aortic Atherosclerosis (ICD10-I70.0). Electronically Signed   By: Jeb Levering M.D.   On: 02/17/2018 06:02   Dg Chest Port 1 View  Result Date: 02/18/2018 CLINICAL DATA:  Acute respiratory failure, hypoxia, intubated patient. History of CHF, renal insufficiency, HIV, and prostate malignancy. EXAM: PORTABLE CHEST 1 VIEW COMPARISON:  Chest x-ray and chest CT scan of February 17, 2018 FINDINGS: The lungs are adequately inflated. The interstitial markings remain increased and are more conspicuous today on the left. The cardiac silhouette remains enlarged. The pulmonary vascularity is not engorged. The endotracheal tube tip lies 6.4 cm above the carina. The esophagogastric tube tip and proximal port project below the GE junction. IMPRESSION: Persistent bilateral pneumonia. Slight increased opacity noted on the left today. No overt pulmonary edema. The support tubes are in reasonable position where visualized. Electronically Signed   By: David  Martinique M.D.   On: 02/18/2018 08:35   Dg Chest Portable 1 View  Result Date: 02/17/2018 CLINICAL DATA:   Shortness of breath. Intubation and orogastric tube placement. EXAM: PORTABLE CHEST 1 VIEW COMPARISON:  None. FINDINGS: Endotracheal tube 7.4 cm from the carina. Enteric tube in place, tip visualized in the stomach. The heart is enlarged. There are bilateral ill-defined perihilar opacities. Minimal Kerley B-lines in the periphery of the right lung. No large pleural effusion or evidence of pneumothorax. Lung apices not included in the field of view. IMPRESSION: 1. Endotracheal tube 7.4 cm from the carina. Enteric tube in place with tip visualized in the stomach. 2. Cardiomegaly. Bilateral ill-defined perihilar opacities, favoring pulmonary edema. Multifocal pneumonia or aspiration could have a similar appearance. Electronically Signed   By: Jeb Levering M.D.   On: 02/17/2018 03:45    Scheduled: . carvedilol  6.25 mg Oral BID WC  . chlorhexidine gluconate (MEDLINE KIT)  15 mL Mouth Rinse BID  . dolutegravir  50 mg Oral Daily  . emtricitabine-tenofovir AF  1 tablet Oral Daily  . feeding supplement (PRO-STAT SUGAR FREE 64)  30 mL Per Tube BID  . lidocaine      . mouth rinse  15 mL Mouth Rinse QID  . mupirocin ointment   Nasal BID  . pantoprazole (PROTONIX) IV  40 mg Intravenous QHS  . scopolamine  1 patch Transdermal Q72H    LOS: 1 day   Estanislado Emms 02/18/2018,4:28 PM

## 2018-02-18 NOTE — Progress Notes (Signed)
Attempted to call report. RN is with a new admission and will call back when she is finished with her new admission.

## 2018-02-18 NOTE — Progress Notes (Signed)
PULMONARY / CRITICAL CARE MEDICINE   Name: David [name] MRN: [id number] DOB: 1941/11/01    ADMISSION DATE:  02/17/2018 CONSULTATION DATE:  02/17/2018  REFERRING MD:  Dr. 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 Wife reports that patient has had a dry cough last several days however, other than that has been in usual state on health. This morning around 2-3 am patient woke up with severe shortness of breath asking to be taken to ED. Upon arrival to ED patient was noted to be agonal and pulseless in parking lot. CPR started and continued for estimated 5-10 minutes before return of ROSC. Intubated. CXR consistent with pulmonary edema. Creatinine 5.32, LA 4.44. PCCM asked to admit.   SUBJECTIVE:  No events overnight, arouses easily  VITAL SIGNS: BP (!) 88/52   Pulse 77   Temp 98.4 F (36.9 C) (Axillary)   Resp 20   Ht 6\' 2"  (1.88 m)   Wt 204 lb 2.3 oz (92.6 kg)   SpO2 100%   BMI 26.21 kg/m   HEMODYNAMICS:    VENTILATOR SETTINGS: Vent Mode: PRVC FiO2 (%):  [40 %] 40 % Set Rate:  [20 bmp] 20 bmp Vt Set:  [297 mL] 660 mL PEEP:  [5 cmH20] 5 cmH20 Plateau Pressure:  [20 LGX21-19 cmH20] 20 cmH20  INTAKE / OUTPUT: I/O last 3 completed shifts: In: 2037.1 [I.V.:689.1; NG/GT:1150; IV Piggyback:198] Out: 4174 [Urine:1856]  PHYSICAL EXAMINATION: General:  Adult male on vent, opens eyes and tracks Neuro:  Sedated, opens eyes and tracks to verbal stimulation, pupils intact  HEENT:  Miller/AT, PERRL, EOM-I and MMM Cardiovascular:  RRR, Nl S1/S2 and -M/R/G Lungs:  Decreased BS diffusely Abdomen:  Soft, NT, ND and +BS Musculoskeletal: 1+ edema and -tenderness Skin:  Warm, dry   LABS:  BMET Recent Labs  Lab  02/17/18 0300 02/17/18 0312 02/17/18 2207 02/18/18 0558  NA 139 140 137 136  K 3.8 3.7 3.3* 3.1*  CL 104 104 102 102  CO2 19*  --  24 20*  BUN 81* 76* 89* 96*  CREATININE 5.32* 5.30* 5.40* 5.46*  GLUCOSE 157* 150* 147* 147*   Electrolytes Recent Labs  Lab 02/17/18 0300 02/17/18 1020 02/17/18 1633 02/17/18 2207 02/18/18 0558  CALCIUM 8.8*  --   --  8.2* 8.2*  MG  --  1.9 1.9  --  2.1  PHOS  --  3.9 4.1  --  3.8   CBC Recent Labs  Lab 02/17/18 0300 02/17/18 0312 02/17/18 1020 02/18/18 0558  WBC 10.9*  --  9.7 10.8*  HGB 13.5 15.0 11.4* 10.1*  HCT 41.5 44.0 35.8* 31.0*  PLT 256  --  234 186   Coag's Recent Labs  Lab 02/17/18 0300  INR 0.98   Sepsis Markers Recent Labs  Lab 02/17/18 0311 02/17/18 0427 02/17/18 1020 02/18/18 0558  LATICACIDVEN 4.44* 2.2* 1.2  --   PROCALCITON  --  0.13  --  18.88   ABG Recent Labs  Lab 02/17/18 0413  PHART 7.286*  PCO2ART 44.9  PO2ART 103.0   Liver Enzymes Recent Labs  Lab 02/17/18 0300  AST 27  ALT 22  ALKPHOS 117  BILITOT 0.6  ALBUMIN 4.1   Cardiac Enzymes Recent Labs  Lab 02/17/18  0455 02/17/18 1020 02/17/18 1633  TROPONINI 0.08* 0.07* 0.07*   Glucose Recent Labs  Lab 02/17/18 1251 02/17/18 1701 02/17/18 2023 02/17/18 2356 02/18/18 0346 02/18/18 0813  GLUCAP 114* 115* 95 109* 117* 123*   Imaging Dg Chest Port 1 View  Result Date: 02/18/2018 CLINICAL DATA:  Acute respiratory failure, hypoxia, intubated patient. History of CHF, renal insufficiency, HIV, and prostate malignancy. EXAM: PORTABLE CHEST 1 VIEW COMPARISON:  Chest x-ray and chest CT scan of February 17, 2018 FINDINGS: The lungs are adequately inflated. The interstitial markings remain increased and are more conspicuous today on the left. The cardiac silhouette remains enlarged. The pulmonary vascularity is not engorged. The endotracheal tube tip lies 6.4 cm above the carina. The esophagogastric tube tip and proximal port project below the  GE junction. IMPRESSION: Persistent bilateral pneumonia. Slight increased opacity noted on the left today. No overt pulmonary edema. The support tubes are in reasonable position where visualized. Electronically Signed   By: David  Martinique M.D.   On: 02/18/2018 08:35   STUDIES:  CXR 3/17 > 1. Endotracheal tube 7.4 cm from the carina. Enteric tube in place with tip visualized in the stomach. 2. Cardiomegaly. Bilateral ill-defined perihilar opacities, favoring pulmonary edema. Multifocal pneumonia or aspiration could have a similar appearance. CT Chest 3/17 >> CT Head 3/17 >> ECHO 3/17 >>   CULTURES: Blood 3/17 >> Sputum 3/17 >> U/A 3/17 >>  ANTIBIOTICS: None   SIGNIFICANT EVENTS: 3/17 > Presents to ED   LINES/TUBES: ETT 3/17 >>  DISCUSSION: 77 y.o.malewith severe and sudden shortness of breath presents to ED on 3/17 agonal and pulseless for 5-10 minutes before return of ROSC. Intubated. PCCM asked to admit.   ASSESSMENT / PLAN:  PULMONARY A: Acute Hypoxic Respiratory Failure in setting of suspected decompensated heart failure, +/-viral illness  CXR with cardiomegaly and perihilar opacities concerning for pulmonary edema  P:   Continue full vent support, no weaning until at least a treatment of dialysis is done and some fluid is removed Adjust vent for ABG Pulmonary Hygiene  RVP all negative CT Chest Pending   CARDIOVASCULAR A:  PEA/Aystole Arrest 5-10 minutes  Decompensated Heart Failure  Nonischemic cardiomyopathy with EF 73-53% and diastolic dysfunction H/O HTN, HLD P:  Cardiac Monitoring  Maintain MAP >65  ECHO per cards Otherwise per cards Concern and risk for PE > ECHO and LE Dopplers pending   RENAL A:   Acute on Chronic Kidney Disease (Baseline Crt 4.6-4.9)  Crt 5.32 >  -Followed by Dr. Posey Pronto in office, plans for AVF placement  -Given 100 meq Lasix in ED  Anion Gap Metabolic Acidosis, Lactic Acidosis suspected secondary to hypoperfusion  P:   Trend  BMP  Replace electrolytes as indicated  HD once catheter is in place Appreciate input form renal  GASTROINTESTINAL A:   SUP  P:   TF per nutrition PPI   HEMATOLOGIC A:   Microcytic Anemia  HIV/AIDS Prostate Cancer s/p brachytherapy P:  Trend CBC  Maintain Hbg >7   INFECTIOUS A:   Afebrile, WBC 10.9  P:   Trend WBC and Fever Curve PAN Culture  Monitor off antibiotics, no overt evidence of infection  ENDOCRINE A:   Hyperglycemia    P:   Trend Glucose  SSI   NEUROLOGIC A:   Sedation needs -Tylenol and Salicylate level negative  H/O CVA P:   RASS goal: 0/-1 Wean Fentanyl gtt to achieve RASS PRN versed  CT Head negative  FAMILY  - Updates:  Family updated at bedside   - Inter-disciplinary family meet or Palliative Care meeting due by:  02/24/2018   The patient is critically ill with multiple organ systems failure and requires high complexity decision making for assessment and support, frequent evaluation and titration of therapies, application of advanced monitoring technologies and extensive interpretation of multiple databases.   Critical Care Time devoted to patient care services described in this note is  35  Minutes. This time reflects time of care of this signee Dr Jennet Maduro. This critical care time does not reflect procedure time, or teaching time or supervisory time of PA/NP/Med student/Med Resident etc but could involve care discussion time.  Rush Farmer, M.D. [facility] Pulmonary/Critical Care Medicine. Pager: [phone]. After hours pager: [phone].

## 2018-02-18 NOTE — Progress Notes (Signed)
DAILY PROGRESS NOTE   Patient Name: David [name] Date of Encounter: 02/18/2018  Chief Complaint   Intubated, lightly sedated  Patient Profile   David [name] is a 77 y.o. 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 Lake Bells.  Subjective   Intubated, lightly sedated. Opens eyes to voice. Seemed to recognize his wife at bedside. Plan for dialysis cath today. Echo is pending. Very mild troponin bump to 0.07 x 2.   Objective   Vitals:   02/18/18 0417 02/18/18 0500 02/18/18 0600 02/18/18 0700  BP:  (!) 82/61 (!) 85/63 (!) 87/66  Pulse: 71 72 71 71  Resp: _0 Temp:      TempSrc:      SpO2: 100% 100% 100% 100%  Weight:  204 lb 2.3 oz (92.6 kg)    Height:        Intake/Output Summary (Last 24 hours) at 02/18/2018 0811 Last data filed at 02/18/2018 0640 Gross per 24 hour  Intake 1939.68 ml  Output 1850 ml  Net 89.68 ml   Filed Weights   02/17/18 0546 02/18/18 0500  Weight: 206 lb 9.1 oz (93.7 kg) 204 lb 2.3 oz (92.6 kg)    Physical Exam   General appearance: intubated, lightly sedated on vent Neck: no carotid bruit, no JVD and thyroid not enlarged, symmetric, no tenderness/mass/nodules Lungs: clear to auscultation bilaterally Heart: regular rate and rhythm and occasional skipped beats Abdomen: soft, non-tender; bowel sounds normal; no masses,  no organomegaly Extremities: extremities normal, atraumatic, no cyanosis or edema Pulses: 2+ and symmetric Skin: Skin color, texture, turgor normal. No rashes or lesions Neurologic: Mental status: opens eyes to stimulation Psych: unable to assess  Inpatient Medications    Scheduled Meds: . carvedilol  6.25 mg Oral BID WC  . chlorhexidine gluconate (MEDLINE KIT)  15 mL Mouth Rinse BID  . dolutegravir  50 mg Oral Daily  .  emtricitabine-tenofovir AF  1 tablet Oral Daily  . feeding supplement (PRO-STAT SUGAR FREE 64)  30 mL Per Tube BID  . feeding supplement (VITAL HIGH PROTEIN)  1,000 mL Per Tube Q24H  . mouth rinse  15 mL Mouth Rinse QID  . mupirocin ointment   Nasal BID  . pantoprazole (PROTONIX) IV  40 mg Intravenous QHS  . scopolamine  1 patch Transdermal Q72H    Continuous Infusions: . sodium chloride 250 mL (02/17/18 0555)  . fentaNYL infusion INTRAVENOUS 75 mcg/hr (02/17/18 2000)  . furosemide Stopped (02/18/18 0157)  . propofol (DIPRIVAN) infusion 20 mcg/kg/min (02/18/18 0251)    PRN Meds: sodium chloride, fentaNYL, midazolam, midazolam   Labs   Results for orders placed or performed during the hospital encounter of 02/17/18 (from the past 48 hour(s))  CBC with Differential/Platelet     Status: Abnormal   Collection Time: 02/17/18  3:00 AM  Result Value Ref Range   WBC 10.9 (H) 4.0 - 10.5 K/uL   RBC 4.03 (L) 4.22 - 5.81 MIL/uL   Hemoglobin 13.5 13.0 - 17.0 g/dL   HCT 41.5 39.0 - 52.0 %   MCV 103.0 (H) 78.0 - 100.0 fL   MCH 33.5 26.0 - 34.0 pg   MCHC 32.5 30.0 - 36.0 g/dL   RDW 13.7 11.5 - 15.5 %   Platelets 256 150 - 400 K/uL  Neutrophils Relative % 32 %   Lymphocytes Relative 60 %   Monocytes Relative 4 %   Eosinophils Relative 4 %   Basophils Relative 0 %   Neutro Abs 3.5 1.7 - 7.7 K/uL   Lymphs Abs 6.6 (H) 0.7 - 4.0 K/uL   Monocytes Absolute 0.4 0.1 - 1.0 K/uL   Eosinophils Absolute 0.4 0.0 - 0.7 K/uL   Basophils Absolute 0.0 0.0 - 0.1 K/uL   WBC Morphology ABSOLUTE LYMPHOCYTOSIS     Comment: Performed at [facility] [address], [place], [place] [address]  Comprehensive metabolic panel     Status: Abnormal   Collection Time: 02/17/18  3:00 AM  Result Value Ref Range   Sodium 139 135 - 145 mmol/L   Potassium 3.8 3.5 - 5.1 mmol/L   Chloride 104 101 - 111 mmol/L   CO2 19 (L) 22 - 32 mmol/L   Glucose, Bld 157 (H) 65 - 99 mg/dL   BUN 81 (H) 6 - 20 mg/dL    Creatinine, Ser 5.32 (H) 0.61 - 1.24 mg/dL   Calcium 8.8 (L) 8.9 - 10.3 mg/dL   Total Protein 7.9 6.5 - 8.1 g/dL   Albumin 4.1 3.5 - 5.0 g/dL   AST 27 15 - 41 U/L   ALT 22 17 - 63 U/L   Alkaline Phosphatase 117 38 - 126 U/L   Total Bilirubin 0.6 0.3 - 1.2 mg/dL   GFR calc non Af Amer 9 (L) >60 mL/min   GFR calc Af Amer 11 (L) >60 mL/min    Comment: (NOTE) The eGFR has been calculated using the CKD EPI equation. This calculation has not been validated in all clinical situations. eGFR's persistently <60 mL/min signify possible Chronic Kidney Disease.    Anion gap 16 (H) 5 - 15    Comment: Performed at [facility], [place] [address], [place], [place] [address]  Brain natriuretic peptide     Status: Abnormal   Collection Time: 02/17/18  3:00 AM  Result Value Ref Range   B Natriuretic Peptide 800.9 (H) 0.0 - 100.0 pg/mL    Comment: Performed at [facility] [address], [place], [place] [address]  Protime-INR     Status: None   Collection Time: 02/17/18  3:00 AM  Result Value Ref Range   Prothrombin Time 12.8 11.4 - 15.2 seconds   INR 0.98     Comment: Performed at [facility] [address], [place], [place] [address]  Acetaminophen level     Status: Abnormal   Collection Time: 02/17/18  3:00 AM  Result Value Ref Range   Acetaminophen (Tylenol), Serum <10 (L) 10 - 30 ug/mL    Comment:        THERAPEUTIC CONCENTRATIONS VARY SIGNIFICANTLY. A RANGE OF 10-30 ug/mL MAY BE AN EFFECTIVE CONCENTRATION FOR MANY PATIENTS. HOWEVER, SOME ARE BEST TREATED AT CONCENTRATIONS OUTSIDE THIS RANGE. ACETAMINOPHEN CONCENTRATIONS >150 ug/mL AT 4 HOURS AFTER INGESTION AND >50 ug/mL AT 12 HOURS AFTER INGESTION ARE OFTEN ASSOCIATED WITH TOXIC REACTIONS. Performed at [facility], [place] [address] [place], [place] [address]   Salicylate level     Status: None   Collection Time: 02/17/18  3:00 AM  Result Value Ref Range   Salicylate Lvl <7.0 2.8 - 30.0 mg/dL     Comment: Performed at [facility] [address], [place], [place] [address]  CBG monitoring, ED     Status: Abnormal   Collection  Time: 02/17/18  3:06 AM  Result Value Ref Range   Glucose-Capillary 142 (H) 65 - 99 mg/dL  I-stat troponin, ED     Status: None   Collection Time: 02/17/18  3:09 AM  Result Value Ref Range   Troponin i, poc 0.05 0.00 - 0.08 ng/mL   Comment 3            Comment: Due to the release kinetics of cTnI, a negative result within the first hours of the onset of symptoms does not rule out myocardial infarction with certainty. If myocardial infarction is still suspected, repeat the test at appropriate intervals.   I-Stat CG4 Lactic Acid, ED     Status: Abnormal   Collection Time: 02/17/18  3:11 AM  Result Value Ref Range   Lactic Acid, Venous 4.44 (HH) 0.5 - 1.9 mmol/L   Comment NOTIFIED PHYSICIAN   I-stat chem 8, ed     Status: Abnormal   Collection Time: 02/17/18  3:12 AM  Result Value Ref Range   Sodium 140 135 - 145 mmol/L   Potassium 3.7 3.5 - 5.1 mmol/L   Chloride 104 101 - 111 mmol/L   BUN 76 (H) 6 - 20 mg/dL   Creatinine, Ser 5.30 (H) 0.61 - 1.24 mg/dL   Glucose, Bld 150 (H) 65 - 99 mg/dL   Calcium, Ion 1.11 (L) 1.15 - 1.40 mmol/L   TCO2 23 22 - 32 mmol/L   Hemoglobin 15.0 13.0 - 17.0 g/dL   HCT 44.0 39.0 - 52.0 %  I-Stat Arterial Blood Gas, ED - (order at [facility] and [facility] only)     Status: Abnormal   Collection Time: 02/17/18  4:13 AM  Result Value Ref Range   pH, Arterial 7.286 (L) 7.350 - 7.450   pCO2 arterial 44.9 32.0 - 48.0 mmHg   pO2, Arterial 103.0 83.0 - 108.0 mmHg   Bicarbonate 21.6 20.0 - 28.0 mmol/L   TCO2 23 22 - 32 mmol/L   O2 Saturation 97.0 %   Acid-base deficit 5.0 (H) 0.0 - 2.0 mmol/L   Patient temperature 97.1 F    Collection site RADIAL, ALLEN'S TEST ACCEPTABLE    Drawn by Operator    Sample type ARTERIAL   Procalcitonin - Baseline     Status: None   Collection Time: 02/17/18  4:27 AM  Result Value Ref Range    Procalcitonin 0.13 ng/mL    Comment:        Interpretation: PCT (Procalcitonin) <= 0.5 ng/mL: Systemic infection (sepsis) is not likely. Local bacterial infection is possible. (NOTE)       Sepsis PCT Algorithm           Lower Respiratory Tract                                      Infection PCT Algorithm    ----------------------------     ----------------------------         PCT < 0.25 ng/mL                PCT < 0.10 ng/mL         Strongly encourage             Strongly discourage   discontinuation of antibiotics    initiation of antibiotics    ----------------------------     -----------------------------       PCT 0.25 - 0.50 ng/mL  PCT 0.10 - 0.25 ng/mL               OR       >80% decrease in PCT            Discourage initiation of                                            antibiotics      Encourage discontinuation           of antibiotics    ----------------------------     -----------------------------         PCT >= 0.50 ng/mL              PCT 0.26 - 0.50 ng/mL               AND        <80% decrease in PCT             Encourage initiation of                                             antibiotics       Encourage continuation           of antibiotics    ----------------------------     -----------------------------        PCT >= 0.50 ng/mL                  PCT > 0.50 ng/mL               AND         increase in PCT                  Strongly encourage                                      initiation of antibiotics    Strongly encourage escalation           of antibiotics                                     -----------------------------                                           PCT <= 0.25 ng/mL                                                 OR                                        > 80% decrease in PCT  Discontinue / Do not initiate                                             antibiotics Performed at [facility],  [place] [address] [place], [place] [address]   Lactic acid, plasma     Status: Abnormal   Collection Time: 02/17/18  4:27 AM  Result Value Ref Range   Lactic Acid, Venous 2.2 (HH) 0.5 - 1.9 mmol/L    Comment: CRITICAL RESULT CALLED TO, READ BACK BY AND VERIFIED WITH: STOWE [facility] 02/17/18 0524 WAYK Performed at [facility], [place] [address] [place], [place] [address]   Troponin I (q 6hr x 3)     Status: Abnormal   Collection Time: 02/17/18  4:55 AM  Result Value Ref Range   Troponin I 0.08 (HH) <0.03 ng/mL    Comment: CRITICAL RESULT CALLED TO, READ BACK BY AND VERIFIED WITH: STOWE S,RN 02/17/18 0533 WAYK Performed at [facility], [place] [address], [place], [place] [address]   Urine rapid drug screen (hosp performed)     Status: None   Collection Time: 02/17/18  5:40 AM  Result Value Ref Range   Opiates NONE DETECTED NONE DETECTED   Cocaine NONE DETECTED NONE DETECTED   Benzodiazepines NONE DETECTED NONE DETECTED   Amphetamines NONE DETECTED NONE DETECTED   Tetrahydrocannabinol NONE DETECTED NONE DETECTED   Barbiturates NONE DETECTED NONE DETECTED    Comment: (NOTE) DRUG SCREEN FOR MEDICAL PURPOSES ONLY.  IF CONFIRMATION IS NEEDED FOR ANY PURPOSE, NOTIFY LAB WITHIN 5 DAYS. LOWEST DETECTABLE LIMITS FOR URINE DRUG SCREEN Drug Class                     Cutoff (ng/mL) Amphetamine and metabolites    1000 Barbiturate and metabolites    200 Benzodiazepine                 885 Tricyclics and metabolites     300 Opiates and metabolites        300 Cocaine and metabolites        300 THC                            50 Performed at [facility], [place] [address], [place], [place] [address]   Urinalysis, Routine w reflex microscopic     Status: Abnormal   Collection Time: 02/17/18  5:40 AM  Result Value Ref Range   Color, Urine YELLOW YELLOW   APPearance CLOUDY (A) CLEAR   Specific Gravity, Urine 1.010 1.005 - 1.030   pH 5.0 5.0 - 8.0   Glucose, UA NEGATIVE NEGATIVE mg/dL     Hgb urine dipstick LARGE (A) NEGATIVE   Bilirubin Urine NEGATIVE NEGATIVE   Ketones, ur NEGATIVE NEGATIVE mg/dL   Protein, ur 100 (A) NEGATIVE mg/dL   Nitrite NEGATIVE NEGATIVE   Leukocytes, UA NEGATIVE NEGATIVE   RBC / HPF TOO NUMEROUS TO COUNT 0 - 5 RBC/hpf   WBC, UA NONE SEEN 0 - 5 WBC/hpf   Bacteria, UA RARE (A) NONE SEEN   Squamous Epithelial / LPF 0-5 (A) NONE SEEN   WBC Clumps PRESENT    Hyaline Casts, UA PRESENT     Comment: Performed at [facility], [place] [address], [place], [place] [address]  Respiratory Panel by PCR     Status: None   Collection Time: 02/17/18  5:40 AM  Result Value Ref Range   Adenovirus NOT DETECTED NOT DETECTED   Coronavirus 229E NOT DETECTED NOT DETECTED   Coronavirus HKU1 NOT DETECTED NOT DETECTED   Coronavirus NL63 NOT DETECTED NOT DETECTED   Coronavirus OC43 NOT DETECTED NOT DETECTED   Metapneumovirus NOT DETECTED NOT DETECTED   Rhinovirus / Enterovirus NOT DETECTED NOT DETECTED   Influenza A NOT DETECTED NOT DETECTED   Influenza B NOT DETECTED NOT DETECTED   Parainfluenza Virus 1 NOT DETECTED NOT DETECTED   Parainfluenza Virus 2 NOT DETECTED NOT DETECTED   Parainfluenza Virus 3 NOT DETECTED NOT DETECTED   Parainfluenza Virus 4 NOT DETECTED NOT DETECTED   Respiratory Syncytial Virus NOT DETECTED NOT DETECTED   Bordetella pertussis NOT DETECTED NOT DETECTED   Chlamydophila pneumoniae NOT DETECTED NOT DETECTED   Mycoplasma pneumoniae NOT DETECTED NOT DETECTED    Comment: Performed at [facility] [address], [place], [place] [address]  MRSA PCR Screening     Status: Abnormal   Collection Time: 02/17/18  5:40 AM  Result Value Ref Range   MRSA by PCR POSITIVE (A) NEGATIVE    Comment:        The GeneXpert MRSA Assay (FDA approved for NASAL specimens only), is one component of a comprehensive MRSA colonization surveillance program. It is not intended to diagnose MRSA infection nor to guide or monitor treatment for MRSA  infections. RESULT CALLED TO, READ BACK BY AND VERIFIED WITH: KEISHA @ 1517 ON 02/17/18 BY ROBINSON Z.   Culture, blood (routine x 2)     Status: None (Preliminary result)   Collection Time: 02/17/18  6:09 AM  Result Value Ref Range   Specimen Description BLOOD LEFT HAND    Special Requests      BOTTLES DRAWN AEROBIC ONLY Blood Culture results may not be optimal due to an inadequate volume of blood received in culture bottles Performed at [facility] [address], [place], [place] [address]    Culture PENDING    Report Status PENDING   Ethanol     Status: None   Collection Time: 02/17/18  6:09 AM  Result Value Ref Range   Alcohol, Ethyl (B) <10 <10 mg/dL    Comment:        LOWEST DETECTABLE LIMIT FOR SERUM ALCOHOL IS 10 mg/dL FOR MEDICAL PURPOSES ONLY Performed at [facility], [place] [address], [place], [place] [address]   Glucose, capillary     Status: Abnormal   Collection Time: 02/17/18  6:56 AM  Result Value Ref Range   Glucose-Capillary 110 (H) 65 - 99 mg/dL   Comment 1 Capillary Specimen    Comment 2 Notify RN   Lactic acid, plasma     Status: None   Collection Time: 02/17/18 10:20 AM  Result Value Ref Range   Lactic Acid, Venous 1.2 0.5 - 1.9 mmol/L    Comment: Performed at [facility], [place] [address] [place], [place] [address]  Troponin I (q 6hr x 3)     Status: Abnormal   Collection Time: 02/17/18 10:20 AM  Result Value Ref Range   Troponin I 0.07 (HH) <0.03 ng/mL    Comment: CRITICAL VALUE NOTED.  VALUE IS CONSISTENT WITH PREVIOUSLY REPORTED AND CALLED VALUE. Performed at [facility], [place] [address], [place], [place] [address]   Lactate dehydrogenase  Status: None   Collection Time: 02/17/18 10:20 AM  Result Value Ref Range   LDH 184 98 - 192 U/L    Comment: Performed at [facility], [place] [address], [place], [place] [address]  Triglycerides     Status: None   Collection Time: 02/17/18 10:20 AM  Result Value Ref Range    Triglycerides 82 <150 mg/dL    Comment: Performed at [facility] [address], [place], [place] [address]  CBC with Differential/Platelet     Status: Abnormal   Collection Time: 02/17/18 10:20 AM  Result Value Ref Range   WBC 9.7 4.0 - 10.5 K/uL    Comment: WHITE COUNT CONFIRMED ON SMEAR   RBC 3.58 (L) 4.22 - 5.81 MIL/uL   Hemoglobin 11.4 (L) 13.0 - 17.0 g/dL    Comment: REPEATED TO VERIFY DELTA CHECK NOTED    HCT 35.8 (L) 39.0 - 52.0 %   MCV 100.0 78.0 - 100.0 fL   MCH 31.8 26.0 - 34.0 pg   MCHC 31.8 30.0 - 36.0 g/dL   RDW 13.6 11.5 - 15.5 %   Platelets 234 150 - 400 K/uL   Neutrophils Relative % 88 %   Lymphocytes Relative 7 %   Monocytes Relative 5 %   Eosinophils Relative 0 %   Basophils Relative 0 %   Neutro Abs 8.5 (H) 1.7 - 7.7 K/uL   Lymphs Abs 0.7 0.7 - 4.0 K/uL   Monocytes Absolute 0.5 0.1 - 1.0 K/uL   Eosinophils Absolute 0.0 0.0 - 0.7 K/uL   Basophils Absolute 0.0 0.0 - 0.1 K/uL    Comment: Performed at [facility] [address] [place], [place] [address]  Magnesium     Status: None   Collection Time: 02/17/18 10:20 AM  Result Value Ref Range   Magnesium 1.9 1.7 - 2.4 mg/dL    Comment: Performed at [facility] [address] [place], [place] [address]  Phosphorus     Status: None   Collection Time: 02/17/18 10:20 AM  Result Value Ref Range   Phosphorus 3.9 2.5 - 4.6 mg/dL    Comment: Performed at [facility] [address] [place], [place] [address]  Glucose, capillary     Status: Abnormal   Collection Time: 02/17/18 12:51 PM  Result Value Ref Range   Glucose-Capillary 114 (H) 65 - 99 mg/dL   Comment 1 Capillary Specimen   Troponin I (q 6hr x 3)     Status: Abnormal   Collection Time: 02/17/18  4:33 PM  Result Value Ref Range   Troponin I 0.07 (HH) <0.03 ng/mL    Comment: CRITICAL VALUE NOTED.  VALUE IS CONSISTENT WITH PREVIOUSLY REPORTED AND CALLED VALUE. Performed at [facility], [place] [address],  [place], [place] [address]   Magnesium     Status: None   Collection Time: 02/17/18  4:33 PM  Result Value Ref Range   Magnesium 1.9 1.7 - 2.4 mg/dL    Comment: Performed at [facility], [place] [address] [place], [place] [address]  Phosphorus     Status: None   Collection Time: 02/17/18  4:33 PM  Result Value Ref Range   Phosphorus 4.1 2.5 - 4.6 mg/dL    Comment: Performed at [facility] [address] [place], [place] [address]  Glucose, capillary     Status: Abnormal   Collection Time: 02/17/18  5:01 PM  Result Value Ref  Range   Glucose-Capillary 115 (H) 65 - 99 mg/dL   Comment 1 Capillary Specimen   Glucose, capillary     Status: None   Collection Time: 02/17/18  8:23 PM  Result Value Ref Range   Glucose-Capillary 95 65 - 99 mg/dL   Comment 1 Capillary Specimen    Comment 2 Notify RN   Basic metabolic panel     Status: Abnormal   Collection Time: 02/17/18 10:07 PM  Result Value Ref Range   Sodium 137 135 - 145 mmol/L   Potassium 3.3 (L) 3.5 - 5.1 mmol/L   Chloride 102 101 - 111 mmol/L   CO2 24 22 - 32 mmol/L   Glucose, Bld 147 (H) 65 - 99 mg/dL   BUN 89 (H) 6 - 20 mg/dL   Creatinine, Ser 5.40 (H) 0.61 - 1.24 mg/dL   Calcium 8.2 (L) 8.9 - 10.3 mg/dL   GFR calc non Af Amer 9 (L) >60 mL/min   GFR calc Af Amer 11 (L) >60 mL/min    Comment: (NOTE) The eGFR has been calculated using the CKD EPI equation. This calculation has not been validated in all clinical situations. eGFR's persistently <60 mL/min signify possible Chronic Kidney Disease.    Anion gap 11 5 - 15    Comment: Performed at [facility] [address] [place], [place] [address]  Glucose, capillary     Status: Abnormal   Collection Time: 02/17/18 11:56 PM  Result Value Ref Range   Glucose-Capillary 109 (H) 65 - 99 mg/dL   Comment 1 Capillary Specimen    Comment 2 Notify RN   Glucose, capillary     Status: Abnormal   Collection Time: 02/18/18  3:46 AM  Result Value Ref Range    Glucose-Capillary 117 (H) 65 - 99 mg/dL   Comment 1 Capillary Specimen    Comment 2 Notify RN   Procalcitonin     Status: None   Collection Time: 02/18/18  5:58 AM  Result Value Ref Range   Procalcitonin 18.88 ng/mL    Comment:        Interpretation: PCT >= 10 ng/mL: Important systemic inflammatory response, almost exclusively due to severe bacterial sepsis or septic shock. (NOTE)       Sepsis PCT Algorithm           Lower Respiratory Tract                                      Infection PCT Algorithm    ----------------------------     ----------------------------         PCT < 0.25 ng/mL                PCT < 0.10 ng/mL         Strongly encourage             Strongly discourage   discontinuation of antibiotics    initiation of antibiotics    ----------------------------     -----------------------------       PCT 0.25 - 0.50 ng/mL            PCT 0.10 - 0.25 ng/mL               OR       >80% decrease in PCT            Discourage initiation of  antibiotics      Encourage discontinuation           of antibiotics    ----------------------------     -----------------------------         PCT >= 0.50 ng/mL              PCT 0.26 - 0.50 ng/mL                AND       <80% decrease in PCT             Encourage initiation of                                             antibiotics       Encourage continuation           of antibiotics    ----------------------------     -----------------------------        PCT >= 0.50 ng/mL                  PCT > 0.50 ng/mL               AND         increase in PCT                  Strongly encourage                                      initiation of antibiotics    Strongly encourage escalation           of antibiotics                                     -----------------------------                                           PCT <= 0.25 ng/mL                                                 OR                                         > 80% decrease in PCT                                     Discontinue / Do not initiate                                             antibiotics Performed at [facility], [place] [address] [place], [place] [address]   CBC     Status: Abnormal   Collection Time: 02/18/18  5:58 AM  Result Value Ref Range   WBC 10.8 (H) 4.0 - 10.5 K/uL   RBC 3.12 (L) 4.22 - 5.81 MIL/uL   Hemoglobin 10.1 (L) 13.0 - 17.0 g/dL   HCT 31.0 (L) 39.0 - 52.0 %   MCV 99.4 78.0 - 100.0 fL   MCH 32.4 26.0 - 34.0 pg   MCHC 32.6 30.0 - 36.0 g/dL   RDW 14.2 11.5 - 15.5 %   Platelets 186 150 - 400 K/uL    Comment: Performed at [facility] [address] [place], [place] [address]  Magnesium     Status: None   Collection Time: 02/18/18  5:58 AM  Result Value Ref Range   Magnesium 2.1 1.7 - 2.4 mg/dL    Comment: Performed at [facility] [address], [place], [place] [address]  Phosphorus     Status: None   Collection Time: 02/18/18  5:58 AM  Result Value Ref Range   Phosphorus 3.8 2.5 - 4.6 mg/dL    Comment: Performed at [facility] [address], [place], [place] [address]  Basic metabolic panel     Status: Abnormal   Collection Time: 02/18/18  5:58 AM  Result Value Ref Range   Sodium 136 135 - 145 mmol/L   Potassium 3.1 (L) 3.5 - 5.1 mmol/L   Chloride 102 101 - 111 mmol/L   CO2 20 (L) 22 - 32 mmol/L   Glucose, Bld 147 (H) 65 - 99 mg/dL   BUN 96 (H) 6 - 20 mg/dL   Creatinine, Ser 5.46 (H) 0.61 - 1.24 mg/dL   Calcium 8.2 (L) 8.9 - 10.3 mg/dL   GFR calc non Af Amer 9 (L) >60 mL/min   GFR calc Af Amer 11 (L) >60 mL/min    Comment: (NOTE) The eGFR has been calculated using the CKD EPI equation. This calculation has not been validated in all clinical situations. eGFR's persistently <60 mL/min signify possible Chronic Kidney Disease.    Anion gap 14 5 - 15    Comment: Performed at [facility] [address], [place], [place] [address]  PSA      Status: None   Collection Time: 02/18/18  5:58 AM  Result Value Ref Range   Prostatic Specific Antigen 0.01 0.00 - 4.00 ng/mL    Comment: (NOTE) While PSA levels of <=4.0 ng/ml are reported as reference range, some men with levels below 4.0 ng/ml can have prostate cancer and many men with PSA above 4.0 ng/ml do not have prostate cancer.  Other tests such as free PSA, age specific reference ranges, PSA velocity and PSA doubling time may be helpful especially in men less than 19 years old. Performed at [facility], [place] [address] [place], [place] [address]     ECG   N/A  Telemetry   NSR with PVC's - Personally Reviewed  Radiology    Ct Head Wo Contrast  Result Date: 02/17/2018 CLINICAL DATA:  Altered level of consciousness.  Found unresponsive. EXAM: CT HEAD WITHOUT CONTRAST TECHNIQUE: Contiguous axial images were obtained from the base of the skull through the vertex without intravenous contrast. COMPARISON:  Head CT 07/31/2017, brain MRI 11/01/2017 FINDINGS: Brain: Generalized atrophy and chronic small vessel ischemia, similar to prior exams. No intracranial hemorrhage, mass effect, or midline shift. No hydrocephalus. The basilar cisterns are patent. No evidence of territorial infarct or acute ischemia. No evidence of cerebral edema. No extra-axial or intracranial fluid collection.  Vascular: No hyperdense vessel or unexpected calcification. Skull: No fracture or focal lesion. Sinuses/Orbits: Metallic density in the right globe anterior chamber is unchanged from prior exam. Bilateral cataract resection. Minimal metallic debris in the anterior scalp. Mild mucosal thickening of the ethmoid air cells and sphenoid sinus. Mastoid air cells are clear. Other: None. IMPRESSION: 1.  No acute intracranial abnormality. 2. Generalized atrophy and chronic small vessel ischemia. 3. Metallic density in the right globe, chronic and unchanged from prior exam. Electronically Signed   By: Jeb Levering M.D.   On: 02/17/2018 05:51   Ct Chest Wo Contrast  Result Date: 02/17/2018 CLINICAL DATA:  Found unresponsive. Post CPR. Acute resp illness, > 81 years old EXAM: CT CHEST WITHOUT CONTRAST TECHNIQUE: Multidetector CT imaging of the chest was performed following the standard protocol without IV contrast. COMPARISON:  Chest radiograph earlier this day. Additional radiographs and CT in a different patient jacket, most recent CT 11/25/2017 FINDINGS: Cardiovascular: Aortic atherosclerosis. Coronary artery calcifications. Mild multi chamber cardiomegaly. Minimal pericardial fluid. Mediastinum/Nodes: Scattered mediastinal nodes not enlarged by size criteria. Lack of IV contrast limits assessment for hilar adenopathy. Endotracheal and enteric tubes appropriately positioned. Lungs/Pleura: Multifocal confluent, ground-glass, patchy and nodular opacities throughout both lungs. Consolidations in the dependent lungs appear confluent, and there is debris within the distal trachea and right mainstem bronchi, suggesting aspiration. No definite septal thickening. No pleural fluid. Upper Abdomen: Tip and side port of the enteric tube in the stomach. Bilateral renal atrophy with hyperdense lesions in both kidneys, likely cysts but incompletely characterized. Musculoskeletal: No acute finding. There are no acute or suspicious osseous abnormalities. IMPRESSION: 1. Multifocal bilateral consolidative, ground-glass, nodular airspace opacities throughout both lungs. Minimal debris in the distal trachea and right mainstem bronchus. Overall findings suggest pneumonia with probable aspiration. Degree of pulmonary edema is also considered. Ground-glass opacities in a patient with HIV can be seen with pneumocystis pneumonia , however consolidative opacities are the prominent pattern. 2. Cardiomegaly with aortic atherosclerosis. Aortic Atherosclerosis (ICD10-I70.0). Electronically Signed   By: Jeb Levering M.D.   On: 02/17/2018  06:02   Dg Chest Portable 1 View  Result Date: 02/17/2018 CLINICAL DATA:  Shortness of breath. Intubation and orogastric tube placement. EXAM: PORTABLE CHEST 1 VIEW COMPARISON:  None. FINDINGS: Endotracheal tube 7.4 cm from the carina. Enteric tube in place, tip visualized in the stomach. The heart is enlarged. There are bilateral ill-defined perihilar opacities. Minimal Kerley B-lines in the periphery of the right lung. No large pleural effusion or evidence of pneumothorax. Lung apices not included in the field of view. IMPRESSION: 1. Endotracheal tube 7.4 cm from the carina. Enteric tube in place with tip visualized in the stomach. 2. Cardiomegaly. Bilateral ill-defined perihilar opacities, favoring pulmonary edema. Multifocal pneumonia or aspiration could have a similar appearance. Electronically Signed   By: Jeb Levering M.D.   On: 02/17/2018 03:45    Cardiac Studies   Echo pending  Assessment   1. Active Problems: 2.   Respiratory arrest ([place]) 3.   Acute pulmonary edema (HCC) 4.   Acute on chronic systolic heart failure ([place]) 5.   Chronic obstructive pulmonary disease ([place]) 6.   HIV (human immunodeficiency virus infection) ([place]) 7.   Cardiac arrest ([place]) 8.   Acute on chronic systolic and diastolic heart failure, NYHA class 4 ([place]) 9.   ESRD (end stage renal disease) ([place]) 10.   Plan   1. Cardiac arrest - likely secondary to hypoxia in the setting of pulmonary edema  from ESRD. Plan for dialysis cath today. Echo pending. Will need further ischemic evaluation at some point once he has recovered. Appears euvolemic at this time.  Time Spent Directly with Patient:  I have spent a total of 35 minutes with the patient reviewing hospital notes, telemetry, EKGs, labs and examining the patient as well as establishing an assessment and plan that was discussed personally with the patient. > 50% of time was spent in direct patient care.  Length of Stay:  LOS: 1 day   Pixie Casino,  MD, [facility], [place] Director of the [facility] Lipid Disorders &  Cardiovascular Risk Reduction Clinic Diplomate of the American Board of Clinical Lipidology Attending Cardiologist  Direct Dial: [phone]  Fax: [phone]  Website:  [email or web address]  Nadean Corwin Hilty 02/18/2018, 8:11 AM

## 2018-02-19 ENCOUNTER — Inpatient Hospital Stay (HOSPITAL_COMMUNITY): Payer: Medicare HMO

## 2018-02-19 DIAGNOSIS — I469 Cardiac arrest, cause unspecified: Secondary | ICD-10-CM

## 2018-02-19 DIAGNOSIS — J9601 Acute respiratory failure with hypoxia: Secondary | ICD-10-CM

## 2018-02-19 DIAGNOSIS — N186 End stage renal disease: Secondary | ICD-10-CM

## 2018-02-19 DIAGNOSIS — J810 Acute pulmonary edema: Secondary | ICD-10-CM

## 2018-02-19 LAB — GLUCOSE, CAPILLARY
GLUCOSE-CAPILLARY: 113 mg/dL — AB (ref 65–99)
GLUCOSE-CAPILLARY: 113 mg/dL — AB (ref 65–99)
GLUCOSE-CAPILLARY: 121 mg/dL — AB (ref 65–99)
GLUCOSE-CAPILLARY: 127 mg/dL — AB (ref 65–99)
GLUCOSE-CAPILLARY: 90 mg/dL (ref 65–99)
Glucose-Capillary: 107 mg/dL — ABNORMAL HIGH (ref 65–99)
Glucose-Capillary: 111 mg/dL — ABNORMAL HIGH (ref 65–99)

## 2018-02-19 LAB — BLOOD GAS, ARTERIAL
Acid-Base Excess: 0.6 mmol/L (ref 0.0–2.0)
Bicarbonate: 23.1 mmol/L (ref 20.0–28.0)
Drawn by: 41422
FIO2: 40
MECHVT: 660 mL
O2 SAT: 99.3 %
PCO2 ART: 27.8 mmHg — AB (ref 32.0–48.0)
PEEP: 5 cmH2O
PH ART: 7.531 — AB (ref 7.350–7.450)
PO2 ART: 156 mmHg — AB (ref 83.0–108.0)
Patient temperature: 99.1
RATE: 20 resp/min

## 2018-02-19 LAB — BASIC METABOLIC PANEL
Anion gap: 14 (ref 5–15)
BUN: 71 mg/dL — ABNORMAL HIGH (ref 6–20)
CO2: 24 mmol/L (ref 22–32)
Calcium: 8.6 mg/dL — ABNORMAL LOW (ref 8.9–10.3)
Chloride: 101 mmol/L (ref 101–111)
Creatinine, Ser: 4.34 mg/dL — ABNORMAL HIGH (ref 0.61–1.24)
GFR calc non Af Amer: 12 mL/min — ABNORMAL LOW (ref >60)
GFR, EST AFRICAN AMERICAN: 14 mL/min — AB (ref >60)
Glucose, Bld: 116 mg/dL — ABNORMAL HIGH (ref 65–99)
Potassium: 3.4 mmol/L — ABNORMAL LOW (ref 3.5–5.1)
SODIUM: 139 mmol/L (ref 135–145)

## 2018-02-19 LAB — PHOSPHORUS: PHOSPHORUS: 3.7 mg/dL (ref 2.5–4.6)

## 2018-02-19 LAB — MAGNESIUM: Magnesium: 2.2 mg/dL (ref 1.7–2.4)

## 2018-02-19 LAB — CBC
HEMATOCRIT: 33.6 % — AB (ref 39.0–52.0)
Hemoglobin: 11.1 g/dL — ABNORMAL LOW (ref 13.0–17.0)
MCH: 33.2 pg (ref 26.0–34.0)
MCHC: 33 g/dL (ref 30.0–36.0)
MCV: 100.6 fL — ABNORMAL HIGH (ref 78.0–100.0)
Platelets: 189 10*3/uL (ref 150–400)
RBC: 3.34 MIL/uL — ABNORMAL LOW (ref 4.22–5.81)
RDW: 14.4 % (ref 11.5–15.5)
WBC: 10.7 10*3/uL — ABNORMAL HIGH (ref 4.0–10.5)

## 2018-02-19 LAB — PROCALCITONIN: PROCALCITONIN: 17.98 ng/mL

## 2018-02-19 LAB — PARATHYROID HORMONE, INTACT (NO CA): PTH: 133 pg/mL — AB (ref 15–65)

## 2018-02-19 MED ORDER — CARVEDILOL 6.25 MG PO TABS
6.2500 mg | ORAL_TABLET | Freq: Two times a day (BID) | ORAL | Status: AC
  Administered 2018-02-19 (×2): 6.25 mg via ORAL
  Filled 2018-02-19 (×2): qty 1

## 2018-02-19 MED ORDER — SODIUM CHLORIDE 0.9% FLUSH: 10.0000 mL | INTRAVENOUS | Status: DC | PRN

## 2018-02-19 MED ORDER — HEPARIN SODIUM (PORCINE) 5000 UNIT/ML IJ SOLN
5000.0000 [IU] | Freq: Three times a day (TID) | INTRAMUSCULAR | Status: DC
  Administered 2018-02-19 – 2018-02-26 (×20): 5000 [IU] via SUBCUTANEOUS
  Filled 2018-02-19 (×20): qty 1

## 2018-02-19 MED ORDER — ORAL CARE MOUTH RINSE
15.0000 mL | Freq: Two times a day (BID) | OROMUCOSAL | Status: DC
  Administered 2018-02-20 – 2018-03-08 (×19): 15 mL via OROMUCOSAL

## 2018-02-19 MED ORDER — CARVEDILOL 6.25 MG PO TABS
6.2500 mg | ORAL_TABLET | Freq: Two times a day (BID) | ORAL | Status: DC
  Administered 2018-02-20 – 2018-03-02 (×15): 6.25 mg via ORAL
  Filled 2018-02-19 (×18): qty 1

## 2018-02-19 MED ORDER — SODIUM CHLORIDE 0.9% FLUSH
10.0000 mL | Freq: Two times a day (BID) | INTRAVENOUS | Status: DC
  Administered 2018-02-20 – 2018-03-01 (×11): 10 mL

## 2018-02-19 MED ORDER — MIDODRINE HCL 5 MG PO TABS
5.0000 mg | ORAL_TABLET | Freq: Three times a day (TID) | ORAL | Status: DC
  Filled 2018-02-19: qty 1

## 2018-02-19 MED ORDER — CHLORHEXIDINE GLUCONATE CLOTH 2 % EX PADS
6.0000 | MEDICATED_PAD | Freq: Every day | CUTANEOUS | Status: DC
  Administered 2018-02-20 – 2018-03-07 (×12): 6 via TOPICAL

## 2018-02-19 NOTE — Consult Note (Signed)
         David [name] for Infectious Disease    Date of Admission:  02/17/2018     Patient Active Problem List   Diagnosis Date Noted  . Cardiac arrest ([place]) 02/17/2018    Priority: High  . HIV (human immunodeficiency virus infection) ([place])     Priority: High  . ESRD (end stage renal disease) ([place])     Priority: High  . Acute respiratory failure with hypoxemia ([place])   . Respiratory arrest ([place])   . Acute pulmonary edema (HCC)   . Acute on chronic systolic heart failure ([place])   . Chronic obstructive pulmonary disease ([place])   . Acute on chronic systolic and diastolic heart failure, NYHA class 4 (HCC)    Dr. Nelda Marseille asked me to make recommendations about [name] antiretroviral medications.  Apparently he had been on Descovy and Tivicay prior to this admission.  He had chronic kidney disease prior to admission.  He suffered out of hospital cardiac arrest and now has end-stage renal disease.  Published guidelines suggest separating the 2 components of Descovy (emtricitabine and tenofovir alafenamide) in 2 separate, renally adjusted doses along with full dose Tivicay.  However some recent data presented by Dr. Francine Graven at [facility] showed that full dose therapy is probably effective, safe and more convenient than a multidrug regimen.  I favor continuing [place] now.  Please call me if we can be of further assistance while he is here.     Presented March 2018 at the Conference on Retroviruses an Opportunistic Infections               Michel Bickers, MD [facility] for [place] [phone] pager   [phone] cell 02/19/2018, 11:56 AM

## 2018-02-19 NOTE — Progress Notes (Signed)
PULMONARY / CRITICAL CARE MEDICINE   Name: David [name] MRN: [id number] DOB: 05/08/1941    ADMISSION DATE:  02/17/2018 CONSULTATION DATE:  02/17/2018  REFERRING MD:  Dr. 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 Wife reports that patient has had a dry cough last several days however, other than that has been in usual state on health. This morning around 2-3 am patient woke up with severe shortness of breath asking to be taken to ED. Upon arrival to ED patient was noted to be agonal and pulseless in parking lot. CPR started and continued for estimated 5-10 minutes before return of ROSC. Intubated. CXR consistent with pulmonary edema. Creatinine 5.32, LA 4.44. PCCM asked to admit.   SUBJECTIVE:  No events overnight, no new complaints Tolerated HD yesterday  VITAL SIGNS: BP 125/79   Pulse 87   Temp 98.9 F (37.2 C) (Oral)   Resp 17   Ht 6\' 2"  (1.88 m)   Wt 198 lb 6.6 oz (90 kg)   SpO2 100%   BMI 25.47 kg/m   HEMODYNAMICS:    VENTILATOR SETTINGS: Vent Mode: CPAP;PSV FiO2 (%):  [40 %] 40 % Set Rate:  [20 bmp] 20 bmp Vt Set:  [119 mL] 660 mL PEEP:  [5 cmH20] 5 cmH20 Pressure Support:  [8 cmH20] 8 cmH20 Plateau Pressure:  [18 cmH20-22 cmH20] 22 cmH20  INTAKE / OUTPUT: I/O last 3 completed shifts: In: 3353.5 [I.V.:1033.5; NG/GT:1890; IV JYNWGNFAO:130] Out: 8657 [Urine:2825; Other:1000]  PHYSICAL EXAMINATION: General:  Adult male on vent, opens eyes and tracks Neuro:  Alert and interactive, moving all ext to command HEENT:  [place]/AT, PERRL, EOM-I and MMM Cardiovascular:  RRR, Nl S1/S2 and -M/R/G. Lungs:  Coarse BS diffusely Abdomen:  Soft, NT, ND and +BS Musculoskeletal: 1+ edema and  -tenderness Skin:  Warm, dry   LABS:  BMET Recent Labs  Lab 02/18/18 0558 02/18/18 1840 02/19/18 0252  NA 136 139 139  K 3.1* 3.3* 3.4*  CL 102 104 101  CO2 20* 24 24  BUN 96* 102* 71*  CREATININE 5.46* 5.60* 4.34*  GLUCOSE 147* 115* 116*   Electrolytes Recent Labs  Lab 02/18/18 0558 02/18/18 1840 02/19/18 0252  CALCIUM 8.2* 8.5* 8.6*  MG 2.1 2.3 2.2  PHOS 3.8 3.9  3.8 3.7   CBC Recent Labs  Lab 02/18/18 0558 02/18/18 2019 02/19/18 0252  WBC 10.8* 11.6* 10.7*  HGB 10.1* 10.3* 11.1*  HCT 31.0* 32.8* 33.6*  PLT 186 183 189   Coag's Recent Labs  Lab 02/17/18 0300  INR 0.98   Sepsis Markers Recent Labs  Lab 02/17/18 0311 02/17/18 0427 02/17/18 1020 02/18/18 0558 02/19/18 0252  LATICACIDVEN 4.44* 2.2* 1.2  --   --   PROCALCITON  --  0.13  --  18.88 17.98   ABG Recent Labs  Lab 02/17/18 0413 02/19/18 0450  PHART 7.286* 7.531*  PCO2ART 44.9 27.8*  PO2ART 103.0 156*   Liver Enzymes Recent Labs  Lab 02/17/18 0300 02/18/18 1840  AST 27  --   ALT 22  --   ALKPHOS 117  --   BILITOT 0.6  --   ALBUMIN 4.1 3.1*   Cardiac Enzymes Recent Labs  Lab 02/17/18 0455 02/17/18 1020 02/17/18 1633  TROPONINI  0.08* 0.07* 0.07*   Glucose Recent Labs  Lab 02/18/18 1218 02/18/18 1539 02/18/18 2007 02/18/18 2354 02/19/18 0258 02/19/18 0758  GLUCAP 110* 76 107* 113* 111* 127*   Imaging Ir Fluoro Guide Cv Line Right  Result Date: 02/18/2018 CLINICAL DATA:  End-stage renal disease, needs durable venous access for hemodialysis EXAM: TUNNELED HEMODIALYSIS CATHETER PLACEMENT WITH ULTRASOUND AND FLUOROSCOPIC GUIDANCE TECHNIQUE: The procedure, risks, benefits, and alternatives were explained to the spouse. Questions regarding the procedure were encouraged and answered. The spouse understands and consents to the procedure. As antibiotic prophylaxis, cefazolin 2 g was ordered pre-procedure and administered intravenously within one hour of incision.Patency of  the right IJ vein was confirmed with ultrasound with image documentation. An appropriate skin site was determined. Region was prepped using maximum barrier technique including cap and mask, sterile gown, sterile gloves, large sterile sheet, and Chlorhexidine as cutaneous antisepsis. The region was infiltrated locally with 1% lidocaine. Intravenous Fentanyl and Versed were administered as conscious sedation during continuous monitoring of the patient's level of consciousness and physiological / cardiorespiratory status by the radiology RN, with a total moderate sedation time of 10 minutes. Under real-time ultrasound guidance, the right IJ vein was accessed with a 21 gauge micropuncture needle; the needle tip within the vein was confirmed with ultrasound image documentation. Needle exchanged over the 018 guidewire for transitional dilator, which allowed advancement of a Benson wire into the IVC. Over this, an MPA catheter was advanced. A Palindrome 23 hemodialysis catheter was tunneled from the right anterior chest wall approach to the right IJ dermatotomy site. The MPA catheter was exchanged over an Amplatz wire for serial vascular dilators which allow placement of a peel-away sheath, through which the catheter was advanced under intermittent fluoroscopy, positioned with its tips in the proximal and midright atrium. Spot chest radiograph confirms good catheter position. No pneumothorax. Catheter was flushed and primed per protocol. Catheter secured externally with O Prolene sutures. The right IJ dermatotomy site was closed with Dermabond. COMPLICATIONS: COMPLICATIONS None immediate FLUOROSCOPY TIME:  1 minutes; 9 mGy COMPARISON:  None IMPRESSION: 1. Technically successful placement of tunneled right IJ hemodialysis catheter with ultrasound and fluoroscopic guidance. Ready for routine use. ACCESS: Remains approachable for percutaneous intervention as needed. Electronically Signed   By: Lucrezia Europe M.D.   On: 02/18/2018  16:39   Ir US Guide Vasc Access Right  Result Date: 02/18/2018 CLINICAL DATA:  End-stage renal disease, needs durable venous access for hemodialysis EXAM: TUNNELED HEMODIALYSIS CATHETER PLACEMENT WITH ULTRASOUND AND FLUOROSCOPIC GUIDANCE TECHNIQUE: The procedure, risks, benefits, and alternatives were explained to the spouse. Questions regarding the procedure were encouraged and answered. The spouse understands and consents to the procedure. As antibiotic prophylaxis, cefazolin 2 g was ordered pre-procedure and administered intravenously within one hour of incision.Patency of the right IJ vein was confirmed with ultrasound with image documentation. An appropriate skin site was determined. Region was prepped using maximum barrier technique including cap and mask, sterile gown, sterile gloves, large sterile sheet, and Chlorhexidine as cutaneous antisepsis. The region was infiltrated locally with 1% lidocaine. Intravenous Fentanyl and Versed were administered as conscious sedation during continuous monitoring of the patient's level of consciousness and physiological / cardiorespiratory status by the radiology RN, with a total moderate sedation time of 10 minutes. Under real-time ultrasound guidance, the right IJ vein was accessed with a 21 gauge micropuncture needle; the needle tip within the vein was confirmed with ultrasound image documentation. Needle exchanged over the 018 guidewire for transitional dilator, which  allowed advancement of a Benson wire into the IVC. Over this, an MPA catheter was advanced. A Palindrome 23 hemodialysis catheter was tunneled from the right anterior chest wall approach to the right IJ dermatotomy site. The MPA catheter was exchanged over an Amplatz wire for serial vascular dilators which allow placement of a peel-away sheath, through which the catheter was advanced under intermittent fluoroscopy, positioned with its tips in the proximal and midright atrium. Spot chest radiograph  confirms good catheter position. No pneumothorax. Catheter was flushed and primed per protocol. Catheter secured externally with O Prolene sutures. The right IJ dermatotomy site was closed with Dermabond. COMPLICATIONS: COMPLICATIONS None immediate FLUOROSCOPY TIME:  1 minutes; 9 mGy COMPARISON:  None IMPRESSION: 1. Technically successful placement of tunneled right IJ hemodialysis catheter with ultrasound and fluoroscopic guidance. Ready for routine use. ACCESS: Remains approachable for percutaneous intervention as needed. Electronically Signed   By: Lucrezia Europe M.D.   On: 02/18/2018 16:39   Dg Chest Port 1 View  Result Date: 02/19/2018 CLINICAL DATA:  Acute on chronic CHF, COPD, respiratory arrest and cardiac arrest. End-stage renal disease, HIV. EXAM: PORTABLE CHEST 1 VIEW COMPARISON:  Chest x-ray of February 18, 2018 FINDINGS: There has been interval placement of a dual-lumen dialysis catheter whose tip projects at the right cavoatrial junction. The lungs are well-expanded. The interstitial markings are slightly less conspicuous overall today. There is no significant pleural effusion. The cardiac silhouette is enlarged. The pulmonary vascularity is not engorged. The endotracheal tube tip lies 5.9 cm above the carina. The esophagogastric tube tip in proximal port project below the GE junction. IMPRESSION: Interval placement of a dialysis catheter via the right internal jugular approach without postprocedure complication. Stable cardiomegaly with mild improvement in interstitial edema. The support tubes are in reasonable position. Electronically Signed   By: David  [place]ique M.D.   On: 02/19/2018 07:31   STUDIES:  CXR 3/17 > 1. Endotracheal tube 7.4 cm from the carina. Enteric tube in place with tip visualized in the stomach. 2. Cardiomegaly. Bilateral ill-defined perihilar opacities, favoring pulmonary edema. Multifocal pneumonia or aspiration could have a similar appearance. CT Chest 3/17 >> CT Head 3/17  >> ECHO 3/17 >>   CULTURES: Blood 3/17 >>NTD Sputum 3/17 >>NTD U/A 3/17 >>NTD  ANTIBIOTICS: None   SIGNIFICANT EVENTS: 3/17 > Presents to ED   LINES/TUBES: ETT 3/17 >>3/19  DISCUSSION: 77 y.o.malewith severe and sudden shortness of breath presents to ED on 3/17 agonal and pulseless for 5-10 minutes before return of ROSC. Intubated. PCCM asked to admit.   ASSESSMENT / PLAN:  PULMONARY A: Acute Hypoxic Respiratory Failure in setting of suspected decompensated heart failure, +/-viral illness  CXR with cardiomegaly and perihilar opacities concerning for pulmonary edema  P:   Extubate today Pulmonary Hygiene  RVP all negative Titrate O2 for sat of 88-92% SLP Ambulate SLP Flutter valve  CARDIOVASCULAR A:  PEA/Aystole Arrest 5-10 minutes  Decompensated Heart Failure  Nonischemic cardiomyopathy with EF 67-61% and diastolic dysfunction H/O HTN, HLD P:  Cardiac Monitoring  Maintain MAP >65  ECHO per cards ?cath  RENAL A:   Acute on Chronic Kidney Disease (Baseline Crt 4.6-4.9)  Crt 5.32 >  -Followed by Dr. Posey Pronto in office, plans for AVF placement  -Given 100 meq Lasix in ED  Anion Gap Metabolic Acidosis, Lactic Acidosis suspected secondary to hypoperfusion  P:   Trend BMP  Replace electrolytes as indicated  HD per renal Appreciate input form renal  GASTROINTESTINAL A:   SUP  P:   D/C TF SLP PPI   HEMATOLOGIC A:   Microcytic Anemia  HIV/AIDS Prostate Cancer s/p brachytherapy P:  Trend CBC  Maintain Hbg >7   INFECTIOUS A:   Afebrile, WBC 10.9  P:   Trend WBC and Fever Curve PAN Culture  Monitor off antibiotics, no overt evidence of infection Anti-retroviral drugs, will call ID to adjust  ENDOCRINE A:   Hyperglycemia    P:   Trend Glucose  SSI   NEUROLOGIC A:   Sedation needs -Tylenol and Salicylate level negative  H/O CVA P:   RASS goal: 2 D/C fentanyl D/C propofol D/C versed CT Head negative  FAMILY  - Updates: Family  updated at bedside   - Inter-disciplinary family meet or Palliative Care meeting due by:  02/24/2018   The patient is critically ill with multiple organ systems failure and requires high complexity decision making for assessment and support, frequent evaluation and titration of therapies, application of advanced monitoring technologies and extensive interpretation of multiple databases.   Critical Care Time devoted to patient care services described in this note is  35  Minutes. This time reflects time of care of this signee Dr Jennet Maduro. This critical care time does not reflect procedure time, or teaching time or supervisory time of PA/NP/Med student/Med Resident etc but could involve care discussion time.  Rush Farmer, M.D. [facility] Pulmonary/Critical Care Medicine. Pager: [phone]. After hours pager: [phone].

## 2018-02-19 NOTE — Progress Notes (Signed)
Progress Note  Patient Name: David [name] Date of Encounter: 02/19/2018  Primary Cardiologist: Skeet Latch, MD   Subjective   Denies pain. Intubated.   Inpatient Medications    Scheduled Meds: . carvedilol  6.25 mg Oral BID WC  . chlorhexidine gluconate (MEDLINE KIT)  15 mL Mouth Rinse BID  . dolutegravir  50 mg Oral Daily  . emtricitabine-tenofovir AF  1 tablet Oral Daily  . feeding supplement (PRO-STAT SUGAR FREE 64)  30 mL Per Tube BID  . mouth rinse  15 mL Mouth Rinse QID  . mupirocin ointment   Nasal BID  . pantoprazole (PROTONIX) IV  40 mg Intravenous QHS  . scopolamine  1 patch Transdermal Q72H   Continuous Infusions: . sodium chloride 250 mL (02/18/18 2000)  . sodium chloride    . sodium chloride    . feeding supplement (VITAL AF 1.2 CAL) 1,000 mL (02/18/18 2000)  . fentaNYL infusion INTRAVENOUS 75 mcg/hr (02/18/18 2000)  . furosemide Stopped (02/19/18 8099)  . propofol (DIPRIVAN) infusion 20 mcg/kg/min (02/19/18 0622)   PRN Meds: sodium chloride, sodium chloride, sodium chloride, alteplase, fentaNYL, heparin, heparin, lidocaine (PF), lidocaine-prilocaine, midazolam, midazolam, pentafluoroprop-tetrafluoroeth   Vital Signs    Vitals:   02/19/18 0600 02/19/18 0700 02/19/18 0754 02/19/18 0800  BP: 98/70 93/66 93/66 111/75  Pulse: 69 70  77  Resp: _0 Temp:   98.9 F (37.2 C)   TempSrc:   Oral   SpO2: 100% 100%  100%  Weight:      Height:        Intake/Output Summary (Last 24 hours) at 02/19/2018 0811 Last data filed at 02/19/2018 0800 Gross per 24 hour  Intake 2195.3 ml  Output 2875 ml  Net -679.7 ml   Filed Weights   02/18/18 2239 02/19/18 0130 02/19/18 0238  Weight: 205 lb 0.4 oz (93 kg) 202 lb 13.2 oz (92 kg) 198 lb 6.6 oz (90 kg)    Telemetry    Sinus rhythm.  Multiform PVCs - Personally Reviewed  ECG    n/a - Personally Reviewed  Physical Exam   VS:  BP 111/75   Pulse 77   Temp 98.9 F (37.2 C) (Oral)   Resp  20   Ht 6' 2" (1.88 m)   Wt 198 lb 6.6 oz (90 kg)   SpO2 100%   BMI 25.47 kg/m  , BMI Body mass index is 25.47 kg/m. GENERAL:  Intubated and sedated. HEENT: Pupils equal round and reactive, fundi not visualized, oral mucosa unremarkable NECK:  Unable to assess JVD.  carotid upstroke brisk and symmetric, no bruits LUNGS:  Clear to auscultation bilaterally on anterior exam.  Vented breath sounds HEART:  RRR.  PMI not displaced or sustained,S1 and S2 within normal limits, no S3, no S4, no clicks, no rubs, no murmurs ABD:  Flat, positive bowel sounds normal in frequency in pitch, no bruits, no rebound, no guarding, no midline pulsatile mass, no hepatomegaly, no splenomegaly EXT:  2 plus pulses throughout, no edema, no cyanosis no clubbing SKIN:  No rashes no nodules NEURO:  Cranial nerves II through XII grossly intact, motor grossly intact throughout PSYCH:  Cognitively intact, oriented to person place and time   Labs    Chemistry Recent Labs  Lab 02/17/18 0300  02/18/18 0558 02/18/18 1840 02/19/18 0252  NA 139   < > 136 139 139  K 3.8   < > 3.1* 3.3* 3.4*  CL 104   < >  102 104 101  CO2 19*   < > 20* 24 24  GLUCOSE 157*   < > 147* 115* 116*  BUN 81*   < > 96* 102* 71*  CREATININE 5.32*   < > 5.46* 5.60* 4.34*  CALCIUM 8.8*   < > 8.2* 8.5* 8.6*  PROT 7.9  --   --   --   --   ALBUMIN 4.1  --   --  3.1*  --   AST 27  --   --   --   --   ALT 22  --   --   --   --   ALKPHOS 117  --   --   --   --   BILITOT 0.6  --   --   --   --   GFRNONAA 9*   < > 9* 9* 12*  GFRAA 11*   < > 11* 10* 14*  ANIONGAP 16*   < > _0 < > = values in this interval not displayed.     Hematology Recent Labs  Lab 02/18/18 0558 02/18/18 2019 02/19/18 0252  WBC 10.8* 11.6* 10.7*  RBC 3.12* 3.32* 3.34*  HGB 10.1* 10.3* 11.1*  HCT 31.0* 32.8* 33.6*  MCV 99.4 98.8 100.6*  MCH 32.4 31.0 33.2  MCHC 32.6 31.4 33.0  RDW 14.2 14.1 14.4  PLT 186 183 189    Cardiac Enzymes Recent Labs  Lab  02/17/18 0455 02/17/18 1020 02/17/18 1633  TROPONINI 0.08* 0.07* 0.07*    Recent Labs  Lab 02/17/18 0309  TROPIPOC 0.05     BNP Recent Labs  Lab 02/17/18 0300  BNP 800.9*     DDimer No results for input(s): DDIMER in the last 168 hours.   Radiology    Ir Fluoro Guide Cv Line Right  Result Date: 02/18/2018 CLINICAL DATA:  End-stage renal disease, needs durable venous access for hemodialysis EXAM: TUNNELED HEMODIALYSIS CATHETER PLACEMENT WITH ULTRASOUND AND FLUOROSCOPIC GUIDANCE TECHNIQUE: The procedure, risks, benefits, and alternatives were explained to the spouse. Questions regarding the procedure were encouraged and answered. The spouse understands and consents to the procedure. As antibiotic prophylaxis, cefazolin 2 g was ordered pre-procedure and administered intravenously within one hour of incision.Patency of the right IJ vein was confirmed with ultrasound with image documentation. An appropriate skin site was determined. Region was prepped using maximum barrier technique including cap and mask, sterile gown, sterile gloves, large sterile sheet, and Chlorhexidine as cutaneous antisepsis. The region was infiltrated locally with 1% lidocaine. Intravenous Fentanyl and Versed were administered as conscious sedation during continuous monitoring of the patient's level of consciousness and physiological / cardiorespiratory status by the radiology RN, with a total moderate sedation time of 10 minutes. Under real-time ultrasound guidance, the right IJ vein was accessed with a 21 gauge micropuncture needle; the needle tip within the vein was confirmed with ultrasound image documentation. Needle exchanged over the 018 guidewire for transitional dilator, which allowed advancement of a Benson wire into the IVC. Over this, an MPA catheter was advanced. A Palindrome 23 hemodialysis catheter was tunneled from the right anterior chest wall approach to the right IJ dermatotomy site. The MPA catheter was  exchanged over an Amplatz wire for serial vascular dilators which allow placement of a peel-away sheath, through which the catheter was advanced under intermittent fluoroscopy, positioned with its tips in the proximal and midright atrium. Spot chest radiograph confirms good catheter position. No pneumothorax. Catheter was flushed and primed  per protocol. Catheter secured externally with O Prolene sutures. The right IJ dermatotomy site was closed with Dermabond. COMPLICATIONS: COMPLICATIONS None immediate FLUOROSCOPY TIME:  1 minutes; 9 mGy COMPARISON:  None IMPRESSION: 1. Technically successful placement of tunneled right IJ hemodialysis catheter with ultrasound and fluoroscopic guidance. Ready for routine use. ACCESS: Remains approachable for percutaneous intervention as needed. Electronically Signed   By: Lucrezia Europe M.D.   On: 02/18/2018 16:39   Ir US Guide Vasc Access Right  Result Date: 02/18/2018 CLINICAL DATA:  End-stage renal disease, needs durable venous access for hemodialysis EXAM: TUNNELED HEMODIALYSIS CATHETER PLACEMENT WITH ULTRASOUND AND FLUOROSCOPIC GUIDANCE TECHNIQUE: The procedure, risks, benefits, and alternatives were explained to the spouse. Questions regarding the procedure were encouraged and answered. The spouse understands and consents to the procedure. As antibiotic prophylaxis, cefazolin 2 g was ordered pre-procedure and administered intravenously within one hour of incision.Patency of the right IJ vein was confirmed with ultrasound with image documentation. An appropriate skin site was determined. Region was prepped using maximum barrier technique including cap and mask, sterile gown, sterile gloves, large sterile sheet, and Chlorhexidine as cutaneous antisepsis. The region was infiltrated locally with 1% lidocaine. Intravenous Fentanyl and Versed were administered as conscious sedation during continuous monitoring of the patient's level of consciousness and physiological /  cardiorespiratory status by the radiology RN, with a total moderate sedation time of 10 minutes. Under real-time ultrasound guidance, the right IJ vein was accessed with a 21 gauge micropuncture needle; the needle tip within the vein was confirmed with ultrasound image documentation. Needle exchanged over the 018 guidewire for transitional dilator, which allowed advancement of a Benson wire into the IVC. Over this, an MPA catheter was advanced. A Palindrome 23 hemodialysis catheter was tunneled from the right anterior chest wall approach to the right IJ dermatotomy site. The MPA catheter was exchanged over an Amplatz wire for serial vascular dilators which allow placement of a peel-away sheath, through which the catheter was advanced under intermittent fluoroscopy, positioned with its tips in the proximal and midright atrium. Spot chest radiograph confirms good catheter position. No pneumothorax. Catheter was flushed and primed per protocol. Catheter secured externally with O Prolene sutures. The right IJ dermatotomy site was closed with Dermabond. COMPLICATIONS: COMPLICATIONS None immediate FLUOROSCOPY TIME:  1 minutes; 9 mGy COMPARISON:  None IMPRESSION: 1. Technically successful placement of tunneled right IJ hemodialysis catheter with ultrasound and fluoroscopic guidance. Ready for routine use. ACCESS: Remains approachable for percutaneous intervention as needed. Electronically Signed   By: Lucrezia Europe M.D.   On: 02/18/2018 16:39   Dg Chest Port 1 View  Result Date: 02/19/2018 CLINICAL DATA:  Acute on chronic CHF, COPD, respiratory arrest and cardiac arrest. End-stage renal disease, HIV. EXAM: PORTABLE CHEST 1 VIEW COMPARISON:  Chest x-ray of February 18, 2018 FINDINGS: There has been interval placement of a dual-lumen dialysis catheter whose tip projects at the right cavoatrial junction. The lungs are well-expanded. The interstitial markings are slightly less conspicuous overall today. There is no significant  pleural effusion. The cardiac silhouette is enlarged. The pulmonary vascularity is not engorged. The endotracheal tube tip lies 5.9 cm above the carina. The esophagogastric tube tip in proximal port project below the GE junction. IMPRESSION: Interval placement of a dialysis catheter via the right internal jugular approach without postprocedure complication. Stable cardiomegaly with mild improvement in interstitial edema. The support tubes are in reasonable position. Electronically Signed   By: David  Martinique M.D.   On: 02/19/2018 07:31  Dg Chest Port 1 View  Result Date: 02/18/2018 CLINICAL DATA:  Acute respiratory failure, hypoxia, intubated patient. History of CHF, renal insufficiency, HIV, and prostate malignancy. EXAM: PORTABLE CHEST 1 VIEW COMPARISON:  Chest x-ray and chest CT scan of February 17, 2018 FINDINGS: The lungs are adequately inflated. The interstitial markings remain increased and are more conspicuous today on the left. The cardiac silhouette remains enlarged. The pulmonary vascularity is not engorged. The endotracheal tube tip lies 6.4 cm above the carina. The esophagogastric tube tip and proximal port project below the GE junction. IMPRESSION: Persistent bilateral pneumonia. Slight increased opacity noted on the left today. No overt pulmonary edema. The support tubes are in reasonable position where visualized. Electronically Signed   By: David  Martinique M.D.   On: 02/18/2018 08:35    Cardiac Studies   Echo 02/18/18: Study Conclusions  - Left ventricle: The cavity size was moderately dilated. There was   moderate concentric hypertrophy. Systolic function was normal.   The estimated ejection fraction was in the range of 15% to 20%.   Doppler parameters are consistent with abnormal left ventricular   relaxation (grade 1 diastolic dysfunction). - Ventricular septum: Septal motion showed paradox. - Aortic valve: There was mild regurgitation. - Left atrium: The atrium was mildly  dilated.  Impressions:  - No prior study available for comparison. LV is moderately dilated   with severely decreased LVEF estimated at 15-20%. There is   diffuse hypokinesis with akinesis at the inferior and   inferolateral walls. RV is poorly visualized but appears to have   normal size and systolic function.  Patient Profile     Kaimana Lurz is a 77 y.o. 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   Assessment & Plan    # Acute on chronic systolic and diastolic heart failure: # Hypoxic/hypercardic respiratory failure: BNP 800.  Echo this admission with LVEF 15-20%, down slightly from the report of 20-25% prior to admission.  Volume management with HD and lasix per nephrology.  Continue carvedilol.    # ESRD: Started HD 3/18 with 1000 mL removed.  Also had 1800 mL of UOP.  # Elevated troponin: Troponin very mildly elevated to 0.08.  This is likely demand ischemia and due to chest compressions.  EKG reveals left bundle branch block.  Chronicity of this is unclear.  Ischemia evaluation once more stable.       For questions or updates, please contact [place] Please consult [email or web address] for contact info under Cardiology/STEMI.      Signed, Skeet Latch, MD  02/19/2018, 8:11 AM

## 2018-02-19 NOTE — Procedures (Signed)
Extubation Procedure Note  Patient Details:   Name: David [name] DOB: Jul 25, 1941 MRN: [id number]   Airway Documentation:  Airway 7.5 mm (Active)  Secured at (cm) 26 cm 02/19/2018 10:53 AM  Measured From Lips 02/19/2018 10:53 AM  Secured Location Right 02/19/2018 10:53 AM  Secured By [employer] 02/19/2018 10:53 AM  Tube Holder Repositioned Yes 02/19/2018 10:53 AM  Cuff Pressure (cm H2O) 26 cm H2O 02/18/2018  7:51 PM  Site Condition Dry 02/19/2018 10:53 AM    Evaluation  O2 sats: stable throughout Complications: No apparent complications Patient did tolerate procedure well. Bilateral Breath Sounds: Rhonchi   Yes   Patient extubated to [place]. Vitals stable throughout. Rn at bedside. RT will continue to monitor.  Lottie Rater 02/19/2018, 11:26 AM

## 2018-02-19 NOTE — Plan of Care (Signed)
  Progressing Education: Knowledge of General Education information will improve 02/19/2018 2145 - Progressing by Terrill Mohr, RN Health Behavior/Discharge Planning: Ability to manage health-related needs will improve 02/19/2018 2145 - Progressing by Terrill Mohr, RN Clinical Measurements: Ability to maintain clinical measurements within normal limits will improve 02/19/2018 2145 - Progressing by Terrill Mohr, RN Will remain free from infection 02/19/2018 2145 - Progressing by Terrill Mohr, RN Diagnostic test results will improve 02/19/2018 2145 - Progressing by Terrill Mohr, RN Respiratory complications will improve 02/19/2018 2145 - Progressing by Terrill Mohr, RN Note Patient extubated to 4 L [place] today and tolerating well. Cardiovascular complication will be avoided 02/19/2018 2145 - Progressing by Terrill Mohr, RN Activity: Risk for activity intolerance will decrease 02/19/2018 2145 - Progressing by Terrill Mohr, RN Nutrition: Adequate nutrition will be maintained 02/19/2018 2145 - Progressing by Terrill Mohr, RN Coping: Level of anxiety will decrease 02/19/2018 2145 - Progressing by Terrill Mohr, RN Elimination: Will not experience complications related to bowel motility 02/19/2018 2145 - Progressing by Terrill Mohr, RN Will not experience complications related to urinary retention 02/19/2018 2145 - Progressing by Terrill Mohr, RN Safety: Ability to remain free from injury will improve 02/19/2018 2145 - Progressing by Terrill Mohr, RN Skin Integrity: Risk for impaired skin integrity will decrease 02/19/2018 2145 - Progressing by Terrill Mohr, RN Activity: Ability to tolerate increased activity will improve 02/19/2018 2145 - Progressing by Terrill Mohr, RN Education: Knowledge of disease and its progression will improve 02/19/2018 2145 - Progressing by Terrill Mohr, RN Coping: Development of coping mechanisms to deal with changes in  body function or appearance will improve 02/19/2018 2145 - Progressing by Terrill Mohr, RN Fluid Volume: Compliance with measures to maintain balanced fluid volume will improve 02/19/2018 2145 - Progressing by [place], [place] Behavior/Discharge Planning: Ability to manage health-related needs will improve 02/19/2018 2145 - Progressing by Terrill Mohr, RN Nutritional: Ability to make healthy dietary choices will improve 02/19/2018 2145 - Progressing by Terrill Mohr, RN Physical Regulation: Complications related to the disease process, condition or treatment will be avoided or minimized 02/19/2018 2145 - Progressing by [place], Georganna Skeans, RN

## 2018-02-19 NOTE — Progress Notes (Signed)
Dialysis treatment completed.  1500 mL ultrafiltrated.  1000 mL net fluid removal.  Patient status unchanged. Lung sounds diminished and coarse to ausculation in all fields. BLE 1+ pitting edema. Cardiac: NSR, ectopy.  Cleansed RIJ catheter with chlorhexidine.  Disconnected lines and flushed ports with saline per protocol.  Ports locked with heparin and capped per protocol.    Report given to bedside, RN Marcene Brawn.

## 2018-02-19 NOTE — Progress Notes (Signed)
Potassium result 3.3  Acid bath switched to 4K.  Will continue to monitor.

## 2018-02-19 NOTE — Progress Notes (Signed)
Assessment/Plan:77 year old BM with controlled HIV, EF 20-25 % as well as stage 5 CKD. This is third hosp in 3 mos for pulmonary edema/pulmonary issues   1. New ESRD, s/p [facility] 3/18 Will need eval for AVF.  Do Vein mapping and ask VVS to see.  HD in AM. 2. Hypertension/volume- BPs soft, add midodrine 3  VDRF s/p extubation today 4. Bones- PTH 133 5. Hypokalemia  Subjective: Interval History: Extubated today.  -1000cc off with HD this AM.  Objective: Vital signs in last 24 hours: Temp:  [98.9 F (37.2 C)-99.5 F (37.5 C)] 99.5 F (37.5 C) (03/19 1146) Pulse Rate:  [50-99] 93 (03/19 1300) Resp:  [12-22] 20 (03/19 1300) BP: (87-150)/(61-127) 150/127 (03/19 1300) SpO2:  [62 %-100 %] 100 % (03/19 1300) FiO2 (%):  [40 %] 40 % (03/19 1053) Weight:  [90 kg (198 lb 6.6 oz)-93 kg (205 lb 0.4 oz)] 90 kg (198 lb 6.6 oz) (03/19 0238) Weight change: 0.4 kg (14.1 oz)  Intake/Output from previous day: 03/18 0701 - 03/19 0700 In: 2202.8 [I.V.:698.8; NG/GT:1140; IV Piggyback:364] Out: 2875 [Urine:1875] Intake/Output this shift: Total I/O In: 266 [I.V.:50; NG/GT:150; IV Piggyback:66] Out: 325 [Urine:325]  General appearance: alert and cooperative Head: Normocephalic, without obvious abnormality, atraumatic GI: soft, non-tender; bowel sounds normal; no masses,  no organomegaly and protuberant Extremities: edema tr  Lab Results: Recent Labs    02/18/18 2019 02/19/18 0252  WBC 11.6* 10.7*  HGB 10.3* 11.1*  HCT 32.8* 33.6*  PLT 183 189   BMET:  Recent Labs    02/18/18 1840 02/19/18 0252  NA 139 139  K 3.3* 3.4*  CL 104 101  CO2 24 24  GLUCOSE 115* 116*  BUN 102* 71*  CREATININE 5.60* 4.34*  CALCIUM 8.5* 8.6*   Recent Labs    02/18/18 0558  PTH 133*   Iron Studies: No results for input(s): IRON, TIBC, TRANSFERRIN, FERRITIN in the last 72 hours. Studies/Results: Ir Fluoro Guide Cv Line Right  Result Date: 02/18/2018 CLINICAL DATA:  End-stage renal disease, needs  durable venous access for hemodialysis EXAM: TUNNELED HEMODIALYSIS CATHETER PLACEMENT WITH ULTRASOUND AND FLUOROSCOPIC GUIDANCE TECHNIQUE: The procedure, risks, benefits, and alternatives were explained to the spouse. Questions regarding the procedure were encouraged and answered. The spouse understands and consents to the procedure. As antibiotic prophylaxis, cefazolin 2 g was ordered pre-procedure and administered intravenously within one hour of incision.Patency of the right IJ vein was confirmed with ultrasound with image documentation. An appropriate skin site was determined. Region was prepped using maximum barrier technique including cap and mask, sterile gown, sterile gloves, large sterile sheet, and Chlorhexidine as cutaneous antisepsis. The region was infiltrated locally with 1% lidocaine. Intravenous Fentanyl and Versed were administered as conscious sedation during continuous monitoring of the patient's level of consciousness and physiological / cardiorespiratory status by the radiology RN, with a total moderate sedation time of 10 minutes. Under real-time ultrasound guidance, the right IJ vein was accessed with a 21 gauge micropuncture needle; the needle tip within the vein was confirmed with ultrasound image documentation. Needle exchanged over the 018 guidewire for transitional dilator, which allowed advancement of a Benson wire into the IVC. Over this, an MPA catheter was advanced. A Palindrome 23 hemodialysis catheter was tunneled from the right anterior chest wall approach to the right IJ dermatotomy site. The MPA catheter was exchanged over an Amplatz wire for serial vascular dilators which allow placement of a peel-away sheath, through which the catheter was advanced under intermittent fluoroscopy,  positioned with its tips in the proximal and midright atrium. Spot chest radiograph confirms good catheter position. No pneumothorax. Catheter was flushed and primed per protocol. Catheter secured  externally with O Prolene sutures. The right IJ dermatotomy site was closed with Dermabond. COMPLICATIONS: COMPLICATIONS None immediate FLUOROSCOPY TIME:  1 minutes; 9 mGy COMPARISON:  None IMPRESSION: 1. Technically successful placement of tunneled right IJ hemodialysis catheter with ultrasound and fluoroscopic guidance. Ready for routine use. ACCESS: Remains approachable for percutaneous intervention as needed. Electronically Signed   By: Lucrezia Europe M.D.   On: 02/18/2018 16:39   Ir US Guide Vasc Access Right  Result Date: 02/18/2018 CLINICAL DATA:  End-stage renal disease, needs durable venous access for hemodialysis EXAM: TUNNELED HEMODIALYSIS CATHETER PLACEMENT WITH ULTRASOUND AND FLUOROSCOPIC GUIDANCE TECHNIQUE: The procedure, risks, benefits, and alternatives were explained to the spouse. Questions regarding the procedure were encouraged and answered. The spouse understands and consents to the procedure. As antibiotic prophylaxis, cefazolin 2 g was ordered pre-procedure and administered intravenously within one hour of incision.Patency of the right IJ vein was confirmed with ultrasound with image documentation. An appropriate skin site was determined. Region was prepped using maximum barrier technique including cap and mask, sterile gown, sterile gloves, large sterile sheet, and Chlorhexidine as cutaneous antisepsis. The region was infiltrated locally with 1% lidocaine. Intravenous Fentanyl and Versed were administered as conscious sedation during continuous monitoring of the patient's level of consciousness and physiological / cardiorespiratory status by the radiology RN, with a total moderate sedation time of 10 minutes. Under real-time ultrasound guidance, the right IJ vein was accessed with a 21 gauge micropuncture needle; the needle tip within the vein was confirmed with ultrasound image documentation. Needle exchanged over the 018 guidewire for transitional dilator, which allowed advancement of a  Benson wire into the IVC. Over this, an MPA catheter was advanced. A Palindrome 23 hemodialysis catheter was tunneled from the right anterior chest wall approach to the right IJ dermatotomy site. The MPA catheter was exchanged over an Amplatz wire for serial vascular dilators which allow placement of a peel-away sheath, through which the catheter was advanced under intermittent fluoroscopy, positioned with its tips in the proximal and midright atrium. Spot chest radiograph confirms good catheter position. No pneumothorax. Catheter was flushed and primed per protocol. Catheter secured externally with O Prolene sutures. The right IJ dermatotomy site was closed with Dermabond. COMPLICATIONS: COMPLICATIONS None immediate FLUOROSCOPY TIME:  1 minutes; 9 mGy COMPARISON:  None IMPRESSION: 1. Technically successful placement of tunneled right IJ hemodialysis catheter with ultrasound and fluoroscopic guidance. Ready for routine use. ACCESS: Remains approachable for percutaneous intervention as needed. Electronically Signed   By: Lucrezia Europe M.D.   On: 02/18/2018 16:39   Dg Chest Port 1 View  Result Date: 02/19/2018 CLINICAL DATA:  Acute on chronic CHF, COPD, respiratory arrest and cardiac arrest. End-stage renal disease, HIV. EXAM: PORTABLE CHEST 1 VIEW COMPARISON:  Chest x-ray of February 18, 2018 FINDINGS: There has been interval placement of a dual-lumen dialysis catheter whose tip projects at the right cavoatrial junction. The lungs are well-expanded. The interstitial markings are slightly less conspicuous overall today. There is no significant pleural effusion. The cardiac silhouette is enlarged. The pulmonary vascularity is not engorged. The endotracheal tube tip lies 5.9 cm above the carina. The esophagogastric tube tip in proximal port project below the GE junction. IMPRESSION: Interval placement of a dialysis catheter via the right internal jugular approach without postprocedure complication. Stable cardiomegaly  with mild improvement in  interstitial edema. The support tubes are in reasonable position. Electronically Signed   By: David  Martinique M.D.   On: 02/19/2018 07:31   Dg Chest Port 1 View  Result Date: 02/18/2018 CLINICAL DATA:  Acute respiratory failure, hypoxia, intubated patient. History of CHF, renal insufficiency, HIV, and prostate malignancy. EXAM: PORTABLE CHEST 1 VIEW COMPARISON:  Chest x-ray and chest CT scan of February 17, 2018 FINDINGS: The lungs are adequately inflated. The interstitial markings remain increased and are more conspicuous today on the left. The cardiac silhouette remains enlarged. The pulmonary vascularity is not engorged. The endotracheal tube tip lies 6.4 cm above the carina. The esophagogastric tube tip and proximal port project below the GE junction. IMPRESSION: Persistent bilateral pneumonia. Slight increased opacity noted on the left today. No overt pulmonary edema. The support tubes are in reasonable position where visualized. Electronically Signed   By: David  Martinique M.D.   On: 02/18/2018 08:35    Scheduled: . carvedilol  6.25 mg Oral BID  . [START ON 02/20/2018] carvedilol  6.25 mg Oral BID WC  . chlorhexidine gluconate (MEDLINE KIT)  15 mL Mouth Rinse BID  . dolutegravir  50 mg Oral Daily  . emtricitabine-tenofovir AF  1 tablet Oral Daily  . heparin injection (subcutaneous)  5,000 Units Subcutaneous Q8H  . mouth rinse  15 mL Mouth Rinse QID  . mupirocin ointment   Nasal BID  . pantoprazole (PROTONIX) IV  40 mg Intravenous QHS  . scopolamine  1 patch Transdermal Q72H    LOS: 2 days   Estanislado Emms 02/19/2018,2:01 PM

## 2018-02-19 NOTE — Plan of Care (Signed)
Patient was successfully extubated today. He has done well on nasal cannula and his vitals have improved. He is able to communicate well but his voice remains very soft at this time. He is not showing any signs of anxiety and his wife and family have been to visit and are very pleased with his progress. Family is very supportive and open to any suggestions to help him improve.

## 2018-02-20 ENCOUNTER — Inpatient Hospital Stay (HOSPITAL_COMMUNITY)
Admission: EM | Disposition: A | Discharge status: Home / Self Care | Payer: Self-pay | Source: Home / Self Care | Attending: Internal Medicine

## 2018-02-20 ENCOUNTER — Encounter (HOSPITAL_COMMUNITY): Payer: Medicare HMO

## 2018-02-20 DIAGNOSIS — J449 Chronic obstructive pulmonary disease, unspecified: Secondary | ICD-10-CM

## 2018-02-20 DIAGNOSIS — B20 Human immunodeficiency virus [HIV] disease: Secondary | ICD-10-CM

## 2018-02-20 DIAGNOSIS — I5043 Acute on chronic combined systolic (congestive) and diastolic (congestive) heart failure: Secondary | ICD-10-CM

## 2018-02-20 HISTORY — PX: ULTRASOUND GUIDANCE FOR VASCULAR ACCESS: SHX6516

## 2018-02-20 HISTORY — PX: LEFT HEART CATH AND CORONARY ANGIOGRAPHY: CATH118249

## 2018-02-20 LAB — BASIC METABOLIC PANEL
Anion gap: 14 (ref 5–15)
BUN: 78 mg/dL — AB (ref 6–20)
CALCIUM: 8.9 mg/dL (ref 8.9–10.3)
CO2: 24 mmol/L (ref 22–32)
CREATININE: 5.06 mg/dL — AB (ref 0.61–1.24)
Chloride: 101 mmol/L (ref 101–111)
GFR calc non Af Amer: 10 mL/min — ABNORMAL LOW (ref >60)
GFR, EST AFRICAN AMERICAN: 12 mL/min — AB (ref >60)
Glucose, Bld: 116 mg/dL — ABNORMAL HIGH (ref 65–99)
Potassium: 3.4 mmol/L — ABNORMAL LOW (ref 3.5–5.1)
SODIUM: 139 mmol/L (ref 135–145)

## 2018-02-20 LAB — CBC
HCT: 33.1 % — ABNORMAL LOW (ref 39.0–52.0)
Hemoglobin: 10.7 g/dL — ABNORMAL LOW (ref 13.0–17.0)
MCH: 32.4 pg (ref 26.0–34.0)
MCHC: 32.3 g/dL (ref 30.0–36.0)
MCV: 100.3 fL — ABNORMAL HIGH (ref 78.0–100.0)
PLATELETS: 186 10*3/uL (ref 150–400)
RBC: 3.3 MIL/uL — ABNORMAL LOW (ref 4.22–5.81)
RDW: 14.1 % (ref 11.5–15.5)
WBC: 10.8 10*3/uL — ABNORMAL HIGH (ref 4.0–10.5)

## 2018-02-20 LAB — GLUCOSE, CAPILLARY
GLUCOSE-CAPILLARY: 116 mg/dL — AB (ref 65–99)
GLUCOSE-CAPILLARY: 207 mg/dL — AB (ref 65–99)
GLUCOSE-CAPILLARY: 230 mg/dL — AB (ref 65–99)
Glucose-Capillary: 114 mg/dL — ABNORMAL HIGH (ref 65–99)
Glucose-Capillary: 115 mg/dL — ABNORMAL HIGH (ref 65–99)
Glucose-Capillary: 118 mg/dL — ABNORMAL HIGH (ref 65–99)
Glucose-Capillary: 125 mg/dL — ABNORMAL HIGH (ref 65–99)

## 2018-02-20 LAB — HEPATITIS B SURFACE ANTIBODY,QUALITATIVE: HEP B S AB: NONREACTIVE

## 2018-02-20 LAB — TRIGLYCERIDES: Triglycerides: 112 mg/dL (ref <150)

## 2018-02-20 LAB — HEPATITIS B CORE ANTIBODY, TOTAL: Hep B Core Total Ab: NEGATIVE

## 2018-02-20 LAB — MAGNESIUM: MAGNESIUM: 2.2 mg/dL (ref 1.7–2.4)

## 2018-02-20 LAB — HEPATITIS B SURFACE ANTIGEN: HEP B S AG: NEGATIVE

## 2018-02-20 LAB — PHOSPHORUS: PHOSPHORUS: 6 mg/dL — AB (ref 2.5–4.6)

## 2018-02-20 SURGERY — LEFT HEART CATH AND CORONARY ANGIOGRAPHY
Anesthesia: LOCAL

## 2018-02-20 MED ORDER — FENTANYL CITRATE (PF) 100 MCG/2ML IJ SOLN
INTRAMUSCULAR | Status: AC
  Filled 2018-02-20: qty 2

## 2018-02-20 MED ORDER — MIDAZOLAM HCL 2 MG/2ML IJ SOLN
INTRAMUSCULAR | Status: DC | PRN
  Administered 2018-02-20: 2 mg via INTRAVENOUS

## 2018-02-20 MED ORDER — SODIUM CHLORIDE 0.9 % IV SOLN
INTRAVENOUS | Status: AC | PRN
  Administered 2018-02-20: 10 mL/h via INTRAVENOUS

## 2018-02-20 MED ORDER — CALCIUM ACETATE (PHOS BINDER) 667 MG PO CAPS
667.0000 mg | ORAL_CAPSULE | Freq: Three times a day (TID) | ORAL | Status: DC
  Administered 2018-02-20 – 2018-02-21 (×3): 667 mg via ORAL
  Filled 2018-02-20 (×5): qty 1

## 2018-02-20 MED ORDER — IOPAMIDOL (ISOVUE-370) INJECTION 76%
INTRAVENOUS | Status: AC
  Filled 2018-02-20: qty 100

## 2018-02-20 MED ORDER — LIDOCAINE HCL (PF) 1 % IJ SOLN
INTRAMUSCULAR | Status: DC | PRN
  Administered 2018-02-20: 17 mL

## 2018-02-20 MED ORDER — FENTANYL CITRATE (PF) 100 MCG/2ML IJ SOLN
INTRAMUSCULAR | Status: DC | PRN
  Administered 2018-02-20: 25 ug via INTRAVENOUS

## 2018-02-20 MED ORDER — HEPARIN (PORCINE) IN NACL 2-0.9 UNIT/ML-% IJ SOLN
INTRAMUSCULAR | Status: AC
  Filled 2018-02-20: qty 1000

## 2018-02-20 MED ORDER — MIDAZOLAM HCL 2 MG/2ML IJ SOLN
INTRAMUSCULAR | Status: AC
  Filled 2018-02-20: qty 2

## 2018-02-20 MED ORDER — IOPAMIDOL (ISOVUE-370) INJECTION 76%
INTRAVENOUS | Status: DC | PRN
  Administered 2018-02-20: 35 mL via INTRA_ARTERIAL

## 2018-02-20 MED ORDER — ONDANSETRON HCL 4 MG/2ML IJ SOLN
4.0000 mg | Freq: Four times a day (QID) | INTRAMUSCULAR | Status: DC | PRN
  Administered 2018-02-20: 4 mg via INTRAVENOUS
  Filled 2018-02-20: qty 2

## 2018-02-20 MED ORDER — HEPARIN (PORCINE) IN NACL 2-0.9 UNIT/ML-% IJ SOLN
INTRAMUSCULAR | Status: AC | PRN
  Administered 2018-02-20 (×2): 500 mL

## 2018-02-20 MED ORDER — LIDOCAINE HCL (PF) 1 % IJ SOLN
INTRAMUSCULAR | Status: AC
  Filled 2018-02-20: qty 30

## 2018-02-20 SURGICAL SUPPLY — 11 items
CATH INFINITI 5FR JL4 (CATHETERS) ×1 IMPLANT
CATH INFINITI 5FR JL5 (CATHETERS) ×1 IMPLANT
CATH INFINITI JR4 5F (CATHETERS) ×1 IMPLANT
COVER PRB 48X5XTLSCP FOLD TPE (BAG) IMPLANT
COVER PROBE 5X48 (BAG) ×3
KIT HEART LEFT (KITS) ×3 IMPLANT
PACK CARDIAC CATHETERIZATION (CUSTOM PROCEDURE TRAY) ×3 IMPLANT
SHEATH AVANTI 11CM 5FR (SHEATH) ×1 IMPLANT
TRANSDUCER W/STOPCOCK (MISCELLANEOUS) ×3 IMPLANT
TUBING CIL FLEX 10 FLL-RA (TUBING) ×3 IMPLANT
WIRE EMERALD 3MM-J .035X150CM (WIRE) ×1 IMPLANT

## 2018-02-20 NOTE — Progress Notes (Signed)
Notified CCM about patients potassium of 3.4 & Mag 2.2 this AM. Patient will have morning labs. No new orders at this time.

## 2018-02-20 NOTE — H&P (View-Only) (Signed)
[facility] NOTE   MRN : [id number]  Reason for Consult: ESRD has [facility] needs access Referring Physician: Dr. Florene Glen  History of Present Illness: 77 y/o male with CKD stage V.  He has had 2 previous access attempts that failed UE right and left.  We have been asked to provide him with future access.    He was brought to the hospital with symptoms of SOB.  He arrived and was pulseless.  He was coded for 10 min.  CXR revealed pulmonary edema and IR placed tunnel catheter to initiate HD.   Planned heart caht today.     Past medical history includes: HIV, prostate CA, heart failure, HTN.  CKD stage V He denise DM.   Current Facility-Administered Medications  Medication Dose Route Frequency Provider Last Rate Last Dose  . 0.9 %  sodium chloride infusion  250 mL Intravenous PRN Omar Person, NP   Stopped at 02/19/18 1100  . 0.9 %  sodium chloride infusion  100 mL Intravenous PRN Estanislado Emms, MD      . 0.9 %  sodium chloride infusion  100 mL Intravenous PRN Estanislado Emms, MD      . alteplase (CATHFLO ACTIVASE) injection 2 mg  2 mg Intracatheter Once PRN Estanislado Emms, MD      . calcium acetate (PHOSLO) capsule 667 mg  667 mg Oral TID WC Estanislado Emms, MD      . carvedilol (COREG) tablet 6.25 mg  6.25 mg Oral BID WC Simonne Maffucci B, MD   6.25 mg at 02/20/18 0840  . Chlorhexidine Gluconate Cloth 2 % PADS 6 each  6 each Topical Daily Simonne Maffucci B, MD      . dolutegravir (TIVICAY) tablet 50 mg  50 mg Oral Daily Simonne Maffucci B, MD   50 mg at 02/20/18 0840  . emtricitabine-tenofovir AF (DESCOVY) 200-25 MG per tablet 1 tablet  1 tablet Oral Daily Juanito Doom, MD   1 tablet at 02/20/18 0841  . heparin injection 1,000 Units  1,000 Units Dialysis PRN Estanislado Emms, MD      . heparin injection 1,900 Units  20 Units/kg Dialysis PRN Estanislado Emms, MD      . heparin injection 5,000 Units  5,000 Units Subcutaneous Q8H Carnella [place], [place]   5,000 Units at 02/20/18 8841  . lidocaine (PF) (XYLOCAINE) 1 % injection 5 mL  5 mL Intradermal PRN Estanislado Emms, MD      . lidocaine-prilocaine (EMLA) cream 1 application  1 application Topical PRN Estanislado Emms, MD      . MEDLINE mouth rinse  15 mL Mouth Rinse BID Simonne Maffucci B, MD   15 mL at 02/20/18 0849  . midazolam (VERSED) injection 1 mg  1 mg Intravenous Q15 min PRN Omar Person, NP      . midazolam (VERSED) injection 1 mg  1 mg Intravenous Q2H PRN Omar Person, NP      . mupirocin ointment (BACTROBAN) 2 %   Nasal BID Simonne Maffucci B, MD      . pantoprazole (PROTONIX) injection 40 mg  40 mg Intravenous QHS Omar Person, NP   40 mg at 02/19/18 2128  . pentafluoroprop-tetrafluoroeth (GEBAUERS) aerosol 1 application  1 application Topical PRN Estanislado Emms, MD      . scopolamine (TRANSDERM-SCOP) 1 MG/3DAYS 1.5 mg  1 patch Transdermal Q72H Juanito Doom, MD  1.5 mg at 02/17/18 1737  . sodium chloride flush (NS) 0.9 % injection 10-40 mL  10-40 mL Intracatheter Q12H Simonne Maffucci B, MD   10 mL at 02/20/18 1012  . sodium chloride flush (NS) 0.9 % injection 10-40 mL  10-40 mL Intracatheter PRN Juanito Doom, MD        Pt meds include: Statin :No Betablocker: Yes ASA: Yes Other anticoagulants/antiplatelets: none  Past Medical History:  Diagnosis Date  . Acute on chronic systolic and diastolic heart failure, NYHA class 4 ([place])   . CKD (chronic kidney disease) stage V requiring chronic dialysis ([place])   . HIV (human immunodeficiency virus infection) ([place])   . Prostate cancer [facility])     Past Surgical History:  Procedure Laterality Date  . IR FLUORO GUIDE CV LINE RIGHT  02/18/2018  . IR US GUIDE VASC ACCESS RIGHT  02/18/2018    Social History Social History   Tobacco Use  . Smoking status: Not on file  Substance Use Topics  . Alcohol use: Not on file  . Drug use: Not on file    Family History History reviewed. No pertinent  family history.  Unknown  No Known Allergies   REVIEW OF SYSTEMS  General: [ ]  Weight loss, [ ]  Fever, [ ]  chills Neurologic: [ ]  Dizziness, [ ]  Blackouts, [ ]  Seizure [ ]  Stroke, [ ]  "Mini stroke", [ ]  Slurred speech, [ ]  Temporary blindness; [ ]  weakness in arms or legs, [ ]  Hoarseness [ ]  Dysphagia Cardiac: [ ]  Chest pain/pressure, [x ] Shortness of breath at rest [ x] Shortness of breath with exertion, [ ]  Atrial fibrillation or irregular heartbeat  Vascular: [ ]  Pain in legs with walking, [ ]  Pain in legs at rest, [ ]  Pain in legs at night,  [ ]  Non-healing ulcer, [ ]  Blood clot in vein/DVT,   Pulmonary: [ ]  Home oxygen, [ ]  Productive cough, [ ]  Coughing up blood, [ ]  Asthma,  [ ]  Wheezing [ ]  COPD Musculoskeletal:  [ ]  Arthritis, [ ]  Low back pain, [ ]  Joint pain Hematologic: [ ]  Easy Bruising, [ ]  Anemia; [ ]  Hepatitis Gastrointestinal: [ ]  Blood in stool, [ ]  Gastroesophageal Reflux/heartburn, Urinary: [x ] chronic Kidney disease, [ ]  on HD - [ ]  MWF or [ ]  TTHS, [ ]  Burning with urination, [ ]  Difficulty urinating Skin: [ ]  Rashes, [ ]  Wounds Psychological: [ ]  Anxiety, [ ]  Depression  Physical Examination Vitals:   02/20/18 0417 02/20/18 0500 02/20/18 0600 02/20/18 0746  BP:  (!) 121/93 136/80 128/74  Pulse:  82 87   Resp:  18 (!) 21   Temp: 99.4 F (37.4 C)   98.6 F (37 C)  TempSrc: Oral   Oral  SpO2:  100% 100%   Weight:  195 lb 8.8 oz (88.7 kg)    Height:       Body mass index is 25.11 kg/m.  General:  WDWN in NAD HENT: WNL Eyes: Pupils equal Pulmonary: normal non-labored breathing , without Rales, rhonchi,  wheezing Cardiac: RRR, without  Murmurs, rubs or gallops; No carotid bruits Abdomen: soft, NT, no masses Skin: no rashes, ulcers noted;  no Gangrene , no cellulitis; no open wounds;   Vascular Exam/Pulses:Palpable radial pulses B   Musculoskeletal: no muscle wasting or atrophy; Neurologic: A&O X 3; Appropriate Affect ;  SENSATION:  normal; MOTOR FUNCTION: grossly 5/5 Symmetric Speech is fluent/normal   Significant Diagnostic Studies: CBC Lab Results  Component  Value Date   WBC 10.8 (H) 02/20/2018   HGB 10.7 (L) 02/20/2018   HCT 33.1 (L) 02/20/2018   MCV 100.3 (H) 02/20/2018   PLT 186 02/20/2018    BMET    Component Value Date/Time   NA 139 02/20/2018 0305   K 3.4 (L) 02/20/2018 0305   CL 101 02/20/2018 0305   CO2 24 02/20/2018 0305   GLUCOSE 116 (H) 02/20/2018 0305   BUN 78 (H) 02/20/2018 0305   CREATININE 5.06 (H) 02/20/2018 0305   CALCIUM 8.9 02/20/2018 0305   GFRNONAA 10 (L) 02/20/2018 0305   GFRAA 12 (L) 02/20/2018 0305   Estimated Creatinine Clearance: 14.7 mL/min (A) (by C-G formula based on SCr of 5.06 mg/dL (H)).  COAG Lab Results  Component Value Date   INR 0.98 02/17/2018     Non-Invasive Vascular Imaging: pending vein mapping  ASSESSMENT/PLAN:  He has Bilateral UE access that has failed in the past.  I'm not sure if they were cephalic verses basilic.  I have ordered vein mapping.  He currently has a right TDC and has HD time 1 since his admission.  Plan for UE access will be discussed after vein mapping is complete.     Roxy Horseman 02/20/2018 12:05 PM   History and exam details as above.  Pt has 2+ brachial and radial pulse bilaterally.  He is right handed.  He has scars consistent with prior left basilic vein transposition and right brachial cephalic.  Will give him a few days to recover and get vein map but most likely will need left arm AV graft early next week  Ruta Hinds, MD [facility] [place] Office: [phone] Pager: [phone]

## 2018-02-20 NOTE — Progress Notes (Signed)
DAILY PROGRESS NOTE   Patient Name: David [name] Date of Encounter: 02/20/2018  Chief Complaint   Extubated, sitting up in a chair  Patient Profile   [name] is a 77 y.o. 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 Lake Bells.  Subjective   Extubated - looks better. New start for dialysis. LVEF on echo is lower at 15-20%. Volume management per nephrology. Will need ischemia evaluation.  Objective   Vitals:   02/20/18 0417 02/20/18 0500 02/20/18 0600 02/20/18 0746  BP:  (!) 121/93 136/80 128/74  Pulse:  82 87   Resp:  18 (!) 21   Temp: 99.4 F (37.4 C)   98.6 F (37 C)  TempSrc: Oral   Oral  SpO2:  100% 100%   Weight:  195 lb 8.8 oz (88.7 kg)    Height:        Intake/Output Summary (Last 24 hours) at 02/20/2018 4174 Last data filed at 02/20/2018 0600 Gross per 24 hour  Intake 206 ml  Output 1780 ml  Net -1574 ml   Filed Weights   02/19/18 0130 02/19/18 0238 02/20/18 0500  Weight: 202 lb 13.2 oz (92 kg) 198 lb 6.6 oz (90 kg) 195 lb 8.8 oz (88.7 kg)    Physical Exam   General appearance: alert, no distress and sitting up in a chair Neck: no carotid bruit, no JVD and thyroid not enlarged, symmetric, no tenderness/mass/nodules Lungs: clear to auscultation bilaterally Heart: regular rate and rhythm and no murmur Abdomen: soft, non-tender; bowel sounds normal; no masses,  no organomegaly Extremities: extremities normal, atraumatic, no cyanosis or edema Pulses: 2+ and symmetric Skin: Skin color, texture, turgor normal. No rashes or lesions Neurologic: Mental status: Alert, oriented, thought content appropriate Psych: flat affect  Inpatient Medications    Scheduled Meds: . carvedilol  6.25 mg Oral BID WC  . Chlorhexidine Gluconate Cloth  6 each Topical Daily  .  dolutegravir  50 mg Oral Daily  . emtricitabine-tenofovir AF  1 tablet Oral Daily  . heparin injection (subcutaneous)  5,000 Units Subcutaneous Q8H  . mouth rinse  15 mL Mouth Rinse BID  . midodrine  5 mg Oral TID WC  . mupirocin ointment   Nasal BID  . pantoprazole (PROTONIX) IV  40 mg Intravenous QHS  . scopolamine  1 patch Transdermal Q72H  . sodium chloride flush  10-40 mL Intracatheter Q12H    Continuous Infusions: . sodium chloride Stopped (02/19/18 1100)  . sodium chloride    . sodium chloride      PRN Meds: sodium chloride, sodium chloride, sodium chloride, alteplase, heparin, heparin, lidocaine (PF), lidocaine-prilocaine, midazolam, midazolam, pentafluoroprop-tetrafluoroeth, sodium chloride flush   Labs   Results for orders placed or performed during the hospital encounter of 02/17/18 (from the past 48 hour(s))  Glucose, capillary     Status: Abnormal   Collection Time: 02/18/18 12:18 PM  Result Value Ref Range   Glucose-Capillary 110 (H) 65 - 99 mg/dL   Comment 1 Capillary Specimen   Glucose, capillary     Status: None   Collection Time: 02/18/18  3:39 PM  Result Value Ref Range   Glucose-Capillary 76 65 - 99 mg/dL   Comment 1 Capillary Specimen   Magnesium     Status: None   Collection Time: 02/18/18  6:40 PM  Result Value Ref Range   Magnesium 2.3 1.7 - 2.4 mg/dL    Comment: Performed at [facility], [place] [address], [place], [place] [address]  Phosphorus     Status: None   Collection Time: 02/18/18  6:40 PM  Result Value Ref Range   Phosphorus 3.8 2.5 - 4.6 mg/dL    Comment: Performed at [facility] [address] [place], [place] [address]  Renal function panel     Status: Abnormal   Collection Time: 02/18/18  6:40 PM  Result Value Ref Range   Sodium 139 135 - 145 mmol/L   Potassium 3.3 (L) 3.5 - 5.1 mmol/L   Chloride 104 101 - 111 mmol/L   CO2 24 22 - 32 mmol/L   Glucose, Bld 115 (H) 65 - 99 mg/dL   BUN 102 (H) 6 - 20 mg/dL    Creatinine, Ser 5.60 (H) 0.61 - 1.24 mg/dL   Calcium 8.5 (L) 8.9 - 10.3 mg/dL   Phosphorus 3.9 2.5 - 4.6 mg/dL   Albumin 3.1 (L) 3.5 - 5.0 g/dL   GFR calc non Af Amer 9 (L) >60 mL/min   GFR calc Af Amer 10 (L) >60 mL/min    Comment: (NOTE) The eGFR has been calculated using the CKD EPI equation. This calculation has not been validated in all clinical situations. eGFR's persistently <60 mL/min signify possible Chronic Kidney Disease.    Anion gap 11 5 - 15    Comment: Performed at [facility] [address] [place], [place] [address]  Glucose, capillary     Status: Abnormal   Collection Time: 02/18/18  8:07 PM  Result Value Ref Range   Glucose-Capillary 107 (H) 65 - 99 mg/dL   Comment 1 Capillary Specimen   CBC     Status: Abnormal   Collection Time: 02/18/18  8:19 PM  Result Value Ref Range   WBC 11.6 (H) 4.0 - 10.5 K/uL   RBC 3.32 (L) 4.22 - 5.81 MIL/uL   Hemoglobin 10.3 (L) 13.0 - 17.0 g/dL   HCT 32.8 (L) 39.0 - 52.0 %   MCV 98.8 78.0 - 100.0 fL   MCH 31.0 26.0 - 34.0 pg   MCHC 31.4 30.0 - 36.0 g/dL   RDW 14.1 11.5 - 15.5 %   Platelets 183 150 - 400 K/uL    Comment: Performed at [facility], [place] [address], [place], [place] [address]  Hepatitis B surface antigen     Status: None   Collection Time: 02/18/18  8:19 PM  Result Value Ref Range   Hepatitis B Surface Ag Negative Negative    Comment: (NOTE) Performed At: [facility] [address] [place], [place] [id number] Rush Farmer MD [id number] Performed at [facility], [place] [address], [place], [place] [address]   Hepatitis B surface antibody     Status: None   Collection Time: 02/18/18  8:19 PM  Result Value Ref Range   Hep B S Ab Non Reactive     Comment: (NOTE)              Non Reactive: Inconsistent with immunity,                            less than 10 mIU/mL              Reactive:     Consistent with immunity,  greater than 9.9 mIU/mL Performed  At: [facility] [place], [place] [id number] Rush Farmer MD [id number] Performed at [facility], [place] [address], [place], [place] [address]   Hepatitis B core antibody, total     Status: None   Collection Time: 02/18/18  8:19 PM  Result Value Ref Range   Hep B Core Total Ab Negative Negative    Comment: (NOTE) Performed At: [facility] [address] [place], [place] [id number] Rush Farmer MD [id number] Performed at [facility], [place] [address], [place], [place] [address]   Glucose, capillary     Status: Abnormal   Collection Time: 02/18/18 11:54 PM  Result Value Ref Range   Glucose-Capillary 113 (H) 65 - 99 mg/dL   Comment 1 Capillary Specimen   Procalcitonin     Status: None   Collection Time: 02/19/18  2:52 AM  Result Value Ref Range   Procalcitonin 17.98 ng/mL    Comment:        Interpretation: PCT >= 10 ng/mL: Important systemic inflammatory response, almost exclusively due to severe bacterial sepsis or septic shock. (NOTE)       Sepsis PCT Algorithm           Lower Respiratory Tract                                      Infection PCT Algorithm    ----------------------------     ----------------------------         PCT < 0.25 ng/mL                PCT < 0.10 ng/mL         Strongly encourage             Strongly discourage   discontinuation of antibiotics    initiation of antibiotics    ----------------------------     -----------------------------       PCT 0.25 - 0.50 ng/mL            PCT 0.10 - 0.25 ng/mL               OR       >80% decrease in PCT            Discourage initiation of                                            antibiotics      Encourage discontinuation           of antibiotics    ----------------------------     -----------------------------         PCT >= 0.50 ng/mL              PCT 0.26 - 0.50 ng/mL                AND       <80% decrease in PCT             Encourage initiation of                                              antibiotics  Encourage continuation           of antibiotics    ----------------------------     -----------------------------        PCT >= 0.50 ng/mL                  PCT > 0.50 ng/mL               AND         increase in PCT                  Strongly encourage                                      initiation of antibiotics    Strongly encourage escalation           of antibiotics                                     -----------------------------                                           PCT <= 0.25 ng/mL                                                 OR                                        > 80% decrease in PCT                                     Discontinue / Do not initiate                                             antibiotics Performed at [facility], [address] [place], [place] [address]   CBC     Status: Abnormal   Collection Time: 02/19/18  2:52 AM  Result Value Ref Range   WBC 10.7 (H) 4.0 - 10.5 K/uL   RBC 3.34 (L) 4.22 - 5.81 MIL/uL   Hemoglobin 11.1 (L) 13.0 - 17.0 g/dL   HCT 33.6 (L) 39.0 - 52.0 %   MCV 100.6 (H) 78.0 - 100.0 fL   MCH 33.2 26.0 - 34.0 pg   MCHC 33.0 30.0 - 36.0 g/dL   RDW 14.4 11.5 - 15.5 %   Platelets 189 150 - 400 K/uL    Comment: Performed at [facility], [address] [place], [place] [address]  Basic metabolic panel     Status: Abnormal   Collection Time: 02/19/18  2:52 AM  Result Value Ref Range   Sodium 139 135 - 145 mmol/L   Potassium 3.4 (L) 3.5 - 5.1 mmol/L   Chloride 101 101 - 111 mmol/L   CO2   24 22 - 32 mmol/L   Glucose, Bld 116 (H) 65 - 99 mg/dL   BUN 71 (H) 6 - 20 mg/dL   Creatinine, Ser 4.34 (H) 0.61 - 1.24 mg/dL   Calcium 8.6 (L) 8.9 - 10.3 mg/dL   GFR calc non Af Amer 12 (L) >60 mL/min   GFR calc Af Amer 14 (L) >60 mL/min    Comment: (NOTE) The eGFR has been calculated using the CKD EPI equation. This calculation has not been validated in all clinical  situations. eGFR's persistently <60 mL/min signify possible Chronic Kidney Disease.    Anion gap 14 5 - 15    Comment: Performed at [facility] [address] [place], [place] [address]  Magnesium     Status: None   Collection Time: 02/19/18  2:52 AM  Result Value Ref Range   Magnesium 2.2 1.7 - 2.4 mg/dL    Comment: Performed at [facility] [address] [place], [place] [address]  Phosphorus     Status: None   Collection Time: 02/19/18  2:52 AM  Result Value Ref Range   Phosphorus 3.7 2.5 - 4.6 mg/dL    Comment: Performed at [facility], [place] [address] [place], [place] [address]  Glucose, capillary     Status: Abnormal   Collection Time: 02/19/18  2:58 AM  Result Value Ref Range   Glucose-Capillary 111 (H) 65 - 99 mg/dL   Comment 1 Capillary Specimen   Blood gas, arterial     Status: Abnormal   Collection Time: 02/19/18  4:50 AM  Result Value Ref Range   FIO2 40.00    Delivery systems VENTILATOR    Mode PRESSURE REGULATED VOLUME CONTROL    VT 660 mL   LHR 20 resp/min   Peep/cpap 5.0 cm H20   pH, Arterial 7.531 (H) 7.350 - 7.450   pCO2 arterial 27.8 (L) 32.0 - 48.0 mmHg   pO2, Arterial 156 (H) 83.0 - 108.0 mmHg   Bicarbonate 23.1 20.0 - 28.0 mmol/L   Acid-Base Excess 0.6 0.0 - 2.0 mmol/L   O2 Saturation 99.3 %   Patient temperature 99.1    Collection site RIGHT RADIAL    Drawn by [phone]    Sample type ARTERIAL DRAW    Allens test (pass/fail) PASS PASS  Glucose, capillary     Status: Abnormal   Collection Time: 02/19/18  7:58 AM  Result Value Ref Range   Glucose-Capillary 127 (H) 65 - 99 mg/dL   Comment 1 Capillary Specimen   Glucose, capillary     Status: None   Collection Time: 02/19/18 11:49 AM  Result Value Ref Range   Glucose-Capillary 90 65 - 99 mg/dL   Comment 1 Capillary Specimen   Glucose, capillary     Status: Abnormal   Collection Time: 02/19/18  3:56 PM  Result Value Ref Range   Glucose-Capillary 113 (H) 65 - 99 mg/dL    Comment 1 Capillary Specimen   Glucose, capillary     Status: Abnormal   Collection Time: 02/19/18  8:12 PM  Result Value Ref Range   Glucose-Capillary 121 (H) 65 - 99 mg/dL   Comment 1 Capillary Specimen   Glucose, capillary     Status: Abnormal   Collection Time: 02/19/18 11:52 PM  Result Value Ref Range   Glucose-Capillary 115 (H) 65 - 99 mg/dL   Comment 1 Capillary Specimen   CBC     Status: Abnormal   Collection Time: 02/20/18  3:05 AM  Result Value Ref Range   WBC 10.8 (H) 4.0 - 10.5 K/uL   RBC 3.30 (L) 4.22 - 5.81 MIL/uL   Hemoglobin 10.7 (L) 13.0 - 17.0 g/dL   HCT 33.1 (L) 39.0 - 52.0 %   MCV 100.3 (H) 78.0 - 100.0 fL   MCH 32.4 26.0 - 34.0 pg   MCHC 32.3 30.0 - 36.0 g/dL   RDW 14.1 11.5 - 15.5 %   Platelets 186 150 - 400 K/uL    Comment: Performed at [facility] [address], [place], [place] [address]  Basic metabolic panel     Status: Abnormal   Collection Time: 02/20/18  3:05 AM  Result Value Ref Range   Sodium 139 135 - 145 mmol/L   Potassium 3.4 (L) 3.5 - 5.1 mmol/L   Chloride 101 101 - 111 mmol/L   CO2 24 22 - 32 mmol/L   Glucose, Bld 116 (H) 65 - 99 mg/dL   BUN 78 (H) 6 - 20 mg/dL   Creatinine, Ser 5.06 (H) 0.61 - 1.24 mg/dL   Calcium 8.9 8.9 - 10.3 mg/dL   GFR calc non Af Amer 10 (L) >60 mL/min   GFR calc Af Amer 12 (L) >60 mL/min    Comment: (NOTE) The eGFR has been calculated using the CKD EPI equation. This calculation has not been validated in all clinical situations. eGFR's persistently <60 mL/min signify possible Chronic Kidney Disease.    Anion gap 14 5 - 15    Comment: Performed at [facility] [address] [place], [place] [address]  Magnesium     Status: None   Collection Time: 02/20/18  3:05 AM  Result Value Ref Range   Magnesium 2.2 1.7 - 2.4 mg/dL    Comment: Performed at [facility] [address], [place],  [address]  Phosphorus     Status: Abnormal   Collection Time: 02/20/18  3:05 AM  Result Value  Ref Range   Phosphorus 6.0 (H) 2.5 - 4.6 mg/dL    Comment: Performed at [facility] [address], [place], [place] [address]  Glucose, capillary     Status: Abnormal   Collection Time: 02/20/18  3:41 AM  Result Value Ref Range   Glucose-Capillary 118 (H) 65 - 99 mg/dL   Comment 1 Capillary Specimen   Glucose, capillary     Status: Abnormal   Collection Time: 02/20/18  7:50 AM  Result Value Ref Range   Glucose-Capillary 114 (H) 65 - 99 mg/dL   Comment 1 Capillary Specimen     ECG   N/A  Telemetry   NSR - Personally Reviewed  Radiology    Ir Fluoro Guide Cv Line Right  Result Date: 02/18/2018 CLINICAL DATA:  End-stage renal disease, needs durable venous access for hemodialysis EXAM: TUNNELED HEMODIALYSIS CATHETER PLACEMENT WITH ULTRASOUND AND FLUOROSCOPIC GUIDANCE TECHNIQUE: The procedure, risks, benefits, and alternatives were explained to the spouse. Questions regarding the procedure were encouraged and answered. The spouse understands and consents to the procedure. As antibiotic prophylaxis, cefazolin 2 g was ordered pre-procedure and administered intravenously within one hour of incision.Patency of the right IJ vein was confirmed with ultrasound with image documentation. An appropriate skin site was determined. Region was prepped using maximum barrier technique including cap and mask, sterile gown, sterile gloves, large sterile sheet, and Chlorhexidine as cutaneous antisepsis. The region was infiltrated locally with 1% lidocaine. Intravenous Fentanyl and Versed were administered as conscious sedation  during continuous monitoring of the patient's level of consciousness and physiological / cardiorespiratory status by the radiology RN, with a total moderate sedation time of 10 minutes. Under real-time ultrasound guidance, the right IJ vein was accessed with a 21 gauge micropuncture needle; the needle tip within the vein was confirmed with ultrasound image documentation. Needle  exchanged over the 018 guidewire for transitional dilator, which allowed advancement of a Benson wire into the IVC. Over this, an MPA catheter was advanced. A Palindrome 23 hemodialysis catheter was tunneled from the right anterior chest wall approach to the right IJ dermatotomy site. The MPA catheter was exchanged over an Amplatz wire for serial vascular dilators which allow placement of a peel-away sheath, through which the catheter was advanced under intermittent fluoroscopy, positioned with its tips in the proximal and midright atrium. Spot chest radiograph confirms good catheter position. No pneumothorax. Catheter was flushed and primed per protocol. Catheter secured externally with O Prolene sutures. The right IJ dermatotomy site was closed with Dermabond. COMPLICATIONS: COMPLICATIONS None immediate FLUOROSCOPY TIME:  1 minutes; 9 mGy COMPARISON:  None IMPRESSION: 1. Technically successful placement of tunneled right IJ hemodialysis catheter with ultrasound and fluoroscopic guidance. Ready for routine use. ACCESS: Remains approachable for percutaneous intervention as needed. Electronically Signed   By: Lucrezia Europe M.D.   On: 02/18/2018 16:39   Ir US Guide Vasc Access Right  Result Date: 02/18/2018 CLINICAL DATA:  End-stage renal disease, needs durable venous access for hemodialysis EXAM: TUNNELED HEMODIALYSIS CATHETER PLACEMENT WITH ULTRASOUND AND FLUOROSCOPIC GUIDANCE TECHNIQUE: The procedure, risks, benefits, and alternatives were explained to the spouse. Questions regarding the procedure were encouraged and answered. The spouse understands and consents to the procedure. As antibiotic prophylaxis, cefazolin 2 g was ordered pre-procedure and administered intravenously within one hour of incision.Patency of the right IJ vein was confirmed with ultrasound with image documentation. An appropriate skin site was determined. Region was prepped using maximum barrier technique including cap and mask, sterile gown,  sterile gloves, large sterile sheet, and Chlorhexidine as cutaneous antisepsis. The region was infiltrated locally with 1% lidocaine. Intravenous Fentanyl and Versed were administered as conscious sedation during continuous monitoring of the patient's level of consciousness and physiological / cardiorespiratory status by the radiology RN, with a total moderate sedation time of 10 minutes. Under real-time ultrasound guidance, the right IJ vein was accessed with a 21 gauge micropuncture needle; the needle tip within the vein was confirmed with ultrasound image documentation. Needle exchanged over the 018 guidewire for transitional dilator, which allowed advancement of a Benson wire into the IVC. Over this, an MPA catheter was advanced. A Palindrome 23 hemodialysis catheter was tunneled from the right anterior chest wall approach to the right IJ dermatotomy site. The MPA catheter was exchanged over an Amplatz wire for serial vascular dilators which allow placement of a peel-away sheath, through which the catheter was advanced under intermittent fluoroscopy, positioned with its tips in the proximal and midright atrium. Spot chest radiograph confirms good catheter position. No pneumothorax. Catheter was flushed and primed per protocol. Catheter secured externally with O Prolene sutures. The right IJ dermatotomy site was closed with Dermabond. COMPLICATIONS: COMPLICATIONS None immediate FLUOROSCOPY TIME:  1 minutes; 9 mGy COMPARISON:  None IMPRESSION: 1. Technically successful placement of tunneled right IJ hemodialysis catheter with ultrasound and fluoroscopic guidance. Ready for routine use. ACCESS: Remains approachable for percutaneous intervention as needed. Electronically Signed   By: Lucrezia Europe M.D.   On: 02/18/2018 16:39   Dg Chest [facility]  1 View  Result Date: 02/19/2018 CLINICAL DATA:  Acute on chronic CHF, COPD, respiratory arrest and cardiac arrest. End-stage renal disease, HIV. EXAM: PORTABLE CHEST 1 VIEW  COMPARISON:  Chest x-ray of February 18, 2018 FINDINGS: There has been interval placement of a dual-lumen dialysis catheter whose tip projects at the right cavoatrial junction. The lungs are well-expanded. The interstitial markings are slightly less conspicuous overall today. There is no significant pleural effusion. The cardiac silhouette is enlarged. The pulmonary vascularity is not engorged. The endotracheal tube tip lies 5.9 cm above the carina. The esophagogastric tube tip in proximal port project below the GE junction. IMPRESSION: Interval placement of a dialysis catheter via the right internal jugular approach without postprocedure complication. Stable cardiomegaly with mild improvement in interstitial edema. The support tubes are in reasonable position. Electronically Signed   By: David  Martinique M.D.   On: 02/19/2018 07:31    Cardiac Studies   LV EF: 15% -   20%  ------------------------------------------------------------------- History:   PMH:  Cardiac arrest.  Risk factors:  HIV. NICM. CKD. Hypertension.  ------------------------------------------------------------------- Study Conclusions  - Left ventricle: The cavity size was moderately dilated. There was   moderate concentric hypertrophy. Systolic function was normal.   The estimated ejection fraction was in the range of 15% to 20%.   Doppler parameters are consistent with abnormal left ventricular   relaxation (grade 1 diastolic dysfunction). - Ventricular septum: Septal motion showed paradox. - Aortic valve: There was mild regurgitation. - Left atrium: The atrium was mildly dilated.  Impressions:  - No prior study available for comparison. LV is moderately dilated   with severely decreased LVEF estimated at 15-20%. There is   diffuse hypokinesis with akinesis at the inferior and   inferolateral walls. RV is poorly visualized but appears to have   normal size and systolic function.  Assessment   Principal Problem:    HIV (human immunodeficiency virus infection) ([facility] [place]) Active Problems:   Respiratory arrest ([facility])   Acute pulmonary edema (HCC)   Acute on chronic systolic heart failure (HCC)   Chronic obstructive pulmonary disease (HCC)   Cardiac arrest (HCC)   Acute on chronic systolic and diastolic heart failure, NYHA class 4 (HCC)   ESRD (end stage renal disease) ([place])   Acute respiratory failure with hypoxemia ([facility])   Plan   Cardiac arrest with neurologic recovery - dialyzed yesterday. Extubated and looks better today. BP improved - can likely stop midodrine. D/w nephrology - ok for [place] to pursue cardiac cath today. Will arrange - plans likely for permanent dialysis access next week.  Time Spent Directly with Patient:  I have spent a total of 25 minutes with the patient reviewing hospital notes, telemetry, EKGs, labs and examining the patient as well as establishing an assessment and plan that was discussed personally with the patient. > 50% of time was spent in direct patient care.  Length of Stay:  LOS: 3 days   Pixie Casino, MD, [facility], [place] Director of the Advanced Lipid Disorders &  Cardiovascular Risk Reduction Clinic Diplomate of the American Board of Clinical Lipidology Attending Cardiologist  Direct Dial: [phone]  Fax: [phone]  Website:  [email or web address] Reford Olliff 02/20/2018, 8:29 AM

## 2018-02-20 NOTE — Interval H&P Note (Signed)
Cath Lab Visit (complete for each Cath Lab visit)  Clinical Evaluation Leading to the Procedure:   ACS: Yes.    Non-ACS:    Anginal Classification: CCS IV  Anti-ischemic medical therapy: Minimal Therapy (1 class of medications)  Non-Invasive Test Results: No non-invasive testing performed  Prior CABG: No previous CABG      History and Physical Interval Note:  02/20/2018 4:34 PM  David [name]  has presented today for surgery, with the diagnosis of cardiac arrest  The various methods of treatment have been discussed with the patient and family. After consideration of risks, benefits and other options for treatment, the patient has consented to  Procedure(s): LEFT HEART CATH AND CORONARY ANGIOGRAPHY (N/A) as a surgical intervention .  The patient's history has been reviewed, patient examined, no change in status, stable for surgery.  I have reviewed the patient's chart and labs.  Questions were answered to the patient's satisfaction.     Larae Grooms

## 2018-02-20 NOTE — H&P (View-Only) (Signed)
DAILY PROGRESS NOTE   Patient Name: David [name] Date of Encounter: 02/20/2018  Chief Complaint   Extubated, sitting up in a chair  Patient Profile   David [name] is a 77 y.o. 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 Lake Bells.  Subjective   Extubated - looks better. New start for dialysis. LVEF on echo is lower at 15-20%. Volume management per nephrology. Will need ischemia evaluation.  Objective   Vitals:   02/20/18 0417 02/20/18 0500 02/20/18 0600 02/20/18 0746  BP:  (!) 121/93 136/80 128/74  Pulse:  82 87   Resp:  18 (!) 21   Temp: 99.4 F (37.4 C)   98.6 F (37 C)  TempSrc: Oral   Oral  SpO2:  100% 100%   Weight:  195 lb 8.8 oz (88.7 kg)    Height:        Intake/Output Summary (Last 24 hours) at 02/20/2018 4174 Last data filed at 02/20/2018 0600 Gross per 24 hour  Intake 206 ml  Output 1780 ml  Net -1574 ml   Filed Weights   02/19/18 0130 02/19/18 0238 02/20/18 0500  Weight: 202 lb 13.2 oz (92 kg) 198 lb 6.6 oz (90 kg) 195 lb 8.8 oz (88.7 kg)    Physical Exam   General appearance: alert, no distress and sitting up in a chair Neck: no carotid bruit, no JVD and thyroid not enlarged, symmetric, no tenderness/mass/nodules Lungs: clear to auscultation bilaterally Heart: regular rate and rhythm and no murmur Abdomen: soft, non-tender; bowel sounds normal; no masses,  no organomegaly Extremities: extremities normal, atraumatic, no cyanosis or edema Pulses: 2+ and symmetric Skin: Skin color, texture, turgor normal. No rashes or lesions Neurologic: Mental status: Alert, oriented, thought content appropriate Psych: flat affect  Inpatient Medications    Scheduled Meds: . carvedilol  6.25 mg Oral BID WC  . Chlorhexidine Gluconate Cloth  6 each Topical Daily  .  dolutegravir  50 mg Oral Daily  . emtricitabine-tenofovir AF  1 tablet Oral Daily  . heparin injection (subcutaneous)  5,000 Units Subcutaneous Q8H  . mouth rinse  15 mL Mouth Rinse BID  . midodrine  5 mg Oral TID WC  . mupirocin ointment   Nasal BID  . pantoprazole (PROTONIX) IV  40 mg Intravenous QHS  . scopolamine  1 patch Transdermal Q72H  . sodium chloride flush  10-40 mL Intracatheter Q12H    Continuous Infusions: . sodium chloride Stopped (02/19/18 1100)  . sodium chloride    . sodium chloride      PRN Meds: sodium chloride, sodium chloride, sodium chloride, alteplase, heparin, heparin, lidocaine (PF), lidocaine-prilocaine, midazolam, midazolam, pentafluoroprop-tetrafluoroeth, sodium chloride flush   Labs   Results for orders placed or performed during the hospital encounter of 02/17/18 (from the past 48 hour(s))  Glucose, capillary     Status: Abnormal   Collection Time: 02/18/18 12:18 PM  Result Value Ref Range   Glucose-Capillary 110 (H) 65 - 99 mg/dL   Comment 1 Capillary Specimen   Glucose, capillary     Status: None   Collection Time: 02/18/18  3:39 PM  Result Value Ref Range   Glucose-Capillary 76 65 - 99 mg/dL   Comment 1 Capillary Specimen   Magnesium     Status: None   Collection Time: 02/18/18  6:40 PM  Result Value Ref Range   Magnesium 2.3 1.7 - 2.4 mg/dL    Comment: Performed at [facility], [place] [address], [place], [place] [address]  Phosphorus     Status: None   Collection Time: 02/18/18  6:40 PM  Result Value Ref Range   Phosphorus 3.8 2.5 - 4.6 mg/dL    Comment: Performed at [facility] [address] [place], [place] [address]  Renal function panel     Status: Abnormal   Collection Time: 02/18/18  6:40 PM  Result Value Ref Range   Sodium 139 135 - 145 mmol/L   Potassium 3.3 (L) 3.5 - 5.1 mmol/L   Chloride 104 101 - 111 mmol/L   CO2 24 22 - 32 mmol/L   Glucose, Bld 115 (H) 65 - 99 mg/dL   BUN 102 (H) 6 - 20 mg/dL    Creatinine, Ser 5.60 (H) 0.61 - 1.24 mg/dL   Calcium 8.5 (L) 8.9 - 10.3 mg/dL   Phosphorus 3.9 2.5 - 4.6 mg/dL   Albumin 3.1 (L) 3.5 - 5.0 g/dL   GFR calc non Af Amer 9 (L) >60 mL/min   GFR calc Af Amer 10 (L) >60 mL/min    Comment: (NOTE) The eGFR has been calculated using the CKD EPI equation. This calculation has not been validated in all clinical situations. eGFR's persistently <60 mL/min signify possible Chronic Kidney Disease.    Anion gap 11 5 - 15    Comment: Performed at [facility] [address] [place], [place] [address]  Glucose, capillary     Status: Abnormal   Collection Time: 02/18/18  8:07 PM  Result Value Ref Range   Glucose-Capillary 107 (H) 65 - 99 mg/dL   Comment 1 Capillary Specimen   CBC     Status: Abnormal   Collection Time: 02/18/18  8:19 PM  Result Value Ref Range   WBC 11.6 (H) 4.0 - 10.5 K/uL   RBC 3.32 (L) 4.22 - 5.81 MIL/uL   Hemoglobin 10.3 (L) 13.0 - 17.0 g/dL   HCT 32.8 (L) 39.0 - 52.0 %   MCV 98.8 78.0 - 100.0 fL   MCH 31.0 26.0 - 34.0 pg   MCHC 31.4 30.0 - 36.0 g/dL   RDW 14.1 11.5 - 15.5 %   Platelets 183 150 - 400 K/uL    Comment: Performed at [facility], [place] [address], [place], [place] [address]  Hepatitis B surface antigen     Status: None   Collection Time: 02/18/18  8:19 PM  Result Value Ref Range   Hepatitis B Surface Ag Negative Negative    Comment: (NOTE) Performed At: [facility] [address] [place], [place] [id number] Rush Farmer MD [id number] Performed at [facility], [place] [address], [place], [place] [address]   Hepatitis B surface antibody     Status: None   Collection Time: 02/18/18  8:19 PM  Result Value Ref Range   Hep B S Ab Non Reactive     Comment: (NOTE)              Non Reactive: Inconsistent with immunity,                            less than 10 mIU/mL              Reactive:     Consistent with immunity,  greater than 9.9 mIU/mL Performed  At: [facility] [place], [place] [id number] Rush Farmer MD [id number] Performed at [facility], [place] [address], [place], [place] [address]   Hepatitis B core antibody, total     Status: None   Collection Time: 02/18/18  8:19 PM  Result Value Ref Range   Hep B Core Total Ab Negative Negative    Comment: (NOTE) Performed At: [facility] [address] [place], [place] [id number] Rush Farmer MD [id number] Performed at [facility], [place] [address], [place],  [address]   Glucose, capillary     Status: Abnormal   Collection Time: 02/18/18 11:54 PM  Result Value Ref Range   Glucose-Capillary 113 (H) 65 - 99 mg/dL   Comment 1 Capillary Specimen   Procalcitonin     Status: None   Collection Time: 02/19/18  2:52 AM  Result Value Ref Range   Procalcitonin 17.98 ng/mL    Comment:        Interpretation: PCT >= 10 ng/mL: Important systemic inflammatory response, almost exclusively due to severe bacterial sepsis or septic shock. (NOTE)       Sepsis PCT Algorithm           Lower Respiratory Tract                                      Infection PCT Algorithm    ----------------------------     ----------------------------         PCT < 0.25 ng/mL                PCT < 0.10 ng/mL         Strongly encourage             Strongly discourage   discontinuation of antibiotics    initiation of antibiotics    ----------------------------     -----------------------------       PCT 0.25 - 0.50 ng/mL            PCT 0.10 - 0.25 ng/mL               OR       >80% decrease in PCT            Discourage initiation of                                            antibiotics      Encourage discontinuation           of antibiotics    ----------------------------     -----------------------------         PCT >= 0.50 ng/mL              PCT 0.26 - 0.50 ng/mL                AND       <80% decrease in PCT             Encourage initiation of                                              antibiotics  Encourage continuation           of antibiotics    ----------------------------     -----------------------------        PCT >= 0.50 ng/mL                  PCT > 0.50 ng/mL               AND         increase in PCT                  Strongly encourage                                      initiation of antibiotics    Strongly encourage escalation           of antibiotics                                     -----------------------------                                           PCT <= 0.25 ng/mL                                                 OR                                        > 80% decrease in PCT                                     Discontinue / Do not initiate                                             antibiotics Performed at [facility], [address], [place], [place] [address]   CBC     Status: Abnormal   Collection Time: 02/19/18  2:52 AM  Result Value Ref Range   WBC 10.7 (H) 4.0 - 10.5 K/uL   RBC 3.34 (L) 4.22 - 5.81 MIL/uL   Hemoglobin 11.1 (L) 13.0 - 17.0 g/dL   HCT 33.6 (L) 39.0 - 52.0 %   MCV 100.6 (H) 78.0 - 100.0 fL   MCH 33.2 26.0 - 34.0 pg   MCHC 33.0 30.0 - 36.0 g/dL   RDW 14.4 11.5 - 15.5 %   Platelets 189 150 - 400 K/uL    Comment: Performed at [facility], [place] [address], [place], [place] [address]  Basic metabolic panel     Status: Abnormal   Collection Time: 02/19/18  2:52 AM  Result Value Ref Range   Sodium 139 135 - 145 mmol/L   Potassium 3.4 (L) 3.5 - 5.1 mmol/L   Chloride 101 101 - 111 mmol/L   CO2  24 22 - 32 mmol/L   Glucose, Bld 116 (H) 65 - 99 mg/dL   BUN 71 (H) 6 - 20 mg/dL   Creatinine, Ser 4.34 (H) 0.61 - 1.24 mg/dL   Calcium 8.6 (L) 8.9 - 10.3 mg/dL   GFR calc non Af Amer 12 (L) >60 mL/min   GFR calc Af Amer 14 (L) >60 mL/min    Comment: (NOTE) The eGFR has been calculated using the CKD EPI equation. This calculation has not been validated in all clinical  situations. eGFR's persistently <60 mL/min signify possible Chronic Kidney Disease.    Anion gap 14 5 - 15    Comment: Performed at [facility] [address] [place], [place] [address]  Magnesium     Status: None   Collection Time: 02/19/18  2:52 AM  Result Value Ref Range   Magnesium 2.2 1.7 - 2.4 mg/dL    Comment: Performed at [facility] [address] [place],  [address]  Phosphorus     Status: None   Collection Time: 02/19/18  2:52 AM  Result Value Ref Range   Phosphorus 3.7 2.5 - 4.6 mg/dL    Comment: Performed at [facility], [place] [address] [place], [place] [address]  Glucose, capillary     Status: Abnormal   Collection Time: 02/19/18  2:58 AM  Result Value Ref Range   Glucose-Capillary 111 (H) 65 - 99 mg/dL   Comment 1 Capillary Specimen   Blood gas, arterial     Status: Abnormal   Collection Time: 02/19/18  4:50 AM  Result Value Ref Range   FIO2 40.00    Delivery systems VENTILATOR    Mode PRESSURE REGULATED VOLUME CONTROL    VT 660 mL   LHR 20 resp/min   Peep/cpap 5.0 cm H20   pH, Arterial 7.531 (H) 7.350 - 7.450   pCO2 arterial 27.8 (L) 32.0 - 48.0 mmHg   pO2, Arterial 156 (H) 83.0 - 108.0 mmHg   Bicarbonate 23.1 20.0 - 28.0 mmol/L   Acid-Base Excess 0.6 0.0 - 2.0 mmol/L   O2 Saturation 99.3 %   Patient temperature 99.1    Collection site RIGHT RADIAL    Drawn by [phone]    Sample type ARTERIAL DRAW    Allens test (pass/fail) PASS PASS  Glucose, capillary     Status: Abnormal   Collection Time: 02/19/18  7:58 AM  Result Value Ref Range   Glucose-Capillary 127 (H) 65 - 99 mg/dL   Comment 1 Capillary Specimen   Glucose, capillary     Status: None   Collection Time: 02/19/18 11:49 AM  Result Value Ref Range   Glucose-Capillary 90 65 - 99 mg/dL   Comment 1 Capillary Specimen   Glucose, capillary     Status: Abnormal   Collection Time: 02/19/18  3:56 PM  Result Value Ref Range   Glucose-Capillary 113 (H) 65 - 99 mg/dL    Comment 1 Capillary Specimen   Glucose, capillary     Status: Abnormal   Collection Time: 02/19/18  8:12 PM  Result Value Ref Range   Glucose-Capillary 121 (H) 65 - 99 mg/dL   Comment 1 Capillary Specimen   Glucose, capillary     Status: Abnormal   Collection Time: 02/19/18 11:52 PM  Result Value Ref Range   Glucose-Capillary 115 (H) 65 - 99 mg/dL   Comment 1 Capillary Specimen   CBC     Status: Abnormal   Collection Time: 02/20/18  3:05 AM  Result Value Ref Range   WBC 10.8 (H) 4.0 - 10.5 K/uL   RBC 3.30 (L) 4.22 - 5.81 MIL/uL   Hemoglobin 10.7 (L) 13.0 - 17.0 g/dL   HCT 33.1 (L) 39.0 - 52.0 %   MCV 100.3 (H) 78.0 - 100.0 fL   MCH 32.4 26.0 - 34.0 pg   MCHC 32.3 30.0 - 36.0 g/dL   RDW 14.1 11.5 - 15.5 %   Platelets 186 150 - 400 K/uL    Comment: Performed at [facility] [address], [place], [place] [address]  Basic metabolic panel     Status: Abnormal   Collection Time: 02/20/18  3:05 AM  Result Value Ref Range   Sodium 139 135 - 145 mmol/L   Potassium 3.4 (L) 3.5 - 5.1 mmol/L   Chloride 101 101 - 111 mmol/L   CO2 24 22 - 32 mmol/L   Glucose, Bld 116 (H) 65 - 99 mg/dL   BUN 78 (H) 6 - 20 mg/dL   Creatinine, Ser 5.06 (H) 0.61 - 1.24 mg/dL   Calcium 8.9 8.9 - 10.3 mg/dL   GFR calc non Af Amer 10 (L) >60 mL/min   GFR calc Af Amer 12 (L) >60 mL/min    Comment: (NOTE) The eGFR has been calculated using the CKD EPI equation. This calculation has not been validated in all clinical situations. eGFR's persistently <60 mL/min signify possible Chronic Kidney Disease.    Anion gap 14 5 - 15    Comment: Performed at [facility] [address] [place], [place] [address]  Magnesium     Status: None   Collection Time: 02/20/18  3:05 AM  Result Value Ref Range   Magnesium 2.2 1.7 - 2.4 mg/dL    Comment: Performed at [facility] [address], [place], [place] [address]  Phosphorus     Status: Abnormal   Collection Time: 02/20/18  3:05 AM  Result Value  Ref Range   Phosphorus 6.0 (H) 2.5 - 4.6 mg/dL    Comment: Performed at [facility] [address], [place], [place] [address]  Glucose, capillary     Status: Abnormal   Collection Time: 02/20/18  3:41 AM  Result Value Ref Range   Glucose-Capillary 118 (H) 65 - 99 mg/dL   Comment 1 Capillary Specimen   Glucose, capillary     Status: Abnormal   Collection Time: 02/20/18  7:50 AM  Result Value Ref Range   Glucose-Capillary 114 (H) 65 - 99 mg/dL   Comment 1 Capillary Specimen     ECG   N/A  Telemetry   NSR - Personally Reviewed  Radiology    Ir Fluoro Guide Cv Line Right  Result Date: 02/18/2018 CLINICAL DATA:  End-stage renal disease, needs durable venous access for hemodialysis EXAM: TUNNELED HEMODIALYSIS CATHETER PLACEMENT WITH ULTRASOUND AND FLUOROSCOPIC GUIDANCE TECHNIQUE: The procedure, risks, benefits, and alternatives were explained to the spouse. Questions regarding the procedure were encouraged and answered. The spouse understands and consents to the procedure. As antibiotic prophylaxis, cefazolin 2 g was ordered pre-procedure and administered intravenously within one hour of incision.Patency of the right IJ vein was confirmed with ultrasound with image documentation. An appropriate skin site was determined. Region was prepped using maximum barrier technique including cap and mask, sterile gown, sterile gloves, large sterile sheet, and Chlorhexidine as cutaneous antisepsis. The region was infiltrated locally with 1% lidocaine. Intravenous Fentanyl and Versed were administered as conscious sedation  during continuous monitoring of the patient's level of consciousness and physiological / cardiorespiratory status by the radiology RN, with a total moderate sedation time of 10 minutes. Under real-time ultrasound guidance, the right IJ vein was accessed with a 21 gauge micropuncture needle; the needle tip within the vein was confirmed with ultrasound image documentation. Needle  exchanged over the 018 guidewire for transitional dilator, which allowed advancement of a Benson wire into the IVC. Over this, an MPA catheter was advanced. A Palindrome 23 hemodialysis catheter was tunneled from the right anterior chest wall approach to the right IJ dermatotomy site. The MPA catheter was exchanged over an Amplatz wire for serial vascular dilators which allow placement of a peel-away sheath, through which the catheter was advanced under intermittent fluoroscopy, positioned with its tips in the proximal and midright atrium. Spot chest radiograph confirms good catheter position. No pneumothorax. Catheter was flushed and primed per protocol. Catheter secured externally with O Prolene sutures. The right IJ dermatotomy site was closed with Dermabond. COMPLICATIONS: COMPLICATIONS None immediate FLUOROSCOPY TIME:  1 minutes; 9 mGy COMPARISON:  None IMPRESSION: 1. Technically successful placement of tunneled right IJ hemodialysis catheter with ultrasound and fluoroscopic guidance. Ready for routine use. ACCESS: Remains approachable for percutaneous intervention as needed. Electronically Signed   By: Lucrezia Europe M.D.   On: 02/18/2018 16:39   Ir US Guide Vasc Access Right  Result Date: 02/18/2018 CLINICAL DATA:  End-stage renal disease, needs durable venous access for hemodialysis EXAM: TUNNELED HEMODIALYSIS CATHETER PLACEMENT WITH ULTRASOUND AND FLUOROSCOPIC GUIDANCE TECHNIQUE: The procedure, risks, benefits, and alternatives were explained to the spouse. Questions regarding the procedure were encouraged and answered. The spouse understands and consents to the procedure. As antibiotic prophylaxis, cefazolin 2 g was ordered pre-procedure and administered intravenously within one hour of incision.Patency of the right IJ vein was confirmed with ultrasound with image documentation. An appropriate skin site was determined. Region was prepped using maximum barrier technique including cap and mask, sterile gown,  sterile gloves, large sterile sheet, and Chlorhexidine as cutaneous antisepsis. The region was infiltrated locally with 1% lidocaine. Intravenous Fentanyl and Versed were administered as conscious sedation during continuous monitoring of the patient's level of consciousness and physiological / cardiorespiratory status by the radiology RN, with a total moderate sedation time of 10 minutes. Under real-time ultrasound guidance, the right IJ vein was accessed with a 21 gauge micropuncture needle; the needle tip within the vein was confirmed with ultrasound image documentation. Needle exchanged over the 018 guidewire for transitional dilator, which allowed advancement of a Benson wire into the IVC. Over this, an MPA catheter was advanced. A Palindrome 23 hemodialysis catheter was tunneled from the right anterior chest wall approach to the right IJ dermatotomy site. The MPA catheter was exchanged over an Amplatz wire for serial vascular dilators which allow placement of a peel-away sheath, through which the catheter was advanced under intermittent fluoroscopy, positioned with its tips in the proximal and midright atrium. Spot chest radiograph confirms good catheter position. No pneumothorax. Catheter was flushed and primed per protocol. Catheter secured externally with O Prolene sutures. The right IJ dermatotomy site was closed with Dermabond. COMPLICATIONS: COMPLICATIONS None immediate FLUOROSCOPY TIME:  1 minutes; 9 mGy COMPARISON:  None IMPRESSION: 1. Technically successful placement of tunneled right IJ hemodialysis catheter with ultrasound and fluoroscopic guidance. Ready for routine use. ACCESS: Remains approachable for percutaneous intervention as needed. Electronically Signed   By: Lucrezia Europe M.D.   On: 02/18/2018 16:39   Dg Chest [facility]  1 View  Result Date: 02/19/2018 CLINICAL DATA:  Acute on chronic CHF, COPD, respiratory arrest and cardiac arrest. End-stage renal disease, HIV. EXAM: PORTABLE CHEST 1 VIEW  COMPARISON:  Chest x-ray of February 18, 2018 FINDINGS: There has been interval placement of a dual-lumen dialysis catheter whose tip projects at the right cavoatrial junction. The lungs are well-expanded. The interstitial markings are slightly less conspicuous overall today. There is no significant pleural effusion. The cardiac silhouette is enlarged. The pulmonary vascularity is not engorged. The endotracheal tube tip lies 5.9 cm above the carina. The esophagogastric tube tip in proximal port project below the GE junction. IMPRESSION: Interval placement of a dialysis catheter via the right internal jugular approach without postprocedure complication. Stable cardiomegaly with mild improvement in interstitial edema. The support tubes are in reasonable position. Electronically Signed   By: David  Martinique M.D.   On: 02/19/2018 07:31    Cardiac Studies   LV EF: 15% -   20%  ------------------------------------------------------------------- History:   PMH:  Cardiac arrest.  Risk factors:  HIV. NICM. CKD. Hypertension.  ------------------------------------------------------------------- Study Conclusions  - Left ventricle: The cavity size was moderately dilated. There was   moderate concentric hypertrophy. Systolic function was normal.   The estimated ejection fraction was in the range of 15% to 20%.   Doppler parameters are consistent with abnormal left ventricular   relaxation (grade 1 diastolic dysfunction). - Ventricular septum: Septal motion showed paradox. - Aortic valve: There was mild regurgitation. - Left atrium: The atrium was mildly dilated.  Impressions:  - No prior study available for comparison. LV is moderately dilated   with severely decreased LVEF estimated at 15-20%. There is   diffuse hypokinesis with akinesis at the inferior and   inferolateral walls. RV is poorly visualized but appears to have   normal size and systolic function.  Assessment   Principal Problem:    HIV (human immunodeficiency virus infection) ([facility] [place]) Active Problems:   Respiratory arrest ([facility])   Acute pulmonary edema (HCC)   Acute on chronic systolic heart failure (HCC)   Chronic obstructive pulmonary disease (HCC)   Cardiac arrest (HCC)   Acute on chronic systolic and diastolic heart failure, NYHA class 4 (HCC)   ESRD (end stage renal disease) ([place])   Acute respiratory failure with hypoxemia ([place])   Plan   Cardiac arrest with neurologic recovery - dialyzed yesterday. Extubated and looks better today. BP improved - can likely stop midodrine. D/w nephrology - ok for [place] to pursue cardiac cath today. Will arrange - plans likely for permanent dialysis access next week.  Time Spent Directly with Patient:  I have spent a total of 25 minutes with the patient reviewing hospital notes, telemetry, EKGs, labs and examining the patient as well as establishing an assessment and plan that was discussed personally with the patient. > 50% of time was spent in direct patient care.  Length of Stay:  LOS: 3 days   Pixie Casino, MD, [facility], [place] Director of the Advanced Lipid Disorders &  Cardiovascular Risk Reduction Clinic Diplomate of the American Board of Clinical Lipidology Attending Cardiologist  Direct Dial: [phone]  Fax: [phone]  Website:  [email or web address] Vogel 02/20/2018, 8:29 AM

## 2018-02-20 NOTE — Progress Notes (Signed)
Assessment/Plan:77 year old BM with controlled HIV, EF 20-25 % as well as stage 5 CKD. This is third hosp in 3 mos for pulmonary edema/pulmonary issues   1.New ESRD, s/p [facility] 3/18 Will need eval for AVF. Vein [place] mapping and ask VVS to see.   2. Hypertension/volume- BPs better, stop midodrine 3  VDRF s/p extubation yesterday 4.Bones-PTH 133 5. Hypokalemia  Subjective: Interval History: For heart cath today  Objective: Vital signs in last 24 hours: Temp:  [98.6 F (37 C)-99.9 F (37.7 C)] 98.6 F (37 C) (03/20 0746) Pulse Rate:  [79-99] 87 (03/20 0600) Resp:  [14-25] 21 (03/20 0600) BP: (105-150)/(73-127) 128/74 (03/20 0746) SpO2:  [98 %-100 %] 100 % (03/20 0600) FiO2 (%):  [40 %] 40 % (03/19 1053) Weight:  [88.7 kg (195 lb 8.8 oz)] 88.7 kg (195 lb 8.8 oz) (03/20 0500) Weight change: -4.3 kg (-7.7 oz)  Intake/Output from previous day: 03/19 0701 - 03/20 0700 In: 266 [I.V.:50; NG/GT:150; IV Piggyback:66] Out: 2725 [Urine:1780] Intake/Output this shift: No intake/output data recorded.  General appearance: alert and cooperative Resp: clear to auscultation bilaterally Cardio: regular rate and rhythm, S1, S2 normal, no murmur, click, rub or gallop Extremities: extremities normal, atraumatic, no cyanosis or edema  Lab Results: Recent Labs    02/19/18 0252 02/20/18 0305  WBC 10.7* 10.8*  HGB 11.1* 10.7*  HCT 33.6* 33.1*  PLT 189 186   BMET:  Recent Labs    02/19/18 0252 02/20/18 0305  NA 139 139  K 3.4* 3.4*  CL 101 101  CO2 24 24  GLUCOSE 116* 116*  BUN 71* 78*  CREATININE 4.34* 5.06*  CALCIUM 8.6* 8.9   Recent Labs    02/18/18 0558  PTH 133*   Iron Studies: No results for input(s): IRON, TIBC, TRANSFERRIN, FERRITIN in the last 72 hours. Studies/Results: Ir Fluoro Guide Cv Line Right  Result Date: 02/18/2018 CLINICAL DATA:  End-stage renal disease, needs durable venous access for hemodialysis EXAM: TUNNELED HEMODIALYSIS CATHETER PLACEMENT WITH  ULTRASOUND AND FLUOROSCOPIC GUIDANCE TECHNIQUE: The procedure, risks, benefits, and alternatives were explained to the spouse. Questions regarding the procedure were encouraged and answered. The spouse understands and consents to the procedure. As antibiotic prophylaxis, cefazolin 2 g was ordered pre-procedure and administered intravenously within one hour of incision.Patency of the right IJ vein was confirmed with ultrasound with image documentation. An appropriate skin site was determined. Region was prepped using maximum barrier technique including cap and mask, sterile gown, sterile gloves, large sterile sheet, and Chlorhexidine as cutaneous antisepsis. The region was infiltrated locally with 1% lidocaine. Intravenous Fentanyl and Versed were administered as conscious sedation during continuous monitoring of the patient's level of consciousness and physiological / cardiorespiratory status by the radiology RN, with a total moderate sedation time of 10 minutes. Under real-time ultrasound guidance, the right IJ vein was accessed with a 21 gauge micropuncture needle; the needle tip within the vein was confirmed with ultrasound image documentation. Needle exchanged over the 018 guidewire for transitional dilator, which allowed advancement of a Benson wire into the IVC. Over this, an MPA catheter was advanced. A Palindrome 23 hemodialysis catheter was tunneled from the right anterior chest wall approach to the right IJ dermatotomy site. The MPA catheter was exchanged over an Amplatz wire for serial vascular dilators which allow placement of a peel-away sheath, through which the catheter was advanced under intermittent fluoroscopy, positioned with its tips in the proximal and midright atrium. Spot chest radiograph confirms good catheter position. No  pneumothorax. Catheter was flushed and primed per protocol. Catheter secured externally with O Prolene sutures. The right IJ dermatotomy site was closed with Dermabond.  COMPLICATIONS: COMPLICATIONS None immediate FLUOROSCOPY TIME:  1 minutes; 9 mGy COMPARISON:  None IMPRESSION: 1. Technically successful placement of tunneled right IJ hemodialysis catheter with ultrasound and fluoroscopic guidance. Ready for routine use. ACCESS: Remains approachable for percutaneous intervention as needed. Electronically Signed   By: Lucrezia Europe M.D.   On: 02/18/2018 16:39   Ir US Guide Vasc Access Right  Result Date: 02/18/2018 CLINICAL DATA:  End-stage renal disease, needs durable venous access for hemodialysis EXAM: TUNNELED HEMODIALYSIS CATHETER PLACEMENT WITH ULTRASOUND AND FLUOROSCOPIC GUIDANCE TECHNIQUE: The procedure, risks, benefits, and alternatives were explained to the spouse. Questions regarding the procedure were encouraged and answered. The spouse understands and consents to the procedure. As antibiotic prophylaxis, cefazolin 2 g was ordered pre-procedure and administered intravenously within one hour of incision.Patency of the right IJ vein was confirmed with ultrasound with image documentation. An appropriate skin site was determined. Region was prepped using maximum barrier technique including cap and mask, sterile gown, sterile gloves, large sterile sheet, and Chlorhexidine as cutaneous antisepsis. The region was infiltrated locally with 1% lidocaine. Intravenous Fentanyl and Versed were administered as conscious sedation during continuous monitoring of the patient's level of consciousness and physiological / cardiorespiratory status by the radiology RN, with a total moderate sedation time of 10 minutes. Under real-time ultrasound guidance, the right IJ vein was accessed with a 21 gauge micropuncture needle; the needle tip within the vein was confirmed with ultrasound image documentation. Needle exchanged over the 018 guidewire for transitional dilator, which allowed advancement of a Benson wire into the IVC. Over this, an MPA catheter was advanced. A Palindrome 23 hemodialysis  catheter was tunneled from the right anterior chest wall approach to the right IJ dermatotomy site. The MPA catheter was exchanged over an Amplatz wire for serial vascular dilators which allow placement of a peel-away sheath, through which the catheter was advanced under intermittent fluoroscopy, positioned with its tips in the proximal and midright atrium. Spot chest radiograph confirms good catheter position. No pneumothorax. Catheter was flushed and primed per protocol. Catheter secured externally with O Prolene sutures. The right IJ dermatotomy site was closed with Dermabond. COMPLICATIONS: COMPLICATIONS None immediate FLUOROSCOPY TIME:  1 minutes; 9 mGy COMPARISON:  None IMPRESSION: 1. Technically successful placement of tunneled right IJ hemodialysis catheter with ultrasound and fluoroscopic guidance. Ready for routine use. ACCESS: Remains approachable for percutaneous intervention as needed. Electronically Signed   By: Lucrezia Europe M.D.   On: 02/18/2018 16:39   Dg Chest Port 1 View  Result Date: 02/19/2018 CLINICAL DATA:  Acute on chronic CHF, COPD, respiratory arrest and cardiac arrest. End-stage renal disease, HIV. EXAM: PORTABLE CHEST 1 VIEW COMPARISON:  Chest x-ray of February 18, 2018 FINDINGS: There has been interval placement of a dual-lumen dialysis catheter whose tip projects at the right cavoatrial junction. The lungs are well-expanded. The interstitial markings are slightly less conspicuous overall today. There is no significant pleural effusion. The cardiac silhouette is enlarged. The pulmonary vascularity is not engorged. The endotracheal tube tip lies 5.9 cm above the carina. The esophagogastric tube tip in proximal port project below the GE junction. IMPRESSION: Interval placement of a dialysis catheter via the right internal jugular approach without postprocedure complication. Stable cardiomegaly with mild improvement in interstitial edema. The support tubes are in reasonable position.  Electronically Signed   By: David  Martinique M.D.  On: 02/19/2018 07:31    Scheduled: . calcium acetate  667 mg Oral TID WC  . carvedilol  6.25 mg Oral BID WC  . Chlorhexidine Gluconate Cloth  6 each Topical Daily  . dolutegravir  50 mg Oral Daily  . emtricitabine-tenofovir AF  1 tablet Oral Daily  . heparin injection (subcutaneous)  5,000 Units Subcutaneous Q8H  . mouth rinse  15 mL Mouth Rinse BID  . mupirocin ointment   Nasal BID  . pantoprazole (PROTONIX) IV  40 mg Intravenous QHS  . scopolamine  1 patch Transdermal Q72H  . sodium chloride flush  10-40 mL Intracatheter Q12H     LOS: 3 days   Estanislado Emms 02/20/2018,10:29 AM

## 2018-02-20 NOTE — Evaluation (Signed)
Clinical/Bedside Swallow Evaluation Patient Details  Name: David [name] MRN: [id number] Date of Birth: 04/08/1941  Today's Date: 02/20/2018 Time: SLP Start Time (ACUTE ONLY): 8563 SLP Stop Time (ACUTE ONLY): 0824 SLP Time Calculation (min) (ACUTE ONLY): 10 min  Past Medical History:  Past Medical History:  Diagnosis Date  . Acute on chronic systolic and diastolic heart failure, NYHA class 4 ([place])   . CKD (chronic kidney disease) stage V requiring chronic dialysis ([place])   . HIV (human immunodeficiency virus infection) ([place])   . Prostate cancer [facility])    Past Surgical History:  Past Surgical History:  Procedure Laterality Date  . IR FLUORO GUIDE CV LINE RIGHT  02/18/2018  . 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 Presents to ED 3/17 with dry cough, severe shortness of breath and became agonal and pulseless in parking lot. CXR 3/18 Persistent bilateral pneumonia. Slight increased opacity noted on the left today. Intubated 3/17-3/19.   Assessment / Plan / Recommendation Clinical Impression  Pt's vocal quality within functional limits, cough mildly weak, no audible pharyngeal or chest respirations. Pt alert, cognitively aware; tremorous facial and lingual movements. S/S aspiration absent during assessment across textures after 2-3 day intubation. Consumed 3 oz water with ease. Recommend/ordered regular texture/thin liquids, pills with water. No further ST needed.  SLP Visit Diagnosis: Dysphagia, unspecified (R13.10)    Aspiration Risk  Mild aspiration risk    Diet Recommendation Regular;Thin liquid   Liquid Administration via: Cup;Straw Medication Administration: Whole meds with liquid Supervision: Patient able to self feed Compensations: Slow rate;Small  sips/bites Postural Changes: Seated upright at 90 degrees    Other  Recommendations Oral Care Recommendations: Oral care BID   Follow up Recommendations None      Frequency and Duration            Prognosis        Swallow Study   General HPI: 77 year old male with PMH ofHIV/AIDS, prostate cancer status post brachytherapy, chronic kidney disease stage V not on dialysis, nonischemic cardiomyopathy with EF 02-63% and diastolic dysfunction, and hypertension. Presents to ED 3/17 with dry cough, severe shortness of breath and became agonal and pulseless in parking lot. CXR 3/18 Persistent bilateral pneumonia. Slight increased opacity noted on the left today. Intubated 3/17-3/19. Type of Study: Bedside Swallow Evaluation Previous Swallow Assessment: (none) Diet Prior to this Study: NPO Temperature Spikes Noted: No Respiratory Status: Nasal cannula History of Recent Intubation: Yes Length of Intubations (days): 3 days Date extubated: 02/18/18 Behavior/Cognition: Alert;Cooperative;Pleasant mood Oral Cavity Assessment: Within Functional Limits Oral Care Completed by SLP: Yes Oral Cavity - Dentition: Adequate natural dentition Vision: Functional for self-feeding Self-Feeding Abilities: Able to feed self;Needs set up Patient Positioning: Upright in chair Baseline Vocal Quality: Normal Volitional Cough: Weak Volitional Swallow: Able to elicit    Oral/Motor/Sensory Function Overall Oral Motor/Sensory Function: (tremorous lingual and facial movements)   Ice Chips Ice chips: Not tested   Thin Liquid Thin Liquid: Within functional limits Presentation: Cup;Straw    Nectar Thick Nectar Thick Liquid: Not tested   Honey Thick Honey Thick Liquid: Not tested   Puree Puree: Within functional limits   Solid   GO   Solid: Within functional limits        Houston Siren 02/20/2018,8:31 AM  Lattie Haw  Jannifer Franklin [employer] [phone]

## 2018-02-20 NOTE — Consult Note (Addendum)
[facility] NOTE   MRN : [id number]  Reason for Consult: ESRD has [facility] needs access Referring Physician: Dr. Florene Glen  History of Present Illness: 77 y/o male with CKD stage V.  He has had 2 previous access attempts that failed UE right and left.  We have been asked to provide him with future access.    He was brought to the hospital with symptoms of SOB.  He arrived and was pulseless.  He was coded for 10 min.  CXR revealed pulmonary edema and IR placed tunnel catheter to initiate HD.   Planned heart caht today.     Past medical history includes: HIV, prostate CA, heart failure, HTN.  CKD stage V He denise DM.   Current Facility-Administered Medications  Medication Dose Route Frequency Provider Last Rate Last Dose  . 0.9 %  sodium chloride infusion  250 mL Intravenous PRN Omar Person, NP   Stopped at 02/19/18 1100  . 0.9 %  sodium chloride infusion  100 mL Intravenous PRN Estanislado Emms, MD      . 0.9 %  sodium chloride infusion  100 mL Intravenous PRN Estanislado Emms, MD      . alteplase (CATHFLO ACTIVASE) injection 2 mg  2 mg Intracatheter Once PRN Estanislado Emms, MD      . calcium acetate (PHOSLO) capsule 667 mg  667 mg Oral TID WC Estanislado Emms, MD      . carvedilol (COREG) tablet 6.25 mg  6.25 mg Oral BID WC Simonne Maffucci B, MD   6.25 mg at 02/20/18 0840  . Chlorhexidine Gluconate Cloth 2 % PADS 6 each  6 each Topical Daily Simonne Maffucci B, MD      . dolutegravir (TIVICAY) tablet 50 mg  50 mg Oral Daily Simonne Maffucci B, MD   50 mg at 02/20/18 0840  . emtricitabine-tenofovir AF (DESCOVY) 200-25 MG per tablet 1 tablet  1 tablet Oral Daily Juanito Doom, MD   1 tablet at 02/20/18 0841  . heparin injection 1,000 Units  1,000 Units Dialysis PRN Estanislado Emms, MD      . heparin injection 1,900 Units  20 Units/kg Dialysis PRN Estanislado Emms, MD      . heparin injection 5,000 Units  5,000 Units Subcutaneous Q8H Carnella [place], [place]   5,000 Units at 02/20/18 8588  . lidocaine (PF) (XYLOCAINE) 1 % injection 5 mL  5 mL Intradermal PRN Estanislado Emms, MD      . lidocaine-prilocaine (EMLA) cream 1 application  1 application Topical PRN Estanislado Emms, MD      . MEDLINE mouth rinse  15 mL Mouth Rinse BID Simonne Maffucci B, MD   15 mL at 02/20/18 0849  . midazolam (VERSED) injection 1 mg  1 mg Intravenous Q15 min PRN Omar Person, NP      . midazolam (VERSED) injection 1 mg  1 mg Intravenous Q2H PRN Omar Person, NP      . mupirocin ointment (BACTROBAN) 2 %   Nasal BID Simonne Maffucci B, MD      . pantoprazole (PROTONIX) injection 40 mg  40 mg Intravenous QHS Omar Person, NP   40 mg at 02/19/18 2128  . pentafluoroprop-tetrafluoroeth (GEBAUERS) aerosol 1 application  1 application Topical PRN Estanislado Emms, MD      . scopolamine (TRANSDERM-SCOP) 1 MG/3DAYS 1.5 mg  1 patch Transdermal Q72H Juanito Doom, MD  1.5 mg at 02/17/18 1737  . sodium chloride flush (NS) 0.9 % injection 10-40 mL  10-40 mL Intracatheter Q12H Simonne Maffucci B, MD   10 mL at 02/20/18 1012  . sodium chloride flush (NS) 0.9 % injection 10-40 mL  10-40 mL Intracatheter PRN Juanito Doom, MD        Pt meds include: Statin :No Betablocker: Yes ASA: Yes Other anticoagulants/antiplatelets: none  Past Medical History:  Diagnosis Date  . Acute on chronic systolic and diastolic heart failure, NYHA class 4 ([place])   . CKD (chronic kidney disease) stage V requiring chronic dialysis ([place])   . HIV (human immunodeficiency virus infection) ([place])   . Prostate cancer [facility])     Past Surgical History:  Procedure Laterality Date  . IR FLUORO GUIDE CV LINE RIGHT  02/18/2018  . IR US GUIDE VASC ACCESS RIGHT  02/18/2018    Social History Social History   Tobacco Use  . Smoking status: Not on file  Substance Use Topics  . Alcohol use: Not on file  . Drug use: Not on file    Family History History reviewed. No pertinent  family history.  Unknown  No Known Allergies   REVIEW OF SYSTEMS  General: [ ]  Weight loss, [ ]  Fever, [ ]  chills Neurologic: [ ]  Dizziness, [ ]  Blackouts, [ ]  Seizure [ ]  Stroke, [ ]  "Mini stroke", [ ]  Slurred speech, [ ]  Temporary blindness; [ ]  weakness in arms or legs, [ ]  Hoarseness [ ]  Dysphagia Cardiac: [ ]  Chest pain/pressure, [x ] Shortness of breath at rest [ x] Shortness of breath with exertion, [ ]  Atrial fibrillation or irregular heartbeat  Vascular: [ ]  Pain in legs with walking, [ ]  Pain in legs at rest, [ ]  Pain in legs at night,  [ ]  Non-healing ulcer, [ ]  Blood clot in vein/DVT,   Pulmonary: [ ]  Home oxygen, [ ]  Productive cough, [ ]  Coughing up blood, [ ]  Asthma,  [ ]  Wheezing [ ]  COPD Musculoskeletal:  [ ]  Arthritis, [ ]  Low back pain, [ ]  Joint pain Hematologic: [ ]  Easy Bruising, [ ]  Anemia; [ ]  Hepatitis Gastrointestinal: [ ]  Blood in stool, [ ]  Gastroesophageal Reflux/heartburn, Urinary: [x ] chronic Kidney disease, [ ]  on HD - [ ]  MWF or [ ]  TTHS, [ ]  Burning with urination, [ ]  Difficulty urinating Skin: [ ]  Rashes, [ ]  Wounds Psychological: [ ]  Anxiety, [ ]  Depression  Physical Examination Vitals:   02/20/18 0417 02/20/18 0500 02/20/18 0600 02/20/18 0746  BP:  (!) 121/93 136/80 128/74  Pulse:  82 87   Resp:  18 (!) 21   Temp: 99.4 F (37.4 C)   98.6 F (37 C)  TempSrc: Oral   Oral  SpO2:  100% 100%   Weight:  195 lb 8.8 oz (88.7 kg)    Height:       Body mass index is 25.11 kg/m.  General:  WDWN in NAD HENT: WNL Eyes: Pupils equal Pulmonary: normal non-labored breathing , without Rales, rhonchi,  wheezing Cardiac: RRR, without  Murmurs, rubs or gallops; No carotid bruits Abdomen: soft, NT, no masses Skin: no rashes, ulcers noted;  no Gangrene , no cellulitis; no open wounds;   Vascular Exam/Pulses:Palpable radial pulses B   Musculoskeletal: no muscle wasting or atrophy; Neurologic: A&O X 3; Appropriate Affect ;  SENSATION:  normal; MOTOR FUNCTION: grossly 5/5 Symmetric Speech is fluent/normal   Significant Diagnostic Studies: CBC Lab Results  Component  Value Date   WBC 10.8 (H) 02/20/2018   HGB 10.7 (L) 02/20/2018   HCT 33.1 (L) 02/20/2018   MCV 100.3 (H) 02/20/2018   PLT 186 02/20/2018    BMET    Component Value Date/Time   NA 139 02/20/2018 0305   K 3.4 (L) 02/20/2018 0305   CL 101 02/20/2018 0305   CO2 24 02/20/2018 0305   GLUCOSE 116 (H) 02/20/2018 0305   BUN 78 (H) 02/20/2018 0305   CREATININE 5.06 (H) 02/20/2018 0305   CALCIUM 8.9 02/20/2018 0305   GFRNONAA 10 (L) 02/20/2018 0305   GFRAA 12 (L) 02/20/2018 0305   Estimated Creatinine Clearance: 14.7 mL/min (A) (by C-G formula based on SCr of 5.06 mg/dL (H)).  COAG Lab Results  Component Value Date   INR 0.98 02/17/2018     Non-Invasive Vascular Imaging: pending vein mapping  ASSESSMENT/PLAN:  He has Bilateral UE access that has failed in the past.  I'm not sure if they were cephalic verses basilic.  I have ordered vein mapping.  He currently has a right TDC and has HD time 1 since his admission.  Plan for UE access will be discussed after vein mapping is complete.     Roxy Horseman 02/20/2018 12:05 PM   History and exam details as above.  Pt has 2+ brachial and radial pulse bilaterally.  He is right handed.  He has scars consistent with prior left basilic vein transposition and right brachial cephalic.  Will give him a few days to recover and get vein map but most likely will need left arm AV graft early next week  Ruta Hinds, MD [facility] [place] Office: [phone] Pager: [phone]

## 2018-02-20 NOTE — Progress Notes (Signed)
PULMONARY / CRITICAL CARE MEDICINE   Name: David [name] MRN: [id number] DOB: 17-Aug-1941    ADMISSION DATE:  02/17/2018 CONSULTATION DATE:  02/17/2018  REFERRING MD:  Dr. 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 Wife reports that patient has had a dry cough last several days however, other than that has been in usual state on health. This morning around 2-3 am patient woke up with severe shortness of breath asking to be taken to ED. Upon arrival to ED patient was noted to be agonal and pulseless in parking lot. CPR started and continued for estimated 5-10 minutes before return of ROSC. Intubated. CXR consistent with pulmonary edema. Creatinine 5.32, LA 4.44. PCCM asked to admit.   SUBJECTIVE:  No events overnight Tolerated   VITAL SIGNS: BP 128/74   Pulse 87   Temp 98.6 F (37 C) (Oral)   Resp (!) 21   Ht 6\' 2"  (1.88 m)   Wt 195 lb 8.8 oz (88.7 kg)   SpO2 100%   BMI 25.11 kg/m   HEMODYNAMICS:    VENTILATOR SETTINGS: Vent Mode: CPAP;PSV FiO2 (%):  [40 %] 40 % PEEP:  [5 cmH20] 5 cmH20 Pressure Support:  [8 cmH20] 8 cmH20  INTAKE / OUTPUT: I/O last 3 completed shifts: In: 1677.1 [I.V.:433.1; NG/GT:980; IV Piggyback:264] Out: 8466 [Urine:2530; Other:1000]  PHYSICAL EXAMINATION: General:  Adult male, sitting in chair, NAD Neuro:  Alert and interactive, moving all ext to command HEENT:  [place]/AT, PERRL, EOM-I and MMM Cardiovascular:  RRR, Nl S1/S2 and -M/R/G Lungs:  CTA bilaterally Abdomen:  Soft, NT, ND and +BS Musculoskeletal: 1+ edema and -tenderness Skin:  Warm, dry   LABS:  BMET Recent Labs  Lab 02/18/18 1840 02/19/18 0252 02/20/18 0305  NA 139 139 139  K 3.3* 3.4* 3.4*  CL 104 101  101  CO2 24 24 24   BUN 102* 71* 78*  CREATININE 5.60* 4.34* 5.06*  GLUCOSE 115* 116* 116*   Electrolytes Recent Labs  Lab 02/18/18 1840 02/19/18 0252 02/20/18 0305  CALCIUM 8.5* 8.6* 8.9  MG 2.3 2.2 2.2  PHOS 3.9  3.8 3.7 6.0*   CBC Recent Labs  Lab 02/18/18 2019 02/19/18 0252 02/20/18 0305  WBC 11.6* 10.7* 10.8*  HGB 10.3* 11.1* 10.7*  HCT 32.8* 33.6* 33.1*  PLT 183 189 186   Coag's Recent Labs  Lab 02/17/18 0300  INR 0.98   Sepsis Markers Recent Labs  Lab 02/17/18 0311 02/17/18 0427 02/17/18 1020 02/18/18 0558 02/19/18 0252  LATICACIDVEN 4.44* 2.2* 1.2  --   --   PROCALCITON  --  0.13  --  18.88 17.98   ABG Recent Labs  Lab 02/17/18 0413 02/19/18 0450  PHART 7.286* 7.531*  PCO2ART 44.9 27.8*  PO2ART 103.0 156*   Liver Enzymes Recent Labs  Lab 02/17/18 0300 02/18/18 1840  AST 27  --   ALT 22  --   ALKPHOS 117  --   BILITOT 0.6  --   ALBUMIN 4.1 3.1*   Cardiac Enzymes Recent Labs  Lab 02/17/18 0455 02/17/18 1020 02/17/18 1633  TROPONINI 0.08* 0.07* 0.07*   Glucose Recent Labs  Lab 02/19/18 1149 02/19/18 1556 02/19/18 2012 02/19/18 2352 02/20/18 0341 02/20/18 0750  GLUCAP 90 113* 121* 115*  118* 114*   Imaging No results found. STUDIES:  CXR 3/17 > 1. Endotracheal tube 7.4 cm from the carina. Enteric tube in place with tip visualized in the stomach. 2. Cardiomegaly. Bilateral ill-defined perihilar opacities, favoring pulmonary edema. Multifocal pneumonia or aspiration could have a similar appearance. CT Chest 3/17 >> CT Head 3/17 >> ECHO 3/17 >>   CULTURES: Blood 3/17 >>NTD Sputum 3/17 >>NTD U/A 3/17 >>NTD  ANTIBIOTICS: None   SIGNIFICANT EVENTS: 3/17 > Presents to ED   LINES/TUBES: ETT 3/17 >>3/19  I reviewed CXR myself, no acute disease noted  DISCUSSION: 77 y.o.malewith severe and sudden shortness of breath presents to ED on 3/17 agonal and pulseless for 5-10 minutes before return of ROSC. Intubated.  PCCM asked to admit.  Discussed with TRH-MD and PCCM-NP  ASSESSMENT / PLAN:  PULMONARY A: Acute Hypoxic Respiratory Failure in setting of suspected decompensated heart failure, +/-viral illness  CXR with cardiomegaly and perihilar opacities concerning for pulmonary edema  P:   Pulmonary Hygiene  RVP all negative Titrate O2 for sat of 88-92% Ambulate Flutter valve  CARDIOVASCULAR A:  PEA/Aystole Arrest 5-10 minutes  Decompensated Heart Failure  Nonischemic cardiomyopathy with EF 46-50% and diastolic dysfunction H/O HTN, HLD P:  Cardiac Monitoring  Maintain MAP >65  ECHO per cards, defer cardiac catherization to cardiology  RENAL A:   Acute on Chronic Kidney Disease (Baseline Crt 4.6-4.9)  Crt 5.32 >  -Followed by Dr. Posey Pronto in office, plans for AVF placement  -Given 100 meq Lasix in ED  Anion Gap Metabolic Acidosis, Lactic Acidosis suspected secondary to hypoperfusion  P:   Trend BMP  Replace electrolytes as indicated  HD per renal Appreciate input form renal  GASTROINTESTINAL A:   SUP  P:   D/C TF Diet as ordered PPI   HEMATOLOGIC A:   Microcytic Anemia  HIV/AIDS Prostate Cancer s/p brachytherapy P:  Trend CBC  Maintain Hbg >7   INFECTIOUS A:   Afebrile, WBC 10.9  P:   Trend WBC and Fever Curve PAN Culture  Monitor off antibiotics, no overt evidence of infection Anti-retroviral drugs per ID  ENDOCRINE A:   Hyperglycemia    P:   Trend Glucose  SSI   NEUROLOGIC A:   Sedation needs -Tylenol and Salicylate level negative  H/O CVA P:   RASS goal: N/A D/C fentanyl D/C propofol D/C versed CT Head negative  FAMILY  - Updates: Patient updated bedside  - Inter-disciplinary family meet or Palliative Care meeting due by:  02/24/2018   Transfer to SDU and to [facility] service with PCCM off 3/21  Rush Farmer, M.D. [facility] Pulmonary/Critical Care Medicine. Pager: [phone]. After hours pager: [phone].

## 2018-02-20 NOTE — Evaluation (Signed)
Physical Therapy Evaluation Patient Details Name: David [name] MRN: [id number] DOB: [birth year] Today's Date: 02/20/2018   History of Present Illness  Pt is a 77 y.o. male admitted 02/17/18 with severe SOB and PEA arrest upon arrival requiring CPR before return to [place]; intubated 3/17-3/19. CXR consistent with pulmonary edema. CXR 3/18 shows bilateral pnuemonia. R IJ tunneled HD catheter placed 3/18 to initiate HD. Planned heart cath 3/20. 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     Clinical Impression  Pt presents with an overall decrease in functional mobility secondary to above. PTA, pt mod indep with SPC; lives with family available for 24/7 support. Today, pt able to amb 300' with RW and intermittent min guard for balance; required intermittent cues for maintaining safe proximity to RW throughout. Expect pt to progress well with mobility and not require follow-up services. Pt would benefit from continued acute PT services to maximize functional mobility and independence prior to d/c home.     Follow Up Recommendations No PT follow up;Supervision/Assistance - 24 hour    Equipment Recommendations  None recommended by PT    Recommendations for Other Services       Precautions / Restrictions Precautions Precautions: Fall Precaution Comments: R IJ tunnelled cath Restrictions Weight Bearing Restrictions: No      Mobility  Bed Mobility Overal bed mobility: Modified Independent             General bed mobility comments: Increased time and effort, but no physical assist required  Transfers Overall transfer level: Needs assistance Equipment used: Rolling walker (2 wheeled) Transfers: Sit to/from Stand Sit to Stand: Min guard;Supervision         General transfer comment: Initial min guard for balance upon standing from recliner, with cues for correct hand placement. Supervision when  sitting onto bed  Ambulation/Gait Ambulation/Gait assistance: Min guard Ambulation Distance (Feet): 300 Feet Assistive device: Rolling walker (2 wheeled) Gait Pattern/deviations: Step-through pattern;Decreased stride length;Decreased dorsiflexion - right;Antalgic Gait velocity: Decreased Gait velocity interpretation: <1.8 ft/sec, indicative of risk for recurrent falls General Gait Details: Slow, controlled amb with RW and intermittent min guard for balance. Intermittent cues to maintain proximity of RW, especially with turns, and to maintain upright posture  Stairs            Wheelchair Mobility    Modified Rankin (Stroke Patients Only)       Balance Overall balance assessment: Needs assistance   Sitting balance-Leahy Scale: Good Sitting balance - Comments: Able to fix socks sitting EOB     Standing balance-Leahy Scale: Poor Standing balance comment: Reliant on UE support                             Pertinent Vitals/Pain Pain Assessment: Faces Faces Pain Scale: Hurts little more Pain Location: R knee (h/o gout) Pain Descriptors / Indicators: Sore Pain Intervention(s): Monitored during session;Limited activity within patient's tolerance    Home Living Family/patient expects to be discharged to:: Private residence Living Arrangements: Spouse/significant other;Children Available Help at Discharge: Family;Available 24 hours/day Type of Home: House Home Access: Stairs to enter Entrance Stairs-Rails: Right Entrance Stairs-Number of Steps: 3 Home Layout: One level Home Equipment: Walker - 2 wheels;Cane - single point      Prior Function Level of Independence: Independent with assistive device(s)         Comments: Mod indep with SPC. Drives. 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)     Hand Dominance        Extremity/Trunk Assessment   Upper Extremity Assessment Upper Extremity Assessment: Overall WFL for tasks assessed    Lower  Extremity Assessment Lower Extremity Assessment: Overall WFL for tasks assessed;RLE deficits/detail LLE Deficits / Details: h/o R knee gout pain       Communication   Communication: No difficulties  Cognition Arousal/Alertness: Awake/alert Behavior During Therapy: Flat affect Overall Cognitive Status: Within Functional Limits for tasks assessed                                 General Comments: Slight delay in processing (?), but overall [facility]      General Comments      Exercises     Assessment/Plan    PT Assessment Patient needs continued PT services  PT Problem List Decreased strength;Decreased activity tolerance;Decreased balance;Decreased mobility;Decreased knowledge of use of DME       PT Treatment Interventions DME instruction;Gait training;Stair training;Functional mobility training;Therapeutic activities;Therapeutic exercise;Balance training;Patient/family education    PT Goals (Current goals can be found in the Care Plan section)  Acute Rehab PT Goals Patient Stated Goal: Return home PT Goal Formulation: With patient Time For Goal Achievement: 03/06/18 Potential to Achieve Goals: Good    Frequency Min 3X/week   Barriers to discharge        Co-evaluation               AM-PAC PT "6 Clicks" Daily Activity  Outcome Measure Difficulty turning over in bed (including adjusting bedclothes, sheets and blankets)?: None Difficulty moving from lying on back to sitting on the side of the bed? : A Little Difficulty sitting down on and standing up from a chair with arms (e.g., wheelchair, bedside commode, etc,.)?: A Little Help needed moving to and from a bed to chair (including a wheelchair)?: A Little Help needed walking in hospital room?: A Little Help needed climbing 3-5 steps with a railing? : A Little 6 Click Score: 19    End of Session Equipment Utilized During Treatment: Gait belt Activity Tolerance: Patient tolerated treatment  well Patient left: in bed;with call bell/phone within reach;with bed alarm set Nurse Communication: Mobility status PT Visit Diagnosis: Other abnormalities of gait and mobility (R26.89)    Time: 7654-6503 PT Time Calculation (min) (ACUTE ONLY): 25 min   Charges:   PT Evaluation $PT Eval Moderate Complexity: 1 Mod PT Treatments $Gait Training: 8-22 mins   PT G Codes:       Mabeline Caras, PT, DPT Acute Rehab Services  Pager: [phone] 02/20/2018, 3:35 PM

## 2018-02-21 ENCOUNTER — Other Ambulatory Visit: Payer: Self-pay

## 2018-02-21 ENCOUNTER — Encounter (HOSPITAL_COMMUNITY): Payer: Self-pay | Admitting: Interventional Cardiology

## 2018-02-21 ENCOUNTER — Inpatient Hospital Stay (HOSPITAL_COMMUNITY): Payer: Medicare HMO

## 2018-02-21 DIAGNOSIS — D649 Anemia, unspecified: Secondary | ICD-10-CM

## 2018-02-21 DIAGNOSIS — I428 Other cardiomyopathies: Secondary | ICD-10-CM

## 2018-02-21 LAB — CBC
HCT: 33.7 % — ABNORMAL LOW (ref 39.0–52.0)
Hemoglobin: 10.8 g/dL — ABNORMAL LOW (ref 13.0–17.0)
MCH: 32.2 pg (ref 26.0–34.0)
MCHC: 32 g/dL (ref 30.0–36.0)
MCV: 100.6 fL — AB (ref 78.0–100.0)
PLATELETS: 192 10*3/uL (ref 150–400)
RBC: 3.35 MIL/uL — ABNORMAL LOW (ref 4.22–5.81)
RDW: 13.9 % (ref 11.5–15.5)
WBC: 8.9 10*3/uL (ref 4.0–10.5)

## 2018-02-21 LAB — GLUCOSE, CAPILLARY
GLUCOSE-CAPILLARY: 178 mg/dL — AB (ref 65–99)
GLUCOSE-CAPILLARY: 122 mg/dL — AB (ref 65–99)
Glucose-Capillary: 74 mg/dL (ref 65–99)
Glucose-Capillary: 121 mg/dL — ABNORMAL HIGH (ref 65–99)

## 2018-02-21 LAB — BASIC METABOLIC PANEL
Anion gap: 13 (ref 5–15)
BUN: 89 mg/dL — AB (ref 6–20)
CALCIUM: 9.2 mg/dL (ref 8.9–10.3)
CO2: 26 mmol/L (ref 22–32)
CREATININE: 5.05 mg/dL — AB (ref 0.61–1.24)
Chloride: 103 mmol/L (ref 101–111)
GFR calc Af Amer: 12 mL/min — ABNORMAL LOW (ref >60)
GFR, EST NON AFRICAN AMERICAN: 10 mL/min — AB (ref >60)
GLUCOSE: 128 mg/dL — AB (ref 65–99)
Potassium: 3.5 mmol/L (ref 3.5–5.1)
Sodium: 142 mmol/L (ref 135–145)

## 2018-02-21 LAB — MAGNESIUM: Magnesium: 2.4 mg/dL (ref 1.7–2.4)

## 2018-02-21 LAB — ALBUMIN: ALBUMIN: 3.2 g/dL — AB (ref 3.5–5.0)

## 2018-02-21 LAB — PHOSPHORUS: Phosphorus: 4.9 mg/dL — ABNORMAL HIGH (ref 2.5–4.6)

## 2018-02-21 MED ORDER — HEPARIN SODIUM (PORCINE) 1000 UNIT/ML DIALYSIS
20.0000 [IU]/kg | INTRAMUSCULAR | Status: DC | PRN
  Filled 2018-02-21: qty 2

## 2018-02-21 MED ORDER — SODIUM CHLORIDE 0.9% FLUSH
3.0000 mL | Freq: Two times a day (BID) | INTRAVENOUS | Status: DC
  Administered 2018-02-21 – 2018-03-02 (×15): 3 mL via INTRAVENOUS

## 2018-02-21 MED ORDER — LIDOCAINE HCL (PF) 1 % IJ SOLN: 5.0000 mL | INTRAMUSCULAR | Status: DC | PRN

## 2018-02-21 MED ORDER — SODIUM CHLORIDE 0.9 % IV SOLN: 100.0000 mL | INTRAVENOUS | Status: DC | PRN

## 2018-02-21 MED ORDER — ACETAMINOPHEN 325 MG PO TABS
650.0000 mg | ORAL_TABLET | ORAL | Status: DC | PRN
  Administered 2018-02-27 – 2018-03-03 (×2): 650 mg via ORAL
  Filled 2018-02-21 (×2): qty 2

## 2018-02-21 MED ORDER — SODIUM CHLORIDE 0.9 % IV SOLN: INTRAVENOUS | Status: DC

## 2018-02-21 MED ORDER — ONDANSETRON HCL 4 MG/2ML IJ SOLN
4.0000 mg | Freq: Four times a day (QID) | INTRAMUSCULAR | Status: DC | PRN
  Administered 2018-02-21 – 2018-03-03 (×5): 4 mg via INTRAVENOUS
  Filled 2018-02-21 (×7): qty 2

## 2018-02-21 MED ORDER — SODIUM CHLORIDE 0.9 % IV SOLN: 250.0000 mL | INTRAVENOUS | Status: DC | PRN

## 2018-02-21 MED ORDER — SODIUM CHLORIDE 0.9% FLUSH: 3.0000 mL | Freq: Two times a day (BID) | INTRAVENOUS | Status: DC

## 2018-02-21 MED ORDER — ALTEPLASE 2 MG IJ SOLR: 2.0000 mg | Freq: Once | INTRAMUSCULAR | Status: DC | PRN

## 2018-02-21 MED ORDER — PENTAFLUOROPROP-TETRAFLUOROETH EX AERO: 1.0000 "application " | INHALATION_SPRAY | CUTANEOUS | Status: DC | PRN

## 2018-02-21 MED ORDER — ASPIRIN 81 MG PO CHEW: 81.0000 mg | CHEWABLE_TABLET | ORAL | Status: DC

## 2018-02-21 MED ORDER — HEPARIN SODIUM (PORCINE) 1000 UNIT/ML DIALYSIS
1000.0000 [IU] | INTRAMUSCULAR | Status: DC | PRN
  Filled 2018-02-21: qty 1

## 2018-02-21 MED ORDER — SODIUM CHLORIDE 0.9% FLUSH: 3.0000 mL | INTRAVENOUS | Status: DC | PRN

## 2018-02-21 MED ORDER — LIDOCAINE-PRILOCAINE 2.5-2.5 % EX CREA
1.0000 "application " | TOPICAL_CREAM | CUTANEOUS | Status: DC | PRN
  Filled 2018-02-21: qty 5

## 2018-02-21 MED FILL — Heparin Sodium (Porcine) 2 Unit/ML in Sodium Chloride 0.9%: INTRAMUSCULAR | Qty: 1000 | Status: AC

## 2018-02-21 NOTE — Progress Notes (Signed)
Assessment/Plan:77 year old BM with controlled HIV, EF 20-25 % NICM, as well as stage 5 CKD. This is third hosp in 3 mos for pulmonary edema/pulmonary issues   1.New ESRD, s/p [facility] 3/18 VVS has seen and plans for possible AVVG next week.  CLiP pending 2. Hypertension/volume- BPs better 3 VDRF s/p extubation  4.Bones-PTH133 5. Hypokalemia 6. NICM EF 20-24%  Tol HD treatment. No hemodynamic issues. TDC functioning well.  Darrold Span Asami Lambright,MD

## 2018-02-21 NOTE — Progress Notes (Addendum)
Pt transported off unit to dialysis with RN. VSS. Dr Florene Glen aware and okay with treatment off unit.

## 2018-02-21 NOTE — Progress Notes (Addendum)
PROGRESS NOTE    David [name]  RJJ:[id number] DOB: 1941-03-05 DOA: 02/17/2018 PCP: No primary care provider on file.   Brief Narrative:  77 year old BM PMHx of HIV/AIDS, Prostate Ca, S/P Brachytherapy, CKD stage V not on dialysis, Nonischemic Cardiomyopathy with EF 01-60% and Diastolic dysfunction, and HTN   Presents to ED 3/17. Wife reports that patient has had a dry cough last several days however, other than that has been in usual state on health. This morning around 2-3 am patient woke up with severe shortness of breath asking to be taken to ED. Upon arrival to ED patient was noted to be agonal and pulseless in parking lot. CPR started and continued for estimated 5-10 minutes before return of ROSC. Intubated. CXR consistent with pulmonary edema. Creatinine 5.32, LA 4.44. PCCM asked to admit.     Subjective: 3/21/O x4, negative CP, negative S OB, negative abdominal pain.  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   Assessment & Plan:   Principal Problem:   HIV (human immunodeficiency virus infection) ([place]) Active Problems:   Respiratory arrest ([place])   Acute pulmonary edema (HCC)   Acute on chronic systolic heart failure (HCC)   Chronic obstructive pulmonary disease (HCC)   Cardiac arrest (HCC)   Acute on chronic systolic and diastolic heart failure, NYHA class 4 (HCC)   ESRD (end stage renal disease) ([place])   Acute respiratory failure with hypoxemia (HCC)  Acute respiratory failure with hypoxia -Most likely secondary to PEA, decompensated CHF. -Titrate O2 to maintain SPO2> 93% -Flutter valve -Ambulate every shift   HIV/AIDS -Continue antiretrovirals per ID -CD4 count pending/HIV RNA quant pending   PEA/Asystole Arrest ROSC 5-10 minutes/Nonischemic Cardiomyopathy/Chronic Diastolic CHF -CXR: Cardiomegaly perihilar opacities, pulmonary edema?    New ESRD -HD per nephrology -Vein mapping and repair/new AV fistula per VVS  Anemia unspecified  type -Anemia panel pending  Prostate Cancer s/p brachytherapy       DVT prophylaxis: Subcu heparin Code Status: Full Family Communication: Wife at bedside for discussion of plan of care Disposition Plan: TBD   Consultants:  Cardiology Nephrology    Procedures/Significant Events:  No results found. STUDIES:  CXR 3/17 > 1. Endotracheal tube 7.4 cm from the carina. Enteric tube in place with tip visualized in the stomach. 2. Cardiomegaly. Bilateral ill-defined perihilar opacities, favoring pulmonary edema. Multifocal pneumonia or aspiration could have a similar appearance. CT Chest 3/17 >> CT Head 3/17 >> ECHO 3/17 >>  ETT 3/17 >>3/19     I have personally reviewed and interpreted all radiology studies and my findings are as above.  VENTILATOR SETTINGS:    Cultures 3/17 blood NGTD 3/17 MRSA by PCR positive 3/17 respiratory virus panel negative 3/18 hepatitis B negative     Antimicrobials: Anti-infectives (From admission, onward)   Start     Stop   02/18/18 1330  ceFAZolin (ANCEF) IVPB 2g/100 mL premix     02/18/18 1409   02/17/18 1200  dolutegravir (TIVICAY) tablet 50 mg         02/17/18 1200  emtricitabine-tenofovir AF (DESCOVY) 200-25 MG per tablet 1 tablet             Devices    LINES / TUBES:  Right IJ tunneled HD cath>>>    Continuous Infusions: . sodium chloride Stopped (02/19/18 1100)  . sodium chloride    . sodium chloride    . sodium chloride    . [START ON 02/22/2018] sodium chloride    .  sodium chloride       Objective: Vitals:   02/21/18 0329 02/21/18 0400 02/21/18 0500 02/21/18 0600  BP:  127/68 (!) 144/78 (!) 145/78  Pulse:  83 79 79  Resp:      Temp: 98.2 F (36.8 C)     TempSrc: Oral     SpO2:  98% 95% 97%  Weight:   194 lb 7.1 oz (88.2 kg)   Height:        Intake/Output Summary (Last 24 hours) at 02/21/2018 1751 Last data filed at 02/21/2018 0600 Gross per 24 hour  Intake 10 ml  Output 1301 ml  Net -1291 ml    Filed Weights   02/19/18 0238 02/20/18 0500 02/21/18 0500  Weight: 198 lb 6.6 oz (90 kg) 195 lb 8.8 oz (88.7 kg) 194 lb 7.1 oz (88.2 kg)    Examination:  General: A/O x4, No acute respiratory distress Neck:  Negative scars, masses, torticollis, lymphadenopathy, JVD Lungs: Clear to auscultation bilaterally without wheezes or crackles Cardiovascular: Regular rate and rhythm without murmur gallop or rub normal S1 and S2 Abdomen: negative abdominal pain, nondistended, positive soft, bowel sounds, no rebound, no ascites, no appreciable mass Extremities: No significant cyanosis, clubbing, or edema bilateral lower extremities Skin: Negative rashes, lesions, ulcers Psychiatric:  Negative depression, negative anxiety, negative fatigue, negative mania  Central nervous system:  Cranial nerves II through XII intact, tongue/uvula midline, all extremities muscle strength 5/5, sensation intact throughout, negative dysarthria, negative expressive aphasia, negative receptive aphasia.  .     Data Reviewed: Care during the described time interval was provided by me .  I have reviewed this patient's available data, including medical history, events of note, physical examination, and all test results as part of my evaluation.   CBC: Recent Labs  Lab 02/17/18 0300  02/17/18 1020 02/18/18 0558 02/18/18 2019 02/19/18 0252 02/20/18 0305 02/21/18 0329  WBC 10.9*  --  9.7 10.8* 11.6* 10.7* 10.8* 8.9  NEUTROABS 3.5  --  8.5*  --   --   --   --   --   HGB 13.5   < > 11.4* 10.1* 10.3* 11.1* 10.7* 10.8*  HCT 41.5   < > 35.8* 31.0* 32.8* 33.6* 33.1* 33.7*  MCV 103.0*  --  100.0 99.4 98.8 100.6* 100.3* 100.6*  PLT 256  --  234 186 183 189 186 192   < > = values in this interval not displayed.   Basic Metabolic Panel: Recent Labs  Lab 02/18/18 0558 02/18/18 1840 02/19/18 0252 02/20/18 0305 02/21/18 0329  NA 136 139 139 139 142  K 3.1* 3.3* 3.4* 3.4* 3.5  CL 102 104 101 101 103  CO2 20* 24 24 24  26   GLUCOSE 147* 115* 116* 116* 128*  BUN 96* 102* 71* 78* 89*  CREATININE 5.46* 5.60* 4.34* 5.06* 5.05*  CALCIUM 8.2* 8.5* 8.6* 8.9 9.2  MG 2.1 2.3 2.2 2.2 2.4  PHOS 3.8 3.9  3.8 3.7 6.0* 4.9*   GFR: Estimated Creatinine Clearance: 14.7 mL/min (A) (by C-G formula based on SCr of 5.05 mg/dL (H)). Liver Function Tests: Recent Labs  Lab 02/17/18 0300 02/18/18 1840  AST 27  --   ALT 22  --   ALKPHOS 117  --   BILITOT 0.6  --   PROT 7.9  --   ALBUMIN 4.1 3.1*   No results for input(s): LIPASE, AMYLASE in the last 168 hours. No results for input(s): AMMONIA in the last 168 hours. Coagulation Profile: Recent  Labs  Lab 02/17/18 0300  INR 0.98   Cardiac Enzymes: Recent Labs  Lab 02/17/18 0455 02/17/18 1020 02/17/18 1633  TROPONINI 0.08* 0.07* 0.07*   BNP (last 3 results) No results for input(s): PROBNP in the last 8760 hours. HbA1C: No results for input(s): HGBA1C in the last 72 hours. CBG: Recent Labs  Lab 02/20/18 1212 02/20/18 1608 02/20/18 2001 02/20/18 2344 02/21/18 0327  GLUCAP 116* 125* 230* 207* 178*   Lipid Profile: Recent Labs    02/20/18 0732  TRIG 112   Thyroid Function Tests: No results for input(s): TSH, T4TOTAL, FREET4, T3FREE, THYROIDAB in the last 72 hours. Anemia Panel: No results for input(s): VITAMINB12, FOLATE, FERRITIN, TIBC, IRON, RETICCTPCT in the last 72 hours. Urine analysis:    Component Value Date/Time   COLORURINE YELLOW 02/17/2018 0540   APPEARANCEUR CLOUDY (A) 02/17/2018 0540   LABSPEC 1.010 02/17/2018 0540   PHURINE 5.0 02/17/2018 0540   GLUCOSEU NEGATIVE 02/17/2018 0540   HGBUR LARGE (A) 02/17/2018 0540   BILIRUBINUR NEGATIVE 02/17/2018 0540   KETONESUR NEGATIVE 02/17/2018 0540   PROTEINUR 100 (A) 02/17/2018 0540   NITRITE NEGATIVE 02/17/2018 0540   LEUKOCYTESUR NEGATIVE 02/17/2018 0540   Sepsis Labs: @LABRCNTIP (procalcitonin:4,lacticidven:4)  ) Recent Results (from the past 240 hour(s))  Culture, blood  (routine x 2)     Status: None (Preliminary result)   Collection Time: 02/17/18  3:07 AM  Result Value Ref Range Status   Specimen Description BLOOD RIGHT HAND  Final   Special Requests IN PEDIATRIC BOTTLE Blood Culture adequate volume  Final   Culture   Final    NO GROWTH 3 DAYS Performed at [facility], [place] [address] [place], [place] [address]    Report Status PENDING  Incomplete  Respiratory Panel by PCR     Status: None   Collection Time: 02/17/18  5:40 AM  Result Value Ref Range Status   Adenovirus NOT DETECTED NOT DETECTED Final   Coronavirus 229E NOT DETECTED NOT DETECTED Final   Coronavirus HKU1 NOT DETECTED NOT DETECTED Final   Coronavirus NL63 NOT DETECTED NOT DETECTED Final   Coronavirus OC43 NOT DETECTED NOT DETECTED Final   Metapneumovirus NOT DETECTED NOT DETECTED Final   Rhinovirus / Enterovirus NOT DETECTED NOT DETECTED Final   Influenza A NOT DETECTED NOT DETECTED Final   Influenza B NOT DETECTED NOT DETECTED Final   Parainfluenza Virus 1 NOT DETECTED NOT DETECTED Final   Parainfluenza Virus 2 NOT DETECTED NOT DETECTED Final   Parainfluenza Virus 3 NOT DETECTED NOT DETECTED Final   Parainfluenza Virus 4 NOT DETECTED NOT DETECTED Final   Respiratory Syncytial Virus NOT DETECTED NOT DETECTED Final   Bordetella pertussis NOT DETECTED NOT DETECTED Final   Chlamydophila pneumoniae NOT DETECTED NOT DETECTED Final   Mycoplasma pneumoniae NOT DETECTED NOT DETECTED Final    Comment: Performed at [facility], [place] [address], [place], [place] [address]  MRSA PCR Screening     Status: Abnormal   Collection Time: 02/17/18  5:40 AM  Result Value Ref Range Status   MRSA by PCR POSITIVE (A) NEGATIVE Final    Comment:        The GeneXpert MRSA Assay (FDA approved for NASAL specimens only), is one component of a comprehensive MRSA colonization surveillance program. It is not intended to diagnose MRSA infection nor to guide or monitor treatment for MRSA  infections. RESULT CALLED TO, READ BACK BY AND VERIFIED WITH: KEISHA @ 0350 ON 02/17/18 BY ROBINSON Z.  Culture, blood (routine x 2)     Status: None (Preliminary result)   Collection Time: 02/17/18  6:09 AM  Result Value Ref Range Status   Specimen Description BLOOD LEFT HAND  Final   Special Requests   Final    BOTTLES DRAWN AEROBIC ONLY Blood Culture results may not be optimal due to an inadequate volume of blood received in culture bottles   Culture   Final    NO GROWTH 3 DAYS Performed at [facility], [place] [address] [place],  [address]    Report Status PENDING  Incomplete         Radiology Studies: No results found.      Scheduled Meds: . [START ON 02/22/2018] aspirin  81 mg Oral Pre-Cath  . calcium acetate  667 mg Oral TID WC  . carvedilol  6.25 mg Oral BID WC  . Chlorhexidine Gluconate Cloth  6 each Topical Daily  . dolutegravir  50 mg Oral Daily  . emtricitabine-tenofovir AF  1 tablet Oral Daily  . heparin injection (subcutaneous)  5,000 Units Subcutaneous Q8H  . mouth rinse  15 mL Mouth Rinse BID  . mupirocin ointment   Nasal BID  . pantoprazole (PROTONIX) IV  40 mg Intravenous QHS  . scopolamine  1 patch Transdermal Q72H  . sodium chloride flush  10-40 mL Intracatheter Q12H  . sodium chloride flush  3 mL Intravenous Q12H  . sodium chloride flush  3 mL Intravenous Q12H   Continuous Infusions: . sodium chloride Stopped (02/19/18 1100)  . sodium chloride    . sodium chloride    . sodium chloride    . [START ON 02/22/2018] sodium chloride    . sodium chloride       LOS: 4 days    Time spent: 40 minutes    WOODS, Geraldo Docker, MD [facility] Pager [phone]   If 7PM-7AM, please contact night-coverage [email or web address] Password [facility] 02/21/2018, 7:52 AM

## 2018-02-21 NOTE — Progress Notes (Signed)
DAILY PROGRESS NOTE   Patient Name: David [name] Date of Encounter: 02/21/2018  Chief Complaint   No complaints  Patient Profile   David [name] is a 77 y.o. 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 Lake Bells.  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 He denies chest pain.  Objective   Vitals:   02/21/18 0600 02/21/18 0700 02/21/18 0751 02/21/18 0800  BP: (!) 145/78 133/75  (!) 125/92  Pulse: 79 76  74  Resp:  20  (!) 21  Temp:   98.5 F (36.9 C)   TempSrc:   Oral   SpO2: 97% 94%  94%  Weight:      Height:        Intake/Output Summary (Last 24 hours) at 02/21/2018 0910 Last data filed at 02/21/2018 0800 Gross per 24 hour  Intake 120 ml  Output 1301 ml  Net -1181 ml   Filed Weights   02/19/18 0238 02/20/18 0500 02/21/18 0500  Weight: 198 lb 6.6 oz (90 kg) 195 lb 8.8 oz (88.7 kg) 194 lb 7.1 oz (88.2 kg)    Physical Exam   General appearance: alert, no distress and sitting up in a chair Neck: no carotid bruit, no JVD and thyroid not enlarged, symmetric, no tenderness/mass/nodules Lungs: clear to auscultation bilaterally Heart: regular rate and rhythm and no murmur Abdomen: soft, non-tender; bowel sounds normal; no masses,  no organomegaly Extremities: extremities normal, atraumatic, no cyanosis or edema Pulses: 2+ and symmetric Skin: Skin color, texture, turgor normal. No rashes or lesions Neurologic: Mental status: Alert, oriented, thought content appropriate Psych: flat affect  Inpatient Medications    Scheduled Meds: . calcium acetate  667 mg Oral TID WC  . carvedilol  6.25 mg Oral BID WC  . Chlorhexidine Gluconate Cloth  6 each Topical Daily  . dolutegravir  50 mg Oral Daily  . emtricitabine-tenofovir AF  1  tablet Oral Daily  . heparin injection (subcutaneous)  5,000 Units Subcutaneous Q8H  . mouth rinse  15 mL Mouth Rinse BID  . mupirocin ointment   Nasal BID  . pantoprazole (PROTONIX) IV  40 mg Intravenous QHS  . scopolamine  1 patch Transdermal Q72H  . sodium chloride flush  10-40 mL Intracatheter Q12H  . sodium chloride flush  3 mL Intravenous Q12H    Continuous Infusions: . sodium chloride Stopped (02/19/18 1100)  . sodium chloride    . sodium chloride    . sodium chloride      PRN Meds: sodium chloride, sodium chloride, sodium chloride, sodium chloride, acetaminophen, alteplase, heparin, heparin, lidocaine (PF), lidocaine-prilocaine, midazolam, midazolam, ondansetron (ZOFRAN) IV, pentafluoroprop-tetrafluoroeth, sodium chloride flush, sodium chloride flush   Labs   Results for orders placed or performed during the hospital encounter of 02/17/18 (from the past 48 hour(s))  Glucose, capillary     Status: None   Collection Time: 02/19/18 11:49 AM  Result Value Ref Range   Glucose-Capillary 90 65 - 99 mg/dL   Comment 1 Capillary Specimen   Glucose, capillary     Status: Abnormal   Collection Time: 02/19/18  3:56 PM  Result Value Ref Range   Glucose-Capillary 113 (H) 65 - 99 mg/dL   Comment 1 Capillary Specimen   Glucose, capillary     Status:  Abnormal   Collection Time: 02/19/18  8:12 PM  Result Value Ref Range   Glucose-Capillary 121 (H) 65 - 99 mg/dL   Comment 1 Capillary Specimen   Glucose, capillary     Status: Abnormal   Collection Time: 02/19/18 11:52 PM  Result Value Ref Range   Glucose-Capillary 115 (H) 65 - 99 mg/dL   Comment 1 Capillary Specimen   CBC     Status: Abnormal   Collection Time: 02/20/18  3:05 AM  Result Value Ref Range   WBC 10.8 (H) 4.0 - 10.5 K/uL   RBC 3.30 (L) 4.22 - 5.81 MIL/uL   Hemoglobin 10.7 (L) 13.0 - 17.0 g/dL   HCT 33.1 (L) 39.0 - 52.0 %   MCV 100.3 (H) 78.0 - 100.0 fL   MCH 32.4 26.0 - 34.0 pg   MCHC 32.3 30.0 - 36.0 g/dL   RDW  14.1 11.5 - 15.5 %   Platelets 186 150 - 400 K/uL    Comment: Performed at [facility], [address] [place], [place] [address]  Basic metabolic panel     Status: Abnormal   Collection Time: 02/20/18  3:05 AM  Result Value Ref Range   Sodium 139 135 - 145 mmol/L   Potassium 3.4 (L) 3.5 - 5.1 mmol/L   Chloride 101 101 - 111 mmol/L   CO2 24 22 - 32 mmol/L   Glucose, Bld 116 (H) 65 - 99 mg/dL   BUN 78 (H) 6 - 20 mg/dL   Creatinine, Ser 5.06 (H) 0.61 - 1.24 mg/dL   Calcium 8.9 8.9 - 10.3 mg/dL   GFR calc non Af Amer 10 (L) >60 mL/min   GFR calc Af Amer 12 (L) >60 mL/min    Comment: (NOTE) The eGFR has been calculated using the CKD EPI equation. This calculation has not been validated in all clinical situations. eGFR's persistently <60 mL/min signify possible Chronic Kidney Disease.    Anion gap 14 5 - 15    Comment: Performed at [facility] [address], [place], [place] [address]  Magnesium     Status: None   Collection Time: 02/20/18  3:05 AM  Result Value Ref Range   Magnesium 2.2 1.7 - 2.4 mg/dL    Comment: Performed at [facility] [address] [place], [place] [address]  Phosphorus     Status: Abnormal   Collection Time: 02/20/18  3:05 AM  Result Value Ref Range   Phosphorus 6.0 (H) 2.5 - 4.6 mg/dL    Comment: Performed at [facility] [address], [place], [place] [address]  Glucose, capillary     Status: Abnormal   Collection Time: 02/20/18  3:41 AM  Result Value Ref Range   Glucose-Capillary 118 (H) 65 - 99 mg/dL   Comment 1 Capillary Specimen   Triglycerides     Status: None   Collection Time: 02/20/18  7:32 AM  Result Value Ref Range   Triglycerides 112 <150 mg/dL    Comment: Performed at [facility], [place] [address] [place], [place] [address]  Glucose, capillary     Status: Abnormal   Collection Time: 02/20/18  7:50 AM  Result Value Ref Range   Glucose-Capillary 114 (H) 65 - 99 mg/dL   Comment 1 Capillary Specimen    Glucose, capillary     Status: Abnormal   Collection Time: 02/20/18 12:12 PM  Result Value Ref Range   Glucose-Capillary 116 (H) 65 - 99  mg/dL   Comment 1 Capillary Specimen   Glucose, capillary     Status: Abnormal   Collection Time: 02/20/18  4:08 PM  Result Value Ref Range   Glucose-Capillary 125 (H) 65 - 99 mg/dL   Comment 1 Capillary Specimen   Glucose, capillary     Status: Abnormal   Collection Time: 02/20/18  8:01 PM  Result Value Ref Range   Glucose-Capillary 230 (H) 65 - 99 mg/dL   Comment 1 Notify RN   Glucose, capillary     Status: Abnormal   Collection Time: 02/20/18 11:44 PM  Result Value Ref Range   Glucose-Capillary 207 (H) 65 - 99 mg/dL   Comment 1 Capillary Specimen   Glucose, capillary     Status: Abnormal   Collection Time: 02/21/18  3:27 AM  Result Value Ref Range   Glucose-Capillary 178 (H) 65 - 99 mg/dL   Comment 1 Capillary Specimen   CBC     Status: Abnormal   Collection Time: 02/21/18  3:29 AM  Result Value Ref Range   WBC 8.9 4.0 - 10.5 K/uL   RBC 3.35 (L) 4.22 - 5.81 MIL/uL   Hemoglobin 10.8 (L) 13.0 - 17.0 g/dL   HCT 33.7 (L) 39.0 - 52.0 %   MCV 100.6 (H) 78.0 - 100.0 fL   MCH 32.2 26.0 - 34.0 pg   MCHC 32.0 30.0 - 36.0 g/dL   RDW 13.9 11.5 - 15.5 %   Platelets 192 150 - 400 K/uL    Comment: Performed at [facility], [place]. [address], [place], [place] [address]  Basic metabolic panel     Status: Abnormal   Collection Time: 02/21/18  3:29 AM  Result Value Ref Range   Sodium 142 135 - 145 mmol/L   Potassium 3.5 3.5 - 5.1 mmol/L   Chloride 103 101 - 111 mmol/L   CO2 26 22 - 32 mmol/L   Glucose, Bld 128 (H) 65 - 99 mg/dL   BUN 89 (H) 6 - 20 mg/dL   Creatinine, Ser 5.05 (H) 0.61 - 1.24 mg/dL   Calcium 9.2 8.9 - 10.3 mg/dL   GFR calc non Af Amer 10 (L) >60 mL/min   GFR calc Af Amer 12 (L) >60 mL/min    Comment: (NOTE) The eGFR has been calculated using the CKD EPI equation. This calculation has not been validated in all clinical  situations. eGFR's persistently <60 mL/min signify possible Chronic Kidney Disease.    Anion gap 13 5 - 15    Comment: Performed at [facility] [address], [place], [place] [address]  Magnesium     Status: None   Collection Time: 02/21/18  3:29 AM  Result Value Ref Range   Magnesium 2.4 1.7 - 2.4 mg/dL    Comment: Performed at [facility] [address], [place], [place] [address]  Phosphorus     Status: Abnormal   Collection Time: 02/21/18  3:29 AM  Result Value Ref Range   Phosphorus 4.9 (H) 2.5 - 4.6 mg/dL    Comment: Performed at [facility] [address] [place], [place] [address]  Glucose, capillary     Status: Abnormal   Collection Time: 02/21/18  7:54 AM  Result Value Ref Range   Glucose-Capillary 122 (H) 65 - 99 mg/dL   Comment 1 Notify RN     ECG   N/A  Telemetry   NSR - Personally Reviewed  Radiology    No results  found.  Cardiac Studies   LV EF: 15% -   20%  ------------------------------------------------------------------- History:   PMH:  Cardiac arrest.  Risk factors:  HIV. NICM. CKD. Hypertension.  ------------------------------------------------------------------- Study Conclusions  - Left ventricle: The cavity size was moderately dilated. There was   moderate concentric hypertrophy. Systolic function was normal.   The estimated ejection fraction was in the range of 15% to 20%.   Doppler parameters are consistent with abnormal left ventricular   relaxation (grade 1 diastolic dysfunction). - Ventricular septum: Septal motion showed paradox. - Aortic valve: There was mild regurgitation. - Left atrium: The atrium was mildly dilated.  Impressions:  - No prior study available for comparison. LV is moderately dilated   with severely decreased LVEF estimated at 15-20%. There is   diffuse hypokinesis with akinesis at the inferior and   inferolateral walls. RV is poorly visualized but appears to have   normal  size and systolic function.  Assessment   Principal Problem:   HIV (human immunodeficiency virus infection) ([place]) Active Problems:   Respiratory arrest ([place])   Acute pulmonary edema (HCC)   Acute on chronic systolic heart failure (HCC)   Chronic obstructive pulmonary disease (HCC)   Cardiac arrest (HCC)   Acute on chronic systolic and diastolic heart failure, NYHA class 4 (HCC)   ESRD (end stage renal disease) ([place])   Acute respiratory failure with hypoxemia ([place])   Plan   Cath is reassuring - at acceptable risk for dialysis fistula placement. Volume management per nephrology. On coreg 6.25 mg BID. Would avoid aldactone, ACE-I/ARB/ARNI as on dialysis. Statin not guideline indicated either. BP appears to be rebounding - consider adding hydralazine 25 mg BID for afterload reduction as bp allows with dialysis.  Time Spent Directly with Patient:  I have spent a total of 25 minutes with the patient reviewing hospital notes, telemetry, EKGs, labs and examining the patient as well as establishing an assessment and plan that was discussed personally with the patient. > 50% of time was spent in direct patient care.  Length of Stay:  LOS: 4 days   Pixie Casino, MD, [facility], [place] Director of the Advanced Lipid Disorders &  Cardiovascular Risk Reduction Clinic Diplomate of the American Board of Clinical Lipidology Attending Cardiologist  Direct Dial: [phone]  Fax: [phone]  Website:  [email or web address] 02/21/2018, 9:10 AM

## 2018-02-21 NOTE — Progress Notes (Signed)
Pt threw up while taking medicine, including a Phoslo tablet. States he started vomiting once he started taking the tablets a few days ago. Zofran give, will continue to monitor

## 2018-02-22 ENCOUNTER — Inpatient Hospital Stay (HOSPITAL_COMMUNITY): Payer: Medicare HMO

## 2018-02-22 DIAGNOSIS — N186 End stage renal disease: Secondary | ICD-10-CM

## 2018-02-22 DIAGNOSIS — Z992 Dependence on renal dialysis: Secondary | ICD-10-CM

## 2018-02-22 LAB — IRON AND TIBC
IRON: 27 ug/dL — AB (ref 45–182)
Saturation Ratios: 12 % — ABNORMAL LOW (ref 17.9–39.5)
TIBC: 218 ug/dL — ABNORMAL LOW (ref 250–450)
UIBC: 191 ug/dL

## 2018-02-22 LAB — GLUCOSE, CAPILLARY
GLUCOSE-CAPILLARY: 105 mg/dL — AB (ref 65–99)
GLUCOSE-CAPILLARY: 117 mg/dL — AB (ref 65–99)
GLUCOSE-CAPILLARY: 151 mg/dL — AB (ref 65–99)
GLUCOSE-CAPILLARY: 159 mg/dL — AB (ref 65–99)
GLUCOSE-CAPILLARY: 90 mg/dL (ref 65–99)
Glucose-Capillary: 117 mg/dL — ABNORMAL HIGH (ref 65–99)
Glucose-Capillary: 95 mg/dL (ref 65–99)

## 2018-02-22 LAB — CBC
HCT: 36.3 % — ABNORMAL LOW (ref 39.0–52.0)
HEMOGLOBIN: 11.9 g/dL — AB (ref 13.0–17.0)
MCH: 33 pg (ref 26.0–34.0)
MCHC: 32.8 g/dL (ref 30.0–36.0)
MCV: 100.6 fL — ABNORMAL HIGH (ref 78.0–100.0)
Platelets: 202 10*3/uL (ref 150–400)
RBC: 3.61 MIL/uL — AB (ref 4.22–5.81)
RDW: 13.5 % (ref 11.5–15.5)
WBC: 9.4 10*3/uL (ref 4.0–10.5)

## 2018-02-22 LAB — BASIC METABOLIC PANEL
ANION GAP: 16 — AB (ref 5–15)
BUN: 54 mg/dL — ABNORMAL HIGH (ref 6–20)
CHLORIDE: 99 mmol/L — AB (ref 101–111)
CO2: 23 mmol/L (ref 22–32)
Calcium: 9.4 mg/dL (ref 8.9–10.3)
Creatinine, Ser: 4.94 mg/dL — ABNORMAL HIGH (ref 0.61–1.24)
GFR calc non Af Amer: 10 mL/min — ABNORMAL LOW (ref >60)
GFR, EST AFRICAN AMERICAN: 12 mL/min — AB (ref >60)
Glucose, Bld: 122 mg/dL — ABNORMAL HIGH (ref 65–99)
POTASSIUM: 4 mmol/L (ref 3.5–5.1)
SODIUM: 138 mmol/L (ref 135–145)

## 2018-02-22 LAB — RETICULOCYTES
RBC.: 3.77 MIL/uL — AB (ref 4.22–5.81)
Retic Count, Absolute: 41.5 10*3/uL (ref 19.0–186.0)
Retic Ct Pct: 1.1 % (ref 0.4–3.1)

## 2018-02-22 LAB — CULTURE, BLOOD (ROUTINE X 2)
Culture: NO GROWTH
Culture: NO GROWTH
SPECIAL REQUESTS: ADEQUATE

## 2018-02-22 LAB — MAGNESIUM: MAGNESIUM: 2.3 mg/dL (ref 1.7–2.4)

## 2018-02-22 LAB — VITAMIN B12: VITAMIN B 12: 815 pg/mL (ref 180–914)

## 2018-02-22 LAB — FOLATE: FOLATE: 17.2 ng/mL (ref >5.9)

## 2018-02-22 LAB — FERRITIN: Ferritin: 910 ng/mL — ABNORMAL HIGH (ref 24–336)

## 2018-02-22 MED ORDER — CEFAZOLIN SODIUM-DEXTROSE 2-4 GM/100ML-% IV SOLN
2.0000 g | INTRAVENOUS | Status: AC
  Administered 2018-02-25: 2 g via INTRAVENOUS
  Filled 2018-02-22 (×2): qty 100

## 2018-02-22 NOTE — Progress Notes (Signed)
Progress Note  Patient Name: David [name] Date of Encounter: 02/22/2018  Primary Cardiologist: Skeet Latch, MD   Subjective   No complains. Asking for discharge timing.   Inpatient Medications    Scheduled Meds: . calcium acetate  667 mg Oral TID WC  . carvedilol  6.25 mg Oral BID WC  . Chlorhexidine Gluconate Cloth  6 each Topical Daily  . dolutegravir  50 mg Oral Daily  . emtricitabine-tenofovir AF  1 tablet Oral Daily  . heparin injection (subcutaneous)  5,000 Units Subcutaneous Q8H  . mouth rinse  15 mL Mouth Rinse BID  . mupirocin ointment   Nasal BID  . pantoprazole (PROTONIX) IV  40 mg Intravenous QHS  . scopolamine  1 patch Transdermal Q72H  . sodium chloride flush  10-40 mL Intracatheter Q12H  . sodium chloride flush  3 mL Intravenous Q12H   Continuous Infusions: . sodium chloride Stopped (02/19/18 1100)  . sodium chloride    . sodium chloride    . sodium chloride     PRN Meds: sodium chloride, sodium chloride, sodium chloride, sodium chloride, acetaminophen, alteplase, heparin, heparin, lidocaine (PF), lidocaine-prilocaine, midazolam, midazolam, ondansetron (ZOFRAN) IV, pentafluoroprop-tetrafluoroeth, sodium chloride flush, sodium chloride flush   Vital Signs    Vitals:   02/22/18 0003 02/22/18 0435 02/22/18 0839 02/22/18 0915  BP: (!) 141/80 134/85 118/76 118/76  Pulse: 83 86 83 84  Resp: (!) 26 (!) 21 20 18   Temp: 98.2 F (36.8 C) 98.8 F (37.1 C) 98 F (36.7 C) 97.9 F (36.6 C)  TempSrc: Oral Oral Oral Oral  SpO2: 99% 99% 100% 97%  Weight:  187 lb 8 oz (85 kg)    Height:        Intake/Output Summary (Last 24 hours) at 02/22/2018 0940 Last data filed at 02/22/2018 2956 Gross per 24 hour  Intake 360 ml  Output 2500 ml  Net -2140 ml   Filed Weights   02/21/18 1020 02/21/18 1420 02/22/18 0435  Weight: 191 lb 12.8 oz (87 kg) 187 lb 6.3 oz (85 kg) 187 lb 8 oz (85 kg)    Telemetry    SR with PVCs - Personally Reviewed  ECG    N/A  Physical Exam   GEN: No acute distress.   Neck: No JVD Cardiac: RRR, no murmurs, rubs, or gallops.  Respiratory: Clear to auscultation bilaterally. GI: Soft, nontender, non-distended  MS: No edema; No deformity. Neuro:  Nonfocal  Psych: Normal affect   Labs    Chemistry Recent Labs  Lab 02/17/18 0300  02/18/18 1840  02/20/18 0305 02/21/18 0329 02/22/18 0336  NA 139   < > 139   < > 139 142 138  K 3.8   < > 3.3*   < > 3.4* 3.5 4.0  CL 104   < > 104   < > 101 103 99*  CO2 19*   < > 24   < > 24 26 23   GLUCOSE 157*   < > 115*   < > 116* 128* 122*  BUN 81*   < > 102*   < > 78* 89* 54*  CREATININE 5.32*   < > 5.60*   < > 5.06* 5.05* 4.94*  CALCIUM 8.8*   < > 8.5*   < > 8.9 9.2 9.4  PROT 7.9  --   --   --   --   --   --   ALBUMIN 4.1  --  3.1*  --   --  3.2*  --   AST 27  --   --   --   --   --   --   ALT 22  --   --   --   --   --   --   ALKPHOS 117  --   --   --   --   --   --   BILITOT 0.6  --   --   --   --   --   --   GFRNONAA 9*   < > 9*   < > 10* 10* 10*  GFRAA 11*   < > 10*   < > 12* 12* 12*  ANIONGAP 16*   < > 11   < > 14 13 16*   < > = values in this interval not displayed.     Hematology Recent Labs  Lab 02/20/18 0305 02/21/18 0329 02/22/18 0336 02/22/18 0752  WBC 10.8* 8.9 9.4  --   RBC 3.30* 3.35* 3.61* 3.77*  HGB 10.7* 10.8* 11.9*  --   HCT 33.1* 33.7* 36.3*  --   MCV 100.3* 100.6* 100.6*  --   MCH 32.4 32.2 33.0  --   MCHC 32.3 32.0 32.8  --   RDW 14.1 13.9 13.5  --   PLT 186 192 202  --     Cardiac Enzymes Recent Labs  Lab 02/17/18 0455 02/17/18 1020 02/17/18 1633  TROPONINI 0.08* 0.07* 0.07*    Recent Labs  Lab 02/17/18 0309  TROPIPOC 0.05     BNP Recent Labs  Lab 02/17/18 0300  BNP 800.9*     Radiology    No results found.  Cardiac Studies  Echo 02/18/18: Study Conclusions  - Left ventricle: The cavity size was moderately dilated. There was moderate concentric hypertrophy. Systolic function was  normal. The estimated ejection fraction was in the range of 15% to 20%. Doppler parameters are consistent with abnormal left ventricular relaxation (grade 1 diastolic dysfunction). - Ventricular septum: Septal motion showed paradox. - Aortic valve: There was mild regurgitation. - Left atrium: The atrium was mildly dilated.  Impressions:  - No prior study available for comparison. LV is moderately dilated with severely decreased LVEF estimated at 15-20%. There is diffuse hypokinesis with akinesis at the inferior and inferolateral walls. RV is poorly visualized but appears to have normal size and systolic function.  LEFT HEART CATH AND CORONARY ANGIOGRAPHY  02/20/18  Conclusion     Ost 2nd Mrg to 2nd Mrg lesion is 50% stenosed.  Mid LAD lesion is 25% stenosed.  LV end diastolic pressure is normal. LVEDP 6 mm Hg.  There is no aortic valve stenosis.   Nonobstructive CAD.  Continue medical therapy.       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 Acute on chronic combined CHF - LVEF of 15-20%. Cath showed non obstructive CAD. Likely NICM.  - Volume managed by nephrology. Would avoid aldactone, ACE-I/ARB/ARNI as on dialysis.  - Continue Coreg. Consider adding hydralazine 25 mg BID for afterload reduction as bp allows with dialysis.  2. ESRD - per nephrology. Based on ACC/AHA guidelines, David Tavella  [name] would be at acceptable risk for the planned procedure without further cardiovascular testing.    For questions or updates, please contact [facility] Please consult [email or web address] for  contact info under Cardiology/STEMI.      Jarrett Soho, PA  02/22/2018, 9:40 AM

## 2018-02-22 NOTE — Care Management Note (Addendum)
Case Management Note Marvetta Gibbons RN, BSN Unit 4E-Case Manager [phone]  Patient Details  Name: David [name] MRN: [id number] Date of Birth: Jun 20, 1941  Subjective/Objective:  Pt admitted s/p cardiac arrest.  Worsening RF, acute on chronic HF                  Action/Plan: PTA pt lived at home with wife and  family, has 24/7 assistance for discharge- per PT eval no recommendations for [name] f/u at discharge- Pt has been started on HD this admission which is new- and is in the clipping process for outpt HD spot. Plan for access next week. - CM will follow for transition of care needs.   Expected Discharge Date:                  Expected Discharge Plan:  Home/Self Care  In-House Referral:  NA  Discharge planning Services  CM Consult  Post Acute Care Choice:    Choice offered to:     DME Arranged:    DME Agency:     HH Arranged:    HH Agency:     Status of Service:  In process, will continue to follow  If discussed at Long Length of Stay Meetings, dates discussed:    Discharge Disposition:   Additional Comments:  Dawayne Patricia, RN 02/22/2018, 4:02 PM

## 2018-02-22 NOTE — Progress Notes (Signed)
Physical Therapy Treatment Patient Details Name: Rochelle Nephew MRN: [id number] DOB: 12-Sep-1941 Today's Date: 02/22/2018    History of Present Illness Pt is a 77 y.o. male admitted 02/17/18 with severe SOB and PEA arrest upon arrival requiring CPR before return to [facility]; intubated 3/17-3/19. CXR consistent with pulmonary edema. CXR 3/18 shows bilateral pnuemonia. R IJ tunneled HD catheter placed 3/18 to initiate HD. 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    PT Comments    Pt slowly progressing with mobility. Amb 200' with RW and min guard for balance; required repeated cues to maintain upright posture and safe proximity to RW, as pt tends to flex forward. Max encouragement on importance of continued ambulation daily with nursing staff during hospital admission. Pt motivated to return home. Will follow acutely.   Follow Up Recommendations  No PT follow up;Supervision/Assistance - 24 hour     Equipment Recommendations  None recommended by PT    Recommendations for Other Services       Precautions / Restrictions Precautions Precautions: Fall Restrictions Weight Bearing Restrictions: No    Mobility  Bed Mobility Overal bed mobility: Modified Independent             General bed mobility comments: Increased time and effort, but no physical assist required  Transfers Overall transfer level: Needs assistance Equipment used: Rolling walker (2 wheeled) Transfers: Sit to/from Stand Sit to Stand: Min guard            Ambulation/Gait Ambulation/Gait assistance: Min guard Ambulation Distance (Feet): 200 Feet Assistive device: Rolling walker (2 wheeled) Gait Pattern/deviations: Step-through pattern;Decreased stride length;Decreased dorsiflexion - right;Antalgic Gait velocity: Decreased Gait velocity interpretation: <1.8 ft/sec, indicative of risk for  recurrent falls General Gait Details: Slow, slightly unsteady amb with RW and min guard for balance. Pt requires repeated cues to maintain closer proximity to RW and upright posture; tends to forward flex, and let RW get too far away with increased speed   Stairs            Wheelchair Mobility    Modified Rankin (Stroke Patients Only)       Balance Overall balance assessment: Needs assistance   Sitting balance-Leahy Scale: Good       Standing balance-Leahy Scale: Poor Standing balance comment: Reliant on UE support                            Cognition Arousal/Alertness: Awake/alert Behavior During Therapy: Flat affect Overall Cognitive Status: Within Functional Limits for tasks assessed                                 General Comments: Slight delay in processing (?), but overall WFL      Exercises      General Comments        Pertinent Vitals/Pain Pain Assessment: No/denies pain    Home Living                      Prior Function            PT Goals (current goals can now be found in the care plan section) Acute Rehab PT Goals Patient Stated Goal: Return home PT Goal Formulation: With patient Time For Goal Achievement: 03/06/18 Potential to Achieve Goals: Good Progress towards PT  goals: Progressing toward goals    Frequency    Min 3X/week      PT Plan Current plan remains appropriate    Co-evaluation              AM-PAC PT "6 Clicks" Daily Activity  Outcome Measure  Difficulty turning over in bed (including adjusting bedclothes, sheets and blankets)?: None Difficulty moving from lying on back to sitting on the side of the bed? : None Difficulty sitting down on and standing up from a chair with arms (e.g., wheelchair, bedside commode, etc,.)?: A Little Help needed moving to and from a bed to chair (including a wheelchair)?: A Little Help needed walking in hospital room?: A Little Help needed  climbing 3-5 steps with a railing? : A Little 6 Click Score: 20    End of Session Equipment Utilized During Treatment: Gait belt Activity Tolerance: Patient tolerated treatment well Patient left: in chair;with call bell/phone within reach Nurse Communication: Mobility status PT Visit Diagnosis: Other abnormalities of gait and mobility (R26.89)     Time: 1410-1430 PT Time Calculation (min) (ACUTE ONLY): 20 min  Charges:  $Therapeutic Activity: 8-22 mins                    G Codes:      Mabeline Caras, PT, DPT Acute Rehab Services  Pager: [place] 02/22/2018, 3:13 PM

## 2018-02-22 NOTE — Progress Notes (Signed)
Instructed pt on use of flutter valve and pt able to teach back. Left flutter valve within reach so pt can use on his own.

## 2018-02-22 NOTE — Progress Notes (Signed)
Received report from RN; Patient A/Ox4; No complaint of pain; family visiting; Will continue to monitor

## 2018-02-22 NOTE — Plan of Care (Signed)
  Problem: Health Behavior/Discharge Planning: Goal: Ability to manage health-related needs will improve Outcome: Progressing   Problem: Clinical Measurements: Goal: Ability to maintain clinical measurements within normal limits will improve Outcome: Progressing Goal: Will remain free from infection Outcome: Progressing Goal: Diagnostic test results will improve Outcome: Progressing Goal: Respiratory complications will improve Outcome: Progressing Goal: Cardiovascular complication will be avoided Outcome: Progressing   Problem: Activity: Goal: Risk for activity intolerance will decrease Outcome: Progressing   Problem: Nutrition: Goal: Adequate nutrition will be maintained Outcome: Progressing   Problem: Elimination: Goal: Will not experience complications related to bowel motility Outcome: Progressing Goal: Will not experience complications related to urinary retention Outcome: Progressing   Problem: Safety: Goal: Ability to remain free from injury will improve Outcome: Progressing   Problem: Skin Integrity: Goal: Risk for impaired skin integrity will decrease Outcome: Progressing   Problem: Activity: Goal: Ability to tolerate increased activity will improve Outcome: Progressing   Problem: Education: Goal: Knowledge of disease and its progression will improve Outcome: Progressing   Problem: Coping: Goal: Development of coping mechanisms to deal with changes in body function or appearance will improve Outcome: Progressing   Problem: Fluid Volume: Goal: Compliance with measures to maintain balanced fluid volume will improve Outcome: Progressing   Problem: Health Behavior/Discharge Planning: Goal: Ability to manage health-related needs will improve Outcome: Progressing   Problem: Nutritional: Goal: Ability to make healthy dietary choices will improve Outcome: Progressing   Problem: Physical Regulation: Goal: Complications related to the disease process,  condition or treatment will be avoided or minimized Outcome: Progressing

## 2018-02-22 NOTE — Progress Notes (Signed)
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  In ED patient was noted to be agonal and pulseless in parking lot. CPR started and continued for estimated 5-10 minutes before return of ROSC. Intubated. CXR consistent with pulmonary edema. Creatinine 5.32, LA 4.44. PCCM asked to admit.   He has been started on dialysis and is undergoing the CLIP process  He is scheduled for access probably next week       Objective:  Vital signs in last 24 hours:  Temp:  [97.9 F (36.6 C)-99.1 F (37.3 C)] 97.9 F (36.6 C) (03/22 0915) Pulse Rate:  [74-86] 84 (03/22 0915) Resp:  [18-26] 18 (03/22 0915) BP: (100-141)/(60-90) 118/76 (03/22 0915) SpO2:  [97 %-100 %] 97 % (03/22 0915) Weight:  [187 lb 6.3 oz (85 kg)-187 lb 8 oz (85 kg)] 187 lb 8 oz (85 kg) (03/22 0435)  Weight change: -2 lb 10.3 oz (-1.2 kg) Filed Weights   02/21/18 1020 02/21/18 1420 02/22/18 0435  Weight: 191 lb 12.8 oz (87 kg) 187 lb 6.3 oz (85 kg) 187 lb 8 oz (85 kg)    Intake/Output: I/O last 3 completed shifts: In: 230 [P.O.:220; I.V.:10] Out: 0086 [Urine:975; Emesis/NG output:100; Other:2400; Stool:1]   Intake/Output this shift:  Total I/O In: 240 [P.O.:240] Out: -    General appearance: alert, no distress and sitting up in a chair Neck: no carotid bruit, no JVD and thyroid not enlarged, symmetric, no tenderness/mass/nodules Lungs: clear to auscultation bilaterally Heart: regular rate and rhythm and no murmur Abdomen: soft, non-tender; bowel sounds normal; no masses,  no organomegaly Extremities: extremities normal, atraumatic, no cyanosis or edema Pulses: 2+ and symmetric Skin: Skin color, texture, turgor normal. No rashes or lesions Neurologic: Mental status: Alert, oriented, thought content appropriate Psych: flat  affect    Basic Metabolic Panel: Recent Labs  Lab 02/18/18 0558 02/18/18 1840 02/19/18 0252 02/20/18 0305 02/21/18 0329 02/22/18 0336  NA 136 139 139 139 142 138  K 3.1* 3.3* 3.4* 3.4* 3.5 4.0  CL 102 104 101 101 103 99*  CO2 20* 24 24 24 26 23   GLUCOSE 147* 115* 116* 116* 128* 122*  BUN 96* 102* 71* 78* 89* 54*  CREATININE 5.46* 5.60* 4.34* 5.06* 5.05* 4.94*  CALCIUM 8.2* 8.5* 8.6* 8.9 9.2 9.4  MG 2.1 2.3 2.2 2.2 2.4 2.3  PHOS 3.8 3.9  3.8 3.7 6.0* 4.9*  --     Liver Function Tests: Recent Labs  Lab 02/17/18 0300 02/18/18 1840 02/21/18 0329  AST 27  --   --   ALT 22  --   --   ALKPHOS 117  --   --   BILITOT 0.6  --   --   PROT 7.9  --   --   ALBUMIN 4.1 3.1* 3.2*   No results for input(s): LIPASE, AMYLASE in the last 168 hours. No results for input(s): AMMONIA in the last 168 hours.  CBC: Recent Labs  Lab 02/17/18 0300  02/17/18 1020  02/18/18 2019 02/19/18 0252 02/20/18 0305 02/21/18 0329 02/22/18 0336  WBC 10.9*  --  9.7   < > 11.6* 10.7* 10.8* 8.9 9.4  NEUTROABS 3.5  --  8.5*  --   --   --   --   --   --   HGB 13.5   < >  11.4*   < > 10.3* 11.1* 10.7* 10.8* 11.9*  HCT 41.5   < > 35.8*   < > 32.8* 33.6* 33.1* 33.7* 36.3*  MCV 103.0*  --  100.0   < > 98.8 100.6* 100.3* 100.6* 100.6*  PLT 256  --  234   < > 183 189 186 192 202   < > = values in this interval not displayed.    Cardiac Enzymes: Recent Labs  Lab 02/17/18 0455 02/17/18 1020 02/17/18 1633  TROPONINI 0.08* 0.07* 0.07*    BNP: Invalid input(s): POCBNP  CBG: Recent Labs  Lab 02/21/18 1607 02/21/18 2027 02/22/18 0000 02/22/18 0433 02/22/18 0932  GLUCAP 74 121* 105* 159* 151*    Microbiology: Results for orders placed or performed during the hospital encounter of 02/17/18  Culture, blood (routine x 2)     Status: None (Preliminary result)   Collection Time: 02/17/18  3:07 AM  Result Value Ref Range Status   Specimen Description BLOOD RIGHT HAND  Final   Special Requests  IN PEDIATRIC BOTTLE Blood Culture adequate volume  Final   Culture   Final    NO GROWTH 4 DAYS Performed at [facility], [place] [address], [place], [place] [address]    Report Status PENDING  Incomplete  Respiratory Panel by PCR     Status: None   Collection Time: 02/17/18  5:40 AM  Result Value Ref Range Status   Adenovirus NOT DETECTED NOT DETECTED Final   Coronavirus 229E NOT DETECTED NOT DETECTED Final   Coronavirus HKU1 NOT DETECTED NOT DETECTED Final   Coronavirus NL63 NOT DETECTED NOT DETECTED Final   Coronavirus OC43 NOT DETECTED NOT DETECTED Final   Metapneumovirus NOT DETECTED NOT DETECTED Final   Rhinovirus / Enterovirus NOT DETECTED NOT DETECTED Final   Influenza A NOT DETECTED NOT DETECTED Final   Influenza B NOT DETECTED NOT DETECTED Final   Parainfluenza Virus 1 NOT DETECTED NOT DETECTED Final   Parainfluenza Virus 2 NOT DETECTED NOT DETECTED Final   Parainfluenza Virus 3 NOT DETECTED NOT DETECTED Final   Parainfluenza Virus 4 NOT DETECTED NOT DETECTED Final   Respiratory Syncytial Virus NOT DETECTED NOT DETECTED Final   Bordetella pertussis NOT DETECTED NOT DETECTED Final   Chlamydophila pneumoniae NOT DETECTED NOT DETECTED Final   Mycoplasma pneumoniae NOT DETECTED NOT DETECTED Final    Comment: Performed at [facility], [place] [address] [place], [place] [address]  MRSA PCR Screening     Status: Abnormal   Collection Time: 02/17/18  5:40 AM  Result Value Ref Range Status   MRSA by PCR POSITIVE (A) NEGATIVE Final    Comment:        The GeneXpert MRSA Assay (FDA approved for NASAL specimens only), is one component of a comprehensive MRSA colonization surveillance program. It is not intended to diagnose MRSA infection nor to guide or monitor treatment for MRSA infections. RESULT CALLED TO, READ BACK BY AND VERIFIED WITH: KEISHA @ 4132 ON 02/17/18 BY ROBINSON Z.   Culture, blood (routine x 2)     Status: None (Preliminary result)   Collection Time:  02/17/18  6:09 AM  Result Value Ref Range Status   Specimen Description BLOOD LEFT HAND  Final   Special Requests   Final    BOTTLES DRAWN AEROBIC ONLY Blood Culture results may not be optimal due to an inadequate volume of blood received in culture bottles   Culture   Final    NO GROWTH  4 DAYS Performed at [facility], [place] [address], [place], [place] [address]    Report Status PENDING  Incomplete    Coagulation Studies: No results for input(s): LABPROT, INR in the last 72 hours.  Urinalysis: No results for input(s): COLORURINE, LABSPEC, PHURINE, GLUCOSEU, HGBUR, BILIRUBINUR, KETONESUR, PROTEINUR, UROBILINOGEN, NITRITE, LEUKOCYTESUR in the last 72 hours.  Invalid input(s): APPERANCEUR    Imaging: No results found.   Medications:   . sodium chloride Stopped (02/19/18 1100)  . sodium chloride    . sodium chloride    . sodium chloride     . calcium acetate  667 mg Oral TID WC  . carvedilol  6.25 mg Oral BID WC  . Chlorhexidine Gluconate Cloth  6 each Topical Daily  . dolutegravir  50 mg Oral Daily  . emtricitabine-tenofovir AF  1 tablet Oral Daily  . heparin injection (subcutaneous)  5,000 Units Subcutaneous Q8H  . mouth rinse  15 mL Mouth Rinse BID  . mupirocin ointment   Nasal BID  . pantoprazole (PROTONIX) IV  40 mg Intravenous QHS  . scopolamine  1 patch Transdermal Q72H  . sodium chloride flush  10-40 mL Intracatheter Q12H  . sodium chloride flush  3 mL Intravenous Q12H   sodium chloride, sodium chloride, sodium chloride, sodium chloride, acetaminophen, alteplase, heparin, heparin, lidocaine (PF), lidocaine-prilocaine, midazolam, midazolam, ondansetron (ZOFRAN) IV, pentafluoroprop-tetrafluoroeth, sodium chloride flush, sodium chloride flush  Assessment/ Plan:   ESRD- new ESRD  Undergoing CLIP process  Possible AVG next week  ANEMIA- Hb 11.9   MBD- difficulty tolerating phoslo  Will stop at this point  Phos in 4 range we will follow  HTN/VOL-   Controlled   ACCESS- needs permanent access   OTHER- EF 20 %     LOS: 5 Glinda Natzke W @TODAY @10 :31 AM

## 2018-02-22 NOTE — Progress Notes (Signed)
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 Wife reports that patient has had a dry cough last several days however, other than that has been in usual state on health. This morning around 2-3 am patient woke up with severe shortness of breath asking to be taken to ED. Upon arrival to ED patient was noted to be agonal and pulseless in parking lot. CPR started and continued for estimated 5-10 minutes before return of ROSC. Intubated. CXR consistent with pulmonary edema. Creatinine 5.32, LA 4.44. PCCM asked to admit.     Subjective: 3/22 A/O x4, negative CP, negative S OB, negative abdominal pain.   Assessment & Plan:   Principal Problem:   HIV (human immunodeficiency virus infection) ([place]) Active Problems:   Respiratory arrest ([place])   Acute pulmonary edema (HCC)   Acute on chronic systolic heart failure (HCC)   Chronic obstructive pulmonary disease (HCC)   Cardiac arrest (HCC)   Acute on chronic systolic and diastolic heart failure, NYHA class 4 (HCC)   ESRD (end stage renal disease) ([place])   Acute respiratory failure with hypoxemia (HCC)  Acute respiratory failure with hypoxia -Most likely secondary to PEA, decompensated CHF. -Titrate O2 to maintain SPO2> 93% -Flutter valve -Ambulate every shift   HIV/AIDS -Continue antiretrovirals per ID -CD4 count pending/HIV RNA quant pending   PEA/Asystole Arrest ROSC 5-10 minutes/Nonischemic Cardiomyopathy/Chronic Diastolic CHF -CXR: Cardiomegaly perihilar opacities, pulmonary edema? -Cleared by cardiology for AV fistula placement, clipping    New ESRD -HD per nephrology -Vein mapping and repair/new AV fistula per VVS -Per Dr. Ruta Hinds [facility]  appears they will place AV graft LEFT on next week  Anemia unspecified type -Anemia panel pending  Prostate Cancer s/p brachytherapy       DVT prophylaxis: Subcu heparin Code Status: Full Family Communication: Wife and family at bedside for discussion of plan of care Disposition Plan: TBD   Consultants:  Cardiology Nephrology    Procedures/Significant Events:  No results found. STUDIES:  CXR 3/17 > 1. Endotracheal tube 7.4 cm from the carina. Enteric tube in place with tip visualized in the stomach. 2. Cardiomegaly. Bilateral ill-defined perihilar opacities, favoring pulmonary edema. Multifocal pneumonia or aspiration could have a similar appearance. CT Chest 3/17 >> CT Head 3/17 >> ECHO 3/17 >>  ETT 3/17 >>3/19     I have personally reviewed and interpreted all radiology studies and my findings are as above.  VENTILATOR SETTINGS:    Cultures 3/17 blood NGTD 3/17 MRSA by PCR positive 3/17 respiratory virus panel negative 3/18 hepatitis B negative     Antimicrobials: Anti-infectives (From admission, onward)   Start     Stop   02/18/18 1330  ceFAZolin (ANCEF) IVPB 2g/100 mL premix     02/18/18 1409   02/17/18 1200  dolutegravir (TIVICAY) tablet 50 mg         02/17/18 1200  emtricitabine-tenofovir AF (DESCOVY) 200-25 MG per tablet 1 tablet             Devices    LINES / TUBES:  Right IJ tunneled HD cath>>>    Continuous Infusions: . sodium chloride Stopped (02/19/18 1100)  . sodium chloride    . sodium chloride    . sodium chloride  Objective: Vitals:   02/21/18 1457 02/21/18 1937 02/22/18 0003 02/22/18 0435  BP: 124/89 117/78 (!) 141/80 134/85  Pulse: 77 83 83 86  Resp: (!) 25 18 (!) 26 (!) 21  Temp: 97.9 F (36.6 C) 99.1 F (37.3 C) 98.2 F (36.8 C) 98.8 F (37.1 C)  TempSrc: Oral Oral Oral Oral  SpO2: 100%  99% 99%  Weight:    187 lb 8 oz (85 kg)  Height:        Intake/Output Summary (Last 24 hours) at 02/22/2018  0744 Last data filed at 02/21/2018 1700 Gross per 24 hour  Intake 230 ml  Output 2500 ml  Net -2270 ml   Filed Weights   02/21/18 1020 02/21/18 1420 02/22/18 0435  Weight: 191 lb 12.8 oz (87 kg) 187 lb 6.3 oz (85 kg) 187 lb 8 oz (85 kg)    Physical Exam:  General: A/O x4, No acute respiratory distress Neck:  Negative scars, masses, torticollis, lymphadenopathy, JVD Lungs: Clear to auscultation bilaterally without wheezes or crackles Cardiovascular: Regular rate and rhythm without murmur gallop or rub normal S1 and S2 Abdomen: negative abdominal pain, nondistended, positive soft, bowel sounds, no rebound, no ascites, no appreciable mass Extremities: No significant cyanosis, clubbing, or edema bilateral lower extremities Skin: Negative rashes, lesions, ulcers Psychiatric:  Negative depression, negative anxiety, negative fatigue, negative mania  Central nervous system:  Cranial nerves II through XII intact, tongue/uvula midline, all extremities muscle strength 5/5, sensation intact throughout, negative dysarthria, negative expressive aphasia, negative receptive aphasia.   .     Data Reviewed: Care during the described time interval was provided by me .  I have reviewed this patient's available data, including medical history, events of note, physical examination, and all test results as part of my evaluation.   CBC: Recent Labs  Lab 02/17/18 0300  02/17/18 1020  02/18/18 2019 02/19/18 0252 02/20/18 0305 02/21/18 0329 02/22/18 0336  WBC 10.9*  --  9.7   < > 11.6* 10.7* 10.8* 8.9 9.4  NEUTROABS 3.5  --  8.5*  --   --   --   --   --   --   HGB 13.5   < > 11.4*   < > 10.3* 11.1* 10.7* 10.8* 11.9*  HCT 41.5   < > 35.8*   < > 32.8* 33.6* 33.1* 33.7* 36.3*  MCV 103.0*  --  100.0   < > 98.8 100.6* 100.3* 100.6* 100.6*  PLT 256  --  234   < > 183 189 186 192 202   < > = values in this interval not displayed.   Basic Metabolic Panel: Recent Labs  Lab 02/18/18 0558 02/18/18 1840  02/19/18 0252 02/20/18 0305 02/21/18 0329 02/22/18 0336  NA 136 139 139 139 142 138  K 3.1* 3.3* 3.4* 3.4* 3.5 4.0  CL 102 104 101 101 103 99*  CO2 20* 24 24 24 26 23   GLUCOSE 147* 115* 116* 116* 128* 122*  BUN 96* 102* 71* 78* 89* 54*  CREATININE 5.46* 5.60* 4.34* 5.06* 5.05* 4.94*  CALCIUM 8.2* 8.5* 8.6* 8.9 9.2 9.4  MG 2.1 2.3 2.2 2.2 2.4 2.3  PHOS 3.8 3.9  3.8 3.7 6.0* 4.9*  --    GFR: Estimated Creatinine Clearance: 15 mL/min (A) (by C-G formula based on SCr of 4.94 mg/dL (H)). Liver Function Tests: Recent Labs  Lab 02/17/18 0300 02/18/18 1840 02/21/18 0329  AST 27  --   --   ALT 22  --   --  ALKPHOS 117  --   --   BILITOT 0.6  --   --   PROT 7.9  --   --   ALBUMIN 4.1 3.1* 3.2*   No results for input(s): LIPASE, AMYLASE in the last 168 hours. No results for input(s): AMMONIA in the last 168 hours. Coagulation Profile: Recent Labs  Lab 02/17/18 0300  INR 0.98   Cardiac Enzymes: Recent Labs  Lab 02/17/18 0455 02/17/18 1020 02/17/18 1633  TROPONINI 0.08* 0.07* 0.07*   BNP (last 3 results) No results for input(s): PROBNP in the last 8760 hours. HbA1C: No results for input(s): HGBA1C in the last 72 hours. CBG: Recent Labs  Lab 02/21/18 0754 02/21/18 1607 02/21/18 2027 02/22/18 0000 02/22/18 0433  GLUCAP 122* 74 121* 105* 159*   Lipid Profile: Recent Labs    02/20/18 0732  TRIG 112   Thyroid Function Tests: No results for input(s): TSH, T4TOTAL, FREET4, T3FREE, THYROIDAB in the last 72 hours. Anemia Panel: No results for input(s): VITAMINB12, FOLATE, FERRITIN, TIBC, IRON, RETICCTPCT in the last 72 hours. Urine analysis:    Component Value Date/Time   COLORURINE YELLOW 02/17/2018 0540   APPEARANCEUR CLOUDY (A) 02/17/2018 0540   LABSPEC 1.010 02/17/2018 0540   PHURINE 5.0 02/17/2018 0540   GLUCOSEU NEGATIVE 02/17/2018 0540   HGBUR LARGE (A) 02/17/2018 0540   BILIRUBINUR NEGATIVE 02/17/2018 0540   KETONESUR NEGATIVE 02/17/2018 0540    PROTEINUR 100 (A) 02/17/2018 0540   NITRITE NEGATIVE 02/17/2018 0540   LEUKOCYTESUR NEGATIVE 02/17/2018 0540   Sepsis Labs: @LABRCNTIP (procalcitonin:4,lacticidven:4)  ) Recent Results (from the past 240 hour(s))  Culture, blood (routine x 2)     Status: None (Preliminary result)   Collection Time: 02/17/18  3:07 AM  Result Value Ref Range Status   Specimen Description BLOOD RIGHT HAND  Final   Special Requests IN PEDIATRIC BOTTLE Blood Culture adequate volume  Final   Culture   Final    NO GROWTH 4 DAYS Performed at [facility], [place] [address] [place], [place] [address]    Report Status PENDING  Incomplete  Respiratory Panel by PCR     Status: None   Collection Time: 02/17/18  5:40 AM  Result Value Ref Range Status   Adenovirus NOT DETECTED NOT DETECTED Final   Coronavirus 229E NOT DETECTED NOT DETECTED Final   Coronavirus HKU1 NOT DETECTED NOT DETECTED Final   Coronavirus NL63 NOT DETECTED NOT DETECTED Final   Coronavirus OC43 NOT DETECTED NOT DETECTED Final   Metapneumovirus NOT DETECTED NOT DETECTED Final   Rhinovirus / Enterovirus NOT DETECTED NOT DETECTED Final   Influenza A NOT DETECTED NOT DETECTED Final   Influenza B NOT DETECTED NOT DETECTED Final   Parainfluenza Virus 1 NOT DETECTED NOT DETECTED Final   Parainfluenza Virus 2 NOT DETECTED NOT DETECTED Final   Parainfluenza Virus 3 NOT DETECTED NOT DETECTED Final   Parainfluenza Virus 4 NOT DETECTED NOT DETECTED Final   Respiratory Syncytial Virus NOT DETECTED NOT DETECTED Final   Bordetella pertussis NOT DETECTED NOT DETECTED Final   Chlamydophila pneumoniae NOT DETECTED NOT DETECTED Final   Mycoplasma pneumoniae NOT DETECTED NOT DETECTED Final    Comment: Performed at [facility], [place] [address] [place],  [address]  MRSA PCR Screening     Status: Abnormal   Collection Time: 02/17/18  5:40 AM  Result Value Ref Range Status   MRSA by PCR POSITIVE (A) NEGATIVE Final    Comment:  The  GeneXpert MRSA Assay (FDA approved for NASAL specimens only), is one component of a comprehensive MRSA colonization surveillance program. It is not intended to diagnose MRSA infection nor to guide or monitor treatment for MRSA infections. RESULT CALLED TO, READ BACK BY AND VERIFIED WITH: KEISHA @ 0998 ON 02/17/18 BY ROBINSON Z.   Culture, blood (routine x 2)     Status: None (Preliminary result)   Collection Time: 02/17/18  6:09 AM  Result Value Ref Range Status   Specimen Description BLOOD LEFT HAND  Final   Special Requests   Final    BOTTLES DRAWN AEROBIC ONLY Blood Culture results may not be optimal due to an inadequate volume of blood received in culture bottles   Culture   Final    NO GROWTH 4 DAYS Performed at [facility], [place] [address], [place], [place] [address]    Report Status PENDING  Incomplete         Radiology Studies: No results found.      Scheduled Meds: . calcium acetate  667 mg Oral TID WC  . carvedilol  6.25 mg Oral BID WC  . Chlorhexidine Gluconate Cloth  6 each Topical Daily  . dolutegravir  50 mg Oral Daily  . emtricitabine-tenofovir AF  1 tablet Oral Daily  . heparin injection (subcutaneous)  5,000 Units Subcutaneous Q8H  . mouth rinse  15 mL Mouth Rinse BID  . mupirocin ointment   Nasal BID  . pantoprazole (PROTONIX) IV  40 mg Intravenous QHS  . scopolamine  1 patch Transdermal Q72H  . sodium chloride flush  10-40 mL Intracatheter Q12H  . sodium chloride flush  3 mL Intravenous Q12H   Continuous Infusions: . sodium chloride Stopped (02/19/18 1100)  . sodium chloride    . sodium chloride    . sodium chloride       LOS: 5 days    Time spent: 40 minutes    Kalaysia Demonbreun, Geraldo Docker, MD [facility] Pager [phone]   If 7PM-7AM, please contact night-coverage [email or web address] Password [facility] 02/22/2018, 7:44 AM

## 2018-02-23 LAB — BASIC METABOLIC PANEL
ANION GAP: 17 — AB (ref 5–15)
BUN: 87 mg/dL — ABNORMAL HIGH (ref 6–20)
CALCIUM: 9.4 mg/dL (ref 8.9–10.3)
CHLORIDE: 96 mmol/L — AB (ref 101–111)
CO2: 22 mmol/L (ref 22–32)
Creatinine, Ser: 6.82 mg/dL — ABNORMAL HIGH (ref 0.61–1.24)
GFR calc Af Amer: 8 mL/min — ABNORMAL LOW (ref >60)
GFR calc non Af Amer: 7 mL/min — ABNORMAL LOW (ref >60)
GLUCOSE: 116 mg/dL — AB (ref 65–99)
Potassium: 3.8 mmol/L (ref 3.5–5.1)
Sodium: 135 mmol/L (ref 135–145)

## 2018-02-23 LAB — CBC
HEMATOCRIT: 37.8 % — AB (ref 39.0–52.0)
HEMOGLOBIN: 12.3 g/dL — AB (ref 13.0–17.0)
MCH: 32.3 pg (ref 26.0–34.0)
MCHC: 32.5 g/dL (ref 30.0–36.0)
MCV: 99.2 fL (ref 78.0–100.0)
Platelets: 207 10*3/uL (ref 150–400)
RBC: 3.81 MIL/uL — ABNORMAL LOW (ref 4.22–5.81)
RDW: 13.4 % (ref 11.5–15.5)
WBC: 8.9 10*3/uL (ref 4.0–10.5)

## 2018-02-23 LAB — TRIGLYCERIDES: TRIGLYCERIDES: 91 mg/dL (ref <150)

## 2018-02-23 LAB — HIV-1 RNA QUANT-NO REFLEX-BLD: LOG10 HIV-1 RNA: UNDETERMINED {Log_copies}/mL

## 2018-02-23 LAB — GLUCOSE, CAPILLARY
GLUCOSE-CAPILLARY: 129 mg/dL — AB (ref 65–99)
GLUCOSE-CAPILLARY: 135 mg/dL — AB (ref 65–99)
Glucose-Capillary: 99 mg/dL (ref 65–99)
Glucose-Capillary: 107 mg/dL — ABNORMAL HIGH (ref 65–99)

## 2018-02-23 LAB — MAGNESIUM: Magnesium: 2.5 mg/dL — ABNORMAL HIGH (ref 1.7–2.4)

## 2018-02-23 MED ORDER — SODIUM CHLORIDE 0.9 % IV SOLN: 100.0000 mL | INTRAVENOUS | Status: DC | PRN

## 2018-02-23 MED ORDER — PENTAFLUOROPROP-TETRAFLUOROETH EX AERO: 1.0000 "application " | INHALATION_SPRAY | CUTANEOUS | Status: DC | PRN

## 2018-02-23 MED ORDER — LIDOCAINE HCL (PF) 1 % IJ SOLN: 5.0000 mL | INTRAMUSCULAR | Status: DC | PRN

## 2018-02-23 MED ORDER — ALTEPLASE 2 MG IJ SOLR: 2.0000 mg | Freq: Once | INTRAMUSCULAR | Status: DC | PRN

## 2018-02-23 MED ORDER — LIDOCAINE-PRILOCAINE 2.5-2.5 % EX CREA: 1.0000 "application " | TOPICAL_CREAM | CUTANEOUS | Status: DC | PRN

## 2018-02-23 MED ORDER — HEPARIN SODIUM (PORCINE) 1000 UNIT/ML DIALYSIS: 1000.0000 [IU] | INTRAMUSCULAR | Status: DC | PRN

## 2018-02-23 NOTE — Progress Notes (Signed)
Received report from RN at pt bedside; Patient watching TV; A/Ox4; VSS; No complaint of pain; Will continue to monitor.

## 2018-02-23 NOTE — Progress Notes (Signed)
Patient ambulated 240 ft with walker.

## 2018-02-23 NOTE — Progress Notes (Addendum)
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 Wife reports that patient has had a dry cough last several days however, other than that has been in usual state on health. This morning around 2-3 am patient woke up with severe shortness of breath asking to be taken to ED. Upon arrival to ED patient was noted to be agonal and pulseless in parking lot. CPR started and continued for estimated 5-10 minutes before return of ROSC. Intubated. CXR consistent with pulmonary edema. Creatinine 5.32, LA 4.44. PCCM asked to admit.     Subjective: 3/23 A/O x4, negative CP, negative S OB, negative abdominal pain.  Positive fatigue secondary to being post HD.      Assessment & Plan:   Principal Problem:   HIV (human immunodeficiency virus infection) ([place]) Active Problems:   Respiratory arrest ([place])   Acute pulmonary edema (HCC)   Acute on chronic systolic heart failure (HCC)   Chronic obstructive pulmonary disease (HCC)   Cardiac arrest (HCC)   Acute on chronic systolic and diastolic heart failure, NYHA class 4 (HCC)   ESRD (end stage renal disease) ([place])   Acute respiratory failure with hypoxemia (HCC)  Acute respiratory failure with hypoxia -Most likely secondary to PEA, decompensated CHF. -Titrate O2 to maintain SPO2> 93% -Flutter valve -Ambulate every shift   HIV/AIDS -Continue antiretrovirals per ID -CD4 count pending/HIV RNA quant pending   PEA/Asystole Arrest ROSC 5-10 minutes/Nonischemic Cardiomyopathy/Chronic Diastolic CHF -CXR: Cardiomegaly perihilar opacities, pulmonary edema? -Cleared by cardiology for AV fistula placement, clipping    New ESRD -HD per nephrology -Vein mapping and repair/new AV  fistula per VVS -Per Dr. Ruta Hinds VVS appears they will place AV graft LEFT on next week  Anemia unspecified type -Anemia panel pending  Prostate Cancer s/p brachytherapy       DVT prophylaxis: Subcu heparin Code Status: Full Family Communication: Wife and family at bedside for discussion of plan of care Disposition Plan: TBD   Consultants:  Cardiology Nephrology    Procedures/Significant Events:  No results found. STUDIES:  CXR 3/17 > 1. Endotracheal tube 7.4 cm from the carina. Enteric tube in place with tip visualized in the stomach. 2. Cardiomegaly. Bilateral ill-defined perihilar opacities, favoring pulmonary edema. Multifocal pneumonia or aspiration could have a similar appearance. CT Chest 3/17 >> CT Head 3/17 >> ECHO 3/17 >>  ETT 3/17 >>3/19     I have personally reviewed and interpreted all radiology studies and my findings are as above.  VENTILATOR SETTINGS:    Cultures 3/17 blood NGTD 3/17 MRSA by PCR positive 3/17 respiratory virus panel negative 3/18 hepatitis B negative     Antimicrobials: Anti-infectives (From admission, onward)   Start     Stop   02/18/18 1330  ceFAZolin (ANCEF) IVPB 2g/100 mL premix     02/18/18 1409   02/17/18 1200  dolutegravir (TIVICAY) tablet 50 mg         02/17/18 1200  emtricitabine-tenofovir AF (DESCOVY) 200-25 MG per tablet 1 tablet             Devices    LINES / TUBES:  Right IJ tunneled HD cath>>>    Continuous Infusions: . sodium chloride Stopped (02/19/18 1100)  . sodium chloride    . [  START ON 02/25/2018]  ceFAZolin (ANCEF) IV       Objective: Vitals:   02/23/18 1322 02/23/18 1334 02/23/18 1500 02/23/18 1614  BP:  94/71  107/87  Pulse: (!) 113   95  Resp:   19 (!) 27  Temp:    98 F (36.7 C)  TempSrc:    Oral  SpO2: (!) 87%     Weight:      Height:        Intake/Output Summary (Last 24 hours) at 02/23/2018 2031 Last data filed at 02/23/2018 1110 Gross per 24 hour  Intake  -  Output 1400 ml  Net -1400 ml   Filed Weights   02/23/18 0346 02/23/18 0710 02/23/18 1110  Weight: 193 lb 2 oz (87.6 kg) 190 lb 14.7 oz (86.6 kg) 188 lb 4.4 oz (85.4 kg)    Physical Exam:  General: A/O x4, No acute respiratory distress Neck:  Negative scars, masses, torticollis, lymphadenopathy, JVD Lungs: Clear to auscultation bilaterally without wheezes or crackles Cardiovascular: Regular rate and rhythm without murmur gallop or rub normal S1 and S2 Abdomen: negative abdominal pain, nondistended, positive soft, bowel sounds, no rebound, no ascites, no appreciable mass Extremities: No significant cyanosis, clubbing, or edema bilateral lower extremities Skin: Negative rashes, lesions, ulcers Psychiatric:  Negative depression, negative anxiety, negative fatigue, negative mania  Central nervous system:  Cranial nerves II through XII intact, tongue/uvula midline, all extremities muscle strength 5/5, sensation intact throughout, negative dysarthria, negative expressive aphasia, negative receptive aphasia.   .     Data Reviewed: Care during the described time interval was provided by me .  I have reviewed this patient's available data, including medical history, events of note, physical examination, and all test results as part of my evaluation.   CBC: Recent Labs  Lab 02/17/18 0300  02/17/18 1020  02/19/18 0252 02/20/18 0305 02/21/18 0329 02/22/18 0336 02/23/18 0349  WBC 10.9*  --  9.7   < > 10.7* 10.8* 8.9 9.4 8.9  NEUTROABS 3.5  --  8.5*  --   --   --   --   --   --   HGB 13.5   < > 11.4*   < > 11.1* 10.7* 10.8* 11.9* 12.3*  HCT 41.5   < > 35.8*   < > 33.6* 33.1* 33.7* 36.3* 37.8*  MCV 103.0*  --  100.0   < > 100.6* 100.3* 100.6* 100.6* 99.2  PLT 256  --  234   < > 189 186 192 202 207   < > = values in this interval not displayed.   Basic Metabolic Panel: Recent Labs  Lab 02/18/18 0558 02/18/18 1840 02/19/18 0252 02/20/18 0305 02/21/18 0329 02/22/18 0336  02/23/18 0349  NA 136 139 139 139 142 138 135  K 3.1* 3.3* 3.4* 3.4* 3.5 4.0 3.8  CL 102 104 101 101 103 99* 96*  CO2 20* 24 24 24 26 23 22   GLUCOSE 147* 115* 116* 116* 128* 122* 116*  BUN 96* 102* 71* 78* 89* 54* 87*  CREATININE 5.46* 5.60* 4.34* 5.06* 5.05* 4.94* 6.82*  CALCIUM 8.2* 8.5* 8.6* 8.9 9.2 9.4 9.4  MG 2.1 2.3 2.2 2.2 2.4 2.3 2.5*  PHOS 3.8 3.9  3.8 3.7 6.0* 4.9*  --   --    GFR: Estimated Creatinine Clearance: 10.9 mL/min (A) (by C-G formula based on SCr of 6.82 mg/dL (H)). Liver Function Tests: Recent Labs  Lab 02/17/18 0300 02/18/18 1840 02/21/18 0329  AST 27  --   --  ALT 22  --   --   ALKPHOS 117  --   --   BILITOT 0.6  --   --   PROT 7.9  --   --   ALBUMIN 4.1 3.1* 3.2*   No results for input(s): LIPASE, AMYLASE in the last 168 hours. No results for input(s): AMMONIA in the last 168 hours. Coagulation Profile: Recent Labs  Lab 02/17/18 0300  INR 0.98   Cardiac Enzymes: Recent Labs  Lab 02/17/18 0455 02/17/18 1020 02/17/18 1633  TROPONINI 0.08* 0.07* 0.07*   BNP (last 3 results) No results for input(s): PROBNP in the last 8760 hours. HbA1C: No results for input(s): HGBA1C in the last 72 hours. CBG: Recent Labs  Lab 02/22/18 2350 02/23/18 0355 02/23/18 1133 02/23/18 1643 02/23/18 1952  GLUCAP 95 129* 99 135* 107*   Lipid Profile: Recent Labs    02/23/18 0349  TRIG 91   Thyroid Function Tests: No results for input(s): TSH, T4TOTAL, FREET4, T3FREE, THYROIDAB in the last 72 hours. Anemia Panel: Recent Labs    02/22/18 0752  VITAMINB12 815  FOLATE 17.2  FERRITIN 910*  TIBC 218*  IRON 27*  RETICCTPCT 1.1   Urine analysis:    Component Value Date/Time   COLORURINE YELLOW 02/17/2018 0540   APPEARANCEUR CLOUDY (A) 02/17/2018 0540   LABSPEC 1.010 02/17/2018 0540   PHURINE 5.0 02/17/2018 0540   GLUCOSEU NEGATIVE 02/17/2018 0540   HGBUR LARGE (A) 02/17/2018 0540   BILIRUBINUR NEGATIVE 02/17/2018 0540   KETONESUR NEGATIVE  02/17/2018 0540   PROTEINUR 100 (A) 02/17/2018 0540   NITRITE NEGATIVE 02/17/2018 0540   LEUKOCYTESUR NEGATIVE 02/17/2018 0540   Sepsis Labs: @LABRCNTIP (procalcitonin:4,lacticidven:4)  ) Recent Results (from the past 240 hour(s))  Culture, blood (routine x 2)     Status: None   Collection Time: 02/17/18  3:07 AM  Result Value Ref Range Status   Specimen Description BLOOD RIGHT HAND  Final   Special Requests IN PEDIATRIC BOTTLE Blood Culture adequate volume  Final   Culture   Final    NO GROWTH 5 DAYS Performed at [facility], [place] [address], [place], [place] [address]    Report Status 02/22/2018 FINAL  Final  Respiratory Panel by PCR     Status: None   Collection Time: 02/17/18  5:40 AM  Result Value Ref Range Status   Adenovirus NOT DETECTED NOT DETECTED Final   Coronavirus 229E NOT DETECTED NOT DETECTED Final   Coronavirus HKU1 NOT DETECTED NOT DETECTED Final   Coronavirus NL63 NOT DETECTED NOT DETECTED Final   Coronavirus OC43 NOT DETECTED NOT DETECTED Final   Metapneumovirus NOT DETECTED NOT DETECTED Final   Rhinovirus / Enterovirus NOT DETECTED NOT DETECTED Final   Influenza A NOT DETECTED NOT DETECTED Final   Influenza B NOT DETECTED NOT DETECTED Final   Parainfluenza Virus 1 NOT DETECTED NOT DETECTED Final   Parainfluenza Virus 2 NOT DETECTED NOT DETECTED Final   Parainfluenza Virus 3 NOT DETECTED NOT DETECTED Final   Parainfluenza Virus 4 NOT DETECTED NOT DETECTED Final   Respiratory Syncytial Virus NOT DETECTED NOT DETECTED Final   Bordetella pertussis NOT DETECTED NOT DETECTED Final   Chlamydophila pneumoniae NOT DETECTED NOT DETECTED Final   Mycoplasma pneumoniae NOT DETECTED NOT DETECTED Final    Comment: Performed at [facility], [place] [address], [place], [place] [address]  MRSA PCR Screening     Status: Abnormal   Collection Time: 02/17/18  5:40 AM  Result Value Ref  Range Status   MRSA by PCR POSITIVE (A) NEGATIVE Final    Comment:        The  GeneXpert MRSA Assay (FDA approved for NASAL specimens only), is one component of a comprehensive MRSA colonization surveillance program. It is not intended to diagnose MRSA infection nor to guide or monitor treatment for MRSA infections. RESULT CALLED TO, READ BACK BY AND VERIFIED WITH: KEISHA @ 3403 ON 02/17/18 BY ROBINSON Z.   Culture, blood (routine x 2)     Status: None   Collection Time: 02/17/18  6:09 AM  Result Value Ref Range Status   Specimen Description BLOOD LEFT HAND  Final   Special Requests   Final    BOTTLES DRAWN AEROBIC ONLY Blood Culture results may not be optimal due to an inadequate volume of blood received in culture bottles   Culture   Final    NO GROWTH 5 DAYS Performed at [facility], [place] [address] [place], [place] [address]    Report Status 02/22/2018 FINAL  Final         Radiology Studies: No results found.      Scheduled Meds: . carvedilol  6.25 mg Oral BID WC  . Chlorhexidine Gluconate Cloth  6 each Topical Daily  . dolutegravir  50 mg Oral Daily  . emtricitabine-tenofovir AF  1 tablet Oral Daily  . heparin injection (subcutaneous)  5,000 Units Subcutaneous Q8H  . mouth rinse  15 mL Mouth Rinse BID  . mupirocin ointment   Nasal BID  . pantoprazole (PROTONIX) IV  40 mg Intravenous QHS  . scopolamine  1 patch Transdermal Q72H  . sodium chloride flush  10-40 mL Intracatheter Q12H  . sodium chloride flush  3 mL Intravenous Q12H   Continuous Infusions: . sodium chloride Stopped (02/19/18 1100)  . sodium chloride    . [START ON 02/25/2018]  ceFAZolin (ANCEF) IV       LOS: 6 days    Time spent: 40 minutes    Jeneal Vogl, Geraldo Docker, MD [facility] Pager [phone]   If 7PM-7AM, please contact night-coverage [email or web address] Password [name] 02/23/2018, 8:31 PM

## 2018-02-23 NOTE — Procedures (Signed)
Signed           [] Hide copied text  [] Hover for details Stable on HD, volume status improved, no instability    Assessment/Plan:77 year old BM with controlled HIV, EF 20-25 % NICM, as well as stage 5 CKD. This is third hosp in 3 mos for pulmonary edema/pulmonary issues   1.New ESRD, s/p [facility] 3/18 VVS has seen and plans for possible AVVG next week.  CLiP pending 2. Hypertension/volume- BPs better 3 VDRF s/p extubation 4.Bones-PTH133 5. Hypokalemia 6. NICM EF 20-24%

## 2018-02-23 NOTE — Plan of Care (Signed)
  Problem: Health Behavior/Discharge Planning: Goal: Ability to manage health-related needs will improve Outcome: Progressing   Problem: Clinical Measurements: Goal: Ability to maintain clinical measurements within normal limits will improve Outcome: Progressing Goal: Will remain free from infection Outcome: Progressing Goal: Diagnostic test results will improve Outcome: Progressing Goal: Respiratory complications will improve Outcome: Progressing Goal: Cardiovascular complication will be avoided Outcome: Progressing   Problem: Activity: Goal: Risk for activity intolerance will decrease Outcome: Progressing   Problem: Nutrition: Goal: Adequate nutrition will be maintained Outcome: Progressing   Problem: Elimination: Goal: Will not experience complications related to bowel motility Outcome: Progressing Goal: Will not experience complications related to urinary retention Outcome: Progressing   Problem: Safety: Goal: Ability to remain free from injury will improve Outcome: Progressing   Problem: Skin Integrity: Goal: Risk for impaired skin integrity will decrease Outcome: Progressing   Problem: Activity: Goal: Ability to tolerate increased activity will improve Outcome: Progressing   Problem: Education: Goal: Knowledge of disease and its progression will improve Outcome: Progressing   Problem: Coping: Goal: Development of coping mechanisms to deal with changes in body function or appearance will improve Outcome: Progressing   Problem: Fluid Volume: Goal: Compliance with measures to maintain balanced fluid volume will improve Outcome: Progressing   Problem: Health Behavior/Discharge Planning: Goal: Ability to manage health-related needs will improve Outcome: Progressing   Problem: Nutritional: Goal: Ability to make healthy dietary choices will improve Outcome: Progressing   Problem: Physical Regulation: Goal: Complications related to the disease process,  condition or treatment will be avoided or minimized Outcome: Progressing

## 2018-02-24 LAB — BASIC METABOLIC PANEL
Anion gap: 18 — ABNORMAL HIGH (ref 5–15)
BUN: 55 mg/dL — AB (ref 6–20)
CALCIUM: 9.6 mg/dL (ref 8.9–10.3)
CO2: 22 mmol/L (ref 22–32)
CREATININE: 6.59 mg/dL — AB (ref 0.61–1.24)
Chloride: 97 mmol/L — ABNORMAL LOW (ref 101–111)
GFR calc Af Amer: 8 mL/min — ABNORMAL LOW (ref >60)
GFR, EST NON AFRICAN AMERICAN: 7 mL/min — AB (ref >60)
GLUCOSE: 114 mg/dL — AB (ref 65–99)
Potassium: 3.8 mmol/L (ref 3.5–5.1)
SODIUM: 137 mmol/L (ref 135–145)

## 2018-02-24 LAB — CBC
HCT: 43 % (ref 39.0–52.0)
HEMOGLOBIN: 13.8 g/dL (ref 13.0–17.0)
MCH: 32.2 pg (ref 26.0–34.0)
MCHC: 32.1 g/dL (ref 30.0–36.0)
MCV: 100.5 fL — AB (ref 78.0–100.0)
Platelets: 212 10*3/uL (ref 150–400)
RBC: 4.28 MIL/uL (ref 4.22–5.81)
RDW: 13.6 % (ref 11.5–15.5)
WBC: 11.3 10*3/uL — AB (ref 4.0–10.5)

## 2018-02-24 LAB — GLUCOSE, CAPILLARY
GLUCOSE-CAPILLARY: 120 mg/dL — AB (ref 65–99)
GLUCOSE-CAPILLARY: 144 mg/dL — AB (ref 65–99)
GLUCOSE-CAPILLARY: 151 mg/dL — AB (ref 65–99)
Glucose-Capillary: 114 mg/dL — ABNORMAL HIGH (ref 65–99)
Glucose-Capillary: 115 mg/dL — ABNORMAL HIGH (ref 65–99)
Glucose-Capillary: 120 mg/dL — ABNORMAL HIGH (ref 65–99)

## 2018-02-24 LAB — MAGNESIUM: MAGNESIUM: 2.4 mg/dL (ref 1.7–2.4)

## 2018-02-24 NOTE — Evaluation (Signed)
Occupational Therapy Evaluation and Discharge Patient Details Name: David [name] MRN: [id number] DOB: June 20, 1941 Today's Date: 02/24/2018    History of Present Illness Pt is a 77 y.o. male admitted 02/17/18 with severe SOB and PEA arrest upon arrival requiring CPR before return to [place]; intubated 3/17-3/19. CXR consistent with pulmonary edema. CXR 3/18 shows bilateral pnuemonia. R IJ tunneled HD catheter placed 3/18 to initiate HD. 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    Clinical Impression   PTA Pt independent in ADL and transfers. Pt at baseline, able to demonstrate LB dressing, toilet transfer, peri care, sink level grooming at mod I level with RW. Pt will also have 24 hour support at home from wife and children. Education complete. No questions or concerns for OT at the end of the session, OT to sign off at this time. Thank you for the opportunity to serve this patient.     Follow Up Recommendations       Equipment Recommendations  None recommended by OT    Recommendations for Other Services       Precautions / Restrictions Precautions Precautions: Fall Restrictions Weight Bearing Restrictions: No      Mobility Bed Mobility Overal bed mobility: Modified Independent             General bed mobility comments: Increased time and effort, but no physical assist required  Transfers Overall transfer level: Needs assistance Equipment used: None Transfers: Sit to/from Stand Sit to Stand: Supervision         General transfer comment: supervision for safety, use of rail from bed    Balance Overall balance assessment: Needs assistance Sitting-balance support: No upper extremity supported;Feet supported Sitting balance-Leahy Scale: Good Sitting balance - Comments: Able to perform LB dressing sitting EOB   Standing balance support: Bilateral  upper extremity supported;During functional activity Standing balance-Leahy Scale: Poor Standing balance comment: Reliant on UE support                           ADL either performed or assessed with clinical judgement   ADL Overall ADL's : At baseline                                       General ADL Comments: Pt able to don/doff socks, transfer to toilet with RW, perform peri care, sink level grooming without assist from therapist     Vision Patient Visual Report: No change from baseline       Perception     Praxis      Pertinent Vitals/Pain Pain Assessment: No/denies pain Pain Intervention(s): Monitored during session     Hand Dominance     Extremity/Trunk Assessment Upper Extremity Assessment Upper Extremity Assessment: Overall WFL for tasks assessed   Lower Extremity Assessment Lower Extremity Assessment: Defer to PT evaluation       Communication Communication Communication: No difficulties   Cognition Arousal/Alertness: Awake/alert Behavior During Therapy: Flat affect Overall Cognitive Status: Within Functional Limits for tasks assessed                                     General Comments       Exercises     Shoulder  Instructions      Home Living Family/patient expects to be discharged to:: Private residence Living Arrangements: Spouse/significant other;Children Available Help at Discharge: Family;Available 24 hours/day Type of Home: House Home Access: Stairs to enter [employer] of Steps: 3 Entrance Stairs-Rails: Right Home Layout: One level     Bathroom Shower/Tub: Occupational psychologist: Standard     Home Equipment: Environmental consultant - 2 wheels;Cane - single point;Shower seat          Prior Functioning/Environment Level of Independence: Independent with assistive device(s)        Comments: Mod indep with SPC. Drives. 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)        OT  Problem List:        OT Treatment/Interventions:      OT Goals(Current goals can be found in the care plan section) Acute Rehab OT Goals Patient Stated Goal: Return home OT Goal Formulation: With patient Time For Goal Achievement: 03/10/18 Potential to Achieve Goals: Good  OT Frequency:     Barriers to D/C:            Co-evaluation              AM-PAC PT "6 Clicks" Daily Activity     Outcome Measure Help from another person eating meals?: None Help from another person taking care of personal grooming?: None Help from another person toileting, which includes using toliet, bedpan, or urinal?: None(with RW) Help from another person bathing (including washing, rinsing, drying)?: None Help from another person to put on and taking off regular upper body clothing?: None Help from another person to put on and taking off regular lower body clothing?: None 6 Click Score: 24   End of Session Equipment Utilized During Treatment: Gait belt;Rolling walker Nurse Communication: Mobility status  Activity Tolerance: Patient tolerated treatment well Patient left: in bed;with call bell/phone within reach                   Time: 7482-7078 OT Time Calculation (min): 15 min Charges:  OT General Charges $OT Visit: 1 Visit OT Evaluation $OT Eval Moderate Complexity: 1 Mod G-Codes:     Hulda Humphrey OTR/L [phone] Merri Ray Normon Pettijohn 02/24/2018, 4:13 PM

## 2018-02-24 NOTE — Progress Notes (Signed)
Report given to RN on 51M

## 2018-02-24 NOTE — Progress Notes (Signed)
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  In ED patient was noted to be agonal and pulseless in parking lot. CPR started and continued for estimated 5-10 minutes before return of ROSC. Intubated. CXR consistent with pulmonary edema. Creatinine 5.32, LA 4.44. PCCM asked to admit.   He has been started on dialysis and is undergoing the CLIP process   He appears to be doing well today and informs me that he is going to have a fistula placed Monday 3/25      Objective:  Vital signs in last 24 hours:  Temp:  [97.4 F (36.3 C)-98 F (36.7 C)] 97.8 F (36.6 C) (03/24 1200) Pulse Rate:  [86-113] 86 (03/24 1200) Resp:  [18-27] 18 (03/24 1200) BP: (92-107)/(65-89) 105/75 (03/24 1200) SpO2:  [87 %-96 %] 93 % (03/24 1200) Weight:  [183 lb 13.8 oz (83.4 kg)] 183 lb 13.8 oz (83.4 kg) (03/24 0409)  Weight change: -2 lb 3.3 oz (-1 kg) Filed Weights   02/23/18 0710 02/23/18 1110 02/24/18 0409  Weight: 190 lb 14.7 oz (86.6 kg) 188 lb 4.4 oz (85.4 kg) 183 lb 13.8 oz (83.4 kg)    Intake/Output: I/O last 3 completed shifts: In: 660 [P.O.:660] Out: 1400 [Other:1400]   Intake/Output this shift:  Total I/O In: 360 [P.O.:360] Out: -    General appearance: alert, no distress and sitting up in a chair Neck: no carotid bruit, no JVD and thyroid not enlarged, symmetric, no tenderness/mass/nodules Lungs: clear to auscultation bilaterally Heart: regular rate and rhythm and no murmur Abdomen: soft, non-tender; bowel sounds normal; no masses,  no organomegaly Extremities: extremities normal, atraumatic, no cyanosis or edema Pulses: 2+ and symmetric Skin: Skin color, texture, turgor normal. No rashes or lesions Neurologic: Mental status: Alert, oriented, thought content appropriate Psych: flat  affect    Basic Metabolic Panel: Recent Labs  Lab 02/18/18 0558 02/18/18 1840 02/19/18 0252 02/20/18 0305 02/21/18 0329 02/22/18 0336 02/23/18 0349 02/24/18 0348  NA 136 139 139 139 142 138 135 137  K 3.1* 3.3* 3.4* 3.4* 3.5 4.0 3.8 3.8  CL 102 104 101 101 103 99* 96* 97*  CO2 20* 24 24 24 26 23 22 22   GLUCOSE 147* 115* 116* 116* 128* 122* 116* 114*  BUN 96* 102* 71* 78* 89* 54* 87* 55*  CREATININE 5.46* 5.60* 4.34* 5.06* 5.05* 4.94* 6.82* 6.59*  CALCIUM 8.2* 8.5* 8.6* 8.9 9.2 9.4 9.4 9.6  MG 2.1 2.3 2.2 2.2 2.4 2.3 2.5* 2.4  PHOS 3.8 3.9  3.8 3.7 6.0* 4.9*  --   --   --     Liver Function Tests: Recent Labs  Lab 02/18/18 1840 02/21/18 0329  ALBUMIN 3.1* 3.2*   No results for input(s): LIPASE, AMYLASE in the last 168 hours. No results for input(s): AMMONIA in the last 168 hours.  CBC: Recent Labs  Lab 02/20/18 0305 02/21/18 0329 02/22/18 0336 02/23/18 0349 02/24/18 0820  WBC 10.8* 8.9 9.4 8.9 11.3*  HGB 10.7* 10.8* 11.9* 12.3* 13.8  HCT 33.1* 33.7* 36.3* 37.8* 43.0  MCV 100.3* 100.6* 100.6* 99.2 100.5*  PLT 186 192 202 207 212    Cardiac Enzymes: Recent Labs  Lab 02/17/18 1633  TROPONINI 0.07*    BNP: Invalid input(s): POCBNP  CBG: Recent Labs  Lab 02/23/18 1952 02/24/18 0006 02/24/18 0405 02/24/18 0757 02/24/18  [place]    Microbiology: Results for orders placed or performed during the hospital encounter of 02/17/18  Culture, blood (routine x 2)     Status: None   Collection Time: 02/17/18  3:07 AM  Result Value Ref Range Status   Specimen Description BLOOD RIGHT HAND  Final   Special Requests IN PEDIATRIC BOTTLE Blood Culture adequate volume  Final   Culture   Final    NO GROWTH 5 DAYS Performed at [facility], [address] [place], [place] [address]    Report Status 02/22/2018 FINAL  Final  Respiratory Panel by PCR     Status: None   Collection Time: 02/17/18  5:40 AM  Result Value Ref  Range Status   Adenovirus NOT DETECTED NOT DETECTED Final   Coronavirus 229E NOT DETECTED NOT DETECTED Final   Coronavirus HKU1 NOT DETECTED NOT DETECTED Final   Coronavirus NL63 NOT DETECTED NOT DETECTED Final   Coronavirus OC43 NOT DETECTED NOT DETECTED Final   Metapneumovirus NOT DETECTED NOT DETECTED Final   Rhinovirus / Enterovirus NOT DETECTED NOT DETECTED Final   Influenza A NOT DETECTED NOT DETECTED Final   Influenza B NOT DETECTED NOT DETECTED Final   Parainfluenza Virus 1 NOT DETECTED NOT DETECTED Final   Parainfluenza Virus 2 NOT DETECTED NOT DETECTED Final   Parainfluenza Virus 3 NOT DETECTED NOT DETECTED Final   Parainfluenza Virus 4 NOT DETECTED NOT DETECTED Final   Respiratory Syncytial Virus NOT DETECTED NOT DETECTED Final   Bordetella pertussis NOT DETECTED NOT DETECTED Final   Chlamydophila pneumoniae NOT DETECTED NOT DETECTED Final   Mycoplasma pneumoniae NOT DETECTED NOT DETECTED Final    Comment: Performed at [facility], [place] [address], [place], [place] [address]  MRSA PCR Screening     Status: Abnormal   Collection Time: 02/17/18  5:40 AM  Result Value Ref Range Status   MRSA by PCR POSITIVE (A) NEGATIVE Final    Comment:        The GeneXpert MRSA Assay (FDA approved for NASAL specimens only), is one component of a comprehensive MRSA colonization surveillance program. It is not intended to diagnose MRSA infection nor to guide or monitor treatment for MRSA infections. RESULT CALLED TO, READ BACK BY AND VERIFIED WITH: KEISHA @ 9528 ON 02/17/18 BY ROBINSON Z.   Culture, blood (routine x 2)     Status: None   Collection Time: 02/17/18  6:09 AM  Result Value Ref Range Status   Specimen Description BLOOD LEFT HAND  Final   Special Requests   Final    BOTTLES DRAWN AEROBIC ONLY Blood Culture results may not be optimal due to an inadequate volume of blood received in culture bottles   Culture   Final    NO GROWTH 5 DAYS Performed at [facility], [place] [address], [employer], [place] [address]    Report Status 02/22/2018 FINAL  Final    Coagulation Studies: No results for input(s): LABPROT, INR in the last 72 hours.  Urinalysis: No results for input(s): COLORURINE, LABSPEC, PHURINE, GLUCOSEU, HGBUR, BILIRUBINUR, KETONESUR, PROTEINUR, UROBILINOGEN, NITRITE, LEUKOCYTESUR in the last 72 hours.  Invalid input(s): APPERANCEUR    Imaging: No results found.   Medications:   . sodium chloride Stopped (02/19/18 1100)  . sodium chloride    . [START ON 02/25/2018]  ceFAZolin (ANCEF) IV     . carvedilol  6.25 mg Oral BID WC  . Chlorhexidine Gluconate  Cloth  6 each Topical Daily  . dolutegravir  50 mg Oral Daily  . emtricitabine-tenofovir AF  1 tablet Oral Daily  . heparin injection (subcutaneous)  5,000 Units Subcutaneous Q8H  . mouth rinse  15 mL Mouth Rinse BID  . mupirocin ointment   Nasal BID  . pantoprazole (PROTONIX) IV  40 mg Intravenous QHS  . scopolamine  1 patch Transdermal Q72H  . sodium chloride flush  10-40 mL Intracatheter Q12H  . sodium chloride flush  3 mL Intravenous Q12H   sodium chloride, sodium chloride, acetaminophen, midazolam, midazolam, ondansetron (ZOFRAN) IV, sodium chloride flush, sodium chloride flush  Assessment/ Plan:   ESRD- new ESRD  Undergoing CLIP process  Possible AVG next week  ANEMIA- Hb 11.9   MBD- difficulty tolerating phoslo  Will stop at this point  Phos in 4 range we will follow  HTN/VOL-  Controlled   ACCESS-  For AVF  3/25  possibly  OTHER- EF 20 %   HIV   antiretrovirals    LOS: 7 David [name] @TODAY @1 :06 PM

## 2018-02-24 NOTE — Progress Notes (Signed)
Pt ambulated 470 feet with the assistance of a rolling walker. Pt tolerated activity well and is now resting comfortably in bed.   David [name] M

## 2018-02-24 NOTE — Progress Notes (Signed)
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 Wife reports that patient has had a dry cough last several days however, other than that has been in usual state on health. This morning around 2-3 am patient woke up with severe shortness of breath asking to be taken to ED. Upon arrival to ED patient was noted to be agonal and pulseless in parking lot. CPR started and continued for estimated 5-10 minutes before return of ROSC. Intubated. CXR consistent with pulmonary edema. Creatinine 5.32, LA 4.44. PCCM asked to admit.     Subjective: 3/24 A/O x4, negative CP, negative S OB, negative abdominal pain.   Assessment & Plan:   Principal Problem:   HIV (human immunodeficiency virus infection) ([place]) Active Problems:   Respiratory arrest ([place])   Acute pulmonary edema (HCC)   Acute on chronic systolic heart failure (HCC)   Chronic obstructive pulmonary disease (HCC)   Cardiac arrest (HCC)   Acute on chronic systolic and diastolic heart failure, NYHA class 4 (HCC)   ESRD (end stage renal disease) ([place])   Acute respiratory failure with hypoxemia (HCC)  Acute respiratory failure with hypoxia -Most likely secondary to PEA, decompensated CHF. -Titrate O2 to maintain SPO2> 93% -Flutter valve -Ambulate every shift   HIV/AIDS -Continue antiretrovirals per ID -CD4 count pending/HIV RNA quant pending   PEA/Asystole Arrest ROSC 5-10 minutes/Nonischemic Cardiomyopathy/Chronic Diastolic CHF -CXR: Cardiomegaly perihilar opacities, pulmonary edema? -Cleared by cardiology for AV fistula placement, clipping    New ESRD -HD per nephrology -Vein mapping and repair/new AV fistula per VVS -Per Dr. Ruta Hinds [facility]  appears they will place AV graft LEFT on next week  Anemia unspecified type -Anemia panel pending  Prostate Cancer s/p brachytherapy       DVT prophylaxis: Subcu heparin Code Status: Full Family Communication: None Disposition Plan: TBD   Consultants:  Cardiology Nephrology    Procedures/Significant Events:  No results found. STUDIES:  CXR 3/17 > 1. Endotracheal tube 7.4 cm from the carina. Enteric tube in place with tip visualized in the stomach. 2. Cardiomegaly. Bilateral ill-defined perihilar opacities, favoring pulmonary edema. Multifocal pneumonia or aspiration could have a similar appearance. CT Chest 3/17 >> CT Head 3/17 >> ECHO 3/17 >>  ETT 3/17 >>3/19     I have personally reviewed and interpreted all radiology studies and my findings are as above.  VENTILATOR SETTINGS:    Cultures 3/17 blood NGTD 3/17 MRSA by PCR positive 3/17 respiratory virus panel negative 3/18 hepatitis B negative     Antimicrobials: Anti-infectives (From admission, onward)   Start     Stop   02/18/18 1330  ceFAZolin (ANCEF) IVPB 2g/100 mL premix     02/18/18 1409   02/17/18 1200  dolutegravir (TIVICAY) tablet 50 mg         02/17/18 1200  emtricitabine-tenofovir AF (DESCOVY) 200-25 MG per tablet 1 tablet             Devices    LINES / TUBES:  Right IJ tunneled HD cath>>>    Continuous Infusions: . sodium chloride Stopped (02/19/18 1100)  . sodium chloride    . [START ON 02/25/2018]  ceFAZolin (ANCEF) IV       Objective: Vitals:   02/23/18  1955 02/24/18 0000 02/24/18 0409 02/24/18 0755  BP: 100/76 104/73 92/65 102/89  Pulse:   87 99  Resp: (!) 23 18 20 20   Temp: 97.6 F (36.4 C) (!) 97.4 F (36.3 C) 97.8 F (36.6 C) (!) 97.4 F (36.3 C)  TempSrc: Oral Oral Axillary Oral  SpO2:  96% 95% 96%  Weight:   183 lb 13.8 oz (83.4 kg)   Height:        Intake/Output Summary (Last 24 hours) at 02/24/2018 0843 Last data filed at 02/24/2018 0000 Gross per  24 hour  Intake 440 ml  Output 1400 ml  Net -960 ml   Filed Weights   02/23/18 0710 02/23/18 1110 02/24/18 0409  Weight: 190 lb 14.7 oz (86.6 kg) 188 lb 4.4 oz (85.4 kg) 183 lb 13.8 oz (83.4 kg)    Physical Exam:  General: A/O x4, No acute respiratory distress Neck:  Negative scars, masses, torticollis, lymphadenopathy, JVD Lungs: Clear to auscultation bilaterally without wheezes or crackles Cardiovascular: Regular rate and rhythm without murmur gallop or rub normal S1 and S2 Abdomen: negative abdominal pain, nondistended, positive soft, bowel sounds, no rebound, no ascites, no appreciable mass Extremities: No significant cyanosis, clubbing, or edema bilateral lower extremities Skin: Negative rashes, lesions, ulcers Psychiatric:  Negative depression, negative anxiety, negative fatigue, negative mania  Central nervous system:  Cranial nerves II through XII intact, tongue/uvula midline, all extremities muscle strength 5/5, sensation intact throughout, negative dysarthria, negative expressive aphasia, negative receptive aphasia.   .     Data Reviewed: Care during the described time interval was provided by me .  I have reviewed this patient's available data, including medical history, events of note, physical examination, and all test results as part of my evaluation.   CBC: Recent Labs  Lab 02/17/18 1020  02/19/18 0252 02/20/18 0305 02/21/18 0329 02/22/18 0336 02/23/18 0349  WBC 9.7   < > 10.7* 10.8* 8.9 9.4 8.9  NEUTROABS 8.5*  --   --   --   --   --   --   HGB 11.4*   < > 11.1* 10.7* 10.8* 11.9* 12.3*  HCT 35.8*   < > 33.6* 33.1* 33.7* 36.3* 37.8*  MCV 100.0   < > 100.6* 100.3* 100.6* 100.6* 99.2  PLT 234   < > 189 186 192 202 207   < > = values in this interval not displayed.   Basic Metabolic Panel: Recent Labs  Lab 02/18/18 0558 02/18/18 1840 02/19/18 0252 02/20/18 0305 02/21/18 0329 02/22/18 0336 02/23/18 0349 02/24/18 0348  NA 136 139 139 139 142 138 135  137  K 3.1* 3.3* 3.4* 3.4* 3.5 4.0 3.8 3.8  CL 102 104 101 101 103 99* 96* 97*  CO2 20* 24 24 24 26 23 22 22   GLUCOSE 147* 115* 116* 116* 128* 122* 116* 114*  BUN 96* 102* 71* 78* 89* 54* 87* 55*  CREATININE 5.46* 5.60* 4.34* 5.06* 5.05* 4.94* 6.82* 6.59*  CALCIUM 8.2* 8.5* 8.6* 8.9 9.2 9.4 9.4 9.6  MG 2.1 2.3 2.2 2.2 2.4 2.3 2.5* 2.4  PHOS 3.8 3.9  3.8 3.7 6.0* 4.9*  --   --   --    GFR: Estimated Creatinine Clearance: 11.3 mL/min (A) (by C-G formula based on SCr of 6.59 mg/dL (H)). Liver Function Tests: Recent Labs  Lab 02/18/18 1840 02/21/18 0329  ALBUMIN 3.1* 3.2*   No results for input(s): LIPASE, AMYLASE in the last 168 hours. No results for input(s): AMMONIA in the  last 168 hours. Coagulation Profile: No results for input(s): INR, PROTIME in the last 168 hours. Cardiac Enzymes: Recent Labs  Lab 02/17/18 1020 02/17/18 1633  TROPONINI 0.07* 0.07*   BNP (last 3 results) No results for input(s): PROBNP in the last 8760 hours. HbA1C: No results for input(s): HGBA1C in the last 72 hours. CBG: Recent Labs  Lab 02/23/18 1643 02/23/18 1952 02/24/18 0006 02/24/18 0405 02/24/18 0757  GLUCAP 135* 107* 120* 115* 114*   Lipid Profile: Recent Labs    02/23/18 0349  TRIG 91   Thyroid Function Tests: No results for input(s): TSH, T4TOTAL, FREET4, T3FREE, THYROIDAB in the last 72 hours. Anemia Panel: Recent Labs    02/22/18 0752  VITAMINB12 815  FOLATE 17.2  FERRITIN 910*  TIBC 218*  IRON 27*  RETICCTPCT 1.1   Urine analysis:    Component Value Date/Time   COLORURINE YELLOW 02/17/2018 0540   APPEARANCEUR CLOUDY (A) 02/17/2018 0540   LABSPEC 1.010 02/17/2018 0540   PHURINE 5.0 02/17/2018 0540   GLUCOSEU NEGATIVE 02/17/2018 0540   HGBUR LARGE (A) 02/17/2018 0540   BILIRUBINUR NEGATIVE 02/17/2018 0540   KETONESUR NEGATIVE 02/17/2018 0540   PROTEINUR 100 (A) 02/17/2018 0540   NITRITE NEGATIVE 02/17/2018 0540   LEUKOCYTESUR NEGATIVE 02/17/2018 0540    Sepsis Labs: @LABRCNTIP (procalcitonin:4,lacticidven:4)  ) Recent Results (from the past 240 hour(s))  Culture, blood (routine x 2)     Status: None   Collection Time: 02/17/18  3:07 AM  Result Value Ref Range Status   Specimen Description BLOOD RIGHT HAND  Final   Special Requests IN PEDIATRIC BOTTLE Blood Culture adequate volume  Final   Culture   Final    NO GROWTH 5 DAYS Performed at [facility], [place] [address], [place], [place] [address]    Report Status 02/22/2018 FINAL  Final  Respiratory Panel by PCR     Status: None   Collection Time: 02/17/18  5:40 AM  Result Value Ref Range Status   Adenovirus NOT DETECTED NOT DETECTED Final   Coronavirus 229E NOT DETECTED NOT DETECTED Final   Coronavirus HKU1 NOT DETECTED NOT DETECTED Final   Coronavirus NL63 NOT DETECTED NOT DETECTED Final   Coronavirus OC43 NOT DETECTED NOT DETECTED Final   Metapneumovirus NOT DETECTED NOT DETECTED Final   Rhinovirus / Enterovirus NOT DETECTED NOT DETECTED Final   Influenza A NOT DETECTED NOT DETECTED Final   Influenza B NOT DETECTED NOT DETECTED Final   Parainfluenza Virus 1 NOT DETECTED NOT DETECTED Final   Parainfluenza Virus 2 NOT DETECTED NOT DETECTED Final   Parainfluenza Virus 3 NOT DETECTED NOT DETECTED Final   Parainfluenza Virus 4 NOT DETECTED NOT DETECTED Final   Respiratory Syncytial Virus NOT DETECTED NOT DETECTED Final   Bordetella pertussis NOT DETECTED NOT DETECTED Final   Chlamydophila pneumoniae NOT DETECTED NOT DETECTED Final   Mycoplasma pneumoniae NOT DETECTED NOT DETECTED Final    Comment: Performed at [facility], [place] [address], [place], [place] [address]  MRSA PCR Screening     Status: Abnormal   Collection Time: 02/17/18  5:40 AM  Result Value Ref Range Status   MRSA by PCR POSITIVE (A) NEGATIVE Final    Comment:        The GeneXpert MRSA Assay (FDA approved for NASAL specimens only), is one component of a comprehensive MRSA  colonization surveillance program. It is not intended to diagnose MRSA infection nor to guide or monitor treatment for MRSA infections. RESULT CALLED TO,  READ BACK BY AND VERIFIED WITH: KEISHA @ [phone] ON 02/17/18 BY ROBINSON Z.   Culture, blood (routine x 2)     Status: None   Collection Time: 02/17/18  6:09 AM  Result Value Ref Range Status   Specimen Description BLOOD LEFT HAND  Final   Special Requests   Final    BOTTLES DRAWN AEROBIC ONLY Blood Culture results may not be optimal due to an inadequate volume of blood received in culture bottles   Culture   Final    NO GROWTH 5 DAYS Performed at [facility], [place] [address], [place], [place] [address]    Report Status 02/22/2018 FINAL  Final         Radiology Studies: No results found.      Scheduled Meds: . carvedilol  6.25 mg Oral BID WC  . Chlorhexidine Gluconate Cloth  6 each Topical Daily  . dolutegravir  50 mg Oral Daily  . emtricitabine-tenofovir AF  1 tablet Oral Daily  . heparin injection (subcutaneous)  5,000 Units Subcutaneous Q8H  . mouth rinse  15 mL Mouth Rinse BID  . mupirocin ointment   Nasal BID  . pantoprazole (PROTONIX) IV  40 mg Intravenous QHS  . scopolamine  1 patch Transdermal Q72H  . sodium chloride flush  10-40 mL Intracatheter Q12H  . sodium chloride flush  3 mL Intravenous Q12H   Continuous Infusions: . sodium chloride Stopped (02/19/18 1100)  . sodium chloride    . [START ON 02/25/2018]  ceFAZolin (ANCEF) IV       LOS: 7 days    Time spent: 40 minutes    Fallyn Munnerlyn, Geraldo Docker, MD [facility] Pager [phone]   If 7PM-7AM, please contact night-coverage [email or web address] Password [facility] 02/24/2018, 8:43 AM

## 2018-02-24 NOTE — Progress Notes (Signed)
Received report from RN; Patient watching TV; A/Ox4; No complaint of pain; VSS; Requested to walk after the game; Will continue to monitor.

## 2018-02-25 ENCOUNTER — Encounter (HOSPITAL_COMMUNITY)
Admission: EM | Disposition: A | Discharge status: Home / Self Care | Payer: Self-pay | Source: Home / Self Care | Attending: Internal Medicine

## 2018-02-25 ENCOUNTER — Inpatient Hospital Stay (HOSPITAL_COMMUNITY): Payer: Medicare HMO | Admitting: Anesthesiology

## 2018-02-25 ENCOUNTER — Encounter (HOSPITAL_COMMUNITY): Payer: Self-pay | Admitting: Anesthesiology

## 2018-02-25 DIAGNOSIS — N185 Chronic kidney disease, stage 5: Secondary | ICD-10-CM

## 2018-02-25 HISTORY — PX: AV FISTULA PLACEMENT: SHX1204

## 2018-02-25 LAB — CBC
HCT: 37.3 % — ABNORMAL LOW (ref 39.0–52.0)
Hemoglobin: 12.3 g/dL — ABNORMAL LOW (ref 13.0–17.0)
MCH: 32.2 pg (ref 26.0–34.0)
MCHC: 33 g/dL (ref 30.0–36.0)
MCV: 97.6 fL (ref 78.0–100.0)
Platelets: 210 10*3/uL (ref 150–400)
RBC: 3.82 MIL/uL — ABNORMAL LOW (ref 4.22–5.81)
RDW: 13.2 % (ref 11.5–15.5)
WBC: 10 10*3/uL (ref 4.0–10.5)

## 2018-02-25 LAB — POCT I-STAT 4, (NA,K, GLUC, HGB,HCT)
GLUCOSE: 108 mg/dL — AB (ref 65–99)
HEMATOCRIT: 37 % — AB (ref 39.0–52.0)
Hemoglobin: 12.6 g/dL — ABNORMAL LOW (ref 13.0–17.0)
Potassium: 3.7 mmol/L (ref 3.5–5.1)
SODIUM: 136 mmol/L (ref 135–145)

## 2018-02-25 LAB — PROTIME-INR
INR: 1.05
PROTHROMBIN TIME: 13.6 s (ref 11.4–15.2)

## 2018-02-25 LAB — GLUCOSE, CAPILLARY
Glucose-Capillary: 106 mg/dL — ABNORMAL HIGH (ref 65–99)
Glucose-Capillary: 113 mg/dL — ABNORMAL HIGH (ref 65–99)
Glucose-Capillary: 119 mg/dL — ABNORMAL HIGH (ref 65–99)
Glucose-Capillary: 160 mg/dL — ABNORMAL HIGH (ref 65–99)
Glucose-Capillary: 178 mg/dL — ABNORMAL HIGH (ref 65–99)
Glucose-Capillary: 99 mg/dL (ref 65–99)

## 2018-02-25 LAB — MAGNESIUM: MAGNESIUM: 2.5 mg/dL — AB (ref 1.7–2.4)

## 2018-02-25 LAB — T-HELPER CELLS (CD4) COUNT (NOT AT ARMC)
CD4 % Helper T Cell: 23 % — ABNORMAL LOW (ref 33–55)
CD4 T Cell Abs: 430 /uL (ref 400–2700)

## 2018-02-25 SURGERY — INSERTION OF ARTERIOVENOUS (AV) GORE-TEX GRAFT ARM
Anesthesia: Monitor Anesthesia Care | Site: Arm Upper | Laterality: Left

## 2018-02-25 MED ORDER — DEXAMETHASONE SODIUM PHOSPHATE 10 MG/ML IJ SOLN
INTRAMUSCULAR | Status: DC | PRN
  Administered 2018-02-25: 5 mg via INTRAVENOUS

## 2018-02-25 MED ORDER — PHENYLEPHRINE 40 MCG/ML (10ML) SYRINGE FOR IV PUSH (FOR BLOOD PRESSURE SUPPORT)
PREFILLED_SYRINGE | INTRAVENOUS | Status: AC
  Filled 2018-02-25: qty 10

## 2018-02-25 MED ORDER — ONDANSETRON HCL 4 MG/2ML IJ SOLN
INTRAMUSCULAR | Status: DC | PRN
  Administered 2018-02-25: 4 mg via INTRAVENOUS

## 2018-02-25 MED ORDER — LIDOCAINE-EPINEPHRINE (PF) 1 %-1:200000 IJ SOLN
INTRAMUSCULAR | Status: AC
  Filled 2018-02-25: qty 30

## 2018-02-25 MED ORDER — DEXMEDETOMIDINE HCL IN NACL 400 MCG/100ML IV SOLN
INTRAVENOUS | Status: DC | PRN
  Administered 2018-02-25: .5 ug/kg/h via INTRAVENOUS

## 2018-02-25 MED ORDER — PROTAMINE SULFATE 10 MG/ML IV SOLN
INTRAVENOUS | Status: DC | PRN
  Administered 2018-02-25: 40 mg via INTRAVENOUS

## 2018-02-25 MED ORDER — ARTIFICIAL TEARS OPHTHALMIC OINT
TOPICAL_OINTMENT | OPHTHALMIC | Status: AC
  Filled 2018-02-25: qty 3.5

## 2018-02-25 MED ORDER — ROCURONIUM BROMIDE 10 MG/ML (PF) SYRINGE
PREFILLED_SYRINGE | INTRAVENOUS | Status: AC
  Filled 2018-02-25: qty 5

## 2018-02-25 MED ORDER — 0.9 % SODIUM CHLORIDE (POUR BTL) OPTIME
TOPICAL | Status: DC | PRN
  Administered 2018-02-25: 1000 mL

## 2018-02-25 MED ORDER — ONDANSETRON HCL 4 MG/2ML IJ SOLN
INTRAMUSCULAR | Status: AC
  Filled 2018-02-25: qty 2

## 2018-02-25 MED ORDER — LIDOCAINE HCL (PF) 1 % IJ SOLN
INTRAMUSCULAR | Status: AC
  Filled 2018-02-25: qty 30

## 2018-02-25 MED ORDER — FENTANYL CITRATE (PF) 250 MCG/5ML IJ SOLN
INTRAMUSCULAR | Status: AC
  Filled 2018-02-25: qty 5

## 2018-02-25 MED ORDER — MIDAZOLAM HCL 5 MG/5ML IJ SOLN
INTRAMUSCULAR | Status: DC | PRN
  Administered 2018-02-25: 1 mg via INTRAVENOUS

## 2018-02-25 MED ORDER — SODIUM CHLORIDE 0.9 % IV SOLN
INTRAVENOUS | Status: DC | PRN
  Administered 2018-02-25: 07:00:00

## 2018-02-25 MED ORDER — HEPARIN SODIUM (PORCINE) 1000 UNIT/ML IJ SOLN
INTRAMUSCULAR | Status: AC
  Filled 2018-02-25: qty 1

## 2018-02-25 MED ORDER — OXYCODONE-ACETAMINOPHEN 5-325 MG PO TABS
1.0000 | ORAL_TABLET | ORAL | Status: DC | PRN
  Administered 2018-03-04 – 2018-03-08 (×2): 2 via ORAL
  Filled 2018-02-25 (×5): qty 2

## 2018-02-25 MED ORDER — DEXMEDETOMIDINE HCL IN NACL 200 MCG/50ML IV SOLN
INTRAVENOUS | Status: AC
  Filled 2018-02-25: qty 50

## 2018-02-25 MED ORDER — DEXAMETHASONE SODIUM PHOSPHATE 10 MG/ML IJ SOLN
INTRAMUSCULAR | Status: AC
  Filled 2018-02-25: qty 1

## 2018-02-25 MED ORDER — PROTAMINE SULFATE 10 MG/ML IV SOLN
INTRAVENOUS | Status: AC
  Filled 2018-02-25: qty 25

## 2018-02-25 MED ORDER — PHENYLEPHRINE HCL 10 MG/ML IJ SOLN
INTRAMUSCULAR | Status: DC | PRN
  Administered 2018-02-25 (×4): 40 ug via INTRAVENOUS
  Administered 2018-02-25: 80 ug via INTRAVENOUS
  Administered 2018-02-25: 40 ug via INTRAVENOUS
  Administered 2018-02-25 (×2): 80 ug via INTRAVENOUS
  Administered 2018-02-25: 40 ug via INTRAVENOUS
  Administered 2018-02-25: 80 ug via INTRAVENOUS

## 2018-02-25 MED ORDER — FENTANYL CITRATE (PF) 100 MCG/2ML IJ SOLN
INTRAMUSCULAR | Status: DC | PRN
  Administered 2018-02-25 (×2): 25 ug via INTRAVENOUS

## 2018-02-25 MED ORDER — LIDOCAINE HCL (PF) 1 % IJ SOLN
INTRAMUSCULAR | Status: DC | PRN
  Administered 2018-02-25: 10 mL

## 2018-02-25 MED ORDER — LIDOCAINE-EPINEPHRINE (PF) 1 %-1:200000 IJ SOLN
INTRAMUSCULAR | Status: DC | PRN
  Administered 2018-02-25: 30 mL

## 2018-02-25 MED ORDER — PROPOFOL 10 MG/ML IV BOLUS
INTRAVENOUS | Status: AC
  Filled 2018-02-25: qty 20

## 2018-02-25 MED ORDER — HEPARIN SODIUM (PORCINE) 1000 UNIT/ML IJ SOLN
INTRAMUSCULAR | Status: DC | PRN
  Administered 2018-02-25: 7000 [IU] via INTRAVENOUS

## 2018-02-25 MED ORDER — SUCCINYLCHOLINE CHLORIDE 200 MG/10ML IV SOSY
PREFILLED_SYRINGE | INTRAVENOUS | Status: AC
  Filled 2018-02-25: qty 10

## 2018-02-25 MED ORDER — MIDAZOLAM HCL 2 MG/2ML IJ SOLN
INTRAMUSCULAR | Status: AC
  Filled 2018-02-25: qty 2

## 2018-02-25 MED ORDER — LIDOCAINE HCL (CARDIAC) 20 MG/ML IV SOLN
INTRAVENOUS | Status: AC
  Filled 2018-02-25: qty 5

## 2018-02-25 MED ORDER — SODIUM CHLORIDE 0.9 % IV SOLN
INTRAVENOUS | Status: DC | PRN
  Administered 2018-02-25: 07:00:00 via INTRAVENOUS

## 2018-02-25 MED ORDER — DEXMEDETOMIDINE HCL 200 MCG/2ML IV SOLN
INTRAVENOUS | Status: DC | PRN
  Administered 2018-02-25: 42 ug via INTRAVENOUS

## 2018-02-25 SURGICAL SUPPLY — 38 items
ADH SKN CLS APL DERMABOND .7 (GAUZE/BANDAGES/DRESSINGS) ×1
ARMBAND PINK RESTRICT EXTREMIT (MISCELLANEOUS) ×4 IMPLANT
CANISTER SUCT 3000ML PPV (MISCELLANEOUS) ×3 IMPLANT
CANNULA VESSEL 3MM 2 BLNT TIP (CANNULA) ×3 IMPLANT
CLIP VESOCCLUDE MED 6/CT (CLIP) ×3 IMPLANT
CLIP VESOCCLUDE SM WIDE 6/CT (CLIP) ×5 IMPLANT
DERMABOND ADVANCED (GAUZE/BANDAGES/DRESSINGS) ×2
DERMABOND ADVANCED .7 DNX12 (GAUZE/BANDAGES/DRESSINGS) ×1 IMPLANT
ELECT REM PT RETURN 9FT ADLT (ELECTROSURGICAL) ×3
ELECTRODE REM PT RTRN 9FT ADLT (ELECTROSURGICAL) ×1 IMPLANT
GLOVE BIO SURGEON STRL SZ 6.5 (GLOVE) ×2 IMPLANT
GLOVE BIO SURGEON STRL SZ7.5 (GLOVE) ×3 IMPLANT
GLOVE BIO SURGEONS STRL SZ 6.5 (GLOVE) ×2
GLOVE BIOGEL PI IND STRL 6.5 (GLOVE) IMPLANT
GLOVE BIOGEL PI IND STRL 8 (GLOVE) ×1 IMPLANT
GLOVE BIOGEL PI INDICATOR 6.5 (GLOVE) ×6
GLOVE BIOGEL PI INDICATOR 8 (GLOVE) ×2
GLOVE SURG SS PI 6.5 STRL IVOR (GLOVE) ×2 IMPLANT
GLOVE SURG SS PI 7.0 STRL IVOR (GLOVE) ×2 IMPLANT
GOWN STRL NON-REIN LRG LVL3 (GOWN DISPOSABLE) ×2 IMPLANT
GOWN STRL REUS W/ TWL LRG LVL3 (GOWN DISPOSABLE) ×3 IMPLANT
GOWN STRL REUS W/TWL LRG LVL3 (GOWN DISPOSABLE) ×9
GRAFT GORETEX STRT 4-7X45 (Vascular Products) ×2 IMPLANT
KIT BASIN OR (CUSTOM PROCEDURE TRAY) ×3 IMPLANT
KIT ROOM TURNOVER OR (KITS) ×3 IMPLANT
LOOP VESSEL MAXI BLUE (MISCELLANEOUS) ×2 IMPLANT
NS IRRIG 1000ML POUR BTL (IV SOLUTION) ×3 IMPLANT
PACK CV ACCESS (CUSTOM PROCEDURE TRAY) ×3 IMPLANT
PAD ARMBOARD 7.5X6 YLW CONV (MISCELLANEOUS) ×6 IMPLANT
SPONGE SURGIFOAM ABS GEL 100 (HEMOSTASIS) IMPLANT
SUT PROLENE 6 0 BV (SUTURE) ×8 IMPLANT
SUT VIC AB 3-0 SH 27 (SUTURE) ×3
SUT VIC AB 3-0 SH 27X BRD (SUTURE) ×2 IMPLANT
SUT VICRYL 4-0 PS2 18IN ABS (SUTURE) ×6 IMPLANT
SYR TOOMEY 50ML (SYRINGE) IMPLANT
TOWEL GREEN STERILE (TOWEL DISPOSABLE) ×3 IMPLANT
UNDERPAD 30X30 (UNDERPADS AND DIAPERS) ×3 IMPLANT
WATER STERILE IRR 1000ML POUR (IV SOLUTION) ×3 IMPLANT

## 2018-02-25 NOTE — Progress Notes (Signed)
PT Cancellation Note  Patient Details Name: Karman Veney MRN: [id number] DOB: Apr 13, 1941   Cancelled Treatment:    Reason Eval/Treat Not Completed: Other (comment) Attempted to work with patient, however he is eating lunch and requests that PT return later in day. Plan to attempt to return as/if able and schedule allows.    Deniece Ree PT, DPT, CBIS  Supplemental Physical Therapist [facility]   Pager [phone]

## 2018-02-25 NOTE — Op Note (Signed)
    NAME: David [name]    MRN: [id number] DOB: 10-23-1941    DATE OF OPERATION: 02/25/2018  PREOP DIAGNOSIS:    End-stage renal disease  POSTOP DIAGNOSIS:    Same  PROCEDURE:    New left upper arm AV graft (4-7 mm PTFE graft)  SURGEON: Judeth Cornfield. Scot Dock, MD, FACS  ASSIST: Izetta Dakin RNFA  ANESTHESIA: Local with sedation  EBL: Minimal  INDICATIONS:    Peretz Thieme is a 77 y.o. male who is had a previous left basilic vein transposition.  This failed that he presents for a new graft.  FINDINGS:   4 mm upper arm brachial vein  TECHNIQUE:   The patient was taken to the operating room and sedated.  The left arm was prepped and draped in usual sterile fashion.  After the skin was anesthetized with 1% lidocaine, a longitudinal incision was made over the brachial artery.  The area of the previous anastomosis for the basilic vein transposition was dissected free.  The vein was occluded and sclerotic and this was ligated and divided.  This allowed access to the brachial artery.  This was controlled with a vessel loop.  Separate longitudinal incision was made beneath the axilla after the skin was anesthetized.  The high brachial vein was dissected free.  A 4-7 mm PTFE graft was tunneled between the 2 incisions and the patient was heparinized.  The brachial artery was clamped proximally and distally and a longitudinal arteriotomy was made.  A segment of the 4 mm end of the graft was excised, the graft slightly spatulated and sewn end-to-side to the brachial artery using continuous 6-0 Prolene suture.  The graft then pulled the appropriate length for anastomosis to the high brachial vein.  The vein was ligated distally and spatulated proximally.  The graft was cut to the appropriate length, spatulated and sewn end to end to the vein using continuous 6-0 Prolene suture.  At the completion was an excellent thrill in the graft.  There was a radial and ulnar signal with  the Doppler.  The heparin was partially reversed with protamine.  The wounds were then closed with a deep layer of 3-0 Vicryl and the skin closed with 4-0 Vicryl.  Dermabond was applied.  The patient tolerated the procedure well and was transferred to the recovery room in stable condition.  All needle and sponge counts were correct.  [name], MD, FACS [facility]  DATE OF DICTATION:   02/25/2018

## 2018-02-25 NOTE — Progress Notes (Signed)
Physical Therapy Treatment Patient Details Name: David [name] MRN: [id number] DOB: May 23, 1941 Today's Date: 02/25/2018    History of Present Illness Pt is a 77 y.o. male admitted 02/17/18 with severe SOB and PEA arrest upon arrival requiring CPR before return to [place]; intubated 3/17-3/19. CXR consistent with pulmonary edema. CXR 3/18 shows bilateral pnuemonia. R IJ tunneled HD catheter placed 3/18 to initiate HD. 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     PT Comments    Patient received in bed, pleasant and willing to participate in PT this afternoon; continued working on gait training with mobility and ambulation pattern much improved today, appears to possibly be ready to start working on transition to [facility] while working with PT. He was left in bed with all needs met this afternoon, remains very motivated to participate with PT and return home.     Follow Up Recommendations  No PT follow up;Supervision/Assistance - 24 hour     Equipment Recommendations  None recommended by PT    Recommendations for Other Services       Precautions / Restrictions Precautions Precautions: Fall Precaution Comments: R IJ tunnelled cath Restrictions Weight Bearing Restrictions: No    Mobility  Bed Mobility Overal bed mobility: Modified Independent                Transfers Overall transfer level: Needs assistance Equipment used: Rolling walker (2 wheeled) Transfers: Sit to/from Stand Sit to Stand: Supervision            Ambulation/Gait Ambulation/Gait assistance: Supervision Ambulation Distance (Feet): 200 Feet Assistive device: Rolling walker (2 wheeled) Gait Pattern/deviations: Step-through pattern;Decreased stride length;Decreased dorsiflexion - right;Antalgic     General Gait Details: ambulation and overall mobiltiy pattern has improved, patient able  to ambulate with S with RW and appears ready for transition to [place]            Wheelchair Mobility    Modified Rankin (Stroke Patients Only)       Balance Overall balance assessment: Needs assistance Sitting-balance support: No upper extremity supported;Feet supported Sitting balance-Leahy Scale: Good     Standing balance support: Bilateral upper extremity supported;During functional activity Standing balance-Leahy Scale: Good                              Cognition Arousal/Alertness: Awake/alert Behavior During Therapy: WFL for tasks assessed/performed Overall Cognitive Status: Within Functional Limits for tasks assessed                                        Exercises      General Comments        Pertinent Vitals/Pain Pain Assessment: No/denies pain Pain Score: 0-No pain Pain Intervention(s): Limited activity within patient's tolerance;Monitored during session    Home Living                      Prior Function            PT Goals (current goals can now be found in the care plan section) Acute Rehab PT Goals Patient Stated Goal: Return home PT Goal Formulation: With patient Time For Goal Achievement: 03/06/18 Potential to Achieve Goals: Good Progress towards PT goals: Progressing toward goals  Frequency    Min 3X/week      PT Plan Current plan remains appropriate    Co-evaluation              AM-PAC PT "6 Clicks" Daily Activity  Outcome Measure  Difficulty turning over in bed (including adjusting bedclothes, sheets and blankets)?: None Difficulty moving from lying on back to sitting on the side of the bed? : None Difficulty sitting down on and standing up from a chair with arms (e.g., wheelchair, bedside commode, etc,.)?: None Help needed moving to and from a bed to chair (including a wheelchair)?: A Little Help needed walking in hospital room?: A Little Help needed climbing 3-5 steps  with a railing? : A Little 6 Click Score: 21    End of Session   Activity Tolerance: Patient tolerated treatment well Patient left: in bed;with bed alarm set;with call bell/phone within reach   PT Visit Diagnosis: Other abnormalities of gait and mobility (R26.89)     Time: 1535-1550 PT Time Calculation (min) (ACUTE ONLY): 15 min  Charges:  $Gait Training: 8-22 mins                    G Codes:       Deniece Ree PT, DPT, CBIS  Supplemental Physical Therapist [place]   Pager [phone]

## 2018-02-25 NOTE — Anesthesia Preprocedure Evaluation (Addendum)
Anesthesia Evaluation  Patient identified by MRN, date of birth, ID band Patient awake    Reviewed: Allergy & Precautions, NPO status , Patient's Chart, lab work & pertinent test results  Airway Mallampati: II  TM Distance: >3 FB Neck ROM: Full    Dental  (+) Teeth Intact, Dental Advisory Given   Pulmonary former smoker,    breath sounds clear to auscultation       Cardiovascular hypertension,  Rhythm:Regular Rate:Normal     Neuro/Psych    GI/Hepatic   Endo/Other    Renal/GU      Musculoskeletal   Abdominal   Peds  Hematology   Anesthesia Other Findings   Reproductive/Obstetrics                             Anesthesia Physical Anesthesia Plan  ASA: III  Anesthesia Plan: MAC   Post-op Pain Management:    Induction:   PONV Risk Score and Plan: Ondansetron and Dexamethasone  Airway Management Planned: Natural Airway and Simple Face Mask  Additional Equipment:   Intra-op Plan:   Post-operative Plan:   Informed Consent: I have reviewed the patients History and Physical, chart, labs and discussed the procedure including the risks, benefits and alternatives for the proposed anesthesia with the patient or authorized representative who has indicated his/her understanding and acceptance.   Dental advisory given  Plan Discussed with: CRNA and Anesthesiologist  Anesthesia Plan Comments:         Anesthesia Quick Evaluation

## 2018-02-25 NOTE — Interval H&P Note (Signed)
History and Physical Interval Note:  02/25/2018 7:28 AM  David [name] Malachy Chamber  has presented today for surgery, with the diagnosis of end stage renal disease  The various methods of treatment have been discussed with the patient and family. After consideration of risks, benefits and other options for treatment, the patient has consented to  Procedure(s): INSERTION OF ARTERIOVENOUS (AV) GORE-TEX GRAFT ARM (Left) as a surgical intervention .  The patient's history has been reviewed, patient examined, no change in status, stable for surgery.  I have reviewed the patient's chart and labs.  Questions were answered to the patient's satisfaction.     [name]

## 2018-02-25 NOTE — Anesthesia Postprocedure Evaluation (Signed)
Anesthesia Post Note  Patient: David [name]  Procedure(s) Performed: INSERTION OF ARTERIOVENOUS (AV) GORE-TEX GRAFT LEFT UPPER ARM (Left Arm Upper)     Patient location during evaluation: PACU Anesthesia Type: MAC Level of consciousness: awake and alert Pain management: pain level controlled Vital Signs Assessment: post-procedure vital signs reviewed and stable Respiratory status: spontaneous breathing, nonlabored ventilation, respiratory function stable and patient connected to nasal cannula oxygen Cardiovascular status: stable and blood pressure returned to baseline Postop Assessment: no apparent nausea or vomiting Anesthetic complications: no    Last Vitals:  Vitals:   02/25/18 1034 02/25/18 1700  BP: (!) 90/58 102/62  Pulse: 86 91  Resp: 16 16  Temp: 36.4 C 36.9 C  SpO2: 100% 100%    Last Pain:  Vitals:   02/25/18 1700  TempSrc: Oral  PainSc: 0-No pain                 Alise Calais COKER

## 2018-02-25 NOTE — Transfer of Care (Signed)
Immediate Anesthesia Transfer of Care Note  Patient: David [name]  Procedure(s) Performed: INSERTION OF ARTERIOVENOUS (AV) GORE-TEX GRAFT LEFT UPPER ARM (Left Arm Upper)  Patient Location: PACU  Anesthesia Type:MAC  Level of Consciousness: awake, oriented and patient cooperative  Airway & Oxygen Therapy: Patient Spontanous Breathing and Patient connected to face mask oxygen  Post-op Assessment: Report given to RN and Post -op Vital signs reviewed and stable  Post vital signs: Reviewed  Last Vitals:  Vitals Value Taken Time  BP 94/70 02/25/2018  9:30 AM  Temp    Pulse 74 02/25/2018  9:30 AM  Resp 17 02/25/2018  9:30 AM  SpO2 100 % 02/25/2018  9:30 AM  Vitals shown include unvalidated device data.  Last Pain:  Vitals:   02/25/18 0503  TempSrc: Oral  PainSc:       Patients Stated Pain Goal: 0 (61/60/73 7106)  Complications: No apparent anesthesia complications

## 2018-02-25 NOTE — Progress Notes (Signed)
PROGRESS NOTE    David [name]  RUE:[id number] DOB: [birth year] DOA: 02/17/2018 PCP: [name], NP      Brief Narrative:  77 year old BM PMHx of HIV/AIDS, Prostate Ca, S/P Brachytherapy, CKD stage V not on dialysis, Nonischemic Cardiomyopathy with EF 14-78% and Diastolic dysfunction, and HTN  Presents to ED 3/17. Wife reports that patient has had a dry cough last several days however, other than that has been in usual state on health. This morning around 2-3 am patient woke up with severe shortness of breath asking to be taken to ED. Upon arrival to ED patient was noted to be agonal and pulseless in parking lot. CPR started and continued for estimated 5-10 minutes before return of ROSC. Intubated. CXR consistent with pulmonary edema. Creatinine 5.32, LA 4.44. PCCM asked to admit.      Assessment & Plan:  Acute respiratory failure with hypoxia From PEA arrest.  Extubated 3/19, done well since.  PEA Asystole Arrest ROSC within 5-10 minutes Nonischemic Cardiomyopathy Chronic Diastolic CHF Appears euvolemic now.   -Continue BB -PT recommend 24 hr supervision, no rehab necessary    HIV/AIDS Undetectable HIV RNA.  CD4 430 -Continue Tivicay and Descovy per ID  New ESRD Had fistula placed today.  Has right IJ tunneled cath. -CLIP process pending -HD per nephrology  Anemia of chronic disease -Anemia panel pending  Prostate Cancer s/p brachytherapy         DVT prophylaxis: Heparin Code Status: FULL Family Communication: None present MDM and disposition Plan: The below labs and imaging reports were reviewed.  The patient's status is clinically stable.  Nephrology are continuing dialysis.  When his CLIP process is complete, he      Consultants:   Nephrology  Cardiology  Vascular surgery  Procedures:   Intubated 3/17  Echocardiogram 3/18 EF 15-20%  RIJ tunneled catheter placement 3/18  Extubated 3/19  HD initiated 3/19  Heart  catheterization 3/20, no culprit lesion  Cultures:  3/17 blood NGTD 3/17 MRSA by PCR positive 3/17 respiratory virus panel negative 3/18 hepatitis B negative     Subjective: Having urinary incontinence.  No chest pain, dyspnea, abdominal pain.  No fever, confusion, weakness.    Objective: Vitals:   02/25/18 0955 02/25/18 1010 02/25/18 1034 02/25/18 1700  BP: (!) 84/63 (!) 89/63 (!) 90/58 102/62  Pulse: 83 80 86 91  Resp: 15 15 16 16   Temp:  98.3 F (36.8 C) 97.6 F (36.4 C) 98.4 F (36.9 C)  TempSrc:   Oral Oral  SpO2: 96% 97% 100% 100%  Weight:      Height:        Intake/Output Summary (Last 24 hours) at 02/25/2018 1838 Last data filed at 02/25/2018 1630 Gross per 24 hour  Intake 1220 ml  Output 3 ml  Net 1217 ml   Filed Weights   02/23/18 1110 02/24/18 0409 02/25/18 0104  Weight: 85.4 kg (188 lb 4.4 oz) 83.4 kg (183 lb 13.8 oz) 83.4 kg (183 lb 13.8 oz)    Examination: General appearance: Elderly adult male, alert and in no acute distress.   Skin: Warm and dry.  No suspicious rashes or lesions. Cardiac: RRR, nl S1-S2, no murmurs appreciated.  Capillary refill is brisk.  JVP normal.  No LE edema. New left arm fistula appears well healed. Respiratory: Normal respiratory rate and rhythm.  CTAB without rales or wheezes. Abdomen: Abdomen soft.  No TTP. No ascites, distension, hepatosplenomegaly.   MSK: No deformities or effusions. Neuro: Awake  and alert.  EOMI, moves all extremities. Speech fluent.    Psych: Sensorium intact and responding to questions, attention normal. Affect flat.  Judgment and insight appear normal.    Data Reviewed: I have personally reviewed following labs and imaging studies:  CBC: Recent Labs  Lab 02/21/18 0329 02/22/18 0336 02/23/18 0349 02/24/18 0820 02/25/18 0703 02/25/18 0707  WBC 8.9 9.4 8.9 11.3* 10.0  --   HGB 10.8* 11.9* 12.3* 13.8 12.3* 12.6*  HCT 33.7* 36.3* 37.8* 43.0 37.3* 37.0*  MCV 100.6* 100.6* 99.2 100.5* 97.6  --    PLT 192 202 207 212 210  --    Basic Metabolic Panel: Recent Labs  Lab 02/18/18 1840 02/19/18 0252 02/20/18 0305 02/21/18 0329 02/22/18 0336 02/23/18 0349 02/24/18 0348 02/25/18 0703 02/25/18 0707  NA 139 139 139 142 138 135 137  --  136  K 3.3* 3.4* 3.4* 3.5 4.0 3.8 3.8  --  3.7  CL 104 101 101 103 99* 96* 97*  --   --   CO2 24 24 24 26 23 22 22   --   --   GLUCOSE 115* 116* 116* 128* 122* 116* 114*  --  108*  BUN 102* 71* 78* 89* 54* 87* 55*  --   --   CREATININE 5.60* 4.34* 5.06* 5.05* 4.94* 6.82* 6.59*  --   --   CALCIUM 8.5* 8.6* 8.9 9.2 9.4 9.4 9.6  --   --   MG 2.3 2.2 2.2 2.4 2.3 2.5* 2.4 2.5*  --   PHOS 3.9  3.8 3.7 6.0* 4.9*  --   --   --   --   --    GFR: Estimated Creatinine Clearance: 11.3 mL/min (A) (by C-G formula based on SCr of 6.59 mg/dL (H)). Liver Function Tests: Recent Labs  Lab 02/18/18 1840 02/21/18 0329  ALBUMIN 3.1* 3.2*   No results for input(s): LIPASE, AMYLASE in the last 168 hours. No results for input(s): AMMONIA in the last 168 hours. Coagulation Profile: Recent Labs  Lab 02/25/18 0703  INR 1.05   Cardiac Enzymes: No results for input(s): CKTOTAL, CKMB, CKMBINDEX, TROPONINI in the last 168 hours. BNP (last 3 results) No results for input(s): PROBNP in the last 8760 hours. HbA1C: No results for input(s): HGBA1C in the last 72 hours. CBG: Recent Labs  Lab 02/25/18 0005 02/25/18 0458 02/25/18 1028 02/25/18 1143 02/25/18 1609  GLUCAP 99 106* 119* 160* 113*   Lipid Profile: Recent Labs    02/23/18 0349  TRIG 91   Thyroid Function Tests: No results for input(s): TSH, T4TOTAL, FREET4, T3FREE, THYROIDAB in the last 72 hours. Anemia Panel: No results for input(s): VITAMINB12, FOLATE, FERRITIN, TIBC, IRON, RETICCTPCT in the last 72 hours. Urine analysis:    Component Value Date/Time   COLORURINE YELLOW 02/17/2018 0540   APPEARANCEUR CLOUDY (A) 02/17/2018 0540   LABSPEC 1.010 02/17/2018 0540   PHURINE 5.0 02/17/2018 0540     GLUCOSEU NEGATIVE 02/17/2018 0540   HGBUR LARGE (A) 02/17/2018 0540   BILIRUBINUR NEGATIVE 02/17/2018 0540   KETONESUR NEGATIVE 02/17/2018 0540   PROTEINUR 100 (A) 02/17/2018 0540   NITRITE NEGATIVE 02/17/2018 0540   LEUKOCYTESUR NEGATIVE 02/17/2018 0540   Sepsis Labs: @LABRCNTIP (procalcitonin:4,lacticacidven:4)  ) Recent Results (from the past 240 hour(s))  Culture, blood (routine x 2)     Status: None   Collection Time: 02/17/18  3:07 AM  Result Value Ref Range Status   Specimen Description BLOOD RIGHT HAND  Final   Special Requests IN  PEDIATRIC BOTTLE Blood Culture adequate volume  Final   Culture   Final    NO GROWTH 5 DAYS Performed at [facility], [place] [address], [place], [place] [address]    Report Status 02/22/2018 FINAL  Final  Respiratory Panel by PCR     Status: None   Collection Time: 02/17/18  5:40 AM  Result Value Ref Range Status   Adenovirus NOT DETECTED NOT DETECTED Final   Coronavirus 229E NOT DETECTED NOT DETECTED Final   Coronavirus HKU1 NOT DETECTED NOT DETECTED Final   Coronavirus NL63 NOT DETECTED NOT DETECTED Final   Coronavirus OC43 NOT DETECTED NOT DETECTED Final   Metapneumovirus NOT DETECTED NOT DETECTED Final   Rhinovirus / Enterovirus NOT DETECTED NOT DETECTED Final   Influenza A NOT DETECTED NOT DETECTED Final   Influenza B NOT DETECTED NOT DETECTED Final   Parainfluenza Virus 1 NOT DETECTED NOT DETECTED Final   Parainfluenza Virus 2 NOT DETECTED NOT DETECTED Final   Parainfluenza Virus 3 NOT DETECTED NOT DETECTED Final   Parainfluenza Virus 4 NOT DETECTED NOT DETECTED Final   Respiratory Syncytial Virus NOT DETECTED NOT DETECTED Final   Bordetella pertussis NOT DETECTED NOT DETECTED Final   Chlamydophila pneumoniae NOT DETECTED NOT DETECTED Final   Mycoplasma pneumoniae NOT DETECTED NOT DETECTED Final    Comment: Performed at [facility], [place] [address] [place], [place] [address]  MRSA PCR Screening     Status: Abnormal    Collection Time: 02/17/18  5:40 AM  Result Value Ref Range Status   MRSA by PCR POSITIVE (A) NEGATIVE Final    Comment:        The GeneXpert MRSA Assay (FDA approved for NASAL specimens only), is one component of a comprehensive MRSA colonization surveillance program. It is not intended to diagnose MRSA infection nor to guide or monitor treatment for MRSA infections. RESULT CALLED TO, READ BACK BY AND VERIFIED WITH: KEISHA @ 6761 ON 02/17/18 BY ROBINSON Z.   Culture, blood (routine x 2)     Status: None   Collection Time: 02/17/18  6:09 AM  Result Value Ref Range Status   Specimen Description BLOOD LEFT HAND  Final   Special Requests   Final    BOTTLES DRAWN AEROBIC ONLY Blood Culture results may not be optimal due to an inadequate volume of blood received in culture bottles   Culture   Final    NO GROWTH 5 DAYS Performed at [facility], [place] [address], [place], [place] [address]    Report Status 02/22/2018 FINAL  Final         Radiology Studies: No results found.      Scheduled Meds: . carvedilol  6.25 mg Oral BID WC  . Chlorhexidine Gluconate Cloth  6 each Topical Daily  . dolutegravir  50 mg Oral Daily  . emtricitabine-tenofovir AF  1 tablet Oral Daily  . heparin injection (subcutaneous)  5,000 Units Subcutaneous Q8H  . mouth rinse  15 mL Mouth Rinse BID  . pantoprazole (PROTONIX) IV  40 mg Intravenous QHS  . scopolamine  1 patch Transdermal Q72H  . sodium chloride flush  10-40 mL Intracatheter Q12H  . sodium chloride flush  3 mL Intravenous Q12H   Continuous Infusions: . sodium chloride Stopped (02/19/18 1100)  . sodium chloride       LOS: 8 days    Time spent: 20 minutes    Edwin Dada, MD [facility] 02/25/2018, 6:38 PM  Pager [phone] --- please page though AMION:  [email or web address] If 7PM-7AM, please contact night-coverage

## 2018-02-25 NOTE — Anesthesia Procedure Notes (Signed)
Procedure Name: MAC Date/Time: 02/25/2018 7:35 AM Performed by: Jenne Campus, CRNA Pre-anesthesia Checklist: Patient identified, Emergency Drugs available, Suction available and Patient being monitored Oxygen Delivery Method: Simple face mask

## 2018-02-25 NOTE — Progress Notes (Signed)
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  In ED patient was noted to be agonal and pulseless in parking lot. CPR started and continued for estimated 5-10 minutes before return of ROSC. Intubated. CXR consistent with pulmonary edema. Creatinine 5.32, LA 4.44. PCCM asked to admit.   He has been started on dialysis and is undergoing the CLIP process   Placing fistula this am       Objective:  Vital signs in last 24 hours:  Temp:  [97.4 F (36.3 C)-98.5 F (36.9 C)] 97.6 F (36.4 C) (03/25 1034) Pulse Rate:  [53-93] 86 (03/25 1034) Resp:  [15-23] 16 (03/25 1034) BP: (80-115)/(54-83) 90/58 (03/25 1034) SpO2:  [93 %-100 %] 100 % (03/25 1034) Weight:  [183 lb 13.8 oz (83.4 kg)] 183 lb 13.8 oz (83.4 kg) (03/25 0104)  Weight change: -7 lb 0.9 oz (-3.2 kg) Filed Weights   02/23/18 1110 02/24/18 0409 02/25/18 0104  Weight: 188 lb 4.4 oz (85.4 kg) 183 lb 13.8 oz (83.4 kg) 183 lb 13.8 oz (83.4 kg)    Intake/Output: I/O last 3 completed shifts: In: 1160 [P.O.:1160] Out: 0    Intake/Output this shift:  Total I/O In: 500 [I.V.:300; Other:200] Out: 3 [Blood:3]   General appearance: alert, no distress and sitting up in a chair Neck: no carotid bruit, no JVD and thyroid not enlarged, symmetric, no tenderness/mass/nodules Lungs: clear to auscultation bilaterally Heart: regular rate and rhythm and no murmur Abdomen: soft, non-tender; bowel sounds normal; no masses,  no organomegaly Extremities: extremities normal, atraumatic, no cyanosis or edema Pulses: 2+ and symmetric Skin: Skin color, texture, turgor normal. No rashes or lesions Neurologic: Mental status: Alert, oriented, thought content appropriate Psych: flat affect    Basic Metabolic Panel: Recent Labs  Lab 02/18/18 1840 02/19/18 0252  02/20/18 0305 02/21/18 0329 02/22/18 0336 02/23/18 0349 02/24/18 0348 02/25/18 0703 02/25/18 0707  NA 139 139 139 142 138 135 137  --  136  K 3.3* 3.4* 3.4* 3.5 4.0 3.8 3.8  --  3.7  CL 104 101 101 103 99* 96* 97*  --   --   CO2 24 24 24 26 23 22 22   --   --   GLUCOSE 115* 116* 116* 128* 122* 116* 114*  --  108*  BUN 102* 71* 78* 89* 54* 87* 55*  --   --   CREATININE 5.60* 4.34* 5.06* 5.05* 4.94* 6.82* 6.59*  --   --   CALCIUM 8.5* 8.6* 8.9 9.2 9.4 9.4 9.6  --   --   MG 2.3 2.2 2.2 2.4 2.3 2.5* 2.4 2.5*  --   PHOS 3.9  3.8 3.7 6.0* 4.9*  --   --   --   --   --     Liver Function Tests: Recent Labs  Lab 02/18/18 1840 02/21/18 0329  ALBUMIN 3.1* 3.2*   No results for input(s): LIPASE, AMYLASE in the last 168 hours. No results for input(s): AMMONIA in the last 168 hours.  CBC: Recent Labs  Lab 02/21/18 0329 02/22/18 0336 02/23/18 0349 02/24/18 0820 02/25/18 0703 02/25/18 0707  WBC 8.9 9.4 8.9 11.3* 10.0  --   HGB 10.8* 11.9* 12.3* 13.8 12.3* 12.6*  HCT 33.7* 36.3* 37.8* 43.0 37.3* 37.0*  MCV 100.6* 100.6* 99.2 100.5* 97.6  --   PLT 192 202  207 212 210  --     Cardiac Enzymes: No results for input(s): CKTOTAL, CKMB, CKMBINDEX, TROPONINI in the last 168 hours.  BNP: Invalid input(s): POCBNP  CBG: Recent Labs  Lab 02/24/18 1617 02/24/18 2005 02/25/18 0005 02/25/18 0458 02/25/18 1028  GLUCAP 144* 151* 95 106* 119*    Microbiology: Results for orders placed or performed during the hospital encounter of 02/17/18  Culture, blood (routine x 2)     Status: None   Collection Time: 02/17/18  3:07 AM  Result Value Ref Range Status   Specimen Description BLOOD RIGHT HAND  Final   Special Requests IN PEDIATRIC BOTTLE Blood Culture adequate volume  Final   Culture   Final    NO GROWTH 5 DAYS Performed at [facility], [place] [address] [place], [place] [address]    Report Status 02/22/2018 FINAL  Final  Respiratory Panel by PCR     Status: None    Collection Time: 02/17/18  5:40 AM  Result Value Ref Range Status   Adenovirus NOT DETECTED NOT DETECTED Final   Coronavirus 229E NOT DETECTED NOT DETECTED Final   Coronavirus HKU1 NOT DETECTED NOT DETECTED Final   Coronavirus NL63 NOT DETECTED NOT DETECTED Final   Coronavirus OC43 NOT DETECTED NOT DETECTED Final   Metapneumovirus NOT DETECTED NOT DETECTED Final   Rhinovirus / Enterovirus NOT DETECTED NOT DETECTED Final   Influenza A NOT DETECTED NOT DETECTED Final   Influenza B NOT DETECTED NOT DETECTED Final   Parainfluenza Virus 1 NOT DETECTED NOT DETECTED Final   Parainfluenza Virus 2 NOT DETECTED NOT DETECTED Final   Parainfluenza Virus 3 NOT DETECTED NOT DETECTED Final   Parainfluenza Virus 4 NOT DETECTED NOT DETECTED Final   Respiratory Syncytial Virus NOT DETECTED NOT DETECTED Final   Bordetella pertussis NOT DETECTED NOT DETECTED Final   Chlamydophila pneumoniae NOT DETECTED NOT DETECTED Final   Mycoplasma pneumoniae NOT DETECTED NOT DETECTED Final    Comment: Performed at [facility], [place] [address], [place], [place] [address]  MRSA PCR Screening     Status: Abnormal   Collection Time: 02/17/18  5:40 AM  Result Value Ref Range Status   MRSA by PCR POSITIVE (A) NEGATIVE Final    Comment:        The GeneXpert MRSA Assay (FDA approved for NASAL specimens only), is one component of a comprehensive MRSA colonization surveillance program. It is not intended to diagnose MRSA infection nor to guide or monitor treatment for MRSA infections. RESULT CALLED TO, READ BACK BY AND VERIFIED WITH: KEISHA @ 6759 ON 02/17/18 BY ROBINSON Z.   Culture, blood (routine x 2)     Status: None   Collection Time: 02/17/18  6:09 AM  Result Value Ref Range Status   Specimen Description BLOOD LEFT HAND  Final   Special Requests   Final    BOTTLES DRAWN AEROBIC ONLY Blood Culture results may not be optimal due to an inadequate volume of blood received in culture bottles   Culture   Final     NO GROWTH 5 DAYS Performed at [facility], [place] [address], [place], [place] [address]    Report Status 02/22/2018 FINAL  Final    Coagulation Studies: Recent Labs    02/25/18 0703  LABPROT 13.6  INR 1.05    Urinalysis: No results for input(s): COLORURINE, LABSPEC, PHURINE, GLUCOSEU, HGBUR, BILIRUBINUR, KETONESUR, PROTEINUR, UROBILINOGEN, NITRITE, LEUKOCYTESUR in the last 72 hours.  Invalid input(s): APPERANCEUR  Imaging: No results found.   Medications:   . sodium chloride Stopped (02/19/18 1100)  . sodium chloride     . carvedilol  6.25 mg Oral BID WC  . Chlorhexidine Gluconate Cloth  6 each Topical Daily  . dolutegravir  50 mg Oral Daily  . emtricitabine-tenofovir AF  1 tablet Oral Daily  . heparin injection (subcutaneous)  5,000 Units Subcutaneous Q8H  . mouth rinse  15 mL Mouth Rinse BID  . mupirocin ointment   Nasal BID  . pantoprazole (PROTONIX) IV  40 mg Intravenous QHS  . scopolamine  1 patch Transdermal Q72H  . sodium chloride flush  10-40 mL Intracatheter Q12H  . sodium chloride flush  3 mL Intravenous Q12H   sodium chloride, sodium chloride, acetaminophen, midazolam, midazolam, ondansetron (ZOFRAN) IV, oxyCODONE-acetaminophen, sodium chloride flush, sodium chloride flush  Assessment/ Plan:   ESRD- new ESRD  Undergoing CLIP process   Fistula being created   ANEMIA- Hb 11.9   MBD- difficulty tolerating phoslo  Will stop at this point  Phos in 4 range we will follow  HTN/VOL-  Controlled   ACCESS-  For AVF  3/25  possibly  OTHER- EF 20 %   HIV   antiretrovirals    LOS: 8 David [name] @TODAY @11 :43 AM

## 2018-02-26 ENCOUNTER — Inpatient Hospital Stay (HOSPITAL_COMMUNITY): Payer: Medicare HMO | Admitting: Anesthesiology

## 2018-02-26 ENCOUNTER — Encounter (HOSPITAL_COMMUNITY)
Admission: EM | Disposition: A | Discharge status: Home / Self Care | Payer: Self-pay | Source: Home / Self Care | Attending: Internal Medicine

## 2018-02-26 ENCOUNTER — Encounter (HOSPITAL_COMMUNITY): Payer: Self-pay | Admitting: Certified Registered Nurse Anesthetist

## 2018-02-26 DIAGNOSIS — T82868A Thrombosis of vascular prosthetic devices, implants and grafts, initial encounter: Secondary | ICD-10-CM

## 2018-02-26 HISTORY — PX: THROMBECTOMY W/ EMBOLECTOMY: SHX2507

## 2018-02-26 LAB — BASIC METABOLIC PANEL
Anion gap: 19 — ABNORMAL HIGH (ref 5–15)
BUN: 110 mg/dL — AB (ref 6–20)
CO2: 18 mmol/L — ABNORMAL LOW (ref 22–32)
CREATININE: 10.89 mg/dL — AB (ref 0.61–1.24)
Calcium: 9.1 mg/dL (ref 8.9–10.3)
Chloride: 101 mmol/L (ref 101–111)
GFR calc Af Amer: 5 mL/min — ABNORMAL LOW (ref >60)
GFR, EST NON AFRICAN AMERICAN: 4 mL/min — AB (ref >60)
Glucose, Bld: 114 mg/dL — ABNORMAL HIGH (ref 65–99)
POTASSIUM: 4.2 mmol/L (ref 3.5–5.1)
SODIUM: 138 mmol/L (ref 135–145)

## 2018-02-26 LAB — CBC
HCT: 37.6 % — ABNORMAL LOW (ref 39.0–52.0)
Hemoglobin: 12.4 g/dL — ABNORMAL LOW (ref 13.0–17.0)
MCH: 31.7 pg (ref 26.0–34.0)
MCHC: 33 g/dL (ref 30.0–36.0)
MCV: 96.2 fL (ref 78.0–100.0)
PLATELETS: 223 10*3/uL (ref 150–400)
RBC: 3.91 MIL/uL — AB (ref 4.22–5.81)
RDW: 13 % (ref 11.5–15.5)
WBC: 11.3 10*3/uL — ABNORMAL HIGH (ref 4.0–10.5)

## 2018-02-26 LAB — GLUCOSE, CAPILLARY
GLUCOSE-CAPILLARY: 104 mg/dL — AB (ref 65–99)
Glucose-Capillary: 104 mg/dL — ABNORMAL HIGH (ref 65–99)
Glucose-Capillary: 124 mg/dL — ABNORMAL HIGH (ref 65–99)
Glucose-Capillary: 134 mg/dL — ABNORMAL HIGH (ref 65–99)
Glucose-Capillary: 161 mg/dL — ABNORMAL HIGH (ref 65–99)
Glucose-Capillary: 95 mg/dL (ref 65–99)

## 2018-02-26 LAB — TRIGLYCERIDES: Triglycerides: 164 mg/dL — ABNORMAL HIGH (ref <150)

## 2018-02-26 LAB — MAGNESIUM: MAGNESIUM: 2.6 mg/dL — AB (ref 1.7–2.4)

## 2018-02-26 SURGERY — THROMBECTOMY ARTERIOVENOUS GORE-TEX GRAFT
Anesthesia: Monitor Anesthesia Care | Site: Arm Upper | Laterality: Left

## 2018-02-26 MED ORDER — FENTANYL CITRATE (PF) 100 MCG/2ML IJ SOLN
INTRAMUSCULAR | Status: DC | PRN
  Administered 2018-02-26 (×3): 25 ug via INTRAVENOUS

## 2018-02-26 MED ORDER — SODIUM CHLORIDE 0.9 % IV SOLN
INTRAVENOUS | Status: DC | PRN
  Administered 2018-02-26: 15:00:00 via INTRAVENOUS

## 2018-02-26 MED ORDER — SODIUM CHLORIDE 0.9 % IV SOLN
INTRAVENOUS | Status: AC
  Filled 2018-02-26: qty 1.2

## 2018-02-26 MED ORDER — LIDOCAINE-EPINEPHRINE (PF) 1 %-1:200000 IJ SOLN
INTRAMUSCULAR | Status: AC
Start: 2018-02-26 — End: 2018-02-26
  Filled 2018-02-26: qty 30

## 2018-02-26 MED ORDER — LIDOCAINE-EPINEPHRINE (PF) 1 %-1:200000 IJ SOLN
INTRAMUSCULAR | Status: DC | PRN
  Administered 2018-02-26: 19 mL

## 2018-02-26 MED ORDER — ONDANSETRON HCL 4 MG/2ML IJ SOLN
INTRAMUSCULAR | Status: DC | PRN
  Administered 2018-02-26: 4 mg via INTRAVENOUS

## 2018-02-26 MED ORDER — HYDROMORPHONE HCL 1 MG/ML IJ SOLN: 0.2500 mg | INTRAMUSCULAR | Status: DC | PRN

## 2018-02-26 MED ORDER — CEFAZOLIN SODIUM-DEXTROSE 1-4 GM/50ML-% IV SOLN
INTRAVENOUS | Status: DC | PRN
  Administered 2018-02-26: 1 g via INTRAVENOUS

## 2018-02-26 MED ORDER — LIDOCAINE HCL (CARDIAC) 20 MG/ML IV SOLN
INTRAVENOUS | Status: AC
  Filled 2018-02-26: qty 10

## 2018-02-26 MED ORDER — FENTANYL CITRATE (PF) 250 MCG/5ML IJ SOLN
INTRAMUSCULAR | Status: AC
  Filled 2018-02-26: qty 5

## 2018-02-26 MED ORDER — MIDAZOLAM HCL 2 MG/2ML IJ SOLN
INTRAMUSCULAR | Status: AC
Start: 2018-02-26 — End: 2018-02-26
  Filled 2018-02-26: qty 2

## 2018-02-26 MED ORDER — 0.9 % SODIUM CHLORIDE (POUR BTL) OPTIME
TOPICAL | Status: DC | PRN
  Administered 2018-02-26: 1000 mL

## 2018-02-26 MED ORDER — PHENYLEPHRINE HCL 10 MG/ML IJ SOLN
INTRAVENOUS | Status: DC | PRN
  Administered 2018-02-26: 15 ug/min via INTRAVENOUS

## 2018-02-26 MED ORDER — HEPARIN SODIUM (PORCINE) 1000 UNIT/ML IJ SOLN
INTRAMUSCULAR | Status: DC | PRN
  Administered 2018-02-26: 7000 [IU] via INTRAVENOUS

## 2018-02-26 MED ORDER — SODIUM CHLORIDE 0.9 % IV SOLN: INTRAVENOUS | Status: DC

## 2018-02-26 MED ORDER — LIDOCAINE HCL (PF) 1 % IJ SOLN
INTRAMUSCULAR | Status: AC
Start: 2018-02-26 — End: 2018-02-26
  Filled 2018-02-26: qty 30

## 2018-02-26 MED ORDER — MEPERIDINE HCL 50 MG/ML IJ SOLN: 6.2500 mg | INTRAMUSCULAR | Status: DC | PRN

## 2018-02-26 MED ORDER — MIDAZOLAM HCL 5 MG/5ML IJ SOLN
INTRAMUSCULAR | Status: DC | PRN
  Administered 2018-02-26 (×2): 0.5 mg via INTRAVENOUS

## 2018-02-26 MED ORDER — PROPOFOL 10 MG/ML IV BOLUS
INTRAVENOUS | Status: AC
  Filled 2018-02-26: qty 20

## 2018-02-26 MED ORDER — ONDANSETRON HCL 4 MG/2ML IJ SOLN: 4.0000 mg | Freq: Once | INTRAMUSCULAR | Status: DC | PRN

## 2018-02-26 MED ORDER — PROTAMINE SULFATE 10 MG/ML IV SOLN
INTRAVENOUS | Status: DC | PRN
  Administered 2018-02-26: 30 mg via INTRAVENOUS

## 2018-02-26 MED ORDER — SODIUM CHLORIDE 0.9 % IV SOLN
INTRAVENOUS | Status: DC | PRN
  Administered 2018-02-26: 14:00:00 500 mL

## 2018-02-26 MED ORDER — PRO-STAT SUGAR FREE PO LIQD
30.0000 mL | Freq: Two times a day (BID) | ORAL | Status: DC
  Administered 2018-02-27 – 2018-03-06 (×5): 30 mL via ORAL
  Filled 2018-02-26 (×16): qty 30

## 2018-02-26 SURGICAL SUPPLY — 37 items
ADH SKN CLS APL DERMABOND .7 (GAUZE/BANDAGES/DRESSINGS) ×1
ADH SKN CLS LQ APL DERMABOND (GAUZE/BANDAGES/DRESSINGS) ×1
ARMBAND PINK RESTRICT EXTREMIT (MISCELLANEOUS) ×3 IMPLANT
CANISTER SUCT 3000ML PPV (MISCELLANEOUS) ×3 IMPLANT
CANNULA VESSEL 3MM 2 BLNT TIP (CANNULA) IMPLANT
CATH EMB 4FR 80CM (CATHETERS) ×3 IMPLANT
CLIP VESOCCLUDE MED 6/CT (CLIP) ×3 IMPLANT
CLIP VESOCCLUDE SM WIDE 6/CT (CLIP) ×3 IMPLANT
DERMABOND ADHESIVE PROPEN (GAUZE/BANDAGES/DRESSINGS) ×2
DERMABOND ADVANCED (GAUZE/BANDAGES/DRESSINGS) ×2
DERMABOND ADVANCED .7 DNX12 (GAUZE/BANDAGES/DRESSINGS) ×1 IMPLANT
DERMABOND ADVANCED .7 DNX6 (GAUZE/BANDAGES/DRESSINGS) IMPLANT
DRAPE X-RAY CASS 24X20 (DRAPES) IMPLANT
ELECT REM PT RETURN 9FT ADLT (ELECTROSURGICAL) ×3
ELECTRODE REM PT RTRN 9FT ADLT (ELECTROSURGICAL) ×1 IMPLANT
GAUZE SPONGE 4X4 16PLY XRAY LF (GAUZE/BANDAGES/DRESSINGS) IMPLANT
GLOVE BIO SURGEON STRL SZ7.5 (GLOVE) ×3 IMPLANT
GLOVE BIOGEL PI IND STRL 8 (GLOVE) ×1 IMPLANT
GLOVE BIOGEL PI INDICATOR 8 (GLOVE) ×2
GOWN STRL REUS W/ TWL LRG LVL3 (GOWN DISPOSABLE) ×3 IMPLANT
GOWN STRL REUS W/TWL LRG LVL3 (GOWN DISPOSABLE) ×9
KIT BASIN OR (CUSTOM PROCEDURE TRAY) ×3 IMPLANT
KIT ROOM TURNOVER OR (KITS) ×3 IMPLANT
NS IRRIG 1000ML POUR BTL (IV SOLUTION) ×3 IMPLANT
PACK CV ACCESS (CUSTOM PROCEDURE TRAY) ×3 IMPLANT
PAD ARMBOARD 7.5X6 YLW CONV (MISCELLANEOUS) ×6 IMPLANT
SET COLLECT BLD 21X3/4 12 (NEEDLE) IMPLANT
SPONGE SURGIFOAM ABS GEL 100 (HEMOSTASIS) IMPLANT
STOPCOCK 4 WAY LG BORE MALE ST (IV SETS) IMPLANT
SUT PROLENE 6 0 BV (SUTURE) ×9 IMPLANT
SUT VIC AB 3-0 SH 27 (SUTURE) ×3
SUT VIC AB 3-0 SH 27X BRD (SUTURE) ×1 IMPLANT
SUT VICRYL 4-0 PS2 18IN ABS (SUTURE) ×3 IMPLANT
TOWEL GREEN STERILE (TOWEL DISPOSABLE) ×3 IMPLANT
TUBING EXTENTION W/L.L. (IV SETS) IMPLANT
UNDERPAD 30X30 (UNDERPADS AND DIAPERS) ×3 IMPLANT
WATER STERILE IRR 1000ML POUR (IV SOLUTION) ×3 IMPLANT

## 2018-02-26 NOTE — Progress Notes (Signed)
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  In ED patient was noted to be agonal and pulseless in parking lot. CPR started and continued for estimated 5-10 minutes before return of ROSC. Intubated. CXR consistent with pulmonary edema. Creatinine 5.32, LA 4.44. PCCM asked to admit.   He has been started on dialysis and is undergoing the CLIP process   Placing fistula this am       Objective:  Vital signs in last 24 hours:  Temp:  [97.6 F (36.4 C)-98.5 F (36.9 C)] 97.6 F (36.4 C) (03/26 0758) Pulse Rate:  [79-91] 79 (03/26 0758) Resp:  [15-16] 16 (03/26 0758) BP: (102-116)/(62-72) 116/72 (03/26 0758) SpO2:  [96 %-100 %] 99 % (03/26 0758) Weight:  [187 lb (84.8 kg)] 187 lb (84.8 kg) (03/25 2223)  Weight change: 3 lb 2.2 oz (1.423 kg) Filed Weights   02/24/18 0409 02/25/18 0104 02/25/18 2223  Weight: 183 lb 13.8 oz (83.4 kg) 183 lb 13.8 oz (83.4 kg) 187 lb (84.8 kg)    Intake/Output: I/O last 3 completed shifts: In: 4888 [P.O.:720; I.V.:300; Other:200] Out: 153 [Urine:150; Blood:3]   Intake/Output this shift:  Total I/O In: 240 [P.O.:240] Out: -    General appearance: alert, no distress and sitting up in a chair Neck: no carotid bruit, no JVD and thyroid not enlarged, symmetric, no tenderness/mass/nodules Lungs: clear to auscultation bilaterally Heart: regular rate and rhythm and no murmur Abdomen: soft, non-tender; bowel sounds normal; no masses,  no organomegaly Extremities: extremities normal, atraumatic, no cyanosis or edema Pulses: 2+ and symmetric Skin: Skin color, texture, turgor normal. No rashes or lesions Neurologic: Mental status: Alert, oriented, thought content appropriate Psych: flat affect    Basic Metabolic Panel: Recent Labs  Lab 02/20/18 0305 02/21/18 0329  02/22/18 0336 02/23/18 0349 02/24/18 0348 02/25/18 0703 02/25/18 0707 02/26/18 0848  NA 139 142 138 135 137  --  136 138  K 3.4* 3.5 4.0 3.8 3.8  --  3.7 4.2  CL 101 103 99* 96* 97*  --   --  101  CO2 24 26 23 22 22   --   --  18*  GLUCOSE 116* 128* 122* 116* 114*  --  108* 114*  BUN 78* 89* 54* 87* 55*  --   --  110*  CREATININE 5.06* 5.05* 4.94* 6.82* 6.59*  --   --  10.89*  CALCIUM 8.9 9.2 9.4 9.4 9.6  --   --  9.1  MG 2.2 2.4 2.3 2.5* 2.4 2.5*  --  2.6*  PHOS 6.0* 4.9*  --   --   --   --   --   --     Liver Function Tests: Recent Labs  Lab 02/21/18 0329  ALBUMIN 3.2*   No results for input(s): LIPASE, AMYLASE in the last 168 hours. No results for input(s): AMMONIA in the last 168 hours.  CBC: Recent Labs  Lab 02/22/18 0336 02/23/18 0349 02/24/18 0820 02/25/18 0703 02/25/18 0707 02/26/18 0848  WBC 9.4 8.9 11.3* 10.0  --  11.3*  HGB 11.9* 12.3* 13.8 12.3* 12.6* 12.4*  HCT 36.3* 37.8* 43.0 37.3* 37.0* 37.6*  MCV 100.6* 99.2 100.5* 97.6  --  96.2  PLT 202 207 212 210  --  223    Cardiac Enzymes: No results for input(s): CKTOTAL, CKMB, CKMBINDEX, TROPONINI  in the last 168 hours.  BNP: Invalid input(s): POCBNP  CBG: Recent Labs  Lab 02/25/18 1943 02/25/18 2351 02/26/18 0349 02/26/18 0752 02/26/18 1140  GLUCAP 178* 95 104* 134* 14*    Microbiology: Results for orders placed or performed during the hospital encounter of 02/17/18  Culture, blood (routine x 2)     Status: None   Collection Time: 02/17/18  3:07 AM  Result Value Ref Range Status   Specimen Description BLOOD RIGHT HAND  Final   Special Requests IN PEDIATRIC BOTTLE Blood Culture adequate volume  Final   Culture   Final    NO GROWTH 5 DAYS Performed at [facility], [place] [address], [place], [place] [address]    Report Status 02/22/2018 FINAL  Final  Respiratory Panel by PCR     Status: None   Collection Time: 02/17/18  5:40 AM  Result Value Ref Range Status   Adenovirus NOT  DETECTED NOT DETECTED Final   Coronavirus 229E NOT DETECTED NOT DETECTED Final   Coronavirus HKU1 NOT DETECTED NOT DETECTED Final   Coronavirus NL63 NOT DETECTED NOT DETECTED Final   Coronavirus OC43 NOT DETECTED NOT DETECTED Final   Metapneumovirus NOT DETECTED NOT DETECTED Final   Rhinovirus / Enterovirus NOT DETECTED NOT DETECTED Final   Influenza A NOT DETECTED NOT DETECTED Final   Influenza B NOT DETECTED NOT DETECTED Final   Parainfluenza Virus 1 NOT DETECTED NOT DETECTED Final   Parainfluenza Virus 2 NOT DETECTED NOT DETECTED Final   Parainfluenza Virus 3 NOT DETECTED NOT DETECTED Final   Parainfluenza Virus 4 NOT DETECTED NOT DETECTED Final   Respiratory Syncytial Virus NOT DETECTED NOT DETECTED Final   Bordetella pertussis NOT DETECTED NOT DETECTED Final   Chlamydophila pneumoniae NOT DETECTED NOT DETECTED Final   Mycoplasma pneumoniae NOT DETECTED NOT DETECTED Final    Comment: Performed at [facility], [place] [address] [place], [place] [address]  MRSA PCR Screening     Status: Abnormal   Collection Time: 02/17/18  5:40 AM  Result Value Ref Range Status   MRSA by PCR POSITIVE (A) NEGATIVE Final    Comment:        The GeneXpert MRSA Assay (FDA approved for NASAL specimens only), is one component of a comprehensive MRSA colonization surveillance program. It is not intended to diagnose MRSA infection nor to guide or monitor treatment for MRSA infections. RESULT CALLED TO, READ BACK BY AND VERIFIED WITH: KEISHA @ 5465 ON 02/17/18 BY ROBINSON Z.   Culture, blood (routine x 2)     Status: None   Collection Time: 02/17/18  6:09 AM  Result Value Ref Range Status   Specimen Description BLOOD LEFT HAND  Final   Special Requests   Final    BOTTLES DRAWN AEROBIC ONLY Blood Culture results may not be optimal due to an inadequate volume of blood received in culture bottles   Culture   Final    NO GROWTH 5 DAYS Performed at [facility], [place] [address],  [place], [place] [address]    Report Status 02/22/2018 FINAL  Final    Coagulation Studies: Recent Labs    02/25/18 0703  LABPROT 13.6  INR 1.05    Urinalysis: No results for input(s): COLORURINE, LABSPEC, PHURINE, GLUCOSEU, HGBUR, BILIRUBINUR, KETONESUR, PROTEINUR, UROBILINOGEN, NITRITE, LEUKOCYTESUR in the last 72 hours.  Invalid input(s): APPERANCEUR    Imaging: No results found.   Medications:   . [MAR Hold] sodium chloride    .  sodium chloride     . [MAR Hold] carvedilol  6.25 mg Oral BID WC  . [MAR Hold] Chlorhexidine Gluconate Cloth  6 each Topical Daily  . [MAR Hold] dolutegravir  50 mg Oral Daily  . [MAR Hold] emtricitabine-tenofovir AF  1 tablet Oral Daily  . [MAR Hold] heparin injection (subcutaneous)  5,000 Units Subcutaneous Q8H  . [MAR Hold] mouth rinse  15 mL Mouth Rinse BID  . [MAR Hold] scopolamine  1 patch Transdermal Q72H  . [MAR Hold] sodium chloride flush  10-40 mL Intracatheter Q12H  . [MAR Hold] sodium chloride flush  3 mL Intravenous Q12H   [MAR Hold] sodium chloride, 0.9 % irrigation (POUR BTL), [MAR Hold] acetaminophen, heparin irrigation 6000 unit, [MAR Hold] ondansetron (ZOFRAN) IV, [MAR Hold] oxyCODONE-acetaminophen, [MAR Hold] sodium chloride flush, [MAR Hold] sodium chloride flush  Assessment/ Plan:   ESRD- new ESRD  Undergoing CLIP process   Fistula created  Plan dialysis today on MWF schedule  ANEMIA- Hb 11.9   MBD- difficulty tolerating phoslo  Will stop at this point  Phos in 4 range we will follow  HTN/VOL-  Controlled   ACCESS-  For AVF  3/25  possibly  OTHER- EF 20 %   HIV   antiretrovirals    LOS: 9 Sameria Morss W @TODAY @2 :57 PM

## 2018-02-26 NOTE — Op Note (Signed)
    NAME: David [name]    MRN: [id number] DOB: 09-Oct-1941    DATE OF OPERATION: 02/26/2018  PREOP DIAGNOSIS:    Clotted left upper arm AV graft  POSTOP DIAGNOSIS:    Same  PROCEDURE:    Thrombectomy of left upper arm AV graft  SURGEON: Judeth Cornfield. Scot Dock, MD, FACS  ASSIST: Arlee Muslim PA  ANESTHESIA: Local with sedation  EBL: Minimal  INDICATIONS:    David [name] is a 77 y.o. male who had a new left upper arm graft placed yesterday.  Patient was felt to have an adequate vein and artery and it was felt that graft thrombosis was likely secondary to hypotension and his low ejection fraction.  However I felt it was worth one attempted thrombectomy.  FINDINGS:   The vein was widely patent.  The arterial plug was clearly retrieved.  Thrill was somewhat weak likely related to his low ejection fraction.    TECHNIQUE:   The patient was taken to the operating room and sedated by anesthesia.  The left upper extremity was prepped and draped in usual sterile fashion.  After the skin was anesthetized with 1% lidocaine, the previous incision in the axilla was opened in the venous limb of the graft dissected free.  The patient was heparinized.  The graft was divided.  Graft thrombectomy was achieved using a #4 Fogarty catheter.  The arterial plug was clearly retrieved.  Venous thrombectomy was then performed.  The venous anastomosis was widely patent.  The vein easily took a 5 mm dilator.  The vein was flushed with heparinized saline and clamped.  The graft was then sewn back in the end with continuous 6-0 Prolene suture.  At the completion was an good thrill in the graft.  There was a palpable radial pulse.  The heparin was partially reversed with protamine.  The wound was then closed with a deep layer of 3-0 Vicryl and the skin closed with 4-0 Vicryl.  Dermabond was applied.  The patient tolerated the procedure well and was transferred to the recovery room in stable  condition.  All needle and sponge counts were correct.  [name], MD, FACS [facility]  DATE OF DICTATION:   02/26/2018

## 2018-02-26 NOTE — Progress Notes (Signed)
Physical Therapy Treatment Patient Details Name: David [name] MRN: [id number] DOB: [birth year] Today's Date: 02/26/2018    History of Present Illness Pt is a 77 y.o. male admitted 02/17/18 with severe SOB and PEA arrest upon arrival requiring CPR before return to [place]; intubated 3/17-3/19. CXR consistent with pulmonary edema. CXR 3/18 shows bilateral pnuemonia. R IJ tunneled HD catheter placed 3/18 to initiate HD. 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     PT Comments    Pt progressing well towards all functional mobility goals. Pt supervision for bed mobility, transfers, and ambulation at this time. Able to progress to [facility] for ambulation this session with noted improved cadence and upright posture. Current plan remains appropriate. Will continue to follow acutely and progress as tolerated.     Follow Up Recommendations  No PT follow up;Supervision/Assistance - 24 hour     Equipment Recommendations  None recommended by PT    Recommendations for Other Services       Precautions / Restrictions Precautions Precautions: Fall Precaution Comments: R IJ tunnelled cath Restrictions Weight Bearing Restrictions: No    Mobility  Bed Mobility Overal bed mobility: Modified Independent             General bed mobility comments: mod I for increased time, no physical assist required  Transfers Overall transfer level: Needs assistance Equipment used: Rolling walker (2 wheeled) Transfers: Sit to/from Stand Sit to Stand: Supervision         General transfer comment: supervision for safety and very mild instability, no physical assist required, proper hand placement without VCs  Ambulation/Gait Ambulation/Gait assistance: Supervision Ambulation Distance (Feet): 400 Feet Assistive device: Rolling walker (2 wheeled);Straight cane Gait Pattern/deviations:  Step-through pattern;Decreased stride length;Decreased dorsiflexion - right;Antalgic Gait velocity: decreased Gait velocity interpretation: <1.8 ft/sec, indicative of risk for recurrent falls General Gait Details: pt ambulated 200 ft with RW then transitioned to [facility]. Pt with improved posture and cadence with SPC compared to RW. Able to manage [facility] safely without cues. Supervision for safety and mild instability   Stairs            Wheelchair Mobility    Modified Rankin (Stroke Patients Only)       Balance Overall balance assessment: Needs assistance Sitting-balance support: No upper extremity supported;Feet supported Sitting balance-Leahy Scale: Good     Standing balance support: Single extremity supported;During functional activity Standing balance-Leahy Scale: Fair Standing balance comment: able to perform static standing without UE support, requires UE support for ambulation                            Cognition Arousal/Alertness: Awake/alert Behavior During Therapy: WFL for tasks assessed/performed Overall Cognitive Status: Within Functional Limits for tasks assessed                                        Exercises      General Comments        Pertinent Vitals/Pain Pain Assessment: No/denies pain Pain Intervention(s): Monitored during session;Limited activity within patient's tolerance;Repositioned    Home Living                      Prior Function            PT Goals (  current goals can now be found in the care plan section) Acute Rehab PT Goals Patient Stated Goal: Return home PT Goal Formulation: With patient Time For Goal Achievement: 03/06/18 Potential to Achieve Goals: Good Progress towards PT goals: Progressing toward goals    Frequency    Min 3X/week      PT Plan Current plan remains appropriate    Co-evaluation              AM-PAC PT "6 Clicks" Daily Activity  Outcome Measure  Difficulty  turning over in bed (including adjusting bedclothes, sheets and blankets)?: None Difficulty moving from lying on back to sitting on the side of the bed? : None Difficulty sitting down on and standing up from a chair with arms (e.g., wheelchair, bedside commode, etc,.)?: None Help needed moving to and from a bed to chair (including a wheelchair)?: A Little Help needed walking in hospital room?: A Little Help needed climbing 3-5 steps with a railing? : A Little 6 Click Score: 21    End of Session Equipment Utilized During Treatment: Gait belt Activity Tolerance: Patient tolerated treatment well Patient left: in chair;with call bell/phone within reach Nurse Communication: Mobility status PT Visit Diagnosis: Other abnormalities of gait and mobility (R26.89)     Time: 9417-4081 PT Time Calculation (min) (ACUTE ONLY): 22 min  Charges:  $Gait Training: 8-22 mins                    G Codes:       Vic Ripper, SPT   Vic Ripper 02/26/2018, 12:30 PM

## 2018-02-26 NOTE — Anesthesia Postprocedure Evaluation (Signed)
Anesthesia Post Note  Patient: David [name]  Procedure(s) Performed: THROMBECTOMY ARTERIOVENOUS GRAFT (Left Arm Upper)     Patient location during evaluation: PACU Anesthesia Type: MAC Level of consciousness: awake and alert Pain management: pain level controlled Vital Signs Assessment: post-procedure vital signs reviewed and stable Respiratory status: spontaneous breathing, nonlabored ventilation, respiratory function stable and patient connected to nasal cannula oxygen Cardiovascular status: stable and blood pressure returned to baseline Postop Assessment: no apparent nausea or vomiting Anesthetic complications: no    Last Vitals:  Vitals:   02/26/18 1624 02/26/18 1700  BP: 103/69 114/64  Pulse: 78 83  Resp: 20 20  Temp: (!) 36.3 C (!) 36.3 C  SpO2: 98% 90%    Last Pain:  Vitals:   02/26/18 1700  TempSrc: Oral  PainSc:                  [name]

## 2018-02-26 NOTE — Progress Notes (Signed)
Nutrition Follow-up  DOCUMENTATION CODES:   Not applicable  INTERVENTION:   -Pro-Stat BID  -Written education materials on dialysis diet left in patient room. RD to provide diet education on follow-up  NUTRITION DIAGNOSIS:   Inadequate oral intake related to inability to eat as evidenced by NPO status.  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 Presents this admission with shortness of breath and coded upon arrival to emergency room. Admitted for cardiogenic pulmonary edema and PEA arrest.   3/19 Extubated 3/25 AV Fistula placed today   Pt out of room on visit today.  Recorded po intake 100% of meals.   Weight trending down since admission; net negative 3 L since admission which explains some of pt's weight loss  Labs: BUN 100, Creatinine 10.89 Meds: reviewed  Diet Order:  Diet NPO time specified Except for: Sips with Meds Diet renal/carb modified with fluid restriction Fluid restriction: 1200 mL Fluid; Room service appropriate? Yes; Fluid consistency: Thin  EDUCATION NEEDS:   Education needs have been addressed  Skin:  Skin Assessment: Reviewed RN Assessment  Last BM:  3/25  Height:   Ht Readings from Last 1 Encounters:  02/17/18 6\' 2"  (1.88 m)    Weight:   Wt Readings from Last 1 Encounters:  02/25/18 187 lb (84.8 kg)    Ideal Body Weight:  86.4 kg  BMI:  Body mass index is 24.01 kg/m.  Estimated Nutritional Needs:   Kcal:  2100-2400 kcals  Protein:  105-125 g  Fluid:  1000 ml + UOP   [employer] MS, RD, LDN, CNSC [phone] Pager  [phone] Weekend/On-Call Pager

## 2018-02-26 NOTE — Progress Notes (Signed)
[facility]                                                                              Patient Demographics  David [name], is a 77 y.o. male, DOB - [birth year], SLH:[id number]  Admit date - 02/17/2018   Admitting Physician Kandice Hams, MD  Outpatient Primary MD for the patient is [name], NP  Outpatient specialists:   LOS - 9  days   Medical records reviewed and are as summarized below:    Chief Complaint  Patient presents with  . 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  On the morning of admission, around 2-3 am, patient woke up with severe shortness of breath. Upon arrival to ED patient was noted to be agonal and pulseless in parking lot. CPR started and continued for estimated 5-10 minutes before return of ROSC. Intubated. CXR consistent with pulmonary edema. Creatinine 5.32, LA 4.44.  Patient was admitted by critical care service.  Creatinine 5.32.   Assessment & Plan    Principal Problem:  Acute respiratory failure with hypoxemia (HCC) with acute pulmonary edema -On arrival to ED, patient was noted to be agonal, pulseless, had PEA asystole arrest, had CPR and ACLS, ROSC 5-10 minutes.  Patient was intubated.  -Extubated on 3/19.  Currently respiratory status stable, O2 sats 99% on room air  Active Problems: PEA, asystole arrest ROSC in 5-10mins, Acute on chronic systolic and diastolic heart failure ([place]), nonischemic cardiomyopathy -Patient's cardiac arrest with neurological recovery.  Cardiology was consulted, patient also underwent HD. -BNP 800, 2D echo showed EF of 15-20%, slightly down from the prior report of  20-25% -Volume management with HD, continue Coreg -Mildly elevated troponin likely secondary to demand ischemia and chest compressions.  EKG showed left bundle branch block, unclear chronicity. -Patient underwent cardiac cath on 3/20 which showed nonobstructive CAD and recommended continue medical therapy    Chronic obstructive pulmonary disease ([place]) -Chest x-ray showed cardiomegaly and perihilar opacities concerning for pulmonary edema -Currently improving, patient has been extubated, maintaining sats 99% on room air. -Continue volume management with HD, flutter valve, pulmonary hygiene    New ESRD (end stage renal disease) ([place]) -Creatinine 5.32 on admission, follows Dr. Posey Pronto in office. -Nephrology, vascular surgery following -HD per nephrology, had fistula placed on 3/25.  Per vascular surgery AV graft appears to be occluded, patient was hypotensive overnight, further management per vascular surgery -Has a right IJ tunneled cath - CLIP process per nephrology    HIV (human immunodeficiency virus infection) ([place]) -Undetectable HIV RNA, CD4 430 -Continue Tivicay and Descovy  Anemia of chronic disease -h/h is currently stable  Prostate cancer status post brachytherapy  Code Status: Full CODE STATUS DVT Prophylaxis: Heparin subcu Family Communication: Discussed in detail with the patient, all imaging results, lab results explained to  the patient    Disposition Plan: Once stable, CLIP process completed   Time Spent in minutes  35 minutes  Procedures:   Intubated 3/17  Echocardiogram 3/18 EF 15-20%  RIJ tunneled catheter placement 3/18  Extubated 3/19  HD initiated 3/19  Heart catheterization 3/20, no culprit lesion  Consultants:   Nephrology  Cardiology  Vascular surgery  Antimicrobials:      Medications  Scheduled Meds: . carvedilol  6.25 mg Oral BID WC  . Chlorhexidine Gluconate Cloth  6 each Topical Daily  . dolutegravir  50 mg Oral Daily  .  emtricitabine-tenofovir AF  1 tablet Oral Daily  . heparin injection (subcutaneous)  5,000 Units Subcutaneous Q8H  . mouth rinse  15 mL Mouth Rinse BID  . scopolamine  1 patch Transdermal Q72H  . sodium chloride flush  10-40 mL Intracatheter Q12H  . sodium chloride flush  3 mL Intravenous Q12H   Continuous Infusions: . sodium chloride Stopped (02/19/18 1100)  . sodium chloride     PRN Meds:.sodium chloride, sodium chloride, acetaminophen, ondansetron (ZOFRAN) IV, oxyCODONE-acetaminophen, sodium chloride flush, sodium chloride flush   Antibiotics   Anti-infectives (From admission, onward)   Start     Dose/Rate Route Frequency Ordered Stop   02/25/18 0600  ceFAZolin (ANCEF) IVPB 2g/100 mL premix     2 g 200 mL/hr over 30 Minutes Intravenous On call to O.R. 02/22/18 1100 02/25/18 0750   02/18/18 1330  ceFAZolin (ANCEF) IVPB 2g/100 mL premix     2 g 200 mL/hr over 30 Minutes Intravenous  Once 02/18/18 1212 02/18/18 1409   02/17/18 1200  dolutegravir (TIVICAY) tablet 50 mg     50 mg Oral Daily 02/17/18 0813     02/17/18 1200  emtricitabine-tenofovir AF (DESCOVY) 200-25 MG per tablet 1 tablet     1 tablet Oral Daily 02/17/18 0813          Subjective:   David [name] was seen and examined today. Feels pain in the l;eft arm.  Patient denies dizziness, chest pain, shortness of breath, abdominal pain, N/V/D/C, new weakness, numbess, tingling. BP overnight soft  Objective:   Vitals:   02/25/18 1946 02/25/18 2223 02/26/18 0350 02/26/18 0758  BP: 102/71  109/71 116/72  Pulse: 85  87 79  Resp: 15  16 16   Temp: 98.4 F (36.9 C)  98.5 F (36.9 C) 97.6 F (36.4 C)  TempSrc:    Oral  SpO2: 96%  100% 99%  Weight:  84.8 kg (187 lb)    Height:        Intake/Output Summary (Last 24 hours) at 02/26/2018 1225 Last data filed at 02/26/2018 0913 Gross per 24 hour  Intake 720 ml  Output 150 ml  Net 570 ml     Wt Readings from Last 3 Encounters:  02/25/18 84.8 kg (187 lb)      Exam  General: Alert and oriented x 3, NAD  Eyes: ,  HEENT:  Atraumatic, normocephalic  Cardiovascular: S1 S2 auscultated, Regular rate and rhythm.  Respiratory: dec BS at bases   Gastrointestinal: Soft, nontender, nondistended, + bowel sounds  Ext: no pedal edema bilaterally, left arm incision clean, no bleeding  Neuro: no new deficits  Musculoskeletal: No digital cyanosis, clubbing  Skin: No rashes  Psych: Normal affect and demeanor, alert and oriented x3    Data Reviewed:  I have personally reviewed following labs and imaging studies  Micro Results Recent Results (from the past 240 hour(s))  Culture, blood (routine x 2)  Status: None   Collection Time: 02/17/18  3:07 AM  Result Value Ref Range Status   Specimen Description BLOOD RIGHT HAND  Final   Special Requests IN PEDIATRIC BOTTLE Blood Culture adequate volume  Final   Culture   Final    NO GROWTH 5 DAYS Performed at [facility], [place] [address], [place], [place] [address]    Report Status 02/22/2018 FINAL  Final  Respiratory Panel by PCR     Status: None   Collection Time: 02/17/18  5:40 AM  Result Value Ref Range Status   Adenovirus NOT DETECTED NOT DETECTED Final   Coronavirus 229E NOT DETECTED NOT DETECTED Final   Coronavirus HKU1 NOT DETECTED NOT DETECTED Final   Coronavirus NL63 NOT DETECTED NOT DETECTED Final   Coronavirus OC43 NOT DETECTED NOT DETECTED Final   Metapneumovirus NOT DETECTED NOT DETECTED Final   Rhinovirus / Enterovirus NOT DETECTED NOT DETECTED Final   Influenza A NOT DETECTED NOT DETECTED Final   Influenza B NOT DETECTED NOT DETECTED Final   Parainfluenza Virus 1 NOT DETECTED NOT DETECTED Final   Parainfluenza Virus 2 NOT DETECTED NOT DETECTED Final   Parainfluenza Virus 3 NOT DETECTED NOT DETECTED Final   Parainfluenza Virus 4 NOT DETECTED NOT DETECTED Final   Respiratory Syncytial Virus NOT DETECTED NOT DETECTED Final   Bordetella pertussis NOT DETECTED NOT  DETECTED Final   Chlamydophila pneumoniae NOT DETECTED NOT DETECTED Final   Mycoplasma pneumoniae NOT DETECTED NOT DETECTED Final    Comment: Performed at [facility], [place] [address] [place], [place] [address]  MRSA PCR Screening     Status: Abnormal   Collection Time: 02/17/18  5:40 AM  Result Value Ref Range Status   MRSA by PCR POSITIVE (A) NEGATIVE Final    Comment:        The GeneXpert MRSA Assay (FDA approved for NASAL specimens only), is one component of a comprehensive MRSA colonization surveillance program. It is not intended to diagnose MRSA infection nor to guide or monitor treatment for MRSA infections. RESULT CALLED TO, READ BACK BY AND VERIFIED WITH: KEISHA @ [id number] ON 02/17/18 BY ROBINSON Z.   Culture, blood (routine x 2)     Status: None   Collection Time: 02/17/18  6:09 AM  Result Value Ref Range Status   Specimen Description BLOOD LEFT HAND  Final   Special Requests   Final    BOTTLES DRAWN AEROBIC ONLY Blood Culture results may not be optimal due to an inadequate volume of blood received in culture bottles   Culture   Final    NO GROWTH 5 DAYS Performed at [facility], [place] [address], [place], [place] [address]    Report Status 02/22/2018 FINAL  Final    Radiology Reports Ct Head Wo Contrast  Result Date: 02/17/2018 CLINICAL DATA:  Altered level of consciousness.  Found unresponsive. EXAM: CT HEAD WITHOUT CONTRAST TECHNIQUE: Contiguous axial images were obtained from the base of the skull through the vertex without intravenous contrast. COMPARISON:  Head CT 07/31/2017, brain MRI 11/01/2017 FINDINGS: Brain: Generalized atrophy and chronic small vessel ischemia, similar to prior exams. No intracranial hemorrhage, mass effect, or midline shift. No hydrocephalus. The basilar cisterns are patent. No evidence of territorial infarct or acute ischemia. No evidence of cerebral edema. No extra-axial or intracranial fluid collection. Vascular: No hyperdense  vessel or unexpected calcification. Skull: No fracture or focal lesion. Sinuses/Orbits: Metallic density in the right globe anterior chamber is unchanged  from prior exam. Bilateral cataract resection. Minimal metallic debris in the anterior scalp. Mild mucosal thickening of the ethmoid air cells and sphenoid sinus. Mastoid air cells are clear. Other: None. IMPRESSION: 1.  No acute intracranial abnormality. 2. Generalized atrophy and chronic small vessel ischemia. 3. Metallic density in the right globe, chronic and unchanged from prior exam. Electronically Signed   By: Jeb Levering M.D.   On: 02/17/2018 05:51   Ct Chest Wo Contrast  Result Date: 02/17/2018 CLINICAL DATA:  Found unresponsive. Post CPR. Acute resp illness, > 60 years old EXAM: CT CHEST WITHOUT CONTRAST TECHNIQUE: Multidetector CT imaging of the chest was performed following the standard protocol without IV contrast. COMPARISON:  Chest radiograph earlier this day. Additional radiographs and CT in a different patient jacket, most recent CT 11/25/2017 FINDINGS: Cardiovascular: Aortic atherosclerosis. Coronary artery calcifications. Mild multi chamber cardiomegaly. Minimal pericardial fluid. Mediastinum/Nodes: Scattered mediastinal nodes not enlarged by size criteria. Lack of IV contrast limits assessment for hilar adenopathy. Endotracheal and enteric tubes appropriately positioned. Lungs/Pleura: Multifocal confluent, ground-glass, patchy and nodular opacities throughout both lungs. Consolidations in the dependent lungs appear confluent, and there is debris within the distal trachea and right mainstem bronchi, suggesting aspiration. No definite septal thickening. No pleural fluid. Upper Abdomen: Tip and side port of the enteric tube in the stomach. Bilateral renal atrophy with hyperdense lesions in both kidneys, likely cysts but incompletely characterized. Musculoskeletal: No acute finding. There are no acute or suspicious osseous abnormalities.  IMPRESSION: 1. Multifocal bilateral consolidative, ground-glass, nodular airspace opacities throughout both lungs. Minimal debris in the distal trachea and right mainstem bronchus. Overall findings suggest pneumonia with probable aspiration. Degree of pulmonary edema is also considered. Ground-glass opacities in a patient with HIV can be seen with pneumocystis pneumonia , however consolidative opacities are the prominent pattern. 2. Cardiomegaly with aortic atherosclerosis. Aortic Atherosclerosis (ICD10-I70.0). Electronically Signed   By: Jeb Levering M.D.   On: 02/17/2018 06:02   Ir Fluoro Guide Cv Line Right  Result Date: 02/18/2018 CLINICAL DATA:  End-stage renal disease, needs durable venous access for hemodialysis EXAM: TUNNELED HEMODIALYSIS CATHETER PLACEMENT WITH ULTRASOUND AND FLUOROSCOPIC GUIDANCE TECHNIQUE: The procedure, risks, benefits, and alternatives were explained to the spouse. Questions regarding the procedure were encouraged and answered. The spouse understands and consents to the procedure. As antibiotic prophylaxis, cefazolin 2 g was ordered pre-procedure and administered intravenously within one hour of incision.Patency of the right IJ vein was confirmed with ultrasound with image documentation. An appropriate skin site was determined. Region was prepped using maximum barrier technique including cap and mask, sterile gown, sterile gloves, large sterile sheet, and Chlorhexidine as cutaneous antisepsis. The region was infiltrated locally with 1% lidocaine. Intravenous Fentanyl and Versed were administered as conscious sedation during continuous monitoring of the patient's level of consciousness and physiological / cardiorespiratory status by the radiology RN, with a total moderate sedation time of 10 minutes. Under real-time ultrasound guidance, the right IJ vein was accessed with a 21 gauge micropuncture needle; the needle tip within the vein was confirmed with ultrasound image  documentation. Needle exchanged over the 018 guidewire for transitional dilator, which allowed advancement of a Benson wire into the IVC. Over this, an MPA catheter was advanced. A Palindrome 23 hemodialysis catheter was tunneled from the right anterior chest wall approach to the right IJ dermatotomy site. The MPA catheter was exchanged over an Amplatz wire for serial vascular dilators which allow placement of a peel-away sheath, through which the catheter was advanced under  intermittent fluoroscopy, positioned with its tips in the proximal and midright atrium. Spot chest radiograph confirms good catheter position. No pneumothorax. Catheter was flushed and primed per protocol. Catheter secured externally with O Prolene sutures. The right IJ dermatotomy site was closed with Dermabond. COMPLICATIONS: COMPLICATIONS None immediate FLUOROSCOPY TIME:  1 minutes; 9 mGy COMPARISON:  None IMPRESSION: 1. Technically successful placement of tunneled right IJ hemodialysis catheter with ultrasound and fluoroscopic guidance. Ready for routine use. ACCESS: Remains approachable for percutaneous intervention as needed. Electronically Signed   By: Lucrezia Europe M.D.   On: 02/18/2018 16:39   Ir US Guide Vasc Access Right  Result Date: 02/18/2018 CLINICAL DATA:  End-stage renal disease, needs durable venous access for hemodialysis EXAM: TUNNELED HEMODIALYSIS CATHETER PLACEMENT WITH ULTRASOUND AND FLUOROSCOPIC GUIDANCE TECHNIQUE: The procedure, risks, benefits, and alternatives were explained to the spouse. Questions regarding the procedure were encouraged and answered. The spouse understands and consents to the procedure. As antibiotic prophylaxis, cefazolin 2 g was ordered pre-procedure and administered intravenously within one hour of incision.Patency of the right IJ vein was confirmed with ultrasound with image documentation. An appropriate skin site was determined. Region was prepped using maximum barrier technique including cap  and mask, sterile gown, sterile gloves, large sterile sheet, and Chlorhexidine as cutaneous antisepsis. The region was infiltrated locally with 1% lidocaine. Intravenous Fentanyl and Versed were administered as conscious sedation during continuous monitoring of the patient's level of consciousness and physiological / cardiorespiratory status by the radiology RN, with a total moderate sedation time of 10 minutes. Under real-time ultrasound guidance, the right IJ vein was accessed with a 21 gauge micropuncture needle; the needle tip within the vein was confirmed with ultrasound image documentation. Needle exchanged over the 018 guidewire for transitional dilator, which allowed advancement of a Benson wire into the IVC. Over this, an MPA catheter was advanced. A Palindrome 23 hemodialysis catheter was tunneled from the right anterior chest wall approach to the right IJ dermatotomy site. The MPA catheter was exchanged over an Amplatz wire for serial vascular dilators which allow placement of a peel-away sheath, through which the catheter was advanced under intermittent fluoroscopy, positioned with its tips in the proximal and midright atrium. Spot chest radiograph confirms good catheter position. No pneumothorax. Catheter was flushed and primed per protocol. Catheter secured externally with O Prolene sutures. The right IJ dermatotomy site was closed with Dermabond. COMPLICATIONS: COMPLICATIONS None immediate FLUOROSCOPY TIME:  1 minutes; 9 mGy COMPARISON:  None IMPRESSION: 1. Technically successful placement of tunneled right IJ hemodialysis catheter with ultrasound and fluoroscopic guidance. Ready for routine use. ACCESS: Remains approachable for percutaneous intervention as needed. Electronically Signed   By: Lucrezia Europe M.D.   On: 02/18/2018 16:39   Dg Chest Port 1 View  Result Date: 02/19/2018 CLINICAL DATA:  Acute on chronic CHF, COPD, respiratory arrest and cardiac arrest. End-stage renal disease, HIV. EXAM:  PORTABLE CHEST 1 VIEW COMPARISON:  Chest x-ray of February 18, 2018 FINDINGS: There has been interval placement of a dual-lumen dialysis catheter whose tip projects at the right cavoatrial junction. The lungs are well-expanded. The interstitial markings are slightly less conspicuous overall today. There is no significant pleural effusion. The cardiac silhouette is enlarged. The pulmonary vascularity is not engorged. The endotracheal tube tip lies 5.9 cm above the carina. The esophagogastric tube tip in proximal port project below the GE junction. IMPRESSION: Interval placement of a dialysis catheter via the right internal jugular approach without postprocedure complication. Stable cardiomegaly with mild improvement  in interstitial edema. The support tubes are in reasonable position. Electronically Signed   By: David  Martinique M.D.   On: 02/19/2018 07:31   Dg Chest Port 1 View  Result Date: 02/18/2018 CLINICAL DATA:  Acute respiratory failure, hypoxia, intubated patient. History of CHF, renal insufficiency, HIV, and prostate malignancy. EXAM: PORTABLE CHEST 1 VIEW COMPARISON:  Chest x-ray and chest CT scan of February 17, 2018 FINDINGS: The lungs are adequately inflated. The interstitial markings remain increased and are more conspicuous today on the left. The cardiac silhouette remains enlarged. The pulmonary vascularity is not engorged. The endotracheal tube tip lies 6.4 cm above the carina. The esophagogastric tube tip and proximal port project below the GE junction. IMPRESSION: Persistent bilateral pneumonia. Slight increased opacity noted on the left today. No overt pulmonary edema. The support tubes are in reasonable position where visualized. Electronically Signed   By: David  Martinique M.D.   On: 02/18/2018 08:35   Dg Chest Portable 1 View  Result Date: 02/17/2018 CLINICAL DATA:  Shortness of breath. Intubation and orogastric tube placement. EXAM: PORTABLE CHEST 1 VIEW COMPARISON:  None. FINDINGS: Endotracheal  tube 7.4 cm from the carina. Enteric tube in place, tip visualized in the stomach. The heart is enlarged. There are bilateral ill-defined perihilar opacities. Minimal Kerley B-lines in the periphery of the right lung. No large pleural effusion or evidence of pneumothorax. Lung apices not included in the field of view. IMPRESSION: 1. Endotracheal tube 7.4 cm from the carina. Enteric tube in place with tip visualized in the stomach. 2. Cardiomegaly. Bilateral ill-defined perihilar opacities, favoring pulmonary edema. Multifocal pneumonia or aspiration could have a similar appearance. Electronically Signed   By: Jeb Levering M.D.   On: 02/17/2018 03:45    Lab Data:  CBC: Recent Labs  Lab 02/22/18 0336 02/23/18 0349 02/24/18 0820 02/25/18 0703 02/25/18 0707 02/26/18 0848  WBC 9.4 8.9 11.3* 10.0  --  11.3*  HGB 11.9* 12.3* 13.8 12.3* 12.6* 12.4*  HCT 36.3* 37.8* 43.0 37.3* 37.0* 37.6*  MCV 100.6* 99.2 100.5* 97.6  --  96.2  PLT 202 207 212 210  --  509   Basic Metabolic Panel: Recent Labs  Lab 02/20/18 0305 02/21/18 0329 02/22/18 0336 02/23/18 0349 02/24/18 0348 02/25/18 0703 02/25/18 0707 02/26/18 0848  NA 139 142 138 135 137  --  136 138  K 3.4* 3.5 4.0 3.8 3.8  --  3.7 4.2  CL 101 103 99* 96* 97*  --   --  101  CO2 24 26 23 22 22   --   --  18*  GLUCOSE 116* 128* 122* 116* 114*  --  108* 114*  BUN 78* 89* 54* 87* 55*  --   --  110*  CREATININE 5.06* 5.05* 4.94* 6.82* 6.59*  --   --  10.89*  CALCIUM 8.9 9.2 9.4 9.4 9.6  --   --  9.1  MG 2.2 2.4 2.3 2.5* 2.4 2.5*  --  2.6*  PHOS 6.0* 4.9*  --   --   --   --   --   --    GFR: Estimated Creatinine Clearance: 6.8 mL/min (A) (by C-G formula based on SCr of 10.89 mg/dL (H)). Liver Function Tests: Recent Labs  Lab 02/21/18 0329  ALBUMIN 3.2*   No results for input(s): LIPASE, AMYLASE in the last 168 hours. No results for input(s): AMMONIA in the last 168 hours. Coagulation Profile: Recent Labs  Lab 02/25/18 0703  INR  1.05   Cardiac  Enzymes: No results for input(s): CKTOTAL, CKMB, CKMBINDEX, TROPONINI in the last 168 hours. BNP (last 3 results) No results for input(s): PROBNP in the last 8760 hours. HbA1C: No results for input(s): HGBA1C in the last 72 hours. CBG: Recent Labs  Lab 02/25/18 1943 02/25/18 2351 02/26/18 0349 02/26/18 0752 02/26/18 1140  GLUCAP 178* 95 104* 134* 124*   Lipid Profile: Recent Labs    02/26/18 0848  TRIG 164*   Thyroid Function Tests: No results for input(s): TSH, T4TOTAL, FREET4, T3FREE, THYROIDAB in the last 72 hours. Anemia Panel: No results for input(s): VITAMINB12, FOLATE, FERRITIN, TIBC, IRON, RETICCTPCT in the last 72 hours. Urine analysis:    Component Value Date/Time   COLORURINE YELLOW 02/17/2018 0540   APPEARANCEUR CLOUDY (A) 02/17/2018 0540   LABSPEC 1.010 02/17/2018 0540   PHURINE 5.0 02/17/2018 0540   GLUCOSEU NEGATIVE 02/17/2018 0540   HGBUR LARGE (A) 02/17/2018 0540   BILIRUBINUR NEGATIVE 02/17/2018 [place] NEGATIVE 02/17/2018 0540   PROTEINUR 100 (A) 02/17/2018 0540   NITRITE NEGATIVE 02/17/2018 0540   LEUKOCYTESUR NEGATIVE 02/17/2018 0540     Ripudeep Rai M.D. [facility] 02/26/2018, 12:25 PM  Pager: [phone] Between 7am to 7pm - call Pager - [phone]  After 7pm go to [email or web address] - password [email or web address]  Call night coverage person covering after 7pm

## 2018-02-26 NOTE — Transfer of Care (Signed)
Immediate Anesthesia Transfer of Care Note  Patient: David [name]  Procedure(s) Performed: THROMBECTOMY ARTERIOVENOUS GRAFT (Left Arm Upper)  Patient Location: PACU  Anesthesia Type:MAC  Level of Consciousness: awake, alert , oriented and patient cooperative  Airway & Oxygen Therapy: Patient Spontanous Breathing and Patient connected to nasal cannula oxygen  Post-op Assessment: Report given to RN, Post -op Vital signs reviewed and stable and Patient moving all extremities X 4  Post vital signs: Reviewed and stable  Last Vitals:  Vitals Value Taken Time  BP 117/84 02/26/2018  3:45 PM  Temp 36.3 C 02/26/2018  3:45 PM  Pulse 80 02/26/2018  3:47 PM  Resp 16 02/26/2018  3:47 PM  SpO2 99 % 02/26/2018  3:47 PM  Vitals shown include unvalidated device data.  Last Pain:  Vitals:   02/26/18 0920  TempSrc:   PainSc: 3       Patients Stated Pain Goal: 1 (68/03/21 2248)  Complications: No apparent anesthesia complications

## 2018-02-26 NOTE — Interval H&P Note (Signed)
History and Physical Interval Note:  02/26/2018 1:46 PM  David [name]  has presented today for surgery, with the diagnosis of renal failure  The various methods of treatment have been discussed with the patient and family. After consideration of risks, benefits and other options for treatment, the patient has consented to  Procedure(s): THROMBECTOMY ARTERIOVENOUS GRAFT (Left) as a surgical intervention .  The patient's history has been reviewed, patient examined, no change in status, stable for surgery.  I have reviewed the patient's chart and labs.  Questions were answered to the patient's satisfaction.     [name]

## 2018-02-26 NOTE — H&P (View-Only) (Signed)
  Progress Note    02/26/2018 9:56 AM 1 Day Post-Op  Subjective:  Denies excessive pain L arm   Vitals:   02/26/18 0350 02/26/18 0758  BP: 109/71 116/72  Pulse: 87 79  Resp: 16 16  Temp: 98.5 F (36.9 C) 97.6 F (36.4 C)  SpO2: 100% 99%   Physical Exam: Lungs:  Non labored Incisions:  L arm axilla and AC fossa incision without hematoma or bleeding; no palpable thrill or audible bruit L arm AV graft; palpable L radial pulse; R IJ TDC site unremarkable Abdomen:  Soft Neurologic: A&O  CBC    Component Value Date/Time   WBC 11.3 (H) 02/26/2018 0848   RBC 3.91 (L) 02/26/2018 0848   HGB 12.4 (L) 02/26/2018 0848   HCT 37.6 (L) 02/26/2018 0848   PLT 223 02/26/2018 0848   MCV 96.2 02/26/2018 0848   MCH 31.7 02/26/2018 0848   MCHC 33.0 02/26/2018 0848   RDW 13.0 02/26/2018 0848   LYMPHSABS 0.7 02/17/2018 1020   MONOABS 0.5 02/17/2018 1020   EOSABS 0.0 02/17/2018 1020   BASOSABS 0.0 02/17/2018 1020    BMET    Component Value Date/Time   NA 138 02/26/2018 0848   K 4.2 02/26/2018 0848   CL 101 02/26/2018 0848   CO2 18 (L) 02/26/2018 0848   GLUCOSE 114 (H) 02/26/2018 0848   BUN 110 (H) 02/26/2018 0848   CREATININE 10.89 (H) 02/26/2018 0848   CALCIUM 9.1 02/26/2018 0848   GFRNONAA 4 (L) 02/26/2018 0848   GFRAA 5 (L) 02/26/2018 0848    INR    Component Value Date/Time   INR 1.05 02/25/2018 0703     Intake/Output Summary (Last 24 hours) at 02/26/2018 0956 Last data filed at 02/26/2018 0913 Gross per 24 hour  Intake 920 ml  Output 150 ml  Net 770 ml     Assessment/Plan:  77 y.o. male is s/p L arm AV graft 1 Day Post-Op   L arm AV graft occluded by my examination; Pt hypotensive overnight, unsure if this is a contributing factor Treatment plan will be discussed with Dr. Scot Dock HD via Retta Diones [place], PA-C [facility] [phone] 02/26/2018 9:56 AM

## 2018-02-26 NOTE — Progress Notes (Signed)
  Progress Note    02/26/2018 9:56 AM 1 Day Post-Op  Subjective:  Denies excessive pain L arm   Vitals:   02/26/18 0350 02/26/18 0758  BP: 109/71 116/72  Pulse: 87 79  Resp: 16 16  Temp: 98.5 F (36.9 C) 97.6 F (36.4 C)  SpO2: 100% 99%   Physical Exam: Lungs:  Non labored Incisions:  L arm axilla and AC fossa incision without hematoma or bleeding; no palpable thrill or audible bruit L arm AV graft; palpable L radial pulse; R IJ TDC site unremarkable Abdomen:  Soft Neurologic: A&O  CBC    Component Value Date/Time   WBC 11.3 (H) 02/26/2018 0848   RBC 3.91 (L) 02/26/2018 0848   HGB 12.4 (L) 02/26/2018 0848   HCT 37.6 (L) 02/26/2018 0848   PLT 223 02/26/2018 0848   MCV 96.2 02/26/2018 0848   MCH 31.7 02/26/2018 0848   MCHC 33.0 02/26/2018 0848   RDW 13.0 02/26/2018 0848   LYMPHSABS 0.7 02/17/2018 1020   MONOABS 0.5 02/17/2018 1020   EOSABS 0.0 02/17/2018 1020   BASOSABS 0.0 02/17/2018 1020    BMET    Component Value Date/Time   NA 138 02/26/2018 0848   K 4.2 02/26/2018 0848   CL 101 02/26/2018 0848   CO2 18 (L) 02/26/2018 0848   GLUCOSE 114 (H) 02/26/2018 0848   BUN 110 (H) 02/26/2018 0848   CREATININE 10.89 (H) 02/26/2018 0848   CALCIUM 9.1 02/26/2018 0848   GFRNONAA 4 (L) 02/26/2018 0848   GFRAA 5 (L) 02/26/2018 0848    INR    Component Value Date/Time   INR 1.05 02/25/2018 0703     Intake/Output Summary (Last 24 hours) at 02/26/2018 0956 Last data filed at 02/26/2018 0913 Gross per 24 hour  Intake 920 ml  Output 150 ml  Net 770 ml     Assessment/Plan:  77 y.o. male is s/p L arm AV graft 1 Day Post-Op   L arm AV graft occluded by my examination; Pt hypotensive overnight, unsure if this is a contributing factor Treatment plan will be discussed with Dr. Scot Dock HD via Retta Diones [place], PA-C [facility] [phone] 02/26/2018 9:56 AM

## 2018-02-26 NOTE — Anesthesia Preprocedure Evaluation (Signed)
Anesthesia Evaluation  Patient identified by MRN, date of birth, ID band Patient awake    Reviewed: Allergy & Precautions, NPO status , Patient's Chart, lab work & pertinent test results  Airway Mallampati: I  TM Distance: >3 FB Neck ROM: Full    Dental   Pulmonary COPD, former smoker,    Pulmonary exam normal        Cardiovascular hypertension, Pt. on medications +CHF  Normal cardiovascular exam     Neuro/Psych    GI/Hepatic   Endo/Other    Renal/GU ESRF and DialysisRenal disease     Musculoskeletal   Abdominal   Peds  Hematology  (+) HIV,   Anesthesia Other Findings   Reproductive/Obstetrics                             Anesthesia Physical Anesthesia Plan  ASA: III  Anesthesia Plan: MAC   Post-op Pain Management:    Induction: Intravenous  PONV Risk Score and Plan: 1  Airway Management Planned: Simple Face Mask  Additional Equipment:   Intra-op Plan:   Post-operative Plan:   Informed Consent: I have reviewed the patients History and Physical, chart, labs and discussed the procedure including the risks, benefits and alternatives for the proposed anesthesia with the patient or authorized representative who has indicated his/her understanding and acceptance.     Plan Discussed with: CRNA and Surgeon  Anesthesia Plan Comments:         Anesthesia Quick Evaluation

## 2018-02-27 ENCOUNTER — Encounter (HOSPITAL_COMMUNITY): Payer: Self-pay | Admitting: Vascular Surgery

## 2018-02-27 LAB — GLUCOSE, CAPILLARY
GLUCOSE-CAPILLARY: 95 mg/dL (ref 65–99)
GLUCOSE-CAPILLARY: 94 mg/dL (ref 65–99)
GLUCOSE-CAPILLARY: 105 mg/dL — AB (ref 65–99)
Glucose-Capillary: 110 mg/dL — ABNORMAL HIGH (ref 65–99)

## 2018-02-27 LAB — RENAL FUNCTION PANEL
Albumin: 3.1 g/dL — ABNORMAL LOW (ref 3.5–5.0)
Anion gap: 18 — ABNORMAL HIGH (ref 5–15)
BUN: 125 mg/dL — ABNORMAL HIGH (ref 6–20)
CALCIUM: 8.7 mg/dL — AB (ref 8.9–10.3)
CO2: 19 mmol/L — ABNORMAL LOW (ref 22–32)
CREATININE: 11.4 mg/dL — AB (ref 0.61–1.24)
Chloride: 103 mmol/L (ref 101–111)
GFR, EST AFRICAN AMERICAN: 4 mL/min — AB (ref >60)
GFR, EST NON AFRICAN AMERICAN: 4 mL/min — AB (ref >60)
Glucose, Bld: 116 mg/dL — ABNORMAL HIGH (ref 65–99)
Phosphorus: 11.8 mg/dL — ABNORMAL HIGH (ref 2.5–4.6)
Potassium: 4.2 mmol/L (ref 3.5–5.1)
Sodium: 140 mmol/L (ref 135–145)

## 2018-02-27 LAB — CBC
HCT: 33.9 % — ABNORMAL LOW (ref 39.0–52.0)
Hemoglobin: 10.9 g/dL — ABNORMAL LOW (ref 13.0–17.0)
MCH: 31.4 pg (ref 26.0–34.0)
MCHC: 32.2 g/dL (ref 30.0–36.0)
MCV: 97.7 fL (ref 78.0–100.0)
PLATELETS: 217 10*3/uL (ref 150–400)
RBC: 3.47 MIL/uL — AB (ref 4.22–5.81)
RDW: 13 % (ref 11.5–15.5)
WBC: 8.8 10*3/uL (ref 4.0–10.5)

## 2018-02-27 LAB — MAGNESIUM: MAGNESIUM: 2.6 mg/dL — AB (ref 1.7–2.4)

## 2018-02-27 MED ORDER — LIDOCAINE-PRILOCAINE 2.5-2.5 % EX CREA: 1.0000 "application " | TOPICAL_CREAM | CUTANEOUS | Status: DC | PRN

## 2018-02-27 MED ORDER — MAGNESIUM HYDROXIDE 400 MG/5ML PO SUSP: 30.0000 mL | Freq: Every day | ORAL | Status: DC | PRN

## 2018-02-27 MED ORDER — ALTEPLASE 2 MG IJ SOLR
INTRAMUSCULAR | Status: AC
  Administered 2018-02-27: 2 mg
  Filled 2018-02-27: qty 4

## 2018-02-27 MED ORDER — POLYETHYLENE GLYCOL 3350 17 G PO PACK
17.0000 g | PACK | Freq: Every day | ORAL | Status: DC | PRN
  Administered 2018-02-27 – 2018-03-08 (×3): 17 g via ORAL
  Filled 2018-02-27 (×3): qty 1

## 2018-02-27 MED ORDER — SODIUM CHLORIDE 0.9 % IV SOLN: 100.0000 mL | INTRAVENOUS | Status: DC | PRN

## 2018-02-27 MED ORDER — PENTAFLUOROPROP-TETRAFLUOROETH EX AERO: 1.0000 "application " | INHALATION_SPRAY | CUTANEOUS | Status: DC | PRN

## 2018-02-27 MED ORDER — HEPARIN SODIUM (PORCINE) 1000 UNIT/ML DIALYSIS: 1000.0000 [IU] | INTRAMUSCULAR | Status: DC | PRN

## 2018-02-27 MED ORDER — LIDOCAINE HCL (PF) 1 % IJ SOLN: 5.0000 mL | INTRAMUSCULAR | Status: DC | PRN

## 2018-02-27 MED ORDER — ALTEPLASE 2 MG IJ SOLR
2.0000 mg | Freq: Once | INTRAMUSCULAR | Status: AC | PRN
  Administered 2018-02-27: 2 mg

## 2018-02-27 NOTE — Progress Notes (Addendum)
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  In ED patient was noted to be agonal and pulseless in parking lot. CPR started and continued for estimated 5-10 minutes before return of ROSC. Intubated. CXR consistent with pulmonary edema. Creatinine 5.32, LA 4.44. PCCM asked to admit.  He has been started on dialysis and is undergoing the CLIP process     Assessment/ Plan:    ESRD- new ESRD  Undergoing CLIP process     - Plan dialysis on MWF schedule - Seen on HD today at ~1018am; cannot get blood flow above 120ml/min - Will take off to prevent filter from clotting + cathflo -> retry tomorrow. If still not working then will need a catheter exchange.  ANEMIA- Hb 11.9   MBD- difficulty tolerating phoslo  Will stop at this point  Phos in 4 range we will follow  HTN/VOL-  Controlled   ACCESS-  LUA AVG declot on 3/26; placed on 3/25  OTHER- EF 20 %   HIV   antiretrovirals     Subjective:   Feels better and is ambulating w/ a great appetite. Denies f/c/n. CP described as dull and worse with deep inspiration   Objective:   BP (!) 89/57   Pulse 90   Temp (!) 97.4 F (36.3 C) (Oral)   Resp 20   Ht 6\' 2"  (1.88 m)   Wt 85.9 kg (189 lb 6 oz)   SpO2 98%   BMI 24.31 kg/m   Intake/Output Summary (Last 24 hours) at 02/27/2018 1025 Last data filed at 02/27/2018 0539 Gross per 24 hour  Intake 1170 ml  Output 160 ml  Net 1010 ml   Weight change:   Physical Exam: General appearance: alert, no distress and sitting up in a chair Lungs: clear to auscultation bilaterally Heart: regular rate and rhythm and no murmur Abdomen: soft, non-tender; bowel sounds normal; no masses, no organomegaly Extremities: extremities normal, atraumatic, no cyanosis or edema Pulses: 2+ and symmetric Neurologic: Mental status:  Alert, oriented, thought content appropriate Access: RIJ TC, LUA AVG strong bruit     Imaging: No results found.  Labs: BMET Recent Labs  Lab 02/21/18 0329 02/22/18 0336 02/23/18 0349 02/24/18 0348 02/25/18 0707 02/26/18 0848 02/27/18 0855  NA 142 138 135 137 136 138 140  K 3.5 4.0 3.8 3.8 3.7 4.2 4.2  CL 103 99* 96* 97*  --  101 103  CO2 26 23 22 22   --  18* 19*  GLUCOSE 128* 122* 116* 114* 108* 114* 116*  BUN 89* 54* 87* 55*  --  110* 125*  CREATININE 5.05* 4.94* 6.82* 6.59*  --  10.89* 11.40*  CALCIUM 9.2 9.4 9.4 9.6  --  9.1 8.7*  PHOS 4.9*  --   --   --   --   --  11.8*   CBC Recent Labs  Lab 02/24/18 0820 02/25/18 0703 02/25/18 0707 02/26/18 0848 02/27/18 0855  WBC 11.3* 10.0  --  11.3* 8.8  HGB 13.8 12.3* 12.6* 12.4* 10.9*  HCT 43.0 37.3* 37.0* 37.6* 33.9*  MCV 100.5* 97.6  --  96.2 97.7  PLT 212 210  --  223 217    Medications:    . carvedilol  6.25 mg Oral BID WC  . Chlorhexidine Gluconate Cloth  6 each Topical Daily  . dolutegravir  50 mg Oral Daily  . emtricitabine-tenofovir AF  1 tablet Oral Daily  .  feeding supplement (PRO-STAT SUGAR FREE 64)  30 mL Oral BID  . heparin injection (subcutaneous)  5,000 Units Subcutaneous Q8H  . mouth rinse  15 mL Mouth Rinse BID  . scopolamine  1 patch Transdermal Q72H  . sodium chloride flush  10-40 mL Intracatheter Q12H  . sodium chloride flush  3 mL Intravenous Q12H      Otelia Santee, MD 02/27/2018, 10:25 AM

## 2018-02-27 NOTE — Progress Notes (Signed)
Pt had HD tx for 1.25hrs only d/t catheter not working well. Nephrologist ordered to stop tx and instill cathflo. Pt will be back tomorrow for HD tx. Pt is not in any acute distress

## 2018-02-27 NOTE — Progress Notes (Signed)
   VASCULAR SURGERY ASSESSMENT & PLAN:   1 Day Post-Op s/p: Thrombectomy of left upper arm AV graft  The graft is patent with a weak bruit. If the graft fails I suspect this is related to his blood pressure issues and low ejection fraction.  He has a functioning dialysis catheter.  Vascular surgery will be available as needed.  SUBJECTIVE:   No specific complaints.  PHYSICAL EXAM:   Vitals:   02/26/18 1624 02/26/18 1700 02/26/18 2216 02/27/18 0538  BP: 103/69 114/64 120/80 122/78  Pulse: 78 83 90 85  Resp: 20 20 18 18   Temp: (!) 97.3 F (36.3 C) (!) 97.4 F (36.3 C) 98.1 F (36.7 C) 98.3 F (36.8 C)  TempSrc:  Oral Oral Oral  SpO2: 98% 90% 100% 100%  Weight:      Height:       Weak bruit in the left upper arm AV graft Incisions look fine Palpable left radial pulse  LABS:   Lab Results  Component Value Date   WBC 11.3 (H) 02/26/2018   HGB 12.4 (L) 02/26/2018   HCT 37.6 (L) 02/26/2018   MCV 96.2 02/26/2018   PLT 223 02/26/2018   Lab Results  Component Value Date   CREATININE 10.89 (H) 02/26/2018   Lab Results  Component Value Date   INR 1.05 02/25/2018   CBG (last 3)  Recent Labs    02/26/18 1140 02/26/18 1640 02/26/18 2139  GLUCAP 124* 104* 161*    PROBLEM LIST:    Principal Problem:   HIV (human immunodeficiency virus infection) (David [name]) Active Problems:   Respiratory arrest (David [name])   Acute pulmonary edema (David [name])   Acute on chronic systolic heart failure (David [name])   Chronic obstructive pulmonary disease (David [name])   Cardiac arrest (David [name])   Acute on chronic systolic and diastolic heart failure, NYHA class 4 (David [name])   ESRD (end stage renal disease) (David [name])   Acute respiratory failure with hypoxemia (David [name])   CURRENT MEDS:   . carvedilol  6.25 mg Oral BID WC  . Chlorhexidine Gluconate Cloth  6 each Topical Daily  . dolutegravir  50 mg Oral Daily  . emtricitabine-tenofovir AF  1 tablet Oral Daily  . feeding supplement (PRO-STAT SUGAR FREE 64)  30 mL Oral BID    . heparin injection (subcutaneous)  5,000 Units Subcutaneous Q8H  . mouth rinse  15 mL Mouth Rinse BID  . scopolamine  1 patch Transdermal Q72H  . sodium chloride flush  10-40 mL Intracatheter Q12H  . sodium chloride flush  3 mL Intravenous Q12H    [name] Beeper: [phone] Office: [phone] 02/27/2018

## 2018-02-27 NOTE — Progress Notes (Signed)
[facility]                                                                              Patient Demographics  David [name], is a 77 y.o. male, DOB - 1941/02/17, BHA:[id number]  Admit date - 02/17/2018   Admitting Physician Kandice Hams, MD  Outpatient Primary MD for the patient is [name], NP  Outpatient specialists:   LOS - 10  days   Medical records reviewed and are as summarized below:    Chief Complaint  Patient presents with  . 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  On the morning of admission, around 2-3 am, patient woke up with severe shortness of breath. Upon arrival to ED patient was noted to be agonal and pulseless in parking lot. CPR started and continued for estimated 5-10 minutes before return of ROSC. Intubated. CXR consistent with pulmonary edema. Creatinine 5.32, LA 4.44.  Patient was admitted by critical care service.  Creatinine 5.32.   Assessment & Plan    Principal Problem:  Acute respiratory failure with hypoxemia (HCC) with acute pulmonary edema -On arrival to ED, patient was noted to be agonal, pulseless, had PEA asystole arrest, had CPR and ACLS, ROSC 5-10 minutes.  Patient was intubated.  -Extubated on 3/19.  Currently respiratory status stable, O2 sats 95% on room air Active Problems: PEA, asystole arrest ROSC in 5-10mins, Acute on chronic systolic and diastolic heart failure ([place]), nonischemic cardiomyopathy -Patient's cardiac arrest with neurological recovery.  Cardiology was consulted, patient also underwent HD. -BNP 800, 2D echo showed EF of 15-20%, slightly down from the prior report of  20-25% -Volume management with HD, continue Coreg -Mildly elevated troponin likely secondary to demand ischemia and chest compressions.  EKG showed left bundle branch block, unclear chronicity. -Patient underwent cardiac cath on 3/20 which showed nonobstructive CAD and recommended continue medical therapy    Chronic obstructive pulmonary disease ([place]) -Chest x-ray showed cardiomegaly and perihilar opacities concerning for pulmonary edema -Currently improving, patient has been extubated, on room air -Continue volume management with HD, flutter valve, pulmonary hygiene    New ESRD (end stage renal disease) ([place]) -Creatinine 5.32 on admission, follows Dr. Posey Pronto in office. -Nephrology, vascular surgery following -HD per nephrology, had fistula placed on 3/25.  Per vascular surgery AV graft appears to be occluded, patient was hypotensive overnight, further management per vascular surgery -Has a right IJ tunneled cath, hemodialysis stopped today as catheter was not working - CLIP process per nephrology    HIV (human immunodeficiency virus infection) ([place]) -Undetectable HIV RNA, CD4 27 -Continue T[place]ay and Descovy  Anemia of chronic disease -H&H currently stable  Prostate cancer status post brachytherapy  Code Status: Full CODE STATUS DVT Prophylaxis: Heparin subcu Family Communication: Discussed in detail with the patient, all imaging results, lab  results explained to the patient    Disposition Plan: Once stable and CLIP process completed   Time Spent in minutes  25 minutes  Procedures:   Intubated 3/17  Echocardiogram 3/18 EF 15-20%  RIJ tunneled catheter placement 3/18  Extubated 3/19  HD initiated 3/19  Heart catheterization 3/20, no culprit lesion  Consultants:   Nephrology  Cardiology  Vascular surgery  Antimicrobials:      Medications  Scheduled Meds: . carvedilol  6.25 mg Oral BID WC  . Chlorhexidine Gluconate Cloth  6 each Topical Daily  .  dolutegravir  50 mg Oral Daily  . emtricitabine-tenofovir AF  1 tablet Oral Daily  . feeding supplement (PRO-STAT SUGAR FREE 64)  30 mL Oral BID  . mouth rinse  15 mL Mouth Rinse BID  . scopolamine  1 patch Transdermal Q72H  . sodium chloride flush  10-40 mL Intracatheter Q12H  . sodium chloride flush  3 mL Intravenous Q12H   Continuous Infusions: . sodium chloride    . sodium chloride     PRN Meds:.sodium chloride, acetaminophen, ondansetron (ZOFRAN) IV, oxyCODONE-acetaminophen, sodium chloride flush, sodium chloride flush   Antibiotics   Anti-infectives (From admission, onward)   Start     Dose/Rate Route Frequency Ordered Stop   02/25/18 0600  ceFAZolin (ANCEF) IVPB 2g/100 mL premix     2 g 200 mL/hr over 30 Minutes Intravenous On call to O.R. 02/22/18 1100 02/25/18 0750   02/18/18 1330  ceFAZolin (ANCEF) IVPB 2g/100 mL premix     2 g 200 mL/hr over 30 Minutes Intravenous  Once 02/18/18 1212 02/18/18 1409   02/17/18 1200  dolutegravir (TIVICAY) tablet 50 mg     50 mg Oral Daily 02/17/18 0813     02/17/18 1200  emtricitabine-tenofovir AF (DESCOVY) 200-25 MG per tablet 1 tablet     1 tablet Oral Daily 02/17/18 0813          Subjective:   David [name] was seen and examined today.  No complaints per the patient, afebrile.  Dialysis catheter not working. Patient denies dizziness, shortness of breath, abdominal pain, N/V/D/C, new weakness, numbess, tingling.  BP soft overnight  Objective:   Vitals:   02/27/18 0915 02/27/18 0945 02/27/18 1005 02/27/18 1052  BP: (!) 112/58 (!) 89/57 103/64 103/66  Pulse: 76 90 78 83  Resp:   (!) 24 20  Temp:   97.6 F (36.4 C) 97.8 F (36.6 C)  TempSrc:   Oral Oral  SpO2:   98% 95%  Weight:   85.4 kg (188 lb 4.4 oz)   Height:        Intake/Output Summary (Last 24 hours) at 02/27/2018 1500 Last data filed at 02/27/2018 1429 Gross per 24 hour  Intake 1410 ml  Output 514 ml  Net 896 ml     Wt Readings from Last 3 Encounters:    02/27/18 85.4 kg (188 lb 4.4 oz)     Exam General: Alert and oriented x 3, NAD Eyes:  HEENT:   Cardiovascular: S1 S2 clear.  RRR no pedal edema b/l Respiratory: CTA anteriorly Gastrointestinal: Soft, nontender, nondistended, + bowel sounds Ext: no pedal edema bilaterally Neuro: no new deficits Musculoskeletal: No digital cyanosis, clubbing Skin: Left arm incision clean Psych: Normal affect and demeanor, alert and oriented x3      Data Reviewed:  I have personally reviewed following labs and imaging studies  Micro Results No results found for this or any previous visit (from the past 240 hour(s)).  Radiology Reports Ct Head Wo Contrast  Result Date: 02/17/2018 CLINICAL DATA:  Altered level of consciousness.  Found unresponsive. EXAM: CT HEAD WITHOUT CONTRAST TECHNIQUE: Contiguous axial images were obtained from the base of the skull through the vertex without intravenous contrast. COMPARISON:  Head CT 07/31/2017, brain MRI 11/01/2017 FINDINGS: Brain: Generalized atrophy and chronic small vessel ischemia, similar to prior exams. No intracranial hemorrhage, mass effect, or midline shift. No hydrocephalus. The basilar cisterns are patent. No evidence of territorial infarct or acute ischemia. No evidence of cerebral edema. No extra-axial or intracranial fluid collection. Vascular: No hyperdense vessel or unexpected calcification. Skull: No fracture or focal lesion. Sinuses/Orbits: Metallic density in the right globe anterior chamber is unchanged from prior exam. Bilateral cataract resection. Minimal metallic debris in the anterior scalp. Mild mucosal thickening of the ethmoid air cells and sphenoid sinus. Mastoid air cells are clear. Other: None. IMPRESSION: 1.  No acute intracranial abnormality. 2. Generalized atrophy and chronic small vessel ischemia. 3. Metallic density in the right globe, chronic and unchanged from prior exam. Electronically Signed   By: Jeb Levering M.D.   On:  02/17/2018 05:51   Ct Chest Wo Contrast  Result Date: 02/17/2018 CLINICAL DATA:  Found unresponsive. Post CPR. Acute resp illness, > 36 years old EXAM: CT CHEST WITHOUT CONTRAST TECHNIQUE: Multidetector CT imaging of the chest was performed following the standard protocol without IV contrast. COMPARISON:  Chest radiograph earlier this day. Additional radiographs and CT in a different patient jacket, most recent CT 11/25/2017 FINDINGS: Cardiovascular: Aortic atherosclerosis. Coronary artery calcifications. Mild multi chamber cardiomegaly. Minimal pericardial fluid. Mediastinum/Nodes: Scattered mediastinal nodes not enlarged by size criteria. Lack of IV contrast limits assessment for hilar adenopathy. Endotracheal and enteric tubes appropriately positioned. Lungs/Pleura: Multifocal confluent, ground-glass, patchy and nodular opacities throughout both lungs. Consolidations in the dependent lungs appear confluent, and there is debris within the distal trachea and right mainstem bronchi, suggesting aspiration. No definite septal thickening. No pleural fluid. Upper Abdomen: Tip and side port of the enteric tube in the stomach. Bilateral renal atrophy with hyperdense lesions in both kidneys, likely cysts but incompletely characterized. Musculoskeletal: No acute finding. There are no acute or suspicious osseous abnormalities. IMPRESSION: 1. Multifocal bilateral consolidative, ground-glass, nodular airspace opacities throughout both lungs. Minimal debris in the distal trachea and right mainstem bronchus. Overall findings suggest pneumonia with probable aspiration. Degree of pulmonary edema is also considered. Ground-glass opacities in a patient with HIV can be seen with pneumocystis pneumonia , however consolidative opacities are the prominent pattern. 2. Cardiomegaly with aortic atherosclerosis. Aortic Atherosclerosis (ICD10-I70.0). Electronically Signed   By: Jeb Levering M.D.   On: 02/17/2018 06:02   Ir Fluoro  Guide Cv Line Right  Result Date: 02/18/2018 CLINICAL DATA:  End-stage renal disease, needs durable venous access for hemodialysis EXAM: TUNNELED HEMODIALYSIS CATHETER PLACEMENT WITH ULTRASOUND AND FLUOROSCOPIC GUIDANCE TECHNIQUE: The procedure, risks, benefits, and alternatives were explained to the spouse. Questions regarding the procedure were encouraged and answered. The spouse understands and consents to the procedure. As antibiotic prophylaxis, cefazolin 2 g was ordered pre-procedure and administered intravenously within one hour of incision.Patency of the right IJ vein was confirmed with ultrasound with image documentation. An appropriate skin site was determined. Region was prepped using maximum barrier technique including cap and mask, sterile gown, sterile gloves, large sterile sheet, and Chlorhexidine as cutaneous antisepsis. The region was infiltrated locally with 1% lidocaine. Intravenous Fentanyl and Versed were administered as conscious sedation during continuous monitoring of the patient's  level of consciousness and physiological / cardiorespiratory status by the radiology RN, with a total moderate sedation time of 10 minutes. Under real-time ultrasound guidance, the right IJ vein was accessed with a 21 gauge micropuncture needle; the needle tip within the vein was confirmed with ultrasound image documentation. Needle exchanged over the 018 guidewire for transitional dilator, which allowed advancement of a Benson wire into the IVC. Over this, an MPA catheter was advanced. A Palindrome 23 hemodialysis catheter was tunneled from the right anterior chest wall approach to the right IJ dermatotomy site. The MPA catheter was exchanged over an Amplatz wire for serial vascular dilators which allow placement of a peel-away sheath, through which the catheter was advanced under intermittent fluoroscopy, positioned with its tips in the proximal and midright atrium. Spot chest radiograph confirms good catheter  position. No pneumothorax. Catheter was flushed and primed per protocol. Catheter secured externally with O Prolene sutures. The right IJ dermatotomy site was closed with Dermabond. COMPLICATIONS: COMPLICATIONS None immediate FLUOROSCOPY TIME:  1 minutes; 9 mGy COMPARISON:  None IMPRESSION: 1. Technically successful placement of tunneled right IJ hemodialysis catheter with ultrasound and fluoroscopic guidance. Ready for routine use. ACCESS: Remains approachable for percutaneous intervention as needed. Electronically Signed   By: Lucrezia Europe M.D.   On: 02/18/2018 16:39   Ir US Guide Vasc Access Right  Result Date: 02/18/2018 CLINICAL DATA:  End-stage renal disease, needs durable venous access for hemodialysis EXAM: TUNNELED HEMODIALYSIS CATHETER PLACEMENT WITH ULTRASOUND AND FLUOROSCOPIC GUIDANCE TECHNIQUE: The procedure, risks, benefits, and alternatives were explained to the spouse. Questions regarding the procedure were encouraged and answered. The spouse understands and consents to the procedure. As antibiotic prophylaxis, cefazolin 2 g was ordered pre-procedure and administered intravenously within one hour of incision.Patency of the right IJ vein was confirmed with ultrasound with image documentation. An appropriate skin site was determined. Region was prepped using maximum barrier technique including cap and mask, sterile gown, sterile gloves, large sterile sheet, and Chlorhexidine as cutaneous antisepsis. The region was infiltrated locally with 1% lidocaine. Intravenous Fentanyl and Versed were administered as conscious sedation during continuous monitoring of the patient's level of consciousness and physiological / cardiorespiratory status by the radiology RN, with a total moderate sedation time of 10 minutes. Under real-time ultrasound guidance, the right IJ vein was accessed with a 21 gauge micropuncture needle; the needle tip within the vein was confirmed with ultrasound image documentation. Needle  exchanged over the 018 guidewire for transitional dilator, which allowed advancement of a Benson wire into the IVC. Over this, an MPA catheter was advanced. A Palindrome 23 hemodialysis catheter was tunneled from the right anterior chest wall approach to the right IJ dermatotomy site. The MPA catheter was exchanged over an Amplatz wire for serial vascular dilators which allow placement of a peel-away sheath, through which the catheter was advanced under intermittent fluoroscopy, positioned with its tips in the proximal and midright atrium. Spot chest radiograph confirms good catheter position. No pneumothorax. Catheter was flushed and primed per protocol. Catheter secured externally with O Prolene sutures. The right IJ dermatotomy site was closed with Dermabond. COMPLICATIONS: COMPLICATIONS None immediate FLUOROSCOPY TIME:  1 minutes; 9 mGy COMPARISON:  None IMPRESSION: 1. Technically successful placement of tunneled right IJ hemodialysis catheter with ultrasound and fluoroscopic guidance. Ready for routine use. ACCESS: Remains approachable for percutaneous intervention as needed. Electronically Signed   By: Lucrezia Europe M.D.   On: 02/18/2018 16:39   Dg Chest Port 1 View  Result Date: 02/19/2018  CLINICAL DATA:  Acute on chronic CHF, COPD, respiratory arrest and cardiac arrest. End-stage renal disease, HIV. EXAM: PORTABLE CHEST 1 VIEW COMPARISON:  Chest x-ray of February 18, 2018 FINDINGS: There has been interval placement of a dual-lumen dialysis catheter whose tip projects at the right cavoatrial junction. The lungs are well-expanded. The interstitial markings are slightly less conspicuous overall today. There is no significant pleural effusion. The cardiac silhouette is enlarged. The pulmonary vascularity is not engorged. The endotracheal tube tip lies 5.9 cm above the carina. The esophagogastric tube tip in proximal port project below the GE junction. IMPRESSION: Interval placement of a dialysis catheter via the  right internal jugular approach without postprocedure complication. Stable cardiomegaly with mild improvement in interstitial edema. The support tubes are in reasonable position. Electronically Signed   By: David  Martinique M.D.   On: 02/19/2018 07:31   Dg Chest Port 1 View  Result Date: 02/18/2018 CLINICAL DATA:  Acute respiratory failure, hypoxia, intubated patient. History of CHF, renal insufficiency, HIV, and prostate malignancy. EXAM: PORTABLE CHEST 1 VIEW COMPARISON:  Chest x-ray and chest CT scan of February 17, 2018 FINDINGS: The lungs are adequately inflated. The interstitial markings remain increased and are more conspicuous today on the left. The cardiac silhouette remains enlarged. The pulmonary vascularity is not engorged. The endotracheal tube tip lies 6.4 cm above the carina. The esophagogastric tube tip and proximal port project below the GE junction. IMPRESSION: Persistent bilateral pneumonia. Slight increased opacity noted on the left today. No overt pulmonary edema. The support tubes are in reasonable position where visualized. Electronically Signed   By: David  Martinique M.D.   On: 02/18/2018 08:35   Dg Chest Portable 1 View  Result Date: 02/17/2018 CLINICAL DATA:  Shortness of breath. Intubation and orogastric tube placement. EXAM: PORTABLE CHEST 1 VIEW COMPARISON:  None. FINDINGS: Endotracheal tube 7.4 cm from the carina. Enteric tube in place, tip visualized in the stomach. The heart is enlarged. There are bilateral ill-defined perihilar opacities. Minimal Kerley B-lines in the periphery of the right lung. No large pleural effusion or evidence of pneumothorax. Lung apices not included in the field of view. IMPRESSION: 1. Endotracheal tube 7.4 cm from the carina. Enteric tube in place with tip visualized in the stomach. 2. Cardiomegaly. Bilateral ill-defined perihilar opacities, favoring pulmonary edema. Multifocal pneumonia or aspiration could have a similar appearance. Electronically Signed    By: Jeb Levering M.D.   On: 02/17/2018 03:45    Lab Data:  CBC: Recent Labs  Lab 02/23/18 0349 02/24/18 0820 02/25/18 0703 02/25/18 0707 02/26/18 0848 02/27/18 0855  WBC 8.9 11.3* 10.0  --  11.3* 8.8  HGB 12.3* 13.8 12.3* 12.6* 12.4* 10.9*  HCT 37.8* 43.0 37.3* 37.0* 37.6* 33.9*  MCV 99.2 100.5* 97.6  --  96.2 97.7  PLT 207 212 210  --  223 833   Basic Metabolic Panel: Recent Labs  Lab 02/21/18 0329 02/22/18 0336 02/23/18 0349 02/24/18 0348 02/25/18 0703 02/25/18 0707 02/26/18 0848 02/27/18 0546 02/27/18 0855  NA 142 138 135 137  --  136 138  --  140  K 3.5 4.0 3.8 3.8  --  3.7 4.2  --  4.2  CL 103 99* 96* 97*  --   --  101  --  103  CO2 26 23 22 22   --   --  18*  --  19*  GLUCOSE 128* 122* 116* 114*  --  108* 114*  --  116*  BUN 89* 54* 87*  55*  --   --  110*  --  125*  CREATININE 5.05* 4.94* 6.82* 6.59*  --   --  10.89*  --  11.40*  CALCIUM 9.2 9.4 9.4 9.6  --   --  9.1  --  8.7*  MG 2.4 2.3 2.5* 2.4 2.5*  --  2.6* 2.6*  --   PHOS 4.9*  --   --   --   --   --   --   --  11.8*   GFR: Estimated Creatinine Clearance: 6.5 mL/min (A) (by C-G formula based on SCr of 11.4 mg/dL (H)). Liver Function Tests: Recent Labs  Lab 02/21/18 0329 02/27/18 0855  ALBUMIN 3.2* 3.1*   No results for input(s): LIPASE, AMYLASE in the last 168 hours. No results for input(s): AMMONIA in the last 168 hours. Coagulation Profile: Recent Labs  Lab 02/25/18 0703  INR 1.05   Cardiac Enzymes: No results for input(s): CKTOTAL, CKMB, CKMBINDEX, TROPONINI in the last 168 hours. BNP (last 3 results) No results for input(s): PROBNP in the last 8760 hours. HbA1C: No results for input(s): HGBA1C in the last 72 hours. CBG: Recent Labs  Lab 02/26/18 1140 02/26/18 1640 02/26/18 2139 02/27/18 0728 02/27/18 1142  GLUCAP 124* 104* 161* 95 94   Lipid Profile: Recent Labs    02/26/18 0848  TRIG 164*   Thyroid Function Tests: No results for input(s): TSH, T4TOTAL, FREET4,  T3FREE, THYROIDAB in the last 72 hours. Anemia Panel: No results for input(s): VITAMINB12, FOLATE, FERRITIN, TIBC, IRON, RETICCTPCT in the last 72 hours. Urine analysis:    Component Value Date/Time   COLORURINE YELLOW 02/17/2018 0540   APPEARANCEUR CLOUDY (A) 02/17/2018 0540   LABSPEC 1.010 02/17/2018 0540   PHURINE 5.0 02/17/2018 0540   GLUCOSEU NEGATIVE 02/17/2018 0540   HGBUR LARGE (A) 02/17/2018 0540   BILIRUBINUR NEGATIVE 02/17/2018 [place] NEGATIVE 02/17/2018 0540   PROTEINUR 100 (A) 02/17/2018 0540   NITRITE NEGATIVE 02/17/2018 0540   LEUKOCYTESUR NEGATIVE 02/17/2018 0540     Yussuf Sawyers M.D. [facility] 02/27/2018, 3:00 PM  Pager: [phone] Between 7am to 7pm - call Pager - [phone]  After 7pm go to [email or web address] - password [email or web address]  Call night coverage person covering after 7pm

## 2018-02-28 LAB — RENAL FUNCTION PANEL
ALBUMIN: 3 g/dL — AB (ref 3.5–5.0)
Anion gap: 17 — ABNORMAL HIGH (ref 5–15)
BUN: 104 mg/dL — AB (ref 6–20)
CALCIUM: 8.5 mg/dL — AB (ref 8.9–10.3)
CO2: 18 mmol/L — AB (ref 22–32)
Chloride: 104 mmol/L (ref 101–111)
Creatinine, Ser: 10.14 mg/dL — ABNORMAL HIGH (ref 0.61–1.24)
GFR calc Af Amer: 5 mL/min — ABNORMAL LOW (ref >60)
GFR calc non Af Amer: 4 mL/min — ABNORMAL LOW (ref >60)
GLUCOSE: 121 mg/dL — AB (ref 65–99)
PHOSPHORUS: 9.3 mg/dL — AB (ref 2.5–4.6)
Potassium: 4.1 mmol/L (ref 3.5–5.1)
SODIUM: 139 mmol/L (ref 135–145)

## 2018-02-28 LAB — CBC
HEMATOCRIT: 33.5 % — AB (ref 39.0–52.0)
Hemoglobin: 11 g/dL — ABNORMAL LOW (ref 13.0–17.0)
MCH: 32.1 pg (ref 26.0–34.0)
MCHC: 32.8 g/dL (ref 30.0–36.0)
MCV: 97.7 fL (ref 78.0–100.0)
Platelets: 224 10*3/uL (ref 150–400)
RBC: 3.43 MIL/uL — ABNORMAL LOW (ref 4.22–5.81)
RDW: 13.1 % (ref 11.5–15.5)
WBC: 8.9 10*3/uL (ref 4.0–10.5)

## 2018-02-28 LAB — GLUCOSE, CAPILLARY
GLUCOSE-CAPILLARY: 100 mg/dL — AB (ref 65–99)
Glucose-Capillary: 133 mg/dL — ABNORMAL HIGH (ref 65–99)

## 2018-02-28 LAB — MAGNESIUM: Magnesium: 2.5 mg/dL — ABNORMAL HIGH (ref 1.7–2.4)

## 2018-02-28 MED ORDER — HEPARIN SODIUM (PORCINE) 1000 UNIT/ML DIALYSIS: 3000.0000 [IU] | Freq: Once | INTRAMUSCULAR | Status: DC

## 2018-02-28 MED ORDER — SODIUM CHLORIDE 0.9 % IV SOLN: 100.0000 mL | INTRAVENOUS | Status: DC | PRN

## 2018-02-28 MED ORDER — PENTAFLUOROPROP-TETRAFLUOROETH EX AERO: 1.0000 "application " | INHALATION_SPRAY | CUTANEOUS | Status: DC | PRN

## 2018-02-28 MED ORDER — LACTULOSE 10 GM/15ML PO SOLN
30.0000 g | Freq: Once | ORAL | Status: AC
  Administered 2018-02-28: 30 g via ORAL
  Filled 2018-02-28: qty 45

## 2018-02-28 MED ORDER — HEPARIN SODIUM (PORCINE) 1000 UNIT/ML DIALYSIS: 1000.0000 [IU] | INTRAMUSCULAR | Status: DC | PRN

## 2018-02-28 MED ORDER — LIDOCAINE HCL (PF) 1 % IJ SOLN: 5.0000 mL | INTRAMUSCULAR | Status: DC | PRN

## 2018-02-28 MED ORDER — ALTEPLASE 2 MG IJ SOLR: 2.0000 mg | Freq: Once | INTRAMUSCULAR | Status: DC | PRN

## 2018-02-28 MED ORDER — LIDOCAINE-PRILOCAINE 2.5-2.5 % EX CREA: 1.0000 "application " | TOPICAL_CREAM | CUTANEOUS | Status: DC | PRN

## 2018-02-28 NOTE — Progress Notes (Addendum)
[facility] Progress Note   87 year oldBMPMHxof HIV/AIDS,ProstateCa, S/PBrachytherapy,CKD stage Vnot on dialysis,NonischemicCardiomyopathy with EF 19-37%TKW Diastolic dysfunction, andHTN.  In ED patient was noted to be agonal and pulseless in parking lot. CPR started and continued for estimated 5-10 minutes before return of ROSC. Intubated. CXR consistent with pulmonary edema. Creatinine 5.32, LA 4.44. PCCM asked to admit.  He has been started on dialysis and is undergoing the CLIP process     Assessment/ Plan:    ESRD- new ESRD Undergoing CLIP process  - Plan dialysis on MWF schedule -> will skip Fri and then dialyze Sat before returning to the MWF regimen - Seen on HD today at ~1018am; cannot get blood flow above 133ml/min - Cath working better today -> HE WILL NEED HEPARIN BOLUS BEFORE HD  Seen on HD, doing well Filter clotted off and we then bolused him 3000 units of heparin. RIJ cath working better today after cathflo overnight. 3L UF (net 2.5) 2k bath qb/qd 300/600   ANEMIA- Hb 11.9   MBD- difficulty tolerating phoslo Will stop at this point Phos in 4 range we will follow  HTN/VOL- Controlled   ACCESS- LUA AVG declot on 3/26; placed on 3/25  OTHER- EF 20 %   HIV antiretrovirals     Subjective:   Feels better and is ambulating w/ a great appetite. Denies f/c/n.  No complaints   Objective:   BP 98/68   Pulse 94   Temp 98 F (36.7 C) (Oral)   Resp 14   Ht 6\' 2"  (1.88 m)   Wt 85.5 kg (188 lb 7.9 oz) Comment: stood to scale   SpO2 100%   BMI 24.20 kg/m   Intake/Output Summary (Last 24 hours) at 02/28/2018 4097 Last data filed at 02/28/2018 0600 Gross per 24 hour  Intake 360 ml  Output 404 ml  Net -44 ml   Weight change:   Physical Exam: General appearance: alert, no distress Lungs: clear to auscultation bilaterally Heart: regular rate and rhythm and no murmur Abdomen: soft, non-tender; bowel sounds normal;  no masses, no organomegaly Extremities: extremities normal, atraumatic, no cyanosis or edema Pulses: 2+ and symmetric Neurologic: Mental status: Alert, oriented, thought content appropriate Access: RIJ TC, LUA AVG strong bruit, left arm swollen    Imaging: No results found.  Labs: BMET Recent Labs  Lab 02/22/18 0336 02/23/18 0349 02/24/18 0348 02/25/18 0707 02/26/18 0848 02/27/18 0855 02/28/18 0631  NA 138 135 137 136 138 140 139  K 4.0 3.8 3.8 3.7 4.2 4.2 4.1  CL 99* 96* 97*  --  101 103 104  CO2 23 22 22   --  18* 19* 18*  GLUCOSE 122* 116* 114* 108* 114* 116* 121*  BUN 54* 87* 55*  --  110* 125* 104*  CREATININE 4.94* 6.82* 6.59*  --  10.89* 11.40* 10.14*  CALCIUM 9.4 9.4 9.6  --  9.1 8.7* 8.5*  PHOS  --   --   --   --   --  11.8* 9.3*   CBC Recent Labs  Lab 02/25/18 0703 02/25/18 0707 02/26/18 0848 02/27/18 0855 02/28/18 0631  WBC 10.0  --  11.3* 8.8 8.9  HGB 12.3* 12.6* 12.4* 10.9* 11.0*  HCT 37.3* 37.0* 37.6* 33.9* 33.5*  MCV 97.6  --  96.2 97.7 97.7  PLT 210  --  223 217 224    Medications:    . carvedilol  6.25 mg Oral BID WC  . Chlorhexidine Gluconate Cloth  6 each Topical  Daily  . dolutegravir  50 mg Oral Daily  . emtricitabine-tenofovir AF  1 tablet Oral Daily  . feeding supplement (PRO-STAT SUGAR FREE 64)  30 mL Oral BID  . heparin  3,000 Units Dialysis Once in dialysis  . mouth rinse  15 mL Mouth Rinse BID  . scopolamine  1 patch Transdermal Q72H  . sodium chloride flush  10-40 mL Intracatheter Q12H  . sodium chloride flush  3 mL Intravenous Q12H      Otelia Santee, MD 02/28/2018, 9:27 AM

## 2018-02-28 NOTE — Progress Notes (Signed)
Physical Therapy Treatment Patient Details Name: David [name] MRN: [id number] DOB: [birth year] Today's Date: 02/28/2018    History of Present Illness Pt is a 77 y.o. male admitted 02/17/18 with severe SOB and PEA arrest upon arrival requiring CPR before return to [place]; intubated 3/17-3/19. CXR consistent with pulmonary edema. CXR 3/18 shows bilateral pnuemonia. R IJ tunneled HD catheter placed 3/18 to initiate HD. 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     PT Comments    Patient required supervision overall for OOB mobility. Pt reported feeling more fatigued today after HD and with hoarse voice which he reports is new today. Continue to progress as tolerated.    Follow Up Recommendations  No PT follow up;Supervision/Assistance - 24 hour     Equipment Recommendations  None recommended by PT    Recommendations for Other Services       Precautions / Restrictions Precautions Precautions: Fall Precaution Comments: R IJ tunnelled cath    Mobility  Bed Mobility Overal bed mobility: Modified Independent             General bed mobility comments: mod I for increased time, no physical assist required  Transfers Overall transfer level: Needs assistance Equipment used: Rolling walker (2 wheeled) Transfers: Sit to/from Stand Sit to Stand: Supervision         General transfer comment: pt demonstrated safe hand placement   Ambulation/Gait Ambulation/Gait assistance: Supervision Ambulation Distance (Feet): 250 Feet Assistive device: Rolling walker (2 wheeled);Straight cane Gait Pattern/deviations: Step-through pattern;Decreased stride length;Trunk flexed Gait velocity: decreased   General Gait Details: cues for posture and increased bilat step lengths   Stairs            Wheelchair Mobility    Modified Rankin (Stroke Patients Only)       Balance Overall balance assessment: Needs assistance Sitting-balance support: No upper extremity supported;Feet supported Sitting balance-Leahy Scale: Good     Standing balance support: Single extremity supported;During functional activity Standing balance-Leahy Scale: Fair Standing balance comment: able to perform static standing without UE support, requires UE support for ambulation                            Cognition Arousal/Alertness: Awake/alert Behavior During Therapy: WFL for tasks assessed/performed Overall Cognitive Status: Within Functional Limits for tasks assessed                                        Exercises      General Comments General comments (skin integrity, edema, etc.): pt with hoarse voice which he reported is new today and pt also reported he notified his RN      Pertinent Vitals/Pain Pain Assessment: Faces Faces Pain Scale: Hurts little more Pain Location: L UE (limited ROM) Pain Descriptors / Indicators: Sore Pain Intervention(s): Limited activity within patient's tolerance;Monitored during session    Home Living                      Prior Function            PT Goals (current goals can now be found in the care plan section) Acute Rehab PT Goals Patient Stated Goal: Return home PT Goal Formulation: With patient Time For Goal Achievement: 03/06/18 Potential to Achieve Goals:  Good Progress towards PT goals: Progressing toward goals    Frequency    Min 3X/week      PT Plan Current plan remains appropriate    Co-evaluation              AM-PAC PT "6 Clicks" Daily Activity  Outcome Measure  Difficulty turning over in bed (including adjusting bedclothes, sheets and blankets)?: None Difficulty moving from lying on back to sitting on the side of the bed? : None Difficulty sitting down on and standing up from a chair with arms (e.g., wheelchair, bedside commode, etc,.)?: None Help needed  moving to and from a bed to chair (including a wheelchair)?: A Little Help needed walking in hospital room?: A Little Help needed climbing 3-5 steps with a railing? : A Little 6 Click Score: 21    End of Session Equipment Utilized During Treatment: Gait belt Activity Tolerance: Patient tolerated treatment well Patient left: with call bell/phone within reach;in bed Nurse Communication: Mobility status PT Visit Diagnosis: Other abnormalities of gait and mobility (R26.89)     Time: 4287-6811 PT Time Calculation (min) (ACUTE ONLY): 25 min  Charges:  $Gait Training: 8-22 mins $Therapeutic Activity: 8-22 mins                    G Codes:       Earney Navy, PTA Pager: [phone]     Darliss Cheney 02/28/2018, 4:45 PM

## 2018-02-28 NOTE — Progress Notes (Signed)
[facility]                                                                              Patient Demographics  David [name], is a 77 y.o. male, DOB - 05-Apr-1941, MWU:[id number]  Admit date - 02/17/2018   Admitting Physician Kandice Hams, MD  Outpatient Primary MD for the patient is [name], NP  Outpatient specialists:   LOS - 11  days   Medical records reviewed and are as summarized below:    Chief Complaint  Patient presents with  . 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  On the morning of admission, around 2-3 am, patient woke up with severe shortness of breath. Upon arrival to ED patient was noted to be agonal and pulseless in parking lot. CPR started and continued for estimated 5-10 minutes before return of ROSC. Intubated. CXR consistent with pulmonary edema. Creatinine 5.32, LA 4.44.  Patient was admitted by critical care service.  Creatinine 5.32.   Assessment & Plan    Principal Problem:  Acute respiratory failure with hypoxemia (HCC) with acute pulmonary edema -On arrival to ED, patient was noted to be agonal, pulseless, had PEA asystole arrest, had CPR and ACLS, ROSC 5-10 minutes.  Patient was intubated.  -Extubated on 3/19.  Currently respiratory status stable, O2 sats 95% on room air Active Problems: PEA, asystole arrest ROSC in 5-10mins, Acute on chronic systolic and diastolic heart failure ([place]), nonischemic cardiomyopathy -Patient's cardiac arrest with neurological recovery.  Cardiology was consulted, patient also underwent HD. -BNP 800, 2D echo showed EF of 15-20%, slightly down from the prior report of  20-25% -Volume management with HD, continue Coreg -Mildly elevated troponin likely secondary to demand ischemia and chest compressions.  EKG showed left bundle branch block, unclear chronicity. -Patient underwent cardiac cath on 3/20 which showed nonobstructive CAD and recommended continue medical therapy    Chronic obstructive pulmonary disease ([place]) -Chest x-ray showed cardiomegaly and perihilar opacities concerning for pulmonary edema -Currently improving, patient has been extubated, on room air -Continue volume management with HD, flutter valve, pulmonary hygiene    New ESRD (end stage renal disease) ([place]) -Creatinine 5.32 on admission, follows Dr. Posey Pronto in office. -Nephrology, vascular surgery following -HD per nephrology, had fistula placed on 3/25.  Per vascular surgery AV graft appears to be occluded, patient was hypotensive overnight, further management per vascular surgery -Receiving HD today, cath working better, no acute complaints    HIV (human immunodeficiency virus infection) ([place]) -Undetectable HIV RNA, CD4 430 -Continue [place] and Descovy  Anemia of chronic disease -H&H currently stable  Prostate cancer status post brachytherapy  Code Status: Full CODE STATUS DVT Prophylaxis: Heparin subcu Family Communication: Discussed in detail with the patient, all imaging results, lab results explained to the patient    Disposition Plan:  Once stable and CLIP process completed   Time Spent in minutes  15 minutes  Procedures:   Intubated 3/17  Echocardiogram 3/18 EF 15-20%  RIJ tunneled catheter placement 3/18  Extubated 3/19  HD initiated 3/19  Heart catheterization 3/20, no culprit lesion  Consultants:   Nephrology  Cardiology  Vascular surgery  Antimicrobials:      Medications  Scheduled Meds: . carvedilol  6.25 mg Oral BID WC  . Chlorhexidine Gluconate Cloth  6 each Topical Daily  . dolutegravir  50 mg Oral Daily  . emtricitabine-tenofovir AF   1 tablet Oral Daily  . feeding supplement (PRO-STAT SUGAR FREE 64)  30 mL Oral BID  . mouth rinse  15 mL Mouth Rinse BID  . scopolamine  1 patch Transdermal Q72H  . sodium chloride flush  10-40 mL Intracatheter Q12H  . sodium chloride flush  3 mL Intravenous Q12H   Continuous Infusions: . sodium chloride    . sodium chloride     PRN Meds:.sodium chloride, acetaminophen, ondansetron (ZOFRAN) IV, oxyCODONE-acetaminophen, polyethylene glycol, sodium chloride flush, sodium chloride flush   Antibiotics   Anti-infectives (From admission, onward)   Start     Dose/Rate Route Frequency Ordered Stop   02/25/18 0600  ceFAZolin (ANCEF) IVPB 2g/100 mL premix     2 g 200 mL/hr over 30 Minutes Intravenous On call to O.R. 02/22/18 1100 02/25/18 0750   02/18/18 1330  ceFAZolin (ANCEF) IVPB 2g/100 mL premix     2 g 200 mL/hr over 30 Minutes Intravenous  Once 02/18/18 1212 02/18/18 1409   02/17/18 1200  dolutegravir (TIVICAY) tablet 50 mg     50 mg Oral Daily 02/17/18 0813     02/17/18 1200  emtricitabine-tenofovir AF (DESCOVY) 200-25 MG per tablet 1 tablet     1 tablet Oral Daily 02/17/18 0813          Subjective:   David [name] was seen and examined today.  Seen during HD, no complaints, had brief hypotension.,  Catheter working properly today.  Patient denies dizziness, shortness of breath, abdominal pain, N/V/D/C, new weakness, numbess, tingling.  BP soft overnight  Objective:   Vitals:   02/28/18 1006 02/28/18 1030 02/28/18 1103 02/28/18 1130  BP: 114/77 101/71 (!) 92/57 (!) 100/54  Pulse: 69 62 97 89  Resp:   18 18  Temp:   (!) 97.5 F (36.4 C) 98 F (36.7 C)  TempSrc:   Axillary Oral  SpO2:   100% 100%  Weight:   84 kg (185 lb 3 oz)   Height:        Intake/Output Summary (Last 24 hours) at 02/28/2018 1451 Last data filed at 02/28/2018 1100 Gross per 24 hour  Intake 120 ml  Output 1898 ml  Net -1778 ml     Wt Readings from Last 3 Encounters:  02/28/18 84 kg (185 lb 3  oz)     Exam   General: Alert and oriented x 3, NAD  Eyes:   HEENT:  Atraumatic, normocephalic,  Cardiovascular: S1 S2 auscultated, RRR No pedal edema b/l  Respiratory: Clear to auscultation bilaterally, no wheezing, rales or rhonchi  Gastrointestinal: Soft, nontender, nondistended, + bowel sounds  Ext: no pedal edema bilaterally  Neuro: no new deficits  Musculoskeletal: No digital cyanosis, clubbing  Skin: No rashes  Psych: Normal affect and demeanor, alert and oriented x3     Data Reviewed:  I have personally reviewed following labs and imaging studies  Micro Results No results found for this  or any previous visit (from the past 240 hour(s)).  Radiology Reports Ct Head Wo Contrast  Result Date: 02/17/2018 CLINICAL DATA:  Altered level of consciousness.  Found unresponsive. EXAM: CT HEAD WITHOUT CONTRAST TECHNIQUE: Contiguous axial images were obtained from the base of the skull through the vertex without intravenous contrast. COMPARISON:  Head CT 07/31/2017, brain MRI 11/01/2017 FINDINGS: Brain: Generalized atrophy and chronic small vessel ischemia, similar to prior exams. No intracranial hemorrhage, mass effect, or midline shift. No hydrocephalus. The basilar cisterns are patent. No evidence of territorial infarct or acute ischemia. No evidence of cerebral edema. No extra-axial or intracranial fluid collection. Vascular: No hyperdense vessel or unexpected calcification. Skull: No fracture or focal lesion. Sinuses/Orbits: Metallic density in the right globe anterior chamber is unchanged from prior exam. Bilateral cataract resection. Minimal metallic debris in the anterior scalp. Mild mucosal thickening of the ethmoid air cells and sphenoid sinus. Mastoid air cells are clear. Other: None. IMPRESSION: 1.  No acute intracranial abnormality. 2. Generalized atrophy and chronic small vessel ischemia. 3. Metallic density in the right globe, chronic and unchanged from prior exam.  Electronically Signed   By: Jeb Levering M.D.   On: 02/17/2018 05:51   Ct Chest Wo Contrast  Result Date: 02/17/2018 CLINICAL DATA:  Found unresponsive. Post CPR. Acute resp illness, > 47 years old EXAM: CT CHEST WITHOUT CONTRAST TECHNIQUE: Multidetector CT imaging of the chest was performed following the standard protocol without IV contrast. COMPARISON:  Chest radiograph earlier this day. Additional radiographs and CT in a different patient jacket, most recent CT 11/25/2017 FINDINGS: Cardiovascular: Aortic atherosclerosis. Coronary artery calcifications. Mild multi chamber cardiomegaly. Minimal pericardial fluid. Mediastinum/Nodes: Scattered mediastinal nodes not enlarged by size criteria. Lack of IV contrast limits assessment for hilar adenopathy. Endotracheal and enteric tubes appropriately positioned. Lungs/Pleura: Multifocal confluent, ground-glass, patchy and nodular opacities throughout both lungs. Consolidations in the dependent lungs appear confluent, and there is debris within the distal trachea and right mainstem bronchi, suggesting aspiration. No definite septal thickening. No pleural fluid. Upper Abdomen: Tip and side port of the enteric tube in the stomach. Bilateral renal atrophy with hyperdense lesions in both kidneys, likely cysts but incompletely characterized. Musculoskeletal: No acute finding. There are no acute or suspicious osseous abnormalities. IMPRESSION: 1. Multifocal bilateral consolidative, ground-glass, nodular airspace opacities throughout both lungs. Minimal debris in the distal trachea and right mainstem bronchus. Overall findings suggest pneumonia with probable aspiration. Degree of pulmonary edema is also considered. Ground-glass opacities in a patient with HIV can be seen with pneumocystis pneumonia , however consolidative opacities are the prominent pattern. 2. Cardiomegaly with aortic atherosclerosis. Aortic Atherosclerosis (ICD10-I70.0). Electronically Signed   By:  Jeb Levering M.D.   On: 02/17/2018 06:02   Ir Fluoro Guide Cv Line Right  Result Date: 02/18/2018 CLINICAL DATA:  End-stage renal disease, needs durable venous access for hemodialysis EXAM: TUNNELED HEMODIALYSIS CATHETER PLACEMENT WITH ULTRASOUND AND FLUOROSCOPIC GUIDANCE TECHNIQUE: The procedure, risks, benefits, and alternatives were explained to the spouse. Questions regarding the procedure were encouraged and answered. The spouse understands and consents to the procedure. As antibiotic prophylaxis, cefazolin 2 g was ordered pre-procedure and administered intravenously within one hour of incision.Patency of the right IJ vein was confirmed with ultrasound with image documentation. An appropriate skin site was determined. Region was prepped using maximum barrier technique including cap and mask, sterile gown, sterile gloves, large sterile sheet, and Chlorhexidine as cutaneous antisepsis. The region was infiltrated locally with 1% lidocaine. Intravenous Fentanyl and Versed were  administered as conscious sedation during continuous monitoring of the patient's level of consciousness and physiological / cardiorespiratory status by the radiology RN, with a total moderate sedation time of 10 minutes. Under real-time ultrasound guidance, the right IJ vein was accessed with a 21 gauge micropuncture needle; the needle tip within the vein was confirmed with ultrasound image documentation. Needle exchanged over the 018 guidewire for transitional dilator, which allowed advancement of a Benson wire into the IVC. Over this, an MPA catheter was advanced. A Palindrome 23 hemodialysis catheter was tunneled from the right anterior chest wall approach to the right IJ dermatotomy site. The MPA catheter was exchanged over an Amplatz wire for serial vascular dilators which allow placement of a peel-away sheath, through which the catheter was advanced under intermittent fluoroscopy, positioned with its tips in the proximal and  midright atrium. Spot chest radiograph confirms good catheter position. No pneumothorax. Catheter was flushed and primed per protocol. Catheter secured externally with O Prolene sutures. The right IJ dermatotomy site was closed with Dermabond. COMPLICATIONS: COMPLICATIONS None immediate FLUOROSCOPY TIME:  1 minutes; 9 mGy COMPARISON:  None IMPRESSION: 1. Technically successful placement of tunneled right IJ hemodialysis catheter with ultrasound and fluoroscopic guidance. Ready for routine use. ACCESS: Remains approachable for percutaneous intervention as needed. Electronically Signed   By: Lucrezia Europe M.D.   On: 02/18/2018 16:39   Ir US Guide Vasc Access Right  Result Date: 02/18/2018 CLINICAL DATA:  End-stage renal disease, needs durable venous access for hemodialysis EXAM: TUNNELED HEMODIALYSIS CATHETER PLACEMENT WITH ULTRASOUND AND FLUOROSCOPIC GUIDANCE TECHNIQUE: The procedure, risks, benefits, and alternatives were explained to the spouse. Questions regarding the procedure were encouraged and answered. The spouse understands and consents to the procedure. As antibiotic prophylaxis, cefazolin 2 g was ordered pre-procedure and administered intravenously within one hour of incision.Patency of the right IJ vein was confirmed with ultrasound with image documentation. An appropriate skin site was determined. Region was prepped using maximum barrier technique including cap and mask, sterile gown, sterile gloves, large sterile sheet, and Chlorhexidine as cutaneous antisepsis. The region was infiltrated locally with 1% lidocaine. Intravenous Fentanyl and Versed were administered as conscious sedation during continuous monitoring of the patient's level of consciousness and physiological / cardiorespiratory status by the radiology RN, with a total moderate sedation time of 10 minutes. Under real-time ultrasound guidance, the right IJ vein was accessed with a 21 gauge micropuncture needle; the needle tip within the vein  was confirmed with ultrasound image documentation. Needle exchanged over the 018 guidewire for transitional dilator, which allowed advancement of a Benson wire into the IVC. Over this, an MPA catheter was advanced. A Palindrome 23 hemodialysis catheter was tunneled from the right anterior chest wall approach to the right IJ dermatotomy site. The MPA catheter was exchanged over an Amplatz wire for serial vascular dilators which allow placement of a peel-away sheath, through which the catheter was advanced under intermittent fluoroscopy, positioned with its tips in the proximal and midright atrium. Spot chest radiograph confirms good catheter position. No pneumothorax. Catheter was flushed and primed per protocol. Catheter secured externally with O Prolene sutures. The right IJ dermatotomy site was closed with Dermabond. COMPLICATIONS: COMPLICATIONS None immediate FLUOROSCOPY TIME:  1 minutes; 9 mGy COMPARISON:  None IMPRESSION: 1. Technically successful placement of tunneled right IJ hemodialysis catheter with ultrasound and fluoroscopic guidance. Ready for routine use. ACCESS: Remains approachable for percutaneous intervention as needed. Electronically Signed   By: Lucrezia Europe M.D.   On: 02/18/2018 16:39  Dg Chest Port 1 View  Result Date: 02/19/2018 CLINICAL DATA:  Acute on chronic CHF, COPD, respiratory arrest and cardiac arrest. End-stage renal disease, HIV. EXAM: PORTABLE CHEST 1 VIEW COMPARISON:  Chest x-ray of February 18, 2018 FINDINGS: There has been interval placement of a dual-lumen dialysis catheter whose tip projects at the right cavoatrial junction. The lungs are well-expanded. The interstitial markings are slightly less conspicuous overall today. There is no significant pleural effusion. The cardiac silhouette is enlarged. The pulmonary vascularity is not engorged. The endotracheal tube tip lies 5.9 cm above the carina. The esophagogastric tube tip in proximal port project below the GE junction.  IMPRESSION: Interval placement of a dialysis catheter via the right internal jugular approach without postprocedure complication. Stable cardiomegaly with mild improvement in interstitial edema. The support tubes are in reasonable position. Electronically Signed   By: David  Martinique M.D.   On: 02/19/2018 07:31   Dg Chest Port 1 View  Result Date: 02/18/2018 CLINICAL DATA:  Acute respiratory failure, hypoxia, intubated patient. History of CHF, renal insufficiency, HIV, and prostate malignancy. EXAM: PORTABLE CHEST 1 VIEW COMPARISON:  Chest x-ray and chest CT scan of February 17, 2018 FINDINGS: The lungs are adequately inflated. The interstitial markings remain increased and are more conspicuous today on the left. The cardiac silhouette remains enlarged. The pulmonary vascularity is not engorged. The endotracheal tube tip lies 6.4 cm above the carina. The esophagogastric tube tip and proximal port project below the GE junction. IMPRESSION: Persistent bilateral pneumonia. Slight increased opacity noted on the left today. No overt pulmonary edema. The support tubes are in reasonable position where visualized. Electronically Signed   By: David  Martinique M.D.   On: 02/18/2018 08:35   Dg Chest Portable 1 View  Result Date: 02/17/2018 CLINICAL DATA:  Shortness of breath. Intubation and orogastric tube placement. EXAM: PORTABLE CHEST 1 VIEW COMPARISON:  None. FINDINGS: Endotracheal tube 7.4 cm from the carina. Enteric tube in place, tip visualized in the stomach. The heart is enlarged. There are bilateral ill-defined perihilar opacities. Minimal Kerley B-lines in the periphery of the right lung. No large pleural effusion or evidence of pneumothorax. Lung apices not included in the field of view. IMPRESSION: 1. Endotracheal tube 7.4 cm from the carina. Enteric tube in place with tip visualized in the stomach. 2. Cardiomegaly. Bilateral ill-defined perihilar opacities, favoring pulmonary edema. Multifocal pneumonia or  aspiration could have a similar appearance. Electronically Signed   By: Jeb Levering M.D.   On: 02/17/2018 03:45    Lab Data:  CBC: Recent Labs  Lab 02/24/18 0820 02/25/18 0703 02/25/18 3748 02/26/18 0848 02/27/18 0855 02/28/18 0631  WBC 11.3* 10.0  --  11.3* 8.8 8.9  HGB 13.8 12.3* 12.6* 12.4* 10.9* 11.0*  HCT 43.0 37.3* 37.0* 37.6* 33.9* 33.5*  MCV 100.5* 97.6  --  96.2 97.7 97.7  PLT 212 210  --  223 217 270   Basic Metabolic Panel: Recent Labs  Lab 02/23/18 0349 02/24/18 0348 02/25/18 0703 02/25/18 0707 02/26/18 0848 02/27/18 0546 02/27/18 0855 02/28/18 0516 02/28/18 0631  NA 135 137  --  136 138  --  140  --  139  K 3.8 3.8  --  3.7 4.2  --  4.2  --  4.1  CL 96* 97*  --   --  101  --  103  --  104  CO2 22 22  --   --  18*  --  19*  --  18*  GLUCOSE 116*  114*  --  108* 114*  --  116*  --  121*  BUN 87* 55*  --   --  110*  --  125*  --  104*  CREATININE 6.82* 6.59*  --   --  10.89*  --  11.40*  --  10.14*  CALCIUM 9.4 9.6  --   --  9.1  --  8.7*  --  8.5*  MG 2.5* 2.4 2.5*  --  2.6* 2.6*  --  2.5*  --   PHOS  --   --   --   --   --   --  11.8*  --  9.3*   GFR: Estimated Creatinine Clearance: 7.3 mL/min (A) (by C-G formula based on SCr of 10.14 mg/dL (H)). Liver Function Tests: Recent Labs  Lab 02/27/18 0855 02/28/18 0631  ALBUMIN 3.1* 3.0*   No results for input(s): LIPASE, AMYLASE in the last 168 hours. No results for input(s): AMMONIA in the last 168 hours. Coagulation Profile: Recent Labs  Lab 02/25/18 0703  INR 1.05   Cardiac Enzymes: No results for input(s): CKTOTAL, CKMB, CKMBINDEX, TROPONINI in the last 168 hours. BNP (last 3 results) No results for input(s): PROBNP in the last 8760 hours. HbA1C: No results for input(s): HGBA1C in the last 72 hours. CBG: Recent Labs  Lab 02/27/18 0728 02/27/18 1142 02/27/18 1636 02/27/18 2113 02/28/18 1128  GLUCAP 95 94 110* 105* 100*   Lipid Profile: Recent Labs    02/26/18 0848  TRIG 164*    Thyroid Function Tests: No results for input(s): TSH, T4TOTAL, FREET4, T3FREE, THYROIDAB in the last 72 hours. Anemia Panel: No results for input(s): VITAMINB12, FOLATE, FERRITIN, TIBC, IRON, RETICCTPCT in the last 72 hours. Urine analysis:    Component Value Date/Time   COLORURINE YELLOW 02/17/2018 0540   APPEARANCEUR CLOUDY (A) 02/17/2018 0540   LABSPEC 1.010 02/17/2018 0540   PHURINE 5.0 02/17/2018 0540   GLUCOSEU NEGATIVE 02/17/2018 0540   HGBUR LARGE (A) 02/17/2018 0540   BILIRUBINUR NEGATIVE 02/17/2018 [place] NEGATIVE 02/17/2018 0540   PROTEINUR 100 (A) 02/17/2018 0540   NITRITE NEGATIVE 02/17/2018 0540   LEUKOCYTESUR NEGATIVE 02/17/2018 0540     Ripudeep Rai M.D. [facility] 02/28/2018, 2:51 PM  Pager: [phone] Between 7am to 7pm - call Pager - [phone]  After 7pm go to [email or web address] - password [email or web address]  Call night coverage person covering after 7pm

## 2018-03-01 LAB — MAGNESIUM: MAGNESIUM: 2.5 mg/dL — AB (ref 1.7–2.4)

## 2018-03-01 LAB — GLUCOSE, CAPILLARY
GLUCOSE-CAPILLARY: 140 mg/dL — AB (ref 65–99)
GLUCOSE-CAPILLARY: 82 mg/dL (ref 65–99)
Glucose-Capillary: 139 mg/dL — ABNORMAL HIGH (ref 65–99)
Glucose-Capillary: 94 mg/dL (ref 65–99)

## 2018-03-01 LAB — TRIGLYCERIDES: Triglycerides: 152 mg/dL — ABNORMAL HIGH (ref <150)

## 2018-03-01 MED ORDER — SORBITOL 70 % SOLN
960.0000 mL | TOPICAL_OIL | Freq: Once | ORAL | Status: AC
  Administered 2018-03-01: 960 mL via RECTAL
  Filled 2018-03-01: qty 473

## 2018-03-01 MED ORDER — MIDODRINE HCL 5 MG PO TABS
5.0000 mg | ORAL_TABLET | Freq: Two times a day (BID) | ORAL | Status: DC
  Administered 2018-03-01 – 2018-03-08 (×13): 5 mg via ORAL
  Filled 2018-03-01 (×13): qty 1

## 2018-03-01 MED ORDER — SENNOSIDES-DOCUSATE SODIUM 8.6-50 MG PO TABS
1.0000 | ORAL_TABLET | Freq: Two times a day (BID) | ORAL | Status: DC
  Administered 2018-03-01 – 2018-03-08 (×14): 1 via ORAL
  Filled 2018-03-01 (×16): qty 1

## 2018-03-01 NOTE — Progress Notes (Signed)
[facility]                                                                              Patient Demographics  David [name], is a 77 y.o. male, DOB - [birth year], YJE:[id number]  Admit date - 02/17/2018   Admitting Physician Kandice Hams, MD  Outpatient Primary MD for the patient is [name], NP  Outpatient specialists:   LOS - 12  days   Medical records reviewed and are as summarized below:    Chief Complaint  Patient presents with  . 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  On the morning of admission, around 2-3 am, patient woke up with severe shortness of breath. Upon arrival to ED patient was noted to be agonal and pulseless in parking lot. CPR started and continued for estimated 5-10 minutes before return of ROSC. Intubated. CXR consistent with pulmonary edema. Creatinine 5.32, LA 4.44.  Patient was admitted by critical care service.  Creatinine 5.32.   Assessment & Plan    Principal Problem:  Acute respiratory failure with hypoxemia (HCC) with acute pulmonary edema -On arrival to ED, patient was noted to be agonal, pulseless, had PEA asystole arrest, had CPR and ACLS, ROSC 5-10 minutes.  Patient was intubated.  -Extubated on 3/19.  Currently stable, O2 sats 98% on room air  Active Problems: PEA, asystole arrest ROSC in 5-10mins, Acute on chronic systolic and diastolic heart failure ([place]), nonischemic cardiomyopathy -Patient's cardiac arrest with neurological recovery.  Cardiology was consulted, patient also underwent HD. -BNP 800, 2D echo showed EF of 15-20%, slightly down from the prior report of 20-25% -Volume  management with HD, continue Coreg -Mildly elevated troponin likely secondary to demand ischemia and chest compressions.  EKG showed left bundle branch block, unclear chronicity. -Patient underwent cardiac cath on 3/20 which showed nonobstructive CAD and recommended continue medical therapy    Chronic obstructive pulmonary disease ([place]) -Chest x-ray showed cardiomegaly and perihilar opacities concerning for pulmonary edema -Currently improving, patient has been extubated, on room air -Continue volume management with HD, flutter valve, pulmonary hygiene    New ESRD (end stage renal disease) ([place]) -Creatinine 5.32 on admission, follows Dr. Posey Pronto in office. -Nephrology, vascular surgery following -HD per nephrology, had fistula placed on 3/25.   -Per nephrology, plan dialysis, MWF schedule    HIV (human immunodeficiency virus infection) ([place]) -Undetectable HIV RNA, CD4 430 -Continue Tivicay and Descovy  Anemia of chronic disease -H&H stable  Prostate cancer status post brachytherapy  constipation -Patient reports last BM 4 days ago, continue Senokot-S, MiraLAX, no effect with lactulose, will give smog enema  Code Status: Full CODE STATUS DVT Prophylaxis: Heparin subcu Family Communication: Discussed in detail with the patient, all imaging results, lab results explained to the patient    Disposition Plan: Once stable  and CLIP process completed   Time Spent in minutes  15 minutes  Procedures:   Intubated 3/17  Echocardiogram 3/18 EF 15-20%  RIJ tunneled catheter placement 3/18  Extubated 3/19  HD initiated 3/19  Heart catheterization 3/20, no culprit lesion  Consultants:   Nephrology  Cardiology  Vascular surgery  Antimicrobials:      Medications  Scheduled Meds: . carvedilol  6.25 mg Oral BID WC  . Chlorhexidine Gluconate Cloth  6 each Topical Daily  . dolutegravir  50 mg Oral Daily  . emtricitabine-tenofovir AF  1 tablet Oral Daily  . feeding  supplement (PRO-STAT SUGAR FREE 64)  30 mL Oral BID  . mouth rinse  15 mL Mouth Rinse BID  . midodrine  5 mg Oral BID WC  . scopolamine  1 patch Transdermal Q72H  . senna-docusate  1 tablet Oral BID  . sodium chloride flush  10-40 mL Intracatheter Q12H  . sodium chloride flush  3 mL Intravenous Q12H   Continuous Infusions: . sodium chloride    . sodium chloride     PRN Meds:.sodium chloride, acetaminophen, ondansetron (ZOFRAN) IV, oxyCODONE-acetaminophen, polyethylene glycol, sodium chloride flush, sodium chloride flush   Antibiotics   Anti-infectives (From admission, onward)   Start     Dose/Rate Route Frequency Ordered Stop   02/25/18 0600  ceFAZolin (ANCEF) IVPB 2g/100 mL premix     2 g 200 mL/hr over 30 Minutes Intravenous On call to O.R. 02/22/18 1100 02/25/18 0750   02/18/18 1330  ceFAZolin (ANCEF) IVPB 2g/100 mL premix     2 g 200 mL/hr over 30 Minutes Intravenous  Once 02/18/18 1212 02/18/18 1409   02/17/18 1200  dolutegravir (TIVICAY) tablet 50 mg     50 mg Oral Daily 02/17/18 0813     02/17/18 1200  emtricitabine-tenofovir AF (DESCOVY) 200-25 MG per tablet 1 tablet     1 tablet Oral Daily 02/17/18 0813          Subjective:   [name] was seen and examined today.  Denies any specific complaints, complaining of constipation.  BP more stable today. Patient denies dizziness, shortness of breath, abdominal pain.  Feeling nauseous and constipated.  Objective:   Vitals:   02/28/18 2021 03/01/18 0453 03/01/18 0833 03/01/18 1000  BP: 97/62 91/63 98/63  99/63  Pulse: (!) 103 (!) 55 91 93  Resp: 16 15  16   Temp: 97.9 F (36.6 C) 98.5 F (36.9 C)  98.2 F (36.8 C)  TempSrc:    Oral  SpO2: 98% 97%  98%  Weight:      Height:        Intake/Output Summary (Last 24 hours) at 03/01/2018 1506 Last data filed at 03/01/2018 1300 Gross per 24 hour  Intake 486 ml  Output 225 ml  Net 261 ml     Wt Readings from Last 3 Encounters:  02/28/18 84 kg (185 lb 3 oz)      Exam   general: Alert and oriented x 3, NAD  Eyes:   HEENT:    Cardiovascular: S1 S2  Regular rate and rhythm. No pedal edema b/l  Respiratory: CTA B  Gastrointestinal: Soft, nontender, nondistended, + bowel sounds  Ext: no pedal edema bilaterally  Neuro: no new deficit  Musculoskeletal: No digital cyanosis, clubbing  Skin: No rashes  Psych: Normal affect and demeanor, alert and oriented x3     Data Reviewed:  I have personally reviewed following labs and imaging studies  Micro Results No results found for  this or any previous visit (from the past 240 hour(s)).  Radiology Reports Ct Head Wo Contrast  Result Date: 02/17/2018 CLINICAL DATA:  Altered level of consciousness.  Found unresponsive. EXAM: CT HEAD WITHOUT CONTRAST TECHNIQUE: Contiguous axial images were obtained from the base of the skull through the vertex without intravenous contrast. COMPARISON:  Head CT 07/31/2017, brain MRI 11/01/2017 FINDINGS: Brain: Generalized atrophy and chronic small vessel ischemia, similar to prior exams. No intracranial hemorrhage, mass effect, or midline shift. No hydrocephalus. The basilar cisterns are patent. No evidence of territorial infarct or acute ischemia. No evidence of cerebral edema. No extra-axial or intracranial fluid collection. Vascular: No hyperdense vessel or unexpected calcification. Skull: No fracture or focal lesion. Sinuses/Orbits: Metallic density in the right globe anterior chamber is unchanged from prior exam. Bilateral cataract resection. Minimal metallic debris in the anterior scalp. Mild mucosal thickening of the ethmoid air cells and sphenoid sinus. Mastoid air cells are clear. Other: None. IMPRESSION: 1.  No acute intracranial abnormality. 2. Generalized atrophy and chronic small vessel ischemia. 3. Metallic density in the right globe, chronic and unchanged from prior exam. Electronically Signed   By: Jeb Levering M.D.   On: 02/17/2018 05:51   Ct  Chest Wo Contrast  Result Date: 02/17/2018 CLINICAL DATA:  Found unresponsive. Post CPR. Acute resp illness, > 62 years old EXAM: CT CHEST WITHOUT CONTRAST TECHNIQUE: Multidetector CT imaging of the chest was performed following the standard protocol without IV contrast. COMPARISON:  Chest radiograph earlier this day. Additional radiographs and CT in a different patient jacket, most recent CT 11/25/2017 FINDINGS: Cardiovascular: Aortic atherosclerosis. Coronary artery calcifications. Mild multi chamber cardiomegaly. Minimal pericardial fluid. Mediastinum/Nodes: Scattered mediastinal nodes not enlarged by size criteria. Lack of IV contrast limits assessment for hilar adenopathy. Endotracheal and enteric tubes appropriately positioned. Lungs/Pleura: Multifocal confluent, ground-glass, patchy and nodular opacities throughout both lungs. Consolidations in the dependent lungs appear confluent, and there is debris within the distal trachea and right mainstem bronchi, suggesting aspiration. No definite septal thickening. No pleural fluid. Upper Abdomen: Tip and side port of the enteric tube in the stomach. Bilateral renal atrophy with hyperdense lesions in both kidneys, likely cysts but incompletely characterized. Musculoskeletal: No acute finding. There are no acute or suspicious osseous abnormalities. IMPRESSION: 1. Multifocal bilateral consolidative, ground-glass, nodular airspace opacities throughout both lungs. Minimal debris in the distal trachea and right mainstem bronchus. Overall findings suggest pneumonia with probable aspiration. Degree of pulmonary edema is also considered. Ground-glass opacities in a patient with HIV can be seen with pneumocystis pneumonia , however consolidative opacities are the prominent pattern. 2. Cardiomegaly with aortic atherosclerosis. Aortic Atherosclerosis (ICD10-I70.0). Electronically Signed   By: Jeb Levering M.D.   On: 02/17/2018 06:02   Ir Fluoro Guide Cv Line  Right  Result Date: 02/18/2018 CLINICAL DATA:  End-stage renal disease, needs durable venous access for hemodialysis EXAM: TUNNELED HEMODIALYSIS CATHETER PLACEMENT WITH ULTRASOUND AND FLUOROSCOPIC GUIDANCE TECHNIQUE: The procedure, risks, benefits, and alternatives were explained to the spouse. Questions regarding the procedure were encouraged and answered. The spouse understands and consents to the procedure. As antibiotic prophylaxis, cefazolin 2 g was ordered pre-procedure and administered intravenously within one hour of incision.Patency of the right IJ vein was confirmed with ultrasound with image documentation. An appropriate skin site was determined. Region was prepped using maximum barrier technique including cap and mask, sterile gown, sterile gloves, large sterile sheet, and Chlorhexidine as cutaneous antisepsis. The region was infiltrated locally with 1% lidocaine. Intravenous Fentanyl and Versed  were administered as conscious sedation during continuous monitoring of the patient's level of consciousness and physiological / cardiorespiratory status by the radiology RN, with a total moderate sedation time of 10 minutes. Under real-time ultrasound guidance, the right IJ vein was accessed with a 21 gauge micropuncture needle; the needle tip within the vein was confirmed with ultrasound image documentation. Needle exchanged over the 018 guidewire for transitional dilator, which allowed advancement of a Benson wire into the IVC. Over this, an MPA catheter was advanced. A Palindrome 23 hemodialysis catheter was tunneled from the right anterior chest wall approach to the right IJ dermatotomy site. The MPA catheter was exchanged over an Amplatz wire for serial vascular dilators which allow placement of a peel-away sheath, through which the catheter was advanced under intermittent fluoroscopy, positioned with its tips in the proximal and midright atrium. Spot chest radiograph confirms good catheter position. No  pneumothorax. Catheter was flushed and primed per protocol. Catheter secured externally with O Prolene sutures. The right IJ dermatotomy site was closed with Dermabond. COMPLICATIONS: COMPLICATIONS None immediate FLUOROSCOPY TIME:  1 minutes; 9 mGy COMPARISON:  None IMPRESSION: 1. Technically successful placement of tunneled right IJ hemodialysis catheter with ultrasound and fluoroscopic guidance. Ready for routine use. ACCESS: Remains approachable for percutaneous intervention as needed. Electronically Signed   By: Lucrezia Europe M.D.   On: 02/18/2018 16:39   Ir US Guide Vasc Access Right  Result Date: 02/18/2018 CLINICAL DATA:  End-stage renal disease, needs durable venous access for hemodialysis EXAM: TUNNELED HEMODIALYSIS CATHETER PLACEMENT WITH ULTRASOUND AND FLUOROSCOPIC GUIDANCE TECHNIQUE: The procedure, risks, benefits, and alternatives were explained to the spouse. Questions regarding the procedure were encouraged and answered. The spouse understands and consents to the procedure. As antibiotic prophylaxis, cefazolin 2 g was ordered pre-procedure and administered intravenously within one hour of incision.Patency of the right IJ vein was confirmed with ultrasound with image documentation. An appropriate skin site was determined. Region was prepped using maximum barrier technique including cap and mask, sterile gown, sterile gloves, large sterile sheet, and Chlorhexidine as cutaneous antisepsis. The region was infiltrated locally with 1% lidocaine. Intravenous Fentanyl and Versed were administered as conscious sedation during continuous monitoring of the patient's level of consciousness and physiological / cardiorespiratory status by the radiology RN, with a total moderate sedation time of 10 minutes. Under real-time ultrasound guidance, the right IJ vein was accessed with a 21 gauge micropuncture needle; the needle tip within the vein was confirmed with ultrasound image documentation. Needle exchanged over  the 018 guidewire for transitional dilator, which allowed advancement of a Benson wire into the IVC. Over this, an MPA catheter was advanced. A Palindrome 23 hemodialysis catheter was tunneled from the right anterior chest wall approach to the right IJ dermatotomy site. The MPA catheter was exchanged over an Amplatz wire for serial vascular dilators which allow placement of a peel-away sheath, through which the catheter was advanced under intermittent fluoroscopy, positioned with its tips in the proximal and midright atrium. Spot chest radiograph confirms good catheter position. No pneumothorax. Catheter was flushed and primed per protocol. Catheter secured externally with O Prolene sutures. The right IJ dermatotomy site was closed with Dermabond. COMPLICATIONS: COMPLICATIONS None immediate FLUOROSCOPY TIME:  1 minutes; 9 mGy COMPARISON:  None IMPRESSION: 1. Technically successful placement of tunneled right IJ hemodialysis catheter with ultrasound and fluoroscopic guidance. Ready for routine use. ACCESS: Remains approachable for percutaneous intervention as needed. Electronically Signed   By: Lucrezia Europe M.D.   On: 02/18/2018 16:39  Dg Chest Port 1 View  Result Date: 02/19/2018 CLINICAL DATA:  Acute on chronic CHF, COPD, respiratory arrest and cardiac arrest. End-stage renal disease, HIV. EXAM: PORTABLE CHEST 1 VIEW COMPARISON:  Chest x-ray of February 18, 2018 FINDINGS: There has been interval placement of a dual-lumen dialysis catheter whose tip projects at the right cavoatrial junction. The lungs are well-expanded. The interstitial markings are slightly less conspicuous overall today. There is no significant pleural effusion. The cardiac silhouette is enlarged. The pulmonary vascularity is not engorged. The endotracheal tube tip lies 5.9 cm above the carina. The esophagogastric tube tip in proximal port project below the GE junction. IMPRESSION: Interval placement of a dialysis catheter via the right internal  jugular approach without postprocedure complication. Stable cardiomegaly with mild improvement in interstitial edema. The support tubes are in reasonable position. Electronically Signed   By: David  Martinique M.D.   On: 02/19/2018 07:31   Dg Chest Port 1 View  Result Date: 02/18/2018 CLINICAL DATA:  Acute respiratory failure, hypoxia, intubated patient. History of CHF, renal insufficiency, HIV, and prostate malignancy. EXAM: PORTABLE CHEST 1 VIEW COMPARISON:  Chest x-ray and chest CT scan of February 17, 2018 FINDINGS: The lungs are adequately inflated. The interstitial markings remain increased and are more conspicuous today on the left. The cardiac silhouette remains enlarged. The pulmonary vascularity is not engorged. The endotracheal tube tip lies 6.4 cm above the carina. The esophagogastric tube tip and proximal port project below the GE junction. IMPRESSION: Persistent bilateral pneumonia. Slight increased opacity noted on the left today. No overt pulmonary edema. The support tubes are in reasonable position where visualized. Electronically Signed   By: David  Martinique M.D.   On: 02/18/2018 08:35   Dg Chest Portable 1 View  Result Date: 02/17/2018 CLINICAL DATA:  Shortness of breath. Intubation and orogastric tube placement. EXAM: PORTABLE CHEST 1 VIEW COMPARISON:  None. FINDINGS: Endotracheal tube 7.4 cm from the carina. Enteric tube in place, tip visualized in the stomach. The heart is enlarged. There are bilateral ill-defined perihilar opacities. Minimal Kerley B-lines in the periphery of the right lung. No large pleural effusion or evidence of pneumothorax. Lung apices not included in the field of view. IMPRESSION: 1. Endotracheal tube 7.4 cm from the carina. Enteric tube in place with tip visualized in the stomach. 2. Cardiomegaly. Bilateral ill-defined perihilar opacities, favoring pulmonary edema. Multifocal pneumonia or aspiration could have a similar appearance. Electronically Signed   By: Jeb Levering M.D.   On: 02/17/2018 03:45    Lab Data:  CBC: Recent Labs  Lab 02/24/18 0820 02/25/18 0703 02/25/18 6295 02/26/18 0848 02/27/18 0855 02/28/18 0631  WBC 11.3* 10.0  --  11.3* 8.8 8.9  HGB 13.8 12.3* 12.6* 12.4* 10.9* 11.0*  HCT 43.0 37.3* 37.0* 37.6* 33.9* 33.5*  MCV 100.5* 97.6  --  96.2 97.7 97.7  PLT 212 210  --  223 217 284   Basic Metabolic Panel: Recent Labs  Lab 02/23/18 0349 02/24/18 0348 02/25/18 0703 02/25/18 0707 02/26/18 0848 02/27/18 0546 02/27/18 0855 02/28/18 0516 02/28/18 0631 03/01/18 0801  NA 135 137  --  136 138  --  140  --  139  --   K 3.8 3.8  --  3.7 4.2  --  4.2  --  4.1  --   CL 96* 97*  --   --  101  --  103  --  104  --   CO2 22 22  --   --  18*  --  19*  --  18*  --   GLUCOSE 116* 114*  --  108* 114*  --  116*  --  121*  --   BUN 87* 55*  --   --  110*  --  125*  --  104*  --   CREATININE 6.82* 6.59*  --   --  10.89*  --  11.40*  --  10.14*  --   CALCIUM 9.4 9.6  --   --  9.1  --  8.7*  --  8.5*  --   MG 2.5* 2.4 2.5*  --  2.6* 2.6*  --  2.5*  --  2.5*  PHOS  --   --   --   --   --   --  11.8*  --  9.3*  --    GFR: Estimated Creatinine Clearance: 7.3 mL/min (A) (by C-G formula based on SCr of 10.14 mg/dL (H)). Liver Function Tests: Recent Labs  Lab 02/27/18 0855 02/28/18 0631  ALBUMIN 3.1* 3.0*   No results for input(s): LIPASE, AMYLASE in the last 168 hours. No results for input(s): AMMONIA in the last 168 hours. Coagulation Profile: Recent Labs  Lab 02/25/18 0703  INR 1.05   Cardiac Enzymes: No results for input(s): CKTOTAL, CKMB, CKMBINDEX, TROPONINI in the last 168 hours. BNP (last 3 results) No results for input(s): PROBNP in the last 8760 hours. HbA1C: No results for input(s): HGBA1C in the last 72 hours. CBG: Recent Labs  Lab 02/27/18 2113 02/28/18 1128 02/28/18 1713 03/01/18 0744 03/01/18 1202  GLUCAP 105* 100* 133* 139* 140*   Lipid Profile: Recent Labs    03/01/18 0801  TRIG 152*   Thyroid  Function Tests: No results for input(s): TSH, T4TOTAL, FREET4, T3FREE, THYROIDAB in the last 72 hours. Anemia Panel: No results for input(s): VITAMINB12, FOLATE, FERRITIN, TIBC, IRON, RETICCTPCT in the last 72 hours. Urine analysis:    Component Value Date/Time   COLORURINE YELLOW 02/17/2018 0540   APPEARANCEUR CLOUDY (A) 02/17/2018 0540   LABSPEC 1.010 02/17/2018 0540   PHURINE 5.0 02/17/2018 0540   GLUCOSEU NEGATIVE 02/17/2018 0540   HGBUR LARGE (A) 02/17/2018 0540   BILIRUBINUR NEGATIVE 02/17/2018 [place] NEGATIVE 02/17/2018 0540   PROTEINUR 100 (A) 02/17/2018 0540   NITRITE NEGATIVE 02/17/2018 0540   LEUKOCYTESUR NEGATIVE 02/17/2018 0540     Ripudeep Rai M.D. [facility] 03/01/2018, 3:06 PM  Pager: [phone] Between 7am to 7pm - call Pager - [phone]  After 7pm go to [email or web address] - password [email or web address]  Call night coverage person covering after 7pm

## 2018-03-01 NOTE — Progress Notes (Signed)
[facility] Progress Note   17 year oldBMPMHxof HIV/AIDS,ProstateCa, S/PBrachytherapy,CKD stage Vnot on dialysis,NonischemicCardiomyopathy with EF 29-79%GXQ Diastolic dysfunction, andHTN.  In ED patient was noted to be agonal and pulseless in parking lot. CPR started and continued for estimated 5-10 minutes before return of ROSC. Intubated. CXR consistent with pulmonary edema. Creatinine 5.32, LA 4.44. PCCM asked to admit.  He has been started on dialysis and is undergoing the CLIP process   Assessment/ Plan:    ESRD- new ESRD Undergoing CLIP process -Plan dialysis on MWF schedule -> will skip Fri and then dialyze tomorrow (Sat) before returning to the MWF regimen - Cath working better today -> HE WILL NEED HEPARIN BOLUS BEFORE HD  - GRAFT may clot off (much weaker bruit today); d/w dr Scot Dock and appears to be related to the low BP and EF. If he clots off in under a month he will be catheter dependent.   ANEMIA- Hb 11.9   MBD- difficulty tolerating phoslo Will stop at this point Phos in 4 range we will follow  HTN/VOL- Controlled   ACCESS-LUA AVG declot on 3/26; placed on3/25  OTHER- EF 20 %   HIV antiretrovirals    Subjective:   Feels better and is ambulating w/ a great appetite. Denies f/c/n.  No complaints   Objective:   BP 99/63 (BP Location: Right Arm)   Pulse 93   Temp 98.2 F (36.8 C) (Oral)   Resp 16   Ht 6\' 2"  (1.88 m)   Wt 84 kg (185 lb 3 oz)   SpO2 98%   BMI 23.78 kg/m   Intake/Output Summary (Last 24 hours) at 03/01/2018 1357 Last data filed at 03/01/2018 0900 Gross per 24 hour  Intake 246 ml  Output 225 ml  Net 21 ml   Weight change: -1.9 kg (-4 lb 3 oz)  Physical Exam: General appearance: alert, no distress Lungs: clear to auscultation bilaterally Heart: regular rate and rhythm and no murmur Abdomen: soft, non-tender; bowel sounds normal; no masses, no organomegaly Extremities: extremities  normal, atraumatic, no cyanosis or edema Pulses: 2+ and symmetric Neurologic: Mental status: Alert, oriented, thought content appropriate Access: RIJ TC, LUA AVG very weak bruit, left arm swollen    Imaging: No results found.  Labs: BMET Recent Labs  Lab 02/23/18 0349 02/24/18 0348 02/25/18 0707 02/26/18 0848 02/27/18 0855 02/28/18 0631  NA 135 137 136 138 140 139  K 3.8 3.8 3.7 4.2 4.2 4.1  CL 96* 97*  --  101 103 104  CO2 22 22  --  18* 19* 18*  GLUCOSE 116* 114* 108* 114* 116* 121*  BUN 87* 55*  --  110* 125* 104*  CREATININE 6.82* 6.59*  --  10.89* 11.40* 10.14*  CALCIUM 9.4 9.6  --  9.1 8.7* 8.5*  PHOS  --   --   --   --  11.8* 9.3*   CBC Recent Labs  Lab 02/25/18 0703 02/25/18 0707 02/26/18 0848 02/27/18 0855 02/28/18 0631  WBC 10.0  --  11.3* 8.8 8.9  HGB 12.3* 12.6* 12.4* 10.9* 11.0*  HCT 37.3* 37.0* 37.6* 33.9* 33.5*  MCV 97.6  --  96.2 97.7 97.7  PLT 210  --  223 217 224    Medications:    . carvedilol  6.25 mg Oral BID WC  . Chlorhexidine Gluconate Cloth  6 each Topical Daily  . dolutegravir  50 mg Oral Daily  . emtricitabine-tenofovir AF  1 tablet Oral Daily  . feeding supplement (PRO-STAT  SUGAR FREE 64)  30 mL Oral BID  . mouth rinse  15 mL Mouth Rinse BID  . scopolamine  1 patch Transdermal Q72H  . senna-docusate  1 tablet Oral BID  . sodium chloride flush  10-40 mL Intracatheter Q12H  . sodium chloride flush  3 mL Intravenous Q12H      Otelia Santee, MD 03/01/2018, 1:57 PM

## 2018-03-01 NOTE — Progress Notes (Signed)
PT Progress Note  Pt limited to short distance ambulation within room this session secondary to needing to have a BM. Pt still moving well with supervision for safety and use of RW. PT will continue to follow acutely and progress mobility and tolerated.  [place], [place], [place] [phone]     03/01/18 [id number]  PT Visit Information  Last PT Received On 03/01/18  Assistance Needed +1  History of Present Illness Pt is a 77 y.o. male admitted 02/17/18 with severe SOB and PEA arrest upon arrival requiring CPR before return to [place]; intubated 3/17-3/19. CXR consistent with pulmonary edema. CXR 3/18 shows bilateral pnuemonia. R IJ tunneled HD catheter placed 3/18 to initiate HD. 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   Precautions  Precautions Fall  Precaution Comments R IJ tunnelled cath  Restrictions  Weight Bearing Restrictions No  Pain Assessment  Pain Assessment No/denies pain  Cognition  Arousal/Alertness Awake/alert  Behavior During Therapy WFL for tasks assessed/performed  Overall Cognitive Status Within Functional Limits for tasks assessed  Bed Mobility  Overal bed mobility Modified Independent  Transfers  Overall transfer level Needs assistance  Equipment used Rolling walker (2 wheeled)  Transfers Sit to/from Stand  Sit to Stand Min guard  General transfer comment increased time and effort, use of momentum; sit<>stand from EOB x1 and from toilet x1  Ambulation/Gait  Ambulation/Gait assistance Supervision  Ambulation Distance (Feet) 15 Feet  Assistive device Rolling walker (2 wheeled)  Gait Pattern/deviations Step-through pattern;Decreased stride length;Trunk flexed  General Gait Details pt requesting to ambulate from bed to bathroom to have BM; pt steady with RW and supervision for safety  Gait velocity decreased  Gait velocity interpretation Below normal speed  for age/gender  Balance  Overall balance assessment Needs assistance  Sitting-balance support No upper extremity supported;Feet supported  Sitting balance-Leahy Scale Good  Standing balance support During functional activity;Single extremity supported  Standing balance-Leahy Scale Poor  Standing balance comment reliant on at least one UE support  PT - End of Session  Activity Tolerance Patient tolerated treatment well  Patient left with call bell/phone within reach;Other (comment) (sitting on toilet requesting extended time to have BM)  Nurse Communication Mobility status   PT - Assessment/Plan  PT Plan Current plan remains appropriate  PT Visit Diagnosis Other abnormalities of gait and mobility (R26.89)  PT Frequency (ACUTE ONLY) Min 3X/week  Follow Up Recommendations No PT follow up;Supervision/Assistance - 24 hour  PT equipment None recommended by PT  AM-PAC PT "6 Clicks" Daily Activity Outcome Measure  Difficulty turning over in bed (including adjusting bedclothes, sheets and blankets)? 4  Difficulty moving from lying on back to sitting on the side of the bed?  4  Difficulty sitting down on and standing up from a chair with arms (e.g., wheelchair, bedside commode, etc,.)? 1  Help needed moving to and from a bed to chair (including a wheelchair)? 3  Help needed walking in hospital room? 3  Help needed climbing 3-5 steps with a railing?  3  6 Click Score 18  Mobility G Code  CK  PT Goal Progression  Progress towards PT goals Progressing toward goals  Acute Rehab PT Goals  PT Goal Formulation With patient  Time For Goal Achievement 03/06/18  Potential to Achieve Goals Good  PT Time Calculation  PT Start Time (ACUTE ONLY) 0929  PT Stop Time (ACUTE ONLY) 0945  PT Time Calculation (  min) (ACUTE ONLY) 16 min  PT General Charges  $$ ACUTE PT VISIT 1 Visit  PT Treatments  $Therapeutic Activity 8-22 mins

## 2018-03-01 NOTE — Progress Notes (Signed)
Left upper arm has become firm, continues to have weak bruit negative thrill, Scot Dock made aware. Left arm is warm to the touch, good sensation good grip, palpable pulse

## 2018-03-02 LAB — CBC
HCT: 38.2 % — ABNORMAL LOW (ref 39.0–52.0)
Hemoglobin: 12.5 g/dL — ABNORMAL LOW (ref 13.0–17.0)
MCH: 32.3 pg (ref 26.0–34.0)
MCHC: 32.7 g/dL (ref 30.0–36.0)
MCV: 98.7 fL (ref 78.0–100.0)
PLATELETS: 230 10*3/uL (ref 150–400)
RBC: 3.87 MIL/uL — ABNORMAL LOW (ref 4.22–5.81)
RDW: 13.3 % (ref 11.5–15.5)
WBC: 18.4 10*3/uL — AB (ref 4.0–10.5)

## 2018-03-02 LAB — GLUCOSE, CAPILLARY
GLUCOSE-CAPILLARY: 145 mg/dL — AB (ref 65–99)
Glucose-Capillary: 112 mg/dL — ABNORMAL HIGH (ref 65–99)

## 2018-03-02 LAB — RENAL FUNCTION PANEL
Albumin: 3.3 g/dL — ABNORMAL LOW (ref 3.5–5.0)
Anion gap: 21 — ABNORMAL HIGH (ref 5–15)
BUN: 92 mg/dL — AB (ref 6–20)
CHLORIDE: 95 mmol/L — AB (ref 101–111)
CO2: 23 mmol/L (ref 22–32)
CREATININE: 11.79 mg/dL — AB (ref 0.61–1.24)
Calcium: 9.3 mg/dL (ref 8.9–10.3)
GFR calc Af Amer: 4 mL/min — ABNORMAL LOW (ref >60)
GFR calc non Af Amer: 4 mL/min — ABNORMAL LOW (ref >60)
Glucose, Bld: 134 mg/dL — ABNORMAL HIGH (ref 65–99)
Phosphorus: 11.3 mg/dL — ABNORMAL HIGH (ref 2.5–4.6)
Potassium: 4.5 mmol/L (ref 3.5–5.1)
Sodium: 139 mmol/L (ref 135–145)

## 2018-03-02 LAB — MAGNESIUM: Magnesium: 2.5 mg/dL — ABNORMAL HIGH (ref 1.7–2.4)

## 2018-03-02 MED ORDER — HEPARIN SODIUM (PORCINE) 1000 UNIT/ML DIALYSIS: 5000.0000 [IU] | Freq: Once | INTRAMUSCULAR | Status: DC

## 2018-03-02 NOTE — Progress Notes (Signed)
[facility] Progress Note   66 year oldBMPMHxof HIV/AIDS,ProstateCa, S/PBrachytherapy,CKD stage Vnot on dialysis,NonischemicCardiomyopathy with EF 49-44%HQP Diastolic dysfunction, andHTN.  In ED patient was noted to be agonal and pulseless in parking lot. CPR started and continued for estimated 5-10 minutes before return of ROSC. Intubated. CXR consistent with pulmonary edema. Creatinine 5.32, LA 4.44. PCCM asked to admit.  He has been started on dialysis and is undergoing the CLIP process   Assessment/ Plan:    ESRD- new ESRD Undergoing CLIP process -Plan dialysis on MWF schedule->  skipped Fri. - Resume MWF regimen after today. -Cath working better today -> HE WILL NEED HEPARIN BOLUS BEFORE HD  - GRAFT had a much weaker bruit yesterday but now it is thrombosed; d/w dr Scot Dock and appears to be related to the low BP and EF. He will be catheter dependent per Dr. Scot Dock.   ANEMIA- Hb 11.9   MBD- difficulty tolerating phoslo Will stop at this point Phos in 4 range we will follow  HTN/VOL- Controlled   ACCESS-LUA AVG declot on 3/26; placed on3/25  OTHER- EF 20 %   HIV antiretrovirals  Seen on dialysis @ 1015A Qb/qd 300/600 UF 900 ml (net 400) Given Midodrine and BP a little better.   Subjective:   Feels better and is ambulating w/ a great appetite. Denies f/c/n.  No complaints   Objective:   BP (!) 97/50   Pulse 90   Temp 98.3 F (36.8 C) (Oral)   Resp 18   Ht 6\' 2"  (1.88 m)   Wt 83.5 kg (184 lb 1.4 oz)   SpO2 100%   BMI 23.64 kg/m   Intake/Output Summary (Last 24 hours) at 03/02/2018 1030 Last data filed at 03/02/2018 0600 Gross per 24 hour  Intake 603 ml  Output 0 ml  Net 603 ml   Weight change: -0.7 kg (-1 lb 8.7 oz)  Physical Exam: General appearance: alert, no distress Lungs: clear to auscultation bilaterally Heart: regular rate and rhythm and no murmur Abdomen: soft, non-tender; bowel sounds  normal; no masses, no organomegaly Extremities: extremities normal, atraumatic, no cyanosis or edema Pulses: 2+ and symmetric Neurologic: Mental status: Alert, oriented, thought content appropriate Access: RIJ TC, LUA AVG thrombosed now. Left arm swollen  Imaging: No results found.  Labs: BMET Recent Labs  Lab 02/24/18 0348 02/25/18 0707 02/26/18 0848 02/27/18 0855 02/28/18 0631 03/02/18 0717  NA 137 136 138 140 139 139  K 3.8 3.7 4.2 4.2 4.1 4.5  CL 97*  --  101 103 104 95*  CO2 22  --  18* 19* 18* 23  GLUCOSE 114* 108* 114* 116* 121* 134*  BUN 55*  --  110* 125* 104* 92*  CREATININE 6.59*  --  10.89* 11.40* 10.14* 11.79*  CALCIUM 9.6  --  9.1 8.7* 8.5* 9.3  PHOS  --   --   --  11.8* 9.3* 11.3*   CBC Recent Labs  Lab 02/26/18 0848 02/27/18 0855 02/28/18 0631 03/02/18 0717  WBC 11.3* 8.8 8.9 18.4*  HGB 12.4* 10.9* 11.0* 12.5*  HCT 37.6* 33.9* 33.5* 38.2*  MCV 96.2 97.7 97.7 98.7  PLT 223 217 224 230    Medications:    . carvedilol  6.25 mg Oral BID WC  . Chlorhexidine Gluconate Cloth  6 each Topical Daily  . dolutegravir  50 mg Oral Daily  . emtricitabine-tenofovir AF  1 tablet Oral Daily  . feeding supplement (PRO-STAT SUGAR FREE 64)  30 mL Oral BID  .  heparin  5,000 Units Dialysis Once in dialysis  . mouth rinse  15 mL Mouth Rinse BID  . midodrine  5 mg Oral BID WC  . scopolamine  1 patch Transdermal Q72H  . senna-docusate  1 tablet Oral BID  . sodium chloride flush  10-40 mL Intracatheter Q12H  . sodium chloride flush  3 mL Intravenous Q12H      Otelia Santee, MD 03/02/2018, 10:30 AM

## 2018-03-02 NOTE — Progress Notes (Signed)
[facility]                                                                              Patient Demographics  David [name], is a 77 y.o. male, DOB - 1941-07-04, MLY:[id number]  Admit date - 02/17/2018   Admitting Physician Kandice Hams, MD  Outpatient Primary MD for the patient is [name], NP  Outpatient specialists:   LOS - 13  days   Medical records reviewed and are as summarized below:    Chief Complaint  Patient presents with  . 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  On the morning of admission, around 2-3 am, patient woke up with severe shortness of breath. Upon arrival to ED patient was noted to be agonal and pulseless in parking lot. CPR started and continued for estimated 5-10 minutes before return of ROSC. Intubated. CXR consistent with pulmonary edema. Creatinine 5.32, LA 4.44.  Patient was admitted by critical care service.  Creatinine 5.32.   Assessment & Plan    Principal Problem:  Acute respiratory failure with hypoxemia (HCC) with acute pulmonary edema -On arrival to ED, patient was noted to be agonal, pulseless, had PEA asystole arrest, had CPR and ACLS, ROSC 5-10 minutes.  Patient was intubated.  -Extubated on 3/19.  No issues, O2 sat is 99% on room air  Active Problems: PEA, asystole arrest ROSC in 5-10mins, Acute on chronic systolic and diastolic heart failure ([place]), nonischemic cardiomyopathy -Patient's cardiac arrest with neurological recovery.  Cardiology was consulted, patient also underwent HD. -BNP 800, 2D echo showed EF of 15-20%, slightly down from the prior report of 20-25% -Volume management  with HD, continue Coreg -Mildly elevated troponin likely secondary to demand ischemia and chest compressions.  EKG showed left bundle branch block, unclear chronicity. -Patient underwent cardiac cath on 3/20 which showed nonobstructive CAD and recommended continue medical therapy    Chronic obstructive pulmonary disease ([place]) -Chest x-ray showed cardiomegaly and perihilar opacities concerning for pulmonary edema -Currently improving, patient has been extubated, on room air -Continue volume management with HD, flutter valve, pulmonary hygiene    New ESRD (end stage renal disease) ([place]) -Creatinine 5.32 on admission, follows Dr. Posey Pronto in office. -Nephrology, vascular surgery following -HD per nephrology, had fistula placed on 3/25.   -Per nephrology, plan dialysis, MWF schedule, CLIP in process     HIV (human immunodeficiency virus infection) ([place]) -Undetectable HIV RNA, CD4 430 -Continue Tivicay and Descovy  Anemia of chronic disease -H&H stable  Prostate cancer status post brachytherapy  constipation -Continue Senokot, MiraLAX, constipation resolved  Code Status: Full CODE STATUS DVT Prophylaxis: Heparin subcu Family Communication: Discussed in detail with the patient, all imaging results, lab results explained to the patient    Disposition Plan: Once stable and CLIP process completed   Time Spent  in minutes  15 minutes  Procedures:   Intubated 3/17  Echocardiogram 3/18 EF 15-20%  RIJ tunneled catheter placement 3/18  Extubated 3/19  HD initiated 3/19  Heart catheterization 3/20, no culprit lesion  Consultants:   Nephrology  Cardiology  Vascular surgery  Antimicrobials:      Medications  Scheduled Meds: . carvedilol  6.25 mg Oral BID WC  . Chlorhexidine Gluconate Cloth  6 each Topical Daily  . dolutegravir  50 mg Oral Daily  . emtricitabine-tenofovir AF  1 tablet Oral Daily  . feeding supplement (PRO-STAT SUGAR FREE 64)  30 mL Oral BID  . mouth  rinse  15 mL Mouth Rinse BID  . midodrine  5 mg Oral BID WC  . scopolamine  1 patch Transdermal Q72H  . senna-docusate  1 tablet Oral BID  . sodium chloride flush  10-40 mL Intracatheter Q12H  . sodium chloride flush  3 mL Intravenous Q12H   Continuous Infusions: . sodium chloride    . sodium chloride     PRN Meds:.sodium chloride, acetaminophen, ondansetron (ZOFRAN) IV, oxyCODONE-acetaminophen, polyethylene glycol, sodium chloride flush, sodium chloride flush   Antibiotics   Anti-infectives (From admission, onward)   Start     Dose/Rate Route Frequency Ordered Stop   02/25/18 0600  ceFAZolin (ANCEF) IVPB 2g/100 mL premix     2 g 200 mL/hr over 30 Minutes Intravenous On call to O.R. 02/22/18 1100 02/25/18 0750   02/18/18 1330  ceFAZolin (ANCEF) IVPB 2g/100 mL premix     2 g 200 mL/hr over 30 Minutes Intravenous  Once 02/18/18 1212 02/18/18 1409   02/17/18 1200  dolutegravir (TIVICAY) tablet 50 mg     50 mg Oral Daily 02/17/18 0813     02/17/18 1200  emtricitabine-tenofovir AF (DESCOVY) 200-25 MG per tablet 1 tablet     1 tablet Oral Daily 02/17/18 0813          Subjective:   David [name] was seen and examined today.  No complaints, asking when he can go home.   Patient denies dizziness, shortness of breath, abdominal pain.     Objective:   Vitals:   03/02/18 1030 03/02/18 1100 03/02/18 1118 03/02/18 1237  BP: (!) 87/58 (!) 90/50 (!) 110/56 112/60  Pulse: 80 80 84 86  Resp: 18 18 18 20   Temp:   98 F (36.7 C) 98.2 F (36.8 C)  TempSrc:   Oral Oral  SpO2:   98% 99%  Weight:   83.5 kg (184 lb 1.4 oz)   Height:        Intake/Output Summary (Last 24 hours) at 03/02/2018 1258 Last data filed at 03/02/2018 1238 Gross per 24 hour  Intake 603 ml  Output 400 ml  Net 203 ml     Wt Readings from Last 3 Encounters:  03/02/18 83.5 kg (184 lb 1.4 oz)     Exam   General: Alert and oriented x 3, NAD  Eyes:  HEENT:  Atraumatic, normocephalic  Cardiovascular:  S1 S2 auscultated, RRR No pedal edema b/l  Respiratory: Clear to auscultation bilaterally, no wheezing, rales or rhonchi  Gastrointestinal: Soft, nontender, nondistended, + bowel sounds  Ext: no pedal edema bilaterally  Neuro:  Musculoskeletal: No digital cyanosis, clubbing  Skin: No rashes  Psych: Normal affect and demeanor, alert and oriented x3     Data Reviewed:  I have personally reviewed following labs and imaging studies  Micro Results No results found for this or any previous visit (from the past  240 hour(s)).  Radiology Reports Ct Head Wo Contrast  Result Date: 02/17/2018 CLINICAL DATA:  Altered level of consciousness.  Found unresponsive. EXAM: CT HEAD WITHOUT CONTRAST TECHNIQUE: Contiguous axial images were obtained from the base of the skull through the vertex without intravenous contrast. COMPARISON:  Head CT 07/31/2017, brain MRI 11/01/2017 FINDINGS: Brain: Generalized atrophy and chronic small vessel ischemia, similar to prior exams. No intracranial hemorrhage, mass effect, or midline shift. No hydrocephalus. The basilar cisterns are patent. No evidence of territorial infarct or acute ischemia. No evidence of cerebral edema. No extra-axial or intracranial fluid collection. Vascular: No hyperdense vessel or unexpected calcification. Skull: No fracture or focal lesion. Sinuses/Orbits: Metallic density in the right globe anterior chamber is unchanged from prior exam. Bilateral cataract resection. Minimal metallic debris in the anterior scalp. Mild mucosal thickening of the ethmoid air cells and sphenoid sinus. Mastoid air cells are clear. Other: None. IMPRESSION: 1.  No acute intracranial abnormality. 2. Generalized atrophy and chronic small vessel ischemia. 3. Metallic density in the right globe, chronic and unchanged from prior exam. Electronically Signed   By: Jeb Levering M.D.   On: 02/17/2018 05:51   Ct Chest Wo Contrast  Result Date: 02/17/2018 CLINICAL DATA:   Found unresponsive. Post CPR. Acute resp illness, > 50 years old EXAM: CT CHEST WITHOUT CONTRAST TECHNIQUE: Multidetector CT imaging of the chest was performed following the standard protocol without IV contrast. COMPARISON:  Chest radiograph earlier this day. Additional radiographs and CT in a different patient jacket, most recent CT 11/25/2017 FINDINGS: Cardiovascular: Aortic atherosclerosis. Coronary artery calcifications. Mild multi chamber cardiomegaly. Minimal pericardial fluid. Mediastinum/Nodes: Scattered mediastinal nodes not enlarged by size criteria. Lack of IV contrast limits assessment for hilar adenopathy. Endotracheal and enteric tubes appropriately positioned. Lungs/Pleura: Multifocal confluent, ground-glass, patchy and nodular opacities throughout both lungs. Consolidations in the dependent lungs appear confluent, and there is debris within the distal trachea and right mainstem bronchi, suggesting aspiration. No definite septal thickening. No pleural fluid. Upper Abdomen: Tip and side port of the enteric tube in the stomach. Bilateral renal atrophy with hyperdense lesions in both kidneys, likely cysts but incompletely characterized. Musculoskeletal: No acute finding. There are no acute or suspicious osseous abnormalities. IMPRESSION: 1. Multifocal bilateral consolidative, ground-glass, nodular airspace opacities throughout both lungs. Minimal debris in the distal trachea and right mainstem bronchus. Overall findings suggest pneumonia with probable aspiration. Degree of pulmonary edema is also considered. Ground-glass opacities in a patient with HIV can be seen with pneumocystis pneumonia , however consolidative opacities are the prominent pattern. 2. Cardiomegaly with aortic atherosclerosis. Aortic Atherosclerosis (ICD10-I70.0). Electronically Signed   By: Jeb Levering M.D.   On: 02/17/2018 06:02   Ir Fluoro Guide Cv Line Right  Result Date: 02/18/2018 CLINICAL DATA:  End-stage renal  disease, needs durable venous access for hemodialysis EXAM: TUNNELED HEMODIALYSIS CATHETER PLACEMENT WITH ULTRASOUND AND FLUOROSCOPIC GUIDANCE TECHNIQUE: The procedure, risks, benefits, and alternatives were explained to the spouse. Questions regarding the procedure were encouraged and answered. The spouse understands and consents to the procedure. As antibiotic prophylaxis, cefazolin 2 g was ordered pre-procedure and administered intravenously within one hour of incision.Patency of the right IJ vein was confirmed with ultrasound with image documentation. An appropriate skin site was determined. Region was prepped using maximum barrier technique including cap and mask, sterile gown, sterile gloves, large sterile sheet, and Chlorhexidine as cutaneous antisepsis. The region was infiltrated locally with 1% lidocaine. Intravenous Fentanyl and Versed were administered as conscious sedation during continuous monitoring  of the patient's level of consciousness and physiological / cardiorespiratory status by the radiology RN, with a total moderate sedation time of 10 minutes. Under real-time ultrasound guidance, the right IJ vein was accessed with a 21 gauge micropuncture needle; the needle tip within the vein was confirmed with ultrasound image documentation. Needle exchanged over the 018 guidewire for transitional dilator, which allowed advancement of a Benson wire into the IVC. Over this, an MPA catheter was advanced. A Palindrome 23 hemodialysis catheter was tunneled from the right anterior chest wall approach to the right IJ dermatotomy site. The MPA catheter was exchanged over an Amplatz wire for serial vascular dilators which allow placement of a peel-away sheath, through which the catheter was advanced under intermittent fluoroscopy, positioned with its tips in the proximal and midright atrium. Spot chest radiograph confirms good catheter position. No pneumothorax. Catheter was flushed and primed per protocol.  Catheter secured externally with O Prolene sutures. The right IJ dermatotomy site was closed with Dermabond. COMPLICATIONS: COMPLICATIONS None immediate FLUOROSCOPY TIME:  1 minutes; 9 mGy COMPARISON:  None IMPRESSION: 1. Technically successful placement of tunneled right IJ hemodialysis catheter with ultrasound and fluoroscopic guidance. Ready for routine use. ACCESS: Remains approachable for percutaneous intervention as needed. Electronically Signed   By: Lucrezia Europe M.D.   On: 02/18/2018 16:39   Ir US Guide Vasc Access Right  Result Date: 02/18/2018 CLINICAL DATA:  End-stage renal disease, needs durable venous access for hemodialysis EXAM: TUNNELED HEMODIALYSIS CATHETER PLACEMENT WITH ULTRASOUND AND FLUOROSCOPIC GUIDANCE TECHNIQUE: The procedure, risks, benefits, and alternatives were explained to the spouse. Questions regarding the procedure were encouraged and answered. The spouse understands and consents to the procedure. As antibiotic prophylaxis, cefazolin 2 g was ordered pre-procedure and administered intravenously within one hour of incision.Patency of the right IJ vein was confirmed with ultrasound with image documentation. An appropriate skin site was determined. Region was prepped using maximum barrier technique including cap and mask, sterile gown, sterile gloves, large sterile sheet, and Chlorhexidine as cutaneous antisepsis. The region was infiltrated locally with 1% lidocaine. Intravenous Fentanyl and Versed were administered as conscious sedation during continuous monitoring of the patient's level of consciousness and physiological / cardiorespiratory status by the radiology RN, with a total moderate sedation time of 10 minutes. Under real-time ultrasound guidance, the right IJ vein was accessed with a 21 gauge micropuncture needle; the needle tip within the vein was confirmed with ultrasound image documentation. Needle exchanged over the 018 guidewire for transitional dilator, which allowed  advancement of a Benson wire into the IVC. Over this, an MPA catheter was advanced. A Palindrome 23 hemodialysis catheter was tunneled from the right anterior chest wall approach to the right IJ dermatotomy site. The MPA catheter was exchanged over an Amplatz wire for serial vascular dilators which allow placement of a peel-away sheath, through which the catheter was advanced under intermittent fluoroscopy, positioned with its tips in the proximal and midright atrium. Spot chest radiograph confirms good catheter position. No pneumothorax. Catheter was flushed and primed per protocol. Catheter secured externally with O Prolene sutures. The right IJ dermatotomy site was closed with Dermabond. COMPLICATIONS: COMPLICATIONS None immediate FLUOROSCOPY TIME:  1 minutes; 9 mGy COMPARISON:  None IMPRESSION: 1. Technically successful placement of tunneled right IJ hemodialysis catheter with ultrasound and fluoroscopic guidance. Ready for routine use. ACCESS: Remains approachable for percutaneous intervention as needed. Electronically Signed   By: Lucrezia Europe M.D.   On: 02/18/2018 16:39   Dg Chest [facility]  Result Date: 02/19/2018 CLINICAL DATA:  Acute on chronic CHF, COPD, respiratory arrest and cardiac arrest. End-stage renal disease, HIV. EXAM: PORTABLE CHEST 1 VIEW COMPARISON:  Chest x-ray of February 18, 2018 FINDINGS: There has been interval placement of a dual-lumen dialysis catheter whose tip projects at the right cavoatrial junction. The lungs are well-expanded. The interstitial markings are slightly less conspicuous overall today. There is no significant pleural effusion. The cardiac silhouette is enlarged. The pulmonary vascularity is not engorged. The endotracheal tube tip lies 5.9 cm above the carina. The esophagogastric tube tip in proximal port project below the GE junction. IMPRESSION: Interval placement of a dialysis catheter via the right internal jugular approach without postprocedure complication. Stable  cardiomegaly with mild improvement in interstitial edema. The support tubes are in reasonable position. Electronically Signed   By: David  Martinique M.D.   On: 02/19/2018 07:31   Dg Chest Port 1 View  Result Date: 02/18/2018 CLINICAL DATA:  Acute respiratory failure, hypoxia, intubated patient. History of CHF, renal insufficiency, HIV, and prostate malignancy. EXAM: PORTABLE CHEST 1 VIEW COMPARISON:  Chest x-ray and chest CT scan of February 17, 2018 FINDINGS: The lungs are adequately inflated. The interstitial markings remain increased and are more conspicuous today on the left. The cardiac silhouette remains enlarged. The pulmonary vascularity is not engorged. The endotracheal tube tip lies 6.4 cm above the carina. The esophagogastric tube tip and proximal port project below the GE junction. IMPRESSION: Persistent bilateral pneumonia. Slight increased opacity noted on the left today. No overt pulmonary edema. The support tubes are in reasonable position where visualized. Electronically Signed   By: David  Martinique M.D.   On: 02/18/2018 08:35   Dg Chest Portable 1 View  Result Date: 02/17/2018 CLINICAL DATA:  Shortness of breath. Intubation and orogastric tube placement. EXAM: PORTABLE CHEST 1 VIEW COMPARISON:  None. FINDINGS: Endotracheal tube 7.4 cm from the carina. Enteric tube in place, tip visualized in the stomach. The heart is enlarged. There are bilateral ill-defined perihilar opacities. Minimal Kerley B-lines in the periphery of the right lung. No large pleural effusion or evidence of pneumothorax. Lung apices not included in the field of view. IMPRESSION: 1. Endotracheal tube 7.4 cm from the carina. Enteric tube in place with tip visualized in the stomach. 2. Cardiomegaly. Bilateral ill-defined perihilar opacities, favoring pulmonary edema. Multifocal pneumonia or aspiration could have a similar appearance. Electronically Signed   By: Jeb Levering M.D.   On: 02/17/2018 03:45    Lab  Data:  CBC: Recent Labs  Lab 02/25/18 0703 02/25/18 1740 02/26/18 0848 02/27/18 0855 02/28/18 0631 03/02/18 0717  WBC 10.0  --  11.3* 8.8 8.9 18.4*  HGB 12.3* 12.6* 12.4* 10.9* 11.0* 12.5*  HCT 37.3* 37.0* 37.6* 33.9* 33.5* 38.2*  MCV 97.6  --  96.2 97.7 97.7 98.7  PLT 210  --  223 217 224 814   Basic Metabolic Panel: Recent Labs  Lab 02/24/18 0348  02/25/18 0707 02/26/18 0848 02/27/18 0546 02/27/18 0855 02/28/18 0516 02/28/18 0631 03/01/18 0801 03/02/18 0717  NA 137  --  136 138  --  140  --  139  --  139  K 3.8  --  3.7 4.2  --  4.2  --  4.1  --  4.5  CL 97*  --   --  101  --  103  --  104  --  95*  CO2 22  --   --  18*  --  19*  --  18*  --  23  GLUCOSE 114*  --  108* 114*  --  116*  --  121*  --  134*  BUN 55*  --   --  110*  --  125*  --  104*  --  92*  CREATININE 6.59*  --   --  10.89*  --  11.40*  --  10.14*  --  11.79*  CALCIUM 9.6  --   --  9.1  --  8.7*  --  8.5*  --  9.3  MG 2.4   < >  --  2.6* 2.6*  --  2.5*  --  2.5* 2.5*  PHOS  --   --   --   --   --  11.8*  --  9.3*  --  11.3*   < > = values in this interval not displayed.   GFR: Estimated Creatinine Clearance: 6.3 mL/min (A) (by C-G formula based on SCr of 11.79 mg/dL (H)). Liver Function Tests: Recent Labs  Lab 02/27/18 0855 02/28/18 0631 03/02/18 0717  ALBUMIN 3.1* 3.0* 3.3*   No results for input(s): LIPASE, AMYLASE in the last 168 hours. No results for input(s): AMMONIA in the last 168 hours. Coagulation Profile: Recent Labs  Lab 02/25/18 0703  INR 1.05   Cardiac Enzymes: No results for input(s): CKTOTAL, CKMB, CKMBINDEX, TROPONINI in the last 168 hours. BNP (last 3 results) No results for input(s): PROBNP in the last 8760 hours. HbA1C: No results for input(s): HGBA1C in the last 72 hours. CBG: Recent Labs  Lab 03/01/18 0744 03/01/18 1202 03/01/18 1636 03/01/18 2222 03/02/18 1228  GLUCAP 139* 140* 82 94 112*   Lipid Profile: Recent Labs    03/01/18 0801  TRIG 152*    Thyroid Function Tests: No results for input(s): TSH, T4TOTAL, FREET4, T3FREE, THYROIDAB in the last 72 hours. Anemia Panel: No results for input(s): VITAMINB12, FOLATE, FERRITIN, TIBC, IRON, RETICCTPCT in the last 72 hours. Urine analysis:    Component Value Date/Time   COLORURINE YELLOW 02/17/2018 0540   APPEARANCEUR CLOUDY (A) 02/17/2018 0540   LABSPEC 1.010 02/17/2018 0540   PHURINE 5.0 02/17/2018 0540   GLUCOSEU NEGATIVE 02/17/2018 0540   HGBUR LARGE (A) 02/17/2018 0540   BILIRUBINUR NEGATIVE 02/17/2018 [place] NEGATIVE 02/17/2018 0540   PROTEINUR 100 (A) 02/17/2018 0540   NITRITE NEGATIVE 02/17/2018 0540   LEUKOCYTESUR NEGATIVE 02/17/2018 0540     Ripudeep Rai M.D. [facility] 03/02/2018, 12:58 PM  Pager: [phone] Between 7am to 7pm - call Pager - [phone]  After 7pm go to [email or web address] - password [email or web address]  Call night coverage person covering after 7pm

## 2018-03-03 ENCOUNTER — Other Ambulatory Visit: Payer: Self-pay

## 2018-03-03 DIAGNOSIS — I480 Paroxysmal atrial fibrillation: Secondary | ICD-10-CM

## 2018-03-03 DIAGNOSIS — I5023 Acute on chronic systolic (congestive) heart failure: Secondary | ICD-10-CM

## 2018-03-03 LAB — GLUCOSE, CAPILLARY
GLUCOSE-CAPILLARY: 113 mg/dL — AB (ref 65–99)
GLUCOSE-CAPILLARY: 141 mg/dL — AB (ref 65–99)
Glucose-Capillary: 218 mg/dL — ABNORMAL HIGH (ref 65–99)

## 2018-03-03 LAB — MAGNESIUM: MAGNESIUM: 2.4 mg/dL (ref 1.7–2.4)

## 2018-03-03 LAB — TSH: TSH: 1.576 u[IU]/mL (ref 0.350–4.500)

## 2018-03-03 LAB — C-REACTIVE PROTEIN: CRP: 19.8 mg/dL — AB (ref <1.0)

## 2018-03-03 LAB — URIC ACID: Uric Acid, Serum: 5.4 mg/dL (ref 4.4–7.6)

## 2018-03-03 LAB — SEDIMENTATION RATE: SED RATE: 41 mm/h — AB (ref 0–16)

## 2018-03-03 LAB — HEPARIN LEVEL (UNFRACTIONATED): HEPARIN UNFRACTIONATED: 0.66 [IU]/mL (ref 0.30–0.70)

## 2018-03-03 MED ORDER — SODIUM CHLORIDE 0.9 % IV BOLUS
250.0000 mL | Freq: Once | INTRAVENOUS | Status: AC
  Administered 2018-03-03: 250 mL via INTRAVENOUS

## 2018-03-03 MED ORDER — AMIODARONE LOAD VIA INFUSION
150.0000 mg | Freq: Once | INTRAVENOUS | Status: DC
  Filled 2018-03-03: qty 83.34

## 2018-03-03 MED ORDER — AMIODARONE HCL IN DEXTROSE 360-4.14 MG/200ML-% IV SOLN
60.0000 mg/h | INTRAVENOUS | Status: DC
  Filled 2018-03-03: qty 200

## 2018-03-03 MED ORDER — AMIODARONE HCL IN DEXTROSE 360-4.14 MG/200ML-% IV SOLN
30.0000 mg/h | INTRAVENOUS | Status: DC
  Filled 2018-03-03: qty 200

## 2018-03-03 MED ORDER — COLCHICINE 0.6 MG PO TABS
0.6000 mg | ORAL_TABLET | Freq: Every day | ORAL | Status: DC
  Administered 2018-03-03 – 2018-03-08 (×6): 0.6 mg via ORAL
  Filled 2018-03-03 (×8): qty 1

## 2018-03-03 MED ORDER — DIGOXIN 0.25 MG/ML IJ SOLN
0.2500 mg | Freq: Once | INTRAMUSCULAR | Status: AC
  Administered 2018-03-03: 0.25 mg via INTRAVENOUS
  Filled 2018-03-03: qty 1

## 2018-03-03 MED ORDER — WARFARIN - PHARMACIST DOSING INPATIENT
Freq: Every day | Status: DC
  Administered 2018-03-03 – 2018-03-07 (×4)

## 2018-03-03 MED ORDER — WARFARIN SODIUM 5 MG PO TABS
5.0000 mg | ORAL_TABLET | Freq: Once | ORAL | Status: AC
  Administered 2018-03-03: 5 mg via ORAL
  Filled 2018-03-03: qty 1

## 2018-03-03 MED ORDER — PREDNISONE 20 MG PO TABS
40.0000 mg | ORAL_TABLET | Freq: Every day | ORAL | Status: AC
  Administered 2018-03-03 – 2018-03-05 (×3): 40 mg via ORAL
  Filled 2018-03-03 (×3): qty 2

## 2018-03-03 MED ORDER — HEPARIN (PORCINE) IN NACL 100-0.45 UNIT/ML-% IJ SOLN
1200.0000 [IU]/h | INTRAMUSCULAR | Status: DC
  Administered 2018-03-03 – 2018-03-08 (×7): 1200 [IU]/h via INTRAVENOUS
  Filled 2018-03-03 (×6): qty 250

## 2018-03-03 MED ORDER — HEPARIN BOLUS VIA INFUSION
5000.0000 [IU] | Freq: Once | INTRAVENOUS | Status: AC
  Administered 2018-03-03: 5000 [IU] via INTRAVENOUS
  Filled 2018-03-03: qty 5000

## 2018-03-03 MED ORDER — AMIODARONE HCL 200 MG PO TABS
400.0000 mg | ORAL_TABLET | Freq: Three times a day (TID) | ORAL | Status: DC
  Administered 2018-03-03 – 2018-03-05 (×9): 400 mg via ORAL
  Filled 2018-03-03 (×10): qty 2

## 2018-03-03 NOTE — Consult Note (Signed)
Cardiology Consultation:   Patient ID: David [name]; [id number]; [birth year]   Admit date: 02/17/2018 Date of Consult: 03/03/2018  Primary Care Provider: [name], NP Primary Cardiologist: Skeet Latch, MD  Primary Electrophysiologist:  none   Patient Profile:   David [name] is a 77 y.o. male with a hx of PEA arrest who is being seen today for the evaluation of AFIB at the request of Dr. Tana Coast.  History of Present Illness:   David [name] is a 77 year old male with HIV/AIDS, prostate cancer status post brachii therapy, chronic kidney disease stage V, nonischemic cardiomyopathy, prior hypertension who presented to the emergency department on 02/17/18 after waking up feeling severely short of breath and was noted to be agonal and pulseless in parking lot, CPR was started, after 5-10 minutes he had return of spontaneous circulation, he was intubated, chest x-ray showed pulmonary edema and he was determined to have an asystolic arrest.  Cardiac catheterization took place that showed nonobstructive CAD.  Ejection fraction 15-20% on echocardiogram.  He has had a relatively long recovery but currently has been doing fairly well on the floor.  He is currently undergoing hemodialysis Monday Wednesday Friday.  This morning, he was noted to be in rapid atrial fibrillation which is a new rhythm for him with heart rates of 110-140.  Originally, patient was put on carvedilol 6.25 mg twice a day but his blood pressure was down into the 90 systolic so this was held.  EKG demonstrates rapid atrial fibrillation, personally viewed.  Telemetry personally reviewed shows atrial fibrillation.  In speaking with the patient, he is asymptomatic from this, he does not feel any palpitations, he is comfortable in bed without any shortness of breath chest pain.  Non-smoker.  Denies any family history of coronary artery disease or atrial fibrillation.  His main complaint is pain from gout.  He has  not had any bleeding issues.    Past Medical History:  Diagnosis Date  . Acute on chronic systolic and diastolic heart failure, NYHA class 4 (David [name])   . Arthritis    "hands, right knee, feet" (02/21/2018)  . Cardiac arrest ([place]) 02/17/2018  . CHF (congestive heart failure) ([place])   . Chronic lower back pain   . CKD (chronic kidney disease) stage V requiring chronic dialysis ([place])   . ESRD on dialysis ([place])    started 02/2018  . Gout    daily RX (02/21/2018)  . HIV (human immunodeficiency virus infection) ([place])   . Hypertension   . Pneumonia 11/2017  . Prostate cancer [facility])     Past Surgical History:  Procedure Laterality Date  . AV FISTULA PLACEMENT Right 10/13/2016   [name] [id number]  . AV FISTULA PLACEMENT Left 02/25/2018   Procedure: INSERTION OF ARTERIOVENOUS (AV) GORE-TEX GRAFT LEFT UPPER ARM;  Surgeon: Angelia Mould, MD;  Location: [facility];  Service: Vascular;  Laterality: Left;  . BASCILIC VEIN TRANSPOSITION Left 1/75/1025 Bascilic vein transposition (Left)   [name] [id number]  . CARDIAC CATHETERIZATION  2004  . CATARACT EXTRACTION, BILATERAL Bilateral   . EYE SURGERY    . GLAUCOMA SURGERY Bilateral   . INSERTION PROSTATE RADIATION SEED    . IR FLUORO GUIDE CV LINE RIGHT  02/18/2018  . IR US GUIDE VASC ACCESS RIGHT  02/18/2018  . LEFT HEART CATH AND CORONARY ANGIOGRAPHY N/A 02/20/2018   Procedure: LEFT HEART CATH AND CORONARY ANGIOGRAPHY;  Surgeon: Jettie Booze, MD;  Location: [place] CV [facility];  Service: Cardiovascular;  Laterality: N/A;  .  THROMBECTOMY W/ EMBOLECTOMY Left 02/26/2018   Procedure: THROMBECTOMY ARTERIOVENOUS GRAFT;  Surgeon: Angelia Mould, MD;  Location: [place];  Service: Vascular;  Laterality: Left;  . ULTRASOUND GUIDANCE FOR VASCULAR ACCESS  02/20/2018   Procedure: Ultrasound Guidance For Vascular Access;  Surgeon: Jettie Booze, MD;  Location: [place] [facility];  Service: Cardiovascular;;     Home Medications:  Prior to Admission  medications   Medication Sig Start Date End Date Taking? Authorizing Provider  allopurinol (ZYLOPRIM) 100 MG tablet Take 100 mg by mouth every morning. 01/23/18  Yes [provider]  amLODipine (NORVASC) 10 MG tablet Take 10 mg by mouth every morning. 12/05/17  Yes [provider]  aspirin EC 81 MG tablet Take 81 mg by mouth daily.   Yes [provider]  brimonidine (ALPHAGAN) 0.15 % ophthalmic solution Place 1 drop into the left eye 2 (two) times daily. 11/15/17  Yes [provider]  carvedilol (COREG) 25 MG tablet Take 25 mg by mouth 2 (two) times daily. 12/05/17  Yes [provider]  colchicine 0.6 MG tablet Take 0.6 mg by mouth See admin instructions. Taker 1 tablet (0.6mg ) once daily for 7 days just when having flare up.   Yes [provider]  DESCOVY 200-25 MG tablet Take 1 tablet by mouth every morning. 01/20/18  Yes [provider]  dorzolamide-timolol (COSOPT) 22.3-6.8 MG/ML ophthalmic solution Place 1 drop into the left eye 2 (two) times daily. 12/17/17  Yes [provider]  furosemide (LASIX) 80 MG tablet Take 160 mg by mouth 2 (two) times daily. 01/22/18  Yes [provider]  latanoprost (XALATAN) 0.005 % ophthalmic solution Place 1 drop into the left eye at bedtime. 11/14/17  Yes [provider]  potassium chloride (K-DUR) 10 MEQ tablet Take 20 mEq by mouth daily.   Yes [provider]  terazosin (HYTRIN) 10 MG capsule Take 10 mg by mouth at bedtime.   Yes [provider]  TIVICAY 50 MG tablet Take 50 mg by mouth every morning. 01/20/18  Yes [provider]  Vitamin D, Ergocalciferol, (DRISDOL) 50000 units CAPS capsule Take 50,000 Units by mouth once a week. 02/11/18  Yes [provider]  sulfamethoxazole-trimethoprim (BACTRIM DS,SEPTRA DS) 800-160 MG tablet Take 1 tablet by mouth 3 (three) times a week.    [provider]    Inpatient Medications: Scheduled  Meds: . amiodarone  400 mg Oral TID  . Chlorhexidine Gluconate Cloth  6 each Topical Daily  . colchicine  0.6 mg Oral Daily  . dolutegravir  50 mg Oral Daily  . emtricitabine-tenofovir AF  1 tablet Oral Daily  . feeding supplement (PRO-STAT SUGAR FREE 64)  30 mL Oral BID  . mouth rinse  15 mL Mouth Rinse BID  . midodrine  5 mg Oral BID WC  . predniSONE  40 mg Oral Q breakfast  . scopolamine  1 patch Transdermal Q72H  . senna-docusate  1 tablet Oral BID   Continuous Infusions: . sodium chloride 250 mL (03/03/18 0746)   PRN Meds: oxyCODONE-acetaminophen, polyethylene glycol  Allergies:   No Known Allergies  Social History:   Social History   Socioeconomic History  . Marital status: Married    Spouse name: Not on file  . Number of children: Not on file  . Years of education: Not on file  . Highest education level: Not on file  Occupational History  . Not on file  Social Needs  . Financial resource strain:  Not on file  . Food insecurity:    Worry: Not on file    Inability: Not on file  . Transportation needs:    Medical: Not on file    Non-medical: Not on file  Tobacco Use  . Smoking status: Former Smoker    Packs/day: 1.00    Years: 30.00    Pack years: 30.00    Types: Cigarettes    Last attempt to quit: 2006    Years since quitting: 13.2  . Smokeless tobacco: Never Used  Substance and Sexual Activity  . Alcohol use: Not Currently  . Drug use: Never  . Sexual activity: Not Currently  Lifestyle  . Physical activity:    Days per week: Not on file    Minutes per session: Not on file  . Stress: Not on file  Relationships  . Social connections:    Talks on phone: Not on file    Gets together: Not on file    Attends religious service: Not on file    Active member of club or organization: Not on file    Attends meetings of clubs or organizations: Not on file    Relationship status: Not on file  . Intimate partner violence:    Fear of current or ex partner: Not  on file    Emotionally abused: Not on file    Physically abused: Not on file    Forced sexual activity: Not on file  Other Topics Concern  . Not on file  Social History Narrative  . Not on file    Family History:   History reviewed. No pertinent family history.  No early CAD  ROS:  Please see the history of present illness.   All other ROS reviewed and negative.     Physical Exam/Data:   Vitals:   03/02/18 2025 03/03/18 0425 03/03/18 0656 03/03/18 0742  BP: (!) 92/55 (!) 95/59 (!) 91/58 103/85  Pulse: 89 (!) 119 (!) 129 (!) 137  Resp: 17 18 (!) 22 18  Temp: 98.4 F (36.9 C) 98.5 F (36.9 C)  98 F (36.7 C)  TempSrc:    Oral  SpO2: 100% 98%  97%  Weight: 182 lb 15.7 oz (83 kg)     Height:        Intake/Output Summary (Last 24 hours) at 03/03/2018 0811 Last data filed at 03/03/2018 0600 Gross per 24 hour  Intake 0 ml  Output 400 ml  Net -400 ml   Filed Weights   03/02/18 0717 03/02/18 1118 03/02/18 2025  Weight: 184 lb 1.4 oz (83.5 kg) 184 lb 1.4 oz (83.5 kg) 182 lb 15.7 oz (83 kg)   Body mass index is 23.49 kg/m.  General:  Well nourished, well developed, in no acute distress HEENT: normal Lymph: no adenopathy Neck: no JVD Endocrine:  No thryomegaly Vascular: No carotid bruits; FA pulses 2+ bilaterally without bruits  Cardiac:  normal S1, S2; irregularly irregular, mildly tachycardic; no murmur  Lungs:  clear to auscultation bilaterally, no wheezing, rhonchi or rales  Abd: soft, nontender, no hepatomegaly  Ext: no edema Musculoskeletal:  No deformities, BUE and BLE strength normal and equal Skin: warm and dry  Neuro:  CNs 2-12 intact, no focal abnormalities noted Psych:  Normal affect   EKG:  The EKG was personally reviewed and demonstrates: Atrial fibrillation with rapid ventricular response heart rate 130 with borderline intraventricular conduction delay  Telemetry:  Telemetry was personally reviewed and demonstrates: Atrial fibrillation with rapid  ventricular response  heart rate ranging from 110-140  Relevant CV Studies:  - Left ventricle: The cavity size was moderately dilated. There was   moderate concentric hypertrophy. Systolic function was normal.   The estimated ejection fraction was in the range of 15% to 20%.   Doppler parameters are consistent with abnormal left ventricular   relaxation (grade 1 diastolic dysfunction). - Ventricular septum: Septal motion showed paradox. - Aortic valve: There was mild regurgitation. - Left atrium: The atrium was mildly dilated.  Impressions:  - No prior study available for comparison. LV is moderately dilated   with severely decreased LVEF estimated at 15-20%. There is   diffuse hypokinesis with akinesis at the inferior and   inferolateral walls. RV is poorly visualized but appears to have   normal size and systolic function.  Cardiac catheterization during this admission-nonobstructive CAD.  Laboratory Data:  Chemistry Recent Labs  Lab 02/27/18 0855 02/28/18 0631 03/02/18 0717  NA 140 139 139  K 4.2 4.1 4.5  CL 103 104 95*  CO2 19* 18* 23  GLUCOSE 116* 121* 134*  BUN 125* 104* 92*  CREATININE 11.40* 10.14* 11.79*  CALCIUM 8.7* 8.5* 9.3  GFRNONAA 4* 4* 4*  GFRAA 4* 5* 4*  ANIONGAP 18* 17* 21*    Recent Labs  Lab 02/27/18 0855 02/28/18 0631 03/02/18 0717  ALBUMIN 3.1* 3.0* 3.3*   Hematology Recent Labs  Lab 02/27/18 0855 02/28/18 0631 03/02/18 0717  WBC 8.8 8.9 18.4*  RBC 3.47* 3.43* 3.87*  HGB 10.9* 11.0* 12.5*  HCT 33.9* 33.5* 38.2*  MCV 97.7 97.7 98.7  MCH 31.4 32.1 32.3  MCHC 32.2 32.8 32.7  RDW 13.0 13.1 13.3  PLT 217 224 230   Cardiac EnzymesNo results for input(s): TROPONINI in the last 168 hours. No results for input(s): TROPIPOC in the last 168 hours.  BNPNo results for input(s): BNP, PROBNP in the last 168 hours.  DDimer No results for input(s): DDIMER in the last 168 hours.  Radiology/Studies:  No results found.  Assessment and Plan:     New onset paroxysmal atrial fibrillation -No prior history of this per patient. -Currently asymptomatic, no palpitations, no chest pain, no shortness of breath, no syncope.  Blood pressure 90-100, asymptomatic. - We will go ahead and load him with p.o. amiodarone 400 mg p.o. 3 times daily.  Watch for any signs of bradycardia.  Watch for any side effects such as nausea.  Avoid use of digoxin and end-stage renal disease patients.  Unable to utilize to utilize carvedilol because of relative hypotension especially during hemodialysis. -Given his chads score of at least 3 for age 24 or greater and heart failure, he deserves anticoagulation.  I agree with IV heparin as well as pharmacy consult for warfarin since he is on hemodialysis.  He does not have any upcoming procedures.  Stroke protection.  We discussed.  Nonischemic dilated cardiomyopathy -EF 15-20%.  Once again unable to utilize traditional beta-blocker and other heart failure medications because of relative hypotension.  Pulseless electrical activity, asystolic arrest with return of spontaneous circulation and 5-10 minutes. -Cardiac catheterization 02/20/18 showed nonobstructive CAD.  Arrest respiratory.  He was found to be agonal and pulseless in a parking lot.  He woke up with severe shortness of breath.  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  Undetectable HIV RNA.  Gout -Seem to be his major complaint.  Hypotension -midodrine.   We will keep an eye on him.  For questions or updates, please contact [place]  Please consult [email or web address] for contact info under Cardiology/STEMI.   Signed, Candee Furbish, MD  03/03/2018 8:11 AM

## 2018-03-03 NOTE — Progress Notes (Signed)
ANTICOAGULATION CONSULT NOTE - Initial Consult  Pharmacy Consult for heparin/warfarin Indication: atrial fibrillation  No Known Allergies  Patient Measurements: Height: 6\' 2"  (188 cm) Weight: 182 lb 15.7 oz (83 kg) IBW/kg (Calculated) : 82.2 Heparin Dosing Weight: 83 kg   Vital Signs: Temp: 98 F (36.7 C) (03/31 0742) Temp Source: Oral (03/31 0742) BP: 103/85 (03/31 0742) Pulse Rate: 137 (03/31 0742)  Labs: Recent Labs    03/02/18 0717  HGB 12.5*  HCT 38.2*  PLT 230  CREATININE 11.79*    Estimated Creatinine Clearance: 6.3 mL/min (A) (by C-G formula based on SCr of 11.79 mg/dL (H)).   Medical History: Past Medical History:  Diagnosis Date  . Acute on chronic systolic and diastolic heart failure, NYHA class 4 ([place])   . Arthritis    "hands, right knee, feet" (02/21/2018)  . Cardiac arrest ([place]) 02/17/2018  . CHF (congestive heart failure) ([place])   . Chronic lower back pain   . CKD (chronic kidney disease) stage V requiring chronic dialysis ([place])   . ESRD on dialysis ([place])    started 02/2018  . Gout    daily RX (02/21/2018)  . HIV (human immunodeficiency virus infection) ([place])   . Hypertension   . Pneumonia 11/2017  . Prostate cancer ([place])     Medications:  Infusions:  . amiodarone     Followed by  . amiodarone    . sodium chloride 250 mL (03/03/18 0746)    Assessment: 23 YOM with PEA arrest, cath showing nonobstructive CAD, now found in A-fib and pharmacy consulted for heparin and warfarin.  No anticoagulation PTA, and H/H has been slightly low but stable with normal plts.  INR 1.05 on 3/25, and amiodarone also started.    Goal of Therapy:  INR 2-3 Heparin level 0.3-0.7 units/ml Monitor platelets by anticoagulation protocol: Yes   Plan:  Heparin bolus 5000 units, followed by heparin gtt at 1200 units/hr F/u level at 1700 Warfarin 5 mg x 1 tonight (starting lower d/t amiodarone) Monitor daily heparin level, INR, CBC, s/s bleeding  Bertis Ruddy,  PharmD Pharmacy Resident Pager #: [phone] 03/03/2018 7:59 AM

## 2018-03-03 NOTE — Progress Notes (Signed)
Pt heart rate sustaining 110-140s. A-fib on monitor. VS- 98.5-119-18-95/59. O2 sat. 98% on RA. Pt resting in bed. Easily aroused. Denies CP or SOB. Jeannette Corpus, NP notified

## 2018-03-03 NOTE — Progress Notes (Signed)
Pt's HR is maintained at 100-120"s mostly 100's, asymptomatic, vitals stable, IV heparin continue @12cc /hr, wife is in bed side and is updating, will continue to monitor the patient.  Palma Holter, RN

## 2018-03-03 NOTE — Progress Notes (Signed)
[facility]                                                                              Patient Demographics  David [name], is a 77 y.o. male, DOB - 1941-06-27, DSK:[id number]  Admit date - 02/17/2018   Admitting Physician Kandice Hams, MD  Outpatient Primary MD for the patient is [name], NP  Outpatient specialists:   LOS - 14  days   Medical records reviewed and are as summarized below:    Chief Complaint  Patient presents with  . 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  On the morning of admission, around 2-3 am, patient woke up with severe shortness of breath. Upon arrival to ED patient was noted to be agonal and pulseless in parking lot. CPR started and continued for estimated 5-10 minutes before return of ROSC. Intubated. CXR consistent with pulmonary edema. Creatinine 5.32, LA 4.44.  Patient was admitted by critical care service.  Creatinine 5.32.   Assessment & Plan    Principal Problem: New onset Atrial fibrillation:  -This morning patient noted to be in rapid atrial fib with heart rate 110-140's, patient was on Coreg, BP dropped down to 90s, hence Coreg held. -Unable to give Cardizem, will give 250 cc bolus (ESRD patient), stat EKG showed rapid atrial fibrillation, QTC 482 - d/w Dr Marlou Porch, patient is asymptomatic start on oral amiodarone 400 mg TID, heparin drip and Coumadin per pharmacy.  - will restart coreg once BP is stable  Active Problems: PEA, asystole arrest ROSC in 5-10mins, Acute on chronic systolic and diastolic heart failure ([place]), nonischemic cardiomyopathy -Patient had cardiac arrest with  neurological recovery.  Cardiology was consulted, patient also underwent HD. -BNP 800, 2D echo showed EF of 15-20%, slightly down from the prior report of 20-25% -Volume management with HD -Mildly elevated troponin likely secondary to demand ischemia and chest compressions.  EKG showed left bundle branch block, unclear chronicity. -Patient underwent cardiac cath on 3/20 which showed nonobstructive CAD and recommended continue medical therapy -Restart Coreg once BP is stable, patient was on 6.13m BID   Acute respiratory failure with hypoxemia ([place] with acute pulmonary edema -On arrival to ED, patient was noted to be agonal, pulseless, had PEA asystole arrest, had CPR and ACLS, ROSC 5-10 minutes.  Patient was intubated.  -Extubated on 3/19.  No issues, O2 sat is 97% on room air   Acute gout in feet -Obtain ESR, CRP, uric acid - no NSAIDs, based on colchicine and prednisone    Chronic obstructive pulmonary disease (HCC) -Chest x-ray showed cardiomegaly and perihilar opacities concerning for pulmonary edema -Currently improving, patient has been extubated, on room air -Continue volume management with HD, flutter valve, pulmonary hygiene    New ESRD (end stage renal disease) ([place] -Creatinine 5.32 on admission,  follows Dr. Posey Pronto in office. -Nephrology, vascular surgery following -HD per nephrology, had fistula placed on 3/25.   -Per nephrology, plan dialysis, MWF schedule, CLIP in process     HIV (human immunodeficiency virus infection) ([place]) -Undetectable HIV RNA, CD4 430 -Continue Tivicay and Descovy  Anemia of chronic disease -H&H stable, follow closely with anticoagulation  Prostate cancer status post brachytherapy  constipation -Continue Senokot, MiraLAX, constipation resolved  Code Status: Full CODE STATUS DVT Prophylaxis: Heparin drip Family Communication: Discussed in detail with the patient, all imaging results, lab results explained to the patient    Disposition  Plan: Once stable and CLIP process completed   Time Spent in minutes 45 minutes  Procedures:   Intubated 3/17  Echocardiogram 3/18 EF 15-20%  RIJ tunneled catheter placement 3/18  Extubated 3/19  HD initiated 3/19  Heart catheterization 3/20, no culprit lesion  Consultants:   Nephrology  Cardiology  Vascular surgery  Antimicrobials:      Medications  Scheduled Meds: . amiodarone  150 mg Intravenous Once  . Chlorhexidine Gluconate Cloth  6 each Topical Daily  . dolutegravir  50 mg Oral Daily  . emtricitabine-tenofovir AF  1 tablet Oral Daily  . feeding supplement (PRO-STAT SUGAR FREE 64)  30 mL Oral BID  . mouth rinse  15 mL Mouth Rinse BID  . midodrine  5 mg Oral BID WC  . scopolamine  1 patch Transdermal Q72H  . senna-docusate  1 tablet Oral BID   Continuous Infusions: . amiodarone     Followed by  . amiodarone    . sodium chloride 250 mL (03/03/18 0746)   PRN Meds:.acetaminophen, ondansetron (ZOFRAN) IV, oxyCODONE-acetaminophen, polyethylene glycol   Antibiotics   Anti-infectives (From admission, onward)   Start     Dose/Rate Route Frequency Ordered Stop   02/25/18 0600  ceFAZolin (ANCEF) IVPB 2g/100 mL premix     2 g 200 mL/hr over 30 Minutes Intravenous On call to O.R. 02/22/18 1100 02/25/18 0750   02/18/18 1330  ceFAZolin (ANCEF) IVPB 2g/100 mL premix     2 g 200 mL/hr over 30 Minutes Intravenous  Once 02/18/18 1212 02/18/18 1409   02/17/18 1200  dolutegravir (TIVICAY) tablet 50 mg     50 mg Oral Daily 02/17/18 0813     02/17/18 1200  emtricitabine-tenofovir AF (DESCOVY) 200-25 MG per tablet 1 tablet     1 tablet Oral Daily 02/17/18 0813          Subjective:   David [name] was seen and examined today.  Patient noted to be in rapid atrial fibrillation since 5 AM.  On exam, denies any chest pain, shortness of breath, palpitations.  Complaining of gout pain in his feet.   Patient denies dizziness, shortness of breath, abdominal pain.      Objective:   Vitals:   03/02/18 2025 03/03/18 0425 03/03/18 0656 03/03/18 0742  BP: (!) 92/55 (!) 95/59 (!) 91/58 103/85  Pulse: 89 (!) 119 (!) 129 (!) 137  Resp: 17 18 (!) 22 18  Temp: 98.4 F (36.9 C) 98.5 F (36.9 C)  98 F (36.7 C)  TempSrc:    Oral  SpO2: 100% 98%  97%  Weight: 83 kg (182 lb 15.7 oz)     Height:        Intake/Output Summary (Last 24 hours) at 03/03/2018 0751 Last data filed at 03/03/2018 0600 Gross per 24 hour  Intake 0 ml  Output 400 ml  Net -400 ml     Wt  Readings from Last 3 Encounters:  03/02/18 83 kg (182 lb 15.7 oz)     Exam   General: Alert and oriented x 3, NAD  Eyes:   HEENT:    Cardiovascular: Tachycardia, irregularly regular No pedal edema b/l  Respiratory: Clear to auscultation bilaterally, no wheezing, rales or rhonchi  Gastrointestinal: Soft, nontender, nondistended, + bowel sounds  Ext: no pedal edema bilaterally  Neuro: no new deficits  Musculoskeletal: No digital cyanosis, clubbing  Skin: No rashes  Psych: Normal affect and demeanor, alert and oriented x3     Data Reviewed:  I have personally reviewed following labs and imaging studies  Micro Results No results found for this or any previous visit (from the past 240 hour(s)).  Radiology Reports Ct Head Wo Contrast  Result Date: 02/17/2018 CLINICAL DATA:  Altered level of consciousness.  Found unresponsive. EXAM: CT HEAD WITHOUT CONTRAST TECHNIQUE: Contiguous axial images were obtained from the base of the skull through the vertex without intravenous contrast. COMPARISON:  Head CT 07/31/2017, brain MRI 11/01/2017 FINDINGS: Brain: Generalized atrophy and chronic small vessel ischemia, similar to prior exams. No intracranial hemorrhage, mass effect, or midline shift. No hydrocephalus. The basilar cisterns are patent. No evidence of territorial infarct or acute ischemia. No evidence of cerebral edema. No extra-axial or intracranial fluid collection. Vascular: No  hyperdense vessel or unexpected calcification. Skull: No fracture or focal lesion. Sinuses/Orbits: Metallic density in the right globe anterior chamber is unchanged from prior exam. Bilateral cataract resection. Minimal metallic debris in the anterior scalp. Mild mucosal thickening of the ethmoid air cells and sphenoid sinus. Mastoid air cells are clear. Other: None. IMPRESSION: 1.  No acute intracranial abnormality. 2. Generalized atrophy and chronic small vessel ischemia. 3. Metallic density in the right globe, chronic and unchanged from prior exam. Electronically Signed   By: Jeb Levering M.D.   On: 02/17/2018 05:51   Ct Chest Wo Contrast  Result Date: 02/17/2018 CLINICAL DATA:  Found unresponsive. Post CPR. Acute resp illness, > 40 years old EXAM: CT CHEST WITHOUT CONTRAST TECHNIQUE: Multidetector CT imaging of the chest was performed following the standard protocol without IV contrast. COMPARISON:  Chest radiograph earlier this day. Additional radiographs and CT in a different patient jacket, most recent CT 11/25/2017 FINDINGS: Cardiovascular: Aortic atherosclerosis. Coronary artery calcifications. Mild multi chamber cardiomegaly. Minimal pericardial fluid. Mediastinum/Nodes: Scattered mediastinal nodes not enlarged by size criteria. Lack of IV contrast limits assessment for hilar adenopathy. Endotracheal and enteric tubes appropriately positioned. Lungs/Pleura: Multifocal confluent, ground-glass, patchy and nodular opacities throughout both lungs. Consolidations in the dependent lungs appear confluent, and there is debris within the distal trachea and right mainstem bronchi, suggesting aspiration. No definite septal thickening. No pleural fluid. Upper Abdomen: Tip and side port of the enteric tube in the stomach. Bilateral renal atrophy with hyperdense lesions in both kidneys, likely cysts but incompletely characterized. Musculoskeletal: No acute finding. There are no acute or suspicious osseous  abnormalities. IMPRESSION: 1. Multifocal bilateral consolidative, ground-glass, nodular airspace opacities throughout both lungs. Minimal debris in the distal trachea and right mainstem bronchus. Overall findings suggest pneumonia with probable aspiration. Degree of pulmonary edema is also considered. Ground-glass opacities in a patient with HIV can be seen with pneumocystis pneumonia , however consolidative opacities are the prominent pattern. 2. Cardiomegaly with aortic atherosclerosis. Aortic Atherosclerosis (ICD10-I70.0). Electronically Signed   By: Jeb Levering M.D.   On: 02/17/2018 06:02   Ir Fluoro Guide Cv Line Right  Result Date: 02/18/2018 CLINICAL DATA:  End-stage  renal disease, needs durable venous access for hemodialysis EXAM: TUNNELED HEMODIALYSIS CATHETER PLACEMENT WITH ULTRASOUND AND FLUOROSCOPIC GUIDANCE TECHNIQUE: The procedure, risks, benefits, and alternatives were explained to the spouse. Questions regarding the procedure were encouraged and answered. The spouse understands and consents to the procedure. As antibiotic prophylaxis, cefazolin 2 g was ordered pre-procedure and administered intravenously within one hour of incision.Patency of the right IJ vein was confirmed with ultrasound with image documentation. An appropriate skin site was determined. Region was prepped using maximum barrier technique including cap and mask, sterile gown, sterile gloves, large sterile sheet, and Chlorhexidine as cutaneous antisepsis. The region was infiltrated locally with 1% lidocaine. Intravenous Fentanyl and Versed were administered as conscious sedation during continuous monitoring of the patient's level of consciousness and physiological / cardiorespiratory status by the radiology RN, with a total moderate sedation time of 10 minutes. Under real-time ultrasound guidance, the right IJ vein was accessed with a 21 gauge micropuncture needle; the needle tip within the vein was confirmed with ultrasound  image documentation. Needle exchanged over the 018 guidewire for transitional dilator, which allowed advancement of a Benson wire into the IVC. Over this, an MPA catheter was advanced. A Palindrome 23 hemodialysis catheter was tunneled from the right anterior chest wall approach to the right IJ dermatotomy site. The MPA catheter was exchanged over an Amplatz wire for serial vascular dilators which allow placement of a peel-away sheath, through which the catheter was advanced under intermittent fluoroscopy, positioned with its tips in the proximal and midright atrium. Spot chest radiograph confirms good catheter position. No pneumothorax. Catheter was flushed and primed per protocol. Catheter secured externally with O Prolene sutures. The right IJ dermatotomy site was closed with Dermabond. COMPLICATIONS: COMPLICATIONS None immediate FLUOROSCOPY TIME:  1 minutes; 9 mGy COMPARISON:  None IMPRESSION: 1. Technically successful placement of tunneled right IJ hemodialysis catheter with ultrasound and fluoroscopic guidance. Ready for routine use. ACCESS: Remains approachable for percutaneous intervention as needed. Electronically Signed   By: Lucrezia Europe M.D.   On: 02/18/2018 16:39   Ir US Guide Vasc Access Right  Result Date: 02/18/2018 CLINICAL DATA:  End-stage renal disease, needs durable venous access for hemodialysis EXAM: TUNNELED HEMODIALYSIS CATHETER PLACEMENT WITH ULTRASOUND AND FLUOROSCOPIC GUIDANCE TECHNIQUE: The procedure, risks, benefits, and alternatives were explained to the spouse. Questions regarding the procedure were encouraged and answered. The spouse understands and consents to the procedure. As antibiotic prophylaxis, cefazolin 2 g was ordered pre-procedure and administered intravenously within one hour of incision.Patency of the right IJ vein was confirmed with ultrasound with image documentation. An appropriate skin site was determined. Region was prepped using maximum barrier technique including  cap and mask, sterile gown, sterile gloves, large sterile sheet, and Chlorhexidine as cutaneous antisepsis. The region was infiltrated locally with 1% lidocaine. Intravenous Fentanyl and Versed were administered as conscious sedation during continuous monitoring of the patient's level of consciousness and physiological / cardiorespiratory status by the radiology RN, with a total moderate sedation time of 10 minutes. Under real-time ultrasound guidance, the right IJ vein was accessed with a 21 gauge micropuncture needle; the needle tip within the vein was confirmed with ultrasound image documentation. Needle exchanged over the 018 guidewire for transitional dilator, which allowed advancement of a Benson wire into the IVC. Over this, an MPA catheter was advanced. A Palindrome 23 hemodialysis catheter was tunneled from the right anterior chest wall approach to the right IJ dermatotomy site. The MPA catheter was exchanged over an Amplatz wire for serial  vascular dilators which allow placement of a peel-away sheath, through which the catheter was advanced under intermittent fluoroscopy, positioned with its tips in the proximal and midright atrium. Spot chest radiograph confirms good catheter position. No pneumothorax. Catheter was flushed and primed per protocol. Catheter secured externally with O Prolene sutures. The right IJ dermatotomy site was closed with Dermabond. COMPLICATIONS: COMPLICATIONS None immediate FLUOROSCOPY TIME:  1 minutes; 9 mGy COMPARISON:  None IMPRESSION: 1. Technically successful placement of tunneled right IJ hemodialysis catheter with ultrasound and fluoroscopic guidance. Ready for routine use. ACCESS: Remains approachable for percutaneous intervention as needed. Electronically Signed   By: Lucrezia Europe M.D.   On: 02/18/2018 16:39   Dg Chest Port 1 View  Result Date: 02/19/2018 CLINICAL DATA:  Acute on chronic CHF, COPD, respiratory arrest and cardiac arrest. End-stage renal disease, HIV.  EXAM: PORTABLE CHEST 1 VIEW COMPARISON:  Chest x-ray of February 18, 2018 FINDINGS: There has been interval placement of a dual-lumen dialysis catheter whose tip projects at the right cavoatrial junction. The lungs are well-expanded. The interstitial markings are slightly less conspicuous overall today. There is no significant pleural effusion. The cardiac silhouette is enlarged. The pulmonary vascularity is not engorged. The endotracheal tube tip lies 5.9 cm above the carina. The esophagogastric tube tip in proximal port project below the GE junction. IMPRESSION: Interval placement of a dialysis catheter via the right internal jugular approach without postprocedure complication. Stable cardiomegaly with mild improvement in interstitial edema. The support tubes are in reasonable position. Electronically Signed   By: David  [place]ique M.D.   On: 02/19/2018 07:31   Dg Chest Port 1 View  Result Date: 02/18/2018 CLINICAL DATA:  Acute respiratory failure, hypoxia, intubated patient. History of CHF, renal insufficiency, HIV, and prostate malignancy. EXAM: PORTABLE CHEST 1 VIEW COMPARISON:  Chest x-ray and chest CT scan of February 17, 2018 FINDINGS: The lungs are adequately inflated. The interstitial markings remain increased and are more conspicuous today on the left. The cardiac silhouette remains enlarged. The pulmonary vascularity is not engorged. The endotracheal tube tip lies 6.4 cm above the carina. The esophagogastric tube tip and proximal port project below the GE junction. IMPRESSION: Persistent bilateral pneumonia. Slight increased opacity noted on the left today. No overt pulmonary edema. The support tubes are in reasonable position where visualized. Electronically Signed   By: David  [place] M.D.   On: 02/18/2018 08:35   Dg Chest Portable 1 View  Result Date: 02/17/2018 CLINICAL DATA:  Shortness of breath. Intubation and orogastric tube placement. EXAM: PORTABLE CHEST 1 VIEW COMPARISON:  None. FINDINGS:  Endotracheal tube 7.4 cm from the carina. Enteric tube in place, tip visualized in the stomach. The heart is enlarged. There are bilateral ill-defined perihilar opacities. Minimal Kerley B-lines in the periphery of the right lung. No large pleural effusion or evidence of pneumothorax. Lung apices not included in the field of view. IMPRESSION: 1. Endotracheal tube 7.4 cm from the carina. Enteric tube in place with tip visualized in the stomach. 2. Cardiomegaly. Bilateral ill-defined perihilar opacities, favoring pulmonary edema. Multifocal pneumonia or aspiration could have a similar appearance. Electronically Signed   By: Jeb Levering M.D.   On: 02/17/2018 03:45    Lab Data:  CBC: Recent Labs  Lab 02/25/18 0703 02/25/18 9379 02/26/18 0848 02/27/18 0855 02/28/18 0631 03/02/18 0717  WBC 10.0  --  11.3* 8.8 8.9 18.4*  HGB 12.3* 12.6* 12.4* 10.9* 11.0* 12.5*  HCT 37.3* 37.0* 37.6* 33.9* 33.5* 38.2*  MCV 97.6  --  96.2 97.7 97.7 98.7  PLT 210  --  223 217 224 953   Basic Metabolic Panel: Recent Labs  Lab 02/25/18 0707 02/26/18 0848 02/27/18 0546 02/27/18 0855 02/28/18 0516 02/28/18 0631 03/01/18 0801 03/02/18 0717 03/03/18 0541  NA 136 138  --  140  --  139  --  139  --   K 3.7 4.2  --  4.2  --  4.1  --  4.5  --   CL  --  101  --  103  --  104  --  95*  --   CO2  --  18*  --  19*  --  18*  --  23  --   GLUCOSE 108* 114*  --  116*  --  121*  --  134*  --   BUN  --  110*  --  125*  --  104*  --  92*  --   CREATININE  --  10.89*  --  11.40*  --  10.14*  --  11.79*  --   CALCIUM  --  9.1  --  8.7*  --  8.5*  --  9.3  --   MG  --  2.6* 2.6*  --  2.5*  --  2.5* 2.5* 2.4  PHOS  --   --   --  11.8*  --  9.3*  --  11.3*  --    GFR: Estimated Creatinine Clearance: 6.3 mL/min (A) (by C-G formula based on SCr of 11.79 mg/dL (H)). Liver Function Tests: Recent Labs  Lab 02/27/18 0855 02/28/18 0631 03/02/18 0717  ALBUMIN 3.1* 3.0* 3.3*   No results for input(s): LIPASE, AMYLASE  in the last 168 hours. No results for input(s): AMMONIA in the last 168 hours. Coagulation Profile: Recent Labs  Lab 02/25/18 0703  INR 1.05   Cardiac Enzymes: No results for input(s): CKTOTAL, CKMB, CKMBINDEX, TROPONINI in the last 168 hours. BNP (last 3 results) No results for input(s): PROBNP in the last 8760 hours. HbA1C: No results for input(s): HGBA1C in the last 72 hours. CBG: Recent Labs  Lab 03/01/18 1636 03/01/18 2222 03/02/18 1228 03/02/18 1726 03/03/18 0720  GLUCAP 82 94 112* 145* 113*   Lipid Profile: Recent Labs    03/01/18 0801  TRIG 152*   Thyroid Function Tests: No results for input(s): TSH, T4TOTAL, FREET4, T3FREE, THYROIDAB in the last 72 hours. Anemia Panel: No results for input(s): VITAMINB12, FOLATE, FERRITIN, TIBC, IRON, RETICCTPCT in the last 72 hours. Urine analysis:    Component Value Date/Time   COLORURINE YELLOW 02/17/2018 0540   APPEARANCEUR CLOUDY (A) 02/17/2018 0540   LABSPEC 1.010 02/17/2018 0540   PHURINE 5.0 02/17/2018 0540   GLUCOSEU NEGATIVE 02/17/2018 0540   HGBUR LARGE (A) 02/17/2018 0540   BILIRUBINUR NEGATIVE 02/17/2018 [place] NEGATIVE 02/17/2018 0540   PROTEINUR 100 (A) 02/17/2018 0540   NITRITE NEGATIVE 02/17/2018 0540   LEUKOCYTESUR NEGATIVE 02/17/2018 0540     David [name] M.D. [facility] 03/03/2018, 7:51 AM  Pager: [phone] Between 7am to 7pm - call Pager - [phone]  After 7pm go to [email or web address] - password [email or web address]  Call night coverage person covering after 7pm

## 2018-03-03 NOTE — Progress Notes (Signed)
ANTICOAGULATION CONSULT NOTE - Initial Consult  Pharmacy Consult for heparin/warfarin Indication: atrial fibrillation  No Known Allergies  Patient Measurements: Height: 6\' 2"  (188 cm) Weight: 182 lb 15.7 oz (83 kg) IBW/kg (Calculated) : 82.2 Heparin Dosing Weight: 83 kg   Vital Signs: Temp: 98 F (36.7 C) (03/31 0742) Temp Source: Oral (03/31 0742) BP: 103/85 (03/31 0742) Pulse Rate: 137 (03/31 0742)  Labs: Recent Labs    03/02/18 0717 03/03/18 1648  HGB 12.5*  --   HCT 38.2*  --   PLT 230  --   HEPARINUNFRC  --  0.66  CREATININE 11.79*  --     Estimated Creatinine Clearance: 6.3 mL/min (A) (by C-G formula based on SCr of 11.79 mg/dL (H)).   Medical History: Past Medical History:  Diagnosis Date  . Acute on chronic systolic and diastolic heart failure, NYHA class 4 ([place])   . Arthritis    "hands, right knee, feet" (02/21/2018)  . Cardiac arrest ([place]) 02/17/2018  . CHF (congestive heart failure) ([place])   . Chronic lower back pain   . CKD (chronic kidney disease) stage V requiring chronic dialysis ([place])   . ESRD on dialysis ([place])    started 02/2018  . Gout    daily RX (02/21/2018)  . HIV (human immunodeficiency virus infection) ([place])   . Hypertension   . Pneumonia 11/2017  . Prostate cancer ([place])     Medications:  Infusions:  . heparin 1,200 Units/hr (03/03/18 0848)    Assessment: 60 YOM with PEA arrest, cath showing nonobstructive CAD, now found in A-fib and pharmacy consulted for heparin and warfarin.  No anticoagulation PTA, and H/H has been slightly low but stable with normal plts.  INR 1.05 on 3/25, and amiodarone also started.    8 hour heparin level is 0.66, therapeutic on heparin drip 1200 units/hr. No bleeding noted.    Goal of Therapy:  INR 2-3 Heparin level 0.3-0.7 units/ml Monitor platelets by anticoagulation protocol: Yes   Plan:  Continue heparin gtt at 1200 units/hr F/u level @ 22:00 to confirm  Warfarin as previously ordered today.   Monitor daily heparin level, INR, CBC, s/s bleeding  Nicole Cella, RPh Clinical Pharmacist Pager: [phone] 03/03/2018 5:16 PM

## 2018-03-03 NOTE — Progress Notes (Signed)
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  In ED patient was noted to be agonal and pulseless in parking lot. CPR started and continued for estimated 5-10 minutes before return of ROSC. Intubated. CXR consistent with pulmonary edema. Creatinine 5.32, LA 4.44. PCCM asked to admit.  He has been started on dialysis and is undergoing the CLIP process   Assessment/ Plan:    ESRD- new ESRD Undergoing CLIP process -Resume MWF regimen tomorrow. -Cath working better after tPA mid week.  - HE WILL NEED HEPARIN BOLUS BEFORE HD (but now is on a hep gtt)  - GRAFT had a much weaker bruit 3/29 but noted tp be  Thrombosed pm 3/30; d/w dr Scot Dock and appears to be related to the low BP and EF (only 15-20%). He will be catheter dependent per Dr. Scot Dock.   ANEMIA- Hb 11.9   MBD- difficulty tolerating phoslo Will stop at this point Phos in 4 range we will follow  HTN/VOL- Controlled   ACCESS-LUA AVG declot on 3/26; placed on3/25  OTHER- EF 20 %   HIV antiretrovirals    Subjective:   Very tired after dialysis yesterday but denies f/c/n/v/dyspnea.   Objective:   BP 103/85 (BP Location: Left Arm) Comment: Simultaneous filing. User may not have seen previous data.  Pulse (!) 137   Temp 98 F (36.7 C) (Oral)   Resp 18   Ht 6\' 2"  (1.88 m)   Wt 83 kg (182 lb 15.7 oz)   SpO2 97%   BMI 23.49 kg/m   Intake/Output Summary (Last 24 hours) at 03/03/2018 1125 Last data filed at 03/03/2018 1000 Gross per 24 hour  Intake 120 ml  Output 0 ml  Net 120 ml   Weight change: 0.2 kg (7.1 oz)  Physical Exam: General appearance: alert, no distress Lungs: clear to auscultation bilaterally Heart: regular rate and rhythm and no murmur Abdomen: soft, non-tender; bowel sounds normal; no masses, no  organomegaly Extremities: extremities normal, atraumatic, no cyanosis or edema Pulses: 2+ and symmetric Neurologic: Mental status: Alert, oriented, thought content appropriate Access: RIJ TC, LUA AVGthrombosed now. Left arm swollen    Imaging: No results found.  Labs: BMET Recent Labs  Lab 02/25/18 0707 02/26/18 0848 02/27/18 0855 02/28/18 0631 03/02/18 0717  NA 136 138 140 139 139  K 3.7 4.2 4.2 4.1 4.5  CL  --  101 103 104 95*  CO2  --  18* 19* 18* 23  GLUCOSE 108* 114* 116* 121* 134*  BUN  --  110* 125* 104* 92*  CREATININE  --  10.89* 11.40* 10.14* 11.79*  CALCIUM  --  9.1 8.7* 8.5* 9.3  PHOS  --   --  11.8* 9.3* 11.3*   CBC Recent Labs  Lab 02/26/18 0848 02/27/18 0855 02/28/18 0631 03/02/18 0717  WBC 11.3* 8.8 8.9 18.4*  HGB 12.4* 10.9* 11.0* 12.5*  HCT 37.6* 33.9* 33.5* 38.2*  MCV 96.2 97.7 97.7 98.7  PLT 223 217 224 230    Medications:    . amiodarone  400 mg Oral TID  . Chlorhexidine Gluconate Cloth  6 each Topical Daily  . colchicine  0.6 mg Oral Daily  . dolutegravir  50 mg Oral Daily  . emtricitabine-tenofovir AF  1 tablet Oral Daily  . feeding supplement (PRO-STAT SUGAR FREE 64)  30 mL Oral BID  . mouth rinse  15 mL Mouth Rinse BID  . midodrine  5  mg Oral BID WC  . predniSONE  40 mg Oral Q breakfast  . scopolamine  1 patch Transdermal Q72H  . senna-docusate  1 tablet Oral BID  . warfarin  5 mg Oral ONCE-1800  . Warfarin - Pharmacist Dosing Inpatient   Does not apply q1800      Otelia Santee, MD 03/03/2018, 11:25 AM

## 2018-03-04 DIAGNOSIS — N186 End stage renal disease: Secondary | ICD-10-CM | POA: Diagnosis not present

## 2018-03-04 DIAGNOSIS — B20 Human immunodeficiency virus [HIV] disease: Secondary | ICD-10-CM | POA: Diagnosis not present

## 2018-03-04 DIAGNOSIS — Z992 Dependence on renal dialysis: Secondary | ICD-10-CM | POA: Diagnosis not present

## 2018-03-04 LAB — BASIC METABOLIC PANEL
ANION GAP: 20 — AB (ref 5–15)
BUN: 81 mg/dL — ABNORMAL HIGH (ref 6–20)
CO2: 20 mmol/L — ABNORMAL LOW (ref 22–32)
Calcium: 8.7 mg/dL — ABNORMAL LOW (ref 8.9–10.3)
Chloride: 94 mmol/L — ABNORMAL LOW (ref 101–111)
Creatinine, Ser: 10.38 mg/dL — ABNORMAL HIGH (ref 0.61–1.24)
GFR calc Af Amer: 5 mL/min — ABNORMAL LOW (ref >60)
GFR, EST NON AFRICAN AMERICAN: 4 mL/min — AB (ref >60)
GLUCOSE: 118 mg/dL — AB (ref 65–99)
POTASSIUM: 5.5 mmol/L — AB (ref 3.5–5.1)
SODIUM: 134 mmol/L — AB (ref 135–145)

## 2018-03-04 LAB — PROTIME-INR
INR: 1.14
PROTHROMBIN TIME: 14.5 s (ref 11.4–15.2)

## 2018-03-04 LAB — CBC
HCT: 34.5 % — ABNORMAL LOW (ref 39.0–52.0)
HEMOGLOBIN: 11.3 g/dL — AB (ref 13.0–17.0)
MCH: 32.6 pg (ref 26.0–34.0)
MCHC: 32.8 g/dL (ref 30.0–36.0)
MCV: 99.4 fL (ref 78.0–100.0)
PLATELETS: 255 10*3/uL (ref 150–400)
RBC: 3.47 MIL/uL — AB (ref 4.22–5.81)
RDW: 13 % (ref 11.5–15.5)
WBC: 15.6 10*3/uL — AB (ref 4.0–10.5)

## 2018-03-04 LAB — MAGNESIUM: Magnesium: 2.7 mg/dL — ABNORMAL HIGH (ref 1.7–2.4)

## 2018-03-04 LAB — GLUCOSE, CAPILLARY: GLUCOSE-CAPILLARY: 106 mg/dL — AB (ref 65–99)

## 2018-03-04 LAB — HEPARIN LEVEL (UNFRACTIONATED)
HEPARIN UNFRACTIONATED: 0.48 [IU]/mL (ref 0.30–0.70)
HEPARIN UNFRACTIONATED: 0.77 [IU]/mL — AB (ref 0.30–0.70)

## 2018-03-04 MED ORDER — MIDODRINE HCL 5 MG PO TABS
ORAL_TABLET | ORAL | Status: AC
  Filled 2018-03-04: qty 1

## 2018-03-04 MED ORDER — WARFARIN SODIUM 5 MG PO TABS
5.0000 mg | ORAL_TABLET | Freq: Once | ORAL | Status: AC
  Administered 2018-03-04: 5 mg via ORAL
  Filled 2018-03-04 (×2): qty 1

## 2018-03-04 MED ORDER — SEVELAMER CARBONATE 800 MG PO TABS
1600.0000 mg | ORAL_TABLET | Freq: Three times a day (TID) | ORAL | Status: DC
  Administered 2018-03-04 – 2018-03-05 (×5): 1600 mg via ORAL
  Filled 2018-03-04 (×7): qty 2

## 2018-03-04 NOTE — Progress Notes (Signed)
[facility]                                                                              Patient Demographics  David [name], is a 77 y.o. male, DOB - [birth year], QXI:[id number]  Admit date - 02/17/2018   Admitting Physician David Hams, MD  Outpatient Primary MD for the patient is David Alar, NP  Outpatient specialists:   LOS - 15  days   Medical records reviewed and are as summarized below:    Chief Complaint  Patient presents with  . Respiratory Arrest       Brief summary   77 year old BMPMHx of HIV/AIDS, Prostate Ca, S/P Brachytherapy, CKD stage V not on dialysis, Nonischemic Cardiomyopathy with EF 03-49%ZPH Diastolic dysfunction, and HTN Presented to ED on 3/17, wife reported that patient has had a dry cough last several days however, other than that has been in usual state on health.  On the morning of admission, around 2-3 am, patient woke up with severe shortness of breath. Upon arrival to ED patient was noted to be agonal and pulseless in parking lot. CPR started and continued for estimated 5-10 minutes before return of ROSC. Intubated. CXR consistent with pulmonary edema. Creatinine 5.32, LA 4.44.  Patient was admitted by critical care service.  Creatinine 5.32.   Assessment & Plan    Principal Problem: New onset Atrial fibrillation:  -This morning patient noted to be in rapid atrial fib with heart rate 110-140's, patient was on Coreg, BP dropped down to 90s, hence Coreg held. -BP still soft, continue amiodarone as recommended by cardiology.   -Patient also started on warfarin, continue heparin drip until therapeutic INR  - will restart coreg once BP is stable.  Heart rate controlled.  Active Problems: PEA, asystole arrest ROSC in 5-10mins, Acute on chronic systolic and diastolic heart failure ([place]), nonischemic cardiomyopathy -Patient had cardiac arrest with neurological recovery.  Cardiology was consulted, patient also  underwent HD. -BNP 800, 2D echo showed EF of 15-20%, slightly down from the prior report of 20-25% -Volume management with HD -Mildly elevated troponin likely secondary to demand ischemia and chest compressions.  EKG showed left bundle branch block, unclear chronicity. -Patient underwent cardiac cath on 3/20 which showed nonobstructive CAD and recommended continue medical therapy -BP still soft, continue to hold Coreg, start once stable, was on 6.25 mg twice a day.     Acute respiratory failure with hypoxemia (HCC) with acute pulmonary edema -On arrival to ED, patient was noted to be agonal, pulseless, had PEA asystole arrest, had CPR and ACLS, ROSC 5-10 minutes.  Patient was intubated.  -Extubated on 3/19.  Currently O2 sats 99% on room air  Acute gout in feet -Improving today, elevated ESR and CRP, uric acid 5.4.   - no NSAIDs, continue colchicine and prednisone.      Chronic obstructive pulmonary disease (HCC) -Chest x-ray showed cardiomegaly and perihilar opacities concerning for pulmonary edema -Currently improving, patient has been extubated, on room air -Continue volume management with HD, flutter valve, pulmonary hygiene    New ESRD (end stage renal disease) ([place]) -Creatinine 5.32 on admission, follows  Dr. Posey Pronto in office. -Nephrology, vascular surgery following -HD per nephrology, had fistula placed on 3/25.   -Per nephrology, plan dialysis, MWF schedule, CLIP in process     HIV (human immunodeficiency virus infection) (David [name]) -Undetectable HIV RNA, CD4 430 -Continue Tivicay and Descovy  Anemia of chronic disease -H stable, on Coumadin  Prostate cancer status post brachytherapy  constipation -Continue Senokot, MiraLAX, constipation resolved  Code Status: Full CODE STATUS DVT Prophylaxis: Heparin drip Family Communication: Discussed in detail with the patient, all imaging results, lab results explained to the patient    Disposition Plan: Once stable and CLIP process  completed   Time Spent in minutes : 37mns   Procedures:   Intubated 3/17  Echocardiogram 3/18 EF 15-20%  RIJ tunneled catheter placement 3/18  Extubated 3/19  HD initiated 3/19  Heart catheterization 3/20, no culprit lesion  Consultants:   Nephrology  Cardiology  Vascular surgery  Antimicrobials:      Medications  Scheduled Meds: . amiodarone  400 mg Oral TID  . Chlorhexidine Gluconate Cloth  6 each Topical Daily  . colchicine  0.6 mg Oral Daily  . dolutegravir  50 mg Oral Daily  . emtricitabine-tenofovir AF  1 tablet Oral Daily  . feeding supplement (PRO-STAT SUGAR FREE 64)  30 mL Oral BID  . mouth rinse  15 mL Mouth Rinse BID  . midodrine      . midodrine  5 mg Oral BID WC  . predniSONE  40 mg Oral Q breakfast  . scopolamine  1 patch Transdermal Q72H  . senna-docusate  1 tablet Oral BID  . sevelamer carbonate  1,600 mg Oral TID WC  . warfarin  5 mg Oral ONCE-1800  . Warfarin - Pharmacist Dosing Inpatient   Does not apply q1800   Continuous Infusions: . heparin 1,200 Units/hr (03/03/18 2248)   PRN Meds:.oxyCODONE-acetaminophen, polyethylene glycol   Antibiotics   Anti-infectives (From admission, onward)   Start     Dose/Rate Route Frequency Ordered Stop   02/25/18 0600  ceFAZolin (ANCEF) IVPB 2g/100 mL premix     2 g 200 mL/hr over 30 Minutes Intravenous On call to O.R. 02/22/18 1100 02/25/18 0750   02/18/18 1330  ceFAZolin (ANCEF) IVPB 2g/100 mL premix     2 g 200 mL/hr over 30 Minutes Intravenous  Once 02/18/18 1212 02/18/18 1409   02/17/18 1200  dolutegravir (TIVICAY) tablet 50 mg     50 mg Oral Daily 02/17/18 0813     02/17/18 1200  emtricitabine-tenofovir AF (DESCOVY) 200-25 MG per tablet 1 tablet     1 tablet Oral Daily 02/17/18 0813          Subjective:   David Udellwas seen and examined today.  No acute issues today, feels better.  No chest pain, shortness of breath or palpitations.  Gout improving.  Patient denies dizziness,  shortness of breath, abdominal pain.     Objective:   Vitals:   03/04/18 1000 03/04/18 1030 03/04/18 1050 03/04/18 1153  BP: 100/70 101/70 116/71 110/74  Pulse: 78 76 70 73  Resp:   18 18  Temp:   98 F (36.7 C) 97.9 F (36.6 C)  TempSrc:   Oral Oral  SpO2:   100% 99%  Weight:   83.3 kg (183 lb 10.3 oz)   Height:        Intake/Output Summary (Last 24 hours) at 03/04/2018 1301 Last data filed at 03/04/2018 1050 Gross per 24 hour  Intake 845.2 ml  Output  1150 ml  Net -304.8 ml     Wt Readings from Last 3 Encounters:  03/04/18 83.3 kg (183 lb 10.3 oz)     Exam General: Alert and oriented x 3, NAD Eyes: HEENT:  Cardiovascular: S1 S2 auscultated, RRR No pedal edema b/l Respiratory: CTA B Gastrointestinal: Soft, nontender, nondistended, + bowel sounds Ext: no pedal edema bilaterally Neuro: no new deficit Musculoskeletal: No digital cyanosis, clubbing Skin: No rashes Psych: Normal affect and demeanor, alert and oriented x3       Data Reviewed:  I have personally reviewed following labs and imaging studies  Micro Results No results found for this or any previous visit (from the past 240 hour(s)).  Radiology Reports Ct Head Wo Contrast  Result Date: 02/17/2018 CLINICAL DATA:  Altered level of consciousness.  Found unresponsive. EXAM: CT HEAD WITHOUT CONTRAST TECHNIQUE: Contiguous axial images were obtained from the base of the skull through the vertex without intravenous contrast. COMPARISON:  Head CT 07/31/2017, brain MRI 11/01/2017 FINDINGS: Brain: Generalized atrophy and chronic small vessel ischemia, similar to prior exams. No intracranial hemorrhage, mass effect, or midline shift. No hydrocephalus. The basilar cisterns are patent. No evidence of territorial infarct or acute ischemia. No evidence of cerebral edema. No extra-axial or intracranial fluid collection. Vascular: No hyperdense vessel or unexpected calcification. Skull: No fracture or focal lesion.  Sinuses/Orbits: Metallic density in the right globe anterior chamber is unchanged from prior exam. Bilateral cataract resection. Minimal metallic debris in the anterior scalp. Mild mucosal thickening of the ethmoid air cells and sphenoid sinus. Mastoid air cells are clear. Other: None. IMPRESSION: 1.  No acute intracranial abnormality. 2. Generalized atrophy and chronic small vessel ischemia. 3. Metallic density in the right globe, chronic and unchanged from prior exam. Electronically Signed   By: Jeb Levering M.D.   On: 02/17/2018 05:51   Ct Chest Wo Contrast  Result Date: 02/17/2018 CLINICAL DATA:  Found unresponsive. Post CPR. Acute resp illness, > 37 years old EXAM: CT CHEST WITHOUT CONTRAST TECHNIQUE: Multidetector CT imaging of the chest was performed following the standard protocol without IV contrast. COMPARISON:  Chest radiograph earlier this day. Additional radiographs and CT in a different patient jacket, most recent CT 11/25/2017 FINDINGS: Cardiovascular: Aortic atherosclerosis. Coronary artery calcifications. Mild multi chamber cardiomegaly. Minimal pericardial fluid. Mediastinum/Nodes: Scattered mediastinal nodes not enlarged by size criteria. Lack of IV contrast limits assessment for hilar adenopathy. Endotracheal and enteric tubes appropriately positioned. Lungs/Pleura: Multifocal confluent, ground-glass, patchy and nodular opacities throughout both lungs. Consolidations in the dependent lungs appear confluent, and there is debris within the distal trachea and right mainstem bronchi, suggesting aspiration. No definite septal thickening. No pleural fluid. Upper Abdomen: Tip and side port of the enteric tube in the stomach. Bilateral renal atrophy with hyperdense lesions in both kidneys, likely cysts but incompletely characterized. Musculoskeletal: No acute finding. There are no acute or suspicious osseous abnormalities. IMPRESSION: 1. Multifocal bilateral consolidative, ground-glass, nodular  airspace opacities throughout both lungs. Minimal debris in the distal trachea and right mainstem bronchus. Overall findings suggest pneumonia with probable aspiration. Degree of pulmonary edema is also considered. Ground-glass opacities in a patient with HIV can be seen with pneumocystis pneumonia , however consolidative opacities are the prominent pattern. 2. Cardiomegaly with aortic atherosclerosis. Aortic Atherosclerosis (ICD10-I70.0). Electronically Signed   By: Jeb Levering M.D.   On: 02/17/2018 06:02   Ir Fluoro Guide Cv Line Right  Result Date: 02/18/2018 CLINICAL DATA:  End-stage renal disease, needs durable venous access for  hemodialysis EXAM: TUNNELED HEMODIALYSIS CATHETER PLACEMENT WITH ULTRASOUND AND FLUOROSCOPIC GUIDANCE TECHNIQUE: The procedure, risks, benefits, and alternatives were explained to the spouse. Questions regarding the procedure were encouraged and answered. The spouse understands and consents to the procedure. As antibiotic prophylaxis, cefazolin 2 g was ordered pre-procedure and administered intravenously within one hour of incision.Patency of the right IJ vein was confirmed with ultrasound with image documentation. An appropriate skin site was determined. Region was prepped using maximum barrier technique including cap and mask, sterile gown, sterile gloves, large sterile sheet, and Chlorhexidine as cutaneous antisepsis. The region was infiltrated locally with 1% lidocaine. Intravenous Fentanyl and Versed were administered as conscious sedation during continuous monitoring of the patient's level of consciousness and physiological / cardiorespiratory status by the radiology RN, with a total moderate sedation time of 10 minutes. Under real-time ultrasound guidance, the right IJ vein was accessed with a 21 gauge micropuncture needle; the needle tip within the vein was confirmed with ultrasound image documentation. Needle exchanged over the 018 guidewire for transitional dilator,  which allowed advancement of a Benson wire into the IVC. Over this, an MPA catheter was advanced. A Palindrome 23 hemodialysis catheter was tunneled from the right anterior chest wall approach to the right IJ dermatotomy site. The MPA catheter was exchanged over an Amplatz wire for serial vascular dilators which allow placement of a peel-away sheath, through which the catheter was advanced under intermittent fluoroscopy, positioned with its tips in the proximal and midright atrium. Spot chest radiograph confirms good catheter position. No pneumothorax. Catheter was flushed and primed per protocol. Catheter secured externally with O Prolene sutures. The right IJ dermatotomy site was closed with Dermabond. COMPLICATIONS: COMPLICATIONS None immediate FLUOROSCOPY TIME:  1 minutes; 9 mGy COMPARISON:  None IMPRESSION: 1. Technically successful placement of tunneled right IJ hemodialysis catheter with ultrasound and fluoroscopic guidance. Ready for routine use. ACCESS: Remains approachable for percutaneous intervention as needed. Electronically Signed   By: Lucrezia Europe M.D.   On: 02/18/2018 16:39   Ir US Guide Vasc Access Right  Result Date: 02/18/2018 CLINICAL DATA:  End-stage renal disease, needs durable venous access for hemodialysis EXAM: TUNNELED HEMODIALYSIS CATHETER PLACEMENT WITH ULTRASOUND AND FLUOROSCOPIC GUIDANCE TECHNIQUE: The procedure, risks, benefits, and alternatives were explained to the spouse. Questions regarding the procedure were encouraged and answered. The spouse understands and consents to the procedure. As antibiotic prophylaxis, cefazolin 2 g was ordered pre-procedure and administered intravenously within one hour of incision.Patency of the right IJ vein was confirmed with ultrasound with image documentation. An appropriate skin site was determined. Region was prepped using maximum barrier technique including cap and mask, sterile gown, sterile gloves, large sterile sheet, and Chlorhexidine as  cutaneous antisepsis. The region was infiltrated locally with 1% lidocaine. Intravenous Fentanyl and Versed were administered as conscious sedation during continuous monitoring of the patient's level of consciousness and physiological / cardiorespiratory status by the radiology RN, with a total moderate sedation time of 10 minutes. Under real-time ultrasound guidance, the right IJ vein was accessed with a 21 gauge micropuncture needle; the needle tip within the vein was confirmed with ultrasound image documentation. Needle exchanged over the 018 guidewire for transitional dilator, which allowed advancement of a Benson wire into the IVC. Over this, an MPA catheter was advanced. A Palindrome 23 hemodialysis catheter was tunneled from the right anterior chest wall approach to the right IJ dermatotomy site. The MPA catheter was exchanged over an Amplatz wire for serial vascular dilators which allow placement of a  peel-away sheath, through which the catheter was advanced under intermittent fluoroscopy, positioned with its tips in the proximal and midright atrium. Spot chest radiograph confirms good catheter position. No pneumothorax. Catheter was flushed and primed per protocol. Catheter secured externally with O Prolene sutures. The right IJ dermatotomy site was closed with Dermabond. COMPLICATIONS: COMPLICATIONS None immediate FLUOROSCOPY TIME:  1 minutes; 9 mGy COMPARISON:  None IMPRESSION: 1. Technically successful placement of tunneled right IJ hemodialysis catheter with ultrasound and fluoroscopic guidance. Ready for routine use. ACCESS: Remains approachable for percutaneous intervention as needed. Electronically Signed   By: Lucrezia Europe M.D.   On: 02/18/2018 16:39   Dg Chest Port 1 View  Result Date: 02/19/2018 CLINICAL DATA:  Acute on chronic CHF, COPD, respiratory arrest and cardiac arrest. End-stage renal disease, HIV. EXAM: PORTABLE CHEST 1 VIEW COMPARISON:  Chest x-ray of February 18, 2018 FINDINGS: There has  been interval placement of a dual-lumen dialysis catheter whose tip projects at the right cavoatrial junction. The lungs are well-expanded. The interstitial markings are slightly less conspicuous overall today. There is no significant pleural effusion. The cardiac silhouette is enlarged. The pulmonary vascularity is not engorged. The endotracheal tube tip lies 5.9 cm above the carina. The esophagogastric tube tip in proximal port project below the GE junction. IMPRESSION: Interval placement of a dialysis catheter via the right internal jugular approach without postprocedure complication. Stable cardiomegaly with mild improvement in interstitial edema. The support tubes are in reasonable position. Electronically Signed   By: David  Martinique M.D.   On: 02/19/2018 07:31   Dg Chest Port 1 View  Result Date: 02/18/2018 CLINICAL DATA:  Acute respiratory failure, hypoxia, intubated patient. History of CHF, renal insufficiency, HIV, and prostate malignancy. EXAM: PORTABLE CHEST 1 VIEW COMPARISON:  Chest x-ray and chest CT scan of February 17, 2018 FINDINGS: The lungs are adequately inflated. The interstitial markings remain increased and are more conspicuous today on the left. The cardiac silhouette remains enlarged. The pulmonary vascularity is not engorged. The endotracheal tube tip lies 6.4 cm above the carina. The esophagogastric tube tip and proximal port project below the GE junction. IMPRESSION: Persistent bilateral pneumonia. Slight increased opacity noted on the left today. No overt pulmonary edema. The support tubes are in reasonable position where visualized. Electronically Signed   By: David  Martinique M.D.   On: 02/18/2018 08:35   Dg Chest Portable 1 View  Result Date: 02/17/2018 CLINICAL DATA:  Shortness of breath. Intubation and orogastric tube placement. EXAM: PORTABLE CHEST 1 VIEW COMPARISON:  None. FINDINGS: Endotracheal tube 7.4 cm from the carina. Enteric tube in place, tip visualized in the stomach. The  heart is enlarged. There are bilateral ill-defined perihilar opacities. Minimal Kerley B-lines in the periphery of the right lung. No large pleural effusion or evidence of pneumothorax. Lung apices not included in the field of view. IMPRESSION: 1. Endotracheal tube 7.4 cm from the carina. Enteric tube in place with tip visualized in the stomach. 2. Cardiomegaly. Bilateral ill-defined perihilar opacities, favoring pulmonary edema. Multifocal pneumonia or aspiration could have a similar appearance. Electronically Signed   By: Jeb Levering M.D.   On: 02/17/2018 03:45    Lab Data:  CBC: Recent Labs  Lab 02/26/18 0848 02/27/18 0855 02/28/18 0631 03/02/18 0717 03/04/18 0021  WBC 11.3* 8.8 8.9 18.4* 15.6*  HGB 12.4* 10.9* 11.0* 12.5* 11.3*  HCT 37.6* 33.9* 33.5* 38.2* 34.5*  MCV 96.2 97.7 97.7 98.7 99.4  PLT 223 217 224 230 048   Basic Metabolic  Panel: Recent Labs  Lab 02/26/18 0848  02/27/18 0413 02/28/18 6438 02/28/18 0631 03/01/18 0801 03/02/18 0717 03/03/18 0541 03/04/18 0021  NA 138  --  140  --  139  --  139  --  134*  K 4.2  --  4.2  --  4.1  --  4.5  --  5.5*  CL 101  --  103  --  104  --  95*  --  94*  CO2 18*  --  19*  --  18*  --  23  --  20*  GLUCOSE 114*  --  116*  --  121*  --  134*  --  118*  BUN 110*  --  125*  --  104*  --  92*  --  81*  CREATININE 10.89*  --  11.40*  --  10.14*  --  11.79*  --  10.38*  CALCIUM 9.1  --  8.7*  --  8.5*  --  9.3  --  8.7*  MG 2.6*   < >  --  2.5*  --  2.5* 2.5* 2.4 2.7*  PHOS  --   --  11.8*  --  9.3*  --  11.3*  --   --    < > = values in this interval not displayed.   GFR: Estimated Creatinine Clearance: 7.1 mL/min (A) (by C-G formula based on SCr of 10.38 mg/dL (H)). Liver Function Tests: Recent Labs  Lab 02/27/18 0855 02/28/18 0631 03/02/18 0717  ALBUMIN 3.1* 3.0* 3.3*   No results for input(s): LIPASE, AMYLASE in the last 168 hours. No results for input(s): AMMONIA in the last 168 hours. Coagulation  Profile: Recent Labs  Lab 03/04/18 0021  INR 1.14   Cardiac Enzymes: No results for input(s): CKTOTAL, CKMB, CKMBINDEX, TROPONINI in the last 168 hours. BNP (last 3 results) No results for input(s): PROBNP in the last 8760 hours. HbA1C: No results for input(s): HGBA1C in the last 72 hours. CBG: Recent Labs  Lab 03/02/18 1726 03/03/18 0720 03/03/18 1149 03/03/18 1645 03/04/18 1151  GLUCAP 145* 113* 141* 218* 106*   Lipid Profile: No results for input(s): CHOL, HDL, LDLCALC, TRIG, CHOLHDL, LDLDIRECT in the last 72 hours. Thyroid Function Tests: Recent Labs    03/03/18 0827  TSH 1.576   Anemia Panel: No results for input(s): VITAMINB12, FOLATE, FERRITIN, TIBC, IRON, RETICCTPCT in the last 72 hours. Urine analysis:    Component Value Date/Time   COLORURINE YELLOW 02/17/2018 0540   APPEARANCEUR CLOUDY (A) 02/17/2018 0540   LABSPEC 1.010 02/17/2018 0540   PHURINE 5.0 02/17/2018 0540   GLUCOSEU NEGATIVE 02/17/2018 0540   HGBUR LARGE (A) 02/17/2018 0540   BILIRUBINUR NEGATIVE 02/17/2018 [place] NEGATIVE 02/17/2018 0540   PROTEINUR 100 (A) 02/17/2018 0540   NITRITE NEGATIVE 02/17/2018 0540   LEUKOCYTESUR NEGATIVE 02/17/2018 0540     Aziah Brostrom M.D. [facility] 03/04/2018, 1:01 PM  Pager: [phone] Between 7am to 7pm - call Pager - [phone]  After 7pm go to [email or web address] - password [email or web address]  Call night coverage person covering after 7pm

## 2018-03-04 NOTE — Progress Notes (Signed)
  [facility] Progress Note    Subjective:   Pt seen on HD and doing well but was hypotensive prior to starting.  Treated with midodrine 5mg  and BP improved to 108/72   Objective:   BP 98/66   Pulse 64   Temp 98 F (36.7 C)   Resp (!) 1   Ht 6\' 2"  (1.88 m)   Wt 84 kg (185 lb 3 oz)   SpO2 100%   BMI 23.78 kg/m   Intake/Output: I/O last 3 completed shifts: In: 965.2 [P.O.:720; I.V.:245.2] Out: 50 [Urine:50]   Intake/Output this shift:  No intake/output data recorded. Weight change: -5.5 kg (-12 lb 2 oz)  Physical Exam: Gen: NAD CVS: No rub Resp:CTA Abd: benign Ext: no edema, clotted LUE AVG  Labs: BMET Recent Labs  Lab 02/26/18 0848 02/27/18 0855 02/28/18 0631 03/02/18 0717 03/04/18 0021  NA 138 140 139 139 134*  K 4.2 4.2 4.1 4.5 5.5*  CL 101 103 104 95* 94*  CO2 18* 19* 18* 23 20*  GLUCOSE 114* 116* 121* 134* 118*  BUN 110* 125* 104* 92* 81*  CREATININE 10.89* 11.40* 10.14* 11.79* 10.38*  ALBUMIN  --  3.1* 3.0* 3.3*  --   CALCIUM 9.1 8.7* 8.5* 9.3 8.7*  PHOS  --  11.8* 9.3* 11.3*  --    CBC Recent Labs  Lab 02/27/18 0855 02/28/18 0631 03/02/18 0717 03/04/18 0021  WBC 8.8 8.9 18.4* 15.6*  HGB 10.9* 11.0* 12.5* 11.3*  HCT 33.9* 33.5* 38.2* 34.5*  MCV 97.7 97.7 98.7 99.4  PLT 217 224 230 255    @IMGRELPRIORS @ Medications:    . amiodarone  400 mg Oral TID  . Chlorhexidine Gluconate Cloth  6 each Topical Daily  . colchicine  0.6 mg Oral Daily  . dolutegravir  50 mg Oral Daily  . emtricitabine-tenofovir AF  1 tablet Oral Daily  . feeding supplement (PRO-STAT SUGAR FREE 64)  30 mL Oral BID  . mouth rinse  15 mL Mouth Rinse BID  . midodrine      . midodrine  5 mg Oral BID WC  . predniSONE  40 mg Oral Q breakfast  . scopolamine  1 patch Transdermal Q72H  . senna-docusate  1 tablet Oral BID  . Warfarin - Pharmacist Dosing Inpatient   Does not apply [id number]     Assessment/ Plan:   1. New onset paroxysmal A fib- on heparin and  for coumadin 2. 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  Currently on midodrine 3. ESRD BP borderline.  Would consider PD as an alternative given his poor cardiac function and clotted AVG.  Prognosis is poor given mounting end-stage disease processes.   4. 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 Nutrition: renal diet 7. Hypertension: low BP on midodrine  8. Vascular access- clotted AVG due to low cardiac output 9. Disposition- poor prognosis and is full code.  Would recommend palliative care consult to help set goals/limits of care.  I am not sure his heart will be able to tolerate outpatient HD since he can't even keep an AVG patent.  Might need to transition to PD if he remains aggressive with his care.   [name], MD [facility] Pager [phone] 03/04/2018, 8:59 AM

## 2018-03-04 NOTE — Progress Notes (Signed)
Accepted at [facility] at 11:45am Tuesday,Thursday , Saturday  2nd shift

## 2018-03-04 NOTE — Progress Notes (Signed)
Progress Note  Patient Name: David [name] Date of Encounter: 03/04/2018  Primary Cardiologist: Skeet Latch, MD   Subjective   Pt seen on dialysis; no chest pain or dyspnea  Inpatient Medications    Scheduled Meds: . amiodarone  400 mg Oral TID  . Chlorhexidine Gluconate Cloth  6 each Topical Daily  . colchicine  0.6 mg Oral Daily  . dolutegravir  50 mg Oral Daily  . emtricitabine-tenofovir AF  1 tablet Oral Daily  . feeding supplement (PRO-STAT SUGAR FREE 64)  30 mL Oral BID  . mouth rinse  15 mL Mouth Rinse BID  . midodrine      . midodrine  5 mg Oral BID WC  . predniSONE  40 mg Oral Q breakfast  . scopolamine  1 patch Transdermal Q72H  . senna-docusate  1 tablet Oral BID  . sevelamer carbonate  1,600 mg Oral TID WC  . warfarin  5 mg Oral ONCE-1800  . Warfarin - Pharmacist Dosing Inpatient   Does not apply q1800   Continuous Infusions: . heparin 1,200 Units/hr (03/03/18 2248)   PRN Meds: oxyCODONE-acetaminophen, polyethylene glycol   Vital Signs    Vitals:   03/04/18 0730 03/04/18 0830 03/04/18 0900 03/04/18 0930  BP: (!) 88/50 98/66 98/68  107/71  Pulse: 66 64 70 75  Resp:      Temp:      TempSrc:      SpO2:      Weight:      Height:        Intake/Output Summary (Last 24 hours) at 03/04/2018 1000 Last data filed at 03/04/2018 0600 Gross per 24 hour  Intake 845.2 ml  Output 50 ml  Net 795.2 ml   Filed Weights   03/02/18 2025 03/03/18 2148 03/04/18 0640  Weight: 182 lb 15.7 oz (83 kg) 171 lb 15.3 oz (78 kg) 185 lb 3 oz (84 kg)    Telemetry    Sinus - Personally Reviewed   Physical Exam   GEN: No acute distress.   Neck: No JVD Cardiac: RRR, no murmurs, rubs, or gallops.  Respiratory: Clear to auscultation bilaterally. GI: Soft, nontender, non-distended  MS: No edema Neuro:  Nonfocal    Labs    Chemistry Recent Labs  Lab 02/27/18 0855 02/28/18 0631 03/02/18 0717 03/04/18 0021  NA 140 139 139 134*  K 4.2 4.1 4.5 5.5*  CL  103 104 95* 94*  CO2 19* 18* 23 20*  GLUCOSE 116* 121* 134* 118*  BUN 125* 104* 92* 81*  CREATININE 11.40* 10.14* 11.79* 10.38*  CALCIUM 8.7* 8.5* 9.3 8.7*  ALBUMIN 3.1* 3.0* 3.3*  --   GFRNONAA 4* 4* 4* 4*  GFRAA 4* 5* 4* 5*  ANIONGAP 18* 17* 21* 20*     Hematology Recent Labs  Lab 02/28/18 0631 03/02/18 0717 03/04/18 0021  WBC 8.9 18.4* 15.6*  RBC 3.43* 3.87* 3.47*  HGB 11.0* 12.5* 11.3*  HCT 33.5* 38.2* 34.5*  MCV 97.7 98.7 99.4  MCH 32.1 32.3 32.6  MCHC 32.8 32.7 32.8  RDW 13.1 13.3 13.0  PLT 224 230 255     Patient Profile     77 y.o. 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  Echocardiogram March 18 showed ejection fraction 15-20%, mild aortic insufficiency and mild left atrial enlargement.  Cardiac catheterization February 20, 2018  showed nonobstructive coronary disease.  Patient developed atrial fibrillation and cardiology asked to evaluate.  Assessment & Plan    1 paroxysmal atrial fibrillation-patient has converted to sinus rhythm.  Continue amiodarone load.  Continue heparin/Coumadin.  Watch INR closely given potential interaction with amiodarone.  2 nonischemic cardiomyopathy-blood pressure will not allow beta-blockade or ARB.  He is presently on midodrine.  Previous PEA arrest was felt secondary to respiratory distress.  Given multiple comorbidities and poor long term prognosis I do not think he would be a candidate for ICD.  3 end-stage renal disease dialysis dependent-now managed by nephrology.  For questions or updates, please contact [place] Please consult [email or web address] for contact info under Cardiology/STEMI.      Signed, Kirk Ruths, MD  03/04/2018, 10:00 AM

## 2018-03-04 NOTE — Progress Notes (Signed)
ANTICOAGULATION CONSULT NOTE  Pharmacy Consult for heparin Indication: atrial fibrillation  No Known Allergies  Patient Measurements: Height: 6\' 2"  (188 cm) Weight: 171 lb 15.3 oz (78 kg) IBW/kg (Calculated) : 82.2 Heparin Dosing Weight: 83 kg   Vital Signs: Temp: 98.7 F (37.1 C) (03/31 2148) Temp Source: Oral (03/31 1825) BP: 124/88 (03/31 2148) Pulse Rate: 92 (03/31 2148)  Labs: Recent Labs    03/02/18 0717 03/03/18 1648 03/04/18 0021  HGB 12.5*  --  11.3*  HCT 38.2*  --  34.5*  PLT 230  --  255  LABPROT  --   --  14.5  INR  --   --  1.14  HEPARINUNFRC  --  0.66 0.48  CREATININE 11.79*  --   --     Estimated Creatinine Clearance: 6 mL/min (A) (by C-G formula based on SCr of 11.79 mg/dL (H)).  Assessment: 77 y.o. male with Afib for heparin  Goal of Therapy:  INR 2-3 Heparin level 0.3-0.7 units/ml Monitor platelets by anticoagulation protocol: Yes   Plan:  Continue Heparin at current rate   Phillis Knack, PharmD, BCPS  03/04/2018 2:12 AM

## 2018-03-04 NOTE — Progress Notes (Signed)
[place] for heparin/warfarin Indication: atrial fibrillation  No Known Allergies  Patient Measurements: Height: 6\' 2"  (188 cm) Weight: 185 lb 3 oz (84 kg) IBW/kg (Calculated) : 82.2 Heparin Dosing Weight: 83 kg   Vital Signs: Temp: 98 F (36.7 C) (04/01 0640) BP: 98/66 (04/01 0830) Pulse Rate: 64 (04/01 0830)  Labs: Recent Labs    03/02/18 0717 03/03/18 1648 03/04/18 0021  HGB 12.5*  --  11.3*  HCT 38.2*  --  34.5*  PLT 230  --  255  LABPROT  --   --  14.5  INR  --   --  1.14  HEPARINUNFRC  --  0.66 0.48  CREATININE 11.79*  --  10.38*    Estimated Creatinine Clearance: 7.1 mL/min (A) (by C-G formula based on SCr of 10.38 mg/dL (H)).   Medications:  Infusions:  . heparin 1,200 Units/hr (03/03/18 2248)    Assessment: 60 YOM with PEA arrest, cath showing nonobstructive CAD, now found in Afib. Pharmacy consulted for heparin and warfarin dosing.  No anticoagulation PTA.  Heparin level is therapeutic this morning at 0.48 on 1200 units/hr. INR is subtherapeutic at 1.14 after first dose of warfarin last night. DDI with amiodarone noted - will monitor INR closely. No bleeding noted, Hgb low stable, platelets are normal.  Goal of Therapy:  INR 2-3 Heparin level 0.3-0.7 units/ml Monitor platelets by anticoagulation protocol: Yes   Plan:  Continue heparin drip at 1200 units/hr Warfarin 5 mg PO tonight Daily heparin level, INR, CBC Monitor for s/sx of bleeding   Renold Genta, PharmD, BCPS Clinical Pharmacist Clinical phone for 03/04/2018 until 4p is [phone] After 4p, please call Main Rx at [phone] for assistance 03/04/2018 9:08 AM

## 2018-03-04 NOTE — Progress Notes (Deleted)
ANTICOAGULATION CONSULT NOTE - Initial Consult  Pharmacy Consult for heparin/warfarin Indication: atrial fibrillation  No Known Allergies  Patient Measurements: Height: 6\' 2"  (188 cm) Weight: 185 lb 3 oz (84 kg) IBW/kg (Calculated) : 82.2 Heparin Dosing Weight: 83 kg   Vital Signs: Temp: 98 F (36.7 C) (04/01 0640) BP: 98/66 (04/01 0830) Pulse Rate: 64 (04/01 0830)  Labs: Recent Labs    03/02/18 0717 03/03/18 1648 03/04/18 0021  HGB 12.5*  --  11.3*  HCT 38.2*  --  34.5*  PLT 230  --  255  LABPROT  --   --  14.5  INR  --   --  1.14  HEPARINUNFRC  --  0.66 0.48  CREATININE 11.79*  --  10.38*    Estimated Creatinine Clearance: 7.1 mL/min (A) (by C-G formula based on SCr of 10.38 mg/dL (H)).   Medical History: Past Medical History:  Diagnosis Date  . Acute on chronic systolic and diastolic heart failure, NYHA class 4 ([place])   . Arthritis    "hands, right knee, feet" (02/21/2018)  . Cardiac arrest ([place]) 02/17/2018  . CHF (congestive heart failure) ([place] [employer] [place])   . Chronic lower back pain   . CKD (chronic kidney disease) stage V requiring chronic dialysis ([place])   . ESRD on dialysis ([place])    started 02/2018  . Gout    daily RX (02/21/2018)  . HIV (human immunodeficiency virus infection) ([place])   . Hypertension   . Pneumonia 11/2017  . Prostate cancer ([place])     Medications:  Infusions:  . heparin 1,200 Units/hr (03/03/18 2248)    Assessment: 32 YOM with PEA arrest, cath showing nonobstructive CAD, now found in A-fib and pharmacy consulted for heparin and warfarin.  No anticoagulation PTA, and H/H has been slightly low but stable with normal plts.  INR 1.05 on 3/25, and amiodarone also started.  INR 1.14 today after one dose warfarin.   Goal of Therapy:  INR 2-3 Heparin level 0.3-0.7 units/ml Monitor platelets by anticoagulation protocol: Yes   Plan:  Continue heparin at current rate of 1200 units/hr Warfarin 5 mg x 1 again tonight (starting lower d/t  amiodarone) Monitor daily heparin level, INR, CBC, s/s bleeding  Joselyn Glassman, PharmD. BCPS Clinical Pharmacist Pager #: [phone] 03/04/2018 9:36 AM

## 2018-03-04 NOTE — Progress Notes (Signed)
PT Cancellation Note  Patient Details Name: David [name] MRN: [id number] DOB: 02-Feb-1941   Cancelled Treatment:    Reason Eval/Treat Not Completed: Patient at procedure or test/unavailable   Duncan Dull 03/04/2018, 7:41 AM Alben Deeds, PT DPT  Board Certified Neurologic Specialist [phone]

## 2018-03-05 LAB — RENAL FUNCTION PANEL
ALBUMIN: 3.1 g/dL — AB (ref 3.5–5.0)
Anion gap: 19 — ABNORMAL HIGH (ref 5–15)
BUN: 59 mg/dL — ABNORMAL HIGH (ref 6–20)
CO2: 22 mmol/L (ref 22–32)
Calcium: 8.7 mg/dL — ABNORMAL LOW (ref 8.9–10.3)
Chloride: 91 mmol/L — ABNORMAL LOW (ref 101–111)
Creatinine, Ser: 8.04 mg/dL — ABNORMAL HIGH (ref 0.61–1.24)
GFR, EST AFRICAN AMERICAN: 7 mL/min — AB (ref >60)
GFR, EST NON AFRICAN AMERICAN: 6 mL/min — AB (ref >60)
Glucose, Bld: 137 mg/dL — ABNORMAL HIGH (ref 65–99)
PHOSPHORUS: 8.6 mg/dL — AB (ref 2.5–4.6)
POTASSIUM: 5.1 mmol/L (ref 3.5–5.1)
Sodium: 132 mmol/L — ABNORMAL LOW (ref 135–145)

## 2018-03-05 LAB — CBC
HEMATOCRIT: 34.4 % — AB (ref 39.0–52.0)
HEMOGLOBIN: 11.5 g/dL — AB (ref 13.0–17.0)
MCH: 33 pg (ref 26.0–34.0)
MCHC: 33.4 g/dL (ref 30.0–36.0)
MCV: 98.6 fL (ref 78.0–100.0)
Platelets: 256 10*3/uL (ref 150–400)
RBC: 3.49 MIL/uL — ABNORMAL LOW (ref 4.22–5.81)
RDW: 13.2 % (ref 11.5–15.5)
WBC: 13.3 10*3/uL — ABNORMAL HIGH (ref 4.0–10.5)

## 2018-03-05 LAB — MAGNESIUM: Magnesium: 2.4 mg/dL (ref 1.7–2.4)

## 2018-03-05 LAB — HEPARIN LEVEL (UNFRACTIONATED): Heparin Unfractionated: 0.4 IU/mL (ref 0.30–0.70)

## 2018-03-05 LAB — PROTIME-INR
INR: 1.36
Prothrombin Time: 16.6 seconds — ABNORMAL HIGH (ref 11.4–15.2)

## 2018-03-05 MED ORDER — WARFARIN VIDEO
Freq: Once | Status: AC
  Administered 2018-03-05: 13:00:00

## 2018-03-05 MED ORDER — MIDODRINE HCL 5 MG PO TABS
5.0000 mg | ORAL_TABLET | Freq: Once | ORAL | Status: AC
  Administered 2018-03-05: 5 mg via ORAL

## 2018-03-05 MED ORDER — NEPRO/CARBSTEADY PO LIQD
237.0000 mL | Freq: Every day | ORAL | Status: DC
  Administered 2018-03-05 – 2018-03-07 (×3): 237 mL via ORAL
  Filled 2018-03-05 (×6): qty 237

## 2018-03-05 MED ORDER — WARFARIN SODIUM 4 MG PO TABS
4.0000 mg | ORAL_TABLET | Freq: Once | ORAL | Status: DC
  Filled 2018-03-05: qty 1

## 2018-03-05 MED ORDER — COUMADIN BOOK
Freq: Once | Status: AC
  Administered 2018-03-05: 11:00:00
  Filled 2018-03-05: qty 1

## 2018-03-05 MED ORDER — PREDNISONE 20 MG PO TABS
30.0000 mg | ORAL_TABLET | Freq: Every day | ORAL | Status: AC
  Administered 2018-03-06 – 2018-03-08 (×3): 30 mg via ORAL
  Filled 2018-03-05 (×4): qty 1

## 2018-03-05 NOTE — Progress Notes (Signed)
[facility] Progress Note    Subjective:   Feels well, no complaints   Objective:   BP 112/68 (BP Location: Right Arm)   Pulse 74   Temp 98.3 F (36.8 C) (Oral)   Resp 18   Ht 6\' 2"  (1.88 m)   Wt 83.5 kg (184 lb 1.4 oz)   SpO2 98%   BMI 23.64 kg/m   Intake/Output: I/O last 3 completed shifts: In: 758.8 [P.O.:600; I.V.:158.8] Out: 1100 [Other:1100]   Intake/Output this shift:  Total I/O In: 240 [P.O.:240] Out: -  Weight change: 5.3 kg (11 lb 11 oz)  Physical Exam: Gen: NAD CVS: no rub, IRR IRR Resp: cta Abd: benign Ext: no edema, LUE AVG clotted  Labs: BMET Recent Labs  Lab 02/27/18 0855 02/28/18 0631 03/02/18 0717 03/04/18 0021 03/05/18 0621  NA 140 139 139 134* 132*  K 4.2 4.1 4.5 5.5* 5.1  CL 103 104 95* 94* 91*  CO2 19* 18* 23 20* 22  GLUCOSE 116* 121* 134* 118* 137*  BUN 125* 104* 92* 81* 59*  CREATININE 11.40* 10.14* 11.79* 10.38* 8.04*  ALBUMIN 3.1* 3.0* 3.3*  --  3.1*  CALCIUM 8.7* 8.5* 9.3 8.7* 8.7*  PHOS 11.8* 9.3* 11.3*  --  8.6*   CBC Recent Labs  Lab 02/28/18 0631 03/02/18 0717 03/04/18 0021 03/05/18 0621  WBC 8.9 18.4* 15.6* 13.3*  HGB 11.0* 12.5* 11.3* 11.5*  HCT 33.5* 38.2* 34.5* 34.4*  MCV 97.7 98.7 99.4 98.6  PLT 224 230 255 256    @IMGRELPRIORS @ Medications:    . amiodarone  400 mg Oral TID  . Chlorhexidine Gluconate Cloth  6 each Topical Daily  . colchicine  0.6 mg Oral Daily  . dolutegravir  50 mg Oral Daily  . emtricitabine-tenofovir AF  1 tablet Oral Daily  . feeding supplement (PRO-STAT SUGAR FREE 64)  30 mL Oral BID  . mouth rinse  15 mL Mouth Rinse BID  . midodrine  5 mg Oral BID WC  . scopolamine  1 patch Transdermal Q72H  . senna-docusate  1 tablet Oral BID  . sevelamer carbonate  1,600 mg Oral TID WC  . warfarin  4 mg Oral ONCE-1800  . warfarin   Does not apply Once  . Warfarin - Pharmacist Dosing Inpatient   Does not apply [id number]     Assessment/ Plan:   1. New onset paroxysmal A  fib- on heparin and for coumadin 2. 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  Currently on midodrine 3. ESRD BP borderline.  Would consider PD as an alternative given his poor cardiac function and clotted AVG.  Prognosis is poor given mounting end-stage disease processes.  He is set up for TTS schedule at [name] 11:45.  Will plan for short session of HD tomorrow due to hyperkalemia and will get on TTS schedule. 4. 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 Nutrition: renal diet 7. Hypertension: low BP on midodrine  8. Vascular access- clotted AVG due to low cardiac output 9. Disposition- poor prognosis and is full code.  Would recommend palliative care consult to help set goals/limits of care.  I am not sure his heart will be able to tolerate outpatient HD since he can't even keep an AVG patent.  Might need to transition to PD if he remains aggressive with his care. INR not yet therapeutic.  Will change to TTS schedule and is accepted  at [facility] TTS second shift once stable for discharge.    [name], MD [place] Pager [phone] 03/05/2018, 12:28 PM

## 2018-03-05 NOTE — Discharge Instructions (Signed)

## 2018-03-05 NOTE — Progress Notes (Signed)
Nutrition Follow-up  DOCUMENTATION CODES:   Not applicable  INTERVENTION:   -Nepro Shake po daily, each supplement provides 425 kcal and 19 grams protein  -Continue Pro-Stat 30 mL BID  -Education: Reviewed dialysis diet with pt; pt already had written material from previous visit.Explained why diet restrictions are needed and provided lists of foods to limit/avoid that are high potassium, sodium, and phosphorus. Provided specific recommendations on safer alternatives of these foods. Strongly encouraged compliance of this diet. Discussed importance of protein intake at each meal and snack. Provided examples of how to maximize protein intake throughout the day. Discussed need for fluid restriction with dialysis, importance of minimizing weight gain between HD treatments, and renal-friendly beverage options.Encouraged pt to discuss specific diet questions/concerns with RD at HD outpatient facility. Teach back method used. Fair compliance expected, pt needs further education   NUTRITION DIAGNOSIS:   Inadequate oral intake related to inability to eat as evidenced by NPO status.  Improving, po intake 75-100 % of meals  GOAL:   Patient will meet greater than or equal to 90% of their needs  Progressing  MONITOR:   PO intake, Supplement acceptance, Labs, Weight trends  REASON FOR ASSESSMENT:   Consult Enteral/tube feeding initiation and management  ASSESSMENT:   Patient with PMH significant for immunosuppression HIV, prostate cancer, CHF, CKD V (not currently on HD), and HTN. Presents this admission with shortness of breath and coded upon arrival to emergency room. Admitted for cardiogenic pulmonary edema and PEA arrest.   Outpatient HD as been established. Noted per Nephrology, pt may not tolerate outpatient HD well, noted consideration of PD as alternative and palliative care consult for goals of care.   Pt reports appetite is good, eating 75-100% of meals.  Hyperphosphatemia  persists, Renvela started yesterday. Reviewed dialysis diet today including food sources high in phosphorus to limit/avoid. Pt is on a renal diet  Labs: sodium 132, potassium wdl, phosphorus 8.6 Meds: revenla  Diet Order:  Diet renal/carb modified with fluid restriction Diet-HS Snack? Nothing; Fluid restriction: 1200 mL Fluid; Room service appropriate? Yes; Fluid consistency: Thin  EDUCATION NEEDS:   Education needs have been addressed  Skin:  Skin Assessment: Reviewed RN Assessment  Last BM:  3/31  Height:   Ht Readings from Last 1 Encounters:  02/17/18 6\' 2"  (1.88 m)    Weight:   Wt Readings from Last 1 Encounters:  03/04/18 184 lb 1.4 oz (83.5 kg)    Ideal Body Weight:  86.4 kg  BMI:  Body mass index is 23.64 kg/m.  Estimated Nutritional Needs:   Kcal:  2100-2400 kcals  Protein:  105-125 g  Fluid:  1000 ml + UOP   [employer] MS, RD, LDN, CNSC [phone] Pager  [phone] Weekend/On-Call Pager

## 2018-03-05 NOTE — Progress Notes (Signed)
[facility]                                                                              Patient Demographics  David [name], is a 77 y.o. male, DOB - 1941-05-05, UKG:[id number]  Admit date - 02/17/2018   Admitting Physician Kandice Hams, MD  Outpatient Primary MD for the patient is [name], NP  Outpatient specialists:   LOS - 16  days   Medical records reviewed and are as summarized below:    Chief Complaint  Patient presents with  . 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  On the morning of admission, around 2-3 am, patient woke up with severe shortness of breath. Upon arrival to ED patient was noted to be agonal and pulseless in parking lot. CPR started and continued for estimated 5-10 minutes before return of ROSC. Intubated. CXR consistent with pulmonary edema. Creatinine 5.32, LA 4.44.  Patient was admitted by critical care service.  Creatinine 5.32.   Assessment & Plan    Principal Problem: New onset Atrial fibrillation:  -This morning patient noted to be in rapid atrial fib with heart rate 110-140's, patient was on Coreg, BP dropped down to 90s, hence Coreg held. -Continue amiodarone 400 mg TID as recommended by cardiology -Patient also started on warfarin, continue heparin drip until therapeutic INR  -BP still soft, Coreg was placed on hold we will restart once BP stable   Active Problems: PEA, asystole arrest ROSC in 5-10mins, Acute on chronic systolic and diastolic heart failure ([place]), nonischemic cardiomyopathy -Patient had cardiac arrest with neurological recovery.  Cardiology was consulted, patient also  underwent HD. -BNP 800, 2D echo showed EF of 15-20%, slightly down from the prior report of 20-25% -Mildly elevated troponin likely secondary to demand ischemia and chest compressions.  EKG showed left bundle branch block, unclear chronicity. -Patient underwent cardiac cath on 3/20 which showed nonobstructive CAD and recommended continue medical therapy -BP still soft, continue to hold Coreg (was on 6.25 mg twice a day)    New ESRD on hemodialysis ([place]) -Creatinine 5.32 on admission, follows Dr. Posey Pronto in office.  AV fistula placed on 3/25.  Volume management with HD -Per nephrology, poor prognosis, recommended palliative care consult for goals of care patient has been tolerating HD.  However has poor cardiac function and clotted AVG, may not be able to tolerate outpatient HD -Palliative consulted.  Otherwise clipped to start on TTS schedule.  - Per renal short session of HD-on 4/3 due to hyperkalemia and will get on TTS schedule   Acute respiratory failure with hypoxemia (HCC) with acute pulmonary edema -On arrival to ED, patient was noted to be agonal, pulseless, had PEA asystole arrest, had CPR and ACLS, ROSC 5-10 minutes.  Patient was intubated.  -Extubated on 3/19.  Currently O2  sats 99% on room air  Acute gout in feet -Improving, elevated ESR and CRP, uric acid 5.4.   - no NSAIDs, continue colchicine and prednisone.  Taper to prednisone 30 mg from 4/3.    Chronic obstructive pulmonary disease (HCC) -Chest x-ray showed cardiomegaly and perihilar opacities concerning for pulmonary edema -Currently improving, patient has been extubated, on room air -Continue volume management with HD, flutter valve, pulmonary hygiene    HIV (human immunodeficiency virus infection) ([place]) -Undetectable HIV RNA, CD4 430 -Continue Tivicay and Descovy  Anemia of chronic disease -H stable, on Coumadin  Prostate cancer status post brachytherapy  constipation -Continue Senokot, MiraLAX, constipation  resolved  Code Status: Full CODE STATUS DVT Prophylaxis: Heparin drip Family Communication: Discussed in detail with the patient, all imaging results, lab results explained to the patient    Disposition Plan: Pending palliative consult, hemodialysis in a.m., possible DC on 4/3 or 4/4  Time Spent in minutes : 80mns   Procedures:   Intubated 3/17  Echocardiogram 3/18 EF 15-20%  RIJ tunneled catheter placement 3/18  Extubated 3/19  HD initiated 3/19  Heart catheterization 3/20, no culprit lesion  Consultants:   Nephrology  Cardiology  Vascular surgery  Antimicrobials:      Medications  Scheduled Meds: . amiodarone  400 mg Oral TID  . Chlorhexidine Gluconate Cloth  6 each Topical Daily  . colchicine  0.6 mg Oral Daily  . dolutegravir  50 mg Oral Daily  . emtricitabine-tenofovir AF  1 tablet Oral Daily  . feeding supplement (PRO-STAT SUGAR FREE 64)  30 mL Oral BID  . mouth rinse  15 mL Mouth Rinse BID  . midodrine  5 mg Oral BID WC  . scopolamine  1 patch Transdermal Q72H  . senna-docusate  1 tablet Oral BID  . sevelamer carbonate  1,600 mg Oral TID WC  . warfarin  4 mg Oral ONCE-1800  . Warfarin - Pharmacist Dosing Inpatient   Does not apply q1800   Continuous Infusions: . heparin 1,200 Units/hr (03/04/18 2231)   PRN Meds:.oxyCODONE-acetaminophen, polyethylene glycol   Antibiotics   Anti-infectives (From admission, onward)   Start     Dose/Rate Route Frequency Ordered Stop   02/25/18 0600  ceFAZolin (ANCEF) IVPB 2g/100 mL premix     2 g 200 mL/hr over 30 Minutes Intravenous On call to O.R. 02/22/18 1100 02/25/18 0750   02/18/18 1330  ceFAZolin (ANCEF) IVPB 2g/100 mL premix     2 g 200 mL/hr over 30 Minutes Intravenous  Once 02/18/18 1212 02/18/18 1409   02/17/18 1200  dolutegravir (TIVICAY) tablet 50 mg     50 mg Oral Daily 02/17/18 0813     02/17/18 1200  emtricitabine-tenofovir AF (DESCOVY) 200-25 MG per tablet 1 tablet     1 tablet Oral Daily  02/17/18 0813          Subjective:   David [name] seen and examined today.  No complaints, heart rate controlled, gout improving and feet.  No chest pain or shortness of breath.   Patient denies dizziness, abdominal pain, nausea or vomiting.     Objective:   Vitals:   03/04/18 1758 03/04/18 2122 03/05/18 0512 03/05/18 0844  BP: 91/61 100/64 104/66 112/68  Pulse: 75 71 70 74  Resp: _0 Temp: 97.9 F (36.6 C) 98.5 F (36.9 C) 98.6 F (37 C) 98.3 F (36.8 C)  TempSrc: Oral Oral Oral Oral  SpO2: 100% 98% 99% 98%  Weight:  83.5 kg (  184 lb 1.4 oz)    Height:        Intake/Output Summary (Last 24 hours) at 03/05/2018 1404 Last data filed at 03/05/2018 1328 Gross per 24 hour  Intake 960 ml  Output 0 ml  Net 960 ml     Wt Readings from Last 3 Encounters:  03/04/18 83.5 kg (184 lb 1.4 oz)     Exam   General: Alert and oriented x 3, NAD  Eyes:   HEENT:    Cardiovascular: S1 S2 clear, RRR   respiratory: CTA B  Gastrointestinal: Soft, NT, ND, NBS  Ext: no pedal edema bilaterally  Neuro: no neuro deficits  Musculoskeletal: No digital cyanosis, clubbing  Skin: No rashes  Psych: alert and oriented x3, normal affect     Data Reviewed:  I have personally reviewed following labs and imaging studies  Micro Results No results found for this or any previous visit (from the past 240 hour(s)).  Radiology Reports Ct Head Wo Contrast  Result Date: 02/17/2018 CLINICAL DATA:  Altered level of consciousness.  Found unresponsive. EXAM: CT HEAD WITHOUT CONTRAST TECHNIQUE: Contiguous axial images were obtained from the base of the skull through the vertex without intravenous contrast. COMPARISON:  Head CT 07/31/2017, brain MRI 11/01/2017 FINDINGS: Brain: Generalized atrophy and chronic small vessel ischemia, similar to prior exams. No intracranial hemorrhage, mass effect, or midline shift. No hydrocephalus. The basilar cisterns are patent. No evidence of  territorial infarct or acute ischemia. No evidence of cerebral edema. No extra-axial or intracranial fluid collection. Vascular: No hyperdense vessel or unexpected calcification. Skull: No fracture or focal lesion. Sinuses/Orbits: Metallic density in the right globe anterior chamber is unchanged from prior exam. Bilateral cataract resection. Minimal metallic debris in the anterior scalp. Mild mucosal thickening of the ethmoid air cells and sphenoid sinus. Mastoid air cells are clear. Other: None. IMPRESSION: 1.  No acute intracranial abnormality. 2. Generalized atrophy and chronic small vessel ischemia. 3. Metallic density in the right globe, chronic and unchanged from prior exam. Electronically Signed   By: Jeb Levering M.D.   On: 02/17/2018 05:51   Ct Chest Wo Contrast  Result Date: 02/17/2018 CLINICAL DATA:  Found unresponsive. Post CPR. Acute resp illness, > 38 years old EXAM: CT CHEST WITHOUT CONTRAST TECHNIQUE: Multidetector CT imaging of the chest was performed following the standard protocol without IV contrast. COMPARISON:  Chest radiograph earlier this day. Additional radiographs and CT in a different patient jacket, most recent CT 11/25/2017 FINDINGS: Cardiovascular: Aortic atherosclerosis. Coronary artery calcifications. Mild multi chamber cardiomegaly. Minimal pericardial fluid. Mediastinum/Nodes: Scattered mediastinal nodes not enlarged by size criteria. Lack of IV contrast limits assessment for hilar adenopathy. Endotracheal and enteric tubes appropriately positioned. Lungs/Pleura: Multifocal confluent, ground-glass, patchy and nodular opacities throughout both lungs. Consolidations in the dependent lungs appear confluent, and there is debris within the distal trachea and right mainstem bronchi, suggesting aspiration. No definite septal thickening. No pleural fluid. Upper Abdomen: Tip and side port of the enteric tube in the stomach. Bilateral renal atrophy with hyperdense lesions in both  kidneys, likely cysts but incompletely characterized. Musculoskeletal: No acute finding. There are no acute or suspicious osseous abnormalities. IMPRESSION: 1. Multifocal bilateral consolidative, ground-glass, nodular airspace opacities throughout both lungs. Minimal debris in the distal trachea and right mainstem bronchus. Overall findings suggest pneumonia with probable aspiration. Degree of pulmonary edema is also considered. Ground-glass opacities in a patient with HIV can be seen with pneumocystis pneumonia , however consolidative opacities are the prominent pattern. 2.  Cardiomegaly with aortic atherosclerosis. Aortic Atherosclerosis (ICD10-I70.0). Electronically Signed   By: Jeb Levering M.D.   On: 02/17/2018 06:02   Ir Fluoro Guide Cv Line Right  Result Date: 02/18/2018 CLINICAL DATA:  End-stage renal disease, needs durable venous access for hemodialysis EXAM: TUNNELED HEMODIALYSIS CATHETER PLACEMENT WITH ULTRASOUND AND FLUOROSCOPIC GUIDANCE TECHNIQUE: The procedure, risks, benefits, and alternatives were explained to the spouse. Questions regarding the procedure were encouraged and answered. The spouse understands and consents to the procedure. As antibiotic prophylaxis, cefazolin 2 g was ordered pre-procedure and administered intravenously within one hour of incision.Patency of the right IJ vein was confirmed with ultrasound with image documentation. An appropriate skin site was determined. Region was prepped using maximum barrier technique including cap and mask, sterile gown, sterile gloves, large sterile sheet, and Chlorhexidine as cutaneous antisepsis. The region was infiltrated locally with 1% lidocaine. Intravenous Fentanyl and Versed were administered as conscious sedation during continuous monitoring of the patient's level of consciousness and physiological / cardiorespiratory status by the radiology RN, with a total moderate sedation time of 10 minutes. Under real-time ultrasound guidance,  the right IJ vein was accessed with a 21 gauge micropuncture needle; the needle tip within the vein was confirmed with ultrasound image documentation. Needle exchanged over the 018 guidewire for transitional dilator, which allowed advancement of a Benson wire into the IVC. Over this, an MPA catheter was advanced. A Palindrome 23 hemodialysis catheter was tunneled from the right anterior chest wall approach to the right IJ dermatotomy site. The MPA catheter was exchanged over an Amplatz wire for serial vascular dilators which allow placement of a peel-away sheath, through which the catheter was advanced under intermittent fluoroscopy, positioned with its tips in the proximal and midright atrium. Spot chest radiograph confirms good catheter position. No pneumothorax. Catheter was flushed and primed per protocol. Catheter secured externally with O Prolene sutures. The right IJ dermatotomy site was closed with Dermabond. COMPLICATIONS: COMPLICATIONS None immediate FLUOROSCOPY TIME:  1 minutes; 9 mGy COMPARISON:  None IMPRESSION: 1. Technically successful placement of tunneled right IJ hemodialysis catheter with ultrasound and fluoroscopic guidance. Ready for routine use. ACCESS: Remains approachable for percutaneous intervention as needed. Electronically Signed   By: Lucrezia Europe M.D.   On: 02/18/2018 16:39   Ir US Guide Vasc Access Right  Result Date: 02/18/2018 CLINICAL DATA:  End-stage renal disease, needs durable venous access for hemodialysis EXAM: TUNNELED HEMODIALYSIS CATHETER PLACEMENT WITH ULTRASOUND AND FLUOROSCOPIC GUIDANCE TECHNIQUE: The procedure, risks, benefits, and alternatives were explained to the spouse. Questions regarding the procedure were encouraged and answered. The spouse understands and consents to the procedure. As antibiotic prophylaxis, cefazolin 2 g was ordered pre-procedure and administered intravenously within one hour of incision.Patency of the right IJ vein was confirmed with  ultrasound with image documentation. An appropriate skin site was determined. Region was prepped using maximum barrier technique including cap and mask, sterile gown, sterile gloves, large sterile sheet, and Chlorhexidine as cutaneous antisepsis. The region was infiltrated locally with 1% lidocaine. Intravenous Fentanyl and Versed were administered as conscious sedation during continuous monitoring of the patient's level of consciousness and physiological / cardiorespiratory status by the radiology RN, with a total moderate sedation time of 10 minutes. Under real-time ultrasound guidance, the right IJ vein was accessed with a 21 gauge micropuncture needle; the needle tip within the vein was confirmed with ultrasound image documentation. Needle exchanged over the 018 guidewire for transitional dilator, which allowed advancement of a Benson wire into the IVC. Over  this, an MPA catheter was advanced. A Palindrome 23 hemodialysis catheter was tunneled from the right anterior chest wall approach to the right IJ dermatotomy site. The MPA catheter was exchanged over an Amplatz wire for serial vascular dilators which allow placement of a peel-away sheath, through which the catheter was advanced under intermittent fluoroscopy, positioned with its tips in the proximal and midright atrium. Spot chest radiograph confirms good catheter position. No pneumothorax. Catheter was flushed and primed per protocol. Catheter secured externally with O Prolene sutures. The right IJ dermatotomy site was closed with Dermabond. COMPLICATIONS: COMPLICATIONS None immediate FLUOROSCOPY TIME:  1 minutes; 9 mGy COMPARISON:  None IMPRESSION: 1. Technically successful placement of tunneled right IJ hemodialysis catheter with ultrasound and fluoroscopic guidance. Ready for routine use. ACCESS: Remains approachable for percutaneous intervention as needed. Electronically Signed   By: Lucrezia Europe M.D.   On: 02/18/2018 16:39   Dg Chest Port 1  View  Result Date: 02/19/2018 CLINICAL DATA:  Acute on chronic CHF, COPD, respiratory arrest and cardiac arrest. End-stage renal disease, HIV. EXAM: PORTABLE CHEST 1 VIEW COMPARISON:  Chest x-ray of February 18, 2018 FINDINGS: There has been interval placement of a dual-lumen dialysis catheter whose tip projects at the right cavoatrial junction. The lungs are well-expanded. The interstitial markings are slightly less conspicuous overall today. There is no significant pleural effusion. The cardiac silhouette is enlarged. The pulmonary vascularity is not engorged. The endotracheal tube tip lies 5.9 cm above the carina. The esophagogastric tube tip in proximal port project below the GE junction. IMPRESSION: Interval placement of a dialysis catheter via the right internal jugular approach without postprocedure complication. Stable cardiomegaly with mild improvement in interstitial edema. The support tubes are in reasonable position. Electronically Signed   By: David  Martinique M.D.   On: 02/19/2018 07:31   Dg Chest Port 1 View  Result Date: 02/18/2018 CLINICAL DATA:  Acute respiratory failure, hypoxia, intubated patient. History of CHF, renal insufficiency, HIV, and prostate malignancy. EXAM: PORTABLE CHEST 1 VIEW COMPARISON:  Chest x-ray and chest CT scan of February 17, 2018 FINDINGS: The lungs are adequately inflated. The interstitial markings remain increased and are more conspicuous today on the left. The cardiac silhouette remains enlarged. The pulmonary vascularity is not engorged. The endotracheal tube tip lies 6.4 cm above the carina. The esophagogastric tube tip and proximal port project below the GE junction. IMPRESSION: Persistent bilateral pneumonia. Slight increased opacity noted on the left today. No overt pulmonary edema. The support tubes are in reasonable position where visualized. Electronically Signed   By: David  Martinique M.D.   On: 02/18/2018 08:35   Dg Chest Portable 1 View  Result Date:  02/17/2018 CLINICAL DATA:  Shortness of breath. Intubation and orogastric tube placement. EXAM: PORTABLE CHEST 1 VIEW COMPARISON:  None. FINDINGS: Endotracheal tube 7.4 cm from the carina. Enteric tube in place, tip visualized in the stomach. The heart is enlarged. There are bilateral ill-defined perihilar opacities. Minimal Kerley B-lines in the periphery of the right lung. No large pleural effusion or evidence of pneumothorax. Lung apices not included in the field of view. IMPRESSION: 1. Endotracheal tube 7.4 cm from the carina. Enteric tube in place with tip visualized in the stomach. 2. Cardiomegaly. Bilateral ill-defined perihilar opacities, favoring pulmonary edema. Multifocal pneumonia or aspiration could have a similar appearance. Electronically Signed   By: Jeb Levering M.D.   On: 02/17/2018 03:45    Lab Data:  CBC: Recent Labs  Lab 02/27/18 0855 02/28/18 0631 03/02/18  3244 03/04/18 0021 03/05/18 0621  WBC 8.8 8.9 18.4* 15.6* 13.3*  HGB 10.9* 11.0* 12.5* 11.3* 11.5*  HCT 33.9* 33.5* 38.2* 34.5* 34.4*  MCV 97.7 97.7 98.7 99.4 98.6  PLT 217 224 230 255 010   Basic Metabolic Panel: Recent Labs  Lab 02/27/18 0855  02/28/18 0631 03/01/18 0801 03/02/18 0717 03/03/18 0541 03/04/18 0021 03/05/18 0621  NA 140  --  139  --  139  --  134* 132*  K 4.2  --  4.1  --  4.5  --  5.5* 5.1  CL 103  --  104  --  95*  --  94* 91*  CO2 19*  --  18*  --  23  --  20* 22  GLUCOSE 116*  --  121*  --  134*  --  118* 137*  BUN 125*  --  104*  --  92*  --  81* 59*  CREATININE 11.40*  --  10.14*  --  11.79*  --  10.38* 8.04*  CALCIUM 8.7*  --  8.5*  --  9.3  --  8.7* 8.7*  MG  --    < >  --  2.5* 2.5* 2.4 2.7* 2.4  PHOS 11.8*  --  9.3*  --  11.3*  --   --  8.6*   < > = values in this interval not displayed.   GFR: Estimated Creatinine Clearance: 9.2 mL/min (A) (by C-G formula based on SCr of 8.04 mg/dL (H)). Liver Function Tests: Recent Labs  Lab 02/27/18 0855 02/28/18 0631  03/02/18 0717 03/05/18 0621  ALBUMIN 3.1* 3.0* 3.3* 3.1*   No results for input(s): LIPASE, AMYLASE in the last 168 hours. No results for input(s): AMMONIA in the last 168 hours. Coagulation Profile: Recent Labs  Lab 03/04/18 0021 03/05/18 0621  INR 1.14 1.36   Cardiac Enzymes: No results for input(s): CKTOTAL, CKMB, CKMBINDEX, TROPONINI in the last 168 hours. BNP (last 3 results) No results for input(s): PROBNP in the last 8760 hours. HbA1C: No results for input(s): HGBA1C in the last 72 hours. CBG: Recent Labs  Lab 03/02/18 1726 03/03/18 0720 03/03/18 1149 03/03/18 1645 03/04/18 1151  GLUCAP 145* 113* 141* 218* 106*   Lipid Profile: No results for input(s): CHOL, HDL, LDLCALC, TRIG, CHOLHDL, LDLDIRECT in the last 72 hours. Thyroid Function Tests: Recent Labs    03/03/18 0827  TSH 1.576   Anemia Panel: No results for input(s): VITAMINB12, FOLATE, FERRITIN, TIBC, IRON, RETICCTPCT in the last 72 hours. Urine analysis:    Component Value Date/Time   COLORURINE YELLOW 02/17/2018 0540   APPEARANCEUR CLOUDY (A) 02/17/2018 0540   LABSPEC 1.010 02/17/2018 0540   PHURINE 5.0 02/17/2018 0540   GLUCOSEU NEGATIVE 02/17/2018 0540   HGBUR LARGE (A) 02/17/2018 0540   BILIRUBINUR NEGATIVE 02/17/2018 [place] NEGATIVE 02/17/2018 0540   PROTEINUR 100 (A) 02/17/2018 0540   NITRITE NEGATIVE 02/17/2018 0540   LEUKOCYTESUR NEGATIVE 02/17/2018 0540     David [name] M.D. [facility] 03/05/2018, 2:04 PM  Pager: [phone] Between 7am to 7pm - call Pager - [phone]  After 7pm go to [email or web address] - password [email or web address]  Call night coverage person covering after 7pm

## 2018-03-05 NOTE — Progress Notes (Signed)
Update Bing Neighbors can start Thursday 1st treatment at 11:45am Chair time 12:00pm

## 2018-03-05 NOTE — Progress Notes (Signed)
[place] for heparin/warfarin Indication: atrial fibrillation  No Known Allergies  Patient Measurements: Height: 6\' 2"  (188 cm) Weight: 184 lb 1.4 oz (83.5 kg) IBW/kg (Calculated) : 82.2 Heparin Dosing Weight: 83 kg   Vital Signs: Temp: 98.3 F (36.8 C) (04/02 0844) Temp Source: Oral (04/02 0844) BP: 112/68 (04/02 0844) Pulse Rate: 74 (04/02 0844)  Labs: Recent Labs    03/04/18 0021 03/04/18 1203 03/05/18 0621  HGB 11.3*  --  11.5*  HCT 34.5*  --  34.4*  PLT 255  --  256  LABPROT 14.5  --  16.6*  INR 1.14  --  1.36  HEPARINUNFRC 0.48 0.77* 0.40  CREATININE 10.38*  --  8.04*    Estimated Creatinine Clearance: 9.2 mL/min (A) (by C-G formula based on SCr of 8.04 mg/dL (H)).   Medications:  Infusions:  . heparin 1,200 Units/hr (03/04/18 2231)    Assessment: 75 YOM with PEA arrest, cath showing nonobstructive CAD, now found in Afib. Pharmacy consulted for heparin and warfarin dosing.  No anticoagulation PTA.  Heparin level is therapeutic this morning at 0.4 on 1200 units/hr. INR is subtherapeutic at 1.36 after two doses of warfarin. DDI with amiodarone noted - will monitor INR closely. No bleeding noted, Hgb low stable, platelets are normal.  Goal of Therapy:  INR 2-3 Heparin level 0.3-0.7 units/ml Monitor platelets by anticoagulation protocol: Yes   Plan:  Continue heparin drip at 1200 units/hr Warfarin 4 mg PO tonight Daily heparin level, INR, CBC Monitor for s/sx of bleeding   Renold Genta, PharmD, BCPS Clinical Pharmacist Clinical phone for 03/05/2018 until 4p is [phone] After 4p, please call Main Rx at [phone] for assistance 03/05/2018 10:00 AM

## 2018-03-05 NOTE — Progress Notes (Signed)
Progress Note  Patient Name: David [name] Date of Encounter: 03/05/2018  Primary Cardiologist: Skeet Latch, MD   Subjective   No CP or dyspnea  Inpatient Medications    Scheduled Meds: . amiodarone  400 mg Oral TID  . Chlorhexidine Gluconate Cloth  6 each Topical Daily  . colchicine  0.6 mg Oral Daily  . dolutegravir  50 mg Oral Daily  . emtricitabine-tenofovir AF  1 tablet Oral Daily  . feeding supplement (PRO-STAT SUGAR FREE 64)  30 mL Oral BID  . mouth rinse  15 mL Mouth Rinse BID  . midodrine  5 mg Oral BID WC  . scopolamine  1 patch Transdermal Q72H  . senna-docusate  1 tablet Oral BID  . sevelamer carbonate  1,600 mg Oral TID WC  . warfarin  4 mg Oral ONCE-1800  . warfarin   Does not apply Once  . Warfarin - Pharmacist Dosing Inpatient   Does not apply q1800   Continuous Infusions: . heparin 1,200 Units/hr (03/04/18 2231)   PRN Meds: oxyCODONE-acetaminophen, polyethylene glycol   Vital Signs    Vitals:   03/04/18 1758 03/04/18 2122 03/05/18 0512 03/05/18 0844  BP: 91/61 100/64 104/66 112/68  Pulse: 75 71 70 74  Resp: 19 18 18 18   Temp: 97.9 F (36.6 C) 98.5 F (36.9 C) 98.6 F (37 C) 98.3 F (36.8 C)  TempSrc: Oral Oral Oral Oral  SpO2: 100% 98% 99% 98%  Weight:  184 lb 1.4 oz (83.5 kg)    Height:        Intake/Output Summary (Last 24 hours) at 03/05/2018 1101 Last data filed at 03/05/2018 0927 Gross per 24 hour  Intake 720 ml  Output 0 ml  Net 720 ml   Filed Weights   03/04/18 0640 03/04/18 1050 03/04/18 2122  Weight: 185 lb 3 oz (84 kg) 183 lb 10.3 oz (83.3 kg) 184 lb 1.4 oz (83.5 kg)    Telemetry    Sinus with NSVT- Personally Reviewed   Physical Exam   GEN: WD/WN No acute distress.   Neck: No JVD, supple Cardiac: RRR Respiratory: Clear to auscultation bilaterally; no wheeze. GI: Soft, nontender, non-distended, no masses MS: No edema Neuro:  Grossly intact   Labs    Chemistry Recent Labs  Lab 02/28/18 0631  03/02/18 0717 03/04/18 0021 03/05/18 0621  NA 139 139 134* 132*  K 4.1 4.5 5.5* 5.1  CL 104 95* 94* 91*  CO2 18* 23 20* 22  GLUCOSE 121* 134* 118* 137*  BUN 104* 92* 81* 59*  CREATININE 10.14* 11.79* 10.38* 8.04*  CALCIUM 8.5* 9.3 8.7* 8.7*  ALBUMIN 3.0* 3.3*  --  3.1*  GFRNONAA 4* 4* 4* 6*  GFRAA 5* 4* 5* 7*  ANIONGAP 17* 21* 20* 19*     Hematology Recent Labs  Lab 03/02/18 0717 03/04/18 0021 03/05/18 0621  WBC 18.4* 15.6* 13.3*  RBC 3.87* 3.47* 3.49*  HGB 12.5* 11.3* 11.5*  HCT 38.2* 34.5* 34.4*  MCV 98.7 99.4 98.6  MCH 32.3 32.6 33.0  MCHC 32.7 32.8 33.4  RDW 13.3 13.0 13.2  PLT 230 255 256     Patient Profile     77 y.o. 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  Echocardiogram March 18 showed ejection fraction 15-20%, mild aortic insufficiency and mild left atrial enlargement.  Cardiac  catheterization February 20, 2018 showed nonobstructive coronary disease.  Patient developed atrial fibrillation and cardiology asked to evaluate.  Assessment & Plan    1 paroxysmal atrial fibrillation-patient remains in sinus rhythm.  Continue amiodarone load.  Continue heparin/Coumadin.  Watch INR closely given potential interaction with amiodarone. Will change amiodarone to 200 mg daily at DC.  2 nonischemic cardiomyopathy-blood pressure will not allow beta-blockade or ARB.  He is presently on midodrine.  Previous PEA arrest was felt secondary to respiratory distress.  Given multiple comorbidities and poor long term prognosis I do not think he would be a candidate for ICD.  3 end-stage renal disease dialysis dependent-now managed by nephrology.  4 NSVT-continue amiodarone; BP will not allow beta blocker; given multiple comorbidities, not a candidate for ICD.  For questions or  updates, please contact [place] Please consult [email or web address] for contact info under Cardiology/STEMI.      Signed, Kirk Ruths, MD  03/05/2018, 11:01 AM

## 2018-03-05 NOTE — Progress Notes (Signed)
Physical Therapy Treatment Patient Details Name: David [name] MRN: [id number] DOB: 1941/01/07 Today's Date: 03/05/2018    History of Present Illness Pt is a 77 y.o. male admitted 02/17/18 with severe SOB and PEA arrest upon arrival requiring CPR before return to [place]; intubated 3/17-3/19. CXR consistent with pulmonary edema. CXR 3/18 shows bilateral pnuemonia. R IJ tunneled HD catheter placed 3/18 to initiate HD. 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     PT Comments    Pt progressing well towards functional mobility goals. Pt demonstrates bed mobility mod I, transfers supervision, and ambulation min guard with SPC. Performed stair negotiation today with min guard and max cues for safe management/technique with SPC. Noted pt with decreased cognition requiring mod to max cues to follow commands this session and getting distracted easily. Current plan remains appropriate. Will continue to follow acutely and progress as tolerated.    Follow Up Recommendations  No PT follow up;Supervision/Assistance - 24 hour     Equipment Recommendations  None recommended by PT    Recommendations for Other Services       Precautions / Restrictions Precautions Precautions: Fall Precaution Comments: R IJ tunnelled cath Restrictions Weight Bearing Restrictions: No    Mobility  Bed Mobility Overal bed mobility: Modified Independent             General bed mobility comments: increased time  Transfers Overall transfer level: Needs assistance Equipment used: None Transfers: Sit to/from Stand Sit to Stand: Supervision         General transfer comment: increased time required  Ambulation/Gait Ambulation/Gait assistance: Min guard Ambulation Distance (Feet): 250 Feet Assistive device: Straight cane Gait Pattern/deviations: Step-through pattern;Decreased stride  length;Antalgic;Trunk flexed Gait velocity: decreased, able to significantly increased with VCs   General Gait Details: supervision for safety. no LOB noted. VCs for upright posture, sequencing with SPC, and increased speed   Stairs Stairs: Yes   Stair Management: One rail Right;One rail Left;Step to pattern;With cane;Forwards Number of Stairs: 4 General stair comments: min guard for safety and stability. VCs and demonstration for safe management of SPC on stairs  Wheelchair Mobility    Modified Rankin (Stroke Patients Only)       Balance Overall balance assessment: Needs assistance Sitting-balance support: No upper extremity supported;Feet supported Sitting balance-Leahy Scale: Good     Standing balance support: During functional activity;Single extremity supported Standing balance-Leahy Scale: Poor Standing balance comment: reliant on UE for support                            Cognition Arousal/Alertness: Awake/alert Behavior During Therapy: WFL for tasks assessed/performed Overall Cognitive Status: Impaired/Different from baseline Area of Impairment: Following commands;Safety/judgement;Awareness;Problem solving;Attention                   Current Attention Level: Sustained;Selective   Following Commands: Follows one step commands with increased time;Follows one step commands inconsistently;Follows multi-step commands inconsistently;Follows multi-step commands with increased time Safety/Judgement: Decreased awareness of safety;Decreased awareness of deficits Awareness: Emergent Problem Solving: Slow processing;Requires verbal cues General Comments: slight delay in processing. requiring mod to max cues to stay on task and follow commands. easily distractible. pt perseverating on cost of hospital stay and going home      Exercises      General Comments General comments (skin integrity, edema, etc.): pt with hoarse voice (RN aware)  Pertinent  Vitals/Pain Pain Assessment: Faces Faces Pain Scale: Hurts little more Pain Location: bil LUEs Pain Descriptors / Indicators: Sore;Aching Pain Intervention(s): Monitored during session;Limited activity within patient's tolerance;Repositioned    Home Living                      Prior Function            PT Goals (current goals can now be found in the care plan section) Acute Rehab PT Goals Patient Stated Goal: Return home PT Goal Formulation: With patient Time For Goal Achievement: 03/06/18 Potential to Achieve Goals: Good Progress towards PT goals: Progressing toward goals    Frequency    Min 3X/week      PT Plan Current plan remains appropriate    Co-evaluation              AM-PAC PT "6 Clicks" Daily Activity  Outcome Measure  Difficulty turning over in bed (including adjusting bedclothes, sheets and blankets)?: None Difficulty moving from lying on back to sitting on the side of the bed? : None Difficulty sitting down on and standing up from a chair with arms (e.g., wheelchair, bedside commode, etc,.)?: A Little Help needed moving to and from a bed to chair (including a wheelchair)?: None Help needed walking in hospital room?: A Little Help needed climbing 3-5 steps with a railing? : A Little 6 Click Score: 21    End of Session Equipment Utilized During Treatment: Gait belt Activity Tolerance: Patient tolerated treatment well Patient left: with call bell/phone within reach;with bed alarm set;in bed Nurse Communication: Mobility status PT Visit Diagnosis: Other abnormalities of gait and mobility (R26.89)     Time: 0488-8916 PT Time Calculation (min) (ACUTE ONLY): 18 min  Charges:  $Gait Training: 8-22 mins                    G Codes:       Vic Ripper, SPT   Vic Ripper 03/05/2018, 4:11 PM

## 2018-03-06 LAB — BASIC METABOLIC PANEL WITH GFR
Anion gap: 19 — ABNORMAL HIGH (ref 5–15)
BUN: 89 mg/dL — ABNORMAL HIGH (ref 6–20)
CO2: 21 mmol/L — ABNORMAL LOW (ref 22–32)
Calcium: 8.7 mg/dL — ABNORMAL LOW (ref 8.9–10.3)
Chloride: 91 mmol/L — ABNORMAL LOW (ref 101–111)
Creatinine, Ser: 9.82 mg/dL — ABNORMAL HIGH (ref 0.61–1.24)
GFR calc Af Amer: 5 mL/min — ABNORMAL LOW
GFR calc non Af Amer: 5 mL/min — ABNORMAL LOW
Glucose, Bld: 108 mg/dL — ABNORMAL HIGH (ref 65–99)
Potassium: 5 mmol/L (ref 3.5–5.1)
Sodium: 131 mmol/L — ABNORMAL LOW (ref 135–145)

## 2018-03-06 LAB — CBC
HCT: 33.3 % — ABNORMAL LOW (ref 39.0–52.0)
Hemoglobin: 10.6 g/dL — ABNORMAL LOW (ref 13.0–17.0)
MCH: 31.1 pg (ref 26.0–34.0)
MCHC: 31.8 g/dL (ref 30.0–36.0)
MCV: 97.7 fL (ref 78.0–100.0)
Platelets: 291 10*3/uL (ref 150–400)
RBC: 3.41 MIL/uL — ABNORMAL LOW (ref 4.22–5.81)
RDW: 12.7 % (ref 11.5–15.5)
WBC: 15.3 10*3/uL — ABNORMAL HIGH (ref 4.0–10.5)

## 2018-03-06 LAB — PROTIME-INR
INR: 1.59
Prothrombin Time: 18.9 seconds — ABNORMAL HIGH (ref 11.4–15.2)

## 2018-03-06 LAB — HEPARIN LEVEL (UNFRACTIONATED): Heparin Unfractionated: 0.53 IU/mL (ref 0.30–0.70)

## 2018-03-06 LAB — MAGNESIUM: Magnesium: 2.5 mg/dL — ABNORMAL HIGH (ref 1.7–2.4)

## 2018-03-06 MED ORDER — AMIODARONE HCL 200 MG PO TABS
200.0000 mg | ORAL_TABLET | Freq: Every day | ORAL | Status: DC
  Administered 2018-03-07 – 2018-03-08 (×2): 200 mg via ORAL
  Filled 2018-03-06 (×3): qty 1

## 2018-03-06 MED ORDER — WARFARIN SODIUM 5 MG PO TABS
5.0000 mg | ORAL_TABLET | Freq: Once | ORAL | Status: AC
  Administered 2018-03-06: 5 mg via ORAL
  Filled 2018-03-06: qty 1

## 2018-03-06 MED ORDER — WARFARIN SODIUM 4 MG PO TABS: 4.0000 mg | ORAL_TABLET | Freq: Once | ORAL | Status: DC

## 2018-03-06 MED ORDER — WARFARIN SODIUM 5 MG PO TABS: 5.0000 mg | ORAL_TABLET | Freq: Once | ORAL | Status: DC

## 2018-03-06 MED ORDER — HEPARIN SODIUM (PORCINE) 1000 UNIT/ML DIALYSIS: 20.0000 [IU]/kg | INTRAMUSCULAR | Status: DC | PRN

## 2018-03-06 MED ORDER — MIDODRINE HCL 5 MG PO TABS
ORAL_TABLET | ORAL | Status: AC
  Filled 2018-03-06: qty 1

## 2018-03-06 NOTE — Progress Notes (Signed)
PROGRESS NOTE  David [name] CBS:[id number] DOB: August 22, 1941 DOA: 02/17/2018 PCP: [name], NP  HPI/Recap of past 25 hours: 77 year old male with PMHx of HIV/AIDS, Prostate Ca, S/P Brachytherapy, CKD stage V not on HD PTA, Nonischemic Cardiomyopathy with EF 84-66%ZLD Diastolic dysfunction, and HTN, presented to ED on 3/17, wife reported that patient has had a dry cough last several days however, other than that has been in usual state on health.  On the morning of admission, around 2-3 am, patient woke up with severe shortness of breath. Upon arrival to ED patient was noted to be agonal and pulseless in parking lot. CPR started and continued for estimated 5-10 minutes before return of ROSC. Intubated. CXR consistent with pulmonary edema. Creatinine 5.32, LA 4.44.  Patient was admitted by critical care service. TRH assumed care.  Today, patient reported feeling better, denies any new complaints, denies any worsening shortness of breath, chest pain, abdominal pain, nausea/vomiting, fever/chills.   Assessment/Plan: Principal Problem:   HIV (human immunodeficiency virus infection) ([place]) Active Problems:   Respiratory arrest ([place])   Acute pulmonary edema (HCC)   Acute on chronic systolic heart failure (HCC)   Chronic obstructive pulmonary disease (HCC)   Cardiac arrest (HCC)   Acute on chronic systolic and diastolic heart failure, NYHA class 4 (HCC)   ESRD (end stage renal disease) ([place])   Acute respiratory failure with hypoxemia ([place])  New ESRD on hemodialysis Creatinine 5.32 on admission, follows Dr. Posey Pronto in office.  AV fistula placed on 3/25. Volume management with HD Per nephrology, poor prognosis, recommended palliative care consult for goals of care. Patient has been tolerating HD, however has poor cardiac function and clotted AVG, may not be able to tolerate outpatient HD Clipped to start on TTS schedule as outpt Per renal short session of HD-on 4/3 due to  hyperkalemia and will get on TTS schedule Palliative consult placed  PEA, asystole arrest ROSC in 5-10mins, Acute on chronic systolic and diastolic HF, nonischemic cardiomyopathy Patient had cardiac arrest with neurological recovery PCCM/Cardiology was consulted BNP 800, 2D echo showed EF of 15-20%, slightly down from the prior report of 20-25% Mildly elevated troponin likely secondary to demand ischemia and chest compressions.  EKG showed left bundle branch block, unclear chronicity. Patient underwent cardiac cath on 3/20 which showed nonobstructive CAD and recommended continue medical therapy BP still soft, continue to hold Coreg (was on 6.25 mg twice a day) , ACE.  Not a candidate for ICD per cardiology  New onset Atrial fibrillation Currently rate controlled with sinus rhythm Cardiology on board: continue amiodarone 200 mg daily Patient also started on warfarin, continue heparin drip until therapeutic INR  BP still soft, Coreg was placed on hold we will restart once BP stable   Acute respiratory failure with hypoxemia 2/2 acute pulmonary edema Resolved, on room air saturating well On arrival to ED, patient was noted to be agonal, pulseless, had PEA asystole arrest, had CPR and ACLS, ROSC 5-10 minutes.  Patient was intubated.  Extubated on 3/19  Acute gout in feet Improving, elevated ESR and CRP, uric acid 5.4.   Continue colchicine and prednisone  Chronic obstructive pulmonary disease Stable Chest x-ray showed cardiomegaly and perihilar opacities concerning for pulmonary edema  HIV Undetectable HIV RNA, CD4 430 Continue Tivicay and Descovy  Anemia of chronic disease HGB stable  Prostate cancer status post brachytherapy     Code Status: Full  Family Communication: Wife at bedside  Disposition Plan: Once palliative has seen  patient   Consultants:  Cardiology  PCCM  Nephrology  Procedures:  Intubated 3/17  Echocardiogram 3/18 EF 15-20%  RIJ tunneled  catheter placement 3/18  Extubated 3/19  HD initiated 3/19  Heart catheterization 3/20, no culprit lesion  Antimicrobials:  None  DVT prophylaxis: Heparin   Objective: Vitals:   03/06/18 0930 03/06/18 1000 03/06/18 1021 03/06/18 1720  BP: 102/79 97/68 110/66 99/70  Pulse: 68 70 68 75  Resp:   18 18  Temp:   (!) 97.5 F (36.4 C) 98.5 F (36.9 C)  TempSrc:   Oral Oral  SpO2:   99% 98%  Weight:   81.1 kg (178 lb 12.7 oz)   Height:        Intake/Output Summary (Last 24 hours) at 03/06/2018 1827 Last data filed at 03/06/2018 1300 Gross per 24 hour  Intake 240 ml  Output 2000 ml  Net -1760 ml   Filed Weights   03/04/18 2122 03/06/18 0715 03/06/18 1021  Weight: 83.5 kg (184 lb 1.4 oz) 83.3 kg (183 lb 10.3 oz) 81.1 kg (178 lb 12.7 oz)    Exam:   General: NAD  Cardiovascular: S1, S2 present  Respiratory: Chest clear to auscultation bilaterally  Abdomen: Soft, nontender, nondistended, bowel sounds present  Musculoskeletal: No pedal edema bilaterally  Skin: Normal  Psychiatry: Normal mood   Data Reviewed: CBC: Recent Labs  Lab 02/28/18 0631 03/02/18 0717 03/04/18 0021 03/05/18 0621 03/06/18 0500  WBC 8.9 18.4* 15.6* 13.3* 15.3*  HGB 11.0* 12.5* 11.3* 11.5* 10.6*  HCT 33.5* 38.2* 34.5* 34.4* 33.3*  MCV 97.7 98.7 99.4 98.6 97.7  PLT 224 230 255 256 650   Basic Metabolic Panel: Recent Labs  Lab 02/28/18 0631  03/02/18 0717 03/03/18 0541 03/04/18 0021 03/05/18 0621 03/06/18 0500  NA 139  --  139  --  134* 132* 131*  K 4.1  --  4.5  --  5.5* 5.1 5.0  CL 104  --  95*  --  94* 91* 91*  CO2 18*  --  23  --  20* 22 21*  GLUCOSE 121*  --  134*  --  118* 137* 108*  BUN 104*  --  92*  --  81* 59* 89*  CREATININE 10.14*  --  11.79*  --  10.38* 8.04* 9.82*  CALCIUM 8.5*  --  9.3  --  8.7* 8.7* 8.7*  MG  --    < > 2.5* 2.4 2.7* 2.4 2.5*  PHOS 9.3*  --  11.3*  --   --  8.6*  --    < > = values in this interval not displayed.   GFR: Estimated  Creatinine Clearance: 7.5 mL/min (A) (by C-G formula based on SCr of 9.82 mg/dL (H)). Liver Function Tests: Recent Labs  Lab 02/28/18 0631 03/02/18 0717 03/05/18 0621  ALBUMIN 3.0* 3.3* 3.1*   No results for input(s): LIPASE, AMYLASE in the last 168 hours. No results for input(s): AMMONIA in the last 168 hours. Coagulation Profile: Recent Labs  Lab 03/04/18 0021 03/05/18 0621 03/06/18 0500  INR 1.14 1.36 1.59   Cardiac Enzymes: No results for input(s): CKTOTAL, CKMB, CKMBINDEX, TROPONINI in the last 168 hours. BNP (last 3 results) No results for input(s): PROBNP in the last 8760 hours. HbA1C: No results for input(s): HGBA1C in the last 72 hours. CBG: Recent Labs  Lab 03/02/18 1726 03/03/18 0720 03/03/18 1149 03/03/18 1645 03/04/18 1151  GLUCAP 145* 113* 141* 218* 106*   Lipid Profile: No results for  input(s): CHOL, HDL, LDLCALC, TRIG, CHOLHDL, LDLDIRECT in the last 72 hours. Thyroid Function Tests: No results for input(s): TSH, T4TOTAL, FREET4, T3FREE, THYROIDAB in the last 72 hours. Anemia Panel: No results for input(s): VITAMINB12, FOLATE, FERRITIN, TIBC, IRON, RETICCTPCT in the last 72 hours. Urine analysis:    Component Value Date/Time   COLORURINE YELLOW 02/17/2018 0540   APPEARANCEUR CLOUDY (A) 02/17/2018 0540   LABSPEC 1.010 02/17/2018 0540   PHURINE 5.0 02/17/2018 0540   GLUCOSEU NEGATIVE 02/17/2018 0540   HGBUR LARGE (A) 02/17/2018 0540   BILIRUBINUR NEGATIVE 02/17/2018 0540   KETONESUR NEGATIVE 02/17/2018 0540   PROTEINUR 100 (A) 02/17/2018 0540   NITRITE NEGATIVE 02/17/2018 0540   LEUKOCYTESUR NEGATIVE 02/17/2018 0540   Sepsis Labs: _0 (procalcitonin:4,lacticidven:4)  )No results found for this or any previous visit (from the past 240 hour(s)).    Studies: No results found.  Scheduled Meds: . [START ON 03/07/2018] amiodarone  200 mg Oral Daily  . Chlorhexidine Gluconate Cloth  6 each Topical Daily  . colchicine  0.6 mg Oral Daily  .  dolutegravir  50 mg Oral Daily  . emtricitabine-tenofovir AF  1 tablet Oral Daily  . feeding supplement (NEPRO CARB STEADY)  237 mL Oral Q1400  . feeding supplement (PRO-STAT SUGAR FREE 64)  30 mL Oral BID  . mouth rinse  15 mL Mouth Rinse BID  . midodrine  5 mg Oral BID WC  . predniSONE  30 mg Oral Q breakfast  . scopolamine  1 patch Transdermal Q72H  . senna-docusate  1 tablet Oral BID  . sevelamer carbonate  1,600 mg Oral TID WC  . Warfarin - Pharmacist Dosing Inpatient   Does not apply q1800    Continuous Infusions: . heparin 1,200 Units/hr (03/05/18 2340)     LOS: 17 days     Alma Friendly, MD [facility]   If 7PM-7AM, please contact night-coverage [email or web address] Password [facility] 03/06/2018, 6:27 PM

## 2018-03-06 NOTE — Progress Notes (Signed)
I was present at this dialysis session. I have reviewed the session itself and made appropriate changes.   Filed Weights   03/04/18 1050 03/04/18 2122 03/06/18 0715  Weight: 83.3 kg (183 lb 10.3 oz) 83.5 kg (184 lb 1.4 oz) 83.3 kg (183 lb 10.3 oz)    Recent Labs  Lab 03/05/18 0621 03/06/18 0500  NA 132* 131*  K 5.1 5.0  CL 91* 91*  CO2 22 21*  GLUCOSE 137* 108*  BUN 59* 89*  CREATININE 8.04* 9.82*  CALCIUM 8.7* 8.7*  PHOS 8.6*  --     Recent Labs  Lab 03/04/18 0021 03/05/18 0621 03/06/18 0500  WBC 15.6* 13.3* 15.3*  HGB 11.3* 11.5* 10.6*  HCT 34.5* 34.4* 33.3*  MCV 99.4 98.6 97.7  PLT 255 256 291    Scheduled Meds: . amiodarone  400 mg Oral TID  . Chlorhexidine Gluconate Cloth  6 each Topical Daily  . colchicine  0.6 mg Oral Daily  . dolutegravir  50 mg Oral Daily  . emtricitabine-tenofovir AF  1 tablet Oral Daily  . feeding supplement (NEPRO CARB STEADY)  237 mL Oral Q1400  . feeding supplement (PRO-STAT SUGAR FREE 64)  30 mL Oral BID  . mouth rinse  15 mL Mouth Rinse BID  . midodrine  5 mg Oral BID WC  . predniSONE  30 mg Oral Q breakfast  . scopolamine  1 patch Transdermal Q72H  . senna-docusate  1 tablet Oral BID  . sevelamer carbonate  1,600 mg Oral TID WC  . warfarin  4 mg Oral ONCE-1800  . Warfarin - Pharmacist Dosing Inpatient   Does not apply q1800   Continuous Infusions: . heparin 1,200 Units/hr (03/05/18 2340)   PRN Meds:.heparin, oxyCODONE-acetaminophen, polyethylene glycol    Assessment and Plan: 1. New onset paroxysmal A fib- on heparin and for coumadin 2. 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 Currently on midodrine 3. ESRDBP borderline. Would consider PD as an alternative given his poor cardiac function and clotted AVG. Prognosis is poor given mounting end-stage disease processes.  He is set up for TTS schedule at [facility] 11:45.  Will plan for short session of HD today due to  hyperkalemia and will get on TTS schedule. 4. 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 Nutrition:renal diet 7. Hypertension:low BP on midodrine  8. Vascular access- clotted AVG due to low cardiac output 9. Disposition- poor prognosis and is full code. Would recommend palliative care consult to help set goals/limits of care. I am not sure his heart will be able to tolerate outpatient HD since he can't even keep an AVG patent. Might need to transition to PD if he remains aggressive with his care. INR not yet therapeutic.  Will change to TTS schedule and is accepted at [facility] TTS second shift once stable for discharge.     [name],  MD 03/06/2018, 9:04 AM

## 2018-03-06 NOTE — Progress Notes (Addendum)
Progress Note  Patient Name: David [name] Date of Encounter: 03/06/2018  Primary Cardiologist: [name], MD   Subjective   Seen in HD. Without new complaints this morning. Denies SOB, CP, or palpitations. Asking when he will be ready for discharge.   Inpatient Medications    Scheduled Meds: . amiodarone  400 mg Oral TID  . Chlorhexidine Gluconate Cloth  6 each Topical Daily  . colchicine  0.6 mg Oral Daily  . dolutegravir  50 mg Oral Daily  . emtricitabine-tenofovir AF  1 tablet Oral Daily  . feeding supplement (NEPRO CARB STEADY)  237 mL Oral Q1400  . feeding supplement (PRO-STAT SUGAR FREE 64)  30 mL Oral BID  . mouth rinse  15 mL Mouth Rinse BID  . midodrine  5 mg Oral BID WC  . predniSONE  30 mg Oral Q breakfast  . scopolamine  1 patch Transdermal Q72H  . senna-docusate  1 tablet Oral BID  . sevelamer carbonate  1,600 mg Oral TID WC  . warfarin  4 mg Oral ONCE-1800  . Warfarin - Pharmacist Dosing Inpatient   Does not apply q1800   Continuous Infusions: . heparin 1,200 Units/hr (03/05/18 2340)   PRN Meds: heparin, oxyCODONE-acetaminophen, polyethylene glycol   Vital Signs    Vitals:   03/06/18 0830 03/06/18 0845 03/06/18 0900 03/06/18 0930  BP: 102/60 100/69 100/66 102/79  Pulse: 61 64 61 68  Resp:      Temp:      TempSrc:      SpO2:      Weight:      Height:        Intake/Output Summary (Last 24 hours) at 03/06/2018 0954 Last data filed at 03/06/2018 0442 Gross per 24 hour  Intake 480 ml  Output 0 ml  Net 480 ml   Filed Weights   03/04/18 1050 03/04/18 2122 03/06/18 0715  Weight: 183 lb 10.3 oz (83.3 kg) 184 lb 1.4 oz (83.5 kg) 183 lb 10.3 oz (83.3 kg)    Telemetry    NSR with brief episode of NSVT yesterday evening - Personally Reviewed  Physical Exam   GEN: Laying in bed in no acute distress.   Neck: No JVD, no carotid bruits Cardiac: RRR, no murmurs, rubs, or gallops.  Respiratory: Clear to auscultation bilaterally, no  wheezes/ rales/ rhonchi GI: NABS, Soft, nontender, non-distended  MS: No edema; No deformity. Neuro:  Nonfocal, moving all extremities spontaneously Psych: Normal affect   Labs    Chemistry Recent Labs  Lab 02/28/18 0631 03/02/18 0717 03/04/18 0021 03/05/18 0621 03/06/18 0500  NA 139 139 134* 132* 131*  K 4.1 4.5 5.5* 5.1 5.0  CL 104 95* 94* 91* 91*  CO2 18* 23 20* 22 21*  GLUCOSE 121* 134* 118* 137* 108*  BUN 104* 92* 81* 59* 89*  CREATININE 10.14* 11.79* 10.38* 8.04* 9.82*  CALCIUM 8.5* 9.3 8.7* 8.7* 8.7*  ALBUMIN 3.0* 3.3*  --  3.1*  --   GFRNONAA 4* 4* 4* 6* 5*  GFRAA 5* 4* 5* 7* 5*  ANIONGAP 17* 21* 20* 19* 19*     Hematology Recent Labs  Lab 03/04/18 0021 03/05/18 0621 03/06/18 0500  WBC 15.6* 13.3* 15.3*  RBC 3.47* 3.49* 3.41*  HGB 11.3* 11.5* 10.6*  HCT 34.5* 34.4* 33.3*  MCV 99.4 98.6 97.7  MCH 32.6 33.0 31.1  MCHC 32.8 33.4 31.8  RDW 13.0 13.2 12.7  PLT 255 256 291    Cardiac EnzymesNo results for input(s):  TROPONINI in the last 168 hours. No results for input(s): TROPIPOC in the last 168 hours.   BNPNo results for input(s): BNP, PROBNP in the last 168 hours.   DDimer No results for input(s): DDIMER in the last 168 hours.   Radiology    No results found.  Cardiac Studies   Echocardiogram 02/18/18: Study Conclusions  - Left ventricle: The cavity size was moderately dilated. There was   moderate concentric hypertrophy. Systolic function was normal.   The estimated ejection fraction was in the range of 15% to 20%.   Doppler parameters are consistent with abnormal left ventricular   relaxation (grade 1 diastolic dysfunction). - Ventricular septum: Septal motion showed paradox. - Aortic valve: There was mild regurgitation. - Left atrium: The atrium was mildly dilated.  Impressions:  - No prior study available for comparison. LV is moderately dilated   with severely decreased LVEF estimated at 15-20%. There is   diffuse hypokinesis with  akinesis at the inferior and   inferolateral walls. RV is poorly visualized but appears to have   normal size and systolic function.  Left Heart Catheterization 02/20/18: Conclusion     Ost 2nd Mrg to 2nd Mrg lesion is 50% stenosed.  Mid LAD lesion is 25% stenosed.  LV end diastolic pressure is normal. LVEDP 6 mm Hg.  There is no aortic valve stenosis.   Nonobstructive CAD.  Continue medical therapy.      Patient Profile     77 y.o. 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  Echocardiogram March 18 showed ejection fraction 15-20%, mild aortic insufficiency and mild left atrial enlargement.  Cardiac catheterization February 20, 2018 showed nonobstructive coronary disease.  Patient developed atrial fibrillation and cardiology asked to evaluate.  Assessment & Plan    1. Paroxysmal atrial fibrillation: patient maintaining sinus rhythm on amiodarone. On heparin to coumadin bridge - INR 1.59 today. - Continue amiodarone 400mg  TID - plan to transition to 200mg  daily at discharge - Continue heparin to coumadin bridge  2. NICM: BP soft limiting ability to add BBlocker, ACEi/ARB. Currently on midodrine. Previous PEA arrest thought to be 2/2 respiratory distress. Given multiple comorbidities and poor prognosis, patient felt not to be a candidate for ICD  3. ESRD on HD: seen in dialysis this AM; new this admission.  - Continue management per Nephrology  4. NSVT: brief episode of NSVT yesterday evening. On amiodarone. BP will not allow addition of BBlocker. Given multiple comorbidities and poor prognosis, patient felt not to be a candidate for ICD  Palliative Care consulted 03/05/18 given poor prognosis and multiple comorbidities.    For questions or updates, please contact [place] Please consult [email or web address] for contact info under Cardiology/STEMI.      Signed, Abigail Butts, PA-C  03/06/2018, 9:54 AM   [phone]  As above; pt seen and examined; no chest pain or dyspnea; remains in sinus; change amiodarone to 200 mg daily; continue coumadin with goal INR of 2-3. Please have pt fu in coumadin clinic following DC and with Dr Sallyanne Kuster 8 weeks following DC; we will sign off; please call with questions. Kirk Ruths

## 2018-03-06 NOTE — Progress Notes (Signed)
[place] for heparin/warfarin Indication: atrial fibrillation  No Known Allergies  Patient Measurements: Height: 6\' 2"  (188 cm) Weight: 178 lb 12.7 oz (81.1 kg) IBW/kg (Calculated) : 82.2 Heparin Dosing Weight: 83 kg   Vital Signs: Temp: 97.5 F (36.4 C) (04/03 1021) Temp Source: Oral (04/03 1021) BP: 110/66 (04/03 1021) Pulse Rate: 68 (04/03 1021)  Labs: Recent Labs    03/04/18 0021 03/04/18 1203 03/05/18 0621 03/06/18 0500  HGB 11.3*  --  11.5* 10.6*  HCT 34.5*  --  34.4* 33.3*  PLT 255  --  256 291  LABPROT 14.5  --  16.6* 18.9*  INR 1.14  --  1.36 1.59  HEPARINUNFRC 0.48 0.77* 0.40 0.53  CREATININE 10.38*  --  8.04* 9.82*    Estimated Creatinine Clearance: 7.5 mL/min (A) (by C-G formula based on SCr of 9.82 mg/dL (H)).   Medications:  Infusions:  . heparin 1,200 Units/hr (03/05/18 2340)    Assessment: 64 YOM with PEA arrest, cath showing nonobstructive CAD,  found in Afib. Pharmacy consulted on 03/03/18 for heparin and warfarin dosing.  No anticoagulation PTA.  Heparin level is therapeutic this morning at 0.53 on 1200 units/hr. INR is subtherapeutic at 1.59 but is beginning to increase after  three doses of warfarin.  Hgb low/stable but down some today to 10.6 and pltc remains wnl. No bleeding noted.  DDI with amiodarone noted, loading dose 400mg  TID (3/31>>) - will monitor INR closely. NOTE:  Yesterday's coumadin dose was not given due to patient vomited dose per Epic charting MAR comment. Cardiologist notes the patient is patient maintaining sinus rhythm on amiodarone; plan to continue heparin to coumadin bridge    Goal of Therapy:  INR 2-3 Heparin level 0.3-0.7 units/ml Monitor platelets by anticoagulation protocol: Yes   Plan:  Continue heparin drip at 1200 units/hr Warfarin 5 mg PO tonight Daily heparin level, INR, CBC Monitor for s/sx of bleeding  Nicole Cella, RPh Clinical Pharmacist Clinical phone for  03/06/2018 until 4p is [phone] After 4p, please call Main Rx at [phone] for assistance 03/06/2018 11:00 AM

## 2018-03-06 NOTE — Progress Notes (Signed)
Physical Therapy Treatment Patient Details Name: David [name] MRN: [id number] DOB: 26-Jan-1941 Today's Date: 03/06/2018    History of Present Illness Pt is a 77 y.o. male admitted 02/17/18 with severe SOB and PEA arrest upon arrival requiring CPR before return to [place]; intubated 3/17-3/19. CXR consistent with pulmonary edema. CXR 3/18 shows bilateral pnuemonia. R IJ tunneled HD catheter placed 3/18 to initiate HD. 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     PT Comments    Pt progressing towards functional mobility goals. Pt demonstrated transfers at a supervision level and ambulation and stair negotiation min guard with SPC. Pt able to perform unsupported static standing for ~30 sec x3 for pericare and hand hygiene. Pt appears to have improved cognition compared to yesterday, but is still mildly confused and demonstrates slow processing, decreased command following, and decreased safety and judgement. Plan to reinforce technique for stair negotiation next session. Will continue to follow acutely and progress as tolerated.     Follow Up Recommendations  No PT follow up;Supervision/Assistance - 24 hour     Equipment Recommendations  None recommended by PT    Recommendations for Other Services       Precautions / Restrictions Precautions Precautions: Fall Precaution Comments: R IJ tunnelled cath Restrictions Weight Bearing Restrictions: No    Mobility  Bed Mobility               General bed mobility comments: pt on commode upon PT arrival and returned to recliner chair at end of session  Transfers Overall transfer level: Needs assistance Equipment used: Rolling walker (2 wheeled);Straight cane Transfers: Sit to/from Stand Sit to Stand: Supervision         General transfer comment: increased time and effort to perform from commode and recliner  chair  Ambulation/Gait Ambulation/Gait assistance: Min guard Ambulation Distance (Feet): 200 Feet Assistive device: Straight cane Gait Pattern/deviations: Step-through pattern;Decreased stride length;Antalgic;Trunk flexed Gait velocity: decreased, able to significantly increased with VCs Gait velocity interpretation: Below normal speed for age/gender General Gait Details: supervision for safety. no LOB noted. VCs for upright posture, sequencing with SPC, and increased speed   Stairs Stairs: Yes   Stair Management: One rail Right;Step to pattern;Forwards;With cane Number of Stairs: 4(x2) General stair comments: min guard for safety and stability. improved technique with SPC requiring VCs to maintain pattern.  Wheelchair Mobility    Modified Rankin (Stroke Patients Only)       Balance Overall balance assessment: Needs assistance Sitting-balance support: No upper extremity supported;Feet supported Sitting balance-Leahy Scale: Good     Standing balance support: No upper extremity supported;Single extremity supported;During functional activity Standing balance-Leahy Scale: Fair Standing balance comment: pt able to perform unsupported static standing for pericare                            Cognition Arousal/Alertness: Awake/alert Behavior During Therapy: WFL for tasks assessed/performed Overall Cognitive Status: Impaired/Different from baseline Area of Impairment: Following commands;Safety/judgement;Awareness;Problem solving;Attention                   Current Attention Level: Selective   Following Commands: Follows one step commands with increased time;Follows one step commands inconsistently;Follows multi-step commands inconsistently;Follows multi-step commands with increased time Safety/Judgement: Decreased awareness of safety;Decreased awareness of deficits Awareness: Emergent Problem Solving: Slow processing;Requires verbal cues General Comments:  slight delay in processing. requires min  to mod cues to follow commands ~70% of the time      Exercises      General Comments General comments (skin integrity, edema, etc.): pt voice is hoarse      Pertinent Vitals/Pain Pain Assessment: Faces Faces Pain Scale: Hurts a little bit Pain Location: R LE (gout) Pain Descriptors / Indicators: Sore;Aching Pain Intervention(s): Monitored during session;Limited activity within patient's tolerance;Repositioned    Home Living                      Prior Function            PT Goals (current goals can now be found in the care plan section) Acute Rehab PT Goals Patient Stated Goal: Return home PT Goal Formulation: With patient Time For Goal Achievement: 03/20/18 Potential to Achieve Goals: Good Progress towards PT goals: Progressing toward goals(goal timeline updated)    Frequency    Min 3X/week      PT Plan Current plan remains appropriate    Co-evaluation              AM-PAC PT "6 Clicks" Daily Activity  Outcome Measure  Difficulty turning over in bed (including adjusting bedclothes, sheets and blankets)?: None Difficulty moving from lying on back to sitting on the side of the bed? : None Difficulty sitting down on and standing up from a chair with arms (e.g., wheelchair, bedside commode, etc,.)?: None Help needed moving to and from a bed to chair (including a wheelchair)?: None Help needed walking in hospital room?: A Little Help needed climbing 3-5 steps with a railing? : A Little 6 Click Score: 22    End of Session Equipment Utilized During Treatment: Gait belt Activity Tolerance: Patient tolerated treatment well Patient left: in chair;with call bell/phone within reach Nurse Communication: Mobility status PT Visit Diagnosis: Other abnormalities of gait and mobility (R26.89)     Time: 1345-1416 PT Time Calculation (min) (ACUTE ONLY): 31 min  Charges:  $Gait Training: 8-22 mins $Therapeutic  Activity: 8-22 mins                    G Codes:      Vic Ripper, SPT   Vic Ripper 03/06/2018, 4:31 PM

## 2018-03-07 DIAGNOSIS — Z7189 Other specified counseling: Secondary | ICD-10-CM

## 2018-03-07 DIAGNOSIS — Z515 Encounter for palliative care: Secondary | ICD-10-CM

## 2018-03-07 DIAGNOSIS — Z21 Asymptomatic human immunodeficiency virus [HIV] infection status: Secondary | ICD-10-CM

## 2018-03-07 LAB — CBC WITH DIFFERENTIAL/PLATELET
BASOS ABS: 0 10*3/uL (ref 0.0–0.1)
Basophils Relative: 0 %
EOS PCT: 0 %
Eosinophils Absolute: 0.1 10*3/uL (ref 0.0–0.7)
HEMATOCRIT: 35.9 % — AB (ref 39.0–52.0)
Hemoglobin: 11.5 g/dL — ABNORMAL LOW (ref 13.0–17.0)
LYMPHS PCT: 15 %
Lymphs Abs: 2.1 10*3/uL (ref 0.7–4.0)
MCH: 31.6 pg (ref 26.0–34.0)
MCHC: 32 g/dL (ref 30.0–36.0)
MCV: 98.6 fL (ref 78.0–100.0)
Monocytes Absolute: 1.1 10*3/uL — ABNORMAL HIGH (ref 0.1–1.0)
Monocytes Relative: 8 %
NEUTROS ABS: 10.5 10*3/uL — AB (ref 1.7–7.7)
Neutrophils Relative %: 77 %
PLATELETS: 267 10*3/uL (ref 150–400)
RBC: 3.64 MIL/uL — AB (ref 4.22–5.81)
RDW: 12.6 % (ref 11.5–15.5)
WBC: 13.8 10*3/uL — AB (ref 4.0–10.5)

## 2018-03-07 LAB — RENAL FUNCTION PANEL
ALBUMIN: 3.2 g/dL — AB (ref 3.5–5.0)
ANION GAP: 18 — AB (ref 5–15)
BUN: 81 mg/dL — ABNORMAL HIGH (ref 6–20)
CALCIUM: 9.4 mg/dL (ref 8.9–10.3)
CO2: 23 mmol/L (ref 22–32)
CREATININE: 8.75 mg/dL — AB (ref 0.61–1.24)
Chloride: 94 mmol/L — ABNORMAL LOW (ref 101–111)
GFR calc Af Amer: 6 mL/min — ABNORMAL LOW (ref >60)
GFR calc non Af Amer: 5 mL/min — ABNORMAL LOW (ref >60)
GLUCOSE: 89 mg/dL (ref 65–99)
PHOSPHORUS: 6.3 mg/dL — AB (ref 2.5–4.6)
Potassium: 4.6 mmol/L (ref 3.5–5.1)
SODIUM: 135 mmol/L (ref 135–145)

## 2018-03-07 LAB — CBC
HCT: 41.9 % (ref 39.0–52.0)
Hemoglobin: 13.9 g/dL (ref 13.0–17.0)
MCH: 33.3 pg (ref 26.0–34.0)
MCHC: 33.2 g/dL (ref 30.0–36.0)
MCV: 100.5 fL — ABNORMAL HIGH (ref 78.0–100.0)
Platelets: 314 10*3/uL (ref 150–400)
RBC: 4.17 MIL/uL — ABNORMAL LOW (ref 4.22–5.81)
RDW: 13.1 % (ref 11.5–15.5)
WBC: 17.8 10*3/uL — ABNORMAL HIGH (ref 4.0–10.5)

## 2018-03-07 LAB — PROTIME-INR
INR: 1.44
PROTHROMBIN TIME: 17.5 s — AB (ref 11.4–15.2)

## 2018-03-07 LAB — BASIC METABOLIC PANEL
ANION GAP: 16 — AB (ref 5–15)
BUN: 77 mg/dL — ABNORMAL HIGH (ref 6–20)
CALCIUM: 8.8 mg/dL — AB (ref 8.9–10.3)
CO2: 22 mmol/L (ref 22–32)
Chloride: 96 mmol/L — ABNORMAL LOW (ref 101–111)
Creatinine, Ser: 8.17 mg/dL — ABNORMAL HIGH (ref 0.61–1.24)
GFR, EST AFRICAN AMERICAN: 7 mL/min — AB (ref >60)
GFR, EST NON AFRICAN AMERICAN: 6 mL/min — AB (ref >60)
GLUCOSE: 105 mg/dL — AB (ref 65–99)
Potassium: 6.4 mmol/L (ref 3.5–5.1)
Sodium: 134 mmol/L — ABNORMAL LOW (ref 135–145)

## 2018-03-07 LAB — MAGNESIUM: Magnesium: 2.4 mg/dL (ref 1.7–2.4)

## 2018-03-07 LAB — HEPARIN LEVEL (UNFRACTIONATED): Heparin Unfractionated: 0.54 IU/mL (ref 0.30–0.70)

## 2018-03-07 LAB — GLUCOSE, CAPILLARY: GLUCOSE-CAPILLARY: 129 mg/dL — AB (ref 65–99)

## 2018-03-07 MED ORDER — DEXTROSE 50 % IV SOLN
1.0000 | Freq: Once | INTRAVENOUS | Status: AC
  Administered 2018-03-07: 50 mL via INTRAVENOUS
  Filled 2018-03-07: qty 50

## 2018-03-07 MED ORDER — INSULIN ASPART 100 UNIT/ML IV SOLN
10.0000 [IU] | Freq: Once | INTRAVENOUS | Status: AC
  Administered 2018-03-07: 10 [IU] via INTRAVENOUS

## 2018-03-07 MED ORDER — WARFARIN SODIUM 7.5 MG PO TABS
7.5000 mg | ORAL_TABLET | Freq: Once | ORAL | Status: AC
  Administered 2018-03-07: 7.5 mg via ORAL
  Filled 2018-03-07: qty 1

## 2018-03-07 MED ORDER — SEVELAMER CARBONATE 0.8 G PO PACK
1.6000 g | PACK | Freq: Three times a day (TID) | ORAL | Status: DC
  Administered 2018-03-07 – 2018-03-08 (×2): 1.6 g via ORAL
  Filled 2018-03-07 (×5): qty 2

## 2018-03-07 MED ORDER — HEPARIN SODIUM (PORCINE) 1000 UNIT/ML DIALYSIS: 20.0000 [IU]/kg | INTRAMUSCULAR | Status: DC | PRN

## 2018-03-07 MED ORDER — CALCIUM GLUCONATE 10 % IV SOLN: 1.0000 g | Freq: Once | INTRAVENOUS | Status: DC

## 2018-03-07 MED ORDER — SODIUM CHLORIDE 0.9 % IV SOLN
1.0000 g | Freq: Once | INTRAVENOUS | Status: AC
  Administered 2018-03-07: 1 g via INTRAVENOUS
  Filled 2018-03-07: qty 10

## 2018-03-07 NOTE — Progress Notes (Signed)
PROGRESS NOTE  David [name] TKP:[id number] DOB: 13-Aug-1941 DOA: 02/17/2018 PCP: [name], NP  HPI/Recap of past 12 hours: 77 year old male with PMHx of HIV/AIDS, Prostate Ca, S/P Brachytherapy, CKD stage V not on HD PTA, Nonischemic Cardiomyopathy with EF 51-70%YFV Diastolic dysfunction, and HTN, presented to ED on 3/17, wife reported that patient has had a dry cough last several days however, other than that has been in usual state on health.  On the morning of admission, around 2-3 am, patient woke up with severe shortness of breath. Upon arrival to ED patient was noted to be agonal and pulseless in parking lot. CPR started and continued for estimated 5-10 minutes before return of ROSC. Intubated. CXR consistent with pulmonary edema. Creatinine 5.32, LA 4.44.  Patient was admitted by critical care service. TRH assumed care.  Today, patient has no new complaints, denies any chest pain, worsening shortness of breath, fever/chills.  Noted to have hyperkalemia today, repeat was within normal limits.  Plan for HD today.   Assessment/Plan: Principal Problem:   HIV (human immunodeficiency virus infection) ([place]) Active Problems:   Respiratory arrest ([place])   Acute pulmonary edema (HCC)   Acute on chronic systolic heart failure (HCC)   Chronic obstructive pulmonary disease (HCC)   Cardiac arrest (HCC)   Acute on chronic systolic and diastolic heart failure, NYHA class 4 (HCC)   ESRD (end stage renal disease) ([place])   Acute respiratory failure with hypoxemia ([place])   Palliative care by specialist   Advance care planning   Goals of care, counseling/discussion  New ESRD on hemodialysis Creatinine 5.32 on admission, follows Dr. Posey Pronto in office.  AV fistula placed on 3/25. Volume management with HD Per nephrology, poor prognosis, recommended palliative care consult for goals of care. Patient has been tolerating HD, however has poor cardiac function and clotted AVG, may not be able  to tolerate outpatient HD Clipped to start on TTS schedule as outpt Per renal short session of HD-on 4/3 due to hyperkalemia and will get on TTS schedule Palliative consulted: Patient wants full scope of treatment, wants to be full code.  Palliative will follow as an outpatient  PEA, asystole arrest ROSC in 5-10mins, Acute on chronic systolic and diastolic HF, nonischemic cardiomyopathy Patient had cardiac arrest with neurological recovery PCCM/Cardiology was consulted BNP 800, 2D echo showed EF of 15-20%, slightly down from the prior report of 20-25% Mildly elevated troponin likely secondary to demand ischemia and chest compressions. EKG showed left bundle branch block, unclear chronicity. Patient underwent cardiac cath on 3/20 which showed nonobstructive CAD and recommended continue medical therapy BP still soft, continue to hold Coreg (was on 6.25 mg twice a day) , ACE.  Not a candidate for ICD per cardiology  New onset Atrial fibrillation Currently rate controlled with sinus rhythm Cardiology on board: continue amiodarone 200 mg daily Patient also started on warfarin, continue heparin drip until therapeutic INR  BP still soft, Coreg was placed on hold we will restart once BP stable   Acute respiratory failure with hypoxemia 2/2 acute pulmonary edema Resolved, on room air saturating well On arrival to ED, patient was noted to be agonal, pulseless, had PEA asystole arrest, had CPR and ACLS, ROSC 5-10 minutes.  Patient was intubated.  Extubated on 3/19  Acute gout in feet Improving, elevated ESR and CRP, uric acid 5.4.   Continue colchicine and prednisone  Chronic obstructive pulmonary disease Stable Chest x-ray showed cardiomegaly and perihilar opacities concerning for pulmonary edema  HIV Undetectable HIV RNA, CD4 430 Continue Tivicay and Descovy  Anemia of chronic disease HGB stable  Prostate cancer status post brachytherapy     Code Status: Full  Family  Communication: None at bedside  Disposition Plan: Plan for discharge on 03/08/18   Consultants:  Cardiology  PCCM  Nephrology  Procedures:  Intubated 3/17  Echocardiogram 3/18 EF 15-20%  RIJ tunneled catheter placement 3/18  Extubated 3/19  HD initiated 3/19  Heart catheterization 3/20, no culprit lesion  Antimicrobials:  None  DVT prophylaxis: Heparin   Objective: Vitals:   03/07/18 1445 03/07/18 1515 03/07/18 1530 03/07/18 1637  BP: (!) 98/52 104/63 101/63   Pulse: 92 87 87   Resp:   18   Temp:   97.9 F (36.6 C)   TempSrc:   Oral   SpO2:   99%   Weight:    80.1 kg (176 lb 9.4 oz)  Height:        Intake/Output Summary (Last 24 hours) at 03/07/2018 1804 Last data filed at 03/07/2018 1700 Gross per 24 hour  Intake 290 ml  Output 1500 ml  Net -1210 ml   Filed Weights   03/06/18 2300 03/07/18 1125 03/07/18 1637  Weight: 81.8 kg (180 lb 6.4 oz) 81.3 kg (179 lb 3.7 oz) 80.1 kg (176 lb 9.4 oz)    Exam:   General: NAD  Cardiovascular: S1, S2 present  Respiratory: Chest clear to auscultation bilaterally  Abdomen: Soft, nontender, nondistended, bowel sounds present  Musculoskeletal: No pedal edema bilaterally  Skin: Normal  Psychiatry: Normal mood   Data Reviewed: CBC: Recent Labs  Lab 03/02/18 0717 03/04/18 0021 03/05/18 0621 03/06/18 0500 03/07/18 0507  WBC 18.4* 15.6* 13.3* 15.3* 13.8*  NEUTROABS  --   --   --   --  10.5*  HGB 12.5* 11.3* 11.5* 10.6* 11.5*  HCT 38.2* 34.5* 34.4* 33.3* 35.9*  MCV 98.7 99.4 98.6 97.7 98.6  PLT 230 255 256 291 601   Basic Metabolic Panel: Recent Labs  Lab 03/02/18 0717 03/03/18 0541 03/04/18 0021 03/05/18 0621 03/06/18 0500 03/07/18 0507 03/07/18 1130  NA 139  --  134* 132* 131* 134* 135  K 4.5  --  5.5* 5.1 5.0 6.4* 4.6  CL 95*  --  94* 91* 91* 96* 94*  CO2 23  --  20* 22 21* 22 23  GLUCOSE 134*  --  118* 137* 108* 105* 89  BUN 92*  --  81* 59* 89* 77* 81*  CREATININE 11.79*  --  10.38*  8.04* 9.82* 8.17* 8.75*  CALCIUM 9.3  --  8.7* 8.7* 8.7* 8.8* 9.4  MG 2.5* 2.4 2.7* 2.4 2.5* 2.4  --   PHOS 11.3*  --   --  8.6*  --   --  6.3*   GFR: Estimated Creatinine Clearance: 8.3 mL/min (A) (by C-G formula based on SCr of 8.75 mg/dL (H)). Liver Function Tests: Recent Labs  Lab 03/02/18 0717 03/05/18 0621 03/07/18 1130  ALBUMIN 3.3* 3.1* 3.2*   No results for input(s): LIPASE, AMYLASE in the last 168 hours. No results for input(s): AMMONIA in the last 168 hours. Coagulation Profile: Recent Labs  Lab 03/04/18 0021 03/05/18 0621 03/06/18 0500 03/07/18 0507  INR 1.14 1.36 1.59 1.44   Cardiac Enzymes: No results for input(s): CKTOTAL, CKMB, CKMBINDEX, TROPONINI in the last 168 hours. BNP (last 3 results) No results for input(s): PROBNP in the last 8760 hours. HbA1C: No results for input(s): HGBA1C in the last 72 hours.  CBG: Recent Labs  Lab 03/03/18 0720 03/03/18 1149 03/03/18 1645 03/04/18 1151 03/07/18 0856  GLUCAP 113* 141* 218* 106* 129*   Lipid Profile: No results for input(s): CHOL, HDL, LDLCALC, TRIG, CHOLHDL, LDLDIRECT in the last 72 hours. Thyroid Function Tests: No results for input(s): TSH, T4TOTAL, FREET4, T3FREE, THYROIDAB in the last 72 hours. Anemia Panel: No results for input(s): VITAMINB12, FOLATE, FERRITIN, TIBC, IRON, RETICCTPCT in the last 72 hours. Urine analysis:    Component Value Date/Time   COLORURINE YELLOW 02/17/2018 0540   APPEARANCEUR CLOUDY (A) 02/17/2018 0540   LABSPEC 1.010 02/17/2018 0540   PHURINE 5.0 02/17/2018 0540   GLUCOSEU NEGATIVE 02/17/2018 0540   HGBUR LARGE (A) 02/17/2018 0540   BILIRUBINUR NEGATIVE 02/17/2018 0540   KETONESUR NEGATIVE 02/17/2018 0540   PROTEINUR 100 (A) 02/17/2018 0540   NITRITE NEGATIVE 02/17/2018 0540   LEUKOCYTESUR NEGATIVE 02/17/2018 0540   Sepsis Labs: _0 (procalcitonin:4,lacticidven:4)  )No results found for this or any previous visit (from the past 240 hour(s)).     Studies: No results found.  Scheduled Meds: . amiodarone  200 mg Oral Daily  . Chlorhexidine Gluconate Cloth  6 each Topical Daily  . colchicine  0.6 mg Oral Daily  . dolutegravir  50 mg Oral Daily  . emtricitabine-tenofovir AF  1 tablet Oral Daily  . feeding supplement (NEPRO CARB STEADY)  237 mL Oral Q1400  . feeding supplement (PRO-STAT SUGAR FREE 64)  30 mL Oral BID  . mouth rinse  15 mL Mouth Rinse BID  . midodrine  5 mg Oral BID WC  . predniSONE  30 mg Oral Q breakfast  . scopolamine  1 patch Transdermal Q72H  . senna-docusate  1 tablet Oral BID  . sevelamer carbonate  1.6 g Oral TID WC  . Warfarin - Pharmacist Dosing Inpatient   Does not apply q1800    Continuous Infusions: . heparin 1,200 Units/hr (03/06/18 2240)     LOS: 18 days     Alma Friendly, MD [facility]   If 7PM-7AM, please contact night-coverage [email or web address] Password [facility] 03/07/2018, 6:04 PM

## 2018-03-07 NOTE — Progress Notes (Signed)
[facility] Progress Note    Subjective:   No new complaints   Objective:   BP 128/78 (BP Location: Right Arm)   Pulse 64   Temp 98.4 F (36.9 C) (Oral)   Resp 18   Ht 6\' 2"  (1.88 m)   Wt 81.3 kg (179 lb 3.7 oz)   SpO2 100%   BMI 23.01 kg/m   Intake/Output: I/O last 3 completed shifts: In: 290 [P.O.:290] Out: 2000 [Other:2000]   Intake/Output this shift:  Total I/O In: 240 [P.O.:240] Out: 0  Weight change:   Physical Exam: Gen: NAD CVS: no rub Resp:cta Abd: benign Ext: no edema  Labs: BMET Recent Labs  Lab 03/02/18 0717 03/04/18 0021 03/05/18 0621 03/06/18 0500 03/07/18 0507  NA 139 134* 132* 131* 134*  K 4.5 5.5* 5.1 5.0 6.4*  CL 95* 94* 91* 91* 96*  CO2 23 20* 22 21* 22  GLUCOSE 134* 118* 137* 108* 105*  BUN 92* 81* 59* 89* 77*  CREATININE 11.79* 10.38* 8.04* 9.82* 8.17*  ALBUMIN 3.3*  --  3.1*  --   --   CALCIUM 9.3 8.7* 8.7* 8.7* 8.8*  PHOS 11.3*  --  8.6*  --   --    CBC Recent Labs  Lab 03/04/18 0021 03/05/18 0621 03/06/18 0500 03/07/18 0507  WBC 15.6* 13.3* 15.3* 13.8*  NEUTROABS  --   --   --  10.5*  HGB 11.3* 11.5* 10.6* 11.5*  HCT 34.5* 34.4* 33.3* 35.9*  MCV 99.4 98.6 97.7 98.6  PLT 255 256 291 267    @IMGRELPRIORS @ Medications:    . amiodarone  200 mg Oral Daily  . Chlorhexidine Gluconate Cloth  6 each Topical Daily  . colchicine  0.6 mg Oral Daily  . dolutegravir  50 mg Oral Daily  . emtricitabine-tenofovir AF  1 tablet Oral Daily  . feeding supplement (NEPRO CARB STEADY)  237 mL Oral Q1400  . feeding supplement (PRO-STAT SUGAR FREE 64)  30 mL Oral BID  . mouth rinse  15 mL Mouth Rinse BID  . midodrine  5 mg Oral BID WC  . predniSONE  30 mg Oral Q breakfast  . scopolamine  1 patch Transdermal Q72H  . senna-docusate  1 tablet Oral BID  . sevelamer carbonate  1.6 g Oral TID WC  . Warfarin - Pharmacist Dosing Inpatient   Does not apply [id number]     Assessment/ Plan:   1. New onset paroxysmal A fib- on  heparin and for coumadin 2. Hyperkalemia- unclear why it should be higher after HD yesterday.  Discussed with HD RN and catheter functioned well.  Will plan for HD again today and recheck prior.  Will use low K bath.  Floor RN reports compliance with renal diet. 3. 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 Currently on midodrine 4. ESRDBP borderline. Would consider PD as an alternative given his poor cardiac function and clotted AVG. Prognosis is poor given mounting end-stage disease processes.He is set up for TTS schedule at [facility] 11:45. Continue with TTS schedule. 5. 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 Nutrition:renal diet 8. Hypertension:low BP on midodrine  9. Vascular access- clotted AVG due to low cardiac output 10. Disposition- poor prognosis and is full code. Would recommend palliative care consult to help set goals/limits of care. I am not sure his heart will be able to tolerate outpatient HD since he can't even keep an  AVG patent. Might need to transition to PD if he remains aggressive with his care.INR not yet therapeutic. Will change to TTS schedule and is accepted at [facility] TTS second shift once stable for discharge.    [name], MD [place] Pager [phone] 03/07/2018, 12:01 PM

## 2018-03-07 NOTE — Social Work (Addendum)
CSW intern spoke with Mrs. David [name] via phone call regarding transportation service for patient to dialysis.  Mrs. David [name] informed [place] intern that she would like to hold off on transportation services due to [name]  possibly going home with hospice or to a hospice facility. CSW intern expressed empathic understanding and advised Mrs.David [name] to take her time. Mrs. David [name] reported that she will be at the hospital later this afternoon and will ask to speak with CSW intern  regarding transportation decision. CSW intern will continue to follow and provide Social Work intervention services as needed through discharge.    Room Visit: 4:00pm - CSW intern met with Mrs. David [name] while the patient was at dialysis. Mrs. David [name] reported that she still wants to hold  off on transportation services until she talks with [name]. CSW intern expressed understanding and provided CSW work phone number CSW and CSW intern will continue to follow and provide Social Work intervention services as needed through discharge.   Massie Kluver, [place] Intern

## 2018-03-07 NOTE — Consult Note (Signed)
Consultation Note Date: 03/07/2018   Patient Name: David [name]  DOB: 05-27-1941  MRN: [id number]  Age / Sex: 77 y.o., male  PCP: [name], NP Referring Physician: Alma Friendly, MD  Reason for Consultation: Establishing goals of care  HPI/Patient Profile: 77 y.o. male  with past medical history of CHF (EF 20%), CAD (cath 02/2018 showed non obstructing), DM2, CKD (was being prepared for dialysis prior to admission), HIV+(stable on retroviral tx) admitted on 02/17/2018 with PEA arrest d/t pulmonary edema from CHF requiring intubation. Extubated successfully. Eating. Ambulating. During admission he has been started on HD. He has had AVMs which have clotted likely due to low cardiac output. HD through HD cath today, functioning well. Additionally he has developed a. Fib requiring heparin drip, awaiting therapeutic INR. CLIP completed and he has chair assignment. Palliative medicine consulted for [place].   Clinical Assessment and Goals of Care:  I have reviewed medical records including EPIC notes, labs and imaging, assessed the patient and then met with the patient and separately with the patient's wife to discuss diagnosis prognosis, GOC, EOL wishes, disposition and options.  I introduced Palliative Medicine as specialized medical care for people living with serious illness. It focuses on providing relief from the symptoms and stress of a serious illness. The goal is to improve quality of life for both the patient and the family.  We discussed a brief life review of the patient. He is retired for [employer].    As far as functional and nutritional status - there has been decline in his functional status. He has been able to leave his house if his wife drives, but he has not been able to drive. He used to enjoy collecting scrap metal and taking it to the [place] but since November he  hasn't been able to do that.   We discussed their current illness and what it means in the larger context of their on-going co-morbidities.  Natural disease trajectory and expectations at EOL were discussed. Patient had expectations that dialysis would make his heart stronger. We gently reviewed that CHF is a progressive terminal illness. Reviewed that overall trajectory of CHF and the goals of dialysis (increase function and prolong life, treat symptoms of fluid overload from kidney failure). We discussed how patient's co-morbidities are making his dialysis difficult and could limit the amount of time that dialysis improves his function and quality of life.   I attempted to elicit values and goals of care important to the patient. Being able to care for himself and be independent is very important to him. He stated he would never want to be a burden on anyone. If he got to a point where he was only able to lay around he would rather "just go on". But until then he would rather continue aggressive care.     The difference between aggressive medical intervention and comfort care was considered in light of the patient's goals of care.   Hospice and Palliative Care services outpatient were explained and  offered. They would like to be followed by outpatient Palliative services.   Questions and concerns were addressed.  The family was encouraged to call with questions or concerns. MOST form and Advanced Directives paperwork were reviewed.   Primary Decision Maker PATIENT and his spouse- David [name]    SUMMARY OF RECOMMENDATIONS: - Continue full scope care -Patient sets limits of: when he can no longer care for himself, or can't walk, or ride in his truck, then he wold consider stopping dialysis and transitioning to comfort care -He wishes to remain full code as he has had success with CPR and intubation this admission -Case manager referral for outpatient Palliative to follow, resources for assistance  with transportation    Code Status/Advance Care Planning:  Full code  Palliative Prophylaxis:   Delirium Protocol  Prognosis:    < 6 months due to end stage CHF, declining functional status, ongoing issues with HD access  Discharge Planning: Home with Palliative Services  Primary Diagnoses: Present on Admission: . Cardiac arrest ([place]) . Respiratory arrest ([place]) . Acute pulmonary edema (H[facility]) . Acute on chronic systolic heart failure ([place]) . Chronic obstructive pulmonary disease ([place]) . HIV (human immunodeficiency virus infection) ([place]) . Acute on chronic systolic and diastolic heart failure, NYHA class 4 ([place])   I have reviewed the medical record, interviewed the patient and family, and examined the patient. The following aspects are pertinent.  Past Medical History:  Diagnosis Date  . Acute on chronic systolic and diastolic heart failure, NYHA class 4 ([place])   . Arthritis    "hands, right knee, feet" (02/21/2018)  . Cardiac arrest ([place]) 02/17/2018  . CHF (congestive heart failure) ([place])   . Chronic lower back pain   . CKD (chronic kidney disease) stage V requiring chronic dialysis ([place])   . ESRD on dialysis ([place])    started 02/2018  . Gout    daily RX (02/21/2018)  . HIV (human immunodeficiency virus infection) ([place])   . Hypertension   . Pneumonia 11/2017  . Prostate cancer [facility])    Social History   Socioeconomic History  . Marital status: Married    Spouse name: Not on file  . Number of children: Not on file  . Years of education: Not on file  . Highest education level: Not on file  Occupational History  . Not on file  Social Needs  . Financial resource strain: Not on file  . Food insecurity:    Worry: Not on file    Inability: Not on file  . Transportation needs:    Medical: Not on file    Non-medical: Not on file  Tobacco Use  . Smoking status: Former Smoker    Packs/day: 1.00    Years: 30.00    Pack years: 30.00    Types: Cigarettes    Last  attempt to quit: 2006    Years since quitting: 13.2  . Smokeless tobacco: Never Used  Substance and Sexual Activity  . Alcohol use: Not Currently  . Drug use: Never  . Sexual activity: Not Currently  Lifestyle  . Physical activity:    Days per week: Not on file    Minutes per session: Not on file  . Stress: Not on file  Relationships  . Social connections:    Talks on phone: Not on file    Gets together: Not on file    Attends religious service: Not on file    Active member of club or organization: Not on file  Attends meetings of clubs or organizations: Not on file    Relationship status: Not on file  Other Topics Concern  . Not on file  Social History Narrative  . Not on file   History reviewed. No pertinent family history. Scheduled Meds: . amiodarone  200 mg Oral Daily  . Chlorhexidine Gluconate Cloth  6 each Topical Daily  . colchicine  0.6 mg Oral Daily  . dolutegravir  50 mg Oral Daily  . emtricitabine-tenofovir AF  1 tablet Oral Daily  . feeding supplement (NEPRO CARB STEADY)  237 mL Oral Q1400  . feeding supplement (PRO-STAT SUGAR FREE 64)  30 mL Oral BID  . mouth rinse  15 mL Mouth Rinse BID  . midodrine  5 mg Oral BID WC  . predniSONE  30 mg Oral Q breakfast  . scopolamine  1 patch Transdermal Q72H  . senna-docusate  1 tablet Oral BID  . sevelamer carbonate  1.6 g Oral TID WC  . warfarin  7.5 mg Oral ONCE-1800  . Warfarin - Pharmacist Dosing Inpatient   Does not apply q1800   Continuous Infusions: . heparin 1,200 Units/hr (03/06/18 2240)   PRN Meds:.heparin, oxyCODONE-acetaminophen, polyethylene glycol Medications Prior to Admission:  Prior to Admission medications   Medication Sig Start Date End Date Taking? Authorizing Provider  allopurinol (ZYLOPRIM) 100 MG tablet Take 100 mg by mouth every morning. 01/23/18  Yes [provider]  amLODipine (NORVASC) 10 MG tablet Take 10 mg by mouth every morning. 12/05/17  Yes [provider]    aspirin EC 81 MG tablet Take 81 mg by mouth daily.   Yes [provider]  brimonidine (ALPHAGAN) 0.15 % ophthalmic solution Place 1 drop into the left eye 2 (two) times daily. 11/15/17  Yes [provider]  carvedilol (COREG) 25 MG tablet Take 25 mg by mouth 2 (two) times daily. 12/05/17  Yes [provider]  colchicine 0.6 MG tablet Take 0.6 mg by mouth See admin instructions. Taker 1 tablet (0.75m) once daily for 7 days just when having flare up.   Yes [provider]  DESCOVY 200-25 MG tablet Take 1 tablet by mouth every morning. 01/20/18  Yes [provider]  dorzolamide-timolol (COSOPT) 22.3-6.8 MG/ML ophthalmic solution Place 1 drop into the left eye 2 (two) times daily. 12/17/17  Yes [provider]  furosemide (LASIX) 80 MG tablet Take 160 mg by mouth 2 (two) times daily. 01/22/18  Yes [provider]  latanoprost (XALATAN) 0.005 % ophthalmic solution Place 1 drop into the left eye at bedtime. 11/14/17  Yes [provider]  potassium chloride (K-DUR) 10 MEQ tablet Take 20 mEq by mouth daily.   Yes [provider]  terazosin (HYTRIN) 10 MG capsule Take 10 mg by mouth at bedtime.   Yes [provider]  TIVICAY 50 MG tablet Take 50 mg by mouth every morning. 01/20/18  Yes [provider]  Vitamin D, Ergocalciferol, (DRISDOL) 50000 units CAPS capsule Take 50,000 Units by mouth once a week. 02/11/18  Yes [provider]  sulfamethoxazole-trimethoprim (BACTRIM DS,SEPTRA DS) 800-160 MG tablet Take 1 tablet by mouth 3 (three) times a week.    [provider]   No Known Allergies Review of Systems  Constitutional: Positive for activity change.  Respiratory: Positive for shortness of breath.     Physical Exam  Constitutional: He is oriented to person, place, and time. He appears well-developed and well-nourished.  Cardiovascular:  tachycardic  Pulmonary/Chest: Effort normal.  Abdominal: Soft.  Neurological: He is alert and oriented to person, place, and time.  Skin: Skin is warm and dry.  Psychiatric: He has a normal mood and affect. His behavior is normal.  Nursing note and vitals reviewed.   Vital Signs: BP 118/65   Pulse 72   Temp 98.4 F (36.9 C) (Oral)   Resp 17   Ht 6' 2"  (1.88 m)   Wt 81.3 kg (179 lb 3.7 oz)   SpO2 100%   BMI 23.01 kg/m  Pain Scale: 0-10 POSS *See Group Information*: 1-Acceptable,Awake and alert Pain Score: 0-No pain   SpO2: SpO2: 100 % O2 Device:SpO2: 100 % O2 Flow Rate: .O2 Flow Rate (L/min): 2 L/min  IO: Intake/output summary:   Intake/Output Summary (Last 24 hours) at 03/07/2018 1311 Last data filed at 03/07/2018 0900 Gross per 24 hour  Intake 290 ml  Output 0 ml  Net 290 ml    LBM: Last BM Date: 03/06/18 Baseline Weight: Weight: 93.7 kg (206 lb 9.1 oz) Most recent weight: Weight: 81.3 kg (179 lb 3.7 oz)     Palliative Assessment/Data: PPS: 60%     Thank you for this consult. Palliative medicine will continue to follow and assist as needed.   Time In: 1130 Time Out: 1330 Time Total: 120 mins Prolonged services billed: YES Greater than 50%  of this time was spent counseling and coordinating care related to the above assessment and plan.  Signed by: Mariana Kaufman, AGNP-C Palliative Medicine    Please contact Palliative Medicine Team phone at [phone] for questions and concerns.  For individual provider: See Shea Evans

## 2018-03-07 NOTE — Progress Notes (Signed)
[place] for heparin/warfarin Indication: atrial fibrillation  No Known Allergies  Patient Measurements: Height: 6\' 2"  (188 cm) Weight: 180 lb 6.4 oz (81.8 kg) IBW/kg (Calculated) : 82.2 Heparin Dosing Weight: 83 kg   Vital Signs: Temp: 97.8 F (36.6 C) (04/04 0851) Temp Source: Oral (04/04 0851) BP: 134/81 (04/04 0851) Pulse Rate: 73 (04/04 0851)  Labs: Recent Labs    03/05/18 0621 03/06/18 0500 03/07/18 0507  HGB 11.5* 10.6* 11.5*  HCT 34.4* 33.3* 35.9*  PLT 256 291 267  LABPROT 16.6* 18.9* 17.5*  INR 1.36 1.59 1.44  HEPARINUNFRC 0.40 0.53 0.54  CREATININE 8.04* 9.82* 8.17*    Estimated Creatinine Clearance: 9 mL/min (A) (by C-G formula based on SCr of 8.17 mg/dL (H)).   Medications:  Infusions:  . calcium gluconate 1 g (03/07/18 1005)  . heparin 1,200 Units/hr (03/06/18 2240)    Assessment: 23 YOM with PEA arrest, cath showing nonobstructive CAD,  found in Afib. Pharmacy consulted on 03/03/18 for heparin and warfarin dosing.  No anticoagulation PTA.  Heparin level remains therapeutic this morning at 0.54 on 1200 units/hr. INR remains subtherapeutic at 1.44.  Likely decreased today due to missed dose on 4/2, pt vomited up dose. Hgb low/stable , pltc wnl.  No bleeding noted.  Potential significant drug-drug interaction with amiodarone noted (increase on coumadin effec) started 3/31 on Amiodarone loading dose 400mg  TID>>Cardiologist reduced Amio to 200mg  daily starting today 4/4.  Noted that patient did not receive any amiodarone doses on 4/3 due to patient was in HD in the morning then amiodarone order was changed to the 200mg  daily dose to start 4/4. [name] made aware of missed amiodarone dose/s on 4/3 and he is okay with proceeding with the 200mg  daily dose today. Pt in NSR.  Heparin to coumadin bridge continues.    Goal of Therapy:  INR 2-3 Heparin level 0.3-0.7 units/ml Monitor platelets by anticoagulation protocol:  Yes   Plan:  Continue heparin drip at 1200 units/hr Warfarin 7.5 mg PO tonight Daily heparin level, INR, CBC Monitor for s/sx of bleeding  Nicole Cella, RPh Clinical Pharmacist Clinical phone for 03/07/2018 until 4p is [phone] After 4p, please call Main Rx at [phone] for assistance 03/07/2018 12:01 PM

## 2018-03-08 LAB — CBC WITH DIFFERENTIAL/PLATELET
Basophils Absolute: 0 10*3/uL (ref 0.0–0.1)
Basophils Relative: 0 %
Eosinophils Absolute: 0 10*3/uL (ref 0.0–0.7)
Eosinophils Relative: 0 %
HEMATOCRIT: 39.5 % (ref 39.0–52.0)
Hemoglobin: 12.8 g/dL — ABNORMAL LOW (ref 13.0–17.0)
LYMPHS PCT: 15 %
Lymphs Abs: 2.3 10*3/uL (ref 0.7–4.0)
MCH: 32 pg (ref 26.0–34.0)
MCHC: 32.4 g/dL (ref 30.0–36.0)
MCV: 98.8 fL (ref 78.0–100.0)
MONO ABS: 1 10*3/uL (ref 0.1–1.0)
MONOS PCT: 7 %
Neutro Abs: 11.4 10*3/uL — ABNORMAL HIGH (ref 1.7–7.7)
Neutrophils Relative %: 78 %
Platelets: 265 10*3/uL (ref 150–400)
RBC: 4 MIL/uL — ABNORMAL LOW (ref 4.22–5.81)
RDW: 12.8 % (ref 11.5–15.5)
WBC: 14.7 10*3/uL — ABNORMAL HIGH (ref 4.0–10.5)

## 2018-03-08 LAB — HEPARIN LEVEL (UNFRACTIONATED): Heparin Unfractionated: 0.34 IU/mL (ref 0.30–0.70)

## 2018-03-08 LAB — RENAL FUNCTION PANEL
Albumin: 3.7 g/dL (ref 3.5–5.0)
Anion gap: 17 — ABNORMAL HIGH (ref 5–15)
BUN: 44 mg/dL — AB (ref 6–20)
CHLORIDE: 91 mmol/L — AB (ref 101–111)
CO2: 24 mmol/L (ref 22–32)
Calcium: 9.4 mg/dL (ref 8.9–10.3)
Creatinine, Ser: 6.75 mg/dL — ABNORMAL HIGH (ref 0.61–1.24)
GFR calc Af Amer: 8 mL/min — ABNORMAL LOW (ref >60)
GFR, EST NON AFRICAN AMERICAN: 7 mL/min — AB (ref >60)
GLUCOSE: 107 mg/dL — AB (ref 65–99)
POTASSIUM: 5.1 mmol/L (ref 3.5–5.1)
Phosphorus: 6.8 mg/dL — ABNORMAL HIGH (ref 2.5–4.6)
Sodium: 132 mmol/L — ABNORMAL LOW (ref 135–145)

## 2018-03-08 LAB — MAGNESIUM: Magnesium: 2.2 mg/dL (ref 1.7–2.4)

## 2018-03-08 LAB — PROTIME-INR
INR: 1.51
Prothrombin Time: 18.1 seconds — ABNORMAL HIGH (ref 11.4–15.2)

## 2018-03-08 MED ORDER — WARFARIN SODIUM 5 MG PO TABS: 5.0000 mg | ORAL_TABLET | Freq: Every day | ORAL | 0 refills | Status: DC

## 2018-03-08 MED ORDER — SCOPOLAMINE 1 MG/3DAYS TD PT72: 1.0000 | MEDICATED_PATCH | TRANSDERMAL | 0 refills | Status: AC

## 2018-03-08 MED ORDER — CARVEDILOL 3.125 MG PO TABS: 3.1250 mg | ORAL_TABLET | Freq: Two times a day (BID) | ORAL | 0 refills | Status: DC

## 2018-03-08 MED ORDER — AMIODARONE HCL 200 MG PO TABS: 200.0000 mg | ORAL_TABLET | Freq: Every day | ORAL | 0 refills | Status: DC

## 2018-03-08 MED ORDER — MIDODRINE HCL 5 MG PO TABS: 5.0000 mg | ORAL_TABLET | Freq: Two times a day (BID) | ORAL | 0 refills | Status: AC

## 2018-03-08 MED ORDER — POLYETHYLENE GLYCOL 3350 17 G PO PACK: 17.0000 g | PACK | Freq: Every day | ORAL | 0 refills | Status: DC | PRN

## 2018-03-08 MED ORDER — WARFARIN SODIUM 7.5 MG PO TABS
7.5000 mg | ORAL_TABLET | Freq: Once | ORAL | Status: AC
  Administered 2018-03-08: 7.5 mg via ORAL
  Filled 2018-03-08: qty 1

## 2018-03-08 MED ORDER — SEVELAMER CARBONATE 0.8 G PO PACK: 1.6000 g | PACK | Freq: Three times a day (TID) | ORAL | 0 refills | Status: DC

## 2018-03-08 MED ORDER — LANTHANUM CARBONATE 500 MG PO CHEW
1000.0000 mg | CHEWABLE_TABLET | Freq: Three times a day (TID) | ORAL | Status: DC
  Administered 2018-03-08: 1000 mg via ORAL
  Filled 2018-03-08 (×2): qty 2

## 2018-03-08 MED ORDER — SENNOSIDES-DOCUSATE SODIUM 8.6-50 MG PO TABS: 1.0000 | ORAL_TABLET | Freq: Two times a day (BID) | ORAL | 0 refills | Status: DC

## 2018-03-08 NOTE — Progress Notes (Signed)
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  On the morning of admission, around 2-3 am, patient woke up with severe shortness of breath. Upon arrival to ED patient was noted to be agonal and pulseless in parking lot. CPR started and continued for estimated 5-10 minutes before return of ROSC. Intubated. CXR consistent with pulmonary edema. Creatinine 5.32, LA 4.44.  Patient was admitted by critical care service. TRH assumed care.  Today, patient has no new complaints, denies any chest pain, worsening shortness of breath, fever/chills.  Unable to be discharged today as patient cannot initiate outpatient dialysis on the weekend.  Patient will be discharged on 4/6 after dialysis.   Assessment/Plan: Principal Problem:   HIV (human immunodeficiency virus infection) ([place]) Active Problems:   Respiratory arrest ([place])   Acute pulmonary edema (HCC)   Acute on chronic systolic heart failure (HCC)   Chronic obstructive pulmonary disease (HCC)   Cardiac arrest (HCC)   Acute on chronic systolic and diastolic heart failure, NYHA class 4 (HCC)   ESRD (end stage renal disease) ([place])   Acute respiratory failure with hypoxemia ([place])   Palliative care by specialist   Advance care planning   Goals of care, counseling/discussion  New ESRD on hemodialysis Creatinine 5.32 on admission, follows Dr. Posey Pronto in office.  AV fistula placed on 3/25. Volume management with HD Per nephrology, poor prognosis, recommended palliative care consult for goals of care. Patient has been tolerating HD, however  has poor cardiac function and clotted AVG, may not be able to tolerate outpatient HD Clipped to start on TTS schedule as outpt Per renal short session of HD-on 4/3 due to hyperkalemia and will get on TTS schedule Palliative consulted: Patient wants full scope of treatment, wants to be full code.  Palliative will follow as an outpatient  PEA, asystole arrest ROSC in 5-10mins, Acute on chronic systolic and diastolic HF, nonischemic cardiomyopathy Patient had cardiac arrest with neurological recovery PCCM/Cardiology was consulted BNP 800, 2D echo showed EF of 15-20%, slightly down from the prior report of 20-25% Mildly elevated troponin likely secondary to demand ischemia and chest compressions. EKG showed left bundle branch block, unclear chronicity. Patient underwent cardiac cath on 3/20 which showed nonobstructive CAD and recommended continue medical therapy BP still soft, continue to hold Coreg (was on 6.25 mg twice a day) , ACE.  Not a candidate for ICD per cardiology  New onset Atrial fibrillation Currently rate controlled with sinus rhythm Cardiology on board: continue amiodarone 200 mg daily Patient also started on warfarin, continue heparin drip until therapeutic INR  BP still soft, Coreg was placed on hold we will restart once BP stable   Acute respiratory failure with hypoxemia 2/2 acute pulmonary edema Resolved, on room air saturating well On arrival to ED, patient was noted to be agonal, pulseless, had PEA asystole arrest, had CPR and ACLS, ROSC 5-10 minutes.  Patient was intubated.  Extubated on 3/19  Acute gout in feet Improving, elevated ESR and CRP, uric acid 5.4.   Continue colchicine and prednisone  Chronic obstructive pulmonary disease Stable Chest x-ray showed cardiomegaly  and perihilar opacities concerning for pulmonary edema  HIV Undetectable HIV RNA, CD4 430 Continue Tivicay and Descovy  Anemia of chronic disease HGB stable  Prostate cancer status post  brachytherapy     Code Status: Full  Family Communication: None at bedside  Disposition Plan: Plan for discharge on 03/09/18 after dialysis   Consultants:  Cardiology  PCCM  Nephrology  Procedures:  Intubated 3/17  Echocardiogram 3/18 EF 15-20%  RIJ tunneled catheter placement 3/18  Extubated 3/19  HD initiated 3/19  Heart catheterization 3/20, no culprit lesion  Antimicrobials:  None  DVT prophylaxis: Heparin   Objective: Vitals:   03/07/18 1812 03/07/18 2123 03/08/18 0548 03/08/18 0950  BP: (!) 119/92 116/71 105/73 122/80  Pulse: (!) 103 82 80 79  Resp: 18 16 16 15   Temp: 98 F (36.7 C) 97.7 F (36.5 C) 98.7 F (37.1 C) (!) 97.4 F (36.3 C)  TempSrc: Oral Oral Oral Oral  SpO2: (!) 89% 100% 98% 99%  Weight:  80 kg (176 lb 6.2 oz)    Height:        Intake/Output Summary (Last 24 hours) at 03/08/2018 1629 Last data filed at 03/08/2018 1401 Gross per 24 hour  Intake 480 ml  Output 0 ml  Net 480 ml   Filed Weights   03/07/18 1125 03/07/18 1637 03/07/18 2123  Weight: 81.3 kg (179 lb 3.7 oz) 80.1 kg (176 lb 9.4 oz) 80 kg (176 lb 6.2 oz)    Exam:   General: NAD  Cardiovascular: S1, S2 present  Respiratory: Chest clear to auscultation bilaterally  Abdomen: Soft, nontender, nondistended, bowel sounds present  Musculoskeletal: No pedal edema bilaterally  Skin: Normal  Psychiatry: Normal mood   Data Reviewed: CBC: Recent Labs  Lab 03/05/18 0621 03/06/18 0500 03/07/18 0507 03/07/18 1727 03/08/18 0745  WBC 13.3* 15.3* 13.8* 17.8* 14.7*  NEUTROABS  --   --  10.5*  --  11.4*  HGB 11.5* 10.6* 11.5* 13.9 12.8*  HCT 34.4* 33.3* 35.9* 41.9 39.5  MCV 98.6 97.7 98.6 100.5* 98.8  PLT 256 291 267 314 595   Basic Metabolic Panel: Recent Labs  Lab 03/02/18 0717  03/04/18 0021 03/05/18 0621 03/06/18 0500 03/07/18 0507 03/07/18 1130 03/08/18 0745  NA 139  --  134* 132* 131* 134* 135 132*  K 4.5  --  5.5* 5.1 5.0 6.4* 4.6 5.1  CL 95*   --  94* 91* 91* 96* 94* 91*  CO2 23  --  20* 22 21* 22 23 24   GLUCOSE 134*  --  118* 137* 108* 105* 89 107*  BUN 92*  --  81* 59* 89* 77* 81* 44*  CREATININE 11.79*  --  10.38* 8.04* 9.82* 8.17* 8.75* 6.75*  CALCIUM 9.3  --  8.7* 8.7* 8.7* 8.8* 9.4 9.4  MG 2.5*   < > 2.7* 2.4 2.5* 2.4  --  2.2  PHOS 11.3*  --   --  8.6*  --   --  6.3* 6.8*   < > = values in this interval not displayed.   GFR: Estimated Creatinine Clearance: 10.7 mL/min (A) (by C-G formula based on SCr of 6.75 mg/dL (H)). Liver Function Tests: Recent Labs  Lab 03/02/18 0717 03/05/18 0621 03/07/18 1130 03/08/18 0745  ALBUMIN 3.3* 3.1* 3.2* 3.7   No results for input(s): LIPASE, AMYLASE in the last 168 hours. No results for input(s): AMMONIA in the last 168 hours. Coagulation Profile: Recent Labs  Lab 03/04/18 0021 03/05/18 6387 03/06/18 0500 03/07/18 0507  03/08/18 0745  INR 1.14 1.36 1.59 1.44 1.51   Cardiac Enzymes: No results for input(s): CKTOTAL, CKMB, CKMBINDEX, TROPONINI in the last 168 hours. BNP (last 3 results) No results for input(s): PROBNP in the last 8760 hours. HbA1C: No results for input(s): HGBA1C in the last 72 hours. CBG: Recent Labs  Lab 03/03/18 0720 03/03/18 1149 03/03/18 1645 03/04/18 1151 03/07/18 0856  GLUCAP 113* 141* 218* 106* 129*   Lipid Profile: No results for input(s): CHOL, HDL, LDLCALC, TRIG, CHOLHDL, LDLDIRECT in the last 72 hours. Thyroid Function Tests: No results for input(s): TSH, T4TOTAL, FREET4, T3FREE, THYROIDAB in the last 72 hours. Anemia Panel: No results for input(s): VITAMINB12, FOLATE, FERRITIN, TIBC, IRON, RETICCTPCT in the last 72 hours. Urine analysis:    Component Value Date/Time   COLORURINE YELLOW 02/17/2018 0540   APPEARANCEUR CLOUDY (A) 02/17/2018 0540   LABSPEC 1.010 02/17/2018 0540   PHURINE 5.0 02/17/2018 0540   GLUCOSEU NEGATIVE 02/17/2018 0540   HGBUR LARGE (A) 02/17/2018 0540   BILIRUBINUR NEGATIVE 02/17/2018 0540   KETONESUR  NEGATIVE 02/17/2018 0540   PROTEINUR 100 (A) 02/17/2018 0540   NITRITE NEGATIVE 02/17/2018 0540   LEUKOCYTESUR NEGATIVE 02/17/2018 0540   Sepsis Labs: @LABRCNTIP (procalcitonin:4,lacticidven:4)  )No results found for this or any previous visit (from the past 240 hour(s)).    Studies: No results found.  Scheduled Meds: . amiodarone  200 mg Oral Daily  . Chlorhexidine Gluconate Cloth  6 each Topical Daily  . colchicine  0.6 mg Oral Daily  . dolutegravir  50 mg Oral Daily  . emtricitabine-tenofovir AF  1 tablet Oral Daily  . feeding supplement (NEPRO CARB STEADY)  237 mL Oral Q1400  . feeding supplement (PRO-STAT SUGAR FREE 64)  30 mL Oral BID  . lanthanum  1,000 mg Oral TID WC  . mouth rinse  15 mL Mouth Rinse BID  . midodrine  5 mg Oral BID WC  . scopolamine  1 patch Transdermal Q72H  . senna-docusate  1 tablet Oral BID  . warfarin  7.5 mg Oral ONCE-1800  . Warfarin - Pharmacist Dosing Inpatient   Does not apply q1800    Continuous Infusions: . heparin 1,200 Units/hr (03/07/18 2025)     LOS: 19 days     Alma Friendly, MD [facility]   If 7PM-7AM, please contact night-coverage [email or web address] Password [facility] 03/08/2018, 4:29 PM

## 2018-03-08 NOTE — Care Management Note (Addendum)
Case Management Note  Patient Details  Name: David [name] MRN: [id number] Date of Birth: 07-Jun-1941  Action/Plan: Hulen Skains PCP Johnell Comings office at [phone] spoke with Kathlee Nations, stated patient has two charts in Epic and the hospital admission notes are not merged with the existing chart;  NCM scheduled hospital follow appointment  (also will have coumadin check)  Expected Discharge Date:    03/08/2018              Expected Discharge Plan:  Home/Self Care  In-House Referral:  Clinical Social Work(transportation needs)  Discharge planning Services  CM Consult  Status of Service:  In process, will continue to follow  Additional Comments: Called Wife at [phone], Verified PCP listed with wife [name], RN  Nurse Case Manager [place] 03/08/2018, 10:05 AM

## 2018-03-08 NOTE — Progress Notes (Signed)
PT Cancellation Note  Patient Details Name: David [name] MRN: [id number] DOB: 09-May-1941   Cancelled Treatment:    Reason Eval/Treat Not Completed: Patient declined, no reason specified. Pt with several guests in room and declining mobility at this time. PT will continue to f/u with pt as available.    [place] 03/08/2018, 1:13 PM

## 2018-03-08 NOTE — Clinical Social Work Note (Signed)
Request made by Mr. Martine wife for [facility] transportation for patient to get to and from dialysis. SCAT application completed today and faxed to SC[facility] office and call made to SC[facility] office and message left for Ms. Johnson. CSW received call from [place] (4:16 pm) and was informed that patient has been certified for [place] transportation for 30 days and will be contacted regarding his certification appointment. Wife called, updated and advised that she can call today to make his appointment for Tuesday transport. She will also ask to determine if they must call each time or can set-up continuous transportation as he will need rides each Tuesday for HD. Wife expressed appreciation to CSW for assistance with patient's HD transportation. Visited patient's room and informed him of [employer] certification for SCAT services.  CSW signing off as HD transportation set-up, however please advise if any other SW intervention services needed prior to discharge on Saturday, 4/6.  Adan Baehr Givens, MSW, LCSW Licensed Clinical Social Worker [place] [phone]

## 2018-03-08 NOTE — Progress Notes (Addendum)
[place] for heparin/warfarin Indication: atrial fibrillation  No Known Allergies  Patient Measurements: Height: 6\' 2"  (188 cm) Weight: 176 lb 6.2 oz (80 kg) IBW/kg (Calculated) : 82.2 Heparin Dosing Weight: 83 kg   Vital Signs: Temp: 97.4 F (36.3 C) (04/05 0950) Temp Source: Oral (04/05 0950) BP: 122/80 (04/05 0950) Pulse Rate: 79 (04/05 0950)  Labs: Recent Labs    03/06/18 0500 03/07/18 0507 03/07/18 1130 03/07/18 1727 03/08/18 0745  HGB 10.6* 11.5*  --  13.9 12.8*  HCT 33.3* 35.9*  --  41.9 39.5  PLT 291 267  --  314 265  LABPROT 18.9* 17.5*  --   --  18.1*  INR 1.59 1.44  --   --  1.51  HEPARINUNFRC 0.53 0.54  --   --  0.34  CREATININE 9.82* 8.17* 8.75*  --  6.75*    Estimated Creatinine Clearance: 10.7 mL/min (A) (by C-G formula based on SCr of 6.75 mg/dL (H)).   Medications:  Infusions:  . heparin 1,200 Units/hr (03/07/18 2025)    Assessment: 34 YOM with PEA arrest, cath showing nonobstructive CAD,  found in Afib. Pharmacy consulted on 03/03/18 for heparin and warfarin dosing.  No anticoagulation PTA.  Heparin level remains therapeutic this morning at 0.34 on 1200 units/hr. INR remains subtherapeutic at 1.51 but trending back upward toward goal.   Hgb improved, low/stable , pltc wnl.  No bleeding noted.  Potential significant drug-drug interaction with amiodarone noted. Amiodarone loading dose decreased 4/4 to 200mg  daily. Amiodarone can increase coumadin effect. Pt in NSR.  Heparin to coumadin bridge continues.  Delay in therapeutic INR likely due to missed warfarin dose on 4/2 (pt vomited) and no amiodarone doses given on 4/3 that would have increased hypoprothrombinemic effect of coumadin.    Goal of Therapy:  INR 2-3 Heparin level 0.3-0.7 units/ml Monitor platelets by anticoagulation protocol: Yes   Plan:  Continue heparin drip at 1200 units/hr Warfarin 7.5 mg PO tonight Daily heparin level, INR,  CBC Monitor for s/sx of bleeding  Nicole Cella, RPh Clinical Pharmacist Clinical phone for 03/08/2018 until 4p is [phone] After 4p, please call Main Rx at [phone] for assistance 03/08/2018 11:11 AM   Addendum: Dr. Terence Lux wants to discharge pt today We agreed on discharge warfarin dose of 5mg  daily as safest dose for discharge on warfarin with amiodarone and INR check on Tuesday 03/10/18. I called the cardiology [facility] for Dr. Victorino December patients [phone].     INR appt made for Tue 4/9 2pm [email or web address]  [address] They prefer 3-4 INR visits to establish stable INR before setting up INR checks at dialysis center w/HD. Coumadin clinic will set this up when INR stable.   Nicole Cella, RPh Clinical Pharmacist Pager: [phone] 03/08/2018 11:59 AM

## 2018-03-08 NOTE — Progress Notes (Addendum)
[facility] Progress Note    Subjective:   No complaints   Objective:   BP 122/80 (BP Location: Right Arm)   Pulse 79   Temp (!) 97.4 F (36.3 C) (Oral)   Resp 15   Ht 6\' 2"  (1.88 m)   Wt 80 kg (176 lb 6.2 oz)   SpO2 99%   BMI 22.65 kg/m   Intake/Output: I/O last 3 completed shifts: In: 410 [P.O.:410] Out: 1500 [Other:1500]   Intake/Output this shift:  Total I/O In: 240 [P.O.:240] Out: 0  Weight change: -2 kg (-4 lb 6.6 oz)  Physical Exam: Gen: NAD CVS: no rub Resp:cta Abd: benign Ext: no edema, LUE AVG clotted  Labs: BMET Recent Labs  Lab 03/02/18 0717 03/04/18 0021 03/05/18 0621 03/06/18 0500 03/07/18 0507 03/07/18 1130 03/08/18 0745  NA 139 134* 132* 131* 134* 135 132*  K 4.5 5.5* 5.1 5.0 6.4* 4.6 5.1  CL 95* 94* 91* 91* 96* 94* 91*  CO2 23 20* 22 21* 22 23 24   GLUCOSE 134* 118* 137* 108* 105* 89 107*  BUN 92* 81* 59* 89* 77* 81* 44*  CREATININE 11.79* 10.38* 8.04* 9.82* 8.17* 8.75* 6.75*  ALBUMIN 3.3*  --  3.1*  --   --  3.2* 3.7  CALCIUM 9.3 8.7* 8.7* 8.7* 8.8* 9.4 9.4  PHOS 11.3*  --  8.6*  --   --  6.3* 6.8*   CBC Recent Labs  Lab 03/06/18 0500 03/07/18 0507 03/07/18 1727 03/08/18 0745  WBC 15.3* 13.8* 17.8* 14.7*  NEUTROABS  --  10.5*  --  11.4*  HGB 10.6* 11.5* 13.9 12.8*  HCT 33.3* 35.9* 41.9 39.5  MCV 97.7 98.6 100.5* 98.8  PLT 291 267 314 265    @IMGRELPRIORS @ Medications:    . amiodarone  200 mg Oral Daily  . Chlorhexidine Gluconate Cloth  6 each Topical Daily  . colchicine  0.6 mg Oral Daily  . dolutegravir  50 mg Oral Daily  . emtricitabine-tenofovir AF  1 tablet Oral Daily  . feeding supplement (NEPRO CARB STEADY)  237 mL Oral Q1400  . feeding supplement (PRO-STAT SUGAR FREE 64)  30 mL Oral BID  . mouth rinse  15 mL Mouth Rinse BID  . midodrine  5 mg Oral BID WC  . scopolamine  1 patch Transdermal Q72H  . senna-docusate  1 tablet Oral BID  . sevelamer carbonate  1.6 g Oral TID WC  . warfarin  7.5  mg Oral ONCE-1800  . Warfarin - Pharmacist Dosing Inpatient   Does not apply [id number]     Assessment/ Plan:   1. New onset paroxysmal A fib- on heparin and for coumadin 2. Hyperkalemia- unclear why it should be higher after HD yesterday.  Discussed with HD RN and catheter functioned well.  resolved with HD.  Floor RN reports compliance with renal diet. 3. 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 Currently on midodrine 4. ESRDBP borderline. Would consider PD as an alternative given his poor cardiac function and clotted AVG. Prognosis is poor given mounting end-stage disease processes.He is set up for TTS schedule at [facility] 11:45. Continue with TTS schedule. 5. 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 Nutrition:renal diet 8. Hypertension:low BP on midodrine  9. Vascular access- clotted AVG due to low cardiac output 10. Disposition- poor prognosis and is full code. Appreciate palliative care consult to help set goals/limits of care. I am not  sure his heart will be able to tolerate outpatient HD since he can't even keep an AVG patent. Might need to transition to PD if he remains aggressive with his care.INR not yet therapeutic. Will change to TTS schedule and is accepted at [facility] TTS second shift once stable for discharge. Pt cannot go to outpatient unit until Tuesday 03/12/18 so will plan on HD first shift tomorrow and then he can be discharged after HD.  [name], MD [place] Pager [phone] 03/08/2018, 1:02 PM

## 2018-03-09 LAB — RENAL FUNCTION PANEL
ALBUMIN: 3.6 g/dL (ref 3.5–5.0)
Anion gap: 22 — ABNORMAL HIGH (ref 5–15)
BUN: 69 mg/dL — ABNORMAL HIGH (ref 6–20)
CALCIUM: 9.5 mg/dL (ref 8.9–10.3)
CHLORIDE: 89 mmol/L — AB (ref 101–111)
CO2: 24 mmol/L (ref 22–32)
CREATININE: 9.15 mg/dL — AB (ref 0.61–1.24)
GFR, EST AFRICAN AMERICAN: 6 mL/min — AB (ref >60)
GFR, EST NON AFRICAN AMERICAN: 5 mL/min — AB (ref >60)
Glucose, Bld: 108 mg/dL — ABNORMAL HIGH (ref 65–99)
PHOSPHORUS: 8.7 mg/dL — AB (ref 2.5–4.6)
Potassium: 4.5 mmol/L (ref 3.5–5.1)
SODIUM: 135 mmol/L (ref 135–145)

## 2018-03-09 LAB — CBC WITH DIFFERENTIAL/PLATELET
BASOS ABS: 0 10*3/uL (ref 0.0–0.1)
BASOS PCT: 0 %
EOS PCT: 1 %
Eosinophils Absolute: 0.1 10*3/uL (ref 0.0–0.7)
HCT: 38.5 % — ABNORMAL LOW (ref 39.0–52.0)
Hemoglobin: 12.6 g/dL — ABNORMAL LOW (ref 13.0–17.0)
LYMPHS PCT: 18 %
Lymphs Abs: 3 10*3/uL (ref 0.7–4.0)
MCH: 32.1 pg (ref 26.0–34.0)
MCHC: 32.7 g/dL (ref 30.0–36.0)
MCV: 98.2 fL (ref 78.0–100.0)
MONO ABS: 1.5 10*3/uL — AB (ref 0.1–1.0)
Monocytes Relative: 9 %
Neutro Abs: 11.7 10*3/uL — ABNORMAL HIGH (ref 1.7–7.7)
Neutrophils Relative %: 72 %
PLATELETS: 272 10*3/uL (ref 150–400)
RBC: 3.92 MIL/uL — AB (ref 4.22–5.81)
RDW: 13.2 % (ref 11.5–15.5)
WBC: 16.2 10*3/uL — AB (ref 4.0–10.5)

## 2018-03-09 LAB — MAGNESIUM: MAGNESIUM: 2.3 mg/dL (ref 1.7–2.4)

## 2018-03-09 LAB — HEPARIN LEVEL (UNFRACTIONATED): Heparin Unfractionated: 0.7 IU/mL (ref 0.30–0.70)

## 2018-03-09 LAB — PROTIME-INR
INR: 2.05
PROTHROMBIN TIME: 22.9 s — AB (ref 11.4–15.2)

## 2018-03-09 MED ORDER — WARFARIN SODIUM 5 MG PO TABS: 5.0000 mg | ORAL_TABLET | Freq: Every day | ORAL | 0 refills | Status: DC

## 2018-03-09 MED ORDER — LANTHANUM CARBONATE 1000 MG PO CHEW: 1000.0000 mg | CHEWABLE_TABLET | Freq: Three times a day (TID) | ORAL | 0 refills | Status: DC

## 2018-03-09 MED ORDER — WARFARIN SODIUM 7.5 MG PO TABS: 7.5000 mg | ORAL_TABLET | Freq: Once | ORAL | Status: DC

## 2018-03-09 NOTE — Progress Notes (Signed)
David [name] for heparin/warfarin Indication: atrial fibrillation  No Known Allergies  Patient Measurements: Height: 6\' 2"  (188 cm) Weight: (!) 389 lb 1.8 oz (176.5 kg) IBW/kg (Calculated) : 82.2 Heparin Dosing Weight: 83 kg   Vital Signs: Temp: 98 F (36.7 C) (04/06 0534) Temp Source: Oral (04/06 0534) BP: 108/76 (04/06 0534) Pulse Rate: 80 (04/06 0534)  Labs: Recent Labs    03/07/18 0507 03/07/18 1130 03/07/18 1727 03/08/18 0745 03/09/18 0423  HGB 11.5*  --  13.9 12.8* 12.6*  HCT 35.9*  --  41.9 39.5 38.5*  PLT 267  --  314 265 272  LABPROT 17.5*  --   --  18.1* 22.9*  INR 1.44  --   --  1.51 2.05  HEPARINUNFRC 0.54  --   --  0.34 0.70  CREATININE 8.17* 8.75*  --  6.75* 9.15*    Estimated Creatinine Clearance: 11.8 mL/min (A) (by C-G formula based on SCr of 9.15 mg/dL (H)).  Assessment: 30 YOM s/p PEA arrest, cath showing nonobstructive CAD,  found in Afib. Pharmacy consulted on 03/03/18 for heparin and warfarin dosing.  No anticoagulation PTA.  Heparin level remains therapeutic this morning at 0.70 on 1200 units/hr. INR is now therapeutic at 2.05.  Hgb improved, low/stable, pltc wnl.  No bleeding noted.   Potential significant drug-drug interaction with amiodarone noted. Amiodarone loading dose decreased 4/4 to 200mg  daily. Amiodarone can increase coumadin effect.  Goal of Therapy:  INR 2-3 Heparin level 0.3-0.7 units/ml Monitor platelets by anticoagulation protocol: Yes   Plan:  Discontinue heparin drip as INR is now therapeutic Warfarin 7.5 mg PO tonight Daily INR, CBC if not discharged Monitor for s/sx of bleeding   Prior conversation with Dr. Horris Latino: We agreed on discharge warfarin dose of 5mg  daily as safest dose for discharge on warfarin with amiodarone and INR check on Tuesday 03/10/18. I called the cardiology [facility] for Dr. Victorino December patients [phone].   INR appt made for Tue 4/9 @ 2pm [facility]  [email or web address]  [address] They prefer 3-4 INR visits to establish stable INR before setting up INR checks at dialysis center w/HD. Coumadin clinic will set this up when INR stable.     Mishal Probert L. Kyung Rudd, PharmD, [place] PGY1 Pharmacy Resident Pager: [phone]

## 2018-03-09 NOTE — Discharge Summary (Signed)
Discharge Summary  Krish Bailly DZH:[id number] DOB: 1941/08/22  PCP: [name], NP  Admit date: 02/17/2018 Discharge date: 03/09/2018  Time spent: 35 mins  Recommendations for Outpatient Follow-up:  1. PCP 2. Cardiology  Discharge Diagnoses:  Active Hospital Problems   Diagnosis Date Noted  . HIV (human immunodeficiency virus infection) ([place])   . Palliative care by specialist   . Advance care planning   . Goals of care, counseling/discussion   . Acute respiratory failure with hypoxemia ([place])   . Cardiac arrest ([place]) 02/17/2018  . Respiratory arrest ([place])   . Acute pulmonary edema (H[facility])   . Acute on chronic systolic heart failure ([place])   . Chronic obstructive pulmonary disease ([place])   . Acute on chronic systolic and diastolic heart failure, NYHA class 4 ([place])   . ESRD (end stage renal disease) [facility])     Resolved Hospital Problems  No resolved problems to display.    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 On the morning of admission, around 2-3 am, patient woke up with severe shortness of breath. Upon arrival to ED patient was noted to be agonal and pulseless in parking lot. CPR started and continued for estimated 5-10 minutes before return of ROSC. Intubated. CXR consistent with pulmonary edema. Creatinine 5.32, LA 4.44. Patient was admitted by critical care service. TR[facility] assumed care.  Today, patient has no new complaints, denies  any chest pain, worsening shortness of breath, fever/chills.  Had a session of dialysis this morning, stable for discharge.  Advised patient not to miss any dialysis session.   Hospital Course:  Principal Problem:   HIV (human immunodeficiency virus infection) ([place]) Active Problems:   Respiratory arrest ([place])   Acute pulmonary edema (HCC)   Acute on chronic systolic heart failure (HCC)   Chronic obstructive pulmonary disease (HCC)   Cardiac arrest (HCC)   Acute on chronic systolic and diastolic heart failure, NYHA class 4 (HCC)   ESRD (end stage renal disease) ([place])   Acute respiratory failure with hypoxemia (HCC)   Palliative care by specialist   Advance care planning   Goals of care, counseling/discussion  ESRD newly initiated hemodialysis Creatinine 5.32 on admission, follows Dr. Posey Pronto in office. AV fistula placed on 3/25 Clipped to start on TTS schedule as outpt Palliative consulted due to long-term prognosis: Patient wants full scope of treatment, wants to be full code.  Palliative will follow as an outpatient  PEA, asystole arrest ROSC in 5-10mins, acute on chronic systolic and diastolic HF, nonischemic cardiomyopathy Patient had cardiac arrest with neurological recovery PCCM/Cardiology was consulted BNP 800, 2D echo showed EF of 15-20%, slightly down from the prior report of 20-25% Mildly elevated troponin likely secondary to demand ischemia and chest compressions. EKG showed left bundle branch block, unclear chronicity. Patient underwent cardiac cath on 3/20 which showed nonobstructive CAD and recommended continue medical therapy BP stable reduced Coreg dose to 3.125 mg  BID No lasix as on HD, no ACE indicated Not a candidate for ICD per cardiology  New onset Atrial fibrillation Currently rate controlled with sinus rhythm Cardiology on board: continue amiodarone 200 mg daily Patient also started on warfarin, anticoagulation clinic to monitor INR, appt  scheduled Continue low dose coreg as above  Acute respiratory failure with hypoxemia 2/2 acute pulmonary edema Resolved, on room air saturating well On arrival to ED, patient was noted to be agonal, pulseless, had PEA asystole arrest, had CPR and ACLS, ROSC 5-10 minutes. Patient was intubated.  Extubated on 3/19  Acute gout in feet Improving,elevated ESR and CRP, uric acid 5.4.  Continue colchicine and prednisone  Chronic obstructive pulmonary disease Stable Chest x-ray showed cardiomegaly and perihilar opacities concerning for pulmonary edema  HIV Undetectable HIV RNA, CD4 430 Continue Tivicay and Descovy  Anemia of chronic disease HGB stable  Prostate cancer status post brachytherapy    Procedures:  Intubated 3/17  Echocardiogram 3/18 EF 15-20%  RIJ tunneled catheter placement 3/18  Extubated 3/19  HD initiated 3/19  Heart catheterization 3/20, no culprit lesion  Consultations:  Cardiology  PCCM  Nephrology   Discharge Exam: BP 101/66 (BP Location: Right Arm)   Pulse 99   Temp 98.4 F (36.9 C) (Oral)   Resp 18   Ht 6' 2"  (1.88 m)   Wt 80.3 kg (177 lb 0.5 oz)   SpO2 96%   BMI 22.73 kg/m   General: NAD Cardiovascular: S1, S2 present Respiratory: Chest clear to auscultation bilaterally  Discharge Instructions You were cared for by a hospitalist during your hospital stay. If you have any questions about your discharge medications or the care you received while you were in the hospital after you are discharged, you can call the unit and asked to speak with the hospitalist on call if the hospitalist that took care of you is not available. Once you are discharged, your primary care physician will handle any further medical issues. Please note that NO REFILLS for any discharge medications will be authorized once you are discharged, as it is imperative that you return to your primary care physician (or establish a relationship with a primary care  physician if you do not have one) for your aftercare needs so that they can reassess your need for medications and monitor your lab values.   Allergies as of 03/09/2018   No Known Allergies     Medication List    STOP taking these medications   amLODipine 10 MG tablet Commonly known as:  NORVASC   furosemide 80 MG tablet Commonly known as:  LASIX   potassium chloride 10 MEQ tablet Commonly known as:  K-DUR     TAKE these medications   allopurinol 100 MG tablet Commonly known as:  ZYLOPRIM Take 100 mg by mouth every morning.   amiodarone 200 MG tablet Commonly known as:  PACERONE Take 1 tablet (200 mg total) by mouth daily.   aspirin EC 81 MG tablet Take 81 mg by mouth daily.   brimonidine 0.15 % ophthalmic solution Commonly known as:  ALPHAGAN Place 1 drop into the left eye 2 (two) times daily.   carvedilol 3.125 MG tablet Commonly known as:  COREG Take 1 tablet (3.125 mg total) by mouth 2 (two) times daily. What changed:    medication strength  how much to take   colchicine 0.6 MG tablet Take 0.6 mg by mouth See admin instructions. Taker 1 tablet (0.27m) once daily for 7 days just when  having flare up.   DESCOVY 200-25 MG tablet Generic drug:  emtricitabine-tenofovir AF Take 1 tablet by mouth every morning.   dorzolamide-timolol 22.3-6.8 MG/ML ophthalmic solution Commonly known as:  COSOPT Place 1 drop into the left eye 2 (two) times daily.   lanthanum 1000 MG chewable tablet Commonly known as:  FOSRENOL Chew 1 tablet (1,000 mg total) by mouth 3 (three) times daily with meals.   latanoprost 0.005 % ophthalmic solution Commonly known as:  XALATAN Place 1 drop into the left eye at bedtime.   midodrine 5 MG tablet Commonly known as:  PROAMATINE Take 1 tablet (5 mg total) by mouth 2 (two) times daily with a meal.   polyethylene glycol packet Commonly known as:  MIRALAX / GLYCOLAX Take 17 g by mouth daily as needed for mild constipation.   scopolamine  1 MG/3DAYS Commonly known as:  TRANSDERM-SCOP Place 1 patch (1.5 mg total) onto the skin every 3 (three) days. Start taking on:  03/10/2018   senna-docusate 8.6-50 MG tablet Commonly known as:  Senokot-S Take 1 tablet by mouth 2 (two) times daily.   sevelamer carbonate 0.8 g Pack packet Commonly known as:  RENVELA Take 1.6 g by mouth 3 (three) times daily with meals.   sulfamethoxazole-trimethoprim 800-160 MG tablet Commonly known as:  BACTRIM DS,SEPTRA DS Take 1 tablet by mouth 3 (three) times a week.   terazosin 10 MG capsule Commonly known as:  HYTRIN Take 10 mg by mouth at bedtime.   TIVICAY 50 MG tablet Generic drug:  dolutegravir Take 50 mg by mouth every morning.   Vitamin D (Ergocalciferol) 50000 units Caps capsule Commonly known as:  DRISDOL Take 50,000 Units by mouth once a week.   warfarin 5 MG tablet Commonly known as:  COUMADIN Take 1 tablet (5 mg total) by mouth daily at 6 PM.      No Known Allergies Follow-up Information    [facility] Follow up.   Why:  Tuesday,Thursday , Saturday   Contact information: [address],  [place], [place] [address]         [name], NP. Go on 03/12/2018.   Specialty:  Internal Medicine Why:  Hospital Follow Up to include Lab work-Arrive at 9:40 am Contact information: [place] [address] [phone]        [name], MD. Go on 06/10/2018.   Specialty:  Cardiology Why:  Please arrive 15 minutes early for your 10:20am appointment Contact information: [address] [place] [address] [place]. Go on 03/10/2018.   Specialty:  Cardiology Why:  INR appt made for Tue 4/7 2pm [facility] Contact information: [address] [place] [phone]           The results of significant diagnostics from this hospitalization (including imaging,  microbiology, ancillary and laboratory) are listed below for reference.    Significant Diagnostic Studies: Ct Head Wo Contrast  Result Date: 02/17/2018 CLINICAL DATA:  Altered level of consciousness.  Found unresponsive. EXAM: CT HEAD WITHOUT CONTRAST TECHNIQUE: Contiguous axial images were obtained from the base of the skull through the vertex without intravenous contrast. COMPARISON:  Head CT 07/31/2017, brain MRI 11/01/2017 FINDINGS: Brain: Generalized atrophy and chronic small vessel ischemia, similar to prior exams. No intracranial hemorrhage, mass effect, or midline shift. No hydrocephalus. The basilar cisterns are patent. No evidence of territorial infarct or acute  ischemia. No evidence of cerebral edema. No extra-axial or intracranial fluid collection. Vascular: No hyperdense vessel or unexpected calcification. Skull: No fracture or focal lesion. Sinuses/Orbits: Metallic density in the right globe anterior chamber is unchanged from prior exam. Bilateral cataract resection. Minimal metallic debris in the anterior scalp. Mild mucosal thickening of the ethmoid air cells and sphenoid sinus. Mastoid air cells are clear. Other: None. IMPRESSION: 1.  No acute intracranial abnormality. 2. Generalized atrophy and chronic small vessel ischemia. 3. Metallic density in the right globe, chronic and unchanged from prior exam. Electronically Signed   By: Jeb Levering M.D.   On: 02/17/2018 05:51   Ct Chest Wo Contrast  Result Date: 02/17/2018 CLINICAL DATA:  Found unresponsive. Post CPR. Acute resp illness, > 86 years old EXAM: CT CHEST WITHOUT CONTRAST TECHNIQUE: Multidetector CT imaging of the chest was performed following the standard protocol without IV contrast. COMPARISON:  Chest radiograph earlier this day. Additional radiographs and CT in a different patient jacket, most recent CT 11/25/2017 FINDINGS: Cardiovascular: Aortic atherosclerosis. Coronary artery calcifications. Mild multi chamber  cardiomegaly. Minimal pericardial fluid. Mediastinum/Nodes: Scattered mediastinal nodes not enlarged by size criteria. Lack of IV contrast limits assessment for hilar adenopathy. Endotracheal and enteric tubes appropriately positioned. Lungs/Pleura: Multifocal confluent, ground-glass, patchy and nodular opacities throughout both lungs. Consolidations in the dependent lungs appear confluent, and there is debris within the distal trachea and right mainstem bronchi, suggesting aspiration. No definite septal thickening. No pleural fluid. Upper Abdomen: Tip and side port of the enteric tube in the stomach. Bilateral renal atrophy with hyperdense lesions in both kidneys, likely cysts but incompletely characterized. Musculoskeletal: No acute finding. There are no acute or suspicious osseous abnormalities. IMPRESSION: 1. Multifocal bilateral consolidative, ground-glass, nodular airspace opacities throughout both lungs. Minimal debris in the distal trachea and right mainstem bronchus. Overall findings suggest pneumonia with probable aspiration. Degree of pulmonary edema is also considered. Ground-glass opacities in a patient with HIV can be seen with pneumocystis pneumonia , however consolidative opacities are the prominent pattern. 2. Cardiomegaly with aortic atherosclerosis. Aortic Atherosclerosis (ICD10-I70.0). Electronically Signed   By: Jeb Levering M.D.   On: 02/17/2018 06:02   Ir Fluoro Guide Cv Line Right  Result Date: 02/18/2018 CLINICAL DATA:  End-stage renal disease, needs durable venous access for hemodialysis EXAM: TUNNELED HEMODIALYSIS CATHETER PLACEMENT WITH ULTRASOUND AND FLUOROSCOPIC GUIDANCE TECHNIQUE: The procedure, risks, benefits, and alternatives were explained to the spouse. Questions regarding the procedure were encouraged and answered. The spouse understands and consents to the procedure. As antibiotic prophylaxis, cefazolin 2 g was ordered pre-procedure and administered intravenously within  one hour of incision.Patency of the right IJ vein was confirmed with ultrasound with image documentation. An appropriate skin site was determined. Region was prepped using maximum barrier technique including cap and mask, sterile gown, sterile gloves, large sterile sheet, and Chlorhexidine as cutaneous antisepsis. The region was infiltrated locally with 1% lidocaine. Intravenous Fentanyl and Versed were administered as conscious sedation during continuous monitoring of the patient's level of consciousness and physiological / cardiorespiratory status by the radiology RN, with a total moderate sedation time of 10 minutes. Under real-time ultrasound guidance, the right IJ vein was accessed with a 21 gauge micropuncture needle; the needle tip within the vein was confirmed with ultrasound image documentation. Needle exchanged over the 018 guidewire for transitional dilator, which allowed advancement of a Benson wire into the IVC. Over this, an MPA catheter was advanced. A Palindrome 23 hemodialysis catheter was tunneled from the right anterior  chest wall approach to the right IJ dermatotomy site. The MPA catheter was exchanged over an Amplatz wire for serial vascular dilators which allow placement of a peel-away sheath, through which the catheter was advanced under intermittent fluoroscopy, positioned with its tips in the proximal and midright atrium. Spot chest radiograph confirms good catheter position. No pneumothorax. Catheter was flushed and primed per protocol. Catheter secured externally with O Prolene sutures. The right IJ dermatotomy site was closed with Dermabond. COMPLICATIONS: COMPLICATIONS None immediate FLUOROSCOPY TIME:  1 minutes; 9 mGy COMPARISON:  None IMPRESSION: 1. Technically successful placement of tunneled right IJ hemodialysis catheter with ultrasound and fluoroscopic guidance. Ready for routine use. ACCESS: Remains approachable for percutaneous intervention as needed. Electronically Signed   By:  Lucrezia Europe M.D.   On: 02/18/2018 16:39   Ir US Guide Vasc Access Right  Result Date: 02/18/2018 CLINICAL DATA:  End-stage renal disease, needs durable venous access for hemodialysis EXAM: TUNNELED HEMODIALYSIS CATHETER PLACEMENT WITH ULTRASOUND AND FLUOROSCOPIC GUIDANCE TECHNIQUE: The procedure, risks, benefits, and alternatives were explained to the spouse. Questions regarding the procedure were encouraged and answered. The spouse understands and consents to the procedure. As antibiotic prophylaxis, cefazolin 2 g was ordered pre-procedure and administered intravenously within one hour of incision.Patency of the right IJ vein was confirmed with ultrasound with image documentation. An appropriate skin site was determined. Region was prepped using maximum barrier technique including cap and mask, sterile gown, sterile gloves, large sterile sheet, and Chlorhexidine as cutaneous antisepsis. The region was infiltrated locally with 1% lidocaine. Intravenous Fentanyl and Versed were administered as conscious sedation during continuous monitoring of the patient's level of consciousness and physiological / cardiorespiratory status by the radiology RN, with a total moderate sedation time of 10 minutes. Under real-time ultrasound guidance, the right IJ vein was accessed with a 21 gauge micropuncture needle; the needle tip within the vein was confirmed with ultrasound image documentation. Needle exchanged over the 018 guidewire for transitional dilator, which allowed advancement of a Benson wire into the IVC. Over this, an MPA catheter was advanced. A Palindrome 23 hemodialysis catheter was tunneled from the right anterior chest wall approach to the right IJ dermatotomy site. The MPA catheter was exchanged over an Amplatz wire for serial vascular dilators which allow placement of a peel-away sheath, through which the catheter was advanced under intermittent fluoroscopy, positioned with its tips in the proximal and midright  atrium. Spot chest radiograph confirms good catheter position. No pneumothorax. Catheter was flushed and primed per protocol. Catheter secured externally with O Prolene sutures. The right IJ dermatotomy site was closed with Dermabond. COMPLICATIONS: COMPLICATIONS None immediate FLUOROSCOPY TIME:  1 minutes; 9 mGy COMPARISON:  None IMPRESSION: 1. Technically successful placement of tunneled right IJ hemodialysis catheter with ultrasound and fluoroscopic guidance. Ready for routine use. ACCESS: Remains approachable for percutaneous intervention as needed. Electronically Signed   By: Lucrezia Europe M.D.   On: 02/18/2018 16:39   Dg Chest Port 1 View  Result Date: 02/19/2018 CLINICAL DATA:  Acute on chronic CHF, COPD, respiratory arrest and cardiac arrest. End-stage renal disease, HIV. EXAM: PORTABLE CHEST 1 VIEW COMPARISON:  Chest x-ray of February 18, 2018 FINDINGS: There has been interval placement of a dual-lumen dialysis catheter whose tip projects at the right cavoatrial junction. The lungs are well-expanded. The interstitial markings are slightly less conspicuous overall today. There is no significant pleural effusion. The cardiac silhouette is enlarged. The pulmonary vascularity is not engorged. The endotracheal tube tip lies 5.9 cm  above the carina. The esophagogastric tube tip in proximal port project below the GE junction. IMPRESSION: Interval placement of a dialysis catheter via the right internal jugular approach without postprocedure complication. Stable cardiomegaly with mild improvement in interstitial edema. The support tubes are in reasonable position. Electronically Signed   By: David  Martinique M.D.   On: 02/19/2018 07:31   Dg Chest Port 1 View  Result Date: 02/18/2018 CLINICAL DATA:  Acute respiratory failure, hypoxia, intubated patient. History of CHF, renal insufficiency, HIV, and prostate malignancy. EXAM: PORTABLE CHEST 1 VIEW COMPARISON:  Chest x-ray and chest CT scan of February 17, 2018 FINDINGS:  The lungs are adequately inflated. The interstitial markings remain increased and are more conspicuous today on the left. The cardiac silhouette remains enlarged. The pulmonary vascularity is not engorged. The endotracheal tube tip lies 6.4 cm above the carina. The esophagogastric tube tip and proximal port project below the GE junction. IMPRESSION: Persistent bilateral pneumonia. Slight increased opacity noted on the left today. No overt pulmonary edema. The support tubes are in reasonable position where visualized. Electronically Signed   By: David  Martinique M.D.   On: 02/18/2018 08:35   Dg Chest Portable 1 View  Result Date: 02/17/2018 CLINICAL DATA:  Shortness of breath. Intubation and orogastric tube placement. EXAM: PORTABLE CHEST 1 VIEW COMPARISON:  None. FINDINGS: Endotracheal tube 7.4 cm from the carina. Enteric tube in place, tip visualized in the stomach. The heart is enlarged. There are bilateral ill-defined perihilar opacities. Minimal Kerley B-lines in the periphery of the right lung. No large pleural effusion or evidence of pneumothorax. Lung apices not included in the field of view. IMPRESSION: 1. Endotracheal tube 7.4 cm from the carina. Enteric tube in place with tip visualized in the stomach. 2. Cardiomegaly. Bilateral ill-defined perihilar opacities, favoring pulmonary edema. Multifocal pneumonia or aspiration could have a similar appearance. Electronically Signed   By: Jeb Levering M.D.   On: 02/17/2018 03:45    Microbiology: No results found for this or any previous visit (from the past 240 hour(s)).   Labs: Basic Metabolic Panel: Recent Labs  Lab 03/05/18 0621 03/06/18 0500 03/07/18 0507 03/07/18 1130 03/08/18 0745 03/09/18 0423  NA 132* 131* 134* 135 132* 135  K 5.1 5.0 6.4* 4.6 5.1 4.5  CL 91* 91* 96* 94* 91* 89*  CO2 22 21* 22 23 24 24   GLUCOSE 137* 108* 105* 89 107* 108*  BUN 59* 89* 77* 81* 44* 69*  CREATININE 8.04* 9.82* 8.17* 8.75* 6.75* 9.15*  CALCIUM 8.7*  8.7* 8.8* 9.4 9.4 9.5  MG 2.4 2.5* 2.4  --  2.2 2.3  PHOS 8.6*  --   --  6.3* 6.8* 8.7*   Liver Function Tests: Recent Labs  Lab 03/05/18 0621 03/07/18 1130 03/08/18 0745 03/09/18 0423  ALBUMIN 3.1* 3.2* 3.7 3.6   No results for input(s): LIPASE, AMYLASE in the last 168 hours. No results for input(s): AMMONIA in the last 168 hours. CBC: Recent Labs  Lab 03/06/18 0500 03/07/18 0507 03/07/18 1727 03/08/18 0745 03/09/18 0423  WBC 15.3* 13.8* 17.8* 14.7* 16.2*  NEUTROABS  --  10.5*  --  11.4* 11.7*  HGB 10.6* 11.5* 13.9 12.8* 12.6*  HCT 33.3* 35.9* 41.9 39.5 38.5*  MCV 97.7 98.6 100.5* 98.8 98.2  PLT 291 267 314 265 272   Cardiac Enzymes: No results for input(s): CKTOTAL, CKMB, CKMBINDEX, TROPONINI in the last 168 hours. BNP: BNP (last 3 results) Recent Labs    02/17/18 0300  BNP 800.9*  ProBNP (last 3 results) No results for input(s): PROBNP in the last 8760 hours.  CBG: Recent Labs  Lab 03/03/18 0720 03/03/18 1149 03/03/18 1645 03/04/18 1151 03/07/18 0856  GLUCAP 113* 141* 218* 106* 129*       Signed:  Alma Friendly, MD [facility] 03/09/2018, 5:18 PM

## 2018-03-09 NOTE — Progress Notes (Signed)
I was present at this dialysis session. I have reviewed the session itself and made appropriate changes.  Will be discharged today after HD.  Filed Weights   03/07/18 1637 03/07/18 2123 03/09/18 0534  Weight: 80.1 kg (176 lb 9.4 oz) 80 kg (176 lb 6.2 oz) (!) 176.5 kg (389 lb 1.8 oz)    Recent Labs  Lab 03/09/18 0423  NA 135  K 4.5  CL 89*  CO2 24  GLUCOSE 108*  BUN 69*  CREATININE 9.15*  CALCIUM 9.5  PHOS 8.7*    Recent Labs  Lab 03/07/18 0507 03/07/18 1727 03/08/18 0745 03/09/18 0423  WBC 13.8* 17.8* 14.7* 16.2*  NEUTROABS 10.5*  --  11.4* 11.7*  HGB 11.5* 13.9 12.8* 12.6*  HCT 35.9* 41.9 39.5 38.5*  MCV 98.6 100.5* 98.8 98.2  PLT 267 314 265 272    Scheduled Meds: . amiodarone  200 mg Oral Daily  . Chlorhexidine Gluconate Cloth  6 each Topical Daily  . colchicine  0.6 mg Oral Daily  . dolutegravir  50 mg Oral Daily  . emtricitabine-tenofovir AF  1 tablet Oral Daily  . feeding supplement (NEPRO CARB STEADY)  237 mL Oral Q1400  . feeding supplement (PRO-STAT SUGAR FREE 64)  30 mL Oral BID  . lanthanum  1,000 mg Oral TID WC  . mouth rinse  15 mL Mouth Rinse BID  . midodrine  5 mg Oral BID WC  . scopolamine  1 patch Transdermal Q72H  . senna-docusate  1 tablet Oral BID  . warfarin  7.5 mg Oral ONCE-1800  . Warfarin - Pharmacist Dosing Inpatient   Does not apply q1800   Continuous Infusions: PRN Meds:.heparin, oxyCODONE-acetaminophen, polyethylene glycol    Assessment/Plan: 1. New onset paroxysmal A fib- on heparin and for coumadin 2. Hyperkalemia- unclear why it should be higher after HD yesterday. Discussed with HD RN and catheter functioned well. resolved with HD. Floor RN reports compliance with renal diet. 3. 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 Currently on midodrine 4. ESRDBP borderline. Would consider PD as an alternative given his poor cardiac function and clotted AVG. Prognosis is  poor given mounting end-stage disease processes.He is set up for TTS schedule at [facility] 11:45. Continue withTTS schedule. 1. 2K/2.25Ca, 4 hours, bfr400, dfr 800, linear sodium 148, edw 78.5kg, std heparin 5. Anemia:no ESA for now Hgb 11.3 6. CKD-MBD:couldn't tolerate renvela so started on lanthanum 1 gram qac tid 7. Nutrition:renal diet 8. Hypertension:low BP on midodrine  9. Vascular access- clotted AVG due to low cardiac output 10. Disposition- poor prognosis and is full code. Appreciate palliative care consult to help set goals/limits of care. I am not sure his heart will be able to tolerate outpatient HD since he can't even keep an AVG patent. Might need to transition to PD if he remains aggressive with his care.INR not yet therapeutic. Will change to TTS schedule and is accepted at [facility] TTS second shift.  Will be discharged after HD today.   David Potts,  MD 03/09/2018, 8:50 AM

## 2018-03-10 NOTE — Progress Notes (Signed)
Late entry Patient discharged to home on 4/6. Avs reviewed with patient and family. IV removed, patient left floor via wheelchair with staff memeber

## 2018-03-11 ENCOUNTER — Ambulatory Visit (INDEPENDENT_AMBULATORY_CARE_PROVIDER_SITE_OTHER): Payer: Medicare HMO | Admitting: Medical

## 2018-03-11 ENCOUNTER — Telehealth: Payer: Self-pay | Admitting: *Deleted

## 2018-03-11 ENCOUNTER — Encounter: Payer: Self-pay | Admitting: Family

## 2018-03-11 ENCOUNTER — Encounter: Payer: Self-pay | Admitting: Medical

## 2018-03-11 ENCOUNTER — Ambulatory Visit (HOSPITAL_BASED_OUTPATIENT_CLINIC_OR_DEPARTMENT_OTHER)
Admission: RE | Admit: 2018-03-11 | Discharge: 2018-03-11 | Disposition: A | Discharge status: Home / Self Care | Payer: Medicare HMO | Source: Ambulatory Visit | Attending: Medical | Admitting: Medical

## 2018-03-11 VITALS — BP 100/67 | HR 83 | Temp 98.2°F | Resp 16 | Ht 74.0 in | Wt 179.2 lb

## 2018-03-11 DIAGNOSIS — K5900 Constipation, unspecified: Secondary | ICD-10-CM | POA: Diagnosis not present

## 2018-03-11 DIAGNOSIS — D649 Anemia, unspecified: Secondary | ICD-10-CM | POA: Diagnosis not present

## 2018-03-11 DIAGNOSIS — N189 Chronic kidney disease, unspecified: Secondary | ICD-10-CM

## 2018-03-11 DIAGNOSIS — R112 Nausea with vomiting, unspecified: Secondary | ICD-10-CM

## 2018-03-11 DIAGNOSIS — I1311 Hypertensive heart and chronic kidney disease without heart failure, with stage 5 chronic kidney disease, or end stage renal disease: Secondary | ICD-10-CM

## 2018-03-11 DIAGNOSIS — R0600 Dyspnea, unspecified: Secondary | ICD-10-CM

## 2018-03-11 LAB — CBC WITH DIFFERENTIAL/PLATELET
BASOS PCT: 0.5 % (ref 0.0–3.0)
Basophils Absolute: 0.1 10*3/uL (ref 0.0–0.1)
EOS ABS: 0.1 10*3/uL (ref 0.0–0.7)
Eosinophils Relative: 1.2 % (ref 0.0–5.0)
HCT: 39.1 % (ref 39.0–52.0)
HEMOGLOBIN: 13 g/dL (ref 13.0–17.0)
Lymphocytes Relative: 20.3 % (ref 12.0–46.0)
Lymphs Abs: 2 10*3/uL (ref 0.7–4.0)
MCHC: 33.2 g/dL (ref 30.0–36.0)
MCV: 99 fl (ref 78.0–100.0)
MONO ABS: 0.9 10*3/uL (ref 0.1–1.0)
Monocytes Relative: 9.3 % (ref 3.0–12.0)
Neutro Abs: 6.8 10*3/uL (ref 1.4–7.7)
Neutrophils Relative %: 68.7 % (ref 43.0–77.0)
PLATELETS: 305 10*3/uL (ref 150.0–400.0)
RBC: 3.95 Mil/uL — ABNORMAL LOW (ref 4.22–5.81)
RDW: 13.7 % (ref 11.5–15.5)
WBC: 10 10*3/uL (ref 4.0–10.5)

## 2018-03-11 LAB — COMPREHENSIVE METABOLIC PANEL
ALBUMIN: 4 g/dL (ref 3.5–5.2)
ALT: 160 U/L — AB (ref 0–53)
AST: 79 U/L — AB (ref 0–37)
Alkaline Phosphatase: 137 U/L — ABNORMAL HIGH (ref 39–117)
BILIRUBIN TOTAL: 0.5 mg/dL (ref 0.2–1.2)
BUN: 75 mg/dL — ABNORMAL HIGH (ref 6–23)
CHLORIDE: 94 meq/L — AB (ref 96–112)
CO2: 22 mEq/L (ref 19–32)
CREATININE: 11.61 mg/dL — AB (ref 0.40–1.50)
Calcium: 9.3 mg/dL (ref 8.4–10.5)
GFR: 5.52 mL/min — CL (ref >60.00)
Glucose, Bld: 132 mg/dL — ABNORMAL HIGH (ref 70–99)
Potassium: 5 mEq/L (ref 3.5–5.1)
SODIUM: 135 meq/L (ref 135–145)
Total Protein: 7.9 g/dL (ref 6.0–8.3)

## 2018-03-11 LAB — BRAIN NATRIURETIC PEPTIDE: PRO B NATRI PEPTIDE: 230 pg/mL — AB (ref 0.0–100.0)

## 2018-03-11 MED ORDER — HYDROCORTISONE ACETATE 25 MG RE SUPP: 25.0000 mg | Freq: Two times a day (BID) | RECTAL | 0 refills | Status: DC

## 2018-03-11 MED ORDER — ONDANSETRON 8 MG PO TBDP: 8.0000 mg | ORAL_TABLET | Freq: Three times a day (TID) | ORAL | 0 refills | Status: DC | PRN

## 2018-03-11 NOTE — Telephone Encounter (Signed)
Spoke with pt's wife. States pt was in [facility] (information is in a duplicate pt acct).  Advised her that pt should be evaluated further in the office today. PCP has no openings, appt scheduled with PA, Saguier for 2pm today.  Copied from [place]. Topic: General - Other >> Mar 11, 2018  7:51 AM Yvette Rack wrote: Reason for CRM: patient [name] would like something called into her husbands pharmacy [facility] [place] for vomiting every time he eats he vomit this has been going on since hes been out of hospital since Saturday he has a Hospital f/u on 03-26-18 with Inda Castle

## 2018-03-11 NOTE — Telephone Encounter (Signed)
CRITICAL VALUE STICKER  CRITICAL VALUE:Creat:11.61, GFR:5.52  RECEIVER (on-site recipient of call):Angie  DATE & TIME NOTIFIED:03/11/18 @ 4:54pm   MESSENGER (representative from lab):Hope @ [place]  MD NOTIFIED:Edward Saguier,PA-C   TIME OF NOTIFICATION:4:57pm  RESPONSE:Edward stated that he had already seen the results.  But asked for note to be sent to him.

## 2018-03-11 NOTE — Telephone Encounter (Signed)
Aware of patient's creatinine and GFR results.  He just started dialysis last week and is scheduled for dialysis again tomorrow.

## 2018-03-11 NOTE — Progress Notes (Signed)
Subjective    Patient ID: David [name], male    DOB: Mar 25, 1941, 77 y.o.   MRN: [id number]  HPI  Pt in for follow up. He had some new onset atrial fibrillation, acute respiratory failure, and had PEA. Pt has history of of HIV, and ESRD. Last time pt had dialysis was on Saturday.   Admitted on 02/17/2018.  Dialysis schedule Tuesday, Thursday, and Saturday.  Pt just started dialysis for one week.   Pt was discharged on 03-09-2018.  Pt also states over last week even when in hospital had small hard stools. Before he was hospitalized had larger bm every day. About one week ago had bm after laxative.   Wife states upcoming appointment with cardiologist and nephrologist.  Pt states he has had vomiting almost with every meal. Pt had these issues when in hospital. He will only tolerate particularly foods. Pt states he is passing some small hard stools last 2 days after discharge from the hospital.  On February 18, 2018. Xray report listed possible pneumonia. Then  DC summary just states chf.  Dc summary reads.  Recommendations for Outpatient Follow-up:  1. PCP 2. Cardiology  Discharge Diagnoses:      Active Hospital Problems   Diagnosis Date Noted  . HIV (human immunodeficiency virus infection) ([place])   . Palliative care by specialist   . Advance care planning   . Goals of care, counseling/discussion   . Acute respiratory failure with hypoxemia ([place])   . Cardiac arrest ([place]) 02/17/2018  . Respiratory arrest ([place])   . Acute pulmonary edema (HCC)   . Acute on chronic systolic heart failure ([place])   . Chronic obstructive pulmonary disease ([place])   . Acute on chronic systolic and diastolic heart failure, NYHA class 4 ([place])   . ESRD (end stage renal disease) [facility])     Resolved Hospital Problems  No resolved problems to display.    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 On the morning of admission, around 2-3 am, patient woke up with severe shortness of breath. Upon arrival to ED patient was noted to be agonal and pulseless in parking lot. CPR started and continued for estimated 5-10 minutes before return of ROSC. Intubated. CXR consistent with pulmonary edema. Creatinine 5.32, LA 4.44. Patient was admitted by critical care service. TRH assumed care.  Today, patient has no new complaints, denies any chest pain, worsening shortness of breath, fever/chills.  Had a session of dialysis this morning, stable for discharge.  Advised patient not to miss any dialysis session.   Hospital Course:  Principal Problem:   HIV (human immunodeficiency virus infection) ([place]) Active Problems:   Respiratory arrest ([place])   Acute pulmonary edema (HCC)   Acute on chronic systolic heart failure (HCC)   Chronic obstructive pulmonary disease (HCC)   Cardiac arrest (HCC)   Acute on chronic systolic and diastolic heart failure, NYHA  class 4 (HCC)   ESRD (end stage renal disease) ([place])   Acute respiratory failure with hypoxemia [facility])   Palliative care by specialist   Advance care planning   Goals of care, counseling/discussion  ESRD newly initiated hemodialysis Creatinine 5.32 on admission, follows Dr. Posey Pronto in office. AV fistula placed on 3/25 Clipped to start on TTS schedule as outpt Palliative consulted due to long-term prognosis: Patient wants full scope of treatment, wants to be full code. Palliative will follow as an  outpatient  PEA, asystole arrest ROSC in 5-10mins, acute on chronic systolic and diastolic HF, nonischemic cardiomyopathy Patient had cardiac arrest with neurological recovery PCCM/Cardiology was consulted BNP 800, 2D echo showed EF of 15-20%, slightly down from the prior report of 20-25% Mildly elevated troponin likely secondary to demand ischemia and chest compressions. EKG showed left bundle branch block, unclear chronicity. Patient underwent cardiac cath on 3/20 which showed nonobstructive CAD and recommended continue medical therapy BP stable reduced Coreg dose to 3.125 mg  BID No lasix as on HD, no ACE indicated Not a candidate for ICD per cardiology  New onset Atrial fibrillation Currently rate controlled with sinus rhythm Cardiology on board: continue amiodarone 200 mg daily Patient also started on warfarin, anticoagulation clinic to monitor INR, appt scheduled Continue low dose coreg as above  Acute respiratory failure with hypoxemia 2/2 acute pulmonary edema Resolved, on room air saturating well On arrival to ED, patient was noted to be agonal, pulseless, had PEA asystole arrest, had CPR and ACLS, ROSC 5-10 minutes. Patient was intubated.  Extubated on 3/19  Acute gout in feet Improving,elevated ESR and CRP, uric acid 5.4.  Continue colchicine and prednisone  Chronic obstructive pulmonary disease Stable Chest x-ray showed cardiomegaly and perihilar opacities concerning for pulmonary edema  HIV Undetectable HIV RNA, CD4 430 Continue Tivicay and Descovy  Anemia of chronic disease HGB stable       Review of Systems  Constitutional: Negative for chills, fatigue and fever.  Respiratory: Negative for chest tightness, shortness of breath and wheezing.   Cardiovascular: Negative for chest pain and palpitations.       Objective:   Physical Exam  General Mental Status- Alert. General Appearance- Not in acute distress.   Skin General: Color- Normal  Color. Moisture- Normal Moisture.  Neck Carotid Arteries- Normal color. Moisture- Normal Moisture. No carotid bruits. No JVD.  Chest and Lung Exam Auscultation: Breath Sounds:-Normal.  Cardiovascular Auscultation:Rythm- Regular. Murmurs & Other Heart Sounds:Auscultation of the heart reveals- No Murmurs.  Abdomen Inspection:-Inspeection Normal. Palpation/Percussion:Note:No mass. Palpation and Percussion of the abdomen reveal- Non Tender, Non Distended + BS, no rebound or guarding.    Neurologic Cranial Nerve exam:- CN III-XII intact(No nystagmus), symmetric smile. Drift Test:- No drift. Romberg Exam:- Negative.  Heal to Toe Gait exam:-Normal. Finger to Nose:- Normal/Intact Strength:- 5/5 equal and symmetric strength both upper and lower extremities.  Rectum- on inspection. One small non thrombosed hemorrhoid at 9 oclock position.Mild tender on palpation.      Assessment & Plan:  For history of renal failure continue with dialysis.  For recent chf will repeat cxr and bnp. Make sure results are stable.Also please follow up with cardiologist.(pt has very low EF)  For recent constipation will get xray to confirm just constipated and not finding on xray that indicated obstruction. Can use miralax otc and see if this helps you have larger bm.  For history of anemia get cbc.  For nausea will rx zofran. If nausea controlled make sure eating  and staying hydrated. Adequate hydration may help with bowel movements.  For mild early hemorrhoid rx annusol hc suppository.  Keep regularly scheduled appointment with pcp, nephrologist and cardiologist.

## 2018-03-11 NOTE — Telephone Encounter (Signed)
Invalid number

## 2018-03-11 NOTE — Telephone Encounter (Signed)
Transition Care Management Follow-up Telephone Call- pt has 2 accounts so some info from hospitalization can be found in it.   How have you been since you were released from the hospital? "Doing ok, but having some nausea."   Do you understand why you were in the hospital? yes   Do you understand the discharge instructions? yes   Where were you discharged to? Home with wife   Items Reviewed:  Medications reviewed: pt does not have list near by  Allergies reviewed: yes  Dietary changes reviewed: yes  Referrals reviewed: yes   Functional Questionnaire:   Activities of Daily Living (ADLs):   He states they are independent in the following: ambulation, feeding, continence, grooming and toileting States they require assistance with the following: bathing and hygiene, dressing and Pt's wife assists him with dressing and bathing.   Any transportation issues/concerns?: no   Any patient concerns? no   Confirmed importance and date/time of follow-up visits scheduled yes  Provider Appointment booked with [name] NP  Confirmed with patient if condition begins to worsen call PCP or go to the ER.  Patient was given the office number and encouraged to call back with question or concerns.  : yes

## 2018-03-11 NOTE — Patient Instructions (Addendum)
For history of renal failure continue with dialysis.  For recent chf will repeat cxr and bnp. Make sure results are stable.Also please follow up with cardiologist.(pt has very low EF)  For history of anemia get cbc  For recent constipation will get xray to confirm just constipated and not finding on xray that indicated obstruction. Can use miralax otc and see if this helps you have larger bm.  For nausea will rx zofran. If nausea controlled make sure eating and staying hydrated. Adequate hydration may help with bowel movements.  For mild early hemorrhoid rx annusol hc suppository.  Keep regularly scheduled appointment with pcp, nephrologist and cardiologist.

## 2018-03-12 ENCOUNTER — Inpatient Hospital Stay: Payer: Medicare HMO | Admitting: Family

## 2018-03-12 DIAGNOSIS — D689 Coagulation defect, unspecified: Secondary | ICD-10-CM | POA: Insufficient documentation

## 2018-03-12 DIAGNOSIS — N2581 Secondary hyperparathyroidism of renal origin: Secondary | ICD-10-CM | POA: Diagnosis not present

## 2018-03-12 DIAGNOSIS — D631 Anemia in chronic kidney disease: Secondary | ICD-10-CM | POA: Insufficient documentation

## 2018-03-12 DIAGNOSIS — N189 Chronic kidney disease, unspecified: Secondary | ICD-10-CM | POA: Insufficient documentation

## 2018-03-12 DIAGNOSIS — N186 End stage renal disease: Secondary | ICD-10-CM | POA: Diagnosis not present

## 2018-03-13 ENCOUNTER — Telehealth: Payer: Self-pay | Admitting: Nurse Practitioner

## 2018-03-13 NOTE — Telephone Encounter (Signed)
Will review at his hospital follow up appointment.

## 2018-03-13 NOTE — Telephone Encounter (Signed)
Received call from patient's wife and called her back. [name] was requesting DNR form for [name]. Discussed with [name] that Priest had chosen Full Code status when I saw him in the hospital. Encouraged her to discuss his decisions him and his primary care physician and nephrologist. Told her they could provide DNR forms also if he had changed his mind about [place] [facility] [place].  [name] also had not been referred for outpatient Palliative follow-up for continued GOC and symptom mgmt of CHF and ESRD on HD as requested. Encouraged [name] to request this for patient from primary care MD. He is a [facility] patient and would benefit from their Care Connections program.  Mariana Kaufman, [facility]  Please call Palliative Medicine team phone with any questions [phone]. For individual providers please see AMION.

## 2018-03-14 ENCOUNTER — Telehealth: Payer: Self-pay | Admitting: Family

## 2018-03-14 DIAGNOSIS — N2581 Secondary hyperparathyroidism of renal origin: Secondary | ICD-10-CM | POA: Diagnosis not present

## 2018-03-14 DIAGNOSIS — N186 End stage renal disease: Secondary | ICD-10-CM | POA: Diagnosis not present

## 2018-03-14 NOTE — Telephone Encounter (Signed)
Copied from [place] [id number]. Topic: Referral - Request >> Mar 14, 2018  9:57 AM Robina Ade, Helene Kelp D wrote: Reason for CRM: Patient called and would like a home care nurse to be sent to his home to check on him. He would like to have the same home care nurse he had in the past. Please call patient back, thanks.

## 2018-03-15 NOTE — Telephone Encounter (Signed)
Called patient to get more details on this message. He said his wife is the one that knows and she was not home at the time. We will try to call him back later.

## 2018-03-16 DIAGNOSIS — N2581 Secondary hyperparathyroidism of renal origin: Secondary | ICD-10-CM | POA: Diagnosis not present

## 2018-03-16 DIAGNOSIS — N186 End stage renal disease: Secondary | ICD-10-CM | POA: Diagnosis not present

## 2018-03-19 ENCOUNTER — Ambulatory Visit: Payer: Medicare HMO | Admitting: Cardiovascular Disease

## 2018-03-19 DIAGNOSIS — N2581 Secondary hyperparathyroidism of renal origin: Secondary | ICD-10-CM | POA: Diagnosis not present

## 2018-03-19 DIAGNOSIS — N186 End stage renal disease: Secondary | ICD-10-CM | POA: Diagnosis not present

## 2018-03-20 NOTE — Telephone Encounter (Signed)
I have called a few times but his wife is never home when I call. He can not give me any information. Advised him to tell his wife to try to contact us.

## 2018-03-21 DIAGNOSIS — N2581 Secondary hyperparathyroidism of renal origin: Secondary | ICD-10-CM | POA: Diagnosis not present

## 2018-03-21 DIAGNOSIS — N186 End stage renal disease: Secondary | ICD-10-CM | POA: Diagnosis not present

## 2018-03-23 DIAGNOSIS — N2581 Secondary hyperparathyroidism of renal origin: Secondary | ICD-10-CM | POA: Diagnosis not present

## 2018-03-23 DIAGNOSIS — N186 End stage renal disease: Secondary | ICD-10-CM | POA: Diagnosis not present

## 2018-03-25 ENCOUNTER — Ambulatory Visit (INDEPENDENT_AMBULATORY_CARE_PROVIDER_SITE_OTHER): Payer: Medicare HMO | Admitting: Family

## 2018-03-25 ENCOUNTER — Encounter: Payer: Self-pay | Admitting: Family

## 2018-03-25 VITALS — BP 128/78 | HR 73 | Temp 97.7°F | Resp 16 | Ht 74.0 in | Wt 181.8 lb

## 2018-03-25 DIAGNOSIS — N186 End stage renal disease: Secondary | ICD-10-CM | POA: Diagnosis not present

## 2018-03-25 DIAGNOSIS — J449 Chronic obstructive pulmonary disease, unspecified: Secondary | ICD-10-CM

## 2018-03-25 DIAGNOSIS — M109 Gout, unspecified: Secondary | ICD-10-CM | POA: Diagnosis not present

## 2018-03-25 DIAGNOSIS — I4891 Unspecified atrial fibrillation: Secondary | ICD-10-CM

## 2018-03-25 DIAGNOSIS — Z23 Encounter for immunization: Secondary | ICD-10-CM | POA: Diagnosis not present

## 2018-03-25 DIAGNOSIS — I482 Chronic atrial fibrillation, unspecified: Secondary | ICD-10-CM | POA: Insufficient documentation

## 2018-03-25 LAB — POCT INR: INR: 2.9

## 2018-03-25 MED ORDER — COLCHICINE 0.6 MG PO TABS: 0.3000 mg | ORAL_TABLET | ORAL | 5 refills | Status: DC

## 2018-03-25 NOTE — Patient Instructions (Addendum)
Continue colchicine but only 1/2 tab twice weekly for gout prevention. You will be contacted about scheduling your appointment with the coumadin clinic.

## 2018-03-25 NOTE — Progress Notes (Signed)
Subjective:    Patient ID: David [name], male    DOB: February 04, 1941, 77 y.o.   MRN: [id number]  HPI  Pt is a 77 yr old male who presents today for hospital follow up.  The patient was admitted 02/17/18 through 03/09/18.  On the day of admission he presented with acute shortness of breath. Per review of chart he was noted to be agonal and pulseless in the parking lot of the emergency department.  CPR was started was continued for an estimated 5-10 minutes before return of pulse.  The patient was intubated.  Chest x-ray was consistent with pulmonary edema his creatinine was elevated at 5.32 and he was admitted by critical care service.    New-onset atrial fibrillation-he was seen during this admission by cardiology and continued on 200 mg of amiodarone.  He was also started on warfarin which was to be monitored by the Coumadin clinic.  He was maintained on a low-dose of Coreg. Reports one tablet of coumadin daily.   Acute respiratory failure secondary to pulmonary edema-H e was extubated on March 19. He did have acute gout flare in his feet Was continued on colchicine and prednisone. Reports that his gout symptoms are well controlled. Requests refill on colchicine.   COPD-this was felt to be stable at time of discharge.  HIV- most recent HIV RNA was undetectable and most recent CD4 count was 430.  The patient is continued on Tivicay and Descovy.   ESRD- HD was started during most recent hospitalization. Goes T, Thurs and Saturday.     Review of Systems See HPI  Past Medical History:  Diagnosis Date  . Acute on chronic systolic and diastolic heart failure, NYHA class 4 ([place])   . Anemia, iron deficiency 11/15/2011  . Arthritis   . Arthritis    "hands, right knee, feet" (02/21/2018)  . Cancer [facility])    hx of prostate; s/p radioactive seed implant 10/2009 Dr Janice Norrie  . Cardiac arrest ([place]) 02/17/2018  . CHF (congestive heart failure) ([place])   . Chronic kidney disease   . Chronic lower  back pain   . CKD (chronic kidney disease) stage V requiring chronic dialysis ([place])   . ESRD on dialysis ([place])    started 02/2018  . Glaucoma   . Gout    daily RX (02/21/2018)  . Heart murmur    "mild" per pt  . History of cardiac cath 2004   negative for CAD  . History of myocardial perfusion scan 02/2010   negative for coronary insufficiency (LVEF 27%)  . HIV (human immunodeficiency virus infection) ([place])   . HIV infection ([place])   . Hyperlipidemia   . Hypertension    followed by [facility] and Vascular (Dr Dani Gobble Croitoru)  . Hypertension   . Nonischemic cardiomyopathy ([place])   . Pneumonia 11/2017  . Prostate cancer ([place])   . Sinus bradycardia   . Stroke [facility])    "mini stroke" years ago  . Systolic and diastolic CHF, acute ([place]) 06/2010   felt to be secondary to hypertensive cardiomyopathy     Social History   Socioeconomic History  . Marital status: Married    Spouse name: Not on file  . Number of children: 2  . Years of education: 50  . Highest education level: Not on file  Occupational History  . Occupation: retired, picks up Aeronautical engineer: RETIRED  Social Needs  . Financial resource strain: Not on file  . Food insecurity:  Worry: Not on file    Inability: Not on file  . Transportation needs:    Medical: Not on file    Non-medical: Not on file  Tobacco Use  . Smoking status: Former Smoker    Packs/day: 1.00    Years: 30.00    Pack years: 30.00    Types: Cigarettes    Last attempt to quit: 2006    Years since quitting: 13.3  . Smokeless tobacco: Never Used  Substance and Sexual Activity  . Alcohol use: Not Currently    Comment: once a week-wine  . Drug use: Never  . Sexual activity: Not Currently  Lifestyle  . Physical activity:    Days per week: Not on file    Minutes per session: Not on file  . Stress: Not on file  Relationships  . Social connections:    Talks on phone: Not on file    Gets together: Not on file    Attends  religious service: Not on file    Active member of club or organization: Not on file    Attends meetings of clubs or organizations: Not on file    Relationship status: Not on file  . Intimate partner violence:    Fear of current or ex partner: Not on file    Emotionally abused: Not on file    Physically abused: Not on file    Forced sexual activity: Not on file  Other Topics Concern  . Not on file  Social History Narrative   ** Merged History Encounter **       Stays active at home Regular exercise: no Drinks 2 cups of coffee a week, 1 mountain dew soda a day.    Past Surgical History:  Procedure Laterality Date  . AV FISTULA PLACEMENT Right 10/13/2016   Procedure: ARTERIOVENOUS (AV) FISTULA CREATION;  Surgeon: [name], MD;  Location: [place];  Service: Vascular;  Laterality: Right;  . AV FISTULA PLACEMENT Right 10/13/2016   [name] [phone]  . AV FISTULA PLACEMENT Left 02/25/2018   Procedure: INSERTION OF ARTERIOVENOUS (AV) GORE-TEX GRAFT LEFT UPPER ARM;  Surgeon: Angelia Mould, MD;  Location: [place];  Service: Vascular;  Laterality: Left;  . BASCILIC VEIN TRANSPOSITION Left 06/24/2015   Procedure: BASILIC VEIN TRANSPOSITION;  Surgeon: Mal Misty, MD;  Location: [place];  Service: Vascular;  Laterality: Left;  . BASCILIC VEIN TRANSPOSITION Left 3/35/4562 Bascilic vein transposition (Left)   [name] [id number]  . CARDIAC CATHETERIZATION  02/04/2003   mildly depressed LV systolic fx EF 28%,[id number] coronaries/abdominal aorta/renal arteries.  Marland Kitchen CARDIAC CATHETERIZATION  2004  . CATARACT EXTRACTION, BILATERAL Bilateral   . COLONOSCOPY    . ESOPHAGOGASTRODUODENOSCOPY (EGD) WITH PROPOFOL N/A 05/05/2014   Procedure: ESOPHAGOGASTRODUODENOSCOPY (EGD) WITH PROPOFOL;  Surgeon: Missy Sabins, MD;  Location: [facility];  Service: Endoscopy;  Laterality: N/A;  . EYE SURGERY Bilateral    cataract surgery   . EYE SURGERY Bilateral    glaucoma surgery  . EYE SURGERY    . GLAUCOMA SURGERY  Bilateral   . INSERTION PROSTATE RADIATION SEED    . IR FLUORO GUIDE CV LINE RIGHT  02/18/2018  . IR US GUIDE VASC ACCESS RIGHT  02/18/2018  . LEFT HEART CATH AND CORONARY ANGIOGRAPHY N/A 02/20/2018   Procedure: LEFT HEART CATH AND CORONARY ANGIOGRAPHY;  Surgeon: Jettie Booze, MD;  Location: [place] CV [facility];  Service: Cardiovascular;  Laterality: N/A;  . NM MYOCAR PERF WALL MOTION  02/21/2010   normal  .  RADIOACTIVE SEED IMPLANT  2010   prostate cancer  . THROMBECTOMY W/ EMBOLECTOMY Left 02/26/2018   Procedure: THROMBECTOMY ARTERIOVENOUS GRAFT;  Surgeon: Angelia Mould, MD;  Location: [place];  Service: Vascular;  Laterality: Left;  . ULTRASOUND GUIDANCE FOR VASCULAR ACCESS  02/20/2018   Procedure: Ultrasound Guidance For Vascular Access;  Surgeon: Jettie Booze, MD;  Location: [place] CV LAB;  Service: Cardiovascular;;  . US ECHOCARDIOGRAPHY  02/06/2012   mild LVH,LA mod. dilated,mild-mod. 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 AI.    Family History  Problem Relation Age of Onset  . Hypertension Mother   . Thyroid disease Mother   . Cholelithiasis Daughter   . Cholelithiasis Son   . Hypertension Maternal Grandmother   . Diabetes Maternal Grandmother   . Heart attack Neg Hx   . Hyperlipidemia Neg Hx     Allergies  Allergen Reactions  . Codeine Other (See Comments)    Causes constipation  . Cozaar [Losartan Potassium] Other (See Comments)    Causes constipation     Current Outpatient Medications on File Prior to Visit  Medication Sig Dispense Refill  . amiodarone (PACERONE) 200 MG tablet Take 1 tablet (200 mg total) by mouth daily. 30 tablet 0  . aspirin EC 81 MG tablet Take 81 mg by mouth daily.    . brimonidine (ALPHAGAN) 0.15 % ophthalmic solution Place 1 drop into the left eye 2 (two) times daily.  3  . DESCOVY 200-25 MG tablet Take 1 tablet by mouth every morning.  11  . dolutegravir (TIVICAY) 50 MG tablet Take 1 tablet  (50 mg total) by mouth daily. 30 tablet 11  . dorzolamide-timolol (COSOPT) 22.3-6.8 MG/ML ophthalmic solution Place 1 drop into the left eye 2 (two) times daily.    Marland Kitchen emtricitabine-tenofovir AF (DESCOVY) 200-25 MG tablet Take 1 tablet by mouth daily. 30 tablet 11  . latanoprost (XALATAN) 0.005 % ophthalmic solution Place 1 drop into the left eye at bedtime.     . midodrine (PROAMATINE) 5 MG tablet Take 1 tablet (5 mg total) by mouth 2 (two) times daily with a meal. 60 tablet 0  . polyethylene glycol (MIRALAX / GLYCOLAX) packet Take 17 g by mouth daily as needed for mild constipation. 14 each 0  . scopolamine (TRANSDERM-SCOP) 1 MG/3DAYS Place 1 patch (1.5 mg total) onto the skin every 3 (three) days. 10 patch 0  . TIVICAY 50 MG tablet Take 50 mg by mouth every morning.  11  . Vitamin D, Ergocalciferol, (DRISDOL) 50000 UNITS CAPS capsule Take 50,000 units by mouth once a week on Saturdays  2  . warfarin (COUMADIN) 5 MG tablet Take 1 tablet (5 mg total) by mouth daily at 6 PM. 30 tablet 0  . colchicine 0.6 MG tablet Take 1 tablet (0.6 mg total) by mouth daily for 7 days. 30 tablet 0  . sulfamethoxazole-trimethoprim (BACTRIM DS,SEPTRA DS) 800-160 MG tablet Take 1 tablet by mouth 3 (three) times a week.     No current facility-administered medications on file prior to visit.     BP 128/78 (BP Location: Right Arm, Cuff Size: Normal)   Pulse 73   Temp 97.7 F (36.5 C) (Oral)   Resp 16   Ht 6\' 2"  (1.88 m)   Wt 181 lb 12.8 oz (82.5 kg)   SpO2 98%   BMI 23.34 kg/m       Objective:   Physical Exam  Constitutional: He is oriented to person, place,  and time. He appears well-developed and well-nourished. No distress.  HENT:  Head: Normocephalic and atraumatic.  Cardiovascular: Normal rate and regular rhythm.  No murmur heard. Pulmonary/Chest: Effort normal and breath sounds normal. No respiratory distress. He has no wheezes. He has no rales.  Musculoskeletal: He exhibits no edema.    Neurological: He is alert and oriented to person, place, and time.  Skin: Skin is warm and dry.  Psychiatric: He has a normal mood and affect. His behavior is normal. Thought content normal.          Assessment & Plan:  AF-  On coumadin.  INR 2.9  Arrange follow up in coumadin clinic. Rate stable on amiodarone- management per cardiology.  ESRD- continues HD, per renal.  COPD- clinically stable.  Monitor.   CHF- appears euvolemic, weight stable.   Wt Readings from Last 3 Encounters:  03/25/18 181 lb 12.8 oz (82.5 kg)  03/11/18 179 lb 3.2 oz (81.3 kg)  03/09/18 173 lb 8 oz (78.7 kg)   Gout- stable. Refill provided for colchicine at renal dosing.

## 2018-03-26 ENCOUNTER — Inpatient Hospital Stay: Payer: Medicare HMO | Admitting: Family

## 2018-03-26 DIAGNOSIS — N2581 Secondary hyperparathyroidism of renal origin: Secondary | ICD-10-CM | POA: Diagnosis not present

## 2018-03-26 DIAGNOSIS — N186 End stage renal disease: Secondary | ICD-10-CM | POA: Diagnosis not present

## 2018-03-27 ENCOUNTER — Ambulatory Visit (INDEPENDENT_AMBULATORY_CARE_PROVIDER_SITE_OTHER): Payer: Medicare HMO | Admitting: Pharmacist

## 2018-03-27 DIAGNOSIS — I4891 Unspecified atrial fibrillation: Secondary | ICD-10-CM

## 2018-03-27 DIAGNOSIS — Z7901 Long term (current) use of anticoagulants: Secondary | ICD-10-CM | POA: Diagnosis not present

## 2018-03-27 DIAGNOSIS — Z5181 Encounter for therapeutic drug level monitoring: Secondary | ICD-10-CM | POA: Diagnosis not present

## 2018-03-27 DIAGNOSIS — Z515 Encounter for palliative care: Secondary | ICD-10-CM | POA: Insufficient documentation

## 2018-03-27 LAB — POCT INR: INR: 2.6

## 2018-03-27 NOTE — Patient Instructions (Signed)
Description   Continue 1 tablet (5mg ) daily. Recheck INR in 1 week. Call [phone] with new medications.  A full discussion of the nature of anticoagulants has been carried out.  A benefit risk analysis has been presented to the patient, so that they understand the justification for choosing anticoagulation at this time. The need for frequent and regular monitoring, precise dosage adjustment and compliance is stressed.  Side effects of potential bleeding are discussed.  The patient should avoid any OTC items containing aspirin or ibuprofen, and should avoid great swings in general diet.  Avoid alcohol consumption.  Call if any signs of abnormal bleeding.

## 2018-03-28 DIAGNOSIS — N2581 Secondary hyperparathyroidism of renal origin: Secondary | ICD-10-CM | POA: Diagnosis not present

## 2018-03-28 DIAGNOSIS — N186 End stage renal disease: Secondary | ICD-10-CM | POA: Diagnosis not present

## 2018-03-30 DIAGNOSIS — N186 End stage renal disease: Secondary | ICD-10-CM | POA: Diagnosis not present

## 2018-03-30 DIAGNOSIS — N2581 Secondary hyperparathyroidism of renal origin: Secondary | ICD-10-CM | POA: Diagnosis not present

## 2018-04-01 NOTE — Telephone Encounter (Signed)
Was seen last week.

## 2018-04-02 DIAGNOSIS — N2581 Secondary hyperparathyroidism of renal origin: Secondary | ICD-10-CM | POA: Diagnosis not present

## 2018-04-02 DIAGNOSIS — N186 End stage renal disease: Secondary | ICD-10-CM | POA: Diagnosis not present

## 2018-04-03 ENCOUNTER — Ambulatory Visit: Payer: Medicare HMO | Admitting: Cardiology

## 2018-04-03 ENCOUNTER — Other Ambulatory Visit: Payer: Self-pay | Admitting: Cardiovascular Disease

## 2018-04-03 ENCOUNTER — Encounter: Payer: Self-pay | Admitting: Cardiology

## 2018-04-03 ENCOUNTER — Ambulatory Visit (INDEPENDENT_AMBULATORY_CARE_PROVIDER_SITE_OTHER): Payer: Medicare HMO | Admitting: Pharmacist

## 2018-04-03 VITALS — BP 125/76 | HR 80 | Ht 74.0 in | Wt 182.0 lb

## 2018-04-03 DIAGNOSIS — I2510 Atherosclerotic heart disease of native coronary artery without angina pectoris: Secondary | ICD-10-CM

## 2018-04-03 DIAGNOSIS — Z21 Asymptomatic human immunodeficiency virus [HIV] infection status: Secondary | ICD-10-CM

## 2018-04-03 DIAGNOSIS — I959 Hypotension, unspecified: Secondary | ICD-10-CM

## 2018-04-03 DIAGNOSIS — I4891 Unspecified atrial fibrillation: Secondary | ICD-10-CM

## 2018-04-03 DIAGNOSIS — N186 End stage renal disease: Secondary | ICD-10-CM | POA: Diagnosis not present

## 2018-04-03 DIAGNOSIS — I428 Other cardiomyopathies: Secondary | ICD-10-CM | POA: Diagnosis not present

## 2018-04-03 DIAGNOSIS — Z7901 Long term (current) use of anticoagulants: Secondary | ICD-10-CM | POA: Diagnosis not present

## 2018-04-03 DIAGNOSIS — I469 Cardiac arrest, cause unspecified: Secondary | ICD-10-CM

## 2018-04-03 DIAGNOSIS — B20 Human immunodeficiency virus [HIV] disease: Secondary | ICD-10-CM | POA: Diagnosis not present

## 2018-04-03 DIAGNOSIS — I420 Dilated cardiomyopathy: Secondary | ICD-10-CM

## 2018-04-03 DIAGNOSIS — Z5181 Encounter for therapeutic drug level monitoring: Secondary | ICD-10-CM

## 2018-04-03 LAB — POCT INR: INR: 2

## 2018-04-03 MED ORDER — WARFARIN SODIUM 5 MG PO TABS: 5.0000 mg | ORAL_TABLET | Freq: Every day | ORAL | 1 refills | Status: DC

## 2018-04-03 MED ORDER — AMIODARONE HCL 200 MG PO TABS: 200.0000 mg | ORAL_TABLET | Freq: Every day | ORAL | 1 refills | Status: DC

## 2018-04-03 NOTE — Assessment & Plan Note (Signed)
EF 15% by echo March 2019

## 2018-04-03 NOTE — Assessment & Plan Note (Signed)
EF 15-20% by echo March 2019

## 2018-04-03 NOTE — Assessment & Plan Note (Signed)
This had limited medical Rx of his CM- improved now, probably secondary to resumption of NSR

## 2018-04-03 NOTE — Progress Notes (Signed)
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 The pt has a history of HIV/AIDS, prostate cancer status post brachii therapy, chronic kidney disease stage V, nonischemic cardiomyopathy, and prior hypertension. He presented to the emergency department on 02/17/18 after waking up feeling severely short of breath and was noted to be agonal and pulseless in the parking lot. CPR was started, after 5-10 minutes he had return of spontaneous circulation, he was intubated, chest x-ray showed pulmonary edema and he was determined to have an asystolic arrest.  Cardiac catheterization 02/22/18 showed nonobstructive CAD. His EF was 15-20% on echocardiogram. He also had new onset AF that admission and was placed on Amiodarone and Coumadin. His B/P was low and we were unable to an ACE, ARB, or beta blocker. He was discharged 03/09/18 on Amiodarone 200 mg daily. He did get placed on HD durrng this last admission as well and has already had issues of graft failure, possible from low cardiac output. He has an external catheter for HD and goes Tuesday, Thursday, Saturday. Dr Posey Pronto follows him.    Current Outpatient Medications  Medication Sig Dispense Refill  . amiodarone (PACERONE) 200 MG tablet Take 1 tablet (200 mg total) by mouth daily. 30 tablet 0  . aspirin EC 81 MG tablet Take 81 mg by mouth daily.    . brimonidine (ALPHAGAN) 0.15 % ophthalmic solution Place 1 drop into the left eye 2 (two) times daily.  3  . colchicine 0.6 MG tablet Take 0.5 tablets (0.3 mg total) by mouth 2 (two) times a week. 8 tablet 5  . DESCOVY 200-25 MG tablet Take 1 tablet by mouth every morning.  11  . dorzolamide-timolol (COSOPT) 22.3-6.8 MG/ML ophthalmic solution Place 1 drop into the left eye 2 (two) times daily.    Marland Kitchen latanoprost (XALATAN) 0.005 %  ophthalmic solution Place 1 drop into the left eye at bedtime.     . midodrine (PROAMATINE) 5 MG tablet Take 1 tablet (5 mg total) by mouth 2 (two) times daily with a meal. 60 tablet 0  . polyethylene glycol (MIRALAX / GLYCOLAX) packet Take 17 g by mouth daily as needed for mild constipation. 14 each 0  . scopolamine (TRANSDERM-SCOP) 1 MG/3DAYS Place 1 patch (1.5 mg total) onto the skin every 3 (three) days. 10 patch 0  . sulfamethoxazole-trimethoprim (BACTRIM DS,SEPTRA DS) 800-160 MG tablet Take 1 tablet by mouth 3 (three) times a week.    Marland Kitchen TIVICAY 50 MG tablet Take 50 mg by mouth every morning.  11  . Vitamin D, Ergocalciferol, (DRISDOL) 50000 UNITS CAPS capsule Take 50,000 units by mouth once a week on Saturdays  2  . warfarin (COUMADIN) 5 MG tablet Take 1 tablet (5 mg total) by mouth daily at 6 PM. 30 tablet 0   No current facility-administered medications for this visit.     Allergies  Allergen Reactions  . Codeine Other (See Comments)    Causes constipation  . Cozaar [Losartan Potassium] Other (See Comments)    Causes constipation     Past Medical History:  Diagnosis Date  . Acute on chronic systolic and diastolic heart failure, NYHA class 4 ([place])   . Anemia, iron deficiency 11/15/2011  . Arthritis   . Arthritis    "hands, right knee, feet" (02/21/2018)  . Cancer ([place])    hx  of prostate; s/p radioactive seed implant 10/2009 Dr Janice Norrie  . Cardiac arrest ([place]) 02/17/2018  . CHF (congestive heart failure) ([place])   . Chronic kidney disease   . Chronic lower back pain   . CKD (chronic kidney disease) stage V requiring chronic dialysis ([place])   . ESRD on dialysis ([place])    started 02/2018  . Glaucoma   . Gout    daily RX (02/21/2018)  . Heart murmur    "mild" per pt  . History of cardiac cath 2004   negative for CAD  . History of myocardial perfusion scan 02/2010   negative for coronary insufficiency (LVEF 27%)  . HIV (human immunodeficiency virus infection) ([place])   . HIV  infection ([place])   . Hyperlipidemia   . Hypertension    followed by [facility] and Vascular (Dr Dani Gobble Croitoru)  . Hypertension   . Nonischemic cardiomyopathy ([place])   . Pneumonia 11/2017  . Prostate cancer ([place])   . Sinus bradycardia   . Stroke [facility])    "mini stroke" years ago  . Systolic and diastolic CHF, acute ([place]) 06/2010   felt to be secondary to hypertensive cardiomyopathy    Social History   Socioeconomic History  . Marital status: Married    Spouse name: Not on file  . Number of children: 2  . Years of education: 3  . Highest education level: Not on file  Occupational History  . Occupation: retired, picks up Aeronautical engineer: RETIRED  Social Needs  . Financial resource strain: Not on file  . Food insecurity:    Worry: Not on file    Inability: Not on file  . Transportation needs:    Medical: Not on file    Non-medical: Not on file  Tobacco Use  . Smoking status: Former Smoker    Packs/day: 1.00    Years: 30.00    Pack years: 30.00    Types: Cigarettes    Last attempt to quit: 2006    Years since quitting: 13.3  . Smokeless tobacco: Never Used  Substance and Sexual Activity  . Alcohol use: Not Currently    Comment: once a week-wine  . Drug use: Never  . Sexual activity: Not Currently  Lifestyle  . Physical activity:    Days per week: Not on file    Minutes per session: Not on file  . Stress: Not on file  Relationships  . Social connections:    Talks on phone: Not on file    Gets together: Not on file    Attends religious service: Not on file    Active member of club or organization: Not on file    Attends meetings of clubs or organizations: Not on file    Relationship status: Not on file  . Intimate partner violence:    Fear of current or ex partner: Not on file    Emotionally abused: Not on file    Physically abused: Not on file    Forced sexual activity: Not on file  Other Topics Concern  . Not on file  Social History  Narrative   ** Merged History Encounter **       Stays active at home Regular exercise: no Drinks 2 cups of coffee a week, 1 mountain dew soda a day.     Family History  Problem Relation Age of Onset  . Hypertension Mother   . Thyroid disease Mother   . Cholelithiasis Daughter   . Cholelithiasis Son   .  Hypertension Maternal Grandmother   . Diabetes Maternal Grandmother   . Heart attack Neg Hx   . Hyperlipidemia Neg Hx      Review of Systems: General: negative for chills, fever, night sweats or weight changes.  Cardiovascular: negative for chest pain, dyspnea on exertion, edema, orthopnea, palpitations, paroxysmal nocturnal dyspnea or shortness of breath Dermatological: negative for rash Respiratory: negative for cough or wheezing Urologic: negative for hematuria Abdominal: negative for nausea, vomiting, diarrhea, bright red blood per rectum, melena, or hematemesis Neurologic: negative for visual changes, syncope, or dizziness All other systems reviewed and are otherwise negative except as noted above.    Blood pressure 125/76, pulse 80, height 6\' 2"  (3.57 m), weight 182 lb (82.6 kg).  General appearance: alert, cooperative, appears stated age and no distress Lungs: clear to auscultation bilaterally Heart: regular rate and rhythm Skin: Skin color, texture, turgor normal. No rashes or lesions Neurologic: Grossly normal  EKG NSR, LAD, LVH  ASSESSMENT AND PLAN:   Cardiac arrest ([place]) Pt admitted 02/17/18 with cardiac arrest  CAD (coronary artery disease) Non critical CAD at cath 02/22/18- 50% OM2, 25% LAD  NICM (nonischemic cardiomyopathy) (HCC) EF 15-20% by echo March 2019  Nonischemic dilated cardiomyopathy ([place]) EF 15% by echo March 2019  ESRD (end stage renal disease) ([place]) HD started during this last admission- Tuesday, Thursday, and Friday  HIV (human immunodeficiency virus infection) ([place]) Followed by PCP  Hypotension This had limited medical Rx of his  CM- improved now, probably secondary to resumption of NSR  Chronic anticoagulation On Couamdin   PLAN  Continue current Rx. Consider addition of an ACE or ARB for LVD. I would want to clear that with Dr Oval Linsey and Dr Posey Pronto first. I'll arrange for a f/u with Dr Oval Linsey in 3 months.   Kerin Ransom PA-C 04/03/2018 3:31 PM

## 2018-04-03 NOTE — Assessment & Plan Note (Signed)
Followed by PCP

## 2018-04-03 NOTE — Assessment & Plan Note (Signed)
HD started during this last admission- Tuesday, Thursday, and Friday

## 2018-04-03 NOTE — Assessment & Plan Note (Signed)
Non critical CAD at cath 02/22/18- 50% OM2, 25% LAD

## 2018-04-03 NOTE — Patient Instructions (Signed)
Medication Instructions:  Your physician recommends that you continue on your current medications as directed. Please refer to the Current Medication list given to you today.  Labwork: None   Testing/Procedures: None   Follow-Up: Your physician recommends that you schedule a follow-up appointment in: 3 months with Dr Oval Linsey  Any Other Special Instructions Will Be Listed Below (If Applicable). If you need a refill on your cardiac medications before your next appointment, please call your pharmacy.

## 2018-04-03 NOTE — Assessment & Plan Note (Signed)
Pt admitted 02/17/18 with cardiac arrest

## 2018-04-03 NOTE — Assessment & Plan Note (Signed)
On Couamdin

## 2018-04-04 DIAGNOSIS — N2581 Secondary hyperparathyroidism of renal origin: Secondary | ICD-10-CM | POA: Diagnosis not present

## 2018-04-04 DIAGNOSIS — N186 End stage renal disease: Secondary | ICD-10-CM | POA: Diagnosis not present

## 2018-04-05 ENCOUNTER — Telehealth: Payer: Self-pay

## 2018-04-05 NOTE — Telephone Encounter (Signed)
-----   Message from Erlene Quan, [place] sent at 04/04/2018  2:21 PM EDT ----- This patient needs a follow up with Dr Sallyanne Kuster in August- you can cancel the appointment she has with Dr Oval Linsey.  Kerin Ransom PA-C 04/04/2018 2:21 PM

## 2018-04-05 NOTE — Telephone Encounter (Signed)
Sent Luke a message to clarify appointment needed. Waiting for a response. Lurena Joiner out of the office until Tuesday (04/09/2018).

## 2018-04-06 DIAGNOSIS — N2581 Secondary hyperparathyroidism of renal origin: Secondary | ICD-10-CM | POA: Diagnosis not present

## 2018-04-06 DIAGNOSIS — N186 End stage renal disease: Secondary | ICD-10-CM | POA: Diagnosis not present

## 2018-04-08 NOTE — Telephone Encounter (Signed)
Spoke with Lurena Joiner and he stated it was ok to cancel appointment with Dr Oval Linsey and Keep appointment with Dr Loletha Grayer in July. Patient voiced understanding.

## 2018-04-09 DIAGNOSIS — N186 End stage renal disease: Secondary | ICD-10-CM | POA: Diagnosis not present

## 2018-04-09 DIAGNOSIS — N2581 Secondary hyperparathyroidism of renal origin: Secondary | ICD-10-CM | POA: Diagnosis not present

## 2018-04-11 DIAGNOSIS — N186 End stage renal disease: Secondary | ICD-10-CM | POA: Diagnosis not present

## 2018-04-11 DIAGNOSIS — N2581 Secondary hyperparathyroidism of renal origin: Secondary | ICD-10-CM | POA: Diagnosis not present

## 2018-04-13 DIAGNOSIS — N186 End stage renal disease: Secondary | ICD-10-CM | POA: Diagnosis not present

## 2018-04-13 DIAGNOSIS — N2581 Secondary hyperparathyroidism of renal origin: Secondary | ICD-10-CM | POA: Diagnosis not present

## 2018-04-16 DIAGNOSIS — N186 End stage renal disease: Secondary | ICD-10-CM | POA: Diagnosis not present

## 2018-04-16 DIAGNOSIS — N2581 Secondary hyperparathyroidism of renal origin: Secondary | ICD-10-CM | POA: Diagnosis not present

## 2018-04-18 DIAGNOSIS — N186 End stage renal disease: Secondary | ICD-10-CM | POA: Diagnosis not present

## 2018-04-18 DIAGNOSIS — N2581 Secondary hyperparathyroidism of renal origin: Secondary | ICD-10-CM | POA: Diagnosis not present

## 2018-04-19 ENCOUNTER — Telehealth: Payer: Self-pay | Admitting: Family

## 2018-04-19 LAB — PROTIME-INR: INR: 2.5 — AB (ref 0.9–1.1)

## 2018-04-19 MED ORDER — ONDANSETRON HCL 4 MG PO TABS: 4.0000 mg | ORAL_TABLET | Freq: Three times a day (TID) | ORAL | 0 refills | Status: DC | PRN

## 2018-04-19 NOTE — Telephone Encounter (Signed)
Spoke with patient. He states that he had some nausea and vomiting yesterday. Vomited x 1 today this AM. Then he ate lunch/liquids and kept down. He has scopolamine patch on. Advised him that he can continue patch x 3 days, then switch over to prn zofran. I advised him that if he is unable to keep down food/liquid he should proceed to the ER for further evaluation and he verbalizes understanding.

## 2018-04-19 NOTE — Telephone Encounter (Signed)
Copied from [place] [id number]. Topic: Quick Communication - Rx Refill/Question >> Apr 19, 2018  2:23 PM Scherrie Gerlach wrote: Medication: scopolamine (TRANSDERM-SCOP) 1 MG/3DAYS  Pt was prescribed this in the hospital, but now is out.  Pt states he is still throwing up and continues to have nausea and wants to know if Lenna Sciara wants to keep him on this med or prescribe something else. Pt states these patches work, but is there a pill for nausea? [facility] [place], [place] [place] [phone] (Phone) [phone] (Fax)

## 2018-04-20 DIAGNOSIS — N186 End stage renal disease: Secondary | ICD-10-CM | POA: Diagnosis not present

## 2018-04-20 DIAGNOSIS — N2581 Secondary hyperparathyroidism of renal origin: Secondary | ICD-10-CM | POA: Diagnosis not present

## 2018-04-22 DIAGNOSIS — H401133 Primary open-angle glaucoma, bilateral, severe stage: Secondary | ICD-10-CM | POA: Diagnosis not present

## 2018-04-22 DIAGNOSIS — Z961 Presence of intraocular lens: Secondary | ICD-10-CM | POA: Diagnosis not present

## 2018-04-23 ENCOUNTER — Ambulatory Visit (INDEPENDENT_AMBULATORY_CARE_PROVIDER_SITE_OTHER): Payer: Medicare HMO | Admitting: Pharmacist Clinician (PhC)/ Clinical Pharmacy Specialist

## 2018-04-23 DIAGNOSIS — N2581 Secondary hyperparathyroidism of renal origin: Secondary | ICD-10-CM | POA: Diagnosis not present

## 2018-04-23 DIAGNOSIS — Z7901 Long term (current) use of anticoagulants: Secondary | ICD-10-CM | POA: Diagnosis not present

## 2018-04-23 DIAGNOSIS — N186 End stage renal disease: Secondary | ICD-10-CM | POA: Diagnosis not present

## 2018-04-23 DIAGNOSIS — I4891 Unspecified atrial fibrillation: Secondary | ICD-10-CM

## 2018-04-23 DIAGNOSIS — Z5181 Encounter for therapeutic drug level monitoring: Secondary | ICD-10-CM

## 2018-04-24 ENCOUNTER — Other Ambulatory Visit: Payer: Self-pay

## 2018-04-24 ENCOUNTER — Ambulatory Visit (INDEPENDENT_AMBULATORY_CARE_PROVIDER_SITE_OTHER): Payer: Medicare HMO | Admitting: Family Medicine

## 2018-04-24 ENCOUNTER — Encounter: Payer: Self-pay | Admitting: Family Medicine

## 2018-04-24 VITALS — BP 126/80 | HR 113 | Temp 98.5°F | Resp 18 | Ht 74.0 in | Wt 176.8 lb

## 2018-04-24 DIAGNOSIS — N186 End stage renal disease: Secondary | ICD-10-CM | POA: Diagnosis not present

## 2018-04-24 DIAGNOSIS — R112 Nausea with vomiting, unspecified: Secondary | ICD-10-CM | POA: Diagnosis not present

## 2018-04-24 LAB — COMPREHENSIVE METABOLIC PANEL
A/G RATIO: 1.7 (ref 1.2–2.2)
ALT: 23 IU/L (ref 0–44)
AST: 16 IU/L (ref 0–40)
Albumin: 5 g/dL — ABNORMAL HIGH (ref 3.5–4.8)
Alkaline Phosphatase: 130 IU/L — ABNORMAL HIGH (ref 39–117)
BUN/Creatinine Ratio: 3 — ABNORMAL LOW (ref 10–24)
BUN: 31 mg/dL — ABNORMAL HIGH (ref 8–27)
Bilirubin Total: 0.4 mg/dL (ref 0.0–1.2)
CALCIUM: 9.9 mg/dL (ref 8.6–10.2)
CO2: 30 mmol/L — AB (ref 20–29)
CREATININE: 8.87 mg/dL — AB (ref 0.76–1.27)
Chloride: 90 mmol/L — ABNORMAL LOW (ref 96–106)
GFR, EST AFRICAN AMERICAN: 6 mL/min/{1.73_m2} — AB (ref >59)
GFR, EST NON AFRICAN AMERICAN: 5 mL/min/{1.73_m2} — AB (ref >59)
GLOBULIN, TOTAL: 2.9 g/dL (ref 1.5–4.5)
Glucose: 107 mg/dL — ABNORMAL HIGH (ref 65–99)
POTASSIUM: 5 mmol/L (ref 3.5–5.2)
SODIUM: 142 mmol/L (ref 134–144)
TOTAL PROTEIN: 7.9 g/dL (ref 6.0–8.5)

## 2018-04-24 LAB — POCT CBC
Granulocyte percent: 4.7 %G — AB (ref 37–80)
HCT, POC: 41.4 % — AB (ref 43.5–53.7)
Hemoglobin: 13.1 g/dL — AB (ref 14.1–18.1)
LYMPH, POC: 25.6 — AB (ref 0.6–3.4)
MCH, POC: 32.1 pg — AB (ref 27–31.2)
MCHC: 31.6 g/dL — AB (ref 31.8–35.4)
MCV: 101.4 fL — AB (ref 80–97)
MID (CBC): 66.3 — AB (ref 0–0.9)
MPV: 7.1 fL (ref 0–99.8)
PLATELET COUNT, POC: 274 10*3/uL (ref 142–424)
POC Granulocyte: 0.6 — AB (ref 2–6.9)
POC LYMPH %: 8.1 % — AB (ref 10–50)
POC MID %: 1.8 % (ref 0–12)
RBC: 4.09 M/uL — AB (ref 4.69–6.13)
RDW, POC: 18.5 %
WBC: 7.1 10*3/uL (ref 4.6–10.2)

## 2018-04-24 MED ORDER — SCOPOLAMINE 1 MG/3DAYS TD PT72: 1.0000 | MEDICATED_PATCH | TRANSDERMAL | 0 refills | Status: DC

## 2018-04-24 NOTE — Telephone Encounter (Signed)
Spoke with pt. He stats n/v has persisted since he spoke with [place] last week. Reports that symptoms worsened yesterday after dialysis. He did not speak to dialysis doctor about this yesterday. Per verbal from PCP, she would like pt to see someone today? Pt is close to [facility] but they have no availability today. PCP recommends urgent care. Advised pt to go to Urgent Care on [address] as they will have access to his records. Pt agrees to plan.

## 2018-04-24 NOTE — Patient Instructions (Signed)
     IF you received an x-ray today, you will receive an invoice from [facility]. Please contact [facility] at [phone] with questions or concerns regarding your invoice.   IF you received labwork today, you will receive an invoice from [facility]. Please contact [facility] at [phone] with questions or concerns regarding your invoice.   Our billing staff will not be able to assist you with questions regarding bills from these companies.  You will be contacted with the lab results as soon as they are available. The fastest way to get your results is to activate your My Chart account. Instructions are located on the last page of this paperwork. If you have not heard from us regarding the results in 2 weeks, please contact this office.     

## 2018-04-24 NOTE — Telephone Encounter (Signed)
Patient calling again and states he is still throwing up. Would like providers advice. Meds did not help. Please advise Call back [phone]

## 2018-04-24 NOTE — Progress Notes (Signed)
Chief Complaint  Patient presents with  . New Patient (Initial Visit)    establish care.  Nausea x yesterday after leaving dialysis.  Emesis after eating or drinking, numerous times.  All the food he ate last night came up and after drinking it comes back up. No fever noticec    HPI   Patient is here to establish care  He has history of ESRD on HD, T, THR, SAT He reports that he went home after dialysis and ate  He got home around 4pm until midnight when he started vomiting The vomitus looked like food This morning he vomited again after eating an egg He states that he has been throwing up water He states that he also vomited up some phlegm He denies diaphoresis or abdominal  He denies fevers or chills He does not have peripheral access in the arm because the fistula didn't work  He states that he wwas given nausea pills but they do not work He had a patch for nausea that worked well   In March 2019 he was hospitalized for cardiac arrest and was diagnosed with new oset afib. He also has a history of  HIV on Tivicay and Descovy with undetectable HIV RNA and CD4 count of 430 He has an EF of 15-20%   Past Medical History:  Diagnosis Date  . Acute on chronic systolic and diastolic heart failure, NYHA class 4 ([place])   . Anemia, iron deficiency 11/15/2011  . Arthritis   . Arthritis    "hands, right knee, feet" (02/21/2018)  . Cancer [facility])    hx of prostate; s/p radioactive seed implant 10/2009 Dr Janice Norrie  . Cardiac arrest ([place]) 02/17/2018  . CHF (congestive heart failure) ([place])   . Chronic kidney disease   . Chronic lower back pain   . CKD (chronic kidney disease) stage V requiring chronic dialysis ([place])   . ESRD on dialysis ([place])    started 02/2018  . Glaucoma   . Gout    daily RX (02/21/2018)  . Heart murmur    "mild" per pt  . History of cardiac cath 2004   negative for CAD  . History of myocardial perfusion scan 02/2010   negative for coronary insufficiency (LVEF 27%)    . HIV (human immunodeficiency virus infection) ([place])   . HIV infection ([place])   . Hyperlipidemia   . Hypertension    followed by [facility] and Vascular (Dr Dani Gobble Croitoru)  . Hypertension   . Nonischemic cardiomyopathy ([place])   . Pneumonia 11/2017  . Prostate cancer ([place])   . Sinus bradycardia   . Stroke [facility])    "mini stroke" years ago  . Systolic and diastolic CHF, acute ([place]) 06/2010   felt to be secondary to hypertensive cardiomyopathy    Current Outpatient Medications  Medication Sig Dispense Refill  . amiodarone (PACERONE) 200 MG tablet Take 1 tablet (200 mg total) by mouth daily. 30 tablet 1  . aspirin EC 81 MG tablet Take 81 mg by mouth daily.    . brimonidine (ALPHAGAN) 0.15 % ophthalmic solution Place 1 drop into the left eye 2 (two) times daily.  3  . colchicine 0.6 MG tablet Take 0.5 tablets (0.3 mg total) by mouth 2 (two) times a week. 8 tablet 5  . DESCOVY 200-25 MG tablet Take 1 tablet by mouth every morning.  11  . dorzolamide-timolol (COSOPT) 22.3-6.8 MG/ML ophthalmic solution Place 1 drop into the left eye 2 (two) times daily.    Marland Kitchen  latanoprost (XALATAN) 0.005 % ophthalmic solution Place 1 drop into the left eye at bedtime.     . ondansetron (ZOFRAN) 4 MG tablet Take 1 tablet (4 mg total) by mouth every 8 (eight) hours as needed for nausea or vomiting. 20 tablet 0  . polyethylene glycol (MIRALAX / GLYCOLAX) packet Take 17 g by mouth daily as needed for mild constipation. 14 each 0  . TIVICAY 50 MG tablet Take 50 mg by mouth every morning.  11  . Vitamin D, Ergocalciferol, (DRISDOL) 50000 UNITS CAPS capsule Take 50,000 units by mouth once a week on Saturdays  2  . warfarin (COUMADIN) 5 MG tablet Take 1 tablet (5 mg total) by mouth daily at 6 PM. 30 tablet 1  . scopolamine (TRANSDERM-SCOP, 1.5 MG,) 1 MG/3DAYS Place 1 patch (1.5 mg total) onto the skin every 3 (three) days. 24 patch 0  . sulfamethoxazole-trimethoprim (BACTRIM DS,SEPTRA DS) 800-160 MG tablet Take 1  tablet by mouth 3 (three) times a week.     No current facility-administered medications for this visit.     Allergies:  Allergies  Allergen Reactions  . Codeine Other (See Comments)    Causes constipation  . Cozaar [Losartan Potassium] Other (See Comments)    Causes constipation     Past Surgical History:  Procedure Laterality Date  . AV FISTULA PLACEMENT Right 10/13/2016   Procedure: ARTERIOVENOUS (AV) FISTULA CREATION;  Surgeon: [name], MD;  Location: [place];  Service: Vascular;  Laterality: Right;  . AV FISTULA PLACEMENT Right 10/13/2016   [name] [id number]  . AV FISTULA PLACEMENT Left 02/25/2018   Procedure: INSERTION OF ARTERIOVENOUS (AV) GORE-TEX GRAFT LEFT UPPER ARM;  Surgeon: Angelia Mould, MD;  Location: [place];  Service: Vascular;  Laterality: Left;  . BASCILIC VEIN TRANSPOSITION Left 06/24/2015   Procedure: BASILIC VEIN TRANSPOSITION;  Surgeon: Mal Misty, MD;  Location: [place];  Service: Vascular;  Laterality: Left;  . BASCILIC VEIN TRANSPOSITION Left 9/32/6712 Bascilic vein transposition (Left)   [name] [id number]  . CARDIAC CATHETERIZATION  02/04/2003   mildly depressed LV systolic fx EF 82%,[id number] coronaries/abdominal aorta/renal arteries.  Marland Kitchen CARDIAC CATHETERIZATION  2004  . CATARACT EXTRACTION, BILATERAL Bilateral   . COLONOSCOPY    . ESOPHAGOGASTRODUODENOSCOPY (EGD) WITH PROPOFOL N/A 05/05/2014   Procedure: ESOPHAGOGASTRODUODENOSCOPY (EGD) WITH PROPOFOL;  Surgeon: Missy Sabins, MD;  Location: [facility];  Service: Endoscopy;  Laterality: N/A;  . EYE SURGERY Bilateral    cataract surgery   . EYE SURGERY Bilateral    glaucoma surgery  . EYE SURGERY    . GLAUCOMA SURGERY Bilateral   . INSERTION PROSTATE RADIATION SEED    . IR FLUORO GUIDE CV LINE RIGHT  02/18/2018  . IR US GUIDE VASC ACCESS RIGHT  02/18/2018  . LEFT HEART CATH AND CORONARY ANGIOGRAPHY N/A 02/20/2018   Procedure: LEFT HEART CATH AND CORONARY ANGIOGRAPHY;  Surgeon: Jettie Booze, MD;   Location: [place] CV [facility];  Service: Cardiovascular;  Laterality: N/A;  . NM MYOCAR PERF WALL MOTION  02/21/2010   normal  . RADIOACTIVE SEED IMPLANT  2010   prostate cancer  . THROMBECTOMY W/ EMBOLECTOMY Left 02/26/2018   Procedure: THROMBECTOMY ARTERIOVENOUS GRAFT;  Surgeon: Angelia Mould, MD;  Location: [place];  Service: Vascular;  Laterality: Left;  . ULTRASOUND GUIDANCE FOR VASCULAR ACCESS  02/20/2018   Procedure: Ultrasound Guidance For Vascular Access;  Surgeon: Jettie Booze, MD;  Location: [place] CV [facility];  Service: Cardiovascular;;  . US  ECHOCARDIOGRAPHY  02/06/2012   mild LVH,LA mod. dilated,mild-mod. 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 AI.    Social History   Socioeconomic History  . Marital status: Married    Spouse name: Not on file  . Number of children: 2  . Years of education: 59  . Highest education level: Not on file  Occupational History  . Occupation: retired, picks up Aeronautical engineer: RETIRED  Social Needs  . Financial resource strain: Not on file  . Food insecurity:    Worry: Not on file    Inability: Not on file  . Transportation needs:    Medical: Not on file    Non-medical: Not on file  Tobacco Use  . Smoking status: Former Smoker    Packs/day: 1.00    Years: 30.00    Pack years: 30.00    Types: Cigarettes    Last attempt to quit: 2006    Years since quitting: 13.3  . Smokeless tobacco: Never Used  Substance and Sexual Activity  . Alcohol use: Not Currently    Comment: once a week-wine  . Drug use: Never  . Sexual activity: Not Currently  Lifestyle  . Physical activity:    Days per week: Not on file    Minutes per session: Not on file  . Stress: Not on file  Relationships  . Social connections:    Talks on phone: Not on file    Gets together: Not on file    Attends religious service: Not on file    Active member of club or organization: Not on file    Attends meetings of clubs or  organizations: Not on file    Relationship status: Not on file  Other Topics Concern  . Not on file  Social History Narrative   ** Merged History Encounter **       Stays active at home Regular exercise: no Drinks 2 cups of coffee a week, 1 mountain dew soda a day.    Family History  Problem Relation Age of Onset  . Hypertension Mother   . Thyroid disease Mother   . Cholelithiasis Daughter   . Cholelithiasis Son   . Hypertension Maternal Grandmother   . Diabetes Maternal Grandmother   . Heart attack Neg Hx   . Hyperlipidemia Neg Hx      ROS Review of Systems See HPI Constitution: No fevers or chills No malaise No diaphoresis Skin: No rash or itching Eyes: no blurry vision, no double vision GU: no dysuria or hematuria Neuro: no dizziness or headaches all others reviewed and negative   Objective: Vitals:   04/24/18 1550  BP: 126/80  Pulse: (!) 113  Resp: 18  Temp: 98.5 F (36.9 C)  TempSrc: Oral  SpO2: 100%  Weight: 176 lb 12.8 oz (80.2 kg)  Height: 6\' 2"  (1.88 m)    Physical Exam Physical Exam  Constitutional: She is oriented to person, place, and time. She appears well-developed and well-nourished.  HENT:  Head: Normocephalic and atraumatic.  Eyes: Conjunctivae and EOM are normal.  Cardiovascular: Normal rate, regular rhythm and normal heart sounds.   Pulmonary/Chest: Effort normal and breath sounds normal. No respiratory distress. he has no wheezes.  Abdominal: Normal appearance and bowel sounds are normal. There is no tenderness. There is no CVA tenderness.  Neurological: he is alert and oriented to person, place, and time.    Assessment and Plan Bronc was seen today for new patient (initial visit).  Diagnoses and all orders for this visit:  ESRD (end stage renal disease) ([place]) -     Comprehensive metabolic panel  Nausea and vomiting in adult patient -     Cancel: Comprehensive metabolic panel -     POCT CBC -     Comprehensive metabolic  panel -     scopolamine (TRANSDERM-SCOP, 1.5 MG,) 1 MG/3DAYS; Place 1 patch (1.5 mg total) onto the skin every 3 (three) days.   Trial of scolopamine Pt to follow up with his dialysis team    [place]

## 2018-04-25 ENCOUNTER — Telehealth: Payer: Self-pay | Admitting: Family Medicine

## 2018-04-25 DIAGNOSIS — N186 End stage renal disease: Secondary | ICD-10-CM | POA: Diagnosis not present

## 2018-04-25 DIAGNOSIS — N2581 Secondary hyperparathyroidism of renal origin: Secondary | ICD-10-CM | POA: Diagnosis not present

## 2018-04-25 NOTE — Telephone Encounter (Signed)
Pt. Came into office to inform Dr. Nolon Rod that the Scopolamine Transdermal patches prescribed were not covered by insurance. The pt. Brought by a box and script that were the patches that had been prescribed to him and covered previously. To the best of my knowledge this script was Identical to the one written by doctor Nolon Rod with the exception of quantity, which was 10 patches.

## 2018-04-26 ENCOUNTER — Ambulatory Visit: Payer: Medicare HMO | Admitting: Family

## 2018-04-26 NOTE — Telephone Encounter (Signed)
Called pharmacy. Generic on back order. Brand requires a PA. Pharmacy is faxing over request.

## 2018-04-27 DIAGNOSIS — N2581 Secondary hyperparathyroidism of renal origin: Secondary | ICD-10-CM | POA: Diagnosis not present

## 2018-04-27 DIAGNOSIS — N186 End stage renal disease: Secondary | ICD-10-CM | POA: Diagnosis not present

## 2018-04-30 ENCOUNTER — Other Ambulatory Visit: Payer: Self-pay

## 2018-04-30 ENCOUNTER — Emergency Department (HOSPITAL_COMMUNITY): Payer: Medicare HMO

## 2018-04-30 ENCOUNTER — Emergency Department (HOSPITAL_COMMUNITY)
Admission: EM | Admit: 2018-04-30 | Discharge: 2018-05-01 | Disposition: A | Discharge status: Home / Self Care | Payer: Medicare HMO | Attending: Emergency Medicine | Admitting: Emergency Medicine

## 2018-04-30 DIAGNOSIS — N186 End stage renal disease: Secondary | ICD-10-CM | POA: Diagnosis not present

## 2018-04-30 DIAGNOSIS — N2581 Secondary hyperparathyroidism of renal origin: Secondary | ICD-10-CM | POA: Diagnosis not present

## 2018-04-30 DIAGNOSIS — I10 Essential (primary) hypertension: Secondary | ICD-10-CM | POA: Insufficient documentation

## 2018-04-30 DIAGNOSIS — R079 Chest pain, unspecified: Secondary | ICD-10-CM | POA: Diagnosis not present

## 2018-04-30 DIAGNOSIS — Z5321 Procedure and treatment not carried out due to patient leaving prior to being seen by health care provider: Secondary | ICD-10-CM | POA: Diagnosis not present

## 2018-04-30 LAB — BASIC METABOLIC PANEL
Anion gap: 15 (ref 5–15)
BUN: 24 mg/dL — AB (ref 6–20)
CHLORIDE: 93 mmol/L — AB (ref 101–111)
CO2: 31 mmol/L (ref 22–32)
CREATININE: 6.43 mg/dL — AB (ref 0.61–1.24)
Calcium: 9.4 mg/dL (ref 8.9–10.3)
GFR calc Af Amer: 9 mL/min — ABNORMAL LOW (ref >60)
GFR calc non Af Amer: 8 mL/min — ABNORMAL LOW (ref >60)
GLUCOSE: 114 mg/dL — AB (ref 65–99)
POTASSIUM: 4.4 mmol/L (ref 3.5–5.1)
SODIUM: 139 mmol/L (ref 135–145)

## 2018-04-30 LAB — I-STAT TROPONIN, ED: Troponin i, poc: 0.03 ng/mL (ref 0.00–0.08)

## 2018-04-30 LAB — CBC
HEMATOCRIT: 39.6 % (ref 39.0–52.0)
Hemoglobin: 12.5 g/dL — ABNORMAL LOW (ref 13.0–17.0)
MCH: 32.7 pg (ref 26.0–34.0)
MCHC: 31.6 g/dL (ref 30.0–36.0)
MCV: 103.7 fL — AB (ref 78.0–100.0)
PLATELETS: 247 10*3/uL (ref 150–400)
RBC: 3.82 MIL/uL — ABNORMAL LOW (ref 4.22–5.81)
RDW: 14.9 % (ref 11.5–15.5)
WBC: 5.6 10*3/uL (ref 4.0–10.5)

## 2018-04-30 NOTE — ED Notes (Signed)
Pt now reports CP, taken to triage for re-eval. EKG obtained along with blood work.

## 2018-04-30 NOTE — ED Triage Notes (Signed)
Pt reports he feels like his BP is high, noted to be 124/79 in triage. Pt has no complaints.

## 2018-05-01 ENCOUNTER — Telehealth: Payer: Self-pay | Admitting: Family Medicine

## 2018-05-01 ENCOUNTER — Other Ambulatory Visit: Payer: Self-pay | Admitting: Family

## 2018-05-01 ENCOUNTER — Telehealth: Payer: Self-pay

## 2018-05-01 DIAGNOSIS — R112 Nausea with vomiting, unspecified: Secondary | ICD-10-CM

## 2018-05-01 NOTE — ED Notes (Signed)
Patient requesting to know wait time, this RN advised patient where he is in line, pt stated "I think I am going to leave, I've been here for a long time." this RN apologized for patient's wait and made patient aware we are working to get patients back to see a provider as soon as we can. Pt encouraged to stay and be seen by a doctor. Pt agreed to continue to wait.

## 2018-05-01 NOTE — Telephone Encounter (Signed)
ED follow up appointment scheduled.

## 2018-05-01 NOTE — Telephone Encounter (Addendum)
Pt given information per Dr Carman Ching, "Please check to see if the patient has been able to eat. Let him know his lab was at the baseline for him"; the pt states that he did eat a little this morning and took his medication but he "threw up"; the pt also states that he needs some more nausea pills; instructed pt to contact his pharmacy for refill requests; he verbalizes understanding; will route this to office for notification of this encounter.; also see CRM # [id number].

## 2018-05-01 NOTE — ED Notes (Signed)
Follow up call made  Pt will follow up w pcp  1045  05/01/18  s  Jon rn

## 2018-05-01 NOTE — Telephone Encounter (Signed)
Pt to see [name] on 6/3 f/u ED visit for HTN. Limited supply of zofran reordered until 6/3 visit.

## 2018-05-01 NOTE — ED Notes (Signed)
Gave patient an emesis bag, he has voided aprox. 50cc into the bag.

## 2018-05-02 DIAGNOSIS — N2581 Secondary hyperparathyroidism of renal origin: Secondary | ICD-10-CM | POA: Diagnosis not present

## 2018-05-02 DIAGNOSIS — N186 End stage renal disease: Secondary | ICD-10-CM | POA: Diagnosis not present

## 2018-05-03 DIAGNOSIS — N186 End stage renal disease: Secondary | ICD-10-CM | POA: Diagnosis not present

## 2018-05-03 DIAGNOSIS — Z992 Dependence on renal dialysis: Secondary | ICD-10-CM | POA: Diagnosis not present

## 2018-05-03 DIAGNOSIS — B20 Human immunodeficiency virus [HIV] disease: Secondary | ICD-10-CM | POA: Diagnosis not present

## 2018-05-04 DIAGNOSIS — N2581 Secondary hyperparathyroidism of renal origin: Secondary | ICD-10-CM | POA: Diagnosis not present

## 2018-05-04 DIAGNOSIS — N186 End stage renal disease: Secondary | ICD-10-CM | POA: Diagnosis not present

## 2018-05-06 ENCOUNTER — Ambulatory Visit (INDEPENDENT_AMBULATORY_CARE_PROVIDER_SITE_OTHER): Payer: Medicare HMO | Admitting: Family

## 2018-05-06 VITALS — BP 129/72 | HR 85 | Temp 98.1°F | Resp 16 | Ht 74.0 in | Wt 181.2 lb

## 2018-05-06 DIAGNOSIS — K5900 Constipation, unspecified: Secondary | ICD-10-CM

## 2018-05-06 NOTE — Progress Notes (Signed)
Subjective:    Patient ID: David [name], male    DOB: [birth year], 77 y.o.   MRN: [id number]  HPI   David [name] is a 77 yr old male who presents today for ER follow up.  Patient presented to the emergency department on Apr 30, 2018.  He did not stay to be evaluated by the provider.  He reported chest pain during that visit and EKG was performed.  EKG noted normal sinus rhythm.  No acute changes were noted.  He did have a stat troponin drawn which was negative.  He was mildly anemic with a hemoglobin of 12.5.  His basic metabolic panel was at baseline.   Reports that he moved his bowels over the weekend. Reports feeling much better and nausea/vomitting symptoms have resolved.    Review of Systems See HPI  Past Medical History:  Diagnosis Date  . Acute on chronic systolic and diastolic heart failure, NYHA class 4 ([place])   . Anemia, iron deficiency 11/15/2011  . Arthritis   . Arthritis    "hands, right knee, feet" (02/21/2018)  . Cancer [facility])    hx of prostate; s/p radioactive seed implant 10/2009 Dr Janice Norrie  . Cardiac arrest ([place]) 02/17/2018  . CHF (congestive heart failure) ([place])   . Chronic kidney disease   . Chronic lower back pain   . CKD (chronic kidney disease) stage V requiring chronic dialysis ([place])   . ESRD on dialysis ([place])    started 02/2018  . Glaucoma   . Gout    daily RX (02/21/2018)  . Heart murmur    "mild" per pt  . History of cardiac cath 2004   negative for CAD  . History of myocardial perfusion scan 02/2010   negative for coronary insufficiency (LVEF 27%)  . HIV (human immunodeficiency virus infection) ([place])   . HIV infection ([place])   . Hyperlipidemia   . Hypertension    followed by [facility] and Vascular (Dr Dani Gobble Croitoru)  . Hypertension   . Nonischemic cardiomyopathy ([place])   . Pneumonia 11/2017  . Prostate cancer ([place])   . Sinus bradycardia   . Stroke [facility])    "mini stroke" years ago  . Systolic and diastolic CHF, acute ([place]) 06/2010   felt to be secondary to hypertensive cardiomyopathy     Social History   Socioeconomic History  . Marital status: Married    Spouse name: Not on file  . Number of children: 2  . Years of education: 56  . Highest education level: Not on file  Occupational History  . Occupation: retired, picks up Aeronautical engineer: RETIRED  Social Needs  . Financial resource strain: Not on file  . Food insecurity:    Worry: Not on file    Inability: Not on file  . Transportation needs:    Medical: Not on file    Non-medical: Not on file  Tobacco Use  . Smoking status: Former Smoker    Packs/day: 1.00    Years: 30.00    Pack years: 30.00    Types: Cigarettes    Last attempt to quit: 2006    Years since quitting: 13.4  . Smokeless tobacco: Never Used  Substance and Sexual Activity  . Alcohol use: Not Currently    Comment: once a week-wine  . Drug use: Never  . Sexual activity: Not Currently  Lifestyle  . Physical activity:    Days per week: Not on file    Minutes per  session: Not on file  . Stress: Not on file  Relationships  . Social connections:    Talks on phone: Not on file    Gets together: Not on file    Attends religious service: Not on file    Active member of club or organization: Not on file    Attends meetings of clubs or organizations: Not on file    Relationship status: Not on file  . Intimate partner violence:    Fear of current or ex partner: Not on file    Emotionally abused: Not on file    Physically abused: Not on file    Forced sexual activity: Not on file  Other Topics Concern  . Not on file  Social History Narrative   ** Merged History Encounter **       Stays active at home Regular exercise: no Drinks 2 cups of coffee a week, 1 mountain dew soda a day.    Past Surgical History:  Procedure Laterality Date  . AV FISTULA PLACEMENT Right 10/13/2016   Procedure: ARTERIOVENOUS (AV) FISTULA CREATION;  Surgeon: [name], MD;  Location: [place];   Service: Vascular;  Laterality: Right;  . AV FISTULA PLACEMENT Right 10/13/2016   [name] [phone]  . AV FISTULA PLACEMENT Left 02/25/2018   Procedure: INSERTION OF ARTERIOVENOUS (AV) GORE-TEX GRAFT LEFT UPPER ARM;  Surgeon: Angelia Mould, MD;  Location: [place];  Service: Vascular;  Laterality: Left;  . BASCILIC VEIN TRANSPOSITION Left 06/24/2015   Procedure: BASILIC VEIN TRANSPOSITION;  Surgeon: Mal Misty, MD;  Location: [place];  Service: Vascular;  Laterality: Left;  . BASCILIC VEIN TRANSPOSITION Left 0/24/0973 Bascilic vein transposition (Left)   [name] [phone]  . CARDIAC CATHETERIZATION  02/04/2003   mildly depressed LV systolic fx EF 83%,[id number] coronaries/abdominal aorta/renal arteries.  Marland Kitchen CARDIAC CATHETERIZATION  2004  . CATARACT EXTRACTION, BILATERAL Bilateral   . COLONOSCOPY    . ESOPHAGOGASTRODUODENOSCOPY (EGD) WITH PROPOFOL N/A 05/05/2014   Procedure: ESOPHAGOGASTRODUODENOSCOPY (EGD) WITH PROPOFOL;  Surgeon: Missy Sabins, MD;  [facility];  Service: Endoscopy;  Laterality: N/A;  . EYE SURGERY Bilateral    cataract surgery   . EYE SURGERY Bilateral    glaucoma surgery  . EYE SURGERY    . GLAUCOMA SURGERY Bilateral   . INSERTION PROSTATE RADIATION SEED    . IR FLUORO GUIDE CV LINE RIGHT  02/18/2018  . IR US GUIDE VASC ACCESS RIGHT  02/18/2018  . LEFT HEART CATH AND CORONARY ANGIOGRAPHY N/A 02/20/2018   Procedure: LEFT HEART CATH AND CORONARY ANGIOGRAPHY;  Surgeon: Jettie Booze, MD;  Location: [place] CV LAB;  Service: Cardiovascular;  Laterality: N/A;  . NM MYOCAR PERF WALL MOTION  02/21/2010   normal  . RADIOACTIVE SEED IMPLANT  2010   prostate cancer  . THROMBECTOMY W/ EMBOLECTOMY Left 02/26/2018   Procedure: THROMBECTOMY ARTERIOVENOUS GRAFT;  Surgeon: Angelia Mould, MD;  Location: [place];  Service: Vascular;  Laterality: Left;  . ULTRASOUND GUIDANCE FOR VASCULAR ACCESS  02/20/2018   Procedure: Ultrasound Guidance For Vascular Access;  Surgeon:  Jettie Booze, MD;  Location: [place] CV LAB;  Service: Cardiovascular;;  . US ECHOCARDIOGRAPHY  02/06/2012   mild LVH,LA mod. dilated,mild-mod. 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 AI.    Family History  Problem Relation Age of Onset  . Hypertension Mother   . Thyroid disease Mother   . Cholelithiasis Daughter   . Cholelithiasis Son   .  Hypertension Maternal Grandmother   . Diabetes Maternal Grandmother   . Heart attack Neg Hx   . Hyperlipidemia Neg Hx     Allergies  Allergen Reactions  . Codeine Other (See Comments)    Causes constipation  . Cozaar [Losartan Potassium] Other (See Comments)    Causes constipation     Current Outpatient Medications on File Prior to Visit  Medication Sig Dispense Refill  . aspirin EC 81 MG tablet Take 81 mg by mouth daily.    . brimonidine (ALPHAGAN) 0.15 % ophthalmic solution Place 1 drop into the left eye 2 (two) times daily.  3  . colchicine 0.6 MG tablet Take 0.5 tablets (0.3 mg total) by mouth 2 (two) times a week. 8 tablet 5  . DESCOVY 200-25 MG tablet Take 1 tablet by mouth every morning.  11  . dorzolamide-timolol (COSOPT) 22.3-6.8 MG/ML ophthalmic solution Place 1 drop into the left eye 2 (two) times daily.    Marland Kitchen latanoprost (XALATAN) 0.005 % ophthalmic solution Place 1 drop into the left eye at bedtime.     . ondansetron (ZOFRAN) 4 MG tablet TAKE 1 TABLET(4 MG) BY MOUTH EVERY 8 HOURS AS NEEDED FOR NAUSEA OR VOMITING 20 tablet 0  . polyethylene glycol (MIRALAX / GLYCOLAX) packet Take 17 g by mouth daily as needed for mild constipation. 14 each 0  . TIVICAY 50 MG tablet Take 50 mg by mouth every morning.  11  . Vitamin D, Ergocalciferol, (DRISDOL) 50000 UNITS CAPS capsule Take 50,000 units by mouth once a week on Saturdays  2  . amiodarone (PACERONE) 200 MG tablet Take 1 tablet (200 mg total) by mouth daily. 30 tablet 1  . warfarin (COUMADIN) 5 MG tablet Take 1 tablet (5 mg total) by mouth daily at 6  PM. 30 tablet 1   No current facility-administered medications on file prior to visit.     BP 129/72 (BP Location: Left Arm, Cuff Size: Normal)   Pulse 85   Temp 98.1 F (36.7 C) (Oral)   Resp 16   Ht 6\' 2"  (1.88 m)   Wt 181 lb 3.2 oz (82.2 kg)   SpO2 100%   BMI 23.26 kg/m   So he was Way today.  Reports that on Saturday he was given an rx for senokot.  Has been taking a fiber supplement. He is is using 2 teaspoons.  He denies cp or sob.    Past Medical History:  Diagnosis Date  . Acute on chronic systolic and diastolic heart failure, NYHA class 4 ([place])   . Anemia, iron deficiency 11/15/2011  . Arthritis   . Arthritis    "hands, right knee, feet" (02/21/2018)  . Cancer [name])    hx of prostate; s/p radioactive seed implant 10/2009 Dr Janice Norrie  . Cardiac arrest ([place]) 02/17/2018  . CHF (congestive heart failure) ([place])   . Chronic kidney disease   . Chronic lower back pain   . CKD (chronic kidney disease) stage V requiring chronic dialysis ([place])   . ESRD on dialysis ([employer])    started 02/2018  . Glaucoma   . Gout    daily RX (02/21/2018)  . Heart murmur    "mild" per pt  . History of cardiac cath 2004   negative for CAD  . History of myocardial perfusion scan 02/2010   negative for coronary insufficiency (LVEF 27%)  . HIV (human immunodeficiency virus infection) ([place])   . HIV infection ([place])   . Hyperlipidemia   . Hypertension  followed by [facility] Vascular (Dr Dani Gobble Croitoru)  . Hypertension   . Nonischemic cardiomyopathy ([place])   . Pneumonia 11/2017  . Prostate cancer ([place])   . Sinus bradycardia   . Stroke [facility])    "mini stroke" years ago  . Systolic and diastolic CHF, acute ([place]) 06/2010   felt to be secondary to hypertensive cardiomyopathy     Social History   Socioeconomic History  . Marital status: Married    Spouse name: Not on file  . Number of children: 2  . Years of education: 57  . Highest education level: Not on file  Occupational  History  . Occupation: retired, picks up Aeronautical engineer: RETIRED  Social Needs  . Financial resource strain: Not on file  . Food insecurity:    Worry: Not on file    Inability: Not on file  . Transportation needs:    Medical: Not on file    Non-medical: Not on file  Tobacco Use  . Smoking status: Former Smoker    Packs/day: 1.00    Years: 30.00    Pack years: 30.00    Types: Cigarettes    Last attempt to quit: 2006    Years since quitting: 13.4  . Smokeless tobacco: Never Used  Substance and Sexual Activity  . Alcohol use: Not Currently    Comment: once a week-wine  . Drug use: Never  . Sexual activity: Not Currently  Lifestyle  . Physical activity:    Days per week: Not on file    Minutes per session: Not on file  . Stress: Not on file  Relationships  . Social connections:    Talks on phone: Not on file    Gets together: Not on file    Attends religious service: Not on file    Active member of club or organization: Not on file    Attends meetings of clubs or organizations: Not on file    Relationship status: Not on file  . Intimate partner violence:    Fear of current or ex partner: Not on file    Emotionally abused: Not on file    Physically abused: Not on file    Forced sexual activity: Not on file  Other Topics Concern  . Not on file  Social History Narrative   ** Merged History Encounter **       Stays active at home Regular exercise: no Drinks 2 cups of coffee a week, 1 mountain dew soda a day.    Past Surgical History:  Procedure Laterality Date  . AV FISTULA PLACEMENT Right 10/13/2016   Procedure: ARTERIOVENOUS (AV) FISTULA CREATION;  Surgeon: [name], MD;  Location: [place];  Service: Vascular;  Laterality: Right;  . AV FISTULA PLACEMENT Right 10/13/2016   [name] [phone]  . AV FISTULA PLACEMENT Left 02/25/2018   Procedure: INSERTION OF ARTERIOVENOUS (AV) GORE-TEX GRAFT LEFT UPPER ARM;  Surgeon: Angelia Mould, MD;  Location: [place];  Service: Vascular;  Laterality: Left;  . BASCILIC VEIN TRANSPOSITION Left 06/24/2015   Procedure: BASILIC VEIN TRANSPOSITION;  Surgeon: Mal Misty, MD;  Location: [place];  Service: Vascular;  Laterality: Left;  . BASCILIC VEIN TRANSPOSITION Left 08/27/2682 Bascilic vein transposition (Left)   [name] [id number]  . CARDIAC CATHETERIZATION  02/04/2003   mildly depressed LV systolic fx EF 98%,[id number] coronaries/abdominal aorta/renal arteries.  Marland Kitchen CARDIAC CATHETERIZATION  2004  . CATARACT EXTRACTION, BILATERAL Bilateral   . COLONOSCOPY    .  ESOPHAGOGASTRODUODENOSCOPY (EGD) WITH PROPOFOL N/A 05/05/2014   Procedure: ESOPHAGOGASTRODUODENOSCOPY (EGD) WITH PROPOFOL;  Surgeon: Missy Sabins, MD;  [facility];  Service: Endoscopy;  Laterality: N/A;  . EYE SURGERY Bilateral    cataract surgery   . EYE SURGERY Bilateral    glaucoma surgery  . EYE SURGERY    . GLAUCOMA SURGERY Bilateral   . INSERTION PROSTATE RADIATION SEED    . IR FLUORO GUIDE CV LINE RIGHT  02/18/2018  . IR US GUIDE VASC ACCESS RIGHT  02/18/2018  . LEFT HEART CATH AND CORONARY ANGIOGRAPHY N/A 02/20/2018   Procedure: LEFT HEART CATH AND CORONARY ANGIOGRAPHY;  Surgeon: Jettie Booze, MD;  Location: [place] CV LAB;  Service: Cardiovascular;  Laterality: N/A;  . NM MYOCAR PERF WALL MOTION  02/21/2010   normal  . RADIOACTIVE SEED IMPLANT  2010   prostate cancer  . THROMBECTOMY W/ EMBOLECTOMY Left 02/26/2018   Procedure: THROMBECTOMY ARTERIOVENOUS GRAFT;  Surgeon: Angelia Mould, MD;  Location: [place];  Service: Vascular;  Laterality: Left;  . ULTRASOUND GUIDANCE FOR VASCULAR ACCESS  02/20/2018   Procedure: Ultrasound Guidance For Vascular Access;  Surgeon: Jettie Booze, MD;  Location: [place] CV LAB;  Service: Cardiovascular;;  . US ECHOCARDIOGRAPHY  02/06/2012   mild LVH,LA mod. dilated,mild-mod. 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 AI.    Family History  Problem Relation  Age of Onset  . Hypertension Mother   . Thyroid disease Mother   . Cholelithiasis Daughter   . Cholelithiasis Son   . Hypertension Maternal Grandmother   . Diabetes Maternal Grandmother   . Heart attack Neg Hx   . Hyperlipidemia Neg Hx     Allergies  Allergen Reactions  . Codeine Other (See Comments)    Causes constipation  . Cozaar [Losartan Potassium] Other (See Comments)    Causes constipation     Current Outpatient Medications on File Prior to Visit  Medication Sig Dispense Refill  . aspirin EC 81 MG tablet Take 81 mg by mouth daily.    . brimonidine (ALPHAGAN) 0.15 % ophthalmic solution Place 1 drop into the left eye 2 (two) times daily.  3  . colchicine 0.6 MG tablet Take 0.5 tablets (0.3 mg total) by mouth 2 (two) times a week. 8 tablet 5  . DESCOVY 200-25 MG tablet Take 1 tablet by mouth every morning.  11  . dorzolamide-timolol (COSOPT) 22.3-6.8 MG/ML ophthalmic solution Place 1 drop into the left eye 2 (two) times daily.    Marland Kitchen latanoprost (XALATAN) 0.005 % ophthalmic solution Place 1 drop into the left eye at bedtime.     . ondansetron (ZOFRAN) 4 MG tablet TAKE 1 TABLET(4 MG) BY MOUTH EVERY 8 HOURS AS NEEDED FOR NAUSEA OR VOMITING 20 tablet 0  . polyethylene glycol (MIRALAX / GLYCOLAX) packet Take 17 g by mouth daily as needed for mild constipation. 14 each 0  . TIVICAY 50 MG tablet Take 50 mg by mouth every morning.  11  . Vitamin D, Ergocalciferol, (DRISDOL) 50000 UNITS CAPS capsule Take 50,000 units by mouth once a week on Saturdays  2  . amiodarone (PACERONE) 200 MG tablet Take 1 tablet (200 mg total) by mouth daily. 30 tablet 1  . warfarin (COUMADIN) 5 MG tablet Take 1 tablet (5 mg total) by mouth daily at 6 PM. 30 tablet 1   No current facility-administered medications on file prior to visit.     BP 129/72 (BP Location: Left Arm,  Cuff Size: Normal)   Pulse 85   Temp 98.1 F (36.7 C) (Oral)   Resp 16   Ht 6\' 2"  (1.88 m)   Wt 181 lb 3.2 oz (82.2 kg)   SpO2  100%   BMI 23.26 kg/m       Objective:   Physical Exam  Constitutional: He is oriented to person, place, and time. He appears well-developed and well-nourished. No distress.  HENT:  Head: Normocephalic and atraumatic.  Cardiovascular: Normal rate and regular rhythm.  No murmur heard. Pulmonary/Chest: Effort normal and breath sounds normal. No respiratory distress. He has no wheezes. He has no rales.  Musculoskeletal: He exhibits no edema.  Neurological: He is alert and oriented to person, place, and time.  Skin: Skin is warm and dry.  Psychiatric: He has a normal mood and affect. His behavior is normal. Thought content normal.          Assessment & Plan:  Constipation- stable with addition of fiber supplement. Continue same. Advised pt to let us know if recurrent constipation symptoms, nausea/vomitting.

## 2018-05-06 NOTE — Patient Instructions (Signed)
Please continue fiber supplement daily.  Call if you develop recurrent constipation or nausea/vomitting.

## 2018-05-07 DIAGNOSIS — N186 End stage renal disease: Secondary | ICD-10-CM | POA: Diagnosis not present

## 2018-05-07 DIAGNOSIS — N2581 Secondary hyperparathyroidism of renal origin: Secondary | ICD-10-CM | POA: Diagnosis not present

## 2018-05-09 DIAGNOSIS — N2581 Secondary hyperparathyroidism of renal origin: Secondary | ICD-10-CM | POA: Diagnosis not present

## 2018-05-09 DIAGNOSIS — N186 End stage renal disease: Secondary | ICD-10-CM | POA: Diagnosis not present

## 2018-05-09 LAB — PROTIME-INR: INR: 5.1 — AB (ref 0.9–1.1)

## 2018-05-11 DIAGNOSIS — N186 End stage renal disease: Secondary | ICD-10-CM | POA: Diagnosis not present

## 2018-05-11 DIAGNOSIS — N2581 Secondary hyperparathyroidism of renal origin: Secondary | ICD-10-CM | POA: Diagnosis not present

## 2018-05-13 ENCOUNTER — Ambulatory Visit (INDEPENDENT_AMBULATORY_CARE_PROVIDER_SITE_OTHER): Payer: Medicare HMO | Admitting: Pharmacist

## 2018-05-13 DIAGNOSIS — Z961 Presence of intraocular lens: Secondary | ICD-10-CM | POA: Diagnosis not present

## 2018-05-13 DIAGNOSIS — Z5181 Encounter for therapeutic drug level monitoring: Secondary | ICD-10-CM

## 2018-05-13 DIAGNOSIS — H401133 Primary open-angle glaucoma, bilateral, severe stage: Secondary | ICD-10-CM | POA: Diagnosis not present

## 2018-05-13 DIAGNOSIS — I4891 Unspecified atrial fibrillation: Secondary | ICD-10-CM

## 2018-05-14 DIAGNOSIS — N186 End stage renal disease: Secondary | ICD-10-CM | POA: Diagnosis not present

## 2018-05-14 DIAGNOSIS — N2581 Secondary hyperparathyroidism of renal origin: Secondary | ICD-10-CM | POA: Diagnosis not present

## 2018-05-16 DIAGNOSIS — N186 End stage renal disease: Secondary | ICD-10-CM | POA: Diagnosis not present

## 2018-05-16 DIAGNOSIS — N2581 Secondary hyperparathyroidism of renal origin: Secondary | ICD-10-CM | POA: Diagnosis not present

## 2018-05-18 DIAGNOSIS — N2581 Secondary hyperparathyroidism of renal origin: Secondary | ICD-10-CM | POA: Diagnosis not present

## 2018-05-18 DIAGNOSIS — N186 End stage renal disease: Secondary | ICD-10-CM | POA: Diagnosis not present

## 2018-05-21 DIAGNOSIS — N186 End stage renal disease: Secondary | ICD-10-CM | POA: Diagnosis not present

## 2018-05-21 DIAGNOSIS — N2581 Secondary hyperparathyroidism of renal origin: Secondary | ICD-10-CM | POA: Diagnosis not present

## 2018-05-23 DIAGNOSIS — N2581 Secondary hyperparathyroidism of renal origin: Secondary | ICD-10-CM | POA: Diagnosis not present

## 2018-05-23 DIAGNOSIS — N186 End stage renal disease: Secondary | ICD-10-CM | POA: Diagnosis not present

## 2018-05-25 DIAGNOSIS — N2581 Secondary hyperparathyroidism of renal origin: Secondary | ICD-10-CM | POA: Diagnosis not present

## 2018-05-25 DIAGNOSIS — N186 End stage renal disease: Secondary | ICD-10-CM | POA: Diagnosis not present

## 2018-05-28 DIAGNOSIS — N2581 Secondary hyperparathyroidism of renal origin: Secondary | ICD-10-CM | POA: Diagnosis not present

## 2018-05-28 DIAGNOSIS — N186 End stage renal disease: Secondary | ICD-10-CM | POA: Diagnosis not present

## 2018-05-30 DIAGNOSIS — N186 End stage renal disease: Secondary | ICD-10-CM | POA: Diagnosis not present

## 2018-05-30 DIAGNOSIS — N2581 Secondary hyperparathyroidism of renal origin: Secondary | ICD-10-CM | POA: Diagnosis not present

## 2018-06-01 DIAGNOSIS — N186 End stage renal disease: Secondary | ICD-10-CM | POA: Diagnosis not present

## 2018-06-01 DIAGNOSIS — N2581 Secondary hyperparathyroidism of renal origin: Secondary | ICD-10-CM | POA: Diagnosis not present

## 2018-06-02 DIAGNOSIS — B20 Human immunodeficiency virus [HIV] disease: Secondary | ICD-10-CM | POA: Diagnosis not present

## 2018-06-02 DIAGNOSIS — Z992 Dependence on renal dialysis: Secondary | ICD-10-CM | POA: Diagnosis not present

## 2018-06-02 DIAGNOSIS — N186 End stage renal disease: Secondary | ICD-10-CM | POA: Diagnosis not present

## 2018-06-04 DIAGNOSIS — N2581 Secondary hyperparathyroidism of renal origin: Secondary | ICD-10-CM | POA: Diagnosis not present

## 2018-06-04 DIAGNOSIS — N186 End stage renal disease: Secondary | ICD-10-CM | POA: Diagnosis not present

## 2018-06-05 ENCOUNTER — Other Ambulatory Visit: Payer: Self-pay | Admitting: Cardiovascular Disease

## 2018-06-06 DIAGNOSIS — N2581 Secondary hyperparathyroidism of renal origin: Secondary | ICD-10-CM | POA: Diagnosis not present

## 2018-06-06 DIAGNOSIS — N186 End stage renal disease: Secondary | ICD-10-CM | POA: Diagnosis not present

## 2018-06-06 LAB — PROTIME-INR: INR: 3 — AB (ref <=1.1)

## 2018-06-08 DIAGNOSIS — N186 End stage renal disease: Secondary | ICD-10-CM | POA: Diagnosis not present

## 2018-06-08 DIAGNOSIS — N2581 Secondary hyperparathyroidism of renal origin: Secondary | ICD-10-CM | POA: Diagnosis not present

## 2018-06-10 ENCOUNTER — Encounter: Payer: Self-pay | Admitting: Cardiovascular Disease

## 2018-06-10 ENCOUNTER — Ambulatory Visit (INDEPENDENT_AMBULATORY_CARE_PROVIDER_SITE_OTHER): Payer: Self-pay | Admitting: Pharmacist Clinician (PhC)/ Clinical Pharmacy Specialist

## 2018-06-10 ENCOUNTER — Ambulatory Visit: Payer: Medicare HMO | Admitting: Cardiovascular Disease

## 2018-06-10 VITALS — BP 122/70 | HR 77 | Ht 74.0 in | Wt 190.4 lb

## 2018-06-10 DIAGNOSIS — I4891 Unspecified atrial fibrillation: Secondary | ICD-10-CM

## 2018-06-10 DIAGNOSIS — I10 Essential (primary) hypertension: Secondary | ICD-10-CM

## 2018-06-10 DIAGNOSIS — Z21 Asymptomatic human immunodeficiency virus [HIV] infection status: Secondary | ICD-10-CM

## 2018-06-10 DIAGNOSIS — I447 Left bundle-branch block, unspecified: Secondary | ICD-10-CM

## 2018-06-10 DIAGNOSIS — N186 End stage renal disease: Secondary | ICD-10-CM

## 2018-06-10 DIAGNOSIS — I5042 Chronic combined systolic (congestive) and diastolic (congestive) heart failure: Secondary | ICD-10-CM

## 2018-06-10 DIAGNOSIS — Z5181 Encounter for therapeutic drug level monitoring: Secondary | ICD-10-CM

## 2018-06-10 DIAGNOSIS — I480 Paroxysmal atrial fibrillation: Secondary | ICD-10-CM

## 2018-06-10 MED ORDER — CARVEDILOL 3.125 MG PO TABS: 3.1250 mg | ORAL_TABLET | Freq: Two times a day (BID) | ORAL | 3 refills | Status: DC

## 2018-06-10 NOTE — Patient Instructions (Signed)
Dr Sallyanne Kuster has recommended making the following medication changes: 1. START Carvedilol 3.125 twice daily >>ON DIALYSIS DAYS (Tuesdays, Thursdays, Saturdays) - take the [place]  Your physician recommends that you schedule a follow-up appointment in 1 month with Kerin Ransom, PA-C.  Dr Sallyanne Kuster recommends that you schedule a follow-up appointment in 6 months.  If you need a refill on your cardiac medications before your next appointment, please call your pharmacy.

## 2018-06-10 NOTE — Progress Notes (Signed)
Patient ID: David [name], male   DOB: [birth year], 77 y.o.   MRN: [id number]    Cardiology Office Note    Date:  06/10/2018   ID:  [address] [place], [place] Aug 16, 1941, MRN [id number]  PCP:  [name], NP  Cardiologist:   [name], MD   No chief complaint on file.   History of Present Illness:  David [name] is a 77 y.o. male with CHF (systolic and diastolic), PAFib, recent PEA arrest, nonischemic dilated cardiomyopathy and ESRD.  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  Most recently his ejection fraction has been estimated to be 15-20%.  Coronary angiography after his cardiac arrest showed only minor atherosclerosis without significant stenotic lesions.  He has an atypical left bundle branch block.   He is HIV positive, with undetectable viral load on antiretroviral therapy, monitored by Dr. Tommy Medal.  He is on hemodialysis (TTS) since his cardiac arrest in March.  Two previous attempts at placing AV fistula access points have failed.  He is getting dialysis via a tunneled right subclavian catheter.  Despite a long history of hypertension, ever since the initiation of hemodialysis he has not tolerated any medications for hypertension or heart failure.  He tells me that recently hemodialysis has been going without any hitches, he has not had to interrupt dialysis sessions, but frequently his blood pressure will be in the 80s at the end of the dialysis session.  When he wakes up in the morning before dialysis his blood pressure is usually in the 120s.  Not had any episodes of syncope.  He denies orthopnea, PND or leg edema.  He is not aware of any palpitations.  He has never had problems with chest pain.  Recent symptoms of a gout attack.  Past Medical  History:  Diagnosis Date  . Acute on chronic systolic and diastolic heart failure, NYHA class 4 ([place])   . Anemia, iron deficiency 11/15/2011  . Arthritis   . Arthritis    "hands, right knee, feet" (02/21/2018)  . Cancer [facility])    hx of prostate; s/p radioactive seed implant 10/2009 Dr Janice Norrie  . Cardiac arrest ([place]) 02/17/2018  . CHF (congestive heart failure) ([place])   . Chronic kidney disease   . Chronic lower back pain   . CKD (chronic kidney disease) stage V requiring chronic dialysis ([place])   . ESRD on dialysis ([place])    started 02/2018  . Glaucoma   . Gout    daily RX (02/21/2018)  . Heart murmur    "mild" per pt  . History of cardiac cath 2004   negative for CAD  . History of myocardial perfusion scan 02/2010   negative for coronary insufficiency (LVEF 27%)  . HIV (human immunodeficiency virus infection) ([place])   . HIV infection ([place])   . Hyperlipidemia   . Hypertension    followed by [facility] and Vascular (Dr Dani Gobble Cathleen Yagi)  . Hypertension   . Nonischemic cardiomyopathy ([place])   . Pneumonia 11/2017  . Prostate cancer ([place])   . Sinus bradycardia   . Stroke [name])    "mini stroke" years ago  . Systolic and diastolic CHF, acute ([place]) 06/2010   felt to be secondary to hypertensive cardiomyopathy    Past Surgical History:  Procedure Laterality Date  . AV FISTULA PLACEMENT  Right 10/13/2016   Procedure: ARTERIOVENOUS (AV) FISTULA CREATION;  Surgeon: [name], MD;  Location: [place];  Service: Vascular;  Laterality: Right;  . AV FISTULA PLACEMENT Right 10/13/2016   [name] [phone]  . AV FISTULA PLACEMENT Left 02/25/2018   Procedure: INSERTION OF ARTERIOVENOUS (AV) GORE-TEX GRAFT LEFT UPPER ARM;  Surgeon: Angelia Mould, MD;  Location: [place];  Service: Vascular;  Laterality: Left;  . BASCILIC VEIN TRANSPOSITION Left 06/24/2015   Procedure: BASILIC VEIN TRANSPOSITION;  Surgeon: Mal Misty, MD;  Location: [place];  Service: Vascular;  Laterality: Left;  . BASCILIC  VEIN TRANSPOSITION Left 1/50/5697 Bascilic vein transposition (Left)   [name] [phone]  . CARDIAC CATHETERIZATION  02/04/2003   mildly depressed LV systolic fx EF 74%,MOLMBE coronaries/abdominal aorta/renal arteries.  Marland Kitchen CARDIAC CATHETERIZATION  2004  . CATARACT EXTRACTION, BILATERAL Bilateral   . COLONOSCOPY    . ESOPHAGOGASTRODUODENOSCOPY (EGD) WITH PROPOFOL N/A 05/05/2014   Procedure: ESOPHAGOGASTRODUODENOSCOPY (EGD) WITH PROPOFOL;  Surgeon: Missy Sabins, MD;  Location: [facility];  Service: Endoscopy;  Laterality: N/A;  . EYE SURGERY Bilateral    cataract surgery   . EYE SURGERY Bilateral    glaucoma surgery  . EYE SURGERY    . GLAUCOMA SURGERY Bilateral   . INSERTION PROSTATE RADIATION SEED    . IR FLUORO GUIDE CV LINE RIGHT  02/18/2018  . IR US GUIDE VASC ACCESS RIGHT  02/18/2018  . LEFT HEART CATH AND CORONARY ANGIOGRAPHY N/A 02/20/2018   Procedure: LEFT HEART CATH AND CORONARY ANGIOGRAPHY;  Surgeon: Jettie Booze, MD;  Location: [place] CV LAB;  Service: Cardiovascular;  Laterality: N/A;  . NM MYOCAR PERF WALL MOTION  02/21/2010   normal  . RADIOACTIVE SEED IMPLANT  2010   prostate cancer  . THROMBECTOMY W/ EMBOLECTOMY Left 02/26/2018   Procedure: THROMBECTOMY ARTERIOVENOUS GRAFT;  Surgeon: Angelia Mould, MD;  Location: [place];  Service: Vascular;  Laterality: Left;  . ULTRASOUND GUIDANCE FOR VASCULAR ACCESS  02/20/2018   Procedure: Ultrasound Guidance For Vascular Access;  Surgeon: Jettie Booze, MD;  Location: [place] CV LAB;  Service: Cardiovascular;;  . US ECHOCARDIOGRAPHY  02/06/2012   mild LVH,LA mod. dilated,mild-mod. 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 AI.    Current Outpatient Medications  Medication Sig Dispense Refill  . amiodarone (PACERONE) 200 MG tablet TAKE 1 TABLET(200 MG) BY MOUTH DAILY 30 tablet 4  . aspirin EC 81 MG tablet Take 81 mg by mouth daily.    . brimonidine (ALPHAGAN) 0.15 % ophthalmic solution  Place 1 drop into the left eye 2 (two) times daily.  3  . DESCOVY 200-25 MG tablet Take 1 tablet by mouth every morning.  11  . dorzolamide-timolol (COSOPT) 22.3-6.8 MG/ML ophthalmic solution Place 1 drop into the left eye 2 (two) times daily.    Marland Kitchen latanoprost (XALATAN) 0.005 % ophthalmic solution Place 1 drop into the left eye at bedtime.     . Multiple Vitamins-Minerals (ONE DAILY MENS 50+ MULTIVIT) TABS Take 1 tablet by mouth daily.    . polyethylene glycol (MIRALAX / GLYCOLAX) packet Take 17 g by mouth daily as needed for mild constipation. 14 each 0  . TIVICAY 50 MG tablet Take 50 mg by mouth every morning.  11  . Vitamin D, Ergocalciferol, (DRISDOL) 50000 UNITS CAPS capsule Take 50,000 units by mouth once a week on Saturdays  2  . warfarin (COUMADIN) 5 MG tablet TAKE 1 TABLET(5 MG) BY MOUTH DAILY  AT 6 PM 30 tablet 0  . carvedilol (COREG) 3.125 MG tablet Take 1 tablet (3.125 mg total) by mouth 2 (two) times daily. 180 tablet 3   No current facility-administered medications for this visit.     Allergies:   Codeine and Cozaar [losartan potassium]   Social History   Socioeconomic History  . Marital status: Married    Spouse name: Not on file  . Number of children: 2  . Years of education: 45  . Highest education level: Not on file  Occupational History  . Occupation: retired, picks up Aeronautical engineer: RETIRED  Social Needs  . Financial resource strain: Not on file  . Food insecurity:    Worry: Not on file    Inability: Not on file  . Transportation needs:    Medical: Not on file    Non-medical: Not on file  Tobacco Use  . Smoking status: Former Smoker    Packs/day: 1.00    Years: 30.00    Pack years: 30.00    Types: Cigarettes    Last attempt to quit: 2006    Years since quitting: 13.5  . Smokeless tobacco: Never Used  Substance and Sexual Activity  . Alcohol use: Not Currently    Comment: once a week-wine  . Drug use: Never  . Sexual activity: Not Currently    Lifestyle  . Physical activity:    Days per week: Not on file    Minutes per session: Not on file  . Stress: Not on file  Relationships  . Social connections:    Talks on phone: Not on file    Gets together: Not on file    Attends religious service: Not on file    Active member of club or organization: Not on file    Attends meetings of clubs or organizations: Not on file    Relationship status: Not on file  Other Topics Concern  . Not on file  Social History Narrative   ** Merged History Encounter **       Stays active at home Regular exercise: no Drinks 2 cups of coffee a week, 1 mountain dew soda a day.     Family History:  The patient's family history includes Cholelithiasis in his daughter and son; Diabetes in his maternal grandmother; Hypertension in his maternal grandmother and mother; Thyroid disease in his mother.   ROS:   Please see the history of present illness.    Review of Systems  All other systems reviewed and are negative.    PHYSICAL EXAM:   VS:  BP 122/70   Pulse 77   Ht 6\' 2"  (1.88 m)   Wt 190 lb 6.4 oz (86.4 kg)   SpO2 98%   BMI 24.45 kg/m      General: Alert, oriented x3, no distress, appears comfortable Head: no evidence of trauma, PERRL, EOMI, no exophtalmos or lid lag, no myxedema, no xanthelasma; normal ears, nose and oropharynx Neck: normal jugular venous pulsations and no hepatojugular reflux; brisk carotid pulses without delay and no carotid bruits Chest: clear to auscultation, no signs of consolidation by percussion or palpation, normal fremitus, symmetrical and full respiratory excursions.  Dressing over a tunneled left subclavian hemodialysis catheter Cardiovascular: normal position and quality of the apical impulse, regular rhythm, normal first and paradoxically split second heart sounds, no murmurs, rubs or gallops Abdomen: no tenderness or distention, no masses by palpation, no abnormal pulsatility or arterial bruits, normal bowel  sounds, no  hepatosplenomegaly Extremities: no clubbing, cyanosis or edema; 2+ radial, ulnar and brachial pulses bilaterally; 2+ right femoral, posterior tibial and dorsalis pedis pulses; 2+ left femoral, posterior tibial and dorsalis pedis pulses; no subclavian or femoral bruits Neurological: grossly nonfocal Psych: Normal mood and affect   Wt Readings from Last 3 Encounters:  06/10/18 190 lb 6.4 oz (86.4 kg)  05/06/18 181 lb 3.2 oz (82.2 kg)  04/30/18 175 lb (79.4 kg)      Studies/Labs Reviewed:   EKG:  EKG is not ordered today.  From May 29 shows sinus rhythm, left axis deviation, typical left bundle branch block, QRS 142 ms  Recent Labs: 02/17/2018: B Natriuretic Peptide 800.9 03/03/2018: TSH 1.576 03/09/2018: Magnesium 2.3 03/11/2018: Pro B Natriuretic peptide (BNP) 230.0 04/24/2018: ALT 23 04/30/2018: BUN 24; Creatinine, Ser 6.43; Hemoglobin 12.5; Platelets 247; Potassium 4.4; Sodium 139   Lipid Panel    Component Value Date/Time   CHOL 197 12/31/2017 0945   TRIG 152 (H) 03/01/2018 0801   HDL 47 12/31/2017 0945   CHOLHDL 4.2 12/31/2017 0945   CHOLHDL 3.3 05/22/2017 0926   VLDL 12 05/22/2017 0926   LDLCALC 130 (H) 12/31/2017 0945     ASSESSMENT:    1. Chronic combined systolic and diastolic heart failure (HCC)   2. Paroxysmal atrial fibrillation ([place])   3. LBBB (left bundle branch block)   4. Essential hypertension   5. ESRD (end stage renal disease) ([place])   6. Asymptomatic HIV infection ([place])      PLAN:  In order of problems listed above:  1. CHF: Management has been much easier now that he is on hemodialysis.  We will try to introduce a very low dose of carvedilol.  Take this on nondialysis days and on the evenings of dialysis days, but not on the morning before dialysis sessions.  Reassess options for additional heart failure medication based on his blood pressure response.  He is not a good candidate for conventional ICD with transvenous leads due to dialysis.   Consideration can be given to a subcutaneous ICD, but with all his comorbid conditions, I am not sure this would have an overall impact on his future prognosis. 2. AFib: Currently normal sinus rhythm.  On warfarin anticoagulation. CHADSVasc4 (age 51, HF, history of hypertension) 3. LBBB: CRT is considered, but he is already having problems with venous access for dialysis and will be at high risk for infection complications.  I am not sure the overall risk-benefit balance is in his favor. 4. HTN: Was well controlled - presumed to be primary etiology for his cardiomyopathy,, now "burned out". 5. ESRD: I am not certain if there are still plans for placement of a permanent dialysis access 6. HIV: Controlled, followed by Dr. Tommy Medal.  Undetectable viral load in March  Medication Adjustments/Labs and Tests Ordered: Current medicines are reviewed at length with the patient today.  Concerns regarding medicines are outlined above.  Medication changes, Labs and Tests ordered today are listed below. Patient Instructions  Dr Sallyanne Kuster has recommended making the following medication changes: 1. START Carvedilol 3.125 twice daily >>ON DIALYSIS DAYS (Tuesdays, Thursdays, Saturdays) - take the [place]  Your physician recommends that you schedule a follow-up appointment in 1 month with Kerin Ransom, PA-C.  Dr Sallyanne Kuster recommends that you schedule a follow-up appointment in 6 months.  If you need a refill on your cardiac medications before your next appointment, please call your pharmacy.     Signed, [name], MD  06/10/2018  7:57 PM    [facility] [place], [place], [place]  [address] Phone: [phone]; Fax: [phone]

## 2018-06-11 DIAGNOSIS — N2581 Secondary hyperparathyroidism of renal origin: Secondary | ICD-10-CM | POA: Diagnosis not present

## 2018-06-11 DIAGNOSIS — N186 End stage renal disease: Secondary | ICD-10-CM | POA: Diagnosis not present

## 2018-06-13 DIAGNOSIS — N186 End stage renal disease: Secondary | ICD-10-CM | POA: Diagnosis not present

## 2018-06-13 DIAGNOSIS — N2581 Secondary hyperparathyroidism of renal origin: Secondary | ICD-10-CM | POA: Diagnosis not present

## 2018-06-15 DIAGNOSIS — N186 End stage renal disease: Secondary | ICD-10-CM | POA: Diagnosis not present

## 2018-06-15 DIAGNOSIS — N2581 Secondary hyperparathyroidism of renal origin: Secondary | ICD-10-CM | POA: Diagnosis not present

## 2018-06-17 ENCOUNTER — Ambulatory Visit: Payer: Medicare HMO | Admitting: Family

## 2018-06-18 ENCOUNTER — Other Ambulatory Visit: Payer: Medicare HMO

## 2018-06-18 DIAGNOSIS — N2581 Secondary hyperparathyroidism of renal origin: Secondary | ICD-10-CM | POA: Diagnosis not present

## 2018-06-18 DIAGNOSIS — N186 End stage renal disease: Secondary | ICD-10-CM | POA: Diagnosis not present

## 2018-06-19 ENCOUNTER — Other Ambulatory Visit: Payer: Medicare HMO

## 2018-06-19 DIAGNOSIS — I5042 Chronic combined systolic (congestive) and diastolic (congestive) heart failure: Secondary | ICD-10-CM

## 2018-06-19 DIAGNOSIS — N184 Chronic kidney disease, stage 4 (severe): Secondary | ICD-10-CM

## 2018-06-19 DIAGNOSIS — N179 Acute kidney failure, unspecified: Secondary | ICD-10-CM | POA: Diagnosis not present

## 2018-06-19 DIAGNOSIS — B20 Human immunodeficiency virus [HIV] disease: Secondary | ICD-10-CM | POA: Diagnosis not present

## 2018-06-19 DIAGNOSIS — Z113 Encounter for screening for infections with a predominantly sexual mode of transmission: Secondary | ICD-10-CM | POA: Diagnosis not present

## 2018-06-20 DIAGNOSIS — N186 End stage renal disease: Secondary | ICD-10-CM | POA: Diagnosis not present

## 2018-06-20 DIAGNOSIS — N2581 Secondary hyperparathyroidism of renal origin: Secondary | ICD-10-CM | POA: Diagnosis not present

## 2018-06-20 LAB — COMPLETE METABOLIC PANEL WITH GFR
AG RATIO: 1.5 (calc) (ref 1.0–2.5)
ALKALINE PHOSPHATASE (APISO): 99 U/L (ref 40–115)
ALT: 23 U/L (ref 9–46)
AST: 19 U/L (ref 10–35)
Albumin: 4.3 g/dL (ref 3.6–5.1)
BILIRUBIN TOTAL: 0.6 mg/dL (ref 0.2–1.2)
BUN / CREAT RATIO: 4 (calc) — AB (ref 6–22)
BUN: 33 mg/dL — ABNORMAL HIGH (ref 7–25)
CHLORIDE: 97 mmol/L — AB (ref 98–110)
CO2: 27 mmol/L (ref 20–32)
Calcium: 9.1 mg/dL (ref 8.6–10.3)
Creat: 7.44 mg/dL — ABNORMAL HIGH (ref 0.70–1.18)
GFR, Est African American: 7 mL/min/{1.73_m2} — ABNORMAL LOW (ref >=60)
GFR, Est Non African American: 6 mL/min/{1.73_m2} — ABNORMAL LOW (ref >=60)
GLOBULIN: 2.9 g/dL (ref 1.9–3.7)
Glucose, Bld: 146 mg/dL — ABNORMAL HIGH (ref 65–99)
Potassium: 3.6 mmol/L (ref 3.5–5.3)
SODIUM: 138 mmol/L (ref 135–146)
Total Protein: 7.2 g/dL (ref 6.1–8.1)

## 2018-06-20 LAB — RPR TITER

## 2018-06-20 LAB — CBC WITH DIFFERENTIAL/PLATELET
Basophils Absolute: 62 cells/uL (ref 0–200)
Basophils Relative: 1.1 %
Eosinophils Absolute: 241 cells/uL (ref 15–500)
Eosinophils Relative: 4.3 %
HEMATOCRIT: 33.5 % — AB (ref 38.5–50.0)
Hemoglobin: 11.2 g/dL — ABNORMAL LOW (ref 13.2–17.1)
Lymphs Abs: 1490 cells/uL (ref 850–3900)
MCH: 35.1 pg — ABNORMAL HIGH (ref 27.0–33.0)
MCHC: 33.4 g/dL (ref 32.0–36.0)
MCV: 105 fL — AB (ref 80.0–100.0)
MPV: 9.7 fL (ref 7.5–12.5)
Monocytes Relative: 10.1 %
NEUTROS PCT: 57.9 %
Neutro Abs: 3242 cells/uL (ref 1500–7800)
Platelets: 273 10*3/uL (ref 140–400)
RBC: 3.19 10*6/uL — ABNORMAL LOW (ref 4.20–5.80)
RDW: 15.7 % — AB (ref 11.0–15.0)
TOTAL LYMPHOCYTE: 26.6 %
WBC: 5.6 10*3/uL (ref 3.8–10.8)
WBCMIX: 566 {cells}/uL (ref 200–950)

## 2018-06-20 LAB — PROTIME-INR: INR: 2.4 — AB (ref 0.9–1.1)

## 2018-06-20 LAB — RPR: RPR: REACTIVE — AB

## 2018-06-20 LAB — T-HELPER CELL (CD4) - (RCID CLINIC ONLY)
CD4 T CELL HELPER: 26 % — AB (ref 33–55)
CD4 T Cell Abs: 380 /uL — ABNORMAL LOW (ref 400–2700)

## 2018-06-20 LAB — FLUORESCENT TREPONEMAL AB(FTA)-IGG-BLD: Fluorescent Treponemal ABS: REACTIVE — AB

## 2018-06-21 ENCOUNTER — Ambulatory Visit (INDEPENDENT_AMBULATORY_CARE_PROVIDER_SITE_OTHER): Payer: Medicare HMO | Admitting: Pharmacist

## 2018-06-21 DIAGNOSIS — Z5181 Encounter for therapeutic drug level monitoring: Secondary | ICD-10-CM

## 2018-06-21 DIAGNOSIS — I4891 Unspecified atrial fibrillation: Secondary | ICD-10-CM

## 2018-06-21 LAB — HIV-1 RNA QUANT-NO REFLEX-BLD
HIV 1 RNA QUANT: DETECTED {copies}/mL — AB
HIV-1 RNA QUANT, LOG: DETECTED {Log_copies}/mL — AB

## 2018-06-22 DIAGNOSIS — N2581 Secondary hyperparathyroidism of renal origin: Secondary | ICD-10-CM | POA: Diagnosis not present

## 2018-06-22 DIAGNOSIS — N186 End stage renal disease: Secondary | ICD-10-CM | POA: Diagnosis not present

## 2018-06-24 ENCOUNTER — Encounter: Payer: Self-pay | Admitting: Infectious Disease

## 2018-06-25 DIAGNOSIS — N186 End stage renal disease: Secondary | ICD-10-CM | POA: Diagnosis not present

## 2018-06-25 DIAGNOSIS — N2581 Secondary hyperparathyroidism of renal origin: Secondary | ICD-10-CM | POA: Diagnosis not present

## 2018-06-27 DIAGNOSIS — N186 End stage renal disease: Secondary | ICD-10-CM | POA: Diagnosis not present

## 2018-06-27 DIAGNOSIS — N2581 Secondary hyperparathyroidism of renal origin: Secondary | ICD-10-CM | POA: Diagnosis not present

## 2018-06-27 LAB — PROTIME-INR: INR: 2.4 — AB (ref 0.9–1.1)

## 2018-06-29 DIAGNOSIS — N186 End stage renal disease: Secondary | ICD-10-CM | POA: Diagnosis not present

## 2018-06-29 DIAGNOSIS — N2581 Secondary hyperparathyroidism of renal origin: Secondary | ICD-10-CM | POA: Diagnosis not present

## 2018-07-01 ENCOUNTER — Ambulatory Visit (INDEPENDENT_AMBULATORY_CARE_PROVIDER_SITE_OTHER): Payer: Medicare HMO | Admitting: Pharmacist Clinician (PhC)/ Clinical Pharmacy Specialist

## 2018-07-01 DIAGNOSIS — I4891 Unspecified atrial fibrillation: Secondary | ICD-10-CM

## 2018-07-01 DIAGNOSIS — Z5181 Encounter for therapeutic drug level monitoring: Secondary | ICD-10-CM | POA: Diagnosis not present

## 2018-07-02 ENCOUNTER — Ambulatory Visit: Payer: Medicare HMO | Admitting: Infectious Disease

## 2018-07-02 DIAGNOSIS — N186 End stage renal disease: Secondary | ICD-10-CM | POA: Diagnosis not present

## 2018-07-02 DIAGNOSIS — N2581 Secondary hyperparathyroidism of renal origin: Secondary | ICD-10-CM | POA: Diagnosis not present

## 2018-07-03 ENCOUNTER — Other Ambulatory Visit: Payer: Self-pay | Admitting: *Deleted

## 2018-07-03 ENCOUNTER — Ambulatory Visit (INDEPENDENT_AMBULATORY_CARE_PROVIDER_SITE_OTHER): Payer: Medicare HMO | Admitting: Vascular Surgery

## 2018-07-03 ENCOUNTER — Encounter: Payer: Self-pay | Admitting: Vascular Surgery

## 2018-07-03 ENCOUNTER — Other Ambulatory Visit: Payer: Self-pay

## 2018-07-03 ENCOUNTER — Encounter: Payer: Self-pay | Admitting: *Deleted

## 2018-07-03 ENCOUNTER — Encounter: Payer: Self-pay | Admitting: Family

## 2018-07-03 ENCOUNTER — Ambulatory Visit (INDEPENDENT_AMBULATORY_CARE_PROVIDER_SITE_OTHER): Payer: Medicare HMO | Admitting: Family

## 2018-07-03 VITALS — BP 123/72 | HR 63 | Resp 20 | Ht 74.0 in | Wt 187.0 lb

## 2018-07-03 VITALS — BP 119/72 | HR 68 | Temp 97.6°F | Wt 187.8 lb

## 2018-07-03 DIAGNOSIS — B20 Human immunodeficiency virus [HIV] disease: Secondary | ICD-10-CM | POA: Diagnosis not present

## 2018-07-03 DIAGNOSIS — Z992 Dependence on renal dialysis: Secondary | ICD-10-CM | POA: Diagnosis not present

## 2018-07-03 DIAGNOSIS — N186 End stage renal disease: Secondary | ICD-10-CM

## 2018-07-03 NOTE — Assessment & Plan Note (Signed)
David [name] was well controlled HIV with a viral load is undetectable and a CD4 count of 380. He is adherent with his medication with no adverse side effects. He has no symptoms of opportunistic infection/progressing HIV disease. History of physical exam. All immunizations are up-to-date per recommendations. Continue current dose of Tivicay and Descovy. Plan for follow-up in 6 months with lab work one to 2 weeks prior to appointment. Encouraged to schedule nurse visit in September to obtain flu shot.

## 2018-07-03 NOTE — Progress Notes (Signed)
Established Dialysis Access   History of Present Illness   [facility] David [name] is a 77 y.o. (03-01-1941) male who presents for re-evaluation for permanent access.  The patient is right hand dominant.  Previous access procedures have been completed in both arms.  The patient's complication from previous access procedures include: thrombosis.  The patient has never had a previous PPM placed.  He currently has RIJV TDC.  Past Medical History:  Diagnosis Date  . Acute on chronic systolic and diastolic heart failure, NYHA class 4 ([place])   . Anemia, iron deficiency 11/15/2011  . Arthritis   . Arthritis    "hands, right knee, feet" (02/21/2018)  . Cancer [facility])    hx of prostate; s/p radioactive seed implant 10/2009 Dr Janice Norrie  . Cardiac arrest ([place]) 02/17/2018  . CHF (congestive heart failure) ([place])   . Chronic kidney disease   . Chronic lower back pain   . CKD (chronic kidney disease) stage V requiring chronic dialysis ([place])   . ESRD on dialysis ([place])    started 02/2018  . Glaucoma   . Gout    daily RX (02/21/2018)  . Heart murmur    "mild" per pt  . History of cardiac cath 2004   negative for CAD  . History of myocardial perfusion scan 02/2010   negative for coronary insufficiency (LVEF 27%)  . HIV (human immunodeficiency virus infection) ([place])   . HIV infection ([place])   . Hyperlipidemia   . Hypertension    followed by [facility] and Vascular (Dr Dani Gobble [place])  . Hypertension   . Nonischemic cardiomyopathy ([place])   . Pneumonia 11/2017  . Prostate cancer ([place])   . Sinus bradycardia   . Stroke [facility])    "mini stroke" years ago  . Systolic and diastolic CHF, acute ([place]) 06/2010   felt to be secondary to hypertensive cardiomyopathy    Past Surgical History:  Procedure Laterality Date  . AV FISTULA PLACEMENT Right 10/13/2016   Procedure: ARTERIOVENOUS (AV) FISTULA CREATION;  Surgeon: [name], MD;  Location: [place];  Service: Vascular;  Laterality: Right;  . AV  FISTULA PLACEMENT Right 10/13/2016   [name] [id number]  . AV FISTULA PLACEMENT Left 02/25/2018   Procedure: INSERTION OF ARTERIOVENOUS (AV) GORE-TEX GRAFT LEFT UPPER ARM;  Surgeon: Angelia Mould, MD;  Location: [place];  Service: Vascular;  Laterality: Left;  . BASCILIC VEIN TRANSPOSITION Left 06/24/2015   Procedure: BASILIC VEIN TRANSPOSITION;  Surgeon: Mal Misty, MD;  Location: [place];  Service: Vascular;  Laterality: Left;  . BASCILIC VEIN TRANSPOSITION Left 8/84/1660 Bascilic vein transposition (Left)   [name] [id number]  . CARDIAC CATHETERIZATION  02/04/2003   mildly depressed LV systolic fx EF 32%,TFTDDU coronaries/abdominal aorta/renal arteries.  Marland Kitchen CARDIAC CATHETERIZATION  2004  . CATARACT EXTRACTION, BILATERAL Bilateral   . COLONOSCOPY    . ESOPHAGOGASTRODUODENOSCOPY (EGD) WITH PROPOFOL N/A 05/05/2014   Procedure: ESOPHAGOGASTRODUODENOSCOPY (EGD) WITH PROPOFOL;  Surgeon: Missy Sabins, MD;  [facility];  Service: Endoscopy;  Laterality: N/A;  . EYE SURGERY Bilateral    cataract surgery   . EYE SURGERY Bilateral    glaucoma surgery  . EYE SURGERY    . GLAUCOMA SURGERY Bilateral   . INSERTION PROSTATE RADIATION SEED    . IR FLUORO GUIDE CV LINE RIGHT  02/18/2018  . IR US GUIDE VASC ACCESS RIGHT  02/18/2018  . LEFT HEART CATH AND CORONARY ANGIOGRAPHY N/A 02/20/2018   Procedure: LEFT HEART CATH AND CORONARY ANGIOGRAPHY;  Surgeon: Jettie Booze, MD;  Location: [place] CV [facility];  Service: Cardiovascular;  Laterality: N/A;  . NM MYOCAR PERF WALL MOTION  02/21/2010   normal  . RADIOACTIVE SEED IMPLANT  2010   prostate cancer  . THROMBECTOMY W/ EMBOLECTOMY Left 02/26/2018   Procedure: THROMBECTOMY ARTERIOVENOUS GRAFT;  Surgeon: Angelia Mould, MD;  Location: [place];  Service: Vascular;  Laterality: Left;  . ULTRASOUND GUIDANCE FOR VASCULAR ACCESS  02/20/2018   Procedure: Ultrasound Guidance For Vascular Access;  Surgeon: Jettie Booze, MD;  Location: [place] CV LAB;  Service: Cardiovascular;;  . US ECHOCARDIOGRAPHY  02/06/2012   mild LVH,LA mod. dilated,mild-mod. 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 AI.    Social History   Socioeconomic History  . Marital status: Married    Spouse name: Not on file  . Number of children: 2  . Years of education: 29  . Highest education level: Not on file  Occupational History  . Occupation: retired, picks up Aeronautical engineer: RETIRED  Social Needs  . Financial resource strain: Not on file  . Food insecurity:    Worry: Not on file    Inability: Not on file  . Transportation needs:    Medical: Not on file    Non-medical: Not on file  Tobacco Use  . Smoking status: Former Smoker    Packs/day: 1.00    Years: 30.00    Pack years: 30.00    Types: Cigarettes    Last attempt to quit: 2006    Years since quitting: 13.5  . Smokeless tobacco: Never Used  Substance and Sexual Activity  . Alcohol use: Not Currently    Comment: once a week-wine  . Drug use: Never  . Sexual activity: Not Currently  Lifestyle  . Physical activity:    Days per week: Not on file    Minutes per session: Not on file  . Stress: Not on file  Relationships  . Social connections:    Talks on phone: Not on file    Gets together: Not on file    Attends religious service: Not on file    Active member of club or organization: Not on file    Attends meetings of clubs or organizations: Not on file    Relationship status: Not on file  . Intimate partner violence:    Fear of current or ex partner: Not on file    Emotionally abused: Not on file    Physically abused: Not on file    Forced sexual activity: Not on file  Other Topics Concern  . Not on file  Social History Narrative   ** Merged History Encounter **       Stays active at home Regular exercise: no Drinks 2 cups of coffee a week, 1 mountain dew soda a day.    Family History  Problem Relation Age of Onset  .  Hypertension Mother   . Thyroid disease Mother   . Cholelithiasis Daughter   . Cholelithiasis Son   . Hypertension Maternal Grandmother   . Diabetes Maternal Grandmother   . Heart attack Neg Hx   . Hyperlipidemia Neg Hx     Current Outpatient Medications  Medication Sig Dispense Refill  . amiodarone (PACERONE) 200 MG tablet TAKE 1 TABLET(200 MG) BY MOUTH DAILY 30 tablet 4  . brimonidine (ALPHAGAN) 0.15 % ophthalmic solution Place 1 drop into the left eye 2 (two) times daily.  3  .  carvedilol (COREG) 3.125 MG tablet Take 1 tablet (3.125 mg total) by mouth 2 (two) times daily. 180 tablet 3  . DESCOVY 200-25 MG tablet Take 1 tablet by mouth every morning.  11  . dorzolamide-timolol (COSOPT) 22.3-6.8 MG/ML ophthalmic solution Place 1 drop into the left eye 2 (two) times daily.    Marland Kitchen latanoprost (XALATAN) 0.005 % ophthalmic solution Place 1 drop into the left eye at bedtime.     . Multiple Vitamins-Minerals (ONE DAILY MENS 50+ MULTIVIT) TABS Take 1 tablet by mouth daily.    . polyethylene glycol (MIRALAX / GLYCOLAX) packet Take 17 g by mouth daily as needed for mild constipation. 14 each 0  . TIVICAY 50 MG tablet Take 50 mg by mouth every morning.  11  . Vitamin D, Ergocalciferol, (DRISDOL) 50000 UNITS CAPS capsule Take 50,000 units by mouth once a week on Saturdays  2  . warfarin (COUMADIN) 5 MG tablet TAKE 1 TABLET(5 MG) BY MOUTH DAILY AT 6 PM 30 tablet 0  . aspirin EC 81 MG tablet Take 81 mg by mouth daily.     No current facility-administered medications for this visit.      Allergies  Allergen Reactions  . Codeine Other (See Comments)    Causes constipation  . Cozaar [Losartan Potassium] Other (See Comments)    Causes constipation     REVIEW OF SYSTEMS (negative unless checked):   Cardiac:  []  Chest pain or chest pressure? []  Shortness of breath upon activity? []  Shortness of breath when lying flat? []  Irregular heart rhythm?  Vascular:  []  Pain in calf, thigh, or hip  brought on by walking? []  Pain in feet at night that wakes you up from your sleep? []  Blood clot in your veins? []  Leg swelling?  Pulmonary:  []  Oxygen at home? []  Productive cough? []  Wheezing?  Neurologic:  []  Sudden weakness in arms or legs? []  Sudden numbness in arms or legs? []  Sudden onset of difficult speaking or slurred speech? []  Temporary loss of vision in one eye? []  Problems with dizziness?  Gastrointestinal:  []  Blood in stool? []  Vomited blood?  Genitourinary:  []  Burning when urinating? []  Blood in urine? [x]   End stage renal disease-HD: T/R/S   Psychiatric:  []  Major depression  Hematologic:  []  Bleeding problems? []  Problems with blood clotting?  Dermatologic:  []  Rashes or ulcers?  Constitutional:  []  Fever or chills?  Ear/Nose/Throat:  []  Change in hearing? []  Nose bleeds? []  Sore throat?  Musculoskeletal:  []  Back pain? []  Joint pain? []  Muscle pain?    Physical Examination   Vitals:   07/03/18 1157  BP: 123/72  Pulse: 63  Resp: 20  SpO2: 100%  Weight: 187 lb (84.8 kg)  Height: 6\' 2"  (1.88 m)   Body mass index is 24.01 kg/m.  General Alert, O x 3, WD, NAD  Pulmonary Sym exp, good B air movt, CTA B  Cardiac RRR, Nl S1, S2, no Murmurs, No rubs, No S3,S4  Vascular Vessel Right Left  Radial Palpable Palpable  Brachial Palpable Palpable  Ulnar Not palpable Not palpable    Musculo- skeletal M/S 5/5 throughout  , Extremities without ischemic changes, no thrill or bruit in either arm, palpable thrombosed grafts  Neurologic Pain and light touch intact in extremities, Motor exam as listed above    Medical Decision Making   [place] is a 77 y.o. male who presents with ESRD requiring hemodialysis.    Given multiple failures, I would  get bilateral arm and central venogram so if any residual upper extremity vein usable for access.  This will need to be scheduled on a non-dialysis day, so one of my partners will  have to schedule the study.  Based on those findings, the patient's next access will be scheduled, likely with other of my partners as I will likely have transitioned to a different practice by then. Risk of extremity and central venography include but are not limited to: anaphylaxis, bleeding, infection, and incomplete visualization of the venous system. The patient is aware of these risks and has elected to proceed.   Adele Barthel, MD, FACS [facility] Office: [phone] Pager: [phone]

## 2018-07-03 NOTE — Progress Notes (Signed)
Subjective:    Patient ID: David [name], male    DOB: 12/26/1940, 78 y.o.   MRN: [id number]  Chief Complaint  Patient presents with  . HIV Positive/AIDS     HPI:  David [name] is a 77 y.o. male who presents today for routine follow up of his HIV disease.   David [name] was last seen in the office on 12/24/17 by Dr. Tommy Medal for routine follow up. He was having nausea and light headedness with [name] and he was previously changed to [place] and Descovy and was tolerating the medication without problem. Since his office visit he was admitted to the hospital with respiratory arrest requiring CPR and intubation which he has recovered from. His most recent blood work completed on 06/19/18 shows a viral load that is undetectable and a CD4 count of 380. His RPR titer remains reactive at 1:2. His creatinine is 7.44 with known end-stage renal disease with mild anemia of 11.2.  David [name] continues to take Tivicay and Descovy as prescribed with no adverse side effects. He has no missed dosages and has no problems obtaining or taking his medications. Denies fevers, chills, night sweats, headaches, changes in vision, neck pain/stiffness, nausea, diarrhea, vomiting, lesions or rashes.   He has appointment today to meet with the vascular surgeons to evaluate for permanent dialysis access.    Allergies  Allergen Reactions  . Codeine Other (See Comments)    Causes constipation  . Cozaar [Losartan Potassium] Other (See Comments)    Causes constipation       Outpatient Medications Prior to Visit  Medication Sig Dispense Refill  . amiodarone (PACERONE) 200 MG tablet TAKE 1 TABLET(200 MG) BY MOUTH DAILY 30 tablet 4  . aspirin EC 81 MG tablet Take 81 mg by mouth daily.    . brimonidine (ALPHAGAN) 0.15 % ophthalmic solution Place 1 drop into the left eye 2 (two) times daily.  3  . carvedilol (COREG) 3.125 MG tablet Take 1 tablet (3.125 mg total) by mouth 2 (two) times daily. 180 tablet 3    . DESCOVY 200-25 MG tablet Take 1 tablet by mouth every morning.  11  . dorzolamide-timolol (COSOPT) 22.3-6.8 MG/ML ophthalmic solution Place 1 drop into the left eye 2 (two) times daily.    Marland Kitchen latanoprost (XALATAN) 0.005 % ophthalmic solution Place 1 drop into the left eye at bedtime.     . Multiple Vitamins-Minerals (ONE DAILY MENS 50+ MULTIVIT) TABS Take 1 tablet by mouth daily.    . polyethylene glycol (MIRALAX / GLYCOLAX) packet Take 17 g by mouth daily as needed for mild constipation. 14 each 0  . TIVICAY 50 MG tablet Take 50 mg by mouth every morning.  11  . Vitamin D, Ergocalciferol, (DRISDOL) 50000 UNITS CAPS capsule Take 50,000 units by mouth once a week on Saturdays  2  . warfarin (COUMADIN) 5 MG tablet TAKE 1 TABLET(5 MG) BY MOUTH DAILY AT 6 PM 30 tablet 0   No facility-administered medications prior to visit.      Past Medical History:  Diagnosis Date  . Acute on chronic systolic and diastolic heart failure, NYHA class 4 ([place])   . Anemia, iron deficiency 11/15/2011  . Arthritis   . Arthritis    "hands, right knee, feet" (02/21/2018)  . Cancer [facility])    hx of prostate; s/p radioactive seed implant 10/2009 Dr Janice Norrie  . Cardiac arrest ([place]) 02/17/2018  . CHF (congestive heart failure) ([place])   . Chronic kidney disease   .  Chronic lower back pain   . CKD (chronic kidney disease) stage V requiring chronic dialysis ([place])   . ESRD on dialysis ([place])    started 02/2018  . Glaucoma   . Gout    daily RX (02/21/2018)  . Heart murmur    "mild" per pt  . History of cardiac cath 2004   negative for CAD  . History of myocardial perfusion scan 02/2010   negative for coronary insufficiency (LVEF 27%)  . HIV (human immunodeficiency virus infection) ([place])   . HIV infection ([place])   . Hyperlipidemia   . Hypertension    followed by [facility] and Vascular (Dr Dani Gobble [place]itoru)  . Hypertension   . Nonischemic cardiomyopathy ([place])   . Pneumonia 11/2017  . Prostate cancer ([place])   .  Sinus bradycardia   . Stroke [facility])    "mini stroke" years ago  . Systolic and diastolic CHF, acute ([place]) 06/2010   felt to be secondary to hypertensive cardiomyopathy     Past Surgical History:  Procedure Laterality Date  . AV FISTULA PLACEMENT Right 10/13/2016   Procedure: ARTERIOVENOUS (AV) FISTULA CREATION;  Surgeon: [name], MD;  Location: [place];  Service: Vascular;  Laterality: Right;  . AV FISTULA PLACEMENT Right 10/13/2016   [name] [id number]  . AV FISTULA PLACEMENT Left 02/25/2018   Procedure: INSERTION OF ARTERIOVENOUS (AV) GORE-TEX GRAFT LEFT UPPER ARM;  Surgeon: Angelia Mould, MD;  Location: [place];  Service: Vascular;  Laterality: Left;  . BASCILIC VEIN TRANSPOSITION Left 06/24/2015   Procedure: BASILIC VEIN TRANSPOSITION;  Surgeon: Mal Misty, MD;  Location: [place];  Service: Vascular;  Laterality: Left;  . BASCILIC VEIN TRANSPOSITION Left 9/56/2130 Bascilic vein transposition (Left)   [name] [id number]  . CARDIAC CATHETERIZATION  02/04/2003   mildly depressed LV systolic fx EF 29%,[id number] coronaries/abdominal aorta/renal arteries.  Marland Kitchen CARDIAC CATHETERIZATION  2004  . CATARACT EXTRACTION, BILATERAL Bilateral   . COLONOSCOPY    . ESOPHAGOGASTRODUODENOSCOPY (EGD) WITH PROPOFOL N/A 05/05/2014   Procedure: ESOPHAGOGASTRODUODENOSCOPY (EGD) WITH PROPOFOL;  Surgeon: Missy Sabins, MD;  [facility];  Service: Endoscopy;  Laterality: N/A;  . EYE SURGERY Bilateral    cataract surgery   . EYE SURGERY Bilateral    glaucoma surgery  . EYE SURGERY    . GLAUCOMA SURGERY Bilateral   . INSERTION PROSTATE RADIATION SEED    . IR FLUORO GUIDE CV LINE RIGHT  02/18/2018  . IR US GUIDE VASC ACCESS RIGHT  02/18/2018  . LEFT HEART CATH AND CORONARY ANGIOGRAPHY N/A 02/20/2018   Procedure: LEFT HEART CATH AND CORONARY ANGIOGRAPHY;  Surgeon: Jettie Booze, MD;  Location: [place] CV LAB;  Service: Cardiovascular;  Laterality: N/A;  . NM MYOCAR PERF WALL MOTION  02/21/2010    normal  . RADIOACTIVE SEED IMPLANT  2010   prostate cancer  . THROMBECTOMY W/ EMBOLECTOMY Left 02/26/2018   Procedure: THROMBECTOMY ARTERIOVENOUS GRAFT;  Surgeon: Angelia Mould, MD;  Location: [place];  Service: Vascular;  Laterality: Left;  . ULTRASOUND GUIDANCE FOR VASCULAR ACCESS  02/20/2018   Procedure: Ultrasound Guidance For Vascular Access;  Surgeon: Jettie Booze, MD;  Location: [place] [facility];  Service: Cardiovascular;;  . US ECHOCARDIOGRAPHY  02/06/2012   mild LVH,LA mod. dilated,mild-mod. 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 AI.       Review of Systems  Constitutional: Negative for appetite change, chills, fatigue, fever and unexpected weight change.  Eyes: Negative for visual  disturbance.  Respiratory: Negative for cough, chest tightness, shortness of breath and wheezing.   Cardiovascular: Negative for chest pain and leg swelling.  Gastrointestinal: Negative for abdominal pain, constipation, diarrhea, nausea and vomiting.  Genitourinary: Negative for dysuria, flank pain, frequency, genital sores, hematuria and urgency.  Skin: Negative for rash.  Allergic/Immunologic: Negative for immunocompromised state.  Neurological: Negative for dizziness and headaches.      Objective:    BP 119/72   Pulse 68   Temp 97.6 F (36.4 C)   Wt 187 lb 12.8 oz (85.2 kg)   BMI 24.11 kg/m  Nursing note and vital signs reviewed.  Physical Exam  Constitutional: He is oriented to person, place, and time. He appears well-developed. No distress.  Elderly gentleman seated in the chair with cane next to him; pleasant.   HENT:  Mouth/Throat: Oropharynx is clear and moist.  Eyes: Conjunctivae are normal.  Neck: Neck supple.  Cardiovascular: Normal rate, regular rhythm, normal heart sounds and intact distal pulses. Exam reveals no gallop and no friction rub.  No murmur heard. Pulmonary/Chest: Effort normal and breath sounds normal. No respiratory  distress. He has no wheezes. He has no rales. He exhibits no tenderness.  Abdominal: Soft. Bowel sounds are normal. There is no tenderness.  Lymphadenopathy:    He has no cervical adenopathy.  Neurological: He is alert and oriented to person, place, and time.  Skin: Skin is warm and dry. No rash noted.  Psychiatric: He has a normal mood and affect. His behavior is normal. Judgment and thought content normal.       Assessment & Plan:   Problem List Items Addressed This Visit      Genitourinary   ESRD (end stage renal disease) ([place])    Stable currently receiving dialysis. Has appointment with vascular surgery for permanent access evaluation.        Other   HIV (human immunodeficiency virus infection) ([place]) - Primary    David [name] was well controlled HIV with a viral load is undetectable and a CD4 count of 380. He is adherent with his medication with no adverse side effects. He has no symptoms of opportunistic infection/progressing HIV disease. History of physical exam. All immunizations are up-to-date per recommendations. Continue current dose of Tivicay and Descovy. Plan for follow-up in 6 months with lab work one to 2 weeks prior to appointment. Encouraged to schedule nurse visit in September to obtain flu shot.      Relevant Orders   T-helper cell (CD4)- ([facility] clinic only)   HIV 1 RNA quant-no reflex-bld   CBC   Comprehensive metabolic panel   Lipid panel   RPR       I am having Weston Settle maintain his Vitamin D (Ergocalciferol), latanoprost, TIVICAY, aspirin EC, DESCOVY, brimonidine, dorzolamide-timolol, polyethylene glycol, warfarin, amiodarone, ONE DAILY MENS 50+ MULTIVIT, and carvedilol.   Follow-up: Return in about 6 months (around 01/03/2019), or if symptoms worsen or fail to improve.   Terri Piedra, MSN, FNP-C Nurse Practitioner [facility] Office phone: [phone] Pager: [place] number:  [phone]

## 2018-07-03 NOTE — Assessment & Plan Note (Signed)
Stable currently receiving dialysis. Has appointment with vascular surgery for permanent access evaluation.

## 2018-07-03 NOTE — Patient Instructions (Signed)
Nice to meet you.  Please continue to take your Tivicay and Descovy.  Plan for follow up in 6 months or sooner if needed with lab work 1-2 weeks prior.    Please schedule an appointment for a nurse visit in September to get your flu shot.

## 2018-07-04 DIAGNOSIS — N186 End stage renal disease: Secondary | ICD-10-CM | POA: Diagnosis not present

## 2018-07-04 DIAGNOSIS — N2581 Secondary hyperparathyroidism of renal origin: Secondary | ICD-10-CM | POA: Diagnosis not present

## 2018-07-04 LAB — PROTIME-INR

## 2018-07-05 ENCOUNTER — Other Ambulatory Visit: Payer: Self-pay | Admitting: Cardiovascular Disease

## 2018-07-06 DIAGNOSIS — N2581 Secondary hyperparathyroidism of renal origin: Secondary | ICD-10-CM | POA: Diagnosis not present

## 2018-07-06 DIAGNOSIS — N186 End stage renal disease: Secondary | ICD-10-CM | POA: Diagnosis not present

## 2018-07-08 ENCOUNTER — Ambulatory Visit: Payer: Medicare HMO | Admitting: Cardiovascular Disease

## 2018-07-08 ENCOUNTER — Ambulatory Visit (HOSPITAL_COMMUNITY)
Admission: RE | Admit: 2018-07-08 | Discharge: 2018-07-08 | Disposition: A | Discharge status: Home / Self Care | Payer: Medicare HMO | Source: Ambulatory Visit | Attending: Vascular Surgery | Admitting: Vascular Surgery

## 2018-07-08 ENCOUNTER — Encounter (HOSPITAL_COMMUNITY)
Admission: RE | Disposition: A | Discharge status: Home / Self Care | Payer: Self-pay | Source: Ambulatory Visit | Attending: Vascular Surgery

## 2018-07-08 DIAGNOSIS — N185 Chronic kidney disease, stage 5: Secondary | ICD-10-CM

## 2018-07-08 DIAGNOSIS — Z992 Dependence on renal dialysis: Secondary | ICD-10-CM | POA: Insufficient documentation

## 2018-07-08 DIAGNOSIS — I428 Other cardiomyopathies: Secondary | ICD-10-CM | POA: Diagnosis not present

## 2018-07-08 DIAGNOSIS — N186 End stage renal disease: Secondary | ICD-10-CM | POA: Insufficient documentation

## 2018-07-08 DIAGNOSIS — Z7901 Long term (current) use of anticoagulants: Secondary | ICD-10-CM | POA: Diagnosis not present

## 2018-07-08 DIAGNOSIS — I5042 Chronic combined systolic (congestive) and diastolic (congestive) heart failure: Secondary | ICD-10-CM | POA: Insufficient documentation

## 2018-07-08 DIAGNOSIS — Z7982 Long term (current) use of aspirin: Secondary | ICD-10-CM | POA: Diagnosis not present

## 2018-07-08 DIAGNOSIS — H409 Unspecified glaucoma: Secondary | ICD-10-CM | POA: Diagnosis not present

## 2018-07-08 DIAGNOSIS — G8929 Other chronic pain: Secondary | ICD-10-CM | POA: Insufficient documentation

## 2018-07-08 DIAGNOSIS — M549 Dorsalgia, unspecified: Secondary | ICD-10-CM | POA: Diagnosis not present

## 2018-07-08 DIAGNOSIS — E785 Hyperlipidemia, unspecified: Secondary | ICD-10-CM | POA: Insufficient documentation

## 2018-07-08 DIAGNOSIS — B20 Human immunodeficiency virus [HIV] disease: Secondary | ICD-10-CM | POA: Diagnosis not present

## 2018-07-08 DIAGNOSIS — I132 Hypertensive heart and chronic kidney disease with heart failure and with stage 5 chronic kidney disease, or end stage renal disease: Secondary | ICD-10-CM | POA: Diagnosis not present

## 2018-07-08 DIAGNOSIS — M1990 Unspecified osteoarthritis, unspecified site: Secondary | ICD-10-CM | POA: Insufficient documentation

## 2018-07-08 DIAGNOSIS — M109 Gout, unspecified: Secondary | ICD-10-CM | POA: Insufficient documentation

## 2018-07-08 DIAGNOSIS — Z8673 Personal history of transient ischemic attack (TIA), and cerebral infarction without residual deficits: Secondary | ICD-10-CM | POA: Diagnosis not present

## 2018-07-08 DIAGNOSIS — Z87891 Personal history of nicotine dependence: Secondary | ICD-10-CM | POA: Insufficient documentation

## 2018-07-08 HISTORY — PX: UPPER EXTREMITY VENOGRAPHY: CATH118272

## 2018-07-08 LAB — POCT I-STAT, CHEM 8
BUN: 43 mg/dL — ABNORMAL HIGH (ref 8–23)
CHLORIDE: 99 mmol/L (ref 98–111)
Calcium, Ion: 1.03 mmol/L — ABNORMAL LOW (ref 1.15–1.40)
Creatinine, Ser: 10.5 mg/dL — ABNORMAL HIGH (ref 0.61–1.24)
Glucose, Bld: 95 mg/dL (ref 70–99)
HEMATOCRIT: 40 % (ref 39.0–52.0)
HEMOGLOBIN: 13.6 g/dL (ref 13.0–17.0)
POTASSIUM: 4.1 mmol/L (ref 3.5–5.1)
Sodium: 141 mmol/L (ref 135–145)
TCO2: 27 mmol/L (ref 22–32)

## 2018-07-08 LAB — PROTIME-INR
INR: 2.22
PROTHROMBIN TIME: 24.4 s — AB (ref 11.4–15.2)

## 2018-07-08 SURGERY — UPPER EXTREMITY VENOGRAPHY
Anesthesia: LOCAL

## 2018-07-08 MED ORDER — IODIXANOL 320 MG/ML IV SOLN
INTRAVENOUS | Status: DC | PRN
  Administered 2018-07-08: 75 mL via INTRAVENOUS

## 2018-07-08 MED ORDER — SODIUM CHLORIDE 0.9% FLUSH: 3.0000 mL | INTRAVENOUS | Status: DC | PRN

## 2018-07-08 MED ORDER — SODIUM CHLORIDE 0.9% FLUSH: 3.0000 mL | Freq: Two times a day (BID) | INTRAVENOUS | Status: DC

## 2018-07-08 MED ORDER — SODIUM CHLORIDE 0.9 % IV SOLN: 250.0000 mL | INTRAVENOUS | Status: DC | PRN

## 2018-07-08 MED ORDER — HEPARIN (PORCINE) IN NACL 1000-0.9 UT/500ML-% IV SOLN
INTRAVENOUS | Status: DC | PRN
  Administered 2018-07-08: 500 mL

## 2018-07-08 SURGICAL SUPPLY — 2 items
STOPCOCK MORSE 400PSI 3WAY (MISCELLANEOUS) ×4
TUBING CIL FLEX 10 FLL-RA (TUBING) ×4

## 2018-07-08 NOTE — Discharge Instructions (Signed)
Venogram, Care After °This sheet gives you information about how to care for yourself after your procedure. Your health care provider may also give you more specific instructions. If you have problems or questions, contact your health care provider. °What can I expect after the procedure? °After the procedure, it is common to have: °· Bruising or mild discomfort in the area where the IV was inserted (insertion site). ° °Follow these instructions at home: °Eating and drinking °· Follow instructions from your health care provider about eating or drinking restrictions. °· Drink a lot of fluids for the first several days after the procedure, as directed by your health care provider. This helps to wash (flush) the contrast out of your body. Examples of healthy fluids include water or low-calorie drinks. °General instructions °· Check your IV insertion area every day for signs of infection. Check for: °? Redness, swelling, or pain. °? Fluid or blood. °? Warmth. °? Pus or a bad smell. °· Take over-the-counter and prescription medicines only as told by your health care provider. °· Rest and return to your normal activities as told by your health care provider. Ask your health care provider what activities are safe for you. °· Do not drive for 24 hours if you were given a medicine to help you relax (sedative), or until your health care provider approves. °· Keep all follow-up visits as told by your health care provider. This is important. °Contact a health care provider if: °· Your skin becomes itchy or you develop a rash or hives. °· You have a fever that does not get better with medicine. °· You feel nauseous. °· You vomit. °· You have redness, swelling, or pain around the insertion site. °· You have fluid or blood coming from the insertion site. °· Your insertion area feels warm to the touch. °· You have pus or a bad smell coming from the insertion site. °Get help right away if: °· You have difficulty breathing or  shortness of breath. °· You develop chest pain. °· You faint. °· You feel very dizzy. °These symptoms may represent a serious problem that is an emergency. Do not wait to see if the symptoms will go away. Get medical help right away. Call your local emergency services (911 in the [place] Do not drive yourself to the hospital. °Summary °· After your procedure, it is common to have bruising or mild discomfort in the area where the IV was inserted. °· You should check your IV insertion area every day for signs of infection. °· Take over-the-counter and prescription medicines only as told by your health care provider. °· You should drink a lot of fluids for the first several days after the procedure to help flush the contrast from your body. °This information is not intended to replace advice given to you by your health care provider. Make sure you discuss any questions you have with your health care provider. °Document Released: 09/10/2013 Document Revised: 10/14/2016 Document Reviewed: 10/14/2016 °Elsevier Interactive Patient Education © 2017 [employer] ° °

## 2018-07-08 NOTE — Op Note (Signed)
    Patient name: Ona Rathert MRN: [id number] DOB: October 23, 1941 Sex: male  07/08/2018 Pre-operative Diagnosis: esrd Post-operative diagnosis:  Same Surgeon:  Erlene Quan C. Donzetta Matters, MD Procedure Performed: Bilateral upper extremity venography  Indications: 77 year old male with history of bilateral upper extremity access that it failed.  He is currently on dialysis via right IJ catheter on Tuesday Thursdays and Saturdays.  He is indicated for bilateral upper extremity venography to plan future access.  Findings: His SVC and bilateral subclavian veins appear patent.  The right side is pain to the level of the axillary vein he does not appear to have any suitable veins for fistula creation but could have a right arm AV graft.  On the left side it appears that his subclavian vein might be filling via collaterals.  He will be considered for right upper arm AV graft versus fistula on a nondialysis day in the near future.   Procedure:  The patient was identified in the holding area and taken to room 8.  The patient was then placed supine on the table.  A timeout was called.  We then performed bilateral extremity venography.  He tolerated procedure well without immediate complication.  Contrast: 75cc   Babbie Dondlinger C. Donzetta Matters, MD [facility] [place] Office: [phone] Pager: [phone]

## 2018-07-08 NOTE — H&P (Signed)
   History and Physical Update  The patient was interviewed and re-examined.  The patient's previous History and Physical has been reviewed and is unchanged from recent office visit. Plan for bilateral upper extremity venography.   Jhada Risk C. Donzetta Matters, MD [facility] [place] Office: [phone] Pager: [phone]  07/08/2018, 9:04 AM

## 2018-07-09 ENCOUNTER — Encounter (HOSPITAL_COMMUNITY): Payer: Self-pay | Admitting: Vascular Surgery

## 2018-07-09 DIAGNOSIS — N2581 Secondary hyperparathyroidism of renal origin: Secondary | ICD-10-CM | POA: Diagnosis not present

## 2018-07-09 DIAGNOSIS — N186 End stage renal disease: Secondary | ICD-10-CM | POA: Diagnosis not present

## 2018-07-11 DIAGNOSIS — N2581 Secondary hyperparathyroidism of renal origin: Secondary | ICD-10-CM | POA: Diagnosis not present

## 2018-07-11 DIAGNOSIS — N186 End stage renal disease: Secondary | ICD-10-CM | POA: Diagnosis not present

## 2018-07-12 LAB — PROTIME-INR: INR: 3 — AB (ref 0.9–1.1)

## 2018-07-13 DIAGNOSIS — N2581 Secondary hyperparathyroidism of renal origin: Secondary | ICD-10-CM | POA: Diagnosis not present

## 2018-07-13 DIAGNOSIS — N186 End stage renal disease: Secondary | ICD-10-CM | POA: Diagnosis not present

## 2018-07-15 ENCOUNTER — Encounter: Payer: Self-pay | Admitting: Cardiology

## 2018-07-15 ENCOUNTER — Ambulatory Visit (INDEPENDENT_AMBULATORY_CARE_PROVIDER_SITE_OTHER): Payer: Medicare HMO | Admitting: Cardiology

## 2018-07-15 VITALS — BP 112/68 | HR 66 | Ht 74.0 in | Wt 194.4 lb

## 2018-07-15 DIAGNOSIS — I2510 Atherosclerotic heart disease of native coronary artery without angina pectoris: Secondary | ICD-10-CM

## 2018-07-15 DIAGNOSIS — I953 Hypotension of hemodialysis: Secondary | ICD-10-CM | POA: Diagnosis not present

## 2018-07-15 DIAGNOSIS — I420 Dilated cardiomyopathy: Secondary | ICD-10-CM

## 2018-07-15 DIAGNOSIS — N186 End stage renal disease: Secondary | ICD-10-CM

## 2018-07-15 DIAGNOSIS — Z7901 Long term (current) use of anticoagulants: Secondary | ICD-10-CM

## 2018-07-15 DIAGNOSIS — I480 Paroxysmal atrial fibrillation: Secondary | ICD-10-CM | POA: Diagnosis not present

## 2018-07-15 DIAGNOSIS — Z21 Asymptomatic human immunodeficiency virus [HIV] infection status: Secondary | ICD-10-CM | POA: Diagnosis not present

## 2018-07-15 NOTE — Patient Instructions (Signed)
Medication Instructions:  Your physician recommends that you continue on your current medications as directed. Please refer to the Current Medication list given to you today.  Labwork: NONE   Testing/Procedures: NONE   Follow-Up: FOLLOW UP AS SCHEDULED  Any Other Special Instructions Will Be Listed Below (If Applicable). If you need a refill on your cardiac medications before your next appointment, please call your pharmacy.

## 2018-07-15 NOTE — Progress Notes (Signed)
Thanks, [employer]. Agree MCr

## 2018-07-15 NOTE — Progress Notes (Signed)
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 The pt has a history of HIV/AIDS on anti viral medications, prostate cancer status post brachii therapy, ESRD, hypotension, and  nonischemic cardiomyopathy. He had a cardiac arrest in March 2019 and was resuscitated. Cardiac catheterization 02/22/18 showed nonobstructive CAD. His EF was 15-20% on echocardiogram. He also had new onset AF that admission and was placed on Amiodarone and Coumadin. His B/P runs low and we were unable to an ACE, ARB.  He has an external catheter for HD and goes Tuesday, Thursday, Saturday, [facility] follows him. Dr Sallyanne Kuster saw him  06/10/18 and added low dose Coreg on his non dialysis days. He is in the office today for follow up. He is tolerating his Coreg well. He says his B/P runs in the 90's on HD days. He denies syncope or dyspnea. Dr Donzetta Matters recently evaluated him for possible RUE AVF placement, this is to be arranged. He has a failed LUE AVF. Dr Sallyanne Kuster feels the pt is a poor candidate for an ICD because of his vascular issues and high risk for infection.     Current Outpatient Medications  Medication Sig Dispense Refill  . allopurinol (ZYLOPRIM) 100 MG tablet Take 100 mg by mouth daily.    Marland Kitchen amiodarone (PACERONE) 200 MG tablet TAKE 1 TABLET(200 MG) BY MOUTH DAILY 30 tablet 4  . brimonidine (ALPHAGAN) 0.15 % ophthalmic solution Place 1 drop into the left eye 2 (two) times daily.  3  . carvedilol (COREG) 3.125 MG tablet Take 1 tablet (3.125 mg total) by mouth 2 (two) times daily. 180 tablet 3  . DESCOVY 200-25 MG tablet Take 1 tablet by mouth every morning.  11  . dorzolamide-timolol (COSOPT) 22.3-6.8 MG/ML ophthalmic solution Place 1 drop into the left eye 2 (two) times daily.    Marland Kitchen latanoprost (XALATAN) 0.005 % ophthalmic solution Place 1 drop into the  left eye at bedtime.     . Multiple Vitamins-Minerals (ONE DAILY MENS 50+ MULTIVIT) TABS Take 1 tablet by mouth daily.    . polyethylene glycol (MIRALAX / GLYCOLAX) packet Take 17 g by mouth daily as needed for mild constipation. 14 each 0  . TIVICAY 50 MG tablet Take 50 mg by mouth every morning.  11  . Vitamin D, Ergocalciferol, (DRISDOL) 50000 UNITS CAPS capsule Take 50,000 units by mouth once a week on Saturdays  2  . warfarin (COUMADIN) 5 MG tablet Take 1 to 1 and 1/2 tablets daily as directed by coumadin clinic 45 tablet 0   No current facility-administered medications for this visit.     Allergies  Allergen Reactions  . Codeine Other (See Comments)    Causes constipation  . Cozaar [Losartan Potassium] Other (See Comments)    Causes constipation     Past Medical History:  Diagnosis Date  . Acute on chronic systolic and diastolic heart failure, NYHA class 4 ([place])   . Anemia, iron deficiency 11/15/2011  . Arthritis   . Arthritis    "hands, right knee, feet" (02/21/2018)  . Cancer [facility])    hx of prostate; s/p radioactive seed implant 10/2009 Dr Janice Norrie  . Cardiac arrest ([place]) 02/17/2018  . CHF (congestive heart failure) ([place])   . Chronic kidney disease   . Chronic lower back pain   . CKD (chronic kidney disease) stage V requiring chronic  dialysis ([place])   . ESRD on dialysis ([place])    started 02/2018  . Glaucoma   . Gout    daily RX (02/21/2018)  . Heart murmur    "mild" per pt  . History of cardiac cath 2004   negative for CAD  . History of myocardial perfusion scan 02/2010   negative for coronary insufficiency (LVEF 27%)  . HIV (human immunodeficiency virus infection) ([place])   . HIV infection ([place])   . Hyperlipidemia   . Hypertension    followed by [facility] and Vascular (Dr Dani Gobble [place])  . Hypertension   . Nonischemic cardiomyopathy ([place])   . Pneumonia 11/2017  . Prostate cancer ([place])   . Sinus bradycardia   . Stroke [facility])    "mini stroke" years ago  .  Systolic and diastolic CHF, acute ([place]) 06/2010   felt to be secondary to hypertensive cardiomyopathy    Social History   Socioeconomic History  . Marital status: Married    Spouse name: Not on file  . Number of children: 2  . Years of education: 53  . Highest education level: Not on file  Occupational History  . Occupation: retired, picks up Aeronautical engineer: RETIRED  Social Needs  . Financial resource strain: Not on file  . Food insecurity:    Worry: Not on file    Inability: Not on file  . Transportation needs:    Medical: Not on file    Non-medical: Not on file  Tobacco Use  . Smoking status: Former Smoker    Packs/day: 1.00    Years: 30.00    Pack years: 30.00    Types: Cigarettes    Last attempt to quit: 2006    Years since quitting: 13.6  . Smokeless tobacco: Never Used  Substance and Sexual Activity  . Alcohol use: Not Currently    Comment: once a week-wine  . Drug use: Never  . Sexual activity: Not Currently  Lifestyle  . Physical activity:    Days per week: Not on file    Minutes per session: Not on file  . Stress: Not on file  Relationships  . Social connections:    Talks on phone: Not on file    Gets together: Not on file    Attends religious service: Not on file    Active member of club or organization: Not on file    Attends meetings of clubs or organizations: Not on file    Relationship status: Not on file  . Intimate partner violence:    Fear of current or ex partner: Not on file    Emotionally abused: Not on file    Physically abused: Not on file    Forced sexual activity: Not on file  Other Topics Concern  . Not on file  Social History Narrative   ** Merged History Encounter **       Stays active at home Regular exercise: no Drinks 2 cups of coffee a week, 1 mountain dew soda a day.     Family History  Problem Relation Age of Onset  . Hypertension Mother   . Thyroid disease Mother   . Cholelithiasis Daughter   .  Cholelithiasis Son   . Hypertension Maternal Grandmother   . Diabetes Maternal Grandmother   . Heart attack Neg Hx   . Hyperlipidemia Neg Hx      Review of Systems: General: negative for chills, fever, night sweats or weight changes.  Cardiovascular: negative  for chest pain, dyspnea on exertion, edema, orthopnea, palpitations, paroxysmal nocturnal dyspnea or shortness of breath Dermatological: negative for rash Respiratory: negative for cough or wheezing Urologic: negative for hematuria Abdominal: negative for nausea, vomiting, diarrhea, bright red blood per rectum, melena, or hematemesis Neurologic: negative for visual changes, syncope, or dizziness All other systems reviewed and are otherwise negative except as noted above.    Blood pressure 112/68, pulse 66, height 6\' 2"  (1.88 m), weight 194 lb 6.4 oz (88.2 kg).  General appearance: alert, cooperative and no distress Lungs: clear to auscultation bilaterally Heart: regular rate and rhythm Extremities: no LE edema Skin: cool and dry Neurologic: Grossly normal  EKG NSR, LVH, 1st degree AVB, LBBB, LAD-HR 66  ASSESSMENT AND PLAN:    NICM (nonischemic cardiomyopathy) (HCC) EF 15-20% by echo March 2019  Hypotension This had limited medical Rx of his CM- he is tolerating low dose Coreg on non HD days  H/O Cardiac arrest ([place]) Pt admitted 02/17/18 with cardiac arrest  CAD (coronary artery disease) Non critical CAD at cath 02/22/18- 50% OM2, 25% LAD  ESRD (end stage renal disease) ([place]) HD- Tuesday, Thursday, and Friday  HIV (human immunodeficiency virus infection) ([place]) Followed by Dr Tommy Medal  Chronic anticoagulation On Coumadin for PAF  PLAN  I did not there was room to adjust his medications. F/U Dr Sallyanne Kuster in Dec.  Kerin Ransom PA-C 07/15/2018 10:30 AM

## 2018-07-16 DIAGNOSIS — N2581 Secondary hyperparathyroidism of renal origin: Secondary | ICD-10-CM | POA: Diagnosis not present

## 2018-07-16 DIAGNOSIS — N186 End stage renal disease: Secondary | ICD-10-CM | POA: Diagnosis not present

## 2018-07-18 DIAGNOSIS — N186 End stage renal disease: Secondary | ICD-10-CM | POA: Diagnosis not present

## 2018-07-18 DIAGNOSIS — N2581 Secondary hyperparathyroidism of renal origin: Secondary | ICD-10-CM | POA: Diagnosis not present

## 2018-07-18 LAB — PROTIME-INR: INR: 2 — AB (ref 0.9–1.1)

## 2018-07-19 ENCOUNTER — Other Ambulatory Visit: Payer: Self-pay | Admitting: *Deleted

## 2018-07-20 DIAGNOSIS — N186 End stage renal disease: Secondary | ICD-10-CM | POA: Diagnosis not present

## 2018-07-20 DIAGNOSIS — N2581 Secondary hyperparathyroidism of renal origin: Secondary | ICD-10-CM | POA: Diagnosis not present

## 2018-07-22 ENCOUNTER — Ambulatory Visit (INDEPENDENT_AMBULATORY_CARE_PROVIDER_SITE_OTHER): Payer: Medicare HMO | Admitting: Pharmacist Clinician (PhC)/ Clinical Pharmacy Specialist

## 2018-07-22 DIAGNOSIS — Z7901 Long term (current) use of anticoagulants: Secondary | ICD-10-CM

## 2018-07-22 DIAGNOSIS — I480 Paroxysmal atrial fibrillation: Secondary | ICD-10-CM

## 2018-07-22 DIAGNOSIS — Z5181 Encounter for therapeutic drug level monitoring: Secondary | ICD-10-CM

## 2018-07-23 DIAGNOSIS — N2581 Secondary hyperparathyroidism of renal origin: Secondary | ICD-10-CM | POA: Diagnosis not present

## 2018-07-23 DIAGNOSIS — N186 End stage renal disease: Secondary | ICD-10-CM | POA: Diagnosis not present

## 2018-07-25 DIAGNOSIS — N186 End stage renal disease: Secondary | ICD-10-CM | POA: Diagnosis not present

## 2018-07-25 DIAGNOSIS — N2581 Secondary hyperparathyroidism of renal origin: Secondary | ICD-10-CM | POA: Diagnosis not present

## 2018-07-27 DIAGNOSIS — N186 End stage renal disease: Secondary | ICD-10-CM | POA: Diagnosis not present

## 2018-07-27 DIAGNOSIS — N2581 Secondary hyperparathyroidism of renal origin: Secondary | ICD-10-CM | POA: Diagnosis not present

## 2018-07-30 DIAGNOSIS — N186 End stage renal disease: Secondary | ICD-10-CM | POA: Diagnosis not present

## 2018-07-30 DIAGNOSIS — N2581 Secondary hyperparathyroidism of renal origin: Secondary | ICD-10-CM | POA: Diagnosis not present

## 2018-08-01 DIAGNOSIS — N186 End stage renal disease: Secondary | ICD-10-CM | POA: Diagnosis not present

## 2018-08-01 DIAGNOSIS — N2581 Secondary hyperparathyroidism of renal origin: Secondary | ICD-10-CM | POA: Diagnosis not present

## 2018-08-01 LAB — PROTIME-INR: INR: 1.7 — AB (ref 0.9–1.1)

## 2018-08-02 ENCOUNTER — Ambulatory Visit (INDEPENDENT_AMBULATORY_CARE_PROVIDER_SITE_OTHER): Payer: Medicare HMO | Admitting: Pharmacist

## 2018-08-02 DIAGNOSIS — Z5181 Encounter for therapeutic drug level monitoring: Secondary | ICD-10-CM | POA: Diagnosis not present

## 2018-08-02 DIAGNOSIS — I480 Paroxysmal atrial fibrillation: Secondary | ICD-10-CM

## 2018-08-03 DIAGNOSIS — Z992 Dependence on renal dialysis: Secondary | ICD-10-CM | POA: Diagnosis not present

## 2018-08-03 DIAGNOSIS — B20 Human immunodeficiency virus [HIV] disease: Secondary | ICD-10-CM | POA: Diagnosis not present

## 2018-08-03 DIAGNOSIS — N186 End stage renal disease: Secondary | ICD-10-CM | POA: Diagnosis not present

## 2018-08-03 DIAGNOSIS — N2581 Secondary hyperparathyroidism of renal origin: Secondary | ICD-10-CM | POA: Diagnosis not present

## 2018-08-06 ENCOUNTER — Other Ambulatory Visit: Payer: Self-pay | Admitting: *Deleted

## 2018-08-06 ENCOUNTER — Other Ambulatory Visit: Payer: Self-pay

## 2018-08-06 ENCOUNTER — Encounter (HOSPITAL_COMMUNITY): Payer: Self-pay | Admitting: *Deleted

## 2018-08-06 DIAGNOSIS — N2581 Secondary hyperparathyroidism of renal origin: Secondary | ICD-10-CM | POA: Diagnosis not present

## 2018-08-06 DIAGNOSIS — N186 End stage renal disease: Secondary | ICD-10-CM | POA: Diagnosis not present

## 2018-08-06 NOTE — Progress Notes (Signed)
Anesthesia Chart Review: SAME DAY WORK-UP  Case:  [id number] Date/Time:  08/07/18 0815   Procedure:  INSERTION OF ARTERIOVENOUS (AV) GORE-TEX GRAFT ARM (Right )   Anesthesia type:  Choice   Pre-op diagnosis:  end stage renal disease   Location:  [facility] OR ROOM 11 / [place] OR   Surgeon:  Waynetta Sandy, MD      DISCUSSION: Patient is a 77 year old male scheduled for the above procedure. He undergoes hemodialysis TTS at [facility].  History includes former smoker (quit '06), HIV/AIDS (on anti-viral medications), prostate cancer (s/p I-125 seed implantation 11/12/09, Dr. Janice Norrie), non-ischemic cardiomyopathy, chronic combined systolic and diastolic CHF, afib/PAF (new onset 02/2018), hypotension, ESRD (HD TTS, started 02/2018), anemia, TIA (remote history), glaucoma. - Hospitalized 02/17/18-03/09/18 for acute respiratory failure and PEA/asystolic arrest (PEA arrest felt secondary to respiratory distress/hypoxia). He woke up at home with acute SOB. Wife drove him to ED, but on arrival found to be unresponsive with agonal respirations. Chest compression started by ED tech, but once in trauma room (< 10 minutes), pulse noted with HR in the 40's which improved after intubation. Found to have pulmonary edema (with underlying chronic systolic and diastolic CHF and worsening CKD stage V) and possible pneumonia. Cardiology and nephrology were consulted. Hemodialysis initiated via right IJ tunneled dialysis catheter 02/18/18. Extubated 02/19/18. EF 15-20% (down from 20-25% 01/2018). 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 Vascular surgery consulted for new permanent hemodialysis access (previously failed 2 access attempts). He underwent LUE AVGG 02/25/18 and thrombectomy 02/26/18. Also developed new onset afib during hospitalization and was started on amiodarone and warfarin. Hypotension limited medication titration for CHF. He was not felt to be a candidate for ICD.  Per 07/15/18 cardiology  note by Kerin Ransom, PA-C, "Dr Sallyanne Kuster feels the pt is a poor candidate for an ICD because of his vascular issues and high risk for infection." He is aware of future hemodialysis access plans. No further cardiac testing recommended prior to his procedures on 02/25/18 and 02/26/18.  Last warfarin 08/03/18 per [facility]. He is a same day work-up, so further evaluation on the day of surgery, but if no acute CV/CHF symptoms and labs acceptable then I would anticipate that he can proceed as planned.   VS: For day of surgery.  PROVIDERS: [name], NP is PCP Alyson Locket, MD is cardiologist. Last visit 07/15/18 with Kerin Ransom, PA-C.  Alcide Evener, MD is ID Elmarie Shiley, MD is nephrologist ([place]) Metta Clines, DO is neurologist   LABS: He is for labs on the day of surgery, including INR. A1c 6.1 on 01/21/18.    IMAGES: CXR 04/30/18: IMPRESSION: Mild peribronchial thickening noted; lungs otherwise clear.  CTA chest 02/17/18: IMPRESSION: 1. Multifocal bilateral consolidative, ground-glass, nodular airspace opacities throughout both lungs. Minimal debris in the distal trachea and right mainstem bronchus. Overall findings suggest pneumonia with probable aspiration. Degree of pulmonary edema is also considered. Ground-glass opacities in a patient with HIV can be seen with pneumocystis pneumonia , however consolidative opacities are the prominent pattern. 2. Cardiomegaly with aortic atherosclerosis.   EKG: 07/15/18: SR with first degree AV block, LAD, left BBB.   CV: Cardiac cath 02/20/18:  Colon Flattery 2nd Mrg to 2nd Mrg lesion is 50% stenosed.  Mid LAD lesion is 25% stenosed.  LV end diastolic pressure is normal. LVEDP 6 mm Hg.  There is no aortic valve stenosis.  Nonobstructive CAD.  Continue medical therapy.   Echo 02/18/18: Study Conclusions -  Left ventricle: The cavity size was moderately dilated. There was   moderate concentric hypertrophy. Systolic  function was normal.   The estimated ejection fraction was in the range of 15% to 20%.   Doppler parameters are consistent with abnormal left ventricular   relaxation (grade 1 diastolic dysfunction). - Ventricular septum: Septal motion showed paradox. - Aortic valve: There was mild regurgitation. - Left atrium: The atrium was mildly dilated. Impressions: - No prior study available for comparison. LV is moderately dilated   with severely decreased LVEF estimated at 15-20%. There is   diffuse hypokinesis with akinesis at the inferior and   inferolateral walls. RV is poorly visualized but appears to have   normal size and systolic function. (Comparison EF 20-25% 01/22/18 & 11/25/17; 25-30% 08/08/17; 45-50% 02/06/12)  BUE venography 07/08/18:  Findings: His SVC and bilateral subclavian veins appear patent.  The right side is pain to the level of the axillary vein he does not appear to have any suitable veins for fistula creation but could have a right arm AV graft.  On the left side it appears that his subclavian vein might be filling via collaterals. He will be considered for right upper arm AV graft versus fistula on a nondialysis day in the near future.  Past Medical History:  Diagnosis Date  . Acute on chronic systolic and diastolic heart failure, NYHA class 4 ([place])   . Anemia, iron deficiency 11/15/2011  . Arthritis   . Arthritis    "hands, right knee, feet" (02/21/2018)  . Cancer [name])    hx of prostate; s/p radioactive seed implant 10/2009 Dr Janice Norrie  . Cardiac arrest ([place]) 02/17/2018  . CHF (congestive heart failure) ([place])   . Chronic kidney disease   . Chronic lower back pain   . CKD (chronic kidney disease) stage V requiring chronic dialysis ([place])   . ESRD on dialysis ([place])    started 02/2018  . Glaucoma   . Gout    daily RX (02/21/2018)  . Heart murmur    "mild" per pt  . History of cardiac cath 2004   negative for CAD  . History of myocardial perfusion scan 02/2010   negative for  coronary insufficiency (LVEF 27%)  . HIV (human immunodeficiency virus infection) ([place])   . HIV infection ([place])   . Hyperlipidemia   . Hypertension    followed by [facility] and Vascular (Dr Dani Gobble [place]itoru)  . Hypertension   . Nonischemic cardiomyopathy ([place])   . Pneumonia 11/2017  . Prostate cancer ([place])   . Sinus bradycardia   . Stroke [facility])    "mini stroke" years ago  . Systolic and diastolic CHF, acute ([place]) 06/2010   felt to be secondary to hypertensive cardiomyopathy    Past Surgical History:  Procedure Laterality Date  . AV FISTULA PLACEMENT Right 10/13/2016   Procedure: ARTERIOVENOUS (AV) FISTULA CREATION;  Surgeon: [name], MD;  Location: [place];  Service: Vascular;  Laterality: Right;  . AV FISTULA PLACEMENT Right 10/13/2016   [name] [phone]  . AV FISTULA PLACEMENT Left 02/25/2018   Procedure: INSERTION OF ARTERIOVENOUS (AV) GORE-TEX GRAFT LEFT UPPER ARM;  Surgeon: Angelia Mould, MD;  Location: [place];  Service: Vascular;  Laterality: Left;  . BASCILIC VEIN TRANSPOSITION Left 06/24/2015   Procedure: BASILIC VEIN TRANSPOSITION;  Surgeon: Mal Misty, MD;  Location: [place];  Service: Vascular;  Laterality: Left;  . BASCILIC VEIN TRANSPOSITION Left 3/89/3734 Bascilic vein transposition (Left)   [name] [phone]  .  CARDIAC CATHETERIZATION  02/04/2003   mildly depressed LV systolic fx EF 72%,[id number] coronaries/abdominal aorta/renal arteries.  Marland Kitchen CARDIAC CATHETERIZATION  2004  . CATARACT EXTRACTION, BILATERAL Bilateral   . COLONOSCOPY    . ESOPHAGOGASTRODUODENOSCOPY (EGD) WITH PROPOFOL N/A 05/05/2014   Procedure: ESOPHAGOGASTRODUODENOSCOPY (EGD) WITH PROPOFOL;  Surgeon: Missy Sabins, MD;  Location: [facility];  Service: Endoscopy;  Laterality: N/A;  . EYE SURGERY Bilateral    cataract surgery   . EYE SURGERY Bilateral    glaucoma surgery  . EYE SURGERY    . GLAUCOMA SURGERY Bilateral   . INSERTION PROSTATE RADIATION SEED    . IR FLUORO GUIDE CV LINE RIGHT   02/18/2018  . IR US GUIDE VASC ACCESS RIGHT  02/18/2018  . LEFT HEART CATH AND CORONARY ANGIOGRAPHY N/A 02/20/2018   Procedure: LEFT HEART CATH AND CORONARY ANGIOGRAPHY;  Surgeon: Jettie Booze, MD;  Location: [place] CV LAB;  Service: Cardiovascular;  Laterality: N/A;  . NM MYOCAR PERF WALL MOTION  02/21/2010   normal  . RADIOACTIVE SEED IMPLANT  2010   prostate cancer  . THROMBECTOMY W/ EMBOLECTOMY Left 02/26/2018   Procedure: THROMBECTOMY ARTERIOVENOUS GRAFT;  Surgeon: Angelia Mould, MD;  Location: [place];  Service: Vascular;  Laterality: Left;  . ULTRASOUND GUIDANCE FOR VASCULAR ACCESS  02/20/2018   Procedure: Ultrasound Guidance For Vascular Access;  Surgeon: Jettie Booze, MD;  Location: [place] CV LAB;  Service: Cardiovascular;;  . UPPER EXTREMITY VENOGRAPHY N/A 07/08/2018   Procedure: UPPER EXTREMITY VENOGRAPHY;  Surgeon: Waynetta Sandy, MD;  Location: [place] CV LAB;  Service: Cardiovascular;  Laterality: N/A;  Bilateral  . US ECHOCARDIOGRAPHY  02/06/2012   mild LVH,LA mod. dilated,mild-mod. 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 AI.    MEDICATIONS: No current facility-administered medications for this encounter.    Marland Kitchen allopurinol (ZYLOPRIM) 100 MG tablet  . amiodarone (PACERONE) 200 MG tablet  . brimonidine (ALPHAGAN) 0.15 % ophthalmic solution  . DESCOVY 200-25 MG tablet  . dorzolamide-timolol (COSOPT) 22.3-6.8 MG/ML ophthalmic solution  . HYDROcodone-acetaminophen (NORCO/VICODIN) 5-325 MG tablet  . latanoprost (XALATAN) 0.005 % ophthalmic solution  . midodrine (PROAMATINE) 10 MG tablet  . multivitamin (RENA-VIT) TABS tablet  . TIVICAY 50 MG tablet  . Vitamin D, Ergocalciferol, (DRISDOL) 50000 UNITS CAPS capsule  . warfarin (COUMADIN) 5 MG tablet  . carvedilol (COREG) 3.125 MG tablet  . polyethylene glycol (MIRALAX / GLYCOLAX) packet    George Hugh [facility] Short Stay Center/Anesthesiology Phone [phone] 08/06/2018 11:02 AM

## 2018-08-06 NOTE — Progress Notes (Signed)
Spoke with pt for pre-op call. Pt has hx of non-obstructive CAD and A-fib. [name] is his cardiologist. Pt denies any recent chest pain or sob. Pt states his last dose of Coumadin was 08/06/18. Was instructed to stop 3 days prior to surgery. Pt states he is not diabetic.

## 2018-08-07 ENCOUNTER — Ambulatory Visit (HOSPITAL_COMMUNITY)
Admission: RE | Admit: 2018-08-07 | Discharge: 2018-08-07 | Disposition: A | Discharge status: Home / Self Care | Payer: Medicare HMO | Source: Ambulatory Visit | Attending: Vascular Surgery | Admitting: Vascular Surgery

## 2018-08-07 ENCOUNTER — Encounter (HOSPITAL_COMMUNITY)
Admission: RE | Disposition: A | Discharge status: Home / Self Care | Payer: Self-pay | Source: Ambulatory Visit | Attending: Vascular Surgery

## 2018-08-07 ENCOUNTER — Encounter (HOSPITAL_COMMUNITY): Payer: Self-pay

## 2018-08-07 ENCOUNTER — Ambulatory Visit (HOSPITAL_COMMUNITY): Payer: Medicare HMO | Admitting: Vascular Surgery

## 2018-08-07 DIAGNOSIS — N186 End stage renal disease: Secondary | ICD-10-CM | POA: Insufficient documentation

## 2018-08-07 DIAGNOSIS — Z8379 Family history of other diseases of the digestive system: Secondary | ICD-10-CM | POA: Diagnosis not present

## 2018-08-07 DIAGNOSIS — B20 Human immunodeficiency virus [HIV] disease: Secondary | ICD-10-CM | POA: Insufficient documentation

## 2018-08-07 DIAGNOSIS — Z87891 Personal history of nicotine dependence: Secondary | ICD-10-CM | POA: Diagnosis not present

## 2018-08-07 DIAGNOSIS — I132 Hypertensive heart and chronic kidney disease with heart failure and with stage 5 chronic kidney disease, or end stage renal disease: Secondary | ICD-10-CM | POA: Insufficient documentation

## 2018-08-07 DIAGNOSIS — Z9889 Other specified postprocedural states: Secondary | ICD-10-CM | POA: Insufficient documentation

## 2018-08-07 DIAGNOSIS — Z7901 Long term (current) use of anticoagulants: Secondary | ICD-10-CM | POA: Diagnosis not present

## 2018-08-07 DIAGNOSIS — Z885 Allergy status to narcotic agent status: Secondary | ICD-10-CM | POA: Insufficient documentation

## 2018-08-07 DIAGNOSIS — N185 Chronic kidney disease, stage 5: Secondary | ICD-10-CM | POA: Diagnosis not present

## 2018-08-07 DIAGNOSIS — Z8546 Personal history of malignant neoplasm of prostate: Secondary | ICD-10-CM | POA: Insufficient documentation

## 2018-08-07 DIAGNOSIS — Z7982 Long term (current) use of aspirin: Secondary | ICD-10-CM | POA: Insufficient documentation

## 2018-08-07 DIAGNOSIS — I5023 Acute on chronic systolic (congestive) heart failure: Secondary | ICD-10-CM | POA: Diagnosis not present

## 2018-08-07 DIAGNOSIS — H409 Unspecified glaucoma: Secondary | ICD-10-CM | POA: Insufficient documentation

## 2018-08-07 DIAGNOSIS — Z79899 Other long term (current) drug therapy: Secondary | ICD-10-CM | POA: Insufficient documentation

## 2018-08-07 DIAGNOSIS — Z8249 Family history of ischemic heart disease and other diseases of the circulatory system: Secondary | ICD-10-CM | POA: Insufficient documentation

## 2018-08-07 DIAGNOSIS — M109 Gout, unspecified: Secondary | ICD-10-CM | POA: Diagnosis not present

## 2018-08-07 DIAGNOSIS — Z8673 Personal history of transient ischemic attack (TIA), and cerebral infarction without residual deficits: Secondary | ICD-10-CM | POA: Diagnosis not present

## 2018-08-07 DIAGNOSIS — Z955 Presence of coronary angioplasty implant and graft: Secondary | ICD-10-CM | POA: Insufficient documentation

## 2018-08-07 DIAGNOSIS — Z8674 Personal history of sudden cardiac arrest: Secondary | ICD-10-CM | POA: Insufficient documentation

## 2018-08-07 DIAGNOSIS — M1990 Unspecified osteoarthritis, unspecified site: Secondary | ICD-10-CM | POA: Diagnosis not present

## 2018-08-07 DIAGNOSIS — E785 Hyperlipidemia, unspecified: Secondary | ICD-10-CM | POA: Diagnosis not present

## 2018-08-07 DIAGNOSIS — I429 Cardiomyopathy, unspecified: Secondary | ICD-10-CM | POA: Diagnosis not present

## 2018-08-07 DIAGNOSIS — Z992 Dependence on renal dialysis: Secondary | ICD-10-CM | POA: Diagnosis not present

## 2018-08-07 DIAGNOSIS — Z888 Allergy status to other drugs, medicaments and biological substances status: Secondary | ICD-10-CM | POA: Insufficient documentation

## 2018-08-07 HISTORY — DX: Gastro-esophageal reflux disease without esophagitis: K21.9

## 2018-08-07 HISTORY — PX: AV FISTULA PLACEMENT: SHX1204

## 2018-08-07 HISTORY — DX: Unspecified atrial fibrillation: I48.91

## 2018-08-07 HISTORY — DX: Inflammatory liver disease, unspecified: K75.9

## 2018-08-07 HISTORY — DX: Sleep apnea, unspecified: G47.30

## 2018-08-07 LAB — POCT I-STAT 4, (NA,K, GLUC, HGB,HCT)
Glucose, Bld: 102 mg/dL — ABNORMAL HIGH (ref 70–99)
HCT: 38 % — ABNORMAL LOW (ref 39.0–52.0)
Hemoglobin: 12.9 g/dL — ABNORMAL LOW (ref 13.0–17.0)
Potassium: 4 mmol/L (ref 3.5–5.1)
Sodium: 136 mmol/L (ref 135–145)

## 2018-08-07 LAB — SURGICAL PCR SCREEN
MRSA, PCR: NEGATIVE
Staphylococcus aureus: NEGATIVE

## 2018-08-07 LAB — PROTIME-INR
INR: 1.82
PROTHROMBIN TIME: 21 s — AB (ref 11.4–15.2)

## 2018-08-07 SURGERY — INSERTION OF ARTERIOVENOUS (AV) GORE-TEX GRAFT ARM
Anesthesia: General | Site: Arm Lower | Laterality: Right

## 2018-08-07 MED ORDER — PROPOFOL 10 MG/ML IV BOLUS
INTRAVENOUS | Status: AC
  Filled 2018-08-07: qty 20

## 2018-08-07 MED ORDER — SODIUM CHLORIDE 0.9 % IV SOLN
INTRAVENOUS | Status: DC | PRN
  Administered 2018-08-07: 500 mL

## 2018-08-07 MED ORDER — MIDAZOLAM HCL 2 MG/2ML IJ SOLN
INTRAMUSCULAR | Status: AC
  Filled 2018-08-07: qty 2

## 2018-08-07 MED ORDER — FENTANYL CITRATE (PF) 100 MCG/2ML IJ SOLN: 25.0000 ug | INTRAMUSCULAR | Status: DC | PRN

## 2018-08-07 MED ORDER — FENTANYL CITRATE (PF) 100 MCG/2ML IJ SOLN
INTRAMUSCULAR | Status: DC | PRN
  Administered 2018-08-07: 25 ug via INTRAVENOUS

## 2018-08-07 MED ORDER — CEFAZOLIN SODIUM-DEXTROSE 2-4 GM/100ML-% IV SOLN
2.0000 g | INTRAVENOUS | Status: AC
  Administered 2018-08-07: 2 g via INTRAVENOUS

## 2018-08-07 MED ORDER — MIDAZOLAM HCL 2 MG/2ML IJ SOLN
INTRAMUSCULAR | Status: DC | PRN
  Administered 2018-08-07: 1 mg via INTRAVENOUS

## 2018-08-07 MED ORDER — PHENYLEPHRINE HCL 10 MG/ML IJ SOLN
INTRAMUSCULAR | Status: DC | PRN
  Administered 2018-08-07: 40 ug via INTRAVENOUS

## 2018-08-07 MED ORDER — MUPIROCIN 2 % EX OINT
1.0000 "application " | TOPICAL_OINTMENT | Freq: Once | CUTANEOUS | Status: AC
  Administered 2018-08-07: 1 via TOPICAL

## 2018-08-07 MED ORDER — FENTANYL CITRATE (PF) 250 MCG/5ML IJ SOLN
INTRAMUSCULAR | Status: AC
  Filled 2018-08-07: qty 5

## 2018-08-07 MED ORDER — SODIUM CHLORIDE 0.9 % IV SOLN
INTRAVENOUS | Status: AC
  Filled 2018-08-07: qty 1.2

## 2018-08-07 MED ORDER — SODIUM CHLORIDE 0.9 % IV SOLN
INTRAVENOUS | Status: DC
  Administered 2018-08-07: 08:00:00 via INTRAVENOUS

## 2018-08-07 MED ORDER — PROPOFOL 500 MG/50ML IV EMUL
INTRAVENOUS | Status: DC | PRN
  Administered 2018-08-07: 75 ug/kg/min via INTRAVENOUS

## 2018-08-07 MED ORDER — LIDOCAINE-EPINEPHRINE (PF) 1 %-1:200000 IJ SOLN
INTRAMUSCULAR | Status: AC
  Filled 2018-08-07: qty 30

## 2018-08-07 MED ORDER — LIDOCAINE-EPINEPHRINE (PF) 1 %-1:200000 IJ SOLN
INTRAMUSCULAR | Status: DC | PRN
  Administered 2018-08-07: 10 mL

## 2018-08-07 MED ORDER — LIDOCAINE HCL (CARDIAC) PF 100 MG/5ML IV SOSY
PREFILLED_SYRINGE | INTRAVENOUS | Status: DC | PRN
  Administered 2018-08-07: 100 mg via INTRAVENOUS

## 2018-08-07 MED ORDER — MUPIROCIN 2 % EX OINT
TOPICAL_OINTMENT | CUTANEOUS | Status: AC
  Administered 2018-08-07: 1 via TOPICAL
  Filled 2018-08-07: qty 22

## 2018-08-07 MED ORDER — HYDROCODONE-ACETAMINOPHEN 5-325 MG PO TABS: 1.0000 | ORAL_TABLET | Freq: Four times a day (QID) | ORAL | 0 refills | Status: DC | PRN

## 2018-08-07 MED ORDER — 0.9 % SODIUM CHLORIDE (POUR BTL) OPTIME
TOPICAL | Status: DC | PRN
  Administered 2018-08-07: 1000 mL

## 2018-08-07 SURGICAL SUPPLY — 39 items
ADH SKN CLS APL DERMABOND .7 (GAUZE/BANDAGES/DRESSINGS) ×1
ARMBAND PINK RESTRICT EXTREMIT (MISCELLANEOUS) ×6 IMPLANT
CANISTER SUCT 3000ML PPV (MISCELLANEOUS) ×3 IMPLANT
CLIP VESOCCLUDE MED 6/CT (CLIP) ×3 IMPLANT
CLIP VESOCCLUDE SM WIDE 6/CT (CLIP) ×3 IMPLANT
DERMABOND ADVANCED (GAUZE/BANDAGES/DRESSINGS) ×2
DERMABOND ADVANCED .7 DNX12 (GAUZE/BANDAGES/DRESSINGS) ×1 IMPLANT
ELECT REM PT RETURN 9FT ADLT (ELECTROSURGICAL) ×3
ELECTRODE REM PT RTRN 9FT ADLT (ELECTROSURGICAL) ×1 IMPLANT
GLOVE BIO SURGEON STRL SZ 6.5 (GLOVE) ×1 IMPLANT
GLOVE BIO SURGEON STRL SZ7.5 (GLOVE) ×3 IMPLANT
GLOVE BIO SURGEONS STRL SZ 6.5 (GLOVE) ×1
GLOVE BIOGEL PI IND STRL 7.0 (GLOVE) IMPLANT
GLOVE BIOGEL PI IND STRL 7.5 (GLOVE) IMPLANT
GLOVE BIOGEL PI INDICATOR 7.0 (GLOVE) ×6
GLOVE BIOGEL PI INDICATOR 7.5 (GLOVE) ×2
GLOVE ECLIPSE 7.0 STRL STRAW (GLOVE) ×2 IMPLANT
GOWN STRL REUS W/ TWL LRG LVL3 (GOWN DISPOSABLE) ×2 IMPLANT
GOWN STRL REUS W/ TWL XL LVL3 (GOWN DISPOSABLE) ×1 IMPLANT
GOWN STRL REUS W/TWL LRG LVL3 (GOWN DISPOSABLE) ×6
GOWN STRL REUS W/TWL XL LVL3 (GOWN DISPOSABLE) ×3
HEMOSTAT SNOW SURGICEL 2X4 (HEMOSTASIS) IMPLANT
INSERT FOGARTY SM (MISCELLANEOUS) ×3 IMPLANT
KIT BASIN OR (CUSTOM PROCEDURE TRAY) ×3 IMPLANT
KIT TURNOVER KIT B (KITS) ×3 IMPLANT
NS IRRIG 1000ML POUR BTL (IV SOLUTION) ×3 IMPLANT
PACK CV ACCESS (CUSTOM PROCEDURE TRAY) ×3 IMPLANT
PAD ARMBOARD 7.5X6 YLW CONV (MISCELLANEOUS) ×6 IMPLANT
SUT GORETEX 6.0 TH-9 30 IN (SUTURE) IMPLANT
SUT GORETEX CV-6TTC-13 36IN (SUTURE) IMPLANT
SUT MNCRL AB 4-0 PS2 18 (SUTURE) ×6 IMPLANT
SUT PROLENE 6 0 BV (SUTURE) IMPLANT
SUT SILK 2 0 SH (SUTURE) IMPLANT
SUT VIC AB 3-0 SH 27 (SUTURE) ×6
SUT VIC AB 3-0 SH 27X BRD (SUTURE) ×2 IMPLANT
SYR TOOMEY 50ML (SYRINGE) IMPLANT
TOWEL GREEN STERILE (TOWEL DISPOSABLE) ×3 IMPLANT
UNDERPAD 30X30 (UNDERPADS AND DIAPERS) ×3 IMPLANT
WATER STERILE IRR 1000ML POUR (IV SOLUTION) ×3 IMPLANT

## 2018-08-07 NOTE — Op Note (Signed)
    Patient name: David [name] MRN: [id number] DOB: October 07, 1941 Sex: male  08/07/2018 Pre-operative Diagnosis: End-stage renal disease Post-operative diagnosis:  Same Surgeon:  Erlene Quan C. Donzetta Matters, MD Assistant: Laurence Slate, PA Procedure Performed: Right first stage basilic vein AV fistula creation  Indications: 77 year old male with end-stage renal disease currently on dialysis via a catheter in the right IJ.  He has history of bilateral upper extremity access procedures including a failed cephalic vein fistula on the right.  He recently underwent venography which demonstrated patent axillary vein on the right with occluded central venous structures on the left.  There is a possible basilic vein for fistula creation on this venogram.  He is now indicated for right upper extremity fistula versus graft creation.  Findings: The basilic vein by ultrasound measured 4 mm in the upper arm this was consistent with open evaluation and easily dilated to 4 mm.  Brachial artery above the antecubitum was free of disease.  At completion there was a strong thrill in the runoff vein.    Patient will need transposition when this fistula matures.   Procedure:  The patient was identified in the holding area and taken to the operating room where is placed supine operating table and MAC anesthesia was induced.  He was sterilely prepped and draped the right upper extremity usual fashion he was given antibiotics and a timeout was called.  We began by using ultrasound to evaluate the basilic vein the upper arm which was noted to be 4 mm and was patent throughout its course up to the level of joining the axillary vein prior to the axilla.  With this we then anesthetized the arm with 1% lidocaine with epinephrine overlying the vein and the brachial artery and a curvilinear incision was made between the 2 above the antecubitum.  I first dissected down identified the vein dissected out marker for orientation.  We then  dissected out the artery placed a vessel loop around this.  The vein was then transected spatulated and dilated to 4 mm serially.  It was flushed with heparinized saline and clamped.  The artery was clamped distally and proximally opened longitudinally and flushed with heparinized saline both directions.  The vein was then sewn end-to-side with 6-0 Prolene suture.  Prior to completion of anastomosis we allowed flushing in all directions.  Upon completion we were noted to have pulsatility in the vein so we dissected out dividing some soft tissue attachments and branches.  We then had a palpable thrill and this was confirmed with Doppler.  We also a palpable radial pulse at the wrist also confirmed with Doppler.  Satisfied with this we irrigated the wound obtained hemostasis closed in layers with Vicryl and Monocryl.  He was allowed away from anesthesia having tolerated procedure well with out immediate complication.  All counts were correct at completion.  Next  EBL 10 cc.   Mariel Lukins C. Donzetta Matters, MD [facility] [place] Office: [phone] Pager: [phone]

## 2018-08-07 NOTE — Transfer of Care (Signed)
Immediate Anesthesia Transfer of Care Note  Patient: [facility]  Procedure(s) Performed: Creation of right arm brachiocephalic Fistula (Right Arm Lower)  Patient Location: PACU  Anesthesia Type:MAC  Level of Consciousness: awake, alert  and oriented  Airway & Oxygen Therapy: Patient Spontanous Breathing  Post-op Assessment: Report given to RN, Post -op Vital signs reviewed and stable and Patient moving all extremities  Post vital signs: Reviewed and stable  Last Vitals:  Vitals Value Taken Time  BP 112/61 08/07/2018  9:37 AM  Temp    Pulse 65 08/07/2018  9:40 AM  Resp 19 08/07/2018  9:40 AM  SpO2 99 % 08/07/2018  9:40 AM  Vitals shown include unvalidated device data.  Last Pain:  Vitals:   08/07/18 0703  TempSrc: Oral  PainSc: 0-No pain      Patients Stated Pain Goal: 3 (76/81/15 7262)  Complications: No apparent anesthesia complications

## 2018-08-07 NOTE — Discharge Instructions (Signed)
° °  [facility] [place] ° °Discharge Instructions ° °AV Fistula or Graft Surgery for Dialysis Access ° °Please refer to the following instructions for your post-procedure care. Your surgeon or physician assistant will discuss any changes with you. ° °Activity ° °You may drive the day following your surgery, if you are comfortable and no longer taking prescription pain medication. Resume full activity as the soreness in your incision resolves. ° °Bathing/Showering ° °You may shower after you go home. Keep your incision dry for 48 hours. Do not soak in a bathtub, hot tub, or swim until the incision heals completely. You may not shower if you have a hemodialysis catheter. ° °Incision Care ° °Clean your incision with mild soap and water after 48 hours. Pat the area dry with a clean towel. You do not need a bandage unless otherwise instructed. Do not apply any ointments or creams to your incision. You may have skin glue on your incision. Do not peel it off. It will come off on its own in about one week. Your arm may swell a bit after surgery. To reduce swelling use pillows to elevate your arm so it is above your heart. Your doctor will tell you if you need to lightly wrap your arm with an ACE bandage. ° °Diet ° °Resume your normal diet. There are not special food restrictions following this procedure. In order to heal from your surgery, it is CRITICAL to get adequate nutrition. Your body requires vitamins, minerals, and protein. Vegetables are the best source of vitamins and minerals. Vegetables also provide the perfect balance of protein. Processed food has little nutritional value, so try to avoid this. ° °Medications ° °Resume taking all of your medications. If your incision is causing pain, you may take over-the counter pain relievers such as acetaminophen (Tylenol). If you were prescribed a stronger pain medication, please be aware these medications can cause nausea and constipation. Prevent  nausea by taking the medication with a snack or meal. Avoid constipation by drinking plenty of fluids and eating foods with high amount of fiber, such as fruits, vegetables, and grains. Do not take Tylenol if you are taking prescription pain medications. ° ° ° ° °Follow up °Your surgeon may want to see you in the office following your access surgery. If so, this will be arranged at the time of your surgery. ° °Please call us immediately for any of the following conditions: ° °Increased pain, redness, drainage (pus) from your incision site °Fever of 101 degrees or higher °Severe or worsening pain at your incision site °Hand pain or numbness. ° °Reduce your risk of vascular disease: ° °Stop smoking. If you would like help, call [facility] at 1-800-QUIT-NOW (1-800-784-8669) or [place] at 336-586-4000 ° °Manage your cholesterol °Maintain a desired weight °Control your diabetes °Keep your blood pressure down ° °Dialysis ° °It will take several weeks to several months for your new dialysis access to be ready for use. Your surgeon will determine when it is OK to use it. Your nephrologist will continue to direct your dialysis. You can continue to use your Permcath until your new access is ready for use. ° °If you have any questions, please call the office at [phone]. ° °

## 2018-08-07 NOTE — H&P (Signed)
H+P   History of Present Illness  [facility] David [name] is a 77 y.o. (1941/07/15) male who presents for re-evaluation for permanent access. The patient is right hand dominant. Previous access procedures have been completed in both arms. The patient's complication from previous access procedures include: thrombosis. The patient has never had a previous PPM placed. He currently has RIJV TDC.      Past Medical History:  Diagnosis Date  . Acute on chronic systolic and diastolic heart failure, NYHA class 4 ([place])   . Anemia, iron deficiency 11/15/2011  . Arthritis   . Arthritis    "hands, right knee, feet" (02/21/2018)  . Cancer [facility])    hx of prostate; s/p radioactive seed implant 10/2009 Dr Janice Norrie  . Cardiac arrest ([place]) 02/17/2018  . CHF (congestive heart failure) ([place])   . Chronic kidney disease   . Chronic lower back pain   . CKD (chronic kidney disease) stage V requiring chronic dialysis ([place])   . ESRD on dialysis ([place])    started 02/2018  . Glaucoma   . Gout    daily RX (02/21/2018)  . Heart murmur    "mild" per pt  . History of cardiac cath 2004   negative for CAD  . History of myocardial perfusion scan 02/2010   negative for coronary insufficiency (LVEF 27%)  . HIV (human immunodeficiency virus infection) ([place])   . HIV infection ([place])   . Hyperlipidemia   . Hypertension    followed by [facility] Vascular (Dr Dani Gobble Croitoru)  . Hypertension   . Nonischemic cardiomyopathy ([place])   . Pneumonia 11/2017  . Prostate cancer ([place])   . Sinus bradycardia   . Stroke [facility])    "mini stroke" years ago  . Systolic and diastolic CHF, acute ([place]) 06/2010   felt to be secondary to hypertensive cardiomyopathy        Past Surgical History:  Procedure Laterality Date  . AV FISTULA PLACEMENT Right 10/13/2016   Procedure: ARTERIOVENOUS (AV) FISTULA CREATION; Surgeon: [name], MD; Location: [place]; Service: Vascular; Laterality: Right;  . AV FISTULA PLACEMENT Right  10/13/2016   [name] [id number]  . AV FISTULA PLACEMENT Left 02/25/2018   Procedure: INSERTION OF ARTERIOVENOUS (AV) GORE-TEX GRAFT LEFT UPPER ARM; Surgeon: Angelia Mould, MD; Location: [place]; Service: Vascular; Laterality: Left;  . BASCILIC VEIN TRANSPOSITION Left 06/24/2015   Procedure: BASILIC VEIN TRANSPOSITION; Surgeon: Mal Misty, MD; Location: [place]; Service: Vascular; Laterality: Left;  . BASCILIC VEIN TRANSPOSITION Left 3/71/6967 Bascilic vein transposition (Left)   [name] [id number]  . CARDIAC CATHETERIZATION  02/04/2003   mildly depressed LV systolic fx EF 10%,CHENID coronaries/abdominal aorta/renal arteries.  Marland Kitchen CARDIAC CATHETERIZATION  2004  . CATARACT EXTRACTION, BILATERAL Bilateral   . COLONOSCOPY    . ESOPHAGOGASTRODUODENOSCOPY (EGD) WITH PROPOFOL N/A 05/05/2014   Procedure: ESOPHAGOGASTRODUODENOSCOPY (EGD) WITH PROPOFOL; Surgeon: Missy Sabins, MD; Location: [facility]; Service: Endoscopy; Laterality: N/A;  . EYE SURGERY Bilateral    cataract surgery   . EYE SURGERY Bilateral    glaucoma surgery  . EYE SURGERY    . GLAUCOMA SURGERY Bilateral   . INSERTION PROSTATE RADIATION SEED    . IR FLUORO GUIDE CV LINE RIGHT  02/18/2018  . IR US GUIDE VASC ACCESS RIGHT  02/18/2018  . LEFT HEART CATH AND CORONARY ANGIOGRAPHY N/A 02/20/2018   Procedure: LEFT HEART CATH AND CORONARY ANGIOGRAPHY; Surgeon: Jettie Booze, MD; Location: [place] [facility]; Service: Cardiovascular; Laterality: N/A;  .  NM MYOCAR PERF WALL MOTION  02/21/2010   normal  . RADIOACTIVE SEED IMPLANT  2010   prostate cancer  . THROMBECTOMY W/ EMBOLECTOMY Left 02/26/2018   Procedure: THROMBECTOMY ARTERIOVENOUS GRAFT; Surgeon: Angelia Mould, MD; Location: [place]; Service: Vascular; Laterality: Left;  . ULTRASOUND GUIDANCE FOR VASCULAR ACCESS  02/20/2018   Procedure: Ultrasound Guidance For Vascular Access; Surgeon: Jettie Booze, MD; Location: [place] [facility]; Service: Cardiovascular;;  . US  ECHOCARDIOGRAPHY  02/06/2012   mild LVH,LA mod. dilated,mild-mod. 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 AI.   Social History        Socioeconomic History  . Marital status: Married    Spouse name: Not on file  . Number of children: 2  . Years of education: 11  . Highest education level: Not on file  Occupational History  . Occupation: retired, picks up Aeronautical engineer: RETIRED  Social Needs  . Financial resource strain: Not on file  . Food insecurity:    Worry: Not on file    Inability: Not on file  . Transportation needs:    Medical: Not on file    Non-medical: Not on file  Tobacco Use  . Smoking status: Former Smoker    Packs/day: 1.00    Years: 30.00    Pack years: 30.00    Types: Cigarettes    Last attempt to quit: 2006    Years since quitting: 13.5  . Smokeless tobacco: Never Used  Substance and Sexual Activity  . Alcohol use: Not Currently    Comment: once a week-wine  . Drug use: Never  . Sexual activity: Not Currently  Lifestyle  . Physical activity:    Days per week: Not on file    Minutes per session: Not on file  . Stress: Not on file  Relationships  . Social connections:    Talks on phone: Not on file    Gets together: Not on file    Attends religious service: Not on file    Active member of club or organization: Not on file    Attends meetings of clubs or organizations: Not on file    Relationship status: Not on file  . Intimate partner violence:    Fear of current or ex partner: Not on file    Emotionally abused: Not on file    Physically abused: Not on file    Forced sexual activity: Not on file  Other Topics Concern  . Not on file  Social History Narrative   ** Merged History Encounter **      Stays active at home  Regular exercise: no  Drinks 2 cups of coffee a week, 1 mountain dew soda a day.        Family History  Problem Relation Age of Onset  . Hypertension Mother   . Thyroid disease Mother   .  Cholelithiasis Daughter   . Cholelithiasis Son   . Hypertension Maternal Grandmother   . Diabetes Maternal Grandmother   . Heart attack Neg Hx   . Hyperlipidemia Neg Hx          Current Outpatient Medications  Medication Sig Dispense Refill  . amiodarone (PACERONE) 200 MG tablet TAKE 1 TABLET(200 MG) BY MOUTH DAILY 30 tablet 4  . brimonidine (ALPHAGAN) 0.15 % ophthalmic solution Place 1 drop into the left eye 2 (two) times daily.  3  . carvedilol (COREG) 3.125 MG tablet Take 1 tablet (3.125 mg total)  by mouth 2 (two) times daily. 180 tablet 3  . DESCOVY 200-25 MG tablet Take 1 tablet by mouth every morning.  11  . dorzolamide-timolol (COSOPT) 22.3-6.8 MG/ML ophthalmic solution Place 1 drop into the left eye 2 (two) times daily.    Marland Kitchen latanoprost (XALATAN) 0.005 % ophthalmic solution Place 1 drop into the left eye at bedtime.     . Multiple Vitamins-Minerals (ONE DAILY MENS 50+ MULTIVIT) TABS Take 1 tablet by mouth daily.    . polyethylene glycol (MIRALAX / GLYCOLAX) packet Take 17 g by mouth daily as needed for mild constipation. 14 each 0  . TIVICAY 50 MG tablet Take 50 mg by mouth every morning.  11  . Vitamin D, Ergocalciferol, (DRISDOL) 50000 UNITS CAPS capsule Take 50,000 units by mouth once a week on Saturdays  2  . warfarin (COUMADIN) 5 MG tablet TAKE 1 TABLET(5 MG) BY MOUTH DAILY AT 6 PM 30 tablet 0  . aspirin EC 81 MG tablet Take 81 mg by mouth daily.     No current facility-administered medications for this visit.         Allergies  Allergen Reactions  . Codeine Other (See Comments)    Causes constipation  . Cozaar [Losartan Potassium] Other (See Comments)    Causes constipation    REVIEW OF SYSTEMS (negative unless checked):  Cardiac:  Chest pain or chest pressure?  Shortness of breath upon activity?  Shortness of breath when lying flat?  Irregular heart rhythm?  Vascular:  Pain in calf, thigh, or hip brought on by walking?  Pain in feet at night that wakes you up  from your sleep?  Blood clot in your veins?  Leg swelling?  Pulmonary:  Oxygen at home?  Productive cough?  Wheezing?  Neurologic:  Sudden weakness in arms or legs?  Sudden numbness in arms or legs?  Sudden onset of difficult speaking or slurred speech?  Temporary loss of vision in one eye?  Problems with dizziness?  Gastrointestinal:  Blood in stool?  Vomited blood?  Genitourinary:  Burning when urinating?  Blood in urine?  End stage renal disease-HD: T/R/S  Psychiatric:  Major depression  Hematologic:  Bleeding problems?  Problems with blood clotting?  Dermatologic:  Rashes or ulcers?  Constitutional:  Fever or chills?  Ear/Nose/Throat:  Change in hearing?  Nose bleeds?  Sore throat?  Musculoskeletal:  Back pain?  Joint pain?  Muscle pain?  Physical Examination      Vitals:   07/03/18 1157  BP: 123/72  Pulse: 63  Resp: 20  SpO2: 100%  Weight: 187 lb (84.8 kg)  Height: 6\' 2"  (1.88 m)   Body mass index is 24.01 kg/m.  General Alert, O x 3, WD, NAD  Pulmonary Sym exp, good B air movt, CTA B  Cardiac RRR, Nl S1, S2, no Murmurs, No rubs, No S3,S4  Vascular Vessel Right Left    Radial Palpable Palpable    Brachial Palpable Palpable    Ulnar Not palpable Not palpable   Musculo-  skeletal M/S 5/5 throughout , Extremities without ischemic changes, no thrill or bruit in either arm, palpable thrombosed grafts  Neurologic Pain and light touch intact in extremities, Motor exam as listed above   Medical Decision Making  [place] is a 77 y.o. male who presents with ESRD requiring hemodialysis.    A/P 77 year old male with end-stage renal disease on dialysis via catheter.  We will plan right upper arm AV fistula versus graft today in the  operating room.  Brandon C. Donzetta Matters, MD [facility] [place] Office: [phone] Pager: [phone]

## 2018-08-07 NOTE — Anesthesia Postprocedure Evaluation (Signed)
Anesthesia Post Note  Patient: [facility]  Procedure(s) Performed: Creation of right arm brachiocephalic Fistula (Right Arm Lower)     Patient location during evaluation: PACU Anesthesia Type: General Level of consciousness: awake Pain management: pain level controlled Vital Signs Assessment: post-procedure vital signs reviewed and stable Respiratory status: spontaneous breathing Cardiovascular status: stable Postop Assessment: no headache Anesthetic complications: no    Last Vitals:  Vitals:   08/07/18 0955 08/07/18 1010  BP: 116/67 118/78  Pulse: 71 66  Resp: 12 16  Temp:  (!) 36.2 C  SpO2: 100% 100%    Last Pain:  Vitals:   08/07/18 1010  TempSrc:   PainSc: 0-No pain                 Tyjah Hai

## 2018-08-07 NOTE — Anesthesia Preprocedure Evaluation (Addendum)
Anesthesia Evaluation  Patient identified by MRN, date of birth, ID band Patient awake    Reviewed: Allergy & Precautions, NPO status , Patient's Chart, lab work & pertinent test results  Airway Mallampati: II  TM Distance: >3 FB     Dental  (+) Teeth Intact, Dental Advisory Given   Pulmonary sleep apnea , pneumonia, COPD, former smoker,    breath sounds clear to auscultation       Cardiovascular hypertension, + CAD and +CHF  + Valvular Problems/Murmurs  Rhythm:Regular Rate:Normal     Neuro/Psych    GI/Hepatic GERD  ,(+) Hepatitis -  Endo/Other    Renal/GU Renal disease     Musculoskeletal   Abdominal   Peds  Hematology  (+) anemia ,   Anesthesia Other Findings   Reproductive/Obstetrics                            Anesthesia Physical Anesthesia Plan  ASA: III  Anesthesia Plan: MAC   Post-op Pain Management:    Induction: Intravenous  PONV Risk Score and Plan: Treatment may vary due to age or medical condition  Airway Management Planned: Simple Face Mask and Nasal Cannula  Additional Equipment:   Intra-op Plan:   Post-operative Plan:   Informed Consent: I have reviewed the patients History and Physical, chart, labs and discussed the procedure including the risks, benefits and alternatives for the proposed anesthesia with the patient or authorized representative who has indicated his/her understanding and acceptance.   Dental advisory given  Plan Discussed with: CRNA and Anesthesiologist  Anesthesia Plan Comments:        Anesthesia Quick Evaluation

## 2018-08-08 ENCOUNTER — Encounter (HOSPITAL_COMMUNITY): Payer: Self-pay | Admitting: Vascular Surgery

## 2018-08-08 ENCOUNTER — Telehealth: Payer: Self-pay | Admitting: Vascular Surgery

## 2018-08-08 DIAGNOSIS — N2581 Secondary hyperparathyroidism of renal origin: Secondary | ICD-10-CM | POA: Diagnosis not present

## 2018-08-08 DIAGNOSIS — N186 End stage renal disease: Secondary | ICD-10-CM | POA: Diagnosis not present

## 2018-08-08 LAB — PROTIME-INR: INR: 3.1 — AB (ref 0.9–1.1)

## 2018-08-08 NOTE — Telephone Encounter (Signed)
sch appt spk to pt 09/13/18 3pm Dialysis Duplex 4pm p/o PA

## 2018-08-09 ENCOUNTER — Other Ambulatory Visit: Payer: Self-pay

## 2018-08-09 ENCOUNTER — Ambulatory Visit (INDEPENDENT_AMBULATORY_CARE_PROVIDER_SITE_OTHER): Payer: Medicare HMO | Admitting: Pharmacist Clinician (PhC)/ Clinical Pharmacy Specialist

## 2018-08-09 ENCOUNTER — Telehealth: Payer: Self-pay | Admitting: Family

## 2018-08-09 DIAGNOSIS — Z5181 Encounter for therapeutic drug level monitoring: Secondary | ICD-10-CM

## 2018-08-09 DIAGNOSIS — I480 Paroxysmal atrial fibrillation: Secondary | ICD-10-CM

## 2018-08-09 DIAGNOSIS — N186 End stage renal disease: Secondary | ICD-10-CM

## 2018-08-09 DIAGNOSIS — C61 Malignant neoplasm of prostate: Secondary | ICD-10-CM | POA: Diagnosis not present

## 2018-08-09 DIAGNOSIS — Z7901 Long term (current) use of anticoagulants: Secondary | ICD-10-CM | POA: Diagnosis not present

## 2018-08-09 LAB — POCT INR: INR: 3.1 — AB (ref 2.0–3.0)

## 2018-08-09 NOTE — Addendum Note (Signed)
Addendum  created 08/09/18 1822 by Belinda Block, MD   Sign clinical note

## 2018-08-09 NOTE — Anesthesia Postprocedure Evaluation (Signed)
Anesthesia Post Note  Patient: [name]  Procedure(s) Performed: Creation of right arm brachiocephalic Fistula (Right Arm Lower)     Anesthesia Type: MAC    Last Vitals:  Vitals:   08/07/18 0955 08/07/18 1010  BP: 116/67 118/78  Pulse: 71 66  Resp: 12 16  Temp:  (!) 36.2 C  SpO2: 100% 100%    Last Pain:  Vitals:   08/07/18 1010  TempSrc:   PainSc: 0-No pain                 Sinda Leedom

## 2018-08-09 NOTE — Telephone Encounter (Signed)
INR 3.06 (0.90-1.10) Debra from [facility]. Larena Glassman notified at PCP office. Routing result note to office. Per Caro Hight is not managing his coumadin. Given [phone] at [facility]. Results given to [facility].

## 2018-08-10 DIAGNOSIS — N2581 Secondary hyperparathyroidism of renal origin: Secondary | ICD-10-CM | POA: Diagnosis not present

## 2018-08-10 DIAGNOSIS — N186 End stage renal disease: Secondary | ICD-10-CM | POA: Diagnosis not present

## 2018-08-12 ENCOUNTER — Ambulatory Visit (INDEPENDENT_AMBULATORY_CARE_PROVIDER_SITE_OTHER): Payer: Medicare HMO | Admitting: Family

## 2018-08-12 ENCOUNTER — Encounter: Payer: Self-pay | Admitting: Family

## 2018-08-12 VITALS — BP 128/62 | HR 62 | Temp 97.8°F | Resp 16 | Ht 74.0 in | Wt 195.0 lb

## 2018-08-12 DIAGNOSIS — Z23 Encounter for immunization: Secondary | ICD-10-CM

## 2018-08-12 DIAGNOSIS — I509 Heart failure, unspecified: Secondary | ICD-10-CM

## 2018-08-12 DIAGNOSIS — I4891 Unspecified atrial fibrillation: Secondary | ICD-10-CM

## 2018-08-12 DIAGNOSIS — Z21 Asymptomatic human immunodeficiency virus [HIV] infection status: Secondary | ICD-10-CM

## 2018-08-12 DIAGNOSIS — N186 End stage renal disease: Secondary | ICD-10-CM

## 2018-08-12 DIAGNOSIS — M109 Gout, unspecified: Secondary | ICD-10-CM

## 2018-08-12 MED ORDER — MONTELUKAST SODIUM 10 MG PO TABS: 10.0000 mg | ORAL_TABLET | Freq: Every day | ORAL | 5 refills | Status: DC

## 2018-08-12 NOTE — Patient Instructions (Addendum)
Please check with your kidney doctor if you should be taking Auryxia rx.   Begin singulair once daily for your nasal congestion/phlegm. Let me know if symptoms worsen or if they do not improve.

## 2018-08-12 NOTE — Progress Notes (Signed)
Subjective:    Patient ID: David [name], male    DOB: Mar 18, 1941, 77 y.o.   MRN: [id number]  HPI   David [name] is a 77 yr old male who presents today for follow up.  AF- maintained on coumadin which is being monitored at HD.   Gout- denies recent gout symptoms.  HIV-  Lab Results  Component Value Date   CD4TCELL 26 (L) 06/19/2018   CD4TABS 380 (L) 06/19/2018   ESRD- Tuesday/thursday/saturdays.  CHF- denies SOB or syncope or LE edema.   Wt Readings from Last 3 Encounters:  08/12/18 195 lb (88.5 kg)  08/07/18 193 lb (87.5 kg)  07/15/18 194 lb 6.4 oz (88.2 kg)   Notes some post-nasal drip/phlegm in the back of his throat.    Review of Systems    see HPI  Past Medical History:  Diagnosis Date  . Acute on chronic systolic and diastolic heart failure, NYHA class 4 ([place])   . Anemia, iron deficiency 11/15/2011  . Arthritis   . Arthritis    "hands, right knee, feet" (02/21/2018)  . Atrial fibrillation ([place])   . Cancer [facility])    hx of prostate; s/p radioactive seed implant 10/2009 Dr Janice Norrie  . Cardiac arrest ([place]) 02/17/2018  . CHF (congestive heart failure) ([place])   . Chronic kidney disease   . Chronic lower back pain   . CKD (chronic kidney disease) stage V requiring chronic dialysis ([place])   . ESRD on dialysis ([place])    started 02/2018  . GERD (gastroesophageal reflux disease)   . Glaucoma   . Gout    daily RX (02/21/2018)  . Heart murmur    "mild" per pt  . Hepatitis    years ago  . History of cardiac cath 2004   negative for CAD  . History of myocardial perfusion scan 02/2010   negative for coronary insufficiency (LVEF 27%)  . HIV (human immunodeficiency virus infection) ([place])   . HIV infection ([place])   . Hyperlipidemia   . Hypertension    followed by [facility] and Vascular (Dr Dani Gobble Croitoru)  . Hypertension   . Nonischemic cardiomyopathy ([place])   . Pneumonia 11/2017  . Prostate cancer ([place])   . Sinus bradycardia   . Sleep apnea    does not use  a cpap  . Stroke [facility])    "mini stroke" years ago  . Systolic and diastolic CHF, acute ([place]) 06/2010   felt to be secondary to hypertensive cardiomyopathy     Social History   Socioeconomic History  . Marital status: Married    Spouse name: Not on file  . Number of children: 2  . Years of education: 55  . Highest education level: Not on file  Occupational History  . Occupation: retired, picks up Aeronautical engineer: RETIRED  Social Needs  . Financial resource strain: Not on file  . Food insecurity:    Worry: Not on file    Inability: Not on file  . Transportation needs:    Medical: Not on file    Non-medical: Not on file  Tobacco Use  . Smoking status: Former Smoker    Packs/day: 1.00    Years: 30.00    Pack years: 30.00    Types: Cigarettes    Last attempt to quit: 2006    Years since quitting: 13.6  . Smokeless tobacco: Never Used  Substance and Sexual Activity  . Alcohol use: Yes    Comment: once a  week-wine  . Drug use: Never  . Sexual activity: Not Currently  Lifestyle  . Physical activity:    Days per week: Not on file    Minutes per session: Not on file  . Stress: Not on file  Relationships  . Social connections:    Talks on phone: Not on file    Gets together: Not on file    Attends religious service: Not on file    Active member of club or organization: Not on file    Attends meetings of clubs or organizations: Not on file    Relationship status: Not on file  . Intimate partner violence:    Fear of current or ex partner: Not on file    Emotionally abused: Not on file    Physically abused: Not on file    Forced sexual activity: Not on file  Other Topics Concern  . Not on file  Social History Narrative   ** Merged History Encounter **       Stays active at home Regular exercise: no Drinks 2 cups of coffee a week, 1 mountain dew soda a day.    Past Surgical History:  Procedure Laterality Date  . AV FISTULA PLACEMENT Right 10/13/2016    Procedure: ARTERIOVENOUS (AV) FISTULA CREATION;  Surgeon: [name], MD;  Location: [place];  Service: Vascular;  Laterality: Right;  . AV FISTULA PLACEMENT Right 10/13/2016   [name] [id number]  . AV FISTULA PLACEMENT Left 02/25/2018   Procedure: INSERTION OF ARTERIOVENOUS (AV) GORE-TEX GRAFT LEFT UPPER ARM;  Surgeon: Angelia Mould, MD;  Location: [place];  Service: Vascular;  Laterality: Left;  . AV FISTULA PLACEMENT Right 08/07/2018   Procedure: Creation of right arm brachiocephalic Fistula;  Surgeon: Waynetta Sandy, MD;  Location: [place];  Service: Vascular;  Laterality: Right;  . BASCILIC VEIN TRANSPOSITION Left 06/24/2015   Procedure: BASILIC VEIN TRANSPOSITION;  Surgeon: Mal Misty, MD;  Location: [place];  Service: Vascular;  Laterality: Left;  . BASCILIC VEIN TRANSPOSITION Left 7/90/2409 Bascilic vein transposition (Left)   [name] [id number]  . CARDIAC CATHETERIZATION  02/04/2003   mildly depressed LV systolic fx EF 26%,STMHDQ coronaries/abdominal aorta/renal arteries.  Marland Kitchen CARDIAC CATHETERIZATION  2004  . CATARACT EXTRACTION, BILATERAL Bilateral   . COLONOSCOPY    . ESOPHAGOGASTRODUODENOSCOPY (EGD) WITH PROPOFOL N/A 05/05/2014   Procedure: ESOPHAGOGASTRODUODENOSCOPY (EGD) WITH PROPOFOL;  Surgeon: Missy Sabins, MD;  Location: [facility];  Service: Endoscopy;  Laterality: N/A;  . EYE SURGERY Bilateral    cataract surgery   . EYE SURGERY Bilateral    glaucoma surgery  . EYE SURGERY    . GLAUCOMA SURGERY Bilateral   . INSERTION PROSTATE RADIATION SEED    . IR FLUORO GUIDE CV LINE RIGHT  02/18/2018  . IR US GUIDE VASC ACCESS RIGHT  02/18/2018  . LEFT HEART CATH AND CORONARY ANGIOGRAPHY N/A 02/20/2018   Procedure: LEFT HEART CATH AND CORONARY ANGIOGRAPHY;  Surgeon: Jettie Booze, MD;  Location: [place] CV LAB;  Service: Cardiovascular;  Laterality: N/A;  . NM MYOCAR PERF WALL MOTION  02/21/2010   normal  . RADIOACTIVE SEED IMPLANT  2010   prostate cancer  .  THROMBECTOMY W/ EMBOLECTOMY Left 02/26/2018   Procedure: THROMBECTOMY ARTERIOVENOUS GRAFT;  Surgeon: Angelia Mould, MD;  Location: [place];  Service: Vascular;  Laterality: Left;  . ULTRASOUND GUIDANCE FOR VASCULAR ACCESS  02/20/2018   Procedure: Ultrasound Guidance For Vascular Access;  Surgeon: Jettie Booze, MD;  Location:  [place] INVASIVE CV LAB;  Service: Cardiovascular;;  . UPPER EXTREMITY VENOGRAPHY N/A 07/08/2018   Procedure: UPPER EXTREMITY VENOGRAPHY;  Surgeon: Waynetta Sandy, MD;  Location: [place] CV LAB;  Service: Cardiovascular;  Laterality: N/A;  Bilateral  . US ECHOCARDIOGRAPHY  02/06/2012   mild LVH,LA mod. dilated,mild-mod. 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 AI.    Family History  Problem Relation Age of Onset  . Hypertension Mother   . Thyroid disease Mother   . Cholelithiasis Daughter   . Cholelithiasis Son   . Hypertension Maternal Grandmother   . Diabetes Maternal Grandmother   . Heart attack Neg Hx   . Hyperlipidemia Neg Hx     Allergies  Allergen Reactions  . Codeine Other (See Comments)    Causes constipation  . Cozaar [Losartan Potassium] Other (See Comments)    Causes constipation     Current Outpatient Medications on File Prior to Visit  Medication Sig Dispense Refill  . allopurinol (ZYLOPRIM) 100 MG tablet Take 100 mg by mouth every evening.     Marland Kitchen amiodarone (PACERONE) 200 MG tablet TAKE 1 TABLET(200 MG) BY MOUTH DAILY (Patient taking differently: Take 200 mg by mouth daily. ) 30 tablet 4  . brimonidine (ALPHAGAN) 0.15 % ophthalmic solution Place 1 drop into the left eye 2 (two) times daily.  3  . carvedilol (COREG) 3.125 MG tablet Take 1 tablet (3.125 mg total) by mouth 2 (two) times daily. 180 tablet 3  . DESCOVY 200-25 MG tablet Take 1 tablet by mouth every morning.  11  . dorzolamide-timolol (COSOPT) 22.3-6.8 MG/ML ophthalmic solution Place 1 drop into the left eye 2 (two) times daily.    Marland Kitchen  HYDROcodone-acetaminophen (NORCO/VICODIN) 5-325 MG tablet Take 1 tablet by mouth every 6 (six) hours as needed for moderate pain. Take 1 tablet by mouth in the evening on Tuesday, Thursday and Saturday. Take 1 tablet by mouth twice daily on all other days. 6 tablet 0  . latanoprost (XALATAN) 0.005 % ophthalmic solution Place 1 drop into the left eye at bedtime.     . midodrine (PROAMATINE) 10 MG tablet Take 10 mg by mouth 2 (two) times daily.    . multivitamin (RENA-VIT) TABS tablet Take 1 tablet by mouth daily.    . polyethylene glycol (MIRALAX / GLYCOLAX) packet Take 17 g by mouth daily as needed for mild constipation. 14 each 0  . TIVICAY 50 MG tablet Take 50 mg by mouth every morning.  11  . Vitamin D, Ergocalciferol, (DRISDOL) 50000 UNITS CAPS capsule Take 50,000 Units by mouth every Friday.   2  . warfarin (COUMADIN) 5 MG tablet Take 1 to 1 and 1/2 tablets daily as directed by coumadin clinic (Patient taking differently: Take 5 mg by mouth every evening. ) 45 tablet 0   No current facility-administered medications on file prior to visit.     BP 128/62 (BP Location: Left Arm, Cuff Size: Large)   Pulse 62   Temp 97.8 F (36.6 C) (Oral)   Resp 16   Ht 6\' 2"  (1.88 m)   Wt 195 lb (88.5 kg)   SpO2 100%   BMI 25.04 kg/m    Objective:   Physical Exam  Constitutional: He is oriented to person, place, and time. He appears well-developed and well-nourished. No distress.  HENT:  Head: Normocephalic and atraumatic.  Cardiovascular: Normal rate and regular rhythm.  No murmur heard. Pulmonary/Chest: Effort normal and breath sounds normal. No respiratory distress. He  has no wheezes. He has no rales.  Musculoskeletal: He exhibits no edema.  Neurological: He is alert and oriented to person, place, and time.  Skin: Skin is warm and dry.  Psychiatric: He has a normal mood and affect. His behavior is normal. Thought content normal.          Assessment & Plan:  ESRD- continues HD.   Management per nephrology.   HIV infection-  Stable, management per ID.   CHF- appears euvolemic since starting HD.  Management per cardiology.   Gout- stable on allopurinol.   AF- rate stable on amiodarone. Maintained on coumadin which is being dosed by nephrology at his HD.    Flu shot today.

## 2018-08-13 DIAGNOSIS — N2581 Secondary hyperparathyroidism of renal origin: Secondary | ICD-10-CM | POA: Diagnosis not present

## 2018-08-13 DIAGNOSIS — N186 End stage renal disease: Secondary | ICD-10-CM | POA: Diagnosis not present

## 2018-08-15 DIAGNOSIS — N2581 Secondary hyperparathyroidism of renal origin: Secondary | ICD-10-CM | POA: Diagnosis not present

## 2018-08-15 DIAGNOSIS — N186 End stage renal disease: Secondary | ICD-10-CM | POA: Diagnosis not present

## 2018-08-15 LAB — PROTIME-INR

## 2018-08-16 DIAGNOSIS — N5201 Erectile dysfunction due to arterial insufficiency: Secondary | ICD-10-CM | POA: Diagnosis not present

## 2018-08-16 DIAGNOSIS — C61 Malignant neoplasm of prostate: Secondary | ICD-10-CM | POA: Diagnosis not present

## 2018-08-17 DIAGNOSIS — N186 End stage renal disease: Secondary | ICD-10-CM | POA: Diagnosis not present

## 2018-08-17 DIAGNOSIS — N2581 Secondary hyperparathyroidism of renal origin: Secondary | ICD-10-CM | POA: Diagnosis not present

## 2018-08-20 DIAGNOSIS — N186 End stage renal disease: Secondary | ICD-10-CM | POA: Diagnosis not present

## 2018-08-20 DIAGNOSIS — N2581 Secondary hyperparathyroidism of renal origin: Secondary | ICD-10-CM | POA: Diagnosis not present

## 2018-08-22 DIAGNOSIS — N186 End stage renal disease: Secondary | ICD-10-CM | POA: Diagnosis not present

## 2018-08-22 DIAGNOSIS — N2581 Secondary hyperparathyroidism of renal origin: Secondary | ICD-10-CM | POA: Diagnosis not present

## 2018-08-24 DIAGNOSIS — N2581 Secondary hyperparathyroidism of renal origin: Secondary | ICD-10-CM | POA: Diagnosis not present

## 2018-08-24 DIAGNOSIS — N186 End stage renal disease: Secondary | ICD-10-CM | POA: Diagnosis not present

## 2018-08-26 ENCOUNTER — Other Ambulatory Visit: Payer: Self-pay | Admitting: Cardiovascular Disease

## 2018-08-27 DIAGNOSIS — N2581 Secondary hyperparathyroidism of renal origin: Secondary | ICD-10-CM | POA: Diagnosis not present

## 2018-08-27 DIAGNOSIS — N186 End stage renal disease: Secondary | ICD-10-CM | POA: Diagnosis not present

## 2018-08-28 ENCOUNTER — Other Ambulatory Visit: Payer: Self-pay | Admitting: Family Medicine

## 2018-08-28 ENCOUNTER — Ambulatory Visit (INDEPENDENT_AMBULATORY_CARE_PROVIDER_SITE_OTHER): Payer: Medicare HMO | Admitting: Family Medicine

## 2018-08-28 ENCOUNTER — Encounter: Payer: Self-pay | Admitting: Family Medicine

## 2018-08-28 ENCOUNTER — Ambulatory Visit: Payer: Medicare HMO | Admitting: Family Medicine

## 2018-08-28 VITALS — BP 144/82 | HR 79 | Temp 97.9°F | Resp 16 | Wt 191.0 lb

## 2018-08-28 DIAGNOSIS — T50905A Adverse effect of unspecified drugs, medicaments and biological substances, initial encounter: Secondary | ICD-10-CM

## 2018-08-28 DIAGNOSIS — R1110 Vomiting, unspecified: Secondary | ICD-10-CM | POA: Diagnosis not present

## 2018-08-28 DIAGNOSIS — K219 Gastro-esophageal reflux disease without esophagitis: Secondary | ICD-10-CM | POA: Insufficient documentation

## 2018-08-28 MED ORDER — OMEPRAZOLE 40 MG PO CPDR: 40.0000 mg | DELAYED_RELEASE_CAPSULE | Freq: Every day | ORAL | 1 refills | Status: DC

## 2018-08-28 MED ORDER — ONDANSETRON HCL 4 MG PO TABS: ORAL_TABLET | ORAL | 0 refills | Status: DC

## 2018-08-28 NOTE — Patient Instructions (Signed)
Take the Zofran (nausea medication) before meals. Try to stay hydrated.  Call your dentist to alert him of what has happened and find out what recs he has.  The only lifestyle changes that have data behind them are weight loss for the overweight/obese and elevating the head of the bed. Finding out which foods/positions are triggers is important.  Let us know if you need anything.

## 2018-08-28 NOTE — Progress Notes (Signed)
Chief Complaint  Patient presents with  . Emesis    started Amoxicillin and Tramadol yesterday for dental procedure, vomitting started after first dose of antibiotic.      Subjective [facility] Bozzi is a 77 y.o. male who presents with vomiting. Symptoms began 1 d ago.  Patient has vomiting Patient denies abdominal pain, cramping, diarrhea, fever and bleeding Started on amoxicillin and tramadol yesterday per dental team and started having issues 1 hr after initial doses of each.  Evaluation to date: no Sick contacts: none known   Pt reports having reflux (he thinks) as he will have a bitter taste in AM when he wakes up. Would like to try medicine. No pain.   Past Medical History:  Diagnosis Date  . Acute on chronic systolic and diastolic heart failure, NYHA class 4 ([place])   . Anemia, iron deficiency 11/15/2011  . Arthritis   . Arthritis    "hands, right knee, feet" (02/21/2018)  . Atrial fibrillation ([place])   . Cancer [facility])    hx of prostate; s/p radioactive seed implant 10/2009 Dr Janice Norrie  . Cardiac arrest ([place]) 02/17/2018  . CHF (congestive heart failure) ([place])   . Chronic kidney disease   . Chronic lower back pain   . CKD (chronic kidney disease) stage V requiring chronic dialysis ([place])   . ESRD on dialysis ([place])    started 02/2018  . GERD (gastroesophageal reflux disease)   . Glaucoma   . Gout    daily RX (02/21/2018)  . Heart murmur    "mild" per pt  . Hepatitis    years ago  . History of cardiac cath 2004   negative for CAD  . History of myocardial perfusion scan 02/2010   negative for coronary insufficiency (LVEF 27%)  . HIV (human immunodeficiency virus infection) ([place])   . HIV infection ([place])   . Hyperlipidemia   . Hypertension    followed by [facility] and Vascular (Dr Dani Gobble Croitoru)  . Hypertension   . Nonischemic cardiomyopathy ([place])   . Pneumonia 11/2017  . Prostate cancer ([place])   . Sinus bradycardia   . Sleep apnea    does not use a cpap  .  Stroke [facility])    "mini stroke" years ago  . Systolic and diastolic CHF, acute ([place]) 06/2010   felt to be secondary to hypertensive cardiomyopathy   Review of Systems Constitutional:  No fevers or chills Ear/Nose/Mouth/Throat:  No red eyes Gastrointestinal:  As noted in the HPI  Exam BP (!) 144/82   Pulse 79   Temp 97.9 F (36.6 C) (Oral)   Resp 16   Wt 191 lb (86.6 kg)   SpO2 96%   BMI 24.52 kg/m  General:  well developed, well hydrated, in no apparent distress Skin:  warm, no pallor or diaphoresis, no rashes Throat/Pharynx:  lips and gingiva without lesion; tongue and uvula midline; non-inflamed pharynx; no exudates or postnasal drainage Neck: neck supple without adenopathy, thyromegaly, or masses Lungs:  clear to auscultation, breath sounds equal bilaterally, no respiratory distress, no wheezes Cardio:  regular rate and rhythm with 2+ murmurs Abdomen:  abdomen soft, nontender; bowel sounds normal; no masses or organomegaly Extremities:  no clubbing, cyanosis, or edema Psych: Appropriate judgement/insight  Assessment and Plan  Adverse effect of drug, initial encounter - Plan: ondansetron (ZOFRAN) 4 MG tablet  Gastroesophageal reflux disease, esophagitis presence not specified - Plan: omeprazole (PRILOSEC) 40 MG capsule  Orders as above. Take Zofran 10-15 min prior to meals.  Try to push fluids.  Stop medications, discuss next steps with dentist. Start PPI. F/u in 6 weeks with reg PCP to determine if this is something that is helpful or if trial to wean is appropriate after 2 mo.  F/u prn otherwise. The patient voiced understanding and agreement to the plan.  [place], DO 08/28/18  11:55 AM

## 2018-08-29 DIAGNOSIS — N2581 Secondary hyperparathyroidism of renal origin: Secondary | ICD-10-CM | POA: Diagnosis not present

## 2018-08-29 DIAGNOSIS — N186 End stage renal disease: Secondary | ICD-10-CM | POA: Diagnosis not present

## 2018-08-29 LAB — PROTIME-INR: INR: 1.5 — AB (ref 0.9–1.1)

## 2018-08-31 DIAGNOSIS — N2581 Secondary hyperparathyroidism of renal origin: Secondary | ICD-10-CM | POA: Diagnosis not present

## 2018-08-31 DIAGNOSIS — N186 End stage renal disease: Secondary | ICD-10-CM | POA: Diagnosis not present

## 2018-09-02 ENCOUNTER — Ambulatory Visit (INDEPENDENT_AMBULATORY_CARE_PROVIDER_SITE_OTHER): Payer: Medicare HMO | Admitting: Pharmacist Clinician (PhC)/ Clinical Pharmacy Specialist

## 2018-09-02 DIAGNOSIS — Z992 Dependence on renal dialysis: Secondary | ICD-10-CM | POA: Diagnosis not present

## 2018-09-02 DIAGNOSIS — I480 Paroxysmal atrial fibrillation: Secondary | ICD-10-CM

## 2018-09-02 DIAGNOSIS — B20 Human immunodeficiency virus [HIV] disease: Secondary | ICD-10-CM | POA: Diagnosis not present

## 2018-09-02 DIAGNOSIS — Z5181 Encounter for therapeutic drug level monitoring: Secondary | ICD-10-CM

## 2018-09-02 DIAGNOSIS — N186 End stage renal disease: Secondary | ICD-10-CM | POA: Diagnosis not present

## 2018-09-03 DIAGNOSIS — N186 End stage renal disease: Secondary | ICD-10-CM | POA: Diagnosis not present

## 2018-09-03 DIAGNOSIS — N2581 Secondary hyperparathyroidism of renal origin: Secondary | ICD-10-CM | POA: Diagnosis not present

## 2018-09-05 DIAGNOSIS — N2581 Secondary hyperparathyroidism of renal origin: Secondary | ICD-10-CM | POA: Diagnosis not present

## 2018-09-05 DIAGNOSIS — N186 End stage renal disease: Secondary | ICD-10-CM | POA: Diagnosis not present

## 2018-09-07 DIAGNOSIS — N2581 Secondary hyperparathyroidism of renal origin: Secondary | ICD-10-CM | POA: Diagnosis not present

## 2018-09-07 DIAGNOSIS — N186 End stage renal disease: Secondary | ICD-10-CM | POA: Diagnosis not present

## 2018-09-10 DIAGNOSIS — N186 End stage renal disease: Secondary | ICD-10-CM | POA: Diagnosis not present

## 2018-09-10 DIAGNOSIS — N2581 Secondary hyperparathyroidism of renal origin: Secondary | ICD-10-CM | POA: Diagnosis not present

## 2018-09-12 DIAGNOSIS — N2581 Secondary hyperparathyroidism of renal origin: Secondary | ICD-10-CM | POA: Diagnosis not present

## 2018-09-12 DIAGNOSIS — N186 End stage renal disease: Secondary | ICD-10-CM | POA: Diagnosis not present

## 2018-09-12 LAB — PROTIME-INR: INR: 1.7 — AB (ref 0.9–1.1)

## 2018-09-13 ENCOUNTER — Ambulatory Visit (INDEPENDENT_AMBULATORY_CARE_PROVIDER_SITE_OTHER): Payer: Self-pay | Admitting: Physician Assistant

## 2018-09-13 ENCOUNTER — Other Ambulatory Visit: Payer: Self-pay | Admitting: Infectious Disease

## 2018-09-13 ENCOUNTER — Other Ambulatory Visit: Payer: Self-pay | Admitting: *Deleted

## 2018-09-13 ENCOUNTER — Ambulatory Visit (HOSPITAL_COMMUNITY)
Admission: RE | Admit: 2018-09-13 | Discharge: 2018-09-13 | Disposition: A | Discharge status: Home / Self Care | Payer: Medicare HMO | Source: Ambulatory Visit | Attending: Family | Admitting: Family

## 2018-09-13 ENCOUNTER — Encounter: Payer: Self-pay | Admitting: *Deleted

## 2018-09-13 ENCOUNTER — Other Ambulatory Visit: Payer: Self-pay

## 2018-09-13 ENCOUNTER — Telehealth: Payer: Self-pay | Admitting: *Deleted

## 2018-09-13 VITALS — BP 138/76 | HR 66 | Temp 98.7°F | Ht 74.0 in | Wt 193.0 lb

## 2018-09-13 DIAGNOSIS — N186 End stage renal disease: Secondary | ICD-10-CM

## 2018-09-13 DIAGNOSIS — B20 Human immunodeficiency virus [HIV] disease: Secondary | ICD-10-CM

## 2018-09-13 NOTE — Telephone Encounter (Signed)
Request for Surgical Clearance  1. What type of surgery is being performed?  INSERTION OF A/V GRAFT    2. When is this surgery scheduled? 10/09/2018  3. What type of clearance is required (medical clearance vs. Pharmacy clearance to hold med vs. Both)? PHARMACY CLEARANCE TO HOLD COUMADIN     4. Are there any medications that need to be held prior to surgery and how long?  COUMADIN HOLD 5 DAYS PRE-OP.    5. Practice name and name of physician performing surgery?   DR. Donzetta Matters     6.  What is your office phone number? [phone]    7. What is your office fax number? (Be sure to include anyone who it needs to go Attn to) [place]    8. Anesthesia type (None, local, MAC, general)? MAC   REMINDER TO USER: Remember to please route this message to P CV DIV PREOP in a phone note.

## 2018-09-13 NOTE — Progress Notes (Signed)
POST OPERATIVE OFFICE NOTE    CC:  F/u for surgery  HPI:  This is a 77 y.o. male who is s/p right first stage basilic vein transposition on 08/07/18 by Dr. Donzetta Matters who presents today for follow up with duplex.  He denies any pain in his hand.  He dialyzes on T/T/S at the [place] location.    He currently dialyzes via a right TDC that was placed March 2019 by interventional radiology.   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 Kellie Simmering) -right 1st stage BVT November 2017 (Dr. Donnetta Hutching) -LUA AVG March 2019 (Dr. Scot Dock) -thrombectomy of LUA AVG March 2019 (Dr. Scot Dock) -BUE venography August 2019 (Dr. Donzetta Matters) -right 1st stage BVT September 2019 (Dr. Donzetta Matters)    Allergies  Allergen Reactions  . Codeine Other (See Comments)    Causes constipation  . Cozaar [Losartan Potassium] Other (See Comments)    Causes constipation     Current Outpatient Medications  Medication Sig Dispense Refill  . allopurinol (ZYLOPRIM) 100 MG tablet Take 100 mg by mouth every evening.     Marland Kitchen amiodarone (PACERONE) 200 MG tablet TAKE 1 TABLET(200 MG) BY MOUTH DAILY (Patient taking differently: Take 200 mg by mouth daily. ) 30 tablet 4  . brimonidine (ALPHAGAN) 0.15 % ophthalmic solution Place 1 drop into the left eye 2 (two) times daily.  3  . carvedilol (COREG) 3.125 MG tablet Take 1 tablet (3.125 mg total) by mouth 2 (two) times daily. 180 tablet 3  . DESCOVY 200-25 MG tablet Take 1 tablet by mouth every morning.  11  . dorzolamide-timolol (COSOPT) 22.3-6.8 MG/ML ophthalmic solution Place 1 drop into the left eye 2 (two) times daily.    Marland Kitchen HYDROcodone-acetaminophen (NORCO/VICODIN) 5-325 MG tablet Take 1 tablet by mouth every 6 (six) hours as needed for moderate pain. Take 1 tablet by mouth in the evening on Tuesday, Thursday and Saturday. Take 1 tablet by mouth twice daily on all other days. 6 tablet 0  . latanoprost (XALATAN) 0.005 % ophthalmic solution Place 1 drop into the left eye at bedtime.     . midodrine  (PROAMATINE) 10 MG tablet Take 10 mg by mouth 2 (two) times daily.    . montelukast (SINGULAIR) 10 MG tablet Take 1 tablet (10 mg total) by mouth at bedtime. 30 tablet 5  . multivitamin (RENA-VIT) TABS tablet Take 1 tablet by mouth daily.    Marland Kitchen omeprazole (PRILOSEC) 40 MG capsule Take 1 capsule (40 mg total) by mouth daily. 90 capsule 0  . ondansetron (ZOFRAN) 4 MG tablet Take 10-15 minutes before meals. 20 tablet 0  . polyethylene glycol (MIRALAX / GLYCOLAX) packet Take 17 g by mouth daily as needed for mild constipation. 14 each 0  . TIVICAY 50 MG tablet Take 50 mg by mouth every morning.  11  . Vitamin D, Ergocalciferol, (DRISDOL) 50000 UNITS CAPS capsule Take 50,000 Units by mouth every Friday.   2  . warfarin (COUMADIN) 5 MG tablet TAKE 1 TO 1 AND 1/2 TABLETS BY MOUTH DAILY AS DIRECTED BY COUMADIN CLINIC 45 tablet 0   No current facility-administered medications for this visit.      ROS:  See HPI  Physical Exam: Vitals:   09/13/18 1454  BP: 138/76  Pulse: 66  Temp: 98.7 F (37.1 C)  SpO2: 100%   Vitals:   09/13/18 1454  Weight: 193 lb (87.5 kg)  Height: 6\' 2"  (1.88 m)   Body mass index is 24.78 kg/m.    Incision:  Well healed Extremities:  +palpable right radial pulse; -thrill/bruit within fistula; motor/sensation are in tact.  Dialysis duplex 09/13/18: Fistula is occluded.    Assessment/Plan:  This is a 77 y.o. male who is s/p: Right first stage basilic vein AV fistula creation by Dr. Donzetta Matters on 08/07/18.  -his fistula is occluded.   -He has had multiple accesses in the past that have also thrombosed (see HPI).  I discussed pt with Dr. Donzetta Matters and will plan on a right arm graft at the pt's earliest convenience.  He would like Dr. Donzetta Matters to do the surgery and will plan for a non-dialysis day. -I discussed the sx of steal syndrome and possibility of graft infection -he expressed understanding that these are low risk but possible.  -he is on coumadin for afib-he does not think  he has had to bridge with lovenox in the past.   Leontine Locket, PA-C [facility] [phone]  Clinic MD:  None

## 2018-09-13 NOTE — Telephone Encounter (Signed)
Pharm please address coumadin 

## 2018-09-14 DIAGNOSIS — N186 End stage renal disease: Secondary | ICD-10-CM | POA: Diagnosis not present

## 2018-09-14 DIAGNOSIS — N2581 Secondary hyperparathyroidism of renal origin: Secondary | ICD-10-CM | POA: Diagnosis not present

## 2018-09-16 NOTE — Telephone Encounter (Signed)
Patient with diagnosis of Afib on warfarin for anticoagulation.    Procedure: insertion of A/V graft Date of procedure: 10/09/18  CHADS2-VASc score of  7 (CHF, HTN, AGE, DM2, stroke/tia x 2, CAD, AGE, male)  Pt on dialysis with history of stroke would meet criteria for bridging around procedure. Will route to [name] for ok to bridge with Lovenox given ESRD on HD.

## 2018-09-16 NOTE — Telephone Encounter (Signed)
See Dr. Lurline Del note for ok to hold for 4-5 days prior to procedure and resume ASAP without lovenox bridging.

## 2018-09-16 NOTE — Telephone Encounter (Signed)
Although the embolic risk is high, I think the risk of bleeding is also very high and enoxaparin less predictable with ESRD. I would recommend just stopping warfarin 4-5 days before procedure and restarting ASAP postop. [name], MD, [facility] [phone] office [phone] pager

## 2018-09-17 DIAGNOSIS — N186 End stage renal disease: Secondary | ICD-10-CM | POA: Diagnosis not present

## 2018-09-17 DIAGNOSIS — N2581 Secondary hyperparathyroidism of renal origin: Secondary | ICD-10-CM | POA: Diagnosis not present

## 2018-09-18 DIAGNOSIS — H401133 Primary open-angle glaucoma, bilateral, severe stage: Secondary | ICD-10-CM | POA: Diagnosis not present

## 2018-09-18 DIAGNOSIS — Z961 Presence of intraocular lens: Secondary | ICD-10-CM | POA: Diagnosis not present

## 2018-09-19 ENCOUNTER — Ambulatory Visit (INDEPENDENT_AMBULATORY_CARE_PROVIDER_SITE_OTHER): Payer: Medicare HMO | Admitting: Pharmacist

## 2018-09-19 DIAGNOSIS — N186 End stage renal disease: Secondary | ICD-10-CM | POA: Diagnosis not present

## 2018-09-19 DIAGNOSIS — I480 Paroxysmal atrial fibrillation: Secondary | ICD-10-CM

## 2018-09-19 DIAGNOSIS — Z5181 Encounter for therapeutic drug level monitoring: Secondary | ICD-10-CM | POA: Diagnosis not present

## 2018-09-19 DIAGNOSIS — N2581 Secondary hyperparathyroidism of renal origin: Secondary | ICD-10-CM | POA: Diagnosis not present

## 2018-09-19 NOTE — Telephone Encounter (Signed)
   I will route this recommendation to the requesting party via Epic fax function and remove from pre-op pool.  Please call with questions.  [place], [place] 09/19/2018, 2:22 PM

## 2018-09-21 DIAGNOSIS — N186 End stage renal disease: Secondary | ICD-10-CM | POA: Diagnosis not present

## 2018-09-21 DIAGNOSIS — N2581 Secondary hyperparathyroidism of renal origin: Secondary | ICD-10-CM | POA: Diagnosis not present

## 2018-09-24 DIAGNOSIS — N186 End stage renal disease: Secondary | ICD-10-CM | POA: Diagnosis not present

## 2018-09-24 DIAGNOSIS — N2581 Secondary hyperparathyroidism of renal origin: Secondary | ICD-10-CM | POA: Diagnosis not present

## 2018-09-24 DIAGNOSIS — R6883 Chills (without fever): Secondary | ICD-10-CM | POA: Insufficient documentation

## 2018-09-26 ENCOUNTER — Telehealth: Payer: Self-pay

## 2018-09-26 DIAGNOSIS — N2581 Secondary hyperparathyroidism of renal origin: Secondary | ICD-10-CM | POA: Diagnosis not present

## 2018-09-26 DIAGNOSIS — N186 End stage renal disease: Secondary | ICD-10-CM | POA: Diagnosis not present

## 2018-09-26 NOTE — Telephone Encounter (Signed)
Chief Strategy Officer printed and faxed immunization history to [name] as requested [phone].  Copied from [place] [phone]. Topic: General - Inquiry >> Sep 25, 2018  1:17 PM [place] Nail, [place] wrote: Reason for CRM: Samantha with [facility] calling and states that they are needing proof that the patient had his flu shot for this year. States that he signed their  declination form. Please advise.  Fax#: [phone] CB#: [phone]

## 2018-09-28 DIAGNOSIS — N2581 Secondary hyperparathyroidism of renal origin: Secondary | ICD-10-CM | POA: Diagnosis not present

## 2018-09-28 DIAGNOSIS — N186 End stage renal disease: Secondary | ICD-10-CM | POA: Diagnosis not present

## 2018-10-01 DIAGNOSIS — N186 End stage renal disease: Secondary | ICD-10-CM | POA: Diagnosis not present

## 2018-10-01 DIAGNOSIS — N2581 Secondary hyperparathyroidism of renal origin: Secondary | ICD-10-CM | POA: Diagnosis not present

## 2018-10-03 DIAGNOSIS — Z992 Dependence on renal dialysis: Secondary | ICD-10-CM | POA: Diagnosis not present

## 2018-10-03 DIAGNOSIS — N186 End stage renal disease: Secondary | ICD-10-CM | POA: Diagnosis not present

## 2018-10-03 DIAGNOSIS — B20 Human immunodeficiency virus [HIV] disease: Secondary | ICD-10-CM | POA: Diagnosis not present

## 2018-10-03 DIAGNOSIS — N2581 Secondary hyperparathyroidism of renal origin: Secondary | ICD-10-CM | POA: Diagnosis not present

## 2018-10-04 LAB — PROTIME-INR: INR: 2.2 — AB (ref 0.9–1.1)

## 2018-10-05 DIAGNOSIS — N2581 Secondary hyperparathyroidism of renal origin: Secondary | ICD-10-CM | POA: Diagnosis not present

## 2018-10-05 DIAGNOSIS — N186 End stage renal disease: Secondary | ICD-10-CM | POA: Diagnosis not present

## 2018-10-07 ENCOUNTER — Encounter (HOSPITAL_COMMUNITY): Payer: Self-pay | Admitting: *Deleted

## 2018-10-07 ENCOUNTER — Ambulatory Visit (INDEPENDENT_AMBULATORY_CARE_PROVIDER_SITE_OTHER): Payer: Medicare HMO | Admitting: Pharmacist

## 2018-10-07 ENCOUNTER — Other Ambulatory Visit: Payer: Self-pay

## 2018-10-07 DIAGNOSIS — I480 Paroxysmal atrial fibrillation: Secondary | ICD-10-CM

## 2018-10-07 DIAGNOSIS — Z5181 Encounter for therapeutic drug level monitoring: Secondary | ICD-10-CM

## 2018-10-07 NOTE — Progress Notes (Signed)
Pt denies SOB and chest pain. Pt under the care of [name], Cardiology. Pt made aware to stop taking vitamins, fish oil and herbal medications. Do not take any NSAIDs ie: Ibuprofen, Advil, Naproxen (Aleve), Motrin, BC and Goody Powder. Pt stated that last dose of Coumadin was Thursday as instructed. Pt verbalized understaning of all pre-op instructions.

## 2018-10-08 DIAGNOSIS — N2581 Secondary hyperparathyroidism of renal origin: Secondary | ICD-10-CM | POA: Diagnosis not present

## 2018-10-08 DIAGNOSIS — N186 End stage renal disease: Secondary | ICD-10-CM | POA: Diagnosis not present

## 2018-10-08 NOTE — Anesthesia Preprocedure Evaluation (Addendum)
Anesthesia Evaluation  Patient identified by MRN, date of birth, ID band Patient awake    Reviewed: Allergy & Precautions, H&P , NPO status , Patient's Chart, lab work & pertinent test results, reviewed documented beta blocker date and time   Airway Mallampati: II  TM Distance: >3 FB Neck ROM: Full    Dental no notable dental hx. (+) Partial Lower, Partial Upper, Dental Advisory Given   Pulmonary neg pulmonary ROS, sleep apnea , COPD, former smoker,    Pulmonary exam normal breath sounds clear to auscultation       Cardiovascular Exercise Tolerance: Good hypertension, Pt. on medications and Pt. on home beta blockers + CAD and +CHF  + dysrhythmias Atrial Fibrillation  Rhythm:Regular Rate:Normal     Neuro/Psych CVA negative psych ROS   GI/Hepatic Neg liver ROS, GERD  Medicated and Controlled,  Endo/Other  negative endocrine ROS  Renal/GU ESRF and DialysisRenal disease  negative genitourinary   Musculoskeletal  (+) Arthritis ,   Abdominal   Peds  Hematology  (+) Blood dyscrasia, anemia ,   Anesthesia Other Findings   Reproductive/Obstetrics negative OB ROS                           Anesthesia Physical Anesthesia Plan  ASA: IV  Anesthesia Plan: MAC   Post-op Pain Management:    Induction: Intravenous  PONV Risk Score and Plan: 2 and Ondansetron and Propofol infusion  Airway Management Planned: Simple Face Mask  Additional Equipment:   Intra-op Plan:   Post-operative Plan:   Informed Consent: I have reviewed the patients History and Physical, chart, labs and discussed the procedure including the risks, benefits and alternatives for the proposed anesthesia with the patient or authorized representative who has indicated his/her understanding and acceptance.   Dental advisory given  Plan Discussed with: CRNA  Anesthesia Plan Comments:         Anesthesia Quick Evaluation

## 2018-10-09 ENCOUNTER — Ambulatory Visit: Payer: Medicare HMO | Admitting: Family

## 2018-10-09 ENCOUNTER — Ambulatory Visit (HOSPITAL_COMMUNITY)
Admission: RE | Admit: 2018-10-09 | Discharge: 2018-10-09 | Disposition: A | Discharge status: Home / Self Care | Payer: Medicare HMO | Source: Ambulatory Visit | Attending: Vascular Surgery | Admitting: Vascular Surgery

## 2018-10-09 ENCOUNTER — Ambulatory Visit (HOSPITAL_COMMUNITY): Payer: Medicare HMO | Admitting: Anesthesiology

## 2018-10-09 ENCOUNTER — Encounter (HOSPITAL_COMMUNITY): Payer: Self-pay | Admitting: Certified Registered"

## 2018-10-09 ENCOUNTER — Encounter (HOSPITAL_COMMUNITY)
Admission: RE | Disposition: A | Discharge status: Home / Self Care | Payer: Self-pay | Source: Ambulatory Visit | Attending: Vascular Surgery

## 2018-10-09 ENCOUNTER — Other Ambulatory Visit: Payer: Self-pay

## 2018-10-09 DIAGNOSIS — I4891 Unspecified atrial fibrillation: Secondary | ICD-10-CM | POA: Diagnosis not present

## 2018-10-09 DIAGNOSIS — I509 Heart failure, unspecified: Secondary | ICD-10-CM | POA: Insufficient documentation

## 2018-10-09 DIAGNOSIS — Z992 Dependence on renal dialysis: Secondary | ICD-10-CM | POA: Diagnosis not present

## 2018-10-09 DIAGNOSIS — Z7901 Long term (current) use of anticoagulants: Secondary | ICD-10-CM | POA: Diagnosis not present

## 2018-10-09 DIAGNOSIS — N186 End stage renal disease: Secondary | ICD-10-CM | POA: Diagnosis not present

## 2018-10-09 DIAGNOSIS — N185 Chronic kidney disease, stage 5: Secondary | ICD-10-CM | POA: Diagnosis not present

## 2018-10-09 DIAGNOSIS — Y832 Surgical operation with anastomosis, bypass or graft as the cause of abnormal reaction of the patient, or of later complication, without mention of misadventure at the time of the procedure: Secondary | ICD-10-CM | POA: Insufficient documentation

## 2018-10-09 DIAGNOSIS — T82898A Other specified complication of vascular prosthetic devices, implants and grafts, initial encounter: Secondary | ICD-10-CM | POA: Insufficient documentation

## 2018-10-09 DIAGNOSIS — M1990 Unspecified osteoarthritis, unspecified site: Secondary | ICD-10-CM | POA: Insufficient documentation

## 2018-10-09 DIAGNOSIS — I132 Hypertensive heart and chronic kidney disease with heart failure and with stage 5 chronic kidney disease, or end stage renal disease: Secondary | ICD-10-CM | POA: Insufficient documentation

## 2018-10-09 DIAGNOSIS — K219 Gastro-esophageal reflux disease without esophagitis: Secondary | ICD-10-CM | POA: Diagnosis not present

## 2018-10-09 DIAGNOSIS — Z79899 Other long term (current) drug therapy: Secondary | ICD-10-CM | POA: Diagnosis not present

## 2018-10-09 DIAGNOSIS — Z87891 Personal history of nicotine dependence: Secondary | ICD-10-CM | POA: Diagnosis not present

## 2018-10-09 DIAGNOSIS — I5023 Acute on chronic systolic (congestive) heart failure: Secondary | ICD-10-CM | POA: Diagnosis not present

## 2018-10-09 HISTORY — PX: INSERTION OF ARTERIOVENOUS (AV) ARTEGRAFT ARM: SHX6779

## 2018-10-09 LAB — POCT I-STAT 4, (NA,K, GLUC, HGB,HCT)
Glucose, Bld: 104 mg/dL — ABNORMAL HIGH (ref 70–99)
HEMATOCRIT: 29 % — AB (ref 39.0–52.0)
HEMOGLOBIN: 9.9 g/dL — AB (ref 13.0–17.0)
Potassium: 3.7 mmol/L (ref 3.5–5.1)
Sodium: 138 mmol/L (ref 135–145)

## 2018-10-09 LAB — SURGICAL PCR SCREEN
MRSA, PCR: NEGATIVE
Staphylococcus aureus: NEGATIVE

## 2018-10-09 LAB — PROTIME-INR
INR: 1.16
Prothrombin Time: 14.7 seconds (ref 11.4–15.2)

## 2018-10-09 SURGERY — INSERTION OF ARTERIOVENOUS (AV) ARTEGRAFT ARM
Anesthesia: Monitor Anesthesia Care | Site: Arm Upper

## 2018-10-09 MED ORDER — SODIUM CHLORIDE 0.9 % IV SOLN
INTRAVENOUS | Status: DC | PRN
  Administered 2018-10-09: 08:00:00

## 2018-10-09 MED ORDER — PROPOFOL 500 MG/50ML IV EMUL
INTRAVENOUS | Status: DC | PRN
  Administered 2018-10-09: 50 ug/kg/min via INTRAVENOUS

## 2018-10-09 MED ORDER — CARVEDILOL 3.125 MG PO TABS
ORAL_TABLET | ORAL | Status: AC
  Administered 2018-10-09: 3.125 mg via ORAL
  Filled 2018-10-09: qty 1

## 2018-10-09 MED ORDER — FENTANYL CITRATE (PF) 100 MCG/2ML IJ SOLN: 25.0000 ug | INTRAMUSCULAR | Status: DC | PRN

## 2018-10-09 MED ORDER — MIDAZOLAM HCL 2 MG/2ML IJ SOLN
INTRAMUSCULAR | Status: AC
  Filled 2018-10-09: qty 2

## 2018-10-09 MED ORDER — CARVEDILOL 3.125 MG PO TABS
3.1250 mg | ORAL_TABLET | Freq: Once | ORAL | Status: AC
  Administered 2018-10-09: 3.125 mg via ORAL

## 2018-10-09 MED ORDER — CHLORHEXIDINE GLUCONATE 4 % EX LIQD: 60.0000 mL | Freq: Once | CUTANEOUS | Status: DC

## 2018-10-09 MED ORDER — LIDOCAINE-EPINEPHRINE (PF) 1 %-1:200000 IJ SOLN
INTRAMUSCULAR | Status: AC
  Filled 2018-10-09: qty 30

## 2018-10-09 MED ORDER — CEFAZOLIN SODIUM-DEXTROSE 2-4 GM/100ML-% IV SOLN
2.0000 g | INTRAVENOUS | Status: AC
  Administered 2018-10-09: 2 g via INTRAVENOUS
  Filled 2018-10-09: qty 100

## 2018-10-09 MED ORDER — SODIUM CHLORIDE 0.9 % IV SOLN
INTRAVENOUS | Status: AC
  Filled 2018-10-09: qty 1.2

## 2018-10-09 MED ORDER — SODIUM CHLORIDE 0.9 % IV SOLN
INTRAVENOUS | Status: DC
  Administered 2018-10-09: 09:00:00 via INTRAVENOUS

## 2018-10-09 MED ORDER — 0.9 % SODIUM CHLORIDE (POUR BTL) OPTIME
TOPICAL | Status: DC | PRN
  Administered 2018-10-09: 1000 mL

## 2018-10-09 MED ORDER — FENTANYL CITRATE (PF) 250 MCG/5ML IJ SOLN
INTRAMUSCULAR | Status: DC | PRN
  Administered 2018-10-09: 50 ug via INTRAVENOUS

## 2018-10-09 MED ORDER — PROPOFOL 10 MG/ML IV BOLUS
INTRAVENOUS | Status: AC
  Filled 2018-10-09: qty 20

## 2018-10-09 MED ORDER — FENTANYL CITRATE (PF) 250 MCG/5ML IJ SOLN
INTRAMUSCULAR | Status: AC
  Filled 2018-10-09: qty 5

## 2018-10-09 MED ORDER — ONDANSETRON HCL 4 MG/2ML IJ SOLN
INTRAMUSCULAR | Status: DC | PRN
  Administered 2018-10-09: 4 mg via INTRAVENOUS

## 2018-10-09 MED ORDER — HYDROCODONE-ACETAMINOPHEN 5-325 MG PO TABS: 1.0000 | ORAL_TABLET | Freq: Four times a day (QID) | ORAL | 0 refills | Status: DC | PRN

## 2018-10-09 MED ORDER — LIDOCAINE-EPINEPHRINE (PF) 1 %-1:200000 IJ SOLN
INTRAMUSCULAR | Status: DC | PRN
  Administered 2018-10-09: 30 mL

## 2018-10-09 MED ORDER — LIDOCAINE HCL (PF) 1 % IJ SOLN
INTRAMUSCULAR | Status: AC
  Filled 2018-10-09: qty 30

## 2018-10-09 MED ORDER — MIDAZOLAM HCL 2 MG/2ML IJ SOLN
INTRAMUSCULAR | Status: DC | PRN
  Administered 2018-10-09: 1 mg via INTRAVENOUS

## 2018-10-09 SURGICAL SUPPLY — 41 items
ADH SKN CLS APL DERMABOND .7 (GAUZE/BANDAGES/DRESSINGS) ×2
ARMBAND PINK RESTRICT EXTREMIT (MISCELLANEOUS) ×8 IMPLANT
CANISTER SUCT 3000ML PPV (MISCELLANEOUS) ×4 IMPLANT
CLIP VESOCCLUDE MED 6/CT (CLIP) ×4 IMPLANT
CLIP VESOCCLUDE SM WIDE 6/CT (CLIP) ×7 IMPLANT
COVER WAND RF STERILE (DRAPES) ×4 IMPLANT
DERMABOND ADVANCED (GAUZE/BANDAGES/DRESSINGS) ×2
DERMABOND ADVANCED .7 DNX12 (GAUZE/BANDAGES/DRESSINGS) ×2 IMPLANT
ELECT REM PT RETURN 9FT ADLT (ELECTROSURGICAL) ×4
ELECTRODE REM PT RTRN 9FT ADLT (ELECTROSURGICAL) ×2 IMPLANT
GLOVE BIO SURGEON STRL SZ 6.5 (GLOVE) ×2 IMPLANT
GLOVE BIO SURGEON STRL SZ7.5 (GLOVE) ×4 IMPLANT
GLOVE BIO SURGEONS STRL SZ 6.5 (GLOVE) ×1
GLOVE BIOGEL PI IND STRL 6.5 (GLOVE) ×2 IMPLANT
GLOVE BIOGEL PI IND STRL 7.0 (GLOVE) ×1 IMPLANT
GLOVE BIOGEL PI IND STRL 7.5 (GLOVE) ×1 IMPLANT
GLOVE BIOGEL PI INDICATOR 6.5 (GLOVE) ×4
GLOVE BIOGEL PI INDICATOR 7.0 (GLOVE) ×2
GLOVE BIOGEL PI INDICATOR 7.5 (GLOVE) ×2
GLOVE ECLIPSE 7.0 STRL STRAW (GLOVE) ×3 IMPLANT
GOWN STRL REUS W/ TWL LRG LVL3 (GOWN DISPOSABLE) ×4 IMPLANT
GOWN STRL REUS W/ TWL XL LVL3 (GOWN DISPOSABLE) ×2 IMPLANT
GOWN STRL REUS W/TWL LRG LVL3 (GOWN DISPOSABLE) ×8
GOWN STRL REUS W/TWL XL LVL3 (GOWN DISPOSABLE) ×4
GRAFT COLLAGEN VASCULAR 7X40 (Vascular Products) ×3 IMPLANT
HEMOSTAT SNOW SURGICEL 2X4 (HEMOSTASIS) IMPLANT
INSERT FOGARTY SM (MISCELLANEOUS) ×7 IMPLANT
KIT BASIN OR (CUSTOM PROCEDURE TRAY) ×4 IMPLANT
KIT TURNOVER KIT B (KITS) ×4 IMPLANT
NS IRRIG 1000ML POUR BTL (IV SOLUTION) ×7 IMPLANT
PACK CV ACCESS (CUSTOM PROCEDURE TRAY) ×4 IMPLANT
PAD ARMBOARD 7.5X6 YLW CONV (MISCELLANEOUS) ×8 IMPLANT
SUT MNCRL AB 4-0 PS2 18 (SUTURE) ×3 IMPLANT
SUT PROLENE 6 0 BV (SUTURE) IMPLANT
SUT SILK 2 0 SH (SUTURE) IMPLANT
SUT VIC AB 3-0 SH 27 (SUTURE) ×12
SUT VIC AB 3-0 SH 27X BRD (SUTURE) ×5 IMPLANT
SYR TOOMEY 50ML (SYRINGE) ×3 IMPLANT
TOWEL GREEN STERILE (TOWEL DISPOSABLE) ×4 IMPLANT
UNDERPAD 30X30 (UNDERPADS AND DIAPERS) ×4 IMPLANT
WATER STERILE IRR 1000ML POUR (IV SOLUTION) ×4 IMPLANT

## 2018-10-09 NOTE — Op Note (Signed)
    Patient name: David [name] MRN: [id number] DOB: January 18, 1941 Sex: male  10/09/2018 Pre-operative Diagnosis: esrd Post-operative diagnosis:  Same Surgeon:  Erlene Quan C. Donzetta Matters, MD Assistant: Laurence Slate, PA Procedure Performed: Right upper arm brachial to axillary AV graft with 6 mm Artegraft  Indications: 77 year old male with multiple previous left upper extremity accesses and recently failed a right arm for stage basilic vein fistula.  He is now indicated for right arm grafting.  Findings: The vein in the axilla measured approximately 5 mm in diameter and the brachial artery was approximately 3 mm external diameter.  We placed a 6 mm Artegraft and at completion there was a strong thrill as well as a palpable radial pulse at the wrist.   Procedure:  The patient was identified in the holding area and taken to the operating room where is placed supine the operating table and MAC anesthesia induced.  He was sterilely prepped and draped in the right upper extremity usual fashion antibiotics were administered and a timeout was called.  We began with anesthetizing the right upper extremity overlying the previous incision on the artery and the axillary vein and the tunnel site with 1% lidocaine with epinephrine to a total of 20 cc.  We then made a longitudinal incision overlying the axillary vein dissected down marked for orientation placed a vessel loop around it.  We then opened our previous incision from our basilic vein transposed fistula.  We dissected out our fistula clamped and divided it.  The artery was dissected out and a vessel loop was placed around it.  We then tunneled a 6 mm Artegraft.  We started with the venous anastomosis.  The vein was clamped proximally and distally and transected.  Proximal aspect was tied off with 2-0 silk suture.  We dilated the distal segment flushed with heparinized saline.  The was then sewn end-to-end to the Artegraft with 5-0 Prolene suture.  Upon completion  we then flushed through the Artegraft and reclamped it.  We trimmed the Artegraft to size near the antecubitum.  The artery was clamped distally and proximally and opened longitudinally flushed with heparinized saline both directions.  The graft was then sewn end-to-side with 6-0 Prolene suture.  At this time prior to the completion anastomosis we allowed flushing techniques in all directions.  Upon completion there was a strong thrill in the runoff vein confirmed with Doppler and a palpable radial pulse at the wrist.  Satisfied with this we irrigated both wounds closed in layers with Vicryl and Monocryl.  He was then awakened from anesthesia having tolerated the procedure well without immediate complication.  All counts were correct at completion.  EBL 25 cc.   Kobe Ofallon C. Donzetta Matters, MD [name] Office: [phone] Pager: [phone]

## 2018-10-09 NOTE — Anesthesia Procedure Notes (Signed)
Procedure Name: MAC Date/Time: 10/09/2018 8:39 AM Performed by: Imagene Riches, CRNA Pre-anesthesia Checklist: Patient identified, Emergency Drugs available, Suction available, Patient being monitored and Timeout performed Patient Re-evaluated:Patient Re-evaluated prior to induction Oxygen Delivery Method: Simple face mask

## 2018-10-09 NOTE — Anesthesia Postprocedure Evaluation (Signed)
Anesthesia Post Note  Patient: [facility]  Procedure(s) Performed: INSERTION OF ARTERIOVENOUS (AV) 16m x 41cm ARTEGRAFT LEFT UPPER ARM (Arm Upper)     Patient location during evaluation: PACU Anesthesia Type: MAC Level of consciousness: awake and alert Pain management: pain level controlled Vital Signs Assessment: post-procedure vital signs reviewed and stable Respiratory status: spontaneous breathing, nonlabored ventilation and respiratory function stable Cardiovascular status: stable and blood pressure returned to baseline Postop Assessment: no apparent nausea or vomiting Anesthetic complications: no    Last Vitals:  Vitals:   10/09/18 1006 10/09/18 1021  BP: 128/71 134/71  Pulse: 70 67  Resp: 15 17  Temp:  (!) 36.4 C  SpO2: 98% 99%    Last Pain:  Vitals:   10/09/18 1021  TempSrc:   PainSc: 0-No pain                 Abigale Dorow,W. EDMOND

## 2018-10-09 NOTE — H&P (Signed)
   History and Physical Update  The patient was interviewed and re-examined.  The patient's previous History and Physical has been reviewed and is unchanged from recent office visit. Plan for right arm avg.   Jakalyn Kratky C. Donzetta Matters, MD [facility] [place] Office: [phone] Pager: [phone]   10/09/2018, 8:28 AM

## 2018-10-09 NOTE — Discharge Instructions (Signed)
° °  [facility] ° °Discharge Instructions ° °AV Fistula or Graft Surgery for Dialysis Access ° °Please refer to the following instructions for your post-procedure care. Your surgeon or physician assistant will discuss any changes with you. ° °Activity ° °You may drive the day following your surgery, if you are comfortable and no longer taking prescription pain medication. Resume full activity as the soreness in your incision resolves. ° °Bathing/Showering ° °You may shower after you go home. Keep your incision dry for 48 hours. Do not soak in a bathtub, hot tub, or swim until the incision heals completely. You may not shower if you have a hemodialysis catheter. ° °Incision Care ° °Clean your incision with mild soap and water after 48 hours. Pat the area dry with a clean towel. You do not need a bandage unless otherwise instructed. Do not apply any ointments or creams to your incision. You may have skin glue on your incision. Do not peel it off. It will come off on its own in about one week. Your arm may swell a bit after surgery. To reduce swelling use pillows to elevate your arm so it is above your heart. Your doctor will tell you if you need to lightly wrap your arm with an ACE bandage. ° °Diet ° °Resume your normal diet. There are not special food restrictions following this procedure. In order to heal from your surgery, it is CRITICAL to get adequate nutrition. Your body requires vitamins, minerals, and protein. Vegetables are the best source of vitamins and minerals. Vegetables also provide the perfect balance of protein. Processed food has little nutritional value, so try to avoid this. ° °Medications ° °Resume taking all of your medications. If your incision is causing pain, you may take over-the counter pain relievers such as acetaminophen (Tylenol). If you were prescribed a stronger pain medication, please be aware these medications can cause nausea and constipation. Prevent  nausea by taking the medication with a snack or meal. Avoid constipation by drinking plenty of fluids and eating foods with high amount of fiber, such as fruits, vegetables, and grains. Do not take Tylenol if you are taking prescription pain medications. ° ° ° ° °Follow up °Your surgeon may want to see you in the office following your access surgery. If so, this will be arranged at the time of your surgery. ° °Please call us immediately for any of the following conditions: ° °Increased pain, redness, drainage (pus) from your incision site °Fever of 101 degrees or higher °Severe or worsening pain at your incision site °Hand pain or numbness. ° °Reduce your risk of vascular disease: ° °Stop smoking. If you would like help, call [facility] at 1-800-QUIT-NOW (1-800-784-8669) or [place] at 336-586-4000 ° °Manage your cholesterol °Maintain a desired weight °Control your diabetes °Keep your blood pressure down ° °Dialysis ° °It will take several weeks to several months for your new dialysis access to be ready for use. Your surgeon will determine when it is OK to use it. Your nephrologist will continue to direct your dialysis. You can continue to use your Permcath until your new access is ready for use. ° °If you have any questions, please call the office at [phone]. ° °

## 2018-10-09 NOTE — Transfer of Care (Signed)
Immediate Anesthesia Transfer of Care Note  Patient: [facility]  Procedure(s) Performed: INSERTION OF ARTERIOVENOUS (AV) 20m x 41cm ARTEGRAFT LEFT UPPER ARM (Arm Upper)  Patient Location: PACU  Anesthesia Type:MAC  Level of Consciousness: awake, alert  and oriented  Airway & Oxygen Therapy: Patient Spontanous Breathing  Post-op Assessment: Report given to RN and Post -op Vital signs reviewed and stable  Post vital signs: Reviewed and stable  Last Vitals:  Vitals Value Taken Time  BP    Temp    Pulse 71 10/09/2018  9:51 AM  Resp 14 10/09/2018  9:51 AM  SpO2 97 % 10/09/2018  9:51 AM  Vitals shown include unvalidated device data.  Last Pain:  Vitals:   10/09/18 0727  TempSrc:   PainSc: 0-No pain      Patients Stated Pain Goal: 2 [id number]  Complications: No apparent anesthesia complications

## 2018-10-10 ENCOUNTER — Encounter (HOSPITAL_COMMUNITY): Payer: Self-pay | Admitting: Vascular Surgery

## 2018-10-10 DIAGNOSIS — N186 End stage renal disease: Secondary | ICD-10-CM | POA: Diagnosis not present

## 2018-10-10 DIAGNOSIS — N2581 Secondary hyperparathyroidism of renal origin: Secondary | ICD-10-CM | POA: Diagnosis not present

## 2018-10-10 LAB — PROTIME-INR: INR: 1.6 — AB (ref 0.9–1.1)

## 2018-10-12 DIAGNOSIS — N186 End stage renal disease: Secondary | ICD-10-CM | POA: Diagnosis not present

## 2018-10-12 DIAGNOSIS — N2581 Secondary hyperparathyroidism of renal origin: Secondary | ICD-10-CM | POA: Diagnosis not present

## 2018-10-15 DIAGNOSIS — N186 End stage renal disease: Secondary | ICD-10-CM | POA: Diagnosis not present

## 2018-10-15 DIAGNOSIS — N2581 Secondary hyperparathyroidism of renal origin: Secondary | ICD-10-CM | POA: Diagnosis not present

## 2018-10-17 DIAGNOSIS — N186 End stage renal disease: Secondary | ICD-10-CM | POA: Diagnosis not present

## 2018-10-17 DIAGNOSIS — N2581 Secondary hyperparathyroidism of renal origin: Secondary | ICD-10-CM | POA: Diagnosis not present

## 2018-10-19 ENCOUNTER — Other Ambulatory Visit: Payer: Self-pay | Admitting: Cardiovascular Disease

## 2018-10-19 DIAGNOSIS — N186 End stage renal disease: Secondary | ICD-10-CM | POA: Diagnosis not present

## 2018-10-19 DIAGNOSIS — N2581 Secondary hyperparathyroidism of renal origin: Secondary | ICD-10-CM | POA: Diagnosis not present

## 2018-10-21 ENCOUNTER — Other Ambulatory Visit: Payer: Self-pay

## 2018-10-21 ENCOUNTER — Encounter (HOSPITAL_COMMUNITY): Payer: Self-pay | Admitting: Emergency Medicine

## 2018-10-21 ENCOUNTER — Observation Stay (HOSPITAL_COMMUNITY)
Admission: EM | Admit: 2018-10-21 | Discharge: 2018-10-23 | Disposition: A | Discharge status: Home / Self Care | Payer: Medicare HMO | Attending: Internal Medicine | Admitting: Internal Medicine

## 2018-10-21 ENCOUNTER — Emergency Department (HOSPITAL_COMMUNITY): Payer: Medicare HMO

## 2018-10-21 DIAGNOSIS — I4891 Unspecified atrial fibrillation: Secondary | ICD-10-CM | POA: Diagnosis not present

## 2018-10-21 DIAGNOSIS — Z992 Dependence on renal dialysis: Secondary | ICD-10-CM | POA: Insufficient documentation

## 2018-10-21 DIAGNOSIS — J811 Chronic pulmonary edema: Secondary | ICD-10-CM | POA: Diagnosis present

## 2018-10-21 DIAGNOSIS — K219 Gastro-esophageal reflux disease without esophagitis: Secondary | ICD-10-CM | POA: Diagnosis not present

## 2018-10-21 DIAGNOSIS — N186 End stage renal disease: Secondary | ICD-10-CM

## 2018-10-21 DIAGNOSIS — I5043 Acute on chronic combined systolic (congestive) and diastolic (congestive) heart failure: Secondary | ICD-10-CM | POA: Diagnosis not present

## 2018-10-21 DIAGNOSIS — M109 Gout, unspecified: Secondary | ICD-10-CM | POA: Diagnosis not present

## 2018-10-21 DIAGNOSIS — J449 Chronic obstructive pulmonary disease, unspecified: Secondary | ICD-10-CM | POA: Diagnosis not present

## 2018-10-21 DIAGNOSIS — Z79899 Other long term (current) drug therapy: Secondary | ICD-10-CM | POA: Insufficient documentation

## 2018-10-21 DIAGNOSIS — I2510 Atherosclerotic heart disease of native coronary artery without angina pectoris: Secondary | ICD-10-CM | POA: Diagnosis not present

## 2018-10-21 DIAGNOSIS — J9601 Acute respiratory failure with hypoxia: Secondary | ICD-10-CM | POA: Diagnosis not present

## 2018-10-21 DIAGNOSIS — Z87891 Personal history of nicotine dependence: Secondary | ICD-10-CM | POA: Insufficient documentation

## 2018-10-21 DIAGNOSIS — E785 Hyperlipidemia, unspecified: Secondary | ICD-10-CM | POA: Insufficient documentation

## 2018-10-21 DIAGNOSIS — I132 Hypertensive heart and chronic kidney disease with heart failure and with stage 5 chronic kidney disease, or end stage renal disease: Secondary | ICD-10-CM | POA: Insufficient documentation

## 2018-10-21 DIAGNOSIS — D72829 Elevated white blood cell count, unspecified: Secondary | ICD-10-CM | POA: Diagnosis present

## 2018-10-21 DIAGNOSIS — Z7901 Long term (current) use of anticoagulants: Secondary | ICD-10-CM | POA: Diagnosis not present

## 2018-10-21 DIAGNOSIS — Z8674 Personal history of sudden cardiac arrest: Secondary | ICD-10-CM | POA: Diagnosis not present

## 2018-10-21 DIAGNOSIS — R0602 Shortness of breath: Secondary | ICD-10-CM | POA: Diagnosis not present

## 2018-10-21 DIAGNOSIS — R000 Tachycardia, unspecified: Secondary | ICD-10-CM | POA: Diagnosis not present

## 2018-10-21 DIAGNOSIS — G4730 Sleep apnea, unspecified: Secondary | ICD-10-CM | POA: Insufficient documentation

## 2018-10-21 DIAGNOSIS — I161 Hypertensive emergency: Secondary | ICD-10-CM | POA: Diagnosis present

## 2018-10-21 DIAGNOSIS — I160 Hypertensive urgency: Principal | ICD-10-CM | POA: Insufficient documentation

## 2018-10-21 DIAGNOSIS — I480 Paroxysmal atrial fibrillation: Secondary | ICD-10-CM | POA: Diagnosis not present

## 2018-10-21 DIAGNOSIS — I5042 Chronic combined systolic (congestive) and diastolic (congestive) heart failure: Secondary | ICD-10-CM | POA: Diagnosis present

## 2018-10-21 DIAGNOSIS — R0902 Hypoxemia: Secondary | ICD-10-CM | POA: Diagnosis not present

## 2018-10-21 DIAGNOSIS — I482 Chronic atrial fibrillation, unspecified: Secondary | ICD-10-CM | POA: Diagnosis present

## 2018-10-21 DIAGNOSIS — B20 Human immunodeficiency virus [HIV] disease: Secondary | ICD-10-CM | POA: Diagnosis not present

## 2018-10-21 DIAGNOSIS — D509 Iron deficiency anemia, unspecified: Secondary | ICD-10-CM | POA: Insufficient documentation

## 2018-10-21 DIAGNOSIS — Z8546 Personal history of malignant neoplasm of prostate: Secondary | ICD-10-CM | POA: Diagnosis not present

## 2018-10-21 DIAGNOSIS — J810 Acute pulmonary edema: Secondary | ICD-10-CM | POA: Diagnosis not present

## 2018-10-21 DIAGNOSIS — R0689 Other abnormalities of breathing: Secondary | ICD-10-CM | POA: Diagnosis not present

## 2018-10-21 DIAGNOSIS — Z8673 Personal history of transient ischemic attack (TIA), and cerebral infarction without residual deficits: Secondary | ICD-10-CM | POA: Diagnosis not present

## 2018-10-21 DIAGNOSIS — I447 Left bundle-branch block, unspecified: Secondary | ICD-10-CM | POA: Diagnosis not present

## 2018-10-21 LAB — I-STAT CHEM 8, ED
BUN: 47 mg/dL — AB (ref 8–23)
CREATININE: 10.1 mg/dL — AB (ref 0.61–1.24)
Calcium, Ion: 1.07 mmol/L — ABNORMAL LOW (ref 1.15–1.40)
Chloride: 99 mmol/L (ref 98–111)
GLUCOSE: 225 mg/dL — AB (ref 70–99)
HCT: 40 % (ref 39.0–52.0)
HEMOGLOBIN: 13.6 g/dL (ref 13.0–17.0)
POTASSIUM: 3.8 mmol/L (ref 3.5–5.1)
Sodium: 139 mmol/L (ref 135–145)
TCO2: 29 mmol/L (ref 22–32)

## 2018-10-21 LAB — COMPREHENSIVE METABOLIC PANEL
ALK PHOS: 108 U/L (ref 38–126)
ALT: 20 U/L (ref 0–44)
AST: 33 U/L (ref 15–41)
Albumin: 3.9 g/dL (ref 3.5–5.0)
Anion gap: 17 — ABNORMAL HIGH (ref 5–15)
BUN: 47 mg/dL — ABNORMAL HIGH (ref 8–23)
CALCIUM: 9 mg/dL (ref 8.9–10.3)
CO2: 25 mmol/L (ref 22–32)
Chloride: 98 mmol/L (ref 98–111)
Creatinine, Ser: 9.89 mg/dL — ABNORMAL HIGH (ref 0.61–1.24)
GFR calc Af Amer: 5 mL/min — ABNORMAL LOW (ref >60)
GFR calc non Af Amer: 4 mL/min — ABNORMAL LOW (ref >60)
GLUCOSE: 230 mg/dL — AB (ref 70–99)
Potassium: 3.9 mmol/L (ref 3.5–5.1)
SODIUM: 140 mmol/L (ref 135–145)
Total Bilirubin: 0.7 mg/dL (ref 0.3–1.2)
Total Protein: 7.6 g/dL (ref 6.5–8.1)

## 2018-10-21 LAB — CBC
HCT: 37.1 % — ABNORMAL LOW (ref 39.0–52.0)
HEMOGLOBIN: 11.5 g/dL — AB (ref 13.0–17.0)
MCH: 34.6 pg — ABNORMAL HIGH (ref 26.0–34.0)
MCHC: 31 g/dL (ref 30.0–36.0)
MCV: 111.7 fL — ABNORMAL HIGH (ref 80.0–100.0)
NRBC: 0 % (ref 0.0–0.2)
Platelets: 301 10*3/uL (ref 150–400)
RBC: 3.32 MIL/uL — ABNORMAL LOW (ref 4.22–5.81)
RDW: 14.6 % (ref 11.5–15.5)
WBC: 12.6 10*3/uL — ABNORMAL HIGH (ref 4.0–10.5)

## 2018-10-21 LAB — I-STAT TROPONIN, ED: Troponin i, poc: 0.05 ng/mL (ref 0.00–0.08)

## 2018-10-21 MED ORDER — HYDROXYZINE HCL 10 MG PO TABS
10.0000 mg | ORAL_TABLET | Freq: Three times a day (TID) | ORAL | Status: DC | PRN
  Filled 2018-10-21: qty 1

## 2018-10-21 MED ORDER — CHLORHEXIDINE GLUCONATE CLOTH 2 % EX PADS
6.0000 | MEDICATED_PAD | Freq: Every day | CUTANEOUS | Status: DC
  Administered 2018-10-23: 6 via TOPICAL

## 2018-10-21 MED ORDER — LEVALBUTEROL HCL 1.25 MG/0.5ML IN NEBU
1.2500 mg | INHALATION_SOLUTION | Freq: Four times a day (QID) | RESPIRATORY_TRACT | Status: DC
  Administered 2018-10-22 – 2018-10-23 (×4): 1.25 mg via RESPIRATORY_TRACT
  Filled 2018-10-21 (×5): qty 0.5

## 2018-10-21 MED ORDER — ACETAMINOPHEN 325 MG PO TABS: 650.0000 mg | ORAL_TABLET | Freq: Four times a day (QID) | ORAL | Status: DC | PRN

## 2018-10-21 MED ORDER — AMIODARONE HCL 200 MG PO TABS
200.0000 mg | ORAL_TABLET | Freq: Every day | ORAL | Status: DC
  Administered 2018-10-22 – 2018-10-23 (×2): 200 mg via ORAL
  Filled 2018-10-21 (×2): qty 1

## 2018-10-21 MED ORDER — ACETAMINOPHEN 650 MG RE SUPP: 650.0000 mg | Freq: Four times a day (QID) | RECTAL | Status: DC | PRN

## 2018-10-21 MED ORDER — NITROGLYCERIN IN D5W 200-5 MCG/ML-% IV SOLN
INTRAVENOUS | Status: AC
  Filled 2018-10-21: qty 250

## 2018-10-21 MED ORDER — ZOLPIDEM TARTRATE 5 MG PO TABS: 5.0000 mg | ORAL_TABLET | Freq: Every evening | ORAL | Status: DC | PRN

## 2018-10-21 MED ORDER — WARFARIN - PHARMACIST DOSING INPATIENT: Freq: Every day | Status: DC

## 2018-10-21 MED ORDER — NITROGLYCERIN IN D5W 200-5 MCG/ML-% IV SOLN
0.0000 ug/min | INTRAVENOUS | Status: DC
  Administered 2018-10-21: 50 ug/min via INTRAVENOUS

## 2018-10-21 MED ORDER — HYDRALAZINE HCL 20 MG/ML IJ SOLN: 5.0000 mg | INTRAMUSCULAR | Status: DC | PRN

## 2018-10-21 MED ORDER — CARVEDILOL 3.125 MG PO TABS
3.1250 mg | ORAL_TABLET | Freq: Two times a day (BID) | ORAL | Status: DC
  Administered 2018-10-22 – 2018-10-23 (×2): 3.125 mg via ORAL
  Filled 2018-10-21 (×2): qty 1

## 2018-10-21 MED ORDER — SENNOSIDES-DOCUSATE SODIUM 8.6-50 MG PO TABS: 1.0000 | ORAL_TABLET | Freq: Every evening | ORAL | Status: DC | PRN

## 2018-10-21 NOTE — ED Notes (Signed)
ED Provider at bedside. 

## 2018-10-21 NOTE — Progress Notes (Signed)
Discussed with ER Dr. Dayna Barker. Chart reviewed. Patient is ESRD on HD at [facility], last HD on 11/16 with with 2.6 Kg UF. He is admitted with respiratory distress and HTN requiring bipap and nitro drip. His BP is improving to systolic 242R. Potassium level 3.8, Hb 11.5.  -plan for urgent dialysis tonight, 3K, 4 Kg UF goal via right IJ TDC.

## 2018-10-21 NOTE — ED Triage Notes (Signed)
Pt arrived [facility] from Home for c/o SOB. Pt is on dialysis, hx of CHF, COPD, ESRD. Was on NRB by Fire departmement sat 98%, pt had increase work of breathing and was placed on CPAP enroute.  BP 220/143 P 110-120 O2 63% RA, 98% NRB mask. uknown on CPAP.

## 2018-10-21 NOTE — H&P (Signed)
History and Physical    [facility] BSJ:[id number] DOB: 17-Feb-1941 DOA: 10/21/2018  Referring MD/NP/PA:   PCP: [name], NP   Patient coming from:  The patient is coming from home.  At baseline, pt is independent for most of ADL.        Chief Complaint: SOB  HPI: David [name] is a 77 y.o. male with medical history significant of ESRD-HD (TTS), hypertension, hyperlipidemia, TIA, gout, CHF with EF of 15%, cardiac arrest, CAD, HIV (CD4=380), prostate cancer, who presents with shortness of breath.   Patient states that he developed shortness of breath today, which has been progressively getting worse.  He can barely speaking full sentences in ED.  Patient hascough with clear mucus production.  Denies chest pain, fever or chills.  No nausea, vomiting, diarrhea, abdominal pain, symptoms of UTI or unilateral weakness.  Patient states that he is compliant to dialysis, last dialysis was on Saturday.  Patient is hypertensive with blood pressure 202/126 whicn improved to 156/106 on IV nitroglycerin drip.  ED Course: pt was found to have WBC 12.6, negative troponin, potassium 3.8, creatinine 10.1, BUN 47, temperature 95, tachycardia, tachypnea, oxygen saturation 97% on room air, BiPAP started, chest x-ray showed pulmonary edema and airspace disease in RML and RLL. Pt is placed on stepdown bed for observation.  Nephrology was consulted for urgent dialysis.  Review of Systems:   General: no fevers, chills, has poor appetite, has fatigue HEENT: no blurry vision, hearing changes or sore throat Respiratory: has dyspnea, coughing, no wheezing CV: no chest pain, no palpitations GI: no nausea, vomiting, abdominal pain, diarrhea, constipation GU: no dysuria, burning on urination, increased urinary frequency, hematuria  Ext: no leg edema Neuro: no unilateral weakness, numbness, or tingling, no vision change or hearing loss Skin: no rash, no skin tear. MSK: No muscle spasm, no  deformity, no limitation of range of movement in spin Heme: No easy bruising.  Travel history: No recent long distant travel.  Allergy:  Allergies  Allergen Reactions  . Cozaar [Losartan Potassium] Other (See Comments)    Causes constipation     Past Medical History:  Diagnosis Date  . Acute on chronic systolic and diastolic heart failure, NYHA class 4 ([place])   . Anemia, iron deficiency 11/15/2011  . Arthritis   . Arthritis    "hands, right knee, feet" (02/21/2018)  . Atrial fibrillation ([place])   . Cancer [facility])    hx of prostate; s/p radioactive seed implant 10/2009 Dr Janice Norrie  . Cardiac arrest ([place]) 02/17/2018  . CHF (congestive heart failure) ([place])   . Chronic kidney disease   . Chronic lower back pain   . CKD (chronic kidney disease) stage V requiring chronic dialysis ([place])   . ESRD on dialysis ([place])    started 02/2018  . GERD (gastroesophageal reflux disease)   . Glaucoma   . Gout    daily RX (02/21/2018)  . Heart murmur    "mild" per pt  . Hepatitis    years ago  . History of cardiac cath 2004   negative for CAD  . History of myocardial perfusion scan 02/2010   negative for coronary insufficiency (LVEF 27%)  . HIV (human immunodeficiency virus infection) ([place])   . HIV infection ([place])   . Hyperlipidemia   . Hypertension    followed by [facility] and Vascular (Dr Dani Gobble Croitoru)  . Hypertension   . Nonischemic cardiomyopathy ([place])   . Pneumonia 11/2017  . Prostate cancer ([place])   .  Sinus bradycardia   . Sleep apnea    does not use a cpap  . Stroke [facility])    "mini stroke" years ago  . Systolic and diastolic CHF, acute ([place]) 06/2010   felt to be secondary to hypertensive cardiomyopathy    Past Surgical History:  Procedure Laterality Date  . AV FISTULA PLACEMENT Right 10/13/2016   Procedure: ARTERIOVENOUS (AV) FISTULA CREATION;  Surgeon: [name], MD;  Location: [place];  Service: Vascular;  Laterality: Right;  . AV FISTULA PLACEMENT Right 10/13/2016    [name] [phone]  . AV FISTULA PLACEMENT Left 02/25/2018   Procedure: INSERTION OF ARTERIOVENOUS (AV) GORE-TEX GRAFT LEFT UPPER ARM;  Surgeon: Angelia Mould, MD;  Location: [place];  Service: Vascular;  Laterality: Left;  . AV FISTULA PLACEMENT Right 08/07/2018   Procedure: Creation of right arm brachiocephalic Fistula;  Surgeon: Waynetta Sandy, MD;  Location: [place];  Service: Vascular;  Laterality: Right;  . BASCILIC VEIN TRANSPOSITION Left 06/24/2015   Procedure: BASILIC VEIN TRANSPOSITION;  Surgeon: Mal Misty, MD;  Location: [place];  Service: Vascular;  Laterality: Left;  . BASCILIC VEIN TRANSPOSITION Left 0/27/2536 Bascilic vein transposition (Left)   [name] [phone]  . CARDIAC CATHETERIZATION  02/04/2003   mildly depressed LV systolic fx EF 59%,DGLOVF coronaries/abdominal aorta/renal arteries.  Marland Kitchen CARDIAC CATHETERIZATION  2004  . CATARACT EXTRACTION, BILATERAL Bilateral   . COLONOSCOPY    . ESOPHAGOGASTRODUODENOSCOPY (EGD) WITH PROPOFOL N/A 05/05/2014   Procedure: ESOPHAGOGASTRODUODENOSCOPY (EGD) WITH PROPOFOL;  Surgeon: Missy Sabins, MD;  Location: [facility];  Service: Endoscopy;  Laterality: N/A;  . EYE SURGERY Bilateral    cataract surgery   . EYE SURGERY Bilateral    glaucoma surgery  . EYE SURGERY    . GLAUCOMA SURGERY Bilateral   . INSERTION OF ARTERIOVENOUS (AV) ARTEGRAFT ARM  10/09/2018   Procedure: INSERTION OF ARTERIOVENOUS (AV) 25mm x 41cm ARTEGRAFT LEFT UPPER ARM;  Surgeon: Waynetta Sandy, MD;  Location: [place];  Service: Vascular;;  . INSERTION PROSTATE RADIATION SEED    . IR FLUORO GUIDE CV LINE RIGHT  02/18/2018  . IR US GUIDE VASC ACCESS RIGHT  02/18/2018  . LEFT HEART CATH AND CORONARY ANGIOGRAPHY N/A 02/20/2018   Procedure: LEFT HEART CATH AND CORONARY ANGIOGRAPHY;  Surgeon: Jettie Booze, MD;  Location: [place] CV [facility];  Service: Cardiovascular;  Laterality: N/A;  . NM MYOCAR PERF WALL MOTION  02/21/2010   normal  . RADIOACTIVE SEED  IMPLANT  2010   prostate cancer  . THROMBECTOMY W/ EMBOLECTOMY Left 02/26/2018   Procedure: THROMBECTOMY ARTERIOVENOUS GRAFT;  Surgeon: Angelia Mould, MD;  Location: [place];  Service: Vascular;  Laterality: Left;  . ULTRASOUND GUIDANCE FOR VASCULAR ACCESS  02/20/2018   Procedure: Ultrasound Guidance For Vascular Access;  Surgeon: Jettie Booze, MD;  Location: [place] CV [facility];  Service: Cardiovascular;;  . UPPER EXTREMITY VENOGRAPHY N/A 07/08/2018   Procedure: UPPER EXTREMITY VENOGRAPHY;  Surgeon: Waynetta Sandy, MD;  Location: [place] CV [facility];  Service: Cardiovascular;  Laterality: N/A;  Bilateral  . US ECHOCARDIOGRAPHY  02/06/2012   mild LVH,LA mod. dilated,mild-mod. 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 AI.    Social History:  reports that he quit smoking about 13 years ago. His smoking use included cigarettes. He has a 30.00 pack-year smoking history. He has never used smokeless tobacco. He reports that he drinks alcohol. He reports that he does not use drugs.  Family History:  Family History  Problem Relation Age of Onset  . Hypertension Mother   . Thyroid disease Mother   . Cholelithiasis Daughter   . Cholelithiasis Son   . Hypertension Maternal Grandmother   . Diabetes Maternal Grandmother   . Heart attack Neg Hx   . Hyperlipidemia Neg Hx      Prior to Admission medications   Medication Sig Start Date End Date Taking? Authorizing Provider  allopurinol (ZYLOPRIM) 100 MG tablet Take 100 mg by mouth every evening.  06/27/18   [provider]  amiodarone (PACERONE) 200 MG tablet TAKE 1 TABLET(200 MG) BY MOUTH DAILY Patient taking differently: Take 200 mg by mouth daily.  06/05/18   Croitoru, Mihai, MD  AURYXIA 1 GM 210 MG(Fe) tablet Take 420 mg by mouth 3 (three) times daily with meals.  08/05/18   [provider]  brimonidine (ALPHAGAN) 0.15 % ophthalmic solution Place 1 drop into the left eye 2 (two) times daily.  11/15/17   [provider]  carvedilol (COREG) 3.125 MG tablet Take 1 tablet (3.125 mg total) by mouth 2 (two) times daily. 06/10/18 06/05/19  Croitoru, Mihai, MD  DESCOVY 200-25 MG tablet Take 1 tablet by mouth every morning. 01/20/18   [provider]  dorzolamide-timolol (COSOPT) 22.3-6.8 MG/ML ophthalmic solution Place 1 drop into the left eye 2 (two) times daily. 12/17/17   [provider]  HYDROcodone-acetaminophen (NORCO/VICODIN) 5-325 MG tablet Take 1 tablet by mouth every 6 (six) hours as needed for moderate pain. Take 1 tablet by mouth in the evening on Tuesday, Thursday and Saturday. Take 1 tablet by mouth twice daily on all other days. 10/09/18   Ulyses Amor, PA-C  latanoprost (XALATAN) 0.005 % ophthalmic solution Place 1 drop into the left eye at bedtime.     [provider]  midodrine (PROAMATINE) 10 MG tablet Take 10 mg by mouth 2 (two) times daily.    [provider]  montelukast (SINGULAIR) 10 MG tablet Take 1 tablet (10 mg total) by mouth at bedtime. 08/12/18   [name], NP  multivitamin (RENA-VIT) TABS tablet Take 1 tablet by mouth daily.    [provider]  omeprazole (PRILOSEC) 40 MG capsule Take 1 capsule (40 mg total) by mouth daily. 08/28/18   Wendling, Crosby Oyster, DO  TIVICAY 50 MG tablet Take 50 mg by mouth every morning. 01/20/18   [provider]  Vitamin D, Ergocalciferol, (DRISDOL) 50000 UNITS CAPS capsule Take 50,000 Units by mouth every Friday.  04/10/15   [provider]  warfarin (COUMADIN) 5 MG tablet TAKE 1 TO 1 AND 1/2 TABLETS BY MOUTH DAILY AS DIRECTED BY COUMADIN CLINIC 10/21/18   [name], MD    Physical Exam: Vitals:   10/21/18 2240 10/21/18 2241 10/21/18 2302 10/21/18 2307  BP: (!) 136/91 (!) 137/93    Pulse: (!) 106 (!) 106 99   Resp: (!) 30 (!) 27 (!) 32   Temp:    97.6 F (36.4 C)  TempSrc:    Temporal  SpO2: 100% 100% 100%   Weight:      Height:       General:  In no acute respiratory distress.   HEENT:       Eyes: PERRL, EOMI, no scleral icterus.       ENT: No discharge from the ears and nose, no pharynx injection, no tonsillar enlargement.        Neck: No JVD, no bruit, no mass felt. Heme: No neck lymph node  enlargement. Cardiac: S1/S2, RRR, No murmurs, No gallops or rubs. Respiratory: Has crackles bilaterally.   GI: Soft, nondistended, nontender, no rebound pain, no organomegaly, BS present. GU: No hematuria Ext: No pitting leg edema bilaterally. 2+DP/PT pulse bilaterally. Musculoskeletal: No joint deformities, No joint redness or warmth, no limitation of ROM in spin. Skin: No rashes.  Neuro: Alert, oriented X3, cranial nerves II-XII grossly intact, moves all extremities normally.  Psych: Patient is not psychotic, no suicidal or hemocidal ideation.  Labs on Admission: I have personally reviewed following labs and imaging studies  CBC: Recent Labs  Lab 10/21/18 2230 10/21/18 2236  WBC 12.6*  --   HGB 11.5* 13.6  HCT 37.1* 40.0  MCV 111.7*  --   PLT 301  --    Basic Metabolic Panel: Recent Labs  Lab 10/21/18 2230 10/21/18 2236  NA 140 139  K 3.9 3.8  CL 98 99  CO2 25  --   GLUCOSE 230* 225*  BUN 47* 47*  CREATININE 9.89* 10.10*  CALCIUM 9.0  --    GFR: Estimated Creatinine Clearance: 7.2 mL/min (A) (by C-G formula based on SCr of 10.1 mg/dL (H)). Liver Function Tests: Recent Labs  Lab 10/21/18 2230  AST 33  ALT 20  ALKPHOS 108  BILITOT 0.7  PROT 7.6  ALBUMIN 3.9   No results for input(s): LIPASE, AMYLASE in the last 168 hours. No results for input(s): AMMONIA in the last 168 hours. Coagulation Profile: No results for input(s): INR, PROTIME in the last 168 hours. Cardiac Enzymes: No results for input(s): CKTOTAL, CKMB, CKMBINDEX, TROPONINI in the last 168 hours. BNP (last 3 results) Recent Labs    03/11/18 1524  PROBNP 230.0*   HbA1C: No results for input(s): HGBA1C in the last 72 hours. CBG: No results  for input(s): GLUCAP in the last 168 hours. Lipid Profile: No results for input(s): CHOL, HDL, LDLCALC, TRIG, CHOLHDL, LDLDIRECT in the last 72 hours. Thyroid Function Tests: No results for input(s): TSH, T4TOTAL, FREET4, T3FREE, THYROIDAB in the last 72 hours. Anemia Panel: No results for input(s): VITAMINB12, FOLATE, FERRITIN, TIBC, IRON, RETICCTPCT in the last 72 hours. Urine analysis:    Component Value Date/Time   COLORURINE YELLOW 02/17/2018 0540   APPEARANCEUR CLOUDY (A) 02/17/2018 0540   LABSPEC 1.010 02/17/2018 0540   PHURINE 5.0 02/17/2018 0540   GLUCOSEU NEGATIVE 02/17/2018 0540   HGBUR LARGE (A) 02/17/2018 0540   BILIRUBINUR NEGATIVE 02/17/2018 0540   BILIRUBINUR negative 04/16/2013 1447   KETONESUR NEGATIVE 02/17/2018 0540   PROTEINUR 100 (A) 02/17/2018 0540   UROBILINOGEN 0.2 05/11/2014 1352   NITRITE NEGATIVE 02/17/2018 0540   LEUKOCYTESUR NEGATIVE 02/17/2018 0540   Sepsis Labs: @LABRCNTIP (procalcitonin:4,lacticidven:4) )No results found for this or any previous visit (from the past 240 hour(s)).   Radiological Exams on Admission: Dg Chest Port 1 View  Result Date: 10/21/2018 CLINICAL DATA:  Shortness of breath. Dialysis patient. History of congestive heart failure, COPD, end-stage renal disease. EXAM: PORTABLE CHEST 1 VIEW COMPARISON:  04/30/2018 FINDINGS: Right central venous catheter with tip over the cavoatrial junction region. No pneumothorax. Cardiac enlargement. Airspace disease in the right mid and lower lungs. This may represent pneumonia or asymmetrical edema. No consolidation on the left. No blunting of costophrenic angles. No pneumothorax. Mediastinal contours appear intact. Degenerative changes in the spine. IMPRESSION: Airspace disease in the right mid and lower lungs suggesting pneumonia or asymmetrical edema. Cardiac enlargement. Electronically Signed   By: Lucienne Capers M.D.   On: 10/21/2018 22:49  EKG: Independently reviewed.  A. fib, QTc  543, tachycardia, left bundle blockage (old), LAD,   Assessment/Plan Principal Problem:   Pulmonary edema Active Problems:   Acute respiratory failure with hypoxia (HCC)   Chronic obstructive pulmonary disease (HCC)   HIV (human immunodeficiency virus infection) (HCC)   ESRD on dialysis (HCC)   Atrial fibrillation with RVR (HCC)   Chronic anticoagulation   CAD (coronary artery disease)   Gout   Hypertensive emergency   Acute on chronic combined systolic and diastolic CHF (congestive heart failure) (HCC)   Leukocytosis   Acute respiratory failure with hypoxia due to pulmonary edema, acute on chronic combined systolic and diastolic CHF and hypertensive emergency: 2D echo on 02/18/2018 showed EF of 15-20% with grade 1 diastolic dysfunction.  Blood pressure is elevated 202/126-->156/106 with NTG gtt. chest x-ray with pulmonary edema. -place on SDU for obs -Nitroglycerin drip -Continue BiPAP -Blood pressure control - Renal was consulted for urgent dialysis  Hypertensive urgency: -continue Coreg -On nitroglycerin drip -Start amlodipine 10 mg daily  ESRD on dialysis (TTS): -renal will do emergent dialysis as above  HIV (human immunodeficiency virus infection): CD4=380 and VL<20 on 06/19/18 -Continue home HIV medications  Atrial Fibrillation with RVR: CHA2DS2-VASc Score is 7, needs oral anticoagulation. Patient is on Coumadin at home. Heart rate is 110-130s -continue amiodarone, Coreg -Coumadin per pharm  Chronic obstructive pulmonary disease (HCC): -Bronchodilators and Singulair -Mucinex for cough  CAD (coronary artery disease):  No CP -continue coreg  Gout: -continue home allopurinol  Leukocytosis: WBC 12.6.  Chest x-ray with pulmonary edema and possible airspace disease in RML and RLL, but patient does not have fever.  Clinically does not seem to have pneumonia. -will check procalcitonin level, if elevated significantly, we will start antibiotics -Follow-up of blood  culture, urinalysis   DVT ppx: on Coumadin Code Status: Full code Family Communication: None at bed side.     Disposition Plan:  Anticipate discharge back to previous home environment Consults called:  renal Admission status:    SDU/inpation       Date of Service 10/21/2018    Ivor Costa [facility] Pager [phone]  If 7PM-7AM, please contact night-coverage [email or web address] Password [facility] 10/21/2018, 11:29 PM

## 2018-10-21 NOTE — Progress Notes (Signed)
ANTICOAGULATION CONSULT NOTE - Initial Consult  Pharmacy Consult for Coumadin Indication: atrial fibrillation  Allergies  Allergen Reactions  . Cozaar [Losartan Potassium] Other (See Comments)    Causes constipation     Patient Measurements: Height: 6\' 2"  (188 cm) Weight: 198 lb (89.8 kg) IBW/kg (Calculated) : 82.2  Vital Signs: Temp: 97.6 F (36.4 C) (11/18 2307) Temp Source: Temporal (11/18 2307) BP: 137/93 (11/18 2241) Pulse Rate: 99 (11/18 2302)  Labs: Recent Labs    10/21/18 2230 10/21/18 2236  HGB 11.5* 13.6  HCT 37.1* 40.0  PLT 301  --   CREATININE 9.89* 10.10*    Estimated Creatinine Clearance: 7.2 mL/min (A) (by C-G formula based on SCr of 10.1 mg/dL (H)).   Medical History: Past Medical History:  Diagnosis Date  . Acute on chronic systolic and diastolic heart failure, NYHA class 4 ([place])   . Anemia, iron deficiency 11/15/2011  . Arthritis   . Arthritis    "hands, right knee, feet" (02/21/2018)  . Atrial fibrillation ([place])   . Cancer [facility])    hx of prostate; s/p radioactive seed implant 10/2009 Dr Janice Norrie  . Cardiac arrest ([place]) 02/17/2018  . CHF (congestive heart failure) ([place])   . Chronic kidney disease   . Chronic lower back pain   . CKD (chronic kidney disease) stage V requiring chronic dialysis ([place])   . ESRD on dialysis ([place])    started 02/2018  . GERD (gastroesophageal reflux disease)   . Glaucoma   . Gout    daily RX (02/21/2018)  . Heart murmur    "mild" per pt  . Hepatitis    years ago  . History of cardiac cath 2004   negative for CAD  . History of myocardial perfusion scan 02/2010   negative for coronary insufficiency (LVEF 27%)  . HIV (human immunodeficiency virus infection) ([place])   . HIV infection ([place])   . Hyperlipidemia   . Hypertension    followed by [facility] and Vascular (Dr Dani Gobble Croitoru)  . Hypertension   . Nonischemic cardiomyopathy ([place])   . Pneumonia 11/2017  . Prostate cancer ([place])   . Sinus bradycardia    . Sleep apnea    does not use a cpap  . Stroke [facility])    "mini stroke" years ago  . Systolic and diastolic CHF, acute ([place]) 06/2010   felt to be secondary to hypertensive cardiomyopathy    Assessment: 77yo male c/o SOB, admitted for pulmonary edema, to continue Coumadin for Afib.  Goal of Therapy:  INR 2-3   Plan:  Will monitor daily INR for dose adjustments.  Wynona Neat, PharmD, BCPS  10/21/2018,11:30 PM

## 2018-10-22 DIAGNOSIS — J9601 Acute respiratory failure with hypoxia: Secondary | ICD-10-CM | POA: Diagnosis not present

## 2018-10-22 DIAGNOSIS — I4891 Unspecified atrial fibrillation: Secondary | ICD-10-CM | POA: Diagnosis not present

## 2018-10-22 DIAGNOSIS — J810 Acute pulmonary edema: Secondary | ICD-10-CM | POA: Diagnosis not present

## 2018-10-22 DIAGNOSIS — I5043 Acute on chronic combined systolic (congestive) and diastolic (congestive) heart failure: Secondary | ICD-10-CM | POA: Diagnosis not present

## 2018-10-22 LAB — RENAL FUNCTION PANEL
ANION GAP: 12 (ref 5–15)
Albumin: 3.4 g/dL — ABNORMAL LOW (ref 3.5–5.0)
BUN: 27 mg/dL — AB (ref 8–23)
CHLORIDE: 97 mmol/L — AB (ref 98–111)
CO2: 27 mmol/L (ref 22–32)
Calcium: 9.1 mg/dL (ref 8.9–10.3)
Creatinine, Ser: 6.97 mg/dL — ABNORMAL HIGH (ref 0.61–1.24)
GFR calc non Af Amer: 7 mL/min — ABNORMAL LOW (ref >60)
GFR, EST AFRICAN AMERICAN: 8 mL/min — AB (ref >60)
GLUCOSE: 116 mg/dL — AB (ref 70–99)
POTASSIUM: 3.9 mmol/L (ref 3.5–5.1)
Phosphorus: 3.6 mg/dL (ref 2.5–4.6)
SODIUM: 136 mmol/L (ref 135–145)

## 2018-10-22 LAB — CBC
HEMATOCRIT: 37.7 % — AB (ref 39.0–52.0)
HEMATOCRIT: 32.2 % — AB (ref 39.0–52.0)
HEMOGLOBIN: 9.9 g/dL — AB (ref 13.0–17.0)
Hemoglobin: 11.5 g/dL — ABNORMAL LOW (ref 13.0–17.0)
MCH: 33.5 pg (ref 26.0–34.0)
MCH: 33.7 pg (ref 26.0–34.0)
MCHC: 30.5 g/dL (ref 30.0–36.0)
MCHC: 30.7 g/dL (ref 30.0–36.0)
MCV: 109.9 fL — AB (ref 80.0–100.0)
MCV: 109.5 fL — ABNORMAL HIGH (ref 80.0–100.0)
PLATELETS: 195 10*3/uL (ref 150–400)
Platelets: 190 10*3/uL (ref 150–400)
RBC: 3.43 MIL/uL — ABNORMAL LOW (ref 4.22–5.81)
RBC: 2.94 MIL/uL — ABNORMAL LOW (ref 4.22–5.81)
RDW: 14.4 % (ref 11.5–15.5)
RDW: 14.4 % (ref 11.5–15.5)
WBC: 11.9 10*3/uL — ABNORMAL HIGH (ref 4.0–10.5)
WBC: 10.4 10*3/uL (ref 4.0–10.5)
nRBC: 0 % (ref 0.0–0.2)
nRBC: 0 % (ref 0.0–0.2)

## 2018-10-22 LAB — BASIC METABOLIC PANEL
Anion gap: 16 — ABNORMAL HIGH (ref 5–15)
BUN: 23 mg/dL (ref 8–23)
CALCIUM: 9.6 mg/dL (ref 8.9–10.3)
CHLORIDE: 96 mmol/L — AB (ref 98–111)
CO2: 27 mmol/L (ref 22–32)
CREATININE: 6.16 mg/dL — AB (ref 0.61–1.24)
GFR calc non Af Amer: 8 mL/min — ABNORMAL LOW (ref >60)
GFR, EST AFRICAN AMERICAN: 9 mL/min — AB (ref >60)
GLUCOSE: 119 mg/dL — AB (ref 70–99)
Potassium: 3.8 mmol/L (ref 3.5–5.1)
Sodium: 139 mmol/L (ref 135–145)

## 2018-10-22 LAB — LACTIC ACID, PLASMA: Lactic Acid, Venous: 2 mmol/L (ref 0.5–1.9)

## 2018-10-22 LAB — MRSA PCR SCREENING: MRSA by PCR: NEGATIVE

## 2018-10-22 LAB — PROCALCITONIN: Procalcitonin: 8.1 ng/mL

## 2018-10-22 LAB — PROTIME-INR
INR: 1.36
Prothrombin Time: 16.6 seconds — ABNORMAL HIGH (ref 11.4–15.2)

## 2018-10-22 MED ORDER — DORZOLAMIDE HCL-TIMOLOL MAL 2-0.5 % OP SOLN
1.0000 [drp] | Freq: Two times a day (BID) | OPHTHALMIC | Status: DC
  Administered 2018-10-22 – 2018-10-23 (×3): 1 [drp] via OPHTHALMIC
  Filled 2018-10-22: qty 10

## 2018-10-22 MED ORDER — AMLODIPINE BESYLATE 10 MG PO TABS
10.0000 mg | ORAL_TABLET | Freq: Every day | ORAL | Status: DC
  Administered 2018-10-22 – 2018-10-23 (×2): 10 mg via ORAL
  Filled 2018-10-22 (×2): qty 1

## 2018-10-22 MED ORDER — RENA-VITE PO TABS: 1.0000 | ORAL_TABLET | Freq: Every day | ORAL | Status: DC

## 2018-10-22 MED ORDER — WARFARIN SODIUM 7.5 MG PO TABS: 7.5000 mg | ORAL_TABLET | Freq: Once | ORAL | Status: DC

## 2018-10-22 MED ORDER — HEPARIN SODIUM (PORCINE) 1000 UNIT/ML IJ SOLN
INTRAMUSCULAR | Status: AC
  Administered 2018-10-22: 1000 [IU] via INTRAVENOUS_CENTRAL
  Filled 2018-10-22: qty 4

## 2018-10-22 MED ORDER — PANTOPRAZOLE SODIUM 40 MG PO TBEC
40.0000 mg | DELAYED_RELEASE_TABLET | Freq: Every day | ORAL | Status: DC
  Administered 2018-10-22 – 2018-10-23 (×2): 40 mg via ORAL
  Filled 2018-10-22 (×2): qty 1

## 2018-10-22 MED ORDER — HEPARIN SODIUM (PORCINE) 1000 UNIT/ML DIALYSIS
1000.0000 [IU] | INTRAMUSCULAR | Status: DC | PRN
  Administered 2018-10-22 – 2018-10-23 (×2): 1000 [IU] via INTRAVENOUS_CENTRAL
  Filled 2018-10-22 (×3): qty 1

## 2018-10-22 MED ORDER — LIDOCAINE HCL (PF) 1 % IJ SOLN: 5.0000 mL | INTRAMUSCULAR | Status: DC | PRN

## 2018-10-22 MED ORDER — LIDOCAINE-PRILOCAINE 2.5-2.5 % EX CREA: 1.0000 "application " | TOPICAL_CREAM | CUTANEOUS | Status: DC | PRN

## 2018-10-22 MED ORDER — HEPARIN SODIUM (PORCINE) 1000 UNIT/ML DIALYSIS
5800.0000 [IU] | Freq: Once | INTRAMUSCULAR | Status: AC
  Administered 2018-10-23: 5800 [IU] via INTRAVENOUS_CENTRAL
  Filled 2018-10-22: qty 6

## 2018-10-22 MED ORDER — SODIUM CHLORIDE 0.9 % IV SOLN: 100.0000 mL | INTRAVENOUS | Status: DC | PRN

## 2018-10-22 MED ORDER — PENTAFLUOROPROP-TETRAFLUOROETH EX AERO: 1.0000 "application " | INHALATION_SPRAY | CUTANEOUS | Status: DC | PRN

## 2018-10-22 MED ORDER — RENA-VITE PO TABS
1.0000 | ORAL_TABLET | Freq: Every day | ORAL | Status: DC
  Administered 2018-10-22: 1 via ORAL
  Filled 2018-10-22: qty 1

## 2018-10-22 MED ORDER — VITAMIN D (ERGOCALCIFEROL) 1.25 MG (50000 UNIT) PO CAPS: 50000.0000 [IU] | ORAL_CAPSULE | ORAL | Status: DC

## 2018-10-22 MED ORDER — HEPARIN SODIUM (PORCINE) 1000 UNIT/ML DIALYSIS: 1000.0000 [IU] | INTRAMUSCULAR | Status: DC | PRN

## 2018-10-22 MED ORDER — CHLORHEXIDINE GLUCONATE CLOTH 2 % EX PADS
6.0000 | MEDICATED_PAD | Freq: Every day | CUTANEOUS | Status: DC
  Administered 2018-10-22 – 2018-10-23 (×2): 6 via TOPICAL

## 2018-10-22 MED ORDER — LIDOCAINE-PRILOCAINE 2.5-2.5 % EX CREA
1.0000 "application " | TOPICAL_CREAM | CUTANEOUS | Status: DC | PRN
  Filled 2018-10-22: qty 5

## 2018-10-22 MED ORDER — ALLOPURINOL 100 MG PO TABS: 100.0000 mg | ORAL_TABLET | Freq: Every evening | ORAL | Status: DC

## 2018-10-22 MED ORDER — EMTRICITABINE-TENOFOVIR AF 200-25 MG PO TABS
1.0000 | ORAL_TABLET | ORAL | Status: DC
  Administered 2018-10-22 – 2018-10-23 (×2): 1 via ORAL
  Filled 2018-10-22 (×2): qty 1

## 2018-10-22 MED ORDER — BRIMONIDINE TARTRATE 0.15 % OP SOLN
1.0000 [drp] | Freq: Two times a day (BID) | OPHTHALMIC | Status: DC
  Administered 2018-10-22 – 2018-10-23 (×3): 1 [drp] via OPHTHALMIC
  Filled 2018-10-22: qty 5

## 2018-10-22 MED ORDER — ALTEPLASE 2 MG IJ SOLR
2.0000 mg | Freq: Once | INTRAMUSCULAR | Status: DC | PRN
  Filled 2018-10-22: qty 2

## 2018-10-22 MED ORDER — HEPARIN SODIUM (PORCINE) 1000 UNIT/ML DIALYSIS
20.0000 [IU]/kg | INTRAMUSCULAR | Status: DC | PRN
  Administered 2018-10-21: 1800 [IU] via INTRAVENOUS_CENTRAL
  Filled 2018-10-22: qty 2

## 2018-10-22 MED ORDER — HYDROCODONE-ACETAMINOPHEN 5-325 MG PO TABS: 1.0000 | ORAL_TABLET | Freq: Four times a day (QID) | ORAL | Status: DC | PRN

## 2018-10-22 MED ORDER — DOLUTEGRAVIR SODIUM 50 MG PO TABS
50.0000 mg | ORAL_TABLET | ORAL | Status: DC
  Administered 2018-10-22 – 2018-10-23 (×2): 50 mg via ORAL
  Filled 2018-10-22 (×2): qty 1

## 2018-10-22 MED ORDER — LATANOPROST 0.005 % OP SOLN
1.0000 [drp] | Freq: Every day | OPHTHALMIC | Status: DC
  Administered 2018-10-22: 1 [drp] via OPHTHALMIC
  Filled 2018-10-22: qty 2.5

## 2018-10-22 MED ORDER — FERRIC CITRATE 1 GM 210 MG(FE) PO TABS
420.0000 mg | ORAL_TABLET | Freq: Three times a day (TID) | ORAL | Status: DC
Start: 2018-10-22 — End: 2018-10-23
  Administered 2018-10-22 – 2018-10-23 (×5): 420 mg via ORAL
  Filled 2018-10-22 (×6): qty 2

## 2018-10-22 MED ORDER — MONTELUKAST SODIUM 10 MG PO TABS
10.0000 mg | ORAL_TABLET | Freq: Every day | ORAL | Status: DC
  Administered 2018-10-22: 10 mg via ORAL
  Filled 2018-10-22: qty 1

## 2018-10-22 NOTE — Progress Notes (Signed)
[facility] Progress Note   Assessment/ Plan:   Dialysis Orders: [facility] TTS 4 hrs  F180 dialyzer EDW 88 kg, UF profile 2 2K/ 2.0 Ca bath Heparin 5800 u bolus mircera 75 mcg q 2 weeks Calcitriol 0.25 mcg TIW    1. SOB/ pulm edema/ hypoxia: pt has not missed any HD sessions- shortened one session in the past 2 weeks by half an hour, gets to EDW- will need an EDW adjustment upon d/c.  Still has crackles and some signs of vol overload on exam.  Will do another rx today and then will go to regular dialysis tomorrow.  Of note, tried to get extra rx at OP center for this afternoon or tomorrow- all full.  His procalcitonin is elevated but do not clinically suspect PNA- agree with repeat CXR after HD today.  2.  Hypertensive urgency- off nitro gtt, resolved s/p HD.   2.ESRD: TTS.  Has cramping as OP, on UF profile 2, would consider trying UF profile 4.  Post weight 86.3 kg, will probe for EDW.   3. Anemia: Hgb 11.5, ESA gien 11/14 at OP center 4. CKD-MBD: calcitriol 0.25 TIW as OP.  Phoslo and Auryxia listed as binders 5. Nutrition: alb 3.9 6. HIV- home meds per primary 7.  Dispo: OK for d/c after HD today/ early tomorrow AM.  Subjective:    Seen after urgent dialysis ending early this AM.  He is feeling better.  Off O2.     Objective:   BP 119/74 (BP Location: Left Arm)   Pulse 82   Temp 98.4 F (36.9 C) (Oral)   Resp (!) 21   Ht 6\' 2"  (1.88 m)   Wt 85.8 kg   SpO2 95%   BMI 24.29 kg/m   Physical Exam: Gen: older gentleman, NAD CVS: RRR, + s3 Resp: normal WOB but bibasilar crackles 1/3 way up lung fields Abd: soft, nontender, NABS Ext: 1+ LE edema ACCESS: R IJ [facility]  Labs: BMET Recent Labs  Lab 10/21/18 2230 10/21/18 2236 10/22/18 0536  NA 140 139 139  K 3.9 3.8 3.8  CL 98 99 96*  CO2 25  --  27  GLUCOSE 230* 225* 119*  BUN 47* 47* 23  CREATININE 9.89* 10.10* 6.16*  CALCIUM 9.0  --  9.6   CBC Recent Labs  Lab 10/21/18 2230 10/21/18 2236  10/22/18 0536  WBC 12.6*  --  11.9*  HGB 11.5* 13.6 11.5*  HCT 37.1* 40.0 37.7*  MCV 111.7*  --  109.9*  PLT 301  --  195    @IMGRELPRIORS @ Medications:    . allopurinol  100 mg Oral QPM  . amiodarone  200 mg Oral Daily  . amLODipine  10 mg Oral Daily  . brimonidine  1 drop Left Eye BID  . carvedilol  3.125 mg Oral BID WC  . Chlorhexidine Gluconate Cloth  6 each Topical Q0600  . Chlorhexidine Gluconate Cloth  6 each Topical Q0600  . dolutegravir  50 mg Oral BH-q7a  . dorzolamide-timolol  1 drop Left Eye BID  . emtricitabine-tenofovir AF  1 tablet Oral BH-q7a  . ferric citrate  420 mg Oral TID WC  . heparin  5,800 Units Dialysis Once in dialysis  . latanoprost  1 drop Left Eye QHS  . levalbuterol  1.25 mg Nebulization Q6H  . montelukast  10 mg Oral QHS  . multivitamin  1 tablet Oral Daily  . pantoprazole  40 mg Oral Daily  . [START ON 10/25/2018]  Vitamin D (Ergocalciferol)  50,000 Units Oral Q Fri  . warfarin  7.5 mg Oral ONCE-1800  . Warfarin - Pharmacist Dosing Inpatient   Does not apply [place], MD [facility] pgr [phone] 10/22/2018, 11:52 AM

## 2018-10-22 NOTE — Progress Notes (Signed)
HD tx completed @ 0330 w/cramping throughout tx that required UF off/on back and forth frequently, UF off time titrated down to 12 min. Pt stated that's the way his tx goes at his clinic and that he is never really able to get much fluid off b/c of the cramping UF goal not met Blood rinsed back VSS Report called to Regino Schultze, [place]

## 2018-10-22 NOTE — Progress Notes (Signed)
HD tx initiated via HD cath w/o problem Bilat ports: pull/push/flush equally w/o problem VSS Will continue to monitor while on HD tx 

## 2018-10-22 NOTE — Progress Notes (Signed)
PROGRESS NOTE    [facility]  JYN:[id number] DOB: [birth year] DOA: 10/21/2018 PCP: [name], NP  Brief Narrative:David [name] is a 77 y.o. 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 -Admitted overnight 11/18-19, treated with urgent dialysis overnight and nitroglycerin drip   Assessment & Plan:   Acute respiratory failure with hypoxia due to pulmonary edema -Suspect his frequent cramping on HD limits volume removal -Improving, off BiPAP this morning -Status post urgent HD overnight -Unclear what his dry weight is, volume management per renal with HD -2D echo on 02/18/2018 showed EF of 15-20% with grade 1 diastolic dysfunction -next HD per Renal -repeat CXR -Clinically do not suspect pneumonia, suspect his leukocytosis is reactive, blood cultures are negative so far  Hypertensive urgency: -Was on nitroglycerin drip overnight, now off  -Improved with HD  -continue Coreg -Added amlodipine -Volume management with HD  ESRD on dialysis (TTS): -HD as above  HIV (human immunodeficiency virus infection): CD4=380 and VL<20 on 06/19/18 -Continue home HIV medications  Paroxysmal Atrial Fibrillation with RVR:  -CHA2DS2-VASc Score is 7,  on Coumadin at home -continue amiodarone, Coreg -Coumadin per pharmacy INR low, reports that his Coumadin was recently held for AV access procedures and restarted few days back  Chronic obstructive pulmonary disease ([place]): -Bronchodilators and Singulair -Mucinex for cough  CAD (coronary artery disease):  No CP -continue coreg  Gout: -continue home allopurinol  Leukocytosis:  -Suspect this is reactive -Follow-up blood cultures he has a right IJ TDC  DVT ppx: on Coumadin, INR subtherapeutic Code  Status: Full code Family Communication: None at bed side.     Disposition Plan:   Home when cleared by nephrology   Consultants:   Renal   Procedures:   Antimicrobials:    Subjective: -feels better, reports that his dialysis is limited by cramping  Objective: Vitals:   10/22/18 0744 10/22/18 0800 10/22/18 0825 10/22/18 0900  BP:   (!) 143/81   Pulse:  82 89 85  Resp:  20  18  Temp:      TempSrc:      SpO2: 98% 92%  93%  Weight:      Height:        Intake/Output Summary (Last 24 hours) at 10/22/2018 1013 Last data filed at 10/22/2018 0910 Gross per 24 hour  Intake 120 ml  Output 3462 ml  Net -3342 ml   Filed Weights   10/21/18 2315 10/22/18 0337 10/22/18 0431  Weight: 89.8 kg 86.3 kg 85.8 kg    Examination:  General exam: Appears calm and comfortable, no distress Respiratory system: Decreased breath sounds at both bases, rest clear, right IJ HD catheter noted Cardiovascular system: S1 & S2 heard, RRR Gastrointestinal system: Abdomen is nondistended, soft and nontender.Normal bowel sounds heard. Central nervous system: Alert and oriented. No focal neurological deficits. Extremities: trace edema Skin: No rashes, lesions or ulcers Psychiatry: Judgement and insight appear normal. Mood & affect appropriate.     Data Reviewed:   CBC: Recent Labs  Lab 10/21/18 2230 10/21/18 2236 10/22/18 0536  WBC 12.6*  --  11.9*  HGB 11.5* 13.6 11.5*  HCT 37.1* 40.0 37.7*  MCV 111.7*  --  109.9*  PLT 301  --  865   Basic Metabolic Panel: Recent Labs  Lab 10/21/18 2230 10/21/18 2236 10/22/18 0536  NA  140 139 139  K 3.9 3.8 3.8  CL 98 99 96*  CO2 25  --  27  GLUCOSE 230* 225* 119*  BUN 47* 47* 23  CREATININE 9.89* 10.10* 6.16*  CALCIUM 9.0  --  9.6   GFR: Estimated Creatinine Clearance: 11.9 mL/min (A) (by C-G formula based on SCr of 6.16 mg/dL (H)). Liver Function Tests: Recent Labs  Lab 10/21/18 2230  AST 33  ALT 20  ALKPHOS 108  BILITOT 0.7    PROT 7.6  ALBUMIN 3.9   No results for input(s): LIPASE, AMYLASE in the last 168 hours. No results for input(s): AMMONIA in the last 168 hours. Coagulation Profile: Recent Labs  Lab 10/22/18 0536  INR 1.36   Cardiac Enzymes: No results for input(s): CKTOTAL, CKMB, CKMBINDEX, TROPONINI in the last 168 hours. BNP (last 3 results) Recent Labs    03/11/18 1524  PROBNP 230.0*   HbA1C: No results for input(s): HGBA1C in the last 72 hours. CBG: No results for input(s): GLUCAP in the last 168 hours. Lipid Profile: No results for input(s): CHOL, HDL, LDLCALC, TRIG, CHOLHDL, LDLDIRECT in the last 72 hours. Thyroid Function Tests: No results for input(s): TSH, T4TOTAL, FREET4, T3FREE, THYROIDAB in the last 72 hours. Anemia Panel: No results for input(s): VITAMINB12, FOLATE, FERRITIN, TIBC, IRON, RETICCTPCT in the last 72 hours. Urine analysis:    Component Value Date/Time   COLORURINE YELLOW 02/17/2018 0540   APPEARANCEUR CLOUDY (A) 02/17/2018 0540   LABSPEC 1.010 02/17/2018 0540   PHURINE 5.0 02/17/2018 0540   GLUCOSEU NEGATIVE 02/17/2018 0540   HGBUR LARGE (A) 02/17/2018 0540   BILIRUBINUR NEGATIVE 02/17/2018 0540   BILIRUBINUR negative 04/16/2013 1447   KETONESUR NEGATIVE 02/17/2018 0540   PROTEINUR 100 (A) 02/17/2018 0540   UROBILINOGEN 0.2 05/11/2014 1352   NITRITE NEGATIVE 02/17/2018 0540   LEUKOCYTESUR NEGATIVE 02/17/2018 0540   Sepsis Labs: @LABRCNTIP (procalcitonin:4,lacticidven:4)  ) Recent Results (from the past 240 hour(s))  MRSA PCR Screening     Status: None   Collection Time: 10/22/18  4:17 AM  Result Value Ref Range Status   MRSA by PCR NEGATIVE NEGATIVE Final    Comment:        The GeneXpert MRSA Assay (FDA approved for NASAL specimens only), is one component of a comprehensive MRSA colonization surveillance program. It is not intended to diagnose MRSA infection nor to guide or monitor treatment for MRSA infections. Performed at [facility], [place] [address] [place], [place] [address]   Culture, blood (Routine X 2) w Reflex to ID Panel     Status: None (Preliminary result)   Collection Time: 10/22/18  5:36 AM  Result Value Ref Range Status   Specimen Description BLOOD BLOOD LEFT HAND  Final   Special Requests   Final    Blood Culture adequate volume Performed at [facility] [address], [place], [place] [address]    Culture PENDING  Incomplete   Report Status PENDING  Incomplete         Radiology Studies: Dg Chest Port 1 View  Result Date: 10/21/2018 CLINICAL DATA:  Shortness of breath. Dialysis patient. History of congestive heart failure, COPD, end-stage renal disease. EXAM: PORTABLE CHEST 1 VIEW COMPARISON:  04/30/2018 FINDINGS: Right central venous catheter with tip over the cavoatrial junction region. No pneumothorax. Cardiac enlargement. Airspace disease in the right mid and lower lungs. This may represent pneumonia or asymmetrical edema. No consolidation on the left. No blunting of costophrenic angles. No pneumothorax.  Mediastinal contours appear intact. Degenerative changes in the spine. IMPRESSION: Airspace disease in the right mid and lower lungs suggesting pneumonia or asymmetrical edema. Cardiac enlargement. Electronically Signed   By: Lucienne Capers M.D.   On: 10/21/2018 22:49        Scheduled Meds: . allopurinol  100 mg Oral QPM  . amiodarone  200 mg Oral Daily  . amLODipine  10 mg Oral Daily  . brimonidine  1 drop Left Eye BID  . carvedilol  3.125 mg Oral BID WC  . Chlorhexidine Gluconate Cloth  6 each Topical Q0600  . dolutegravir  50 mg Oral BH-q7a  . dorzolamide-timolol  1 drop Left Eye BID  . emtricitabine-tenofovir AF  1 tablet Oral BH-q7a  . ferric citrate  420 mg Oral TID WC  . latanoprost  1 drop Left Eye QHS  . levalbuterol  1.25 mg Nebulization Q6H  . montelukast  10 mg Oral QHS  . multivitamin  1 tablet Oral Daily  . pantoprazole  40 mg Oral Daily  . [START ON  10/25/2018] Vitamin D (Ergocalciferol)  50,000 Units Oral Q Fri  . Warfarin - Pharmacist Dosing Inpatient   Does not apply q1800   Continuous Infusions: . sodium chloride    . sodium chloride       LOS: 0 days    Time spent: 47min    Domenic Polite, MD [facility] Page via [email or web address], password [email or web address] After 7PM please contact night-coverage  10/22/2018, 10:13 AM

## 2018-10-22 NOTE — Progress Notes (Signed)
ANTICOAGULATION CONSULT NOTE - Follow up [place] for Coumadin Indication: atrial fibrillation  Allergies  Allergen Reactions  . Cozaar [Losartan Potassium] Other (See Comments)    Causes constipation     Patient Measurements: Height: 6\' 2"  (188 cm) Weight: 189 lb 2.5 oz (85.8 kg) IBW/kg (Calculated) : 82.2  Vital Signs: Temp: 98.6 F (37 C) (11/19 0714) Temp Source: Axillary (11/19 0714) BP: 143/81 (11/19 0825) Pulse Rate: 89 (11/19 0825)  Labs: Recent Labs    10/21/18 2230 10/21/18 2236 10/22/18 0536  HGB 11.5* 13.6 11.5*  HCT 37.1* 40.0 37.7*  PLT 301  --  195  LABPROT  --   --  16.6*  INR  --   --  1.36  CREATININE 9.89* 10.10* 6.16*    Estimated Creatinine Clearance: 11.9 mL/min (A) (by C-G formula based on SCr of 6.16 mg/dL (H)).   Medical History: Past Medical History:  Diagnosis Date  . Acute on chronic systolic and diastolic heart failure, NYHA class 4 ([place])   . Anemia, iron deficiency 11/15/2011  . Arthritis   . Arthritis    "hands, right knee, feet" (02/21/2018)  . Atrial fibrillation ([place])   . Cancer [name])    hx of prostate; s/p radioactive seed implant 10/2009 Dr Janice Norrie  . Cardiac arrest ([place]) 02/17/2018  . CHF (congestive heart failure) ([place])   . Chronic kidney disease   . Chronic lower back pain   . CKD (chronic kidney disease) stage V requiring chronic dialysis ([place])   . ESRD on dialysis ([place])    started 02/2018  . GERD (gastroesophageal reflux disease)   . Glaucoma   . Gout    daily RX (02/21/2018)  . Heart murmur    "mild" per pt  . Hepatitis    years ago  . History of cardiac cath 2004   negative for CAD  . History of myocardial perfusion scan 02/2010   negative for coronary insufficiency (LVEF 27%)  . HIV (human immunodeficiency virus infection) ([place])   . HIV infection ([place])   . Hyperlipidemia   . Hypertension    followed by [facility] (Dr Dani Gobble Croitoru)  . Hypertension   . Nonischemic  cardiomyopathy ([place])   . Pneumonia 11/2017  . Prostate cancer ([place])   . Sinus bradycardia   . Sleep apnea    does not use a cpap  . Stroke [facility])    "mini stroke" years ago  . Systolic and diastolic CHF, acute ([place]) 06/2010   felt to be secondary to hypertensive cardiomyopathy    Assessment: 77yo male presenting with shortness of breath, admitted with pulmonary edema and hypertensive emergency. Patient has history of atrial fibrillation, taking warfarin (per 11/4 anticoag visit note: home dose 5mg  every day except 7.5mg  every Friday, last PTA dose unknown). Pharmacy has been consulted to dose warfarin.  INR this morning subtherapeutic at 1.36. CBC down from previous day with Hgb 11.5 and platelets 195. No documented bleeding. As INR subtherapeutic, will redose warfarin at 1.5 times home dose for tonight then reassess.  Goal of Therapy:  INR 2-3    Plan:  Warfarin 7.5mg  x1 Monitor daily INR, CBC Monitor for s/sx bleeding   Brendolyn Patty, PharmD PGY1 Pharmacy Resident Phone [phone]  10/22/2018   9:07 AM

## 2018-10-22 NOTE — ED Provider Notes (Signed)
I saw and evaluated the patient, reviewed the resident's note and I agree with the findings and plan with the following exceptions.   77 year old male on dialysis last treatment was on Saturday told EMS severity respiratory distress.  On their arrival patient had oxygen saturations of 60% and bilateral crackles throughout lung fields.  Improved to high 80s on a nonrebreather however was working very hard to recent start of BiPAP. Patient is in significant respiratory distress on our examination.  Consistent with pulmonary edema with a hypertension, tachycardia tachypnea and bilateral rales.  He does make urine 3 times a day on average so continued BiPAP and started on nitroglycerin drip.  He does not have any chest pain so they this is unlikely to be ACS related.  I discussed the case with Dr. Carolin Sicks with nephrology he will set up emergent dialysis.  Discussed case with Dr. Blaine Hamper, hospitalist, who will admit subsequently.  CRITICAL CARE Performed by: [name] Total critical care time: 35 minutes Critical care time was exclusive of separately billable procedures and treating other patients. Critical care was necessary to treat or prevent imminent or life-threatening deterioration. Critical care was time spent personally by me on the following activities: development of treatment plan with patient and/or surrogate as well as nursing, discussions with consultants, evaluation of patient's response to treatment, examination of patient, obtaining history from patient or surrogate, ordering and performing treatments and interventions, ordering and review of laboratory studies, ordering and review of radiographic studies, pulse oximetry and re-evaluation of patient's condition.    EKG Interpretation  Date/Time:  Monday October 21 2018 22:15:41 EST Ventricular Rate:  130 PR Interval:    QRS Duration: 139 QT Interval:  369 QTC Calculation: 543 R Axis:   -58 Text Interpretation:  Sinus tachycardia  Prolonged PR interval Consider right atrial enlargement Left bundle branch block When compared with ECG of 04/30/2018, HEART RATE has increased Confirmed by Delora Fuel ([id number]) on 10/21/2018 11:00:23 PM         [name], Corene Cornea, MD 10/28/18 [phone]

## 2018-10-22 NOTE — ED Provider Notes (Signed)
[place] [facility] WEST PROGRESSIVE CARE Provider Note   CSN: [id number] Arrival date & time: 10/21/18  2212     History   Chief Complaint Chief Complaint  Patient presents with  . Respiratory Distress    HPI David [name] is a 77 y.o. male.  The history is provided by the patient and the EMS personnel. No language interpreter was used.  Shortness of Breath  This is a new problem. The problem occurs continuously.The current episode started less than 1 hour ago. The problem has been rapidly worsening. Associated symptoms include sputum production and wheezing. Pertinent negatives include no fever, no headaches, no rhinorrhea, no sore throat, no orthopnea, no chest pain, no vomiting, no abdominal pain, no leg pain and no claudication. Treatments tried: placed on CPAP in route. The treatment provided moderate relief. He has had prior hospitalizations. He has had prior ED visits. Associated medical issues comments: ESRD on Dialysis.    Past Medical History:  Diagnosis Date  . Acute on chronic systolic and diastolic heart failure, NYHA class 4 ([place])   . Anemia, iron deficiency 11/15/2011  . Arthritis   . Arthritis    "hands, right knee, feet" (02/21/2018)  . Atrial fibrillation ([place])   . Cancer [facility])    hx of prostate; s/p radioactive seed implant 10/2009 Dr Janice Norrie  . Cardiac arrest ([place]) 02/17/2018  . CHF (congestive heart failure) ([place])   . Chronic kidney disease   . Chronic lower back pain   . CKD (chronic kidney disease) stage V requiring chronic dialysis ([place])   . ESRD on dialysis ([place])    started 02/2018  . GERD (gastroesophageal reflux disease)   . Glaucoma   . Gout    daily RX (02/21/2018)  . Heart murmur    "mild" per pt  . Hepatitis    years ago  . History of cardiac cath 2004   negative for CAD  . History of myocardial perfusion scan 02/2010   negative for coronary insufficiency (LVEF 27%)  . HIV (human immunodeficiency virus infection) ([place])   . HIV infection  ([place])   . Hyperlipidemia   . Hypertension    followed by [facility] Vascular (Dr Dani Gobble Croitoru)  . Hypertension   . Nonischemic cardiomyopathy ([place])   . Pneumonia 11/2017  . Prostate cancer ([place])   . Sinus bradycardia   . Sleep apnea    does not use a cpap  . Stroke [facility])    "mini stroke" years ago  . Systolic and diastolic CHF, acute ([place]) 06/2010   felt to be secondary to hypertensive cardiomyopathy    Patient Active Problem List   Diagnosis Date Noted  . Pulmonary edema 10/21/2018  . Gout 10/21/2018  . Hypertensive emergency 10/21/2018  . Acute on chronic combined systolic and diastolic CHF (congestive heart failure) ([place]) 10/21/2018  . Leukocytosis 10/21/2018  . Gastroesophageal reflux disease 08/28/2018  . Chronic anticoagulation 04/03/2018  . CAD (coronary artery disease) 04/03/2018  . Encounter for therapeutic drug monitoring 03/27/2018  . Atrial fibrillation with RVR ([place]) 03/25/2018  . Palliative care by specialist   . Advance care planning   . Goals of care, counseling/discussion   . Cardiac arrest ([place]) 02/17/2018  . Respiratory arrest ([place])   . Acute on chronic systolic heart failure ([place])   . Chronic obstructive pulmonary disease ([place])   . HIV (human immunodeficiency virus infection) ([place])   . ESRD on dialysis ([place])   . Acute respiratory failure with hypoxia ([place]) 01/21/2018  .  CAP (community acquired pneumonia)   . Aspiration pneumonia of both lower lobes due to regurgitated food ([place])   . Sepsis due to pneumonia ([place]) 11/22/2017  . AIDS ([place]) 05/14/2014  . 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 ARF (acute renal failure) ([place]) 05/06/2014  . Gastroparesis 05/06/2014  . Protein-calorie malnutrition, severe ([place]) 05/05/2014  . CKD (chronic kidney disease) stage 4, GFR 15-29 ml/min (HCC) 05/02/2014  . Hypotension 05/02/2014  . Loss of weight 04/29/2014  . Conjunctivitis 04/29/2014  . 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 Low back pain  04/09/2012  . Osteoarthritis of hip 12/29/2011  . Iron deficiency anemia 06/28/2011  . Macrocytic anemia 04/02/2011  . 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 Hyperlipidemia 11/30/2010  . Transient cerebral ischemia 11/30/2010  . HYPERGLYCEMIA 11/30/2010    Past Surgical History:  Procedure Laterality Date  . AV FISTULA PLACEMENT Right 10/13/2016   Procedure: ARTERIOVENOUS (AV) FISTULA CREATION;  Surgeon: [name], MD;  Location: [facility];  Service: Vascular;  Laterality: Right;  . AV FISTULA PLACEMENT Right 10/13/2016   [name] [phone]  . AV FISTULA PLACEMENT Left 02/25/2018   Procedure: INSERTION OF ARTERIOVENOUS (AV) GORE-TEX GRAFT LEFT UPPER ARM;  Surgeon: Angelia Mould, MD;  Location: [facility];  Service: Vascular;  Laterality: Left;  . AV FISTULA PLACEMENT Right 08/07/2018   Procedure: Creation of right arm brachiocephalic Fistula;  Surgeon: Waynetta Sandy, MD;  Location: [place];  Service: Vascular;  Laterality: Right;  . BASCILIC VEIN TRANSPOSITION Left 06/24/2015   Procedure: BASILIC VEIN TRANSPOSITION;  Surgeon: Mal Misty, MD;  Location: [place];  Service: Vascular;  Laterality: Left;  . BASCILIC VEIN TRANSPOSITION Left 2/97/9892 Bascilic vein transposition (Left)   [name] [id number]  . CARDIAC CATHETERIZATION  02/04/2003   mildly depressed LV systolic fx EF 14%,GYJEHU coronaries/abdominal aorta/renal arteries.  Marland Kitchen CARDIAC CATHETERIZATION  2004  . CATARACT EXTRACTION, BILATERAL Bilateral   . COLONOSCOPY    . ESOPHAGOGASTRODUODENOSCOPY (EGD) WITH PROPOFOL N/A 05/05/2014   Procedure: ESOPHAGOGASTRODUODENOSCOPY (EGD) WITH PROPOFOL;  Surgeon: Missy Sabins, MD;  [facility];  Service: Endoscopy;  Laterality: N/A;  . EYE SURGERY Bilateral    cataract surgery   . EYE SURGERY Bilateral    glaucoma surgery  . EYE SURGERY    . GLAUCOMA SURGERY Bilateral   . INSERTION OF ARTERIOVENOUS (AV) ARTEGRAFT ARM  10/09/2018   Procedure: INSERTION OF  ARTERIOVENOUS (AV) 51mm x 41cm ARTEGRAFT LEFT UPPER ARM;  Surgeon: Waynetta Sandy, MD;  Location: [place];  Service: Vascular;;  . INSERTION PROSTATE RADIATION SEED    . IR FLUORO GUIDE CV LINE RIGHT  02/18/2018  . IR US GUIDE VASC ACCESS RIGHT  02/18/2018  . LEFT HEART CATH AND CORONARY ANGIOGRAPHY N/A 02/20/2018   Procedure: LEFT HEART CATH AND CORONARY ANGIOGRAPHY;  Surgeon: Jettie Booze, MD;  Location: [facility] CV LAB;  Service: Cardiovascular;  Laterality: N/A;  . NM MYOCAR PERF WALL MOTION  02/21/2010   normal  . RADIOACTIVE SEED IMPLANT  2010   prostate cancer  . THROMBECTOMY W/ EMBOLECTOMY Left 02/26/2018   Procedure: THROMBECTOMY ARTERIOVENOUS GRAFT;  Surgeon: Angelia Mould, MD;  Location: [facility];  Service: Vascular;  Laterality: Left;  . ULTRASOUND GUIDANCE FOR VASCULAR ACCESS  02/20/2018   Procedure: Ultrasound Guidance For Vascular Access;  Surgeon: Jettie Booze, MD;  Location: [facility] CV LAB;  Service: Cardiovascular;;  . UPPER EXTREMITY VENOGRAPHY N/A 07/08/2018   Procedure: UPPER EXTREMITY VENOGRAPHY;  Surgeon:  Waynetta Sandy, MD;  Location: [facility];  Service: Cardiovascular;  Laterality: N/A;  Bilateral  . US ECHOCARDIOGRAPHY  02/06/2012   mild LVH,LA mod. dilated,mild-mod. 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 AI.        Home Medications    Prior to Admission medications   Medication Sig Start Date End Date Taking? Authorizing Provider  allopurinol (ZYLOPRIM) 100 MG tablet Take 100 mg by mouth every evening.  06/27/18   [provider]  amiodarone (PACERONE) 200 MG tablet TAKE 1 TABLET(200 MG) BY MOUTH DAILY Patient taking differently: Take 200 mg by mouth daily.  06/05/18   Croitoru, Mihai, MD  AURYXIA 1 GM 210 MG(Fe) tablet Take 420 mg by mouth 3 (three) times daily with meals.  08/05/18   [provider]  brimonidine (ALPHAGAN) 0.15 % ophthalmic solution Place 1 drop into the  left eye 2 (two) times daily. 11/15/17   [provider]  carvedilol (COREG) 3.125 MG tablet Take 1 tablet (3.125 mg total) by mouth 2 (two) times daily. 06/10/18 06/05/19  Croitoru, Mihai, MD  DESCOVY 200-25 MG tablet Take 1 tablet by mouth every morning. 01/20/18   [provider]  dorzolamide-timolol (COSOPT) 22.3-6.8 MG/ML ophthalmic solution Place 1 drop into the left eye 2 (two) times daily. 12/17/17   [provider]  HYDROcodone-acetaminophen (NORCO/VICODIN) 5-325 MG tablet Take 1 tablet by mouth every 6 (six) hours as needed for moderate pain. Take 1 tablet by mouth in the evening on Tuesday, Thursday and Saturday. Take 1 tablet by mouth twice daily on all other days. 10/09/18   Ulyses Amor, PA-C  latanoprost (XALATAN) 0.005 % ophthalmic solution Place 1 drop into the left eye at bedtime.     [provider]  midodrine (PROAMATINE) 10 MG tablet Take 10 mg by mouth 2 (two) times daily.    [provider]  montelukast (SINGULAIR) 10 MG tablet Take 1 tablet (10 mg total) by mouth at bedtime. 08/12/18   [name], NP  multivitamin (RENA-VIT) TABS tablet Take 1 tablet by mouth daily.    [provider]  omeprazole (PRILOSEC) 40 MG capsule Take 1 capsule (40 mg total) by mouth daily. 08/28/18   Wendling, Crosby Oyster, DO  TIVICAY 50 MG tablet Take 50 mg by mouth every morning. 01/20/18   [provider]  Vitamin D, Ergocalciferol, (DRISDOL) 50000 UNITS CAPS capsule Take 50,000 Units by mouth every Friday.  04/10/15   [provider]  warfarin (COUMADIN) 5 MG tablet TAKE 1 TO 1 AND 1/2 TABLETS BY MOUTH DAILY AS DIRECTED BY COUMADIN CLINIC 10/21/18   Croitoru, Dani Gobble, MD    Family History Family History  Problem Relation Age of Onset  . Hypertension Mother   . Thyroid disease Mother   . Cholelithiasis Daughter   . Cholelithiasis Son   . Hypertension Maternal Grandmother   . Diabetes Maternal Grandmother   . Heart attack  Neg Hx   . Hyperlipidemia Neg Hx     Social History Social History   Tobacco Use  . Smoking status: Former Smoker    Packs/day: 1.00    Years: 30.00    Pack years: 30.00    Types: Cigarettes    Last attempt to quit: 2006    Years since quitting: 13.8  . Smokeless tobacco: Never Used  Substance Use Topics  . Alcohol use: Yes    Comment: once a week-wine  . Drug use: Never  Allergies   Cozaar [losartan potassium]   Review of Systems Review of Systems  Unable to perform ROS: Acuity of condition  Constitutional: Negative for fever.  HENT: Negative for rhinorrhea and sore throat.   Respiratory: Positive for sputum production, shortness of breath and wheezing.   Cardiovascular: Negative for chest pain, orthopnea and claudication.  Gastrointestinal: Negative for abdominal pain and vomiting.  Neurological: Negative for headaches.     Physical Exam Updated Vital Signs BP 119/74 (BP Location: Left Arm)   Pulse 82   Temp 98.4 F (36.9 C) (Oral)   Resp (!) 21   Ht 6\' 2"  (1.88 m)   Wt 85.8 kg   SpO2 95%   BMI 24.29 kg/m   Physical Exam  Constitutional: He appears well-developed and well-nourished.  HENT:  Head: Normocephalic and atraumatic.  Eyes: Conjunctivae are normal.  Neck: Neck supple.  Cardiovascular: Normal rate and regular rhythm.  No murmur heard. Pulmonary/Chest: Accessory muscle usage present. No stridor. Tachypnea noted. He is in respiratory distress. He has decreased breath sounds. He has rales.  Abdominal: Soft. There is no tenderness.  Musculoskeletal: He exhibits no edema.  Neurological: He is alert.  Skin: Skin is warm and dry.  Psychiatric: He has a normal mood and affect.  Nursing note and vitals reviewed.  ED Treatments / Results  Labs (all labs ordered are listed, but only abnormal results are displayed) Labs Reviewed  CBC - Abnormal; Notable for the following components:      Result Value   WBC 12.6 (*)    RBC 3.32 (*)     Hemoglobin 11.5 (*)    HCT 37.1 (*)    MCV 111.7 (*)    MCH 34.6 (*)    All other components within normal limits  COMPREHENSIVE METABOLIC PANEL - Abnormal; Notable for the following components:   Glucose, Bld 230 (*)    BUN 47 (*)    Creatinine, Ser 9.89 (*)    GFR calc non Af Amer 4 (*)    GFR calc Af Amer 5 (*)    Anion gap 17 (*)    All other components within normal limits  BASIC METABOLIC PANEL - Abnormal; Notable for the following components:   Chloride 96 (*)    Glucose, Bld 119 (*)    Creatinine, Ser 6.16 (*)    GFR calc non Af Amer 8 (*)    GFR calc Af Amer 9 (*)    Anion gap 16 (*)    All other components within normal limits  CBC - Abnormal; Notable for the following components:   WBC 11.9 (*)    RBC 3.43 (*)    Hemoglobin 11.5 (*)    HCT 37.7 (*)    MCV 109.9 (*)    All other components within normal limits  LACTIC ACID, PLASMA - Abnormal; Notable for the following components:   Lactic Acid, Venous 2.0 (*)    All other components within normal limits  PROTIME-INR - Abnormal; Notable for the following components:   Prothrombin Time 16.6 (*)    All other components within normal limits  I-STAT CHEM 8, ED - Abnormal; Notable for the following components:   BUN 47 (*)    Creatinine, Ser 10.10 (*)    Glucose, Bld 225 (*)    Calcium, Ion 1.07 (*)    All other components within normal limits  CULTURE, BLOOD (ROUTINE X 2)  MRSA PCR SCREENING  CULTURE, BLOOD (ROUTINE X 2)  PROCALCITONIN  URINALYSIS, ROUTINE  W REFLEX MICROSCOPIC  RENAL FUNCTION PANEL  CBC  I-STAT TROPONIN, ED    EKG EKG Interpretation  Date/Time:  Monday October 21 2018 22:15:41 EST Ventricular Rate:  130 PR Interval:    QRS Duration: 139 QT Interval:  369 QTC Calculation: 543 R Axis:   -58 Text Interpretation:  Sinus tachycardia Prolonged PR interval Consider right atrial enlargement Left bundle branch block When compared with ECG of 04/30/2018, HEART RATE has increased Confirmed by  Delora Fuel ([id number]) on 10/21/2018 11:00:23 PM   Radiology Dg Chest Port 1 View  Result Date: 10/21/2018 CLINICAL DATA:  Shortness of breath. Dialysis patient. History of congestive heart failure, COPD, end-stage renal disease. EXAM: PORTABLE CHEST 1 VIEW COMPARISON:  04/30/2018 FINDINGS: Right central venous catheter with tip over the cavoatrial junction region. No pneumothorax. Cardiac enlargement. Airspace disease in the right mid and lower lungs. This may represent pneumonia or asymmetrical edema. No consolidation on the left. No blunting of costophrenic angles. No pneumothorax. Mediastinal contours appear intact. Degenerative changes in the spine. IMPRESSION: Airspace disease in the right mid and lower lungs suggesting pneumonia or asymmetrical edema. Cardiac enlargement. Electronically Signed   By: Lucienne Capers M.D.   On: 10/21/2018 22:49    Procedures Procedures (including critical care time)  Medications Ordered in ED Medications  Chlorhexidine Gluconate Cloth 2 % PADS 6 each (has no administration in time range)  pentafluoroprop-tetrafluoroeth (GEBAUERS) aerosol 1 application (has no administration in time range)  lidocaine (PF) (XYLOCAINE) 1 % injection 5 mL (has no administration in time range)  lidocaine-prilocaine (EMLA) cream 1 application (has no administration in time range)  0.9 %  sodium chloride infusion (has no administration in time range)  0.9 %  sodium chloride infusion (has no administration in time range)  heparin injection 1,000 Units (1,000 Units Dialysis Given 10/22/18 0335)  alteplase (CATHFLO ACTIVASE) injection 2 mg (has no administration in time range)  heparin injection 1,800 Units (1,800 Units Dialysis Given 10/21/18 2325)  amiodarone (PACERONE) tablet 200 mg (200 mg Oral Given 10/22/18 0828)  carvedilol (COREG) tablet 3.125 mg (3.125 mg Oral Given 10/22/18 0827)  hydrALAZINE (APRESOLINE) injection 5 mg (has no administration in time range)    acetaminophen (TYLENOL) tablet 650 mg (has no administration in time range)    Or  acetaminophen (TYLENOL) suppository 650 mg (has no administration in time range)  senna-docusate (Senokot-S) tablet 1 tablet (has no administration in time range)  hydrOXYzine (ATARAX/VISTARIL) tablet 10 mg (has no administration in time range)  zolpidem (AMBIEN) tablet 5 mg (has no administration in time range)  levalbuterol (XOPENEX) nebulizer solution 1.25 mg (1.25 mg Nebulization Given 10/22/18 0742)  Warfarin - Pharmacist Dosing Inpatient (has no administration in time range)  allopurinol (ZYLOPRIM) tablet 100 mg (has no administration in time range)  HYDROcodone-acetaminophen (NORCO/VICODIN) 5-325 MG per tablet 1 tablet (has no administration in time range)  emtricitabine-tenofovir AF (DESCOVY) 200-25 MG per tablet 1 tablet (1 tablet Oral Given 10/22/18 0830)  dolutegravir (TIVICAY) tablet 50 mg (50 mg Oral Given 10/22/18 0825)  ferric citrate (AURYXIA) tablet 420 mg (420 mg Oral Given 10/22/18 0829)  pantoprazole (PROTONIX) EC tablet 40 mg (40 mg Oral Given 10/22/18 0829)  multivitamin (RENA-VIT) tablet 1 tablet (1 tablet Oral Given 10/22/18 0829)  Vitamin D (Ergocalciferol) (DRISDOL) capsule 50,000 Units (has no administration in time range)  montelukast (SINGULAIR) tablet 10 mg (has no administration in time range)  brimonidine (ALPHAGAN) 0.15 % ophthalmic solution 1 drop (1 drop Left Eye Given 10/22/18  1962)  dorzolamide-timolol (COSOPT) 22.3-6.8 MG/ML ophthalmic solution 1 drop (1 drop Left Eye Given 10/22/18 0846)  latanoprost (XALATAN) 0.005 % ophthalmic solution 1 drop (has no administration in time range)  amLODipine (NORVASC) tablet 10 mg (10 mg Oral Given 10/22/18 0825)  warfarin (COUMADIN) tablet 7.5 mg (has no administration in time range)  Chlorhexidine Gluconate Cloth 2 % PADS 6 each (has no administration in time range)  0.9 %  sodium chloride infusion (has no administration in time  range)  0.9 %  sodium chloride infusion (has no administration in time range)  heparin injection 1,000 Units (has no administration in time range)  alteplase (CATHFLO ACTIVASE) injection 2 mg (has no administration in time range)  heparin injection 5,800 Units (has no administration in time range)     Initial Impression / Assessment and Plan / ED Course  I have reviewed the triage vital signs and the nursing notes.  Pertinent labs & imaging results that were available during my care of the patient were reviewed by me and considered in my medical decision making (see chart for details).     Patient is a 77 y.o. male with PMHx of ESRD on dialysis last session on Saturday presenting with EMS for acute onset shortness of breath. Patient arrived hypoxic and on CPAP in respiratory distress. RT was called to bedside, patient was placed on BiPAP with good response of SpO2 saturations and respiratory effort. BP elevated SBP > 200. PE shows rales and fluid overload on lung exam. Patient is in flash pulmonary edema. Nitroglycerin gtt was started at 100 mcg/hr with good response. BP decreased, respiratory effort got better and patient was able to breath better. Nephrology was consulted for emergent dialysis, patient was taken from the ED. Patient will be admitted to the hospitalist after dialysis for further evaluation and management.   Final Clinical Impressions(s) / ED Diagnoses   Final diagnoses:  Hypoxia  Acute pulmonary edema [facility])    ED Discharge Orders    None       Erskine Squibb, MD 10/22/18 1214    Mesner, Corene Cornea, MD 10/28/18 2297    [name], MD 11/01/18 [phone]

## 2018-10-22 NOTE — Progress Notes (Signed)
Patient transported from dialysis to 2W04 via BIPAP without incidence.

## 2018-10-23 DIAGNOSIS — I5043 Acute on chronic combined systolic (congestive) and diastolic (congestive) heart failure: Secondary | ICD-10-CM | POA: Diagnosis not present

## 2018-10-23 LAB — RENAL FUNCTION PANEL
ALBUMIN: 3.1 g/dL — AB (ref 3.5–5.0)
Anion gap: 12 (ref 5–15)
BUN: 39 mg/dL — AB (ref 8–23)
CHLORIDE: 98 mmol/L (ref 98–111)
CO2: 27 mmol/L (ref 22–32)
Calcium: 8.8 mg/dL — ABNORMAL LOW (ref 8.9–10.3)
Creatinine, Ser: 8.59 mg/dL — ABNORMAL HIGH (ref 0.61–1.24)
GFR, EST AFRICAN AMERICAN: 6 mL/min — AB (ref >60)
GFR, EST NON AFRICAN AMERICAN: 5 mL/min — AB (ref >60)
GLUCOSE: 113 mg/dL — AB (ref 70–99)
Phosphorus: 4.3 mg/dL (ref 2.5–4.6)
Potassium: 3.4 mmol/L — ABNORMAL LOW (ref 3.5–5.1)
SODIUM: 137 mmol/L (ref 135–145)

## 2018-10-23 LAB — CBC
HCT: 30.2 % — ABNORMAL LOW (ref 39.0–52.0)
Hemoglobin: 9.2 g/dL — ABNORMAL LOW (ref 13.0–17.0)
MCH: 33.5 pg (ref 26.0–34.0)
MCHC: 30.5 g/dL (ref 30.0–36.0)
MCV: 109.8 fL — AB (ref 80.0–100.0)
NRBC: 0 % (ref 0.0–0.2)
PLATELETS: 175 10*3/uL (ref 150–400)
RBC: 2.75 MIL/uL — AB (ref 4.22–5.81)
RDW: 14.5 % (ref 11.5–15.5)
WBC: 7.9 10*3/uL (ref 4.0–10.5)

## 2018-10-23 LAB — PROTIME-INR
INR: 1.46
PROTHROMBIN TIME: 17.6 s — AB (ref 11.4–15.2)

## 2018-10-23 MED ORDER — WARFARIN SODIUM 7.5 MG PO TABS: 7.5000 mg | ORAL_TABLET | Freq: Once | ORAL | Status: DC

## 2018-10-23 MED ORDER — HEPARIN SODIUM (PORCINE) 1000 UNIT/ML IJ SOLN
INTRAMUSCULAR | Status: AC
  Administered 2018-10-23: 5800 [IU] via INTRAVENOUS_CENTRAL
  Filled 2018-10-23: qty 6

## 2018-10-23 MED ORDER — MIDODRINE HCL 10 MG PO TABS: 10.0000 mg | ORAL_TABLET | ORAL | 0 refills | Status: DC

## 2018-10-23 MED ORDER — CARVEDILOL 3.125 MG PO TABS: 3.1250 mg | ORAL_TABLET | ORAL | 0 refills | Status: DC

## 2018-10-23 MED ORDER — HEPARIN SODIUM (PORCINE) 1000 UNIT/ML IJ SOLN
INTRAMUSCULAR | Status: AC
  Administered 2018-10-23: 1000 [IU] via INTRAVENOUS_CENTRAL
  Filled 2018-10-23: qty 4

## 2018-10-23 NOTE — Discharge Instructions (Signed)

## 2018-10-23 NOTE — Care Management Obs Status (Signed)
[place] NOTIFICATION   Patient Details  Name: David [name] MRN: [id number] Date of Birth: [birth year]   Medicare Observation Status Notification Given:  Yes    [name], RN 10/23/2018, 10:08 AM

## 2018-10-23 NOTE — Care Management Note (Signed)
Case Management Note  Patient Details  Name: David [name] MRN: [id number] Date of Birth: 1941-10-14  Subjective/Objective:   From home with wife,  resp distress, pulmonary edema, afib, HD patient ESRD T,TH,Sat .  NCM spoke with patient , offered choice for [facility], he states he does not want HHRN, NCM informed him that [facility] will come at least 2 to 3 times a week , he said he does not want [place] services at this time he is refusing [place] services.                 Action/Plan: DC home when ready.   Expected Discharge Date:  10/23/18               Expected Discharge Plan:  Minor  In-House Referral:     Discharge planning Services  CM Consult  Post Acute Care Choice:  Home Health Choice offered to:  Patient  DME Arranged:    DME Agency:     HH Arranged:  RN, Patient Refused [place] Agency:     Status of Service:  Completed, signed off  If discussed at [employer] of Stay Meetings, dates discussed:    Additional Comments:  [name], RN 10/23/2018, 10:14 AM

## 2018-10-23 NOTE — Plan of Care (Signed)
Discussed plan of care for the evening with patient.  Stressed the importance of using the call button as well as pain management.  Patient has one more dialysis treatment before discharge.

## 2018-10-23 NOTE — Care Management Note (Signed)
Case Management Note  Patient Details  Name: David [name] MRN: [id number] Date of Birth: 28-Aug-1941  Subjective/Objective:    From home with wife,  resp distress, pulmonary edema, afib, HD patient ESRD T,TH,Sat .  NCM spoke with patient , offered choice for [facility], he states he does not want HHRN, NCM informed him that [facility] will come at least 2 to 3 times a week . MD spoke with patient about this and he decided to have [facility] for medication management.  NCM offered choice and he chose [facility] , referral made to [facility] with [place] will begin 24-48 hrs post dc.                 Action/Plan: DC  Home when ready.   Expected Discharge Date:  10/23/18               Expected Discharge Plan:  [place]  In-House Referral:     Discharge planning Services  CM Consult  Post Acute Care Choice:  Home Health Choice offered to:  Patient  DME Arranged:    DME Agency:     HH Arranged:  RN [place] Agency:  [place]  Status of Service:  Completed, signed off  If discussed at [place] of Stay Meetings, dates discussed:    Additional Comments:  [name], RN 10/23/2018, 11:49 AM

## 2018-10-23 NOTE — Progress Notes (Signed)
HD tx initiated via HD cath w/o problem Bilat ports: pull/push/flush equally w/o problem VSS Will continue to monitor while on HD tx 

## 2018-10-23 NOTE — Progress Notes (Signed)
HD tx completed @ 0320 w/o problem UF goal met Blood rinsed back VSS Report called to Jacqulyn Ducking, RN

## 2018-10-23 NOTE — Progress Notes (Signed)
ANTICOAGULATION CONSULT NOTE - Follow up [place] for Coumadin Indication: atrial fibrillation  Allergies  Allergen Reactions  . Cozaar [Losartan Potassium] Other (See Comments)    Causes constipation     Patient Measurements: Height: 6\' 2"  (188 cm) Weight: 185 lb 13.6 oz (84.3 kg) IBW/kg (Calculated) : 82.2  Vital Signs: Temp: 97.8 F (36.6 C) (11/20 0718) Temp Source: Oral (11/20 0718) BP: 141/75 (11/20 0718) Pulse Rate: 86 (11/20 0718)  Labs: Recent Labs    10/22/18 0536 10/22/18 1204 10/23/18 0038 10/23/18 0910  HGB 11.5* 9.9* 9.2*  --   HCT 37.7* 32.2* 30.2*  --   PLT 195 190 175  --   LABPROT 16.6*  --   --  17.6*  INR 1.36  --   --  1.46  CREATININE 6.16* 6.97* 8.59*  --     Estimated Creatinine Clearance: 8.5 mL/min (A) (by C-G formula based on SCr of 8.59 mg/dL (H)).   Medical History: Past Medical History:  Diagnosis Date  . Acute on chronic systolic and diastolic heart failure, NYHA class 4 ([place])   . Anemia, iron deficiency 11/15/2011  . Arthritis   . Arthritis    "hands, right knee, feet" (02/21/2018)  . Atrial fibrillation ([place])   . Cancer [facility])    hx of prostate; s/p radioactive seed implant 10/2009 Dr Janice Norrie  . Cardiac arrest ([place]) 02/17/2018  . CHF (congestive heart failure) ([place])   . Chronic kidney disease   . Chronic lower back pain   . CKD (chronic kidney disease) stage V requiring chronic dialysis ([place])   . ESRD on dialysis ([place])    started 02/2018  . GERD (gastroesophageal reflux disease)   . Glaucoma   . Gout    daily RX (02/21/2018)  . Heart murmur    "mild" per pt  . Hepatitis    years ago  . History of cardiac cath 2004   negative for CAD  . History of myocardial perfusion scan 02/2010   negative for coronary insufficiency (LVEF 27%)  . HIV (human immunodeficiency virus infection) ([place])   . HIV infection ([place])   . Hyperlipidemia   . Hypertension    followed by [facility] (Dr Dani Gobble  [place])  . Hypertension   . Nonischemic cardiomyopathy ([place])   . Pneumonia 11/2017  . Prostate cancer ([place])   . Sinus bradycardia   . Sleep apnea    does not use a cpap  . Stroke [facility])    "mini stroke" years ago  . Systolic and diastolic CHF, acute ([place]) 06/2010   felt to be secondary to hypertensive cardiomyopathy    Assessment: 77yo male presenting with shortness of breath, admitted with pulmonary edema and hypertensive emergency. Patient has history of atrial fibrillation, taking warfarin (per 11/4 anticoag visit note: home dose 5mg  every day except 7.5mg  every Friday, last PTA dose unknown). Pharmacy has been consulted to dose warfarin.  INR this morning remains subtherapeutic but trended up from previous day at 1.46. CBC down from previous day with Hgb 9.2 and platelets 175. No documented bleeding. Per MAR, dose of warfarin was not administered 11/19, but patient's RN reports that nothing was reported to him at handoff this morning from night nurse. As INR has increased, may just be that the dose was given but not charted. In any case, INR subtherapeutic so will redose warfarin at 1.5 times home dose again tonight.   Goal of Therapy:  INR 2-3    Plan:  Warfarin 7.5mg  x1 Monitor daily INR, CBC Monitor for s/sx bleeding   Brendolyn Patty, PharmD PGY1 Pharmacy Resident Phone [phone]  10/23/2018   11:24 AM

## 2018-10-24 DIAGNOSIS — N186 End stage renal disease: Secondary | ICD-10-CM | POA: Diagnosis not present

## 2018-10-24 DIAGNOSIS — N2581 Secondary hyperparathyroidism of renal origin: Secondary | ICD-10-CM | POA: Diagnosis not present

## 2018-10-25 NOTE — Discharge Summary (Signed)
[facility] Discharge Summary   Patient: David [name] JWJ:[id number]   PCP: [name], NP DOB: May 09, 1941   Date of admission: 10/21/2018   Date of discharge: 10/23/2018     Discharge Diagnoses:  Principal diagnosis Acute on chronic combined systolic and diastolic CHF   Acute respiratory failure with hypoxia (HCC)   Chronic obstructive pulmonary disease (HCC)   HIV (human immunodeficiency virus infection) ([place])   ESRD on dialysis ([place])   Atrial fibrillation with RVR ([place])   Chronic anticoagulation   CAD (coronary artery disease)   Gout   Hypertensive emergency   Acute on chronic combined systolic and diastolic CHF (congestive heart failure) ([place])   Leukocytosis   Admitted From: home Disposition:  Home   Recommendations for Outpatient Follow-up:  1. please follow-up with PCP in 1 week.  Follow-up Information    [name], NP. Schedule an appointment as soon as possible for a visit in 1 week(s).   Specialty:  Internal Medicine Contact information: [place] [address] [place], [facility].   Specialty:  Home Health Services Why:  [facility] Contact information: [address] [place] [address] [phone]          Diet recommendation: renal diet  Activity: The patient is advised to gradually reintroduce usual activities.  Discharge Condition: good  Code Status: full code  History of present illness: As per the H and P dictated on admission, "[name] is a 77 y.o. male with medical history significant of ESRD-HD (TTS), hypertension, hyperlipidemia, TIA, gout, CHF with EF of 15%, cardiac arrest, CAD, HIV (CD4=380), prostate cancer, who presents with shortness of breath.   Patient states that he developed shortness of breath today, which has been progressively getting worse.  He can barely speaking full sentences in ED.  Patient hascough  with clear mucus production.  Denies chest pain, fever or chills.  No nausea, vomiting, diarrhea, abdominal pain, symptoms of UTI or unilateral weakness.  Patient states that he is compliant to dialysis, last dialysis was on Saturday.  Patient is hypertensive with blood pressure 202/126 whicn improved to 156/106 on IV nitroglycerin drip.  ED Course: pt was found to have WBC 12.6, negative troponin, potassium 3.8, creatinine 10.1, BUN 47, temperature 95, tachycardia, tachypnea, oxygen saturation 97% on room air, BiPAP started, chest x-ray showed pulmonary edema and airspace disease in RML and RLL. Pt is placed on stepdown bed for observation.  Nephrology was consulted for urgent dialysis."  Hospital Course:  Summary of his active problems in the hospital is as following. Acute respiratory failure with hypoxiadue to pulmonary edema -Suspect his frequent cramping on HD limits volume removal -Improving, off BiPAP this morning -Status post urgent HD overnight -Unclear what his dry weight is, volume management per renal with HD -2D echo on 02/18/2018 showed EF of 15-20% with grade 1 diastolic dysfunction -next HD per Renal -repeat CXR -Clinically do not suspect pneumonia, suspect his leukocytosis is reactive, blood cultures are negative so far  Hypertensive urgency: -Was on nitroglycerin drip overnight, now off  -Improved with HD  -continueCoreg -Added amlodipine -Volume management with HD  ESRD on dialysis(TTS): -HD as above  HIV (human immunodeficiency virus infection): CD4=380 and VL<20 on 06/19/18 -Continue home HIV medications  Paroxysmal Atrial Fibrillationwith RVR:  -CHA2DS2-VASc Scoreis 7,  on Coumadin at home -continueamiodarone, Coreg -Coumadin per pharmacy INR low, reports that his Coumadin was recently  held for AV access procedures and restarted few days back  Chronic obstructive pulmonary disease ([place]): -BronchodilatorsandSingulair -Mucinex for cough  CAD  (coronary artery disease): No CP -continue coreg  Gout: -continue home allopurinol  Leukocytosis: -Suspect this is reactive -Follow-up blood cultures he has a right IJ [facility]  All other chronic medical condition were stable during the hospitalization.  Patient was ambulatory without any assistance. On the day of the discharge the patient's vitals were stable , and no other acute medical condition were reported by patient. the patient was felt safe to be discharge at home with family.  Consultants: nephrology  Procedures: HD  DISCHARGE MEDICATION: Allergies as of 10/23/2018      Reactions   Cozaar [losartan Potassium] Other (See Comments)   Causes constipation      Medication List    STOP taking these medications   HYDROcodone-acetaminophen 5-325 MG tablet Commonly known as:  NORCO/VICODIN     TAKE these medications   acetaminophen 500 MG tablet Commonly known as:  TYLENOL Take 1,000 mg by mouth every 6 (six) hours as needed for mild pain.   allopurinol 100 MG tablet Commonly known as:  ZYLOPRIM Take 100 mg by mouth every evening.   amiodarone 200 MG tablet Commonly known as:  PACERONE TAKE 1 TABLET(200 MG) BY MOUTH DAILY What changed:  See the new instructions.   AURYXIA 1 GM 210 MG(Fe) tablet Generic drug:  ferric citrate Take 420 mg by mouth 3 (three) times daily with meals.   brimonidine 0.15 % ophthalmic solution Commonly known as:  ALPHAGAN Place 1 drop into the left eye 2 (two) times daily.   carvedilol 3.125 MG tablet Commonly known as:  COREG Take 1 tablet (3.125 mg total) by mouth as directed. Take twice a day on Monday, Wednesday, Friday, Sunday. What changed:    when to take this  additional instructions   CORICIDIN HBP CONGESTION/COUGH PO Take 1 tablet by mouth every 8 (eight) hours as needed (cold symptoms).   DESCOVY 200-25 MG tablet Generic drug:  emtricitabine-tenofovir AF Take 1 tablet by mouth every morning.     dorzolamide-timolol 22.3-6.8 MG/ML ophthalmic solution Commonly known as:  COSOPT Place 1 drop into the left eye 2 (two) times daily.   latanoprost 0.005 % ophthalmic solution Commonly known as:  XALATAN Place 1 drop into the left eye at bedtime.   midodrine 10 MG tablet Commonly known as:  PROAMATINE Take 1 tablet (10 mg total) by mouth as directed. Twice a day on the day of HD Tuesday, Thursday, Saturday. What changed:    when to take this  additional instructions   montelukast 10 MG tablet Commonly known as:  SINGULAIR Take 1 tablet (10 mg total) by mouth at bedtime.   multivitamin Tabs tablet Take 1 tablet by mouth daily.   omeprazole 40 MG capsule Commonly known as:  PRILOSEC Take 1 capsule (40 mg total) by mouth daily.   TIVICAY 50 MG tablet Generic drug:  dolutegravir Take 50 mg by mouth every morning.   Vitamin D (Ergocalciferol) 1.25 MG (50000 UT) Caps capsule Commonly known as:  DRISDOL Take 50,000 Units by mouth every Friday.   warfarin 5 MG tablet Commonly known as:  COUMADIN Take as directed. If you are unsure how to take this medication, talk to your nurse or doctor. Original instructions:  TAKE 1 TO 1 AND 1/2 TABLETS BY MOUTH DAILY AS DIRECTED BY COUMADIN CLINIC What changed:  See the new instructions.  Allergies  Allergen Reactions  . Cozaar [Losartan Potassium] Other (See Comments)    Causes constipation    Discharge Instructions    Diet renal [id number]   Complete by:  As directed    Discharge instructions   Complete by:  As directed    It is important that you read following instructions as well as go over your medication list with RN to help you understand your care after this hospitalization.  Discharge Instructions: Please follow-up with PCP in one week  Please request your primary care physician to go over all Hospital Tests and Procedure/Radiological results at the follow up,  Please get all Hospital records sent to your  PCP by signing hospital release before you go home.   Do not take more than prescribed Pain, Sleep and Anxiety Medications. You were cared for by a hospitalist during your hospital stay. If you have any questions about your discharge medications or the care you received while you were in the hospital after you are discharged, you can call the unit you were admitted to and ask to speak with the hospitalist on call if the hospitalist that took care of you is not available.  Once you are discharged, your primary care physician will handle any further medical issues. Please note that NO REFILLS for any discharge medications will be authorized once you are discharged, as it is imperative that you return to your primary care physician (or establish a relationship with a primary care physician if you do not have one) for your aftercare needs so that they can reassess your need for medications and monitor your lab values. You Must read complete instructions/literature along with all the possible adverse reactions/side effects for all the Medicines you take and that have been prescribed to you. Take any new Medicines after you have completely understood and accept all the possible adverse reactions/side effects. Wear Seat belts while driving.   Increase activity slowly   Complete by:  As directed      Discharge Exam: Filed Weights   10/22/18 0431 10/23/18 0010 10/23/18 0327  Weight: 85.8 kg 86.3 kg 84.3 kg   Vitals:   10/23/18 0718 10/23/18 0738  BP: (!) 141/75   Pulse: 86   Resp: 14   Temp: 97.8 F (36.6 C)   SpO2: 97% 98%   General: Appear in no distress, no Rash; Oral Mucosa moist. Cardiovascular: S1 and S2 Present, aortic systolic  Murmur, no JVD Respiratory: Bilateral Air entry present and Clear to Auscultation, no Crackles, no wheezes Abdomen: Bowel Sound present, Soft and no tenderness Extremities: no Pedal edema, no calf tenderness Neurology: Grossly no focal neuro deficit.  The  results of significant diagnostics from this hospitalization (including imaging, microbiology, ancillary and laboratory) are listed below for reference.    Significant Diagnostic Studies: Dg Chest Port 1 View  Result Date: 10/21/2018 CLINICAL DATA:  Shortness of breath. Dialysis patient. History of congestive heart failure, COPD, end-stage renal disease. EXAM: PORTABLE CHEST 1 VIEW COMPARISON:  04/30/2018 FINDINGS: Right central venous catheter with tip over the cavoatrial junction region. No pneumothorax. Cardiac enlargement. Airspace disease in the right mid and lower lungs. This may represent pneumonia or asymmetrical edema. No consolidation on the left. No blunting of costophrenic angles. No pneumothorax. Mediastinal contours appear intact. Degenerative changes in the spine. IMPRESSION: Airspace disease in the right mid and lower lungs suggesting pneumonia or asymmetrical edema. Cardiac enlargement. Electronically Signed   By: Lucienne Capers M.D.   On: 10/21/2018 22:49  Microbiology: Recent Results (from the past 240 hour(s))  MRSA PCR Screening     Status: None   Collection Time: 10/22/18  4:17 AM  Result Value Ref Range Status   MRSA by PCR NEGATIVE NEGATIVE Final    Comment:        The GeneXpert MRSA Assay (FDA approved for NASAL specimens only), is one component of a comprehensive MRSA colonization surveillance program. It is not intended to diagnose MRSA infection nor to guide or monitor treatment for MRSA infections. Performed at [facility], [place] [address], [place], [place] [address]   Culture, blood (Routine X 2) w Reflex to ID Panel     Status: None (Preliminary result)   Collection Time: 10/22/18  5:36 AM  Result Value Ref Range Status   Specimen Description BLOOD BLOOD LEFT HAND  Final   Special Requests Blood Culture adequate volume  Final   Culture   Final    NO GROWTH 3 DAYS Performed at [facility], [place] [address] [place], [place] [address]     Report Status PENDING  Incomplete  Culture, blood (Routine X 2) w Reflex to ID Panel     Status: None (Preliminary result)   Collection Time: 10/22/18  5:36 AM  Result Value Ref Range Status   Specimen Description BLOOD BLOOD LEFT HAND  Final   Special Requests AEROBIC BOTTLE ONLY Blood Culture adequate volume  Final   Culture   Final    NO GROWTH 3 DAYS Performed at [facility], [place] [address] [place], [place] [address]    Report Status PENDING  Incomplete     Labs: CBC: Recent Labs  Lab 10/21/18 2230 10/21/18 2236 10/22/18 0536 10/22/18 1204 10/23/18 0038  WBC 12.6*  --  11.9* 10.4 7.9  HGB 11.5* 13.6 11.5* 9.9* 9.2*  HCT 37.1* 40.0 37.7* 32.2* 30.2*  MCV 111.7*  --  109.9* 109.5* 109.8*  PLT 301  --  195 190 384   Basic Metabolic Panel: Recent Labs  Lab 10/21/18 2230 10/21/18 2236 10/22/18 0536 10/22/18 1204 10/23/18 0038  NA 140 139 139 136 137  K 3.9 3.8 3.8 3.9 3.4*  CL 98 99 96* 97* 98  CO2 25  --  27 27 27   GLUCOSE 230* 225* 119* 116* 113*  BUN 47* 47* 23 27* 39*  CREATININE 9.89* 10.10* 6.16* 6.97* 8.59*  CALCIUM 9.0  --  9.6 9.1 8.8*  PHOS  --   --   --  3.6 4.3   Liver Function Tests: Recent Labs  Lab 10/21/18 2230 10/22/18 1204 10/23/18 0038  AST 33  --   --   ALT 20  --   --   ALKPHOS 108  --   --   BILITOT 0.7  --   --   PROT 7.6  --   --   ALBUMIN 3.9 3.4* 3.1*   No results for input(s): LIPASE, AMYLASE in the last 168 hours. No results for input(s): AMMONIA in the last 168 hours. Cardiac Enzymes: No results for input(s): CKTOTAL, CKMB, CKMBINDEX, TROPONINI in the last 168 hours. BNP (last 3 results) Recent Labs    11/24/17 1755 01/21/18 0100 02/17/18 0300  BNP 999.1* 975.9* 800.9*   CBG: No results for input(s): GLUCAP in the last 168 hours. Time spent: 35 minutes  Signed:  Berle Mull  [facility] 10/23/2018 , 3:09 PM

## 2018-10-26 DIAGNOSIS — N186 End stage renal disease: Secondary | ICD-10-CM | POA: Diagnosis not present

## 2018-10-26 DIAGNOSIS — N2581 Secondary hyperparathyroidism of renal origin: Secondary | ICD-10-CM | POA: Diagnosis not present

## 2018-10-27 LAB — CULTURE, BLOOD (ROUTINE X 2)
CULTURE: NO GROWTH
CULTURE: NO GROWTH
Special Requests: ADEQUATE
Special Requests: ADEQUATE

## 2018-10-28 ENCOUNTER — Telehealth: Payer: Self-pay | Admitting: *Deleted

## 2018-10-28 DIAGNOSIS — N186 End stage renal disease: Secondary | ICD-10-CM | POA: Diagnosis not present

## 2018-10-28 DIAGNOSIS — N2581 Secondary hyperparathyroidism of renal origin: Secondary | ICD-10-CM | POA: Diagnosis not present

## 2018-10-28 NOTE — Telephone Encounter (Signed)
Received Physician Orders from [facility]; forwarded to provider/SLS 11/25

## 2018-10-29 ENCOUNTER — Telehealth: Payer: Self-pay | Admitting: *Deleted

## 2018-10-29 ENCOUNTER — Other Ambulatory Visit: Payer: Self-pay | Admitting: Cardiovascular Disease

## 2018-10-29 NOTE — Telephone Encounter (Signed)
Received Physician Orders from [facility]; forwarded to provider/SLS 11/26

## 2018-10-29 NOTE — Telephone Encounter (Signed)
Rx has been sent to the pharmacy electronically. ° °

## 2018-10-30 DIAGNOSIS — N2581 Secondary hyperparathyroidism of renal origin: Secondary | ICD-10-CM | POA: Diagnosis not present

## 2018-10-30 DIAGNOSIS — N186 End stage renal disease: Secondary | ICD-10-CM | POA: Diagnosis not present

## 2018-11-01 ENCOUNTER — Emergency Department (HOSPITAL_COMMUNITY): Payer: Medicare HMO

## 2018-11-01 ENCOUNTER — Observation Stay (HOSPITAL_COMMUNITY)
Admission: EM | Admit: 2018-11-01 | Discharge: 2018-11-02 | Disposition: A | Discharge status: Home / Self Care | Payer: Medicare HMO | Attending: Internal Medicine | Admitting: Internal Medicine

## 2018-11-01 ENCOUNTER — Other Ambulatory Visit: Payer: Self-pay

## 2018-11-01 ENCOUNTER — Encounter (HOSPITAL_COMMUNITY): Payer: Self-pay | Admitting: *Deleted

## 2018-11-01 DIAGNOSIS — G4730 Sleep apnea, unspecified: Secondary | ICD-10-CM | POA: Diagnosis not present

## 2018-11-01 DIAGNOSIS — I2510 Atherosclerotic heart disease of native coronary artery without angina pectoris: Secondary | ICD-10-CM | POA: Insufficient documentation

## 2018-11-01 DIAGNOSIS — E785 Hyperlipidemia, unspecified: Secondary | ICD-10-CM | POA: Diagnosis not present

## 2018-11-01 DIAGNOSIS — I4891 Unspecified atrial fibrillation: Secondary | ICD-10-CM | POA: Insufficient documentation

## 2018-11-01 DIAGNOSIS — Z8674 Personal history of sudden cardiac arrest: Secondary | ICD-10-CM | POA: Insufficient documentation

## 2018-11-01 DIAGNOSIS — Z8673 Personal history of transient ischemic attack (TIA), and cerebral infarction without residual deficits: Secondary | ICD-10-CM | POA: Insufficient documentation

## 2018-11-01 DIAGNOSIS — Z7901 Long term (current) use of anticoagulants: Secondary | ICD-10-CM | POA: Insufficient documentation

## 2018-11-01 DIAGNOSIS — N186 End stage renal disease: Secondary | ICD-10-CM | POA: Diagnosis not present

## 2018-11-01 DIAGNOSIS — R69 Illness, unspecified: Secondary | ICD-10-CM | POA: Diagnosis not present

## 2018-11-01 DIAGNOSIS — J810 Acute pulmonary edema: Secondary | ICD-10-CM

## 2018-11-01 DIAGNOSIS — D509 Iron deficiency anemia, unspecified: Secondary | ICD-10-CM | POA: Insufficient documentation

## 2018-11-01 DIAGNOSIS — Z87891 Personal history of nicotine dependence: Secondary | ICD-10-CM | POA: Diagnosis not present

## 2018-11-01 DIAGNOSIS — J9601 Acute respiratory failure with hypoxia: Secondary | ICD-10-CM | POA: Diagnosis not present

## 2018-11-01 DIAGNOSIS — B20 Human immunodeficiency virus [HIV] disease: Secondary | ICD-10-CM | POA: Insufficient documentation

## 2018-11-01 DIAGNOSIS — H409 Unspecified glaucoma: Secondary | ICD-10-CM | POA: Insufficient documentation

## 2018-11-01 DIAGNOSIS — R0789 Other chest pain: Secondary | ICD-10-CM | POA: Diagnosis not present

## 2018-11-01 DIAGNOSIS — I5043 Acute on chronic combined systolic (congestive) and diastolic (congestive) heart failure: Secondary | ICD-10-CM | POA: Insufficient documentation

## 2018-11-01 DIAGNOSIS — I132 Hypertensive heart and chronic kidney disease with heart failure and with stage 5 chronic kidney disease, or end stage renal disease: Secondary | ICD-10-CM | POA: Insufficient documentation

## 2018-11-01 DIAGNOSIS — R0602 Shortness of breath: Secondary | ICD-10-CM | POA: Diagnosis not present

## 2018-11-01 DIAGNOSIS — M109 Gout, unspecified: Secondary | ICD-10-CM | POA: Insufficient documentation

## 2018-11-01 DIAGNOSIS — Z992 Dependence on renal dialysis: Secondary | ICD-10-CM | POA: Diagnosis not present

## 2018-11-01 DIAGNOSIS — Z8546 Personal history of malignant neoplasm of prostate: Secondary | ICD-10-CM | POA: Insufficient documentation

## 2018-11-01 DIAGNOSIS — I5042 Chronic combined systolic (congestive) and diastolic (congestive) heart failure: Secondary | ICD-10-CM | POA: Diagnosis present

## 2018-11-01 DIAGNOSIS — K219 Gastro-esophageal reflux disease without esophagitis: Secondary | ICD-10-CM | POA: Insufficient documentation

## 2018-11-01 DIAGNOSIS — R7309 Other abnormal glucose: Secondary | ICD-10-CM | POA: Diagnosis present

## 2018-11-01 DIAGNOSIS — Z79899 Other long term (current) drug therapy: Secondary | ICD-10-CM | POA: Insufficient documentation

## 2018-11-01 DIAGNOSIS — R739 Hyperglycemia, unspecified: Secondary | ICD-10-CM | POA: Diagnosis not present

## 2018-11-01 DIAGNOSIS — E8770 Fluid overload, unspecified: Secondary | ICD-10-CM | POA: Diagnosis present

## 2018-11-01 DIAGNOSIS — J449 Chronic obstructive pulmonary disease, unspecified: Secondary | ICD-10-CM | POA: Diagnosis not present

## 2018-11-01 DIAGNOSIS — R079 Chest pain, unspecified: Secondary | ICD-10-CM | POA: Diagnosis not present

## 2018-11-01 DIAGNOSIS — I454 Nonspecific intraventricular block: Secondary | ICD-10-CM | POA: Diagnosis not present

## 2018-11-01 DIAGNOSIS — I120 Hypertensive chronic kidney disease with stage 5 chronic kidney disease or end stage renal disease: Secondary | ICD-10-CM | POA: Diagnosis not present

## 2018-11-01 DIAGNOSIS — I447 Left bundle-branch block, unspecified: Secondary | ICD-10-CM | POA: Diagnosis not present

## 2018-11-01 LAB — CBC WITH DIFFERENTIAL/PLATELET
Abs Immature Granulocytes: 0.09 10*3/uL — ABNORMAL HIGH (ref 0.00–0.07)
BASOS ABS: 0.1 10*3/uL (ref 0.0–0.1)
Basophils Relative: 2 %
Eosinophils Absolute: 0.3 10*3/uL (ref 0.0–0.5)
Eosinophils Relative: 3 %
HCT: 41.2 % (ref 39.0–52.0)
Hemoglobin: 12.7 g/dL — ABNORMAL LOW (ref 13.0–17.0)
Immature Granulocytes: 1 %
Lymphocytes Relative: 21 %
Lymphs Abs: 1.8 10*3/uL (ref 0.7–4.0)
MCH: 34.1 pg — ABNORMAL HIGH (ref 26.0–34.0)
MCHC: 30.8 g/dL (ref 30.0–36.0)
MCV: 110.8 fL — ABNORMAL HIGH (ref 80.0–100.0)
Monocytes Absolute: 0.5 10*3/uL (ref 0.1–1.0)
Monocytes Relative: 6 %
NRBC: 0.2 % (ref 0.0–0.2)
Neutro Abs: 6 10*3/uL (ref 1.7–7.7)
Neutrophils Relative %: 67 %
Platelets: 271 10*3/uL (ref 150–400)
RBC: 3.72 MIL/uL — ABNORMAL LOW (ref 4.22–5.81)
RDW: 14.5 % (ref 11.5–15.5)
WBC: 8.9 10*3/uL (ref 4.0–10.5)

## 2018-11-01 LAB — TROPONIN I: Troponin I: 0.09 ng/mL (ref <0.03)

## 2018-11-01 LAB — I-STAT CHEM 8, ED
BUN: 62 mg/dL — ABNORMAL HIGH (ref 8–23)
Calcium, Ion: 1.04 mmol/L — ABNORMAL LOW (ref 1.15–1.40)
Chloride: 103 mmol/L (ref 98–111)
Creatinine, Ser: 11.1 mg/dL — ABNORMAL HIGH (ref 0.61–1.24)
Glucose, Bld: 130 mg/dL — ABNORMAL HIGH (ref 70–99)
HCT: 42 % (ref 39.0–52.0)
Hemoglobin: 14.3 g/dL (ref 13.0–17.0)
Potassium: 5.3 mmol/L — ABNORMAL HIGH (ref 3.5–5.1)
Sodium: 138 mmol/L (ref 135–145)
TCO2: 29 mmol/L (ref 22–32)

## 2018-11-01 LAB — I-STAT TROPONIN, ED: Troponin i, poc: 0.06 ng/mL (ref 0.00–0.08)

## 2018-11-01 LAB — PROTIME-INR
INR: 2.04
Prothrombin Time: 22.8 seconds — ABNORMAL HIGH (ref 11.4–15.2)

## 2018-11-01 LAB — COMPREHENSIVE METABOLIC PANEL
ALT: 25 U/L (ref 0–44)
AST: 31 U/L (ref 15–41)
Albumin: 4.1 g/dL (ref 3.5–5.0)
Alkaline Phosphatase: 118 U/L (ref 38–126)
Anion gap: 17 — ABNORMAL HIGH (ref 5–15)
BUN: 64 mg/dL — ABNORMAL HIGH (ref 8–23)
CO2: 22 mmol/L (ref 22–32)
Calcium: 9.5 mg/dL (ref 8.9–10.3)
Chloride: 100 mmol/L (ref 98–111)
Creatinine, Ser: 10.14 mg/dL — ABNORMAL HIGH (ref 0.61–1.24)
GFR calc Af Amer: 5 mL/min — ABNORMAL LOW (ref >60)
GFR calc non Af Amer: 4 mL/min — ABNORMAL LOW (ref >60)
Glucose, Bld: 131 mg/dL — ABNORMAL HIGH (ref 70–99)
Potassium: 5.2 mmol/L — ABNORMAL HIGH (ref 3.5–5.1)
Sodium: 139 mmol/L (ref 135–145)
Total Bilirubin: 0.6 mg/dL (ref 0.3–1.2)
Total Protein: 8.1 g/dL (ref 6.5–8.1)

## 2018-11-01 LAB — BRAIN NATRIURETIC PEPTIDE: B Natriuretic Peptide: 556.2 pg/mL — ABNORMAL HIGH (ref 0.0–100.0)

## 2018-11-01 MED ORDER — FERRIC CITRATE 1 GM 210 MG(FE) PO TABS
420.0000 mg | ORAL_TABLET | Freq: Three times a day (TID) | ORAL | Status: DC
  Administered 2018-11-01 – 2018-11-02 (×2): 420 mg via ORAL
  Filled 2018-11-01 (×4): qty 2

## 2018-11-01 MED ORDER — NITROGLYCERIN IN D5W 200-5 MCG/ML-% IV SOLN
0.0000 ug/min | INTRAVENOUS | Status: DC
  Administered 2018-11-01: 10 ug/min via INTRAVENOUS
  Filled 2018-11-01: qty 250

## 2018-11-01 MED ORDER — RENA-VITE PO TABS
1.0000 | ORAL_TABLET | Freq: Every day | ORAL | Status: DC
  Administered 2018-11-01 – 2018-11-02 (×2): 1 via ORAL
  Filled 2018-11-01 (×2): qty 1

## 2018-11-01 MED ORDER — HYDROXYZINE HCL 25 MG PO TABS: 25.0000 mg | ORAL_TABLET | Freq: Three times a day (TID) | ORAL | Status: DC | PRN

## 2018-11-01 MED ORDER — SODIUM CHLORIDE 0.9% FLUSH
3.0000 mL | Freq: Two times a day (BID) | INTRAVENOUS | Status: DC
  Administered 2018-11-01 – 2018-11-02 (×3): 3 mL via INTRAVENOUS

## 2018-11-01 MED ORDER — EMTRICITABINE-TENOFOVIR AF 200-25 MG PO TABS
1.0000 | ORAL_TABLET | Freq: Every day | ORAL | Status: DC
  Administered 2018-11-01 – 2018-11-02 (×2): 1 via ORAL
  Filled 2018-11-01 (×2): qty 1

## 2018-11-01 MED ORDER — MIDODRINE HCL 5 MG PO TABS: 10.0000 mg | ORAL_TABLET | ORAL | Status: DC

## 2018-11-01 MED ORDER — LIDOCAINE-PRILOCAINE 2.5-2.5 % EX CREA: 1.0000 "application " | TOPICAL_CREAM | CUTANEOUS | Status: DC | PRN

## 2018-11-01 MED ORDER — SODIUM CHLORIDE 0.9 % IV SOLN: 100.0000 mL | INTRAVENOUS | Status: DC | PRN

## 2018-11-01 MED ORDER — ALLOPURINOL 100 MG PO TABS
100.0000 mg | ORAL_TABLET | Freq: Every evening | ORAL | Status: DC
  Administered 2018-11-01: 100 mg via ORAL
  Filled 2018-11-01: qty 1

## 2018-11-01 MED ORDER — HEPARIN SODIUM (PORCINE) 1000 UNIT/ML IJ SOLN
INTRAMUSCULAR | Status: AC
  Filled 2018-11-01: qty 4

## 2018-11-01 MED ORDER — SORBITOL 70 % SOLN
30.0000 mL | Status: DC | PRN
  Filled 2018-11-01: qty 30

## 2018-11-01 MED ORDER — ONDANSETRON HCL 4 MG/2ML IJ SOLN: 4.0000 mg | Freq: Once | INTRAMUSCULAR | Status: DC

## 2018-11-01 MED ORDER — DOCUSATE SODIUM 283 MG RE ENEM
1.0000 | ENEMA | RECTAL | Status: DC | PRN
  Filled 2018-11-01: qty 1

## 2018-11-01 MED ORDER — WARFARIN SODIUM 10 MG PO TABS
10.0000 mg | ORAL_TABLET | Freq: Once | ORAL | Status: AC
  Administered 2018-11-01: 10 mg via ORAL
  Filled 2018-11-01: qty 1

## 2018-11-01 MED ORDER — ZOLPIDEM TARTRATE 5 MG PO TABS: 5.0000 mg | ORAL_TABLET | Freq: Every evening | ORAL | Status: DC | PRN

## 2018-11-01 MED ORDER — LATANOPROST 0.005 % OP SOLN
1.0000 [drp] | Freq: Every day | OPHTHALMIC | Status: DC
  Administered 2018-11-01: 1 [drp] via OPHTHALMIC
  Filled 2018-11-01: qty 2.5

## 2018-11-01 MED ORDER — VITAMIN D (ERGOCALCIFEROL) 1.25 MG (50000 UNIT) PO CAPS
50000.0000 [IU] | ORAL_CAPSULE | ORAL | Status: DC
  Administered 2018-11-01: 50000 [IU] via ORAL
  Filled 2018-11-01: qty 1

## 2018-11-01 MED ORDER — DOLUTEGRAVIR SODIUM 50 MG PO TABS
50.0000 mg | ORAL_TABLET | Freq: Every day | ORAL | Status: DC
  Administered 2018-11-01 – 2018-11-02 (×2): 50 mg via ORAL
  Filled 2018-11-01 (×2): qty 1

## 2018-11-01 MED ORDER — NEPRO/CARBSTEADY PO LIQD: 237.0000 mL | Freq: Three times a day (TID) | ORAL | Status: DC | PRN

## 2018-11-01 MED ORDER — ONDANSETRON HCL 4 MG/2ML IJ SOLN: 4.0000 mg | Freq: Four times a day (QID) | INTRAMUSCULAR | Status: DC | PRN

## 2018-11-01 MED ORDER — CARVEDILOL 3.125 MG PO TABS: 3.1250 mg | ORAL_TABLET | ORAL | Status: DC

## 2018-11-01 MED ORDER — PENTAFLUOROPROP-TETRAFLUOROETH EX AERO: 1.0000 "application " | INHALATION_SPRAY | CUTANEOUS | Status: DC | PRN

## 2018-11-01 MED ORDER — HEPARIN SODIUM (PORCINE) 1000 UNIT/ML DIALYSIS
20.0000 [IU]/kg | INTRAMUSCULAR | Status: DC | PRN
  Filled 2018-11-01: qty 2

## 2018-11-01 MED ORDER — WARFARIN - PHARMACIST DOSING INPATIENT
Freq: Every day | Status: DC
  Administered 2018-11-01: 18:00:00

## 2018-11-01 MED ORDER — ALTEPLASE 2 MG IJ SOLR: 2.0000 mg | Freq: Once | INTRAMUSCULAR | Status: DC | PRN

## 2018-11-01 MED ORDER — CALCIUM CARBONATE ANTACID 1250 MG/5ML PO SUSP
500.0000 mg | Freq: Four times a day (QID) | ORAL | Status: DC | PRN
  Filled 2018-11-01: qty 5

## 2018-11-01 MED ORDER — ONDANSETRON 4 MG PO TBDP
4.0000 mg | ORAL_TABLET | Freq: Once | ORAL | Status: AC
  Administered 2018-11-01: 4 mg via ORAL

## 2018-11-01 MED ORDER — MIDODRINE HCL 5 MG PO TABS
10.0000 mg | ORAL_TABLET | ORAL | Status: DC
  Administered 2018-11-02: 10 mg via ORAL
  Filled 2018-11-01: qty 2

## 2018-11-01 MED ORDER — ACETAMINOPHEN 650 MG RE SUPP: 650.0000 mg | Freq: Four times a day (QID) | RECTAL | Status: DC | PRN

## 2018-11-01 MED ORDER — AMIODARONE HCL 200 MG PO TABS
200.0000 mg | ORAL_TABLET | Freq: Every day | ORAL | Status: DC
  Administered 2018-11-01 – 2018-11-02 (×2): 200 mg via ORAL
  Filled 2018-11-01 (×2): qty 1

## 2018-11-01 MED ORDER — BRIMONIDINE TARTRATE 0.15 % OP SOLN
1.0000 [drp] | Freq: Two times a day (BID) | OPHTHALMIC | Status: DC
  Administered 2018-11-01 – 2018-11-02 (×3): 1 [drp] via OPHTHALMIC
  Filled 2018-11-01: qty 5

## 2018-11-01 MED ORDER — ONDANSETRON HCL 4 MG PO TABS: 4.0000 mg | ORAL_TABLET | Freq: Four times a day (QID) | ORAL | Status: DC | PRN

## 2018-11-01 MED ORDER — CHLORHEXIDINE GLUCONATE CLOTH 2 % EX PADS
6.0000 | MEDICATED_PAD | Freq: Every day | CUTANEOUS | Status: DC
  Administered 2018-11-01: 6 via TOPICAL

## 2018-11-01 MED ORDER — DORZOLAMIDE HCL-TIMOLOL MAL 2-0.5 % OP SOLN
1.0000 [drp] | Freq: Two times a day (BID) | OPHTHALMIC | Status: DC
  Administered 2018-11-01 – 2018-11-02 (×3): 1 [drp] via OPHTHALMIC
  Filled 2018-11-01: qty 10

## 2018-11-01 MED ORDER — ACETAMINOPHEN 325 MG PO TABS: 650.0000 mg | ORAL_TABLET | Freq: Four times a day (QID) | ORAL | Status: DC | PRN

## 2018-11-01 MED ORDER — CAMPHOR-MENTHOL 0.5-0.5 % EX LOTN
1.0000 "application " | TOPICAL_LOTION | Freq: Three times a day (TID) | CUTANEOUS | Status: DC | PRN
  Filled 2018-11-01: qty 222

## 2018-11-01 MED ORDER — CALCITRIOL 0.25 MCG PO CAPS: 0.2500 ug | ORAL_CAPSULE | ORAL | Status: DC

## 2018-11-01 MED ORDER — MONTELUKAST SODIUM 10 MG PO TABS
10.0000 mg | ORAL_TABLET | Freq: Every day | ORAL | Status: DC
  Administered 2018-11-01: 10 mg via ORAL
  Filled 2018-11-01: qty 1

## 2018-11-01 MED ORDER — CHLORHEXIDINE GLUCONATE CLOTH 2 % EX PADS
6.0000 | MEDICATED_PAD | Freq: Every day | CUTANEOUS | Status: DC
  Administered 2018-11-02: 6 via TOPICAL

## 2018-11-01 MED ORDER — LIDOCAINE HCL (PF) 1 % IJ SOLN: 5.0000 mL | INTRAMUSCULAR | Status: DC | PRN

## 2018-11-01 MED ORDER — HEPARIN SODIUM (PORCINE) 1000 UNIT/ML DIALYSIS
1000.0000 [IU] | INTRAMUSCULAR | Status: DC | PRN
  Filled 2018-11-01: qty 1

## 2018-11-01 NOTE — Progress Notes (Signed)
ANTICOAGULATION CONSULT NOTE - Initial Consult  Pharmacy Consult for warfarin Indication: atrial fibrillation  Allergies  Allergen Reactions  . Cozaar [Losartan Potassium] Other (See Comments)    Causes constipation     Patient Measurements:    Vital Signs: Temp: 98.5 F (36.9 C) (11/29 0501) Temp Source: Oral (11/29 0501) BP: 145/79 (11/29 0830) Pulse Rate: 87 (11/29 0830)  Labs: Recent Labs    11/01/18 0512 11/01/18 0516  HGB 12.7* 14.3  HCT 41.2 42.0  PLT 271  --   LABPROT 22.8*  --   INR 2.04  --   CREATININE 10.14* 11.10*  TROPONINI 0.09*  --     Estimated Creatinine Clearance: 6.6 mL/min (A) (by C-G formula based on SCr of 11.1 mg/dL (H)).   Medical History: Past Medical History:  Diagnosis Date  . Acute on chronic systolic and diastolic heart failure, NYHA class 4 ([place])   . Anemia, iron deficiency 11/15/2011  . Arthritis    "hands, right knee, feet" (02/21/2018)  . Atrial fibrillation ([place])   . Cancer [facility])    hx of prostate; s/p radioactive seed implant 10/2009 Dr Janice Norrie  . Cardiac arrest ([place]) 02/17/2018  . CHF (congestive heart failure) ([place])   . Chronic lower back pain   . CKD (chronic kidney disease) stage V requiring chronic dialysis ([place])   . ESRD on dialysis ([place])    started 02/2018  . GERD (gastroesophageal reflux disease)   . Glaucoma   . Gout    daily RX (02/21/2018)  . Heart murmur    "mild" per pt  . Hepatitis    years ago  . History of cardiac cath 2004   negative for CAD  . History of myocardial perfusion scan 02/2010   negative for coronary insufficiency (LVEF 27%)  . HIV (human immunodeficiency virus infection) ([place])   . HIV infection ([place])   . Hyperlipidemia   . Hypertension    followed by [facility] (Dr Dani Gobble [place])  . Hypertension   . Nonischemic cardiomyopathy ([place])   . Pneumonia 11/2017  . Prostate cancer ([place])   . Sinus bradycardia   . Sleep apnea    does not use a cpap  . Stroke [facility])    "mini stroke" years ago  . Systolic and diastolic CHF, acute ([place]) 06/2010   felt to be secondary to hypertensive cardiomyopathy    Medications:  Scheduled:  . allopurinol  100 mg Oral QPM  . amiodarone  200 mg Oral Daily  . brimonidine  1 drop Left Eye BID  . [START ON 11/02/2018] calcitRIOL  0.25 mcg Oral Q T,Th,Sa-HD  . carvedilol  3.125 mg Oral UD  . Chlorhexidine Gluconate Cloth  6 each Topical Q0600  . dolutegravir  50 mg Oral Daily  . dorzolamide-timolol  1 drop Left Eye BID  . emtricitabine-tenofovir AF  1 tablet Oral Daily  . ferric citrate  420 mg Oral TID WC  . latanoprost  1 drop Left Eye QHS  . midodrine  10 mg Oral UD  . montelukast  10 mg Oral QHS  . multivitamin  1 tablet Oral Daily  . sodium chloride flush  3 mL Intravenous Q12H  . Vitamin D (Ergocalciferol)  50,000 Units Oral Q Fri    Assessment: 27 YOM with hx of afib on warfarin PTA, last dose 10/31/18, INR therapeutic on admission at 2.04.  H/H wnl. PTA dosing: 10mg  daily (previously taking 7.5mg  daily)  Goal of Therapy:  INR 2-3 Monitor platelets by  anticoagulation protocol: Yes   Plan:  Warfarin 10mg  PO x 1 tonight Daily INR, CBC, s/s bleeding  Bertis Ruddy, PharmD Clinical Pharmacist Please check AMION for all [place] numbers 11/01/2018 9:22 AM

## 2018-11-01 NOTE — ED Provider Notes (Signed)
[facility] EMERGENCY DEPARTMENT Provider Note   CSN: [id number] Arrival date & time: 11/01/18  0450     History   Chief Complaint Chief Complaint  Patient presents with  . Shortness of Breath    HPI [place] is a 77 y.o. male.  Level 5 caveat for acuity condition.  Patient presents via EMS with shortness of breath that woke him from sleep.  Patient history of ESRD on dialysis.  Last treatment was November 2017 he is due for dialysis later this morning due to holiday schedule.  He does make some urine.  EMS reports initial oxygenation in the 86% range on room air.  Nuys any chest pain.  On arrival patient developed nausea and vomiting but denies abdominal pain or fever. Patient denies any missed dialysis sessions.  Denies any leg pain or leg swelling.  Has a EF of 10%, HIV, ESRD and nonischemic cardiomyopathy.  The history is provided by the patient and the EMS personnel. The history is limited by the condition of the patient.  Shortness of Breath  Associated symptoms include cough and vomiting. Pertinent negatives include no fever, no headaches, no chest pain and no rash.    Past Medical History:  Diagnosis Date  . Acute on chronic systolic and diastolic heart failure, NYHA class 4 ([place])   . Anemia, iron deficiency 11/15/2011  . Arthritis   . Arthritis    "hands, right knee, feet" (02/21/2018)  . Atrial fibrillation ([place])   . Cancer [facility])    hx of prostate; s/p radioactive seed implant 10/2009 Dr Janice Norrie  . Cardiac arrest ([place]) 02/17/2018  . CHF (congestive heart failure) ([place])   . Chronic kidney disease   . Chronic lower back pain   . CKD (chronic kidney disease) stage V requiring chronic dialysis ([place])   . ESRD on dialysis ([place])    started 02/2018  . GERD (gastroesophageal reflux disease)   . Glaucoma   . Gout    daily RX (02/21/2018)  . Heart murmur    "mild" per pt  . Hepatitis    years ago  . History of cardiac cath 2004   negative  for CAD  . History of myocardial perfusion scan 02/2010   negative for coronary insufficiency (LVEF 27%)  . HIV (human immunodeficiency virus infection) ([place])   . HIV infection ([place])   . Hyperlipidemia   . Hypertension    followed by [facility] and Vascular (Dr Dani Gobble Croitoru)  . Hypertension   . Nonischemic cardiomyopathy ([place])   . Pneumonia 11/2017  . Prostate cancer ([place])   . Sinus bradycardia   . Sleep apnea    does not use a cpap  . Stroke [facility])    "mini stroke" years ago  . Systolic and diastolic CHF, acute ([place]) 06/2010   felt to be secondary to hypertensive cardiomyopathy    Patient Active Problem List   Diagnosis Date Noted  . Pulmonary edema 10/21/2018  . Gout 10/21/2018  . Hypertensive emergency 10/21/2018  . Acute on chronic combined systolic and diastolic CHF (congestive heart failure) ([place]) 10/21/2018  . Leukocytosis 10/21/2018  . Gastroesophageal reflux disease 08/28/2018  . Chronic anticoagulation 04/03/2018  . CAD (coronary artery disease) 04/03/2018  . Encounter for therapeutic drug monitoring 03/27/2018  . Atrial fibrillation with RVR ([place]) 03/25/2018  . Palliative care by specialist   . Advance care planning   . Goals of care, counseling/discussion   . Cardiac arrest ([place]) 02/17/2018  . Respiratory arrest (  [place])   . Acute on chronic systolic heart failure ([place])   . Chronic obstructive pulmonary disease ([place])   . HIV (human immunodeficiency virus infection) ([place])   . ESRD on dialysis ([place])   . Acute respiratory failure with hypoxia ([place]) 01/21/2018  . CAP (community acquired pneumonia)   . Aspiration pneumonia of both lower lobes due to regurgitated food ([place])   . Sepsis due to pneumonia ([place]) 11/22/2017  . AIDS ([place]) 05/14/2014  . 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 ARF (acute renal failure) ([place]) 05/06/2014  . Gastroparesis 05/06/2014  . Protein-calorie malnutrition, severe ([place]) 05/05/2014  . CKD (chronic kidney disease) stage 4, GFR 15-29 ml/min  (HCC) 05/02/2014  . Hypotension 05/02/2014  . Loss of weight 04/29/2014  . Conjunctivitis 04/29/2014  . 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 Low back pain 04/09/2012  . Osteoarthritis of hip 12/29/2011  . Iron deficiency anemia 06/28/2011  . Macrocytic anemia 04/02/2011  . 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 Hyperlipidemia 11/30/2010  . Transient cerebral ischemia 11/30/2010  . HYPERGLYCEMIA 11/30/2010    Past Surgical History:  Procedure Laterality Date  . AV FISTULA PLACEMENT Right 10/13/2016   Procedure: ARTERIOVENOUS (AV) FISTULA CREATION;  Surgeon: [name], MD;  Location: [facility];  Service: Vascular;  Laterality: Right;  . AV FISTULA PLACEMENT Right 10/13/2016   [name] [phone]  . AV FISTULA PLACEMENT Left 02/25/2018   Procedure: INSERTION OF ARTERIOVENOUS (AV) GORE-TEX GRAFT LEFT UPPER ARM;  Surgeon: Angelia Mould, MD;  Location: [facility];  Service: Vascular;  Laterality: Left;  . AV FISTULA PLACEMENT Right 08/07/2018   Procedure: Creation of right arm brachiocephalic Fistula;  Surgeon: Waynetta Sandy, MD;  Location: [facility];  Service: Vascular;  Laterality: Right;  . BASCILIC VEIN TRANSPOSITION Left 06/24/2015   Procedure: BASILIC VEIN TRANSPOSITION;  Surgeon: Mal Misty, MD;  Location: [facility];  Service: Vascular;  Laterality: Left;  . BASCILIC VEIN TRANSPOSITION Left 6/65/9935 Bascilic vein transposition (Left)   [name] [phone]  . CARDIAC CATHETERIZATION  02/04/2003   mildly depressed LV systolic fx EF 30%,SPQZRA coronaries/abdominal aorta/renal arteries.  Marland Kitchen CARDIAC CATHETERIZATION  2004  . CATARACT EXTRACTION, BILATERAL Bilateral   . COLONOSCOPY    . ESOPHAGOGASTRODUODENOSCOPY (EGD) WITH PROPOFOL N/A 05/05/2014   Procedure: ESOPHAGOGASTRODUODENOSCOPY (EGD) WITH PROPOFOL;  Surgeon: Missy Sabins, MD;  Location: [facility];  Service: Endoscopy;  Laterality: N/A;  . EYE SURGERY Bilateral    cataract surgery   . EYE SURGERY  Bilateral    glaucoma surgery  . EYE SURGERY    . GLAUCOMA SURGERY Bilateral   . INSERTION OF ARTERIOVENOUS (AV) ARTEGRAFT ARM  10/09/2018   Procedure: INSERTION OF ARTERIOVENOUS (AV) 58mm x 41cm ARTEGRAFT LEFT UPPER ARM;  Surgeon: Waynetta Sandy, MD;  Location: [facility];  Service: Vascular;;  . INSERTION PROSTATE RADIATION SEED    . IR FLUORO GUIDE CV LINE RIGHT  02/18/2018  . IR US GUIDE VASC ACCESS RIGHT  02/18/2018  . LEFT HEART CATH AND CORONARY ANGIOGRAPHY N/A 02/20/2018   Procedure: LEFT HEART CATH AND CORONARY ANGIOGRAPHY;  Surgeon: Jettie Booze, MD;  Location: [facility] CV LAB;  Service: Cardiovascular;  Laterality: N/A;  . NM MYOCAR PERF WALL MOTION  02/21/2010   normal  . RADIOACTIVE SEED IMPLANT  2010   prostate cancer  . THROMBECTOMY W/ EMBOLECTOMY Left 02/26/2018   Procedure: THROMBECTOMY ARTERIOVENOUS GRAFT;  Surgeon: Angelia Mould, MD;  Location: [facility];  Service: Vascular;  Laterality: Left;  .  ULTRASOUND GUIDANCE FOR VASCULAR ACCESS  02/20/2018   Procedure: Ultrasound Guidance For Vascular Access;  Surgeon: Jettie Booze, MD;  Location: [facility] CV LAB;  Service: Cardiovascular;;  . UPPER EXTREMITY VENOGRAPHY N/A 07/08/2018   Procedure: UPPER EXTREMITY VENOGRAPHY;  Surgeon: Waynetta Sandy, MD;  Location: [place] [facility] CV LAB;  Service: Cardiovascular;  Laterality: N/A;  Bilateral  . US ECHOCARDIOGRAPHY  02/06/2012   mild LVH,LA mod. dilated,mild-mod. 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 AI.        Home Medications    Prior to Admission medications   Medication Sig Start Date End Date Taking? Authorizing Provider  acetaminophen (TYLENOL) 500 MG tablet Take 1,000 mg by mouth every 6 (six) hours as needed for mild pain.    [provider]  allopurinol (ZYLOPRIM) 100 MG tablet Take 100 mg by mouth every evening.  06/27/18   [provider]  amiodarone (PACERONE) 200 MG tablet TAKE 1  TABLET(200 MG) BY MOUTH DAILY 10/29/18   Croitoru, Mihai, MD  AURYXIA 1 GM 210 MG(Fe) tablet Take 420 mg by mouth 3 (three) times daily with meals.  08/05/18   [provider]  brimonidine (ALPHAGAN) 0.15 % ophthalmic solution Place 1 drop into the left eye 2 (two) times daily. 11/15/17   [provider]  carvedilol (COREG) 3.125 MG tablet Take 1 tablet (3.125 mg total) by mouth as directed. Take twice a day on Monday, Wednesday, Friday, Sunday. 10/23/18 10/18/19  Lavina Hamman, MD  DESCOVY 200-25 MG tablet Take 1 tablet by mouth every morning. 01/20/18   [provider]  Dextromethorphan-guaiFENesin (CORICIDIN HBP CONGESTION/COUGH PO) Take 1 tablet by mouth every 8 (eight) hours as needed (cold symptoms).    [provider]  dorzolamide-timolol (COSOPT) 22.3-6.8 MG/ML ophthalmic solution Place 1 drop into the left eye 2 (two) times daily. 12/17/17   [provider]  latanoprost (XALATAN) 0.005 % ophthalmic solution Place 1 drop into the left eye at bedtime.     [provider]  midodrine (PROAMATINE) 10 MG tablet Take 1 tablet (10 mg total) by mouth as directed. Twice a day on the day of HD Tuesday, Thursday, Saturday. 10/23/18   Lavina Hamman, MD  montelukast (SINGULAIR) 10 MG tablet Take 1 tablet (10 mg total) by mouth at bedtime. 08/12/18   [name], NP  multivitamin (RENA-VIT) TABS tablet Take 1 tablet by mouth daily.    [provider]  omeprazole (PRILOSEC) 40 MG capsule Take 1 capsule (40 mg total) by mouth daily. Patient not taking: Reported on 10/22/2018 08/28/18   Shelda Pal, DO  TIVICAY 50 MG tablet Take 50 mg by mouth every morning. 01/20/18   [provider]  Vitamin D, Ergocalciferol, (DRISDOL) 50000 UNITS CAPS capsule Take 50,000 Units by mouth every Friday.  04/10/15   [provider]  warfarin (COUMADIN) 5 MG tablet TAKE 1 TO 1 AND 1/2 TABLETS BY MOUTH DAILY AS DIRECTED BY COUMADIN  CLINIC Patient taking differently: Take 5 mg by mouth daily at 6 PM.  10/21/18   Croitoru, Mihai, MD    Family History Family History  Problem Relation Age of Onset  . Hypertension Mother   . Thyroid disease Mother   . Cholelithiasis Daughter   . Cholelithiasis Son   . Hypertension Maternal Grandmother   . Diabetes Maternal Grandmother   . Heart attack Neg Hx   . Hyperlipidemia Neg Hx     Social History Social History  Tobacco Use  . Smoking status: Former Smoker    Packs/day: 1.00    Years: 30.00    Pack years: 30.00    Types: Cigarettes    Last attempt to quit: 2006    Years since quitting: 13.9  . Smokeless tobacco: Never Used  Substance Use Topics  . Alcohol use: Yes    Comment: once a week-wine  . Drug use: Never     Allergies   Cozaar [losartan potassium]   Review of Systems Review of Systems  Constitutional: Negative for activity change, appetite change and fever.  HENT: Negative for congestion.   Respiratory: Positive for cough and shortness of breath.   Cardiovascular: Negative for chest pain.  Gastrointestinal: Positive for nausea and vomiting.  Genitourinary: Negative for dysuria and penile pain.  Musculoskeletal: Negative for arthralgias and myalgias.  Skin: Negative for rash.  Neurological: Negative for dizziness, weakness, light-headedness and headaches.    all other systems are negative except as noted in the HPI and PMH.    Physical Exam Updated Vital Signs BP (!) 168/95 (BP Location: Left Arm)   Pulse (!) 131   Temp 98.5 F (36.9 C) (Oral)   Resp (!) 32   SpO2 94%   Physical Exam  Constitutional: He is oriented to person, place, and time. He appears well-developed and well-nourished. He appears distressed.  Moderate respiratory distress, dry heaving on arrival  HENT:  Head: Normocephalic and atraumatic.  Mouth/Throat: Oropharynx is clear and moist. No oropharyngeal exudate.  Eyes: Pupils are equal, round, and reactive to light.  Conjunctivae and EOM are normal.  Neck: Normal range of motion. Neck supple.  No meningismus.  Cardiovascular: Normal rate, regular rhythm, normal heart sounds and intact distal pulses.  No murmur heard. Pulmonary/Chest: Effort normal. No respiratory distress. He has rales.  Crackles throughout  Abdominal: Soft. There is no tenderness. There is no rebound and no guarding.  Musculoskeletal: Normal range of motion. He exhibits no edema or tenderness.  Neurological: He is alert and oriented to person, place, and time. No cranial nerve deficit. He exhibits normal muscle tone. Coordination normal.  No ataxia on finger to nose bilaterally. No pronator drift. 5/5 strength throughout. CN 2-12 intact.Equal grip strength. Sensation intact.   Skin: Skin is warm. Capillary refill takes less than 2 seconds. No rash noted.  Psychiatric: He has a normal mood and affect. His behavior is normal.  Nursing note and vitals reviewed.    ED Treatments / Results  Labs (all labs ordered are listed, but only abnormal results are displayed) Labs Reviewed  PROTIME-INR - Abnormal; Notable for the following components:      Result Value   Prothrombin Time 22.8 (*)    All other components within normal limits  CBC WITH DIFFERENTIAL/PLATELET - Abnormal; Notable for the following components:   RBC 3.72 (*)    Hemoglobin 12.7 (*)    MCV 110.8 (*)    MCH 34.1 (*)    All other components within normal limits  COMPREHENSIVE METABOLIC PANEL - Abnormal; Notable for the following components:   Potassium 5.2 (*)    Glucose, Bld 131 (*)    BUN 64 (*)    Creatinine, Ser 10.14 (*)    GFR calc non Af Amer 4 (*)    GFR calc Af Amer 5 (*)    Anion gap 17 (*)    All other components within normal limits  TROPONIN I - Abnormal; Notable for the following components:   Troponin I 0.09 (*)  All other components within normal limits  I-STAT CHEM 8, ED - Abnormal; Notable for the following components:   Potassium 5.3 (*)      BUN 62 (*)    Creatinine, Ser 11.10 (*)    Glucose, Bld 130 (*)    Calcium, Ion 1.04 (*)    All other components within normal limits  BRAIN NATRIURETIC PEPTIDE  I-STAT TROPONIN, ED    EKG EKG Interpretation  Date/Time:  Friday November 01 2018 04:54:09 EST Ventricular Rate:  117 PR Interval:    QRS Duration: 137 QT Interval:  347 QTC Calculation: 485 R Axis:   -59 Text Interpretation:  Sinus or ectopic atrial tachycardia Probable left atrial enlargement Left bundle branch block No significant change was found Confirmed by Ezequiel Essex [phone]) on 11/01/2018 5:01:27 AM   Radiology Dg Chest Portable 1 View  Result Date: 11/01/2018 CLINICAL DATA:  Initial evaluation for acute shortness of breath. EXAM: PORTABLE CHEST 1 VIEW COMPARISON:  Prior radiograph from 10/21/2018. FINDINGS: Dual lumen right-sided hemodialysis catheter in place, stable. Moderate cardiomegaly, unchanged. Mediastinal silhouette normal. Lungs normally inflated. Diffuse interstitial prominence with scattered Kerley B-lines at the lung bases, compatible with pulmonary interstitial edema. No large pleural effusion. Superimposed mild left basilar atelectasis. No other focal infiltrates. No pneumothorax. No acute osseus abnormality. Degenerative changes noted about the shoulders. IMPRESSION: 1. Cardiomegaly with mild diffuse pulmonary interstitial edema. 2. Superimposed mild left basilar atelectasis. Electronically Signed   By: Jeannine Boga M.D.   On: 11/01/2018 05:22    Procedures Procedures (including critical care time)  Medications Ordered in ED Medications  nitroGLYCERIN 50 mg in dextrose 5 % 250 mL (0.2 mg/mL) infusion (has no administration in time range)  ondansetron (ZOFRAN-ODT) disintegrating tablet 4 mg (4 mg Oral Given 11/01/18 0500)     Initial Impression / Assessment and Plan / ED Course  I have reviewed the triage vital signs and the nursing notes.  Pertinent labs & imaging results  that were available during my care of the patient were reviewed by me and considered in my medical decision making (see chart for details).    Patient with likely pulmonary edema.  Due for dialysis later this morning.  Dry heaving on arrival.  EKG shows unchanged left bundle branch block. No chest pain, back pain or abdominal pain.  Started on IV nitroglycerin and given antiemetics.  Suspect his vomiting is likely more due to pulmonary edema than true emesis.  We will hold BiPAP at this time until emesis is more controlled.  Patient improving on nasal cannula.  Continue IV nitroglycerin.  His respiratory status is stable on 2 L nasal cannula.  Potassium 5.3.  X-ray consistent with pulmonary edema.  Discussed with Dr. Jimmy Footman that patient will need dialysis today. Admission d/w Dr. Blaine Hamper.   CRITICAL CARE Performed by: Ezequiel Essex Total critical care time: 33 minutes Critical care time was exclusive of separately billable procedures and treating other patients. Critical care was necessary to treat or prevent imminent or life-threatening deterioration. Critical care was time spent personally by me on the following activities: development of treatment plan with patient and/or surrogate as well as nursing, discussions with consultants, evaluation of patient's response to treatment, examination of patient, obtaining history from patient or surrogate, ordering and performing treatments and interventions, ordering and review of laboratory studies, ordering and review of radiographic studies, pulse oximetry and re-evaluation of patient's condition.   Final Clinical Impressions(s) / ED Diagnoses   Final diagnoses:  None  ED Discharge Orders    None       Mikena Masoner, Annie Main, MD 11/01/18 0630

## 2018-11-01 NOTE — Procedures (Signed)
Pt seen and examined and agree w/ A/P as per progress notes.    I was present at this dialysis session, have reviewed the session itself and made  appropriate changes Kelly Splinter MD [place] pager [phone]   11/01/2018, 1:43 PM

## 2018-11-01 NOTE — Progress Notes (Addendum)
Mr. Plascencia is a 77 year old gentleman with ESRD, afib on coumadin, HTN, EF 15% (March 2019) , hx CAD/cardiac arrest, HIV, prostate cancer on TTS dialysis at [facility] who presented with SOB at rest while lying on the couchearlier today. HE denies SOB.  He is compliant with dialysis and last attended Wednesday running his full time and attaining his EDW of 84.with a net UF of 2.9.  He does not think he over ate yesterday - he had mac and cheese, dressing, potatoes and no [place]. He believes his appetite is good. He states he is on a 3 cup a day fluid restriction. He was recently d/c 11/20 following an admission for acute respiratory failure with hypoxia due to pulmonary edema. EDW was lowered 4 kg to 84. BP was controlled with volume control and the addition of amlodipine. He has been admitted to OBS status. Will dialyze today for extra treatment due to volume and back on schedule Saturday if not discharged.  Labs:WBC 8.9  K 5.2 hgb 12.7 BNP 556 Trop 0.09 EKG no acute change ST. Ot sats were ok - CXR showed pulmonary edeam crackles bilaterally on lung exam - no LE edema  Outpatient HD Orders: [facility] 4 hr EDW 84 400/600 EDW 2K 2 Ca profile 2 heparin 5800 Mircera 60 q 2 calctiriol 0.25, TDC and maturing R upper AVF  Plan:  HD today and again tomorrow if not d/c.  Full consult if admitted to inpatient status.  Amalia Hailey, PA-C [facility]  Pt seen, examined and agree w A/P as above.  Kelly Splinter MD [employer] 11/01/2018, 1:44 PM

## 2018-11-01 NOTE — Care Management (Addendum)
This is a no charge note  Pending admission per Dr. Wyvonnia Dusky  77 year old man with past medical history of ESRD-HD (TTS), A fib on coumadin, hypertension, hyperlipidemia, TIA, gout, CHF with EF of 15%, cardiac arrest, CAD, HIV, prostate cancer, who presents with acute respiratory failure, most likely due to pulmonary edema. His HD schedule is changed in holiday season.  He is due for dialysis today. NTG drop is started. Dr. Jimmy Footman of renal was consulted for HD. Pt is placed on SDU for obs.    Ivor Costa, MD  [facility] Pager [phone]  If 7PM-7AM, please contact night-coverage [email or web address] Password [facility] 11/01/2018, 6:17 AM

## 2018-11-01 NOTE — H&P (Signed)
History and Physical    [facility] TIW:[id number] DOB: [birth year] DOA: 11/01/2018  PCP: [name], NP Consultants:  Gail Kidney; Croitoru - cardiology; Donzetta Matters - vascular Patient coming from:  Home - lives with wife; NOK: Wife, [phone]  Chief Complaint:  Respiratory failure  HPI: David [name] is a 77 y.o. male with medical history significant of ESRD-HD (TTS), A fib on coumadin, hypertension, hyperlipidemia, TIA, gout, CHF with EF of 15%, cardiac arrest, CAD, HIV, prostate cancer, who presents with acute respiratory failure, most likely due to pulmonary edema.  He developed SOB overnight.  He usually does HD on TTS and it was changed to MW this week.  He was due to go back on Saturday to get back on schedule.  No cough, no fever.  He was here 2 weeks ago and had his BP medications changed at that time.  He is compliant with HD.   ED Course:  ESRD with changed HD schedule due to the holiday, presenting with volume overload.  Also with EF 10%, HIV.  Due for HD today, volume overloaded.  Not on BIPAP.  K+ 5.6.  CXR with edema.  Started on NTG drip.    Review of Systems: As per HPI; otherwise review of systems reviewed and negative.   Ambulatory Status  Ambulates with a cane  Past Medical History:  Diagnosis Date  . Acute on chronic systolic and diastolic heart failure, NYHA class 4 ([place])   . Anemia, iron deficiency 11/15/2011  . Arthritis    "hands, right knee, feet" (02/21/2018)  . Atrial fibrillation ([place])   . Cancer [facility])    hx of prostate; s/p radioactive seed implant 10/2009 Dr Janice Norrie  . Cardiac arrest ([place]) 02/17/2018  . CHF (congestive heart failure) ([place])   . Chronic lower back pain   . CKD (chronic kidney disease) stage V requiring chronic dialysis ([place])   . ESRD on dialysis ([place])    started 02/2018  . GERD (gastroesophageal reflux disease)   . Glaucoma   . Gout    daily RX (02/21/2018)  . Heart murmur    "mild" per pt  . Hepatitis    years  ago  . History of cardiac cath 2004   negative for CAD  . History of myocardial perfusion scan 02/2010   negative for coronary insufficiency (LVEF 27%)  . HIV (human immunodeficiency virus infection) ([place])   . HIV infection ([place])   . Hyperlipidemia   . Hypertension    followed by [facility] and Vascular (Dr Dani Gobble Croitoru)  . Hypertension   . Nonischemic cardiomyopathy ([place])   . Pneumonia 11/2017  . Prostate cancer ([place])   . Sinus bradycardia   . Sleep apnea    does not use a cpap  . Stroke [facility])    "mini stroke" years ago  . Systolic and diastolic CHF, acute ([place]) 06/2010   felt to be secondary to hypertensive cardiomyopathy    Past Surgical History:  Procedure Laterality Date  . AV FISTULA PLACEMENT Right 10/13/2016   Procedure: ARTERIOVENOUS (AV) FISTULA CREATION;  Surgeon: [name], MD;  Location: [place];  Service: Vascular;  Laterality: Right;  . AV FISTULA PLACEMENT Right 10/13/2016   [name] [phone]  . AV FISTULA PLACEMENT Left 02/25/2018   Procedure: INSERTION OF ARTERIOVENOUS (AV) GORE-TEX GRAFT LEFT UPPER ARM;  Surgeon: Angelia Mould, MD;  [facility];  Service: Vascular;  Laterality: Left;  . AV FISTULA PLACEMENT Right 08/07/2018   Procedure: Creation of  right arm brachiocephalic Fistula;  Surgeon: Waynetta Sandy, MD;  Location: [place];  Service: Vascular;  Laterality: Right;  . BASCILIC VEIN TRANSPOSITION Left 06/24/2015   Procedure: BASILIC VEIN TRANSPOSITION;  Surgeon: Mal Misty, MD;  Location: [place];  Service: Vascular;  Laterality: Left;  . BASCILIC VEIN TRANSPOSITION Left 6/60/6301 Bascilic vein transposition (Left)   [name] [phone]  . CARDIAC CATHETERIZATION  02/04/2003   mildly depressed LV systolic fx EF 57%,DUKGUR coronaries/abdominal aorta/renal arteries.  Marland Kitchen CARDIAC CATHETERIZATION  2004  . CATARACT EXTRACTION, BILATERAL Bilateral   . COLONOSCOPY    . ESOPHAGOGASTRODUODENOSCOPY (EGD) WITH PROPOFOL N/A 05/05/2014   Procedure:  ESOPHAGOGASTRODUODENOSCOPY (EGD) WITH PROPOFOL;  Surgeon: Missy Sabins, MD;  Location: [facility];  Service: Endoscopy;  Laterality: N/A;  . EYE SURGERY Bilateral    cataract surgery   . EYE SURGERY Bilateral    glaucoma surgery  . EYE SURGERY    . GLAUCOMA SURGERY Bilateral   . INSERTION OF ARTERIOVENOUS (AV) ARTEGRAFT ARM  10/09/2018   Procedure: INSERTION OF ARTERIOVENOUS (AV) 59mm x 41cm ARTEGRAFT LEFT UPPER ARM;  Surgeon: Waynetta Sandy, MD;  Location: [place];  Service: Vascular;;  . INSERTION PROSTATE RADIATION SEED    . IR FLUORO GUIDE CV LINE RIGHT  02/18/2018  . IR US GUIDE VASC ACCESS RIGHT  02/18/2018  . LEFT HEART CATH AND CORONARY ANGIOGRAPHY N/A 02/20/2018   Procedure: LEFT HEART CATH AND CORONARY ANGIOGRAPHY;  Surgeon: Jettie Booze, MD;  Location: [place] CV LAB;  Service: Cardiovascular;  Laterality: N/A;  . NM MYOCAR PERF WALL MOTION  02/21/2010   normal  . RADIOACTIVE SEED IMPLANT  2010   prostate cancer  . THROMBECTOMY W/ EMBOLECTOMY Left 02/26/2018   Procedure: THROMBECTOMY ARTERIOVENOUS GRAFT;  Surgeon: Angelia Mould, MD;  Location: [place];  Service: Vascular;  Laterality: Left;  . ULTRASOUND GUIDANCE FOR VASCULAR ACCESS  02/20/2018   Procedure: Ultrasound Guidance For Vascular Access;  Surgeon: Jettie Booze, MD;  Location: [place] CV LAB;  Service: Cardiovascular;;  . UPPER EXTREMITY VENOGRAPHY N/A 07/08/2018   Procedure: UPPER EXTREMITY VENOGRAPHY;  Surgeon: Waynetta Sandy, MD;  Location: [place] CV LAB;  Service: Cardiovascular;  Laterality: N/A;  Bilateral  . US ECHOCARDIOGRAPHY  02/06/2012   mild LVH,LA mod. dilated,mild-mod. 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 AI.    Social History   Socioeconomic History  . Marital status: Married    Spouse name: Not on file  . Number of children: 2  . Years of education: 72  . Highest education level: Not on file  Occupational History  .  Occupation: retired, picks up Aeronautical engineer: RETIRED  Social Needs  . Financial resource strain: Not on file  . Food insecurity:    Worry: Not on file    Inability: Not on file  . Transportation needs:    Medical: Not on file    Non-medical: Not on file  Tobacco Use  . Smoking status: Former Smoker    Packs/day: 1.00    Years: 30.00    Pack years: 30.00    Types: Cigarettes    Last attempt to quit: 2006    Years since quitting: 13.9  . Smokeless tobacco: Never Used  Substance and Sexual Activity  . Alcohol use: Yes    Comment: once a week-wine  . Drug use: Never  . Sexual activity: Not Currently  Lifestyle  . Physical activity:  Days per week: Not on file    Minutes per session: Not on file  . Stress: Not on file  Relationships  . Social connections:    Talks on phone: Not on file    Gets together: Not on file    Attends religious service: Not on file    Active member of club or organization: Not on file    Attends meetings of clubs or organizations: Not on file    Relationship status: Not on file  . Intimate partner violence:    Fear of current or ex partner: Not on file    Emotionally abused: Not on file    Physically abused: Not on file    Forced sexual activity: Not on file  Other Topics Concern  . Not on file  Social History Narrative   ** Merged History Encounter **       Stays active at home Regular exercise: no Drinks 2 cups of coffee a week, 1 mountain dew soda a day.    Allergies  Allergen Reactions  . Cozaar [Losartan Potassium] Other (See Comments)    Causes constipation     Family History  Problem Relation Age of Onset  . Hypertension Mother   . Thyroid disease Mother   . Cholelithiasis Daughter   . Cholelithiasis Son   . Hypertension Maternal Grandmother   . Diabetes Maternal Grandmother   . Heart attack Neg Hx   . Hyperlipidemia Neg Hx     Prior to Admission medications   Medication Sig Start Date End Date Taking?  Authorizing Provider  acetaminophen (TYLENOL) 500 MG tablet Take 1,000 mg by mouth every 6 (six) hours as needed for mild pain.   Yes [provider]  allopurinol (ZYLOPRIM) 100 MG tablet Take 100 mg by mouth every evening.  06/27/18  Yes [provider]  amiodarone (PACERONE) 200 MG tablet TAKE 1 TABLET(200 MG) BY MOUTH DAILY Patient taking differently: Take 200 mg by mouth daily.  10/29/18  Yes Croitoru, Mihai, MD  AURYXIA 1 GM 210 MG(Fe) tablet Take 420 mg by mouth 3 (three) times daily with meals.  08/05/18  Yes [provider]  brimonidine (ALPHAGAN) 0.15 % ophthalmic solution Place 1 drop into the left eye 2 (two) times daily. 11/15/17  Yes [provider]  carvedilol (COREG) 3.125 MG tablet Take 1 tablet (3.125 mg total) by mouth as directed. Take twice a day on Monday, Wednesday, Friday, Sunday. 10/23/18 10/18/19 Yes Lavina Hamman, MD  DESCOVY 200-25 MG tablet Take 1 tablet by mouth daily.  01/20/18  Yes [provider]  Dextromethorphan-guaiFENesin (CORICIDIN HBP CONGESTION/COUGH PO) Take 1 tablet by mouth every 8 (eight) hours as needed (cold symptoms).   Yes [provider]  dorzolamide-timolol (COSOPT) 22.3-6.8 MG/ML ophthalmic solution Place 1 drop into the left eye 2 (two) times daily. 12/17/17  Yes [provider]  latanoprost (XALATAN) 0.005 % ophthalmic solution Place 1 drop into the left eye at bedtime.    Yes [provider]  midodrine (PROAMATINE) 10 MG tablet Take 1 tablet (10 mg total) by mouth as directed. Twice a day on the day of HD Tuesday, Thursday, Saturday. 10/23/18  Yes Lavina Hamman, MD  montelukast (SINGULAIR) 10 MG tablet Take 1 tablet (10 mg total) by mouth at bedtime. 08/12/18  Yes [name], NP  multivitamin (RENA-VIT) TABS tablet Take 1 tablet by mouth daily.   Yes [provider]  TIVICAY 50 MG tablet Take 50 mg by mouth daily.  01/20/18  Yes [provider]  Vitamin  D, Ergocalciferol, (DRISDOL) 50000 UNITS CAPS capsule Take 50,000 Units by mouth every Friday.  04/10/15  Yes [provider]  warfarin (COUMADIN) 5 MG tablet TAKE 1 TO 1 AND 1/2 TABLETS BY MOUTH DAILY AS DIRECTED BY COUMADIN CLINIC Patient taking differently: Take 10 mg by mouth daily at 6 PM.  10/21/18  Yes Croitoru, Mihai, MD  omeprazole (PRILOSEC) 40 MG capsule Take 1 capsule (40 mg total) by mouth daily. Patient not taking: Reported on 10/22/2018 08/28/18   Shelda Pal, DO    Physical Exam: Vitals:   11/01/18 0800 11/01/18 0830 11/01/18 0915 11/01/18 1001  BP: 136/73 (!) 145/79 (!) 144/75 139/78  Pulse: 80 87 79 81  Resp: 18 15 19 16   Temp:    98.2 F (36.8 C)  TempSrc:    Oral  SpO2: 99% 99% 100% 98%     General:  Appears calm and comfortable and is NAD Eyes:  PERRL, EOMI, normal lids, iris ENT:  grossly normal hearing, lips & tongue, mmm; sore tooth with cap with plan for removal in about 2 weeks Neck:  no LAD, masses or thyromegaly Cardiovascular:  RRR, no m/r/g. No LE edema.  Respiratory:  Mild bibasilar crackles.  Normal respiratory effort. Abdomen:  soft, NT, ND, NABS Back:   normal alignment, no CVAT Skin:  no rash or induration seen on limited exam Musculoskeletal:  grossly normal tone BUE/BLE, good ROM, no bony abnormality Lower extremity:  No LE edema.  Limited foot exam with no ulcerations.  2+ distal pulses. Psychiatric:  grossly normal mood and affect, speech fluent and appropriate, AOx3 Neurologic:  CN 2-12 grossly intact, moves all extremities in coordinated fashion, sensation intact    Radiological Exams on Admission: Dg Chest Portable 1 View  Result Date: 11/01/2018 CLINICAL DATA:  Initial evaluation for acute shortness of breath. EXAM: PORTABLE CHEST 1 VIEW COMPARISON:  Prior radiograph from 10/21/2018. FINDINGS: Dual lumen right-sided hemodialysis catheter in place, stable. Moderate cardiomegaly, unchanged. Mediastinal silhouette  normal. Lungs normally inflated. Diffuse interstitial prominence with scattered Kerley B-lines at the lung bases, compatible with pulmonary interstitial edema. No large pleural effusion. Superimposed mild left basilar atelectasis. No other focal infiltrates. No pneumothorax. No acute osseus abnormality. Degenerative changes noted about the shoulders. IMPRESSION: 1. Cardiomegaly with mild diffuse pulmonary interstitial edema. 2. Superimposed mild left basilar atelectasis. Electronically Signed   By: Jeannine Boga M.D.   On: 11/01/2018 05:22    EKG: Independently reviewed.  Sinus tachycardia with rate 117; LBBB with NSCSLT   Labs on Admission: I have personally reviewed the available labs and imaging studies at the time of the admission.  Pertinent labs:   K+ 5.2 Glucose 131 BUN 64/Creatinine 10/14/GFR 5 BNP 556.2 Troponin 0.09 WBC 8.9 Hgb 12.7 INR 2.04  Assessment/Plan Principal Problem:   Acute respiratory failure with hypoxia (HCC) Active Problems:   HYPERGLYCEMIA   ESRD on hemodialysis (HCC)   AIDS (HCC)   Chronic obstructive pulmonary disease (HCC)   Chronic anticoagulation   Acute on chronic combined systolic and diastolic CHF (congestive heart failure) (HCC)   Volume overload   Acute respiratory failure with hypoxia related to volume overload, ESRD on HD -Patient on chronic TTS HD but this schedule was changed this week due to the Thanksgiving holiday -He developed acute respiratory distress overnight  -He was given Lasix and appears to be improved -He is currently on [place] O2 and appears more comfortable -Will need urgent HD;  nephrology has been consulted and is arranging -Nephrology prn order set utilized -He may require an additional session of HD tomorrow but may be appropriate for d/c to home tomorrow either before or after HD -He does have baseline anemia which appears to be stable -Continue Phoslo -Will observe for now -Bed request to SDU while in the ER;  after HD, but he appears to be appropriate for transfer to telemetry - will order  Acute on chronic combined heart failure -EF 15-20% with grade 1 diastolic dysfunction in 2/37 -Last seen by [name] on 7/8 and "not a good candidate for conventional ICD with transvenous leads due to dialysis."  SubQ ICD could be considered but this may not affect his long-term prognosis.  Afib on Coumadin -Rate controlled on Amiodarone, Coreg -Continue Coumadin, dosing per pharmacy  AIDS -Continue Descovy, Tivicay -CD4=380 and VL<20 on 06/19/18  COPD -BronchodilatorsandSingulair -Mucinex for cough  Hyperglycemia -A1c 6.1 in 2/19 -May be stress response -Will follow with fasting AM labs -It is unlikely that he will need acute or chronic treatment for this issue  HTN -Started on NTG drip while in the ER, okay to titrate off at this time -Resume Coreg; also take Midodrine with HD -He was started on Norvasc during last hospitalization (d/c 11/20) but he does not appear to be taking this at this time    DVT prophylaxis: Coumadin Code Status:  Full - confirmed with patient Family Communication: None present  Disposition Plan:  Home once clinically improved Consults called: Nephrology Admission status: It is my clinical opinion that referral for OBSERVATION is reasonable and necessary in this patient based on the above information provided. The aforementioned taken together are felt to place the patient at high risk for further clinical deterioration. However it is anticipated that the patient may be medically stable for discharge from the hospital within 24 to 48 hours.   Karmen Bongo MD [facility]  If note is complete, please contact covering daytime or nighttime physician. [email or web address] Password [email or web address]  11/01/2018, 11:59 AM

## 2018-11-01 NOTE — ED Triage Notes (Signed)
Pt having sob at home. Dialysis pt with hx of chf. spo2 86% on room air pta.

## 2018-11-02 DIAGNOSIS — N186 End stage renal disease: Secondary | ICD-10-CM | POA: Diagnosis not present

## 2018-11-02 DIAGNOSIS — I5043 Acute on chronic combined systolic (congestive) and diastolic (congestive) heart failure: Secondary | ICD-10-CM | POA: Diagnosis not present

## 2018-11-02 DIAGNOSIS — B20 Human immunodeficiency virus [HIV] disease: Secondary | ICD-10-CM | POA: Diagnosis not present

## 2018-11-02 DIAGNOSIS — N2581 Secondary hyperparathyroidism of renal origin: Secondary | ICD-10-CM | POA: Diagnosis not present

## 2018-11-02 DIAGNOSIS — Z992 Dependence on renal dialysis: Secondary | ICD-10-CM | POA: Diagnosis not present

## 2018-11-02 DIAGNOSIS — J9601 Acute respiratory failure with hypoxia: Secondary | ICD-10-CM | POA: Diagnosis not present

## 2018-11-02 LAB — HEPATITIS B SURFACE ANTIGEN: Hepatitis B Surface Ag: NEGATIVE

## 2018-11-02 LAB — CBC
HCT: 40.8 % (ref 39.0–52.0)
HEMOGLOBIN: 12.4 g/dL — AB (ref 13.0–17.0)
MCH: 33 pg (ref 26.0–34.0)
MCHC: 30.4 g/dL (ref 30.0–36.0)
MCV: 108.5 fL — ABNORMAL HIGH (ref 80.0–100.0)
Platelets: 290 10*3/uL (ref 150–400)
RBC: 3.76 MIL/uL — ABNORMAL LOW (ref 4.22–5.81)
RDW: 14.5 % (ref 11.5–15.5)
WBC: 8.1 10*3/uL (ref 4.0–10.5)
nRBC: 0.4 % — ABNORMAL HIGH (ref 0.0–0.2)

## 2018-11-02 LAB — BASIC METABOLIC PANEL
Anion gap: 16 — ABNORMAL HIGH (ref 5–15)
BUN: 42 mg/dL — ABNORMAL HIGH (ref 8–23)
CO2: 27 mmol/L (ref 22–32)
Calcium: 9.3 mg/dL (ref 8.9–10.3)
Chloride: 94 mmol/L — ABNORMAL LOW (ref 98–111)
Creatinine, Ser: 8.03 mg/dL — ABNORMAL HIGH (ref 0.61–1.24)
GFR calc Af Amer: 7 mL/min — ABNORMAL LOW (ref >60)
GFR calc non Af Amer: 6 mL/min — ABNORMAL LOW (ref >60)
Glucose, Bld: 100 mg/dL — ABNORMAL HIGH (ref 70–99)
Potassium: 5.2 mmol/L — ABNORMAL HIGH (ref 3.5–5.1)
Sodium: 137 mmol/L (ref 135–145)

## 2018-11-02 LAB — PROTIME-INR
INR: 2.21
Prothrombin Time: 24.2 seconds — ABNORMAL HIGH (ref 11.4–15.2)

## 2018-11-02 LAB — HEPATITIS B SURFACE ANTIBODY, QUANTITATIVE: Hep B S AB Quant (Post): 233.8 m[IU]/mL (ref >9.9)

## 2018-11-02 LAB — HEPATITIS B CORE ANTIBODY, TOTAL: Hep B Core Total Ab: NEGATIVE

## 2018-11-02 NOTE — Progress Notes (Signed)
Patient discharged, peripheral IV removed, AVS reviewed including medications and appointments, pt and spouse verbalized understanding.  All questions and concerns addressed.  Escorted pt down via wheelchair for discharge. Vital signs were obtained prior to discharge and were stable.

## 2018-11-02 NOTE — Progress Notes (Signed)
Much improved-not short of breath-O2 sat in the high 90s on room air.  Spoke with Dr. Rowe Clack MD on call-and then spoke with patient's outpatient HD center-they can accommodate patient after 11:30 AM today.  Patient agreeable with this plan-he will discharge from the hospital and follow-up at his outpatient HD center today for HD.

## 2018-11-02 NOTE — Discharge Summary (Signed)
PATIENT DETAILS Name: David [name] Age: 77 y.o. Sex: male Date of Birth: 1941/01/30 MRN: [id number]. Admitting Physician: Ivor Costa, MD PCP:O'Sullivan, Lenna Sciara, NP  Admit Date: 11/01/2018 Discharge date: 11/02/2018  Recommendations for Outpatient Follow-up:  1. Follow up with PCP in 1-2 weeks 2. Please obtain BMP/CBC in one week  Admitted From:  Home  Disposition: [place]: No  Equipment/Devices: None  Discharge Condition: Stable  CODE STATUS: FULL CODE  Diet recommendation:  Heart Healthy   Brief Summary: See H&P, Labs, Consult and Test reports for all details in brief, patient is a 77 year old male with history of ESRD on HD (TTS), A. fib on Coumadin, hypertension, dyslipidemia, chronic systolic heart failure, HIV who presented to the hospital with shortness of breath, patient was found to have acute hypoxic respiratory failure secondary to pulmonary edema.  Patient underwent HD and felt much better.  See below for further details  Brief Hospital Course: Acute hypoxic respiratory failure due to pulmonary edema in the setting of ESRD and chronic systolic heart failure: Clinically improved after HD on 11/29.  On room air this morning-lungs are completely clear.  Spoke with nephrology-who advised that if patient's outpatient HD center can accommodate patient today-okay to be discharged.  Subsequently spoke with outpatient HD center-they can accommodate patient later this afternoon-patient is agreeable with this plan-stable for discharge today.  ESRD: See above-usual schedule is TTS-did receive emergent HD on 11/29 with significant improvement.  Patient to follow-up with his outpatient HD center today for HD at his usual schedule.  Rest of his medical problems were stable during the short overnight observation stay.  Procedures/Studies: None  Discharge Diagnoses:  Principal Problem:   Acute respiratory failure with hypoxia (HCC) Active  Problems:   HYPERGLYCEMIA   ESRD on hemodialysis (HCC)   AIDS (HCC)   Chronic obstructive pulmonary disease (HCC)   Chronic anticoagulation   Acute on chronic combined systolic and diastolic CHF (congestive heart failure) (HCC)   Volume overload   Discharge Instructions:  Activity:  As tolerated  Discharge Instructions    Call MD for:  difficulty breathing, headache or visual disturbances   Complete by:  As directed    Diet - low sodium heart healthy   Complete by:  As directed    Discharge instructions   Complete by:  As directed    Follow with Primary MD  [name], NP  and other consultant's as instructed your Hospitalist MD  Follow at Outpatient HD center today after 11:30 am to resume usual HD   Please get a complete blood count and chemistry panel checked by your Primary MD at your next visit, and again as instructed by your Primary MD.  Get Medicines reviewed and adjusted: Please take all your medications with you for your next visit with your Primary MD  Laboratory/radiological data: Please request your Primary MD to go over all hospital tests and procedure/radiological results at the follow up, please ask your Primary MD to get all Hospital records sent to his/her office.  In some cases, they will be blood work, cultures and biopsy results pending at the time of your discharge. Please request that your primary care M.D. follows up on these results.  Also Note the following: If you experience worsening of your admission symptoms, develop shortness of breath, life threatening emergency, suicidal or homicidal thoughts you must seek medical attention immediately by calling 911 or calling your MD immediately  if symptoms less severe.  You must read  complete instructions/literature along with all the possible adverse reactions/side effects for all the Medicines you take and that have been prescribed to you. Take any new Medicines after you have completely  understood and accpet all the possible adverse reactions/side effects.   Do not drive when taking Pain medications or sleeping medications (Benzodaizepines)  Do not take more than prescribed Pain, Sleep and Anxiety Medications. It is not advisable to combine anxiety,sleep and pain medications without talking with your primary care practitioner  Special Instructions: If you have smoked or chewed Tobacco  in the last 2 yrs please stop smoking, stop any regular Alcohol  and or any Recreational drug use.  Wear Seat belts while driving.  Please note: You were cared for by a hospitalist during your hospital stay. Once you are discharged, your primary care physician will handle any further medical issues. Please note that NO REFILLS for any discharge medications will be authorized once you are discharged, as it is imperative that you return to your primary care physician (or establish a relationship with a primary care physician if you do not have one) for your post hospital discharge needs so that they can reassess your need for medications and monitor your lab values.   Increase activity slowly   Complete by:  As directed      Allergies as of 11/02/2018      Reactions   Cozaar [losartan Potassium] Other (See Comments)   Causes constipation      Medication List    TAKE these medications   acetaminophen 500 MG tablet Commonly known as:  TYLENOL Take 1,000 mg by mouth every 6 (six) hours as needed for mild pain.   allopurinol 100 MG tablet Commonly known as:  ZYLOPRIM Take 100 mg by mouth every evening.   amiodarone 200 MG tablet Commonly known as:  PACERONE TAKE 1 TABLET(200 MG) BY MOUTH DAILY What changed:  See the new instructions.   AURYXIA 1 GM 210 MG(Fe) tablet Generic drug:  ferric citrate Take 420 mg by mouth 3 (three) times daily with meals.   brimonidine 0.15 % ophthalmic solution Commonly known as:  ALPHAGAN Place 1 drop into the left eye 2 (two) times daily.    carvedilol 3.125 MG tablet Commonly known as:  COREG Take 1 tablet (3.125 mg total) by mouth as directed. Take twice a day on Monday, Wednesday, Friday, Sunday.   CORICIDIN HBP CONGESTION/COUGH PO Take 1 tablet by mouth every 8 (eight) hours as needed (cold symptoms).   DESCOVY 200-25 MG tablet Generic drug:  emtricitabine-tenofovir AF Take 1 tablet by mouth daily.   dorzolamide-timolol 22.3-6.8 MG/ML ophthalmic solution Commonly known as:  COSOPT Place 1 drop into the left eye 2 (two) times daily.   latanoprost 0.005 % ophthalmic solution Commonly known as:  XALATAN Place 1 drop into the left eye at bedtime.   midodrine 10 MG tablet Commonly known as:  PROAMATINE Take 1 tablet (10 mg total) by mouth as directed. Twice a day on the day of HD Tuesday, Thursday, Saturday.   montelukast 10 MG tablet Commonly known as:  SINGULAIR Take 1 tablet (10 mg total) by mouth at bedtime.   multivitamin Tabs tablet Take 1 tablet by mouth daily.   TIVICAY 50 MG tablet Generic drug:  dolutegravir Take 50 mg by mouth daily.   Vitamin D (Ergocalciferol) 1.25 MG (50000 UT) Caps capsule Commonly known as:  DRISDOL Take 50,000 Units by mouth every Friday.   warfarin 5 MG tablet Commonly known as:  COUMADIN Take as directed. If you are unsure how to take this medication, talk to your nurse or doctor. Original instructions:  TAKE 1 TO 1 AND 1/2 TABLETS BY MOUTH DAILY AS DIRECTED BY COUMADIN CLINIC What changed:  See the new instructions.      Follow-up Information    [name], NP. Schedule an appointment as soon as possible for a visit in 1 week(s).   Specialty:  Internal Medicine Contact information: [address] [place] [address] [phone]        Tommy Medal, Lavell Islam, MD. Schedule an appointment as soon as possible for a visit in 1 month(s).   Specialty:  Infectious Diseases Contact information: [address] [place]  [address] [phone]        Croitoru, Dani Gobble, MD. Schedule an appointment as soon as possible for a visit in 2 week(s).   Specialty:  Cardiology Contact information: [address] [place] [address] [phone]          Allergies  Allergen Reactions  . Cozaar [Losartan Potassium] Other (See Comments)    Causes constipation     Consultations:   nephrology  Other Procedures/Studies: Dg Chest Portable 1 View  Result Date: 11/01/2018 CLINICAL DATA:  Initial evaluation for acute shortness of breath. EXAM: PORTABLE CHEST 1 VIEW COMPARISON:  Prior radiograph from 10/21/2018. FINDINGS: Dual lumen right-sided hemodialysis catheter in place, stable. Moderate cardiomegaly, unchanged. Mediastinal silhouette normal. Lungs normally inflated. Diffuse interstitial prominence with scattered Kerley B-lines at the lung bases, compatible with pulmonary interstitial edema. No large pleural effusion. Superimposed mild left basilar atelectasis. No other focal infiltrates. No pneumothorax. No acute osseus abnormality. Degenerative changes noted about the shoulders. IMPRESSION: 1. Cardiomegaly with mild diffuse pulmonary interstitial edema. 2. Superimposed mild left basilar atelectasis. Electronically Signed   By: Jeannine Boga M.D.   On: 11/01/2018 05:22   Dg Chest Port 1 View  Result Date: 10/21/2018 CLINICAL DATA:  Shortness of breath. Dialysis patient. History of congestive heart failure, COPD, end-stage renal disease. EXAM: PORTABLE CHEST 1 VIEW COMPARISON:  04/30/2018 FINDINGS: Right central venous catheter with tip over the cavoatrial junction region. No pneumothorax. Cardiac enlargement. Airspace disease in the right mid and lower lungs. This may represent pneumonia or asymmetrical edema. No consolidation on the left. No blunting of costophrenic angles. No pneumothorax. Mediastinal contours appear intact. Degenerative changes in the spine. IMPRESSION: Airspace disease in  the right mid and lower lungs suggesting pneumonia or asymmetrical edema. Cardiac enlargement. Electronically Signed   By: Lucienne Capers M.D.   On: 10/21/2018 22:49      TODAY-DAY OF DISCHARGE:  Subjective:   David [name] today has no headache,no chest abdominal pain,no new weakness tingling or numbness, feels much better wants to go home today.   Objective:   Blood pressure 99/63, pulse 91, temperature 98.1 F (36.7 C), temperature source Oral, resp. rate (!) 22, height 6\' 2"  (1.88 m), weight 80.9 kg, SpO2 100 %.  Intake/Output Summary (Last 24 hours) at 11/02/2018 0812 Last data filed at 11/01/2018 1800 Gross per 24 hour  Intake 125 ml  Output 2800 ml  Net -2675 ml   Filed Weights   11/01/18 1157 11/01/18 1607 11/01/18 1806  Weight: 83.9 kg 80.9 kg 80.9 kg    Exam: Awake Alert, Oriented *3, No new F.N deficits, Normal affect [place].AT,PERRAL Supple Neck,No JVD, No cervical lymphadenopathy appriciated.  Symmetrical Chest wall movement, Good air movement bilaterally, CTAB RRR,No Gallops,Rubs or new  Murmurs, No Parasternal Heave +ve B.Sounds, Abd Soft, Non tender, No organomegaly appriciated, No rebound -guarding or rigidity. No Cyanosis, Clubbing or edema, No new Rash or bruise   PERTINENT RADIOLOGIC STUDIES: Dg Chest Portable 1 View  Result Date: 11/01/2018 CLINICAL DATA:  Initial evaluation for acute shortness of breath. EXAM: PORTABLE CHEST 1 VIEW COMPARISON:  Prior radiograph from 10/21/2018. FINDINGS: Dual lumen right-sided hemodialysis catheter in place, stable. Moderate cardiomegaly, unchanged. Mediastinal silhouette normal. Lungs normally inflated. Diffuse interstitial prominence with scattered Kerley B-lines at the lung bases, compatible with pulmonary interstitial edema. No large pleural effusion. Superimposed mild left basilar atelectasis. No other focal infiltrates. No pneumothorax. No acute osseus abnormality. Degenerative changes noted about the shoulders.  IMPRESSION: 1. Cardiomegaly with mild diffuse pulmonary interstitial edema. 2. Superimposed mild left basilar atelectasis. Electronically Signed   By: Jeannine Boga M.D.   On: 11/01/2018 05:22   Dg Chest Port 1 View  Result Date: 10/21/2018 CLINICAL DATA:  Shortness of breath. Dialysis patient. History of congestive heart failure, COPD, end-stage renal disease. EXAM: PORTABLE CHEST 1 VIEW COMPARISON:  04/30/2018 FINDINGS: Right central venous catheter with tip over the cavoatrial junction region. No pneumothorax. Cardiac enlargement. Airspace disease in the right mid and lower lungs. This may represent pneumonia or asymmetrical edema. No consolidation on the left. No blunting of costophrenic angles. No pneumothorax. Mediastinal contours appear intact. Degenerative changes in the spine. IMPRESSION: Airspace disease in the right mid and lower lungs suggesting pneumonia or asymmetrical edema. Cardiac enlargement. Electronically Signed   By: Lucienne Capers M.D.   On: 10/21/2018 22:49     PERTINENT LAB RESULTS: CBC: Recent Labs    11/01/18 0512 11/01/18 0516 11/02/18 0256  WBC 8.9  --  8.1  HGB 12.7* 14.3 12.4*  HCT 41.2 42.0 40.8  PLT 271  --  290   CMET CMP     Component Value Date/Time   NA 137 11/02/2018 0256   NA 142 04/24/2018 1641   K 5.2 (H) 11/02/2018 0256   CL 94 (L) 11/02/2018 0256   CO2 27 11/02/2018 0256   GLUCOSE 100 (H) 11/02/2018 0256   BUN 42 (H) 11/02/2018 0256   BUN 31 (H) 04/24/2018 1641   CREATININE 8.03 (H) 11/02/2018 0256   CREATININE 7.44 (H) 06/19/2018 0949   CALCIUM 9.3 11/02/2018 0256   CALCIUM 8.6 03/14/2016 0925   PROT 8.1 11/01/2018 0512   PROT 7.9 04/24/2018 1641   ALBUMIN 4.1 11/01/2018 0512   ALBUMIN 5.0 (H) 04/24/2018 1641   AST 31 11/01/2018 0512   ALT 25 11/01/2018 0512   ALKPHOS 118 11/01/2018 0512   BILITOT 0.6 11/01/2018 0512   BILITOT 0.4 04/24/2018 1641   GFRNONAA 6 (L) 11/02/2018 0256   GFRNONAA 6 (L) 06/19/2018 0949    GFRAA 7 (L) 11/02/2018 0256   GFRAA 7 (L) 06/19/2018 0949    GFR Estimated Creatinine Clearance: 9 mL/min (A) (by C-G formula based on SCr of 8.03 mg/dL (H)). No results for input(s): LIPASE, AMYLASE in the last 72 hours. Recent Labs    11/01/18 0512  TROPONINI 0.09*   Invalid input(s): POCBNP No results for input(s): DDIMER in the last 72 hours. No results for input(s): HGBA1C in the last 72 hours. No results for input(s): CHOL, HDL, LDLCALC, TRIG, CHOLHDL, LDLDIRECT in the last 72 hours. No results for input(s): TSH, T4TOTAL, T3FREE, THYROIDAB in the last 72 hours.  Invalid input(s): FREET3 No results for input(s): VITAMINB12, FOLATE, FERRITIN, TIBC, IRON, RETICCTPCT in the last  72 hours. Coags: Recent Labs    11/01/18 0512 11/02/18 0256  INR 2.04 2.21   Microbiology: No results found for this or any previous visit (from the past 240 hour(s)).  FURTHER DISCHARGE INSTRUCTIONS:  Get Medicines reviewed and adjusted: Please take all your medications with you for your next visit with your Primary MD  Laboratory/radiological data: Please request your Primary MD to go over all hospital tests and procedure/radiological results at the follow up, please ask your Primary MD to get all Hospital records sent to his/her office.  In some cases, they will be blood work, cultures and biopsy results pending at the time of your discharge. Please request that your primary care M.D. goes through all the records of your hospital data and follows up on these results.  Also Note the following: If you experience worsening of your admission symptoms, develop shortness of breath, life threatening emergency, suicidal or homicidal thoughts you must seek medical attention immediately by calling 911 or calling your MD immediately  if symptoms less severe.  You must read complete instructions/literature along with all the possible adverse reactions/side effects for all the Medicines you take and that  have been prescribed to you. Take any new Medicines after you have completely understood and accpet all the possible adverse reactions/side effects.   Do not drive when taking Pain medications or sleeping medications (Benzodaizepines)  Do not take more than prescribed Pain, Sleep and Anxiety Medications. It is not advisable to combine anxiety,sleep and pain medications without talking with your primary care practitioner  Special Instructions: If you have smoked or chewed Tobacco  in the last 2 yrs please stop smoking, stop any regular Alcohol  and or any Recreational drug use.  Wear Seat belts while driving.  Please note: You were cared for by a hospitalist during your hospital stay. Once you are discharged, your primary care physician will handle any further medical issues. Please note that NO REFILLS for any discharge medications will be authorized once you are discharged, as it is imperative that you return to your primary care physician (or establish a relationship with a primary care physician if you do not have one) for your post hospital discharge needs so that they can reassess your need for medications and monitor your lab values.  Total Time spent coordinating discharge including counseling, education and face to face time equals 35 minutes.  SignedOren Binet 11/02/2018 8:12 AM

## 2018-11-05 ENCOUNTER — Ambulatory Visit: Payer: Self-pay

## 2018-11-05 DIAGNOSIS — N2581 Secondary hyperparathyroidism of renal origin: Secondary | ICD-10-CM | POA: Diagnosis not present

## 2018-11-05 DIAGNOSIS — N186 End stage renal disease: Secondary | ICD-10-CM | POA: Diagnosis not present

## 2018-11-05 NOTE — Progress Notes (Signed)
Scheduled overdue inr

## 2018-11-07 DIAGNOSIS — N186 End stage renal disease: Secondary | ICD-10-CM | POA: Diagnosis not present

## 2018-11-07 DIAGNOSIS — N2581 Secondary hyperparathyroidism of renal origin: Secondary | ICD-10-CM | POA: Diagnosis not present

## 2018-11-09 DIAGNOSIS — N186 End stage renal disease: Secondary | ICD-10-CM | POA: Diagnosis not present

## 2018-11-09 DIAGNOSIS — N2581 Secondary hyperparathyroidism of renal origin: Secondary | ICD-10-CM | POA: Diagnosis not present

## 2018-11-11 ENCOUNTER — Encounter: Payer: Self-pay | Admitting: Cardiovascular Disease

## 2018-11-11 ENCOUNTER — Ambulatory Visit (INDEPENDENT_AMBULATORY_CARE_PROVIDER_SITE_OTHER): Payer: Medicare HMO | Admitting: Cardiovascular Disease

## 2018-11-11 ENCOUNTER — Ambulatory Visit (INDEPENDENT_AMBULATORY_CARE_PROVIDER_SITE_OTHER): Payer: Medicare HMO | Admitting: Pharmacist Clinician (PhC)/ Clinical Pharmacy Specialist

## 2018-11-11 VITALS — BP 123/65 | HR 76 | Ht 74.0 in | Wt 191.8 lb

## 2018-11-11 DIAGNOSIS — Z79899 Other long term (current) drug therapy: Secondary | ICD-10-CM | POA: Diagnosis not present

## 2018-11-11 DIAGNOSIS — N186 End stage renal disease: Secondary | ICD-10-CM

## 2018-11-11 DIAGNOSIS — Z5181 Encounter for therapeutic drug level monitoring: Secondary | ICD-10-CM

## 2018-11-11 DIAGNOSIS — I5042 Chronic combined systolic (congestive) and diastolic (congestive) heart failure: Secondary | ICD-10-CM

## 2018-11-11 DIAGNOSIS — Z7901 Long term (current) use of anticoagulants: Secondary | ICD-10-CM | POA: Diagnosis not present

## 2018-11-11 DIAGNOSIS — I4891 Unspecified atrial fibrillation: Secondary | ICD-10-CM | POA: Diagnosis not present

## 2018-11-11 DIAGNOSIS — I420 Dilated cardiomyopathy: Secondary | ICD-10-CM

## 2018-11-11 DIAGNOSIS — I447 Left bundle-branch block, unspecified: Secondary | ICD-10-CM | POA: Diagnosis not present

## 2018-11-11 DIAGNOSIS — I482 Chronic atrial fibrillation, unspecified: Secondary | ICD-10-CM | POA: Diagnosis not present

## 2018-11-11 DIAGNOSIS — Z21 Asymptomatic human immunodeficiency virus [HIV] infection status: Secondary | ICD-10-CM | POA: Diagnosis not present

## 2018-11-11 DIAGNOSIS — I480 Paroxysmal atrial fibrillation: Secondary | ICD-10-CM

## 2018-11-11 LAB — POCT INR: INR: 3.3 — AB (ref 2.0–3.0)

## 2018-11-11 NOTE — Patient Instructions (Signed)
Medication Instructions:  Dr Sallyanne Kuster has recommended making the following medication changes: 1. STOP Carvedilol  If you need a refill on your cardiac medications before your next appointment, please call your pharmacy.   Follow-Up: Your physician recommends that you schedule a follow-up appointment in 3 months with a NP/PA.  Dr Sallyanne Kuster recommends that you schedule a follow-up appointment in 6 months. You will receive a reminder letter in the mail two months in advance. If you don't receive a letter, please call our office to schedule the follow-up appointment.

## 2018-11-11 NOTE — Progress Notes (Signed)
Patient ID: David [name], male   DOB: May 16, 1941, 77 y.o.   MRN: [id number]    Cardiology Office Note    Date:  11/16/2018   ID:  [id number] [place], [place] [birth year], MRN [id number]  PCP:  [name], NP  Cardiologist:   [name], MD   No chief complaint on file.   History of Present Illness:  David [name] is a 77 y.o. male with CHF (systolic and diastolic), PAFib, history of PEA arrest, nonischemic dilated cardiomyopathy and ESRD.  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  Most recently his ejection fraction has been estimated to be 15-20%.  Coronary angiography after his cardiac arrest showed only minor atherosclerosis without significant stenotic lesions.  He has an atypical left bundle branch block.   He is HIV positive, with undetectable viral load on antiretroviral therapy, monitored by Dr. Tommy Medal.     He underwent surgical implantation of a left upper arm AV graft on November 6.  Two previous attempts at placing AV fistula access points have failed.  He is getting dialysis via a tunneled right subclavian catheter.  He continues to have a difficult time with volume management and has been hospitalized twice on November 18 November 29 for acute pulmonary edema with acute hypoxic respiratory failure.  Each time a single session of dialysis to resume around.  Dialysis is often interrupted due to hypotension.  His current estimated dry weight is 184 lb/ 84kg.  He is also taking carvedilol 3 days a week on nondialysis days.  He denies palpitations, chest pain, full-blown syncope, edema.  He does not have orthopnea or PND.  He looks quite depressed.  Past Medical History:  Diagnosis Date  . Acute on chronic systolic and diastolic heart failure,  NYHA class 4 ([place])   . Anemia, iron deficiency 11/15/2011  . Arthritis    "hands, right knee, feet" (02/21/2018)  . Atrial fibrillation ([place])   . Cancer [facility])    hx of prostate; s/p radioactive seed implant 10/2009 Dr Janice Norrie  . Cardiac arrest ([place]) 02/17/2018  . CHF (congestive heart failure) ([place])   . Chronic lower back pain   . CKD (chronic kidney disease) stage V requiring chronic dialysis ([place])   . ESRD on dialysis ([place])    started 02/2018  . GERD (gastroesophageal reflux disease)   . Glaucoma   . Gout    daily RX (02/21/2018)  . Heart murmur    "mild" per pt  . Hepatitis    years ago  . History of cardiac cath 2004   negative for CAD  . History of myocardial perfusion scan 02/2010   negative for coronary insufficiency (LVEF 27%)  . HIV (human immunodeficiency virus infection) ([place])   . HIV infection ([place])   . Hyperlipidemia   . Hypertension    followed by [facility] and Vascular (Dr Dani Gobble Arlenne Kimbley)  . Hypertension   . Nonischemic cardiomyopathy ([place])   . Pneumonia 11/2017  . Prostate cancer ([place])   . Sinus bradycardia   . Sleep apnea    does not use a cpap  . Stroke [name])    "mini stroke" years ago  . Systolic and diastolic CHF, acute ([place]) 06/2010   felt to be secondary to hypertensive cardiomyopathy    Past Surgical History:  Procedure Laterality Date  .  AV FISTULA PLACEMENT Right 10/13/2016   Procedure: ARTERIOVENOUS (AV) FISTULA CREATION;  Surgeon: [name], MD;  Location: [place];  Service: Vascular;  Laterality: Right;  . AV FISTULA PLACEMENT Right 10/13/2016   [name] [id number]  . AV FISTULA PLACEMENT Left 02/25/2018   Procedure: INSERTION OF ARTERIOVENOUS (AV) GORE-TEX GRAFT LEFT UPPER ARM;  Surgeon: Angelia Mould, MD;  Location: [place];  Service: Vascular;  Laterality: Left;  . AV FISTULA PLACEMENT Right 08/07/2018   Procedure: Creation of right arm brachiocephalic Fistula;  Surgeon: Waynetta Sandy, MD;  Location: [place];  Service:  Vascular;  Laterality: Right;  . BASCILIC VEIN TRANSPOSITION Left 06/24/2015   Procedure: BASILIC VEIN TRANSPOSITION;  Surgeon: Mal Misty, MD;  Location: [place];  Service: Vascular;  Laterality: Left;  . BASCILIC VEIN TRANSPOSITION Left 0/63/0160 Bascilic vein transposition (Left)   [name] [id number]  . CARDIAC CATHETERIZATION  02/04/2003   mildly depressed LV systolic fx EF 32%,KGURKY coronaries/abdominal aorta/renal arteries.  Marland Kitchen CARDIAC CATHETERIZATION  2004  . CATARACT EXTRACTION, BILATERAL Bilateral   . COLONOSCOPY    . ESOPHAGOGASTRODUODENOSCOPY (EGD) WITH PROPOFOL N/A 05/05/2014   Procedure: ESOPHAGOGASTRODUODENOSCOPY (EGD) WITH PROPOFOL;  Surgeon: Missy Sabins, MD;  Location: [facility];  Service: Endoscopy;  Laterality: N/A;  . EYE SURGERY Bilateral    cataract surgery   . EYE SURGERY Bilateral    glaucoma surgery  . EYE SURGERY    . GLAUCOMA SURGERY Bilateral   . INSERTION OF ARTERIOVENOUS (AV) ARTEGRAFT ARM  10/09/2018   Procedure: INSERTION OF ARTERIOVENOUS (AV) 74mm x 41cm ARTEGRAFT LEFT UPPER ARM;  Surgeon: Waynetta Sandy, MD;  Location: [place];  Service: Vascular;;  . INSERTION PROSTATE RADIATION SEED    . IR FLUORO GUIDE CV LINE RIGHT  02/18/2018  . IR US GUIDE VASC ACCESS RIGHT  02/18/2018  . LEFT HEART CATH AND CORONARY ANGIOGRAPHY N/A 02/20/2018   Procedure: LEFT HEART CATH AND CORONARY ANGIOGRAPHY;  Surgeon: Jettie Booze, MD;  Location: [place] CV LAB;  Service: Cardiovascular;  Laterality: N/A;  . NM MYOCAR PERF WALL MOTION  02/21/2010   normal  . RADIOACTIVE SEED IMPLANT  2010   prostate cancer  . THROMBECTOMY W/ EMBOLECTOMY Left 02/26/2018   Procedure: THROMBECTOMY ARTERIOVENOUS GRAFT;  Surgeon: Angelia Mould, MD;  Location: [place];  Service: Vascular;  Laterality: Left;  . ULTRASOUND GUIDANCE FOR VASCULAR ACCESS  02/20/2018   Procedure: Ultrasound Guidance For Vascular Access;  Surgeon: Jettie Booze, MD;  Location: [place] CV LAB;   Service: Cardiovascular;;  . UPPER EXTREMITY VENOGRAPHY N/A 07/08/2018   Procedure: UPPER EXTREMITY VENOGRAPHY;  Surgeon: Waynetta Sandy, MD;  Location: [place] CV LAB;  Service: Cardiovascular;  Laterality: N/A;  Bilateral  . US ECHOCARDIOGRAPHY  02/06/2012   mild LVH,LA mod. dilated,mild-mod. 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 AI.    Current Outpatient Medications  Medication Sig Dispense Refill  . acetaminophen (TYLENOL) 500 MG tablet Take 1,000 mg by mouth every 6 (six) hours as needed for mild pain.    Marland Kitchen allopurinol (ZYLOPRIM) 100 MG tablet Take 100 mg by mouth every evening.     Marland Kitchen amiodarone (PACERONE) 200 MG tablet TAKE 1 TABLET(200 MG) BY MOUTH DAILY (Patient taking differently: Take 200 mg by mouth daily. ) 90 tablet 1  . AURYXIA 1 GM 210 MG(Fe) tablet Take 420 mg by mouth 3 (three) times daily with meals.     . brimonidine (ALPHAGAN) 0.15 %  ophthalmic solution Place 1 drop into the left eye 2 (two) times daily.  3  . DESCOVY 200-25 MG tablet Take 1 tablet by mouth daily.   11  . Dextromethorphan-guaiFENesin (CORICIDIN HBP CONGESTION/COUGH PO) Take 1 tablet by mouth every 8 (eight) hours as needed (cold symptoms).    . dorzolamide-timolol (COSOPT) 22.3-6.8 MG/ML ophthalmic solution Place 1 drop into the left eye 2 (two) times daily.    Marland Kitchen latanoprost (XALATAN) 0.005 % ophthalmic solution Place 1 drop into the left eye at bedtime.     . midodrine (PROAMATINE) 10 MG tablet Take 1 tablet (10 mg total) by mouth as directed. Twice a day on the day of HD Tuesday, Thursday, Saturday. 30 tablet 0  . montelukast (SINGULAIR) 10 MG tablet Take 1 tablet (10 mg total) by mouth at bedtime. 30 tablet 5  . multivitamin (RENA-VIT) TABS tablet Take 1 tablet by mouth daily.    Marland Kitchen TIVICAY 50 MG tablet Take 50 mg by mouth daily.   11  . Vitamin D, Ergocalciferol, (DRISDOL) 50000 UNITS CAPS capsule Take 50,000 Units by mouth every Friday.   2  . warfarin (COUMADIN)  10 MG tablet Take 10 mg by mouth daily.    . fluticasone (FLONASE) 50 MCG/ACT nasal spray Place 2 sprays into both nostrils daily. 16 g 6   No current facility-administered medications for this visit.     Allergies:   Cozaar [losartan potassium]   Social History   Socioeconomic History  . Marital status: Married    Spouse name: Not on file  . Number of children: 2  . Years of education: 17  . Highest education level: Not on file  Occupational History  . Occupation: retired, picks up Aeronautical engineer: RETIRED  Social Needs  . Financial resource strain: Not on file  . Food insecurity:    Worry: Not on file    Inability: Not on file  . Transportation needs:    Medical: Not on file    Non-medical: Not on file  Tobacco Use  . Smoking status: Former Smoker    Packs/day: 1.00    Years: 30.00    Pack years: 30.00    Types: Cigarettes    Last attempt to quit: 2006    Years since quitting: 13.9  . Smokeless tobacco: Never Used  Substance and Sexual Activity  . Alcohol use: Yes    Comment: once a week-wine  . Drug use: Never  . Sexual activity: Not Currently  Lifestyle  . Physical activity:    Days per week: Not on file    Minutes per session: Not on file  . Stress: Not on file  Relationships  . Social connections:    Talks on phone: Not on file    Gets together: Not on file    Attends religious service: Not on file    Active member of club or organization: Not on file    Attends meetings of clubs or organizations: Not on file    Relationship status: Not on file  Other Topics Concern  . Not on file  Social History Narrative   ** Merged History Encounter **       Stays active at home Regular exercise: no Drinks 2 cups of coffee a week, 1 mountain dew soda a day.     Family History:  The patient's family history includes Cholelithiasis in his daughter and son; Diabetes in his maternal grandmother; Hypertension in his maternal grandmother and mother; Thyroid  disease in his mother.   ROS:   Please see the history of present illness.    All other systems are reviewed and are negative   PHYSICAL EXAM:   VS:  BP 123/65   Pulse 76   Ht 6\' 2"  (1.88 m)   Wt 191 lb 12.8 oz (87 kg)   BMI 24.63 kg/m      General: Alert, oriented x3, no distress Head: no evidence of trauma, PERRL, EOMI, no exophtalmos or lid lag, no myxedema, no xanthelasma; normal ears, nose and oropharynx Neck: normal jugular venous pulsations and no hepatojugular reflux; brisk carotid pulses without delay and no carotid bruits Chest: clear to auscultation, no signs of consolidation by percussion or palpation, normal fremitus, symmetrical and full respiratory excursions Cardiovascular: normal position and quality of the apical impulse, regular rhythm, normal first and second heart sounds, no murmurs, rubs or gallops.  Bruit/thrill over left upper arm AV graft. Abdomen: no tenderness or distention, no masses by palpation, no abnormal pulsatility or arterial bruits, normal bowel sounds, no hepatosplenomegaly Extremities: no clubbing, cyanosis or edema Neurological: grossly nonfocal Psych: Generally appears depressed    Wt Readings from Last 3 Encounters:  11/13/18 190 lb (86.2 kg)  11/11/18 191 lb 12.8 oz (87 kg)  11/01/18 178 lb 5.6 oz (80.9 kg)      Studies/Labs Reviewed:   EKG:  EKG is not ordered today.  November 29 tracing shows sinus tachycardia with bundle branch block left axis deviation.  Recent Labs: 03/03/2018: TSH 1.576 03/09/2018: Magnesium 2.3 03/11/2018: Pro B Natriuretic peptide (BNP) 230.0 11/01/2018: ALT 25; B Natriuretic Peptide 556.2 11/02/2018: Hemoglobin 12.4; Platelets 290 11/13/2018: BUN 49; Creatinine, Ser 8.25; Potassium 4.4; Sodium 138   Lipid Panel    Component Value Date/Time   CHOL 197 12/31/2017 0945   TRIG 152 (H) 03/01/2018 0801   HDL 47 12/31/2017 0945   CHOLHDL 4.2 12/31/2017 0945   CHOLHDL 3.3 05/22/2017 0926   VLDL 12 05/22/2017  0926   LDLCALC 130 (H) 12/31/2017 0945     ASSESSMENT:    No diagnosis found.   PLAN:  In order of problems listed above:  1. CHF: Very difficult balance between hypervolemia and pulmonary edema and hypotension with interruption of dialysis.  We will stop his carvedilol altogether.  Really no plan for heart failure medication.  If hypotension continues to be a problem he needs vasoconstrictors (Midodrine) on dialysis days. 2. AFib: Maintaining normal sinus rhythm on amiodarone.  On warfarin anticoagulation. CHADSVasc4 (age 16, HF, history of hypertension) 3. LBBB: CRT is considered, but he is already having problems with venous access for dialysis and will be at high risk for infection complications.  The risk exceeds the potential benefit. 4. ESRD: Recently placed dialysis access left upper arm, not yet in use 5. HIV: Controlled, followed by Dr. Tommy Medal.  Undetectable viral load in March 6. Amiodarone: Normal liver function tests November 29.  Normal TSH March 2019, should be rechecked with next blood draw on dialysis. 7. Warfarin: INR 3.3 today.  Warfarin dosage adjusted.  Medication Adjustments/Labs and Tests Ordered: Current medicines are reviewed at length with the patient today.  Concerns regarding medicines are outlined above.  Medication changes, Labs and Tests ordered today are listed below. Patient Instructions  Medication Instructions:  Dr Sallyanne Kuster has recommended making the following medication changes: 1. STOP Carvedilol  If you need a refill on your cardiac medications before your next appointment, please call your pharmacy.   Follow-Up: Your physician  recommends that you schedule a follow-up appointment in 3 months with a NP/PA.  Dr Sallyanne Kuster recommends that you schedule a follow-up appointment in 6 months. You will receive a reminder letter in the mail two months in advance. If you don't receive a letter, please call our office to schedule the follow-up appointment.       Signed, [name], MD  11/16/2018 11:25 AM    [facility] [place], [place], [place]  [address] Phone: [phone]; Fax: [phone]

## 2018-11-12 DIAGNOSIS — N2581 Secondary hyperparathyroidism of renal origin: Secondary | ICD-10-CM | POA: Diagnosis not present

## 2018-11-12 DIAGNOSIS — N186 End stage renal disease: Secondary | ICD-10-CM | POA: Diagnosis not present

## 2018-11-13 ENCOUNTER — Ambulatory Visit (INDEPENDENT_AMBULATORY_CARE_PROVIDER_SITE_OTHER): Payer: Medicare HMO | Admitting: Family

## 2018-11-13 ENCOUNTER — Telehealth: Payer: Self-pay | Admitting: *Deleted

## 2018-11-13 ENCOUNTER — Encounter: Payer: Self-pay | Admitting: Family

## 2018-11-13 VITALS — BP 128/68 | HR 78 | Temp 98.6°F | Resp 16 | Ht 74.0 in | Wt 190.0 lb

## 2018-11-13 DIAGNOSIS — N186 End stage renal disease: Secondary | ICD-10-CM | POA: Diagnosis not present

## 2018-11-13 DIAGNOSIS — J309 Allergic rhinitis, unspecified: Secondary | ICD-10-CM

## 2018-11-13 DIAGNOSIS — J810 Acute pulmonary edema: Secondary | ICD-10-CM | POA: Diagnosis not present

## 2018-11-13 DIAGNOSIS — N184 Chronic kidney disease, stage 4 (severe): Secondary | ICD-10-CM

## 2018-11-13 LAB — BASIC METABOLIC PANEL
BUN: 49 mg/dL — ABNORMAL HIGH (ref 6–23)
CO2: 33 meq/L — AB (ref 19–32)
Calcium: 9 mg/dL (ref 8.4–10.5)
Chloride: 93 mEq/L — ABNORMAL LOW (ref 96–112)
Creatinine, Ser: 8.25 mg/dL (ref 0.40–1.50)
GFR: 8.17 mL/min — CL (ref >60.00)
Glucose, Bld: 125 mg/dL — ABNORMAL HIGH (ref 70–99)
Potassium: 4.4 mEq/L (ref 3.5–5.1)
Sodium: 138 mEq/L (ref 135–145)

## 2018-11-13 MED ORDER — FLUTICASONE PROPIONATE 50 MCG/ACT NA SUSP: 2.0000 | Freq: Every day | NASAL | 6 refills | Status: DC

## 2018-11-13 NOTE — Patient Instructions (Signed)
Please complete lab work prior to leaving. Start flonase once daily to help with nasal drainage. Call if symptoms worsen or if symptoms fail to improve.

## 2018-11-13 NOTE — Progress Notes (Signed)
Subjective:    Patient ID: David [name], male    DOB: 02-03-1941, 77 y.o.   MRN: [id number]  HPI  Patient is a 77 yr old male who presents today for hospital follow up. Hospital discharge summary is reviewed. Pt presented to the ED on 11/01/18 with sob found to be in acute hypoxic respiratory failure secondary to pulmonary edema. He was admitted overnight and he underwent a session of HD and felt much better. He reports feeling well since discharge and continues his regular HD schedule.    Denies SOB/CP or swelling.  Reports that he has some watering form his nose and eyes.  Has some post-nasal drip. Reports that he has been taking allergy medication.   Wt Readings from Last 3 Encounters:  11/13/18 190 lb (86.2 kg)  11/11/18 191 lb 12.8 oz (87 kg)  11/01/18 178 lb 5.6 oz (80.9 kg)   Gout- reports that his gout has not flared recently. Continues allopurinol.    Review of Systems See HPI  Past Medical History:  Diagnosis Date  . Acute on chronic systolic and diastolic heart failure, NYHA class 4 ([place])   . Anemia, iron deficiency 11/15/2011  . Arthritis    "hands, right knee, feet" (02/21/2018)  . Atrial fibrillation ([place])   . Cancer [facility])    hx of prostate; s/p radioactive seed implant 10/2009 Dr Janice Norrie  . Cardiac arrest ([place]) 02/17/2018  . CHF (congestive heart failure) ([place])   . Chronic lower back pain   . CKD (chronic kidney disease) stage V requiring chronic dialysis ([place])   . ESRD on dialysis ([place])    started 02/2018  . GERD (gastroesophageal reflux disease)   . Glaucoma   . Gout    daily RX (02/21/2018)  . Heart murmur    "mild" per pt  . Hepatitis    years ago  . History of cardiac cath 2004   negative for CAD  . History of myocardial perfusion scan 02/2010   negative for coronary insufficiency (LVEF 27%)  . HIV (human immunodeficiency virus infection) ([place])   . HIV infection ([place])   . Hyperlipidemia   . Hypertension    followed by [facility] and  Vascular (Dr Dani Gobble Croitoru)  . Hypertension   . Nonischemic cardiomyopathy ([place])   . Pneumonia 11/2017  . Prostate cancer ([place])   . Sinus bradycardia   . Sleep apnea    does not use a cpap  . Stroke [facility])    "mini stroke" years ago  . Systolic and diastolic CHF, acute ([place]) 06/2010   felt to be secondary to hypertensive cardiomyopathy     Social History   Socioeconomic History  . Marital status: Married    Spouse name: Not on file  . Number of children: 2  . Years of education: 36  . Highest education level: Not on file  Occupational History  . Occupation: retired, picks up Aeronautical engineer: RETIRED  Social Needs  . Financial resource strain: Not on file  . Food insecurity:    Worry: Not on file    Inability: Not on file  . Transportation needs:    Medical: Not on file    Non-medical: Not on file  Tobacco Use  . Smoking status: Former Smoker    Packs/day: 1.00    Years: 30.00    Pack years: 30.00    Types: Cigarettes    Last attempt to quit: 2006    Years since quitting: 13.9  .  Smokeless tobacco: Never Used  Substance and Sexual Activity  . Alcohol use: Yes    Comment: once a week-wine  . Drug use: Never  . Sexual activity: Not Currently  Lifestyle  . Physical activity:    Days per week: Not on file    Minutes per session: Not on file  . Stress: Not on file  Relationships  . Social connections:    Talks on phone: Not on file    Gets together: Not on file    Attends religious service: Not on file    Active member of club or organization: Not on file    Attends meetings of clubs or organizations: Not on file    Relationship status: Not on file  . Intimate partner violence:    Fear of current or ex partner: Not on file    Emotionally abused: Not on file    Physically abused: Not on file    Forced sexual activity: Not on file  Other Topics Concern  . Not on file  Social History Narrative   ** Merged History Encounter **       Stays active at  home Regular exercise: no Drinks 2 cups of coffee a week, 1 mountain dew soda a day.    Past Surgical History:  Procedure Laterality Date  . AV FISTULA PLACEMENT Right 10/13/2016   Procedure: ARTERIOVENOUS (AV) FISTULA CREATION;  Surgeon: [name], MD;  Location: [place];  Service: Vascular;  Laterality: Right;  . AV FISTULA PLACEMENT Right 10/13/2016   [name] [phone]  . AV FISTULA PLACEMENT Left 02/25/2018   Procedure: INSERTION OF ARTERIOVENOUS (AV) GORE-TEX GRAFT LEFT UPPER ARM;  Surgeon: Angelia Mould, MD;  Location: [place];  Service: Vascular;  Laterality: Left;  . AV FISTULA PLACEMENT Right 08/07/2018   Procedure: Creation of right arm brachiocephalic Fistula;  Surgeon: Waynetta Sandy, MD;  Location: [place];  Service: Vascular;  Laterality: Right;  . BASCILIC VEIN TRANSPOSITION Left 06/24/2015   Procedure: BASILIC VEIN TRANSPOSITION;  Surgeon: Mal Misty, MD;  Location: [place];  Service: Vascular;  Laterality: Left;  . BASCILIC VEIN TRANSPOSITION Left 6/62/9476 Bascilic vein transposition (Left)   [name] [phone]  . CARDIAC CATHETERIZATION  02/04/2003   mildly depressed LV systolic fx EF 56%,CLEXNT coronaries/abdominal aorta/renal arteries.  Marland Kitchen CARDIAC CATHETERIZATION  2004  . CATARACT EXTRACTION, BILATERAL Bilateral   . COLONOSCOPY    . ESOPHAGOGASTRODUODENOSCOPY (EGD) WITH PROPOFOL N/A 05/05/2014   Procedure: ESOPHAGOGASTRODUODENOSCOPY (EGD) WITH PROPOFOL;  Surgeon: Missy Sabins, MD;  Location: [facility];  Service: Endoscopy;  Laterality: N/A;  . EYE SURGERY Bilateral    cataract surgery   . EYE SURGERY Bilateral    glaucoma surgery  . EYE SURGERY    . GLAUCOMA SURGERY Bilateral   . INSERTION OF ARTERIOVENOUS (AV) ARTEGRAFT ARM  10/09/2018   Procedure: INSERTION OF ARTERIOVENOUS (AV) 5mm x 41cm ARTEGRAFT LEFT UPPER ARM;  Surgeon: Waynetta Sandy, MD;  Location: [place];  Service: Vascular;;  . INSERTION PROSTATE RADIATION SEED    . IR FLUORO GUIDE CV  LINE RIGHT  02/18/2018  . IR US GUIDE VASC ACCESS RIGHT  02/18/2018  . LEFT HEART CATH AND CORONARY ANGIOGRAPHY N/A 02/20/2018   Procedure: LEFT HEART CATH AND CORONARY ANGIOGRAPHY;  Surgeon: Jettie Booze, MD;  Location: [place] [facility];  Service: Cardiovascular;  Laterality: N/A;  . NM MYOCAR PERF WALL MOTION  02/21/2010   normal  . RADIOACTIVE SEED IMPLANT  2010  prostate cancer  . THROMBECTOMY W/ EMBOLECTOMY Left 02/26/2018   Procedure: THROMBECTOMY ARTERIOVENOUS GRAFT;  Surgeon: Angelia Mould, MD;  Location: [place];  Service: Vascular;  Laterality: Left;  . ULTRASOUND GUIDANCE FOR VASCULAR ACCESS  02/20/2018   Procedure: Ultrasound Guidance For Vascular Access;  Surgeon: Jettie Booze, MD;  Location: [place] [facility];  Service: Cardiovascular;;  . UPPER EXTREMITY VENOGRAPHY N/A 07/08/2018   Procedure: UPPER EXTREMITY VENOGRAPHY;  Surgeon: Waynetta Sandy, MD;  Location: [place] [facility];  Service: Cardiovascular;  Laterality: N/A;  Bilateral  . US ECHOCARDIOGRAPHY  02/06/2012   mild LVH,LA mod. dilated,mild-mod. 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 AI.    Family History  Problem Relation Age of Onset  . Hypertension Mother   . Thyroid disease Mother   . Cholelithiasis Daughter   . Cholelithiasis Son   . Hypertension Maternal Grandmother   . Diabetes Maternal Grandmother   . Heart attack Neg Hx   . Hyperlipidemia Neg Hx     Allergies  Allergen Reactions  . Cozaar [Losartan Potassium] Other (See Comments)    Causes constipation     Current Outpatient Medications on File Prior to Visit  Medication Sig Dispense Refill  . acetaminophen (TYLENOL) 500 MG tablet Take 1,000 mg by mouth every 6 (six) hours as needed for mild pain.    Marland Kitchen allopurinol (ZYLOPRIM) 100 MG tablet Take 100 mg by mouth every evening.     Marland Kitchen amiodarone (PACERONE) 200 MG tablet TAKE 1 TABLET(200 MG) BY MOUTH DAILY (Patient taking differently: Take 200  mg by mouth daily. ) 90 tablet 1  . AURYXIA 1 GM 210 MG(Fe) tablet Take 420 mg by mouth 3 (three) times daily with meals.     . brimonidine (ALPHAGAN) 0.15 % ophthalmic solution Place 1 drop into the left eye 2 (two) times daily.  3  . DESCOVY 200-25 MG tablet Take 1 tablet by mouth daily.   11  . Dextromethorphan-guaiFENesin (CORICIDIN HBP CONGESTION/COUGH PO) Take 1 tablet by mouth every 8 (eight) hours as needed (cold symptoms).    . dorzolamide-timolol (COSOPT) 22.3-6.8 MG/ML ophthalmic solution Place 1 drop into the left eye 2 (two) times daily.    Marland Kitchen latanoprost (XALATAN) 0.005 % ophthalmic solution Place 1 drop into the left eye at bedtime.     . midodrine (PROAMATINE) 10 MG tablet Take 1 tablet (10 mg total) by mouth as directed. Twice a day on the day of HD Tuesday, Thursday, Saturday. 30 tablet 0  . montelukast (SINGULAIR) 10 MG tablet Take 1 tablet (10 mg total) by mouth at bedtime. 30 tablet 5  . multivitamin (RENA-VIT) TABS tablet Take 1 tablet by mouth daily.    Marland Kitchen TIVICAY 50 MG tablet Take 50 mg by mouth daily.   11  . Vitamin D, Ergocalciferol, (DRISDOL) 50000 UNITS CAPS capsule Take 50,000 Units by mouth every Friday.   2  . warfarin (COUMADIN) 10 MG tablet Take 10 mg by mouth daily.     No current facility-administered medications on file prior to visit.     BP 128/68 (BP Location: Left Arm, Patient Position: Sitting, Cuff Size: Small)   Pulse 78   Temp 98.6 F (37 C) (Oral)   Resp 16   Ht 6\' 2"  (1.88 m)   Wt 190 lb (86.2 kg)   SpO2 100%   BMI 24.39 kg/m       Objective:   Physical Exam  Constitutional: He is oriented to  person, place, and time. He appears well-developed and well-nourished. No distress.  HENT:  Head: Normocephalic and atraumatic.  Cardiovascular: Normal rate and regular rhythm.  No murmur heard. Pulmonary/Chest: Effort normal and breath sounds normal. No respiratory distress. He has no wheezes. He has no rales.  Musculoskeletal: He exhibits no  edema.  Neurological: He is alert and oriented to person, place, and time.  Skin: Skin is warm and dry.  Psychiatric: He has a normal mood and affect. His behavior is normal. Thought content normal.          Assessment & Plan:  Pulmonary Edema- in setting of ESRD- resolved following HD. Weight stable. Monitor.  ESRD- obtain follow up bmet.   Allergic rhinitis- trial of flonase.  Gout- stable on allopurinol. Continue same.

## 2018-11-13 NOTE — Telephone Encounter (Signed)
Patient is on hemodialysis, potassium okay, forwarding to PCP.

## 2018-11-13 NOTE — Telephone Encounter (Signed)
CRITICAL VALUE STICKER  CRITICAL VALUE: Creatinine 8.25.  GFR 8.17  RECEIVER (on-site recipient of call): Kelle Darting, [place] NOTIFIED: 11/13/18 4:47pm   MESSENGER (representative from lab): [facility] @ [facility]  MD NOTIFIED:  Dr Larose Kells / Damita Dunnings, CMA  TIME OF NOTIFICATION: 11/13/18 @ 4: 50pm  RESPONSE:

## 2018-11-14 DIAGNOSIS — N2581 Secondary hyperparathyroidism of renal origin: Secondary | ICD-10-CM | POA: Diagnosis not present

## 2018-11-14 DIAGNOSIS — N186 End stage renal disease: Secondary | ICD-10-CM | POA: Diagnosis not present

## 2018-11-15 NOTE — Telephone Encounter (Signed)
Noted  

## 2018-11-16 DIAGNOSIS — N2581 Secondary hyperparathyroidism of renal origin: Secondary | ICD-10-CM | POA: Diagnosis not present

## 2018-11-16 DIAGNOSIS — Z79899 Other long term (current) drug therapy: Secondary | ICD-10-CM

## 2018-11-16 DIAGNOSIS — I480 Paroxysmal atrial fibrillation: Secondary | ICD-10-CM | POA: Insufficient documentation

## 2018-11-16 DIAGNOSIS — I447 Left bundle-branch block, unspecified: Secondary | ICD-10-CM | POA: Insufficient documentation

## 2018-11-16 DIAGNOSIS — Z5181 Encounter for therapeutic drug level monitoring: Secondary | ICD-10-CM | POA: Insufficient documentation

## 2018-11-16 DIAGNOSIS — N186 End stage renal disease: Secondary | ICD-10-CM | POA: Diagnosis not present

## 2018-11-18 ENCOUNTER — Ambulatory Visit: Payer: Medicare HMO | Admitting: Family

## 2018-11-19 DIAGNOSIS — N2581 Secondary hyperparathyroidism of renal origin: Secondary | ICD-10-CM | POA: Diagnosis not present

## 2018-11-19 DIAGNOSIS — N186 End stage renal disease: Secondary | ICD-10-CM | POA: Diagnosis not present

## 2018-11-20 ENCOUNTER — Ambulatory Visit (INDEPENDENT_AMBULATORY_CARE_PROVIDER_SITE_OTHER): Payer: Medicare HMO | Admitting: Pharmacist

## 2018-11-20 DIAGNOSIS — I482 Chronic atrial fibrillation, unspecified: Secondary | ICD-10-CM

## 2018-11-20 DIAGNOSIS — I4891 Unspecified atrial fibrillation: Secondary | ICD-10-CM | POA: Diagnosis not present

## 2018-11-20 DIAGNOSIS — Z5181 Encounter for therapeutic drug level monitoring: Secondary | ICD-10-CM

## 2018-11-20 LAB — POCT INR: INR: 2.2 (ref 2.0–3.0)

## 2018-11-20 NOTE — Patient Instructions (Signed)
Continue taking 5mg  daily. Repeat INR in 2 weeks. Orders faxed to [facility] at [phone]

## 2018-11-21 DIAGNOSIS — N2581 Secondary hyperparathyroidism of renal origin: Secondary | ICD-10-CM | POA: Diagnosis not present

## 2018-11-21 DIAGNOSIS — N186 End stage renal disease: Secondary | ICD-10-CM | POA: Diagnosis not present

## 2018-11-22 DIAGNOSIS — R69 Illness, unspecified: Secondary | ICD-10-CM | POA: Diagnosis not present

## 2018-11-23 DIAGNOSIS — N2581 Secondary hyperparathyroidism of renal origin: Secondary | ICD-10-CM | POA: Diagnosis not present

## 2018-11-23 DIAGNOSIS — N186 End stage renal disease: Secondary | ICD-10-CM | POA: Diagnosis not present

## 2018-11-25 DIAGNOSIS — N2581 Secondary hyperparathyroidism of renal origin: Secondary | ICD-10-CM | POA: Diagnosis not present

## 2018-11-25 DIAGNOSIS — N186 End stage renal disease: Secondary | ICD-10-CM | POA: Diagnosis not present

## 2018-11-28 DIAGNOSIS — N186 End stage renal disease: Secondary | ICD-10-CM | POA: Diagnosis not present

## 2018-11-28 DIAGNOSIS — N2581 Secondary hyperparathyroidism of renal origin: Secondary | ICD-10-CM | POA: Diagnosis not present

## 2018-11-30 DIAGNOSIS — N186 End stage renal disease: Secondary | ICD-10-CM | POA: Diagnosis not present

## 2018-11-30 DIAGNOSIS — N2581 Secondary hyperparathyroidism of renal origin: Secondary | ICD-10-CM | POA: Diagnosis not present

## 2018-12-02 DIAGNOSIS — N2581 Secondary hyperparathyroidism of renal origin: Secondary | ICD-10-CM | POA: Diagnosis not present

## 2018-12-02 DIAGNOSIS — N186 End stage renal disease: Secondary | ICD-10-CM | POA: Diagnosis not present

## 2018-12-03 DIAGNOSIS — N186 End stage renal disease: Secondary | ICD-10-CM | POA: Diagnosis not present

## 2018-12-03 DIAGNOSIS — Z992 Dependence on renal dialysis: Secondary | ICD-10-CM | POA: Diagnosis not present

## 2018-12-03 DIAGNOSIS — B20 Human immunodeficiency virus [HIV] disease: Secondary | ICD-10-CM | POA: Diagnosis not present

## 2018-12-04 ENCOUNTER — Emergency Department (HOSPITAL_COMMUNITY): Payer: Medicare HMO

## 2018-12-04 ENCOUNTER — Encounter (HOSPITAL_COMMUNITY): Payer: Self-pay

## 2018-12-04 ENCOUNTER — Inpatient Hospital Stay (HOSPITAL_COMMUNITY)
Admission: EM | Admit: 2018-12-04 | Discharge: 2018-12-06 | DRG: 291 | Disposition: A | Discharge status: Home / Self Care | Payer: Medicare HMO | Attending: Internal Medicine | Admitting: Internal Medicine

## 2018-12-04 ENCOUNTER — Other Ambulatory Visit: Payer: Self-pay

## 2018-12-04 DIAGNOSIS — Z992 Dependence on renal dialysis: Secondary | ICD-10-CM

## 2018-12-04 DIAGNOSIS — R0602 Shortness of breath: Secondary | ICD-10-CM | POA: Diagnosis not present

## 2018-12-04 DIAGNOSIS — R402253 Coma scale, best verbal response, oriented, at hospital admission: Secondary | ICD-10-CM | POA: Diagnosis present

## 2018-12-04 DIAGNOSIS — G8929 Other chronic pain: Secondary | ICD-10-CM | POA: Diagnosis present

## 2018-12-04 DIAGNOSIS — M109 Gout, unspecified: Secondary | ICD-10-CM | POA: Diagnosis present

## 2018-12-04 DIAGNOSIS — Z8674 Personal history of sudden cardiac arrest: Secondary | ICD-10-CM

## 2018-12-04 DIAGNOSIS — M549 Dorsalgia, unspecified: Secondary | ICD-10-CM | POA: Diagnosis present

## 2018-12-04 DIAGNOSIS — G4730 Sleep apnea, unspecified: Secondary | ICD-10-CM | POA: Diagnosis present

## 2018-12-04 DIAGNOSIS — I132 Hypertensive heart and chronic kidney disease with heart failure and with stage 5 chronic kidney disease, or end stage renal disease: Principal | ICD-10-CM | POA: Diagnosis present

## 2018-12-04 DIAGNOSIS — I43 Cardiomyopathy in diseases classified elsewhere: Secondary | ICD-10-CM | POA: Diagnosis present

## 2018-12-04 DIAGNOSIS — N186 End stage renal disease: Secondary | ICD-10-CM | POA: Diagnosis present

## 2018-12-04 DIAGNOSIS — I5023 Acute on chronic systolic (congestive) heart failure: Secondary | ICD-10-CM | POA: Diagnosis present

## 2018-12-04 DIAGNOSIS — I4891 Unspecified atrial fibrillation: Secondary | ICD-10-CM | POA: Diagnosis present

## 2018-12-04 DIAGNOSIS — Z8673 Personal history of transient ischemic attack (TIA), and cerebral infarction without residual deficits: Secondary | ICD-10-CM | POA: Diagnosis not present

## 2018-12-04 DIAGNOSIS — R402363 Coma scale, best motor response, obeys commands, at hospital admission: Secondary | ICD-10-CM | POA: Diagnosis present

## 2018-12-04 DIAGNOSIS — Z79899 Other long term (current) drug therapy: Secondary | ICD-10-CM

## 2018-12-04 DIAGNOSIS — I447 Left bundle-branch block, unspecified: Secondary | ICD-10-CM | POA: Diagnosis present

## 2018-12-04 DIAGNOSIS — J9601 Acute respiratory failure with hypoxia: Secondary | ICD-10-CM | POA: Diagnosis present

## 2018-12-04 DIAGNOSIS — J810 Acute pulmonary edema: Secondary | ICD-10-CM | POA: Diagnosis not present

## 2018-12-04 DIAGNOSIS — R000 Tachycardia, unspecified: Secondary | ICD-10-CM | POA: Diagnosis not present

## 2018-12-04 DIAGNOSIS — Z888 Allergy status to other drugs, medicaments and biological substances status: Secondary | ICD-10-CM

## 2018-12-04 DIAGNOSIS — Z7901 Long term (current) use of anticoagulants: Secondary | ICD-10-CM

## 2018-12-04 DIAGNOSIS — Z21 Asymptomatic human immunodeficiency virus [HIV] infection status: Secondary | ICD-10-CM | POA: Diagnosis present

## 2018-12-04 DIAGNOSIS — I482 Chronic atrial fibrillation, unspecified: Secondary | ICD-10-CM | POA: Diagnosis present

## 2018-12-04 DIAGNOSIS — I10 Essential (primary) hypertension: Secondary | ICD-10-CM | POA: Diagnosis not present

## 2018-12-04 DIAGNOSIS — Z86718 Personal history of other venous thrombosis and embolism: Secondary | ICD-10-CM

## 2018-12-04 DIAGNOSIS — D631 Anemia in chronic kidney disease: Secondary | ICD-10-CM | POA: Diagnosis present

## 2018-12-04 DIAGNOSIS — H409 Unspecified glaucoma: Secondary | ICD-10-CM | POA: Diagnosis present

## 2018-12-04 DIAGNOSIS — Z87891 Personal history of nicotine dependence: Secondary | ICD-10-CM

## 2018-12-04 DIAGNOSIS — I420 Dilated cardiomyopathy: Secondary | ICD-10-CM | POA: Diagnosis present

## 2018-12-04 DIAGNOSIS — Z8546 Personal history of malignant neoplasm of prostate: Secondary | ICD-10-CM | POA: Diagnosis not present

## 2018-12-04 DIAGNOSIS — Z4901 Encounter for fitting and adjustment of extracorporeal dialysis catheter: Secondary | ICD-10-CM | POA: Diagnosis not present

## 2018-12-04 DIAGNOSIS — R402143 Coma scale, eyes open, spontaneous, at hospital admission: Secondary | ICD-10-CM | POA: Diagnosis present

## 2018-12-04 DIAGNOSIS — Z923 Personal history of irradiation: Secondary | ICD-10-CM

## 2018-12-04 DIAGNOSIS — J811 Chronic pulmonary edema: Secondary | ICD-10-CM | POA: Diagnosis not present

## 2018-12-04 DIAGNOSIS — B20 Human immunodeficiency virus [HIV] disease: Secondary | ICD-10-CM | POA: Diagnosis present

## 2018-12-04 DIAGNOSIS — I491 Atrial premature depolarization: Secondary | ICD-10-CM | POA: Diagnosis not present

## 2018-12-04 LAB — CBC WITH DIFFERENTIAL/PLATELET
Abs Immature Granulocytes: 0.04 10*3/uL (ref 0.00–0.07)
Basophils Absolute: 0.1 10*3/uL (ref 0.0–0.1)
Basophils Relative: 1 %
EOS ABS: 0.4 10*3/uL (ref 0.0–0.5)
EOS PCT: 3 %
HCT: 41.4 % (ref 39.0–52.0)
Hemoglobin: 12.9 g/dL — ABNORMAL LOW (ref 13.0–17.0)
Immature Granulocytes: 0 %
Lymphocytes Relative: 46 %
Lymphs Abs: 5.5 10*3/uL — ABNORMAL HIGH (ref 0.7–4.0)
MCH: 33.7 pg (ref 26.0–34.0)
MCHC: 31.2 g/dL (ref 30.0–36.0)
MCV: 108.1 fL — ABNORMAL HIGH (ref 80.0–100.0)
MONO ABS: 0.6 10*3/uL (ref 0.1–1.0)
Monocytes Relative: 5 %
Neutro Abs: 5.4 10*3/uL (ref 1.7–7.7)
Neutrophils Relative %: 45 %
Platelets: 255 10*3/uL (ref 150–400)
RBC: 3.83 MIL/uL — ABNORMAL LOW (ref 4.22–5.81)
RDW: 14.3 % (ref 11.5–15.5)
WBC: 11.9 10*3/uL — ABNORMAL HIGH (ref 4.0–10.5)
nRBC: 0 % (ref 0.0–0.2)

## 2018-12-04 LAB — I-STAT CHEM 8, ED
BUN: 57 mg/dL — ABNORMAL HIGH (ref 8–23)
CALCIUM ION: 1.04 mmol/L — AB (ref 1.15–1.40)
Chloride: 100 mmol/L (ref 98–111)
Creatinine, Ser: 10.2 mg/dL — ABNORMAL HIGH (ref 0.61–1.24)
Glucose, Bld: 168 mg/dL — ABNORMAL HIGH (ref 70–99)
HCT: 44 % (ref 39.0–52.0)
Hemoglobin: 15 g/dL (ref 13.0–17.0)
Potassium: 4.2 mmol/L (ref 3.5–5.1)
Sodium: 140 mmol/L (ref 135–145)
TCO2: 32 mmol/L (ref 22–32)

## 2018-12-04 LAB — I-STAT VENOUS BLOOD GAS, ED
Acid-Base Excess: 6 mmol/L — ABNORMAL HIGH (ref 0.0–2.0)
Bicarbonate: 34.3 mmol/L — ABNORMAL HIGH (ref 20.0–28.0)
O2 SAT: 52 %
TCO2: 36 mmol/L — AB (ref 22–32)
pCO2, Ven: 63.7 mmHg — ABNORMAL HIGH (ref 44.0–60.0)
pH, Ven: 7.339 (ref 7.250–7.430)
pO2, Ven: 30 mmHg — CL (ref 32.0–45.0)

## 2018-12-04 LAB — COMPREHENSIVE METABOLIC PANEL
ALT: 34 U/L (ref 0–44)
AST: 39 U/L (ref 15–41)
Albumin: 4.2 g/dL (ref 3.5–5.0)
Alkaline Phosphatase: 121 U/L (ref 38–126)
Anion gap: 19 — ABNORMAL HIGH (ref 5–15)
BUN: 72 mg/dL — ABNORMAL HIGH (ref 8–23)
CO2: 28 mmol/L (ref 22–32)
Calcium: 9.5 mg/dL (ref 8.9–10.3)
Chloride: 97 mmol/L — ABNORMAL LOW (ref 98–111)
Creatinine, Ser: 9.9 mg/dL — ABNORMAL HIGH (ref 0.61–1.24)
GFR calc non Af Amer: 5 mL/min — ABNORMAL LOW (ref >60)
GFR, EST AFRICAN AMERICAN: 5 mL/min — AB (ref >60)
Glucose, Bld: 174 mg/dL — ABNORMAL HIGH (ref 70–99)
Potassium: 4.2 mmol/L (ref 3.5–5.1)
Sodium: 144 mmol/L (ref 135–145)
Total Bilirubin: 0.4 mg/dL (ref 0.3–1.2)
Total Protein: 8.6 g/dL — ABNORMAL HIGH (ref 6.5–8.1)

## 2018-12-04 LAB — BRAIN NATRIURETIC PEPTIDE: B Natriuretic Peptide: 1630.5 pg/mL — ABNORMAL HIGH (ref 0.0–100.0)

## 2018-12-04 LAB — PROTIME-INR
INR: 1.88
Prothrombin Time: 21.4 seconds — ABNORMAL HIGH (ref 11.4–15.2)

## 2018-12-04 LAB — I-STAT TROPONIN, ED: Troponin i, poc: 0.06 ng/mL (ref 0.00–0.08)

## 2018-12-04 MED ORDER — CHLORHEXIDINE GLUCONATE CLOTH 2 % EX PADS
6.0000 | MEDICATED_PAD | Freq: Every day | CUTANEOUS | Status: DC
  Administered 2018-12-05: 6 via TOPICAL

## 2018-12-04 MED ORDER — DILTIAZEM HCL-DEXTROSE 100-5 MG/100ML-% IV SOLN (PREMIX): 5.0000 mg/h | INTRAVENOUS | Status: DC

## 2018-12-04 MED ORDER — DILTIAZEM HCL 25 MG/5ML IV SOLN: 20.0000 mg | Freq: Once | INTRAVENOUS | Status: DC

## 2018-12-04 MED ORDER — AMIODARONE HCL IN DEXTROSE 360-4.14 MG/200ML-% IV SOLN
60.0000 mg/h | INTRAVENOUS | Status: AC
  Administered 2018-12-04: 60 mg/h via INTRAVENOUS
  Filled 2018-12-04: qty 200

## 2018-12-04 MED ORDER — NITROGLYCERIN IN D5W 200-5 MCG/ML-% IV SOLN: 0.0000 ug/min | INTRAVENOUS | Status: DC

## 2018-12-04 MED ORDER — AMIODARONE IV BOLUS ONLY 150 MG/100ML
150.0000 mg | Freq: Once | INTRAVENOUS | Status: AC
  Administered 2018-12-04: 150 mg via INTRAVENOUS

## 2018-12-04 NOTE — ED Triage Notes (Signed)
Pt brought in by [facility] from home for SOB that started tonight. Pt last received dialysis Monday. Per EMS pt has bilateral rales, but able to speak in full sentences. Pt breathing is labored and tachypnic on arrival, EDP at bedside, pt placed on bipap.

## 2018-12-04 NOTE — ED Provider Notes (Signed)
[facility] EMERGENCY DEPARTMENT Provider Note   CSN: [id number] Arrival date & time: 12/04/18  2202     History   Chief Complaint Chief Complaint  Patient presents with  . Shortness of Breath    HPI [place] is a 78 y.o. male history of A. fib on Coumadin, ESRD on dialysis, EF of 15% here presenting with shortness of breath.  Patient's last dialysis was 2 days ago.  Patient was supposed to get dialysis today but unclear why he did not get dialysis.  He states that about 30 minutes prior to arrival, he has sudden onset of shortness of breath.  Patient denies any leg swelling.  EMS put patient on nonrebreather and patient started getting more tachypneic so they were requesting CPAP.  The history is provided by the patient.    Past Medical History:  Diagnosis Date  . Acute on chronic systolic and diastolic heart failure, NYHA class 4 ([place])   . Anemia, iron deficiency 11/15/2011  . Arthritis    "hands, right knee, feet" (02/21/2018)  . Atrial fibrillation ([place])   . Cancer [facility])    hx of prostate; s/p radioactive seed implant 10/2009 Dr Janice Norrie  . Cardiac arrest ([place]) 02/17/2018  . CHF (congestive heart failure) ([place])   . Chronic lower back pain   . CKD (chronic kidney disease) stage V requiring chronic dialysis ([place])   . ESRD on dialysis ([place])    started 02/2018  . GERD (gastroesophageal reflux disease)   . Glaucoma   . Gout    daily RX (02/21/2018)  . Heart murmur    "mild" per pt  . Hepatitis    years ago  . History of cardiac cath 2004   negative for CAD  . History of myocardial perfusion scan 02/2010   negative for coronary insufficiency (LVEF 27%)  . HIV (human immunodeficiency virus infection) ([place])   . HIV infection ([place])   . Hyperlipidemia   . Hypertension    followed by [facility] (Dr Dani Gobble Croitoru)  . Hypertension   . Nonischemic cardiomyopathy ([place])   . Pneumonia 11/2017  . Prostate cancer ([place])   . Sinus  bradycardia   . Sleep apnea    does not use a cpap  . Stroke [facility])    "mini stroke" years ago  . Systolic and diastolic CHF, acute ([place]) 06/2010   felt to be secondary to hypertensive cardiomyopathy    Patient Active Problem List   Diagnosis Date Noted  . Paroxysmal atrial fibrillation ([place]) 11/16/2018  . LBBB (left bundle branch block) 11/16/2018  . Encounter for monitoring amiodarone therapy 11/16/2018  . Volume overload 11/01/2018  . Pulmonary edema 10/21/2018  . Gout 10/21/2018  . Hypertensive emergency 10/21/2018  . Chronic combined systolic and diastolic heart failure ([place]) 10/21/2018  . Leukocytosis 10/21/2018  . Gastroesophageal reflux disease 08/28/2018  . Chronic anticoagulation 04/03/2018  . CAD (coronary artery disease) 04/03/2018  . Encounter for therapeutic drug monitoring 03/27/2018  . Atrial fibrillation, chronic 03/25/2018  . Palliative care by specialist   . Advance care planning   . Goals of care, counseling/discussion   . Cardiac arrest ([place]) 02/17/2018  . Respiratory arrest ([place])   . Acute on chronic systolic heart failure ([place])   . Chronic obstructive pulmonary disease ([place])   . HIV (human immunodeficiency virus infection) ([place])   . ESRD (end stage renal disease) ([place])   . Acute respiratory failure with hypoxia ([place]) 01/21/2018  . CAP (community  acquired pneumonia)   . Aspiration pneumonia of both lower lobes due to regurgitated food ([place])   . Sepsis due to pneumonia ([place]) 11/22/2017  . AIDS ([place]) 05/14/2014  . 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 Gastroparesis 05/06/2014  . Protein-calorie malnutrition, severe ([place]) 05/05/2014  . ESRD on hemodialysis ([place]) 05/02/2014  . Hypotension 05/02/2014  . Loss of weight 04/29/2014  . Conjunctivitis 04/29/2014  . 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 Low back pain 04/09/2012  . Osteoarthritis of hip 12/29/2011  . Iron deficiency anemia 06/28/2011  . Macrocytic anemia 04/02/2011  . 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 Hyperlipidemia 11/30/2010  . Transient cerebral ischemia 11/30/2010  . HYPERGLYCEMIA 11/30/2010    Past Surgical History:  Procedure Laterality Date  . AV FISTULA PLACEMENT Right 10/13/2016   Procedure: ARTERIOVENOUS (AV) FISTULA CREATION;  Surgeon: [name], MD;  Location: [place];  Service: Vascular;  Laterality: Right;  . AV FISTULA PLACEMENT Right 10/13/2016   [name] [phone]  . AV FISTULA PLACEMENT Left 02/25/2018   Procedure: INSERTION OF ARTERIOVENOUS (AV) GORE-TEX GRAFT LEFT UPPER ARM;  Surgeon: Angelia Mould, MD;  Location: [place];  Service: Vascular;  Laterality: Left;  . AV FISTULA PLACEMENT Right 08/07/2018   Procedure: Creation of right arm brachiocephalic Fistula;  Surgeon: Waynetta Sandy, MD;  Location: [place];  Service: Vascular;  Laterality: Right;  . BASCILIC VEIN TRANSPOSITION Left 06/24/2015   Procedure: BASILIC VEIN TRANSPOSITION;  Surgeon: Mal Misty, MD;  Location: [place];  Service: Vascular;  Laterality: Left;  . BASCILIC VEIN TRANSPOSITION Left 2/59/5638 Bascilic vein transposition (Left)   [name] [phone]  . CARDIAC CATHETERIZATION  02/04/2003   mildly depressed LV systolic fx EF 18%,ACZYSA coronaries/abdominal aorta/renal arteries.  Marland Kitchen CARDIAC CATHETERIZATION  2004  . CATARACT EXTRACTION, BILATERAL Bilateral   . COLONOSCOPY    . ESOPHAGOGASTRODUODENOSCOPY (EGD) WITH PROPOFOL N/A 05/05/2014   Procedure: ESOPHAGOGASTRODUODENOSCOPY (EGD) WITH PROPOFOL;  Surgeon: Missy Sabins, MD;  Location: [facility];  Service: Endoscopy;  Laterality: N/A;  . EYE SURGERY Bilateral    cataract surgery   . EYE SURGERY Bilateral    glaucoma surgery  . EYE SURGERY    . GLAUCOMA SURGERY Bilateral   . INSERTION OF ARTERIOVENOUS (AV) ARTEGRAFT ARM  10/09/2018   Procedure: INSERTION OF ARTERIOVENOUS (AV) 5mm x 41cm ARTEGRAFT LEFT UPPER ARM;  Surgeon: Waynetta Sandy, MD;  Location: [place];  Service: Vascular;;  .  INSERTION PROSTATE RADIATION SEED    . IR FLUORO GUIDE CV LINE RIGHT  02/18/2018  . IR US GUIDE VASC ACCESS RIGHT  02/18/2018  . LEFT HEART CATH AND CORONARY ANGIOGRAPHY N/A 02/20/2018   Procedure: LEFT HEART CATH AND CORONARY ANGIOGRAPHY;  Surgeon: Jettie Booze, MD;  Location: [place] CV LAB;  Service: Cardiovascular;  Laterality: N/A;  . NM MYOCAR PERF WALL MOTION  02/21/2010   normal  . RADIOACTIVE SEED IMPLANT  2010   prostate cancer  . THROMBECTOMY W/ EMBOLECTOMY Left 02/26/2018   Procedure: THROMBECTOMY ARTERIOVENOUS GRAFT;  Surgeon: Angelia Mould, MD;  Location: [place];  Service: Vascular;  Laterality: Left;  . ULTRASOUND GUIDANCE FOR VASCULAR ACCESS  02/20/2018   Procedure: Ultrasound Guidance For Vascular Access;  Surgeon: Jettie Booze, MD;  Location: [place] CV LAB;  Service: Cardiovascular;;  . UPPER EXTREMITY VENOGRAPHY N/A 07/08/2018   Procedure: UPPER EXTREMITY VENOGRAPHY;  Surgeon: Waynetta Sandy, MD;  Location: [place] CV LAB;  Service: Cardiovascular;  Laterality: N/A;  Bilateral  . US ECHOCARDIOGRAPHY  02/06/2012   mild LVH,LA mod. dilated,mild-mod. 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 AI.        Home Medications    Prior to Admission medications   Medication Sig Start Date End Date Taking? Authorizing Provider  acetaminophen (TYLENOL) 500 MG tablet Take 1,000 mg by mouth every 6 (six) hours as needed for mild pain.    [provider]  allopurinol (ZYLOPRIM) 100 MG tablet Take 100 mg by mouth every evening.  06/27/18   [provider]  amiodarone (PACERONE) 200 MG tablet TAKE 1 TABLET(200 MG) BY MOUTH DAILY Patient taking differently: Take 200 mg by mouth daily.  10/29/18   Croitoru, Mihai, MD  AURYXIA 1 GM 210 MG(Fe) tablet Take 420 mg by mouth 3 (three) times daily with meals.  08/05/18   [provider]  brimonidine (ALPHAGAN) 0.15 % ophthalmic solution Place 1 drop into the  left eye 2 (two) times daily. 11/15/17   [provider]  DESCOVY 200-25 MG tablet Take 1 tablet by mouth daily.  01/20/18   [provider]  Dextromethorphan-guaiFENesin (CORICIDIN HBP CONGESTION/COUGH PO) Take 1 tablet by mouth every 8 (eight) hours as needed (cold symptoms).    [provider]  dorzolamide-timolol (COSOPT) 22.3-6.8 MG/ML ophthalmic solution Place 1 drop into the left eye 2 (two) times daily. 12/17/17   [provider]  fluticasone (FLONASE) 50 MCG/ACT nasal spray Place 2 sprays into both nostrils daily. 11/13/18   [name], NP  latanoprost (XALATAN) 0.005 % ophthalmic solution Place 1 drop into the left eye at bedtime.     [provider]  midodrine (PROAMATINE) 10 MG tablet Take 1 tablet (10 mg total) by mouth as directed. Twice a day on the day of HD Tuesday, Thursday, Saturday. 10/23/18   Lavina Hamman, MD  montelukast (SINGULAIR) 10 MG tablet Take 1 tablet (10 mg total) by mouth at bedtime. 08/12/18   [name], NP  multivitamin (RENA-VIT) TABS tablet Take 1 tablet by mouth daily.    [provider]  TIVICAY 50 MG tablet Take 50 mg by mouth daily.  01/20/18   [provider]  Vitamin D, Ergocalciferol, (DRISDOL) 50000 UNITS CAPS capsule Take 50,000 Units by mouth every Friday.  04/10/15   [provider]  warfarin (COUMADIN) 10 MG tablet Take 10 mg by mouth daily.    [provider]    Family History Family History  Problem Relation Age of Onset  . Hypertension Mother   . Thyroid disease Mother   . Cholelithiasis Daughter   . Cholelithiasis Son   . Hypertension Maternal Grandmother   . Diabetes Maternal Grandmother   . Heart attack Neg Hx   . Hyperlipidemia Neg Hx     Social History Social History   Tobacco Use  . Smoking status: Former Smoker    Packs/day: 1.00    Years: 30.00    Pack years: 30.00    Types: Cigarettes    Last attempt to quit: 2006    Years  since quitting: 14.0  . Smokeless tobacco: Never Used  Substance Use Topics  . Alcohol use: Yes    Comment: once a week-wine  . Drug use: Never     Allergies   Cozaar [losartan potassium]   Review of Systems Review of Systems  Respiratory: Positive for shortness of breath.   All other systems reviewed and are negative.    Physical Exam Updated Vital Signs  BP (!) 149/94   Pulse (!) 115   Resp (!) 30   Ht 6\' 2"  (1.88 m)   Wt 86.2 kg   SpO2 96%   BMI 24.40 kg/m   Physical Exam Vitals signs and nursing note reviewed.  HENT:     Head: Normocephalic.     Mouth/Throat:     Mouth: Mucous membranes are moist.  Eyes:     Pupils: Pupils are equal, round, and reactive to light.  Neck:     Musculoskeletal: Normal range of motion.  Cardiovascular:     Comments: Tachycardic, irregular  Pulmonary:     Comments: Tachypneic, crackles bilateral bases  Abdominal:     General: Bowel sounds are normal.     Palpations: Abdomen is soft.  Musculoskeletal: Normal range of motion.     Right lower leg: Edema present.     Left lower leg: Edema present.  Skin:    General: Skin is warm.  Neurological:     General: No focal deficit present.     Mental Status: He is alert.      ED Treatments / Results  Labs (all labs ordered are listed, but only abnormal results are displayed) Labs Reviewed  CBC WITH DIFFERENTIAL/PLATELET - Abnormal; Notable for the following components:      Result Value   WBC 11.9 (*)    RBC 3.83 (*)    Hemoglobin 12.9 (*)    MCV 108.1 (*)    Lymphs Abs 5.5 (*)    All other components within normal limits  COMPREHENSIVE METABOLIC PANEL - Abnormal; Notable for the following components:   Chloride 97 (*)    Glucose, Bld 174 (*)    BUN 72 (*)    Creatinine, Ser 9.90 (*)    Total Protein 8.6 (*)    GFR calc non Af Amer 5 (*)    GFR calc Af Amer 5 (*)    Anion gap 19 (*)    All other components within normal limits  PROTIME-INR - Abnormal; Notable for  the following components:   Prothrombin Time 21.4 (*)    All other components within normal limits  I-STAT CHEM 8, ED - Abnormal; Notable for the following components:   BUN 57 (*)    Creatinine, Ser 10.20 (*)    Glucose, Bld 168 (*)    Calcium, Ion 1.04 (*)    All other components within normal limits  I-STAT VENOUS BLOOD GAS, ED - Abnormal; Notable for the following components:   pCO2, Ven 63.7 (*)    pO2, Ven 30.0 (*)    Bicarbonate 34.3 (*)    TCO2 36 (*)    Acid-Base Excess 6.0 (*)    All other components within normal limits  BRAIN NATRIURETIC PEPTIDE  I-STAT TROPONIN, ED    EKG EKG Interpretation  Date/Time:  Wednesday December 04 2018 22:10:58 EST Ventricular Rate:  141 PR Interval:    QRS Duration: 142 QT Interval:  351 QTC Calculation: 538 R Axis:   -117 Text Interpretation:  Sinus tachycardia Nonspecific IVCD with LAD Inferior infarct, acute LBBB old from previous  rate faster than previous  Confirmed by Wandra Arthurs ([id number]) on 12/04/2018 10:30:25 PM   Radiology Dg Chest Port 1 View  Result Date: 12/04/2018 CLINICAL DATA:  Shortness of breath EXAM: PORTABLE CHEST 1 VIEW COMPARISON:  11/01/2018, 10/21/2018 FINDINGS: Right-sided central venous catheter tip over the SVC. Cardiomegaly with vascular congestion and hazy and ground-glass opacities suspicious for pulmonary edema. More confluent opacity  at the bases. No pneumothorax. IMPRESSION: 1. Cardiomegaly with vascular congestion and moderate pulmonary edema 2. Superimposed more confluent atelectasis or pneumonia at both bases. Electronically Signed   By: Donavan Foil M.D.   On: 12/04/2018 22:28    Procedures Procedures (including critical care time)  CRITICAL CARE Performed by: Wandra Arthurs   Total critical care time:30  minutes  Critical care time was exclusive of separately billable procedures and treating other patients.  Critical care was necessary to treat or prevent imminent or life-threatening  deterioration.  Critical care was time spent personally by me on the following activities: development of treatment plan with patient and/or surrogate as well as nursing, discussions with consultants, evaluation of patient's response to treatment, examination of patient, obtaining history from patient or surrogate, ordering and performing treatments and interventions, ordering and review of laboratory studies, ordering and review of radiographic studies, pulse oximetry and re-evaluation of patient's condition.   Medications Ordered in ED Medications  diltiazem (CARDIZEM) injection 20 mg (20 mg Intravenous Not Given 12/04/18 2234)  amiodarone (NEXTERONE PREMIX) 360-4.14 MG/200ML-% (1.8 mg/mL) IV infusion (60 mg/hr Intravenous New Bag/Given 12/04/18 2227)  Chlorhexidine Gluconate Cloth 2 % PADS 6 each (has no administration in time range)  amiodarone (NEXTERONE) IV bolus only 150 mg/100 mL (0 mg Intravenous Stopped 12/04/18 2259)     Initial Impression / Assessment and Plan / ED Course  I have reviewed the triage vital signs and the nursing notes.  Pertinent labs & imaging results that were available during my care of the patient were reviewed by me and considered in my medical decision making (see chart for details).    [facility] [place] is a 78 y.o. male here with SOB. Likely pulmonary edema from either rapid afib or from missing his dialysis session earlier today. Will get labs, BNP, trop. Will consult nephrology for dialysis.   11:12 PM Patient's VBG reassuring on bipap. I talked to nephrology, who will dialyze patient. I talked to cardiology (Dr. Georgette Shell) about CHF and afib management. She agreed with amiodarone drip. Recommend urgent dialysis. Hospitalist to admit for pulmonary edema.    Final Clinical Impressions(s) / ED Diagnoses   Final diagnoses:  None    ED Discharge Orders    None       Drenda Freeze, MD 12/04/18 2328

## 2018-12-05 ENCOUNTER — Encounter (HOSPITAL_COMMUNITY): Payer: Self-pay | Admitting: Internal Medicine

## 2018-12-05 DIAGNOSIS — J810 Acute pulmonary edema: Secondary | ICD-10-CM

## 2018-12-05 DIAGNOSIS — I420 Dilated cardiomyopathy: Secondary | ICD-10-CM | POA: Diagnosis present

## 2018-12-05 DIAGNOSIS — N186 End stage renal disease: Secondary | ICD-10-CM

## 2018-12-05 DIAGNOSIS — Z21 Asymptomatic human immunodeficiency virus [HIV] infection status: Secondary | ICD-10-CM

## 2018-12-05 DIAGNOSIS — M109 Gout, unspecified: Secondary | ICD-10-CM | POA: Diagnosis present

## 2018-12-05 DIAGNOSIS — Z79899 Other long term (current) drug therapy: Secondary | ICD-10-CM | POA: Diagnosis not present

## 2018-12-05 DIAGNOSIS — G8929 Other chronic pain: Secondary | ICD-10-CM | POA: Diagnosis present

## 2018-12-05 DIAGNOSIS — R402363 Coma scale, best motor response, obeys commands, at hospital admission: Secondary | ICD-10-CM | POA: Diagnosis present

## 2018-12-05 DIAGNOSIS — I447 Left bundle-branch block, unspecified: Secondary | ICD-10-CM | POA: Diagnosis present

## 2018-12-05 DIAGNOSIS — R402143 Coma scale, eyes open, spontaneous, at hospital admission: Secondary | ICD-10-CM | POA: Diagnosis present

## 2018-12-05 DIAGNOSIS — I4891 Unspecified atrial fibrillation: Secondary | ICD-10-CM | POA: Diagnosis not present

## 2018-12-05 DIAGNOSIS — Z87891 Personal history of nicotine dependence: Secondary | ICD-10-CM | POA: Diagnosis not present

## 2018-12-05 DIAGNOSIS — I5023 Acute on chronic systolic (congestive) heart failure: Secondary | ICD-10-CM | POA: Diagnosis present

## 2018-12-05 DIAGNOSIS — J9601 Acute respiratory failure with hypoxia: Secondary | ICD-10-CM | POA: Diagnosis present

## 2018-12-05 DIAGNOSIS — I132 Hypertensive heart and chronic kidney disease with heart failure and with stage 5 chronic kidney disease, or end stage renal disease: Secondary | ICD-10-CM | POA: Diagnosis present

## 2018-12-05 DIAGNOSIS — G4730 Sleep apnea, unspecified: Secondary | ICD-10-CM | POA: Diagnosis present

## 2018-12-05 DIAGNOSIS — Z8546 Personal history of malignant neoplasm of prostate: Secondary | ICD-10-CM | POA: Diagnosis not present

## 2018-12-05 DIAGNOSIS — Z7901 Long term (current) use of anticoagulants: Secondary | ICD-10-CM | POA: Diagnosis not present

## 2018-12-05 DIAGNOSIS — I482 Chronic atrial fibrillation, unspecified: Secondary | ICD-10-CM | POA: Diagnosis present

## 2018-12-05 DIAGNOSIS — Z8673 Personal history of transient ischemic attack (TIA), and cerebral infarction without residual deficits: Secondary | ICD-10-CM | POA: Diagnosis not present

## 2018-12-05 DIAGNOSIS — Z8674 Personal history of sudden cardiac arrest: Secondary | ICD-10-CM | POA: Diagnosis not present

## 2018-12-05 DIAGNOSIS — H409 Unspecified glaucoma: Secondary | ICD-10-CM | POA: Diagnosis present

## 2018-12-05 DIAGNOSIS — I43 Cardiomyopathy in diseases classified elsewhere: Secondary | ICD-10-CM | POA: Diagnosis present

## 2018-12-05 DIAGNOSIS — D631 Anemia in chronic kidney disease: Secondary | ICD-10-CM | POA: Diagnosis present

## 2018-12-05 DIAGNOSIS — R402253 Coma scale, best verbal response, oriented, at hospital admission: Secondary | ICD-10-CM | POA: Diagnosis present

## 2018-12-05 DIAGNOSIS — M549 Dorsalgia, unspecified: Secondary | ICD-10-CM | POA: Diagnosis present

## 2018-12-05 LAB — BASIC METABOLIC PANEL
Anion gap: 16 — ABNORMAL HIGH (ref 5–15)
BUN: 61 mg/dL — ABNORMAL HIGH (ref 8–23)
CO2: 26 mmol/L (ref 22–32)
Calcium: 8.9 mg/dL (ref 8.9–10.3)
Chloride: 100 mmol/L (ref 98–111)
Creatinine, Ser: 10.22 mg/dL — ABNORMAL HIGH (ref 0.61–1.24)
GFR calc Af Amer: 5 mL/min — ABNORMAL LOW (ref >60)
GFR calc non Af Amer: 4 mL/min — ABNORMAL LOW (ref >60)
Glucose, Bld: 112 mg/dL — ABNORMAL HIGH (ref 70–99)
Potassium: 4.7 mmol/L (ref 3.5–5.1)
Sodium: 142 mmol/L (ref 135–145)

## 2018-12-05 LAB — CBC
HCT: 35.1 % — ABNORMAL LOW (ref 39.0–52.0)
Hemoglobin: 11.1 g/dL — ABNORMAL LOW (ref 13.0–17.0)
MCH: 33.8 pg (ref 26.0–34.0)
MCHC: 31.6 g/dL (ref 30.0–36.0)
MCV: 107 fL — ABNORMAL HIGH (ref 80.0–100.0)
Platelets: 208 10*3/uL (ref 150–400)
RBC: 3.28 MIL/uL — AB (ref 4.22–5.81)
RDW: 14.1 % (ref 11.5–15.5)
WBC: 8.7 10*3/uL (ref 4.0–10.5)
nRBC: 0 % (ref 0.0–0.2)

## 2018-12-05 LAB — TROPONIN I
TROPONIN I: 0.35 ng/mL — AB (ref <0.03)
Troponin I: 0.3 ng/mL (ref <0.03)

## 2018-12-05 LAB — PROCALCITONIN: PROCALCITONIN: 1.6 ng/mL

## 2018-12-05 LAB — PROTIME-INR
INR: 2.13
Prothrombin Time: 23.6 seconds — ABNORMAL HIGH (ref 11.4–15.2)

## 2018-12-05 MED ORDER — ALLOPURINOL 100 MG PO TABS
100.0000 mg | ORAL_TABLET | Freq: Every evening | ORAL | Status: DC
  Administered 2018-12-05: 100 mg via ORAL
  Filled 2018-12-05: qty 1

## 2018-12-05 MED ORDER — MIDODRINE HCL 5 MG PO TABS
10.0000 mg | ORAL_TABLET | ORAL | Status: DC
  Administered 2018-12-05 (×2): 10 mg via ORAL
  Filled 2018-12-05: qty 2

## 2018-12-05 MED ORDER — CHLORHEXIDINE GLUCONATE CLOTH 2 % EX PADS
6.0000 | MEDICATED_PAD | Freq: Every day | CUTANEOUS | Status: DC
  Administered 2018-12-05 – 2018-12-06 (×2): 6 via TOPICAL

## 2018-12-05 MED ORDER — FLUTICASONE PROPIONATE 50 MCG/ACT NA SUSP
2.0000 | Freq: Every day | NASAL | Status: DC
  Filled 2018-12-05: qty 16

## 2018-12-05 MED ORDER — FERRIC CITRATE 1 GM 210 MG(FE) PO TABS
420.0000 mg | ORAL_TABLET | Freq: Three times a day (TID) | ORAL | Status: DC
  Administered 2018-12-05 (×2): 420 mg via ORAL
  Filled 2018-12-05 (×2): qty 2

## 2018-12-05 MED ORDER — SODIUM CHLORIDE 0.9 % IV SOLN: 100.0000 mL | INTRAVENOUS | Status: DC | PRN

## 2018-12-05 MED ORDER — LIDOCAINE HCL (PF) 1 % IJ SOLN
5.0000 mL | INTRAMUSCULAR | Status: DC | PRN
  Filled 2018-12-05: qty 5

## 2018-12-05 MED ORDER — ALTEPLASE 2 MG IJ SOLR
2.0000 mg | Freq: Once | INTRAMUSCULAR | Status: DC | PRN
  Filled 2018-12-05: qty 2

## 2018-12-05 MED ORDER — MONTELUKAST SODIUM 10 MG PO TABS
10.0000 mg | ORAL_TABLET | Freq: Every day | ORAL | Status: DC
  Administered 2018-12-05: 10 mg via ORAL
  Filled 2018-12-05: qty 1

## 2018-12-05 MED ORDER — MIDODRINE HCL 5 MG PO TABS
ORAL_TABLET | ORAL | Status: AC
  Filled 2018-12-05: qty 2

## 2018-12-05 MED ORDER — EMTRICITABINE-TENOFOVIR AF 200-25 MG PO TABS
1.0000 | ORAL_TABLET | Freq: Every day | ORAL | Status: DC
  Administered 2018-12-05: 1 via ORAL
  Filled 2018-12-05 (×3): qty 1

## 2018-12-05 MED ORDER — LATANOPROST 0.005 % OP SOLN
1.0000 [drp] | Freq: Every day | OPHTHALMIC | Status: DC
  Administered 2018-12-05: 1 [drp] via OPHTHALMIC
  Filled 2018-12-05: qty 2.5

## 2018-12-05 MED ORDER — WARFARIN SODIUM 7.5 MG PO TABS
7.5000 mg | ORAL_TABLET | Freq: Once | ORAL | Status: AC
  Administered 2018-12-05: 7.5 mg via ORAL
  Filled 2018-12-05: qty 1

## 2018-12-05 MED ORDER — AMIODARONE HCL 200 MG PO TABS
200.0000 mg | ORAL_TABLET | Freq: Every day | ORAL | Status: DC
  Administered 2018-12-05: 200 mg via ORAL
  Filled 2018-12-05: qty 1

## 2018-12-05 MED ORDER — ONDANSETRON HCL 4 MG PO TABS: 4.0000 mg | ORAL_TABLET | Freq: Four times a day (QID) | ORAL | Status: DC | PRN

## 2018-12-05 MED ORDER — DORZOLAMIDE HCL-TIMOLOL MAL 2-0.5 % OP SOLN
1.0000 [drp] | Freq: Two times a day (BID) | OPHTHALMIC | Status: DC
  Administered 2018-12-05 (×2): 1 [drp] via OPHTHALMIC
  Filled 2018-12-05: qty 10

## 2018-12-05 MED ORDER — LIDOCAINE-PRILOCAINE 2.5-2.5 % EX CREA
1.0000 "application " | TOPICAL_CREAM | CUTANEOUS | Status: DC | PRN
  Filled 2018-12-05: qty 5

## 2018-12-05 MED ORDER — PENTAFLUOROPROP-TETRAFLUOROETH EX AERO: 1.0000 "application " | INHALATION_SPRAY | CUTANEOUS | Status: DC | PRN

## 2018-12-05 MED ORDER — ACETAMINOPHEN 325 MG PO TABS: 650.0000 mg | ORAL_TABLET | Freq: Four times a day (QID) | ORAL | Status: DC | PRN

## 2018-12-05 MED ORDER — HEPARIN SODIUM (PORCINE) 1000 UNIT/ML IJ SOLN
INTRAMUSCULAR | Status: AC
  Filled 2018-12-05: qty 4

## 2018-12-05 MED ORDER — WARFARIN - PHARMACIST DOSING INPATIENT
Freq: Every day | Status: DC
  Administered 2018-12-05: 19:00:00

## 2018-12-05 MED ORDER — RENA-VITE PO TABS
1.0000 | ORAL_TABLET | Freq: Every day | ORAL | Status: DC
  Administered 2018-12-05: 1 via ORAL
  Filled 2018-12-05: qty 1

## 2018-12-05 MED ORDER — ACETAMINOPHEN 650 MG RE SUPP: 650.0000 mg | Freq: Four times a day (QID) | RECTAL | Status: DC | PRN

## 2018-12-05 MED ORDER — ONDANSETRON HCL 4 MG/2ML IJ SOLN: 4.0000 mg | Freq: Four times a day (QID) | INTRAMUSCULAR | Status: DC | PRN

## 2018-12-05 MED ORDER — BRIMONIDINE TARTRATE 0.15 % OP SOLN
1.0000 [drp] | Freq: Two times a day (BID) | OPHTHALMIC | Status: DC
  Administered 2018-12-05 (×2): 1 [drp] via OPHTHALMIC
  Filled 2018-12-05: qty 5

## 2018-12-05 MED ORDER — HEPARIN SODIUM (PORCINE) 1000 UNIT/ML DIALYSIS
1000.0000 [IU] | INTRAMUSCULAR | Status: DC | PRN
  Filled 2018-12-05: qty 1

## 2018-12-05 MED ORDER — DOLUTEGRAVIR SODIUM 50 MG PO TABS
50.0000 mg | ORAL_TABLET | Freq: Every day | ORAL | Status: DC
  Administered 2018-12-05: 50 mg via ORAL
  Filled 2018-12-05 (×2): qty 1

## 2018-12-05 NOTE — Progress Notes (Signed)
Patient admitted for pulmonary edema from ED, arrived into the unit at about 440am.  Patient is alert and oriented, able to follow commands. Patient has BIPAP on from ED, patient was connected to progressive monitor Vital signs stable skin assessment done with another RN, skin intact, no sign of distress noted.

## 2018-12-05 NOTE — Procedures (Signed)
I was present at this dialysis session. I have reviewed the session itself and made appropriate changes.   Seen on HD. Initial 3L UF goal. Remains on BiPAP with BP normal and oxygenating well.  After 2L UF will try off BiPAP.   Given sCHF and LVEF 15% I suspect he is very sensitive to changes in volume status; or to arrhythmias.  Will probe down post weights as BP permits.  No obvious peripheral edema on exam.     Filed Weights   12/04/18 2212 12/05/18 0514 12/05/18 0655  Weight: 86.2 kg 85 kg 86.6 kg    Recent Labs  Lab 12/05/18 0308  NA 142  K 4.7  CL 100  CO2 26  GLUCOSE 112*  BUN 61*  CREATININE 10.22*  CALCIUM 8.9    Recent Labs  Lab 12/04/18 2207 12/04/18 2225 12/05/18 0308  WBC 11.9*  --  8.7  NEUTROABS 5.4  --   --   HGB 12.9* 15.0 11.1*  HCT 41.4 44.0 35.1*  MCV 108.1*  --  107.0*  PLT 255  --  208    Scheduled Meds: . allopurinol  100 mg Oral QPM  . amiodarone  200 mg Oral Daily  . brimonidine  1 drop Left Eye BID  . Chlorhexidine Gluconate Cloth  6 each Topical Q0600  . diltiazem  20 mg Intravenous Once  . dolutegravir  50 mg Oral Daily  . dorzolamide-timolol  1 drop Left Eye BID  . emtricitabine-tenofovir AF  1 tablet Oral Daily  . ferric citrate  420 mg Oral TID WC  . fluticasone  2 spray Each Nare Daily  . latanoprost  1 drop Left Eye QHS  . midodrine  10 mg Oral 2 times per day on Tue Thu Sat  . montelukast  10 mg Oral QHS  . multivitamin  1 tablet Oral Daily  . warfarin  7.5 mg Oral ONCE-1800  . Warfarin - Pharmacist Dosing Inpatient   Does not apply q1800   Continuous Infusions: PRN Meds:.acetaminophen **OR** acetaminophen, ondansetron **OR** ondansetron (ZOFRAN) IV   Pearson Grippe  MD 12/05/2018, 8:49 AM

## 2018-12-05 NOTE — Progress Notes (Signed)
PROGRESS NOTE                                                                                                                                                                                                             Patient Demographics:    David [name], is a 78 y.o. male, DOB - 07-14-1941, YHC:[id number]  Admit date - 12/04/2018   Admitting Physician Rise Patience, MD  Outpatient Primary MD for the patient is David Alar, NP  LOS - 0  Chief Complaint  Patient presents with  . Shortness of Breath       Brief Narrative    David [name] is a 78 y.o. male with known history of A. fib, nonischemic cardiomyopathy last EF measured was 15 to 20% in March 2019, ESRD on hemodialysis on Tuesday Thursday Saturday and schedule has been changed for holiday last week and has received last dialysis on Monday 2 days ago started having increasing shortness of breath with chest congestion but no chest pain no productive cough fever chills over the last 24 hours.    He denied any dietary indiscretion or medication noncompliance.  In the ER he was found to be in pulmonary edema and acute hypoxic respiratory failure, he was placed on BiPAP, briefly he was found to be in A. fib RVR, cardiology fellow was consulted who placed him briefly on amiodarone drip.  Nephrology was urgently consulted for dialysis and he was admitted to the hospital.    Subjective:    David [name] today has, No headache, No chest pain, No abdominal pain - No Nausea, No new weakness tingling or numbness, No Cough - improved SOB.     Assessment  & Plan :     1.  Acute hypoxic respiratory failure caused by pulmonary edema due to acute on chronic systolic heart failure EF 20% ESRD patient.    Currently on BiPAP and getting dialyzed, fluid removal through dialysis, he is already feeling better will try to titrate him down to nasal cannula oxygen.  Case discussed with allergist Dr. Joelyn Oms, will also  consult cardiology.  Continue supportive care.  2.  Acute on chronic systolic CHF due to nonischemic cardiomyopathy Baseline EF 15 to 20% in March 2019.  Fluid management through HD, currently on combination of oral amiodarone, not on beta-blocker, ACE/ARB due to baseline low blood pressures.  Cardiology has been consulted.  3.  Chronic A. fib was in RVR when he came.  He is baseline on oral  amiodarone along with Coumadin.  Briefly placed on amiodarone drip by cardiology fellow in the ER, goal will be rate control.  Will request cardiology to evaluate as well as nephrology was wondering if intermittent RVR is pushing him into pulmonary edema.  Monitor on telemetry.  4.  Anemia of chronic disease.  Monitor.  5.  HX of HIV.  Last CD4 count in July 2019 was 380.  Continue present regimen, post discharge follow-up with ID outpatient.  6.  History of gout.  Continue home dose allopurinol.    Family Communication  :  none  Code Status :  Full  Disposition Plan  :  Home 1-2 days  Consults  :  Renal, Cards  Procedures  :      DVT Prophylaxis  :  Coumadin  Lab Results  Component Value Date   INR 2.13 12/05/2018   INR 1.88 12/04/2018   INR 2.2 11/20/2018     Lab Results  Component Value Date   PLT 208 12/05/2018    Diet :  Diet Order            Diet renal with fluid restriction Fluid restriction: 1200 mL Fluid; Room service appropriate? Yes; Fluid consistency: Thin  Diet effective now               Inpatient Medications Scheduled Meds: . midodrine      . allopurinol  100 mg Oral QPM  . amiodarone  200 mg Oral Daily  . brimonidine  1 drop Left Eye BID  . Chlorhexidine Gluconate Cloth  6 each Topical Q0600  . diltiazem  20 mg Intravenous Once  . dolutegravir  50 mg Oral Daily  . dorzolamide-timolol  1 drop Left Eye BID  . emtricitabine-tenofovir AF  1 tablet Oral Daily  . ferric citrate  420 mg Oral TID WC  . fluticasone  2 spray Each Nare Daily  . latanoprost  1  drop Left Eye QHS  . midodrine  10 mg Oral 2 times per day on Tue Thu Sat  . montelukast  10 mg Oral QHS  . multivitamin  1 tablet Oral Daily  . warfarin  7.5 mg Oral ONCE-1800  . Warfarin - Pharmacist Dosing Inpatient   Does not apply q1800   Continuous Infusions: . sodium chloride    . sodium chloride     PRN Meds:.sodium chloride, sodium chloride, acetaminophen **OR** acetaminophen, alteplase, heparin, lidocaine (PF), lidocaine-prilocaine, ondansetron **OR** ondansetron (ZOFRAN) IV, pentafluoroprop-tetrafluoroeth  Antibiotics  :   Anti-infectives (From admission, onward)   Start     Dose/Rate Route Frequency Ordered Stop   12/05/18 1000  emtricitabine-tenofovir AF (DESCOVY) 200-25 MG per tablet 1 tablet     1 tablet Oral Daily 12/05/18 0033     12/05/18 1000  dolutegravir (TIVICAY) tablet 50 mg     50 mg Oral Daily 12/05/18 0033            Objective:   Vitals:   12/05/18 0900 12/05/18 0915 12/05/18 0917 12/05/18 0930  BP: 115/66 (!) 82/47 (!) 96/40 108/62  Pulse: 81 88 76 86  Resp: 20   17  Temp:      TempSrc:      SpO2: 100% 100% 100% 100%  Weight:      Height:        Wt Readings from Last 3 Encounters:  12/05/18 86.6 kg  11/13/18 86.2 kg  11/11/18 87 kg    No intake or output data in the 24 hours  ending 12/05/18 1003   Physical Exam  Awake Alert, Oriented X 3, No new F.N deficits, Normal affect [name],PERRAL Supple Neck,No JVD, No cervical lymphadenopathy appriciated.  Symmetrical Chest wall movement, Good air movement bilaterally, +ve rales RRR,No Gallops,Rubs or new Murmurs, No Parasternal Heave +ve B.Sounds, Abd Soft, No tenderness, No organomegaly appriciated, No rebound - guarding or rigidity. No Cyanosis, Clubbing or edema, No new Rash or bruise       Data Review:    CBC Recent Labs  Lab 12/04/18 2207 12/04/18 2225 12/05/18 0308  WBC 11.9*  --  8.7  HGB 12.9* 15.0 11.1*  HCT 41.4 44.0 35.1*  PLT 255  --  208  MCV 108.1*  --  107.0*    MCH 33.7  --  33.8  MCHC 31.2  --  31.6  RDW 14.3  --  14.1  LYMPHSABS 5.5*  --   --   MONOABS 0.6  --   --   EOSABS 0.4  --   --   BASOSABS 0.1  --   --     Chemistries  Recent Labs  Lab 12/04/18 2207 12/04/18 2225 12/05/18 0308  NA 144 140 142  K 4.2 4.2 4.7  CL 97* 100 100  CO2 28  --  26  GLUCOSE 174* 168* 112*  BUN 72* 57* 61*  CREATININE 9.90* 10.20* 10.22*  CALCIUM 9.5  --  8.9  AST 39  --   --   ALT 34  --   --   ALKPHOS 121  --   --   BILITOT 0.4  --   --    ------------------------------------------------------------------------------------------------------------------ No results for input(s): CHOL, HDL, LDLCALC, TRIG, CHOLHDL, LDLDIRECT in the last 72 hours.  Lab Results  Component Value Date   HGBA1C 6.1 (H) 01/21/2018   ------------------------------------------------------------------------------------------------------------------ No results for input(s): TSH, T4TOTAL, T3FREE, THYROIDAB in the last 72 hours.  Invalid input(s): FREET3 ------------------------------------------------------------------------------------------------------------------ No results for input(s): VITAMINB12, FOLATE, FERRITIN, TIBC, IRON, RETICCTPCT in the last 72 hours.  Coagulation profile Recent Labs  Lab 12/04/18 2207 12/05/18 0308  INR 1.88 2.13    No results for input(s): DDIMER in the last 72 hours.  Cardiac Enzymes No results for input(s): CKMB, TROPONINI, MYOGLOBIN in the last 168 hours.  Invalid input(s): CK ------------------------------------------------------------------------------------------------------------------    Component Value Date/Time   BNP 1,630.5 (H) 12/04/2018 2207    Micro Results No results found for this or any previous visit (from the past 240 hour(s)).  Radiology Reports Dg Chest Port 1 View  Result Date: 12/04/2018 CLINICAL DATA:  Shortness of breath EXAM: PORTABLE CHEST 1 VIEW COMPARISON:  11/01/2018, 10/21/2018 FINDINGS:  Right-sided central venous catheter tip over the SVC. Cardiomegaly with vascular congestion and hazy and ground-glass opacities suspicious for pulmonary edema. More confluent opacity at the bases. No pneumothorax. IMPRESSION: 1. Cardiomegaly with vascular congestion and moderate pulmonary edema 2. Superimposed more confluent atelectasis or pneumonia at both bases. Electronically Signed   By: Donavan Foil M.D.   On: 12/04/2018 22:28    Time Spent in minutes  30   Lala Lund M.D on 12/05/2018 at 10:03 AM  To page go to [email or web address] - password [facility]

## 2018-12-05 NOTE — Progress Notes (Signed)
ANTICOAGULATION CONSULT NOTE - Initial Consult  Pharmacy Consult for Coumadin Indication: atrial fibrillation  Allergies  Allergen Reactions  . Cozaar [Losartan Potassium] Other (See Comments)    Causes constipation     Patient Measurements: Height: 6\' 2"  (188 cm) Weight: 190 lb 0.6 oz (86.2 kg) IBW/kg (Calculated) : 82.2  Vital Signs: BP: 117/85 (01/02 0100) Pulse Rate: 90 (01/02 0100)  Labs: Recent Labs    12/04/18 2207 12/04/18 2225  HGB 12.9* 15.0  HCT 41.4 44.0  PLT 255  --   LABPROT 21.4*  --   INR 1.88  --   CREATININE 9.90* 10.20*    Estimated Creatinine Clearance: 7.2 mL/min (A) (by C-G formula based on SCr of 10.2 mg/dL (H)).   Medical History: Past Medical History:  Diagnosis Date  . Acute on chronic systolic and diastolic heart failure, NYHA class 4 ([place])   . Anemia, iron deficiency 11/15/2011  . Arthritis    "hands, right knee, feet" (02/21/2018)  . Atrial fibrillation ([place])   . Cancer [facility])    hx of prostate; s/p radioactive seed implant 10/2009 Dr Janice Norrie  . Cardiac arrest ([place]) 02/17/2018  . CHF (congestive heart failure) ([place])   . Chronic lower back pain   . CKD (chronic kidney disease) stage V requiring chronic dialysis ([place])   . ESRD on dialysis ([place])    started 02/2018  . GERD (gastroesophageal reflux disease)   . Glaucoma   . Gout    daily RX (02/21/2018)  . Heart murmur    "mild" per pt  . Hepatitis    years ago  . History of cardiac cath 2004   negative for CAD  . History of myocardial perfusion scan 02/2010   negative for coronary insufficiency (LVEF 27%)  . HIV (human immunodeficiency virus infection) ([place])   . HIV infection ([place])   . Hyperlipidemia   . Hypertension    followed by [name] (Dr Dani Gobble Croitoru)  . Hypertension   . Nonischemic cardiomyopathy ([place])   . Pneumonia 11/2017  . Prostate cancer ([place])   . Sinus bradycardia   . Sleep apnea    does not use a cpap  . Stroke [facility])    "mini stroke"  years ago  . Systolic and diastolic CHF, acute ([place]) 06/2010   felt to be secondary to hypertensive cardiomyopathy    Assessment: 78yo male to continue Coumadin for Afib during admission for pulmonary edema; current INR slightly below goal w/ last dose of Coumadin taken 1/1 PTA.  Goal of Therapy:  INR 2-3   Plan:  Will give boosted Coumadin dose of 7.5mg  PO x1 today and monitor INR for dose adjustments.  Wynona Neat, PharmD, BCPS  12/05/2018,1:38 AM

## 2018-12-05 NOTE — ED Notes (Signed)
Attempted to call report to floor 

## 2018-12-05 NOTE — Discharge Instructions (Addendum)

## 2018-12-05 NOTE — H&P (Signed)
History and Physical    [facility] YQI:[id number] DOB: 30-Jun-1941 DOA: 12/04/2018  PCP: [name], NP  Patient coming from: Home.  Chief Complaint: Shortness of breath.  HPI: David [name] is a 78 y.o. male with known history of A. fib, nonischemic cardiomyopathy last EF measured was 15 to 20% in March 2019, ESRD on hemodialysis on Tuesday Thursday Saturday and schedule has been changed for holiday last week and has received last dialysis on Monday 2 days ago started having increasing shortness of breath with chest congestion but no chest pain no productive cough fever chills over the last 24 hours.  Patient states he has not been eating more than usual and has been compliant with his diet and medication.  ED Course: In the ER patient was hypoxic with chest x-ray showing pulmonary edema.  EKG was showing tachycardia and this was discussed by the ER physician with on-call cardiologist and given the history of A. fib patient was started on amiodarone drip.  Nephrology has been consulted for urgent dialysis.  Patient has been placed on BiPAP.  Chest x-ray does show features for possible pneumonia but patient does not having any fever or productive cough or leukocytosis.  Review of Systems: As per HPI, rest all negative.   Past Medical History:  Diagnosis Date  . Acute on chronic systolic and diastolic heart failure, NYHA class 4 ([place])   . Anemia, iron deficiency 11/15/2011  . Arthritis    "hands, right knee, feet" (02/21/2018)  . Atrial fibrillation ([place])   . Cancer [facility])    hx of prostate; s/p radioactive seed implant 10/2009 Dr Janice Norrie  . Cardiac arrest ([place]) 02/17/2018  . CHF (congestive heart failure) ([place])   . Chronic lower back pain   . CKD (chronic kidney disease) stage V requiring chronic dialysis ([place])   . ESRD on dialysis ([place])    started 02/2018  . GERD (gastroesophageal reflux disease)   . Glaucoma   . Gout    daily RX (02/21/2018)  . Heart murmur    "mild" per pt  . Hepatitis    years ago  . History of cardiac cath 2004   negative for CAD  . History of myocardial perfusion scan 02/2010   negative for coronary insufficiency (LVEF 27%)  . HIV (human immunodeficiency virus infection) ([place])   . HIV infection ([place])   . Hyperlipidemia   . Hypertension    followed by [facility] (Dr Dani Gobble Croitoru)  . Hypertension   . Nonischemic cardiomyopathy ([place])   . Pneumonia 11/2017  . Prostate cancer ([place])   . Sinus bradycardia   . Sleep apnea    does not use a cpap  . Stroke [facility])    "mini stroke" years ago  . Systolic and diastolic CHF, acute ([place]) 06/2010   felt to be secondary to hypertensive cardiomyopathy    Past Surgical History:  Procedure Laterality Date  . AV FISTULA PLACEMENT Right 10/13/2016   Procedure: ARTERIOVENOUS (AV) FISTULA CREATION;  Surgeon: [name], MD;  Location: [place];  Service: Vascular;  Laterality: Right;  . AV FISTULA PLACEMENT Right 10/13/2016   [name] [phone]  . AV FISTULA PLACEMENT Left 02/25/2018   Procedure: INSERTION OF ARTERIOVENOUS (AV) GORE-TEX GRAFT LEFT UPPER ARM;  Surgeon: Angelia Mould, MD;  Location: [place];  Service: Vascular;  Laterality: Left;  . AV FISTULA PLACEMENT Right 08/07/2018   Procedure: Creation of right arm brachiocephalic Fistula;  Surgeon: Waynetta Sandy, MD;  Location: [facility] OR;  Service: Vascular;  Laterality: Right;  . BASCILIC VEIN TRANSPOSITION Left 06/24/2015   Procedure: BASILIC VEIN TRANSPOSITION;  Surgeon: Mal Misty, MD;  Location: [place];  Service: Vascular;  Laterality: Left;  . BASCILIC VEIN TRANSPOSITION Left 3/78/5885 Bascilic vein transposition (Left)   [name] [phone]  . CARDIAC CATHETERIZATION  02/04/2003   mildly depressed LV systolic fx EF 86%,VEHMCN coronaries/abdominal aorta/renal arteries.  Marland Kitchen CARDIAC CATHETERIZATION  2004  . CATARACT EXTRACTION, BILATERAL Bilateral   . COLONOSCOPY    . ESOPHAGOGASTRODUODENOSCOPY (EGD)  WITH PROPOFOL N/A 05/05/2014   Procedure: ESOPHAGOGASTRODUODENOSCOPY (EGD) WITH PROPOFOL;  Surgeon: Missy Sabins, MD;  Location: [facility];  Service: Endoscopy;  Laterality: N/A;  . EYE SURGERY Bilateral    cataract surgery   . EYE SURGERY Bilateral    glaucoma surgery  . EYE SURGERY    . GLAUCOMA SURGERY Bilateral   . INSERTION OF ARTERIOVENOUS (AV) ARTEGRAFT ARM  10/09/2018   Procedure: INSERTION OF ARTERIOVENOUS (AV) 53mm x 41cm ARTEGRAFT LEFT UPPER ARM;  Surgeon: Waynetta Sandy, MD;  Location: [place];  Service: Vascular;;  . INSERTION PROSTATE RADIATION SEED    . IR FLUORO GUIDE CV LINE RIGHT  02/18/2018  . IR US GUIDE VASC ACCESS RIGHT  02/18/2018  . LEFT HEART CATH AND CORONARY ANGIOGRAPHY N/A 02/20/2018   Procedure: LEFT HEART CATH AND CORONARY ANGIOGRAPHY;  Surgeon: Jettie Booze, MD;  Location: [place] CV LAB;  Service: Cardiovascular;  Laterality: N/A;  . NM MYOCAR PERF WALL MOTION  02/21/2010   normal  . RADIOACTIVE SEED IMPLANT  2010   prostate cancer  . THROMBECTOMY W/ EMBOLECTOMY Left 02/26/2018   Procedure: THROMBECTOMY ARTERIOVENOUS GRAFT;  Surgeon: Angelia Mould, MD;  Location: [place];  Service: Vascular;  Laterality: Left;  . ULTRASOUND GUIDANCE FOR VASCULAR ACCESS  02/20/2018   Procedure: Ultrasound Guidance For Vascular Access;  Surgeon: Jettie Booze, MD;  Location: [place] CV LAB;  Service: Cardiovascular;;  . UPPER EXTREMITY VENOGRAPHY N/A 07/08/2018   Procedure: UPPER EXTREMITY VENOGRAPHY;  Surgeon: Waynetta Sandy, MD;  Location: [place] CV LAB;  Service: Cardiovascular;  Laterality: N/A;  Bilateral  . US ECHOCARDIOGRAPHY  02/06/2012   mild LVH,LA mod. dilated,mild-mod. 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 AI.     reports that he quit smoking about 14 years ago. His smoking use included cigarettes. He has a 30.00 pack-year smoking history. He has never used smokeless tobacco. He reports  current alcohol use. He reports that he does not use drugs.  Allergies  Allergen Reactions  . Cozaar [Losartan Potassium] Other (See Comments)    Causes constipation     Family History  Problem Relation Age of Onset  . Hypertension Mother   . Thyroid disease Mother   . Cholelithiasis Daughter   . Cholelithiasis Son   . Hypertension Maternal Grandmother   . Diabetes Maternal Grandmother   . Heart attack Neg Hx   . Hyperlipidemia Neg Hx     Prior to Admission medications   Medication Sig Start Date End Date Taking? Authorizing Provider  acetaminophen (TYLENOL) 500 MG tablet Take 1,000 mg by mouth every 6 (six) hours as needed for mild pain.    [provider]  allopurinol (ZYLOPRIM) 100 MG tablet Take 100 mg by mouth every evening.  06/27/18   [provider]  amiodarone (PACERONE) 200 MG tablet TAKE 1 TABLET(200 MG) BY MOUTH DAILY Patient taking differently: Take 200 mg  by mouth daily.  10/29/18   Croitoru, Mihai, MD  AURYXIA 1 GM 210 MG(Fe) tablet Take 420 mg by mouth 3 (three) times daily with meals.  08/05/18   [provider]  brimonidine (ALPHAGAN) 0.15 % ophthalmic solution Place 1 drop into the left eye 2 (two) times daily. 11/15/17   [provider]  DESCOVY 200-25 MG tablet Take 1 tablet by mouth daily.  01/20/18   [provider]  Dextromethorphan-guaiFENesin (CORICIDIN HBP CONGESTION/COUGH PO) Take 1 tablet by mouth every 8 (eight) hours as needed (cold symptoms).    [provider]  dorzolamide-timolol (COSOPT) 22.3-6.8 MG/ML ophthalmic solution Place 1 drop into the left eye 2 (two) times daily. 12/17/17   [provider]  fluticasone (FLONASE) 50 MCG/ACT nasal spray Place 2 sprays into both nostrils daily. 11/13/18   [name], NP  latanoprost (XALATAN) 0.005 % ophthalmic solution Place 1 drop into the left eye at bedtime.     [provider]  midodrine (PROAMATINE) 10 MG tablet Take 1 tablet  (10 mg total) by mouth as directed. Twice a day on the day of HD Tuesday, Thursday, Saturday. 10/23/18   Lavina Hamman, MD  montelukast (SINGULAIR) 10 MG tablet Take 1 tablet (10 mg total) by mouth at bedtime. 08/12/18   [name], NP  multivitamin (RENA-VIT) TABS tablet Take 1 tablet by mouth daily.    [provider]  TIVICAY 50 MG tablet Take 50 mg by mouth daily.  01/20/18   [provider]  Vitamin D, Ergocalciferol, (DRISDOL) 50000 UNITS CAPS capsule Take 50,000 Units by mouth every Friday.  04/10/15   [provider]  warfarin (COUMADIN) 10 MG tablet Take 10 mg by mouth daily.    [provider]    Physical Exam: Vitals:   12/04/18 2345 12/05/18 0000 12/05/18 0008 12/05/18 0015  BP: 126/77 125/77  137/87  Pulse: (!) 51 (!) 49 96 96  Resp: (!) 23 (!) 23 (!) 23 (!) 24  SpO2: 100% 100% 100% 99%  Weight:      Height:          Constitutional: Moderately built and nourished. Vitals:   12/04/18 2345 12/05/18 0000 12/05/18 0008 12/05/18 0015  BP: 126/77 125/77  137/87  Pulse: (!) 51 (!) 49 96 96  Resp: (!) 23 (!) 23 (!) 23 (!) 24  SpO2: 100% 100% 100% 99%  Weight:      Height:       Eyes: Anicteric no pallor. ENMT: No discharge from the ears eyes nose or mouth. Neck: No mass or.  No neck rigidity. Respiratory: No rhonchi or crepitations. Cardiovascular: S1-S2 heard. Abdomen: Soft nontender bowel sounds present. Musculoskeletal: No edema.  No joint effusion. Skin: No rash. Neurologic: Alert awake oriented to time place and person.  Moves all extremities. Psychiatric: Appears normal.   Labs on Admission: I have personally reviewed following labs and imaging studies  CBC: Recent Labs  Lab 12/04/18 2207 12/04/18 2225  WBC 11.9*  --   NEUTROABS 5.4  --   HGB 12.9* 15.0  HCT 41.4 44.0  MCV 108.1*  --   PLT 255  --    Basic Metabolic Panel: Recent Labs  Lab 12/04/18 2207 12/04/18 2225  NA 144 140  K 4.2 4.2  CL 97* 100   CO2 28  --   GLUCOSE 174* 168*  BUN 72* 57*  CREATININE 9.90* 10.20*  CALCIUM 9.5  --    GFR: Estimated Creatinine Clearance: 7.2  mL/min (A) (by C-G formula based on SCr of 10.2 mg/dL (H)). Liver Function Tests: Recent Labs  Lab 12/04/18 2207  AST 39  ALT 34  ALKPHOS 121  BILITOT 0.4  PROT 8.6*  ALBUMIN 4.2   No results for input(s): LIPASE, AMYLASE in the last 168 hours. No results for input(s): AMMONIA in the last 168 hours. Coagulation Profile: Recent Labs  Lab 12/04/18 2207  INR 1.88   Cardiac Enzymes: No results for input(s): CKTOTAL, CKMB, CKMBINDEX, TROPONINI in the last 168 hours. BNP (last 3 results) Recent Labs    03/11/18 1524  PROBNP 230.0*   HbA1C: No results for input(s): HGBA1C in the last 72 hours. CBG: No results for input(s): GLUCAP in the last 168 hours. Lipid Profile: No results for input(s): CHOL, HDL, LDLCALC, TRIG, CHOLHDL, LDLDIRECT in the last 72 hours. Thyroid Function Tests: No results for input(s): TSH, T4TOTAL, FREET4, T3FREE, THYROIDAB in the last 72 hours. Anemia Panel: No results for input(s): VITAMINB12, FOLATE, FERRITIN, TIBC, IRON, RETICCTPCT in the last 72 hours. Urine analysis:    Component Value Date/Time   COLORURINE YELLOW 02/17/2018 0540   APPEARANCEUR CLOUDY (A) 02/17/2018 0540   LABSPEC 1.010 02/17/2018 0540   PHURINE 5.0 02/17/2018 0540   GLUCOSEU NEGATIVE 02/17/2018 0540   HGBUR LARGE (A) 02/17/2018 0540   BILIRUBINUR NEGATIVE 02/17/2018 0540   BILIRUBINUR negative 04/16/2013 1447   KETONESUR NEGATIVE 02/17/2018 0540   PROTEINUR 100 (A) 02/17/2018 0540   UROBILINOGEN 0.2 05/11/2014 1352   NITRITE NEGATIVE 02/17/2018 0540   LEUKOCYTESUR NEGATIVE 02/17/2018 0540   Sepsis Labs: @LABRCNTIP (procalcitonin:4,lacticidven:4) )No results found for this or any previous visit (from the past 240 hour(s)).   Radiological Exams on Admission: Dg Chest Port 1 View  Result Date: 12/04/2018 CLINICAL DATA:  Shortness of  breath EXAM: PORTABLE CHEST 1 VIEW COMPARISON:  11/01/2018, 10/21/2018 FINDINGS: Right-sided central venous catheter tip over the SVC. Cardiomegaly with vascular congestion and hazy and ground-glass opacities suspicious for pulmonary edema. More confluent opacity at the bases. No pneumothorax. IMPRESSION: 1. Cardiomegaly with vascular congestion and moderate pulmonary edema 2. Superimposed more confluent atelectasis or pneumonia at both bases. Electronically Signed   By: Donavan Foil M.D.   On: 12/04/2018 22:28    EKG: Independently reviewed.  Sinus tachycardia with IVCD with known history of LBBB.  Also has history of A. fib.  Assessment/Plan Active Problems:   Nonischemic dilated cardiomyopathy (HCC)   HIV (human immunodeficiency virus infection) ([place])   ESRD (end stage renal disease) ([place])   Pulmonary edema   Atrial fibrillation with RVR ([place])    1. Acute respiratory failure with hypoxia secondary to pulmonary edema and an ESRD patient-patient is on BiPAP.  Nephrology has been consulted for urgent dialysis.  Chest x-ray also shows features concerning for pneumonia but patient has no productive cough fever or chills and no clinical symptoms to suggest.  Will check procalcitonin levels.  Repeat chest x-ray after dialysis to confirm no pneumonia. 2. A. Fib -with presently tachycardic cardiology recommended patient to be placed on amiodarone drip.  Patient does take oral amiodarone also.  On Coumadin per pharmacy.  Once heart rate improves please wean off amiodarone drip. 3. History of nonischemic cardiomyopathy being followed by cardiologist.  Last EF measured was 15 to 20% on March 2019.  Volume management per cardiology. 4. 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 5. History of gout on allopurinol. 6. History of HIV last CD4 count in July 2019 was 380.  Continue present antiretroviral.  DVT prophylaxis: Coumadin. Code Status: Full code. Family Communication: Discussed with  patient. Disposition Plan: Home. Consults called: Nephrology. Admission status: Inpatient.   Rise Patience MD [facility] Pager [phone].  If 7PM-7AM, please contact night-coverage [email or web address] Password [facility]  12/05/2018, 12:34 AM

## 2018-12-05 NOTE — Progress Notes (Addendum)
[facility] is a 78 y.o. male with chronic CHF systolic and diastolic, paroxysmal AFib, history of PEA arrest, nonischemic dilated cardiomyopathy and ESRD called for CHF and afib by Dr. Candiss Norse.  Patient with hx of very difficult balance between hypervolemia and pulmonary edema and hypotension with interruption of dialysis. Midodrine on dialysis days.   Hemodialysis on Tuesday Thursday Saturday but last dialysis  Monday 12/30 due to holidays. However presented with dyspnea, found to have hypoxia and tachycardia. Briefly placed on amio drip during admission.  Briefly reviewed with his primary cardiologist [name]. EKG looks like sinus tachycardia. Now his rate has been improved. telemetry showed sinus rhythm at rate of 80s since admit - personally reviewed.  Volume managed by dialysis. Not on heart failure medications due to hypotension. Recommended to continue home dose of amiodarone 200mg  daily. On coumadin for anticoagulation.

## 2018-12-05 NOTE — Consult Note (Signed)
[facility] Renal Consultation Note  Requesting MD: Darl Householder Indication for Consultation: End-stage renal disease  Chief complaint: Shortness of breath  HPI:  [facility] is a 78 y.o. male with a history of end-stage renal disease on hemodialysis TTS at  [facility] via right AV fistula, chronic systolic CHF, and HIV who presented to the hospital with shortness of breath.  Patient states he has been going to treatments and last dialyzed on 12/30 per the holiday schedule for New Year's.  He states that he has noticed some wheezing and shortness of breath and abdominal fullness.  He never really gets much fluid in his legs.  He does have a history of CHF and his EF is 15%.  He was placed on BiPAP and nephrology was consulted for dialysis.  He had afib with RVR requiring amio overnight, as well.  He reports that he normally has about 2.5 kg removed at dialysis.  Sometimes he does have issues with hypotension and he is on Midodrine for the same.  He is on midodrine 10 mg twice a day on the days of dialysis.  He is seen and examined on hemodialysis and the procedure was supervised.  He asked that nursing access his AshSplit catheter today for treatment.  Tolerating treatment well - good blood flow via right IJ PermCath.  Blood pressure 134/77 heart rate 80.  PMHx:   Past Medical History:  Diagnosis Date  . Acute on chronic systolic and diastolic heart failure, NYHA class 4 ([place])   . Anemia, iron deficiency 11/15/2011  . Arthritis    "hands, right knee, feet" (02/21/2018)  . Atrial fibrillation ([place])   . Cancer [facility])    hx of prostate; s/p radioactive seed implant 10/2009 Dr Janice Norrie  . Cardiac arrest ([place]) 02/17/2018  . CHF (congestive heart failure) ([place])   . Chronic lower back pain   . CKD (chronic kidney disease) stage V requiring chronic dialysis ([place])   . ESRD on dialysis ([place])    started 02/2018  . GERD (gastroesophageal reflux disease)   . Glaucoma   . Gout    daily RX  (02/21/2018)  . Heart murmur    "mild" per pt  . Hepatitis    years ago  . History of cardiac cath 2004   negative for CAD  . History of myocardial perfusion scan 02/2010   negative for coronary insufficiency (LVEF 27%)  . HIV (human immunodeficiency virus infection) ([place])   . HIV infection ([place])   . Hyperlipidemia   . Hypertension    followed by [facility] and Vascular (Dr Dani Gobble Croitoru)  . Hypertension   . Nonischemic cardiomyopathy ([place])   . Pneumonia 11/2017  . Prostate cancer ([place])   . Sinus bradycardia   . Sleep apnea    does not use a cpap  . Stroke [name])    "mini stroke" years ago  . Systolic and diastolic CHF, acute ([place]) 06/2010   felt to be secondary to hypertensive cardiomyopathy    Past Surgical History:  Procedure Laterality Date  . AV FISTULA PLACEMENT Right 10/13/2016   Procedure: ARTERIOVENOUS (AV) FISTULA CREATION;  Surgeon: [name], MD;  Location: [place];  Service: Vascular;  Laterality: Right;  . AV FISTULA PLACEMENT Right 10/13/2016   [name] [phone]  . AV FISTULA PLACEMENT Left 02/25/2018   Procedure: INSERTION OF ARTERIOVENOUS (AV) GORE-TEX GRAFT LEFT UPPER ARM;  Surgeon: Angelia Mould, MD;  Location: [place];  Service: Vascular;  Laterality: Left;  .  AV FISTULA PLACEMENT Right 08/07/2018   Procedure: Creation of right arm brachiocephalic Fistula;  Surgeon: Waynetta Sandy, MD;  Location: [place];  Service: Vascular;  Laterality: Right;  . BASCILIC VEIN TRANSPOSITION Left 06/24/2015   Procedure: BASILIC VEIN TRANSPOSITION;  Surgeon: Mal Misty, MD;  Location: [place];  Service: Vascular;  Laterality: Left;  . BASCILIC VEIN TRANSPOSITION Left 3/66/4403 Bascilic vein transposition (Left)   [name] [phone]  . CARDIAC CATHETERIZATION  02/04/2003   mildly depressed LV systolic fx EF 87%,FIEPPI coronaries/abdominal aorta/renal arteries.  Marland Kitchen CARDIAC CATHETERIZATION  2004  . CATARACT EXTRACTION, BILATERAL Bilateral   . COLONOSCOPY    .  ESOPHAGOGASTRODUODENOSCOPY (EGD) WITH PROPOFOL N/A 05/05/2014   Procedure: ESOPHAGOGASTRODUODENOSCOPY (EGD) WITH PROPOFOL;  Surgeon: Missy Sabins, MD;  Location: [facility];  Service: Endoscopy;  Laterality: N/A;  . EYE SURGERY Bilateral    cataract surgery   . EYE SURGERY Bilateral    glaucoma surgery  . EYE SURGERY    . GLAUCOMA SURGERY Bilateral   . INSERTION OF ARTERIOVENOUS (AV) ARTEGRAFT ARM  10/09/2018   Procedure: INSERTION OF ARTERIOVENOUS (AV) 12mm x 41cm ARTEGRAFT LEFT UPPER ARM;  Surgeon: Waynetta Sandy, MD;  Location: [place];  Service: Vascular;;  . INSERTION PROSTATE RADIATION SEED    . IR FLUORO GUIDE CV LINE RIGHT  02/18/2018  . IR US GUIDE VASC ACCESS RIGHT  02/18/2018  . LEFT HEART CATH AND CORONARY ANGIOGRAPHY N/A 02/20/2018   Procedure: LEFT HEART CATH AND CORONARY ANGIOGRAPHY;  Surgeon: Jettie Booze, MD;  Location: [place] [facility];  Service: Cardiovascular;  Laterality: N/A;  . NM MYOCAR PERF WALL MOTION  02/21/2010   normal  . RADIOACTIVE SEED IMPLANT  2010   prostate cancer  . THROMBECTOMY W/ EMBOLECTOMY Left 02/26/2018   Procedure: THROMBECTOMY ARTERIOVENOUS GRAFT;  Surgeon: Angelia Mould, MD;  Location: [place];  Service: Vascular;  Laterality: Left;  . ULTRASOUND GUIDANCE FOR VASCULAR ACCESS  02/20/2018   Procedure: Ultrasound Guidance For Vascular Access;  Surgeon: Jettie Booze, MD;  Location: [place] [facility];  Service: Cardiovascular;;  . UPPER EXTREMITY VENOGRAPHY N/A 07/08/2018   Procedure: UPPER EXTREMITY VENOGRAPHY;  Surgeon: Waynetta Sandy, MD;  Location: [place] [facility];  Service: Cardiovascular;  Laterality: N/A;  Bilateral  . US ECHOCARDIOGRAPHY  02/06/2012   mild LVH,LA mod. dilated,mild-mod. 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 AI.    Family Hx:  Family History  Problem Relation Age of Onset  . Hypertension Mother   . Thyroid disease Mother   . Cholelithiasis Daughter   .  Cholelithiasis Son   . Hypertension Maternal Grandmother   . Diabetes Maternal Grandmother   . Heart attack Neg Hx   . Hyperlipidemia Neg Hx     Social History:  reports that he quit smoking about 14 years ago. His smoking use included cigarettes. He has a 30.00 pack-year smoking history. He has never used smokeless tobacco. He reports current alcohol use. He reports that he does not use drugs.  Allergies:  Allergies  Allergen Reactions  . Cozaar [Losartan Potassium] Other (See Comments)    Causes constipation     Medications: Prior to Admission medications   Medication Sig Start Date End Date Taking? Authorizing Provider  warfarin (COUMADIN) 10 MG tablet Take 10 mg by mouth daily.   Yes [provider]  acetaminophen (TYLENOL) 500 MG tablet Take 1,000 mg by mouth every 6 (six) hours as needed for  mild pain.    [provider]  allopurinol (ZYLOPRIM) 100 MG tablet Take 100 mg by mouth every evening.  06/27/18   [provider]  amiodarone (PACERONE) 200 MG tablet TAKE 1 TABLET(200 MG) BY MOUTH DAILY Patient taking differently: Take 200 mg by mouth daily.  10/29/18   Croitoru, Mihai, MD  AURYXIA 1 GM 210 MG(Fe) tablet Take 420 mg by mouth 3 (three) times daily with meals.  08/05/18   [provider]  brimonidine (ALPHAGAN) 0.15 % ophthalmic solution Place 1 drop into the left eye 2 (two) times daily. 11/15/17   [provider]  DESCOVY 200-25 MG tablet Take 1 tablet by mouth daily.  01/20/18   [provider]  Dextromethorphan-guaiFENesin (CORICIDIN HBP CONGESTION/COUGH PO) Take 1 tablet by mouth every 8 (eight) hours as needed (cold symptoms).    [provider]  dorzolamide-timolol (COSOPT) 22.3-6.8 MG/ML ophthalmic solution Place 1 drop into the left eye 2 (two) times daily. 12/17/17   [provider]  fluticasone (FLONASE) 50 MCG/ACT nasal spray Place 2 sprays into both nostrils daily. 11/13/18   [name],  NP  latanoprost (XALATAN) 0.005 % ophthalmic solution Place 1 drop into the left eye at bedtime.     [provider]  midodrine (PROAMATINE) 10 MG tablet Take 1 tablet (10 mg total) by mouth as directed. Twice a day on the day of HD Tuesday, Thursday, Saturday. 10/23/18   Lavina Hamman, MD  montelukast (SINGULAIR) 10 MG tablet Take 1 tablet (10 mg total) by mouth at bedtime. 08/12/18   [name], NP  multivitamin (RENA-VIT) TABS tablet Take 1 tablet by mouth daily.    [provider]  TIVICAY 50 MG tablet Take 50 mg by mouth daily.  01/20/18   [provider]  Vitamin D, Ergocalciferol, (DRISDOL) 50000 UNITS CAPS capsule Take 50,000 Units by mouth every Friday.  04/10/15   [provider]    I have reviewed the patient's current medications.  Labs:  BMP Latest Ref Rng & Units 12/05/2018 12/04/2018 12/04/2018  Glucose 70 - 99 mg/dL 112(H) 168(H) 174(H)  BUN 8 - 23 mg/dL 61(H) 57(H) 72(H)  Creatinine 0.61 - 1.24 mg/dL 10.22(H) 10.20(H) 9.90(H)  BUN/Creat Ratio 6 - 22 (calc) - - -  Sodium 135 - 145 mmol/L 142 140 144  Potassium 3.5 - 5.1 mmol/L 4.7 4.2 4.2  Chloride 98 - 111 mmol/L 100 100 97(L)  CO2 22 - 32 mmol/L 26 - 28  Calcium 8.9 - 10.3 mg/dL 8.9 - 9.5    ROS:  Pertinent items noted in HPI and remainder of comprehensive ROS otherwise negative.  Physical Exam: Vitals:   12/05/18 0700 12/05/18 0730  BP: 134/77 134/87  Pulse: 78 80  Resp: 19 17  Temp:    SpO2:  100%     General: Adult male in bed in no acute distress at rest HEENT: Normocephalic atraumatic Eyes: Extraocular movements intact sclera anicteric Neck: Supple trachea midline Heart: Regular rate with S1-S2 appreciated no rub appreciated Lungs: Clear to auscultation bilaterally and normal work of breathing at rest Abdomen: Softly distended/nontender normal bowel sounds Extremities: No pitting edema appreciated  Skin: No cyanosis or clubbing Neuro: Patient is alert and oriented  x3 and provides a history, conversant  Assessment/Plan:  # Acute hypoxic respiratory failure requiring BiPAP -Titration of BiPAP as able -Optimize volume status with hemodialysis now  # Acute on chronic systolic congestive heart failure -HD today  # End-stage renal disease on hemodialysis -  Hemodialysis today and per TTS schedule  # Afib with RVR  - s/p amio and rate controlled - Per primary team   # Anemia of chronic disease - Hb at goal for ESRD - ESA per outpatient unit  Claudia Desanctis 12/05/2018, 7:43 AM

## 2018-12-05 NOTE — Progress Notes (Signed)
Patient is out of unit for hemodialysis as scheduled. Left the unit at 0640

## 2018-12-06 ENCOUNTER — Encounter (HOSPITAL_COMMUNITY): Payer: Self-pay | Admitting: Radiology

## 2018-12-06 ENCOUNTER — Inpatient Hospital Stay (HOSPITAL_COMMUNITY): Payer: Medicare HMO

## 2018-12-06 HISTORY — PX: IR REMOVAL TUN CV CATH W/O FL: IMG2289

## 2018-12-06 LAB — CBC
HCT: 33.7 % — ABNORMAL LOW (ref 39.0–52.0)
Hemoglobin: 10.6 g/dL — ABNORMAL LOW (ref 13.0–17.0)
MCH: 33.5 pg (ref 26.0–34.0)
MCHC: 31.5 g/dL (ref 30.0–36.0)
MCV: 106.6 fL — ABNORMAL HIGH (ref 80.0–100.0)
Platelets: 200 10*3/uL (ref 150–400)
RBC: 3.16 MIL/uL — ABNORMAL LOW (ref 4.22–5.81)
RDW: 13.9 % (ref 11.5–15.5)
WBC: 6.1 10*3/uL (ref 4.0–10.5)
nRBC: 0 % (ref 0.0–0.2)

## 2018-12-06 LAB — RENAL FUNCTION PANEL
Albumin: 3.1 g/dL — ABNORMAL LOW (ref 3.5–5.0)
Anion gap: 10 (ref 5–15)
BUN: 33 mg/dL — AB (ref 8–23)
CO2: 27 mmol/L (ref 22–32)
Calcium: 8.8 mg/dL — ABNORMAL LOW (ref 8.9–10.3)
Chloride: 101 mmol/L (ref 98–111)
Creatinine, Ser: 6.71 mg/dL — ABNORMAL HIGH (ref 0.61–1.24)
GFR calc Af Amer: 8 mL/min — ABNORMAL LOW (ref >60)
GFR calc non Af Amer: 7 mL/min — ABNORMAL LOW (ref >60)
Glucose, Bld: 94 mg/dL (ref 70–99)
Phosphorus: 4 mg/dL (ref 2.5–4.6)
Potassium: 3.8 mmol/L (ref 3.5–5.1)
Sodium: 138 mmol/L (ref 135–145)

## 2018-12-06 LAB — MRSA PCR SCREENING: MRSA by PCR: NEGATIVE

## 2018-12-06 LAB — PROTIME-INR
INR: 1.92
PROTHROMBIN TIME: 21.7 s — AB (ref 11.4–15.2)

## 2018-12-06 LAB — HEPATITIS B SURFACE ANTIGEN: HEP B S AG: NEGATIVE

## 2018-12-06 MED ORDER — CHLORHEXIDINE GLUCONATE 4 % EX LIQD
CUTANEOUS | Status: AC
  Filled 2018-12-06: qty 15

## 2018-12-06 MED ORDER — WARFARIN SODIUM 5 MG PO TABS: 10.0000 mg | ORAL_TABLET | Freq: Once | ORAL | Status: DC

## 2018-12-06 MED ORDER — MIDODRINE HCL 5 MG PO TABS
ORAL_TABLET | ORAL | Status: AC
  Administered 2018-12-06: 10 mg via ORAL
  Filled 2018-12-06: qty 2

## 2018-12-06 MED ORDER — LIDOCAINE HCL 1 % IJ SOLN
INTRAMUSCULAR | Status: AC
  Filled 2018-12-06: qty 20

## 2018-12-06 MED ORDER — MIDODRINE HCL 5 MG PO TABS
10.0000 mg | ORAL_TABLET | Freq: Once | ORAL | Status: AC
  Administered 2018-12-06: 10 mg via ORAL

## 2018-12-06 NOTE — Procedures (Signed)
Successful removal of (R)IJ tunneled HD cath No complications.  Ascencion Dike PA-C Interventional Radiology 12/06/2018 2:52 PM

## 2018-12-06 NOTE — Progress Notes (Signed)
ANTICOAGULATION CONSULT NOTE  Pharmacy Consult:  Coumadin Indication: atrial fibrillation  Allergies  Allergen Reactions  . Cozaar [Losartan Potassium] Other (See Comments)    Causes constipation     Patient Measurements: Height: 6\' 2"  (188 cm) Weight: 177 lb 14.6 oz (80.7 kg) IBW/kg (Calculated) : 82.2  Vital Signs: Temp: 98 F (36.7 C) (01/03 1133) Temp Source: Oral (01/03 1133) BP: 105/63 (01/03 1133) Pulse Rate: 72 (01/03 1133)  Labs: Recent Labs    12/04/18 2207 12/04/18 2225 12/05/18 0308 12/05/18 1149 12/05/18 1815 12/06/18 0431 12/06/18 0739  HGB 12.9* 15.0 11.1*  --   --   --  10.6*  HCT 41.4 44.0 35.1*  --   --   --  33.7*  PLT 255  --  208  --   --   --  200  LABPROT 21.4*  --  23.6*  --   --  21.7*  --   INR 1.88  --  2.13  --   --  1.92  --   CREATININE 9.90* 10.20* 10.22*  --   --   --  6.71*  TROPONINI  --   --   --  0.35* 0.30*  --   --     Estimated Creatinine Clearance: 10.7 mL/min (A) (by C-G formula based on SCr of 6.71 mg/dL (H)).   Assessment: 26 YOM continues on Coumadin from PTA for Afib.  INR slightly sub-therapeutic.  No bleeding reported.  Goal of Therapy:  INR 2-3   Plan:  Coumadin 10mg  PO today Daily PT / INR   Florencia Zaccaro D. Mina Marble, PharmD, BCPS, [place] 12/06/2018, 1:03 PM

## 2018-12-06 NOTE — Procedures (Signed)
I was present at this dialysis session. I have reviewed the session itself and made appropriate changes.   AVG Qb 300. 2L UF tolerating well.  No dyspnea reported.  No standing weight pre HD, wil make sure to do post HD. Needs new lower EDW at DC.    [facility] ready for removal; will ask IR to remove prior to DC if able.   Filed Weights   12/05/18 0655 12/05/18 1100 12/06/18 0719  Weight: 86.6 kg 83.5 kg 83.7 kg    Recent Labs  Lab 12/06/18 0739  NA 138  K 3.8  CL 101  CO2 27  GLUCOSE 94  BUN 33*  CREATININE 6.71*  CALCIUM 8.8*  PHOS 4.0    Recent Labs  Lab 12/04/18 2207 12/04/18 2225 12/05/18 0308 12/06/18 0739  WBC 11.9*  --  8.7 6.1  NEUTROABS 5.4  --   --   --   HGB 12.9* 15.0 11.1* 10.6*  HCT 41.4 44.0 35.1* 33.7*  MCV 108.1*  --  107.0* 106.6*  PLT 255  --  208 200    Scheduled Meds: . allopurinol  100 mg Oral QPM  . amiodarone  200 mg Oral Daily  . brimonidine  1 drop Left Eye BID  . Chlorhexidine Gluconate Cloth  6 each Topical Q0600  . diltiazem  20 mg Intravenous Once  . dolutegravir  50 mg Oral Daily  . dorzolamide-timolol  1 drop Left Eye BID  . emtricitabine-tenofovir AF  1 tablet Oral Daily  . ferric citrate  420 mg Oral TID WC  . fluticasone  2 spray Each Nare Daily  . latanoprost  1 drop Left Eye QHS  . midodrine  10 mg Oral 2 times per day on Tue Thu Sat  . montelukast  10 mg Oral QHS  . multivitamin  1 tablet Oral Daily  . Warfarin - Pharmacist Dosing Inpatient   Does not apply q1800   Continuous Infusions: . sodium chloride    . sodium chloride     PRN Meds:.sodium chloride, sodium chloride, acetaminophen **OR** acetaminophen, alteplase, heparin, lidocaine (PF), lidocaine-prilocaine, ondansetron **OR** ondansetron (ZOFRAN) IV, pentafluoroprop-tetrafluoroeth   Pearson Grippe  MD 12/06/2018, 8:52 AM

## 2018-12-06 NOTE — Progress Notes (Signed)
Pt given discharge instructions and care notes. Pt verbalized understanding AEB no further questions or concerns at this time. IV was discontinued, no redness, pain, or swelling noted at this time. Telemetry discontinued and Centralized Telemetry was notified. Pt left the floor via wheelchair with staff in stable condition.

## 2018-12-06 NOTE — Discharge Summary (Addendum)
DISCHARGE SUMMARY  David [name]  MR#: [id number]  DOB:November 07, 1941  Date of Admission: 12/04/2018 Date of Discharge: 12/06/2018  Attending Physician:Bonner Larue Hennie Duos, MD  Patient's PCP:O'Sullivan, Lenna Sciara, NP  Consults: [facility] Nephrology   Disposition: D/C home  Follow-up Appts: Follow-up Information    Tommy Medal, Lavell Islam, MD .   Specialty:  Infectious Diseases Contact information: [address] [address] [phone]        [name], MD .   Specialty:  Cardiology Contact information: [address] [place] [address] [phone]           Discharge Diagnoses: Acute hypoxic respiratory failure Pulmonary edema Acute on chronic systolic heart failure ESRD Nonischemic cardiomyopathy Chronic A. Fib Anemia of chronic kidney disease HIV+ Gout  Initial presentation: 78 y.o.malew/ a hx of A. fib, nonischemic cardiomyopathy (EF 15-20% March 2019), ESRD on hemodialysis on Tuesday Thursday Saturday. His HD schedule was changed for holiday and he last received dialysis 2 days prior to this admit. He developed increasing shortness of breath but denied dietary indiscretion or medication noncompliance.  In the ER he was found to be in pulmonary edema and acute hypoxic respiratory failure. There was also a question of brief afib, though later Cardiology review suggested this was sinus tachycardia.   Hospital Course:  Acute hypoxic respiratory failure due to pulmonary edema due to acute on chronic systolic heart failure EF 20% in ESRD patient.  Initially required BiPAP - appears exacerbation was due to need for lower EDW target, as well as holiday HD schedule delaying his tx - Nephrology to adjust his EDW - doing well post HD at time of d/c - HD cath removed from R chest prior to d/c home   Acute on chronic systolic CHF due to nonischemic cardiomyopathy Baseline EF 15-20% in March 2019 - fluid management  through HD  Chronic A. Fib on oral amiodarone along with Coumadin as outpt - briefly placed on amiodarone drip by Cardiology fellow in the ER - Cardiology review suggested his initial RVR was actually sinus tachycardia - no changes to be made in his usual tx regimen   Anemia of chronic disease Hgb stable   HIV+ Last CD4 count in July 2019 was 380 - continue present regimen - post discharge follow-up with ID outpatient  Gout Continue home dose allopurinol.  Allergies as of 12/06/2018      Reactions   Cozaar [losartan Potassium] Other (See Comments)   Causes constipation      Medication List    TAKE these medications   acetaminophen 500 MG tablet Commonly known as:  TYLENOL Take 1,000 mg by mouth every 6 (six) hours as needed for mild pain.   allopurinol 100 MG tablet Commonly known as:  ZYLOPRIM Take 100 mg by mouth every evening.   amiodarone 200 MG tablet Commonly known as:  PACERONE TAKE 1 TABLET(200 MG) BY MOUTH DAILY What changed:  See the new instructions.   AURYXIA 1 GM 210 MG(Fe) tablet Generic drug:  ferric citrate Take 420 mg by mouth 3 (three) times daily with meals.   brimonidine 0.15 % ophthalmic solution Commonly known as:  ALPHAGAN Place 1 drop into the left eye 2 (two) times daily.   CORICIDIN HBP CONGESTION/COUGH PO Take 1 tablet by mouth every 8 (eight) hours as needed (cold symptoms).   DESCOVY 200-25 MG tablet Generic drug:  emtricitabine-tenofovir AF Take 1 tablet by mouth daily.   dorzolamide-timolol 22.3-6.8 MG/ML ophthalmic solution Commonly known  as:  COSOPT Place 1 drop into the left eye 2 (two) times daily.   fluticasone 50 MCG/ACT nasal spray Commonly known as:  FLONASE Place 2 sprays into both nostrils daily.   latanoprost 0.005 % ophthalmic solution Commonly known as:  XALATAN Place 1 drop into the left eye at bedtime.   midodrine 10 MG tablet Commonly known as:  PROAMATINE Take 1 tablet (10 mg total) by mouth as  directed. Twice a day on the day of HD Tuesday, Thursday, Saturday.   montelukast 10 MG tablet Commonly known as:  SINGULAIR Take 1 tablet (10 mg total) by mouth at bedtime.   multivitamin Tabs tablet Take 1 tablet by mouth daily.   TIVICAY 50 MG tablet Generic drug:  dolutegravir Take 50 mg by mouth daily.   Vitamin D (Ergocalciferol) 1.25 MG (50000 UT) Caps capsule Commonly known as:  DRISDOL Take 50,000 Units by mouth every Friday.   warfarin 10 MG tablet Commonly known as:  COUMADIN Take as directed. If you are unsure how to take this medication, talk to your nurse or doctor. Original instructions:  Take 10 mg by mouth daily.       Day of Discharge BP 105/63 (BP Location: Right Arm)   Pulse 72   Temp 98 F (36.7 C) (Oral)   Resp 18   Ht 6\' 2"  (1.88 m)   Wt 80.7 kg   SpO2 100%   BMI 22.84 kg/m   Physical Exam: General: No acute respiratory distress Lungs: Clear to auscultation bilaterally without wheezes or crackles Cardiovascular: Regular rate and rhythm without murmur gallop or rub normal S1 and S2 Abdomen: Nontender, nondistended, soft, bowel sounds positive, no rebound, no ascites, no appreciable mass Extremities: No significant cyanosis, clubbing, or edema bilateral lower extremities  Basic Metabolic Panel: Recent Labs  Lab 12/04/18 2207 12/04/18 2225 12/05/18 0308 12/06/18 0739  NA 144 140 142 138  K 4.2 4.2 4.7 3.8  CL 97* 100 100 101  CO2 28  --  26 27  GLUCOSE 174* 168* 112* 94  BUN 72* 57* 61* 33*  CREATININE 9.90* 10.20* 10.22* 6.71*  CALCIUM 9.5  --  8.9 8.8*  PHOS  --   --   --  4.0    Liver Function Tests: Recent Labs  Lab 12/04/18 2207 12/06/18 0739  AST 39  --   ALT 34  --   ALKPHOS 121  --   BILITOT 0.4  --   PROT 8.6*  --   ALBUMIN 4.2 3.1*    Coags: Recent Labs  Lab 12/04/18 2207 12/05/18 0308 12/06/18 0431  INR 1.88 2.13 1.92    CBC: Recent Labs  Lab 12/04/18 2207 12/04/18 2225 12/05/18 0308 12/06/18 0739   WBC 11.9*  --  8.7 6.1  NEUTROABS 5.4  --   --   --   HGB 12.9* 15.0 11.1* 10.6*  HCT 41.4 44.0 35.1* 33.7*  MCV 108.1*  --  107.0* 106.6*  PLT 255  --  208 200    Cardiac Enzymes: Recent Labs  Lab 12/05/18 1149 12/05/18 1815  TROPONINI 0.35* 0.30*   BNP (last 3 results) Recent Labs    02/17/18 0300 11/01/18 0512 12/04/18 2207  BNP 800.9* 556.2* 1,630.5*    ProBNP (last 3 results) Recent Labs    03/11/18 1524  PROBNP 230.0*    Recent Results (from the past 240 hour(s))  MRSA PCR Screening     Status: None   Collection Time: 12/05/18  9:43 PM  Result Value Ref Range Status   MRSA by PCR NEGATIVE NEGATIVE Final    Comment:        The GeneXpert MRSA Assay (FDA approved for NASAL specimens only), is one component of a comprehensive MRSA colonization surveillance program. It is not intended to diagnose MRSA infection nor to guide or monitor treatment for MRSA infections. Performed at [facility], [place] [address] [place], [place] [address]      Time spent in discharge (includes decision making & examination of pt): 30 minutes  12/06/2018, 2:57 PM   Cherene Altes, MD [facility] Office  [phone] Pager [phone]  On-Call/Text Page:      [email or web address]      password [facility]

## 2018-12-07 DIAGNOSIS — E8770 Fluid overload, unspecified: Secondary | ICD-10-CM | POA: Diagnosis not present

## 2018-12-07 DIAGNOSIS — N2581 Secondary hyperparathyroidism of renal origin: Secondary | ICD-10-CM | POA: Diagnosis not present

## 2018-12-07 DIAGNOSIS — N186 End stage renal disease: Secondary | ICD-10-CM | POA: Diagnosis not present

## 2018-12-09 ENCOUNTER — Other Ambulatory Visit: Payer: Self-pay | Admitting: Cardiovascular Disease

## 2018-12-10 DIAGNOSIS — N186 End stage renal disease: Secondary | ICD-10-CM | POA: Diagnosis not present

## 2018-12-10 DIAGNOSIS — N2581 Secondary hyperparathyroidism of renal origin: Secondary | ICD-10-CM | POA: Diagnosis not present

## 2018-12-10 DIAGNOSIS — E8770 Fluid overload, unspecified: Secondary | ICD-10-CM | POA: Diagnosis not present

## 2018-12-12 DIAGNOSIS — E8770 Fluid overload, unspecified: Secondary | ICD-10-CM | POA: Diagnosis not present

## 2018-12-12 DIAGNOSIS — N186 End stage renal disease: Secondary | ICD-10-CM | POA: Diagnosis not present

## 2018-12-12 DIAGNOSIS — N2581 Secondary hyperparathyroidism of renal origin: Secondary | ICD-10-CM | POA: Diagnosis not present

## 2018-12-14 DIAGNOSIS — N186 End stage renal disease: Secondary | ICD-10-CM | POA: Diagnosis not present

## 2018-12-14 DIAGNOSIS — N2581 Secondary hyperparathyroidism of renal origin: Secondary | ICD-10-CM | POA: Diagnosis not present

## 2018-12-14 DIAGNOSIS — E8770 Fluid overload, unspecified: Secondary | ICD-10-CM | POA: Diagnosis not present

## 2018-12-15 ENCOUNTER — Other Ambulatory Visit: Payer: Self-pay | Admitting: Infectious Disease

## 2018-12-17 DIAGNOSIS — N2581 Secondary hyperparathyroidism of renal origin: Secondary | ICD-10-CM | POA: Diagnosis not present

## 2018-12-17 DIAGNOSIS — E8770 Fluid overload, unspecified: Secondary | ICD-10-CM | POA: Diagnosis not present

## 2018-12-17 DIAGNOSIS — N186 End stage renal disease: Secondary | ICD-10-CM | POA: Diagnosis not present

## 2018-12-18 ENCOUNTER — Other Ambulatory Visit: Payer: Self-pay

## 2018-12-18 ENCOUNTER — Other Ambulatory Visit: Payer: Medicare HMO

## 2018-12-18 DIAGNOSIS — B20 Human immunodeficiency virus [HIV] disease: Secondary | ICD-10-CM

## 2018-12-18 DIAGNOSIS — I10 Essential (primary) hypertension: Secondary | ICD-10-CM | POA: Diagnosis not present

## 2018-12-18 DIAGNOSIS — Z79899 Other long term (current) drug therapy: Secondary | ICD-10-CM | POA: Diagnosis not present

## 2018-12-18 MED ORDER — EMTRICITABINE-TENOFOVIR AF 200-25 MG PO TABS: 1.0000 | ORAL_TABLET | Freq: Every day | ORAL | 0 refills | Status: DC

## 2018-12-18 MED ORDER — DOLUTEGRAVIR SODIUM 50 MG PO TABS: ORAL_TABLET | ORAL | 0 refills | Status: DC

## 2018-12-19 DIAGNOSIS — N2581 Secondary hyperparathyroidism of renal origin: Secondary | ICD-10-CM | POA: Diagnosis not present

## 2018-12-19 DIAGNOSIS — E8770 Fluid overload, unspecified: Secondary | ICD-10-CM | POA: Diagnosis not present

## 2018-12-19 DIAGNOSIS — N186 End stage renal disease: Secondary | ICD-10-CM | POA: Diagnosis not present

## 2018-12-19 LAB — T-HELPER CELL (CD4) - (RCID CLINIC ONLY)
CD4 % Helper T Cell: 24 % — ABNORMAL LOW (ref 33–55)
CD4 T Cell Abs: 410 /uL (ref 400–2700)

## 2018-12-20 ENCOUNTER — Telehealth: Payer: Self-pay | Admitting: *Deleted

## 2018-12-20 LAB — RPR TITER: RPR Titer: 1:2 {titer} — ABNORMAL HIGH

## 2018-12-20 LAB — COMPREHENSIVE METABOLIC PANEL
AG Ratio: 1.5 (calc) (ref 1.0–2.5)
ALKALINE PHOSPHATASE (APISO): 106 U/L (ref 40–115)
ALT: 26 U/L (ref 9–46)
AST: 22 U/L (ref 10–35)
Albumin: 4.5 g/dL (ref 3.6–5.1)
BUN/Creatinine Ratio: 6 (calc) (ref 6–22)
BUN: 49 mg/dL — ABNORMAL HIGH (ref 7–25)
CO2: 32 mmol/L (ref 20–32)
Calcium: 9.4 mg/dL (ref 8.6–10.3)
Chloride: 95 mmol/L — ABNORMAL LOW (ref 98–110)
Creat: 8.41 mg/dL — ABNORMAL HIGH (ref 0.70–1.18)
Globulin: 3.1 g/dL (calc) (ref 1.9–3.7)
Glucose, Bld: 125 mg/dL — ABNORMAL HIGH (ref 65–99)
Potassium: 4.3 mmol/L (ref 3.5–5.3)
Sodium: 142 mmol/L (ref 135–146)
Total Bilirubin: 0.5 mg/dL (ref 0.2–1.2)
Total Protein: 7.6 g/dL (ref 6.1–8.1)

## 2018-12-20 LAB — LIPID PANEL
Cholesterol: 221 mg/dL — ABNORMAL HIGH (ref <200)
HDL: 45 mg/dL (ref >40)
LDL Cholesterol (Calc): 145 mg/dL (calc) — ABNORMAL HIGH
NON-HDL CHOLESTEROL (CALC): 176 mg/dL — AB (ref <130)
Total CHOL/HDL Ratio: 4.9 (calc) (ref <5.0)
Triglycerides: 170 mg/dL — ABNORMAL HIGH (ref <150)

## 2018-12-20 LAB — CBC
HCT: 34.2 % — ABNORMAL LOW (ref 38.5–50.0)
Hemoglobin: 11.7 g/dL — ABNORMAL LOW (ref 13.2–17.1)
MCH: 34.9 pg — ABNORMAL HIGH (ref 27.0–33.0)
MCHC: 34.2 g/dL (ref 32.0–36.0)
MCV: 102.1 fL — ABNORMAL HIGH (ref 80.0–100.0)
MPV: 9.6 fL (ref 7.5–12.5)
Platelets: 289 10*3/uL (ref 140–400)
RBC: 3.35 10*6/uL — ABNORMAL LOW (ref 4.20–5.80)
RDW: 13.8 % (ref 11.0–15.0)
WBC: 6 10*3/uL (ref 3.8–10.8)

## 2018-12-20 LAB — HIV-1 RNA QUANT-NO REFLEX-BLD
HIV 1 RNA Quant: <20 copies/mL
HIV-1 RNA Quant, Log: <1.3 Log copies/mL

## 2018-12-20 LAB — RPR: RPR: REACTIVE — AB

## 2018-12-20 LAB — FLUORESCENT TREPONEMAL AB(FTA)-IGG-BLD: Fluorescent Treponemal ABS: REACTIVE — AB

## 2018-12-20 NOTE — Telephone Encounter (Signed)
He is end stage renal disease on dialysis so to be expected. Thanks!

## 2018-12-20 NOTE — Telephone Encounter (Signed)
[facility] called with an alert Creat of 8.41 done 12/18/18.

## 2018-12-21 DIAGNOSIS — N186 End stage renal disease: Secondary | ICD-10-CM | POA: Diagnosis not present

## 2018-12-21 DIAGNOSIS — N2581 Secondary hyperparathyroidism of renal origin: Secondary | ICD-10-CM | POA: Diagnosis not present

## 2018-12-21 DIAGNOSIS — E8770 Fluid overload, unspecified: Secondary | ICD-10-CM | POA: Diagnosis not present

## 2018-12-23 ENCOUNTER — Other Ambulatory Visit: Payer: Self-pay | Admitting: Cardiovascular Disease

## 2018-12-24 DIAGNOSIS — N2581 Secondary hyperparathyroidism of renal origin: Secondary | ICD-10-CM | POA: Diagnosis not present

## 2018-12-24 DIAGNOSIS — N186 End stage renal disease: Secondary | ICD-10-CM | POA: Diagnosis not present

## 2018-12-24 DIAGNOSIS — E8770 Fluid overload, unspecified: Secondary | ICD-10-CM | POA: Diagnosis not present

## 2018-12-26 DIAGNOSIS — N2581 Secondary hyperparathyroidism of renal origin: Secondary | ICD-10-CM | POA: Diagnosis not present

## 2018-12-26 DIAGNOSIS — N186 End stage renal disease: Secondary | ICD-10-CM | POA: Diagnosis not present

## 2018-12-26 DIAGNOSIS — E8770 Fluid overload, unspecified: Secondary | ICD-10-CM | POA: Diagnosis not present

## 2018-12-27 ENCOUNTER — Encounter: Payer: Self-pay | Admitting: Physician Assistant

## 2018-12-27 ENCOUNTER — Ambulatory Visit: Payer: Medicare HMO | Admitting: Physician Assistant

## 2018-12-27 ENCOUNTER — Encounter: Payer: Self-pay | Admitting: Nephrology

## 2018-12-27 ENCOUNTER — Ambulatory Visit (INDEPENDENT_AMBULATORY_CARE_PROVIDER_SITE_OTHER): Payer: Medicare HMO | Admitting: Pharmacist Clinician (PhC)/ Clinical Pharmacy Specialist

## 2018-12-27 VITALS — BP 90/56 | HR 80 | Ht 74.0 in | Wt 187.0 lb

## 2018-12-27 DIAGNOSIS — N186 End stage renal disease: Secondary | ICD-10-CM | POA: Diagnosis not present

## 2018-12-27 DIAGNOSIS — Z5181 Encounter for therapeutic drug level monitoring: Secondary | ICD-10-CM

## 2018-12-27 DIAGNOSIS — I428 Other cardiomyopathies: Secondary | ICD-10-CM | POA: Diagnosis not present

## 2018-12-27 DIAGNOSIS — Z992 Dependence on renal dialysis: Secondary | ICD-10-CM | POA: Diagnosis not present

## 2018-12-27 DIAGNOSIS — I2510 Atherosclerotic heart disease of native coronary artery without angina pectoris: Secondary | ICD-10-CM | POA: Diagnosis not present

## 2018-12-27 DIAGNOSIS — I480 Paroxysmal atrial fibrillation: Secondary | ICD-10-CM

## 2018-12-27 DIAGNOSIS — I482 Chronic atrial fibrillation, unspecified: Secondary | ICD-10-CM

## 2018-12-27 DIAGNOSIS — I5042 Chronic combined systolic (congestive) and diastolic (congestive) heart failure: Secondary | ICD-10-CM

## 2018-12-27 DIAGNOSIS — I4891 Unspecified atrial fibrillation: Secondary | ICD-10-CM | POA: Diagnosis not present

## 2018-12-27 DIAGNOSIS — B20 Human immunodeficiency virus [HIV] disease: Secondary | ICD-10-CM

## 2018-12-27 LAB — POCT INR: INR: 2.2 (ref 2.0–3.0)

## 2018-12-27 NOTE — Progress Notes (Signed)
Cardiology Office Note    Date:  12/29/2018   ID:  [facility], [place] 1941-07-27, MRN [id number]  PCP:  [name], NP  Cardiologist:  [name]  Chief Complaint  Patient presents with  . Follow-up    seen for [name]    History of Present Illness:  David [name] is a 77 y.o. male with PMH of chronic systolic and diastolic heart failure, minimal CAD, PAF, h/o PEA arrest, NICM, HIV, PAF and ESRD on HD TThS. He had a normal coronary angiography in 2004.  He also suffered a PEA arrest on 03/12/8118 complicated by development of new atrial fibrillation and progression to end-stage renal disease on hemodialysis.  EF was 15 to 20%.  Coronary angiography after cardiac arrest showed only minor atherosclerosis without significant disease.  He also has atypical left bundle branch block.  He underwent surgical implantation of left upper arm AV graft in November 2019.  2 previous attempts at placing the AV fistula access point has failed.  He is currently getting dialysis via a tunneled right subclavian catheter.  He has dialysis after he interrupted due to hypotension therefore he has been difficult time managing his volume.  He was previously admitted in November for acute pulmonary edema with hypoxic respiratory failure.  He was last seen by [name] on 11/11/2018, at which time, he was maintaining sinus rhythm on amiodarone.  His carvedilol was discontinued due to hypotension.  He was placed on Midrin on dialysis days.  Patient was most recently admitted in early January 2020 for recurrent acute hypoxic respiratory failure due to pulmonary edema.  He initially required BiPAP therapy.  Symptoms improved after hemodialysis.  He did have tachycardia, however on further review by cardiology service, it was felt to be sinus tachycardia instead.  Therefore no further change was made to his usual regimen.  He presents today for cardiology office visit.  He denies any significant  shortness of breath or chest discomfort.  He actually has been doing quite well since his recent discharge.  His blood pressure is borderline low today.  He says his kidney doctor has increased his midodrine to 1 tablets daily on nondialysis days and twice daily on dialysis days.  He may drink a little bit more fluid today to help his blood pressure coming up.  He is aware that he should not drink more than 46 ounces of fluid per day given his LV dysfunction and frequent readmission for heart failure.  His weight is 3 pounds less than December today.  He says he went for dialysis yesterday and was able to stay for 4 hours during dialysis.  His blood pressure was in the 80s during dialysis, however quickly bounced up to 110 after dialysis.   Past Medical History:  Diagnosis Date  . Acute on chronic systolic and diastolic heart failure, NYHA class 4 ([place])   . Anemia, iron deficiency 11/15/2011  . Arthritis    "hands, right knee, feet" (02/21/2018)  . Atrial fibrillation ([place])   . Cancer [facility])    hx of prostate; s/p radioactive seed implant 10/2009 Dr Janice Norrie  . Cardiac arrest ([place]) 02/17/2018  . CHF (congestive heart failure) ([place])   . Chronic lower back pain   . CKD (chronic kidney disease) stage V requiring chronic dialysis ([place])   . ESRD on dialysis ([place])    started 02/2018  . GERD (gastroesophageal reflux disease)   . Glaucoma   . Gout    daily RX (  02/21/2018)  . Heart murmur    "mild" per pt  . Hepatitis    years ago  . History of cardiac cath 2004   negative for CAD  . History of myocardial perfusion scan 02/2010   negative for coronary insufficiency (LVEF 27%)  . HIV (human immunodeficiency virus infection) ([place])   . HIV infection ([place])   . Hyperlipidemia   . Hypertension    followed by [facility] (Dr Dani Gobble Croitoru)  . Hypertension   . Nonischemic cardiomyopathy ([place])   . Pneumonia 11/2017  . Prostate cancer ([place])   . Sinus bradycardia   . Sleep apnea     does not use a cpap  . Stroke [facility])    "mini stroke" years ago  . Systolic and diastolic CHF, acute ([place]) 06/2010   felt to be secondary to hypertensive cardiomyopathy    Past Surgical History:  Procedure Laterality Date  . AV FISTULA PLACEMENT Right 10/13/2016   Procedure: ARTERIOVENOUS (AV) FISTULA CREATION;  Surgeon: [name], MD;  Location: [place];  Service: Vascular;  Laterality: Right;  . AV FISTULA PLACEMENT Right 10/13/2016   [name] [phone]  . AV FISTULA PLACEMENT Left 02/25/2018   Procedure: INSERTION OF ARTERIOVENOUS (AV) GORE-TEX GRAFT LEFT UPPER ARM;  Surgeon: Angelia Mould, MD;  Location: [place];  Service: Vascular;  Laterality: Left;  . AV FISTULA PLACEMENT Right 08/07/2018   Procedure: Creation of right arm brachiocephalic Fistula;  Surgeon: Waynetta Sandy, MD;  Location: [place];  Service: Vascular;  Laterality: Right;  . BASCILIC VEIN TRANSPOSITION Left 06/24/2015   Procedure: BASILIC VEIN TRANSPOSITION;  Surgeon: Mal Misty, MD;  Location: [place];  Service: Vascular;  Laterality: Left;  . BASCILIC VEIN TRANSPOSITION Left 2/40/9735 Bascilic vein transposition (Left)   [name] [phone]  . CARDIAC CATHETERIZATION  02/04/2003   mildly depressed LV systolic fx EF 34%,HDQQIW coronaries/abdominal aorta/renal arteries.  Marland Kitchen CARDIAC CATHETERIZATION  2004  . CATARACT EXTRACTION, BILATERAL Bilateral   . COLONOSCOPY    . ESOPHAGOGASTRODUODENOSCOPY (EGD) WITH PROPOFOL N/A 05/05/2014   Procedure: ESOPHAGOGASTRODUODENOSCOPY (EGD) WITH PROPOFOL;  Surgeon: Missy Sabins, MD;  Location: [facility];  Service: Endoscopy;  Laterality: N/A;  . EYE SURGERY Bilateral    cataract surgery   . EYE SURGERY Bilateral    glaucoma surgery  . EYE SURGERY    . GLAUCOMA SURGERY Bilateral   . INSERTION OF ARTERIOVENOUS (AV) ARTEGRAFT ARM  10/09/2018   Procedure: INSERTION OF ARTERIOVENOUS (AV) 40mm x 41cm ARTEGRAFT LEFT UPPER ARM;  Surgeon: Waynetta Sandy, MD;  Location: [place];   Service: Vascular;;  . INSERTION PROSTATE RADIATION SEED    . IR FLUORO GUIDE CV LINE RIGHT  02/18/2018  . IR REMOVAL TUN CV CATH W/O FL  12/06/2018  . IR US GUIDE VASC ACCESS RIGHT  02/18/2018  . LEFT HEART CATH AND CORONARY ANGIOGRAPHY N/A 02/20/2018   Procedure: LEFT HEART CATH AND CORONARY ANGIOGRAPHY;  Surgeon: Jettie Booze, MD;  Location: [place] [facility];  Service: Cardiovascular;  Laterality: N/A;  . NM MYOCAR PERF WALL MOTION  02/21/2010   normal  . RADIOACTIVE SEED IMPLANT  2010   prostate cancer  . THROMBECTOMY W/ EMBOLECTOMY Left 02/26/2018   Procedure: THROMBECTOMY ARTERIOVENOUS GRAFT;  Surgeon: Angelia Mould, MD;  Location: [place];  Service: Vascular;  Laterality: Left;  . ULTRASOUND GUIDANCE FOR VASCULAR ACCESS  02/20/2018   Procedure: Ultrasound Guidance For Vascular Access;  Surgeon: Jettie Booze, MD;  Location: [place] [facility];  Service: Cardiovascular;;  . UPPER EXTREMITY VENOGRAPHY N/A 07/08/2018   Procedure: UPPER EXTREMITY VENOGRAPHY;  Surgeon: Waynetta Sandy, MD;  Location: [place] [facility];  Service: Cardiovascular;  Laterality: N/A;  Bilateral  . US ECHOCARDIOGRAPHY  02/06/2012   mild LVH,LA mod. dilated,mild-mod. 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 AI.    Current Medications: Outpatient Medications Prior to Visit  Medication Sig Dispense Refill  . acetaminophen (TYLENOL) 500 MG tablet Take 1,000 mg by mouth every 6 (six) hours as needed for mild pain.    Marland Kitchen allopurinol (ZYLOPRIM) 100 MG tablet TAKE 1 TABLET EVERY DAY 90 tablet 1  . amiodarone (PACERONE) 200 MG tablet TAKE 1 TABLET(200 MG) BY MOUTH DAILY (Patient taking differently: Take 200 mg by mouth daily. ) 90 tablet 1  . AURYXIA 1 GM 210 MG(Fe) tablet Take 420 mg by mouth 3 (three) times daily with meals.     . brimonidine (ALPHAGAN) 0.15 % ophthalmic solution Place 1 drop into the left eye 2 (two) times daily.  3  . Dextromethorphan-guaiFENesin  (CORICIDIN HBP CONGESTION/COUGH PO) Take 1 tablet by mouth every 8 (eight) hours as needed (cold symptoms).    . dolutegravir (TIVICAY) 50 MG tablet TAKE 1 TABLET(50 MG) BY MOUTH DAILY 30 tablet 0  . dorzolamide-timolol (COSOPT) 22.3-6.8 MG/ML ophthalmic solution Place 1 drop into the left eye 2 (two) times daily.    Marland Kitchen emtricitabine-tenofovir AF (DESCOVY) 200-25 MG tablet Take 1 tablet by mouth daily. 30 tablet 0  . fluticasone (FLONASE) 50 MCG/ACT nasal spray Place 2 sprays into both nostrils daily. 16 g 6  . latanoprost (XALATAN) 0.005 % ophthalmic solution Place 1 drop into the left eye at bedtime.     . midodrine (PROAMATINE) 10 MG tablet Take 1 tablet (10 mg total) by mouth as directed. Twice a day on the day of HD Tuesday, Thursday, Saturday. 30 tablet 0  . montelukast (SINGULAIR) 10 MG tablet Take 1 tablet (10 mg total) by mouth at bedtime. 30 tablet 5  . multivitamin (RENA-VIT) TABS tablet Take 1 tablet by mouth daily.    . Vitamin D, Ergocalciferol, (DRISDOL) 50000 UNITS CAPS capsule Take 50,000 Units by mouth every Friday.   2  . warfarin (COUMADIN) 10 MG tablet Take 10 mg by mouth daily.    Marland Kitchen warfarin (COUMADIN) 5 MG tablet TAKE 1 TO 1 AND 1/2 TABLETS BY MOUTH DAILY AS DIRECTED BY COUMADIN CLINIC 45 tablet 0   No facility-administered medications prior to visit.      Allergies:   Cozaar [losartan potassium]   Social History   Socioeconomic History  . Marital status: Married    Spouse name: Not on file  . Number of children: 2  . Years of education: 46  . Highest education level: Not on file  Occupational History  . Occupation: retired, picks up Aeronautical engineer: RETIRED  Social Needs  . Financial resource strain: Not on file  . Food insecurity:    Worry: Not on file    Inability: Not on file  . Transportation needs:    Medical: Not on file    Non-medical: Not on file  Tobacco Use  . Smoking status: Former Smoker    Packs/day: 1.00    Years: 30.00    Pack  years: 30.00    Types: Cigarettes    Last attempt to quit: 2006    Years since quitting: 14.0  . Smokeless  tobacco: Never Used  Substance and Sexual Activity  . Alcohol use: Yes    Comment: once a week-wine  . Drug use: Never  . Sexual activity: Not Currently  Lifestyle  . Physical activity:    Days per week: Not on file    Minutes per session: Not on file  . Stress: Not on file  Relationships  . Social connections:    Talks on phone: Not on file    Gets together: Not on file    Attends religious service: Not on file    Active member of club or organization: Not on file    Attends meetings of clubs or organizations: Not on file    Relationship status: Not on file  Other Topics Concern  . Not on file  Social History Narrative   ** Merged History Encounter **       Stays active at home Regular exercise: no Drinks 2 cups of coffee a week, 1 mountain dew soda a day.     Family History:  The patient's family history includes Cholelithiasis in his daughter and son; Diabetes in his maternal grandmother; Hypertension in his maternal grandmother and mother; Thyroid disease in his mother.   ROS:   Please see the history of present illness.    ROS All other systems reviewed and are negative.   PHYSICAL EXAM:   VS:  BP (!) 90/56   Pulse 80   Ht 6\' 2"  (1.88 m)   Wt 187 lb (84.8 kg)   SpO2 98%   BMI 24.01 kg/m    GEN: Well nourished, well developed, in no acute distress  HEENT: normal  Neck: no JVD, carotid bruits, or masses Cardiac: RRR; no murmurs, rubs, or gallops,no edema  Respiratory:  clear to auscultation bilaterally, normal work of breathing GI: soft, nontender, nondistended, + BS MS: no deformity or atrophy  Skin: warm and dry, no rash Neuro:  Alert and Oriented x 3, Strength and sensation are intact Psych: euthymic mood, full affect  Wt Readings from Last 3 Encounters:  12/27/18 187 lb (84.8 kg)  12/06/18 177 lb 14.6 oz (80.7 kg)  11/13/18 190 lb (86.2 kg)       Studies/Labs Reviewed:   EKG:  EKG is ordered today.  The ekg ordered today demonstrates normal sinus rhythm, poor R wave progression anterior leads, left bundle branch block.  Recent Labs: 03/03/2018: TSH 1.576 03/09/2018: Magnesium 2.3 03/11/2018: Pro B Natriuretic peptide (BNP) 230.0 12/04/2018: B Natriuretic Peptide 1,630.5 12/18/2018: ALT 26; BUN 49; Creat 8.41; Hemoglobin 11.7; Platelets 289; Potassium 4.3; Sodium 142   Lipid Panel    Component Value Date/Time   CHOL 221 (H) 12/18/2018 0930   CHOL 197 12/31/2017 0945   TRIG 170 (H) 12/18/2018 0930   HDL 45 12/18/2018 0930   HDL 47 12/31/2017 0945   CHOLHDL 4.9 12/18/2018 0930   VLDL 12 05/22/2017 0926   LDLCALC 145 (H) 12/18/2018 0930    Additional studies/ records that were reviewed today include:   Echo 02/18/2018 LV EF: 15% -   20% Study Conclusions  - Left ventricle: The cavity size was moderately dilated. There was   moderate concentric hypertrophy. Systolic function was normal.   The estimated ejection fraction was in the range of 15% to 20%.   Doppler parameters are consistent with abnormal left ventricular   relaxation (grade 1 diastolic dysfunction). - Ventricular septum: Septal motion showed paradox. - Aortic valve: There was mild regurgitation. - Left atrium: The atrium was mildly  dilated.  Impressions:  - No prior study available for comparison. LV is moderately dilated   with severely decreased LVEF estimated at 15-20%. There is   diffuse hypokinesis with akinesis at the inferior and   inferolateral walls. RV is poorly visualized but appears to have   normal size and systolic function.   Cath 02/20/2018  Ost 2nd Mrg to 2nd Mrg lesion is 50% stenosed.  Mid LAD lesion is 25% stenosed.  LV end diastolic pressure is normal. LVEDP 6 mm Hg.  There is no aortic valve stenosis.   Nonobstructive CAD.  Continue medical therapy.    ASSESSMENT:    1. Chronic combined systolic and diastolic heart  failure ([place])   2. Coronary artery disease involving native coronary artery of native heart without angina pectoris   3. NICM (nonischemic cardiomyopathy) ([place])   4. PAF (paroxysmal atrial fibrillation) ([place])   5. HIV infection, unspecified symptom status ([place])   6. ESRD (end stage renal disease) on dialysis [facility])      PLAN:  In order of problems listed above:  1. Chronic combined systolic and diastolic heart failure: Volume managed by nephrology service.  He is essentially off of all heart failure medication due to low blood pressure.  He takes Midrin on a daily basis and twice on dialysis days  2. Nonischemic cardiomyopathy: Overall poor prognosis given the duration and inability to tolerate any heart failure medication  3. PAF: Maintaining sinus rhythm.  Continue amiodarone and Coumadin  4. History of HIV infection: Managed by infectious disease  5. CAD: Minimal amount of CAD seen last year on cardiac catheterization  6. End-stage renal disease on dialysis: Managed by nephrology.  Require Midrin twice a day on dialysis days and once a day on nondialysis days.    Medication Adjustments/Labs and Tests Ordered: Current medicines are reviewed at length with the patient today.  Concerns regarding medicines are outlined above.  Medication changes, Labs and Tests ordered today are listed in the Patient Instructions below. Patient Instructions  Medication Instructions:  TAKE MIDODRINE Twice a day on the day of HD Tuesday, Thursday, Saturday. THEN DAILY ON NON-DIALYSIS DAYS If you need a refill on your cardiac medications before your next appointment, please call your pharmacy.  Labwork: NONE ORDERED  When you have your labs (blood work) drawn today and your tests are completely normal, you will receive your results only by MyChart Message (if you have MyChart) -OR-  A paper copy in the mail.  If you have any lab test that is abnormal or we need to change your treatment, we will call you  to review these results.  Special Instructions: YOU MAY INCREASE YOU FLUID INTAKE TODAY ONLY, BUT DO NOT DRINK TOO  MUCH  Follow-Up: You will need a follow up appointment in 2-3 months.   You may see [name], MD or one of the following Advanced Practice Providers on your designated Care Team:   Almyra Deforest, [place]  Fabian Sharp, PA-C   At [facility], you and your health needs are our priority.  As part of our continuing mission to provide you with exceptional heart care, we have created designated Provider Care Teams.  These Care Teams include your primary Cardiologist (physician) and Advanced Practice Providers (APPs -  Physician Assistants and Nurse Practitioners) who all work together to provide you with the care you need, when you need it.  Thank you for choosing [facility] at [facility],  Almyra Deforest, [place]  12/29/2018 11:26 PM    [facility] [place], [place], [place]  [address] Phone: [phone]; Fax: [phone]

## 2018-12-27 NOTE — Patient Instructions (Signed)
Medication Instructions:  TAKE MIDODRINE Twice a day on the day of HD Tuesday, Thursday, Saturday. THEN DAILY ON NON-DIALYSIS DAYS If you need a refill on your cardiac medications before your next appointment, please call your pharmacy.  Labwork: NONE ORDERED  When you have your labs (blood work) drawn today and your tests are completely normal, you will receive your results only by MyChart Message (if you have MyChart) -OR-  A paper copy in the mail.  If you have any lab test that is abnormal or we need to change your treatment, we will call you to review these results.  Special Instructions: YOU MAY INCREASE YOU FLUID INTAKE TODAY ONLY, BUT DO NOT DRINK TOO  MUCH  Follow-Up: You will need a follow up appointment in 2-3 months.   You may see [name], MD or one of the following Advanced Practice Providers on your designated Care Team:   Almyra Deforest, [place]  Fabian Sharp, PA-C   At [facility], you and your health needs are our priority.  As part of our continuing mission to provide you with exceptional heart care, we have created designated Provider Care Teams.  These Care Teams include your primary Cardiologist (physician) and Advanced Practice Providers (APPs -  Physician Assistants and Nurse Practitioners) who all work together to provide you with the care you need, when you need it.  Thank you for choosing [facility] at [facility]!!

## 2018-12-28 DIAGNOSIS — N186 End stage renal disease: Secondary | ICD-10-CM | POA: Diagnosis not present

## 2018-12-28 DIAGNOSIS — E8770 Fluid overload, unspecified: Secondary | ICD-10-CM | POA: Diagnosis not present

## 2018-12-28 DIAGNOSIS — N2581 Secondary hyperparathyroidism of renal origin: Secondary | ICD-10-CM | POA: Diagnosis not present

## 2018-12-29 ENCOUNTER — Encounter: Payer: Self-pay | Admitting: Physician Assistant

## 2018-12-30 DIAGNOSIS — E8770 Fluid overload, unspecified: Secondary | ICD-10-CM | POA: Diagnosis not present

## 2018-12-30 DIAGNOSIS — N2581 Secondary hyperparathyroidism of renal origin: Secondary | ICD-10-CM | POA: Diagnosis not present

## 2018-12-30 DIAGNOSIS — N186 End stage renal disease: Secondary | ICD-10-CM | POA: Diagnosis not present

## 2018-12-30 NOTE — Progress Notes (Signed)
Thanks, Isaac Laud. Not much else we can do MCr

## 2018-12-31 DIAGNOSIS — E8770 Fluid overload, unspecified: Secondary | ICD-10-CM | POA: Diagnosis not present

## 2018-12-31 DIAGNOSIS — N186 End stage renal disease: Secondary | ICD-10-CM | POA: Diagnosis not present

## 2018-12-31 DIAGNOSIS — N2581 Secondary hyperparathyroidism of renal origin: Secondary | ICD-10-CM | POA: Diagnosis not present

## 2019-01-01 ENCOUNTER — Encounter: Payer: Self-pay | Admitting: Infectious Disease

## 2019-01-01 ENCOUNTER — Ambulatory Visit: Payer: Medicare HMO | Admitting: Infectious Disease

## 2019-01-01 ENCOUNTER — Other Ambulatory Visit: Payer: Self-pay

## 2019-01-01 VITALS — BP 107/66 | HR 76 | Ht 74.0 in | Wt 187.0 lb

## 2019-01-01 DIAGNOSIS — N186 End stage renal disease: Secondary | ICD-10-CM | POA: Diagnosis not present

## 2019-01-01 DIAGNOSIS — A529 Late syphilis, unspecified: Secondary | ICD-10-CM | POA: Diagnosis not present

## 2019-01-01 DIAGNOSIS — Z992 Dependence on renal dialysis: Secondary | ICD-10-CM | POA: Diagnosis not present

## 2019-01-01 DIAGNOSIS — Z21 Asymptomatic human immunodeficiency virus [HIV] infection status: Secondary | ICD-10-CM

## 2019-01-01 DIAGNOSIS — B20 Human immunodeficiency virus [HIV] disease: Secondary | ICD-10-CM | POA: Diagnosis not present

## 2019-01-01 MED ORDER — BICTEGRAVIR-EMTRICITAB-TENOFOV 50-200-25 MG PO TABS: 1.0000 | ORAL_TABLET | Freq: Every day | ORAL | 11 refills | Status: DC

## 2019-01-01 NOTE — Progress Notes (Signed)
Chief complaint: followup for HIV disease, CKD,   Subjective:    Patient ID: David [name], male    DOB: Feb 07, 1941, 78 y.o.   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  Due to his comorbid cardiomyopathy and chronic kidney disease we had some concern about ABACAVIR and gentle risk for cardiovascular disease and therefore we changed him to [facility]. He has maintained perfect control on [name] but is complaining of nausea and also lightheadedness. There could because some confounding here given the fact that he was also on terazosin  BPH but he still is having nausea and lightheadedness despite having this medication held. He wanted to be changed back off of the [place] and we changed  him to [facility] and [place].  Virological control.  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  He does state that on hemodialysis days he feels very drained and fatigued after they remove fluid from him.  We discussed changing him to single tablet regimen in the form of [name] (I had forgotten switch made before) but in any case he was willing to change for dosing simplicity.    Past Medical History:  Diagnosis Date  . Acute on chronic systolic and diastolic heart failure, NYHA class 4 ([place])   . Anemia, iron deficiency 11/15/2011  . Arthritis    "hands, right knee, feet" (02/21/2018)  . Atrial fibrillation ([place])   . Cancer [name])    hx of prostate; s/p radioactive seed implant 10/2009 Dr Janice Norrie  . Cardiac arrest ([place]) 02/17/2018  . CHF (congestive heart failure) ([place])   . Chronic lower back pain   . CKD (chronic kidney disease) stage V requiring chronic dialysis ([place])   . ESRD on dialysis ([place])    started 02/2018  . GERD  (gastroesophageal reflux disease)   . Glaucoma   . Gout    daily RX (02/21/2018)  . Heart murmur    "mild" per pt  . Hepatitis    years ago  . History of cardiac cath 2004   negative for CAD  . History of myocardial perfusion scan 02/2010   negative for coronary insufficiency (LVEF 27%)  . HIV (human immunodeficiency virus infection) ([place])   . HIV infection ([place])   . Hyperlipidemia   . Hypertension    followed by [facility] Vascular (Dr Dani Gobble [place])  . Hypertension   . Nonischemic cardiomyopathy ([place])   . Pneumonia 11/2017  . Prostate cancer ([place])   . Sinus bradycardia   . Sleep apnea    does not use a cpap  . Stroke [facility])    "mini stroke" years ago  . Systolic and diastolic CHF, acute ([place]) 06/2010   felt to be secondary to hypertensive cardiomyopathy    Past Surgical History:  Procedure Laterality Date  . AV FISTULA PLACEMENT Right 10/13/2016   Procedure: ARTERIOVENOUS (AV) FISTULA CREATION;  Surgeon: [name], MD;  Location: [place];  Service: Vascular;  Laterality: Right;  . AV FISTULA PLACEMENT Right 10/13/2016   [name] [id number]  . AV FISTULA PLACEMENT Left 02/25/2018   Procedure: INSERTION OF ARTERIOVENOUS (AV) GORE-TEX GRAFT LEFT UPPER ARM;  Surgeon: Angelia Mould, MD;  Location: [place];  Service: Vascular;  Laterality: Left;  . AV FISTULA PLACEMENT Right 08/07/2018   Procedure: Creation of right arm brachiocephalic Fistula;  Surgeon: Waynetta Sandy, MD;  Location: [place];  Service: Vascular;  Laterality: Right;  . BASCILIC VEIN TRANSPOSITION Left 06/24/2015   Procedure: BASILIC VEIN TRANSPOSITION;  Surgeon: Mal Misty, MD;  Location: [place];  Service: Vascular;  Laterality: Left;  . BASCILIC VEIN TRANSPOSITION Left 01/05/5426 Bascilic vein transposition (Left)   [name] [id number]  . CARDIAC CATHETERIZATION  02/04/2003   mildly depressed LV systolic fx EF 15%,VVOHYW coronaries/abdominal aorta/renal arteries.  Marland Kitchen CARDIAC CATHETERIZATION  2004   . CATARACT EXTRACTION, BILATERAL Bilateral   . COLONOSCOPY    . ESOPHAGOGASTRODUODENOSCOPY (EGD) WITH PROPOFOL N/A 05/05/2014   Procedure: ESOPHAGOGASTRODUODENOSCOPY (EGD) WITH PROPOFOL;  Surgeon: Missy Sabins, MD;  Location: [facility];  Service: Endoscopy;  Laterality: N/A;  . EYE SURGERY Bilateral    cataract surgery   . EYE SURGERY Bilateral    glaucoma surgery  . EYE SURGERY    . GLAUCOMA SURGERY Bilateral   . INSERTION OF ARTERIOVENOUS (AV) ARTEGRAFT ARM  10/09/2018   Procedure: INSERTION OF ARTERIOVENOUS (AV) 1mm x 41cm ARTEGRAFT LEFT UPPER ARM;  Surgeon: Waynetta Sandy, MD;  Location: [place];  Service: Vascular;;  . INSERTION PROSTATE RADIATION SEED    . IR FLUORO GUIDE CV LINE RIGHT  02/18/2018  . IR REMOVAL TUN CV CATH W/O FL  12/06/2018  . IR US GUIDE VASC ACCESS RIGHT  02/18/2018  . LEFT HEART CATH AND CORONARY ANGIOGRAPHY N/A 02/20/2018   Procedure: LEFT HEART CATH AND CORONARY ANGIOGRAPHY;  Surgeon: Jettie Booze, MD;  Location: [place] CV [facility];  Service: Cardiovascular;  Laterality: N/A;  . NM MYOCAR PERF WALL MOTION  02/21/2010   normal  . RADIOACTIVE SEED IMPLANT  2010   prostate cancer  . THROMBECTOMY W/ EMBOLECTOMY Left 02/26/2018   Procedure: THROMBECTOMY ARTERIOVENOUS GRAFT;  Surgeon: Angelia Mould, MD;  Location: [place];  Service: Vascular;  Laterality: Left;  . ULTRASOUND GUIDANCE FOR VASCULAR ACCESS  02/20/2018   Procedure: Ultrasound Guidance For Vascular Access;  Surgeon: Jettie Booze, MD;  Location: [place] [facility];  Service: Cardiovascular;;  . UPPER EXTREMITY VENOGRAPHY N/A 07/08/2018   Procedure: UPPER EXTREMITY VENOGRAPHY;  Surgeon: Waynetta Sandy, MD;  Location: [place] [facility];  Service: Cardiovascular;  Laterality: N/A;  Bilateral  . US ECHOCARDIOGRAPHY  02/06/2012   mild LVH,LA mod. dilated,mild-mod. 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 AI.    Family History  Problem Relation Age  of Onset  . Hypertension Mother   . Thyroid disease Mother   . Cholelithiasis Daughter   . Cholelithiasis Son   . Hypertension Maternal Grandmother   . Diabetes Maternal Grandmother   . Heart attack Neg Hx   . Hyperlipidemia Neg Hx       Social History   Socioeconomic History  . Marital status: Married    Spouse name: Not on file  . Number of children: 2  . Years of education: 10  . Highest education level: Not on file  Occupational History  . Occupation: retired, picks up Aeronautical engineer: RETIRED  Social Needs  . Financial resource strain: Not on file  . Food insecurity:    Worry: Not on file    Inability: Not on file  . Transportation needs:    Medical: Not on file    Non-medical: Not on file  Tobacco Use  .  Smoking status: Former Smoker    Packs/day: 1.00    Years: 30.00    Pack years: 30.00    Types: Cigarettes    Last attempt to quit: 2006    Years since quitting: 14.0  . Smokeless tobacco: Never Used  Substance and Sexual Activity  . Alcohol use: Yes    Comment: once a week-wine  . Drug use: Never  . Sexual activity: Not Currently  Lifestyle  . Physical activity:    Days per week: Not on file    Minutes per session: Not on file  . Stress: Not on file  Relationships  . Social connections:    Talks on phone: Not on file    Gets together: Not on file    Attends religious service: Not on file    Active member of club or organization: Not on file    Attends meetings of clubs or organizations: Not on file    Relationship status: Not on file  Other Topics Concern  . Not on file  Social History Narrative   ** Merged History Encounter **       Stays active at home Regular exercise: no Drinks 2 cups of coffee a week, 1 mountain dew soda a day.    Allergies  Allergen Reactions  . Cozaar [Losartan Potassium] Other (See Comments)    Causes constipation      Current Outpatient Medications:  .  acetaminophen (TYLENOL) 500 MG tablet, Take  1,000 mg by mouth every 6 (six) hours as needed for mild pain., Disp: , Rfl:  .  allopurinol (ZYLOPRIM) 100 MG tablet, TAKE 1 TABLET EVERY DAY, Disp: 90 tablet, Rfl: 1 .  amiodarone (PACERONE) 200 MG tablet, TAKE 1 TABLET(200 MG) BY MOUTH DAILY (Patient taking differently: Take 200 mg by mouth daily. ), Disp: 90 tablet, Rfl: 1 .  AURYXIA 1 GM 210 MG(Fe) tablet, Take 420 mg by mouth 3 (three) times daily with meals. , Disp: , Rfl:  .  bictegravir-emtricitabine-tenofovir AF ([name]) 50-200-25 MG TABS tablet, Take 1 tablet by mouth daily., Disp: 30 tablet, Rfl: 11 .  brimonidine (ALPHAGAN) 0.15 % ophthalmic solution, Place 1 drop into the left eye 2 (two) times daily., Disp: , Rfl: 3 .  Dextromethorphan-guaiFENesin (CORICIDIN HBP CONGESTION/COUGH PO), Take 1 tablet by mouth every 8 (eight) hours as needed (cold symptoms)., Disp: , Rfl:  .  dorzolamide-timolol (COSOPT) 22.3-6.8 MG/ML ophthalmic solution, Place 1 drop into the left eye 2 (two) times daily., Disp: , Rfl:  .  fluticasone (FLONASE) 50 MCG/ACT nasal spray, Place 2 sprays into both nostrils daily., Disp: 16 g, Rfl: 6 .  latanoprost (XALATAN) 0.005 % ophthalmic solution, Place 1 drop into the left eye at bedtime. , Disp: , Rfl:  .  midodrine (PROAMATINE) 10 MG tablet, Take 1 tablet (10 mg total) by mouth as directed. Twice a day on the day of HD Tuesday, Thursday, Saturday., Disp: 30 tablet, Rfl: 0 .  montelukast (SINGULAIR) 10 MG tablet, Take 1 tablet (10 mg total) by mouth at bedtime., Disp: 30 tablet, Rfl: 5 .  multivitamin (RENA-VIT) TABS tablet, Take 1 tablet by mouth daily., Disp: , Rfl:  .  Vitamin D, Ergocalciferol, (DRISDOL) 50000 UNITS CAPS capsule, Take 50,000 Units by mouth every Friday. , Disp: , Rfl: 2 .  warfarin (COUMADIN) 10 MG tablet, Take 10 mg by mouth daily., Disp: , Rfl:  .  warfarin (COUMADIN) 5 MG tablet, TAKE 1 TO 1 AND 1/2 TABLETS BY MOUTH DAILY AS DIRECTED  BY COUMADIN CLINIC, Disp: 45 tablet, Rfl: 0   Review of  Systems  Constitutional: Negative for activity change, appetite change, chills, fatigue, fever and unexpected weight change.  HENT: Negative for congestion, rhinorrhea, sinus pressure, sneezing, sore throat and trouble swallowing.   Respiratory: Negative for cough and shortness of breath.   Cardiovascular: Negative for chest pain, palpitations and leg swelling.  Gastrointestinal: Negative for abdominal distention, abdominal pain, constipation, nausea and vomiting.  Genitourinary: Negative for hematuria.  Musculoskeletal: Negative for myalgias.  Skin: Negative for color change, pallor, rash and wound.  Neurological: Negative for dizziness, tremors, weakness and light-headedness.  Hematological: Negative for adenopathy. Does not bruise/bleed easily.  Psychiatric/Behavioral: Negative for agitation, behavioral problems, confusion, decreased concentration and sleep disturbance.       Objective:   Physical Exam  Constitutional: He is oriented to person, place, and time. He appears well-developed and well-nourished. No distress.  HENT:  Head: Normocephalic and atraumatic.  Mouth/Throat: Oropharynx is clear and moist. No oropharyngeal exudate.  Eyes: Conjunctivae and EOM are normal. No scleral icterus.  Neck: Normal range of motion. Neck supple.  Cardiovascular: Normal rate, regular rhythm and normal heart sounds. Exam reveals no gallop and no friction rub.  No murmur heard. Pulmonary/Chest: Effort normal and breath sounds normal. No respiratory distress.  Abdominal: Soft. Bowel sounds are normal. He exhibits no distension. There is no abdominal tenderness. There is no rebound.  Musculoskeletal:        General: No tenderness or edema.  Neurological: He is alert and oriented to person, place, and time. He has normal reflexes. He exhibits normal muscle tone. Coordination normal.  Skin: Skin is warm and dry. He is not diaphoretic. No erythema. No pallor.  Psychiatric: He has a normal mood and  affect. His behavior is normal. Judgment and thought content normal.          Assessment & Plan:   HIV/AIDS: Due to his SPAP in January.  I have sent a prescription for [name] which he can switch over to once he is finished this month's supply of TIVICAY and DESCOVY  If he has symptoms like he had before we could I will switch him back but I suspect the symptoms have nothing to do with the [place]   Syphilis:titers down  CKD: followed by Dr Posey Pronto now on hemodialysis  HTN: followup with PCP, better  controlled   Vitals:   01/01/19 1014  BP: 107/66  Pulse: 76     NICM: Cardiology following closely    I spent greater than 25 minutes with the patient including greater than 50% of time in face to face counsel of the patient reviewing all of his laboratory data praising him on his high adherence to his antiretrovirals, reviewing his medications discussion _U=U data, and in coordination of his care.

## 2019-01-02 DIAGNOSIS — E8770 Fluid overload, unspecified: Secondary | ICD-10-CM | POA: Diagnosis not present

## 2019-01-02 DIAGNOSIS — N2581 Secondary hyperparathyroidism of renal origin: Secondary | ICD-10-CM | POA: Diagnosis not present

## 2019-01-02 DIAGNOSIS — N186 End stage renal disease: Secondary | ICD-10-CM | POA: Diagnosis not present

## 2019-01-03 DIAGNOSIS — N186 End stage renal disease: Secondary | ICD-10-CM | POA: Diagnosis not present

## 2019-01-03 DIAGNOSIS — D631 Anemia in chronic kidney disease: Secondary | ICD-10-CM | POA: Diagnosis not present

## 2019-01-03 DIAGNOSIS — Z992 Dependence on renal dialysis: Secondary | ICD-10-CM | POA: Diagnosis not present

## 2019-01-04 DIAGNOSIS — N186 End stage renal disease: Secondary | ICD-10-CM | POA: Diagnosis not present

## 2019-01-04 DIAGNOSIS — N2581 Secondary hyperparathyroidism of renal origin: Secondary | ICD-10-CM | POA: Diagnosis not present

## 2019-01-07 DIAGNOSIS — N2581 Secondary hyperparathyroidism of renal origin: Secondary | ICD-10-CM | POA: Diagnosis not present

## 2019-01-07 DIAGNOSIS — N186 End stage renal disease: Secondary | ICD-10-CM | POA: Diagnosis not present

## 2019-01-08 ENCOUNTER — Encounter: Payer: Self-pay | Admitting: Infectious Disease

## 2019-01-09 DIAGNOSIS — N186 End stage renal disease: Secondary | ICD-10-CM | POA: Diagnosis not present

## 2019-01-09 DIAGNOSIS — N2581 Secondary hyperparathyroidism of renal origin: Secondary | ICD-10-CM | POA: Diagnosis not present

## 2019-01-10 LAB — POCT INR: INR: 2 (ref 2.0–3.0)

## 2019-01-11 DIAGNOSIS — N186 End stage renal disease: Secondary | ICD-10-CM | POA: Diagnosis not present

## 2019-01-11 DIAGNOSIS — N2581 Secondary hyperparathyroidism of renal origin: Secondary | ICD-10-CM | POA: Diagnosis not present

## 2019-01-13 ENCOUNTER — Telehealth: Payer: Self-pay

## 2019-01-13 NOTE — Telephone Encounter (Signed)
Called to let pt know that they are overdue coumadin pt states that they prefer to have it drawn at dialysis will call [facility]

## 2019-01-14 ENCOUNTER — Ambulatory Visit (INDEPENDENT_AMBULATORY_CARE_PROVIDER_SITE_OTHER): Payer: Medicare HMO | Admitting: Cardiology

## 2019-01-14 DIAGNOSIS — Z5181 Encounter for therapeutic drug level monitoring: Secondary | ICD-10-CM | POA: Diagnosis not present

## 2019-01-14 DIAGNOSIS — N2581 Secondary hyperparathyroidism of renal origin: Secondary | ICD-10-CM | POA: Diagnosis not present

## 2019-01-14 DIAGNOSIS — N186 End stage renal disease: Secondary | ICD-10-CM | POA: Diagnosis not present

## 2019-01-14 DIAGNOSIS — I482 Chronic atrial fibrillation, unspecified: Secondary | ICD-10-CM

## 2019-01-16 DIAGNOSIS — N186 End stage renal disease: Secondary | ICD-10-CM | POA: Diagnosis not present

## 2019-01-16 DIAGNOSIS — N2581 Secondary hyperparathyroidism of renal origin: Secondary | ICD-10-CM | POA: Diagnosis not present

## 2019-01-17 ENCOUNTER — Ambulatory Visit: Payer: Medicare HMO | Admitting: *Deleted

## 2019-01-17 ENCOUNTER — Ambulatory Visit (INDEPENDENT_AMBULATORY_CARE_PROVIDER_SITE_OTHER): Payer: Medicare HMO | Admitting: *Deleted

## 2019-01-17 ENCOUNTER — Encounter: Payer: Self-pay | Admitting: *Deleted

## 2019-01-17 VITALS — BP 120/78 | HR 74 | Ht 74.0 in | Wt 191.2 lb

## 2019-01-17 DIAGNOSIS — Z0000 Encounter for general adult medical examination without abnormal findings: Secondary | ICD-10-CM | POA: Diagnosis not present

## 2019-01-17 LAB — PROTIME-INR: INR: 1.8 — AB (ref 0.9–1.1)

## 2019-01-17 NOTE — Patient Instructions (Signed)
Please schedule your next medicare wellness visit with me in 1 yr.  Continue to eat heart healthy diet (full of fruits, vegetables, whole grains, lean protein, water--limit salt, fat, and sugar intake) and increase physical activity as tolerated.  Continue doing brain stimulating activities (puzzles, reading, adult coloring books, staying active) to keep memory sharp.   [name] , Thank you for taking time to come for your Medicare Wellness Visit. I appreciate your ongoing commitment to your health goals. Please review the following plan we discussed and let me know if I can assist you in the future.   These are the goals we discussed: Goals    . go to [place]       This is a list of the screening recommended for you and due dates:  Health Maintenance  Topic Date Due  . Tetanus Vaccine  12/26/2020  . Flu Shot  Completed  . Pneumonia vaccines  Completed    Health Maintenance After Age 77 After age 77, you are at a higher risk for certain long-term diseases and infections as well as injuries from falls. Falls are a major cause of broken bones and head injuries in people who are older than age 77. Getting regular preventive care can help to keep you healthy and well. Preventive care includes getting regular testing and making lifestyle changes as recommended by your health care provider. Talk with your health care provider about:  Which screenings and tests you should have. A screening is a test that checks for a disease when you have no symptoms.  A diet and exercise plan that is right for you. What should I know about screenings and tests to prevent falls? Screening and testing are the best ways to find a health problem early. Early diagnosis and treatment give you the best chance of managing medical conditions that are common after age 77. Certain conditions and lifestyle choices may make you more likely to have a fall. Your health care provider may recommend:  Regular vision  checks. Poor vision and conditions such as cataracts can make you more likely to have a fall. If you wear glasses, make sure to get your prescription updated if your vision changes.  Medicine review. Work with your health care provider to regularly review all of the medicines you are taking, including over-the-counter medicines. Ask your health care provider about any side effects that may make you more likely to have a fall. Tell your health care provider if any medicines that you take make you feel dizzy or sleepy.  Osteoporosis screening. Osteoporosis is a condition that causes the bones to get weaker. This can make the bones weak and cause them to break more easily.  Blood pressure screening. Blood pressure changes and medicines to control blood pressure can make you feel dizzy.  Strength and balance checks. Your health care provider may recommend certain tests to check your strength and balance while standing, walking, or changing positions.  Foot health exam. Foot pain and numbness, as well as not wearing proper footwear, can make you more likely to have a fall.  Depression screening. You may be more likely to have a fall if you have a fear of falling, feel emotionally low, or feel unable to do activities that you used to do.  Alcohol use screening. Using too much alcohol can affect your balance and may make you more likely to have a fall. What actions can I take to lower my risk of falls? General instructions  Talk  with your health care provider about your risks for falling. Tell your health care provider if: ? You fall. Be sure to tell your health care provider about all falls, even ones that seem minor. ? You feel dizzy, sleepy, or off-balance.  Take over-the-counter and prescription medicines only as told by your health care provider. These include any supplements.  Eat a healthy diet and maintain a healthy weight. A healthy diet includes low-fat dairy products, low-fat (lean)  meats, and fiber from whole grains, beans, and lots of fruits and vegetables. Home safety  Remove any tripping hazards, such as rugs, cords, and clutter.  Install safety equipment such as grab bars in bathrooms and safety rails on stairs.  Keep rooms and walkways well-lit. Activity   Follow a regular exercise program to stay fit. This will help you maintain your balance. Ask your health care provider what types of exercise are appropriate for you.  If you need a cane or walker, use it as recommended by your health care provider.  Wear supportive shoes that have nonskid soles. Lifestyle  Do not drink alcohol if your health care provider tells you not to drink.  If you drink alcohol, limit how much you have: ? 0-1 drink a day for women. ? 0-2 drinks a day for men.  Be aware of how much alcohol is in your drink. In the [place] one drink equals one typical bottle of beer (12 oz), one-half glass of wine (5 oz), or one shot of hard liquor (1 oz).  Do not use any products that contain nicotine or tobacco, such as cigarettes and e-cigarettes. If you need help quitting, ask your health care provider. Summary  Having a healthy lifestyle and getting preventive care can help to protect your health and wellness after age 31.  Screening and testing are the best way to find a health problem early and help you avoid having a fall. Early diagnosis and treatment give you the best chance for managing medical conditions that are more common for people who are older than age 77.  Falls are a major cause of broken bones and head injuries in people who are older than age 77. Take precautions to prevent a fall at home.  Work with your health care provider to learn what changes you can make to improve your health and wellness and to prevent falls. This information is not intended to replace advice given to you by your health care provider. Make sure you discuss any questions you have with your health care  provider. Document Released: 10/03/2017 Document Revised: 10/03/2017 Document Reviewed: 10/03/2017 Elsevier Interactive Patient Education  2019 [employer].

## 2019-01-17 NOTE — Progress Notes (Signed)
Subjective:   David [name] is a 78 y.o. male who presents for an Initial Medicare Annual Wellness Visit.  HD on T-TH-SAT  The Patient was informed that the wellness visit is to identify future health risk and educate and initiate measures that can reduce risk for increased disease through the lifespan.   Describes health as fair, good or great? FEELS PRETTY GOOD  Review of Systems  No ROS.  Medicare Wellness Visit. Additional risk factors are reflected in the social history.  Cardiac Risk Factors include: advanced age (>63men, >84 women);male gender Sleep patterns: no issues Home Safety/Smoke Alarms: Feels safe in home. Smoke alarms in place.  3 steps going into 1 story home. Handrails in place. Lives with wife and dog. Walk in shower.  Eye- every 3 months with DR.Groat. next appt next month  Male:   CCS- declines  PSA-  Lab Results  Component Value Date   PSA <0.01 (L) 05/13/2014   PSA 0.01 08/16/2011      Objective:    Today's Vitals   01/17/19 1006  BP: 120/78  Pulse: 74  SpO2: 98%  Weight: 191 lb 3.2 oz (86.7 kg)  Height: 6\' 2"  (1.88 m)   Body mass index is 24.55 kg/m.  Advanced Directives 01/17/2019 12/04/2018 11/01/2018 10/22/2018 10/21/2018 10/09/2018 09/13/2018  Does Patient Have a Medical Advance Directive? Yes Yes No Yes No No No  Type of Paramedic of [place];Living will Healthcare Power of [place]  Does patient want to make changes to medical advance directive? No - Patient declined - No - Patient declined No - Patient declined - - -  Copy of [place] in Chart? No - copy requested - - No - copy requested - - -  Would patient like information on creating a medical advance directive? - - No - Patient declined - No - Patient declined No - Patient declined -  Pre-existing out of facility DNR order (yellow form or pink MOST form) - - - - - - -    Current Medications  (verified) Outpatient Encounter Medications as of 01/17/2019  Medication Sig  . allopurinol (ZYLOPRIM) 100 MG tablet TAKE 1 TABLET EVERY DAY  . amiodarone (PACERONE) 200 MG tablet TAKE 1 TABLET(200 MG) BY MOUTH DAILY (Patient taking differently: Take 200 mg by mouth daily. )  . AURYXIA 1 GM 210 MG(Fe) tablet Take 420 mg by mouth 3 (three) times daily with meals.   . bictegravir-emtricitabine-tenofovir AF ([name]) 50-200-25 MG TABS tablet Take 1 tablet by mouth daily.  . brimonidine (ALPHAGAN) 0.15 % ophthalmic solution Place 1 drop into the left eye 2 (two) times daily.  . dorzolamide-timolol (COSOPT) 22.3-6.8 MG/ML ophthalmic solution Place 1 drop into the left eye 2 (two) times daily.  . fluticasone (FLONASE) 50 MCG/ACT nasal spray Place 2 sprays into both nostrils daily.  Marland Kitchen latanoprost (XALATAN) 0.005 % ophthalmic solution Place 1 drop into the left eye at bedtime.   . midodrine (PROAMATINE) 10 MG tablet Take 1 tablet (10 mg total) by mouth as directed. Twice a day on the day of HD Tuesday, Thursday, Saturday.  . montelukast (SINGULAIR) 10 MG tablet Take 1 tablet (10 mg total) by mouth at bedtime.  . multivitamin (RENA-VIT) TABS tablet Take 1 tablet by mouth daily.  . Vitamin D, Ergocalciferol, (DRISDOL) 50000 UNITS CAPS capsule Take 50,000 Units by mouth every Friday.   . warfarin (COUMADIN) 10 MG tablet Take 10  mg by mouth daily.  Marland Kitchen acetaminophen (TYLENOL) 500 MG tablet Take 1,000 mg by mouth every 6 (six) hours as needed for mild pain.  Marland Kitchen warfarin (COUMADIN) 5 MG tablet TAKE 1 TO 1 AND 1/2 TABLETS BY MOUTH DAILY AS DIRECTED BY COUMADIN CLINIC (Patient not taking: Reported on 01/17/2019)  . [DISCONTINUED] Dextromethorphan-guaiFENesin (CORICIDIN HBP CONGESTION/COUGH PO) Take 1 tablet by mouth every 8 (eight) hours as needed (cold symptoms).   No facility-administered encounter medications on file as of 01/17/2019.     Allergies (verified) Cozaar [losartan potassium]   History: Past  Medical History:  Diagnosis Date  . Acute on chronic systolic and diastolic heart failure, NYHA class 4 ([place])   . Anemia, iron deficiency 11/15/2011  . Arthritis    "hands, right knee, feet" (02/21/2018)  . Atrial fibrillation ([place])   . Cancer [name])    hx of prostate; s/p radioactive seed implant 10/2009 Dr Janice Norrie  . Cardiac arrest ([place]) 02/17/2018  . CHF (congestive heart failure) ([place])   . Chronic lower back pain   . CKD (chronic kidney disease) stage V requiring chronic dialysis ([place])   . ESRD on dialysis ([place])    started 02/2018  . GERD (gastroesophageal reflux disease)   . Glaucoma   . Gout    daily RX (02/21/2018)  . Heart murmur    "mild" per pt  . Hepatitis    years ago  . History of cardiac cath 2004   negative for CAD  . History of myocardial perfusion scan 02/2010   negative for coronary insufficiency (LVEF 27%)  . HIV (human immunodeficiency virus infection) ([place])   . HIV infection ([place])   . Hyperlipidemia   . Hypertension    followed by [facility] and Vascular (Dr Dani Gobble Croitoru)  . Hypertension   . Nonischemic cardiomyopathy ([place])   . Pneumonia 11/2017  . Prostate cancer ([place])   . Sinus bradycardia   . Sleep apnea    does not use a cpap  . Stroke [facility])    "mini stroke" years ago  . Systolic and diastolic CHF, acute ([place]) 06/2010   felt to be secondary to hypertensive cardiomyopathy   Past Surgical History:  Procedure Laterality Date  . AV FISTULA PLACEMENT Right 10/13/2016   Procedure: ARTERIOVENOUS (AV) FISTULA CREATION;  Surgeon: [name], MD;  Location: [place];  Service: Vascular;  Laterality: Right;  . AV FISTULA PLACEMENT Right 10/13/2016   [name] [phone]  . AV FISTULA PLACEMENT Left 02/25/2018   Procedure: INSERTION OF ARTERIOVENOUS (AV) GORE-TEX GRAFT LEFT UPPER ARM;  Surgeon: Angelia Mould, MD;  Location: [place];  Service: Vascular;  Laterality: Left;  . AV FISTULA PLACEMENT Right 08/07/2018   Procedure: Creation of right arm  brachiocephalic Fistula;  Surgeon: Waynetta Sandy, MD;  Location: [place];  Service: Vascular;  Laterality: Right;  . BASCILIC VEIN TRANSPOSITION Left 06/24/2015   Procedure: BASILIC VEIN TRANSPOSITION;  Surgeon: Mal Misty, MD;  Location: [place];  Service: Vascular;  Laterality: Left;  . BASCILIC VEIN TRANSPOSITION Left 4/85/4627 Bascilic vein transposition (Left)   [name] [phone]  . CARDIAC CATHETERIZATION  02/04/2003   mildly depressed LV systolic fx EF 82%,[id number] coronaries/abdominal aorta/renal arteries.  Marland Kitchen CARDIAC CATHETERIZATION  2004  . CATARACT EXTRACTION, BILATERAL Bilateral   . COLONOSCOPY    . ESOPHAGOGASTRODUODENOSCOPY (EGD) WITH PROPOFOL N/A 05/05/2014   Procedure: ESOPHAGOGASTRODUODENOSCOPY (EGD) WITH PROPOFOL;  Surgeon: Missy Sabins, MD;  Location: [facility];  Service: Endoscopy;  Laterality: N/A;  .  EYE SURGERY Bilateral    cataract surgery   . EYE SURGERY Bilateral    glaucoma surgery  . EYE SURGERY    . GLAUCOMA SURGERY Bilateral   . INSERTION OF ARTERIOVENOUS (AV) ARTEGRAFT ARM  10/09/2018   Procedure: INSERTION OF ARTERIOVENOUS (AV) 42mm x 41cm ARTEGRAFT LEFT UPPER ARM;  Surgeon: Waynetta Sandy, MD;  Location: [place];  Service: Vascular;;  . INSERTION PROSTATE RADIATION SEED    . IR FLUORO GUIDE CV LINE RIGHT  02/18/2018  . IR REMOVAL TUN CV CATH W/O FL  12/06/2018  . IR US GUIDE VASC ACCESS RIGHT  02/18/2018  . LEFT HEART CATH AND CORONARY ANGIOGRAPHY N/A 02/20/2018   Procedure: LEFT HEART CATH AND CORONARY ANGIOGRAPHY;  Surgeon: Jettie Booze, MD;  Location: [place] CV LAB;  Service: Cardiovascular;  Laterality: N/A;  . NM MYOCAR PERF WALL MOTION  02/21/2010   normal  . RADIOACTIVE SEED IMPLANT  2010   prostate cancer  . THROMBECTOMY W/ EMBOLECTOMY Left 02/26/2018   Procedure: THROMBECTOMY ARTERIOVENOUS GRAFT;  Surgeon: Angelia Mould, MD;  Location: [place];  Service: Vascular;  Laterality: Left;  . ULTRASOUND GUIDANCE FOR VASCULAR  ACCESS  02/20/2018   Procedure: Ultrasound Guidance For Vascular Access;  Surgeon: Jettie Booze, MD;  Location: [place] CV LAB;  Service: Cardiovascular;;  . UPPER EXTREMITY VENOGRAPHY N/A 07/08/2018   Procedure: UPPER EXTREMITY VENOGRAPHY;  Surgeon: Waynetta Sandy, MD;  Location: [place] CV LAB;  Service: Cardiovascular;  Laterality: N/A;  Bilateral  . US ECHOCARDIOGRAPHY  02/06/2012   mild LVH,LA mod. dilated,mild-mod. 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 AI.   Family History  Problem Relation Age of Onset  . Hypertension Mother   . Thyroid disease Mother   . Cholelithiasis Daughter   . Cholelithiasis Son   . Hypertension Maternal Grandmother   . Diabetes Maternal Grandmother   . Heart attack Neg Hx   . Hyperlipidemia Neg Hx    Social History   Socioeconomic History  . Marital status: Married    Spouse name: Not on file  . Number of children: 2  . Years of education: 46  . Highest education level: Not on file  Occupational History  . Occupation: retired, picks up Aeronautical engineer: RETIRED  Social Needs  . Financial resource strain: Not on file  . Food insecurity:    Worry: Not on file    Inability: Not on file  . Transportation needs:    Medical: Not on file    Non-medical: Not on file  Tobacco Use  . Smoking status: Former Smoker    Packs/day: 1.00    Years: 30.00    Pack years: 30.00    Types: Cigarettes    Last attempt to quit: 2006    Years since quitting: 14.1  . Smokeless tobacco: Never Used  Substance and Sexual Activity  . Alcohol use: Yes    Comment: once a week-wine  . Drug use: Never  . Sexual activity: Not Currently  Lifestyle  . Physical activity:    Days per week: Not on file    Minutes per session: Not on file  . Stress: Not on file  Relationships  . Social connections:    Talks on phone: Not on file    Gets together: Not on file    Attends religious service: Not on file    Active  member of club or organization: Not on file  Attends meetings of clubs or organizations: Not on file    Relationship status: Not on file  Other Topics Concern  . Not on file  Social History Narrative   ** Merged History Encounter **       Stays active at home Regular exercise: no Drinks 2 cups of coffee a week, 1 mountain dew soda a day.   Tobacco Counseling Counseling given: Not Answered   Clinical Intake: Pain : No/denies pain   Activities of Daily Living In your present state of health, do you have any difficulty performing the following activities: 01/17/2019 11/01/2018  Hearing? N N  Vision? N N  Difficulty concentrating or making decisions? N N  Walking or climbing stairs? Y Y  Comment uses cane -  Dressing or bathing? N N  Doing errands, shopping? N N  Preparing Food and eating ? N -  Using the Toilet? N -  In the past six months, have you accidently leaked urine? N -  Do you have problems with loss of bowel control? N -  Managing your Medications? N -  Managing your Finances? N -  Housekeeping or managing your Housekeeping? Y -  Some recent data might be hidden     Immunizations and Health Maintenance Immunization History  Administered Date(s) Administered  . Hepatitis A, Adult 05/20/2014, 06/26/2016  . Hepatitis B, adult 05/20/2014, 06/26/2016  . Influenza Split 08/23/2011, 10/04/2012  . Influenza Whole 10/04/2010  . Influenza, High Dose Seasonal PF 09/26/2013, 09/24/2015, 08/12/2018  . Influenza,inj,Quad PF,6+ Mos 07/29/2014, 10/03/2017  . Pneumococcal Conjugate-13 03/25/2018  . Pneumococcal Polysaccharide-23 12/26/2010, 06/26/2016  . Td 12/26/2010  . Zoster 08/23/2011   There are no preventive care reminders to display for this patient.  Patient Care Team: [name], NP as PCP - General (Internal Medicine) Tommy Medal, Lavell Islam, MD as PCP - Infectious Diseases (Infectious Diseases) Croitoru, Dani Gobble, MD as PCP - Cardiology  (Cardiology) Cl[place] Jacks, MD (Ophthalmology) Prue any recent Medical Services you may have received from other than [facility] providers in the past year (date may be approximate).    Assessment:   This is a routine wellness examination for David [name]. Physical assessment deferred to PCP.  Hearing/Vision screen  Visual Acuity Screening   Right eye Left eye Both eyes  Without correction: 20/50 20/50 20/50   With correction:     Hearing Screening Comments: Failed whisper test  Dietary issues and exercise activities discussed: Current Exercise Habits: The patient does not participate in regular exercise at present, Exercise limited by: None identified   Diet (meal preparation, eat out, water intake, caffeinated beverages, dairy products, fruits and vegetables): 24 hour recall Breakfast: eggs and grits and bacon Lunch: cereal Dinner:  [employer]   Fluid restriction of 46 oz per day.   Goals    . go to [facility]      Depression Screen PHQ 2/9 Scores 01/17/2019 07/03/2018 04/24/2018 02/25/2018  PHQ - 2 Score 0 0 0 0    Fall Risk Fall Risk  01/17/2019 01/01/2019 07/03/2018 04/24/2018 12/31/2017  Falls in the past year? 0 0 No No No  Number falls in past yr: - - - - -  Injury with Fall? - - - - -  Risk for fall due to : - Impaired vision;Impaired balance/gait;Impaired mobility - - -  Follow up - Falls evaluation completed - - -     Cognitive Function: MMSE - Mini Mental State Exam 01/17/2019  Orientation to time 5  Orientation to Place 5  Registration 3  Attention/ Calculation 5  Recall 1  Language- name 2 objects 2  Language- repeat 0  Language- follow 3 step command 3  Language- read & follow direction 1  Write a sentence 1  Copy design 1  Total score 27        Screening Tests Health Maintenance  Topic Date Due  . TETANUS/TDAP  12/26/2020  . INFLUENZA VACCINE  Completed  . PNA vac Low Risk Adult  Completed        Plan:    Please  schedule your next medicare wellness visit with me in 1 yr.  Continue to eat heart healthy diet (full of fruits, vegetables, whole grains, lean protein, water--limit salt, fat, and sugar intake) and increase physical activity as tolerated.  Continue doing brain stimulating activities (puzzles, reading, adult coloring books, staying active) to keep memory sharp.     I have personally reviewed and noted the following in the patient's chart:   . Medical and social history . Use of alcohol, tobacco or illicit drugs  . Current medications and supplements . Functional ability and status . Nutritional status . Physical activity . Advanced directives . List of other physicians . Hospitalizations, surgeries, and ER visits in previous 12 months . Vitals . Screenings to include cognitive, depression, and falls . Referrals and appointments  In addition, I have reviewed and discussed with patient certain preventive protocols, quality metrics, and best practice recommendations. A written personalized care plan for preventive services as well as general preventive health recommendations were provided to patient.     Shela Nevin, [place]   01/17/2019

## 2019-01-17 NOTE — Progress Notes (Signed)
RN note reviewed and agree.  Jaelee Laughter S O'Sullivan NP 

## 2019-01-18 DIAGNOSIS — N2581 Secondary hyperparathyroidism of renal origin: Secondary | ICD-10-CM | POA: Diagnosis not present

## 2019-01-18 DIAGNOSIS — N186 End stage renal disease: Secondary | ICD-10-CM | POA: Diagnosis not present

## 2019-01-21 DIAGNOSIS — R52 Pain, unspecified: Secondary | ICD-10-CM | POA: Insufficient documentation

## 2019-01-21 DIAGNOSIS — N186 End stage renal disease: Secondary | ICD-10-CM | POA: Diagnosis not present

## 2019-01-21 DIAGNOSIS — N2581 Secondary hyperparathyroidism of renal origin: Secondary | ICD-10-CM | POA: Diagnosis not present

## 2019-01-23 DIAGNOSIS — N186 End stage renal disease: Secondary | ICD-10-CM | POA: Diagnosis not present

## 2019-01-23 DIAGNOSIS — N2581 Secondary hyperparathyroidism of renal origin: Secondary | ICD-10-CM | POA: Diagnosis not present

## 2019-01-24 LAB — PROTIME-INR: INR: 2.2 — AB (ref 0.9–1.1)

## 2019-01-25 DIAGNOSIS — N2581 Secondary hyperparathyroidism of renal origin: Secondary | ICD-10-CM | POA: Diagnosis not present

## 2019-01-25 DIAGNOSIS — N186 End stage renal disease: Secondary | ICD-10-CM | POA: Diagnosis not present

## 2019-01-27 DIAGNOSIS — Z961 Presence of intraocular lens: Secondary | ICD-10-CM | POA: Diagnosis not present

## 2019-01-27 DIAGNOSIS — H04123 Dry eye syndrome of bilateral lacrimal glands: Secondary | ICD-10-CM | POA: Diagnosis not present

## 2019-01-27 DIAGNOSIS — H401133 Primary open-angle glaucoma, bilateral, severe stage: Secondary | ICD-10-CM | POA: Diagnosis not present

## 2019-01-28 ENCOUNTER — Other Ambulatory Visit: Payer: Self-pay

## 2019-01-28 DIAGNOSIS — N186 End stage renal disease: Secondary | ICD-10-CM | POA: Diagnosis not present

## 2019-01-28 DIAGNOSIS — N2581 Secondary hyperparathyroidism of renal origin: Secondary | ICD-10-CM | POA: Diagnosis not present

## 2019-01-28 MED ORDER — WARFARIN SODIUM 5 MG PO TABS: 5.0000 mg | ORAL_TABLET | Freq: Every day | ORAL | 3 refills | Status: DC

## 2019-01-30 ENCOUNTER — Other Ambulatory Visit: Payer: Self-pay | Admitting: Pharmacist Clinician (PhC)/ Clinical Pharmacy Specialist

## 2019-01-30 ENCOUNTER — Ambulatory Visit (INDEPENDENT_AMBULATORY_CARE_PROVIDER_SITE_OTHER): Payer: Medicare HMO | Admitting: Pharmacist

## 2019-01-30 DIAGNOSIS — I482 Chronic atrial fibrillation, unspecified: Secondary | ICD-10-CM

## 2019-01-30 DIAGNOSIS — Z5181 Encounter for therapeutic drug level monitoring: Secondary | ICD-10-CM

## 2019-01-30 DIAGNOSIS — N2581 Secondary hyperparathyroidism of renal origin: Secondary | ICD-10-CM | POA: Diagnosis not present

## 2019-01-30 DIAGNOSIS — N186 End stage renal disease: Secondary | ICD-10-CM | POA: Diagnosis not present

## 2019-01-30 MED ORDER — WARFARIN SODIUM 5 MG PO TABS: ORAL_TABLET | ORAL | 1 refills | Status: DC

## 2019-02-01 DIAGNOSIS — B20 Human immunodeficiency virus [HIV] disease: Secondary | ICD-10-CM | POA: Diagnosis not present

## 2019-02-01 DIAGNOSIS — Z992 Dependence on renal dialysis: Secondary | ICD-10-CM | POA: Diagnosis not present

## 2019-02-01 DIAGNOSIS — N186 End stage renal disease: Secondary | ICD-10-CM | POA: Diagnosis not present

## 2019-02-01 DIAGNOSIS — N2581 Secondary hyperparathyroidism of renal origin: Secondary | ICD-10-CM | POA: Diagnosis not present

## 2019-02-02 HISTORY — PX: TRANSTHORACIC ECHOCARDIOGRAM: SHX275

## 2019-02-04 DIAGNOSIS — N186 End stage renal disease: Secondary | ICD-10-CM | POA: Diagnosis not present

## 2019-02-04 DIAGNOSIS — N2581 Secondary hyperparathyroidism of renal origin: Secondary | ICD-10-CM | POA: Diagnosis not present

## 2019-02-06 DIAGNOSIS — N2581 Secondary hyperparathyroidism of renal origin: Secondary | ICD-10-CM | POA: Diagnosis not present

## 2019-02-06 DIAGNOSIS — N186 End stage renal disease: Secondary | ICD-10-CM | POA: Diagnosis not present

## 2019-02-08 DIAGNOSIS — N186 End stage renal disease: Secondary | ICD-10-CM | POA: Diagnosis not present

## 2019-02-08 DIAGNOSIS — N2581 Secondary hyperparathyroidism of renal origin: Secondary | ICD-10-CM | POA: Diagnosis not present

## 2019-02-10 ENCOUNTER — Emergency Department (HOSPITAL_COMMUNITY): Payer: Medicare HMO

## 2019-02-10 ENCOUNTER — Encounter (HOSPITAL_COMMUNITY): Payer: Self-pay | Admitting: Emergency Medicine

## 2019-02-10 ENCOUNTER — Other Ambulatory Visit: Payer: Self-pay

## 2019-02-10 ENCOUNTER — Observation Stay (HOSPITAL_COMMUNITY)
Admission: EM | Admit: 2019-02-10 | Discharge: 2019-02-12 | Disposition: A | Discharge status: Home / Self Care | Payer: Medicare HMO | Attending: Internal Medicine | Admitting: Internal Medicine

## 2019-02-10 DIAGNOSIS — D72829 Elevated white blood cell count, unspecified: Secondary | ICD-10-CM | POA: Insufficient documentation

## 2019-02-10 DIAGNOSIS — D509 Iron deficiency anemia, unspecified: Secondary | ICD-10-CM | POA: Insufficient documentation

## 2019-02-10 DIAGNOSIS — I499 Cardiac arrhythmia, unspecified: Secondary | ICD-10-CM | POA: Diagnosis not present

## 2019-02-10 DIAGNOSIS — Z8673 Personal history of transient ischemic attack (TIA), and cerebral infarction without residual deficits: Secondary | ICD-10-CM | POA: Insufficient documentation

## 2019-02-10 DIAGNOSIS — E785 Hyperlipidemia, unspecified: Secondary | ICD-10-CM | POA: Diagnosis not present

## 2019-02-10 DIAGNOSIS — I132 Hypertensive heart and chronic kidney disease with heart failure and with stage 5 chronic kidney disease, or end stage renal disease: Secondary | ICD-10-CM | POA: Diagnosis not present

## 2019-02-10 DIAGNOSIS — J189 Pneumonia, unspecified organism: Secondary | ICD-10-CM | POA: Insufficient documentation

## 2019-02-10 DIAGNOSIS — D631 Anemia in chronic kidney disease: Secondary | ICD-10-CM | POA: Insufficient documentation

## 2019-02-10 DIAGNOSIS — R000 Tachycardia, unspecified: Secondary | ICD-10-CM | POA: Insufficient documentation

## 2019-02-10 DIAGNOSIS — E8889 Other specified metabolic disorders: Secondary | ICD-10-CM | POA: Diagnosis not present

## 2019-02-10 DIAGNOSIS — H409 Unspecified glaucoma: Secondary | ICD-10-CM | POA: Diagnosis not present

## 2019-02-10 DIAGNOSIS — D539 Nutritional anemia, unspecified: Secondary | ICD-10-CM | POA: Diagnosis present

## 2019-02-10 DIAGNOSIS — Z992 Dependence on renal dialysis: Secondary | ICD-10-CM | POA: Diagnosis not present

## 2019-02-10 DIAGNOSIS — Z7901 Long term (current) use of anticoagulants: Secondary | ICD-10-CM | POA: Diagnosis not present

## 2019-02-10 DIAGNOSIS — J810 Acute pulmonary edema: Principal | ICD-10-CM | POA: Insufficient documentation

## 2019-02-10 DIAGNOSIS — M109 Gout, unspecified: Secondary | ICD-10-CM | POA: Insufficient documentation

## 2019-02-10 DIAGNOSIS — B20 Human immunodeficiency virus [HIV] disease: Secondary | ICD-10-CM | POA: Diagnosis present

## 2019-02-10 DIAGNOSIS — R0603 Acute respiratory distress: Secondary | ICD-10-CM | POA: Insufficient documentation

## 2019-02-10 DIAGNOSIS — Z9114 Patient's other noncompliance with medication regimen: Secondary | ICD-10-CM | POA: Insufficient documentation

## 2019-02-10 DIAGNOSIS — I5042 Chronic combined systolic (congestive) and diastolic (congestive) heart failure: Secondary | ICD-10-CM | POA: Insufficient documentation

## 2019-02-10 DIAGNOSIS — R0602 Shortness of breath: Secondary | ICD-10-CM | POA: Diagnosis not present

## 2019-02-10 DIAGNOSIS — G4730 Sleep apnea, unspecified: Secondary | ICD-10-CM | POA: Diagnosis not present

## 2019-02-10 DIAGNOSIS — I480 Paroxysmal atrial fibrillation: Secondary | ICD-10-CM | POA: Diagnosis not present

## 2019-02-10 DIAGNOSIS — R0902 Hypoxemia: Secondary | ICD-10-CM | POA: Diagnosis not present

## 2019-02-10 DIAGNOSIS — R069 Unspecified abnormalities of breathing: Secondary | ICD-10-CM | POA: Diagnosis not present

## 2019-02-10 DIAGNOSIS — Z888 Allergy status to other drugs, medicaments and biological substances status: Secondary | ICD-10-CM | POA: Insufficient documentation

## 2019-02-10 DIAGNOSIS — I428 Other cardiomyopathies: Secondary | ICD-10-CM | POA: Insufficient documentation

## 2019-02-10 DIAGNOSIS — Z87891 Personal history of nicotine dependence: Secondary | ICD-10-CM | POA: Insufficient documentation

## 2019-02-10 DIAGNOSIS — N186 End stage renal disease: Secondary | ICD-10-CM | POA: Diagnosis not present

## 2019-02-10 DIAGNOSIS — I447 Left bundle-branch block, unspecified: Secondary | ICD-10-CM | POA: Insufficient documentation

## 2019-02-10 DIAGNOSIS — K219 Gastro-esophageal reflux disease without esophagitis: Secondary | ICD-10-CM | POA: Diagnosis not present

## 2019-02-10 DIAGNOSIS — Z8546 Personal history of malignant neoplasm of prostate: Secondary | ICD-10-CM | POA: Insufficient documentation

## 2019-02-10 DIAGNOSIS — R0689 Other abnormalities of breathing: Secondary | ICD-10-CM | POA: Diagnosis not present

## 2019-02-10 DIAGNOSIS — Z8249 Family history of ischemic heart disease and other diseases of the circulatory system: Secondary | ICD-10-CM | POA: Insufficient documentation

## 2019-02-10 DIAGNOSIS — Z79899 Other long term (current) drug therapy: Secondary | ICD-10-CM | POA: Insufficient documentation

## 2019-02-10 MED ORDER — NITROGLYCERIN 2 % TD OINT
1.0000 [in_us] | TOPICAL_OINTMENT | Freq: Once | TRANSDERMAL | Status: AC
  Administered 2019-02-11: 1 [in_us] via TOPICAL
  Filled 2019-02-10: qty 1

## 2019-02-10 MED ORDER — FUROSEMIDE 10 MG/ML IJ SOLN
40.0000 mg | Freq: Once | INTRAMUSCULAR | Status: AC
  Administered 2019-02-11: 40 mg via INTRAVENOUS
  Filled 2019-02-10: qty 4

## 2019-02-10 NOTE — ED Triage Notes (Signed)
Patient with sudden onset of shortness of breath.  Patient denies any chest pain at this time.  Patient is a dialysis patient, is compliant with dialysis.  He goes on Tu,TH, Sat.  Patient is mildly diaphoretic, resp rate in 30s.

## 2019-02-11 ENCOUNTER — Encounter (HOSPITAL_COMMUNITY): Payer: Self-pay | Admitting: Family Medicine

## 2019-02-11 ENCOUNTER — Other Ambulatory Visit: Payer: Self-pay

## 2019-02-11 DIAGNOSIS — N2581 Secondary hyperparathyroidism of renal origin: Secondary | ICD-10-CM | POA: Diagnosis not present

## 2019-02-11 DIAGNOSIS — J181 Lobar pneumonia, unspecified organism: Secondary | ICD-10-CM

## 2019-02-11 DIAGNOSIS — Z21 Asymptomatic human immunodeficiency virus [HIV] infection status: Secondary | ICD-10-CM | POA: Diagnosis not present

## 2019-02-11 DIAGNOSIS — J810 Acute pulmonary edema: Secondary | ICD-10-CM | POA: Diagnosis not present

## 2019-02-11 DIAGNOSIS — D631 Anemia in chronic kidney disease: Secondary | ICD-10-CM | POA: Diagnosis not present

## 2019-02-11 DIAGNOSIS — I480 Paroxysmal atrial fibrillation: Secondary | ICD-10-CM | POA: Diagnosis not present

## 2019-02-11 DIAGNOSIS — N186 End stage renal disease: Secondary | ICD-10-CM

## 2019-02-11 DIAGNOSIS — Z992 Dependence on renal dialysis: Secondary | ICD-10-CM | POA: Diagnosis not present

## 2019-02-11 LAB — MRSA PCR SCREENING: MRSA by PCR: NEGATIVE

## 2019-02-11 LAB — CBC WITH DIFFERENTIAL/PLATELET
Abs Immature Granulocytes: 0.04 10*3/uL (ref 0.00–0.07)
Abs Immature Granulocytes: 0.1 10*3/uL — ABNORMAL HIGH (ref 0.00–0.07)
Basophils Absolute: 0.1 10*3/uL (ref 0.0–0.1)
Basophils Absolute: 0.1 10*3/uL (ref 0.0–0.1)
Basophils Relative: 1 %
Basophils Relative: 1 %
Eosinophils Absolute: 0.1 10*3/uL (ref 0.0–0.5)
Eosinophils Absolute: 0.4 10*3/uL (ref 0.0–0.5)
Eosinophils Relative: 1 %
Eosinophils Relative: 2 %
HCT: 32.2 % — ABNORMAL LOW (ref 39.0–52.0)
HCT: 34.8 % — ABNORMAL LOW (ref 39.0–52.0)
HEMOGLOBIN: 11.4 g/dL — AB (ref 13.0–17.0)
Hemoglobin: 10.3 g/dL — ABNORMAL LOW (ref 13.0–17.0)
Immature Granulocytes: 0 %
Immature Granulocytes: 1 %
LYMPHS PCT: 15 %
Lymphocytes Relative: 12 %
Lymphs Abs: 1.2 10*3/uL (ref 0.7–4.0)
Lymphs Abs: 2.3 10*3/uL (ref 0.7–4.0)
MCH: 34.7 pg — ABNORMAL HIGH (ref 26.0–34.0)
MCH: 35.6 pg — ABNORMAL HIGH (ref 26.0–34.0)
MCHC: 32 g/dL (ref 30.0–36.0)
MCHC: 32.8 g/dL (ref 30.0–36.0)
MCV: 108.4 fL — AB (ref 80.0–100.0)
MCV: 108.8 fL — ABNORMAL HIGH (ref 80.0–100.0)
MONO ABS: 0.8 10*3/uL (ref 0.1–1.0)
Monocytes Absolute: 0.6 10*3/uL (ref 0.1–1.0)
Monocytes Relative: 5 %
Monocytes Relative: 6 %
Neutro Abs: 11.1 10*3/uL — ABNORMAL HIGH (ref 1.7–7.7)
Neutro Abs: 8.5 10*3/uL — ABNORMAL HIGH (ref 1.7–7.7)
Neutrophils Relative %: 76 %
Neutrophils Relative %: 80 %
PLATELETS: 262 10*3/uL (ref 150–400)
Platelets: 271 10*3/uL (ref 150–400)
RBC: 2.97 MIL/uL — ABNORMAL LOW (ref 4.22–5.81)
RBC: 3.2 MIL/uL — AB (ref 4.22–5.81)
RDW: 15.5 % (ref 11.5–15.5)
RDW: 15.6 % — ABNORMAL HIGH (ref 11.5–15.5)
WBC: 10.5 10*3/uL (ref 4.0–10.5)
WBC: 14.7 10*3/uL — ABNORMAL HIGH (ref 4.0–10.5)
nRBC: 0 % (ref 0.0–0.2)
nRBC: 0 % (ref 0.0–0.2)

## 2019-02-11 LAB — BASIC METABOLIC PANEL
ANION GAP: 16 — AB (ref 5–15)
Anion gap: 18 — ABNORMAL HIGH (ref 5–15)
BUN: 66 mg/dL — ABNORMAL HIGH (ref 8–23)
BUN: 71 mg/dL — ABNORMAL HIGH (ref 8–23)
CO2: 25 mmol/L (ref 22–32)
CO2: 25 mmol/L (ref 22–32)
Calcium: 8.9 mg/dL (ref 8.9–10.3)
Calcium: 8.9 mg/dL (ref 8.9–10.3)
Chloride: 94 mmol/L — ABNORMAL LOW (ref 98–111)
Chloride: 94 mmol/L — ABNORMAL LOW (ref 98–111)
Creatinine, Ser: 10.56 mg/dL — ABNORMAL HIGH (ref 0.61–1.24)
Creatinine, Ser: 10.82 mg/dL — ABNORMAL HIGH (ref 0.61–1.24)
GFR calc Af Amer: 5 mL/min — ABNORMAL LOW (ref >60)
GFR calc Af Amer: 5 mL/min — ABNORMAL LOW (ref >60)
GFR calc non Af Amer: 4 mL/min — ABNORMAL LOW (ref >60)
GFR calc non Af Amer: 4 mL/min — ABNORMAL LOW (ref >60)
GLUCOSE: 108 mg/dL — AB (ref 70–99)
Glucose, Bld: 175 mg/dL — ABNORMAL HIGH (ref 70–99)
Potassium: 3.8 mmol/L (ref 3.5–5.1)
Potassium: 5.1 mmol/L (ref 3.5–5.1)
Sodium: 135 mmol/L (ref 135–145)
Sodium: 137 mmol/L (ref 135–145)

## 2019-02-11 LAB — I-STAT TROPONIN, ED: TROPONIN I, POC: 0.04 ng/mL (ref 0.00–0.08)

## 2019-02-11 LAB — BRAIN NATRIURETIC PEPTIDE: B Natriuretic Peptide: 1054.2 pg/mL — ABNORMAL HIGH (ref 0.0–100.0)

## 2019-02-11 LAB — PROTIME-INR
INR: 2.9 — AB (ref 0.8–1.2)
Prothrombin Time: 29.5 seconds — ABNORMAL HIGH (ref 11.4–15.2)

## 2019-02-11 LAB — STREP PNEUMONIAE URINARY ANTIGEN: Strep Pneumo Urinary Antigen: NEGATIVE

## 2019-02-11 MED ORDER — AMIODARONE HCL 200 MG PO TABS
200.0000 mg | ORAL_TABLET | Freq: Every day | ORAL | Status: DC
  Administered 2019-02-11: 200 mg via ORAL
  Filled 2019-02-11 (×2): qty 1

## 2019-02-11 MED ORDER — HYDROCODONE-ACETAMINOPHEN 5-325 MG PO TABS: 1.0000 | ORAL_TABLET | Freq: Four times a day (QID) | ORAL | Status: DC | PRN

## 2019-02-11 MED ORDER — ACETAMINOPHEN 325 MG PO TABS: 650.0000 mg | ORAL_TABLET | Freq: Four times a day (QID) | ORAL | Status: DC | PRN

## 2019-02-11 MED ORDER — SODIUM CHLORIDE 0.9% FLUSH: 3.0000 mL | Freq: Two times a day (BID) | INTRAVENOUS | Status: DC

## 2019-02-11 MED ORDER — FERRIC CITRATE 1 GM 210 MG(FE) PO TABS
420.0000 mg | ORAL_TABLET | Freq: Three times a day (TID) | ORAL | Status: DC
  Administered 2019-02-11: 420 mg via ORAL
  Filled 2019-02-11 (×6): qty 2

## 2019-02-11 MED ORDER — ONDANSETRON HCL 4 MG PO TABS: 4.0000 mg | ORAL_TABLET | Freq: Four times a day (QID) | ORAL | Status: DC | PRN

## 2019-02-11 MED ORDER — MIDODRINE HCL 5 MG PO TABS
10.0000 mg | ORAL_TABLET | Freq: Two times a day (BID) | ORAL | Status: DC
  Administered 2019-02-11: 10 mg via ORAL
  Filled 2019-02-11 (×4): qty 2

## 2019-02-11 MED ORDER — ACETAMINOPHEN 650 MG RE SUPP: 650.0000 mg | Freq: Four times a day (QID) | RECTAL | Status: DC | PRN

## 2019-02-11 MED ORDER — WARFARIN - PHARMACIST DOSING INPATIENT: Freq: Every day | Status: DC

## 2019-02-11 MED ORDER — VANCOMYCIN HCL IN DEXTROSE 1-5 GM/200ML-% IV SOLN
1000.0000 mg | Freq: Once | INTRAVENOUS | Status: AC
  Administered 2019-02-11: 1000 mg via INTRAVENOUS
  Filled 2019-02-11: qty 200

## 2019-02-11 MED ORDER — ONDANSETRON HCL 4 MG/2ML IJ SOLN: 4.0000 mg | Freq: Four times a day (QID) | INTRAMUSCULAR | Status: DC | PRN

## 2019-02-11 MED ORDER — VANCOMYCIN HCL IN DEXTROSE 1-5 GM/200ML-% IV SOLN
1000.0000 mg | INTRAVENOUS | Status: DC
  Administered 2019-02-11: 1000 mg via INTRAVENOUS

## 2019-02-11 MED ORDER — PIPERACILLIN-TAZOBACTAM 3.375 G IVPB 30 MIN
3.3750 g | Freq: Once | INTRAVENOUS | Status: AC
  Administered 2019-02-11: 3.375 g via INTRAVENOUS
  Filled 2019-02-11: qty 50

## 2019-02-11 MED ORDER — WARFARIN SODIUM 10 MG PO TABS
10.0000 mg | ORAL_TABLET | Freq: Every day | ORAL | Status: DC
  Administered 2019-02-11: 10 mg via ORAL
  Filled 2019-02-11 (×2): qty 1

## 2019-02-11 MED ORDER — SODIUM CHLORIDE 0.9% FLUSH
3.0000 mL | Freq: Two times a day (BID) | INTRAVENOUS | Status: DC
  Administered 2019-02-11: 3 mL via INTRAVENOUS

## 2019-02-11 MED ORDER — SODIUM CHLORIDE 0.9% FLUSH: 3.0000 mL | INTRAVENOUS | Status: DC | PRN

## 2019-02-11 MED ORDER — MIDODRINE HCL 5 MG PO TABS
ORAL_TABLET | ORAL | Status: AC
  Filled 2019-02-11: qty 2

## 2019-02-11 MED ORDER — SODIUM CHLORIDE 0.9 % IV SOLN
1.0000 g | INTRAVENOUS | Status: DC
  Administered 2019-02-11: 1 g via INTRAVENOUS
  Filled 2019-02-11 (×2): qty 1

## 2019-02-11 MED ORDER — CHLORHEXIDINE GLUCONATE CLOTH 2 % EX PADS
6.0000 | MEDICATED_PAD | Freq: Every day | CUTANEOUS | Status: DC
  Administered 2019-02-12: 6 via TOPICAL

## 2019-02-11 MED ORDER — VANCOMYCIN HCL IN DEXTROSE 1-5 GM/200ML-% IV SOLN
INTRAVENOUS | Status: AC
  Filled 2019-02-11: qty 200

## 2019-02-11 MED ORDER — SODIUM CHLORIDE 0.9 % IV SOLN: 250.0000 mL | INTRAVENOUS | Status: DC | PRN

## 2019-02-11 MED ORDER — BICTEGRAVIR-EMTRICITAB-TENOFOV 50-200-25 MG PO TABS
1.0000 | ORAL_TABLET | Freq: Every day | ORAL | Status: DC
  Filled 2019-02-11 (×2): qty 1

## 2019-02-11 NOTE — ED Notes (Signed)
Took patient off Bipap, placed on [place] 4L.  Dr Randal Buba notified.

## 2019-02-11 NOTE — Progress Notes (Signed)
[place] TEAM [place]  ZOX:[id number] DOB: 1941/07/08 DOA: 02/10/2019 PCP: [name], NP    Brief Narrative:  82XB w/ a hx of HIV (CD4 410 and VL undetectable in January), paroxysmal atrial fibrillation on warfarin, and ESRD on hemodialysis who presented w/ SOB.  He completed dialysis on 02/08/2019 without incident.   In the ED the patient was found to be in acute respiratory distress. EKG featured sinus tachycardia with rate 116 and chronic LBBB. Chest x-ray was notable for cardiomegaly, vascular congestion, and diffuse interstitial and groundglass opacities concerning for edema. CBC featured a leukocytosis to 14,700 and a mild microcytic anemia.    Significant Events: 3/10 admit   Subjective: Pt is seen for a f/u visit.    Assessment & Plan:  Acute respiratory distress; pulmonary edema; possible PNA   ESRD on T/Th/Sat HD  Cut HD short by 3mins on 02/08/19 - underwent HD 3/10 as inpt - hx of noncompliance w/ fluid restrictions   HTN  Macrocytic Anemia of ESRD  Gout  HIV  Controlled - continue [name]    Paroxysmal atrial fibrillation  CHADS-VASc is 5 - continue warfarin and amiodarone    DVT prophylaxis: warfarin  Code Status: FULL CODE Family Communication:  Disposition Plan:   Consultants:  Nephrology   Antimicrobials:  Vanc Zosyn   Objective: Blood pressure (!) 107/56, pulse 84, temperature 98.4 F (36.9 C), temperature source Oral, resp. rate (!) 21, height 6\' 2"  (1.88 m), weight 85 kg, SpO2 100 %.  Intake/Output Summary (Last 24 hours) at 02/11/2019 1345 Last data filed at 02/11/2019 1154 Gross per 24 hour  Intake -  Output 2000 ml  Net -2000 ml   Filed Weights   02/11/19 0200 02/11/19 0742 02/11/19 1334  Weight: 86.7 kg 85.8 kg 85 kg    Examination: Pt was seen for a f/u visit.    CBC: Recent Labs  Lab 02/11/19 0015 02/11/19 0337  WBC 14.7* 10.5  NEUTROABS 11.1* 8.5*  HGB 11.4* 10.3*  HCT  34.8* 32.2*  MCV 108.8* 108.4*  PLT 271 147   Basic Metabolic Panel: Recent Labs  Lab 02/11/19 0015 02/11/19 0337  NA 137 135  K 3.8 5.1  CL 94* 94*  CO2 25 25  GLUCOSE 175* 108*  BUN 66* 71*  CREATININE 10.56* 10.82*  CALCIUM 8.9 8.9   GFR: Estimated Creatinine Clearance: 6.8 mL/min (A) (by C-G formula based on SCr of 10.82 mg/dL (H)).  Liver Function Tests: No results for input(s): AST, ALT, ALKPHOS, BILITOT, PROT, ALBUMIN in the last 168 hours. No results for input(s): LIPASE, AMYLASE in the last 168 hours. No results for input(s): AMMONIA in the last 168 hours.  Coagulation Profile: Recent Labs  Lab 02/11/19 0018  INR 2.9*    HbA1C: Hgb A1c MFr Bld  Date/Time Value Ref Range Status  01/21/2018 04:52 AM 6.1 (H) 4.8 - 5.6 % Final    Comment:    (NOTE) Pre diabetes:          5.7%-6.4% Diabetes:              >6.4% Glycemic control for   <7.0% adults with diabetes   06/28/2016 09:15 AM 5.8 4.6 - 6.5 % Final    Comment:    Glycemic Control Guidelines for People with Diabetes:Non Diabetic:  <6%Goal of Therapy: <7%Additional Action Suggested:  >8%     CBG: No results for input(s): GLUCAP in the last 168 hours.  No results found  for this or any previous visit (from the past 240 hour(s)).   Scheduled Meds: . amiodarone  200 mg Oral Daily  . bictegravir-emtricitabine-tenofovir AF  1 tablet Oral Daily  . Chlorhexidine Gluconate Cloth  6 each Topical Q0600  . ferric citrate  420 mg Oral TID WC  . midodrine      . midodrine  10 mg Oral BID WC  . sodium chloride flush  3 mL Intravenous Q12H  . sodium chloride flush  3 mL Intravenous Q12H  . warfarin  10 mg Oral q1800  . Warfarin - Pharmacist Dosing Inpatient   Does not apply q1800   Continuous Infusions: . sodium chloride    . ceFEPime (MAXIPIME) IV    . vancomycin    . vancomycin 1,000 mg (02/11/19 1054)     LOS: 0 days   Time spent: No Charge  Cherene Altes, MD [facility] Office   [phone] Pager - Text Page per Amion as per below:  On-Call/Text Page:      [email or web address]  If 7PM-7AM, please contact night-coverage [email or web address] 02/11/2019, 1:45 PM

## 2019-02-11 NOTE — H&P (Signed)
History and Physical    [facility] UMP:[id number] DOB: Jun 20, 1941 DOA: 02/10/2019  PCP: [name], NP   Patient coming from: Home   Chief Complaint: SOB   HPI: David [name] is a 78 y.o. male with medical history significant for HIV (CD4 410 and VL undetectable in January), paroxysmal atrial fibrillation on warfarin, and end-stage renal disease on hemodialysis, now presenting to the emergency department for evaluation of shortness of breath.  He reports developing dyspnea yesterday evening that worsened rapidly at home, prompting him to call EMS.  Patient reports completing dialysis on 02/08/2019 without incident, denies any chest pain, denies any cough or fevers, and denies any indiscretions with his diet or fluid intake.  Patient denies any swelling or tenderness in the lower extremities.  ED Course: Upon arrival to the ED, patient is found to be in acute respiratory distress.  EKG features sinus tachycardia with rate 116 and chronic LBBB.  Chest x-ray is notable for cardiomegaly, vascular congestion, and diffuse interstitial and groundglass opacities concerning for edema, as well as confluent airspace disease at the bases that could reflect atelectasis or superimposed pneumonia.  Chemistry panel is notable for normal potassium, normal bicarb, BUN of 66, and glucose 175.  CBC features a leukocytosis to 14,700 and a mild microcytic anemia.  INR is therapeutic.  Troponin is normal and BNP elevated to 1054.  Nephrology was consulted by the ED physician and the patient was treated with 40 mg IV Lasix, vancomycin, Zosyn, and BiPAP.  Blood cultures were ordered.  Work of breathing has improved in the ED and the patient will be observed for ongoing evaluation and management.  Review of Systems:  All other systems reviewed and apart from HPI, are negative.  Past Medical History:  Diagnosis Date  . Acute on chronic systolic and diastolic heart failure, NYHA class 4 ([place])   .  Anemia, iron deficiency 11/15/2011  . Arthritis    "hands, right knee, feet" (02/21/2018)  . Atrial fibrillation ([place])   . Cancer [facility])    hx of prostate; s/p radioactive seed implant 10/2009 Dr Janice Norrie  . Cardiac arrest ([place]) 02/17/2018  . CHF (congestive heart failure) ([place])   . Chronic lower back pain   . CKD (chronic kidney disease) stage V requiring chronic dialysis ([place])   . ESRD on dialysis ([place])    started 02/2018  . GERD (gastroesophageal reflux disease)   . Glaucoma   . Gout    daily RX (02/21/2018)  . Heart murmur    "mild" per pt  . Hepatitis    years ago  . History of cardiac cath 2004   negative for CAD  . History of myocardial perfusion scan 02/2010   negative for coronary insufficiency (LVEF 27%)  . HIV (human immunodeficiency virus infection) ([place])   . HIV infection ([place])   . Hyperlipidemia   . Hypertension    followed by [facility] (Dr Dani Gobble Croitoru)  . Hypertension   . Nonischemic cardiomyopathy ([place])   . Pneumonia 11/2017  . Prostate cancer ([place])   . Sinus bradycardia   . Sleep apnea    does not use a cpap  . Stroke [facility])    "mini stroke" years ago  . Systolic and diastolic CHF, acute ([place]) 06/2010   felt to be secondary to hypertensive cardiomyopathy    Past Surgical History:  Procedure Laterality Date  . AV FISTULA PLACEMENT Right 10/13/2016   Procedure: ARTERIOVENOUS (AV) FISTULA CREATION;  Surgeon: Arvilla Meres  Early, MD;  Location: [address] OR;  Service: Vascular;  Laterality: Right;  . AV FISTULA PLACEMENT Right 10/13/2016   [name] [phone]  . AV FISTULA PLACEMENT Left 02/25/2018   Procedure: INSERTION OF ARTERIOVENOUS (AV) GORE-TEX GRAFT LEFT UPPER ARM;  Surgeon: Angelia Mould, MD;  Location: [place];  Service: Vascular;  Laterality: Left;  . AV FISTULA PLACEMENT Right 08/07/2018   Procedure: Creation of right arm brachiocephalic Fistula;  Surgeon: Waynetta Sandy, MD;  Location: [place];  Service: Vascular;  Laterality:  Right;  . BASCILIC VEIN TRANSPOSITION Left 06/24/2015   Procedure: BASILIC VEIN TRANSPOSITION;  Surgeon: Mal Misty, MD;  Location: [place];  Service: Vascular;  Laterality: Left;  . BASCILIC VEIN TRANSPOSITION Left 6/60/6301 Bascilic vein transposition (Left)   [name] [phone]  . CARDIAC CATHETERIZATION  02/04/2003   mildly depressed LV systolic fx EF 57%,DUKGUR coronaries/abdominal aorta/renal arteries.  Marland Kitchen CARDIAC CATHETERIZATION  2004  . CATARACT EXTRACTION, BILATERAL Bilateral   . COLONOSCOPY    . ESOPHAGOGASTRODUODENOSCOPY (EGD) WITH PROPOFOL N/A 05/05/2014   Procedure: ESOPHAGOGASTRODUODENOSCOPY (EGD) WITH PROPOFOL;  Surgeon: Missy Sabins, MD;  Location: [facility];  Service: Endoscopy;  Laterality: N/A;  . EYE SURGERY Bilateral    cataract surgery   . EYE SURGERY Bilateral    glaucoma surgery  . EYE SURGERY    . GLAUCOMA SURGERY Bilateral   . INSERTION OF ARTERIOVENOUS (AV) ARTEGRAFT ARM  10/09/2018   Procedure: INSERTION OF ARTERIOVENOUS (AV) 26mm x 41cm ARTEGRAFT LEFT UPPER ARM;  Surgeon: Waynetta Sandy, MD;  Location: [place];  Service: Vascular;;  . INSERTION PROSTATE RADIATION SEED    . IR FLUORO GUIDE CV LINE RIGHT  02/18/2018  . IR REMOVAL TUN CV CATH W/O FL  12/06/2018  . IR US GUIDE VASC ACCESS RIGHT  02/18/2018  . LEFT HEART CATH AND CORONARY ANGIOGRAPHY N/A 02/20/2018   Procedure: LEFT HEART CATH AND CORONARY ANGIOGRAPHY;  Surgeon: Jettie Booze, MD;  Location: [place] CV LAB;  Service: Cardiovascular;  Laterality: N/A;  . NM MYOCAR PERF WALL MOTION  02/21/2010   normal  . RADIOACTIVE SEED IMPLANT  2010   prostate cancer  . THROMBECTOMY W/ EMBOLECTOMY Left 02/26/2018   Procedure: THROMBECTOMY ARTERIOVENOUS GRAFT;  Surgeon: Angelia Mould, MD;  Location: [place];  Service: Vascular;  Laterality: Left;  . ULTRASOUND GUIDANCE FOR VASCULAR ACCESS  02/20/2018   Procedure: Ultrasound Guidance For Vascular Access;  Surgeon: Jettie Booze, MD;   Location: [place] CV LAB;  Service: Cardiovascular;;  . UPPER EXTREMITY VENOGRAPHY N/A 07/08/2018   Procedure: UPPER EXTREMITY VENOGRAPHY;  Surgeon: Waynetta Sandy, MD;  Location: [place] CV LAB;  Service: Cardiovascular;  Laterality: N/A;  Bilateral  . US ECHOCARDIOGRAPHY  02/06/2012   mild LVH,LA mod. dilated,mild-mod. 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 AI.     reports that he quit smoking about 14 years ago. His smoking use included cigarettes. He has a 30.00 pack-year smoking history. He has never used smokeless tobacco. He reports current alcohol use. He reports that he does not use drugs.  Allergies  Allergen Reactions  . Cozaar [Losartan Potassium] Other (See Comments)    Causes constipation     Family History  Problem Relation Age of Onset  . Hypertension Mother   . Thyroid disease Mother   . Cholelithiasis Daughter   . Cholelithiasis Son   . Hypertension Maternal Grandmother   . Diabetes Maternal Grandmother   . Heart attack  Neg Hx   . Hyperlipidemia Neg Hx      Prior to Admission medications   Medication Sig Start Date End Date Taking? Authorizing Provider  acetaminophen (TYLENOL) 500 MG tablet Take 1,000 mg by mouth every 6 (six) hours as needed for mild pain.    [provider]  allopurinol (ZYLOPRIM) 100 MG tablet TAKE 1 TABLET EVERY DAY 12/23/18   Croitoru, Mihai, MD  amiodarone (PACERONE) 200 MG tablet TAKE 1 TABLET(200 MG) BY MOUTH DAILY Patient taking differently: Take 200 mg by mouth daily.  10/29/18   Croitoru, Mihai, MD  AURYXIA 1 GM 210 MG(Fe) tablet Take 420 mg by mouth 3 (three) times daily with meals.  08/05/18   [provider]  bictegravir-emtricitabine-tenofovir AF ([name]) 50-200-25 MG TABS tablet Take 1 tablet by mouth daily. 01/01/19   Truman Hayward, MD  brimonidine (ALPHAGAN) 0.15 % ophthalmic solution Place 1 drop into the left eye 2 (two) times daily. 11/15/17   [provider]  dorzolamide-timolol (COSOPT) 22.3-6.8 MG/ML ophthalmic solution Place 1 drop into the left eye 2 (two) times daily. 12/17/17   [provider]  fluticasone (FLONASE) 50 MCG/ACT nasal spray Place 2 sprays into both nostrils daily. 11/13/18   [name], NP  latanoprost (XALATAN) 0.005 % ophthalmic solution Place 1 drop into the left eye at bedtime.     [provider]  midodrine (PROAMATINE) 10 MG tablet Take 1 tablet (10 mg total) by mouth as directed. Twice a day on the day of HD Tuesday, Thursday, Saturday. 10/23/18   Lavina Hamman, MD  montelukast (SINGULAIR) 10 MG tablet Take 1 tablet (10 mg total) by mouth at bedtime. 08/12/18   [name], NP  multivitamin (RENA-VIT) TABS tablet Take 1 tablet by mouth daily.    [provider]  Vitamin D, Ergocalciferol, (DRISDOL) 50000 UNITS CAPS capsule Take 50,000 Units by mouth every Friday.  04/10/15   [provider]  warfarin (COUMADIN) 5 MG tablet Take 2 tablets by mouth daily or as directed by coumadin clinic 01/30/19   [name], MD    Physical Exam: Vitals:   02/11/19 0100 02/11/19 0115 02/11/19 0130 02/11/19 0145  BP: (!) 125/92 (!) 148/71 133/75 129/73  Pulse: 98 97 92 88  Resp: (!) 25 (!) 26 (!) 23 (!) 23  Temp:      TempSrc:      SpO2: 95% 98% 95% 96%    Constitutional: Tachypneic, no pallor, no diaphoresis   Eyes: PERTLA, lids and conjunctivae normal ENMT: Mucous membranes are moist. Posterior pharynx clear of any exudate or lesions.   Neck: normal, supple, no masses, no thyromegaly Respiratory: Tachypnea, dyspnea with speech, rales bilaterally. No pallor or cyanosis.  Cardiovascular: S1 & S2 heard, regular rate and rhythm. No extremity edema.   Abdomen: No distension, no tenderness, soft. Bowel sounds active.  Musculoskeletal: no clubbing / cyanosis. No joint deformity upper and lower extremities.    Skin: no significant rashes, lesions, ulcers. Warm, dry,  well-perfused. Neurologic: no gross facial asymmetry. Sensation intact. Moving all extremities.  Psychiatric: Alert and oriented to person, place, and situation. Pleasant, cooperative.    Labs on Admission: I have personally reviewed following labs and imaging studies  CBC: Recent Labs  Lab 02/11/19 0015  WBC 14.7*  NEUTROABS 11.1*  HGB 11.4*  HCT 34.8*  MCV 108.8*  PLT 357   Basic Metabolic Panel: Recent Labs  Lab 02/11/19 0015  NA 137  K 3.8  CL 94*  CO2 25  GLUCOSE 175*  BUN 66*  CREATININE 10.56*  CALCIUM 8.9   GFR: CrCl cannot be calculated (Unknown ideal weight.). Liver Function Tests: No results for input(s): AST, ALT, ALKPHOS, BILITOT, PROT, ALBUMIN in the last 168 hours. No results for input(s): LIPASE, AMYLASE in the last 168 hours. No results for input(s): AMMONIA in the last 168 hours. Coagulation Profile: Recent Labs  Lab 02/11/19 0018  INR 2.9*   Cardiac Enzymes: No results for input(s): CKTOTAL, CKMB, CKMBINDEX, TROPONINI in the last 168 hours. BNP (last 3 results) Recent Labs    03/11/18 1524  PROBNP 230.0*   HbA1C: No results for input(s): HGBA1C in the last 72 hours. CBG: No results for input(s): GLUCAP in the last 168 hours. Lipid Profile: No results for input(s): CHOL, HDL, LDLCALC, TRIG, CHOLHDL, LDLDIRECT in the last 72 hours. Thyroid Function Tests: No results for input(s): TSH, T4TOTAL, FREET4, T3FREE, THYROIDAB in the last 72 hours. Anemia Panel: No results for input(s): VITAMINB12, FOLATE, FERRITIN, TIBC, IRON, RETICCTPCT in the last 72 hours. Urine analysis:    Component Value Date/Time   COLORURINE YELLOW 02/17/2018 0540   APPEARANCEUR CLOUDY (A) 02/17/2018 0540   LABSPEC 1.010 02/17/2018 0540   PHURINE 5.0 02/17/2018 0540   GLUCOSEU NEGATIVE 02/17/2018 0540   HGBUR LARGE (A) 02/17/2018 0540   BILIRUBINUR NEGATIVE 02/17/2018 0540   BILIRUBINUR negative 04/16/2013 1447   KETONESUR NEGATIVE 02/17/2018 0540   PROTEINUR  100 (A) 02/17/2018 0540   UROBILINOGEN 0.2 05/11/2014 1352   NITRITE NEGATIVE 02/17/2018 0540   LEUKOCYTESUR NEGATIVE 02/17/2018 0540   Sepsis Labs: @LABRCNTIP (procalcitonin:4,lacticidven:4) )No results found for this or any previous visit (from the past 240 hour(s)).   Radiological Exams on Admission: Dg Chest Portable 1 View  Result Date: 02/10/2019 CLINICAL DATA:  Shortness of breath EXAM: PORTABLE CHEST 1 VIEW COMPARISON:  12/04/2018, 04/30/2018 FINDINGS: Cardiomegaly with vascular congestion and diffuse bilateral interstitial and ground-glass opacity suspicious for pulmonary edema. Patchy consolidation at both lung bases. No pneumothorax. IMPRESSION: Cardiomegaly with vascular congestion and diffuse interstitial and ground-glass opacities suspicious for pulmonary edema. Confluent airspace disease at the lung bases may reflect atelectasis or superimposed pneumonia Electronically Signed   By: Donavan Foil M.D.   On: 02/10/2019 23:53    EKG: Independently reviewed. Sinus rhythm, chronic LBBB.   Assessment/Plan   1. Acute respiratory distress; pulmonary edema; possible PNA  - Presents with acute respiratory distress, found to have pulmonary edema on CXR with possible PNA involving lower lobes  - Nephrology consulted by ED physician and planning for inpatient HD  - Blood cultures ordered and empiric antibiotics started for possible PNA  - He has improved in ED but remains dyspneic at rest  - Anticipate improvement with HD; he denies cough or fever, but has leukocytosis and will be continued on empiric antibiotics for possible PNA   2. ESRD  - Reports completing HD on 02/08/19  - There is hypervolemia on admission; BP controlled and potassium normal  - Nephrology is consulting and much appreciated - SLIV, renally-dose medications    3. HIV  - CD4 410 and VL undetectable in January  - Continue [name]    4. Paroxysmal atrial fibrillation  - In sinus rhythm on admission  -  CHADS-VASc is at least 63 (age x2, CHF, TIA x2) - Continue warfarin and amiodarone    DVT prophylaxis: sq heparin  Code Status: Full  Family Communication: Discussed with patient  Consults called: None Admission status: observation  Vianne Bulls, MD [facility] Pager [phone]  If 7PM-7AM, please contact night-coverage [email or web address] Password [email or web address]  02/11/2019, 2:03 AM

## 2019-02-11 NOTE — Progress Notes (Signed)
Pharmacy Antibiotic Note  [facility] is a 78 y.o. male admitted on 02/10/2019 with pneumonia.  Pharmacy has been consulted for Vancomycin dosing. WBC 14.7. ESRD on HD TTS.   Plan: Vancomycin 2000 mg IV x 1, then 1000 mg IV qHD TTS Cefepime per MD-adjusted for HD Trend WBC, temp, HD schedule F/U infectious work-up Drug levels as indicated   Height: 6\' 2"  (188 cm) Weight: 191 lb 2.2 oz (86.7 kg) IBW/kg (Calculated) : 82.2  Temp (24hrs), Avg:96.5 F (35.8 C), Min:96.5 F (35.8 C), Max:96.5 F (35.8 C)  Recent Labs  Lab 02/11/19 0015  WBC 14.7*  CREATININE 10.56*    Estimated Creatinine Clearance: 6.9 mL/min (A) (by C-G formula based on SCr of 10.56 mg/dL (H)).    Allergies  Allergen Reactions  . Cozaar [Losartan Potassium] Other (See Comments)    Causes constipation      Narda Bonds 02/11/2019 2:26 AM

## 2019-02-11 NOTE — Procedures (Signed)
Patient seen on Hemodialysis. BP 125/81   Pulse 84   Temp (!) 96.5 F (35.8 C) (Temporal)   Resp (!) 21   Ht 6\' 2"  (1.88 m)   Wt 86.7 kg   SpO2 100%   BMI 24.54 kg/m   QB 400 mL/ min via AVF with buttonholes UF goal 3.5L  Breathing improved.  Still requiring 4L [place] O2.  Tolerating treatment without complaints at this time.   Madelon Lips MD [facility] pgr [phone] 8:49 AM

## 2019-02-11 NOTE — Care Management Obs Status (Signed)
[place] NOTIFICATION   Patient Details  Name: David [name] MRN: [id number] Date of Birth: Jul 30, 1941   Medicare Observation Status Notification Given:  Yes    Norina Buzzard, RN 02/11/2019, 2:50 PM

## 2019-02-11 NOTE — Consult Note (Signed)
Reason for Consult: To manage dialysis and dialysis related needs Referring Physician: Dr. Clyda Greener [facility] is an 78 y.o. male.  HPI: PT is a 33M with a PMH sig for ESRD TTS at [facility], HTN, HLD, HIV, Afib on Coumadin who presented to the ED overnight with SOB.  Pt reports the acute onset of SOB yesterday evening.  Worsened rapidly so he called EMS.  He denies associated fever/ chills, n/v, cough.    Came to ED where he was noted to have pulm edema on CXR with possible superimposed atalectasis/ pneumonia, K 3.8, CO2 25, BUN 66, Cr 10.56, WBC ct 14.7, Hgb 11.4, Plts 271, INR 2.9, troponin 0.04, and EKG sinus tach, LBBB, some J-point elevation.  Treated with nitropaste, IV Lasix, blood cultures drawn, vanc/ zosyn, BiPaP --> now [place] O2.  In this setting we are asked to see.    Review of OP record shows that pt has been attending HD sessions.  Signed off 30 min early Thursday.  EDW 84 kg, left Saturday at 84.8 kg.  Notes that from Sat--> Tues he consistently feels SOB.  He has historically had an issue with fluid restrictions.    Dialyzes at [facility] TTS 4 hrs EDW 84 kg HD Bath 2K/ 2.0 Ca, Dialyzer F180, Heparin 5800 u bolus. Access RUE AVF with buttonholes. Calcitriol 0.5 mcg q rx   Past Medical History:  Diagnosis Date  . Acute on chronic systolic and diastolic heart failure, NYHA class 4 ([place])   . Anemia, iron deficiency 11/15/2011  . Arthritis    "hands, right knee, feet" (02/21/2018)  . Atrial fibrillation ([place])   . Cancer [facility])    hx of prostate; s/p radioactive seed implant 10/2009 Dr Janice Norrie  . Cardiac arrest ([place]) 02/17/2018  . CHF (congestive heart failure) ([place])   . Chronic lower back pain   . CKD (chronic kidney disease) stage V requiring chronic dialysis ([place])   . ESRD on dialysis ([place])    started 02/2018  . GERD (gastroesophageal reflux disease)   . Glaucoma   . Gout    daily RX (02/21/2018)  . Heart murmur    "mild" per pt  . Hepatitis    years ago  . History of  cardiac cath 2004   negative for CAD  . History of myocardial perfusion scan 02/2010   negative for coronary insufficiency (LVEF 27%)  . HIV (human immunodeficiency virus infection) ([place])   . HIV infection ([place])   . Hyperlipidemia   . Hypertension    followed by [facility] Vascular (Dr Dani Gobble Croitoru)  . Hypertension   . Nonischemic cardiomyopathy ([place])   . Pneumonia 11/2017  . Prostate cancer ([place])   . Sinus bradycardia   . Sleep apnea    does not use a cpap  . Stroke [facility])    "mini stroke" years ago  . Systolic and diastolic CHF, acute ([place]) 06/2010   felt to be secondary to hypertensive cardiomyopathy    Past Surgical History:  Procedure Laterality Date  . AV FISTULA PLACEMENT Right 10/13/2016   Procedure: ARTERIOVENOUS (AV) FISTULA CREATION;  Surgeon: [name], MD;  Location: [place];  Service: Vascular;  Laterality: Right;  . AV FISTULA PLACEMENT Right 10/13/2016   [name] [phone]  . AV FISTULA PLACEMENT Left 02/25/2018   Procedure: INSERTION OF ARTERIOVENOUS (AV) GORE-TEX GRAFT LEFT UPPER ARM;  Surgeon: Angelia Mould, MD;  Location: [place];  Service: Vascular;  Laterality: Left;  . AV FISTULA PLACEMENT  Right 08/07/2018   Procedure: Creation of right arm brachiocephalic Fistula;  Surgeon: Waynetta Sandy, MD;  Location: [place];  Service: Vascular;  Laterality: Right;  . BASCILIC VEIN TRANSPOSITION Left 06/24/2015   Procedure: BASILIC VEIN TRANSPOSITION;  Surgeon: Mal Misty, MD;  Location: [place];  Service: Vascular;  Laterality: Left;  . BASCILIC VEIN TRANSPOSITION Left 08/20/9149 Bascilic vein transposition (Left)   [name] [phone]  . CARDIAC CATHETERIZATION  02/04/2003   mildly depressed LV systolic fx EF 65%,VVZSMO coronaries/abdominal aorta/renal arteries.  Marland Kitchen CARDIAC CATHETERIZATION  2004  . CATARACT EXTRACTION, BILATERAL Bilateral   . COLONOSCOPY    . ESOPHAGOGASTRODUODENOSCOPY (EGD) WITH PROPOFOL N/A 05/05/2014   Procedure:  ESOPHAGOGASTRODUODENOSCOPY (EGD) WITH PROPOFOL;  Surgeon: Missy Sabins, MD;  Location: [facility];  Service: Endoscopy;  Laterality: N/A;  . EYE SURGERY Bilateral    cataract surgery   . EYE SURGERY Bilateral    glaucoma surgery  . EYE SURGERY    . GLAUCOMA SURGERY Bilateral   . INSERTION OF ARTERIOVENOUS (AV) ARTEGRAFT ARM  10/09/2018   Procedure: INSERTION OF ARTERIOVENOUS (AV) 22mm x 41cm ARTEGRAFT LEFT UPPER ARM;  Surgeon: Waynetta Sandy, MD;  Location: [place];  Service: Vascular;;  . INSERTION PROSTATE RADIATION SEED    . IR FLUORO GUIDE CV LINE RIGHT  02/18/2018  . IR REMOVAL TUN CV CATH W/O FL  12/06/2018  . IR US GUIDE VASC ACCESS RIGHT  02/18/2018  . LEFT HEART CATH AND CORONARY ANGIOGRAPHY N/A 02/20/2018   Procedure: LEFT HEART CATH AND CORONARY ANGIOGRAPHY;  Surgeon: Jettie Booze, MD;  Location: [place] CV LAB;  Service: Cardiovascular;  Laterality: N/A;  . NM MYOCAR PERF WALL MOTION  02/21/2010   normal  . RADIOACTIVE SEED IMPLANT  2010   prostate cancer  . THROMBECTOMY W/ EMBOLECTOMY Left 02/26/2018   Procedure: THROMBECTOMY ARTERIOVENOUS GRAFT;  Surgeon: Angelia Mould, MD;  Location: [place];  Service: Vascular;  Laterality: Left;  . ULTRASOUND GUIDANCE FOR VASCULAR ACCESS  02/20/2018   Procedure: Ultrasound Guidance For Vascular Access;  Surgeon: Jettie Booze, MD;  Location: [place] CV LAB;  Service: Cardiovascular;;  . UPPER EXTREMITY VENOGRAPHY N/A 07/08/2018   Procedure: UPPER EXTREMITY VENOGRAPHY;  Surgeon: Waynetta Sandy, MD;  Location: [place] CV LAB;  Service: Cardiovascular;  Laterality: N/A;  Bilateral  . US ECHOCARDIOGRAPHY  02/06/2012   mild LVH,LA mod. dilated,mild-mod. 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 AI.    Family History  Problem Relation Age of Onset  . Hypertension Mother   . Thyroid disease Mother   . Cholelithiasis Daughter   . Cholelithiasis Son   . Hypertension Maternal  Grandmother   . Diabetes Maternal Grandmother   . Heart attack Neg Hx   . Hyperlipidemia Neg Hx     Social History:  reports that he quit smoking about 14 years ago. His smoking use included cigarettes. He has a 30.00 pack-year smoking history. He has never used smokeless tobacco. He reports current alcohol use. He reports that he does not use drugs.  Allergies:  Allergies  Allergen Reactions  . Cozaar [Losartan Potassium] Other (See Comments)    Causes constipation     Medications:  Scheduled: . amiodarone  200 mg Oral Daily  . bictegravir-emtricitabine-tenofovir AF  1 tablet Oral Daily  . Chlorhexidine Gluconate Cloth  6 each Topical Q0600  . sodium chloride flush  3 mL Intravenous Q12H  . sodium chloride flush  3 mL  Intravenous Q12H  . warfarin  10 mg Oral q1800  . Warfarin - Pharmacist Dosing Inpatient   Does not apply q1800     Results for orders placed or performed during the hospital encounter of 02/10/19 (from the past 48 hour(s))  CBC with Differential/Platelet     Status: Abnormal   Collection Time: 02/11/19 12:15 AM  Result Value Ref Range   WBC 14.7 (H) 4.0 - 10.5 K/uL   RBC 3.20 (L) 4.22 - 5.81 MIL/uL   Hemoglobin 11.4 (L) 13.0 - 17.0 g/dL   HCT 34.8 (L) 39.0 - 52.0 %   MCV 108.8 (H) 80.0 - 100.0 fL   MCH 35.6 (H) 26.0 - 34.0 pg   MCHC 32.8 30.0 - 36.0 g/dL   RDW 15.6 (H) 11.5 - 15.5 %   Platelets 271 150 - 400 K/uL   nRBC 0.0 0.0 - 0.2 %   Neutrophils Relative % 76 %   Neutro Abs 11.1 (H) 1.7 - 7.7 K/uL   Lymphocytes Relative 15 %   Lymphs Abs 2.3 0.7 - 4.0 K/uL   Monocytes Relative 5 %   Monocytes Absolute 0.8 0.1 - 1.0 K/uL   Eosinophils Relative 2 %   Eosinophils Absolute 0.4 0.0 - 0.5 K/uL   Basophils Relative 1 %   Basophils Absolute 0.1 0.0 - 0.1 K/uL   Immature Granulocytes 1 %   Abs Immature Granulocytes 0.10 (H) 0.00 - 0.07 K/uL    Comment: Performed at [facility], [address], [place], [place] [address]  Basic metabolic panel      Status: Abnormal   Collection Time: 02/11/19 12:15 AM  Result Value Ref Range   Sodium 137 135 - 145 mmol/L   Potassium 3.8 3.5 - 5.1 mmol/L   Chloride 94 (L) 98 - 111 mmol/L   CO2 25 22 - 32 mmol/L   Glucose, Bld 175 (H) 70 - 99 mg/dL   BUN 66 (H) 8 - 23 mg/dL   Creatinine, Ser 10.56 (H) 0.61 - 1.24 mg/dL   Calcium 8.9 8.9 - 10.3 mg/dL   GFR calc non Af Amer 4 (L) >60 mL/min   GFR calc Af Amer 5 (L) >60 mL/min   Anion gap 18 (H) 5 - 15    Comment: Performed at [facility] [address] [place], [place] [address]  Brain natriuretic peptide     Status: Abnormal   Collection Time: 02/11/19 12:15 AM  Result Value Ref Range   B Natriuretic Peptide 1,054.2 (H) 0.0 - 100.0 pg/mL    Comment: Performed at [facility] [address] [place], [place] [address]  Protime-INR     Status: Abnormal   Collection Time: 02/11/19 12:18 AM  Result Value Ref Range   Prothrombin Time 29.5 (H) 11.4 - 15.2 seconds   INR 2.9 (H) 0.8 - 1.2    Comment: (NOTE) INR goal varies based on device and disease states. Performed at [facility], [place] [address], [place], [place] [address]   I-stat troponin, ED     Status: None   Collection Time: 02/11/19 12:33 AM  Result Value Ref Range   Troponin i, poc 0.04 0.00 - 0.08 ng/mL   Comment 3            Comment: Due to the release kinetics of cTnI, a negative result within the first hours of the onset of symptoms does not rule out myocardial infarction with certainty. If myocardial infarction is still  suspected, repeat the test at appropriate intervals.   Basic metabolic panel     Status: Abnormal   Collection Time: 02/11/19  3:37 AM  Result Value Ref Range   Sodium 135 135 - 145 mmol/L   Potassium 5.1 3.5 - 5.1 mmol/L   Chloride 94 (L) 98 - 111 mmol/L   CO2 25 22 - 32 mmol/L   Glucose, Bld 108 (H) 70 - 99 mg/dL   BUN 71 (H) 8 - 23 mg/dL   Creatinine, Ser 10.82 (H) 0.61 - 1.24 mg/dL   Calcium 8.9 8.9 - 10.3 mg/dL   GFR calc non  Af Amer 4 (L) >60 mL/min   GFR calc Af Amer 5 (L) >60 mL/min   Anion gap 16 (H) 5 - 15    Comment: Performed at [facility] [address] [place], [place] [address]  CBC WITH DIFFERENTIAL     Status: Abnormal   Collection Time: 02/11/19  3:37 AM  Result Value Ref Range   WBC 10.5 4.0 - 10.5 K/uL   RBC 2.97 (L) 4.22 - 5.81 MIL/uL   Hemoglobin 10.3 (L) 13.0 - 17.0 g/dL   HCT 32.2 (L) 39.0 - 52.0 %   MCV 108.4 (H) 80.0 - 100.0 fL   MCH 34.7 (H) 26.0 - 34.0 pg   MCHC 32.0 30.0 - 36.0 g/dL   RDW 15.5 11.5 - 15.5 %   Platelets 262 150 - 400 K/uL   nRBC 0.0 0.0 - 0.2 %   Neutrophils Relative % 80 %   Neutro Abs 8.5 (H) 1.7 - 7.7 K/uL   Lymphocytes Relative 12 %   Lymphs Abs 1.2 0.7 - 4.0 K/uL   Monocytes Relative 6 %   Monocytes Absolute 0.6 0.1 - 1.0 K/uL   Eosinophils Relative 1 %   Eosinophils Absolute 0.1 0.0 - 0.5 K/uL   Basophils Relative 1 %   Basophils Absolute 0.1 0.0 - 0.1 K/uL   Immature Granulocytes 0 %   Abs Immature Granulocytes 0.04 0.00 - 0.07 K/uL    Comment: Performed at [facility], [address], [place], [place] [address]    Dg Chest Portable 1 View  Result Date: 02/10/2019 CLINICAL DATA:  Shortness of breath EXAM: PORTABLE CHEST 1 VIEW COMPARISON:  12/04/2018, 04/30/2018 FINDINGS: Cardiomegaly with vascular congestion and diffuse bilateral interstitial and ground-glass opacity suspicious for pulmonary edema. Patchy consolidation at both lung bases. No pneumothorax. IMPRESSION: Cardiomegaly with vascular congestion and diffuse interstitial and ground-glass opacities suspicious for pulmonary edema. Confluent airspace disease at the lung bases may reflect atelectasis or superimposed pneumonia Electronically Signed   By: Donavan Foil M.D.   On: 02/10/2019 23:53    ROS: all other systems reviewed and are negative except as per HPI Blood pressure 125/81, pulse 84, temperature (!) 96.5 F (35.8 C), temperature source Temporal, resp. rate (!) 21, height  6\' 2"  (1.88 m), weight 86.7 kg, SpO2 100 %. .  GEN: sl short of breath, sitting on bed, talking in 3-4 word sentences HEENT EOMI, PERRL, MMM NECK + JVD PULM diffuse crackles bilaterally 1/2 way up lung fields CV RRR no m/r/g ABD soft, sl distended, NABS EXT trace LE edema, some dependent edema flanks and back NEURO AAO x 3 nonfocal SKIN no rashes/ lesions ACCESS: LUE AVF + T/B, buttonholes look excellent  Assessment/Plan: 1 SOB/ Resp distress: looks like pulm edema +/- superimposed PNA.  Treated with IV abx (vanc/ Zosyn). Blood cultures pending, still has nitropaste on (may  need to pull off during HD if BP drops).  Per primary.  Urgent HD today--> likely will need a lower EDW at discharge as well.  Discussed fluid restrictions. 2 ESRD: TTS [place].  HD today on regular schedule, may need an extra rx  3 Blood pressure: BP is fairly well-controlled despite pulm edema.  Takes midodrine 10 mg BID 4. 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 Afib- on warfarin 7.  HIV- on ART, per primary 8.  Gout- on allopurinol 9.  Dispo: pending    Madelon Lips 02/11/2019, 7:35 AM

## 2019-02-11 NOTE — Progress Notes (Signed)
Patient refused bipap for the evening

## 2019-02-11 NOTE — ED Notes (Signed)
ED TO INPATIENT HANDOFF REPORT  ED Nurse Name and Phone #:  Jenny Reichmann [place] Name/Age/Gender [facility] 78 y.o. male Room/Bed: OTFC/OTF  Code Status   Code Status: Full Code  Home/SNF/Other Home Patient oriented to: self, place, time and situation Is this baseline? Yes   Triage Complete: Triage complete  Chief Complaint Cpap  Triage Note Patient with sudden onset of shortness of breath.  Patient denies any chest pain at this time.  Patient is a dialysis patient, is compliant with dialysis.  He goes on Tu,TH, Sat.  Patient is mildly diaphoretic, resp rate in 30s.    Allergies Allergies  Allergen Reactions  . Cozaar [Losartan Potassium] Other (See Comments)    Causes constipation     Level of Care/Admitting Diagnosis ED Disposition    ED Disposition Condition [facility] Area: [place] [100100]  Level of Care: Progressive [102]  I expect the patient will be discharged within 24 hours: Yes  LOW acuity---Tx typically complete <24 hrs---ACUTE conditions typically can be evaluated <24 hours---LABS likely to return to acceptable levels <24 hours---IS near functional baseline---EXPECTED to return to current living arrangement---NOT newly hypoxic: Does not meet criteria for 5C-Observation unit  Diagnosis: Acute pulmonary edema [facility]) [599357]  Admitting Physician: Vianne Bulls [[id number]]  Attending Physician: Vianne Bulls [[id number]]  PT Class (Do Not Modify): Observation [104]  PT Acc Code (Do Not Modify): Observation [10022]       B Medical/Surgery History Past Medical History:  Diagnosis Date  . Acute on chronic systolic and diastolic heart failure, NYHA class 4 ([place])   . Anemia, iron deficiency 11/15/2011  . Arthritis    "hands, right knee, feet" (02/21/2018)  . Atrial fibrillation ([place])   . Cancer [facility])    hx of prostate; s/p radioactive seed implant 10/2009 Dr Janice Norrie  . Cardiac arrest ([place]) 02/17/2018  . CHF  (congestive heart failure) ([place])   . Chronic lower back pain   . CKD (chronic kidney disease) stage V requiring chronic dialysis ([place])   . ESRD on dialysis ([place])    started 02/2018  . GERD (gastroesophageal reflux disease)   . Glaucoma   . Gout    daily RX (02/21/2018)  . Heart murmur    "mild" per pt  . Hepatitis    years ago  . History of cardiac cath 2004   negative for CAD  . History of myocardial perfusion scan 02/2010   negative for coronary insufficiency (LVEF 27%)  . HIV (human immunodeficiency virus infection) ([place])   . HIV infection ([place])   . Hyperlipidemia   . Hypertension    followed by [facility] (Dr Dani Gobble Croitoru)  . Hypertension   . Nonischemic cardiomyopathy ([place])   . Pneumonia 11/2017  . Prostate cancer ([place])   . Sinus bradycardia   . Sleep apnea    does not use a cpap  . Stroke [facility])    "mini stroke" years ago  . Systolic and diastolic CHF, acute ([place]) 06/2010   felt to be secondary to hypertensive cardiomyopathy   Past Surgical History:  Procedure Laterality Date  . AV FISTULA PLACEMENT Right 10/13/2016   Procedure: ARTERIOVENOUS (AV) FISTULA CREATION;  Surgeon: [name], MD;  Location: [place];  Service: Vascular;  Laterality: Right;  . AV FISTULA PLACEMENT Right 10/13/2016   [name] [phone]  . AV FISTULA PLACEMENT Left 02/25/2018   Procedure: INSERTION OF ARTERIOVENOUS (AV) GORE-TEX GRAFT LEFT UPPER  ARM;  Surgeon: Angelia Mould, MD;  Location: [facility] OR;  Service: Vascular;  Laterality: Left;  . AV FISTULA PLACEMENT Right 08/07/2018   Procedure: Creation of right arm brachiocephalic Fistula;  Surgeon: Waynetta Sandy, MD;  Location: [place];  Service: Vascular;  Laterality: Right;  . BASCILIC VEIN TRANSPOSITION Left 06/24/2015   Procedure: BASILIC VEIN TRANSPOSITION;  Surgeon: Mal Misty, MD;  Location: [place];  Service: Vascular;  Laterality: Left;  . BASCILIC VEIN TRANSPOSITION Left 2/84/1324 Bascilic vein  transposition (Left)   [name] [phone]  . CARDIAC CATHETERIZATION  02/04/2003   mildly depressed LV systolic fx EF 66%,[id number] coronaries/abdominal aorta/renal arteries.  Marland Kitchen CARDIAC CATHETERIZATION  2004  . CATARACT EXTRACTION, BILATERAL Bilateral   . COLONOSCOPY    . ESOPHAGOGASTRODUODENOSCOPY (EGD) WITH PROPOFOL N/A 05/05/2014   Procedure: ESOPHAGOGASTRODUODENOSCOPY (EGD) WITH PROPOFOL;  Surgeon: Missy Sabins, MD;  Location: [facility];  Service: Endoscopy;  Laterality: N/A;  . EYE SURGERY Bilateral    cataract surgery   . EYE SURGERY Bilateral    glaucoma surgery  . EYE SURGERY    . GLAUCOMA SURGERY Bilateral   . INSERTION OF ARTERIOVENOUS (AV) ARTEGRAFT ARM  10/09/2018   Procedure: INSERTION OF ARTERIOVENOUS (AV) 86mm x 41cm ARTEGRAFT LEFT UPPER ARM;  Surgeon: Waynetta Sandy, MD;  Location: [place];  Service: Vascular;;  . INSERTION PROSTATE RADIATION SEED    . IR FLUORO GUIDE CV LINE RIGHT  02/18/2018  . IR REMOVAL TUN CV CATH W/O FL  12/06/2018  . IR US GUIDE VASC ACCESS RIGHT  02/18/2018  . LEFT HEART CATH AND CORONARY ANGIOGRAPHY N/A 02/20/2018   Procedure: LEFT HEART CATH AND CORONARY ANGIOGRAPHY;  Surgeon: Jettie Booze, MD;  Location: [place] CV LAB;  Service: Cardiovascular;  Laterality: N/A;  . NM MYOCAR PERF WALL MOTION  02/21/2010   normal  . RADIOACTIVE SEED IMPLANT  2010   prostate cancer  . THROMBECTOMY W/ EMBOLECTOMY Left 02/26/2018   Procedure: THROMBECTOMY ARTERIOVENOUS GRAFT;  Surgeon: Angelia Mould, MD;  Location: [place];  Service: Vascular;  Laterality: Left;  . ULTRASOUND GUIDANCE FOR VASCULAR ACCESS  02/20/2018   Procedure: Ultrasound Guidance For Vascular Access;  Surgeon: Jettie Booze, MD;  Location: [place] CV LAB;  Service: Cardiovascular;;  . UPPER EXTREMITY VENOGRAPHY N/A 07/08/2018   Procedure: UPPER EXTREMITY VENOGRAPHY;  Surgeon: Waynetta Sandy, MD;  Location: [place] CV LAB;  Service: Cardiovascular;   Laterality: N/A;  Bilateral  . US ECHOCARDIOGRAPHY  02/06/2012   mild LVH,LA mod. dilated,mild-mod. 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 AI.     A IV Location/Drains/Wounds Patient Lines/Drains/Airways Status   Active Line/Drains/Airways    Name:   Placement date:   Placement time:   Site:   Days:   Peripheral IV 02/11/19 Left;Lateral Wrist   02/11/19    0016    Wrist   less than 1   Fistula / Graft Left Upper arm Arteriovenous fistula   06/24/15    0912    Upper arm   1328   Fistula / Graft Right Upper arm Arteriovenous fistula   10/13/16    0949    Upper arm   851   Fistula / Graft Left Upper arm Arteriovenous vein graft   02/25/18    0845    Upper arm   351   Fistula / Graft Right Forearm Arteriovenous fistula   08/07/18    0911    Forearm  188   Fistula / Graft Right Upper arm Arteriovenous vein graft   10/09/18    0917    Upper arm   125   Incision (Closed) 06/24/15 Arm Left   06/24/15    0726     1328   Incision (Closed) 06/24/15 Arm Left   06/24/15    0944     1328   Incision (Closed) 06/24/15 Arm Left   06/24/15    0944     1328   Incision (Closed) 10/13/16 Arm Right   10/13/16    0953     851   Incision (Closed) 02/25/18 Arm Left   02/25/18    0758     351   Incision (Closed) 02/26/18 Arm Left   02/26/18    1458     350   Incision (Closed) 08/07/18 Arm Right   08/07/18    0912     188   Incision (Closed) 10/09/18 Arm Right   10/09/18    0851     125          Intake/Output Last 24 hours No intake or output data in the 24 hours ending 02/11/19 0729  Labs/Imaging Results for orders placed or performed during the hospital encounter of 02/10/19 (from the past 48 hour(s))  CBC with Differential/Platelet     Status: Abnormal   Collection Time: 02/11/19 12:15 AM  Result Value Ref Range   WBC 14.7 (H) 4.0 - 10.5 K/uL   RBC 3.20 (L) 4.22 - 5.81 MIL/uL   Hemoglobin 11.4 (L) 13.0 - 17.0 g/dL   HCT 34.8 (L) 39.0 - 52.0 %   MCV 108.8 (H) 80.0 - 100.0 fL    MCH 35.6 (H) 26.0 - 34.0 pg   MCHC 32.8 30.0 - 36.0 g/dL   RDW 15.6 (H) 11.5 - 15.5 %   Platelets 271 150 - 400 K/uL   nRBC 0.0 0.0 - 0.2 %   Neutrophils Relative % 76 %   Neutro Abs 11.1 (H) 1.7 - 7.7 K/uL   Lymphocytes Relative 15 %   Lymphs Abs 2.3 0.7 - 4.0 K/uL   Monocytes Relative 5 %   Monocytes Absolute 0.8 0.1 - 1.0 K/uL   Eosinophils Relative 2 %   Eosinophils Absolute 0.4 0.0 - 0.5 K/uL   Basophils Relative 1 %   Basophils Absolute 0.1 0.0 - 0.1 K/uL   Immature Granulocytes 1 %   Abs Immature Granulocytes 0.10 (H) 0.00 - 0.07 K/uL    Comment: Performed at [facility], [address], [place], [place] [address]  Basic metabolic panel     Status: Abnormal   Collection Time: 02/11/19 12:15 AM  Result Value Ref Range   Sodium 137 135 - 145 mmol/L   Potassium 3.8 3.5 - 5.1 mmol/L   Chloride 94 (L) 98 - 111 mmol/L   CO2 25 22 - 32 mmol/L   Glucose, Bld 175 (H) 70 - 99 mg/dL   BUN 66 (H) 8 - 23 mg/dL   Creatinine, Ser 10.56 (H) 0.61 - 1.24 mg/dL   Calcium 8.9 8.9 - 10.3 mg/dL   GFR calc non Af Amer 4 (L) >60 mL/min   GFR calc Af Amer 5 (L) >60 mL/min   Anion gap 18 (H) 5 - 15    Comment: Performed at [facility] [address], [place], [place] [address]  Brain natriuretic peptide     Status: Abnormal   Collection Time: 02/11/19 12:15 AM  Result Value Ref Range   B Natriuretic Peptide 1,054.2 (H) 0.0 - 100.0 pg/mL    Comment: Performed at [facility] [address], [place], [place] [address]  Protime-INR     Status: Abnormal   Collection Time: 02/11/19 12:18 AM  Result Value Ref Range   Prothrombin Time 29.5 (H) 11.4 - 15.2 seconds   INR 2.9 (H) 0.8 - 1.2    Comment: (NOTE) INR goal varies based on device and disease states. Performed at [facility], [place] [address], [place], [place] [address]   I-stat troponin, ED     Status: None   Collection Time: 02/11/19 12:33 AM  Result Value Ref Range   Troponin i, poc 0.04 0.00 - 0.08  ng/mL   Comment 3            Comment: Due to the release kinetics of cTnI, a negative result within the first hours of the onset of symptoms does not rule out myocardial infarction with certainty. If myocardial infarction is still suspected, repeat the test at appropriate intervals.   Basic metabolic panel     Status: Abnormal   Collection Time: 02/11/19  3:37 AM  Result Value Ref Range   Sodium 135 135 - 145 mmol/L   Potassium 5.1 3.5 - 5.1 mmol/L   Chloride 94 (L) 98 - 111 mmol/L   CO2 25 22 - 32 mmol/L   Glucose, Bld 108 (H) 70 - 99 mg/dL   BUN 71 (H) 8 - 23 mg/dL   Creatinine, Ser 10.82 (H) 0.61 - 1.24 mg/dL   Calcium 8.9 8.9 - 10.3 mg/dL   GFR calc non Af Amer 4 (L) >60 mL/min   GFR calc Af Amer 5 (L) >60 mL/min   Anion gap 16 (H) 5 - 15    Comment: Performed at [facility] [address], [place], [place] [address]  CBC WITH DIFFERENTIAL     Status: Abnormal   Collection Time: 02/11/19  3:37 AM  Result Value Ref Range   WBC 10.5 4.0 - 10.5 K/uL   RBC 2.97 (L) 4.22 - 5.81 MIL/uL   Hemoglobin 10.3 (L) 13.0 - 17.0 g/dL   HCT 32.2 (L) 39.0 - 52.0 %   MCV 108.4 (H) 80.0 - 100.0 fL   MCH 34.7 (H) 26.0 - 34.0 pg   MCHC 32.0 30.0 - 36.0 g/dL   RDW 15.5 11.5 - 15.5 %   Platelets 262 150 - 400 K/uL   nRBC 0.0 0.0 - 0.2 %   Neutrophils Relative % 80 %   Neutro Abs 8.5 (H) 1.7 - 7.7 K/uL   Lymphocytes Relative 12 %   Lymphs Abs 1.2 0.7 - 4.0 K/uL   Monocytes Relative 6 %   Monocytes Absolute 0.6 0.1 - 1.0 K/uL   Eosinophils Relative 1 %   Eosinophils Absolute 0.1 0.0 - 0.5 K/uL   Basophils Relative 1 %   Basophils Absolute 0.1 0.0 - 0.1 K/uL   Immature Granulocytes 0 %   Abs Immature Granulocytes 0.04 0.00 - 0.07 K/uL    Comment: Performed at [facility], [address], [place], [place] [address]   Dg Chest Portable 1 View  Result Date: 02/10/2019 CLINICAL DATA:  Shortness of breath EXAM: PORTABLE CHEST 1 VIEW COMPARISON:  12/04/2018, 04/30/2018 FINDINGS:  Cardiomegaly with vascular congestion and diffuse bilateral interstitial and ground-glass opacity suspicious for pulmonary edema. Patchy consolidation at both lung bases. No pneumothorax. IMPRESSION: Cardiomegaly with vascular  congestion and diffuse interstitial and ground-glass opacities suspicious for pulmonary edema. Confluent airspace disease at the lung bases may reflect atelectasis or superimposed pneumonia Electronically Signed   By: Donavan Foil M.D.   On: 02/10/2019 23:53    Pending Labs Unresulted Labs (From admission, onward)    Start     Ordered   02/12/19 0500  Protime-INR  Daily,   R     02/11/19 0235   02/11/19 0201  Culture, sputum-assessment  Once,   R    Question:  Patient immune status  Answer:  Immunocompromised   02/11/19 0203   02/11/19 0201  Gram stain  Once,   R    Question:  Patient immune status  Answer:  Immunocompromised   02/11/19 0203   02/11/19 0201  Strep pneumoniae urinary antigen  Once,   R     02/11/19 0203   02/11/19 0015  Blood culture (routine x 2)  BLOOD CULTURE X 2,   STAT     02/11/19 0015          Vitals/Pain Today's Vitals   02/11/19 0400 02/11/19 0500 02/11/19 0600 02/11/19 0700  BP: 126/77 137/86 123/80 125/81  Pulse: 85 80 85 84  Resp: (!) 22 17 (!) 22 (!) 21  Temp:      TempSrc:      SpO2: 100% 99% 99% 100%  Weight:      Height:      PainSc:        Isolation Precautions No active isolations  Medications Medications  bictegravir-emtricitabine-tenofovir AF ([name]) 50-200-25 MG per tablet 1 tablet (has no administration in time range)  sodium chloride flush (NS) 0.9 % injection 3 mL (3 mLs Intravenous Given 02/11/19 0225)  sodium chloride flush (NS) 0.9 % injection 3 mL (3 mLs Intravenous Not Given 02/11/19 0226)  sodium chloride flush (NS) 0.9 % injection 3 mL (has no administration in time range)  0.9 %  sodium chloride infusion (has no administration in time range)  acetaminophen (TYLENOL) tablet 650 mg (has no  administration in time range)    Or  acetaminophen (TYLENOL) suppository 650 mg (has no administration in time range)  HYDROcodone-acetaminophen (NORCO/VICODIN) 5-325 MG per tablet 1 tablet (has no administration in time range)  ondansetron (ZOFRAN) tablet 4 mg (has no administration in time range)    Or  ondansetron (ZOFRAN) injection 4 mg (has no administration in time range)  ceFEPIme (MAXIPIME) 1 g in sodium chloride 0.9 % 100 mL IVPB (has no administration in time range)  amiodarone (PACERONE) tablet 200 mg (has no administration in time range)  Chlorhexidine Gluconate Cloth 2 % PADS 6 each (6 each Topical Not Given 02/11/19 0615)  vancomycin (VANCOCIN) IVPB 1000 mg/200 mL premix (has no administration in time range)  warfarin (COUMADIN) tablet 10 mg (has no administration in time range)  Warfarin - Pharmacist Dosing Inpatient (has no administration in time range)  nitroGLYCERIN (NITROGLYN) 2 % ointment 1 inch (1 inch Topical Given 02/11/19 0057)  furosemide (LASIX) injection 40 mg (40 mg Intravenous Given 02/11/19 0053)  vancomycin (VANCOCIN) IVPB 1000 mg/200 mL premix (0 mg Intravenous Stopped 02/11/19 0204)  piperacillin-tazobactam (ZOSYN) IVPB 3.375 g (0 g Intravenous Stopped 02/11/19 0148)  vancomycin (VANCOCIN) IVPB 1000 mg/200 mL premix (0 mg Intravenous Stopped 02/11/19 0509)    Mobility walks Low fall risk   Focused Assessments Cardiac Assessment Handoff:  Cardiac Rhythm: Sinus tachycardia Lab Results  Component Value Date   TROPONINI 0.30 ([place]) 12/05/2018   Lab  Results  Component Value Date   DDIMER 1.56 (H) 05/12/2014   Does the Patient currently have chest pain? No     R Recommendations: See Admitting Provider Note  Report given to:   Additional Notes:   On bipap for 2 hours, went to [place].  Patient could possible be downgraded to tele bed from progressive

## 2019-02-11 NOTE — Progress Notes (Signed)
ANTICOAGULATION CONSULT NOTE - Initial Consult  Pharmacy Consult for Warfarin  Indication: atrial fibrillation  Allergies  Allergen Reactions  . Cozaar [Losartan Potassium] Other (See Comments)    Causes constipation     Patient Measurements: Height: 6\' 2"  (188 cm) Weight: 191 lb 2.2 oz (86.7 kg) IBW/kg (Calculated) : 82.2  Vital Signs: Temp: 96.5 F (35.8 C) (03/09 2350) Temp Source: Temporal (03/09 2350) BP: 128/73 (03/10 0200) Pulse Rate: 88 (03/10 0200)  Labs: Recent Labs    02/11/19 0015 02/11/19 0018  HGB 11.4*  --   HCT 34.8*  --   PLT 271  --   LABPROT  --  29.5*  INR  --  2.9*  CREATININE 10.56*  --     Estimated Creatinine Clearance: 6.9 mL/min (A) (by C-G formula based on SCr of 10.56 mg/dL (H)).   Medical History: Past Medical History:  Diagnosis Date  . Acute on chronic systolic and diastolic heart failure, NYHA class 4 ([place])   . Anemia, iron deficiency 11/15/2011  . Arthritis    "hands, right knee, feet" (02/21/2018)  . Atrial fibrillation ([place])   . Cancer [facility])    hx of prostate; s/p radioactive seed implant 10/2009 Dr Janice Norrie  . Cardiac arrest ([place]) 02/17/2018  . CHF (congestive heart failure) ([place])   . Chronic lower back pain   . CKD (chronic kidney disease) stage V requiring chronic dialysis ([place])   . ESRD on dialysis ([place])    started 02/2018  . GERD (gastroesophageal reflux disease)   . Glaucoma   . Gout    daily RX (02/21/2018)  . Heart murmur    "mild" per pt  . Hepatitis    years ago  . History of cardiac cath 2004   negative for CAD  . History of myocardial perfusion scan 02/2010   negative for coronary insufficiency (LVEF 27%)  . HIV (human immunodeficiency virus infection) ([place])   . HIV infection ([place])   . Hyperlipidemia   . Hypertension    followed by [facility] (Dr Dani Gobble Croitoru)  . Hypertension   . Nonischemic cardiomyopathy ([place])   . Pneumonia 11/2017  . Prostate cancer ([place])   . Sinus bradycardia    . Sleep apnea    does not use a cpap  . Stroke [facility])    "mini stroke" years ago  . Systolic and diastolic CHF, acute ([place]) 06/2010   felt to be secondary to hypertensive cardiomyopathy    Assessment: 78 y/o M here with shortness of breath, on warfarin PTA for afib, INR is therapeutic at 2.9, Hgb 11.4  Warfarin PTA dosing: 10 mg daily   Goal of Therapy:  INR 2-3 Monitor platelets by anticoagulation protocol: Yes   Plan:  Cont Warfarin 10 mg daily at 1800 Daily PT/INR Monitor for bleeding   Narda Bonds 02/11/2019,2:30 AM

## 2019-02-11 NOTE — ED Provider Notes (Signed)
[facility] EMERGENCY DEPARTMENT Provider Note   CSN: [id number] Arrival date & time: 02/10/19  2343    History   Chief Complaint Chief Complaint  Patient presents with  . Shortness of Breath    HPI David [name] is a 78 y.o. male.     The history is provided by the EMS personnel. The history is limited by the condition of the patient.  Shortness of Breath  Severity:  Severe Onset quality:  Sudden Duration:  30 minutes Timing:  Constant Progression:  Unchanged Chronicity:  Recurrent Context: not activity and not weather changes   Relieved by:  Nothing Worsened by:  Nothing Ineffective treatments:  None tried Associated symptoms: no abdominal pain, no fever, no rash, no sputum production, no syncope, no swollen glands and no vomiting   Risk factors: no prolonged immobilization     Past Medical History:  Diagnosis Date  . Acute on chronic systolic and diastolic heart failure, NYHA class 4 ([place])   . Anemia, iron deficiency 11/15/2011  . Arthritis    "hands, right knee, feet" (02/21/2018)  . Atrial fibrillation ([place])   . Cancer [facility])    hx of prostate; s/p radioactive seed implant 10/2009 Dr Janice Norrie  . Cardiac arrest ([place]) 02/17/2018  . CHF (congestive heart failure) ([place])   . Chronic lower back pain   . CKD (chronic kidney disease) stage V requiring chronic dialysis ([place])   . ESRD on dialysis ([place])    started 02/2018  . GERD (gastroesophageal reflux disease)   . Glaucoma   . Gout    daily RX (02/21/2018)  . Heart murmur    "mild" per pt  . Hepatitis    years ago  . History of cardiac cath 2004   negative for CAD  . History of myocardial perfusion scan 02/2010   negative for coronary insufficiency (LVEF 27%)  . HIV (human immunodeficiency virus infection) ([place])   . HIV infection ([place])   . Hyperlipidemia   . Hypertension    followed by [facility] (Dr Dani Gobble Croitoru)  . Hypertension   . Nonischemic cardiomyopathy  ([place])   . Pneumonia 11/2017  . Prostate cancer ([place])   . Sinus bradycardia   . Sleep apnea    does not use a cpap  . Stroke [facility])    "mini stroke" years ago  . Systolic and diastolic CHF, acute ([place]) 06/2010   felt to be secondary to hypertensive cardiomyopathy    Patient Active Problem List   Diagnosis Date Noted  . Acute pulmonary edema ([place]) 02/11/2019  . Paroxysmal atrial fibrillation ([place]) 11/16/2018  . LBBB (left bundle branch block) 11/16/2018  . Encounter for monitoring amiodarone therapy 11/16/2018  . Volume overload 11/01/2018  . Gout 10/21/2018  . Hypertensive emergency 10/21/2018  . Chronic combined systolic and diastolic heart failure ([place]) 10/21/2018  . Leukocytosis 10/21/2018  . Gastroesophageal reflux disease 08/28/2018  . Chronic anticoagulation 04/03/2018  . CAD (coronary artery disease) 04/03/2018  . Encounter for therapeutic drug monitoring 03/27/2018  . Atrial fibrillation, chronic 03/25/2018  . Palliative care by specialist   . Advance care planning   . Goals of care, counseling/discussion   . Cardiac arrest ([place]) 02/17/2018  . Respiratory arrest ([place])   . Acute on chronic systolic heart failure ([place])   . Chronic obstructive pulmonary disease ([place])   . HIV (human immunodeficiency virus infection) ([place])   . ESRD (end stage renal disease) ([place])   . Acute respiratory failure with  hypoxia ([place]) 01/21/2018  . Pneumonia   . Aspiration pneumonia of both lower lobes due to regurgitated food ([place])   . Sepsis due to pneumonia ([place]) 11/22/2017  . AIDS ([place]) 05/14/2014  . 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 Gastroparesis 05/06/2014  . Protein-calorie malnutrition, severe ([place]) 05/05/2014  . ESRD on hemodialysis ([place]) 05/02/2014  . Hypotension 05/02/2014  . Loss of weight 04/29/2014  . Conjunctivitis 04/29/2014  . Nonischemic dilated cardiomyopathy ([place]) 09/10/2013  . Low back pain 04/09/2012  . Osteoarthritis of hip 12/29/2011  . Iron deficiency anemia 06/28/2011    . Macrocytic anemia 04/02/2011  . 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 Hyperlipidemia 11/30/2010  . Transient cerebral ischemia 11/30/2010  . HYPERGLYCEMIA 11/30/2010    Past Surgical History:  Procedure Laterality Date  . AV FISTULA PLACEMENT Right 10/13/2016   Procedure: ARTERIOVENOUS (AV) FISTULA CREATION;  Surgeon: [name], MD;  Location: [place];  Service: Vascular;  Laterality: Right;  . AV FISTULA PLACEMENT Right 10/13/2016   [name] [phone]  . AV FISTULA PLACEMENT Left 02/25/2018   Procedure: INSERTION OF ARTERIOVENOUS (AV) GORE-TEX GRAFT LEFT UPPER ARM;  Surgeon: Angelia Mould, MD;  Location: [place];  Service: Vascular;  Laterality: Left;  . AV FISTULA PLACEMENT Right 08/07/2018   Procedure: Creation of right arm brachiocephalic Fistula;  Surgeon: Waynetta Sandy, MD;  Location: [place];  Service: Vascular;  Laterality: Right;  . BASCILIC VEIN TRANSPOSITION Left 06/24/2015   Procedure: BASILIC VEIN TRANSPOSITION;  Surgeon: Mal Misty, MD;  Location: [place];  Service: Vascular;  Laterality: Left;  . BASCILIC VEIN TRANSPOSITION Left 0/24/0973 Bascilic vein transposition (Left)   [name] [phone]  . CARDIAC CATHETERIZATION  02/04/2003   mildly depressed LV systolic fx EF 83%,MHDQQI coronaries/abdominal aorta/renal arteries.  Marland Kitchen CARDIAC CATHETERIZATION  2004  . CATARACT EXTRACTION, BILATERAL Bilateral   . COLONOSCOPY    . ESOPHAGOGASTRODUODENOSCOPY (EGD) WITH PROPOFOL N/A 05/05/2014   Procedure: ESOPHAGOGASTRODUODENOSCOPY (EGD) WITH PROPOFOL;  Surgeon: Missy Sabins, MD;  Location: [facility];  Service: Endoscopy;  Laterality: N/A;  . EYE SURGERY Bilateral    cataract surgery   . EYE SURGERY Bilateral    glaucoma surgery  . EYE SURGERY    . GLAUCOMA SURGERY Bilateral   . INSERTION OF ARTERIOVENOUS (AV) ARTEGRAFT ARM  10/09/2018   Procedure: INSERTION OF ARTERIOVENOUS (AV) 91mm x 41cm ARTEGRAFT LEFT UPPER ARM;  Surgeon: Waynetta Sandy,  MD;  Location: [place];  Service: Vascular;;  . INSERTION PROSTATE RADIATION SEED    . IR FLUORO GUIDE CV LINE RIGHT  02/18/2018  . IR REMOVAL TUN CV CATH W/O FL  12/06/2018  . IR US GUIDE VASC ACCESS RIGHT  02/18/2018  . LEFT HEART CATH AND CORONARY ANGIOGRAPHY N/A 02/20/2018   Procedure: LEFT HEART CATH AND CORONARY ANGIOGRAPHY;  Surgeon: Jettie Booze, MD;  Location: [facility] CV LAB;  Service: Cardiovascular;  Laterality: N/A;  . NM MYOCAR PERF WALL MOTION  02/21/2010   normal  . RADIOACTIVE SEED IMPLANT  2010   prostate cancer  . THROMBECTOMY W/ EMBOLECTOMY Left 02/26/2018   Procedure: THROMBECTOMY ARTERIOVENOUS GRAFT;  Surgeon: Angelia Mould, MD;  Location: [facility];  Service: Vascular;  Laterality: Left;  . ULTRASOUND GUIDANCE FOR VASCULAR ACCESS  02/20/2018   Procedure: Ultrasound Guidance For Vascular Access;  Surgeon: Jettie Booze, MD;  Location: [facility];  Service: Cardiovascular;;  . UPPER EXTREMITY VENOGRAPHY N/A 07/08/2018   Procedure: UPPER EXTREMITY VENOGRAPHY;  Surgeon:  Waynetta Sandy, MD;  Location: [place] CV LAB;  Service: Cardiovascular;  Laterality: N/A;  Bilateral  . US ECHOCARDIOGRAPHY  02/06/2012   mild LVH,LA mod. dilated,mild-mod. 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 AI.        Home Medications    Prior to Admission medications   Medication Sig Start Date End Date Taking? Authorizing Provider  acetaminophen (TYLENOL) 500 MG tablet Take 1,000 mg by mouth every 6 (six) hours as needed for mild pain.   Yes [provider]  allopurinol (ZYLOPRIM) 100 MG tablet TAKE 1 TABLET EVERY DAY Patient taking differently: Take 100 mg by mouth daily.  12/23/18  Yes Croitoru, Mihai, MD  amiodarone (PACERONE) 200 MG tablet TAKE 1 TABLET(200 MG) BY MOUTH DAILY Patient taking differently: Take 200 mg by mouth daily.  10/29/18  Yes Croitoru, Mihai, MD  AURYXIA 1 GM 210 MG(Fe) tablet Take 420 mg by mouth 3  (three) times daily with meals.  08/05/18  Yes [provider]  bictegravir-emtricitabine-tenofovir AF ([name]) 50-200-25 MG TABS tablet Take 1 tablet by mouth daily. 01/01/19  Yes Tommy Medal, Lavell Islam, MD  brimonidine (ALPHAGAN) 0.15 % ophthalmic solution Place 1 drop into the left eye 2 (two) times daily. 11/15/17  Yes [provider]  dorzolamide-timolol (COSOPT) 22.3-6.8 MG/ML ophthalmic solution Place 1 drop into the left eye 2 (two) times daily. 12/17/17  Yes [provider]  latanoprost (XALATAN) 0.005 % ophthalmic solution Place 1 drop into the left eye at bedtime.    Yes [provider]  midodrine (PROAMATINE) 10 MG tablet Take 1 tablet (10 mg total) by mouth as directed. Twice a day on the day of HD Tuesday, Thursday, Saturday. 10/23/18  Yes Lavina Hamman, MD  multivitamin (RENA-VIT) TABS tablet Take 1 tablet by mouth daily.   Yes [provider]  Vitamin D, Ergocalciferol, (DRISDOL) 50000 UNITS CAPS capsule Take 50,000 Units by mouth every Friday.  04/10/15  Yes [provider]  warfarin (COUMADIN) 5 MG tablet Take 2 tablets by mouth daily or as directed by coumadin clinic Patient taking differently: Take 10 mg by mouth daily.  01/30/19  Yes Croitoru, Mihai, MD    Family History Family History  Problem Relation Age of Onset  . Hypertension Mother   . Thyroid disease Mother   . Cholelithiasis Daughter   . Cholelithiasis Son   . Hypertension Maternal Grandmother   . Diabetes Maternal Grandmother   . Heart attack Neg Hx   . Hyperlipidemia Neg Hx     Social History Social History   Tobacco Use  . Smoking status: Former Smoker    Packs/day: 1.00    Years: 30.00    Pack years: 30.00    Types: Cigarettes    Last attempt to quit: 2006    Years since quitting: 14.1  . Smokeless tobacco: Never Used  Substance Use Topics  . Alcohol use: Yes    Comment: once a week-wine  . Drug use: Never     Allergies   Cozaar [losartan  potassium]   Review of Systems Review of Systems  Unable to perform ROS: Acuity of condition  Constitutional: Negative for fever.  Respiratory: Positive for shortness of breath. Negative for sputum production.   Cardiovascular: Negative for palpitations, leg swelling and syncope.  Gastrointestinal: Negative for abdominal pain and vomiting.  Skin: Negative for rash.     Physical Exam Updated Vital Signs BP 128/73   Pulse 88   Temp Marland Kitchen)  96.5 F (35.8 C) (Temporal)   Resp (!) 21   Ht 6\' 2"  (1.88 m)   Wt 86.7 kg   SpO2 97%   BMI 24.54 kg/m   Physical Exam Vitals signs and nursing note reviewed.  Constitutional:      General: He is in acute distress.     Appearance: Normal appearance. He is normal weight.  HENT:     Head: Normocephalic and atraumatic.     Nose: Nose normal.     Mouth/Throat:     Mouth: Mucous membranes are moist.     Pharynx: Oropharynx is clear.  Eyes:     Conjunctiva/sclera: Conjunctivae normal.     Pupils: Pupils are equal, round, and reactive to light.  Neck:     Musculoskeletal: Normal range of motion and neck supple.     Vascular: JVD present.  Cardiovascular:     Rate and Rhythm: Normal rate and regular rhythm.     Pulses: Normal pulses.     Heart sounds: Normal heart sounds.  Pulmonary:     Breath sounds: Wheezing and rales present.  Abdominal:     General: Abdomen is flat. Bowel sounds are normal.     Tenderness: There is no abdominal tenderness. There is no guarding or rebound.  Musculoskeletal:        General: No tenderness or deformity.  Skin:    General: Skin is warm and dry.     Capillary Refill: Capillary refill takes less than 2 seconds.  Neurological:     General: No focal deficit present.     Mental Status: He is alert.  Psychiatric:        Mood and Affect: Mood normal.      ED Treatments / Results  Labs (all labs ordered are listed, but only abnormal results are displayed) Results for orders placed or performed during  the hospital encounter of 02/10/19  CBC with Differential/Platelet  Result Value Ref Range   WBC 14.7 (H) 4.0 - 10.5 K/uL   RBC 3.20 (L) 4.22 - 5.81 MIL/uL   Hemoglobin 11.4 (L) 13.0 - 17.0 g/dL   HCT 34.8 (L) 39.0 - 52.0 %   MCV 108.8 (H) 80.0 - 100.0 fL   MCH 35.6 (H) 26.0 - 34.0 pg   MCHC 32.8 30.0 - 36.0 g/dL   RDW 15.6 (H) 11.5 - 15.5 %   Platelets 271 150 - 400 K/uL   nRBC 0.0 0.0 - 0.2 %   Neutrophils Relative % 76 %   Neutro Abs 11.1 (H) 1.7 - 7.7 K/uL   Lymphocytes Relative 15 %   Lymphs Abs 2.3 0.7 - 4.0 K/uL   Monocytes Relative 5 %   Monocytes Absolute 0.8 0.1 - 1.0 K/uL   Eosinophils Relative 2 %   Eosinophils Absolute 0.4 0.0 - 0.5 K/uL   Basophils Relative 1 %   Basophils Absolute 0.1 0.0 - 0.1 K/uL   Immature Granulocytes 1 %   Abs Immature Granulocytes 0.10 (H) 0.00 - 0.07 K/uL  Basic metabolic panel  Result Value Ref Range   Sodium 137 135 - 145 mmol/L   Potassium 3.8 3.5 - 5.1 mmol/L   Chloride 94 (L) 98 - 111 mmol/L   CO2 25 22 - 32 mmol/L   Glucose, Bld 175 (H) 70 - 99 mg/dL   BUN 66 (H) 8 - 23 mg/dL   Creatinine, Ser 10.56 (H) 0.61 - 1.24 mg/dL   Calcium 8.9 8.9 - 10.3 mg/dL   GFR calc non  Af Amer 4 (L) >60 mL/min   GFR calc Af Amer 5 (L) >60 mL/min   Anion gap 18 (H) 5 - 15  Brain natriuretic peptide  Result Value Ref Range   B Natriuretic Peptide 1,054.2 (H) 0.0 - 100.0 pg/mL  Protime-INR  Result Value Ref Range   Prothrombin Time 29.5 (H) 11.4 - 15.2 seconds   INR 2.9 (H) 0.8 - 1.2  I-stat troponin, ED  Result Value Ref Range   Troponin i, poc 0.04 0.00 - 0.08 ng/mL   Comment 3           Dg Chest Portable 1 View  Result Date: 02/10/2019 CLINICAL DATA:  Shortness of breath EXAM: PORTABLE CHEST 1 VIEW COMPARISON:  12/04/2018, 04/30/2018 FINDINGS: Cardiomegaly with vascular congestion and diffuse bilateral interstitial and ground-glass opacity suspicious for pulmonary edema. Patchy consolidation at both lung bases. No pneumothorax. IMPRESSION:  Cardiomegaly with vascular congestion and diffuse interstitial and ground-glass opacities suspicious for pulmonary edema. Confluent airspace disease at the lung bases may reflect atelectasis or superimposed pneumonia Electronically Signed   By: Donavan Foil M.D.   On: 02/10/2019 23:53    EKG EKG Interpretation  Date/Time:  Monday February 10 2019 23:51:39 EDT Ventricular Rate:  116 PR Interval:    QRS Duration: 143 QT Interval:  435 QTC Calculation: 605 R Axis:   -60 Text Interpretation:  Sinus tachycardia Left bundle branch block Baseline wander in lead(s) III Confirmed by Randal Buba, Mykaylah Ballman ([id number]) on 02/11/2019 12:13:56 AM   Radiology Dg Chest Portable 1 View  Result Date: 02/10/2019 CLINICAL DATA:  Shortness of breath EXAM: PORTABLE CHEST 1 VIEW COMPARISON:  12/04/2018, 04/30/2018 FINDINGS: Cardiomegaly with vascular congestion and diffuse bilateral interstitial and ground-glass opacity suspicious for pulmonary edema. Patchy consolidation at both lung bases. No pneumothorax. IMPRESSION: Cardiomegaly with vascular congestion and diffuse interstitial and ground-glass opacities suspicious for pulmonary edema. Confluent airspace disease at the lung bases may reflect atelectasis or superimposed pneumonia Electronically Signed   By: Donavan Foil M.D.   On: 02/10/2019 23:53    Procedures Procedures (including critical care time)  Medications Ordered in ED Medications  bictegravir-emtricitabine-tenofovir AF ([name]) 50-200-25 MG per tablet 1 tablet (has no administration in time range)  sodium chloride flush (NS) 0.9 % injection 3 mL (has no administration in time range)  sodium chloride flush (NS) 0.9 % injection 3 mL (has no administration in time range)  sodium chloride flush (NS) 0.9 % injection 3 mL (has no administration in time range)  0.9 %  sodium chloride infusion (has no administration in time range)  acetaminophen (TYLENOL) tablet 650 mg (has no administration in time range)    Or   acetaminophen (TYLENOL) suppository 650 mg (has no administration in time range)  HYDROcodone-acetaminophen (NORCO/VICODIN) 5-325 MG per tablet 1 tablet (has no administration in time range)  ondansetron (ZOFRAN) tablet 4 mg (has no administration in time range)    Or  ondansetron (ZOFRAN) injection 4 mg (has no administration in time range)  ceFEPIme (MAXIPIME) 1 g in sodium chloride 0.9 % 100 mL IVPB (has no administration in time range)  nitroGLYCERIN (NITROGLYN) 2 % ointment 1 inch (1 inch Topical Given 02/11/19 0057)  furosemide (LASIX) injection 40 mg (40 mg Intravenous Given 02/11/19 0053)  vancomycin (VANCOCIN) IVPB 1000 mg/200 mL premix (0 mg Intravenous Stopped 02/11/19 0204)  piperacillin-tazobactam (ZOSYN) IVPB 3.375 g (0 g Intravenous Stopped 02/11/19 0148)     MDM Reviewed: previous chart, nursing note and vitals Interpretation: labs, ECG and  x-ray (pulmonary edema by me, negative troponin ) Total time providing critical care: 30-74 minutes (bipap). This excludes time spent performing separately reportable procedures and services. Consults: admitting MD (nephrology )  CRITICAL CARE Performed by: Tonea Leiphart K Orton Capell-Rasch Total critical care time: 60 minutes Critical care time was exclusive of separately billable procedures and treating other patients. Critical care was necessary to treat or prevent imminent or life-threatening deterioration. Critical care was time spent personally by me on the following activities: development of treatment plan with patient and/or surrogate as well as nursing, discussions with consultants, evaluation of patient's response to treatment, examination of patient, obtaining history from patient or surrogate, ordering and performing treatments and interventions, ordering and review of laboratory studies, ordering and review of radiographic studies, pulse oximetry and re-evaluation of patient's condition.  Final Clinical Impressions(s) / ED Diagnoses    Final diagnoses:  Acute pulmonary edema ([place])    Admit to medicine    Daune Colgate, MD 02/11/19 0221

## 2019-02-11 NOTE — Discharge Instructions (Signed)

## 2019-02-12 ENCOUNTER — Telehealth: Payer: Self-pay | Admitting: Internal Medicine

## 2019-02-12 ENCOUNTER — Ambulatory Visit: Payer: Medicare HMO | Admitting: Family

## 2019-02-12 ENCOUNTER — Other Ambulatory Visit: Payer: Self-pay

## 2019-02-12 DIAGNOSIS — J810 Acute pulmonary edema: Secondary | ICD-10-CM | POA: Diagnosis not present

## 2019-02-12 DIAGNOSIS — N186 End stage renal disease: Secondary | ICD-10-CM | POA: Diagnosis not present

## 2019-02-12 DIAGNOSIS — D631 Anemia in chronic kidney disease: Secondary | ICD-10-CM | POA: Diagnosis not present

## 2019-02-12 DIAGNOSIS — N2581 Secondary hyperparathyroidism of renal origin: Secondary | ICD-10-CM | POA: Diagnosis not present

## 2019-02-12 DIAGNOSIS — Z992 Dependence on renal dialysis: Secondary | ICD-10-CM | POA: Diagnosis not present

## 2019-02-12 LAB — CBC
HCT: 30.5 % — ABNORMAL LOW (ref 39.0–52.0)
Hemoglobin: 10 g/dL — ABNORMAL LOW (ref 13.0–17.0)
MCH: 35.5 pg — ABNORMAL HIGH (ref 26.0–34.0)
MCHC: 32.8 g/dL (ref 30.0–36.0)
MCV: 108.2 fL — AB (ref 80.0–100.0)
NRBC: 0 % (ref 0.0–0.2)
PLATELETS: 227 10*3/uL (ref 150–400)
RBC: 2.82 MIL/uL — ABNORMAL LOW (ref 4.22–5.81)
RDW: 15.6 % — ABNORMAL HIGH (ref 11.5–15.5)
WBC: 6.3 10*3/uL (ref 4.0–10.5)

## 2019-02-12 LAB — PROTIME-INR
INR: 3.8 — ABNORMAL HIGH (ref 0.8–1.2)
PROTHROMBIN TIME: 37.2 s — AB (ref 11.4–15.2)

## 2019-02-12 LAB — RENAL FUNCTION PANEL
Albumin: 3.5 g/dL (ref 3.5–5.0)
Anion gap: 11 (ref 5–15)
BUN: 50 mg/dL — ABNORMAL HIGH (ref 8–23)
CO2: 28 mmol/L (ref 22–32)
Calcium: 9 mg/dL (ref 8.9–10.3)
Chloride: 100 mmol/L (ref 98–111)
Creatinine, Ser: 8.74 mg/dL — ABNORMAL HIGH (ref 0.61–1.24)
GFR calc Af Amer: 6 mL/min — ABNORMAL LOW (ref >60)
GFR calc non Af Amer: 5 mL/min — ABNORMAL LOW (ref >60)
Glucose, Bld: 103 mg/dL — ABNORMAL HIGH (ref 70–99)
Phosphorus: 4.1 mg/dL (ref 2.5–4.6)
Potassium: 4.4 mmol/L (ref 3.5–5.1)
Sodium: 139 mmol/L (ref 135–145)

## 2019-02-12 MED ORDER — AMOXICILLIN-POT CLAVULANATE 875-125 MG PO TABS: 1.0000 | ORAL_TABLET | Freq: Every day | ORAL | 0 refills | Status: AC

## 2019-02-12 NOTE — Progress Notes (Signed)
  [facility] Progress Note   Assessment/ Plan:     Dialyzes at [facility] TTS 4 hrs EDW 84 kg HD Bath 2K/ 2.0 Ca, Dialyzer F180, Heparin 5800 u bolus. Access RUE AVF with buttonholes. Calcitriol 0.5 mcg q rx  Assessment/Plan: 1 SOB/ Resp distress: looks like pulm edema +/- superimposed PNA.  Resp status much improved s/p HD, nearly achieved EDW.  Treated with IV abx (vanc/ Zosyn--> cefepime). Blood cultures NGTD x 1 day, could potentially switch to PO antibiotics, leukocytosis much improved.  Per primary.   2 ESRD: TTS [place].  HD 3/10 on regular schedule, post-weight 84.5, nearly EDW and BP and pulm status improved.  Initially thought he would need extra rx, given improvement don't think extra rx necessary--would continue to challenge EDW as OP.   3 Blood pressure:  Takes midodrine 10 mg BID 4. 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 Afib- on warfarin 7.  HIV- on ART, per primary 8.  Gout- on allopurinol 9.  Dispo: OK from renal perspective to go today if reasonable to switch to PO antibiotics.  Subjective:    BP improved, off O2, feeling better.  Eager for discharge.  Requesting 4x week HD "to get the fluid off"- this is not routinely done except in cases of extreme intolerance to even conservative UF.  Discussion re: adherence to fluid restriction.     Objective:   BP 138/73 (BP Location: Left Arm)   Pulse 79   Temp 97.8 F (36.6 C) (Oral)   Resp (!) 23   Ht 6\' 2"  (1.88 m)   Wt 84.5 kg   SpO2 96%   BMI 23.92 kg/m   Physical Exam: GEN: comfortable appearing HEENT EOMI, PERRL, MMM NECK no JVD PULM off O2, still faint R basilar inspiratory crackles but much improved, nothing really sig on the L CV RRR no m/r/g ABD soft, nondistended, NABS EXT no LE edema NEURO AAO x 3 nonfocal SKIN no rashes/ lesions ACCESS: LUE AVF + T/B, buttonholes look excellent  Labs: BMET Recent Labs  Lab  02/11/19 0015 02/11/19 0337 02/12/19 0200  NA 137 135 139  K 3.8 5.1 4.4  CL 94* 94* 100  CO2 25 25 28   GLUCOSE 175* 108* 103*  BUN 66* 71* 50*  CREATININE 10.56* 10.82* 8.74*  CALCIUM 8.9 8.9 9.0  PHOS  --   --  4.1   CBC Recent Labs  Lab 02/11/19 0015 02/11/19 0337 02/12/19 0200  WBC 14.7* 10.5 6.3  NEUTROABS 11.1* 8.5*  --   HGB 11.4* 10.3* 10.0*  HCT 34.8* 32.2* 30.5*  MCV 108.8* 108.4* 108.2*  PLT 271 262 227    @IMGRELPRIORS @ Medications:    . amiodarone  200 mg Oral Daily  . bictegravir-emtricitabine-tenofovir AF  1 tablet Oral Daily  . Chlorhexidine Gluconate Cloth  6 each Topical Q0600  . ferric citrate  420 mg Oral TID WC  . midodrine  10 mg Oral BID WC  . sodium chloride flush  3 mL Intravenous Q12H  . warfarin  10 mg Oral q1800  . Warfarin - Pharmacist Dosing Inpatient   Does not apply Watson, MD [facility] pgr [phone] 02/12/2019, 10:09 AM

## 2019-02-12 NOTE — Telephone Encounter (Signed)
Called to skip coumadin dose tonight

## 2019-02-12 NOTE — Discharge Summary (Signed)
Physician Discharge Summary  [facility] ONG:[id number] DOB: Apr 03, 1941 DOA: 02/10/2019  PCP: [name], NP  Admit date: 02/10/2019 Discharge date: 02/12/2019  Admitted From: Home. Disposition: Home.  Recommendations for Outpatient Follow-up:  1. Follow up with PCP in 1-2 weeks 2. Please obtain BMP/CBC in one week 3. Please follow up on the following pending results:  Home Health: None. Equipment/Devices: None.  Discharge Condition: Stable. CODE STATUS: Full code. Diet recommendation: Heart healthy.  Brief/Interim Summary: 78 year old gentleman with history of HIV, CD4 reporting and undetectable virus load, paroxysmal A. fib on Coumadin, ESRD on hemodialysis who presented to the emergency room on 02/10/2019 with 1 day history of shortness of breath.  He had his usual dialysis the day before.  In the emergency room, patient was found with acute respiratory distress.  Chest x-ray found cardiomegaly and vascular congestion.  Leukocytosis of 14,000 and mild microcytic anemia.  He was initially needing BiPAP support, was admitted to hospital, underwent hemodialysis with fluid removal and currently asymptomatic.  Acute respiratory distress secondary to pulmonary edema, treated with BiPAP.  Improved.  Currently on room air.  Ambulated without difficulty.  Initially suspected of probable pneumonia.  Patient does not have any evidence of infection.  He has received vancomycin and cefepime.  Blood cultures negative so far.  Will discharge on 5 days of oral Augmentin, renally dosed.  Patient is on amiodarone, so unable to prescribe Levaquin.  ESRD on hemodialysis: Patient received additional dialysis in the hospital.  Currently euvolemic.  He will resume his dialysis tomorrow.  Chronic medical issues including HIV, paroxysmal A. fib, hypertension all remained stable.  Patient is therapeutic on Coumadin and sinus rhythm on amiodarone.  Discharge Diagnoses:  Principal Problem:   Acute  pulmonary edema (HCC) Active Problems:   Macrocytic anemia   Pneumonia   HIV (human immunodeficiency virus infection) ([place])   ESRD (end stage renal disease) ([place])   Paroxysmal atrial fibrillation [facility])    Discharge Instructions  Discharge Instructions    Call MD for:  difficulty breathing, headache or visual disturbances   Complete by:  As directed    Call MD for:  temperature >100.4   Complete by:  As directed    Diet - low sodium heart healthy   Complete by:  As directed    Discharge instructions   Complete by:  As directed    Go to dialysis center tomorrow   Increase activity slowly   Complete by:  As directed      Allergies as of 02/12/2019      Reactions   Cozaar [losartan Potassium] Other (See Comments)   Causes constipation      Medication List    TAKE these medications   acetaminophen 500 MG tablet Commonly known as:  TYLENOL Take 1,000 mg by mouth every 6 (six) hours as needed for mild pain.   allopurinol 100 MG tablet Commonly known as:  ZYLOPRIM TAKE 1 TABLET EVERY DAY   amiodarone 200 MG tablet Commonly known as:  PACERONE TAKE 1 TABLET(200 MG) BY MOUTH DAILY What changed:  See the new instructions.   amoxicillin-clavulanate 875-125 MG tablet Commonly known as:  Augmentin Take 1 tablet by mouth daily for 7 days.   Auryxia 1 GM 210 MG(Fe) tablet Generic drug:  ferric citrate Take 420 mg by mouth 3 (three) times daily with meals.   bictegravir-emtricitabine-tenofovir AF 50-200-25 MG Tabs tablet Commonly known as:  [name] Take 1 tablet by mouth daily.   brimonidine 0.15 % ophthalmic  solution Commonly known as:  ALPHAGAN Place 1 drop into the left eye 2 (two) times daily.   dorzolamide-timolol 22.3-6.8 MG/ML ophthalmic solution Commonly known as:  COSOPT Place 1 drop into the left eye 2 (two) times daily.   latanoprost 0.005 % ophthalmic solution Commonly known as:  XALATAN Place 1 drop into the left eye at bedtime.   midodrine 10 MG  tablet Commonly known as:  PROAMATINE Take 1 tablet (10 mg total) by mouth as directed. Twice a day on the day of HD Tuesday, Thursday, Saturday.   multivitamin Tabs tablet Take 1 tablet by mouth daily.   Vitamin D (Ergocalciferol) 1.25 MG (50000 UT) Caps capsule Commonly known as:  DRISDOL Take 50,000 Units by mouth every Friday.   warfarin 5 MG tablet Commonly known as:  COUMADIN Take as directed. If you are unsure how to take this medication, talk to your nurse or doctor. Original instructions:  Take 2 tablets by mouth daily or as directed by coumadin clinic What changed:    how much to take  how to take this  when to take this  additional instructions       Allergies  Allergen Reactions  . Cozaar [Losartan Potassium] Other (See Comments)    Causes constipation     Consultations:  Nephrology   Procedures/Studies: Dg Chest Portable 1 View  Result Date: 02/10/2019 CLINICAL DATA:  Shortness of breath EXAM: PORTABLE CHEST 1 VIEW COMPARISON:  12/04/2018, 04/30/2018 FINDINGS: Cardiomegaly with vascular congestion and diffuse bilateral interstitial and ground-glass opacity suspicious for pulmonary edema. Patchy consolidation at both lung bases. No pneumothorax. IMPRESSION: Cardiomegaly with vascular congestion and diffuse interstitial and ground-glass opacities suspicious for pulmonary edema. Confluent airspace disease at the lung bases may reflect atelectasis or superimposed pneumonia Electronically Signed   By: Donavan Foil M.D.   On: 02/10/2019 23:53       Subjective: Patient seen and examined at the day of discharge.  He is eager to go home.  Denies any complaints.  Denies any chest pain or shortness of breath.  He ambulated in the hallway on room air.  No fever or chills.  Blood cultures negative so far.  He has some cough early morning when he wakes up.  Discharge Exam: Vitals:   02/12/19 0701 02/12/19 0720  BP: 139/80 138/73  Pulse: 77 79  Resp: (!) 22 (!)  23  Temp:  97.8 F (36.6 C)  SpO2: 99% 96%   Vitals:   02/12/19 0538 02/12/19 0551 02/12/19 0701 02/12/19 0720  BP:  (!) 142/77 139/80 138/73  Pulse:  81 77 79  Resp:  18 (!) 22 (!) 23  Temp:  98 F (36.7 C)  97.8 F (36.6 C)  TempSrc:  Oral  Oral  SpO2:  97% 99% 96%  Weight: 84.5 kg     Height:        General: Pt is alert, awake, not in acute distress Cardiovascular: RRR, S1/S2 +, no rubs, no gallops Respiratory: CTA bilaterally, no wheezing, no rhonchi Abdominal: Soft, NT, ND, bowel sounds + Extremities: no edema, no cyanosis Right upper extremity AV fistula with thrill.    The results of significant diagnostics from this hospitalization (including imaging, microbiology, ancillary and laboratory) are listed below for reference.     Microbiology: Recent Results (from the past 240 hour(s))  Blood culture (routine x 2)     Status: None (Preliminary result)   Collection Time: 02/11/19 12:24 AM  Result Value Ref Range Status  Specimen Description BLOOD LEFT WRIST  Final   Special Requests   Final    BOTTLES DRAWN AEROBIC AND ANAEROBIC Blood Culture adequate volume   Culture   Final    NO GROWTH 1 DAY Performed at [facility], [address] [place], [place] [address]    Report Status PENDING  Incomplete  Blood culture (routine x 2)     Status: None (Preliminary result)   Collection Time: 02/11/19 12:30 AM  Result Value Ref Range Status   Specimen Description BLOOD LEFT FOREARM  Final   Special Requests   Final    BOTTLES DRAWN AEROBIC AND ANAEROBIC Blood Culture results may not be optimal due to an inadequate volume of blood received in culture bottles   Culture   Final    NO GROWTH 1 DAY Performed at [facility], [place] [address], [place], [place] [address]    Report Status PENDING  Incomplete  MRSA PCR Screening     Status: None   Collection Time: 02/11/19  1:15 PM  Result Value Ref Range Status   MRSA by PCR NEGATIVE NEGATIVE Final    Comment:         The GeneXpert MRSA Assay (FDA approved for NASAL specimens only), is one component of a comprehensive MRSA colonization surveillance program. It is not intended to diagnose MRSA infection nor to guide or monitor treatment for MRSA infections. Performed at [facility], [place] [address] [place],  [address]      Labs: BNP (last 3 results) Recent Labs    11/01/18 0512 12/04/18 2207 02/11/19 0015  BNP 556.2* 1,630.5* 9,147.8*   Basic Metabolic Panel: Recent Labs  Lab 02/11/19 0015 02/11/19 0337 02/12/19 0200  NA 137 135 139  K 3.8 5.1 4.4  CL 94* 94* 100  CO2 25 25 28   GLUCOSE 175* 108* 103*  BUN 66* 71* 50*  CREATININE 10.56* 10.82* 8.74*  CALCIUM 8.9 8.9 9.0  PHOS  --   --  4.1   Liver Function Tests: Recent Labs  Lab 02/12/19 0200  ALBUMIN 3.5   No results for input(s): LIPASE, AMYLASE in the last 168 hours. No results for input(s): AMMONIA in the last 168 hours. CBC: Recent Labs  Lab 02/11/19 0015 02/11/19 0337 02/12/19 0200  WBC 14.7* 10.5 6.3  NEUTROABS 11.1* 8.5*  --   HGB 11.4* 10.3* 10.0*  HCT 34.8* 32.2* 30.5*  MCV 108.8* 108.4* 108.2*  PLT 271 262 227   Cardiac Enzymes: No results for input(s): CKTOTAL, CKMB, CKMBINDEX, TROPONINI in the last 168 hours. BNP: Invalid input(s): POCBNP CBG: No results for input(s): GLUCAP in the last 168 hours. D-Dimer No results for input(s): DDIMER in the last 72 hours. Hgb A1c No results for input(s): HGBA1C in the last 72 hours. Lipid Profile No results for input(s): CHOL, HDL, LDLCALC, TRIG, CHOLHDL, LDLDIRECT in the last 72 hours. Thyroid function studies No results for input(s): TSH, T4TOTAL, T3FREE, THYROIDAB in the last 72 hours.  Invalid input(s): FREET3 Anemia work up No results for input(s): VITAMINB12, FOLATE, FERRITIN, TIBC, IRON, RETICCTPCT in the last 72 hours. Urinalysis    Component Value Date/Time   COLORURINE YELLOW 02/17/2018 0540   APPEARANCEUR CLOUDY (A)  02/17/2018 0540   LABSPEC 1.010 02/17/2018 0540   PHURINE 5.0 02/17/2018 0540   GLUCOSEU NEGATIVE 02/17/2018 0540   HGBUR LARGE (A) 02/17/2018 0540   BILIRUBINUR NEGATIVE 02/17/2018 0540   BILIRUBINUR negative 04/16/2013 1447   KETONESUR NEGATIVE 02/17/2018  0540   PROTEINUR 100 (A) 02/17/2018 0540   UROBILINOGEN 0.2 05/11/2014 1352   NITRITE NEGATIVE 02/17/2018 0540   LEUKOCYTESUR NEGATIVE 02/17/2018 0540   Sepsis Labs Invalid input(s): PROCALCITONIN,  WBC,  LACTICIDVEN Microbiology Recent Results (from the past 240 hour(s))  Blood culture (routine x 2)     Status: None (Preliminary result)   Collection Time: 02/11/19 12:24 AM  Result Value Ref Range Status   Specimen Description BLOOD LEFT WRIST  Final   Special Requests   Final    BOTTLES DRAWN AEROBIC AND ANAEROBIC Blood Culture adequate volume   Culture   Final    NO GROWTH 1 DAY Performed at [facility], [place] [address], [place], [place] [address]    Report Status PENDING  Incomplete  Blood culture (routine x 2)     Status: None (Preliminary result)   Collection Time: 02/11/19 12:30 AM  Result Value Ref Range Status   Specimen Description BLOOD LEFT FOREARM  Final   Special Requests   Final    BOTTLES DRAWN AEROBIC AND ANAEROBIC Blood Culture results may not be optimal due to an inadequate volume of blood received in culture bottles   Culture   Final    NO GROWTH 1 DAY Performed at [facility], [place] [address], [place], [place] [address]    Report Status PENDING  Incomplete  MRSA PCR Screening     Status: None   Collection Time: 02/11/19  1:15 PM  Result Value Ref Range Status   MRSA by PCR NEGATIVE NEGATIVE Final    Comment:        The GeneXpert MRSA Assay (FDA approved for NASAL specimens only), is one component of a comprehensive MRSA colonization surveillance program. It is not intended to diagnose MRSA infection nor to guide or monitor treatment for MRSA infections. Performed at [facility], [place] [address], [place], [place] [address]      Time coordinating discharge:  25 minutes  SIGNED:   Barb Merino, MD  [facility] 02/12/2019, 11:05 AM Pager   If 7PM-7AM, please contact night-coverage [email or web address] Password [email or web address]

## 2019-02-12 NOTE — Progress Notes (Signed)
ANTICOAGULATION CONSULT NOTE - follow up  Pharmacy Consult for Warfarin  Indication: atrial fibrillation  Allergies  Allergen Reactions  . Cozaar [Losartan Potassium] Other (See Comments)    Causes constipation     Patient Measurements: Height: 6\' 2"  (188 cm) Weight: 186 lb 4.6 oz (84.5 kg) IBW/kg (Calculated) : 82.2  Vital Signs: Temp: 97.8 F (36.6 C) (03/11 0720) Temp Source: Oral (03/11 0720) BP: 138/73 (03/11 0720) Pulse Rate: 79 (03/11 0720)  Labs: Recent Labs    02/11/19 0015 02/11/19 0018 02/11/19 0337 02/12/19 0200  HGB 11.4*  --  10.3* 10.0*  HCT 34.8*  --  32.2* 30.5*  PLT 271  --  262 227  LABPROT  --  29.5*  --  37.2*  INR  --  2.9*  --  3.8*  CREATININE 10.56*  --  10.82* 8.74*    Estimated Creatinine Clearance: 8.4 mL/min (A) (by C-G formula based on SCr of 8.74 mg/dL (H)).   Medical History: Past Medical History:  Diagnosis Date  . Acute on chronic systolic and diastolic heart failure, NYHA class 4 ([place])   . Anemia, iron deficiency 11/15/2011  . Arthritis    "hands, right knee, feet" (02/21/2018)  . Atrial fibrillation ([place])   . Cancer [facility])    hx of prostate; s/p radioactive seed implant 10/2009 Dr Janice Norrie  . Cardiac arrest ([name]) 02/17/2018  . CHF (congestive heart failure) ([place])   . Chronic lower back pain   . CKD (chronic kidney disease) stage V requiring chronic dialysis ([place])   . ESRD on dialysis ([place])    started 02/2018  . GERD (gastroesophageal reflux disease)   . Glaucoma   . Gout    daily RX (02/21/2018)  . Heart murmur    "mild" per pt  . Hepatitis    years ago  . History of cardiac cath 2004   negative for CAD  . History of myocardial perfusion scan 02/2010   negative for coronary insufficiency (LVEF 27%)  . HIV (human immunodeficiency virus infection) ([place])   . HIV infection ([place])   . Hyperlipidemia   . Hypertension    followed by [facility] (Dr Dani Gobble [place]u)  . Hypertension   . Nonischemic  cardiomyopathy ([place])   . Pneumonia 11/2017  . Prostate cancer ([place])   . Sinus bradycardia   . Sleep apnea    does not use a cpap  . Stroke [facility])    "mini stroke" years ago  . 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  Admitted 3/10 with therapeutic INR. Today the INR has increased to 3.8 supratherapeutic. Hgb low/stable at 10.0, pltc is within normal limits.  Warfarin PTA dosing: 10 mg daily   (INR noted to be therapeutic at last two anticoag visits in 01/2019 on 10mg  daily).  INR increase may be due to antibiotics.   Goal of Therapy:  INR 2-3 Monitor platelets by anticoagulation protocol: Yes   Plan:  Hold Warfarin dose today Daily PT/INR Monitor for bleeding  --addendum:  Patient already discharged.   Nicole Cella, RPh Clinical Pharmacist [phone] or Please check AMION for all [facility] phone numbers After 10:00 PM, call [place] [phone] 02/12/2019,12:15 PM

## 2019-02-13 ENCOUNTER — Telehealth: Payer: Self-pay | Admitting: *Deleted

## 2019-02-13 DIAGNOSIS — N2581 Secondary hyperparathyroidism of renal origin: Secondary | ICD-10-CM | POA: Diagnosis not present

## 2019-02-13 DIAGNOSIS — N186 End stage renal disease: Secondary | ICD-10-CM | POA: Diagnosis not present

## 2019-02-13 LAB — PROTIME-INR: INR: 4.2 — AB (ref 0.9–1.1)

## 2019-02-13 NOTE — Telephone Encounter (Signed)
Left message to call office. Pt has already scheduled hospital follow up appt

## 2019-02-14 ENCOUNTER — Telehealth: Payer: Self-pay | Admitting: *Deleted

## 2019-02-14 NOTE — Telephone Encounter (Signed)
No answer

## 2019-02-15 DIAGNOSIS — N2581 Secondary hyperparathyroidism of renal origin: Secondary | ICD-10-CM | POA: Diagnosis not present

## 2019-02-15 DIAGNOSIS — N186 End stage renal disease: Secondary | ICD-10-CM | POA: Diagnosis not present

## 2019-02-16 LAB — CULTURE, BLOOD (ROUTINE X 2)
CULTURE: NO GROWTH
Culture: NO GROWTH
Special Requests: ADEQUATE

## 2019-02-17 ENCOUNTER — Ambulatory Visit (INDEPENDENT_AMBULATORY_CARE_PROVIDER_SITE_OTHER): Payer: Medicare HMO | Admitting: Cardiovascular Disease

## 2019-02-17 DIAGNOSIS — Z5181 Encounter for therapeutic drug level monitoring: Secondary | ICD-10-CM | POA: Diagnosis not present

## 2019-02-17 DIAGNOSIS — I482 Chronic atrial fibrillation, unspecified: Secondary | ICD-10-CM | POA: Diagnosis not present

## 2019-02-18 DIAGNOSIS — N186 End stage renal disease: Secondary | ICD-10-CM | POA: Diagnosis not present

## 2019-02-18 DIAGNOSIS — N2581 Secondary hyperparathyroidism of renal origin: Secondary | ICD-10-CM | POA: Diagnosis not present

## 2019-02-20 ENCOUNTER — Telehealth: Payer: Self-pay

## 2019-02-20 DIAGNOSIS — N186 End stage renal disease: Secondary | ICD-10-CM | POA: Diagnosis not present

## 2019-02-20 DIAGNOSIS — N2581 Secondary hyperparathyroidism of renal origin: Secondary | ICD-10-CM | POA: Diagnosis not present

## 2019-02-20 LAB — PROTIME-INR: INR: 2.3 — AB (ref <=1.1)

## 2019-02-20 NOTE — Telephone Encounter (Signed)
LMOM FOR PRESCREEN  

## 2019-02-21 ENCOUNTER — Encounter: Payer: Self-pay | Admitting: Family

## 2019-02-21 ENCOUNTER — Telehealth: Payer: Self-pay | Admitting: Cardiology

## 2019-02-21 ENCOUNTER — Ambulatory Visit (INDEPENDENT_AMBULATORY_CARE_PROVIDER_SITE_OTHER): Payer: Medicare HMO | Admitting: *Deleted

## 2019-02-21 ENCOUNTER — Ambulatory Visit (INDEPENDENT_AMBULATORY_CARE_PROVIDER_SITE_OTHER): Payer: Medicare HMO | Admitting: Family

## 2019-02-21 ENCOUNTER — Other Ambulatory Visit: Payer: Self-pay

## 2019-02-21 VITALS — BP 127/61 | HR 74 | Temp 97.6°F | Resp 16 | Ht 74.0 in | Wt 188.0 lb

## 2019-02-21 DIAGNOSIS — N186 End stage renal disease: Secondary | ICD-10-CM | POA: Diagnosis not present

## 2019-02-21 DIAGNOSIS — I4891 Unspecified atrial fibrillation: Secondary | ICD-10-CM

## 2019-02-21 DIAGNOSIS — I509 Heart failure, unspecified: Secondary | ICD-10-CM

## 2019-02-21 DIAGNOSIS — L299 Pruritus, unspecified: Secondary | ICD-10-CM | POA: Insufficient documentation

## 2019-02-21 DIAGNOSIS — Z5181 Encounter for therapeutic drug level monitoring: Secondary | ICD-10-CM

## 2019-02-21 DIAGNOSIS — J189 Pneumonia, unspecified organism: Secondary | ICD-10-CM

## 2019-02-21 DIAGNOSIS — I482 Chronic atrial fibrillation, unspecified: Secondary | ICD-10-CM

## 2019-02-21 LAB — CBC WITH DIFFERENTIAL/PLATELET
BASOS PCT: 1 % (ref 0.0–3.0)
Basophils Absolute: 0.1 10*3/uL (ref 0.0–0.1)
Eosinophils Absolute: 0.2 10*3/uL (ref 0.0–0.7)
Eosinophils Relative: 3.3 % (ref 0.0–5.0)
HCT: 33.9 % — ABNORMAL LOW (ref 39.0–52.0)
Hemoglobin: 11.3 g/dL — ABNORMAL LOW (ref 13.0–17.0)
Lymphocytes Relative: 26.5 % (ref 12.0–46.0)
Lymphs Abs: 1.7 10*3/uL (ref 0.7–4.0)
MCHC: 33.2 g/dL (ref 30.0–36.0)
MCV: 108 fl — ABNORMAL HIGH (ref 78.0–100.0)
Monocytes Absolute: 0.8 10*3/uL (ref 0.1–1.0)
Monocytes Relative: 11.7 % (ref 3.0–12.0)
NEUTROS ABS: 3.8 10*3/uL (ref 1.4–7.7)
Neutrophils Relative %: 57.5 % (ref 43.0–77.0)
Platelets: 290 10*3/uL (ref 150.0–400.0)
RBC: 3.14 Mil/uL — ABNORMAL LOW (ref 4.22–5.81)
RDW: 17.1 % — ABNORMAL HIGH (ref 11.5–15.5)
WBC: 6.5 10*3/uL (ref 4.0–10.5)

## 2019-02-21 LAB — POCT INR: INR: 2.3 (ref 2.0–3.0)

## 2019-02-21 LAB — PROTIME-INR: INR: 2.3 — AB (ref 0.9–1.1)

## 2019-02-21 MED ORDER — LORATADINE 10 MG PO TABS: 10.0000 mg | ORAL_TABLET | Freq: Every day | ORAL | 11 refills | Status: DC

## 2019-02-21 NOTE — Patient Instructions (Signed)
Description   Continue taking 5mg  everyday.  Recheck in 2 weeks. Orders faxed to [facility] at [phone]

## 2019-02-21 NOTE — Progress Notes (Signed)
Subjective:    Patient ID: David [name], male    DOB: October 07, 1941, 78 y.o.   MRN: [id number]  HPI  Patient is a 78 yr old male who presents today for hospital follow up. He presented to the ED with acute respiratory distress secondary to pulmonary edema. He was treated with bipap. He underwent an additional hemodialysis. Blood cultures were negative x 8.  He was also treated for pneumonia with Vanc/zosyn and then cefepime. Discharged on augmentin.   Reports that he is watching his fluid intake. Denies fevers. He denies current shortness of breath or LE edema. He completed oral abx.   Continues HD Tues/Thursday and Saturday.   Review of Systems See HPI  Past Medical History:  Diagnosis Date  . Acute on chronic systolic and diastolic heart failure, NYHA class 4 ([place])   . Anemia, iron deficiency 11/15/2011  . Arthritis    "hands, right knee, feet" (02/21/2018)  . Atrial fibrillation ([place])   . Cancer [facility])    hx of prostate; s/p radioactive seed implant 10/2009 Dr Janice Norrie  . Cardiac arrest ([place]) 02/17/2018  . CHF (congestive heart failure) ([place])   . Chronic lower back pain   . CKD (chronic kidney disease) stage V requiring chronic dialysis ([place])   . ESRD on dialysis ([place])    started 02/2018  . GERD (gastroesophageal reflux disease)   . Glaucoma   . Gout    daily RX (02/21/2018)  . Heart murmur    "mild" per pt  . Hepatitis    years ago  . History of cardiac cath 2004   negative for CAD  . History of myocardial perfusion scan 02/2010   negative for coronary insufficiency (LVEF 27%)  . HIV (human immunodeficiency virus infection) ([place])   . HIV infection ([place])   . Hyperlipidemia   . Hypertension    followed by [facility] (Dr Dani Gobble Croitoru)  . Hypertension   . Nonischemic cardiomyopathy ([place])   . Pneumonia 11/2017  . Prostate cancer ([place])   . Sinus bradycardia   . Sleep apnea    does not use a cpap  . Stroke [facility])    "mini stroke" years ago  .  Systolic and diastolic CHF, acute ([place]) 06/2010   felt to be secondary to hypertensive cardiomyopathy     Social History   Socioeconomic History  . Marital status: Married    Spouse name: Not on file  . Number of children: 2  . Years of education: 38  . Highest education level: Not on file  Occupational History  . Occupation: retired, picks up Aeronautical engineer: RETIRED  Social Needs  . Financial resource strain: Not on file  . Food insecurity:    Worry: Not on file    Inability: Not on file  . Transportation needs:    Medical: Not on file    Non-medical: Not on file  Tobacco Use  . Smoking status: Former Smoker    Packs/day: 1.00    Years: 30.00    Pack years: 30.00    Types: Cigarettes    Last attempt to quit: 2006    Years since quitting: 14.2  . Smokeless tobacco: Never Used  Substance and Sexual Activity  . Alcohol use: Yes    Comment: once a week-wine  . Drug use: Never  . Sexual activity: Not Currently  Lifestyle  . Physical activity:    Days per week: Not on file    Minutes  per session: Not on file  . Stress: Not on file  Relationships  . Social connections:    Talks on phone: Not on file    Gets together: Not on file    Attends religious service: Not on file    Active member of club or organization: Not on file    Attends meetings of clubs or organizations: Not on file    Relationship status: Not on file  . Intimate partner violence:    Fear of current or ex partner: Not on file    Emotionally abused: Not on file    Physically abused: Not on file    Forced sexual activity: Not on file  Other Topics Concern  . Not on file  Social History Narrative   ** Merged History Encounter **       Stays active at home Regular exercise: no Drinks 2 cups of coffee a week, 1 mountain dew soda a day.    Past Surgical History:  Procedure Laterality Date  . AV FISTULA PLACEMENT Right 10/13/2016   Procedure: ARTERIOVENOUS (AV) FISTULA CREATION;  Surgeon:  [name], MD;  Location: [place];  Service: Vascular;  Laterality: Right;  . AV FISTULA PLACEMENT Right 10/13/2016   [name] [phone]  . AV FISTULA PLACEMENT Left 02/25/2018   Procedure: INSERTION OF ARTERIOVENOUS (AV) GORE-TEX GRAFT LEFT UPPER ARM;  Surgeon: Angelia Mould, MD;  Location: [place];  Service: Vascular;  Laterality: Left;  . AV FISTULA PLACEMENT Right 08/07/2018   Procedure: Creation of right arm brachiocephalic Fistula;  Surgeon: Waynetta Sandy, MD;  Location: [place];  Service: Vascular;  Laterality: Right;  . BASCILIC VEIN TRANSPOSITION Left 06/24/2015   Procedure: BASILIC VEIN TRANSPOSITION;  Surgeon: Mal Misty, MD;  Location: [place];  Service: Vascular;  Laterality: Left;  . BASCILIC VEIN TRANSPOSITION Left 0/76/2263 Bascilic vein transposition (Left)   [name] [phone]  . CARDIAC CATHETERIZATION  02/04/2003   mildly depressed LV systolic fx EF 38%,LHTDSK coronaries/abdominal aorta/renal arteries.  Marland Kitchen CARDIAC CATHETERIZATION  2004  . CATARACT EXTRACTION, BILATERAL Bilateral   . COLONOSCOPY    . ESOPHAGOGASTRODUODENOSCOPY (EGD) WITH PROPOFOL N/A 05/05/2014   Procedure: ESOPHAGOGASTRODUODENOSCOPY (EGD) WITH PROPOFOL;  Surgeon: Missy Sabins, MD;  [facility];  Service: Endoscopy;  Laterality: N/A;  . EYE SURGERY Bilateral    cataract surgery   . EYE SURGERY Bilateral    glaucoma surgery  . EYE SURGERY    . GLAUCOMA SURGERY Bilateral   . INSERTION OF ARTERIOVENOUS (AV) ARTEGRAFT ARM  10/09/2018   Procedure: INSERTION OF ARTERIOVENOUS (AV) 26mm x 41cm ARTEGRAFT LEFT UPPER ARM;  Surgeon: Waynetta Sandy, MD;  Location: [place];  Service: Vascular;;  . INSERTION PROSTATE RADIATION SEED    . IR FLUORO GUIDE CV LINE RIGHT  02/18/2018  . IR REMOVAL TUN CV CATH W/O FL  12/06/2018  . IR US GUIDE VASC ACCESS RIGHT  02/18/2018  . LEFT HEART CATH AND CORONARY ANGIOGRAPHY N/A 02/20/2018   Procedure: LEFT HEART CATH AND CORONARY ANGIOGRAPHY;  Surgeon: Jettie Booze, MD;  Location: [place] CV LAB;  Service: Cardiovascular;  Laterality: N/A;  . NM MYOCAR PERF WALL MOTION  02/21/2010   normal  . RADIOACTIVE SEED IMPLANT  2010   prostate cancer  . THROMBECTOMY W/ EMBOLECTOMY Left 02/26/2018   Procedure: THROMBECTOMY ARTERIOVENOUS GRAFT;  Surgeon: Angelia Mould, MD;  Location: [place];  Service: Vascular;  Laterality: Left;  . ULTRASOUND GUIDANCE FOR VASCULAR ACCESS  02/20/2018   Procedure: Ultrasound Guidance For Vascular Access;  Surgeon: Jettie Booze, MD;  Location: [place] CV LAB;  Service: Cardiovascular;;  . UPPER EXTREMITY VENOGRAPHY N/A 07/08/2018   Procedure: UPPER EXTREMITY VENOGRAPHY;  Surgeon: Waynetta Sandy, MD;  Location: [place] CV LAB;  Service: Cardiovascular;  Laterality: N/A;  Bilateral  . US ECHOCARDIOGRAPHY  02/06/2012   mild LVH,LA mod. dilated,mild-mod. 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 AI.    Family History  Problem Relation Age of Onset  . Hypertension Mother   . Thyroid disease Mother   . Cholelithiasis Daughter   . Cholelithiasis Son   . Hypertension Maternal Grandmother   . Diabetes Maternal Grandmother   . Heart attack Neg Hx   . Hyperlipidemia Neg Hx     Allergies  Allergen Reactions  . Cozaar [Losartan Potassium] Other (See Comments)    Causes constipation     Current Outpatient Medications on File Prior to Visit  Medication Sig Dispense Refill  . acetaminophen (TYLENOL) 500 MG tablet Take 1,000 mg by mouth every 6 (six) hours as needed for mild pain.    Marland Kitchen allopurinol (ZYLOPRIM) 100 MG tablet TAKE 1 TABLET EVERY DAY (Patient taking differently: Take 100 mg by mouth daily. ) 90 tablet 1  . amiodarone (PACERONE) 200 MG tablet TAKE 1 TABLET(200 MG) BY MOUTH DAILY (Patient taking differently: Take 200 mg by mouth daily. ) 90 tablet 1  . AURYXIA 1 GM 210 MG(Fe) tablet Take 420 mg by mouth 3 (three) times daily with meals.     .  bictegravir-emtricitabine-tenofovir AF ([name]) 50-200-25 MG TABS tablet Take 1 tablet by mouth daily. 30 tablet 11  . brimonidine (ALPHAGAN) 0.15 % ophthalmic solution Place 1 drop into the left eye 2 (two) times daily.  3  . dorzolamide-timolol (COSOPT) 22.3-6.8 MG/ML ophthalmic solution Place 1 drop into the left eye 2 (two) times daily.    Marland Kitchen latanoprost (XALATAN) 0.005 % ophthalmic solution Place 1 drop into the left eye at bedtime.     . midodrine (PROAMATINE) 10 MG tablet Take 1 tablet (10 mg total) by mouth as directed. Twice a day on the day of HD Tuesday, Thursday, Saturday. 30 tablet 0  . multivitamin (RENA-VIT) TABS tablet Take 1 tablet by mouth daily.    . Vitamin D, Ergocalciferol, (DRISDOL) 50000 UNITS CAPS capsule Take 50,000 Units by mouth every Friday.   2  . warfarin (COUMADIN) 5 MG tablet Take 2 tablets by mouth daily or as directed by coumadin clinic (Patient taking differently: Take 10 mg by mouth daily. ) 60 tablet 1   No current facility-administered medications on file prior to visit.     BP 127/61 (BP Location: Left Arm, Patient Position: Sitting, Cuff Size: Small)   Pulse 74   Temp 97.6 F (36.4 C) (Oral)   Resp 16   Ht 6\' 2"  (1.88 m)   Wt 188 lb (85.3 kg)   SpO2 99%   BMI 24.14 kg/m       Objective:   Physical Exam Constitutional:      General: He is not in acute distress.    Appearance: He is well-developed.  HENT:     Head: Normocephalic and atraumatic.  Cardiovascular:     Rate and Rhythm: Normal rate and regular rhythm.     Heart sounds: Murmur present. Systolic murmur present with a grade of 2/6.  Pulmonary:     Effort: Pulmonary effort is normal. No respiratory distress.  Breath sounds: Normal breath sounds. No wheezing or rales.  Musculoskeletal:     Right lower leg: No edema.     Left lower leg: No edema.  Skin:    General: Skin is warm and dry.  Neurological:     Mental Status: He is alert and oriented to person, place, and time.   Psychiatric:        Behavior: Behavior normal.        Thought Content: Thought content normal.           Assessment & Plan:  ESRD- continues HD, appears euvolemic.  Wt Readings from Last 3 Encounters:  02/21/19 188 lb (85.3 kg)  02/12/19 186 lb 4.6 oz (84.5 kg)  01/17/19 191 lb 3.2 oz (86.7 kg)   Pneumonia- clinically improved. Lung exam normal.  Routine xrays not being performed unless urgent at our imaging center.  Will defer follow up xray at this time as he is clinically improved with good oxygen saturation. Obtain follow up cbc.   CHF/AF- being managed by cardiology- maintained on coumadin. INR therapeutic today.  Rate stable, weight stable.  Lab Results  Component Value Date   INR 2.3 02/21/2019   INR 4.2 (A) 02/13/2019   INR 3.8 (H) 02/12/2019

## 2019-02-21 NOTE — Telephone Encounter (Signed)
[facility] called to report patient's information: PT 24.8/INR 2.32, this information will be faxed

## 2019-02-21 NOTE — Patient Instructions (Addendum)
Please complete lab work prior to leaving.   

## 2019-02-22 DIAGNOSIS — N2581 Secondary hyperparathyroidism of renal origin: Secondary | ICD-10-CM | POA: Diagnosis not present

## 2019-02-22 DIAGNOSIS — N186 End stage renal disease: Secondary | ICD-10-CM | POA: Diagnosis not present

## 2019-02-24 ENCOUNTER — Encounter: Payer: Self-pay | Admitting: Family

## 2019-02-24 NOTE — Telephone Encounter (Signed)
Please see coumadin clinic note

## 2019-02-25 DIAGNOSIS — N186 End stage renal disease: Secondary | ICD-10-CM | POA: Diagnosis not present

## 2019-02-25 DIAGNOSIS — N2581 Secondary hyperparathyroidism of renal origin: Secondary | ICD-10-CM | POA: Diagnosis not present

## 2019-02-26 NOTE — Progress Notes (Signed)
Mailed out to patient 

## 2019-02-27 DIAGNOSIS — N186 End stage renal disease: Secondary | ICD-10-CM | POA: Diagnosis not present

## 2019-02-27 DIAGNOSIS — N2581 Secondary hyperparathyroidism of renal origin: Secondary | ICD-10-CM | POA: Diagnosis not present

## 2019-03-01 DIAGNOSIS — N186 End stage renal disease: Secondary | ICD-10-CM | POA: Diagnosis not present

## 2019-03-01 DIAGNOSIS — N2581 Secondary hyperparathyroidism of renal origin: Secondary | ICD-10-CM | POA: Diagnosis not present

## 2019-03-03 ENCOUNTER — Ambulatory Visit (INDEPENDENT_AMBULATORY_CARE_PROVIDER_SITE_OTHER): Payer: 59 | Admitting: Pharmacist

## 2019-03-03 DIAGNOSIS — I482 Chronic atrial fibrillation, unspecified: Secondary | ICD-10-CM

## 2019-03-03 DIAGNOSIS — Z5181 Encounter for therapeutic drug level monitoring: Secondary | ICD-10-CM

## 2019-03-04 ENCOUNTER — Other Ambulatory Visit: Payer: Self-pay

## 2019-03-04 ENCOUNTER — Inpatient Hospital Stay (HOSPITAL_COMMUNITY)
Admission: EM | Admit: 2019-03-04 | Discharge: 2019-03-06 | DRG: 208 | Disposition: A | Discharge status: Home / Self Care | Payer: Medicare HMO | Attending: Pulmonary Disease | Admitting: Pulmonary Disease

## 2019-03-04 ENCOUNTER — Emergency Department (HOSPITAL_COMMUNITY): Payer: Medicare HMO

## 2019-03-04 ENCOUNTER — Encounter (HOSPITAL_COMMUNITY): Payer: Self-pay

## 2019-03-04 ENCOUNTER — Inpatient Hospital Stay (HOSPITAL_COMMUNITY): Payer: Medicare HMO

## 2019-03-04 DIAGNOSIS — I480 Paroxysmal atrial fibrillation: Secondary | ICD-10-CM | POA: Diagnosis present

## 2019-03-04 DIAGNOSIS — N186 End stage renal disease: Secondary | ICD-10-CM | POA: Diagnosis not present

## 2019-03-04 DIAGNOSIS — J9602 Acute respiratory failure with hypercapnia: Secondary | ICD-10-CM | POA: Diagnosis not present

## 2019-03-04 DIAGNOSIS — I132 Hypertensive heart and chronic kidney disease with heart failure and with stage 5 chronic kidney disease, or end stage renal disease: Secondary | ICD-10-CM | POA: Diagnosis present

## 2019-03-04 DIAGNOSIS — Z833 Family history of diabetes mellitus: Secondary | ICD-10-CM

## 2019-03-04 DIAGNOSIS — I5043 Acute on chronic combined systolic (congestive) and diastolic (congestive) heart failure: Secondary | ICD-10-CM | POA: Diagnosis not present

## 2019-03-04 DIAGNOSIS — Z4682 Encounter for fitting and adjustment of non-vascular catheter: Secondary | ICD-10-CM | POA: Diagnosis not present

## 2019-03-04 DIAGNOSIS — Z9115 Patient's noncompliance with renal dialysis: Secondary | ICD-10-CM | POA: Diagnosis not present

## 2019-03-04 DIAGNOSIS — R0902 Hypoxemia: Secondary | ICD-10-CM | POA: Diagnosis not present

## 2019-03-04 DIAGNOSIS — B20 Human immunodeficiency virus [HIV] disease: Secondary | ICD-10-CM | POA: Diagnosis not present

## 2019-03-04 DIAGNOSIS — I10 Essential (primary) hypertension: Secondary | ICD-10-CM | POA: Diagnosis not present

## 2019-03-04 DIAGNOSIS — J810 Acute pulmonary edema: Secondary | ICD-10-CM

## 2019-03-04 DIAGNOSIS — Z21 Asymptomatic human immunodeficiency virus [HIV] infection status: Secondary | ICD-10-CM | POA: Diagnosis not present

## 2019-03-04 DIAGNOSIS — K219 Gastro-esophageal reflux disease without esophagitis: Secondary | ICD-10-CM | POA: Diagnosis present

## 2019-03-04 DIAGNOSIS — Z992 Dependence on renal dialysis: Secondary | ICD-10-CM

## 2019-03-04 DIAGNOSIS — M109 Gout, unspecified: Secondary | ICD-10-CM | POA: Diagnosis present

## 2019-03-04 DIAGNOSIS — Z888 Allergy status to other drugs, medicaments and biological substances status: Secondary | ICD-10-CM

## 2019-03-04 DIAGNOSIS — Z8673 Personal history of transient ischemic attack (TIA), and cerebral infarction without residual deficits: Secondary | ICD-10-CM | POA: Diagnosis not present

## 2019-03-04 DIAGNOSIS — D631 Anemia in chronic kidney disease: Secondary | ICD-10-CM | POA: Diagnosis not present

## 2019-03-04 DIAGNOSIS — E785 Hyperlipidemia, unspecified: Secondary | ICD-10-CM | POA: Diagnosis not present

## 2019-03-04 DIAGNOSIS — J811 Chronic pulmonary edema: Secondary | ICD-10-CM

## 2019-03-04 DIAGNOSIS — J9601 Acute respiratory failure with hypoxia: Secondary | ICD-10-CM | POA: Diagnosis not present

## 2019-03-04 DIAGNOSIS — Z8349 Family history of other endocrine, nutritional and metabolic diseases: Secondary | ICD-10-CM

## 2019-03-04 DIAGNOSIS — H409 Unspecified glaucoma: Secondary | ICD-10-CM | POA: Diagnosis present

## 2019-03-04 DIAGNOSIS — R0689 Other abnormalities of breathing: Secondary | ICD-10-CM | POA: Diagnosis not present

## 2019-03-04 DIAGNOSIS — Z7901 Long term (current) use of anticoagulants: Secondary | ICD-10-CM | POA: Diagnosis not present

## 2019-03-04 DIAGNOSIS — Z9841 Cataract extraction status, right eye: Secondary | ICD-10-CM | POA: Diagnosis not present

## 2019-03-04 DIAGNOSIS — Z8249 Family history of ischemic heart disease and other diseases of the circulatory system: Secondary | ICD-10-CM

## 2019-03-04 DIAGNOSIS — Z87891 Personal history of nicotine dependence: Secondary | ICD-10-CM | POA: Diagnosis not present

## 2019-03-04 DIAGNOSIS — R0602 Shortness of breath: Secondary | ICD-10-CM | POA: Diagnosis not present

## 2019-03-04 DIAGNOSIS — Z79899 Other long term (current) drug therapy: Secondary | ICD-10-CM

## 2019-03-04 DIAGNOSIS — I351 Nonrheumatic aortic (valve) insufficiency: Secondary | ICD-10-CM | POA: Diagnosis present

## 2019-03-04 DIAGNOSIS — I959 Hypotension, unspecified: Secondary | ICD-10-CM | POA: Diagnosis not present

## 2019-03-04 DIAGNOSIS — I429 Cardiomyopathy, unspecified: Secondary | ICD-10-CM

## 2019-03-04 DIAGNOSIS — R000 Tachycardia, unspecified: Secondary | ICD-10-CM | POA: Diagnosis not present

## 2019-03-04 DIAGNOSIS — R739 Hyperglycemia, unspecified: Secondary | ICD-10-CM | POA: Diagnosis not present

## 2019-03-04 DIAGNOSIS — Z8546 Personal history of malignant neoplasm of prostate: Secondary | ICD-10-CM | POA: Diagnosis not present

## 2019-03-04 DIAGNOSIS — Z9842 Cataract extraction status, left eye: Secondary | ICD-10-CM | POA: Diagnosis not present

## 2019-03-04 DIAGNOSIS — Z8674 Personal history of sudden cardiac arrest: Secondary | ICD-10-CM

## 2019-03-04 DIAGNOSIS — G4733 Obstructive sleep apnea (adult) (pediatric): Secondary | ICD-10-CM | POA: Diagnosis present

## 2019-03-04 DIAGNOSIS — J9690 Respiratory failure, unspecified, unspecified whether with hypoxia or hypercapnia: Secondary | ICD-10-CM

## 2019-03-04 DIAGNOSIS — R0603 Acute respiratory distress: Secondary | ICD-10-CM | POA: Diagnosis not present

## 2019-03-04 DIAGNOSIS — I2510 Atherosclerotic heart disease of native coronary artery without angina pectoris: Secondary | ICD-10-CM | POA: Diagnosis present

## 2019-03-04 DIAGNOSIS — I428 Other cardiomyopathies: Secondary | ICD-10-CM | POA: Diagnosis not present

## 2019-03-04 DIAGNOSIS — R7989 Other specified abnormal findings of blood chemistry: Secondary | ICD-10-CM | POA: Diagnosis not present

## 2019-03-04 LAB — ECHOCARDIOGRAM LIMITED
Height: 74 in
Weight: 2945.35 oz

## 2019-03-04 LAB — RESPIRATORY PANEL BY PCR
Adenovirus: NOT DETECTED
Bordetella pertussis: NOT DETECTED
CHLAMYDOPHILA PNEUMONIAE-RVPPCR: NOT DETECTED
Coronavirus 229E: NOT DETECTED
Coronavirus HKU1: NOT DETECTED
Coronavirus NL63: NOT DETECTED
Coronavirus OC43: NOT DETECTED
Influenza A: NOT DETECTED
Influenza B: NOT DETECTED
Metapneumovirus: NOT DETECTED
Mycoplasma pneumoniae: NOT DETECTED
Parainfluenza Virus 1: NOT DETECTED
Parainfluenza Virus 2: NOT DETECTED
Parainfluenza Virus 3: NOT DETECTED
Parainfluenza Virus 4: NOT DETECTED
RESPIRATORY SYNCYTIAL VIRUS-RVPPCR: NOT DETECTED
Rhinovirus / Enterovirus: NOT DETECTED

## 2019-03-04 LAB — CBC WITH DIFFERENTIAL/PLATELET
Abs Immature Granulocytes: 0.04 10*3/uL (ref 0.00–0.07)
Basophils Absolute: 0.1 10*3/uL (ref 0.0–0.1)
Basophils Relative: 1 %
Eosinophils Absolute: 0.2 10*3/uL (ref 0.0–0.5)
Eosinophils Relative: 2 %
HCT: 44.8 % (ref 39.0–52.0)
Hemoglobin: 13.8 g/dL (ref 13.0–17.0)
Immature Granulocytes: 0 %
Lymphocytes Relative: 36 %
Lymphs Abs: 4.7 10*3/uL — ABNORMAL HIGH (ref 0.7–4.0)
MCH: 34.8 pg — ABNORMAL HIGH (ref 26.0–34.0)
MCHC: 30.8 g/dL (ref 30.0–36.0)
MCV: 113.1 fL — ABNORMAL HIGH (ref 80.0–100.0)
Monocytes Absolute: 0.7 10*3/uL (ref 0.1–1.0)
Monocytes Relative: 6 %
Neutro Abs: 7.1 10*3/uL (ref 1.7–7.7)
Neutrophils Relative %: 55 %
Platelets: 294 10*3/uL (ref 150–400)
RBC: 3.96 MIL/uL — ABNORMAL LOW (ref 4.22–5.81)
RDW: 15.5 % (ref 11.5–15.5)
WBC: 12.9 10*3/uL — ABNORMAL HIGH (ref 4.0–10.5)
nRBC: 0 % (ref 0.0–0.2)

## 2019-03-04 LAB — POCT I-STAT 7, (LYTES, BLD GAS, ICA,H+H)
Acid-Base Excess: 4 mmol/L — ABNORMAL HIGH (ref 0.0–2.0)
Acid-Base Excess: 5 mmol/L — ABNORMAL HIGH (ref 0.0–2.0)
Bicarbonate: 30.7 mmol/L — ABNORMAL HIGH (ref 20.0–28.0)
Bicarbonate: 29 mmol/L — ABNORMAL HIGH (ref 20.0–28.0)
CALCIUM ION: 1.13 mmol/L — AB (ref 1.15–1.40)
Calcium, Ion: 1.06 mmol/L — ABNORMAL LOW (ref 1.15–1.40)
HCT: 41 % (ref 39.0–52.0)
HCT: 37 % — ABNORMAL LOW (ref 39.0–52.0)
Hemoglobin: 13.9 g/dL (ref 13.0–17.0)
Hemoglobin: 12.6 g/dL — ABNORMAL LOW (ref 13.0–17.0)
O2 SAT: 100 %
O2 Saturation: 100 %
PCO2 ART: 38.6 mmHg (ref 32.0–48.0)
POTASSIUM: 4.5 mmol/L (ref 3.5–5.1)
Patient temperature: 97.8
Patient temperature: 97.9
Potassium: 4.3 mmol/L (ref 3.5–5.1)
Sodium: 138 mmol/L (ref 135–145)
Sodium: 138 mmol/L (ref 135–145)
TCO2: 32 mmol/L (ref 22–32)
TCO2: 30 mmol/L (ref 22–32)
pCO2 arterial: 50 mmHg — ABNORMAL HIGH (ref 32.0–48.0)
pH, Arterial: 7.394 (ref 7.350–7.450)
pH, Arterial: 7.482 — ABNORMAL HIGH (ref 7.350–7.450)
pO2, Arterial: 406 mmHg — ABNORMAL HIGH (ref 83.0–108.0)
pO2, Arterial: 190 mmHg — ABNORMAL HIGH (ref 83.0–108.0)

## 2019-03-04 LAB — GLUCOSE, CAPILLARY
Glucose-Capillary: 96 mg/dL (ref 70–99)
Glucose-Capillary: 91 mg/dL (ref 70–99)
Glucose-Capillary: 84 mg/dL (ref 70–99)
Glucose-Capillary: 89 mg/dL (ref 70–99)

## 2019-03-04 LAB — COMPREHENSIVE METABOLIC PANEL
ALT: 28 U/L (ref 0–44)
AST: 30 U/L (ref 15–41)
Albumin: 4.2 g/dL (ref 3.5–5.0)
Alkaline Phosphatase: 116 U/L (ref 38–126)
Anion gap: 25 — ABNORMAL HIGH (ref 5–15)
BUN: 72 mg/dL — ABNORMAL HIGH (ref 8–23)
CHLORIDE: 96 mmol/L — AB (ref 98–111)
CO2: 19 mmol/L — ABNORMAL LOW (ref 22–32)
Calcium: 9.4 mg/dL (ref 8.9–10.3)
Creatinine, Ser: 11.44 mg/dL — ABNORMAL HIGH (ref 0.61–1.24)
GFR calc Af Amer: 4 mL/min — ABNORMAL LOW (ref >60)
GFR calc non Af Amer: 4 mL/min — ABNORMAL LOW (ref >60)
Glucose, Bld: 236 mg/dL — ABNORMAL HIGH (ref 70–99)
POTASSIUM: 4 mmol/L (ref 3.5–5.1)
Sodium: 140 mmol/L (ref 135–145)
Total Bilirubin: 0.8 mg/dL (ref 0.3–1.2)
Total Protein: 8.3 g/dL — ABNORMAL HIGH (ref 6.5–8.1)

## 2019-03-04 LAB — PROTIME-INR
INR: 2 — ABNORMAL HIGH (ref 0.8–1.2)
Prothrombin Time: 22.1 seconds — ABNORMAL HIGH (ref 11.4–15.2)

## 2019-03-04 LAB — TROPONIN I: Troponin I: 0.08 ng/mL (ref <0.03)

## 2019-03-04 LAB — BRAIN NATRIURETIC PEPTIDE: B Natriuretic Peptide: 1041.5 pg/mL — ABNORMAL HIGH (ref 0.0–100.0)

## 2019-03-04 MED ORDER — PROPOFOL 1000 MG/100ML IV EMUL
INTRAVENOUS | Status: AC
  Filled 2019-03-04: qty 100

## 2019-03-04 MED ORDER — DORZOLAMIDE HCL-TIMOLOL MAL 2-0.5 % OP SOLN
1.0000 [drp] | Freq: Two times a day (BID) | OPHTHALMIC | Status: DC
  Administered 2019-03-04 – 2019-03-06 (×4): 1 [drp] via OPHTHALMIC
  Filled 2019-03-04 (×2): qty 10

## 2019-03-04 MED ORDER — PANTOPRAZOLE SODIUM 40 MG IV SOLR
40.0000 mg | Freq: Every day | INTRAVENOUS | Status: DC
  Administered 2019-03-04 – 2019-03-05 (×2): 40 mg via INTRAVENOUS
  Filled 2019-03-04 (×2): qty 40

## 2019-03-04 MED ORDER — AMIODARONE HCL 200 MG PO TABS
200.0000 mg | ORAL_TABLET | Freq: Every day | ORAL | Status: DC
  Administered 2019-03-04 – 2019-03-06 (×3): 200 mg via ORAL
  Filled 2019-03-04 (×3): qty 1

## 2019-03-04 MED ORDER — ACETAMINOPHEN 500 MG PO TABS
1000.0000 mg | ORAL_TABLET | Freq: Four times a day (QID) | ORAL | Status: DC | PRN
  Administered 2019-03-06: 1000 mg via ORAL
  Filled 2019-03-04: qty 2

## 2019-03-04 MED ORDER — PERFLUTREN LIPID MICROSPHERE
1.0000 mL | INTRAVENOUS | Status: AC | PRN
  Administered 2019-03-04: 2 mL via INTRAVENOUS
  Filled 2019-03-04: qty 10

## 2019-03-04 MED ORDER — CHLORHEXIDINE GLUCONATE CLOTH 2 % EX PADS
6.0000 | MEDICATED_PAD | Freq: Every day | CUTANEOUS | Status: DC
  Administered 2019-03-04 – 2019-03-05 (×2): 6 via TOPICAL

## 2019-03-04 MED ORDER — FENTANYL BOLUS VIA INFUSION
25.0000 ug | INTRAVENOUS | Status: DC | PRN
  Filled 2019-03-04: qty 25

## 2019-03-04 MED ORDER — ONDANSETRON HCL 4 MG/2ML IJ SOLN
4.0000 mg | Freq: Four times a day (QID) | INTRAMUSCULAR | Status: DC | PRN
  Administered 2019-03-04 – 2019-03-05 (×2): 4 mg via INTRAVENOUS
  Filled 2019-03-04 (×2): qty 2

## 2019-03-04 MED ORDER — ALLOPURINOL 100 MG PO TABS
100.0000 mg | ORAL_TABLET | Freq: Every day | ORAL | Status: DC
  Administered 2019-03-04 – 2019-03-06 (×3): 100 mg via ORAL
  Filled 2019-03-04 (×3): qty 1

## 2019-03-04 MED ORDER — CHLORHEXIDINE GLUCONATE 0.12% ORAL RINSE (MEDLINE KIT)
15.0000 mL | Freq: Two times a day (BID) | OROMUCOSAL | Status: DC
  Administered 2019-03-04 – 2019-03-05 (×2): 15 mL via OROMUCOSAL

## 2019-03-04 MED ORDER — FENTANYL 2500MCG IN NS 250ML (10MCG/ML) PREMIX INFUSION
0.0000 ug/h | INTRAVENOUS | Status: DC
  Administered 2019-03-04: 50 ug/h via INTRAVENOUS
  Filled 2019-03-04: qty 250

## 2019-03-04 MED ORDER — SUCCINYLCHOLINE CHLORIDE 20 MG/ML IJ SOLN
INTRAMUSCULAR | Status: AC | PRN
  Administered 2019-03-04: 120 mg via INTRAVENOUS

## 2019-03-04 MED ORDER — ALBUMIN HUMAN 25 % IV SOLN
12.5000 g | Freq: Once | INTRAVENOUS | Status: AC
  Administered 2019-03-04: 12.5 g via INTRAVENOUS
  Filled 2019-03-04: qty 50

## 2019-03-04 MED ORDER — FUROSEMIDE 10 MG/ML IJ SOLN
40.0000 mg | Freq: Once | INTRAMUSCULAR | Status: AC
  Administered 2019-03-04: 40 mg via INTRAVENOUS

## 2019-03-04 MED ORDER — NITROGLYCERIN 2 % TD OINT
1.0000 [in_us] | TOPICAL_OINTMENT | Freq: Once | TRANSDERMAL | Status: AC
  Administered 2019-03-04: 1 [in_us] via TOPICAL
  Filled 2019-03-04: qty 1

## 2019-03-04 MED ORDER — BRIMONIDINE TARTRATE 0.2 % OP SOLN
1.0000 [drp] | Freq: Two times a day (BID) | OPHTHALMIC | Status: DC
  Administered 2019-03-04 – 2019-03-06 (×4): 1 [drp] via OPHTHALMIC
  Filled 2019-03-04 (×2): qty 5

## 2019-03-04 MED ORDER — FUROSEMIDE 10 MG/ML IJ SOLN
INTRAMUSCULAR | Status: AC
  Filled 2019-03-04: qty 4

## 2019-03-04 MED ORDER — MIDODRINE HCL 5 MG PO TABS
10.0000 mg | ORAL_TABLET | ORAL | Status: DC
  Administered 2019-03-04 – 2019-03-06 (×4): 10 mg via ORAL
  Filled 2019-03-04 (×3): qty 2

## 2019-03-04 MED ORDER — ONDANSETRON HCL 4 MG/2ML IJ SOLN
4.0000 mg | Freq: Once | INTRAMUSCULAR | Status: AC
  Administered 2019-03-04: 4 mg via INTRAVENOUS
  Filled 2019-03-04: qty 2

## 2019-03-04 MED ORDER — ORAL CARE MOUTH RINSE
15.0000 mL | OROMUCOSAL | Status: DC
  Administered 2019-03-04 – 2019-03-05 (×6): 15 mL via OROMUCOSAL

## 2019-03-04 MED ORDER — HEPARIN SODIUM (PORCINE) 1000 UNIT/ML DIALYSIS
2800.0000 [IU] | Freq: Once | INTRAMUSCULAR | Status: AC
  Administered 2019-03-04: 2800 [IU] via INTRAVENOUS_CENTRAL

## 2019-03-04 MED ORDER — FENTANYL 2500MCG IN NS 250ML (10MCG/ML) PREMIX INFUSION
25.0000 ug/h | INTRAVENOUS | Status: DC
  Administered 2019-03-04: 100 ug/h via INTRAVENOUS

## 2019-03-04 MED ORDER — ALBUTEROL SULFATE (2.5 MG/3ML) 0.083% IN NEBU: 2.5000 mg | INHALATION_SOLUTION | RESPIRATORY_TRACT | Status: DC | PRN

## 2019-03-04 MED ORDER — BICTEGRAVIR-EMTRICITAB-TENOFOV 50-200-25 MG PO TABS
1.0000 | ORAL_TABLET | Freq: Every day | ORAL | Status: DC
  Administered 2019-03-04 – 2019-03-06 (×3): 1 via ORAL
  Filled 2019-03-04 (×3): qty 1

## 2019-03-04 MED ORDER — PROPOFOL 1000 MG/100ML IV EMUL
INTRAVENOUS | Status: AC | PRN
  Administered 2019-03-04: 39.078 ug/kg/min via INTRAVENOUS

## 2019-03-04 MED ORDER — LATANOPROST 0.005 % OP SOLN
1.0000 [drp] | Freq: Every day | OPHTHALMIC | Status: DC
  Administered 2019-03-04 – 2019-03-05 (×3): 1 [drp] via OPHTHALMIC
  Filled 2019-03-04 (×2): qty 2.5

## 2019-03-04 MED ORDER — ETOMIDATE 2 MG/ML IV SOLN
INTRAVENOUS | Status: AC | PRN
  Administered 2019-03-04: 30 mg via INTRAVENOUS

## 2019-03-04 MED ORDER — FENTANYL CITRATE (PF) 100 MCG/2ML IJ SOLN: 50.0000 ug | Freq: Once | INTRAMUSCULAR | Status: DC

## 2019-03-04 MED ORDER — INSULIN ASPART 100 UNIT/ML ~~LOC~~ SOLN: 0.0000 [IU] | SUBCUTANEOUS | Status: DC

## 2019-03-04 NOTE — ED Notes (Signed)
ED TO INPATIENT HANDOFF REPORT  ED Nurse Name and Phone #: Jess F   S Name/Age/Gender [place] 78 y.o. male Room/Bed: 024C/024C  Code Status   Code Status: Prior  Home/SNF/Other Home Patient oriented to: self, place, time and situation Is this baseline? Yes   Triage Complete: Triage complete  Chief Complaint [facility]  Triage Note Pt comes via [facility] for SOB that started at about 230, dialysis pt, has not missed any treatments. Rales in all lobes.    Allergies Allergies  Allergen Reactions  . Cozaar [Losartan Potassium] Other (See Comments)    Causes constipation     Level of Care/Admitting Diagnosis ED Disposition    ED Disposition Condition [facility] Area: [place] [[id number]]  Level of Care: ICU [6]  Diagnosis: Acute respiratory failure with hypoxia and hypercapnia [facility]) [[id number]]  Admitting Physician: Roxanne Mins [[id number]]  Attending Physician: Roxanne Mins [[id number]]  Estimated length of stay: 5 - 7 days  Certification:: I certify this patient will need inpatient services for at least 2 midnights  PT Class (Do Not Modify): Inpatient [101]  PT Acc Code (Do Not Modify): Private [1]       B Medical/Surgery History Past Medical History:  Diagnosis Date  . Acute on chronic systolic and diastolic heart failure, NYHA class 4 ([place])   . Anemia, iron deficiency 11/15/2011  . Arthritis    "hands, right knee, feet" (02/21/2018)  . Atrial fibrillation ([place])   . Cancer [facility])    hx of prostate; s/p radioactive seed implant 10/2009 Dr Janice Norrie  . Cardiac arrest ([place]) 02/17/2018  . CHF (congestive heart failure) ([place])   . Chronic lower back pain   . CKD (chronic kidney disease) stage V requiring chronic dialysis ([place])   . ESRD on dialysis ([place])    started 02/2018  . GERD (gastroesophageal reflux disease)   . Glaucoma   . Gout    daily RX (02/21/2018)  . Heart murmur    "mild" per pt  . Hepatitis    years ago  .  History of cardiac cath 2004   negative for CAD  . History of myocardial perfusion scan 02/2010   negative for coronary insufficiency (LVEF 27%)  . HIV (human immunodeficiency virus infection) ([place])   . HIV infection ([place])   . Hyperlipidemia   . Hypertension    followed by [facility] and Vascular (Dr Dani Gobble Croitoru)  . Hypertension   . Nonischemic cardiomyopathy ([place])   . Pneumonia 11/2017  . Prostate cancer ([place])   . Sinus bradycardia   . Sleep apnea    does not use a cpap  . Stroke [facility])    "mini stroke" years ago  . Systolic and diastolic CHF, acute ([place]) 06/2010   felt to be secondary to hypertensive cardiomyopathy   Past Surgical History:  Procedure Laterality Date  . AV FISTULA PLACEMENT Right 10/13/2016   Procedure: ARTERIOVENOUS (AV) FISTULA CREATION;  Surgeon: [name], MD;  Location: [place];  Service: Vascular;  Laterality: Right;  . AV FISTULA PLACEMENT Right 10/13/2016   [name] [phone]  . AV FISTULA PLACEMENT Left 02/25/2018   Procedure: INSERTION OF ARTERIOVENOUS (AV) GORE-TEX GRAFT LEFT UPPER ARM;  Surgeon: Angelia Mould, MD;  Location: [place];  Service: Vascular;  Laterality: Left;  . AV FISTULA PLACEMENT Right 08/07/2018   Procedure: Creation of right arm brachiocephalic Fistula;  Surgeon: Waynetta Sandy, MD;  Location: [place];  Service: Vascular;  Laterality: Right;  . BASCILIC VEIN TRANSPOSITION Left 06/24/2015   Procedure: BASILIC VEIN TRANSPOSITION;  Surgeon: Mal Misty, MD;  Location: [place];  Service: Vascular;  Laterality: Left;  . BASCILIC VEIN TRANSPOSITION Left 9/93/7169 Bascilic vein transposition (Left)   [name] [phone]  . CARDIAC CATHETERIZATION  02/04/2003   mildly depressed LV systolic fx EF 75%,ZWCHEN coronaries/abdominal aorta/renal arteries.  Marland Kitchen CARDIAC CATHETERIZATION  2004  . CATARACT EXTRACTION, BILATERAL Bilateral   . COLONOSCOPY    . ESOPHAGOGASTRODUODENOSCOPY (EGD) WITH PROPOFOL N/A 05/05/2014   Procedure:  ESOPHAGOGASTRODUODENOSCOPY (EGD) WITH PROPOFOL;  Surgeon: Missy Sabins, MD;  [facility];  Service: Endoscopy;  Laterality: N/A;  . EYE SURGERY Bilateral    cataract surgery   . EYE SURGERY Bilateral    glaucoma surgery  . EYE SURGERY    . GLAUCOMA SURGERY Bilateral   . INSERTION OF ARTERIOVENOUS (AV) ARTEGRAFT ARM  10/09/2018   Procedure: INSERTION OF ARTERIOVENOUS (AV) 55mm x 41cm ARTEGRAFT LEFT UPPER ARM;  Surgeon: Waynetta Sandy, MD;  Location: [place];  Service: Vascular;;  . INSERTION PROSTATE RADIATION SEED    . IR FLUORO GUIDE CV LINE RIGHT  02/18/2018  . IR REMOVAL TUN CV CATH W/O FL  12/06/2018  . IR US GUIDE VASC ACCESS RIGHT  02/18/2018  . LEFT HEART CATH AND CORONARY ANGIOGRAPHY N/A 02/20/2018   Procedure: LEFT HEART CATH AND CORONARY ANGIOGRAPHY;  Surgeon: Jettie Booze, MD;  Location: [place] CV LAB;  Service: Cardiovascular;  Laterality: N/A;  . NM MYOCAR PERF WALL MOTION  02/21/2010   normal  . RADIOACTIVE SEED IMPLANT  2010   prostate cancer  . THROMBECTOMY W/ EMBOLECTOMY Left 02/26/2018   Procedure: THROMBECTOMY ARTERIOVENOUS GRAFT;  Surgeon: Angelia Mould, MD;  Location: [place];  Service: Vascular;  Laterality: Left;  . ULTRASOUND GUIDANCE FOR VASCULAR ACCESS  02/20/2018   Procedure: Ultrasound Guidance For Vascular Access;  Surgeon: Jettie Booze, MD;  Location: [place] CV LAB;  Service: Cardiovascular;;  . UPPER EXTREMITY VENOGRAPHY N/A 07/08/2018   Procedure: UPPER EXTREMITY VENOGRAPHY;  Surgeon: Waynetta Sandy, MD;  Location: [place] CV LAB;  Service: Cardiovascular;  Laterality: N/A;  Bilateral  . US ECHOCARDIOGRAPHY  02/06/2012   mild LVH,LA mod. dilated,mild-mod. 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 AI.     A IV Location/Drains/Wounds Patient Lines/Drains/Airways Status   Active Line/Drains/Airways    Name:   Placement date:   Placement time:   Site:   Days:   Peripheral IV  03/04/19 Left Other (Comment)   03/04/19    0511    Other (Comment)   less than 1   NG/OG Tube Orogastric 18 Fr. Right mouth Xray;Aucultation   03/04/19    0600    Right mouth   less than 1   Airway 8 mm   03/04/19    0556     less than 1          Intake/Output Last 24 hours No intake or output data in the 24 hours ending 03/04/19 0707  Labs/Imaging Results for orders placed or performed during the hospital encounter of 03/04/19 (from the past 48 hour(s))  CBC with Differential     Status: Abnormal   Collection Time: 03/04/19  5:14 AM  Result Value Ref Range   WBC 12.9 (H) 4.0 - 10.5 K/uL   RBC 3.96 (L) 4.22 - 5.81 MIL/uL   Hemoglobin 13.8 13.0 - 17.0 g/dL  HCT 44.8 39.0 - 52.0 %   MCV 113.1 (H) 80.0 - 100.0 fL   MCH 34.8 (H) 26.0 - 34.0 pg   MCHC 30.8 30.0 - 36.0 g/dL   RDW 15.5 11.5 - 15.5 %   Platelets 294 150 - 400 K/uL   nRBC 0.0 0.0 - 0.2 %   Neutrophils Relative % 55 %   Neutro Abs 7.1 1.7 - 7.7 K/uL   Lymphocytes Relative 36 %   Lymphs Abs 4.7 (H) 0.7 - 4.0 K/uL   Monocytes Relative 6 %   Monocytes Absolute 0.7 0.1 - 1.0 K/uL   Eosinophils Relative 2 %   Eosinophils Absolute 0.2 0.0 - 0.5 K/uL   Basophils Relative 1 %   Basophils Absolute 0.1 0.0 - 0.1 K/uL   Immature Granulocytes 0 %   Abs Immature Granulocytes 0.04 0.00 - 0.07 K/uL   Polychromasia PRESENT     Comment: Performed at [facility] [address], [place], [place] [address]  Comprehensive metabolic panel     Status: Abnormal   Collection Time: 03/04/19  5:14 AM  Result Value Ref Range   Sodium 140 135 - 145 mmol/L   Potassium 4.0 3.5 - 5.1 mmol/L   Chloride 96 (L) 98 - 111 mmol/L   CO2 19 (L) 22 - 32 mmol/L   Glucose, Bld 236 (H) 70 - 99 mg/dL   BUN 72 (H) 8 - 23 mg/dL   Creatinine, Ser 11.44 (H) 0.61 - 1.24 mg/dL   Calcium 9.4 8.9 - 10.3 mg/dL   Total Protein 8.3 (H) 6.5 - 8.1 g/dL   Albumin 4.2 3.5 - 5.0 g/dL   AST 30 15 - 41 U/L   ALT 28 0 - 44 U/L   Alkaline Phosphatase 116 38 -  126 U/L   Total Bilirubin 0.8 0.3 - 1.2 mg/dL   GFR calc non Af Amer 4 (L) >60 mL/min   GFR calc Af Amer 4 (L) >60 mL/min   Anion gap 25 (H) 5 - 15    Comment: Performed at [facility], [place] [address] [place], [place] [address]  Troponin I - ONCE - STAT     Status: Abnormal   Collection Time: 03/04/19  5:14 AM  Result Value Ref Range   Troponin I 0.08 (HH) <0.03 ng/mL    Comment: CRITICAL RESULT CALLED TO, READ BACK BY AND VERIFIED WITH: Billie Lade 03/04/19 [id number] WAYK Performed at [facility] [address], [place], [place] [address]    Dg Chest Portable 1 View  Result Date: 03/04/2019 CLINICAL DATA:  Respiratory distress EXAM: PORTABLE CHEST 1 VIEW COMPARISON:  02/10/2019 FINDINGS: Endotracheal tube tip just below the clavicular heads. The orogastric tube reaches the stomach at least. Cardiomegaly with diffuse interstitial and airspace opacity. The airspace opacity is symmetric and there are [employer]. IMPRESSION: 1. Hardware in unremarkable position. 2. CHF pattern. Electronically Signed   By: Monte Fantasia M.D.   On: 03/04/2019 06:23    Pending Labs Unresulted Labs (From admission, onward)    Start     Ordered   03/04/19 0519  Brain natriuretic peptide  Once,   R     03/04/19 0518   Signed and Held  Blood gas, arterial  ONCE - STAT,   R     Signed and Held   Signed and Held  CBC  Tomorrow morning,   R     Signed and Held   Signed and Held  Blood gas, arterial  Tomorrow morning,   R     Signed and Held   Signed and Held  Magnesium  Tomorrow morning,   R     Signed and Held   Signed and Held  Phosphorus  Tomorrow morning,   R     Signed and Held   Signed and Held  Basic metabolic panel  Tomorrow morning,   R     Signed and Held   Signed and Held  Respiratory Panel by PCR  (Respiratory virus panel with precautions)  Once,   R     Signed and Held          Vitals/Pain Today's Vitals   03/04/19 0545 03/04/19 0600 03/04/19 0619 03/04/19 0630  BP:  (!) 168/95  112/76 104/66  Pulse:   (!) 104   Resp: (!) 37  20 18  Temp:      TempSrc:      SpO2:   100%   Height:  6\' 2"  (1.88 m)    PainSc:        Isolation Precautions No active isolations  Medications Medications  propofol (DIPRIVAN) 1000 MG/100ML infusion ( Intravenous Canceled Entry 03/04/19 0615)  insulin aspart (novoLOG) injection 0-9 Units (has no administration in time range)  nitroGLYCERIN (NITROGLYN) 2 % ointment 1 inch (1 inch Topical Given 03/04/19 0519)  furosemide (LASIX) injection 40 mg (40 mg Intravenous Given 03/04/19 0519)  ondansetron (ZOFRAN) injection 4 mg (4 mg Intravenous Given 03/04/19 0551)  etomidate (AMIDATE) injection (30 mg Intravenous Given 03/04/19 0553)  succinylcholine (ANECTINE) injection (120 mg Intravenous Given 03/04/19 0553)    Mobility walks Low fall risk   Focused Assessments Pulmonary Assessment Handoff:  Lung sounds: Bilateral Breath Sounds: Rhonchi L Breath Sounds: Rales R Breath Sounds: Rales O2 Device: Ventilator        R Recommendations: See Admitting Provider Note  Report given to:   Additional Notes: n/a

## 2019-03-04 NOTE — Consult Note (Signed)
Renal Service Consult Note [facility]  [facility] 03/04/2019 Sol Blazing Requesting Physician:  Dr Claudie Leach  Reason for Consult:  ESRD pt w/ acute resp failure and pulm edema HPI: The patient is a 78 y.o. year-old with hx of syst CHF/ EF 25%, HTN, ESRD, HIV, afib presented to ED this am w/ SOB and resp distress, did not miss any HD session.  When attempting to put pt on bipap he vomited and had to be intubated. CXR showed pulm edema. Asked to see for dialysis.    Patient on vent, alert and responsive, SOB came on all of a sudden , no recent URI/ fever, prod sputum or chest pain.  On HD x 1 year approximately.  Pt was here 2 wks ago 3/9- 3/11 with acute resp distress and pulm edema, question PNA. He was rx'd with HD -2L x 1 and improved, post HD wt was 84.5kg.  Trop was 0.04, bnp was 1054. No fever , wbc 10k , dc'd on empiric augmentin after 2d IV abx in house.    ROS  denies CP  no joint pain   no HA  no blurry vision  no rash  no diarrhea  no nausea/ vomiting  no dysuria   Past Medical History  Past Medical History:  Diagnosis Date  . Acute on chronic systolic and diastolic heart failure, NYHA class 4 ([place])   . Anemia, iron deficiency 11/15/2011  . Arthritis    "hands, right knee, feet" (02/21/2018)  . Atrial fibrillation ([place])   . Cancer [facility])    hx of prostate; s/p radioactive seed implant 10/2009 Dr Janice Norrie  . Cardiac arrest ([place]) 02/17/2018  . CHF (congestive heart failure) ([place])   . Chronic lower back pain   . CKD (chronic kidney disease) stage V requiring chronic dialysis ([place])   . ESRD on dialysis ([place])    started 02/2018  . GERD (gastroesophageal reflux disease)   . Glaucoma   . Gout    daily RX (02/21/2018)  . Heart murmur    "mild" per pt  . Hepatitis    years ago  . History of cardiac cath 2004   negative for CAD  . History of myocardial perfusion scan 02/2010   negative for coronary insufficiency (LVEF 27%)  . HIV (human  immunodeficiency virus infection) ([place])   . HIV infection ([place])   . Hyperlipidemia   . Hypertension    followed by [facility] (Dr Dani Gobble Croitoru)  . Hypertension   . Nonischemic cardiomyopathy ([place])   . Pneumonia 11/2017  . Prostate cancer ([place])   . Sinus bradycardia   . Sleep apnea    does not use a cpap  . Stroke [facility])    "mini stroke" years ago  . Systolic and diastolic CHF, acute ([place]) 06/2010   felt to be secondary to hypertensive cardiomyopathy   Past Surgical History  Past Surgical History:  Procedure Laterality Date  . AV FISTULA PLACEMENT Right 10/13/2016   Procedure: ARTERIOVENOUS (AV) FISTULA CREATION;  Surgeon: [name], MD;  Location: [place];  Service: Vascular;  Laterality: Right;  . AV FISTULA PLACEMENT Right 10/13/2016   [name] [phone]  . AV FISTULA PLACEMENT Left 02/25/2018   Procedure: INSERTION OF ARTERIOVENOUS (AV) GORE-TEX GRAFT LEFT UPPER ARM;  Surgeon: Angelia Mould, MD;  Location: [place];  Service: Vascular;  Laterality: Left;  . AV FISTULA PLACEMENT Right 08/07/2018   Procedure: Creation of right arm brachiocephalic Fistula;  Surgeon:  Waynetta Sandy, MD;  Location: [place];  Service: Vascular;  Laterality: Right;  . BASCILIC VEIN TRANSPOSITION Left 06/24/2015   Procedure: BASILIC VEIN TRANSPOSITION;  Surgeon: Mal Misty, MD;  Location: [place];  Service: Vascular;  Laterality: Left;  . BASCILIC VEIN TRANSPOSITION Left 08/04/5175 Bascilic vein transposition (Left)   [name] [id number]  . CARDIAC CATHETERIZATION  02/04/2003   mildly depressed LV systolic fx EF 26%,RSWNIO coronaries/abdominal aorta/renal arteries.  Marland Kitchen CARDIAC CATHETERIZATION  2004  . CATARACT EXTRACTION, BILATERAL Bilateral   . COLONOSCOPY    . ESOPHAGOGASTRODUODENOSCOPY (EGD) WITH PROPOFOL N/A 05/05/2014   Procedure: ESOPHAGOGASTRODUODENOSCOPY (EGD) WITH PROPOFOL;  Surgeon: Missy Sabins, MD;  Location: [facility];  Service: Endoscopy;  Laterality: N/A;  . EYE  SURGERY Bilateral    cataract surgery   . EYE SURGERY Bilateral    glaucoma surgery  . EYE SURGERY    . GLAUCOMA SURGERY Bilateral   . INSERTION OF ARTERIOVENOUS (AV) ARTEGRAFT ARM  10/09/2018   Procedure: INSERTION OF ARTERIOVENOUS (AV) 9mm x 41cm ARTEGRAFT LEFT UPPER ARM;  Surgeon: Waynetta Sandy, MD;  Location: [place];  Service: Vascular;;  . INSERTION PROSTATE RADIATION SEED    . IR FLUORO GUIDE CV LINE RIGHT  02/18/2018  . IR REMOVAL TUN CV CATH W/O FL  12/06/2018  . IR US GUIDE VASC ACCESS RIGHT  02/18/2018  . LEFT HEART CATH AND CORONARY ANGIOGRAPHY N/A 02/20/2018   Procedure: LEFT HEART CATH AND CORONARY ANGIOGRAPHY;  Surgeon: Jettie Booze, MD;  Location: [place] CV LAB;  Service: Cardiovascular;  Laterality: N/A;  . NM MYOCAR PERF WALL MOTION  02/21/2010   normal  . RADIOACTIVE SEED IMPLANT  2010   prostate cancer  . THROMBECTOMY W/ EMBOLECTOMY Left 02/26/2018   Procedure: THROMBECTOMY ARTERIOVENOUS GRAFT;  Surgeon: Angelia Mould, MD;  Location: [place];  Service: Vascular;  Laterality: Left;  . ULTRASOUND GUIDANCE FOR VASCULAR ACCESS  02/20/2018   Procedure: Ultrasound Guidance For Vascular Access;  Surgeon: Jettie Booze, MD;  Location: [place] CV LAB;  Service: Cardiovascular;;  . UPPER EXTREMITY VENOGRAPHY N/A 07/08/2018   Procedure: UPPER EXTREMITY VENOGRAPHY;  Surgeon: Waynetta Sandy, MD;  Location: [place] CV LAB;  Service: Cardiovascular;  Laterality: N/A;  Bilateral  . US ECHOCARDIOGRAPHY  02/06/2012   mild LVH,LA mod. dilated,mild-mod. 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 AI.   Family History  Family History  Problem Relation Age of Onset  . Hypertension Mother   . Thyroid disease Mother   . Cholelithiasis Daughter   . Cholelithiasis Son   . Hypertension Maternal Grandmother   . Diabetes Maternal Grandmother   . Heart attack Neg Hx   . Hyperlipidemia Neg Hx    Social History  reports that  he quit smoking about 14 years ago. His smoking use included cigarettes. He has a 30.00 pack-year smoking history. He has never used smokeless tobacco. He reports current alcohol use. He reports that he does not use drugs. Allergies  Allergies  Allergen Reactions  . Cozaar [Losartan Potassium] Other (See Comments)    Causes constipation    Home medications Prior to Admission medications   Medication Sig Start Date End Date Taking? Authorizing Provider  acetaminophen (TYLENOL) 500 MG tablet Take 1,000 mg by mouth every 6 (six) hours as needed for mild pain.   Yes [provider]  allopurinol (ZYLOPRIM) 100 MG tablet TAKE 1 TABLET EVERY DAY Patient taking differently: Take 100 mg by  mouth daily.  12/23/18  Yes Croitoru, Mihai, MD  amiodarone (PACERONE) 200 MG tablet TAKE 1 TABLET(200 MG) BY MOUTH DAILY Patient taking differently: Take 200 mg by mouth daily.  10/29/18  Yes Croitoru, Mihai, MD  AURYXIA 1 GM 210 MG(Fe) tablet Take 420 mg by mouth 3 (three) times daily with meals.  08/05/18  Yes [provider]  bictegravir-emtricitabine-tenofovir AF ([name]) 50-200-25 MG TABS tablet Take 1 tablet by mouth daily. 01/01/19  Yes Tommy Medal, Lavell Islam, MD  brimonidine (ALPHAGAN) 0.15 % ophthalmic solution Place 1 drop into the left eye 2 (two) times daily. 11/15/17  Yes [provider]  dorzolamide-timolol (COSOPT) 22.3-6.8 MG/ML ophthalmic solution Place 1 drop into the left eye 2 (two) times daily. 12/17/17  Yes [provider]  latanoprost (XALATAN) 0.005 % ophthalmic solution Place 1 drop into the left eye at bedtime.    Yes [provider]  loratadine (CLARITIN) 10 MG tablet Take 1 tablet (10 mg total) by mouth daily. 02/21/19  Yes [name], NP  midodrine (PROAMATINE) 10 MG tablet Take 1 tablet (10 mg total) by mouth as directed. Twice a day on the day of HD Tuesday, Thursday, Saturday. 10/23/18  Yes Lavina Hamman, MD  multivitamin (RENA-VIT)  TABS tablet Take 1 tablet by mouth daily.   Yes [provider]  Vitamin D, Ergocalciferol, (DRISDOL) 50000 UNITS CAPS capsule Take 50,000 Units by mouth every Friday.  04/10/15  Yes [provider]  warfarin (COUMADIN) 5 MG tablet Take 2 tablets by mouth daily or as directed by coumadin clinic Patient taking differently: Take 10 mg by mouth daily.  01/30/19  Yes Croitoru, Mihai, MD   Liver Function Tests Recent Labs  Lab 03/04/19 0514  AST 30  ALT 28  ALKPHOS 116  BILITOT 0.8  PROT 8.3*  ALBUMIN 4.2   No results for input(s): LIPASE, AMYLASE in the last 168 hours. CBC Recent Labs  Lab 03/04/19 0514 03/04/19 0841  WBC 12.9*  --   NEUTROABS 7.1  --   HGB 13.8 13.9  HCT 44.8 41.0  MCV 113.1*  --   PLT 294  --    Basic Metabolic Panel Recent Labs  Lab 03/04/19 0514 03/04/19 0841  NA 140 138  K 4.0 4.3  CL 96*  --   CO2 19*  --   GLUCOSE 236*  --   BUN 72*  --   CREATININE 11.44*  --   CALCIUM 9.4  --    Iron/TIBC/Ferritin/ %Sat    Component Value Date/Time   IRON 27 (L) 02/22/2018 0752   TIBC 218 (L) 02/22/2018 0752   FERRITIN 910 (H) 02/22/2018 0752   IRONPCTSAT 12 (L) 02/22/2018 0752   IRONPCTSAT 32 03/06/2013 1138    Vitals:   03/04/19 0630 03/04/19 0700 03/04/19 0715 03/04/19 0826  BP: 104/66 (!) 89/68 97/68 126/77  Pulse:    88  Resp: 18 18 18 18   Temp:    97.9 F (36.6 C)  TempSrc:    Axillary  SpO2:      Weight:    83.5 kg  Height:    6\' 2"  (1.88 m)   Exam Gen on vent, alert , respnoding, calm No rash, cyanosis or gangrene Sclera anicteric, throat w ETT  No jvd or bruits, flat neck veins Chest diffuse rales bilat bases, no wheezing RRR no MRG Abd firm, mod distended, +bs, nontender GU normal male MS no joint effusions or deformity Ext trace bilat LE edema, no wounds  or ulcers  Neuro is alert, Ox 3 , nf    Home meds:  - allopurinol 100/ auryxia tid ac  - warfarin as directed qd/ amiodarone 200 qd  - midodrine 10mg  pre  HD TTS  - bictegravir-emtricitabine-tenofovir 1 qd    [facility]  4h  2/2 bath  84kg   400/800  P2  RUA AVF  Hep 5800 - mircera 75 ug last on 5/27 - calictriol 0.5 tiw      Assessment: 1. Acute resp failure / SOB - suspect acute pulm edema, not sig vol overloaded on exam and is not over dry wt but may be losing body wt and need edw lowering. Plan HD this am , hold/ limit any BP lowering meds during HD this am including sedation, give midodrine pre HD and IV alb prn to keep BP's up and get as much vol off as possible.   2. Hypotension - was HTN'sive in ED until intubated w/ various sedating meds given. Give midodrine pre HD as OP.  3. NICM EF 25% 4. HIV - on ART 5. Anemia - recent esa on 3/26, Hb good =12    Plan: 1. As above.       [facility] 03/04/2019, 9:33 AM

## 2019-03-04 NOTE — Progress Notes (Signed)
  Echocardiogram 2D Echocardiogram has been performed.  Lamorris Knoblock L Androw 03/04/2019, 10:42 AM

## 2019-03-04 NOTE — Code Documentation (Signed)
Attempted to place pt on bipap, pt vomited a large amount, pt to be intubated

## 2019-03-04 NOTE — Progress Notes (Addendum)
Pt. Is stable on current vent settings. ABG reviewed. Orders written , will re-check ABG in 2 hours.   Called renal for consult as patient needs HD for Pulmonary edema/ Cardiomegaly  per CXR. Spoke with Dr. Jonnie Finner who will consult for HD today  Concern for aspiration while on BiPap 3/31 Currently afebrile, WBC 12.9 Trend fever curve and WBC, consider Unasyn if indicated   Orders for Am Labs/ imaging placed  Magdalen Spatz, AGACNP-BC [place] Pager # [phone] If no answer call [phone] 03/04/2019 9:18 AM

## 2019-03-04 NOTE — Progress Notes (Signed)
Initial Nutrition Assessment  DOCUMENTATION CODES:   Not applicable  INTERVENTION:   If pt unable to extubate in 24-48 hrs:  -Vital AF 1.2  @ 60 ml/hr via OGT (1440 ml) -30 ml Prostat BID -Add B-complex with Vit C  Provides: 1928 kcals, 138  grams protein, 1168 ml free water. Meets 104% of kcal needs and 100% of protein needs.   NUTRITION DIAGNOSIS:   Increased nutrient needs related to chronic illness(ESRD on HD) as evidenced by estimated needs.  GOAL:   Patient will meet greater than or equal to 90% of their needs  MONITOR:   Diet advancement, Weight trends, Labs, Vent status, TF tolerance, Skin, I & O's  REASON FOR ASSESSMENT:   Ventilator    ASSESSMENT:   Patient with PMH significant for HIV, ESRD on HD, CHF, cardiac arrest (02/2108), GERD, s/p cardiac cath., HLD, HTN, and nonischemic cardiomyopathy. Presents this admission with acute exacerbation of CHF.    3/31- did not tolerate BiPAP- intubated   RD working remotely.  CCM request TF be held until after HD (plan for this am) as they suspect pt's status will improve. RD to leave recommendations.   Unable to obtain nutrition history or weight history. Chart records indicate pt's weight has fluctuated from 80.7 kg to 89 kg over the last year. Per nephrology, EDW is 84 kg and pt does not look to be fluid overloaded. Suspect some dry weight loss and EDW likely needs to be decreased. Will try to obtain more intake history is possible.   Unable to complete Nutrition-Focused physical exam at this time.   Patient is currently intubated on ventilator support MV: 11.4 L/min Temp (24hrs), Avg:98.1 F (36.7 C), Min:97.9 F (36.6 C), Max:98.2 F (36.8 C)  I/O: none recorded   Medications reviewed and include: SS novolog, fentanyl Labs reviewed: calcium ionized 1.13 (L) CBG 236    Diet Order:   Diet Order    None      EDUCATION NEEDS:   Not appropriate for education at this time  Skin:  Skin Assessment:  Reviewed RN Assessment  Last BM:  PTA  Height:   Ht Readings from Last 1 Encounters:  03/04/19 6\' 2"  (1.88 m)    Weight:   Wt Readings from Last 1 Encounters:  03/04/19 83.5 kg    Ideal Body Weight:  86.4 kg  BMI:  Body mass index is 23.64 kg/m.  Estimated Nutritional Needs:   Kcal:  1845 kcal  Protein:  125-145 grams  Fluid:  1000 ml +UOP   Mariana Single RD, LDN Clinical Nutrition Pager # [phone]

## 2019-03-04 NOTE — H&P (Signed)
Name: David [name] MRN: [id number] DOB: [birth year] CC: sob   LOS: 0  [facility] / Critical Care Note   History of Present Illness: 78 y.o. 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 He arrived in resp distress, placed on BIPAP but vomited and had to be intubated. He was here 2 weeks ago with similar presentation. It appears to have been an acute episode of SOB , does not seem he was feeling sick in the past couple of days but hx is limited as during my evaluation the patient is already intubated, the hx is gathered by the ED pre-intubation. No family available.   Lines / Drains: Peripherals   Cultures: pending Viral panel pending   Antibiotics: NONE   Tests / Events:    Past Medical History:  Diagnosis Date  . Acute on chronic systolic and diastolic heart failure, NYHA class 4 ([place])   . Anemia, iron deficiency 11/15/2011  . Arthritis    "hands, right knee, feet" (02/21/2018)  . Atrial fibrillation ([place])   . Cancer [name])    hx of prostate; s/p radioactive seed implant 10/2009 Dr Janice Norrie  . Cardiac arrest ([place]) 02/17/2018  . CHF (congestive heart failure) ([place])   . Chronic lower back pain   . CKD (chronic kidney disease) stage V requiring chronic dialysis ([place])   . ESRD on dialysis ([place])    started 02/2018  . GERD (gastroesophageal reflux disease)   . Glaucoma   . Gout    daily RX (02/21/2018)  . Heart murmur    "mild" per pt  . Hepatitis    years ago  . History of cardiac cath 2004   negative for CAD  . History of myocardial perfusion scan 02/2010   negative for coronary insufficiency (LVEF 27%)  . HIV (human immunodeficiency virus infection) ([place])   . HIV  infection ([place])   . Hyperlipidemia   . Hypertension    followed by [facility] (Dr Dani Gobble Croitoru)  . Hypertension   . Nonischemic cardiomyopathy ([place])   . Pneumonia 11/2017  . Prostate cancer ([place])   . Sinus bradycardia   . Sleep apnea    does not use a cpap  . Stroke [facility])    "mini stroke" years ago  . Systolic and diastolic CHF, acute ([place]) 06/2010   felt to be secondary to hypertensive cardiomyopathy    Past Surgical History:  Procedure Laterality Date  . AV FISTULA PLACEMENT Right 10/13/2016   Procedure: ARTERIOVENOUS (AV) FISTULA CREATION;  Surgeon: [name], MD;  Location: [place];  Service: Vascular;  Laterality: Right;  . AV FISTULA PLACEMENT Right 10/13/2016   [name] [phone]  . AV FISTULA PLACEMENT Left 02/25/2018   Procedure: INSERTION OF ARTERIOVENOUS (AV) GORE-TEX GRAFT LEFT UPPER ARM;  Surgeon: Angelia Mould, MD;  Location: [place];  Service: Vascular;  Laterality: Left;  . AV FISTULA PLACEMENT Right 08/07/2018   Procedure: Creation of right arm brachiocephalic Fistula;  Surgeon: Waynetta Sandy, MD;  Location: [place];  Service: Vascular;  Laterality: Right;  . BASCILIC VEIN TRANSPOSITION Left 06/24/2015   Procedure: BASILIC VEIN TRANSPOSITION;  Surgeon: Mal Misty, MD;  Location: [place];  Service: Vascular;  Laterality: Left;  .  BASCILIC VEIN TRANSPOSITION Left 9/93/7169 Bascilic vein transposition (Left)   [name] [id number]  . CARDIAC CATHETERIZATION  02/04/2003   mildly depressed LV systolic fx EF 75%,ZWCHEN coronaries/abdominal aorta/renal arteries.  Marland Kitchen CARDIAC CATHETERIZATION  2004  . CATARACT EXTRACTION, BILATERAL Bilateral   . COLONOSCOPY    . ESOPHAGOGASTRODUODENOSCOPY (EGD) WITH PROPOFOL N/A 05/05/2014   Procedure: ESOPHAGOGASTRODUODENOSCOPY (EGD) WITH PROPOFOL;  Surgeon: Missy Sabins, MD;  Location[facility];  Service: Endoscopy;  Laterality: N/A;  . EYE SURGERY Bilateral    cataract surgery   . EYE SURGERY Bilateral     glaucoma surgery  . EYE SURGERY    . GLAUCOMA SURGERY Bilateral   . INSERTION OF ARTERIOVENOUS (AV) ARTEGRAFT ARM  10/09/2018   Procedure: INSERTION OF ARTERIOVENOUS (AV) 79mm x 41cm ARTEGRAFT LEFT UPPER ARM;  Surgeon: Waynetta Sandy, MD;  Location: [place];  Service: Vascular;;  . INSERTION PROSTATE RADIATION SEED    . IR FLUORO GUIDE CV LINE RIGHT  02/18/2018  . IR REMOVAL TUN CV CATH W/O FL  12/06/2018  . IR US GUIDE VASC ACCESS RIGHT  02/18/2018  . LEFT HEART CATH AND CORONARY ANGIOGRAPHY N/A 02/20/2018   Procedure: LEFT HEART CATH AND CORONARY ANGIOGRAPHY;  Surgeon: Jettie Booze, MD;  Location: [place] [facility];  Service: Cardiovascular;  Laterality: N/A;  . NM MYOCAR PERF WALL MOTION  02/21/2010   normal  . RADIOACTIVE SEED IMPLANT  2010   prostate cancer  . THROMBECTOMY W/ EMBOLECTOMY Left 02/26/2018   Procedure: THROMBECTOMY ARTERIOVENOUS GRAFT;  Surgeon: Angelia Mould, MD;  Location: [place];  Service: Vascular;  Laterality: Left;  . ULTRASOUND GUIDANCE FOR VASCULAR ACCESS  02/20/2018   Procedure: Ultrasound Guidance For Vascular Access;  Surgeon: Jettie Booze, MD;  Location: [place] CV LAB;  Service: Cardiovascular;;  . UPPER EXTREMITY VENOGRAPHY N/A 07/08/2018   Procedure: UPPER EXTREMITY VENOGRAPHY;  Surgeon: Waynetta Sandy, MD;  Location: [place] [facility];  Service: Cardiovascular;  Laterality: N/A;  Bilateral  . US ECHOCARDIOGRAPHY  02/06/2012   mild LVH,LA mod. dilated,mild-mod. 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 AI.    Prior to Admission medications   Medication Sig Start Date End Date Taking? Authorizing Provider  acetaminophen (TYLENOL) 500 MG tablet Take 1,000 mg by mouth every 6 (six) hours as needed for mild pain.    [provider]  allopurinol (ZYLOPRIM) 100 MG tablet TAKE 1 TABLET EVERY DAY Patient taking differently: Take 100 mg by mouth daily.  12/23/18   Croitoru, Mihai, MD  amiodarone  (PACERONE) 200 MG tablet TAKE 1 TABLET(200 MG) BY MOUTH DAILY Patient taking differently: Take 200 mg by mouth daily.  10/29/18   Croitoru, Mihai, MD  AURYXIA 1 GM 210 MG(Fe) tablet Take 420 mg by mouth 3 (three) times daily with meals.  08/05/18   [provider]  bictegravir-emtricitabine-tenofovir AF ([name]) 50-200-25 MG TABS tablet Take 1 tablet by mouth daily. 01/01/19   Truman Hayward, MD  brimonidine (ALPHAGAN) 0.15 % ophthalmic solution Place 1 drop into the left eye 2 (two) times daily. 11/15/17   [provider]  dorzolamide-timolol (COSOPT) 22.3-6.8 MG/ML ophthalmic solution Place 1 drop into the left eye 2 (two) times daily. 12/17/17   [provider]  latanoprost (XALATAN) 0.005 % ophthalmic solution Place 1 drop into the left eye at bedtime.     [provider]  loratadine (CLARITIN) 10 MG tablet Take 1 tablet (10 mg total) by mouth  daily. 02/21/19   [name], NP  midodrine (PROAMATINE) 10 MG tablet Take 1 tablet (10 mg total) by mouth as directed. Twice a day on the day of HD Tuesday, Thursday, Saturday. 10/23/18   Lavina Hamman, MD  multivitamin (RENA-VIT) TABS tablet Take 1 tablet by mouth daily.    [provider]  Vitamin D, Ergocalciferol, (DRISDOL) 50000 UNITS CAPS capsule Take 50,000 Units by mouth every Friday.  04/10/15   [provider]  warfarin (COUMADIN) 5 MG tablet Take 2 tablets by mouth daily or as directed by coumadin clinic Patient taking differently: Take 10 mg by mouth daily.  01/30/19   Croitoru, Dani Gobble, MD    Allergies Allergies  Allergen Reactions  . Cozaar [Losartan Potassium] Other (See Comments)    Causes constipation     Family History Family History  Problem Relation Age of Onset  . Hypertension Mother   . Thyroid disease Mother   . Cholelithiasis Daughter   . Cholelithiasis Son   . Hypertension Maternal Grandmother   . Diabetes Maternal Grandmother   . Heart attack Neg Hx    . Hyperlipidemia Neg Hx     Social History  reports that he quit smoking about 14 years ago. His smoking use included cigarettes. He has a 30.00 pack-year smoking history. He has never used smokeless tobacco. He reports current alcohol use. He reports that he does not use drugs.  Review Of Systems:   Vital Signs: Temp:  [98.2 F (36.8 C)] 98.2 F (36.8 C) (03/31 0511) Pulse Rate:  [104-147] 104 (03/31 0619) Resp:  [18-40] 18 (03/31 0630) BP: (104-187)/(66-101) 104/66 (03/31 0630) SpO2:  [88 %-100 %] 100 % (03/31 0619) FiO2 (%):  [100 %] 100 % (03/31 0619) No intake/output data recorded.  Physical Examination: General: sedated on the vent , moves all ext and follows commands Neuro: moves all ext and follwos commands , PERRLA , EOMI CV: IRR, tachycardic with systolic murmur PULM: rales diffusely bilaterally , no wheezing or ronchi GI: BS + , s, nt , nd , no HSM Extremities: +1 PITTING edema in the LE b/l , dry skin     Ventilator settings: Vent Mode: PRVC FiO2 (%):  [100 %] 100 % Set Rate:  [18 bmp] 18 bmp Vt Set:  [600 mL] 600 mL PEEP:  [10 cmH20] 10 cmH20 Plateau Pressure:  [24 cmH20] 24 cmH20  Labs    CBC Recent Labs  Lab 03/04/19 0514  HGB 13.8  HCT 44.8  WBC 12.9*  PLT 294     BMET Recent Labs  Lab 03/04/19 0514  NA 140  K 4.0  CL 96*  CO2 19*  GLUCOSE 236*  BUN 72*  CREATININE 11.44*  CALCIUM 9.4    No results for input(s): INR in the last 168 hours.  No results for input(s): PHART, PCO2ART, PO2ART, HCO3, TCO2, O2SAT in the last 168 hours.  Invalid input(s): PCO2, PO2   Radiology:  CXR - diffuse pulmonary edema    Assessment and Plan: Principal Problem:   Acute respiratory failure with hypoxia and hypercapnia (HCC) Active Problems:   HIV (human immunodeficiency virus infection) ([place])   Pulmonary edema  Acute on chronic systolic heart failure - hx of non isquemic CMP , cath last year EF 25% with non obs CAD If hx of compliance  with HD is true then I suspect he might have been going in and out of AFIB WITH RVR causing pulm edema, had similar problem 2 weeks ago and  resolved with HD. Unfortunately he did not tolerate BIPAP  Acute resp failure with hypoxemia and hypercapnea secondary to above nephro consult for HD CXR -pulm edema Will hold off abx Viral panel ordered Hold TF till after HD , I expect him to improve Full vent support , Prop/fentanyl , nebs prn , CPAP trial after HD  ESRD on HD - Nephro consult for HD , needs fluid removal , unclear if he is compliant with HD but says so.   Hx of Afib - cont amiodarone , currently rate controlled On coumadin , INR therapeutic , will hold coumadin today , restart pending clinical course   HIV - cont HAART   Best practices / Disposition: -->Code Status: FULL -->DVT Px: on coumadin -->GI Px: PPI -->Diet:NPO , nutrition c/s    Gabbi Whetstone R. Claudie Leach , MD Critical Care Medicine  Critical care time spent : 40 minutes  03/04/2019, 6:46 AM

## 2019-03-04 NOTE — ED Notes (Signed)
Paged Critical Care

## 2019-03-04 NOTE — ED Triage Notes (Signed)
Pt comes via [facility] for SOB that started at about 230, dialysis pt, has not missed any treatments. Rales in all lobes.

## 2019-03-04 NOTE — ED Provider Notes (Signed)
CHIEF COMPLAINT: Respiratory distress  HPI: Patient is a 78 year old male with history of atrial fibrillation, CHF, end-stage renal disease on hemodialysis, hypertension who presents to the emergency department with acute onset shortness of breath that started tonight.  No chest pain or just discomfort.  No fever.  Has had a cough with pink frothy sputum.  Sats were in the 70s on room air per EMS.  Was placed on nonrebreather which brought him to the low 80s.  Placed on CPAP prior to arrival.  Patient has had no sick contacts.  States this feels like his previous episodes of acute pulmonary edema and feels like BiPAP would be helpful as well as a dialysis session.  ROS: Level 5 caveat for respiratory distress  PAST MEDICAL HISTORY/PAST SURGICAL HISTORY:  Past Medical History:  Diagnosis Date  . Acute on chronic systolic and diastolic heart failure, NYHA class 4 ([place])   . Anemia, iron deficiency 11/15/2011  . Arthritis    "hands, right knee, feet" (02/21/2018)  . Atrial fibrillation ([place])   . Cancer [facility])    hx of prostate; s/p radioactive seed implant 10/2009 Dr Janice Norrie  . Cardiac arrest ([place]) 02/17/2018  . CHF (congestive heart failure) ([place])   . Chronic lower back pain   . CKD (chronic kidney disease) stage V requiring chronic dialysis ([place])   . ESRD on dialysis ([place])    started 02/2018  . GERD (gastroesophageal reflux disease)   . Glaucoma   . Gout    daily RX (02/21/2018)  . Heart murmur    "mild" per pt  . Hepatitis    years ago  . History of cardiac cath 2004   negative for CAD  . History of myocardial perfusion scan 02/2010   negative for coronary insufficiency (LVEF 27%)  . HIV (human immunodeficiency virus infection) ([place])   . HIV infection ([place])   . Hyperlipidemia   . Hypertension    followed by [facility] V[facility] (Dr Dani Gobble Croitoru)  . Hypertension   . Nonischemic cardiomyopathy ([place])   . Pneumonia 11/2017  . Prostate cancer ([place])   . Sinus bradycardia    . Sleep apnea    does not use a cpap  . Stroke [facility])    "mini stroke" years ago  . Systolic and diastolic CHF, acute ([place]) 06/2010   felt to be secondary to hypertensive cardiomyopathy    MEDICATIONS:  Prior to Admission medications   Medication Sig Start Date End Date Taking? Authorizing Provider  acetaminophen (TYLENOL) 500 MG tablet Take 1,000 mg by mouth every 6 (six) hours as needed for mild pain.    [provider]  allopurinol (ZYLOPRIM) 100 MG tablet TAKE 1 TABLET EVERY DAY Patient taking differently: Take 100 mg by mouth daily.  12/23/18   Croitoru, Mihai, MD  amiodarone (PACERONE) 200 MG tablet TAKE 1 TABLET(200 MG) BY MOUTH DAILY Patient taking differently: Take 200 mg by mouth daily.  10/29/18   Croitoru, Mihai, MD  AURYXIA 1 GM 210 MG(Fe) tablet Take 420 mg by mouth 3 (three) times daily with meals.  08/05/18   [provider]  bictegravir-emtricitabine-tenofovir AF ([name]) 50-200-25 MG TABS tablet Take 1 tablet by mouth daily. 01/01/19   Truman Hayward, MD  brimonidine (ALPHAGAN) 0.15 % ophthalmic solution Place 1 drop into the left eye 2 (two) times daily. 11/15/17   [provider]  dorzolamide-timolol (COSOPT) 22.3-6.8 MG/ML ophthalmic solution Place 1 drop into the left eye 2 (two) times daily. 12/17/17  [provider]  latanoprost (XALATAN) 0.005 % ophthalmic solution Place 1 drop into the left eye at bedtime.     [provider]  loratadine (CLARITIN) 10 MG tablet Take 1 tablet (10 mg total) by mouth daily. 02/21/19   [name], NP  midodrine (PROAMATINE) 10 MG tablet Take 1 tablet (10 mg total) by mouth as directed. Twice a day on the day of HD Tuesday, Thursday, Saturday. 10/23/18   Lavina Hamman, MD  multivitamin (RENA-VIT) TABS tablet Take 1 tablet by mouth daily.    [provider]  Vitamin D, Ergocalciferol, (DRISDOL) 50000 UNITS CAPS capsule Take 50,000 Units by mouth every Friday.  04/10/15    [provider]  warfarin (COUMADIN) 5 MG tablet Take 2 tablets by mouth daily or as directed by coumadin clinic Patient taking differently: Take 10 mg by mouth daily.  01/30/19   Croitoru, Dani Gobble, MD    ALLERGIES:  Allergies  Allergen Reactions  . Cozaar [Losartan Potassium] Other (See Comments)    Causes constipation     SOCIAL HISTORY:  Social History   Tobacco Use  . Smoking status: Former Smoker    Packs/day: 1.00    Years: 30.00    Pack years: 30.00    Types: Cigarettes    Last attempt to quit: 2006    Years since quitting: 14.2  . Smokeless tobacco: Never Used  Substance Use Topics  . Alcohol use: Yes    Comment: once a week-wine    FAMILY HISTORY: Family History  Problem Relation Age of Onset  . Hypertension Mother   . Thyroid disease Mother   . Cholelithiasis Daughter   . Cholelithiasis Son   . Hypertension Maternal Grandmother   . Diabetes Maternal Grandmother   . Heart attack Neg Hx   . Hyperlipidemia Neg Hx     EXAM: BP (!) 168/95   Pulse (!) 147   Temp 98.2 F (36.8 C) (Axillary)   Resp (!) 37   Ht 6\' 2"  (1.88 m)   SpO2 (!) 88%   BMI 24.14 kg/m  CONSTITUTIONAL: Alert and oriented and responds appropriately to questions.  Chronically ill-appearing, afebrile HEAD: Normocephalic EYES: Conjunctivae clear, pupils appear equal, EOMI ENT: normal nose; moist mucous membranes NECK: Supple, no meningismus, no nuchal rigidity, no LAD  CARD: Regular and tachycardic; S1 and S2 appreciated; no murmurs, no clicks, no rubs, no gallops RESP: Patient is tachypneic, hypoxic on nonrebreather, increased work of breathing, speaking 1-2 words at a time, rhonchorous breath sounds diffusely ABD/GI: Normal bowel sounds; non-distended; soft, non-tender, no rebound, no guarding, no peritoneal signs, no hepatosplenomegaly BACK:  The back appears normal and is non-tender to palpation, there is no CVA tenderness EXT: Normal ROM in all joints; non-tender to  palpation; no edema; normal capillary refill; no cyanosis, no calf tenderness or swelling    SKIN: Normal color for age and race; warm; no rash NEURO: Moves all extremities equally PSYCH: The patient's mood and manner are appropriate. Grooming and personal hygiene are appropriate.  MEDICAL DECISION MAKING: Patient here with likely acute flash pulmonary edema.  Initially very hypertensive with EMS.  I have low suspicion that this is normal coronavirus causing his symptoms.  He has had a cough with pink frothy sputum but again suspect this is from edema.  He states this started suddenly.  Given staff concerns for coronavirus, we have tried to keep him on a nonrebreather prior to placing him on BiPAP.  We have given him IV Lasix  and Nitropaste which have improved his blood pressures but he still has increased work of breathing.  I went to place the patient on BiPAP and he began having large volume nonbloody, nonbilious emesis.  Decision was made at that point to intubate the patient given it would not be safe given his vomiting.  Patient initially reluctant to be intubated but now agrees.  Chest x-ray after intubation concerning for diffuse pulmonary edema.  Discussed with Dr. Emmit Alexanders with critical care for admission.  ED PROGRESS: Patient's blood pressure has dropped with propofol and positive pressure ventilation.  I do not suspect that this is sepsis.  He appears volume overloaded.  I do not think he needs IV fluids.  We will stop his propofol and start a fentanyl drip for sedation.  He is being admitted to the ICU.   I reviewed all nursing notes, vitals, pertinent previous records, EKGs, lab and urine results, imaging (as available).      EKG Interpretation  Date/Time:  Tuesday March 04 2019 04:59:59 EDT Ventricular Rate:  146 PR Interval:    QRS Duration: 147 QT Interval:  332 QTC Calculation: 518 R Axis:   -64 Text Interpretation:  Sinus tachycardia Left bundle branch block No significant  change since last tracing Reconfirmed by Ward, Cyril Mourning [phone]) on 03/04/2019 6:18:04 AM         CRITICAL CARE Performed by: Cyril Mourning Ward   Total critical care time: 75 minutes  Critical care time was exclusive of separately billable procedures and treating other patients.  Critical care was necessary to treat or prevent imminent or life-threatening deterioration.  Critical care was time spent personally by me on the following activities: development of treatment plan with patient and/or surrogate as well as nursing, discussions with consultants, evaluation of patient's response to treatment, examination of patient, obtaining history from patient or surrogate, ordering and performing treatments and interventions, ordering and review of laboratory studies, ordering and review of radiographic studies, pulse oximetry and re-evaluation of patient's condition.    Procedure Name: Intubation Date/Time: 03/04/2019 6:28 AM Performed by: Ward, Delice Bison, DO Pre-anesthesia Checklist: Patient identified, Patient being monitored, Emergency Drugs available, Timeout performed and Suction available Oxygen Delivery Method: Non-rebreather mask Preoxygenation: Pre-oxygenation with 100% oxygen Induction Type: Rapid sequence Ventilation: Mask ventilation without difficulty Placement Confirmation: ETT inserted through vocal cords under direct vision,  CO2 detector and Breath sounds checked- equal and bilateral         Ward, Delice Bison, DO 03/04/19 1856

## 2019-03-04 NOTE — Procedures (Signed)
Intubation Procedure Note David [name] [id number] 13-Nov-1941  Procedure: Intubation Indications: Respiratory insufficiency  Procedure Details Consent: Risks of procedure as well as the alternatives and risks of each were explained to the (patient/caregiver).  Consent for procedure obtained. Time Out: Verified patient identification, verified procedure, site/side was marked, verified correct patient position, special equipment/implants available, medications/allergies/relevent history reviewed, required imaging and test results available.  Performed  Maximum sterile technique was used including antiseptics, cap, gloves, gown, hand hygiene, mask and sheet.  MAC and 3    Evaluation Hemodynamic Status: BP stable throughout; O2 sats: stable throughout Patient's Current Condition: stable Complications: No apparent complications Patient did tolerate procedure well. Chest X-ray ordered to verify placement.  CXR: tube position acceptable   Patient intubated by MD Cyril Mourning Ward without complications.   David [name] 03/04/2019

## 2019-03-05 ENCOUNTER — Inpatient Hospital Stay (HOSPITAL_COMMUNITY): Payer: Medicare HMO

## 2019-03-05 DIAGNOSIS — R7989 Other specified abnormal findings of blood chemistry: Secondary | ICD-10-CM

## 2019-03-05 DIAGNOSIS — Z21 Asymptomatic human immunodeficiency virus [HIV] infection status: Secondary | ICD-10-CM

## 2019-03-05 DIAGNOSIS — I5043 Acute on chronic combined systolic (congestive) and diastolic (congestive) heart failure: Secondary | ICD-10-CM

## 2019-03-05 LAB — GLUCOSE, CAPILLARY
Glucose-Capillary: 120 mg/dL — ABNORMAL HIGH (ref 70–99)
Glucose-Capillary: 127 mg/dL — ABNORMAL HIGH (ref 70–99)
Glucose-Capillary: 169 mg/dL — ABNORMAL HIGH (ref 70–99)
Glucose-Capillary: 48 mg/dL — ABNORMAL LOW (ref 70–99)
Glucose-Capillary: 87 mg/dL (ref 70–99)
Glucose-Capillary: 96 mg/dL (ref 70–99)
Glucose-Capillary: 97 mg/dL (ref 70–99)
Glucose-Capillary: 99 mg/dL (ref 70–99)

## 2019-03-05 LAB — CBC
HCT: 36.6 % — ABNORMAL LOW (ref 39.0–52.0)
Hemoglobin: 12 g/dL — ABNORMAL LOW (ref 13.0–17.0)
MCH: 35.2 pg — ABNORMAL HIGH (ref 26.0–34.0)
MCHC: 32.8 g/dL (ref 30.0–36.0)
MCV: 107.3 fL — ABNORMAL HIGH (ref 80.0–100.0)
NRBC: 0 % (ref 0.0–0.2)
Platelets: 217 10*3/uL (ref 150–400)
RBC: 3.41 MIL/uL — ABNORMAL LOW (ref 4.22–5.81)
RDW: 15.8 % — AB (ref 11.5–15.5)
WBC: 8.6 10*3/uL (ref 4.0–10.5)

## 2019-03-05 LAB — BASIC METABOLIC PANEL
Anion gap: 16 — ABNORMAL HIGH (ref 5–15)
BUN: 41 mg/dL — ABNORMAL HIGH (ref 8–23)
CO2: 27 mmol/L (ref 22–32)
Calcium: 9.3 mg/dL (ref 8.9–10.3)
Chloride: 93 mmol/L — ABNORMAL LOW (ref 98–111)
Creatinine, Ser: 8.18 mg/dL — ABNORMAL HIGH (ref 0.61–1.24)
GFR calc Af Amer: 7 mL/min — ABNORMAL LOW (ref >60)
GFR calc non Af Amer: 6 mL/min — ABNORMAL LOW (ref >60)
Glucose, Bld: 153 mg/dL — ABNORMAL HIGH (ref 70–99)
POTASSIUM: 4.4 mmol/L (ref 3.5–5.1)
Sodium: 136 mmol/L (ref 135–145)

## 2019-03-05 LAB — PROTIME-INR
INR: 2 — ABNORMAL HIGH (ref 0.8–1.2)
Prothrombin Time: 22.4 seconds — ABNORMAL HIGH (ref 11.4–15.2)

## 2019-03-05 LAB — PHOSPHORUS: Phosphorus: 4.4 mg/dL (ref 2.5–4.6)

## 2019-03-05 LAB — MAGNESIUM: Magnesium: 2.3 mg/dL (ref 1.7–2.4)

## 2019-03-05 MED ORDER — RENA-VITE PO TABS
1.0000 | ORAL_TABLET | Freq: Every day | ORAL | Status: DC
  Administered 2019-03-05: 1 via ORAL
  Filled 2019-03-05: qty 1

## 2019-03-05 MED ORDER — DEXTROSE 50 % IV SOLN
INTRAVENOUS | Status: AC
  Administered 2019-03-05: 50 mL via INTRAVENOUS
  Filled 2019-03-05: qty 50

## 2019-03-05 MED ORDER — PRO-STAT SUGAR FREE PO LIQD
30.0000 mL | Freq: Two times a day (BID) | ORAL | Status: DC
  Administered 2019-03-05 – 2019-03-06 (×2): 30 mL via ORAL
  Filled 2019-03-05 (×2): qty 30

## 2019-03-05 MED ORDER — DEXTROSE 50 % IV SOLN
1.0000 | Freq: Once | INTRAVENOUS | Status: AC
  Administered 2019-03-05: 50 mL via INTRAVENOUS

## 2019-03-05 MED ORDER — CHLORHEXIDINE GLUCONATE CLOTH 2 % EX PADS: 6.0000 | MEDICATED_PAD | Freq: Every day | CUTANEOUS | Status: DC

## 2019-03-05 NOTE — Progress Notes (Signed)
Blood glucose was 48 and per protocol amp of d50 given.  CBG rechecked and is now 169.

## 2019-03-05 NOTE — Progress Notes (Signed)
Nutrition Follow-up  RD working remotely.  DOCUMENTATION CODES:   Not applicable  INTERVENTION:   -Renal MVI daily -30 ml Prostat BID, each supplement provides 100 kcals and 15 grams protein -Continue liberalized diet of 2 gram sodium to promote adequate oral intake  NUTRITION DIAGNOSIS:   Increased nutrient needs related to chronic illness(ESRD on HD) as evidenced by estimated needs.  Ongoing  GOAL:   Patient will meet greater than or equal to 90% of their needs  Progressing  MONITOR:   PO intake, Labs, Weight trends, Skin, I & O's  REASON FOR ASSESSMENT:   Ventilator    ASSESSMENT:   Patient with PMH significant for HIV, ESRD on HD, CHF, cardiac arrest (02/2108), GERD, s/p cardiac cath., HLD, HTN, and nonischemic cardiomyopathy. Presents this admission with acute exacerbation of CHF.   4/1- extubated, transferred from ICU to floor  Reviewed I/O's: -2.1 L x 24 hours  UOP: 0 ml x 24 hours  Per nephrology notes, pt -3 L with HD yesterday. Next HD planned for tomorrow.   Spoke with pt over the phone. He reports he is very hungry and is eating lunch during phone call (spaghetti, meatballs, and green beans). PTA, he had a good appetite and consumes 3 meals per day (Breakfast: cereal (cornflakes; Lunch: eggs, sausage, and grits, Dinner: meat, starch, and vegetable (he really enjoys fish). Pt denies any changes in his eating habits during HD and non-HD days. He denies any trouble swallowing post-extubation.  Reviewed wt hx; noted pt has experienced a 2.9% wt gain over the past 3 months, which is not significant for time frame. Per HD orders, dry wt is 84 kg (pt confirmed that this is his dry weight). He denies any weight loss and reports UBW of around 180#.   Discussed with pt importance of good meal intake to promote healing. Pt with no further questions or concerns.   Labs reviewed: K, Mg, and Phos WDL. CBGS: 87-97.   Diet Order:   Diet Order            Diet 2  gram sodium Room service appropriate? Yes; Fluid consistency: Thin  Diet effective now              EDUCATION NEEDS:   Education needs have been addressed  Skin:  Skin Assessment: Reviewed RN Assessment  Last BM:  Unknown  Height:   Ht Readings from Last 1 Encounters:  03/04/19 6\' 2"  (1.88 m)    Weight:   Wt Readings from Last 1 Encounters:  03/05/19 83 kg    Ideal Body Weight:  86.4 kg  BMI:  Body mass index is 23.49 kg/m.  Estimated Nutritional Needs:   Kcal:  2000-2200  Protein:  105-120 grams  Fluid:  1000 ml + UOP    Diamone Whistler A. Jimmye Norman, RD, LDN, [place] Registered Dietitian II Certified Diabetes Care and Education Specialist Pager: [phone] After hours Pager: [phone]

## 2019-03-05 NOTE — Consult Note (Signed)
Cardiology Consult    Patient ID: Winfield Caba MRN: [id number], DOB/AGE: 05-08-1941   Admit date: 03/04/2019 Date of Consult: 03/05/2019  Primary Physician: [name], NP Primary Cardiologist: [name], MD Requesting Provider: Kara Mead, MD  Patient Profile    [facility]ri is a 78 y.o. 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 Elsworth Soho.  History of Present Illness    Mr. Kau is a 78 year old male with the above history who is followed by [name]. Patient admitted in 02/2018 following PEA arrest. Left heart catheterization at that time showed non-obstructive CAD with 50% stenosis of ostial 2nd marginal to 2nd marginal and 25% stenosis of the mid LAD. Echocardiogram showed moderately dilated LV with EF of 15-20% and diffuse hypokinesis with akinesis at the inferior inferolateral walls. Hospitalization was complicated by development of new onset atrial fibrillation and progression to ESRD on hemodialysis. Volume has been difficult to manage with hemodialysis due to hypotension. Patient now on Midodrine 69m daily on non-dialysis days and 161mtwice daily on dialysis days. Patient has had multiple recurrent hospitalizations over the last several months for acute pulmonary edema with hypoxic respiratory failure requiring BiPAP. Patient recently admitted from 02/10/2019 for one of these episodes. He requirement BiPaP and additional dialysis in the hospital with improvement. Initially, there was some concern for probable pneumonia so he did receive some antibiotics.   [facility] for acute  shortness of breath. Per EMS, initial O2 sats were in the 70s on room air. He was placed on non-rebreather with some improvement into the low 80's. Patient eventually had to be transitioned to CPAP prior to arrival. He was briefly placed on BiPAP in the ED but vomited so he had to be intubated. Patient underwent dialysis session yesterday and 2 L of fluid was removed with improvement. Patient was able to be extubated earlier today. Given recurrent episodes of acute pulmonary edema, Cardiology was consulted.   He has had recurrent episodes of fluid overload. HD is limited by low BP, requiring midodrine (higher dose on HD days -TTS). Current dry weight is 84 kg. Decompensation has happened after the longer weekend HD interval. He usually gains 2.4 kg on the weekend.  Past Medical History   Past Medical History:  Diagnosis Date  . Acute on chronic systolic and diastolic heart failure, NYHA class 4 ([facility]  . Anemia, iron deficiency 11/15/2011  . Arthritis    "hands, right knee, feet" (02/21/2018)  . Atrial fibrillation [facility]  . [facility]   hx of prostate; s/p radioactive seed implant 10/2009 Dr NeJanice Norrie. Cardiac arrest ([facility]/17[place]  . CHF (congestive heart failure) ([facility]  . Chronic lower back pain   . CKD (chronic kidney disease) stage V requiring chronic dialysis ([facility]  . ESRD on dialysis ([facility] [place]   started 02/2018  . GERD (gastroesophageal reflux disease)   . Glaucoma   . Gout    daily RX (02/21/2018)  . Heart murmur    "mild" per pt  . Hepatitis    years ago  . History of cardiac cath 2004   negative for CAD  .  History of myocardial perfusion scan 02/2010   negative for coronary insufficiency (LVEF 27%)  . HIV (human immunodeficiency virus infection) ([place])   . HIV infection ([place])   . Hyperlipidemia   . Hypertension    followed by [facility] and Vascular (Dr Dani Gobble Addelynn Batte)  . Hypertension   . Nonischemic cardiomyopathy ([place])   . Pneumonia 11/2017  . Prostate cancer  ([place])   . Sinus bradycardia   . Sleep apnea    does not use a cpap  . Stroke [facility])    "mini stroke" years ago  . Systolic and diastolic CHF, acute ([place]) 06/2010   felt to be secondary to hypertensive cardiomyopathy    Past Surgical History:  Procedure Laterality Date  . AV FISTULA PLACEMENT Right 10/13/2016   Procedure: ARTERIOVENOUS (AV) FISTULA CREATION;  Surgeon: [name], MD;  Location: [place];  Service: Vascular;  Laterality: Right;  . AV FISTULA PLACEMENT Right 10/13/2016   [name] [phone]  . AV FISTULA PLACEMENT Left 02/25/2018   Procedure: INSERTION OF ARTERIOVENOUS (AV) GORE-TEX GRAFT LEFT UPPER ARM;  Surgeon: Angelia Mould, MD;  Location: [place];  Service: Vascular;  Laterality: Left;  . AV FISTULA PLACEMENT Right 08/07/2018   Procedure: Creation of right arm brachiocephalic Fistula;  Surgeon: Waynetta Sandy, MD;  Location: [place];  Service: Vascular;  Laterality: Right;  . BASCILIC VEIN TRANSPOSITION Left 06/24/2015   Procedure: BASILIC VEIN TRANSPOSITION;  Surgeon: Mal Misty, MD;  Location: [place];  Service: Vascular;  Laterality: Left;  . BASCILIC VEIN TRANSPOSITION Left 07/04/6552 Bascilic vein transposition (Left)   [name] [phone]  . CARDIAC CATHETERIZATION  02/04/2003   mildly depressed LV systolic fx EF 75%,QGBEEF coronaries/abdominal aorta/renal arteries.  Marland Kitchen CARDIAC CATHETERIZATION  2004  . CATARACT EXTRACTION, BILATERAL Bilateral   . COLONOSCOPY    . ESOPHAGOGASTRODUODENOSCOPY (EGD) WITH PROPOFOL N/A 05/05/2014   Procedure: ESOPHAGOGASTRODUODENOSCOPY (EGD) WITH PROPOFOL;  Surgeon: Missy Sabins, MD;  Location: [facility];  Service: Endoscopy;  Laterality: N/A;  . EYE SURGERY Bilateral    cataract surgery   . EYE SURGERY Bilateral    glaucoma surgery  . EYE SURGERY    . GLAUCOMA SURGERY Bilateral   . INSERTION OF ARTERIOVENOUS (AV) ARTEGRAFT ARM  10/09/2018   Procedure: INSERTION OF ARTERIOVENOUS (AV) 7m x 41cm ARTEGRAFT LEFT UPPER ARM;  Surgeon:  CWaynetta Sandy MD;  [place]  Service: Vascular;;  . INSERTION PROSTATE RADIATION SEED    . IR FLUORO GUIDE CV LINE RIGHT  02/18/2018  . IR REMOVAL TUN CV CATH W/O FL  12/06/2018  . IR [place]ASC ACCESS RIGHT  02/18/2018  . LEFT HEART CATH AND CORONARY ANGIOGRAPHY N/A 02/20/2018   Procedure: LEFT HEART CATH AND CORONARY ANGIOGRAPHY;  Surgeon: VJettie Booze MD;  [facility];  Service: Cardiovascular;  Laterality: N/A;  . NM MYOCAR PERF WALL MOTION  02/21/2010   normal  . RADIOACTIVE SEED IMPLANT  2010   prostate cancer  . THROMBECTOMY W/ EMBOLECTOMY Left 02/26/2018   Procedure: THROMBECTOMY ARTERIOVENOUS GRAFT;  Surgeon: DAngelia Mould MD;  [place]  Service: Vascular;  Laterality: Left;  . ULTRASOUND GUIDANCE FOR VASCULAR ACCESS  02/20/2018   Procedure: Ultrasound Guidance For Vascular Access;  Surgeon: VJettie Booze MD;  [facility];  Service: Cardiovascular;;  . UPPER EXTREMITY VENOGRAPHY N/A 07/08/2018   Procedure: UPPER EXTREMITY VENOGRAPHY;  Surgeon: CWaynetta Sandy MD;  [place] [place];  Service: Cardiovascular;  Laterality: N/A;  Bilateral  . US ECHOCARDIOGRAPHY  02/06/2012   mild LVH,LA mod. dilated,mild-mod. 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 AI.     Allergies  Allergies  Allergen Reactions  . Cozaar [Losartan Potassium] Other (See Comments)    Causes constipation     Inpatient Medications    . allopurinol  100 mg Oral Daily  . amiodarone  200 mg Oral Daily  . bictegravir-emtricitabine-tenofovir AF  1 tablet Oral Daily  . brimonidine  1 drop Left Eye BID  . chlorhexidine gluconate (MEDLINE KIT)  15 mL Mouth Rinse BID  . Chlorhexidine Gluconate Cloth  6 each Topical Q0600  . dorzolamide-timolol  1 drop Left Eye BID  . fentaNYL (SUBLIMAZE) injection  50 mcg Intravenous Once  . insulin aspart  0-9 Units Subcutaneous Q4H  . latanoprost  1  drop Left Eye QHS  . mouth rinse  15 mL Mouth Rinse 10 times per day  . midodrine  10 mg Oral 2 times per day on Tue Thu Sat  . pantoprazole (PROTONIX) IV  40 mg Intravenous QHS    Family History    Family History  Problem Relation Age of Onset  . Hypertension Mother   . Thyroid disease Mother   . Cholelithiasis Daughter   . Cholelithiasis Son   . Hypertension Maternal Grandmother   . Diabetes Maternal Grandmother   . Heart attack Neg Hx   . Hyperlipidemia Neg Hx    He indicated that his mother is deceased. He indicated that his father is deceased. He indicated that the status of his maternal grandmother is unknown. He indicated that his daughter is alive. He indicated that his son is alive. He indicated that the status of his neg hx is unknown.   Social History    Social History   Socioeconomic History  . Marital status: Married    Spouse name: Not on file  . Number of children: 2  . Years of education: 11  . Highest education level: Not on file  Occupational History  . Occupation: retired, picks up Aeronautical engineer: RETIRED  Social Needs  . Financial resource strain: Not on file  . Food insecurity:    Worry: Not on file    Inability: Not on file  . Transportation needs:    Medical: Not on file    Non-medical: Not on file  Tobacco Use  . Smoking status: Former Smoker    Packs/day: 1.00    Years: 30.00    Pack years: 30.00    Types: Cigarettes    Last attempt to quit: 2006    Years since quitting: 14.2  . Smokeless tobacco: Never Used  Substance and Sexual Activity  . Alcohol use: Yes    Comment: once a week-wine  . Drug use: Never  . Sexual activity: Not Currently  Lifestyle  . Physical activity:    Days per week: Not on file    Minutes per session: Not on file  . Stress: Not on file  Relationships  . Social connections:    Talks on phone: Not on file    Gets together: Not on file    Attends religious service: Not on file    Active member of  club or organization: Not on file    Attends meetings of clubs or organizations: Not on file    Relationship status: Not on file  . Intimate partner violence:    Fear of  current or ex partner: Not on file    Emotionally abused: Not on file    Physically abused: Not on file    Forced sexual activity: Not on file  Other Topics Concern  . Not on file  Social History Narrative   ** Merged History Encounter **       Stays active at home Regular exercise: no Drinks 2 cups of coffee a week, 1 mountain dew soda a day.     Review of Systems    Please see HPI.   Physical Exam   Physical Exam per [name]:  Blood pressure (!) 87/57, pulse 75, temperature 97.7 F (36.5 C), temperature source Oral, resp. rate 20, height 6' 2"  (1.88 m), weight 83 kg, SpO2 93 %.  General: 78 y.o. male resting comfortably in no acute distress. Pleasant and cooperative. HEENT: Normal  Neck: Supple. No carotid bruits or JVD appreciated. Lungs: No increased work of breathing. Clear to auscultation bilaterally. No wheezes, rhonchi, or rales. Heart: RRR. Distinct S1 and S2. No murmurs, gallops, or rubs.  Abdomen: Soft, non-distended, and non-tender to palpation. Bowel sounds present in all 4 quadrants.   Extremities: No clubbing, cyanosis or edema. Radial, posterior tibial, and distal pedal pulses 2+ and equal bilaterally. Skin: Warm and dry. Neuro: Alert and oriented x3. No focal deficits. Moves all extremities spontaneously. Psych: Normal affect.    Labs    Troponin (Point of Care Test) No results for input(s): TROPIPOC in the last 72 hours. Recent Labs    03/04/19 0514  TROPONINI 0.08*   Lab Results  Component Value Date   WBC 8.6 03/05/2019   HGB 12.0 (L) 03/05/2019   HCT 36.6 (L) 03/05/2019   MCV 107.3 (H) 03/05/2019   PLT 217 03/05/2019    Recent Labs  Lab 03/04/19 0514  03/05/19 0152  NA 140   < > 136  K 4.0   < > 4.4  CL 96*  --  93*  CO2 19*  --  27  BUN 72*  --  41*   CREATININE 11.44*  --  8.18*  CALCIUM 9.4  --  9.3  PROT 8.3*  --   --   BILITOT 0.8  --   --   ALKPHOS 116  --   --   ALT 28  --   --   AST 30  --   --   GLUCOSE 236*  --  153*   < > = values in this interval not displayed.   Lab Results  Component Value Date   CHOL 221 (H) 12/18/2018   HDL 45 12/18/2018   LDLCALC 145 (H) 12/18/2018   TRIG 170 (H) 12/18/2018   Lab Results  Component Value Date   DDIMER 1.56 (H) 05/12/2014     Radiology Studies    Dg Chest Port 1 View  Result Date: 03/05/2019 CLINICAL DATA:  Follow-up endotracheal tube EXAM: PORTABLE CHEST 1 VIEW COMPARISON:  03/04/2019 FINDINGS: Cardiac shadow remains enlarged. Endotracheal tube is noted in satisfactory position. Gastric catheter is noted extending into the stomach. The previously seen vascular congestion and edema has resolved in the interval. No focal infiltrate or sizable effusion is noted. No bony abnormality is seen. IMPRESSION: Interval resolution of CHF. Electronically Signed   By: Inez Catalina M.D.   On: 03/05/2019 07:54   Dg Chest Portable 1 View  Result Date: 03/04/2019 CLINICAL DATA:  Respiratory distress EXAM: PORTABLE CHEST 1 VIEW COMPARISON:  02/10/2019 FINDINGS: Endotracheal tube tip just  below the clavicular heads. The orogastric tube reaches the stomach at least. Cardiomegaly with diffuse interstitial and airspace opacity. The airspace opacity is symmetric and there are [employer]. IMPRESSION: 1. Hardware in unremarkable position. 2. CHF pattern. Electronically Signed   By: Monte Fantasia M.D.   On: 03/04/2019 06:23   Dg Chest Portable 1 View  Result Date: 02/10/2019 CLINICAL DATA:  Shortness of breath EXAM: PORTABLE CHEST 1 VIEW COMPARISON:  12/04/2018, 04/30/2018 FINDINGS: Cardiomegaly with vascular congestion and diffuse bilateral interstitial and ground-glass opacity suspicious for pulmonary edema. Patchy consolidation at both lung bases. No pneumothorax. IMPRESSION: Cardiomegaly with  vascular congestion and diffuse interstitial and ground-glass opacities suspicious for pulmonary edema. Confluent airspace disease at the lung bases may reflect atelectasis or superimposed pneumonia Electronically Signed   By: Donavan Foil M.D.   On: 02/10/2019 23:53    EKG     EKG: EKG was personally reviewed and demonstrates: sinus tachycardia, LBBB Telemetry: Telemetry was personally reviewed and demonstrates: NSR  Cardiac Imaging    Echocardiogram 03/04/2019:  1. The left ventricle has severely reduced systolic function, with an ejection fraction of 20-25%. The cavity size was moderately dilated. Left ventricular diastolic function could not be evaluated. Left ventricular diffuse hypokinesis.  2. No LV apical thrombus noted with Definity contrast enhancement.  3. The aortic valve was not well visualized. Aortic valve regurgitation is mild by color flow Doppler. _______________  Left Heart Catheterization 02/20/2018:  Ost 2nd Mrg to 2nd Mrg lesion is 50% stenosed.  Mid LAD lesion is 25% stenosed.  LV end diastolic pressure is normal. LVEDP 6 mm Hg.  There is no aortic valve stenosis.   Nonobstructive CAD.  Continue medical therapy.    Assessment & Plan    Acute on Chronic Combine CHF/ Nonischemic Cardiomyopathy - Patient was admitted again for acute pulmonary edema ultimately requiring intubation. Patient underwent dialysis session yesterday and 2L of fluid was removed. Patient was able to be extubated earlier today. Given recurrent episodes of acute pulmonary edema with hypoxic respiratory failure. Cardiology was consulted.  - BNP elevated at 1, 041.5 on admission (was 1,054.2 on recent admission). - Echo his admission showed moderately dilated LV with EF of 20-25% with diffuse hypokinesis.  - Left heart catheterization in 02/2018 showed non-obstructive CAD. - Arrhythmia is not the cause of his current decompensation. - Not a candidate for CRT, LVAD or transplantation due to  comorbid conditions (ESRD, HIV, etc.) and high risk of complications with advanced therapies. - I do not know how much we have to offer at this time because it appears that he has been unable to tolerate any heart failure medications in the past.  Volume management per hemodialysis, limited by low BP. - Recommend increasing the HD day dose of proAmatine to 10 mg in AM and 10 mg at noon, continue 5 mg daily on non-HD days. Try to drop dry weight by another 1 kg on this new vasoconstrictor dose.    Minimally Elevated Troponin - Initial troponin minimally elevated at 0.08 on admission. Per chart review, troponin appears to be chronically elevated around 0.6 to 0.8 likely due to ESRD. - Non-obstructive CAD was noted on recent cardiac catheterization in 02/2018. - No ischemic work-up planned at this time.   Paroxysmal Atrial Fibrillation - Telemetry shows NSR - Continue Amiodarone and Coumadin.  CAD - Left heart catheterization in 02/2018 showed non-obstructive CAD with 50% stenosis of ostial 2nd marginal to 2nd marginal and 25% stenosis of the mid LAD.  ESRD on Hemodialysis Tuesdays/Thursdays/Saturadatys - Requires midodrine for hypotension, higher dose on HD days (proAmatine 10 mg in AM and 10 mg at noon on HD days, continue 5 mg daily in AM on non-HD days.) - I would suggest trying to challenge dry weight to 83 kg.  Signed, Darreld Mclean, PA-C 03/05/2019, 11:16 AM  For questions or updates, please contact   Please consult [email or web address] for contact info under Cardiology/STEMI.

## 2019-03-05 NOTE — Progress Notes (Addendum)
NAME:  David [name], MRN:  [id number], DOB:  16-Dec-1940, LOS: 1 ADMISSION DATE:  03/04/2019, CONSULTATION DATE:  03/04/19 REFERRING MD:  Ward - EM , CHIEF COMPLAINT:  SOB   Brief History   78 year old who presented after iHD non-compliance with pulmonary edema. Vomited on BiPAP, intubated. Fluid removed.   History of present illness   78 y.o. 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 He arrived in resp distress, placed on BIPAP but vomited and had to be intubated. He was here 2 weeks ago with similar presentation. It appears to have been an acute episode of SOB , does not seem he was feeling sick in the past couple of days but hx is limited as during my evaluation the patient is already intubated, the hx is gathered by the ED pre-intubation. No family available.  Past Medical History   Acute on chronic systolic and diastolic heart failure, NYHA class 4 ([place])    . Anemia, iron deficiency 11/15/2011  . Arthritis    "hands, right knee, feet" (02/21/2018)  . Atrial fibrillation ([place])   . Cancer [facility])    hx of prostate; s/p radioactive seed implant 10/2009 Dr Janice Norrie  . Cardiac arrest ([place]) 02/17/2018  . CHF (congestive heart failure) ([place])   . Chronic lower back pain   . CKD (chronic kidney disease) stage V requiring chronic dialysis ([place])   . ESRD on dialysis ([place])    started 02/2018  . GERD (gastroesophageal reflux disease)   . Glaucoma   . Gout    daily RX (02/21/2018)  . Heart murmur    "mild" per pt  . Hepatitis    years ago  . History of cardiac cath 2004   negative for CAD  . History of myocardial perfusion scan 02/2010   negative for coronary  insufficiency (LVEF 27%)  . HIV (human immunodeficiency virus infection) ([place])   . HIV infection ([place])   . Hyperlipidemia   . Hypertension    followed by [facility] and Vascular (Dr Dani Gobble Croitoru)  . Hypertension   . Nonischemic cardiomyopathy ([place])   . Pneumonia 11/2017  . Prostate cancer ([place])   . Sinus bradycardia   . Sleep apnea    does not use a cpap  . Stroke [facility])    "mini stroke" years ago  . Systolic and diastolic CHF, acute ([place]) 06/2010   felt to be secondary to hypertensive cardiomyopathy     Significant Hospital Events   3/31 admitted   Consults:  Nephrology   Procedures:  3/31 ETT>>   Significant Diagnostic Tests:  CXR 3/31 - diffuse pulmonary edema CXR 4/1 - improved pulmonary edema, personally reviewed   Micro Data:  RBP 3/31> neg  Antimicrobials:  [name]  Interim history/subjective:  Tolerating SBT this morning well following fluid removal yesterday   Objective   Blood pressure 105/69, pulse 82, temperature 98.9 F (37.2 C), temperature source Oral, resp. rate 20, height 6\' 2"  (1.88 m), weight 83 kg, SpO2 100 %.    Vent Mode: PSV;CPAP FiO2 (%):  [40 %-50 %] 40 % Set Rate:  [16 bmp-18 bmp] 16 bmp Vt Set:  [650 mL] 650 mL PEEP:  [5 cmH20-10 cmH20] 5 cmH20  Pressure Support:  [8 [place] Pressure:  [19 cmH20-26 cmH20] 20 cmH20   Intake/Output Summary (Last 24 hours) at 03/05/2019 0758 Last data filed at 03/05/2019 0700 Gross per 24 hour  Intake 203.55 ml  Output 2314 ml  Net -2110.45 ml   Filed Weights   03/04/19 1126 03/04/19 1530 03/05/19 0500  Weight: 83.5 kg 81.2 kg 83 kg    Examination: General: Chronically ill appearing adult male, intubated, calm, NAD  HENT: NCAT, ETT OGT secure, trachea midline, pink mmm Lungs: Symmetrical chest wall expansion, Left basilar rhonchi, No accessory muscle recruitment  Cardiovascular: irregular rhythm, s1s2 no r/g/m, 2+ pulses, capillary refill < 3 seconds   Abdomen: soft, rond, ndnt, normoactive x4  Extremities: symmetrical bulk and tone, trace BLE edema  Neuro: Awake, alert, following commands. PERRL  Skin: clean, dry, warm, intact without rash or abrasion   Resolved Hospital Problem list     Assessment & Plan:   Acute on chronic systolic heart failure -  hx of non isquemic CMP   cath last year EF 25% with non obs CAD If hx of compliance with HD is true then I suspect he might have been going in and out of AFIB WITH RVR causing pulm edema, had similar problem 2 weeks ago and resolved with HD. P Continue tele Fluid removal per nephrology as below  Acute resp failure with hypoxemia and hypercapnea secondary to above -emesis on BiPAP, intubated 3/31 -low suspicion for infective process, suspect pulmonary edema P Fluid removal per nephro as below RVP negative  AM WUA/SBT Tolerating SBT this morning, will assess if feasible for extubation today If unable continue PRVC, wean as able Continue pulm hygiene  AM CXR   ESRD on HD - -non-compliant OP iHD P  Nephrology following, appreciate iHD recs S/p 2L fluid removal 4/40 Trend metabolic panel, address electrolyte abnormalities PRN  Midondrine T/R/Sa BID   Atrial Fibrilaltion -home amiodarone -home warfarin  P Continue amiodarone Contine telemetry  INR remains therapeutic 4/1, hold again today Daily INR, restart when clinically appropriate   HIV - P Continue home biktarvy   Hyperglycemia -no Hx DM P SSI   Best practice:  Diet:  NPO  Pain/Anxiety/Delirium protocol (if indicated): PRN fent, fent gtt  VAP protocol (if indicated): Yes  DVT prophylaxis: SCDs GI prophylaxis: protonix Glucose control: SSI  Mobility: bedrest  Code Status: Full  Family Communication: none Disposition: ICU    Labs   CBC: Recent Labs  Lab 03/04/19 0514 03/04/19 0841 03/04/19 1144 03/05/19 0152  WBC 12.9*  --   --  8.6  NEUTROABS 7.1  --   --   --   HGB 13.8 13.9 12.6* 12.0*   HCT 44.8 41.0 37.0* 36.6*  MCV 113.1*  --   --  107.3*  PLT 294  --   --  102    Basic Metabolic Panel: Recent Labs  Lab 03/04/19 0514 03/04/19 0841 03/04/19 1144 03/05/19 0152  NA 140 138 138 136  K 4.0 4.3 4.5 4.4  CL 96*  --   --  93*  CO2 19*  --   --  27  GLUCOSE 236*  --   --  153*  BUN 72*  --   --  41*  CREATININE 11.44*  --   --  8.18*  CALCIUM 9.4  --   --  9.3  MG  --   --   --  2.3  PHOS  --   --   --  4.4   GFR: Estimated Creatinine Clearance: 8.9 mL/min (A) (by C-G formula based on SCr of 8.18 mg/dL (H)). Recent Labs  Lab 03/04/19 0514 03/05/19 0152  WBC 12.9* 8.6    Liver Function Tests: Recent Labs  Lab 03/04/19 0514  AST 30  ALT 28  ALKPHOS 116  BILITOT 0.8  PROT 8.3*  ALBUMIN 4.2   No results for input(s): LIPASE, AMYLASE in the last 168 hours. No results for input(s): AMMONIA in the last 168 hours.  ABG    Component Value Date/Time   PHART 7.482 (H) 03/04/2019 1144   PCO2ART 38.6 03/04/2019 1144   PO2ART 190.0 (H) 03/04/2019 1144   HCO3 29.0 (H) 03/04/2019 1144   TCO2 30 03/04/2019 1144   ACIDBASEDEF 5.0 (H) 02/17/2018 0413   O2SAT 100.0 03/04/2019 1144     Coagulation Profile: Recent Labs  Lab 03/04/19 0924 03/05/19 0152  INR 2.0* 2.0*    Cardiac Enzymes: Recent Labs  Lab 03/04/19 0514  TROPONINI 0.08*    HbA1C: Hgb A1c MFr Bld  Date/Time Value Ref Range Status  01/21/2018 04:52 AM 6.1 (H) 4.8 - 5.6 % Final    Comment:    (NOTE) Pre diabetes:          5.7%-6.4% Diabetes:              >6.4% Glycemic control for   <7.0% adults with diabetes   06/28/2016 09:15 AM 5.8 4.6 - 6.5 % Final    Comment:    Glycemic Control Guidelines for People with Diabetes:Non Diabetic:  <6%Goal of Therapy: <7%Additional Action Suggested:  >8%     CBG: Recent Labs  Lab 03/04/19 1522 03/04/19 2051 03/05/19 0018 03/05/19 0105 03/05/19 0443  GLUCAP 84 89 48* 169* 87    Critical care time: 40 minutes    Eliseo Gum MSN,  AGACNP-BC [place] [phone] If no answer, [phone] 03/05/2019, 7:59 AM

## 2019-03-05 NOTE — Progress Notes (Signed)
Inpatient Diabetes Program Recommendations  AACE/ADA: New Consensus Statement on Inpatient Glycemic Control (2015)  Target Ranges:  Prepandial:   less than 140 mg/dL      Peak postprandial:   less than 180 mg/dL (1-2 hours)      Critically ill patients:  140 - 180 mg/dL   Lab Results  Component Value Date   GLUCAP 96 03/05/2019   HGBA1C 6.1 (H) 01/21/2018    Review of Glycemic Control Results for David [name], David [name] (MRN [id number]) as of 03/05/2019 12:54  Ref. Range 03/05/2019 00:18 03/05/2019 01:05 03/05/2019 04:43 03/05/2019 08:14 03/05/2019 12:10  Glucose-Capillary Latest Ref Range: 70 - 99 mg/dL 48 (L) 169 (H) 87 97 96   Outpatient Diabetes medications:  None Current orders for Inpatient glycemic control:  Novolog sensitive q 4 hours Inpatient Diabetes Program Recommendations:   Please consider d/c of Novolog correction.    Thanks,  Adah Perl, RN, BC-ADM Inpatient Diabetes Coordinator Pager [phone] (8a-5p)

## 2019-03-05 NOTE — Progress Notes (Signed)
Approximately 200cc of remaining fentanyl drip wasted with Rutha Bouchard RN in the stericycle. Patient is swallowing well and has a clear voice.

## 2019-03-05 NOTE — Progress Notes (Signed)
Patient admitted to [facility] from [facility] via wheelchair.  Patient alert and oriented. SCD's applied bilaterally. Call bell at side. Oriented to room and advised that he would receive his dialysis tomorrow as scheduled.  Patient is alert and oriented x4.   Patient and transferring RN acknowledge that patient has not had any lunch at this time.. tray ordered.

## 2019-03-05 NOTE — Evaluation (Signed)
Physical Therapy Evaluation Patient Details Name: David [name] MRN: [id number] DOB: September 04, 1941 Today's Date: 03/05/2019   History of Present Illness  Pt is a 78 y.o. M with significant PMH of HIV, paroxysmal atrial fibrillation, end stage renal disease on hemodialysis, chronic systolic CHF with EF 34-28%, who presents with shortness of breath. Intubated 3/31-4/1  Clinical Impression  Pt admitted with above diagnosis. Pt currently with functional limitations due to the deficits listed below (see PT Problem List). Pt received sitting on edge of bed, performing bathing task. On PT evaluation, pt transferred to chair with cane and min guard assist. SpO2 > 95% on RA. Suspect pt will progress well. Pt will benefit from skilled PT to increase their independence and safety with mobility to allow discharge to the venue listed below.       Follow Up Recommendations Home health PT (may not need if progresses quickly)    Equipment Recommendations  None recommended by PT    Recommendations for Other Services       Precautions / Restrictions Precautions Precautions: Fall Restrictions Weight Bearing Restrictions: No      Mobility  Bed Mobility               General bed mobility comments: Sitting on EOB upon arrival  Transfers Overall transfer level: Needs assistance Equipment used: None Transfers: Sit to/from Stand Sit to Stand: Min assist         General transfer comment: light min assist to stand  Ambulation/Gait Ambulation/Gait assistance: Min guard Gait Distance (Feet): 3 Feet Assistive device: Straight cane Gait Pattern/deviations: Step-through pattern;Decreased stride length Gait velocity: decr   General Gait Details: Pt mildly unsteady with dynamic balance, requiring min guard assist   Stairs            Wheelchair Mobility    Modified Rankin (Stroke Patients Only)       Balance Overall balance assessment: Needs assistance Sitting-balance  support: Feet supported Sitting balance-Leahy Scale: Good     Standing balance support: Single extremity supported;During functional activity Standing balance-Leahy Scale: Fair                               Pertinent Vitals/Pain Pain Assessment: No/denies pain    Home Living Family/patient expects to be discharged to:: Private residence Living Arrangements: Spouse/significant other;Children Available Help at Discharge: Family Type of Home: House Home Access: Stairs to enter Entrance Stairs-Rails: Right Entrance Stairs-Number of Steps: 3   Home Equipment: Environmental consultant - 2 wheels;Cane - single point      Prior Function Level of Independence: Independent with assistive device(s)         Comments: modI with SPC     Hand Dominance        Extremity/Trunk Assessment   Upper Extremity Assessment Upper Extremity Assessment: Overall WFL for tasks assessed    Lower Extremity Assessment Lower Extremity Assessment: Overall WFL for tasks assessed    Cervical / Trunk Assessment Cervical / Trunk Assessment: Normal  Communication   Communication: No difficulties  Cognition Arousal/Alertness: Awake/alert Behavior During Therapy: Flat affect Overall Cognitive Status: Within Functional Limits for tasks assessed                                        General Comments      Exercises General Exercises - Lower Extremity Long Arc  Quad: 10 reps;Both;Seated Hip Flexion/Marching: 10 reps;Both;Seated Heel Raises: 20 reps;Both;Seated   Assessment/Plan    PT Assessment Patient needs continued PT services  PT Problem List Decreased strength;Decreased activity tolerance;Decreased balance;Decreased mobility       PT Treatment Interventions DME instruction;Gait training;Stair training;Functional mobility training;Therapeutic exercise;Therapeutic activities;Balance training;Patient/family education    PT Goals (Current goals can be found in the Care Plan  section)  Acute Rehab PT Goals Patient Stated Goal: "go home tomorrow." PT Goal Formulation: With patient Time For Goal Achievement: 03/19/19 Potential to Achieve Goals: Good    Frequency Min 3X/week   Barriers to discharge        Co-evaluation               AM-PAC PT "6 Clicks" Mobility  Outcome Measure Help needed turning from your back to your side while in a flat bed without using bedrails?: None Help needed moving from lying on your back to sitting on the side of a flat bed without using bedrails?: None Help needed moving to and from a bed to a chair (including a wheelchair)?: A Little Help needed standing up from a chair using your arms (e.g., wheelchair or bedside chair)?: A Little Help needed to walk in hospital room?: A Little Help needed climbing 3-5 steps with a railing? : A Little 6 Click Score: 20    End of Session   Activity Tolerance: Patient tolerated treatment well Patient left: in chair;with call bell/phone within reach Nurse Communication: Mobility status PT Visit Diagnosis: Unsteadiness on feet (R26.81);Difficulty in walking, not elsewhere classified (R26.2)    Time: 0865-7846 PT Time Calculation (min) (ACUTE ONLY): 22 min   Charges:   PT Evaluation $PT Eval Moderate Complexity: 1 Mod     Ellamae Sia, [place], DPT Acute Rehabilitation Services Pager [phone] Office [phone]  Willy Eddy 03/05/2019, 1:12 PM

## 2019-03-05 NOTE — Procedures (Signed)
Extubation Procedure Note  Patient Details:   Name: [facility] DOB: [birth year] MRN: [id number]   Airway Documentation:    Vent end date: 03/05/19 Vent end time: 0956   Evaluation  O2 sats: stable throughout Complications: No apparent complications Patient did tolerate procedure well. Bilateral Breath Sounds: Clear, Diminished   Yes  Patient was extubated to 2 LPM nasal cannula. Patient had positive cuff leak. Patient had Strong productive cough and was able to say name. BBS clear and diminished. No stridor noted. Vitals stable. RN at bedside.   Retal Tonkinson M 03/05/2019, 9:59 AM

## 2019-03-05 NOTE — Progress Notes (Signed)
Patient blood sugar 120 no coverage plan to check every 4 hours hx of low bloodsugars

## 2019-03-05 NOTE — Progress Notes (Signed)
[facility] Progress Note  Subjective:- 3 L on HD yest, today's CXR has cleared   Vitals:   03/05/19 1030 03/05/19 1100 03/05/19 1200 03/05/19 1300  BP: 108/68 106/62 111/71 (!) 87/57  Pulse: 77 77 76 75  Resp: 19 20    Temp:   97.7 F (36.5 C)   TempSrc:   Oral   SpO2: 100% 100% 96% 93%  Weight:      Height:        Inpatient medications: . allopurinol  100 mg Oral Daily  . amiodarone  200 mg Oral Daily  . bictegravir-emtricitabine-tenofovir AF  1 tablet Oral Daily  . brimonidine  1 drop Left Eye BID  . Chlorhexidine Gluconate Cloth  6 each Topical Q0600  . dorzolamide-timolol  1 drop Left Eye BID  . latanoprost  1 drop Left Eye QHS  . midodrine  10 mg Oral 2 times per day on Tue Thu Sat  . pantoprazole (PROTONIX) IV  40 mg Intravenous QHS    acetaminophen, albuterol, ondansetron (ZOFRAN) IV  Iron/TIBC/Ferritin/ %Sat    Component Value Date/Time   IRON 27 (L) 02/22/2018 0752   TIBC 218 (L) 02/22/2018 0752   FERRITIN 910 (H) 02/22/2018 0752   IRONPCTSAT 12 (L) 02/22/2018 0752   IRONPCTSAT 32 03/06/2013 1138    Exam: Gen on vent, alert  No rash, cyanosis or gangrene Sclera anicteric, throat w ETT  No jvd or bruits, flat neck veins Chest clear bilat no rales RRR no MRG Abd firm, mod distended, +bs Ext trace bilat LE edema Neuro is alert, Ox 3 , nf   Home meds:  - allopurinol 100/ auryxia tid ac  - warfarin as directed qd/ amiodarone 200 qd  - midodrine 10mg  pre HD TTS  - bictegravir-emtricitabine-tenofovir 1 qd    [place] TTS  4h  2/2 bath  84kg   400/800  P2  RUA AVF  Hep 5800 - mircera 75 ug last on 6/21 - calictriol 0.5 tiw   Assessment: 1. Acute resp failure / SOB - presentation c/w pulm edema. CXR cleared after HD yest w/ 3 L off. Slightly under dry wt today. Extubation planned for this am.  No new rec's.  2. BP/ volume - under dry slightly, no antiHTN meds at home, on midodrine pre HD tiw  3. ESRD on HD TTS - plan for HD tomorrow,  UF as tolerated 4. NICM EF 25% 5. HIV - on ART 6. Anemia - recent esa on 3/26, Hb good =12   [facility] 03/05/2019, 2:00 PM  Recent Labs  Lab 03/04/19 0514  03/04/19 0924 03/04/19 1144 03/05/19 0152  NA 140   < >  --  138 136  K 4.0   < >  --  4.5 4.4  CL 96*  --   --   --  93*  CO2 19*  --   --   --  27  GLUCOSE 236*  --   --   --  153*  BUN 72*  --   --   --  41*  CREATININE 11.44*  --   --   --  8.18*  CALCIUM 9.4  --   --   --  9.3  PHOS  --   --   --   --  4.4  ALBUMIN 4.2  --   --   --   --   INR  --   --  2.0*  --  2.0*   < > =  values in this interval not displayed.   Recent Labs  Lab 03/04/19 0514  AST 30  ALT 28  ALKPHOS 116  BILITOT 0.8  PROT 8.3*   Recent Labs  Lab 03/04/19 0514  03/04/19 1144 03/05/19 0152  WBC 12.9*  --   --  8.6  NEUTROABS 7.1  --   --   --   HGB 13.8   < > 12.6* 12.0*  HCT 44.8   < > 37.0* 36.6*  MCV 113.1*  --   --  107.3*  PLT 294  --   --  217   < > = values in this interval not displayed.

## 2019-03-06 LAB — BASIC METABOLIC PANEL
Anion gap: 19 — ABNORMAL HIGH (ref 5–15)
BUN: 88 mg/dL — ABNORMAL HIGH (ref 8–23)
CO2: 26 mmol/L (ref 22–32)
Calcium: 9.2 mg/dL (ref 8.9–10.3)
Chloride: 93 mmol/L — ABNORMAL LOW (ref 98–111)
Creatinine, Ser: 11.53 mg/dL — ABNORMAL HIGH (ref 0.61–1.24)
GFR calc Af Amer: 4 mL/min — ABNORMAL LOW (ref >60)
GFR calc non Af Amer: 4 mL/min — ABNORMAL LOW (ref >60)
Glucose, Bld: 112 mg/dL — ABNORMAL HIGH (ref 70–99)
Potassium: 4.5 mmol/L (ref 3.5–5.1)
Sodium: 138 mmol/L (ref 135–145)

## 2019-03-06 LAB — PROTIME-INR
INR: 1.8 — ABNORMAL HIGH (ref 0.8–1.2)
Prothrombin Time: 20.8 seconds — ABNORMAL HIGH (ref 11.4–15.2)

## 2019-03-06 LAB — MAGNESIUM: Magnesium: 2.4 mg/dL (ref 1.7–2.4)

## 2019-03-06 LAB — GLUCOSE, CAPILLARY
Glucose-Capillary: 112 mg/dL — ABNORMAL HIGH (ref 70–99)
Glucose-Capillary: 114 mg/dL — ABNORMAL HIGH (ref 70–99)
Glucose-Capillary: 87 mg/dL (ref 70–99)

## 2019-03-06 LAB — PHOSPHORUS: Phosphorus: 5.8 mg/dL — ABNORMAL HIGH (ref 2.5–4.6)

## 2019-03-06 LAB — TSH: TSH: 1.17 u[IU]/mL (ref 0.350–4.500)

## 2019-03-06 MED ORDER — WARFARIN SODIUM 5 MG PO TABS
5.0000 mg | ORAL_TABLET | Freq: Every day | ORAL | Status: DC
  Administered 2019-03-06: 5 mg via ORAL
  Filled 2019-03-06: qty 1

## 2019-03-06 MED ORDER — WARFARIN - PHARMACIST DOSING INPATIENT: Freq: Every day | Status: DC

## 2019-03-06 MED ORDER — HEPARIN SODIUM (PORCINE) 1000 UNIT/ML IJ SOLN
INTRAMUSCULAR | Status: AC
  Filled 2019-03-06: qty 1

## 2019-03-06 MED ORDER — PANTOPRAZOLE SODIUM 40 MG PO TBEC: 40.0000 mg | DELAYED_RELEASE_TABLET | Freq: Every day | ORAL | Status: DC

## 2019-03-06 MED ORDER — MIDODRINE HCL 5 MG PO TABS
ORAL_TABLET | ORAL | Status: AC
  Administered 2019-03-06: 10 mg via ORAL
  Filled 2019-03-06: qty 2

## 2019-03-06 MED ORDER — NEPRO/CARBSTEADY PO LIQD
237.0000 mL | Freq: Every day | ORAL | Status: DC
  Filled 2019-03-06: qty 237

## 2019-03-06 MED ORDER — HEPARIN SODIUM (PORCINE) 1000 UNIT/ML DIALYSIS
5800.0000 [IU] | Freq: Once | INTRAMUSCULAR | Status: DC
  Filled 2019-03-06: qty 6

## 2019-03-06 MED ORDER — MIDODRINE HCL 10 MG PO TABS: 10.0000 mg | ORAL_TABLET | ORAL | 0 refills | Status: DC

## 2019-03-06 NOTE — Progress Notes (Signed)
ANTICOAGULATION CONSULT NOTE - Initial Consult  Pharmacy Consult for Coumadin Indication: atrial fibrillation  Allergies  Allergen Reactions  . Cozaar [Losartan Potassium] Other (See Comments)    Causes constipation     Patient Measurements: Height: 6\' 2"  (188 cm) Weight: 181 lb 9.6 oz (82.4 kg) IBW/kg (Calculated) : 82.2  Vital Signs: Temp: 97.5 F (36.4 C) (04/02 0759) Temp Source: Oral (04/02 0759) BP: 117/69 (04/02 0759) Pulse Rate: 72 (04/02 0759)  Labs: Recent Labs    03/04/19 0514 03/04/19 0841 03/04/19 0924 03/04/19 1144 03/05/19 0152 03/06/19 0952  HGB 13.8 13.9  --  12.6* 12.0*  --   HCT 44.8 41.0  --  37.0* 36.6*  --   PLT 294  --   --   --  217  --   LABPROT  --   --  22.1*  --  22.4* 20.8*  INR  --   --  2.0*  --  2.0* 1.8*  CREATININE 11.44*  --   --   --  8.18* 11.53*  TROPONINI 0.08*  --   --   --   --   --     Estimated Creatinine Clearance: 6.3 mL/min (A) (by C-G formula based on SCr of 11.53 mg/dL (H)).   Medical History: Past Medical History:  Diagnosis Date  . Acute on chronic systolic and diastolic heart failure, NYHA class 4 ([place])   . Anemia, iron deficiency 11/15/2011  . Arthritis    "hands, right knee, feet" (02/21/2018)  . Atrial fibrillation ([place])   . Cancer [facility])    hx of prostate; s/p radioactive seed implant 10/2009 Dr Janice Norrie  . Cardiac arrest ([place]) 02/17/2018  . CHF (congestive heart failure) ([place])   . Chronic lower back pain   . CKD (chronic kidney disease) stage V requiring chronic dialysis ([place])   . ESRD on dialysis ([place])    started 02/2018  . GERD (gastroesophageal reflux disease)   . Glaucoma   . Gout    daily RX (02/21/2018)  . Heart murmur    "mild" per pt  . Hepatitis    years ago  . History of cardiac cath 2004   negative for CAD  . History of myocardial perfusion scan 02/2010   negative for coronary insufficiency (LVEF 27%)  . HIV (human immunodeficiency virus infection) ([place])   . HIV infection ([place])   .  Hyperlipidemia   . Hypertension    followed by [facility] and Vascular (Dr Dani Gobble Croitoru)  . Hypertension   . Nonischemic cardiomyopathy ([place])   . Pneumonia 11/2017  . Prostate cancer ([place])   . Sinus bradycardia   . Sleep apnea    does not use a cpap  . Stroke [facility])    "mini stroke" years ago  . Systolic and diastolic CHF, acute ([place]) 06/2010   felt to be secondary to hypertensive cardiomyopathy    Assessment:  Anticoag: Warf PTA for hx PAF - last dose 3/30. INR 2. Hgb down to 12. *PTA dose = 5mg  daily  Goal of Therapy:  INR 2-3 Monitor platelets by anticoagulation protocol: Yes   Plan:  Resume Coumadin 5mg  po q1800. May need to increase dose temporarily if INR drops. Daily INR.   Yaser Harvill S. Alford Highland, PharmD, [place] Clinical Staff Pharmacist Eilene Ghazi [place] 03/06/2019,11:33 AM

## 2019-03-06 NOTE — Progress Notes (Addendum)
Progress Note  Patient Name: [facility] Date of Encounter: 03/06/2019  Primary Cardiologist: [name], MD  Subjective   Feels well, walked in the hallway without dizziness.  Blood pressure is in the 110-120s/60s on the higher dose of midodrine.  Inpatient Medications    Scheduled Meds: . allopurinol  100 mg Oral Daily  . amiodarone  200 mg Oral Daily  . bictegravir-emtricitabine-tenofovir AF  1 tablet Oral Daily  . brimonidine  1 drop Left Eye BID  . Chlorhexidine Gluconate Cloth  6 each Topical Q0600  . Chlorhexidine Gluconate Cloth  6 each Topical Q0600  . dorzolamide-timolol  1 drop Left Eye BID  . feeding supplement (PRO-STAT SUGAR FREE 64)  30 mL Oral BID  . latanoprost  1 drop Left Eye QHS  . midodrine  10 mg Oral 2 times per day on Tue Thu Sat  . multivitamin  1 tablet Oral QHS  . pantoprazole (PROTONIX) IV  40 mg Intravenous QHS   Continuous Infusions:  PRN Meds: acetaminophen, albuterol, ondansetron (ZOFRAN) IV   Vital Signs    Vitals:   03/06/19 0011 03/06/19 0414 03/06/19 0542 03/06/19 0759  BP: 111/65 (!) 110/55  117/69  Pulse: 78 74  72  Resp: (!) 24 16  18   Temp: 98.1 F (36.7 C) 98.2 F (36.8 C)  (!) 97.5 F (36.4 C)  TempSrc: Oral Oral  Oral  SpO2: 97% 94%  98%  Weight:   82.4 kg   Height:        Intake/Output Summary (Last 24 hours) at 03/06/2019 0810 Last data filed at 03/06/2019 0757 Gross per 24 hour  Intake 964.08 ml  Output 100 ml  Net 864.08 ml   Last 3 Weights 03/06/2019 03/05/2019 03/04/2019  Weight (lbs) 181 lb 9.6 oz 182 lb 15.7 oz 179 lb 0.2 oz  Weight (kg) 82.373 kg 83 kg 81.2 kg     Telemetry    Needs review  Physical Exam  Appears comfortable lying fully supine in bed GEN: No acute distress.  HEENT: Normocephalic, atraumatic, sclera non-icteric. Neck: No JVD or bruits. Cardiac: RRR no murmurs, rubs, or gallops.  Radials/DP/PT 1+ and equal bilaterally.  Respiratory: Clear to auscultation bilaterally.  Breathing is unlabored. GI: Soft, nontender, non-distended, BS +x 4. MS: no deformity. Extremities: No clubbing or cyanosis. No edema. Distal pedal pulses are 2+ and equal bilaterally. Neuro:  AAOx3. Follows commands. Psych:  Responds to questions appropriately with a normal affect.  Labs    Chemistry Recent Labs  Lab 03/04/19 0514 03/04/19 0841 03/04/19 1144 03/05/19 0152  NA 140 138 138 136  K 4.0 4.3 4.5 4.4  CL 96*  --   --  93*  CO2 19*  --   --  27  GLUCOSE 236*  --   --  153*  BUN 72*  --   --  41*  CREATININE 11.44*  --   --  8.18*  CALCIUM 9.4  --   --  9.3  PROT 8.3*  --   --   --   ALBUMIN 4.2  --   --   --   AST 30  --   --   --   ALT 28  --   --   --   ALKPHOS 116  --   --   --   BILITOT 0.8  --   --   --   GFRNONAA 4*  --   --  6*  GFRAA 4*  --   --  7*  ANIONGAP 25*  --   --  16*     Hematology Recent Labs  Lab 03/04/19 0514 03/04/19 0841 03/04/19 1144 03/05/19 0152  WBC 12.9*  --   --  8.6  RBC 3.96*  --   --  3.41*  HGB 13.8 13.9 12.6* 12.0*  HCT 44.8 41.0 37.0* 36.6*  MCV 113.1*  --   --  107.3*  MCH 34.8*  --   --  35.2*  MCHC 30.8  --   --  32.8  RDW 15.5  --   --  15.8*  PLT 294  --   --  217    Cardiac Enzymes Recent Labs  Lab 03/04/19 0514  TROPONINI 0.08*   No results for input(s): TROPIPOC in the last 168 hours.   BNP Recent Labs  Lab 03/04/19 0519  BNP 1,041.5*     DDimer No results for input(s): DDIMER in the last 168 hours.   Radiology    Dg Chest Port 1 View  Result Date: 03/05/2019 CLINICAL DATA:  Follow-up endotracheal tube EXAM: PORTABLE CHEST 1 VIEW COMPARISON:  03/04/2019 FINDINGS: Cardiac shadow remains enlarged. Endotracheal tube is noted in satisfactory position. Gastric catheter is noted extending into the stomach. The previously seen vascular congestion and edema has resolved in the interval. No focal infiltrate or sizable effusion is noted. No bony abnormality is seen. IMPRESSION: Interval resolution of  CHF. Electronically Signed   By: Inez Catalina M.D.   On: 03/05/2019 07:54    Cardiac Studies   2D echo 03/04/19 IMPRESSIONS  1. The left ventricle has severely reduced systolic function, with an ejection fraction of 20-25%. The cavity size was moderately dilated. Left ventricular diastolic function could not be evaluated. Left ventricular diffuse hypokinesis.  2. No LV apical thrombus noted with Definity contrast enhancement.  3. The aortic valve was not well visualized. Aortic valve regurgitation is mild by color flow Doppler.  Patient Profile     78 y.o. 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 He has had recurrent admissions for pulmonary edema with hypoxic respiratory failure requiring BiPAP. He was recently admitted 02/10/19 with one of these episodes, also with possible concern for PNA so he did receive some abx. He was readmitted 03/04/2019 with acute SOB and recurrent hypoxia - was on BiPAP/CPAP but vomited so had to be intubated. Underwent dialysis with improvement. HD has been limited by low BP, requiring midodrine - decompensation has happened after the longer weekend HD interval.  Assessment & Plan    1. Acute recurrent hypoxic respiratory failure with pulmonary edema in the setting of acute on chronic combined CHF/NICM and ESRD - no clear cardiac contribution from acute decompensation. He is not a candidate for CRT, LVAD or transplantation due to comorbid conditions (ESRD, HIV, etc.) and high risk of complications with advanced therapies. Volume management is via hemodialysis, limited by low BP. [name] recommended increasing the HD day dose of proamatine to 10 mg in  AM and 10 mg at noon, continue 5 mg daily on non-HD days.   2. Minimally elevated troponin in setting of nonobstructive CAD 02/2018 - likely demand process, no plan for invasive ischemic w/u.  3. Paroxysmal atrial fibrillation - NSR this admission. Restart  warfarin today. INR 2.0 yesterday, 1.8 today.  Normal LFTs.  Ordered TSH, be rechecked every 6 months while on amiodarone.  4. ESRD on HD T/T/S - per nephrology.  Suggest trying to "challenge his dry weight" down to 83 kg.  For questions or updates, please contact [place] Please consult [email or web address] for contact info under Cardiology/STEMI.  Signed, Charlie Pitter, PA-C 03/06/2019, 8:10 AM

## 2019-03-06 NOTE — Progress Notes (Signed)
[facility] Progress Note  Subjective: quiet night, no c/o this am.   Vitals:   03/06/19 1140 03/06/19 1211 03/06/19 1233 03/06/19 1300  BP: 99/60 (!) 97/54 (!) 97/53 (!) 99/56  Pulse: 71 70 64 71  Resp: 18 18    Temp:  97.7 F (36.5 C)    TempSrc:  Oral    SpO2: 98% 96%    Weight:  90 kg    Height:        Inpatient medications: . heparin      . allopurinol  100 mg Oral Daily  . amiodarone  200 mg Oral Daily  . bictegravir-emtricitabine-tenofovir AF  1 tablet Oral Daily  . brimonidine  1 drop Left Eye BID  . Chlorhexidine Gluconate Cloth  6 each Topical Q0600  . Chlorhexidine Gluconate Cloth  6 each Topical Q0600  . dorzolamide-timolol  1 drop Left Eye BID  . feeding supplement (NEPRO CARB STEADY)  237 mL Oral QHS  . feeding supplement (PRO-STAT SUGAR FREE 64)  30 mL Oral BID  . [START ON 03/07/2019] heparin  5,800 Units Dialysis Once in dialysis  . latanoprost  1 drop Left Eye QHS  . midodrine  10 mg Oral 2 times per day on Tue Thu Sat  . multivitamin  1 tablet Oral QHS  . pantoprazole  40 mg Oral QHS  . warfarin  5 mg Oral q1800  . Warfarin - Pharmacist Dosing Inpatient   Does not apply q1800    acetaminophen, albuterol, ondansetron (ZOFRAN) IV  Iron/TIBC/Ferritin/ %Sat    Component Value Date/Time   IRON 27 (L) 02/22/2018 0752   TIBC 218 (L) 02/22/2018 0752   FERRITIN 910 (H) 02/22/2018 0752   IRONPCTSAT 12 (L) 02/22/2018 0752   IRONPCTSAT 32 03/06/2013 1138    Exam: Gen on vent, alert  No rash, cyanosis or gangrene Sclera anicteric, throat w ETT  No jvd or bruits, flat neck veins Chest clear bilat no rales RRR no MRG Abd firm, mod distended, +bs Ext trace bilat LE edema Neuro is alert, Ox 3 , nf   Home meds:  - allopurinol 100/ auryxia tid ac  - warfarin as directed qd/ amiodarone 200 qd  - midodrine 10mg  pre HD TTS  - bictegravir-emtricitabine-tenofovir 1 qd    [place] T[facility]  4h  2/2 bath  84kg   400/800  P2  RUA AVF  Hep 5800 -  mircera 75 ug last on 7/02 - calictriol 0.5 tiw   Assessment: 1. Acute resp failure / SOB - presentation c/w pulm edema. CXR cleared after HD 3/31 w/ 3 L off. Wt's off today.  Pt extubated and dc'd to floor. Possible dc after HD today to home.  2. BP/ volume - under dry slightly, no antiHTN meds at home, on midodrine pre HD tiw  3. ESRD on HD TTS - plan for HD today, get pre standing wt 4. NICM EF 25% 5. HIV - on ART 6. Anemia - recent esa on 3/26, Hb good =12  7. Dispo - stable for d/c from renal standpoint after HD today  [facility] 03/06/2019, 1:28 PM  Recent Labs  Lab 03/04/19 0514  03/05/19 0152 03/06/19 0952  NA 140   < > 136 138  K 4.0   < > 4.4 4.5  CL 96*  --  93* 93*  CO2 19*  --  27 26  GLUCOSE 236*  --  153* 112*  BUN 72*  --  41* 88*  CREATININE 11.44*  --  8.18* 11.53*  CALCIUM 9.4  --  9.3 9.2  PHOS  --   --  4.4 5.8*  ALBUMIN 4.2  --   --   --   INR  --    < > 2.0* 1.8*   < > = values in this interval not displayed.   Recent Labs  Lab 03/04/19 0514  AST 30  ALT 28  ALKPHOS 116  BILITOT 0.8  PROT 8.3*   Recent Labs  Lab 03/04/19 0514  03/04/19 1144 03/05/19 0152  WBC 12.9*  --   --  8.6  NEUTROABS 7.1  --   --   --   HGB 13.8   < > 12.6* 12.0*  HCT 44.8   < > 37.0* 36.6*  MCV 113.1*  --   --  107.3*  PLT 294  --   --  217   < > = values in this interval not displayed.

## 2019-03-06 NOTE — Progress Notes (Signed)
Patients home meds, [name]  18 tabs.returned to patient.

## 2019-03-06 NOTE — Progress Notes (Signed)
Nutrition Follow-up  DOCUMENTATION CODES:   Not applicable  INTERVENTION:   -Continue renal MVI daily -Continue 30 ml Prostat BID, each supplement provides 100 kcals and 15 grams protein -Nepro Shake po q HS, each supplement provides 425 kcal and 19 grams protein  NUTRITION DIAGNOSIS:   Increased nutrient needs related to chronic illness(ESRD on HD) as evidenced by estimated needs.  Ongoing  GOAL:   Patient will meet greater than or equal to 90% of their needs  Progressing  MONITOR:   PO intake, Labs, Weight trends, Skin, I & O's  REASON FOR ASSESSMENT:   Ventilator    ASSESSMENT:   Patient with PMH significant for HIV, ESRD on HD, CHF, cardiac arrest (02/2108), GERD, s/p cardiac cath., HLD, HTN, and nonischemic cardiomyopathy. Presents this admission with acute exacerbation of CHF.   4/1- extubated, transferred from ICU to floor  Reviewed I/O's: +607 ml x 24 hours and -1.5 L since admission  UOP: 0 ml x 24 hours  Pt out of room at time of visit. Per RN, pt was just transported to HD suite.   Meal completion is fair, documented at 50%. Pt is compliant with renal MVI and Prostat supplements. Will add Nepro shake q HS to assist pt in meeting nutritional needs. Pt still has not had BM.   Labs reviewed: Phos: 5.8, CBGS: 99-127  Diet Order:   Diet Order            Diet 2 gram sodium Room service appropriate? Yes; Fluid consistency: Thin  Diet effective now              EDUCATION NEEDS:   Education needs have been addressed  Skin:  Skin Assessment: Reviewed RN Assessment  Last BM:  Unknown  Height:   Ht Readings from Last 1 Encounters:  03/04/19 6\' 2"  (1.88 m)    Weight:   Wt Readings from Last 1 Encounters:  03/06/19 82.4 kg    Ideal Body Weight:  86.4 kg  BMI:  Body mass index is 23.32 kg/m.  Estimated Nutritional Needs:   Kcal:  2000-2200  Protein:  105-120 grams  Fluid:  1000 ml + UOP    Rawan Riendeau A. Jimmye Norman, RD, LDN,  [place] Registered Dietitian II Certified Diabetes Care and Education Specialist Pager: [phone] After hours Pager: [phone]

## 2019-03-06 NOTE — Discharge Summary (Signed)
Physician Discharge Summary   Patient ID: David [name] MRN: [id number] DOB/AGE: 1941/05/23 78 y.o.  Admit date: 03/04/2019 Discharge date: 03/06/2019                     Discharge Plan by Diagnosis   Acute on chronic combined heart failure, NICM, PAF. - Continue home amiodarone, warfarin. - Follow up with cardiology as an outpatient. - Per cardiology, not a candidate for CRT, LVAD, or transplant due to comorbid conditions, and high risk of complications with advanced therapies.  ESRD on HD TTS (with hx of non-compliance as an outpatient). - F/u with nephrology for routine HD. - Importance of maintaining / following schedule emphasized. - Continue home midodrine.  Hx HIV. - Continue home biktarvy. - F/u with ID as an outpatient.   Discharge Summary   David [name] is a 78 y.o. y/o male with a PMH ofHIV (CD4 410 and VL undetectable in January), paroxysmal atrial fibrillation on warfarin,  chronic systolic CHF with an EF of 15-20% per EF 2019, and end-stage renal disease on hemodialysis, who presented to the emergency department 3/31 for evaluation of shortness of breath that started acutely around 2 am that morning.  He arrived in resp distress, was placed on BIPAP but vomited and had to be intubated. He presented 2 weeks ago with similar presentation and was felt to have pulmonary edema.  During that admission, he received HD with 2L of volume removal and improved.  He was once again found to have acute pulmonary edema / volume overload.  Nephrology was consulted for HD and volume removal.  He received HD on 3/31 with 3L fluid removed.  He was successfully liberated from the ventilator on 4/1.  He had another HD session 4/2 and following this, he was deemed medically stable and was cleared for discharge home.         Significant Hospital Events   3/31 > admit, intubated. 3/31 > HD. 4/1 > extubated. 4/2 > HD.  Later discharged.  Significant Diagnostic Studies  CXR  3/31 > CHF.  Micro Data  RVP 3/31 > neg.  Antimicrobials  None.  Consults: date of consult/date signed off & final recs:  Cardiology.  Procedures (surgical and bedside):  ETT 3/31 > 4/1.   Objective:  Blood pressure (!) 103/50, pulse 75, temperature 98.2 F (36.8 C), temperature source Oral, resp. rate 20, height 6' 2"  (1.88 m), weight 79.8 kg, SpO2 100 %.        Intake/Output Summary (Last 24 hours) at 03/06/2019 1914 Last data filed at 03/06/2019 1635 Gross per 24 hour  Intake 800 ml  Output 2402 ml  Net -1602 ml   Filed Weights   03/06/19 0542 03/06/19 1211 03/06/19 1635  Weight: 82.4 kg 90 kg 79.8 kg    Physical Examination: General: Adult male, resting in bed, in NAD. Neuro: A&O x 3, non-focal.  HEENT: [place]/AT. EOMI, sclerae anicteric. Cardiovascular: RRR, no M/R/G.  Lungs: Respirations even and unlabored.  CTA bilaterally, No W/R/R.  Abdomen: BS x 4, soft, NT/ND.  Musculoskeletal: No gross deformities, no edema.  Skin: Intact, warm, no rashes.   Discharge Labs:  BMET Recent Labs  Lab 03/04/19 0514 03/04/19 0841 03/04/19 1144 03/05/19 0152 03/06/19 0952  NA 140 138 138 136 138  K 4.0 4.3 4.5 4.4 4.5  CL 96*  --   --  93* 93*  CO2 19*  --   --  27 26  GLUCOSE 236*  --   --  153* 112*  BUN 72*  --   --  41* 88*  CREATININE 11.44*  --   --  8.18* 11.53*  CALCIUM 9.4  --   --  9.3 9.2  MG  --   --   --  2.3 2.4  PHOS  --   --   --  4.4 5.8*    CBC Recent Labs  Lab 03/04/19 0514 03/04/19 0841 03/04/19 1144 03/05/19 0152  HGB 13.8 13.9 12.6* 12.0*  HCT 44.8 41.0 37.0* 36.6*  WBC 12.9*  --   --  8.6  PLT 294  --   --  217    Anti-Coagulation Recent Labs  Lab 03/04/19 0924 03/05/19 0152 03/06/19 0952  INR 2.0* 2.0* 1.8*    Discharge Instructions    Diet - low sodium heart healthy   Complete by:  As directed    Increase activity slowly   Complete by:  As directed       Allergies as of 03/06/2019      Reactions   Cozaar [losartan  Potassium] Other (See Comments)   Causes constipation      Medication List    TAKE these medications   acetaminophen 500 MG tablet Commonly known as:  TYLENOL Take 1,000 mg by mouth every 6 (six) hours as needed for mild pain.   allopurinol 100 MG tablet Commonly known as:  ZYLOPRIM TAKE 1 TABLET EVERY DAY   amiodarone 200 MG tablet Commonly known as:  PACERONE TAKE 1 TABLET(200 MG) BY MOUTH DAILY What changed:  See the new instructions.   Auryxia 1 GM 210 MG(Fe) tablet Generic drug:  ferric citrate Take 420 mg by mouth 3 (three) times daily with meals.   bictegravir-emtricitabine-tenofovir AF 50-200-25 MG Tabs tablet Commonly known as:  [name] Take 1 tablet by mouth daily.   brimonidine 0.15 % ophthalmic solution Commonly known as:  ALPHAGAN Place 1 drop into the left eye 2 (two) times daily.   dorzolamide-timolol 22.3-6.8 MG/ML ophthalmic solution Commonly known as:  COSOPT Place 1 drop into the left eye 2 (two) times daily.   latanoprost 0.005 % ophthalmic solution Commonly known as:  XALATAN Place 1 drop into the left eye at bedtime.   loratadine 10 MG tablet Commonly known as:  CLARITIN Take 1 tablet (10 mg total) by mouth daily.   midodrine 10 MG tablet Commonly known as:  PROAMATINE Take 1 tablet (10 mg total) by mouth as directed. Twice a day on the day of HD Tuesday, Thursday, Saturday (1 in AM and 1 at [place]). Take 1/2 tablet (71m) a day on non HD days What changed:  additional instructions   multivitamin Tabs tablet Take 1 tablet by mouth daily.   Vitamin D (Ergocalciferol) 1.25 MG (50000 UT) Caps capsule Commonly known as:  DRISDOL Take 50,000 Units by mouth every Friday.   warfarin 5 MG tablet Commonly known as:  COUMADIN Take as directed. If you are unsure how to take this medication, talk to your nurse or doctor. Original instructions:  Take 2 tablets by mouth daily or as directed by coumadin clinic What changed:    how much to  take  how to take this  when to take this  additional instructions        Disposition: Home.   [facility]  [facility] and is medically stable and cleared for discharge.  Patient is pending follow up as above.    Time spent  on discharge: Greater than 35 minutes.    Montey Hora, [place] [facility] Pager: [phone].  If no answer, [phone] 03/06/2019, 7:14 PM

## 2019-03-06 NOTE — Progress Notes (Signed)
Received post HD TX. Alert and oriented, not in distress. Pt. verbalized wanting to go home.

## 2019-03-06 NOTE — Progress Notes (Signed)
Physical Therapy Treatment Patient Details Name: David [name] MRN: [id number] DOB: 02-17-1941 Today's Date: 03/06/2019    History of Present Illness Pt is a 78 y.o. M with significant PMH of HIV, paroxysmal atrial fibrillation, end stage renal disease on hemodialysis, chronic systolic CHF with EF 00-17%, who presents with shortness of breath. Intubated 3/31-4/1    PT Comments    Pt admitted with above diagnosis. Pt currently with functional limitations due to balance and endurance deficits. Pt was able to ambulate in hallway with RW.  Occasional cues for safety at times.  Has a rW at home he can use.   Pt will benefit from skilled PT to increase their independence and safety with mobility to allow discharge to the venue listed below.    Follow Up Recommendations  Home health PT     Equipment Recommendations  None recommended by PT    Recommendations for Other Services       Precautions / Restrictions Precautions Precautions: Fall Restrictions Weight Bearing Restrictions: No    Mobility  Bed Mobility Overal bed mobility: Modified Independent                Transfers Overall transfer level: Needs assistance Equipment used: None Transfers: Sit to/from Stand Sit to Stand: Min assist         General transfer comment: light min assist to stand  Ambulation/Gait Ambulation/Gait assistance: Min guard Gait Distance (Feet): 350 Feet Assistive device: Rolling walker (2 wheeled) Gait Pattern/deviations: Step-through pattern;Decreased stride length Gait velocity: decr   General Gait Details: Pt requiring min guard assist. Did need cues to stay close to rW and to steer around obstacles at times.     Stairs             Wheelchair Mobility    Modified Rankin (Stroke Patients Only)       Balance Overall balance assessment: Needs assistance Sitting-balance support: Feet supported Sitting balance-Leahy Scale: Good     Standing balance support:  During functional activity;Bilateral upper extremity supported Standing balance-Leahy Scale: Fair Standing balance comment: can stand statically without UE support.                             Cognition Arousal/Alertness: Awake/alert Behavior During Therapy: Flat affect Overall Cognitive Status: Within Functional Limits for tasks assessed                                        Exercises General Exercises - Lower Extremity Long Arc Quad: 10 reps;Both;Seated    General Comments        Pertinent Vitals/Pain Pain Assessment: No/denies pain    Home Living                      Prior Function            PT Goals (current goals can now be found in the care plan section) Acute Rehab PT Goals Patient Stated Goal: "go home tomorrow." Progress towards PT goals: Progressing toward goals    Frequency    Min 3X/week      PT Plan Current plan remains appropriate    Co-evaluation              AM-PAC PT "6 Clicks" Mobility   Outcome Measure  Help needed turning from your back to your side while  in a flat bed without using bedrails?: None Help needed moving from lying on your back to sitting on the side of a flat bed without using bedrails?: None Help needed moving to and from a bed to a chair (including a wheelchair)?: A Little Help needed standing up from a chair using your arms (e.g., wheelchair or bedside chair)?: A Little Help needed to walk in hospital room?: A Little Help needed climbing 3-5 steps with a railing? : A Little 6 Click Score: 20    End of Session Equipment Utilized During Treatment: Gait belt Activity Tolerance: Patient tolerated treatment well Patient left: with call bell/phone within reach;in bed;with bed alarm set Nurse Communication: Mobility status PT Visit Diagnosis: Unsteadiness on feet (R26.81);Difficulty in walking, not elsewhere classified (R26.2)     Time: 8257-4935 PT Time Calculation (min)  (ACUTE ONLY): 17 min  Charges:  $Gait Training: 8-22 mins                     [place] Pager:  [phone]  Office:  [place] 03/06/2019, 12:06 PM

## 2019-03-08 ENCOUNTER — Inpatient Hospital Stay (HOSPITAL_COMMUNITY)
Admission: EM | Admit: 2019-03-08 | Discharge: 2019-03-18 | DRG: 393 | Disposition: A | Discharge status: Home Health | Payer: Medicare HMO | Attending: Internal Medicine | Admitting: Internal Medicine

## 2019-03-08 ENCOUNTER — Other Ambulatory Visit: Payer: Self-pay

## 2019-03-08 ENCOUNTER — Encounter (HOSPITAL_COMMUNITY): Payer: Self-pay | Admitting: Oncology

## 2019-03-08 DIAGNOSIS — K529 Noninfective gastroenteritis and colitis, unspecified: Secondary | ICD-10-CM | POA: Diagnosis not present

## 2019-03-08 DIAGNOSIS — I5042 Chronic combined systolic (congestive) and diastolic (congestive) heart failure: Secondary | ICD-10-CM | POA: Diagnosis present

## 2019-03-08 DIAGNOSIS — Z8673 Personal history of transient ischemic attack (TIA), and cerebral infarction without residual deficits: Secondary | ICD-10-CM | POA: Diagnosis not present

## 2019-03-08 DIAGNOSIS — Z21 Asymptomatic human immunodeficiency virus [HIV] infection status: Secondary | ICD-10-CM | POA: Diagnosis present

## 2019-03-08 DIAGNOSIS — J810 Acute pulmonary edema: Secondary | ICD-10-CM | POA: Diagnosis not present

## 2019-03-08 DIAGNOSIS — R109 Unspecified abdominal pain: Secondary | ICD-10-CM | POA: Diagnosis not present

## 2019-03-08 DIAGNOSIS — I9589 Other hypotension: Secondary | ICD-10-CM | POA: Diagnosis present

## 2019-03-08 DIAGNOSIS — K219 Gastro-esophageal reflux disease without esophagitis: Secondary | ICD-10-CM | POA: Diagnosis present

## 2019-03-08 DIAGNOSIS — R791 Abnormal coagulation profile: Secondary | ICD-10-CM | POA: Diagnosis present

## 2019-03-08 DIAGNOSIS — E278 Other specified disorders of adrenal gland: Secondary | ICD-10-CM

## 2019-03-08 DIAGNOSIS — N2581 Secondary hyperparathyroidism of renal origin: Secondary | ICD-10-CM | POA: Diagnosis not present

## 2019-03-08 DIAGNOSIS — Z79899 Other long term (current) drug therapy: Secondary | ICD-10-CM | POA: Diagnosis not present

## 2019-03-08 DIAGNOSIS — I482 Chronic atrial fibrillation, unspecified: Secondary | ICD-10-CM | POA: Diagnosis present

## 2019-03-08 DIAGNOSIS — R1031 Right lower quadrant pain: Secondary | ICD-10-CM | POA: Diagnosis present

## 2019-03-08 DIAGNOSIS — I43 Cardiomyopathy in diseases classified elsewhere: Secondary | ICD-10-CM | POA: Diagnosis present

## 2019-03-08 DIAGNOSIS — R1032 Left lower quadrant pain: Secondary | ICD-10-CM | POA: Diagnosis not present

## 2019-03-08 DIAGNOSIS — K559 Vascular disorder of intestine, unspecified: Secondary | ICD-10-CM | POA: Diagnosis not present

## 2019-03-08 DIAGNOSIS — R935 Abnormal findings on diagnostic imaging of other abdominal regions, including retroperitoneum: Secondary | ICD-10-CM

## 2019-03-08 DIAGNOSIS — N281 Cyst of kidney, acquired: Secondary | ICD-10-CM | POA: Diagnosis not present

## 2019-03-08 DIAGNOSIS — Z992 Dependence on renal dialysis: Secondary | ICD-10-CM

## 2019-03-08 DIAGNOSIS — I420 Dilated cardiomyopathy: Secondary | ICD-10-CM | POA: Diagnosis present

## 2019-03-08 DIAGNOSIS — D539 Nutritional anemia, unspecified: Secondary | ICD-10-CM | POA: Diagnosis present

## 2019-03-08 DIAGNOSIS — C61 Malignant neoplasm of prostate: Secondary | ICD-10-CM | POA: Diagnosis present

## 2019-03-08 DIAGNOSIS — R52 Pain, unspecified: Secondary | ICD-10-CM | POA: Diagnosis not present

## 2019-03-08 DIAGNOSIS — I132 Hypertensive heart and chronic kidney disease with heart failure and with stage 5 chronic kidney disease, or end stage renal disease: Secondary | ICD-10-CM | POA: Diagnosis present

## 2019-03-08 DIAGNOSIS — R1084 Generalized abdominal pain: Secondary | ICD-10-CM | POA: Diagnosis not present

## 2019-03-08 DIAGNOSIS — Z8546 Personal history of malignant neoplasm of prostate: Secondary | ICD-10-CM

## 2019-03-08 DIAGNOSIS — K55049 Acute infarction of large intestine, extent unspecified: Secondary | ICD-10-CM | POA: Diagnosis not present

## 2019-03-08 DIAGNOSIS — Z7901 Long term (current) use of anticoagulants: Secondary | ICD-10-CM

## 2019-03-08 DIAGNOSIS — D631 Anemia in chronic kidney disease: Secondary | ICD-10-CM | POA: Diagnosis not present

## 2019-03-08 DIAGNOSIS — E8889 Other specified metabolic disorders: Secondary | ICD-10-CM | POA: Diagnosis present

## 2019-03-08 DIAGNOSIS — Z515 Encounter for palliative care: Secondary | ICD-10-CM | POA: Diagnosis present

## 2019-03-08 DIAGNOSIS — K55039 Acute (reversible) ischemia of large intestine, extent unspecified: Principal | ICD-10-CM | POA: Diagnosis present

## 2019-03-08 DIAGNOSIS — Z87891 Personal history of nicotine dependence: Secondary | ICD-10-CM | POA: Diagnosis not present

## 2019-03-08 DIAGNOSIS — N186 End stage renal disease: Secondary | ICD-10-CM

## 2019-03-08 DIAGNOSIS — K5900 Constipation, unspecified: Secondary | ICD-10-CM | POA: Diagnosis present

## 2019-03-08 DIAGNOSIS — K633 Ulcer of intestine: Secondary | ICD-10-CM

## 2019-03-08 DIAGNOSIS — I120 Hypertensive chronic kidney disease with stage 5 chronic kidney disease or end stage renal disease: Secondary | ICD-10-CM | POA: Diagnosis not present

## 2019-03-08 DIAGNOSIS — I2510 Atherosclerotic heart disease of native coronary artery without angina pectoris: Secondary | ICD-10-CM | POA: Diagnosis present

## 2019-03-08 DIAGNOSIS — I480 Paroxysmal atrial fibrillation: Secondary | ICD-10-CM | POA: Diagnosis present

## 2019-03-08 DIAGNOSIS — Z833 Family history of diabetes mellitus: Secondary | ICD-10-CM | POA: Diagnosis not present

## 2019-03-08 DIAGNOSIS — E785 Hyperlipidemia, unspecified: Secondary | ICD-10-CM | POA: Diagnosis present

## 2019-03-08 DIAGNOSIS — B20 Human immunodeficiency virus [HIV] disease: Secondary | ICD-10-CM | POA: Diagnosis present

## 2019-03-08 DIAGNOSIS — R933 Abnormal findings on diagnostic imaging of other parts of digestive tract: Secondary | ICD-10-CM | POA: Diagnosis not present

## 2019-03-08 DIAGNOSIS — Z9889 Other specified postprocedural states: Secondary | ICD-10-CM | POA: Diagnosis not present

## 2019-03-08 DIAGNOSIS — Q53211 Bilateral intraabdominal testes: Secondary | ICD-10-CM

## 2019-03-08 DIAGNOSIS — K4090 Unilateral inguinal hernia, without obstruction or gangrene, not specified as recurrent: Secondary | ICD-10-CM | POA: Diagnosis not present

## 2019-03-08 LAB — CBC WITH DIFFERENTIAL/PLATELET
Abs Immature Granulocytes: 0.05 10*3/uL (ref 0.00–0.07)
Basophils Absolute: 0 10*3/uL (ref 0.0–0.1)
Basophils Relative: 0 %
Eosinophils Absolute: 0 10*3/uL (ref 0.0–0.5)
Eosinophils Relative: 0 %
HCT: 42.5 % (ref 39.0–52.0)
Hemoglobin: 13.9 g/dL (ref 13.0–17.0)
Immature Granulocytes: 0 %
Lymphocytes Relative: 12 %
Lymphs Abs: 1.4 10*3/uL (ref 0.7–4.0)
MCH: 36.1 pg — ABNORMAL HIGH (ref 26.0–34.0)
MCHC: 32.7 g/dL (ref 30.0–36.0)
MCV: 110.4 fL — ABNORMAL HIGH (ref 80.0–100.0)
Monocytes Absolute: 1.1 10*3/uL — ABNORMAL HIGH (ref 0.1–1.0)
Monocytes Relative: 10 %
Neutro Abs: 9.1 10*3/uL — ABNORMAL HIGH (ref 1.7–7.7)
Neutrophils Relative %: 78 %
Platelets: 205 10*3/uL (ref 150–400)
RBC: 3.85 MIL/uL — ABNORMAL LOW (ref 4.22–5.81)
RDW: 15 % (ref 11.5–15.5)
WBC: 11.7 10*3/uL — ABNORMAL HIGH (ref 4.0–10.5)
nRBC: 0 % (ref 0.0–0.2)

## 2019-03-08 LAB — PROTIME-INR
INR: 1.9 — ABNORMAL HIGH (ref 0.8–1.2)
Prothrombin Time: 21.4 seconds — ABNORMAL HIGH (ref 11.4–15.2)

## 2019-03-08 LAB — COMPREHENSIVE METABOLIC PANEL
ALT: 26 U/L (ref 0–44)
AST: 30 U/L (ref 15–41)
Albumin: 4.2 g/dL (ref 3.5–5.0)
Alkaline Phosphatase: 93 U/L (ref 38–126)
Anion gap: 20 — ABNORMAL HIGH (ref 5–15)
BUN: 33 mg/dL — ABNORMAL HIGH (ref 8–23)
CO2: 27 mmol/L (ref 22–32)
Calcium: 9.7 mg/dL (ref 8.9–10.3)
Chloride: 89 mmol/L — ABNORMAL LOW (ref 98–111)
Creatinine, Ser: 6.8 mg/dL — ABNORMAL HIGH (ref 0.61–1.24)
GFR calc Af Amer: 8 mL/min — ABNORMAL LOW (ref >60)
GFR calc non Af Amer: 7 mL/min — ABNORMAL LOW (ref >60)
Glucose, Bld: 134 mg/dL — ABNORMAL HIGH (ref 70–99)
Potassium: 4.4 mmol/L (ref 3.5–5.1)
Sodium: 136 mmol/L (ref 135–145)
Total Bilirubin: 0.7 mg/dL (ref 0.3–1.2)
Total Protein: 8.6 g/dL — ABNORMAL HIGH (ref 6.5–8.1)

## 2019-03-08 LAB — LIPASE, BLOOD: Lipase: 55 U/L — ABNORMAL HIGH (ref 11–51)

## 2019-03-08 MED ORDER — HYDROMORPHONE HCL 1 MG/ML IJ SOLN
0.5000 mg | Freq: Once | INTRAMUSCULAR | Status: AC
  Administered 2019-03-09: 0.5 mg via INTRAVENOUS
  Filled 2019-03-08: qty 1

## 2019-03-08 MED ORDER — MORPHINE SULFATE (PF) 4 MG/ML IV SOLN
4.0000 mg | Freq: Once | INTRAVENOUS | Status: AC
  Administered 2019-03-08: 23:00:00 4 mg via INTRAVENOUS
  Filled 2019-03-08: qty 1

## 2019-03-08 MED ORDER — ONDANSETRON HCL 4 MG/2ML IJ SOLN
4.0000 mg | Freq: Once | INTRAMUSCULAR | Status: AC
  Administered 2019-03-08: 4 mg via INTRAVENOUS
  Filled 2019-03-08: qty 2

## 2019-03-08 NOTE — ED Notes (Signed)
Nurse starting IV and will collect blue top

## 2019-03-08 NOTE — ED Triage Notes (Signed)
Pt bib PTAR from home d/t RLQ pain that started at noon today.  Pt describes the pain as pressure and rates it 9/10.  +Constipation. Denies any other sx.

## 2019-03-08 NOTE — ED Provider Notes (Signed)
[facility] EMERGENCY DEPARTMENT Provider Note   CSN: [id number] Arrival date & time: 03/08/19  2201    History   Chief Complaint Chief Complaint  Patient presents with  . Abdominal Pain    HPI Ma[place] Rock is a 78 y.o. male.  He is presenting today by EMS for evaluation of right lower quadrant abdominal pain.  He is end-stage renal disease Tuesday Thursday Saturday and had dialysis today.  He said he had a steady after for a few hours because his blood pressure was so low.  When he got home he started with some right lower quadrant pain.  He rates the pain is 9 out of 10.  He said he moved his bowels 3 times today because he had been constipated recently due to his last hospitalization.  He was admitted on the 31st for CHF/shortness of breath and was needed to be on BiPAP.  He ended up improving after dialysis.     The history is provided by the patient.  Abdominal Pain  Pain location:  RLQ Pain quality: aching   Pain radiates to:  Does not radiate Pain severity:  Severe Onset quality:  Sudden Timing:  Constant Progression:  Unchanged Chronicity:  New Context: not recent travel, not sick contacts, not suspicious food intake and not trauma   Relieved by:  None tried Worsened by:  Nothing Ineffective treatments:  Bowel activity Associated symptoms: constipation   Associated symptoms: no chest pain, no cough, no dysuria, no fever, no hematuria, no nausea, no shortness of breath, no sore throat and no vomiting     Past Medical History:  Diagnosis Date  . Acute on chronic systolic and diastolic heart failure, NYHA class 4 ([place])   . Anemia, iron deficiency 11/15/2011  . Arthritis    "hands, right knee, feet" (02/21/2018)  . Atrial fibrillation ([place])   . Cancer [facility])    hx of prostate; s/p radioactive seed implant 10/2009 Dr Janice Norrie  . Cardiac arrest ([place]) 02/17/2018  . CHF (congestive heart failure) ([place])   . Chronic lower back pain   . CKD (chronic  kidney disease) stage V requiring chronic dialysis ([place])   . ESRD on dialysis ([place])    started 02/2018  . GERD (gastroesophageal reflux disease)   . Glaucoma   . Gout    daily RX (02/21/2018)  . Heart murmur    "mild" per pt  . Hepatitis    years ago  . History of cardiac cath 2004   negative for CAD  . History of myocardial perfusion scan 02/2010   negative for coronary insufficiency (LVEF 27%)  . HIV (human immunodeficiency virus infection) ([place])   . HIV infection ([place])   . Hyperlipidemia   . Hypertension    followed by [facility] and Vascular (Dr Dani Gobble Croitoru)  . Hypertension   . Nonischemic cardiomyopathy ([place])   . Pneumonia 11/2017  . Prostate cancer ([place])   . Sinus bradycardia   . Sleep apnea    does not use a cpap  . Stroke [facility])    "mini stroke" years ago  . Systolic and diastolic CHF, acute ([place]) 06/2010   felt to be secondary to hypertensive cardiomyopathy    Patient Active Problem List   Diagnosis Date Noted  . Acute respiratory failure with hypoxia and hypercapnia ([place]) 03/04/2019  . Acute pulmonary edema ([place]) 02/11/2019  . Paroxysmal atrial fibrillation ([place]) 11/16/2018  . LBBB (left bundle branch block) 11/16/2018  . Encounter for monitoring  amiodarone therapy 11/16/2018  . Volume overload 11/01/2018  . Pulmonary edema 10/21/2018  . Gout 10/21/2018  . Hypertensive emergency 10/21/2018  . Chronic combined systolic and diastolic heart failure ([place]) 10/21/2018  . Leukocytosis 10/21/2018  . Gastroesophageal reflux disease 08/28/2018  . Chronic anticoagulation 04/03/2018  . CAD (coronary artery disease) 04/03/2018  . Encounter for therapeutic drug monitoring 03/27/2018  . Atrial fibrillation, chronic 03/25/2018  . Palliative care by specialist   . Advance care planning   . Goals of care, counseling/discussion   . Cardiac arrest ([place]) 02/17/2018  . Respiratory arrest ([place])   . Acute on chronic systolic heart failure ([place])   . Chronic obstructive  pulmonary disease ([place])   . HIV (human immunodeficiency virus infection) ([place])   . ESRD (end stage renal disease) ([place])   . Acute respiratory failure with hypoxia ([place]) 01/21/2018  . Pneumonia   . Aspiration pneumonia of both lower lobes due to regurgitated food ([place])   . Sepsis due to pneumonia ([place]) 11/22/2017  . AIDS ([place]) 05/14/2014  . 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 Gastroparesis 05/06/2014  . Protein-calorie malnutrition, severe ([place]) 05/05/2014  . ESRD on hemodialysis ([place]) 05/02/2014  . Hypotension 05/02/2014  . Loss of weight 04/29/2014  . Conjunctivitis 04/29/2014  . 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 Low back pain 04/09/2012  . Osteoarthritis of hip 12/29/2011  . Iron deficiency anemia 06/28/2011  . Macrocytic anemia 04/02/2011  . 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 Hyperlipidemia 11/30/2010  . Transient cerebral ischemia 11/30/2010  . HYPERGLYCEMIA 11/30/2010    Past Surgical History:  Procedure Laterality Date  . AV FISTULA PLACEMENT Right 10/13/2016   Procedure: ARTERIOVENOUS (AV) FISTULA CREATION;  Surgeon: [name], MD;  Location: [place];  Service: Vascular;  Laterality: Right;  . AV FISTULA PLACEMENT Right 10/13/2016   [name] [id number]  . AV FISTULA PLACEMENT Left 02/25/2018   Procedure: INSERTION OF ARTERIOVENOUS (AV) GORE-TEX GRAFT LEFT UPPER ARM;  Surgeon: Angelia Mould, MD;  Location: [place];  Service: Vascular;  Laterality: Left;  . AV FISTULA PLACEMENT Right 08/07/2018   Procedure: Creation of right arm brachiocephalic Fistula;  Surgeon: Waynetta Sandy, MD;  Location: [place];  Service: Vascular;  Laterality: Right;  . BASCILIC VEIN TRANSPOSITION Left 06/24/2015   Procedure: BASILIC VEIN TRANSPOSITION;  Surgeon: Mal Misty, MD;  Location: [place];  Service: Vascular;  Laterality: Left;  . BASCILIC VEIN TRANSPOSITION Left 2/63/7858 Bascilic vein transposition (Left)   [name] [phone]  . CARDIAC  CATHETERIZATION  02/04/2003   mildly depressed LV systolic fx EF 87%,OMVEHM coronaries/abdominal aorta/renal arteries.  Marland Kitchen CARDIAC CATHETERIZATION  2004  . CATARACT EXTRACTION, BILATERAL Bilateral   . COLONOSCOPY    . ESOPHAGOGASTRODUODENOSCOPY (EGD) WITH PROPOFOL N/A 05/05/2014   Procedure: ESOPHAGOGASTRODUODENOSCOPY (EGD) WITH PROPOFOL;  Surgeon: Missy Sabins, MD;  Location: [facility];  Service: Endoscopy;  Laterality: N/A;  . EYE SURGERY Bilateral    cataract surgery   . EYE SURGERY Bilateral    glaucoma surgery  . EYE SURGERY    . GLAUCOMA SURGERY Bilateral   . INSERTION OF ARTERIOVENOUS (AV) ARTEGRAFT ARM  10/09/2018   Procedure: INSERTION OF ARTERIOVENOUS (AV) 28mm x 41cm ARTEGRAFT LEFT UPPER ARM;  Surgeon: Waynetta Sandy, MD;  Location: [place];  Service: Vascular;;  . INSERTION PROSTATE RADIATION SEED    . IR FLUORO GUIDE CV LINE RIGHT  02/18/2018  . IR REMOVAL TUN CV CATH W/O FL  12/06/2018  . IR US  GUIDE VASC ACCESS RIGHT  02/18/2018  . LEFT HEART CATH AND CORONARY ANGIOGRAPHY N/A 02/20/2018   Procedure: LEFT HEART CATH AND CORONARY ANGIOGRAPHY;  Surgeon: Jettie Booze, MD;  Location: [place] CV LAB;  Service: Cardiovascular;  Laterality: N/A;  . NM MYOCAR PERF WALL MOTION  02/21/2010   normal  . RADIOACTIVE SEED IMPLANT  2010   prostate cancer  . THROMBECTOMY W/ EMBOLECTOMY Left 02/26/2018   Procedure: THROMBECTOMY ARTERIOVENOUS GRAFT;  Surgeon: Angelia Mould, MD;  Location: [place];  Service: Vascular;  Laterality: Left;  . ULTRASOUND GUIDANCE FOR VASCULAR ACCESS  02/20/2018   Procedure: Ultrasound Guidance For Vascular Access;  Surgeon: Jettie Booze, MD;  Location: [place] CV LAB;  Service: Cardiovascular;;  . UPPER EXTREMITY VENOGRAPHY N/A 07/08/2018   Procedure: UPPER EXTREMITY VENOGRAPHY;  Surgeon: Waynetta Sandy, MD;  Location: [place] CV LAB;  Service: Cardiovascular;  Laterality: N/A;  Bilateral  . US ECHOCARDIOGRAPHY  02/06/2012    mild LVH,LA mod. dilated,mild-mod. 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 AI.        Home Medications    Prior to Admission medications   Medication Sig Start Date End Date Taking? Authorizing Provider  acetaminophen (TYLENOL) 500 MG tablet Take 1,000 mg by mouth every 6 (six) hours as needed for mild pain.    [provider]  allopurinol (ZYLOPRIM) 100 MG tablet TAKE 1 TABLET EVERY DAY Patient taking differently: Take 100 mg by mouth daily.  12/23/18   Croitoru, Mihai, MD  amiodarone (PACERONE) 200 MG tablet TAKE 1 TABLET(200 MG) BY MOUTH DAILY Patient taking differently: Take 200 mg by mouth daily.  10/29/18   Croitoru, Mihai, MD  AURYXIA 1 GM 210 MG(Fe) tablet Take 420 mg by mouth 3 (three) times daily with meals.  08/05/18   [provider]  bictegravir-emtricitabine-tenofovir AF ([name]) 50-200-25 MG TABS tablet Take 1 tablet by mouth daily. 01/01/19   Truman Hayward, MD  brimonidine (ALPHAGAN) 0.15 % ophthalmic solution Place 1 drop into the left eye 2 (two) times daily. 11/15/17   [provider]  dorzolamide-timolol (COSOPT) 22.3-6.8 MG/ML ophthalmic solution Place 1 drop into the left eye 2 (two) times daily. 12/17/17   [provider]  latanoprost (XALATAN) 0.005 % ophthalmic solution Place 1 drop into the left eye at bedtime.     [provider]  loratadine (CLARITIN) 10 MG tablet Take 1 tablet (10 mg total) by mouth daily. 02/21/19   [name], NP  midodrine (PROAMATINE) 10 MG tablet Take 1 tablet (10 mg total) by mouth as directed. Twice a day on the day of HD Tuesday, Thursday, Saturday (1 in AM and 1 at [place]). Take 1/2 tablet (5mg ) a day on non HD days 03/06/19   Shearon Stalls, Rahul P, PA-C  multivitamin (RENA-VIT) TABS tablet Take 1 tablet by mouth daily.    [provider]  Vitamin D, Ergocalciferol, (DRISDOL) 50000 UNITS CAPS capsule Take 50,000 Units by mouth every Friday.  04/10/15    [provider]  warfarin (COUMADIN) 5 MG tablet Take 2 tablets by mouth daily or as directed by coumadin clinic Patient taking differently: Take 10 mg by mouth daily.  01/30/19   Croitoru, Dani Gobble, MD    Family History Family History  Problem Relation Age of Onset  . Hypertension Mother   . Thyroid disease Mother   . Cholelithiasis Daughter   . Cholelithiasis Son   . Hypertension Maternal Grandmother   .  Diabetes Maternal Grandmother   . Heart attack Neg Hx   . Hyperlipidemia Neg Hx     Social History Social History   Tobacco Use  . Smoking status: Former Smoker    Packs/day: 1.00    Years: 30.00    Pack years: 30.00    Types: Cigarettes    Last attempt to quit: 2006    Years since quitting: 14.2  . Smokeless tobacco: Never Used  Substance Use Topics  . Alcohol use: Yes    Comment: once a week-wine  . Drug use: Never     Allergies   Cozaar [losartan potassium]   Review of Systems Review of Systems  Constitutional: Negative for fever.  HENT: Negative for sore throat.   Eyes: Negative for visual disturbance.  Respiratory: Negative for cough and shortness of breath.   Cardiovascular: Negative for chest pain.  Gastrointestinal: Positive for abdominal pain and constipation. Negative for nausea and vomiting.  Genitourinary: Negative for dysuria and hematuria.  Musculoskeletal: Negative for neck pain.  Skin: Negative for rash.  Neurological: Negative for headaches.     Physical Exam Updated Vital Signs BP 138/70 (BP Location: Left Arm)   Pulse 81   Temp 98 F (36.7 C) (Oral)   Resp (!) 21   Ht 6\' 2"  (1.88 m)   Wt 80.7 kg   SpO2 99%   BMI 22.85 kg/m   Physical Exam Vitals signs and nursing note reviewed.  Constitutional:      Appearance: He is well-developed.  HENT:     Head: Normocephalic and atraumatic.  Eyes:     Conjunctiva/sclera: Conjunctivae normal.  Neck:     Musculoskeletal: Neck supple.  Cardiovascular:     Rate and Rhythm:  Normal rate and regular rhythm.     Heart sounds: No murmur.  Pulmonary:     Effort: Pulmonary effort is normal. No respiratory distress.     Breath sounds: Normal breath sounds.  Abdominal:     Palpations: Abdomen is soft.     Tenderness: There is abdominal tenderness in the right lower quadrant. There is rebound. There is no guarding.  Musculoskeletal: Normal range of motion.     Comments: Fistula right upper arm with positive thrill.  Skin:    General: Skin is warm and dry.     Capillary Refill: Capillary refill takes less than 2 seconds.  Neurological:     General: No focal deficit present.     Mental Status: He is alert.      ED Treatments / Results  Labs (all labs ordered are listed, but only abnormal results are displayed) Labs Reviewed  CBC WITH DIFFERENTIAL/PLATELET - Abnormal; Notable for the following components:      Result Value   WBC 11.7 (*)    RBC 3.85 (*)    MCV 110.4 (*)    MCH 36.1 (*)    Neutro Abs 9.1 (*)    Monocytes Absolute 1.1 (*)    All other components within normal limits  COMPREHENSIVE METABOLIC PANEL - Abnormal; Notable for the following components:   Chloride 89 (*)    Glucose, Bld 134 (*)    BUN 33 (*)    Creatinine, Ser 6.80 (*)    Total Protein 8.6 (*)    GFR calc non Af Amer 7 (*)    GFR calc Af Amer 8 (*)    Anion gap 20 (*)    All other components within normal limits  LIPASE, BLOOD - Abnormal; Notable for the  following components:   Lipase 55 (*)    All other components within normal limits  PROTIME-INR - Abnormal; Notable for the following components:   Prothrombin Time 21.4 (*)    INR 1.9 (*)    All other components within normal limits  PROTIME-INR - Abnormal; Notable for the following components:   Prothrombin Time 23.1 (*)    INR 2.1 (*)    All other components within normal limits  COMPREHENSIVE METABOLIC PANEL - Abnormal; Notable for the following components:   Chloride 88 (*)    Glucose, Bld 128 (*)    BUN 39 (*)     Creatinine, Ser 7.84 (*)    GFR calc non Af Amer 6 (*)    GFR calc Af Amer 7 (*)    Anion gap 17 (*)    All other components within normal limits  CBC - Abnormal; Notable for the following components:   WBC 13.1 (*)    RBC 3.52 (*)    Hemoglobin 12.2 (*)    HCT 37.8 (*)    MCV 107.4 (*)    MCH 34.7 (*)    All other components within normal limits  C DIFFICILE QUICK SCREEN W PCR REFLEX  URINALYSIS, ROUTINE W REFLEX MICROSCOPIC    EKG EKG Interpretation  Date/Time:  Saturday March 08 2019 22:10:25 EDT Ventricular Rate:  84 PR Interval:    QRS Duration: 147 QT Interval:  428 QTC Calculation: 506 R Axis:   -59 Text Interpretation:  Sinus rhythm Probable left atrial enlargement Left bundle branch block improved rate and ischemic changes from prior 3/20 Confirmed by Aletta Edouard [phone]) on 03/08/2019 10:42:08 PM   Radiology Ct Abdomen Pelvis Wo Contrast  Result Date: 03/09/2019 CLINICAL DATA:  Right lower quadrant pain. EXAM: CT ABDOMEN AND PELVIS WITHOUT CONTRAST TECHNIQUE: Multidetector CT imaging of the abdomen and pelvis was performed following the standard protocol without IV contrast. COMPARISON:  None. FINDINGS: Lower chest: Cardiomegaly. The lower chest and lungs are otherwise normal. Hepatobiliary: No focal liver abnormality is seen. No gallstones, gallbladder wall thickening, or biliary dilatation. Pancreas: Unremarkable. No pancreatic ductal dilatation or surrounding inflammatory changes. Spleen: Normal in size without focal abnormality. Adrenals/Urinary Tract: The adrenal glands are normal. The kidneys are atrophic without hydronephrosis or perinephric stranding. No stones are identified. Mass is associated with both kidneys are favored to represent hyperdense cysts. A few lower density cysts are noted. The ureters are normal. The bladder is decompressed but unremarkable. Stomach/Bowel: The stomach and small bowel are normal. Majority of the colon is normal. The cecum  appears thick walled as seen on coronal images 51 and 56 as well as axial image 51. There is mild increased attenuation in the fat adjacent to the cecum which is relatively subtle. The appendix is well seen and normal. No evidence of appendicitis. Vascular/Lymphatic: Atherosclerotic changes are seen in the nonaneurysmal aorta. No adenopathy noted. Reproductive: Brachytherapy seeds are seen in the prostate. Other: No other acute abnormalities identified. Musculoskeletal: Severe degenerative changes in the left hip. Degenerative changes in the visualized lumbar spine. IMPRESSION: 1. Evaluation of the colon is limited due to the lack of oral contrast. However, I believe the cecum is thick walled which could be inflammatory or infectious. A neoplastic process is not excluded on today's imaging. Recommend clinical correlation. If the patient has not had a recent colonoscopy, recommend consultation with a gastroenterologist with consideration of a colonoscopy. 2. The appendix is normal in appearance with no appendicitis. 3. Atrophic kidneys with  hyperdense cysts. 4. Atherosclerotic changes in the nonaneurysmal aorta. 5. Degenerative changes in the lumbar spine and left hip. Electronically Signed   By: Dorise Bullion III M.D   On: 03/09/2019 00:39    Procedures Procedures (including critical care time)  Medications Ordered in ED Medications  morphine 4 MG/ML injection 4 mg (has no administration in time range)     Initial Impression / Assessment and Plan / ED Course  I have reviewed the triage vital signs and the nursing notes.  Pertinent labs & imaging results that were available during my care of the patient were reviewed by me and considered in my medical decision making (see chart for details).  Clinical Course as of Mar 09 1039  Sat Apr 04, 295  [id number] 78 year old male with end-stage renal disease here with focal right lower quadrant abdominal pain.  He received some morphine with minimal  improvement in his pain and now is vomiting.  Have ordered him some hydromorphone and some Zofran.  Differential diagnosis includes appendicitis, cholelithiasis, kidney stone, [place], constipation, a GE.  Will need CT imaging.   [MB]    Clinical Course User Index [MB] Hayden Rasmussen, MD         Final Clinical Impressions(s) / ED Diagnoses   Final diagnoses:  Right lower quadrant abdominal pain  Colitis    ED Discharge Orders    None       Hayden Rasmussen, MD 03/09/19 1041

## 2019-03-09 ENCOUNTER — Other Ambulatory Visit: Payer: Self-pay

## 2019-03-09 ENCOUNTER — Encounter (HOSPITAL_COMMUNITY): Payer: Self-pay | Admitting: *Deleted

## 2019-03-09 ENCOUNTER — Emergency Department (HOSPITAL_COMMUNITY): Payer: Medicare HMO

## 2019-03-09 DIAGNOSIS — R935 Abnormal findings on diagnostic imaging of other abdominal regions, including retroperitoneum: Secondary | ICD-10-CM | POA: Diagnosis not present

## 2019-03-09 DIAGNOSIS — K559 Vascular disorder of intestine, unspecified: Secondary | ICD-10-CM | POA: Diagnosis not present

## 2019-03-09 DIAGNOSIS — Z87891 Personal history of nicotine dependence: Secondary | ICD-10-CM | POA: Diagnosis not present

## 2019-03-09 DIAGNOSIS — B20 Human immunodeficiency virus [HIV] disease: Secondary | ICD-10-CM | POA: Diagnosis present

## 2019-03-09 DIAGNOSIS — Z7901 Long term (current) use of anticoagulants: Secondary | ICD-10-CM | POA: Diagnosis not present

## 2019-03-09 DIAGNOSIS — I5042 Chronic combined systolic (congestive) and diastolic (congestive) heart failure: Secondary | ICD-10-CM | POA: Diagnosis present

## 2019-03-09 DIAGNOSIS — I420 Dilated cardiomyopathy: Secondary | ICD-10-CM | POA: Diagnosis present

## 2019-03-09 DIAGNOSIS — I9589 Other hypotension: Secondary | ICD-10-CM | POA: Diagnosis present

## 2019-03-09 DIAGNOSIS — R1032 Left lower quadrant pain: Secondary | ICD-10-CM | POA: Diagnosis not present

## 2019-03-09 DIAGNOSIS — K5900 Constipation, unspecified: Secondary | ICD-10-CM | POA: Diagnosis present

## 2019-03-09 DIAGNOSIS — K219 Gastro-esophageal reflux disease without esophagitis: Secondary | ICD-10-CM | POA: Diagnosis present

## 2019-03-09 DIAGNOSIS — Z992 Dependence on renal dialysis: Secondary | ICD-10-CM | POA: Diagnosis not present

## 2019-03-09 DIAGNOSIS — Z833 Family history of diabetes mellitus: Secondary | ICD-10-CM | POA: Diagnosis not present

## 2019-03-09 DIAGNOSIS — Z79899 Other long term (current) drug therapy: Secondary | ICD-10-CM | POA: Diagnosis not present

## 2019-03-09 DIAGNOSIS — Z9889 Other specified postprocedural states: Secondary | ICD-10-CM | POA: Diagnosis not present

## 2019-03-09 DIAGNOSIS — I43 Cardiomyopathy in diseases classified elsewhere: Secondary | ICD-10-CM | POA: Diagnosis present

## 2019-03-09 DIAGNOSIS — I482 Chronic atrial fibrillation, unspecified: Secondary | ICD-10-CM | POA: Diagnosis not present

## 2019-03-09 DIAGNOSIS — I480 Paroxysmal atrial fibrillation: Secondary | ICD-10-CM | POA: Diagnosis present

## 2019-03-09 DIAGNOSIS — Z8673 Personal history of transient ischemic attack (TIA), and cerebral infarction without residual deficits: Secondary | ICD-10-CM | POA: Diagnosis not present

## 2019-03-09 DIAGNOSIS — I2510 Atherosclerotic heart disease of native coronary artery without angina pectoris: Secondary | ICD-10-CM | POA: Diagnosis present

## 2019-03-09 DIAGNOSIS — R791 Abnormal coagulation profile: Secondary | ICD-10-CM | POA: Diagnosis present

## 2019-03-09 DIAGNOSIS — K529 Noninfective gastroenteritis and colitis, unspecified: Secondary | ICD-10-CM | POA: Diagnosis not present

## 2019-03-09 DIAGNOSIS — K633 Ulcer of intestine: Secondary | ICD-10-CM | POA: Diagnosis present

## 2019-03-09 DIAGNOSIS — R1031 Right lower quadrant pain: Secondary | ICD-10-CM | POA: Diagnosis present

## 2019-03-09 DIAGNOSIS — N186 End stage renal disease: Secondary | ICD-10-CM | POA: Diagnosis present

## 2019-03-09 DIAGNOSIS — K55039 Acute (reversible) ischemia of large intestine, extent unspecified: Secondary | ICD-10-CM | POA: Diagnosis present

## 2019-03-09 DIAGNOSIS — I132 Hypertensive heart and chronic kidney disease with heart failure and with stage 5 chronic kidney disease, or end stage renal disease: Secondary | ICD-10-CM | POA: Diagnosis present

## 2019-03-09 DIAGNOSIS — E785 Hyperlipidemia, unspecified: Secondary | ICD-10-CM | POA: Diagnosis present

## 2019-03-09 DIAGNOSIS — E8889 Other specified metabolic disorders: Secondary | ICD-10-CM | POA: Diagnosis present

## 2019-03-09 DIAGNOSIS — D539 Nutritional anemia, unspecified: Secondary | ICD-10-CM | POA: Diagnosis present

## 2019-03-09 DIAGNOSIS — Z8546 Personal history of malignant neoplasm of prostate: Secondary | ICD-10-CM | POA: Diagnosis not present

## 2019-03-09 LAB — CBC
HCT: 37.8 % — ABNORMAL LOW (ref 39.0–52.0)
Hemoglobin: 12.2 g/dL — ABNORMAL LOW (ref 13.0–17.0)
MCH: 34.7 pg — ABNORMAL HIGH (ref 26.0–34.0)
MCHC: 32.3 g/dL (ref 30.0–36.0)
MCV: 107.4 fL — ABNORMAL HIGH (ref 80.0–100.0)
Platelets: 209 10*3/uL (ref 150–400)
RBC: 3.52 MIL/uL — ABNORMAL LOW (ref 4.22–5.81)
RDW: 14.6 % (ref 11.5–15.5)
WBC: 13.1 10*3/uL — ABNORMAL HIGH (ref 4.0–10.5)
nRBC: 0 % (ref 0.0–0.2)

## 2019-03-09 LAB — COMPREHENSIVE METABOLIC PANEL
ALT: 23 U/L (ref 0–44)
AST: 22 U/L (ref 15–41)
Albumin: 3.9 g/dL (ref 3.5–5.0)
Alkaline Phosphatase: 86 U/L (ref 38–126)
Anion gap: 17 — ABNORMAL HIGH (ref 5–15)
BUN: 39 mg/dL — ABNORMAL HIGH (ref 8–23)
CO2: 31 mmol/L (ref 22–32)
Calcium: 9.6 mg/dL (ref 8.9–10.3)
Chloride: 88 mmol/L — ABNORMAL LOW (ref 98–111)
Creatinine, Ser: 7.84 mg/dL — ABNORMAL HIGH (ref 0.61–1.24)
GFR calc Af Amer: 7 mL/min — ABNORMAL LOW (ref >60)
GFR calc non Af Amer: 6 mL/min — ABNORMAL LOW (ref >60)
Glucose, Bld: 128 mg/dL — ABNORMAL HIGH (ref 70–99)
Potassium: 4.5 mmol/L (ref 3.5–5.1)
Sodium: 136 mmol/L (ref 135–145)
Total Bilirubin: 0.9 mg/dL (ref 0.3–1.2)
Total Protein: 8 g/dL (ref 6.5–8.1)

## 2019-03-09 LAB — PROTIME-INR
INR: 2.1 — ABNORMAL HIGH (ref 0.8–1.2)
Prothrombin Time: 23.1 seconds — ABNORMAL HIGH (ref 11.4–15.2)

## 2019-03-09 MED ORDER — VITAMIN D (ERGOCALCIFEROL) 1.25 MG (50000 UNIT) PO CAPS
50000.0000 [IU] | ORAL_CAPSULE | ORAL | Status: DC
  Administered 2019-03-14: 50000 [IU] via ORAL
  Filled 2019-03-09: qty 1

## 2019-03-09 MED ORDER — BRIMONIDINE TARTRATE 0.15 % OP SOLN
1.0000 [drp] | Freq: Two times a day (BID) | OPHTHALMIC | Status: DC
  Administered 2019-03-09 – 2019-03-18 (×18): 1 [drp] via OPHTHALMIC
  Filled 2019-03-09: qty 5

## 2019-03-09 MED ORDER — PIPERACILLIN-TAZOBACTAM 3.375 G IVPB
3.3750 g | Freq: Two times a day (BID) | INTRAVENOUS | Status: DC
  Administered 2019-03-09 – 2019-03-16 (×14): 3.375 g via INTRAVENOUS
  Filled 2019-03-09 (×16): qty 50

## 2019-03-09 MED ORDER — ACETAMINOPHEN 500 MG PO TABS
1000.0000 mg | ORAL_TABLET | Freq: Four times a day (QID) | ORAL | Status: DC | PRN
  Administered 2019-03-12 – 2019-03-13 (×2): 1000 mg via ORAL
  Filled 2019-03-09 (×6): qty 2

## 2019-03-09 MED ORDER — MIDODRINE HCL 5 MG PO TABS
5.0000 mg | ORAL_TABLET | ORAL | Status: DC
  Administered 2019-03-09 – 2019-03-17 (×6): 5 mg via ORAL
  Filled 2019-03-09 (×6): qty 1

## 2019-03-09 MED ORDER — RENA-VITE PO TABS
1.0000 | ORAL_TABLET | Freq: Every day | ORAL | Status: DC
  Administered 2019-03-09 – 2019-03-18 (×10): 1 via ORAL
  Filled 2019-03-09 (×10): qty 1

## 2019-03-09 MED ORDER — ALLOPURINOL 100 MG PO TABS
100.0000 mg | ORAL_TABLET | Freq: Every day | ORAL | Status: DC
  Administered 2019-03-09 – 2019-03-18 (×10): 100 mg via ORAL
  Filled 2019-03-09 (×10): qty 1

## 2019-03-09 MED ORDER — METRONIDAZOLE IN NACL 5-0.79 MG/ML-% IV SOLN
500.0000 mg | Freq: Once | INTRAVENOUS | Status: AC
  Administered 2019-03-09: 500 mg via INTRAVENOUS
  Filled 2019-03-09: qty 100

## 2019-03-09 MED ORDER — HYDROMORPHONE HCL 1 MG/ML IJ SOLN
1.0000 mg | INTRAMUSCULAR | Status: DC | PRN
  Administered 2019-03-09 – 2019-03-14 (×12): 1 mg via INTRAVENOUS
  Filled 2019-03-09 (×15): qty 1

## 2019-03-09 MED ORDER — LATANOPROST 0.005 % OP SOLN
1.0000 [drp] | Freq: Every day | OPHTHALMIC | Status: DC
  Administered 2019-03-09 – 2019-03-17 (×9): 1 [drp] via OPHTHALMIC
  Filled 2019-03-09: qty 2.5

## 2019-03-09 MED ORDER — BICTEGRAVIR-EMTRICITAB-TENOFOV 50-200-25 MG PO TABS
1.0000 | ORAL_TABLET | Freq: Every day | ORAL | Status: DC
  Administered 2019-03-09 – 2019-03-18 (×10): 1 via ORAL
  Filled 2019-03-09 (×10): qty 1

## 2019-03-09 MED ORDER — CIPROFLOXACIN IN D5W 400 MG/200ML IV SOLN
400.0000 mg | Freq: Once | INTRAVENOUS | Status: AC
  Administered 2019-03-09: 400 mg via INTRAVENOUS
  Filled 2019-03-09: qty 200

## 2019-03-09 MED ORDER — LORATADINE 10 MG PO TABS
10.0000 mg | ORAL_TABLET | Freq: Every day | ORAL | Status: DC
  Administered 2019-03-09 – 2019-03-18 (×10): 10 mg via ORAL
  Filled 2019-03-09 (×10): qty 1

## 2019-03-09 MED ORDER — AMIODARONE HCL 200 MG PO TABS
200.0000 mg | ORAL_TABLET | Freq: Every day | ORAL | Status: DC
  Administered 2019-03-09 – 2019-03-18 (×10): 200 mg via ORAL
  Filled 2019-03-09 (×10): qty 1

## 2019-03-09 MED ORDER — WARFARIN SODIUM 10 MG PO TABS
10.0000 mg | ORAL_TABLET | Freq: Once | ORAL | Status: AC
  Administered 2019-03-09: 10 mg via ORAL
  Filled 2019-03-09: qty 1

## 2019-03-09 MED ORDER — MIDODRINE HCL 5 MG PO TABS
10.0000 mg | ORAL_TABLET | ORAL | Status: DC
  Administered 2019-03-11 – 2019-03-18 (×7): 10 mg via ORAL
  Filled 2019-03-09 (×4): qty 2

## 2019-03-09 MED ORDER — ONDANSETRON HCL 4 MG/2ML IJ SOLN
4.0000 mg | Freq: Four times a day (QID) | INTRAMUSCULAR | Status: DC | PRN
  Administered 2019-03-12 – 2019-03-13 (×2): 4 mg via INTRAVENOUS
  Filled 2019-03-09 (×2): qty 2

## 2019-03-09 MED ORDER — ONDANSETRON HCL 4 MG PO TABS: 4.0000 mg | ORAL_TABLET | Freq: Four times a day (QID) | ORAL | Status: DC | PRN

## 2019-03-09 MED ORDER — FERRIC CITRATE 1 GM 210 MG(FE) PO TABS
420.0000 mg | ORAL_TABLET | Freq: Three times a day (TID) | ORAL | Status: DC
  Administered 2019-03-09 – 2019-03-18 (×19): 420 mg via ORAL
  Filled 2019-03-09 (×22): qty 2

## 2019-03-09 MED ORDER — ONDANSETRON HCL 4 MG/2ML IJ SOLN
4.0000 mg | Freq: Four times a day (QID) | INTRAMUSCULAR | Status: DC | PRN
  Administered 2019-03-09: 4 mg via INTRAVENOUS
  Filled 2019-03-09 (×2): qty 2

## 2019-03-09 MED ORDER — WARFARIN - PHARMACIST DOSING INPATIENT
Freq: Every day | Status: DC
  Administered 2019-03-10: 17:00:00

## 2019-03-09 MED ORDER — DORZOLAMIDE HCL-TIMOLOL MAL 2-0.5 % OP SOLN
1.0000 [drp] | Freq: Two times a day (BID) | OPHTHALMIC | Status: DC
  Administered 2019-03-09 – 2019-03-18 (×18): 1 [drp] via OPHTHALMIC
  Filled 2019-03-09: qty 10

## 2019-03-09 MED ORDER — HYDROMORPHONE HCL 1 MG/ML IJ SOLN
0.5000 mg | Freq: Once | INTRAMUSCULAR | Status: AC
  Administered 2019-03-09: 0.5 mg via INTRAVENOUS
  Filled 2019-03-09: qty 1

## 2019-03-09 NOTE — ED Notes (Signed)
Patient transported to CT 

## 2019-03-09 NOTE — Progress Notes (Signed)
Patient seen and examined this morning, admitted overnight by Dr. Jonelle Sidle.  H&P reviewed, agree with the assessment and plan.  In brief, this is a pleasant 78 year old male with history of ESRD on HD TTS, history of combined systolic and diastolic CHF, hypertension, CAD, HIV, prostate cancer, OSA who came in with right lower quadrant abdominal pain.  He got his hemodialysis on Saturday 4/4, felt well, went home and all of a sudden experienced right lower quadrant abdominal pain associated with nausea and vomiting.  He denies any diarrhea.  CT scan of the abdomen and pelvis show cecal inflammation/colitis, inflammatory versus infectious and he was admitted to the hospital on broad-spectrum antibiotics  He is still dry heaving this morning, breakfast that she has been ordered but he is actively vomiting on my evaluation   Acute cecal colitis -Patient placed on antibiotics with ciprofloxacin and metronidazole, continue.  Malignancy is also on the differential however his symptoms appeared rather sudden.  Will need GI evaluation at one point an outpatient for routine colonoscopy -Nausea and vomiting are persistent, will downgrade to liquid diet, continue supportive management  End-stage renal disease -On TTS schedule, dialyzed on Saturday, he looks euvolemic without issues.  Routine nephrology consultation was obtained  Paroxysmal A. 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  Recently hospitalized for fluid overload and CHF exacerbation but that does not seem to be an issue currently.   Scheduled Meds: . allopurinol  100 mg Oral Daily  . amiodarone  200 mg Oral Daily  . bictegravir-emtricitabine-tenofovir AF  1 tablet Oral Daily  . brimonidine  1 drop Left Eye BID  . dorzolamide-timolol  1 drop  Left Eye BID  . ferric citrate  420 mg Oral TID WC  . latanoprost  1 drop Left Eye QHS  . loratadine  10 mg Oral Daily  . [START ON 03/11/2019] midodrine  10 mg Oral 2 times per day on Tue Thu Sat  . midodrine  5 mg Oral Once per day on Sun Mon Wed Fri  . multivitamin  1 tablet Oral Daily  . [START ON 03/14/2019] Vitamin D (Ergocalciferol)  50,000 Units Oral Q Fri   Continuous Infusions: . piperacillin-tazobactam (ZOSYN)  IV 3.375 g (03/09/19 0433)   PRN Meds:.acetaminophen, HYDROmorphone (DILAUDID) injection, [DISCONTINUED] ondansetron **OR** ondansetron (ZOFRAN) IV, ondansetron (ZOFRAN) IV   Calyn Sivils M. Cruzita Lederer, MD, PhD [facility]  Contact via  [email or web address]  [place] P: [phone]  F: [phone]

## 2019-03-09 NOTE — Progress Notes (Signed)
ANTICOAGULATION CONSULT NOTE - Initial Consult  Pharmacy Consult for warfarin Indication: atrial fibrillation  Allergies  Allergen Reactions  . Cozaar [Losartan Potassium] Other (See Comments)    Causes constipation     Patient Measurements: Height: 6\' 2"  (188 cm) Weight: 173 lb 4.5 oz (78.6 kg) IBW/kg (Calculated) : 82.2   Vital Signs: Temp: 99.3 F (37.4 C) (04/05 0900) Temp Source: Oral (04/05 0900) BP: 105/61 (04/05 0900) Pulse Rate: 89 (04/05 0900)  Labs: Recent Labs    03/08/19 2218 03/08/19 2240 03/09/19 0631  HGB 13.9  --  12.2*  HCT 42.5  --  37.8*  PLT 205  --  209  LABPROT  --  21.4* 23.1*  INR  --  1.9* 2.1*  CREATININE 6.80*  --  7.84*    Estimated Creatinine Clearance: 8.9 mL/min (A) (by C-G formula based on SCr of 7.84 mg/dL (H)).   Medical History: Past Medical History:  Diagnosis Date  . Acute on chronic systolic and diastolic heart failure, NYHA class 4 ([place])   . Anemia, iron deficiency 11/15/2011  . Arthritis    "hands, right knee, feet" (02/21/2018)  . Atrial fibrillation ([place])   . Cancer [facility])    hx of prostate; s/p radioactive seed implant 10/2009 Dr Janice Norrie  . Cardiac arrest ([place]) 02/17/2018  . CHF (congestive heart failure) ([place])   . Chronic lower back pain   . CKD (chronic kidney disease) stage V requiring chronic dialysis ([place])   . ESRD on dialysis ([place])    started 02/2018  . GERD (gastroesophageal reflux disease)   . Glaucoma   . Gout    daily RX (02/21/2018)  . Heart murmur    "mild" per pt  . Hepatitis    years ago  . History of cardiac cath 2004   negative for CAD  . History of myocardial perfusion scan 02/2010   negative for coronary insufficiency (LVEF 27%)  . HIV (human immunodeficiency virus infection) ([place])   . HIV infection ([place])   . Hyperlipidemia   . Hypertension    followed by [facility] and Vascular (Dr Dani Gobble Croitoru)  . Hypertension   . Nonischemic cardiomyopathy ([place])   . Pneumonia 11/2017  .  Prostate cancer ([place])   . Sinus bradycardia   . Sleep apnea    does not use a cpap  . Stroke [name])    "mini stroke" years ago  . Systolic and diastolic CHF, acute ([place]) 06/2010   felt to be secondary to hypertensive cardiomyopathy   Assessment: 78 yo male with h/o Afib on warfarin PTA. H/o ESRD on HD, HIV, and h/o Prostate CA. Admitted with abdominal pain and N/V. Pharmacy consulted to continue warfarin.   Patient on 10mg  daily PTA with last dose given 4/4 in AM. INR today is 2.1.   Goal of Therapy:  INR 2-3 Monitor platelets by anticoagulation protocol: Yes   Plan:  Warfarin 10mg  PO x 1  At 1800 Daily INR Monitor for bleeding  Chad Donoghue A. [place] Dy, PharmD, [place] Please utilize Amion for appropriate phone number to reach the unit pharmacist ([place])   03/09/2019,12:39 PM

## 2019-03-09 NOTE — Progress Notes (Signed)
Report received from ED RN, Sharrie Rothman RN.  Room ready.  Per ED RN, patient vomits with Morphine and she is unable to call MD.  MD paged and made aware that Morphine makes patient vomit.  He will switch order IV Dilaudid.  Earleen Reaper RN

## 2019-03-09 NOTE — Progress Notes (Signed)
Pharmacy Antibiotic Note  [facility] Pucci is a 78 y.o. male admitted on 03/08/2019 with intra abdominal infection.  Pharmacy has been consulted for zosyn dosing.  Plan: Zosyn 3.375 gm IV q12 hours F/u med rec for last dose coumadin. Daily PT/INR  Height: 6\' 2"  (188 cm) Weight: 178 lb (80.7 kg) IBW/kg (Calculated) : 82.2  Temp (24hrs), Avg:98 F (36.7 C), Min:98 F (36.7 C), Max:98 F (36.7 C)  Recent Labs  Lab 03/04/19 0514 03/05/19 0152 03/06/19 0952 03/08/19 2218  WBC 12.9* 8.6  --  11.7*  CREATININE 11.44* 8.18* 11.53* 6.80*    Estimated Creatinine Clearance: 10.5 mL/min (A) (by C-G formula based on SCr of 6.8 mg/dL (H)).    Allergies  Allergen Reactions  . Cozaar [Losartan Potassium] Other (See Comments)    Causes constipation      Thank you for allowing pharmacy to be a part of this patient's care.  Excell Seltzer Poteet 03/09/2019 2:16 AM

## 2019-03-09 NOTE — ED Notes (Signed)
ED TO INPATIENT HANDOFF REPORT  ED Nurse Name and Phone #: [phone]  S Name/Age/Gender [facility] 78 y.o. male Room/Bed: 027C/027C  Code Status   Code Status: Prior  Home/SNF/Other Home Patient oriented to: self Is this baseline? Yes   Triage Complete: Triage complete  Chief Complaint Abd Pain  Triage Note Pt bib PTAR from home d/t RLQ pain that started at noon today.  Pt describes the pain as pressure and rates it 9/10.  +Constipation. Denies any other sx.     Allergies Allergies  Allergen Reactions  . Cozaar [Losartan Potassium] Other (See Comments)    Causes constipation     Level of Care/Admitting Diagnosis ED Disposition    ED Disposition Condition [facility]: [place] [[id number]]  Level of Care: Med-Surg [16]  Diagnosis: Colitis presumed infectious [836629]  Admitting Physician: Elwyn Reach [[id number]]  Attending Physician: Elwyn Reach [[id number]]  Estimated length of stay: past midnight tomorrow  Certification:: I certify this patient will need inpatient services for at least 2 midnights  PT Class (Do Not Modify): Inpatient [101]  PT Acc Code (Do Not Modify): Private [1]       B Medical/Surgery History Past Medical History:  Diagnosis Date  . Acute on chronic systolic and diastolic heart failure, NYHA class 4 ([place])   . Anemia, iron deficiency 11/15/2011  . Arthritis    "hands, right knee, feet" (02/21/2018)  . Atrial fibrillation ([place])   . Cancer [facility])    hx of prostate; s/p radioactive seed implant 10/2009 Dr Janice Norrie  . Cardiac arrest ([place]) 02/17/2018  . CHF (congestive heart failure) ([place])   . Chronic lower back pain   . CKD (chronic kidney disease) stage V requiring chronic dialysis ([place])   . ESRD on dialysis ([place])    started 02/2018  . GERD (gastroesophageal reflux disease)   . Glaucoma   . Gout    daily RX (02/21/2018)  . Heart murmur    "mild" per pt  . Hepatitis    years ago  . History  of cardiac cath 2004   negative for CAD  . History of myocardial perfusion scan 02/2010   negative for coronary insufficiency (LVEF 27%)  . HIV (human immunodeficiency virus infection) ([place])   . HIV infection ([place])   . Hyperlipidemia   . Hypertension    followed by [facility] (Dr Dani Gobble Croitoru)  . Hypertension   . Nonischemic cardiomyopathy ([place])   . Pneumonia 11/2017  . Prostate cancer ([place])   . Sinus bradycardia   . Sleep apnea    does not use a cpap  . Stroke [facility])    "mini stroke" years ago  . Systolic and diastolic CHF, acute ([place]) 06/2010   felt to be secondary to hypertensive cardiomyopathy   Past Surgical History:  Procedure Laterality Date  . AV FISTULA PLACEMENT Right 10/13/2016   Procedure: ARTERIOVENOUS (AV) FISTULA CREATION;  Surgeon: [name], MD;  Location: [place];  Service: Vascular;  Laterality: Right;  . AV FISTULA PLACEMENT Right 10/13/2016   [name] [phone]  . AV FISTULA PLACEMENT Left 02/25/2018   Procedure: INSERTION OF ARTERIOVENOUS (AV) GORE-TEX GRAFT LEFT UPPER ARM;  Surgeon: Angelia Mould, MD;  Location: [place];  Service: Vascular;  Laterality: Left;  . AV FISTULA PLACEMENT Right 08/07/2018   Procedure: Creation of right arm brachiocephalic Fistula;  Surgeon: Waynetta Sandy, MD;  Location: [place];  Service: Vascular;  Laterality: Right;  . BASCILIC VEIN TRANSPOSITION Left 06/24/2015   Procedure: BASILIC VEIN TRANSPOSITION;  Surgeon: Mal Misty, MD;  Location: [place];  Service: Vascular;  Laterality: Left;  . BASCILIC VEIN TRANSPOSITION Left 2/99/3716 Bascilic vein transposition (Left)   [name] [phone]  . CARDIAC CATHETERIZATION  02/04/2003   mildly depressed LV systolic fx EF 17%,PZWCHE coronaries/abdominal aorta/renal arteries.  Marland Kitchen CARDIAC CATHETERIZATION  2004  . CATARACT EXTRACTION, BILATERAL Bilateral   . COLONOSCOPY    . ESOPHAGOGASTRODUODENOSCOPY (EGD) WITH PROPOFOL N/A 05/05/2014   Procedure:  ESOPHAGOGASTRODUODENOSCOPY (EGD) WITH PROPOFOL;  Surgeon: Missy Sabins, MD;  Location: [facility];  Service: Endoscopy;  Laterality: N/A;  . EYE SURGERY Bilateral    cataract surgery   . EYE SURGERY Bilateral    glaucoma surgery  . EYE SURGERY    . GLAUCOMA SURGERY Bilateral   . INSERTION OF ARTERIOVENOUS (AV) ARTEGRAFT ARM  10/09/2018   Procedure: INSERTION OF ARTERIOVENOUS (AV) 6mm x 41cm ARTEGRAFT LEFT UPPER ARM;  Surgeon: Waynetta Sandy, MD;  Location: [place];  Service: Vascular;;  . INSERTION PROSTATE RADIATION SEED    . IR FLUORO GUIDE CV LINE RIGHT  02/18/2018  . IR REMOVAL TUN CV CATH W/O FL  12/06/2018  . IR US GUIDE VASC ACCESS RIGHT  02/18/2018  . LEFT HEART CATH AND CORONARY ANGIOGRAPHY N/A 02/20/2018   Procedure: LEFT HEART CATH AND CORONARY ANGIOGRAPHY;  Surgeon: Jettie Booze, MD;  Location: [place] [facility];  Service: Cardiovascular;  Laterality: N/A;  . NM MYOCAR PERF WALL MOTION  02/21/2010   normal  . RADIOACTIVE SEED IMPLANT  2010   prostate cancer  . THROMBECTOMY W/ EMBOLECTOMY Left 02/26/2018   Procedure: THROMBECTOMY ARTERIOVENOUS GRAFT;  Surgeon: Angelia Mould, MD;  Location: [place];  Service: Vascular;  Laterality: Left;  . ULTRASOUND GUIDANCE FOR VASCULAR ACCESS  02/20/2018   Procedure: Ultrasound Guidance For Vascular Access;  Surgeon: Jettie Booze, MD;  Location: [place] CV [facility];  Service: Cardiovascular;;  . UPPER EXTREMITY VENOGRAPHY N/A 07/08/2018   Procedure: UPPER EXTREMITY VENOGRAPHY;  Surgeon: Waynetta Sandy, MD;  Location: [place] [facility];  Service: Cardiovascular;  Laterality: N/A;  Bilateral  . US ECHOCARDIOGRAPHY  02/06/2012   mild LVH,LA mod. dilated,mild-mod. 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 AI.     A IV Location/Drains/Wounds Patient Lines/Drains/Airways Status   Active Line/Drains/Airways    Name:   Placement date:   Placement time:   Site:   Days:   Peripheral IV  03/08/19 Left Forearm   03/08/19    2248    Forearm   1   Fistula / Graft Right Upper arm   -    -    Upper arm             Intake/Output Last 24 hours No intake or output data in the 24 hours ending 03/09/19 0222  Labs/Imaging Results for orders placed or performed during the hospital encounter of 03/08/19 (from the past 48 hour(s))  CBC with Differential     Status: Abnormal   Collection Time: 03/08/19 10:18 PM  Result Value Ref Range   WBC 11.7 (H) 4.0 - 10.5 K/uL   RBC 3.85 (L) 4.22 - 5.81 MIL/uL   Hemoglobin 13.9 13.0 - 17.0 g/dL   HCT 42.5 39.0 - 52.0 %   MCV 110.4 (H) 80.0 - 100.0 fL   MCH 36.1 (H) 26.0 - 34.0 pg   MCHC 32.7 30.0 -  36.0 g/dL   RDW 15.0 11.5 - 15.5 %   Platelets 205 150 - 400 K/uL   nRBC 0.0 0.0 - 0.2 %   Neutrophils Relative % 78 %   Neutro Abs 9.1 (H) 1.7 - 7.7 K/uL   Lymphocytes Relative 12 %   Lymphs Abs 1.4 0.7 - 4.0 K/uL   Monocytes Relative 10 %   Monocytes Absolute 1.1 (H) 0.1 - 1.0 K/uL   Eosinophils Relative 0 %   Eosinophils Absolute 0.0 0.0 - 0.5 K/uL   Basophils Relative 0 %   Basophils Absolute 0.0 0.0 - 0.1 K/uL   WBC Morphology SMUDGE CELLS     Comment: VACUOLATED NEUTROPHILS   Immature Granulocytes 0 %   Abs Immature Granulocytes 0.05 0.00 - 0.07 K/uL   Polychromasia PRESENT     Comment: Performed at [facility], [place] [address], [place], [place] [address]  Comprehensive metabolic panel     Status: Abnormal   Collection Time: 03/08/19 10:18 PM  Result Value Ref Range   Sodium 136 135 - 145 mmol/L   Potassium 4.4 3.5 - 5.1 mmol/L   Chloride 89 (L) 98 - 111 mmol/L   CO2 27 22 - 32 mmol/L   Glucose, Bld 134 (H) 70 - 99 mg/dL   BUN 33 (H) 8 - 23 mg/dL   Creatinine, Ser 6.80 (H) 0.61 - 1.24 mg/dL   Calcium 9.7 8.9 - 10.3 mg/dL   Total Protein 8.6 (H) 6.5 - 8.1 g/dL   Albumin 4.2 3.5 - 5.0 g/dL   AST 30 15 - 41 U/L   ALT 26 0 - 44 U/L   Alkaline Phosphatase 93 38 - 126 U/L   Total Bilirubin 0.7 0.3 - 1.2 mg/dL   GFR  calc non Af Amer 7 (L) >60 mL/min   GFR calc Af Amer 8 (L) >60 mL/min   Anion gap 20 (H) 5 - 15    Comment: Performed at [facility], [place] [address] [place], [place] [address]  Lipase, blood     Status: Abnormal   Collection Time: 03/08/19 10:18 PM  Result Value Ref Range   Lipase 55 (H) 11 - 51 U/L    Comment: Performed at [place] [address], [place], [place] [address]  Protime-INR     Status: Abnormal   Collection Time: 03/08/19 10:40 PM  Result Value Ref Range   Prothrombin Time 21.4 (H) 11.4 - 15.2 seconds   INR 1.9 (H) 0.8 - 1.2    Comment: (NOTE) INR goal varies based on device and disease states. Performed at [facility], [place] [address] [place],  [address]    Ct Abdomen Pelvis Wo Contrast  Result Date: 03/09/2019 CLINICAL DATA:  Right lower quadrant pain. EXAM: CT ABDOMEN AND PELVIS WITHOUT CONTRAST TECHNIQUE: Multidetector CT imaging of the abdomen and pelvis was performed following the standard protocol without IV contrast. COMPARISON:  None. FINDINGS: Lower chest: Cardiomegaly. The lower chest and lungs are otherwise normal. Hepatobiliary: No focal liver abnormality is seen. No gallstones, gallbladder wall thickening, or biliary dilatation. Pancreas: Unremarkable. No pancreatic ductal dilatation or surrounding inflammatory changes. Spleen: Normal in size without focal abnormality. Adrenals/Urinary Tract: The adrenal glands are normal. The kidneys are atrophic without hydronephrosis or perinephric stranding. No stones are identified. Mass is associated with both kidneys are favored to represent hyperdense cysts. A few lower density cysts are noted. The ureters are normal. The bladder is decompressed but unremarkable. Stomach/Bowel:  The stomach and small bowel are normal. Majority of the colon is normal. The cecum appears thick walled as seen on coronal images 51 and 56 as well as axial image 51. There is mild increased attenuation in the fat  adjacent to the cecum which is relatively subtle. The appendix is well seen and normal. No evidence of appendicitis. Vascular/Lymphatic: Atherosclerotic changes are seen in the nonaneurysmal aorta. No adenopathy noted. Reproductive: Brachytherapy seeds are seen in the prostate. Other: No other acute abnormalities identified. Musculoskeletal: Severe degenerative changes in the left hip. Degenerative changes in the visualized lumbar spine. IMPRESSION: 1. Evaluation of the colon is limited due to the lack of oral contrast. However, I believe the cecum is thick walled which could be inflammatory or infectious. A neoplastic process is not excluded on today's imaging. Recommend clinical correlation. If the patient has not had a recent colonoscopy, recommend consultation with a gastroenterologist with consideration of a colonoscopy. 2. The appendix is normal in appearance with no appendicitis. 3. Atrophic kidneys with hyperdense cysts. 4. Atherosclerotic changes in the nonaneurysmal aorta. 5. Degenerative changes in the lumbar spine and left hip. Electronically Signed   By: Dorise Bullion III M.D   On: 03/09/2019 00:39    Pending Labs Unresulted Labs (From admission, onward)    Start     Ordered   03/09/19 0500  Protime-INR  Daily,   R     03/09/19 0217   03/08/19 2214  Urinalysis, Routine w reflex microscopic  (ED Abdominal Pain)  ONCE - STAT,   STAT     03/08/19 2214   Signed and Held  Comprehensive metabolic panel  Tomorrow morning,   R     Signed and Held   Signed and Held  CBC  Tomorrow morning,   R     Signed and Held   Signed and Held  C difficile quick scan w PCR reflex  (C Difficile quick screen w PCR reflex panel)  Once, for 24 hours,   R     Signed and Held          Vitals/Pain Today's Vitals   03/09/19 0130 03/09/19 0145 03/09/19 0200 03/09/19 0213  BP: 111/70 112/64 115/67   Pulse: 79 80 78   Resp: 17 18 17    Temp:      TempSrc:      SpO2: 97% 97% 97%   Weight:      Height:       PainSc:    9     Isolation Precautions No active isolations  Medications Medications  ciprofloxacin (CIPRO) IVPB 400 mg (400 mg Intravenous New Bag/Given 03/09/19 0219)  metroNIDAZOLE (FLAGYL) IVPB 500 mg (has no administration in time range)  piperacillin-tazobactam (ZOSYN) IVPB 3.375 g (has no administration in time range)  morphine 4 MG/ML injection 4 mg (4 mg Intravenous Given 03/08/19 2251)  HYDROmorphone (DILAUDID) injection 0.5 mg (0.5 mg Intravenous Given 03/09/19 0016)  ondansetron (ZOFRAN) injection 4 mg (4 mg Intravenous Given 03/08/19 2320)  HYDROmorphone (DILAUDID) injection 0.5 mg (0.5 mg Intravenous Given 03/09/19 0214)    Mobility walks with device Low fall risk   Focused Assessments Abdominal Pain   R Recommendations: See Admitting Provider Note  Report given to:   Additional Notes: Pt is A&O x 4.  Walks w/ a cane.  Pt rates RLQ pain 9/10 pressure in nature.  Appendix unremarkable on CT.  Covering for colitis d/t DD.

## 2019-03-09 NOTE — ED Provider Notes (Signed)
Signed out pending CT scan.  In brief, patient presents with right lower quadrant abdominal pain.  History of end-stage renal disease.  CT scan shows normal appendix.  There is some thickening of the cecum which could be inflammatory or infection.  Cannot rule out mass.  Patient reports that he had a normal colonoscopy 3 years ago.  He has persistent abdominal pain on exam and is tender to palpation.  He has received 2 doses of IV pain medication.  He reports that he is also had emesis.  Patient was given Cipro and Flagyl to cover for colitis.  Given his persistent pain and vomiting, will admit for further pain management and IV antibiotics.   Merryl Hacker, MD 03/09/19 [phone]

## 2019-03-09 NOTE — H&P (Signed)
History and Physical   [facility] QZR:[id number] DOB: 1941-09-22 DOA: 03/08/2019  Referring MD/NP/PA: Dr. Melina Copa  PCP: [name], NP   Outpatient Specialists: [facility]  Patient coming from: Home  Chief Complaint: Abdominal pain  HPI: [facility] is a 78 y.o. male with medical history significant of end-stage renal disease on hemodialysis on Tuesdays Thursdays and Saturdays, history of diastolic CHF, hypertension, GERD, coronary artery disease, HIV disease, prostate cancer, obstructive sleep apnea who came to the ER after his hemodialysis today complaining of right lower quadrant abdominal pain.  Symptoms started after his hemodialysis after he got home.  Pain is rated as 6 out of 10.  No radiation.  Associated with some nausea but no diarrhea.  He has actually been constipated.  In the last 3 days he was having difficulties but had one bowel movement on Thursday night.  Patient also had 2 episodes of vomiting today.  At the height of his pain it was actually 9 out of 10.  Patient was here couple of weeks ago with CHF exacerbation.  No travel outside the state and no sick contacts.  Initial evaluation showed cecal inflammation cyst acted colitis although malignancy cannot be ruled out.  He has been admitted to the hospital for treatment.  Patient has no fever or chills..  ED Course: Temperature is 98.1 blood pressure 138/70 pulse 92 respiratory 21 oxygen sats 100% room air.  White count 11.7 hemoglobin 13.9 and platelets of 205.  Sodium 136 potassium 4.4 chloride 89 CO2 27 BUN is 33 creatinine 6.8 and calcium 9.7.  INR is 1.9 and glucose 134.  CT abdomen pelvis showed cecal thickness probably inflammatory or infectious.  A neoplastic process not excluded.  Appendix is normal.  Patient initiated on pain control as well as IV antibiotics and being admitted for possible colitis.  Review of Systems: As per HPI otherwise 10 point review of systems negative.    Past Medical History:  Diagnosis Date  . Acute on chronic systolic and diastolic heart failure, NYHA class 4 ([place])   . Anemia, iron deficiency 11/15/2011  . Arthritis    "hands, right knee, feet" (02/21/2018)  . Atrial fibrillation ([place])   . Cancer [facility])    hx of prostate; s/p radioactive seed implant 10/2009 Dr Janice Norrie  . Cardiac arrest ([place]) 02/17/2018  . CHF (congestive heart failure) ([place])   . Chronic lower back pain   . CKD (chronic kidney disease) stage V requiring chronic dialysis ([place])   . ESRD on dialysis ([place])    started 02/2018  . GERD (gastroesophageal reflux disease)   . Glaucoma   . Gout    daily RX (02/21/2018)  . Heart murmur    "mild" per pt  . Hepatitis    years ago  . History of cardiac cath 2004   negative for CAD  . History of myocardial perfusion scan 02/2010   negative for coronary insufficiency (LVEF 27%)  . HIV (human immunodeficiency virus infection) ([place])   . HIV infection ([place])   . Hyperlipidemia   . Hypertension    followed by [facility] V[facility] (Dr Dani Gobble Croitoru)  . Hypertension   . Nonischemic cardiomyopathy ([place])   . Pneumonia 11/2017  . Prostate cancer ([place])   . Sinus bradycardia   . Sleep apnea    does not use a cpap  . Stroke [facility])    "mini stroke" years ago  . Systolic and diastolic CHF, acute ([place]) 06/2010   felt  to be secondary to hypertensive cardiomyopathy    Past Surgical History:  Procedure Laterality Date  . AV FISTULA PLACEMENT Right 10/13/2016   Procedure: ARTERIOVENOUS (AV) FISTULA CREATION;  Surgeon: [name], MD;  Location: [place];  Service: Vascular;  Laterality: Right;  . AV FISTULA PLACEMENT Right 10/13/2016   [name] [phone]  . AV FISTULA PLACEMENT Left 02/25/2018   Procedure: INSERTION OF ARTERIOVENOUS (AV) GORE-TEX GRAFT LEFT UPPER ARM;  Surgeon: Angelia Mould, MD;  Location: [place];  Service: Vascular;  Laterality: Left;  . AV FISTULA PLACEMENT Right 08/07/2018   Procedure: Creation of right  arm brachiocephalic Fistula;  Surgeon: Waynetta Sandy, MD;  Location: [place];  Service: Vascular;  Laterality: Right;  . BASCILIC VEIN TRANSPOSITION Left 06/24/2015   Procedure: BASILIC VEIN TRANSPOSITION;  Surgeon: Mal Misty, MD;  Location: [place];  Service: Vascular;  Laterality: Left;  . BASCILIC VEIN TRANSPOSITION Left 2/99/2426 Bascilic vein transposition (Left)   [name] [phone]  . CARDIAC CATHETERIZATION  02/04/2003   mildly depressed LV systolic fx EF 97%,[id number] coronaries/abdominal aorta/renal arteries.  Marland Kitchen CARDIAC CATHETERIZATION  2004  . CATARACT EXTRACTION, BILATERAL Bilateral   . COLONOSCOPY    . ESOPHAGOGASTRODUODENOSCOPY (EGD) WITH PROPOFOL N/A 05/05/2014   Procedure: ESOPHAGOGASTRODUODENOSCOPY (EGD) WITH PROPOFOL;  Surgeon: Missy Sabins, MD;  Location: [facility];  Service: Endoscopy;  Laterality: N/A;  . EYE SURGERY Bilateral    cataract surgery   . EYE SURGERY Bilateral    glaucoma surgery  . EYE SURGERY    . GLAUCOMA SURGERY Bilateral   . INSERTION OF ARTERIOVENOUS (AV) ARTEGRAFT ARM  10/09/2018   Procedure: INSERTION OF ARTERIOVENOUS (AV) 33mm x 41cm ARTEGRAFT LEFT UPPER ARM;  Surgeon: Waynetta Sandy, MD;  Location: [name];  Service: Vascular;;  . INSERTION PROSTATE RADIATION SEED    . IR FLUORO GUIDE CV LINE RIGHT  02/18/2018  . IR REMOVAL TUN CV CATH W/O FL  12/06/2018  . IR US GUIDE VASC ACCESS RIGHT  02/18/2018  . LEFT HEART CATH AND CORONARY ANGIOGRAPHY N/A 02/20/2018   Procedure: LEFT HEART CATH AND CORONARY ANGIOGRAPHY;  Surgeon: Jettie Booze, MD;  Location: [place] [facility];  Service: Cardiovascular;  Laterality: N/A;  . NM MYOCAR PERF WALL MOTION  02/21/2010   normal  . RADIOACTIVE SEED IMPLANT  2010   prostate cancer  . THROMBECTOMY W/ EMBOLECTOMY Left 02/26/2018   Procedure: THROMBECTOMY ARTERIOVENOUS GRAFT;  Surgeon: Angelia Mould, MD;  Location: [place];  Service: Vascular;  Laterality: Left;  . ULTRASOUND GUIDANCE FOR  VASCULAR ACCESS  02/20/2018   Procedure: Ultrasound Guidance For Vascular Access;  Surgeon: Jettie Booze, MD;  Location: [place] [facility];  Service: Cardiovascular;;  . UPPER EXTREMITY VENOGRAPHY N/A 07/08/2018   Procedure: UPPER EXTREMITY VENOGRAPHY;  Surgeon: Waynetta Sandy, MD;  Location: [place] [facility];  Service: Cardiovascular;  Laterality: N/A;  Bilateral  . US ECHOCARDIOGRAPHY  02/06/2012   mild LVH,LA mod. dilated,mild-mod. 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 AI.     reports that he quit smoking about 14 years ago. His smoking use included cigarettes. He has a 30.00 pack-year smoking history. He has never used smokeless tobacco. He reports current alcohol use. He reports that he does not use drugs.  Allergies  Allergen Reactions  . Cozaar [Losartan Potassium] Other (See Comments)    Causes constipation     Family History  Problem Relation Age of Onset  . Hypertension  Mother   . Thyroid disease Mother   . Cholelithiasis Daughter   . Cholelithiasis Son   . Hypertension Maternal Grandmother   . Diabetes Maternal Grandmother   . Heart attack Neg Hx   . Hyperlipidemia Neg Hx      Prior to Admission medications   Medication Sig Start Date End Date Taking? Authorizing Provider  acetaminophen (TYLENOL) 500 MG tablet Take 1,000 mg by mouth every 6 (six) hours as needed for mild pain.    [provider]  allopurinol (ZYLOPRIM) 100 MG tablet TAKE 1 TABLET EVERY DAY Patient taking differently: Take 100 mg by mouth daily.  12/23/18   Croitoru, Mihai, MD  amiodarone (PACERONE) 200 MG tablet TAKE 1 TABLET(200 MG) BY MOUTH DAILY Patient taking differently: Take 200 mg by mouth daily.  10/29/18   Croitoru, Mihai, MD  AURYXIA 1 GM 210 MG(Fe) tablet Take 420 mg by mouth 3 (three) times daily with meals.  08/05/18   [provider]  bictegravir-emtricitabine-tenofovir AF ([name]) 50-200-25 MG TABS tablet Take 1 tablet by  mouth daily. 01/01/19   Truman Hayward, MD  brimonidine (ALPHAGAN) 0.15 % ophthalmic solution Place 1 drop into the left eye 2 (two) times daily. 11/15/17   [provider]  dorzolamide-timolol (COSOPT) 22.3-6.8 MG/ML ophthalmic solution Place 1 drop into the left eye 2 (two) times daily. 12/17/17   [provider]  latanoprost (XALATAN) 0.005 % ophthalmic solution Place 1 drop into the left eye at bedtime.     [provider]  loratadine (CLARITIN) 10 MG tablet Take 1 tablet (10 mg total) by mouth daily. 02/21/19   [name], NP  midodrine (PROAMATINE) 10 MG tablet Take 1 tablet (10 mg total) by mouth as directed. Twice a day on the day of HD Tuesday, Thursday, Saturday (1 in AM and 1 at [place]). Take 1/2 tablet (5mg ) a day on non HD days 03/06/19   Shearon Stalls, Rahul P, PA-C  multivitamin (RENA-VIT) TABS tablet Take 1 tablet by mouth daily.    [provider]  Vitamin D, Ergocalciferol, (DRISDOL) 50000 UNITS CAPS capsule Take 50,000 Units by mouth every Friday.  04/10/15   [provider]  warfarin (COUMADIN) 5 MG tablet Take 2 tablets by mouth daily or as directed by coumadin clinic Patient taking differently: Take 10 mg by mouth daily.  01/30/19   Croitoru, Dani Gobble, MD    Physical Exam: Vitals:   03/09/19 0015 03/09/19 0030 03/09/19 0045 03/09/19 0115  BP: 119/70 102/63 114/60 125/71  Pulse: 79 79 79 83  Resp: 16 18 19  (!) 21  Temp:      TempSrc:      SpO2: 99% 96% 96% 100%  Weight:      Height:          Constitutional: NAD, calm, in mild distress due to pain Vitals:   03/09/19 0015 03/09/19 0030 03/09/19 0045 03/09/19 0115  BP: 119/70 102/63 114/60 125/71  Pulse: 79 79 79 83  Resp: 16 18 19  (!) 21  Temp:      TempSrc:      SpO2: 99% 96% 96% 100%  Weight:      Height:       Eyes: PERRL, lids and conjunctivae normal ENMT: Mucous membranes are moist. Posterior pharynx clear of any exudate or lesions.Normal dentition.  Neck:  normal, supple, no masses, no thyromegaly Respiratory: clear to auscultation bilaterally, no wheezing, no crackles. Normal respiratory effort. No accessory muscle use.  Cardiovascular: Regular rate and  rhythm, no murmurs / rubs / gallops. No extremity edema. 2+ pedal pulses. No carotid bruits.  Abdomen: Right lower quadrant abdominal tenderness, no guarding , no masses palpated. No hepatosplenomegaly. Bowel sounds positive.  Musculoskeletal: no clubbing / cyanosis. No joint deformity upper and lower extremities. Good ROM, no contractures. Normal muscle tone.  Skin: no rashes, lesions, ulcers. No induration Neurologic: CN 2-12 grossly intact. Sensation intact, DTR normal. Strength 5/5 in all 4.  Psychiatric: Normal judgment and insight. Alert and oriented x 3. Normal mood.     Labs on Admission: I have personally reviewed following labs and imaging studies  CBC: Recent Labs  Lab 03/04/19 0514 03/04/19 0841 03/04/19 1144 03/05/19 0152 03/08/19 2218  WBC 12.9*  --   --  8.6 11.7*  NEUTROABS 7.1  --   --   --  9.1*  HGB 13.8 13.9 12.6* 12.0* 13.9  HCT 44.8 41.0 37.0* 36.6* 42.5  MCV 113.1*  --   --  107.3* 110.4*  PLT 294  --   --  217 841   Basic Metabolic Panel: Recent Labs  Lab 03/04/19 0514 03/04/19 0841 03/04/19 1144 03/05/19 0152 03/06/19 0952 03/08/19 2218  NA 140 138 138 136 138 136  K 4.0 4.3 4.5 4.4 4.5 4.4  CL 96*  --   --  93* 93* 89*  CO2 19*  --   --  27 26 27   GLUCOSE 236*  --   --  153* 112* 134*  BUN 72*  --   --  41* 88* 33*  CREATININE 11.44*  --   --  8.18* 11.53* 6.80*  CALCIUM 9.4  --   --  9.3 9.2 9.7  MG  --   --   --  2.3 2.4  --   PHOS  --   --   --  4.4 5.8*  --    GFR: Estimated Creatinine Clearance: 10.5 mL/min (A) (by C-G formula based on SCr of 6.8 mg/dL (H)). Liver Function Tests: Recent Labs  Lab 03/04/19 0514 03/08/19 2218  AST 30 30  ALT 28 26  ALKPHOS 116 93  BILITOT 0.8 0.7  PROT 8.3* 8.6*  ALBUMIN 4.2 4.2   Recent Labs   Lab 03/08/19 2218  LIPASE 55*   No results for input(s): AMMONIA in the last 168 hours. Coagulation Profile: Recent Labs  Lab 03/04/19 0924 03/05/19 0152 03/06/19 0952 03/08/19 2240  INR 2.0* 2.0* 1.8* 1.9*   Cardiac Enzymes: Recent Labs  Lab 03/04/19 0514  TROPONINI 0.08*   BNP (last 3 results) Recent Labs    03/11/18 1524  PROBNP 230.0*   HbA1C: No results for input(s): HGBA1C in the last 72 hours. CBG: Recent Labs  Lab 03/05/19 2029 03/05/19 2349 03/06/19 0406 03/06/19 1125 03/06/19 1756  GLUCAP 127* 99 112* 114* 87   Lipid Profile: No results for input(s): CHOL, HDL, LDLCALC, TRIG, CHOLHDL, LDLDIRECT in the last 72 hours. Thyroid Function Tests: Recent Labs    03/06/19 0952  TSH 1.170   Anemia Panel: No results for input(s): VITAMINB12, FOLATE, FERRITIN, TIBC, IRON, RETICCTPCT in the last 72 hours. Urine analysis:    Component Value Date/Time   COLORURINE YELLOW 02/17/2018 0540   APPEARANCEUR CLOUDY (A) 02/17/2018 0540   LABSPEC 1.010 02/17/2018 0540   PHURINE 5.0 02/17/2018 0540   GLUCOSEU NEGATIVE 02/17/2018 0540   HGBUR LARGE (A) 02/17/2018 0540   BILIRUBINUR NEGATIVE 02/17/2018 0540   BILIRUBINUR negative 04/16/2013 [place] 02/17/2018 0540  PROTEINUR 100 (A) 02/17/2018 0540   UROBILINOGEN 0.2 05/11/2014 1352   NITRITE NEGATIVE 02/17/2018 0540   LEUKOCYTESUR NEGATIVE 02/17/2018 0540   Sepsis Labs: @LABRCNTIP (procalcitonin:4,lacticidven:4) ) Recent Results (from the past 240 hour(s))  Respiratory Panel by PCR     Status: None   Collection Time: 03/04/19  8:15 AM  Result Value Ref Range Status   Adenovirus NOT DETECTED NOT DETECTED Final   Coronavirus 229E NOT DETECTED NOT DETECTED Final    Comment: (NOTE) The Coronavirus on the Respiratory Panel, DOES NOT test for the novel  Coronavirus (2019 nCoV)    Coronavirus HKU1 NOT DETECTED NOT DETECTED Final   Coronavirus NL63 NOT DETECTED NOT DETECTED Final    Coronavirus OC43 NOT DETECTED NOT DETECTED Final   Metapneumovirus NOT DETECTED NOT DETECTED Final   Rhinovirus / Enterovirus NOT DETECTED NOT DETECTED Final   Influenza A NOT DETECTED NOT DETECTED Final   Influenza B NOT DETECTED NOT DETECTED Final   Parainfluenza Virus 1 NOT DETECTED NOT DETECTED Final   Parainfluenza Virus 2 NOT DETECTED NOT DETECTED Final   Parainfluenza Virus 3 NOT DETECTED NOT DETECTED Final   Parainfluenza Virus 4 NOT DETECTED NOT DETECTED Final   Respiratory Syncytial Virus NOT DETECTED NOT DETECTED Final   Bordetella pertussis NOT DETECTED NOT DETECTED Final   Chlamydophila pneumoniae NOT DETECTED NOT DETECTED Final   Mycoplasma pneumoniae NOT DETECTED NOT DETECTED Final    Comment: Performed at [facility], [place]. [address], [place], [place] [address]     Radiological Exams on Admission: Ct Abdomen Pelvis Wo Contrast  Result Date: 03/09/2019 CLINICAL DATA:  Right lower quadrant pain. EXAM: CT ABDOMEN AND PELVIS WITHOUT CONTRAST TECHNIQUE: Multidetector CT imaging of the abdomen and pelvis was performed following the standard protocol without IV contrast. COMPARISON:  None. FINDINGS: Lower chest: Cardiomegaly. The lower chest and lungs are otherwise normal. Hepatobiliary: No focal liver abnormality is seen. No gallstones, gallbladder wall thickening, or biliary dilatation. Pancreas: Unremarkable. No pancreatic ductal dilatation or surrounding inflammatory changes. Spleen: Normal in size without focal abnormality. Adrenals/Urinary Tract: The adrenal glands are normal. The kidneys are atrophic without hydronephrosis or perinephric stranding. No stones are identified. Mass is associated with both kidneys are favored to represent hyperdense cysts. A few lower density cysts are noted. The ureters are normal. The bladder is decompressed but unremarkable. Stomach/Bowel: The stomach and small bowel are normal. Majority of the colon is normal. The cecum appears thick walled as  seen on coronal images 51 and 56 as well as axial image 51. There is mild increased attenuation in the fat adjacent to the cecum which is relatively subtle. The appendix is well seen and normal. No evidence of appendicitis. Vascular/Lymphatic: Atherosclerotic changes are seen in the nonaneurysmal aorta. No adenopathy noted. Reproductive: Brachytherapy seeds are seen in the prostate. Other: No other acute abnormalities identified. Musculoskeletal: Severe degenerative changes in the left hip. Degenerative changes in the visualized lumbar spine. IMPRESSION: 1. Evaluation of the colon is limited due to the lack of oral contrast. However, I believe the cecum is thick walled which could be inflammatory or infectious. A neoplastic process is not excluded on today's imaging. Recommend clinical correlation. If the patient has not had a recent colonoscopy, recommend consultation with a gastroenterologist with consideration of a colonoscopy. 2. The appendix is normal in appearance with no appendicitis. 3. Atrophic kidneys with hyperdense cysts. 4. Atherosclerotic changes in the nonaneurysmal aorta. 5. Degenerative changes in the lumbar spine and left hip. Electronically Signed  By: Dorise Bullion III M.D   On: 03/09/2019 00:39    EKG: Independently reviewed.  EKG showed normal sinus rhythm with a rate of 84.  Left bundle branch block, evidence of hypertrophy by voltage criteria.  No change from previous EKG  Assessment/Plan Principal Problem:   Colitis presumed infectious Active Problems:   ADENOCARCINOMA, PROSTATE, GLEASON GRADE 3   Hyperlipidemia   Nonischemic dilated cardiomyopathy (HCC)   ESRD on hemodialysis (HCC)   HIV (human immunodeficiency virus infection) (HCC)   Atrial fibrillation, chronic   Chronic anticoagulation   CAD (coronary artery disease)   Gastroesophageal reflux disease     #1 acute colitis: Most likely at the cecal area.  Malignancy not excluded but patient symptoms are sudden.  He  had prior history of prostate cancer and is at risk.  We will treat him for infectious colitis initially.  Supportive care and pain management.  Patient was constipated also which may have contributed.  Scheduled laxatives.  If no immediate improvement GI consultation.  Check stool studies including C. difficile  #2 end-stage renal disease: On hemodialysis Tuesdays Thursdays and Saturdays.  He was dialyzed today so no hemodialysis at this point.  #3 HIV disease: Continue care for his infectious disease.  #4 paroxysmal atrial fibrillation: Currently in sinus rhythm.  Continue warfarin and rate control.  INR is 1.9 today.  #4 history of prostate cancer: In remission at the moment.  #5 GERD: Continue with PPIs.  #6 nonischemic dilated cardiomyopathy: Compensated with no significant symptoms at the moment.   DVT prophylaxis: Warfarin Code Status: Full code Family Communication: No family at bedside Disposition Plan: Home Consults called: None Admission status: Inpatient  Severity of Illness: The appropriate patient status for this patient is INPATIENT. Inpatient status is judged to be reasonable and necessary in order to provide the required intensity of service to ensure the patient's safety. The patient's presenting symptoms, physical exam findings, and initial radiographic and laboratory data in the context of their chronic comorbidities is felt to place them at high risk for further clinical deterioration. Furthermore, it is not anticipated that the patient will be medically stable for discharge from the hospital within 2 midnights of admission. The following factors support the patient status of inpatient.   " The patient's presenting symptoms include abdominal pain. " The worrisome physical exam findings include right lower quadrant tenderness. " The initial radiographic and laboratory data are worrisome because of CT evidence of colitis. " The chronic co-morbidities include end-stage  renal disease and HIV.   * I certify that at the point of admission it is my clinical judgment that the patient will require inpatient hospital care spanning beyond 2 midnights from the point of admission due to high intensity of service, high risk for further deterioration and high frequency of surveillance required.Barbette Merino MD [facility] Pager [phone]  If 7PM-7AM, please contact night-coverage [email or web address] Password [email or web address]  03/09/2019, 1:58 AM

## 2019-03-09 NOTE — Progress Notes (Addendum)
[facility] Progress Note   Interval History: 78 yo male with ESRD on HD, TTS, CHF EF 25%, HTN, HIV, hx prostate cancer who was recently discharged from [facility] on 03/06/19 after admission for acute respiratory failure 2/2 volume overload/CHF exacerbation. Now admitted with acute cecal colitis.  Asked to see for routine dialysis needs.   Seen at bedside. Alert, abdominal pain improved from yesterday. Still nauseated with PO. Denies CP,SOB.   Objective Vitals:   03/09/19 0145 03/09/19 0200 03/09/19 0314 03/09/19 0900  BP: 112/64 115/67 131/62 105/61  Pulse: 80 78 92 89  Resp: 18 17 18 18   Temp:   98.1 F (36.7 C) 99.3 F (37.4 C)  TempSrc:   Oral Oral  SpO2: 97% 97% 100% 94%  Weight:   78.6 kg   Height:        Physical Exam General: WNWD elderly male NAD  Heart: RRR Lungs: CTAB  Abdomen: soft mild tenderness RLQ. No guarding Extremities: No LE edema  Dialysis Access: RUA AVG +bruit    Weight change:    Additional Objective Labs: Basic Metabolic Panel: Recent Labs  Lab 03/05/19 0152 03/06/19 0952 03/08/19 2218 03/09/19 0631  NA 136 138 136 136  K 4.4 4.5 4.4 4.5  CL 93* 93* 89* 88*  CO2 27 26 27 31   GLUCOSE 153* 112* 134* 128*  BUN 41* 88* 33* 39*  CREATININE 8.18* 11.53* 6.80* 7.84*  CALCIUM 9.3 9.2 9.7 9.6  PHOS 4.4 5.8*  --   --    CBC: Recent Labs  Lab 03/04/19 0514  03/05/19 0152 03/08/19 2218 03/09/19 0631  WBC 12.9*  --  8.6 11.7* 13.1*  NEUTROABS 7.1  --   --  9.1*  --   HGB 13.8   < > 12.0* 13.9 12.2*  HCT 44.8   < > 36.6* 42.5 37.8*  MCV 113.1*  --  107.3* 110.4* 107.4*  PLT 294  --  217 205 209   < > = values in this interval not displayed.   Blood Culture    Component Value Date/Time   SDES BLOOD LEFT FOREARM 02/11/2019 0030   SPECREQUEST  02/11/2019 0030    BOTTLES DRAWN AEROBIC AND ANAEROBIC Blood Culture results may not be optimal due to an inadequate volume of blood received in culture bottles   CULT  02/11/2019 0030    NO GROWTH 5 DAYS Performed at [facility], [place] [address] [place], [place] [address]    REPTSTATUS 02/16/2019 FINAL 02/11/2019 0030     Medications: . piperacillin-tazobactam (ZOSYN)  IV 3.375 g (03/09/19 0433)   . allopurinol  100 mg Oral Daily  . amiodarone  200 mg Oral Daily  . bictegravir-emtricitabine-tenofovir AF  1 tablet Oral Daily  . brimonidine  1 drop Left Eye BID  . dorzolamide-timolol  1 drop Left Eye BID  . ferric citrate  420 mg Oral TID WC  . latanoprost  1 drop Left Eye QHS  . loratadine  10 mg Oral Daily  . [START ON 03/11/2019] midodrine  10 mg Oral 2 times per day on Tue Thu Sat  . midodrine  5 mg Oral Once per day on Sun Mon Wed Fri  . multivitamin  1 tablet Oral Daily  . [START ON 03/14/2019] Vitamin D (Ergocalciferol)  50,000 Units Oral Q Fri    Dialysis Orders:  [facility] 4h 2/2 bath 80.5kg 400/800 P2 RUA AVF Hep 5800 Calcitriol 0.78mcg PO TIW   Assessment/Plan: 1. Acute colitis. CT  abd -thick walled cecum. Infectious vs inflammatory etiology. Neoplastic process not excluded. May need GI eval/colonocospy. Empiric antibiotics started per primary. CL diet.   2. ESRD - HD TTS. K 4.5. Completed full treatment Saturday. No urgent HD needs. Next HD 4/7 on schedule.   3. HTN/volume- BP ok/soft. Takes midodrine 10 on HD days. EDW lowered after last admission. Post HD wt yesterday 81.8kg with hypotensive event reported by patient.   4. Anemia-  Hgb 12.2. No ESA needs. Mircera 75 last dosed 3/26 as outpatient.   5. Metabolic Bone Disease- Continue binders/vdra   6. P Afib. On amiodarone, warfarin per pharmacy   7. NICM EF 25%  8. HIV. On ART    [name] PA-C [facility] Pager [phone] 03/09/2019,12:21 PM  LOS: 0 days   Pt seen, examined and agree w A/P as above.  [facility] 03/09/2019, 5:17 PM

## 2019-03-10 ENCOUNTER — Telehealth: Payer: Self-pay

## 2019-03-10 DIAGNOSIS — I482 Chronic atrial fibrillation, unspecified: Secondary | ICD-10-CM

## 2019-03-10 DIAGNOSIS — I2510 Atherosclerotic heart disease of native coronary artery without angina pectoris: Secondary | ICD-10-CM

## 2019-03-10 DIAGNOSIS — Z7901 Long term (current) use of anticoagulants: Secondary | ICD-10-CM

## 2019-03-10 DIAGNOSIS — K219 Gastro-esophageal reflux disease without esophagitis: Secondary | ICD-10-CM

## 2019-03-10 DIAGNOSIS — Z992 Dependence on renal dialysis: Secondary | ICD-10-CM

## 2019-03-10 DIAGNOSIS — N186 End stage renal disease: Secondary | ICD-10-CM

## 2019-03-10 LAB — PROTIME-INR
INR: 2.7 — ABNORMAL HIGH (ref 0.8–1.2)
Prothrombin Time: 28 seconds — ABNORMAL HIGH (ref 11.4–15.2)

## 2019-03-10 MED ORDER — WARFARIN SODIUM 5 MG PO TABS
5.0000 mg | ORAL_TABLET | Freq: Once | ORAL | Status: AC
  Administered 2019-03-10: 5 mg via ORAL
  Filled 2019-03-10: qty 1

## 2019-03-10 MED ORDER — CHLORHEXIDINE GLUCONATE CLOTH 2 % EX PADS
6.0000 | MEDICATED_PAD | Freq: Every day | CUTANEOUS | Status: DC
  Administered 2019-03-10: 6 via TOPICAL

## 2019-03-10 NOTE — Progress Notes (Addendum)
ANTICOAGULATION CONSULT NOTE - Initial Consult  Pharmacy Consult for warfarin Indication: atrial fibrillation  Allergies  Allergen Reactions  . Cozaar [Losartan Potassium] Other (See Comments)    Causes constipation     Patient Measurements: Height: 6\' 2"  (188 cm) Weight: 173 lb 4.5 oz (78.6 kg) IBW/kg (Calculated) : 82.2   Vital Signs: Temp: 98.3 F (36.8 C) (04/06 0458) Temp Source: Oral (04/06 0458) BP: 115/71 (04/06 0458) Pulse Rate: 84 (04/06 0458)  Labs: Recent Labs    03/08/19 2218 03/08/19 2240 03/09/19 0631 03/10/19 0548  HGB 13.9  --  12.2*  --   HCT 42.5  --  37.8*  --   PLT 205  --  209  --   LABPROT  --  21.4* 23.1* 28.0*  INR  --  1.9* 2.1* 2.7*  CREATININE 6.80*  --  7.84*  --     Estimated Creatinine Clearance: 8.9 mL/min (A) (by C-G formula based on SCr of 7.84 mg/dL (H)).   Medical History: Past Medical History:  Diagnosis Date  . Acute on chronic systolic and diastolic heart failure, NYHA class 4 ([place])   . Anemia, iron deficiency 11/15/2011  . Arthritis    "hands, right knee, feet" (02/21/2018)  . Atrial fibrillation ([place])   . Cancer [facility])    hx of prostate; s/p radioactive seed implant 10/2009 Dr Janice Norrie  . Cardiac arrest ([place]) 02/17/2018  . CHF (congestive heart failure) ([place])   . Chronic lower back pain   . CKD (chronic kidney disease) stage V requiring chronic dialysis ([place])   . ESRD on dialysis ([place])    started 02/2018  . GERD (gastroesophageal reflux disease)   . Glaucoma   . Gout    daily RX (02/21/2018)  . Heart murmur    "mild" per pt  . Hepatitis    years ago  . History of cardiac cath 2004   negative for CAD  . History of myocardial perfusion scan 02/2010   negative for coronary insufficiency (LVEF 27%)  . HIV (human immunodeficiency virus infection) ([place])   . HIV infection ([place])   . Hyperlipidemia   . Hypertension    followed by [facility] (Dr Dani Gobble Croitoru)  . Hypertension   . Nonischemic  cardiomyopathy ([place])   . Pneumonia 11/2017  . Prostate cancer ([place])   . Sinus bradycardia   . Sleep apnea    does not use a cpap  . Stroke [facility])    "mini stroke" years ago  . Systolic and diastolic CHF, acute ([place]) 06/2010   felt to be secondary to hypertensive cardiomyopathy   Assessment: 78 yo male on Coumadin 5mg  daily PTA for PAF. INR up to 2.7. Hgb 12.2, plts wnl.  Goal of Therapy:  INR 2-3 Monitor platelets by anticoagulation protocol: Yes   Plan:  Give Coumadin 5mg  PO x 1 Monitor daily INR, CBC, s/s of bleed  Elenor Quinones, PharmD, BCPS, BCIDP Clinical Pharmacist 03/10/2019 8:46 AM

## 2019-03-10 NOTE — Telephone Encounter (Signed)
TCM Hospital follow up call made to patient. Left message for return call. 

## 2019-03-10 NOTE — Progress Notes (Signed)
PROGRESS NOTE  David [name] DQQ:[id number] DOB: [birth year] DOA: 03/08/2019 PCP: [name], NP   LOS: 1 day   Brief Narrative / Interim history: 78 year old male with history of ESRD on HD TTS, history of combined systolic and diastolic CHF, hypertension, CAD, HIV, prostate cancer, OSA who came in with right lower quadrant abdominal pain.  He got his hemodialysis on Saturday 4/4, felt well, went home and all of a sudden experienced right lower quadrant abdominal pain associated with nausea and vomiting.  He denies any diarrhea.  CT scan of the abdomen and pelvis show cecal inflammation/colitis, inflammatory versus infectious and he was admitted to the hospital on broad-spectrum antibiotics  Subjective: -States that his abdominal pain has improved some, had one episode of vomiting last night but nothing since.  He finished his breakfast and states that he feels better.  Denies any fever or chills.  Assessment & Plan: Principal Problem:   Colitis presumed infectious Active Problems:   ADENOCARCINOMA, PROSTATE, GLEASON GRADE 3   Hyperlipidemia   Nonischemic dilated cardiomyopathy (HCC)   ESRD on hemodialysis (HCC)   HIV (human immunodeficiency virus infection) (HCC)   Atrial fibrillation, chronic   Chronic anticoagulation   CAD (coronary artery disease)   Gastroesophageal reflux disease   Principal Problem Acute cecal colitis -Patient placed on antibiotics with ciprofloxacin and metronidazole, continue.  Malignancy is also on the differential however his symptoms appeared rather sudden.  Will need GI evaluation at one point an outpatient for routine colonoscopy but that can be done as an outpatient -He seems to be tolerating a liquid diet, no longer has nausea and vomiting this morning, his abdominal pain is improving, will slowly advance his diet to full liquids today  Active Problems End-stage renal disease -On TTS schedule, dialyzed on Saturday, he looks euvolemic  without issues. -Nephrology consulted, he is scheduled for dialysis likely tomorrow  Paroxysmal A. 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  Recently hospitalized for fluid overload and CHF exacerbation but that does not seem to be an issue currently. -Appears euvolemic, volume status per dialysis  Scheduled Meds:  allopurinol  100 mg Oral Daily   amiodarone  200 mg Oral Daily   bictegravir-emtricitabine-tenofovir AF  1 tablet Oral Daily   brimonidine  1 drop Left Eye BID   dorzolamide-timolol  1 drop Left Eye BID   ferric citrate  420 mg Oral TID WC   latanoprost  1 drop Left Eye QHS   loratadine  10 mg Oral Daily   [START ON 03/11/2019] midodrine  10 mg Oral 2 times per day on Tue Thu Sat   midodrine  5 mg Oral Once per day on Sun Mon Wed Fri   multivitamin  1 tablet Oral Daily   [START ON 03/14/2019] Vitamin D (Ergocalciferol)  50,000 Units Oral Q Fri   warfarin  5 mg Oral ONCE-1800   Warfarin - Pharmacist Dosing Inpatient   Does not apply q1800   Continuous Infusions:  piperacillin-tazobactam (ZOSYN)  IV 3.375 g (03/10/19 0357)   PRN Meds:.acetaminophen, HYDROmorphone (DILAUDID) injection, [DISCONTINUED] ondansetron **OR** ondansetron (ZOFRAN) IV, ondansetron (ZOFRAN) IV  DVT prophylaxis: On Coumadin Code Status: Full code Family Communication: No family at bedside Disposition Plan: Home likely 2 days once tolerating diet  Consultants:   Nephrology  Procedures:   None  Antimicrobials:  Ciprofloxacin/metronidazole 4/5 >>  Objective: Vitals:   03/09/19 0900 03/09/19 1642 03/09/19 2039 03/10/19 0458  BP: 105/61 111/68 103/66 115/71  Pulse: 89 87 87 84  Resp: 18 18 18 18   Temp: 99.3 F (37.4 C) 98.4 F (36.9 C) 98.9 F (37.2 C) 98.3 F (36.8  C)  TempSrc: Oral Oral Oral Oral  SpO2: 94% 96% 95% 96%  Weight:   78.6 kg   Height:        Intake/Output Summary (Last 24 hours) at 03/10/2019 0905 Last data filed at 03/10/2019 0857 Gross per 24 hour  Intake 1575.43 ml  Output 0 ml  Net 1575.43 ml   Filed Weights   03/08/19 2214 03/09/19 0314 03/09/19 2039  Weight: 80.7 kg 78.6 kg 78.6 kg    Examination:  Constitutional: NAD Eyes: PERRL, lids and conjunctivae normal ENMT: Mucous membranes are moist. Respiratory: clear to auscultation bilaterally, no wheezing, no crackles. Cardiovascular: Regular rate and rhythm, no murmurs / rubs / gallops. No Edema Abdomen: Mild tenderness to palpation right lower quadrant, no guarding, no rebound.  Bowel sounds positive Musculoskeletal: no clubbing / cyanosis.  Skin: no rashes Neurologic: CN 2-12 grossly intact. Strength 5/5 in all 4.  Psychiatric: Normal judgment and insight. Alert and oriented x 3. Normal mood.    Data Reviewed: I have independently reviewed following labs and imaging studies   CBC: Recent Labs  Lab 03/04/19 0514 03/04/19 0841 03/04/19 1144 03/05/19 0152 03/08/19 2218 03/09/19 0631  WBC 12.9*  --   --  8.6 11.7* 13.1*  NEUTROABS 7.1  --   --   --  9.1*  --   HGB 13.8 13.9 12.6* 12.0* 13.9 12.2*  HCT 44.8 41.0 37.0* 36.6* 42.5 37.8*  MCV 113.1*  --   --  107.3* 110.4* 107.4*  PLT 294  --   --  217 205 242   Basic Metabolic Panel: Recent Labs  Lab 03/04/19 0514  03/04/19 1144 03/05/19 0152 03/06/19 0952 03/08/19 2218 03/09/19 0631  NA 140   < > 138 136 138 136 136  K 4.0   < > 4.5 4.4 4.5 4.4 4.5  CL 96*  --   --  93* 93* 89* 88*  CO2 19*  --   --  27 26 27 31   GLUCOSE 236*  --   --  153* 112* 134* 128*  BUN 72*  --   --  41* 88* 33* 39*  CREATININE 11.44*  --   --  8.18* 11.53* 6.80* 7.84*  CALCIUM 9.4  --   --  9.3 9.2 9.7 9.6  MG  --   --   --  2.3 2.4  --   --   PHOS  --   --   --  4.4 5.8*  --   --    < > = values in this interval not  displayed.   GFR: Estimated Creatinine Clearance: 8.9 mL/min (A) (by C-G formula based on SCr of 7.84 mg/dL (H)). Liver Function Tests: Recent Labs  Lab 03/04/19 0514 03/08/19 2218 03/09/19 0631  AST 30 30 22   ALT 28 26 23   ALKPHOS 116 93 86  BILITOT 0.8 0.7 0.9  PROT 8.3* 8.6* 8.0  ALBUMIN 4.2 4.2 3.9   Recent Labs  Lab 03/08/19 2218  LIPASE 55*   No results for input(s): AMMONIA in the last 168 hours. Coagulation Profile: Recent Labs  Lab 03/05/19 0152 03/06/19 6834 03/08/19 2240 03/09/19 0631 03/10/19 0548  INR 2.0*  1.8* 1.9* 2.1* 2.7*   Cardiac Enzymes: Recent Labs  Lab 03/04/19 0514  TROPONINI 0.08*   BNP (last 3 results) Recent Labs    03/11/18 1524  PROBNP 230.0*   HbA1C: No results for input(s): HGBA1C in the last 72 hours. CBG: Recent Labs  Lab 03/05/19 2029 03/05/19 2349 03/06/19 0406 03/06/19 1125 03/06/19 1756  GLUCAP 127* 99 112* 114* 87   Lipid Profile: No results for input(s): CHOL, HDL, LDLCALC, TRIG, CHOLHDL, LDLDIRECT in the last 72 hours. Thyroid Function Tests: No results for input(s): TSH, T4TOTAL, FREET4, T3FREE, THYROIDAB in the last 72 hours. Anemia Panel: No results for input(s): VITAMINB12, FOLATE, FERRITIN, TIBC, IRON, RETICCTPCT in the last 72 hours. Urine analysis:    Component Value Date/Time   COLORURINE YELLOW 02/17/2018 0540   APPEARANCEUR CLOUDY (A) 02/17/2018 0540   LABSPEC 1.010 02/17/2018 0540   PHURINE 5.0 02/17/2018 0540   GLUCOSEU NEGATIVE 02/17/2018 0540   HGBUR LARGE (A) 02/17/2018 0540   BILIRUBINUR NEGATIVE 02/17/2018 0540   BILIRUBINUR negative 04/16/2013 1447   KETONESUR NEGATIVE 02/17/2018 0540   PROTEINUR 100 (A) 02/17/2018 0540   UROBILINOGEN 0.2 05/11/2014 1352   NITRITE NEGATIVE 02/17/2018 0540   LEUKOCYTESUR NEGATIVE 02/17/2018 0540   Sepsis Labs: Invalid input(s): PROCALCITONIN, LACTICIDVEN  Recent Results (from the past 240 hour(s))  Respiratory Panel by PCR     Status: None    Collection Time: 03/04/19  8:15 AM  Result Value Ref Range Status   Adenovirus NOT DETECTED NOT DETECTED Final   Coronavirus 229E NOT DETECTED NOT DETECTED Final    Comment: (NOTE) The Coronavirus on the Respiratory Panel, DOES NOT test for the novel  Coronavirus (2019 nCoV)    Coronavirus HKU1 NOT DETECTED NOT DETECTED Final   Coronavirus NL63 NOT DETECTED NOT DETECTED Final   Coronavirus OC43 NOT DETECTED NOT DETECTED Final   Metapneumovirus NOT DETECTED NOT DETECTED Final   Rhinovirus / Enterovirus NOT DETECTED NOT DETECTED Final   Influenza A NOT DETECTED NOT DETECTED Final   Influenza B NOT DETECTED NOT DETECTED Final   Parainfluenza Virus 1 NOT DETECTED NOT DETECTED Final   Parainfluenza Virus 2 NOT DETECTED NOT DETECTED Final   Parainfluenza Virus 3 NOT DETECTED NOT DETECTED Final   Parainfluenza Virus 4 NOT DETECTED NOT DETECTED Final   Respiratory Syncytial Virus NOT DETECTED NOT DETECTED Final   Bordetella pertussis NOT DETECTED NOT DETECTED Final   Chlamydophila pneumoniae NOT DETECTED NOT DETECTED Final   Mycoplasma pneumoniae NOT DETECTED NOT DETECTED Final    Comment: Performed at [facility], [address], [place], [place] [address]      Radiology Studies: Ct Abdomen Pelvis Wo Contrast  Result Date: 03/09/2019 CLINICAL DATA:  Right lower quadrant pain. EXAM: CT ABDOMEN AND PELVIS WITHOUT CONTRAST TECHNIQUE: Multidetector CT imaging of the abdomen and pelvis was performed following the standard protocol without IV contrast. COMPARISON:  None. FINDINGS: Lower chest: Cardiomegaly. The lower chest and lungs are otherwise normal. Hepatobiliary: No focal liver abnormality is seen. No gallstones, gallbladder wall thickening, or biliary dilatation. Pancreas: Unremarkable. No pancreatic ductal dilatation or surrounding inflammatory changes. Spleen: Normal in size without focal abnormality. Adrenals/Urinary Tract: The adrenal glands are normal. The kidneys are atrophic  without hydronephrosis or perinephric stranding. No stones are identified. Mass is associated with both kidneys are favored to represent hyperdense cysts. A few lower density cysts are noted. The ureters are normal. The bladder is decompressed but unremarkable. Stomach/Bowel: The stomach and small bowel are  normal. Majority of the colon is normal. The cecum appears thick walled as seen on coronal images 51 and 56 as well as axial image 51. There is mild increased attenuation in the fat adjacent to the cecum which is relatively subtle. The appendix is well seen and normal. No evidence of appendicitis. Vascular/Lymphatic: Atherosclerotic changes are seen in the nonaneurysmal aorta. No adenopathy noted. Reproductive: Brachytherapy seeds are seen in the prostate. Other: No other acute abnormalities identified. Musculoskeletal: Severe degenerative changes in the left hip. Degenerative changes in the visualized lumbar spine. IMPRESSION: 1. Evaluation of the colon is limited due to the lack of oral contrast. However, I believe the cecum is thick walled which could be inflammatory or infectious. A neoplastic process is not excluded on today's imaging. Recommend clinical correlation. If the patient has not had a recent colonoscopy, recommend consultation with a gastroenterologist with consideration of a colonoscopy. 2. The appendix is normal in appearance with no appendicitis. 3. Atrophic kidneys with hyperdense cysts. 4. Atherosclerotic changes in the nonaneurysmal aorta. 5. Degenerative changes in the lumbar spine and left hip. Electronically Signed   By: Dorise Bullion III M.D   On: 03/09/2019 00:39    Marzetta Board, MD, PhD [facility]  Contact via  [email or web address]  [place] P: [phone]  F: [phone]

## 2019-03-10 NOTE — Progress Notes (Signed)
[facility] Progress Note   Subjective: Awake, alert, says still having abdominal pain lower quadrants of abd but not as bad.   Objective Vitals:   03/09/19 0900 03/09/19 1642 03/09/19 2039 03/10/19 0458  BP: 105/61 111/68 103/66 115/71  Pulse: 89 87 87 84  Resp: 18 18 18 18   Temp: 99.3 F (37.4 C) 98.4 F (36.9 C) 98.9 F (37.2 C) 98.3 F (36.8 C)  TempSrc: Oral Oral Oral Oral  SpO2: 94% 96% 95% 96%  Weight:   78.6 kg   Height:       Physical Exam General: WN, WD older male in NAD Heart: S1,S2, RRR Lungs: CTAB A/P Abdomen: Tenderness lower quadrants of abd Extremities: No LE edema. Dry scaly skin BLE Dialysis Access: R AVG + bruit   Additional Objective Labs: Basic Metabolic Panel: Recent Labs  Lab 03/05/19 0152 03/06/19 0952 03/08/19 2218 03/09/19 0631  NA 136 138 136 136  K 4.4 4.5 4.4 4.5  CL 93* 93* 89* 88*  CO2 27 26 27 31   GLUCOSE 153* 112* 134* 128*  BUN 41* 88* 33* 39*  CREATININE 8.18* 11.53* 6.80* 7.84*  CALCIUM 9.3 9.2 9.7 9.6  PHOS 4.4 5.8*  --   --    Liver Function Tests: Recent Labs  Lab 03/04/19 0514 03/08/19 2218 03/09/19 0631  AST 30 30 22   ALT 28 26 23   ALKPHOS 116 93 86  BILITOT 0.8 0.7 0.9  PROT 8.3* 8.6* 8.0  ALBUMIN 4.2 4.2 3.9   Recent Labs  Lab 03/08/19 2218  LIPASE 55*   CBC: Recent Labs  Lab 03/04/19 0514  03/05/19 0152 03/08/19 2218 03/09/19 0631  WBC 12.9*  --  8.6 11.7* 13.1*  NEUTROABS 7.1  --   --  9.1*  --   HGB 13.8   < > 12.0* 13.9 12.2*  HCT 44.8   < > 36.6* 42.5 37.8*  MCV 113.1*  --  107.3* 110.4* 107.4*  PLT 294  --  217 205 209   < > = values in this interval not displayed.   Blood Culture    Component Value Date/Time   SDES BLOOD LEFT FOREARM 02/11/2019 0030   SPECREQUEST  02/11/2019 0030    BOTTLES DRAWN AEROBIC AND ANAEROBIC Blood Culture results may not be optimal due to an inadequate volume of blood received in culture bottles   CULT  02/11/2019 0030    NO GROWTH 5  DAYS Performed at [facility], [place] [address] [place], [place] [address]    REPTSTATUS 02/16/2019 FINAL 02/11/2019 0030    Cardiac Enzymes: Recent Labs  Lab 03/04/19 0514  TROPONINI 0.08*   CBG: Recent Labs  Lab 03/05/19 2029 03/05/19 2349 03/06/19 0406 03/06/19 1125 03/06/19 1756  GLUCAP 127* 99 112* 114* 87   Iron Studies: No results for input(s): IRON, TIBC, TRANSFERRIN, FERRITIN in the last 72 hours. @lablastinr3 @ Studies/Results: Ct Abdomen Pelvis Wo Contrast  Result Date: 03/09/2019 CLINICAL DATA:  Right lower quadrant pain. EXAM: CT ABDOMEN AND PELVIS WITHOUT CONTRAST TECHNIQUE: Multidetector CT imaging of the abdomen and pelvis was performed following the standard protocol without IV contrast. COMPARISON:  None. FINDINGS: Lower chest: Cardiomegaly. The lower chest and lungs are otherwise normal. Hepatobiliary: No focal liver abnormality is seen. No gallstones, gallbladder wall thickening, or biliary dilatation. Pancreas: Unremarkable. No pancreatic ductal dilatation or surrounding inflammatory changes. Spleen: Normal in size without focal abnormality. Adrenals/Urinary Tract: The adrenal glands are normal. The kidneys are atrophic without hydronephrosis or  perinephric stranding. No stones are identified. Mass is associated with both kidneys are favored to represent hyperdense cysts. A few lower density cysts are noted. The ureters are normal. The bladder is decompressed but unremarkable. Stomach/Bowel: The stomach and small bowel are normal. Majority of the colon is normal. The cecum appears thick walled as seen on coronal images 51 and 56 as well as axial image 51. There is mild increased attenuation in the fat adjacent to the cecum which is relatively subtle. The appendix is well seen and normal. No evidence of appendicitis. Vascular/Lymphatic: Atherosclerotic changes are seen in the nonaneurysmal aorta. No adenopathy noted. Reproductive: Brachytherapy seeds are seen in the  prostate. Other: No other acute abnormalities identified. Musculoskeletal: Severe degenerative changes in the left hip. Degenerative changes in the visualized lumbar spine. IMPRESSION: 1. Evaluation of the colon is limited due to the lack of oral contrast. However, I believe the cecum is thick walled which could be inflammatory or infectious. A neoplastic process is not excluded on today's imaging. Recommend clinical correlation. If the patient has not had a recent colonoscopy, recommend consultation with a gastroenterologist with consideration of a colonoscopy. 2. The appendix is normal in appearance with no appendicitis. 3. Atrophic kidneys with hyperdense cysts. 4. Atherosclerotic changes in the nonaneurysmal aorta. 5. Degenerative changes in the lumbar spine and left hip. Electronically Signed   By: Dorise Bullion III M.D   On: 03/09/2019 00:39   Medications: . piperacillin-tazobactam (ZOSYN)  IV 3.375 g (03/10/19 0357)   . allopurinol  100 mg Oral Daily  . amiodarone  200 mg Oral Daily  . bictegravir-emtricitabine-tenofovir AF  1 tablet Oral Daily  . brimonidine  1 drop Left Eye BID  . dorzolamide-timolol  1 drop Left Eye BID  . ferric citrate  420 mg Oral TID WC  . latanoprost  1 drop Left Eye QHS  . loratadine  10 mg Oral Daily  . [START ON 03/11/2019] midodrine  10 mg Oral 2 times per day on Tue Thu Sat  . midodrine  5 mg Oral Once per day on Sun Mon Wed Fri  . multivitamin  1 tablet Oral Daily  . [START ON 03/14/2019] Vitamin D (Ergocalciferol)  50,000 Units Oral Q Fri  . warfarin  5 mg Oral ONCE-1800  . Warfarin - Pharmacist Dosing Inpatient   Does not apply q1800     Dialysis Orders:  [facility] 4h 2/2 bath 80.5kg 400/800 P2 RUA AVF  -Hep 5800 units IV TIW -Calcitriol 0.64mcg PO TIW   Assessment/Plan: 1. Acute colitis. CT abd -thick walled cecum. Infectious vs inflammatory etiology. Neoplastic process not excluded. May need GI eval/colonocospy. Empiric antibiotics started  per primary and seems to be improving. Has been advanced to full liquid diet.  2. ESRD - HD TTS. HD tomorrow on schedule. K+ 4.5. Use 2.0 K bath  3. HTN/volume- BP ok/soft. Takes midodrine 10 on HD days. EDW lowered after last admission. Post HD wt 03/08/19 81.8 kg. Try increasing midodrine mid run HD.  4. Anemia-  Hgb 12.2. No ESA needs. Mircera 75 last dosed 3/26 as outpatient.  5. Metabolic Bone Disease- Continue binders/vdra  6. P Afib. On amiodarone, warfarin per pharmacy  7. NICM EF 25% 8. HIV. On ART   Chaia Ikard H. Jaken Fregia NP-C 03/10/2019, 8:55 AM  [employer] [phone]     \

## 2019-03-11 DIAGNOSIS — Z9889 Other specified postprocedural states: Secondary | ICD-10-CM

## 2019-03-11 DIAGNOSIS — R935 Abnormal findings on diagnostic imaging of other abdominal regions, including retroperitoneum: Secondary | ICD-10-CM

## 2019-03-11 DIAGNOSIS — K529 Noninfective gastroenteritis and colitis, unspecified: Secondary | ICD-10-CM

## 2019-03-11 LAB — CBC
HCT: 33.8 % — ABNORMAL LOW (ref 39.0–52.0)
Hemoglobin: 11 g/dL — ABNORMAL LOW (ref 13.0–17.0)
MCH: 34.6 pg — ABNORMAL HIGH (ref 26.0–34.0)
MCHC: 32.5 g/dL (ref 30.0–36.0)
MCV: 106.3 fL — ABNORMAL HIGH (ref 80.0–100.0)
Platelets: 223 10*3/uL (ref 150–400)
RBC: 3.18 MIL/uL — ABNORMAL LOW (ref 4.22–5.81)
RDW: 14.3 % (ref 11.5–15.5)
WBC: 15.7 10*3/uL — ABNORMAL HIGH (ref 4.0–10.5)
nRBC: 0 % (ref 0.0–0.2)

## 2019-03-11 LAB — PROTIME-INR
INR: 4 — ABNORMAL HIGH (ref 0.8–1.2)
Prothrombin Time: 38.7 seconds — ABNORMAL HIGH (ref 11.4–15.2)

## 2019-03-11 LAB — RENAL FUNCTION PANEL
Albumin: 3 g/dL — ABNORMAL LOW (ref 3.5–5.0)
Anion gap: 24 — ABNORMAL HIGH (ref 5–15)
BUN: 92 mg/dL — ABNORMAL HIGH (ref 8–23)
CO2: 24 mmol/L (ref 22–32)
Calcium: 9.4 mg/dL (ref 8.9–10.3)
Chloride: 89 mmol/L — ABNORMAL LOW (ref 98–111)
Creatinine, Ser: 13 mg/dL — ABNORMAL HIGH (ref 0.61–1.24)
GFR calc Af Amer: 4 mL/min — ABNORMAL LOW (ref >60)
GFR calc non Af Amer: 3 mL/min — ABNORMAL LOW (ref >60)
Glucose, Bld: 109 mg/dL — ABNORMAL HIGH (ref 70–99)
Phosphorus: 11.1 mg/dL — ABNORMAL HIGH (ref 2.5–4.6)
Potassium: 5.1 mmol/L (ref 3.5–5.1)
Sodium: 137 mmol/L (ref 135–145)

## 2019-03-11 MED ORDER — HEPARIN SODIUM (PORCINE) 1000 UNIT/ML DIALYSIS: 1000.0000 [IU] | INTRAMUSCULAR | Status: DC | PRN

## 2019-03-11 MED ORDER — SODIUM CHLORIDE 0.9 % IV SOLN: 100.0000 mL | INTRAVENOUS | Status: DC | PRN

## 2019-03-11 MED ORDER — ALTEPLASE 2 MG IJ SOLR: 2.0000 mg | Freq: Once | INTRAMUSCULAR | Status: DC | PRN

## 2019-03-11 MED ORDER — LIDOCAINE-PRILOCAINE 2.5-2.5 % EX CREA: 1.0000 "application " | TOPICAL_CREAM | CUTANEOUS | Status: DC | PRN

## 2019-03-11 MED ORDER — HEPARIN SODIUM (PORCINE) 1000 UNIT/ML DIALYSIS
5800.0000 [IU] | Freq: Once | INTRAMUSCULAR | Status: AC
  Administered 2019-03-11: 07:00:00 5800 [IU] via INTRAVENOUS_CENTRAL

## 2019-03-11 MED ORDER — HEPARIN SODIUM (PORCINE) 1000 UNIT/ML IJ SOLN
INTRAMUSCULAR | Status: AC
  Administered 2019-03-11: 5800 [IU] via INTRAVENOUS_CENTRAL
  Filled 2019-03-11: qty 6

## 2019-03-11 MED ORDER — MIDODRINE HCL 5 MG PO TABS
ORAL_TABLET | ORAL | Status: AC
  Administered 2019-03-11: 10 mg via ORAL
  Filled 2019-03-11: qty 2

## 2019-03-11 MED ORDER — LIDOCAINE HCL (PF) 1 % IJ SOLN: 5.0000 mL | INTRAMUSCULAR | Status: DC | PRN

## 2019-03-11 MED ORDER — PENTAFLUOROPROP-TETRAFLUOROETH EX AERO: 1.0000 "application " | INHALATION_SPRAY | CUTANEOUS | Status: DC | PRN

## 2019-03-11 NOTE — Progress Notes (Signed)
PROGRESS NOTE  David [name] WIO:[id number] DOB: 31-Oct-1941 DOA: 03/08/2019 PCP: [name], NP   LOS: 2 days   Brief Narrative / Interim history: 78 year old male with history of ESRD on HD TTS, history of combined systolic and diastolic CHF, hypertension, CAD, HIV, prostate cancer, OSA who came in with right lower quadrant abdominal pain.  He got his hemodialysis on Saturday 4/4, felt well, went home and all of a sudden experienced right lower quadrant abdominal pain associated with nausea and vomiting.  He denies any diarrhea.  CT scan of the abdomen and pelvis show cecal inflammation/colitis, inflammatory versus infectious and he was admitted to the hospital on broad-spectrum antibiotics  Subjective: -Seen in dialysis this morning, tells me that he is not feeling too good, feels like his right-sided abdominal pain is gotten worse again.  He was better yesterday, no longer had nausea and vomiting but feels worse again today.  Assessment & Plan: Principal Problem:   Colitis presumed infectious Active Problems:   ADENOCARCINOMA, PROSTATE, GLEASON GRADE 3   Hyperlipidemia   Nonischemic dilated cardiomyopathy (HCC)   ESRD on hemodialysis (HCC)   HIV (human immunodeficiency virus infection) (HCC)   Atrial fibrillation, chronic   Chronic anticoagulation   CAD (coronary artery disease)   Gastroesophageal reflux disease   Principal Problem Acute cecal colitis -Patient placed on antibiotics with ciprofloxacin and metronidazole on admission and then transition to Zosyn for which she is has been on in the last couple of days, continue.  Malignancy is also on the differential however his symptoms appeared rather sudden -He was initially improving yesterday, however on my evaluation this morning he continues to have worsening pain, I have consulted gastroenterology, appreciate input.  Of note, his white count is increased from 11-15 in the last couple of days. -He was initially on  a clear liquid diet which was advanced yesterday to full liquids, given worsening of his symptoms will avoid further advancing his diet and awaiting GI input  Active Problems End-stage renal disease -On TTS schedule, dialyzed on Saturday, he looks euvolemic without issues. -Nephrology following, patient undergoing dialysis this morning  Paroxysmal A. fib -Currently in sinus, continue Coumadin per pharmacy, INR this morning 4.0  HIV -Continue home medications  History of prostate cancer -Remission  Nonischemic cardiomyopathy with systolic and diastolic CHF -Most recent 2D echo March 2020 with EF of 20-25%.  Recently hospitalized for fluid overload and CHF exacerbation but that does not seem to be an issue currently. -He appears euvolemic on exam this morning, currently undergoing dialysis  Scheduled Meds: . allopurinol  100 mg Oral Daily  . amiodarone  200 mg Oral Daily  . bictegravir-emtricitabine-tenofovir AF  1 tablet Oral Daily  . brimonidine  1 drop Left Eye BID  . Chlorhexidine Gluconate Cloth  6 each Topical Q0600  . dorzolamide-timolol  1 drop Left Eye BID  . ferric citrate  420 mg Oral TID WC  . latanoprost  1 drop Left Eye QHS  . loratadine  10 mg Oral Daily  . midodrine  10 mg Oral 2 times per day on Tue Thu Sat  . midodrine  5 mg Oral Once per day on Sun Mon Wed Fri  . multivitamin  1 tablet Oral Daily  . [START ON 03/14/2019] Vitamin D (Ergocalciferol)  50,000 Units Oral Q Fri  . Warfarin - Pharmacist Dosing Inpatient   Does not apply q1800   Continuous Infusions: . sodium chloride    . sodium chloride    .  piperacillin-tazobactam (ZOSYN)  IV 3.375 g (03/11/19 0419)   PRN Meds:.sodium chloride, sodium chloride, acetaminophen, alteplase, heparin, HYDROmorphone (DILAUDID) injection, lidocaine (PF), lidocaine-prilocaine, [DISCONTINUED] ondansetron **OR** ondansetron (ZOFRAN) IV, ondansetron (ZOFRAN) IV, pentafluoroprop-tetrafluoroeth  DVT prophylaxis: On  Coumadin Code Status: Full code Family Communication: No family at bedside Disposition Plan: Home when ready  Consultants:   Nephrology  Gastroenterology  Procedures:   None   Antimicrobials:  Ciprofloxacin/metronidazole 4/5   Zosyn 4/5 >>  Objective: Vitals:   03/11/19 0730 03/11/19 0800 03/11/19 0830 03/11/19 0900  BP: (!) 99/59 107/60 109/65 110/65  Pulse: 79 77 84 87  Resp:      Temp:      TempSrc:      SpO2:      Weight:      Height:        Intake/Output Summary (Last 24 hours) at 03/11/2019 0954 Last data filed at 03/11/2019 0857 Gross per 24 hour  Intake 1167 ml  Output 0 ml  Net 1167 ml   Filed Weights   03/09/19 2039 03/10/19 2044 03/11/19 0657  Weight: 78.6 kg 85.9 kg 80 kg    Examination:  Constitutional: No distress, in dialysis Eyes: No scleral icterus ENMT: mmm Respiratory: Clear to auscultation bilaterally, no wheezing or crackles heard. Cardiovascular: Regular rate and rhythm, no peripheral edema Abdomen: Remains tender to palpation in the right lower quadrant, no guarding, no rebound.  Bowel sounds positive Musculoskeletal: no clubbing / cyanosis.  Skin: No rash seen Neurologic: No focal deficits, equal strength Psychiatric: Normal judgment and insight. Alert and oriented x 3. Normal mood.    Data Reviewed: I have independently reviewed following labs and imaging studies   CBC: Recent Labs  Lab 03/04/19 1144 03/05/19 0152 03/08/19 2218 03/09/19 0631 03/11/19 0713  WBC  --  8.6 11.7* 13.1* 15.7*  NEUTROABS  --   --  9.1*  --   --   HGB 12.6* 12.0* 13.9 12.2* 11.0*  HCT 37.0* 36.6* 42.5 37.8* 33.8*  MCV  --  107.3* 110.4* 107.4* 106.3*  PLT  --  217 205 209 263   Basic Metabolic Panel: Recent Labs  Lab 03/05/19 0152 03/06/19 0952 03/08/19 2218 03/09/19 0631 03/11/19 0713  NA 136 138 136 136 137  K 4.4 4.5 4.4 4.5 5.1  CL 93* 93* 89* 88* 89*  CO2 27 26 27 31 24   GLUCOSE 153* 112* 134* 128* 109*  BUN 41* 88* 33* 39*  92*  CREATININE 8.18* 11.53* 6.80* 7.84* 13.00*  CALCIUM 9.3 9.2 9.7 9.6 9.4  MG 2.3 2.4  --   --   --   PHOS 4.4 5.8*  --   --  11.1*   GFR: Estimated Creatinine Clearance: 5.5 mL/min (A) (by C-G formula based on SCr of 13 mg/dL (H)). Liver Function Tests: Recent Labs  Lab 03/08/19 2218 03/09/19 0631 03/11/19 0713  AST 30 22  --   ALT 26 23  --   ALKPHOS 93 86  --   BILITOT 0.7 0.9  --   PROT 8.6* 8.0  --   ALBUMIN 4.2 3.9 3.0*   Recent Labs  Lab 03/08/19 2218  LIPASE 55*   No results for input(s): AMMONIA in the last 168 hours. Coagulation Profile: Recent Labs  Lab 03/06/19 0952 03/08/19 2240 03/09/19 0631 03/10/19 0548 03/11/19 0459  INR 1.8* 1.9* 2.1* 2.7* 4.0*   Cardiac Enzymes: No results for input(s): CKTOTAL, CKMB, CKMBINDEX, TROPONINI in the last 168 hours. BNP (last 3 results) Recent  Labs    03/11/18 1524  PROBNP 230.0*   HbA1C: No results for input(s): HGBA1C in the last 72 hours. CBG: Recent Labs  Lab 03/05/19 2029 03/05/19 2349 03/06/19 0406 03/06/19 1125 03/06/19 1756  GLUCAP 127* 99 112* 114* 87   Lipid Profile: No results for input(s): CHOL, HDL, LDLCALC, TRIG, CHOLHDL, LDLDIRECT in the last 72 hours. Thyroid Function Tests: No results for input(s): TSH, T4TOTAL, FREET4, T3FREE, THYROIDAB in the last 72 hours. Anemia Panel: No results for input(s): VITAMINB12, FOLATE, FERRITIN, TIBC, IRON, RETICCTPCT in the last 72 hours. Urine analysis:    Component Value Date/Time   COLORURINE YELLOW 02/17/2018 0540   APPEARANCEUR CLOUDY (A) 02/17/2018 0540   LABSPEC 1.010 02/17/2018 0540   PHURINE 5.0 02/17/2018 0540   GLUCOSEU NEGATIVE 02/17/2018 0540   HGBUR LARGE (A) 02/17/2018 0540   BILIRUBINUR NEGATIVE 02/17/2018 0540   BILIRUBINUR negative 04/16/2013 1447   KETONESUR NEGATIVE 02/17/2018 0540   PROTEINUR 100 (A) 02/17/2018 0540   UROBILINOGEN 0.2 05/11/2014 1352   NITRITE NEGATIVE 02/17/2018 0540   LEUKOCYTESUR NEGATIVE 02/17/2018  0540   Sepsis Labs: Invalid input(s): PROCALCITONIN, LACTICIDVEN  Recent Results (from the past 240 hour(s))  Respiratory Panel by PCR     Status: None   Collection Time: 03/04/19  8:15 AM  Result Value Ref Range Status   Adenovirus NOT DETECTED NOT DETECTED Final   Coronavirus 229E NOT DETECTED NOT DETECTED Final    Comment: (NOTE) The Coronavirus on the Respiratory Panel, DOES NOT test for the novel  Coronavirus (2019 nCoV)    Coronavirus HKU1 NOT DETECTED NOT DETECTED Final   Coronavirus NL63 NOT DETECTED NOT DETECTED Final   Coronavirus OC43 NOT DETECTED NOT DETECTED Final   Metapneumovirus NOT DETECTED NOT DETECTED Final   Rhinovirus / Enterovirus NOT DETECTED NOT DETECTED Final   Influenza A NOT DETECTED NOT DETECTED Final   Influenza B NOT DETECTED NOT DETECTED Final   Parainfluenza Virus 1 NOT DETECTED NOT DETECTED Final   Parainfluenza Virus 2 NOT DETECTED NOT DETECTED Final   Parainfluenza Virus 3 NOT DETECTED NOT DETECTED Final   Parainfluenza Virus 4 NOT DETECTED NOT DETECTED Final   Respiratory Syncytial Virus NOT DETECTED NOT DETECTED Final   Bordetella pertussis NOT DETECTED NOT DETECTED Final   Chlamydophila pneumoniae NOT DETECTED NOT DETECTED Final   Mycoplasma pneumoniae NOT DETECTED NOT DETECTED Final    Comment: Performed at [facility], [address] [place], [place] [address]      Radiology Studies: No results found.  Marzetta Board, MD, PhD [facility]  Contact via  [email or web address]  [place] P: [phone]  F: [phone]

## 2019-03-11 NOTE — Progress Notes (Signed)
[facility] Progress Note   Subjective: C/Os   Objective Vitals:   03/10/19 0934 03/10/19 2044 03/11/19 0335 03/11/19 0657  BP: 110/62 109/65 112/67 115/68  Pulse: 82 77 80 79  Resp: 18 20 19 18   Temp: 98.6 F (37 C) 98.2 F (36.8 C) 98.1 F (36.7 C) 98.3 F (36.8 C)  TempSrc: Oral Oral Oral Oral  SpO2: 95% 97% 96% 96%  Weight:  85.9 kg  80 kg  Height:       Physical Exam General: WN, WD older male in NAD Heart: S1,S2, RRR Lungs: CTAB A/P Abdomen: Tenderness lower quadrants of abd Extremities: No LE edema. Dry scaly skin BLE Dialysis Access: R AVG cannulated at present  Additional Objective Labs: Basic Metabolic Panel: Recent Labs  Lab 03/05/19 0152 03/06/19 0952 03/08/19 2218 03/09/19 0631 03/11/19 0713  NA 136 138 136 136 137  K 4.4 4.5 4.4 4.5 5.1  CL 93* 93* 89* 88* 89*  CO2 27 26 27 31 24   GLUCOSE 153* 112* 134* 128* 109*  BUN 41* 88* 33* 39* 92*  CREATININE 8.18* 11.53* 6.80* 7.84* 13.00*  CALCIUM 9.3 9.2 9.7 9.6 9.4  PHOS 4.4 5.8*  --   --  11.1*   Liver Function Tests: Recent Labs  Lab 03/08/19 2218 03/09/19 0631 03/11/19 0713  AST 30 22  --   ALT 26 23  --   ALKPHOS 93 86  --   BILITOT 0.7 0.9  --   PROT 8.6* 8.0  --   ALBUMIN 4.2 3.9 3.0*   Recent Labs  Lab 03/08/19 2218  LIPASE 55*   CBC: Recent Labs  Lab 03/05/19 0152 03/08/19 2218 03/09/19 0631 03/11/19 0713  WBC 8.6 11.7* 13.1* 15.7*  NEUTROABS  --  9.1*  --   --   HGB 12.0* 13.9 12.2* 11.0*  HCT 36.6* 42.5 37.8* 33.8*  MCV 107.3* 110.4* 107.4* 106.3*  PLT 217 205 209 223   Blood Culture    Component Value Date/Time   SDES BLOOD LEFT FOREARM 02/11/2019 0030   SPECREQUEST  02/11/2019 0030    BOTTLES DRAWN AEROBIC AND ANAEROBIC Blood Culture results may not be optimal due to an inadequate volume of blood received in culture bottles   CULT  02/11/2019 0030    NO GROWTH 5 DAYS Performed at [facility], [address] [place], [place]  [address]    REPTSTATUS 02/16/2019 FINAL 02/11/2019 0030    Cardiac Enzymes: No results for input(s): CKTOTAL, CKMB, CKMBINDEX, TROPONINI in the last 168 hours. CBG: Recent Labs  Lab 03/05/19 2029 03/05/19 2349 03/06/19 0406 03/06/19 1125 03/06/19 1756  GLUCAP 127* 99 112* 114* 87   Iron Studies: No results for input(s): IRON, TIBC, TRANSFERRIN, FERRITIN in the last 72 hours. @lablastinr3 @ Studies/Results: No results found. Medications: . sodium chloride    . sodium chloride    . piperacillin-tazobactam (ZOSYN)  IV 3.375 g (03/11/19 0419)   . allopurinol  100 mg Oral Daily  . amiodarone  200 mg Oral Daily  . bictegravir-emtricitabine-tenofovir AF  1 tablet Oral Daily  . brimonidine  1 drop Left Eye BID  . Chlorhexidine Gluconate Cloth  6 each Topical Q0600  . dorzolamide-timolol  1 drop Left Eye BID  . ferric citrate  420 mg Oral TID WC  . latanoprost  1 drop Left Eye QHS  . loratadine  10 mg Oral Daily  . midodrine  10 mg Oral 2 times per day on Tue Thu Sat  .  midodrine  5 mg Oral Once per day on Sun Mon Wed Fri  . multivitamin  1 tablet Oral Daily  . [START ON 03/14/2019] Vitamin D (Ergocalciferol)  50,000 Units Oral Q Fri  . Warfarin - Pharmacist Dosing Inpatient   Does not apply q1800     Dialysis Orders: [place] TTS4h 2/2 bath 80.5kg400/800 P2 RUA AVF  -Hep 5800 units IV TIW -Calcitriol 0.9mcg PO TIW  Assessment/Plan: 1.Acute colitis. CT abd -thick walled cecum. Infectious vs inflammatory etiology. Neoplastic process not excluded. May need GI eval/colonocospy. Empiric antibiotics started per primary and seems to be improving. Has been advanced to full liquid diet.  2. ESRD -HD TTS. HD today on schedule. K+ 5.1. Use 2.0 K bath  3. HTN/volume-Under EDW today. Losing wt. Challenge 0.5 liters. Takes midodrine10 on HD days.Has had issues with hypotension at end of HD.  Midodrine 10 mg  mid run HD. Will need lower EDW on DC.  4. Anemia-Hgb 11.0 No ESA  needs. Mircera 75 last dosed 3/26 as outpatient. 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 Binders have been on hold. Resume as soon as he starts eating.Continue VDRA.  6. P Afib. On amiodarone, warfarin per pharmacy  7. NICM EF 25% 8. HIV. On ART  Azaiah Licciardi H. Regino Fournet NP-C 03/11/2019, 8:58 AM  [employer] [phone]

## 2019-03-11 NOTE — Consult Note (Addendum)
[facility] Consult: 10:30 AM 03/11/2019  LOS: 2 days    Referring Provider: Dr Wardell Heath  Primary Care Physician:  [name], NP Primary Gastroenterologist:  Dr. Carlean Purl.      Reason for Consultation:  Abdominal pain, colitis at cecum.     HPI: David [name] is a 78 y.o. male.  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  Chronic Coumadin for Parox Afib.  ESRD on HD TTS.  Anemia chronic dz, on Mircera.  HIV + (CD4 410 in Jan 2020).    01/2011 Colonoscopy.  rioutine risk screening. Removed 3 small polyps.  O/w normal study.  Path: benign lymphoid polyp.  Benign colonic mucosa. Repeat colonoscopy not recommended (letter sent to pt 04/2016)   05/2014 EGD. Dr Teena Irani for N/V, wt loss.  Possible mild gastritis, nothing to explain sxs.    05/2014 Esophagram.  For dysphagia and vomiting. Normal.   05/2014 GES. Abnormal with 74% retention at 60 min and 54% retention at 120 min (normal retention less than 30% at a 120 min).  3 admissions in 2020 for voume overload/CHF/pulm edema.  Latest was  3/31 - 03/06/19. Required ETT/vent for ~ 24 hourafter vomiting in presence of Bipap mask.  Diuresed 3 liters of fluid during HD and CXR 4/1 showed resolved CHF.    Admitted 03/09/19 with RLQ pain.  Rated 9/10 at [facility], started after returning home from HD session.  + nausea, vomiting (no CG or blood). Constipation, no diarrhea.  No fever, chills.  Day 4 abx, Cipro/flagyl for 1 day; day 4 Zosyn. Yesterday he was feeling better and diet was advanced from clears to full liquids.  Although he denies that the full liquids caused increased pain, today he has pain similar to that on arrival.  It is still located in the right lower quadrant.  Relieved with Dilaudid.  Has not had nausea in >36 hours.  Stools are soft, brown.  His Saturday dialysis session was prolonged because of hypotension.  He is on chronic Midodrine for hypotension and dose was increased today. WBCs 11.7 >> 15.7, Hgb 13.9 >> 11 since arrival.  MCV 110.   coags today 38.7/4.   Lipase 55, LFTs normal.   03/09/19 CT ab/pelvis without contrast.    1. Evaluation of the colon is limited due to the lack of oral contrast.  However, I believe the cecum is thick walled which could be inflammatory or infectious. A neoplastic process is not excluded on today's imaging.  2. The appendix is normal in appearance with no appendicitis. 3. Atrophic kidneys with hyperdense cysts. 4. Atherosclerotic changes in the nonaneurysmal aorta. 5. Degenerative changes in the lumbar spine and left hip.  Drinks paul masson wine  Every few weeks.    Past Medical History:  Diagnosis Date  . Acute on chronic systolic and diastolic heart failure, NYHA class 4 ([place])   . Anemia, iron deficiency 11/15/2011  . Arthritis    "hands, right knee, feet" (02/21/2018)  . Atrial fibrillation ([place])   . Cancer ([place])  hx of prostate; s/p radioactive seed implant 10/2009 Dr Janice Norrie  . Cardiac arrest ([place]) 02/17/2018  . CHF (congestive heart failure) ([place])   . Chronic lower back pain   . CKD (chronic kidney disease) stage V requiring chronic dialysis ([place])   . ESRD on dialysis ([place])    started 02/2018  . GERD (gastroesophageal reflux disease)   . Glaucoma   . Gout    daily RX (02/21/2018)  . Heart murmur    "mild" per pt  . Hepatitis    years ago  . History of cardiac cath 2004   negative for CAD  . History of myocardial perfusion scan 02/2010   negative for coronary insufficiency (LVEF 27%)  . HIV (human immunodeficiency virus infection) ([place])   . HIV infection ([place])   . Hyperlipidemia   . Hypertension    followed by [facility] and Vascular (Dr Dani Gobble Croitoru)  . Hypertension   . Nonischemic cardiomyopathy ([place])   . Pneumonia 11/2017  . Prostate cancer ([place])   . Sinus  bradycardia   . Sleep apnea    does not use a cpap  . Stroke [facility])    "mini stroke" years ago  . Systolic and diastolic CHF, acute ([place]) 06/2010   felt to be secondary to hypertensive cardiomyopathy    Past Surgical History:  Procedure Laterality Date  . AV FISTULA PLACEMENT Right 10/13/2016   Procedure: ARTERIOVENOUS (AV) FISTULA CREATION;  Surgeon: [name], MD;  Location: [place];  Service: Vascular;  Laterality: Right;  . AV FISTULA PLACEMENT Right 10/13/2016   [name] [phone]  . AV FISTULA PLACEMENT Left 02/25/2018   Procedure: INSERTION OF ARTERIOVENOUS (AV) GORE-TEX GRAFT LEFT UPPER ARM;  Surgeon: Angelia Mould, MD;  Location: [place];  Service: Vascular;  Laterality: Left;  . AV FISTULA PLACEMENT Right 08/07/2018   Procedure: Creation of right arm brachiocephalic Fistula;  Surgeon: Waynetta Sandy, MD;  Location: [place];  Service: Vascular;  Laterality: Right;  . BASCILIC VEIN TRANSPOSITION Left 06/24/2015   Procedure: BASILIC VEIN TRANSPOSITION;  Surgeon: Mal Misty, MD;  Location: [place];  Service: Vascular;  Laterality: Left;  . BASCILIC VEIN TRANSPOSITION Left 4/53/6468 Bascilic vein transposition (Left)   [name] [phone]  . CARDIAC CATHETERIZATION  02/04/2003   mildly depressed LV systolic fx EF 50%,IBBCWU coronaries/abdominal aorta/renal arteries.  Marland Kitchen CARDIAC CATHETERIZATION  2004  . CATARACT EXTRACTION, BILATERAL Bilateral   . COLONOSCOPY    . ESOPHAGOGASTRODUODENOSCOPY (EGD) WITH PROPOFOL N/A 05/05/2014   Procedure: ESOPHAGOGASTRODUODENOSCOPY (EGD) WITH PROPOFOL;  Surgeon: Missy Sabins, MD;  Location: [facility];  Service: Endoscopy;  Laterality: N/A;  . EYE SURGERY Bilateral    cataract surgery   . EYE SURGERY Bilateral    glaucoma surgery  . EYE SURGERY    . GLAUCOMA SURGERY Bilateral   . INSERTION OF ARTERIOVENOUS (AV) ARTEGRAFT ARM  10/09/2018   Procedure: INSERTION OF ARTERIOVENOUS (AV) 81mm x 41cm ARTEGRAFT LEFT UPPER ARM;  Surgeon: Waynetta Sandy, MD;  Location: [place];  Service: Vascular;;  . INSERTION PROSTATE RADIATION SEED    . IR FLUORO GUIDE CV LINE RIGHT  02/18/2018  . IR REMOVAL TUN CV CATH W/O FL  12/06/2018  . IR US GUIDE VASC ACCESS RIGHT  02/18/2018  . LEFT HEART CATH AND CORONARY ANGIOGRAPHY N/A 02/20/2018   Procedure: LEFT HEART CATH AND CORONARY ANGIOGRAPHY;  Surgeon: Jettie Booze, MD;  Location: [place] CV LAB;  Service: Cardiovascular;  Laterality: N/A;  .  NM MYOCAR PERF WALL MOTION  02/21/2010   normal  . RADIOACTIVE SEED IMPLANT  2010   prostate cancer  . THROMBECTOMY W/ EMBOLECTOMY Left 02/26/2018   Procedure: THROMBECTOMY ARTERIOVENOUS GRAFT;  Surgeon: Angelia Mould, MD;  Location: [place];  Service: Vascular;  Laterality: Left;  . ULTRASOUND GUIDANCE FOR VASCULAR ACCESS  02/20/2018   Procedure: Ultrasound Guidance For Vascular Access;  Surgeon: Jettie Booze, MD;  Location: [place] CV LAB;  Service: Cardiovascular;;  . UPPER EXTREMITY VENOGRAPHY N/A 07/08/2018   Procedure: UPPER EXTREMITY VENOGRAPHY;  Surgeon: Waynetta Sandy, MD;  Location: [place] CV LAB;  Service: Cardiovascular;  Laterality: N/A;  Bilateral  . US ECHOCARDIOGRAPHY  02/06/2012   mild LVH,LA mod. dilated,mild-mod. 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 AI.    Prior to Admission medications   Medication Sig Start Date End Date Taking? Authorizing Provider  acetaminophen (TYLENOL) 500 MG tablet Take 500-1,000 mg by mouth every 6 (six) hours as needed for mild pain.    Yes [provider]  allopurinol (ZYLOPRIM) 100 MG tablet TAKE 1 TABLET EVERY DAY Patient taking differently: Take 100 mg by mouth daily.  12/23/18  Yes Croitoru, Mihai, MD  amiodarone (PACERONE) 200 MG tablet TAKE 1 TABLET(200 MG) BY MOUTH DAILY Patient taking differently: Take 200 mg by mouth daily.  10/29/18  Yes Croitoru, Mihai, MD  AURYXIA 1 GM 210 MG(Fe) tablet Take 420 mg by mouth 3 (three) times  daily with meals.  08/05/18  Yes [provider]  bictegravir-emtricitabine-tenofovir AF ([name]) 50-200-25 MG TABS tablet Take 1 tablet by mouth daily. 01/01/19  Yes Tommy Medal, Lavell Islam, MD  brimonidine (ALPHAGAN) 0.15 % ophthalmic solution Place 1 drop into the left eye 2 (two) times daily. 11/15/17  Yes [provider]  dorzolamide-timolol (COSOPT) 22.3-6.8 MG/ML ophthalmic solution Place 1 drop into the left eye 2 (two) times daily. 12/17/17  Yes [provider]  latanoprost (XALATAN) 0.005 % ophthalmic solution Place 1 drop into the left eye at bedtime.    Yes [provider]  loratadine (CLARITIN) 10 MG tablet Take 1 tablet (10 mg total) by mouth daily. 02/21/19  Yes [name], NP  midodrine (PROAMATINE) 10 MG tablet Take 1 tablet (10 mg total) by mouth as directed. Twice a day on the day of HD Tuesday, Thursday, Saturday (1 in AM and 1 at [place]). Take 1/2 tablet (5mg ) a day on non HD days 03/06/19  Yes Desai, Rahul P, PA-C  multivitamin (RENA-VIT) TABS tablet Take 1 tablet by mouth daily.   Yes [provider]  Vitamin D, Ergocalciferol, (DRISDOL) 50000 UNITS CAPS capsule Take 50,000 Units by mouth every Friday.  04/10/15  Yes [provider]  warfarin (COUMADIN) 5 MG tablet Take 2 tablets by mouth daily or as directed by coumadin clinic Patient taking differently: Take 5 mg by mouth daily.  01/30/19  Yes Croitoru, Mihai, MD    Scheduled Meds: . allopurinol  100 mg Oral Daily  . amiodarone  200 mg Oral Daily  . bictegravir-emtricitabine-tenofovir AF  1 tablet Oral Daily  . brimonidine  1 drop Left Eye BID  . Chlorhexidine Gluconate Cloth  6 each Topical Q0600  . dorzolamide-timolol  1 drop Left Eye BID  . ferric citrate  420 mg Oral TID WC  . latanoprost  1 drop Left Eye QHS  . loratadine  10 mg Oral Daily  . midodrine  10 mg Oral 2 times per day on  Tue Thu Sat  . midodrine  5 mg Oral Once per day on Sun Mon Wed Fri  .  multivitamin  1 tablet Oral Daily  . [START ON 03/14/2019] Vitamin D (Ergocalciferol)  50,000 Units Oral Q Fri  . Warfarin - Pharmacist Dosing Inpatient   Does not apply q1800   Infusions: . sodium chloride    . sodium chloride    . piperacillin-tazobactam (ZOSYN)  IV 3.375 g (03/11/19 0419)   PRN Meds: sodium chloride, sodium chloride, acetaminophen, alteplase, heparin, HYDROmorphone (DILAUDID) injection, lidocaine (PF), lidocaine-prilocaine, [DISCONTINUED] ondansetron **OR** ondansetron (ZOFRAN) IV, ondansetron (ZOFRAN) IV, pentafluoroprop-tetrafluoroeth   Allergies as of 03/08/2019 - Review Complete 03/08/2019  Allergen Reaction Noted  . Cozaar [losartan potassium] Other (See Comments) 07/23/2013    Family History  Problem Relation Age of Onset  . Hypertension Mother   . Thyroid disease Mother   . Cholelithiasis Daughter   . Cholelithiasis Son   . Hypertension Maternal Grandmother   . Diabetes Maternal Grandmother   . Heart attack Neg Hx   . Hyperlipidemia Neg Hx     Social History   Socioeconomic History  . Marital status: Married    Spouse name: Not on file  . Number of children: 2  . Years of education: 52  . Highest education level: Not on file  Occupational History  . Occupation: retired, picks up Aeronautical engineer: RETIRED  Social Needs  . Financial resource strain: Not on file  . Food insecurity:    Worry: Not on file    Inability: Not on file  . Transportation needs:    Medical: Not on file    Non-medical: Not on file  Tobacco Use  . Smoking status: Former Smoker    Packs/day: 1.00    Years: 30.00    Pack years: 30.00    Types: Cigarettes    Last attempt to quit: 2006    Years since quitting: 14.2  . Smokeless tobacco: Never Used  Substance and Sexual Activity  . Alcohol use: Yes    Comment: once a week-wine  . Drug use: Never  . Sexual activity: Not Currently  Lifestyle  . Physical activity:    Days per week: Not on file    Minutes  per session: Not on file  . Stress: Not on file  Relationships  . Social connections:    Talks on phone: Not on file    Gets together: Not on file    Attends religious service: Not on file    Active member of club or organization: Not on file    Attends meetings of clubs or organizations: Not on file    Relationship status: Not on file  . Intimate partner violence:    Fear of current or ex partner: Not on file    Emotionally abused: Not on file    Physically abused: Not on file    Forced sexual activity: Not on file  Other Topics Concern  . Not on file  Social History Narrative   ** Merged History Encounter **       Stays active at home Regular exercise: no Drinks 2 cups of coffee a week, 1 mountain dew soda a day.    REVIEW OF SYSTEMS: Constitutional: Feels weak, unwell. ENT:  No nose bleeds Pulm: No shortness of breath.  No cough. CV:  No palpitations, no LE edema.  No chest pain.  Patient does not get lower extremity edema when he has volume overload. GU:  Oliguric.  Tends to urinate on nondialysis days. GI: See HPI.  Has a good appetite and no reflux symptoms, rarely nauseated. Heme: Denies excessive or unusual bleeding or bruising. Transfusions: Does not recall previous blood transfusions Neuro:  No headaches, no peripheral tingling or numbness Derm:  No itching, no rash or sores.  Endocrine:  No sweats or chills.  No polyuria or dysuria Immunization: Reviewed.  Up-to-date on flu, Pneumovax and multiple other vaccinations. Travel:  None beyond local counties in last few months.    PHYSICAL EXAM: Vital signs in last 24 hours: Vitals:   03/11/19 0930 03/11/19 1000  BP: 112/64 (!) 103/59  Pulse: 87 90  Resp:    Temp:    SpO2:     Wt Readings from Last 3 Encounters:  03/11/19 80 kg  03/06/19 79.8 kg  02/21/19 85.3 kg    General: Pleasant, chronically ill looking AAM seen during HD session. Head: No facial asymmetry or swelling.  No signs of head trauma.  Eyes: EOMI..  No scleral icterus.  No conjunctival pallor. Ears: Not hard of hearing. Nose: No congestion or discharge. Mouth: Pharynx moist, pink, clear.  Tongue midline. Neck: JVD, no masses, no thyromegaly. Lungs: Clear bilaterally.  No cough, no labored breathing. Heart: Currently in NSR.  No MRG.  S1, S2 present. Abdomen: Soft.  Nondistended.  Active bowel sounds.  Tender on the right abdomen upper and lower quadrants.  No guarding or rebound..   Rectal: Deferred Musc/Skeltl: No both joint swelling or deformities Extremities: No CCE.  HD access on right upper arm Neurologic: Alert.  Oriented x3.  Slight, fine tremor in the hands/upper extremity.  Moves all 4 limbs, strength not tested. Skin: Very dry skin especially on the legs.  No open sores.   Psych: Calm, pleasant, cooperative.  Intake/Output from previous day: 04/06 0701 - 04/07 0700 In: 1167 [P.O.:880; NG/GT:237; IV Piggyback:50] Out: 0  Intake/Output this shift: No intake/output data recorded.  LAB RESULTS: Recent Labs    03/08/19 2218 03/09/19 0631 03/11/19 0713  WBC 11.7* 13.1* 15.7*  HGB 13.9 12.2* 11.0*  HCT 42.5 37.8* 33.8*  PLT 205 209 223   BMET Lab Results  Component Value Date   NA 137 03/11/2019   NA 136 03/09/2019   NA 136 03/08/2019   K 5.1 03/11/2019   K 4.5 03/09/2019   K 4.4 03/08/2019   CL 89 (L) 03/11/2019   CL 88 (L) 03/09/2019   CL 89 (L) 03/08/2019   CO2 24 03/11/2019   CO2 31 03/09/2019   CO2 27 03/08/2019   GLUCOSE 109 (H) 03/11/2019   GLUCOSE 128 (H) 03/09/2019   GLUCOSE 134 (H) 03/08/2019   BUN 92 (H) 03/11/2019   BUN 39 (H) 03/09/2019   BUN 33 (H) 03/08/2019   CREATININE 13.00 (H) 03/11/2019   CREATININE 7.84 (H) 03/09/2019   CREATININE 6.80 (H) 03/08/2019   CALCIUM 9.4 03/11/2019   CALCIUM 9.6 03/09/2019   CALCIUM 9.7 03/08/2019   LFT Recent Labs    03/08/19 2218 03/09/19 0631 03/11/19 0713  PROT 8.6* 8.0  --   ALBUMIN 4.2 3.9 3.0*  AST 30 22  --   ALT 26 23   --   ALKPHOS 93 86  --   BILITOT 0.7 0.9  --    PT/INR Lab Results  Component Value Date   INR 4.0 (H) 03/11/2019   INR 2.7 (H) 03/10/2019   INR 2.1 (H) 03/09/2019   Hepatitis Panel No results for input(s):  HEPBSAG, HCVAB, HEPAIGM, HEPBIGM in the last 72 hours. C-Diff No components found for: CDIFF Lipase     Component Value Date/Time   LIPASE 55 (H) 03/08/2019 2218    Drugs of Abuse     Component Value Date/Time   LABOPIA NONE DETECTED 02/17/2018 0540   COCAINSCRNUR NONE DETECTED 02/17/2018 0540   LABBENZ NONE DETECTED 02/17/2018 0540   AMPHETMU NONE DETECTED 02/17/2018 0540   THCU NONE DETECTED 02/17/2018 0540   LABBARB NONE DETECTED 02/17/2018 0540     RADIOLOGY STUDIES: No results found.    IMPRESSION:   *   Cecal inflammation (vs neoplasm) The clinical picture of acute abdominal pain in the setting of hypotension suggests ischemic colitis.  However it is rare to have ischemic colitis in the cecum.  Pain is rare as presenting sx of colon cancer unless there is obstructive element (not present here).  No prodrome of bowel/abdominal issues to suggest Crohns/IBD.   Day 4 Abx, Zosyn, but WBCs rising.  No fevers  *   ESRD.  TTS hemodialysis.    *   Chronic Coumadin for A fib.  INR is supratherapeutic  *   HIV + on meds.  CD4 of 410 in Jan 2020.    *   Chronic macrocytosis, normal B12/Folate 02/2018 when MCV was 100    PLAN:     *  Per Dr Rush Landmark.   *   B12 and Folate in AM.      Azucena Freed  03/11/2019, 10:30 AM Phone [phone]

## 2019-03-11 NOTE — Progress Notes (Signed)
ANTICOAGULATION CONSULT NOTE - Initial Consult  Pharmacy Consult for warfarin Indication: atrial fibrillation  Allergies  Allergen Reactions  . Cozaar [Losartan Potassium] Other (See Comments)    Causes constipation     Patient Measurements: Height: 6\' 2"  (188 cm) Weight: 176 lb 5.9 oz (80 kg) IBW/kg (Calculated) : 82.2   Vital Signs: Temp: 98.3 F (36.8 C) (04/07 0657) Temp Source: Oral (04/07 0657) BP: 115/68 (04/07 0657) Pulse Rate: 79 (04/07 0657)  Labs: Recent Labs    03/08/19 2218  03/09/19 0631 03/10/19 0548 03/11/19 0459 03/11/19 0713  HGB 13.9  --  12.2*  --   --  11.0*  HCT 42.5  --  37.8*  --   --  33.8*  PLT 205  --  209  --   --  223  LABPROT  --    < > 23.1* 28.0* 38.7*  --   INR  --    < > 2.1* 2.7* 4.0*  --   CREATININE 6.80*  --  7.84*  --   --  13.00*   < > = values in this interval not displayed.    Estimated Creatinine Clearance: 5.5 mL/min (A) (by C-G formula based on SCr of 13 mg/dL (H)).   Medical History: Past Medical History:  Diagnosis Date  . Acute on chronic systolic and diastolic heart failure, NYHA class 4 ([place])   . Anemia, iron deficiency 11/15/2011  . Arthritis    "hands, right knee, feet" (02/21/2018)  . Atrial fibrillation ([place])   . Cancer [facility])    hx of prostate; s/p radioactive seed implant 10/2009 Dr Janice Norrie  . Cardiac arrest ([place]) 02/17/2018  . CHF (congestive heart failure) ([place])   . Chronic lower back pain   . CKD (chronic kidney disease) stage V requiring chronic dialysis ([place])   . ESRD on dialysis ([place])    started 02/2018  . GERD (gastroesophageal reflux disease)   . Glaucoma   . Gout    daily RX (02/21/2018)  . Heart murmur    "mild" per pt  . Hepatitis    years ago  . History of cardiac cath 2004   negative for CAD  . History of myocardial perfusion scan 02/2010   negative for coronary insufficiency (LVEF 27%)  . HIV (human immunodeficiency virus infection) ([place])   . HIV infection ([place])   . Hyperlipidemia    . Hypertension    followed by [facility] and Vascular (Dr Dani Gobble Croitoru)  . Hypertension   . Nonischemic cardiomyopathy ([place])   . Pneumonia 11/2017  . Prostate cancer ([place])   . Sinus bradycardia   . Sleep apnea    does not use a cpap  . Stroke [facility])    "mini stroke" years ago  . Systolic and diastolic CHF, acute ([place]) 06/2010   felt to be secondary to hypertensive cardiomyopathy   Assessment: 78 yo male on warfarin 5mg  daily PTA for PAF. INR jumped to 4.0. Hgb 11, plts wnl.  Goal of Therapy:  INR 2-3 Monitor platelets by anticoagulation protocol: Yes   Plan:  Hold Coumadin Monitor daily INR, CBC, s/s of bleed  Elenor Quinones, PharmD, BCPS, BCIDP Clinical Pharmacist 03/11/2019 8:08 AM

## 2019-03-12 ENCOUNTER — Telehealth: Payer: Self-pay

## 2019-03-12 ENCOUNTER — Other Ambulatory Visit: Payer: Medicare HMO

## 2019-03-12 DIAGNOSIS — I420 Dilated cardiomyopathy: Secondary | ICD-10-CM

## 2019-03-12 DIAGNOSIS — R1031 Right lower quadrant pain: Secondary | ICD-10-CM

## 2019-03-12 DIAGNOSIS — Z21 Asymptomatic human immunodeficiency virus [HIV] infection status: Secondary | ICD-10-CM

## 2019-03-12 DIAGNOSIS — R1032 Left lower quadrant pain: Secondary | ICD-10-CM

## 2019-03-12 LAB — BASIC METABOLIC PANEL
Anion gap: 17 — ABNORMAL HIGH (ref 5–15)
BUN: 43 mg/dL — ABNORMAL HIGH (ref 8–23)
CO2: 26 mmol/L (ref 22–32)
Calcium: 9.2 mg/dL (ref 8.9–10.3)
Chloride: 94 mmol/L — ABNORMAL LOW (ref 98–111)
Creatinine, Ser: 8.34 mg/dL — ABNORMAL HIGH (ref 0.61–1.24)
GFR calc Af Amer: 6 mL/min — ABNORMAL LOW (ref >60)
GFR calc non Af Amer: 6 mL/min — ABNORMAL LOW (ref >60)
Glucose, Bld: 115 mg/dL — ABNORMAL HIGH (ref 70–99)
Potassium: 4.2 mmol/L (ref 3.5–5.1)
Sodium: 137 mmol/L (ref 135–145)

## 2019-03-12 LAB — PROTIME-INR
INR: 4.5 (ref 0.8–1.2)
Prothrombin Time: 41.9 seconds — ABNORMAL HIGH (ref 11.4–15.2)

## 2019-03-12 LAB — CBC
HCT: 35.8 % — ABNORMAL LOW (ref 39.0–52.0)
Hemoglobin: 11.4 g/dL — ABNORMAL LOW (ref 13.0–17.0)
MCH: 33.9 pg (ref 26.0–34.0)
MCHC: 31.8 g/dL (ref 30.0–36.0)
MCV: 106.5 fL — ABNORMAL HIGH (ref 80.0–100.0)
Platelets: 276 10*3/uL (ref 150–400)
RBC: 3.36 MIL/uL — ABNORMAL LOW (ref 4.22–5.81)
RDW: 14.5 % (ref 11.5–15.5)
WBC: 11.3 10*3/uL — ABNORMAL HIGH (ref 4.0–10.5)
nRBC: 0 % (ref 0.0–0.2)

## 2019-03-12 LAB — VITAMIN B12: Vitamin B-12: 1126 pg/mL — ABNORMAL HIGH (ref 180–914)

## 2019-03-12 MED ORDER — PHYTONADIONE 1 MG/0.5 ML ORAL SOLUTION: 1.0000 mg | Freq: Once | ORAL | Status: DC

## 2019-03-12 MED ORDER — BISACODYL 5 MG PO TBEC
20.0000 mg | DELAYED_RELEASE_TABLET | Freq: Once | ORAL | Status: AC
  Administered 2019-03-13: 20 mg via ORAL
  Filled 2019-03-12: qty 4

## 2019-03-12 MED ORDER — PHYTONADIONE 1 MG/0.5 ML ORAL SOLUTION
1.0000 mg | Freq: Once | ORAL | Status: AC
  Administered 2019-03-12: 1 mg via ORAL
  Filled 2019-03-12: qty 0.5

## 2019-03-12 MED ORDER — METOCLOPRAMIDE HCL 5 MG/ML IJ SOLN
5.0000 mg | Freq: Once | INTRAMUSCULAR | Status: AC
  Administered 2019-03-13: 5 mg via INTRAVENOUS
  Filled 2019-03-12: qty 2

## 2019-03-12 MED ORDER — PEG-KCL-NACL-NASULF-NA ASC-C 100 G PO SOLR
1.0000 | Freq: Once | ORAL | Status: AC
  Administered 2019-03-13: 200 g via ORAL
  Filled 2019-03-12: qty 1

## 2019-03-12 MED ORDER — BISACODYL 5 MG PO TBEC
5.0000 mg | DELAYED_RELEASE_TABLET | Freq: Once | ORAL | Status: AC
  Administered 2019-03-12: 5 mg via ORAL
  Filled 2019-03-12: qty 1

## 2019-03-12 NOTE — Progress Notes (Signed)
CRITICAL VALUE ALERT  Critical Value:  INR - 4.5  Date & Time Notified: 03/12/2019 at [place]  Provider Notified: Baltazar Najjar, NP  Orders Received/Actions taken : awaiting for orders.

## 2019-03-12 NOTE — Telephone Encounter (Signed)
[facility] Follow Up call made again to patient. Left message for return call.

## 2019-03-12 NOTE — Progress Notes (Signed)
[facility] PROGRESS NOTE    Progress Note  [facility]  FTD:[id number] DOB: 05-01-1941 DOA: 03/08/2019 PCP: [name], NP     Brief Narrative:   David [name] is an 78 y.o. 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 Thursdays and Saturdays, history of combined systolic and diastolic heart failure, HIV prostate cancer who came in with a right lower quadrant abdominal pain seated with nausea and vomiting, CT scan of the abdomen and pelvis shows cecal inflammation with colitis, admitted to the hospital started on broad-spectrum antibiotics  Assessment/Plan:   Acute Colitis presumed infectious Patient started empirically on Cipro and Flagyl, due to the minimal improvement transition to IV Zosyn. He was placed n.p.o., GI was consulted he is scheduled for possible colonoscopy on 03/12/2019 if INR permits. Give a dose of vitamin K today, recheck an INR tomorrow morning.  Chronic atrial fibrillation: His Coumadin was held on 03/10/2019, is currently going up, today is 4.5. I will give him a small dose of vitamin K recheck his INR tomorrow morning. Currently rate controlled.  End-stage renal disease: Continue dialysis per renal. Appears euvolemic.    HIV: Continue current home medications.  History of ADENOCARCINOMA, PROSTATE, GLEASON GRADE 3 Follow-up with oncology as an outpatient.  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   DVT prophylaxis: coumadin Family Communication:none Disposition Plan/Barrier to D/C: once abd pain improved Code Status:     Code Status Orders  (From admission, onward)         Start     Ordered   03/09/19 0339  Full code  Continuous     03/09/19 0338        Code Status History    Date Active Date Inactive Code Status Order ID Comments User Context   03/04/2019 0735 03/06/2019 2321 Full Code [id number]  Roxanne Mins, MD ED   02/11/2019 0203 02/12/2019 1524 Full Code [id number]  Vianne Bulls, MD ED   12/05/2018 0033 12/06/2018 2016 Full Code [id number]  Rise Patience, MD ED   11/01/2018 0918 11/02/2018 1340 Full Code [id number]  Karmen Bongo, MD ED   10/21/2018 2316 10/23/2018 1508 Full Code [id number]  Ivor Costa, MD ED   02/17/2018 0434 03/09/2018 1851 Full Code [id number]  Omar Person, NP ED   01/21/2018 0405 01/22/2018 1802 Full Code [id number]  Vianne Bulls, MD ED   11/24/2017 1934 12/01/2017 1621 Full Code [id number]  Omar Person, NP Inpatient   11/23/2017 0004 11/24/2017 1934 Full Code [id number]  Vianne Bulls, MD ED   05/12/2014 2022 05/15/2014 2106 Full Code [id number]  Marybelle Killings, MD Inpatient   05/02/2014 1642 05/12/2014 2022 Full Code [id number]  Geradine Girt, DO Inpatient        IV Access:    Peripheral IV   Procedures and diagnostic studies:   No results found.   Medical Consultants:    None.  Anti-Infectives:   None  Subjective:    [facility] no complain  Objective:    Vitals:   03/11/19 1704 03/11/19 2120 03/12/19 0456 03/12/19 0907  BP: 110/69 92/70 100/60 103/60  Pulse: 71 90 76 79  Resp: 18 16 20 20   Temp: 97.6 F (36.4 C) 99.2 F (37.3 C) 98.1 F (36.7 C) 98.2 F (36.8 C)  TempSrc: Oral Oral Oral Oral  SpO2: 100% 98% 96% 97%  Weight:  79.3 kg  Height:        Intake/Output Summary (Last 24 hours) at 03/12/2019 0958 Last data filed at 03/12/2019 0951 Gross per 24 hour  Intake 1160 ml  Output 550 ml  Net 610 ml   Filed Weights   03/11/19 0657 03/11/19 1105 03/11/19 2120  Weight: 80 kg 79.3 kg 79.3 kg    Exam: General exam: In no acute distress. Respiratory system: Good air movement and clear to auscultation. Cardiovascular system: S1 & S2 heard, RRR. Gastrointestinal system: Abdomen is nondistended, soft and nontender.  Central nervous system: Alert and oriented. No focal neurological deficits. Extremities:  No pedal edema. Skin: No rashes, lesions or ulcers Psychiatry: Judgement and insight appear normal. Mood & affect appropriate.    Data Reviewed:    Labs: Basic Metabolic Panel: Recent Labs  Lab 03/06/19 0952 03/08/19 2218 03/09/19 0631 03/11/19 0713 03/12/19 0358  NA 138 136 136 137 137  K 4.5 4.4 4.5 5.1 4.2  CL 93* 89* 88* 89* 94*  CO2 26 27 31 24 26   GLUCOSE 112* 134* 128* 109* 115*  BUN 88* 33* 39* 92* 43*  CREATININE 11.53* 6.80* 7.84* 13.00* 8.34*  CALCIUM 9.2 9.7 9.6 9.4 9.2  MG 2.4  --   --   --   --   PHOS 5.8*  --   --  11.1*  --    GFR Estimated Creatinine Clearance: 8.5 mL/min (A) (by C-G formula based on SCr of 8.34 mg/dL (H)). Liver Function Tests: Recent Labs  Lab 03/08/19 2218 03/09/19 0631 03/11/19 0713  AST 30 22  --   ALT 26 23  --   ALKPHOS 93 86  --   BILITOT 0.7 0.9  --   PROT 8.6* 8.0  --   ALBUMIN 4.2 3.9 3.0*   Recent Labs  Lab 03/08/19 2218  LIPASE 55*   No results for input(s): AMMONIA in the last 168 hours. Coagulation profile Recent Labs  Lab 03/08/19 2240 03/09/19 0631 03/10/19 0548 03/11/19 0459 03/12/19 0358  INR 1.9* 2.1* 2.7* 4.0* 4.5*    CBC: Recent Labs  Lab 03/08/19 2218 03/09/19 0631 03/11/19 0713 03/12/19 0358  WBC 11.7* 13.1* 15.7* 11.3*  NEUTROABS 9.1*  --   --   --   HGB 13.9 12.2* 11.0* 11.4*  HCT 42.5 37.8* 33.8* 35.8*  MCV 110.4* 107.4* 106.3* 106.5*  PLT 205 209 223 276   Cardiac Enzymes: No results for input(s): CKTOTAL, CKMB, CKMBINDEX, TROPONINI in the last 168 hours. BNP (last 3 results) No results for input(s): PROBNP in the last 8760 hours. CBG: Recent Labs  Lab 03/05/19 2029 03/05/19 2349 03/06/19 0406 03/06/19 1125 03/06/19 1756  GLUCAP 127* 99 112* 114* 87   D-Dimer: No results for input(s): DDIMER in the last 72 hours. Hgb A1c: No results for input(s): HGBA1C in the last 72 hours. Lipid Profile: No results for input(s): CHOL, HDL, LDLCALC, TRIG, CHOLHDL, LDLDIRECT in  the last 72 hours. Thyroid function studies: No results for input(s): TSH, T4TOTAL, T3FREE, THYROIDAB in the last 72 hours.  Invalid input(s): FREET3 Anemia work up: Recent Labs    03/12/19 [place] 1,126*   Sepsis Labs: Recent Labs  Lab 03/08/19 2218 03/09/19 0631 03/11/19 0713 03/12/19 0358  WBC 11.7* 13.1* 15.7* 11.3*   Microbiology Recent Results (from the past 240 hour(s))  Respiratory Panel by PCR     Status: None   Collection Time: 03/04/19  8:15 AM  Result Value Ref Range Status   Adenovirus NOT  DETECTED NOT DETECTED Final   Coronavirus 229E NOT DETECTED NOT DETECTED Final    Comment: (NOTE) The Coronavirus on the Respiratory Panel, DOES NOT test for the novel  Coronavirus (2019 nCoV)    Coronavirus HKU1 NOT DETECTED NOT DETECTED Final   Coronavirus NL63 NOT DETECTED NOT DETECTED Final   Coronavirus OC43 NOT DETECTED NOT DETECTED Final   Metapneumovirus NOT DETECTED NOT DETECTED Final   Rhinovirus / Enterovirus NOT DETECTED NOT DETECTED Final   Influenza A NOT DETECTED NOT DETECTED Final   Influenza B NOT DETECTED NOT DETECTED Final   Parainfluenza Virus 1 NOT DETECTED NOT DETECTED Final   Parainfluenza Virus 2 NOT DETECTED NOT DETECTED Final   Parainfluenza Virus 3 NOT DETECTED NOT DETECTED Final   Parainfluenza Virus 4 NOT DETECTED NOT DETECTED Final   Respiratory Syncytial Virus NOT DETECTED NOT DETECTED Final   Bordetella pertussis NOT DETECTED NOT DETECTED Final   Chlamydophila pneumoniae NOT DETECTED NOT DETECTED Final   Mycoplasma pneumoniae NOT DETECTED NOT DETECTED Final    Comment: Performed at [facility], [place] [address], [place], [place] [address]     Medications:   . allopurinol  100 mg Oral Daily  . amiodarone  200 mg Oral Daily  . bictegravir-emtricitabine-tenofovir AF  1 tablet Oral Daily  . brimonidine  1 drop Left Eye BID  . Chlorhexidine Gluconate Cloth  6 each Topical Q0600  . dorzolamide-timolol  1 drop Left Eye BID   . ferric citrate  420 mg Oral TID WC  . latanoprost  1 drop Left Eye QHS  . loratadine  10 mg Oral Daily  . midodrine  10 mg Oral 2 times per day on Tue Thu Sat  . midodrine  5 mg Oral Once per day on Sun Mon Wed Fri  . multivitamin  1 tablet Oral Daily  . [START ON 03/14/2019] Vitamin D (Ergocalciferol)  50,000 Units Oral Q Fri  . Warfarin - Pharmacist Dosing Inpatient   Does not apply q1800   Continuous Infusions: . piperacillin-tazobactam (ZOSYN)  IV 3.375 g (03/12/19 0423)      LOS: 3 days   Charlynne Cousins  [facility]  03/12/2019, 9:58 AM

## 2019-03-12 NOTE — Progress Notes (Addendum)
Daily Rounding Note  03/12/2019, 11:10 AM  LOS: 3 days   SUBJECTIVE:   Chief complaint: Cecal inflammation.  Right upper quadrant abdominal pain.    Pain is not quite as severe but it persists.  He is tender to palpation and it hurts when he gets jostled.  OBJECTIVE:         Vital signs in last 24 hours:    Temp:  [97.6 F (36.4 C)-99.2 F (37.3 C)] 98.2 F (36.8 C) (04/08 0907) Pulse Rate:  [71-90] 79 (04/08 0907) Resp:  [16-20] 20 (04/08 0907) BP: (92-110)/(60-70) 103/60 (04/08 0907) SpO2:  [96 %-100 %] 97 % (04/08 0907) Weight:  [79.3 kg] 79.3 kg (04/07 2120) Last BM Date: 03/11/19 Filed Weights   03/11/19 0657 03/11/19 1105 03/11/19 2120  Weight: 80 kg 79.3 kg 79.3 kg   General: Looks somewhat chronically ill.  Sleeping but easily awakened. Heart: RRR. Chest: Clear bilaterally.  No labored breathing. Abdomen: Soft.  Slight right upper quadrant tenderness without guarding or rebound. Extremities: No CCE. Neuro/Psych: Alert.  Oriented x3.  Moves all 4 limbs.  No gross deficits.  Intake/Output from previous day: 04/07 0701 - 04/08 0700 In: 1060 [P.O.:1060] Out: 550 [Urine:50]  Intake/Output this shift: Total I/O In: 100 [P.O.:100] Out: -   Lab Results: Recent Labs    03/11/19 0713 03/12/19 0358  WBC 15.7* 11.3*  HGB 11.0* 11.4*  HCT 33.8* 35.8*  PLT 223 276   BMET Recent Labs    03/11/19 0713 03/12/19 0358  NA 137 137  K 5.1 4.2  CL 89* 94*  CO2 24 26  GLUCOSE 109* 115*  BUN 92* 43*  CREATININE 13.00* 8.34*  CALCIUM 9.4 9.2   LFT Recent Labs    03/11/19 0713  ALBUMIN 3.0*   PT/INR Recent Labs    03/11/19 0459 03/12/19 0358  LABPROT 38.7* 41.9*  INR 4.0* 4.5*   Hepatitis Panel No results for input(s): HEPBSAG, HCVAB, HEPAIGM, HEPBIGM in the last 72 hours.  Studies/Results: No results found.   Scheduled Meds: . allopurinol  100 mg Oral Daily  . amiodarone  200 mg Oral  Daily  . bictegravir-emtricitabine-tenofovir AF  1 tablet Oral Daily  . brimonidine  1 drop Left Eye BID  . Chlorhexidine Gluconate Cloth  6 each Topical Q0600  . dorzolamide-timolol  1 drop Left Eye BID  . ferric citrate  420 mg Oral TID WC  . latanoprost  1 drop Left Eye QHS  . loratadine  10 mg Oral Daily  . midodrine  10 mg Oral 2 times per day on Tue Thu Sat  . midodrine  5 mg Oral Once per day on Sun Mon Wed Fri  . multivitamin  1 tablet Oral Daily  . phytonadione  1 mg Oral Once  . [START ON 03/14/2019] Vitamin D (Ergocalciferol)  50,000 Units Oral Q Fri   Continuous Infusions: . piperacillin-tazobactam (ZOSYN)  IV 3.375 g (03/12/19 0423)   PRN Meds:.acetaminophen, HYDROmorphone (DILAUDID) injection, [DISCONTINUED] ondansetron **OR** ondansetron (ZOFRAN) IV, ondansetron (ZOFRAN) IV   ASSESMENT:   *   Cecal inflammation versus neoplasm. Atypical location for ischemic colitis. Day 4 Zosyn, WBCs improved.  No fever.    *   ESRD.  TTS dialysis.    *   Chronic hypotension.    *     Chronic Coumadin for A. fib.  INR further elevated.  Suspect concurrent antibiotics contributing to this. Note order for po VIT  K, to be given this AM.    *    HIV positive.  *    Chronic macrocytosis.  Minor anemia.  On Auryxia.     PLAN   *   INR too high for colonoscopy. Set date for 4/10.     Leave on fulls, clears start in AM.           Azucena Freed  03/12/2019, 11:10 AM Phone [phone]

## 2019-03-12 NOTE — Consult Note (Addendum)
   [facility] CM Inpatient Consult   03/12/2019  David [name] Jan 11, 1941 [id number]    Patient was screened for potential needs for [facility] Care Management services under his [facility] plan with extreme risk score (46%) for unplanned readmission and hospitalization. Noted readmission in 7 days and 30 days. (Not on APL roster but has [facility] banner and [facility] provider).  Per chart review and history & physical on 03/09/19 reveal as follows: [name] is a 78 y.o. male with medical history significant of end-stage renal disease on hemodialysis on Tuesdays-Thursdays and Saturdays ([facility]), history of diastolic CHF, hypertension, GERD, coronary artery disease, HIV disease (followed by ID), prostate cancer, obstructive sleep apnea, who came to the ER after his hemodialysis complaining of right lower quadrant abdominal pain, nausea/ vomiting (acute cecal colitis presumed infectious).  Primary care provider is David Alar, NP with [facility] with whom he has frequent contact and follow-up with.  Spoke with patient over the phone to identify possible discharge needs but current disposition is still to be determined and patient verbalized hopes to go home. Patient denies any barriers for medications/ pharmacy, transportation, caregiver and he verbalized no current needs at this point. He states that wife David [name]) and his step-son provides support with his needs at home.  Patient indicates he has enough medical care to support him.  Will follow disposition. If patient's post hospital needs change, please place a [facility] Care Management consult for community follow-up as appropriate.   Of note, [facility] Care Management services does not replace or interfere with any services that are arranged by transition of care case management or social work.    For questions and referral, please contact:  Lennis Rader A. Oriel Ojo, BSN, RN-BC [facility] Liaison Cell: [phone]

## 2019-03-12 NOTE — Telephone Encounter (Signed)
Having problems reaching patient to [facility] follow up.

## 2019-03-12 NOTE — Progress Notes (Signed)
ANTICOAGULATION CONSULT NOTE - Follow Up Consult  Pharmacy Consult for Coumadin and Zosyn Indication: afib and IAI  Allergies  Allergen Reactions  . Cozaar [Losartan Potassium] Other (See Comments)    Causes constipation     Patient Measurements: Height: 6\' 2"  (188 cm) Weight: 174 lb 13.2 oz (79.3 kg) IBW/kg (Calculated) : 82.2   Vital Signs: Temp: 98.1 F (36.7 C) (04/08 0456) Temp Source: Oral (04/08 0456) BP: 100/60 (04/08 0456) Pulse Rate: 76 (04/08 0456)  Labs: Recent Labs    03/10/19 0548 03/11/19 0459 03/11/19 0713 03/12/19 0358  HGB  --   --  11.0* 11.4*  HCT  --   --  33.8* 35.8*  PLT  --   --  223 276  LABPROT 28.0* 38.7*  --  41.9*  INR 2.7* 4.0*  --  4.5*  CREATININE  --   --  13.00* 8.34*    Estimated Creatinine Clearance: 8.5 mL/min (A) (by C-G formula based on SCr of 8.34 mg/dL (H)).  Assessment: Anticoag: Afib on warfarin PTA. INR 4>4.5. Hgb 11.4 up. Plts 276 up. - Warfarin 5mg  daily PTA  ID: Day #4 of Zosyn for IAI and [name] for HIV. Afebrile, WBC 15.7>11.3 down. No cultures  Zosyn 4/5 >> Cipro x 1 Flagyl x 1  Goal of Therapy:  INR 2-3 Monitor platelets by anticoagulation protocol: Yes   Plan:  Hold Coumadin Monitor daily INR, CBC, s/s of bleed Zosyn 3.375 gm IV q12 GI to reeval for colonoscopy on 4/9.   Rivkah Wolz S. Alford Highland, PharmD, BCPS Clinical Staff Pharmacist Eilene Ghazi Stillinger 03/12/2019,8:22 AM

## 2019-03-12 NOTE — Progress Notes (Addendum)
[facility] Progress Note   Subjective: Says he felt better during HD yesterday and feels better today. Did not have abdominal pain during HD yesterday.      Objective Vitals:   03/11/19 1105 03/11/19 1704 03/11/19 2120 03/12/19 0456  BP: 106/62 110/69 92/70 100/60  Pulse: 89 71 90 76  Resp: 18 18 16 20   Temp: 98.1 F (36.7 C) 97.6 F (36.4 C) 99.2 F (37.3 C) 98.1 F (36.7 C)  TempSrc: Oral Oral Oral Oral  SpO2: 96% 100% 98% 96%  Weight: 79.3 kg  79.3 kg   Height:       Physical Exam General:WN, WD older male in NAD Heart:S1,S2, RRR Lungs:CTAB A/P Abdomen:Tenderness lower quadrants of abd Extremities:No LE edema. Dry scaly skin BLE Dialysis Access:R AVG + bruit   Additional Objective Labs: Basic Metabolic Panel: Recent Labs  Lab 03/06/19 0952  03/09/19 0631 03/11/19 0713 03/12/19 0358  NA 138   < > 136 137 137  K 4.5   < > 4.5 5.1 4.2  CL 93*   < > 88* 89* 94*  CO2 26   < > 31 24 26   GLUCOSE 112*   < > 128* 109* 115*  BUN 88*   < > 39* 92* 43*  CREATININE 11.53*   < > 7.84* 13.00* 8.34*  CALCIUM 9.2   < > 9.6 9.4 9.2  PHOS 5.8*  --   --  11.1*  --    < > = values in this interval not displayed.   Liver Function Tests: Recent Labs  Lab 03/08/19 2218 03/09/19 0631 03/11/19 0713  AST 30 22  --   ALT 26 23  --   ALKPHOS 93 86  --   BILITOT 0.7 0.9  --   PROT 8.6* 8.0  --   ALBUMIN 4.2 3.9 3.0*   Recent Labs  Lab 03/08/19 2218  LIPASE 55*   CBC: Recent Labs  Lab 03/08/19 2218 03/09/19 0631 03/11/19 0713 03/12/19 0358  WBC 11.7* 13.1* 15.7* 11.3*  NEUTROABS 9.1*  --   --   --   HGB 13.9 12.2* 11.0* 11.4*  HCT 42.5 37.8* 33.8* 35.8*  MCV 110.4* 107.4* 106.3* 106.5*  PLT 205 209 223 276   Blood Culture    Component Value Date/Time   SDES BLOOD LEFT FOREARM 02/11/2019 0030   SPECREQUEST  02/11/2019 0030    BOTTLES DRAWN AEROBIC AND ANAEROBIC Blood Culture results may not be optimal due to an inadequate volume of  blood received in culture bottles   CULT  02/11/2019 0030    NO GROWTH 5 DAYS Performed at [facility] [address], [place], [place] [address]    REPTSTATUS 02/16/2019 FINAL 02/11/2019 0030    Cardiac Enzymes: No results for input(s): CKTOTAL, CKMB, CKMBINDEX, TROPONINI in the last 168 hours. CBG: Recent Labs  Lab 03/05/19 2029 03/05/19 2349 03/06/19 0406 03/06/19 1125 03/06/19 1756  GLUCAP 127* 99 112* 114* 87   Iron Studies: No results for input(s): IRON, TIBC, TRANSFERRIN, FERRITIN in the last 72 hours. @lablastinr3 @ Studies/Results: No results found. Medications: . piperacillin-tazobactam (ZOSYN)  IV 3.375 g (03/12/19 0423)   . allopurinol  100 mg Oral Daily  . amiodarone  200 mg Oral Daily  . bictegravir-emtricitabine-tenofovir AF  1 tablet Oral Daily  . brimonidine  1 drop Left Eye BID  . Chlorhexidine Gluconate Cloth  6 each Topical Q0600  . dorzolamide-timolol  1 drop Left Eye BID  . ferric citrate  420 mg Oral TID WC  . latanoprost  1 drop Left Eye QHS  . loratadine  10 mg Oral Daily  . midodrine  10 mg Oral 2 times per day on Tue Thu Sat  . midodrine  5 mg Oral Once per day on Sun Mon Wed Fri  . multivitamin  1 tablet Oral Daily  . [START ON 03/14/2019] Vitamin D (Ergocalciferol)  50,000 Units Oral Q Fri  . Warfarin - Pharmacist Dosing Inpatient   Does not apply q1800     Dialysis Orders: [facility] TTS4h 2/2 bath 80.5kg400/800 P2 RUA AVG -Hep 5800units IV TIW -Calcitriol 0.58mcg PO TIW  Assessment/Plan: 1.Acute colitis. CT abd -thick walled cecum. Infectious vs inflammatory etiology. Neoplastic process not excluded. Initially started to improve on ABX but now feeling worse. Seen by GI yesterday. Continue Zosyn. WBC 11.3.  2. ESRD -HD TTS.HD tomorrow on schedule. K+ 4.2.  3. HTN/volume-BP on lower side today. HD 03/11/19 Pre wt 80 kg Net UF 500 Post wt 79.3 kg.  Takes midodrine10 on HD days.Has had issues with hypotension at end of  HD.  Midodrine 10 mg  mid run HD.Will need lower EDW on DC. Minimal volume removal with HD tomorrow.  4. Anemia-Hgb 11.4. No ESA needs. Mircera 75 last dosed 3/26 as outpatient. 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 Binders have been on hold. Resume as soon as he starts eating.Continue VDRA.  6. P Afib. On amiodarone, warfarin per pharmacy  7. NICM EF 25% 8. HIV. On ART 9. Nutrition: Albumin 3.0. Full liquids. Add prostat when eating.    H.  NP-C 03/12/2019, 9:05 AM  [employer] [phone]

## 2019-03-13 ENCOUNTER — Telehealth: Payer: Self-pay

## 2019-03-13 LAB — FOLATE RBC
Folate, Hemolysate: >620 ng/mL
Folate, RBC: >1808 ng/mL (ref >498)
Hematocrit: 34.3 % — ABNORMAL LOW (ref 37.5–51.0)

## 2019-03-13 LAB — RENAL FUNCTION PANEL
Albumin: 2.9 g/dL — ABNORMAL LOW (ref 3.5–5.0)
Anion gap: 16 — ABNORMAL HIGH (ref 5–15)
BUN: 27 mg/dL — ABNORMAL HIGH (ref 8–23)
CO2: 26 mmol/L (ref 22–32)
Calcium: 8.8 mg/dL — ABNORMAL LOW (ref 8.9–10.3)
Chloride: 95 mmol/L — ABNORMAL LOW (ref 98–111)
Creatinine, Ser: 6.54 mg/dL — ABNORMAL HIGH (ref 0.61–1.24)
GFR calc Af Amer: 9 mL/min — ABNORMAL LOW (ref >60)
GFR calc non Af Amer: 8 mL/min — ABNORMAL LOW (ref >60)
Glucose, Bld: 105 mg/dL — ABNORMAL HIGH (ref 70–99)
Phosphorus: 5.9 mg/dL — ABNORMAL HIGH (ref 2.5–4.6)
Potassium: 3.7 mmol/L (ref 3.5–5.1)
Sodium: 137 mmol/L (ref 135–145)

## 2019-03-13 LAB — CBC
HCT: 36 % — ABNORMAL LOW (ref 39.0–52.0)
Hemoglobin: 11.7 g/dL — ABNORMAL LOW (ref 13.0–17.0)
MCH: 34.6 pg — ABNORMAL HIGH (ref 26.0–34.0)
MCHC: 32.5 g/dL (ref 30.0–36.0)
MCV: 106.5 fL — ABNORMAL HIGH (ref 80.0–100.0)
Platelets: 302 10*3/uL (ref 150–400)
RBC: 3.38 MIL/uL — ABNORMAL LOW (ref 4.22–5.81)
RDW: 14.3 % (ref 11.5–15.5)
WBC: 9.1 10*3/uL (ref 4.0–10.5)
nRBC: 0 % (ref 0.0–0.2)

## 2019-03-13 LAB — PROTIME-INR
INR: 2.7 — ABNORMAL HIGH (ref 0.8–1.2)
Prothrombin Time: 28.2 seconds — ABNORMAL HIGH (ref 11.4–15.2)

## 2019-03-13 MED ORDER — MIDODRINE HCL 5 MG PO TABS
ORAL_TABLET | ORAL | Status: AC
  Filled 2019-03-13: qty 2

## 2019-03-13 MED ORDER — SODIUM CHLORIDE 0.9 % IV SOLN: 100.0000 mL | INTRAVENOUS | Status: DC | PRN

## 2019-03-13 MED ORDER — LIDOCAINE HCL (PF) 1 % IJ SOLN: 5.0000 mL | INTRAMUSCULAR | Status: DC | PRN

## 2019-03-13 MED ORDER — HEPARIN SODIUM (PORCINE) 1000 UNIT/ML DIALYSIS
5800.0000 [IU] | Freq: Once | INTRAMUSCULAR | Status: DC
  Filled 2019-03-13: qty 6

## 2019-03-13 MED ORDER — LIDOCAINE-PRILOCAINE 2.5-2.5 % EX CREA: 1.0000 "application " | TOPICAL_CREAM | CUTANEOUS | Status: DC | PRN

## 2019-03-13 MED ORDER — PENTAFLUOROPROP-TETRAFLUOROETH EX AERO: 1.0000 "application " | INHALATION_SPRAY | CUTANEOUS | Status: DC | PRN

## 2019-03-13 NOTE — Care Management Important Message (Signed)
Important Message  Patient Details  Name: David [name] MRN: [id number] Date of Birth: [birth year]   Medicare Important Message Given:  Yes    David [name] [name] 03/13/2019, 3:57 PM

## 2019-03-13 NOTE — Progress Notes (Signed)
Brief GI progress note  I passed by to discuss with the patient and ensure he was still desiring a colonoscopy.  He is still having discomfort in the right lower quadrant region but it is slightly better than yesterday but has not improved with multiple days of IV antibiotics.  He is beginning the preparation for colonoscopy with anticipation of that being on 4/10.  He understands that his INR needs to be less than 2 because of it is not it may be postponed or canceled.  Early a.m. CBC and INR will be drawn to ensure that he is appropriate for procedures.   The risks and benefits of endoscopic evaluation were discussed with the patient; these include but are not limited to the risk of perforation, infection, bleeding, missed lesions, lack of diagnosis, severe illness requiring hospitalization, as well as anesthesia and sedation related illnesses.  The patient is agreeable to proceed.  The patient understands that this may be a slightly increased risk colonoscopy as a result of the question inflammation but as the patient has not had any prior documentation of diverticula in the cecum and is overdue for colonoscopy it is reasonable for [place] to pursue a diagnostic colonoscopy in this case and he understands and accepts those risks being increased.  Justice Britain, MD [facility] Advanced Endoscopy Office # [phone]

## 2019-03-13 NOTE — H&P (View-Only) (Signed)
Brief GI progress note  I passed by to discuss with the patient and ensure he was still desiring a colonoscopy.  He is still having discomfort in the right lower quadrant region but it is slightly better than yesterday but has not improved with multiple days of IV antibiotics.  He is beginning the preparation for colonoscopy with anticipation of that being on 4/10.  He understands that his INR needs to be less than 2 because of it is not it may be postponed or canceled.  Early a.m. CBC and INR will be drawn to ensure that he is appropriate for procedures.   The risks and benefits of endoscopic evaluation were discussed with the patient; these include but are not limited to the risk of perforation, infection, bleeding, missed lesions, lack of diagnosis, severe illness requiring hospitalization, as well as anesthesia and sedation related illnesses.  The patient is agreeable to proceed.  The patient understands that this may be a slightly increased risk colonoscopy as a result of the question inflammation but as the patient has not had any prior documentation of diverticula in the cecum and is overdue for colonoscopy it is reasonable for [place] to pursue a diagnostic colonoscopy in this case and he understands and accepts those risks being increased.  Justice Britain, MD [facility] Advanced Endoscopy Office # [phone]

## 2019-03-13 NOTE — Telephone Encounter (Signed)
Called patient again left message for return call on his answering machine.

## 2019-03-13 NOTE — Progress Notes (Signed)
[facility] PROGRESS NOTE    Progress Note  [facility]  SVX:[id number] DOB: 1941-02-25 DOA: 03/08/2019 PCP: [name], NP     Brief Narrative:   David [name] is an 78 y.o. 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 Thursdays and Saturdays, history of combined systolic and diastolic heart failure, HIV prostate cancer who came in with a right lower quadrant abdominal pain seated with nausea and vomiting, CT scan of the abdomen and pelvis shows cecal inflammation with colitis, admitted to the hospital started on broad-spectrum antibiotics  Assessment/Plan:   Acute Colitis presumed infectious Patient started empirically on Cipro and Flagyl, due to the minimal improvement transition to IV Zosyn.  He was placed n.p.o., GI was consulted he is scheduled for possible colonoscopy on 03/14/2019, due to high INR which is 2.7.  Question ischemic colitis  Chronic atrial fibrillation: His Coumadin was held on 03/10/2019, and are slowly trending down. INR today is 2.7 he was a single dose of vitamin K will repeat INR tomorrow hopefully is less than 2. Currently rate controlled.  End-stage renal disease: Continue dialysis per renal. Appears euvolemic.    HIV: Continue current home medications.  History of ADENOCARCINOMA, PROSTATE, GLEASON GRADE 3 Follow-up with oncology as an outpatient.  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   DVT prophylaxis: coumadin Family Communication:none Disposition Plan/Barrier to D/C: As work-up for abdominal pain and colitis completed. Code Status:     Code Status Orders  (From admission, onward)         Start     Ordered   03/09/19 0339  Full code  Continuous     03/09/19 0338        Code Status History    Date Active Date Inactive Code Status Order ID Comments User Context   03/04/2019 0735 03/06/2019 2321 Full  Code [id number]  Roxanne Mins, MD ED   02/11/2019 0203 02/12/2019 1524 Full Code [id number]  Vianne Bulls, MD ED   12/05/2018 0033 12/06/2018 2016 Full Code [id number]  Rise Patience, MD ED   11/01/2018 0918 11/02/2018 1340 Full Code [id number]  Karmen Bongo, MD ED   10/21/2018 2316 10/23/2018 1508 Full Code [id number]  Ivor Costa, MD ED   02/17/2018 0434 03/09/2018 1851 Full Code [id number]  Omar Person, NP ED   01/21/2018 0405 01/22/2018 1802 Full Code [id number]  Vianne Bulls, MD ED   11/24/2017 1934 12/01/2017 1621 Full Code [id number]  Omar Person, NP Inpatient   11/23/2017 0004 11/24/2017 1934 Full Code [id number]  Vianne Bulls, MD ED   05/12/2014 2022 05/15/2014 2106 Full Code [id number]  Marybelle Killings, MD Inpatient   05/02/2014 1642 05/12/2014 2022 Full Code [id number]  Geradine Girt, DO Inpatient        IV Access:    Peripheral IV   Procedures and diagnostic studies:   No results found.   Medical Consultants:    None.  Anti-Infectives:   None  Subjective:    [facility] relates his abdominal pain is much improved today.  Objective:    Vitals:   03/12/19 1707 03/12/19 1721 03/12/19 2055 03/13/19 0502  BP: (!) 98/56 110/66 106/63 117/62  Pulse: 75  75 78  Resp: _0 Temp: 98.6 F (37 C)  98.1 F (36.7 C) (!) 97.5 F (36.4 C)  TempSrc: Oral  Oral   SpO2:  98%   97%  Weight:   79.1 kg   Height:        Intake/Output Summary (Last 24 hours) at 03/13/2019 0909 Last data filed at 03/13/2019 0502 Gross per 24 hour  Intake 750 ml  Output 0 ml  Net 750 ml   Filed Weights   03/11/19 1105 03/11/19 2120 03/12/19 2055  Weight: 79.3 kg 79.3 kg 79.1 kg    Exam: General exam: In no acute distress. Respiratory system: Good air movement and clear to auscultation. Cardiovascular system: S1 & S2 heard, RRR. Gastrointestinal system: Abdomen is nondistended, soft and nontender.  Central nervous system: Alert and oriented. No focal  neurological deficits. Extremities: No pedal edema. Skin: No rashes, lesions or ulcers Psychiatry: Judgement and insight appear normal. Mood & affect appropriate.    Data Reviewed:    Labs: Basic Metabolic Panel: Recent Labs  Lab 03/06/19 0952 03/08/19 2218 03/09/19 0631 03/11/19 0713 03/12/19 0358  NA 138 136 136 137 137  K 4.5 4.4 4.5 5.1 4.2  CL 93* 89* 88* 89* 94*  CO2 _0 GLUCOSE 112* 134* 128* 109* 115*  BUN 88* 33* 39* 92* 43*  CREATININE 11.53* 6.80* 7.84* 13.00* 8.34*  CALCIUM 9.2 9.7 9.6 9.4 9.2  MG 2.4  --   --   --   --   PHOS 5.8*  --   --  11.1*  --    GFR Estimated Creatinine Clearance: 8.4 mL/min (A) (by C-G formula based on SCr of 8.34 mg/dL (H)). Liver Function Tests: Recent Labs  Lab 03/08/19 2218 03/09/19 0631 03/11/19 0713  AST 30 22  --   ALT 26 23  --   ALKPHOS 93 86  --   BILITOT 0.7 0.9  --   PROT 8.6* 8.0  --   ALBUMIN 4.2 3.9 3.0*   Recent Labs  Lab 03/08/19 2218  LIPASE 55*   No results for input(s): AMMONIA in the last 168 hours. Coagulation profile Recent Labs  Lab 03/09/19 0631 03/10/19 0548 03/11/19 0459 03/12/19 0358 03/13/19 0540  INR 2.1* 2.7* 4.0* 4.5* 2.7*    CBC: Recent Labs  Lab 03/08/19 2218 03/09/19 0631 03/11/19 0713 03/12/19 0358  WBC 11.7* 13.1* 15.7* 11.3*  NEUTROABS 9.1*  --   --   --   HGB 13.9 12.2* 11.0* 11.4*  HCT 42.5 37.8* 33.8* 35.8*  MCV 110.4* 107.4* 106.3* 106.5*  PLT 205 209 223 276   Cardiac Enzymes: No results for input(s): CKTOTAL, CKMB, CKMBINDEX, TROPONINI in the last 168 hours. BNP (last 3 results) No results for input(s): PROBNP in the last 8760 hours. CBG: Recent Labs  Lab 03/06/19 1125 03/06/19 1756  GLUCAP 114* 87   D-Dimer: No results for input(s): DDIMER in the last 72 hours. Hgb A1c: No results for input(s): HGBA1C in the last 72 hours. Lipid Profile: No results for input(s): CHOL, HDL, LDLCALC, TRIG, CHOLHDL, LDLDIRECT in the last 72 hours.  Thyroid function studies: No results for input(s): TSH, T4TOTAL, T3FREE, THYROIDAB in the last 72 hours.  Invalid input(s): FREET3 Anemia work up: Recent Labs    03/12/19 [place] 1,126*   Sepsis Labs: Recent Labs  Lab 03/08/19 2218 03/09/19 0631 03/11/19 0713 03/12/19 0358  WBC 11.7* 13.1* 15.7* 11.3*   Microbiology Recent Results (from the past 240 hour(s))  Respiratory Panel by PCR     Status: None   Collection Time: 03/04/19  8:15 AM  Result Value Ref Range Status  Adenovirus NOT DETECTED NOT DETECTED Final   Coronavirus 229E NOT DETECTED NOT DETECTED Final    Comment: (NOTE) The Coronavirus on the Respiratory Panel, DOES NOT test for the novel  Coronavirus (2019 nCoV)    Coronavirus HKU1 NOT DETECTED NOT DETECTED Final   Coronavirus NL63 NOT DETECTED NOT DETECTED Final   Coronavirus OC43 NOT DETECTED NOT DETECTED Final   Metapneumovirus NOT DETECTED NOT DETECTED Final   Rhinovirus / Enterovirus NOT DETECTED NOT DETECTED Final   Influenza A NOT DETECTED NOT DETECTED Final   Influenza B NOT DETECTED NOT DETECTED Final   Parainfluenza Virus 1 NOT DETECTED NOT DETECTED Final   Parainfluenza Virus 2 NOT DETECTED NOT DETECTED Final   Parainfluenza Virus 3 NOT DETECTED NOT DETECTED Final   Parainfluenza Virus 4 NOT DETECTED NOT DETECTED Final   Respiratory Syncytial Virus NOT DETECTED NOT DETECTED Final   Bordetella pertussis NOT DETECTED NOT DETECTED Final   Chlamydophila pneumoniae NOT DETECTED NOT DETECTED Final   Mycoplasma pneumoniae NOT DETECTED NOT DETECTED Final    Comment: Performed at [facility], [place] [address], [place], [place] [address]     Medications:   . allopurinol  100 mg Oral Daily  . amiodarone  200 mg Oral Daily  . bictegravir-emtricitabine-tenofovir AF  1 tablet Oral Daily  . bisacodyl  20 mg Oral Once  . brimonidine  1 drop Left Eye BID  . Chlorhexidine Gluconate Cloth  6 each Topical Q0600  . dorzolamide-timolol  1 drop  Left Eye BID  . ferric citrate  420 mg Oral TID WC  . latanoprost  1 drop Left Eye QHS  . loratadine  10 mg Oral Daily  . metoCLOPramide (REGLAN) injection  5 mg Intravenous Once   Followed by  . metoCLOPramide (REGLAN) injection  5 mg Intravenous Once  . midodrine  10 mg Oral 2 times per day on Tue Thu Sat  . midodrine  5 mg Oral Once per day on Sun Mon Wed Fri  . multivitamin  1 tablet Oral Daily  . peg 3350 powder  1 kit Oral Once  . [START ON 03/14/2019] Vitamin D (Ergocalciferol)  50,000 Units Oral Q Fri   Continuous Infusions: . piperacillin-tazobactam (ZOSYN)  IV 3.375 g (03/13/19 0406)      LOS: 4 days   Charlynne Cousins  [facility]  03/13/2019, 9:09 AM

## 2019-03-13 NOTE — Progress Notes (Signed)
[facility] Progress Note   Subjective: Seen on HD, still C/Os of abdominal tenderness-seems to wax and wane. Feels better now.    Objective Vitals:   03/12/19 1707 03/12/19 1721 03/12/19 2055 03/13/19 0502  BP: (!) 98/56 110/66 106/63 117/62  Pulse: 75  75 78  Resp: _0 Temp: 98.6 F (37 C)  98.1 F (36.7 C) (!) 97.5 F (36.4 C)  TempSrc: Oral  Oral   SpO2: 98%   97%  Weight:   79.1 kg   Height:       Physical Exam General:WN, WD older male in NAD Heart:S1,S2, RRR Lungs:CTAB A/P Abdomen:Tenderness lower quadrants of abd Extremities:No LE edema. Dry scaly skin BLE Dialysis Access:R AVGcannulated at present, no issues.    Additional Objective Labs: Basic Metabolic Panel: Recent Labs  Lab 03/06/19 0952  03/09/19 0631 03/11/19 0713 03/12/19 0358  NA 138   < > 136 137 137  K 4.5   < > 4.5 5.1 4.2  CL 93*   < > 88* 89* 94*  CO2 26   < > _1 GLUCOSE 112*   < > 128* 109* 115*  BUN 88*   < > 39* 92* 43*  CREATININE 11.53*   < > 7.84* 13.00* 8.34*  CALCIUM 9.2   < > 9.6 9.4 9.2  PHOS 5.8*  --   --  11.1*  --    < > = values in this interval not displayed.   Liver Function Tests: Recent Labs  Lab 03/08/19 2218 03/09/19 0631 03/11/19 0713  AST 30 22  --   ALT 26 23  --   ALKPHOS 93 86  --   BILITOT 0.7 0.9  --   PROT 8.6* 8.0  --   ALBUMIN 4.2 3.9 3.0*   Recent Labs  Lab 03/08/19 2218  LIPASE 55*   CBC: Recent Labs  Lab 03/08/19 2218 03/09/19 0631 03/11/19 0713 03/12/19 0358  WBC 11.7* 13.1* 15.7* 11.3*  NEUTROABS 9.1*  --   --   --   HGB 13.9 12.2* 11.0* 11.4*  HCT 42.5 37.8* 33.8* 35.8*  MCV 110.4* 107.4* 106.3* 106.5*  PLT 205 209 223 276   Blood Culture    Component Value Date/Time   SDES BLOOD LEFT FOREARM 02/11/2019 0030   SPECREQUEST  02/11/2019 0030    BOTTLES DRAWN AEROBIC AND ANAEROBIC Blood Culture results may not be optimal due to an inadequate volume of blood received in culture bottles   CULT  02/11/2019 0030    NO GROWTH 5 DAYS Performed at [facility] [address], [place], [place] [address]    REPTSTATUS 02/16/2019 FINAL 02/11/2019 0030    Cardiac Enzymes: No results for input(s): CKTOTAL, CKMB, CKMBINDEX, TROPONINI in the last 168 hours. CBG: Recent Labs  Lab 03/06/19 1125 03/06/19 1756  GLUCAP 114* 87   Iron Studies: No results for input(s): IRON, TIBC, TRANSFERRIN, FERRITIN in the last 72 hours. _2 @ Studies/Results: No results found. Medications: . piperacillin-tazobactam (ZOSYN)  IV 3.375 g (03/13/19 0406)   . allopurinol  100 mg Oral Daily  . amiodarone  200 mg Oral Daily  . bictegravir-emtricitabine-tenofovir AF  1 tablet Oral Daily  . bisacodyl  20 mg Oral Once  . brimonidine  1 drop Left Eye BID  . Chlorhexidine Gluconate Cloth  6 each Topical Q0600  . dorzolamide-timolol  1 drop Left Eye BID  . ferric citrate  420 mg Oral TID WC  .  latanoprost  1 drop Left Eye QHS  . loratadine  10 mg Oral Daily  . metoCLOPramide (REGLAN) injection  5 mg Intravenous Once   Followed by  . metoCLOPramide (REGLAN) injection  5 mg Intravenous Once  . midodrine  10 mg Oral 2 times per day on Tue Thu Sat  . midodrine  5 mg Oral Once per day on Sun Mon Wed Fri  . multivitamin  1 tablet Oral Daily  . peg 3350 powder  1 kit Oral Once  . [START ON 03/14/2019] Vitamin D (Ergocalciferol)  50,000 Units Oral Q Fri     Dialysis Orders: [facility] TTS4h 2/2 bath 80.5kg400/800 P2 RUA AVG -Hep 5800units IV TIW -Calcitriol 0.56mg PO TIW  Assessment/Plan: 1.Acute colitis. CT abd -thick walled cecum. Infectious vs inflammatory etiology. Neoplastic process not excluded. Initially started to improve on ABX and felt better but sx seem to wax and wane. GI following. Continue Zosyn. WBC 11.3. Possible colonoscopy tomorrow.  2. ESRD -HD TTS.HD today on schedule. K+ pending.  3. HTN/volume-BP on lower side today. HD 03/11/19 Pre wt 80.2 UFG 1.0 liter  Takes midodrine10 on HD days.Has had issues with hypotension at end of HD.Midodrine 10 mgmid run HD.Will need lower EDW on DC.Minimal volume removal with HD today.  4. Anemia-Hgb 11.4.No ESA needs. Mircera 75 last dosed 3/26 as outpatient. 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 Binders have been on hold. Resume as soon as he starts eating. Continue VDRA. 6. P Afib. On amiodarone, warfarin per pharmacy  7. NICM EF 25% 8. HIV. On ART 9. Nutrition: Albumin 3.0. clear liquids. Add prostat when eating.   Almena Hokenson H. Ife Vitelli NP-C 03/13/2019, 9:14 AM  [id number]

## 2019-03-14 ENCOUNTER — Encounter (HOSPITAL_COMMUNITY)
Admission: EM | Disposition: A | Discharge status: Home Health | Payer: Self-pay | Source: Home / Self Care | Attending: Internal Medicine

## 2019-03-14 ENCOUNTER — Inpatient Hospital Stay (HOSPITAL_COMMUNITY): Payer: Medicare HMO | Admitting: Certified Registered Nurse Anesthetist

## 2019-03-14 ENCOUNTER — Encounter (HOSPITAL_COMMUNITY): Payer: Self-pay

## 2019-03-14 DIAGNOSIS — K633 Ulcer of intestine: Secondary | ICD-10-CM

## 2019-03-14 DIAGNOSIS — K559 Vascular disorder of intestine, unspecified: Secondary | ICD-10-CM

## 2019-03-14 HISTORY — PX: BIOPSY: SHX5522

## 2019-03-14 HISTORY — PX: COLONOSCOPY WITH PROPOFOL: SHX5780

## 2019-03-14 LAB — BASIC METABOLIC PANEL
Anion gap: 18 — ABNORMAL HIGH (ref 5–15)
BUN: 39 mg/dL — ABNORMAL HIGH (ref 8–23)
CO2: 23 mmol/L (ref 22–32)
Calcium: 9 mg/dL (ref 8.9–10.3)
Chloride: 99 mmol/L (ref 98–111)
Creatinine, Ser: 8.88 mg/dL — ABNORMAL HIGH (ref 0.61–1.24)
GFR calc Af Amer: 6 mL/min — ABNORMAL LOW (ref >60)
GFR calc non Af Amer: 5 mL/min — ABNORMAL LOW (ref >60)
Glucose, Bld: 86 mg/dL (ref 70–99)
Potassium: 4 mmol/L (ref 3.5–5.1)
Sodium: 140 mmol/L (ref 135–145)

## 2019-03-14 LAB — CBC
HCT: 33.3 % — ABNORMAL LOW (ref 39.0–52.0)
Hemoglobin: 11.2 g/dL — ABNORMAL LOW (ref 13.0–17.0)
MCH: 35.6 pg — ABNORMAL HIGH (ref 26.0–34.0)
MCHC: 33.6 g/dL (ref 30.0–36.0)
MCV: 105.7 fL — ABNORMAL HIGH (ref 80.0–100.0)
Platelets: 279 10*3/uL (ref 150–400)
RBC: 3.15 MIL/uL — ABNORMAL LOW (ref 4.22–5.81)
RDW: 14.3 % (ref 11.5–15.5)
WBC: 8.5 10*3/uL (ref 4.0–10.5)
nRBC: 0 % (ref 0.0–0.2)

## 2019-03-14 LAB — MAGNESIUM: Magnesium: 2.5 mg/dL — ABNORMAL HIGH (ref 1.7–2.4)

## 2019-03-14 LAB — PROTIME-INR
INR: 2.2 — ABNORMAL HIGH (ref 0.8–1.2)
Prothrombin Time: 23.8 seconds — ABNORMAL HIGH (ref 11.4–15.2)

## 2019-03-14 SURGERY — COLONOSCOPY WITH PROPOFOL
Anesthesia: Monitor Anesthesia Care

## 2019-03-14 MED ORDER — PROPOFOL 500 MG/50ML IV EMUL
INTRAVENOUS | Status: DC | PRN
  Administered 2019-03-14: 100 ug/kg/min via INTRAVENOUS

## 2019-03-14 MED ORDER — SODIUM CHLORIDE 0.9 % IV SOLN
INTRAVENOUS | Status: DC | PRN
  Administered 2019-03-14: 11:00:00 via INTRAVENOUS

## 2019-03-14 MED ORDER — PROPOFOL 10 MG/ML IV BOLUS
INTRAVENOUS | Status: DC | PRN
  Administered 2019-03-14: 40 mg via INTRAVENOUS

## 2019-03-14 MED ORDER — SODIUM CHLORIDE 0.9 % IV SOLN
INTRAVENOUS | Status: DC | PRN
  Administered 2019-03-14: 11:00:00 30 ug/min via INTRAVENOUS

## 2019-03-14 SURGICAL SUPPLY — 22 items
ELECT REM PT RETURN 9FT ADLT (ELECTROSURGICAL)
ELECTRODE REM PT RTRN 9FT ADLT (ELECTROSURGICAL) IMPLANT
FCP BXJMBJMB 240X2.8X (CUTTING FORCEPS)
FLOOR PAD 36X40 (MISCELLANEOUS) ×4
FORCEPS BIOP RAD 4 LRG CAP 4 (CUTTING FORCEPS) IMPLANT
FORCEPS BIOP RJ4 240 W/NDL (CUTTING FORCEPS)
FORCEPS BXJMBJMB 240X2.8X (CUTTING FORCEPS) IMPLANT
INJECTOR/SNARE I SNARE (MISCELLANEOUS) IMPLANT
LUBRICANT JELLY 4.5OZ STERILE (MISCELLANEOUS) IMPLANT
MANIFOLD NEPTUNE II (INSTRUMENTS) IMPLANT
NDL SCLEROTHERAPY 25GX240 (NEEDLE) IMPLANT
NEEDLE SCLEROTHERAPY 25GX240 (NEEDLE) IMPLANT
PAD FLOOR 36X40 (MISCELLANEOUS) ×2 IMPLANT
PROBE APC STR FIRE (PROBE) IMPLANT
PROBE INJECTION GOLD (MISCELLANEOUS)
PROBE INJECTION GOLD 7FR (MISCELLANEOUS) IMPLANT
SNARE ROTATE MED OVAL 20MM (MISCELLANEOUS) IMPLANT
SYR 50ML LL SCALE MARK (SYRINGE) IMPLANT
TRAP SPECIMEN MUCOUS 40CC (MISCELLANEOUS) IMPLANT
TUBING ENDO SMARTCAP PENTAX (MISCELLANEOUS) IMPLANT
TUBING IRRIGATION ENDOGATOR (MISCELLANEOUS) ×4 IMPLANT
WATER STERILE IRR 1000ML POUR (IV SOLUTION) IMPLANT

## 2019-03-14 NOTE — Interval H&P Note (Signed)
History and Physical Interval Note:  03/14/2019 10:38 AM  [place]  has presented today for surgery, with the diagnosis of cecal colitis.  The various methods of treatment have been discussed with the patient and family. After consideration of risks, benefits and other options for treatment, the patient has consented to  Procedure(s): COLONOSCOPY WITH PROPOFOL (N/A) as a surgical intervention.  The patient's history has been reviewed, patient examined, no change in status, stable for surgery.  I have reviewed the patient's chart and labs.  Questions were answered to the patient's satisfaction.     David [name]

## 2019-03-14 NOTE — Anesthesia Preprocedure Evaluation (Signed)
Anesthesia Evaluation  Patient identified by MRN, date of birth, ID band Patient awake    Reviewed: Allergy & Precautions, H&P , NPO status , Patient's Chart, lab work & pertinent test results, reviewed documented beta blocker date and time   Airway Mallampati: II  TM Distance: >3 FB Neck ROM: Full    Dental no notable dental hx. (+) Partial Lower, Partial Upper, Dental Advisory Given   Pulmonary neg pulmonary ROS, sleep apnea , COPD, former smoker,    Pulmonary exam normal breath sounds clear to auscultation       Cardiovascular Exercise Tolerance: Good hypertension, Pt. on medications and Pt. on home beta blockers + CAD and +CHF  + dysrhythmias Atrial Fibrillation  Rhythm:Regular Rate:Normal     Neuro/Psych CVA negative psych ROS   GI/Hepatic Neg liver ROS, GERD  Medicated and Controlled,  Endo/Other  negative endocrine ROS  Renal/GU ESRF and DialysisRenal disease  negative genitourinary   Musculoskeletal  (+) Arthritis ,   Abdominal   Peds  Hematology  (+) Blood dyscrasia, anemia ,   Anesthesia Other Findings   Reproductive/Obstetrics negative OB ROS                             Anesthesia Physical  Anesthesia Plan  ASA: III  Anesthesia Plan: MAC   Post-op Pain Management:    Induction:   PONV Risk Score and Plan: 2 and Ondansetron and Propofol infusion  Airway Management Planned: Simple Face Mask, Nasal Cannula and Natural Airway  Additional Equipment:   Intra-op Plan:   Post-operative Plan:   Informed Consent: I have reviewed the patients History and Physical, chart, labs and discussed the procedure including the risks, benefits and alternatives for the proposed anesthesia with the patient or authorized representative who has indicated his/her understanding and acceptance.     Dental advisory given  Plan Discussed with: CRNA  Anesthesia Plan Comments:          Anesthesia Quick Evaluation

## 2019-03-14 NOTE — Anesthesia Postprocedure Evaluation (Signed)
Anesthesia Post Note  Patient: [name]  Procedure(s) Performed: COLONOSCOPY WITH PROPOFOL (N/A ) BIOPSY     Patient location during evaluation: Endoscopy Anesthesia Type: MAC Level of consciousness: awake Pain management: pain level controlled Vital Signs Assessment: post-procedure vital signs reviewed and stable Respiratory status: spontaneous breathing Cardiovascular status: stable Postop Assessment: no apparent nausea or vomiting Anesthetic complications: no    Last Vitals:  Vitals:   03/14/19 1130 03/14/19 1140  BP: (!) 95/59 95/64  Pulse: 77   Resp: (!) 22 16  Temp:    SpO2: 97% 100%    Last Pain:  Vitals:   03/14/19 1121  TempSrc: Oral  PainSc: 0-No pain   Pain Goal: Patients Stated Pain Goal: 0 (03/10/19 1529)                 Huston Foley

## 2019-03-14 NOTE — Transfer of Care (Signed)
Immediate Anesthesia Transfer of Care Note  Patient: [facility]  Procedure(s) Performed: COLONOSCOPY WITH PROPOFOL (N/A ) BIOPSY  Patient Location: Endoscopy Unit  Anesthesia Type:MAC  Level of Consciousness: awake, alert  and oriented  Airway & Oxygen Therapy: Patient Spontanous Breathing  Post-op Assessment: Report given to RN, Post -op Vital signs reviewed and stable and Patient moving all extremities  Post vital signs: Reviewed and stable  Last Vitals:  Vitals Value Taken Time  BP 90/50 03/14/2019 11:24 AM  Temp    Pulse 73 03/14/2019 11:21 AM  Resp 20 03/14/2019 11:26 AM  SpO2 98 % 03/14/2019 11:21 AM  Vitals shown include unvalidated device data.  Last Pain:  Vitals:   03/14/19 1121  TempSrc: Oral  PainSc: 0-No pain      Patients Stated Pain Goal: 0 (59/56/38 7564)  Complications: No apparent anesthesia complications

## 2019-03-14 NOTE — Op Note (Signed)
[name] Patient Name: David [name] Procedure Date : 03/14/2019 MRN: [id number] Attending MD: Docia Chuck. Henrene Pastor , MD Date of Birth: Sep 24, 1941 CSN: [id number] Age: 78 Admit Type: Inpatient Procedure:                Colonoscopy with biopsies Indications:              Abdominal pain in the right lower quadrant,                            Abnormal CT of the GI tract Providers:                Docia Chuck. Henrene Pastor, MD, Raynelle Bring, RN, Grace Isaac,                            RN, Laverda Sorenson, Technician, Trixie Deis, CRNA Referring MD:             [facility] Medicines:                Monitored Anesthesia Care Complications:            No immediate complications. Estimated blood loss:                            None. Estimated Blood Loss:     Estimated blood loss: none. Procedure:                Pre-Anesthesia Assessment:                           - Prior to the procedure, a History and Physical                            was performed, and patient medications and                            allergies were reviewed. The patient's tolerance of                            previous anesthesia was also reviewed. The risks                            and benefits of the procedure and the sedation                            options and risks were discussed with the patient.                            All questions were answered, and informed consent                            was obtained. Prior Anticoagulants: The patient has                            taken Coumadin (warfarin), last dose was 3 days  prior to procedure. ASA Grade Assessment: III - A                            patient with severe systemic disease. After                            reviewing the risks and benefits, the patient was                            deemed in satisfactory condition to undergo the                            procedure.                           After obtaining informed  consent, the colonoscope                            was passed under direct vision. Throughout the                            procedure, the patient's blood pressure, pulse, and                            oxygen saturations were monitored continuously. The                            CF-HQ190L ([id number]) Olympus colonoscope was                            introduced through the anus and advanced to the the                            cecum, identified by the ileocecal valve. The                            ileocecal valve and the rectum were photographed.                            The quality of the bowel preparation was excellent.                            The colonoscopy was performed without difficulty.                            The patient tolerated the procedure well. The bowel                            preparation used was SUPREP via split dose                            instruction. Scope In: 10:55:51 AM Scope Out: 11:12:20 AM Scope Withdrawal Time: 0 hours 8 minutes 50 seconds  Total Procedure Duration: 0 hours 16 minutes 29  seconds  Findings:      Severely ulcerated and necrotic mucosa were present in the entire cecum       and ileocecal valve. Biopsies were taken with a cold forceps for       histology.      The exam was otherwise without abnormality on direct views. Impression:               1. 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 The patient's risk factors for                            this less common site of ischemic colitis includes                            cardiomyopathy and dialysis related volume shifts.                            These patients may have fixed vascular stenosis but                            more commonly they experience vasospasm of the                            mesenteric vasculature. Unlike classic ischemic                            colitis, the long-term prognosis for this problem                             is less favorable as it tends to be recurrent with                            the associated risk of perforation. At this point,                            he is gradually improving. Normal white blood cell                            count and afebrile. Tender on exam but he tells me                            less so. As well, he is hungry. Recommendation:           1. Full liquid diet                           2. Follow-up biopsies                           3. Obtain vascular surgery opinion                           4. Continue  antibiotics                           5. If the patient has ANY clinical regression, then                            I would recommend an urgent general surgical                            consult.                           We will follow. Procedure Code(s):        --- Professional ---                           [phone], Colonoscopy, flexible; with biopsy, single                            or multiple Diagnosis Code(s):        --- Professional ---                           K63.3, Ulcer of intestine                           R10.31, Right lower quadrant pain                           R93.3, Abnormal findings on diagnostic imaging of                            other parts of digestive tract CPT copyright 2019 [employer]. All rights reserved. The codes documented in this report are preliminary and upon coder review may  be revised to meet current compliance requirements. Docia Chuck. Henrene Pastor, MD 03/14/2019 11:44:26 AM This report has been signed electronically. Number of Addenda: 0

## 2019-03-14 NOTE — Anesthesia Procedure Notes (Signed)
Procedure Name: MAC Date/Time: 03/14/2019 10:38 AM Performed by: Leonor Liv, CRNA Oxygen Delivery Method: Simple face mask Placement Confirmation: positive ETCO2

## 2019-03-14 NOTE — Progress Notes (Signed)
[facility] Progress Note   Subjective: Just returned from Endoscopy. No C/Os other than being hungry.    Objective Vitals:   03/14/19 1007 03/14/19 1121 03/14/19 1130 03/14/19 1140  BP: (!) 107/58 (!) 90/50 (!) 95/59 95/64  Pulse: 78 73 77   Resp: 19 (!) 21 (!) 22 16  Temp: 97.9 F (36.6 C) 98.2 F (36.8 C)    TempSrc: Axillary Oral    SpO2: 100% 98% 97% 100%  Weight:      Height:       Physical Exam General:WN, WD older male in NAD Heart:S1,S2, RRR Lungs:CTAB A/P Abdomen:Tenderness lower quadrants of abd Extremities:No LE edema. Dry scaly skin BLE Dialysis Access:R AVG+ T/B    Additional Objective Labs: Basic Metabolic Panel: Recent Labs  Lab 03/11/19 0713 03/12/19 0358 03/13/19 1540 03/14/19 0705  NA 137 137 137 140  K 5.1 4.2 3.7 4.0  CL 89* 94* 95* 99  CO2 24 26 26 23   GLUCOSE 109* 115* 105* 86  BUN 92* 43* 27* 39*  CREATININE 13.00* 8.34* 6.54* 8.88*  CALCIUM 9.4 9.2 8.8* 9.0  PHOS 11.1*  --  5.9*  --    Liver Function Tests: Recent Labs  Lab 03/08/19 2218 03/09/19 0631 03/11/19 0713 03/13/19 1540  AST 30 22  --   --   ALT 26 23  --   --   ALKPHOS 93 86  --   --   BILITOT 0.7 0.9  --   --   PROT 8.6* 8.0  --   --   ALBUMIN 4.2 3.9 3.0* 2.9*   Recent Labs  Lab 03/08/19 2218  LIPASE 55*   CBC: Recent Labs  Lab 03/08/19 2218 03/09/19 0631 03/11/19 0713 03/12/19 0358 03/13/19 1540 03/14/19 0705  WBC 11.7* 13.1* 15.7* 11.3* 9.1 8.5  NEUTROABS 9.1*  --   --   --   --   --   HGB 13.9 12.2* 11.0* 11.4* 11.7* 11.2*  HCT 42.5 37.8* 33.8* 35.8*  34.3* 36.0* 33.3*  MCV 110.4* 107.4* 106.3* 106.5* 106.5* 105.7*  PLT 205 209 223 276 302 279   Blood Culture    Component Value Date/Time   SDES BLOOD LEFT FOREARM 02/11/2019 0030   SPECREQUEST  02/11/2019 0030    BOTTLES DRAWN AEROBIC AND ANAEROBIC Blood Culture results may not be optimal due to an inadequate volume of blood received in culture bottles   CULT   02/11/2019 0030    NO GROWTH 5 DAYS Performed at [facility], [place] [address], [place], [place] [address]    REPTSTATUS 02/16/2019 FINAL 02/11/2019 0030    Cardiac Enzymes: No results for input(s): CKTOTAL, CKMB, CKMBINDEX, TROPONINI in the last 168 hours. CBG: No results for input(s): GLUCAP in the last 168 hours. Iron Studies: No results for input(s): IRON, TIBC, TRANSFERRIN, FERRITIN in the last 72 hours. @lablastinr3 @ Studies/Results: No results found. Medications: . sodium chloride    . sodium chloride    . piperacillin-tazobactam (ZOSYN)  IV 3.375 g (03/13/19 1613)   . allopurinol  100 mg Oral Daily  . amiodarone  200 mg Oral Daily  . bictegravir-emtricitabine-tenofovir AF  1 tablet Oral Daily  . brimonidine  1 drop Left Eye BID  . Chlorhexidine Gluconate Cloth  6 each Topical Q0600  . dorzolamide-timolol  1 drop Left Eye BID  . ferric citrate  420 mg Oral TID WC  . heparin  5,800 Units Dialysis Once in dialysis  . latanoprost  1 drop  Left Eye QHS  . loratadine  10 mg Oral Daily  . midodrine  10 mg Oral 2 times per day on Tue Thu Sat  . midodrine  5 mg Oral Once per day on Sun Mon Wed Fri  . multivitamin  1 tablet Oral Daily  . Vitamin D (Ergocalciferol)  50,000 Units Oral Q Fri     Dialysis Orders: [facility] TTS4h 2/2 bath 80.5kg400/800 P2 RUA AVG -Hep 5800units IV TIW -Calcitriol 0.38mcg PO TIW  Assessment/Plan: 1.Acute colitis. CT abd -thick walled cecum. Colonoscopy today revealed severe ischemic insult to entire cecum and ileocecal valve. Long term prognosis less favorable as this is associated with perforation. Per primary/GI. Continues on Zosyn.  2. ESRD -HD TTS.HD 03/15/2019 on schedule. 3. HTN/volume-HD 04/09 Pre wt 80.2 kg Net UF 373cc Post wt 78.8.  Midodrine 10 mgmid run HD.Will need lower EDW on DC.Minimal volume removal with HD tomorrow.  4. Anemia-Hgb 11.2.No ESA needs. Mircera 75 last dosed 3/26 as  outpatient. 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 Phos 5.9 Continue binders, decrease VDRA to 0.25 mcg PO TIW.  6. P Afib. On amiodarone, warfarin per pharmacy  7. NICM EF 25% 8. HIV. On ART 9. Nutrition: Albumin 2.9. Full liquids. Prostat, renal vits.   Oceanna Arruda H. Mirian Casco NP-C 03/14/2019, 12:10 PM  [employer] [phone]

## 2019-03-14 NOTE — Progress Notes (Signed)
[facility] PROGRESS NOTE    Progress Note  [facility]  IOE:[id number] DOB: [birth year] DOA: 03/08/2019 PCP: [name], NP     Brief Narrative:   David [name] is an 78 y.o. 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 Thursdays and Saturdays, history of combined systolic and diastolic heart failure, HIV prostate cancer who came in with a right lower quadrant abdominal pain seated with nausea and vomiting, CT scan of the abdomen and pelvis shows cecal inflammation with colitis, admitted to the hospital started on broad-spectrum antibiotics  Assessment/Plan:   Acute Colitis presumed infectious Patient started empirically on Cipro and Flagyl, due to the minimal improvement transition to IV Zosyn.  He was placed n.p.o., GI was consulted for colonoscopy today. INR which is 2.2.  Chronic atrial fibrillation: His Coumadin was held on 03/10/2019, and INR slowly trending down. Currently rate controlled.  End-stage renal disease: Continue dialysis per renal. Appears euvolemic.    HIV: Continue current home medications.  History of ADENOCARCINOMA, PROSTATE, GLEASON GRADE 3 Follow-up with oncology as an outpatient.  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   DVT prophylaxis: coumadin Family Communication:none Disposition Plan/Barrier to D/C: As work-up for abdominal pain and colitis completed. Code Status:     Code Status Orders  (From admission, onward)         Start     Ordered   03/09/19 0339  Full code  Continuous     03/09/19 0338        Code Status History    Date Active Date Inactive Code Status Order ID Comments User Context   03/04/2019 0735 03/06/2019 2321 Full Code [id number]  Roxanne Mins, MD ED   02/11/2019 0203 02/12/2019 1524 Full Code [id number]  Vianne Bulls, MD ED   12/05/2018 0033 12/06/2018 2016 Full Code [id number]   Rise Patience, MD ED   11/01/2018 0918 11/02/2018 1340 Full Code [id number]  Karmen Bongo, MD ED   10/21/2018 2316 10/23/2018 1508 Full Code [id number]  Ivor Costa, MD ED   02/17/2018 0434 03/09/2018 1851 Full Code [id number]  Omar Person, NP ED   01/21/2018 0405 01/22/2018 1802 Full Code [id number]  Vianne Bulls, MD ED   11/24/2017 1934 12/01/2017 1621 Full Code [id number]  Omar Person, NP Inpatient   11/23/2017 0004 11/24/2017 1934 Full Code [id number]  Vianne Bulls, MD ED   05/12/2014 2022 05/15/2014 2106 Full Code [id number]  Marybelle Killings, MD Inpatient   05/02/2014 1642 05/12/2014 2022 Full Code [id number]  Geradine Girt, DO Inpatient        IV Access:    Peripheral IV   Procedures and diagnostic studies:   No results found.   Medical Consultants:    None.  Anti-Infectives:   None  Subjective:    [facility] relates he is hungry, no complaints.  Objective:    Vitals:   03/13/19 2030 03/14/19 0540 03/14/19 0855 03/14/19 1007  BP: 111/64 120/68 115/72 (!) 107/58  Pulse: 73 78 81 78  Resp: 18 19 20 19   Temp: 98.1 F (36.7 C) 98.4 F (36.9 C) 98 F (36.7 C) 97.9 F (36.6 C)  TempSrc: Oral Oral Oral Axillary  SpO2: 98% 98% 100% 100%  Weight:      Height:        Intake/Output Summary (Last 24 hours) at 03/14/2019 1047 Last data filed at 03/14/2019 0540  Gross per 24 hour  Intake 240 ml  Output 373 ml  Net -133 ml   Filed Weights   03/12/19 2055 03/13/19 0900 03/13/19 1238  Weight: 79.1 kg 80.2 kg 78.8 kg    Exam: General exam: In no acute distress. Respiratory system: Good air movement and clear to auscultation. Cardiovascular system: S1 & S2 heard, RRR. Gastrointestinal system: Abdomen is nondistended, soft and nontender.  Central nervous system: Alert and oriented. No focal neurological deficits. Extremities: No pedal edema. Skin: No rashes, lesions or ulcers Psychiatry: Judgement and insight appear normal. Mood &  affect appropriate.    Data Reviewed:    Labs: Basic Metabolic Panel: Recent Labs  Lab 03/09/19 0631 03/11/19 0713 03/12/19 0358 03/13/19 1540 03/14/19 0705  NA 136 137 137 137 140  K 4.5 5.1 4.2 3.7 4.0  CL 88* 89* 94* 95* 99  CO2 31 24 26 26 23   GLUCOSE 128* 109* 115* 105* 86  BUN 39* 92* 43* 27* 39*  CREATININE 7.84* 13.00* 8.34* 6.54* 8.88*  CALCIUM 9.6 9.4 9.2 8.8* 9.0  MG  --   --   --   --  2.5*  PHOS  --  11.1*  --  5.9*  --    GFR Estimated Creatinine Clearance: 7.9 mL/min (A) (by C-G formula based on SCr of 8.88 mg/dL (H)). Liver Function Tests: Recent Labs  Lab 03/08/19 2218 03/09/19 0631 03/11/19 0713 03/13/19 1540  AST 30 22  --   --   ALT 26 23  --   --   ALKPHOS 93 86  --   --   BILITOT 0.7 0.9  --   --   PROT 8.6* 8.0  --   --   ALBUMIN 4.2 3.9 3.0* 2.9*   Recent Labs  Lab 03/08/19 2218  LIPASE 55*   No results for input(s): AMMONIA in the last 168 hours. Coagulation profile Recent Labs  Lab 03/10/19 0548 03/11/19 0459 03/12/19 0358 03/13/19 0540 03/14/19 0705  INR 2.7* 4.0* 4.5* 2.7* 2.2*    CBC: Recent Labs  Lab 03/08/19 2218 03/09/19 0631 03/11/19 0713 03/12/19 0358 03/13/19 1540 03/14/19 0705  WBC 11.7* 13.1* 15.7* 11.3* 9.1 8.5  NEUTROABS 9.1*  --   --   --   --   --   HGB 13.9 12.2* 11.0* 11.4* 11.7* 11.2*  HCT 42.5 37.8* 33.8* 35.8*  34.3* 36.0* 33.3*  MCV 110.4* 107.4* 106.3* 106.5* 106.5* 105.7*  PLT 205 209 223 276 302 279   Cardiac Enzymes: No results for input(s): CKTOTAL, CKMB, CKMBINDEX, TROPONINI in the last 168 hours. BNP (last 3 results) No results for input(s): PROBNP in the last 8760 hours. CBG: No results for input(s): GLUCAP in the last 168 hours. D-Dimer: No results for input(s): DDIMER in the last 72 hours. Hgb A1c: No results for input(s): HGBA1C in the last 72 hours. Lipid Profile: No results for input(s): CHOL, HDL, LDLCALC, TRIG, CHOLHDL, LDLDIRECT in the last 72 hours. Thyroid function  studies: No results for input(s): TSH, T4TOTAL, T3FREE, THYROIDAB in the last 72 hours.  Invalid input(s): FREET3 Anemia work up: Recent Labs    03/12/19 0358  VITAMINB12 1,126*   Sepsis Labs: Recent Labs  Lab 03/11/19 0713 03/12/19 0358 03/13/19 1540 03/14/19 0705  WBC 15.7* 11.3* 9.1 8.5   Microbiology No results found for this or any previous visit (from the past 240 hour(s)).   Medications:   . [MAR Hold] allopurinol  100 mg Oral Daily  . [facility]  Hold] amiodarone  200 mg Oral Daily  . [MAR Hold] bictegravir-emtricitabine-tenofovir AF  1 tablet Oral Daily  . [MAR Hold] brimonidine  1 drop Left Eye BID  . [MAR Hold] Chlorhexidine Gluconate Cloth  6 each Topical Q0600  . [MAR Hold] dorzolamide-timolol  1 drop Left Eye BID  . [MAR Hold] ferric citrate  420 mg Oral TID WC  . [MAR Hold] heparin  5,800 Units Dialysis Once in dialysis  . [MAR Hold] latanoprost  1 drop Left Eye QHS  . [MAR Hold] loratadine  10 mg Oral Daily  . [MAR Hold] midodrine  10 mg Oral 2 times per day on Tue Thu Sat  . [MAR Hold] midodrine  5 mg Oral Once per day on Sun Mon Wed Fri  . [MAR Hold] multivitamin  1 tablet Oral Daily  . [MAR Hold] Vitamin D (Ergocalciferol)  50,000 Units Oral Q Fri   Continuous Infusions: . [MAR Hold] sodium chloride    . [MAR Hold] sodium chloride    . [MAR Hold] piperacillin-tazobactam (ZOSYN)  IV 3.375 g (03/13/19 1613)      LOS: 5 days   Charlynne Cousins  [facility]  03/14/2019, 10:47 AM

## 2019-03-15 ENCOUNTER — Inpatient Hospital Stay (HOSPITAL_COMMUNITY): Payer: Medicare HMO

## 2019-03-15 DIAGNOSIS — R935 Abnormal findings on diagnostic imaging of other abdominal regions, including retroperitoneum: Secondary | ICD-10-CM

## 2019-03-15 DIAGNOSIS — K559 Vascular disorder of intestine, unspecified: Secondary | ICD-10-CM

## 2019-03-15 DIAGNOSIS — Z992 Dependence on renal dialysis: Secondary | ICD-10-CM

## 2019-03-15 DIAGNOSIS — K633 Ulcer of intestine: Secondary | ICD-10-CM

## 2019-03-15 DIAGNOSIS — N186 End stage renal disease: Secondary | ICD-10-CM

## 2019-03-15 DIAGNOSIS — R1031 Right lower quadrant pain: Secondary | ICD-10-CM

## 2019-03-15 LAB — CBC
HCT: 33.5 % — ABNORMAL LOW (ref 39.0–52.0)
Hemoglobin: 10.9 g/dL — ABNORMAL LOW (ref 13.0–17.0)
MCH: 34.3 pg — ABNORMAL HIGH (ref 26.0–34.0)
MCHC: 32.5 g/dL (ref 30.0–36.0)
MCV: 105.3 fL — ABNORMAL HIGH (ref 80.0–100.0)
Platelets: 358 10*3/uL (ref 150–400)
RBC: 3.18 MIL/uL — ABNORMAL LOW (ref 4.22–5.81)
RDW: 13.9 % (ref 11.5–15.5)
WBC: 8.3 10*3/uL (ref 4.0–10.5)
nRBC: 0 % (ref 0.0–0.2)

## 2019-03-15 LAB — RENAL FUNCTION PANEL
Albumin: 2.5 g/dL — ABNORMAL LOW (ref 3.5–5.0)
Anion gap: 17 — ABNORMAL HIGH (ref 5–15)
BUN: 59 mg/dL — ABNORMAL HIGH (ref 8–23)
CO2: 22 mmol/L (ref 22–32)
Calcium: 8.8 mg/dL — ABNORMAL LOW (ref 8.9–10.3)
Chloride: 96 mmol/L — ABNORMAL LOW (ref 98–111)
Creatinine, Ser: 11.95 mg/dL — ABNORMAL HIGH (ref 0.61–1.24)
GFR calc Af Amer: 4 mL/min — ABNORMAL LOW (ref >60)
GFR calc non Af Amer: 4 mL/min — ABNORMAL LOW (ref >60)
Glucose, Bld: 148 mg/dL — ABNORMAL HIGH (ref 70–99)
Phosphorus: 8.7 mg/dL — ABNORMAL HIGH (ref 2.5–4.6)
Potassium: 4.1 mmol/L (ref 3.5–5.1)
Sodium: 135 mmol/L (ref 135–145)

## 2019-03-15 LAB — LACTATE DEHYDROGENASE: LDH: 121 U/L (ref 98–192)

## 2019-03-15 LAB — PROTIME-INR
INR: 2.4 — ABNORMAL HIGH (ref 0.8–1.2)
Prothrombin Time: 25.7 seconds — ABNORMAL HIGH (ref 11.4–15.2)

## 2019-03-15 LAB — LACTIC ACID, PLASMA: Lactic Acid, Venous: 1 mmol/L (ref 0.5–1.9)

## 2019-03-15 MED ORDER — SODIUM CHLORIDE 0.9 % IV SOLN
INTRAVENOUS | Status: DC | PRN
  Administered 2019-03-15: 250 mL via INTRAVENOUS

## 2019-03-15 MED ORDER — IOHEXOL 350 MG/ML SOLN
100.0000 mL | Freq: Once | INTRAVENOUS | Status: AC | PRN
  Administered 2019-03-15: 100 mL via INTRAVENOUS

## 2019-03-15 MED ORDER — PRO-STAT SUGAR FREE PO LIQD
30.0000 mL | Freq: Two times a day (BID) | ORAL | Status: DC
  Administered 2019-03-15 – 2019-03-18 (×7): 30 mL via ORAL
  Filled 2019-03-15 (×6): qty 30

## 2019-03-15 NOTE — Consult Note (Signed)
Referring Physician: Dr Olevia Bowens  Patient name: David [name] MRN: [id number] DOB: 1941-09-28 Sex: male  REASON FOR CONSULT: rule out vascular etiology for ischemic colon  HPI: [facility] Frith is a 78 y.o. male with a one week history for right lower quadrant pain.  He had a CT scan and colonoscopy suggestive of ischemic colitis.  He states that he frequently has hypotension on HD with BP down into the 80s but sometimes as low as 40s.  He has not had a prior ischemic colitis event.  He is feeling better today and the pain is less.  He is not currently taking narcotics for pain control.  He denies fever or chills.  He is currently on zosyn.  Other medical problems include ESRD, CHF, afib, HIV, all stable  Past Medical History:  Diagnosis Date  . Acute on chronic systolic and diastolic heart failure, NYHA class 4 ([place])   . Anemia, iron deficiency 11/15/2011  . Arthritis    "hands, right knee, feet" (02/21/2018)  . Atrial fibrillation ([place])   . Cancer [facility])    hx of prostate; s/p radioactive seed implant 10/2009 Dr Janice Norrie  . Cardiac arrest ([place]) 02/17/2018  . CHF (congestive heart failure) ([place])   . Chronic lower back pain   . CKD (chronic kidney disease) stage V requiring chronic dialysis ([place])   . ESRD on dialysis ([place])    started 02/2018  . GERD (gastroesophageal reflux disease)   . Glaucoma   . Gout    daily RX (02/21/2018)  . Heart murmur    "mild" per pt  . Hepatitis    years ago  . History of cardiac cath 2004   negative for CAD  . History of myocardial perfusion scan 02/2010   negative for coronary insufficiency (LVEF 27%)  . HIV (human immunodeficiency virus infection) ([place])   . HIV infection ([place])   . Hyperlipidemia   . Hypertension    followed by [facility] (Dr Dani Gobble Croitoru)  . Hypertension   . Nonischemic cardiomyopathy ([place])   . Pneumonia 11/2017  . Prostate cancer ([place])   . Sinus bradycardia   . Sleep apnea    does not use a cpap   . Stroke [facility])    "mini stroke" years ago  . Systolic and diastolic CHF, acute ([place]) 06/2010   felt to be secondary to hypertensive cardiomyopathy   Past Surgical History:  Procedure Laterality Date  . AV FISTULA PLACEMENT Right 10/13/2016   Procedure: ARTERIOVENOUS (AV) FISTULA CREATION;  Surgeon: [name], MD;  Location: [place];  Service: Vascular;  Laterality: Right;  . AV FISTULA PLACEMENT Right 10/13/2016   [name] [phone]  . AV FISTULA PLACEMENT Left 02/25/2018   Procedure: INSERTION OF ARTERIOVENOUS (AV) GORE-TEX GRAFT LEFT UPPER ARM;  Surgeon: Angelia Mould, MD;  Location: [place];  Service: Vascular;  Laterality: Left;  . AV FISTULA PLACEMENT Right 08/07/2018   Procedure: Creation of right arm brachiocephalic Fistula;  Surgeon: Waynetta Sandy, MD;  Location: [place];  Service: Vascular;  Laterality: Right;  . BASCILIC VEIN TRANSPOSITION Left 06/24/2015   Procedure: BASILIC VEIN TRANSPOSITION;  Surgeon: Mal Misty, MD;  Location: [place];  Service: Vascular;  Laterality: Left;  . BASCILIC VEIN TRANSPOSITION Left 05/31/3661 Bascilic vein transposition (Left)   [name] [phone]  . CARDIAC CATHETERIZATION  02/04/2003   mildly depressed LV systolic fx EF 35%,WSFKCL coronaries/abdominal aorta/renal arteries.  Marland Kitchen CARDIAC CATHETERIZATION  2004  . CATARACT EXTRACTION,  BILATERAL Bilateral   . COLONOSCOPY    . ESOPHAGOGASTRODUODENOSCOPY (EGD) WITH PROPOFOL N/A 05/05/2014   Procedure: ESOPHAGOGASTRODUODENOSCOPY (EGD) WITH PROPOFOL;  Surgeon: Missy Sabins, MD;  Location: [facility];  Service: Endoscopy;  Laterality: N/A;  . EYE SURGERY Bilateral    cataract surgery   . EYE SURGERY Bilateral    glaucoma surgery  . EYE SURGERY    . GLAUCOMA SURGERY Bilateral   . INSERTION OF ARTERIOVENOUS (AV) ARTEGRAFT ARM  10/09/2018   Procedure: INSERTION OF ARTERIOVENOUS (AV) 53mm x 41cm ARTEGRAFT LEFT UPPER ARM;  Surgeon: Waynetta Sandy, MD;  Location: [place];  Service: Vascular;;   . INSERTION PROSTATE RADIATION SEED    . IR FLUORO GUIDE CV LINE RIGHT  02/18/2018  . IR REMOVAL TUN CV CATH W/O FL  12/06/2018  . IR US GUIDE VASC ACCESS RIGHT  02/18/2018  . LEFT HEART CATH AND CORONARY ANGIOGRAPHY N/A 02/20/2018   Procedure: LEFT HEART CATH AND CORONARY ANGIOGRAPHY;  Surgeon: Jettie Booze, MD;  Location: [place] [facility];  Service: Cardiovascular;  Laterality: N/A;  . NM MYOCAR PERF WALL MOTION  02/21/2010   normal  . RADIOACTIVE SEED IMPLANT  2010   prostate cancer  . THROMBECTOMY W/ EMBOLECTOMY Left 02/26/2018   Procedure: THROMBECTOMY ARTERIOVENOUS GRAFT;  Surgeon: Angelia Mould, MD;  Location: [place];  Service: Vascular;  Laterality: Left;  . ULTRASOUND GUIDANCE FOR VASCULAR ACCESS  02/20/2018   Procedure: Ultrasound Guidance For Vascular Access;  Surgeon: Jettie Booze, MD;  Location: [place] [facility];  Service: Cardiovascular;;  . UPPER EXTREMITY VENOGRAPHY N/A 07/08/2018   Procedure: UPPER EXTREMITY VENOGRAPHY;  Surgeon: Waynetta Sandy, MD;  Location: [place] [facility];  Service: Cardiovascular;  Laterality: N/A;  Bilateral  . US ECHOCARDIOGRAPHY  02/06/2012   mild LVH,LA mod. dilated,mild-mod. 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 AI.    Family History  Problem Relation Age of Onset  . Hypertension Mother   . Thyroid disease Mother   . Cholelithiasis Daughter   . Cholelithiasis Son   . Hypertension Maternal Grandmother   . Diabetes Maternal Grandmother   . Heart attack Neg Hx   . Hyperlipidemia Neg Hx     SOCIAL HISTORY: Social History   Socioeconomic History  . Marital status: Married    Spouse name: Not on file  . Number of children: 2  . Years of education: 23  . Highest education level: Not on file  Occupational History  . Occupation: retired, picks up Aeronautical engineer: RETIRED  Social Needs  . Financial resource strain: Not on file  . Food insecurity:    Worry: Not on file     Inability: Not on file  . Transportation needs:    Medical: Not on file    Non-medical: Not on file  Tobacco Use  . Smoking status: Former Smoker    Packs/day: 1.00    Years: 30.00    Pack years: 30.00    Types: Cigarettes    Last attempt to quit: 2006    Years since quitting: 14.2  . Smokeless tobacco: Never Used  Substance and Sexual Activity  . Alcohol use: Yes    Comment: once a week-wine  . Drug use: Never  . Sexual activity: Not Currently  Lifestyle  . Physical activity:    Days per week: Not on file    Minutes per session: Not on file  . Stress: Not on file  Relationships  . Social connections:    Talks on phone: Not on file    Gets together: Not on file    Attends religious service: Not on file    Active member of club or organization: Not on file    Attends meetings of clubs or organizations: Not on file    Relationship status: Not on file  . Intimate partner violence:    Fear of current or ex partner: Not on file    Emotionally abused: Not on file    Physically abused: Not on file    Forced sexual activity: Not on file  Other Topics Concern  . Not on file  Social History Narrative   ** Merged History Encounter **       Stays active at home Regular exercise: no Drinks 2 cups of coffee a week, 1 mountain dew soda a day.    Allergies  Allergen Reactions  . Cozaar [Losartan Potassium] Other (See Comments)    Causes constipation     Current Facility-Administered Medications  Medication Dose Route Frequency Provider Last Rate Last Dose  . 0.9 %  sodium chloride infusion  100 mL Intravenous PRN Irene Shipper, MD      . 0.9 %  sodium chloride infusion  100 mL Intravenous PRN Irene Shipper, MD      . 0.9 %  sodium chloride infusion   Intravenous PRN Charlynne Cousins, MD 5 mL/hr at 03/15/19 0451 250 mL at 03/15/19 0451  . acetaminophen (TYLENOL) tablet 1,000 mg  1,000 mg Oral Q6H PRN Irene Shipper, MD   1,000 mg at 03/13/19 2216  . allopurinol  (ZYLOPRIM) tablet 100 mg  100 mg Oral Daily Irene Shipper, MD   100 mg at 03/15/19 1014  . amiodarone (PACERONE) tablet 200 mg  200 mg Oral Daily Irene Shipper, MD   200 mg at 03/15/19 1012  . bictegravir-emtricitabine-tenofovir AF ([name]) 50-200-25 MG per tablet 1 tablet  1 tablet Oral Daily Irene Shipper, MD   1 tablet at 03/15/19 1011  . brimonidine (ALPHAGAN) 0.15 % ophthalmic solution 1 drop  1 drop Left Eye BID Irene Shipper, MD   1 drop at 03/15/19 1012  . Chlorhexidine Gluconate Cloth 2 % PADS 6 each  6 each Topical Q0600 Irene Shipper, MD   6 each at 03/10/19 1007  . dorzolamide-timolol (COSOPT) 22.3-6.8 MG/ML ophthalmic solution 1 drop  1 drop Left Eye BID Irene Shipper, MD   1 drop at 03/15/19 1012  . feeding supplement (PRO-STAT SUGAR FREE 64) liquid 30 mL  30 mL Oral BID Alric Seton, PA-C   30 mL at 03/15/19 1258  . ferric citrate (AURYXIA) tablet 420 mg  420 mg Oral TID WC Irene Shipper, MD   420 mg at 03/15/19 0843  . HYDROmorphone (DILAUDID) injection 1 mg  1 mg Intravenous Q4H PRN Irene Shipper, MD   1 mg at 03/14/19 2219  . latanoprost (XALATAN) 0.005 % ophthalmic solution 1 drop  1 drop Left Eye QHS Irene Shipper, MD   1 drop at 03/14/19 2219  . lidocaine (PF) (XYLOCAINE) 1 % injection 5 mL  5 mL Intradermal PRN Irene Shipper, MD      . lidocaine-prilocaine (EMLA) cream 1 application  1 application Topical PRN Irene Shipper, MD      . loratadine (CLARITIN) tablet 10 mg  10 mg Oral Daily Irene Shipper, MD   10 mg at  03/15/19 1011  . midodrine (PROAMATINE) tablet 10 mg  10 mg Oral 2 times per day on Tue Thu Sat Irene Shipper, MD   10 mg at 03/15/19 1256  . midodrine (PROAMATINE) tablet 5 mg  5 mg Oral Once per day on Sun Mon Wed Fri Irene Shipper, MD   5 mg at 03/14/19 1458  . multivitamin (RENA-VIT) tablet 1 tablet  1 tablet Oral Daily Irene Shipper, MD   1 tablet at 03/15/19 1014  . ondansetron (ZOFRAN) injection 4 mg  4 mg Intravenous Q6H PRN Irene Shipper, MD   4 mg at  03/09/19 0847  . ondansetron (ZOFRAN) injection 4 mg  4 mg Intravenous Q6H PRN Irene Shipper, MD   4 mg at 03/13/19 1446  . pentafluoroprop-tetrafluoroeth (GEBAUERS) aerosol 1 application  1 application Topical PRN Irene Shipper, MD      . piperacillin-tazobactam (ZOSYN) IVPB 3.375 g  3.375 g Intravenous Q12H Irene Shipper, MD 12.5 mL/hr at 03/15/19 0442 3.375 g at 03/15/19 0442  . Vitamin D (Ergocalciferol) (DRISDOL) capsule 50,000 Units  50,000 Units Oral Q Vinnie Langton, MD   50,000 Units at 03/14/19 1451    ROS:   General:  No weight loss, Fever, chills  HEENT: No recent headaches, no nasal bleeding, no visual changes, no sore throat  Neurologic: No dizziness, blackouts, seizures. No recent symptoms of stroke or mini- stroke. No recent episodes of slurred speech, or temporary blindness.  Cardiac: No recent episodes of chest pain/pressure, no shortness of breath at rest.  No shortness of breath with exertion.  Denies history of atrial fibrillation or irregular heartbeat  Vascular: No history of rest pain in feet.  No history of claudication.  No history of non-healing ulcer, No history of DVT   Pulmonary: No home oxygen, no productive cough, no hemoptysis,  No asthma or wheezing  Musculoskeletal:  [ ]  Arthritis, [ ]  Low back pain,  [ ]  Joint pain  Hematologic:No history of hypercoagulable state.  No history of easy bleeding.  No history of anemia  Gastrointestinal: No hematochezia or melena,  No gastroesophageal reflux, no trouble swallowing  Urinary: [X]  chronic Kidney disease, [X]  on HD - [ ]  MWF or [ ]  TTHS, [ ]  Burning with urination, [ ]  Frequent urination, [ ]  Difficulty urinating;   Skin: No rashes  Psychological: No history of anxiety,  No history of depression   Physical Examination  Vitals:   03/14/19 1955 03/15/19 0424 03/15/19 0616 03/15/19 0919  BP: 97/63 116/68  118/61  Pulse: 77 72  76  Resp: 16   18  Temp: 98.4 F (36.9 C) 97.6 F (36.4 C)  97.6 F  (36.4 C)  TempSrc: Oral Oral  Oral  SpO2: 97% 100%  99%  Weight:   77.6 kg   Height:        Body mass index is 21.96 kg/m.  General:  Alert and oriented, no acute distress HEENT: Normal Neck: No JVD Pulmonary: Clear to auscultation bilaterally Cardiac: Regular Rate and Rhythm  Abdomen: Soft, mild right lower quadrant tenderness, non-distended, no mass Skin: No rash Extremity Pulses:  2+ radial, brachial, femoral, dorsalis pedis pulses bilaterally Musculoskeletal: No deformity or edema  Neurologic: Upper and lower extremity motor 5/5 and symmetric  DATA:  CTA reviewed from today.  Mild celiac artery narrowing 50%, widely patent SMA, IMA is patent, both internal iliac arteries are patent, thickened edematous cecum and ascending colon without pneumotosis  ASSESSMENT: Watershed ischemic colitis from most likely hypotension on HD.  Ischemic colitis is rarely a disease that requires vascular surgical intervention.  Usually bowel rest and antibiotics resolve most cases.  As mentioned by GI if clinically worse wound consult General surgery as usually treatment for failed conservative management for ischemic colitis is colon resection not revascularization.  Will sign off.  Call if questions.  Ruta Hinds, MD [facility] Office: [phone] Pager: [phone]

## 2019-03-15 NOTE — Progress Notes (Signed)
[facility] PROGRESS NOTE    Progress Note  [facility]  DXI:[id number] DOB: 1941/08/25 DOA: 03/08/2019 PCP: [name], NP     Brief Narrative:   David [name] is an 78 y.o. 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 Thursdays and Saturdays, history of combined systolic and diastolic heart failure, HIV prostate cancer who came in with a right lower quadrant abdominal pain seated with nausea and vomiting, CT scan of the abdomen and pelvis shows cecal inflammation with colitis, admitted to the hospital started on broad-spectrum antibiotics  Assessment/Plan:   Acute Ischemic Colitis Patient started empirically on Cipro and Flagyl, due to the minimal improvement transition to IV Zosyn.  He was placed n.p.o., GI was consulted and colonoscopy was performed on 03/14/2019 that showed severe ischemic colitis. We will get a CT Angio of the abdomen to rule out any significant stenosis or surgery. INR which is 2.4. Consult vascular surgery, check an LDH and a lactate.  Chronic atrial fibrillation: His Coumadin was held on 03/10/2019, INR slowly trending up. Currently rate controlled.  End-stage renal disease: Continue dialysis per renal. Appears euvolemic.    HIV: Continue current home medications.  History of ADENOCARCINOMA, PROSTATE, GLEASON GRADE 3 Follow-up with oncology as an outpatient.  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   DVT prophylaxis: coumadin Family Communication:none Disposition Plan/Barrier to D/C: As work-up for abdominal pain and colitis completed. Code Status:     Code Status Orders  (From admission, onward)         Start     Ordered   03/09/19 0339  Full code  Continuous     03/09/19 0338        Code Status History    Date Active Date Inactive Code Status Order ID Comments User Context   03/04/2019 0735 03/06/2019  2321 Full Code [id number]  Roxanne Mins, MD ED   02/11/2019 0203 02/12/2019 1524 Full Code [id number]  Vianne Bulls, MD ED   12/05/2018 0033 12/06/2018 2016 Full Code [id number]  Rise Patience, MD ED   11/01/2018 0918 11/02/2018 1340 Full Code [id number]  Karmen Bongo, MD ED   10/21/2018 2316 10/23/2018 1508 Full Code [id number]  Ivor Costa, MD ED   02/17/2018 0434 03/09/2018 1851 Full Code [id number]  Omar Person, NP ED   01/21/2018 0405 01/22/2018 1802 Full Code [id number]  Vianne Bulls, MD ED   11/24/2017 1934 12/01/2017 1621 Full Code [id number]  Omar Person, NP Inpatient   11/23/2017 0004 11/24/2017 1934 Full Code [id number]  Vianne Bulls, MD ED   05/12/2014 2022 05/15/2014 2106 Full Code [id number]  Marybelle Killings, MD Inpatient   05/02/2014 1642 05/12/2014 2022 Full Code [id number]  Geradine Girt, DO Inpatient        IV Access:    Peripheral IV   Procedures and diagnostic studies:   No results found.   Medical Consultants:    None.  Anti-Infectives:   None  Subjective:    [facility] relates his abdominal pain is improved.  Objective:    Vitals:   03/14/19 1955 03/15/19 0424 03/15/19 0616 03/15/19 0919  BP: 97/63 116/68  118/61  Pulse: 77 72  76  Resp: 16   18  Temp: 98.4 F (36.9 C) 97.6 F (36.4 C)  97.6 F (36.4 C)  TempSrc: Oral Oral  Oral  SpO2: 97% 100%  99%  Weight:   77.6 kg   Height:        Intake/Output Summary (Last 24 hours) at 03/15/2019 0940 Last data filed at 03/15/2019 0616 Gross per 24 hour  Intake 1109.63 ml  Output 0 ml  Net 1109.63 ml   Filed Weights   03/13/19 0900 03/13/19 1238 03/15/19 0616  Weight: 80.2 kg 78.8 kg 77.6 kg    Exam: General exam: In no acute distress. Respiratory system: Good air movement and clear to auscultation. Cardiovascular system: S1 & S2 heard, RRR. Gastrointestinal system: Abdomen is nondistended, soft and nontender.  Central nervous system: Alert and oriented. No focal  neurological deficits. Extremities: No pedal edema. Skin: No rashes, lesions or ulcers Psychiatry: Judgement and insight appear normal. Mood & affect appropriate.    Data Reviewed:    Labs: Basic Metabolic Panel: Recent Labs  Lab 03/09/19 0631 03/11/19 0713 03/12/19 0358 03/13/19 1540 03/14/19 0705  NA 136 137 137 137 140  K 4.5 5.1 4.2 3.7 4.0  CL 88* 89* 94* 95* 99  CO2 31 24 26 26 23   GLUCOSE 128* 109* 115* 105* 86  BUN 39* 92* 43* 27* 39*  CREATININE 7.84* 13.00* 8.34* 6.54* 8.88*  CALCIUM 9.6 9.4 9.2 8.8* 9.0  MG  --   --   --   --  2.5*  PHOS  --  11.1*  --  5.9*  --    GFR Estimated Creatinine Clearance: 7.8 mL/min (A) (by C-G formula based on SCr of 8.88 mg/dL (H)). Liver Function Tests: Recent Labs  Lab 03/08/19 2218 03/09/19 0631 03/11/19 0713 03/13/19 1540  AST 30 22  --   --   ALT 26 23  --   --   ALKPHOS 93 86  --   --   BILITOT 0.7 0.9  --   --   PROT 8.6* 8.0  --   --   ALBUMIN 4.2 3.9 3.0* 2.9*   Recent Labs  Lab 03/08/19 2218  LIPASE 55*   No results for input(s): AMMONIA in the last 168 hours. Coagulation profile Recent Labs  Lab 03/11/19 0459 03/12/19 0358 03/13/19 0540 03/14/19 0705 03/15/19 0215  INR 4.0* 4.5* 2.7* 2.2* 2.4*    CBC: Recent Labs  Lab 03/08/19 2218 03/09/19 0631 03/11/19 0713 03/12/19 0358 03/13/19 1540 03/14/19 0705  WBC 11.7* 13.1* 15.7* 11.3* 9.1 8.5  NEUTROABS 9.1*  --   --   --   --   --   HGB 13.9 12.2* 11.0* 11.4* 11.7* 11.2*  HCT 42.5 37.8* 33.8* 35.8*  34.3* 36.0* 33.3*  MCV 110.4* 107.4* 106.3* 106.5* 106.5* 105.7*  PLT 205 209 223 276 302 279   Cardiac Enzymes: No results for input(s): CKTOTAL, CKMB, CKMBINDEX, TROPONINI in the last 168 hours. BNP (last 3 results) No results for input(s): PROBNP in the last 8760 hours. CBG: No results for input(s): GLUCAP in the last 168 hours. D-Dimer: No results for input(s): DDIMER in the last 72 hours. Hgb A1c: No results for input(s): HGBA1C in  the last 72 hours. Lipid Profile: No results for input(s): CHOL, HDL, LDLCALC, TRIG, CHOLHDL, LDLDIRECT in the last 72 hours. Thyroid function studies: No results for input(s): TSH, T4TOTAL, T3FREE, THYROIDAB in the last 72 hours.  Invalid input(s): FREET3 Anemia work up: No results for input(s): VITAMINB12, FOLATE, FERRITIN, TIBC, IRON, RETICCTPCT in the last 72 hours. Sepsis Labs: Recent Labs  Lab 03/11/19 0713 03/12/19 0358 03/13/19 1540 03/14/19 0705  WBC 15.7* 11.3* 9.1 8.5  Microbiology No results found for this or any previous visit (from the past 240 hour(s)).   Medications:   . allopurinol  100 mg Oral Daily  . amiodarone  200 mg Oral Daily  . bictegravir-emtricitabine-tenofovir AF  1 tablet Oral Daily  . brimonidine  1 drop Left Eye BID  . Chlorhexidine Gluconate Cloth  6 each Topical Q0600  . dorzolamide-timolol  1 drop Left Eye BID  . ferric citrate  420 mg Oral TID WC  . heparin  5,800 Units Dialysis Once in dialysis  . latanoprost  1 drop Left Eye QHS  . loratadine  10 mg Oral Daily  . midodrine  10 mg Oral 2 times per day on Tue Thu Sat  . midodrine  5 mg Oral Once per day on Sun Mon Wed Fri  . multivitamin  1 tablet Oral Daily  . Vitamin D (Ergocalciferol)  50,000 Units Oral Q Fri   Continuous Infusions: . sodium chloride    . sodium chloride    . sodium chloride 250 mL (03/15/19 0451)  . piperacillin-tazobactam (ZOSYN)  IV 3.375 g (03/15/19 0442)      LOS: 6 days   Charlynne Cousins  [facility]  03/15/2019, 9:40 AM

## 2019-03-15 NOTE — Progress Notes (Addendum)
[facility] Progress Note   Chief Complaint:   Abdominal pain / abnormal CT scan    SUBJECTIVE:    no complaints. No significant abdominal pain today.    ASSESSMENT AND PLAN:  60. 78 yo male with ESRD, chronic AFib, nonischemic cardiomyopathy found to have severe ischemic colitis involving entire cecum and ICV. Biopsies pending -WBC has normalized. Continue IV antibiotics.  -CT angio of abdomen already ordered to evaluated for vascular lesion but could be non-occlusive (cardiomyopathy, ESRD on HD)  -Vascular Surgery has been consulted.   2. HIV on [name]  3. Mild macrocytic anemia, stable. normal folate / B12.Not iron deficient.  4. Intermittent numbness of both feet   OBJECTIVE:     Vital signs in last 24 hours: Temp:  [97.6 F (36.4 C)-98.4 F (36.9 C)] 97.6 F (36.4 C) (04/11 0919) Pulse Rate:  [72-79] 76 (04/11 0919) Resp:  [16-22] 18 (04/11 0919) BP: (90-118)/(50-75) 118/61 (04/11 0919) SpO2:  [97 %-100 %] 99 % (04/11 0919) Weight:  [77.6 kg] 77.6 kg (04/11 0616) Last BM Date: 03/12/19 General:   Alert, well-developed male in NAD EENT:  Normal hearing, non icteric sclera, conjunctive pink.  Heart:  Regular rate and rhythm Pulm: Normal respiratory effort Abdomen:  Soft, nondistended, mild RLQ tenderness.  Normal bowel sounds.       Neurologic:  Alert and  oriented x4;  grossly normal neurologically. Psych:  Pleasant, cooperative.  Normal mood and affect.   Intake/Output from previous day: 04/10 0701 - 04/11 0700 In: 1109.6 [P.O.:640; I.V.:417; IV Piggyback:52.6] Out: 0  Intake/Output this shift: No intake/output data recorded.  Lab Results: Recent Labs    03/13/19 1540 03/14/19 0705  WBC 9.1 8.5  HGB 11.7* 11.2*  HCT 36.0* 33.3*  PLT 302 279   BMET Recent Labs    03/13/19 1540 03/14/19 0705  NA 137 140  K 3.7 4.0  CL 95* 99  CO2 26 23  GLUCOSE 105* 86  BUN 27* 39*  CREATININE 6.54* 8.88*  CALCIUM 8.8* 9.0   LFT  Recent Labs    03/13/19 1540  ALBUMIN 2.9*   PT/INR Recent Labs    03/14/19 0705 03/15/19 0215  LABPROT 23.8* 25.7*  INR 2.2* 2.4*   Hepatitis Panel No results for input(s): HEPBSAG, HCVAB, HEPAIGM, HEPBIGM in the last 72 hours.  No results found.    Principal Problem:   Colitis presumed infectious Active Problems:   ADENOCARCINOMA, PROSTATE, GLEASON GRADE 3   Hyperlipidemia   Nonischemic dilated cardiomyopathy (HCC)   ESRD on hemodialysis (HCC)   HIV (human immunodeficiency virus infection) (HCC)   Atrial fibrillation, chronic   Chronic anticoagulation   CAD (coronary artery disease)   Gastroesophageal reflux disease   Ischemic colitis ([place])   LOS: 6 days   Tye Savoy ,NP 03/15/2019, 10:15 AM   GI ATTENDING  Interval history data reviewed.  Patient seen and examined.  Agree with interval progress note as outlined.  Still slightly tender in the right lower quadrant but otherwise doing well.  Tolerating diet.  CTA was negative for occlusive disease of the SMA.  I am not surprised as this is almost certainly nonobstructive mesenteric ischemia (NOMI) with associated vascular spasm in patients with low flow state such as cardiomyopathy.  Sometimes best diagnosed with selective mesenteric angiogram and treated with vasodilators such as papaverine.  However, at this point, I think that his acute insult has passed and I would not proceed with angiography at this time.  Continue antibiotics until tenderness resolved.  Advance diet as tolerated.  Should the patient regress (worsening abdominal pain, fever) then urgent general surgical opinion recommended.  No further specific recommendations from GI medicine.  Please call if you have questions.  We will sign off.  Thank you.  Docia Chuck. Geri Seminole., M.D. [facility] Division of Gastroenterology

## 2019-03-15 NOTE — Progress Notes (Signed)
[facility] Progress Note   Dialysis Orders: [facility] TTS4h 2/2 bath 80.5kg400/800 P2 RUA AVG -Hep 5800units IV TIW -Calcitriol 0.34mcg PO TIW  Assessment/Plan: 1.Acute colitis. CT abd -thick walled cecum. Colonoscopyrevealed severe ischemic insult to entire cecum and ileocecal valve. Long term prognosis less favorable as this is associated with perforation. Bx pending Per primary/GI. Continues on Zosyn.  2. ESRD -HD TTS.HD04/10/2019 on schedule for today - hold heparin for now  3. HTN/volume-HD 04/09 Pre wt 80.2 kg Net UF 373cc Post wt 78.8.  Midodrine 10 mgmid run HD.Will need lower EDW on DC.Minimal volume removal with HD tomorrow. Avoid BP drop 4. Anemia-Hgb 11.2.No ESA needs. Mircera 75 last dosed 3/26 as outpatient. 5.Metabolic Bone Disease- Continue binders, decreased VDRA to 0.25 mcg PO TIW- corrected Ca had been elevated but he is also on 50K vit D weekly which can be contributory 6. P Afib. On amiodarone, warfarin per pharmacy INR at goal 7. NICM EF 25% 8. HIV. Continue usual meds 9. Nutrition: Albumin 2.9.Fullliquids. Prostat, renal vits- advance diet as able 10. Adenocarcinoma prostate, gleason grade 3.  Myriam Jacobson, PA-C [facility] Beeper [phone] 03/15/2019,9:34 AM  LOS: 6 days   Subjective:   Lab here to draw LDH and lactic acid - have deferred to have drawn with HD labs today.  No c/o  Objective Vitals:   03/14/19 1955 03/15/19 0424 03/15/19 0616 03/15/19 0919  BP: 97/63 116/68  118/61  Pulse: 77 72  76  Resp: 16   18  Temp: 98.4 F (36.9 C) 97.6 F (36.4 C)  97.6 F (36.4 C)  TempSrc: Oral Oral  Oral  SpO2: 97% 100%  99%  Weight:   77.6 kg   Height:       Physical Exam General: NAD on room air Heart: RRR Lungs: no rales Abdomen: soft NTND dim BS Extremities: no LE edema Dialysis Access:  Right upper AVGG + bruit   Additional Objective Labs: Basic Metabolic Panel: Recent Labs  Lab  03/11/19 0713 03/12/19 0358 03/13/19 1540 03/14/19 0705  NA 137 137 137 140  K 5.1 4.2 3.7 4.0  CL 89* 94* 95* 99  CO2 24 26 26 23   GLUCOSE 109* 115* 105* 86  BUN 92* 43* 27* 39*  CREATININE 13.00* 8.34* 6.54* 8.88*  CALCIUM 9.4 9.2 8.8* 9.0  PHOS 11.1*  --  5.9*  --    Liver Function Tests: Recent Labs  Lab 03/08/19 2218 03/09/19 0631 03/11/19 0713 03/13/19 1540  AST 30 22  --   --   ALT 26 23  --   --   ALKPHOS 93 86  --   --   BILITOT 0.7 0.9  --   --   PROT 8.6* 8.0  --   --   ALBUMIN 4.2 3.9 3.0* 2.9*   Recent Labs  Lab 03/08/19 2218  LIPASE 55*   CBC: Recent Labs  Lab 03/08/19 2218 03/09/19 0631 03/11/19 0713 03/12/19 0358 03/13/19 1540 03/14/19 0705  WBC 11.7* 13.1* 15.7* 11.3* 9.1 8.5  NEUTROABS 9.1*  --   --   --   --   --   HGB 13.9 12.2* 11.0* 11.4* 11.7* 11.2*  HCT 42.5 37.8* 33.8* 35.8*  34.3* 36.0* 33.3*  MCV 110.4* 107.4* 106.3* 106.5* 106.5* 105.7*  PLT 205 209 223 276 302 279   Blood Culture    Component Value Date/Time   SDES BLOOD LEFT FOREARM 02/11/2019 0030   SPECREQUEST  02/11/2019 0030  BOTTLES DRAWN AEROBIC AND ANAEROBIC Blood Culture results may not be optimal due to an inadequate volume of blood received in culture bottles   CULT  02/11/2019 0030    NO GROWTH 5 DAYS Performed at [facility], [place] [address], [place], [place] [address]    REPTSTATUS 02/16/2019 FINAL 02/11/2019 0030    Cardiac Enzymes: No results for input(s): CKTOTAL, CKMB, CKMBINDEX, TROPONINI in the last 168 hours. CBG: No results for input(s): GLUCAP in the last 168 hours. Iron Studies: No results for input(s): IRON, TIBC, TRANSFERRIN, FERRITIN in the last 72 hours. Lab Results  Component Value Date   INR 2.4 (H) 03/15/2019   INR 2.2 (H) 03/14/2019   INR 2.7 (H) 03/13/2019   Studies/Results: No results found. Medications: . sodium chloride    . sodium chloride    . sodium chloride 250 mL (03/15/19 0451)  . piperacillin-tazobactam  (ZOSYN)  IV 3.375 g (03/15/19 0442)   . allopurinol  100 mg Oral Daily  . amiodarone  200 mg Oral Daily  . bictegravir-emtricitabine-tenofovir AF  1 tablet Oral Daily  . brimonidine  1 drop Left Eye BID  . Chlorhexidine Gluconate Cloth  6 each Topical Q0600  . dorzolamide-timolol  1 drop Left Eye BID  . ferric citrate  420 mg Oral TID WC  . heparin  5,800 Units Dialysis Once in dialysis  . latanoprost  1 drop Left Eye QHS  . loratadine  10 mg Oral Daily  . midodrine  10 mg Oral 2 times per day on Tue Thu Sat  . midodrine  5 mg Oral Once per day on Sun Mon Wed Fri  . multivitamin  1 tablet Oral Daily  . Vitamin D (Ergocalciferol)  50,000 Units Oral Q Fri

## 2019-03-16 ENCOUNTER — Encounter (HOSPITAL_COMMUNITY): Payer: Self-pay | Admitting: Internal Medicine

## 2019-03-16 LAB — PROTIME-INR
INR: 2.2 — ABNORMAL HIGH (ref 0.8–1.2)
Prothrombin Time: 23.7 seconds — ABNORMAL HIGH (ref 11.4–15.2)

## 2019-03-16 NOTE — Progress Notes (Signed)
[facility] PROGRESS NOTE    Progress Note  [facility]  HGD:[id number] DOB: 28-Dec-1940 DOA: 03/08/2019 PCP: [name], NP     Brief Narrative:   David [name] is an 78 y.o. 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 Thursdays and Saturdays, history of combined systolic and diastolic heart failure, HIV prostate cancer who came in with a right lower quadrant abdominal pain seated with nausea and vomiting, CT scan of the abdomen and pelvis shows cecal inflammation with colitis, admitted to the hospital started on broad-spectrum antibiotics  Assessment/Plan:   Acute Ischemic Colitis He has completed 7-day course of IV antibiotics will discontinue Zosyn. GI has been consulted for colonoscopy performed on 03/14/2019 that showed severe ischemic colitis, GI recommended vascular consultation.   We will get a CT Angio of the abdomen pending. INR which is 2.2 will continue to hold Coumadin. Consult vascular surgery commended conservative management. LDH 121 and lactic acid 1.0. We are caught in a bad situation as he does have an EF of 20% and his fluid balance is hard to control. If the CT Angie of the abdomen pelvis does not show any significant vascular lesion for revascularization does mean that this is nonocclusive, likely due to his cardiomyopathy and end-stage renal disease. Offer him to speak with palliative care which he has refused.  Chronic atrial fibrillation: His Coumadin was held on 03/10/2019, INR trending down. Cont to hold coumadin. Currently rate controlled.  End-stage renal disease: Continue dialysis per renal. Appears euvolemic.    HIV: Continue current home medications.  History of ADENOCARCINOMA, PROSTATE, GLEASON GRADE 3 Follow-up with oncology as an outpatient.  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   DVT prophylaxis:  coumadin Family Communication:none Disposition Plan/Barrier to D/C: As work-up for abdominal pain and colitis completed. Code Status:     Code Status Orders  (From admission, onward)         Start     Ordered   03/09/19 0339  Full code  Continuous     03/09/19 0338        Code Status History    Date Active Date Inactive Code Status Order ID Comments User Context   03/04/2019 0735 03/06/2019 2321 Full Code [id number]  Roxanne Mins, MD ED   02/11/2019 0203 02/12/2019 1524 Full Code [id number]  Vianne Bulls, MD ED   12/05/2018 0033 12/06/2018 2016 Full Code [id number]  Rise Patience, MD ED   11/01/2018 0918 11/02/2018 1340 Full Code [id number]  Karmen Bongo, MD ED   10/21/2018 2316 10/23/2018 1508 Full Code [id number]  Ivor Costa, MD ED   02/17/2018 0434 03/09/2018 1851 Full Code [id number]  Omar Person, NP ED   01/21/2018 0405 01/22/2018 1802 Full Code [id number]  Vianne Bulls, MD ED   11/24/2017 1934 12/01/2017 1621 Full Code [id number]  Omar Person, NP Inpatient   11/23/2017 0004 11/24/2017 1934 Full Code [id number]  Vianne Bulls, MD ED   05/12/2014 2022 05/15/2014 2106 Full Code [id number]  Marybelle Killings, MD Inpatient   05/02/2014 1642 05/12/2014 2022 Full Code [id number]  Geradine Girt, DO Inpatient        IV Access:    Peripheral IV   Procedures and diagnostic studies:   No results found.   Medical Consultants:    None.  Anti-Infectives:   None  Subjective:    [facility] relates his  abdominal pain is improved.  He tolerated his diet without any difficulties.  Objective:    Vitals:   03/15/19 2254 03/16/19 0037 03/16/19 0555 03/16/19 0910  BP: (!) 107/54 103/61 96/60 (!) 98/56  Pulse: 76 83 79 82  Resp: 16 20 16 18   Temp: 98.2 F (36.8 C) (!) 97.5 F (36.4 C) 98 F (36.7 C) 98.5 F (36.9 C)  TempSrc: Oral Oral Oral Oral  SpO2: 99% 99% 98% 99%  Weight: 78 kg     Height:        Intake/Output Summary (Last 24 hours) at  03/16/2019 0937 Last data filed at 03/16/2019 0826 Gross per 24 hour  Intake 1270 ml  Output 736 ml  Net 534 ml   Filed Weights   03/15/19 0616 03/15/19 1809 03/15/19 2254  Weight: 77.6 kg 79.4 kg 78 kg    Exam: General exam: In no acute distress. Respiratory system: Good air movement and clear to auscultation. Cardiovascular system: S1 & S2 heard, RRR. Gastrointestinal system: Abdomen is nondistended, soft and nontender.  Central nervous system: Alert and oriented. No focal neurological deficits. Extremities: No pedal edema. Skin: No rashes, lesions or ulcers Psychiatry: Judgement and insight appear normal. Mood & affect appropriate.    Data Reviewed:    Labs: Basic Metabolic Panel: Recent Labs  Lab 03/11/19 0713 03/12/19 0358 03/13/19 1540 03/14/19 0705 03/15/19 2113  NA 137 137 137 140 135  K 5.1 4.2 3.7 4.0 4.1  CL 89* 94* 95* 99 96*  CO2 24 26 26 23 22   GLUCOSE 109* 115* 105* 86 148*  BUN 92* 43* 27* 39* 59*  CREATININE 13.00* 8.34* 6.54* 8.88* 11.95*  CALCIUM 9.4 9.2 8.8* 9.0 8.8*  MG  --   --   --  2.5*  --   PHOS 11.1*  --  5.9*  --  8.7*   GFR Estimated Creatinine Clearance: 5.8 mL/min (A) (by C-G formula based on SCr of 11.95 mg/dL (H)). Liver Function Tests: Recent Labs  Lab 03/11/19 0713 03/13/19 1540 03/15/19 2113  ALBUMIN 3.0* 2.9* 2.5*   No results for input(s): LIPASE, AMYLASE in the last 168 hours. No results for input(s): AMMONIA in the last 168 hours. Coagulation profile Recent Labs  Lab 03/12/19 0358 03/13/19 0540 03/14/19 0705 03/15/19 0215 03/16/19 0542  INR 4.5* 2.7* 2.2* 2.4* 2.2*    CBC: Recent Labs  Lab 03/11/19 0713 03/12/19 0358 03/13/19 1540 03/14/19 0705 03/15/19 2121  WBC 15.7* 11.3* 9.1 8.5 8.3  HGB 11.0* 11.4* 11.7* 11.2* 10.9*  HCT 33.8* 35.8*  34.3* 36.0* 33.3* 33.5*  MCV 106.3* 106.5* 106.5* 105.7* 105.3*  PLT 223 276 302 279 358   Cardiac Enzymes: No results for input(s): CKTOTAL, CKMB, CKMBINDEX,  TROPONINI in the last 168 hours. BNP (last 3 results) No results for input(s): PROBNP in the last 8760 hours. CBG: No results for input(s): GLUCAP in the last 168 hours. D-Dimer: No results for input(s): DDIMER in the last 72 hours. Hgb A1c: No results for input(s): HGBA1C in the last 72 hours. Lipid Profile: No results for input(s): CHOL, HDL, LDLCALC, TRIG, CHOLHDL, LDLDIRECT in the last 72 hours. Thyroid function studies: No results for input(s): TSH, T4TOTAL, T3FREE, THYROIDAB in the last 72 hours.  Invalid input(s): FREET3 Anemia work up: No results for input(s): VITAMINB12, FOLATE, FERRITIN, TIBC, IRON, RETICCTPCT in the last 72 hours. Sepsis Labs: Recent Labs  Lab 03/12/19 0358 03/13/19 1540 03/14/19 0705 03/15/19 1700 03/15/19 2121  WBC 11.3* 9.1 8.5  --  8.3  LATICACIDVEN  --   --   --  1.0  --    Microbiology No results found for this or any previous visit (from the past 240 hour(s)).   Medications:   . allopurinol  100 mg Oral Daily  . amiodarone  200 mg Oral Daily  . bictegravir-emtricitabine-tenofovir AF  1 tablet Oral Daily  . brimonidine  1 drop Left Eye BID  . Chlorhexidine Gluconate Cloth  6 each Topical Q0600  . dorzolamide-timolol  1 drop Left Eye BID  . feeding supplement (PRO-STAT SUGAR FREE 64)  30 mL Oral BID  . ferric citrate  420 mg Oral TID WC  . latanoprost  1 drop Left Eye QHS  . loratadine  10 mg Oral Daily  . midodrine  10 mg Oral 2 times per day on Tue Thu Sat  . midodrine  5 mg Oral Once per day on Sun Mon Wed Fri  . multivitamin  1 tablet Oral Daily  . Vitamin D (Ergocalciferol)  50,000 Units Oral Q Fri   Continuous Infusions: . sodium chloride    . sodium chloride    . sodium chloride 250 mL (03/15/19 0451)  . piperacillin-tazobactam (ZOSYN)  IV 3.375 g (03/16/19 0922)      LOS: 7 days   Charlynne Cousins  [facility]  03/16/2019, 9:37 AM

## 2019-03-16 NOTE — Progress Notes (Signed)
[facility] Progress Note   Dialysis Orders: [facility] TTS4h 2/2 bath 80.5kg400/800 P2 RUA AVG -Hep 5800units IV TIW -Calcitriol 0.71mcg PO TIW  Assessment/Plan: 1.Acute colitis. CT abd -thick walled cecum. Colonoscopyrevealed severe ischemic insult to entire cecum and ileocecal valve. Long term prognosis less favorable as this is associated with perforation. Bx pending Per primary/GI. Continues on Zosyn.  2. ESRD -HD TTS.- hold heparin for now - next HD Tuesday 3. HTN/volume-HD 04/09 Pre wt 80.2 kg Net UF 373cc Post wt 78.8.  net UF 4/11 736 with post wt  78Midodrine 10 mgmid run HD.Will need lower EDW on DC.Avoid BP drop given prior ischemic insult to colon - keep systolic BP > 782 4. Anemia-Hgb 10.9 No ESA needs yet. Mircera 75 last dosed 3/26 as outpatient. 5.Metabolic Bone Disease- Continue binders, decreased VDRA to 0.25 mcg PO TIW- corrected Ca had been elevated but he is also on 50K vit D weekly which can be contributory P up - / missing - intake variable - not a big issue for the short term 6. P Afib. On amiodarone, warfarin per pharmacy INR at goal - avoid low K on dialysis 7. NICM EF 25% 8. HIV. Continue usual meds 9. Nutrition: Albumin 2.9.Fullliquids. Prostat, renal vits- advance diet as able 10. Adenocarcinoma prostate, gleason grade 3.  David Jacobson, PA-C [place] [phone] 03/16/2019,9:32 AM  LOS: 7 days   Subjective:   No taste for food. Up in recliner - wants to get up and walk  Objective Vitals:   03/15/19 2254 03/16/19 0037 03/16/19 0555 03/16/19 0910  BP: (!) 107/54 103/61 96/60 (!) 98/56  Pulse: 76 83 79 82  Resp: 16 20 16 18   Temp: 98.2 F (36.8 C) (!) 97.5 F (36.4 C) 98 F (36.7 C) 98.5 F (36.9 C)  TempSrc: Oral Oral Oral Oral  SpO2: 99% 99% 98% 99%  Weight: 78 kg     Height:       Physical Exam General: NAD on room air Heart: RRR Lungs: no rales Abdomen: soft NTND dim  BS Extremities: no LE edema Dialysis Access:  Right upper AVGG + bruit   Additional Objective Labs: Basic Metabolic Panel: Recent Labs  Lab 03/11/19 0713  03/13/19 1540 03/14/19 0705 03/15/19 2113  NA 137   < > 137 140 135  K 5.1   < > 3.7 4.0 4.1  CL 89*   < > 95* 99 96*  CO2 24   < > 26 23 22   GLUCOSE 109*   < > 105* 86 148*  BUN 92*   < > 27* 39* 59*  CREATININE 13.00*   < > 6.54* 8.88* 11.95*  CALCIUM 9.4   < > 8.8* 9.0 8.8*  PHOS 11.1*  --  5.9*  --  8.7*   < > = values in this interval not displayed.   Liver Function Tests: Recent Labs  Lab 03/11/19 0713 03/13/19 1540 03/15/19 2113  ALBUMIN 3.0* 2.9* 2.5*   No results for input(s): LIPASE, AMYLASE in the last 168 hours. CBC: Recent Labs  Lab 03/11/19 0713 03/12/19 0358 03/13/19 1540 03/14/19 0705 03/15/19 2121  WBC 15.7* 11.3* 9.1 8.5 8.3  HGB 11.0* 11.4* 11.7* 11.2* 10.9*  HCT 33.8* 35.8*  34.3* 36.0* 33.3* 33.5*  MCV 106.3* 106.5* 106.5* 105.7* 105.3*  PLT 223 276 302 279 358   Blood Culture    Component Value Date/Time   SDES BLOOD LEFT FOREARM 02/11/2019 0030   SPECREQUEST  02/11/2019  0030    BOTTLES DRAWN AEROBIC AND ANAEROBIC Blood Culture results may not be optimal due to an inadequate volume of blood received in culture bottles   CULT  02/11/2019 0030    NO GROWTH 5 DAYS Performed at [facility] [address], [place], [place] [address]    REPTSTATUS 02/16/2019 FINAL 02/11/2019 0030    Cardiac Enzymes: No results for input(s): CKTOTAL, CKMB, CKMBINDEX, TROPONINI in the last 168 hours. CBG: No results for input(s): GLUCAP in the last 168 hours. Iron Studies: No results for input(s): IRON, TIBC, TRANSFERRIN, FERRITIN in the last 72 hours. Lab Results  Component Value Date   INR 2.2 (H) 03/16/2019   INR 2.4 (H) 03/15/2019   INR 2.2 (H) 03/14/2019   Studies/Results: No results found. Medications: . sodium chloride    . sodium chloride    . sodium chloride 250 mL  (03/15/19 0451)  . piperacillin-tazobactam (ZOSYN)  IV 3.375 g (03/16/19 0922)   . allopurinol  100 mg Oral Daily  . amiodarone  200 mg Oral Daily  . bictegravir-emtricitabine-tenofovir AF  1 tablet Oral Daily  . brimonidine  1 drop Left Eye BID  . Chlorhexidine Gluconate Cloth  6 each Topical Q0600  . dorzolamide-timolol  1 drop Left Eye BID  . feeding supplement (PRO-STAT SUGAR FREE 64)  30 mL Oral BID  . ferric citrate  420 mg Oral TID WC  . latanoprost  1 drop Left Eye QHS  . loratadine  10 mg Oral Daily  . midodrine  10 mg Oral 2 times per day on Tue Thu Sat  . midodrine  5 mg Oral Once per day on Sun Mon Wed Fri  . multivitamin  1 tablet Oral Daily  . Vitamin D (Ergocalciferol)  50,000 Units Oral Q Fri

## 2019-03-17 ENCOUNTER — Inpatient Hospital Stay (HOSPITAL_COMMUNITY): Payer: Medicare HMO

## 2019-03-17 LAB — PROTIME-INR
INR: 1.7 — ABNORMAL HIGH (ref 0.8–1.2)
Prothrombin Time: 19.9 seconds — ABNORMAL HIGH (ref 11.4–15.2)

## 2019-03-17 MED ORDER — MIDODRINE HCL 5 MG PO TABS
10.0000 mg | ORAL_TABLET | ORAL | Status: DC
  Administered 2019-03-17: 10 mg via ORAL
  Filled 2019-03-17: qty 2

## 2019-03-17 MED ORDER — WARFARIN SODIUM 5 MG PO TABS
5.0000 mg | ORAL_TABLET | Freq: Once | ORAL | Status: AC
  Administered 2019-03-17: 5 mg via ORAL
  Filled 2019-03-17: qty 1

## 2019-03-17 MED ORDER — WARFARIN - PHARMACIST DOSING INPATIENT: Freq: Every day | Status: DC

## 2019-03-17 NOTE — Care Management Important Message (Signed)
Important Message  Patient Details  Name: David [name] MRN: [id number] Date of Birth: 04/27/1941   Medicare Important Message Given:  Yes    Fortunato Nordin [name] 03/17/2019, 4:28 PM

## 2019-03-17 NOTE — Progress Notes (Signed)
[facility] PROGRESS NOTE    Progress Note  [facility]  POE:[id number] DOB: 28-Sep-1941 DOA: 03/08/2019 PCP: [name], NP     Brief Narrative:   David [name] is an 78 y.o. 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 Thursdays and Saturdays, history of combined systolic and diastolic heart failure, HIV prostate cancer who came in with a right lower quadrant abdominal pain seated with nausea and vomiting, CT scan of the abdomen and pelvis shows cecal inflammation with colitis, admitted to the hospital started on broad-spectrum antibiotics  Assessment/Plan:   Acute Ischemic Colitis He has completed 7-day course of IV antibiotics will discontinue Zosyn. GI has been consulted for colonoscopy performed on 03/14/2019 that showed severe ischemic colitis, GI recommended vascular consultation.   CT Angio of the abdomen show no significant stenosis in the abdominal vasculature.  Did show wall thickening and inflammatory changes of the cecum but no evidence of perforation.  There were several lesions throughout the kidneys are was a left adrenal nodule. Consult vascular surgery commended conservative management. We are caught in a bad situation as he does have an EF of 20% and his fluid balance is hard  to control, in a patient with end-stage renal disease. The likelihood of this acute ischemic episode was likely due to dialysis, as he relate he was getting abdominal pain right after dialysis. Patient refused again to speak to palliative care.  Chronic atrial fibrillation: Controlled resume Coumadin.  End-stage renal disease: Continue dialysis per renal. Appears euvolemic.    HIV: Continue current home medications.  History of ADENOCARCINOMA, PROSTATE, GLEASON GRADE 3 Follow-up with oncology as an outpatient.  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   Possible undescended testicle Check an abdominal ultrasound.  DVT prophylaxis: coumadin Family Communication:none Disposition Plan/Barrier to D/C: Hopefully home in the morning. Code Status:     Code Status Orders  (From admission, onward)         Start     Ordered   03/09/19 0339  Full code  Continuous     03/09/19 0338        Code Status History    Date Active Date Inactive Code Status Order ID Comments User Context   03/04/2019 0735 03/06/2019 2321 Full Code [id number]  Roxanne Mins, MD ED   02/11/2019 0203 02/12/2019 1524 Full Code [id number]  Vianne Bulls, MD ED   12/05/2018 0033 12/06/2018 2016 Full Code [id number]  Rise Patience, MD ED   11/01/2018 0918 11/02/2018 1340 Full Code [id number]  Karmen Bongo, MD ED   10/21/2018 2316 10/23/2018 1508 Full Code [id number]  Ivor Costa, MD ED   02/17/2018 0434 03/09/2018 1851 Full Code [id number]  Omar Person, NP ED   01/21/2018 0405 01/22/2018 1802 Full Code [id number]  Vianne Bulls, MD ED   11/24/2017 1934 12/01/2017 1621 Full Code [id number]  Omar Person, NP Inpatient   11/23/2017 0004 11/24/2017 1934 Full Code [id number]  Vianne Bulls, MD ED   05/12/2014 2022 05/15/2014 2106 Full Code [id number]  Marybelle Killings, MD Inpatient   05/02/2014 1642 05/12/2014 2022 Full Code [id number]  Geradine Girt, DO Inpatient        IV Access:    Peripheral IV   Procedures and diagnostic studies:   Ct Angio Abd/pel W/ And/or W/o  Result Date: 03/16/2019 CLINICAL DATA:  Suspected ischemic colitis EXAM: CTA ABDOMEN AND PELVIS  wITHOUT AND WITH CONTRAST TECHNIQUE: Multidetector CT imaging of the abdomen and pelvis was performed using the standard protocol during bolus administration of intravenous contrast. Multiplanar reconstructed images and MIPs were obtained and reviewed to evaluate the vascular anatomy. CONTRAST:  129mL OMNIPAQUE IOHEXOL 350 MG/ML SOLN COMPARISON:  None. FINDINGS: VASCULAR Aorta: Nonaneurysmal and patent with  mild scattered atherosclerotic calcifications. 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 Branch vessels patent. SMA: Patent. There is hypertrophy of pancreatic branches which provide collateral flow to the celiac axis territory. Right colic and ileocolic branches are grossly patent. Renals: Single renal arteries are patent. IMA: Patent and diminutive. Inflow: Bilateral common, internal, and external iliac arteries are patent with scattered atherosclerotic plaque. Proximal Outflow: Grossly patent. Veins: No evidence of DVT. Review of the MIP images confirms the above findings. NON-VASCULAR Lower chest: Dependent atelectasis. Cardiomegaly with severe dilatation of the left ventricle. Minimal coronary artery calcification. Hepatobiliary: Unremarkable appearance of the liver. Sludge in the gallbladder. Pancreas: Unremarkable Spleen: Unremarkable Adrenals/Urinary Tract: Several lesions scattered throughout both kidneys are noted. Some of these are hyperdense and a small renal cell carcinoma cannot be excluded. There is a 1.1 cm nonspecific nodule in the left adrenal gland. Bladder is decompressed. Stomach/Bowel: Wall thickening and adjacent inflammatory changes involving the cecum have worsened. There is no extraluminal bowel gas to suggest perforation. There is no evidence of abscess. The appendix is within normal limits and best visualized on sagittal reconstruction imaging. Stomach and small bowel are decompressed. There is no evidence of wall thickening throughout the remainder of the colon. Lymphatic: No abnormal retroperitoneal adenopathy. Reproductive: Brachy therapy pellets in the prostate gland. Soft tissue density in the distal right inguinal region is noted. Undescended testicle is not excluded. Other: No free fluid. Musculoskeletal: No free-fluid. IMPRESSION: VASCULAR Although there is narrowing in the celiac axis due to median arcuate ligament syndrome, the day SMA and IMA are patent.  Furthermore, the right and ileocolic branches of the SMA are also patent. NON-VASCULAR Wall thickening and inflammatory changes at the cecum have worsened. There is no evidence of perforation. Cardiomegaly with severe dilatation of the left ventricle. There are several lesions throughout the kidneys. Small renal cell carcinoma cannot be excluded. Left adrenal nodule is nonspecific. If there is a history of malignancy or risk of malignancy, six-month follow-up MRI is recommended. Possible undescended right testicle.  Ultrasound is recommended. Electronically Signed   By: Marybelle Killings M.D.   On: 03/16/2019 09:58     Medical Consultants:    None.  Anti-Infectives:   None  Subjective:    [facility] relates his abdominal pain is improved.  He tolerated his diet without any difficulties.  Objective:    Vitals:   03/16/19 1800 03/16/19 2114 03/17/19 0530 03/17/19 0921  BP: (!) 84/69 98/68 119/67 (!) 92/57  Pulse: 77 79 75 87  Resp: 18 16 16 20   Temp: 98.3 F (36.8 C) 98.5 F (36.9 C) 98.6 F (37 C) 97.6 F (36.4 C)  TempSrc: Oral   Oral  SpO2: 98% 100% 99% 98%  Weight:  78 kg    Height:        Intake/Output Summary (Last 24 hours) at 03/17/2019 0928 Last data filed at 03/17/2019 0535 Gross per 24 hour  Intake 120 ml  Output 0 ml  Net 120 ml   Filed Weights   03/15/19 1809 03/15/19 2254 03/16/19 2114  Weight: 79.4 kg 78 kg 78 kg    Exam: General exam: In  no acute distress. Respiratory system: Good air movement and clear to auscultation. Cardiovascular system: S1 & S2 heard, RRR. Gastrointestinal system: Abdomen is nondistended, soft and nontender.  Central nervous system: Alert and oriented. No focal neurological deficits. Extremities: No pedal edema. Skin: No rashes, lesions or ulcers Psychiatry: Judgement and insight appear normal. Mood & affect appropriate.    Data Reviewed:    Labs: Basic Metabolic Panel: Recent Labs  Lab 03/11/19 0713 03/12/19  0358 03/13/19 1540 03/14/19 0705 03/15/19 2113  NA 137 137 137 140 135  K 5.1 4.2 3.7 4.0 4.1  CL 89* 94* 95* 99 96*  CO2 24 26 26 23 22   GLUCOSE 109* 115* 105* 86 148*  BUN 92* 43* 27* 39* 59*  CREATININE 13.00* 8.34* 6.54* 8.88* 11.95*  CALCIUM 9.4 9.2 8.8* 9.0 8.8*  MG  --   --   --  2.5*  --   PHOS 11.1*  --  5.9*  --  8.7*   GFR Estimated Creatinine Clearance: 5.8 mL/min (A) (by C-G formula based on SCr of 11.95 mg/dL (H)). Liver Function Tests: Recent Labs  Lab 03/11/19 0713 03/13/19 1540 03/15/19 2113  ALBUMIN 3.0* 2.9* 2.5*   No results for input(s): LIPASE, AMYLASE in the last 168 hours. No results for input(s): AMMONIA in the last 168 hours. Coagulation profile Recent Labs  Lab 03/13/19 0540 03/14/19 0705 03/15/19 0215 03/16/19 0542 03/17/19 0509  INR 2.7* 2.2* 2.4* 2.2* 1.7*    CBC: Recent Labs  Lab 03/11/19 0713 03/12/19 0358 03/13/19 1540 03/14/19 0705 03/15/19 2121  WBC 15.7* 11.3* 9.1 8.5 8.3  HGB 11.0* 11.4* 11.7* 11.2* 10.9*  HCT 33.8* 35.8*  34.3* 36.0* 33.3* 33.5*  MCV 106.3* 106.5* 106.5* 105.7* 105.3*  PLT 223 276 302 279 358   Cardiac Enzymes: No results for input(s): CKTOTAL, CKMB, CKMBINDEX, TROPONINI in the last 168 hours. BNP (last 3 results) No results for input(s): PROBNP in the last 8760 hours. CBG: No results for input(s): GLUCAP in the last 168 hours. D-Dimer: No results for input(s): DDIMER in the last 72 hours. Hgb A1c: No results for input(s): HGBA1C in the last 72 hours. Lipid Profile: No results for input(s): CHOL, HDL, LDLCALC, TRIG, CHOLHDL, LDLDIRECT in the last 72 hours. Thyroid function studies: No results for input(s): TSH, T4TOTAL, T3FREE, THYROIDAB in the last 72 hours.  Invalid input(s): FREET3 Anemia work up: No results for input(s): VITAMINB12, FOLATE, FERRITIN, TIBC, IRON, RETICCTPCT in the last 72 hours. Sepsis Labs: Recent Labs  Lab 03/12/19 0358 03/13/19 1540 03/14/19 0705 03/15/19 1700  03/15/19 2121  WBC 11.3* 9.1 8.5  --  8.3  LATICACIDVEN  --   --   --  1.0  --    Microbiology No results found for this or any previous visit (from the past 240 hour(s)).   Medications:   . allopurinol  100 mg Oral Daily  . amiodarone  200 mg Oral Daily  . bictegravir-emtricitabine-tenofovir AF  1 tablet Oral Daily  . brimonidine  1 drop Left Eye BID  . Chlorhexidine Gluconate Cloth  6 each Topical Q0600  . dorzolamide-timolol  1 drop Left Eye BID  . feeding supplement (PRO-STAT SUGAR FREE 64)  30 mL Oral BID  . ferric citrate  420 mg Oral TID WC  . latanoprost  1 drop Left Eye QHS  . loratadine  10 mg Oral Daily  . midodrine  10 mg Oral 2 times per day on Tue Thu Sat  . midodrine  5  mg Oral Once per day on Sun Mon Wed Fri  . multivitamin  1 tablet Oral Daily  . Vitamin D (Ergocalciferol)  50,000 Units Oral Q Fri   Continuous Infusions: . sodium chloride    . sodium chloride    . sodium chloride 250 mL (03/15/19 0451)      LOS: 8 days   Charlynne Cousins  [facility]  03/17/2019, 9:28 AM

## 2019-03-17 NOTE — Evaluation (Signed)
Physical Therapy Evaluation Patient Details Name: David [name] MRN: [id number] DOB: Feb 08, 1941 Today's Date: 03/17/2019   History of Present Illness  [facility] Passey is an 78 y.o. 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 Thursdays and Saturdays, history of combined systolic and diastolic heart failure, HIV prostate cancer who came in with a right lower quadrant abdominal pain seated with nausea and vomiting, CT scan of the abdomen and pelvis shows cecal inflammation with colitis, admitted to the hospital started on broad-spectrum antibiotics  Clinical Impression   Pt admitted with above diagnosis. Pt currently with functional limitations due to the deficits listed below (see PT Problem List). Walking independently with cane prior to recent hospitalization (the one before this one); Presents with gait and balance dysfunction, decr activity tolerance;  Pt will benefit from skilled PT to increase their independence and safety with mobility to allow discharge to the venue listed below.       Follow Up Recommendations Home health PT    Equipment Recommendations  None recommended by PT    Recommendations for Other Services       Precautions / Restrictions Precautions Precautions: Fall      Mobility  Bed Mobility Overal bed mobility: Needs Assistance Bed Mobility: Supine to Sit     Supine to sit: Supervision     General bed mobility comments: Incr time and used rail to push up  Transfers Overall transfer level: Needs assistance Equipment used: Rolling walker (2 wheeled) Transfers: Sit to/from Stand Sit to Stand: Min assist         General transfer comment: light min assist to stand  Ambulation/Gait Ambulation/Gait assistance: Min guard Gait Distance (Feet): 80 Feet Assistive device: Rolling walker (2 wheeled) Gait Pattern/deviations: Step-through pattern;Decreased stride length Gait velocity: decr   General Gait Details:  Cues for upright posture and RW proximity; bil hips slightly flexed  Stairs            Wheelchair Mobility    Modified Rankin (Stroke Patients Only)       Balance     Sitting balance-Leahy Scale: Fair       Standing balance-Leahy Scale: Poor(approaching Fair)                               Pertinent Vitals/Pain Pain Assessment: No/denies pain    Home Living Family/patient expects to be discharged to:: Private residence Living Arrangements: Spouse/significant other;Children Available Help at Discharge: Family Type of Home: House Home Access: Stairs to enter Entrance Stairs-Rails: Right Entrance Stairs-Number of Steps: 3   Home Equipment: Environmental consultant - 2 wheels;Cane - single point      Prior Function Level of Independence: Independent with assistive device(s)         Comments: modI with SPC; prior to most recent admission     Hand Dominance   Dominant Hand: Right    Extremity/Trunk Assessment   Upper Extremity Assessment Upper Extremity Assessment: Overall WFL for tasks assessed    Lower Extremity Assessment Lower Extremity Assessment: Generalized weakness    Cervical / Trunk Assessment Cervical / Trunk Assessment: Normal  Communication   Communication: No difficulties  Cognition Arousal/Alertness: Awake/alert Behavior During Therapy: WFL for tasks assessed/performed Overall Cognitive Status: Within Functional Limits for tasks assessed  General Comments      Exercises     Assessment/Plan    PT Assessment Patient needs continued PT services  PT Problem List Decreased strength;Decreased activity tolerance;Decreased balance;Decreased mobility       PT Treatment Interventions DME instruction;Gait training;Stair training;Functional mobility training;Therapeutic exercise;Therapeutic activities;Balance training;Patient/family education    PT Goals (Current goals can be found in  the Care Plan section)  Acute Rehab PT Goals Patient Stated Goal: "go home tomorrow." PT Goal Formulation: With patient Time For Goal Achievement: 03/31/19 Potential to Achieve Goals: Good    Frequency Min 3X/week   Barriers to discharge        Co-evaluation               AM-PAC PT "6 Clicks" Mobility  Outcome Measure Help needed turning from your back to your side while in a flat bed without using bedrails?: None Help needed moving from lying on your back to sitting on the side of a flat bed without using bedrails?: A Little Help needed moving to and from a bed to a chair (including a wheelchair)?: A Little Help needed standing up from a chair using your arms (e.g., wheelchair or bedside chair)?: A Little Help needed to walk in hospital room?: A Little Help needed climbing 3-5 steps with a railing? : A Little 6 Click Score: 19    End of Session Equipment Utilized During Treatment: Gait belt Activity Tolerance: Patient tolerated treatment well Patient left: in bed;Other (comment)(about to transport to ultrasound) Nurse Communication: Mobility status PT Visit Diagnosis: Unsteadiness on feet (R26.81);Difficulty in walking, not elsewhere classified (R26.2)    Time: 3903-0092 PT Time Calculation (min) (ACUTE ONLY): 18 min   Charges:   PT Evaluation $PT Eval Moderate Complexity: 1 Mod          Roney [place], [place]  Acute Rehabilitation Services Pager [phone] Office [phone]   Colletta [place] 03/17/2019, 12:33 PM

## 2019-03-17 NOTE — Progress Notes (Signed)
ANTICOAGULATION CONSULT NOTE - Follow Up Consult  Pharmacy Consult for Coumadin Indication:  Afib  Allergies  Allergen Reactions  . Cozaar [Losartan Potassium] Other (See Comments)    Causes constipation    Vital Signs: Temp: 97.6 F (36.4 C) (04/13 0921) Temp Source: Oral (04/13 0921) BP: 92/57 (04/13 0921) Pulse Rate: 87 (04/13 0921)  Labs: Recent Labs    03/15/19 0215 03/15/19 2113 03/15/19 2121 03/16/19 0542 03/17/19 0509  HGB  --   --  10.9*  --   --   HCT  --   --  33.5*  --   --   PLT  --   --  358  --   --   LABPROT 25.7*  --   --  23.7* 19.9*  INR 2.4*  --   --  2.2* 1.7*  CREATININE  --  11.95*  --   --   --     Estimated Creatinine Clearance: 5.8 mL/min (A) (by C-G formula based on SCr of 11.95 mg/dL (H)).  Assessment: 78 yo M with Afib on Coumadin 5mg  daily PTA-held for colonoscopy on 4/10. Coumadin remains on hold per MD. INR down to 1.7 today and now to restart. Hgb 10.9, pltc wnl  Goal of Therapy:  INR 2.0-3.0  Plan:  Restart Coumadin at 5mg  PO daily Monitor daily INR, CBC, s/s of bleed  Keliah Harned J 03/17/2019,9:40 AM

## 2019-03-17 NOTE — Progress Notes (Addendum)
[facility] Progress Note   Subjective: Says he doesn't have abdominal pain unless someone presses too hard. Wants to get up and walk before he goes home. Refusing palliative care.    Objective Vitals:   03/16/19 1800 03/16/19 2114 03/17/19 0530 03/17/19 0921  BP: (!) 84/69 98/68 119/67 (!) 92/57  Pulse: 77 79 75 87  Resp: 18 16 16 20   Temp: 98.3 F (36.8 C) 98.5 F (36.9 C) 98.6 F (37 C) 97.6 F (36.4 C)  TempSrc: Oral   Oral  SpO2: 98% 100% 99% 98%  Weight:  78 kg    Height:       Physical Exam General:WN, WD older male in NAD Heart:S1,S2, RRR Lungs:CTAB A/P Abdomen:Tenderness lower quadrants of abd Extremities:No LE edema. Dry scaly skin BLE Dialysis Access:R AVG+ T/B  Additional Objective Labs: Basic Metabolic Panel: Recent Labs  Lab 03/11/19 0713  03/13/19 1540 03/14/19 0705 03/15/19 2113  NA 137   < > 137 140 135  K 5.1   < > 3.7 4.0 4.1  CL 89*   < > 95* 99 96*  CO2 24   < > 26 23 22   GLUCOSE 109*   < > 105* 86 148*  BUN 92*   < > 27* 39* 59*  CREATININE 13.00*   < > 6.54* 8.88* 11.95*  CALCIUM 9.4   < > 8.8* 9.0 8.8*  PHOS 11.1*  --  5.9*  --  8.7*   < > = values in this interval not displayed.   Liver Function Tests: Recent Labs  Lab 03/11/19 0713 03/13/19 1540 03/15/19 2113  ALBUMIN 3.0* 2.9* 2.5*   No results for input(s): LIPASE, AMYLASE in the last 168 hours. CBC: Recent Labs  Lab 03/11/19 0713 03/12/19 0358 03/13/19 1540 03/14/19 0705 03/15/19 2121  WBC 15.7* 11.3* 9.1 8.5 8.3  HGB 11.0* 11.4* 11.7* 11.2* 10.9*  HCT 33.8* 35.8*  34.3* 36.0* 33.3* 33.5*  MCV 106.3* 106.5* 106.5* 105.7* 105.3*  PLT 223 276 302 279 358   Blood Culture    Component Value Date/Time   SDES BLOOD LEFT FOREARM 02/11/2019 0030   SPECREQUEST  02/11/2019 0030    BOTTLES DRAWN AEROBIC AND ANAEROBIC Blood Culture results may not be optimal due to an inadequate volume of blood received in culture bottles   CULT  02/11/2019 0030     NO GROWTH 5 DAYS Performed at [facility], [place] [address] [place],  [address]    REPTSTATUS 02/16/2019 FINAL 02/11/2019 0030    Cardiac Enzymes: No results for input(s): CKTOTAL, CKMB, CKMBINDEX, TROPONINI in the last 168 hours. CBG: No results for input(s): GLUCAP in the last 168 hours. Iron Studies: No results for input(s): IRON, TIBC, TRANSFERRIN, FERRITIN in the last 72 hours. @lablastinr3 @ Studies/Results: Ct Angio Abd/pel W/ And/or W/o  Result Date: 03/16/2019 CLINICAL DATA:  Suspected ischemic colitis EXAM: CTA ABDOMEN AND PELVIS wITHOUT AND WITH CONTRAST TECHNIQUE: Multidetector CT imaging of the abdomen and pelvis was performed using the standard protocol during bolus administration of intravenous contrast. Multiplanar reconstructed images and MIPs were obtained and reviewed to evaluate the vascular anatomy. CONTRAST:  136mL OMNIPAQUE IOHEXOL 350 MG/ML SOLN COMPARISON:  None. FINDINGS: VASCULAR Aorta: Nonaneurysmal and patent with mild scattered atherosclerotic calcifications. 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 Branch vessels patent. SMA: Patent. There is hypertrophy of pancreatic branches which provide collateral flow to the celiac axis territory. Right colic and ileocolic branches are grossly patent. Renals: Single renal  arteries are patent. IMA: Patent and diminutive. Inflow: Bilateral common, internal, and external iliac arteries are patent with scattered atherosclerotic plaque. Proximal Outflow: Grossly patent. Veins: No evidence of DVT. Review of the MIP images confirms the above findings. NON-VASCULAR Lower chest: Dependent atelectasis. Cardiomegaly with severe dilatation of the left ventricle. Minimal coronary artery calcification. Hepatobiliary: Unremarkable appearance of the liver. Sludge in the gallbladder. Pancreas: Unremarkable Spleen: Unremarkable Adrenals/Urinary Tract: Several lesions scattered throughout both kidneys are  noted. Some of these are hyperdense and a small renal cell carcinoma cannot be excluded. There is a 1.1 cm nonspecific nodule in the left adrenal gland. Bladder is decompressed. Stomach/Bowel: Wall thickening and adjacent inflammatory changes involving the cecum have worsened. There is no extraluminal bowel gas to suggest perforation. There is no evidence of abscess. The appendix is within normal limits and best visualized on sagittal reconstruction imaging. Stomach and small bowel are decompressed. There is no evidence of wall thickening throughout the remainder of the colon. Lymphatic: No abnormal retroperitoneal adenopathy. Reproductive: Brachy therapy pellets in the prostate gland. Soft tissue density in the distal right inguinal region is noted. Undescended testicle is not excluded. Other: No free fluid. Musculoskeletal: No free-fluid. IMPRESSION: VASCULAR Although there is narrowing in the celiac axis due to median arcuate ligament syndrome, the day SMA and IMA are patent. Furthermore, the right and ileocolic branches of the SMA are also patent. NON-VASCULAR Wall thickening and inflammatory changes at the cecum have worsened. There is no evidence of perforation. Cardiomegaly with severe dilatation of the left ventricle. There are several lesions throughout the kidneys. Small renal cell carcinoma cannot be excluded. Left adrenal nodule is nonspecific. If there is a history of malignancy or risk of malignancy, six-month follow-up MRI is recommended. Possible undescended right testicle.  Ultrasound is recommended. Electronically Signed   By: Marybelle Killings M.D.   On: 03/16/2019 09:58   Medications: . sodium chloride    . sodium chloride    . sodium chloride 250 mL (03/15/19 0451)   . allopurinol  100 mg Oral Daily  . amiodarone  200 mg Oral Daily  . bictegravir-emtricitabine-tenofovir AF  1 tablet Oral Daily  . brimonidine  1 drop Left Eye BID  . Chlorhexidine Gluconate Cloth  6 each Topical Q0600  .  dorzolamide-timolol  1 drop Left Eye BID  . feeding supplement (PRO-STAT SUGAR FREE 64)  30 mL Oral BID  . ferric citrate  420 mg Oral TID WC  . latanoprost  1 drop Left Eye QHS  . loratadine  10 mg Oral Daily  . midodrine  10 mg Oral 2 times per day on Tue Thu Sat  . midodrine  5 mg Oral Once per day on Sun Mon Wed Fri  . multivitamin  1 tablet Oral Daily  . Vitamin D (Ergocalciferol)  50,000 Units Oral Q Fri  . warfarin  5 mg Oral ONCE-1800  . Warfarin - Pharmacist Dosing Inpatient   Does not apply q1800     Dialysis Orders: [facility] TTS4h 2/2 bath 80.5kg400/800 P2 RUA AVG -Hep 5800units IV TIW -Calcitriol 0.12mcg PO TIW  Assessment/Plan: 1.Acute colitis. CT abd -thick walled cecum.Colonoscopyrevealed severe ischemic insult to entire cecum and ileocecal valve. Long term prognosis less favorable as this is associated with perforation. Bx pending Per primary/GI. CT Abd/Pelvis 03/15/2019-Wall thickening and inflammatory changes at the cecum have worsened. There is no evidence of perforation. Off ABX.  2. ESRD -HD TTS.Next HD 03/18/19 K+ 4.1. Hold heparin.  3. HTN/volume-HD 04/11Pre wt  79.4 kg Net UF 736  Post wt 78 kg.BP still on soft side.Midodrine 10 mg pre hd andmid run HD.Increase midodrine dose MWFS to 10 mg PO daily. Will need lower EDW on DC.Avoid BP drop given prior ischemic insult to colon - keep systolic BP > 341 4. Anemia-Hgb 10.9 No ESA needs yet. Mircera 75 last dosed 3/26 as outpatient. 5.Metabolic Bone Disease- Continue binders, decreased VDRA to 0.25 mcg PO TIW- corrected Ca had been elevated but he is also on 50K vit D weekly which can be contributory P up - / missing - intake variable - not a big issue for the short term 6. P Afib. On amiodarone, warfarin per pharmacy INR at goal - avoid low K on dialysis 7. NICM EF 25% 8. HIV. Continue usual meds 9. 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  Rita H. Brown NP-C 03/17/2019, 10:56 AM  [facility] [phone]  Pt seen, examined and agree w A/P as above.  [facility] 03/17/2019, 3:31 PM

## 2019-03-18 ENCOUNTER — Telehealth: Payer: Self-pay | Admitting: Family

## 2019-03-18 LAB — RENAL FUNCTION PANEL
Albumin: 2.6 g/dL — ABNORMAL LOW (ref 3.5–5.0)
Anion gap: 17 — ABNORMAL HIGH (ref 5–15)
BUN: 58 mg/dL — ABNORMAL HIGH (ref 8–23)
CO2: 23 mmol/L (ref 22–32)
Calcium: 9.1 mg/dL (ref 8.9–10.3)
Chloride: 96 mmol/L — ABNORMAL LOW (ref 98–111)
Creatinine, Ser: 11.88 mg/dL — ABNORMAL HIGH (ref 0.61–1.24)
GFR calc Af Amer: 4 mL/min — ABNORMAL LOW (ref >60)
GFR calc non Af Amer: 4 mL/min — ABNORMAL LOW (ref >60)
Glucose, Bld: 95 mg/dL (ref 70–99)
Phosphorus: 7.2 mg/dL — ABNORMAL HIGH (ref 2.5–4.6)
Potassium: 3.4 mmol/L — ABNORMAL LOW (ref 3.5–5.1)
Sodium: 136 mmol/L (ref 135–145)

## 2019-03-18 LAB — CBC
HCT: 31 % — ABNORMAL LOW (ref 39.0–52.0)
Hemoglobin: 9.8 g/dL — ABNORMAL LOW (ref 13.0–17.0)
MCH: 33.8 pg (ref 26.0–34.0)
MCHC: 31.6 g/dL (ref 30.0–36.0)
MCV: 106.9 fL — ABNORMAL HIGH (ref 80.0–100.0)
Platelets: 378 10*3/uL (ref 150–400)
RBC: 2.9 MIL/uL — ABNORMAL LOW (ref 4.22–5.81)
RDW: 13.7 % (ref 11.5–15.5)
WBC: 9.1 10*3/uL (ref 4.0–10.5)
nRBC: 0 % (ref 0.0–0.2)

## 2019-03-18 LAB — PROTIME-INR
INR: 1.6 — ABNORMAL HIGH (ref 0.8–1.2)
Prothrombin Time: 18.5 seconds — ABNORMAL HIGH (ref 11.4–15.2)

## 2019-03-18 MED ORDER — DARBEPOETIN ALFA 40 MCG/0.4ML IJ SOSY
PREFILLED_SYRINGE | INTRAMUSCULAR | Status: AC
  Administered 2019-03-18: 40 ug via ARTERIOVENOUS_FISTULA
  Filled 2019-03-18: qty 0.4

## 2019-03-18 MED ORDER — MIDODRINE HCL 5 MG PO TABS
ORAL_TABLET | ORAL | Status: AC
  Filled 2019-03-18: qty 10

## 2019-03-18 MED ORDER — SODIUM CHLORIDE 0.9 % IV SOLN: 100.0000 mL | INTRAVENOUS | Status: DC | PRN

## 2019-03-18 MED ORDER — WARFARIN SODIUM 5 MG PO TABS
5.0000 mg | ORAL_TABLET | Freq: Once | ORAL | Status: DC
  Filled 2019-03-18: qty 1

## 2019-03-18 NOTE — Discharge Summary (Signed)
Physician Discharge Summary  [facility] XBD:[id number] DOB: March 18, 1941 DOA: 03/08/2019  PCP: [name], NP  Admit date: 03/08/2019 Discharge date: 03/18/2019  Admitted From: Home Disposition:  Home  Recommendations for Outpatient Follow-up:  1. Follow up with PCP in 1-2 weeks 2. Palliative care to follow-up with patient at home.  Home Health:No Equipment/Devices:None  Discharge Condition:Stable CODE STATUS:Full Diet recommendation: Renal diet  Brief/Interim Summary: 78 y.o. 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 Thursdays and Saturdays, history of combined systolic and diastolic heart failure, HIV prostate cancer who came in with a right lower quadrant abdominal pain seated with nausea and vomiting, CT scan of the abdomen and pelvis shows cecal inflammation with colitis, admitted to the hospital started on broad-spectrum antibiotics  Discharge Diagnoses:  Principal Problem:   Colitis presumed infectious Active Problems:   ADENOCARCINOMA, PROSTATE, GLEASON GRADE 3   Hyperlipidemia   Nonischemic dilated cardiomyopathy (HCC)   ESRD on hemodialysis (HCC)   HIV (human immunodeficiency virus infection) ([place])   Atrial fibrillation, chronic   Chronic anticoagulation   CAD (coronary artery disease)   Gastroesophageal reflux disease   Ischemic colitis ([place])   Mesenteric ischemia ([place])   Colonic ulcer   Right lower quadrant pain   Abnormal CT of the abdomen  Acute ischemic colitis: On admission he was started on a 7-day course of IV antibiotics which he completed his course in-house. GI was consulted recommended colonoscopy performed on 03/14/2019 that showed severe ischemic colitis. CT angios of the abdomen and pelvis showed no significant stenosis, it did show wall thickening and inflammatory changes of the cecum but no evidence of perforation. Vascular surgery was consulted as per GIs request and they recommended conservative  management. We have called in a bad situation as he does have an EF of 20% with end-stage renal disease which makes managing his fluids hard specially if he does not comply with his fluid intake at home. He is currently on Middaugh drain and is likely that his episode of acute ischemic colitis is due to HD. After having multiple discussions in multiple days with the patient he finally agreed on the day of discharge to meet with hospice and palliative care. I will arrange palliative care to follow-up with him at home.  Chronic atrial fibrillation: No changes made to his medication heart rate is controlled continue Coumadin.  End stage renal disease on dialysis Tuesday Thursdays and Saturdays: He remained euvolemic continue dialysis per renal as an outpatient.  HIV: He remained asymptomatic continue current regimen.  History of adenocarcinoma of the prostate Gleason 3 grade: Continue to follow-up with oncology as an outpatient.  Nonischemic cardiomyopathy systolic and diastolic heart failure: A 2D echo showed an EF of 20% on 02/03/2019. He remained euvolemic asymptomatic and orthostatic.   continue current regimen   Discharge Instructions  Discharge Instructions    Diet - low sodium heart healthy   Complete by:  As directed    Increase activity slowly   Complete by:  As directed      Allergies as of 03/18/2019      Reactions   Cozaar [losartan Potassium] Other (See Comments)   Causes constipation      Medication List    STOP taking these medications   Vitamin D (Ergocalciferol) 1.25 MG (50000 UT) Caps capsule Commonly known as:  DRISDOL     TAKE these medications   acetaminophen 500 MG tablet Commonly known as:  TYLENOL Take 500-1,000 mg by mouth every  6 (six) hours as needed for mild pain.   allopurinol 100 MG tablet Commonly known as:  ZYLOPRIM TAKE 1 TABLET EVERY DAY   amiodarone 200 MG tablet Commonly known as:  PACERONE TAKE 1 TABLET(200 MG) BY MOUTH  DAILY What changed:  See the new instructions.   Auryxia 1 GM 210 MG(Fe) tablet Generic drug:  ferric citrate Take 420 mg by mouth 3 (three) times daily with meals.   bictegravir-emtricitabine-tenofovir AF 50-200-25 MG Tabs tablet Commonly known as:  [name] Take 1 tablet by mouth daily.   brimonidine 0.15 % ophthalmic solution Commonly known as:  ALPHAGAN Place 1 drop into the left eye 2 (two) times daily.   dorzolamide-timolol 22.3-6.8 MG/ML ophthalmic solution Commonly known as:  COSOPT Place 1 drop into the left eye 2 (two) times daily.   latanoprost 0.005 % ophthalmic solution Commonly known as:  XALATAN Place 1 drop into the left eye at bedtime.   loratadine 10 MG tablet Commonly known as:  CLARITIN Take 1 tablet (10 mg total) by mouth daily.   midodrine 10 MG tablet Commonly known as:  PROAMATINE Take 1 tablet (10 mg total) by mouth as directed. Twice a day on the day of HD Tuesday, Thursday, Saturday (1 in AM and 1 at [place]). Take 1/2 tablet (5mg ) a day on non HD days   multivitamin Tabs tablet Take 1 tablet by mouth daily.   warfarin 5 MG tablet Commonly known as:  COUMADIN Take as directed. If you are unsure how to take this medication, talk to your nurse or doctor. Original instructions:  Take 2 tablets by mouth daily or as directed by coumadin clinic What changed:    how much to take  how to take this  when to take this  additional instructions       Allergies  Allergen Reactions  . Cozaar [Losartan Potassium] Other (See Comments)    Causes constipation     Consultations:  Gi  Renal   Procedures/Studies: Ct Abdomen Pelvis Wo Contrast  Result Date: 03/09/2019 CLINICAL DATA:  Right lower quadrant pain. EXAM: CT ABDOMEN AND PELVIS WITHOUT CONTRAST TECHNIQUE: Multidetector CT imaging of the abdomen and pelvis was performed following the standard protocol without IV contrast. COMPARISON:  None. FINDINGS: Lower chest: Cardiomegaly. The lower  chest and lungs are otherwise normal. Hepatobiliary: No focal liver abnormality is seen. No gallstones, gallbladder wall thickening, or biliary dilatation. Pancreas: Unremarkable. No pancreatic ductal dilatation or surrounding inflammatory changes. Spleen: Normal in size without focal abnormality. Adrenals/Urinary Tract: The adrenal glands are normal. The kidneys are atrophic without hydronephrosis or perinephric stranding. No stones are identified. Mass is associated with both kidneys are favored to represent hyperdense cysts. A few lower density cysts are noted. The ureters are normal. The bladder is decompressed but unremarkable. Stomach/Bowel: The stomach and small bowel are normal. Majority of the colon is normal. The cecum appears thick walled as seen on coronal images 51 and 56 as well as axial image 51. There is mild increased attenuation in the fat adjacent to the cecum which is relatively subtle. The appendix is well seen and normal. No evidence of appendicitis. Vascular/Lymphatic: Atherosclerotic changes are seen in the nonaneurysmal aorta. No adenopathy noted. Reproductive: Brachytherapy seeds are seen in the prostate. Other: No other acute abnormalities identified. Musculoskeletal: Severe degenerative changes in the left hip. Degenerative changes in the visualized lumbar spine. IMPRESSION: 1. Evaluation of the colon is limited due to the lack of oral contrast. However, I believe the cecum  is thick walled which could be inflammatory or infectious. A neoplastic process is not excluded on today's imaging. Recommend clinical correlation. If the patient has not had a recent colonoscopy, recommend consultation with a gastroenterologist with consideration of a colonoscopy. 2. The appendix is normal in appearance with no appendicitis. 3. Atrophic kidneys with hyperdense cysts. 4. Atherosclerotic changes in the nonaneurysmal aorta. 5. Degenerative changes in the lumbar spine and left hip. Electronically Signed    By: Dorise Bullion III M.D   On: 03/09/2019 00:39   US Pelvis Limited (transabdominal Only)  Result Date: 03/17/2019 CLINICAL DATA:  78 year old male with abnormal density in the RIGHT inguinal canal on recent CT. EXAM: SCROTAL ULTRASOUND LIMITED PELVIC ULTRASOUND TECHNIQUE: Complete ultrasound examination of the testicles, epididymis, and other scrotal structures was performed. Color and spectral Doppler ultrasound were also utilized to evaluate blood flow to the testicles. COMPARISON:  03/15/2019 and 03/09/2019 CTs FINDINGS: Right testicle Measurements: 3.4 x 1.9 x 2.4 cm. No mass identified. A peripheral calcification without adjacent mass noted. Left testicle Measurements: 3.7 x 1.7 x 2.4 cm. No mass or microlithiasis visualized. Right epididymis:  Normal in size and appearance. Left epididymis:  Unremarkable except for small epididymal cysts. Hydrocele:  Bilateral hydroceles noted. Varicocele:  None visualized. Other: A 6.2 x 2.3 x 4.5 cm mildly complex cystic structure in the RIGHT inguinal canal noted corresponding to the CT finding. IMPRESSION: 1. 6.2 x 2.3 x 4.5 cm mildly complex cystic structure in the RIGHT inguinal canal which most likely represents a mildly complicated hydrocele. This corresponds to the recent CT finding. 2. No significant testicular findings. 3. Bilateral hydroceles. Electronically Signed   By: Margarette Canada M.D.   On: 03/17/2019 14:16   US Scrotum  Result Date: 03/17/2019 CLINICAL DATA:  78 year old male with abnormal density in the RIGHT inguinal canal on recent CT. EXAM: SCROTAL ULTRASOUND LIMITED PELVIC ULTRASOUND TECHNIQUE: Complete ultrasound examination of the testicles, epididymis, and other scrotal structures was performed. Color and spectral Doppler ultrasound were also utilized to evaluate blood flow to the testicles. COMPARISON:  03/15/2019 and 03/09/2019 CTs FINDINGS: Right testicle Measurements: 3.4 x 1.9 x 2.4 cm. No mass identified. A peripheral calcification  without adjacent mass noted. Left testicle Measurements: 3.7 x 1.7 x 2.4 cm. No mass or microlithiasis visualized. Right epididymis:  Normal in size and appearance. Left epididymis:  Unremarkable except for small epididymal cysts. Hydrocele:  Bilateral hydroceles noted. Varicocele:  None visualized. Other: A 6.2 x 2.3 x 4.5 cm mildly complex cystic structure in the RIGHT inguinal canal noted corresponding to the CT finding. IMPRESSION: 1. 6.2 x 2.3 x 4.5 cm mildly complex cystic structure in the RIGHT inguinal canal which most likely represents a mildly complicated hydrocele. This corresponds to the recent CT finding. 2. No significant testicular findings. 3. Bilateral hydroceles. Electronically Signed   By: Margarette Canada M.D.   On: 03/17/2019 14:16   Dg Chest Port 1 View  Result Date: 03/05/2019 CLINICAL DATA:  Follow-up endotracheal tube EXAM: PORTABLE CHEST 1 VIEW COMPARISON:  03/04/2019 FINDINGS: Cardiac shadow remains enlarged. Endotracheal tube is noted in satisfactory position. Gastric catheter is noted extending into the stomach. The previously seen vascular congestion and edema has resolved in the interval. No focal infiltrate or sizable effusion is noted. No bony abnormality is seen. IMPRESSION: Interval resolution of CHF. Electronically Signed   By: Inez Catalina M.D.   On: 03/05/2019 07:54   Dg Chest Portable 1 View  Result Date: 03/04/2019 CLINICAL DATA:  Respiratory distress EXAM: PORTABLE CHEST 1 VIEW COMPARISON:  02/10/2019 FINDINGS: Endotracheal tube tip just below the clavicular heads. The orogastric tube reaches the stomach at least. Cardiomegaly with diffuse interstitial and airspace opacity. The airspace opacity is symmetric and there are [employer]. IMPRESSION: 1. Hardware in unremarkable position. 2. CHF pattern. Electronically Signed   By: Monte Fantasia M.D.   On: 03/04/2019 06:23   Ct Angio Abd/pel W/ And/or W/o  Result Date: 03/16/2019 CLINICAL DATA:  Suspected ischemic colitis  EXAM: CTA ABDOMEN AND PELVIS wITHOUT AND WITH CONTRAST TECHNIQUE: Multidetector CT imaging of the abdomen and pelvis was performed using the standard protocol during bolus administration of intravenous contrast. Multiplanar reconstructed images and MIPs were obtained and reviewed to evaluate the vascular anatomy. CONTRAST:  175mL OMNIPAQUE IOHEXOL 350 MG/ML SOLN COMPARISON:  None. FINDINGS: VASCULAR Aorta: Nonaneurysmal and patent with mild scattered atherosclerotic calcifications. 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 Branch vessels patent. SMA: Patent. There is hypertrophy of pancreatic branches which provide collateral flow to the celiac axis territory. Right colic and ileocolic branches are grossly patent. Renals: Single renal arteries are patent. IMA: Patent and diminutive. Inflow: Bilateral common, internal, and external iliac arteries are patent with scattered atherosclerotic plaque. Proximal Outflow: Grossly patent. Veins: No evidence of DVT. Review of the MIP images confirms the above findings. NON-VASCULAR Lower chest: Dependent atelectasis. Cardiomegaly with severe dilatation of the left ventricle. Minimal coronary artery calcification. Hepatobiliary: Unremarkable appearance of the liver. Sludge in the gallbladder. Pancreas: Unremarkable Spleen: Unremarkable Adrenals/Urinary Tract: Several lesions scattered throughout both kidneys are noted. Some of these are hyperdense and a small renal cell carcinoma cannot be excluded. There is a 1.1 cm nonspecific nodule in the left adrenal gland. Bladder is decompressed. Stomach/Bowel: Wall thickening and adjacent inflammatory changes involving the cecum have worsened. There is no extraluminal bowel gas to suggest perforation. There is no evidence of abscess. The appendix is within normal limits and best visualized on sagittal reconstruction imaging. Stomach and small bowel are decompressed. There is no evidence of wall thickening  throughout the remainder of the colon. Lymphatic: No abnormal retroperitoneal adenopathy. Reproductive: Brachy therapy pellets in the prostate gland. Soft tissue density in the distal right inguinal region is noted. Undescended testicle is not excluded. Other: No free fluid. Musculoskeletal: No free-fluid. IMPRESSION: VASCULAR Although there is narrowing in the celiac axis due to median arcuate ligament syndrome, the day SMA and IMA are patent. Furthermore, the right and ileocolic branches of the SMA are also patent. NON-VASCULAR Wall thickening and inflammatory changes at the cecum have worsened. There is no evidence of perforation. Cardiomegaly with severe dilatation of the left ventricle. There are several lesions throughout the kidneys. Small renal cell carcinoma cannot be excluded. Left adrenal nodule is nonspecific. If there is a history of malignancy or risk of malignancy, six-month follow-up MRI is recommended. Possible undescended right testicle.  Ultrasound is recommended. Electronically Signed   By: Marybelle Killings M.D.   On: 03/16/2019 09:58     Subjective: No complains  Discharge Exam: Vitals:   03/18/19 0830 03/18/19 0900  BP: 104/70 122/67  Pulse: (!) 59 70  Resp:    Temp:    SpO2:       General: Pt is alert, awake, not in acute distress Cardiovascular: RRR, S1/S2 +, no rubs, no gallops Respiratory: CTA bilaterally, no wheezing, no rhonchi Abdominal: Soft, NT, ND, bowel sounds + Extremities: no edema, no cyanosis    The results of significant diagnostics  from this hospitalization (including imaging, microbiology, ancillary and laboratory) are listed below for reference.     Microbiology: No results found for this or any previous visit (from the past 240 hour(s)).   Labs: BNP (last 3 results) Recent Labs    12/04/18 2207 02/11/19 0015 03/04/19 0519  BNP 1,630.5* 1,054.2* 9,326.7*   Basic Metabolic Panel: Recent Labs  Lab 03/12/19 0358 03/13/19 1540  03/14/19 0705 03/15/19 2113 03/18/19 0817  NA 137 137 140 135 136  K 4.2 3.7 4.0 4.1 3.4*  CL 94* 95* 99 96* 96*  CO2 26 26 23 22 23   GLUCOSE 115* 105* 86 148* 95  BUN 43* 27* 39* 59* 58*  CREATININE 8.34* 6.54* 8.88* 11.95* 11.88*  CALCIUM 9.2 8.8* 9.0 8.8* 9.1  MG  --   --  2.5*  --   --   PHOS  --  5.9*  --  8.7* 7.2*   Liver Function Tests: Recent Labs  Lab 03/13/19 1540 03/15/19 2113 03/18/19 0817  ALBUMIN 2.9* 2.5* 2.6*   No results for input(s): LIPASE, AMYLASE in the last 168 hours. No results for input(s): AMMONIA in the last 168 hours. CBC: Recent Labs  Lab 03/12/19 0358 03/13/19 1540 03/14/19 0705 03/15/19 2121 03/18/19 0817  WBC 11.3* 9.1 8.5 8.3 9.1  HGB 11.4* 11.7* 11.2* 10.9* 9.8*  HCT 35.8*  34.3* 36.0* 33.3* 33.5* 31.0*  MCV 106.5* 106.5* 105.7* 105.3* 106.9*  PLT 276 302 279 358 378   Cardiac Enzymes: No results for input(s): CKTOTAL, CKMB, CKMBINDEX, TROPONINI in the last 168 hours. BNP: Invalid input(s): POCBNP CBG: No results for input(s): GLUCAP in the last 168 hours. D-Dimer No results for input(s): DDIMER in the last 72 hours. Hgb A1c No results for input(s): HGBA1C in the last 72 hours. Lipid Profile No results for input(s): CHOL, HDL, LDLCALC, TRIG, CHOLHDL, LDLDIRECT in the last 72 hours. Thyroid function studies No results for input(s): TSH, T4TOTAL, T3FREE, THYROIDAB in the last 72 hours.  Invalid input(s): FREET3 Anemia work up No results for input(s): VITAMINB12, FOLATE, FERRITIN, TIBC, IRON, RETICCTPCT in the last 72 hours. Urinalysis    Component Value Date/Time   COLORURINE YELLOW 02/17/2018 0540   APPEARANCEUR CLOUDY (A) 02/17/2018 0540   LABSPEC 1.010 02/17/2018 0540   PHURINE 5.0 02/17/2018 0540   GLUCOSEU NEGATIVE 02/17/2018 0540   HGBUR LARGE (A) 02/17/2018 0540   BILIRUBINUR NEGATIVE 02/17/2018 0540   BILIRUBINUR negative 04/16/2013 1447   KETONESUR NEGATIVE 02/17/2018 0540   PROTEINUR 100 (A) 02/17/2018  0540   UROBILINOGEN 0.2 05/11/2014 1352   NITRITE NEGATIVE 02/17/2018 0540   LEUKOCYTESUR NEGATIVE 02/17/2018 0540   Sepsis Labs Invalid input(s): PROCALCITONIN,  WBC,  LACTICIDVEN Microbiology No results found for this or any previous visit (from the past 240 hour(s)).   Time coordinating discharge: 40 minutes  SIGNED:   Charlynne Cousins, MD  [facility]

## 2019-03-18 NOTE — Progress Notes (Signed)
ANTICOAGULATION CONSULT NOTE - Follow Up Consult  Pharmacy Consult for Coumadin Indication:  Afib  Allergies  Allergen Reactions  . Cozaar [Losartan Potassium] Other (See Comments)    Causes constipation    Vital Signs: Temp: 98 F (36.7 C) (04/14 0748) Temp Source: Oral (04/14 0748) BP: 92/62 (04/14 0801) Pulse Rate: 56 (04/14 0801)  Labs: Recent Labs    03/15/19 2113 03/15/19 2121 03/16/19 0542 03/17/19 0509 03/18/19 0320  HGB  --  10.9*  --   --   --   HCT  --  33.5*  --   --   --   PLT  --  358  --   --   --   LABPROT  --   --  23.7* 19.9* 18.5*  INR  --   --  2.2* 1.7* 1.6*  CREATININE 11.95*  --   --   --   --     Estimated Creatinine Clearance: 5.9 mL/min (A) (by C-G formula based on SCr of 11.95 mg/dL (H)).  Assessment: 78 yo M with Afib on Coumadin 5mg  daily PTA-held for colonoscopy on 4/10. Coumadin was held and now restarting on 4/13. INR down to 1.6. Hgb 10.9, pltc wnl  Goal of Therapy:  INR 2.0-3.0  Plan:  Give Coumadin 5mg  PO tonight Monitor daily INR, CBC, s/s of bleed  Ivalene Platte J 03/18/2019,8:28 AM

## 2019-03-18 NOTE — Progress Notes (Addendum)
[facility] Progress Note   Subjective: Discharge orders noted. Seen on HD. Will have lower EDW on DC.    Objective Vitals:   03/18/19 0900 03/18/19 0930 03/18/19 1000 03/18/19 1030  BP: 122/67 121/60 (!) 114/58 (!) 150/60  Pulse: 70 72 72 (!) 59  Resp:      Temp:      TempSrc:      SpO2:      Weight:      Height:       Physical Exam General:WN, WD older male in NAD Heart:S1,S2, RRR Lungs:CTAB A/P Abdomen:Tenderness lower quadrants of abd Extremities:No LE edema. Dry scaly skin BLE Dialysis Access:R AVG cannulated without issues.     Additional Objective Labs: Basic Metabolic Panel: Recent Labs  Lab 03/13/19 1540 03/14/19 0705 03/15/19 2113 03/18/19 0817  NA 137 140 135 136  K 3.7 4.0 4.1 3.4*  CL 95* 99 96* 96*  CO2 26 23 22 23   GLUCOSE 105* 86 148* 95  BUN 27* 39* 59* 58*  CREATININE 6.54* 8.88* 11.95* 11.88*  CALCIUM 8.8* 9.0 8.8* 9.1  PHOS 5.9*  --  8.7* 7.2*   Liver Function Tests: Recent Labs  Lab 03/13/19 1540 03/15/19 2113 03/18/19 0817  ALBUMIN 2.9* 2.5* 2.6*   No results for input(s): LIPASE, AMYLASE in the last 168 hours. CBC: Recent Labs  Lab 03/12/19 0358 03/13/19 1540 03/14/19 0705 03/15/19 2121 03/18/19 0817  WBC 11.3* 9.1 8.5 8.3 9.1  HGB 11.4* 11.7* 11.2* 10.9* 9.8*  HCT 35.8*  34.3* 36.0* 33.3* 33.5* 31.0*  MCV 106.5* 106.5* 105.7* 105.3* 106.9*  PLT 276 302 279 358 378   Blood Culture    Component Value Date/Time   SDES BLOOD LEFT FOREARM 02/11/2019 0030   SPECREQUEST  02/11/2019 0030    BOTTLES DRAWN AEROBIC AND ANAEROBIC Blood Culture results may not be optimal due to an inadequate volume of blood received in culture bottles   CULT  02/11/2019 0030    NO GROWTH 5 DAYS Performed at [facility], [place] [address] [place], [place] [address]    REPTSTATUS 02/16/2019 FINAL 02/11/2019 0030    Cardiac Enzymes: No results for input(s): CKTOTAL, CKMB, CKMBINDEX, TROPONINI in the last 168  hours. CBG: No results for input(s): GLUCAP in the last 168 hours. Iron Studies: No results for input(s): IRON, TIBC, TRANSFERRIN, FERRITIN in the last 72 hours. @lablastinr3 @ Studies/Results: US Pelvis Limited (transabdominal Only)  Result Date: 03/17/2019 CLINICAL DATA:  78 year old male with abnormal density in the RIGHT inguinal canal on recent CT. EXAM: SCROTAL ULTRASOUND LIMITED PELVIC ULTRASOUND TECHNIQUE: Complete ultrasound examination of the testicles, epididymis, and other scrotal structures was performed. Color and spectral Doppler ultrasound were also utilized to evaluate blood flow to the testicles. COMPARISON:  03/15/2019 and 03/09/2019 CTs FINDINGS: Right testicle Measurements: 3.4 x 1.9 x 2.4 cm. No mass identified. A peripheral calcification without adjacent mass noted. Left testicle Measurements: 3.7 x 1.7 x 2.4 cm. No mass or microlithiasis visualized. Right epididymis:  Normal in size and appearance. Left epididymis:  Unremarkable except for small epididymal cysts. Hydrocele:  Bilateral hydroceles noted. Varicocele:  None visualized. Other: A 6.2 x 2.3 x 4.5 cm mildly complex cystic structure in the RIGHT inguinal canal noted corresponding to the CT finding. IMPRESSION: 1. 6.2 x 2.3 x 4.5 cm mildly complex cystic structure in the RIGHT inguinal canal which most likely represents a mildly complicated hydrocele. This corresponds to the recent CT finding. 2. No significant testicular findings. 3. Bilateral  hydroceles. Electronically Signed   By: Margarette Canada M.D.   On: 03/17/2019 14:16   US Scrotum  Result Date: 03/17/2019 CLINICAL DATA:  78 year old male with abnormal density in the RIGHT inguinal canal on recent CT. EXAM: SCROTAL ULTRASOUND LIMITED PELVIC ULTRASOUND TECHNIQUE: Complete ultrasound examination of the testicles, epididymis, and other scrotal structures was performed. Color and spectral Doppler ultrasound were also utilized to evaluate blood flow to the testicles.  COMPARISON:  03/15/2019 and 03/09/2019 CTs FINDINGS: Right testicle Measurements: 3.4 x 1.9 x 2.4 cm. No mass identified. A peripheral calcification without adjacent mass noted. Left testicle Measurements: 3.7 x 1.7 x 2.4 cm. No mass or microlithiasis visualized. Right epididymis:  Normal in size and appearance. Left epididymis:  Unremarkable except for small epididymal cysts. Hydrocele:  Bilateral hydroceles noted. Varicocele:  None visualized. Other: A 6.2 x 2.3 x 4.5 cm mildly complex cystic structure in the RIGHT inguinal canal noted corresponding to the CT finding. IMPRESSION: 1. 6.2 x 2.3 x 4.5 cm mildly complex cystic structure in the RIGHT inguinal canal which most likely represents a mildly complicated hydrocele. This corresponds to the recent CT finding. 2. No significant testicular findings. 3. Bilateral hydroceles. Electronically Signed   By: Margarette Canada M.D.   On: 03/17/2019 14:16   Medications: . sodium chloride    . sodium chloride    . sodium chloride 250 mL (03/15/19 0451)  . sodium chloride    . sodium chloride     . allopurinol  100 mg Oral Daily  . amiodarone  200 mg Oral Daily  . bictegravir-emtricitabine-tenofovir AF  1 tablet Oral Daily  . brimonidine  1 drop Left Eye BID  . Chlorhexidine Gluconate Cloth  6 each Topical Q0600  . dorzolamide-timolol  1 drop Left Eye BID  . feeding supplement (PRO-STAT SUGAR FREE 64)  30 mL Oral BID  . ferric citrate  420 mg Oral TID WC  . latanoprost  1 drop Left Eye QHS  . loratadine  10 mg Oral Daily  . midodrine      . midodrine  10 mg Oral 2 times per day on Tue Thu Sat  . midodrine  10 mg Oral Once per day on Sun Mon Wed Fri  . multivitamin  1 tablet Oral Daily  . warfarin  5 mg Oral ONCE-1800  . Warfarin - Pharmacist Dosing Inpatient   Does not apply q1800   Dialysis Orders: [facility] TTS4h 2/2 bath 80.5kg400/800 P2 RUA AVG -Hep 5800units IV TIW -Calcitriol 0.32mcg PO TIW  Assessment/Plan: 1.Acute colitis. CT abd  -thick walled cecum.Colonoscopyrevealed severe ischemic insult to entire cecum and ileocecal valve. Long term prognosis less favorable as this is associated with perforation. Bx pending Per primary/GI. CT Abd/Pelvis 03/15/2019-Wall thickening and inflammatory changes at the cecum have worsened. There is no evidence of perforation. Off ABX.  2. ESRD -HD TTS.HD today on schedule. K+ 3.4 Use 4.0 K bath. Change to 3.0 K bath as OP . Hold heparin. DC heparin on discharge orders.  3. HTN/volume-HD 04/11Pre wt 79.4 kg Net UF 736  Post wt 78 kg.BP still on soft side.Midodrine 10 mg pre hd andmid run HD.Increase midodrine dose MWFS to 10 mg PO daily. Will need lower EDW on DC.Avoid BP dropgiven prior ischemic insult to colon - keep systolic BP > 536 4. Anemia-Hgb 9.8Give Aranesp 40 mcg IV with HD today.  5.Metabolic Bone Disease- Continue binders, decreased VDRA to 0.25 mcg PO TIW- corrected Ca had been elevated but he is also  on 50K vit D weekly which can be contributory P up - / missing - intake variable - not a big issue for the short term 6. P Afib. On amiodarone, warfarin per pharmacy INR at goal- avoid low K on dialysis 7. NICM EF 25% 8. HIV. Continue usual meds 9. 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  Rita H. Brown NP-C 03/18/2019, 10:52 AM  [facility] [phone]  Pt seen, examined and agree w A/P as above.  [facility] 03/18/2019, 4:18 PM

## 2019-03-18 NOTE — TOC Progression Note (Signed)
Transition of Care [facility]) - Progression Note    Patient Details  Name: Ritik Stavola MRN: [id number] Date of Birth: 1941-03-26  Transition of Care [facility]) CM/SW Contact  Bartholomew Crews, RN Phone Number: [phone] 03/18/2019, 10:08 AM  Clinical Narrative:    Referral made to [facility] for palliative care.       Expected Discharge Plan and Services           Expected Discharge Date: 03/18/19                         Social Determinants of Health (SDOH) Interventions    Readmission Risk Interventions No flowsheet data found.

## 2019-03-18 NOTE — Telephone Encounter (Signed)
Please call pt to arrange virtual TCM follow up with me please.

## 2019-03-18 NOTE — Progress Notes (Signed)
PT Cancellation Note  Patient Details Name: David [name] MRN: [id number] DOB: 03-15-1941   Cancelled Treatment:    Reason Eval/Treat Not Completed: Patient at procedure or test/unavailable   Currently in HD;   Will follow up later today as time allows;    Thank you,  Roney Marion, PT  Acute Rehabilitation Services Pager [phone] Office [phone]     Colletta [place] 03/18/2019, 8:18 AM

## 2019-03-18 NOTE — TOC Transition Note (Signed)
Transition of Care [facility]) - CM/SW Discharge Note   Patient Details  Name: David [name] MRN: [id number] Date of Birth: 06-Jan-1941  Transition of Care [facility]) CM/SW Contact:  Bartholomew Crews, RN Phone Number: 03/18/2019, 1:02 PM   Clinical Narrative:    Spoke with patient at bedside upon return from hemodialysis. Discussed recommendations for [facility] PT and provided choice list. Patient declines [place] services at this time stating that he has his wife to assist. Discussed that he may reach out through his PCP after returning home if he decides he would like to have [facility] services. Patient verbalized understanding. Patient stated that his wife will provide transportation and that she ensures that he gets to/from hemodialysis. Advised patient of f/u call from [facility] for palliative care. Patient verbalized understanding. No other transition of care needs identified at this time.   Final next level of care: Home/Self Care Barriers to Discharge: No Barriers Identified   Patient Goals and CMS Choice   CMS Medicare.gov Compare Post Acute Care list provided to:: Patient Choice offered to / list presented to : Patient  Discharge Placement                       Discharge Plan and Services In-house Referral: Hospice / Palliative Care([facility] (palliative)) Discharge Planning Services: CM Consult Post Acute Care Choice: NA          DME Arranged: N/A DME Agency: NA HH Arranged: PT HH Agency: (declined PT services)   Social Determinants of Health (SDOH) Interventions     Readmission Risk Interventions No flowsheet data found.

## 2019-03-18 NOTE — Progress Notes (Signed)
[name] to be discharged Home per MD order. Discussed prescriptions and follow up appointments with the patient. Prescriptions given to patient; medication list explained in detail. Patient verbalized understanding.  Skin clean, dry and intact without evidence of skin break down, no evidence of skin tears noted. IV catheter discontinued intact. Site without signs and symptoms of complications. Dressing and pressure applied. Pt denies pain at the site currently. No complaints noted.  Patient free of lines, drains, and wounds.   An After Visit Summary (AVS) was printed and given to the patient. Patient escorted via wheelchair, and discharged home via private auto.  Dorthey Sawyer, RN

## 2019-03-18 NOTE — Progress Notes (Signed)
Palliative Medicine RN Note: Consult order noted for [place] discussions at home. Patient is in HD with plan to d/c when done; d/c orders and notes already in.   I paged Dr Olevia Bowens to let him know that PMT only sees patients who are actively admitted to the hospital and does not have an outpatient arm. Outpatient, home-based palliative care is provided by either [facility] or [facility], and that would need to be set up by [facility]. As PMT is not associated with either company, we are unable to assist with setting this up.  PMT will not see this patient, as we do provide the requested service. If his d/c is cancelled/delayed, and PMT visit is requested while he is here, please call our office.   Marjie Skiff Shreya Lacasse, RN, BSN, [facility] Palliative Medicine Team 03/18/2019 9:49 AM Office [phone]

## 2019-03-19 ENCOUNTER — Other Ambulatory Visit: Payer: Self-pay

## 2019-03-19 ENCOUNTER — Other Ambulatory Visit: Payer: Medicare HMO

## 2019-03-19 DIAGNOSIS — B20 Human immunodeficiency virus [HIV] disease: Secondary | ICD-10-CM

## 2019-03-19 NOTE — Telephone Encounter (Signed)
Per provider request called patient and his wife for TCM hospital follow up appointment. Left message on answering machine for return call on both phones. I have called patient several times for hospital follow up however I have not received a return phone call.

## 2019-03-20 ENCOUNTER — Telehealth: Payer: Self-pay

## 2019-03-20 DIAGNOSIS — N186 End stage renal disease: Secondary | ICD-10-CM | POA: Diagnosis not present

## 2019-03-20 DIAGNOSIS — N2581 Secondary hyperparathyroidism of renal origin: Secondary | ICD-10-CM | POA: Diagnosis not present

## 2019-03-20 DIAGNOSIS — J810 Acute pulmonary edema: Secondary | ICD-10-CM | POA: Diagnosis not present

## 2019-03-20 LAB — T-HELPER CELL (CD4) - (RCID CLINIC ONLY)
CD4 % Helper T Cell: 23 % — ABNORMAL LOW (ref 33–55)
CD4 T Cell Abs: 450 /uL (ref 400–2700)

## 2019-03-20 NOTE — Telephone Encounter (Signed)
Virtual Visit Pre-Appointment Phone Call  Steps For Call:  1. Confirm consent - "In the setting of the current Covid19 crisis, you are scheduled for a (phone or video) visit with your provider on (date) at (time).  Just as we do with many in-office visits, in order for you to participate in this visit, we must obtain consent.  If you'd like, I can send this to your mychart (if signed up) or email for you to review.  Otherwise, I can obtain your verbal consent now.  All virtual visits are billed to your insurance company just like a normal visit would be.  By agreeing to a virtual visit, we'd like you to understand that the technology does not allow for your provider to perform an examination, and thus may limit your provider's ability to fully assess your condition.  Finally, though the technology is pretty good, we cannot assure that it will always work on either your or our end, and in the setting of a video visit, we may have to convert it to a phone-only visit.  In either situation, we cannot ensure that we have a secure connection.  Are you willing to proceed?" STAFF: Did the patient verbally acknowledge consent to telehealth visit? Document YES/NO here: yes  2. Confirm the BEST phone number to call the day of the visit by including in appointment notes  3. Give patient instructions for WebEx/MyChart download to smartphone as below or Doximity/Doxy.me if video visit (depending on what platform provider is using)  4. Advise patient to be prepared with their blood pressure, heart rate, weight, any heart rhythm information, their current medicines, and a piece of paper and pen handy for any instructions they may receive the day of their visit  5. Inform patient they will receive a phone call 15 minutes prior to their appointment time (may be from unknown caller ID) so they should be prepared to answer  6. Confirm that appointment type is correct in Epic appointment notes (VIDEO vs PHONE)      TELEPHONE CALL NOTE  [facility] has been deemed a candidate for a follow-up tele-health visit to limit community exposure during the Covid-19 pandemic. I spoke with the patient via phone to ensure availability of phone/video source, confirm preferred email & phone number, and discuss instructions and expectations.  I reminded [facility] to be prepared with any vital sign and/or heart rhythm information that could potentially be obtained via home monitoring, at the time of his visit. I reminded [facility] to expect a phone call at the time of his visit if his visit.  [place], [place], [place] 03/20/2019 3:46 PM   INSTRUCTIONS FOR DOWNLOADING THE [employer] APP TO SMARTPHONE  - If Apple, ask patient to go to [employer] and type in WebEx in the search bar. [place] [employer], the blue/green circle. If Android, go to [employer] and type in [employer] in the search bar. The app is free but as with any other app downloads, their phone may require them to verify saved payment information or Apple/Android password.  - The patient does NOT have to create an account. - On the day of the visit, the assist will walk the patient through joining the meeting with the meeting number/password.  INSTRUCTIONS FOR DOWNLOADING THE MYCHART APP TO SMARTPHONE  - The patient must first make sure to have activated MyChart and know their login information - If Apple, go to [employer] and type in  MyChart in the search bar and download the app. If Android, ask patient to go to [employer] and type in [place] in the search bar and download the app. The app is free but as with any other app downloads, their phone may require them to verify saved payment information or Apple/Android password.  - The patient will need to then log into the app with their MyChart username and password, and select [place] as their healthcare provider to link the account. When it is time for your visit, go  to the MyChart app, find appointments, and click Begin Video Visit. Be sure to Select Allow for your device to access the Microphone and Camera for your visit. You will then be connected, and your provider will be with you shortly.  **If they have any issues connecting, or need assistance please contact MyChart service desk [phone])**  **If using a computer, in order to ensure the best quality for their visit they will need to use either of the following Internet Browsers: [employer], or Google Chrome**  IF USING DOXIMITY or DOXY.ME - The patient will receive a link just prior to their visit, either by text or email (to be determined day of appointment depending on if it's doxy.me or Doximity).     FULL LENGTH CONSENT FOR TELE-HEALTH VISIT   I hereby voluntarily request, consent and authorize [facility] and its employed or contracted physicians, physician assistants, nurse practitioners or other licensed health care professionals (the Practitioner), to provide me with telemedicine health care services (the "Services") as deemed necessary by the treating Practitioner. I acknowledge and consent to receive the Services by the Practitioner via telemedicine. I understand that the telemedicine visit will involve communicating with the Practitioner through live audiovisual communication technology and the disclosure of certain medical information by electronic transmission. I acknowledge that I have been given the opportunity to request an in-person assessment or other available alternative prior to the telemedicine visit and am voluntarily participating in the telemedicine visit.  I understand that I have the right to withhold or withdraw my consent to the use of telemedicine in the course of my care at any time, without affecting my right to future care or treatment, and that the Practitioner or I may terminate the telemedicine visit at any time. I understand that I have the right  to inspect all information obtained and/or recorded in the course of the telemedicine visit and may receive copies of available information for a reasonable fee.  I understand that some of the potential risks of receiving the Services via telemedicine include:  Marland Kitchen Delay or interruption in medical evaluation due to technological equipment failure or disruption; . Information transmitted may not be sufficient (e.g. poor resolution of images) to allow for appropriate medical decision making by the Practitioner; and/or  . In rare instances, security protocols could fail, causing a breach of personal health information.  Furthermore, I acknowledge that it is my responsibility to provide information about my medical history, conditions and care that is complete and accurate to the best of my ability. I acknowledge that Practitioner's advice, recommendations, and/or decision may be based on factors not within their control, such as incomplete or inaccurate data provided by me or distortions of diagnostic images or specimens that may result from electronic transmissions. I understand that the practice of medicine is not an exact science and that Practitioner makes no warranties or guarantees regarding treatment outcomes. I acknowledge that I will receive a copy  of this consent concurrently upon execution via email to the email address I last provided but may also request a printed copy by calling the office of [facility].    I understand that my insurance will be billed for this visit.   I have read or had this consent read to me. . I understand the contents of this consent, which adequately explains the benefits and risks of the Services being provided via telemedicine.  . I have been provided ample opportunity to ask questions regarding this consent and the Services and have had my questions answered to my satisfaction. . I give my informed consent for the services to be provided through the use of  telemedicine in my medical care  By participating in this telemedicine visit I agree to the above.

## 2019-03-21 ENCOUNTER — Ambulatory Visit (INDEPENDENT_AMBULATORY_CARE_PROVIDER_SITE_OTHER): Payer: Medicare HMO | Admitting: Family

## 2019-03-21 DIAGNOSIS — K559 Vascular disorder of intestine, unspecified: Secondary | ICD-10-CM

## 2019-03-21 DIAGNOSIS — I5022 Chronic systolic (congestive) heart failure: Secondary | ICD-10-CM | POA: Diagnosis not present

## 2019-03-21 DIAGNOSIS — N186 End stage renal disease: Secondary | ICD-10-CM

## 2019-03-21 LAB — CBC WITH DIFFERENTIAL/PLATELET
Absolute Monocytes: 767 cells/uL (ref 200–950)
Basophils Absolute: 84 cells/uL (ref 0–200)
Basophils Relative: 0.8 %
Eosinophils Absolute: 200 cells/uL (ref 15–500)
Eosinophils Relative: 1.9 %
HCT: 32.8 % — ABNORMAL LOW (ref 38.5–50.0)
Hemoglobin: 11.2 g/dL — ABNORMAL LOW (ref 13.2–17.1)
Lymphs Abs: 1964 cells/uL (ref 850–3900)
MCH: 35.7 pg — ABNORMAL HIGH (ref 27.0–33.0)
MCHC: 34.1 g/dL (ref 32.0–36.0)
MCV: 104.5 fL — ABNORMAL HIGH (ref 80.0–100.0)
MPV: 9.9 fL (ref 7.5–12.5)
Monocytes Relative: 7.3 %
Neutro Abs: 7487 cells/uL (ref 1500–7800)
Neutrophils Relative %: 71.3 %
Platelets: 449 10*3/uL — ABNORMAL HIGH (ref 140–400)
RBC: 3.14 10*6/uL — ABNORMAL LOW (ref 4.20–5.80)
RDW: 13.2 % (ref 11.0–15.0)
Total Lymphocyte: 18.7 %
WBC: 10.5 10*3/uL (ref 3.8–10.8)

## 2019-03-21 LAB — COMPLETE METABOLIC PANEL WITH GFR
AG Ratio: 1.3 (calc) (ref 1.0–2.5)
ALT: 44 U/L (ref 9–46)
AST: 34 U/L (ref 10–35)
Albumin: 3.9 g/dL (ref 3.6–5.1)
Alkaline phosphatase (APISO): 95 U/L (ref 35–144)
BUN/Creatinine Ratio: 4 (calc) — ABNORMAL LOW (ref 6–22)
BUN: 30 mg/dL — ABNORMAL HIGH (ref 7–25)
CO2: 27 mmol/L (ref 20–32)
Calcium: 9.3 mg/dL (ref 8.6–10.3)
Chloride: 96 mmol/L — ABNORMAL LOW (ref 98–110)
Creat: 7.71 mg/dL — ABNORMAL HIGH (ref 0.70–1.18)
GFR, Est African American: 7 mL/min/{1.73_m2} — ABNORMAL LOW (ref >=60)
GFR, Est Non African American: 6 mL/min/{1.73_m2} — ABNORMAL LOW (ref >=60)
Globulin: 3.1 g/dL (calc) (ref 1.9–3.7)
Glucose, Bld: 81 mg/dL (ref 65–99)
Potassium: 3.8 mmol/L (ref 3.5–5.3)
Sodium: 139 mmol/L (ref 135–146)
Total Bilirubin: 0.3 mg/dL (ref 0.2–1.2)
Total Protein: 7 g/dL (ref 6.1–8.1)

## 2019-03-21 LAB — HIV-1 RNA QUANT-NO REFLEX-BLD
HIV 1 RNA Quant: <20 copies/mL
HIV-1 RNA Quant, Log: <1.3 Log copies/mL

## 2019-03-21 LAB — FLUORESCENT TREPONEMAL AB(FTA)-IGG-BLD: Fluorescent Treponemal ABS: REACTIVE — AB

## 2019-03-21 LAB — RPR: RPR Ser Ql: REACTIVE — AB

## 2019-03-21 LAB — RPR TITER: RPR Titer: 1:2 {titer} — ABNORMAL HIGH

## 2019-03-21 NOTE — Telephone Encounter (Signed)
Copied from [place] [phone]. Topic: General - Other >> Mar 21, 2019  3:19 PM Keene Breath wrote: Reason for CRM: Patient is returning call regarding his virtual appt.  Please call patient back at [phone]

## 2019-03-21 NOTE — Telephone Encounter (Signed)
Patient scheduled for appointment today

## 2019-03-21 NOTE — Progress Notes (Addendum)
Virtual Visit via Video Note  I attempted a video visit with David [name] on 03/21/19 at  3:00 PM EDT by a video enabled telemedicine application and verified that I am speaking with the correct person using two identifiers. This visit type was conducted due to national recommendations for restrictions regarding the COVID-19 Pandemic (e.g. social distancing).  This format is felt to be most appropriate for this patient at this time.  The patient was unable to follow my instructions to use the video app link that I sent to him.  As a result we transitioned to a phone visit.    I discussed the limitations of evaluation and management by telemedicine and the availability of in person appointments. The patient expressed understanding and agreed to proceed.  Only the patient and myself were on today's video visit. The patient was at home and I was in my office at the time of today's visit.   History of Present Illness:   Was hospitalized 4/4-4/14/20 due Colitis. Discharge summary is reviewed.  He was treated with empiric abx. Ultimately it was felt that his symptoms were related to ischemic colitis following HD. He had a vascular surgery consult and they recommended general surgery consult if symptoms worsened. The patient's symptoms ultimately improved and he was discharged home.    Denies SOB, LE swelling, nausea/vomitting/diarrhea.  He reports that he is no longer having any abdominal pain.  ESRD- continues HD on Tuesdays/Thursdays/Saturdays.  CHF- no edema or sob.   LVEF 20% on most recent echo.  Observations/Objective:   Gen: Awake, alert, no acute distress Resp: Breathing sounds even and non-labored Psych: calm/pleasant demeanor Neuro: Alert and Oriented x 3, speech is clear.  Assessment and Plan:  CHF- clinically stable currently but fluid balance is very fragile.    ESRD- continues hemodialysis.  Colitis- clinically resolved  At the time of his hospital discharge a note was  made that an arrangement would be made for a home palliative care consult. He states that he has not been contacted.  He is agreeable to consult for palliative care which I think is reasonable.  I have placed the referral and told him to be expecting a call.   Follow Up Instructions:  15 minutes spent with patient on the phone today.    I discussed the assessment and treatment plan with the patient. The patient was provided an opportunity to ask questions and all were answered. The patient agreed with the plan and demonstrated an understanding of the instructions.   The patient was advised to call back or seek an in-person evaluation if the symptoms worsen or if the condition fails to improve as anticipated.    [name], NP

## 2019-03-22 DIAGNOSIS — J810 Acute pulmonary edema: Secondary | ICD-10-CM | POA: Diagnosis not present

## 2019-03-22 DIAGNOSIS — N186 End stage renal disease: Secondary | ICD-10-CM | POA: Diagnosis not present

## 2019-03-22 DIAGNOSIS — N2581 Secondary hyperparathyroidism of renal origin: Secondary | ICD-10-CM | POA: Diagnosis not present

## 2019-03-24 ENCOUNTER — Other Ambulatory Visit: Payer: Medicare HMO | Admitting: Internal Medicine

## 2019-03-24 ENCOUNTER — Telehealth: Payer: Self-pay

## 2019-03-24 ENCOUNTER — Other Ambulatory Visit: Payer: Self-pay

## 2019-03-24 DIAGNOSIS — Z515 Encounter for palliative care: Secondary | ICD-10-CM

## 2019-03-24 NOTE — Telephone Encounter (Signed)
Due to the current COVID-19 infection/crises, the patient and family prefer, and have given their verbal consent for, a provider visit via telemedicine. HIPPA policies of confidentially were discussed and patient/family expressed understanding.  Telephone visit scheduled for 03/24/2019 @ 3:30pm

## 2019-03-24 NOTE — Addendum Note (Signed)
Addended by: [name] on: 03/24/2019 01:02 PM   Modules accepted: Level of Service

## 2019-03-24 NOTE — Progress Notes (Signed)
[facility] Consult Note Telephone: [phone]  Fax: [phone]  PATIENT NAME: David [name] DOB: August 31, 1941 MRN: [id number]  PRIMARY CARE PROVIDER:   [name], NP  REFERRING PROVIDER:  [name], NP [address] [place], [place] [address]  RESPONSIBLE PARTY:   Self      RECOMMENDATIONS and PLAN:  Palliative Care Encounter  Z51.5  1.  Weakness:  Related to recent acute colitis, weight loss and hemodialysis. Consider restart of outpt. PT.  Patient opts to perform previously instructed exercises at this time. Balanced heart healthy meals.  Monitor for potential reoccurence of signs of colitis.   2.  Advanced care planning:  Introduction of palliative vs hospice care given to pt.  Reviewed his goals of care which are to continue hemodialysis, regain strength and restart light walking exercises.  Advanced directives also reviewed and patient desires full code with full scope of treatment.  Encouraged him to have this discussion with his wife and providers. Goals of care form completed for patient. Palliative care will followup with patient in aprox 1 month with his consent.   Due to the COVID-19 crisis, this telephone evaluation and treatment contact was performed via telephone from my office and it was initiated and consented by this patient and or family.  I spent 20 minutes providing this consultation,  from 1530 to 1550. More than 50% of the time in this consultation was spent coordinating communication with the patient and review of medical records.   HISTORY OF PRESENT ILLNESS:  David [name] is a 78 y.o. 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 Palliative Care was asked to help address goals  of care.   CODE STATUS: FULL CODE  PPS: 50% HOSPICE ELIGIBILITY/DIAGNOSIS: TBD  PAST MEDICAL HISTORY:  Past Medical History:  Diagnosis Date  . Acute on chronic systolic and diastolic heart failure, NYHA class 4 ([name])   . Anemia, iron deficiency 11/15/2011  . Arthritis    "hands, right knee, feet" (02/21/2018)  . Atrial fibrillation ([place])   . Cancer [facility])    hx of prostate; s/p radioactive seed implant 10/2009 Dr Janice Norrie  . Cardiac arrest ([place]) 02/17/2018  . CHF (congestive heart failure) ([place])   . Chronic lower back pain   . CKD (chronic kidney disease) stage V requiring chronic dialysis ([place])   . ESRD on dialysis ([place])    started 02/2018  . GERD (gastroesophageal reflux disease)   . Glaucoma   . Gout    daily RX (02/21/2018)  . Heart murmur    "mild" per pt  . Hepatitis    years ago  . History of cardiac cath 2004   negative for CAD  . History of myocardial perfusion scan 02/2010   negative for coronary insufficiency (LVEF 27%)  . HIV (human immunodeficiency virus infection) ([place])   . HIV infection ([place])   . Hyperlipidemia   . Hypertension    followed by [facility] and Vascular (Dr Dani Gobble [place]u)  . Hypertension   . Nonischemic cardiomyopathy ([place])   . Pneumonia 11/2017  . Prostate cancer ([place])   . Sinus bradycardia   . Sleep apnea    does not use a cpap  . Stroke [facility])    "mini stroke" years ago  . Systolic and  diastolic CHF, acute ([place]) 06/2010   felt to be secondary to hypertensive cardiomyopathy     PERTINENT MEDICATIONS:  Outpatient Encounter Medications as of 03/24/2019  Medication Sig  . acetaminophen (TYLENOL) 500 MG tablet Take 500-1,000 mg by mouth every 6 (six) hours as needed for mild pain.   Marland Kitchen allopurinol (ZYLOPRIM) 100 MG tablet TAKE 1 TABLET EVERY DAY  . amiodarone (PACERONE) 200 MG tablet TAKE 1 TABLET(200 MG) BY MOUTH DAILY  . AURYXIA 1 GM 210 MG(Fe) tablet Take 420 mg by mouth 3 (three) times daily with meals.   .  bictegravir-emtricitabine-tenofovir AF ([name]) 50-200-25 MG TABS tablet Take 1 tablet by mouth daily.  . brimonidine (ALPHAGAN) 0.15 % ophthalmic solution Place 1 drop into the left eye 2 (two) times daily.  . dorzolamide-timolol (COSOPT) 22.3-6.8 MG/ML ophthalmic solution Place 1 drop into the left eye 2 (two) times daily.  Marland Kitchen latanoprost (XALATAN) 0.005 % ophthalmic solution Place 1 drop into the left eye at bedtime.   Marland Kitchen loratadine (CLARITIN) 10 MG tablet Take 1 tablet (10 mg total) by mouth daily.  . midodrine (PROAMATINE) 10 MG tablet Take 1 tablet (10 mg total) by mouth as directed. Twice a day on the day of HD Tuesday, Thursday, Saturday (1 in AM and 1 at [place]). Take 1/2 tablet (5mg ) a day on non HD days  . multivitamin (RENA-VIT) TABS tablet Take 1 tablet by mouth daily.  Marland Kitchen warfarin (COUMADIN) 5 MG tablet Take 2 tablets by mouth daily or as directed by coumadin clinic (Patient taking differently: Take 5 mg by mouth daily. )   No facility-administered encounter medications on file as of 03/24/2019.     PHYSICAL EXAM:   General: NAD, unable to visualize patient Pulmonary: no increased effort Neurological: A&O x3  Gonzella Lex, NP-C

## 2019-03-25 ENCOUNTER — Emergency Department (HOSPITAL_COMMUNITY): Payer: Medicare HMO

## 2019-03-25 ENCOUNTER — Inpatient Hospital Stay (HOSPITAL_COMMUNITY)
Admission: EM | Admit: 2019-03-25 | Discharge: 2019-03-26 | DRG: 291 | Disposition: A | Discharge status: Home / Self Care | Payer: Medicare HMO | Attending: Internal Medicine | Admitting: Internal Medicine

## 2019-03-25 ENCOUNTER — Encounter (HOSPITAL_COMMUNITY): Payer: Self-pay

## 2019-03-25 ENCOUNTER — Telehealth: Payer: Self-pay

## 2019-03-25 DIAGNOSIS — Z992 Dependence on renal dialysis: Secondary | ICD-10-CM | POA: Diagnosis not present

## 2019-03-25 DIAGNOSIS — K559 Vascular disorder of intestine, unspecified: Secondary | ICD-10-CM | POA: Diagnosis present

## 2019-03-25 DIAGNOSIS — I499 Cardiac arrhythmia, unspecified: Secondary | ICD-10-CM | POA: Diagnosis not present

## 2019-03-25 DIAGNOSIS — Z8674 Personal history of sudden cardiac arrest: Secondary | ICD-10-CM | POA: Diagnosis not present

## 2019-03-25 DIAGNOSIS — R0602 Shortness of breath: Secondary | ICD-10-CM

## 2019-03-25 DIAGNOSIS — I420 Dilated cardiomyopathy: Secondary | ICD-10-CM | POA: Diagnosis not present

## 2019-03-25 DIAGNOSIS — N186 End stage renal disease: Secondary | ICD-10-CM | POA: Diagnosis not present

## 2019-03-25 DIAGNOSIS — Z79899 Other long term (current) drug therapy: Secondary | ICD-10-CM

## 2019-03-25 DIAGNOSIS — R0603 Acute respiratory distress: Secondary | ICD-10-CM | POA: Diagnosis not present

## 2019-03-25 DIAGNOSIS — Z8249 Family history of ischemic heart disease and other diseases of the circulatory system: Secondary | ICD-10-CM

## 2019-03-25 DIAGNOSIS — E785 Hyperlipidemia, unspecified: Secondary | ICD-10-CM | POA: Diagnosis present

## 2019-03-25 DIAGNOSIS — Z7901 Long term (current) use of anticoagulants: Secondary | ICD-10-CM | POA: Diagnosis not present

## 2019-03-25 DIAGNOSIS — Z888 Allergy status to other drugs, medicaments and biological substances status: Secondary | ICD-10-CM | POA: Diagnosis not present

## 2019-03-25 DIAGNOSIS — I447 Left bundle-branch block, unspecified: Secondary | ICD-10-CM | POA: Diagnosis not present

## 2019-03-25 DIAGNOSIS — Z8673 Personal history of transient ischemic attack (TIA), and cerebral infarction without residual deficits: Secondary | ICD-10-CM

## 2019-03-25 DIAGNOSIS — J810 Acute pulmonary edema: Secondary | ICD-10-CM | POA: Diagnosis not present

## 2019-03-25 DIAGNOSIS — Z21 Asymptomatic human immunodeficiency virus [HIV] infection status: Secondary | ICD-10-CM

## 2019-03-25 DIAGNOSIS — M109 Gout, unspecified: Secondary | ICD-10-CM | POA: Diagnosis present

## 2019-03-25 DIAGNOSIS — D509 Iron deficiency anemia, unspecified: Secondary | ICD-10-CM | POA: Diagnosis present

## 2019-03-25 DIAGNOSIS — Z87891 Personal history of nicotine dependence: Secondary | ICD-10-CM

## 2019-03-25 DIAGNOSIS — J811 Chronic pulmonary edema: Secondary | ICD-10-CM | POA: Diagnosis present

## 2019-03-25 DIAGNOSIS — M19042 Primary osteoarthritis, left hand: Secondary | ICD-10-CM | POA: Diagnosis present

## 2019-03-25 DIAGNOSIS — N2581 Secondary hyperparathyroidism of renal origin: Secondary | ICD-10-CM | POA: Diagnosis present

## 2019-03-25 DIAGNOSIS — I5022 Chronic systolic (congestive) heart failure: Secondary | ICD-10-CM | POA: Diagnosis present

## 2019-03-25 DIAGNOSIS — I4891 Unspecified atrial fibrillation: Secondary | ICD-10-CM

## 2019-03-25 DIAGNOSIS — I5043 Acute on chronic combined systolic (congestive) and diastolic (congestive) heart failure: Secondary | ICD-10-CM | POA: Diagnosis not present

## 2019-03-25 DIAGNOSIS — Z09 Encounter for follow-up examination after completed treatment for conditions other than malignant neoplasm: Secondary | ICD-10-CM

## 2019-03-25 DIAGNOSIS — R069 Unspecified abnormalities of breathing: Secondary | ICD-10-CM | POA: Diagnosis not present

## 2019-03-25 DIAGNOSIS — J9601 Acute respiratory failure with hypoxia: Secondary | ICD-10-CM | POA: Diagnosis not present

## 2019-03-25 DIAGNOSIS — K219 Gastro-esophageal reflux disease without esophagitis: Secondary | ICD-10-CM | POA: Diagnosis present

## 2019-03-25 DIAGNOSIS — I480 Paroxysmal atrial fibrillation: Secondary | ICD-10-CM | POA: Diagnosis not present

## 2019-03-25 DIAGNOSIS — D631 Anemia in chronic kidney disease: Secondary | ICD-10-CM | POA: Diagnosis not present

## 2019-03-25 DIAGNOSIS — G4730 Sleep apnea, unspecified: Secondary | ICD-10-CM | POA: Diagnosis present

## 2019-03-25 DIAGNOSIS — M545 Low back pain: Secondary | ICD-10-CM | POA: Diagnosis present

## 2019-03-25 DIAGNOSIS — Z20828 Contact with and (suspected) exposure to other viral communicable diseases: Secondary | ICD-10-CM | POA: Diagnosis present

## 2019-03-25 DIAGNOSIS — Z833 Family history of diabetes mellitus: Secondary | ICD-10-CM | POA: Diagnosis not present

## 2019-03-25 DIAGNOSIS — I959 Hypotension, unspecified: Secondary | ICD-10-CM | POA: Diagnosis not present

## 2019-03-25 DIAGNOSIS — Z8349 Family history of other endocrine, nutritional and metabolic diseases: Secondary | ICD-10-CM

## 2019-03-25 DIAGNOSIS — Z8701 Personal history of pneumonia (recurrent): Secondary | ICD-10-CM

## 2019-03-25 DIAGNOSIS — Z8546 Personal history of malignant neoplasm of prostate: Secondary | ICD-10-CM

## 2019-03-25 DIAGNOSIS — J80 Acute respiratory distress syndrome: Secondary | ICD-10-CM | POA: Diagnosis not present

## 2019-03-25 DIAGNOSIS — I110 Hypertensive heart disease with heart failure: Secondary | ICD-10-CM | POA: Diagnosis not present

## 2019-03-25 DIAGNOSIS — I132 Hypertensive heart and chronic kidney disease with heart failure and with stage 5 chronic kidney disease, or end stage renal disease: Secondary | ICD-10-CM | POA: Diagnosis not present

## 2019-03-25 DIAGNOSIS — B20 Human immunodeficiency virus [HIV] disease: Secondary | ICD-10-CM | POA: Diagnosis not present

## 2019-03-25 DIAGNOSIS — M19041 Primary osteoarthritis, right hand: Secondary | ICD-10-CM | POA: Diagnosis present

## 2019-03-25 DIAGNOSIS — R0902 Hypoxemia: Secondary | ICD-10-CM | POA: Diagnosis not present

## 2019-03-25 DIAGNOSIS — R000 Tachycardia, unspecified: Secondary | ICD-10-CM | POA: Diagnosis present

## 2019-03-25 DIAGNOSIS — M1711 Unilateral primary osteoarthritis, right knee: Secondary | ICD-10-CM | POA: Diagnosis present

## 2019-03-25 DIAGNOSIS — G8929 Other chronic pain: Secondary | ICD-10-CM | POA: Diagnosis present

## 2019-03-25 DIAGNOSIS — B942 Sequelae of viral hepatitis: Secondary | ICD-10-CM

## 2019-03-25 DIAGNOSIS — H409 Unspecified glaucoma: Secondary | ICD-10-CM | POA: Diagnosis present

## 2019-03-25 DIAGNOSIS — J449 Chronic obstructive pulmonary disease, unspecified: Secondary | ICD-10-CM

## 2019-03-25 LAB — CBC WITH DIFFERENTIAL/PLATELET
Abs Immature Granulocytes: 0.19 10*3/uL — ABNORMAL HIGH (ref 0.00–0.07)
Abs Immature Granulocytes: 0.09 10*3/uL — ABNORMAL HIGH (ref 0.00–0.07)
Basophils Absolute: 0.1 10*3/uL (ref 0.0–0.1)
Basophils Absolute: 0.1 10*3/uL (ref 0.0–0.1)
Basophils Relative: 1 %
Basophils Relative: 0 %
Eosinophils Absolute: 0.3 10*3/uL (ref 0.0–0.5)
Eosinophils Absolute: 0.1 10*3/uL (ref 0.0–0.5)
Eosinophils Relative: 1 %
Eosinophils Relative: 0 %
HCT: 36.5 % — ABNORMAL LOW (ref 39.0–52.0)
HCT: 36.2 % — ABNORMAL LOW (ref 39.0–52.0)
Hemoglobin: 11.3 g/dL — ABNORMAL LOW (ref 13.0–17.0)
Hemoglobin: 11.2 g/dL — ABNORMAL LOW (ref 13.0–17.0)
Immature Granulocytes: 1 %
Immature Granulocytes: 1 %
Lymphocytes Relative: 16 %
Lymphocytes Relative: 5 %
Lymphs Abs: 3.9 10*3/uL (ref 0.7–4.0)
Lymphs Abs: 0.9 10*3/uL (ref 0.7–4.0)
MCH: 34.3 pg — ABNORMAL HIGH (ref 26.0–34.0)
MCH: 34.5 pg — ABNORMAL HIGH (ref 26.0–34.0)
MCHC: 31 g/dL (ref 30.0–36.0)
MCHC: 30.9 g/dL (ref 30.0–36.0)
MCV: 110.9 fL — ABNORMAL HIGH (ref 80.0–100.0)
MCV: 111.4 fL — ABNORMAL HIGH (ref 80.0–100.0)
Monocytes Absolute: 1.1 10*3/uL — ABNORMAL HIGH (ref 0.1–1.0)
Monocytes Absolute: 1 10*3/uL (ref 0.1–1.0)
Monocytes Relative: 4 %
Monocytes Relative: 5 %
Neutro Abs: 18.6 10*3/uL — ABNORMAL HIGH (ref 1.7–7.7)
Neutro Abs: 17 10*3/uL — ABNORMAL HIGH (ref 1.7–7.7)
Neutrophils Relative %: 77 %
Neutrophils Relative %: 89 %
Platelets: 397 10*3/uL (ref 150–400)
Platelets: 310 10*3/uL (ref 150–400)
RBC: 3.29 MIL/uL — ABNORMAL LOW (ref 4.22–5.81)
RBC: 3.25 MIL/uL — ABNORMAL LOW (ref 4.22–5.81)
RDW: 14.4 % (ref 11.5–15.5)
RDW: 14.3 % (ref 11.5–15.5)
WBC: 24.1 10*3/uL — ABNORMAL HIGH (ref 4.0–10.5)
WBC: 19 10*3/uL — ABNORMAL HIGH (ref 4.0–10.5)
nRBC: 0 % (ref 0.0–0.2)
nRBC: 0 % (ref 0.0–0.2)

## 2019-03-25 LAB — LACTATE DEHYDROGENASE: LDH: 185 U/L (ref 98–192)

## 2019-03-25 LAB — RENAL FUNCTION PANEL
Albumin: 3.5 g/dL (ref 3.5–5.0)
Anion gap: 20 — ABNORMAL HIGH (ref 5–15)
BUN: 58 mg/dL — ABNORMAL HIGH (ref 8–23)
CO2: 19 mmol/L — ABNORMAL LOW (ref 22–32)
Calcium: 9.3 mg/dL (ref 8.9–10.3)
Chloride: 98 mmol/L (ref 98–111)
Creatinine, Ser: 9.57 mg/dL — ABNORMAL HIGH (ref 0.61–1.24)
GFR calc Af Amer: 5 mL/min — ABNORMAL LOW (ref >60)
GFR calc non Af Amer: 5 mL/min — ABNORMAL LOW (ref >60)
Glucose, Bld: 202 mg/dL — ABNORMAL HIGH (ref 70–99)
Phosphorus: 6.3 mg/dL — ABNORMAL HIGH (ref 2.5–4.6)
Potassium: 4.2 mmol/L (ref 3.5–5.1)
Sodium: 137 mmol/L (ref 135–145)

## 2019-03-25 LAB — D-DIMER, QUANTITATIVE: D-Dimer, Quant: 2.99 ug/mL-FEU — ABNORMAL HIGH (ref 0.00–0.50)

## 2019-03-25 LAB — LACTIC ACID, PLASMA
Lactic Acid, Venous: 2.7 mmol/L (ref 0.5–1.9)
Lactic Acid, Venous: 3.4 mmol/L (ref 0.5–1.9)
Lactic Acid, Venous: 1.1 mmol/L (ref 0.5–1.9)
Lactic Acid, Venous: 0.9 mmol/L (ref 0.5–1.9)

## 2019-03-25 LAB — C-REACTIVE PROTEIN: CRP: 5 mg/dL — ABNORMAL HIGH (ref <1.0)

## 2019-03-25 LAB — BASIC METABOLIC PANEL
Anion gap: 21 — ABNORMAL HIGH (ref 5–15)
BUN: 59 mg/dL — ABNORMAL HIGH (ref 8–23)
CO2: 21 mmol/L — ABNORMAL LOW (ref 22–32)
Calcium: 9.4 mg/dL (ref 8.9–10.3)
Chloride: 96 mmol/L — ABNORMAL LOW (ref 98–111)
Creatinine, Ser: 10.09 mg/dL — ABNORMAL HIGH (ref 0.61–1.24)
GFR calc Af Amer: 5 mL/min — ABNORMAL LOW (ref >60)
GFR calc non Af Amer: 4 mL/min — ABNORMAL LOW (ref >60)
Glucose, Bld: 160 mg/dL — ABNORMAL HIGH (ref 70–99)
Potassium: 4.7 mmol/L (ref 3.5–5.1)
Sodium: 138 mmol/L (ref 135–145)

## 2019-03-25 LAB — GLUCOSE, CAPILLARY
Glucose-Capillary: 105 mg/dL — ABNORMAL HIGH (ref 70–99)
Glucose-Capillary: 103 mg/dL — ABNORMAL HIGH (ref 70–99)
Glucose-Capillary: 88 mg/dL (ref 70–99)
Glucose-Capillary: 110 mg/dL — ABNORMAL HIGH (ref 70–99)

## 2019-03-25 LAB — SARS CORONAVIRUS 2 BY RT PCR (HOSPITAL ORDER, PERFORMED IN ~~LOC~~ HOSPITAL LAB): SARS Coronavirus 2: NEGATIVE

## 2019-03-25 LAB — PROTIME-INR
INR: 1.9 — ABNORMAL HIGH (ref 0.8–1.2)
Prothrombin Time: 21.2 seconds — ABNORMAL HIGH (ref 11.4–15.2)

## 2019-03-25 LAB — PROCALCITONIN: Procalcitonin: <0.1 ng/mL

## 2019-03-25 LAB — CBG MONITORING, ED: Glucose-Capillary: 116 mg/dL — ABNORMAL HIGH (ref 70–99)

## 2019-03-25 LAB — FERRITIN: Ferritin: 1078 ng/mL — ABNORMAL HIGH (ref 24–336)

## 2019-03-25 LAB — TRIGLYCERIDES: Triglycerides: 116 mg/dL (ref <150)

## 2019-03-25 LAB — FIBRINOGEN: Fibrinogen: 748 mg/dL — ABNORMAL HIGH (ref 210–475)

## 2019-03-25 MED ORDER — SODIUM CHLORIDE 0.9 % IV SOLN
2.0000 g | INTRAVENOUS | Status: AC
  Administered 2019-03-25: 2 g via INTRAVENOUS
  Filled 2019-03-25 (×3): qty 2

## 2019-03-25 MED ORDER — SODIUM CHLORIDE 0.9 % IV SOLN: 100.0000 mL | INTRAVENOUS | Status: DC | PRN

## 2019-03-25 MED ORDER — SODIUM CHLORIDE 0.9 % IV SOLN: 250.0000 mL | INTRAVENOUS | Status: DC | PRN

## 2019-03-25 MED ORDER — SODIUM CHLORIDE 0.9 % IV SOLN
2.0000 g | Freq: Once | INTRAVENOUS | Status: AC
  Administered 2019-03-25: 04:00:00 2 g via INTRAVENOUS
  Filled 2019-03-25: qty 2

## 2019-03-25 MED ORDER — WARFARIN SODIUM 7.5 MG PO TABS
7.5000 mg | ORAL_TABLET | Freq: Once | ORAL | Status: AC
  Administered 2019-03-25: 7.5 mg via ORAL
  Filled 2019-03-25: qty 1

## 2019-03-25 MED ORDER — AMIODARONE HCL IN DEXTROSE 360-4.14 MG/200ML-% IV SOLN
60.0000 mg/h | INTRAVENOUS | Status: AC
  Administered 2019-03-25 (×2): 60 mg/h via INTRAVENOUS
  Filled 2019-03-25 (×2): qty 200

## 2019-03-25 MED ORDER — BICTEGRAVIR-EMTRICITAB-TENOFOV 50-200-25 MG PO TABS
1.0000 | ORAL_TABLET | Freq: Every day | ORAL | Status: DC
  Administered 2019-03-25 – 2019-03-26 (×2): 1 via ORAL
  Filled 2019-03-25 (×3): qty 1

## 2019-03-25 MED ORDER — NITROGLYCERIN 0.4 MG SL SUBL
SUBLINGUAL_TABLET | SUBLINGUAL | Status: AC
  Administered 2019-03-25: 0.8 mg
  Filled 2019-03-25: qty 1

## 2019-03-25 MED ORDER — LIDOCAINE-PRILOCAINE 2.5-2.5 % EX CREA: 1.0000 "application " | TOPICAL_CREAM | CUTANEOUS | Status: DC | PRN

## 2019-03-25 MED ORDER — ALBUTEROL SULFATE (2.5 MG/3ML) 0.083% IN NEBU: 3.0000 mL | INHALATION_SOLUTION | Freq: Four times a day (QID) | RESPIRATORY_TRACT | Status: DC | PRN

## 2019-03-25 MED ORDER — CHLORHEXIDINE GLUCONATE CLOTH 2 % EX PADS
6.0000 | MEDICATED_PAD | Freq: Every day | CUTANEOUS | Status: DC
  Administered 2019-03-25 – 2019-03-26 (×2): 6 via TOPICAL

## 2019-03-25 MED ORDER — MIDODRINE HCL 5 MG PO TABS
10.0000 mg | ORAL_TABLET | Freq: Once | ORAL | Status: AC
  Administered 2019-03-25: 10 mg via ORAL
  Filled 2019-03-25: qty 2

## 2019-03-25 MED ORDER — VANCOMYCIN HCL 10 G IV SOLR
1500.0000 mg | Freq: Once | INTRAVENOUS | Status: AC
  Administered 2019-03-25: 1500 mg via INTRAVENOUS
  Filled 2019-03-25: qty 1500

## 2019-03-25 MED ORDER — FERRIC CITRATE 1 GM 210 MG(FE) PO TABS
420.0000 mg | ORAL_TABLET | Freq: Three times a day (TID) | ORAL | Status: DC
  Administered 2019-03-26: 08:00:00 420 mg via ORAL
  Filled 2019-03-25 (×4): qty 2

## 2019-03-25 MED ORDER — AMIODARONE LOAD VIA INFUSION
150.0000 mg | Freq: Once | INTRAVENOUS | Status: AC
  Administered 2019-03-25: 150 mg via INTRAVENOUS
  Filled 2019-03-25: qty 83.34

## 2019-03-25 MED ORDER — PENTAFLUOROPROP-TETRAFLUOROETH EX AERO: 1.0000 "application " | INHALATION_SPRAY | CUTANEOUS | Status: DC | PRN

## 2019-03-25 MED ORDER — NITROGLYCERIN IN D5W 200-5 MCG/ML-% IV SOLN
INTRAVENOUS | Status: AC
  Filled 2019-03-25: qty 250

## 2019-03-25 MED ORDER — AMIODARONE HCL IN DEXTROSE 360-4.14 MG/200ML-% IV SOLN
30.0000 mg/h | INTRAVENOUS | Status: DC
  Administered 2019-03-25 – 2019-03-26 (×2): 30 mg/h via INTRAVENOUS
  Filled 2019-03-25 (×4): qty 200

## 2019-03-25 MED ORDER — ALTEPLASE 2 MG IJ SOLR: 2.0000 mg | Freq: Once | INTRAMUSCULAR | Status: DC | PRN

## 2019-03-25 MED ORDER — SODIUM CHLORIDE 0.9% FLUSH
3.0000 mL | Freq: Two times a day (BID) | INTRAVENOUS | Status: DC
  Administered 2019-03-25 – 2019-03-26 (×2): 3 mL via INTRAVENOUS

## 2019-03-25 MED ORDER — ALLOPURINOL 100 MG PO TABS
100.0000 mg | ORAL_TABLET | Freq: Every day | ORAL | Status: DC
  Administered 2019-03-25 – 2019-03-26 (×2): 100 mg via ORAL
  Filled 2019-03-25 (×2): qty 1

## 2019-03-25 MED ORDER — HEPARIN SODIUM (PORCINE) 1000 UNIT/ML DIALYSIS: 1000.0000 [IU] | INTRAMUSCULAR | Status: DC | PRN

## 2019-03-25 MED ORDER — SODIUM CHLORIDE 0.9% FLUSH: 3.0000 mL | INTRAVENOUS | Status: DC | PRN

## 2019-03-25 MED ORDER — VANCOMYCIN HCL 1000 MG IV SOLR
1000.0000 mg | INTRAVENOUS | Status: DC
  Filled 2019-03-25 (×2): qty 1000

## 2019-03-25 MED ORDER — HEPARIN SODIUM (PORCINE) 5000 UNIT/ML IJ SOLN
5000.0000 [IU] | Freq: Three times a day (TID) | INTRAMUSCULAR | Status: DC
  Administered 2019-03-25 – 2019-03-26 (×3): 5000 [IU] via SUBCUTANEOUS
  Filled 2019-03-25 (×3): qty 1

## 2019-03-25 MED ORDER — WARFARIN - PHARMACIST DOSING INPATIENT: Freq: Every day | Status: DC

## 2019-03-25 MED ORDER — ACETAMINOPHEN 650 MG RE SUPP: 650.0000 mg | Freq: Four times a day (QID) | RECTAL | Status: DC | PRN

## 2019-03-25 MED ORDER — SODIUM CHLORIDE 0.9 % IV BOLUS
500.0000 mL | Freq: Once | INTRAVENOUS | Status: AC
  Administered 2019-03-25: 500 mL via INTRAVENOUS

## 2019-03-25 MED ORDER — LIDOCAINE HCL (PF) 1 % IJ SOLN: 5.0000 mL | INTRAMUSCULAR | Status: DC | PRN

## 2019-03-25 MED ORDER — VANCOMYCIN VARIABLE DOSE PER UNSTABLE RENAL FUNCTION (PHARMACIST DOSING): Status: DC

## 2019-03-25 MED ORDER — NITROGLYCERIN 0.4 MG SL SUBL
SUBLINGUAL_TABLET | SUBLINGUAL | Status: AC
  Administered 2019-03-25: 02:00:00 0.4 mg
  Filled 2019-03-25: qty 1

## 2019-03-25 MED ORDER — ONDANSETRON HCL 4 MG PO TABS: 4.0000 mg | ORAL_TABLET | Freq: Four times a day (QID) | ORAL | Status: DC | PRN

## 2019-03-25 MED ORDER — SODIUM CHLORIDE 0.9% FLUSH
3.0000 mL | Freq: Two times a day (BID) | INTRAVENOUS | Status: DC
  Administered 2019-03-26: 3 mL via INTRAVENOUS

## 2019-03-25 MED ORDER — ONDANSETRON HCL 4 MG/2ML IJ SOLN: 4.0000 mg | Freq: Four times a day (QID) | INTRAMUSCULAR | Status: DC | PRN

## 2019-03-25 MED ORDER — AMIODARONE HCL IN DEXTROSE 360-4.14 MG/200ML-% IV SOLN
INTRAVENOUS | Status: AC
  Filled 2019-03-25: qty 200

## 2019-03-25 MED ORDER — INSULIN ASPART 100 UNIT/ML ~~LOC~~ SOLN: 0.0000 [IU] | SUBCUTANEOUS | Status: DC

## 2019-03-25 MED ORDER — ACETAMINOPHEN 325 MG PO TABS: 650.0000 mg | ORAL_TABLET | Freq: Four times a day (QID) | ORAL | Status: DC | PRN

## 2019-03-25 MED ORDER — VANCOMYCIN HCL IN DEXTROSE 1-5 GM/200ML-% IV SOLN: 1000.0000 mg | Freq: Once | INTRAVENOUS | Status: DC

## 2019-03-25 MED ORDER — VANCOMYCIN HCL IN DEXTROSE 1-5 GM/200ML-% IV SOLN
1000.0000 mg | INTRAVENOUS | Status: AC
  Administered 2019-03-25: 1000 mg via INTRAVENOUS
  Filled 2019-03-25 (×2): qty 200

## 2019-03-25 NOTE — Progress Notes (Signed)
Dialysis Coordinator notified OP HD clinic staff of patient's negative COVID 19 test result to ensure safety and continuity of care.  Alphonzo Cruise Dialysis Coordinator [phone]

## 2019-03-25 NOTE — ED Notes (Signed)
Attempted report x1. 

## 2019-03-25 NOTE — ED Notes (Signed)
ED TO INPATIENT HANDOFF REPORT  ED Nurse Name and Phone #: David [name], [place] Name/Age/Gender [facility] 78 y.o. male Room/Bed: 034C/034C  Code Status   Code Status: Prior  Home/SNF/Other Home Patient oriented to: self, place, time and situation Is this baseline? Yes   Triage Complete: Triage complete  Chief Complaint SOB DIALYSIS  Triage Note Pt with history of CHF reportts shortness of breath, Ems reports 86% o2 sats on RA, blood pressure in 179/113, 118CBG, 40RR   Allergies Allergies  Allergen Reactions  . Cozaar [Losartan Potassium] Other (See Comments)    Causes constipation     Level of Care/Admitting Diagnosis ED Disposition    ED Disposition Condition [facility] Area: [place] [[id number]]  Level of Care: Progressive [102]  I expect the patient will be discharged within 24 hours: No (not a candidate for 5C-Observation unit)  Covid Evaluation: N/A  Diagnosis: Acute respiratory failure with hypoxia [facility]) [[id number]]  Admitting Physician: Vianne Bulls [[id number]]  Attending Physician: Vianne Bulls [[id number]]  PT Class (Do Not Modify): Observation [104]  PT Acc Code (Do Not Modify): Observation [10022]       B Medical/Surgery History Past Medical History:  Diagnosis Date  . Acute on chronic systolic and diastolic heart failure, NYHA class 4 ([place])   . Anemia, iron deficiency 11/15/2011  . Arthritis    "hands, right knee, feet" (02/21/2018)  . Atrial fibrillation ([place])   . Cancer [facility])    hx of prostate; s/p radioactive seed implant 10/2009 Dr Janice Norrie  . Cardiac arrest ([place]) 02/17/2018  . CHF (congestive heart failure) ([place])   . Chronic lower back pain   . CKD (chronic kidney disease) stage V requiring chronic dialysis ([place])   . ESRD on dialysis ([place])    started 02/2018  . GERD (gastroesophageal reflux disease)   . Glaucoma   . Gout    daily RX (02/21/2018)  . Heart murmur    "mild" per pt  . Hepatitis     years ago  . History of cardiac cath 2004   negative for CAD  . History of myocardial perfusion scan 02/2010   negative for coronary insufficiency (LVEF 27%)  . HIV (human immunodeficiency virus infection) ([place])   . HIV infection ([place])   . Hyperlipidemia   . Hypertension    followed by [facility] and Vascular (Dr Dani Gobble Croitoru)  . Hypertension   . Nonischemic cardiomyopathy ([place])   . Pneumonia 11/2017  . Prostate cancer ([place])   . Sinus bradycardia   . Sleep apnea    does not use a cpap  . Stroke [facility])    "mini stroke" years ago  . Systolic and diastolic CHF, acute ([place]) 06/2010   felt to be secondary to hypertensive cardiomyopathy   Past Surgical History:  Procedure Laterality Date  . AV FISTULA PLACEMENT Right 10/13/2016   Procedure: ARTERIOVENOUS (AV) FISTULA CREATION;  Surgeon: [name], MD;  Location: [facility];  Service: Vascular;  Laterality: Right;  . AV FISTULA PLACEMENT Right 10/13/2016   [name] [phone]  . AV FISTULA PLACEMENT Left 02/25/2018   Procedure: INSERTION OF ARTERIOVENOUS (AV) GORE-TEX GRAFT LEFT UPPER ARM;  Surgeon: Angelia Mould, MD;  Location: [facility];  Service: Vascular;  Laterality: Left;  . AV FISTULA PLACEMENT Right 08/07/2018   Procedure: Creation of right arm brachiocephalic Fistula;  Surgeon: Waynetta Sandy, MD;  Location: [facility];  Service: Vascular;  Laterality: Right;  .  BASCILIC VEIN TRANSPOSITION Left 06/24/2015   Procedure: BASILIC VEIN TRANSPOSITION;  Surgeon: Mal Misty, MD;  Location: [facility];  Service: Vascular;  Laterality: Left;  . BASCILIC VEIN TRANSPOSITION Left 03/05/271 Bascilic vein transposition (Left)   [name] [phone]  . BIOPSY  03/14/2019   Procedure: BIOPSY;  Surgeon: Irene Shipper, MD;  Location: [facility] ENDOSCOPY;  Service: Endoscopy;;  . CARDIAC CATHETERIZATION  02/04/2003   mildly depressed LV systolic fx EF 74%,[id number] coronaries/abdominal aorta/renal arteries.  Marland Kitchen CARDIAC CATHETERIZATION  2004  . CATARACT  EXTRACTION, BILATERAL Bilateral   . COLONOSCOPY    . COLONOSCOPY WITH PROPOFOL N/A 03/14/2019   Procedure: COLONOSCOPY WITH PROPOFOL;  Surgeon: Irene Shipper, MD;  Location: [facility] ENDOSCOPY;  Service: Endoscopy;  Laterality: N/A;  . ESOPHAGOGASTRODUODENOSCOPY (EGD) WITH PROPOFOL N/A 05/05/2014   Procedure: ESOPHAGOGASTRODUODENOSCOPY (EGD) WITH PROPOFOL;  Surgeon: Missy Sabins, MD;  Location: [facility] ENDOSCOPY;  Service: Endoscopy;  Laterality: N/A;  . EYE SURGERY Bilateral    cataract surgery   . EYE SURGERY Bilateral    glaucoma surgery  . EYE SURGERY    . GLAUCOMA SURGERY Bilateral   . INSERTION OF ARTERIOVENOUS (AV) ARTEGRAFT ARM  10/09/2018   Procedure: INSERTION OF ARTERIOVENOUS (AV) 55mm x 41cm ARTEGRAFT LEFT UPPER ARM;  Surgeon: Waynetta Sandy, MD;  Location: [facility];  Service: Vascular;;  . INSERTION PROSTATE RADIATION SEED    . IR FLUORO GUIDE CV LINE RIGHT  02/18/2018  . IR REMOVAL TUN CV CATH W/O FL  12/06/2018  . IR US GUIDE VASC ACCESS RIGHT  02/18/2018  . LEFT HEART CATH AND CORONARY ANGIOGRAPHY N/A 02/20/2018   Procedure: LEFT HEART CATH AND CORONARY ANGIOGRAPHY;  Surgeon: Jettie Booze, MD;  Location: [facility] CV LAB;  Service: Cardiovascular;  Laterality: N/A;  . NM MYOCAR PERF WALL MOTION  02/21/2010   normal  . RADIOACTIVE SEED IMPLANT  2010   prostate cancer  . THROMBECTOMY W/ EMBOLECTOMY Left 02/26/2018   Procedure: THROMBECTOMY ARTERIOVENOUS GRAFT;  Surgeon: Angelia Mould, MD;  Location: [facility];  Service: Vascular;  Laterality: Left;  . ULTRASOUND GUIDANCE FOR VASCULAR ACCESS  02/20/2018   Procedure: Ultrasound Guidance For Vascular Access;  Surgeon: Jettie Booze, MD;  Location: [place] CV LAB;  Service: Cardiovascular;;  . UPPER EXTREMITY VENOGRAPHY N/A 07/08/2018   Procedure: UPPER EXTREMITY VENOGRAPHY;  Surgeon: Waynetta Sandy, MD;  Location: [place] CV LAB;  Service: Cardiovascular;  Laterality: N/A;  Bilateral  . US  ECHOCARDIOGRAPHY  02/06/2012   mild LVH,LA mod. dilated,mild-mod. 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 AI.     A IV Location/Drains/Wounds Patient Lines/Drains/Airways Status   Active Line/Drains/Airways    Name:   Placement date:   Placement time:   Site:   Days:   Peripheral IV 03/25/19 Left Antecubital   03/25/19    0214    Antecubital   less than 1   Fistula / Graft Right Upper arm   -    -    Upper arm             Intake/Output Last 24 hours No intake or output data in the 24 hours ending 03/25/19 0413  Labs/Imaging Results for orders placed or performed during the hospital encounter of 03/25/19 (from the past 48 hour(s))  Renal function panel     Status: Abnormal   Collection Time: 03/25/19  1:48 AM  Result Value Ref Range   Sodium 137 135 - 145  mmol/L   Potassium 4.2 3.5 - 5.1 mmol/L   Chloride 98 98 - 111 mmol/L   CO2 19 (L) 22 - 32 mmol/L   Glucose, Bld 202 (H) 70 - 99 mg/dL   BUN 58 (H) 8 - 23 mg/dL   Creatinine, Ser 9.57 (H) 0.61 - 1.24 mg/dL   Calcium 9.3 8.9 - 10.3 mg/dL   Phosphorus 6.3 (H) 2.5 - 4.6 mg/dL   Albumin 3.5 3.5 - 5.0 g/dL   GFR calc non Af Amer 5 (L) >60 mL/min   GFR calc Af Amer 5 (L) >60 mL/min   Anion gap 20 (H) 5 - 15    Comment: Performed at [facility] [address], [place], [place] [address]  Lactic acid, plasma     Status: Abnormal   Collection Time: 03/25/19  1:48 AM  Result Value Ref Range   Lactic Acid, Venous 2.7 (HH) 0.5 - 1.9 mmol/L    Comment: CRITICAL RESULT CALLED TO, READ BACK BY AND VERIFIED WITH:  STRAUGHN,K RN 03/24/2019 0313 JORDANS Performed at [facility], [place] [address], [place], [place] [address]   Triglycerides     Status: None   Collection Time: 03/25/19  1:48 AM  Result Value Ref Range   Triglycerides 116 <150 mg/dL    Comment: Performed at [facility] [address], [place], [place] [address]  SARS Coronavirus 2 [facility] order, Performed in [facility]  hospital lab)     Status: None   Collection Time: 03/25/19  2:03 AM  Result Value Ref Range   SARS Coronavirus 2 NEGATIVE NEGATIVE    Comment: (NOTE) If result is NEGATIVE SARS-CoV-2 target nucleic acids are NOT DETECTED. The SARS-CoV-2 RNA is generally detectable in upper and lower  respiratory specimens during the acute phase of infection. The lowest  concentration of SARS-CoV-2 viral copies this assay can detect is 250  copies / mL. A negative result does not preclude SARS-CoV-2 infection  and should not be used as the sole basis for treatment or other  patient management decisions.  A negative result may occur with  improper specimen collection / handling, submission of specimen other  than nasopharyngeal swab, presence of viral mutation(s) within the  areas targeted by this assay, and inadequate number of viral copies  (<250 copies / mL). A negative result must be combined with clinical  observations, patient history, and epidemiological information. If result is POSITIVE SARS-CoV-2 target nucleic acids are DETECTED. The SARS-CoV-2 RNA is generally detectable in upper and lower  respiratory specimens dur ing the acute phase of infection.  Positive  results are indicative of active infection with SARS-CoV-2.  Clinical  correlation with patient history and other diagnostic information is  necessary to determine patient infection status.  Positive results do  not rule out bacterial infection or co-infection with other viruses. If result is PRESUMPTIVE POSTIVE SARS-CoV-2 nucleic acids MAY BE PRESENT.   A presumptive positive result was obtained on the submitted specimen  and confirmed on repeat testing.  While 2019 novel coronavirus  (SARS-CoV-2) nucleic acids may be present in the submitted sample  additional confirmatory testing may be necessary for epidemiological  and / or clinical management purposes  to differentiate between  SARS-CoV-2 and other Sarbecovirus currently known  to infect humans.  If clinically indicated additional testing with an alternate test  methodology [phone]) is advised. The SARS-CoV-2 RNA is generally  detectable in upper and lower respiratory sp ecimens during the acute  phase of infection. The expected result is Negative. Fact Sheet for Patients:  StrictlyIdeas.no Fact Sheet for Healthcare Providers: BankingDealers.co.za This test is not yet approved or cleared by the [place] FDA and has been authorized for detection and/or diagnosis of SARS-CoV-2 by FDA under an Emergency Use Authorization (EUA).  This EUA will remain in effect (meaning this test can be used) for the duration of the COVID-19 declaration under Section 564(b)(1) of the Act, 21 U.S.C. section 360bbb-3(b)(1), unless the authorization is terminated or revoked sooner. Performed at [facility], [place] [address] [place], [place] [address]   CBC with Differential/Platelet     Status: Abnormal   Collection Time: 03/25/19  2:15 AM  Result Value Ref Range   WBC 24.1 (H) 4.0 - 10.5 K/uL   RBC 3.29 (L) 4.22 - 5.81 MIL/uL   Hemoglobin 11.3 (L) 13.0 - 17.0 g/dL   HCT 36.5 (L) 39.0 - 52.0 %   MCV 110.9 (H) 80.0 - 100.0 fL   MCH 34.3 (H) 26.0 - 34.0 pg   MCHC 31.0 30.0 - 36.0 g/dL   RDW 14.4 11.5 - 15.5 %   Platelets 397 150 - 400 K/uL   nRBC 0.0 0.0 - 0.2 %   Neutrophils Relative % 77 %   Neutro Abs 18.6 (H) 1.7 - 7.7 K/uL   Lymphocytes Relative 16 %   Lymphs Abs 3.9 0.7 - 4.0 K/uL   Monocytes Relative 4 %   Monocytes Absolute 1.1 (H) 0.1 - 1.0 K/uL   Eosinophils Relative 1 %   Eosinophils Absolute 0.3 0.0 - 0.5 K/uL   Basophils Relative 1 %   Basophils Absolute 0.1 0.0 - 0.1 K/uL   Immature Granulocytes 1 %   Abs Immature Granulocytes 0.19 (H) 0.00 - 0.07 K/uL    Comment: Performed at [facility] [address], [place], [place] [address]  Procalcitonin     Status: None   Collection Time: 03/25/19   2:15 AM  Result Value Ref Range   Procalcitonin <0.10 ng/mL    Comment:        Interpretation: PCT (Procalcitonin) <= 0.5 ng/mL: Systemic infection (sepsis) is not likely. Local bacterial infection is possible. (NOTE)       Sepsis PCT Algorithm           Lower Respiratory Tract                                      Infection PCT Algorithm    ----------------------------     ----------------------------         PCT < 0.25 ng/mL                PCT < 0.10 ng/mL         Strongly encourage             Strongly discourage   discontinuation of antibiotics    initiation of antibiotics    ----------------------------     -----------------------------       PCT 0.25 - 0.50 ng/mL            PCT 0.10 - 0.25 ng/mL               OR       >80% decrease in PCT            Discourage initiation of  antibiotics      Encourage discontinuation           of antibiotics    ----------------------------     -----------------------------         PCT >= 0.50 ng/mL              PCT 0.26 - 0.50 ng/mL               AND        <80% decrease in PCT             Encourage initiation of                                             antibiotics       Encourage continuation           of antibiotics    ----------------------------     -----------------------------        PCT >= 0.50 ng/mL                  PCT > 0.50 ng/mL               AND         increase in PCT                  Strongly encourage                                      initiation of antibiotics    Strongly encourage escalation           of antibiotics                                     -----------------------------                                           PCT <= 0.25 ng/mL                                                 OR                                        > 80% decrease in PCT                                     Discontinue / Do not initiate                                              antibiotics Performed at [facility], [place] [address], [place], [place] [address]   Lactate dehydrogenase     Status: None   Collection Time: 03/25/19  2:15 AM  Result Value Ref Range   LDH 185 98 - 192 U/L    Comment: Performed at [facility], [place] [address], [place], [place] [address]  Ferritin     Status: Abnormal   Collection Time: 03/25/19  2:15 AM  Result Value Ref Range   Ferritin 1,078 (H) 24 - 336 ng/mL    Comment: Performed at [facility], [place] [address] [place], [place] [address]  C-reactive protein     Status: Abnormal   Collection Time: 03/25/19  2:15 AM  Result Value Ref Range   CRP 5.0 (H) <1.0 mg/dL    Comment: Performed at [facility] [address] [place], [place] [address]  D-dimer, quantitative     Status: Abnormal   Collection Time: 03/25/19  2:30 AM  Result Value Ref Range   D-Dimer, Quant 2.99 (H) 0.00 - 0.50 ug/mL-FEU    Comment: (NOTE) At the manufacturer cut-off of 0.50 ug/mL FEU, this assay has been documented to exclude PE with a sensitivity and negative predictive value of 97 to 99%.  At this time, this assay has not been approved by the FDA to exclude DVT/VTE. Results should be correlated with clinical presentation. Performed at [facility], [place] [address], [place], [place] [address]   Fibrinogen     Status: Abnormal   Collection Time: 03/25/19  2:30 AM  Result Value Ref Range   Fibrinogen 748 (H) 210 - 475 mg/dL    Comment: Performed at [facility] [address] [place], [place] [address]  Protime-INR     Status: Abnormal   Collection Time: 03/25/19  2:30 AM  Result Value Ref Range   Prothrombin Time 21.2 (H) 11.4 - 15.2 seconds   INR 1.9 (H) 0.8 - 1.2    Comment: (NOTE) INR goal varies based on device and disease states. Performed at [facility], [place] [address] [place], [place] [address]    Dg Chest Port 1 View  Result Date: 03/25/2019 CLINICAL DATA:  Shortness of breath EXAM:  PORTABLE CHEST 1 VIEW COMPARISON:  03/05/2019 FINDINGS: Cardiomegaly. Bilateral airspace disease, right greater than left. This could reflect asymmetric edema or infection. No visible effusions or acute bony abnormality. IMPRESSION: Bilateral airspace disease, right greater than left, asymmetric edema versus infection. Electronically Signed   By: Rolm Baptise M.D.   On: 03/25/2019 02:25    Pending Labs Unresulted Labs (From admission, onward)    Start     Ordered   03/25/19 0339  Urinalysis, Routine w reflex microscopic  ONCE - STAT,   STAT     03/25/19 0339   03/25/19 0148  Lactic acid, plasma  Now then every 2 hours,   STAT     03/25/19 0149   03/25/19 0148  Blood Culture (routine x 2)  BLOOD CULTURE X 2,   STAT    Question:  Patient immune status  Answer:  Normal   03/25/19 0149   03/25/19 0148  CBC WITH DIFFERENTIAL  ONCE - STAT,   STAT     03/25/19 0149          Vitals/Pain Today's Vitals   03/25/19 0245 03/25/19 0300 03/25/19 0323 03/25/19 0400  BP: 107/75 112/76 123/83 122/82  Pulse:   (!) 108   Resp: (!) 26 (!) 27 (!) 27 (!) 24  Temp:      TempSrc:      SpO2:   100%  Isolation Precautions No active isolations  Medications Medications  nitroGLYCERIN 0.2 mg/mL in dextrose 5 % infusion (has no administration in time range)  amiodarone (NEXTERONE) 1.8 mg/mL load via infusion 150 mg (150 mg Intravenous Bolus from Bag 03/25/19 0222)    Followed by  amiodarone (NEXTERONE PREMIX) 360-4.14 MG/200ML-% (1.8 mg/mL) IV infusion (60 mg/hr Intravenous New Bag/Given 03/25/19 0243)    Followed by  amiodarone (NEXTERONE PREMIX) 360-4.14 MG/200ML-% (1.8 mg/mL) IV infusion (has no administration in time range)  amiodarone (NEXTERONE PREMIX) 360-4.14 MG/200ML-% (1.8 mg/mL) IV infusion (has no administration in time range)  ceFEPIme (MAXIPIME) 2 g in sodium chloride 0.9 % 100 mL IVPB (has no administration in time range)  vancomycin (VANCOCIN) 1,500 mg in sodium chloride 0.9 % 500 mL  IVPB (has no administration in time range)  vancomycin variable dose per unstable renal function (pharmacist dosing) (has no administration in time range)  Chlorhexidine Gluconate Cloth 2 % PADS 6 each (has no administration in time range)  nitroGLYCERIN (NITROSTAT) 0.4 MG SL tablet (0.8 mg  Given 03/25/19 0220)  nitroGLYCERIN (NITROSTAT) 0.4 MG SL tablet (0.4 mg  Given 03/25/19 0215)    Mobility walks High fall risk   Focused Assessments Pulmonary Assessment Handoff:  Lung sounds: Bilateral Breath Sounds: Expiratory wheezes, Inspiratory wheezes, Fine crackles O2 Device: NRB O2 Flow Rate (L/min): 15 L/min      R Recommendations: See Admitting Provider Note  Report given to:   Additional Notes:

## 2019-03-25 NOTE — ED Notes (Signed)
Paged nephrology- Joelyn Oms to Dr Leonette Monarch

## 2019-03-25 NOTE — Consult Note (Signed)
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  Patient has chronic CHF, history of presentation for acute hypoxia and is very sensitive to volume shift.  Patient placed on nonrebreather and transiently required nitroglycerin, blood pressure now has normalized.  Portable chest x-ray reveals right greater than left airspace disease consistent with edema or infection.  SARS 2 coronavirus testing negative.  Patient is afebrile.  Patient's last HD treatment was 4/18, only 1.3 L of ultrafiltration removed and had a post weight 1.9 kg above EDW.  Treatment was full and duration but limited by significant IDH.  Patient's fluid gains are reasonable.  PMH Incudes:  Chronic systolic heart failure  HIV on ARV's  Atrial fibrillation  Outpatient HD prescription: 4 hours, F180, BFR 400, EDW 80.5 kg, 3K bath, UF profile 2   Creat (mg/dL)  Date Value  03/19/2019 7.71 (H)  12/18/2018 8.41 (H)  06/19/2018 7.44 (H)  12/17/2017 4.84 (H)  05/22/2017 3.67 (H)  12/05/2016 3.41 (H)  06/12/2016 3.27 (H)  02/16/2016 3.12 (H)  11/10/2015 2.91 (H)  06/02/2015 3.74 (H)   Creatinine, Ser (mg/dL)  Date Value  03/25/2019 9.57 (H)  03/18/2019 11.88 (H)  03/15/2019 11.95 (H)  03/14/2019 8.88 (H)  03/13/2019 6.54 (H)  03/12/2019 8.34 (H)  03/11/2019 13.00 (H)  03/09/2019 7.84 (H)  03/08/2019 6.80 (H)  03/06/2019 11.53 (H)  ]  ROS Balance of 12 systems is negative w/ exceptions as above  PMH  Past Medical History:  Diagnosis Date  . Acute on chronic systolic and diastolic heart failure, NYHA class 4 ([place])   . Anemia, iron deficiency  11/15/2011  . Arthritis    "hands, right knee, feet" (02/21/2018)  . Atrial fibrillation ([place])   . Cancer [facility])    hx of prostate; s/p radioactive seed implant 10/2009 Dr Janice Norrie  . Cardiac arrest ([place]) 02/17/2018  . CHF (congestive heart failure) ([place])   . Chronic lower back pain   . CKD (chronic kidney disease) stage V requiring chronic dialysis ([place])   . ESRD on dialysis ([place])    started 02/2018  . GERD (gastroesophageal reflux disease)   . Glaucoma   . Gout    daily RX (02/21/2018)  . Heart murmur    "mild" per pt  . Hepatitis    years ago  . History of cardiac cath 2004   negative for CAD  . History of myocardial perfusion scan 02/2010   negative for coronary insufficiency (LVEF 27%)  . HIV (human immunodeficiency virus infection) ([place])   . HIV infection ([place])   . Hyperlipidemia   . Hypertension    followed by [facility] Vascular (Dr Dani Gobble Croitoru)  . Hypertension   . Nonischemic cardiomyopathy ([place])   . Pneumonia 11/2017  . Prostate cancer ([place])   . Sinus bradycardia   . Sleep apnea    does not use a cpap  . Stroke [facility])    "mini stroke" years ago  . Systolic and diastolic CHF, acute ([place]) 06/2010   felt to be secondary to hypertensive cardiomyopathy   PSH  Past Surgical History:  Procedure Laterality Date  . AV FISTULA PLACEMENT Right 10/13/2016   Procedure: ARTERIOVENOUS (AV) FISTULA CREATION;  Surgeon: [name], MD;  Location: [place];  Service: Vascular;  Laterality: Right;  . AV FISTULA PLACEMENT Right 10/13/2016   [name] [id number]  . AV FISTULA PLACEMENT Left 02/25/2018   Procedure: INSERTION OF ARTERIOVENOUS (AV) GORE-TEX GRAFT LEFT UPPER ARM;  Surgeon: Angelia Mould, MD;  Location: [place];  Service: Vascular;  Laterality: Left;  . AV FISTULA PLACEMENT Right 08/07/2018   Procedure: Creation of right arm brachiocephalic Fistula;  Surgeon: Waynetta Sandy, MD;  Location: [place];  Service: Vascular;  Laterality: Right;  . BASCILIC VEIN  TRANSPOSITION Left 06/24/2015   Procedure: BASILIC VEIN TRANSPOSITION;  Surgeon: Mal Misty, MD;  Location: [place];  Service: Vascular;  Laterality: Left;  . BASCILIC VEIN TRANSPOSITION Left 2/54/2706 Bascilic vein transposition (Left)   [name] [id number]  . BIOPSY  03/14/2019   Procedure: BIOPSY;  Surgeon: Irene Shipper, MD;  Location: [facility] ENDOSCOPY;  Service: Endoscopy;;  . CARDIAC CATHETERIZATION  02/04/2003   mildly depressed LV systolic fx EF 17%,OHYWVP coronaries/abdominal aorta/renal arteries.  Marland Kitchen CARDIAC CATHETERIZATION  2004  . CATARACT EXTRACTION, BILATERAL Bilateral   . COLONOSCOPY    . COLONOSCOPY WITH PROPOFOL N/A 03/14/2019   Procedure: COLONOSCOPY WITH PROPOFOL;  Surgeon: Irene Shipper, MD;  Location: [facility]COPY;  Service: Endoscopy;  Laterality: N/A;  . ESOPHAGOGASTRODUODENOSCOPY (EGD) WITH PROPOFOL N/A 05/05/2014   Procedure: ESOPHAGOGASTRODUODENOSCOPY (EGD) WITH PROPOFOL;  Surgeon: Missy Sabins, MD;  Location: [facility] ENDOSCOPY;  Service: Endoscopy;  Laterality: N/A;  . EYE SURGERY Bilateral    cataract surgery   . EYE SURGERY Bilateral    glaucoma surgery  . EYE SURGERY    . GLAUCOMA SURGERY Bilateral   . INSERTION OF ARTERIOVENOUS (AV) ARTEGRAFT ARM  10/09/2018   Procedure: INSERTION OF ARTERIOVENOUS (AV) 54m x 41cm ARTEGRAFT LEFT UPPER ARM;  Surgeon: CWaynetta Sandy MD;  [place]  Service: Vascular;;  . INSERTION PROSTATE RADIATION SEED    . IR FLUORO GUIDE CV LINE RIGHT  02/18/2018  . IR REMOVAL TUN CV CATH W/O FL  12/06/2018  . IR [place] VASC ACCESS RIGHT  02/18/2018  . LEFT HEART CATH AND CORONARY ANGIOGRAPHY N/A 02/20/2018   Procedure: LEFT HEART CATH AND CORONARY ANGIOGRAPHY;  Surgeon: VJettie Booze MD;  [facility];  Service: Cardiovascular;  Laterality: N/A;  . NM MYOCAR PERF WALL MOTION  02/21/2010   normal  . RADIOACTIVE SEED IMPLANT  2010   prostate cancer  . THROMBECTOMY W/ EMBOLECTOMY Left 02/26/2018   Procedure:  THROMBECTOMY ARTERIOVENOUS GRAFT;  Surgeon: DAngelia Mould MD;  [place] [facility] [place]  Service: Vascular;  Laterality: Left;  . ULTRASOUND GUIDANCE FOR VASCULAR ACCESS  02/20/2018   Procedure: Ultrasound Guidance For Vascular Access;  Surgeon: VJettie Booze MD;  [facility];  Service: Cardiovascular;;  . UPPER EXTREMITY VENOGRAPHY N/A 07/08/2018   Procedure: UPPER EXTREMITY VENOGRAPHY;  Surgeon: CWaynetta Sandy MD;  [facility];  Service: Cardiovascular;  Laterality: N/A;  Bilateral  . [place]CHOCARDIOGRAPHY  02/06/2012   mild LVH,LA mod. dilated,mild-mod. 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 AI.   FH  Family History  Problem Relation Age of Onset  . Hypertension Mother   . Thyroid disease Mother   . Cholelithiasis Daughter   . Cholelithiasis Son   . Hypertension Maternal Grandmother   . Diabetes Maternal Grandmother   . Heart attack Neg Hx   . Hyperlipidemia Neg Hx  SH  reports that he quit smoking about 14 years ago. His smoking use included cigarettes. He has a 30.00 pack-year smoking history. He has never used smokeless tobacco. He reports current alcohol use. He reports that he does not use drugs. Allergies  Allergies  Allergen Reactions  . Cozaar [Losartan Potassium] Other (See Comments)    Causes constipation    Home medications Prior to Admission medications   Medication Sig Start Date End Date Taking? Authorizing Provider  acetaminophen (TYLENOL) 500 MG tablet Take 500-1,000 mg by mouth every 6 (six) hours as needed for mild pain.     [provider]  allopurinol (ZYLOPRIM) 100 MG tablet TAKE 1 TABLET EVERY DAY 12/23/18   Croitoru, Mihai, MD  amiodarone (PACERONE) 200 MG tablet TAKE 1 TABLET(200 MG) BY MOUTH DAILY 10/29/18   Croitoru, Mihai, MD  AURYXIA 1 GM 210 MG(Fe) tablet Take 420 mg by mouth 3 (three) times daily with meals.  08/05/18   [provider]   bictegravir-emtricitabine-tenofovir AF ([name]) 50-200-25 MG TABS tablet Take 1 tablet by mouth daily. 01/01/19   Truman Hayward, MD  brimonidine (ALPHAGAN) 0.15 % ophthalmic solution Place 1 drop into the left eye 2 (two) times daily. 11/15/17   [provider]  dorzolamide-timolol (COSOPT) 22.3-6.8 MG/ML ophthalmic solution Place 1 drop into the left eye 2 (two) times daily. 12/17/17   [provider]  latanoprost (XALATAN) 0.005 % ophthalmic solution Place 1 drop into the left eye at bedtime.     [provider]  loratadine (CLARITIN) 10 MG tablet Take 1 tablet (10 mg total) by mouth daily. 02/21/19   [name], NP  midodrine (PROAMATINE) 10 MG tablet Take 1 tablet (10 mg total) by mouth as directed. Twice a day on the day of HD Tuesday, Thursday, Saturday (1 in AM and 1 at [place]). Take 1/2 tablet (17m) a day on non HD days 03/06/19   DShearon Stalls Rahul P, PA-C  multivitamin (RENA-VIT) TABS tablet Take 1 tablet by mouth daily.    [provider]  warfarin (COUMADIN) 5 MG tablet Take 2 tablets by mouth daily or as directed by coumadin clinic Patient taking differently: Take 5 mg by mouth daily.  01/30/19   Croitoru, Mihai, MD    Current Medications Scheduled Meds: Continuous Infusions: . amiodarone 60 mg/hr (03/25/19 0243)   Followed by  . amiodarone    . amiodarone    . nitroGLYCERIN     PRN Meds:.  CBC Recent Labs  Lab 03/18/19 0817 03/19/19 1140 03/25/19 0215  WBC 9.1 10.5 24.1*  NEUTROABS  --  7,487 18.6*  HGB 9.8* 11.2* 11.3*  HCT 31.0* 32.8* 36.5*  MCV 106.9* 104.5* 110.9*  PLT 378 449* 3161  Basic Metabolic Panel Recent Labs  Lab 03/18/19 0817 03/19/19 1140 03/25/19 0148  NA 136 139 137  K 3.4* 3.8 4.2  CL 96* 96* 98  CO2 23 27 19*  GLUCOSE 95 81 202*  BUN 58* 30* 58*  CREATININE 11.88* 7.71* 9.57*  CALCIUM 9.1 9.3 9.3  PHOS 7.2*  --  6.3*    Physical Exam  Blood pressure 123/83, pulse (!) 108, temperature 98.4  F (36.9 C), temperature source Oral, resp. rate (!) 27, SpO2 100 %. GEN: Interactive, conversant, on nonrebreather ENT: NCAT EYES: EOMI CV: Tachycardic, regular, normal S1 and S2 PULM: Coarse breath sounds bilaterally ABD: Soft, nontender SKIN: No significant peripheral edema, no rashes EXT: Right upper arm AV graft with bruit and thrill  Assessment 78 year old male, ESRD, recurrent admissions for flash pulmonary edema in the setting of chronic systolic CHF.  Currently on nonrebreather, COVID-19 negative  1. Hypoxic respiratory failure on nonrebreather, flash pulmonary edema 2. ESRD on hemodialysis THS 3. Chronic systolic heart failure 4. HIV 5. Recent admission for acute colitis, ischemic in etiology partially driven by hypotension 6. Anemia 7. CKD BMD on calcitriol, [place] 8. Leukocytosis  Plan 1. HD first thing in the morning, attempt 3 L ultrafiltration, 3K, no heparin; midodrine pre HD and during; albumin 25gm prn low SBP 2. Needs 4d/wk HD, will start making arragements in AM   Pearson Grippe MD 03/25/2019, 3:37 AM

## 2019-03-25 NOTE — Significant Event (Addendum)
Rapid Response Event Note  Overview: Respiratory - Ongoing Distress   Initial Focused Assessment: Called by RN about patient's respiratory status. Per nurse, patient is experiencing ongoing and increasing shortness of breath coupled with increased work of breathing. Patient is on PRB at [place] with oxygen saturations 95-97%. Upon arrival, patient was short of breath, could only speak a few words at a time, he endorsed that his breathing feels worse. + WOB, + use of accessory muscles. Lung sounds - coarse crackles throughout, rhonchi throughout, RT worse the LT. He appears fatigued. HR 90s, BP stable. Skin warm and dry.   Interventions: - No RRT Interventions  Plan of Care: - Per notes and nurse, patient is on the list for HD today but will not go til this afternoon. I am concerned that he might get tired and might need BIPAP. I walked over the HD unit, spoke with Renal MD and HD staff, per HD staff, patient will come HD next.  - BIPAP might be needed if patient does not go to HD soon. If so, call RR RN and RT.   Event Summary:    at    Call Time 0840 Arrival Time 0842 End Time [place], [place]

## 2019-03-25 NOTE — Telephone Encounter (Signed)
Left voice message for the patient to call back

## 2019-03-25 NOTE — ED Triage Notes (Signed)
Pt with history of CHF reportts shortness of breath, Ems reports 86% o2 sats on RA, blood pressure in 179/113, 118CBG, 40RR

## 2019-03-25 NOTE — Progress Notes (Signed)
ANTICOAGULATION CONSULT NOTE - Initial Consult  Pharmacy Consult for Coumadin Indication: atrial fibrillation  Allergies  Allergen Reactions  . Cozaar [Losartan Potassium] Other (See Comments)    Causes constipation     Vital Signs: Temp: 98.4 F (36.9 C) (04/21 0146) Temp Source: Oral (04/21 0146) BP: 122/82 (04/21 0400) Pulse Rate: 108 (04/21 0323)  Labs: Recent Labs    03/25/19 0148 03/25/19 0215 03/25/19 0230  HGB  --  11.3*  --   HCT  --  36.5*  --   PLT  --  397  --   LABPROT  --   --  21.2*  INR  --   --  1.9*  CREATININE 9.57*  --   --     Estimated Creatinine Clearance: 7.4 mL/min (A) (by C-G formula based on SCr of 9.57 mg/dL (H)).   Medical History: Past Medical History:  Diagnosis Date  . Acute on chronic systolic and diastolic heart failure, NYHA class 4 ([place])   . Anemia, iron deficiency 11/15/2011  . Arthritis    "hands, right knee, feet" (02/21/2018)  . Atrial fibrillation ([place])   . Cancer [facility])    hx of prostate; s/p radioactive seed implant 10/2009 Dr Janice Norrie  . Cardiac arrest ([place]) 02/17/2018  . CHF (congestive heart failure) ([place])   . Chronic lower back pain   . CKD (chronic kidney disease) stage V requiring chronic dialysis ([place])   . ESRD on dialysis ([place])    started 02/2018  . GERD (gastroesophageal reflux disease)   . Glaucoma   . Gout    daily RX (02/21/2018)  . Heart murmur    "mild" per pt  . Hepatitis    years ago  . History of cardiac cath 2004   negative for CAD  . History of myocardial perfusion scan 02/2010   negative for coronary insufficiency (LVEF 27%)  . HIV (human immunodeficiency virus infection) ([place])   . HIV infection ([place])   . Hyperlipidemia   . Hypertension    followed by [facility] and V[facility] (Dr Dani Gobble Croitoru)  . Hypertension   . Nonischemic cardiomyopathy ([place])   . Pneumonia 11/2017  . Prostate cancer ([place])   . Sinus bradycardia   . Sleep apnea    does not use a cpap  . Stroke [facility])    "mini  stroke" years ago  . Systolic and diastolic CHF, acute ([place]) 06/2010   felt to be secondary to hypertensive cardiomyopathy    Assessment: 78yo male admitted w/ acute hypoxic respiratory failure, to continue Coumadin for Afib; current INR slightly below goal.  Goal of Therapy:  INR 2-3   Plan:  Will give boosted Coumadin dose today of 7.5mg  and monitor INR for dose adjustments.  Wynona Neat, PharmD, BCPS  03/25/2019,4:24 AM

## 2019-03-25 NOTE — H&P (Signed)
History and Physical    [facility] XYV:[id number] DOB: Sep 20, 1941 DOA: 03/25/2019  PCP: [name], NP   Patient coming from: Home   Chief Complaint: SOB   HPI: [facility] Petzold is a 78 y.o. 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  The patient reports that he went to bed in his usual state but woke with severe shortness of breath.  Reports that he completed his dialysis session on 03/22/2019 and denies any indiscretions with his diet or fluid intake.  He denies any chest pain, cough, fevers, or chills.  He denies any leg swelling or tenderness.  EMS was called out, he was found to be saturating in the mid 80s on room air with blood pressure 179/113, was started supplemental oxygen via on nonrebreather, and brought into the ED.  ED Course: Upon arrival to the ED, patient is found to be afebrile, saturating 93% on nonrebreather, tachypneic in the 40s, tachycardic in the 140s, and with stable blood pressure.  EKG features a wide-complex tachycardia with rate 145.  Chest x-ray is notable for bilateral airspace disease, right greater than left, reflecting asymmetric edema versus infection.  Chemistry panel is notable for glucose of 202, normal potassium, bicarbonate 19, and BUN 58.  CBC is notable for leukocytosis to 24,100 and a macrocytic anemia with hemoglobin 11.3.  Lactic acid is elevated to 2.7 and COVID-19 testing is negative.  Blood cultures were collected, patient was started on vancomycin and cefepime, given a dose of sublingual nitroglycerin, loaded with IV amiodarone, and started on amiodarone infusion in the ED.  Nephrology was consulted by the ED physician and is arranging for inpatient  dialysis.  Review of Systems:  All other systems reviewed and apart from HPI, are negative.  Past Medical History:  Diagnosis Date  . Acute on chronic systolic and diastolic heart failure, NYHA class 4 ([place])   . Anemia, iron deficiency 11/15/2011  . Arthritis    "hands, right knee, feet" (02/21/2018)  . Atrial fibrillation ([place])   . Cancer [facility])    hx of prostate; s/p radioactive seed implant 10/2009 Dr Janice Norrie  . Cardiac arrest ([place]) 02/17/2018  . CHF (congestive heart failure) ([place])   . Chronic lower back pain   . CKD (chronic kidney disease) stage V requiring chronic dialysis ([place])   . ESRD on dialysis ([place])    started 02/2018  . GERD (gastroesophageal reflux disease)   . Glaucoma   . Gout    daily RX (02/21/2018)  . Heart murmur    "mild" per pt  . Hepatitis    years ago  . History of cardiac cath 2004   negative for CAD  . History of myocardial perfusion scan 02/2010   negative for coronary insufficiency (LVEF 27%)  . HIV (human immunodeficiency virus infection) ([place])   . HIV infection ([place])   . Hyperlipidemia   . Hypertension    followed by [facility] (Dr Dani Gobble Croitoru)  . Hypertension   . Nonischemic cardiomyopathy ([place])   . Pneumonia 11/2017  . Prostate cancer ([place])   . Sinus bradycardia   . Sleep apnea    does not use a cpap  . Stroke [facility])    "mini stroke" years ago  . Systolic and diastolic CHF,  acute ([place]) 06/2010   felt to be secondary to hypertensive cardiomyopathy    Past Surgical History:  Procedure Laterality Date  . AV FISTULA PLACEMENT Right 10/13/2016   Procedure: ARTERIOVENOUS (AV) FISTULA CREATION;  Surgeon: [name], MD;  Location: [place];  Service: Vascular;  Laterality: Right;  . AV FISTULA PLACEMENT Right 10/13/2016   [name] [id number]  . AV FISTULA PLACEMENT Left 02/25/2018   Procedure: INSERTION OF ARTERIOVENOUS (AV) GORE-TEX GRAFT LEFT UPPER ARM;  Surgeon: Angelia Mould, MD;  Location: [place];  Service:  Vascular;  Laterality: Left;  . AV FISTULA PLACEMENT Right 08/07/2018   Procedure: Creation of right arm brachiocephalic Fistula;  Surgeon: Waynetta Sandy, MD;  Location: [place];  Service: Vascular;  Laterality: Right;  . BASCILIC VEIN TRANSPOSITION Left 06/24/2015   Procedure: BASILIC VEIN TRANSPOSITION;  Surgeon: Mal Misty, MD;  Location: [place];  Service: Vascular;  Laterality: Left;  . BASCILIC VEIN TRANSPOSITION Left 2/83/1517 Bascilic vein transposition (Left)   [name] [id number]  . BIOPSY  03/14/2019   Procedure: BIOPSY;  Surgeon: Irene Shipper, MD;  Location: [facility] ENDOSCOPY;  Service: Endoscopy;;  . CARDIAC CATHETERIZATION  02/04/2003   mildly depressed LV systolic fx EF 62%,IRSWNI coronaries/abdominal aorta/renal arteries.  Marland Kitchen CARDIAC CATHETERIZATION  2004  . CATARACT EXTRACTION, BILATERAL Bilateral   . COLONOSCOPY    . COLONOSCOPY WITH PROPOFOL N/A 03/14/2019   Procedure: COLONOSCOPY WITH PROPOFOL;  Surgeon: Irene Shipper, MD;  Location: [facility] ENDOSCOPY;  Service: Endoscopy;  Laterality: N/A;  . ESOPHAGOGASTRODUODENOSCOPY (EGD) WITH PROPOFOL N/A 05/05/2014   Procedure: ESOPHAGOGASTRODUODENOSCOPY (EGD) WITH PROPOFOL;  Surgeon: Missy Sabins, MD;  Location: [facility] ENDOSCOPY;  Service: Endoscopy;  Laterality: N/A;  . EYE SURGERY Bilateral    cataract surgery   . EYE SURGERY Bilateral    glaucoma surgery  . EYE SURGERY    . GLAUCOMA SURGERY Bilateral   . INSERTION OF ARTERIOVENOUS (AV) ARTEGRAFT ARM  10/09/2018   Procedure: INSERTION OF ARTERIOVENOUS (AV) 98mm x 41cm ARTEGRAFT LEFT UPPER ARM;  Surgeon: Waynetta Sandy, MD;  Location: [place];  Service: Vascular;;  . INSERTION PROSTATE RADIATION SEED    . IR FLUORO GUIDE CV LINE RIGHT  02/18/2018  . IR REMOVAL TUN CV CATH W/O FL  12/06/2018  . IR US GUIDE VASC ACCESS RIGHT  02/18/2018  . LEFT HEART CATH AND CORONARY ANGIOGRAPHY N/A 02/20/2018   Procedure: LEFT HEART CATH AND CORONARY ANGIOGRAPHY;  Surgeon: Jettie Booze, MD;   Location: [place] CV LAB;  Service: Cardiovascular;  Laterality: N/A;  . NM MYOCAR PERF WALL MOTION  02/21/2010   normal  . RADIOACTIVE SEED IMPLANT  2010   prostate cancer  . THROMBECTOMY W/ EMBOLECTOMY Left 02/26/2018   Procedure: THROMBECTOMY ARTERIOVENOUS GRAFT;  Surgeon: Angelia Mould, MD;  Location: [place];  Service: Vascular;  Laterality: Left;  . ULTRASOUND GUIDANCE FOR VASCULAR ACCESS  02/20/2018   Procedure: Ultrasound Guidance For Vascular Access;  Surgeon: Jettie Booze, MD;  Location: [place] CV LAB;  Service: Cardiovascular;;  . UPPER EXTREMITY VENOGRAPHY N/A 07/08/2018   Procedure: UPPER EXTREMITY VENOGRAPHY;  Surgeon: Waynetta Sandy, MD;  Location: [place] CV LAB;  Service: Cardiovascular;  Laterality: N/A;  Bilateral  . US ECHOCARDIOGRAPHY  02/06/2012   mild LVH,LA mod. dilated,mild-mod. 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 AI.     reports that he quit smoking about 14 years ago. His smoking use included cigarettes. He has  a 30.00 pack-year smoking history. He has never used smokeless tobacco. He reports current alcohol use. He reports that he does not use drugs.  Allergies  Allergen Reactions  . Cozaar [Losartan Potassium] Other (See Comments)    Causes constipation     Family History  Problem Relation Age of Onset  . Hypertension Mother   . Thyroid disease Mother   . Cholelithiasis Daughter   . Cholelithiasis Son   . Hypertension Maternal Grandmother   . Diabetes Maternal Grandmother   . Heart attack Neg Hx   . Hyperlipidemia Neg Hx      Prior to Admission medications   Medication Sig Start Date End Date Taking? Authorizing Provider  acetaminophen (TYLENOL) 500 MG tablet Take 500-1,000 mg by mouth every 6 (six) hours as needed for mild pain.     [provider]  allopurinol (ZYLOPRIM) 100 MG tablet TAKE 1 TABLET EVERY DAY 12/23/18   Croitoru, Mihai, MD  amiodarone (PACERONE) 200 MG tablet  TAKE 1 TABLET(200 MG) BY MOUTH DAILY 10/29/18   Croitoru, Mihai, MD  AURYXIA 1 GM 210 MG(Fe) tablet Take 420 mg by mouth 3 (three) times daily with meals.  08/05/18   [provider]  bictegravir-emtricitabine-tenofovir AF ([name]) 50-200-25 MG TABS tablet Take 1 tablet by mouth daily. 01/01/19   Truman Hayward, MD  brimonidine (ALPHAGAN) 0.15 % ophthalmic solution Place 1 drop into the left eye 2 (two) times daily. 11/15/17   [provider]  dorzolamide-timolol (COSOPT) 22.3-6.8 MG/ML ophthalmic solution Place 1 drop into the left eye 2 (two) times daily. 12/17/17   [provider]  latanoprost (XALATAN) 0.005 % ophthalmic solution Place 1 drop into the left eye at bedtime.     [provider]  loratadine (CLARITIN) 10 MG tablet Take 1 tablet (10 mg total) by mouth daily. 02/21/19   [name], NP  midodrine (PROAMATINE) 10 MG tablet Take 1 tablet (10 mg total) by mouth as directed. Twice a day on the day of HD Tuesday, Thursday, Saturday (1 in AM and 1 at [place]). Take 1/2 tablet (5mg ) a day on non HD days 03/06/19   Shearon Stalls, Rahul P, PA-C  multivitamin (RENA-VIT) TABS tablet Take 1 tablet by mouth daily.    [provider]  warfarin (COUMADIN) 5 MG tablet Take 2 tablets by mouth daily or as directed by coumadin clinic Patient taking differently: Take 5 mg by mouth daily.  01/30/19   Croitoru, Dani Gobble, MD    Physical Exam: Vitals:   03/25/19 0245 03/25/19 0300 03/25/19 0323 03/25/19 0400  BP: 107/75 112/76 123/83 122/82  Pulse:   (!) 108   Resp: (!) 26 (!) 27 (!) 27 (!) 24  Temp:      TempSrc:      SpO2:   100%     Constitutional: NAD, calm  Eyes: PERTLA, lids and conjunctivae normal ENMT: Mucous membranes are moist. Posterior pharynx clear of any exudate or lesions.   Neck: normal, supple, no masses, no thyromegaly Respiratory: tachypneic, dyspneic with speech. Rales bilaterally. No pallor or cyanosis.  Cardiovascular: Rate ~110 and  regular. No extremity edema.   Abdomen: No distension, no tenderness, soft. Bowel sounds active.  Musculoskeletal: no clubbing / cyanosis. No joint deformity upper and lower extremities.    Skin: no significant rashes, lesions, ulcers. Warm, dry, well-perfused. Neurologic: no gross facial asymmetry. Sensation intact. Moving all extremities.  Psychiatric: Alert and oriented x 3. Calm, cooperative.    Labs on Admission: I have  personally reviewed following labs and imaging studies  CBC: Recent Labs  Lab 03/18/19 0817 03/19/19 1140 03/25/19 0215  WBC 9.1 10.5 24.1*  NEUTROABS  --  7,487 18.6*  HGB 9.8* 11.2* 11.3*  HCT 31.0* 32.8* 36.5*  MCV 106.9* 104.5* 110.9*  PLT 378 449* 983   Basic Metabolic Panel: Recent Labs  Lab 03/18/19 0817 03/19/19 1140 03/25/19 0148  NA 136 139 137  K 3.4* 3.8 4.2  CL 96* 96* 98  CO2 23 27 19*  GLUCOSE 95 81 202*  BUN 58* 30* 58*  CREATININE 11.88* 7.71* 9.57*  CALCIUM 9.1 9.3 9.3  PHOS 7.2*  --  6.3*   GFR: Estimated Creatinine Clearance: 7.4 mL/min (A) (by C-G formula based on SCr of 9.57 mg/dL (H)). Liver Function Tests: Recent Labs  Lab 03/18/19 0817 03/19/19 1140 03/25/19 0148  AST  --  34  --   ALT  --  44  --   BILITOT  --  0.3  --   PROT  --  7.0  --   ALBUMIN 2.6*  --  3.5   No results for input(s): LIPASE, AMYLASE in the last 168 hours. No results for input(s): AMMONIA in the last 168 hours. Coagulation Profile: Recent Labs  Lab 03/25/19 0230  INR 1.9*   Cardiac Enzymes: No results for input(s): CKTOTAL, CKMB, CKMBINDEX, TROPONINI in the last 168 hours. BNP (last 3 results) No results for input(s): PROBNP in the last 8760 hours. HbA1C: No results for input(s): HGBA1C in the last 72 hours. CBG: No results for input(s): GLUCAP in the last 168 hours. Lipid Profile: Recent Labs    03/25/19 0148  TRIG 116   Thyroid Function Tests: No results for input(s): TSH, T4TOTAL, FREET4, T3FREE, THYROIDAB in the last 72  hours. Anemia Panel: Recent Labs    03/25/19 0215  FERRITIN 1,078*   Urine analysis:    Component Value Date/Time   COLORURINE YELLOW 02/17/2018 0540   APPEARANCEUR CLOUDY (A) 02/17/2018 0540   LABSPEC 1.010 02/17/2018 0540   PHURINE 5.0 02/17/2018 0540   GLUCOSEU NEGATIVE 02/17/2018 0540   HGBUR LARGE (A) 02/17/2018 0540   BILIRUBINUR NEGATIVE 02/17/2018 0540   BILIRUBINUR negative 04/16/2013 1447   KETONESUR NEGATIVE 02/17/2018 0540   PROTEINUR 100 (A) 02/17/2018 0540   UROBILINOGEN 0.2 05/11/2014 1352   NITRITE NEGATIVE 02/17/2018 0540   LEUKOCYTESUR NEGATIVE 02/17/2018 0540   Sepsis Labs: @LABRCNTIP (procalcitonin:4,lacticidven:4) ) Recent Results (from the past 240 hour(s))  SARS Coronavirus 2 [facility] order, Performed in [place] hospital lab)     Status: None   Collection Time: 03/25/19  2:03 AM  Result Value Ref Range Status   SARS Coronavirus 2 NEGATIVE NEGATIVE Final    Comment: (NOTE) If result is NEGATIVE SARS-CoV-2 target nucleic acids are NOT DETECTED. The SARS-CoV-2 RNA is generally detectable in upper and lower  respiratory specimens during the acute phase of infection. The lowest  concentration of SARS-CoV-2 viral copies this assay can detect is 250  copies / mL. A negative result does not preclude SARS-CoV-2 infection  and should not be used as the sole basis for treatment or other  patient management decisions.  A negative result may occur with  improper specimen collection / handling, submission of specimen other  than nasopharyngeal swab, presence of viral mutation(s) within the  areas targeted by this assay, and inadequate number of viral copies  (<250 copies / mL). A negative result must be combined with clinical  observations, patient history, and epidemiological  information. If result is POSITIVE SARS-CoV-2 target nucleic acids are DETECTED. The SARS-CoV-2 RNA is generally detectable in upper and lower  respiratory specimens dur ing the  acute phase of infection.  Positive  results are indicative of active infection with SARS-CoV-2.  Clinical  correlation with patient history and other diagnostic information is  necessary to determine patient infection status.  Positive results do  not rule out bacterial infection or co-infection with other viruses. If result is PRESUMPTIVE POSTIVE SARS-CoV-2 nucleic acids MAY BE PRESENT.   A presumptive positive result was obtained on the submitted specimen  and confirmed on repeat testing.  While 2019 novel coronavirus  (SARS-CoV-2) nucleic acids may be present in the submitted sample  additional confirmatory testing may be necessary for epidemiological  and / or clinical management purposes  to differentiate between  SARS-CoV-2 and other Sarbecovirus currently known to infect humans.  If clinically indicated additional testing with an alternate test  methodology [phone]) is advised. The SARS-CoV-2 RNA is generally  detectable in upper and lower respiratory sp ecimens during the acute  phase of infection. The expected result is Negative. Fact Sheet for Patients:  StrictlyIdeas.no Fact Sheet for Healthcare Providers: BankingDealers.co.za This test is not yet approved or cleared by the [place] FDA and has been authorized for detection and/or diagnosis of SARS-CoV-2 by FDA under an Emergency Use Authorization (EUA).  This EUA will remain in effect (meaning this test can be used) for the duration of the COVID-19 declaration under Section 564(b)(1) of the Act, 21 U.S.C. section 360bbb-3(b)(1), unless the authorization is terminated or revoked sooner. Performed at [facility], [place] [address] [place], [place] [address]      Radiological Exams on Admission: Dg Chest Port 1 View  Result Date: 03/25/2019 CLINICAL DATA:  Shortness of breath EXAM: PORTABLE CHEST 1 VIEW COMPARISON:  03/05/2019 FINDINGS: Cardiomegaly. Bilateral  airspace disease, right greater than left. This could reflect asymmetric edema or infection. No visible effusions or acute bony abnormality. IMPRESSION: Bilateral airspace disease, right greater than left, asymmetric edema versus infection. Electronically Signed   By: Rolm Baptise M.D.   On: 03/25/2019 02:25    EKG: Independently reviewed. Wide-complex tachycardia, rate 145.   Assessment/Plan   1. Acute hypoxic respiratory failure  - Presents with acute-onset SOB that woke him from sleep  - Saturating mid-80's on rm air with BP 180/110 initially, denies chest pain, fever/chills, or cough  - He is afebrile with marked leukocytosis, bilateral airspace disease on CXR, and negative COVID-19  - Most likely flash pulmonary edema, but with asymmetric opacities on CXR and new leukocytosis to 24k, blood cultures were collected and he was started on empiric antibiotics  - Nephrology is consulting and much appreciated, planning for inpatient HD  - SLIV, fluid-restrict, check sputum culture, check strep pnuemo and legionella antigens, continue empiric antibiotics for now    2. Paroxysmal atrial fibrillation; wide-complex tachycardia    - Pt has hx of PAF on warfarin, presents with acute-onset respiratory distress, found to be in wide-complex tachycardia on arrival with rhythm uncertain  - He was started on IV amiodarone load in ED and started on infusion; now appears to be in sinus rhythm with chronic LBBB  - CHADS-VASc is at least 60 (age x2, CHF)  - Continue warfarin, continue amiodarone   3. ESRD  - Nephrology has evaluated him in ED and planning for inpatient HD    4. Nonischemic cardiomyopathy - EF 20-25% with diffuse hypokinesis on 03/04/2019  -  His presenting complaints are suspected secondary to pulmonary edema and nephrology planning for inpatient HD    5. HIV  - CD4 count was 450 and viral load undetectable in April 2020  - Continue [name]   6. COPD  - No cough or wheezing  - Continue  albuterol prn     PPE: Mask, face shield  DVT prophylaxis: sq heparin  Code Status: Full  Family Communication: Discussed with patient  Consults called: Nephrology  Admission status: Observation     Vianne Bulls, MD [facility] Pager [phone]  If 7PM-7AM, please contact night-coverage [email or web address] Password [facility]  03/25/2019, 4:18 AM

## 2019-03-25 NOTE — ED Notes (Signed)
Nephrologist at bedside

## 2019-03-25 NOTE — Progress Notes (Signed)
Pharmacy Antibiotic Note  [facility] Cavanagh is a 78 y.o. male admitted on 03/25/2019 with sepsis.  Pharmacy has been consulted for cefepime and vancomycin dosing. Cefepime 2gm and vancomycin 1gm ordered in the ED  Plan: Continue cefepime 2gm after each HD Change initial vancomycin dose to 1500 mg then give 1gm after each HD F/u cultures and clinical course F/u HD plans     Temp (24hrs), Avg:98.4 F (36.9 C), Min:98.4 F (36.9 C), Max:98.4 F (36.9 C)  Recent Labs  Lab 03/18/19 0817 03/19/19 1140 03/25/19 0148 03/25/19 0215  WBC 9.1 10.5  --  24.1*  CREATININE 11.88* 7.71* 9.57*  --   LATICACIDVEN  --   --  2.7*  --     Estimated Creatinine Clearance: 7.4 mL/min (A) (by C-G formula based on SCr of 9.57 mg/dL (H)).    Allergies  Allergen Reactions  . Cozaar [Losartan Potassium] Other (See Comments)    Causes constipation      Thank you for allowing pharmacy to be a part of this patient's care.  Excell Seltzer Poteet 03/25/2019 3:44 AM

## 2019-03-25 NOTE — Progress Notes (Signed)
Received a call from the HD unit and gave the report, CN in the HD feels the pt is not stable to have the HD now. CN Margreta Journey is aware.

## 2019-03-25 NOTE — ED Notes (Signed)
Attempted report x 2 

## 2019-03-25 NOTE — Progress Notes (Signed)
Pharmacy Antibiotic Note  [facility] David [name] is a 78 y.o. male admitted on 03/25/2019 with sepsis.  Pharmacy has been consulted for cefepime and vancomycin dosing. Cefepime 2gm and vancomycin 1gm ordered in the ED.   Plan to undergo HD today given rapid response call for increased SOB. Will order 1 time doses to be given after HD. Nephrology notes indicate possibly doing HD 4 times a week so will continue to hold off on scheduling HD doses.   Plan: Order cefepime 2gm IV once after HD Order vancomycin 1 g IV once after HD today F/u cultures and clinical course F/u HD plans  Height: 6\' 2"  (188 cm) Weight: 182 lb 5.1 oz (82.7 kg) IBW/kg (Calculated) : 82.2  Temp (24hrs), Avg:98.6 F (37 C), Min:98.4 F (36.9 C), Max:98.7 F (37.1 C)  Recent Labs  Lab 03/19/19 1140 03/25/19 0148 03/25/19 0215 03/25/19 0318 03/25/19 0424 03/25/19 0714  WBC 10.5  --  24.1*  --  19.0*  --   CREATININE 7.71* 9.57*  --   --  10.09*  --   LATICACIDVEN  --  2.7*  --  3.4*  --  1.1    Estimated Creatinine Clearance: 7.2 mL/min (A) (by C-G formula based on SCr of 10.09 mg/dL (H)).    Allergies  Allergen Reactions  . Cozaar [Losartan Potassium] Other (See Comments)    Causes constipation    Thank you for allowing pharmacy to be a part of this patient's care.  [name], PharmD, [place] Clinical Pharmacist  Pager: [phone] Phone: [phone] 03/25/2019 10:55 AM

## 2019-03-25 NOTE — Progress Notes (Signed)
[facility] [name] is a 78 y.o. 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 Most likely flash pulmonary edema, but with asymmetric opacities on CXR .   David [name]

## 2019-03-25 NOTE — ED Provider Notes (Addendum)
David [name]  CSN: [id number] Arrival date & time: 03/25/19 0124  Chief Complaint(s) Shortness of Breath  HPI David [name] is a 78 y.o. male [place] is a 78 y.o. male with extensive past medical history listed below including HIV on antiretroviral medication with nondetectable quant, chronic systolic/diastolic heart failure, ESRD on dialysis TTS who presents to the emergency department with sudden onset shortness of breath that began 1 hour prior to arrival.  Similar to prior episodes of volume overload and pulmonary edema.  Patient reports that he has not missed any sessions of dialysis.  Last session was Saturday at which time he had a full complete session.   EMS called out and noted the patient saturations were 86% on room air, BP 179/113.  Patient denies any associated chest pain.  Denies any recent fevers or infections.  No cough or congestion.  No abdominal pain.  No nausea or vomiting.  Denies any other physical complaints.  HPI  Past Medical History Past Medical History:  Diagnosis Date  . Acute on chronic systolic and diastolic heart failure, NYHA class 4 ([place])   . Anemia, iron deficiency 11/15/2011  . Arthritis    "hands, right knee, feet" (02/21/2018)  . Atrial fibrillation ([place])   . Cancer [facility])    hx of prostate; s/p radioactive seed implant 10/2009 Dr Janice Norrie  . Cardiac arrest ([place]) 02/17/2018  . CHF (congestive heart failure) ([place])   . Chronic lower back pain   . CKD (chronic kidney disease) stage V requiring chronic dialysis ([place])   . ESRD on dialysis ([place])    started 02/2018  . GERD (gastroesophageal reflux disease)   . Glaucoma   . Gout    daily RX (02/21/2018)  . Heart murmur    "mild" per pt  . Hepatitis    years ago  . History of cardiac cath 2004   negative for CAD  . History of myocardial perfusion scan 02/2010   negative for coronary insufficiency (LVEF 27%)  . HIV (human  immunodeficiency virus infection) ([place])   . HIV infection ([place])   . Hyperlipidemia   . Hypertension    followed by [facility] (Dr Dani Gobble Croitoru)  . Hypertension   . Nonischemic cardiomyopathy ([place])   . Pneumonia 11/2017  . Prostate cancer ([place])   . Sinus bradycardia   . Sleep apnea    does not use a cpap  . Stroke [facility])    "mini stroke" years ago  . Systolic and diastolic CHF, acute ([place]) 06/2010   felt to be secondary to hypertensive cardiomyopathy   Patient Active Problem List   Diagnosis Date Noted  . Mesenteric ischemia ([place])   . Colonic ulcer   . Right lower quadrant pain   . Abnormal CT of the abdomen   . Ischemic colitis ([place])   . Colitis presumed infectious 03/09/2019  . Acute respiratory failure with hypoxia and hypercapnia ([place]) 03/04/2019  . Acute pulmonary edema ([place]) 02/11/2019  . Atrial fibrillation with RVR ([place]) 12/05/2018  . Paroxysmal atrial fibrillation ([place]) 11/16/2018  . LBBB (left bundle branch block) 11/16/2018  . Encounter for monitoring amiodarone therapy 11/16/2018  . Volume overload 11/01/2018  . Pulmonary edema 10/21/2018  . Gout 10/21/2018  . Hypertensive emergency 10/21/2018  . Chronic combined systolic and diastolic heart failure ([place]) 10/21/2018  . Leukocytosis 10/21/2018  . Gastroesophageal reflux disease 08/28/2018  . Chronic anticoagulation 04/03/2018  . CAD (coronary artery disease) 04/03/2018  .  Encounter for therapeutic drug monitoring 03/27/2018  . Atrial fibrillation, chronic 03/25/2018  . Palliative care by specialist   . Advance care planning   . Goals of care, counseling/discussion   . Cardiac arrest ([place]) 02/17/2018  . Respiratory arrest ([place])   . Acute on chronic systolic heart failure ([place])   . Chronic obstructive pulmonary disease ([place])   . HIV (human immunodeficiency virus infection) ([place])   . ESRD (end stage renal disease) ([place])   . Acute respiratory failure with hypoxia ([place]) 01/21/2018  .  Pneumonia   . Aspiration pneumonia of both lower lobes due to regurgitated food ([place])   . Sepsis due to pneumonia ([place]) 11/22/2017  . AIDS ([place]) 05/14/2014  . 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 Gastroparesis 05/06/2014  . Protein-calorie malnutrition, severe ([place]) 05/05/2014  . ESRD on hemodialysis ([place]) 05/02/2014  . Hypotension 05/02/2014  . Loss of weight 04/29/2014  . Conjunctivitis 04/29/2014  . 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 Low back pain 04/09/2012  . Osteoarthritis of hip 12/29/2011  . Iron deficiency anemia 06/28/2011  . Macrocytic anemia 04/02/2011  . 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 Hyperlipidemia 11/30/2010  . Transient cerebral ischemia 11/30/2010  . HYPERGLYCEMIA 11/30/2010   Home Medication(s) Prior to Admission medications   Medication Sig Start Date End Date Taking? Authorizing Provider  acetaminophen (TYLENOL) 500 MG tablet Take 500-1,000 mg by mouth every 6 (six) hours as needed for mild pain.     [provider]  allopurinol (ZYLOPRIM) 100 MG tablet TAKE 1 TABLET EVERY DAY 12/23/18   Croitoru, Mihai, MD  amiodarone (PACERONE) 200 MG tablet TAKE 1 TABLET(200 MG) BY MOUTH DAILY 10/29/18   Croitoru, Mihai, MD  AURYXIA 1 GM 210 MG(Fe) tablet Take 420 mg by mouth 3 (three) times daily with meals.  08/05/18   [provider]  bictegravir-emtricitabine-tenofovir AF ([name]) 50-200-25 MG TABS tablet Take 1 tablet by mouth daily. 01/01/19   Truman Hayward, MD  brimonidine (ALPHAGAN) 0.15 % ophthalmic solution Place 1 drop into the left eye 2 (two) times daily. 11/15/17   [provider]  dorzolamide-timolol (COSOPT) 22.3-6.8 MG/ML ophthalmic solution Place 1 drop into the left eye 2 (two) times daily. 12/17/17   [provider]  latanoprost (XALATAN) 0.005 % ophthalmic solution Place 1 drop into the left eye at bedtime.     [provider]  loratadine (CLARITIN) 10 MG tablet Take 1  tablet (10 mg total) by mouth daily. 02/21/19   [name], NP  midodrine (PROAMATINE) 10 MG tablet Take 1 tablet (10 mg total) by mouth as directed. Twice a day on the day of HD Tuesday, Thursday, Saturday (1 in AM and 1 at [place]). Take 1/2 tablet (5mg ) a day on non HD days 03/06/19   Shearon Stalls, Rahul P, PA-C  multivitamin (RENA-VIT) TABS tablet Take 1 tablet by mouth daily.    [provider]  warfarin (COUMADIN) 5 MG tablet Take 2 tablets by mouth daily or as directed by coumadin clinic Patient taking differently: Take 5 mg by mouth daily.  01/30/19   Croitoru, Dani Gobble, MD  Past Surgical History Past Surgical History:  Procedure Laterality Date  . AV FISTULA PLACEMENT Right 10/13/2016   Procedure: ARTERIOVENOUS (AV) FISTULA CREATION;  Surgeon: [name], MD;  Location: [place];  Service: Vascular;  Laterality: Right;  . AV FISTULA PLACEMENT Right 10/13/2016   [name] [id number]  . AV FISTULA PLACEMENT Left 02/25/2018   Procedure: INSERTION OF ARTERIOVENOUS (AV) GORE-TEX GRAFT LEFT UPPER ARM;  Surgeon: Angelia Mould, MD;  Location: [place];  Service: Vascular;  Laterality: Left;  . AV FISTULA PLACEMENT Right 08/07/2018   Procedure: Creation of right arm brachiocephalic Fistula;  Surgeon: Waynetta Sandy, MD;  Location: [place];  Service: Vascular;  Laterality: Right;  . BASCILIC VEIN TRANSPOSITION Left 06/24/2015   Procedure: BASILIC VEIN TRANSPOSITION;  Surgeon: Mal Misty, MD;  Location: [place];  Service: Vascular;  Laterality: Left;  . BASCILIC VEIN TRANSPOSITION Left 03/12/8118 Bascilic vein transposition (Left)   [name] [id number]  . BIOPSY  03/14/2019   Procedure: BIOPSY;  Surgeon: Irene Shipper, MD;  Location: [facility] ENDOSCOPY;  Service: Endoscopy;;  . CARDIAC CATHETERIZATION  02/04/2003   mildly depressed LV systolic fx EF 13%,YQMVHQ  coronaries/abdominal aorta/renal arteries.  Marland Kitchen CARDIAC CATHETERIZATION  2004  . CATARACT EXTRACTION, BILATERAL Bilateral   . COLONOSCOPY    . COLONOSCOPY WITH PROPOFOL N/A 03/14/2019   Procedure: COLONOSCOPY WITH PROPOFOL;  Surgeon: Irene Shipper, MD;  Location: [facility] ENDOSCOPY;  Service: Endoscopy;  Laterality: N/A;  . ESOPHAGOGASTRODUODENOSCOPY (EGD) WITH PROPOFOL N/A 05/05/2014   Procedure: ESOPHAGOGASTRODUODENOSCOPY (EGD) WITH PROPOFOL;  Surgeon: Missy Sabins, MD;  Location: [facility] ENDOSCOPY;  Service: Endoscopy;  Laterality: N/A;  . EYE SURGERY Bilateral    cataract surgery   . EYE SURGERY Bilateral    glaucoma surgery  . EYE SURGERY    . GLAUCOMA SURGERY Bilateral   . INSERTION OF ARTERIOVENOUS (AV) ARTEGRAFT ARM  10/09/2018   Procedure: INSERTION OF ARTERIOVENOUS (AV) 33mm x 41cm ARTEGRAFT LEFT UPPER ARM;  Surgeon: Waynetta Sandy, MD;  Location: [place];  Service: Vascular;;  . INSERTION PROSTATE RADIATION SEED    . IR FLUORO GUIDE CV LINE RIGHT  02/18/2018  . IR REMOVAL TUN CV CATH W/O FL  12/06/2018  . IR US GUIDE VASC ACCESS RIGHT  02/18/2018  . LEFT HEART CATH AND CORONARY ANGIOGRAPHY N/A 02/20/2018   Procedure: LEFT HEART CATH AND CORONARY ANGIOGRAPHY;  Surgeon: Jettie Booze, MD;  Location: [place] CV LAB;  Service: Cardiovascular;  Laterality: N/A;  . NM MYOCAR PERF WALL MOTION  02/21/2010   normal  . RADIOACTIVE SEED IMPLANT  2010   prostate cancer  . THROMBECTOMY W/ EMBOLECTOMY Left 02/26/2018   Procedure: THROMBECTOMY ARTERIOVENOUS GRAFT;  Surgeon: Angelia Mould, MD;  Location: [place];  Service: Vascular;  Laterality: Left;  . ULTRASOUND GUIDANCE FOR VASCULAR ACCESS  02/20/2018   Procedure: Ultrasound Guidance For Vascular Access;  Surgeon: Jettie Booze, MD;  Location: [place] CV LAB;  Service: Cardiovascular;;  . UPPER EXTREMITY VENOGRAPHY N/A 07/08/2018   Procedure: UPPER EXTREMITY VENOGRAPHY;  Surgeon: Waynetta Sandy, MD;  Location: [place] CV LAB;  Service: Cardiovascular;  Laterality: N/A;  Bilateral  . US ECHOCARDIOGRAPHY  02/06/2012   mild LVH,LA mod. dilated,mild-mod. 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 AI.   Family History Family History  Problem Relation Age of Onset  . Hypertension Mother   . Thyroid disease Mother   . Cholelithiasis Daughter   . Cholelithiasis Son   .  Hypertension Maternal Grandmother   . Diabetes Maternal Grandmother   . Heart attack Neg Hx   . Hyperlipidemia Neg Hx     Social History Social History   Tobacco Use  . Smoking status: Former Smoker    Packs/day: 1.00    Years: 30.00    Pack years: 30.00    Types: Cigarettes    Last attempt to quit: 2006    Years since quitting: 14.3  . Smokeless tobacco: Never Used  Substance Use Topics  . Alcohol use: Yes    Comment: once a week-wine  . Drug use: Never   Allergies Cozaar [losartan potassium]  Review of Systems Review of Systems All other systems are reviewed and are negative for acute change except as noted in the HPI  Physical Exam Vital Signs  I have reviewed the triage vital signs BP 167/104   Pulse (!) 143   Temp 98.4 F (36.9 C) (Oral)   Resp (!) 38  SpO2 100%   Physical Exam Vitals signs reviewed.  Constitutional:      General: He is in acute distress.     Appearance: He is well-developed. He is diaphoretic.  HENT:     Head: Normocephalic and atraumatic.     Nose: Nose normal.  Eyes:     General: No scleral icterus.       Right eye: No discharge.        Left eye: No discharge.     Conjunctiva/sclera: Conjunctivae normal.     Pupils: Pupils are equal, round, and reactive to light.  Neck:     Musculoskeletal: Normal range of motion and neck supple.  Cardiovascular:     Rate and Rhythm: Tachycardia present.     Heart sounds: No murmur. No friction rub. No gallop.   Pulmonary:     Effort: Tachypnea, accessory muscle usage and respiratory distress present.     Breath  sounds: No stridor. Examination of the left-upper field reveals rales. Examination of the right-middle field reveals rales. Examination of the left-middle field reveals rales. Examination of the right-lower field reveals rales. Examination of the left-lower field reveals rales. Rales (fine) present.  Abdominal:     General: There is no distension.     Palpations: Abdomen is soft.     Tenderness: There is no abdominal tenderness.  Musculoskeletal:        General: No tenderness.       Arms:  Skin:    General: Skin is warm.     Findings: No erythema or rash.  Neurological:     Mental Status: He is alert and oriented to person, place, and time.     ED Results and Treatments Labs (all labs ordered are listed, but only abnormal results are displayed) Labs Reviewed  CBC WITH DIFFERENTIAL/PLATELET - Abnormal; Notable for the following components:      Result Value   WBC 24.1 (*)    RBC 3.29 (*)    Hemoglobin 11.3 (*)    HCT 36.5 (*)    MCV 110.9 (*)    MCH 34.3 (*)    Neutro Abs 18.6 (*)    Monocytes Absolute 1.1 (*)    Abs Immature Granulocytes 0.19 (*)    All other components within normal limits  RENAL FUNCTION PANEL - Abnormal; Notable for the following components:   CO2 19 (*)    Glucose, Bld 202 (*)    BUN 58 (*)    Creatinine, Ser 9.57 (*)    Phosphorus 6.3 (*)  GFR calc non Af Amer 5 (*)    GFR calc Af Amer 5 (*)    Anion gap 20 (*)    All other components within normal limits  LACTIC ACID, PLASMA - Abnormal; Notable for the following components:   Lactic Acid, Venous 2.7 (*)    All other components within normal limits  D-DIMER, QUANTITATIVE (NOT AT [facility]) - Abnormal; Notable for the following components:   D-Dimer, Quant 2.99 (*)    All other components within normal limits  FERRITIN - Abnormal; Notable for the following components:   Ferritin 1,078 (*)    All other components within normal limits  FIBRINOGEN - Abnormal; Notable for the following components:    Fibrinogen 748 (*)    All other components within normal limits  C-REACTIVE PROTEIN - Abnormal; Notable for the following components:   CRP 5.0 (*)    All other components within normal limits  PROTIME-INR - Abnormal; Notable for the following components:   Prothrombin Time 21.2 (*)    INR 1.9 (*)    All other components within normal limits  CBC WITH DIFFERENTIAL/PLATELET - Abnormal; Notable for the following components:   WBC 19.0 (*)    RBC 3.25 (*)    Hemoglobin 11.2 (*)    HCT 36.2 (*)    MCV 111.4 (*)    MCH 34.5 (*)    All other components within normal limits  CBG MONITORING, ED - Abnormal; Notable for the following components:   Glucose-Capillary 116 (*)    All other components within normal limits  SARS CORONAVIRUS 2 (HOSPITAL ORDER, [place] LAB)  CULTURE, BLOOD (ROUTINE X 2)  CULTURE, BLOOD (ROUTINE X 2)  GRAM STAIN  EXPECTORATED SPUTUM ASSESSMENT W REFEX TO RESP CULTURE  PROCALCITONIN  LACTATE DEHYDROGENASE  TRIGLYCERIDES  URINALYSIS, ROUTINE W REFLEX MICROSCOPIC  LACTIC ACID, PLASMA  STREP PNEUMONIAE URINARY ANTIGEN  BASIC METABOLIC PANEL  LEGIONELLA PNEUMOPHILA SEROGP 1 UR AG     EKG Interpretation  Date/Time:  Tuesday March 25 2019 01:34:18 EDT Ventricular Rate:  145 PR Interval:    QRS Duration: 146 QT Interval:  348 QTC Calculation: 541 R Axis:   -58 Text Interpretation: Extreme tachycardia with wide complex, no further rhythm analysis attempted.  Left bundle branch block with tachycardia.  Baseline wander in leads V3.  Confirmed by Addison Lank [phone]) on04/21/2020 1:55:29 AM        EKG Interpretation  Date/Time:  Tuesday March 25 2019 03:50:19 EDT Ventricular Rate:  102 PR Interval:    QRS Duration: 152 QT Interval:  383 QTC Calculation: 499 R Axis:   -55 Text Interpretation:  Sinus tachycardia Probable left atrial enlargement Left bundle branch block Missing lead(s): V6 When compared with ECG of EARLIER SAME DATE  HEART RATE has decreased Reconfirmed by Addison Lank [phone]) on 03/25/2019 5:15:50 AM          Radiology Dg Chest Port 1 View  Result Date: 03/25/2019 CLINICAL DATA:  Shortness of breath EXAM: PORTABLE CHEST 1 VIEW COMPARISON:  03/05/2019 FINDINGS: Cardiomegaly. Bilateral airspace disease, right greater than left. This could reflect asymmetric edema or infection. No visible effusions or acute bony abnormality. IMPRESSION: Bilateral airspace disease, right greater than left, asymmetric edema versus infection. Electronically Signed   By: Rolm Baptise M.D.   On: 03/25/2019 02:25   Pertinent labs & imaging results that were available during my care of the patient were reviewed by me and considered in my medical decision making (see  chart for details).  Medications Ordered in ED Medications  nitroGLYCERIN 0.2 mg/mL in dextrose 5 % infusion (has no administration in time range)  amiodarone (NEXTERONE) 1.8 mg/mL load via infusion 150 mg (150 mg Intravenous Bolus from Bag 03/25/19 0222)    Followed by  amiodarone (NEXTERONE PREMIX) 360-4.14 MG/200ML-% (1.8 mg/mL) IV infusion (60 mg/hr Intravenous New Bag/Given 03/25/19 0243)    Followed by  amiodarone (NEXTERONE PREMIX) 360-4.14 MG/200ML-% (1.8 mg/mL) IV infusion (has no administration in time range)  amiodarone (NEXTERONE PREMIX) 360-4.14 MG/200ML-% (1.8 mg/mL) IV infusion (has no administration in time range)  vancomycin (VANCOCIN) 1,500 mg in sodium chloride 0.9 % 500 mL IVPB (1,500 mg Intravenous New Bag/Given 03/25/19 0500)  vancomycin variable dose per unstable renal function (pharmacist dosing) (has no administration in time range)  Chlorhexidine Gluconate Cloth 2 % PADS 6 each (has no administration in time range)  bictegravir-emtricitabine-tenofovir AF ([name]) 50-200-25 MG per tablet 1 tablet (has no administration in time range)  insulin aspart (novoLOG) injection 0-9 Units (0 Units Subcutaneous Not Given 03/25/19 0458)  heparin  injection 5,000 Units (has no administration in time range)  sodium chloride flush (NS) 0.9 % injection 3 mL (has no administration in time range)  sodium chloride flush (NS) 0.9 % injection 3 mL (has no administration in time range)  sodium chloride flush (NS) 0.9 % injection 3 mL (has no administration in time range)  0.9 %  sodium chloride infusion (has no administration in time range)  acetaminophen (TYLENOL) tablet 650 mg (has no administration in time range)    Or  acetaminophen (TYLENOL) suppository 650 mg (has no administration in time range)  ondansetron (ZOFRAN) tablet 4 mg (has no administration in time range)    Or  ondansetron (ZOFRAN) injection 4 mg (has no administration in time range)  albuterol (PROVENTIL) (2.5 MG/3ML) 0.083% nebulizer solution 3 mL (has no administration in time range)  Warfarin - Pharmacist Dosing Inpatient (has no administration in time range)  warfarin (COUMADIN) tablet 7.5 mg (has no administration in time range)  nitroGLYCERIN (NITROSTAT) 0.4 MG SL tablet (0.8 mg  Given 03/25/19 0220)  nitroGLYCERIN (NITROSTAT) 0.4 MG SL tablet (0.4 mg  Given 03/25/19 0215)  ceFEPIme (MAXIPIME) 2 g in sodium chloride 0.9 % 100 mL IVPB (0 g Intravenous Stopped 03/25/19 0458)                                                                                                                                    Procedures Ultrasound ED Peripheral IV (Provider) Date/Time: 03/25/2019 5:07 AM Performed by: Fatima Blank, MD Authorized by: Fatima Blank, MD   Procedure details:    Indications: multiple failed IV attempts and poor IV access     Skin Prep: chlorhexidine gluconate     Location:  Left AC   Angiocath:  20 G   Bedside Ultrasound Guided: Yes     Images: not archived  Patient tolerated procedure without complications: Yes     Dressing applied: Yes   .Critical Care Performed by: Fatima Blank, MD Authorized by: Fatima Blank, MD    Critical care provider statement:    Critical care time (minutes):  60   Critical care was necessary to treat or prevent imminent or life-threatening deterioration of the following conditions:  Cardiac failure, respiratory failure and sepsis   Critical care was time spent personally by me on the following activities:  Discussions with consultants, evaluation of patient's response to treatment, examination of patient, ordering and performing treatments and interventions, ordering and review of laboratory studies, ordering and review of radiographic studies, pulse oximetry, re-evaluation of patient's condition, obtaining history from patient or surrogate and review of old charts    (including critical care time)  Medical Decision Making / ED Course I have reviewed the nursing notes for this encounter and the patient's prior records (if available in EHR or on provided paperwork).    Patient presents in respiratory distress with diffuse rales throughout consistent with likely flash pulmonary edema from volume overload.  Patient required nonrebreather.  Noted to be hypertensive and tachycardic with rates in the 140s (possible sinuse tach vs A. fib or flutter RVR).  Patient denied any infectious symptoms.  Patient given 3 sublingual nitroglycerin which improved the patient's blood pressure and assisted with fluid shift resulting in improved respiratory status.  Chest x-ray noted for bilateral opacities, right greater than left.  Most likely edema though infection was unable to be ruled out.  Labs notable for leukocytosis and elevated lactic acid.  Given the fact the patient was recently admitted, code sepsis was initiated and patient was treated empirically for possible H CAP.  Coronavirus screening was performed and patient was negative.  Nephrology was consulted for evaluation of possible need for emergent dialysis.  Patient was then amnio bolus and started on drip resulting in improved heart  rate.  Patient admitted to medicine for continued work-up and management.  Final Clinical Impression(s) / ED Diagnoses Final diagnoses:  SOB (shortness of breath)  Acute on chronic combined systolic and diastolic congestive heart failure (HCC)  Respiratory distress      This chart was dictated using voice recognition software.  Despite best efforts to proofread,  errors can occur which can change the documentation meaning.       Fatima Blank, MD 03/25/19 [phone]

## 2019-03-25 NOTE — Progress Notes (Addendum)
Patient had vomiting episode while eating dinner.  He stated he doesn't like the food.  Dr Karleen Hampshire notified, no new orders recieved.  Will ask nightshift RN to give [name] and Coumadin tonight.

## 2019-03-25 NOTE — Procedures (Signed)
I was present at this dialysis session. I have reviewed the session itself and made appropriate changes.   Vital signs in last 24 hours:  Temp:  [98.2 F (36.8 C)-98.7 F (37.1 C)] 98.7 F (37.1 C) (04/21 1150) Pulse Rate:  [87-143] 89 (04/21 1500) Resp:  [19-47] 26 (04/21 1150) BP: (94-167)/(53-104) 95/53 (04/21 1500) SpO2:  [93 %-100 %] 96 % (04/21 1230) Weight:  [82.7 kg-83.2 kg] 83.2 kg (04/21 1150) Weight change:  Filed Weights   03/25/19 0528 03/25/19 1150  Weight: 82.7 kg 83.2 kg    Recent Labs  Lab 03/25/19 0148 03/25/19 0424  NA 137 138  K 4.2 4.7  CL 98 96*  CO2 19* 21*  GLUCOSE 202* 160*  BUN 58* 59*  CREATININE 9.57* 10.09*  CALCIUM 9.3 9.4  PHOS 6.3*  --     Recent Labs  Lab 03/19/19 1140 03/25/19 0215 03/25/19 0424  WBC 10.5 24.1* 19.0*  NEUTROABS 7,487 18.6* 17.0*  HGB 11.2* 11.3* 11.2*  HCT 32.8* 36.5* 36.2*  MCV 104.5* 110.9* 111.4*  PLT 449* 397 310    Scheduled Meds: . bictegravir-emtricitabine-tenofovir AF  1 tablet Oral Daily  . Chlorhexidine Gluconate Cloth  6 each Topical Q0600  . heparin  5,000 Units Subcutaneous Q8H  . insulin aspart  0-9 Units Subcutaneous Q4H  . sodium chloride flush  3 mL Intravenous Q12H  . sodium chloride flush  3 mL Intravenous Q12H  . vancomycin variable dose per unstable renal function (pharmacist dosing)   Does not apply See admin instructions  . warfarin  7.5 mg Oral ONCE-1800  . Warfarin - Pharmacist Dosing Inpatient   Does not apply q1800   Continuous Infusions: . sodium chloride    . sodium chloride    . sodium chloride    . amiodarone 30 mg/hr (03/25/19 0833)  . ceFEPime (MAXIPIME) IV    . vancomycin     PRN Meds:.sodium chloride, sodium chloride, sodium chloride, acetaminophen **OR** acetaminophen, albuterol, alteplase, heparin, lidocaine (PF), lidocaine-prilocaine, ondansetron **OR** ondansetron (ZOFRAN) IV, pentafluoroprop-tetrafluoroeth, sodium chloride flush    Assessment/Plan: 1. Hypoxic  respiratory distress- due to volume overload and intolerance to UF with HD.  He will need serial HD while he remains an inpatient and 4 days/week HD as an outpatient.  [name],  MD 03/25/2019, 3:14 PM

## 2019-03-25 NOTE — Progress Notes (Signed)
CRITICAL VALUE ALERT  Critical Value:  Lactic acid 3.4  Date & Time Notied:  03/25/2019 0609  Provider Notified: Lamar Blinks  Orders Received/Actions taken: per protocol, awaiting

## 2019-03-26 ENCOUNTER — Inpatient Hospital Stay (HOSPITAL_COMMUNITY): Payer: Medicare HMO

## 2019-03-26 ENCOUNTER — Telehealth: Payer: Self-pay | Admitting: Cardiovascular Disease

## 2019-03-26 ENCOUNTER — Encounter: Payer: Medicare HMO | Admitting: Infectious Disease

## 2019-03-26 LAB — CBC WITH DIFFERENTIAL/PLATELET
Abs Immature Granulocytes: 0.02 10*3/uL (ref 0.00–0.07)
Basophils Absolute: 0.1 10*3/uL (ref 0.0–0.1)
Basophils Relative: 1 %
Eosinophils Absolute: 0.1 10*3/uL (ref 0.0–0.5)
Eosinophils Relative: 2 %
HCT: 31.2 % — ABNORMAL LOW (ref 39.0–52.0)
Hemoglobin: 10 g/dL — ABNORMAL LOW (ref 13.0–17.0)
Immature Granulocytes: 0 %
Lymphocytes Relative: 18 %
Lymphs Abs: 1.5 10*3/uL (ref 0.7–4.0)
MCH: 34 pg (ref 26.0–34.0)
MCHC: 32.1 g/dL (ref 30.0–36.0)
MCV: 106.1 fL — ABNORMAL HIGH (ref 80.0–100.0)
Monocytes Absolute: 0.8 10*3/uL (ref 0.1–1.0)
Monocytes Relative: 10 %
Neutro Abs: 5.7 10*3/uL (ref 1.7–7.7)
Neutrophils Relative %: 69 %
Platelets: 273 10*3/uL (ref 150–400)
RBC: 2.94 MIL/uL — ABNORMAL LOW (ref 4.22–5.81)
RDW: 14.5 % (ref 11.5–15.5)
WBC: 8.3 10*3/uL (ref 4.0–10.5)
nRBC: 0 % (ref 0.0–0.2)

## 2019-03-26 LAB — BASIC METABOLIC PANEL
Anion gap: 15 (ref 5–15)
BUN: 30 mg/dL — ABNORMAL HIGH (ref 8–23)
CO2: 26 mmol/L (ref 22–32)
Calcium: 9 mg/dL (ref 8.9–10.3)
Chloride: 96 mmol/L — ABNORMAL LOW (ref 98–111)
Creatinine, Ser: 6.78 mg/dL — ABNORMAL HIGH (ref 0.61–1.24)
GFR calc Af Amer: 8 mL/min — ABNORMAL LOW (ref >60)
GFR calc non Af Amer: 7 mL/min — ABNORMAL LOW (ref >60)
Glucose, Bld: 99 mg/dL (ref 70–99)
Potassium: 4.5 mmol/L (ref 3.5–5.1)
Sodium: 137 mmol/L (ref 135–145)

## 2019-03-26 LAB — PROTIME-INR
INR: 2.3 — ABNORMAL HIGH (ref 0.8–1.2)
Prothrombin Time: 24.7 seconds — ABNORMAL HIGH (ref 11.4–15.2)

## 2019-03-26 LAB — GLUCOSE, CAPILLARY
Glucose-Capillary: 118 mg/dL — ABNORMAL HIGH (ref 70–99)
Glucose-Capillary: 88 mg/dL (ref 70–99)
Glucose-Capillary: 104 mg/dL — ABNORMAL HIGH (ref 70–99)
Glucose-Capillary: 170 mg/dL — ABNORMAL HIGH (ref 70–99)
Glucose-Capillary: 130 mg/dL — ABNORMAL HIGH (ref 70–99)

## 2019-03-26 MED ORDER — CALCITRIOL 0.5 MCG PO CAPS
ORAL_CAPSULE | ORAL | Status: AC
  Filled 2019-03-26: qty 1

## 2019-03-26 MED ORDER — WARFARIN SODIUM 5 MG PO TABS: 5.0000 mg | ORAL_TABLET | Freq: Once | ORAL | Status: DC

## 2019-03-26 MED ORDER — AMIODARONE HCL 200 MG PO TABS
200.0000 mg | ORAL_TABLET | Freq: Every day | ORAL | Status: DC
  Administered 2019-03-26: 17:00:00 200 mg via ORAL
  Filled 2019-03-26: qty 1

## 2019-03-26 MED ORDER — CALCITRIOL 0.5 MCG PO CAPS
0.5000 ug | ORAL_CAPSULE | ORAL | Status: DC
  Administered 2019-03-26: 0.5 ug via ORAL

## 2019-03-26 MED ORDER — CALCITRIOL 0.5 MCG PO CAPS: 0.5000 ug | ORAL_CAPSULE | ORAL | Status: DC

## 2019-03-26 NOTE — Progress Notes (Addendum)
[facility] Progress Note   Subjective:   Patient seen in room. Sitting in chair, eating breakfast. Reports intermittent nausea with eating and says he does not like the food. Denies abdominal pain, diarrhea, melena. Denies any SOB, dyspnea, cough, wheezing, chills, CP or edema. O2 sat currently 99% on room air.   Objective Vitals:   03/25/19 1949 03/26/19 0045 03/26/19 0457 03/26/19 0808  BP: 110/65 104/64 108/69 99/62  Pulse:  86 (!) 101 84  Resp: (!) 27 (!) 24 20 16   Temp: 97.7 F (36.5 C)  97.6 F (36.4 C) 98.3 F (36.8 C)  TempSrc: Oral  Oral Oral  SpO2:  100% 97% 95%  Weight:   80.1 kg   Height:       Physical Exam General: Well developed male, alert, in NAD Heart: RRR, no murmurs, rubs or gallops Lungs: CTA bilaterally without wheezing, rhonchi or rales Abdomen: Soft, non-distended, non-tender. Normoactive BS. Moderate amount of emesis noted in bag.  Extremities: No edema b/l lower extremities Dialysis Access: AVG + thrill and bruit   Additional Objective Labs: Basic Metabolic Panel: Recent Labs  Lab 03/25/19 0148 03/25/19 0424 03/26/19 0338  NA 137 138 137  K 4.2 4.7 4.5  CL 98 96* 96*  CO2 19* 21* 26  GLUCOSE 202* 160* 99  BUN 58* 59* 30*  CREATININE 9.57* 10.09* 6.78*  CALCIUM 9.3 9.4 9.0  PHOS 6.3*  --   --    Liver Function Tests: Recent Labs  Lab 03/19/19 1140 03/25/19 0148  AST 34  --   ALT 44  --   BILITOT 0.3  --   PROT 7.0  --   ALBUMIN  --  3.5   No results for input(s): LIPASE, AMYLASE in the last 168 hours. CBC: Recent Labs  Lab 03/19/19 1140 03/25/19 0215 03/25/19 0424 03/26/19 0338  WBC 10.5 24.1* 19.0* 8.3  NEUTROABS 7,487 18.6* 17.0* 5.7  HGB 11.2* 11.3* 11.2* 10.0*  HCT 32.8* 36.5* 36.2* 31.2*  MCV 104.5* 110.9* 111.4* 106.1*  PLT 449* 397 310 273   Blood Culture    Component Value Date/Time   SDES BLOOD LEFT FOREARM 02/11/2019 0030   SPECREQUEST  02/11/2019 0030    BOTTLES DRAWN AEROBIC AND  ANAEROBIC Blood Culture results may not be optimal due to an inadequate volume of blood received in culture bottles   CULT  02/11/2019 0030    NO GROWTH 5 DAYS Performed at [facility], [place] [address] [place], [place] [address]    REPTSTATUS 02/16/2019 FINAL 02/11/2019 0030    Cardiac Enzymes: No results for input(s): CKTOTAL, CKMB, CKMBINDEX, TROPONINI in the last 168 hours. CBG: Recent Labs  Lab 03/25/19 1646 03/25/19 2000 03/26/19 0044 03/26/19 0455 03/26/19 0817  GLUCAP 88 110* 118* 88 104*   Iron Studies:  Recent Labs    03/25/19 0215  FERRITIN 1,078*   @[email or web address] @ Studies/Results: Dg Chest Port 1 View  Result Date: 03/25/2019 CLINICAL DATA:  Shortness of breath EXAM: PORTABLE CHEST 1 VIEW COMPARISON:  03/05/2019 FINDINGS: Cardiomegaly. Bilateral airspace disease, right greater than left. This could reflect asymmetric edema or infection. No visible effusions or acute bony abnormality. IMPRESSION: Bilateral airspace disease, right greater than left, asymmetric edema versus infection. Electronically Signed   By: Rolm Baptise M.D.   On: 03/25/2019 02:25   Medications: . sodium chloride    . amiodarone 30 mg/hr (03/26/19 0227)   . allopurinol  100 mg Oral Daily  . bictegravir-emtricitabine-tenofovir AF  1  tablet Oral Daily  . Chlorhexidine Gluconate Cloth  6 each Topical Q0600  . ferric citrate  420 mg Oral TID WC  . heparin  5,000 Units Subcutaneous Q8H  . insulin aspart  0-9 Units Subcutaneous Q4H  . sodium chloride flush  3 mL Intravenous Q12H  . sodium chloride flush  3 mL Intravenous Q12H  . vancomycin variable dose per unstable renal function (pharmacist dosing)   Does not apply See admin instructions  . Warfarin - Pharmacist Dosing Inpatient   Does not apply q1800    Dialysis Orders: [facility] on TTS EDW 80.5kg Time: 4 hours; 180 NRe Optiflux; BFR 400; DFR Autoflow 1.5; 3K/2Ca UFR profile 2, sodium modeling: none Heparin:  None Mircera 43mcg IV q2 weeks (last given 02/26/2019) Calcitriol 0.15mcg PO 3x week during HD Auryxia 2 tabs PO TID  Assessment/Plan: 1. Hypoxic respiratory failure, flash pulmonary edema: Treated empirically for possible HCAP in ED.  Symptoms improved with dialysis. Currently in no respiratory distress. Had HD yesterday with net UF 2170 mL. Planned for HD again today and will require dialysis 4 days per week as an outpatient. 2. ESRD: Normal schedule TTS, planned for HD again today for volume management. K+ 4.5. Will need dialysis 4 days per week at discharge due to sensitivity to volume shift.  3. HTN/volume:  BP soft/stable this AM. Lungs CTA, no peripheral edema. HD today with UF goal 2L. 4. Anemia: Hgb 10.0, has been in the 11's. Follow, may require restarting ESA.  5. Secondary hyperparathyroidism:  Calcium 9.0, corrected 9.4. Continue binder, will resume calcitriol. 6. Nutrition:  Albumin 3.5. Continue renal diet and fluid restrictions. 7. Recent hx of acute colitis: Ischemic in etiology. Attempt to avoid hypotension.    Anice Paganini, PA-C 03/26/2019, 9:33 AM  [facility] Pager: [phone]  I have seen and examined this patient and agree with plan and assessment in the above note with renal recommendations/intervention highlighted.  Markedly improved with extra HD.  He will have 4 days per week of HD after discharge which has been arranged by Dr. Joelyn Oms.  STable for discharge to home. [name] Quamesha Mullet,MD 03/26/2019 2:53 PM

## 2019-03-26 NOTE — Telephone Encounter (Signed)
Called to pre reg pt for 03/27/19 appt. Patient said he was in hospital. Admitted on  03/25/19,

## 2019-03-26 NOTE — Progress Notes (Signed)
[name] for Coumadin Indication: atrial fibrillation  Allergies  Allergen Reactions  . Cozaar [Losartan Potassium] Other (See Comments)    Causes constipation     Vital Signs: Temp: 98.3 F (36.8 C) (04/22 0808) Temp Source: Oral (04/22 0808) BP: 99/62 (04/22 0808) Pulse Rate: 84 (04/22 0808)  Labs: Recent Labs    03/25/19 0148  03/25/19 0215 03/25/19 0230 03/25/19 0424 03/26/19 0338  HGB  --    < > 11.3*  --  11.2* 10.0*  HCT  --   --  36.5*  --  36.2* 31.2*  PLT  --   --  397  --  310 273  LABPROT  --   --   --  21.2*  --  24.7*  INR  --   --   --  1.9*  --  2.3*  CREATININE 9.57*  --   --   --  10.09* 6.78*   < > = values in this interval not displayed.    Estimated Creatinine Clearance: 10.5 mL/min (A) (by C-G formula based on SCr of 6.78 mg/dL (H)).   Medical History: Past Medical History:  Diagnosis Date  . Acute on chronic systolic and diastolic heart failure, NYHA class 4 ([place])   . Anemia, iron deficiency 11/15/2011  . Arthritis    "hands, right knee, feet" (02/21/2018)  . Atrial fibrillation ([place])   . Cancer [facility])    hx of prostate; s/p radioactive seed implant 10/2009 Dr Janice Norrie  . Cardiac arrest ([place]) 02/17/2018  . CHF (congestive heart failure) ([place])   . Chronic lower back pain   . CKD (chronic kidney disease) stage V requiring chronic dialysis ([place])   . ESRD on dialysis ([place])    started 02/2018  . GERD (gastroesophageal reflux disease)   . Glaucoma   . Gout    daily RX (02/21/2018)  . Heart murmur    "mild" per pt  . Hepatitis    years ago  . History of cardiac cath 2004   negative for CAD  . History of myocardial perfusion scan 02/2010   negative for coronary insufficiency (LVEF 27%)  . HIV (human immunodeficiency virus infection) ([place])   . HIV infection ([place])   . Hyperlipidemia   . Hypertension    followed by [facility] (Dr Dani Gobble Croitoru)  . Hypertension   . Nonischemic  cardiomyopathy ([place])   . Pneumonia 11/2017  . Prostate cancer ([place])   . Sinus bradycardia   . Sleep apnea    does not use a cpap  . Stroke [facility]    "mini stroke" years ago  . 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   INR 1.9>2.3 (received bolus dose yesterday of 7.5 mg to get into goal range). Hgb 10, plt 273. No s/sx of bleeding. Oral intake documented at 30%.   Goal of Therapy:  INR 2-3   Plan:  Will order warfarin 5 mg tonight Monitor daily INR, CBC, and for s/sx of bleeding  [name], PharmD, [place] Clinical Pharmacist  Pager: [phone] Phone: [phone] 03/26/2019,10:50 AM

## 2019-03-26 NOTE — Discharge Summary (Signed)
Physician Discharge Summary  [facility] RXV:[id number] DOB: [birth year] DOA: 03/25/2019  PCP: [name], NP  Admit date: 03/25/2019 Discharge date: 03/26/2019  Admitted From:[facility].  Disposition:  Home.   Recommendations for Outpatient Follow-up:  1. Follow up with PCP in 1-2 weeks 2. Please obtain BMP/CBC in one week Please follow up WITH nephrology as recommended.  Please continue with 4 days of HD on discharge.    Discharge Condition: stable.  CODE STATUS:full code.  Diet recommendation: Heart Healthy renal diet.     Brief/Interim Summary: David [name] is a 78 y.o. 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 He was admitted for acute respiratory failure sec to fluid overload.   Discharge Diagnoses:  Principal Problem:   Acute respiratory failure with hypoxia (HCC) Active Problems:   Nonischemic dilated cardiomyopathy (HCC)   ESRD on hemodialysis (HCC)   Chronic obstructive pulmonary disease (HCC)   HIV (human immunodeficiency virus infection) ([place])   Pulmonary edema   Atrial fibrillation with RVR ([place])  1. Acute hypoxic respiratory failure  - Presents with acute-onset SOB that woke him from sleep  - He is afebrile with marked leukocytosis, bilateral airspace disease on CXR, and negative COVID-19  - Most likely flash pulmonary edema, but with asymmetric opacities on CXR . - Nephrology consulted and underwent inpatient HD for 2 sessions and he is on RA and his wbc count is wnl.    2. Paroxysmal atrial fibrillation; wide-complex tachycardia    - Pt has hx of PAF on warfarin, presents with acute-onset respiratory distress, found to be in wide-complex tachycardia on arrival  with rhythm uncertain  - He was started on IV amiodarone load in ED and started on infusion; now appears to be in sinus rhythm with chronic LBBB , transitioned to oral amiodarone on discharge.  - CHADS-VASc is at least 23 (age x2, CHF)  - Continue warfarin, continue amiodarone  Therapeutic INR on discharge.   3. ESRD  - Nephrology consulted and he underwent 2 inpatient HD    4. Nonischemic cardiomyopathy - EF 20-25% with diffuse hypokinesis on 03/04/2019  - His presenting complaints are suspected secondary to pulmonary edema and nephrology consulted. He underwent 2 HD treatments and he is back to baseline.   5. HIV  - CD4 count was 450 and viral load undetectable in April 2020  - Continue [name]   6. COPD  - No cough or wheezing  - Continue albuterol prn     Discharge Instructions  Discharge Instructions    Diet - low sodium heart healthy   Complete by:  As directed    Discharge instructions   Complete by:  As directed    Please follow up with PCP in one week, and make sure to keep appt for HD atleast 4 times a week.     Allergies as of 03/26/2019      Reactions   Cozaar [losartan Potassium] Other (See Comments)   Causes constipation      Medication List    STOP taking these medications   CORICIDIN HBP COUGH/COLD PO     TAKE these medications   acetaminophen 500 MG tablet Commonly known as:  TYLENOL Take 500-1,000 mg by mouth every 6 (six) hours as needed for mild pain.  allopurinol 100 MG tablet Commonly known as:  ZYLOPRIM TAKE 1 TABLET EVERY DAY   amiodarone 200 MG tablet Commonly known as:  PACERONE TAKE 1 TABLET(200 MG) BY MOUTH DAILY What changed:  See the new instructions.   Auryxia 1 GM 210 MG(Fe) tablet Generic drug:  ferric citrate Take 420 mg by mouth 3 (three) times daily with meals.   bictegravir-emtricitabine-tenofovir AF 50-200-25 MG Tabs tablet Commonly known as:  [name] Take 1 tablet by mouth daily.   brimonidine 0.15 %  ophthalmic solution Commonly known as:  ALPHAGAN Place 1 drop into the left eye 2 (two) times daily.   calcitRIOL 0.5 MCG capsule Commonly known as:  ROCALTROL Take 1 capsule (0.5 mcg total) by mouth 3 (three) times a week. Start taking on:  March 27, 2019   dorzolamide-timolol 22.3-6.8 MG/ML ophthalmic solution Commonly known as:  COSOPT Place 1 drop into the left eye 2 (two) times daily.   latanoprost 0.005 % ophthalmic solution Commonly known as:  XALATAN Place 1 drop into the left eye at bedtime.   loratadine 10 MG tablet Commonly known as:  CLARITIN Take 1 tablet (10 mg total) by mouth daily.   midodrine 10 MG tablet Commonly known as:  PROAMATINE Take 1 tablet (10 mg total) by mouth as directed. Twice a day on the day of HD Tuesday, Thursday, Saturday (1 in AM and 1 at [place]). Take 1/2 tablet (5mg ) a day on non HD days   multivitamin Tabs tablet Take 1 tablet by mouth daily.   warfarin 5 MG tablet Commonly known as:  COUMADIN Take as directed. If you are unsure how to take this medication, talk to your nurse or doctor. Original instructions:  Take 2 tablets by mouth daily or as directed by coumadin clinic What changed:    how much to take  how to take this  when to take this  additional instructions      Follow-up Information    [name], NP. Schedule an appointment as soon as possible for a visit in 1 week(s).   Specialty:  Internal Medicine Contact information: [place] [address] [phone]        Tommy Medal, Lavell Islam, MD .   Specialty:  Infectious Diseases Contact information: [address] [place] [address] [phone]        [name], MD .   Specialty:  Cardiology Contact information: [address] [place] [address] [phone]          Allergies  Allergen Reactions  . Cozaar [Losartan Potassium] Other (See Comments)    Causes constipation      Consultations:  Nephrology.    Procedures/Studies: Ct Abdomen Pelvis Wo Contrast  Result Date: 03/09/2019 CLINICAL DATA:  Right lower quadrant pain. EXAM: CT ABDOMEN AND PELVIS WITHOUT CONTRAST TECHNIQUE: Multidetector CT imaging of the abdomen and pelvis was performed following the standard protocol without IV contrast. COMPARISON:  None. FINDINGS: Lower chest: Cardiomegaly. The lower chest and lungs are otherwise normal. Hepatobiliary: No focal liver abnormality is seen. No gallstones, gallbladder wall thickening, or biliary dilatation. Pancreas: Unremarkable. No pancreatic ductal dilatation or surrounding inflammatory changes. Spleen: Normal in size without focal abnormality. Adrenals/Urinary Tract: The adrenal glands are normal. The kidneys are atrophic without hydronephrosis or perinephric stranding. No stones are identified. Mass is associated with both kidneys are favored to represent hyperdense cysts. A few lower density cysts are noted. The ureters are normal. The bladder is  decompressed but unremarkable. Stomach/Bowel: The stomach and small bowel are normal. Majority of the colon is normal. The cecum appears thick walled as seen on coronal images 51 and 56 as well as axial image 51. There is mild increased attenuation in the fat adjacent to the cecum which is relatively subtle. The appendix is well seen and normal. No evidence of appendicitis. Vascular/Lymphatic: Atherosclerotic changes are seen in the nonaneurysmal aorta. No adenopathy noted. Reproductive: Brachytherapy seeds are seen in the prostate. Other: No other acute abnormalities identified. Musculoskeletal: Severe degenerative changes in the left hip. Degenerative changes in the visualized lumbar spine. IMPRESSION: 1. Evaluation of the colon is limited due to the lack of oral contrast. However, I believe the cecum is thick walled which could be inflammatory or infectious. A neoplastic process is not excluded on today's imaging.  Recommend clinical correlation. If the patient has not had a recent colonoscopy, recommend consultation with a gastroenterologist with consideration of a colonoscopy. 2. The appendix is normal in appearance with no appendicitis. 3. Atrophic kidneys with hyperdense cysts. 4. Atherosclerotic changes in the nonaneurysmal aorta. 5. Degenerative changes in the lumbar spine and left hip. Electronically Signed   By: Dorise Bullion III M.D   On: 03/09/2019 00:39   US Pelvis Limited (transabdominal Only)  Result Date: 03/17/2019 CLINICAL DATA:  78 year old male with abnormal density in the RIGHT inguinal canal on recent CT. EXAM: SCROTAL ULTRASOUND LIMITED PELVIC ULTRASOUND TECHNIQUE: Complete ultrasound examination of the testicles, epididymis, and other scrotal structures was performed. Color and spectral Doppler ultrasound were also utilized to evaluate blood flow to the testicles. COMPARISON:  03/15/2019 and 03/09/2019 CTs FINDINGS: Right testicle Measurements: 3.4 x 1.9 x 2.4 cm. No mass identified. A peripheral calcification without adjacent mass noted. Left testicle Measurements: 3.7 x 1.7 x 2.4 cm. No mass or microlithiasis visualized. Right epididymis:  Normal in size and appearance. Left epididymis:  Unremarkable except for small epididymal cysts. Hydrocele:  Bilateral hydroceles noted. Varicocele:  None visualized. Other: A 6.2 x 2.3 x 4.5 cm mildly complex cystic structure in the RIGHT inguinal canal noted corresponding to the CT finding. IMPRESSION: 1. 6.2 x 2.3 x 4.5 cm mildly complex cystic structure in the RIGHT inguinal canal which most likely represents a mildly complicated hydrocele. This corresponds to the recent CT finding. 2. No significant testicular findings. 3. Bilateral hydroceles. Electronically Signed   By: Margarette Canada M.D.   On: 03/17/2019 14:16   US Scrotum  Result Date: 03/17/2019 CLINICAL DATA:  78 year old male with abnormal density in the RIGHT inguinal canal on recent CT. EXAM:  SCROTAL ULTRASOUND LIMITED PELVIC ULTRASOUND TECHNIQUE: Complete ultrasound examination of the testicles, epididymis, and other scrotal structures was performed. Color and spectral Doppler ultrasound were also utilized to evaluate blood flow to the testicles. COMPARISON:  03/15/2019 and 03/09/2019 CTs FINDINGS: Right testicle Measurements: 3.4 x 1.9 x 2.4 cm. No mass identified. A peripheral calcification without adjacent mass noted. Left testicle Measurements: 3.7 x 1.7 x 2.4 cm. No mass or microlithiasis visualized. Right epididymis:  Normal in size and appearance. Left epididymis:  Unremarkable except for small epididymal cysts. Hydrocele:  Bilateral hydroceles noted. Varicocele:  None visualized. Other: A 6.2 x 2.3 x 4.5 cm mildly complex cystic structure in the RIGHT inguinal canal noted corresponding to the CT finding. IMPRESSION: 1. 6.2 x 2.3 x 4.5 cm mildly complex cystic structure in the RIGHT inguinal canal which most likely represents a mildly complicated hydrocele. This corresponds to the recent CT finding. 2.  No significant testicular findings. 3. Bilateral hydroceles. Electronically Signed   By: Margarette Canada M.D.   On: 03/17/2019 14:16   Dg Chest Port 1 View  Result Date: 03/26/2019 CLINICAL DATA:  Atrial fibrillation. EXAM: PORTABLE CHEST 1 VIEW COMPARISON:  Radiograph of March 25, 2019. FINDINGS: Stable cardiomegaly. No pneumothorax or pleural effusion is noted. Bilateral lung opacities noted on prior exam have resolved. The visualized skeletal structures are unremarkable. IMPRESSION: Bilateral lung opacities noted on prior exam have resolved. Stable cardiomegaly. Electronically Signed   By: Marijo Conception M.D.   On: 03/26/2019 11:34   Dg Chest Port 1 View  Result Date: 03/25/2019 CLINICAL DATA:  Shortness of breath EXAM: PORTABLE CHEST 1 VIEW COMPARISON:  03/05/2019 FINDINGS: Cardiomegaly. Bilateral airspace disease, right greater than left. This could reflect asymmetric edema or infection.  No visible effusions or acute bony abnormality. IMPRESSION: Bilateral airspace disease, right greater than left, asymmetric edema versus infection. Electronically Signed   By: Rolm Baptise M.D.   On: 03/25/2019 02:25   Dg Chest Port 1 View  Result Date: 03/05/2019 CLINICAL DATA:  Follow-up endotracheal tube EXAM: PORTABLE CHEST 1 VIEW COMPARISON:  03/04/2019 FINDINGS: Cardiac shadow remains enlarged. Endotracheal tube is noted in satisfactory position. Gastric catheter is noted extending into the stomach. The previously seen vascular congestion and edema has resolved in the interval. No focal infiltrate or sizable effusion is noted. No bony abnormality is seen. IMPRESSION: Interval resolution of CHF. Electronically Signed   By: Inez Catalina M.D.   On: 03/05/2019 07:54   Dg Chest Portable 1 View  Result Date: 03/04/2019 CLINICAL DATA:  Respiratory distress EXAM: PORTABLE CHEST 1 VIEW COMPARISON:  02/10/2019 FINDINGS: Endotracheal tube tip just below the clavicular heads. The orogastric tube reaches the stomach at least. Cardiomegaly with diffuse interstitial and airspace opacity. The airspace opacity is symmetric and there are [employer]. IMPRESSION: 1. Hardware in unremarkable position. 2. CHF pattern. Electronically Signed   By: Monte Fantasia M.D.   On: 03/04/2019 06:23   Ct Angio Abd/pel W/ And/or W/o  Result Date: 03/16/2019 CLINICAL DATA:  Suspected ischemic colitis EXAM: CTA ABDOMEN AND PELVIS wITHOUT AND WITH CONTRAST TECHNIQUE: Multidetector CT imaging of the abdomen and pelvis was performed using the standard protocol during bolus administration of intravenous contrast. Multiplanar reconstructed images and MIPs were obtained and reviewed to evaluate the vascular anatomy. CONTRAST:  153mL OMNIPAQUE IOHEXOL 350 MG/ML SOLN COMPARISON:  None. FINDINGS: VASCULAR Aorta: Nonaneurysmal and patent with mild scattered atherosclerotic calcifications. 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 Branch vessels patent. SMA: Patent. There is hypertrophy of pancreatic branches which provide collateral flow to the celiac axis territory. Right colic and ileocolic branches are grossly patent. Renals: Single renal arteries are patent. IMA: Patent and diminutive. Inflow: Bilateral common, internal, and external iliac arteries are patent with scattered atherosclerotic plaque. Proximal Outflow: Grossly patent. Veins: No evidence of DVT. Review of the MIP images confirms the above findings. NON-VASCULAR Lower chest: Dependent atelectasis. Cardiomegaly with severe dilatation of the left ventricle. Minimal coronary artery calcification. Hepatobiliary: Unremarkable appearance of the liver. Sludge in the gallbladder. Pancreas: Unremarkable Spleen: Unremarkable Adrenals/Urinary Tract: Several lesions scattered throughout both kidneys are noted. Some of these are hyperdense and a small renal cell carcinoma cannot be excluded. There is a 1.1 cm nonspecific nodule in the left adrenal gland. Bladder is decompressed. Stomach/Bowel: Wall thickening and adjacent inflammatory changes involving the cecum have worsened. There is no extraluminal bowel gas to suggest  perforation. There is no evidence of abscess. The appendix is within normal limits and best visualized on sagittal reconstruction imaging. Stomach and small bowel are decompressed. There is no evidence of wall thickening throughout the remainder of the colon. Lymphatic: No abnormal retroperitoneal adenopathy. Reproductive: Brachy therapy pellets in the prostate gland. Soft tissue density in the distal right inguinal region is noted. Undescended testicle is not excluded. Other: No free fluid. Musculoskeletal: No free-fluid. IMPRESSION: VASCULAR Although there is narrowing in the celiac axis due to median arcuate ligament syndrome, the day SMA and IMA are patent. Furthermore, the right and ileocolic branches of the SMA are also patent. NON-VASCULAR Wall  thickening and inflammatory changes at the cecum have worsened. There is no evidence of perforation. Cardiomegaly with severe dilatation of the left ventricle. There are several lesions throughout the kidneys. Small renal cell carcinoma cannot be excluded. Left adrenal nodule is nonspecific. If there is a history of malignancy or risk of malignancy, six-month follow-up MRI is recommended. Possible undescended right testicle.  Ultrasound is recommended. Electronically Signed   By: Marybelle Killings M.D.   On: 03/16/2019 09:58       Subjective: No new complaints.   Discharge Exam: Vitals:   03/26/19 1300 03/26/19 1330  BP: 107/65 101/64  Pulse: 87 78  Resp:    Temp:    SpO2: 100%    Vitals:   03/26/19 1200 03/26/19 1230 03/26/19 1300 03/26/19 1330  BP: 106/67 93/64 107/65 101/64  Pulse: 86 87 87 78  Resp: 17     Temp:      TempSrc:      SpO2: 99%  100%   Weight:      Height:        General: Pt is alert, awake, not in acute distress Cardiovascular: RRR, S1/S2 +, no rubs, no gallops Respiratory: CTA bilaterally, no wheezing, no rhonchi Abdominal: Soft, NT, ND, bowel sounds + Extremities: no edema, no cyanosis    The results of significant diagnostics from this hospitalization (including imaging, microbiology, ancillary and laboratory) are listed below for reference.     Microbiology: Recent Results (from the past 240 hour(s))  Blood Culture (routine x 2)     Status: None (Preliminary result)   Collection Time: 03/25/19  1:55 AM  Result Value Ref Range Status   Specimen Description BLOOD LEFT HAND  Final   Special Requests   Final    BOTTLES DRAWN AEROBIC AND ANAEROBIC Blood Culture results may not be optimal due to an inadequate volume of blood received in culture bottles   Culture   Final    NO GROWTH 1 DAY Performed at [facility], [place] [address] [place], [place] [address]    Report Status PENDING  Incomplete  SARS Coronavirus 2 [facility] order, Performed in  [place] hospital lab)     Status: None   Collection Time: 03/25/19  2:03 AM  Result Value Ref Range Status   SARS Coronavirus 2 NEGATIVE NEGATIVE Final    Comment: (NOTE) If result is NEGATIVE SARS-CoV-2 target nucleic acids are NOT DETECTED. The SARS-CoV-2 RNA is generally detectable in upper and lower  respiratory specimens during the acute phase of infection. The lowest  concentration of SARS-CoV-2 viral copies this assay can detect is 250  copies / mL. A negative result does not preclude SARS-CoV-2 infection  and should not be used as the sole basis for treatment or other  patient management decisions.  A negative result may occur with  improper specimen collection / handling, submission of specimen other  than nasopharyngeal swab, presence of viral mutation(s) within the  areas targeted by this assay, and inadequate number of viral copies  (<250 copies / mL). A negative result must be combined with clinical  observations, patient history, and epidemiological information. If result is POSITIVE SARS-CoV-2 target nucleic acids are DETECTED. The SARS-CoV-2 RNA is generally detectable in upper and lower  respiratory specimens dur ing the acute phase of infection.  Positive  results are indicative of active infection with SARS-CoV-2.  Clinical  correlation with patient history and other diagnostic information is  necessary to determine patient infection status.  Positive results do  not rule out bacterial infection or co-infection with other viruses. If result is PRESUMPTIVE POSTIVE SARS-CoV-2 nucleic acids MAY BE PRESENT.   A presumptive positive result was obtained on the submitted specimen  and confirmed on repeat testing.  While 2019 novel coronavirus  (SARS-CoV-2) nucleic acids may be present in the submitted sample  additional confirmatory testing may be necessary for epidemiological  and / or clinical management purposes  to differentiate between  SARS-CoV-2 and other  Sarbecovirus currently known to infect humans.  If clinically indicated additional testing with an alternate test  methodology [phone]) is advised. The SARS-CoV-2 RNA is generally  detectable in upper and lower respiratory sp ecimens during the acute  phase of infection. The expected result is Negative. Fact Sheet for Patients:  StrictlyIdeas.no Fact Sheet for Healthcare Providers: BankingDealers.co.za This test is not yet approved or cleared by the [place] FDA and has been authorized for detection and/or diagnosis of SARS-CoV-2 by FDA under an Emergency Use Authorization (EUA).  This EUA will remain in effect (meaning this test can be used) for the duration of the COVID-19 declaration under Section 564(b)(1) of the Act, 21 U.S.C. section 360bbb-3(b)(1), unless the authorization is terminated or revoked sooner. Performed at [facility], [place] [address], [place], [place] [address]   Blood Culture (routine x 2)     Status: None (Preliminary result)   Collection Time: 03/25/19  2:15 AM  Result Value Ref Range Status   Specimen Description BLOOD LEFT ANTECUBITAL  Final   Special Requests   Final    BOTTLES DRAWN AEROBIC AND ANAEROBIC Blood Culture adequate volume   Culture   Final    NO GROWTH 1 DAY Performed at [facility], [place] [address], [place], [place] [address]    Report Status PENDING  Incomplete     Labs: BNP (last 3 results) Recent Labs    12/04/18 2207 02/11/19 0015 03/04/19 0519  BNP 1,630.5* 1,054.2* 8,299.3*   Basic Metabolic Panel: Recent Labs  Lab 03/25/19 0148 03/25/19 0424 03/26/19 0338  NA 137 138 137  K 4.2 4.7 4.5  CL 98 96* 96*  CO2 19* 21* 26  GLUCOSE 202* 160* 99  BUN 58* 59* 30*  CREATININE 9.57* 10.09* 6.78*  CALCIUM 9.3 9.4 9.0  PHOS 6.3*  --   --    Liver Function Tests: Recent Labs  Lab 03/25/19 0148  ALBUMIN 3.5   No results for input(s): LIPASE, AMYLASE in the  last 168 hours. No results for input(s): AMMONIA in the last 168 hours. CBC: Recent Labs  Lab 03/25/19 0215 03/25/19 0424 03/26/19 0338  WBC 24.1* 19.0* 8.3  NEUTROABS 18.6* 17.0* 5.7  HGB 11.3* 11.2* 10.0*  HCT 36.5* 36.2* 31.2*  MCV 110.9* 111.4* 106.1*  PLT 397 310 273   Cardiac Enzymes:  No results for input(s): CKTOTAL, CKMB, CKMBINDEX, TROPONINI in the last 168 hours. BNP: Invalid input(s): POCBNP CBG: Recent Labs  Lab 03/25/19 2000 03/26/19 0044 03/26/19 0455 03/26/19 0817 03/26/19 1134  GLUCAP 110* 118* 88 104* 170*   D-Dimer Recent Labs    03/25/19 0230  DDIMER 2.99*   Hgb A1c No results for input(s): HGBA1C in the last 72 hours. Lipid Profile Recent Labs    03/25/19 0148  TRIG 116   Thyroid function studies No results for input(s): TSH, T4TOTAL, T3FREE, THYROIDAB in the last 72 hours.  Invalid input(s): FREET3 Anemia work up Recent Labs    03/25/19 0215  FERRITIN 1,078*   Urinalysis    Component Value Date/Time   COLORURINE YELLOW 02/17/2018 0540   APPEARANCEUR CLOUDY (A) 02/17/2018 0540   LABSPEC 1.010 02/17/2018 0540   PHURINE 5.0 02/17/2018 0540   GLUCOSEU NEGATIVE 02/17/2018 0540   HGBUR LARGE (A) 02/17/2018 0540   BILIRUBINUR NEGATIVE 02/17/2018 0540   BILIRUBINUR negative 04/16/2013 1447   KETONESUR NEGATIVE 02/17/2018 0540   PROTEINUR 100 (A) 02/17/2018 0540   UROBILINOGEN 0.2 05/11/2014 1352   NITRITE NEGATIVE 02/17/2018 0540   LEUKOCYTESUR NEGATIVE 02/17/2018 0540   Sepsis Labs Invalid input(s): PROCALCITONIN,  WBC,  LACTICIDVEN Microbiology Recent Results (from the past 240 hour(s))  Blood Culture (routine x 2)     Status: None (Preliminary result)   Collection Time: 03/25/19  1:55 AM  Result Value Ref Range Status   Specimen Description BLOOD LEFT HAND  Final   Special Requests   Final    BOTTLES DRAWN AEROBIC AND ANAEROBIC Blood Culture results may not be optimal due to an inadequate volume of blood received in  culture bottles   Culture   Final    NO GROWTH 1 DAY Performed at [facility], [place] [address] [place], [place] [address]    Report Status PENDING  Incomplete  SARS Coronavirus 2 [facility] order, Performed in [place] hospital lab)     Status: None   Collection Time: 03/25/19  2:03 AM  Result Value Ref Range Status   SARS Coronavirus 2 NEGATIVE NEGATIVE Final    Comment: (NOTE) If result is NEGATIVE SARS-CoV-2 target nucleic acids are NOT DETECTED. The SARS-CoV-2 RNA is generally detectable in upper and lower  respiratory specimens during the acute phase of infection. The lowest  concentration of SARS-CoV-2 viral copies this assay can detect is 250  copies / mL. A negative result does not preclude SARS-CoV-2 infection  and should not be used as the sole basis for treatment or other  patient management decisions.  A negative result may occur with  improper specimen collection / handling, submission of specimen other  than nasopharyngeal swab, presence of viral mutation(s) within the  areas targeted by this assay, and inadequate number of viral copies  (<250 copies / mL). A negative result must be combined with clinical  observations, patient history, and epidemiological information. If result is POSITIVE SARS-CoV-2 target nucleic acids are DETECTED. The SARS-CoV-2 RNA is generally detectable in upper and lower  respiratory specimens dur ing the acute phase of infection.  Positive  results are indicative of active infection with SARS-CoV-2.  Clinical  correlation with patient history and other diagnostic information is  necessary to determine patient infection status.  Positive results do  not rule out bacterial infection or co-infection with other viruses. If result is PRESUMPTIVE POSTIVE SARS-CoV-2 nucleic acids MAY BE PRESENT.   A presumptive positive result was obtained on  the submitted specimen  and confirmed on repeat testing.  While 2019 novel coronavirus   (SARS-CoV-2) nucleic acids may be present in the submitted sample  additional confirmatory testing may be necessary for epidemiological  and / or clinical management purposes  to differentiate between  SARS-CoV-2 and other Sarbecovirus currently known to infect humans.  If clinically indicated additional testing with an alternate test  methodology [phone]) is advised. The SARS-CoV-2 RNA is generally  detectable in upper and lower respiratory sp ecimens during the acute  phase of infection. The expected result is Negative. Fact Sheet for Patients:  StrictlyIdeas.no Fact Sheet for Healthcare Providers: BankingDealers.co.za This test is not yet approved or cleared by the [place] FDA and has been authorized for detection and/or diagnosis of SARS-CoV-2 by FDA under an Emergency Use Authorization (EUA).  This EUA will remain in effect (meaning this test can be used) for the duration of the COVID-19 declaration under Section 564(b)(1) of the Act, 21 U.S.C. section 360bbb-3(b)(1), unless the authorization is terminated or revoked sooner. Performed at [facility], [place] [address], [place], [place] [address]   Blood Culture (routine x 2)     Status: None (Preliminary result)   Collection Time: 03/25/19  2:15 AM  Result Value Ref Range Status   Specimen Description BLOOD LEFT ANTECUBITAL  Final   Special Requests   Final    BOTTLES DRAWN AEROBIC AND ANAEROBIC Blood Culture adequate volume   Culture   Final    NO GROWTH 1 DAY Performed at [facility], [place] [address] [place], [place] [address]    Report Status PENDING  Incomplete     Time coordinating discharge:34  minutes  SIGNED:   Hosie Poisson, MD  [facility] 03/26/2019, 2:05 PM Pager   If 7PM-7AM, please contact night-coverage [email or web address] Password [email or web address]

## 2019-03-27 ENCOUNTER — Telehealth: Payer: Medicare HMO | Admitting: Cardiovascular Disease

## 2019-03-27 DIAGNOSIS — N2581 Secondary hyperparathyroidism of renal origin: Secondary | ICD-10-CM | POA: Diagnosis not present

## 2019-03-27 DIAGNOSIS — Z992 Dependence on renal dialysis: Secondary | ICD-10-CM | POA: Insufficient documentation

## 2019-03-27 DIAGNOSIS — N186 End stage renal disease: Secondary | ICD-10-CM | POA: Diagnosis not present

## 2019-03-27 DIAGNOSIS — I132 Hypertensive heart and chronic kidney disease with heart failure and with stage 5 chronic kidney disease, or end stage renal disease: Secondary | ICD-10-CM | POA: Insufficient documentation

## 2019-03-27 DIAGNOSIS — J810 Acute pulmonary edema: Secondary | ICD-10-CM | POA: Diagnosis not present

## 2019-03-28 ENCOUNTER — Ambulatory Visit: Payer: Medicare HMO | Admitting: Cardiovascular Disease

## 2019-03-28 ENCOUNTER — Telehealth: Payer: Self-pay | Admitting: Cardiovascular Disease

## 2019-03-28 ENCOUNTER — Telehealth: Payer: Self-pay

## 2019-03-28 NOTE — Telephone Encounter (Signed)
Per Philippa Chester at [facility] lab correction on blood culture from 4/21  was gram positive for rods Will forward to Dr Sallyanne Kuster for review .Adonis Housekeeper

## 2019-03-28 NOTE — Telephone Encounter (Signed)
Spoke with pt, he is not able to come on 5-14 that is a dialysis day. He does not want a telephone visit and can not do video, he only wants to come into the office. Follow up scheduled with DOD next week.

## 2019-03-28 NOTE — Telephone Encounter (Signed)
Not entirely sure why Philippa Chester from microbiology is calling Cardiology with this... This is normal mouth flora and may just be a contaminant, but has rarely been reported to cause endovascular infection. Will forward this to Dr. Tommy Medal, who is [facility] ID specialist. Thanks, Jac Canavan

## 2019-03-28 NOTE — Telephone Encounter (Signed)
03/28/19  Transition Care Management Follow-up Telephone Call  ADMISSION DATE: 03/25/19  DISCHARGE DATE: 03/26/19    How have you been since you were released from the hospital? Feeling better per patient    Do you understand why you were in the hospital? Yes   Do you understand the discharge instrcutions? Yes    Items Reviewed: Medications reviewed: Yes   Allergies reviewed: Yes  Dietary changes reviewed: Renal  (Regular per patient)  Referrals reviewed: Appointment scheduled for hospital follow up  Functional Questionnaire:   Activities of Daily Living (ADLs): Patient can perform all independently  Any patient concerns? None at this time per patient.   Confirmed importance and date/time of follow-up visits scheduled: Yes    Confirmed with patient if condition begins to worsen call PCP or go to the ER. Yes      Patient was given the office number and encouragred to call back with questions or concerns.Yes

## 2019-03-28 NOTE — Telephone Encounter (Signed)
Left message for pt to call.

## 2019-03-28 NOTE — Telephone Encounter (Signed)
I am DOD May 14 if he wants to come in then. Not sure if that is a dialysis day for him MCr

## 2019-03-28 NOTE — Telephone Encounter (Signed)
Pt called following his discharge paper work want a  TCM appt, pt would like to know if he can see the doctor face to face please advice. He can only come in on  Wed and Fri. He has dialysis

## 2019-03-28 NOTE — Telephone Encounter (Signed)
Follow up  ° ° °Patient is returning call.  °

## 2019-03-28 NOTE — Telephone Encounter (Signed)
New message   Per Philippa Chester needs a return call in reference to a blood culture. Please call.

## 2019-03-28 NOTE — Telephone Encounter (Signed)
Not sure either. Thanks.

## 2019-03-28 NOTE — Telephone Encounter (Signed)
Spoke with pt, he is not having any problems but he wants to come in the office to be checked. Explained we are only seeing emergencies at this time. He has no way to do a video visit and thinks he needs more than just talking. Explained will forward to dr croitoru to get his recommendations regarding appointment.

## 2019-03-29 DIAGNOSIS — J810 Acute pulmonary edema: Secondary | ICD-10-CM | POA: Diagnosis not present

## 2019-03-29 DIAGNOSIS — N2581 Secondary hyperparathyroidism of renal origin: Secondary | ICD-10-CM | POA: Diagnosis not present

## 2019-03-29 DIAGNOSIS — N186 End stage renal disease: Secondary | ICD-10-CM | POA: Diagnosis not present

## 2019-03-29 LAB — CULTURE, BLOOD (ROUTINE X 2): Special Requests: ADEQUATE

## 2019-03-30 LAB — CULTURE, BLOOD (ROUTINE X 2): Culture: NO GROWTH

## 2019-03-31 DIAGNOSIS — J810 Acute pulmonary edema: Secondary | ICD-10-CM | POA: Diagnosis not present

## 2019-03-31 DIAGNOSIS — N2581 Secondary hyperparathyroidism of renal origin: Secondary | ICD-10-CM | POA: Diagnosis not present

## 2019-03-31 DIAGNOSIS — N186 End stage renal disease: Secondary | ICD-10-CM | POA: Diagnosis not present

## 2019-03-31 NOTE — Telephone Encounter (Signed)
Left message asking for call back today on patient's phone number and his emergency contact's number.  Per Dr Tommy Medal, patient should come into clinic this afternoon 4/27 for assessment, potential hospital admission based on blood cultures. Voicemail left asking them to call back asap.  RN will try again. Landis Gandy, RN

## 2019-03-31 NOTE — Telephone Encounter (Signed)
Thank you Dani Gobble

## 2019-03-31 NOTE — Telephone Encounter (Signed)
It is possible it could have been introduced by flora from phlebotomist but not having seen pt I dont know if he might for example have an odotogenic infection and true bacteremia. Acintomyces dont grow very readily

## 2019-03-31 NOTE — Telephone Encounter (Signed)
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 If there is uncertainty then he should go to ER for admission and workup

## 2019-04-01 ENCOUNTER — Encounter: Payer: Self-pay | Admitting: Cardiology

## 2019-04-01 DIAGNOSIS — J810 Acute pulmonary edema: Secondary | ICD-10-CM | POA: Diagnosis not present

## 2019-04-01 DIAGNOSIS — N186 End stage renal disease: Secondary | ICD-10-CM | POA: Diagnosis not present

## 2019-04-01 DIAGNOSIS — N2581 Secondary hyperparathyroidism of renal origin: Secondary | ICD-10-CM | POA: Diagnosis not present

## 2019-04-01 NOTE — Progress Notes (Signed)
PCP: [name], NP  Cardiologist: [name]  Clinic Note:  Hospital f/u - acute on chronic hypoxic respiratory failure as a complication of ESRD, Afib RVR (with RBBB) & Acute on Chronic Combined Systolic & Diastolic HF (known NICM - EF ~15-25%)  Chief Complaint  Patient presents with  . Hospitalization Follow-up    volume overload  . Cardiomyopathy    Non-ischemic  . Atrial Fibrillation    with LBBB    HPI: David [name] is a 78 y.o. 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 HIV related), PAF (onAmiodarone and Coumadin), s/p PEA arrest in 02/2018, known LBBB, E and SRD on hemodialysis Tue/Thur/Sat, and HIV who is being seen today for hospital follow-up.   He was seen for inpatient consultation by [name] for the evaluation of pulmonary edema during the late March, early April hospitalization.  Cardiac History  Echo from 08/2017 - EF 25-30%  -- was supposed to have Coronary CT Angiogram (did not have)  March 2019: Admitted for PEA arrest  Cath -nonobstructive CAD with 50% ostial OM 2 and 25% mid LAD  Echocardiogram: Moderately dilated LV with EF 50 to 20% with diffuse hypokinesis.  Inferior lateral akinesis.  Hospitalization complicated by new onset A. fib RVR and progression to hemodialysis  Volume status is been difficult because of hypotension related to hemodialysis --> requires midodrine 10 mg daily on nondialysis days and twice daily of dialysis days.  Multiple hospitalizations over the last several months for acute pulmonary edema and hypoxic restaurant failure requiring BiPAP.   Exacerbations usually occur after weekend delayed HD interval. - Not a candidate for CRT, LVAD or transplantation due to comorbid conditions (ESRD, HIV, etc.) and high risk of complications with advanced therapies.  [facility] was last seen  in cardiology clinic on December 27, 2018 by Almyra Deforest, PA as hospital follow-up for recent CHF Nishan-pulmonary medicine and BiPAP.  Blood pressure was 90/56.  wgt was 3 lb down from Dec 2019. --Denied any significant dyspnea or chest discomfort. -->  Nephrology had recommended increasing midodrine to twice daily on dialysis days and once daily on nondialysis days.  Also encouraged increased hydration.  Recent Hospitalizations:   March 9-11, 2020: (First admission since January)  Shortness of breath x1 day--pulmonary edema, started on BiPAP.  HD performed.. Treated with IV antibiotics for possible pneumonia  March 31-March 06, 2019:   [facility] via EMS with oxygen saturations in the 70s on room air only improved 80s on nonrebreather.  Transition to CPAP (did not tolerate BiPAP due to nausea and vomiting) --> was intubated for airway protection (extubated after 1 day)    ? Abx for possible PNA.   Dr. Victorino December recommendations:  " Not a candidate for CRT, LVAD or transplantation due to comorbid conditions (ESRD, HIV, prostate cancer, etc) and high risk for complications and advanced therapies."  I do not have much to offer at this time as it appears that he has been unable to tolerate any heart failure medications in the past.  Volume management as per hemodialysis which is limited by low blood pressure."  "Recommend increasing HD day midodrine to 10 mg in a.m. and 10 mg at noon as well as 5 mg on non-HD days. 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   Admitted for Abx (planned 7 d IV Abx.   Plan was for Palliative Care f/u as OP.   No CHF issues  April 21-22, 2020: Again, presented with acute hypoxic respiratory failure -- HD x 2  Was in Afib with RVR  (atypical LBBB --wide-complex tachycardia)) -- started on IV Amiodarone --> chemically cardioverted to sinus rhythm.   transition back to p.o. amiodarone.  INR was therapeutic.  Studies Personally Reviewed - (if available, images/films reviewed: From Epic Chart or Care Everywhere)  2D Echo February 06, 2012: EF 45-50%.  Mild global HK.  GR 1 DD.  Moderate LA dilation.  Mild to moderate MR.  Aortic sclerosis with mild-moderate AI.   2D Echo August 08, 2017: Severely reduced EF.  25 from 30%.  GR 1 DD with diffuse ecchymosis. Mild AS & AI.  Mild LA/RA dilation.    2D Echo February 18, 2018: Moderately dilated LV with moderate concentric virtually.  EF 15 to 20%.  GR 1 DD.  Paradoxical septal motion. Mild AI.  Mild LA dilation.  Poorly visualized RV.  2D Echo March 04, 2019: EF 20-25%.  Unable to assess diastolic function (A. Fib).  No LV apical thrombus.  Mild AI.  Mildly reduced RV function, however poorly visualized.   LH Cath-Coronary Angiography: Ost Om2 50%. mLAD 25%. Normal LVEDP.  -- Non-obstructive CAD  Interval History: David [name] presents here today walking him with a cane.  He actually seems to be doing relatively well.  He states that he really has not had that much in the way of any edema, mostly notes having swelling in his belly.  His weight is up a bit from his last discharge weight, but he says he has been eating a lot better now that his abdomen is feeling better.  He says that they are pulling off about a liter to a liter and a half on dialysis, and he is now doing dialysis 4 times a day.  His blood pressure still goes down into about the 80s during dialysis but usually perks back up again by the end of dialysis.  They sometimes have to go to filtration and not pulling fluid for a period of time during his dialysis.  He has not had any further episodes of PND and orthopnea or profound dyspnea since his last discharge.  Again his appetite is getting little bit better and he is eating a  little bit more so is difficult to really tell his weight.  He is not having any chest pain or pressure with rest or exertion.  No signs of recurrence of A. fib with no rapid irregular heartbeats or palpitations. He does get short of breath if he tries to walk up stairs or anything more than just simply walking around the house.  This is not a new thing for him.  He does get a little lightheaded when his blood pressure goes down during dialysis, but no syncope or near syncope type symptoms.  No TIA/amaurosis fugax symptoms. No melena, hematochezia, or epstaxis. No claudication.  He does have a scheduled phone conversation with palliative care/hospice care in the outpatient setting.  He is not sure at this time of what this is all about.  ROS: A comprehensive was performed. Review of Systems  Constitutional: Positive for malaise/fatigue (Very weak from multiple hospitalizations).  HENT: Negative for congestion.  Respiratory: Negative for cough (Only when he starts getting fluid overloaded) and shortness of breath (If he overexerts which is not significant amount of walking.).   Cardiovascular: Negative for claudication.  Gastrointestinal: Negative for abdominal pain, constipation, diarrhea and heartburn.  Musculoskeletal: Positive for back pain and joint pain.  Neurological: Positive for dizziness (Positional) and weakness. Negative for focal weakness.  Psychiatric/Behavioral: Positive for memory loss (Has a hard time recollecting a lot of the recent episodes.). Negative for depression.  All other systems reviewed and are negative.  I have reviewed and (if needed) personally updated the patient's problem list, medications, allergies, past medical and surgical history, social and family history.   Past Medical History:  Diagnosis Date  . 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 Acute on chronic systolic and diastolic heart failure, NYHA class 4 ([place])   . Anemia, iron deficiency 11/15/2011  .  Arthritis    "hands, right knee, feet" (02/21/2018)  . Atrial fibrillation ([place])   . Cancer [facility])    hx of prostate; s/p radioactive seed implant 10/2009 Dr Janice Norrie  . Cardiac arrest ([place]) 02/17/2018  . CHF (congestive heart failure) ([place])   . Chronic combined systolic and diastolic heart failure, NYHA class 3 ([place]) 06/2010   felt to be secondary to hypertensive cardiomyopathy  . Chronic lower back pain   . CKD (chronic kidney disease) stage V requiring chronic dialysis ([place])   . ESRD on dialysis ([place])    started 02/2018  . GERD (gastroesophageal reflux disease)   . Glaucoma   . Gout    daily RX (02/21/2018)  . Heart murmur    "mild" per pt  . Hepatitis    years ago  . HIV (human immunodeficiency virus infection) ([place])   . HIV infection ([place])   . Hyperlipidemia   . Hypertension    followed by [facility] and Vascular (Dr Dani Gobble Croitoru)  . Hypertension   . Nonischemic cardiomyopathy ([place])   . Pneumonia 11/2017  . Prostate cancer ([place])   . Sinus bradycardia   . Sleep apnea    does not use a cpap  . Stroke [facility])    "mini stroke" years ago    Past Surgical History:  Procedure Laterality Date  . AV FISTULA PLACEMENT Right 10/13/2016   Procedure: ARTERIOVENOUS (AV) FISTULA CREATION;  Surgeon: [name], MD;  Location: [place];  Service: Vascular;  Laterality: Right;  . AV FISTULA PLACEMENT Left 02/25/2018   Procedure: INSERTION OF ARTERIOVENOUS (AV) GORE-TEX GRAFT LEFT UPPER ARM;  Surgeon: Angelia Mould, MD;  Location: [place];  Service: Vascular;  Laterality: Left;  . AV FISTULA PLACEMENT Right 08/07/2018   Procedure: Creation of right arm brachiocephalic Fistula;  Surgeon: Waynetta Sandy, MD;  Location: [place];  Service: Vascular;  Laterality: Right;  . BASCILIC VEIN TRANSPOSITION Left 06/24/2015   Procedure: BASILIC VEIN TRANSPOSITION;  Surgeon: Mal Misty, MD;  Location: [place];  Service: Vascular;  Laterality: Left;  . BIOPSY  03/14/2019   Procedure:  BIOPSY;  Surgeon: Irene Shipper, MD;  Location: [place];  Service: Endoscopy;;  . CATARACT EXTRACTION, BILATERAL Bilateral   . COLONOSCOPY WITH PROPOFOL N/A 03/14/2019   Procedure: COLONOSCOPY WITH PROPOFOL;  Surgeon: Irene Shipper, MD;  Location: [facility] END[facility]COPY;  Service: Endoscopy;  Laterality: N/A;  . ESOPHAGOGASTRODUODENOSCOPY (EGD) WITH PROPOFOL N/A 05/05/2014   Procedure: ESOPHAGOGASTRODUODENOSCOPY (EGD) WITH PROPOFOL;  Surgeon: Missy Sabins, MD;  Location: [facility] ENDOSCOPY;  Service: Endoscopy;  Laterality: N/A;  . EYE  SURGERY Bilateral    cataract surgery   . GLAUCOMA SURGERY Bilateral   . INSERTION OF ARTERIOVENOUS (AV) ARTEGRAFT ARM  10/09/2018   Procedure: INSERTION OF ARTERIOVENOUS (AV) 63mm x 41cm ARTEGRAFT LEFT UPPER ARM;  Surgeon: Waynetta Sandy, MD;  Location: [place];  Service: Vascular;;  . INSERTION PROSTATE RADIATION SEED    . IR FLUORO GUIDE CV LINE RIGHT  02/18/2018  . IR REMOVAL TUN CV CATH W/O FL  12/06/2018  . IR US GUIDE VASC ACCESS RIGHT  02/18/2018  . LEFT HEART CATH AND CORONARY ANGIOGRAPHY N/A 02/20/2018   Procedure: LEFT HEART CATH AND CORONARY ANGIOGRAPHY;  Surgeon: Jettie Booze, MD;  Location: [place] CV LAB;;; 50% ostial OM2, 25% mLAD.  Marland Kitchen LEFT HEART CATH AND CORONARY ANGIOGRAPHY  2004   mildly depressed LV systolic fx EF 50%,[id number] coronaries/abdominal aorta/renal arteries.  Marland Kitchen NM MYOCAR PERF WALL MOTION  02/21/2010   No ischemia or infarction.  EF 27%.  Marland Kitchen RADIOACTIVE SEED IMPLANT  2010   prostate cancer  . THROMBECTOMY W/ EMBOLECTOMY Left 02/26/2018   Procedure: THROMBECTOMY ARTERIOVENOUS GRAFT;  Surgeon: Angelia Mould, MD;  Location: [place];  Service: Vascular;  Laterality: Left;  . TRANSTHORACIC ECHOCARDIOGRAM  02/2012   [facility]) EF 45-50% with mild global HK.  Mild LVH,LA mod. dilated,mild-mod. 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 AI.  Marland Kitchen TRANSTHORACIC ECHOCARDIOGRAM  9/'18; 3/'19   a) Severely reduced EF.   25 from 30%.  GR 1 DD with diffuse ecchymosis. Mild AS & AI.  Mild LA/RA dilation; b) (post PEA arest):   Moderately dilated LV with moderate concentric virtually.  EF 15 to 20%.  GR 1 DD.  Paradoxical septal motion. Mild AI.  Mild LA dilation.  Poorly visualized RV.  Marland Kitchen TRANSTHORACIC ECHOCARDIOGRAM  02/2019   EF 20-25%.  Unable to assess diastolic function (A. Fib).  No LV apical thrombus.  Mild AI.  Mildly reduced RV function, however poorly visualized.  Marland Kitchen ULTRASOUND GUIDANCE FOR VASCULAR ACCESS  02/20/2018   Procedure: Ultrasound Guidance For Vascular Access;  Surgeon: Jettie Booze, MD;  Location: [place] CV LAB;  Service: Cardiovascular;;  . UPPER EXTREMITY VENOGRAPHY N/A 07/08/2018   Procedure: UPPER EXTREMITY VENOGRAPHY;  Surgeon: Waynetta Sandy, MD;  Location: [place] CV LAB;  Service: Cardiovascular;  Laterality: N/A;  Bilateral    Current Meds  Medication Sig  . acetaminophen (TYLENOL) 500 MG tablet Take 500-1,000 mg by mouth every 6 (six) hours as needed for mild pain.   Marland Kitchen allopurinol (ZYLOPRIM) 100 MG tablet TAKE 1 TABLET EVERY DAY (Patient taking differently: Take 100 mg by mouth daily. )  . amiodarone (PACERONE) 200 MG tablet TAKE 1 TABLET(200 MG) BY MOUTH DAILY (Patient taking differently: Take 200 mg by mouth daily. )  . AURYXIA 1 GM 210 MG(Fe) tablet Take 420 mg by mouth 3 (three) times daily with meals.   . bictegravir-emtricitabine-tenofovir AF ([name]) 50-200-25 MG TABS tablet Take 1 tablet by mouth daily.  . brimonidine (ALPHAGAN) 0.15 % ophthalmic solution Place 1 drop into the left eye 2 (two) times daily.  . calcitRIOL (ROCALTROL) 0.5 MCG capsule Take 1 capsule (0.5 mcg total) by mouth 3 (three) times a week.  . dorzolamide-timolol (COSOPT) 22.3-6.8 MG/ML ophthalmic solution Place 1 drop into the left eye 2 (two) times daily.  Marland Kitchen latanoprost (XALATAN) 0.005 % ophthalmic solution Place 1 drop into the left eye at bedtime.   Marland Kitchen loratadine (CLARITIN) 10  MG tablet  Take 1 tablet (10 mg total) by mouth daily.  . midodrine (PROAMATINE) 10 MG tablet Take 1 tablet (10 mg total) by mouth as directed. Twice a day on the day of HD Tuesday, Thursday, Saturday (1 in AM and 1 at [place]). Take 1/2 tablet (5mg ) a day on non HD days  . multivitamin (RENA-VIT) TABS tablet Take 1 tablet by mouth daily.  Marland Kitchen warfarin (COUMADIN) 5 MG tablet Take 2 tablets by mouth daily or as directed by coumadin clinic (Patient taking differently: Take 10 mg by mouth daily. )    Allergies  Allergen Reactions  . Cozaar [Losartan Potassium] Other (See Comments)    Causes constipation     Social History   Tobacco Use  . Smoking status: Former Smoker    Packs/day: 1.00    Years: 30.00    Pack years: 30.00    Types: Cigarettes    Last attempt to quit: 2006    Years since quitting: 14.3  . Smokeless tobacco: Never Used  Substance Use Topics  . Alcohol use: Yes    Comment: once a week-wine  . Drug use: Never   Social History   Social History Narrative   ** Merged History Encounter **       Stays active at home Regular exercise: no Drinks 2 cups of coffee a week, 1 mountain dew soda a day.    family history includes Cholelithiasis in his daughter and son; Diabetes in his maternal grandmother; Hypertension in his maternal grandmother and mother; Thyroid disease in his mother.  Wt Readings from Last 3 Encounters:  04/02/19 181 lb 1.6 oz (82.1 kg)  04/02/19 181 lb 12.8 oz (82.5 kg)  03/26/19 174 lb 6.1 oz (79.1 kg)    PHYSICAL EXAM BP (!) 142/68   Pulse 72   Ht 6\' 2"  (1.88 m)   Wt 181 lb 12.8 oz (82.5 kg)   BMI 23.34 kg/m  Physical Exam  Constitutional: He is oriented to person, place, and time. No distress.  Chronic ill-appearing early gentleman.  He appears older than stated age.  Walking with a cane with a somewhat antalgic gait from back pain.  Somewhat unsteady gait.  Does not have appearance of frank malnourishment, but does have some temporal wasting.   HENT:  Head: Normocephalic and atraumatic.  Mouth/Throat: Oropharynx is clear and moist.  Eyes: EOM are normal.  Neck: Normal range of motion. Neck supple. JVD (Mildly elevated JVD roughly 8 cm water.) present. No hepatojugular reflux present. Carotid bruit is not present (Difficult to tell if this is from fistula or true carotid bruit).  Cardiovascular: Normal rate, regular rhythm, S1 normal and S2 normal.  Occasional extrasystoles are present. PMI is displaced (Somewhat laterally displaced and sustained.). Exam reveals gallop (This is either split S2 from bundle branch block versus gallop), S4, distant heart sounds and decreased pulses (But palpable pedal pulses). Exam reveals no friction rub.  Murmur (Cannot exclude soft 1/6 SEM at RUSB) heard. Pulmonary/Chest: Effort normal and breath sounds normal. No respiratory distress. He has no wheezes. He has no rales.  Abdominal: Soft. Bowel sounds are normal. He exhibits no distension. There is no abdominal tenderness. There is no rebound.  Musculoskeletal: Normal range of motion.        General: No edema.  Neurological: He is alert and oriented to person, place, and time.  Skin: He is not diaphoretic.  Psychiatric: His behavior is normal. Thought content normal.  Somewhat blunted and flat mood and affect.  I  question how much of what I said was being understood.  Required multiple times explaining  Vitals reviewed.    Adult ECG Report  Rate: 72;  Rhythm: normal sinus rhythm and premature ventricular contractions (PVC); LBBB with left axis deviation.  Narrative Interpretation: Relatively stable overall, but sinus rhythm has replaced atrial flutter   Other studies Reviewed: Additional studies/ records that were reviewed today include:  Recent Labs:    Lab Results  Component Value Date   CREATININE 6.78 (H) 03/26/2019   BUN 30 (H) 03/26/2019   NA 137 03/26/2019   K 4.5 03/26/2019   CL 96 (L) 03/26/2019   CO2 26 03/26/2019     ASSESSMENT / PLAN: Problem List Items Addressed This Visit    Chronic combined systolic and diastolic heart failure, NYHA class 3 (HCC) (Chronic)    Severe dilated cardiomyopathy with multiple recent hospitalizations at least half of them have been for CHF symptoms or related to his cardiomyopathy.  Relatively poor prognosis with class III and almost 4 symptoms.  Hypoperfusion during dialysis and is essentially dialysis dependent for volume management.  As per Dr. Dani Gobble Croitoru's comments, David [name] would not be a candidate for CRT, LVAD or transplant as well as any potential advanced care options for his CHF with his comorbidities.  At this time, I explained to him [name] and myself both believe that we are extremely limited to what we can provide for him from a cardiac standpoint.  I did say that if he has lowish blood pressures on off days, would be okay to take additional midodrine.  Otherwise, we are not able to use the classic cardiac medications such as beta-blocker, ARB/Entresto or spironolactone given lack of blood pressure and inability control volume status without dialysis.      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 I explained to him that I think we are nearing end-stage as far as his renal disease and requiring 4 days a week dialysis and recurrent hospitalizations for CHF as well as hypoperfusion.  Limited options for treatment.  I do strongly recommend that he discuss goals of care with the palliative care team.  I explained to him that we are unable to offer anything further from a heart failure standpoint and that  at this point in time we are limited to volume removal from dialysis and attempt to maintain his blood pressures.  Would recommend that there be strong discussions with his primary physicians (PCP, cardiologist and nephrologist as well as the palliative care team) as to determining his CODE STATUS and final goals of care.  I do not think that starting discussions about possible hospice are reasonable.      Hemodialysis-associated hypotension (Chronic)    This is, the rate limiting step for the patient and that he is dependent on dialysis volume removal, but has low blood pressure during dialysis requiring Midrin to allow for blood pressure prior to dialysis.  Between [name] and his nephrologist, they have set up a regimen for midodrine, however he is still had admissions for CHF. Currently now the plan is less volume removal per dialysis, but now having for dialysis days per week  as opposed to 3.  Hopefully this will help with reducing the chance of hypotension and also avoiding the prolonged weekend delay for volume removal.  Need to ensure that his dry weights are stable.      LBBB (left bundle branch block) (Chronic)    Chronic finding.  Classically this would make him a candidate for CRT, but however in a dialysis patient he would not be a candidate.      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  Thought to be related to hypertension, however cannot exclude component of HIV disease and left bundle branch block. Nonischemic CAD. Unfortunately for many reasons mentioned elsewhere, not able to treat with standard CHF medications.  Currently requiring midodrine to allow for blood pressure for hemodialysis.  Not a candidate for advanced therapies as discussed elsewhere.      Relevant Orders   EKG 12-Lead (Completed)   Paroxysmal atrial fibrillation (HCC) (Chronic)     Currently no longer in A. fib.  He is on oral amiodarone for maintaining sinus rhythm.  Is also on warfarin.  At this point I think maintaining sinus rhythm with amiodarone is the best option.  If he were to have breakthrough RVR, would need to consider attempted chemical cardioversion with IV amiodarone boluses versus cardioversion to avoid exacerbation of his combined CHF      Relevant Orders   EKG 12-Lead (Completed)       I spent a total of 65 minutes with the patient and chart review. >  50% of the time was spent in direct patient consultation.  At least 35 to 40 minutes in direct patient contact.  The main to the time was spent in chart review reviewing the multiple hospitalizations and procedures.  Current medicines are reviewed at length with the patient today.  (+/- concerns) none The following changes have been made:  None  Patient Instructions  Medication Instructions:  No  Changes continue using midodrine as needed  If you need a refill on your cardiac medications before your next appointment, please call your pharmacy.   Lab work:  No changes If you have labs (blood work) drawn today and your tests are completely normal, you will receive your results only by: Marland Kitchen MyChart Message (if you have MyChart) OR . A paper copy in the mail If you have any lab test that is abnormal or we need to change your treatment, we will call you to review the results.  Testing/Procedures: Not needed  Follow-Up: At [facility], you and your health needs are our priority.  As part of our continuing mission to provide you with exceptional heart care, we have created designated Provider Care Teams.  These Care Teams include your primary Cardiologist (physician) and Advanced Practice Providers (APPs -  Physician Assistants and Nurse Practitioners) who all work together to provide you with the care you need, when you need it. You will need a follow up appointment in 6 to 8  weeks  jULY 10,2020  AT 1:40 PM.  Please call our office 2 months in advance to schedule this appointment.  You may see [name], MD or one of the following Advanced Practice Providers on your designated Care Team: [place], [place] . Fabian Sharp, PA-C  Any Other Special Instructions Will Be Listed Below (If Applicable).     TALK TO PALLATIVE CARE  Studies Ordered:   Orders Placed This Encounter  Procedures  . EKG 12-Lead      Glenetta Hew, M.D., M.S. Interventional Cardiologist   Pager # [phone] Phone # [phone] [address] [place], [place] [place] [id number]   Thank you for choosing Heartcare at [facility]!!

## 2019-04-01 NOTE — Telephone Encounter (Signed)
Ok thanks [place]!

## 2019-04-01 NOTE — Telephone Encounter (Signed)
[place] called back to Triage on Monday, asked for an appointment on Wednesday 4/29 due to dialysis/other appointments.

## 2019-04-02 ENCOUNTER — Encounter: Payer: Self-pay | Admitting: Cardiology

## 2019-04-02 ENCOUNTER — Other Ambulatory Visit: Payer: Self-pay

## 2019-04-02 ENCOUNTER — Ambulatory Visit (INDEPENDENT_AMBULATORY_CARE_PROVIDER_SITE_OTHER): Payer: Medicare HMO | Admitting: Infectious Disease

## 2019-04-02 ENCOUNTER — Ambulatory Visit (INDEPENDENT_AMBULATORY_CARE_PROVIDER_SITE_OTHER): Payer: Medicare HMO | Admitting: Cardiology

## 2019-04-02 ENCOUNTER — Encounter: Payer: Self-pay | Admitting: Infectious Disease

## 2019-04-02 VITALS — BP 142/68 | HR 72 | Ht 74.0 in | Wt 181.8 lb

## 2019-04-02 VITALS — BP 144/74 | HR 79 | Temp 97.4°F | Wt 181.1 lb

## 2019-04-02 DIAGNOSIS — A429 Actinomycosis, unspecified: Secondary | ICD-10-CM | POA: Insufficient documentation

## 2019-04-02 DIAGNOSIS — B20 Human immunodeficiency virus [HIV] disease: Secondary | ICD-10-CM | POA: Diagnosis not present

## 2019-04-02 DIAGNOSIS — I447 Left bundle-branch block, unspecified: Secondary | ICD-10-CM | POA: Diagnosis not present

## 2019-04-02 DIAGNOSIS — I420 Dilated cardiomyopathy: Secondary | ICD-10-CM

## 2019-04-02 DIAGNOSIS — I953 Hypotension of hemodialysis: Secondary | ICD-10-CM

## 2019-04-02 DIAGNOSIS — I480 Paroxysmal atrial fibrillation: Secondary | ICD-10-CM

## 2019-04-02 DIAGNOSIS — Z992 Dependence on renal dialysis: Secondary | ICD-10-CM

## 2019-04-02 DIAGNOSIS — I5042 Chronic combined systolic (congestive) and diastolic (congestive) heart failure: Secondary | ICD-10-CM

## 2019-04-02 DIAGNOSIS — A529 Late syphilis, unspecified: Secondary | ICD-10-CM | POA: Diagnosis not present

## 2019-04-02 DIAGNOSIS — Z7189 Other specified counseling: Secondary | ICD-10-CM

## 2019-04-02 DIAGNOSIS — N186 End stage renal disease: Secondary | ICD-10-CM

## 2019-04-02 HISTORY — DX: Actinomycosis, unspecified: A42.9

## 2019-04-02 MED ORDER — AMOXICILLIN 500 MG PO CAPS: ORAL_CAPSULE | ORAL | 5 refills | Status: DC

## 2019-04-02 NOTE — Progress Notes (Signed)
Chief complaint: followup for positive blood culture for actinomyces  Subjective:    Patient ID: David [name], male    DOB: 05-Oct-1941, 78 y.o.   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  Due to his comorbid cardiomyopathy and chronic kidney disease we had some concern about ABACAVIR and gentle risk for cardiovascular disease and therefore we changed him to [facility]. He has maintained perfect control on [name] but is complaining of nausea and also lightheadedness. There could because some confounding here given the fact that he was also on terazosin  BPH but he still is having nausea and lightheadedness despite having this medication held. He wanted to be changed back off of the [place] and we changed  him to [facility] and [place].  Now switched over to [place].  His HIV is been perfectly controlled but he has been suffering from other problems to his other comorbid conditions.  He was hospitalized in April abdominal pain and imaged with a CT scan of the pelvis abdomen pelvis that showed some inflammation in the cecum.  He was treated with broad-spectrum antibiotics.  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  With hospice and palliative care to that hospitalization but has remained on hemodialysis.  In the meantime he was readmitted in April 22 with respiratory failure thought to be due to volume overload which resolved with dialysis and optimization of his heart failure.  Blood cultures were drawn on admission when he did have leukocytosis as well he was treated with antibiotics while in-house but then these were discontinued.  His blood cultures have subsequently shown 1 out of 2 sites to grow and  actinomyces species.  He is currently afebrile without any significant symptoms.  I brought him back to clinic today due to my concerns about this positive blood culture.  On exam he does have fairly poor dentition but no obvious dental abscess.  He does not have a murmur or anything to give [place] a clear-cut suggestion of endocarditis.  That being said I have anxiety about this actinomyces representing a true pathogen and feel be prudent to err on the side of treating him with antibiotics.     Past Medical History:  Diagnosis Date  . 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 Acute on chronic systolic and diastolic heart failure, NYHA class 4 ([place])   . Anemia, iron deficiency 11/15/2011  . Arthritis    "hands, right knee, feet" (02/21/2018)  . Atrial fibrillation ([place])   . Cancer [facility])    hx of prostate; s/p radioactive seed implant 10/2009 Dr Janice Norrie  . Cardiac arrest ([place]) 02/17/2018  . CHF (congestive heart failure) ([place])   . Chronic combined systolic and diastolic heart failure, NYHA class 3 ([place]) 06/2010   felt to be secondary to hypertensive cardiomyopathy  . Chronic lower back pain   . CKD (chronic kidney disease) stage V requiring chronic dialysis ([place])   . ESRD on dialysis ([place])    started 02/2018  . GERD (gastroesophageal reflux disease)   . Glaucoma   . Gout    daily RX (02/21/2018)  . Heart murmur    "mild" per pt  . Hepatitis    years ago  .  HIV (human immunodeficiency virus infection) ([place])   . HIV infection ([place])   . Hyperlipidemia   . Hypertension    followed by [facility] and Vascular (Dr Dani Gobble Croitoru)  . Hypertension   . Nonischemic cardiomyopathy ([place])   . Pneumonia 11/2017  . Prostate cancer ([place])   . Sinus bradycardia   . Sleep apnea    does not use a cpap  . Stroke [facility])    "mini stroke" years ago    Past Surgical History:  Procedure Laterality Date  . AV FISTULA PLACEMENT Right 10/13/2016   Procedure: ARTERIOVENOUS (AV) FISTULA CREATION;   Surgeon: [name], MD;  Location: [place];  Service: Vascular;  Laterality: Right;  . AV FISTULA PLACEMENT Left 02/25/2018   Procedure: INSERTION OF ARTERIOVENOUS (AV) GORE-TEX GRAFT LEFT UPPER ARM;  Surgeon: Angelia Mould, MD;  Location: [place];  Service: Vascular;  Laterality: Left;  . AV FISTULA PLACEMENT Right 08/07/2018   Procedure: Creation of right arm brachiocephalic Fistula;  Surgeon: Waynetta Sandy, MD;  Location: [place];  Service: Vascular;  Laterality: Right;  . BASCILIC VEIN TRANSPOSITION Left 06/24/2015   Procedure: BASILIC VEIN TRANSPOSITION;  Surgeon: Mal Misty, MD;  Location: [place];  Service: Vascular;  Laterality: Left;  . BIOPSY  03/14/2019   Procedure: BIOPSY;  Surgeon: Irene Shipper, MD;  Location: [place];  Service: Endoscopy;;  . CATARACT EXTRACTION, BILATERAL Bilateral   . COLONOSCOPY WITH PROPOFOL N/A 03/14/2019   Procedure: COLONOSCOPY WITH PROPOFOL;  Surgeon: Irene Shipper, MD;  Location: [facility] ENDOSCOPY;  Service: Endoscopy;  Laterality: N/A;  . ESOPHAGOGASTRODUODENOSCOPY (EGD) WITH PROPOFOL N/A 05/05/2014   Procedure: ESOPHAGOGASTRODUODENOSCOPY (EGD) WITH PROPOFOL;  Surgeon: Missy Sabins, MD;  Location: [facility] ENDOSCOPY;  Service: Endoscopy;  Laterality: N/A;  . EYE SURGERY Bilateral    cataract surgery   . GLAUCOMA SURGERY Bilateral   . INSERTION OF ARTERIOVENOUS (AV) ARTEGRAFT ARM  10/09/2018   Procedure: INSERTION OF ARTERIOVENOUS (AV) 80mm x 41cm ARTEGRAFT LEFT UPPER ARM;  Surgeon: Waynetta Sandy, MD;  Location: [place];  Service: Vascular;;  . INSERTION PROSTATE RADIATION SEED    . IR FLUORO GUIDE CV LINE RIGHT  02/18/2018  . IR REMOVAL TUN CV CATH W/O FL  12/06/2018  . IR US GUIDE VASC ACCESS RIGHT  02/18/2018  . LEFT HEART CATH AND CORONARY ANGIOGRAPHY N/A 02/20/2018   Procedure: LEFT HEART CATH AND CORONARY ANGIOGRAPHY;  Surgeon: Jettie Booze, MD;  Location: [place] CV LAB;;; 50% ostial OM2, 25% mLAD.  Marland Kitchen LEFT HEART CATH AND  CORONARY ANGIOGRAPHY  2004   mildly depressed LV systolic fx EF 26%,RSWNIO coronaries/abdominal aorta/renal arteries.  Marland Kitchen NM MYOCAR PERF WALL MOTION  02/21/2010   No ischemia or infarction.  EF 27%.  Marland Kitchen RADIOACTIVE SEED IMPLANT  2010   prostate cancer  . THROMBECTOMY W/ EMBOLECTOMY Left 02/26/2018   Procedure: THROMBECTOMY ARTERIOVENOUS GRAFT;  Surgeon: Angelia Mould, MD;  Location: [place];  Service: Vascular;  Laterality: Left;  . TRANSTHORACIC ECHOCARDIOGRAM  02/2012   [facility]) EF 45-50% with mild global HK.  Mild LVH,LA mod. dilated,mild-mod. 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 AI.  Marland Kitchen TRANSTHORACIC ECHOCARDIOGRAM  9/'18; 3/'19   a) Severely reduced EF.  25 from 30%.  GR 1 DD with diffuse ecchymosis. Mild AS & AI.  Mild LA/RA dilation; b) (post PEA arest):   Moderately dilated LV with moderate concentric virtually.  EF 15 to 20%.  GR 1  DD.  Paradoxical septal motion. Mild AI.  Mild LA dilation.  Poorly visualized RV.  Marland Kitchen TRANSTHORACIC ECHOCARDIOGRAM  02/2019   EF 20-25%.  Unable to assess diastolic function (A. Fib).  No LV apical thrombus.  Mild AI.  Mildly reduced RV function, however poorly visualized.  Marland Kitchen ULTRASOUND GUIDANCE FOR VASCULAR ACCESS  02/20/2018   Procedure: Ultrasound Guidance For Vascular Access;  Surgeon: Jettie Booze, MD;  Location: [place] CV LAB;  Service: Cardiovascular;;  . UPPER EXTREMITY VENOGRAPHY N/A 07/08/2018   Procedure: UPPER EXTREMITY VENOGRAPHY;  Surgeon: Waynetta Sandy, MD;  Location: [place] CV LAB;  Service: Cardiovascular;  Laterality: N/A;  Bilateral    Family History  Problem Relation Age of Onset  . Hypertension Mother   . Thyroid disease Mother   . Cholelithiasis Daughter   . Cholelithiasis Son   . Hypertension Maternal Grandmother   . Diabetes Maternal Grandmother   . Heart attack Neg Hx   . Hyperlipidemia Neg Hx       Social History   Socioeconomic History  . Marital status:  Married    Spouse name: Not on file  . Number of children: 2  . Years of education: 61  . Highest education level: Not on file  Occupational History  . Occupation: retired, picks up Aeronautical engineer: RETIRED  Social Needs  . Financial resource strain: Not on file  . Food insecurity:    Worry: Not on file    Inability: Not on file  . Transportation needs:    Medical: Not on file    Non-medical: Not on file  Tobacco Use  . Smoking status: Former Smoker    Packs/day: 1.00    Years: 30.00    Pack years: 30.00    Types: Cigarettes    Last attempt to quit: 2006    Years since quitting: 14.3  . Smokeless tobacco: Never Used  Substance and Sexual Activity  . Alcohol use: Yes    Comment: once a week-wine  . Drug use: Never  . Sexual activity: Not Currently  Lifestyle  . Physical activity:    Days per week: Not on file    Minutes per session: Not on file  . Stress: Not on file  Relationships  . Social connections:    Talks on phone: Not on file    Gets together: Not on file    Attends religious service: Not on file    Active member of club or organization: Not on file    Attends meetings of clubs or organizations: Not on file    Relationship status: Not on file  Other Topics Concern  . Not on file  Social History Narrative   ** Merged History Encounter **       Stays active at home Regular exercise: no Drinks 2 cups of coffee a week, 1 mountain dew soda a day.    Allergies  Allergen Reactions  . Cozaar [Losartan Potassium] Other (See Comments)    Causes constipation      Current Outpatient Medications:  .  acetaminophen (TYLENOL) 500 MG tablet, Take 500-1,000 mg by mouth every 6 (six) hours as needed for mild pain. , Disp: , Rfl:  .  allopurinol (ZYLOPRIM) 100 MG tablet, TAKE 1 TABLET EVERY DAY (Patient taking differently: Take 100 mg by mouth daily. ), Disp: 90 tablet, Rfl: 1 .  amiodarone (PACERONE) 200 MG tablet, TAKE 1 TABLET(200 MG) BY MOUTH DAILY  (Patient taking differently: Take 200  mg by mouth daily. ), Disp: 90 tablet, Rfl: 1 .  AURYXIA 1 GM 210 MG(Fe) tablet, Take 420 mg by mouth 3 (three) times daily with meals. , Disp: , Rfl:  .  bictegravir-emtricitabine-tenofovir AF ([name]) 50-200-25 MG TABS tablet, Take 1 tablet by mouth daily., Disp: 30 tablet, Rfl: 11 .  brimonidine (ALPHAGAN) 0.15 % ophthalmic solution, Place 1 drop into the left eye 2 (two) times daily., Disp: , Rfl: 3 .  calcitRIOL (ROCALTROL) 0.5 MCG capsule, Take 1 capsule (0.5 mcg total) by mouth 3 (three) times a week., Disp: , Rfl:  .  dorzolamide-timolol (COSOPT) 22.3-6.8 MG/ML ophthalmic solution, Place 1 drop into the left eye 2 (two) times daily., Disp: , Rfl:  .  latanoprost (XALATAN) 0.005 % ophthalmic solution, Place 1 drop into the left eye at bedtime. , Disp: , Rfl:  .  loratadine (CLARITIN) 10 MG tablet, Take 1 tablet (10 mg total) by mouth daily., Disp: 30 tablet, Rfl: 11 .  midodrine (PROAMATINE) 10 MG tablet, Take 1 tablet (10 mg total) by mouth as directed. Twice a day on the day of HD Tuesday, Thursday, Saturday (1 in AM and 1 at [place]). Take 1/2 tablet (5mg ) a day on non HD days, Disp: 30 tablet, Rfl: 0 .  multivitamin (RENA-VIT) TABS tablet, Take 1 tablet by mouth daily., Disp: , Rfl:  .  warfarin (COUMADIN) 5 MG tablet, Take 2 tablets by mouth daily or as directed by coumadin clinic (Patient taking differently: Take 10 mg by mouth daily. ), Disp: 60 tablet, Rfl: 1   Review of Systems  Constitutional: Negative for activity change, appetite change, chills, fatigue, fever and unexpected weight change.  HENT: Negative for congestion, rhinorrhea, sinus pressure, sneezing, sore throat and trouble swallowing.   Respiratory: Positive for shortness of breath. Negative for cough.   Cardiovascular: Negative for chest pain, palpitations and leg swelling.  Gastrointestinal: Negative for abdominal distention, abdominal pain, constipation, nausea and vomiting.   Genitourinary: Negative for hematuria.  Musculoskeletal: Negative for myalgias.  Skin: Negative for color change, pallor, rash and wound.  Neurological: Negative for dizziness, tremors, weakness and light-headedness.  Hematological: Negative for adenopathy. Does not bruise/bleed easily.  Psychiatric/Behavioral: Negative for agitation, behavioral problems, confusion, decreased concentration and sleep disturbance.       Objective:   Physical Exam  Constitutional: He is oriented to person, place, and time. He appears well-developed and well-nourished. No distress.  HENT:  Head: Normocephalic and atraumatic.  Mouth/Throat: Oropharynx is clear and moist. Abnormal dentition. Dental caries present. No oropharyngeal exudate.  Eyes: Conjunctivae and EOM are normal. No scleral icterus.  Neck: Normal range of motion. Neck supple.  Cardiovascular: Normal rate, regular rhythm and normal heart sounds. Exam reveals no gallop and no friction rub.  No murmur heard. Pulmonary/Chest: Effort normal and breath sounds normal. No respiratory distress.  Abdominal: Soft. Bowel sounds are normal. He exhibits no distension. There is no abdominal tenderness. There is no rebound.  Musculoskeletal:        General: No tenderness or edema.  Neurological: He is alert and oriented to person, place, and time. He has normal reflexes. He exhibits normal muscle tone. Coordination normal.  Skin: Skin is warm and dry. He is not diaphoretic. No erythema. No pallor.  Psychiatric: His behavior is normal. Judgment and thought content normal. His mood appears anxious.          Assessment & Plan:   Positive blood culture for actinomyces:  Given his poor dentition which could  be a risk factor for having disseminated actinomyces infection I think it is reasonable to initiate him on antimicrobial therapy.  I reviewed his imaging personally including CT scan and chest x-rays.  Given the limitations and resources being  deployed during novel coronavirus 2019 pandemic were not going to be able to get a transesophageal echocardiogram on this gentleman to definitely exclude endocarditis.  He also be difficult to get imaging as an outpatient.  Therefore I am in a repeat blood cultures today and initiate him on amoxicillin oral therapy 1 tablet daily except to be taken twice daily with a second dose after hemodialysis on hemodialysis days I plan on treating him for at least 3 to 6 months.   HIV/AIDS: Due to his SPAP in January.  I have sent a prescription for [name] which he can switch over to once he is finished this month's supply of TIVICAY and DESCOVY  If he has symptoms like he had before we could I will switch him back but I suspect the symptoms have nothing to do with the [name]  HIV disease: Perfectly controlled, need to come back to renew his SPAP program in August. Syphilis:titers down  CKD: followed by Dr Posey Pronto now on hemodialysis  HTN:  better  controlled   Vitals:   04/02/19 0943  BP: (!) 144/74  Pulse: 79  Temp: (!) 97.4 F (36.3 C)     NICM: Cardiology following closely

## 2019-04-02 NOTE — Patient Instructions (Addendum)
Medication Instructions:  No  Changes continue using midodrine as needed  If you need a refill on your cardiac medications before your next appointment, please call your pharmacy.   Lab work:  No changes If you have labs (blood work) drawn today and your tests are completely normal, you will receive your results only by: Marland Kitchen MyChart Message (if you have MyChart) OR . A paper copy in the mail If you have any lab test that is abnormal or we need to change your treatment, we will call you to review the results.  Testing/Procedures: Not needed  Follow-Up: At [facility], you and your health needs are our priority.  As part of our continuing mission to provide you with exceptional heart care, we have created designated Provider Care Teams.  These Care Teams include your primary Cardiologist (physician) and Advanced Practice Providers (APPs -  Physician Assistants and Nurse Practitioners) who all work together to provide you with the care you need, when you need it. You will need a follow up appointment in 6 to 8  weeks  jULY 10,2020 AT 1:40 PM.  Please call our office 2 months in advance to schedule this appointment.  You may see [name], MD or one of the following Advanced Practice Providers on your designated Care Team: [place], [place] . Fabian Sharp, PA-C  Any Other Special Instructions Will Be Listed Below (If Applicable).     TALK TO PALLATIVE CARE

## 2019-04-02 NOTE — Progress Notes (Signed)
Dank u! Dolores Hoose, MD

## 2019-04-03 ENCOUNTER — Encounter: Payer: Self-pay | Admitting: Cardiology

## 2019-04-03 ENCOUNTER — Encounter: Payer: Self-pay | Admitting: Nephrology

## 2019-04-03 DIAGNOSIS — Z992 Dependence on renal dialysis: Secondary | ICD-10-CM | POA: Diagnosis not present

## 2019-04-03 DIAGNOSIS — J810 Acute pulmonary edema: Secondary | ICD-10-CM | POA: Diagnosis not present

## 2019-04-03 DIAGNOSIS — B20 Human immunodeficiency virus [HIV] disease: Secondary | ICD-10-CM | POA: Diagnosis not present

## 2019-04-03 DIAGNOSIS — N186 End stage renal disease: Secondary | ICD-10-CM | POA: Diagnosis not present

## 2019-04-03 DIAGNOSIS — N2581 Secondary hyperparathyroidism of renal origin: Secondary | ICD-10-CM | POA: Diagnosis not present

## 2019-04-03 NOTE — Assessment & Plan Note (Signed)
Currently no longer in A. fib.  He is on oral amiodarone for maintaining sinus rhythm.  Is also on warfarin.  At this point I think maintaining sinus rhythm with amiodarone is the best option.  If he were to have breakthrough RVR, would need to consider attempted chemical cardioversion with IV amiodarone boluses versus cardioversion to avoid exacerbation of his combined CHF

## 2019-04-03 NOTE — Progress Notes (Signed)
Are you seeing all my patients?????

## 2019-04-03 NOTE — Assessment & Plan Note (Signed)
This is, the rate limiting step for the patient and that he is dependent on dialysis volume removal, but has low blood pressure during dialysis requiring Midrin to allow for blood pressure prior to dialysis.  Between [name] and his nephrologist, they have set up a regimen for midodrine, however he is still had admissions for CHF. Currently now the plan is less volume removal per dialysis, but now having for dialysis days per week as opposed to 3.  Hopefully this will help with reducing the chance of hypotension and also avoiding the prolonged weekend delay for volume removal.  Need to ensure that his dry weights are stable.

## 2019-04-03 NOTE — Progress Notes (Signed)
Thank you for seeing David [name]. [place]. Also growing some Actinomyces on blood culture, hopefully a contaminant. [name] dam treating it [employer]

## 2019-04-03 NOTE — Assessment & Plan Note (Signed)
The majority of time for this visit after assessing the patient's cardiac and other symptoms and reviewing his hospitalizations was dedicated to try to explain to him his cardiomyopathy, heart failure and atrial fibrillation and how this is complicated by his dialysis and troubles with blood pressure during dialysis. I explained to him that I think we are nearing end-stage as far as his renal disease and requiring 4 days a week dialysis and recurrent hospitalizations for CHF as well as hypoperfusion.  Limited options for treatment.  I do strongly recommend that he discuss goals of care with the palliative care team.  I explained to him that we are unable to offer anything further from a heart failure standpoint and that at this point in time we are limited to volume removal from dialysis and attempt to maintain his blood pressures.  Would recommend that there be strong discussions with his primary physicians (PCP, cardiologist and nephrologist as well as the palliative care team) as to determining his CODE STATUS and final goals of care.  I do not think that starting discussions about possible hospice are reasonable.

## 2019-04-03 NOTE — Assessment & Plan Note (Signed)
EF has ranged anywhere from 15 to 25% of late.  Thought to be related to hypertension, however cannot exclude component of HIV disease and left bundle branch block. Nonischemic CAD. Unfortunately for many reasons mentioned elsewhere, not able to treat with standard CHF medications.  Currently requiring midodrine to allow for blood pressure for hemodialysis.  Not a candidate for advanced therapies as discussed elsewhere.

## 2019-04-03 NOTE — Assessment & Plan Note (Signed)
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  Relatively poor prognosis with class III and almost 4 symptoms.  Hypoperfusion during dialysis and is essentially dialysis dependent for volume management.  As per Dr. Dani Gobble Croitoru's comments, David [name] would not be a candidate for CRT, LVAD or transplant as well as any potential advanced care options for his CHF with his comorbidities.  At this time, I explained to him [name] and myself both believe that we are extremely limited to what we can provide for him from a cardiac standpoint.  I did say that if he has lowish blood pressures on off days, would be okay to take additional midodrine.  Otherwise, we are not able to use the classic cardiac medications such as beta-blocker, ARB/Entresto or spironolactone given lack of blood pressure and inability control volume status without dialysis.

## 2019-04-03 NOTE — Assessment & Plan Note (Signed)
Chronic finding.  Classically this would make him a candidate for CRT, but however in a dialysis patient he would not be a candidate.

## 2019-04-04 ENCOUNTER — Other Ambulatory Visit: Payer: Self-pay

## 2019-04-04 ENCOUNTER — Ambulatory Visit: Payer: Medicare HMO | Admitting: Family

## 2019-04-04 NOTE — Progress Notes (Signed)
Attempted to call patient several times. Patient does not answer.  No charge.

## 2019-04-04 NOTE — Progress Notes (Signed)
  I attempted to reach patient again on 5/1 at 4:35 pm and he did not answer.

## 2019-04-05 DIAGNOSIS — N186 End stage renal disease: Secondary | ICD-10-CM | POA: Diagnosis not present

## 2019-04-05 DIAGNOSIS — N2581 Secondary hyperparathyroidism of renal origin: Secondary | ICD-10-CM | POA: Diagnosis not present

## 2019-04-05 DIAGNOSIS — J810 Acute pulmonary edema: Secondary | ICD-10-CM | POA: Diagnosis not present

## 2019-04-07 DIAGNOSIS — N2581 Secondary hyperparathyroidism of renal origin: Secondary | ICD-10-CM | POA: Diagnosis not present

## 2019-04-07 DIAGNOSIS — J810 Acute pulmonary edema: Secondary | ICD-10-CM | POA: Diagnosis not present

## 2019-04-07 DIAGNOSIS — N186 End stage renal disease: Secondary | ICD-10-CM | POA: Diagnosis not present

## 2019-04-08 DIAGNOSIS — N186 End stage renal disease: Secondary | ICD-10-CM | POA: Diagnosis not present

## 2019-04-08 DIAGNOSIS — N2581 Secondary hyperparathyroidism of renal origin: Secondary | ICD-10-CM | POA: Diagnosis not present

## 2019-04-08 DIAGNOSIS — J810 Acute pulmonary edema: Secondary | ICD-10-CM | POA: Diagnosis not present

## 2019-04-08 LAB — CULTURE, BLOOD (SINGLE)
MICRO NUMBER:: 435272
MICRO NUMBER:: 435273
Result:: NO GROWTH
SPECIMEN QUALITY:: ADEQUATE

## 2019-04-10 DIAGNOSIS — J810 Acute pulmonary edema: Secondary | ICD-10-CM | POA: Diagnosis not present

## 2019-04-10 DIAGNOSIS — N2581 Secondary hyperparathyroidism of renal origin: Secondary | ICD-10-CM | POA: Diagnosis not present

## 2019-04-10 DIAGNOSIS — N186 End stage renal disease: Secondary | ICD-10-CM | POA: Diagnosis not present

## 2019-04-12 ENCOUNTER — Emergency Department (HOSPITAL_COMMUNITY): Payer: Medicare HMO

## 2019-04-12 ENCOUNTER — Other Ambulatory Visit: Payer: Self-pay

## 2019-04-12 ENCOUNTER — Emergency Department (HOSPITAL_COMMUNITY)
Admission: EM | Admit: 2019-04-12 | Discharge: 2019-04-12 | Disposition: A | Discharge status: Home / Self Care | Payer: Medicare HMO | Attending: Emergency Medicine | Admitting: Emergency Medicine

## 2019-04-12 DIAGNOSIS — Z79899 Other long term (current) drug therapy: Secondary | ICD-10-CM | POA: Diagnosis not present

## 2019-04-12 DIAGNOSIS — R0902 Hypoxemia: Secondary | ICD-10-CM | POA: Diagnosis not present

## 2019-04-12 DIAGNOSIS — Z20828 Contact with and (suspected) exposure to other viral communicable diseases: Secondary | ICD-10-CM | POA: Insufficient documentation

## 2019-04-12 DIAGNOSIS — I5042 Chronic combined systolic (congestive) and diastolic (congestive) heart failure: Secondary | ICD-10-CM | POA: Insufficient documentation

## 2019-04-12 DIAGNOSIS — J810 Acute pulmonary edema: Secondary | ICD-10-CM | POA: Diagnosis not present

## 2019-04-12 DIAGNOSIS — Z992 Dependence on renal dialysis: Secondary | ICD-10-CM | POA: Diagnosis not present

## 2019-04-12 DIAGNOSIS — E875 Hyperkalemia: Secondary | ICD-10-CM | POA: Diagnosis not present

## 2019-04-12 DIAGNOSIS — M2550 Pain in unspecified joint: Secondary | ICD-10-CM | POA: Diagnosis not present

## 2019-04-12 DIAGNOSIS — R0602 Shortness of breath: Secondary | ICD-10-CM | POA: Insufficient documentation

## 2019-04-12 DIAGNOSIS — D539 Nutritional anemia, unspecified: Secondary | ICD-10-CM | POA: Insufficient documentation

## 2019-04-12 DIAGNOSIS — Z7901 Long term (current) use of anticoagulants: Secondary | ICD-10-CM | POA: Insufficient documentation

## 2019-04-12 DIAGNOSIS — I132 Hypertensive heart and chronic kidney disease with heart failure and with stage 5 chronic kidney disease, or end stage renal disease: Secondary | ICD-10-CM | POA: Diagnosis not present

## 2019-04-12 DIAGNOSIS — Z7401 Bed confinement status: Secondary | ICD-10-CM | POA: Diagnosis not present

## 2019-04-12 DIAGNOSIS — N186 End stage renal disease: Secondary | ICD-10-CM | POA: Insufficient documentation

## 2019-04-12 DIAGNOSIS — Z87891 Personal history of nicotine dependence: Secondary | ICD-10-CM | POA: Insufficient documentation

## 2019-04-12 DIAGNOSIS — D649 Anemia, unspecified: Secondary | ICD-10-CM | POA: Diagnosis not present

## 2019-04-12 DIAGNOSIS — I10 Essential (primary) hypertension: Secondary | ICD-10-CM | POA: Diagnosis not present

## 2019-04-12 DIAGNOSIS — Z209 Contact with and (suspected) exposure to unspecified communicable disease: Secondary | ICD-10-CM | POA: Diagnosis not present

## 2019-04-12 DIAGNOSIS — N2581 Secondary hyperparathyroidism of renal origin: Secondary | ICD-10-CM | POA: Diagnosis not present

## 2019-04-12 DIAGNOSIS — I120 Hypertensive chronic kidney disease with stage 5 chronic kidney disease or end stage renal disease: Secondary | ICD-10-CM | POA: Diagnosis not present

## 2019-04-12 DIAGNOSIS — B20 Human immunodeficiency virus [HIV] disease: Secondary | ICD-10-CM | POA: Insufficient documentation

## 2019-04-12 LAB — BASIC METABOLIC PANEL
Anion gap: 19 — ABNORMAL HIGH (ref 5–15)
BUN: 63 mg/dL — ABNORMAL HIGH (ref 8–23)
CO2: 25 mmol/L (ref 22–32)
Calcium: 9 mg/dL (ref 8.9–10.3)
Chloride: 94 mmol/L — ABNORMAL LOW (ref 98–111)
Creatinine, Ser: 12.48 mg/dL — ABNORMAL HIGH (ref 0.61–1.24)
GFR calc Af Amer: 4 mL/min — ABNORMAL LOW (ref >60)
GFR calc non Af Amer: 3 mL/min — ABNORMAL LOW (ref >60)
Glucose, Bld: 113 mg/dL — ABNORMAL HIGH (ref 70–99)
Potassium: 5.5 mmol/L — ABNORMAL HIGH (ref 3.5–5.1)
Sodium: 138 mmol/L (ref 135–145)

## 2019-04-12 LAB — CBC WITH DIFFERENTIAL/PLATELET
Abs Immature Granulocytes: 0.03 10*3/uL (ref 0.00–0.07)
Basophils Absolute: 0.1 10*3/uL (ref 0.0–0.1)
Basophils Relative: 1 %
Eosinophils Absolute: 0.2 10*3/uL (ref 0.0–0.5)
Eosinophils Relative: 2 %
HCT: 33.8 % — ABNORMAL LOW (ref 39.0–52.0)
Hemoglobin: 10.9 g/dL — ABNORMAL LOW (ref 13.0–17.0)
Immature Granulocytes: 0 %
Lymphocytes Relative: 15 %
Lymphs Abs: 1.3 10*3/uL (ref 0.7–4.0)
MCH: 34.7 pg — ABNORMAL HIGH (ref 26.0–34.0)
MCHC: 32.2 g/dL (ref 30.0–36.0)
MCV: 107.6 fL — ABNORMAL HIGH (ref 80.0–100.0)
Monocytes Absolute: 0.7 10*3/uL (ref 0.1–1.0)
Monocytes Relative: 8 %
Neutro Abs: 6 10*3/uL (ref 1.7–7.7)
Neutrophils Relative %: 74 %
Platelets: 237 10*3/uL (ref 150–400)
RBC: 3.14 MIL/uL — ABNORMAL LOW (ref 4.22–5.81)
RDW: 16.1 % — ABNORMAL HIGH (ref 11.5–15.5)
WBC: 8.2 10*3/uL (ref 4.0–10.5)
nRBC: 0 % (ref 0.0–0.2)

## 2019-04-12 LAB — SARS CORONAVIRUS 2 BY RT PCR (HOSPITAL ORDER, PERFORMED IN ~~LOC~~ HOSPITAL LAB): SARS Coronavirus 2: NEGATIVE

## 2019-04-12 IMAGING — DX DG CHEST 1V PORT
1 series · 1 of 1 positions shown · non-contrast
Comparison: Chest x-ray and chest CT scan February 17, 2018

CLINICAL DATA: Acute respiratory failure, hypoxia, intubated
patient. History of CHF, renal insufficiency, HIV, and prostate
malignancy.

EXAM:
PORTABLE CHEST 1 VIEW

[chest ap]
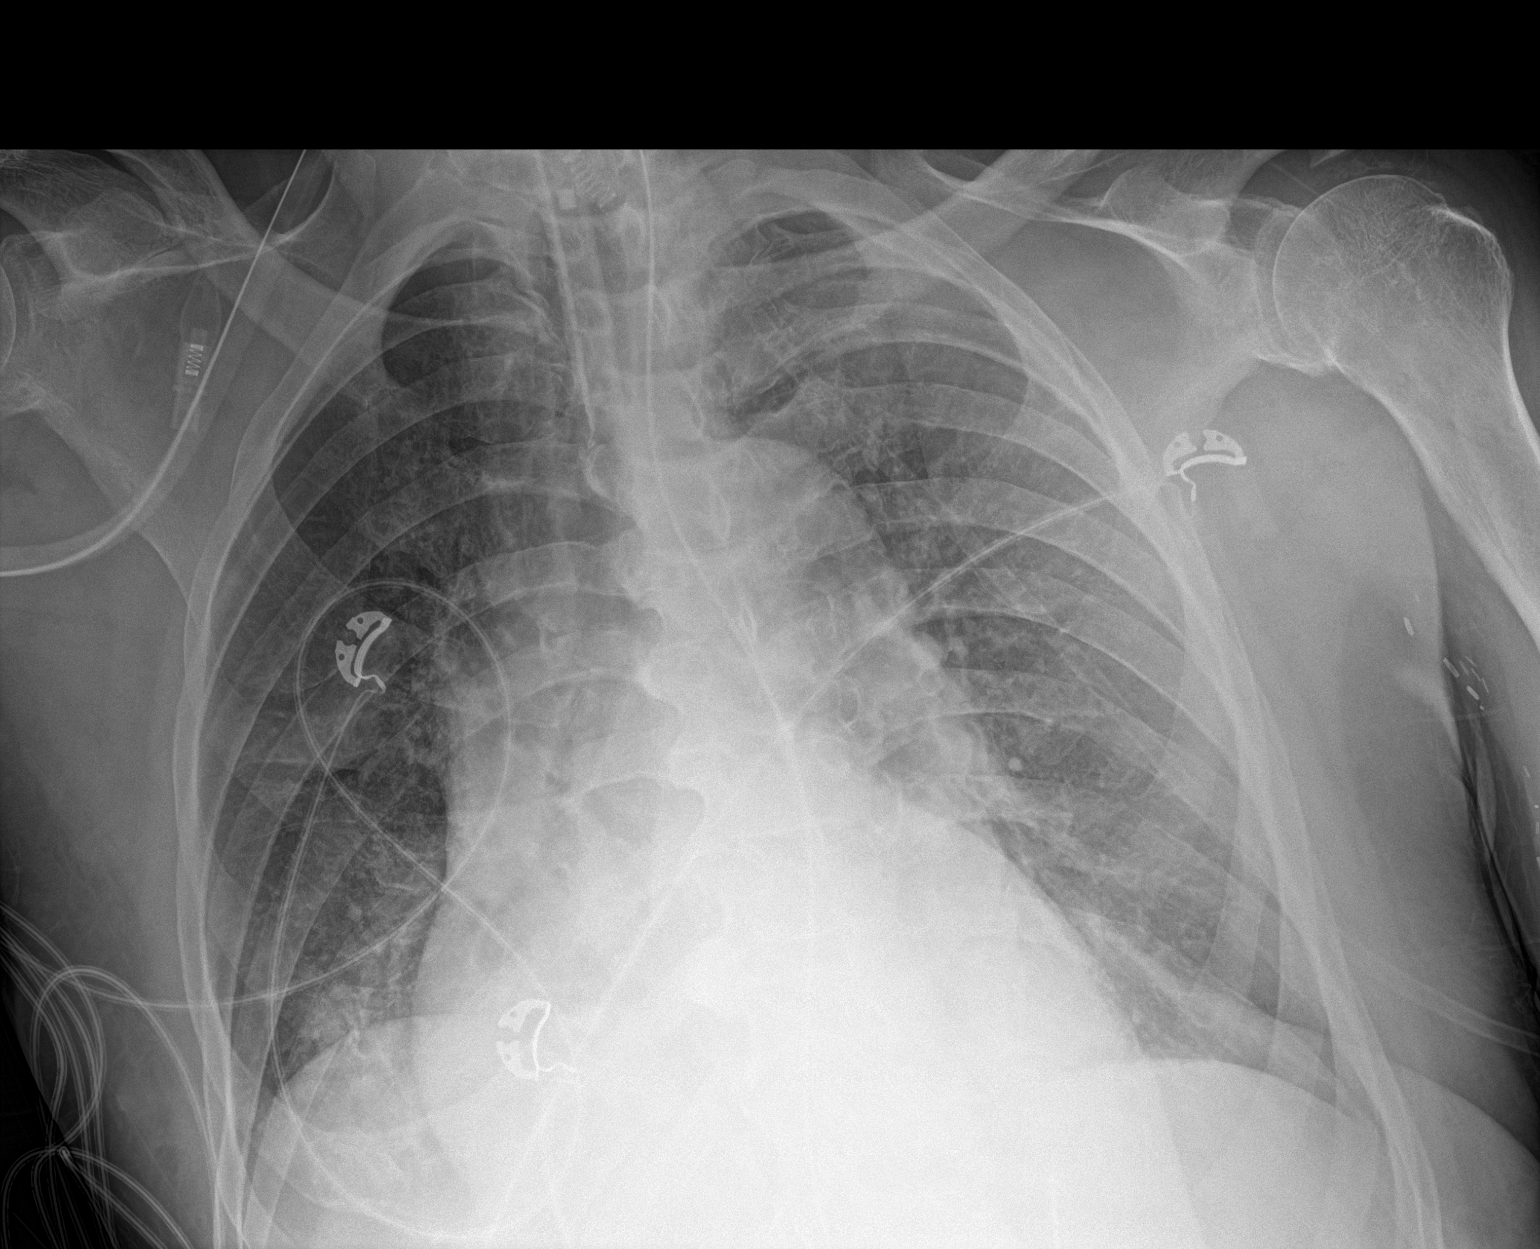

[1 of 1 positions shown; findings below may reference images not displayed]

FINDINGS: The lungs are adequately inflated. The interstitial markings remain
increased and are more conspicuous today on the left. The cardiac
silhouette remains enlarged. The pulmonary vascularity is not
engorged. The endotracheal tube tip lies 6.4 cm above the carina.
The esophagogastric tube tip and proximal port project below the GE
junction.
IMPRESSION: Persistent bilateral pneumonia. Slight increased opacity noted on
the left today. No overt pulmonary edema. The support tubes are in
reasonable position where visualized.

## 2019-04-12 IMAGING — US IR US GUIDE VASC ACCESS RIGHT
1 series · 1 of 1 positions shown · non-contrast
Comparison: None

CLINICAL DATA: End-stage renal disease, needs durable venous access
for hemodialysis

EXAM:
TUNNELED HEMODIALYSIS CATHETER PLACEMENT WITH ULTRASOUND AND
FLUOROSCOPIC GUIDANCE
TECHNIQUE: The procedure, risks, benefits, and alternatives were explained to
the spouse. Questions regarding the procedure were encouraged and
answered. The spouse understands and consents to the procedure.

[Series 1: ir us guide vasc access right · 1 of 1 slices shown]
[im 1/1]
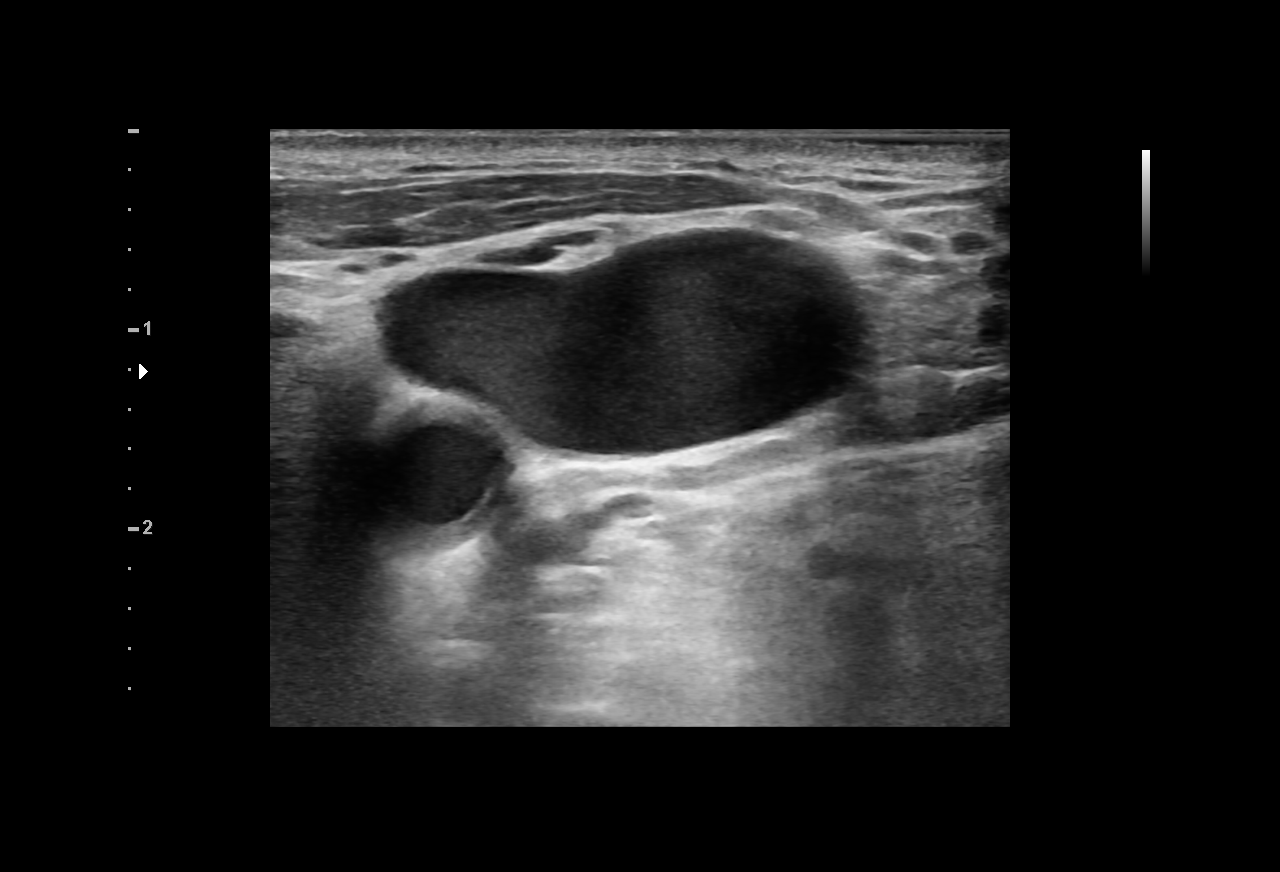

[1 of 1 positions shown; findings below may reference images not displayed]

As antibiotic prophylaxis, cefazolin 2 g was ordered pre-procedure
and administered intravenously within one hour of incision.Patency
of the right IJ vein was confirmed with ultrasound with image
documentation. An appropriate skin site was determined. Region was
prepped using maximum barrier technique including cap and mask,
sterile gown, sterile gloves, large sterile sheet, and Chlorhexidine
as cutaneous antisepsis. The region was infiltrated locally with 1%
lidocaine.

Intravenous Fentanyl and Versed were administered as conscious
sedation during continuous monitoring of the patient's level of
consciousness and physiological / cardiorespiratory status by the
radiology RN, with a total moderate sedation time of 10 minutes.
Under real-time ultrasound guidance, the right IJ vein was accessed
with a 21 gauge micropuncture needle; the needle tip within the vein
was confirmed with ultrasound image documentation. Needle exchanged
over the 018 guidewire for transitional dilator, which allowed
advancement of a Benson wire into the IVC. Over this, an MPA
catheter was advanced. A Palindrome 23 hemodialysis catheter was
tunneled from the right anterior chest wall approach to the right IJ
dermatotomy site. The MPA catheter was exchanged over an Amplatz
wire for serial vascular dilators which allow placement of a
peel-away sheath, through which the catheter was advanced under
intermittent fluoroscopy, positioned with its tips in the proximal
and midright atrium. Spot chest radiograph confirms good catheter
position. No pneumothorax. Catheter was flushed and primed per
protocol. Catheter secured externally with O Prolene sutures. The
right IJ dermatotomy site was closed with Dermabond.

COMPLICATIONS:
COMPLICATIONS
None immediate

FLUOROSCOPY TIME:  1 minutes; 9 mGy
IMPRESSION: 1. Technically successful placement of tunneled right IJ
hemodialysis catheter with ultrasound and fluoroscopic guidance.
Ready for routine use.

ACCESS:
Remains approachable for percutaneous intervention as needed.

## 2019-04-12 NOTE — ED Notes (Signed)
Placed pt on 5L [place]

## 2019-04-12 NOTE — ED Provider Notes (Signed)
[facility] EMERGENCY DEPARTMENT Provider Note   CSN: [id number] Arrival date & time: 04/12/19  0130    History   Chief Complaint Chief Complaint  Patient presents with   Shortness of Breath    HPI David [name] is a 78 y.o. male.   The history is provided by the patient.  He has past history of hypertension, hyperlipidemia, end-stage renal disease on hemodialysis, combined systolic and diastolic heart failure, glaucoma, paroxysmal atrial fibrillation, HIV infection and comes in because of difficulty breathing tonight.  He gets dialysis Tuesday-Thursday-Saturday, and was unable to be dialyzed yesterday because of difficulty cannulating his fistula.  He noted difficulty breathing tonight.  He denies chest pain and denies cough.  Denies any exposure to anyone with COVID-19.  In the past, he has needed ventilatory support with BiPAP.  EMS noted oxygen saturation of 90% on room air, improved to 100% with nonrebreather mask.  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   Acute on chronic systolic and diastolic heart failure, NYHA class 4 (HCC)    Anemia, iron deficiency 11/15/2011   Arthritis    "hands, right knee, feet" (02/21/2018)   Atrial fibrillation (HCC)    Cancer (HCC)    hx of prostate; s/p radioactive seed implant 10/2009 Dr Janice Norrie   Cardiac arrest [name]) 02/17/2018   CHF (congestive heart failure) (HCC)    Chronic combined systolic and diastolic heart failure, NYHA class 3 ([place]) 06/2010   felt to be secondary to hypertensive cardiomyopathy   Chronic lower back pain    CKD (chronic kidney disease) stage V requiring chronic dialysis ([place])    ESRD on dialysis ([place])    started 02/2018   GERD (gastroesophageal reflux disease)    Glaucoma    Gout    daily RX (02/21/2018)   Heart murmur    "mild" per pt   Hepatitis    years ago   HIV (human immunodeficiency virus infection) ([place])    HIV infection ([place])      Hyperlipidemia    Hypertension    followed by [facility] (Dr Dani Gobble Croitoru)   Hypertension    Nonischemic cardiomyopathy ([place])    Pneumonia 11/2017   Prostate cancer ([place])    Sinus bradycardia    Sleep apnea    does not use a cpap   Stroke ([place])    "mini stroke" years ago    Patient Active Problem List   Diagnosis Date Noted   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   Mesenteric ischemia (HCC)    Colonic ulcer    Right lower quadrant pain    Abnormal CT of the abdomen    Ischemic colitis ([place])    Colitis presumed infectious 03/09/2019   Acute respiratory failure with hypoxia and hypercapnia ([place]) 03/04/2019   Acute pulmonary edema (HCC) 02/11/2019   Atrial fibrillation with RVR ([place]) 12/05/2018   Paroxysmal atrial fibrillation ([place]) 11/16/2018   LBBB (left bundle branch block) 11/16/2018   Encounter for monitoring amiodarone therapy 11/16/2018   Volume overload 11/01/2018   Pulmonary edema 10/21/2018   Gout 10/21/2018   Hypertensive emergency 10/21/2018   Chronic combined systolic and diastolic heart failure, NYHA class 3 ([place]) 10/21/2018   Leukocytosis 10/21/2018   Gastroesophageal reflux disease 08/28/2018   Chronic anticoagulation 04/03/2018   CAD (coronary artery disease) 04/03/2018   Palliative care encounter 03/27/2018   Atrial fibrillation, chronic 03/25/2018   Palliative care by specialist    Advance care planning  Goals of care, counseling/discussion    Respiratory arrest ([place])    Acute on chronic systolic heart failure (HCC)    Chronic obstructive pulmonary disease ([place])    HIV (human immunodeficiency virus infection) ([place])    ESRD (end stage renal disease) ([place])    Acute respiratory failure with hypoxia ([place]) 01/21/2018   Pneumonia    Aspiration pneumonia of both lower lobes due to regurgitated food ([place])    Sepsis due to pneumonia ([place]) 11/22/2017   AIDS ([place]) 05/14/2014   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   Gastroparesis 05/06/2014   Protein-calorie malnutrition, severe ([place]) 05/05/2014   ESRD on hemodialysis ([place]) 05/02/2014   Hemodialysis-associated hypotension 05/02/2014   Loss of weight 04/29/2014   Conjunctivitis 04/29/2014   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   Low back pain 04/09/2012   Osteoarthritis of hip 12/29/2011   Iron deficiency anemia 06/28/2011   Macrocytic anemia 04/02/2011   ADENOCARCINOMA, PROSTATE, 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   Hyperlipidemia 11/30/2010   Transient cerebral ischemia 11/30/2010   HYPERGLYCEMIA 11/30/2010    Past Surgical History:  Procedure Laterality Date   AV FISTULA PLACEMENT Right 10/13/2016   Procedure: ARTERIOVENOUS (AV) FISTULA CREATION;  Surgeon: [name], MD;  Location: [place] [facility];  Service: Vascular;  Laterality: Right;   AV FISTULA PLACEMENT Left 02/25/2018   Procedure: INSERTION OF ARTERIOVENOUS (AV) GORE-TEX GRAFT LEFT UPPER ARM;  Surgeon: Angelia Mould, MD;  Location: [place];  Service: Vascular;  Laterality: Left;   AV FISTULA PLACEMENT Right 08/07/2018   Procedure: Creation of right arm brachiocephalic Fistula;  Surgeon: Waynetta Sandy, MD;  Location: [place];  Service: Vascular;  Laterality: Right;   [place] Left 06/24/2015   Procedure: BASILIC VEIN TRANSPOSITION;  Surgeon: Mal Misty, MD;  Location: [place];  Service: Vascular;  Laterality: Left;   BIOPSY  03/14/2019   Procedure: BIOPSY;  Surgeon: Irene Shipper, MD;  Location: [facility] ENDOSCOPY;  Service: Endoscopy;;   CATARACT EXTRACTION, BILATERAL Bilateral    COLONOSCOPY WITH PROPOFOL N/A 03/14/2019   Procedure: COLONOSCOPY WITH PROPOFOL;  Surgeon: Irene Shipper, MD;  Location: [place];  Service: Endoscopy;  Laterality: N/A;   ESOPHAGOGASTRODUODENOSCOPY (EGD) WITH PROPOFOL N/A 05/05/2014   Procedure: ESOPHAGOGASTRODUODENOSCOPY (EGD) WITH PROPOFOL;  Surgeon: Missy Sabins, MD;  Location: [facility] ENDOSCOPY;   Service: Endoscopy;  Laterality: N/A;   EYE SURGERY Bilateral    cataract surgery    GLAUCOMA SURGERY Bilateral    INSERTION OF ARTERIOVENOUS (AV) ARTEGRAFT ARM  10/09/2018   Procedure: INSERTION OF ARTERIOVENOUS (AV) 55mm x 41cm ARTEGRAFT LEFT UPPER ARM;  Surgeon: Waynetta Sandy, MD;  Location: [place];  Service: Vascular;;   INSERTION PROSTATE RADIATION SEED     IR FLUORO GUIDE CV LINE RIGHT  02/18/2018   IR REMOVAL TUN CV CATH W/O FL  12/06/2018   IR US GUIDE VASC ACCESS RIGHT  02/18/2018   LEFT HEART CATH AND CORONARY ANGIOGRAPHY N/A 02/20/2018   Procedure: LEFT HEART CATH AND CORONARY ANGIOGRAPHY;  Surgeon: Jettie Booze, MD;  Location: [place] CV LAB;;; 50% ostial OM2, 25% mLAD.   LEFT HEART CATH AND CORONARY ANGIOGRAPHY  2004   mildly depressed LV systolic fx EF 19%,JKDTOI coronaries/abdominal aorta/renal arteries.   NM MYOCAR PERF WALL MOTION  02/21/2010   No ischemia or infarction.  EF 27%.   RADIOACTIVE SEED IMPLANT  2010   prostate cancer   THROMBECTOMY W/ EMBOLECTOMY Left 02/26/2018   Procedure: THROMBECTOMY ARTERIOVENOUS GRAFT;  Surgeon: Scot Dock,  Judeth Cornfield, MD;  Location: [place];  Service: Vascular;  Laterality: Left;   TRANSTHORACIC ECHOCARDIOGRAM  02/2012   [facility]) EF 45-50% with mild global HK.  Mild LVH,LA mod. dilated,mild-mod. 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 AI.   TRANSTHORACIC ECHOCARDIOGRAM  9/'18; 3/'19   a) Severely reduced EF.  25 from 30%.  GR 1 DD with diffuse ecchymosis. Mild AS & AI.  Mild LA/RA dilation; b) (post PEA arest):   Moderately dilated LV with moderate concentric virtually.  EF 15 to 20%.  GR 1 DD.  Paradoxical septal motion. Mild AI.  Mild LA dilation.  Poorly visualized RV.   TRANSTHORACIC ECHOCARDIOGRAM  02/2019   EF 20-25%.  Unable to assess diastolic function (A. Fib).  No LV apical thrombus.  Mild AI.  Mildly reduced RV function, however poorly visualized.   ULTRASOUND GUIDANCE FOR  VASCULAR ACCESS  02/20/2018   Procedure: Ultrasound Guidance For Vascular Access;  Surgeon: Jettie Booze, MD;  Location: [place] CV LAB;  Service: Cardiovascular;;   UPPER EXTREMITY VENOGRAPHY N/A 07/08/2018   Procedure: UPPER EXTREMITY VENOGRAPHY;  Surgeon: Waynetta Sandy, MD;  Location: [place] CV LAB;  Service: Cardiovascular;  Laterality: N/A;  Bilateral        Home Medications    Prior to Admission medications   Medication Sig Start Date End Date Taking? Authorizing Provider  acetaminophen (TYLENOL) 500 MG tablet Take 500-1,000 mg by mouth every 6 (six) hours as needed for mild pain.     [provider]  allopurinol (ZYLOPRIM) 100 MG tablet TAKE 1 TABLET EVERY DAY Patient taking differently: Take 100 mg by mouth daily.  12/23/18   Croitoru, Mihai, MD  amiodarone (PACERONE) 200 MG tablet TAKE 1 TABLET(200 MG) BY MOUTH DAILY Patient taking differently: Take 200 mg by mouth daily.  10/29/18   Croitoru, Mihai, MD  amoxicillin (AMOXIL) 500 MG capsule Take QDAILY in the am, then on dialysis days take ANOTHER dose AFTER HD 04/02/19   Tommy Medal, Lavell Islam, MD  AURYXIA 1 GM 210 MG(Fe) tablet Take 420 mg by mouth 3 (three) times daily with meals.  08/05/18   [provider]  bictegravir-emtricitabine-tenofovir AF ([name]) 50-200-25 MG TABS tablet Take 1 tablet by mouth daily. 01/01/19   Truman Hayward, MD  brimonidine (ALPHAGAN) 0.15 % ophthalmic solution Place 1 drop into the left eye 2 (two) times daily. 11/15/17   [provider]  calcitRIOL (ROCALTROL) 0.5 MCG capsule Take 1 capsule (0.5 mcg total) by mouth 3 (three) times a week. 03/27/19   Hosie Poisson, MD  dorzolamide-timolol (COSOPT) 22.3-6.8 MG/ML ophthalmic solution Place 1 drop into the left eye 2 (two) times daily. 12/17/17   [provider]  latanoprost (XALATAN) 0.005 % ophthalmic solution Place 1 drop into the left eye at bedtime.     [provider]    loratadine (CLARITIN) 10 MG tablet Take 1 tablet (10 mg total) by mouth daily. 02/21/19   [name], NP  midodrine (PROAMATINE) 10 MG tablet Take 1 tablet (10 mg total) by mouth as directed. Twice a day on the day of HD Tuesday, Thursday, Saturday (1 in AM and 1 at [place]). Take 1/2 tablet (5mg ) a day on non HD days 03/06/19   Shearon Stalls, Rahul P, PA-C  multivitamin (RENA-VIT) TABS tablet Take 1 tablet by mouth daily.    [provider]  warfarin (COUMADIN) 5 MG tablet Take 2 tablets by mouth daily or as directed by  coumadin clinic Patient taking differently: Take 10 mg by mouth daily.  01/30/19   Croitoru, Mihai, MD    Family History Family History  Problem Relation Age of Onset   Hypertension Mother    Thyroid disease Mother    Cholelithiasis Daughter    Cholelithiasis Son    Hypertension Maternal Grandmother    Diabetes Maternal Grandmother    Heart attack Neg Hx    Hyperlipidemia Neg Hx     Social History Social History   Tobacco Use   Smoking status: Former Smoker    Packs/day: 1.00    Years: 30.00    Pack years: 30.00    Types: Cigarettes    Last attempt to quit: 2006    Years since quitting: 14.3   Smokeless tobacco: Never Used  Substance Use Topics   Alcohol use: Yes    Comment: once a week-wine   Drug use: Never     Allergies   Cozaar [losartan potassium]   Review of Systems Review of Systems  All other systems reviewed and are negative.    Physical Exam Updated Vital Signs BP 133/69    Pulse 95    Resp (!) 23    Ht 6\' 2"  (1.88 m)    Wt 80.7 kg    SpO2 97%    BMI 22.85 kg/m   Physical Exam Vitals signs and nursing note reviewed.    78 year old male, resting comfortably and in no acute distress. Vital signs are significant for elevated respiratory rate. Oxygen saturation is 97%, which is normal. Head is normocephalic and atraumatic. PERRLA, EOMI. Oropharynx is clear. Neck is nontender and supple without adenopathy or  JVD. Back is nontender and there is no CVA tenderness. Lungs have bibasilar rales without wheezes or rhonchi. Chest is nontender. Heart has regular rate and rhythm without murmur. Abdomen is soft, flat, nontender without masses or hepatosplenomegaly and peristalsis is normoactive. Extremities have trace edema, full range of motion is present.  AV fistula is present in the right upper arm with thrill present. Skin is warm and dry without rash. Neurologic: Mental status is normal, cranial nerves are intact, there are no motor or sensory deficits.  ED Treatments / Results  Labs (all labs ordered are listed, but only abnormal results are displayed) Labs Reviewed  CBC WITH DIFFERENTIAL/PLATELET - Abnormal; Notable for the following components:      Result Value   RBC 3.14 (*)    Hemoglobin 10.9 (*)    HCT 33.8 (*)    MCV 107.6 (*)    MCH 34.7 (*)    RDW 16.1 (*)    All other components within normal limits  BASIC METABOLIC PANEL - Abnormal; Notable for the following components:   Potassium 5.5 (*)    Chloride 94 (*)    Glucose, Bld 113 (*)    BUN 63 (*)    Creatinine, Ser 12.48 (*)    GFR calc non Af Amer 3 (*)    GFR calc Af Amer 4 (*)    Anion gap 19 (*)    All other components within normal limits  SARS CORONAVIRUS 2 (HOSPITAL ORDER, [facility])    EKG EKG Interpretation  Date/Time:  Saturday Apr 12 2019 01:32:34 EDT Ventricular Rate:  105 PR Interval:    QRS Duration: 144 QT Interval:  364 QTC Calculation: 482 R Axis:   -62 Text Interpretation:  Sinus or ectopic atrial tachycardia Probable left atrial enlargement Left bundle branch  block When compared with ECG of 03/25/2019, No significant change was found Confirmed by Delora Fuel ([id number]) on 04/12/2019 1:37:39 AM   Radiology Dg Chest Port 1 View  Result Date: 04/12/2019 CLINICAL DATA:  78 y/o  M; shortness of breath. EXAM: PORTABLE CHEST 1 VIEW COMPARISON:  03/26/2019 chest radiograph  FINDINGS: Stable cardiomegaly. Reticular and peripheral linear opacities in the mid and lower lung zones. No consolidation, effusion, pneumothorax. No acute osseous abnormality is evident. Surgical clips project over the left axilla. IMPRESSION: Stable cardiomegaly.  Interstitial pulmonary edema. Electronically Signed   By: Kristine Garbe M.D.   On: 04/12/2019 02:22    Procedures Procedures  Medications Ordered in ED Medications - No data to display   Initial Impression / Assessment and Plan / ED Course  I have reviewed the triage vital signs and the nursing notes.  Pertinent labs & imaging results that were available during my care of the patient were reviewed by me and considered in my medical decision making (see chart for details).  Fluid overload secondary to missing dialysis session.  Currently, he is doing well with oxygen via nasal cannula.  Will check chest x-ray and screening labs.  ECG shows left bundle branch block, unchanged from prior ECG.  Labs do show mild hyperkalemia, not requiring immediate intervention.  Chest x-ray shows interstitial edema which is consistent with his clinical presentation.  Mild to moderate anemia is present and is unchanged from baseline.  He has remained comfortable in the ED maintaining adequate oxygen saturation with oxygen via nasal cannula.  He is transported by ambulance to his dialysis center.  Final Clinical Impressions(s) / ED Diagnoses   Final diagnoses:  Acute pulmonary edema ([place])  End-stage renal disease on hemodialysis ([place])  Hyperkalemia  Macrocytic anemia  Anticoagulated on warfarin    ED Discharge Orders    None       Delora Fuel, MD 26/83/41 [phone]

## 2019-04-12 NOTE — ED Triage Notes (Signed)
Pt coming by EMS after complaints of shob that began early this morning. Pt receives dialysis M,T,Thur,Sat. But was unable to get dialysis on Thursday because fistula would not work correctly (began swelling). 90% on RA with EMS, pt placed on nonrebreather. Pt states he has had this happen several times and has hx of hospitalization with CPAPs before

## 2019-04-12 NOTE — Discharge Instructions (Signed)
Go directly to dialysis

## 2019-04-13 IMAGING — DX DG CHEST 1V PORT
1 series · 1 of 1 positions shown · non-contrast
Comparison: Chest x-ray of February 18, 2018

CLINICAL DATA: Acute on chronic CHF, COPD, respiratory arrest and
cardiac arrest. End-stage renal disease, HIV.

EXAM:
PORTABLE CHEST 1 VIEW

[chest ap]
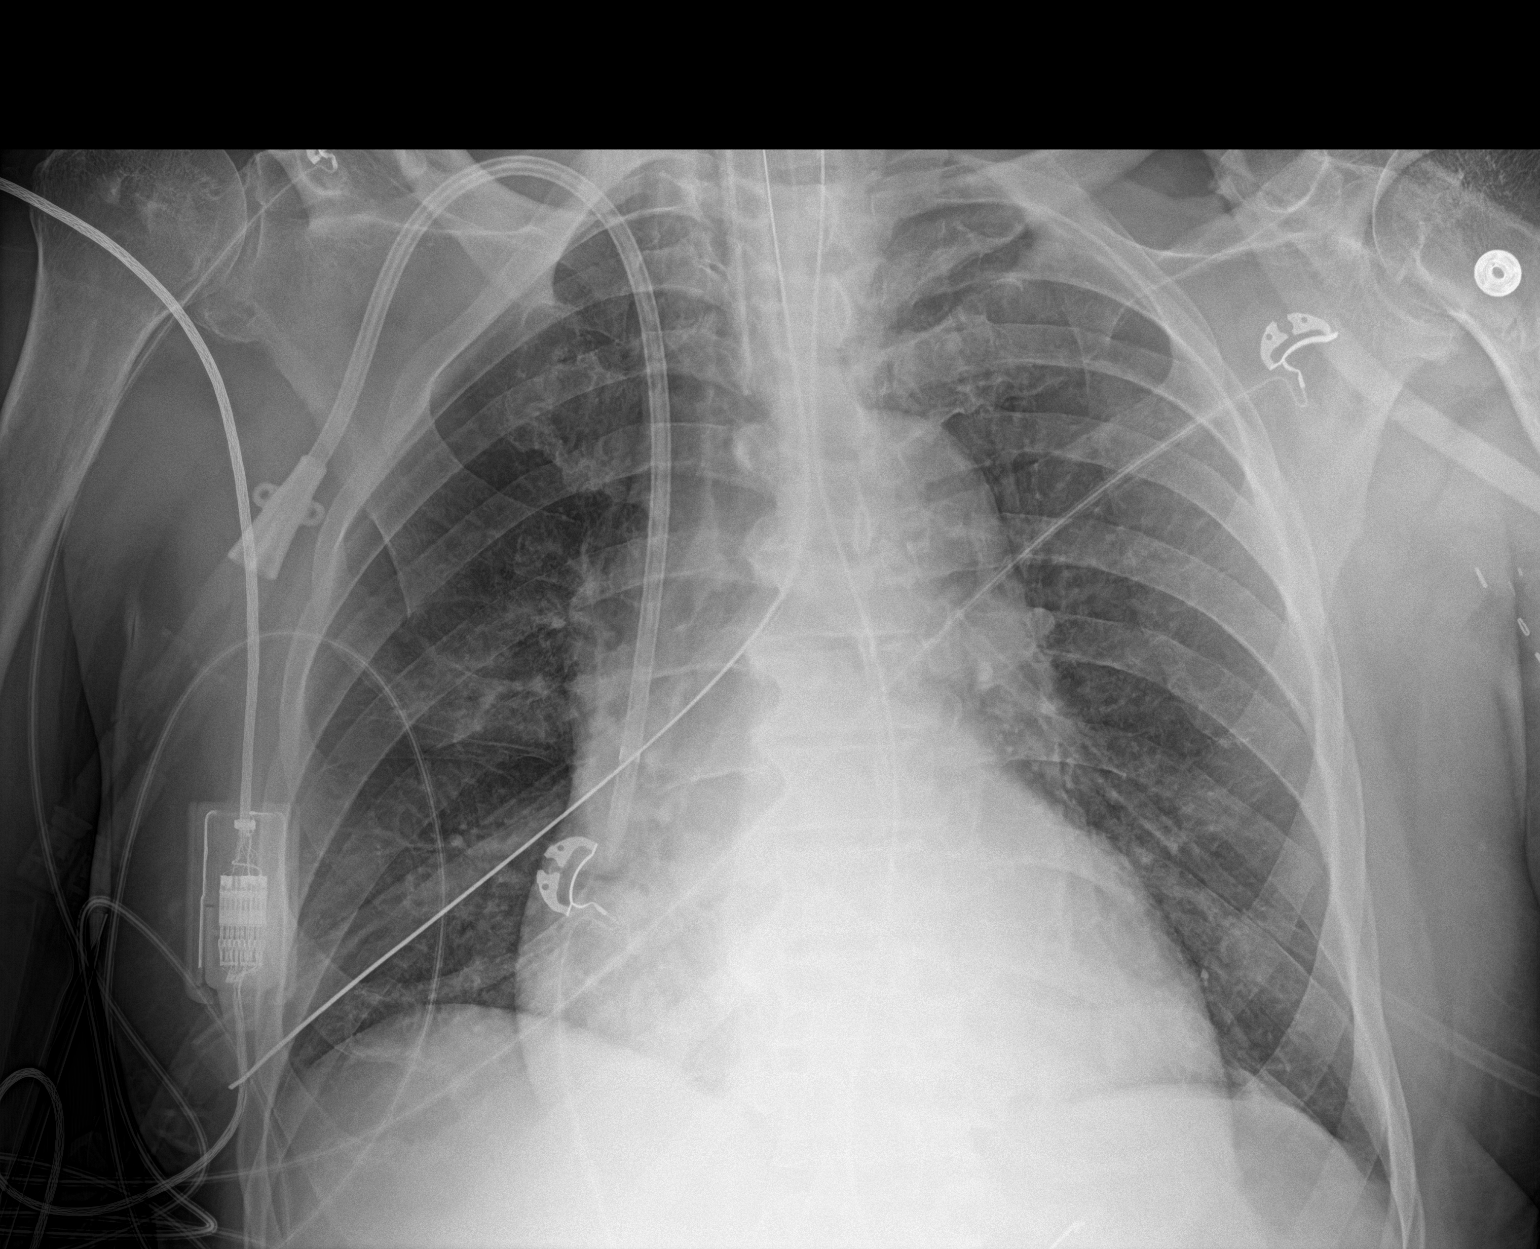

[1 of 1 positions shown; findings below may reference images not displayed]

FINDINGS: There has been interval placement of a dual-lumen dialysis catheter
whose tip projects at the right cavoatrial junction. The lungs are
well-expanded. The interstitial markings are slightly less
conspicuous overall today. There is no significant pleural effusion.
The cardiac silhouette is enlarged. The pulmonary vascularity is not
engorged. The endotracheal tube tip lies 5.9 cm above the carina.
The esophagogastric tube tip in proximal port project below the GE
junction.
IMPRESSION: Interval placement of a dialysis catheter via the right internal
jugular approach without postprocedure complication. Stable
cardiomegaly with mild improvement in interstitial edema. The
support tubes are in reasonable position.

## 2019-04-14 DIAGNOSIS — J810 Acute pulmonary edema: Secondary | ICD-10-CM | POA: Diagnosis not present

## 2019-04-14 DIAGNOSIS — N2581 Secondary hyperparathyroidism of renal origin: Secondary | ICD-10-CM | POA: Diagnosis not present

## 2019-04-14 DIAGNOSIS — N186 End stage renal disease: Secondary | ICD-10-CM | POA: Diagnosis not present

## 2019-04-15 DIAGNOSIS — N186 End stage renal disease: Secondary | ICD-10-CM | POA: Diagnosis not present

## 2019-04-15 DIAGNOSIS — J810 Acute pulmonary edema: Secondary | ICD-10-CM | POA: Diagnosis not present

## 2019-04-15 DIAGNOSIS — N2581 Secondary hyperparathyroidism of renal origin: Secondary | ICD-10-CM | POA: Diagnosis not present

## 2019-04-17 DIAGNOSIS — J810 Acute pulmonary edema: Secondary | ICD-10-CM | POA: Diagnosis not present

## 2019-04-17 DIAGNOSIS — N186 End stage renal disease: Secondary | ICD-10-CM | POA: Diagnosis not present

## 2019-04-17 DIAGNOSIS — N2581 Secondary hyperparathyroidism of renal origin: Secondary | ICD-10-CM | POA: Diagnosis not present

## 2019-04-18 LAB — PROTIME-INR

## 2019-04-19 DIAGNOSIS — J810 Acute pulmonary edema: Secondary | ICD-10-CM | POA: Diagnosis not present

## 2019-04-19 DIAGNOSIS — N2581 Secondary hyperparathyroidism of renal origin: Secondary | ICD-10-CM | POA: Diagnosis not present

## 2019-04-19 DIAGNOSIS — N186 End stage renal disease: Secondary | ICD-10-CM | POA: Diagnosis not present

## 2019-04-21 ENCOUNTER — Other Ambulatory Visit: Payer: Self-pay | Admitting: Cardiovascular Disease

## 2019-04-21 ENCOUNTER — Ambulatory Visit (INDEPENDENT_AMBULATORY_CARE_PROVIDER_SITE_OTHER): Payer: Medicare HMO | Admitting: Pharmacist Clinician (PhC)/ Clinical Pharmacy Specialist

## 2019-04-21 DIAGNOSIS — N186 End stage renal disease: Secondary | ICD-10-CM | POA: Diagnosis not present

## 2019-04-21 DIAGNOSIS — N2581 Secondary hyperparathyroidism of renal origin: Secondary | ICD-10-CM | POA: Diagnosis not present

## 2019-04-21 DIAGNOSIS — I4891 Unspecified atrial fibrillation: Secondary | ICD-10-CM | POA: Diagnosis not present

## 2019-04-21 DIAGNOSIS — Z7901 Long term (current) use of anticoagulants: Secondary | ICD-10-CM

## 2019-04-21 DIAGNOSIS — J810 Acute pulmonary edema: Secondary | ICD-10-CM | POA: Diagnosis not present

## 2019-04-21 DIAGNOSIS — Z515 Encounter for palliative care: Secondary | ICD-10-CM

## 2019-04-21 DIAGNOSIS — I482 Chronic atrial fibrillation, unspecified: Secondary | ICD-10-CM

## 2019-04-21 NOTE — Telephone Encounter (Signed)
°*  STAT* If patient is at the pharmacy, call can be transferred to refill team.   1. Which medications need to be refilled? (please list name of each medication and dose if known) midodrine (PROAMATINE) 10 MG tablet  2. Which pharmacy/location (including street and city if local pharmacy) is medication to be sent to? [facility] [place], [place]  3. Do they need a 30 day or 90 day supply? 90 days

## 2019-04-22 DIAGNOSIS — N186 End stage renal disease: Secondary | ICD-10-CM | POA: Diagnosis not present

## 2019-04-22 DIAGNOSIS — J810 Acute pulmonary edema: Secondary | ICD-10-CM | POA: Diagnosis not present

## 2019-04-22 DIAGNOSIS — N2581 Secondary hyperparathyroidism of renal origin: Secondary | ICD-10-CM | POA: Diagnosis not present

## 2019-04-22 MED ORDER — MIDODRINE HCL 10 MG PO TABS: 10.0000 mg | ORAL_TABLET | ORAL | 5 refills | Status: AC

## 2019-04-22 NOTE — Telephone Encounter (Signed)
Midodrine refilled

## 2019-04-24 DIAGNOSIS — J810 Acute pulmonary edema: Secondary | ICD-10-CM | POA: Diagnosis not present

## 2019-04-24 DIAGNOSIS — N2581 Secondary hyperparathyroidism of renal origin: Secondary | ICD-10-CM | POA: Diagnosis not present

## 2019-04-24 DIAGNOSIS — N186 End stage renal disease: Secondary | ICD-10-CM | POA: Diagnosis not present

## 2019-04-26 DIAGNOSIS — N2581 Secondary hyperparathyroidism of renal origin: Secondary | ICD-10-CM | POA: Diagnosis not present

## 2019-04-26 DIAGNOSIS — N186 End stage renal disease: Secondary | ICD-10-CM | POA: Diagnosis not present

## 2019-04-26 DIAGNOSIS — J810 Acute pulmonary edema: Secondary | ICD-10-CM | POA: Diagnosis not present

## 2019-04-28 DIAGNOSIS — N186 End stage renal disease: Secondary | ICD-10-CM | POA: Diagnosis not present

## 2019-04-28 DIAGNOSIS — J810 Acute pulmonary edema: Secondary | ICD-10-CM | POA: Diagnosis not present

## 2019-04-28 DIAGNOSIS — N2581 Secondary hyperparathyroidism of renal origin: Secondary | ICD-10-CM | POA: Diagnosis not present

## 2019-04-29 DIAGNOSIS — N186 End stage renal disease: Secondary | ICD-10-CM | POA: Diagnosis not present

## 2019-04-29 DIAGNOSIS — J810 Acute pulmonary edema: Secondary | ICD-10-CM | POA: Diagnosis not present

## 2019-04-29 DIAGNOSIS — N2581 Secondary hyperparathyroidism of renal origin: Secondary | ICD-10-CM | POA: Diagnosis not present

## 2019-05-01 DIAGNOSIS — N2581 Secondary hyperparathyroidism of renal origin: Secondary | ICD-10-CM | POA: Diagnosis not present

## 2019-05-01 DIAGNOSIS — N186 End stage renal disease: Secondary | ICD-10-CM | POA: Diagnosis not present

## 2019-05-01 DIAGNOSIS — J810 Acute pulmonary edema: Secondary | ICD-10-CM | POA: Diagnosis not present

## 2019-05-01 LAB — PROTIME-INR: INR: 4 — AB (ref 0.9–1.1)

## 2019-05-03 DIAGNOSIS — N186 End stage renal disease: Secondary | ICD-10-CM | POA: Diagnosis not present

## 2019-05-03 DIAGNOSIS — N2581 Secondary hyperparathyroidism of renal origin: Secondary | ICD-10-CM | POA: Diagnosis not present

## 2019-05-03 DIAGNOSIS — J810 Acute pulmonary edema: Secondary | ICD-10-CM | POA: Diagnosis not present

## 2019-05-04 DIAGNOSIS — Z992 Dependence on renal dialysis: Secondary | ICD-10-CM | POA: Diagnosis not present

## 2019-05-04 DIAGNOSIS — B20 Human immunodeficiency virus [HIV] disease: Secondary | ICD-10-CM | POA: Diagnosis not present

## 2019-05-04 DIAGNOSIS — N186 End stage renal disease: Secondary | ICD-10-CM | POA: Diagnosis not present

## 2019-05-05 ENCOUNTER — Ambulatory Visit (INDEPENDENT_AMBULATORY_CARE_PROVIDER_SITE_OTHER): Payer: Medicare HMO | Admitting: Pharmacist Clinician (PhC)/ Clinical Pharmacy Specialist

## 2019-05-05 DIAGNOSIS — J810 Acute pulmonary edema: Secondary | ICD-10-CM | POA: Diagnosis not present

## 2019-05-05 DIAGNOSIS — I482 Chronic atrial fibrillation, unspecified: Secondary | ICD-10-CM

## 2019-05-05 DIAGNOSIS — I4891 Unspecified atrial fibrillation: Secondary | ICD-10-CM | POA: Diagnosis not present

## 2019-05-05 DIAGNOSIS — Z7901 Long term (current) use of anticoagulants: Secondary | ICD-10-CM

## 2019-05-05 DIAGNOSIS — N186 End stage renal disease: Secondary | ICD-10-CM | POA: Diagnosis not present

## 2019-05-05 DIAGNOSIS — Z515 Encounter for palliative care: Secondary | ICD-10-CM

## 2019-05-05 DIAGNOSIS — N2581 Secondary hyperparathyroidism of renal origin: Secondary | ICD-10-CM | POA: Diagnosis not present

## 2019-05-06 ENCOUNTER — Telehealth: Payer: Self-pay | Admitting: Cardiology

## 2019-05-06 DIAGNOSIS — N2581 Secondary hyperparathyroidism of renal origin: Secondary | ICD-10-CM | POA: Diagnosis not present

## 2019-05-06 DIAGNOSIS — N186 End stage renal disease: Secondary | ICD-10-CM | POA: Diagnosis not present

## 2019-05-06 DIAGNOSIS — J810 Acute pulmonary edema: Secondary | ICD-10-CM | POA: Diagnosis not present

## 2019-05-06 NOTE — Telephone Encounter (Signed)
noted 

## 2019-05-06 NOTE — Telephone Encounter (Signed)
[facility] called stating it's not necessary to fax them any paper work with changes with his warfarin changes and they don't get involved with relaying any instructions to the patient.  They only draw his PT/INR as a courtesy, and the turn around time is two days.

## 2019-05-08 DIAGNOSIS — N2581 Secondary hyperparathyroidism of renal origin: Secondary | ICD-10-CM | POA: Diagnosis not present

## 2019-05-08 DIAGNOSIS — N186 End stage renal disease: Secondary | ICD-10-CM | POA: Diagnosis not present

## 2019-05-08 DIAGNOSIS — J810 Acute pulmonary edema: Secondary | ICD-10-CM | POA: Diagnosis not present

## 2019-05-10 DIAGNOSIS — N186 End stage renal disease: Secondary | ICD-10-CM | POA: Diagnosis not present

## 2019-05-10 DIAGNOSIS — J810 Acute pulmonary edema: Secondary | ICD-10-CM | POA: Diagnosis not present

## 2019-05-10 DIAGNOSIS — N2581 Secondary hyperparathyroidism of renal origin: Secondary | ICD-10-CM | POA: Diagnosis not present

## 2019-05-12 ENCOUNTER — Other Ambulatory Visit: Payer: Self-pay

## 2019-05-12 DIAGNOSIS — J810 Acute pulmonary edema: Secondary | ICD-10-CM | POA: Diagnosis not present

## 2019-05-12 DIAGNOSIS — N186 End stage renal disease: Secondary | ICD-10-CM | POA: Diagnosis not present

## 2019-05-12 DIAGNOSIS — N2581 Secondary hyperparathyroidism of renal origin: Secondary | ICD-10-CM | POA: Diagnosis not present

## 2019-05-12 MED ORDER — AMIODARONE HCL 200 MG PO TABS: 200.0000 mg | ORAL_TABLET | Freq: Every day | ORAL | 3 refills | Status: DC

## 2019-05-12 NOTE — Telephone Encounter (Signed)
Rx(s) sent to pharmacy electronically.  

## 2019-05-13 DIAGNOSIS — N186 End stage renal disease: Secondary | ICD-10-CM | POA: Diagnosis not present

## 2019-05-13 DIAGNOSIS — N2581 Secondary hyperparathyroidism of renal origin: Secondary | ICD-10-CM | POA: Diagnosis not present

## 2019-05-13 DIAGNOSIS — J810 Acute pulmonary edema: Secondary | ICD-10-CM | POA: Diagnosis not present

## 2019-05-16 DIAGNOSIS — N186 End stage renal disease: Secondary | ICD-10-CM | POA: Diagnosis not present

## 2019-05-16 DIAGNOSIS — N2581 Secondary hyperparathyroidism of renal origin: Secondary | ICD-10-CM | POA: Diagnosis not present

## 2019-05-16 DIAGNOSIS — J810 Acute pulmonary edema: Secondary | ICD-10-CM | POA: Diagnosis not present

## 2019-05-16 LAB — PROTIME-INR: INR: 3.1 — AB (ref 0.9–1.1)

## 2019-05-17 DIAGNOSIS — N2581 Secondary hyperparathyroidism of renal origin: Secondary | ICD-10-CM | POA: Diagnosis not present

## 2019-05-17 DIAGNOSIS — J810 Acute pulmonary edema: Secondary | ICD-10-CM | POA: Diagnosis not present

## 2019-05-17 DIAGNOSIS — N186 End stage renal disease: Secondary | ICD-10-CM | POA: Diagnosis not present

## 2019-05-19 ENCOUNTER — Ambulatory Visit (INDEPENDENT_AMBULATORY_CARE_PROVIDER_SITE_OTHER): Payer: Medicare HMO | Admitting: Pharmacist Clinician (PhC)/ Clinical Pharmacy Specialist

## 2019-05-19 DIAGNOSIS — Z7901 Long term (current) use of anticoagulants: Secondary | ICD-10-CM

## 2019-05-19 DIAGNOSIS — N2581 Secondary hyperparathyroidism of renal origin: Secondary | ICD-10-CM | POA: Diagnosis not present

## 2019-05-19 DIAGNOSIS — I4891 Unspecified atrial fibrillation: Secondary | ICD-10-CM | POA: Diagnosis not present

## 2019-05-19 DIAGNOSIS — I482 Chronic atrial fibrillation, unspecified: Secondary | ICD-10-CM

## 2019-05-19 DIAGNOSIS — N186 End stage renal disease: Secondary | ICD-10-CM | POA: Diagnosis not present

## 2019-05-19 DIAGNOSIS — J810 Acute pulmonary edema: Secondary | ICD-10-CM | POA: Diagnosis not present

## 2019-05-19 DIAGNOSIS — Z515 Encounter for palliative care: Secondary | ICD-10-CM

## 2019-05-20 DIAGNOSIS — N2581 Secondary hyperparathyroidism of renal origin: Secondary | ICD-10-CM | POA: Diagnosis not present

## 2019-05-20 DIAGNOSIS — J810 Acute pulmonary edema: Secondary | ICD-10-CM | POA: Diagnosis not present

## 2019-05-20 DIAGNOSIS — N186 End stage renal disease: Secondary | ICD-10-CM | POA: Diagnosis not present

## 2019-05-22 DIAGNOSIS — N186 End stage renal disease: Secondary | ICD-10-CM | POA: Diagnosis not present

## 2019-05-22 DIAGNOSIS — N2581 Secondary hyperparathyroidism of renal origin: Secondary | ICD-10-CM | POA: Diagnosis not present

## 2019-05-22 DIAGNOSIS — J810 Acute pulmonary edema: Secondary | ICD-10-CM | POA: Diagnosis not present

## 2019-05-23 ENCOUNTER — Ambulatory Visit: Payer: Medicare HMO | Admitting: Family

## 2019-05-24 DIAGNOSIS — N186 End stage renal disease: Secondary | ICD-10-CM | POA: Diagnosis not present

## 2019-05-24 DIAGNOSIS — J810 Acute pulmonary edema: Secondary | ICD-10-CM | POA: Diagnosis not present

## 2019-05-24 DIAGNOSIS — N2581 Secondary hyperparathyroidism of renal origin: Secondary | ICD-10-CM | POA: Diagnosis not present

## 2019-05-26 DIAGNOSIS — J810 Acute pulmonary edema: Secondary | ICD-10-CM | POA: Diagnosis not present

## 2019-05-26 DIAGNOSIS — N186 End stage renal disease: Secondary | ICD-10-CM | POA: Diagnosis not present

## 2019-05-26 DIAGNOSIS — N2581 Secondary hyperparathyroidism of renal origin: Secondary | ICD-10-CM | POA: Diagnosis not present

## 2019-05-27 DIAGNOSIS — N2581 Secondary hyperparathyroidism of renal origin: Secondary | ICD-10-CM | POA: Diagnosis not present

## 2019-05-27 DIAGNOSIS — N186 End stage renal disease: Secondary | ICD-10-CM | POA: Diagnosis not present

## 2019-05-27 DIAGNOSIS — J810 Acute pulmonary edema: Secondary | ICD-10-CM | POA: Diagnosis not present

## 2019-05-29 DIAGNOSIS — N2581 Secondary hyperparathyroidism of renal origin: Secondary | ICD-10-CM | POA: Diagnosis not present

## 2019-05-29 DIAGNOSIS — J810 Acute pulmonary edema: Secondary | ICD-10-CM | POA: Diagnosis not present

## 2019-05-29 DIAGNOSIS — N186 End stage renal disease: Secondary | ICD-10-CM | POA: Diagnosis not present

## 2019-05-29 LAB — PROTIME-INR: INR: 3.5 — AB (ref 0.9–1.1)

## 2019-05-31 DIAGNOSIS — J810 Acute pulmonary edema: Secondary | ICD-10-CM | POA: Diagnosis not present

## 2019-05-31 DIAGNOSIS — N186 End stage renal disease: Secondary | ICD-10-CM | POA: Diagnosis not present

## 2019-05-31 DIAGNOSIS — N2581 Secondary hyperparathyroidism of renal origin: Secondary | ICD-10-CM | POA: Diagnosis not present

## 2019-06-02 ENCOUNTER — Ambulatory Visit (INDEPENDENT_AMBULATORY_CARE_PROVIDER_SITE_OTHER): Payer: Medicare HMO | Admitting: Internal Medicine

## 2019-06-02 DIAGNOSIS — N2581 Secondary hyperparathyroidism of renal origin: Secondary | ICD-10-CM | POA: Diagnosis not present

## 2019-06-02 DIAGNOSIS — H401133 Primary open-angle glaucoma, bilateral, severe stage: Secondary | ICD-10-CM | POA: Diagnosis not present

## 2019-06-02 DIAGNOSIS — Z5181 Encounter for therapeutic drug level monitoring: Secondary | ICD-10-CM | POA: Diagnosis not present

## 2019-06-02 DIAGNOSIS — Z961 Presence of intraocular lens: Secondary | ICD-10-CM | POA: Diagnosis not present

## 2019-06-02 DIAGNOSIS — Z515 Encounter for palliative care: Secondary | ICD-10-CM

## 2019-06-02 DIAGNOSIS — J810 Acute pulmonary edema: Secondary | ICD-10-CM | POA: Diagnosis not present

## 2019-06-02 DIAGNOSIS — N186 End stage renal disease: Secondary | ICD-10-CM | POA: Diagnosis not present

## 2019-06-02 DIAGNOSIS — I482 Chronic atrial fibrillation, unspecified: Secondary | ICD-10-CM | POA: Diagnosis not present

## 2019-06-02 DIAGNOSIS — H04123 Dry eye syndrome of bilateral lacrimal glands: Secondary | ICD-10-CM | POA: Diagnosis not present

## 2019-06-02 NOTE — Patient Instructions (Signed)
Description   Per patient has been taking 10 mg daily for "a long time". Telephoned pharmacy and pt has been receiving a 5mg  tablet. Skip today's dose then start taking 5mg  daily except 2.5mg  on Mondays and Fridays.   Recheck in 2 weeks. Orders faxed to [facility] at [phone]

## 2019-06-03 DIAGNOSIS — Z992 Dependence on renal dialysis: Secondary | ICD-10-CM | POA: Diagnosis not present

## 2019-06-03 DIAGNOSIS — N2581 Secondary hyperparathyroidism of renal origin: Secondary | ICD-10-CM | POA: Diagnosis not present

## 2019-06-03 DIAGNOSIS — B20 Human immunodeficiency virus [HIV] disease: Secondary | ICD-10-CM | POA: Diagnosis not present

## 2019-06-03 DIAGNOSIS — N186 End stage renal disease: Secondary | ICD-10-CM | POA: Diagnosis not present

## 2019-06-03 DIAGNOSIS — J810 Acute pulmonary edema: Secondary | ICD-10-CM | POA: Diagnosis not present

## 2019-06-05 ENCOUNTER — Encounter: Payer: Self-pay | Admitting: Cardiovascular Disease

## 2019-06-05 DIAGNOSIS — N186 End stage renal disease: Secondary | ICD-10-CM | POA: Diagnosis not present

## 2019-06-05 DIAGNOSIS — J810 Acute pulmonary edema: Secondary | ICD-10-CM | POA: Diagnosis not present

## 2019-06-05 DIAGNOSIS — N2581 Secondary hyperparathyroidism of renal origin: Secondary | ICD-10-CM | POA: Diagnosis not present

## 2019-06-05 LAB — PROTIME-INR

## 2019-06-07 DIAGNOSIS — N2581 Secondary hyperparathyroidism of renal origin: Secondary | ICD-10-CM | POA: Diagnosis not present

## 2019-06-07 DIAGNOSIS — J810 Acute pulmonary edema: Secondary | ICD-10-CM | POA: Diagnosis not present

## 2019-06-07 DIAGNOSIS — N186 End stage renal disease: Secondary | ICD-10-CM | POA: Diagnosis not present

## 2019-06-09 DIAGNOSIS — N186 End stage renal disease: Secondary | ICD-10-CM | POA: Diagnosis not present

## 2019-06-09 DIAGNOSIS — J810 Acute pulmonary edema: Secondary | ICD-10-CM | POA: Diagnosis not present

## 2019-06-09 DIAGNOSIS — N2581 Secondary hyperparathyroidism of renal origin: Secondary | ICD-10-CM | POA: Diagnosis not present

## 2019-06-10 DIAGNOSIS — J810 Acute pulmonary edema: Secondary | ICD-10-CM | POA: Diagnosis not present

## 2019-06-10 DIAGNOSIS — N186 End stage renal disease: Secondary | ICD-10-CM | POA: Diagnosis not present

## 2019-06-10 DIAGNOSIS — N2581 Secondary hyperparathyroidism of renal origin: Secondary | ICD-10-CM | POA: Diagnosis not present

## 2019-06-12 ENCOUNTER — Telehealth: Payer: Self-pay | Admitting: *Deleted

## 2019-06-12 DIAGNOSIS — N2581 Secondary hyperparathyroidism of renal origin: Secondary | ICD-10-CM | POA: Diagnosis not present

## 2019-06-12 DIAGNOSIS — J810 Acute pulmonary edema: Secondary | ICD-10-CM | POA: Diagnosis not present

## 2019-06-12 DIAGNOSIS — N186 End stage renal disease: Secondary | ICD-10-CM | POA: Diagnosis not present

## 2019-06-12 LAB — PROTIME-INR: INR: 2.6 — AB (ref 0.9–1.1)

## 2019-06-12 NOTE — Telephone Encounter (Signed)
    COVID-19 Pre-Screening Questions:  . In the past 7 to 10 days have you had a cough,  shortness of breath, headache, congestion, fever (100 or greater) body aches, chills, sore throat, or sudden loss of taste or sense of smell? No . Have you been around anyone with known Covid 19. . Have you been around anyone who is awaiting Covid 19 test results in the past 7 to 10 days? No . Have you been around anyone who has been exposed to Covid 19, or has mentioned symptoms of Covid 19 within the past 7 to 10 days? No  If you have any concerns/questions about symptoms patients report during screening (either on the phone or at [place]). Contact the provider seeing the patient or DOD for further guidance.  If neither are available contact a member of the leadership team.            

## 2019-06-13 ENCOUNTER — Encounter: Payer: Self-pay | Admitting: Cardiovascular Disease

## 2019-06-13 ENCOUNTER — Other Ambulatory Visit: Payer: Self-pay

## 2019-06-13 ENCOUNTER — Ambulatory Visit (INDEPENDENT_AMBULATORY_CARE_PROVIDER_SITE_OTHER): Payer: Medicare HMO | Admitting: Cardiovascular Disease

## 2019-06-13 VITALS — BP 114/64 | HR 85 | Ht 74.0 in | Wt 184.0 lb

## 2019-06-13 DIAGNOSIS — I420 Dilated cardiomyopathy: Secondary | ICD-10-CM

## 2019-06-13 DIAGNOSIS — Z21 Asymptomatic human immunodeficiency virus [HIV] infection status: Secondary | ICD-10-CM | POA: Diagnosis not present

## 2019-06-13 DIAGNOSIS — N186 End stage renal disease: Secondary | ICD-10-CM

## 2019-06-13 DIAGNOSIS — I2510 Atherosclerotic heart disease of native coronary artery without angina pectoris: Secondary | ICD-10-CM | POA: Diagnosis not present

## 2019-06-13 DIAGNOSIS — A429 Actinomycosis, unspecified: Secondary | ICD-10-CM

## 2019-06-13 DIAGNOSIS — K559 Vascular disorder of intestine, unspecified: Secondary | ICD-10-CM

## 2019-06-13 DIAGNOSIS — I953 Hypotension of hemodialysis: Secondary | ICD-10-CM

## 2019-06-13 DIAGNOSIS — J449 Chronic obstructive pulmonary disease, unspecified: Secondary | ICD-10-CM

## 2019-06-13 DIAGNOSIS — Z992 Dependence on renal dialysis: Secondary | ICD-10-CM

## 2019-06-13 DIAGNOSIS — I480 Paroxysmal atrial fibrillation: Secondary | ICD-10-CM

## 2019-06-13 DIAGNOSIS — I5042 Chronic combined systolic (congestive) and diastolic (congestive) heart failure: Secondary | ICD-10-CM

## 2019-06-13 NOTE — Patient Instructions (Signed)
Medication Instructions:  Your physician recommends that you continue on your current medications as directed. Please refer to the Current Medication list given to you today.  If you need a refill on your cardiac medications before your next appointment, please call your pharmacy.   Lab work: NONE   Testing/Procedures: NONE  Follow-Up: At [facility], you and your health needs are our priority.  As part of our continuing mission to provide you with exceptional heart care, we have created designated Provider Care Teams.  These Care Teams include your primary Cardiologist (physician) and Advanced Practice Providers (APPs -  Physician Assistants and Nurse Practitioners) who all work together to provide you with the care you need, when you need it. You will need a follow up appointment in 6 months.  Please call our office 2 months in advance to schedule this appointment.  You may see [name], MD or one of the following Advanced Practice Providers on your designated Care Team: [place], [place] . Fabian Sharp, PA-C

## 2019-06-13 NOTE — Progress Notes (Signed)
Patient ID: David [name], male   DOB: 23-Feb-1941, 78 y.o.   MRN: [id number]    Cardiology Office Note    Date:  06/13/2019   ID:  [address] [place] [birth year], MRN [id number]  PCP:  [name], NP  Cardiologist:   [name], MD   No chief complaint on file.   History of Present Illness:  David [name] is a 78 y.o. male with CHF (systolic and diastolic), PAFib, history of PEA arrest, nonischemic dilated cardiomyopathy and ESRD.  David [name] is doing much better since the last time I saw him.  He was being frequently admitted to the hospital with pulmonary edema on Monday, after the longer pause in his hemodialysis (schedule Tuesday Thursday Saturday).  He is now receiving hemodialysis 4 days a week (Monday, Tuesday, Thursday, Saturday) with sessions that are little bit abbreviated at 3-1/2 hours, frequently avoiding the problems with hypotension that he otherwise gets.  He takes three doses of midodrine 10 mg in rapid succession on the days that he has dialysis.  He takes a dose as soon as he wakes up around 4:30 in the morning, another dose when dialysis starts around 6 AM, the third the dose halfway through dialysis around 8 AM.  With this regimen, it is relatively infrequent that he requires interruption in hemodialysis or administration of IV fluids.  He does not take any vasoconstrictor on non-hemodialysis days and his typical blood pressure is 120/70.  He has not experienced syncope and denies dyspnea or edema since this new system has been implemented.  His dry weight is 81 kg and he rarely strays more than a couple of pounds from that target.  He denies angina pectoris and has not had problems with palpitations or overt bleeding.  He is on warfarin anticoagulation and takes amiodarone to prevent atrial fibrillation.  He is in regular rhythm today.  He is no longer taking any medications for congestive heart failure.  He reports that he still makes scanty  amounts of urine on evenings of nondialysis days.  [name] has a long-standing history of nonischemic cardiomyopathy (normal coronary angio 2004) suffered a PEA arrest on February 17, 2830, complicated by development of new atrial fibrillation and progression to end-stage renal disease now on hemodialysis.  Most recently his ejection fraction has been estimated to be 15-20%.  Coronary angiography after his cardiac arrest showed only minor atherosclerosis without significant stenotic lesions.  He has an atypical left bundle branch block.   He is HIV positive, with undetectable viral load on antiretroviral therapy, monitored by Dr. Tommy Medal.     He has had multiple failed AV fistulas and is now receiving dialysis via a right arm AV graft.   Past Medical History:  Diagnosis Date  . 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 Acute on chronic systolic and diastolic heart failure, NYHA class 4 ([place])   . Anemia, iron deficiency 11/15/2011  . Arthritis    "hands, right knee, feet" (02/21/2018)  . Atrial fibrillation ([place])   . Cancer [facility])    hx of prostate; s/p radioactive seed implant 10/2009 Dr Janice Norrie  . Cardiac arrest ([place]) 02/17/2018  . CHF (congestive heart failure) ([place])   . Chronic combined systolic and diastolic heart failure, NYHA class 3 ([place]) 06/2010   felt to be secondary to hypertensive cardiomyopathy  . Chronic lower back pain   . CKD (chronic kidney disease) stage V requiring chronic dialysis ([place])   . ESRD on dialysis [facility])  started 02/2018  . GERD (gastroesophageal reflux disease)   . Glaucoma   . Gout    daily RX (02/21/2018)  . Heart murmur    "mild" per pt  . Hepatitis    years ago  . HIV (human immunodeficiency virus infection) ([place])   . HIV infection ([place])   . Hyperlipidemia   . Hypertension    followed by [facility] (Dr Dani Gobble Kelvin Sennett)  . Hypertension   . Nonischemic cardiomyopathy ([place])   . Pneumonia 11/2017  . Prostate cancer ([place])   . Sinus  bradycardia   . Sleep apnea    does not use a cpap  . Stroke [facility])    "mini stroke" years ago    Past Surgical History:  Procedure Laterality Date  . AV FISTULA PLACEMENT Right 10/13/2016   Procedure: ARTERIOVENOUS (AV) FISTULA CREATION;  Surgeon: [name], MD;  Location: [place];  Service: Vascular;  Laterality: Right;  . AV FISTULA PLACEMENT Left 02/25/2018   Procedure: INSERTION OF ARTERIOVENOUS (AV) GORE-TEX GRAFT LEFT UPPER ARM;  Surgeon: Angelia Mould, MD;  Location: [place];  Service: Vascular;  Laterality: Left;  . AV FISTULA PLACEMENT Right 08/07/2018   Procedure: Creation of right arm brachiocephalic Fistula;  Surgeon: Waynetta Sandy, MD;  Location: [place];  Service: Vascular;  Laterality: Right;  . BASCILIC VEIN TRANSPOSITION Left 06/24/2015   Procedure: BASILIC VEIN TRANSPOSITION;  Surgeon: Mal Misty, MD;  Location: [place];  Service: Vascular;  Laterality: Left;  . BIOPSY  03/14/2019   Procedure: BIOPSY;  Surgeon: Irene Shipper, MD;  Location: [place];  Service: Endoscopy;;  . CATARACT EXTRACTION, BILATERAL Bilateral   . COLONOSCOPY WITH PROPOFOL N/A 03/14/2019   Procedure: COLONOSCOPY WITH PROPOFOL;  Surgeon: Irene Shipper, MD;  Location: [facility];  Service: Endoscopy;  Laterality: N/A;  . ESOPHAGOGASTRODUODENOSCOPY (EGD) WITH PROPOFOL N/A 05/05/2014   Procedure: ESOPHAGOGASTRODUODENOSCOPY (EGD) WITH PROPOFOL;  Surgeon: Missy Sabins, MD;  Location: [facility];  Service: Endoscopy;  Laterality: N/A;  . EYE SURGERY Bilateral    cataract surgery   . GLAUCOMA SURGERY Bilateral   . INSERTION OF ARTERIOVENOUS (AV) ARTEGRAFT ARM  10/09/2018   Procedure: INSERTION OF ARTERIOVENOUS (AV) 27mm x 41cm ARTEGRAFT LEFT UPPER ARM;  Surgeon: Waynetta Sandy, MD;  Location: [place];  Service: Vascular;;  . INSERTION PROSTATE RADIATION SEED    . IR FLUORO GUIDE CV LINE RIGHT  02/18/2018  . IR REMOVAL TUN CV CATH W/O FL  12/06/2018  . IR US GUIDE VASC ACCESS  RIGHT  02/18/2018  . LEFT HEART CATH AND CORONARY ANGIOGRAPHY N/A 02/20/2018   Procedure: LEFT HEART CATH AND CORONARY ANGIOGRAPHY;  Surgeon: Jettie Booze, MD;  Location: [place] CV LAB;;; 50% ostial OM2, 25% mLAD.  Marland Kitchen LEFT HEART CATH AND CORONARY ANGIOGRAPHY  2004   mildly depressed LV systolic fx EF 82%,UMPNTI coronaries/abdominal aorta/renal arteries.  Marland Kitchen NM MYOCAR PERF WALL MOTION  02/21/2010   No ischemia or infarction.  EF 27%.  Marland Kitchen RADIOACTIVE SEED IMPLANT  2010   prostate cancer  . THROMBECTOMY W/ EMBOLECTOMY Left 02/26/2018   Procedure: THROMBECTOMY ARTERIOVENOUS GRAFT;  Surgeon: Angelia Mould, MD;  Location: [place];  Service: Vascular;  Laterality: Left;  . TRANSTHORACIC ECHOCARDIOGRAM  02/2012   [name]) EF 45-50% with mild global HK.  Mild LVH,LA mod. dilated,mild-mod. 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 AI.  Marland Kitchen TRANSTHORACIC ECHOCARDIOGRAM  9/'18; 3/'19   a) Severely  reduced EF.  25 from 30%.  GR 1 DD with diffuse ecchymosis. Mild AS & AI.  Mild LA/RA dilation; b) (post PEA arest):   Moderately dilated LV with moderate concentric virtually.  EF 15 to 20%.  GR 1 DD.  Paradoxical septal motion. Mild AI.  Mild LA dilation.  Poorly visualized RV.  Marland Kitchen TRANSTHORACIC ECHOCARDIOGRAM  02/2019   EF 20-25%.  Unable to assess diastolic function (A. Fib).  No LV apical thrombus.  Mild AI.  Mildly reduced RV function, however poorly visualized.  Marland Kitchen ULTRASOUND GUIDANCE FOR VASCULAR ACCESS  02/20/2018   Procedure: Ultrasound Guidance For Vascular Access;  Surgeon: Jettie Booze, MD;  Location: [place] CV [facility];  Service: Cardiovascular;;  . UPPER EXTREMITY VENOGRAPHY N/A 07/08/2018   Procedure: UPPER EXTREMITY VENOGRAPHY;  Surgeon: Waynetta Sandy, MD;  Location: [name] [facility];  Service: Cardiovascular;  Laterality: N/A;  Bilateral    Current Outpatient Medications  Medication Sig Dispense Refill  . acetaminophen (TYLENOL) 500 MG  tablet Take 500-1,000 mg by mouth every 6 (six) hours as needed for mild pain.     Marland Kitchen allopurinol (ZYLOPRIM) 100 MG tablet TAKE 1 TABLET EVERY DAY (Patient taking differently: Take 100 mg by mouth daily. ) 90 tablet 1  . amiodarone (PACERONE) 200 MG tablet Take 1 tablet (200 mg total) by mouth daily. 90 tablet 3  . amoxicillin (AMOXIL) 500 MG capsule Take QDAILY in the am, then on dialysis days take ANOTHER dose AFTER HD 60 capsule 5  . AURYXIA 1 GM 210 MG(Fe) tablet Take 420 mg by mouth 3 (three) times daily with meals.     . bictegravir-emtricitabine-tenofovir AF ([name]) 50-200-25 MG TABS tablet Take 1 tablet by mouth daily. 30 tablet 11  . brimonidine (ALPHAGAN) 0.15 % ophthalmic solution Place 1 drop into the left eye 2 (two) times daily.  3  . calcitRIOL (ROCALTROL) 0.5 MCG capsule Take 1 capsule (0.5 mcg total) by mouth 3 (three) times a week.    . dorzolamide-timolol (COSOPT) 22.3-6.8 MG/ML ophthalmic solution Place 1 drop into the left eye 2 (two) times daily.    Marland Kitchen latanoprost (XALATAN) 0.005 % ophthalmic solution Place 1 drop into the left eye at bedtime.     Marland Kitchen loratadine (CLARITIN) 10 MG tablet Take 1 tablet (10 mg total) by mouth daily. 30 tablet 11  . midodrine (PROAMATINE) 10 MG tablet Take 1 tablet (10 mg total) by mouth as directed. Twice a day on the day of HD Tuesday, Thursday, Saturday (1 in AM and 1 at [place]). Take 1/2 tablet (5mg ) a day on non HD days 90 tablet 5  . multivitamin (RENA-VIT) TABS tablet Take 1 tablet by mouth daily.    Marland Kitchen warfarin (COUMADIN) 5 MG tablet Take 2 tablets by mouth daily or as directed by coumadin clinic (Patient taking differently: Take 10 mg by mouth daily. ) 60 tablet 1   No current facility-administered medications for this visit.     Allergies:   Cozaar [losartan potassium]   Social History   Socioeconomic History  . Marital status: Married    Spouse name: Not on file  . Number of children: 2  . Years of education: 96  . Highest  education level: Not on file  Occupational History  . Occupation: retired, picks up Aeronautical engineer: RETIRED  Social Needs  . Financial resource strain: Not on file  . Food insecurity    Worry: Not on file    Inability:  Not on file  . Transportation needs    Medical: Not on file    Non-medical: Not on file  Tobacco Use  . Smoking status: Former Smoker    Packs/day: 1.00    Years: 30.00    Pack years: 30.00    Types: Cigarettes    Quit date: 2006    Years since quitting: 14.5  . Smokeless tobacco: Never Used  Substance and Sexual Activity  . Alcohol use: Yes    Comment: once a week-wine  . Drug use: Never  . Sexual activity: Not Currently  Lifestyle  . Physical activity    Days per week: Not on file    Minutes per session: Not on file  . Stress: Not on file  Relationships  . Social Herbalist on phone: Not on file    Gets together: Not on file    Attends religious service: Not on file    Active member of club or organization: Not on file    Attends meetings of clubs or organizations: Not on file    Relationship status: Not on file  Other Topics Concern  . Not on file  Social History Narrative   ** Merged History Encounter **       Stays active at home Regular exercise: no Drinks 2 cups of coffee a week, 1 mountain dew soda a day.     Family History:  The patient's family history includes Cholelithiasis in his daughter and son; Diabetes in his maternal grandmother; Hypertension in his maternal grandmother and mother; Thyroid disease in his mother.   ROS:   Please see the history of present illness.    All other systems are reviewed and are negative  PHYSICAL EXAM:   VS:  BP 114/64   Pulse 85   Ht 6\' 2"  (1.88 m)   Wt 184 lb (83.5 kg)   SpO2 100%   BMI 23.62 kg/m      General: Alert, oriented x3, no distress, appears chronically ill Head: no evidence of trauma, PERRL, EOMI, no exophtalmos or lid lag, no myxedema, no xanthelasma; normal  ears, nose and oropharynx Neck: normal jugular venous pulsations and no hepatojugular reflux; brisk carotid pulses without delay and no carotid bruits Chest: clear to auscultation, no signs of consolidation by percussion or palpation, normal fremitus, symmetrical and full respiratory excursions Cardiovascular: normal position and quality of the apical impulse, regular rhythm, normal first and second heart sounds, no murmurs, rubs or gallops.  Excellent thrill and bruit overlying right upper arm AV graft. Abdomen: no tenderness or distention, no masses by palpation, no abnormal pulsatility or arterial bruits, normal bowel sounds, no hepatosplenomegaly Extremities: no clubbing, cyanosis or edema; 2+ radial, ulnar and brachial pulses bilaterally; 2+ right femoral, posterior tibial and dorsalis pedis pulses; 2+ left femoral, posterior tibial and dorsalis pedis pulses; no subclavian or femoral bruits Neurological: grossly nonfocal Psych: Normal mood and affect     Wt Readings from Last 3 Encounters:  06/13/19 184 lb (83.5 kg)  04/12/19 178 lb (80.7 kg)  04/02/19 181 lb 1.6 oz (82.1 kg)      Studies/Labs Reviewed:   EKG:  EKG is not ordered today.  04/12/2019 tracing shows mild sinus tachycardia, left atrial abnormality, left bundle branch block with left axis deviation  Recent Labs: 03/04/2019: B Natriuretic Peptide 1,041.5 03/06/2019: TSH 1.170 03/14/2019: Magnesium 2.5 03/19/2019: ALT 44 04/12/2019: BUN 63; Creatinine, Ser 12.48; Hemoglobin 10.9; Platelets 237; Potassium 5.5; Sodium 138   Lipid  Panel    Component Value Date/Time   CHOL 221 (H) 12/18/2018 0930   CHOL 197 12/31/2017 0945   TRIG 116 03/25/2019 0148   HDL 45 12/18/2018 0930   HDL 47 12/31/2017 0945   CHOLHDL 4.9 12/18/2018 0930   VLDL 12 05/22/2017 0926   LDLCALC 145 (H) 12/18/2018 0930     ASSESSMENT:    1. Paroxysmal atrial fibrillation (HCC)   2. Nonischemic dilated cardiomyopathy ([place])   3. Chronic combined  systolic and diastolic heart failure, NYHA class 3 ([place])   4. Coronary artery disease involving native coronary artery of native heart without angina pectoris   5. Mesenteric ischemia ([place])   6. Hemodialysis-associated hypotension   7. Chronic obstructive pulmonary disease, unspecified COPD type ([place])   8. ESRD on hemodialysis ([place])   9. 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 CHF: Very difficult to manage due to his problems with hypotension.  His current prescription for vasoconstrictors (Midodrine) on dialysis days is quite unorthodox, but is working well.  As far as I can tell, he is currently euvolemic. 2. CMP: He has primarily nonischemic cardiomyopathy.  Mild CAD by cardiac catheterization within the last couple of years. 3. CAD: Very little evidence of progression over time. 4. HLP: LDL cholesterol is high: Estimated prognosis is limited.  It is quite possible that the benefits of statin would be outweighed by the interactions with amiodarone and HIV medications. 5. AFib: On amiodarone without clinically apparent recurrence.  On warfarin anticoagulation. CHADSVasc4 (age 45, HF, history of hypertension) 6. LBBB: Unfortunately is not a candidate for CRT due to his problems with venous access for dialysis. 7. ESRD: Having slightly shorter dialysis sessions 4 days a week has worked tremendously well to achieve better volume control and to keep him out of the hospital. 8. HIV: Controlled, followed by Dr. Tommy Medal.  Undetectable viral load in March 9. Amiodarone: Normal TSH and liver function tests April 2020.   10. Warfarin: No serious bleeding complications.  Most recent INR 3.5. 11. He had one positive blood culture for actinomyces in April, probably contaminant but treated anyway in view of his multiple comorbid problems.  Medication Adjustments/Labs and Tests Ordered: Current medicines are reviewed at length with  the patient today.  Concerns regarding medicines are outlined above.  Medication changes, Labs and Tests ordered today are listed below. Patient Instructions  Medication Instructions:  Your physician recommends that you continue on your current medications as directed. Please refer to the Current Medication list given to you today.  If you need a refill on your cardiac medications before your next appointment, please call your pharmacy.   Lab work: NONE   Testing/Procedures: NONE  Follow-Up: At [facility], you and your health needs are our priority.  As part of our continuing mission to provide you with exceptional heart care, we have created designated Provider Care Teams.  These Care Teams include your primary Cardiologist (physician) and Advanced Practice Providers (APPs -  Physician Assistants and Nurse Practitioners) who all work together to provide you with the care you need, when you need it. You will need a follow up appointment in 6 months.  Please call our office 2 months in advance to schedule this appointment.  You may see [name], MD or one of the following Advanced Practice Providers on your designated Care Team: [place], [place] . Fabian Sharp, PA-C  Signed, [name], MD  06/13/2019 1:56 PM    [facility] [address], [place], [place]  [address] Phone: [phone]; Fax: [phone]

## 2019-06-14 DIAGNOSIS — N2581 Secondary hyperparathyroidism of renal origin: Secondary | ICD-10-CM | POA: Diagnosis not present

## 2019-06-14 DIAGNOSIS — N186 End stage renal disease: Secondary | ICD-10-CM | POA: Diagnosis not present

## 2019-06-14 DIAGNOSIS — J810 Acute pulmonary edema: Secondary | ICD-10-CM | POA: Diagnosis not present

## 2019-06-16 ENCOUNTER — Ambulatory Visit (INDEPENDENT_AMBULATORY_CARE_PROVIDER_SITE_OTHER): Payer: Medicare HMO | Admitting: Cardiology

## 2019-06-16 DIAGNOSIS — Z515 Encounter for palliative care: Secondary | ICD-10-CM

## 2019-06-16 DIAGNOSIS — N186 End stage renal disease: Secondary | ICD-10-CM | POA: Diagnosis not present

## 2019-06-16 DIAGNOSIS — I482 Chronic atrial fibrillation, unspecified: Secondary | ICD-10-CM

## 2019-06-16 DIAGNOSIS — J810 Acute pulmonary edema: Secondary | ICD-10-CM | POA: Diagnosis not present

## 2019-06-16 DIAGNOSIS — N2581 Secondary hyperparathyroidism of renal origin: Secondary | ICD-10-CM | POA: Diagnosis not present

## 2019-06-17 DIAGNOSIS — N186 End stage renal disease: Secondary | ICD-10-CM | POA: Diagnosis not present

## 2019-06-17 DIAGNOSIS — J810 Acute pulmonary edema: Secondary | ICD-10-CM | POA: Diagnosis not present

## 2019-06-17 DIAGNOSIS — N2581 Secondary hyperparathyroidism of renal origin: Secondary | ICD-10-CM | POA: Diagnosis not present

## 2019-06-18 ENCOUNTER — Ambulatory Visit (INDEPENDENT_AMBULATORY_CARE_PROVIDER_SITE_OTHER): Payer: Medicare HMO | Admitting: Family

## 2019-06-18 ENCOUNTER — Other Ambulatory Visit: Payer: Self-pay

## 2019-06-18 VITALS — BP 109/63 | HR 76 | Temp 97.8°F | Resp 16 | Wt 181.0 lb

## 2019-06-18 DIAGNOSIS — Z21 Asymptomatic human immunodeficiency virus [HIV] infection status: Secondary | ICD-10-CM

## 2019-06-18 DIAGNOSIS — N186 End stage renal disease: Secondary | ICD-10-CM | POA: Diagnosis not present

## 2019-06-18 DIAGNOSIS — M109 Gout, unspecified: Secondary | ICD-10-CM

## 2019-06-18 DIAGNOSIS — I480 Paroxysmal atrial fibrillation: Secondary | ICD-10-CM | POA: Diagnosis not present

## 2019-06-18 MED ORDER — FLUTICASONE PROPIONATE 50 MCG/ACT NA SUSP: 2.0000 | Freq: Every day | NASAL | 6 refills | Status: DC

## 2019-06-18 NOTE — Progress Notes (Signed)
Subjective:    Patient ID: David [name], male    DOB: Jul 05, 1941, 79 y.o.   MRN: [id number]  HPI  Patient is a 78 yr old male who presents today for follow up. He had multiple hospital visits (4) this past spring due to acute volume overload. Most recently, his hemodialysis has been increased from 3x a week to 4x a week. Since that time his volume status has been much more stable and he has been feeling better.  Atrial Fib- continues amiodarone, coumadin (which is managed by cardiology.  Gout- maintained on allopurinol. (currently prescribed by cardiology) Denies recent gout flares.    Review of Systems     Past Medical History:  Diagnosis Date  . 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 Acute on chronic systolic and diastolic heart failure, NYHA class 4 ([place])   . Anemia, iron deficiency 11/15/2011  . Arthritis    "hands, right knee, feet" (02/21/2018)  . Atrial fibrillation ([place])   . Cancer [facility])    hx of prostate; s/p radioactive seed implant 10/2009 Dr Janice Norrie  . Cardiac arrest ([place]) 02/17/2018  . CHF (congestive heart failure) ([place])   . Chronic combined systolic and diastolic heart failure, NYHA class 3 ([place]) 06/2010   felt to be secondary to hypertensive cardiomyopathy  . Chronic lower back pain   . CKD (chronic kidney disease) stage V requiring chronic dialysis ([place])   . ESRD on dialysis ([place])    started 02/2018  . GERD (gastroesophageal reflux disease)   . Glaucoma   . Gout    daily RX (02/21/2018)  . Heart murmur    "mild" per pt  . Hepatitis    years ago  . HIV (human immunodeficiency virus infection) ([place])   . HIV infection ([place])   . Hyperlipidemia   . Hypertension    followed by [facility] and Vascular (Dr Dani Gobble [place])  . Hypertension   . Nonischemic cardiomyopathy ([place])   . Pneumonia 11/2017  . Prostate cancer ([place])   . Sinus bradycardia   . Sleep apnea    does not use a cpap  . Stroke [facility])    "mini stroke" years ago     Social History    Socioeconomic History  . Marital status: Married    Spouse name: Not on file  . Number of children: 2  . Years of education: 28  . Highest education level: Not on file  Occupational History  . Occupation: retired, picks up Aeronautical engineer: RETIRED  Social Needs  . Financial resource strain: Not on file  . Food insecurity    Worry: Not on file    Inability: Not on file  . Transportation needs    Medical: Not on file    Non-medical: Not on file  Tobacco Use  . Smoking status: Former Smoker    Packs/day: 1.00    Years: 30.00    Pack years: 30.00    Types: Cigarettes    Quit date: 2006    Years since quitting: 14.5  . Smokeless tobacco: Never Used  Substance and Sexual Activity  . Alcohol use: Yes    Comment: once a week-wine  . Drug use: Never  . Sexual activity: Not Currently  Lifestyle  . Physical activity    Days per week: Not on file    Minutes per session: Not on file  . Stress: Not on file  Relationships  . Social connections    Talks on phone: Not on  file    Gets together: Not on file    Attends religious service: Not on file    Active member of club or organization: Not on file    Attends meetings of clubs or organizations: Not on file    Relationship status: Not on file  . Intimate partner violence    Fear of current or ex partner: Not on file    Emotionally abused: Not on file    Physically abused: Not on file    Forced sexual activity: Not on file  Other Topics Concern  . Not on file  Social History Narrative   ** Merged History Encounter **       Stays active at home Regular exercise: no Drinks 2 cups of coffee a week, 1 mountain dew soda a day.    Past Surgical History:  Procedure Laterality Date  . AV FISTULA PLACEMENT Right 10/13/2016   Procedure: ARTERIOVENOUS (AV) FISTULA CREATION;  Surgeon: [name], MD;  Location: [place];  Service: Vascular;  Laterality: Right;  . AV FISTULA PLACEMENT Left 02/25/2018   Procedure: INSERTION  OF ARTERIOVENOUS (AV) GORE-TEX GRAFT LEFT UPPER ARM;  Surgeon: Angelia Mould, MD;  Location: [place];  Service: Vascular;  Laterality: Left;  . AV FISTULA PLACEMENT Right 08/07/2018   Procedure: Creation of right arm brachiocephalic Fistula;  Surgeon: Waynetta Sandy, MD;  Location: [place];  Service: Vascular;  Laterality: Right;  . BASCILIC VEIN TRANSPOSITION Left 06/24/2015   Procedure: BASILIC VEIN TRANSPOSITION;  Surgeon: Mal Misty, MD;  Location: [place];  Service: Vascular;  Laterality: Left;  . BIOPSY  03/14/2019   Procedure: BIOPSY;  Surgeon: Irene Shipper, MD;  Location: [place];  Service: Endoscopy;;  . CATARACT EXTRACTION, BILATERAL Bilateral   . COLONOSCOPY WITH PROPOFOL N/A 03/14/2019   Procedure: COLONOSCOPY WITH PROPOFOL;  Surgeon: Irene Shipper, MD;  Location: [facility] ENDOSCOPY;  Service: Endoscopy;  Laterality: N/A;  . ESOPHAGOGASTRODUODENOSCOPY (EGD) WITH PROPOFOL N/A 05/05/2014   Procedure: ESOPHAGOGASTRODUODENOSCOPY (EGD) WITH PROPOFOL;  Surgeon: Missy Sabins, MD;  Location: [facility] ENDOSCOPY;  Service: Endoscopy;  Laterality: N/A;  . EYE SURGERY Bilateral    cataract surgery   . GLAUCOMA SURGERY Bilateral   . INSERTION OF ARTERIOVENOUS (AV) ARTEGRAFT ARM  10/09/2018   Procedure: INSERTION OF ARTERIOVENOUS (AV) 65mm x 41cm ARTEGRAFT LEFT UPPER ARM;  Surgeon: Waynetta Sandy, MD;  Location: [place];  Service: Vascular;;  . INSERTION PROSTATE RADIATION SEED    . IR FLUORO GUIDE CV LINE RIGHT  02/18/2018  . IR REMOVAL TUN CV CATH W/O FL  12/06/2018  . IR US GUIDE VASC ACCESS RIGHT  02/18/2018  . LEFT HEART CATH AND CORONARY ANGIOGRAPHY N/A 02/20/2018   Procedure: LEFT HEART CATH AND CORONARY ANGIOGRAPHY;  Surgeon: Jettie Booze, MD;  Location: [place] CV LAB;;; 50% ostial OM2, 25% mLAD.  Marland Kitchen LEFT HEART CATH AND CORONARY ANGIOGRAPHY  2004   mildly depressed LV systolic fx EF 93%,OIZTIW coronaries/abdominal aorta/renal arteries.  Marland Kitchen NM MYOCAR PERF WALL MOTION   02/21/2010   No ischemia or infarction.  EF 27%.  Marland Kitchen RADIOACTIVE SEED IMPLANT  2010   prostate cancer  . THROMBECTOMY W/ EMBOLECTOMY Left 02/26/2018   Procedure: THROMBECTOMY ARTERIOVENOUS GRAFT;  Surgeon: Angelia Mould, MD;  Location: [place];  Service: Vascular;  Laterality: Left;  . TRANSTHORACIC ECHOCARDIOGRAM  02/2012   [facility]) EF 45-50% with mild global HK.  Mild LVH,LA mod. dilated,mild-mod. MR & mitral annular ca+,mild TR,AOV  mildly sclerotic, mild tomod. AI.  Marland Kitchen TRANSTHORACIC ECHOCARDIOGRAM  9/'18; 3/'19   a) Severely reduced EF.  25 from 30%.  GR 1 DD with diffuse ecchymosis. Mild AS & AI.  Mild LA/RA dilation; b) (post PEA arest):   Moderately dilated LV with moderate concentric virtually.  EF 15 to 20%.  GR 1 DD.  Paradoxical septal motion. Mild AI.  Mild LA dilation.  Poorly visualized RV.  Marland Kitchen TRANSTHORACIC ECHOCARDIOGRAM  02/2019   EF 20-25%.  Unable to assess diastolic function (A. Fib).  No LV apical thrombus.  Mild AI.  Mildly reduced RV function, however poorly visualized.  Marland Kitchen ULTRASOUND GUIDANCE FOR VASCULAR ACCESS  02/20/2018   Procedure: Ultrasound Guidance For Vascular Access;  Surgeon: Jettie Booze, MD;  Location: [place] CV LAB;  Service: Cardiovascular;;  . UPPER EXTREMITY VENOGRAPHY N/A 07/08/2018   Procedure: UPPER EXTREMITY VENOGRAPHY;  Surgeon: Waynetta Sandy, MD;  Location: [place] CV LAB;  Service: Cardiovascular;  Laterality: N/A;  Bilateral    Family History  Problem Relation Age of Onset  . Hypertension Mother   . Thyroid disease Mother   . Cholelithiasis Daughter   . Cholelithiasis Son   . Hypertension Maternal Grandmother   . Diabetes Maternal Grandmother   . Heart attack Neg Hx   . Hyperlipidemia Neg Hx     Allergies  Allergen Reactions  . Cozaar [Losartan Potassium] Other (See Comments)    Causes constipation     Current Outpatient Medications on File Prior to Visit  Medication Sig Dispense Refill  . acetaminophen  (TYLENOL) 500 MG tablet Take 500-1,000 mg by mouth every 6 (six) hours as needed for mild pain.     Marland Kitchen allopurinol (ZYLOPRIM) 100 MG tablet TAKE 1 TABLET EVERY DAY (Patient taking differently: Take 100 mg by mouth daily. ) 90 tablet 1  . amiodarone (PACERONE) 200 MG tablet Take 1 tablet (200 mg total) by mouth daily. 90 tablet 3  . amoxicillin (AMOXIL) 500 MG capsule Take QDAILY in the am, then on dialysis days take ANOTHER dose AFTER HD 60 capsule 5  . AURYXIA 1 GM 210 MG(Fe) tablet Take 420 mg by mouth 3 (three) times daily with meals.     . bictegravir-emtricitabine-tenofovir AF ([name]) 50-200-25 MG TABS tablet Take 1 tablet by mouth daily. 30 tablet 11  . brimonidine (ALPHAGAN) 0.15 % ophthalmic solution Place 1 drop into the left eye 2 (two) times daily.  3  . calcitRIOL (ROCALTROL) 0.5 MCG capsule Take 1 capsule (0.5 mcg total) by mouth 3 (three) times a week.    . dorzolamide-timolol (COSOPT) 22.3-6.8 MG/ML ophthalmic solution Place 1 drop into the left eye 2 (two) times daily.    Marland Kitchen latanoprost (XALATAN) 0.005 % ophthalmic solution Place 1 drop into the left eye at bedtime.     Marland Kitchen loratadine (CLARITIN) 10 MG tablet Take 1 tablet (10 mg total) by mouth daily. 30 tablet 11  . midodrine (PROAMATINE) 10 MG tablet Take 1 tablet (10 mg total) by mouth as directed. Twice a day on the day of HD Tuesday, Thursday, Saturday (1 in AM and 1 at [place]). Take 1/2 tablet (5mg ) a day on non HD days 90 tablet 5  . multivitamin (RENA-VIT) TABS tablet Take 1 tablet by mouth daily.    Marland Kitchen warfarin (COUMADIN) 5 MG tablet Take 2 tablets by mouth daily or as directed by coumadin clinic (Patient taking differently: Take 10 mg by mouth daily. ) 60 tablet 1   No current  facility-administered medications on file prior to visit.     BP 109/63 (BP Location: Left Arm, Patient Position: Sitting, Cuff Size: Small)   Pulse 76   Temp 97.8 F (36.6 C) (Oral)   Resp 16   Wt 181 lb (82.1 kg)   SpO2 100%   BMI 23.24 kg/m     Objective:   Physical Exam Constitutional:      General: He is not in acute distress.    Appearance: He is well-developed.  HENT:     Head: Normocephalic and atraumatic.  Cardiovascular:     Rate and Rhythm: Normal rate and regular rhythm.     Heart sounds: No murmur.  Pulmonary:     Effort: Pulmonary effort is normal. No respiratory distress.     Breath sounds: Normal breath sounds. No wheezing or rales.  Skin:    General: Skin is warm and dry.  Neurological:     Mental Status: He is alert and oriented to person, place, and time.  Psychiatric:        Behavior: Behavior normal.        Thought Content: Thought content normal.           Assessment & Plan:  Gout- given HD status, will change his allopurinol to 100mg  3x weekly following hemodialysis. He is currently taking once daily.   Atrial fibrillation- rate stable.  Monitor.   ESRD- improved volume status with 4x weekly HD.  Management per renal.  HIV- Viral load is undetected.  Management per ID.

## 2019-06-18 NOTE — Patient Instructions (Addendum)
Please add flonase 2 sprays each nostril once daily. Continue claritin.

## 2019-06-19 DIAGNOSIS — N2581 Secondary hyperparathyroidism of renal origin: Secondary | ICD-10-CM | POA: Diagnosis not present

## 2019-06-19 DIAGNOSIS — N186 End stage renal disease: Secondary | ICD-10-CM | POA: Diagnosis not present

## 2019-06-19 DIAGNOSIS — J810 Acute pulmonary edema: Secondary | ICD-10-CM | POA: Diagnosis not present

## 2019-06-20 ENCOUNTER — Telehealth: Payer: Self-pay | Admitting: Family

## 2019-06-20 MED ORDER — ALLOPURINOL 100 MG PO TABS: ORAL_TABLET | ORAL | 1 refills | Status: DC

## 2019-06-20 NOTE — Telephone Encounter (Signed)
Please contact pt and let him know that I reviewed the dosing of his amiodarone. Since he is on dialysis I would like for him to change to 1 tablet 3 times weekly after dialysis only.

## 2019-06-21 DIAGNOSIS — J810 Acute pulmonary edema: Secondary | ICD-10-CM | POA: Diagnosis not present

## 2019-06-21 DIAGNOSIS — S41101A Unspecified open wound of right upper arm, initial encounter: Secondary | ICD-10-CM | POA: Insufficient documentation

## 2019-06-21 DIAGNOSIS — N186 End stage renal disease: Secondary | ICD-10-CM | POA: Diagnosis not present

## 2019-06-21 DIAGNOSIS — N2581 Secondary hyperparathyroidism of renal origin: Secondary | ICD-10-CM | POA: Diagnosis not present

## 2019-06-23 DIAGNOSIS — N186 End stage renal disease: Secondary | ICD-10-CM | POA: Diagnosis not present

## 2019-06-23 DIAGNOSIS — N2581 Secondary hyperparathyroidism of renal origin: Secondary | ICD-10-CM | POA: Diagnosis not present

## 2019-06-23 DIAGNOSIS — J810 Acute pulmonary edema: Secondary | ICD-10-CM | POA: Diagnosis not present

## 2019-06-23 NOTE — Telephone Encounter (Signed)
Called patient a few times but no answer, left detailed voice mail with information and for patient to call me back and let me know he received message or if he has any questions.

## 2019-06-24 DIAGNOSIS — J810 Acute pulmonary edema: Secondary | ICD-10-CM | POA: Diagnosis not present

## 2019-06-24 DIAGNOSIS — N2581 Secondary hyperparathyroidism of renal origin: Secondary | ICD-10-CM | POA: Diagnosis not present

## 2019-06-24 DIAGNOSIS — N186 End stage renal disease: Secondary | ICD-10-CM | POA: Diagnosis not present

## 2019-06-24 NOTE — Telephone Encounter (Signed)
Left detailed message again today with this information and for patient to call me back and let me know he received it.

## 2019-06-26 DIAGNOSIS — N186 End stage renal disease: Secondary | ICD-10-CM | POA: Diagnosis not present

## 2019-06-26 DIAGNOSIS — N2581 Secondary hyperparathyroidism of renal origin: Secondary | ICD-10-CM | POA: Diagnosis not present

## 2019-06-26 DIAGNOSIS — J810 Acute pulmonary edema: Secondary | ICD-10-CM | POA: Diagnosis not present

## 2019-06-26 LAB — PROTIME-INR: INR: 2.1 — AB (ref 0.9–1.1)

## 2019-06-28 DIAGNOSIS — N186 End stage renal disease: Secondary | ICD-10-CM | POA: Diagnosis not present

## 2019-06-28 DIAGNOSIS — J810 Acute pulmonary edema: Secondary | ICD-10-CM | POA: Diagnosis not present

## 2019-06-28 DIAGNOSIS — N2581 Secondary hyperparathyroidism of renal origin: Secondary | ICD-10-CM | POA: Diagnosis not present

## 2019-06-30 ENCOUNTER — Ambulatory Visit (INDEPENDENT_AMBULATORY_CARE_PROVIDER_SITE_OTHER): Payer: Medicare HMO | Admitting: Pharmacist

## 2019-06-30 DIAGNOSIS — I482 Chronic atrial fibrillation, unspecified: Secondary | ICD-10-CM

## 2019-06-30 DIAGNOSIS — Z7901 Long term (current) use of anticoagulants: Secondary | ICD-10-CM | POA: Diagnosis not present

## 2019-06-30 DIAGNOSIS — Z515 Encounter for palliative care: Secondary | ICD-10-CM

## 2019-06-30 DIAGNOSIS — N186 End stage renal disease: Secondary | ICD-10-CM | POA: Diagnosis not present

## 2019-06-30 DIAGNOSIS — J810 Acute pulmonary edema: Secondary | ICD-10-CM | POA: Diagnosis not present

## 2019-06-30 DIAGNOSIS — N2581 Secondary hyperparathyroidism of renal origin: Secondary | ICD-10-CM | POA: Diagnosis not present

## 2019-07-01 DIAGNOSIS — N186 End stage renal disease: Secondary | ICD-10-CM | POA: Diagnosis not present

## 2019-07-01 DIAGNOSIS — J810 Acute pulmonary edema: Secondary | ICD-10-CM | POA: Diagnosis not present

## 2019-07-01 DIAGNOSIS — N2581 Secondary hyperparathyroidism of renal origin: Secondary | ICD-10-CM | POA: Diagnosis not present

## 2019-07-02 ENCOUNTER — Encounter: Payer: Self-pay | Admitting: Family

## 2019-07-02 ENCOUNTER — Encounter: Payer: Self-pay | Admitting: *Deleted

## 2019-07-02 ENCOUNTER — Other Ambulatory Visit: Payer: Self-pay

## 2019-07-02 ENCOUNTER — Telehealth: Payer: Self-pay | Admitting: *Deleted

## 2019-07-02 ENCOUNTER — Ambulatory Visit (INDEPENDENT_AMBULATORY_CARE_PROVIDER_SITE_OTHER): Payer: Medicare HMO | Admitting: Family

## 2019-07-02 VITALS — BP 113/68 | HR 95 | Temp 97.5°F | Resp 18 | Ht 74.0 in | Wt 182.0 lb

## 2019-07-02 DIAGNOSIS — Z992 Dependence on renal dialysis: Secondary | ICD-10-CM | POA: Diagnosis not present

## 2019-07-02 DIAGNOSIS — N186 End stage renal disease: Secondary | ICD-10-CM

## 2019-07-02 DIAGNOSIS — T82590A Other mechanical complication of surgically created arteriovenous fistula, initial encounter: Secondary | ICD-10-CM | POA: Diagnosis not present

## 2019-07-02 NOTE — Progress Notes (Signed)
CC: Evaluation of non healing scab at AV graft right upper arm  History of Present Illness  [facility] is a 78 y.o. (1941/09/15) male who is s/p right upper arm brachial to axillary AV graft with 6 mm Artegraft on 10-09-18 by Dr. Donzetta Matters. He has had multiple previous left upper extremity accesses and failed a right arm for stage basilic vein fistula,  indicated for right arm grafting.  He returns today at the request of Dr. Pearson Grippe for non healing scab at right upper arm AVF. Pt states the needles are placed in the same two spots at every HD session, and that sometimes clots are extracted at the more proximal stick site. He denies any problems with bleeding from either stick site. He denies any steal type symptoms in his right upper extremity. He denies fever or chills. He states that he is taking a po antibiotic for this.   Pt dialyzes on Monday, Tuesday, Thursday, and Saturday via right upper arm AV Graft.  He is right hand dominant.   He takes warfarin, has a hx of atrial fib   Past Medical History:  Diagnosis Date  . 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 Acute on chronic systolic and diastolic heart failure, NYHA class 4 ([place])   . Anemia, iron deficiency 11/15/2011  . Arthritis    "hands, right knee, feet" (02/21/2018)  . Atrial fibrillation ([place])   . Cancer [facility])    hx of prostate; s/p radioactive seed implant 10/2009 Dr Janice Norrie  . Cardiac arrest ([place]) 02/17/2018  . CHF (congestive heart failure) ([place])   . Chronic combined systolic and diastolic heart failure, NYHA class 3 ([place]) 06/2010   felt to be secondary to hypertensive cardiomyopathy  . Chronic lower back pain   . CKD (chronic kidney disease) stage V requiring chronic dialysis ([place])   . ESRD on dialysis ([place])    started 02/2018  . GERD (gastroesophageal reflux disease)   . Glaucoma   . Gout    daily RX (02/21/2018)  . Heart murmur    "mild" per pt  . Hepatitis    years ago  . HIV (human immunodeficiency  virus infection) ([place])   . HIV infection ([place])   . Hyperlipidemia   . Hypertension    followed by [facility] and Vascular (Dr Dani Gobble Croitoru)  . Hypertension   . Nonischemic cardiomyopathy ([place])   . Pneumonia 11/2017  . Prostate cancer ([place])   . Sinus bradycardia   . Sleep apnea    does not use a cpap  . Stroke [facility])    "mini stroke" years ago    Social History Social History   Tobacco Use  . Smoking status: Former Smoker    Packs/day: 1.00    Years: 30.00    Pack years: 30.00    Types: Cigarettes    Quit date: 2006    Years since quitting: 14.5  . Smokeless tobacco: Never Used  Substance Use Topics  . Alcohol use: Yes    Comment: once a week-wine  . Drug use: Never    Family History Family History  Problem Relation Age of Onset  . Hypertension Mother   . Thyroid disease Mother   . Cholelithiasis Daughter   . Cholelithiasis Son   . Hypertension Maternal Grandmother   . Diabetes Maternal Grandmother   . Heart attack Neg Hx   . Hyperlipidemia Neg Hx     Surgical History Past Surgical History:  Procedure Laterality Date  . AV  FISTULA PLACEMENT Right 10/13/2016   Procedure: ARTERIOVENOUS (AV) FISTULA CREATION;  Surgeon: [name], MD;  Location: [place];  Service: Vascular;  Laterality: Right;  . AV FISTULA PLACEMENT Left 02/25/2018   Procedure: INSERTION OF ARTERIOVENOUS (AV) GORE-TEX GRAFT LEFT UPPER ARM;  Surgeon: Angelia Mould, MD;  Location: [place];  Service: Vascular;  Laterality: Left;  . AV FISTULA PLACEMENT Right 08/07/2018   Procedure: Creation of right arm brachiocephalic Fistula;  Surgeon: Waynetta Sandy, MD;  Location: [place];  Service: Vascular;  Laterality: Right;  . BASCILIC VEIN TRANSPOSITION Left 06/24/2015   Procedure: BASILIC VEIN TRANSPOSITION;  Surgeon: Mal Misty, MD;  Location: [place];  Service: Vascular;  Laterality: Left;  . BIOPSY  03/14/2019   Procedure: BIOPSY;  Surgeon: Irene Shipper, MD;  Location: [place];  Service: Endoscopy;;  . CATARACT EXTRACTION, BILATERAL Bilateral   . COLONOSCOPY WITH PROPOFOL N/A 03/14/2019   Procedure: COLONOSCOPY WITH PROPOFOL;  Surgeon: Irene Shipper, MD;  Location: [facility] ENDOSCOPY;  Service: Endoscopy;  Laterality: N/A;  . ESOPHAGOGASTRODUODENOSCOPY (EGD) WITH PROPOFOL N/A 05/05/2014   Procedure: ESOPHAGOGASTRODUODENOSCOPY (EGD) WITH PROPOFOL;  Surgeon: Missy Sabins, MD;  [facility] ENDOSCOPY;  Service: Endoscopy;  Laterality: N/A;  . EYE SURGERY Bilateral    cataract surgery   . GLAUCOMA SURGERY Bilateral   . INSERTION OF ARTERIOVENOUS (AV) ARTEGRAFT ARM  10/09/2018   Procedure: INSERTION OF ARTERIOVENOUS (AV) 39mm x 41cm ARTEGRAFT LEFT UPPER ARM;  Surgeon: Waynetta Sandy, MD;  Location: [place];  Service: Vascular;;  . INSERTION PROSTATE RADIATION SEED    . IR FLUORO GUIDE CV LINE RIGHT  02/18/2018  . IR REMOVAL TUN CV CATH W/O FL  12/06/2018  . IR US GUIDE VASC ACCESS RIGHT  02/18/2018  . LEFT HEART CATH AND CORONARY ANGIOGRAPHY N/A 02/20/2018   Procedure: LEFT HEART CATH AND CORONARY ANGIOGRAPHY;  Surgeon: Jettie Booze, MD;  Location: [place] CV LAB;;; 50% ostial OM2, 25% mLAD.  Marland Kitchen LEFT HEART CATH AND CORONARY ANGIOGRAPHY  2004   mildly depressed LV systolic fx EF 62%,ZHYQMV coronaries/abdominal aorta/renal arteries.  Marland Kitchen NM MYOCAR PERF WALL MOTION  02/21/2010   No ischemia or infarction.  EF 27%.  Marland Kitchen RADIOACTIVE SEED IMPLANT  2010   prostate cancer  . THROMBECTOMY W/ EMBOLECTOMY Left 02/26/2018   Procedure: THROMBECTOMY ARTERIOVENOUS GRAFT;  Surgeon: Angelia Mould, MD;  Location: [place];  Service: Vascular;  Laterality: Left;  . TRANSTHORACIC ECHOCARDIOGRAM  02/2012   [facility]) EF 45-50% with mild global HK.  Mild LVH,LA mod. dilated,mild-mod. 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 AI.  Marland Kitchen TRANSTHORACIC ECHOCARDIOGRAM  9/'18; 3/'19   a) Severely reduced EF.  25 from 30%.  GR 1 DD with diffuse ecchymosis. Mild  AS & AI.  Mild LA/RA dilation; b) (post PEA arest):   Moderately dilated LV with moderate concentric virtually.  EF 15 to 20%.  GR 1 DD.  Paradoxical septal motion. Mild AI.  Mild LA dilation.  Poorly visualized RV.  Marland Kitchen TRANSTHORACIC ECHOCARDIOGRAM  02/2019   EF 20-25%.  Unable to assess diastolic function (A. Fib).  No LV apical thrombus.  Mild AI.  Mildly reduced RV function, however poorly visualized.  Marland Kitchen ULTRASOUND GUIDANCE FOR VASCULAR ACCESS  02/20/2018   Procedure: Ultrasound Guidance For Vascular Access;  Surgeon: Jettie Booze, MD;  Location: [place] CV LAB;  Service: Cardiovascular;;  . UPPER EXTREMITY VENOGRAPHY N/A 07/08/2018   Procedure: UPPER EXTREMITY  VENOGRAPHY;  Surgeon: Waynetta Sandy, MD;  Location: [place] CV LAB;  Service: Cardiovascular;  Laterality: N/A;  Bilateral    Allergies  Allergen Reactions  . Cozaar [Losartan Potassium] Other (See Comments)    Causes constipation     Current Outpatient Medications  Medication Sig Dispense Refill  . acetaminophen (TYLENOL) 500 MG tablet Take 500-1,000 mg by mouth every 6 (six) hours as needed for mild pain.     Marland Kitchen allopurinol (ZYLOPRIM) 100 MG tablet 1 tablet by mouth 3x weekly after dialysis. 90 tablet 1  . amiodarone (PACERONE) 200 MG tablet Take 1 tablet (200 mg total) by mouth daily. 90 tablet 3  . amoxicillin (AMOXIL) 500 MG capsule Take QDAILY in the am, then on dialysis days take ANOTHER dose AFTER HD 60 capsule 5  . AURYXIA 1 GM 210 MG(Fe) tablet Take 420 mg by mouth 3 (three) times daily with meals.     . bictegravir-emtricitabine-tenofovir AF ([name]) 50-200-25 MG TABS tablet Take 1 tablet by mouth daily. 30 tablet 11  . brimonidine (ALPHAGAN) 0.15 % ophthalmic solution Place 1 drop into the left eye 2 (two) times daily.  3  . calcitRIOL (ROCALTROL) 0.5 MCG capsule Take 1 capsule (0.5 mcg total) by mouth 3 (three) times a week.    . dorzolamide-timolol (COSOPT) 22.3-6.8 MG/ML ophthalmic solution  Place 1 drop into the left eye 2 (two) times daily.    . fluticasone (FLONASE) 50 MCG/ACT nasal spray Place 2 sprays into both nostrils daily. 16 g 6  . latanoprost (XALATAN) 0.005 % ophthalmic solution Place 1 drop into the left eye at bedtime.     Marland Kitchen loratadine (CLARITIN) 10 MG tablet Take 1 tablet (10 mg total) by mouth daily. 30 tablet 11  . midodrine (PROAMATINE) 10 MG tablet Take 1 tablet (10 mg total) by mouth as directed. Twice a day on the day of HD Tuesday, Thursday, Saturday (1 in AM and 1 at [place]). Take 1/2 tablet (5mg ) a day on non HD days 90 tablet 5  . multivitamin (RENA-VIT) TABS tablet Take 1 tablet by mouth daily.    Marland Kitchen warfarin (COUMADIN) 5 MG tablet Take 2 tablets by mouth daily or as directed by coumadin clinic (Patient taking differently: Take 10 mg by mouth daily. ) 60 tablet 1   No current facility-administered medications for this visit.      REVIEW OF SYSTEMS: see HPI for pertinent positives and negatives    PHYSICAL EXAMINATION:  Vitals:   07/02/19 1027  BP: 113/68  Pulse: 95  Resp: 18  Temp: (!) 97.5 F (36.4 C)  TempSrc: Temporal  SpO2: 100%  Weight: 182 lb (82.6 kg)  Height: 6\' 2"  (1.88 m)   Body mass index is 23.37 kg/m.  General: The patient appears his stated age.   HEENT:  No gross abnormalities Pulmonary: Respirations are non-labored Abdomen: Soft and non-tender  Musculoskeletal: There are no major deformities.   Neurologic: No focal weakness or paresthesias are detected Skin: See photo below. Psychiatric: The patient has normal affect. Cardiovascular: There is a regular rate and rhythm Bilateral radial pulses are 1+ palpable. Right upper arm AVF with palpable thrill and audible bruit. Skin over the obvious only two needle access sites do not feel thin   Right upper arm AV graft, non healing wound at more proximal of the two stick sites.     Medical Decision Making  [place] is a 78 y.o. male who is s/p right upper arm  brachial  to axillary AV graft with 6 mm Artegraft on 10-09-18 by Dr. Donzetta Matters. I spoke with Dr. One[place] Alar re the non healing scab of the proximal stick site of AVG, and that pt is taking PO antibx. Will need to schedule excision of proximal portion of AVG that has the non healing scab, replace with a piece of graft material. This would need to be scheduled on a Wednesday or Friday, his non HD days.   He is taking warfarin.    Clemon Chambers, RN, MSN, FNP-C [facility] Office: [phone]  07/02/2019, 10:50 AM  Clinic DL:OPRAFOA, Oneida Alar

## 2019-07-02 NOTE — Telephone Encounter (Signed)
Lm again today

## 2019-07-02 NOTE — H&P (View-Only) (Signed)
CC: Evaluation of non healing scab at AV graft right upper arm  History of Present Illness  [facility] [place] is a 78 y.o. (1940/12/17) male who is s/p right upper arm brachial to axillary AV graft with 6 mm Artegraft on 10-09-18 by Dr. Donzetta Matters. He has had multiple previous left upper extremity accesses and failed a right arm for stage basilic vein fistula,  indicated for right arm grafting.  He returns today at the request of Dr. Pearson Grippe for non healing scab at right upper arm AVF. Pt states the needles are placed in the same two spots at every HD session, and that sometimes clots are extracted at the more proximal stick site. He denies any problems with bleeding from either stick site. He denies any steal type symptoms in his right upper extremity. He denies fever or chills. He states that he is taking a po antibiotic for this.   Pt dialyzes on Monday, Tuesday, Thursday, and Saturday via right upper arm AV Graft.  He is right hand dominant.   He takes warfarin, has a hx of atrial fib   Past Medical History:  Diagnosis Date  . 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 Acute on chronic systolic and diastolic heart failure, NYHA class 4 ([place])   . Anemia, iron deficiency 11/15/2011  . Arthritis    "hands, right knee, feet" (02/21/2018)  . Atrial fibrillation ([place])   . Cancer [facility])    hx of prostate; s/p radioactive seed implant 10/2009 Dr Janice Norrie  . Cardiac arrest ([place]) 02/17/2018  . CHF (congestive heart failure) ([place])   . Chronic combined systolic and diastolic heart failure, NYHA class 3 ([place]) 06/2010   felt to be secondary to hypertensive cardiomyopathy  . Chronic lower back pain   . CKD (chronic kidney disease) stage V requiring chronic dialysis ([place])   . ESRD on dialysis ([place])    started 02/2018  . GERD (gastroesophageal reflux disease)   . Glaucoma   . Gout    daily RX (02/21/2018)  . Heart murmur    "mild" per pt  . Hepatitis    years ago  . HIV (human immunodeficiency  virus infection) ([place])   . HIV infection ([place])   . Hyperlipidemia   . Hypertension    followed by [facility] and Vascular (Dr Dani Gobble Croitoru)  . Hypertension   . Nonischemic cardiomyopathy ([place])   . Pneumonia 11/2017  . Prostate cancer ([place])   . Sinus bradycardia   . Sleep apnea    does not use a cpap  . Stroke [facility])    "mini stroke" years ago    Social History Social History   Tobacco Use  . Smoking status: Former Smoker    Packs/day: 1.00    Years: 30.00    Pack years: 30.00    Types: Cigarettes    Quit date: 2006    Years since quitting: 14.5  . Smokeless tobacco: Never Used  Substance Use Topics  . Alcohol use: Yes    Comment: once a week-wine  . Drug use: Never    Family History Family History  Problem Relation Age of Onset  . Hypertension Mother   . Thyroid disease Mother   . Cholelithiasis Daughter   . Cholelithiasis Son   . Hypertension Maternal Grandmother   . Diabetes Maternal Grandmother   . Heart attack Neg Hx   . Hyperlipidemia Neg Hx     Surgical History Past Surgical History:  Procedure Laterality Date  . AV  FISTULA PLACEMENT Right 10/13/2016   Procedure: ARTERIOVENOUS (AV) FISTULA CREATION;  Surgeon: [name], MD;  Location: [place];  Service: Vascular;  Laterality: Right;  . AV FISTULA PLACEMENT Left 02/25/2018   Procedure: INSERTION OF ARTERIOVENOUS (AV) GORE-TEX GRAFT LEFT UPPER ARM;  Surgeon: Angelia Mould, MD;  Location: [place];  Service: Vascular;  Laterality: Left;  . AV FISTULA PLACEMENT Right 08/07/2018   Procedure: Creation of right arm brachiocephalic Fistula;  Surgeon: Waynetta Sandy, MD;  Location: [place];  Service: Vascular;  Laterality: Right;  . BASCILIC VEIN TRANSPOSITION Left 06/24/2015   Procedure: BASILIC VEIN TRANSPOSITION;  Surgeon: Mal Misty, MD;  Location: [place];  Service: Vascular;  Laterality: Left;  . BIOPSY  03/14/2019   Procedure: BIOPSY;  Surgeon: Irene Shipper, MD;  Location: [place];  Service: Endoscopy;;  . CATARACT EXTRACTION, BILATERAL Bilateral   . COLONOSCOPY WITH PROPOFOL N/A 03/14/2019   Procedure: COLONOSCOPY WITH PROPOFOL;  Surgeon: Irene Shipper, MD;  Location: [facility] ENDOSCOPY;  Service: Endoscopy;  Laterality: N/A;  . ESOPHAGOGASTRODUODENOSCOPY (EGD) WITH PROPOFOL N/A 05/05/2014   Procedure: ESOPHAGOGASTRODUODENOSCOPY (EGD) WITH PROPOFOL;  Surgeon: Missy Sabins, MD;  Location: [facility] ENDOSCOPY;  Service: Endoscopy;  Laterality: N/A;  . EYE SURGERY Bilateral    cataract surgery   . GLAUCOMA SURGERY Bilateral   . INSERTION OF ARTERIOVENOUS (AV) ARTEGRAFT ARM  10/09/2018   Procedure: INSERTION OF ARTERIOVENOUS (AV) 56mm x 41cm ARTEGRAFT LEFT UPPER ARM;  Surgeon: Waynetta Sandy, MD;  Location: [place];  Service: Vascular;;  . INSERTION PROSTATE RADIATION SEED    . IR FLUORO GUIDE CV LINE RIGHT  02/18/2018  . IR REMOVAL TUN CV CATH W/O FL  12/06/2018  . IR US GUIDE VASC ACCESS RIGHT  02/18/2018  . LEFT HEART CATH AND CORONARY ANGIOGRAPHY N/A 02/20/2018   Procedure: LEFT HEART CATH AND CORONARY ANGIOGRAPHY;  Surgeon: Jettie Booze, MD;  Location: [place] CV LAB;;; 50% ostial OM2, 25% mLAD.  Marland Kitchen LEFT HEART CATH AND CORONARY ANGIOGRAPHY  2004   mildly depressed LV systolic fx EF 28%,NOMVEH coronaries/abdominal aorta/renal arteries.  Marland Kitchen NM MYOCAR PERF WALL MOTION  02/21/2010   No ischemia or infarction.  EF 27%.  Marland Kitchen RADIOACTIVE SEED IMPLANT  2010   prostate cancer  . THROMBECTOMY W/ EMBOLECTOMY Left 02/26/2018   Procedure: THROMBECTOMY ARTERIOVENOUS GRAFT;  Surgeon: Angelia Mould, MD;  Location: [place];  Service: Vascular;  Laterality: Left;  . TRANSTHORACIC ECHOCARDIOGRAM  02/2012   [facility]) EF 45-50% with mild global HK.  Mild LVH,LA mod. dilated,mild-mod. 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 AI.  Marland Kitchen TRANSTHORACIC ECHOCARDIOGRAM  9/'18; 3/'19   a) Severely reduced EF.  25 from 30%.  GR 1 DD with diffuse ecchymosis. Mild  AS & AI.  Mild LA/RA dilation; b) (post PEA arest):   Moderately dilated LV with moderate concentric virtually.  EF 15 to 20%.  GR 1 DD.  Paradoxical septal motion. Mild AI.  Mild LA dilation.  Poorly visualized RV.  Marland Kitchen TRANSTHORACIC ECHOCARDIOGRAM  02/2019   EF 20-25%.  Unable to assess diastolic function (A. Fib).  No LV apical thrombus.  Mild AI.  Mildly reduced RV function, however poorly visualized.  Marland Kitchen ULTRASOUND GUIDANCE FOR VASCULAR ACCESS  02/20/2018   Procedure: Ultrasound Guidance For Vascular Access;  Surgeon: Jettie Booze, MD;  Location: [place] CV LAB;  Service: Cardiovascular;;  . UPPER EXTREMITY VENOGRAPHY N/A 07/08/2018   Procedure: UPPER EXTREMITY  VENOGRAPHY;  Surgeon: Waynetta Sandy, MD;  Location: [place] CV LAB;  Service: Cardiovascular;  Laterality: N/A;  Bilateral    Allergies  Allergen Reactions  . Cozaar [Losartan Potassium] Other (See Comments)    Causes constipation     Current Outpatient Medications  Medication Sig Dispense Refill  . acetaminophen (TYLENOL) 500 MG tablet Take 500-1,000 mg by mouth every 6 (six) hours as needed for mild pain.     Marland Kitchen allopurinol (ZYLOPRIM) 100 MG tablet 1 tablet by mouth 3x weekly after dialysis. 90 tablet 1  . amiodarone (PACERONE) 200 MG tablet Take 1 tablet (200 mg total) by mouth daily. 90 tablet 3  . amoxicillin (AMOXIL) 500 MG capsule Take QDAILY in the am, then on dialysis days take ANOTHER dose AFTER HD 60 capsule 5  . AURYXIA 1 GM 210 MG(Fe) tablet Take 420 mg by mouth 3 (three) times daily with meals.     . bictegravir-emtricitabine-tenofovir AF ([name]) 50-200-25 MG TABS tablet Take 1 tablet by mouth daily. 30 tablet 11  . brimonidine (ALPHAGAN) 0.15 % ophthalmic solution Place 1 drop into the left eye 2 (two) times daily.  3  . calcitRIOL (ROCALTROL) 0.5 MCG capsule Take 1 capsule (0.5 mcg total) by mouth 3 (three) times a week.    . dorzolamide-timolol (COSOPT) 22.3-6.8 MG/ML ophthalmic solution  Place 1 drop into the left eye 2 (two) times daily.    . fluticasone (FLONASE) 50 MCG/ACT nasal spray Place 2 sprays into both nostrils daily. 16 g 6  . latanoprost (XALATAN) 0.005 % ophthalmic solution Place 1 drop into the left eye at bedtime.     Marland Kitchen loratadine (CLARITIN) 10 MG tablet Take 1 tablet (10 mg total) by mouth daily. 30 tablet 11  . midodrine (PROAMATINE) 10 MG tablet Take 1 tablet (10 mg total) by mouth as directed. Twice a day on the day of HD Tuesday, Thursday, Saturday (1 in AM and 1 at [place]). Take 1/2 tablet (5mg ) a day on non HD days 90 tablet 5  . multivitamin (RENA-VIT) TABS tablet Take 1 tablet by mouth daily.    Marland Kitchen warfarin (COUMADIN) 5 MG tablet Take 2 tablets by mouth daily or as directed by coumadin clinic (Patient taking differently: Take 10 mg by mouth daily. ) 60 tablet 1   No current facility-administered medications for this visit.      REVIEW OF SYSTEMS: see HPI for pertinent positives and negatives    PHYSICAL EXAMINATION:  Vitals:   07/02/19 1027  BP: 113/68  Pulse: 95  Resp: 18  Temp: (!) 97.5 F (36.4 C)  TempSrc: Temporal  SpO2: 100%  Weight: 182 lb (82.6 kg)  Height: 6\' 2"  (1.88 m)   Body mass index is 23.37 kg/m.  General: The patient appears his stated age.   HEENT:  No gross abnormalities Pulmonary: Respirations are non-labored Abdomen: Soft and non-tender  Musculoskeletal: There are no major deformities.   Neurologic: No focal weakness or paresthesias are detected Skin: See photo below. Psychiatric: The patient has normal affect. Cardiovascular: There is a regular rate and rhythm Bilateral radial pulses are 1+ palpable. Right upper arm AVF with palpable thrill and audible bruit. Skin over the obvious only two needle access sites do not feel thin   Right upper arm AV graft, non healing wound at more proximal of the two stick sites.     Medical Decision Making  [place] is a 78 y.o. male who is s/p right upper arm  brachial  to axillary AV graft with 6 mm Artegraft on 10-09-18 by Dr. Donzetta Matters. I spoke with Dr. Oneida Alar re the non healing scab of the proximal stick site of AVG, and that pt is taking PO antibx. Will need to schedule excision of proximal portion of AVG that has the non healing scab, replace with a piece of graft material. This would need to be scheduled on a Wednesday or Friday, his non HD days.   He is taking warfarin.    Clemon Chambers, RN, MSN, FNP-C [facility] [place] Office: [phone]  07/02/2019, 10:50 AM  Clinic PZ:ZCKICHT, Oneida Alar

## 2019-07-02 NOTE — Telephone Encounter (Signed)
Pt takes warfarin for afib with CHADS2VASc score of 6 (age x2, CHF, CAD, mini stroke reported years ago). HTN also listed on PMH but pt not on any BP lowering meds and is taking midodrine to avoid hypotension.  Dr Sallyanne Kuster previously cleared pt to hold warfarin for 5 days without Lovenox on 09/13/18 clearance. Ok to hold warfarin for 3 days without bridging for upcoming fistula revision based on prior cardiologist recommendation.

## 2019-07-02 NOTE — Telephone Encounter (Signed)
   Primary Cardiologist: [name], MD  Chart reviewed as part of pre-operative protocol coverage. As below, based on ACC/AHA guidelines, [facility] would be at acceptable risk for the planned procedure without further cardiovascular testing. Per pharmD review, "Dr Sallyanne Kuster previously cleared pt to hold warfarin for 5 days without Lovenox on 09/13/18 clearance. Ok to hold warfarin for 3 days without bridging for upcoming fistula revision based on prior cardiologist recommendation."  I will route this recommendation to the requesting party via [place] fax function and remove from pre-op pool. Will also cc to [facility] in [place].  Please call with questions.  Charlie Pitter, PA-C 07/02/2019, 4:35 PM

## 2019-07-02 NOTE — Telephone Encounter (Signed)
   Primary Cardiologist: [name], MD  Chart reviewed as part of pre-operative protocol coverage. Patient was contacted 07/02/2019 in reference to pre-operative risk assessment for pending surgery as outlined below.  [facility] David [name] was last seen on 06/04/19 by 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 EF 20-25% by echo 02/2019. He has had numerous issues with dialysis access. He was seen by [facility] with nonhealing scab at proximal stick site of AVG prompting revision required below. Although by history he is at higher risk in general, do not see that any cardiac intervention would change need for this procedure therefore the patient would be at acceptable risk for the planned procedure without further cardiovascular testing.   Will route to pharm for assistance with Coumadin.  Charlie Pitter, PA-C 07/02/2019, 1:10 PM

## 2019-07-02 NOTE — Telephone Encounter (Signed)
Request for Surgical Clearance  1. What type of surgery is being performed?  Revision of Arteriovenous fistula right arm    2. When is this surgery scheduled? 07/11/2019  3. What type of clearance is required (medical clearance vs. Pharmacy clearance to hold med vs. Both)?     Pharmacy clearance for medication hold.     4. Are there any medications that need to be held prior to surgery and how long?  Coumadin hold for 3 days pre-op.    5. Practice name and name of physician performing surgery?   [facility] Dr. Scot Dock    6.  What is your office phone number? [phone]    7. What is your office fax number? (Be sure to include anyone who it needs to go Attn to) [phone] attn [id number]    8. Anesthesia type (None, local, MAC, general)? MAC   REMINDER TO USER: Remember to please route this message to P CV DIV PREOP in a phone note.

## 2019-07-03 DIAGNOSIS — J810 Acute pulmonary edema: Secondary | ICD-10-CM | POA: Diagnosis not present

## 2019-07-03 DIAGNOSIS — N2581 Secondary hyperparathyroidism of renal origin: Secondary | ICD-10-CM | POA: Diagnosis not present

## 2019-07-03 DIAGNOSIS — N186 End stage renal disease: Secondary | ICD-10-CM | POA: Diagnosis not present

## 2019-07-03 NOTE — Telephone Encounter (Signed)
Have call patient several times with no answer, left messages but patient has not call back. Letter mailed to address on file with this information.

## 2019-07-04 DIAGNOSIS — B20 Human immunodeficiency virus [HIV] disease: Secondary | ICD-10-CM | POA: Diagnosis not present

## 2019-07-04 DIAGNOSIS — N186 End stage renal disease: Secondary | ICD-10-CM | POA: Diagnosis not present

## 2019-07-04 DIAGNOSIS — Z992 Dependence on renal dialysis: Secondary | ICD-10-CM | POA: Diagnosis not present

## 2019-07-05 DIAGNOSIS — Z992 Dependence on renal dialysis: Secondary | ICD-10-CM | POA: Diagnosis not present

## 2019-07-05 DIAGNOSIS — I953 Hypotension of hemodialysis: Secondary | ICD-10-CM | POA: Diagnosis not present

## 2019-07-05 DIAGNOSIS — N186 End stage renal disease: Secondary | ICD-10-CM | POA: Diagnosis not present

## 2019-07-05 DIAGNOSIS — I132 Hypertensive heart and chronic kidney disease with heart failure and with stage 5 chronic kidney disease, or end stage renal disease: Secondary | ICD-10-CM | POA: Diagnosis not present

## 2019-07-05 DIAGNOSIS — E8770 Fluid overload, unspecified: Secondary | ICD-10-CM | POA: Diagnosis not present

## 2019-07-05 DIAGNOSIS — J810 Acute pulmonary edema: Secondary | ICD-10-CM | POA: Diagnosis not present

## 2019-07-05 DIAGNOSIS — I5041 Acute combined systolic (congestive) and diastolic (congestive) heart failure: Secondary | ICD-10-CM | POA: Diagnosis not present

## 2019-07-05 DIAGNOSIS — N2581 Secondary hyperparathyroidism of renal origin: Secondary | ICD-10-CM | POA: Diagnosis not present

## 2019-07-07 DIAGNOSIS — N186 End stage renal disease: Secondary | ICD-10-CM | POA: Diagnosis not present

## 2019-07-07 DIAGNOSIS — N2581 Secondary hyperparathyroidism of renal origin: Secondary | ICD-10-CM | POA: Diagnosis not present

## 2019-07-07 DIAGNOSIS — I5041 Acute combined systolic (congestive) and diastolic (congestive) heart failure: Secondary | ICD-10-CM | POA: Diagnosis not present

## 2019-07-07 DIAGNOSIS — J810 Acute pulmonary edema: Secondary | ICD-10-CM | POA: Diagnosis not present

## 2019-07-07 DIAGNOSIS — I132 Hypertensive heart and chronic kidney disease with heart failure and with stage 5 chronic kidney disease, or end stage renal disease: Secondary | ICD-10-CM | POA: Diagnosis not present

## 2019-07-07 DIAGNOSIS — E8770 Fluid overload, unspecified: Secondary | ICD-10-CM | POA: Diagnosis not present

## 2019-07-07 DIAGNOSIS — I953 Hypotension of hemodialysis: Secondary | ICD-10-CM | POA: Diagnosis not present

## 2019-07-07 DIAGNOSIS — Z992 Dependence on renal dialysis: Secondary | ICD-10-CM | POA: Diagnosis not present

## 2019-07-08 ENCOUNTER — Other Ambulatory Visit (HOSPITAL_COMMUNITY)
Admission: RE | Admit: 2019-07-08 | Discharge: 2019-07-08 | Disposition: A | Discharge status: Home / Self Care | Payer: Medicare HMO | Source: Ambulatory Visit | Attending: Vascular Surgery | Admitting: Vascular Surgery

## 2019-07-08 ENCOUNTER — Other Ambulatory Visit: Payer: Self-pay

## 2019-07-08 ENCOUNTER — Encounter (HOSPITAL_COMMUNITY): Payer: Self-pay | Admitting: *Deleted

## 2019-07-08 DIAGNOSIS — Z20828 Contact with and (suspected) exposure to other viral communicable diseases: Secondary | ICD-10-CM | POA: Insufficient documentation

## 2019-07-08 DIAGNOSIS — J810 Acute pulmonary edema: Secondary | ICD-10-CM | POA: Diagnosis not present

## 2019-07-08 DIAGNOSIS — N186 End stage renal disease: Secondary | ICD-10-CM | POA: Diagnosis not present

## 2019-07-08 DIAGNOSIS — N2581 Secondary hyperparathyroidism of renal origin: Secondary | ICD-10-CM | POA: Diagnosis not present

## 2019-07-08 DIAGNOSIS — I132 Hypertensive heart and chronic kidney disease with heart failure and with stage 5 chronic kidney disease, or end stage renal disease: Secondary | ICD-10-CM | POA: Diagnosis not present

## 2019-07-08 DIAGNOSIS — I953 Hypotension of hemodialysis: Secondary | ICD-10-CM | POA: Diagnosis not present

## 2019-07-08 DIAGNOSIS — Z01812 Encounter for preprocedural laboratory examination: Secondary | ICD-10-CM | POA: Insufficient documentation

## 2019-07-08 DIAGNOSIS — Z992 Dependence on renal dialysis: Secondary | ICD-10-CM | POA: Diagnosis not present

## 2019-07-08 DIAGNOSIS — I5041 Acute combined systolic (congestive) and diastolic (congestive) heart failure: Secondary | ICD-10-CM | POA: Diagnosis not present

## 2019-07-08 DIAGNOSIS — E8770 Fluid overload, unspecified: Secondary | ICD-10-CM | POA: Diagnosis not present

## 2019-07-08 LAB — SARS CORONAVIRUS 2 (TAT 6-24 HRS): SARS Coronavirus 2: NEGATIVE

## 2019-07-10 ENCOUNTER — Telehealth: Payer: Self-pay | Admitting: *Deleted

## 2019-07-10 DIAGNOSIS — I132 Hypertensive heart and chronic kidney disease with heart failure and with stage 5 chronic kidney disease, or end stage renal disease: Secondary | ICD-10-CM | POA: Diagnosis not present

## 2019-07-10 DIAGNOSIS — I953 Hypotension of hemodialysis: Secondary | ICD-10-CM | POA: Diagnosis not present

## 2019-07-10 DIAGNOSIS — I5041 Acute combined systolic (congestive) and diastolic (congestive) heart failure: Secondary | ICD-10-CM | POA: Diagnosis not present

## 2019-07-10 DIAGNOSIS — N2581 Secondary hyperparathyroidism of renal origin: Secondary | ICD-10-CM | POA: Diagnosis not present

## 2019-07-10 DIAGNOSIS — J810 Acute pulmonary edema: Secondary | ICD-10-CM | POA: Diagnosis not present

## 2019-07-10 DIAGNOSIS — Z992 Dependence on renal dialysis: Secondary | ICD-10-CM | POA: Diagnosis not present

## 2019-07-10 DIAGNOSIS — N186 End stage renal disease: Secondary | ICD-10-CM | POA: Diagnosis not present

## 2019-07-10 DIAGNOSIS — E8770 Fluid overload, unspecified: Secondary | ICD-10-CM | POA: Diagnosis not present

## 2019-07-10 NOTE — Anesthesia Preprocedure Evaluation (Addendum)
Anesthesia Evaluation  Patient identified by MRN, date of birth, ID band Patient awake    Reviewed: Allergy & Precautions, NPO status , Patient's Chart, lab work & pertinent test results  Airway Mallampati: II  TM Distance: >3 FB     Dental   Pulmonary pneumonia, COPD, former smoker,    breath sounds clear to auscultation       Cardiovascular hypertension, + CAD and +CHF  + dysrhythmias + Valvular Problems/Murmurs  Rhythm:Regular Rate:Normal     Neuro/Psych    GI/Hepatic PUD, GERD  ,(+) Hepatitis -  Endo/Other    Renal/GU Renal disease     Musculoskeletal   Abdominal   Peds  Hematology   Anesthesia Other Findings   Reproductive/Obstetrics                            Anesthesia Physical Anesthesia Plan  ASA: III  Anesthesia Plan: MAC   Post-op Pain Management:    Induction: Intravenous  PONV Risk Score and Plan: 1 and Midazolam  Airway Management Planned: Simple Face Mask and Nasal Cannula  Additional Equipment:   Intra-op Plan:   Post-operative Plan:   Informed Consent: I have reviewed the patients History and Physical, chart, labs and discussed the procedure including the risks, benefits and alternatives for the proposed anesthesia with the patient or authorized representative who has indicated his/her understanding and acceptance.     Dental advisory given  Plan Discussed with: CRNA and Anesthesiologist  Anesthesia Plan Comments:        Anesthesia Quick Evaluation

## 2019-07-10 NOTE — Telephone Encounter (Signed)
Please tell him not to change the way he takes his amiodarone. Continue to take it daily.

## 2019-07-10 NOTE — Telephone Encounter (Signed)
Call to [place] and spoke to [facility]. Patient there now for HD.  Instructed to have patient arrive at [facility] admitting at 7:30 am on 07/11/2019 for surgery with Dr. Trula Slade.

## 2019-07-10 NOTE — Telephone Encounter (Signed)
Phone call and confirmed with patient 7:30 am arrival time at [facility] admitting on 07/11/2019 and Coumadin hold x 3 days.

## 2019-07-10 NOTE — Telephone Encounter (Signed)
The patient has been called and made aware. He verbalized his understanding.

## 2019-07-10 NOTE — Telephone Encounter (Signed)
The patient came into the office with a letter on instructions about his Amiodarone. He stated that he is concerned since [name] is the one that prescribed it and is his cardiologist. He would like to verify that this is okay for him.   Dear David [name],  I reviewed the dosing of amiodarone. Since you are on dialysis I would like for you to change to 1 tablet 3 times weekly after dialysis only.  If you have any questions or concerns, please don't hesitate to call.  Sincerely,  [name] FNP

## 2019-07-11 ENCOUNTER — Encounter (HOSPITAL_COMMUNITY): Payer: Self-pay | Admitting: Surgery

## 2019-07-11 ENCOUNTER — Other Ambulatory Visit: Payer: Self-pay

## 2019-07-11 ENCOUNTER — Ambulatory Visit (HOSPITAL_COMMUNITY)
Admission: RE | Admit: 2019-07-11 | Discharge: 2019-07-11 | Disposition: A | Discharge status: Home / Self Care | Payer: Medicare HMO | Attending: Surgery | Admitting: Surgery

## 2019-07-11 ENCOUNTER — Encounter (HOSPITAL_COMMUNITY)
Admission: RE | Disposition: A | Discharge status: Home / Self Care | Payer: Self-pay | Source: Home / Self Care | Attending: Surgery

## 2019-07-11 ENCOUNTER — Ambulatory Visit (HOSPITAL_COMMUNITY): Payer: Medicare HMO | Admitting: Anesthesiology

## 2019-07-11 DIAGNOSIS — M1990 Unspecified osteoarthritis, unspecified site: Secondary | ICD-10-CM | POA: Diagnosis not present

## 2019-07-11 DIAGNOSIS — G4730 Sleep apnea, unspecified: Secondary | ICD-10-CM | POA: Insufficient documentation

## 2019-07-11 DIAGNOSIS — Z7901 Long term (current) use of anticoagulants: Secondary | ICD-10-CM | POA: Diagnosis not present

## 2019-07-11 DIAGNOSIS — G8929 Other chronic pain: Secondary | ICD-10-CM | POA: Diagnosis not present

## 2019-07-11 DIAGNOSIS — M109 Gout, unspecified: Secondary | ICD-10-CM | POA: Diagnosis not present

## 2019-07-11 DIAGNOSIS — Z8674 Personal history of sudden cardiac arrest: Secondary | ICD-10-CM | POA: Diagnosis not present

## 2019-07-11 DIAGNOSIS — T82898A Other specified complication of vascular prosthetic devices, implants and grafts, initial encounter: Secondary | ICD-10-CM | POA: Insufficient documentation

## 2019-07-11 DIAGNOSIS — M545 Low back pain: Secondary | ICD-10-CM | POA: Diagnosis not present

## 2019-07-11 DIAGNOSIS — Z8673 Personal history of transient ischemic attack (TIA), and cerebral infarction without residual deficits: Secondary | ICD-10-CM | POA: Insufficient documentation

## 2019-07-11 DIAGNOSIS — I4891 Unspecified atrial fibrillation: Secondary | ICD-10-CM | POA: Insufficient documentation

## 2019-07-11 DIAGNOSIS — E785 Hyperlipidemia, unspecified: Secondary | ICD-10-CM | POA: Diagnosis not present

## 2019-07-11 DIAGNOSIS — Z8546 Personal history of malignant neoplasm of prostate: Secondary | ICD-10-CM | POA: Insufficient documentation

## 2019-07-11 DIAGNOSIS — Z992 Dependence on renal dialysis: Secondary | ICD-10-CM

## 2019-07-11 DIAGNOSIS — K219 Gastro-esophageal reflux disease without esophagitis: Secondary | ICD-10-CM | POA: Insufficient documentation

## 2019-07-11 DIAGNOSIS — I5023 Acute on chronic systolic (congestive) heart failure: Secondary | ICD-10-CM | POA: Diagnosis not present

## 2019-07-11 DIAGNOSIS — Z87891 Personal history of nicotine dependence: Secondary | ICD-10-CM | POA: Insufficient documentation

## 2019-07-11 DIAGNOSIS — Z79899 Other long term (current) drug therapy: Secondary | ICD-10-CM | POA: Diagnosis not present

## 2019-07-11 DIAGNOSIS — I132 Hypertensive heart and chronic kidney disease with heart failure and with stage 5 chronic kidney disease, or end stage renal disease: Secondary | ICD-10-CM | POA: Insufficient documentation

## 2019-07-11 DIAGNOSIS — N186 End stage renal disease: Secondary | ICD-10-CM

## 2019-07-11 DIAGNOSIS — Y832 Surgical operation with anastomosis, bypass or graft as the cause of abnormal reaction of the patient, or of later complication, without mention of misadventure at the time of the procedure: Secondary | ICD-10-CM | POA: Insufficient documentation

## 2019-07-11 DIAGNOSIS — T82868A Thrombosis of vascular prosthetic devices, implants and grafts, initial encounter: Secondary | ICD-10-CM | POA: Diagnosis not present

## 2019-07-11 HISTORY — PX: REVISION OF ARTERIOVENOUS GORETEX GRAFT: SHX6073

## 2019-07-11 LAB — GLUCOSE, CAPILLARY: Glucose-Capillary: 94 mg/dL (ref 70–99)

## 2019-07-11 LAB — POCT I-STAT 4, (NA,K, GLUC, HGB,HCT)
Glucose, Bld: 96 mg/dL (ref 70–99)
HCT: 40 % (ref 39.0–52.0)
Hemoglobin: 13.6 g/dL (ref 13.0–17.0)
Potassium: 4.8 mmol/L (ref 3.5–5.1)
Sodium: 137 mmol/L (ref 135–145)

## 2019-07-11 LAB — PROTIME-INR
INR: 1.6 — ABNORMAL HIGH (ref 0.8–1.2)
Prothrombin Time: 18.5 seconds — ABNORMAL HIGH (ref 11.4–15.2)

## 2019-07-11 SURGERY — REVISION OF ARTERIOVENOUS GORETEX GRAFT
Anesthesia: Monitor Anesthesia Care | Site: Arm Upper | Laterality: Right

## 2019-07-11 MED ORDER — CEFAZOLIN SODIUM-DEXTROSE 2-4 GM/100ML-% IV SOLN
INTRAVENOUS | Status: AC
  Filled 2019-07-11: qty 100

## 2019-07-11 MED ORDER — LIDOCAINE HCL (CARDIAC) PF 100 MG/5ML IV SOSY
PREFILLED_SYRINGE | INTRAVENOUS | Status: DC | PRN
  Administered 2019-07-11: 30 mg via INTRAVENOUS

## 2019-07-11 MED ORDER — CEFAZOLIN SODIUM-DEXTROSE 2-4 GM/100ML-% IV SOLN
2.0000 g | INTRAVENOUS | Status: AC
  Administered 2019-07-11: 10:00:00 2 g via INTRAVENOUS

## 2019-07-11 MED ORDER — PHENYLEPHRINE 40 MCG/ML (10ML) SYRINGE FOR IV PUSH (FOR BLOOD PRESSURE SUPPORT)
PREFILLED_SYRINGE | INTRAVENOUS | Status: AC
  Filled 2019-07-11: qty 10

## 2019-07-11 MED ORDER — HEMOSTATIC AGENTS (NO CHARGE) OPTIME
TOPICAL | Status: DC | PRN
  Administered 2019-07-11: 1 via TOPICAL

## 2019-07-11 MED ORDER — ALBUMIN HUMAN 5 % IV SOLN
INTRAVENOUS | Status: DC | PRN
  Administered 2019-07-11: 11:00:00 via INTRAVENOUS

## 2019-07-11 MED ORDER — FENTANYL CITRATE (PF) 100 MCG/2ML IJ SOLN
INTRAMUSCULAR | Status: DC | PRN
  Administered 2019-07-11 (×3): 25 ug via INTRAVENOUS

## 2019-07-11 MED ORDER — HEPARIN SODIUM (PORCINE) 1000 UNIT/ML IJ SOLN
INTRAMUSCULAR | Status: DC | PRN
  Administered 2019-07-11: 5000 [IU] via INTRAVENOUS

## 2019-07-11 MED ORDER — CHLORHEXIDINE GLUCONATE 4 % EX LIQD: 60.0000 mL | Freq: Once | CUTANEOUS | Status: DC

## 2019-07-11 MED ORDER — SODIUM CHLORIDE 0.9 % IR SOLN
Status: DC | PRN
  Administered 2019-07-11: 1000 mL

## 2019-07-11 MED ORDER — HEPARIN SODIUM (PORCINE) 1000 UNIT/ML IJ SOLN
INTRAMUSCULAR | Status: AC
  Filled 2019-07-11: qty 1

## 2019-07-11 MED ORDER — SODIUM CHLORIDE 0.9 % IV SOLN
INTRAVENOUS | Status: DC
  Administered 2019-07-11: 09:00:00 via INTRAVENOUS

## 2019-07-11 MED ORDER — PHENYLEPHRINE HCL (PRESSORS) 10 MG/ML IV SOLN
INTRAVENOUS | Status: DC | PRN
  Administered 2019-07-11 (×2): 80 ug via INTRAVENOUS

## 2019-07-11 MED ORDER — HYDROMORPHONE HCL 1 MG/ML IJ SOLN: 0.2500 mg | INTRAMUSCULAR | Status: DC | PRN

## 2019-07-11 MED ORDER — OXYCODONE-ACETAMINOPHEN 5-325 MG PO TABS: 1.0000 | ORAL_TABLET | Freq: Four times a day (QID) | ORAL | 0 refills | Status: DC | PRN

## 2019-07-11 MED ORDER — PROPOFOL 500 MG/50ML IV EMUL
INTRAVENOUS | Status: DC | PRN
  Administered 2019-07-11: 50 ug/kg/min via INTRAVENOUS

## 2019-07-11 MED ORDER — PROTAMINE SULFATE 10 MG/ML IV SOLN
INTRAVENOUS | Status: DC | PRN
  Administered 2019-07-11 (×5): 10 mg via INTRAVENOUS

## 2019-07-11 MED ORDER — SODIUM CHLORIDE 0.9 % IV SOLN
INTRAVENOUS | Status: AC
  Filled 2019-07-11: qty 1.2

## 2019-07-11 MED ORDER — PROPOFOL 10 MG/ML IV BOLUS
INTRAVENOUS | Status: AC
  Filled 2019-07-11: qty 40

## 2019-07-11 MED ORDER — LIDOCAINE-EPINEPHRINE (PF) 1 %-1:200000 IJ SOLN
INTRAMUSCULAR | Status: AC
  Filled 2019-07-11: qty 60

## 2019-07-11 MED ORDER — FENTANYL CITRATE (PF) 250 MCG/5ML IJ SOLN
INTRAMUSCULAR | Status: AC
  Filled 2019-07-11: qty 5

## 2019-07-11 MED ORDER — ONDANSETRON HCL 4 MG/2ML IJ SOLN
INTRAMUSCULAR | Status: DC | PRN
  Administered 2019-07-11: 4 mg via INTRAVENOUS

## 2019-07-11 MED ORDER — LIDOCAINE-EPINEPHRINE (PF) 1 %-1:200000 IJ SOLN
INTRAMUSCULAR | Status: DC | PRN
  Administered 2019-07-11: 19 mL

## 2019-07-11 MED ORDER — SODIUM CHLORIDE 0.9 % IV SOLN
INTRAVENOUS | Status: DC | PRN
  Administered 2019-07-11: 500 mL

## 2019-07-11 MED ORDER — ONDANSETRON HCL 4 MG/2ML IJ SOLN
INTRAMUSCULAR | Status: AC
  Filled 2019-07-11: qty 2

## 2019-07-11 MED ORDER — PROTAMINE SULFATE 10 MG/ML IV SOLN
INTRAVENOUS | Status: AC
  Filled 2019-07-11: qty 5

## 2019-07-11 SURGICAL SUPPLY — 44 items
ADH SKN CLS APL DERMABOND .7 (GAUZE/BANDAGES/DRESSINGS) ×1
CANISTER SUCT 3000ML PPV (MISCELLANEOUS) ×2 IMPLANT
CATH EMB 4FR 40CM (CATHETERS) ×1 IMPLANT
CATH EMB 5FR 80CM (CATHETERS) ×1 IMPLANT
CLIP VESOCCLUDE MED 6/CT (CLIP) ×2 IMPLANT
CLIP VESOCCLUDE SM WIDE 6/CT (CLIP) ×2 IMPLANT
COVER WAND RF STERILE (DRAPES) ×1 IMPLANT
DERMABOND ADVANCED (GAUZE/BANDAGES/DRESSINGS) ×1
DERMABOND ADVANCED .7 DNX12 (GAUZE/BANDAGES/DRESSINGS) ×1 IMPLANT
ELECT REM PT RETURN 9FT ADLT (ELECTROSURGICAL) ×2
ELECTRODE REM PT RTRN 9FT ADLT (ELECTROSURGICAL) ×1 IMPLANT
GLOVE BIOGEL PI IND STRL 6.5 (GLOVE) IMPLANT
GLOVE BIOGEL PI IND STRL 7.5 (GLOVE) ×1 IMPLANT
GLOVE BIOGEL PI INDICATOR 6.5 (GLOVE) ×3
GLOVE BIOGEL PI INDICATOR 7.5 (GLOVE) ×1
GLOVE ECLIPSE 7.5 STRL STRAW (GLOVE) ×1 IMPLANT
GLOVE SURG SS PI 6.5 STRL IVOR (GLOVE) ×1 IMPLANT
GLOVE SURG SS PI 7.5 STRL IVOR (GLOVE) ×2 IMPLANT
GOWN STRL NON-REIN LRG LVL3 (GOWN DISPOSABLE) ×1 IMPLANT
GOWN STRL REUS W/ TWL LRG LVL3 (GOWN DISPOSABLE) ×2 IMPLANT
GOWN STRL REUS W/ TWL XL LVL3 (GOWN DISPOSABLE) ×1 IMPLANT
GOWN STRL REUS W/TWL LRG LVL3 (GOWN DISPOSABLE) ×4
GOWN STRL REUS W/TWL XL LVL3 (GOWN DISPOSABLE) ×2
GRAFT COLLAGEN VASCULAR 6X15 (Vascular Products) ×1 IMPLANT
GRAFT COLLAGEN VASCULAR 6X19 (Vascular Products) IMPLANT
HEMOSTAT SNOW SURGICEL 2X4 (HEMOSTASIS) IMPLANT
KIT BASIN OR (CUSTOM PROCEDURE TRAY) ×2 IMPLANT
KIT TURNOVER KIT B (KITS) ×2 IMPLANT
NDL HYPO 25GX1X1/2 BEV (NEEDLE) ×1 IMPLANT
NEEDLE HYPO 25GX1X1/2 BEV (NEEDLE) ×2 IMPLANT
NS IRRIG 1000ML POUR BTL (IV SOLUTION) ×2 IMPLANT
PACK CV ACCESS (CUSTOM PROCEDURE TRAY) ×2 IMPLANT
PAD ARMBOARD 7.5X6 YLW CONV (MISCELLANEOUS) ×4 IMPLANT
SPONGE LAP 18X18 RF (DISPOSABLE) ×1 IMPLANT
SUT PROLENE 5 0 C 1 24 (SUTURE) ×3 IMPLANT
SUT PROLENE 6 0 BV (SUTURE) ×4 IMPLANT
SUT VIC AB 3-0 SH 27 (SUTURE) ×2
SUT VIC AB 3-0 SH 27X BRD (SUTURE) ×2 IMPLANT
SUT VICRYL 4-0 PS2 18IN ABS (SUTURE) ×1 IMPLANT
SYR 3ML LL SCALE MARK (SYRINGE) ×2 IMPLANT
SYR TOOMEY 50ML (SYRINGE) ×1 IMPLANT
TOWEL GREEN STERILE (TOWEL DISPOSABLE) ×2 IMPLANT
UNDERPAD 30X30 (UNDERPADS AND DIAPERS) ×2 IMPLANT
WATER STERILE IRR 1000ML POUR (IV SOLUTION) ×2 IMPLANT

## 2019-07-11 NOTE — Anesthesia Procedure Notes (Signed)
Procedure Name: MAC Date/Time: 07/11/2019 9:38 AM Performed by: Eligha Bridegroom, CRNA Pre-anesthesia Checklist: Patient identified, Emergency Drugs available, Suction available, Patient being monitored and Timeout performed Patient Re-evaluated:Patient Re-evaluated prior to induction Oxygen Delivery Method: Nasal cannula Preoxygenation: Pre-oxygenation with 100% oxygen Induction Type: IV induction

## 2019-07-11 NOTE — Discharge Instructions (Signed)
° °  [facility] [place] ° °Discharge Instructions ° °AV Fistula or Graft Surgery for Dialysis Access ° °Please refer to the following instructions for your post-procedure care. Your surgeon or physician assistant will discuss any changes with you. ° °Activity ° °You may drive the day following your surgery, if you are comfortable and no longer taking prescription pain medication. Resume full activity as the soreness in your incision resolves. ° °Bathing/Showering ° °You may shower after you go home. Keep your incision dry for 48 hours. Do not soak in a bathtub, hot tub, or swim until the incision heals completely. You may not shower if you have a hemodialysis catheter. ° °Incision Care ° °Clean your incision with mild soap and water after 48 hours. Pat the area dry with a clean towel. You do not need a bandage unless otherwise instructed. Do not apply any ointments or creams to your incision. You may have skin glue on your incision. Do not peel it off. It will come off on its own in about one week. Your arm may swell a bit after surgery. To reduce swelling use pillows to elevate your arm so it is above your heart. Your doctor will tell you if you need to lightly wrap your arm with an ACE bandage. ° °Diet ° °Resume your normal diet. There are not special food restrictions following this procedure. In order to heal from your surgery, it is CRITICAL to get adequate nutrition. Your body requires vitamins, minerals, and protein. Vegetables are the best source of vitamins and minerals. Vegetables also provide the perfect balance of protein. Processed food has little nutritional value, so try to avoid this. ° °Medications ° °Resume taking all of your medications. If your incision is causing pain, you may take over-the counter pain relievers such as acetaminophen (Tylenol). If you were prescribed a stronger pain medication, please be aware these medications can cause nausea and constipation. Prevent  nausea by taking the medication with a snack or meal. Avoid constipation by drinking plenty of fluids and eating foods with high amount of fiber, such as fruits, vegetables, and grains. Do not take Tylenol if you are taking prescription pain medications. ° ° ° ° °Follow up °Your surgeon may want to see you in the office following your access surgery. If so, this will be arranged at the time of your surgery. ° °Please call us immediately for any of the following conditions: ° °Increased pain, redness, drainage (pus) from your incision site °Fever of 101 degrees or higher °Severe or worsening pain at your incision site °Hand pain or numbness. ° °Reduce your risk of vascular disease: ° °Stop smoking. If you would like help, call [facility] at 1-800-QUIT-NOW (1-800-784-8669) or [place] at 336-586-4000 ° °Manage your cholesterol °Maintain a desired weight °Control your diabetes °Keep your blood pressure down ° °Dialysis ° °It will take several weeks to several months for your new dialysis access to be ready for use. Your surgeon will determine when it is OK to use it. Your nephrologist will continue to direct your dialysis. You can continue to use your Permcath until your new access is ready for use. ° °If you have any questions, please call the office at [phone]. ° °

## 2019-07-11 NOTE — Transfer of Care (Signed)
Immediate Anesthesia Transfer of Care Note  Patient: [facility]  Procedure(s) Performed: REVISION OF ARTERIOVENOUS GORETEX GRAFT RIGHT ARM (Right Arm Upper)  Patient Location: PACU  Anesthesia Type:MAC  Level of Consciousness: awake, alert  and oriented  Airway & Oxygen Therapy: Patient Spontanous Breathing  Post-op Assessment: Report given to RN and Post -op Vital signs reviewed and stable  Post vital signs: Reviewed and stable  Last Vitals:  Vitals Value Taken Time  BP 101/70 07/11/19 1141  Temp 36.7 C 07/11/19 1141  Pulse 66 07/11/19 1145  Resp 16 07/11/19 1145  SpO2 98 % 07/11/19 1145  Vitals shown include unvalidated device data.  Last Pain:  Vitals:   07/11/19 0735  TempSrc:   PainSc: 0-No pain      Patients Stated Pain Goal: 4 (30/13/14 3888)  Complications: No apparent anesthesia complications

## 2019-07-11 NOTE — Op Note (Signed)
° ° °  Patient name: Davidson Palmieri MRN: [id number] DOB: 01-28-1941 Sex: male  07/11/2019 Pre-operative Diagnosis: ESRD, right arm graft pseudoaneurysm Post-operative diagnosis:  Same Surgeon:  Annamarie Major Assistants:  Karrie Meres Procedure:   Revision of right upper arm dialysis graft using a 6 mm Artegraft interposition Anesthesia: MAC Blood Loss: 100 cc Specimens: None  Findings: 2 large defects were identified in the graft corresponding with the cannulation site.  I elected to resect this portion and place a interposition graft with 6 mm Artegraft  Indications: Patient has undergone a right upper arm dialysis graft using Artegraft.  He has developed an eschar over top of an aneurysmal area.  He comes in today for revision  Procedure:  The patient was identified in the holding area and taken to [place] 16  The patient was then placed supine on the table. MAC anesthesia was administered.  The patient was prepped and draped in the usual sterile fashion.  A time out was called and antibiotics were administered.  1% lidocaine was used for local anesthesia.  An elliptical incision was made over top of the eschar on the graft.  Cautery was used about subcutaneous tissue down to the graft.  A large pseudoaneurysm was encountered.  I tried to get proximal control of the graft however could not identify the normal graft and so I elected to open up the incision more proximally, incorporating the other cannulation site.  Once I got back to this area I was able to dissect out normal Artegraft.  I then went back distally and got to a normal-appearing graft.  Once it had control I was able to further dissect out and mobilized the graft.  It appears that there were 2 large pseudoaneurysms coming from the cannulation sites that look like they were connected.  I heparinized the patient and then occluded the graft.  Once I opened the pseudoaneurysms 2 large defects within the graft were identified.  I  felt the best option was to replace this portion of the graft.  A 6 mm Artegraft was brought on the table.  After was prepared properly, a end to end anastomosis was performed to the proximal and distal retained graft.  I did pass a Fogarty catheters proximally and distally.  They went without resistance.  There was no thrombus.  The anastomosis was then completed and blood flow was reestablished to the graft.  There was an excellent thrill.  50 mg of protamine was given.  Once hemostasis was satisfactory the incision was closed with 2 layers of Vicryl followed by Dermabond.  There were no immediate complications.   Disposition: To PACU stable.   Theotis Burrow, M.D., [facility] [place] Office: [phone] Pager:  [phone]

## 2019-07-11 NOTE — Anesthesia Postprocedure Evaluation (Signed)
Anesthesia Post Note  Patient: [facility]  Procedure(s) Performed: REVISION OF ARTERIOVENOUS GORETEX GRAFT RIGHT ARM (Right Arm Upper)     Patient location during evaluation: PACU Anesthesia Type: MAC Level of consciousness: awake Pain management: pain level controlled Vital Signs Assessment: post-procedure vital signs reviewed and stable Respiratory status: spontaneous breathing Cardiovascular status: stable Postop Assessment: no apparent nausea or vomiting Anesthetic complications: no    Last Vitals:  Vitals:   07/11/19 0726  BP: 138/62  Pulse: 95  Resp: 18  Temp: 36.9 C  SpO2: 99%    Last Pain:  Vitals:   07/11/19 0735  TempSrc:   PainSc: 0-No pain                 Elanah Osmanovic

## 2019-07-11 NOTE — Interval H&P Note (Signed)
History and Physical Interval Note:  07/11/2019 7:26 AM  [place]  has presented today for surgery, with the diagnosis of COMPLICATION OF ARTERIOVENOUS GRAFT RIGHT ARM.  The various methods of treatment have been discussed with the patient and family. After consideration of risks, benefits and other options for treatment, the patient has consented to  Procedure(s): REVISION OF ARTERIOVENOUS GORETEX GRAFT RIGHT ARM (Right) as a surgical intervention.  The patient's history has been reviewed, patient examined, no change in status, stable for surgery.  I have reviewed the patient's chart and labs.  Questions were answered to the patient's satisfaction.     Annamarie Major

## 2019-07-12 ENCOUNTER — Other Ambulatory Visit: Payer: Self-pay | Admitting: Cardiovascular Disease

## 2019-07-14 ENCOUNTER — Ambulatory Visit (HOSPITAL_COMMUNITY): Payer: Medicare HMO | Admitting: Certified Registered"

## 2019-07-14 ENCOUNTER — Encounter (HOSPITAL_COMMUNITY): Admission: RE | Disposition: A | Discharge status: Home / Self Care | Payer: Self-pay | Attending: Vascular Surgery

## 2019-07-14 ENCOUNTER — Other Ambulatory Visit: Payer: Self-pay

## 2019-07-14 ENCOUNTER — Ambulatory Visit (HOSPITAL_COMMUNITY)
Admission: RE | Admit: 2019-07-14 | Discharge: 2019-07-14 | Disposition: A | Discharge status: Home / Self Care | Payer: Medicare HMO | Source: Other Acute Inpatient Hospital | Attending: Vascular Surgery | Admitting: Vascular Surgery

## 2019-07-14 ENCOUNTER — Ambulatory Visit (HOSPITAL_COMMUNITY): Payer: Medicare HMO

## 2019-07-14 ENCOUNTER — Encounter (HOSPITAL_COMMUNITY): Payer: Self-pay | Admitting: Surgery

## 2019-07-14 DIAGNOSIS — Z992 Dependence on renal dialysis: Secondary | ICD-10-CM

## 2019-07-14 DIAGNOSIS — E785 Hyperlipidemia, unspecified: Secondary | ICD-10-CM | POA: Diagnosis not present

## 2019-07-14 DIAGNOSIS — T82868A Thrombosis of vascular prosthetic devices, implants and grafts, initial encounter: Secondary | ICD-10-CM | POA: Insufficient documentation

## 2019-07-14 DIAGNOSIS — Z7901 Long term (current) use of anticoagulants: Secondary | ICD-10-CM | POA: Insufficient documentation

## 2019-07-14 DIAGNOSIS — G8929 Other chronic pain: Secondary | ICD-10-CM | POA: Insufficient documentation

## 2019-07-14 DIAGNOSIS — I5043 Acute on chronic combined systolic (congestive) and diastolic (congestive) heart failure: Secondary | ICD-10-CM | POA: Diagnosis not present

## 2019-07-14 DIAGNOSIS — Z8673 Personal history of transient ischemic attack (TIA), and cerebral infarction without residual deficits: Secondary | ICD-10-CM | POA: Diagnosis not present

## 2019-07-14 DIAGNOSIS — Z8674 Personal history of sudden cardiac arrest: Secondary | ICD-10-CM | POA: Insufficient documentation

## 2019-07-14 DIAGNOSIS — I509 Heart failure, unspecified: Secondary | ICD-10-CM | POA: Diagnosis not present

## 2019-07-14 DIAGNOSIS — M545 Low back pain: Secondary | ICD-10-CM | POA: Insufficient documentation

## 2019-07-14 DIAGNOSIS — Z87891 Personal history of nicotine dependence: Secondary | ICD-10-CM | POA: Diagnosis not present

## 2019-07-14 DIAGNOSIS — K219 Gastro-esophageal reflux disease without esophagitis: Secondary | ICD-10-CM | POA: Diagnosis not present

## 2019-07-14 DIAGNOSIS — Y832 Surgical operation with anastomosis, bypass or graft as the cause of abnormal reaction of the patient, or of later complication, without mention of misadventure at the time of the procedure: Secondary | ICD-10-CM | POA: Insufficient documentation

## 2019-07-14 DIAGNOSIS — J811 Chronic pulmonary edema: Secondary | ICD-10-CM | POA: Diagnosis not present

## 2019-07-14 DIAGNOSIS — M109 Gout, unspecified: Secondary | ICD-10-CM | POA: Insufficient documentation

## 2019-07-14 DIAGNOSIS — I110 Hypertensive heart disease with heart failure: Secondary | ICD-10-CM | POA: Diagnosis not present

## 2019-07-14 DIAGNOSIS — N186 End stage renal disease: Secondary | ICD-10-CM

## 2019-07-14 DIAGNOSIS — Z20828 Contact with and (suspected) exposure to other viral communicable diseases: Secondary | ICD-10-CM | POA: Diagnosis not present

## 2019-07-14 DIAGNOSIS — I4891 Unspecified atrial fibrillation: Secondary | ICD-10-CM | POA: Diagnosis not present

## 2019-07-14 DIAGNOSIS — Z8546 Personal history of malignant neoplasm of prostate: Secondary | ICD-10-CM | POA: Insufficient documentation

## 2019-07-14 DIAGNOSIS — J449 Chronic obstructive pulmonary disease, unspecified: Secondary | ICD-10-CM | POA: Insufficient documentation

## 2019-07-14 DIAGNOSIS — I120 Hypertensive chronic kidney disease with stage 5 chronic kidney disease or end stage renal disease: Secondary | ICD-10-CM | POA: Diagnosis not present

## 2019-07-14 DIAGNOSIS — Z79899 Other long term (current) drug therapy: Secondary | ICD-10-CM | POA: Insufficient documentation

## 2019-07-14 DIAGNOSIS — I132 Hypertensive heart and chronic kidney disease with heart failure and with stage 5 chronic kidney disease, or end stage renal disease: Secondary | ICD-10-CM | POA: Insufficient documentation

## 2019-07-14 DIAGNOSIS — M1990 Unspecified osteoarthritis, unspecified site: Secondary | ICD-10-CM | POA: Diagnosis not present

## 2019-07-14 DIAGNOSIS — G4730 Sleep apnea, unspecified: Secondary | ICD-10-CM | POA: Insufficient documentation

## 2019-07-14 DIAGNOSIS — Z452 Encounter for adjustment and management of vascular access device: Secondary | ICD-10-CM | POA: Diagnosis not present

## 2019-07-14 HISTORY — PX: THROMBECTOMY AND REVISION OF ARTERIOVENTOUS (AV) GORETEX  GRAFT: SHX6120

## 2019-07-14 HISTORY — PX: SHUNTOGRAM: SHX5491

## 2019-07-14 HISTORY — PX: INSERTION OF DIALYSIS CATHETER: SHX1324

## 2019-07-14 LAB — BASIC METABOLIC PANEL
Anion gap: 19 — ABNORMAL HIGH (ref 5–15)
BUN: 72 mg/dL — ABNORMAL HIGH (ref 8–23)
CO2: 25 mmol/L (ref 22–32)
Calcium: 9.3 mg/dL (ref 8.9–10.3)
Chloride: 95 mmol/L — ABNORMAL LOW (ref 98–111)
Creatinine, Ser: 13.68 mg/dL — ABNORMAL HIGH (ref 0.61–1.24)
GFR calc Af Amer: 4 mL/min — ABNORMAL LOW (ref >60)
GFR calc non Af Amer: 3 mL/min — ABNORMAL LOW (ref >60)
Glucose, Bld: 77 mg/dL (ref 70–99)
Potassium: 5.9 mmol/L — ABNORMAL HIGH (ref 3.5–5.1)
Sodium: 139 mmol/L (ref 135–145)

## 2019-07-14 LAB — POCT I-STAT 4, (NA,K, GLUC, HGB,HCT)
Glucose, Bld: 85 mg/dL (ref 70–99)
HCT: 35 % — ABNORMAL LOW (ref 39.0–52.0)
Hemoglobin: 11.9 g/dL — ABNORMAL LOW (ref 13.0–17.0)
Potassium: 6.4 mmol/L (ref 3.5–5.1)
Sodium: 134 mmol/L — ABNORMAL LOW (ref 135–145)

## 2019-07-14 LAB — SARS CORONAVIRUS 2 BY RT PCR (HOSPITAL ORDER, PERFORMED IN ~~LOC~~ HOSPITAL LAB): SARS Coronavirus 2: NEGATIVE

## 2019-07-14 LAB — PROTIME-INR
INR: 1.6 — ABNORMAL HIGH (ref 0.8–1.2)
Prothrombin Time: 19 seconds — ABNORMAL HIGH (ref 11.4–15.2)

## 2019-07-14 SURGERY — THROMBECTOMY AND REVISION OF ARTERIOVENTOUS (AV) GORETEX  GRAFT
Anesthesia: Monitor Anesthesia Care | Site: Neck | Laterality: Right

## 2019-07-14 MED ORDER — SODIUM CHLORIDE 0.9 % IV SOLN
INTRAVENOUS | Status: AC
  Filled 2019-07-14: qty 1.2

## 2019-07-14 MED ORDER — HEPARIN SODIUM (PORCINE) 10000 UNIT/ML IJ SOLN
INTRAMUSCULAR | Status: DC | PRN
  Administered 2019-07-14: 1.7 mL via SUBCUTANEOUS

## 2019-07-14 MED ORDER — LIDOCAINE HCL (PF) 1 % IJ SOLN
INTRAMUSCULAR | Status: AC
  Filled 2019-07-14: qty 30

## 2019-07-14 MED ORDER — OXYCODONE-ACETAMINOPHEN 5-325 MG PO TABS: 1.0000 | ORAL_TABLET | Freq: Four times a day (QID) | ORAL | 0 refills | Status: DC | PRN

## 2019-07-14 MED ORDER — ONDANSETRON HCL 4 MG/2ML IJ SOLN
INTRAMUSCULAR | Status: DC | PRN
  Administered 2019-07-14: 4 mg via INTRAVENOUS

## 2019-07-14 MED ORDER — LIDOCAINE HCL (CARDIAC) PF 100 MG/5ML IV SOSY
PREFILLED_SYRINGE | INTRAVENOUS | Status: DC | PRN
  Administered 2019-07-14: 60 mg via INTRAVENOUS

## 2019-07-14 MED ORDER — HYDRALAZINE HCL 20 MG/ML IJ SOLN
5.0000 mg | Freq: Once | INTRAMUSCULAR | Status: AC
  Administered 2019-07-14: 5 mg via INTRAVENOUS

## 2019-07-14 MED ORDER — FENTANYL CITRATE (PF) 250 MCG/5ML IJ SOLN
INTRAMUSCULAR | Status: AC
  Filled 2019-07-14: qty 5

## 2019-07-14 MED ORDER — PROPOFOL 10 MG/ML IV BOLUS
INTRAVENOUS | Status: AC
  Filled 2019-07-14: qty 20

## 2019-07-14 MED ORDER — FENTANYL CITRATE (PF) 100 MCG/2ML IJ SOLN
25.0000 ug | INTRAMUSCULAR | Status: DC | PRN
  Administered 2019-07-14: 25 ug via INTRAVENOUS

## 2019-07-14 MED ORDER — PROPOFOL 500 MG/50ML IV EMUL
INTRAVENOUS | Status: DC | PRN
  Administered 2019-07-14: 50 ug/kg/min via INTRAVENOUS

## 2019-07-14 MED ORDER — SODIUM CHLORIDE 0.9 % IV SOLN
INTRAVENOUS | Status: DC
  Administered 2019-07-14: 15:00:00 via INTRAVENOUS

## 2019-07-14 MED ORDER — LIDOCAINE HCL (PF) 1 % IJ SOLN
INTRAMUSCULAR | Status: DC | PRN
  Administered 2019-07-14: 10 mL
  Administered 2019-07-14: 30 mL

## 2019-07-14 MED ORDER — CEFAZOLIN SODIUM-DEXTROSE 2-3 GM-%(50ML) IV SOLR
INTRAVENOUS | Status: DC | PRN
  Administered 2019-07-14: 2 g via INTRAVENOUS

## 2019-07-14 MED ORDER — DEXAMETHASONE SODIUM PHOSPHATE 10 MG/ML IJ SOLN
INTRAMUSCULAR | Status: AC
  Filled 2019-07-14: qty 1

## 2019-07-14 MED ORDER — SODIUM CHLORIDE 0.9 % IV SOLN
INTRAVENOUS | Status: DC | PRN
  Administered 2019-07-14: 18:00:00 500 mL

## 2019-07-14 MED ORDER — PROPOFOL 10 MG/ML IV BOLUS
INTRAVENOUS | Status: DC | PRN
  Administered 2019-07-14: 20 mg via INTRAVENOUS

## 2019-07-14 MED ORDER — FENTANYL CITRATE (PF) 100 MCG/2ML IJ SOLN
INTRAMUSCULAR | Status: AC
  Filled 2019-07-14: qty 2

## 2019-07-14 MED ORDER — PROTAMINE SULFATE 10 MG/ML IV SOLN
INTRAVENOUS | Status: AC
  Filled 2019-07-14: qty 10

## 2019-07-14 MED ORDER — ONDANSETRON HCL 4 MG/2ML IJ SOLN: 4.0000 mg | Freq: Once | INTRAMUSCULAR | Status: DC | PRN

## 2019-07-14 MED ORDER — HEPARIN SODIUM (PORCINE) 1000 UNIT/ML IJ SOLN
INTRAMUSCULAR | Status: DC | PRN
  Administered 2019-07-14: 8000 [IU] via INTRAVENOUS

## 2019-07-14 MED ORDER — STERILE WATER FOR IRRIGATION IR SOLN
Status: DC | PRN
  Administered 2019-07-14: 1000 mL

## 2019-07-14 MED ORDER — HEMOSTATIC AGENTS (NO CHARGE) OPTIME
TOPICAL | Status: DC | PRN
  Administered 2019-07-14: 1 via TOPICAL

## 2019-07-14 MED ORDER — SODIUM CHLORIDE (PF) 0.9 % IJ SOLN
INTRAVENOUS | Status: DC | PRN
  Administered 2019-07-14: 70 mL via INTRAMUSCULAR

## 2019-07-14 MED ORDER — PROTAMINE SULFATE 10 MG/ML IV SOLN
INTRAVENOUS | Status: AC
  Filled 2019-07-14: qty 25

## 2019-07-14 MED ORDER — HYDRALAZINE HCL 20 MG/ML IJ SOLN
INTRAMUSCULAR | Status: AC
  Filled 2019-07-14: qty 1

## 2019-07-14 MED ORDER — PROPOFOL 1000 MG/100ML IV EMUL
INTRAVENOUS | Status: AC
  Filled 2019-07-14: qty 100

## 2019-07-14 MED ORDER — ONDANSETRON HCL 4 MG/2ML IJ SOLN
INTRAMUSCULAR | Status: AC
  Filled 2019-07-14: qty 2

## 2019-07-14 MED ORDER — HEPARIN SOD (PORK) LOCK FLUSH 100 UNIT/ML IV SOLN
INTRAVENOUS | Status: AC
  Filled 2019-07-14: qty 5

## 2019-07-14 MED ORDER — CEFAZOLIN SODIUM 1 G IJ SOLR
INTRAMUSCULAR | Status: AC
  Filled 2019-07-14: qty 20

## 2019-07-14 MED ORDER — DEXAMETHASONE SODIUM PHOSPHATE 10 MG/ML IJ SOLN
INTRAMUSCULAR | Status: DC | PRN
  Administered 2019-07-14: 5 mg via INTRAVENOUS

## 2019-07-14 MED ORDER — HEPARIN SODIUM (PORCINE) 1000 UNIT/ML IJ SOLN
INTRAMUSCULAR | Status: AC
  Filled 2019-07-14: qty 1

## 2019-07-14 MED ORDER — PROTAMINE SULFATE 10 MG/ML IV SOLN
INTRAVENOUS | Status: DC | PRN
  Administered 2019-07-14 (×2): 40 mg via INTRAVENOUS

## 2019-07-14 MED ORDER — FENTANYL CITRATE (PF) 250 MCG/5ML IJ SOLN
INTRAMUSCULAR | Status: DC | PRN
  Administered 2019-07-14: 50 ug via INTRAVENOUS

## 2019-07-14 MED ORDER — 0.9 % SODIUM CHLORIDE (POUR BTL) OPTIME
TOPICAL | Status: DC | PRN
  Administered 2019-07-14: 1000 mL

## 2019-07-14 MED ORDER — LIDOCAINE 2% (20 MG/ML) 5 ML SYRINGE
INTRAMUSCULAR | Status: AC
  Filled 2019-07-14: qty 5

## 2019-07-14 SURGICAL SUPPLY — 54 items
ADH SKN CLS APL DERMABOND .7 (GAUZE/BANDAGES/DRESSINGS) ×6
AGENT HMST SPONGE THK3/8 (HEMOSTASIS)
ARMBAND PINK RESTRICT EXTREMIT (MISCELLANEOUS) ×8 IMPLANT
BIOPATCH RED 1 DISK 7.0 (GAUZE/BANDAGES/DRESSINGS) ×1 IMPLANT
BIOPATCH RED 1IN DISK 7.0MM (GAUZE/BANDAGES/DRESSINGS) ×1
BNDG COHESIVE 6X5 TAN STRL LF (GAUZE/BANDAGES/DRESSINGS) ×2 IMPLANT
CANISTER SUCT 3000ML PPV (MISCELLANEOUS) ×4 IMPLANT
CANNULA VESSEL 3MM 2 BLNT TIP (CANNULA) IMPLANT
CATH EMB 4FR 80CM (CATHETERS) ×4 IMPLANT
CLIP VESOCCLUDE MED 6/CT (CLIP) ×4 IMPLANT
CLIP VESOCCLUDE SM WIDE 6/CT (CLIP) ×4 IMPLANT
CONT SPEC 4OZ CLIKSEAL STRL BL (MISCELLANEOUS) ×2 IMPLANT
COVER DOME SNAP 22 D (MISCELLANEOUS) ×2 IMPLANT
COVER LIGHT HANDLE  DEROYL (MISCELLANEOUS) ×2 IMPLANT
COVER PROBE W GEL 5X96 (DRAPES) ×2 IMPLANT
COVER WAND RF STERILE (DRAPES) ×2 IMPLANT
DECANTER SPIKE VIAL GLASS SM (MISCELLANEOUS) ×6 IMPLANT
DERMABOND ADVANCED (GAUZE/BANDAGES/DRESSINGS) ×6
DERMABOND ADVANCED .7 DNX12 (GAUZE/BANDAGES/DRESSINGS) ×2 IMPLANT
DRAPE X-RAY CASS 24X20 (DRAPES) IMPLANT
ELECT CAUTERY BLADE 6.4 (BLADE) ×2 IMPLANT
ELECT REM PT RETURN 9FT ADLT (ELECTROSURGICAL) ×4
ELECTRODE REM PT RTRN 9FT ADLT (ELECTROSURGICAL) ×2 IMPLANT
GLOVE BIO SURGEON STRL SZ 6.5 (GLOVE) ×3 IMPLANT
GLOVE BIO SURGEON STRL SZ7.5 (GLOVE) ×4 IMPLANT
GLOVE BIO SURGEONS STRL SZ 6.5 (GLOVE) ×3
GLOVE BIOGEL PI IND STRL 6.5 (GLOVE) IMPLANT
GLOVE BIOGEL PI INDICATOR 6.5 (GLOVE) ×2
GLOVE SURG SS PI 6.5 STRL IVOR (GLOVE) ×2 IMPLANT
GOWN STRL REUS W/ TWL LRG LVL3 (GOWN DISPOSABLE) ×6 IMPLANT
GOWN STRL REUS W/TWL LRG LVL3 (GOWN DISPOSABLE) ×24
HEMOSTAT SPONGE AVITENE ULTRA (HEMOSTASIS) IMPLANT
KIT BASIN OR (CUSTOM PROCEDURE TRAY) ×4 IMPLANT
KIT TURNOVER KIT B (KITS) ×4 IMPLANT
LOOP VESSEL MINI RED (MISCELLANEOUS) IMPLANT
NS IRRIG 1000ML POUR BTL (IV SOLUTION) ×4 IMPLANT
PACK CV ACCESS (CUSTOM PROCEDURE TRAY) ×4 IMPLANT
PAD ARMBOARD 7.5X6 YLW CONV (MISCELLANEOUS) ×8 IMPLANT
PENCIL BUTTON HOLSTER BLD 10FT (ELECTRODE) ×2 IMPLANT
SET COLLECT BLD 21X3/4 12 (NEEDLE) IMPLANT
SET MICROPUNCTURE 5F STIFF (MISCELLANEOUS) ×2 IMPLANT
STOPCOCK 4 WAY LG BORE MALE ST (IV SETS) ×2 IMPLANT
SUT ETHILON 3 0 PS 1 (SUTURE) ×2 IMPLANT
SUT PROLENE 6 0 BV (SUTURE) ×2 IMPLANT
SUT PROLENE 6 0 CC (SUTURE) ×6 IMPLANT
SUT VIC AB 3-0 SH 27 (SUTURE) ×4
SUT VIC AB 3-0 SH 27X BRD (SUTURE) ×2 IMPLANT
SUT VICRYL 4-0 PS2 18IN ABS (SUTURE) ×8 IMPLANT
SYR 10ML LL (SYRINGE) ×4 IMPLANT
SYR 20ML LL LF (SYRINGE) ×2 IMPLANT
TOWEL GREEN STERILE (TOWEL DISPOSABLE) ×4 IMPLANT
TUBING EXTENTION W/L.L. (IV SETS) ×2 IMPLANT
UNDERPAD 30X30 (UNDERPADS AND DIAPERS) ×4 IMPLANT
WATER STERILE IRR 1000ML POUR (IV SOLUTION) ×4 IMPLANT

## 2019-07-14 NOTE — Discharge Instructions (Signed)
[facility]  Discharge Instructions  AV Fistula or Graft Surgery for Dialysis Access  Please refer to the following instructions for your post-procedure care. Your surgeon or physician assistant will discuss any changes with you.  Activity  You may drive the day following your surgery, if you are comfortable and no longer taking prescription pain medication. Resume full activity as the soreness in your incision resolves.  Bathing/Showering  You may shower after you go home. Keep your incision dry for 48 hours. Do not soak in a bathtub, hot tub, or swim until the incision heals completely. You may not shower if you have a hemodialysis catheter.  Incision Care  Clean your incision with mild soap and water after 48 hours. Pat the area dry with a clean towel. You do not need a bandage unless otherwise instructed. Do not apply any ointments or creams to your incision. You may have skin glue on your incision. Do not peel it off. It will come off on its own in about one week. Your arm may swell a bit after surgery. To reduce swelling use pillows to elevate your arm so it is above your heart. Your doctor will tell you if you need to lightly wrap your arm with an ACE bandage.  Diet  Resume your normal diet. There are not special food restrictions following this procedure. In order to heal from your surgery, it is CRITICAL to get adequate nutrition. Your body requires vitamins, minerals, and protein. Vegetables are the best source of vitamins and minerals. Vegetables also provide the perfect balance of protein. Processed food has little nutritional value, so try to avoid this.  Medications  Resume taking all of your medications. If your incision is causing pain, you may take over-the counter pain relievers such as acetaminophen (Tylenol). If you were prescribed a stronger pain medication, please be aware these medications can cause nausea and constipation. Prevent  nausea by taking the medication with a snack or meal. Avoid constipation by drinking plenty of fluids and eating foods with high amount of fiber, such as fruits, vegetables, and grains.  Do not take Tylenol if you are taking prescription pain medications.  RESTART COUMADIN ON 07/15/2019.   Follow up Your surgeon may want to see you in the office following your access surgery. If so, this will be arranged at the time of your surgery.  Please call us immediately for any of the following conditions:  Increased pain, redness, drainage (pus) from your incision site Fever of 101 degrees or higher Severe or worsening pain at your incision site Hand pain or numbness.  Reduce your risk of vascular disease:  Stop smoking. If you would like help, call [facility] at 1-800-QUIT-NOW (469)342-9808) or [place] at [place] your cholesterol Maintain a desired weight Control your diabetes Keep your blood pressure down  Dialysis  It will take several weeks to several months for your new dialysis access to be ready for use. Your surgeon will determine when it is okay to use it. Your nephrologist will continue to direct your dialysis. You can continue to use your Permcath until your new access is ready for use.   07/14/2019 [facility] [place] [id number] [birth year]  Surgeon(s): Fields, Jessy Oto, MD  Procedure(s): THROMBECTOMY AND REVISION OF ARTERIOVENTOUS (AV) GORETEX  GRAFT Insertion Of Dialysis Catheter Shuntogram  x Do not stick graft for 4 weeks    If you have any questions, please call the office at [phone].

## 2019-07-14 NOTE — Transfer of Care (Signed)
Immediate Anesthesia Transfer of Care Note  Patient: [facility]  Procedure(s) Performed: THROMBECTOMY AND REVISION OF ARTERIOVENTOUS (AV) GORETEX  GRAFT (Right ) Insertion Of Dialysis Catheter (Right Neck) Shuntogram (Right )  Patient Location: PACU  Anesthesia Type:MAC  Level of Consciousness: awake, alert  and oriented  Airway & Oxygen Therapy: Patient Spontanous Breathing and Patient connected to face mask oxygen  Post-op Assessment: Report given to RN and Post -op Vital signs reviewed and stable  Post vital signs: Reviewed and stable  Last Vitals:  Vitals Value Taken Time  BP 196/80 07/14/19 1936  Temp    Pulse 66 07/14/19 1939  Resp 17 07/14/19 1939  SpO2 100 % 07/14/19 1939  Vitals shown include unvalidated device data.  Last Pain:  Vitals:   07/14/19 1213  TempSrc: Oral         Complications: No apparent anesthesia complications

## 2019-07-14 NOTE — Anesthesia Preprocedure Evaluation (Signed)
Anesthesia Evaluation  Patient identified by MRN, date of birth, ID band Patient awake    Reviewed: Allergy & Precautions, NPO status , Patient's Chart, lab work & pertinent test results  Airway Mallampati: II  TM Distance: >3 FB Neck ROM: Full    Dental  (+) Teeth Intact, Dental Advisory Given   Pulmonary sleep apnea , COPD, former smoker,    Pulmonary exam normal breath sounds clear to auscultation       Cardiovascular hypertension, Pt. on medications + CAD and +CHF  Normal cardiovascular exam+ dysrhythmias  Rhythm:Regular Rate:Normal     Neuro/Psych CVA, No Residual Symptoms    GI/Hepatic Neg liver ROS, PUD, GERD  ,  Endo/Other  negative endocrine ROS  Renal/GU Dialysis and ESRFRenal disease     Musculoskeletal  (+) Arthritis ,   Abdominal   Peds  Hematology  (+) Blood dyscrasia (Warfarin), anemia , HIV,   Anesthesia Other Findings Day of surgery medications reviewed with the patient.  Reproductive/Obstetrics                             Anesthesia Physical Anesthesia Plan  ASA: III  Anesthesia Plan: MAC   Post-op Pain Management:    Induction: Intravenous  PONV Risk Score and Plan: 2 and Propofol infusion and Treatment may vary due to age or medical condition  Airway Management Planned: Nasal Cannula and Natural Airway  Additional Equipment:   Intra-op Plan:   Post-operative Plan:   Informed Consent: I have reviewed the patients History and Physical, chart, labs and discussed the procedure including the risks, benefits and alternatives for the proposed anesthesia with the patient or authorized representative who has indicated his/her understanding and acceptance.     Dental advisory given  Plan Discussed with: CRNA and Anesthesiologist  Anesthesia Plan Comments:         Anesthesia Quick Evaluation

## 2019-07-14 NOTE — H&P (Signed)
[facility]IN [facility]ISTS SHORT STAY H&P  CC:  Clotted access  HPI: Access right arm clotted after revision 3 days ago  Past Medical History:  Diagnosis Date  . 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 Acute on chronic systolic and diastolic heart failure, NYHA class 4 ([place])   . Anemia, iron deficiency 11/15/2011  . Arthritis    "hands, right knee, feet" (02/21/2018)  . Atrial fibrillation ([place])   . Cancer [name])    hx of prostate; s/p radioactive seed implant 10/2009 Dr Janice Norrie  . Cardiac arrest ([place]) 02/17/2018  . CHF (congestive heart failure) ([place])   . 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 Chronic lower back pain   . CKD (chronic kidney disease) stage V requiring chronic dialysis ([place])   . ESRD on dialysis [facility])    started 02/2018 [place]  . GERD (gastroesophageal reflux disease)   . Glaucoma    "I had surgery and dont have it any more"  . Gout    daily RX (02/21/2018)  . Heart murmur    "mild" per pt  . Hepatitis    years ago  . HIV (human immunodeficiency virus infection) ([place])   . HIV infection ([place])   . Hyperlipidemia   . Hypertension    followed by [facility] (Dr Dani Gobble [place])  . Hypertension   . Nonischemic cardiomyopathy ([place])   . Pneumonia 11/2017  . Prostate cancer ([place])   . Sinus bradycardia   . Sleep apnea    does not use a cpap  . Stroke [facility])    "mini stroke" years ago    FH:  Non-Contributory  Social HX Social History   Tobacco Use  . Smoking status: Former Smoker    Packs/day: 1.00    Years: 30.00    Pack years: 30.00    Types: Cigarettes    Quit date: 2006    Years since quitting: 14.6  . Smokeless tobacco: Never Used  Substance Use Topics  . Alcohol use: Yes    Alcohol/week: 1.0 standard drinks    Types: 1 Shots of liquor per week  . Drug use: Never    Allergies Allergies   Allergen Reactions  . Cozaar [Losartan Potassium] Other (See Comments)    Causes constipation     Medications Current Facility-Administered Medications  Medication Dose Route Frequency Provider Last Rate Last Dose  . 0.9 %  sodium chloride infusion   Intravenous Continuous Catalina Gravel, MD 10 mL/hr at 07/14/19 1454     No current facility-administered medications on file prior to encounter.    Current Outpatient Medications on File Prior to Encounter  Medication Sig Dispense Refill  . acetaminophen (TYLENOL) 500 MG tablet Take 500-1,000 mg by mouth every 6 (six) hours as needed for mild pain.     Marland Kitchen allopurinol (ZYLOPRIM) 100 MG tablet 1 tablet by mouth 3x weekly after dialysis. (Patient taking differently: Take 100 mg by mouth daily at 2 PM. ) 90 tablet 1  . amiodarone (PACERONE) 200 MG tablet Take 1 tablet (200 mg total) by mouth daily. 90 tablet 3  . amoxicillin (AMOXIL) 500 MG capsule Take QDAILY in the am, then on dialysis days take ANOTHER dose AFTER HD (Patient taking differently: Take 500 mg by mouth daily. ) 60 capsule 5  . AURYXIA 1 GM 210 MG(Fe) tablet Take 420 mg by mouth 3 (three) times  daily with meals.     . bictegravir-emtricitabine-tenofovir AF ([name]) 50-200-25 MG TABS tablet Take 1 tablet by mouth daily. 30 tablet 11  . brimonidine (ALPHAGAN) 0.15 % ophthalmic solution Place 1 drop into the left eye 2 (two) times daily.  3  . dorzolamide-timolol (COSOPT) 22.3-6.8 MG/ML ophthalmic solution Place 1 drop into the left eye 2 (two) times daily.    Marland Kitchen latanoprost (XALATAN) 0.005 % ophthalmic solution Place 1 drop into the left eye at bedtime.     Marland Kitchen loratadine-pseudoephedrine (CLARITIN-D 24-HOUR) 10-240 MG 24 hr tablet Take 1 tablet by mouth daily at 2 PM.    . midodrine (PROAMATINE) 10 MG tablet Take 1 tablet (10 mg total) by mouth as directed. Twice a day on the day of HD Tuesday, Thursday, Saturday (1 in AM and 1 at [place]). Take 1/2 tablet (5mg ) a day on non HD days  (Patient taking differently: Take 10 mg by mouth See admin instructions. Take 1 tablet (10 mg) by mouth 3 times daily on Mondays, Tuesdays, Thursdays, & Saturdays.) 90 tablet 5  . multivitamin (RENA-VIT) TABS tablet Take 1 tablet by mouth daily at 2 PM.     . oxyCODONE-acetaminophen (PERCOCET/ROXICET) 5-325 MG tablet Take 1 tablet by mouth every 6 (six) hours as needed. 12 tablet 0  . warfarin (COUMADIN) 5 MG tablet Take 2 tablets by mouth daily or as directed by coumadin clinic (Patient taking differently: Take 2.5-5 mg by mouth See admin instructions. Take 0.5 tablet (2.5 mg) by mouth on Mondays & Fridays, take 1 tablet (5 mg) by mouth on Sundays, Tuesdays, Wednesdays, Thursdays, & Saturdays at 1800) 60 tablet 1     PHYSICAL EXAM  Vitals:   07/14/19 1213  BP: (!) 174/80  Pulse: 78  Resp: 18  Temp: 98.5 F (36.9 C)  SpO2: 100%    General:  WDWN in NAD HENT: WNL Eyes: Pupils equal Cardiac: RRR, Vascular Exam/Pulses: occluded right upper arm graft, healing mid arm incision  Extremities without ischemic changes, no Gangrene , no cellulitis; no open wounds;  Neuro A&O x 3; good sensation; motion in all extremities  Impression: clotted right upper arm AV graft early post revision  Plan: Trombectomy revision right arm av gratt with shuntogram  Insert tunneled HD cath  Ruta Hinds @TODAY @ 5:06 PM

## 2019-07-14 NOTE — Op Note (Signed)
Procedure: Thrombectomy right upper arm AV graft, intraoperative shuntogram, insertion 23 cm palindrome catheter right internal jugular vein  Preoperative diagnosis: End-stage renal disease  Postoperative diagnosis: Same  Anesthesia: Local with IV sedation  Assistant: Leontine Locket, PA-C  Operative findings: #1 no significant inflow or outflow stenosis right upper arm AV graft  Operative details: After team informed consent, the patient taken to the operating.  The patient is placed in supine position operating table.  After adequate sedation patient's entire neck and chest were prepped and draped in usual sterile fashion.  Ultrasound was used to identify the patient's right internal jugular vein.  This had normal compressibility and respiratory variation.  Local anesthesia was infiltrated over the right internal jugular vein.  Introducer needle was used to cannulate the right internal jugular vein 035 J-tip guidewire threaded into the inferior vena cava under fluoroscopic guidance.  Sequential 11/17/2015 French dilator with peel-away sheath placed over the guidewire in the right internal jugular vein.  Guidewire and dilator removed.  Catheter was then tunneled subcutaneously and the hub attached.  It was noted to flush and draw easily.  Catheter was sutured the skin with nylon sutures.  The neck insertion site was closed with a Vicryl stitch.  Catheter was then loaded with concentrated heparin solution.   At this point the patient's entire right upper extremity was prepped and draped in usual sterile fashion.  Local anesthesia was infiltrated over a pre-existing incision in the mid right upper arm.  The incision was reopened carried down through subcutaneous tissues down the level of a existing artegraft.  There was no flow within it.  Patient was given 8000 units of intravenous heparin.  Transverse graftotomy was made.  #4 Fogarty catheter was passed up the distal limb of the graft multiple passes  were made until there was excellent venous backbleeding no further thrombus retrieved.  This was controlled with a fistula clamp.  #4 Fogarty catheter was then used to thrombectomize the proximal aspect of the graft.  An arterialized plug was retrieved and there was excellent arterial inflow.  The graftotomy was repaired with a running 6-0 Prolene suture.  There then was for blood backbled and thoroughly flushed.  A micropuncture needle was then brought up in the operative field and the graft was cannulated micropuncture sheath inserted for completion fistulogram.  This was done first with inflow occlusion to evaluate the venous outflow.  There was no significant venous outflow obstruction.  I then pulled the sheath back and then clamped the outflow to reflux contrast across the inflow.  There was no significant inflow stenosis.  At this point the micropuncture sheath was removed and the hole repaired with a single 6-0 Prolene suture.  Hemostasis was obtained with the administration of 80 mg of protamine as well as Avitene and direct pressure.  Subcutaneous tissues of the incision were reapproximated using a running 3-0 Vicryl suture.  The skin was closed with a 4-0 Vicryl subcuticular stitch.  Dermabond was applied the skin.  Patient tolerated procedure well and there were no complications.  The incident sponge needle count was correct end of the case.  The patient was taken to recovery in stable condition.  Operative management: Patient has a catheter in place we will use this for the next 4 weeks while his Artegraft heals.  If it reoccludes consideration would need to be given for a new hemodialysis access.  Ruta Hinds, MD [facility] [place] Office: [phone] Pager: [phone]

## 2019-07-14 NOTE — Anesthesia Procedure Notes (Signed)
Procedure Name: MAC Date/Time: 07/14/2019 6:00 PM Performed by: Teressa Lower., CRNA Pre-anesthesia Checklist: Patient identified, Emergency Drugs available, Suction available, Patient being monitored and Timeout performed Patient Re-evaluated:Patient Re-evaluated prior to induction Oxygen Delivery Method: Simple face mask

## 2019-07-14 NOTE — Anesthesia Postprocedure Evaluation (Signed)
Anesthesia Post Note  Patient: [facility]  Procedure(s) Performed: THROMBECTOMY AND REVISION OF ARTERIOVENTOUS (AV) GORETEX  GRAFT (Right ) Insertion Of Dialysis Catheter (Right Neck) Shuntogram (Right )     Patient location during evaluation: PACU Anesthesia Type: MAC Level of consciousness: awake and alert Pain management: pain level controlled Vital Signs Assessment: post-procedure vital signs reviewed and stable Respiratory status: spontaneous breathing, nonlabored ventilation and respiratory function stable Cardiovascular status: stable and blood pressure returned to baseline Postop Assessment: no apparent nausea or vomiting Anesthetic complications: no    Last Vitals:  Vitals:   07/14/19 2015 07/14/19 2030  BP: (!) 163/81 (!) 165/75  Pulse: 77 78  Resp: 16 16  Temp:  (!) 36.1 C  SpO2: 100% 100%    Last Pain:  Vitals:   07/14/19 1952  TempSrc:   PainSc: 4                  Araceli Coufal,W. EDMOND

## 2019-07-14 NOTE — Progress Notes (Signed)
Patient has gotten stuck multiple times for IV and unable to recollect I-Stat. Dr. Gifford Shave aware. Patient placed on cardiac monitor. NSR.

## 2019-07-14 NOTE — Progress Notes (Signed)
CRITICAL VALUE ALERT  Critical Value:  Potassium 6.4  Date & Time Notied:  07/14/2019 1331   Provider Notified: Dr. Gifford Shave  Orders Received/Actions taken: Suspect specimen is hemolyzed will recollect.

## 2019-07-14 NOTE — Progress Notes (Signed)
D; PCXR done, faxed to [facility] dialysis center.

## 2019-07-15 ENCOUNTER — Telehealth: Payer: Medicare HMO | Admitting: Cardiovascular Disease

## 2019-07-15 ENCOUNTER — Encounter (HOSPITAL_COMMUNITY): Payer: Self-pay | Admitting: Vascular Surgery

## 2019-07-15 DIAGNOSIS — Z992 Dependence on renal dialysis: Secondary | ICD-10-CM | POA: Diagnosis not present

## 2019-07-15 DIAGNOSIS — E8770 Fluid overload, unspecified: Secondary | ICD-10-CM | POA: Diagnosis not present

## 2019-07-15 DIAGNOSIS — I5041 Acute combined systolic (congestive) and diastolic (congestive) heart failure: Secondary | ICD-10-CM | POA: Diagnosis not present

## 2019-07-15 DIAGNOSIS — N186 End stage renal disease: Secondary | ICD-10-CM | POA: Diagnosis not present

## 2019-07-15 DIAGNOSIS — N2581 Secondary hyperparathyroidism of renal origin: Secondary | ICD-10-CM | POA: Diagnosis not present

## 2019-07-15 DIAGNOSIS — J810 Acute pulmonary edema: Secondary | ICD-10-CM | POA: Diagnosis not present

## 2019-07-15 DIAGNOSIS — I132 Hypertensive heart and chronic kidney disease with heart failure and with stage 5 chronic kidney disease, or end stage renal disease: Secondary | ICD-10-CM | POA: Diagnosis not present

## 2019-07-15 DIAGNOSIS — I953 Hypotension of hemodialysis: Secondary | ICD-10-CM | POA: Diagnosis not present

## 2019-07-15 NOTE — Telephone Encounter (Signed)
Please advise if ok to refill Allopurinol 100 mg tablet qd.

## 2019-07-15 NOTE — Addendum Note (Signed)
Addendum  created 07/15/19 2100 by Belinda Block, MD   Intraprocedure Staff edited

## 2019-07-17 DIAGNOSIS — I5041 Acute combined systolic (congestive) and diastolic (congestive) heart failure: Secondary | ICD-10-CM | POA: Diagnosis not present

## 2019-07-17 DIAGNOSIS — Z992 Dependence on renal dialysis: Secondary | ICD-10-CM | POA: Diagnosis not present

## 2019-07-17 DIAGNOSIS — I132 Hypertensive heart and chronic kidney disease with heart failure and with stage 5 chronic kidney disease, or end stage renal disease: Secondary | ICD-10-CM | POA: Diagnosis not present

## 2019-07-17 DIAGNOSIS — E8770 Fluid overload, unspecified: Secondary | ICD-10-CM | POA: Diagnosis not present

## 2019-07-17 DIAGNOSIS — N186 End stage renal disease: Secondary | ICD-10-CM | POA: Diagnosis not present

## 2019-07-17 DIAGNOSIS — J810 Acute pulmonary edema: Secondary | ICD-10-CM | POA: Diagnosis not present

## 2019-07-17 DIAGNOSIS — I953 Hypotension of hemodialysis: Secondary | ICD-10-CM | POA: Diagnosis not present

## 2019-07-17 DIAGNOSIS — N2581 Secondary hyperparathyroidism of renal origin: Secondary | ICD-10-CM | POA: Diagnosis not present

## 2019-07-18 ENCOUNTER — Ambulatory Visit: Payer: Medicare HMO | Admitting: Cardiovascular Disease

## 2019-07-19 DIAGNOSIS — E8770 Fluid overload, unspecified: Secondary | ICD-10-CM | POA: Diagnosis not present

## 2019-07-19 DIAGNOSIS — J810 Acute pulmonary edema: Secondary | ICD-10-CM | POA: Diagnosis not present

## 2019-07-19 DIAGNOSIS — N2581 Secondary hyperparathyroidism of renal origin: Secondary | ICD-10-CM | POA: Diagnosis not present

## 2019-07-19 DIAGNOSIS — N186 End stage renal disease: Secondary | ICD-10-CM | POA: Diagnosis not present

## 2019-07-19 DIAGNOSIS — I5041 Acute combined systolic (congestive) and diastolic (congestive) heart failure: Secondary | ICD-10-CM | POA: Diagnosis not present

## 2019-07-19 DIAGNOSIS — I953 Hypotension of hemodialysis: Secondary | ICD-10-CM | POA: Diagnosis not present

## 2019-07-19 DIAGNOSIS — I132 Hypertensive heart and chronic kidney disease with heart failure and with stage 5 chronic kidney disease, or end stage renal disease: Secondary | ICD-10-CM | POA: Diagnosis not present

## 2019-07-19 DIAGNOSIS — Z992 Dependence on renal dialysis: Secondary | ICD-10-CM | POA: Diagnosis not present

## 2019-07-21 ENCOUNTER — Encounter (HOSPITAL_COMMUNITY): Payer: Self-pay | Admitting: Vascular Surgery

## 2019-07-21 DIAGNOSIS — N186 End stage renal disease: Secondary | ICD-10-CM | POA: Diagnosis not present

## 2019-07-21 DIAGNOSIS — I5041 Acute combined systolic (congestive) and diastolic (congestive) heart failure: Secondary | ICD-10-CM | POA: Diagnosis not present

## 2019-07-21 DIAGNOSIS — E8770 Fluid overload, unspecified: Secondary | ICD-10-CM | POA: Diagnosis not present

## 2019-07-21 DIAGNOSIS — N2581 Secondary hyperparathyroidism of renal origin: Secondary | ICD-10-CM | POA: Diagnosis not present

## 2019-07-21 DIAGNOSIS — I953 Hypotension of hemodialysis: Secondary | ICD-10-CM | POA: Diagnosis not present

## 2019-07-21 DIAGNOSIS — I132 Hypertensive heart and chronic kidney disease with heart failure and with stage 5 chronic kidney disease, or end stage renal disease: Secondary | ICD-10-CM | POA: Diagnosis not present

## 2019-07-21 DIAGNOSIS — Z992 Dependence on renal dialysis: Secondary | ICD-10-CM | POA: Diagnosis not present

## 2019-07-21 DIAGNOSIS — J810 Acute pulmonary edema: Secondary | ICD-10-CM | POA: Diagnosis not present

## 2019-07-22 DIAGNOSIS — I953 Hypotension of hemodialysis: Secondary | ICD-10-CM | POA: Diagnosis not present

## 2019-07-22 DIAGNOSIS — J810 Acute pulmonary edema: Secondary | ICD-10-CM | POA: Diagnosis not present

## 2019-07-22 DIAGNOSIS — I132 Hypertensive heart and chronic kidney disease with heart failure and with stage 5 chronic kidney disease, or end stage renal disease: Secondary | ICD-10-CM | POA: Diagnosis not present

## 2019-07-22 DIAGNOSIS — Z992 Dependence on renal dialysis: Secondary | ICD-10-CM | POA: Diagnosis not present

## 2019-07-22 DIAGNOSIS — N186 End stage renal disease: Secondary | ICD-10-CM | POA: Diagnosis not present

## 2019-07-22 DIAGNOSIS — E8770 Fluid overload, unspecified: Secondary | ICD-10-CM | POA: Diagnosis not present

## 2019-07-22 DIAGNOSIS — N2581 Secondary hyperparathyroidism of renal origin: Secondary | ICD-10-CM | POA: Diagnosis not present

## 2019-07-22 DIAGNOSIS — I5041 Acute combined systolic (congestive) and diastolic (congestive) heart failure: Secondary | ICD-10-CM | POA: Diagnosis not present

## 2019-07-24 DIAGNOSIS — I132 Hypertensive heart and chronic kidney disease with heart failure and with stage 5 chronic kidney disease, or end stage renal disease: Secondary | ICD-10-CM | POA: Diagnosis not present

## 2019-07-24 DIAGNOSIS — I953 Hypotension of hemodialysis: Secondary | ICD-10-CM | POA: Diagnosis not present

## 2019-07-24 DIAGNOSIS — J810 Acute pulmonary edema: Secondary | ICD-10-CM | POA: Diagnosis not present

## 2019-07-24 DIAGNOSIS — N186 End stage renal disease: Secondary | ICD-10-CM | POA: Diagnosis not present

## 2019-07-24 DIAGNOSIS — Z992 Dependence on renal dialysis: Secondary | ICD-10-CM | POA: Diagnosis not present

## 2019-07-24 DIAGNOSIS — N2581 Secondary hyperparathyroidism of renal origin: Secondary | ICD-10-CM | POA: Diagnosis not present

## 2019-07-24 DIAGNOSIS — I5041 Acute combined systolic (congestive) and diastolic (congestive) heart failure: Secondary | ICD-10-CM | POA: Diagnosis not present

## 2019-07-24 DIAGNOSIS — E8770 Fluid overload, unspecified: Secondary | ICD-10-CM | POA: Diagnosis not present

## 2019-07-25 ENCOUNTER — Other Ambulatory Visit: Payer: Self-pay | Admitting: Cardiovascular Disease

## 2019-07-25 LAB — POCT INR: INR: 2.9 (ref 2.0–3.0)

## 2019-07-25 LAB — PROTIME-INR: INR: 2.9 — AB (ref 0.9–1.1)

## 2019-07-25 MED ORDER — WARFARIN SODIUM 5 MG PO TABS: ORAL_TABLET | ORAL | 0 refills | Status: DC

## 2019-07-25 NOTE — Telephone Encounter (Signed)
° ° °*  STAT* If patient is at the pharmacy, call can be transferred to refill team.   1. Which medications need to be refilled? (please list name of each medication and dose if known)   warfarin (COUMADIN) 5 MG tablet  2. Which pharmacy/location (including street and city if local pharmacy) is medication to be sent to? [facility] [place], [place]  3. Do they need a 30 day or 90 day supply? 36  Patient says he went to the pharmacy on Wednesday, and the pharmacy was supposed to fax our office for a new rx, as the patient does not have any refills on his current rx.  The patient went back to the pharmacy today, and the pharmacy has not received anything from our office.   The patient is out of medication

## 2019-07-26 DIAGNOSIS — I953 Hypotension of hemodialysis: Secondary | ICD-10-CM | POA: Diagnosis not present

## 2019-07-26 DIAGNOSIS — N2581 Secondary hyperparathyroidism of renal origin: Secondary | ICD-10-CM | POA: Diagnosis not present

## 2019-07-26 DIAGNOSIS — E8770 Fluid overload, unspecified: Secondary | ICD-10-CM | POA: Diagnosis not present

## 2019-07-26 DIAGNOSIS — I5041 Acute combined systolic (congestive) and diastolic (congestive) heart failure: Secondary | ICD-10-CM | POA: Diagnosis not present

## 2019-07-26 DIAGNOSIS — N186 End stage renal disease: Secondary | ICD-10-CM | POA: Diagnosis not present

## 2019-07-26 DIAGNOSIS — Z992 Dependence on renal dialysis: Secondary | ICD-10-CM | POA: Diagnosis not present

## 2019-07-26 DIAGNOSIS — J810 Acute pulmonary edema: Secondary | ICD-10-CM | POA: Diagnosis not present

## 2019-07-26 DIAGNOSIS — I132 Hypertensive heart and chronic kidney disease with heart failure and with stage 5 chronic kidney disease, or end stage renal disease: Secondary | ICD-10-CM | POA: Diagnosis not present

## 2019-07-28 ENCOUNTER — Ambulatory Visit (INDEPENDENT_AMBULATORY_CARE_PROVIDER_SITE_OTHER): Payer: Medicare HMO | Admitting: Cardiology

## 2019-07-28 DIAGNOSIS — N2581 Secondary hyperparathyroidism of renal origin: Secondary | ICD-10-CM | POA: Diagnosis not present

## 2019-07-28 DIAGNOSIS — I482 Chronic atrial fibrillation, unspecified: Secondary | ICD-10-CM | POA: Diagnosis not present

## 2019-07-28 DIAGNOSIS — Z992 Dependence on renal dialysis: Secondary | ICD-10-CM | POA: Diagnosis not present

## 2019-07-28 DIAGNOSIS — Z515 Encounter for palliative care: Secondary | ICD-10-CM

## 2019-07-28 DIAGNOSIS — I5041 Acute combined systolic (congestive) and diastolic (congestive) heart failure: Secondary | ICD-10-CM | POA: Diagnosis not present

## 2019-07-28 DIAGNOSIS — E8770 Fluid overload, unspecified: Secondary | ICD-10-CM | POA: Diagnosis not present

## 2019-07-28 DIAGNOSIS — I953 Hypotension of hemodialysis: Secondary | ICD-10-CM | POA: Diagnosis not present

## 2019-07-28 DIAGNOSIS — J810 Acute pulmonary edema: Secondary | ICD-10-CM | POA: Diagnosis not present

## 2019-07-28 DIAGNOSIS — I132 Hypertensive heart and chronic kidney disease with heart failure and with stage 5 chronic kidney disease, or end stage renal disease: Secondary | ICD-10-CM | POA: Diagnosis not present

## 2019-07-28 DIAGNOSIS — N186 End stage renal disease: Secondary | ICD-10-CM | POA: Diagnosis not present

## 2019-07-29 DIAGNOSIS — I132 Hypertensive heart and chronic kidney disease with heart failure and with stage 5 chronic kidney disease, or end stage renal disease: Secondary | ICD-10-CM | POA: Diagnosis not present

## 2019-07-29 DIAGNOSIS — I5041 Acute combined systolic (congestive) and diastolic (congestive) heart failure: Secondary | ICD-10-CM | POA: Diagnosis not present

## 2019-07-29 DIAGNOSIS — N2581 Secondary hyperparathyroidism of renal origin: Secondary | ICD-10-CM | POA: Diagnosis not present

## 2019-07-29 DIAGNOSIS — J810 Acute pulmonary edema: Secondary | ICD-10-CM | POA: Diagnosis not present

## 2019-07-29 DIAGNOSIS — I953 Hypotension of hemodialysis: Secondary | ICD-10-CM | POA: Diagnosis not present

## 2019-07-29 DIAGNOSIS — N186 End stage renal disease: Secondary | ICD-10-CM | POA: Diagnosis not present

## 2019-07-29 DIAGNOSIS — E8770 Fluid overload, unspecified: Secondary | ICD-10-CM | POA: Diagnosis not present

## 2019-07-29 DIAGNOSIS — Z992 Dependence on renal dialysis: Secondary | ICD-10-CM | POA: Diagnosis not present

## 2019-07-30 DIAGNOSIS — R69 Illness, unspecified: Secondary | ICD-10-CM | POA: Diagnosis not present

## 2019-07-31 DIAGNOSIS — J810 Acute pulmonary edema: Secondary | ICD-10-CM | POA: Diagnosis not present

## 2019-07-31 DIAGNOSIS — I953 Hypotension of hemodialysis: Secondary | ICD-10-CM | POA: Diagnosis not present

## 2019-07-31 DIAGNOSIS — I132 Hypertensive heart and chronic kidney disease with heart failure and with stage 5 chronic kidney disease, or end stage renal disease: Secondary | ICD-10-CM | POA: Diagnosis not present

## 2019-07-31 DIAGNOSIS — E8770 Fluid overload, unspecified: Secondary | ICD-10-CM | POA: Diagnosis not present

## 2019-07-31 DIAGNOSIS — Z992 Dependence on renal dialysis: Secondary | ICD-10-CM | POA: Diagnosis not present

## 2019-07-31 DIAGNOSIS — N186 End stage renal disease: Secondary | ICD-10-CM | POA: Diagnosis not present

## 2019-07-31 DIAGNOSIS — I5041 Acute combined systolic (congestive) and diastolic (congestive) heart failure: Secondary | ICD-10-CM | POA: Diagnosis not present

## 2019-07-31 DIAGNOSIS — N2581 Secondary hyperparathyroidism of renal origin: Secondary | ICD-10-CM | POA: Diagnosis not present

## 2019-07-31 LAB — PROTIME-INR: INR: 3.5 — AB (ref 0.9–1.1)

## 2019-08-01 ENCOUNTER — Ambulatory Visit (INDEPENDENT_AMBULATORY_CARE_PROVIDER_SITE_OTHER): Payer: Medicare HMO | Admitting: Cardiovascular Disease

## 2019-08-01 DIAGNOSIS — I482 Chronic atrial fibrillation, unspecified: Secondary | ICD-10-CM

## 2019-08-01 DIAGNOSIS — Z515 Encounter for palliative care: Secondary | ICD-10-CM

## 2019-08-02 DIAGNOSIS — I5041 Acute combined systolic (congestive) and diastolic (congestive) heart failure: Secondary | ICD-10-CM | POA: Diagnosis not present

## 2019-08-02 DIAGNOSIS — J810 Acute pulmonary edema: Secondary | ICD-10-CM | POA: Diagnosis not present

## 2019-08-02 DIAGNOSIS — N186 End stage renal disease: Secondary | ICD-10-CM | POA: Diagnosis not present

## 2019-08-02 DIAGNOSIS — Z992 Dependence on renal dialysis: Secondary | ICD-10-CM | POA: Diagnosis not present

## 2019-08-02 DIAGNOSIS — I132 Hypertensive heart and chronic kidney disease with heart failure and with stage 5 chronic kidney disease, or end stage renal disease: Secondary | ICD-10-CM | POA: Diagnosis not present

## 2019-08-02 DIAGNOSIS — I953 Hypotension of hemodialysis: Secondary | ICD-10-CM | POA: Diagnosis not present

## 2019-08-02 DIAGNOSIS — N2581 Secondary hyperparathyroidism of renal origin: Secondary | ICD-10-CM | POA: Diagnosis not present

## 2019-08-02 DIAGNOSIS — E8770 Fluid overload, unspecified: Secondary | ICD-10-CM | POA: Diagnosis not present

## 2019-08-04 DIAGNOSIS — N186 End stage renal disease: Secondary | ICD-10-CM | POA: Diagnosis not present

## 2019-08-04 DIAGNOSIS — I953 Hypotension of hemodialysis: Secondary | ICD-10-CM | POA: Diagnosis not present

## 2019-08-04 DIAGNOSIS — E8770 Fluid overload, unspecified: Secondary | ICD-10-CM | POA: Diagnosis not present

## 2019-08-04 DIAGNOSIS — Z992 Dependence on renal dialysis: Secondary | ICD-10-CM | POA: Diagnosis not present

## 2019-08-04 DIAGNOSIS — J810 Acute pulmonary edema: Secondary | ICD-10-CM | POA: Diagnosis not present

## 2019-08-04 DIAGNOSIS — B20 Human immunodeficiency virus [HIV] disease: Secondary | ICD-10-CM | POA: Diagnosis not present

## 2019-08-04 DIAGNOSIS — N2581 Secondary hyperparathyroidism of renal origin: Secondary | ICD-10-CM | POA: Diagnosis not present

## 2019-08-04 DIAGNOSIS — I132 Hypertensive heart and chronic kidney disease with heart failure and with stage 5 chronic kidney disease, or end stage renal disease: Secondary | ICD-10-CM | POA: Diagnosis not present

## 2019-08-04 DIAGNOSIS — I5041 Acute combined systolic (congestive) and diastolic (congestive) heart failure: Secondary | ICD-10-CM | POA: Diagnosis not present

## 2019-08-05 DIAGNOSIS — Z992 Dependence on renal dialysis: Secondary | ICD-10-CM | POA: Diagnosis not present

## 2019-08-05 DIAGNOSIS — N186 End stage renal disease: Secondary | ICD-10-CM | POA: Diagnosis not present

## 2019-08-05 DIAGNOSIS — N2581 Secondary hyperparathyroidism of renal origin: Secondary | ICD-10-CM | POA: Diagnosis not present

## 2019-08-07 DIAGNOSIS — N2581 Secondary hyperparathyroidism of renal origin: Secondary | ICD-10-CM | POA: Diagnosis not present

## 2019-08-07 DIAGNOSIS — Z992 Dependence on renal dialysis: Secondary | ICD-10-CM | POA: Diagnosis not present

## 2019-08-07 DIAGNOSIS — N186 End stage renal disease: Secondary | ICD-10-CM | POA: Diagnosis not present

## 2019-08-09 DIAGNOSIS — Z992 Dependence on renal dialysis: Secondary | ICD-10-CM | POA: Diagnosis not present

## 2019-08-09 DIAGNOSIS — N186 End stage renal disease: Secondary | ICD-10-CM | POA: Diagnosis not present

## 2019-08-09 DIAGNOSIS — N2581 Secondary hyperparathyroidism of renal origin: Secondary | ICD-10-CM | POA: Diagnosis not present

## 2019-08-11 DIAGNOSIS — N186 End stage renal disease: Secondary | ICD-10-CM | POA: Diagnosis not present

## 2019-08-11 DIAGNOSIS — N2581 Secondary hyperparathyroidism of renal origin: Secondary | ICD-10-CM | POA: Diagnosis not present

## 2019-08-11 DIAGNOSIS — Z992 Dependence on renal dialysis: Secondary | ICD-10-CM | POA: Diagnosis not present

## 2019-08-12 DIAGNOSIS — N2581 Secondary hyperparathyroidism of renal origin: Secondary | ICD-10-CM | POA: Diagnosis not present

## 2019-08-12 DIAGNOSIS — N186 End stage renal disease: Secondary | ICD-10-CM | POA: Diagnosis not present

## 2019-08-12 DIAGNOSIS — Z992 Dependence on renal dialysis: Secondary | ICD-10-CM | POA: Diagnosis not present

## 2019-08-13 DIAGNOSIS — C61 Malignant neoplasm of prostate: Secondary | ICD-10-CM | POA: Diagnosis not present

## 2019-08-14 DIAGNOSIS — N2581 Secondary hyperparathyroidism of renal origin: Secondary | ICD-10-CM | POA: Diagnosis not present

## 2019-08-14 DIAGNOSIS — N186 End stage renal disease: Secondary | ICD-10-CM | POA: Diagnosis not present

## 2019-08-14 DIAGNOSIS — Z992 Dependence on renal dialysis: Secondary | ICD-10-CM | POA: Diagnosis not present

## 2019-08-15 LAB — POCT INR: INR: 2.2 (ref 2.0–3.0)

## 2019-08-16 DIAGNOSIS — N2581 Secondary hyperparathyroidism of renal origin: Secondary | ICD-10-CM | POA: Diagnosis not present

## 2019-08-16 DIAGNOSIS — N186 End stage renal disease: Secondary | ICD-10-CM | POA: Diagnosis not present

## 2019-08-16 DIAGNOSIS — Z992 Dependence on renal dialysis: Secondary | ICD-10-CM | POA: Diagnosis not present

## 2019-08-18 ENCOUNTER — Ambulatory Visit (INDEPENDENT_AMBULATORY_CARE_PROVIDER_SITE_OTHER): Payer: Medicare HMO | Admitting: Pharmacist Clinician (PhC)/ Clinical Pharmacy Specialist

## 2019-08-18 DIAGNOSIS — Z7901 Long term (current) use of anticoagulants: Secondary | ICD-10-CM | POA: Diagnosis not present

## 2019-08-18 DIAGNOSIS — I482 Chronic atrial fibrillation, unspecified: Secondary | ICD-10-CM | POA: Diagnosis not present

## 2019-08-18 DIAGNOSIS — Z515 Encounter for palliative care: Secondary | ICD-10-CM

## 2019-08-19 DIAGNOSIS — N2581 Secondary hyperparathyroidism of renal origin: Secondary | ICD-10-CM | POA: Diagnosis not present

## 2019-08-19 DIAGNOSIS — N186 End stage renal disease: Secondary | ICD-10-CM | POA: Diagnosis not present

## 2019-08-19 DIAGNOSIS — Z992 Dependence on renal dialysis: Secondary | ICD-10-CM | POA: Diagnosis not present

## 2019-08-20 DIAGNOSIS — N5201 Erectile dysfunction due to arterial insufficiency: Secondary | ICD-10-CM | POA: Diagnosis not present

## 2019-08-20 DIAGNOSIS — C61 Malignant neoplasm of prostate: Secondary | ICD-10-CM | POA: Diagnosis not present

## 2019-08-21 DIAGNOSIS — N186 End stage renal disease: Secondary | ICD-10-CM | POA: Diagnosis not present

## 2019-08-21 DIAGNOSIS — Z992 Dependence on renal dialysis: Secondary | ICD-10-CM | POA: Diagnosis not present

## 2019-08-21 DIAGNOSIS — N2581 Secondary hyperparathyroidism of renal origin: Secondary | ICD-10-CM | POA: Diagnosis not present

## 2019-08-21 LAB — POCT INR: INR: 3.2 — AB (ref 2.0–3.0)

## 2019-08-23 DIAGNOSIS — Z992 Dependence on renal dialysis: Secondary | ICD-10-CM | POA: Diagnosis not present

## 2019-08-23 DIAGNOSIS — N186 End stage renal disease: Secondary | ICD-10-CM | POA: Diagnosis not present

## 2019-08-23 DIAGNOSIS — N2581 Secondary hyperparathyroidism of renal origin: Secondary | ICD-10-CM | POA: Diagnosis not present

## 2019-08-25 ENCOUNTER — Ambulatory Visit (INDEPENDENT_AMBULATORY_CARE_PROVIDER_SITE_OTHER): Payer: Medicare HMO | Admitting: Pharmacist Clinician (PhC)/ Clinical Pharmacy Specialist

## 2019-08-25 DIAGNOSIS — Z7901 Long term (current) use of anticoagulants: Secondary | ICD-10-CM

## 2019-08-25 DIAGNOSIS — Z515 Encounter for palliative care: Secondary | ICD-10-CM

## 2019-08-25 DIAGNOSIS — I482 Chronic atrial fibrillation, unspecified: Secondary | ICD-10-CM

## 2019-08-26 DIAGNOSIS — N186 End stage renal disease: Secondary | ICD-10-CM | POA: Diagnosis not present

## 2019-08-26 DIAGNOSIS — N2581 Secondary hyperparathyroidism of renal origin: Secondary | ICD-10-CM | POA: Diagnosis not present

## 2019-08-26 DIAGNOSIS — Z992 Dependence on renal dialysis: Secondary | ICD-10-CM | POA: Diagnosis not present

## 2019-08-28 DIAGNOSIS — N2581 Secondary hyperparathyroidism of renal origin: Secondary | ICD-10-CM | POA: Diagnosis not present

## 2019-08-28 DIAGNOSIS — N186 End stage renal disease: Secondary | ICD-10-CM | POA: Diagnosis not present

## 2019-08-28 DIAGNOSIS — Z992 Dependence on renal dialysis: Secondary | ICD-10-CM | POA: Diagnosis not present

## 2019-08-29 ENCOUNTER — Other Ambulatory Visit: Payer: Self-pay | Admitting: Podiatry

## 2019-08-29 ENCOUNTER — Ambulatory Visit (INDEPENDENT_AMBULATORY_CARE_PROVIDER_SITE_OTHER): Payer: Medicare HMO | Admitting: Podiatry

## 2019-08-29 ENCOUNTER — Encounter: Payer: Self-pay | Admitting: Podiatry

## 2019-08-29 ENCOUNTER — Ambulatory Visit (INDEPENDENT_AMBULATORY_CARE_PROVIDER_SITE_OTHER): Payer: Medicare HMO

## 2019-08-29 ENCOUNTER — Other Ambulatory Visit: Payer: Self-pay

## 2019-08-29 VITALS — BP 133/80 | HR 70 | Resp 16

## 2019-08-29 DIAGNOSIS — B351 Tinea unguium: Secondary | ICD-10-CM | POA: Diagnosis not present

## 2019-08-29 DIAGNOSIS — M79671 Pain in right foot: Secondary | ICD-10-CM

## 2019-08-29 DIAGNOSIS — L84 Corns and callosities: Secondary | ICD-10-CM

## 2019-08-29 DIAGNOSIS — M2042 Other hammer toe(s) (acquired), left foot: Secondary | ICD-10-CM

## 2019-08-29 DIAGNOSIS — M79672 Pain in left foot: Secondary | ICD-10-CM

## 2019-08-29 DIAGNOSIS — M2041 Other hammer toe(s) (acquired), right foot: Secondary | ICD-10-CM

## 2019-08-29 NOTE — Progress Notes (Signed)
  Subjective:  Patient ID: David [name], male    DOB: 1941-11-13,  MRN: [id number]  Chief Complaint  Patient presents with  . Toe Pain    2nd toe right (medial aspect) - tender, callused area x 3 months, callused area hallux (lateral aspect) but says not sore, toes rubbing together, no treatment  . New Patient (Initial Visit)   78 y.o. male returns for the above complaint.  Patient states that the callus between the first and second digit is painful in nature.  He states he has not tried any treatment for the pain.  He ambulates in regular sneakers with a cane today.  Denies any nausea fever chills vomiting shortness of breath.  Objective:   Vitals:   08/29/19 1314  BP: 133/80  Pulse: 70  Resp: 16   General AA&O x3. Normal mood and affect.  Vascular Pedal pulses palpable.  Neurologic Epicritic sensation grossly intact.  Dermatologic No open lesions. Skin normal texture and turgor. Toenails x 10 elongated, thickened, dystrophic.  Orthopedic: Pain to palpation about the toenails.   Assessment & Plan:  Patient was evaluated and treated and all questions answered.  Heloma mole between 1st/2nd right foot   -The skin lesion was debrided down to healthy tissue. No complications noted.    - Toe protector was given to patient to help alleviate the lesion formation  Onychomycosis with pain  -Nails palliatively debrided as below. -Educated on self-care  Procedure: Nail Debridement Rationale: pain  Type of Debridement: manual, sharp debridement. Instrumentation: Nail nipper, rotary burr. Number of Nails: 10     No follow-ups on file.

## 2019-08-30 DIAGNOSIS — N2581 Secondary hyperparathyroidism of renal origin: Secondary | ICD-10-CM | POA: Diagnosis not present

## 2019-08-30 DIAGNOSIS — N186 End stage renal disease: Secondary | ICD-10-CM | POA: Diagnosis not present

## 2019-08-30 DIAGNOSIS — Z992 Dependence on renal dialysis: Secondary | ICD-10-CM | POA: Diagnosis not present

## 2019-09-01 ENCOUNTER — Ambulatory Visit: Payer: Medicare HMO | Admitting: Podiatry

## 2019-09-01 ENCOUNTER — Encounter: Payer: Self-pay | Admitting: Podiatry

## 2019-09-02 ENCOUNTER — Emergency Department (HOSPITAL_COMMUNITY): Payer: Medicare HMO

## 2019-09-02 ENCOUNTER — Encounter (HOSPITAL_COMMUNITY): Payer: Self-pay

## 2019-09-02 ENCOUNTER — Inpatient Hospital Stay (HOSPITAL_COMMUNITY)
Admission: EM | Admit: 2019-09-02 | Discharge: 2019-09-03 | DRG: 189 | Disposition: A | Discharge status: Home / Self Care | Payer: Medicare HMO | Attending: Family Medicine | Admitting: Family Medicine

## 2019-09-02 ENCOUNTER — Other Ambulatory Visit: Payer: Self-pay

## 2019-09-02 DIAGNOSIS — I953 Hypotension of hemodialysis: Secondary | ICD-10-CM | POA: Diagnosis present

## 2019-09-02 DIAGNOSIS — R011 Cardiac murmur, unspecified: Secondary | ICD-10-CM | POA: Diagnosis present

## 2019-09-02 DIAGNOSIS — I480 Paroxysmal atrial fibrillation: Secondary | ICD-10-CM | POA: Diagnosis not present

## 2019-09-02 DIAGNOSIS — E875 Hyperkalemia: Secondary | ICD-10-CM | POA: Diagnosis present

## 2019-09-02 DIAGNOSIS — E785 Hyperlipidemia, unspecified: Secondary | ICD-10-CM | POA: Diagnosis present

## 2019-09-02 DIAGNOSIS — J449 Chronic obstructive pulmonary disease, unspecified: Secondary | ICD-10-CM | POA: Diagnosis present

## 2019-09-02 DIAGNOSIS — D509 Iron deficiency anemia, unspecified: Secondary | ICD-10-CM | POA: Diagnosis present

## 2019-09-02 DIAGNOSIS — I959 Hypotension, unspecified: Secondary | ICD-10-CM | POA: Diagnosis not present

## 2019-09-02 DIAGNOSIS — M19071 Primary osteoarthritis, right ankle and foot: Secondary | ICD-10-CM | POA: Diagnosis present

## 2019-09-02 DIAGNOSIS — B20 Human immunodeficiency virus [HIV] disease: Secondary | ICD-10-CM | POA: Diagnosis not present

## 2019-09-02 DIAGNOSIS — R000 Tachycardia, unspecified: Secondary | ICD-10-CM | POA: Diagnosis present

## 2019-09-02 DIAGNOSIS — I248 Other forms of acute ischemic heart disease: Secondary | ICD-10-CM | POA: Diagnosis not present

## 2019-09-02 DIAGNOSIS — I2510 Atherosclerotic heart disease of native coronary artery without angina pectoris: Secondary | ICD-10-CM | POA: Diagnosis present

## 2019-09-02 DIAGNOSIS — Z20828 Contact with and (suspected) exposure to other viral communicable diseases: Secondary | ICD-10-CM | POA: Diagnosis not present

## 2019-09-02 DIAGNOSIS — R7989 Other specified abnormal findings of blood chemistry: Secondary | ICD-10-CM

## 2019-09-02 DIAGNOSIS — R0602 Shortness of breath: Secondary | ICD-10-CM

## 2019-09-02 DIAGNOSIS — R0902 Hypoxemia: Secondary | ICD-10-CM | POA: Diagnosis not present

## 2019-09-02 DIAGNOSIS — Z8546 Personal history of malignant neoplasm of prostate: Secondary | ICD-10-CM

## 2019-09-02 DIAGNOSIS — Z888 Allergy status to other drugs, medicaments and biological substances status: Secondary | ICD-10-CM

## 2019-09-02 DIAGNOSIS — Z7901 Long term (current) use of anticoagulants: Secondary | ICD-10-CM | POA: Diagnosis not present

## 2019-09-02 DIAGNOSIS — D631 Anemia in chronic kidney disease: Secondary | ICD-10-CM | POA: Diagnosis not present

## 2019-09-02 DIAGNOSIS — J811 Chronic pulmonary edema: Secondary | ICD-10-CM | POA: Diagnosis present

## 2019-09-02 DIAGNOSIS — Z21 Asymptomatic human immunodeficiency virus [HIV] infection status: Secondary | ICD-10-CM | POA: Diagnosis not present

## 2019-09-02 DIAGNOSIS — M19041 Primary osteoarthritis, right hand: Secondary | ICD-10-CM | POA: Diagnosis present

## 2019-09-02 DIAGNOSIS — S37009A Unspecified injury of unspecified kidney, initial encounter: Secondary | ICD-10-CM | POA: Diagnosis not present

## 2019-09-02 DIAGNOSIS — I428 Other cardiomyopathies: Secondary | ICD-10-CM | POA: Diagnosis present

## 2019-09-02 DIAGNOSIS — J9601 Acute respiratory failure with hypoxia: Secondary | ICD-10-CM | POA: Diagnosis present

## 2019-09-02 DIAGNOSIS — I43 Cardiomyopathy in diseases classified elsewhere: Secondary | ICD-10-CM | POA: Diagnosis not present

## 2019-09-02 DIAGNOSIS — K219 Gastro-esophageal reflux disease without esophagitis: Secondary | ICD-10-CM | POA: Diagnosis present

## 2019-09-02 DIAGNOSIS — I132 Hypertensive heart and chronic kidney disease with heart failure and with stage 5 chronic kidney disease, or end stage renal disease: Secondary | ICD-10-CM | POA: Diagnosis not present

## 2019-09-02 DIAGNOSIS — Z8249 Family history of ischemic heart disease and other diseases of the circulatory system: Secondary | ICD-10-CM

## 2019-09-02 DIAGNOSIS — K029 Dental caries, unspecified: Secondary | ICD-10-CM | POA: Diagnosis present

## 2019-09-02 DIAGNOSIS — M19042 Primary osteoarthritis, left hand: Secondary | ICD-10-CM | POA: Diagnosis present

## 2019-09-02 DIAGNOSIS — R069 Unspecified abnormalities of breathing: Secondary | ICD-10-CM | POA: Diagnosis not present

## 2019-09-02 DIAGNOSIS — G4730 Sleep apnea, unspecified: Secondary | ICD-10-CM | POA: Diagnosis present

## 2019-09-02 DIAGNOSIS — Z209 Contact with and (suspected) exposure to unspecified communicable disease: Secondary | ICD-10-CM | POA: Diagnosis not present

## 2019-09-02 DIAGNOSIS — Z79899 Other long term (current) drug therapy: Secondary | ICD-10-CM

## 2019-09-02 DIAGNOSIS — N186 End stage renal disease: Secondary | ICD-10-CM | POA: Diagnosis present

## 2019-09-02 DIAGNOSIS — G8929 Other chronic pain: Secondary | ICD-10-CM | POA: Diagnosis present

## 2019-09-02 DIAGNOSIS — M545 Low back pain: Secondary | ICD-10-CM | POA: Diagnosis present

## 2019-09-02 DIAGNOSIS — Z8673 Personal history of transient ischemic attack (TIA), and cerebral infarction without residual deficits: Secondary | ICD-10-CM

## 2019-09-02 DIAGNOSIS — M19072 Primary osteoarthritis, left ankle and foot: Secondary | ICD-10-CM | POA: Diagnosis present

## 2019-09-02 DIAGNOSIS — Z87891 Personal history of nicotine dependence: Secondary | ICD-10-CM

## 2019-09-02 DIAGNOSIS — Z992 Dependence on renal dialysis: Secondary | ICD-10-CM

## 2019-09-02 DIAGNOSIS — J810 Acute pulmonary edema: Principal | ICD-10-CM

## 2019-09-02 DIAGNOSIS — I5042 Chronic combined systolic (congestive) and diastolic (congestive) heart failure: Secondary | ICD-10-CM | POA: Diagnosis present

## 2019-09-02 DIAGNOSIS — I447 Left bundle-branch block, unspecified: Secondary | ICD-10-CM | POA: Diagnosis not present

## 2019-09-02 DIAGNOSIS — Z8674 Personal history of sudden cardiac arrest: Secondary | ICD-10-CM

## 2019-09-02 DIAGNOSIS — M109 Gout, unspecified: Secondary | ICD-10-CM | POA: Diagnosis present

## 2019-09-02 DIAGNOSIS — M1711 Unilateral primary osteoarthritis, right knee: Secondary | ICD-10-CM | POA: Diagnosis present

## 2019-09-02 LAB — COMPREHENSIVE METABOLIC PANEL
ALT: 386 U/L — ABNORMAL HIGH (ref 0–44)
AST: 214 U/L — ABNORMAL HIGH (ref 15–41)
Albumin: 4.1 g/dL (ref 3.5–5.0)
Alkaline Phosphatase: 440 U/L — ABNORMAL HIGH (ref 38–126)
Anion gap: 16 — ABNORMAL HIGH (ref 5–15)
BUN: 79 mg/dL — ABNORMAL HIGH (ref 8–23)
CO2: 24 mmol/L (ref 22–32)
Calcium: 9.5 mg/dL (ref 8.9–10.3)
Chloride: 100 mmol/L (ref 98–111)
Creatinine, Ser: 11.67 mg/dL — ABNORMAL HIGH (ref 0.61–1.24)
GFR calc Af Amer: 4 mL/min — ABNORMAL LOW (ref >60)
GFR calc non Af Amer: 4 mL/min — ABNORMAL LOW (ref >60)
Glucose, Bld: 128 mg/dL — ABNORMAL HIGH (ref 70–99)
Potassium: 5.3 mmol/L — ABNORMAL HIGH (ref 3.5–5.1)
Sodium: 140 mmol/L (ref 135–145)
Total Bilirubin: 0.8 mg/dL (ref 0.3–1.2)
Total Protein: 7.8 g/dL (ref 6.5–8.1)

## 2019-09-02 LAB — CBC WITH DIFFERENTIAL/PLATELET
Abs Immature Granulocytes: 0.02 10*3/uL (ref 0.00–0.07)
Basophils Absolute: 0.1 10*3/uL (ref 0.0–0.1)
Basophils Relative: 1 %
Eosinophils Absolute: 1.4 10*3/uL — ABNORMAL HIGH (ref 0.0–0.5)
Eosinophils Relative: 14 %
HCT: 37.3 % — ABNORMAL LOW (ref 39.0–52.0)
Hemoglobin: 11.9 g/dL — ABNORMAL LOW (ref 13.0–17.0)
Immature Granulocytes: 0 %
Lymphocytes Relative: 17 %
Lymphs Abs: 1.7 10*3/uL (ref 0.7–4.0)
MCH: 37.3 pg — ABNORMAL HIGH (ref 26.0–34.0)
MCHC: 31.9 g/dL (ref 30.0–36.0)
MCV: 116.9 fL — ABNORMAL HIGH (ref 80.0–100.0)
Monocytes Absolute: 0.7 10*3/uL (ref 0.1–1.0)
Monocytes Relative: 6 %
Neutro Abs: 6.4 10*3/uL (ref 1.7–7.7)
Neutrophils Relative %: 62 %
Platelets: 259 10*3/uL (ref 150–400)
RBC: 3.19 MIL/uL — ABNORMAL LOW (ref 4.22–5.81)
RDW: 15.4 % (ref 11.5–15.5)
WBC: 10.3 10*3/uL (ref 4.0–10.5)
nRBC: 0 % (ref 0.0–0.2)

## 2019-09-02 LAB — PROTIME-INR
INR: 2.4 — ABNORMAL HIGH (ref 0.8–1.2)
Prothrombin Time: 26.1 seconds — ABNORMAL HIGH (ref 11.4–15.2)

## 2019-09-02 LAB — TROPONIN I (HIGH SENSITIVITY): Troponin I (High Sensitivity): 75 ng/L — ABNORMAL HIGH (ref <18)

## 2019-09-02 LAB — SARS CORONAVIRUS 2 BY RT PCR (HOSPITAL ORDER, PERFORMED IN ~~LOC~~ HOSPITAL LAB): SARS Coronavirus 2: NEGATIVE

## 2019-09-02 MED ORDER — NITROGLYCERIN IN D5W 200-5 MCG/ML-% IV SOLN
0.0000 ug/min | INTRAVENOUS | Status: DC
  Administered 2019-09-02: 33.333 ug/min via INTRAVENOUS
  Filled 2019-09-02: qty 250

## 2019-09-02 MED ORDER — LIDOCAINE-PRILOCAINE 2.5-2.5 % EX CREA
1.0000 "application " | TOPICAL_CREAM | CUTANEOUS | Status: DC | PRN
  Filled 2019-09-02: qty 5

## 2019-09-02 MED ORDER — SODIUM CHLORIDE 0.9 % IV SOLN: 100.0000 mL | INTRAVENOUS | Status: DC | PRN

## 2019-09-02 MED ORDER — NITROGLYCERIN 0.4 MG SL SUBL: 0.4000 mg | SUBLINGUAL_TABLET | SUBLINGUAL | Status: DC | PRN

## 2019-09-02 MED ORDER — ONDANSETRON HCL 4 MG/2ML IJ SOLN: 4.0000 mg | Freq: Four times a day (QID) | INTRAMUSCULAR | Status: DC | PRN

## 2019-09-02 MED ORDER — ACETAMINOPHEN 325 MG PO TABS: 650.0000 mg | ORAL_TABLET | Freq: Four times a day (QID) | ORAL | Status: DC | PRN

## 2019-09-02 MED ORDER — BICTEGRAVIR-EMTRICITAB-TENOFOV 50-200-25 MG PO TABS
1.0000 | ORAL_TABLET | Freq: Every day | ORAL | Status: DC
  Administered 2019-09-02: 1 via ORAL
  Filled 2019-09-02 (×3): qty 1

## 2019-09-02 MED ORDER — SODIUM CHLORIDE 0.9% FLUSH
3.0000 mL | Freq: Two times a day (BID) | INTRAVENOUS | Status: DC
  Administered 2019-09-02 – 2019-09-03 (×3): 3 mL via INTRAVENOUS

## 2019-09-02 MED ORDER — CHLORHEXIDINE GLUCONATE CLOTH 2 % EX PADS
6.0000 | MEDICATED_PAD | Freq: Every day | CUTANEOUS | Status: DC
  Administered 2019-09-03: 6 via TOPICAL

## 2019-09-02 MED ORDER — AMIODARONE HCL 200 MG PO TABS
200.0000 mg | ORAL_TABLET | Freq: Every day | ORAL | Status: DC
  Administered 2019-09-02: 200 mg via ORAL
  Filled 2019-09-02 (×2): qty 1

## 2019-09-02 MED ORDER — MIDODRINE HCL 5 MG PO TABS
ORAL_TABLET | ORAL | Status: AC
  Administered 2019-09-02: 10:00:00 10 mg via ORAL
  Filled 2019-09-02: qty 2

## 2019-09-02 MED ORDER — ACETAMINOPHEN 650 MG RE SUPP: 650.0000 mg | Freq: Four times a day (QID) | RECTAL | Status: DC | PRN

## 2019-09-02 MED ORDER — AMOXICILLIN 500 MG PO CAPS: 500.0000 mg | ORAL_CAPSULE | ORAL | Status: DC

## 2019-09-02 MED ORDER — BRIMONIDINE TARTRATE 0.15 % OP SOLN
1.0000 [drp] | Freq: Two times a day (BID) | OPHTHALMIC | Status: DC
  Administered 2019-09-02: 1 [drp] via OPHTHALMIC
  Filled 2019-09-02: qty 5

## 2019-09-02 MED ORDER — PENTAFLUOROPROP-TETRAFLUOROETH EX AERO
1.0000 "application " | INHALATION_SPRAY | CUTANEOUS | Status: DC | PRN
  Filled 2019-09-02: qty 116

## 2019-09-02 MED ORDER — HEPARIN SODIUM (PORCINE) 1000 UNIT/ML DIALYSIS
1000.0000 [IU] | INTRAMUSCULAR | Status: DC | PRN
  Filled 2019-09-02: qty 1

## 2019-09-02 MED ORDER — ALLOPURINOL 100 MG PO TABS
100.0000 mg | ORAL_TABLET | Freq: Every day | ORAL | Status: DC
  Administered 2019-09-02: 100 mg via ORAL
  Filled 2019-09-02: qty 1

## 2019-09-02 MED ORDER — ALBUTEROL SULFATE (2.5 MG/3ML) 0.083% IN NEBU: 2.5000 mg | INHALATION_SOLUTION | RESPIRATORY_TRACT | Status: DC | PRN

## 2019-09-02 MED ORDER — FERRIC CITRATE 1 GM 210 MG(FE) PO TABS
420.0000 mg | ORAL_TABLET | Freq: Three times a day (TID) | ORAL | Status: DC
  Administered 2019-09-02: 420 mg via ORAL
  Filled 2019-09-02 (×6): qty 2

## 2019-09-02 MED ORDER — WARFARIN - PHARMACIST DOSING INPATIENT: Freq: Every day | Status: DC

## 2019-09-02 MED ORDER — WARFARIN SODIUM 5 MG PO TABS
5.0000 mg | ORAL_TABLET | Freq: Once | ORAL | Status: AC
  Administered 2019-09-02: 5 mg via ORAL
  Filled 2019-09-02 (×2): qty 1

## 2019-09-02 MED ORDER — MIDODRINE HCL 5 MG PO TABS
10.0000 mg | ORAL_TABLET | ORAL | Status: DC
  Administered 2019-09-02: 10:00:00 10 mg via ORAL

## 2019-09-02 MED ORDER — ONDANSETRON HCL 4 MG PO TABS: 4.0000 mg | ORAL_TABLET | Freq: Four times a day (QID) | ORAL | Status: DC | PRN

## 2019-09-02 MED ORDER — ALTEPLASE 2 MG IJ SOLR: 2.0000 mg | Freq: Once | INTRAMUSCULAR | Status: DC | PRN

## 2019-09-02 MED ORDER — AMOXICILLIN 500 MG PO CAPS: 1000.0000 mg | ORAL_CAPSULE | ORAL | Status: DC

## 2019-09-02 MED ORDER — LATANOPROST 0.005 % OP SOLN
1.0000 [drp] | Freq: Every day | OPHTHALMIC | Status: DC
  Administered 2019-09-02: 1 [drp] via OPHTHALMIC
  Filled 2019-09-02: qty 2.5

## 2019-09-02 MED ORDER — RENA-VITE PO TABS
1.0000 | ORAL_TABLET | Freq: Every day | ORAL | Status: DC
  Administered 2019-09-02: 1 via ORAL
  Filled 2019-09-02: qty 1

## 2019-09-02 MED ORDER — NITROGLYCERIN 0.1 MG/HR TD PT24
0.1000 mg | MEDICATED_PATCH | Freq: Once | TRANSDERMAL | Status: DC
  Administered 2019-09-02: 0.1 mg via TRANSDERMAL
  Filled 2019-09-02: qty 1

## 2019-09-02 MED ORDER — DORZOLAMIDE HCL-TIMOLOL MAL 2-0.5 % OP SOLN
1.0000 [drp] | Freq: Two times a day (BID) | OPHTHALMIC | Status: DC
  Administered 2019-09-02: 1 [drp] via OPHTHALMIC
  Filled 2019-09-02: qty 10

## 2019-09-02 MED ORDER — HEPARIN SODIUM (PORCINE) 1000 UNIT/ML IJ SOLN
INTRAMUSCULAR | Status: AC
  Filled 2019-09-02: qty 1

## 2019-09-02 MED ORDER — FUROSEMIDE 10 MG/ML IJ SOLN
120.0000 mg | Freq: Once | INTRAVENOUS | Status: AC
  Administered 2019-09-02: 120 mg via INTRAVENOUS
  Filled 2019-09-02: qty 10

## 2019-09-02 MED ORDER — MIDODRINE HCL 5 MG PO TABS
10.0000 mg | ORAL_TABLET | ORAL | Status: DC
  Administered 2019-09-02: 10 mg via ORAL
  Filled 2019-09-02: qty 2

## 2019-09-02 MED ORDER — LIDOCAINE HCL (PF) 1 % IJ SOLN: 5.0000 mL | INTRAMUSCULAR | Status: DC | PRN

## 2019-09-02 MED ORDER — FUROSEMIDE 10 MG/ML IJ SOLN
40.0000 mg | INTRAMUSCULAR | Status: AC
  Administered 2019-09-02: 40 mg via INTRAVENOUS
  Filled 2019-09-02: qty 4

## 2019-09-02 NOTE — ED Provider Notes (Addendum)
[facility] DEPT Provider Note   CSN: [id number] Arrival date & time: 09/02/19  0310     History   Chief Complaint Chief Complaint  Patient presents with  . Shortness of Breath    HPI [place] is a 78 y.o. male.     The history is provided by the patient and medical records.  Shortness of Breath    78 y.o. 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  Patient recently was decreased from 4x weekly dialysis to 3x weekly as he was doing well better.  He now gets treatments on Tuesday, Thursday, and Saturday (previsouly on Monday too) and has not missed any recent treatment.  He was also taken off of Lasix 2 weeks ago.  States he has had worsening shortness of breath, particularly over the past 24 hours.  He denies any cough, fever, sick contacts or known COVID exposures.  He was in the mid 80s upon EMS arrival on room air, he did improve with supplemental oxygen.  Patient is hypertensive here, states his blood pressure is usually low in the 37T systolic.  He does take midodrine on days he receives dialysis.  Past Medical History:  Diagnosis Date  . 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 Acute on chronic systolic and diastolic heart failure, NYHA class 4 ([place])   . Anemia, iron deficiency 11/15/2011  . Arthritis    "hands, right knee, feet" (02/21/2018)  . Atrial fibrillation ([place])   . Cancer [facility])    hx of prostate; s/p radioactive seed implant 10/2009 Dr Janice Norrie  . Cardiac arrest ([place]) 02/17/2018  . CHF (congestive heart failure) ([place])   . Chronic combined systolic and diastolic heart failure, NYHA class 3 ([place]) 06/2010   felt to be secondary to hypertensive cardiomyopathy  . Chronic lower back pain   . CKD (chronic kidney disease) stage V requiring chronic dialysis ([place])   . ESRD on dialysis [facility])    started 02/2018  [place]  . GERD (gastroesophageal reflux disease)   . Glaucoma    "I had surgery and dont have it any more"  . Gout    daily RX (02/21/2018)  . Heart murmur    "mild" per pt  . Hepatitis    years ago  . HIV (human immunodeficiency virus infection) ([place])   . HIV infection ([place])   . Hyperlipidemia   . Hypertension    followed by [facility] and Vascular (Dr Dani Gobble Croitoru)  . Hypertension   . Nonischemic cardiomyopathy ([place])   . Pneumonia 11/2017  . Prostate cancer ([place])   . Sinus bradycardia   . Sleep apnea    does not use a cpap  . Stroke [facility])    "mini stroke" years ago    Patient Active Problem List   Diagnosis Date Noted  . Unspecified open wound of right upper arm, initial encounter 06/21/2019  . Shortness of breath 04/12/2019  . 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 Dependence on renal dialysis ([place]) 03/27/2019  . Hypertensive heart and chronic kidney disease with heart failure and with stage 5 chronic kidney disease, or end stage renal disease ([place]) 03/27/2019  . Mesenteric ischemia ([place])   . Colonic ulcer   . Right lower quadrant pain   . Abnormal CT of the abdomen   . Ischemic colitis ([place])   . Colitis presumed  infectious 03/09/2019  . Acute respiratory failure with hypoxia and hypercapnia ([place]) 03/04/2019  . Pruritus, unspecified 02/21/2019  . Acute pulmonary edema ([place]) 02/11/2019  . Pain, unspecified 01/21/2019  . Atrial fibrillation with RVR ([place]) 12/05/2018  . Paroxysmal atrial fibrillation ([place]) 11/16/2018  . LBBB (left bundle branch block) 11/16/2018  . Encounter for monitoring amiodarone therapy 11/16/2018  . Volume overload 11/01/2018  . Pulmonary edema 10/21/2018  . Gout 10/21/2018  . Hypertensive emergency 10/21/2018  . Chronic combined systolic and diastolic heart failure, NYHA class 3 ([place]) 10/21/2018  . Leukocytosis 10/21/2018  . Chills (without fever) 09/24/2018  . Gastroesophageal reflux disease  08/28/2018  . Chronic anticoagulation 04/03/2018  . CAD (coronary artery disease) 04/03/2018  . Palliative care encounter 03/27/2018  . Atrial fibrillation, chronic 03/25/2018  . Anemia in chronic kidney disease 03/12/2018  . Coagulation defect, unspecified ([place]) 03/12/2018  . Secondary hyperparathyroidism of renal origin ([place]) 03/12/2018  . Palliative care by specialist   . Advance care planning   . Goals of care, counseling/discussion   . Respiratory arrest ([place])   . Acute on chronic systolic heart failure ([place])   . Chronic obstructive pulmonary disease ([place])   . HIV (human immunodeficiency virus infection) ([place])   . ESRD (end stage renal disease) ([place])   . Acute respiratory failure with hypoxia ([place]) 01/21/2018  . Pneumonia   . Aspiration pneumonia of both lower lobes due to regurgitated food ([place])   . Sepsis due to pneumonia ([place]) 11/22/2017  . AIDS ([place]) 05/14/2014  . 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 Gastroparesis 05/06/2014  . Protein-calorie malnutrition, severe ([place]) 05/05/2014  . ESRD on hemodialysis ([place]) 05/02/2014  . Hemodialysis-associated hypotension 05/02/2014  . Leaking of conjunctival drainage bleb 05/01/2014  . POAG (primary open-angle glaucoma) 05/01/2014  . Loss of weight 04/29/2014  . Conjunctivitis 04/29/2014  . 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 Low back pain 04/09/2012  . Osteoarthritis of hip 12/29/2011  . Iron deficiency anemia 06/28/2011  . Macrocytic anemia 04/02/2011  . 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 Hyperlipidemia 11/30/2010  . Transient cerebral ischemia 11/30/2010  . HYPERGLYCEMIA 11/30/2010    Past Surgical History:  Procedure Laterality Date  . AV FISTULA PLACEMENT Right 10/13/2016   Procedure: ARTERIOVENOUS (AV) FISTULA CREATION;  Surgeon: [name], MD;  Location: [facility];  Service: Vascular;  Laterality: Right;  . AV FISTULA PLACEMENT Left 02/25/2018   Procedure: INSERTION OF ARTERIOVENOUS (AV)  GORE-TEX GRAFT LEFT UPPER ARM;  Surgeon: Angelia Mould, MD;  Location: [place];  Service: Vascular;  Laterality: Left;  . AV FISTULA PLACEMENT Right 08/07/2018   Procedure: Creation of right arm brachiocephalic Fistula;  Surgeon: Waynetta Sandy, MD;  Location: [place];  Service: Vascular;  Laterality: Right;  . BASCILIC VEIN TRANSPOSITION Left 06/24/2015   Procedure: BASILIC VEIN TRANSPOSITION;  Surgeon: Mal Misty, MD;  Location: [facility];  Service: Vascular;  Laterality: Left;  . BIOPSY  03/14/2019   Procedure: BIOPSY;  Surgeon: Irene Shipper, MD;  Location: [place];  Service: Endoscopy;;  . CATARACT EXTRACTION, BILATERAL Bilateral   . COLONOSCOPY WITH PROPOFOL N/A 03/14/2019   Procedure: COLONOSCOPY WITH PROPOFOL;  Surgeon: Irene Shipper, MD;  Location[facility] ENDOSCOPY;  Service: Endoscopy;  Laterality: N/A;  . ESOPHAGOGASTRODUODENOSCOPY (EGD) WITH PROPOFOL N/A 05/05/2014   Procedure: ESOPHAGOGASTRODUODENOSCOPY (EGD) WITH PROPOFOL;  Surgeon: Missy Sabins, MD;  Location: [facility] ENDOSCOPY;  Service: Endoscopy;  Laterality: N/A;  . EYE SURGERY Bilateral  cataract surgery   . GLAUCOMA SURGERY Bilateral   . INSERTION OF ARTERIOVENOUS (AV) ARTEGRAFT ARM  10/09/2018   Procedure: INSERTION OF ARTERIOVENOUS (AV) 96mm x 41cm ARTEGRAFT LEFT UPPER ARM;  Surgeon: Waynetta Sandy, MD;  Location: [place];  Service: Vascular;;  . INSERTION OF DIALYSIS CATHETER Right 07/14/2019   Procedure: Insertion Of Dialysis Catheter;  Surgeon: Elam Dutch, MD;  Location: [facility] OR;  Service: Vascular;  Laterality: Right;  placed right Internal Jugular  . INSERTION PROSTATE RADIATION SEED    . IR FLUORO GUIDE CV LINE RIGHT  02/18/2018  . IR REMOVAL TUN CV CATH W/O FL  12/06/2018  . IR US GUIDE VASC ACCESS RIGHT  02/18/2018  . LEFT HEART CATH AND CORONARY ANGIOGRAPHY N/A 02/20/2018   Procedure: LEFT HEART CATH AND CORONARY ANGIOGRAPHY;  Surgeon: Jettie Booze, MD;  Location: [facility] CV LAB;;; 50%  ostial OM2, 25% mLAD.  Marland Kitchen LEFT HEART CATH AND CORONARY ANGIOGRAPHY  2004   mildly depressed LV systolic fx EF 25%,ENIDPO coronaries/abdominal aorta/renal arteries.  Marland Kitchen NM MYOCAR PERF WALL MOTION  02/21/2010   No ischemia or infarction.  EF 27%.  Marland Kitchen RADIOACTIVE SEED IMPLANT  2010   prostate cancer  . REVISION OF ARTERIOVENOUS GORETEX GRAFT Right 07/11/2019   Procedure: REVISION OF ARTERIOVENOUS GORETEX GRAFT RIGHT ARM WITH ARTEGRAFT;  Surgeon: Serafina Mitchell, MD;  Location: [facility];  Service: Vascular;  Laterality: Right;  . SHUNTOGRAM Right 07/14/2019   Procedure: Shuntogram;  Surgeon: Elam Dutch, MD;  Location: [place];  Service: Vascular;  Laterality: Right;  . THROMBECTOMY AND REVISION OF ARTERIOVENTOUS (AV) GORETEX  GRAFT Right 07/14/2019   Procedure: THROMBECTOMY AND REVISION OF ARTERIOVENTOUS (AV) GORETEX  GRAFT;  Surgeon: Elam Dutch, MD;  Location: [place];  Service: Vascular;  Laterality: Right;  . THROMBECTOMY W/ EMBOLECTOMY Left 02/26/2018   Procedure: THROMBECTOMY ARTERIOVENOUS GRAFT;  Surgeon: Angelia Mould, MD;  Location: [place];  Service: Vascular;  Laterality: Left;  . TRANSTHORACIC ECHOCARDIOGRAM  02/2012   [facility]) EF 45-50% with mild global HK.  Mild LVH,LA mod. dilated,mild-mod. 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 AI.  Marland Kitchen TRANSTHORACIC ECHOCARDIOGRAM  9/'18; 3/'19   a) Severely reduced EF.  25 from 30%.  GR 1 DD with diffuse ecchymosis. Mild AS & AI.  Mild LA/RA dilation; b) (post PEA arest):   Moderately dilated LV with moderate concentric virtually.  EF 15 to 20%.  GR 1 DD.  Paradoxical septal motion. Mild AI.  Mild LA dilation.  Poorly visualized RV.  Marland Kitchen TRANSTHORACIC ECHOCARDIOGRAM  02/2019   EF 20-25%.  Unable to assess diastolic function (A. Fib).  No LV apical thrombus.  Mild AI.  Mildly reduced RV function, however poorly visualized.  Marland Kitchen ULTRASOUND GUIDANCE FOR VASCULAR ACCESS  02/20/2018   Procedure: Ultrasound Guidance For Vascular  Access;  Surgeon: Jettie Booze, MD;  Location: [facility] CV LAB;  Service: Cardiovascular;;  . UPPER EXTREMITY VENOGRAPHY N/A 07/08/2018   Procedure: UPPER EXTREMITY VENOGRAPHY;  Surgeon: Waynetta Sandy, MD;  Location: [place] CV LAB;  Service: Cardiovascular;  Laterality: N/A;  Bilateral        Home Medications    Prior to Admission medications   Medication Sig Start Date End Date Taking? Authorizing Provider  allopurinol (ZYLOPRIM) 100 MG tablet 1 tablet by mouth 3x weekly after dialysis. Patient taking differently: Take 100 mg by mouth daily at 2 PM.  06/20/19   [name], NP  amiodarone (PACERONE) 200 MG tablet Take 1 tablet (200 mg total) by mouth daily. 05/12/19   Croitoru, Mihai, MD  amoxicillin (AMOXIL) 500 MG capsule Take QDAILY in the am, then on dialysis days take ANOTHER dose AFTER HD Patient taking differently: Take 500 mg by mouth daily.  04/02/19   Truman Hayward, MD  bictegravir-emtricitabine-tenofovir AF ([name]) 50-200-25 MG TABS tablet Take 1 tablet by mouth daily. 01/01/19   Truman Hayward, MD  brimonidine (ALPHAGAN) 0.15 % ophthalmic solution Place 1 drop into the left eye 2 (two) times daily. 11/15/17   [provider]  dorzolamide-timolol (COSOPT) 22.3-6.8 MG/ML ophthalmic solution Place 1 drop into the left eye 2 (two) times daily. 12/17/17   [provider]  latanoprost (XALATAN) 0.005 % ophthalmic solution Place 1 drop into the left eye at bedtime.     [provider]  midodrine (PROAMATINE) 10 MG tablet Take 1 tablet (10 mg total) by mouth as directed. Twice a day on the day of HD Tuesday, Thursday, Saturday (1 in AM and 1 at [place]). Take 1/2 tablet (5mg ) a day on non HD days Patient taking differently: Take 10 mg by mouth See admin instructions. Take 1 tablet (10 mg) by mouth 3 times daily on Mondays, Tuesdays, Thursdays, & Saturdays. 04/22/19   Leonie Man, MD  multivitamin (RENA-VIT) TABS  tablet Take 1 tablet by mouth daily at 2 PM.     [provider]  warfarin (COUMADIN) 5 MG tablet Take 1/2 to 1 tablet by mouth daily as directed by coumadin clinic 07/25/19   Leonie Man, MD    Family History Family History  Problem Relation Age of Onset  . Hypertension Mother   . Thyroid disease Mother   . Cholelithiasis Daughter   . Cholelithiasis Son   . Hypertension Maternal Grandmother   . Diabetes Maternal Grandmother   . Heart attack Neg Hx   . Hyperlipidemia Neg Hx     Social History Social History   Tobacco Use  . Smoking status: Former Smoker    Packs/day: 1.00    Years: 30.00    Pack years: 30.00    Types: Cigarettes    Quit date: 2006    Years since quitting: 14.7  . Smokeless tobacco: Never Used  Substance Use Topics  . Alcohol use: Yes    Alcohol/week: 1.0 standard drinks    Types: 1 Shots of liquor per week  . Drug use: Never     Allergies   Dextromethorphan-guaifenesin, Tocotrienols, and Losartan potassium   Review of Systems Review of Systems  Respiratory: Positive for shortness of breath.   All other systems reviewed and are negative.    Physical Exam Updated Vital Signs BP (!) 178/128   Pulse (!) 121   Temp 98.2 F (36.8 C) (Oral)   Resp (!) 36   SpO2 94%   Physical Exam Vitals signs and nursing note reviewed.  Constitutional:      Appearance: He is well-developed.  HENT:     Head: Normocephalic and atraumatic.  Eyes:     Conjunctiva/sclera: Conjunctivae normal.     Pupils: Pupils are equal, round, and reactive to light.  Neck:     Musculoskeletal: Normal range of motion.     Comments: JVD noted Cardiovascular:     Rate and Rhythm: Normal rate and regular rhythm.     Heart sounds: Normal heart sounds.  Pulmonary:     Effort: Pulmonary effort is normal. Tachypnea present.  Breath sounds: Rales present. No wheezing or rhonchi.     Comments: Tachypneic, rales throughout Abdominal:     General: Bowel sounds  are normal.     Palpations: Abdomen is soft.  Musculoskeletal: Normal range of motion.     Comments: No significant peripheral edema  Skin:    General: Skin is warm and dry.  Neurological:     Mental Status: He is alert and oriented to person, place, and time.      ED Treatments / Results  Labs (all labs ordered are listed, but only abnormal results are displayed) Labs Reviewed  CBC WITH DIFFERENTIAL/PLATELET - Abnormal; Notable for the following components:      Result Value   RBC 3.19 (*)    Hemoglobin 11.9 (*)    HCT 37.3 (*)    MCV 116.9 (*)    MCH 37.3 (*)    Eosinophils Absolute 1.4 (*)    All other components within normal limits  COMPREHENSIVE METABOLIC PANEL - Abnormal; Notable for the following components:   Potassium 5.3 (*)    Glucose, Bld 128 (*)    BUN 79 (*)    Creatinine, Ser 11.67 (*)    AST 214 (*)    ALT 386 (*)    Alkaline Phosphatase 440 (*)    GFR calc non Af Amer 4 (*)    GFR calc Af Amer 4 (*)    Anion gap 16 (*)    All other components within normal limits  PROTIME-INR - Abnormal; Notable for the following components:   Prothrombin Time 26.1 (*)    INR 2.4 (*)    All other components within normal limits  TROPONIN I (HIGH SENSITIVITY) - Abnormal; Notable for the following components:   Troponin I (High Sensitivity) 75 (*)    All other components within normal limits  SARS CORONAVIRUS 2 (HOSPITAL ORDER, [place] LAB)    EKG None  Radiology Dg Chest Port 1 View  Result Date: 09/02/2019 CLINICAL DATA:  Shortness of breath. Pulmonary edema. EXAM: PORTABLE CHEST 1 VIEW COMPARISON:  Radiograph 07/14/2019 FINDINGS: Right dialysis catheter tip in the SVC. Unchanged cardiomegaly and mediastinal contours. Progression in pulmonary edema from prior exam. Increased bibasilar opacities. No large pleural effusion. No pneumothorax. IMPRESSION: 1. Progression of pulmonary edema from prior exam. 2. Increased bibasilar opacities, may  be vascular or atelectasis. 3. Unchanged cardiomegaly. Electronically Signed   By: Keith Rake M.D.   On: 09/02/2019 03:56    Procedures Procedures (including critical care time)  CRITICAL CARE Performed by: Larene Pickett   Total critical care time: 50 minutes  Critical care time was exclusive of separately billable procedures and treating other patients.  Critical care was necessary to treat or prevent imminent or life-threatening deterioration.  Critical care was time spent personally by me on the following activities: development of treatment plan with patient and/or surrogate as well as nursing, discussions with consultants, evaluation of patient's response to treatment, examination of patient, obtaining history from patient or surrogate, ordering and performing treatments and interventions, ordering and review of laboratory studies, ordering and review of radiographic studies, pulse oximetry and re-evaluation of patient's condition.   Medications Ordered in ED Medications  nitroGLYCERIN (NITROSTAT) SL tablet 0.4 mg (has no administration in time range)  nitroGLYCERIN (NITRODUR - Dosed in mg/24 hr) patch 0.1 mg (0.1 mg Transdermal Patch Applied 09/02/19 0615)  Chlorhexidine Gluconate Cloth 2 % PADS 6 each (has no administration in time range)  pentafluoroprop-tetrafluoroeth (  GEBAUERS) aerosol 1 application (has no administration in time range)  lidocaine (PF) (XYLOCAINE) 1 % injection 5 mL (has no administration in time range)  lidocaine-prilocaine (EMLA) cream 1 application (has no administration in time range)  0.9 %  sodium chloride infusion (has no administration in time range)  0.9 %  sodium chloride infusion (has no administration in time range)  nitroGLYCERIN 50 mg in dextrose 5 % 250 mL (0.2 mg/mL) infusion (33.333 mcg/min Intravenous New Bag/Given 09/02/19 0652)  furosemide (LASIX) injection 40 mg (40 mg Intravenous Given 09/02/19 0348)  furosemide (LASIX) 120 mg in  dextrose 5 % 50 mL IVPB (0 mg Intravenous Stopped 09/02/19 0656)     Initial Impression / Assessment and Plan / ED Course  I have reviewed the triage vital signs and the nursing notes.  Pertinent labs & imaging results that were available during my care of the patient were reviewed by me and considered in my medical decision making (see chart for details).  78 year old male here with shortness of breath.  He recently changed from 4x weekly dialysis to 3x weekly.  Also stopped lasix 2 weeks ago.  He is hypertensive here, tachypneic, and hypoxic on room air.  This improved with 2 L.  Patient's exam is concerning for pulmonary edema as he has JVD and rales throughout.  Plan for IV Lasix, SL NTG, labs, portable chest x-ray.  Patient will require admission so COVID screen has been sent.  4:45 AM Patient's blood pressure greatly improved, now 152/96.  Heart rate has returned to normal.  He is still somewhat tachypneic but much less labored.  His labs are overall reassuring.  Potassium is minimally elevated at 5.3.  Troponin is 75, likely combination of his chronic kidney disease as well as demand ischemia.  He still doing well on 2 L supplemental oxygen.  COVID is negative.  He will need admission to [name] given his ESRD on HD.  5:33 AM Discussed with Dr. Blaine Hamper-- will admit to [facility].  Nephrology consulted as well, spoke with Dr. Hollie Salk who will help arrange dialysis for this morning.  6:32 AM Patient complaining of some worsening shortness of breath.  I have gone to room to reassess and he is now tachycardic into the 140's, diaphoretic, and BP 170's/130's.  RT called immediately to start bipap and will start NTG drip.  We have attempted to talk with dialysis RN at [facility] and we have explained the situation regarding his worsening symptoms, however reports he cannot be accepted to dialysis treamtent until assigned bet at [facility].  Given his worsening status, he needs to go emergently so will transfer ED to ED.  Discussed  with Dr. Leonette Monarch who will accept, Charge RN Gretta Cool also aware.  6:51 AM Tolerating BiPAP at this time, O2 sats 100%.  NTG started.  Have updated nephology, Dr. Hollie Salk-- will hold a chair for him for treatment this AM.  [facility] will be here for transport at 7am.  7:13 AM Change of shift, [facility] should be here soon.  Oncoming team made aware of patient's critical status.  Final Clinical Impressions(s) / ED Diagnoses   Final diagnoses:  Acute pulmonary edema [facility])  Shortness of breath    ED Discharge Orders    None       Larene Pickett, PA-C 09/02/19 0553    Larene Pickett, PA-C 09/02/19 2836    Merryl Hacker, MD 09/02/19 [phone]

## 2019-09-02 NOTE — Progress Notes (Signed)
Patient transported from ED to dialysis, on bipap, without complications.  RT covering dialysis called and informed.  Will continue to monitor.

## 2019-09-02 NOTE — Consult Note (Signed)
Renal Service Consult Note [place] 09/02/2019 Sol Blazing Requesting Physician:  Dr Harvest Forest  Reason for Consult:  ESRD pt w/ resp distress HPI: The patient is a 78 y.o. 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  CXR showed pulm edema. Pt placed on IV ntg and bipap and send to HD unit at [facility] from [facility].  Asked to see for dialysis.   Started HD in 03/2018, was on MTTS 4x/wk and was just changed to TTS 3x/week. Pt states, "I didn't make it".  Main c/o is SOb, no fevers or chills.  Has had mult admits for acute pulm edema w/ resp failure in the past 2 yrs.    Prior admits 2019-20  2/19 - acute pulm edema /w resp failure, CM EF 15%, HIV , CKD 5  4/19 - new esrd, acute pulm edema /w resp failure, CM EF 15%, HIV   1/20 - acute pulm edema /w resp failure, CM EF 15%, HIV  3/20 - acute pulm edema /w resp failure, CM EF 15%, HIV  4/20 - acute pulm edema /w resp failure, CM EF 15%, HIV  4/20 - ischemic colitis, CM EF 20-25%, ESRD on HD  4/20 - acute pulm edema /w resp failure, CM EF 20%, HIV  ROS  denies CP  no joint pain   no HA  no blurry vision  no rash  no diarrhea  no nausea/ vomiting   Past Medical History  Past Medical History:  Diagnosis Date  . 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 Acute on chronic systolic and diastolic heart failure, NYHA class 4 ([place])   . Anemia, iron deficiency 11/15/2011  . Arthritis    "hands, right knee, feet" (02/21/2018)  . Atrial fibrillation ([place])   . Cancer [facility])    hx of prostate; s/p radioactive seed implant 10/2009 Dr Janice Norrie  . Cardiac arrest ([place]) 02/17/2018  . CHF (congestive heart failure) ([place])   . Chronic combined systolic and diastolic heart failure, NYHA class 3 ([place]) 06/2010   felt to be secondary to hypertensive cardiomyopathy  . Chronic lower back pain   . CKD (chronic kidney disease) stage V requiring chronic dialysis ([place])   . ESRD on  dialysis [facility])    started 02/2018 [place]  . GERD (gastroesophageal reflux disease)   . Glaucoma    "I had surgery and dont have it any more"  . Gout    daily RX (02/21/2018)  . Heart murmur    "mild" per pt  . Hepatitis    years ago  . HIV (human immunodeficiency virus infection) ([place])   . HIV infection ([place])   . Hyperlipidemia   . Hypertension    followed by [facility] and Vascular (Dr Dani Gobble Croitoru)  . Hypertension   . Nonischemic cardiomyopathy ([place])   . Pneumonia 11/2017  . Prostate cancer ([place])   . Sinus bradycardia   . Sleep apnea    does not use a cpap  . Stroke [facility])    "mini stroke" years ago   Past Surgical History  Past Surgical History:  Procedure Laterality Date  . AV FISTULA PLACEMENT Right 10/13/2016   Procedure: ARTERIOVENOUS (AV) FISTULA CREATION;  Surgeon: [name], MD;  Location: [facility] OR;  Service: Vascular;  Laterality: Right;  . AV FISTULA PLACEMENT Left 02/25/2018   Procedure: INSERTION OF ARTERIOVENOUS (AV) GORE-TEX GRAFT LEFT UPPER ARM;  Surgeon: Angelia Mould, MD;  Location: [place] OR;  Service: Vascular;  Laterality: Left;  . AV FISTULA PLACEMENT Right 08/07/2018   Procedure: Creation of right arm brachiocephalic Fistula;  Surgeon: Waynetta Sandy, MD;  Location: [place];  Service: Vascular;  Laterality: Right;  . BASCILIC VEIN TRANSPOSITION Left 06/24/2015   Procedure: BASILIC VEIN TRANSPOSITION;  Surgeon: Mal Misty, MD;  Location: [place];  Service: Vascular;  Laterality: Left;  . BIOPSY  03/14/2019   Procedure: BIOPSY;  Surgeon: Irene Shipper, MD;  Location: [place];  Service: Endoscopy;;  . CATARACT EXTRACTION, BILATERAL Bilateral   . COLONOSCOPY WITH PROPOFOL N/A 03/14/2019   Procedure: COLONOSCOPY WITH PROPOFOL;  Surgeon: Irene Shipper, MD;  Location: [facility] ENDOSCOPY;  Service: Endoscopy;  Laterality: N/A;  . ESOPHAGOGASTRODUODENOSCOPY (EGD) WITH PROPOFOL N/A 05/05/2014   Procedure:  ESOPHAGOGASTRODUODENOSCOPY (EGD) WITH PROPOFOL;  Surgeon: Missy Sabins, MD;  Location: [facility] ENDOSCOPY;  Service: Endoscopy;  Laterality: N/A;  . EYE SURGERY Bilateral    cataract surgery   . GLAUCOMA SURGERY Bilateral   . INSERTION OF ARTERIOVENOUS (AV) ARTEGRAFT ARM  10/09/2018   Procedure: INSERTION OF ARTERIOVENOUS (AV) 57mm x 41cm ARTEGRAFT LEFT UPPER ARM;  Surgeon: Waynetta Sandy, MD;  Location: [place];  Service: Vascular;;  . INSERTION OF DIALYSIS CATHETER Right 07/14/2019   Procedure: Insertion Of Dialysis Catheter;  Surgeon: Elam Dutch, MD;  Location: [facility] OR;  Service: Vascular;  Laterality: Right;  placed right Internal Jugular  . INSERTION PROSTATE RADIATION SEED    . IR FLUORO GUIDE CV LINE RIGHT  02/18/2018  . IR REMOVAL TUN CV CATH W/O FL  12/06/2018  . IR US GUIDE VASC ACCESS RIGHT  02/18/2018  . LEFT HEART CATH AND CORONARY ANGIOGRAPHY N/A 02/20/2018   Procedure: LEFT HEART CATH AND CORONARY ANGIOGRAPHY;  Surgeon: Jettie Booze, MD;  Location: [place] CV LAB;;; 50% ostial OM2, 25% mLAD.  Marland Kitchen LEFT HEART CATH AND CORONARY ANGIOGRAPHY  2004   mildly depressed LV systolic fx EF 62%,HUTMLY coronaries/abdominal aorta/renal arteries.  Marland Kitchen NM MYOCAR PERF WALL MOTION  02/21/2010   No ischemia or infarction.  EF 27%.  Marland Kitchen RADIOACTIVE SEED IMPLANT  2010   prostate cancer  . REVISION OF ARTERIOVENOUS GORETEX GRAFT Right 07/11/2019   Procedure: REVISION OF ARTERIOVENOUS GORETEX GRAFT RIGHT ARM WITH ARTEGRAFT;  Surgeon: Serafina Mitchell, MD;  Location: [name];  Service: Vascular;  Laterality: Right;  . SHUNTOGRAM Right 07/14/2019   Procedure: Shuntogram;  Surgeon: Elam Dutch, MD;  Location: [place];  Service: Vascular;  Laterality: Right;  . THROMBECTOMY AND REVISION OF ARTERIOVENTOUS (AV) GORETEX  GRAFT Right 07/14/2019   Procedure: THROMBECTOMY AND REVISION OF ARTERIOVENTOUS (AV) GORETEX  GRAFT;  Surgeon: Elam Dutch, MD;  Location: [place];  Service: Vascular;  Laterality:  Right;  . THROMBECTOMY W/ EMBOLECTOMY Left 02/26/2018   Procedure: THROMBECTOMY ARTERIOVENOUS GRAFT;  Surgeon: Angelia Mould, MD;  Location: [place];  Service: Vascular;  Laterality: Left;  . TRANSTHORACIC ECHOCARDIOGRAM  02/2012   [name]) EF 45-50% with mild global HK.  Mild LVH,LA mod. dilated,mild-mod. 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 AI.  Marland Kitchen TRANSTHORACIC ECHOCARDIOGRAM  9/'18; 3/'19   a) Severely reduced EF.  25 from 30%.  GR 1 DD with diffuse ecchymosis. Mild AS & AI.  Mild LA/RA dilation; b) (post PEA arest):   Moderately dilated LV with moderate concentric virtually.  EF 15 to 20%.  GR 1 DD.  Paradoxical septal motion. Mild AI.  Mild LA dilation.  Poorly visualized RV.  Marland Kitchen TRANSTHORACIC ECHOCARDIOGRAM  02/2019   EF 20-25%.  Unable to assess diastolic function (A. Fib).  No LV apical thrombus.  Mild AI.  Mildly reduced RV function, however poorly visualized.  Marland Kitchen ULTRASOUND GUIDANCE FOR VASCULAR ACCESS  02/20/2018   Procedure: Ultrasound Guidance For Vascular Access;  Surgeon: Jettie Booze, MD;  Location: [place] CV LAB;  Service: Cardiovascular;;  . UPPER EXTREMITY VENOGRAPHY N/A 07/08/2018   Procedure: UPPER EXTREMITY VENOGRAPHY;  Surgeon: Waynetta Sandy, MD;  Location: [place] CV LAB;  Service: Cardiovascular;  Laterality: N/A;  Bilateral   Family History  Family History  Problem Relation Age of Onset  . Hypertension Mother   . Thyroid disease Mother   . Cholelithiasis Daughter   . Cholelithiasis Son   . Hypertension Maternal Grandmother   . Diabetes Maternal Grandmother   . Heart attack Neg Hx   . Hyperlipidemia Neg Hx    Social History  reports that he quit smoking about 14 years ago. His smoking use included cigarettes. He has a 30.00 pack-year smoking history. He has never used smokeless tobacco. He reports current alcohol use of about 1.0 standard drinks of alcohol per week. He reports that he does not use  drugs. Allergies  Allergies  Allergen Reactions  . Dextromethorphan-Guaifenesin     Other reaction(s): Unknown  . Tocotrienols     Other reaction(s): Unknown  . Losartan Potassium Other (See Comments)    Causes constipation  Other reaction(s): Unknown   Home medications Prior to Admission medications   Medication Sig Start Date End Date Taking? Authorizing Provider  acetaminophen (TYLENOL) 500 MG tablet Take 1,000 mg by mouth every 6 (six) hours as needed for moderate pain.   Yes [provider]  allopurinol (ZYLOPRIM) 100 MG tablet 1 tablet by mouth 3x weekly after dialysis. Patient taking differently: Take 100 mg by mouth daily at 2 PM.  06/20/19  Yes [name], NP  amiodarone (PACERONE) 200 MG tablet Take 1 tablet (200 mg total) by mouth daily. 05/12/19  Yes Croitoru, Mihai, MD  amoxicillin (AMOXIL) 500 MG capsule Take QDAILY in the am, then on dialysis days take ANOTHER dose AFTER HD Patient taking differently: Take 500 mg by mouth See admin instructions. 500 mg every day and on dialysis take 1000 mg 04/02/19  Yes Tommy Medal, Lavell Islam, MD  bictegravir-emtricitabine-tenofovir AF ([name]) 50-200-25 MG TABS tablet Take 1 tablet by mouth daily. 01/01/19  Yes Tommy Medal, Lavell Islam, MD  brimonidine (ALPHAGAN) 0.15 % ophthalmic solution Place 1 drop into the left eye 2 (two) times daily. 11/15/17  Yes [provider]  dorzolamide-timolol (COSOPT) 22.3-6.8 MG/ML ophthalmic solution Place 1 drop into the left eye 2 (two) times daily. 12/17/17  Yes [provider]  ferric citrate (AURYXIA) 1 GM 210 MG(Fe) tablet Take 420 mg by mouth 3 (three) times daily with meals.   Yes [provider]  latanoprost (XALATAN) 0.005 % ophthalmic solution Place 1 drop into the left eye at bedtime.    Yes [provider]  loratadine (CLARITIN) 10 MG tablet Take 10 mg by mouth daily.   Yes [provider]  multivitamin (RENA-VIT) TABS tablet Take 1 tablet  by mouth daily at 2 PM.    Yes [provider]  warfarin (COUMADIN) 5 MG tablet Take 1/2 to 1 tablet by mouth daily as directed by coumadin clinic Patient taking differently: Take 2.5-5 mg  by mouth See admin instructions. 2.5 mg on Mondays and fridays. All other days is 5 mg 07/25/19  Yes Leonie Man, MD  midodrine (PROAMATINE) 10 MG tablet Take 1 tablet (10 mg total) by mouth as directed. Twice a day on the day of HD Tuesday, Thursday, Saturday (1 in AM and 1 at [place]). Take 1/2 tablet (5mg ) a day on non HD days Patient taking differently: Take 10 mg by mouth See admin instructions. Take 1 tablet (10 mg) by mouth 3 times daily on Tuesdays, Thursdays, & Saturdays. 04/22/19   Leonie Man, MD   Liver Function Tests Recent Labs  Lab 09/02/19 0347  AST 214*  ALT 386*  ALKPHOS 440*  BILITOT 0.8  PROT 7.8  ALBUMIN 4.1   No results for input(s): LIPASE, AMYLASE in the last 168 hours. CBC Recent Labs  Lab 09/02/19 0347  WBC 10.3  NEUTROABS 6.4  HGB 11.9*  HCT 37.3*  MCV 116.9*  PLT 240   Basic Metabolic Panel Recent Labs  Lab 09/02/19 0347  NA 140  K 5.3*  CL 100  CO2 24  GLUCOSE 128*  BUN 79*  CREATININE 11.67*  CALCIUM 9.5   Iron/TIBC/Ferritin/ %Sat    Component Value Date/Time   IRON 27 (L) 02/22/2018 0752   TIBC 218 (L) 02/22/2018 0752   FERRITIN 1,078 (H) 03/25/2019 0215   IRONPCTSAT 12 (L) 02/22/2018 0752   IRONPCTSAT 32 03/06/2013 1138    Vitals:   09/02/19 1200 09/02/19 1230 09/02/19 1254 09/02/19 1332  BP: (!) 88/61 (!) 85/60 (!) 90/58 (!) 84/64  Pulse: 88 91 91 88  Resp:   (!) 21 (!) 24  Temp:   98.3 F (36.8 C) 99 F (37.2 C)  TempSrc:   Oral Oral  SpO2:   97% 100%  Weight:   89 kg     Exam Gen on bipap, conversant, not in distress No rash, cyanosis or gangrene Sclera anicteric, throat not seen  No jvd or bruits Chest bilat basilar rales, no wheezing RRR no MRG Abd soft ntnd no mass or ascites +bs GU normal male MS no joint  effusions or deformity Ext  Trace-1+ LE edema, no wounds or ulcers Neuro is alert, Ox 3 , nf R IJ TDC    Home meds:  - amiodarone 200 qd/ warfarin 2.5- 5mg  qd  - midodrine 10mg  tid on TTS, ? Taking on non HD days  - bictegravir-emtricitabine-tenofovir qd  - allopurinol 100 / ferric citrate tid ac  - prn's/ vitamins/ supplements/ eyedrops     Outpt HD: [place] [facility]     3.5h   84kg   400/1.5   2/2 bath   P2  Hep none  R IJ TDC   Calcitriol 0.5 ug tiw   No Fe/ESA     Assessment: 1. Pulm edema / acute hypoxemic resp failure - a recurrent problem 2. ESRD - was on HD 4x/wk just changed back to 3x / week. TTS.  3. HIV/ AIDs - counts are good in general 4. CM EF 20-25% 5. Anemia ckd - not on esa at OP unit 6. Hypotension - chronic issue assoc w/ HD mostly   Plan - HD this am upstairs, bipap support   Kelly Splinter  MD 09/02/2019, 1:46 PM

## 2019-09-02 NOTE — Progress Notes (Signed)
Patient is currently off Bipap.  Placed on [place] and is at 1L and sat is 100%.  HR currently at 78.  No distress noted in patient.  Patient currently feels that he does not need the [place], patient has stated that he does not have any SOB at this time.  Going to try patient off [place], but he remains connected to pulse ox to maintain recording of O2 status.  Will continue to monitor.

## 2019-09-02 NOTE — ED Notes (Signed)
Bi-pap applied, nitro drip initiated, unable to obtain another line for IV access, IV furosemide stopped so IV nitro could be started, verbal per PA.

## 2019-09-02 NOTE — H&P (Addendum)
History and Physical    [facility] VOH:[id number] DOB: 1941/07/31 DOA: 09/02/2019  Referring MD/NP/PA: Ivor Costa, MD PCP: [name], NP  Patient coming from:  Via EMS  Chief Complaint: Shortness of breath  I have personally briefly reviewed patient's old medical records in [place]   HPI: [place] is a 78 y.o. 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 fib, CHF EF 20-25%, CAD, HLD, CVA, prostate cancer, anemia of chronic disease, and HIV; who presents with acute shortness of breath starting around 2 AM this morning.  Denies having any significant fever, cough, recent sick contacts, leg swelling, chest pain, nausea, vomiting, or diarrhea symptoms.  He previously had been on a Monday, Tuesday, Thursday, Saturday schedule of hemodialysis up until approximately 2 weeks ago when he was decreased to 3 days a week.  He also was taken off of Lasix around this time as well.  En route with EMS patient was noted to have O2 saturations initially into the 80s placed on room air and was placed on nasal cannula oxygen with improvement.  He was also given 2 nitroglycerin.  Of note patient's AV fistula has not been working and he has a hemodialysis catheter in place temporarily on 8/10 by Dr. Eden Lathe.   ED Course: On admission into the emergency department patient was noted to be afebrile, pulse 101-121, respiration 25-36, blood pressures 130/86 -178/128, and O2 saturations maintained on  BiPAP.  Labs revealed WBC 10.3, hemoglobin 11.9, potassium 5.3, BUN 79, creatinine 11.67, and INR 2.4.  COVID-19 screening negative.CXR showing progression of pulmonary edema and increase bibasilar opacities when compared to x-ray from 8/10.  Nephrology was consulted and plan on dialyzing patient this morning.  Patient was given a total of 160 mg of Lasix IV, sublingual nitroglycerin, and then placed on nitroglycerin drip.  Accepted to a medical telemetry bed for  observation.  Review of Systems  Constitutional: Negative for chills and fever.  HENT: Negative for congestion and ear discharge.   Eyes: Negative for double vision and photophobia.  Respiratory: Positive for shortness of breath. Negative for cough.   Cardiovascular: Negative for chest pain and leg swelling.  Gastrointestinal: Negative for abdominal pain, diarrhea, nausea and vomiting.  Genitourinary: Negative for dysuria and flank pain.  Musculoskeletal: Negative for falls and myalgias.  Neurological: Negative for focal weakness and loss of consciousness.  Psychiatric/Behavioral: Negative for memory loss and substance abuse. The patient has insomnia.     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   Acute on chronic systolic and diastolic heart failure, NYHA class 4 (HCC)    Anemia, iron deficiency 11/15/2011   Arthritis    "hands, right knee, feet" (02/21/2018)   Atrial fibrillation (HCC)    Cancer (HCC)    hx of prostate; s/p radioactive seed implant 10/2009 Dr Janice Norrie   Cardiac arrest [facility]) 02/17/2018   CHF (congestive heart failure) (HCC)    Chronic combined systolic and diastolic heart failure, NYHA class 3 ([place]) 06/2010   felt to be secondary to hypertensive cardiomyopathy   Chronic lower back pain    CKD (chronic kidney disease) stage V requiring chronic dialysis ([place])    ESRD on dialysis ([place])    started 02/2018 [place]   GERD (gastroesophageal reflux disease)    Glaucoma    "I had surgery and dont have it any more"   Gout    daily RX (02/21/2018)   Heart  murmur    "mild" per pt   Hepatitis    years ago   HIV (human immunodeficiency virus infection) ([place])    HIV infection ([place])    Hyperlipidemia    Hypertension    followed by [facility] and Vascular (Dr Dani Gobble Croitoru)   Hypertension    Nonischemic cardiomyopathy ([place])    Pneumonia 11/2017   Prostate cancer ([place])     Sinus bradycardia    Sleep apnea    does not use a cpap   Stroke [facility])    "mini stroke" years ago    Past Surgical History:  Procedure Laterality Date   AV FISTULA PLACEMENT Right 10/13/2016   Procedure: ARTERIOVENOUS (AV) FISTULA CREATION;  Surgeon: [name], MD;  Location: [place];  Service: Vascular;  Laterality: Right;   AV FISTULA PLACEMENT Left 02/25/2018   Procedure: INSERTION OF ARTERIOVENOUS (AV) GORE-TEX GRAFT LEFT UPPER ARM;  Surgeon: Angelia Mould, MD;  Location: [place];  Service: Vascular;  Laterality: Left;   AV FISTULA PLACEMENT Right 08/07/2018   Procedure: Creation of right arm brachiocephalic Fistula;  Surgeon: Waynetta Sandy, MD;  Location: [place];  Service: Vascular;  Laterality: Right;   [place] Left 06/24/2015   Procedure: BASILIC VEIN TRANSPOSITION;  Surgeon: Mal Misty, MD;  Location: [place];  Service: Vascular;  Laterality: Left;   BIOPSY  03/14/2019   Procedure: BIOPSY;  Surgeon: Irene Shipper, MD;  Location: [facility] ENDOSCOPY;  Service: Endoscopy;;   CATARACT EXTRACTION, BILATERAL Bilateral    COLONOSCOPY WITH PROPOFOL N/A 03/14/2019   Procedure: COLONOSCOPY WITH PROPOFOL;  Surgeon: Irene Shipper, MD;  Location: [place];  Service: Endoscopy;  Laterality: N/A;   ESOPHAGOGASTRODUODENOSCOPY (EGD) WITH PROPOFOL N/A 05/05/2014   Procedure: ESOPHAGOGASTRODUODENOSCOPY (EGD) WITH PROPOFOL;  Surgeon: Missy Sabins, MD;  Location: [facility] ENDOSCOPY;  Service: Endoscopy;  Laterality: N/A;   EYE SURGERY Bilateral    cataract surgery    GLAUCOMA SURGERY Bilateral    INSERTION OF ARTERIOVENOUS (AV) ARTEGRAFT ARM  10/09/2018   Procedure: INSERTION OF ARTERIOVENOUS (AV) 27mm x 41cm ARTEGRAFT LEFT UPPER ARM;  Surgeon: Waynetta Sandy, MD;  Location: [place];  Service: Vascular;;   INSERTION OF DIALYSIS CATHETER Right 07/14/2019   Procedure: Insertion Of Dialysis Catheter;  Surgeon: Elam Dutch, MD;  Location: [facility] OR;   Service: Vascular;  Laterality: Right;  placed right Internal Jugular   INSERTION PROSTATE RADIATION SEED     IR FLUORO GUIDE CV LINE RIGHT  02/18/2018   IR REMOVAL TUN CV CATH W/O FL  12/06/2018   IR US GUIDE VASC ACCESS RIGHT  02/18/2018   LEFT HEART CATH AND CORONARY ANGIOGRAPHY N/A 02/20/2018   Procedure: LEFT HEART CATH AND CORONARY ANGIOGRAPHY;  Surgeon: Jettie Booze, MD;  Location: [place] CV LAB;;; 50% ostial OM2, 25% mLAD.   LEFT HEART CATH AND CORONARY ANGIOGRAPHY  2004   mildly depressed LV systolic fx EF 83%,MOQHUT coronaries/abdominal aorta/renal arteries.   NM MYOCAR PERF WALL MOTION  02/21/2010   No ischemia or infarction.  EF 27%.   RADIOACTIVE SEED IMPLANT  2010   prostate cancer   REVISION OF ARTERIOVENOUS GORETEX GRAFT Right 07/11/2019   Procedure: REVISION OF ARTERIOVENOUS GORETEX GRAFT RIGHT ARM WITH ARTEGRAFT;  Surgeon: Serafina Mitchell, MD;  Location: [facility] OR;  Service: Vascular;  Laterality: Right;   SHUNTOGRAM Right 07/14/2019   Procedure: Earney Mallet;  Surgeon: Elam Dutch, MD;  Location: [place];  Service: Vascular;  Laterality: Right;   THROMBECTOMY AND REVISION OF ARTERIOVENTOUS (AV) GORETEX  GRAFT Right 07/14/2019   Procedure: THROMBECTOMY AND REVISION OF ARTERIOVENTOUS (AV) GORETEX  GRAFT;  Surgeon: Elam Dutch, MD;  Location: [place];  Service: Vascular;  Laterality: Right;   THROMBECTOMY W/ EMBOLECTOMY Left 02/26/2018   Procedure: THROMBECTOMY ARTERIOVENOUS GRAFT;  Surgeon: Angelia Mould, MD;  Location: [place];  Service: Vascular;  Laterality: Left;   TRANSTHORACIC ECHOCARDIOGRAM  02/2012   ([place]) EF 45-50% with mild global HK.  Mild LVH,LA mod. dilated,mild-mod. 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 AI.   TRANSTHORACIC ECHOCARDIOGRAM  9/'18; 3/'19   a) Severely reduced EF.  25 from 30%.  GR 1 DD with diffuse ecchymosis. Mild AS & AI.  Mild LA/RA dilation; b) (post PEA arest):   Moderately dilated LV  with moderate concentric virtually.  EF 15 to 20%.  GR 1 DD.  Paradoxical septal motion. Mild AI.  Mild LA dilation.  Poorly visualized RV.   TRANSTHORACIC ECHOCARDIOGRAM  02/2019   EF 20-25%.  Unable to assess diastolic function (A. Fib).  No LV apical thrombus.  Mild AI.  Mildly reduced RV function, however poorly visualized.   ULTRASOUND GUIDANCE FOR VASCULAR ACCESS  02/20/2018   Procedure: Ultrasound Guidance For Vascular Access;  Surgeon: Jettie Booze, MD;  Location: [place] CV LAB;  Service: Cardiovascular;;   UPPER EXTREMITY VENOGRAPHY N/A 07/08/2018   Procedure: UPPER EXTREMITY VENOGRAPHY;  Surgeon: Waynetta Sandy, MD;  Location: [place] CV LAB;  Service: Cardiovascular;  Laterality: N/A;  Bilateral     reports that he quit smoking about 14 years ago. His smoking use included cigarettes. He has a 30.00 pack-year smoking history. He has never used smokeless tobacco. He reports current alcohol use of about 1.0 standard drinks of alcohol per week. He reports that he does not use drugs.  Allergies  Allergen Reactions   Dextromethorphan-Guaifenesin     Other reaction(s): Unknown   Tocotrienols     Other reaction(s): Unknown   Losartan Potassium Other (See Comments)    Causes constipation  Other reaction(s): Unknown    Family History  Problem Relation Age of Onset   Hypertension Mother    Thyroid disease Mother    Cholelithiasis Daughter    Cholelithiasis Son    Hypertension Maternal Grandmother    Diabetes Maternal Grandmother    Heart attack Neg Hx    Hyperlipidemia Neg Hx     Prior to Admission medications   Medication Sig Start Date End Date Taking? Authorizing Provider  acetaminophen (TYLENOL) 500 MG tablet Take 1,000 mg by mouth every 6 (six) hours as needed for moderate pain.   Yes [provider]  allopurinol (ZYLOPRIM) 100 MG tablet 1 tablet by mouth 3x weekly after dialysis. Patient taking differently: Take 100 mg by  mouth daily at 2 PM.  06/20/19  Yes [name], NP  amiodarone (PACERONE) 200 MG tablet Take 1 tablet (200 mg total) by mouth daily. 05/12/19  Yes Croitoru, Mihai, MD  amoxicillin (AMOXIL) 500 MG capsule Take QDAILY in the am, then on dialysis days take ANOTHER dose AFTER HD Patient taking differently: Take 500 mg by mouth See admin instructions. 500 mg every day and on dialysis take 1000 mg 04/02/19  Yes Tommy Medal, Lavell Islam, MD  bictegravir-emtricitabine-tenofovir AF ([name]) 50-200-25 MG TABS tablet Take 1 tablet by mouth daily. 01/01/19  Yes Tommy Medal, Lavell Islam, MD  brimonidine (ALPHAGAN) 0.15 % ophthalmic solution Place  1 drop into the left eye 2 (two) times daily. 11/15/17  Yes [provider]  dorzolamide-timolol (COSOPT) 22.3-6.8 MG/ML ophthalmic solution Place 1 drop into the left eye 2 (two) times daily. 12/17/17  Yes [provider]  ferric citrate (AURYXIA) 1 GM 210 MG(Fe) tablet Take 420 mg by mouth 3 (three) times daily with meals.   Yes [provider]  latanoprost (XALATAN) 0.005 % ophthalmic solution Place 1 drop into the left eye at bedtime.    Yes [provider]  loratadine (CLARITIN) 10 MG tablet Take 10 mg by mouth daily.   Yes [provider]  multivitamin (RENA-VIT) TABS tablet Take 1 tablet by mouth daily at 2 PM.    Yes [provider]  warfarin (COUMADIN) 5 MG tablet Take 1/2 to 1 tablet by mouth daily as directed by coumadin clinic Patient taking differently: Take 2.5-5 mg by mouth See admin instructions. 2.5 mg on Mondays and fridays. All other days is 5 mg 07/25/19  Yes Leonie Man, MD  midodrine (PROAMATINE) 10 MG tablet Take 1 tablet (10 mg total) by mouth as directed. Twice a day on the day of HD Tuesday, Thursday, Saturday (1 in AM and 1 at [place]). Take 1/2 tablet (5mg ) a day on non HD days Patient taking differently: Take 10 mg by mouth See admin instructions. Take 1 tablet (10 mg) by mouth 3 times daily  on Tuesdays, Thursdays, & Saturdays. 04/22/19   Leonie Man, MD    Physical Exam:  Constitutional: Elderly male who appears to be in some mild respiratory discomfort Vitals:   09/02/19 0500 09/02/19 0530 09/02/19 0600 09/02/19 0653  BP: (!) 157/99 (!) 160/99 (!) 178/98 130/86  Pulse: (!) 102 (!) 105 (!) 121 (!) 116  Resp: (!) 26 (!) 26 (!) 35 (!) 33  Temp:      TempSrc:      SpO2: 99% 98% 96% 100%   Eyes: PERRL, lids and conjunctivae normal ENMT: Mucous membranes are moist. Posterior pharynx clear of any exudate or lesions. Poor dentition Neck: normal, supple, no masses, no thyromegaly Respiratory: Positive crackles appreciated.  Mildly tachypneic on BiPAP currently. Cardiovascular: Regular rate and rhythm, no murmurs / rubs / gallops. No extremity edema. 2+ pedal pulses. No carotid bruits.  Right upper extremity fistula without palpable thrill.  Hemodialysis catheter present of the upper right chest wall. Abdomen: no tenderness, no masses palpated. No hepatosplenomegaly. Bowel sounds positive.  Musculoskeletal: Positive clubbing. No joint deformity upper and lower extremities. Good ROM, no contractures. Normal muscle tone.  Skin: no rashes, lesions, ulcers. No induration Neurologic: CN 2-12 grossly intact. Sensation intact, DTR normal. Strength 5/5 in all 4.  Psychiatric: Normal judgment and insight. Alert and oriented x 3. Normal mood.     Labs on Admission: I have personally reviewed following labs and imaging studies  CBC: Recent Labs  Lab 09/02/19 0347  WBC 10.3  NEUTROABS 6.4  HGB 11.9*  HCT 37.3*  MCV 116.9*  PLT 161   Basic Metabolic Panel: Recent Labs  Lab 09/02/19 0347  NA 140  K 5.3*  CL 100  CO2 24  GLUCOSE 128*  BUN 79*  CREATININE 11.67*  CALCIUM 9.5   GFR: CrCl cannot be calculated (Unknown ideal weight.). Liver Function Tests: Recent Labs  Lab 09/02/19 0347  AST 214*  ALT 386*  ALKPHOS 440*  BILITOT 0.8  PROT 7.8  ALBUMIN 4.1   No  results for input(s): LIPASE, AMYLASE in the last 168  hours. No results for input(s): AMMONIA in the last 168 hours. Coagulation Profile: Recent Labs  Lab 09/02/19 0347  INR 2.4*   Cardiac Enzymes: No results for input(s): CKTOTAL, CKMB, CKMBINDEX, TROPONINI in the last 168 hours. BNP (last 3 results) No results for input(s): PROBNP in the last 8760 hours. HbA1C: No results for input(s): HGBA1C in the last 72 hours. CBG: No results for input(s): GLUCAP in the last 168 hours. Lipid Profile: No results for input(s): CHOL, HDL, LDLCALC, TRIG, CHOLHDL, LDLDIRECT in the last 72 hours. Thyroid Function Tests: No results for input(s): TSH, T4TOTAL, FREET4, T3FREE, THYROIDAB in the last 72 hours. Anemia Panel: No results for input(s): VITAMINB12, FOLATE, FERRITIN, TIBC, IRON, RETICCTPCT in the last 72 hours. Urine analysis:    Component Value Date/Time   COLORURINE YELLOW 02/17/2018 0540   APPEARANCEUR CLOUDY (A) 02/17/2018 0540   LABSPEC 1.010 02/17/2018 0540   PHURINE 5.0 02/17/2018 0540   GLUCOSEU NEGATIVE 02/17/2018 0540   HGBUR LARGE (A) 02/17/2018 0540   BILIRUBINUR NEGATIVE 02/17/2018 0540   BILIRUBINUR negative 04/16/2013 1447   KETONESUR NEGATIVE 02/17/2018 0540   PROTEINUR 100 (A) 02/17/2018 0540   UROBILINOGEN 0.2 05/11/2014 1352   NITRITE NEGATIVE 02/17/2018 0540   LEUKOCYTESUR NEGATIVE 02/17/2018 0540   Sepsis Labs: Recent Results (from the past 240 hour(s))  SARS Coronavirus 2 [facility] order, Performed in [facility] hospital lab) Nasopharyngeal Nasopharyngeal Swab     Status: None   Collection Time: 09/02/19  3:47 AM   Specimen: Nasopharyngeal Swab  Result Value Ref Range Status   SARS Coronavirus 2 NEGATIVE NEGATIVE Final    Comment: (NOTE) If result is NEGATIVE SARS-CoV-2 target nucleic acids are NOT DETECTED. The SARS-CoV-2 RNA is generally detectable in upper and lower  respiratory specimens during the acute phase of infection. The lowest  concentration  of SARS-CoV-2 viral copies this assay can detect is 250  copies / mL. A negative result does not preclude SARS-CoV-2 infection  and should not be used as the sole basis for treatment or other  patient management decisions.  A negative result may occur with  improper specimen collection / handling, submission of specimen other  than nasopharyngeal swab, presence of viral mutation(s) within the  areas targeted by this assay, and inadequate number of viral copies  (<250 copies / mL). A negative result must be combined with clinical  observations, patient history, and epidemiological information. If result is POSITIVE SARS-CoV-2 target nucleic acids are DETECTED. The SARS-CoV-2 RNA is generally detectable in upper and lower  respiratory specimens dur ing the acute phase of infection.  Positive  results are indicative of active infection with SARS-CoV-2.  Clinical  correlation with patient history and other diagnostic information is  necessary to determine patient infection status.  Positive results do  not rule out bacterial infection or co-infection with other viruses. If result is PRESUMPTIVE POSTIVE SARS-CoV-2 nucleic acids MAY BE PRESENT.   A presumptive positive result was obtained on the submitted specimen  and confirmed on repeat testing.  While 2019 novel coronavirus  (SARS-CoV-2) nucleic acids may be present in the submitted sample  additional confirmatory testing may be necessary for epidemiological  and / or clinical management purposes  to differentiate between  SARS-CoV-2 and other Sarbecovirus currently known to infect humans.  If clinically indicated additional testing with an alternate test  methodology [phone]) is advised. The SARS-CoV-2 RNA is generally  detectable in upper and lower respiratory sp ecimens during the acute  phase of infection. The  expected result is Negative. Fact Sheet for Patients:  StrictlyIdeas.no Fact Sheet for Healthcare  Providers: BankingDealers.co.za This test is not yet approved or cleared by the [place] FDA and has been authorized for detection and/or diagnosis of SARS-CoV-2 by FDA under an Emergency Use Authorization (EUA).  This EUA will remain in effect (meaning this test can be used) for the duration of the COVID-19 declaration under Section 564(b)(1) of the Act, 21 U.S.C. section 360bbb-3(b)(1), unless the authorization is terminated or revoked sooner. Performed at [facility], [place] [address] [place], [place] [address]      Radiological Exams on Admission: Dg Chest Port 1 View  Result Date: 09/02/2019 CLINICAL DATA:  Shortness of breath. Pulmonary edema. EXAM: PORTABLE CHEST 1 VIEW COMPARISON:  Radiograph 07/14/2019 FINDINGS: Right dialysis catheter tip in the SVC. Unchanged cardiomegaly and mediastinal contours. Progression in pulmonary edema from prior exam. Increased bibasilar opacities. No large pleural effusion. No pneumothorax. IMPRESSION: 1. Progression of pulmonary edema from prior exam. 2. Increased bibasilar opacities, may be vascular or atelectasis. 3. Unchanged cardiomegaly. Electronically Signed   By: Keith Rake M.D.   On: 09/02/2019 03:56    EKG: Independently reviewed.  Sinus tachycardia at 123 bpm QT C5 83.  Assessment/Plan Acute hypoxic respiratory failure secondary to pulmonary edema related with ESRD on HD: Acute.  Patient presents with complaints of shortness of breath after recently being decreased from hemodialysis 4 times a week to 3 times a week.  Chest x-ray showing worsening pulmonary edema.  COVID-19 screening negative.  Nephrology consulted and patient was transferred from [place] [facility] to [facility] for dialysis. -Admit to medical telemetry bed -Nasal cannula oxygen as needed for O2 saturations less than 90% -Fluid restriction  -Appreciate nephrology consultative services, will follow-up for further  recommendations -HD per nephrology -Continue outpatient follow-up with vascular surgery for fistula  Elevated troponin: Acute.  Initial troponin elevated at 75.  EKG without any significant ischemic changes.  Patient denies any chest pain.  Suspect secondary to demand with patient being acutely fluid overloaded. -Follow-up telemetry  Hyperkalemia: Acute.  Potassium mildly elevated at 5.3 on admission.  Patient given Lasix IV. -To be treated with HD  Prolonged QT interval: Acute.  QTc prolonged at 583 on admission -Correct electrolyte abnormalities -Recheck EKG in a.m.  Hypotension: Blood pressures on admission noted to be 122/92-178/128.  Patient takes midodrine on hemodialysis days. -Continue current regimen of midodrine   Dental caries: Patient on prophylactic amoxicillin until all teeth are removed -Continue amoxicillin  COPD: Stable. -Albuterol nebs as needed  Paroxysmal atrial fibrillation on chronic anticoagulation: Patient found to be in sinus tachycardia. INR therapeutic at 2.4. CHA2DS2-VASc score = at least 3. -Continue amiodarone and Coumadin per pharmacy  Nonischemic cardiomyopathy: Last EF 20-25% with diffuse hypokinesis on 03/04/2019. -Daily weights  Anemia of chronic disease: Stable.  Hemoglobin 11.9 on admission which appears near patient's baseline of 10 to 11 g/dL. -Continue to monitor  HIV: Patient on antiretroviral therapy.  Last HIV 1 RNA <20 on 4/15. Follows in outpatient setting with Dr. Drucilla Schmidt. -Continue [name]  History of gout: Patient without acute flare at this time. -Continue allopurinol   DVT prophylaxis: Coumadin Code Status: Full Family Communication: No family present at bedside Disposition Plan: Possible discharge home in 2 to 3 days Consults called: Nephrology Admission status: Inpatient   Norval Morton MD [facility] Pager [phone]   If 7PM-7AM, please contact night-coverage [email or web address] Password [facility]  09/02/2019,  6:59 AM

## 2019-09-02 NOTE — ED Notes (Signed)
Pt placed on 2L [place]

## 2019-09-02 NOTE — ED Triage Notes (Signed)
Pt BIB [facility] from home. Pt called out due to [facility]. Pt is on dialysis 3 days a week (Tuesday, Thursday and Saturday), just moved pt from 4 days a week to 3 days a week. Pt was taken off fluid pill 2 weeks ago. EMS gave pt 2 nitro en-route. Pt denies fever or COVID contact.

## 2019-09-02 NOTE — ED Notes (Signed)
Received pt from [facility]-- on Bipap - transferred from [facility]-ED --resp easy, NTG gtt at 33.3MCG/m.

## 2019-09-02 NOTE — Care Management (Signed)
This is a no charge note  Pending admission per PA, [place],  78 year old man with a past medical history of hypertension, hyperlipidemia, stroke, prostate cancer, HIV, ESRD-HD (TTS), CHF with EF of 20-25%, atrial fibrillation, anemia, who presents with shortness of breath.  Chest x-ray showed pulmonary edema.  Patient was recently off of Lasix.  40 mg of Lasix was given by EDP. I will give another 120 mg of lasix by IV. K 5.3. Bp was 178/128. Pt was given one dose of SL NTG. He feels better. Will give another 0.1mg  of NTG patch now. Pt is placed on tele bed for obs. EDP will consult renal for urgent dialysis.     Ivor Costa, MD  [facility]   If 7PM-7AM, please contact night-coverage [email or web address] Password [name] 09/02/2019, 5:35 AM

## 2019-09-02 NOTE — TOC Initial Note (Addendum)
Transition of Care [facility]) - Initial/Assessment Note    Patient Details  Name: David [name] MRN: [id number] Date of Birth: 09-May-1941  Transition of Care [facility]) CM/SW Contact:    Maryclare Labrador, RN Phone Number: 09/02/2019, 4:23 PM  Clinical Narrative:     PTA independent from home with wife - pt uses cane in the home.  Pt informed CM that he drives himself to outpt HD and appts.  Pt has PCP and denied barriers with paying for prescription medications.  Pt recently reduced from 4 days a week to 3 days a week along with diuretic being cut down before admit - presented with pulm edema.  Pt has had multiple admits - high risk assessment began by CM - most admits are due to acute pulm edema - pt denied missing or completing HD (last admit in April).      CM will continue to follow                     Patient Goals and CMS Choice        Expected Discharge Plan and Services                                                Prior Living Arrangements/Services                       Activities of Daily Living Home Assistive Devices/Equipment: Cane (specify quad or straight), Walker (specify type) ADL Screening (condition at time of admission) Patient's cognitive ability adequate to safely complete daily activities?: Yes Is the patient deaf or have difficulty hearing?: No Does the patient have difficulty seeing, even when wearing glasses/contacts?: No Does the patient have difficulty concentrating, remembering, or making decisions?: No Patient able to express need for assistance with ADLs?: Yes Does the patient have difficulty dressing or bathing?: No Independently performs ADLs?: Yes (appropriate for developmental age) Does the patient have difficulty walking or climbing stairs?: No Weakness of Legs: None Weakness of Arms/Hands: None  Permission Sought/Granted                  Emotional Assessment              Admission diagnosis:   Shortness of breath [R06.02] Acute pulmonary edema ([place]) [J81.0] Pulmonary edema [J81.1] Patient Active Problem List   Diagnosis Date Noted  . Hyperkalemia 09/02/2019  . Unspecified open wound of right upper arm, initial encounter 06/21/2019  . Shortness of breath 04/12/2019  . 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 Dependence on renal dialysis ([place]) 03/27/2019  . Hypertensive heart and chronic kidney disease with heart failure and with stage 5 chronic kidney disease, or end stage renal disease ([place]) 03/27/2019  . Mesenteric ischemia ([place])   . Colonic ulcer   . Right lower quadrant pain   . Abnormal CT of the abdomen   . Ischemic colitis ([place])   . Colitis presumed infectious 03/09/2019  . Acute respiratory failure with hypoxia and hypercapnia ([place]) 03/04/2019  . Pruritus, unspecified 02/21/2019  . Acute pulmonary edema ([place]) 02/11/2019  . Pain, unspecified 01/21/2019  . Atrial fibrillation with RVR ([place]) 12/05/2018  . Paroxysmal atrial fibrillation ([place]) 11/16/2018  . LBBB (left bundle branch block) 11/16/2018  . Encounter for monitoring amiodarone therapy 11/16/2018  . Volume overload 11/01/2018  . Pulmonary edema  10/21/2018  . Gout 10/21/2018  . Hypertensive emergency 10/21/2018  . Chronic combined systolic and diastolic heart failure, NYHA class 3 ([place]) 10/21/2018  . Leukocytosis 10/21/2018  . Chills (without fever) 09/24/2018  . Gastroesophageal reflux disease 08/28/2018  . Chronic anticoagulation 04/03/2018  . CAD (coronary artery disease) 04/03/2018  . Palliative care encounter 03/27/2018  . Atrial fibrillation, chronic 03/25/2018  . Anemia in chronic kidney disease 03/12/2018  . Coagulation defect, unspecified ([place]) 03/12/2018  . Secondary hyperparathyroidism of renal origin ([place]) 03/12/2018  . Palliative care by specialist   . Advance care planning   . Goals of care, counseling/discussion   . Respiratory arrest ([place])   . Acute on chronic systolic heart failure ([place])    . Chronic obstructive pulmonary disease ([place])   . HIV (human immunodeficiency virus infection) ([place])   . ESRD (end stage renal disease) ([place])   . Acute respiratory failure with hypoxia ([place]) 01/21/2018  . Pneumonia   . Aspiration pneumonia of both lower lobes due to regurgitated food ([place])   . Sepsis due to pneumonia ([place]) 11/22/2017  . AIDS ([place]) 05/14/2014  . 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 Gastroparesis 05/06/2014  . Protein-calorie malnutrition, severe ([place]) 05/05/2014  . ESRD on hemodialysis ([place]) 05/02/2014  . Hemodialysis-associated hypotension 05/02/2014  . Leaking of conjunctival drainage bleb 05/01/2014  . POAG (primary open-angle glaucoma) 05/01/2014  . Loss of weight 04/29/2014  . Conjunctivitis 04/29/2014  . 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 Low back pain 04/09/2012  . Osteoarthritis of hip 12/29/2011  . Iron deficiency anemia 06/28/2011  . Macrocytic anemia 04/02/2011  . 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 Hyperlipidemia 11/30/2010  . Transient cerebral ischemia 11/30/2010  . HYPERGLYCEMIA 11/30/2010   PCP:  [name], NP Pharmacy:   [facility] [place], [address] [place] [address] Phone: [phone] Fax: [phone]  [facility] [place], [address] [place] [address] Phone: [phone] Fax: [phone]     Social Determinants of Health (SDOH) Interventions    Readmission Risk Interventions Readmission Risk Prevention Plan 09/02/2019  Transportation Screening Complete  Medication Review (RN Care Manager) Complete  Palliative Care Screening Not Applicable  Some recent data might be hidden

## 2019-09-02 NOTE — Procedures (Signed)
   I was present at this dialysis session, have reviewed the session itself and made  appropriate changes Kelly Splinter MD [place] pager [phone]   09/02/2019, 2:06 PM

## 2019-09-02 NOTE — Progress Notes (Signed)
[place] for warfarin Indication: hx atrial fibrillation  Allergies  Allergen Reactions  . Dextromethorphan-Guaifenesin     Other reaction(s): Unknown  . Tocotrienols     Other reaction(s): Unknown  . Losartan Potassium Other (See Comments)    Causes constipation  Other reaction(s): Unknown    Patient Measurements:   Heparin Dosing Weight:   Vital Signs: Temp: 98.2 F (36.8 C) (09/29 0326) Temp Source: Oral (09/29 0326) BP: 122/92 (09/29 0715) Pulse Rate: 108 (09/29 0715)  Labs: Recent Labs    09/02/19 0347  HGB 11.9*  HCT 37.3*  PLT 259  LABPROT 26.1*  INR 2.4*  CREATININE 11.67*  TROPONINIHS 75*    CrCl cannot be calculated (Unknown ideal weight.).   Medications:  PTA warfarin regimen: 5 mg daily except 2.5 mg on Mon and Fri  Assessment: Patient's a 78 y.o M with ESRD on HD and chronic afib on warfarin PTA presented to the ED with c/o SOB. Patient's HD sessions were recently changed from 4x a week to 3. Plan is to transfer patient to [facility] for HD. Warfarin resumed on admission.  Today, 09/02/2019: - INR is therapeutic at 2.4 - AST/ALT elevated- monitor closely, Tbili wnl - cbc ok - on home amiodarone  Goal of Therapy:  INR 2-3 Monitor platelets by anticoagulation protocol: Yes   Plan:  - warfarin 5 mg PO x1 today (home dose) - daily INR - monitor for s/s bleeding  David [name] P 09/02/2019,7:48 AM

## 2019-09-02 NOTE — ED Notes (Signed)
Report given to [facility] by [place], [place]

## 2019-09-02 NOTE — ED Notes (Addendum)
Called dialysis nurse at [phone], they were unable to accept him because he does not has an assigned bed.

## 2019-09-02 NOTE — Progress Notes (Signed)
Patient arrived via [facility] on bipap from [facility] ED.  Patient placed on our bipap machine and is currently tolerating well.  Will continue to monitor.

## 2019-09-02 NOTE — ED Notes (Signed)
[facility] called for transport 

## 2019-09-02 NOTE — Progress Notes (Signed)
Patient transported to [facility] with [facility] on Bi[facility]. Report called to [facility] ED RT. Patient stable during this time.

## 2019-09-03 ENCOUNTER — Other Ambulatory Visit: Payer: Self-pay

## 2019-09-03 DIAGNOSIS — N186 End stage renal disease: Secondary | ICD-10-CM | POA: Diagnosis not present

## 2019-09-03 DIAGNOSIS — Z992 Dependence on renal dialysis: Secondary | ICD-10-CM | POA: Diagnosis not present

## 2019-09-03 DIAGNOSIS — S37009A Unspecified injury of unspecified kidney, initial encounter: Secondary | ICD-10-CM | POA: Diagnosis not present

## 2019-09-03 LAB — RENAL FUNCTION PANEL
Albumin: 3.4 g/dL — ABNORMAL LOW (ref 3.5–5.0)
Anion gap: 17 — ABNORMAL HIGH (ref 5–15)
BUN: 38 mg/dL — ABNORMAL HIGH (ref 8–23)
CO2: 22 mmol/L (ref 22–32)
Calcium: 9 mg/dL (ref 8.9–10.3)
Chloride: 98 mmol/L (ref 98–111)
Creatinine, Ser: 7.94 mg/dL — ABNORMAL HIGH (ref 0.61–1.24)
GFR calc Af Amer: 7 mL/min — ABNORMAL LOW (ref >60)
GFR calc non Af Amer: 6 mL/min — ABNORMAL LOW (ref >60)
Glucose, Bld: 86 mg/dL (ref 70–99)
Phosphorus: 4.4 mg/dL (ref 2.5–4.6)
Potassium: 4.8 mmol/L (ref 3.5–5.1)
Sodium: 137 mmol/L (ref 135–145)

## 2019-09-03 LAB — CBC
HCT: 35.1 % — ABNORMAL LOW (ref 39.0–52.0)
Hemoglobin: 11.3 g/dL — ABNORMAL LOW (ref 13.0–17.0)
MCH: 36.9 pg — ABNORMAL HIGH (ref 26.0–34.0)
MCHC: 32.2 g/dL (ref 30.0–36.0)
MCV: 114.7 fL — ABNORMAL HIGH (ref 80.0–100.0)
Platelets: 255 10*3/uL (ref 150–400)
RBC: 3.06 MIL/uL — ABNORMAL LOW (ref 4.22–5.81)
RDW: 15.2 % (ref 11.5–15.5)
WBC: 7.2 10*3/uL (ref 4.0–10.5)
nRBC: 0 % (ref 0.0–0.2)

## 2019-09-03 LAB — PROTIME-INR
INR: 2 — ABNORMAL HIGH (ref 0.8–1.2)
Prothrombin Time: 22.6 seconds — ABNORMAL HIGH (ref 11.4–15.2)

## 2019-09-03 MED ORDER — WARFARIN SODIUM 5 MG PO TABS: 5.0000 mg | ORAL_TABLET | Freq: Once | ORAL | Status: DC

## 2019-09-03 NOTE — Consult Note (Signed)
   [facility] CM Inpatient Consult   09/03/2019  [facility] [place] 03-01-1941 [id number]   Patient screened for extreme high risk score for unplanned readmission score of 47%  And with 4 admissions in the past 6 months of hospitalizations noted.   Review of patient's medical record reveals patient is admitted with acute pulmonary edema and chronic renal disease with HD, T,TH,SA schedule.  Primary Care Provider is  Judi Saa, NP, [place] [facility] this office does the TOC follow up.  Pharmacy is:  Estate manager/land agent to provider: Family  Plan: Called patient's hospital room but no answer. Spoke with patient via cell phone. Patient agrees to post hospital follow up for chronic disease management.  For questions contact:   Natividad Brood, RN BSN [facility] Liaison  [phone] business mobile phone Toll free office [phone]  Fax number: [phone] [email or web address]

## 2019-09-03 NOTE — Progress Notes (Signed)
[place] for warfarin Indication: hx atrial fibrillation  Allergies  Allergen Reactions  . Dextromethorphan-Guaifenesin     Other reaction(s): Unknown  . Tocotrienols     Other reaction(s): Unknown  . Losartan Potassium Other (See Comments)    Causes constipation  Other reaction(s): Unknown    Patient Measurements: Height: 6\' 2"  (188 cm) Weight: 196 lb 3.4 oz (89 kg) IBW/kg (Calculated) : 82.2 Heparin Dosing Weight:   Vital Signs: Temp: 97.8 F (36.6 C) (09/30 0742) BP: 123/74 (09/30 0742) Pulse Rate: 66 (09/30 0742)  Labs: Recent Labs    09/02/19 0347 09/03/19 0450  HGB 11.9* 11.3*  HCT 37.3* 35.1*  PLT 259 255  LABPROT 26.1* 22.6*  INR 2.4* 2.0*  CREATININE 11.67* 7.94*  TROPONINIHS 75*  --     Estimated Creatinine Clearance: 9.1 mL/min (A) (by C-G formula based on SCr of 7.94 mg/dL (H)).   Medications:  PTA warfarin regimen: 5 mg daily except 2.5 mg on Mon and Fri  Assessment: Patient's a 78 y.o M with ESRD on HD and chronic afib on warfarin PTA presented to the ED with c/o SOB. Patient's HD sessions were recently changed from 4x a week to 3. Warfarin resumed on admission.  Today, 09/03/2019: - INR is therapeutic at 2.0 (down) - AST/ALT elevated - monitor closely, Tbili wnl - CBC stable - on home amiodarone  Goal of Therapy:  INR 2-3 Monitor platelets by anticoagulation protocol: Yes   Plan:  - Warfarin 5 mg PO x 1 again today (home dose) - Monitor daily INR, CBC, s/sx bleeding   Elicia Lamp, PharmD, BCPS Please check AMION for all [place] contact numbers Clinical Pharmacist 09/03/2019 9:35 AM

## 2019-09-03 NOTE — Discharge Instructions (Signed)
Chronic Kidney Disease, Adult °Chronic kidney disease (CKD) happens when the kidneys are damaged over a long period of time. The kidneys are two organs that help with: °· Getting rid of waste and extra fluid from the blood. °· Making hormones that maintain the amount of fluid in your tissues and blood vessels. °· Making sure that the body has the right amount of fluids and chemicals. °Most of the time, CKD does not go away, but it can usually be controlled. Steps must be taken to slow down the kidney damage or to stop it from getting worse. If this is not done, the kidneys may stop working. °Follow these instructions at home: °Medicines °· Take over-the-counter and prescription medicines only as told by your doctor. You may need to change the amount of medicines you take. °· Do not take any new medicines unless your doctor says it is okay. Many medicines can make your kidney damage worse. °· Do not take any vitamin and supplements unless your doctor says it is okay. Many vitamins and supplements can make your kidney damage worse. °General instructions °· Follow a diet as told by your doctor. You may need to stay away from: °? Alcohol. °? Salty foods. °? Foods that are high in: °§ Potassium. °§ Calcium. °§ Protein. °· Do not use any products that contain nicotine or tobacco, such as cigarettes and e-cigarettes. If you need help quitting, ask your doctor. °· Keep track of your blood pressure at home. Tell your doctor about any changes. °· If you have diabetes, keep track of your blood sugar as told by your doctor. °· Try to stay at a healthy weight. If you need help, ask your doctor. °· Exercise at least 30 minutes a day, 5 days a week. °· Stay up-to-date with your shots (immunizations) as told by your doctor. °· Keep all follow-up visits as told by your doctor. This is important. °Contact a doctor if: °· Your symptoms get worse. °· You have new symptoms. °Get help right away if: °· You have symptoms of end-stage  kidney disease. These may include: °? Headaches. °? Numbness in your hands or feet. °? Easy bruising. °? Having hiccups often. °? Chest pain. °? Shortness of breath. °? Stopping of menstrual periods in women. °· You have a fever. °· You have very little pee (urine). °· You have pain or bleeding when you pee. °Summary °· Chronic kidney disease (CKD) happens when the kidneys are damaged over a long period of time. °· Most of the time, this condition does not go away, but it can usually be controlled. Steps must be taken to slow down the kidney damage or to stop it from getting worse. °· Treatment may include a combination of medicines and lifestyle changes. °This information is not intended to replace advice given to you by your health care provider. Make sure you discuss any questions you have with your health care provider. °Document Released: 02/14/2010 Document Revised: 11/02/2017 Document Reviewed: 12/25/2016 °Elsevier Patient Education © 2020 [employer] Inc. ° °

## 2019-09-03 NOTE — Progress Notes (Signed)
[facility] Progress Note  Subjective: feels much better. Weighed standing up at 83 kg (84 dry wt).   Vitals:   09/02/19 1254 09/02/19 1332 09/02/19 1611 09/03/19 0742  BP: (!) 90/58 (!) 84/64 100/64 123/74  Pulse: 91 88 82 66  Resp: (!) 21 (!) 24 (!) 24 19  Temp: 98.3 F (36.8 C) 99 F (37.2 C) 98.5 F (36.9 C) 97.8 F (36.6 C)  TempSrc: Oral Oral Oral   SpO2: 97% 100% 100% 98%  Weight: 89 kg     Height:   6\' 2"  (1.88 m)     Inpatient medications: . allopurinol  100 mg Oral Daily  . amiodarone  200 mg Oral Daily  . [START ON 09/04/2019] amoxicillin  1,000 mg Oral Once per day on Tue Thu Sat  . amoxicillin  500 mg Oral Once per day on Sun Mon Wed Fri  . bictegravir-emtricitabine-tenofovir AF  1 tablet Oral Daily  . brimonidine  1 drop Left Eye BID  . Chlorhexidine Gluconate Cloth  6 each Topical Q0600  . dorzolamide-timolol  1 drop Left Eye BID  . ferric citrate  420 mg Oral TID WC  . latanoprost  1 drop Left Eye QHS  . midodrine  10 mg Oral 3 times per day on Tue Thu Sat  . multivitamin  1 tablet Oral QHS  . sodium chloride flush  3 mL Intravenous Q12H  . warfarin  5 mg Oral ONCE-1800  . Warfarin - Pharmacist Dosing Inpatient   Does not apply q1800   . sodium chloride    . sodium chloride     sodium chloride, sodium chloride, acetaminophen **OR** acetaminophen, albuterol, alteplase, heparin, lidocaine (PF), lidocaine-prilocaine, nitroGLYCERIN, pentafluoroprop-tetrafluoroeth    Exam: Gen on bipap, conversant, not in distress No rash, cyanosis or gangrene Sclera anicteric, throat not seen  No jvd or bruits Chest bilat basilar rales, no wheezing RRR no MRG Abd soft ntnd no mass or ascites +bs GU normal male MS no joint effusions or deformity Ext  Trace-1+ LE edema, no wounds or ulcers Neuro is alert, Ox 3 , nf R IJ TDC    Home meds:  - amiodarone 200 qd/ warfarin 2.5- 5mg  qd  - midodrine 10mg  tid on TTS, ? Taking on non HD days  -  bictegravir-emtricitabine-tenofovir qd  - allopurinol 100 / ferric citrate tid ac  - prn's/ vitamins/ supplements/ eyedrops     Outpt HD: [place] [facility]     3.5h   84kg   400/1.5   2/2 bath   P2  Hep none  R IJ TDC   Calcitriol 0.5 ug tiw   No Fe/ESA     Assessment: 1. Pulm edema / acute hypoxemic resp failure - resolved after HD yesterday. OK for DC home today.  2. ESRD - was on HD 4x/wk just changed back to 3x / week. Pt asking to change back to 4x/wk.  Needs lower dry wt as well, will lower gradually from 83kg to 80- 81kg over the next 1-2 wks at OP HD.   3. HIV/ AIDs - counts are good in general 4. CM EF 20-25% 5. Anemia ckd - not on esa at OP unit 6. Hypotension - chronic issue assoc w/ HD mostly      Rob Miesha Bachmann 09/03/2019, 11:34 AM  Iron/TIBC/Ferritin/ %Sat    Component Value Date/Time   IRON 27 (L) 02/22/2018 0752   TIBC 218 (L) 02/22/2018 0752   FERRITIN 1,078 (H) 03/25/2019 0215   IRONPCTSAT 12 (  L) 02/22/2018 0752   IRONPCTSAT 32 03/06/2013 1138   Recent Labs  Lab 09/03/19 0450  NA 137  K 4.8  CL 98  CO2 22  GLUCOSE 86  BUN 38*  CREATININE 7.94*  CALCIUM 9.0  PHOS 4.4  ALBUMIN 3.4*  INR 2.0*   Recent Labs  Lab 09/02/19 0347  AST 214*  ALT 386*  ALKPHOS 440*  BILITOT 0.8  PROT 7.8   Recent Labs  Lab 09/03/19 0450  WBC 7.2  HGB 11.3*  HCT 35.1*  PLT 255

## 2019-09-03 NOTE — Discharge Summary (Signed)
Physician Discharge Summary  [facility] HBZ:[id number] DOB: [birth year] DOA: 09/02/2019  PCP: [name], NP  Admit date: 09/02/2019 Discharge date: 09/03/2019  Admitted From: Home Disposition: Home  Recommendations for Outpatient Follow-up:  1. Follow up with PCP in 1-2 weeks 2. Follow-up with patient dialysis 3. Please obtain BMP/CBC in one week 4. Please follow up on the following pending results:  Home Health: None Equipment/Devices: None  Discharge Condition: Stable CODE STATUS: Full code Diet recommendation: Cardiac/renal  Subjective: Seen and examined.  Has no complaint.  Denied any chest pain or shortness of breath.  Wants to go home.  Brief/Interim Summary: David [name] is a 78 y.o. 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 fib, CHF EF 20-25%, CAD, HLD, CVA, prostate cancer, anemia of chronic disease, and HIV; who presented to ED with acute shortness of breath starting around 2 AM this morning.  Denied having any significant fever, cough, recent sick contacts, leg swelling, chest pain, nausea, vomiting, or diarrhea symptoms.  He previously had been on a Monday, Tuesday, Thursday, Saturday schedule of hemodialysis up until approximately 2 weeks ago when he was decreased to 3 days a week.  He also was taken off of Lasix around this time as well.  En route with EMS patient was noted to have O2 saturations initially into the 80s placed on room air and was placed on nasal cannula oxygen with improvement.  He was also given 2 nitroglycerin.  Of note patient's AV fistula has not been working and he has a hemodialysis catheter in place temporarily on 8/10 by Dr. Eden Lathe.  Upon arrival to ED,  patient was afebrile, pulse 101-121, respiration 25-36, blood pressures 130/86 -178/128, and O2 saturations maintained on  BiPAP.  Labs revealed WBC 10.3, hemoglobin 11.9, potassium 5.3, BUN 79, creatinine 11.67, and INR 2.4.  COVID-19 screening  negative.CXR showing progression of pulmonary edema and increase bibasilar opacities when compared to x-ray from 8/10.  Patient was admitted under [employer] and Nephrology was consulted and he underwent dialysis.  Patient was given a total of 160 mg of Lasix IV, sublingual nitroglycerin, and then placed on nitroglycerin drip.   Of note, he did have 1 set of troponin done which was only 75, minimally elevated, likely secondary to demand ischemia.  Currently he has no chest pain or shortness of breath and feels much better.  He is not hypoxic.  He has been cleared by nephrology to be discharged and since he is hemodynamically stable so he is going to be discharged back and will resume his outpatient hemodialysis.  Discharge Diagnoses:  Principal Problem:   Acute respiratory failure with hypoxia ([place]) Active Problems:   ESRD on hemodialysis (HCC)   HIV (human immunodeficiency virus infection) ([place])   Chronic anticoagulation   Pulmonary edema   Paroxysmal atrial fibrillation (HCC)   Hyperkalemia    Discharge Instructions  Discharge Instructions    Discharge patient   Complete by: As directed    Discharge disposition: 01-Home or Self Care   Discharge patient date: 09/03/2019     Allergies as of 09/03/2019      Reactions   Dextromethorphan-guaifenesin    Other reaction(s): Unknown   Tocotrienols    Other reaction(s): Unknown   Losartan Potassium Other (See Comments)   Causes constipation Other reaction(s): Unknown      Medication List    TAKE these medications   acetaminophen 500 MG tablet Commonly known as: TYLENOL Take 1,000 mg by mouth every 6 (  six) hours as needed for moderate pain.   allopurinol 100 MG tablet Commonly known as: ZYLOPRIM 1 tablet by mouth 3x weekly after dialysis. What changed:   how much to take  how to take this  when to take this  additional instructions   amiodarone 200 MG tablet Commonly known as: PACERONE Take 1 tablet (200 mg total) by mouth  daily.   amoxicillin 500 MG capsule Commonly known as: AMOXIL Take QDAILY in the am, then on dialysis days take ANOTHER dose AFTER HD What changed:   how much to take  how to take this  when to take this  additional instructions   Auryxia 1 GM 210 MG(Fe) tablet Generic drug: ferric citrate Take 420 mg by mouth 3 (three) times daily with meals.   bictegravir-emtricitabine-tenofovir AF 50-200-25 MG Tabs tablet Commonly known as: [name] Take 1 tablet by mouth daily.   brimonidine 0.15 % ophthalmic solution Commonly known as: ALPHAGAN Place 1 drop into the left eye 2 (two) times daily.   dorzolamide-timolol 22.3-6.8 MG/ML ophthalmic solution Commonly known as: COSOPT Place 1 drop into the left eye 2 (two) times daily.   latanoprost 0.005 % ophthalmic solution Commonly known as: XALATAN Place 1 drop into the left eye at bedtime.   loratadine 10 MG tablet Commonly known as: CLARITIN Take 10 mg by mouth daily.   midodrine 10 MG tablet Commonly known as: PROAMATINE Take 1 tablet (10 mg total) by mouth as directed. Twice a day on the day of HD Tuesday, Thursday, Saturday (1 in AM and 1 at [place]). Take 1/2 tablet (5mg ) a day on non HD days What changed:   when to take this  additional instructions   multivitamin Tabs tablet Take 1 tablet by mouth daily at 2 PM.   warfarin 5 MG tablet Commonly known as: COUMADIN Take as directed. If you are unsure how to take this medication, talk to your nurse or doctor. Original instructions: Take 1/2 to 1 tablet by mouth daily as directed by coumadin clinic What changed:   how much to take  how to take this  when to take this  additional instructions      Follow-up Information    [name], NP Follow up in 1 week(s).   Specialty: Internal Medicine Contact information: [place] [address] [phone]        Tommy Medal, Lavell Islam, MD .   Specialty: Infectious  Diseases Contact information: [address] [place] [address] [phone]        [name], MD .   Specialty: Cardiology Contact information: [address] [place] [address] [phone]          Allergies  Allergen Reactions  . Dextromethorphan-Guaifenesin     Other reaction(s): Unknown  . Tocotrienols     Other reaction(s): Unknown  . Losartan Potassium Other (See Comments)    Causes constipation  Other reaction(s): Unknown    Consultations: Nephrology   Procedures/Studies: Dg Chest Port 1 View  Result Date: 09/02/2019 CLINICAL DATA:  Shortness of breath. Pulmonary edema. EXAM: PORTABLE CHEST 1 VIEW COMPARISON:  Radiograph 07/14/2019 FINDINGS: Right dialysis catheter tip in the SVC. Unchanged cardiomegaly and mediastinal contours. Progression in pulmonary edema from prior exam. Increased bibasilar opacities. No large pleural effusion. No pneumothorax. IMPRESSION: 1. Progression of pulmonary edema from prior exam. 2. Increased bibasilar opacities, may be vascular or atelectasis. 3. Unchanged cardiomegaly. Electronically Signed   By: Threasa Beards  Sanford M.D.   On: 09/02/2019 03:56   Dg Foot Complete Right  Result Date: 08/29/2019 Please see detailed radiograph report in office note.     Discharge Exam: Vitals:   09/02/19 1611 09/03/19 0742  BP: 100/64 123/74  Pulse: 82 66  Resp: (!) 24 19  Temp: 98.5 F (36.9 C) 97.8 F (36.6 C)  SpO2: 100% 98%   Vitals:   09/02/19 1254 09/02/19 1332 09/02/19 1611 09/03/19 0742  BP: (!) 90/58 (!) 84/64 100/64 123/74  Pulse: 91 88 82 66  Resp: (!) 21 (!) 24 (!) 24 19  Temp: 98.3 F (36.8 C) 99 F (37.2 C) 98.5 F (36.9 C) 97.8 F (36.6 C)  TempSrc: Oral Oral Oral   SpO2: 97% 100% 100% 98%  Weight: 89 kg     Height:   6\' 2"  (1.88 m)     General: Pt is alert, awake, not in acute distress Cardiovascular: RRR, S1/S2 +, no rubs, no gallops Respiratory: CTA bilaterally, no  wheezing, no rhonchi Abdominal: Soft, NT, ND, bowel sounds + Extremities: no edema, no cyanosis    The results of significant diagnostics from this hospitalization (including imaging, microbiology, ancillary and laboratory) are listed below for reference.     Microbiology: Recent Results (from the past 240 hour(s))  SARS Coronavirus 2 [facility] order, Performed in [facility] hospital lab) Nasopharyngeal Nasopharyngeal Swab     Status: None   Collection Time: 09/02/19  3:47 AM   Specimen: Nasopharyngeal Swab  Result Value Ref Range Status   SARS Coronavirus 2 NEGATIVE NEGATIVE Final    Comment: (NOTE) If result is NEGATIVE SARS-CoV-2 target nucleic acids are NOT DETECTED. The SARS-CoV-2 RNA is generally detectable in upper and lower  respiratory specimens during the acute phase of infection. The lowest  concentration of SARS-CoV-2 viral copies this assay can detect is 250  copies / mL. A negative result does not preclude SARS-CoV-2 infection  and should not be used as the sole basis for treatment or other  patient management decisions.  A negative result may occur with  improper specimen collection / handling, submission of specimen other  than nasopharyngeal swab, presence of viral mutation(s) within the  areas targeted by this assay, and inadequate number of viral copies  (<250 copies / mL). A negative result must be combined with clinical  observations, patient history, and epidemiological information. If result is POSITIVE SARS-CoV-2 target nucleic acids are DETECTED. The SARS-CoV-2 RNA is generally detectable in upper and lower  respiratory specimens dur ing the acute phase of infection.  Positive  results are indicative of active infection with SARS-CoV-2.  Clinical  correlation with patient history and other diagnostic information is  necessary to determine patient infection status.  Positive results do  not rule out bacterial infection or co-infection with other  viruses. If result is PRESUMPTIVE POSTIVE SARS-CoV-2 nucleic acids MAY BE PRESENT.   A presumptive positive result was obtained on the submitted specimen  and confirmed on repeat testing.  While 2019 novel coronavirus  (SARS-CoV-2) nucleic acids may be present in the submitted sample  additional confirmatory testing may be necessary for epidemiological  and / or clinical management purposes  to differentiate between  SARS-CoV-2 and other Sarbecovirus currently known to infect humans.  If clinically indicated additional testing with an alternate test  methodology [phone]) is advised. The SARS-CoV-2 RNA is generally  detectable in upper and lower respiratory sp ecimens during the acute  phase of infection. The expected result is Negative. Fact  Sheet for Patients:  StrictlyIdeas.no Fact Sheet for Healthcare Providers: BankingDealers.co.za This test is not yet approved or cleared by the [place] FDA and has been authorized for detection and/or diagnosis of SARS-CoV-2 by FDA under an Emergency Use Authorization (EUA).  This EUA will remain in effect (meaning this test can be used) for the duration of the COVID-19 declaration under Section 564(b)(1) of the Act, 21 U.S.C. section 360bbb-3(b)(1), unless the authorization is terminated or revoked sooner. Performed at [facility], [place] [address], [place], [place] [address]      Labs: BNP (last 3 results) Recent Labs    12/04/18 2207 02/11/19 0015 03/04/19 0519  BNP 1,630.5* 1,054.2* 1,914.7*   Basic Metabolic Panel: Recent Labs  Lab 09/02/19 0347 09/03/19 0450  NA 140 137  K 5.3* 4.8  CL 100 98  CO2 24 22  GLUCOSE 128* 86  BUN 79* 38*  CREATININE 11.67* 7.94*  CALCIUM 9.5 9.0  PHOS  --  4.4   Liver Function Tests: Recent Labs  Lab 09/02/19 0347 09/03/19 0450  AST 214*  --   ALT 386*  --   ALKPHOS 440*  --   BILITOT 0.8  --   PROT 7.8  --    ALBUMIN 4.1 3.4*   No results for input(s): LIPASE, AMYLASE in the last 168 hours. No results for input(s): AMMONIA in the last 168 hours. CBC: Recent Labs  Lab 09/02/19 0347 09/03/19 0450  WBC 10.3 7.2  NEUTROABS 6.4  --   HGB 11.9* 11.3*  HCT 37.3* 35.1*  MCV 116.9* 114.7*  PLT 259 255   Cardiac Enzymes: No results for input(s): CKTOTAL, CKMB, CKMBINDEX, TROPONINI in the last 168 hours. BNP: Invalid input(s): POCBNP CBG: No results for input(s): GLUCAP in the last 168 hours. D-Dimer No results for input(s): DDIMER in the last 72 hours. Hgb A1c No results for input(s): HGBA1C in the last 72 hours. Lipid Profile No results for input(s): CHOL, HDL, LDLCALC, TRIG, CHOLHDL, LDLDIRECT in the last 72 hours. Thyroid function studies No results for input(s): TSH, T4TOTAL, T3FREE, THYROIDAB in the last 72 hours.  Invalid input(s): FREET3 Anemia work up No results for input(s): VITAMINB12, FOLATE, FERRITIN, TIBC, IRON, RETICCTPCT in the last 72 hours. Urinalysis    Component Value Date/Time   COLORURINE YELLOW 02/17/2018 0540   APPEARANCEUR CLOUDY (A) 02/17/2018 0540   LABSPEC 1.010 02/17/2018 0540   PHURINE 5.0 02/17/2018 0540   GLUCOSEU NEGATIVE 02/17/2018 0540   HGBUR LARGE (A) 02/17/2018 0540   BILIRUBINUR NEGATIVE 02/17/2018 0540   BILIRUBINUR negative 04/16/2013 1447   KETONESUR NEGATIVE 02/17/2018 0540   PROTEINUR 100 (A) 02/17/2018 0540   UROBILINOGEN 0.2 05/11/2014 1352   NITRITE NEGATIVE 02/17/2018 0540   LEUKOCYTESUR NEGATIVE 02/17/2018 0540   Sepsis Labs Invalid input(s): PROCALCITONIN,  WBC,  LACTICIDVEN Microbiology Recent Results (from the past 240 hour(s))  SARS Coronavirus 2 [facility] order, Performed in [facility] hospital lab) Nasopharyngeal Nasopharyngeal Swab     Status: None   Collection Time: 09/02/19  3:47 AM   Specimen: Nasopharyngeal Swab  Result Value Ref Range Status   SARS Coronavirus 2 NEGATIVE NEGATIVE Final    Comment: (NOTE) If  result is NEGATIVE SARS-CoV-2 target nucleic acids are NOT DETECTED. The SARS-CoV-2 RNA is generally detectable in upper and lower  respiratory specimens during the acute phase of infection. The lowest  concentration of SARS-CoV-2 viral copies this assay can detect is 250  copies / mL. A negative result does not  preclude SARS-CoV-2 infection  and should not be used as the sole basis for treatment or other  patient management decisions.  A negative result may occur with  improper specimen collection / handling, submission of specimen other  than nasopharyngeal swab, presence of viral mutation(s) within the  areas targeted by this assay, and inadequate number of viral copies  (<250 copies / mL). A negative result must be combined with clinical  observations, patient history, and epidemiological information. If result is POSITIVE SARS-CoV-2 target nucleic acids are DETECTED. The SARS-CoV-2 RNA is generally detectable in upper and lower  respiratory specimens dur ing the acute phase of infection.  Positive  results are indicative of active infection with SARS-CoV-2.  Clinical  correlation with patient history and other diagnostic information is  necessary to determine patient infection status.  Positive results do  not rule out bacterial infection or co-infection with other viruses. If result is PRESUMPTIVE POSTIVE SARS-CoV-2 nucleic acids MAY BE PRESENT.   A presumptive positive result was obtained on the submitted specimen  and confirmed on repeat testing.  While 2019 novel coronavirus  (SARS-CoV-2) nucleic acids may be present in the submitted sample  additional confirmatory testing may be necessary for epidemiological  and / or clinical management purposes  to differentiate between  SARS-CoV-2 and other Sarbecovirus currently known to infect humans.  If clinically indicated additional testing with an alternate test  methodology [phone]) is advised. The SARS-CoV-2 RNA is generally   detectable in upper and lower respiratory sp ecimens during the acute  phase of infection. The expected result is Negative. Fact Sheet for Patients:  StrictlyIdeas.no Fact Sheet for Healthcare Providers: BankingDealers.co.za This test is not yet approved or cleared by the [place] FDA and has been authorized for detection and/or diagnosis of SARS-CoV-2 by FDA under an Emergency Use Authorization (EUA).  This EUA will remain in effect (meaning this test can be used) for the duration of the COVID-19 declaration under Section 564(b)(1) of the Act, 21 U.S.C. section 360bbb-3(b)(1), unless the authorization is terminated or revoked sooner. Performed at [facility], [place] [address], [place], [place] [address]      Time coordinating discharge: Over 30 minutes  SIGNED:   Darliss Cheney, MD  [facility] 09/03/2019, 10:13 AM Pager [phone]  If 7PM-7AM, please contact night-coverage [email or web address] Password [email or web address]

## 2019-09-03 NOTE — Progress Notes (Signed)
.   MEWS/VS Documentation      09/02/2019 1611 09/02/2019 1900 09/02/2019 2201 09/03/2019 0411   MEWS Score:  2  2  2  2    MEWS Score Color:  Yellow  Yellow  Yellow  Yellow   Resp:  (!) 24  -  -  -   Pulse:  82  -  -  -   BP:  100/64  -  -  -   Temp:  98.5 F (36.9 C)  -  -  -   O2 Device:  Nasal Cannula  -  Nasal Cannula  -   O2 Flow Rate (L/min):  -  -  2 L/min  -   Level of Consciousness:  -  -  Alert  Alert    Patient mew remains in yellow with mews noting low blood pressure and brief elevation in respirations with exertion.  MD aware of patient current, ongoing status.  Will continue to monitor.

## 2019-09-04 ENCOUNTER — Telehealth: Payer: Self-pay | Admitting: *Deleted

## 2019-09-04 ENCOUNTER — Other Ambulatory Visit: Payer: Self-pay

## 2019-09-04 DIAGNOSIS — Z992 Dependence on renal dialysis: Secondary | ICD-10-CM | POA: Diagnosis not present

## 2019-09-04 DIAGNOSIS — N186 End stage renal disease: Secondary | ICD-10-CM | POA: Diagnosis not present

## 2019-09-04 DIAGNOSIS — N2581 Secondary hyperparathyroidism of renal origin: Secondary | ICD-10-CM | POA: Diagnosis not present

## 2019-09-04 NOTE — Patient Outreach (Signed)
[place] [facility]) Care Management  09/04/2019  [facility] Chrisman 1941-02-27 [id number]     Transition of Care Referral  Referral Date:09/03/2019 Referral Source: [place] Date of Admission: 09/02/2019 Diagnosis: acute resp. failure Date of Discharge: 09/03/2019 Facility: [place] Medicare PCP does [facility]   Outreach attempt # 1 to patient. Spoke briefly with patient as he voices he was in route home from dialysis treatment. He denies any acute issues or concerns. States he feels a little weak after dialysis treatment but otherwise okay. He lives with supportive spouse who takes him to dialysis and MD appts if he does not feel up to it. He has cardiology follow up appt on 09/17/2019. He states that he does not go to see PCp until Jan. Of next year. RN Cm discussed with pt the recommendations to follow up with PCP within 2wks for hospital discharge. RN CM encouraged pt. To call office to see if he needed to be seen. He voiced understanding. Discussed [facility] services with patient. He is agreeable to RN CM calling back at another time to complete initial assessment.     Plan: RN CM will send successful outreach letter to patient. RN CM will make outreach attempt to patient within two weeks for follow up.    Enzo Montgomery, RN,BSN,CCM [place] Management Telephonic Care Management Coordinator Direct Phone: [phone] Toll Free: [phone] Fax: [phone]

## 2019-09-04 NOTE — Telephone Encounter (Signed)
LVM for pt to call office and schedule appt 

## 2019-09-05 ENCOUNTER — Emergency Department (HOSPITAL_COMMUNITY): Payer: Medicare HMO

## 2019-09-05 ENCOUNTER — Encounter (HOSPITAL_COMMUNITY): Payer: Self-pay

## 2019-09-05 ENCOUNTER — Other Ambulatory Visit: Payer: Self-pay

## 2019-09-05 ENCOUNTER — Emergency Department (HOSPITAL_COMMUNITY)
Admission: EM | Admit: 2019-09-05 | Discharge: 2019-09-06 | Disposition: A | Discharge status: Home / Self Care | Payer: Medicare HMO | Attending: Emergency Medicine | Admitting: Emergency Medicine

## 2019-09-05 DIAGNOSIS — R5381 Other malaise: Secondary | ICD-10-CM | POA: Diagnosis not present

## 2019-09-05 DIAGNOSIS — Y999 Unspecified external cause status: Secondary | ICD-10-CM | POA: Diagnosis not present

## 2019-09-05 DIAGNOSIS — W19XXXA Unspecified fall, initial encounter: Secondary | ICD-10-CM | POA: Diagnosis not present

## 2019-09-05 DIAGNOSIS — B20 Human immunodeficiency virus [HIV] disease: Secondary | ICD-10-CM | POA: Insufficient documentation

## 2019-09-05 DIAGNOSIS — Z8546 Personal history of malignant neoplasm of prostate: Secondary | ICD-10-CM | POA: Diagnosis not present

## 2019-09-05 DIAGNOSIS — I132 Hypertensive heart and chronic kidney disease with heart failure and with stage 5 chronic kidney disease, or end stage renal disease: Secondary | ICD-10-CM | POA: Insufficient documentation

## 2019-09-05 DIAGNOSIS — I2510 Atherosclerotic heart disease of native coronary artery without angina pectoris: Secondary | ICD-10-CM | POA: Diagnosis not present

## 2019-09-05 DIAGNOSIS — Z79899 Other long term (current) drug therapy: Secondary | ICD-10-CM | POA: Diagnosis not present

## 2019-09-05 DIAGNOSIS — W010XXA Fall on same level from slipping, tripping and stumbling without subsequent striking against object, initial encounter: Secondary | ICD-10-CM | POA: Insufficient documentation

## 2019-09-05 DIAGNOSIS — S01511A Laceration without foreign body of lip, initial encounter: Secondary | ICD-10-CM

## 2019-09-05 DIAGNOSIS — I5042 Chronic combined systolic (congestive) and diastolic (congestive) heart failure: Secondary | ICD-10-CM | POA: Diagnosis not present

## 2019-09-05 DIAGNOSIS — N186 End stage renal disease: Secondary | ICD-10-CM | POA: Insufficient documentation

## 2019-09-05 DIAGNOSIS — S0993XA Unspecified injury of face, initial encounter: Secondary | ICD-10-CM | POA: Diagnosis present

## 2019-09-05 DIAGNOSIS — Z7901 Long term (current) use of anticoagulants: Secondary | ICD-10-CM | POA: Diagnosis not present

## 2019-09-05 DIAGNOSIS — S022XXA Fracture of nasal bones, initial encounter for closed fracture: Secondary | ICD-10-CM | POA: Diagnosis not present

## 2019-09-05 DIAGNOSIS — S0990XA Unspecified injury of head, initial encounter: Secondary | ICD-10-CM

## 2019-09-05 DIAGNOSIS — Y9289 Other specified places as the place of occurrence of the external cause: Secondary | ICD-10-CM | POA: Diagnosis not present

## 2019-09-05 DIAGNOSIS — Z992 Dependence on renal dialysis: Secondary | ICD-10-CM | POA: Diagnosis not present

## 2019-09-05 DIAGNOSIS — S0003XA Contusion of scalp, initial encounter: Secondary | ICD-10-CM | POA: Diagnosis not present

## 2019-09-05 DIAGNOSIS — S0010XA Contusion of unspecified eyelid and periocular area, initial encounter: Secondary | ICD-10-CM | POA: Diagnosis not present

## 2019-09-05 DIAGNOSIS — Z87891 Personal history of nicotine dependence: Secondary | ICD-10-CM | POA: Insufficient documentation

## 2019-09-05 DIAGNOSIS — R519 Headache, unspecified: Secondary | ICD-10-CM | POA: Diagnosis not present

## 2019-09-05 DIAGNOSIS — Y939 Activity, unspecified: Secondary | ICD-10-CM | POA: Diagnosis not present

## 2019-09-05 LAB — BASIC METABOLIC PANEL
Anion gap: 15 (ref 5–15)
BUN: 42 mg/dL — ABNORMAL HIGH (ref 8–23)
CO2: 26 mmol/L (ref 22–32)
Calcium: 9.1 mg/dL (ref 8.9–10.3)
Chloride: 97 mmol/L — ABNORMAL LOW (ref 98–111)
Creatinine, Ser: 8.42 mg/dL — ABNORMAL HIGH (ref 0.61–1.24)
GFR calc Af Amer: 6 mL/min — ABNORMAL LOW (ref >60)
GFR calc non Af Amer: 5 mL/min — ABNORMAL LOW (ref >60)
Glucose, Bld: 117 mg/dL — ABNORMAL HIGH (ref 70–99)
Potassium: 4.1 mmol/L (ref 3.5–5.1)
Sodium: 138 mmol/L (ref 135–145)

## 2019-09-05 LAB — CBC WITH DIFFERENTIAL/PLATELET
Abs Immature Granulocytes: 0 10*3/uL (ref 0.00–0.07)
Basophils Absolute: 0.1 10*3/uL (ref 0.0–0.1)
Basophils Relative: 1 %
Eosinophils Absolute: 0.4 10*3/uL (ref 0.0–0.5)
Eosinophils Relative: 5 %
HCT: 33.9 % — ABNORMAL LOW (ref 39.0–52.0)
Hemoglobin: 11.1 g/dL — ABNORMAL LOW (ref 13.0–17.0)
Lymphocytes Relative: 11 %
Lymphs Abs: 0.9 10*3/uL (ref 0.7–4.0)
MCH: 37.5 pg — ABNORMAL HIGH (ref 26.0–34.0)
MCHC: 32.7 g/dL (ref 30.0–36.0)
MCV: 114.5 fL — ABNORMAL HIGH (ref 80.0–100.0)
Monocytes Absolute: 0.3 10*3/uL (ref 0.1–1.0)
Monocytes Relative: 4 %
Neutro Abs: 6.2 10*3/uL (ref 1.7–7.7)
Neutrophils Relative %: 79 %
Platelets: 226 10*3/uL (ref 150–400)
RBC: 2.96 MIL/uL — ABNORMAL LOW (ref 4.22–5.81)
RDW: 14.9 % (ref 11.5–15.5)
WBC: 7.8 10*3/uL (ref 4.0–10.5)
nRBC: 0 % (ref 0.0–0.2)
nRBC: 0 /100 WBC

## 2019-09-05 LAB — POCT INR: INR: 2.4 (ref 2.0–3.0)

## 2019-09-05 LAB — PROTIME-INR
INR: 2.8 — ABNORMAL HIGH (ref 0.8–1.2)
Prothrombin Time: 28.7 seconds — ABNORMAL HIGH (ref 11.4–15.2)

## 2019-09-05 MED ORDER — BIKTARVY 50-200-25 MG PO TABS: 1.0000 | ORAL_TABLET | Freq: Every day | ORAL | 5 refills | Status: DC

## 2019-09-05 NOTE — ED Provider Notes (Signed)
[facility] EMERGENCY DEPARTMENT Provider Note   CSN: [id number] Arrival date & time: 09/05/19  1358     History   Chief Complaint Chief Complaint  Patient presents with  . Fall    HPI David [name] is a 78 y.o. male.       Patient is a 78 year old male with history of end-stage renal disease on hemodialysis, congestive heart failure, HIV disease, GERD, and A. fib.  Patient is anticoagulated on Coumadin.  He presents today with complaints of fall.  Patient was walking and tripped over some brush.  He apparently landed on the sidewalk.  He has a laceration to the inside of his lip and a bump on his forehead.  He denies any loss of consciousness or headache.  Patient tells me his last INR was two-point something.  The history is provided by the patient.  Fall This is a new problem. The current episode started less than 1 hour ago. The problem occurs constantly. The problem has not changed since onset.Pertinent negatives include no chest pain and no headaches. Nothing aggravates the symptoms. Nothing relieves the symptoms. He has tried nothing for the symptoms.    Past Medical History:  Diagnosis Date  . 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 Acute on chronic systolic and diastolic heart failure, NYHA class 4 ([place])   . Anemia, iron deficiency 11/15/2011  . Arthritis    "hands, right knee, feet" (02/21/2018)  . Atrial fibrillation ([place])   . Cancer [facility])    hx of prostate; s/p radioactive seed implant 10/2009 Dr Janice Norrie  . Cardiac arrest ([place]) 02/17/2018  . CHF (congestive heart failure) ([place])   . Chronic combined systolic and diastolic heart failure, NYHA class 3 ([place]) 06/2010   felt to be secondary to hypertensive cardiomyopathy  . Chronic lower back pain   . CKD (chronic kidney disease) stage V requiring chronic dialysis ([place])   . ESRD on dialysis [facility])    started 02/2018 [place]  . GERD (gastroesophageal reflux disease)   .  Glaucoma    "I had surgery and dont have it any more"  . Gout    daily RX (02/21/2018)  . Heart murmur    "mild" per pt  . Hepatitis    years ago  . HIV (human immunodeficiency virus infection) ([place])   . HIV infection ([place])   . Hyperlipidemia   . Hypertension    followed by [facility] and Vascular (Dr Dani Gobble Croitoru)  . Hypertension   . Nonischemic cardiomyopathy ([place])   . Pneumonia 11/2017  . Prostate cancer ([place])   . Sinus bradycardia   . Sleep apnea    does not use a cpap  . Stroke [facility])    "mini stroke" years ago    Patient Active Problem List   Diagnosis Date Noted  . Hyperkalemia 09/02/2019  . Unspecified open wound of right upper arm, initial encounter 06/21/2019  . Shortness of breath 04/12/2019  . 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 Dependence on renal dialysis ([place]) 03/27/2019  . Hypertensive heart and chronic kidney disease with heart failure and with stage 5 chronic kidney disease, or end stage renal disease ([place]) 03/27/2019  . Mesenteric ischemia ([place])   . Colonic ulcer   . Right lower quadrant pain   . Abnormal CT of the abdomen   . Ischemic colitis ([place])   . Colitis presumed infectious 03/09/2019  . Acute respiratory failure with hypoxia and hypercapnia ([place]) 03/04/2019  . Pruritus, unspecified  02/21/2019  . Acute pulmonary edema ([place]) 02/11/2019  . Pain, unspecified 01/21/2019  . Atrial fibrillation with RVR ([place]) 12/05/2018  . Paroxysmal atrial fibrillation ([place]) 11/16/2018  . LBBB (left bundle branch block) 11/16/2018  . Encounter for monitoring amiodarone therapy 11/16/2018  . Volume overload 11/01/2018  . Pulmonary edema 10/21/2018  . Gout 10/21/2018  . Hypertensive emergency 10/21/2018  . Chronic combined systolic and diastolic heart failure, NYHA class 3 ([place]) 10/21/2018  . Leukocytosis 10/21/2018  . Chills (without fever) 09/24/2018  . Gastroesophageal reflux disease 08/28/2018  . Chronic anticoagulation 04/03/2018  . CAD (coronary  artery disease) 04/03/2018  . Palliative care encounter 03/27/2018  . Atrial fibrillation, chronic ([place]) 03/25/2018  . Anemia in chronic kidney disease 03/12/2018  . Coagulation defect, unspecified ([place]) 03/12/2018  . Secondary hyperparathyroidism of renal origin ([place]) 03/12/2018  . Palliative care by specialist   . Advance care planning   . Goals of care, counseling/discussion   . Respiratory arrest ([place])   . Acute on chronic systolic heart failure ([place])   . Chronic obstructive pulmonary disease ([place])   . HIV (human immunodeficiency virus infection) ([place])   . ESRD (end stage renal disease) ([place])   . Acute respiratory failure with hypoxia ([place]) 01/21/2018  . Pneumonia   . Aspiration pneumonia of both lower lobes due to regurgitated food ([place])   . Sepsis due to pneumonia ([place]) 11/22/2017  . AIDS ([place]) 05/14/2014  . 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 Gastroparesis 05/06/2014  . Protein-calorie malnutrition, severe ([place]) 05/05/2014  . ESRD on hemodialysis ([place]) 05/02/2014  . Hemodialysis-associated hypotension 05/02/2014  . Leaking of conjunctival drainage bleb 05/01/2014  . POAG (primary open-angle glaucoma) 05/01/2014  . Loss of weight 04/29/2014  . Conjunctivitis 04/29/2014  . Nonischemic dilated cardiomyopathy ([place]) 09/10/2013  . Low back pain 04/09/2012  . Osteoarthritis of hip 12/29/2011  . Iron deficiency anemia 06/28/2011  . Macrocytic anemia 04/02/2011  . 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 Hyperlipidemia 11/30/2010  . Transient cerebral ischemia 11/30/2010  . HYPERGLYCEMIA 11/30/2010    Past Surgical History:  Procedure Laterality Date  . AV FISTULA PLACEMENT Right 10/13/2016   Procedure: ARTERIOVENOUS (AV) FISTULA CREATION;  Surgeon: [name], MD;  Location: [facility];  Service: Vascular;  Laterality: Right;  . AV FISTULA PLACEMENT Left 02/25/2018   Procedure: INSERTION OF ARTERIOVENOUS (AV) GORE-TEX GRAFT LEFT UPPER ARM;  Surgeon: Angelia Mould, MD;  Location: [place];  Service: Vascular;  Laterality: Left;  . AV FISTULA PLACEMENT Right 08/07/2018   Procedure: Creation of right arm brachiocephalic Fistula;  Surgeon: Waynetta Sandy, MD;  Location: [facility];  Service: Vascular;  Laterality: Right;  . BASCILIC VEIN TRANSPOSITION Left 06/24/2015   Procedure: BASILIC VEIN TRANSPOSITION;  Surgeon: Mal Misty, MD;  Location: [facility];  Service: Vascular;  Laterality: Left;  . BIOPSY  03/14/2019   Procedure: BIOPSY;  Surgeon: Irene Shipper, MD;  Location: [place];  Service: Endoscopy;;  . CATARACT EXTRACTION, BILATERAL Bilateral   . COLONOSCOPY WITH PROPOFOL N/A 03/14/2019   Procedure: COLONOSCOPY WITH PROPOFOL;  Surgeon: Irene Shipper, MD;  Location: [facility] ENDOSCOPY;  Service: Endoscopy;  Laterality: N/A;  . ESOPHAGOGASTRODUODENOSCOPY (EGD) WITH PROPOFOL N/A 05/05/2014   Procedure: ESOPHAGOGASTRODUODENOSCOPY (EGD) WITH PROPOFOL;  Surgeon: Missy Sabins, MD;  Location: [facility] ENDOSCOPY;  Service: Endoscopy;  Laterality: N/A;  . EYE SURGERY Bilateral    cataract surgery   . GLAUCOMA SURGERY Bilateral   . INSERTION OF ARTERIOVENOUS (AV) ARTEGRAFT  ARM  10/09/2018   Procedure: INSERTION OF ARTERIOVENOUS (AV) 16mm x 41cm ARTEGRAFT LEFT UPPER ARM;  Surgeon: Waynetta Sandy, MD;  Location: [place];  Service: Vascular;;  . INSERTION OF DIALYSIS CATHETER Right 07/14/2019   Procedure: Insertion Of Dialysis Catheter;  Surgeon: Elam Dutch, MD;  Location: [facility] OR;  Service: Vascular;  Laterality: Right;  placed right Internal Jugular  . INSERTION PROSTATE RADIATION SEED    . IR FLUORO GUIDE CV LINE RIGHT  02/18/2018  . IR REMOVAL TUN CV CATH W/O FL  12/06/2018  . IR US GUIDE VASC ACCESS RIGHT  02/18/2018  . LEFT HEART CATH AND CORONARY ANGIOGRAPHY N/A 02/20/2018   Procedure: LEFT HEART CATH AND CORONARY ANGIOGRAPHY;  Surgeon: Jettie Booze, MD;  Location: [place] CV LAB;;; 50% ostial OM2, 25% mLAD.  [facility] Kitchen LEFT HEART CATH AND CORONARY  ANGIOGRAPHY  2004   mildly depressed LV systolic fx EF 39%,QZESPQ coronaries/abdominal aorta/renal arteries.  [facility] Kitchen NM MYOCAR PERF WALL MOTION  02/21/2010   No ischemia or infarction.  EF 27%.  [facility] Kitchen RADIOACTIVE SEED IMPLANT  2010   prostate cancer  . REVISION OF ARTERIOVENOUS GORETEX GRAFT Right 07/11/2019   Procedure: REVISION OF ARTERIOVENOUS GORETEX GRAFT RIGHT ARM WITH ARTEGRAFT;  Surgeon: Serafina Mitchell, MD;  Location: [place];  Service: Vascular;  Laterality: Right;  . SHUNTOGRAM Right 07/14/2019   Procedure: Shuntogram;  Surgeon: Elam Dutch, MD;  Location: [place];  Service: Vascular;  Laterality: Right;  . THROMBECTOMY AND REVISION OF ARTERIOVENTOUS (AV) GORETEX  GRAFT Right 07/14/2019   Procedure: THROMBECTOMY AND REVISION OF ARTERIOVENTOUS (AV) GORETEX  GRAFT;  Surgeon: Elam Dutch, MD;  Location: [place];  Service: Vascular;  Laterality: Right;  . THROMBECTOMY W/ EMBOLECTOMY Left 02/26/2018   Procedure: THROMBECTOMY ARTERIOVENOUS GRAFT;  Surgeon: Angelia Mould, MD;  Location: [place];  Service: Vascular;  Laterality: Left;  . TRANSTHORACIC ECHOCARDIOGRAM  02/2012   [facility]) EF 45-50% with mild global HK.  Mild LVH,LA mod. dilated,mild-mod. 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 AI.  [facility] Kitchen TRANSTHORACIC ECHOCARDIOGRAM  9/'18; 3/'19   a) Severely reduced EF.  25 from 30%.  GR 1 DD with diffuse ecchymosis. Mild AS & AI.  Mild LA/RA dilation; b) (post PEA arest):   Moderately dilated LV with moderate concentric virtually.  EF 15 to 20%.  GR 1 DD.  Paradoxical septal motion. Mild AI.  Mild LA dilation.  Poorly visualized RV.  [facility] Kitchen TRANSTHORACIC ECHOCARDIOGRAM  02/2019   EF 20-25%.  Unable to assess diastolic function (A. Fib).  No LV apical thrombus.  Mild AI.  Mildly reduced RV function, however poorly visualized.  [facility] Kitchen ULTRASOUND GUIDANCE FOR VASCULAR ACCESS  02/20/2018   Procedure: Ultrasound Guidance For Vascular Access;  Surgeon: Jettie Booze, MD;  Location:  [place] CV LAB;  Service: Cardiovascular;;  . UPPER EXTREMITY VENOGRAPHY N/A 07/08/2018   Procedure: UPPER EXTREMITY VENOGRAPHY;  Surgeon: Waynetta Sandy, MD;  Location: [place] CV LAB;  Service: Cardiovascular;  Laterality: N/A;  Bilateral        Home Medications    Prior to Admission medications   Medication Sig Start Date End Date Taking? Authorizing Provider  acetaminophen (TYLENOL) 500 MG tablet Take 1,000 mg by mouth every 6 (six) hours as needed for moderate pain.    [provider]  allopurinol (ZYLOPRIM) 100 MG tablet 1 tablet by mouth 3x weekly after dialysis. Patient taking differently: Take 100 mg by mouth daily  at 2 PM.  06/20/19   [name], NP  amiodarone (PACERONE) 200 MG tablet Take 1 tablet (200 mg total) by mouth daily. 05/12/19   Croitoru, Mihai, MD  amoxicillin (AMOXIL) 500 MG capsule Take QDAILY in the am, then on dialysis days take ANOTHER dose AFTER HD Patient taking differently: Take 500 mg by mouth See admin instructions. 500 mg every day and on dialysis take 1000 mg 04/02/19   Tommy Medal, Lavell Islam, MD  bictegravir-emtricitabine-tenofovir AF ([name]) 50-200-25 MG TABS tablet Take 1 tablet by mouth daily. 09/05/19   Truman Hayward, MD  brimonidine (ALPHAGAN) 0.15 % ophthalmic solution Place 1 drop into the left eye 2 (two) times daily. 11/15/17   [provider]  dorzolamide-timolol (COSOPT) 22.3-6.8 MG/ML ophthalmic solution Place 1 drop into the left eye 2 (two) times daily. 12/17/17   [provider]  ferric citrate (AURYXIA) 1 GM 210 MG(Fe) tablet Take 420 mg by mouth 3 (three) times daily with meals.    [provider]  latanoprost (XALATAN) 0.005 % ophthalmic solution Place 1 drop into the left eye at bedtime.     [provider]  loratadine (CLARITIN) 10 MG tablet Take 10 mg by mouth daily.    [provider]  midodrine (PROAMATINE) 10 MG tablet Take 1 tablet (10 mg total) by  mouth as directed. Twice a day on the day of HD Tuesday, Thursday, Saturday (1 in AM and 1 at [place]). Take 1/2 tablet (5mg ) a day on non HD days Patient taking differently: Take 10 mg by mouth See admin instructions. Take 1 tablet (10 mg) by mouth 3 times daily on Tuesdays, Thursdays, & Saturdays. 04/22/19   Leonie Man, MD  multivitamin (RENA-VIT) TABS tablet Take 1 tablet by mouth daily at 2 PM.     [provider]  warfarin (COUMADIN) 5 MG tablet Take 1/2 to 1 tablet by mouth daily as directed by coumadin clinic Patient taking differently: Take 2.5-5 mg by mouth See admin instructions. 2.5 mg on Mondays and fridays. All other days is 5 mg 07/25/19   Leonie Man, MD    Family History Family History  Problem Relation Age of Onset  . Hypertension Mother   . Thyroid disease Mother   . Cholelithiasis Daughter   . Cholelithiasis Son   . Hypertension Maternal Grandmother   . Diabetes Maternal Grandmother   . Heart attack Neg Hx   . Hyperlipidemia Neg Hx     Social History Social History   Tobacco Use  . Smoking status: Former Smoker    Packs/day: 1.00    Years: 30.00    Pack years: 30.00    Types: Cigarettes    Quit date: 2006    Years since quitting: 14.7  . Smokeless tobacco: Never Used  Substance Use Topics  . Alcohol use: Yes    Alcohol/week: 1.0 standard drinks    Types: 1 Shots of liquor per week  . Drug use: Never     Allergies   Dextromethorphan-guaifenesin, Tocotrienols, and Losartan potassium   Review of Systems Review of Systems  Cardiovascular: Negative for chest pain.  Neurological: Negative for headaches.  All other systems reviewed and are negative.    Physical Exam Updated Vital Signs BP (!) 143/76   Pulse 90   Resp 18   Ht 6\' 2"  (1.88 m)   Wt 83.5 kg   SpO2 99%   BMI 23.62 kg/m   Physical Exam Vitals signs and nursing note  reviewed.  Constitutional:      General: He is not in acute distress.    Appearance: He is  well-developed. He is not diaphoretic.  HENT:     Head: Normocephalic and atraumatic.     Mouth/Throat:     Mouth: Mucous membranes are moist.     Comments: Dentition appears intact.  There is a laceration to the inside of the lower lip that measures approximately 1.5 cm.  There is also a smaller laceration to the outside of the lower lip just near the vermilion border.  This measures approximately 0.5 cm.  There is no jaw malocclusion. Neck:     Musculoskeletal: Normal range of motion and neck supple.  Cardiovascular:     Rate and Rhythm: Normal rate and regular rhythm.     Heart sounds: No murmur. No friction rub.  Pulmonary:     Effort: Pulmonary effort is normal. No respiratory distress.     Breath sounds: Normal breath sounds. No wheezing or rales.  Abdominal:     General: Bowel sounds are normal. There is no distension.     Palpations: Abdomen is soft.     Tenderness: There is no abdominal tenderness.  Musculoskeletal: Normal range of motion.  Skin:    General: Skin is warm and dry.  Neurological:     General: No focal deficit present.     Mental Status: He is alert and oriented to person, place, and time.     Cranial Nerves: No cranial nerve deficit.     Sensory: No sensory deficit.     Motor: No weakness.     Coordination: Coordination normal.      ED Treatments / Results  Labs (all labs ordered are listed, but only abnormal results are displayed) Labs Reviewed  BASIC METABOLIC PANEL  CBC WITH DIFFERENTIAL/PLATELET  PROTIME-INR    EKG None  Radiology No results found.  Procedures Procedures (including critical care time)  Medications Ordered in ED Medications - No data to display   Initial Impression / Assessment and Plan / ED Course  I have reviewed the triage vital signs and the nursing notes.  Pertinent labs & imaging results that were available during my care of the patient were reviewed by me and considered in my medical decision making (see chart  for details).  Patient with history of A. fib and CHF on Coumadin presenting for evaluation of fall.  Patient tripped over some brush and landed on the concrete striking his face on the ground.  He has a laceration to the lower lip that I do not feel requires repair.  Due to the head injury and being on Coumadin, patient will go for a head CT.  Patient signed out to Dr. Tamera Punt at shift change.  She will obtain the results of the CT scan and determine the final disposition.  Final Clinical Impressions(s) / ED Diagnoses   Final diagnoses:  None    ED Discharge Orders    None       Veryl Speak, MD 09/07/19 2332

## 2019-09-05 NOTE — ED Notes (Signed)
CT contacted, stated he was the fourth one in line

## 2019-09-05 NOTE — ED Provider Notes (Signed)
Care taken over from Dr. Stark Jock.  PT with mechanical fall, head injury, awaiting CT, INR.  Has small lac to upper lip, can do oral rinses, abx on discharge.  CT scan shows a nasal fracture but no intracranial hemorrhage.  I discussed these findings with the patient.  He was given a referral to follow-up with ENT.  He actually is currently on amoxicillin for his teeth and will continue that at home.  I advised him and oral care for the lip laceration.  Return precautions were given.   Malvin Johns, MD 09/05/19 2114

## 2019-09-05 NOTE — ED Triage Notes (Signed)
Patient experienced a fall at his home. Patient stated he did not lose consciousness and is alert and oriented x4. Patient called 911 because he had a hematoma above the right eyebrow and a laceration to the upper lip.

## 2019-09-05 NOTE — ED Notes (Signed)
Laceration noted on right lower lip. Bleeding controlled

## 2019-09-05 NOTE — Discharge Instructions (Signed)
Continue taking your amoxicillin as directed.  You need to make an appointment to follow-up with an ENT regarding your nasal bone fracture.  Regarding the mouth laceration, eat soft foods until it is closed.  Rinse your mouth out after eating.  Return to emergency room if you have any worsening symptoms including worsening headaches, vomiting, dizziness, difficulty ambulating or other worsening symptoms.

## 2019-09-06 DIAGNOSIS — N186 End stage renal disease: Secondary | ICD-10-CM | POA: Diagnosis not present

## 2019-09-06 DIAGNOSIS — N2581 Secondary hyperparathyroidism of renal origin: Secondary | ICD-10-CM | POA: Diagnosis not present

## 2019-09-06 DIAGNOSIS — Z992 Dependence on renal dialysis: Secondary | ICD-10-CM | POA: Diagnosis not present

## 2019-09-06 NOTE — ED Notes (Signed)
Patient verbalizes understanding of discharge instructions. Opportunity for questioning and answers were provided. Armband removed by staff, pt discharged from ED.  

## 2019-09-08 ENCOUNTER — Encounter: Payer: Self-pay | Admitting: Cardiovascular Disease

## 2019-09-08 ENCOUNTER — Telehealth: Payer: Self-pay | Admitting: Family

## 2019-09-08 ENCOUNTER — Telehealth: Payer: Self-pay | Admitting: Pharmacy Technician

## 2019-09-08 ENCOUNTER — Ambulatory Visit (INDEPENDENT_AMBULATORY_CARE_PROVIDER_SITE_OTHER): Payer: Medicare HMO | Admitting: Pharmacist

## 2019-09-08 ENCOUNTER — Telehealth: Payer: Self-pay

## 2019-09-08 ENCOUNTER — Encounter (HOSPITAL_COMMUNITY): Payer: Self-pay | Admitting: Emergency Medicine

## 2019-09-08 ENCOUNTER — Emergency Department (HOSPITAL_COMMUNITY)
Admission: EM | Admit: 2019-09-08 | Discharge: 2019-09-08 | Disposition: A | Discharge status: Home / Self Care | Payer: Medicare HMO | Attending: Emergency Medicine | Admitting: Emergency Medicine

## 2019-09-08 DIAGNOSIS — R040 Epistaxis: Secondary | ICD-10-CM | POA: Diagnosis not present

## 2019-09-08 DIAGNOSIS — I5042 Chronic combined systolic (congestive) and diastolic (congestive) heart failure: Secondary | ICD-10-CM | POA: Insufficient documentation

## 2019-09-08 DIAGNOSIS — Z87891 Personal history of nicotine dependence: Secondary | ICD-10-CM | POA: Diagnosis not present

## 2019-09-08 DIAGNOSIS — Z21 Asymptomatic human immunodeficiency virus [HIV] infection status: Secondary | ICD-10-CM | POA: Insufficient documentation

## 2019-09-08 DIAGNOSIS — I10 Essential (primary) hypertension: Secondary | ICD-10-CM | POA: Diagnosis not present

## 2019-09-08 DIAGNOSIS — Z992 Dependence on renal dialysis: Secondary | ICD-10-CM | POA: Insufficient documentation

## 2019-09-08 DIAGNOSIS — Z515 Encounter for palliative care: Secondary | ICD-10-CM

## 2019-09-08 DIAGNOSIS — N186 End stage renal disease: Secondary | ICD-10-CM | POA: Insufficient documentation

## 2019-09-08 DIAGNOSIS — I2510 Atherosclerotic heart disease of native coronary artery without angina pectoris: Secondary | ICD-10-CM | POA: Diagnosis not present

## 2019-09-08 DIAGNOSIS — Z7901 Long term (current) use of anticoagulants: Secondary | ICD-10-CM

## 2019-09-08 DIAGNOSIS — I482 Chronic atrial fibrillation, unspecified: Secondary | ICD-10-CM

## 2019-09-08 DIAGNOSIS — Z79899 Other long term (current) drug therapy: Secondary | ICD-10-CM | POA: Diagnosis not present

## 2019-09-08 DIAGNOSIS — I132 Hypertensive heart and chronic kidney disease with heart failure and with stage 5 chronic kidney disease, or end stage renal disease: Secondary | ICD-10-CM | POA: Insufficient documentation

## 2019-09-08 DIAGNOSIS — W19XXXA Unspecified fall, initial encounter: Secondary | ICD-10-CM | POA: Diagnosis not present

## 2019-09-08 LAB — CBC
HCT: 32.3 % — ABNORMAL LOW (ref 39.0–52.0)
Hemoglobin: 10.8 g/dL — ABNORMAL LOW (ref 13.0–17.0)
MCH: 38.3 pg — ABNORMAL HIGH (ref 26.0–34.0)
MCHC: 33.4 g/dL (ref 30.0–36.0)
MCV: 114.5 fL — ABNORMAL HIGH (ref 80.0–100.0)
Platelets: 226 10*3/uL (ref 150–400)
RBC: 2.82 MIL/uL — ABNORMAL LOW (ref 4.22–5.81)
RDW: 15 % (ref 11.5–15.5)
WBC: 6.3 10*3/uL (ref 4.0–10.5)
nRBC: 0 % (ref 0.0–0.2)

## 2019-09-08 LAB — PROTIME-INR
INR: 3.2 — ABNORMAL HIGH (ref 0.8–1.2)
Prothrombin Time: 32.3 seconds — ABNORMAL HIGH (ref 11.4–15.2)

## 2019-09-08 MED ORDER — OXYMETAZOLINE HCL 0.05 % NA SOLN
1.0000 | Freq: Once | NASAL | Status: DC
  Filled 2019-09-08: qty 30

## 2019-09-08 NOTE — Telephone Encounter (Signed)
°  Please call with INR result  1.  Are you calling in regards to an appointment? NO 2.  Are you calling for a refill ? NO  3.  Are you having bleeding issues? NO  4.  Do you need clearance to hold Coumadin? NO   Please route to the Coumadin Clinic Pool

## 2019-09-08 NOTE — Telephone Encounter (Signed)
[facility] Patient Advocate Encounter   I was successful in securing patient a $7500 grant from [employer] (PAF) to provide copayment coverage for [name]. This will make the out of pocket cost $0.     I have spoken with the patient and he will fill out his renewal information for SPAP on October 12 at his lab appointment.    The billing information is as follows and has been shared with [employer].   RxBin: [id number] PCN: [id number] Member ID: [id number] Group ID: [id number] Dates of Eligibility: 09/08/2019 through 09/07/2020  Patient knows to call the office with questions or concerns.  Bartholomew Crews, CPhT [facility] Phone: [phone] Fax: [phone] 09/08/2019 10:10 AM

## 2019-09-08 NOTE — Telephone Encounter (Signed)
Please see anti-coagulation note for details. 

## 2019-09-08 NOTE — Discharge Instructions (Addendum)
You had packing placed in your left nostril for management of nose bleeding.  This should be removed by ENT in 3 days.  Call the ENT office in the morning to schedule this follow-up visit.  You may return for new or concerning symptoms.

## 2019-09-08 NOTE — Telephone Encounter (Signed)
Please contact pt to schedule TCM follow up.

## 2019-09-08 NOTE — ED Provider Notes (Addendum)
[facility] EMERGENCY DEPARTMENT Provider Note   CSN: [id number] Arrival date & time: 09/08/19  0044     History   Chief Complaint Chief Complaint  Patient presents with  . Epistaxis    HPI David [name] is a 78 y.o. male.      Epistaxis Location:  L nare Severity:  Mild Duration:  1 hour Timing:  Constant Progression:  Waxing and waning Chronicity:  New Context: anticoagulants   Context comment:  Hx of nasal bone fx 3 days ago s/p fall Relieved by:  Nothing Ineffective treatments:  Applying pressure Associated symptoms: congestion   Associated symptoms: no facial pain, no fever and no syncope   Risk factors: no frequent nosebleeds     Past Medical History:  Diagnosis Date  . 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 Acute on chronic systolic and diastolic heart failure, NYHA class 4 ([place])   . Anemia, iron deficiency 11/15/2011  . Arthritis    "hands, right knee, feet" (02/21/2018)  . Atrial fibrillation ([place])   . Cancer [facility])    hx of prostate; s/p radioactive seed implant 10/2009 Dr Janice Norrie  . Cardiac arrest ([place]) 02/17/2018  . CHF (congestive heart failure) ([place])   . Chronic combined systolic and diastolic heart failure, NYHA class 3 ([place]) 06/2010   felt to be secondary to hypertensive cardiomyopathy  . Chronic lower back pain   . CKD (chronic kidney disease) stage V requiring chronic dialysis ([place])   . ESRD on dialysis [facility])    started 02/2018 [place]  . GERD (gastroesophageal reflux disease)   . Glaucoma    "I had surgery and dont have it any more"  . Gout    daily RX (02/21/2018)  . Heart murmur    "mild" per pt  . Hepatitis    years ago  . HIV (human immunodeficiency virus infection) ([place])   . HIV infection ([place])   . Hyperlipidemia   . Hypertension    followed by [facility] and Vascular (Dr Dani Gobble Croitoru)  . Hypertension   . Nonischemic cardiomyopathy ([place])   . Pneumonia 11/2017  .  Prostate cancer ([place])   . Sinus bradycardia   . Sleep apnea    does not use a cpap  . Stroke [facility])    "mini stroke" years ago    Patient Active Problem List   Diagnosis Date Noted  . Hyperkalemia 09/02/2019  . Unspecified open wound of right upper arm, initial encounter 06/21/2019  . Shortness of breath 04/12/2019  . 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 Dependence on renal dialysis ([place]) 03/27/2019  . Hypertensive heart and chronic kidney disease with heart failure and with stage 5 chronic kidney disease, or end stage renal disease ([place]) 03/27/2019  . Mesenteric ischemia ([place])   . Colonic ulcer   . Right lower quadrant pain   . Abnormal CT of the abdomen   . Ischemic colitis ([place])   . Colitis presumed infectious 03/09/2019  . Acute respiratory failure with hypoxia and hypercapnia ([place]) 03/04/2019  . Pruritus, unspecified 02/21/2019  . Acute pulmonary edema ([place]) 02/11/2019  . Pain, unspecified 01/21/2019  . Atrial fibrillation with RVR ([place]) 12/05/2018  . Paroxysmal atrial fibrillation ([place]) 11/16/2018  . LBBB (left bundle branch block) 11/16/2018  . Encounter for monitoring amiodarone therapy 11/16/2018  . Volume overload 11/01/2018  . Pulmonary edema 10/21/2018  . Gout 10/21/2018  . Hypertensive emergency 10/21/2018  . Chronic combined systolic and diastolic heart failure, NYHA class 3 (  [place]) 10/21/2018  . Leukocytosis 10/21/2018  . Chills (without fever) 09/24/2018  . Gastroesophageal reflux disease 08/28/2018  . Chronic anticoagulation 04/03/2018  . CAD (coronary artery disease) 04/03/2018  . Palliative care encounter 03/27/2018  . Atrial fibrillation, chronic ([place]) 03/25/2018  . Anemia in chronic kidney disease 03/12/2018  . Coagulation defect, unspecified ([place]) 03/12/2018  . Secondary hyperparathyroidism of renal origin ([place]) 03/12/2018  . Palliative care by specialist   . Advance care planning   . Goals of care, counseling/discussion   . Respiratory arrest ([place])    . Acute on chronic systolic heart failure ([place])   . Chronic obstructive pulmonary disease ([place])   . HIV (human immunodeficiency virus infection) ([place])   . ESRD (end stage renal disease) ([place])   . Acute respiratory failure with hypoxia ([place]) 01/21/2018  . Pneumonia   . Aspiration pneumonia of both lower lobes due to regurgitated food ([place])   . Sepsis due to pneumonia ([place]) 11/22/2017  . AIDS ([place]) 05/14/2014  . 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 Gastroparesis 05/06/2014  . Protein-calorie malnutrition, severe ([place]) 05/05/2014  . ESRD on hemodialysis ([place]) 05/02/2014  . Hemodialysis-associated hypotension 05/02/2014  . Leaking of conjunctival drainage bleb 05/01/2014  . POAG (primary open-angle glaucoma) 05/01/2014  . Loss of weight 04/29/2014  . Conjunctivitis 04/29/2014  . Nonischemic dilated cardiomyopathy ([place]) 09/10/2013  . Low back pain 04/09/2012  . Osteoarthritis of hip 12/29/2011  . Iron deficiency anemia 06/28/2011  . Macrocytic anemia 04/02/2011  . 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 Hyperlipidemia 11/30/2010  . Transient cerebral ischemia 11/30/2010  . HYPERGLYCEMIA 11/30/2010    Past Surgical History:  Procedure Laterality Date  . AV FISTULA PLACEMENT Right 10/13/2016   Procedure: ARTERIOVENOUS (AV) FISTULA CREATION;  Surgeon: [name], MD;  Location: [facility];  Service: Vascular;  Laterality: Right;  . AV FISTULA PLACEMENT Left 02/25/2018   Procedure: INSERTION OF ARTERIOVENOUS (AV) GORE-TEX GRAFT LEFT UPPER ARM;  Surgeon: Angelia Mould, MD;  Location: [facility] [place];  Service: Vascular;  Laterality: Left;  . AV FISTULA PLACEMENT Right 08/07/2018   Procedure: Creation of right arm brachiocephalic Fistula;  Surgeon: Waynetta Sandy, MD;  Location: [facility];  Service: Vascular;  Laterality: Right;  . BASCILIC VEIN TRANSPOSITION Left 06/24/2015   Procedure: BASILIC VEIN TRANSPOSITION;  Surgeon: Mal Misty, MD;  Location: [facility];  Service: Vascular;   Laterality: Left;  . BIOPSY  03/14/2019   Procedure: BIOPSY;  Surgeon: Irene Shipper, MD;  Location: [facility];  Service: Endoscopy;;  . CATARACT EXTRACTION, BILATERAL Bilateral   . COLONOSCOPY WITH PROPOFOL N/A 03/14/2019   Procedure: COLONOSCOPY WITH PROPOFOL;  Surgeon: Irene Shipper, MD;  Location: [facility] ENDOSCOPY;  Service: Endoscopy;  Laterality: N/A;  . ESOPHAGOGASTRODUODENOSCOPY (EGD) WITH PROPOFOL N/A 05/05/2014   Procedure: ESOPHAGOGASTRODUODENOSCOPY (EGD) WITH PROPOFOL;  Surgeon: Missy Sabins, MD;  Location: [facility] ENDOSCOPY;  Service: Endoscopy;  Laterality: N/A;  . EYE SURGERY Bilateral    cataract surgery   . GLAUCOMA SURGERY Bilateral   . INSERTION OF ARTERIOVENOUS (AV) ARTEGRAFT ARM  10/09/2018   Procedure: INSERTION OF ARTERIOVENOUS (AV) 64mm x 41cm ARTEGRAFT LEFT UPPER ARM;  Surgeon: Waynetta Sandy, MD;  Location: [facility];  Service: Vascular;;  . INSERTION OF DIALYSIS CATHETER Right 07/14/2019   Procedure: Insertion Of Dialysis Catheter;  Surgeon: Elam Dutch, MD;  Location: [facility] OR;  Service: Vascular;  Laterality: Right;  placed right Internal Jugular  . INSERTION PROSTATE RADIATION SEED    .  IR FLUORO GUIDE CV LINE RIGHT  02/18/2018  . IR REMOVAL TUN CV CATH W/O FL  12/06/2018  . IR US GUIDE VASC ACCESS RIGHT  02/18/2018  . LEFT HEART CATH AND CORONARY ANGIOGRAPHY N/A 02/20/2018   Procedure: LEFT HEART CATH AND CORONARY ANGIOGRAPHY;  Surgeon: Jettie Booze, MD;  Location: [facility] [facility] CV LAB;;; 50% ostial OM2, 25% mLAD.  Marland Kitchen LEFT HEART CATH AND CORONARY ANGIOGRAPHY  2004   mildly depressed LV systolic fx EF 17%,EYCXKG coronaries/abdominal aorta/renal arteries.  Marland Kitchen NM MYOCAR PERF WALL MOTION  02/21/2010   No ischemia or infarction.  EF 27%.  Marland Kitchen RADIOACTIVE SEED IMPLANT  2010   prostate cancer  . REVISION OF ARTERIOVENOUS GORETEX GRAFT Right 07/11/2019   Procedure: REVISION OF ARTERIOVENOUS GORETEX GRAFT RIGHT ARM WITH ARTEGRAFT;  Surgeon: Serafina Mitchell, MD;  Location:  [facility];  Service: Vascular;  Laterality: Right;  . SHUNTOGRAM Right 07/14/2019   Procedure: Shuntogram;  Surgeon: Elam Dutch, MD;  Location: [place] [place];  Service: Vascular;  Laterality: Right;  . THROMBECTOMY AND REVISION OF ARTERIOVENTOUS (AV) GORETEX  GRAFT Right 07/14/2019   Procedure: THROMBECTOMY AND REVISION OF ARTERIOVENTOUS (AV) GORETEX  GRAFT;  Surgeon: Elam Dutch, MD;  Location: [place];  Service: Vascular;  Laterality: Right;  . THROMBECTOMY W/ EMBOLECTOMY Left 02/26/2018   Procedure: THROMBECTOMY ARTERIOVENOUS GRAFT;  Surgeon: Angelia Mould, MD;  Location: [facility];  Service: Vascular;  Laterality: Left;  . TRANSTHORACIC ECHOCARDIOGRAM  02/2012   [facility]) EF 45-50% with mild global HK.  Mild LVH,LA mod. dilated,mild-mod. 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 AI.  Marland Kitchen TRANSTHORACIC ECHOCARDIOGRAM  9/'18; 3/'19   a) Severely reduced EF.  25 from 30%.  GR 1 DD with diffuse ecchymosis. Mild AS & AI.  Mild LA/RA dilation; b) (post PEA arest):   Moderately dilated LV with moderate concentric virtually.  EF 15 to 20%.  GR 1 DD.  Paradoxical septal motion. Mild AI.  Mild LA dilation.  Poorly visualized RV.  Marland Kitchen TRANSTHORACIC ECHOCARDIOGRAM  02/2019   EF 20-25%.  Unable to assess diastolic function (A. Fib).  No LV apical thrombus.  Mild AI.  Mildly reduced RV function, however poorly visualized.  Marland Kitchen ULTRASOUND GUIDANCE FOR VASCULAR ACCESS  02/20/2018   Procedure: Ultrasound Guidance For Vascular Access;  Surgeon: Jettie Booze, MD;  Location: [facility] CV LAB;  Service: Cardiovascular;;  . UPPER EXTREMITY VENOGRAPHY N/A 07/08/2018   Procedure: UPPER EXTREMITY VENOGRAPHY;  Surgeon: Waynetta Sandy, MD;  Location: [facility] CV LAB;  Service: Cardiovascular;  Laterality: N/A;  Bilateral        Home Medications    Prior to Admission medications   Medication Sig Start Date End Date Taking? Authorizing Provider  acetaminophen (TYLENOL) 500 MG  tablet Take 1,000 mg by mouth every 6 (six) hours as needed for moderate pain.   Yes [provider]  allopurinol (ZYLOPRIM) 100 MG tablet 1 tablet by mouth 3x weekly after dialysis. Patient taking differently: Take 100 mg by mouth daily at 2 PM.  06/20/19  Yes [name], NP  amiodarone (PACERONE) 200 MG tablet Take 1 tablet (200 mg total) by mouth daily. 05/12/19  Yes Croitoru, Mihai, MD  amoxicillin (AMOXIL) 500 MG capsule Take QDAILY in the am, then on dialysis days take ANOTHER dose AFTER HD Patient taking differently: Take 500-1,000 mg by mouth See admin instructions. Take 1 capsule every day and take 2 capsules on days of dialysis 04/02/19  Yes Tommy Medal, Lavell Islam, MD  bictegravir-emtricitabine-tenofovir AF ([name]) 50-200-25 MG TABS tablet Take 1 tablet by mouth daily. 09/05/19  Yes Tommy Medal, Lavell Islam, MD  brimonidine (ALPHAGAN) 0.15 % ophthalmic solution Place 1 drop into the left eye 2 (two) times daily. 11/15/17  Yes [provider]  dorzolamide-timolol (COSOPT) 22.3-6.8 MG/ML ophthalmic solution Place 1 drop into the left eye 2 (two) times daily. 12/17/17  Yes [provider]  ferric citrate (AURYXIA) 1 GM 210 MG(Fe) tablet Take 420 mg by mouth 3 (David) times daily with meals.   Yes [provider]  latanoprost (XALATAN) 0.005 % ophthalmic solution Place 1 drop into the left eye at bedtime.    Yes [provider]  loratadine (CLARITIN) 10 MG tablet Take 10 mg by mouth daily.   Yes [provider]  midodrine (PROAMATINE) 10 MG tablet Take 1 tablet (10 mg total) by mouth as directed. Twice a day on the day of HD Tuesday, Thursday, Saturday (1 in AM and 1 at [place]). Take 1/2 tablet (5mg ) a day on non HD days Patient taking differently: Take 10 mg by mouth See admin instructions. Take 1 tablet (10 mg) by mouth 3 times daily on Tuesdays, Thursdays, & Saturdays. 04/22/19  Yes Leonie Man, MD  multivitamin (RENA-VIT) TABS tablet  Take 1 tablet by mouth daily at 2 PM.    Yes [provider]  warfarin (COUMADIN) 5 MG tablet Take 1/2 to 1 tablet by mouth daily as directed by coumadin clinic Patient taking differently: Take 2.5-5 mg by mouth See admin instructions. Take 1/2 tablet on Monday and Friday then take 1 tablet all the other days 07/25/19  Yes Leonie Man, MD    Family History Family History  Problem Relation Age of Onset  . Hypertension Mother   . Thyroid disease Mother   . Cholelithiasis Daughter   . Cholelithiasis Son   . Hypertension Maternal Grandmother   . Diabetes Maternal Grandmother   . Heart attack Neg Hx   . Hyperlipidemia Neg Hx     Social History Social History   Tobacco Use  . Smoking status: Former Smoker    Packs/day: 1.00    Years: 30.00    Pack years: 30.00    Types: Cigarettes    Quit date: 2006    Years since quitting: 14.7  . Smokeless tobacco: Never Used  Substance Use Topics  . Alcohol use: Yes    Alcohol/week: 1.0 standard drinks    Types: 1 Shots of liquor per week  . Drug use: Never     Allergies   Dextromethorphan-guaifenesin, Tocotrienols, and Losartan potassium   Review of Systems Review of Systems  Constitutional: Negative for fever.  HENT: Positive for congestion and nosebleeds.   Cardiovascular: Negative for syncope.  Ten systems reviewed and are negative for acute change, except as noted in the HPI.    Physical Exam Updated Vital Signs BP 122/70 (BP Location: Left Arm)   Pulse 72   Temp (!) 97.3 F (36.3 C) (Temporal)   Resp 18   Wt 83 kg   SpO2 99%   BMI 23.49 kg/m   Physical Exam Vitals signs and nursing note reviewed.  Constitutional:      General: He is not in acute distress.    Appearance: He is well-developed. He is not diaphoretic.     Comments: Nontoxic appearing and in NAD  HENT:     Head: Normocephalic and atraumatic.     Nose:  Comments: Macerated and edematous medial left nare without septal deviation or  hematoma. Scant bleeding. No clots. Right nare is normal.    Mouth/Throat:     Comments: Clear posterior oropharynx. Tolerating secretions. Eyes:     General: No scleral icterus.    Conjunctiva/sclera: Conjunctivae normal.  Neck:     Musculoskeletal: Normal range of motion.  Cardiovascular:     Rate and Rhythm: Normal rate and regular rhythm.     Pulses: Normal pulses.  Pulmonary:     Effort: Pulmonary effort is normal. No respiratory distress.     Comments: Respirations even and unlabored Musculoskeletal: Normal range of motion.  Skin:    General: Skin is warm and dry.     Coloration: Skin is not pale.     Findings: No erythema or rash.  Neurological:     Mental Status: He is alert and oriented to person, place, and time.     Coordination: Coordination normal.  Psychiatric:        Behavior: Behavior normal.      ED Treatments / Results  Labs (all labs ordered are listed, but only abnormal results are displayed) Labs Reviewed  CBC - Abnormal; Notable for the following components:      Result Value   RBC 2.82 (*)    Hemoglobin 10.8 (*)    HCT 32.3 (*)    MCV 114.5 (*)    MCH 38.3 (*)    All other components within normal limits  PROTIME-INR - Abnormal; Notable for the following components:   Prothrombin Time 32.3 (*)    INR 3.2 (*)    All other components within normal limits    EKG None  Radiology No results found.  Procedures .Epistaxis Management  Date/Time: 09/08/2019 3:51 AM Performed by: Antonietta Breach, PA-C Authorized by: Antonietta Breach, PA-C   Consent:    Consent obtained:  Verbal   Consent given by:  Patient   Risks discussed:  Pain   Alternatives discussed:  No treatment and observation Anesthesia (see MAR for exact dosages):    Anesthesia method:  None Procedure details:    Treatment site:  L anterior   Repair method: 5.5 Rapid Rhino.   Treatment complexity:  Limited   Treatment episode: recurring   Post-procedure details:    Assessment:   Bleeding stopped   Patient tolerance of procedure:  Tolerated well, no immediate complications   (including critical care time)  Medications Ordered in ED Medications  oxymetazoline (AFRIN) 0.05 % nasal spray 1 spray (has no administration in time range)    1:45 AM Patient reassessed. Soaked cotton ball without significant bleeding. This was removed; small clot noted on end of cotton ball. Left nare inspected without signs of active bleeding. Will continue to observe for signs of recurrent epistaxis.  2:56 AM No evidence of repeat bleeding after 1 hour observation.   Initial Impression / Assessment and Plan / ED Course  I have reviewed the triage vital signs and the nursing notes.  Pertinent labs & imaging results that were available during my care of the patient were reviewed by me and considered in my medical decision making (see chart for details).        78 year old male presents to the emergency department for evaluation of left-sided epistaxis which began approximately 1 hour prior to arrival.  Remote history of facial trauma.  No septal deviation or hematoma.  Bleeding was controlled after being packed with cotton ball soaked in Afrin.  He has had no  evidence of repeat bleeding after 1 hour observation.  Hemoglobin stable and INR therapeutic.  Patient discharged with instruction to follow-up with his primary care doctor.  Return precautions discussed and provided. Patient discharged in stable condition with no unaddressed concerns.  3:49 AM RN went to discharge patient and found that bleeding had restarted in the left nare.  A 5.5 rapid Rhino was placed in the left nare without complications. Will continue to monitor to ensure no larger packing indicated. If stable, will proceed with discharge as planned; will require ENT follow up within 3 days for packing removal.  4:08 AM Hemostasis achieved. Discharged in stable condition.   Final Clinical Impressions(s) / ED Diagnoses    Final diagnoses:  Left-sided epistaxis    ED Discharge Orders    None       Antonietta Breach, PA-C 09/08/19 0320    Antonietta Breach, PA-C 09/08/19 0408    Orpah Greek, MD 09/08/19 [phone]

## 2019-09-08 NOTE — ED Triage Notes (Signed)
Pt transported from home by EMS with active nosebleed, both nares x 1 hour. Pt does take Warfarin, pt has maxillary fx from fall 09/05/19.

## 2019-09-08 NOTE — Progress Notes (Signed)
This encounter was created in error - please disregard.

## 2019-09-08 NOTE — ED Notes (Signed)
Pt attempting to call his ride at this time

## 2019-09-08 NOTE — ED Notes (Signed)
Pt and wife given additional follow up for ENT in 3 days for packing removal

## 2019-09-08 NOTE — Telephone Encounter (Signed)
LVM to call office and set up appt.

## 2019-09-08 NOTE — ED Notes (Signed)
No further bleeding noted at this time

## 2019-09-09 DIAGNOSIS — Z992 Dependence on renal dialysis: Secondary | ICD-10-CM | POA: Diagnosis not present

## 2019-09-09 DIAGNOSIS — N186 End stage renal disease: Secondary | ICD-10-CM | POA: Diagnosis not present

## 2019-09-09 DIAGNOSIS — N2581 Secondary hyperparathyroidism of renal origin: Secondary | ICD-10-CM | POA: Diagnosis not present

## 2019-09-09 NOTE — Telephone Encounter (Signed)
LVM for pt to call office

## 2019-09-10 ENCOUNTER — Other Ambulatory Visit: Payer: Self-pay

## 2019-09-10 DIAGNOSIS — I4891 Unspecified atrial fibrillation: Secondary | ICD-10-CM | POA: Diagnosis not present

## 2019-09-10 DIAGNOSIS — Z87891 Personal history of nicotine dependence: Secondary | ICD-10-CM | POA: Diagnosis not present

## 2019-09-10 DIAGNOSIS — S022XXA Fracture of nasal bones, initial encounter for closed fracture: Secondary | ICD-10-CM | POA: Diagnosis not present

## 2019-09-10 DIAGNOSIS — Z7901 Long term (current) use of anticoagulants: Secondary | ICD-10-CM | POA: Diagnosis not present

## 2019-09-10 NOTE — Patient Outreach (Signed)
[facility]) Care Management  [place]  09/10/2019   [facility] [birth year] [id number]  Initial Assessment   Outreach attempt #1 to patient. Spoke with patient who reported he was waiting at "nose MD" office to be seen. He denies any acute issues or concerns. He reports that this have been going well for him since returning home from hospital last week. He went to dialysis treatment on yesterday and voices no issues with treatment. He denies any pain at present.   Conditions: Patient has PMH of ESRD(HD-Tues, Thurs, Sat), HIV, CHF EF 20-25%, HLD, CVA, prostate CA, A-fib, Gout, Anemia and CAD.  Medications: Med review completed with patient. He denies any issues managing and/or affording meds at this time. He is getting patient assistance for his antiviral med.   Encounter Medications:  Outpatient Encounter Medications as of 09/10/2019  Medication Sig Note  . acetaminophen (TYLENOL) 500 MG tablet Take 1,000 mg by mouth every 6 (six) hours as needed for moderate pain.   Marland Kitchen amiodarone (PACERONE) 200 MG tablet Take 1 tablet (200 mg total) by mouth daily.   Marland Kitchen amoxicillin (AMOXIL) 500 MG capsule Take QDAILY in the am, then on dialysis days take ANOTHER dose AFTER HD (Patient taking differently: Take 500-1,000 mg by mouth See admin instructions. Take 1 capsule every day and take 2 capsules on days of dialysis) 07/09/2019: Continuously until teeth are removed  . bictegravir-emtricitabine-tenofovir AF ([name]) 50-200-25 MG TABS tablet Take 1 tablet by mouth daily.   . brimonidine (ALPHAGAN) 0.15 % ophthalmic solution Place 1 drop into the left eye 2 (two) times daily.   . dorzolamide-timolol (COSOPT) 22.3-6.8 MG/ML ophthalmic solution Place 1 drop into the left eye 2 (two) times daily.   . ferric citrate (AURYXIA) 1 GM 210 MG(Fe) tablet Take 420 mg by mouth 3 (three) times daily with meals.   . latanoprost (XALATAN) 0.005 % ophthalmic solution Place 1 drop into the  left eye at bedtime.    Marland Kitchen loratadine (CLARITIN) 10 MG tablet Take 10 mg by mouth daily.   . midodrine (PROAMATINE) 10 MG tablet Take 1 tablet (10 mg total) by mouth as directed. Twice a day on the day of HD Tuesday, Thursday, Saturday (1 in AM and 1 at [place]). Take 1/2 tablet (5mg ) a day on non HD days (Patient taking differently: Take 10 mg by mouth See admin instructions. Take 1 tablet (10 mg) by mouth 3 times daily on Tuesdays, Thursdays, & Saturdays.)   . multivitamin (RENA-VIT) TABS tablet Take 1 tablet by mouth daily at 2 PM.    . warfarin (COUMADIN) 5 MG tablet Take 1/2 to 1 tablet by mouth daily as directed by coumadin clinic (Patient taking differently: Take 2.5-5 mg by mouth See admin instructions. Take 1/2 tablet on Monday and Friday then take 1 tablet all the other days)   . allopurinol (ZYLOPRIM) 100 MG tablet 1 tablet by mouth 3x weekly after dialysis. (Patient not taking: Reported on 09/10/2019)    No facility-administered encounter medications on file as of 09/10/2019.     Functional Status:  In your present state of health, do you have any difficulty performing the following activities: 09/10/2019 09/03/2019  Hearing? N -  Vision? N -  Difficulty concentrating or making decisions? N -  Walking or climbing stairs? N -  Dressing or bathing? N -  Doing errands, shopping? N N  Preparing Food and eating ? N -  Using the Toilet? N -  In the past  six months, have you accidently leaked urine? N -  Do you have problems with loss of bowel control? N -  Managing your Medications? N -  Managing your Finances? N -  Housekeeping or managing your Housekeeping? N -  Some recent data might be hidden    Fall/Depression Screening: Fall Risk  09/04/2019 04/02/2019 01/17/2019  Falls in the past year? 1 0 0  Number falls in past yr: 1 0 -  Injury with Fall? 0 0 -  Risk for fall due to : History of fall(s);Impaired balance/gait;Impaired mobility;Impaired vision - -  Follow up Education  provided;Falls evaluation completed - -   PHQ 2/9 Scores 04/02/2019 01/17/2019 07/03/2018 04/24/2018 02/25/2018 12/31/2017 10/03/2017  PHQ - 2 Score 0 0 0 0 0 0 0    Assessment:  THN CM Care Plan Problem One     Most Recent Value  Care Plan Problem One  Knowledge deficit related to ESRD diasease process and mgmt.  Role Documenting the Problem One  Care Management Telephonic Coordinator  Care Plan for Problem One  Active  [facility] Long Term Goal   Patient will report 100% adherence to dialysis schedule and treatments over the next 90 days.   THN Long Term Goal Start Date  09/10/19  Interventions for Problem One Long Term Goal  RN CM educated pt. on importance of adhering to dialysis treatments. RN CM confirmed that pt. has reliable transportation to appts.  THN CM Short Term Goal #1   Patient will be able to verbalize at least 3-4 healthy diet foods/meals over the next 30days.  THN CM Short Term Goal #1 Start Date  09/10/19  Interventions for Short Term Goal #1  RN CM educated pt. on renal diet and restrictions.   THN CM Short Term Goal #2   Patient will be able to verbalize at least two ways to manage fluid retention over the next 30 days.  THN CM Short Term Goal #2 Start Date  09/10/19  Interventions for Short Term Goal #2  RN CM educated pt. on ways to maintain weight and avoid fluid overload.      Plan:  RN CM will make outreach attempt to patient within one month. RN CM will send barriers letter and route encounter to PCP.  Enzo Montgomery, RN,BSN,CCM [place] [facility] Telephonic Care Management Coordinator Direct Phone: [phone] Toll Free: [phone] Fax: [phone]

## 2019-09-11 ENCOUNTER — Ambulatory Visit: Payer: Self-pay

## 2019-09-11 DIAGNOSIS — N186 End stage renal disease: Secondary | ICD-10-CM | POA: Diagnosis not present

## 2019-09-11 DIAGNOSIS — Z992 Dependence on renal dialysis: Secondary | ICD-10-CM | POA: Diagnosis not present

## 2019-09-11 DIAGNOSIS — N2581 Secondary hyperparathyroidism of renal origin: Secondary | ICD-10-CM | POA: Diagnosis not present

## 2019-09-11 LAB — POCT INR: INR: 2.8 (ref 2.0–3.0)

## 2019-09-12 ENCOUNTER — Other Ambulatory Visit: Payer: Self-pay

## 2019-09-12 ENCOUNTER — Encounter: Payer: Self-pay | Admitting: Family

## 2019-09-12 ENCOUNTER — Ambulatory Visit (INDEPENDENT_AMBULATORY_CARE_PROVIDER_SITE_OTHER): Payer: Medicare HMO | Admitting: Family

## 2019-09-12 VITALS — BP 111/74 | HR 44 | Temp 96.8°F | Resp 16 | Ht 74.0 in | Wt 185.0 lb

## 2019-09-12 DIAGNOSIS — R040 Epistaxis: Secondary | ICD-10-CM | POA: Diagnosis not present

## 2019-09-12 DIAGNOSIS — I480 Paroxysmal atrial fibrillation: Secondary | ICD-10-CM

## 2019-09-12 DIAGNOSIS — J309 Allergic rhinitis, unspecified: Secondary | ICD-10-CM

## 2019-09-12 DIAGNOSIS — Z992 Dependence on renal dialysis: Secondary | ICD-10-CM | POA: Diagnosis not present

## 2019-09-12 DIAGNOSIS — N186 End stage renal disease: Secondary | ICD-10-CM | POA: Diagnosis not present

## 2019-09-12 DIAGNOSIS — S022XXD Fracture of nasal bones, subsequent encounter for fracture with routine healing: Secondary | ICD-10-CM | POA: Diagnosis not present

## 2019-09-12 MED ORDER — MONTELUKAST SODIUM 10 MG PO TABS: 10.0000 mg | ORAL_TABLET | Freq: Every day | ORAL | 3 refills | Status: DC

## 2019-09-12 NOTE — Progress Notes (Addendum)
Subjective:    Patient ID: David [name], male    DOB: 1941-08-25, 78 y.o.   MRN: [id number]  HPI  Patient is a 78 yr old male who presents today for hospital follow up. Hospital discharge summary is reviewed.    Pt had fall on 10/2 following a fall. He apparently tripped and fell , landing on the sidewalk. Sustained a lip laceration and hit his forehead.  Had no reported LOC. CT head was performed and was negative for acute bleed. Note was made of a mildly displaced nasal bone fracture and a scalp hematoma.    On 10/5, he returned to the ED with epistaxis. He was treated with afrin. Bleeding resolved.  He continues to follow with the coumadin clinic and last INR was performed on 09/08/19.  INR was 3.2. He states that he skipped a few doses after his fall.   He saw Dr. Erik Obey (ENT) for consult on  09/10/19 for his nasal fracture. No surgery is planned.    ESRD- He has HD on Tuesday/Thursday and Saturday Wt Readings from Last 3 Encounters:  09/12/19 185 lb (83.9 kg)  09/08/19 182 lb 15.7 oz (83 kg)  09/05/19 184 lb (83.5 kg)   Patient has had his flu shot .   Patient reports that his last does of coumadin was about 2 weeks since he took his last dose of coumadin.   He complains of runny nose despite use of claritin.  Previously tried flonase and states it did not help.   Review of Systems  See HPI  Past Medical History:  Diagnosis Date  . 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 Acute on chronic systolic and diastolic heart failure, NYHA class 4 ([place])   . Anemia, iron deficiency 11/15/2011  . Arthritis    "hands, right knee, feet" (02/21/2018)  . Atrial fibrillation ([place])   . Cancer [facility])    hx of prostate; s/p radioactive seed implant 10/2009 Dr Janice Norrie  . Cardiac arrest ([place]) 02/17/2018  . CHF (congestive heart failure) ([place])   . Chronic combined systolic and diastolic heart failure, NYHA class 3 ([place]) 06/2010   felt to be secondary to hypertensive cardiomyopathy  . Chronic  lower back pain   . CKD (chronic kidney disease) stage V requiring chronic dialysis ([place])   . ESRD on dialysis [facility])    started 02/2018 [place]  . GERD (gastroesophageal reflux disease)   . Glaucoma    "I had surgery and dont have it any more"  . Gout    daily RX (02/21/2018)  . Heart murmur    "mild" per pt  . Hepatitis    years ago  . HIV (human immunodeficiency virus infection) ([place])   . HIV infection ([place])   . Hyperlipidemia   . Hypertension    followed by [name] (Dr Dani Gobble [place])  . Hypertension   . Nonischemic cardiomyopathy ([place])   . Pneumonia 11/2017  . Prostate cancer ([place])   . Sinus bradycardia   . Sleep apnea    does not use a cpap  . Stroke [facility])    "mini stroke" years ago     Social History   Socioeconomic History  . Marital status: Married    Spouse name: Not on file  . Number of children: 2  . Years of education: 59  . Highest education level: Not on file  Occupational History  . Occupation: retired, picks up Aeronautical engineer: RETIRED  Social Needs  . Financial resource strain: Not on file  . Food insecurity    Worry: Never true    Inability: Never true  . Transportation needs    Medical: No    Non-medical: No  Tobacco Use  . Smoking status: Former Smoker    Packs/day: 1.00    Years: 30.00    Pack years: 30.00    Types: Cigarettes    Quit date: 2006    Years since quitting: 14.7  . Smokeless tobacco: Never Used  Substance and Sexual Activity  . Alcohol use: Yes    Alcohol/week: 1.0 standard drinks    Types: 1 Shots of liquor per week  . Drug use: Never  . Sexual activity: Not Currently  Lifestyle  . Physical activity    Days per week: Not on file    Minutes per session: Not on file  . Stress: Not on file  Relationships  . Social Herbalist on phone: Not on file    Gets together: Not on file    Attends religious service: Not on file    Active member of club  or organization: Not on file    Attends meetings of clubs or organizations: Not on file    Relationship status: Not on file  . Intimate partner violence    Fear of current or ex partner: Not on file    Emotionally abused: Not on file    Physically abused: Not on file    Forced sexual activity: Not on file  Other Topics Concern  . Not on file  Social History Narrative   ** Merged History Encounter **       Stays active at home Regular exercise: no Drinks 2 cups of coffee a week, 1 mountain dew soda a day.    Past Surgical History:  Procedure Laterality Date  . AV FISTULA PLACEMENT Right 10/13/2016   Procedure: ARTERIOVENOUS (AV) FISTULA CREATION;  Surgeon: [name], MD;  Location: [place];  Service: Vascular;  Laterality: Right;  . AV FISTULA PLACEMENT Left 02/25/2018   Procedure: INSERTION OF ARTERIOVENOUS (AV) GORE-TEX GRAFT LEFT UPPER ARM;  Surgeon: Angelia Mould, MD;  Location: [place];  Service: Vascular;  Laterality: Left;  . AV FISTULA PLACEMENT Right 08/07/2018   Procedure: Creation of right arm brachiocephalic Fistula;  Surgeon: Waynetta Sandy, MD;  Location: [place];  Service: Vascular;  Laterality: Right;  . BASCILIC VEIN TRANSPOSITION Left 06/24/2015   Procedure: BASILIC VEIN TRANSPOSITION;  Surgeon: Mal Misty, MD;  Location: [place];  Service: Vascular;  Laterality: Left;  . BIOPSY  03/14/2019   Procedure: BIOPSY;  Surgeon: Irene Shipper, MD;  Location: [place];  Service: Endoscopy;;  . CATARACT EXTRACTION, BILATERAL Bilateral   . COLONOSCOPY WITH PROPOFOL N/A 03/14/2019   Procedure: COLONOSCOPY WITH PROPOFOL;  Surgeon: Irene Shipper, MD;  Location: [facility] ENDOSCOPY;  Service: Endoscopy;  Laterality: N/A;  . ESOPHAGOGASTRODUODENOSCOPY (EGD) WITH PROPOFOL N/A 05/05/2014   Procedure: ESOPHAGOGASTRODUODENOSCOPY (EGD) WITH PROPOFOL;  Surgeon: Missy Sabins, MD;  Location: [facility] ENDOSCOPY;  Service: Endoscopy;  Laterality: N/A;  . EYE SURGERY Bilateral    cataract  surgery   . GLAUCOMA SURGERY Bilateral   . INSERTION OF ARTERIOVENOUS (AV) ARTEGRAFT ARM  10/09/2018   Procedure: INSERTION OF ARTERIOVENOUS (AV) 33mm x 41cm ARTEGRAFT LEFT UPPER ARM;  Surgeon: Waynetta Sandy, MD;  Location: [place];  Service: Vascular;;  . INSERTION OF DIALYSIS CATHETER Right 07/14/2019  Procedure: Insertion Of Dialysis Catheter;  Surgeon: Elam Dutch, MD;  Location: [facility] OR;  Service: Vascular;  Laterality: Right;  placed right Internal Jugular  . INSERTION PROSTATE RADIATION SEED    . IR FLUORO GUIDE CV LINE RIGHT  02/18/2018  . IR REMOVAL TUN CV CATH W/O FL  12/06/2018  . IR US GUIDE VASC ACCESS RIGHT  02/18/2018  . LEFT HEART CATH AND CORONARY ANGIOGRAPHY N/A 02/20/2018   Procedure: LEFT HEART CATH AND CORONARY ANGIOGRAPHY;  Surgeon: Jettie Booze, MD;  Location: [place] CV LAB;;; 50% ostial OM2, 25% mLAD.  Marland Kitchen LEFT HEART CATH AND CORONARY ANGIOGRAPHY  2004   mildly depressed LV systolic fx EF 94%,BSJGGE coronaries/abdominal aorta/renal arteries.  Marland Kitchen NM MYOCAR PERF WALL MOTION  02/21/2010   No ischemia or infarction.  EF 27%.  Marland Kitchen RADIOACTIVE SEED IMPLANT  2010   prostate cancer  . REVISION OF ARTERIOVENOUS GORETEX GRAFT Right 07/11/2019   Procedure: REVISION OF ARTERIOVENOUS GORETEX GRAFT RIGHT ARM WITH ARTEGRAFT;  Surgeon: Serafina Mitchell, MD;  Location: [place];  Service: Vascular;  Laterality: Right;  . SHUNTOGRAM Right 07/14/2019   Procedure: Shuntogram;  Surgeon: Elam Dutch, MD;  Location: [place];  Service: Vascular;  Laterality: Right;  . THROMBECTOMY AND REVISION OF ARTERIOVENTOUS (AV) GORETEX  GRAFT Right 07/14/2019   Procedure: THROMBECTOMY AND REVISION OF ARTERIOVENTOUS (AV) GORETEX  GRAFT;  Surgeon: Elam Dutch, MD;  Location: [place];  Service: Vascular;  Laterality: Right;  . THROMBECTOMY W/ EMBOLECTOMY Left 02/26/2018   Procedure: THROMBECTOMY ARTERIOVENOUS GRAFT;  Surgeon: Angelia Mould, MD;  Location: [place];  Service: Vascular;   Laterality: Left;  . TRANSTHORACIC ECHOCARDIOGRAM  02/2012   [facility]) EF 45-50% with mild global HK.  Mild LVH,LA mod. dilated,mild-mod. 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 AI.  Marland Kitchen TRANSTHORACIC ECHOCARDIOGRAM  9/'18; 3/'19   a) Severely reduced EF.  25 from 30%.  GR 1 DD with diffuse ecchymosis. Mild AS & AI.  Mild LA/RA dilation; b) (post PEA arest):   Moderately dilated LV with moderate concentric virtually.  EF 15 to 20%.  GR 1 DD.  Paradoxical septal motion. Mild AI.  Mild LA dilation.  Poorly visualized RV.  Marland Kitchen TRANSTHORACIC ECHOCARDIOGRAM  02/2019   EF 20-25%.  Unable to assess diastolic function (A. Fib).  No LV apical thrombus.  Mild AI.  Mildly reduced RV function, however poorly visualized.  Marland Kitchen ULTRASOUND GUIDANCE FOR VASCULAR ACCESS  02/20/2018   Procedure: Ultrasound Guidance For Vascular Access;  Surgeon: Jettie Booze, MD;  Location: [place] CV LAB;  Service: Cardiovascular;;  . UPPER EXTREMITY VENOGRAPHY N/A 07/08/2018   Procedure: UPPER EXTREMITY VENOGRAPHY;  Surgeon: Waynetta Sandy, MD;  Location: [place] CV LAB;  Service: Cardiovascular;  Laterality: N/A;  Bilateral    Family History  Problem Relation Age of Onset  . Hypertension Mother   . Thyroid disease Mother   . Cholelithiasis Daughter   . Cholelithiasis Son   . Hypertension Maternal Grandmother   . Diabetes Maternal Grandmother   . Heart attack Neg Hx   . Hyperlipidemia Neg Hx     Allergies  Allergen Reactions  . Dextromethorphan-Guaifenesin     Other reaction(s): Unknown  . Tocotrienols     Other reaction(s): Unknown  . Losartan Potassium Other (See Comments)    Causes constipation  Other reaction(s): Unknown    Current Outpatient Medications on File Prior to Visit  Medication Sig Dispense Refill  .  acetaminophen (TYLENOL) 500 MG tablet Take 1,000 mg by mouth every 6 (six) hours as needed for moderate pain.    Marland Kitchen allopurinol (ZYLOPRIM) 100 MG tablet 1  tablet by mouth 3x weekly after dialysis. 90 tablet 1  . amiodarone (PACERONE) 200 MG tablet Take 1 tablet (200 mg total) by mouth daily. 90 tablet 3  . amoxicillin (AMOXIL) 500 MG capsule Take QDAILY in the am, then on dialysis days take ANOTHER dose AFTER HD (Patient taking differently: Take 500-1,000 mg by mouth See admin instructions. Take 1 capsule every day and take 2 capsules on days of dialysis) 60 capsule 5  . bictegravir-emtricitabine-tenofovir AF ([name]) 50-200-25 MG TABS tablet Take 1 tablet by mouth daily. 30 tablet 5  . brimonidine (ALPHAGAN) 0.15 % ophthalmic solution Place 1 drop into the left eye 2 (two) times daily.  3  . dorzolamide-timolol (COSOPT) 22.3-6.8 MG/ML ophthalmic solution Place 1 drop into the left eye 2 (two) times daily.    . ferric citrate (AURYXIA) 1 GM 210 MG(Fe) tablet Take 420 mg by mouth 3 (three) times daily with meals.    . latanoprost (XALATAN) 0.005 % ophthalmic solution Place 1 drop into the left eye at bedtime.     Marland Kitchen loratadine (CLARITIN) 10 MG tablet Take 10 mg by mouth daily.    . midodrine (PROAMATINE) 10 MG tablet Take 1 tablet (10 mg total) by mouth as directed. Twice a day on the day of HD Tuesday, Thursday, Saturday (1 in AM and 1 at [place]). Take 1/2 tablet (5mg ) a day on non HD days (Patient taking differently: Take 10 mg by mouth See admin instructions. Take 1 tablet (10 mg) by mouth 3 times daily on Tuesdays, Thursdays, & Saturdays.) 90 tablet 5  . multivitamin (RENA-VIT) TABS tablet Take 1 tablet by mouth daily at 2 PM.     . warfarin (COUMADIN) 5 MG tablet Take 1/2 to 1 tablet by mouth daily as directed by coumadin clinic (Patient taking differently: Take 2.5-5 mg by mouth See admin instructions. Take 1/2 tablet on Monday and Friday then take 1 tablet all the other days) 90 tablet 0   No current facility-administered medications on file prior to visit.     BP 111/74 (BP Location: Left Arm, Patient Position: Sitting, Cuff Size: Small)    Pulse (!) 44   Temp (!) 96.8 F (36 C) (Temporal)   Resp 16   Ht 6\' 2"  (1.88 m)   Wt 185 lb (83.9 kg)   SpO2 100%   BMI 23.75 kg/m       Objective:   Physical Exam Constitutional:      General: He is not in acute distress.    Appearance: He is well-developed.  HENT:     Head: Normocephalic and atraumatic.     Nose:     Comments: No significant nasal swelling Cardiovascular:     Rate and Rhythm: Normal rate and regular rhythm.     Heart sounds: No murmur.  Pulmonary:     Effort: Pulmonary effort is normal. No respiratory distress.     Breath sounds: Normal breath sounds. No wheezing or rales.  Skin:    General: Skin is warm and dry.  Neurological:     Mental Status: He is alert and oriented to person, place, and time.  Psychiatric:        Behavior: Behavior normal.        Thought Content: Thought content normal.  Assessment & Plan:  ESRD- appears euvolemic today.  Management per renal.  AF- advised pt to continue coumadin and follow up with the coumadin clinic. (he states that nephrology faxes his INR to the coumadin clinic).  Nasal bone fracture- clinically stable, no need for surgery per ENT.   Epistaxis- resolved.  Pt is advised to return to ED if recurrent epistaxis.  Allergic rhinitis- uncontrolled. will give trial of singulair.

## 2019-09-12 NOTE — Patient Instructions (Signed)
Please restart coumadin.

## 2019-09-12 NOTE — Addendum Note (Signed)
Addended by: [name] on: 09/12/2019 03:56 PM   Modules accepted: Orders

## 2019-09-13 DIAGNOSIS — N186 End stage renal disease: Secondary | ICD-10-CM | POA: Diagnosis not present

## 2019-09-13 DIAGNOSIS — Z992 Dependence on renal dialysis: Secondary | ICD-10-CM | POA: Diagnosis not present

## 2019-09-13 DIAGNOSIS — N2581 Secondary hyperparathyroidism of renal origin: Secondary | ICD-10-CM | POA: Diagnosis not present

## 2019-09-15 ENCOUNTER — Ambulatory Visit (INDEPENDENT_AMBULATORY_CARE_PROVIDER_SITE_OTHER): Payer: Medicare HMO | Admitting: Cardiology

## 2019-09-15 ENCOUNTER — Encounter: Payer: Self-pay | Admitting: Infectious Disease

## 2019-09-15 ENCOUNTER — Other Ambulatory Visit: Payer: Medicare HMO

## 2019-09-15 ENCOUNTER — Ambulatory Visit: Payer: Medicare HMO

## 2019-09-15 ENCOUNTER — Other Ambulatory Visit: Payer: Self-pay | Admitting: Family

## 2019-09-15 ENCOUNTER — Other Ambulatory Visit: Payer: Self-pay | Admitting: *Deleted

## 2019-09-15 ENCOUNTER — Other Ambulatory Visit: Payer: Self-pay

## 2019-09-15 DIAGNOSIS — Z515 Encounter for palliative care: Secondary | ICD-10-CM

## 2019-09-15 DIAGNOSIS — B20 Human immunodeficiency virus [HIV] disease: Secondary | ICD-10-CM | POA: Diagnosis not present

## 2019-09-15 DIAGNOSIS — I482 Chronic atrial fibrillation, unspecified: Secondary | ICD-10-CM | POA: Diagnosis not present

## 2019-09-15 MED ORDER — ALLOPURINOL 100 MG PO TABS: ORAL_TABLET | ORAL | 1 refills | Status: DC

## 2019-09-15 NOTE — Telephone Encounter (Signed)
**  Prequesting 90 day supply, if possible**  Medication Refill - Medication: allopurinol (ZYLOPRIM) 100 MG tablet   Has the patient contacted their pharmacy? Yes.   (Agent: If no, request that the patient contact the pharmacy for the refill.) (Agent: If yes, when and what did the pharmacy advise?)  Preferred Pharmacy (with phone number or street name): [facility] [place]  Agent: Please be advised that RX refills may take up to 3 business days. We ask that you follow-up with your pharmacy.

## 2019-09-16 DIAGNOSIS — Z992 Dependence on renal dialysis: Secondary | ICD-10-CM | POA: Diagnosis not present

## 2019-09-16 DIAGNOSIS — N186 End stage renal disease: Secondary | ICD-10-CM | POA: Diagnosis not present

## 2019-09-16 DIAGNOSIS — N2581 Secondary hyperparathyroidism of renal origin: Secondary | ICD-10-CM | POA: Diagnosis not present

## 2019-09-16 LAB — T-HELPER CELL (CD4) - (RCID CLINIC ONLY)
CD4 % Helper T Cell: 28 % — ABNORMAL LOW (ref 33–65)
CD4 T Cell Abs: 413 /uL (ref 400–1790)

## 2019-09-17 ENCOUNTER — Encounter (HOSPITAL_COMMUNITY): Payer: Medicare HMO

## 2019-09-17 ENCOUNTER — Ambulatory Visit: Payer: Medicare HMO | Admitting: Vascular Surgery

## 2019-09-17 ENCOUNTER — Other Ambulatory Visit (HOSPITAL_COMMUNITY): Payer: Medicare HMO

## 2019-09-17 DIAGNOSIS — H401133 Primary open-angle glaucoma, bilateral, severe stage: Secondary | ICD-10-CM | POA: Diagnosis not present

## 2019-09-17 DIAGNOSIS — H44422 Hypotony of left eye due to ocular fistula: Secondary | ICD-10-CM | POA: Diagnosis not present

## 2019-09-17 DIAGNOSIS — Z961 Presence of intraocular lens: Secondary | ICD-10-CM | POA: Diagnosis not present

## 2019-09-17 DIAGNOSIS — H04123 Dry eye syndrome of bilateral lacrimal glands: Secondary | ICD-10-CM | POA: Diagnosis not present

## 2019-09-17 LAB — HIV-1 RNA QUANT-NO REFLEX-BLD
HIV 1 RNA Quant: <20 copies/mL
HIV-1 RNA Quant, Log: <1.3 Log copies/mL

## 2019-09-18 DIAGNOSIS — N186 End stage renal disease: Secondary | ICD-10-CM | POA: Diagnosis not present

## 2019-09-18 DIAGNOSIS — Z992 Dependence on renal dialysis: Secondary | ICD-10-CM | POA: Diagnosis not present

## 2019-09-18 DIAGNOSIS — N2581 Secondary hyperparathyroidism of renal origin: Secondary | ICD-10-CM | POA: Diagnosis not present

## 2019-09-20 DIAGNOSIS — Z992 Dependence on renal dialysis: Secondary | ICD-10-CM | POA: Diagnosis not present

## 2019-09-20 DIAGNOSIS — N186 End stage renal disease: Secondary | ICD-10-CM | POA: Diagnosis not present

## 2019-09-20 DIAGNOSIS — N2581 Secondary hyperparathyroidism of renal origin: Secondary | ICD-10-CM | POA: Diagnosis not present

## 2019-09-23 DIAGNOSIS — N186 End stage renal disease: Secondary | ICD-10-CM | POA: Diagnosis not present

## 2019-09-23 DIAGNOSIS — Z992 Dependence on renal dialysis: Secondary | ICD-10-CM | POA: Diagnosis not present

## 2019-09-23 DIAGNOSIS — N2581 Secondary hyperparathyroidism of renal origin: Secondary | ICD-10-CM | POA: Diagnosis not present

## 2019-09-25 DIAGNOSIS — Z992 Dependence on renal dialysis: Secondary | ICD-10-CM | POA: Diagnosis not present

## 2019-09-25 DIAGNOSIS — N2581 Secondary hyperparathyroidism of renal origin: Secondary | ICD-10-CM | POA: Diagnosis not present

## 2019-09-25 DIAGNOSIS — N186 End stage renal disease: Secondary | ICD-10-CM | POA: Diagnosis not present

## 2019-09-25 LAB — POCT INR: INR: 3 (ref 2.0–3.0)

## 2019-09-26 LAB — PROTIME-INR: INR: 3 — AB (ref 0.9–1.1)

## 2019-09-27 DIAGNOSIS — Z992 Dependence on renal dialysis: Secondary | ICD-10-CM | POA: Diagnosis not present

## 2019-09-27 DIAGNOSIS — N2581 Secondary hyperparathyroidism of renal origin: Secondary | ICD-10-CM | POA: Diagnosis not present

## 2019-09-27 DIAGNOSIS — N186 End stage renal disease: Secondary | ICD-10-CM | POA: Diagnosis not present

## 2019-09-29 ENCOUNTER — Other Ambulatory Visit: Payer: Self-pay

## 2019-09-29 ENCOUNTER — Encounter: Payer: Self-pay | Admitting: Infectious Disease

## 2019-09-29 ENCOUNTER — Ambulatory Visit (INDEPENDENT_AMBULATORY_CARE_PROVIDER_SITE_OTHER): Payer: Medicare HMO | Admitting: Infectious Disease

## 2019-09-29 ENCOUNTER — Ambulatory Visit (INDEPENDENT_AMBULATORY_CARE_PROVIDER_SITE_OTHER): Payer: Medicare HMO | Admitting: Cardiology

## 2019-09-29 VITALS — Wt 186.0 lb

## 2019-09-29 DIAGNOSIS — A429 Actinomycosis, unspecified: Secondary | ICD-10-CM | POA: Diagnosis not present

## 2019-09-29 DIAGNOSIS — C61 Malignant neoplasm of prostate: Secondary | ICD-10-CM | POA: Diagnosis not present

## 2019-09-29 DIAGNOSIS — Z515 Encounter for palliative care: Secondary | ICD-10-CM | POA: Diagnosis not present

## 2019-09-29 DIAGNOSIS — Z21 Asymptomatic human immunodeficiency virus [HIV] infection status: Secondary | ICD-10-CM | POA: Diagnosis not present

## 2019-09-29 DIAGNOSIS — B20 Human immunodeficiency virus [HIV] disease: Secondary | ICD-10-CM | POA: Diagnosis not present

## 2019-09-29 DIAGNOSIS — I482 Chronic atrial fibrillation, unspecified: Secondary | ICD-10-CM | POA: Diagnosis not present

## 2019-09-29 DIAGNOSIS — N186 End stage renal disease: Secondary | ICD-10-CM | POA: Diagnosis not present

## 2019-09-29 DIAGNOSIS — I420 Dilated cardiomyopathy: Secondary | ICD-10-CM | POA: Diagnosis not present

## 2019-09-29 DIAGNOSIS — Z992 Dependence on renal dialysis: Secondary | ICD-10-CM

## 2019-09-29 MED ORDER — BIKTARVY 50-200-25 MG PO TABS: 1.0000 | ORAL_TABLET | Freq: Every day | ORAL | 5 refills | Status: DC

## 2019-09-29 NOTE — Progress Notes (Signed)
Chief complaint: Pain at his fracture site of his nose and also his wrist where he may have sprained his wrist  Subjective:    Patient ID: David [name], male    DOB: [birth year], 78 y.o.   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  Due to his comorbid cardiomyopathy and chronic kidney disease we had some concern about ABACAVIR and gentle risk for cardiovascular disease and therefore we changed him to [facility]. He has maintained perfect control on [name] but is complaining of nausea and also lightheadedness. There could because some confounding here given the fact that he was also on terazosin  BPH but he still is having nausea and lightheadedness despite having this medication held. He wanted to be changed back off of the [place] and we changed  him to [facility] and [place].  Now switched over to [place].  His HIV is been perfectly controlled but he had been suffering from other problems to his other comorbid conditions.  He was hospitalized in April abdominal pain and imaged with a CT scan of the pelvis abdomen pelvis that showed some inflammation in the cecum.  He was treated with broad-spectrum antibiotics.  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  With hospice and palliative care to that hospitalization but has remained on hemodialysis.  In the meantime he was readmitted in April 22 with respiratory failure thought to be due to volume overload which resolved with dialysis and optimization of his heart failure.  Blood cultures were drawn on admission when he did have leukocytosis as well he was treated with antibiotics while in-house but then these were discontinued.  His blood cultures have subsequently  shown 1 out of 2 sites to grow and actinomyces species.  He was afebrile without any significant symptoms.  We brought him to the the clinic in April and he did have e fairly poor dentition but no obvious dental abscess.  He did not have a murmur or anything to give [place] a clear-cut suggestion of endocarditis.  That being said I have anxiety about this actinomyces representing a true pathogen and felt be prudent to err on the side of treating him with antibiotics.  The time in particular getting a transesophageal echocardiogram was highly problematic if not impossible to obtain in the outpatient world.  We therefore initiated him on amoxicillin since then.  The interim he did have a fall where he broke his nose and also sustained what sounds like a wrist sprain.  He is due to have vein mapping for new fistula placement by vascular surgery.  He does state that he is exhausted frequently after going for hemodialysis.         Past Medical History:  Diagnosis Date  . 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 Acute on chronic systolic and diastolic heart failure, NYHA class 4 ([place])   . Anemia, iron deficiency 11/15/2011  . Arthritis    "hands, right knee, feet" (02/21/2018)  . Atrial fibrillation ([employer])   . Cancer [facility])    hx of prostate; s/p radioactive seed implant 10/2009 Dr Janice Norrie  . Cardiac arrest ([place]) 02/17/2018  . CHF (congestive heart failure) ([place])   . Chronic combined systolic and  diastolic heart failure, NYHA class 3 ([place]) 06/2010   felt to be secondary to hypertensive cardiomyopathy  . Chronic lower back pain   . CKD (chronic kidney disease) stage V requiring chronic dialysis ([place])   . ESRD on dialysis [facility])    started 02/2018 [place]  . GERD (gastroesophageal reflux disease)   . Glaucoma    "I had surgery and dont have it any more"  . Gout    daily RX (02/21/2018)  . Heart murmur    "mild" per pt  . Hepatitis    years ago  . HIV (human  immunodeficiency virus infection) ([place])   . HIV infection ([place])   . Hyperlipidemia   . Hypertension    followed by [facility] and Vascular (Dr Dani Gobble Croitoru)  . Hypertension   . Nonischemic cardiomyopathy ([place])   . Pneumonia 11/2017  . Prostate cancer ([place])   . Sinus bradycardia   . Sleep apnea    does not use a cpap  . Stroke [facility])    "mini stroke" years ago    Past Surgical History:  Procedure Laterality Date  . AV FISTULA PLACEMENT Right 10/13/2016   Procedure: ARTERIOVENOUS (AV) FISTULA CREATION;  Surgeon: [name], MD;  Location: [place];  Service: Vascular;  Laterality: Right;  . AV FISTULA PLACEMENT Left 02/25/2018   Procedure: INSERTION OF ARTERIOVENOUS (AV) GORE-TEX GRAFT LEFT UPPER ARM;  Surgeon: Angelia Mould, MD;  Location: [place];  Service: Vascular;  Laterality: Left;  . AV FISTULA PLACEMENT Right 08/07/2018   Procedure: Creation of right arm brachiocephalic Fistula;  Surgeon: Waynetta Sandy, MD;  Location: [place];  Service: Vascular;  Laterality: Right;  . BASCILIC VEIN TRANSPOSITION Left 06/24/2015   Procedure: BASILIC VEIN TRANSPOSITION;  Surgeon: Mal Misty, MD;  Location: [place];  Service: Vascular;  Laterality: Left;  . BIOPSY  03/14/2019   Procedure: BIOPSY;  Surgeon: Irene Shipper, MD;  Location: [place];  Service: Endoscopy;;  . CATARACT EXTRACTION, BILATERAL Bilateral   . COLONOSCOPY WITH PROPOFOL N/A 03/14/2019   Procedure: COLONOSCOPY WITH PROPOFOL;  Surgeon: Irene Shipper, MD;  Location: [facility] ENDOSCOPY;  Service: Endoscopy;  Laterality: N/A;  . ESOPHAGOGASTRODUODENOSCOPY (EGD) WITH PROPOFOL N/A 05/05/2014   Procedure: ESOPHAGOGASTRODUODENOSCOPY (EGD) WITH PROPOFOL;  Surgeon: Missy Sabins, MD;  Location: [facility] ENDOSCOPY;  Service: Endoscopy;  Laterality: N/A;  . EYE SURGERY Bilateral    cataract surgery   . GLAUCOMA SURGERY Bilateral   . INSERTION OF ARTERIOVENOUS (AV) ARTEGRAFT ARM  10/09/2018   Procedure: INSERTION OF  ARTERIOVENOUS (AV) 18mm x 41cm ARTEGRAFT LEFT UPPER ARM;  Surgeon: Waynetta Sandy, MD;  Location: [place];  Service: Vascular;;  . INSERTION OF DIALYSIS CATHETER Right 07/14/2019   Procedure: Insertion Of Dialysis Catheter;  Surgeon: Elam Dutch, MD;  Location: [facility] OR;  Service: Vascular;  Laterality: Right;  placed right Internal Jugular  . INSERTION PROSTATE RADIATION SEED    . IR FLUORO GUIDE CV LINE RIGHT  02/18/2018  . IR REMOVAL TUN CV CATH W/O FL  12/06/2018  . IR US GUIDE VASC ACCESS RIGHT  02/18/2018  . LEFT HEART CATH AND CORONARY ANGIOGRAPHY N/A 02/20/2018   Procedure: LEFT HEART CATH AND CORONARY ANGIOGRAPHY;  Surgeon: Jettie Booze, MD;  Location: [place] CV LAB;;; 50% ostial OM2, 25% mLAD.  Marland Kitchen LEFT HEART CATH AND CORONARY ANGIOGRAPHY  2004   mildly depressed LV systolic fx EF 23%,JSEGBT coronaries/abdominal aorta/renal arteries.  Marland Kitchen  NM MYOCAR PERF WALL MOTION  02/21/2010   No ischemia or infarction.  EF 27%.  Marland Kitchen RADIOACTIVE SEED IMPLANT  2010   prostate cancer  . REVISION OF ARTERIOVENOUS GORETEX GRAFT Right 07/11/2019   Procedure: REVISION OF ARTERIOVENOUS GORETEX GRAFT RIGHT ARM WITH ARTEGRAFT;  Surgeon: Serafina Mitchell, MD;  Location: [place];  Service: Vascular;  Laterality: Right;  . SHUNTOGRAM Right 07/14/2019   Procedure: Shuntogram;  Surgeon: Elam Dutch, MD;  Location: [place];  Service: Vascular;  Laterality: Right;  . THROMBECTOMY AND REVISION OF ARTERIOVENTOUS (AV) GORETEX  GRAFT Right 07/14/2019   Procedure: THROMBECTOMY AND REVISION OF ARTERIOVENTOUS (AV) GORETEX  GRAFT;  Surgeon: Elam Dutch, MD;  Location: [place];  Service: Vascular;  Laterality: Right;  . THROMBECTOMY W/ EMBOLECTOMY Left 02/26/2018   Procedure: THROMBECTOMY ARTERIOVENOUS GRAFT;  Surgeon: Angelia Mould, MD;  Location: [place];  Service: Vascular;  Laterality: Left;  . TRANSTHORACIC ECHOCARDIOGRAM  02/2012   [facility]) EF 45-50% with mild global HK.  Mild LVH,LA mod.  dilated,mild-mod. 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 AI.  Marland Kitchen TRANSTHORACIC ECHOCARDIOGRAM  9/'18; 3/'19   a) Severely reduced EF.  25 from 30%.  GR 1 DD with diffuse ecchymosis. Mild AS & AI.  Mild LA/RA dilation; b) (post PEA arest):   Moderately dilated LV with moderate concentric virtually.  EF 15 to 20%.  GR 1 DD.  Paradoxical septal motion. Mild AI.  Mild LA dilation.  Poorly visualized RV.  Marland Kitchen TRANSTHORACIC ECHOCARDIOGRAM  02/2019   EF 20-25%.  Unable to assess diastolic function (A. Fib).  No LV apical thrombus.  Mild AI.  Mildly reduced RV function, however poorly visualized.  Marland Kitchen ULTRASOUND GUIDANCE FOR VASCULAR ACCESS  02/20/2018   Procedure: Ultrasound Guidance For Vascular Access;  Surgeon: Jettie Booze, MD;  Location: [place] CV LAB;  Service: Cardiovascular;;  . UPPER EXTREMITY VENOGRAPHY N/A 07/08/2018   Procedure: UPPER EXTREMITY VENOGRAPHY;  Surgeon: Waynetta Sandy, MD;  Location: [place] CV LAB;  Service: Cardiovascular;  Laterality: N/A;  Bilateral    Family History  Problem Relation Age of Onset  . Hypertension Mother   . Thyroid disease Mother   . Cholelithiasis Daughter   . Cholelithiasis Son   . Hypertension Maternal Grandmother   . Diabetes Maternal Grandmother   . Heart attack Neg Hx   . Hyperlipidemia Neg Hx       Social History   Socioeconomic History  . Marital status: Married    Spouse name: Not on file  . Number of children: 2  . Years of education: 59  . Highest education level: Not on file  Occupational History  . Occupation: retired, picks up Aeronautical engineer: RETIRED  Social Needs  . Financial resource strain: Not on file  . Food insecurity    Worry: Never true    Inability: Never true  . Transportation needs    Medical: No    Non-medical: No  Tobacco Use  . Smoking status: Former Smoker    Packs/day: 1.00    Years: 30.00    Pack years: 30.00    Types: Cigarettes    Quit date:  2006    Years since quitting: 14.8  . Smokeless tobacco: Never Used  Substance and Sexual Activity  . Alcohol use: Yes    Alcohol/week: 1.0 standard drinks    Types: 1 Shots of liquor per week  . Drug use: Never  .  Sexual activity: Not Currently    Comment: declined condoms 09/2019  Lifestyle  . Physical activity    Days per week: Not on file    Minutes per session: Not on file  . Stress: Not on file  Relationships  . Social Herbalist on phone: Not on file    Gets together: Not on file    Attends religious service: Not on file    Active member of club or organization: Not on file    Attends meetings of clubs or organizations: Not on file    Relationship status: Not on file  Other Topics Concern  . Not on file  Social History Narrative   ** Merged History Encounter **       Stays active at home Regular exercise: no Drinks 2 cups of coffee a week, 1 mountain dew soda a day.    Allergies  Allergen Reactions  . Dextromethorphan-Guaifenesin     Other reaction(s): Unknown  . Tocotrienols     Other reaction(s): Unknown  . Losartan Potassium Other (See Comments)    Causes constipation  Other reaction(s): Unknown     Current Outpatient Medications:  .  acetaminophen (TYLENOL) 500 MG tablet, Take 1,000 mg by mouth every 6 (six) hours as needed for moderate pain., Disp: , Rfl:  .  allopurinol (ZYLOPRIM) 100 MG tablet, 1 tablet by mouth 3x weekly after dialysis., Disp: 90 tablet, Rfl: 1 .  amiodarone (PACERONE) 200 MG tablet, Take 1 tablet (200 mg total) by mouth daily., Disp: 90 tablet, Rfl: 3 .  amoxicillin (AMOXIL) 500 MG capsule, Take QDAILY in the am, then on dialysis days take ANOTHER dose AFTER HD (Patient taking differently: Take 500-1,000 mg by mouth See admin instructions. Take 1 capsule every day and take 2 capsules on days of dialysis), Disp: 60 capsule, Rfl: 5 .  bictegravir-emtricitabine-tenofovir AF ([name]) 50-200-25 MG TABS tablet, Take 1 tablet by  mouth daily., Disp: 30 tablet, Rfl: 5 .  brimonidine (ALPHAGAN) 0.15 % ophthalmic solution, Place 1 drop into the left eye 2 (two) times daily., Disp: , Rfl: 3 .  dorzolamide-timolol (COSOPT) 22.3-6.8 MG/ML ophthalmic solution, Place 1 drop into the left eye 2 (two) times daily., Disp: , Rfl:  .  ferric citrate (AURYXIA) 1 GM 210 MG(Fe) tablet, Take 420 mg by mouth 3 (three) times daily with meals., Disp: , Rfl:  .  latanoprost (XALATAN) 0.005 % ophthalmic solution, Place 1 drop into the left eye at bedtime. , Disp: , Rfl:  .  loratadine (CLARITIN) 10 MG tablet, Take 10 mg by mouth daily., Disp: , Rfl:  .  midodrine (PROAMATINE) 10 MG tablet, Take 1 tablet (10 mg total) by mouth as directed. Twice a day on the day of HD Tuesday, Thursday, Saturday (1 in AM and 1 at [place]). Take 1/2 tablet (5mg ) a day on non HD days (Patient taking differently: Take 10 mg by mouth See admin instructions. Take 1 tablet (10 mg) by mouth 3 times daily on Tuesdays, Thursdays, & Saturdays.), Disp: 90 tablet, Rfl: 5 .  montelukast (SINGULAIR) 10 MG tablet, Take 1 tablet (10 mg total) by mouth at bedtime., Disp: 30 tablet, Rfl: 3 .  multivitamin (RENA-VIT) TABS tablet, Take 1 tablet by mouth daily at 2 PM. , Disp: , Rfl:  .  warfarin (COUMADIN) 5 MG tablet, Take 1/2 to 1 tablet by mouth daily as directed by coumadin clinic (Patient taking differently: Take 2.5-5 mg by mouth See admin instructions. Take 1/2  tablet on Monday and Friday then take 1 tablet all the other days), Disp: 90 tablet, Rfl: 0   Review of Systems  Constitutional: Positive for fatigue. Negative for activity change, appetite change, chills, fever and unexpected weight change.  HENT: Positive for postnasal drip. Negative for congestion, rhinorrhea, sinus pressure, sneezing, sore throat and trouble swallowing.   Respiratory: Negative for cough.   Cardiovascular: Negative for chest pain, palpitations and leg swelling.  Gastrointestinal: Negative for abdominal  distention, abdominal pain, constipation, nausea and vomiting.  Genitourinary: Negative for hematuria.  Musculoskeletal: Negative for myalgias.  Skin: Negative for color change, pallor, rash and wound.  Neurological: Negative for dizziness, tremors, weakness and light-headedness.  Hematological: Negative for adenopathy. Does not bruise/bleed easily.  Psychiatric/Behavioral: Negative for agitation, behavioral problems, confusion, decreased concentration and sleep disturbance.       Objective:   Physical Exam  Constitutional: He is oriented to person, place, and time. He appears well-developed and well-nourished. No distress.  HENT:  Head: Normocephalic and atraumatic.  Mouth/Throat: Oropharynx is clear and moist. Abnormal dentition. Dental caries present. No oropharyngeal exudate.  Eyes: Conjunctivae and EOM are normal. No scleral icterus.  Neck: Normal range of motion. Neck supple.  Cardiovascular: Normal rate, regular rhythm and normal heart sounds. Exam reveals no gallop and no friction rub.  No murmur heard. Pulmonary/Chest: Effort normal and breath sounds normal. No respiratory distress.  Abdominal: Soft. Bowel sounds are normal. He exhibits no distension. There is no abdominal tenderness. There is no rebound.  Musculoskeletal:        General: No tenderness or edema.  Neurological: He is alert and oriented to person, place, and time. He has normal reflexes. He exhibits normal muscle tone. Coordination normal.  Skin: Skin is warm and dry. He is not diaphoretic. No erythema. No pallor.  Psychiatric: His behavior is normal. Judgment and thought content normal. He exhibits a depressed mood.          Assessment & Plan:   Positive blood culture for actinomyces:  He will finish out his amoxicillin and on the October 30 and is to stop it but keep a hold of the last bottle of amoxicillin pills  HIV/AIDS: Continue [name] and come back in January for repeat labs and renewal of SPAP    CKD: followed by Dr Posey Pronto now on hemodialysis  HTN: Followed PCP   There were no vitals filed for this visit.   NICM: Cardiology following closely

## 2019-09-30 DIAGNOSIS — N2581 Secondary hyperparathyroidism of renal origin: Secondary | ICD-10-CM | POA: Diagnosis not present

## 2019-09-30 DIAGNOSIS — N186 End stage renal disease: Secondary | ICD-10-CM | POA: Diagnosis not present

## 2019-09-30 DIAGNOSIS — Z992 Dependence on renal dialysis: Secondary | ICD-10-CM | POA: Diagnosis not present

## 2019-10-01 ENCOUNTER — Other Ambulatory Visit: Payer: Self-pay

## 2019-10-01 NOTE — Patient Outreach (Signed)
[facility]) Care Management  10/01/2019  [place] 06/28/1941 [id number]   Telephone Assessment    Outreach attempt #1 to patient. Spoke with paitent who voices that he is doing well. He denies any acute issues or concerns at present. He voices he is adhering to dialysis schedule and has been tolerating treatments without any issues. He statesthat he goes next week for vein mapping to his arm. He will have to get permanent dialysis access placed as he currently just has a catheter. He denies any s/s of infection to site. Patient pleased to report that he went to see infectious MD earlier this week and his "levels were down." He goes to see PCP in Jan. He denies any RN CM needs at present and is aware to call if needs should arise.       Plan: RN CM will make outreach attempt to patient in one month.   Enzo Montgomery, RN,BSN,CCM [facility] Telephonic Care Management Coordinator Direct Phone: [phone] Toll Free: [phone] Fax: [phone]

## 2019-10-02 DIAGNOSIS — N2581 Secondary hyperparathyroidism of renal origin: Secondary | ICD-10-CM | POA: Diagnosis not present

## 2019-10-02 DIAGNOSIS — N186 End stage renal disease: Secondary | ICD-10-CM | POA: Diagnosis not present

## 2019-10-02 DIAGNOSIS — Z992 Dependence on renal dialysis: Secondary | ICD-10-CM | POA: Diagnosis not present

## 2019-10-02 LAB — PROTIME-INR: INR: 3 — AB (ref 0.9–1.1)

## 2019-10-04 DIAGNOSIS — Z992 Dependence on renal dialysis: Secondary | ICD-10-CM | POA: Diagnosis not present

## 2019-10-04 DIAGNOSIS — B20 Human immunodeficiency virus [HIV] disease: Secondary | ICD-10-CM | POA: Diagnosis not present

## 2019-10-04 DIAGNOSIS — N2581 Secondary hyperparathyroidism of renal origin: Secondary | ICD-10-CM | POA: Diagnosis not present

## 2019-10-04 DIAGNOSIS — N186 End stage renal disease: Secondary | ICD-10-CM | POA: Diagnosis not present

## 2019-10-07 DIAGNOSIS — N186 End stage renal disease: Secondary | ICD-10-CM | POA: Diagnosis not present

## 2019-10-07 DIAGNOSIS — Z992 Dependence on renal dialysis: Secondary | ICD-10-CM | POA: Diagnosis not present

## 2019-10-07 DIAGNOSIS — N2581 Secondary hyperparathyroidism of renal origin: Secondary | ICD-10-CM | POA: Diagnosis not present

## 2019-10-08 ENCOUNTER — Encounter (HOSPITAL_COMMUNITY): Payer: Medicare HMO

## 2019-10-08 ENCOUNTER — Ambulatory Visit: Payer: Medicare HMO | Admitting: Vascular Surgery

## 2019-10-09 ENCOUNTER — Other Ambulatory Visit: Payer: Self-pay

## 2019-10-09 DIAGNOSIS — N2581 Secondary hyperparathyroidism of renal origin: Secondary | ICD-10-CM | POA: Diagnosis not present

## 2019-10-09 DIAGNOSIS — Z992 Dependence on renal dialysis: Secondary | ICD-10-CM

## 2019-10-09 DIAGNOSIS — N186 End stage renal disease: Secondary | ICD-10-CM

## 2019-10-09 LAB — POCT INR: INR: 2.7 (ref 2.0–3.0)

## 2019-10-10 DIAGNOSIS — H44422 Hypotony of left eye due to ocular fistula: Secondary | ICD-10-CM | POA: Diagnosis not present

## 2019-10-10 DIAGNOSIS — H401133 Primary open-angle glaucoma, bilateral, severe stage: Secondary | ICD-10-CM | POA: Diagnosis not present

## 2019-10-11 DIAGNOSIS — Z992 Dependence on renal dialysis: Secondary | ICD-10-CM | POA: Diagnosis not present

## 2019-10-11 DIAGNOSIS — N2581 Secondary hyperparathyroidism of renal origin: Secondary | ICD-10-CM | POA: Diagnosis not present

## 2019-10-11 DIAGNOSIS — N186 End stage renal disease: Secondary | ICD-10-CM | POA: Diagnosis not present

## 2019-10-13 ENCOUNTER — Ambulatory Visit (INDEPENDENT_AMBULATORY_CARE_PROVIDER_SITE_OTHER): Payer: Medicare HMO | Admitting: Cardiology

## 2019-10-13 DIAGNOSIS — Z515 Encounter for palliative care: Secondary | ICD-10-CM

## 2019-10-13 DIAGNOSIS — I482 Chronic atrial fibrillation, unspecified: Secondary | ICD-10-CM

## 2019-10-14 DIAGNOSIS — Z992 Dependence on renal dialysis: Secondary | ICD-10-CM | POA: Diagnosis not present

## 2019-10-14 DIAGNOSIS — N186 End stage renal disease: Secondary | ICD-10-CM | POA: Diagnosis not present

## 2019-10-14 DIAGNOSIS — N2581 Secondary hyperparathyroidism of renal origin: Secondary | ICD-10-CM | POA: Diagnosis not present

## 2019-10-15 ENCOUNTER — Telehealth (HOSPITAL_COMMUNITY): Payer: Self-pay | Admitting: *Deleted

## 2019-10-15 NOTE — Telephone Encounter (Signed)

## 2019-10-16 ENCOUNTER — Other Ambulatory Visit: Payer: Self-pay | Admitting: *Deleted

## 2019-10-16 ENCOUNTER — Other Ambulatory Visit: Payer: Self-pay | Admitting: Vascular Surgery

## 2019-10-16 ENCOUNTER — Ambulatory Visit (INDEPENDENT_AMBULATORY_CARE_PROVIDER_SITE_OTHER): Payer: Medicare HMO | Admitting: Vascular Surgery

## 2019-10-16 ENCOUNTER — Ambulatory Visit (HOSPITAL_COMMUNITY)
Admission: RE | Admit: 2019-10-16 | Discharge: 2019-10-16 | Disposition: A | Discharge status: Home / Self Care | Payer: Medicare HMO | Source: Ambulatory Visit | Attending: Vascular Surgery | Admitting: Vascular Surgery

## 2019-10-16 ENCOUNTER — Encounter: Payer: Self-pay | Admitting: *Deleted

## 2019-10-16 ENCOUNTER — Ambulatory Visit (HOSPITAL_COMMUNITY)
Admission: RE | Admit: 2019-10-16 | Discharge: 2019-10-16 | Disposition: A | Discharge status: Home / Self Care | Payer: Medicare HMO | Source: Ambulatory Visit | Attending: Family | Admitting: Family

## 2019-10-16 ENCOUNTER — Encounter: Payer: Self-pay | Admitting: Vascular Surgery

## 2019-10-16 ENCOUNTER — Other Ambulatory Visit: Payer: Self-pay

## 2019-10-16 VITALS — BP 119/74 | HR 71 | Temp 97.8°F | Resp 20 | Ht 74.0 in | Wt 186.0 lb

## 2019-10-16 DIAGNOSIS — N2581 Secondary hyperparathyroidism of renal origin: Secondary | ICD-10-CM | POA: Diagnosis not present

## 2019-10-16 DIAGNOSIS — Z992 Dependence on renal dialysis: Secondary | ICD-10-CM

## 2019-10-16 DIAGNOSIS — N186 End stage renal disease: Secondary | ICD-10-CM

## 2019-10-16 DIAGNOSIS — Z01818 Encounter for other preprocedural examination: Secondary | ICD-10-CM | POA: Diagnosis not present

## 2019-10-16 NOTE — Progress Notes (Signed)
Patient name: David [name] MRN: [id number] DOB: 06/27/1941 Sex: male  HPI: [name] is a 78 y.o. male, sent for consideration of a new hemodialysis access.  Patient has a lengthy access history.  Left basilic vein transposition fistula 1021, right arm basilic vein fistula 1173 left upper arm graft upper arm graft 2019, right upper arm graft November 2019.  His right upper arm graft has now occluded on several occasions.  He does not have any claudication symptoms in his legs although he does not walk much.  Other chronic medical problems include HIV, atrial fibrillation on warfarin, congestive heart failure, hypertension hyperlipidemia all of which have been stable.  Past Medical History:  Diagnosis Date  . 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 Acute on chronic systolic and diastolic heart failure, NYHA class 4 ([place])   . Anemia, iron deficiency 11/15/2011  . Arthritis    "hands, right knee, feet" (02/21/2018)  . Atrial fibrillation ([place])   . Cancer [facility])    hx of prostate; s/p radioactive seed implant 10/2009 Dr Janice Norrie  . Cardiac arrest ([place]) 02/17/2018  . CHF (congestive heart failure) ([place])   . Chronic combined systolic and diastolic heart failure, NYHA class 3 ([place]) 06/2010   felt to be secondary to hypertensive cardiomyopathy  . Chronic lower back pain   . CKD (chronic kidney disease) stage V requiring chronic dialysis ([place])   . ESRD on dialysis [facility])    started 02/2018 [place]  . GERD (gastroesophageal reflux disease)   . Glaucoma    "I had surgery and dont have it any more"  . Gout    daily RX (02/21/2018)  . Heart murmur    "mild" per pt  . Hepatitis    years ago  . HIV (human immunodeficiency virus infection) ([place])   . HIV infection ([place])   . Hyperlipidemia   . Hypertension    followed by [facility] (Dr Dani Gobble Croitoru)  . Hypertension   . Nonischemic cardiomyopathy ([place])   . Pneumonia 11/2017  .  Prostate cancer ([place])   . Sinus bradycardia   . Sleep apnea    does not use a cpap  . Stroke [facility])    "mini stroke" years ago   Past Surgical History:  Procedure Laterality Date  . AV FISTULA PLACEMENT Right 10/13/2016   Procedure: ARTERIOVENOUS (AV) FISTULA CREATION;  Surgeon: [name], MD;  Location: [place];  Service: Vascular;  Laterality: Right;  . AV FISTULA PLACEMENT Left 02/25/2018   Procedure: INSERTION OF ARTERIOVENOUS (AV) GORE-TEX GRAFT LEFT UPPER ARM;  Surgeon: Angelia Mould, MD;  Location: [place];  Service: Vascular;  Laterality: Left;  . AV FISTULA PLACEMENT Right 08/07/2018   Procedure: Creation of right arm brachiocephalic Fistula;  Surgeon: Waynetta Sandy, MD;  Location: [place];  Service: Vascular;  Laterality: Right;  . BASCILIC VEIN TRANSPOSITION Left 06/24/2015   Procedure: BASILIC VEIN TRANSPOSITION;  Surgeon: Mal Misty, MD;  Location: [place];  Service: Vascular;  Laterality: Left;  . BIOPSY  03/14/2019   Procedure: BIOPSY;  Surgeon: Irene Shipper, MD;  Location: [place];  Service: Endoscopy;;  . CATARACT EXTRACTION, BILATERAL Bilateral   . COLONOSCOPY WITH PROPOFOL N/A 03/14/2019   Procedure: COLONOSCOPY WITH PROPOFOL;  Surgeon: Irene Shipper, MD;  Location: [facility] ENDOSCOPY;  Service: Endoscopy;  Laterality: N/A;  . ESOPHAGOGASTRODUODENOSCOPY (EGD) WITH PROPOFOL N/A 05/05/2014   Procedure: ESOPHAGOGASTRODUODENOSCOPY (EGD) WITH PROPOFOL;  Surgeon: Missy Sabins,  MD;  Location: [facility] ENDOSCOPY;  Service: Endoscopy;  Laterality: N/A;  . EYE SURGERY Bilateral    cataract surgery   . GLAUCOMA SURGERY Bilateral   . INSERTION OF ARTERIOVENOUS (AV) ARTEGRAFT ARM  10/09/2018   Procedure: INSERTION OF ARTERIOVENOUS (AV) 42mm x 41cm ARTEGRAFT LEFT UPPER ARM;  Surgeon: Waynetta Sandy, MD;  Location: [place];  Service: Vascular;;  . INSERTION OF DIALYSIS CATHETER Right 07/14/2019   Procedure: Insertion Of Dialysis Catheter;  Surgeon: Elam Dutch, MD;   Location: [facility] OR;  Service: Vascular;  Laterality: Right;  placed right Internal Jugular  . INSERTION PROSTATE RADIATION SEED    . IR FLUORO GUIDE CV LINE RIGHT  02/18/2018  . IR REMOVAL TUN CV CATH W/O FL  12/06/2018  . IR US GUIDE VASC ACCESS RIGHT  02/18/2018  . LEFT HEART CATH AND CORONARY ANGIOGRAPHY N/A 02/20/2018   Procedure: LEFT HEART CATH AND CORONARY ANGIOGRAPHY;  Surgeon: Jettie Booze, MD;  Location: [place] CV LAB;;; 50% ostial OM2, 25% mLAD.  Marland Kitchen LEFT HEART CATH AND CORONARY ANGIOGRAPHY  2004   mildly depressed LV systolic fx EF 91%,MBWGYK coronaries/abdominal aorta/renal arteries.  Marland Kitchen NM MYOCAR PERF WALL MOTION  02/21/2010   No ischemia or infarction.  EF 27%.  Marland Kitchen RADIOACTIVE SEED IMPLANT  2010   prostate cancer  . REVISION OF ARTERIOVENOUS GORETEX GRAFT Right 07/11/2019   Procedure: REVISION OF ARTERIOVENOUS GORETEX GRAFT RIGHT ARM WITH ARTEGRAFT;  Surgeon: Serafina Mitchell, MD;  Location: [place];  Service: Vascular;  Laterality: Right;  . SHUNTOGRAM Right 07/14/2019   Procedure: Shuntogram;  Surgeon: Elam Dutch, MD;  Location: [place];  Service: Vascular;  Laterality: Right;  . THROMBECTOMY AND REVISION OF ARTERIOVENTOUS (AV) GORETEX  GRAFT Right 07/14/2019   Procedure: THROMBECTOMY AND REVISION OF ARTERIOVENTOUS (AV) GORETEX  GRAFT;  Surgeon: Elam Dutch, MD;  Location: [place];  Service: Vascular;  Laterality: Right;  . THROMBECTOMY W/ EMBOLECTOMY Left 02/26/2018   Procedure: THROMBECTOMY ARTERIOVENOUS GRAFT;  Surgeon: Angelia Mould, MD;  Location: [place];  Service: Vascular;  Laterality: Left;  . TRANSTHORACIC ECHOCARDIOGRAM  02/2012   [facility]) EF 45-50% with mild global HK.  Mild LVH,LA mod. dilated,mild-mod. 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 AI.  Marland Kitchen TRANSTHORACIC ECHOCARDIOGRAM  9/'18; 3/'19   a) Severely reduced EF.  25 from 30%.  GR 1 DD with diffuse ecchymosis. Mild AS & AI.  Mild LA/RA dilation; b) (post PEA arest):    Moderately dilated LV with moderate concentric virtually.  EF 15 to 20%.  GR 1 DD.  Paradoxical septal motion. Mild AI.  Mild LA dilation.  Poorly visualized RV.  Marland Kitchen TRANSTHORACIC ECHOCARDIOGRAM  02/2019   EF 20-25%.  Unable to assess diastolic function (A. Fib).  No LV apical thrombus.  Mild AI.  Mildly reduced RV function, however poorly visualized.  Marland Kitchen ULTRASOUND GUIDANCE FOR VASCULAR ACCESS  02/20/2018   Procedure: Ultrasound Guidance For Vascular Access;  Surgeon: Jettie Booze, MD;  Location: [place] CV LAB;  Service: Cardiovascular;;  . UPPER EXTREMITY VENOGRAPHY N/A 07/08/2018   Procedure: UPPER EXTREMITY VENOGRAPHY;  Surgeon: Waynetta Sandy, MD;  Location: [place] CV LAB;  Service: Cardiovascular;  Laterality: N/A;  Bilateral    Family History  Problem Relation Age of Onset  . Hypertension Mother   . Thyroid disease Mother   . Cholelithiasis Daughter   . Cholelithiasis Son   . Hypertension Maternal Grandmother   . Diabetes  Maternal Grandmother   . Heart attack Neg Hx   . Hyperlipidemia Neg Hx     SOCIAL HISTORY: Social History   Socioeconomic History  . Marital status: Married    Spouse name: Not on file  . Number of children: 2  . Years of education: 18  . Highest education level: Not on file  Occupational History  . Occupation: retired, picks up Aeronautical engineer: RETIRED  Social Needs  . Financial resource strain: Not on file  . Food insecurity    Worry: Never true    Inability: Never true  . Transportation needs    Medical: No    Non-medical: No  Tobacco Use  . Smoking status: Former Smoker    Packs/day: 1.00    Years: 30.00    Pack years: 30.00    Types: Cigarettes    Quit date: 2006    Years since quitting: 14.8  . Smokeless tobacco: Never Used  Substance and Sexual Activity  . Alcohol use: Yes    Alcohol/week: 1.0 standard drinks    Types: 1 Shots of liquor per week  . Drug use: Never  . Sexual activity: Not Currently     Comment: declined condoms 09/2019  Lifestyle  . Physical activity    Days per week: Not on file    Minutes per session: Not on file  . Stress: Not on file  Relationships  . Social Herbalist on phone: Not on file    Gets together: Not on file    Attends religious service: Not on file    Active member of club or organization: Not on file    Attends meetings of clubs or organizations: Not on file    Relationship status: Not on file  . Intimate partner violence    Fear of current or ex partner: Not on file    Emotionally abused: Not on file    Physically abused: Not on file    Forced sexual activity: Not on file  Other Topics Concern  . Not on file  Social History Narrative   ** Merged History Encounter **       Stays active at home Regular exercise: no Drinks 2 cups of coffee a week, 1 mountain dew soda a day.    Allergies  Allergen Reactions  . Dextromethorphan-Guaifenesin     Other reaction(s): Unknown  . Tocotrienols     Other reaction(s): Unknown  . Losartan Potassium Other (See Comments)    Causes constipation  Other reaction(s): Unknown    Current Outpatient Medications  Medication Sig Dispense Refill  . acetaminophen (TYLENOL) 500 MG tablet Take 1,000 mg by mouth every 6 (six) hours as needed for moderate pain.    Marland Kitchen allopurinol (ZYLOPRIM) 100 MG tablet 1 tablet by mouth 3x weekly after dialysis. 90 tablet 1  . amiodarone (PACERONE) 200 MG tablet Take 1 tablet (200 mg total) by mouth daily. 90 tablet 3  . amoxicillin (AMOXIL) 500 MG capsule Take QDAILY in the am, then on dialysis days take ANOTHER dose AFTER HD (Patient taking differently: Take 500-1,000 mg by mouth See admin instructions. Take 1 capsule every day and take 2 capsules on days of dialysis) 60 capsule 5  . bictegravir-emtricitabine-tenofovir AF ([name]) 50-200-25 MG TABS tablet Take 1 tablet by mouth daily. 30 tablet 5  . brimonidine (ALPHAGAN) 0.15 % ophthalmic solution Place 1 drop  into the left eye 2 (two) times daily.  3  . dorzolamide-timolol (COSOPT)  22.3-6.8 MG/ML ophthalmic solution Place 1 drop into the left eye 2 (two) times daily.    . ferric citrate (AURYXIA) 1 GM 210 MG(Fe) tablet Take 420 mg by mouth 3 (three) times daily with meals.    . latanoprost (XALATAN) 0.005 % ophthalmic solution Place 1 drop into the left eye at bedtime.     Marland Kitchen loratadine (CLARITIN) 10 MG tablet Take 10 mg by mouth daily.    . midodrine (PROAMATINE) 10 MG tablet Take 1 tablet (10 mg total) by mouth as directed. Twice a day on the day of HD Tuesday, Thursday, Saturday (1 in AM and 1 at [place]). Take 1/2 tablet (5mg ) a day on non HD days (Patient taking differently: Take 10 mg by mouth See admin instructions. Take 1 tablet (10 mg) by mouth 3 times daily on Tuesdays, Thursdays, & Saturdays.) 90 tablet 5  . montelukast (SINGULAIR) 10 MG tablet Take 1 tablet (10 mg total) by mouth at bedtime. 30 tablet 3  . multivitamin (RENA-VIT) TABS tablet Take 1 tablet by mouth daily at 2 PM.     . warfarin (COUMADIN) 5 MG tablet Take 1/2 to 1 tablet by mouth daily as directed by coumadin clinic (Patient taking differently: Take 2.5-5 mg by mouth See admin instructions. Take 1/2 tablet on Monday and Friday then take 1 tablet all the other days) 90 tablet 0   No current facility-administered medications for this visit.     ROS:   General:  No weight loss, Fever, chills  HEENT: No recent headaches, no nasal bleeding, no visual changes, no sore throat  Neurologic: No dizziness, blackouts, seizures. No recent symptoms of stroke or mini- stroke. No recent episodes of slurred speech, or temporary blindness.  Cardiac: No recent episodes of chest pain/pressure, no shortness of breath at rest.  No shortness of breath with exertion.  Denies history of atrial fibrillation or irregular heartbeat  Vascular: No history of rest pain in feet.  No history of claudication.  No history of non-healing ulcer, No history of  DVT   Pulmonary: No home oxygen, no productive cough, no hemoptysis,  No asthma or wheezing  Musculoskeletal:  [X]  Arthritis, [ ]  Low back pain,  [X]  Joint pain  Hematologic:No history of hypercoagulable state.  No history of easy bleeding.  No history of anemia  Gastrointestinal: No hematochezia or melena,  No gastroesophageal reflux, no trouble swallowing  Urinary: [X]  chronic Kidney disease, [X]  on HD - [ ]  MWF or [X]  TTHS, [ ]  Burning with urination, [ ]  Frequent urination, [ ]  Difficulty urinating;   Skin: No rashes  Psychological: No history of anxiety,  No history of depression   Physical Examination   Vitals:   10/16/19 1424  BP: 119/74  Pulse: 71  Resp: 20  Temp: 97.8 F (36.6 C)  SpO2: 99%  Weight: 186 lb (84.4 kg)  Height: 6\' 2"  (1.88 m)    General:  Alert and oriented, no acute distress HEENT: Normal Neck: No JVD Cardiac: Regular Rate and Rhythm Skin: No rash Extremity Pulses:  2+ radial, brachial, femoral, absent dorsalis pedis, posterior tibial pulses bilaterally Musculoskeletal: No deformity or edema  Neurologic: Upper and lower extremity motor 5/5 and symmetric  DATA:  Patient had bilateral ABIs performed today which were greater than 1 and triphasic bilaterally  ASSESSMENT: Patient with multiple failed upper extremity access procedures.  I believe his best option at this point will be a thigh graft rather than considering a hero graft since multiple prior  upper extremity procedures have completely failed.  Risk benefits possible complications and procedure details discussed with patient today including not limited to bleeding infection graft thrombosis possible limb ischemia.  He understands and agrees to proceed.  PLAN: Left thigh AV graft November 10, 2019.  We will need to stop his warfarin perioperatively.  Patient would prefer to go home same day if at all possible.  If this operation goes smoothly we will consider same-day discharge otherwise he may  need to spend 1 night in the hospital.   Ruta Hinds, MD [facility]: [phone] Pager: [phone]

## 2019-10-16 NOTE — H&P (View-Only) (Signed)
Patient name: David [name] MRN: [id number] DOB: 05/16/1941 Sex: male  HPI: David [name] is a 78 y.o. male, sent for consideration of a new hemodialysis access.  Patient has a lengthy access history.  Left basilic vein transposition fistula 0539, right arm basilic vein fistula 7673 left upper arm graft upper arm graft 2019, right upper arm graft November 2019.  His right upper arm graft has now occluded on several occasions.  He does not have any claudication symptoms in his legs although he does not walk much.  Other chronic medical problems include HIV, atrial fibrillation on warfarin, congestive heart failure, hypertension hyperlipidemia all of which have been stable.  Past Medical History:  Diagnosis Date  . 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 Acute on chronic systolic and diastolic heart failure, NYHA class 4 ([place])   . Anemia, iron deficiency 11/15/2011  . Arthritis    "hands, right knee, feet" (02/21/2018)  . Atrial fibrillation ([place])   . Cancer [facility])    hx of prostate; s/p radioactive seed implant 10/2009 Dr Janice Norrie  . Cardiac arrest ([place]) 02/17/2018  . CHF (congestive heart failure) ([place])   . Chronic combined systolic and diastolic heart failure, NYHA class 3 ([place]) 06/2010   felt to be secondary to hypertensive cardiomyopathy  . Chronic lower back pain   . CKD (chronic kidney disease) stage V requiring chronic dialysis ([place])   . ESRD on dialysis [facility])    started 02/2018 [place]  . GERD (gastroesophageal reflux disease)   . Glaucoma    "I had surgery and dont have it any more"  . Gout    daily RX (02/21/2018)  . Heart murmur    "mild" per pt  . Hepatitis    years ago  . HIV (human immunodeficiency virus infection) ([place])   . HIV infection ([place])   . Hyperlipidemia   . Hypertension    followed by [facility] (Dr Dani Gobble Croitoru)  . Hypertension   . Nonischemic cardiomyopathy ([place])   . Pneumonia 11/2017  .  Prostate cancer ([place])   . Sinus bradycardia   . Sleep apnea    does not use a cpap  . Stroke [facility])    "mini stroke" years ago   Past Surgical History:  Procedure Laterality Date  . AV FISTULA PLACEMENT Right 10/13/2016   Procedure: ARTERIOVENOUS (AV) FISTULA CREATION;  Surgeon: [name], MD;  Location: [place];  Service: Vascular;  Laterality: Right;  . AV FISTULA PLACEMENT Left 02/25/2018   Procedure: INSERTION OF ARTERIOVENOUS (AV) GORE-TEX GRAFT LEFT UPPER ARM;  Surgeon: Angelia Mould, MD;  Location: [place];  Service: Vascular;  Laterality: Left;  . AV FISTULA PLACEMENT Right 08/07/2018   Procedure: Creation of right arm brachiocephalic Fistula;  Surgeon: Waynetta Sandy, MD;  Location: [place];  Service: Vascular;  Laterality: Right;  . BASCILIC VEIN TRANSPOSITION Left 06/24/2015   Procedure: BASILIC VEIN TRANSPOSITION;  Surgeon: Mal Misty, MD;  Location: [place];  Service: Vascular;  Laterality: Left;  . BIOPSY  03/14/2019   Procedure: BIOPSY;  Surgeon: Irene Shipper, MD;  Location: [place];  Service: Endoscopy;;  . CATARACT EXTRACTION, BILATERAL Bilateral   . COLONOSCOPY WITH PROPOFOL N/A 03/14/2019   Procedure: COLONOSCOPY WITH PROPOFOL;  Surgeon: Irene Shipper, MD;  Location: [facility] ENDOSCOPY;  Service: Endoscopy;  Laterality: N/A;  . ESOPHAGOGASTRODUODENOSCOPY (EGD) WITH PROPOFOL N/A 05/05/2014   Procedure: ESOPHAGOGASTRODUODENOSCOPY (EGD) WITH PROPOFOL;  Surgeon: Missy Sabins,  MD;  Location: [facility] ENDOSCOPY;  Service: Endoscopy;  Laterality: N/A;  . EYE SURGERY Bilateral    cataract surgery   . GLAUCOMA SURGERY Bilateral   . INSERTION OF ARTERIOVENOUS (AV) ARTEGRAFT ARM  10/09/2018   Procedure: INSERTION OF ARTERIOVENOUS (AV) 65mm x 41cm ARTEGRAFT LEFT UPPER ARM;  Surgeon: Waynetta Sandy, MD;  Location: [place];  Service: Vascular;;  . INSERTION OF DIALYSIS CATHETER Right 07/14/2019   Procedure: Insertion Of Dialysis Catheter;  Surgeon: Elam Dutch, MD;   [facility];  Service: Vascular;  Laterality: Right;  placed right Internal Jugular  . INSERTION PROSTATE RADIATION SEED    . IR FLUORO GUIDE CV LINE RIGHT  02/18/2018  . IR REMOVAL TUN CV CATH W/O FL  12/06/2018  . IR US GUIDE VASC ACCESS RIGHT  02/18/2018  . LEFT HEART CATH AND CORONARY ANGIOGRAPHY N/A 02/20/2018   Procedure: LEFT HEART CATH AND CORONARY ANGIOGRAPHY;  Surgeon: Jettie Booze, MD;  Location: [place] CV LAB;;; 50% ostial OM2, 25% mLAD.  Marland Kitchen LEFT HEART CATH AND CORONARY ANGIOGRAPHY  2004   mildly depressed LV systolic fx EF 83%,MOQHUT coronaries/abdominal aorta/renal arteries.  Marland Kitchen NM MYOCAR PERF WALL MOTION  02/21/2010   No ischemia or infarction.  EF 27%.  Marland Kitchen RADIOACTIVE SEED IMPLANT  2010   prostate cancer  . REVISION OF ARTERIOVENOUS GORETEX GRAFT Right 07/11/2019   Procedure: REVISION OF ARTERIOVENOUS GORETEX GRAFT RIGHT ARM WITH ARTEGRAFT;  Surgeon: Serafina Mitchell, MD;  Location: [place];  Service: Vascular;  Laterality: Right;  . SHUNTOGRAM Right 07/14/2019   Procedure: Shuntogram;  Surgeon: Elam Dutch, MD;  Location: [place];  Service: Vascular;  Laterality: Right;  . THROMBECTOMY AND REVISION OF ARTERIOVENTOUS (AV) GORETEX  GRAFT Right 07/14/2019   Procedure: THROMBECTOMY AND REVISION OF ARTERIOVENTOUS (AV) GORETEX  GRAFT;  Surgeon: Elam Dutch, MD;  Location: [place];  Service: Vascular;  Laterality: Right;  . THROMBECTOMY W/ EMBOLECTOMY Left 02/26/2018   Procedure: THROMBECTOMY ARTERIOVENOUS GRAFT;  Surgeon: Angelia Mould, MD;  Location: [place];  Service: Vascular;  Laterality: Left;  . TRANSTHORACIC ECHOCARDIOGRAM  02/2012   [name]) EF 45-50% with mild global HK.  Mild LVH,LA mod. dilated,mild-mod. 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 AI.  Marland Kitchen TRANSTHORACIC ECHOCARDIOGRAM  9/'18; 3/'19   a) Severely reduced EF.  25 from 30%.  GR 1 DD with diffuse ecchymosis. Mild AS & AI.  Mild LA/RA dilation; b) (post PEA arest):    Moderately dilated LV with moderate concentric virtually.  EF 15 to 20%.  GR 1 DD.  Paradoxical septal motion. Mild AI.  Mild LA dilation.  Poorly visualized RV.  Marland Kitchen TRANSTHORACIC ECHOCARDIOGRAM  02/2019   EF 20-25%.  Unable to assess diastolic function (A. Fib).  No LV apical thrombus.  Mild AI.  Mildly reduced RV function, however poorly visualized.  Marland Kitchen ULTRASOUND GUIDANCE FOR VASCULAR ACCESS  02/20/2018   Procedure: Ultrasound Guidance For Vascular Access;  Surgeon: Jettie Booze, MD;  Location: [place] CV LAB;  Service: Cardiovascular;;  . UPPER EXTREMITY VENOGRAPHY N/A 07/08/2018   Procedure: UPPER EXTREMITY VENOGRAPHY;  Surgeon: Waynetta Sandy, MD;  Location: [place] CV LAB;  Service: Cardiovascular;  Laterality: N/A;  Bilateral    Family History  Problem Relation Age of Onset  . Hypertension Mother   . Thyroid disease Mother   . Cholelithiasis Daughter   . Cholelithiasis Son   . Hypertension Maternal Grandmother   . Diabetes  Maternal Grandmother   . Heart attack Neg Hx   . Hyperlipidemia Neg Hx     SOCIAL HISTORY: Social History   Socioeconomic History  . Marital status: Married    Spouse name: Not on file  . Number of children: 2  . Years of education: 74  . Highest education level: Not on file  Occupational History  . Occupation: retired, picks up Aeronautical engineer: RETIRED  Social Needs  . Financial resource strain: Not on file  . Food insecurity    Worry: Never true    Inability: Never true  . Transportation needs    Medical: No    Non-medical: No  Tobacco Use  . Smoking status: Former Smoker    Packs/day: 1.00    Years: 30.00    Pack years: 30.00    Types: Cigarettes    Quit date: 2006    Years since quitting: 14.8  . Smokeless tobacco: Never Used  Substance and Sexual Activity  . Alcohol use: Yes    Alcohol/week: 1.0 standard drinks    Types: 1 Shots of liquor per week  . Drug use: Never  . Sexual activity: Not Currently     Comment: declined condoms 09/2019  Lifestyle  . Physical activity    Days per week: Not on file    Minutes per session: Not on file  . Stress: Not on file  Relationships  . Social Herbalist on phone: Not on file    Gets together: Not on file    Attends religious service: Not on file    Active member of club or organization: Not on file    Attends meetings of clubs or organizations: Not on file    Relationship status: Not on file  . Intimate partner violence    Fear of current or ex partner: Not on file    Emotionally abused: Not on file    Physically abused: Not on file    Forced sexual activity: Not on file  Other Topics Concern  . Not on file  Social History Narrative   ** Merged History Encounter **       Stays active at home Regular exercise: no Drinks 2 cups of coffee a week, 1 mountain dew soda a day.    Allergies  Allergen Reactions  . Dextromethorphan-Guaifenesin     Other reaction(s): Unknown  . Tocotrienols     Other reaction(s): Unknown  . Losartan Potassium Other (See Comments)    Causes constipation  Other reaction(s): Unknown    Current Outpatient Medications  Medication Sig Dispense Refill  . acetaminophen (TYLENOL) 500 MG tablet Take 1,000 mg by mouth every 6 (six) hours as needed for moderate pain.    Marland Kitchen allopurinol (ZYLOPRIM) 100 MG tablet 1 tablet by mouth 3x weekly after dialysis. 90 tablet 1  . amiodarone (PACERONE) 200 MG tablet Take 1 tablet (200 mg total) by mouth daily. 90 tablet 3  . amoxicillin (AMOXIL) 500 MG capsule Take QDAILY in the am, then on dialysis days take ANOTHER dose AFTER HD (Patient taking differently: Take 500-1,000 mg by mouth See admin instructions. Take 1 capsule every day and take 2 capsules on days of dialysis) 60 capsule 5  . bictegravir-emtricitabine-tenofovir AF ([name]) 50-200-25 MG TABS tablet Take 1 tablet by mouth daily. 30 tablet 5  . brimonidine (ALPHAGAN) 0.15 % ophthalmic solution Place 1 drop  into the left eye 2 (two) times daily.  3  . dorzolamide-timolol (COSOPT)  22.3-6.8 MG/ML ophthalmic solution Place 1 drop into the left eye 2 (two) times daily.    . ferric citrate (AURYXIA) 1 GM 210 MG(Fe) tablet Take 420 mg by mouth 3 (three) times daily with meals.    . latanoprost (XALATAN) 0.005 % ophthalmic solution Place 1 drop into the left eye at bedtime.     Marland Kitchen loratadine (CLARITIN) 10 MG tablet Take 10 mg by mouth daily.    . midodrine (PROAMATINE) 10 MG tablet Take 1 tablet (10 mg total) by mouth as directed. Twice a day on the day of HD Tuesday, Thursday, Saturday (1 in AM and 1 at [place]). Take 1/2 tablet (5mg ) a day on non HD days (Patient taking differently: Take 10 mg by mouth See admin instructions. Take 1 tablet (10 mg) by mouth 3 times daily on Tuesdays, Thursdays, & Saturdays.) 90 tablet 5  . montelukast (SINGULAIR) 10 MG tablet Take 1 tablet (10 mg total) by mouth at bedtime. 30 tablet 3  . multivitamin (RENA-VIT) TABS tablet Take 1 tablet by mouth daily at 2 PM.     . warfarin (COUMADIN) 5 MG tablet Take 1/2 to 1 tablet by mouth daily as directed by coumadin clinic (Patient taking differently: Take 2.5-5 mg by mouth See admin instructions. Take 1/2 tablet on Monday and Friday then take 1 tablet all the other days) 90 tablet 0   No current facility-administered medications for this visit.     ROS:   General:  No weight loss, Fever, chills  HEENT: No recent headaches, no nasal bleeding, no visual changes, no sore throat  Neurologic: No dizziness, blackouts, seizures. No recent symptoms of stroke or mini- stroke. No recent episodes of slurred speech, or temporary blindness.  Cardiac: No recent episodes of chest pain/pressure, no shortness of breath at rest.  No shortness of breath with exertion.  Denies history of atrial fibrillation or irregular heartbeat  Vascular: No history of rest pain in feet.  No history of claudication.  No history of non-healing ulcer, No history of  DVT   Pulmonary: No home oxygen, no productive cough, no hemoptysis,  No asthma or wheezing  Musculoskeletal:  [X]  Arthritis, [ ]  Low back pain,  [X]  Joint pain  Hematologic:No history of hypercoagulable state.  No history of easy bleeding.  No history of anemia  Gastrointestinal: No hematochezia or melena,  No gastroesophageal reflux, no trouble swallowing  Urinary: [X]  chronic Kidney disease, [X]  on HD - [ ]  MWF or [X]  TTHS, [ ]  Burning with urination, [ ]  Frequent urination, [ ]  Difficulty urinating;   Skin: No rashes  Psychological: No history of anxiety,  No history of depression   Physical Examination   Vitals:   10/16/19 1424  BP: 119/74  Pulse: 71  Resp: 20  Temp: 97.8 F (36.6 C)  SpO2: 99%  Weight: 186 lb (84.4 kg)  Height: 6\' 2"  (1.88 m)    General:  Alert and oriented, no acute distress HEENT: Normal Neck: No JVD Cardiac: Regular Rate and Rhythm Skin: No rash Extremity Pulses:  2+ radial, brachial, femoral, absent dorsalis pedis, posterior tibial pulses bilaterally Musculoskeletal: No deformity or edema  Neurologic: Upper and lower extremity motor 5/5 and symmetric  DATA:  Patient had bilateral ABIs performed today which were greater than 1 and triphasic bilaterally  ASSESSMENT: Patient with multiple failed upper extremity access procedures.  I believe his best option at this point will be a thigh graft rather than considering a hero graft since multiple prior  upper extremity procedures have completely failed.  Risk benefits possible complications and procedure details discussed with patient today including not limited to bleeding infection graft thrombosis possible limb ischemia.  He understands and agrees to proceed.  PLAN: Left thigh AV graft November 10, 2019.  We will need to stop his warfarin perioperatively.  Patient would prefer to go home same day if at all possible.  If this operation goes smoothly we will consider same-day discharge otherwise he may  need to spend 1 night in the hospital.   Ruta Hinds, MD [facility] [place]: [phone] Pager: [phone]

## 2019-10-18 DIAGNOSIS — Z992 Dependence on renal dialysis: Secondary | ICD-10-CM | POA: Diagnosis not present

## 2019-10-18 DIAGNOSIS — N186 End stage renal disease: Secondary | ICD-10-CM | POA: Diagnosis not present

## 2019-10-18 DIAGNOSIS — N2581 Secondary hyperparathyroidism of renal origin: Secondary | ICD-10-CM | POA: Diagnosis not present

## 2019-10-20 ENCOUNTER — Emergency Department (HOSPITAL_COMMUNITY)
Admission: EM | Admit: 2019-10-20 | Discharge: 2019-10-21 | Disposition: A | Discharge status: Home / Self Care | Payer: Medicare HMO | Attending: Emergency Medicine | Admitting: Emergency Medicine

## 2019-10-20 ENCOUNTER — Encounter (HOSPITAL_COMMUNITY): Payer: Self-pay | Admitting: Emergency Medicine

## 2019-10-20 ENCOUNTER — Other Ambulatory Visit: Payer: Self-pay

## 2019-10-20 DIAGNOSIS — N186 End stage renal disease: Secondary | ICD-10-CM | POA: Diagnosis not present

## 2019-10-20 DIAGNOSIS — J810 Acute pulmonary edema: Secondary | ICD-10-CM | POA: Diagnosis not present

## 2019-10-20 DIAGNOSIS — I132 Hypertensive heart and chronic kidney disease with heart failure and with stage 5 chronic kidney disease, or end stage renal disease: Secondary | ICD-10-CM | POA: Diagnosis not present

## 2019-10-20 DIAGNOSIS — R0902 Hypoxemia: Secondary | ICD-10-CM | POA: Diagnosis not present

## 2019-10-20 DIAGNOSIS — I120 Hypertensive chronic kidney disease with stage 5 chronic kidney disease or end stage renal disease: Secondary | ICD-10-CM | POA: Diagnosis not present

## 2019-10-20 DIAGNOSIS — D539 Nutritional anemia, unspecified: Secondary | ICD-10-CM | POA: Insufficient documentation

## 2019-10-20 DIAGNOSIS — I2510 Atherosclerotic heart disease of native coronary artery without angina pectoris: Secondary | ICD-10-CM | POA: Diagnosis not present

## 2019-10-20 DIAGNOSIS — Z87891 Personal history of nicotine dependence: Secondary | ICD-10-CM | POA: Diagnosis not present

## 2019-10-20 DIAGNOSIS — Z7901 Long term (current) use of anticoagulants: Secondary | ICD-10-CM | POA: Insufficient documentation

## 2019-10-20 DIAGNOSIS — R000 Tachycardia, unspecified: Secondary | ICD-10-CM | POA: Diagnosis not present

## 2019-10-20 DIAGNOSIS — Z79899 Other long term (current) drug therapy: Secondary | ICD-10-CM | POA: Insufficient documentation

## 2019-10-20 DIAGNOSIS — Z8546 Personal history of malignant neoplasm of prostate: Secondary | ICD-10-CM | POA: Diagnosis not present

## 2019-10-20 DIAGNOSIS — I10 Essential (primary) hypertension: Secondary | ICD-10-CM | POA: Diagnosis not present

## 2019-10-20 DIAGNOSIS — R0602 Shortness of breath: Secondary | ICD-10-CM | POA: Diagnosis not present

## 2019-10-20 DIAGNOSIS — Z20828 Contact with and (suspected) exposure to other viral communicable diseases: Secondary | ICD-10-CM | POA: Diagnosis not present

## 2019-10-20 DIAGNOSIS — G44209 Tension-type headache, unspecified, not intractable: Secondary | ICD-10-CM | POA: Diagnosis not present

## 2019-10-20 DIAGNOSIS — I5032 Chronic diastolic (congestive) heart failure: Secondary | ICD-10-CM | POA: Insufficient documentation

## 2019-10-20 DIAGNOSIS — E875 Hyperkalemia: Secondary | ICD-10-CM | POA: Diagnosis not present

## 2019-10-20 DIAGNOSIS — Z992 Dependence on renal dialysis: Secondary | ICD-10-CM | POA: Diagnosis not present

## 2019-10-20 NOTE — ED Triage Notes (Signed)
Pt BIB [facility] from home, c/o shortness of breath that started tonight. Hx dialysis Tues/Thurs/Sat, last treatment Saturday. Per EMS SpO2 96% on 6L. Denies chest pain/cough/fever.

## 2019-10-21 ENCOUNTER — Emergency Department (HOSPITAL_COMMUNITY): Payer: Medicare HMO

## 2019-10-21 DIAGNOSIS — R0602 Shortness of breath: Secondary | ICD-10-CM | POA: Diagnosis not present

## 2019-10-21 LAB — CBC WITH DIFFERENTIAL/PLATELET
Abs Immature Granulocytes: 0.03 10*3/uL (ref 0.00–0.07)
Basophils Absolute: 0.1 10*3/uL (ref 0.0–0.1)
Basophils Relative: 1 %
Eosinophils Absolute: 0.2 10*3/uL (ref 0.0–0.5)
Eosinophils Relative: 2 %
HCT: 36.3 % — ABNORMAL LOW (ref 39.0–52.0)
Hemoglobin: 12.1 g/dL — ABNORMAL LOW (ref 13.0–17.0)
Immature Granulocytes: 0 %
Lymphocytes Relative: 9 %
Lymphs Abs: 0.7 10*3/uL (ref 0.7–4.0)
MCH: 37.1 pg — ABNORMAL HIGH (ref 26.0–34.0)
MCHC: 33.3 g/dL (ref 30.0–36.0)
MCV: 111.3 fL — ABNORMAL HIGH (ref 80.0–100.0)
Monocytes Absolute: 0.6 10*3/uL (ref 0.1–1.0)
Monocytes Relative: 7 %
Neutro Abs: 6.8 10*3/uL (ref 1.7–7.7)
Neutrophils Relative %: 81 %
Platelets: 232 10*3/uL (ref 150–400)
RBC: 3.26 MIL/uL — ABNORMAL LOW (ref 4.22–5.81)
RDW: 14.1 % (ref 11.5–15.5)
WBC: 8.3 10*3/uL (ref 4.0–10.5)
nRBC: 0 % (ref 0.0–0.2)

## 2019-10-21 LAB — BASIC METABOLIC PANEL
Anion gap: 17 — ABNORMAL HIGH (ref 5–15)
BUN: 63 mg/dL — ABNORMAL HIGH (ref 8–23)
CO2: 22 mmol/L (ref 22–32)
Calcium: 9.1 mg/dL (ref 8.9–10.3)
Chloride: 100 mmol/L (ref 98–111)
Creatinine, Ser: 11.49 mg/dL — ABNORMAL HIGH (ref 0.61–1.24)
GFR calc Af Amer: 4 mL/min — ABNORMAL LOW (ref >60)
GFR calc non Af Amer: 4 mL/min — ABNORMAL LOW (ref >60)
Glucose, Bld: 104 mg/dL — ABNORMAL HIGH (ref 70–99)
Potassium: 5.6 mmol/L — ABNORMAL HIGH (ref 3.5–5.1)
Sodium: 139 mmol/L (ref 135–145)

## 2019-10-21 LAB — SARS CORONAVIRUS 2 (TAT 6-24 HRS): SARS Coronavirus 2: NEGATIVE

## 2019-10-21 MED ORDER — DEXTROSE 50 % IV SOLN: 50.0000 mL | Freq: Once | INTRAVENOUS | Status: DC

## 2019-10-21 MED ORDER — CHLORHEXIDINE GLUCONATE CLOTH 2 % EX PADS: 6.0000 | MEDICATED_PAD | Freq: Every day | CUTANEOUS | Status: DC

## 2019-10-21 MED ORDER — HEPARIN SODIUM (PORCINE) 1000 UNIT/ML IJ SOLN
INTRAMUSCULAR | Status: AC
  Administered 2019-10-21: 12:00:00
  Filled 2019-10-21: qty 4

## 2019-10-21 MED ORDER — HEPARIN BOLUS VIA INFUSION: 3400.0000 [IU] | Freq: Once | INTRAVENOUS | Status: DC

## 2019-10-21 NOTE — ED Notes (Signed)
Patient returned from dialysis needs to be seen by ED doctor. Patient has not complaints has dialysis on T, Th, Sat.

## 2019-10-21 NOTE — ED Provider Notes (Signed)
[facility] EMERGENCY DEPARTMENT Provider Note   CSN: [id number] Arrival date & time: 10/20/19  2347    History   Chief Complaint Chief Complaint  Patient presents with   Shortness of Breath    HPI David [name] is a 78 y.o. male.   The history is provided by the patient.  Shortness of Breath He has history of combined systolic and diastolic heart failure, end-stage renal disease on hemodialysis, HIV infection, prostate cancer and comes in with difficulty breathing which started about 10:30 PM.  There is a cough productive of sputum but he does not look at the sputum and does not know the color.  He denies fever, chills, sweats.  He denies chest pain, heaviness, tightness, pressure.  There has been no nausea, vomiting, diaphoresis.  He was exposed to a family member who tested positive for COVID-19.  He is on dialysis every Tuesday-Thursday-Saturday and is due for dialysis in the morning.  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   Acute on chronic systolic and diastolic heart failure, NYHA class 4 (HCC)    Anemia, iron deficiency 11/15/2011   Arthritis    "hands, right knee, feet" (02/21/2018)   Atrial fibrillation (HCC)    Cancer (HCC)    hx of prostate; s/p radioactive seed implant 10/2009 Dr Janice Norrie   Cardiac arrest [facility]) 02/17/2018   CHF (congestive heart failure) (HCC)    Chronic combined systolic and diastolic heart failure, NYHA class 3 ([place]) 06/2010   felt to be secondary to hypertensive cardiomyopathy   Chronic lower back pain    CKD (chronic kidney disease) stage V requiring chronic dialysis ([place])    ESRD on dialysis ([place])    started 02/2018 [place]   GERD (gastroesophageal reflux disease)    Glaucoma    "I had surgery and dont have it any more"   Gout    daily RX (02/21/2018)   Heart murmur    "mild" per pt   Hepatitis    years ago   HIV (human  immunodeficiency virus infection) ([place])    HIV infection ([place])    Hyperlipidemia    Hypertension    followed by [facility] and Vascular (Dr Dani Gobble Croitoru)   Hypertension    Nonischemic cardiomyopathy ([place])    Pneumonia 11/2017   Prostate cancer ([place])    Sinus bradycardia    Sleep apnea    does not use a cpap   Stroke ([place])    "mini stroke" years ago    Patient Active Problem List   Diagnosis Date Noted   Hyperkalemia 09/02/2019   Unspecified open wound of right upper arm, initial encounter 06/21/2019   Shortness of breath 04/12/2019   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   Dependence on renal dialysis ([place]) 03/27/2019   Hypertensive heart and chronic kidney disease with heart failure and with stage 5 chronic kidney disease, or end stage renal disease ([place]) 03/27/2019   Mesenteric ischemia (HCC)    Colonic ulcer    Right lower quadrant pain    Abnormal CT of the abdomen    Ischemic colitis ([place])    Colitis presumed infectious 03/09/2019   Acute respiratory failure with hypoxia and hypercapnia ([place]) 03/04/2019   Pruritus, unspecified 02/21/2019   Acute pulmonary edema ([place]) 02/11/2019   Pain, unspecified 01/21/2019   Atrial fibrillation with RVR ([place]) 12/05/2018   Paroxysmal atrial fibrillation ([place]) 11/16/2018   LBBB (left bundle branch block) 11/16/2018  Encounter for monitoring amiodarone therapy 11/16/2018   Volume overload 11/01/2018   Pulmonary edema 10/21/2018   Gout 10/21/2018   Hypertensive emergency 10/21/2018   Chronic combined systolic and diastolic heart failure, NYHA class 3 ([place]) 10/21/2018   Leukocytosis 10/21/2018   Chills (without fever) 09/24/2018   Gastroesophageal reflux disease 08/28/2018   Chronic anticoagulation 04/03/2018   CAD (coronary artery disease) 04/03/2018   Palliative care encounter 03/27/2018   Atrial fibrillation, chronic ([place]) 03/25/2018   Anemia in chronic kidney disease 03/12/2018     Coagulation defect, unspecified ([place]) 03/12/2018   Secondary hyperparathyroidism of renal origin ([name]) 03/12/2018   Palliative care by specialist    Advance care planning    Goals of care, counseling/discussion    Respiratory arrest ([place])    Acute on chronic systolic heart failure ([place])    Chronic obstructive pulmonary disease ([place])    HIV (human immunodeficiency virus infection) ([place])    ESRD (end stage renal disease) ([place])    Acute respiratory failure with hypoxia ([place]) 01/21/2018   Pneumonia    Aspiration pneumonia of both lower lobes due to regurgitated food ([place])    Sepsis due to pneumonia ([place]) 11/22/2017   AIDS ([place]) 05/14/2014   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   Gastroparesis 05/06/2014   Protein-calorie malnutrition, severe ([place]) 05/05/2014   ESRD on hemodialysis ([place]) 05/02/2014   Hemodialysis-associated hypotension 05/02/2014   Leaking of conjunctival drainage bleb 05/01/2014   POAG (primary open-angle glaucoma) 05/01/2014   Loss of weight 04/29/2014   Conjunctivitis 04/29/2014   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   Low back pain 04/09/2012   Osteoarthritis of hip 12/29/2011   Iron deficiency anemia 06/28/2011   Macrocytic anemia 04/02/2011   ADENOCARCINOMA, PROSTATE, 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   Hyperlipidemia 11/30/2010   Transient cerebral ischemia 11/30/2010   HYPERGLYCEMIA 11/30/2010    Past Surgical History:  Procedure Laterality Date   AV FISTULA PLACEMENT Right 10/13/2016   Procedure: ARTERIOVENOUS (AV) FISTULA CREATION;  Surgeon: [name], MD;  Location: [facility];  Service: Vascular;  Laterality: Right;   AV FISTULA PLACEMENT Left 02/25/2018   Procedure: INSERTION OF ARTERIOVENOUS (AV) GORE-TEX GRAFT LEFT UPPER ARM;  Surgeon: Angelia Mould, MD;  Location: [facility];  Service: Vascular;  Laterality: Left;   AV FISTULA PLACEMENT Right 08/07/2018   Procedure: Creation of right arm brachiocephalic Fistula;   Surgeon: Waynetta Sandy, MD;  Location: [facility];  Service: Vascular;  Laterality: Right;   [place] Left 06/24/2015   Procedure: BASILIC VEIN TRANSPOSITION;  Surgeon: Mal Misty, MD;  Location: [facility];  Service: Vascular;  Laterality: Left;   BIOPSY  03/14/2019   Procedure: BIOPSY;  Surgeon: Irene Shipper, MD;  Location: [facility] ENDOSCOPY;  Service: Endoscopy;;   CATARACT EXTRACTION, BILATERAL Bilateral    COLONOSCOPY WITH PROPOFOL N/A 03/14/2019   Procedure: COLONOSCOPY WITH PROPOFOL;  Surgeon: Irene Shipper, MD;  Location: [facility];  Service: Endoscopy;  Laterality: N/A;   ESOPHAGOGASTRODUODENOSCOPY (EGD) WITH PROPOFOL N/A 05/05/2014   Procedure: ESOPHAGOGASTRODUODENOSCOPY (EGD) WITH PROPOFOL;  Surgeon: Missy Sabins, MD;  Location: [facility] ENDOSCOPY;  Service: Endoscopy;  Laterality: N/A;   EYE SURGERY Bilateral    cataract surgery    GLAUCOMA SURGERY Bilateral    INSERTION OF ARTERIOVENOUS (AV) ARTEGRAFT ARM  10/09/2018   Procedure: INSERTION OF ARTERIOVENOUS (AV) 16mm x 41cm ARTEGRAFT LEFT UPPER ARM;  Surgeon: Waynetta Sandy, MD;  Location: [facility];  Service: Vascular;;   INSERTION OF DIALYSIS CATHETER Right  07/14/2019   Procedure: Insertion Of Dialysis Catheter;  Surgeon: Elam Dutch, MD;  Location: [facility] OR;  Service: Vascular;  Laterality: Right;  placed right Internal Jugular   INSERTION PROSTATE RADIATION SEED     IR FLUORO GUIDE CV LINE RIGHT  02/18/2018   IR REMOVAL TUN CV CATH W/O FL  12/06/2018   IR US GUIDE VASC ACCESS RIGHT  02/18/2018   LEFT HEART CATH AND CORONARY ANGIOGRAPHY N/A 02/20/2018   Procedure: LEFT HEART CATH AND CORONARY ANGIOGRAPHY;  Surgeon: Jettie Booze, MD;  Location: [facility] CV LAB;;; 50% ostial OM2, 25% mLAD.   LEFT HEART CATH AND CORONARY ANGIOGRAPHY  2004   mildly depressed LV systolic fx EF 75%,FFMBWG coronaries/abdominal aorta/renal arteries.   NM MYOCAR PERF WALL MOTION  02/21/2010   No ischemia or  infarction.  EF 27%.   RADIOACTIVE SEED IMPLANT  2010   prostate cancer   REVISION OF ARTERIOVENOUS GORETEX GRAFT Right 07/11/2019   Procedure: REVISION OF ARTERIOVENOUS GORETEX GRAFT RIGHT ARM WITH ARTEGRAFT;  Surgeon: Serafina [place], MD;  Location: [facility];  Service: Vascular;  Laterality: Right;   SHUNTOGRAM Right 07/14/2019   Procedure: Earney Mallet;  Surgeon: Elam Dutch, MD;  Location: [facility];  Service: Vascular;  Laterality: Right;   THROMBECTOMY AND REVISION OF ARTERIOVENTOUS (AV) GORETEX  GRAFT Right 07/14/2019   Procedure: THROMBECTOMY AND REVISION OF ARTERIOVENTOUS (AV) GORETEX  GRAFT;  Surgeon: Elam Dutch, MD;  Location: [facility];  Service: Vascular;  Laterality: Right;   THROMBECTOMY W/ EMBOLECTOMY Left 02/26/2018   Procedure: THROMBECTOMY ARTERIOVENOUS GRAFT;  Surgeon: Angelia Mould, MD;  Location: [facility];  Service: Vascular;  Laterality: Left;   TRANSTHORACIC ECHOCARDIOGRAM  02/2012   ([place]) EF 45-50% with mild global HK.  Mild LVH,LA mod. dilated,mild-mod. 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 AI.   TRANSTHORACIC ECHOCARDIOGRAM  9/'18; 3/'19   a) Severely reduced EF.  25 from 30%.  GR 1 DD with diffuse ecchymosis. Mild AS & AI.  Mild LA/RA dilation; b) (post PEA arest):   Moderately dilated LV with moderate concentric virtually.  EF 15 to 20%.  GR 1 DD.  Paradoxical septal motion. Mild AI.  Mild LA dilation.  Poorly visualized RV.   TRANSTHORACIC ECHOCARDIOGRAM  02/2019   EF 20-25%.  Unable to assess diastolic function (A. Fib).  No LV apical thrombus.  Mild AI.  Mildly reduced RV function, however poorly visualized.   ULTRASOUND GUIDANCE FOR VASCULAR ACCESS  02/20/2018   Procedure: Ultrasound Guidance For Vascular Access;  Surgeon: Jettie Booze, MD;  Location: [facility] CV LAB;  Service: Cardiovascular;;   UPPER EXTREMITY VENOGRAPHY N/A 07/08/2018   Procedure: UPPER EXTREMITY VENOGRAPHY;  Surgeon: Waynetta Sandy,  MD;  Location: [facility] CV LAB;  Service: Cardiovascular;  Laterality: N/A;  Bilateral        Home Medications    Prior to Admission medications   Medication Sig Start Date End Date Taking? Authorizing Provider  acetaminophen (TYLENOL) 500 MG tablet Take 1,000 mg by mouth every 6 (six) hours as needed for moderate pain.    [provider]  allopurinol (ZYLOPRIM) 100 MG tablet 1 tablet by mouth 3x weekly after dialysis. 09/15/19   [name], NP  amiodarone (PACERONE) 200 MG tablet Take 1 tablet (200 mg total) by mouth daily. 05/12/19   Croitoru, Mihai, MD  amoxicillin (AMOXIL) 500 MG capsule Take QDAILY in the am, then on dialysis days take ANOTHER dose AFTER  HD Patient taking differently: Take 500-1,000 mg by mouth See admin instructions. Take 1 capsule every day and take 2 capsules on days of dialysis 04/02/19   Tommy Medal, Lavell Islam, MD  bictegravir-emtricitabine-tenofovir AF ([name]) 50-200-25 MG TABS tablet Take 1 tablet by mouth daily. 09/29/19   Truman Hayward, MD  brimonidine (ALPHAGAN) 0.15 % ophthalmic solution Place 1 drop into the left eye 2 (two) times daily. 11/15/17   [provider]  dorzolamide-timolol (COSOPT) 22.3-6.8 MG/ML ophthalmic solution Place 1 drop into the left eye 2 (two) times daily. 12/17/17   [provider]  ferric citrate (AURYXIA) 1 GM 210 MG(Fe) tablet Take 420 mg by mouth 3 (three) times daily with meals.    [provider]  latanoprost (XALATAN) 0.005 % ophthalmic solution Place 1 drop into the left eye at bedtime.     [provider]  loratadine (CLARITIN) 10 MG tablet Take 10 mg by mouth daily.    [provider]  midodrine (PROAMATINE) 10 MG tablet Take 1 tablet (10 mg total) by mouth as directed. Twice a day on the day of HD Tuesday, Thursday, Saturday (1 in AM and 1 at [place]). Take 1/2 tablet (5mg ) a day on non HD days Patient taking differently: Take 10 mg by mouth See admin  instructions. Take 1 tablet (10 mg) by mouth 3 times daily on Tuesdays, Thursdays, & Saturdays. 04/22/19   Leonie Man, MD  montelukast (SINGULAIR) 10 MG tablet Take 1 tablet (10 mg total) by mouth at bedtime. 09/12/19   [name], NP  multivitamin (RENA-VIT) TABS tablet Take 1 tablet by mouth daily at 2 PM.     [provider]  VELPHORO 500 MG chewable tablet  09/29/19   [provider]  warfarin (COUMADIN) 5 MG tablet Take 1/2 to 1 tablet by mouth daily as directed by coumadin clinic Patient taking differently: Take 2.5-5 mg by mouth See admin instructions. Take 1/2 tablet on Monday and Friday then take 1 tablet all the other days 07/25/19   Leonie Man, MD    Family History Family History  Problem Relation Age of Onset   Hypertension Mother    Thyroid disease Mother    Cholelithiasis Daughter    Cholelithiasis Son    Hypertension Maternal Grandmother    Diabetes Maternal Grandmother    Heart attack Neg Hx    Hyperlipidemia Neg Hx     Social History Social History   Tobacco Use   Smoking status: Former Smoker    Packs/day: 1.00    Years: 30.00    Pack years: 30.00    Types: Cigarettes    Quit date: 2006    Years since quitting: 14.8   Smokeless tobacco: Never Used  Substance Use Topics   Alcohol use: Yes    Alcohol/week: 1.0 standard drinks    Types: 1 Shots of liquor per week   Drug use: Never     Allergies   Dextromethorphan-guaifenesin, Tocotrienols, and Losartan potassium   Review of Systems Review of Systems  Respiratory: Positive for shortness of breath.   All other systems reviewed and are negative.    Physical Exam Updated Vital Signs BP (!) 155/89    Pulse 94    Temp 98.8 F (37.1 C) (Oral)    Resp (!) 23    SpO2 100%   Physical Exam Vitals signs and nursing note reviewed.    78 year old male, appears dyspneic at rest but is not using  accessory muscles of respiration. Vital signs are significant for  elevated blood pressure and respiratory rate. Oxygen saturation is 90%, which is hypoxic. Head is normocephalic and atraumatic. PERRLA, EOMI. Oropharynx is clear. Neck is nontender and supple without adenopathy or JVD. Back is nontender and there is no CVA tenderness. Lungs have diffuse rales with coarse inspiratory and expiratory wheezes throughout.  There are no rhonchi. Chest is nontender.  Dialysis tunnel catheter present right subclavian area Heart has regular rate and rhythm without murmur. Abdomen is soft, flat, nontender without masses or hepatosplenomegaly and peristalsis is normoactive. Extremities have no cyanosis or edema, full range of motion is present. Skin is warm and dry without rash. Neurologic: Mental status is normal, cranial nerves are intact, there are no motor or sensory deficits.  ED Treatments / Results  Labs (all labs ordered are listed, but only abnormal results are displayed) Labs Reviewed  BASIC METABOLIC PANEL - Abnormal; Notable for the following components:      Result Value   Potassium 5.6 (*)    Glucose, Bld 104 (*)    BUN 63 (*)    Creatinine, Ser 11.49 (*)    GFR calc non Af Amer 4 (*)    GFR calc Af Amer 4 (*)    Anion gap 17 (*)    All other components within normal limits  CBC WITH DIFFERENTIAL/PLATELET - Abnormal; Notable for the following components:   RBC 3.26 (*)    Hemoglobin 12.1 (*)    HCT 36.3 (*)    MCV 111.3 (*)    MCH 37.1 (*)    All other components within normal limits  SARS CORONAVIRUS 2 (TAT 6-24 HRS)    EKG EKG Interpretation  Date/Time:  Tuesday October 21 2019 00:21:31 EST Ventricular Rate:  122 PR Interval:    QRS Duration: 177 QT Interval:  408 QTC Calculation: 582 R Axis:   -41 Text Interpretation: Age not entered, assumed to be  78 years old for purpose of ECG interpretation Sinus tachycardia Left bundle branch block When compared with ECG of 09/02/2019, No significant change was found Confirmed by Delora Fuel  ([id number]) on 10/21/2019 12:31:05 AM   Radiology Dg Chest Port 1 View  Result Date: 10/21/2019 CLINICAL DATA:  78 year old male with shortness of breath. Dialysis patient. EXAM: PORTABLE CHEST 1 VIEW COMPARISON:  Portable chest 09/02/2019 and earlier. FINDINGS: Portable AP semi upright view at 0018 hours. Stable tunneled right chest dialysis catheter. Stable cardiomegaly and mediastinal contours. Stable lung volumes. Indistinct pulmonary vasculature with basilar predominant hazy and interstitial opacity not significantly changed compared to 09/02/2019. No superimposed pneumothorax or pleural effusion. No definite consolidation. No acute osseous abnormality identified. Negative visible bowel gas pattern. IMPRESSION: Basilar predominant pulmonary interstitial opacity not significantly changed compared to 09/02/2019 and favored due to pulmonary edema. Electronically Signed   By: Genevie Ann M.D.   On: 10/21/2019 00:36    Procedures Procedures  CRITICAL CARE Performed by: Delora Fuel Total critical care time: 50 minutes Critical care time was exclusive of separately billable procedures and treating other patients. Critical care was necessary to treat or prevent imminent or life-threatening deterioration. Critical care was time spent personally by me on the following activities: development of treatment plan with patient and/or surrogate as well as nursing, discussions with consultants, evaluation of patient's response to treatment, examination of patient, obtaining history from patient or surrogate, ordering and performing treatments and interventions, ordering and review of laboratory studies, ordering and review of radiographic studies, pulse  oximetry and re-evaluation of patient's condition.  Medications Ordered in ED Medications - No data to display   Initial Impression / Assessment and Plan / ED Course  I have reviewed the triage vital signs and the nursing notes.  Pertinent labs & imaging  results that were available during my care of the patient were reviewed by me and considered in my medical decision making (see chart for details).  Dyspnea which seems most likely to be acute pulmonary edema based on exam.  Will check chest x-ray and screening labs.  Old records are reviewed, and he does have prior hospitalizations for acute pulmonary edema.  He will need urgent dialysis.  Chest x-ray is much more consistent with pulmonary edema than COVID-19.  Labs show mild hyperkalemia.  ECG is unchanged from baseline, no ECG evidence of significant hyperkalemia.  Case is discussed with Dr. Royce Macadamia of nephrology service who agrees to take the patient for urgent dialysis.  [facility]ieger was evaluated in Emergency Department on 10/21/2019 for the symptoms described in the history of present illness. He was evaluated in the context of the global COVID-19 pandemic, which necessitated consideration that the patient might be at risk for infection with the SARS-CoV-2 virus that causes COVID-19. Institutional protocols and algorithms that pertain to the evaluation of patients at risk for COVID-19 are in a state of rapid change based on information released by regulatory bodies including the CDC and federal and state organizations. These policies and algorithms were followed during the patient's care in the ED.  Final Clinical Impressions(s) / ED Diagnoses   Final diagnoses:  Muscle contraction headache  Acute pulmonary edema (HCC)  Hyperkalemia  End-stage renal disease on hemodialysis ([place])  Macrocytic anemia    ED Discharge Orders    None       Delora Fuel, MD 35/70/17 [phone]

## 2019-10-21 NOTE — Procedures (Signed)
Patient was seen on dialysis and the procedure was supervised.  BFR 300  Via TDC BP is  111/74.   Patient appears to be tolerating treatment well  Pt tells me that he used to run MTTS because he kept having issues over the weekend.  EDW recently increased at OP center due to cramping.  Will try for 2.5 kg here today and likely will need lower EDW at discharge.  Have not pulled the trigger on 4 days a week but will need to be watched   Louis Meckel 10/21/2019

## 2019-10-23 DIAGNOSIS — N186 End stage renal disease: Secondary | ICD-10-CM | POA: Diagnosis not present

## 2019-10-23 DIAGNOSIS — N2581 Secondary hyperparathyroidism of renal origin: Secondary | ICD-10-CM | POA: Diagnosis not present

## 2019-10-23 DIAGNOSIS — Z992 Dependence on renal dialysis: Secondary | ICD-10-CM | POA: Diagnosis not present

## 2019-10-23 LAB — POCT INR: INR: 2 (ref 2.0–3.0)

## 2019-10-25 DIAGNOSIS — N2581 Secondary hyperparathyroidism of renal origin: Secondary | ICD-10-CM | POA: Diagnosis not present

## 2019-10-25 DIAGNOSIS — Z992 Dependence on renal dialysis: Secondary | ICD-10-CM | POA: Diagnosis not present

## 2019-10-25 DIAGNOSIS — N186 End stage renal disease: Secondary | ICD-10-CM | POA: Diagnosis not present

## 2019-10-27 ENCOUNTER — Ambulatory Visit (INDEPENDENT_AMBULATORY_CARE_PROVIDER_SITE_OTHER): Payer: Medicare HMO | Admitting: Cardiology

## 2019-10-27 DIAGNOSIS — I482 Chronic atrial fibrillation, unspecified: Secondary | ICD-10-CM | POA: Diagnosis not present

## 2019-10-27 DIAGNOSIS — Z515 Encounter for palliative care: Secondary | ICD-10-CM

## 2019-10-27 DIAGNOSIS — Z992 Dependence on renal dialysis: Secondary | ICD-10-CM | POA: Diagnosis not present

## 2019-10-27 DIAGNOSIS — N2581 Secondary hyperparathyroidism of renal origin: Secondary | ICD-10-CM | POA: Diagnosis not present

## 2019-10-27 DIAGNOSIS — N186 End stage renal disease: Secondary | ICD-10-CM | POA: Diagnosis not present

## 2019-10-28 LAB — POCT INR: INR: 3.3 — AB (ref 2.0–3.0)

## 2019-10-29 ENCOUNTER — Ambulatory Visit (INDEPENDENT_AMBULATORY_CARE_PROVIDER_SITE_OTHER): Payer: Medicare HMO | Admitting: Cardiovascular Disease

## 2019-10-29 DIAGNOSIS — I482 Chronic atrial fibrillation, unspecified: Secondary | ICD-10-CM

## 2019-10-29 DIAGNOSIS — Z515 Encounter for palliative care: Secondary | ICD-10-CM

## 2019-10-29 DIAGNOSIS — N2581 Secondary hyperparathyroidism of renal origin: Secondary | ICD-10-CM | POA: Diagnosis not present

## 2019-10-29 DIAGNOSIS — Z992 Dependence on renal dialysis: Secondary | ICD-10-CM | POA: Diagnosis not present

## 2019-10-29 DIAGNOSIS — N186 End stage renal disease: Secondary | ICD-10-CM | POA: Diagnosis not present

## 2019-11-01 ENCOUNTER — Other Ambulatory Visit: Payer: Self-pay

## 2019-11-01 ENCOUNTER — Emergency Department (HOSPITAL_COMMUNITY): Payer: Medicare HMO

## 2019-11-01 ENCOUNTER — Other Ambulatory Visit: Payer: Self-pay | Admitting: Cardiology

## 2019-11-01 ENCOUNTER — Observation Stay (HOSPITAL_COMMUNITY)
Admission: EM | Admit: 2019-11-01 | Discharge: 2019-11-02 | Disposition: A | Discharge status: Home / Self Care | Payer: Medicare HMO | Attending: Internal Medicine | Admitting: Internal Medicine

## 2019-11-01 ENCOUNTER — Encounter (HOSPITAL_COMMUNITY): Payer: Self-pay | Admitting: *Deleted

## 2019-11-01 DIAGNOSIS — D509 Iron deficiency anemia, unspecified: Secondary | ICD-10-CM | POA: Diagnosis not present

## 2019-11-01 DIAGNOSIS — Z8546 Personal history of malignant neoplasm of prostate: Secondary | ICD-10-CM | POA: Insufficient documentation

## 2019-11-01 DIAGNOSIS — Z87891 Personal history of nicotine dependence: Secondary | ICD-10-CM | POA: Insufficient documentation

## 2019-11-01 DIAGNOSIS — Z79899 Other long term (current) drug therapy: Secondary | ICD-10-CM | POA: Insufficient documentation

## 2019-11-01 DIAGNOSIS — B20 Human immunodeficiency virus [HIV] disease: Secondary | ICD-10-CM | POA: Diagnosis not present

## 2019-11-01 DIAGNOSIS — I959 Hypotension, unspecified: Secondary | ICD-10-CM | POA: Diagnosis not present

## 2019-11-01 DIAGNOSIS — I120 Hypertensive chronic kidney disease with stage 5 chronic kidney disease or end stage renal disease: Secondary | ICD-10-CM | POA: Diagnosis not present

## 2019-11-01 DIAGNOSIS — J9601 Acute respiratory failure with hypoxia: Secondary | ICD-10-CM | POA: Diagnosis not present

## 2019-11-01 DIAGNOSIS — J810 Acute pulmonary edema: Secondary | ICD-10-CM

## 2019-11-01 DIAGNOSIS — E8889 Other specified metabolic disorders: Secondary | ICD-10-CM | POA: Insufficient documentation

## 2019-11-01 DIAGNOSIS — R0902 Hypoxemia: Secondary | ICD-10-CM | POA: Diagnosis not present

## 2019-11-01 DIAGNOSIS — Z992 Dependence on renal dialysis: Secondary | ICD-10-CM

## 2019-11-01 DIAGNOSIS — Z7901 Long term (current) use of anticoagulants: Secondary | ICD-10-CM | POA: Insufficient documentation

## 2019-11-01 DIAGNOSIS — I10 Essential (primary) hypertension: Secondary | ICD-10-CM | POA: Diagnosis not present

## 2019-11-01 DIAGNOSIS — Z8673 Personal history of transient ischemic attack (TIA), and cerebral infarction without residual deficits: Secondary | ICD-10-CM | POA: Diagnosis not present

## 2019-11-01 DIAGNOSIS — Z20828 Contact with and (suspected) exposure to other viral communicable diseases: Secondary | ICD-10-CM | POA: Diagnosis not present

## 2019-11-01 DIAGNOSIS — Z21 Asymptomatic human immunodeficiency virus [HIV] infection status: Secondary | ICD-10-CM | POA: Diagnosis not present

## 2019-11-01 DIAGNOSIS — Z8674 Personal history of sudden cardiac arrest: Secondary | ICD-10-CM | POA: Insufficient documentation

## 2019-11-01 DIAGNOSIS — N186 End stage renal disease: Secondary | ICD-10-CM | POA: Diagnosis not present

## 2019-11-01 DIAGNOSIS — I132 Hypertensive heart and chronic kidney disease with heart failure and with stage 5 chronic kidney disease, or end stage renal disease: Secondary | ICD-10-CM | POA: Diagnosis not present

## 2019-11-01 DIAGNOSIS — I447 Left bundle-branch block, unspecified: Secondary | ICD-10-CM | POA: Diagnosis not present

## 2019-11-01 DIAGNOSIS — G4730 Sleep apnea, unspecified: Secondary | ICD-10-CM | POA: Diagnosis not present

## 2019-11-01 DIAGNOSIS — I480 Paroxysmal atrial fibrillation: Secondary | ICD-10-CM | POA: Diagnosis present

## 2019-11-01 DIAGNOSIS — R000 Tachycardia, unspecified: Secondary | ICD-10-CM | POA: Diagnosis not present

## 2019-11-01 DIAGNOSIS — E8770 Fluid overload, unspecified: Secondary | ICD-10-CM | POA: Diagnosis present

## 2019-11-01 DIAGNOSIS — I5042 Chronic combined systolic (congestive) and diastolic (congestive) heart failure: Secondary | ICD-10-CM | POA: Insufficient documentation

## 2019-11-01 DIAGNOSIS — R0602 Shortness of breath: Secondary | ICD-10-CM | POA: Diagnosis not present

## 2019-11-01 DIAGNOSIS — I420 Dilated cardiomyopathy: Secondary | ICD-10-CM | POA: Diagnosis not present

## 2019-11-01 DIAGNOSIS — E875 Hyperkalemia: Secondary | ICD-10-CM | POA: Diagnosis present

## 2019-11-01 LAB — CBC WITH DIFFERENTIAL/PLATELET
Abs Immature Granulocytes: 0.03 10*3/uL (ref 0.00–0.07)
Basophils Absolute: 0.1 10*3/uL (ref 0.0–0.1)
Basophils Relative: 1 %
Eosinophils Absolute: 0.6 10*3/uL — ABNORMAL HIGH (ref 0.0–0.5)
Eosinophils Relative: 6 %
HCT: 36 % — ABNORMAL LOW (ref 39.0–52.0)
Hemoglobin: 11.8 g/dL — ABNORMAL LOW (ref 13.0–17.0)
Immature Granulocytes: 0 %
Lymphocytes Relative: 16 %
Lymphs Abs: 1.6 10*3/uL (ref 0.7–4.0)
MCH: 36.4 pg — ABNORMAL HIGH (ref 26.0–34.0)
MCHC: 32.8 g/dL (ref 30.0–36.0)
MCV: 111.1 fL — ABNORMAL HIGH (ref 80.0–100.0)
Monocytes Absolute: 0.6 10*3/uL (ref 0.1–1.0)
Monocytes Relative: 6 %
Neutro Abs: 7.1 10*3/uL (ref 1.7–7.7)
Neutrophils Relative %: 71 %
Platelets: 192 10*3/uL (ref 150–400)
RBC: 3.24 MIL/uL — ABNORMAL LOW (ref 4.22–5.81)
RDW: 14.3 % (ref 11.5–15.5)
WBC: 10 10*3/uL (ref 4.0–10.5)
nRBC: 0 % (ref 0.0–0.2)

## 2019-11-01 LAB — BASIC METABOLIC PANEL
Anion gap: 18 — ABNORMAL HIGH (ref 5–15)
BUN: 56 mg/dL — ABNORMAL HIGH (ref 8–23)
CO2: 22 mmol/L (ref 22–32)
Calcium: 10.1 mg/dL (ref 8.9–10.3)
Chloride: 103 mmol/L (ref 98–111)
Creatinine, Ser: 11.92 mg/dL — ABNORMAL HIGH (ref 0.61–1.24)
GFR calc Af Amer: 4 mL/min — ABNORMAL LOW (ref >60)
GFR calc non Af Amer: 4 mL/min — ABNORMAL LOW (ref >60)
Glucose, Bld: 125 mg/dL — ABNORMAL HIGH (ref 70–99)
Potassium: 5.7 mmol/L — ABNORMAL HIGH (ref 3.5–5.1)
Sodium: 143 mmol/L (ref 135–145)

## 2019-11-01 LAB — POTASSIUM: Potassium: 5.9 mmol/L — ABNORMAL HIGH (ref 3.5–5.1)

## 2019-11-01 LAB — POCT I-STAT EG7
Acid-Base Excess: 4 mmol/L — ABNORMAL HIGH (ref 0.0–2.0)
Bicarbonate: 28 mmol/L (ref 20.0–28.0)
Calcium, Ion: 1.16 mmol/L (ref 1.15–1.40)
HCT: 37 % — ABNORMAL LOW (ref 39.0–52.0)
Hemoglobin: 12.6 g/dL — ABNORMAL LOW (ref 13.0–17.0)
O2 Saturation: 100 %
Potassium: 5.3 mmol/L — ABNORMAL HIGH (ref 3.5–5.1)
Sodium: 141 mmol/L (ref 135–145)
TCO2: 29 mmol/L (ref 22–32)
pCO2, Ven: 40.8 mmHg — ABNORMAL LOW (ref 44.0–60.0)
pH, Ven: 7.444 — ABNORMAL HIGH (ref 7.250–7.430)
pO2, Ven: 200 mmHg — ABNORMAL HIGH (ref 32.0–45.0)

## 2019-11-01 LAB — PROTIME-INR
INR: 1.8 — ABNORMAL HIGH (ref 0.8–1.2)
Prothrombin Time: 20.5 seconds — ABNORMAL HIGH (ref 11.4–15.2)

## 2019-11-01 LAB — BRAIN NATRIURETIC PEPTIDE: B Natriuretic Peptide: 795.5 pg/mL — ABNORMAL HIGH (ref 0.0–100.0)

## 2019-11-01 LAB — SARS CORONAVIRUS 2 (TAT 6-24 HRS): SARS Coronavirus 2: NEGATIVE

## 2019-11-01 LAB — POC SARS CORONAVIRUS 2 AG -  ED: SARS Coronavirus 2 Ag: NEGATIVE

## 2019-11-01 MED ORDER — MIDODRINE HCL 5 MG PO TABS
ORAL_TABLET | ORAL | Status: AC
  Filled 2019-11-01: qty 2

## 2019-11-01 MED ORDER — MIDODRINE HCL 5 MG PO TABS
10.0000 mg | ORAL_TABLET | Freq: Once | ORAL | Status: AC | PRN
  Administered 2019-11-01: 10 mg via ORAL
  Filled 2019-11-01: qty 2

## 2019-11-01 MED ORDER — MIDODRINE HCL 5 MG PO TABS: 10.0000 mg | ORAL_TABLET | ORAL | Status: DC

## 2019-11-01 MED ORDER — CHLORHEXIDINE GLUCONATE CLOTH 2 % EX PADS
6.0000 | MEDICATED_PAD | Freq: Every day | CUTANEOUS | Status: DC
  Administered 2019-11-02: 6 via TOPICAL

## 2019-11-01 MED ORDER — SODIUM CHLORIDE 0.9 % IV SOLN: 100.0000 mL | INTRAVENOUS | Status: DC | PRN

## 2019-11-01 MED ORDER — HYDROCODONE-ACETAMINOPHEN 5-325 MG PO TABS: 1.0000 | ORAL_TABLET | Freq: Four times a day (QID) | ORAL | Status: DC | PRN

## 2019-11-01 MED ORDER — ACETAMINOPHEN 650 MG RE SUPP: 650.0000 mg | Freq: Four times a day (QID) | RECTAL | Status: DC | PRN

## 2019-11-01 MED ORDER — LIDOCAINE HCL (PF) 1 % IJ SOLN: 5.0000 mL | INTRAMUSCULAR | Status: DC | PRN

## 2019-11-01 MED ORDER — PENTAFLUOROPROP-TETRAFLUOROETH EX AERO: 1.0000 "application " | INHALATION_SPRAY | CUTANEOUS | Status: DC | PRN

## 2019-11-01 MED ORDER — SODIUM CHLORIDE 0.9 % IV SOLN: 250.0000 mL | INTRAVENOUS | Status: DC | PRN

## 2019-11-01 MED ORDER — WARFARIN SODIUM 2.5 MG PO TABS: 2.5000 mg | ORAL_TABLET | ORAL | Status: DC

## 2019-11-01 MED ORDER — HEPARIN SODIUM (PORCINE) 1000 UNIT/ML IJ SOLN
INTRAMUSCULAR | Status: AC
  Filled 2019-11-01: qty 4

## 2019-11-01 MED ORDER — SUCROFERRIC OXYHYDROXIDE 500 MG PO CHEW
500.0000 mg | CHEWABLE_TABLET | Freq: Three times a day (TID) | ORAL | Status: DC
  Administered 2019-11-01 – 2019-11-02 (×2): 500 mg via ORAL
  Filled 2019-11-01 (×5): qty 1

## 2019-11-01 MED ORDER — ALTEPLASE 2 MG IJ SOLR: 2.0000 mg | Freq: Once | INTRAMUSCULAR | Status: DC | PRN

## 2019-11-01 MED ORDER — BICTEGRAVIR-EMTRICITAB-TENOFOV 50-200-25 MG PO TABS
1.0000 | ORAL_TABLET | Freq: Every day | ORAL | Status: DC
  Administered 2019-11-01 – 2019-11-02 (×2): 1 via ORAL
  Filled 2019-11-01 (×2): qty 1

## 2019-11-01 MED ORDER — HEPARIN SODIUM (PORCINE) 1000 UNIT/ML DIALYSIS: 1000.0000 [IU] | INTRAMUSCULAR | Status: DC | PRN

## 2019-11-01 MED ORDER — SODIUM CHLORIDE 0.9% FLUSH
3.0000 mL | Freq: Two times a day (BID) | INTRAVENOUS | Status: DC
  Administered 2019-11-01: 3 mL via INTRAVENOUS

## 2019-11-01 MED ORDER — ACETAMINOPHEN 325 MG PO TABS: 650.0000 mg | ORAL_TABLET | Freq: Four times a day (QID) | ORAL | Status: DC | PRN

## 2019-11-01 MED ORDER — AMIODARONE HCL 200 MG PO TABS
200.0000 mg | ORAL_TABLET | Freq: Every day | ORAL | Status: DC
  Administered 2019-11-01 – 2019-11-02 (×2): 200 mg via ORAL
  Filled 2019-11-01 (×2): qty 1

## 2019-11-01 MED ORDER — SODIUM CHLORIDE 0.9% FLUSH: 3.0000 mL | INTRAVENOUS | Status: DC | PRN

## 2019-11-01 MED ORDER — LIDOCAINE-PRILOCAINE 2.5-2.5 % EX CREA: 1.0000 "application " | TOPICAL_CREAM | CUTANEOUS | Status: DC | PRN

## 2019-11-01 MED ORDER — NITROGLYCERIN IN D5W 200-5 MCG/ML-% IV SOLN
0.0000 ug/min | INTRAVENOUS | Status: DC
  Filled 2019-11-01: qty 250

## 2019-11-01 MED ORDER — FUROSEMIDE 10 MG/ML IJ SOLN
40.0000 mg | Freq: Once | INTRAMUSCULAR | Status: AC
  Administered 2019-11-01: 40 mg via INTRAVENOUS
  Filled 2019-11-01: qty 4

## 2019-11-01 MED ORDER — SODIUM CHLORIDE 0.9% FLUSH
3.0000 mL | Freq: Two times a day (BID) | INTRAVENOUS | Status: DC
  Administered 2019-11-01 – 2019-11-02 (×2): 3 mL via INTRAVENOUS

## 2019-11-01 MED ORDER — ONDANSETRON HCL 4 MG/2ML IJ SOLN: 4.0000 mg | Freq: Four times a day (QID) | INTRAMUSCULAR | Status: DC | PRN

## 2019-11-01 MED ORDER — ONDANSETRON HCL 4 MG PO TABS: 4.0000 mg | ORAL_TABLET | Freq: Four times a day (QID) | ORAL | Status: DC | PRN

## 2019-11-01 MED ORDER — WARFARIN SODIUM 5 MG PO TABS
5.0000 mg | ORAL_TABLET | ORAL | Status: DC
  Administered 2019-11-01: 5 mg via ORAL
  Filled 2019-11-01: qty 1

## 2019-11-01 MED ORDER — WARFARIN - PHARMACIST DOSING INPATIENT: Freq: Every day | Status: DC

## 2019-11-01 NOTE — Progress Notes (Signed)
Pt transferred to 4E-11 via stretcher from ED with RN. Pt walked to bed with home cane. Pt given CHG bath. Tele applied, CCMD notified. Pt oriented to call bell, bed and room. Call bell within reach. VSS. Will continue to monitor.  Amanda Cockayne, RN

## 2019-11-01 NOTE — Consult Note (Addendum)
[facility] Renal Consultation Note    Indication for Consultation:  Management of ESRD/hemodialysis, anemia, hypertension/volume, and secondary hyperparathyroidism.  HPI: David [name] is a 78 y.o. male with a history of ESRD on dialysis, a fib, CHF, HIV, HTN, who presented to the ED on 10/31/19 with shortness of breath. Patient reported acute onset SOB earlier that day. His last dialysis was 10/29/2019 due to the holiday schedule. On presentation to the ED, pt tachypneic but satting 100% on nonrebreather/ BP 150/80, EKF unchanged. CXR consistent with worsening CHF/volume overload. COVID negative. K+ 5.7, BUN 56, Cr 11.92, Ca 10.1, Hgb 11.8. Repeat K+ 5.9 this AM. Nephrology was consulted for management of ESRD.   Patient has a history of recurrent pulmonary edema and previously dialyzed 4 times per week due to volume. He had an ED visit 10/21/19 for SOB and was dialyzed in the hospital. EDW was decreased to 82kg at discharge.  Outpatient HD record were reviewed and it appears EDW was recently raised to 83kg due to significant cramping with dialysis. On exam, patient reports he is feeling ok. SOB improved. Denies CP, palpitation, dizziness, abdominal pain, palpitations, N/V/D. Reports he strictly limits how much he drinks at home. BP 810'F systolic.   Past Medical History:  Diagnosis Date  . 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 Acute on chronic systolic and diastolic heart failure, NYHA class 4 ([place])   . Anemia, iron deficiency 11/15/2011  . Arthritis    "hands, right knee, feet" (02/21/2018)  . Atrial fibrillation ([place])   . Cancer [facility])    hx of prostate; s/p radioactive seed implant 10/2009 Dr Janice Norrie  . Cardiac arrest ([place]) 02/17/2018  . CHF (congestive heart failure) ([place])   . Chronic combined systolic and diastolic heart failure, NYHA class 3 ([place]) 06/2010   felt to be secondary to hypertensive cardiomyopathy  . Chronic lower back pain   . CKD (chronic kidney disease)  stage V requiring chronic dialysis ([place])   . ESRD on dialysis [facility])    started 02/2018 [place]  . GERD (gastroesophageal reflux disease)   . Glaucoma    "I had surgery and dont have it any more"  . Gout    daily RX (02/21/2018)  . Heart murmur    "mild" per pt  . Hepatitis    years ago  . HIV (human immunodeficiency virus infection) ([place])   . HIV infection ([place])   . Hyperlipidemia   . Hypertension    followed by [facility] and Vascular (Dr Dani Gobble Croitoru)  . Hypertension   . Nonischemic cardiomyopathy ([place])   . Pneumonia 11/2017  . Prostate cancer ([place])   . Sinus bradycardia   . Sleep apnea    does not use a cpap  . Stroke [name])    "mini stroke" years ago   Past Surgical History:  Procedure Laterality Date  . AV FISTULA PLACEMENT Right 10/13/2016   Procedure: ARTERIOVENOUS (AV) FISTULA CREATION;  Surgeon: [name], MD;  Location: [place];  Service: Vascular;  Laterality: Right;  . AV FISTULA PLACEMENT Left 02/25/2018   Procedure: INSERTION OF ARTERIOVENOUS (AV) GORE-TEX GRAFT LEFT UPPER ARM;  Surgeon: Angelia Mould, MD;  Location: [place];  Service: Vascular;  Laterality: Left;  . AV FISTULA PLACEMENT Right 08/07/2018   Procedure: Creation of right arm brachiocephalic Fistula;  Surgeon: Waynetta Sandy, MD;  Location: [place];  Service: Vascular;  Laterality: Right;  . BASCILIC VEIN TRANSPOSITION Left 06/24/2015   Procedure:  BASILIC VEIN TRANSPOSITION;  Surgeon: Mal Misty, MD;  Location: [place];  Service: Vascular;  Laterality: Left;  . BIOPSY  03/14/2019   Procedure: BIOPSY;  Surgeon: Irene Shipper, MD;  Location: [place];  Service: Endoscopy;;  . CATARACT EXTRACTION, BILATERAL Bilateral   . COLONOSCOPY WITH PROPOFOL N/A 03/14/2019   Procedure: COLONOSCOPY WITH PROPOFOL;  Surgeon: Irene Shipper, MD;  Location: [place] ENDOSCOPY;  Service: Endoscopy;  Laterality: N/A;  . ESOPHAGOGASTRODUODENOSCOPY (EGD) WITH PROPOFOL N/A  05/05/2014   Procedure: ESOPHAGOGASTRODUODENOSCOPY (EGD) WITH PROPOFOL;  Surgeon: Missy Sabins, MD;  Location: [facility] END[facility]COPY;  Service: Endoscopy;  Laterality: N/A;  . EYE SURGERY Bilateral    cataract surgery   . GLAUCOMA SURGERY Bilateral   . INSERTION OF ARTERIOVENOUS (AV) ARTEGRAFT ARM  10/09/2018   Procedure: INSERTION OF ARTERIOVENOUS (AV) 2mm x 41cm ARTEGRAFT LEFT UPPER ARM;  Surgeon: Waynetta Sandy, MD;  Location: [place];  Service: Vascular;;  . INSERTION OF DIALYSIS CATHETER Right 07/14/2019   Procedure: Insertion Of Dialysis Catheter;  Surgeon: Elam Dutch, MD;  Location: [facility] OR;  Service: Vascular;  Laterality: Right;  placed right Internal Jugular  . INSERTION PROSTATE RADIATION SEED    . IR FLUORO GUIDE CV LINE RIGHT  02/18/2018  . IR REMOVAL TUN CV CATH W/O FL  12/06/2018  . IR US GUIDE VASC ACCESS RIGHT  02/18/2018  . LEFT HEART CATH AND CORONARY ANGIOGRAPHY N/A 02/20/2018   Procedure: LEFT HEART CATH AND CORONARY ANGIOGRAPHY;  Surgeon: Jettie Booze, MD;  Location: [place] CV LAB;;; 50% ostial OM2, 25% mLAD.  Marland Kitchen LEFT HEART CATH AND CORONARY ANGIOGRAPHY  2004   mildly depressed LV systolic fx EF 82%,[id number] coronaries/abdominal aorta/renal arteries.  Marland Kitchen NM MYOCAR PERF WALL MOTION  02/21/2010   No ischemia or infarction.  EF 27%.  Marland Kitchen RADIOACTIVE SEED IMPLANT  2010   prostate cancer  . REVISION OF ARTERIOVENOUS GORETEX GRAFT Right 07/11/2019   Procedure: REVISION OF ARTERIOVENOUS GORETEX GRAFT RIGHT ARM WITH ARTEGRAFT;  Surgeon: Serafina Mitchell, MD;  Location: [place];  Service: Vascular;  Laterality: Right;  . SHUNTOGRAM Right 07/14/2019   Procedure: Shuntogram;  Surgeon: Elam Dutch, MD;  Location: [place];  Service: Vascular;  Laterality: Right;  . THROMBECTOMY AND REVISION OF ARTERIOVENTOUS (AV) GORETEX  GRAFT Right 07/14/2019   Procedure: THROMBECTOMY AND REVISION OF ARTERIOVENTOUS (AV) GORETEX  GRAFT;  Surgeon: Elam Dutch, MD;  Location: [place];  Service:  Vascular;  Laterality: Right;  . THROMBECTOMY W/ EMBOLECTOMY Left 02/26/2018   Procedure: THROMBECTOMY ARTERIOVENOUS GRAFT;  Surgeon: Angelia Mould, MD;  Location: [place];  Service: Vascular;  Laterality: Left;  . TRANSTHORACIC ECHOCARDIOGRAM  02/2012   [facility]) EF 45-50% with mild global HK.  Mild LVH,LA mod. dilated,mild-mod. 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 AI.  Marland Kitchen TRANSTHORACIC ECHOCARDIOGRAM  9/'18; 3/'19   a) Severely reduced EF.  25 from 30%.  GR 1 DD with diffuse ecchymosis. Mild AS & AI.  Mild LA/RA dilation; b) (post PEA arest):   Moderately dilated LV with moderate concentric virtually.  EF 15 to 20%.  GR 1 DD.  Paradoxical septal motion. Mild AI.  Mild LA dilation.  Poorly visualized RV.  Marland Kitchen TRANSTHORACIC ECHOCARDIOGRAM  02/2019   EF 20-25%.  Unable to assess diastolic function (A. Fib).  No LV apical thrombus.  Mild AI.  Mildly reduced RV function, however poorly visualized.  Marland Kitchen ULTRASOUND GUIDANCE FOR VASCULAR ACCESS  02/20/2018  Procedure: Ultrasound Guidance For Vascular Access;  Surgeon: Jettie Booze, MD;  Location: [place] CV LAB;  Service: Cardiovascular;;  . UPPER EXTREMITY VENOGRAPHY N/A 07/08/2018   Procedure: UPPER EXTREMITY VENOGRAPHY;  Surgeon: Waynetta Sandy, MD;  Location: [place] CV LAB;  Service: Cardiovascular;  Laterality: N/A;  Bilateral   Family History  Problem Relation Age of Onset  . Hypertension Mother   . Thyroid disease Mother   . Cholelithiasis Daughter   . Cholelithiasis Son   . Hypertension Maternal Grandmother   . Diabetes Maternal Grandmother   . Heart attack Neg Hx   . Hyperlipidemia Neg Hx    Social History:  reports that he quit smoking about 14 years ago. His smoking use included cigarettes. He has a 30.00 pack-year smoking history. He has never used smokeless tobacco. He reports current alcohol use of about 1.0 standard drinks of alcohol per week. He reports that he does not use  drugs.  ROS: As per HPI otherwise negative.  Physical Exam: Vitals:   11/01/19 0700 11/01/19 0812 11/01/19 0842 11/01/19 1159  BP: (!) 146/86  (!) 160/85 (!) 144/85  Pulse: 79 78 84 77  Resp: 18 20 (!) 23 20  Temp:   98.4 F (36.9 C) (!) 97.4 F (36.3 C)  TempSrc:   Oral Oral  SpO2: 100% 94% 94% 95%  Weight:   86.5 kg   Height:   6\' 3"  (1.905 m)      General: Well developed, well nourished, in no acute distress. Head: Normocephalic, atraumatic, sclera non-icteric, mucus membranes are moist. Neck: Supple without lymphadenopathy/masses. JVD not elevated. Lungs: Rhonchi b/l bases. Respirations unlabored on O2 via nasal cannula Heart: RRR with normal S1, S2. No murmurs, rubs, or gallops appreciated. Abdomen: Soft, non-tender, non-distended with normoactive bowel sounds. No rebound/guarding. No obvious abdominal masses. Musculoskeletal:  Strength and tone appear normal for age. Lower extremities: No edema or ischemic changes, no open wounds. Neuro: Alert and oriented X 3. Moves all extremities spontaneously. Psych:  Responds to questions appropriately with a normal affect. Dialysis Access: LUE AVF, RN cannulating  Allergies  Allergen Reactions  . Dextromethorphan-Guaifenesin     Other reaction(s): Unknown  . Tocotrienols     Other reaction(s): Unknown  . Losartan Potassium Other (See Comments)    Causes constipation  Other reaction(s): Unknown   Prior to Admission medications   Medication Sig Start Date End Date Taking? Authorizing Provider  acetaminophen (TYLENOL) 500 MG tablet Take 1,000 mg by mouth every 6 (six) hours as needed for moderate pain.    [provider]  allopurinol (ZYLOPRIM) 100 MG tablet 1 tablet by mouth 3x weekly after dialysis. 09/15/19   [name], NP  amiodarone (PACERONE) 200 MG tablet Take 1 tablet (200 mg total) by mouth daily. 05/12/19   Croitoru, Mihai, MD  bictegravir-emtricitabine-tenofovir AF ([name]) 50-200-25 MG TABS tablet  Take 1 tablet by mouth daily. 09/29/19   Truman Hayward, MD  brimonidine (ALPHAGAN) 0.15 % ophthalmic solution Place 1 drop into the left eye 2 (two) times daily. 11/15/17   [provider]  dorzolamide-timolol (COSOPT) 22.3-6.8 MG/ML ophthalmic solution Place 1 drop into the left eye 2 (two) times daily. 12/17/17   [provider]  ferric citrate (AURYXIA) 1 GM 210 MG(Fe) tablet Take 420 mg by mouth 3 (three) times daily with meals.    [provider]  latanoprost (XALATAN) 0.005 % ophthalmic solution Place 1 drop into the left eye at bedtime.  [provider]  loratadine (CLARITIN) 10 MG tablet Take 10 mg by mouth daily.    [provider]  midodrine (PROAMATINE) 10 MG tablet Take 1 tablet (10 mg total) by mouth as directed. Twice a day on the day of HD Tuesday, Thursday, Saturday (1 in AM and 1 at [place]). Take 1/2 tablet (5mg ) a day on non HD days Patient taking differently: Take 10 mg by mouth See admin instructions. Take 1 tablet (10 mg) by mouth 3 times daily on Tuesdays, Thursdays, & Saturdays. 04/22/19   Leonie Man, MD  montelukast (SINGULAIR) 10 MG tablet Take 1 tablet (10 mg total) by mouth at bedtime. 09/12/19   [name], NP  multivitamin (RENA-VIT) TABS tablet Take 1 tablet by mouth daily at 2 PM.     [provider]  VELPHORO 500 MG chewable tablet  09/29/19   [provider]  warfarin (COUMADIN) 5 MG tablet Take 1/2 to 1 tablet by mouth daily as directed by coumadin clinic Patient taking differently: Take 2.5-5 mg by mouth See admin instructions. Take 1/2 tablet on Monday and Friday then take 1 tablet all the other days 07/25/19   Leonie Man, MD   Current Facility-Administered Medications  Medication Dose Route Frequency Provider Last Rate Last Dose  . 0.9 %  sodium chloride infusion  250 mL Intravenous PRN Opyd, Ilene Qua, MD      . acetaminophen (TYLENOL) tablet 650 mg  650 mg Oral Q6H PRN  Opyd, Ilene Qua, MD       Or  . acetaminophen (TYLENOL) suppository 650 mg  650 mg Rectal Q6H PRN Opyd, Ilene Qua, MD      . amiodarone (PACERONE) tablet 200 mg  200 mg Oral Daily Opyd, Ilene Qua, MD      . bictegravir-emtricitabine-tenofovir AF ([name]) 50-200-25 MG per tablet 1 tablet  1 tablet Oral Daily Opyd, Ilene Qua, MD      . Chlorhexidine Gluconate Cloth 2 % PADS 6 each  6 each Topical Q0600 Claudia Desanctis, MD      . HYDROcodone-acetaminophen (NORCO/VICODIN) 5-325 MG per tablet 1 tablet  1 tablet Oral Q6H PRN Opyd, Ilene Qua, MD      . nitroGLYCERIN 50 mg in dextrose 5 % 250 mL (0.2 mg/mL) infusion  0-200 mcg/min Intravenous Continuous Opyd, Ilene Qua, MD   Stopped at 11/01/19 0211  . ondansetron (ZOFRAN) tablet 4 mg  4 mg Oral Q6H PRN Opyd, Ilene Qua, MD       Or  . ondansetron (ZOFRAN) injection 4 mg  4 mg Intravenous Q6H PRN Opyd, Ilene Qua, MD      . sodium chloride flush (NS) 0.9 % injection 3 mL  3 mL Intravenous Q12H Opyd, Ilene Qua, MD      . sodium chloride flush (NS) 0.9 % injection 3 mL  3 mL Intravenous Q12H Opyd, Ilene Qua, MD      . sodium chloride flush (NS) 0.9 % injection 3 mL  3 mL Intravenous PRN Opyd, Ilene Qua, MD      . sucroferric oxyhydroxide (VELPHORO) chewable tablet 500 mg  500 mg Oral TID WC Opyd, Ilene Qua, MD      . Derrill Memo ON 11/03/2019] warfarin (COUMADIN) tablet 2.5 mg  2.5 mg Oral Once per day on Mon Fri Bryk, Veronda P, RPH      . warfarin (COUMADIN) tablet 5 mg  5 mg Oral Once per day on Sun Tue Wed Thu Sat Laren Everts, RPH      .  Warfarin - Pharmacist Dosing Inpatient   Does not apply q1800 Laren Everts [facility]       Labs: Basic Metabolic Panel: Recent Labs  Lab 11/01/19 0059 11/01/19 0128 11/01/19 0739  NA 143 141  --   K 5.7* 5.3* 5.9*  CL 103  --   --   CO2 22  --   --   GLUCOSE 125*  --   --   BUN 56*  --   --   CREATININE 11.92*  --   --   CALCIUM 10.1  --   --    CBC: Recent Labs  Lab 11/01/19 0059 11/01/19 0128  WBC  10.0  --   NEUTROABS 7.1  --   HGB 11.8* 12.6*  HCT 36.0* 37.0*  MCV 111.1*  --   PLT 192  --    Studies/Results: Dg Chest Port 1 View  Result Date: 11/01/2019 CLINICAL DATA:  Shortness of breath EXAM: PORTABLE CHEST 1 VIEW COMPARISON:  Radiograph 10/21/2019 FINDINGS: Dual lumen dialysis catheter tip terminates at the superior cavoatrial junction. There is diffuse hazy interstitial opacities throughout the lungs with cephalized, indistinct pulmonary vascularity as well as fissural and septal thickening, increased from comparison exam. Cardiomegaly is similar to prior. No pneumothorax or visible effusion the portion of the right costophrenic sulcus is collimated from view. Postsurgical changes are noted in the left axilla. No acute osseous or soft tissue abnormality. IMPRESSION: 1. Findings consistent with worsening CHF/volume overload. 2. Stable cardiomegaly. 3. Dual lumen dialysis catheter tip terminates at the superior cavoatrial junction. No pneumothorax or visible pleural effusion. Electronically Signed   By: Lovena Le M.D.   On: 11/01/2019 01:17    Dialysis Orders: Center: [facility]  on TTS. 180NRe, Time: 4 hours, BFR 400, BFR Auto 1.5, EDW 83kg, 3K/ 2Ca, UF Profile 4 No heparin Calcitriol 0.5 mcg PO q HD Midodrine 10mg  PO, 1 tab before HD and 1 tab after  Assessment/Plan: 1.  Pulmonary edema: Presented with SOB, CXR consistent with worsening CHF/ volume overload. HD off schedule this week due to holiday so last HD was 11/25. EDW also recently increased due to cramping. Patient is very sensitive to excess fluid and previously dialyzed 4 times per week. 3.5kg above new EDW. If patient is unable to tolerate UF, may need to consider going back to 4 times per week.  2.  ESRD:  TTS- planned for HD today as above. K+ 5.9- expect resolution with HD.  3.  Hypertension: BP elevated with excess volume, tends to drop with HD. Will order midodrine prn for HD today then resume  midodrine 10mg  pre-HD for next treatment.  4.  Anemia: Hgb 12.6. No ESA indicated.  5.  Metabolic bone disease: Calcium 10.1. Hold calcitriol. Not on a binder.  6.  Nutrition:  Renal diet with fluid restrictions.   Anice Paganini, PA-C 11/01/2019, 1:10 PM  [facility] Pager: [phone]  Pt seen, examined and agree w A/P as above.  Kelly Splinter  MD 11/01/2019, 4:24 PM

## 2019-11-01 NOTE — ED Notes (Signed)
Pt finished eating a small amount of breakfast, states he is worried that he will have emesis.  Denies any nausea at this time.  Updated on POC, states he feels improved, no SOB and would like ot go home after dialysis.

## 2019-11-01 NOTE — ED Notes (Signed)
Light green top redrawn and sent to lab.

## 2019-11-01 NOTE — ED Notes (Signed)
ED TO INPATIENT HANDOFF REPORT  ED Nurse Name and Phone #: [phone]  S Name/Age/Gender [facility] 78 y.o. male Room/Bed: RESUSC/RESUSC  Code Status   Code Status: Prior  Home/SNF/Other Home Patient oriented to: self, place, time and situation Is this baseline? Yes   Triage Complete: Triage complete  Chief Complaint chf  Triage Note The pt arrived by gems from home dialysis pt that is c/io sob  He arrived on c-pap  Dialysis cath rt upper chest  Rt arm restricted   Allergies Allergies  Allergen Reactions  . Dextromethorphan-Guaifenesin     Other reaction(s): Unknown  . Tocotrienols     Other reaction(s): Unknown  . Losartan Potassium Other (See Comments)    Causes constipation  Other reaction(s): Unknown    Level of Care/Admitting Diagnosis ED Disposition    ED Disposition Condition [facility]: [facility] [100100]  Level of Care: Progressive [102]  Admit to Progressive based on following criteria: RESPIRATORY PROBLEMS hypoxemic/hypercapnic respiratory failure that is responsive to NIPPV (BiPAP) or High Flow Nasal Cannula (6-80 lpm). Frequent assessment/intervention, no > Q2 hrs < Q4 hrs, to maintain oxygenation and pulmonary hygiene.  Covid Evaluation: Asymptomatic Screening Protocol (No Symptoms)  Diagnosis: Acute respiratory failure with hypoxia [facility]) [518841]  Admitting Physician: Vianne Bulls [[id number]]  Attending Physician: Vianne Bulls [[id number]]  PT Class (Do Not Modify): Observation [104]  PT Acc Code (Do Not Modify): Observation [10022]       B Medical/Surgery History Past Medical History:  Diagnosis Date  . 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 Acute on chronic systolic and diastolic heart failure, NYHA class 4 ([place])   . Anemia, iron deficiency 11/15/2011  . Arthritis    "hands, right knee, feet" (02/21/2018)  . Atrial fibrillation ([place])   . Cancer [facility])    hx of prostate; s/p radioactive seed  implant 10/2009 Dr Janice Norrie  . Cardiac arrest ([place]) 02/17/2018  . CHF (congestive heart failure) ([place])   . Chronic combined systolic and diastolic heart failure, NYHA class 3 ([place]) 06/2010   felt to be secondary to hypertensive cardiomyopathy  . Chronic lower back pain   . CKD (chronic kidney disease) stage V requiring chronic dialysis ([place])   . ESRD on dialysis [facility])    started 02/2018 [place]  . GERD (gastroesophageal reflux disease)   . Glaucoma    "I had surgery and dont have it any more"  . Gout    daily RX (02/21/2018)  . Heart murmur    "mild" per pt  . Hepatitis    years ago  . HIV (human immunodeficiency virus infection) ([place])   . HIV infection ([place])   . Hyperlipidemia   . Hypertension    followed by [facility] (Dr Dani Gobble Croitoru)  . Hypertension   . Nonischemic cardiomyopathy ([place])   . Pneumonia 11/2017  . Prostate cancer ([place])   . Sinus bradycardia   . Sleep apnea    does not use a cpap  . Stroke [facility])    "mini stroke" years ago   Past Surgical History:  Procedure Laterality Date  . AV FISTULA PLACEMENT Right 10/13/2016   Procedure: ARTERIOVENOUS (AV) FISTULA CREATION;  Surgeon: [name], MD;  Location: [place];  Service: Vascular;  Laterality: Right;  . AV FISTULA PLACEMENT Left 02/25/2018   Procedure: INSERTION OF ARTERIOVENOUS (AV) GORE-TEX GRAFT LEFT UPPER ARM;  Surgeon: Angelia Mould, MD;  Location: [facility]  OR;  Service: Vascular;  Laterality: Left;  . AV FISTULA PLACEMENT Right 08/07/2018   Procedure: Creation of right arm brachiocephalic Fistula;  Surgeon: Waynetta Sandy, MD;  Location: [place];  Service: Vascular;  Laterality: Right;  . BASCILIC VEIN TRANSPOSITION Left 06/24/2015   Procedure: BASILIC VEIN TRANSPOSITION;  Surgeon: Mal Misty, MD;  Location: [place];  Service: Vascular;  Laterality: Left;  . BIOPSY  03/14/2019   Procedure: BIOPSY;  Surgeon: Irene Shipper, MD;  Location: [place];  Service: Endoscopy;;  . CATARACT EXTRACTION, BILATERAL Bilateral   . COLONOSCOPY WITH PROPOFOL N/A 03/14/2019   Procedure: COLONOSCOPY WITH PROPOFOL;  Surgeon: Irene Shipper, MD;  Location: [facility] ENDOSCOPY;  Service: Endoscopy;  Laterality: N/A;  . ESOPHAGOGASTRODUODENOSCOPY (EGD) WITH PROPOFOL N/A 05/05/2014   Procedure: ESOPHAGOGASTRODUODENOSCOPY (EGD) WITH PROPOFOL;  Surgeon: Missy Sabins, MD;  Location: [facility] ENDOSCOPY;  Service: Endoscopy;  Laterality: N/A;  . EYE SURGERY Bilateral    cataract surgery   . GLAUCOMA SURGERY Bilateral   . INSERTION OF ARTERIOVENOUS (AV) ARTEGRAFT ARM  10/09/2018   Procedure: INSERTION OF ARTERIOVENOUS (AV) 55m x 41cm ARTEGRAFT LEFT UPPER ARM;  Surgeon: CWaynetta Sandy MD;  [place]  Service: Vascular;;  . INSERTION OF DIALYSIS CATHETER Right 07/14/2019   Procedure: Insertion Of Dialysis Catheter;  Surgeon: FElam Dutch MD;  [facility];  Service: Vascular;  Laterality: Right;  placed right Internal Jugular  . INSERTION PROSTATE RADIATION SEED    . IR FLUORO GUIDE CV LINE RIGHT  02/18/2018  . IR REMOVAL TUN CV CATH W/O FL  12/06/2018  . IR [place]ASC ACCESS RIGHT  02/18/2018  . LEFT HEART CATH AND CORONARY ANGIOGRAPHY N/A 02/20/2018   Procedure: LEFT HEART CATH AND CORONARY ANGIOGRAPHY;  Surgeon: VJettie Booze MD;  [place];;; 50% ostial OM2, 25% mLAD.  .Marland KitchenLEFT HEART CATH AND CORONARY ANGIOGRAPHY  2004   mildly depressed LV systolic fx EF 502%,DXAJOIcoronaries/abdominal aorta/renal arteries.  .Marland KitchenNM MYOCAR PERF WALL MOTION  02/21/2010   No ischemia or infarction.  EF 27%.  .Marland KitchenRADIOACTIVE SEED IMPLANT  2010   prostate cancer  . REVISION OF ARTERIOVENOUS GORETEX GRAFT Right 07/11/2019   Procedure: REVISION OF ARTERIOVENOUS GORETEX GRAFT RIGHT ARM WITH ARTEGRAFT;  Surgeon: BSerafina Mitchell MD;  [place]  Service: Vascular;  Laterality: Right;  . SHUNTOGRAM Right 07/14/2019   Procedure: Shuntogram;   Surgeon: FElam Dutch MD;  [place]  Service: Vascular;  Laterality: Right;  . THROMBECTOMY AND REVISION OF ARTERIOVENTOUS (AV) GORETEX  GRAFT Right 07/14/2019   Procedure: THROMBECTOMY AND REVISION OF ARTERIOVENTOUS (AV) GORETEX  GRAFT;  Surgeon: FElam Dutch MD;  [place]  Service: Vascular;  Laterality: Right;  . THROMBECTOMY W/ EMBOLECTOMY Left 02/26/2018   Procedure: THROMBECTOMY ARTERIOVENOUS GRAFT;  Surgeon: DAngelia Mould MD;  [place]  Service: Vascular;  Laterality: Left;  . TRANSTHORACIC ECHOCARDIOGRAM  02/2012   ([facility] EF 45-50% with mild global HK.  Mild LVH,LA mod. dilated,mild-mod. 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 AI.  .Marland KitchenTRANSTHORACIC ECHOCARDIOGRAM  9/'18; 3/'19   a) Severely reduced EF.  25 from 30%.  GR 1 DD with diffuse ecchymosis. Mild AS & AI.  Mild LA/RA dilation; b) (post PEA arest):   Moderately dilated LV with moderate concentric virtually.  EF 15 to 20%.  GR 1 DD.  Paradoxical septal motion. Mild AI.  Mild  LA dilation.  Poorly visualized RV.  Marland Kitchen TRANSTHORACIC ECHOCARDIOGRAM  02/2019   EF 20-25%.  Unable to assess diastolic function (A. Fib).  No LV apical thrombus.  Mild AI.  Mildly reduced RV function, however poorly visualized.  Marland Kitchen ULTRASOUND GUIDANCE FOR VASCULAR ACCESS  02/20/2018   Procedure: Ultrasound Guidance For Vascular Access;  Surgeon: Jettie Booze, MD;  Location: [place] CV LAB;  Service: Cardiovascular;;  . UPPER EXTREMITY VENOGRAPHY N/A 07/08/2018   Procedure: UPPER EXTREMITY VENOGRAPHY;  Surgeon: Waynetta Sandy, MD;  Location: [place] CV LAB;  Service: Cardiovascular;  Laterality: N/A;  Bilateral     A IV Location/Drains/Wounds Patient Lines/Drains/Airways Status   Active Line/Drains/Airways    Name:   Placement date:   Placement time:   Site:   Days:   Peripheral IV 11/01/19 Left Hand   11/01/19    0122    Hand   less than 1   Hemodialysis Catheter Right  Internal jugular Double lumen Permanent (Tunneled)   07/14/19    1803    Internal jugular   110   Incision (Closed) 07/14/19 Neck Right;Anterior   07/14/19    1827     110   Incision (Closed) 07/14/19 Arm Right   07/14/19    1827     110          Intake/Output Last 24 hours No intake or output data in the 24 hours ending 11/01/19 0558  Labs/Imaging Results for orders placed or performed during the hospital encounter of 11/01/19 (from the past 48 hour(s))  Basic metabolic panel     Status: Abnormal   Collection Time: 11/01/19 12:59 AM  Result Value Ref Range   Sodium 143 135 - 145 mmol/L   Potassium 5.7 (H) 3.5 - 5.1 mmol/L   Chloride 103 98 - 111 mmol/L   CO2 22 22 - 32 mmol/L   Glucose, Bld 125 (H) 70 - 99 mg/dL   BUN 56 (H) 8 - 23 mg/dL   Creatinine, Ser 11.92 (H) 0.61 - 1.24 mg/dL   Calcium 10.1 8.9 - 10.3 mg/dL   GFR calc non Af Amer 4 (L) >60 mL/min   GFR calc Af Amer 4 (L) >60 mL/min   Anion gap 18 (H) 5 - 15    Comment: Performed at [facility], [address] [place], [place] [address]  CBC with Differential     Status: Abnormal   Collection Time: 11/01/19 12:59 AM  Result Value Ref Range   WBC 10.0 4.0 - 10.5 K/uL   RBC 3.24 (L) 4.22 - 5.81 MIL/uL   Hemoglobin 11.8 (L) 13.0 - 17.0 g/dL   HCT 36.0 (L) 39.0 - 52.0 %   MCV 111.1 (H) 80.0 - 100.0 fL   MCH 36.4 (H) 26.0 - 34.0 pg   MCHC 32.8 30.0 - 36.0 g/dL   RDW 14.3 11.5 - 15.5 %   Platelets 192 150 - 400 K/uL   nRBC 0.0 0.0 - 0.2 %   Neutrophils Relative % 71 %   Neutro Abs 7.1 1.7 - 7.7 K/uL   Lymphocytes Relative 16 %   Lymphs Abs 1.6 0.7 - 4.0 K/uL   Monocytes Relative 6 %   Monocytes Absolute 0.6 0.1 - 1.0 K/uL   Eosinophils Relative 6 %   Eosinophils Absolute 0.6 (H) 0.0 - 0.5 K/uL   Basophils Relative 1 %   Basophils Absolute 0.1 0.0 - 0.1 K/uL   Immature Granulocytes 0 %  Abs Immature Granulocytes 0.03 0.00 - 0.07 K/uL   Polychromasia PRESENT     Comment: Performed at [facility], [place] [address], [place], [place] [address]  Brain natriuretic peptide     Status: Abnormal   Collection Time: 11/01/19 12:59 AM  Result Value Ref Range   B Natriuretic Peptide 795.5 (H) 0.0 - 100.0 pg/mL    Comment: Performed at [facility] [address], [place], [place] [address]  POC SARS Coronavirus 2 Ag-ED - Nasal Swab (BD Veritor Kit)     Status: None   Collection Time: 11/01/19  1:31 AM  Result Value Ref Range   SARS Coronavirus 2 Ag NEGATIVE NEGATIVE    Comment: (NOTE) SARS-CoV-2 antigen NOT DETECTED.  Negative results are presumptive.  Negative results do not preclude SARS-CoV-2 infection and should not be used as the sole basis for treatment or other patient management decisions, including infection  control decisions, particularly in the presence of clinical signs and  symptoms consistent with COVID-19, or in those who have been in contact with the virus.  Negative results must be combined with clinical observations, patient history, and epidemiological information. The expected result is Negative. Fact Sheet for Patients: PodPark.tn Fact Sheet for Healthcare Providers: GiftContent.is This test is not yet approved or cleared by the [place] FDA and  has been authorized for detection and/or diagnosis of SARS-CoV-2 by FDA under an Emergency Use Authorization (EUA).  This EUA will remain in effect (meaning this test can be used) for the duration of  the COVID-19 de claration under Section 564(b)(1) of the Act, 21 U.S.C. section 360bbb-3(b)(1), unless the authorization is terminated or revoked sooner.    Dg Chest Port 1 View  Result Date: 11/01/2019 CLINICAL DATA:  Shortness of breath EXAM: PORTABLE CHEST 1 VIEW COMPARISON:  Radiograph 10/21/2019 FINDINGS: Dual lumen dialysis catheter tip terminates at the superior cavoatrial junction. There is diffuse hazy interstitial opacities throughout the lungs  with cephalized, indistinct pulmonary vascularity as well as fissural and septal thickening, increased from comparison exam. Cardiomegaly is similar to prior. No pneumothorax or visible effusion the portion of the right costophrenic sulcus is collimated from view. Postsurgical changes are noted in the left axilla. No acute osseous or soft tissue abnormality. IMPRESSION: 1. Findings consistent with worsening CHF/volume overload. 2. Stable cardiomegaly. 3. Dual lumen dialysis catheter tip terminates at the superior cavoatrial junction. No pneumothorax or visible pleural effusion. Electronically Signed   By: Lovena Le M.D.   On: 11/01/2019 01:17    Pending Labs Unresulted Labs (From admission, onward)    Start     Ordered   11/01/19 1000  Potassium  Once-Timed,   STAT    Comments: Please cancel this order if patient has already been dialyzed today or is being dialyzed.    11/01/19 0252   11/01/19 1761  Basic metabolic panel  Tomorrow morning,   R     11/01/19 0251   11/01/19 0500  Protime-INR  Daily,   STAT     11/01/19 0259   11/01/19 0059  SARS CORONAVIRUS 2 (TAT 6-24 HRS) Nasopharyngeal Nasopharyngeal Swab  (Asymptomatic/Tier 3)  Once,   STAT    Question Answer Comment  Is this test for diagnosis or screening Screening   Symptomatic for COVID-19 as defined by CDC No   Hospitalized for COVID-19 No   Admitted to ICU for COVID-19 No   Previously tested for COVID-19 Yes   Resident in a congregate (group) care  setting No   Employed in healthcare setting No      11/01/19 0058          Vitals/Pain Today's Vitals   11/01/19 0130 11/01/19 0145 11/01/19 0200 11/01/19 0215  BP: (!) 147/90 (!) 152/88 (!) 150/83 (!) 148/79  Pulse: 98 98 94   Resp: 20 (!) 23 (!) 22 18  Temp:      SpO2: 100% 100% 100%   Weight:      PainSc:        Isolation Precautions Airborne and Contact precautions  Medications Medications  nitroGLYCERIN 50 mg in dextrose 5 % 250 mL (0.2 mg/mL) infusion (0  mcg/min Intravenous Hold 11/01/19 0211)  Warfarin - Pharmacist Dosing Inpatient (has no administration in time range)  warfarin (COUMADIN) tablet 2.5 mg (has no administration in time range)  warfarin (COUMADIN) tablet 5 mg (has no administration in time range)  Chlorhexidine Gluconate Cloth 2 % PADS 6 each (has no administration in time range)  furosemide (LASIX) injection 40 mg (40 mg Intravenous Given 11/01/19 0212)    Mobility walks Low fall risk   Focused Assessments    R Recommendations: See Admitting Provider Note  Report given to:   Additional Notes:

## 2019-11-01 NOTE — ED Notes (Signed)
c-pap removed from the pt by respiratory  Pt had iv placed by gems  And was given numerus drugs .  Pt started getting sob at 2330.  Alert  At present no distress

## 2019-11-01 NOTE — Progress Notes (Signed)
Progress Note    [facility]  KDT:[id number] DOB: 08-22-1941  DOA: 11/01/2019 PCP: [name], NP      Brief Narrative:    Medical records reviewed and are as summarized below:  [facility] is an 78 y.o. male with medical history significant for ESRD on hemodialysis, paroxysmal atrial fibrillation on warfarin, chronic combined systolic and diastolic CHF, prostate cancer status post brachytherapy, and HIV.  He presented to the hospital with sudden onset of shortness of breath.  He last had dialysis on 10/29/2019 without any problems.  He said his dialysis schedule was adjusted because of the Thanksgiving holidays.      Assessment/Plan:   Principal Problem:   Acute respiratory failure with hypoxia (HCC) Active Problems:   Nonischemic dilated cardiomyopathy (HCC)   ESRD on hemodialysis (HCC)   HIV (human immunodeficiency virus infection) ([place])   ESRD (end stage renal disease) (HCC)   Volume overload   Paroxysmal atrial fibrillation (HCC)   Hyperkalemia   Acute respiratory failure with hypoxemia (HCC)   Body mass index is 23.84 kg/m.   End-stage renal disease with hyperkalemia pulmonary edema/fluid overload: Plan for hemodialysis today.  Hyperkalemia expected to improve with hemodialysis.  Follow-up with nephrologist.  Acute hypoxemic respiratory failure: Resolved.  Acute exacerbation of systolic CHF: 2D echo done in March 2020 showed EF estimated at 20 to 25%.  HIV infection: Continue [name]  Hypertension: BP expected to improve with dialysis.  Paroxysmal atrial fibrillation: Continue amiodarone and warfarin.  Pharmacist to dose warfarin based on INR.   Family Communication/Anticipated D/C date and plan/Code Status   DVT prophylaxis: Warfarin Code Status: Full code Family Communication: Plan discussed with the patient Disposition Plan: Possible discharge home in 2 days      Subjective:   C/o shortness of breath.  No chest  pain, cough or wheezing.  Objective:    Vitals:   11/01/19 0700 11/01/19 0812 11/01/19 0842 11/01/19 1159  BP: (!) 146/86  (!) 160/85 (!) 144/85  Pulse: 79 78 84 77  Resp: 18 20 (!) 23 20  Temp:   98.4 F (36.9 C) (!) 97.4 F (36.3 C)  TempSrc:   Oral Oral  SpO2: 100% 94% 94% 95%  Weight:   86.5 kg   Height:   6\' 3"  (1.905 m)     Intake/Output Summary (Last 24 hours) at 11/01/2019 1512 Last data filed at 11/01/2019 1048 Gross per 24 hour  Intake 120 ml  Output 100 ml  Net 20 ml   Filed Weights   11/01/19 0116 11/01/19 0842  Weight: 83.9 kg 86.5 kg    Exam:  GEN: NAD SKIN: No rash EYES: EOMI ENT: MMM CV: RRR PULM: CTA B ABD: soft, ND, NT, +BS CNS: AAO x 3, non focal EXT: No edema or tenderness   Data Reviewed:   I have personally reviewed following labs and imaging studies:  Labs: Labs show the following:   Basic Metabolic Panel: Recent Labs  Lab 11/01/19 0059 11/01/19 0128 11/01/19 0739  NA 143 141  --   K 5.7* 5.3* 5.9*  CL 103  --   --   CO2 22  --   --   GLUCOSE 125*  --   --   BUN 56*  --   --   CREATININE 11.92*  --   --   CALCIUM 10.1  --   --    GFR Estimated Creatinine Clearance: 6.2 mL/min (A) (by C-G formula based on SCr  of 11.92 mg/dL (H)). Liver Function Tests: No results for input(s): AST, ALT, ALKPHOS, BILITOT, PROT, ALBUMIN in the last 168 hours. No results for input(s): LIPASE, AMYLASE in the last 168 hours. No results for input(s): AMMONIA in the last 168 hours. Coagulation profile Recent Labs  Lab 10/27/19 11/01/19 0603  INR 3.3* 1.8*    CBC: Recent Labs  Lab 11/01/19 0059 11/01/19 0128  WBC 10.0  --   NEUTROABS 7.1  --   HGB 11.8* 12.6*  HCT 36.0* 37.0*  MCV 111.1*  --   PLT 192  --    Cardiac Enzymes: No results for input(s): CKTOTAL, CKMB, CKMBINDEX, TROPONINI in the last 168 hours. BNP (last 3 results) No results for input(s): PROBNP in the last 8760 hours. CBG: No results for input(s): GLUCAP in the  last 168 hours. D-Dimer: No results for input(s): DDIMER in the last 72 hours. Hgb A1c: No results for input(s): HGBA1C in the last 72 hours. Lipid Profile: No results for input(s): CHOL, HDL, LDLCALC, TRIG, CHOLHDL, LDLDIRECT in the last 72 hours. Thyroid function studies: No results for input(s): TSH, T4TOTAL, T3FREE, THYROIDAB in the last 72 hours.  Invalid input(s): FREET3 Anemia work up: No results for input(s): VITAMINB12, FOLATE, FERRITIN, TIBC, IRON, RETICCTPCT in the last 72 hours. Sepsis Labs: Recent Labs  Lab 11/01/19 0059  WBC 10.0    Microbiology Recent Results (from the past 240 hour(s))  SARS CORONAVIRUS 2 (TAT 6-24 HRS) Nasopharyngeal Nasopharyngeal Swab     Status: None   Collection Time: 11/01/19  2:25 AM   Specimen: Nasopharyngeal Swab  Result Value Ref Range Status   SARS Coronavirus 2 NEGATIVE NEGATIVE Final    Comment: (NOTE) SARS-CoV-2 target nucleic acids are NOT DETECTED. The SARS-CoV-2 RNA is generally detectable in upper and lower respiratory specimens during the acute phase of infection. Negative results do not preclude SARS-CoV-2 infection, do not rule out co-infections with other pathogens, and should not be used as the sole basis for treatment or other patient management decisions. Negative results must be combined with clinical observations, patient history, and epidemiological information. The expected result is Negative. Fact Sheet for Patients: SugarRoll.be Fact Sheet for Healthcare Providers: https://www.woods-mathews.com/ This test is not yet approved or cleared by the [place] FDA and  has been authorized for detection and/or diagnosis of SARS-CoV-2 by FDA under an Emergency Use Authorization (EUA). This EUA will remain  in effect (meaning this test can be used) for the duration of the COVID-19 declaration under Section 56 4(b)(1) of the Act, 21 U.S.C. section 360bbb-3(b)(1), unless the  authorization is terminated or revoked sooner. Performed at [facility], [place] [address], [place],  [address]     Procedures and diagnostic studies:  Dg Chest Port 1 View  Result Date: 11/01/2019 CLINICAL DATA:  Shortness of breath EXAM: PORTABLE CHEST 1 VIEW COMPARISON:  Radiograph 10/21/2019 FINDINGS: Dual lumen dialysis catheter tip terminates at the superior cavoatrial junction. There is diffuse hazy interstitial opacities throughout the lungs with cephalized, indistinct pulmonary vascularity as well as fissural and septal thickening, increased from comparison exam. Cardiomegaly is similar to prior. No pneumothorax or visible effusion the portion of the right costophrenic sulcus is collimated from view. Postsurgical changes are noted in the left axilla. No acute osseous or soft tissue abnormality. IMPRESSION: 1. Findings consistent with worsening CHF/volume overload. 2. Stable cardiomegaly. 3. Dual lumen dialysis catheter tip terminates at the superior cavoatrial junction. No pneumothorax or visible pleural effusion. Electronically Signed  By: Lovena Le M.D.   On: 11/01/2019 01:17    Medications:   . amiodarone  200 mg Oral Daily  . bictegravir-emtricitabine-tenofovir AF  1 tablet Oral Daily  . Chlorhexidine Gluconate Cloth  6 each Topical Q0600  . [START ON 11/04/2019] midodrine  10 mg Oral Q T,Th,Sa-HD  . sodium chloride flush  3 mL Intravenous Q12H  . sodium chloride flush  3 mL Intravenous Q12H  . sucroferric oxyhydroxide  500 mg Oral TID WC  . [START ON 11/03/2019] warfarin  2.5 mg Oral Once per day on Mon Fri  . warfarin  5 mg Oral Once per day on Sun Tue Wed Thu Sat  . Warfarin - Pharmacist Dosing Inpatient   Does not apply q1800   Continuous Infusions: . sodium chloride    . nitroGLYCERIN Stopped (11/01/19 0211)     LOS: 0 days   Sanjuan Sawa  [facility]   *Please refer to [email or web address], password [email or web address] to get updated schedule on who will round on  this patient, as hospitalists switch teams weekly. If 7PM-7AM, please contact night-coverage at [email or web address], password [email or web address] for any overnight needs.  11/01/2019, 3:12 PM

## 2019-11-01 NOTE — ED Notes (Signed)
Breakfast ordered 

## 2019-11-01 NOTE — H&P (Signed)
History and Physical    [facility] UJW:[id number] DOB: [birth year] DOA: 11/01/2019  PCP: [name], NP   Patient coming from: Home   Chief Complaint: SOB   HPI: David [name] is a 78 y.o. male with medical history significant for ESRD on hemodialysis, paroxysmal atrial fibrillation on warfarin, chronic combined systolic and diastolic CHF, prostate cancer status post brachytherapy, and HIV, now presenting to the emergency department for evaluation of shortness of breath.  Patient reports cute onset of shortness of breath shortly before midnight.  Patient reports that his dialysis schedule was adjusted for the holidays, he reports completing dialysis on 10/29/2019 without incident, denies any dietary indiscretions, and reports acute onset of shortness of breath beginning tonight.  He denies any fevers, chills, cough, or chest pain.  He denies leg swelling.  He reports orthopnea.  ED Course: Upon arrival to the ED, patient is found to be afebrile, saturating 100% on nonrebreather, tachypneic in the mid 20s, and with blood pressure 150/80.  EKG features a sinus rhythm with chronic LBBB.  Chest x-ray is concerning for worsening CHF/volume overload.  Chemistry panel is notable for potassium of 5.7, normal bicarbonate, BUN 56, and creatinine 11.92.  CBC features a mild microcytic anemia with hemoglobin 11.8.  BNP is elevated to 796.  Covid antigen test is negative and Covid PCR is pending.  INR is pending.  Patient was treated with 40 mg IV Lasix and nitroglycerin infusion in the emergency department.  Nephrology was consulted by the ED physician.  Review of Systems:  All other systems reviewed and apart from HPI, are negative.  Past Medical History:  Diagnosis Date  . 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 Acute on chronic systolic and diastolic heart failure, NYHA class 4 ([place])   . Anemia, iron deficiency 11/15/2011  . Arthritis    "hands, right knee, feet" (02/21/2018)  .  Atrial fibrillation ([place])   . Cancer [facility])    hx of prostate; s/p radioactive seed implant 10/2009 Dr Janice Norrie  . Cardiac arrest ([place]) 02/17/2018  . CHF (congestive heart failure) ([place])   . Chronic combined systolic and diastolic heart failure, NYHA class 3 ([place]) 06/2010   felt to be secondary to hypertensive cardiomyopathy  . Chronic lower back pain   . CKD (chronic kidney disease) stage V requiring chronic dialysis ([place])   . ESRD on dialysis [facility])    started 02/2018 [place]  . GERD (gastroesophageal reflux disease)   . Glaucoma    "I had surgery and dont have it any more"  . Gout    daily RX (02/21/2018)  . Heart murmur    "mild" per pt  . Hepatitis    years ago  . HIV (human immunodeficiency virus infection) ([place])   . HIV infection ([place])   . Hyperlipidemia   . Hypertension    followed by [facility] (Dr Dani Gobble Croitoru)  . Hypertension   . Nonischemic cardiomyopathy ([place])   . Pneumonia 11/2017  . Prostate cancer ([place])   . Sinus bradycardia   . Sleep apnea    does not use a cpap  . Stroke [facility])    "mini stroke" years ago    Past Surgical History:  Procedure Laterality Date  . AV FISTULA PLACEMENT Right 10/13/2016   Procedure: ARTERIOVENOUS (AV) FISTULA CREATION;  Surgeon: [name], MD;  Location: [facility] OR;  Service: Vascular;  Laterality: Right;  . AV FISTULA PLACEMENT Left 02/25/2018   Procedure: INSERTION OF  ARTERIOVENOUS (AV) GORE-TEX GRAFT LEFT UPPER ARM;  Surgeon: Angelia Mould, MD;  Location: [facility] OR;  Service: Vascular;  Laterality: Left;  . AV FISTULA PLACEMENT Right 08/07/2018   Procedure: Creation of right arm brachiocephalic Fistula;  Surgeon: Waynetta Sandy, MD;  Location: [place];  Service: Vascular;  Laterality: Right;  . BASCILIC VEIN TRANSPOSITION Left 06/24/2015   Procedure: BASILIC VEIN TRANSPOSITION;  Surgeon: Mal Misty, MD;  Location: [place];  Service: Vascular;  Laterality: Left;  .  BIOPSY  03/14/2019   Procedure: BIOPSY;  Surgeon: Irene Shipper, MD;  Location: [place];  Service: Endoscopy;;  . CATARACT EXTRACTION, BILATERAL Bilateral   . COLONOSCOPY WITH PROPOFOL N/A 03/14/2019   Procedure: COLONOSCOPY WITH PROPOFOL;  Surgeon: Irene Shipper, MD;  Location: [facility] ENDOSCOPY;  Service: Endoscopy;  Laterality: N/A;  . ESOPHAGOGASTRODUODENOSCOPY (EGD) WITH PROPOFOL N/A 05/05/2014   Procedure: ESOPHAGOGASTRODUODENOSCOPY (EGD) WITH PROPOFOL;  Surgeon: Missy Sabins, MD;  Location: [facility] ENDOSCOPY;  Service: Endoscopy;  Laterality: N/A;  . EYE SURGERY Bilateral    cataract surgery   . GLAUCOMA SURGERY Bilateral   . INSERTION OF ARTERIOVENOUS (AV) ARTEGRAFT ARM  10/09/2018   Procedure: INSERTION OF ARTERIOVENOUS (AV) 97mm x 41cm ARTEGRAFT LEFT UPPER ARM;  Surgeon: Waynetta Sandy, MD;  Location: [place];  Service: Vascular;;  . INSERTION OF DIALYSIS CATHETER Right 07/14/2019   Procedure: Insertion Of Dialysis Catheter;  Surgeon: Elam Dutch, MD;  Location: [name] OR;  Service: Vascular;  Laterality: Right;  placed right Internal Jugular  . INSERTION PROSTATE RADIATION SEED    . IR FLUORO GUIDE CV LINE RIGHT  02/18/2018  . IR REMOVAL TUN CV CATH W/O FL  12/06/2018  . IR US GUIDE VASC ACCESS RIGHT  02/18/2018  . LEFT HEART CATH AND CORONARY ANGIOGRAPHY N/A 02/20/2018   Procedure: LEFT HEART CATH AND CORONARY ANGIOGRAPHY;  Surgeon: Jettie Booze, MD;  Location: [place] CV LAB;;; 50% ostial OM2, 25% mLAD.  Marland Kitchen LEFT HEART CATH AND CORONARY ANGIOGRAPHY  2004   mildly depressed LV systolic fx EF 46%,[id number] coronaries/abdominal aorta/renal arteries.  Marland Kitchen NM MYOCAR PERF WALL MOTION  02/21/2010   No ischemia or infarction.  EF 27%.  Marland Kitchen RADIOACTIVE SEED IMPLANT  2010   prostate cancer  . REVISION OF ARTERIOVENOUS GORETEX GRAFT Right 07/11/2019   Procedure: REVISION OF ARTERIOVENOUS GORETEX GRAFT RIGHT ARM WITH ARTEGRAFT;  Surgeon: Serafina Mitchell, MD;  Location: [place];  Service:  Vascular;  Laterality: Right;  . SHUNTOGRAM Right 07/14/2019   Procedure: Shuntogram;  Surgeon: Elam Dutch, MD;  Location: [place];  Service: Vascular;  Laterality: Right;  . THROMBECTOMY AND REVISION OF ARTERIOVENTOUS (AV) GORETEX  GRAFT Right 07/14/2019   Procedure: THROMBECTOMY AND REVISION OF ARTERIOVENTOUS (AV) GORETEX  GRAFT;  Surgeon: Elam Dutch, MD;  Location: [place];  Service: Vascular;  Laterality: Right;  . THROMBECTOMY W/ EMBOLECTOMY Left 02/26/2018   Procedure: THROMBECTOMY ARTERIOVENOUS GRAFT;  Surgeon: Angelia Mould, MD;  Location: [place];  Service: Vascular;  Laterality: Left;  . TRANSTHORACIC ECHOCARDIOGRAM  02/2012   [facility]) EF 45-50% with mild global HK.  Mild LVH,LA mod. dilated,mild-mod. 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 AI.  Marland Kitchen TRANSTHORACIC ECHOCARDIOGRAM  9/'18; 3/'19   a) Severely reduced EF.  25 from 30%.  GR 1 DD with diffuse ecchymosis. Mild AS & AI.  Mild LA/RA dilation; b) (post PEA arest):   Moderately dilated LV with moderate concentric virtually.  EF 15 to 20%.  GR 1 DD.  Paradoxical septal motion. Mild AI.  Mild LA dilation.  Poorly visualized RV.  Marland Kitchen TRANSTHORACIC ECHOCARDIOGRAM  02/2019   EF 20-25%.  Unable to assess diastolic function (A. Fib).  No LV apical thrombus.  Mild AI.  Mildly reduced RV function, however poorly visualized.  Marland Kitchen ULTRASOUND GUIDANCE FOR VASCULAR ACCESS  02/20/2018   Procedure: Ultrasound Guidance For Vascular Access;  Surgeon: Jettie Booze, MD;  Location: [place] CV LAB;  Service: Cardiovascular;;  . UPPER EXTREMITY VENOGRAPHY N/A 07/08/2018   Procedure: UPPER EXTREMITY VENOGRAPHY;  Surgeon: Waynetta Sandy, MD;  Location: [place] CV LAB;  Service: Cardiovascular;  Laterality: N/A;  Bilateral     reports that he quit smoking about 14 years ago. His smoking use included cigarettes. He has a 30.00 pack-year smoking history. He has never used smokeless tobacco. He reports  current alcohol use of about 1.0 standard drinks of alcohol per week. He reports that he does not use drugs.  Allergies  Allergen Reactions  . Dextromethorphan-Guaifenesin     Other reaction(s): Unknown  . Tocotrienols     Other reaction(s): Unknown  . Losartan Potassium Other (See Comments)    Causes constipation  Other reaction(s): Unknown    Family History  Problem Relation Age of Onset  . Hypertension Mother   . Thyroid disease Mother   . Cholelithiasis Daughter   . Cholelithiasis Son   . Hypertension Maternal Grandmother   . Diabetes Maternal Grandmother   . Heart attack Neg Hx   . Hyperlipidemia Neg Hx      Prior to Admission medications   Medication Sig Start Date End Date Taking? Authorizing Provider  acetaminophen (TYLENOL) 500 MG tablet Take 1,000 mg by mouth every 6 (six) hours as needed for moderate pain.    [provider]  allopurinol (ZYLOPRIM) 100 MG tablet 1 tablet by mouth 3x weekly after dialysis. 09/15/19   [name], NP  amiodarone (PACERONE) 200 MG tablet Take 1 tablet (200 mg total) by mouth daily. 05/12/19   Croitoru, Mihai, MD  bictegravir-emtricitabine-tenofovir AF ([name]) 50-200-25 MG TABS tablet Take 1 tablet by mouth daily. 09/29/19   Truman Hayward, MD  brimonidine (ALPHAGAN) 0.15 % ophthalmic solution Place 1 drop into the left eye 2 (two) times daily. 11/15/17   [provider]  dorzolamide-timolol (COSOPT) 22.3-6.8 MG/ML ophthalmic solution Place 1 drop into the left eye 2 (two) times daily. 12/17/17   [provider]  ferric citrate (AURYXIA) 1 GM 210 MG(Fe) tablet Take 420 mg by mouth 3 (three) times daily with meals.    [provider]  latanoprost (XALATAN) 0.005 % ophthalmic solution Place 1 drop into the left eye at bedtime.     [provider]  loratadine (CLARITIN) 10 MG tablet Take 10 mg by mouth daily.    [provider]  midodrine (PROAMATINE) 10 MG tablet Take 1  tablet (10 mg total) by mouth as directed. Twice a day on the day of HD Tuesday, Thursday, Saturday (1 in AM and 1 at [place]). Take 1/2 tablet (5mg ) a day on non HD days Patient taking differently: Take 10 mg by mouth See admin instructions. Take 1 tablet (10 mg) by mouth 3 times daily on Tuesdays, Thursdays, & Saturdays. 04/22/19   Leonie Man, MD  montelukast (SINGULAIR) 10 MG tablet Take 1 tablet (10 mg total) by mouth at bedtime. 09/12/19   [name], NP  multivitamin (RENA-VIT) TABS  tablet Take 1 tablet by mouth daily at 2 PM.     [provider]  VELPHORO 500 MG chewable tablet  09/29/19   [provider]  warfarin (COUMADIN) 5 MG tablet Take 1/2 to 1 tablet by mouth daily as directed by coumadin clinic Patient taking differently: Take 2.5-5 mg by mouth See admin instructions. Take 1/2 tablet on Monday and Friday then take 1 tablet all the other days 07/25/19   Leonie Man, MD    Physical Exam: Vitals:   11/01/19 0130 11/01/19 0145 11/01/19 0200 11/01/19 0215  BP: (!) 147/90 (!) 152/88 (!) 150/83 (!) 148/79  Pulse: 98 98 94   Resp: 20 (!) 23 (!) 22 18  Temp:      SpO2: 100% 100% 100%   Weight:        Constitutional: NAD, calm  Eyes: PERTLA, lids and conjunctivae normal ENMT: Mucous membranes are moist. Posterior pharynx clear of any exudate or lesions.   Neck: normal, supple, no masses, no thyromegaly Respiratory: Tachypnea. Rales bilaterally. No pallor or cyanosis.  Cardiovascular: S1 & S2 heard, regular rate and rhythm. No extremity edema.   Abdomen: No distension, no tenderness, soft. Bowel sounds active.  Musculoskeletal: no clubbing / cyanosis. No joint deformity upper and lower extremities.   Skin: no significant rashes, lesions, ulcers. Warm, dry, well-perfused. Neurologic: No facial asymmetry. Sensation intact. Moving all extremities.  Psychiatric: Alert and oriented to person, place, and situation. Pleasant, cooperative.     Labs on  Admission: I have personally reviewed following labs and imaging studies  CBC: Recent Labs  Lab 11/01/19 0059  WBC 10.0  NEUTROABS 7.1  HGB 11.8*  HCT 36.0*  MCV 111.1*  PLT 149   Basic Metabolic Panel: Recent Labs  Lab 11/01/19 0059  NA 143  K 5.7*  CL 103  CO2 22  GLUCOSE 125*  BUN 56*  CREATININE 11.92*  CALCIUM 10.1   GFR: Estimated Creatinine Clearance: 6 mL/min (A) (by C-G formula based on SCr of 11.92 mg/dL (H)). Liver Function Tests: No results for input(s): AST, ALT, ALKPHOS, BILITOT, PROT, ALBUMIN in the last 168 hours. No results for input(s): LIPASE, AMYLASE in the last 168 hours. No results for input(s): AMMONIA in the last 168 hours. Coagulation Profile: Recent Labs  Lab 10/27/19  INR 3.3*   Cardiac Enzymes: No results for input(s): CKTOTAL, CKMB, CKMBINDEX, TROPONINI in the last 168 hours. BNP (last 3 results) No results for input(s): PROBNP in the last 8760 hours. HbA1C: No results for input(s): HGBA1C in the last 72 hours. CBG: No results for input(s): GLUCAP in the last 168 hours. Lipid Profile: No results for input(s): CHOL, HDL, LDLCALC, TRIG, CHOLHDL, LDLDIRECT in the last 72 hours. Thyroid Function Tests: No results for input(s): TSH, T4TOTAL, FREET4, T3FREE, THYROIDAB in the last 72 hours. Anemia Panel: No results for input(s): VITAMINB12, FOLATE, FERRITIN, TIBC, IRON, RETICCTPCT in the last 72 hours. Urine analysis:    Component Value Date/Time   COLORURINE YELLOW 02/17/2018 0540   APPEARANCEUR CLOUDY (A) 02/17/2018 0540   LABSPEC 1.010 02/17/2018 0540   PHURINE 5.0 02/17/2018 0540   GLUCOSEU NEGATIVE 02/17/2018 0540   HGBUR LARGE (A) 02/17/2018 0540   BILIRUBINUR NEGATIVE 02/17/2018 0540   BILIRUBINUR negative 04/16/2013 1447   KETONESUR NEGATIVE 02/17/2018 0540   PROTEINUR 100 (A) 02/17/2018 0540   UROBILINOGEN 0.2 05/11/2014 1352   NITRITE NEGATIVE 02/17/2018 0540   LEUKOCYTESUR NEGATIVE 02/17/2018 0540   Sepsis Labs:  @[email or web address] (procalcitonin:4,lacticidven:4) )No results found  for this or any previous visit (from the past 240 hour(s)).   Radiological Exams on Admission: Dg Chest Port 1 View  Result Date: 11/01/2019 CLINICAL DATA:  Shortness of breath EXAM: PORTABLE CHEST 1 VIEW COMPARISON:  Radiograph 10/21/2019 FINDINGS: Dual lumen dialysis catheter tip terminates at the superior cavoatrial junction. There is diffuse hazy interstitial opacities throughout the lungs with cephalized, indistinct pulmonary vascularity as well as fissural and septal thickening, increased from comparison exam. Cardiomegaly is similar to prior. No pneumothorax or visible effusion the portion of the right costophrenic sulcus is collimated from view. Postsurgical changes are noted in the left axilla. No acute osseous or soft tissue abnormality. IMPRESSION: 1. Findings consistent with worsening CHF/volume overload. 2. Stable cardiomegaly. 3. Dual lumen dialysis catheter tip terminates at the superior cavoatrial junction. No pneumothorax or visible pleural effusion. Electronically Signed   By: Lovena Le M.D.   On: 11/01/2019 01:17    EKG: Independently reviewed. Sinus rhythm, chronic LBBB.   Assessment/Plan   1. Pulmonary edema; acute hypoxic respiratory failure; ESRD; hyperkalemia  - Presents with acute-onset SOB, found to be hypoxic with pulmonary edema and hyperkalemia to 5.7  - He denies chest pain, fever/chills, or dietary indiscretions - Last dialyzed 11/25 per patient report  - Nephrology consulting and much appreciated, planning for HD this am  - SLIV, continue cardiac monitoring, serial potassium levels until dialyzed, renally-dose medications    2. NICM  - Presents with acute-onset SOB, found to be hypoxic with pulmonary edema  - He reports occasional small volume urine and was given Lasix in ED  - SLIV, fluid-restrict diet, volume-management per nephrology    3. HIV  - CD4 was 413 and VL undetectable in October  2020  - Continue [name]    4. Paroxysmal atrial fibrillation  - In sinus rhythm on admission  - CHADS-VASc is at least 22 (age x2, CHF) - Check INR, continue warfarin with pharmacy assistance     PPE: Mask, face shield  DVT prophylaxis: warfarin   Code Status: Full  Family Communication: Discussed with patient  Consults called: Nephrology consulted by ED physician  Admission status: Observation     Vianne Bulls, MD [facility] Pager [phone]  If 7PM-7AM, please contact night-coverage [email or web address] Password [facility]  11/01/2019, 2:53 AM

## 2019-11-01 NOTE — Procedures (Signed)
Pt's breathing is better, BP's dropping into 80's though, will have to lower goal.   I was present at this dialysis session, have reviewed the session itself and made  appropriate changes Kelly Splinter MD [place] pager [phone]   11/01/2019, 4:25 PM

## 2019-11-01 NOTE — ED Triage Notes (Signed)
The pt arrived by gems from home dialysis pt that is c/io sob  He arrived on c-pap  Dialysis cath rt upper chest  Rt arm restricted

## 2019-11-01 NOTE — ED Notes (Signed)
placedd on a non-rebreather by resp therapy

## 2019-11-01 NOTE — ED Notes (Signed)
Nasal 02 at 3  Vitals good pt in no distress

## 2019-11-01 NOTE — ED Provider Notes (Signed)
[facility] EMERGENCY DEPARTMENT Provider Note   CSN: [id number] Arrival date & time: 11/01/19  0102     History   Chief Complaint Chief Complaint  Patient presents with   Shortness of Breath  Level 5 caveat due to acuity of condition  HPI David [name] is a 78 y.o. male.     The history is provided by the patient and the EMS personnel. The history is limited by the condition of the patient.  Shortness of Breath Severity:  Severe Onset quality:  Sudden Timing:  Constant Progression:  Worsening Chronicity:  New Relieved by:  Nothing Worsened by:  Nothing Patient with history of ESRD, CHF presents with shortness of breath. Patient had reported sudden onset of shortness of breath about 90 minutes prior to arrival.  EMS reported patient was hypoxic and tachypneic and had crackles bilaterally.  Patient was given nitroglycerin and placed on CPAP.  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   Acute on chronic systolic and diastolic heart failure, NYHA class 4 (HCC)    Anemia, iron deficiency 11/15/2011   Arthritis    "hands, right knee, feet" (02/21/2018)   Atrial fibrillation (HCC)    Cancer (HCC)    hx of prostate; s/p radioactive seed implant 10/2009 Dr Janice Norrie   Cardiac arrest [facility]) 02/17/2018   CHF (congestive heart failure) (HCC)    Chronic combined systolic and diastolic heart failure, NYHA class 3 ([place]) 06/2010   felt to be secondary to hypertensive cardiomyopathy   Chronic lower back pain    CKD (chronic kidney disease) stage V requiring chronic dialysis ([place])    ESRD on dialysis ([place])    started 02/2018 David [name]   GERD (gastroesophageal reflux disease)    Glaucoma    "I had surgery and dont have it any more"   Gout    daily RX  (02/21/2018)   Heart murmur    "mild" per pt   Hepatitis    years ago   HIV (human immunodeficiency virus infection) ([place])    HIV infection ([place])    Hyperlipidemia    Hypertension    followed by [facility] and Vascular (Dr Dani Gobble Croitoru)   Hypertension    Nonischemic cardiomyopathy ([place])    Pneumonia 11/2017   Prostate cancer ([place])    Sinus bradycardia    Sleep apnea    does not use a cpap   Stroke ([place])    "mini stroke" years ago    Patient Active Problem List   Diagnosis Date Noted   Hyperkalemia 09/02/2019   Unspecified open wound of right upper arm, initial encounter 06/21/2019   Shortness of breath 04/12/2019   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   Dependence on renal dialysis ([place]) 03/27/2019   Hypertensive heart and chronic kidney disease with heart failure and with stage 5 chronic kidney disease, or end stage renal disease ([place]) 03/27/2019   Mesenteric ischemia (HCC)    Colonic ulcer    Right lower quadrant pain    Abnormal CT of the abdomen    Ischemic colitis ([place])    Colitis presumed infectious 03/09/2019   Acute respiratory failure with hypoxia and hypercapnia ([place]) 03/04/2019   Pruritus, unspecified 02/21/2019   Acute pulmonary edema ([place]) 02/11/2019   Pain, unspecified 01/21/2019   Atrial fibrillation with RVR ([place]) 12/05/2018  Paroxysmal atrial fibrillation (HCC) 11/16/2018   LBBB (left bundle branch block) 11/16/2018   Encounter for monitoring amiodarone therapy 11/16/2018   Volume overload 11/01/2018   Pulmonary edema 10/21/2018   Gout 10/21/2018   Hypertensive emergency 10/21/2018   Chronic combined systolic and diastolic heart failure, NYHA class 3 ([place]) 10/21/2018   Leukocytosis 10/21/2018   Chills (without fever) 09/24/2018   Gastroesophageal reflux disease 08/28/2018   Chronic anticoagulation 04/03/2018   CAD (coronary artery disease) 04/03/2018   Palliative care encounter 03/27/2018    Atrial fibrillation, chronic ([place]) 03/25/2018   Anemia in chronic kidney disease 03/12/2018   Coagulation defect, unspecified ([place]) 03/12/2018   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/12/2018   Palliative care by specialist    Advance care planning    Goals of care, counseling/discussion    Respiratory arrest ([place])    Acute on chronic systolic heart failure ([place])    Chronic obstructive pulmonary disease ([place])    HIV (human immunodeficiency virus infection) ([place])    ESRD (end stage renal disease) ([place])    Acute respiratory failure with hypoxia ([place]) 01/21/2018   Pneumonia    Aspiration pneumonia of both lower lobes due to regurgitated food ([place])    Sepsis due to pneumonia ([place]) 11/22/2017   AIDS ([place]) 05/14/2014   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   Gastroparesis 05/06/2014   Protein-calorie malnutrition, severe ([place]) 05/05/2014   ESRD on hemodialysis ([place]) 05/02/2014   Hemodialysis-associated hypotension 05/02/2014   Leaking of conjunctival drainage bleb 05/01/2014   POAG (primary open-angle glaucoma) 05/01/2014   Loss of weight 04/29/2014   Conjunctivitis 04/29/2014   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   Low back pain 04/09/2012   Osteoarthritis of hip 12/29/2011   Iron deficiency anemia 06/28/2011   Macrocytic anemia 04/02/2011   ADENOCARCINOMA, PROSTATE, 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   Hyperlipidemia 11/30/2010   Transient cerebral ischemia 11/30/2010   HYPERGLYCEMIA 11/30/2010    Past Surgical History:  Procedure Laterality Date   AV FISTULA PLACEMENT Right 10/13/2016   Procedure: ARTERIOVENOUS (AV) FISTULA CREATION;  Surgeon: [name], MD;  Location: [place] [facility];  Service: Vascular;  Laterality: Right;   AV FISTULA PLACEMENT Left 02/25/2018   Procedure: INSERTION OF ARTERIOVENOUS (AV) GORE-TEX GRAFT LEFT UPPER ARM;  Surgeon: Angelia Mould, MD;  Location: [place];  Service: Vascular;  Laterality: Left;   AV  FISTULA PLACEMENT Right 08/07/2018   Procedure: Creation of right arm brachiocephalic Fistula;  Surgeon: Waynetta Sandy, MD;  Location: [place];  Service: Vascular;  Laterality: Right;   [place] Left 06/24/2015   Procedure: BASILIC VEIN TRANSPOSITION;  Surgeon: Mal Misty, MD;  Location: [facility];  Service: Vascular;  Laterality: Left;   BIOPSY  03/14/2019   Procedure: BIOPSY;  Surgeon: Irene Shipper, MD;  Location: [facility] END[facility]COPY;  Service: Endoscopy;;   CATARACT EXTRACTION, BILATERAL Bilateral    COLONOSCOPY WITH PROPOFOL N/A 03/14/2019   Procedure: COLONOSCOPY WITH PROPOFOL;  Surgeon: Irene Shipper, MD;  Location: [place];  Service: Endoscopy;  Laterality: N/A;   ESOPHAGOGASTRODUODENOSCOPY (EGD) WITH PROPOFOL N/A 05/05/2014   Procedure: ESOPHAGOGASTRODUODENOSCOPY (EGD) WITH PROPOFOL;  Surgeon: Missy Sabins, MD;  Location: [facility] ENDOSCOPY;  Service: Endoscopy;  Laterality: N/A;   EYE SURGERY Bilateral    cataract surgery    GLAUCOMA SURGERY Bilateral    INSERTION OF ARTERIOVENOUS (AV) ARTEGRAFT ARM  10/09/2018   Procedure: INSERTION OF ARTERIOVENOUS (AV) 23mm x 41cm ARTEGRAFT LEFT UPPER ARM;  Surgeon: Waynetta Sandy, MD;  Location: [facility] OR;  Service: Vascular;;   INSERTION OF DIALYSIS CATHETER Right 07/14/2019   Procedure: Insertion Of Dialysis Catheter;  Surgeon: Elam Dutch, MD;  Location: [facility] OR;  Service: Vascular;  Laterality: Right;  placed right Internal Jugular   INSERTION PROSTATE RADIATION SEED     IR FLUORO GUIDE CV LINE RIGHT  02/18/2018   IR REMOVAL TUN CV CATH W/O FL  12/06/2018   IR US GUIDE VASC ACCESS RIGHT  02/18/2018   LEFT HEART CATH AND CORONARY ANGIOGRAPHY N/A 02/20/2018   Procedure: LEFT HEART CATH AND CORONARY ANGIOGRAPHY;  Surgeon: Jettie Booze, MD;  Location: [facility] CV LAB;;; 50% ostial OM2, 25% mLAD.   LEFT HEART CATH AND CORONARY ANGIOGRAPHY  2004   mildly depressed LV systolic fx EF 16%,[id number]  coronaries/abdominal aorta/renal arteries.   NM MYOCAR PERF WALL MOTION  02/21/2010   No ischemia or infarction.  EF 27%.   RADIOACTIVE SEED IMPLANT  2010   prostate cancer   REVISION OF ARTERIOVENOUS GORETEX GRAFT Right 07/11/2019   Procedure: REVISION OF ARTERIOVENOUS GORETEX GRAFT RIGHT ARM WITH ARTEGRAFT;  Surgeon: Serafina Mitchell, MD;  Location: [place];  Service: Vascular;  Laterality: Right;   SHUNTOGRAM Right 07/14/2019   Procedure: [place] Mal[place];  Surgeon: Elam Dutch, MD;  Location: [place];  Service: Vascular;  Laterality: Right;   THROMBECTOMY AND REVISION OF ARTERIOVENTOUS (AV) GORETEX  GRAFT Right 07/14/2019   Procedure: THROMBECTOMY AND REVISION OF ARTERIOVENTOUS (AV) GORETEX  GRAFT;  Surgeon: Elam Dutch, MD;  Location: [facility];  Service: Vascular;  Laterality: Right;   THROMBECTOMY W/ EMBOLECTOMY Left 02/26/2018   Procedure: THROMBECTOMY ARTERIOVENOUS GRAFT;  Surgeon: Angelia Mould, MD;  Location: [place];  Service: Vascular;  Laterality: Left;   TRANSTHORACIC ECHOCARDIOGRAM  02/2012   ([place]) EF 45-50% with mild global HK.  Mild LVH,LA mod. dilated,mild-mod. 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 AI.   TRANSTHORACIC ECHOCARDIOGRAM  9/'18; 3/'19   a) Severely reduced EF.  25 from 30%.  GR 1 DD with diffuse ecchymosis. Mild AS & AI.  Mild LA/RA dilation; b) (post PEA arest):   Moderately dilated LV with moderate concentric virtually.  EF 15 to 20%.  GR 1 DD.  Paradoxical septal motion. Mild AI.  Mild LA dilation.  Poorly visualized RV.   TRANSTHORACIC ECHOCARDIOGRAM  02/2019   EF 20-25%.  Unable to assess diastolic function (A. Fib).  No LV apical thrombus.  Mild AI.  Mildly reduced RV function, however poorly visualized.   ULTRASOUND GUIDANCE FOR VASCULAR ACCESS  02/20/2018   Procedure: Ultrasound Guidance For Vascular Access;  Surgeon: Jettie Booze, MD;  Location: [place] CV LAB;  Service: Cardiovascular;;   UPPER EXTREMITY  VENOGRAPHY N/A 07/08/2018   Procedure: UPPER EXTREMITY VENOGRAPHY;  Surgeon: Waynetta Sandy, MD;  Location: [place] CV LAB;  Service: Cardiovascular;  Laterality: N/A;  Bilateral        Home Medications    Prior to Admission medications   Medication Sig Start Date End Date Taking? Authorizing Provider  acetaminophen (TYLENOL) 500 MG tablet Take 1,000 mg by mouth every 6 (six) hours as needed for moderate pain.    [provider]  allopurinol (ZYLOPRIM) 100 MG tablet 1 tablet by mouth 3x weekly after dialysis. 09/15/19   [name], NP  amiodarone (PACERONE) 200 MG tablet Take 1 tablet (200 mg total) by mouth daily. 05/12/19   Croitoru, Mihai, MD  bictegravir-emtricitabine-tenofovir AF ([name]) 50-200-25 MG  TABS tablet Take 1 tablet by mouth daily. 09/29/19   Truman Hayward, MD  brimonidine (ALPHAGAN) 0.15 % ophthalmic solution Place 1 drop into the left eye 2 (two) times daily. 11/15/17   [provider]  dorzolamide-timolol (COSOPT) 22.3-6.8 MG/ML ophthalmic solution Place 1 drop into the left eye 2 (two) times daily. 12/17/17   [provider]  ferric citrate (AURYXIA) 1 GM 210 MG(Fe) tablet Take 420 mg by mouth 3 (three) times daily with meals.    [provider]  latanoprost (XALATAN) 0.005 % ophthalmic solution Place 1 drop into the left eye at bedtime.     [provider]  loratadine (CLARITIN) 10 MG tablet Take 10 mg by mouth daily.    [provider]  midodrine (PROAMATINE) 10 MG tablet Take 1 tablet (10 mg total) by mouth as directed. Twice a day on the day of HD Tuesday, Thursday, Saturday (1 in AM and 1 at [place]). Take 1/2 tablet (5mg ) a day on non HD days Patient taking differently: Take 10 mg by mouth See admin instructions. Take 1 tablet (10 mg) by mouth 3 times daily on Tuesdays, Thursdays, & Saturdays. 04/22/19   Leonie Man, MD  montelukast (SINGULAIR) 10 MG tablet Take 1 tablet (10 mg total)  by mouth at bedtime. 09/12/19   [name], NP  multivitamin (RENA-VIT) TABS tablet Take 1 tablet by mouth daily at 2 PM.     [provider]  VELPHORO 500 MG chewable tablet  09/29/19   [provider]  warfarin (COUMADIN) 5 MG tablet Take 1/2 to 1 tablet by mouth daily as directed by coumadin clinic Patient taking differently: Take 2.5-5 mg by mouth See admin instructions. Take 1/2 tablet on Monday and Friday then take 1 tablet all the other days 07/25/19   Leonie Man, MD    Family History Family History  Problem Relation Age of Onset   Hypertension Mother    Thyroid disease Mother    Cholelithiasis Daughter    Cholelithiasis Son    Hypertension Maternal Grandmother    Diabetes Maternal Grandmother    Heart attack Neg Hx    Hyperlipidemia Neg Hx     Social History Social History   Tobacco Use   Smoking status: Former Smoker    Packs/day: 1.00    Years: 30.00    Pack years: 30.00    Types: Cigarettes    Quit date: 2006    Years since quitting: 14.9   Smokeless tobacco: Never Used  Substance Use Topics   Alcohol use: Yes    Alcohol/week: 1.0 standard drinks    Types: 1 Shots of liquor per week   Drug use: Never     Allergies   Dextromethorphan-guaifenesin, Tocotrienols, and Losartan potassium   Review of Systems Review of Systems  Unable to perform ROS: Acuity of condition  Respiratory: Positive for shortness of breath.      Physical Exam Updated Vital Signs BP (!) 164/98    Pulse (!) 106    Temp 98.4 F (36.9 C)    Resp (!) 25    Wt 83.9 kg    SpO2 100%    BMI 23.75 kg/m   Physical Exam  CONSTITUTIONAL: Elderly, chronically ill-appearing, distress noted HEAD: Normocephalic/atraumatic EYES: EOMI/PERRL ENMT: CPAP mask in place NECK: supple no meningeal signs SPINE/BACK:entire spine nontender CV: Tachycardic LUNGS: Crackles bilaterally, tachypnea ABDOMEN: soft, nontender NEURO: Pt is awake/alert/appropriate,  moves all extremitiesx4.  No facial droop.  EXTREMITIES: pulses normal/equal, full ROM SKIN: warm, color normal, dialysis catheter to right upper chest PSYCH: Anxious   ED Treatments / Results  Labs (all labs ordered are listed, but only abnormal results are displayed) Labs Reviewed  BASIC METABOLIC PANEL - Abnormal; Notable for the following components:      Result Value   Potassium 5.7 (*)    Glucose, Bld 125 (*)    BUN 56 (*)    Creatinine, Ser 11.92 (*)    GFR calc non Af Amer 4 (*)    GFR calc Af Amer 4 (*)    Anion gap 18 (*)    All other components within normal limits  CBC WITH DIFFERENTIAL/PLATELET - Abnormal; Notable for the following components:   RBC 3.24 (*)    Hemoglobin 11.8 (*)    HCT 36.0 (*)    MCV 111.1 (*)    MCH 36.4 (*)    Eosinophils Absolute 0.6 (*)    All other components within normal limits  BRAIN NATRIURETIC PEPTIDE - Abnormal; Notable for the following components:   B Natriuretic Peptide 795.5 (*)    All other components within normal limits  SARS CORONAVIRUS 2 (TAT 6-24 HRS)  PROTIME-INR  POC SARS CORONAVIRUS 2 AG -  ED    EKG EKG Interpretation  Date/Time:  Saturday November 01 2019 01:28:27 EST Ventricular Rate:  98 PR Interval:    QRS Duration: 141 QT Interval:  398 QTC Calculation: 509 R Axis:   -68 Text Interpretation: Sinus rhythm Borderline prolonged PR interval Probable left atrial enlargement Left bundle branch block No significant change since last tracing Confirmed by Ripley Fraise [phone]) on 11/01/2019 1:50:34 AM   Radiology Dg Chest Port 1 View  Result Date: 11/01/2019 CLINICAL DATA:  Shortness of breath EXAM: PORTABLE CHEST 1 VIEW COMPARISON:  Radiograph 10/21/2019 FINDINGS: Dual lumen dialysis catheter tip terminates at the superior cavoatrial junction. There is diffuse hazy interstitial opacities throughout the lungs with cephalized, indistinct pulmonary vascularity as well as fissural and septal thickening,  increased from comparison exam. Cardiomegaly is similar to prior. No pneumothorax or visible effusion the portion of the right costophrenic sulcus is collimated from view. Postsurgical changes are noted in the left axilla. No acute osseous or soft tissue abnormality. IMPRESSION: 1. Findings consistent with worsening CHF/volume overload. 2. Stable cardiomegaly. 3. Dual lumen dialysis catheter tip terminates at the superior cavoatrial junction. No pneumothorax or visible pleural effusion. Electronically Signed   By: Lovena Le M.D.   On: 11/01/2019 01:17    Procedures .Critical Care Performed by: Ripley Fraise, MD Authorized by: Ripley Fraise, MD   Critical care provider statement:    Critical care time (minutes):  37   Critical care start time:  11/01/2019 1:53 AM   Critical care end time:  11/01/2019 2:25 AM   Critical care time was exclusive of:  Separately billable procedures and treating other patients   Critical care was necessary to treat or prevent imminent or life-threatening deterioration of the following conditions:  Respiratory failure and renal failure   Critical care was time spent personally by me on the following activities:  Ordering and review of radiographic studies, ordering and review of laboratory studies, pulse oximetry, re-evaluation of patient's condition, ordering and performing treatments and interventions, evaluation of patient's response to treatment, discussions with consultants, examination of patient and development of treatment plan with patient or surrogate   I assumed direction of critical care for this patient from another provider in my specialty: no  Medications Ordered in ED Medications  nitroGLYCERIN 50 mg in dextrose 5 % 250 mL (0.2 mg/mL) infusion (0 mcg/min Intravenous Hold 11/01/19 0211)  furosemide (LASIX) injection 40 mg (40 mg Intravenous Given 11/01/19 0212)     Initial Impression / Assessment and Plan / ED Course  I have reviewed  the triage vital signs and the nursing notes.  Pertinent labs & imaging results that were available during my care of the patient were reviewed by me and considered in my medical decision making (see chart for details).        1:53 AM Patient seen on arrival for respiratory distress.  Patient's history and exam is consistent with volume overload/pulmonary edema. Patient is now been taken off of noninvasive ventilation appears to be improving.  He reports he had felt well until about 90 minutes prior to arrival Patient will require dialysis tonight.  Consult to nephrology 2:25 AM Discussed with Dr. Royce Macadamia from nephrology.  She is arranging dialysis for later in the morning.  She does not recommend acute treatment for his mild hyperkalemia at this time Will admit to the hospitalist as patient does have an oxygen requirement 2:40 AM Discussed with Dr. Myna Hidalgo for admission to the hospital. Patient to be admitted for acute pulmonary edema/respiratory failure with hypoxia.  [facility] was evaluated in Emergency Department on 11/01/2019 for the symptoms described in the history of present illness. He was evaluated in the context of the global COVID-19 pandemic, which necessitated consideration that the patient might be at risk for infection with the SARS-CoV-2 virus that causes COVID-19. Institutional protocols and algorithms that pertain to the evaluation of patients at risk for COVID-19 are in a state of rapid change based on information released by regulatory bodies including the CDC and federal and state organizations. These policies and algorithms were followed during the patient's care in the ED.   Final Clinical Impressions(s) / ED Diagnoses   Final diagnoses:  Acute respiratory failure with hypoxia ([place])  ESRD (end stage renal disease) ([place])  Acute pulmonary edema [facility])    ED Discharge Orders    None       Ripley Fraise, MD 11/01/19 [phone]

## 2019-11-01 NOTE — Progress Notes (Signed)
ANTICOAGULATION CONSULT NOTE - Initial Consult  Pharmacy Consult for Coumadin Indication: atrial fibrillation  Allergies  Allergen Reactions  . Dextromethorphan-Guaifenesin     Other reaction(s): Unknown  . Tocotrienols     Other reaction(s): Unknown  . Losartan Potassium Other (See Comments)    Causes constipation  Other reaction(s): Unknown    Patient Measurements: Weight: 185 lb (83.9 kg)  Vital Signs: Temp: 98.4 F (36.9 C) (11/28 0111) BP: 148/79 (11/28 0215) Pulse Rate: 94 (11/28 0200)  Labs: Recent Labs    11/01/19 0059  HGB 11.8*  HCT 36.0*  PLT 192  CREATININE 11.92*    Estimated Creatinine Clearance: 6 mL/min (A) (by C-G formula based on SCr of 11.92 mg/dL (H)).   Medical History: Past Medical History:  Diagnosis Date  . 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 Acute on chronic systolic and diastolic heart failure, NYHA class 4 ([place])   . Anemia, iron deficiency 11/15/2011  . Arthritis    "hands, right knee, feet" (02/21/2018)  . Atrial fibrillation ([place])   . Cancer [facility])    hx of prostate; s/p radioactive seed implant 10/2009 Dr Janice Norrie  . Cardiac arrest ([place]) 02/17/2018  . CHF (congestive heart failure) ([place])   . Chronic combined systolic and diastolic heart failure, NYHA class 3 ([place]) 06/2010   felt to be secondary to hypertensive cardiomyopathy  . Chronic lower back pain   . CKD (chronic kidney disease) stage V requiring chronic dialysis ([place])   . ESRD on dialysis [facility])    started 02/2018 [place]  . GERD (gastroesophageal reflux disease)   . Glaucoma    "I had surgery and dont have it any more"  . Gout    daily RX (02/21/2018)  . Heart murmur    "mild" per pt  . Hepatitis    years ago  . HIV (human immunodeficiency virus infection) ([place])   . HIV infection ([place])   . Hyperlipidemia   . Hypertension    followed by [facility] and Vascular (Dr Dani Gobble Croitoru)  . Hypertension   . Nonischemic cardiomyopathy  ([place])   . Pneumonia 11/2017  . Prostate cancer ([place])   . Sinus bradycardia   . Sleep apnea    does not use a cpap  . Stroke [facility])    "mini stroke" years ago    Assessment: 78yo male c/o SOB, to continue Coumadin during admission.  Goal of Therapy:  INR 2-3   Plan:  Will plan to continue home Coumadin regimen of 2.5mg  Mon/Fri and 5mg  TWTSS and monitor INR.  Wynona Neat, PharmD, BCPS  11/01/2019,3:00 AM

## 2019-11-02 DIAGNOSIS — J9601 Acute respiratory failure with hypoxia: Secondary | ICD-10-CM | POA: Diagnosis not present

## 2019-11-02 DIAGNOSIS — N186 End stage renal disease: Secondary | ICD-10-CM | POA: Diagnosis not present

## 2019-11-02 DIAGNOSIS — I480 Paroxysmal atrial fibrillation: Secondary | ICD-10-CM | POA: Diagnosis not present

## 2019-11-02 DIAGNOSIS — Z21 Asymptomatic human immunodeficiency virus [HIV] infection status: Secondary | ICD-10-CM | POA: Diagnosis not present

## 2019-11-02 DIAGNOSIS — J810 Acute pulmonary edema: Secondary | ICD-10-CM | POA: Diagnosis not present

## 2019-11-02 LAB — BASIC METABOLIC PANEL
Anion gap: 16 — ABNORMAL HIGH (ref 5–15)
BUN: 31 mg/dL — ABNORMAL HIGH (ref 8–23)
CO2: 26 mmol/L (ref 22–32)
Calcium: 8.9 mg/dL (ref 8.9–10.3)
Chloride: 99 mmol/L (ref 98–111)
Creatinine, Ser: 7.89 mg/dL — ABNORMAL HIGH (ref 0.61–1.24)
GFR calc Af Amer: 7 mL/min — ABNORMAL LOW (ref >60)
GFR calc non Af Amer: 6 mL/min — ABNORMAL LOW (ref >60)
Glucose, Bld: 81 mg/dL (ref 70–99)
Potassium: 4.7 mmol/L (ref 3.5–5.1)
Sodium: 141 mmol/L (ref 135–145)

## 2019-11-02 LAB — PROTIME-INR
INR: 1.7 — ABNORMAL HIGH (ref 0.8–1.2)
Prothrombin Time: 19.7 seconds — ABNORMAL HIGH (ref 11.4–15.2)

## 2019-11-02 NOTE — Progress Notes (Signed)
ANTICOAGULATION CONSULT NOTE - Initial Consult  Pharmacy Consult for Coumadin Indication: atrial fibrillation  Allergies  Allergen Reactions  . Dextromethorphan-Guaifenesin     Other reaction(s): Unknown  . Tocotrienols     Other reaction(s): Unknown  . Losartan Potassium Other (See Comments)    Causes constipation  Other reaction(s): Unknown    Patient Measurements: Height: 6\' 3"  (190.5 cm) Weight: 183 lb (83 kg) IBW/kg (Calculated) : 84.5  Vital Signs: Temp: 97.7 F (36.5 C) (11/29 0823) Temp Source: Oral (11/29 0823) BP: 130/80 (11/29 0823) Pulse Rate: 75 (11/29 0823)  Labs: Recent Labs    11/01/19 0059 11/01/19 0128 11/01/19 0603 11/02/19 0259  HGB 11.8* 12.6*  --   --   HCT 36.0* 37.0*  --   --   PLT 192  --   --   --   LABPROT  --   --  20.5* 19.7*  INR  --   --  1.8* 1.7*  CREATININE 11.92*  --   --  7.89*    Estimated Creatinine Clearance: 9.2 mL/min (A) (by C-G formula based on SCr of 7.89 mg/dL (H)).   Medical History: Past Medical History:  Diagnosis Date  . 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 Acute on chronic systolic and diastolic heart failure, NYHA class 4 ([place])   . Anemia, iron deficiency 11/15/2011  . Arthritis    "hands, right knee, feet" (02/21/2018)  . Atrial fibrillation ([place])   . Cancer [facility])    hx of prostate; s/p radioactive seed implant 10/2009 Dr Janice Norrie  . Cardiac arrest ([place]) 02/17/2018  . CHF (congestive heart failure) ([place])   . 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 Chronic lower back pain   . CKD (chronic kidney disease) stage V requiring chronic dialysis ([place])   . ESRD on dialysis [facility])    started 02/2018 [place]  . GERD (gastroesophageal reflux disease)   . Glaucoma    "I had surgery and dont have it any more"  . Gout    daily RX (02/21/2018)  . Heart murmur    "mild" per pt  . Hepatitis     years ago  . HIV (human immunodeficiency virus infection) ([place])   . HIV infection ([place])   . Hyperlipidemia   . Hypertension    followed by [facility] (Dr Dani Gobble Croitoru)  . Hypertension   . Nonischemic cardiomyopathy ([place])   . Pneumonia 11/2017  . Prostate cancer ([place])   . Sinus bradycardia   . Sleep apnea    does not use a cpap  . Stroke [facility])    "mini stroke" years ago    Assessment: 78yo male presents with SOB to continue PTA warfarin for Atrial Fibrillation. PTA regimen is warfarin 2.5 mg on Mon/Fri, 5 mg all other days. INR upon admission was 1.9. Per records, last clinic INR on 11/23 was 3.3 on this regimen and patient was supposed to hold one dose. Also on PO Amiodarone that was continued from PTA.  INR today is down to 1.7 after continuing PTA regimen last night. No overt bleeding noted. Given that patient's INR was high at last outpatient visit on this regimen, will continue current regimen for one more dose today and see if adjustment is needed tomorrow.  Goal of Therapy:  INR 2-3 Monitor platelets by anticoagulation protocol: Yes  Plan:  Continue PTA regimen of warfarin 2.5mg   Mon/Fri and 5mg  TWTSS Daily INR Monitor signs of bleeding  Richardine Service, PharmD PGY1 Pharmacy Resident Phone: [phone] 11/02/2019  8:43 AM  Please check [email or web address] for unit-specific pharmacy phone numbers.

## 2019-11-02 NOTE — Discharge Summary (Signed)
Physician Discharge Summary  [facility] OEV:[id number] DOB: [birth year] DOA: 11/01/2019  PCP: [name], NP  Admit date: 11/01/2019 Discharge date: 11/02/2019  Admitted From: Home Disposition: Home  Recommendations for Outpatient Follow-up:  1. Follow-up at outpatient dialysis center per regular schedule on 12/1 for next treatment.  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  Patient's last dialysis was on 11/25 and had gotten off schedule due to the holidays.  The patient was admitted to the hospital and underwent dialysis without incident.  His overall shortness of breath has resolved and is breathing comfortably on room air.  He is able to ambulate without difficulty.  He was noted to have mild hyperkalemia which is since corrected with dialysis.  The patient is feeling significantly improved and is requesting discharge home.  He was seen by nephrology who also agrees with discharge plan today.  He will follow-up with his outpatient dialysis center on 12/1 for his regularly scheduled dialysis.  The remainder of his medical problems remained stable.  Discharge Diagnoses:  Principal Problem:   Acute respiratory failure with hypoxia (HCC) Active Problems:   Nonischemic dilated cardiomyopathy (HCC)   ESRD on hemodialysis (HCC)   HIV (human immunodeficiency virus infection) ([place])   ESRD (end stage renal disease) ([place])   Volume overload   Paroxysmal atrial fibrillation (HCC)   Hyperkalemia   Acute respiratory failure with hypoxemia [facility])    Discharge Instructions  Discharge Instructions    Diet - low sodium heart healthy   Complete by: As directed    Increase activity slowly   Complete by: As  directed      Allergies as of 11/02/2019      Reactions   Dextromethorphan-guaifenesin    Other reaction(s): Unknown   Tocotrienols    Other reaction(s): Unknown   Losartan Potassium Other (See Comments)   Causes constipation Other reaction(s): Unknown      Medication List    TAKE these medications   acetaminophen 500 MG tablet Commonly known as: TYLENOL Take 1,000 mg by mouth every 6 (six) hours as needed for moderate pain.   allopurinol 100 MG tablet Commonly known as: ZYLOPRIM 1 tablet by mouth 3x weekly after dialysis.   amiodarone 200 MG tablet Commonly known as: PACERONE Take 1 tablet (200 mg total) by mouth daily.   Auryxia 1 GM 210 MG(Fe) tablet Generic drug: ferric citrate Take 420 mg by mouth 3 (three) times daily with meals.   [name] 50-200-25 MG Tabs tablet Generic drug: bictegravir-emtricitabine-tenofovir AF Take 1 tablet by mouth daily.   brimonidine 0.15 % ophthalmic solution Commonly known as: ALPHAGAN Place 1 drop into the left eye 2 (two) times daily.   dorzolamide-timolol 22.3-6.8 MG/ML ophthalmic solution Commonly known as: COSOPT Place 1 drop into the left eye 2 (two) times daily.   latanoprost 0.005 % ophthalmic solution Commonly known as: XALATAN Place 1 drop into the left eye at bedtime.   loratadine 10 MG tablet Commonly known as: CLARITIN Take 10 mg by mouth daily.   midodrine 10 MG tablet Commonly known as: PROAMATINE Take 1 tablet (10 mg total) by mouth as directed. Twice a day on the day of HD Tuesday, Thursday, Saturday (1 in AM and 1 at [place]). Take 1/2 tablet (5mg ) a day on non HD days  What changed:   when to take this  additional instructions   montelukast 10 MG tablet Commonly known as: SINGULAIR Take 1 tablet (10 mg total) by mouth at bedtime.   multivitamin Tabs tablet Take 1 tablet by mouth daily at 2 PM.   Velphoro 500 MG chewable tablet Generic drug: sucroferric oxyhydroxide Chew 500 mg by mouth 3 (three)  times daily with meals.   warfarin 5 MG tablet Commonly known as: COUMADIN Take as directed. If you are unsure how to take this medication, talk to your nurse or doctor. Original instructions: Take 1/2 to 1 tablet by mouth daily as directed by coumadin clinic What changed:   how much to take  how to take this  when to take this  additional instructions       Allergies  Allergen Reactions  . Dextromethorphan-Guaifenesin     Other reaction(s): Unknown  . Tocotrienols     Other reaction(s): Unknown  . Losartan Potassium Other (See Comments)    Causes constipation  Other reaction(s): Unknown    Consultations:  Nephrology   Procedures/Studies: Dg Chest Port 1 View  Result Date: 11/01/2019 CLINICAL DATA:  Shortness of breath EXAM: PORTABLE CHEST 1 VIEW COMPARISON:  Radiograph 10/21/2019 FINDINGS: Dual lumen dialysis catheter tip terminates at the superior cavoatrial junction. There is diffuse hazy interstitial opacities throughout the lungs with cephalized, indistinct pulmonary vascularity as well as fissural and septal thickening, increased from comparison exam. Cardiomegaly is similar to prior. No pneumothorax or visible effusion the portion of the right costophrenic sulcus is collimated from view. Postsurgical changes are noted in the left axilla. No acute osseous or soft tissue abnormality. IMPRESSION: 1. Findings consistent with worsening CHF/volume overload. 2. Stable cardiomegaly. 3. Dual lumen dialysis catheter tip terminates at the superior cavoatrial junction. No pneumothorax or visible pleural effusion. Electronically Signed   By: Lovena Le M.D.   On: 11/01/2019 01:17   Dg Chest Port 1 View  Result Date: 10/21/2019 CLINICAL DATA:  78 year old male with shortness of breath. Dialysis patient. EXAM: PORTABLE CHEST 1 VIEW COMPARISON:  Portable chest 09/02/2019 and earlier. FINDINGS: Portable AP semi upright view at 0018 hours. Stable tunneled right chest dialysis  catheter. Stable cardiomegaly and mediastinal contours. Stable lung volumes. Indistinct pulmonary vasculature with basilar predominant hazy and interstitial opacity not significantly changed compared to 09/02/2019. No superimposed pneumothorax or pleural effusion. No definite consolidation. No acute osseous abnormality identified. Negative visible bowel gas pattern. IMPRESSION: Basilar predominant pulmonary interstitial opacity not significantly changed compared to 09/02/2019 and favored due to pulmonary edema. Electronically Signed   By: Genevie Ann M.D.   On: 10/21/2019 00:36   Vas [place] Burnard Bunting With/wo Tbi  Result Date: 10/16/2019 LOWER EXTREMITY DOPPLER STUDY Indications: Pre-op access.  Vascular Interventions: Right AVF 10/13/16                         Left AVF 02/25/2018                         Right AVF 08/07/2018                         Left AVG 10/09/2018                         Right AVG 07/11/2019. Performing Technologist: Ronal Fear RVS, RCS  Examination Guidelines: A complete evaluation includes at minimum, Doppler  waveform signals and systolic blood pressure reading at the level of bilateral brachial, anterior tibial, and posterior tibial arteries, when vessel segments are accessible. Bilateral testing is considered an integral part of a complete examination. Photoelectric Plethysmograph (PPG) waveforms and toe systolic pressure readings are included as required and additional duplex testing as needed. Limited examinations for reoccurring indications may be performed as noted.  ABI Findings: +---------+------------------+-----+---------+--------+ Right    Rt Pressure (mmHg)IndexWaveform Comment  +---------+------------------+-----+---------+--------+ PTA      147               1.02 triphasic         +---------+------------------+-----+---------+--------+ DP       128               0.89 biphasic          +---------+------------------+-----+---------+--------+ Great Toe93                 0.65                   +---------+------------------+-----+---------+--------+ +---------+------------------+-----+---------+-------+ Left     Lt Pressure (mmHg)IndexWaveform Comment +---------+------------------+-----+---------+-------+ Brachial 144                                     +---------+------------------+-----+---------+-------+ PTA      151               1.05 triphasic        +---------+------------------+-----+---------+-------+ DP       177               1.23 triphasic        +---------+------------------+-----+---------+-------+ Great Toe98                0.68                  +---------+------------------+-----+---------+-------+ +-------+-----------+-----------+------------+------------+ ABI/TBIToday's ABIToday's TBIPrevious ABIPrevious TBI +-------+-----------+-----------+------------+------------+ Right  1.02       0.65                                +-------+-----------+-----------+------------+------------+ Left   1.23       0.68                                +-------+-----------+-----------+------------+------------+  Summary: Right: Resting right ankle-brachial index is within normal range. No evidence of significant right lower extremity arterial disease. The right toe-brachial index is abnormal. Left: Resting left ankle-brachial index is within normal range. No evidence of significant left lower extremity arterial disease. The left toe-brachial index is abnormal.  *See table(s) above for measurements and observations.  Electronically signed by Ruta Hinds MD on 10/16/2019 at 2:49:41 PM.    Final        Subjective: Patient is feeling better today after dialysis last night.  Reports that shortness of breath has resolved.  No chest pain.  Able to ambulate.  Discharge Exam: Vitals:   11/02/19 0014 11/02/19 0456 11/02/19 0553 11/02/19 0823  BP: 114/78 101/66  130/80  Pulse: 72 73  75  Resp: (!) 29 19  19   Temp: (!) 97.5 F  (36.4 C) 97.6 F (36.4 C)  97.7 F (36.5 C)  TempSrc: Tympanic Oral  Oral  SpO2: 97% 97%  97%  Weight:   83 kg  Height:        General: Pt is alert, awake, not in acute distress Cardiovascular: RRR, S1/S2 +, no rubs, no gallops Respiratory: CTA bilaterally, no wheezing, no rhonchi Abdominal: Soft, NT, ND, bowel sounds + Extremities: no edema, no cyanosis    The results of significant diagnostics from this hospitalization (including imaging, microbiology, ancillary and laboratory) are listed below for reference.     Microbiology: Recent Results (from the past 240 hour(s))  SARS CORONAVIRUS 2 (TAT 6-24 HRS) Nasopharyngeal Nasopharyngeal Swab     Status: None   Collection Time: 11/01/19  2:25 AM   Specimen: Nasopharyngeal Swab  Result Value Ref Range Status   SARS Coronavirus 2 NEGATIVE NEGATIVE Final    Comment: (NOTE) SARS-CoV-2 target nucleic acids are NOT DETECTED. The SARS-CoV-2 RNA is generally detectable in upper and lower respiratory specimens during the acute phase of infection. Negative results do not preclude SARS-CoV-2 infection, do not rule out co-infections with other pathogens, and should not be used as the sole basis for treatment or other patient management decisions. Negative results must be combined with clinical observations, patient history, and epidemiological information. The expected result is Negative. Fact Sheet for Patients: SugarRoll.be Fact Sheet for Healthcare Providers: https://www.woods-mathews.com/ This test is not yet approved or cleared by the [place] FDA and  has been authorized for detection and/or diagnosis of SARS-CoV-2 by FDA under an Emergency Use Authorization (EUA). This EUA will remain  in effect (meaning this test can be used) for the duration of the COVID-19 declaration under Section 56 4(b)(1) of the Act, 21 U.S.C. section 360bbb-3(b)(1), unless the authorization is terminated  or revoked sooner. Performed at [facility], [place] [address] [place], [place] [address]      Labs: BNP (last 3 results) Recent Labs    02/11/19 0015 03/04/19 0519 11/01/19 0059  BNP 1,054.2* 1,041.5* 308.6*   Basic Metabolic Panel: Recent Labs  Lab 11/01/19 0059 11/01/19 0128 11/01/19 0739 11/02/19 0259  NA 143 141  --  141  K 5.7* 5.3* 5.9* 4.7  CL 103  --   --  99  CO2 22  --   --  26  GLUCOSE 125*  --   --  81  BUN 56*  --   --  31*  CREATININE 11.92*  --   --  7.89*  CALCIUM 10.1  --   --  8.9   Liver Function Tests: No results for input(s): AST, ALT, ALKPHOS, BILITOT, PROT, ALBUMIN in the last 168 hours. No results for input(s): LIPASE, AMYLASE in the last 168 hours. No results for input(s): AMMONIA in the last 168 hours. CBC: Recent Labs  Lab 11/01/19 0059 11/01/19 0128  WBC 10.0  --   NEUTROABS 7.1  --   HGB 11.8* 12.6*  HCT 36.0* 37.0*  MCV 111.1*  --   PLT 192  --    Cardiac Enzymes: No results for input(s): CKTOTAL, CKMB, CKMBINDEX, TROPONINI in the last 168 hours. BNP: Invalid input(s): POCBNP CBG: No results for input(s): GLUCAP in the last 168 hours. D-Dimer No results for input(s): DDIMER in the last 72 hours. Hgb A1c No results for input(s): HGBA1C in the last 72 hours. Lipid Profile No results for input(s): CHOL, HDL, LDLCALC, TRIG, CHOLHDL, LDLDIRECT in the last 72 hours. Thyroid function studies No results for input(s): TSH, T4TOTAL, T3FREE, THYROIDAB in the last 72 hours.  Invalid input(s): FREET3 Anemia work up No results for input(s): VITAMINB12, FOLATE, FERRITIN, TIBC, IRON, RETICCTPCT  in the last 72 hours. Urinalysis    Component Value Date/Time   COLORURINE YELLOW 02/17/2018 0540   APPEARANCEUR CLOUDY (A) 02/17/2018 0540   LABSPEC 1.010 02/17/2018 0540   PHURINE 5.0 02/17/2018 0540   GLUCOSEU NEGATIVE 02/17/2018 0540   HGBUR LARGE (A) 02/17/2018 0540   BILIRUBINUR NEGATIVE 02/17/2018 0540   BILIRUBINUR negative  04/16/2013 1447   KETONESUR NEGATIVE 02/17/2018 0540   PROTEINUR 100 (A) 02/17/2018 0540   UROBILINOGEN 0.2 05/11/2014 1352   NITRITE NEGATIVE 02/17/2018 0540   LEUKOCYTESUR NEGATIVE 02/17/2018 0540   Sepsis Labs Invalid input(s): PROCALCITONIN,  WBC,  LACTICIDVEN Microbiology Recent Results (from the past 240 hour(s))  SARS CORONAVIRUS 2 (TAT 6-24 HRS) Nasopharyngeal Nasopharyngeal Swab     Status: None   Collection Time: 11/01/19  2:25 AM   Specimen: Nasopharyngeal Swab  Result Value Ref Range Status   SARS Coronavirus 2 NEGATIVE NEGATIVE Final    Comment: (NOTE) SARS-CoV-2 target nucleic acids are NOT DETECTED. The SARS-CoV-2 RNA is generally detectable in upper and lower respiratory specimens during the acute phase of infection. Negative results do not preclude SARS-CoV-2 infection, do not rule out co-infections with other pathogens, and should not be used as the sole basis for treatment or other patient management decisions. Negative results must be combined with clinical observations, patient history, and epidemiological information. The expected result is Negative. Fact Sheet for Patients: SugarRoll.be Fact Sheet for Healthcare Providers: https://www.woods-mathews.com/ This test is not yet approved or cleared by the [place] FDA and  has been authorized for detection and/or diagnosis of SARS-CoV-2 by FDA under an Emergency Use Authorization (EUA). This EUA will remain  in effect (meaning this test can be used) for the duration of the COVID-19 declaration under Section 56 4(b)(1) of the Act, 21 U.S.C. section 360bbb-3(b)(1), unless the authorization is terminated or revoked sooner. Performed at [facility], [place] [address], [place], [place] [address]      Time coordinating discharge: 88mins  SIGNED:   Kathie Dike, MD  [facility] 11/02/2019, 11:41 AM   If 7PM-7AM, please contact  night-coverage [email or web address]

## 2019-11-02 NOTE — Care Management Obs Status (Signed)
[place] NOTIFICATION   Patient Details  Name: David [name] MRN: [id number] Date of Birth: 10-Jun-1941   Medicare Observation Status Notification Given:  Yes    Claudie Leach, RN 11/02/2019, 12:43 PM

## 2019-11-02 NOTE — Progress Notes (Addendum)
[facility] Progress Note   Subjective:   Patient seen in room. Feeling better today. Unable to reach full UF goal yesterday due to hypotension. Denies SOB, orthopnea, CP, palpitations, abdominal pain, N/V/D. Open to going back to HD 4 times per week due to recurrent admissions for edema.   Objective Vitals:   11/02/19 0014 11/02/19 0456 11/02/19 0553 11/02/19 0823  BP: 114/78 101/66  130/80  Pulse: 72 73  75  Resp: (!) 29 19  19   Temp: (!) 97.5 F (36.4 C) 97.6 F (36.4 C)  97.7 F (36.5 C)  TempSrc: Tympanic Oral  Oral  SpO2: 97% 97%  97%  Weight:   83 kg   Height:       Physical Exam General: Well developed, well nourished male. Alert and in NAD Heart: RRR, no murmurs, rubs or gallops Lungs: CTA bilaterally without wheezing, rhonchi or rales Abdomen: Soft, non-tender, non-distended. +BS Extremities: No edema b/l lower extremities Dialysis Access: LUE AVF + bruit  Additional Objective Labs: Basic Metabolic Panel: Recent Labs  Lab 11/01/19 0059 11/01/19 0128 11/01/19 0739 11/02/19 0259  NA 143 141  --  141  K 5.7* 5.3* 5.9* 4.7  CL 103  --   --  99  CO2 22  --   --  26  GLUCOSE 125*  --   --  81  BUN 56*  --   --  31*  CREATININE 11.92*  --   --  7.89*  CALCIUM 10.1  --   --  8.9   CBC: Recent Labs  Lab 11/01/19 0059 11/01/19 0128  WBC 10.0  --   NEUTROABS 7.1  --   HGB 11.8* 12.6*  HCT 36.0* 37.0*  MCV 111.1*  --   PLT 192  --    Blood Culture    Component Value Date/Time   SDES BLOOD LEFT ANTECUBITAL 03/25/2019 0215   SPECREQUEST  03/25/2019 0215    BOTTLES DRAWN AEROBIC AND ANAEROBIC Blood Culture adequate volume   CULT ACTINOMYCES NAESLUNDII (A) 03/25/2019 0215   REPTSTATUS 03/29/2019 FINAL 03/25/2019 0215    Studies/Results: Dg Chest Port 1 View  Result Date: 11/01/2019 CLINICAL DATA:  Shortness of breath EXAM: PORTABLE CHEST 1 VIEW COMPARISON:  Radiograph 10/21/2019 FINDINGS: Dual lumen dialysis catheter tip terminates at  the superior cavoatrial junction. There is diffuse hazy interstitial opacities throughout the lungs with cephalized, indistinct pulmonary vascularity as well as fissural and septal thickening, increased from comparison exam. Cardiomegaly is similar to prior. No pneumothorax or visible effusion the portion of the right costophrenic sulcus is collimated from view. Postsurgical changes are noted in the left axilla. No acute osseous or soft tissue abnormality. IMPRESSION: 1. Findings consistent with worsening CHF/volume overload. 2. Stable cardiomegaly. 3. Dual lumen dialysis catheter tip terminates at the superior cavoatrial junction. No pneumothorax or visible pleural effusion. Electronically Signed   By: Lovena Le M.D.   On: 11/01/2019 01:17   Medications: . sodium chloride    . nitroGLYCERIN Stopped (11/01/19 0211)   . amiodarone  200 mg Oral Daily  . bictegravir-emtricitabine-tenofovir AF  1 tablet Oral Daily  . Chlorhexidine Gluconate Cloth  6 each Topical Q0600  . [START ON 11/04/2019] midodrine  10 mg Oral Q T,Th,Sa-HD  . sodium chloride flush  3 mL Intravenous Q12H  . sodium chloride flush  3 mL Intravenous Q12H  . sucroferric oxyhydroxide  500 mg Oral TID WC  . [START ON 11/03/2019] warfarin  2.5 mg Oral Once per day  on Mon Fri  . warfarin  5 mg Oral Once per day on Sun Tue Wed Thu Sat  . Warfarin - Pharmacist Dosing Inpatient   Does not apply q1800    Dialysis Orders: Center: [facility]  on TTS. 180NRe, Time: 4 hours, BFR 400, BFR Auto 1.5, EDW 83kg, 3K/ 2Ca, UF Profile 4 No heparin Calcitriol 0.5 mcg PO q HD Midodrine 10mg  PO, 1 tab before HD and 1 tab after  Assessment/Plan: 1. Pulmonary edema: Presented with SOB, CXR consistent with worsening CHF/ volume overload. HD off schedule this week due to holiday so last HD was 11/25 prior to admission. EDW also recently increased due to cramping. Dialyzed yesterday and now at EDW with no SOB. Patient is very sensitive  to excess fluid and previously dialyzed 4 times per week. If patient is unable to tolerate UF, may need to consider going back to 4 times per week. Outpatient HD center is closed today, but we will follow up with them tomorrow and consult with patient's OP nephrologist (Dr. Justin Mend) about this possibility.  2.  ESRD:  TTS- will discuss adding an extra treatment as above. Hyperkalemia resolved after HD.  3.  Hypertension: BP controlled, tends to drop with dialysis. Continue midodrine.  4.  Anemia: Hgb 12.6. No ESA indicated.  5.  Metabolic bone disease: Calcium 8.9. Calcitriol held yesterday due to hypercalcemia, resume at outpatient unit. Not on a binder.  6.  Nutrition:  Renal diet with fluid restrictions.   Anice Paganini, PA-C 11/02/2019, 1:21 PM  [facility] Pager: [phone]  Pt seen, examined and agree w A/P as above. Going home today.  Kelly Splinter  MD 11/02/2019, 2:02 PM

## 2019-11-02 NOTE — Care Management CC44 (Signed)
Condition Code 44 Documentation Completed  Patient Details  Name: David [name] MRN: [id number] Date of Birth: January 13, 1941   Condition Code 44 given:  Yes Patient signature on Condition Code 44 notice:  Yes Documentation of 2 MD's agreement:  Yes Code 44 added to claim:  Yes    Claudie Leach, RN 11/02/2019, 12:43 PM

## 2019-11-03 ENCOUNTER — Other Ambulatory Visit: Payer: Self-pay

## 2019-11-03 ENCOUNTER — Telehealth: Payer: Self-pay | Admitting: Nephrology

## 2019-11-03 DIAGNOSIS — B20 Human immunodeficiency virus [HIV] disease: Secondary | ICD-10-CM | POA: Diagnosis not present

## 2019-11-03 DIAGNOSIS — Z992 Dependence on renal dialysis: Secondary | ICD-10-CM | POA: Diagnosis not present

## 2019-11-03 DIAGNOSIS — N186 End stage renal disease: Secondary | ICD-10-CM | POA: Diagnosis not present

## 2019-11-03 NOTE — Patient Outreach (Addendum)
[facility]) Care Management  11/03/2019  [place] January 20, 1941 [id number]   Telephone Assessment   Voicemail message received form patient. Return call placed to patient. Spoke with patient who denies any acute issues or concerns at present. Marland Kitchen He shares that he was admitted to the hospital due to "fluid backing up in his lungs" he reports he is feeling better but not 100%. He was discharged on 11/02/2019 after an overnight stay(PCP office does TOC). He states he does not feel like all the fluid was removed. He denies any SOB, edema or abnormal wgt gain. He voices that he is adhering to diet and fluid restrictions. He goes to dialysis treatment on tomorrow. He is to follow up with Dr. Oneida Alar on 11/10/2019. He has not called and made PCP appt yet but ovies he will do so today. No issus with transportation. Patient has all his meds and understands how and when to take them.    [facility]N CM Care Plan Problem One     Most Recent Value  Care Plan Problem One  Knowledge deficit related to ESRD diasease process and mgmt.  Role Documenting the Problem One  Care Management Telephonic Coordinator  Care Plan for Problem One  Active  [facility] Long Term Goal   Patient will report 100% adherence to dialysis schedule and treatments over the next 90 days.   [facility] Long Term Goal Start Date  09/10/19  Interventions for Problem One Long Term Goal  RN CM assessed for any barriers to dialysis appts. RN CM reviewed with pt. importance of adherence to schedule and s/s to monitor for.   [facility] CM Short Term Goal #1   Patient will be able to verbalize at least 3-4 healthy diet foods/meals over the next 30days.  [facility]N CM Short Term Goal #1 Start Date  09/10/19  [facility] CM Short Term Goal #1 Met Date  11/03/19  [facility] CM Short Term Goal #2   Patient will be able to verbalize at least two ways to manage fluid retention over the next 30 days.  [facility]N CM Short Term Goal #2 Start Date  09/10/19  [facility] CM Short Term Goal #2  Met Date  11/03/19    [facility] CM Care Plan Problem Two     Most Recent Value  Care Plan Problem Two  Patient needs permanent dialysis access for txs.  Role Documenting the Problem Two  Care Management Telephonic [place] for Problem Two  Active  Interventions for Problem Two Long Term Goal   RN CM confirmed with pt that appt is for 11/10/2019  [facility] Long Term Goal  Patient will report having permanent dialysis access placed within the next 90 days.  THN Long Term Goal Start Date  10/01/19  [facility] CM Short Term Goal #1   Patient will complete vein mapping and pre-surgical workup for dialysis access over the next 30days.  [facility]N CM Short Term Goal #1 Start Date  10/01/19  [facility] CM Short Term Goal #1 Met Date   11/03/19       Plan: RN CM discussed with patient next outreach within the month of Decemmber. Patient gave verbal consent and in agreement with RN CM follow up timeframe. Patient aware that they may contact RN CM sooner for any issues or concerns.   Enzo Montgomery, RN,BSN,CCM [place] Management Telephonic Care Management Coordinator Direct Phone: [phone] Toll Free: [phone] Fax: [phone]

## 2019-11-03 NOTE — Patient Outreach (Signed)
[facility]) Care Management  11/03/2019  [facility] [place] 1941/03/28 [id number]     Transition of Care Referral  Referral Date: 11/03/2019 Referral Source: [employer] Discharge Report Date of Admission: 11/01/2019 Diagnosis: "acute respiratory failure Date of Discharge: 11/02/2019 [facility]    Outreach attempt # 1 to patient. No answer. RN CM left HIPAA compliant voicemail message along with contact info.    Plan: RN CM will make outreach attempt to patient within 3-4 business days.   Enzo Montgomery, RN,BSN,CCM [place] Management Telephonic Care Management Coordinator Direct Phone: [phone] Toll Free: [phone] Fax: [phone]

## 2019-11-03 NOTE — Telephone Encounter (Signed)
Transition of care contact from inpatient facility  Date of discharge: 11/02/19 Date of contact: attempt 11/30 Method: Phone  Attempted to contact patient to discuss transition of care from recent inpatient admission. Pt did not answer the phone - there was no option to leave a message. He is due for dialysis tomorrow and will attempt to see or speak to him at that time.  Veneta Penton, PA-C [employer] Pager [phone]

## 2019-11-04 DIAGNOSIS — E8770 Fluid overload, unspecified: Secondary | ICD-10-CM | POA: Diagnosis not present

## 2019-11-04 DIAGNOSIS — J9601 Acute respiratory failure with hypoxia: Secondary | ICD-10-CM | POA: Diagnosis not present

## 2019-11-04 DIAGNOSIS — N186 End stage renal disease: Secondary | ICD-10-CM | POA: Diagnosis not present

## 2019-11-04 DIAGNOSIS — N2581 Secondary hyperparathyroidism of renal origin: Secondary | ICD-10-CM | POA: Diagnosis not present

## 2019-11-04 DIAGNOSIS — Z992 Dependence on renal dialysis: Secondary | ICD-10-CM | POA: Diagnosis not present

## 2019-11-06 ENCOUNTER — Other Ambulatory Visit (HOSPITAL_COMMUNITY)
Admission: RE | Admit: 2019-11-06 | Discharge: 2019-11-06 | Disposition: A | Discharge status: Home / Self Care | Payer: Medicare HMO | Source: Ambulatory Visit | Attending: Vascular Surgery | Admitting: Vascular Surgery

## 2019-11-06 DIAGNOSIS — Z992 Dependence on renal dialysis: Secondary | ICD-10-CM | POA: Diagnosis not present

## 2019-11-06 DIAGNOSIS — N186 End stage renal disease: Secondary | ICD-10-CM | POA: Diagnosis not present

## 2019-11-06 DIAGNOSIS — Z01812 Encounter for preprocedural laboratory examination: Secondary | ICD-10-CM | POA: Insufficient documentation

## 2019-11-06 DIAGNOSIS — Z20828 Contact with and (suspected) exposure to other viral communicable diseases: Secondary | ICD-10-CM | POA: Diagnosis not present

## 2019-11-06 DIAGNOSIS — N2581 Secondary hyperparathyroidism of renal origin: Secondary | ICD-10-CM | POA: Diagnosis not present

## 2019-11-06 DIAGNOSIS — E8770 Fluid overload, unspecified: Secondary | ICD-10-CM | POA: Diagnosis not present

## 2019-11-06 LAB — PROTIME-INR

## 2019-11-07 ENCOUNTER — Other Ambulatory Visit: Payer: Self-pay

## 2019-11-07 ENCOUNTER — Encounter (HOSPITAL_COMMUNITY): Payer: Self-pay | Admitting: *Deleted

## 2019-11-07 NOTE — Progress Notes (Signed)
Spoke with pt for pre-op call. Pt has hx of A-fib and is on Coumadin, last dose was 11/05/19 per instructions of Dr. Oneida Alar. Pt's cardiologist is [name]. Pt denies any recent chest pain or sob. Pt states he is not diabetic.  Pt had is Covid test yesterday, result is pending. Pt states he's been in quarantine since having the test. Pt will go to dialysis tomorrow and he understands that he needs to wear his mask and social distance as best as he can.

## 2019-11-08 DIAGNOSIS — Z992 Dependence on renal dialysis: Secondary | ICD-10-CM | POA: Diagnosis not present

## 2019-11-08 DIAGNOSIS — E8770 Fluid overload, unspecified: Secondary | ICD-10-CM | POA: Diagnosis not present

## 2019-11-08 DIAGNOSIS — N186 End stage renal disease: Secondary | ICD-10-CM | POA: Diagnosis not present

## 2019-11-08 DIAGNOSIS — N2581 Secondary hyperparathyroidism of renal origin: Secondary | ICD-10-CM | POA: Diagnosis not present

## 2019-11-09 LAB — NOVEL CORONAVIRUS, NAA (HOSP ORDER, SEND-OUT TO REF LAB; TAT 18-24 HRS): SARS-CoV-2, NAA: NOT DETECTED

## 2019-11-10 ENCOUNTER — Ambulatory Visit (HOSPITAL_COMMUNITY)
Admission: RE | Admit: 2019-11-10 | Discharge: 2019-11-10 | Disposition: A | Discharge status: Home / Self Care | Payer: Medicare HMO | Attending: Vascular Surgery | Admitting: Vascular Surgery

## 2019-11-10 ENCOUNTER — Other Ambulatory Visit: Payer: Self-pay

## 2019-11-10 ENCOUNTER — Ambulatory Visit (HOSPITAL_COMMUNITY): Payer: Medicare HMO | Admitting: Anesthesiology

## 2019-11-10 ENCOUNTER — Encounter (HOSPITAL_COMMUNITY)
Admission: RE | Disposition: A | Discharge status: Home / Self Care | Payer: Self-pay | Source: Home / Self Care | Attending: Vascular Surgery

## 2019-11-10 ENCOUNTER — Encounter (HOSPITAL_COMMUNITY): Payer: Self-pay | Admitting: *Deleted

## 2019-11-10 DIAGNOSIS — Z21 Asymptomatic human immunodeficiency virus [HIV] infection status: Secondary | ICD-10-CM | POA: Insufficient documentation

## 2019-11-10 DIAGNOSIS — I480 Paroxysmal atrial fibrillation: Secondary | ICD-10-CM | POA: Diagnosis not present

## 2019-11-10 DIAGNOSIS — Z87891 Personal history of nicotine dependence: Secondary | ICD-10-CM | POA: Diagnosis not present

## 2019-11-10 DIAGNOSIS — M109 Gout, unspecified: Secondary | ICD-10-CM | POA: Diagnosis not present

## 2019-11-10 DIAGNOSIS — N2581 Secondary hyperparathyroidism of renal origin: Secondary | ICD-10-CM | POA: Diagnosis not present

## 2019-11-10 DIAGNOSIS — Z8673 Personal history of transient ischemic attack (TIA), and cerebral infarction without residual deficits: Secondary | ICD-10-CM | POA: Diagnosis not present

## 2019-11-10 DIAGNOSIS — Z7901 Long term (current) use of anticoagulants: Secondary | ICD-10-CM | POA: Diagnosis not present

## 2019-11-10 DIAGNOSIS — N186 End stage renal disease: Secondary | ICD-10-CM | POA: Insufficient documentation

## 2019-11-10 DIAGNOSIS — Z20828 Contact with and (suspected) exposure to other viral communicable diseases: Secondary | ICD-10-CM | POA: Diagnosis not present

## 2019-11-10 DIAGNOSIS — I5042 Chronic combined systolic (congestive) and diastolic (congestive) heart failure: Secondary | ICD-10-CM | POA: Diagnosis not present

## 2019-11-10 DIAGNOSIS — Z8674 Personal history of sudden cardiac arrest: Secondary | ICD-10-CM | POA: Insufficient documentation

## 2019-11-10 DIAGNOSIS — D649 Anemia, unspecified: Secondary | ICD-10-CM | POA: Diagnosis not present

## 2019-11-10 DIAGNOSIS — J449 Chronic obstructive pulmonary disease, unspecified: Secondary | ICD-10-CM | POA: Diagnosis not present

## 2019-11-10 DIAGNOSIS — Z8546 Personal history of malignant neoplasm of prostate: Secondary | ICD-10-CM | POA: Insufficient documentation

## 2019-11-10 DIAGNOSIS — Z992 Dependence on renal dialysis: Secondary | ICD-10-CM | POA: Diagnosis not present

## 2019-11-10 DIAGNOSIS — G4730 Sleep apnea, unspecified: Secondary | ICD-10-CM | POA: Diagnosis not present

## 2019-11-10 DIAGNOSIS — I132 Hypertensive heart and chronic kidney disease with heart failure and with stage 5 chronic kidney disease, or end stage renal disease: Secondary | ICD-10-CM | POA: Diagnosis not present

## 2019-11-10 DIAGNOSIS — Z79899 Other long term (current) drug therapy: Secondary | ICD-10-CM | POA: Diagnosis not present

## 2019-11-10 DIAGNOSIS — I420 Dilated cardiomyopathy: Secondary | ICD-10-CM | POA: Diagnosis not present

## 2019-11-10 HISTORY — PX: AV FISTULA PLACEMENT: SHX1204

## 2019-11-10 LAB — POCT I-STAT, CHEM 8
BUN: 45 mg/dL — ABNORMAL HIGH (ref 8–23)
Calcium, Ion: 1.06 mmol/L — ABNORMAL LOW (ref 1.15–1.40)
Chloride: 99 mmol/L (ref 98–111)
Creatinine, Ser: 10.7 mg/dL — ABNORMAL HIGH (ref 0.61–1.24)
Glucose, Bld: 95 mg/dL (ref 70–99)
HCT: 40 % (ref 39.0–52.0)
Hemoglobin: 13.6 g/dL (ref 13.0–17.0)
Potassium: 5.1 mmol/L (ref 3.5–5.1)
Sodium: 137 mmol/L (ref 135–145)
TCO2: 29 mmol/L (ref 22–32)

## 2019-11-10 LAB — PROTIME-INR
INR: 1.2 (ref 0.8–1.2)
Prothrombin Time: 15.2 seconds (ref 11.4–15.2)

## 2019-11-10 SURGERY — INSERTION OF ARTERIOVENOUS (AV) GORE-TEX GRAFT THIGH
Anesthesia: General | Site: Thigh | Laterality: Left

## 2019-11-10 MED ORDER — 0.9 % SODIUM CHLORIDE (POUR BTL) OPTIME
TOPICAL | Status: DC | PRN
  Administered 2019-11-10: 1000 mL

## 2019-11-10 MED ORDER — ONDANSETRON HCL 4 MG/2ML IJ SOLN: 4.0000 mg | Freq: Once | INTRAMUSCULAR | Status: DC | PRN

## 2019-11-10 MED ORDER — OXYCODONE HCL 5 MG PO TABS
5.0000 mg | ORAL_TABLET | Freq: Once | ORAL | Status: AC | PRN
  Administered 2019-11-10: 5 mg via ORAL

## 2019-11-10 MED ORDER — FENTANYL CITRATE (PF) 100 MCG/2ML IJ SOLN
INTRAMUSCULAR | Status: DC | PRN
  Administered 2019-11-10 (×4): 25 ug via INTRAVENOUS

## 2019-11-10 MED ORDER — ONDANSETRON HCL 4 MG/2ML IJ SOLN
INTRAMUSCULAR | Status: DC | PRN
  Administered 2019-11-10: 4 mg via INTRAVENOUS

## 2019-11-10 MED ORDER — SODIUM CHLORIDE 0.9 % IV SOLN
INTRAVENOUS | Status: DC | PRN
  Administered 2019-11-10: 500 mL

## 2019-11-10 MED ORDER — CEFAZOLIN SODIUM-DEXTROSE 2-4 GM/100ML-% IV SOLN
2.0000 g | INTRAVENOUS | Status: AC
  Administered 2019-11-10: 2 g via INTRAVENOUS

## 2019-11-10 MED ORDER — FENTANYL CITRATE (PF) 250 MCG/5ML IJ SOLN
INTRAMUSCULAR | Status: AC
  Filled 2019-11-10: qty 5

## 2019-11-10 MED ORDER — PROPOFOL 10 MG/ML IV BOLUS
INTRAVENOUS | Status: DC | PRN
  Administered 2019-11-10: 20 mg via INTRAVENOUS
  Administered 2019-11-10: 50 mg via INTRAVENOUS

## 2019-11-10 MED ORDER — PROPOFOL 10 MG/ML IV BOLUS
INTRAVENOUS | Status: AC
  Filled 2019-11-10: qty 20

## 2019-11-10 MED ORDER — FENTANYL CITRATE (PF) 100 MCG/2ML IJ SOLN
INTRAMUSCULAR | Status: AC
  Filled 2019-11-10: qty 2

## 2019-11-10 MED ORDER — FENTANYL CITRATE (PF) 100 MCG/2ML IJ SOLN
25.0000 ug | INTRAMUSCULAR | Status: DC | PRN
  Administered 2019-11-10: 25 ug via INTRAVENOUS

## 2019-11-10 MED ORDER — PHENYLEPHRINE HCL (PRESSORS) 10 MG/ML IV SOLN
INTRAVENOUS | Status: DC | PRN
  Administered 2019-11-10: 40 ug via INTRAVENOUS
  Administered 2019-11-10: 80 ug via INTRAVENOUS

## 2019-11-10 MED ORDER — SODIUM CHLORIDE 0.9 % IV SOLN
INTRAVENOUS | Status: DC | PRN
  Administered 2019-11-10 (×2): via INTRAVENOUS

## 2019-11-10 MED ORDER — OXYCODONE HCL 5 MG/5ML PO SOLN: 5.0000 mg | Freq: Once | ORAL | Status: AC | PRN

## 2019-11-10 MED ORDER — OXYCODONE HCL 5 MG PO TABS
ORAL_TABLET | ORAL | Status: AC
  Filled 2019-11-10: qty 1

## 2019-11-10 MED ORDER — LIDOCAINE 2% (20 MG/ML) 5 ML SYRINGE
INTRAMUSCULAR | Status: DC | PRN
  Administered 2019-11-10: 60 mg via INTRAVENOUS

## 2019-11-10 MED ORDER — HEPARIN SODIUM (PORCINE) 1000 UNIT/ML IJ SOLN
INTRAMUSCULAR | Status: DC | PRN
  Administered 2019-11-10: 8000 [IU] via INTRAVENOUS

## 2019-11-10 MED ORDER — SODIUM CHLORIDE 0.9 % IV SOLN
INTRAVENOUS | Status: DC
  Administered 2019-11-10: 09:00:00 via INTRAVENOUS

## 2019-11-10 MED ORDER — MIDAZOLAM HCL 2 MG/2ML IJ SOLN
INTRAMUSCULAR | Status: AC
  Filled 2019-11-10: qty 2

## 2019-11-10 MED ORDER — PROTAMINE SULFATE 10 MG/ML IV SOLN
INTRAVENOUS | Status: DC | PRN
  Administered 2019-11-10 (×4): 20 mg via INTRAVENOUS

## 2019-11-10 MED ORDER — PHENYLEPHRINE HCL-NACL 10-0.9 MG/250ML-% IV SOLN
INTRAVENOUS | Status: DC | PRN
  Administered 2019-11-10: 50 ug/min via INTRAVENOUS

## 2019-11-10 SURGICAL SUPPLY — 42 items
ADH SKN CLS APL DERMABOND .7 (GAUZE/BANDAGES/DRESSINGS) ×1
AGENT HMST SPONGE THK3/8 (HEMOSTASIS)
CANISTER SUCT 3000ML PPV (MISCELLANEOUS) ×2 IMPLANT
CANNULA VESSEL 3MM 2 BLNT TIP (CANNULA) ×2 IMPLANT
CLIP VESOCCLUDE MED 24/CT (CLIP) ×1 IMPLANT
CLIP VESOCCLUDE MED 6/CT (CLIP) ×1 IMPLANT
CLIP VESOCCLUDE SM WIDE 24/CT (CLIP) ×1 IMPLANT
CLIP VESOCCLUDE SM WIDE 6/CT (CLIP) ×1 IMPLANT
COVER WAND RF STERILE (DRAPES) ×2 IMPLANT
DERMABOND ADVANCED (GAUZE/BANDAGES/DRESSINGS) ×1
DERMABOND ADVANCED .7 DNX12 (GAUZE/BANDAGES/DRESSINGS) ×1 IMPLANT
ELECT CAUTERY BLADE 6.4 (BLADE) ×1 IMPLANT
ELECT REM PT RETURN 9FT ADLT (ELECTROSURGICAL) ×2
ELECTRODE REM PT RTRN 9FT ADLT (ELECTROSURGICAL) ×1 IMPLANT
GLOVE BIO SURGEON STRL SZ7.5 (GLOVE) ×2 IMPLANT
GOWN STRL REUS W/ TWL LRG LVL3 (GOWN DISPOSABLE) ×3 IMPLANT
GOWN STRL REUS W/TWL LRG LVL3 (GOWN DISPOSABLE) ×6
GRAFT GORETEX STRT 4-7X45 (Vascular Products) ×1 IMPLANT
HEMOSTAT SPONGE AVITENE ULTRA (HEMOSTASIS) IMPLANT
KIT BASIN OR (CUSTOM PROCEDURE TRAY) ×2 IMPLANT
KIT TURNOVER KIT B (KITS) ×2 IMPLANT
LOOP VESSEL MAXI BLUE (MISCELLANEOUS) ×2 IMPLANT
LOOP VESSEL MINI RED (MISCELLANEOUS) ×2 IMPLANT
NS IRRIG 1000ML POUR BTL (IV SOLUTION) ×2 IMPLANT
PACK ENDOVASCULAR (PACKS) ×1 IMPLANT
PAD ARMBOARD 7.5X6 YLW CONV (MISCELLANEOUS) ×4 IMPLANT
PENCIL BUTTON HOLSTER BLD 10FT (ELECTRODE) ×1 IMPLANT
SUT PROLENE 6 0 CC (SUTURE) ×4 IMPLANT
SUT SILK 2 0 (SUTURE) ×2
SUT SILK 2-0 18XBRD TIE 12 (SUTURE) IMPLANT
SUT SILK 3 0 (SUTURE) ×4
SUT SILK 3-0 18XBRD TIE 12 (SUTURE) IMPLANT
SUT SILK 4 0 (SUTURE) ×2
SUT SILK 4-0 18XBRD TIE 12 (SUTURE) IMPLANT
SUT VIC AB 2-0 SH 27 (SUTURE) ×2
SUT VIC AB 2-0 SH 27XBRD (SUTURE) ×1 IMPLANT
SUT VIC AB 3-0 SH 27 (SUTURE) ×2
SUT VIC AB 3-0 SH 27X BRD (SUTURE) ×2 IMPLANT
SUT VICRYL 4-0 PS2 18IN ABS (SUTURE) ×4 IMPLANT
TOWEL GREEN STERILE (TOWEL DISPOSABLE) ×2 IMPLANT
UNDERPAD 30X30 (UNDERPADS AND DIAPERS) ×2 IMPLANT
WATER STERILE IRR 1000ML POUR (IV SOLUTION) ×2 IMPLANT

## 2019-11-10 NOTE — Anesthesia Preprocedure Evaluation (Addendum)
Anesthesia Evaluation    Reviewed: Allergy & Precautions, Patient's Chart, lab work & pertinent test results  History of Anesthesia Complications Negative for: history of anesthetic complications  Airway Mallampati: II  TM Distance: >3 FB Neck ROM: Full    Dental  (+) Dental Advisory Given, Teeth Intact   Pulmonary sleep apnea , COPD, former smoker,    Pulmonary exam normal        Cardiovascular hypertension, Pt. on medications + CAD and +CHF  Normal cardiovascular exam+ dysrhythmias Atrial Fibrillation    '20 TTE - EF 20-25%. LV cavity size was moderately dilated. Left ventricular diffuse hypokinesis. Mild AI. Trivial MR and TR.     Neuro/Psych TIAnegative psych ROS   GI/Hepatic PUD, GERD  Medicated and Controlled,(+) Hepatitis -  Endo/Other  negative endocrine ROS  Renal/GU ESRF and DialysisRenal disease     Musculoskeletal  (+) Arthritis ,  Gout   Abdominal   Peds  Hematology  (+) anemia , HIV,   Anesthesia Other Findings Covid negative 12/3   Reproductive/Obstetrics                             Anesthesia Physical Anesthesia Plan  ASA: IV  Anesthesia Plan: General   Post-op Pain Management:    Induction: Intravenous  PONV Risk Score and Plan: 2 and Treatment may vary due to age or medical condition and Ondansetron  Airway Management Planned: LMA  Additional Equipment: None  Intra-op Plan:   Post-operative Plan: Extubation in OR  Informed Consent: I have reviewed the patients History and Physical, chart, labs and discussed the procedure including the risks, benefits and alternatives for the proposed anesthesia with the patient or authorized representative who has indicated his/her understanding and acceptance.     Dental advisory given  Plan Discussed with: CRNA and Anesthesiologist  Anesthesia Plan Comments:        Anesthesia Quick Evaluation

## 2019-11-10 NOTE — Transfer of Care (Signed)
Immediate Anesthesia Transfer of Care Note  Patient: [facility]  Procedure(s) Performed: INSERTION OF ARTERIOVENOUS (AV) GORE-TEX GRAFT THIGH (Left Thigh)  Patient Location: PACU  Anesthesia Type:General  Level of Consciousness: awake, alert  and oriented  Airway & Oxygen Therapy: Patient Spontanous Breathing and Patient connected to nasal cannula oxygen  Post-op Assessment: Report given to RN, Post -op Vital signs reviewed and stable and Patient moving all extremities X 4  Post vital signs: Reviewed and stable  Last Vitals:  Vitals Value Taken Time  BP 162/85 11/10/19 1133  Temp    Pulse    Resp 19 11/10/19 1134  SpO2    Vitals shown include unvalidated device data.  Last Pain:  Vitals:   11/10/19 0850  TempSrc:   PainSc: 0-No pain      Patients Stated Pain Goal: 3 (44/69/50 7225)  Complications: No apparent anesthesia complications

## 2019-11-10 NOTE — Interval H&P Note (Signed)
History and Physical Interval Note:  11/10/2019 8:25 AM  [facility] [place]  has presented today for surgery, with the diagnosis of end stage renal disease.  The various methods of treatment have been discussed with the patient and family. After consideration of risks, benefits and other options for treatment, the patient has consented to  Procedure(s): INSERTION OF ARTERIOVENOUS (AV) GORE-TEX GRAFT THIGH (Left) as a surgical intervention.  The patient's history has been reviewed, patient examined, no change in status, stable for surgery.  I have reviewed the patient's chart and labs.  Questions were answered to the patient's satisfaction.     Ruta Hinds

## 2019-11-10 NOTE — Op Note (Signed)
Procedure: Left Thigh AV graft  Preoperative diagnosis: End-stage renal disease  Postoperative diagnosis: Same  Anesthesia: Gen.  Assistant: Waldron Labs RNFA  Operative findings: 4-7 mm PTFE graft to superficial femoral artery end to side and saphenofemoral junction end to side  Operative details: After obtaining informed consent, the patient was taken to the operating room. The patient was placed in supine position operating table. After induction of general anesthesia and endotracheal intubation the patient's entire left anterior thigh and groin were prepped and draped in usual sterile fashion. Next an oblique incision was made in the left groin crease and carried down through the subcutaneous tissues to the level of the saphenous femoral junction. Greater saphenous vein was dissected free for several centimeters and several small side branches ligated and divided between silk ties. The anterior surface of the left common femoral vein was also dissected free in preparation for clamping. Next, the left common femoral artery was dissected free circumferentially. The superficial femoral artery and profunda were dissected free circumferentially for several centimeters after it's origin. Vessel loops were placed proximal and distal the planned site of arteriotomy. Next a subcutaneous tunnel was created in a loop configuration on the anterior aspect of the left thigh. The lateral aspect of the graft was the arterial limb the medial aspect was the venous limb. A transverse incision was made in the distal thigh for assistance in tunneling. The patient was then given 8000 units of intravenous heparin. Vessel loops were used to control the superficial femoral artery proximally and distally. The graft was beveled on the arterial end and sewn end of graft to side of artery using running 6-0 Prolene suture. Just prior to completion of the anastomosis it was forebled backbled and thoroughly flushed.   The  anastomosis was secured; vesseloops were released; and there was pulsatile flow in the graft immediately. There was also still pulsatile flow in the superficial femoral artery below this. Attention was then turned to the venous anastomosis. The graft was pulled taut to length. The distal saphenous vein was ligated with a 2 0 silk tie.  The common femoral and was controlled proximally with a fistula clamp. The saphenous vein was transected and opened longitudinally from the proximal sapenous into the common femoral vein.   The graft was cut to length and beveled and sewn end to end to the saphenofemoral junction with the tip of the graft extending out onto the common femoral vein. This was done with a running 6-0 Prolene suture. Just prior completion anastomosis this was forebled backbled and thoroughly flushed.   The anastomosis was secured,  clamps released, and there was a palpable thrill in the vein immediately. Hemostasis was obtained with direct pressure as well as 80 mg protamine. Subcutaneous tissues of both incisions were closed with a running 3-0 Vicryl suture. The skin of both incisions was closed with 4 0 Vicryl subcuticular stitch. Dermabond was applied to both incisions. Patient tolerated the procedure well and there were no complications. Instrument, sponge, and needle counts were correct at the end of the case. The patient was taken to the recovery room in stable condition.  Ruta Hinds, MD [facility] Office: [phone] Pager: [phone]

## 2019-11-10 NOTE — Anesthesia Procedure Notes (Signed)
Procedure Name: LMA Insertion Date/Time: 11/10/2019 9:21 AM Performed by: Neldon Newport, CRNA Pre-anesthesia Checklist: Timeout performed, Patient being monitored, Suction available, Emergency Drugs available and Patient identified Patient Re-evaluated:Patient Re-evaluated prior to induction Oxygen Delivery Method: Circle system utilized Preoxygenation: Pre-oxygenation with 100% oxygen Induction Type: IV induction Ventilation: Mask ventilation without difficulty LMA: LMA inserted LMA Size: 5.0 Tube type: Oral Number of attempts: 1 Placement Confirmation: positive ETCO2 and breath sounds checked- equal and bilateral Tube secured with: Tape Dental Injury: Teeth and Oropharynx as per pre-operative assessment

## 2019-11-10 NOTE — Anesthesia Postprocedure Evaluation (Signed)
Anesthesia Post Note  Patient: [facility]  Procedure(s) Performed: INSERTION OF ARTERIOVENOUS (AV) GORE-TEX GRAFT THIGH (Left Thigh)     Patient location during evaluation: PACU Anesthesia Type: General Level of consciousness: awake and alert Pain management: pain level controlled Vital Signs Assessment: post-procedure vital signs reviewed and stable Respiratory status: spontaneous breathing, nonlabored ventilation and respiratory function stable Cardiovascular status: blood pressure returned to baseline and stable Postop Assessment: no apparent nausea or vomiting Anesthetic complications: no    Last Vitals:  Vitals:   11/10/19 1234 11/10/19 1249  BP: 128/73 128/67  Pulse: 76 74  Resp: 14 15  Temp:  36.5 C  SpO2: 97% 99%    Last Pain:  Vitals:   11/10/19 1204  TempSrc:   PainSc: 3                  Audry Pili

## 2019-11-10 NOTE — Progress Notes (Signed)
Nurse gave 25 mcg of Fentanyl 75 mcg of fentanyl wasted witness By Orie Fisherman

## 2019-11-11 ENCOUNTER — Encounter (HOSPITAL_COMMUNITY): Payer: Self-pay | Admitting: Vascular Surgery

## 2019-11-11 ENCOUNTER — Inpatient Hospital Stay: Payer: Medicare HMO | Admitting: Family

## 2019-11-11 DIAGNOSIS — E8770 Fluid overload, unspecified: Secondary | ICD-10-CM | POA: Diagnosis not present

## 2019-11-11 DIAGNOSIS — Z992 Dependence on renal dialysis: Secondary | ICD-10-CM | POA: Diagnosis not present

## 2019-11-11 DIAGNOSIS — N186 End stage renal disease: Secondary | ICD-10-CM | POA: Diagnosis not present

## 2019-11-11 DIAGNOSIS — N2581 Secondary hyperparathyroidism of renal origin: Secondary | ICD-10-CM | POA: Diagnosis not present

## 2019-11-13 DIAGNOSIS — N2581 Secondary hyperparathyroidism of renal origin: Secondary | ICD-10-CM | POA: Diagnosis not present

## 2019-11-13 DIAGNOSIS — Z992 Dependence on renal dialysis: Secondary | ICD-10-CM | POA: Diagnosis not present

## 2019-11-13 DIAGNOSIS — E8770 Fluid overload, unspecified: Secondary | ICD-10-CM | POA: Diagnosis not present

## 2019-11-13 DIAGNOSIS — N186 End stage renal disease: Secondary | ICD-10-CM | POA: Diagnosis not present

## 2019-11-13 LAB — POCT INR: INR: 1.4 — AB (ref 2.0–3.0)

## 2019-11-15 DIAGNOSIS — Z992 Dependence on renal dialysis: Secondary | ICD-10-CM | POA: Diagnosis not present

## 2019-11-15 DIAGNOSIS — N186 End stage renal disease: Secondary | ICD-10-CM | POA: Diagnosis not present

## 2019-11-15 DIAGNOSIS — E8770 Fluid overload, unspecified: Secondary | ICD-10-CM | POA: Diagnosis not present

## 2019-11-15 DIAGNOSIS — N2581 Secondary hyperparathyroidism of renal origin: Secondary | ICD-10-CM | POA: Diagnosis not present

## 2019-11-17 ENCOUNTER — Ambulatory Visit (HOSPITAL_COMMUNITY): Payer: Medicare HMO | Admitting: Certified Registered Nurse Anesthetist

## 2019-11-17 ENCOUNTER — Encounter (HOSPITAL_COMMUNITY): Payer: Self-pay | Admitting: Vascular Surgery

## 2019-11-17 ENCOUNTER — Ambulatory Visit (HOSPITAL_COMMUNITY)
Admission: RE | Admit: 2019-11-17 | Discharge: 2019-11-17 | Disposition: A | Discharge status: Home / Self Care | Payer: Medicare HMO | Source: Other Acute Inpatient Hospital | Attending: Vascular Surgery | Admitting: Vascular Surgery

## 2019-11-17 ENCOUNTER — Encounter (HOSPITAL_COMMUNITY): Admission: RE | Disposition: A | Discharge status: Home / Self Care | Payer: Self-pay | Attending: Vascular Surgery

## 2019-11-17 ENCOUNTER — Ambulatory Visit (HOSPITAL_COMMUNITY): Payer: Medicare HMO

## 2019-11-17 ENCOUNTER — Other Ambulatory Visit: Payer: Self-pay

## 2019-11-17 ENCOUNTER — Ambulatory Visit (INDEPENDENT_AMBULATORY_CARE_PROVIDER_SITE_OTHER): Payer: Medicare HMO | Admitting: Cardiovascular Disease

## 2019-11-17 ENCOUNTER — Other Ambulatory Visit: Payer: Self-pay | Admitting: *Deleted

## 2019-11-17 DIAGNOSIS — Z20828 Contact with and (suspected) exposure to other viral communicable diseases: Secondary | ICD-10-CM | POA: Insufficient documentation

## 2019-11-17 DIAGNOSIS — K219 Gastro-esophageal reflux disease without esophagitis: Secondary | ICD-10-CM | POA: Diagnosis not present

## 2019-11-17 DIAGNOSIS — I132 Hypertensive heart and chronic kidney disease with heart failure and with stage 5 chronic kidney disease, or end stage renal disease: Secondary | ICD-10-CM | POA: Diagnosis not present

## 2019-11-17 DIAGNOSIS — Z87891 Personal history of nicotine dependence: Secondary | ICD-10-CM | POA: Diagnosis not present

## 2019-11-17 DIAGNOSIS — I5043 Acute on chronic combined systolic (congestive) and diastolic (congestive) heart failure: Secondary | ICD-10-CM | POA: Diagnosis not present

## 2019-11-17 DIAGNOSIS — T82868A Thrombosis of vascular prosthetic devices, implants and grafts, initial encounter: Secondary | ICD-10-CM

## 2019-11-17 DIAGNOSIS — G4730 Sleep apnea, unspecified: Secondary | ICD-10-CM | POA: Insufficient documentation

## 2019-11-17 DIAGNOSIS — B20 Human immunodeficiency virus [HIV] disease: Secondary | ICD-10-CM | POA: Diagnosis not present

## 2019-11-17 DIAGNOSIS — Z992 Dependence on renal dialysis: Secondary | ICD-10-CM | POA: Insufficient documentation

## 2019-11-17 DIAGNOSIS — I4891 Unspecified atrial fibrillation: Secondary | ICD-10-CM | POA: Diagnosis not present

## 2019-11-17 DIAGNOSIS — I5042 Chronic combined systolic (congestive) and diastolic (congestive) heart failure: Secondary | ICD-10-CM | POA: Diagnosis not present

## 2019-11-17 DIAGNOSIS — N186 End stage renal disease: Secondary | ICD-10-CM

## 2019-11-17 DIAGNOSIS — I428 Other cardiomyopathies: Secondary | ICD-10-CM | POA: Insufficient documentation

## 2019-11-17 DIAGNOSIS — Y841 Kidney dialysis as the cause of abnormal reaction of the patient, or of later complication, without mention of misadventure at the time of the procedure: Secondary | ICD-10-CM | POA: Insufficient documentation

## 2019-11-17 DIAGNOSIS — Z79899 Other long term (current) drug therapy: Secondary | ICD-10-CM | POA: Insufficient documentation

## 2019-11-17 DIAGNOSIS — Z515 Encounter for palliative care: Secondary | ICD-10-CM

## 2019-11-17 DIAGNOSIS — E785 Hyperlipidemia, unspecified: Secondary | ICD-10-CM | POA: Insufficient documentation

## 2019-11-17 DIAGNOSIS — M109 Gout, unspecified: Secondary | ICD-10-CM | POA: Diagnosis not present

## 2019-11-17 DIAGNOSIS — Z8673 Personal history of transient ischemic attack (TIA), and cerebral infarction without residual deficits: Secondary | ICD-10-CM | POA: Insufficient documentation

## 2019-11-17 DIAGNOSIS — Z419 Encounter for procedure for purposes other than remedying health state, unspecified: Secondary | ICD-10-CM

## 2019-11-17 DIAGNOSIS — Z8674 Personal history of sudden cardiac arrest: Secondary | ICD-10-CM | POA: Diagnosis not present

## 2019-11-17 DIAGNOSIS — Z7901 Long term (current) use of anticoagulants: Secondary | ICD-10-CM | POA: Diagnosis not present

## 2019-11-17 DIAGNOSIS — I770 Arteriovenous fistula, acquired: Secondary | ICD-10-CM | POA: Diagnosis not present

## 2019-11-17 DIAGNOSIS — E8770 Fluid overload, unspecified: Secondary | ICD-10-CM | POA: Diagnosis not present

## 2019-11-17 DIAGNOSIS — I482 Chronic atrial fibrillation, unspecified: Secondary | ICD-10-CM

## 2019-11-17 DIAGNOSIS — N2581 Secondary hyperparathyroidism of renal origin: Secondary | ICD-10-CM | POA: Diagnosis not present

## 2019-11-17 HISTORY — PX: THROMBECTOMY W/ EMBOLECTOMY: SHX2507

## 2019-11-17 HISTORY — PX: LOWER EXTREMITY ANGIOGRAM: SHX5508

## 2019-11-17 LAB — POCT I-STAT, CHEM 8
BUN: 25 mg/dL — ABNORMAL HIGH (ref 8–23)
Calcium, Ion: 0.98 mmol/L — ABNORMAL LOW (ref 1.15–1.40)
Chloride: 98 mmol/L (ref 98–111)
Creatinine, Ser: 5.6 mg/dL — ABNORMAL HIGH (ref 0.61–1.24)
Glucose, Bld: 88 mg/dL (ref 70–99)
HCT: 38 % — ABNORMAL LOW (ref 39.0–52.0)
Hemoglobin: 12.9 g/dL — ABNORMAL LOW (ref 13.0–17.0)
Potassium: 5 mmol/L (ref 3.5–5.1)
Sodium: 137 mmol/L (ref 135–145)
TCO2: 30 mmol/L (ref 22–32)

## 2019-11-17 LAB — RESPIRATORY PANEL BY RT PCR (FLU A&B, COVID)
Influenza A by PCR: NEGATIVE
Influenza B by PCR: NEGATIVE
SARS Coronavirus 2 by RT PCR: NEGATIVE

## 2019-11-17 LAB — GLUCOSE, CAPILLARY: Glucose-Capillary: 106 mg/dL — ABNORMAL HIGH (ref 70–99)

## 2019-11-17 LAB — SURGICAL PCR SCREEN
MRSA, PCR: NEGATIVE
Staphylococcus aureus: NEGATIVE

## 2019-11-17 LAB — PROTIME-INR
INR: 1.6 — ABNORMAL HIGH (ref 0.8–1.2)
Prothrombin Time: 19.3 seconds — ABNORMAL HIGH (ref 11.4–15.2)

## 2019-11-17 SURGERY — THROMBECTOMY ARTERIOVENOUS FISTULA
Anesthesia: General | Site: Leg Upper | Laterality: Left

## 2019-11-17 MED ORDER — CHLORHEXIDINE GLUCONATE 4 % EX LIQD: 60.0000 mL | Freq: Once | CUTANEOUS | Status: DC

## 2019-11-17 MED ORDER — ETOMIDATE 2 MG/ML IV SOLN
INTRAVENOUS | Status: AC
  Filled 2019-11-17: qty 10

## 2019-11-17 MED ORDER — IODIXANOL 320 MG/ML IV SOLN
INTRAVENOUS | Status: DC | PRN
  Administered 2019-11-17: 30 mL

## 2019-11-17 MED ORDER — MUPIROCIN 2 % EX OINT
TOPICAL_OINTMENT | CUTANEOUS | Status: AC
  Filled 2019-11-17: qty 22

## 2019-11-17 MED ORDER — SODIUM CHLORIDE 0.9 % IV SOLN
INTRAVENOUS | Status: DC | PRN
  Administered 2019-11-17: 500 mL

## 2019-11-17 MED ORDER — CEFAZOLIN SODIUM-DEXTROSE 2-3 GM-%(50ML) IV SOLR
INTRAVENOUS | Status: DC | PRN
  Administered 2019-11-17: 2 g via INTRAVENOUS

## 2019-11-17 MED ORDER — MIDAZOLAM HCL 2 MG/2ML IJ SOLN: 0.5000 mg | Freq: Once | INTRAMUSCULAR | Status: DC | PRN

## 2019-11-17 MED ORDER — 0.9 % SODIUM CHLORIDE (POUR BTL) OPTIME
TOPICAL | Status: DC | PRN
  Administered 2019-11-17: 1000 mL

## 2019-11-17 MED ORDER — TRAMADOL HCL 50 MG PO TABS: 50.0000 mg | ORAL_TABLET | Freq: Four times a day (QID) | ORAL | 0 refills | Status: DC | PRN

## 2019-11-17 MED ORDER — PROMETHAZINE HCL 25 MG/ML IJ SOLN: 6.2500 mg | INTRAMUSCULAR | Status: DC | PRN

## 2019-11-17 MED ORDER — NEOSTIGMINE METHYLSULFATE 10 MG/10ML IV SOLN
INTRAVENOUS | Status: DC | PRN
  Administered 2019-11-17: 3 mg via INTRAVENOUS

## 2019-11-17 MED ORDER — ETOMIDATE 2 MG/ML IV SOLN
INTRAVENOUS | Status: DC | PRN
  Administered 2019-11-17: 20 mg via INTRAVENOUS

## 2019-11-17 MED ORDER — SODIUM CHLORIDE 0.9 % IV SOLN
INTRAVENOUS | Status: DC
  Administered 2019-11-17: 12:00:00 via INTRAVENOUS

## 2019-11-17 MED ORDER — TRAMADOL HCL 50 MG PO TABS
ORAL_TABLET | ORAL | Status: AC
  Filled 2019-11-17: qty 1

## 2019-11-17 MED ORDER — PHENYLEPHRINE HCL-NACL 10-0.9 MG/250ML-% IV SOLN
INTRAVENOUS | Status: DC | PRN
  Administered 2019-11-17: 50 ug/min via INTRAVENOUS

## 2019-11-17 MED ORDER — MUPIROCIN 2 % EX OINT: 1.0000 "application " | TOPICAL_OINTMENT | Freq: Once | CUTANEOUS | Status: DC

## 2019-11-17 MED ORDER — ONDANSETRON HCL 4 MG/2ML IJ SOLN
INTRAMUSCULAR | Status: DC | PRN
  Administered 2019-11-17: 4 mg via INTRAVENOUS

## 2019-11-17 MED ORDER — TRAMADOL HCL 50 MG PO TABS
50.0000 mg | ORAL_TABLET | Freq: Once | ORAL | Status: AC
  Administered 2019-11-17: 18:00:00 50 mg via ORAL

## 2019-11-17 MED ORDER — FENTANYL CITRATE (PF) 100 MCG/2ML IJ SOLN
INTRAMUSCULAR | Status: DC | PRN
  Administered 2019-11-17: 50 ug via INTRAVENOUS

## 2019-11-17 MED ORDER — MEPERIDINE HCL 25 MG/ML IJ SOLN: 6.2500 mg | INTRAMUSCULAR | Status: DC | PRN

## 2019-11-17 MED ORDER — HEPARIN SODIUM (PORCINE) 1000 UNIT/ML IJ SOLN
INTRAMUSCULAR | Status: DC | PRN
  Administered 2019-11-17: 3000 [IU] via INTRAVENOUS

## 2019-11-17 MED ORDER — GLYCOPYRROLATE 0.2 MG/ML IJ SOLN
INTRAMUSCULAR | Status: DC | PRN
  Administered 2019-11-17: .4 mg via INTRAVENOUS

## 2019-11-17 MED ORDER — SODIUM CHLORIDE 0.9 % IV SOLN
INTRAVENOUS | Status: DC | PRN
  Administered 2019-11-17 (×2): via INTRAVENOUS

## 2019-11-17 MED ORDER — CEFAZOLIN SODIUM-DEXTROSE 2-4 GM/100ML-% IV SOLN
2.0000 g | INTRAVENOUS | Status: DC
  Filled 2019-11-17: qty 100

## 2019-11-17 MED ORDER — PHENYLEPHRINE HCL (PRESSORS) 10 MG/ML IV SOLN
INTRAVENOUS | Status: DC | PRN
  Administered 2019-11-17: 120 ug via INTRAVENOUS

## 2019-11-17 MED ORDER — SODIUM CHLORIDE 0.9 % IV SOLN
INTRAVENOUS | Status: AC
  Filled 2019-11-17: qty 1.2

## 2019-11-17 MED ORDER — LIDOCAINE 2% (20 MG/ML) 5 ML SYRINGE
INTRAMUSCULAR | Status: DC | PRN
  Administered 2019-11-17: 20 mg via INTRAVENOUS

## 2019-11-17 MED ORDER — FENTANYL CITRATE (PF) 250 MCG/5ML IJ SOLN
INTRAMUSCULAR | Status: AC
  Filled 2019-11-17: qty 5

## 2019-11-17 MED ORDER — FENTANYL CITRATE (PF) 100 MCG/2ML IJ SOLN: 25.0000 ug | INTRAMUSCULAR | Status: DC | PRN

## 2019-11-17 MED ORDER — ROCURONIUM BROMIDE 50 MG/5ML IV SOSY
PREFILLED_SYRINGE | INTRAVENOUS | Status: DC | PRN
  Administered 2019-11-17: 30 mg via INTRAVENOUS
  Administered 2019-11-17: 20 mg via INTRAVENOUS

## 2019-11-17 SURGICAL SUPPLY — 47 items
ADH SKN CLS APL DERMABOND .7 (GAUZE/BANDAGES/DRESSINGS) ×2
AGENT HMST SPONGE THK3/8 (HEMOSTASIS)
ARMBAND PINK RESTRICT EXTREMIT (MISCELLANEOUS) ×4 IMPLANT
CANISTER SUCT 3000ML PPV (MISCELLANEOUS) ×4 IMPLANT
CANNULA VESSEL 3MM 2 BLNT TIP (CANNULA) IMPLANT
CATH EMB 3FR 80CM (CATHETERS) ×2 IMPLANT
CATH EMB 4FR 80CM (CATHETERS) ×4 IMPLANT
CLIP VESOCCLUDE MED 6/CT (CLIP) ×4 IMPLANT
CLIP VESOCCLUDE SM WIDE 6/CT (CLIP) ×4 IMPLANT
COVER WAND RF STERILE (DRAPES) ×2 IMPLANT
DECANTER SPIKE VIAL GLASS SM (MISCELLANEOUS) ×4 IMPLANT
DERMABOND ADVANCED (GAUZE/BANDAGES/DRESSINGS) ×2
DERMABOND ADVANCED .7 DNX12 (GAUZE/BANDAGES/DRESSINGS) ×2 IMPLANT
DRAPE C-ARM 42X72 X-RAY (DRAPES) ×2 IMPLANT
DRAPE INCISE IOBAN 66X45 STRL (DRAPES) ×2 IMPLANT
DRAPE ORTHO SPLIT 77X108 STRL (DRAPES) ×4
DRAPE SURG ORHT 6 SPLT 77X108 (DRAPES) IMPLANT
DRAPE X-RAY CASS 24X20 (DRAPES) IMPLANT
ELECT REM PT RETURN 9FT ADLT (ELECTROSURGICAL) ×4
ELECTRODE REM PT RTRN 9FT ADLT (ELECTROSURGICAL) ×2 IMPLANT
GLOVE BIO SURGEON STRL SZ7.5 (GLOVE) ×4 IMPLANT
GLOVE BIOGEL PI IND STRL 8 (GLOVE) IMPLANT
GLOVE BIOGEL PI INDICATOR 8 (GLOVE) ×2
GLOVE SURG SS PI 6.5 STRL IVOR (GLOVE) ×2 IMPLANT
GOWN STRL REUS W/ TWL LRG LVL3 (GOWN DISPOSABLE) ×6 IMPLANT
GOWN STRL REUS W/TWL LRG LVL3 (GOWN DISPOSABLE) ×16
HEMOSTAT SPONGE AVITENE ULTRA (HEMOSTASIS) IMPLANT
KIT BASIN OR (CUSTOM PROCEDURE TRAY) ×4 IMPLANT
KIT TURNOVER KIT B (KITS) ×4 IMPLANT
LOOP VESSEL MINI RED (MISCELLANEOUS) IMPLANT
NS IRRIG 1000ML POUR BTL (IV SOLUTION) ×4 IMPLANT
PACK CV ACCESS (CUSTOM PROCEDURE TRAY) ×4 IMPLANT
PAD ARMBOARD 7.5X6 YLW CONV (MISCELLANEOUS) ×8 IMPLANT
SET COLLECT BLD 21X3/4 12 (NEEDLE) IMPLANT
STOPCOCK 4 WAY LG BORE MALE ST (IV SETS) IMPLANT
SUT MNCRL AB 4-0 PS2 18 (SUTURE) ×2 IMPLANT
SUT PROLENE 5 0 C 1 24 (SUTURE) ×2 IMPLANT
SUT PROLENE 6 0 CC (SUTURE) ×8 IMPLANT
SUT VIC AB 3-0 SH 27 (SUTURE) ×8
SUT VIC AB 3-0 SH 27X BRD (SUTURE) ×2 IMPLANT
SUT VICRYL 4-0 PS2 18IN ABS (SUTURE) ×4 IMPLANT
SYR 10ML LL (SYRINGE) ×2 IMPLANT
SYR 3ML LL SCALE MARK (SYRINGE) ×2 IMPLANT
TOWEL GREEN STERILE (TOWEL DISPOSABLE) ×4 IMPLANT
TUBING EXTENTION W/L.L. (IV SETS) ×2 IMPLANT
UNDERPAD 30X30 (UNDERPADS AND DIAPERS) ×4 IMPLANT
WATER STERILE IRR 1000ML POUR (IV SOLUTION) ×4 IMPLANT

## 2019-11-17 NOTE — Anesthesia Preprocedure Evaluation (Addendum)
Anesthesia Evaluation  Patient identified by MRN, date of birth, ID band Patient awake    Reviewed: Allergy & Precautions, NPO status , Patient's Chart, lab work & pertinent test results, reviewed documented beta blocker date and time   History of Anesthesia Complications Negative for: history of anesthetic complications  Airway Mallampati: II  TM Distance: >3 FB Neck ROM: Full    Dental  (+) Dental Advisory Given   Pulmonary sleep apnea (does not use CPAP) , COPD,  COPD inhaler, former smoker,  11/17/2019 SARS CoV2 NEG   breath sounds clear to auscultation       Cardiovascular hypertension, Pt. on medications and Pt. 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 The cavity size was moderately dilated. Left ventricular diffuse hypokinesis. Aortic valve regurgitation is mild.   Neuro/Psych CVA, No Residual Symptoms    GI/Hepatic negative GI ROS, (+) Hepatitis -  Endo/Other  negative endocrine ROS  Renal/GU Dialysis and ESRFRenal disease ([facility])   H/o prostate cancer    Musculoskeletal   Abdominal   Peds  Hematology  (+) HIV, INR 1.6 Coumadin   Anesthesia Other Findings   Reproductive/Obstetrics                            Anesthesia Physical Anesthesia Plan  ASA: III  Anesthesia Plan: General   Post-op Pain Management:    Induction: Intravenous  PONV Risk Score and Plan: 2 and Ondansetron, Dexamethasone and Treatment may vary due to age or medical condition  Airway Management Planned: Oral ETT  Additional Equipment:   Intra-op Plan:   Post-operative Plan: Extubation in OR  Informed Consent: I have reviewed the patients History and Physical, chart, labs and discussed the procedure including the risks, benefits and alternatives for the  proposed anesthesia with the patient or authorized representative who has indicated his/her understanding and acceptance.     Dental advisory given  Plan Discussed with: CRNA and Surgeon  Anesthesia Plan Comments:        Anesthesia Quick Evaluation

## 2019-11-17 NOTE — Anesthesia Postprocedure Evaluation (Signed)
Anesthesia Post Note  Patient: [facility]  Procedure(s) Performed: THROMBECTOMY ARTERIOVENOUS GRAFT LEFT THIGH (Left Groin) Lower Extremity Fistulogram (Left Leg Upper)     Patient location during evaluation: PACU Anesthesia Type: General Level of consciousness: awake and alert, patient cooperative and oriented Pain management: pain level controlled Vital Signs Assessment: post-procedure vital signs reviewed and stable Respiratory status: spontaneous breathing, nonlabored ventilation and respiratory function stable Cardiovascular status: blood pressure returned to baseline and stable Postop Assessment: no apparent nausea or vomiting Anesthetic complications: no    Last Vitals:  Vitals:   11/17/19 1708 11/17/19 1723  BP: (!) 169/69 (!) 157/76  Pulse: 91 84  Resp: 16 18  Temp:  36.7 C  SpO2: 92% 94%    Last Pain:  Vitals:   11/17/19 1723  TempSrc:   PainSc: 0-No pain                 Rudolph Daoust,E. Edlin Ford

## 2019-11-17 NOTE — Anesthesia Procedure Notes (Cosign Needed)
Procedure Name: Intubation Date/Time: 11/17/2019 3:32 PM Performed by: Annye Asa, MD Pre-anesthesia Checklist: Patient identified, Suction available, Emergency Drugs available, Timeout performed and Patient being monitored Patient Re-evaluated:Patient Re-evaluated prior to induction Oxygen Delivery Method: Circle system utilized Preoxygenation: Pre-oxygenation with 100% oxygen Induction Type: IV induction Ventilation: Oral airway inserted - appropriate to patient size Laryngoscope Size: Mac and 4 Grade View: Grade I Tube type: Oral Tube size: 7.5 mm Number of attempts: 1 Airway Equipment and Method: Stylet Placement Confirmation: ETT inserted through vocal cords under direct vision,  positive ETCO2 and breath sounds checked- equal and bilateral Secured at: 24 cm Tube secured with: Tape Dental Injury: Teeth and Oropharynx as per pre-operative assessment

## 2019-11-17 NOTE — Transfer of Care (Signed)
Immediate Anesthesia Transfer of Care Note  Patient: [facility]  Procedure(s) Performed: THROMBECTOMY ARTERIOVENOUS GRAFT LEFT THIGH (Left Groin) Lower Extremity Fistulogram (Left Leg Upper)  Patient Location: PACU  Anesthesia Type:General  Level of Consciousness: drowsy  Airway & Oxygen Therapy: Patient Spontanous Breathing and Patient connected to face mask oxygen  Post-op Assessment: Report given to RN and Post -op Vital signs reviewed and stable  Post vital signs: Reviewed and stable  Last Vitals:  Vitals Value Taken Time  BP 175/86 11/17/19 1653  Temp 36.6 C 11/17/19 1653  Pulse 101 11/17/19 1656  Resp 24 11/17/19 1656  SpO2 98 % 11/17/19 1656  Vitals shown include unvalidated device data.  Last Pain:  Vitals:   11/17/19 1215  TempSrc:   PainSc: 0-No pain         Complications: No apparent anesthesia complications

## 2019-11-17 NOTE — H&P (Signed)
Hospital Consult    Reason for Consult:  Left femoral graft thrombectomy  MRN #:  [id number]  History of Present Illness: 78 y.o. male, sent for left femoral graft thrombectomy.  He had a left thigh AV graft by Dr. Oneida Alar on 11/10/19 last week.  States it went down on Monday last week when he could no longer feel a thrill.  History of left basilic vein transposition fistula 4765, right arm basilic vein fistula 4650 left upper arm graft upper arm graft 2019, right upper arm graft November 2019.  His right upper arm graft has now occluded on several occasions.  He does not have any claudication symptoms in his legs although he does not walk much.  He is on coumadin.  Past Medical History:  Diagnosis Date  . 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 Acute on chronic systolic and diastolic heart failure, NYHA class 4 ([place])   . Anemia, iron deficiency 11/15/2011  . Arthritis    "hands, right knee, feet" (02/21/2018)  . Atrial fibrillation ([place])   . Cancer [name])    hx of prostate; s/p radioactive seed implant 10/2009 Dr Janice Norrie  . Cardiac arrest ([place]) 02/17/2018  . CHF (congestive heart failure) ([place])   . Chronic combined systolic and diastolic heart failure, NYHA class 3 ([place]) 06/2010   felt to be secondary to hypertensive cardiomyopathy  . Chronic lower back pain   . CKD (chronic kidney disease) stage V requiring chronic dialysis ([place])   . ESRD on dialysis [facility])    started 02/2018 [place]  . GERD (gastroesophageal reflux disease)   . Glaucoma    "I had surgery and dont have it any more"  . Gout    daily RX (02/21/2018)  . Heart murmur    "mild" per pt  . Hepatitis    years ago  . HIV (human immunodeficiency virus infection) ([place])   . HIV infection ([place])   . Hyperlipidemia   . Hypertension    followed by [facility] and Vascular (Dr Dani Gobble Croitoru)  . Hypertension   . Nonischemic cardiomyopathy ([place])   . Pneumonia 11/2017  . Prostate cancer ([place])   .  Sinus bradycardia   . Sleep apnea    does not use a cpap  . Stroke [facility])    "mini stroke" years ago    Past Surgical History:  Procedure Laterality Date  . AV FISTULA PLACEMENT Right 10/13/2016   Procedure: ARTERIOVENOUS (AV) FISTULA CREATION;  Surgeon: [name], MD;  Location: [place];  Service: Vascular;  Laterality: Right;  . AV FISTULA PLACEMENT Left 02/25/2018   Procedure: INSERTION OF ARTERIOVENOUS (AV) GORE-TEX GRAFT LEFT UPPER ARM;  Surgeon: Angelia Mould, MD;  Location: [place];  Service: Vascular;  Laterality: Left;  . AV FISTULA PLACEMENT Right 08/07/2018   Procedure: Creation of right arm brachiocephalic Fistula;  Surgeon: Waynetta Sandy, MD;  Location: [facility] OR;  Service: Vascular;  Laterality: Right;  . AV FISTULA PLACEMENT Left 11/10/2019   Procedure: INSERTION OF ARTERIOVENOUS (AV) GORE-TEX GRAFT THIGH;  Surgeon: Elam Dutch, MD;  Location: [place];  Service: Vascular;  Laterality: Left;  . BASCILIC VEIN TRANSPOSITION Left 06/24/2015   Procedure: BASILIC VEIN TRANSPOSITION;  Surgeon: Mal Misty, MD;  Location: [place];  Service: Vascular;  Laterality: Left;  . BIOPSY  03/14/2019   Procedure: BIOPSY;  Surgeon: Irene Shipper, MD;  Location: [facility] ENDOSCOPY;  Service: Endoscopy;;  . CATARACT EXTRACTION, BILATERAL Bilateral   . COLONOSCOPY  WITH PROPOFOL N/A 03/14/2019   Procedure: COLONOSCOPY WITH PROPOFOL;  Surgeon: Irene Shipper, MD;  Location: [place];  Service: Endoscopy;  Laterality: N/A;  . ESOPHAGOGASTRODUODENOSCOPY (EGD) WITH PROPOFOL N/A 05/05/2014   Procedure: ESOPHAGOGASTRODUODENOSCOPY (EGD) WITH PROPOFOL;  Surgeon: Missy Sabins, MD;  Location: [facility] ENDOSCOPY;  Service: Endoscopy;  Laterality: N/A;  . EYE SURGERY Bilateral    cataract surgery   . GLAUCOMA SURGERY Bilateral   . INSERTION OF ARTERIOVENOUS (AV) ARTEGRAFT ARM  10/09/2018   Procedure: INSERTION OF ARTERIOVENOUS (AV) 102mm x 41cm ARTEGRAFT LEFT UPPER ARM;  Surgeon: Waynetta Sandy,  MD;  Location: [place];  Service: Vascular;;  . INSERTION OF DIALYSIS CATHETER Right 07/14/2019   Procedure: Insertion Of Dialysis Catheter;  Surgeon: Elam Dutch, MD;  Location: [facility] OR;  Service: Vascular;  Laterality: Right;  placed right Internal Jugular  . INSERTION PROSTATE RADIATION SEED    . IR FLUORO GUIDE CV LINE RIGHT  02/18/2018  . IR REMOVAL TUN CV CATH W/O FL  12/06/2018  . IR US GUIDE VASC ACCESS RIGHT  02/18/2018  . LEFT HEART CATH AND CORONARY ANGIOGRAPHY N/A 02/20/2018   Procedure: LEFT HEART CATH AND CORONARY ANGIOGRAPHY;  Surgeon: Jettie Booze, MD;  Location: [place] CV LAB;;; 50% ostial OM2, 25% mLAD.  Marland Kitchen LEFT HEART CATH AND CORONARY ANGIOGRAPHY  2004   mildly depressed LV systolic fx EF 94%,RDEYCX coronaries/abdominal aorta/renal arteries.  Marland Kitchen NM MYOCAR PERF WALL MOTION  02/21/2010   No ischemia or infarction.  EF 27%.  Marland Kitchen RADIOACTIVE SEED IMPLANT  2010   prostate cancer  . REVISION OF ARTERIOVENOUS GORETEX GRAFT Right 07/11/2019   Procedure: REVISION OF ARTERIOVENOUS GORETEX GRAFT RIGHT ARM WITH ARTEGRAFT;  Surgeon: Serafina Mitchell, MD;  Location: [place];  Service: Vascular;  Laterality: Right;  . SHUNTOGRAM Right 07/14/2019   Procedure: Shuntogram;  Surgeon: Elam Dutch, MD;  Location: [place];  Service: Vascular;  Laterality: Right;  . THROMBECTOMY AND REVISION OF ARTERIOVENTOUS (AV) GORETEX  GRAFT Right 07/14/2019   Procedure: THROMBECTOMY AND REVISION OF ARTERIOVENTOUS (AV) GORETEX  GRAFT;  Surgeon: Elam Dutch, MD;  Location: [place];  Service: Vascular;  Laterality: Right;  . THROMBECTOMY W/ EMBOLECTOMY Left 02/26/2018   Procedure: THROMBECTOMY ARTERIOVENOUS GRAFT;  Surgeon: Angelia Mould, MD;  Location: [place];  Service: Vascular;  Laterality: Left;  . TRANSTHORACIC ECHOCARDIOGRAM  02/2012   [facility]) EF 45-50% with mild global HK.  Mild LVH,LA mod. dilated,mild-mod. 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 AI.  Marland Kitchen  TRANSTHORACIC ECHOCARDIOGRAM  9/'18; 3/'19   a) Severely reduced EF.  25 from 30%.  GR 1 DD with diffuse ecchymosis. Mild AS & AI.  Mild LA/RA dilation; b) (post PEA arest):   Moderately dilated LV with moderate concentric virtually.  EF 15 to 20%.  GR 1 DD.  Paradoxical septal motion. Mild AI.  Mild LA dilation.  Poorly visualized RV.  Marland Kitchen TRANSTHORACIC ECHOCARDIOGRAM  02/2019   EF 20-25%.  Unable to assess diastolic function (A. Fib).  No LV apical thrombus.  Mild AI.  Mildly reduced RV function, however poorly visualized.  Marland Kitchen ULTRASOUND GUIDANCE FOR VASCULAR ACCESS  02/20/2018   Procedure: Ultrasound Guidance For Vascular Access;  Surgeon: Jettie Booze, MD;  Location: [place] CV LAB;  Service: Cardiovascular;;  . UPPER EXTREMITY VENOGRAPHY N/A 07/08/2018   Procedure: UPPER EXTREMITY VENOGRAPHY;  Surgeon: Waynetta Sandy, MD;  Location: [place] CV LAB;  Service: Cardiovascular;  Laterality: N/A;  Bilateral    Allergies  Allergen Reactions  . Dextromethorphan-Guaifenesin     Other reaction(s): Unknown  . Tocotrienols     Other reaction(s): Unknown  . Losartan Potassium Other (See Comments)    Causes constipation  Other reaction(s): Unknown    Prior to Admission medications   Medication Sig Start Date End Date Taking? Authorizing Provider  acetaminophen (TYLENOL) 500 MG tablet Take 1,000 mg by mouth every 6 (six) hours as needed for moderate pain.   Yes [provider]  amiodarone (PACERONE) 200 MG tablet Take 1 tablet (200 mg total) by mouth daily. 05/12/19  Yes Croitoru, Mihai, MD  bictegravir-emtricitabine-tenofovir AF ([name]) 50-200-25 MG TABS tablet Take 1 tablet by mouth daily. 09/29/19  Yes Tommy Medal, Lavell Islam, MD  brimonidine (ALPHAGAN) 0.15 % ophthalmic solution Place 1 drop into the left eye 2 (two) times daily. 11/15/17  Yes [provider]  dorzolamide-timolol (COSOPT) 22.3-6.8 MG/ML ophthalmic solution Place 1 drop into the left eye 2  (two) times daily. 12/17/17  Yes [provider]  ferric citrate (AURYXIA) 1 GM 210 MG(Fe) tablet Take 420 mg by mouth 3 (three) times daily with meals.   Yes [provider]  latanoprost (XALATAN) 0.005 % ophthalmic solution Place 1 drop into the left eye at bedtime.    Yes [provider]  loratadine (CLARITIN) 10 MG tablet Take 10 mg by mouth daily.   Yes [provider]  midodrine (PROAMATINE) 10 MG tablet Take 1 tablet (10 mg total) by mouth as directed. Twice a day on the day of HD Tuesday, Thursday, Saturday (1 in AM and 1 at [facility]). Take 1/2 tablet (5mg ) a day on non HD days Patient taking differently: Take 10 mg by mouth See admin instructions. Take 1 tablet (10 mg) by mouth 3 times daily on Tuesdays, Thursdays, & Saturdays. 04/22/19  Yes Leonie Man, MD  montelukast (SINGULAIR) 10 MG tablet Take 1 tablet (10 mg total) by mouth at bedtime. 09/12/19  Yes [name], NP  multivitamin (RENA-VIT) TABS tablet Take 1 tablet by mouth daily at 2 PM.    Yes [provider]  VELPHORO 500 MG chewable tablet Chew 500 mg by mouth 3 (three) times daily with meals.  09/29/19  Yes [provider]  warfarin (COUMADIN) 5 MG tablet TAKE 1/2 TO 1 TABLET BY MOUTH DAILY AS DIRECTED BY COUMADIN CLINIC 11/04/19  Yes Leonie Man, MD  allopurinol (ZYLOPRIM) 100 MG tablet 1 tablet by mouth 3x weekly after dialysis. 09/15/19   [name], NP    Social History   Socioeconomic History  . Marital status: Married    Spouse name: Not on file  . Number of children: 2  . Years of education: 49  . Highest education level: Not on file  Occupational History  . 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 Smoking status: Former Smoker    Packs/day: 1.00    Years: 30.00    Pack years: 30.00    Types: Cigarettes    Quit date: 2006    Years since quitting: 14.9  . Smokeless tobacco: Never Used  Substance  and Sexual Activity  . Alcohol use: Yes    Alcohol/week: 1.0 standard drinks    Types: 1 Shots of liquor per week  . Drug use: Never  . Sexual activity: Not Currently    Comment: declined condoms 09/2019  Other Topics Concern  . Not  on file  Social History Narrative   ** Merged History Encounter **       Stays active at home Regular exercise: no Drinks 2 cups of coffee a week, 1 mountain dew soda a day.   Social Determinants of Health   Financial Resource Strain:   . Difficulty of Paying Living Expenses: Not on file  Food Insecurity: No Food Insecurity  . Worried About Charity fundraiser in the Last Year: Never true  . Ran Out of Food in the Last Year: Never true  Transportation Needs: No Transportation Needs  . Lack of Transportation (Medical): No  . Lack of Transportation (Non-Medical): No  Physical Activity:   . Days of Exercise per Week: Not on file  . Minutes of Exercise per Session: Not on file  Stress:   . Feeling of Stress : Not on file  Social Connections:   . Frequency of Communication with Friends and Family: Not on file  . Frequency of Social Gatherings with Friends and Family: Not on file  . Attends Religious Services: Not on file  . Active Member of Clubs or Organizations: Not on file  . Attends Archivist Meetings: Not on file  . Marital Status: Not on file  Intimate Partner Violence:   . Fear of Current or Ex-Partner: Not on file  . Emotionally Abused: Not on file  . Physically Abused: Not on file  . Sexually Abused: Not on file     Family History  Problem Relation Age of Onset  . Hypertension Mother   . Thyroid disease Mother   . Cholelithiasis Daughter   . Cholelithiasis Son   . Hypertension Maternal Grandmother   . Diabetes Maternal Grandmother   . Heart attack Neg Hx   . Hyperlipidemia Neg Hx     ROS: [x]  Positive   [ ]  Negative   [ ]  All sytems reviewed and are negative  Cardiovascular: []  chest pain/pressure []   palpitations []  SOB lying flat []  DOE []  pain in legs while walking []  pain in legs at rest []  pain in legs at night []  non-healing ulcers []  hx of DVT []  swelling in legs  Pulmonary: []  productive cough []  asthma/wheezing []  home O2  Neurologic: []  weakness in []  arms []  legs []  numbness in []  arms []  legs []  hx of CVA []  mini stroke [] difficulty speaking or slurred speech []  temporary loss of vision in one eye []  dizziness  Hematologic: []  hx of cancer []  bleeding problems []  problems with blood clotting easily  Endocrine:   []  diabetes []  thyroid disease  GI []  vomiting blood []  blood in stool  GU: []  CKD/renal failure []  HD--[]  M/W/F or []  T/T/S []  burning with urination []  blood in urine  Psychiatric: []  anxiety []  depression  Musculoskeletal: []  arthritis []  joint pain  Integumentary: []  rashes []  ulcers  Constitutional: []  fever []  chills   Physical Examination  Vitals:   11/17/19 1137  BP: (!) 144/68  Pulse: 70  Resp: 18  Temp: 98.3 F (36.8 C)  SpO2: 100%   Body mass index is 23.5 kg/m.  General: Laying supine, no distress Left femoral loop graft no thrill No hematoma left groin incisions  CBC    Component Value Date/Time   WBC 10.0 11/01/2019 0059   RBC 3.24 (L) 11/01/2019 0059   HGB 12.9 (L) 11/17/2019 1226   HGB 11.1 (L) 03/06/2013 1138   HCT 38.0 (L) 11/17/2019 1226   HCT 34.3 (L) 03/12/2019 8563  HCT 33.8 (L) 03/06/2013 1138   PLT 192 11/01/2019 0059   PLT 177 03/06/2013 1138   MCV 111.1 (H) 11/01/2019 0059   MCV 101.4 (A) 04/24/2018 1742   MCV 95 03/06/2013 1138   MCH 36.4 (H) 11/01/2019 0059   MCHC 32.8 11/01/2019 0059   RDW 14.3 11/01/2019 0059   RDW 13.4 03/06/2013 1138   LYMPHSABS 1.6 11/01/2019 0059   LYMPHSABS 1.5 03/06/2013 1138   MONOABS 0.6 11/01/2019 0059   EOSABS 0.6 (H) 11/01/2019 0059   EOSABS 0.3 03/06/2013 1138   BASOSABS 0.1 11/01/2019 0059   BASOSABS 0.0 03/06/2013 1138    BMET      Component Value Date/Time   NA 137 11/17/2019 1226   NA 142 04/24/2018 1641   K 5.0 11/17/2019 1226   CL 98 11/17/2019 1226   CO2 26 11/02/2019 0259   GLUCOSE 88 11/17/2019 1226   BUN 25 (H) 11/17/2019 1226   BUN 31 (H) 04/24/2018 1641   CREATININE 5.60 (H) 11/17/2019 1226   CREATININE 7.71 (H) 03/19/2019 1140   CALCIUM 8.9 11/02/2019 0259   CALCIUM 8.6 03/14/2016 0925   GFRNONAA 6 (L) 11/02/2019 0259   GFRNONAA 6 (L) 03/19/2019 1140   GFRAA 7 (L) 11/02/2019 0259   GFRAA 7 (L) 03/19/2019 1140    COAGS: Lab Results  Component Value Date   INR 1.6 (H) 11/17/2019   INR 1.4 (A) 11/13/2019   INR 1.2 11/10/2019     Non-Invasive Vascular Imaging:    None   ASSESSMENT/PLAN: This is a 78 y.o. male with end-stage renal disease that presents for left thigh AV graft thrombectomy.  This was placed last week by Dr. Oneida Alar on 11/10/2019.  Patient states this thrombosed postop day 0 after he got home.  His Covid test is negative.  His INR is 1.6 and his last Coumadin dose was yesterday.  Risk benefits discussed with patient in addition to discussing risk for continued failure even after thrombectomy.  Marty Heck, MD [facility] [place] Office: [phone] Pager: [place]

## 2019-11-17 NOTE — Op Note (Signed)
Date: November 17, 2019  Preoperative diagnosis: Thrombosed left thigh AV graft  Postoperative diagnosis: Same  Procedure: 1.  Thrombectomy of left thigh AV graft 2.  Left thigh AV graft fistulogram  Surgeon: Dr. Marty Heck, MD  Assistant: Roxy Horseman, [place]  Indications: Patient is a 78 year old male with end-stage renal disease that underwent left thigh AV graft placement last week with Dr. Oneida Alar.  He presents today with thrombosed left thigh AV loop graft with plans for thrombectomy after risk and benefits were discussed.  Findings: Transverse arteriotomy was made in the graft just before the venous anastomosis with no flow.  Ultimately passed a #3/#4 Fogarty catheter toward the arterial side and got an arterial plug with excellent pulsatile inflow.  I then passed Fogarty's through the venous anastomosis and retrieved additional fresh thrombus.  I was able to pass a #5 dilator through the venous anastomosis without any significant difficulty.  Once we closed the transverse arteriotomy there was slight pulsatility of the graft.  I did stick the arterial side of the graft and shot a fistulogram that did not show any overt venous stenosis with a patent iliac vein and no kinking in the graft.  Anesthesia: LMA  Details: Patient was taken to the operating room after informed consent was obtained.  He was placed on the operative table in the supine position.  Left groin and thigh were then prepped in the usual sterile fashion.  I opened his previous left groin incision with 11 blade scalpel and the sutures were opened with Metzenbaum scissors.  Ultimately when I got down to the graft there was no flow in the graft.  The graft was then opened with 11 blade scalpel with a transverse arteriotomy just before the venous anastomosis.  I passed a #3 #4 Fogarty's toward the arterial limb and got multiple plugs of fresh appearing thrombus including large arterial plug with excellent pulsatile  inflow.  I then clamped the inflow side of the graft with a fistula clamp.  The patient was given 3000 units IV heparin.  I then passed #3 #4 Fogarty's across the venous anastomosis and got additional fresh thrombus as well as venous backbleeding.  I was able to pass a #5 dilator across the venous anastomosis without significant difficulty.  That point in time the transverse arteriotomy in the graft was closed with a 6-0 Prolene in running fashion.  Everything was de-aired prior to completion.  The graft did have slight pulsatility so for completeness I did perform a fistulogram.  I stuck a butterfly needle into the arterial limb of the graft and shot a fistulogram of the graft that did not show any kinking.  In addition the venous anastomosis looked patent as well as the left femoral vein and iliac vein.  The groin was then copiously irrigated.  The subcutaneous tissue was then closed in multiple layers of 3-0 Vicryl and then 4-0 Monocryl and Dermabond in the skin.  He will be taken to PACU in stable condition.  Complication: None  Condition: Stable  Marty Heck, MD [facility] [place] Office: [phone] Pager: [place]

## 2019-11-17 NOTE — Progress Notes (Signed)
Call to [place] at  [facility]. Patient instructed to be NPO till surgery and follow instructions per Dr. Oneida Alar. Patient to report to [facility] ER for pending thrombectomy of thigh A/V graft today. STAT INR on arrival to hospital. Call to triage nurse Nicki at [facility] ER with instructions.

## 2019-11-18 ENCOUNTER — Ambulatory Visit (INDEPENDENT_AMBULATORY_CARE_PROVIDER_SITE_OTHER): Payer: Medicare HMO | Admitting: Family

## 2019-11-18 DIAGNOSIS — N186 End stage renal disease: Secondary | ICD-10-CM | POA: Diagnosis not present

## 2019-11-18 DIAGNOSIS — I509 Heart failure, unspecified: Secondary | ICD-10-CM

## 2019-11-18 DIAGNOSIS — N2581 Secondary hyperparathyroidism of renal origin: Secondary | ICD-10-CM | POA: Diagnosis not present

## 2019-11-18 DIAGNOSIS — J9601 Acute respiratory failure with hypoxia: Secondary | ICD-10-CM | POA: Diagnosis not present

## 2019-11-18 DIAGNOSIS — E8770 Fluid overload, unspecified: Secondary | ICD-10-CM | POA: Diagnosis not present

## 2019-11-18 DIAGNOSIS — Z992 Dependence on renal dialysis: Secondary | ICD-10-CM | POA: Diagnosis not present

## 2019-11-18 NOTE — Progress Notes (Signed)
Virtual Visit via Telephone Note  I connected with [facility] on 11/18/19 at  5:00 PM EST by telephone and verified that I am speaking with the correct person using two identifiers.  Location: Patient: home Provider: home   I discussed the limitations, risks, security and privacy concerns of performing an evaluation and management service by telephone and the availability of in person appointments. I also discussed with the patient that there may be a patient responsible charge related to this service. The patient expressed understanding and agreed to proceed.   History of Present Illness:  Patient is a 78 yr old male who presents today for hospital follow up.  He was admitted on 11/28   Patient is a 78 yr old male who presented on 11/01/19-11/02/19 with acute respiratory failure.  He had apparently gotten off of schedule due to the holidays.  He was admitted to the hospital and underwent dialysis.  His shortness of breath resolved.  He was ultimately discharged to home.  He reports that since he was discharged home he has been feeling well.  He denies shortness of breath, chest pain, or swelling.  He underwent a left thigh AV graft on 11/10/2019.  This was placed by Dr. Juanda Crumble fields.  This apparently thrombosed postop day 0 after he got home.  He returned for thrombectomy on 11/16/2019.  Per patient the thrombectomy was unsuccessful.  He states he is currently being dialyzed through a catheter.  He is awaiting further instructions from the vascular group on next steps.   Past Medical History:  Diagnosis Date  . 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 Acute on chronic systolic and diastolic heart failure, NYHA class 4 ([place])   . Anemia, iron deficiency 11/15/2011  . Arthritis    "hands, right knee, feet" (02/21/2018)  . Atrial fibrillation ([place])   . Cancer [facility])    hx of prostate; s/p radioactive seed implant 10/2009 Dr Janice Norrie  . Cardiac arrest ([place]) 02/17/2018  . CHF (congestive heart  failure) ([place])   . Chronic combined systolic and diastolic heart failure, NYHA class 3 ([place]) 06/2010   felt to be secondary to hypertensive cardiomyopathy  . Chronic lower back pain   . CKD (chronic kidney disease) stage V requiring chronic dialysis ([place])   . ESRD on dialysis [facility])    started 02/2018 [place]  . GERD (gastroesophageal reflux disease)   . Glaucoma    "I had surgery and dont have it any more"  . Gout    daily RX (02/21/2018)  . Heart murmur    "mild" per pt  . Hepatitis    years ago  . HIV (human immunodeficiency virus infection) ([place])   . HIV infection ([place])   . Hyperlipidemia   . Hypertension    followed by [facility] (Dr Dani Gobble Cro[place]u)  . Hypertension   . Nonischemic cardiomyopathy ([place])   . Pneumonia 11/2017  . Prostate cancer ([place])   . Sinus bradycardia   . Sleep apnea    does not use a cpap  . Stroke [facility])    "mini stroke" years ago     Social History   Socioeconomic History  . Marital status: Married    Spouse name: Not on file  . Number of children: 2  . Years of education: 55  . Highest education level: Not on file  Occupational History  . 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  Smoking status: Former Smoker    Packs/day: 1.00    Years: 30.00    Pack years: 30.00    Types: Cigarettes    Quit date: 2006    Years since quitting: 14.9  . Smokeless tobacco: Never Used  Substance and Sexual Activity  . Alcohol use: Yes    Alcohol/week: 1.0 standard drinks    Types: 1 Shots of liquor per week  . Drug use: Never  . Sexual activity: Not Currently    Comment: declined condoms 09/2019  Other Topics Concern  . Not on file  Social History Narrative   ** Merged History Encounter **       Stays active at home Regular exercise: no Drinks 2 cups of coffee a week, 1 mountain dew soda a day.   Social Determinants of Health   Financial Resource Strain:    . Difficulty of Paying Living Expenses: Not on file  Food Insecurity: No Food Insecurity  . Worried About Charity fundraiser in the Last Year: Never true  . Ran Out of Food in the Last Year: Never true  Transportation Needs: No Transportation Needs  . Lack of Transportation (Medical): No  . Lack of Transportation (Non-Medical): No  Physical Activity:   . Days of Exercise per Week: Not on file  . Minutes of Exercise per Session: Not on file  Stress:   . Feeling of Stress : Not on file  Social Connections:   . Frequency of Communication with Friends and Family: Not on file  . Frequency of Social Gatherings with Friends and Family: Not on file  . Attends Religious Services: Not on file  . Active Member of Clubs or Organizations: Not on file  . Attends Archivist Meetings: Not on file  . Marital Status: Not on file  Intimate Partner Violence:   . Fear of Current or Ex-Partner: Not on file  . Emotionally Abused: Not on file  . Physically Abused: Not on file  . Sexually Abused: Not on file    Past Surgical History:  Procedure Laterality Date  . AV FISTULA PLACEMENT Right 10/13/2016   Procedure: ARTERIOVENOUS (AV) FISTULA CREATION;  Surgeon: [name], MD;  Location: [place];  Service: Vascular;  Laterality: Right;  . AV FISTULA PLACEMENT Left 02/25/2018   Procedure: INSERTION OF ARTERIOVENOUS (AV) GORE-TEX GRAFT LEFT UPPER ARM;  Surgeon: Angelia Mould, MD;  Location: [place];  Service: Vascular;  Laterality: Left;  . AV FISTULA PLACEMENT Right 08/07/2018   Procedure: Creation of right arm brachiocephalic Fistula;  Surgeon: Waynetta Sandy, MD;  Location: [facility] OR;  Service: Vascular;  Laterality: Right;  . AV FISTULA PLACEMENT Left 11/10/2019   Procedure: INSERTION OF ARTERIOVENOUS (AV) GORE-TEX GRAFT THIGH;  Surgeon: Elam Dutch, MD;  Location: [place];  Service: Vascular;  Laterality: Left;  . BASCILIC VEIN TRANSPOSITION Left 06/24/2015   Procedure: BASILIC  VEIN TRANSPOSITION;  Surgeon: Mal Misty, MD;  Location: [place];  Service: Vascular;  Laterality: Left;  . BIOPSY  03/14/2019   Procedure: BIOPSY;  Surgeon: Irene Shipper, MD;  Location: [place];  Service: Endoscopy;;  . CATARACT EXTRACTION, BILATERAL Bilateral   . COLONOSCOPY WITH PROPOFOL N/A 03/14/2019   Procedure: COLONOSCOPY WITH PROPOFOL;  Surgeon: Irene Shipper, MD;  Location: [facility] ENDOSCOPY;  Service: Endoscopy;  Laterality: N/A;  . ESOPHAGOGASTRODUODENOSCOPY (EGD) WITH PROPOFOL N/A 05/05/2014   Procedure: ESOPHAGOGASTRODUODENOSCOPY (EGD) WITH PROPOFOL;  Surgeon: Missy Sabins, MD;  Location: [facility] ENDOSCOPY;  Service: Endoscopy;  Laterality: N/A;  . EYE SURGERY Bilateral    cataract surgery   . GLAUCOMA SURGERY Bilateral   . INSERTION OF ARTERIOVENOUS (AV) ARTEGRAFT ARM  10/09/2018   Procedure: INSERTION OF ARTERIOVENOUS (AV) 19mm x 41cm ARTEGRAFT LEFT UPPER ARM;  Surgeon: Waynetta Sandy, MD;  Location: [place];  Service: Vascular;;  . INSERTION OF DIALYSIS CATHETER Right 07/14/2019   Procedure: Insertion Of Dialysis Catheter;  Surgeon: Elam Dutch, MD;  Location: [facility] OR;  Service: Vascular;  Laterality: Right;  placed right Internal Jugular  . INSERTION PROSTATE RADIATION SEED    . IR FLUORO GUIDE CV LINE RIGHT  02/18/2018  . IR REMOVAL TUN CV CATH W/O FL  12/06/2018  . IR US GUIDE VASC ACCESS RIGHT  02/18/2018  . LEFT HEART CATH AND CORONARY ANGIOGRAPHY N/A 02/20/2018   Procedure: LEFT HEART CATH AND CORONARY ANGIOGRAPHY;  Surgeon: Jettie Booze, MD;  Location: [place] CV LAB;;; 50% ostial OM2, 25% mLAD.  Marland Kitchen LEFT HEART CATH AND CORONARY ANGIOGRAPHY  2004   mildly depressed LV systolic fx EF 37%,TKWIOX coronaries/abdominal aorta/renal arteries.  . LOWER EXTREMITY ANGIOGRAM Left 11/17/2019   Procedure: Lower Extremity Fistulogram;  Surgeon: Marty Heck, MD;  Location: [place];  Service: Vascular;  Laterality: Left;  . NM [place]  02/21/2010   No  ischemia or infarction.  EF 27%.  Marland Kitchen RADIOACTIVE SEED IMPLANT  2010   prostate cancer  . REVISION OF ARTERIOVENOUS GORETEX GRAFT Right 07/11/2019   Procedure: REVISION OF ARTERIOVENOUS GORETEX GRAFT RIGHT ARM WITH ARTEGRAFT;  Surgeon: Serafina Mitchell, MD;  Location: [place];  Service: Vascular;  Laterality: Right;  . SHUNTOGRAM Right 07/14/2019   Procedure: Shuntogram;  Surgeon: Elam Dutch, MD;  Location: [place];  Service: Vascular;  Laterality: Right;  . THROMBECTOMY AND REVISION OF ARTERIOVENTOUS (AV) GORETEX  GRAFT Right 07/14/2019   Procedure: THROMBECTOMY AND REVISION OF ARTERIOVENTOUS (AV) GORETEX  GRAFT;  Surgeon: Elam Dutch, MD;  Location: [place];  Service: Vascular;  Laterality: Right;  . THROMBECTOMY W/ EMBOLECTOMY Left 02/26/2018   Procedure: THROMBECTOMY ARTERIOVENOUS GRAFT;  Surgeon: Angelia Mould, MD;  Location: [place];  Service: Vascular;  Laterality: Left;  . THROMBECTOMY W/ EMBOLECTOMY Left 11/17/2019   Procedure: THROMBECTOMY ARTERIOVENOUS GRAFT LEFT THIGH;  Surgeon: Marty Heck, MD;  Location: [place];  Service: Vascular;  Laterality: Left;  . TRANSTHORACIC ECHOCARDIOGRAM  02/2012   [facility]) EF 45-50% with mild global HK.  Mild LVH,LA mod. dilated,mild-mod. 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 AI.  Marland Kitchen TRANSTHORACIC ECHOCARDIOGRAM  9/'18; 3/'19   a) Severely reduced EF.  25 from 30%.  GR 1 DD with diffuse ecchymosis. Mild AS & AI.  Mild LA/RA dilation; b) (post PEA arest):   Moderately dilated LV with moderate concentric virtually.  EF 15 to 20%.  GR 1 DD.  Paradoxical septal motion. Mild AI.  Mild LA dilation.  Poorly visualized RV.  Marland Kitchen TRANSTHORACIC ECHOCARDIOGRAM  02/2019   EF 20-25%.  Unable to assess diastolic function (A. Fib).  No LV apical thrombus.  Mild AI.  Mildly reduced RV function, however poorly visualized.  Marland Kitchen ULTRASOUND GUIDANCE FOR VASCULAR ACCESS  02/20/2018   Procedure: Ultrasound Guidance For Vascular Access;   Surgeon: Jettie Booze, MD;  Location: [place] CV LAB;  Service: Cardiovascular;;  . UPPER EXTREMITY VENOGRAPHY N/A 07/08/2018   Procedure: UPPER EXTREMITY VENOGRAPHY;  Surgeon: Waynetta Sandy, MD;  Location: [place] CV LAB;  Service: Cardiovascular;  Laterality: N/A;  Bilateral    Family History  Problem Relation Age of Onset  . Hypertension Mother   . Thyroid disease Mother   . Cholelithiasis Daughter   . Cholelithiasis Son   . Hypertension Maternal Grandmother   . Diabetes Maternal Grandmother   . Heart attack Neg Hx   . Hyperlipidemia Neg Hx     Allergies  Allergen Reactions  . Dextromethorphan-Guaifenesin     Other reaction(s): Unknown  . Tocotrienols     Other reaction(s): Unknown  . Losartan Potassium Other (See Comments)    Causes constipation  Other reaction(s): Unknown    Current Outpatient Medications on File Prior to Visit  Medication Sig Dispense Refill  . acetaminophen (TYLENOL) 500 MG tablet Take 1,000 mg by mouth every 6 (six) hours as needed for moderate pain.    Marland Kitchen allopurinol (ZYLOPRIM) 100 MG tablet 1 tablet by mouth 3x weekly after dialysis. 90 tablet 1  . amiodarone (PACERONE) 200 MG tablet Take 1 tablet (200 mg total) by mouth daily. 90 tablet 3  . bictegravir-emtricitabine-tenofovir AF ([name]) 50-200-25 MG TABS tablet Take 1 tablet by mouth daily. 30 tablet 5  . brimonidine (ALPHAGAN) 0.15 % ophthalmic solution Place 1 drop into the left eye 2 (two) times daily.  3  . dorzolamide-timolol (COSOPT) 22.3-6.8 MG/ML ophthalmic solution Place 1 drop into the left eye 2 (two) times daily.    . ferric citrate (AURYXIA) 1 GM 210 MG(Fe) tablet Take 420 mg by mouth 3 (three) times daily with meals.    . latanoprost (XALATAN) 0.005 % ophthalmic solution Place 1 drop into the left eye at bedtime.     Marland Kitchen loratadine (CLARITIN) 10 MG tablet Take 10 mg by mouth daily.    . midodrine (PROAMATINE) 10 MG tablet Take 1 tablet (10 mg total) by mouth  as directed. Twice a day on the day of HD Tuesday, Thursday, Saturday (1 in AM and 1 at [place]). Take 1/2 tablet (5mg ) a day on non HD days (Patient taking differently: Take 10 mg by mouth See admin instructions. Take 1 tablet (10 mg) by mouth 3 times daily on Tuesdays, Thursdays, & Saturdays.) 90 tablet 5  . montelukast (SINGULAIR) 10 MG tablet Take 1 tablet (10 mg total) by mouth at bedtime. 30 tablet 3  . multivitamin (RENA-VIT) TABS tablet Take 1 tablet by mouth daily at 2 PM.     . traMADol (ULTRAM) 50 MG tablet Take 1 tablet (50 mg total) by mouth every 6 (six) hours as needed. 10 tablet 0  . VELPHORO 500 MG chewable tablet Chew 500 mg by mouth 3 (three) times daily with meals.     . warfarin (COUMADIN) 5 MG tablet TAKE 1/2 TO 1 TABLET BY MOUTH DAILY AS DIRECTED BY COUMADIN CLINIC 90 tablet 0   No current facility-administered medications on file prior to visit.    There were no vitals taken for this visit.    Observations/Objective:   Gen: Awake, alert, no acute distress Resp: Breathing sounds even and non-labored Psych: calm/pleasant demeanor Neuro: Alert and Oriented x 3,  Speech sounds is clear.    Assessment and Plan:  End-stage renal disease-currently doing well on 4 times weekly hemodialysis.  He continues to have issues with excess.  This is being managed by ocular.  CHF-clinically euvolemic.  Acute respiratory failure-clinically stable.  This occurred in setting of volume overload.  Improved 4 times weekly dialysis  Will monitor. Follow  Up Instructions:    I discussed the assessment and treatment plan with the patient. The patient was provided an opportunity to ask questions and all were answered. The patient agreed with the plan and demonstrated an understanding of the instructions.   The patient was advised to call back or seek an in-person evaluation if the symptoms worsen or if the condition fails to improve as anticipated.  I provided 11 minutes of  non-face-to-face time during this encounter.   [name], NP

## 2019-11-20 DIAGNOSIS — N186 End stage renal disease: Secondary | ICD-10-CM | POA: Diagnosis not present

## 2019-11-20 DIAGNOSIS — E8770 Fluid overload, unspecified: Secondary | ICD-10-CM | POA: Diagnosis not present

## 2019-11-20 DIAGNOSIS — N2581 Secondary hyperparathyroidism of renal origin: Secondary | ICD-10-CM | POA: Diagnosis not present

## 2019-11-20 DIAGNOSIS — Z992 Dependence on renal dialysis: Secondary | ICD-10-CM | POA: Diagnosis not present

## 2019-11-22 DIAGNOSIS — Z992 Dependence on renal dialysis: Secondary | ICD-10-CM | POA: Diagnosis not present

## 2019-11-22 DIAGNOSIS — N186 End stage renal disease: Secondary | ICD-10-CM | POA: Diagnosis not present

## 2019-11-22 DIAGNOSIS — E8770 Fluid overload, unspecified: Secondary | ICD-10-CM | POA: Diagnosis not present

## 2019-11-22 DIAGNOSIS — N2581 Secondary hyperparathyroidism of renal origin: Secondary | ICD-10-CM | POA: Diagnosis not present

## 2019-11-24 DIAGNOSIS — Z992 Dependence on renal dialysis: Secondary | ICD-10-CM | POA: Diagnosis not present

## 2019-11-24 DIAGNOSIS — N2581 Secondary hyperparathyroidism of renal origin: Secondary | ICD-10-CM | POA: Diagnosis not present

## 2019-11-24 DIAGNOSIS — E8770 Fluid overload, unspecified: Secondary | ICD-10-CM | POA: Diagnosis not present

## 2019-11-24 DIAGNOSIS — N186 End stage renal disease: Secondary | ICD-10-CM | POA: Diagnosis not present

## 2019-11-24 NOTE — Progress Notes (Signed)
POST OPERATIVE OFFICE NOTE    CC:  F/u for surgery  HPI:  This is a 78 y.o. male who is s/p thrombectomy of left thigh AVG and left thigh AVG fistulogram on 11/17/2019 by Dr. Carlis Abbott.   The thigh graft was originally placed on 11/10/2019 by Dr. Oneida Alar.  He had a TDC placed on 07/14/2019 by Dr. Oneida Alar.  Once the transverse arteriotomy was closed, there was slight pulsatility of the graft.  Dr. Carlis Abbott did stick the arterial side of the graft and shot a fistulogram that did not show any overt venous stenosis with a patent iliac vein and no kinking in the graft.  The pt returns today for follow up.   He states that his graft stopped working the next day after the thrombectomy.    Access Hx:  02/25/18:  LUA AVG 02/26/18:  Thrombectomy of LUA AVG 07/08/18:  BUE venogram Findings: His SVC and bilateral subclavian veins appear patent.  The right side is pain to the level of the axillary vein he does not appear to have any suitable veins for fistula creation but could have a right arm AV graft.  On the left side it appears that his subclavian vein might be filling via collaterals.  08/07/18:  Right 1st stage BVT 10/09/18:  RUA AVG 07/11/2019:  Revision of RUA AVG 07/14/2019:  Thrombectomy of RUA AVG and TDC placement 11/10/2019:  Left thigh graft placement 11/17/2019:  Thrombectomy and fistulogram of left thigh graft  Other chronic medical conditions include HIV, Afib on coumadin, CHF, HTN, HLD.  He quit smoking in 2006.  Allergies  Allergen Reactions  . Dextromethorphan-Guaifenesin     Other reaction(s): Unknown  . Tocotrienols     Other reaction(s): Unknown  . Losartan Potassium Other (See Comments)    Causes constipation  Other reaction(s): Unknown    Current Outpatient Medications  Medication Sig Dispense Refill  . acetaminophen (TYLENOL) 500 MG tablet Take 1,000 mg by mouth every 6 (six) hours as needed for moderate pain.    Marland Kitchen allopurinol (ZYLOPRIM) 100 MG tablet 1 tablet by mouth 3x weekly  after dialysis. 90 tablet 1  . amiodarone (PACERONE) 200 MG tablet Take 1 tablet (200 mg total) by mouth daily. 90 tablet 3  . bictegravir-emtricitabine-tenofovir AF ([name]) 50-200-25 MG TABS tablet Take 1 tablet by mouth daily. 30 tablet 5  . brimonidine (ALPHAGAN) 0.15 % ophthalmic solution Place 1 drop into the left eye 2 (two) times daily.  3  . dorzolamide-timolol (COSOPT) 22.3-6.8 MG/ML ophthalmic solution Place 1 drop into the left eye 2 (two) times daily.    . ferric citrate (AURYXIA) 1 GM 210 MG(Fe) tablet Take 420 mg by mouth 3 (three) times daily with meals.    . latanoprost (XALATAN) 0.005 % ophthalmic solution Place 1 drop into the left eye at bedtime.     Marland Kitchen loratadine (CLARITIN) 10 MG tablet Take 10 mg by mouth daily.    . midodrine (PROAMATINE) 10 MG tablet Take 1 tablet (10 mg total) by mouth as directed. Twice a day on the day of HD Tuesday, Thursday, Saturday (1 in AM and 1 at [employer]). Take 1/2 tablet (5mg ) a day on non HD days (Patient taking differently: Take 10 mg by mouth See admin instructions. Take 1 tablet (10 mg) by mouth 3 times daily on Tuesdays, Thursdays, & Saturdays.) 90 tablet 5  . montelukast (SINGULAIR) 10 MG tablet Take 1 tablet (10 mg total) by mouth at bedtime. 30 tablet 3  . multivitamin (  RENA-VIT) TABS tablet Take 1 tablet by mouth daily at 2 PM.     . traMADol (ULTRAM) 50 MG tablet Take 1 tablet (50 mg total) by mouth every 6 (six) hours as needed. 10 tablet 0  . VELPHORO 500 MG chewable tablet Chew 500 mg by mouth 3 (three) times daily with meals.     . warfarin (COUMADIN) 5 MG tablet TAKE 1/2 TO 1 TABLET BY MOUTH DAILY AS DIRECTED BY COUMADIN CLINIC 90 tablet 0   No current facility-administered medications for this visit.     ROS:  See HPI  Physical Exam:  Today's Vitals   11/26/19 1335  BP: 140/79  Pulse: 76  Resp: 20  Temp: 97.9 F (36.6 C)  TempSrc: Temporal  SpO2: 100%  Weight: 186 lb (84.4 kg)  Height: 6\' 2"  (1.88 m)   Body mass  index is 23.88 kg/m.   Incision:  Left groin incision and thigh incision are healed.  Extremities:  +palpable left DP pulse and brisk doppler signals right foot.   There is no thrill or bruit in the left thigh graft.    ABI's 10/16/2019: +-------+-----------+-----------+------------+------------+ ABI/TBIToday's ABIToday's TBIPrevious ABIPrevious TBI +-------+-----------+-----------+------------+------------+ Right  1.02       0.65                                +-------+-----------+-----------+------------+------------+ Left   1.23       0.68                                +-------+-----------+-----------+------------+------------+    Assessment/Plan:  This is a 78 y.o. male who is s/p:  thrombectomy of left thigh AVG and left thigh AVG fistulogram on 11/17/2019 by Dr. Carlis Abbott  -pt's left thigh graft clotted in a days time after thrombectomy.  Pt has hx of clotted access in the past (see HPI).   Discussed pt's access hx with Dr. Scot Dock and it was recommended pt have right thigh graft placed.  His ABI on the right on 11/12 was 1.02.   -he will need to be off his coumadin and will try to plan for this next week.  He does have hx of cardiac catheterization from the right groin in the past.    Leontine Locket, PA-C [facility] [phone]  Clinic MD:  Scot Dock

## 2019-11-25 DIAGNOSIS — N2581 Secondary hyperparathyroidism of renal origin: Secondary | ICD-10-CM | POA: Diagnosis not present

## 2019-11-25 DIAGNOSIS — N186 End stage renal disease: Secondary | ICD-10-CM | POA: Diagnosis not present

## 2019-11-25 DIAGNOSIS — Z992 Dependence on renal dialysis: Secondary | ICD-10-CM | POA: Diagnosis not present

## 2019-11-25 DIAGNOSIS — E8770 Fluid overload, unspecified: Secondary | ICD-10-CM | POA: Diagnosis not present

## 2019-11-25 LAB — PROTIME-INR: INR: 2.5 — AB (ref 0.9–1.1)

## 2019-11-26 ENCOUNTER — Other Ambulatory Visit: Payer: Self-pay

## 2019-11-26 ENCOUNTER — Other Ambulatory Visit: Payer: Self-pay | Admitting: *Deleted

## 2019-11-26 ENCOUNTER — Encounter: Payer: Self-pay | Admitting: *Deleted

## 2019-11-26 ENCOUNTER — Telehealth: Payer: Self-pay | Admitting: *Deleted

## 2019-11-26 ENCOUNTER — Ambulatory Visit (INDEPENDENT_AMBULATORY_CARE_PROVIDER_SITE_OTHER): Payer: Self-pay | Admitting: Physician Assistant

## 2019-11-26 VITALS — BP 140/79 | HR 76 | Temp 97.9°F | Resp 20 | Ht 74.0 in | Wt 186.0 lb

## 2019-11-26 DIAGNOSIS — N186 End stage renal disease: Secondary | ICD-10-CM

## 2019-11-26 DIAGNOSIS — Z992 Dependence on renal dialysis: Secondary | ICD-10-CM

## 2019-11-26 NOTE — Telephone Encounter (Signed)
Call to [place] and spoke with [facility] re: patient scheduled for surgery on Tuesday 12/02/2019. They will schedule HD to accommodate. Aware of nasal swab schedule and Coumadin hold.

## 2019-11-27 DIAGNOSIS — Z992 Dependence on renal dialysis: Secondary | ICD-10-CM | POA: Diagnosis not present

## 2019-11-27 DIAGNOSIS — E8770 Fluid overload, unspecified: Secondary | ICD-10-CM | POA: Diagnosis not present

## 2019-11-27 DIAGNOSIS — N186 End stage renal disease: Secondary | ICD-10-CM | POA: Diagnosis not present

## 2019-11-27 DIAGNOSIS — N2581 Secondary hyperparathyroidism of renal origin: Secondary | ICD-10-CM | POA: Diagnosis not present

## 2019-11-29 ENCOUNTER — Other Ambulatory Visit (HOSPITAL_COMMUNITY)
Admission: RE | Admit: 2019-11-29 | Discharge: 2019-11-29 | Disposition: A | Discharge status: Home / Self Care | Payer: Medicare HMO | Source: Ambulatory Visit | Attending: Vascular Surgery | Admitting: Vascular Surgery

## 2019-11-29 DIAGNOSIS — Z01812 Encounter for preprocedural laboratory examination: Secondary | ICD-10-CM | POA: Diagnosis not present

## 2019-11-29 DIAGNOSIS — Z20828 Contact with and (suspected) exposure to other viral communicable diseases: Secondary | ICD-10-CM | POA: Diagnosis not present

## 2019-11-29 LAB — SARS CORONAVIRUS 2 (TAT 6-24 HRS): SARS Coronavirus 2: NEGATIVE

## 2019-11-30 ENCOUNTER — Encounter (HOSPITAL_COMMUNITY): Payer: Self-pay

## 2019-11-30 ENCOUNTER — Emergency Department (HOSPITAL_COMMUNITY)
Admission: EM | Admit: 2019-11-30 | Discharge: 2019-11-30 | Disposition: A | Discharge status: Home / Self Care | Payer: Medicare HMO | Attending: Emergency Medicine | Admitting: Emergency Medicine

## 2019-11-30 ENCOUNTER — Emergency Department (HOSPITAL_COMMUNITY): Payer: Medicare HMO

## 2019-11-30 ENCOUNTER — Other Ambulatory Visit: Payer: Self-pay

## 2019-11-30 DIAGNOSIS — N186 End stage renal disease: Secondary | ICD-10-CM | POA: Insufficient documentation

## 2019-11-30 DIAGNOSIS — N2581 Secondary hyperparathyroidism of renal origin: Secondary | ICD-10-CM | POA: Diagnosis not present

## 2019-11-30 DIAGNOSIS — R0902 Hypoxemia: Secondary | ICD-10-CM | POA: Diagnosis not present

## 2019-11-30 DIAGNOSIS — I5043 Acute on chronic combined systolic (congestive) and diastolic (congestive) heart failure: Secondary | ICD-10-CM | POA: Diagnosis not present

## 2019-11-30 DIAGNOSIS — Z8673 Personal history of transient ischemic attack (TIA), and cerebral infarction without residual deficits: Secondary | ICD-10-CM | POA: Diagnosis not present

## 2019-11-30 DIAGNOSIS — Z79899 Other long term (current) drug therapy: Secondary | ICD-10-CM | POA: Diagnosis not present

## 2019-11-30 DIAGNOSIS — Z7901 Long term (current) use of anticoagulants: Secondary | ICD-10-CM | POA: Insufficient documentation

## 2019-11-30 DIAGNOSIS — I132 Hypertensive heart and chronic kidney disease with heart failure and with stage 5 chronic kidney disease, or end stage renal disease: Secondary | ICD-10-CM | POA: Diagnosis not present

## 2019-11-30 DIAGNOSIS — R609 Edema, unspecified: Secondary | ICD-10-CM | POA: Diagnosis not present

## 2019-11-30 DIAGNOSIS — Z8546 Personal history of malignant neoplasm of prostate: Secondary | ICD-10-CM | POA: Insufficient documentation

## 2019-11-30 DIAGNOSIS — Z87891 Personal history of nicotine dependence: Secondary | ICD-10-CM | POA: Insufficient documentation

## 2019-11-30 DIAGNOSIS — I10 Essential (primary) hypertension: Secondary | ICD-10-CM | POA: Diagnosis not present

## 2019-11-30 DIAGNOSIS — Z992 Dependence on renal dialysis: Secondary | ICD-10-CM | POA: Diagnosis not present

## 2019-11-30 DIAGNOSIS — R0602 Shortness of breath: Secondary | ICD-10-CM | POA: Diagnosis not present

## 2019-11-30 DIAGNOSIS — E8770 Fluid overload, unspecified: Secondary | ICD-10-CM | POA: Diagnosis not present

## 2019-11-30 DIAGNOSIS — I447 Left bundle-branch block, unspecified: Secondary | ICD-10-CM | POA: Diagnosis not present

## 2019-11-30 LAB — BASIC METABOLIC PANEL
Anion gap: 13 (ref 5–15)
BUN: 49 mg/dL — ABNORMAL HIGH (ref 8–23)
CO2: 24 mmol/L (ref 22–32)
Calcium: 9.5 mg/dL (ref 8.9–10.3)
Chloride: 105 mmol/L (ref 98–111)
Creatinine, Ser: 10.57 mg/dL — ABNORMAL HIGH (ref 0.61–1.24)
GFR calc Af Amer: 5 mL/min — ABNORMAL LOW (ref >60)
GFR calc non Af Amer: 4 mL/min — ABNORMAL LOW (ref >60)
Glucose, Bld: 92 mg/dL (ref 70–99)
Potassium: 5.4 mmol/L — ABNORMAL HIGH (ref 3.5–5.1)
Sodium: 142 mmol/L (ref 135–145)

## 2019-11-30 LAB — CBC
HCT: 29.6 % — ABNORMAL LOW (ref 39.0–52.0)
Hemoglobin: 9.5 g/dL — ABNORMAL LOW (ref 13.0–17.0)
MCH: 36.7 pg — ABNORMAL HIGH (ref 26.0–34.0)
MCHC: 32.1 g/dL (ref 30.0–36.0)
MCV: 114.3 fL — ABNORMAL HIGH (ref 80.0–100.0)
Platelets: 284 10*3/uL (ref 150–400)
RBC: 2.59 MIL/uL — ABNORMAL LOW (ref 4.22–5.81)
RDW: 14.9 % (ref 11.5–15.5)
WBC: 8.2 10*3/uL (ref 4.0–10.5)
nRBC: 0 % (ref 0.0–0.2)

## 2019-11-30 LAB — BRAIN NATRIURETIC PEPTIDE: B Natriuretic Peptide: 1396.4 pg/mL — ABNORMAL HIGH (ref 0.0–100.0)

## 2019-11-30 NOTE — ED Provider Notes (Signed)
[facility] EMERGENCY DEPARTMENT Provider Note   CSN: [id number] Arrival date & time: 11/30/19  6967     History Chief Complaint  Patient presents with  . Shortness of Breath    David [name] is a 78 y.o. male.  Patient presents to the emergency department with a chief complaint of shortness of breath.  He has a history of CHF, A. fib, ESRD on HD (4 times weekly).  He states that he missed a day this week because of the Christmas holiday.  He states he began feeling short of breath tonight at 1 AM.  He denies any fever, chills, cough.  He is scheduled for dialysis at 6 AM this morning.  Reports some swelling on his feet.  Denies any other associated symptoms.  The history is provided by the patient. No language interpreter was used.       Past Medical History:  Diagnosis Date  . 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 Acute on chronic systolic and diastolic heart failure, NYHA class 4 ([place])   . Anemia, iron deficiency 11/15/2011  . Arthritis    "hands, right knee, feet" (02/21/2018)  . Atrial fibrillation ([place])   . Cancer [facility])    hx of prostate; s/p radioactive seed implant 10/2009 Dr Janice Norrie  . Cardiac arrest ([place]) 02/17/2018  . CHF (congestive heart failure) ([place])   . Chronic combined systolic and diastolic heart failure, NYHA class 3 ([place]) 06/2010   felt to be secondary to hypertensive cardiomyopathy  . Chronic lower back pain   . CKD (chronic kidney disease) stage V requiring chronic dialysis ([place])   . ESRD on dialysis  [facility])    started 02/2018 [place]  . GERD (gastroesophageal reflux disease)   . Glaucoma    "I had surgery and dont have it any more"  . Gout    daily RX (02/21/2018)  . Heart murmur    "mild" per pt  . Hepatitis    years ago  . HIV (human immunodeficiency virus infection) ([place])   . HIV infection ([place])   . Hyperlipidemia   . Hypertension    followed by [facility] and Vascular (Dr Dani Gobble  [place])  . Hypertension   . Nonischemic cardiomyopathy ([place])   . Pneumonia 11/2017  . Prostate cancer ([place])   . Sinus bradycardia   . Sleep apnea    does not use a cpap  . Stroke [facility])    "mini stroke" years ago    Patient Active Problem List   Diagnosis Date Noted  . Acute respiratory failure with hypoxemia ([place]) 11/01/2019  . Hyperkalemia 09/02/2019  . Unspecified open wound of right upper arm, initial encounter 06/21/2019  . Shortness of breath 04/12/2019  . 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 Dependence on renal dialysis ([place]) 03/27/2019  . Hypertensive heart and chronic kidney disease with heart failure and with stage 5 chronic kidney disease, or end stage renal disease ([place]) 03/27/2019  . Mesenteric ischemia ([place])   . Colonic ulcer   . Right lower quadrant pain   . Abnormal CT of the abdomen   . Ischemic colitis ([place])   . Colitis presumed infectious 03/09/2019  . Acute respiratory failure with hypoxia and hypercapnia ([place]) 03/04/2019  . Pruritus, unspecified 02/21/2019  . Acute pulmonary edema ([place]) 02/11/2019  . Pain, unspecified 01/21/2019  . Atrial fibrillation with RVR ([place]) 12/05/2018  . Paroxysmal atrial fibrillation ([place]) 11/16/2018  . LBBB (left bundle branch block) 11/16/2018  . Encounter for monitoring  amiodarone therapy 11/16/2018  . Volume overload 11/01/2018  . Pulmonary edema 10/21/2018  . Gout 10/21/2018  . Hypertensive emergency 10/21/2018  . Chronic combined systolic and diastolic heart failure, NYHA class 3 ([place]) 10/21/2018  . Leukocytosis 10/21/2018  . Chills (without fever) 09/24/2018  . Gastroesophageal reflux disease 08/28/2018  . Chronic anticoagulation 04/03/2018  . CAD (coronary artery disease) 04/03/2018  . Palliative care encounter 03/27/2018  . Atrial fibrillation, chronic ([place]) 03/25/2018  . Anemia in chronic kidney disease 03/12/2018  . Coagulation defect, unspecified ([place]) 03/12/2018  . Secondary hyperparathyroidism of renal  origin ([place]) 03/12/2018  . Palliative care by specialist   . Advance care planning   . Goals of care, counseling/discussion   . Respiratory arrest ([place])   . Acute on chronic systolic heart failure ([place])   . Chronic obstructive pulmonary disease ([place])   . HIV (human immunodeficiency virus infection) ([place])   . ESRD (end stage renal disease) ([place])   . Acute respiratory failure with hypoxia ([place]) 01/21/2018  . Pneumonia   . Aspiration pneumonia of both lower lobes due to regurgitated food ([place])   . Sepsis due to pneumonia ([place]) 11/22/2017  . AIDS ([place]) 05/14/2014  . 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 Gastroparesis 05/06/2014  . Protein-calorie malnutrition, severe ([place]) 05/05/2014  . ESRD on hemodialysis ([place]) 05/02/2014  . Hemodialysis-associated hypotension 05/02/2014  . Leaking of conjunctival drainage bleb 05/01/2014  . POAG (primary open-angle glaucoma) 05/01/2014  . Loss of weight 04/29/2014  . Conjunctivitis 04/29/2014  . 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 Low back pain 04/09/2012  . Osteoarthritis of hip 12/29/2011  . Iron deficiency anemia 06/28/2011  . Macrocytic anemia 04/02/2011  . 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 Hyperlipidemia 11/30/2010  . Transient cerebral ischemia 11/30/2010  . HYPERGLYCEMIA 11/30/2010    Past Surgical History:  Procedure Laterality Date  . AV FISTULA PLACEMENT Right 10/13/2016   Procedure: ARTERIOVENOUS (AV) FISTULA CREATION;  Surgeon: [name], MD;  Location: [facility];  Service: Vascular;  Laterality: Right;  . AV FISTULA PLACEMENT Left 02/25/2018   Procedure: INSERTION OF ARTERIOVENOUS (AV) GORE-TEX GRAFT LEFT UPPER ARM;  Surgeon: Angelia Mould, MD;  Location: [place];  Service: Vascular;  Laterality: Left;  . AV FISTULA PLACEMENT Right 08/07/2018   Procedure: Creation of right arm brachiocephalic Fistula;  Surgeon: Waynetta Sandy, MD;  Location[facility] OR;  Service: Vascular;  Laterality:  Right;  . AV FISTULA PLACEMENT Left 11/10/2019   Procedure: INSERTION OF ARTERIOVENOUS (AV) GORE-TEX GRAFT THIGH;  Surgeon: Elam Dutch, MD;  Location: [place];  Service: Vascular;  Laterality: Left;  . BASCILIC VEIN TRANSPOSITION Left 06/24/2015   Procedure: BASILIC VEIN TRANSPOSITION;  Surgeon: Mal Misty, MD;  Location: [place];  Service: Vascular;  Laterality: Left;  . BIOPSY  03/14/2019   Procedure: BIOPSY;  Surgeon: Irene Shipper, MD;  Location: [place];  Service: Endoscopy;;  . CATARACT EXTRACTION, BILATERAL Bilateral   . COLONOSCOPY WITH PROPOFOL N/A 03/14/2019   Procedure: COLONOSCOPY WITH PROPOFOL;  Surgeon: Irene Shipper, MD;  Location: [facility] ENDOSCOPY;  Service: Endoscopy;  Laterality: N/A;  . ESOPHAGOGASTRODUODENOSCOPY (EGD) WITH PROPOFOL N/A 05/05/2014   Procedure: ESOPHAGOGASTRODUODENOSCOPY (EGD) WITH PROPOFOL;  Surgeon: Missy Sabins, MD;  Location: [facility] ENDOSCOPY;  Service: Endoscopy;  Laterality: N/A;  . EYE SURGERY Bilateral    cataract surgery   . GLAUCOMA SURGERY Bilateral   . INSERTION OF ARTERIOVENOUS (AV) ARTEGRAFT ARM  10/09/2018   Procedure: INSERTION OF  ARTERIOVENOUS (AV) 40mm x 41cm ARTEGRAFT LEFT UPPER ARM;  Surgeon: Waynetta Sandy, MD;  Location: [place];  Service: Vascular;;  . INSERTION OF DIALYSIS CATHETER Right 07/14/2019   Procedure: Insertion Of Dialysis Catheter;  Surgeon: Elam Dutch, MD;  Location: [facility] OR;  Service: Vascular;  Laterality: Right;  placed right Internal Jugular  . INSERTION PROSTATE RADIATION SEED    . IR FLUORO GUIDE CV LINE RIGHT  02/18/2018  . IR REMOVAL TUN CV CATH W/O FL  12/06/2018  . IR US GUIDE VASC ACCESS RIGHT  02/18/2018  . LEFT HEART CATH AND CORONARY ANGIOGRAPHY N/A 02/20/2018   Procedure: LEFT HEART CATH AND CORONARY ANGIOGRAPHY;  Surgeon: Jettie Booze, MD;  Location: [place] CV LAB;;; 50% ostial OM2, 25% mLAD.  Marland Kitchen LEFT HEART CATH AND CORONARY ANGIOGRAPHY  2004   mildly depressed LV systolic fx EF 31%,DVVOHY  coronaries/abdominal aorta/renal arteries.  . LOWER EXTREMITY ANGIOGRAM Left 11/17/2019   Procedure: Lower Extremity Fistulogram;  Surgeon: Marty Heck, MD;  Location: [place];  Service: Vascular;  Laterality: Left;  . NM [place]  02/21/2010   No ischemia or infarction.  EF 27%.  Marland Kitchen RADIOACTIVE SEED IMPLANT  2010   prostate cancer  . REVISION OF ARTERIOVENOUS GORETEX GRAFT Right 07/11/2019   Procedure: REVISION OF ARTERIOVENOUS GORETEX GRAFT RIGHT ARM WITH ARTEGRAFT;  Surgeon: Serafina Mitchell, MD;  Location: [place];  Service: Vascular;  Laterality: Right;  . SHUNTOGRAM Right 07/14/2019   Procedure: Shuntogram;  Surgeon: Elam Dutch, MD;  Location: [place];  Service: Vascular;  Laterality: Right;  . THROMBECTOMY AND REVISION OF ARTERIOVENTOUS (AV) GORETEX  GRAFT Right 07/14/2019   Procedure: THROMBECTOMY AND REVISION OF ARTERIOVENTOUS (AV) GORETEX  GRAFT;  Surgeon: Elam Dutch, MD;  Location: [place];  Service: Vascular;  Laterality: Right;  . THROMBECTOMY W/ EMBOLECTOMY Left 02/26/2018   Procedure: THROMBECTOMY ARTERIOVENOUS GRAFT;  Surgeon: Angelia Mould, MD;  Location: [place];  Service: Vascular;  Laterality: Left;  . THROMBECTOMY W/ EMBOLECTOMY Left 11/17/2019   Procedure: THROMBECTOMY ARTERIOVENOUS GRAFT LEFT THIGH;  Surgeon: Marty Heck, MD;  Location: [place];  Service: Vascular;  Laterality: Left;  . TRANSTHORACIC ECHOCARDIOGRAM  02/2012   [facility]) EF 45-50% with mild global HK.  Mild LVH,LA mod. dilated,mild-mod. 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 AI.  Marland Kitchen TRANSTHORACIC ECHOCARDIOGRAM  9/'18; 3/'19   a) Severely reduced EF.  25 from 30%.  GR 1 DD with diffuse ecchymosis. Mild AS & AI.  Mild LA/RA dilation; b) (post PEA arest):   Moderately dilated LV with moderate concentric virtually.  EF 15 to 20%.  GR 1 DD.  Paradoxical septal motion. Mild AI.  Mild LA dilation.  Poorly visualized RV.  Marland Kitchen TRANSTHORACIC ECHOCARDIOGRAM   02/2019   EF 20-25%.  Unable to assess diastolic function (A. Fib).  No LV apical thrombus.  Mild AI.  Mildly reduced RV function, however poorly visualized.  Marland Kitchen ULTRASOUND GUIDANCE FOR VASCULAR ACCESS  02/20/2018   Procedure: Ultrasound Guidance For Vascular Access;  Surgeon: Jettie Booze, MD;  Location: [place] CV LAB;  Service: Cardiovascular;;  . UPPER EXTREMITY VENOGRAPHY N/A 07/08/2018   Procedure: UPPER EXTREMITY VENOGRAPHY;  Surgeon: Waynetta Sandy, MD;  Location: [place] CV LAB;  Service: Cardiovascular;  Laterality: N/A;  Bilateral       Family History  Problem Relation Age of Onset  . Hypertension Mother   . Thyroid disease Mother   .  Cholelithiasis Daughter   . Cholelithiasis Son   . Hypertension Maternal Grandmother   . Diabetes Maternal Grandmother   . Heart attack Neg Hx   . Hyperlipidemia Neg Hx     Social History   Tobacco Use  . Smoking status: Former Smoker    Packs/day: 1.00    Years: 30.00    Pack years: 30.00    Types: Cigarettes    Quit date: 2006    Years since quitting: 14.9  . Smokeless tobacco: Never Used  Substance Use Topics  . Alcohol use: Yes    Alcohol/week: 1.0 standard drinks    Types: 1 Shots of liquor per week  . Drug use: Never    Home Medications Prior to Admission medications   Medication Sig Start Date End Date Taking? Authorizing Provider  acetaminophen (TYLENOL) 500 MG tablet Take 1,000 mg by mouth every 6 (six) hours as needed for moderate pain.    [provider]  allopurinol (ZYLOPRIM) 100 MG tablet 1 tablet by mouth 3x weekly after dialysis. 09/15/19   [name], NP  amiodarone (PACERONE) 200 MG tablet Take 1 tablet (200 mg total) by mouth daily. 05/12/19   Croitoru, Mihai, MD  bictegravir-emtricitabine-tenofovir AF ([name]) 50-200-25 MG TABS tablet Take 1 tablet by mouth daily. 09/29/19   Truman Hayward, MD  brimonidine (ALPHAGAN) 0.15 % ophthalmic solution Place 1 drop into  the left eye 2 (two) times daily. 11/15/17   [provider]  dorzolamide-timolol (COSOPT) 22.3-6.8 MG/ML ophthalmic solution Place 1 drop into the left eye 2 (two) times daily. 12/17/17   [provider]  ferric citrate (AURYXIA) 1 GM 210 MG(Fe) tablet Take 420 mg by mouth 3 (three) times daily with meals.    [provider]  latanoprost (XALATAN) 0.005 % ophthalmic solution Place 1 drop into the left eye at bedtime.     [provider]  loratadine (CLARITIN) 10 MG tablet Take 10 mg by mouth daily.    [provider]  midodrine (PROAMATINE) 10 MG tablet Take 1 tablet (10 mg total) by mouth as directed. Twice a day on the day of HD Tuesday, Thursday, Saturday (1 in AM and 1 at [place]). Take 1/2 tablet (5mg ) a day on non HD days Patient taking differently: Take 10 mg by mouth See admin instructions. Take 1 tablet (10 mg) by mouth 3 times daily on Tuesdays, Thursdays, & Saturdays. 04/22/19   Leonie Man, MD  montelukast (SINGULAIR) 10 MG tablet Take 1 tablet (10 mg total) by mouth at bedtime. 09/12/19   [name], NP  multivitamin (RENA-VIT) TABS tablet Take 1 tablet by mouth daily at 2 PM.     [provider]  traMADol (ULTRAM) 50 MG tablet Take 1 tablet (50 mg total) by mouth every 6 (six) hours as needed. 11/17/19   Ulyses Amor, PA-C  VELPHORO 500 MG chewable tablet Chew 500 mg by mouth 3 (three) times daily with meals.  09/29/19   [provider]  warfarin (COUMADIN) 5 MG tablet TAKE 1/2 TO 1 TABLET BY MOUTH DAILY AS DIRECTED BY COUMADIN CLINIC Patient taking differently: Take 2.5-5 mg by mouth See admin instructions. Take 1/2 to 1 tablet by mouth daily as directed by coumadin clinic; 2.5 mg on Mon and Fri, 5 ,g all other days 11/04/19   Leonie Man, MD    Allergies    Dextromethorphan-guaifenesin, Tocotrienols, and Losartan potassium  Review of Systems   Review of Systems  All other  systems reviewed and are  negative.   Physical Exam Updated Vital Signs SpO2 100% Comment: 2L of O2.  Physical Exam Vitals and nursing note reviewed.  Constitutional:      Appearance: He is well-developed.  HENT:     Head: Normocephalic and atraumatic.  Eyes:     Conjunctiva/sclera: Conjunctivae normal.  Cardiovascular:     Rate and Rhythm: Normal rate and regular rhythm.     Heart sounds: No murmur.  Pulmonary:     Effort: Pulmonary effort is normal. No respiratory distress.     Breath sounds: Rales present.  Abdominal:     Palpations: Abdomen is soft.     Tenderness: There is no abdominal tenderness.  Musculoskeletal:        General: Normal range of motion.     Cervical back: Neck supple.     Comments: No significant edema in BLE  Skin:    General: Skin is warm and dry.  Neurological:     Mental Status: He is alert and oriented to person, place, and time.  Psychiatric:        Mood and Affect: Mood normal.        Behavior: Behavior normal.     ED Results / Procedures / Treatments   Labs (all labs ordered are listed, but only abnormal results are displayed) Labs Reviewed - No data to display  EKG None  Radiology No results found.  Procedures Procedures (including critical care time)  Medications Ordered in ED Medications - No data to display  ED Course  I have reviewed the triage vital signs and the nursing notes.  Pertinent labs & imaging results that were available during my care of the patient were reviewed by me and considered in my medical decision making (see chart for details).    MDM Rules/Calculators/A&P                      Patient with shortness of breath that started at 1 AM tonight.  He missed a round of dialysis.  He has dialysis this morning at 6 AM.  He is not hypoxic on room air, but was given 2 L nasal cannula for comfort.  He is not extremely hypertensive.  He is not tachycardic or tachypneic.  His potassium is 5.4.  Chest x-ray shows interstitial edema, but  no evidence of emergent fluid overload.  We will discharge, patient will go straight to dialysis   Final Clinical Impression(s) / ED Diagnoses Final diagnoses:  SOB (shortness of breath)    Rx / DC Orders ED Discharge Orders    None       [name], PA-C 11/30/19 0457    Veryl Speak, MD 11/30/19 2300

## 2019-11-30 NOTE — ED Triage Notes (Signed)
Pt BIB [facility] from home with c/o [facility] and fluid overload. Pt currently gets dialysis 4 days a week and has a history of CHF. Due to the holiday pt had to skip a day. Pt was scheduled for dialysis this morning but felt like he could not wait for dialysis. Pt stated his SHOB started around 1am.

## 2019-12-01 ENCOUNTER — Encounter (HOSPITAL_COMMUNITY): Payer: Self-pay | Admitting: Vascular Surgery

## 2019-12-01 ENCOUNTER — Other Ambulatory Visit: Payer: Self-pay

## 2019-12-01 DIAGNOSIS — E8770 Fluid overload, unspecified: Secondary | ICD-10-CM | POA: Diagnosis not present

## 2019-12-01 DIAGNOSIS — N2581 Secondary hyperparathyroidism of renal origin: Secondary | ICD-10-CM | POA: Diagnosis not present

## 2019-12-01 DIAGNOSIS — Z992 Dependence on renal dialysis: Secondary | ICD-10-CM | POA: Diagnosis not present

## 2019-12-01 DIAGNOSIS — N186 End stage renal disease: Secondary | ICD-10-CM | POA: Diagnosis not present

## 2019-12-01 NOTE — Progress Notes (Signed)
Anesthesia Chart Review: Same day workup  Follows with cardiology for combined HF, NICM, CAD (minimal by cath), Afib on warfarin, LBBB. EF 20-25% by echo 3/20. Last seen by Dr,. Coitoru 06/13/19, stable, no changes to management.   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  ED visit 11/30/19 for SOB secondary to missed dialysis and fluid overload. He was felt to be stable enough to discharge for outpatient dialysis done same day.   Will need DOS labs and eval.   EKG 11/01/19: Sinus rhythm. Rate 98. Borderline prolonged PR interval. Probable left atrial enlargement. Left bundle branch block. No significant change since last tracing  Limited Echo 03/04/19:  1. The left ventricle has severely reduced systolic function, with an ejection fraction of 20-25%. The cavity size was moderately dilated. Left ventricular diastolic function could not be evaluated. Left ventricular diffuse hypokinesis.  2. No LV apical thrombus noted with Definity contrast enhancement.  3. The aortic valve was not well visualized. Aortic valve regurgitation is mild by color flow Doppler.   Wynonia Musty [facility] Short Stay Center/Anesthesiology Phone [phone] 12/01/2019 2:47 PM

## 2019-12-01 NOTE — Patient Outreach (Addendum)
[facility]) Care Management  12/01/2019  [place] [birth year] [phone]   Telephone Assessment    Outreach attempt #1 to patient. Spoke briefly with patient as he reports that he is currently at dialysis center. He shares that he went to the ED over the weekend as he was experiencing some SOB. He reports that due to holiday he was not able to get one of his dialysis treatments for the week and feels that contributed to his symptoms. He reports that he has no further SOB. He is "on dialysis machine now" and tolerating it well. Patient reports that he is scheduled to go to tomorrow to have permanent dialysis access placed. He denies any RN CM needs or concerns at this time. RN CM unable to review meds as patient at dialysis treatment.    THN CM Care Plan Problem One     Most Recent Value  Care Plan Problem One  Knowledge deficit related to ESRD diasease process and mgmt.  Role Documenting the Problem One  Care Management Telephonic Coordinator  Care Plan for Problem One  Active  [facility] Long Term Goal   Patient will report 100% adherence to dialysis schedule and treatments over the next 90 days.   THN Long Term Goal Start Date  09/10/19  Interventions for Problem One Long Term Goal  RN CM reinforced education re: dialysis schedule and importance. RN CM assessed for any barriers to receiving treatment.    THN CM Care Plan Problem Two     Most Recent Value  Care Plan Problem Two  Patient needs permanent dialysis access for txs.  Role Documenting the Problem Two  Care Management Telephonic [place] for Problem Two  Active  Interventions for Problem Two Long Term Goal   RN CM confirmed with pt. procedure schedule  THN Long Term Goal  Patient will report having permanent dialysis access placed within the next 90 days.  THN Long Term Goal Start Date  10/01/19  [facility] CM Short Term Goal #1   Patient will report no complications/issues regarding dialysis  catheter placement over the next 30 days.  Interventions for Short Term Goal #2   RN CM discussed access palcement, s/s of infection, worsening condition and when to seek medical attention. RN CM confirmed pt. scheduled for placement.       Plan: RN CM discussed with patient next outreach within the month of January. Patient gave verbal consent and in agreement with RN CM follow up and timeframe. Patient aware that they may contact RN CM sooner for any issues or concerns.  Enzo Montgomery, RN,BSN,CCM [facility] Management Telephonic Care Management Coordinator Direct Phone: [phone] Toll Free: [phone] Fax: [phone]

## 2019-12-01 NOTE — Progress Notes (Signed)
Pt denies SOB and chest pain. Pt stated that he went to dialysis today. Pt stated that he is under the care of [name], Cardiology. Pt stated that last dose of Coumadin was Thursday as instructed. Pt made aware to stop taking  vitamins, fish oil and herbal medications. Do not take any NSAIDs ie: Ibuprofen, Advil, Naproxen (Aleve), Motrin, BC and Goody Powder. Pt verbalized understanding of all pre-op instructions. PA, Anesthesiology, asked to review pt history of recent ED visit.

## 2019-12-01 NOTE — Anesthesia Preprocedure Evaluation (Deleted)
Anesthesia Evaluation    Airway        Dental   Pulmonary former smoker,           Cardiovascular hypertension,      Neuro/Psych    GI/Hepatic   Endo/Other    Renal/GU      Musculoskeletal   Abdominal   Peds  Hematology   Anesthesia Other Findings   Reproductive/Obstetrics                             Anesthesia Physical Anesthesia Plan  ASA:   Anesthesia Plan:    Post-op Pain Management:    Induction:   PONV Risk Score and Plan:   Airway Management Planned:   Additional Equipment:   Intra-op Plan:   Post-operative Plan:   Informed Consent:   Plan Discussed with:   Anesthesia Plan Comments: (Follows with cardiology for combined HF, NICM, CAD (minimal by cath), Afib on warfarin, LBBB. EF 20-25% by echo 3/20. Last seen by Dr,. Coitoru 06/13/19, stable, no changes to management.   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  ED visit 11/30/19 for SOB secondary to missed dialysis and fluid overload. He was felt to be stable enough to discharge for outpatient dialysis done same day.   Will need DOS labs and eval.   EKG 11/01/19: Sinus rhythm. Rate 98. Borderline prolonged PR interval. Probable left atrial enlargement. Left bundle branch block. No significant change since last tracing  Limited Echo 03/04/19:  1. The left ventricle has severely reduced systolic function, with an ejection fraction of 20-25%. The cavity size was moderately dilated. Left ventricular diastolic function could not be evaluated. Left ventricular diffuse hypokinesis.  2. No LV apical thrombus noted with Definity contrast enhancement.  3. The aortic valve was not well visualized. Aortic valve regurgitation is mild by color flow Doppler. )        Anesthesia Quick Evaluation

## 2019-12-02 ENCOUNTER — Ambulatory Visit (HOSPITAL_COMMUNITY)
Admission: RE | Admit: 2019-12-02 | Discharge: 2019-12-02 | Disposition: A | Discharge status: Home / Self Care | Payer: Medicare HMO | Attending: Vascular Surgery | Admitting: Vascular Surgery

## 2019-12-02 ENCOUNTER — Ambulatory Visit (HOSPITAL_COMMUNITY): Payer: Medicare HMO | Admitting: Physician Assistant

## 2019-12-02 ENCOUNTER — Ambulatory Visit (INDEPENDENT_AMBULATORY_CARE_PROVIDER_SITE_OTHER): Payer: Medicare HMO | Admitting: Pharmacist Clinician (PhC)/ Clinical Pharmacy Specialist

## 2019-12-02 ENCOUNTER — Other Ambulatory Visit: Payer: Self-pay

## 2019-12-02 ENCOUNTER — Encounter (HOSPITAL_COMMUNITY): Payer: Self-pay | Admitting: Vascular Surgery

## 2019-12-02 ENCOUNTER — Encounter (HOSPITAL_COMMUNITY)
Admission: RE | Disposition: A | Discharge status: Home / Self Care | Payer: Self-pay | Source: Home / Self Care | Attending: Vascular Surgery

## 2019-12-02 DIAGNOSIS — I2510 Atherosclerotic heart disease of native coronary artery without angina pectoris: Secondary | ICD-10-CM | POA: Diagnosis not present

## 2019-12-02 DIAGNOSIS — Z87891 Personal history of nicotine dependence: Secondary | ICD-10-CM | POA: Insufficient documentation

## 2019-12-02 DIAGNOSIS — I5042 Chronic combined systolic (congestive) and diastolic (congestive) heart failure: Secondary | ICD-10-CM | POA: Insufficient documentation

## 2019-12-02 DIAGNOSIS — E785 Hyperlipidemia, unspecified: Secondary | ICD-10-CM | POA: Diagnosis not present

## 2019-12-02 DIAGNOSIS — N186 End stage renal disease: Secondary | ICD-10-CM | POA: Insufficient documentation

## 2019-12-02 DIAGNOSIS — Z992 Dependence on renal dialysis: Secondary | ICD-10-CM | POA: Diagnosis not present

## 2019-12-02 DIAGNOSIS — I132 Hypertensive heart and chronic kidney disease with heart failure and with stage 5 chronic kidney disease, or end stage renal disease: Secondary | ICD-10-CM | POA: Diagnosis not present

## 2019-12-02 DIAGNOSIS — B20 Human immunodeficiency virus [HIV] disease: Secondary | ICD-10-CM | POA: Diagnosis not present

## 2019-12-02 DIAGNOSIS — Z7901 Long term (current) use of anticoagulants: Secondary | ICD-10-CM | POA: Insufficient documentation

## 2019-12-02 DIAGNOSIS — K219 Gastro-esophageal reflux disease without esophagitis: Secondary | ICD-10-CM | POA: Insufficient documentation

## 2019-12-02 DIAGNOSIS — I4891 Unspecified atrial fibrillation: Secondary | ICD-10-CM | POA: Insufficient documentation

## 2019-12-02 DIAGNOSIS — I482 Chronic atrial fibrillation, unspecified: Secondary | ICD-10-CM

## 2019-12-02 DIAGNOSIS — Z79899 Other long term (current) drug therapy: Secondary | ICD-10-CM | POA: Diagnosis not present

## 2019-12-02 DIAGNOSIS — Z20828 Contact with and (suspected) exposure to other viral communicable diseases: Secondary | ICD-10-CM | POA: Diagnosis not present

## 2019-12-02 DIAGNOSIS — I120 Hypertensive chronic kidney disease with stage 5 chronic kidney disease or end stage renal disease: Secondary | ICD-10-CM | POA: Diagnosis not present

## 2019-12-02 DIAGNOSIS — Z515 Encounter for palliative care: Secondary | ICD-10-CM

## 2019-12-02 HISTORY — DX: Presence of spectacles and contact lenses: Z97.3

## 2019-12-02 HISTORY — PX: AV FISTULA PLACEMENT: SHX1204

## 2019-12-02 LAB — POCT I-STAT, CHEM 8
BUN: 27 mg/dL — ABNORMAL HIGH (ref 8–23)
Calcium, Ion: 1.13 mmol/L — ABNORMAL LOW (ref 1.15–1.40)
Chloride: 102 mmol/L (ref 98–111)
Creatinine, Ser: 6.6 mg/dL — ABNORMAL HIGH (ref 0.61–1.24)
Glucose, Bld: 100 mg/dL — ABNORMAL HIGH (ref 70–99)
HCT: 34 % — ABNORMAL LOW (ref 39.0–52.0)
Hemoglobin: 11.6 g/dL — ABNORMAL LOW (ref 13.0–17.0)
Potassium: 4.7 mmol/L (ref 3.5–5.1)
Sodium: 139 mmol/L (ref 135–145)
TCO2: 28 mmol/L (ref 22–32)

## 2019-12-02 LAB — PROTIME-INR
INR: 1.2 (ref 0.8–1.2)
Prothrombin Time: 15.3 seconds — ABNORMAL HIGH (ref 11.4–15.2)

## 2019-12-02 SURGERY — INSERTION OF ARTERIOVENOUS (AV) GORE-TEX GRAFT THIGH
Anesthesia: General | Site: Thigh | Laterality: Right

## 2019-12-02 MED ORDER — PHENYLEPHRINE HCL-NACL 10-0.9 MG/250ML-% IV SOLN
INTRAVENOUS | Status: DC | PRN
  Administered 2019-12-02: 30 ug/min via INTRAVENOUS

## 2019-12-02 MED ORDER — PROPOFOL 10 MG/ML IV BOLUS
INTRAVENOUS | Status: AC
  Filled 2019-12-02: qty 20

## 2019-12-02 MED ORDER — FENTANYL CITRATE (PF) 100 MCG/2ML IJ SOLN: 25.0000 ug | INTRAMUSCULAR | Status: DC | PRN

## 2019-12-02 MED ORDER — LIDOCAINE HCL (PF) 1 % IJ SOLN
INTRAMUSCULAR | Status: AC
  Filled 2019-12-02: qty 30

## 2019-12-02 MED ORDER — ONDANSETRON HCL 4 MG/2ML IJ SOLN
INTRAMUSCULAR | Status: AC
  Filled 2019-12-02: qty 2

## 2019-12-02 MED ORDER — CHLORHEXIDINE GLUCONATE 4 % EX LIQD: 60.0000 mL | Freq: Once | CUTANEOUS | Status: DC

## 2019-12-02 MED ORDER — PROPOFOL 10 MG/ML IV BOLUS
INTRAVENOUS | Status: DC | PRN
  Administered 2019-12-02: 100 mg via INTRAVENOUS

## 2019-12-02 MED ORDER — LIDOCAINE-EPINEPHRINE 0.5 %-1:200000 IJ SOLN
INTRAMUSCULAR | Status: AC
  Filled 2019-12-02: qty 1

## 2019-12-02 MED ORDER — CELECOXIB 200 MG PO CAPS
ORAL_CAPSULE | ORAL | Status: AC
  Filled 2019-12-02: qty 2

## 2019-12-02 MED ORDER — LIDOCAINE 2% (20 MG/ML) 5 ML SYRINGE
INTRAMUSCULAR | Status: AC
  Filled 2019-12-02: qty 5

## 2019-12-02 MED ORDER — HEMOSTATIC AGENTS (NO CHARGE) OPTIME
TOPICAL | Status: DC | PRN
  Administered 2019-12-02: 1 via TOPICAL

## 2019-12-02 MED ORDER — 0.9 % SODIUM CHLORIDE (POUR BTL) OPTIME
TOPICAL | Status: DC | PRN
  Administered 2019-12-02: 1000 mL

## 2019-12-02 MED ORDER — PHENYLEPHRINE 40 MCG/ML (10ML) SYRINGE FOR IV PUSH (FOR BLOOD PRESSURE SUPPORT)
PREFILLED_SYRINGE | INTRAVENOUS | Status: AC
  Filled 2019-12-02: qty 10

## 2019-12-02 MED ORDER — SODIUM CHLORIDE 0.9 % IV SOLN
INTRAVENOUS | Status: AC
  Filled 2019-12-02: qty 1.2

## 2019-12-02 MED ORDER — CELECOXIB 200 MG PO CAPS: 400.0000 mg | ORAL_CAPSULE | Freq: Once | ORAL | Status: DC

## 2019-12-02 MED ORDER — PHENYLEPHRINE 40 MCG/ML (10ML) SYRINGE FOR IV PUSH (FOR BLOOD PRESSURE SUPPORT)
PREFILLED_SYRINGE | INTRAVENOUS | Status: DC | PRN
  Administered 2019-12-02: 80 ug via INTRAVENOUS

## 2019-12-02 MED ORDER — LIDOCAINE 2% (20 MG/ML) 5 ML SYRINGE
INTRAMUSCULAR | Status: DC | PRN
  Administered 2019-12-02: 100 mg via INTRAVENOUS

## 2019-12-02 MED ORDER — LIDOCAINE-EPINEPHRINE 1 %-1:100000 IJ SOLN
INTRAMUSCULAR | Status: AC
  Filled 2019-12-02: qty 1

## 2019-12-02 MED ORDER — SODIUM CHLORIDE 0.9 % IV SOLN
INTRAVENOUS | Status: DC | PRN
  Administered 2019-12-02: 500 mL

## 2019-12-02 MED ORDER — ACETAMINOPHEN 500 MG PO TABS: 1000.0000 mg | ORAL_TABLET | Freq: Once | ORAL | Status: DC

## 2019-12-02 MED ORDER — SODIUM CHLORIDE 0.9 % IV SOLN: INTRAVENOUS | Status: DC

## 2019-12-02 MED ORDER — FENTANYL CITRATE (PF) 100 MCG/2ML IJ SOLN
INTRAMUSCULAR | Status: DC | PRN
  Administered 2019-12-02: 100 ug via INTRAVENOUS
  Administered 2019-12-02: 50 ug via INTRAVENOUS

## 2019-12-02 MED ORDER — CEFAZOLIN SODIUM-DEXTROSE 2-4 GM/100ML-% IV SOLN
INTRAVENOUS | Status: AC
  Filled 2019-12-02: qty 100

## 2019-12-02 MED ORDER — EPHEDRINE SULFATE-NACL 50-0.9 MG/10ML-% IV SOSY
PREFILLED_SYRINGE | INTRAVENOUS | Status: DC | PRN
  Administered 2019-12-02 (×3): 10 mg via INTRAVENOUS
  Administered 2019-12-02: 5 mg via INTRAVENOUS

## 2019-12-02 MED ORDER — TRAMADOL HCL 50 MG PO TABS: 50.0000 mg | ORAL_TABLET | Freq: Four times a day (QID) | ORAL | 0 refills | Status: DC | PRN

## 2019-12-02 MED ORDER — PROMETHAZINE HCL 25 MG/ML IJ SOLN: 6.2500 mg | INTRAMUSCULAR | Status: DC | PRN

## 2019-12-02 MED ORDER — ACETAMINOPHEN 500 MG PO TABS
ORAL_TABLET | ORAL | Status: AC
  Filled 2019-12-02: qty 2

## 2019-12-02 MED ORDER — FENTANYL CITRATE (PF) 250 MCG/5ML IJ SOLN
INTRAMUSCULAR | Status: AC
  Filled 2019-12-02: qty 5

## 2019-12-02 MED ORDER — CEFAZOLIN SODIUM-DEXTROSE 2-4 GM/100ML-% IV SOLN
2.0000 g | INTRAVENOUS | Status: AC
  Administered 2019-12-02: 2 g via INTRAVENOUS

## 2019-12-02 MED ORDER — ONDANSETRON HCL 4 MG/2ML IJ SOLN
INTRAMUSCULAR | Status: DC | PRN
  Administered 2019-12-02: 4 mg via INTRAVENOUS

## 2019-12-02 MED ORDER — EPHEDRINE 5 MG/ML INJ
INTRAVENOUS | Status: AC
  Filled 2019-12-02: qty 10

## 2019-12-02 MED ORDER — HEPARIN SODIUM (PORCINE) 1000 UNIT/ML IJ SOLN
INTRAMUSCULAR | Status: DC | PRN
  Administered 2019-12-02: 3000 [IU] via INTRAVENOUS

## 2019-12-02 SURGICAL SUPPLY — 36 items
ADH SKN CLS APL DERMABOND .7 (GAUZE/BANDAGES/DRESSINGS) ×1
BLADE CLIPPER SURG (BLADE) ×2 IMPLANT
CANISTER SUCT 3000ML PPV (MISCELLANEOUS) ×3 IMPLANT
CLIP VESOCCLUDE MED 6/CT (CLIP) ×3 IMPLANT
CLIP VESOCCLUDE SM WIDE 6/CT (CLIP) ×3 IMPLANT
COVER WAND RF STERILE (DRAPES) ×3 IMPLANT
DERMABOND ADVANCED (GAUZE/BANDAGES/DRESSINGS) ×2
DERMABOND ADVANCED .7 DNX12 (GAUZE/BANDAGES/DRESSINGS) ×1 IMPLANT
DRAPE HALF SHEET 40X57 (DRAPES) ×2 IMPLANT
DRAPE INCISE IOBAN 66X45 STRL (DRAPES) ×5 IMPLANT
ELECT REM PT RETURN 9FT ADLT (ELECTROSURGICAL) ×3
ELECTRODE REM PT RTRN 9FT ADLT (ELECTROSURGICAL) ×1 IMPLANT
GLOVE BIO SURGEON STRL SZ7.5 (GLOVE) ×5 IMPLANT
GLOVE ECLIPSE 7.0 STRL STRAW (GLOVE) ×2 IMPLANT
GLOVE INDICATOR 7.5 STRL GRN (GLOVE) ×4 IMPLANT
GOWN STRL REUS W/ TWL LRG LVL3 (GOWN DISPOSABLE) ×2 IMPLANT
GOWN STRL REUS W/ TWL XL LVL3 (GOWN DISPOSABLE) ×1 IMPLANT
GOWN STRL REUS W/TWL LRG LVL3 (GOWN DISPOSABLE) ×6
GOWN STRL REUS W/TWL XL LVL3 (GOWN DISPOSABLE) ×3
GRAFT GORETEX STRT 4-7X45 (Vascular Products) ×2 IMPLANT
HEMOSTAT SNOW SURGICEL 2X4 (HEMOSTASIS) IMPLANT
INSERT FOGARTY SM (MISCELLANEOUS) ×3 IMPLANT
KIT BASIN OR (CUSTOM PROCEDURE TRAY) ×3 IMPLANT
KIT TURNOVER KIT B (KITS) ×3 IMPLANT
LOOP VESSEL MINI RED (MISCELLANEOUS) ×2 IMPLANT
NS IRRIG 1000ML POUR BTL (IV SOLUTION) ×3 IMPLANT
PACK CV ACCESS (CUSTOM PROCEDURE TRAY) ×3 IMPLANT
PAD ARMBOARD 7.5X6 YLW CONV (MISCELLANEOUS) ×6 IMPLANT
SPONGE LAP 18X18 RF (DISPOSABLE) ×2 IMPLANT
SUT MNCRL AB 4-0 PS2 18 (SUTURE) IMPLANT
SUT PROLENE 6 0 BV (SUTURE) ×8 IMPLANT
SUT VIC AB 3-0 SH 27 (SUTURE) ×6
SUT VIC AB 3-0 SH 27X BRD (SUTURE) ×2 IMPLANT
TOWEL GREEN STERILE (TOWEL DISPOSABLE) ×3 IMPLANT
UNDERPAD 30X30 (UNDERPADS AND DIAPERS) ×3 IMPLANT
WATER STERILE IRR 1000ML POUR (IV SOLUTION) ×3 IMPLANT

## 2019-12-02 NOTE — H&P (Signed)
   History and Physical Update  The patient was interviewed and re-examined.  The patient's previous History and Physical has been reviewed and is unchanged from recent office visit. Plan is for right thigh av graft today in OR.   Javier Mamone C. Donzetta Matters, MD [facility] Office: [phone] Pager: [phone]   12/02/2019, 12:08 PM '

## 2019-12-02 NOTE — Transfer of Care (Signed)
Immediate Anesthesia Transfer of Care Note  Patient: [facility]  Procedure(s) Performed: INSERTION OF ARTERIOVENOUS (AV) GORE-TEX GRAFT RIGHT  THIGH (Right Thigh)  Patient Location: PACU  Anesthesia Type:General  Level of Consciousness: awake, alert  and oriented  Airway & Oxygen Therapy: Patient Spontanous Breathing and Patient connected to nasal cannula oxygen  Post-op Assessment: Report given to RN and Post -op Vital signs reviewed and stable  Post vital signs: Reviewed and stable  Last Vitals:  Vitals Value Taken Time  BP 158/78 12/02/19 1347  Temp    Pulse    Resp 20 12/02/19 1348  SpO2    Vitals shown include unvalidated device data.  Last Pain:  Vitals:   12/02/19 0848  TempSrc:   PainSc: 0-No pain      Patients Stated Pain Goal: 3 (97/98/92 1194)  Complications: No apparent anesthesia complications

## 2019-12-02 NOTE — Anesthesia Preprocedure Evaluation (Addendum)
Anesthesia Evaluation  Patient identified by MRN, date of birth, ID band Patient awake    Reviewed: Allergy & Precautions, NPO status , Patient's Chart, lab work & pertinent test results, reviewed documented beta blocker date and time   History of Anesthesia Complications Negative for: history of anesthetic complications  Airway Mallampati: II  TM Distance: >3 FB Neck ROM: Full    Dental  (+) Poor Dentition, Dental Advisory Given   Pulmonary sleep apnea (does not use CPAP) , COPD,  COPD inhaler, former smoker,  11/17/2019 SARS CoV2 NEG   Pulmonary exam normal        Cardiovascular hypertension, Pt. on medications and Pt. on home beta blockers (-) angina+ CAD  Normal cardiovascular exam+ dysrhythmias Atrial Fibrillation   Follows with cardiology for combined HF, NICM, CAD (minimal by cath), Afib on warfarin, LBBB. EF 20-25% by echo 3/20. Last seen by Dr,. Coitoru 06/13/19, stable, no changes to management.   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  ED visit 11/30/19 for SOB secondary to missed dialysis and fluid overload. He was felt to be stable enough to discharge for outpatient dialysis done same day.   Will need DOS labs and eval.   EKG 11/01/19: Sinus rhythm. Rate 98. Borderline prolonged PR interval. Probable left atrial enlargement. Left bundle branch block. No significant change since last tracing  Limited Echo 03/04/19:  1. The left ventricle has severely reduced systolic function, with an ejection fraction of 20-25%. The cavity size was moderately dilated. Left ventricular diastolic function could not be evaluated. Left ventricular diffuse hypokinesis.  2. No LV apical thrombus noted with Definity contrast enhancement.  3. The aortic valve was not well visualized. Aortic valve regurgitation is mild by color flow Doppler.     Neuro/Psych CVA, No Residual Symptoms    GI/Hepatic negative GI  ROS, (+) Hepatitis -  Endo/Other  negative endocrine ROS  Renal/GU Dialysis and ESRFRenal disease ([facility])   H/o prostate cancer    Musculoskeletal   Abdominal   Peds  Hematology  (+) HIV, INR 1.6 Coumadin   Anesthesia Other Findings   Reproductive/Obstetrics                            Anesthesia Physical  Anesthesia Plan  ASA: III  Anesthesia Plan: General   Post-op Pain Management:    Induction: Intravenous  PONV Risk Score and Plan: 2 and Ondansetron, Dexamethasone and Treatment may vary due to age or medical condition  Airway Management Planned: LMA and Mask  Additional Equipment:   Intra-op Plan:   Post-operative Plan: Extubation in OR  Informed Consent: I have reviewed the patients History and Physical, chart, labs and discussed the procedure including the risks, benefits and alternatives for the proposed anesthesia with the patient or authorized representative who has indicated his/her understanding and acceptance.     Dental advisory given  Plan Discussed with: CRNA, Anesthesiologist and Surgeon  Anesthesia Plan Comments:        Anesthesia Quick Evaluation

## 2019-12-02 NOTE — Op Note (Signed)
    Patient name: David [name] MRN: [id number] DOB: 21-Jan-1941 Sex: male  12/02/2019 Pre-operative Diagnosis: esrd Post-operative diagnosis:  Same Surgeon:  Erlene Quan C. Donzetta Matters, MD Assistant: Laurence Slate, PA Procedure Performed:   Right SFA to common femoral vein arteriovenous loop graft with 4-7 mm stretch PTFE  Indications: 78 year old male with previous upper extremity and left femoral loop AV graft.  These of all failed.  He is currently on dialysis via catheter.  He is now indicated for right femoral AV loop graft.  Findings: Common femoral artery and SFA were patent and were soft.  Graft originates from proximal SFA.  The femoral vein was without disease.  At completion there was a strong thrill in the graft and a palpable popliteal pulse at the knee.   Procedure:  The patient was identified in the holding area and taken to the operating room where LMA anesthesia was induced.  He was sterilely prepped and draped in the right groin usual fashion antibiotics were minister and timeout called.  Transverse incision was made above the groin crease.  We dissected down first dissected out the common femoral artery and SFA.  We then dissected out the common femoral vein.  A 4-7 mm loop graft was tunneled with a counterincision and arterial limb laterally venous limb medially.  3000 units of heparin was administered.  We placed a side-biting clamp on the vein and opened longitudinally.  The graft was trimmed to size and sewn into side with 6-0 Prolene suture.  After completion anastomosis we then flushed through the graft.  The graft was now clamped and the counterincision.  We trimmed the arterial limb to size.  The SFA was clamped proximally distally and opened longitudinally.  This was free of disease.  The graft was sewn end-to-side with 6-0 Prolene suture.  Prior completion without flushing all direction.  Upon completion there was a very strong thrill in the graft.  There was strong Doppler  signal in the renal vein at was absent with compression of the graft.  Patient had good signals in his foot.  We obtain hemostasis in the wound and irrigated the wound copiously.  Incisions were closed with Vicryl and Monocryl.  Dermabond was placed at the level of the skin.  He was awake from anesthesia transferred to the recovery room in stable condition.  He tolerated procedure without immediate complication.  Counts correct at completion.  EBL: 20 cc   Yasuko Lapage C. Donzetta Matters, MD [facility] Office: [phone] Pager: [phone]

## 2019-12-02 NOTE — Anesthesia Postprocedure Evaluation (Signed)
Anesthesia Post Note  Patient: [facility]  Procedure(s) Performed: INSERTION OF ARTERIOVENOUS (AV) GORE-TEX GRAFT RIGHT  THIGH (Right Thigh)     Patient location during evaluation: PACU Anesthesia Type: General Level of consciousness: sedated Pain management: pain level controlled Vital Signs Assessment: post-procedure vital signs reviewed and stable Respiratory status: spontaneous breathing and respiratory function stable Cardiovascular status: stable Postop Assessment: no apparent nausea or vomiting Anesthetic complications: no    Last Vitals:  Vitals:   12/02/19 1417 12/02/19 1432  BP: (!) 146/72 (!) 146/69  Pulse: 85 81  Resp: (!) 21 15  Temp:  36.4 C  SpO2: 96% 99%    Last Pain:  Vitals:   12/02/19 1432  TempSrc:   PainSc: 0-No pain                 Deshia Vanderhoof DANIEL

## 2019-12-02 NOTE — Anesthesia Procedure Notes (Signed)
Procedure Name: LMA Insertion Date/Time: 12/02/2019 12:30 PM Performed by: Trinna Post., CRNA Pre-anesthesia Checklist: Patient identified, Emergency Drugs available, Suction available, Patient being monitored and Timeout performed Patient Re-evaluated:Patient Re-evaluated prior to induction Oxygen Delivery Method: Circle system utilized Preoxygenation: Pre-oxygenation with 100% oxygen Induction Type: IV induction LMA: LMA inserted LMA Size: 4.0 Number of attempts: 1 Placement Confirmation: positive ETCO2 and breath sounds checked- equal and bilateral Tube secured with: Tape Dental Injury: Teeth and Oropharynx as per pre-operative assessment

## 2019-12-03 LAB — GLUCOSE, CAPILLARY: Glucose-Capillary: 95 mg/dL (ref 70–99)

## 2019-12-04 DIAGNOSIS — Z992 Dependence on renal dialysis: Secondary | ICD-10-CM | POA: Diagnosis not present

## 2019-12-04 DIAGNOSIS — N186 End stage renal disease: Secondary | ICD-10-CM | POA: Diagnosis not present

## 2019-12-04 DIAGNOSIS — N2581 Secondary hyperparathyroidism of renal origin: Secondary | ICD-10-CM | POA: Diagnosis not present

## 2019-12-04 DIAGNOSIS — B20 Human immunodeficiency virus [HIV] disease: Secondary | ICD-10-CM | POA: Diagnosis not present

## 2019-12-04 DIAGNOSIS — E8770 Fluid overload, unspecified: Secondary | ICD-10-CM | POA: Diagnosis not present

## 2019-12-05 DIAGNOSIS — U071 COVID-19: Secondary | ICD-10-CM

## 2019-12-05 HISTORY — DX: COVID-19: U07.1

## 2019-12-06 DIAGNOSIS — N186 End stage renal disease: Secondary | ICD-10-CM | POA: Diagnosis not present

## 2019-12-06 DIAGNOSIS — E8770 Fluid overload, unspecified: Secondary | ICD-10-CM | POA: Diagnosis not present

## 2019-12-06 DIAGNOSIS — N2581 Secondary hyperparathyroidism of renal origin: Secondary | ICD-10-CM | POA: Diagnosis not present

## 2019-12-06 DIAGNOSIS — Z992 Dependence on renal dialysis: Secondary | ICD-10-CM | POA: Diagnosis not present

## 2019-12-08 DIAGNOSIS — E8770 Fluid overload, unspecified: Secondary | ICD-10-CM | POA: Diagnosis not present

## 2019-12-08 DIAGNOSIS — N186 End stage renal disease: Secondary | ICD-10-CM | POA: Diagnosis not present

## 2019-12-08 DIAGNOSIS — Z992 Dependence on renal dialysis: Secondary | ICD-10-CM | POA: Diagnosis not present

## 2019-12-08 DIAGNOSIS — N2581 Secondary hyperparathyroidism of renal origin: Secondary | ICD-10-CM | POA: Diagnosis not present

## 2019-12-09 DIAGNOSIS — Z992 Dependence on renal dialysis: Secondary | ICD-10-CM | POA: Diagnosis not present

## 2019-12-09 DIAGNOSIS — N186 End stage renal disease: Secondary | ICD-10-CM | POA: Diagnosis not present

## 2019-12-09 DIAGNOSIS — E8770 Fluid overload, unspecified: Secondary | ICD-10-CM | POA: Diagnosis not present

## 2019-12-09 DIAGNOSIS — N2581 Secondary hyperparathyroidism of renal origin: Secondary | ICD-10-CM | POA: Diagnosis not present

## 2019-12-10 ENCOUNTER — Ambulatory Visit: Payer: Medicare HMO | Admitting: Physician Assistant

## 2019-12-10 ENCOUNTER — Ambulatory Visit (INDEPENDENT_AMBULATORY_CARE_PROVIDER_SITE_OTHER): Payer: Medicare HMO | Admitting: Pharmacist

## 2019-12-10 ENCOUNTER — Other Ambulatory Visit (INDEPENDENT_AMBULATORY_CARE_PROVIDER_SITE_OTHER): Payer: Medicare HMO

## 2019-12-10 ENCOUNTER — Other Ambulatory Visit: Payer: Self-pay

## 2019-12-10 ENCOUNTER — Encounter: Payer: Self-pay | Admitting: Physician Assistant

## 2019-12-10 VITALS — BP 112/89 | HR 105 | Temp 97.2°F | Ht 74.0 in | Wt 180.0 lb

## 2019-12-10 DIAGNOSIS — I469 Cardiac arrest, cause unspecified: Secondary | ICD-10-CM | POA: Diagnosis not present

## 2019-12-10 DIAGNOSIS — R110 Nausea: Secondary | ICD-10-CM

## 2019-12-10 DIAGNOSIS — I480 Paroxysmal atrial fibrillation: Secondary | ICD-10-CM | POA: Diagnosis not present

## 2019-12-10 DIAGNOSIS — I482 Chronic atrial fibrillation, unspecified: Secondary | ICD-10-CM | POA: Diagnosis not present

## 2019-12-10 DIAGNOSIS — N186 End stage renal disease: Secondary | ICD-10-CM | POA: Diagnosis not present

## 2019-12-10 DIAGNOSIS — B20 Human immunodeficiency virus [HIV] disease: Secondary | ICD-10-CM | POA: Diagnosis not present

## 2019-12-10 DIAGNOSIS — I5042 Chronic combined systolic (congestive) and diastolic (congestive) heart failure: Secondary | ICD-10-CM

## 2019-12-10 LAB — POCT INR: INR: 1.2 — AB (ref 2.0–3.0)

## 2019-12-10 NOTE — Patient Instructions (Signed)
Take warfarin 10mg  today ONLY, then continue taking 5mg  daily except 2.5mg  (1/2 tablet) every Monday and Friday. Repeat INR in 1 week at dialysis. Call coumadin clinic at [phone] if questions.

## 2019-12-10 NOTE — Progress Notes (Signed)
Cardiology Office Note:    Date:  12/11/2019   ID:  [facility], DOB 06-Jun-1941, MRN [id number]  PCP:  [name], NP  Cardiologist:  [name], MD  Electrophysiologist:  None   Referring MD: [name], NP   Chief Complaint  Patient presents with  . Follow-up    seen for [name]    History of Present Illness:    David [name] is a 79 y.o. male with a hx of combined systolic and diastolic heart failure, PAF, PEA arrest, HIV, nonischemic cardiomyopathy, and end-stage renal disease.  He has normal coronary artery angiography in 2004 and suffered a PEA arrest in March 2019.  Event was complicated by the development of new atrial fibrillation on the progression to end-stage renal disease on dialysis.  Coronary angiography after his cardiac arrest only showed minor atherosclerosis without significant stenotic lesion.  He has multiple failed AV fistula in the past.  Patient had required midodrine on dialysis days.  Patient also required more frequent dialysis days due to recurrent heart failure symptom, he is currently on dialysis Monday, Tuesday, Thursday and Saturday.  Echocardiogram obtained on 02/18/2018 showed EF 15 to 20%, mild AI.  Echocardiogram obtained on 03/04/2019 showed EF 20 to 25%, no LV apical thrombus, mild AI.   More recently, patient was admitted in September 2020 due to worsening heart failure after attempt was made to decrease his dialysis to 3 times a week.  Covid test was negative.  Chest x-ray consistent with pulmonary edema.  Symptom improved after dialysis.  He was readmitted in November 2020 with worsening shortness of breath after he got off the dialysis schedule due to holidays.  Symptoms again improved with dialysis.  He had a left thigh AV graft placed that by Dr. Eden Lathe on 11/10/2019.  Unfortunately this thrombosed as well and he underwent thrombectomy by Dr. Carlis Abbott on 12/14.  He eventually underwent a right SFA to common femoral  vein AV loop graft by Dr. Donzetta Matters on 12/02/2019.  Patient presents today for cardiology office visit.  He appears to be quite weak and was barely able to move his legs.  His voice is soft however his thoughts were still clear.  He was accompanied by his wife.  His wife has noticed significant decline in his functional ability over the past several weeks.  He says he has been nauseated and has been unable to keep any food down for the past several days.  On physical exam, he appears to be euvolemic.  EKG showed sinus tachycardia with heart rate of 104.  I discussed his case with DOD Dr. Harrell Gave, I suspect patient is near low output heart failure at this point.  He would not be a candidate for more advanced heart failure therapy.  Unfortunately his blood pressure does not allow any addition of heart failure medication either.  Sometimes his blood pressure will drop down to the 80s even on midodrine.  I suspect patient's life expectancy is less than 6 months.  I talked to the both the patient and his wife regarding DNR and DNI status, patient still wants full code at this time.  I think it is prudent for the family and that the patient to discuss with palliative care to discuss goals of care at this time.  I plan to bring the patient back in 1 month for follow-up.  Past Medical History:  Diagnosis Date  . 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 Acute on chronic systolic and diastolic heart failure,  NYHA class 4 (HCC)   . Anemia, iron deficiency 11/15/2011  . Arthritis    "hands, right knee, feet" (02/21/2018)  . Atrial fibrillation ([place])   . Cancer [facility])    hx of prostate; s/p radioactive seed implant 10/2009 Dr Janice Norrie  . Cardiac arrest ([place]) 02/17/2018  . CHF (congestive heart failure) ([place])   . Chronic combined systolic and diastolic heart failure, NYHA class 3 ([place]) 06/2010   felt to be secondary to hypertensive cardiomyopathy  . Chronic lower back pain   . CKD (chronic kidney disease) stage V requiring  chronic dialysis ([place])   . ESRD on dialysis [facility])    started 02/2018 [place]  . GERD (gastroesophageal reflux disease)   . Glaucoma    "I had surgery and dont have it any more"  . Gout    daily RX (02/21/2018)  . Heart murmur    "mild" per pt  . Hepatitis    years ago  . HIV (human immunodeficiency virus infection) ([place])   . HIV infection ([place])   . Hyperlipidemia   . Hypertension    followed by [facility] and Vascular (Dr Dani Gobble Croitoru)  . Hypertension   . Nonischemic cardiomyopathy ([place])   . Pneumonia 11/2017  . Prostate cancer ([place])   . Sinus bradycardia   . Sleep apnea    does not use a cpap  . Stroke [facility])    "mini stroke" years ago  . Wears glasses     Past Surgical History:  Procedure Laterality Date  . AV FISTULA PLACEMENT Right 10/13/2016   Procedure: ARTERIOVENOUS (AV) FISTULA CREATION;  Surgeon: [name], MD;  Location: [place];  Service: Vascular;  Laterality: Right;  . AV FISTULA PLACEMENT Left 02/25/2018   Procedure: INSERTION OF ARTERIOVENOUS (AV) GORE-TEX GRAFT LEFT UPPER ARM;  Surgeon: Angelia Mould, MD;  Location: [place];  Service: Vascular;  Laterality: Left;  . AV FISTULA PLACEMENT Right 08/07/2018   Procedure: Creation of right arm brachiocephalic Fistula;  Surgeon: Waynetta Sandy, MD;  Location: [place];  Service: Vascular;  Laterality: Right;  . AV FISTULA PLACEMENT Left 11/10/2019   Procedure: INSERTION OF ARTERIOVENOUS (AV) GORE-TEX GRAFT THIGH;  Surgeon: Elam Dutch, MD;  Location: [place];  Service: Vascular;  Laterality: Left;  . AV FISTULA PLACEMENT Right 12/02/2019   Procedure: INSERTION OF ARTERIOVENOUS (AV) GORE-TEX GRAFT RIGHT  THIGH;  Surgeon: Waynetta Sandy, MD;  Location: [place];  Service: Vascular;  Laterality: Right;  . BASCILIC VEIN TRANSPOSITION Left 06/24/2015   Procedure: BASILIC VEIN TRANSPOSITION;  Surgeon: Mal Misty, MD;  Location: [place];  Service: Vascular;   Laterality: Left;  . BIOPSY  03/14/2019   Procedure: BIOPSY;  Surgeon: Irene Shipper, MD;  Location: [place];  Service: Endoscopy;;  . CATARACT EXTRACTION, BILATERAL Bilateral   . COLONOSCOPY WITH PROPOFOL N/A 03/14/2019   Procedure: COLONOSCOPY WITH PROPOFOL;  Surgeon: Irene Shipper, MD;  Location: [name] ENDOSCOPY;  Service: Endoscopy;  Laterality: N/A;  . ESOPHAGOGASTRODUODENOSCOPY (EGD) WITH PROPOFOL N/A 05/05/2014   Procedure: ESOPHAGOGASTRODUODENOSCOPY (EGD) WITH PROPOFOL;  Surgeon: Missy Sabins, MD;  Location: [facility] ENDOSCOPY;  Service: Endoscopy;  Laterality: N/A;  . EYE SURGERY Bilateral    cataract surgery   . GLAUCOMA SURGERY Bilateral   . INSERTION OF ARTERIOVENOUS (AV) ARTEGRAFT ARM  10/09/2018   Procedure: INSERTION OF ARTERIOVENOUS (AV) 3mm x 41cm ARTEGRAFT LEFT UPPER ARM;  Surgeon: Waynetta Sandy, MD;  Location: [place];  Service: Vascular;;  . INSERTION OF DIALYSIS CATHETER Right 07/14/2019   Procedure: Insertion Of Dialysis Catheter;  Surgeon: Elam Dutch, MD;  Location: [facility] OR;  Service: Vascular;  Laterality: Right;  placed right Internal Jugular  . INSERTION PROSTATE RADIATION SEED    . IR FLUORO GUIDE CV LINE RIGHT  02/18/2018  . IR REMOVAL TUN CV CATH W/O FL  12/06/2018  . IR US GUIDE VASC ACCESS RIGHT  02/18/2018  . LEFT HEART CATH AND CORONARY ANGIOGRAPHY N/A 02/20/2018   Procedure: LEFT HEART CATH AND CORONARY ANGIOGRAPHY;  Surgeon: Jettie Booze, MD;  Location: [place] CV LAB;;; 50% ostial OM2, 25% mLAD.  Marland Kitchen LEFT HEART CATH AND CORONARY ANGIOGRAPHY  2004   mildly depressed LV systolic fx EF 53%,GDJMEQ coronaries/abdominal aorta/renal arteries.  . LOWER EXTREMITY ANGIOGRAM Left 11/17/2019   Procedure: Lower Extremity Fistulogram;  Surgeon: Marty Heck, MD;  Location: [place];  Service: Vascular;  Laterality: Left;  . NM [place]  02/21/2010   No ischemia or infarction.  EF 27%.  Marland Kitchen RADIOACTIVE SEED IMPLANT  2010   prostate cancer   . REVISION OF ARTERIOVENOUS GORETEX GRAFT Right 07/11/2019   Procedure: REVISION OF ARTERIOVENOUS GORETEX GRAFT RIGHT ARM WITH ARTEGRAFT;  Surgeon: Serafina Mitchell, MD;  Location: [place];  Service: Vascular;  Laterality: Right;  . SHUNTOGRAM Right 07/14/2019   Procedure: Shuntogram;  Surgeon: Elam Dutch, MD;  Location: [place];  Service: Vascular;  Laterality: Right;  . THROMBECTOMY AND REVISION OF ARTERIOVENTOUS (AV) GORETEX  GRAFT Right 07/14/2019   Procedure: THROMBECTOMY AND REVISION OF ARTERIOVENTOUS (AV) GORETEX  GRAFT;  Surgeon: Elam Dutch, MD;  Location: [place];  Service: Vascular;  Laterality: Right;  . THROMBECTOMY W/ EMBOLECTOMY Left 02/26/2018   Procedure: THROMBECTOMY ARTERIOVENOUS GRAFT;  Surgeon: Angelia Mould, MD;  Location: [place];  Service: Vascular;  Laterality: Left;  . THROMBECTOMY W/ EMBOLECTOMY Left 11/17/2019   Procedure: THROMBECTOMY ARTERIOVENOUS GRAFT LEFT THIGH;  Surgeon: Marty Heck, MD;  Location: [place];  Service: Vascular;  Laterality: Left;  . TRANSTHORACIC ECHOCARDIOGRAM  02/2012   [facility]) EF 45-50% with mild global HK.  Mild LVH,LA mod. dilated,mild-mod. 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 AI.  Marland Kitchen TRANSTHORACIC ECHOCARDIOGRAM  9/'18; 3/'19   a) Severely reduced EF.  25 from 30%.  GR 1 DD with diffuse ecchymosis. Mild AS & AI.  Mild LA/RA dilation; b) (post PEA arest):   Moderately dilated LV with moderate concentric virtually.  EF 15 to 20%.  GR 1 DD.  Paradoxical septal motion. Mild AI.  Mild LA dilation.  Poorly visualized RV.  Marland Kitchen TRANSTHORACIC ECHOCARDIOGRAM  02/2019   EF 20-25%.  Unable to assess diastolic function (A. Fib).  No LV apical thrombus.  Mild AI.  Mildly reduced RV function, however poorly visualized.  Marland Kitchen ULTRASOUND GUIDANCE FOR VASCULAR ACCESS  02/20/2018   Procedure: Ultrasound Guidance For Vascular Access;  Surgeon: Jettie Booze, MD;  Location: [place] CV LAB;  Service: Cardiovascular;;   . UPPER EXTREMITY VENOGRAPHY N/A 07/08/2018   Procedure: UPPER EXTREMITY VENOGRAPHY;  Surgeon: Waynetta Sandy, MD;  Location: [place] CV LAB;  Service: Cardiovascular;  Laterality: N/A;  Bilateral    Current Medications: Current Meds  Medication Sig  . acetaminophen (TYLENOL) 500 MG tablet Take 1,000 mg by mouth every 6 (six) hours as needed for moderate pain.  Marland Kitchen allopurinol (ZYLOPRIM) 100 MG tablet 1 tablet by mouth 3x weekly  after dialysis.  Marland Kitchen amiodarone (PACERONE) 200 MG tablet Take 1 tablet (200 mg total) by mouth daily.  . bictegravir-emtricitabine-tenofovir AF ([name]) 50-200-25 MG TABS tablet Take 1 tablet by mouth daily.  . brimonidine (ALPHAGAN) 0.15 % ophthalmic solution Place 1 drop into the left eye 2 (two) times daily.  . dorzolamide-timolol (COSOPT) 22.3-6.8 MG/ML ophthalmic solution Place 1 drop into the left eye 2 (two) times daily.  Marland Kitchen latanoprost (XALATAN) 0.005 % ophthalmic solution Place 1 drop into the left eye at bedtime.   Marland Kitchen loratadine (CLARITIN) 10 MG tablet Take 10 mg by mouth daily.  . midodrine (PROAMATINE) 10 MG tablet Take 1 tablet (10 mg total) by mouth as directed. Twice a day on the day of HD Tuesday, Thursday, Saturday (1 in AM and 1 at [place]). Take 1/2 tablet (5mg ) a day on non HD days (Patient taking differently: Take 10 mg by mouth See admin instructions. Take 1 tablet (10 mg) by mouth 3 times daily on Tuesdays, Thursdays, & Saturdays.)  . montelukast (SINGULAIR) 10 MG tablet Take 1 tablet (10 mg total) by mouth at bedtime.  . multivitamin (RENA-VIT) TABS tablet Take 1 tablet by mouth daily at 2 PM.   . traMADol (ULTRAM) 50 MG tablet Take 1 tablet (50 mg total) by mouth every 6 (six) hours as needed.  . VELPHORO 500 MG chewable tablet Chew 500 mg by mouth 3 (three) times daily with meals.   . warfarin (COUMADIN) 5 MG tablet TAKE 1/2 TO 1 TABLET BY MOUTH DAILY AS DIRECTED BY COUMADIN CLINIC (Patient taking differently: Take 2.5-5 mg by mouth See  admin instructions. Take 1/2 to 1 tablet by mouth daily as directed by coumadin clinic; 2.5 mg on Mon and Fri, 5 ,g all other days)  . [DISCONTINUED] ferric citrate (AURYXIA) 1 GM 210 MG(Fe) tablet Take 420 mg by mouth 3 (three) times daily with meals.     Allergies:   Dextromethorphan-guaifenesin, Tocotrienols, and Losartan potassium   Social History   Socioeconomic History  . Marital status: Married    Spouse name: Not on file  . Number of children: 2  . Years of education: 86  . Highest education level: Not on file  Occupational History  . 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 Smoking status: Former Smoker    Packs/day: 1.00    Years: 30.00    Pack years: 30.00    Types: Cigarettes    Quit date: 2006    Years since quitting: 15.0  . Smokeless tobacco: Never Used  Substance and Sexual Activity  . Alcohol use: Yes    Alcohol/week: 1.0 standard drinks    Types: 1 Shots of liquor per week    Comment: occasional  . Drug use: Never  . Sexual activity: Not Currently    Comment: declined condoms 09/2019  Other Topics Concern  . Not on file  Social History Narrative   ** Merged History Encounter **       Stays active at home Regular exercise: no Drinks 2 cups of coffee a week, 1 mountain dew soda a day.   Social Determinants of Health   Financial Resource Strain:   . Difficulty of Paying Living Expenses: Not on file  Food Insecurity: No Food Insecurity  . Worried About Charity fundraiser in the Last Year: Never true  . Ran Out of Food in the Last Year: Never true  Transportation Needs: No Transportation Needs  .  Lack of Transportation (Medical): No  . Lack of Transportation (Non-Medical): No  Physical Activity:   . Days of Exercise per Week: Not on file  . Minutes of Exercise per Session: Not on file  Stress:   . Feeling of Stress : Not on file  Social Connections:   . Frequency of Communication with Friends and Family:  Not on file  . Frequency of Social Gatherings with Friends and Family: Not on file  . Attends Religious Services: Not on file  . Active Member of Clubs or Organizations: Not on file  . Attends Archivist Meetings: Not on file  . Marital Status: Not on file     Family History: The patient's family history includes Cholelithiasis in his daughter and son; Diabetes in his maternal grandmother; Hypertension in his maternal grandmother and mother; Thyroid disease in his mother. There is no history of Heart attack or Hyperlipidemia.  ROS:   Please see the history of present illness.     All other systems reviewed and are negative.  EKGs/Labs/Other Studies Reviewed:    The following studies were reviewed today:  Echo 02/2019  1. The left ventricle has severely reduced systolic function, with an ejection fraction of 20-25%. The cavity size was moderately dilated. Left ventricular diastolic function could not be evaluated. Left ventricular diffuse hypokinesis.  2. No LV apical thrombus noted with Definity contrast enhancement.  3. The aortic valve was not well visualized. Aortic valve regurgitation is mild by color flow Doppler.   EKG:  EKG is ordered today.  The ekg ordered today demonstrates sinus tachycardia, no significant ST-T wave changes.  Left bundle branch block.  Recent Labs: 12/11/2019: ALT 23; B Natriuretic Peptide 632.3; BUN 22; Creatinine, Ser 6.08; Hemoglobin 9.6; Magnesium 2.0; Platelets 144; Potassium 4.3; Sodium 137; TSH 0.294  Recent Lipid Panel    Component Value Date/Time   CHOL 221 (H) 12/18/2018 0930   CHOL 197 12/31/2017 0945   TRIG 116 03/25/2019 0148   HDL 45 12/18/2018 0930   HDL 47 12/31/2017 0945   CHOLHDL 4.9 12/18/2018 0930   VLDL 12 05/22/2017 0926   LDLCALC 145 (H) 12/18/2018 0930    Physical Exam:    VS:  BP 112/89   Pulse (!) 105   Temp (!) 97.2 F (36.2 C) (Temporal)   Ht 6\' 2"  (1.88 m)   Wt 180 lb (81.6 kg)   SpO2 98%   BMI 23.11  kg/m     Wt Readings from Last 3 Encounters:  12/11/19 180 lb (81.6 kg)  12/10/19 180 lb (81.6 kg)  12/02/19 184 lb (83.5 kg)     GEN:  Frail, weak HEENT: Normal NECK: No JVD; No carotid bruits LYMPHATICS: No lymphadenopathy CARDIAC: RRR, no murmurs, rubs, gallops RESPIRATORY:  Clear to auscultation without rales, wheezing or rhonchi  ABDOMEN: Soft, non-tender, non-distended MUSCULOSKELETAL:  No edema; No deformity  SKIN: Warm and dry NEUROLOGIC:  Alert and oriented x 3 PSYCHIATRIC:  Normal affect   ASSESSMENT:    1. Nausea   2. Chronic combined systolic and diastolic heart failure, NYHA class 3 ([place])   3. PAF (paroxysmal atrial fibrillation) ([place])   4. HIV infection, unspecified symptom status ([place])   5. ESRD (end stage renal disease) ([place])   6. PEA (Pulseless electrical activity) (HCC)    PLAN:    In order of problems listed above:  1. Nausea: According to his wife, he has been having nausea and weakness for the past several days.  He denies any fever.  I initially questioned whether he is near end-stage heart failure at this point, I discussed the case with DOD Dr. Harrell Gave who also was agreeable to initiate discussion of goals of life.  Addendum: I was notified the patient was tested positive for COVID-19, this likely the main reason he was very weak.   2. Chronic combined systolic and diastolic heart failure: Baseline EF 20 to 25% based on last echocardiogram.  3. PAF: On Coumadin, will have Coumadin clinic to check his INR level.  Unable to tolerate any rate control medication other than amiodarone  4. HIV: Managed by infectious disease  5. End-stage renal disease: He required frequent dialysis.  He has history of acute heart failure with 3 days of weekly dialysis, currently he is on 4 days a week at dialysis every Monday, Tuesday, Thursday and Saturday.  He had multiple AV fistula surgery in the past that had thrombosed  6. History of PEA arrest: Coronary  angiography after cardiac arrest only showed minor atherosclerosis without significant stenotic lesion.   Medication Adjustments/Labs and Tests Ordered: Current medicines are reviewed at length with the patient today.  Concerns regarding medicines are outlined above.  Orders Placed This Encounter  Procedures  . Ambulatory referral to Hospice  . EKG 12-Lead   No orders of the defined types were placed in this encounter.   Patient Instructions  Medication Instructions:  Your physician recommends that you continue on your current medications as directed. Please refer to the Current Medication list given to you today. *If you need a refill on your cardiac medications before your next appointment, please call your pharmacy*  Lab Work: None  If you have labs (blood work) drawn today and your tests are completely normal, you will receive your results only by: Marland Kitchen MyChart Message (if you have MyChart) OR . A paper copy in the mail If you have any lab test that is abnormal or we need to change your treatment, we will call you to review the results.  Testing/Procedures: None   Follow-Up: At [facility], you and your health needs are our priority.  As part of our continuing mission to provide you with exceptional heart care, we have created designated Provider Care Teams.  These Care Teams include your primary Cardiologist (physician) and Advanced Practice Providers (APPs -  Physician Assistants and Nurse Practitioners) who all work together to provide you with the care you need, when you need it.  Your next appointment:   1 month(s)  The format for your next appointment:   In Person  Provider:   You may see [name], MD or one of the following Advanced Practice Providers on your designated Care Team:    Almyra Deforest, PA-C (IF APPT IS WITH Glendel Jaggers PLEASE MAKE SURE DR CROITORU IS IN THE OFFICE)  Fabian Sharp, PA-C or   Roby Lofts, PA-C   Other Instructions     Signed, Almyra Deforest, [place]  12/11/2019 5:54 PM    [place]

## 2019-12-10 NOTE — Patient Instructions (Signed)
Medication Instructions:  Your physician recommends that you continue on your current medications as directed. Please refer to the Current Medication list given to you today. *If you need a refill on your cardiac medications before your next appointment, please call your pharmacy*  Lab Work: None  If you have labs (blood work) drawn today and your tests are completely normal, you will receive your results only by: Marland Kitchen MyChart Message (if you have MyChart) OR . A paper copy in the mail If you have any lab test that is abnormal or we need to change your treatment, we will call you to review the results.  Testing/Procedures: None   Follow-Up: At [facility], you and your health needs are our priority.  As part of our continuing mission to provide you with exceptional heart care, we have created designated Provider Care Teams.  These Care Teams include your primary Cardiologist (physician) and Advanced Practice Providers (APPs -  Physician Assistants and Nurse Practitioners) who all work together to provide you with the care you need, when you need it.  Your next appointment:   1 month(s)  The format for your next appointment:   In Person  Provider:   You may see [name], MD or one of the following Advanced Practice Providers on your designated Care Team:    Almyra Deforest, PA-C (IF APPT IS WITH [facility] PLEASE MAKE SURE DR Sallyanne Kuster IS IN THE OFFICE)  Fabian Sharp, PA-C or   Roby Lofts, PA-C   Other Instructions

## 2019-12-11 ENCOUNTER — Inpatient Hospital Stay (HOSPITAL_COMMUNITY)
Admission: EM | Admit: 2019-12-11 | Discharge: 2020-01-09 | DRG: 974 | Disposition: A | Discharge status: Skilled Nursing Facility | Payer: Medicare HMO | Attending: Internal Medicine | Admitting: Internal Medicine

## 2019-12-11 ENCOUNTER — Telehealth: Payer: Self-pay | Admitting: Physician Assistant

## 2019-12-11 ENCOUNTER — Encounter: Payer: Self-pay | Admitting: Physician Assistant

## 2019-12-11 ENCOUNTER — Emergency Department (HOSPITAL_COMMUNITY): Payer: Medicare HMO

## 2019-12-11 ENCOUNTER — Other Ambulatory Visit: Payer: Self-pay

## 2019-12-11 DIAGNOSIS — I639 Cerebral infarction, unspecified: Secondary | ICD-10-CM | POA: Diagnosis not present

## 2019-12-11 DIAGNOSIS — Z7401 Bed confinement status: Secondary | ICD-10-CM | POA: Diagnosis not present

## 2019-12-11 DIAGNOSIS — Z7189 Other specified counseling: Secondary | ICD-10-CM | POA: Diagnosis not present

## 2019-12-11 DIAGNOSIS — R112 Nausea with vomiting, unspecified: Secondary | ICD-10-CM | POA: Diagnosis present

## 2019-12-11 DIAGNOSIS — R7303 Prediabetes: Secondary | ICD-10-CM | POA: Diagnosis present

## 2019-12-11 DIAGNOSIS — I248 Other forms of acute ischemic heart disease: Secondary | ICD-10-CM | POA: Diagnosis present

## 2019-12-11 DIAGNOSIS — J1282 Pneumonia due to coronavirus disease 2019: Secondary | ICD-10-CM | POA: Diagnosis not present

## 2019-12-11 DIAGNOSIS — I959 Hypotension, unspecified: Secondary | ICD-10-CM | POA: Diagnosis present

## 2019-12-11 DIAGNOSIS — Z8546 Personal history of malignant neoplasm of prostate: Secondary | ICD-10-CM

## 2019-12-11 DIAGNOSIS — I447 Left bundle-branch block, unspecified: Secondary | ICD-10-CM | POA: Diagnosis not present

## 2019-12-11 DIAGNOSIS — M7989 Other specified soft tissue disorders: Secondary | ICD-10-CM | POA: Diagnosis not present

## 2019-12-11 DIAGNOSIS — Z7901 Long term (current) use of anticoagulants: Secondary | ICD-10-CM

## 2019-12-11 DIAGNOSIS — U071 COVID-19: Principal | ICD-10-CM | POA: Diagnosis present

## 2019-12-11 DIAGNOSIS — D696 Thrombocytopenia, unspecified: Secondary | ICD-10-CM | POA: Diagnosis not present

## 2019-12-11 DIAGNOSIS — Z66 Do not resuscitate: Secondary | ICD-10-CM | POA: Diagnosis not present

## 2019-12-11 DIAGNOSIS — E872 Acidosis: Secondary | ICD-10-CM | POA: Diagnosis present

## 2019-12-11 DIAGNOSIS — Z8674 Personal history of sudden cardiac arrest: Secondary | ICD-10-CM

## 2019-12-11 DIAGNOSIS — D631 Anemia in chronic kidney disease: Secondary | ICD-10-CM | POA: Diagnosis not present

## 2019-12-11 DIAGNOSIS — E785 Hyperlipidemia, unspecified: Secondary | ICD-10-CM | POA: Diagnosis present

## 2019-12-11 DIAGNOSIS — I480 Paroxysmal atrial fibrillation: Secondary | ICD-10-CM | POA: Diagnosis present

## 2019-12-11 DIAGNOSIS — M19042 Primary osteoarthritis, left hand: Secondary | ICD-10-CM | POA: Diagnosis present

## 2019-12-11 DIAGNOSIS — I5042 Chronic combined systolic (congestive) and diastolic (congestive) heart failure: Secondary | ICD-10-CM | POA: Diagnosis not present

## 2019-12-11 DIAGNOSIS — N2581 Secondary hyperparathyroidism of renal origin: Secondary | ICD-10-CM | POA: Diagnosis present

## 2019-12-11 DIAGNOSIS — R29810 Facial weakness: Secondary | ICD-10-CM | POA: Diagnosis not present

## 2019-12-11 DIAGNOSIS — Z8249 Family history of ischemic heart disease and other diseases of the circulatory system: Secondary | ICD-10-CM

## 2019-12-11 DIAGNOSIS — M5116 Intervertebral disc disorders with radiculopathy, lumbar region: Secondary | ICD-10-CM | POA: Diagnosis not present

## 2019-12-11 DIAGNOSIS — I9589 Other hypotension: Secondary | ICD-10-CM | POA: Diagnosis not present

## 2019-12-11 DIAGNOSIS — R5381 Other malaise: Secondary | ICD-10-CM | POA: Diagnosis not present

## 2019-12-11 DIAGNOSIS — M2550 Pain in unspecified joint: Secondary | ICD-10-CM | POA: Diagnosis not present

## 2019-12-11 DIAGNOSIS — I5040 Unspecified combined systolic (congestive) and diastolic (congestive) heart failure: Secondary | ICD-10-CM | POA: Diagnosis not present

## 2019-12-11 DIAGNOSIS — M109 Gout, unspecified: Secondary | ICD-10-CM | POA: Diagnosis present

## 2019-12-11 DIAGNOSIS — M19041 Primary osteoarthritis, right hand: Secondary | ICD-10-CM | POA: Diagnosis present

## 2019-12-11 DIAGNOSIS — I132 Hypertensive heart and chronic kidney disease with heart failure and with stage 5 chronic kidney disease, or end stage renal disease: Secondary | ICD-10-CM | POA: Diagnosis present

## 2019-12-11 DIAGNOSIS — N186 End stage renal disease: Secondary | ICD-10-CM | POA: Diagnosis present

## 2019-12-11 DIAGNOSIS — L89892 Pressure ulcer of other site, stage 2: Secondary | ICD-10-CM | POA: Diagnosis not present

## 2019-12-11 DIAGNOSIS — I5084 End stage heart failure: Secondary | ICD-10-CM | POA: Diagnosis present

## 2019-12-11 DIAGNOSIS — D509 Iron deficiency anemia, unspecified: Secondary | ICD-10-CM | POA: Diagnosis not present

## 2019-12-11 DIAGNOSIS — I428 Other cardiomyopathies: Secondary | ICD-10-CM | POA: Diagnosis not present

## 2019-12-11 DIAGNOSIS — Z8349 Family history of other endocrine, nutritional and metabolic diseases: Secondary | ICD-10-CM

## 2019-12-11 DIAGNOSIS — B20 Human immunodeficiency virus [HIV] disease: Secondary | ICD-10-CM | POA: Diagnosis not present

## 2019-12-11 DIAGNOSIS — R0602 Shortness of breath: Secondary | ICD-10-CM | POA: Diagnosis not present

## 2019-12-11 DIAGNOSIS — R627 Adult failure to thrive: Secondary | ICD-10-CM | POA: Diagnosis not present

## 2019-12-11 DIAGNOSIS — E875 Hyperkalemia: Secondary | ICD-10-CM | POA: Diagnosis not present

## 2019-12-11 DIAGNOSIS — G8929 Other chronic pain: Secondary | ICD-10-CM | POA: Diagnosis present

## 2019-12-11 DIAGNOSIS — M545 Low back pain: Secondary | ICD-10-CM | POA: Diagnosis present

## 2019-12-11 DIAGNOSIS — Z6822 Body mass index (BMI) 22.0-22.9, adult: Secondary | ICD-10-CM

## 2019-12-11 DIAGNOSIS — R6510 Systemic inflammatory response syndrome (SIRS) of non-infectious origin without acute organ dysfunction: Secondary | ICD-10-CM | POA: Diagnosis present

## 2019-12-11 DIAGNOSIS — M19071 Primary osteoarthritis, right ankle and foot: Secondary | ICD-10-CM | POA: Diagnosis present

## 2019-12-11 DIAGNOSIS — H409 Unspecified glaucoma: Secondary | ICD-10-CM | POA: Diagnosis present

## 2019-12-11 DIAGNOSIS — L89312 Pressure ulcer of right buttock, stage 2: Secondary | ICD-10-CM | POA: Diagnosis not present

## 2019-12-11 DIAGNOSIS — Z992 Dependence on renal dialysis: Secondary | ICD-10-CM

## 2019-12-11 DIAGNOSIS — Z515 Encounter for palliative care: Secondary | ICD-10-CM | POA: Diagnosis not present

## 2019-12-11 DIAGNOSIS — M48061 Spinal stenosis, lumbar region without neurogenic claudication: Secondary | ICD-10-CM | POA: Diagnosis not present

## 2019-12-11 DIAGNOSIS — Z888 Allergy status to other drugs, medicaments and biological substances status: Secondary | ICD-10-CM

## 2019-12-11 DIAGNOSIS — Z87891 Personal history of nicotine dependence: Secondary | ICD-10-CM

## 2019-12-11 DIAGNOSIS — R531 Weakness: Secondary | ICD-10-CM | POA: Diagnosis not present

## 2019-12-11 DIAGNOSIS — K219 Gastro-esophageal reflux disease without esophagitis: Secondary | ICD-10-CM | POA: Diagnosis not present

## 2019-12-11 DIAGNOSIS — M19072 Primary osteoarthritis, left ankle and foot: Secondary | ICD-10-CM | POA: Diagnosis present

## 2019-12-11 DIAGNOSIS — Z923 Personal history of irradiation: Secondary | ICD-10-CM | POA: Diagnosis not present

## 2019-12-11 DIAGNOSIS — L8990 Pressure ulcer of unspecified site, unspecified stage: Secondary | ICD-10-CM | POA: Insufficient documentation

## 2019-12-11 DIAGNOSIS — C61 Malignant neoplasm of prostate: Secondary | ICD-10-CM | POA: Diagnosis not present

## 2019-12-11 DIAGNOSIS — K5900 Constipation, unspecified: Secondary | ICD-10-CM | POA: Diagnosis not present

## 2019-12-11 DIAGNOSIS — M1711 Unilateral primary osteoarthritis, right knee: Secondary | ICD-10-CM | POA: Diagnosis present

## 2019-12-11 DIAGNOSIS — E8770 Fluid overload, unspecified: Secondary | ICD-10-CM | POA: Diagnosis not present

## 2019-12-11 DIAGNOSIS — Z833 Family history of diabetes mellitus: Secondary | ICD-10-CM

## 2019-12-11 DIAGNOSIS — Z8673 Personal history of transient ischemic attack (TIA), and cerebral infarction without residual deficits: Secondary | ICD-10-CM | POA: Diagnosis not present

## 2019-12-11 LAB — COMPREHENSIVE METABOLIC PANEL
ALT: 23 U/L (ref 0–44)
AST: 68 U/L — ABNORMAL HIGH (ref 15–41)
Albumin: 3 g/dL — ABNORMAL LOW (ref 3.5–5.0)
Alkaline Phosphatase: 98 U/L (ref 38–126)
Anion gap: 16 — ABNORMAL HIGH (ref 5–15)
BUN: 22 mg/dL (ref 8–23)
CO2: 27 mmol/L (ref 22–32)
Calcium: 8.2 mg/dL — ABNORMAL LOW (ref 8.9–10.3)
Chloride: 94 mmol/L — ABNORMAL LOW (ref 98–111)
Creatinine, Ser: 6.08 mg/dL — ABNORMAL HIGH (ref 0.61–1.24)
GFR calc Af Amer: 9 mL/min — ABNORMAL LOW (ref >60)
GFR calc non Af Amer: 8 mL/min — ABNORMAL LOW (ref >60)
Glucose, Bld: 106 mg/dL — ABNORMAL HIGH (ref 70–99)
Potassium: 4.3 mmol/L (ref 3.5–5.1)
Sodium: 137 mmol/L (ref 135–145)
Total Bilirubin: 0.8 mg/dL (ref 0.3–1.2)
Total Protein: 7.1 g/dL (ref 6.5–8.1)

## 2019-12-11 LAB — PROTIME-INR
INR: 1.3 — ABNORMAL HIGH (ref 0.8–1.2)
Prothrombin Time: 15.9 seconds — ABNORMAL HIGH (ref 11.4–15.2)

## 2019-12-11 LAB — CBC WITH DIFFERENTIAL/PLATELET
Abs Immature Granulocytes: 0.03 10*3/uL (ref 0.00–0.07)
Basophils Absolute: 0 10*3/uL (ref 0.0–0.1)
Basophils Relative: 0 %
Eosinophils Absolute: 0 10*3/uL (ref 0.0–0.5)
Eosinophils Relative: 0 %
HCT: 29.5 % — ABNORMAL LOW (ref 39.0–52.0)
Hemoglobin: 9.6 g/dL — ABNORMAL LOW (ref 13.0–17.0)
Immature Granulocytes: 1 %
Lymphocytes Relative: 14 %
Lymphs Abs: 0.8 10*3/uL (ref 0.7–4.0)
MCH: 35.7 pg — ABNORMAL HIGH (ref 26.0–34.0)
MCHC: 32.5 g/dL (ref 30.0–36.0)
MCV: 109.7 fL — ABNORMAL HIGH (ref 80.0–100.0)
Monocytes Absolute: 0.7 10*3/uL (ref 0.1–1.0)
Monocytes Relative: 12 %
Neutro Abs: 4 10*3/uL (ref 1.7–7.7)
Neutrophils Relative %: 73 %
Platelets: 144 10*3/uL — ABNORMAL LOW (ref 150–400)
RBC: 2.69 MIL/uL — ABNORMAL LOW (ref 4.22–5.81)
RDW: 14.1 % (ref 11.5–15.5)
WBC: 5.5 10*3/uL (ref 4.0–10.5)
nRBC: 0 % (ref 0.0–0.2)

## 2019-12-11 LAB — CBC
HCT: 29.3 % — ABNORMAL LOW (ref 39.0–52.0)
Hemoglobin: 9.6 g/dL — ABNORMAL LOW (ref 13.0–17.0)
MCH: 36.2 pg — ABNORMAL HIGH (ref 26.0–34.0)
MCHC: 32.8 g/dL (ref 30.0–36.0)
MCV: 110.6 fL — ABNORMAL HIGH (ref 80.0–100.0)
Platelets: 161 10*3/uL (ref 150–400)
RBC: 2.65 MIL/uL — ABNORMAL LOW (ref 4.22–5.81)
RDW: 14.4 % (ref 11.5–15.5)
WBC: 4.9 10*3/uL (ref 4.0–10.5)
nRBC: 0 % (ref 0.0–0.2)

## 2019-12-11 LAB — TSH: TSH: 0.294 u[IU]/mL — ABNORMAL LOW (ref 0.350–4.500)

## 2019-12-11 LAB — TROPONIN I (HIGH SENSITIVITY)
Troponin I (High Sensitivity): 255 ng/L (ref <18)
Troponin I (High Sensitivity): 271 ng/L (ref <18)

## 2019-12-11 LAB — MAGNESIUM: Magnesium: 2 mg/dL (ref 1.7–2.4)

## 2019-12-11 LAB — PROCALCITONIN: Procalcitonin: 5.49 ng/mL

## 2019-12-11 LAB — POC SARS CORONAVIRUS 2 AG -  ED: SARS Coronavirus 2 Ag: POSITIVE — AB

## 2019-12-11 LAB — C-REACTIVE PROTEIN: CRP: 22.6 mg/dL — ABNORMAL HIGH (ref <1.0)

## 2019-12-11 LAB — CREATININE, SERUM
Creatinine, Ser: 7.06 mg/dL — ABNORMAL HIGH (ref 0.61–1.24)
GFR calc Af Amer: 8 mL/min — ABNORMAL LOW (ref >60)
GFR calc non Af Amer: 7 mL/min — ABNORMAL LOW (ref >60)

## 2019-12-11 LAB — LACTIC ACID, PLASMA
Lactic Acid, Venous: 2.3 mmol/L (ref 0.5–1.9)
Lactic Acid, Venous: 1.1 mmol/L (ref 0.5–1.9)

## 2019-12-11 LAB — ABO/RH: ABO/RH(D): O POS

## 2019-12-11 LAB — ETHANOL: Alcohol, Ethyl (B): <10 mg/dL (ref <10)

## 2019-12-11 LAB — BRAIN NATRIURETIC PEPTIDE: B Natriuretic Peptide: 632.3 pg/mL — ABNORMAL HIGH (ref 0.0–100.0)

## 2019-12-11 LAB — D-DIMER, QUANTITATIVE: D-Dimer, Quant: 7.41 ug/mL-FEU — ABNORMAL HIGH (ref 0.00–0.50)

## 2019-12-11 LAB — PHOSPHORUS: Phosphorus: 3.9 mg/dL (ref 2.5–4.6)

## 2019-12-11 MED ORDER — BRIMONIDINE TARTRATE 0.15 % OP SOLN
1.0000 [drp] | Freq: Two times a day (BID) | OPHTHALMIC | Status: DC
  Administered 2019-12-11 – 2020-01-09 (×56): 1 [drp] via OPHTHALMIC
  Filled 2019-12-11 (×4): qty 5

## 2019-12-11 MED ORDER — MONTELUKAST SODIUM 10 MG PO TABS
10.0000 mg | ORAL_TABLET | Freq: Every day | ORAL | Status: DC
  Administered 2019-12-11 – 2020-01-08 (×29): 10 mg via ORAL
  Filled 2019-12-11 (×31): qty 1

## 2019-12-11 MED ORDER — HYDROCOD POLST-CPM POLST ER 10-8 MG/5ML PO SUER
5.0000 mL | Freq: Two times a day (BID) | ORAL | Status: DC | PRN
  Filled 2019-12-11: qty 5

## 2019-12-11 MED ORDER — WARFARIN SODIUM 2.5 MG PO TABS: 2.5000 mg | ORAL_TABLET | ORAL | Status: DC

## 2019-12-11 MED ORDER — ACETAMINOPHEN 325 MG PO TABS
650.0000 mg | ORAL_TABLET | Freq: Four times a day (QID) | ORAL | Status: DC | PRN
  Administered 2020-01-01 – 2020-01-02 (×2): 650 mg via ORAL
  Filled 2019-12-11 (×3): qty 2

## 2019-12-11 MED ORDER — ZINC SULFATE 220 (50 ZN) MG PO CAPS
220.0000 mg | ORAL_CAPSULE | Freq: Every day | ORAL | Status: DC
  Administered 2019-12-11 – 2020-01-09 (×29): 220 mg via ORAL
  Filled 2019-12-11 (×29): qty 1

## 2019-12-11 MED ORDER — ALLOPURINOL 100 MG PO TABS
100.0000 mg | ORAL_TABLET | ORAL | Status: DC
  Administered 2019-12-15 – 2020-01-07 (×11): 100 mg via ORAL
  Filled 2019-12-11 (×11): qty 1

## 2019-12-11 MED ORDER — ACETAMINOPHEN 325 MG PO TABS
650.0000 mg | ORAL_TABLET | Freq: Once | ORAL | Status: AC
  Administered 2019-12-11: 15:00:00 650 mg via ORAL
  Filled 2019-12-11: qty 2

## 2019-12-11 MED ORDER — ASCORBIC ACID 500 MG PO TABS
500.0000 mg | ORAL_TABLET | Freq: Every day | ORAL | Status: DC
  Administered 2019-12-11 – 2020-01-09 (×29): 500 mg via ORAL
  Filled 2019-12-11 (×30): qty 1

## 2019-12-11 MED ORDER — LATANOPROST 0.005 % OP SOLN
1.0000 [drp] | Freq: Every day | OPHTHALMIC | Status: DC
  Administered 2019-12-11 – 2020-01-08 (×29): 1 [drp] via OPHTHALMIC
  Filled 2019-12-11 (×4): qty 2.5

## 2019-12-11 MED ORDER — SUCROFERRIC OXYHYDROXIDE 500 MG PO CHEW
500.0000 mg | CHEWABLE_TABLET | Freq: Three times a day (TID) | ORAL | Status: DC
  Administered 2019-12-12 – 2019-12-20 (×13): 500 mg via ORAL
  Filled 2019-12-11 (×33): qty 1

## 2019-12-11 MED ORDER — RENA-VITE PO TABS
1.0000 | ORAL_TABLET | Freq: Every day | ORAL | Status: DC
  Administered 2019-12-12 – 2019-12-16 (×5): 1 via ORAL
  Filled 2019-12-11 (×6): qty 1

## 2019-12-11 MED ORDER — WARFARIN - PHYSICIAN DOSING INPATIENT: Freq: Every day | Status: DC

## 2019-12-11 MED ORDER — DORZOLAMIDE HCL-TIMOLOL MAL 2-0.5 % OP SOLN
1.0000 [drp] | Freq: Two times a day (BID) | OPHTHALMIC | Status: DC
  Administered 2019-12-11 – 2020-01-09 (×57): 1 [drp] via OPHTHALMIC
  Filled 2019-12-11 (×3): qty 10

## 2019-12-11 MED ORDER — BICTEGRAVIR-EMTRICITAB-TENOFOV 50-200-25 MG PO TABS
1.0000 | ORAL_TABLET | Freq: Every day | ORAL | Status: DC
  Administered 2019-12-12 – 2019-12-17 (×6): 1 via ORAL
  Filled 2019-12-11 (×6): qty 1

## 2019-12-11 MED ORDER — TRAMADOL HCL 50 MG PO TABS
50.0000 mg | ORAL_TABLET | Freq: Four times a day (QID) | ORAL | Status: DC | PRN
  Administered 2019-12-18: 50 mg via ORAL
  Filled 2019-12-11: qty 1

## 2019-12-11 MED ORDER — ONDANSETRON HCL 4 MG/2ML IJ SOLN: 4.0000 mg | Freq: Four times a day (QID) | INTRAMUSCULAR | Status: DC | PRN

## 2019-12-11 MED ORDER — AMIODARONE HCL 200 MG PO TABS
200.0000 mg | ORAL_TABLET | Freq: Every day | ORAL | Status: DC
  Administered 2019-12-11 – 2020-01-09 (×29): 200 mg via ORAL
  Filled 2019-12-11 (×29): qty 1

## 2019-12-11 MED ORDER — MIDODRINE HCL 5 MG PO TABS
10.0000 mg | ORAL_TABLET | ORAL | Status: DC
  Administered 2019-12-13 – 2019-12-17 (×7): 10 mg via ORAL
  Filled 2019-12-11 (×7): qty 2

## 2019-12-11 MED ORDER — ONDANSETRON HCL 4 MG/2ML IJ SOLN
4.0000 mg | Freq: Once | INTRAMUSCULAR | Status: AC
  Administered 2019-12-11: 15:00:00 4 mg via INTRAVENOUS
  Filled 2019-12-11: qty 2

## 2019-12-11 MED ORDER — WARFARIN SODIUM 2.5 MG PO TABS
2.5000 mg | ORAL_TABLET | ORAL | Status: DC
  Filled 2019-12-11: qty 1

## 2019-12-11 MED ORDER — ONDANSETRON HCL 4 MG PO TABS: 4.0000 mg | ORAL_TABLET | Freq: Four times a day (QID) | ORAL | Status: DC | PRN

## 2019-12-11 MED ORDER — HEPARIN SODIUM (PORCINE) 5000 UNIT/ML IJ SOLN
5000.0000 [IU] | Freq: Three times a day (TID) | INTRAMUSCULAR | Status: DC
  Administered 2019-12-11 – 2019-12-14 (×7): 5000 [IU] via SUBCUTANEOUS
  Filled 2019-12-11 (×5): qty 1

## 2019-12-11 MED ORDER — WARFARIN SODIUM 5 MG PO TABS
5.0000 mg | ORAL_TABLET | ORAL | Status: DC
  Administered 2019-12-11: 22:00:00 5 mg via ORAL
  Filled 2019-12-11: qty 1

## 2019-12-11 MED ORDER — SENNOSIDES-DOCUSATE SODIUM 8.6-50 MG PO TABS: 1.0000 | ORAL_TABLET | Freq: Every evening | ORAL | Status: DC | PRN

## 2019-12-11 NOTE — Progress Notes (Signed)
   Inpatient consultation was requested on [name].  He is a 68M with ESRD on HD, chronic systolic and diastolic heart failure (LVEF 20-25%), non-obstructive CAD, PAF, HIV, and PEA arrest admitted with malaise and tremor.  He is febrile, mildly tachycardic (sinus) and BP is stable.  Troponin is elevated above his baseline elevation but flat.  He has a chronic LBBB.  He was seen in clinic yesterday by David Deforest, PA an there was concern for end stage heart failure given malaise and hypotension.  In retrospect this is likely 2/2 COVID.  BNP is lower than his baseline.    David [name] is being admitted with COVID-19 pneumonia.  There is a question of edema vs. Infiltrate on CXR, though volume will be managed with HD.  There is no indication for cardiac intervention or acute cardiac instability.  It does not seem that we have anything to add clinically. If there are specific questions, please feel free to call.  Otherwise we will be happy to see him after he has recovered from his acute illness.  David Ringstad C. Oval Linsey, MD, [facility] 12/11/2019 5:57 PM

## 2019-12-11 NOTE — Telephone Encounter (Signed)
Follow up:     Denis from [facility] opt 2 returning call back.

## 2019-12-11 NOTE — ED Notes (Signed)
UTO blood cultures, phlebotomy to attempt.

## 2019-12-11 NOTE — ED Notes (Signed)
Ordered hospital bed for pt 

## 2019-12-11 NOTE — H&P (Addendum)
History and Physical        Hospital Admission Note Date: 12/11/2019  Patient name: David [name] Medical record number: [id number] Date of birth: [birth year] Age: 79 y.o. 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  Reported decreased appetite.  Patient was at dialysis and completed 3 out of 4 hours of treatment before he was sent to ED due to weakness.  Has not had any fever, shortness of breath, chest pain, vomiting, abdominal pain or abnormal bowel movements.  Additionally, patient was seen by cardiology on 1/6 and felt the patient has severe end-stage heart failure with minimal options for treatment due to hypotension despite being on midodrine.  Admits he quit tobacco in the past, Drinks alcohol occasionally but does not remember when his last drink was.   Of note, patient was admitted September 2020 due to worsening heart failure after an attempt was made to decrease his dialysis to 3 times a week.  Covid negative at that time.  Improved after dialysis.  Readmitted November 2020 with worsening dyspnea while off dialysis schedule due to holidays.  Improved with HD.  Had left thigh AV graft placed by Dr. Eden Lathe on 11/10/2019, unfortunately this thrombosed and underwent thrombectomy by Dr. Carlis Abbott on 12/14.  Eventually underwent a right SFA to common  femoral vein AV loop graft by Dr. Donzetta Matters 12/02/19.  Recently seen in cardiology office on 12/10/2019 with functional decline and nausea, unable to keep food down.  Concern the patient was at near low output heart failure and would not be a candidate for more advanced heart failure therapy.  At that time BP did not allow for any additional heart failure medications.  GOC discussion at cardiology office however patient still wished to be full code.  Review of Systems:  Review of Systems  Constitutional: Positive for fever and malaise/fatigue.  HENT: Negative.   Eyes: Negative for blurred vision.  Respiratory: Negative for cough and shortness of breath.   Cardiovascular: Negative for chest pain and palpitations.  Gastrointestinal: Negative for diarrhea, nausea and vomiting.  Genitourinary: Negative.   Musculoskeletal: Negative for myalgias.  Skin: Negative.   Neurological: Positive for tremors and weakness.  Psychiatric/Behavioral: Negative.   All other systems reviewed and are negative.   ED Workup/Course   CXR: Ill-defined airspace opacity in the lung bases, increased on the left compared to 11/30/2019, reflecting edema and/or pneumonia EKG: Normal sinus rhythm with left bundle branch block, similar to prior Notable Labs: Troponin 255-> 271 (75 in September), BNP 623 (improved from December 1000+), glucose 106, creatinine 6.08, anion gap 16, calcium 8.2, albumin 3.0, AST 68, ALT 23, hemoglobin 9.6, INR 1.3, Covid positive 12/11/19  Vitals:  12/11/19 1044 12/11/19 1106  BP: (!) 145/76   Pulse: (!) 108   Resp: (!) 28   Temp: 98.9 F (37.2 C) (!) 100.7 F (38.2 C)  SpO2: 99%      Medical/Social/Family History   Past Medical History: Past Medical History:  Diagnosis Date  . 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 Acute on chronic systolic and diastolic heart failure, NYHA class 4 ([place])   . Anemia, iron deficiency 11/15/2011  . Arthritis    "hands, right knee, feet" (02/21/2018)  .  Atrial fibrillation ([place])   . Cancer [facility])    hx of prostate; s/p radioactive seed implant 10/2009 Dr Janice Norrie  . Cardiac arrest ([place]) 02/17/2018  . CHF (congestive heart failure) ([place])   . Chronic combined systolic and diastolic heart failure, NYHA class 3 ([place]) 06/2010   felt to be secondary to hypertensive cardiomyopathy  . Chronic lower back pain   . CKD (chronic kidney disease) stage V requiring chronic dialysis ([place])   . ESRD on dialysis [facility])    started 02/2018 [place]  . GERD (gastroesophageal reflux disease)   . Glaucoma    "I had surgery and dont have it any more"  . Gout    daily RX (02/21/2018)  . Heart murmur    "mild" per pt  . Hepatitis    years ago  . HIV (human immunodeficiency virus infection) ([place])   . HIV infection ([place])   . Hyperlipidemia   . Hypertension    followed by [facility] and Vascular (Dr Dani Gobble Croitoru)  . Hypertension   . Nonischemic cardiomyopathy ([place])   . Pneumonia 11/2017  . Prostate cancer ([place])   . Sinus bradycardia   . Sleep apnea    does not use a cpap  . Stroke [facility])    "mini stroke" years ago  . Wears glasses     Past Surgical History:  Procedure Laterality Date  . AV FISTULA PLACEMENT Right 10/13/2016   Procedure: ARTERIOVENOUS (AV) FISTULA CREATION;  Surgeon: [name], MD;  Location: [place];  Service: Vascular;  Laterality: Right;  . AV FISTULA PLACEMENT Left 02/25/2018   Procedure: INSERTION OF ARTERIOVENOUS (AV) GORE-TEX GRAFT LEFT UPPER ARM;  Surgeon: Angelia Mould, MD;  Location: [place];  Service: Vascular;  Laterality: Left;  . AV FISTULA PLACEMENT Right 08/07/2018   Procedure: Creation of right arm brachiocephalic Fistula;  Surgeon: Waynetta Sandy, MD;  Location: [place];  Service: Vascular;  Laterality: Right;  . AV FISTULA PLACEMENT Left 11/10/2019   Procedure: INSERTION OF ARTERIOVENOUS (AV) GORE-TEX GRAFT THIGH;  Surgeon: Elam Dutch, MD;  Location: [place];   Service: Vascular;  Laterality: Left;  . AV FISTULA PLACEMENT Right 12/02/2019   Procedure: INSERTION OF ARTERIOVENOUS (AV) GORE-TEX GRAFT RIGHT  THIGH;  Surgeon: Waynetta Sandy, MD;  Location: [place];  Service: Vascular;  Laterality: Right;  . BASCILIC VEIN TRANSPOSITION Left 06/24/2015   Procedure: BASILIC VEIN TRANSPOSITION;  Surgeon: Mal Misty, MD;  Location: [place];  Service: Vascular;  Laterality: Left;  . BIOPSY  03/14/2019   Procedure: BIOPSY;  Surgeon: Irene Shipper, MD;  Location: [place];  Service: Endoscopy;;  . CATARACT EXTRACTION, BILATERAL Bilateral   . COLONOSCOPY WITH PROPOFOL N/A 03/14/2019   Procedure: COLONOSCOPY WITH PROPOFOL;  Surgeon: Irene Shipper, MD;  Location: [facility] ENDOSCOPY;  Service: Endoscopy;  Laterality: N/A;  . ESOPHAGOGASTRODUODENOSCOPY (EGD) WITH PROPOFOL N/A 05/05/2014   Procedure: ESOPHAGOGASTRODUODENOSCOPY (EGD) WITH PROPOFOL;  Surgeon: Missy Sabins, MD;  Location: [facility];  Service: Endoscopy;  Laterality: N/A;  . EYE SURGERY Bilateral    cataract surgery   . GLAUCOMA SURGERY Bilateral   . INSERTION OF ARTERIOVENOUS (AV) ARTEGRAFT ARM  10/09/2018   Procedure: INSERTION OF ARTERIOVENOUS (AV) 17mm x 41cm ARTEGRAFT LEFT UPPER ARM;  Surgeon: Waynetta Sandy, MD;  Location: [place];  Service: Vascular;;  . INSERTION OF DIALYSIS CATHETER Right 07/14/2019   Procedure: Insertion Of Dialysis Catheter;  Surgeon: Elam Dutch, MD;  Location: [facility] OR;  Service: Vascular;  Laterality: Right;  placed right Internal Jugular  . INSERTION PROSTATE RADIATION SEED    . IR FLUORO GUIDE CV LINE RIGHT  02/18/2018  . IR REMOVAL TUN CV CATH W/O FL  12/06/2018  . IR US GUIDE VASC ACCESS RIGHT  02/18/2018  . LEFT HEART CATH AND CORONARY ANGIOGRAPHY N/A 02/20/2018   Procedure: LEFT HEART CATH AND CORONARY ANGIOGRAPHY;  Surgeon: Jettie Booze, MD;  Location: [place] CV LAB;;; 50% ostial OM2, 25% mLAD.  Marland Kitchen LEFT HEART CATH AND CORONARY ANGIOGRAPHY  2004    mildly depressed LV systolic fx EF 50%,YDXAJO coronaries/abdominal aorta/renal arteries.  . LOWER EXTREMITY ANGIOGRAM Left 11/17/2019   Procedure: Lower Extremity Fistulogram;  Surgeon: Marty Heck, MD;  Location: [place];  Service: Vascular;  Laterality: Left;  . NM [place]  02/21/2010   No ischemia or infarction.  EF 27%.  Marland Kitchen RADIOACTIVE SEED IMPLANT  2010   prostate cancer  . REVISION OF ARTERIOVENOUS GORETEX GRAFT Right 07/11/2019   Procedure: REVISION OF ARTERIOVENOUS GORETEX GRAFT RIGHT ARM WITH ARTEGRAFT;  Surgeon: Serafina Mitchell, MD;  Location: [place];  Service: Vascular;  Laterality: Right;  . SHUNTOGRAM Right 07/14/2019   Procedure: Shuntogram;  Surgeon: Elam Dutch, MD;  Location: [place];  Service: Vascular;  Laterality: Right;  . THROMBECTOMY AND REVISION OF ARTERIOVENTOUS (AV) GORETEX  GRAFT Right 07/14/2019   Procedure: THROMBECTOMY AND REVISION OF ARTERIOVENTOUS (AV) GORETEX  GRAFT;  Surgeon: Elam Dutch, MD;  Location: [place];  Service: Vascular;  Laterality: Right;  . THROMBECTOMY W/ EMBOLECTOMY Left 02/26/2018   Procedure: THROMBECTOMY ARTERIOVENOUS GRAFT;  Surgeon: Angelia Mould, MD;  Location: [place];  Service: Vascular;  Laterality: Left;  . THROMBECTOMY W/ EMBOLECTOMY Left 11/17/2019   Procedure: THROMBECTOMY ARTERIOVENOUS GRAFT LEFT THIGH;  Surgeon: Marty Heck, MD;  Location: [place];  Service: Vascular;  Laterality: Left;  . TRANSTHORACIC ECHOCARDIOGRAM  02/2012   [facility]) EF 45-50% with mild global HK.  Mild LVH,LA mod. dilated,mild-mod. 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 AI.  Marland Kitchen TRANSTHORACIC ECHOCARDIOGRAM  9/'18; 3/'19   a) Severely reduced EF.  25 from 30%.  GR 1 DD with diffuse ecchymosis. Mild AS & AI.  Mild LA/RA dilation; b) (post PEA arest):   Moderately dilated LV with moderate concentric virtually.  EF 15 to 20%.  GR 1 DD.  Paradoxical septal motion. Mild AI.  Mild LA dilation.  Poorly  visualized RV.  Marland Kitchen TRANSTHORACIC ECHOCARDIOGRAM  02/2019   EF 20-25%.  Unable to assess diastolic function (A. Fib).  No LV apical thrombus.  Mild AI.  Mildly reduced RV function, however poorly visualized.  Marland Kitchen ULTRASOUND GUIDANCE FOR VASCULAR ACCESS  02/20/2018   Procedure: Ultrasound Guidance For Vascular Access;  Surgeon: Jettie Booze, MD;  Location: [place] CV LAB;  Service: Cardiovascular;;  . UPPER EXTREMITY VENOGRAPHY N/A 07/08/2018   Procedure:  UPPER EXTREMITY VENOGRAPHY;  Surgeon: Waynetta Sandy, MD;  Location: [place] CV LAB;  Service: Cardiovascular;  Laterality: N/A;  Bilateral    Medications: Prior to Admission medications   Medication Sig Start Date End Date Taking? Authorizing Provider  acetaminophen (TYLENOL) 500 MG tablet Take 1,000 mg by mouth every 6 (six) hours as needed for moderate pain.   Yes [provider]  allopurinol (ZYLOPRIM) 100 MG tablet 1 tablet by mouth 3x weekly after dialysis. 09/15/19  Yes [name], NP  amiodarone (PACERONE) 200 MG tablet Take 1 tablet (200 mg total) by mouth daily. 05/12/19  Yes Croitoru, Mihai, MD  bictegravir-emtricitabine-tenofovir AF ([name]) 50-200-25 MG TABS tablet Take 1 tablet by mouth daily. 09/29/19  Yes Tommy Medal, Lavell Islam, MD  brimonidine (ALPHAGAN) 0.15 % ophthalmic solution Place 1 drop into the left eye 2 (two) times daily. 11/15/17  Yes [provider]  dorzolamide-timolol (COSOPT) 22.3-6.8 MG/ML ophthalmic solution Place 1 drop into the left eye 2 (two) times daily. 12/17/17  Yes [provider]  latanoprost (XALATAN) 0.005 % ophthalmic solution Place 1 drop into the left eye at bedtime.    Yes [provider]  loratadine (CLARITIN) 10 MG tablet Take 10 mg by mouth daily.   Yes [provider]  midodrine (PROAMATINE) 10 MG tablet Take 1 tablet (10 mg total) by mouth as directed. Twice a day on the day of HD Tuesday, Thursday, Saturday (1 in AM and 1  at [place]). Take 1/2 tablet (5mg ) a day on non HD days Patient taking differently: Take 10 mg by mouth See admin instructions. Take 1 tablet (10 mg) by mouth 3 times daily on Tuesdays, Thursdays, & Saturdays. 04/22/19  Yes Leonie Man, MD  montelukast (SINGULAIR) 10 MG tablet Take 1 tablet (10 mg total) by mouth at bedtime. 09/12/19  Yes [name], NP  multivitamin (RENA-VIT) TABS tablet Take 1 tablet by mouth daily at 2 PM.    Yes [provider]  traMADol (ULTRAM) 50 MG tablet Take 1 tablet (50 mg total) by mouth every 6 (six) hours as needed. 12/02/19  Yes Collins, Susette Racer, PA-C  VELPHORO 500 MG chewable tablet Chew 500 mg by mouth 3 (three) times daily with meals.  09/29/19  Yes [provider]  warfarin (COUMADIN) 5 MG tablet TAKE 1/2 TO 1 TABLET BY MOUTH DAILY AS DIRECTED BY COUMADIN CLINIC Patient taking differently: Take 2.5-5 mg by mouth See admin instructions. Take 1/2 to 1 tablet by mouth daily as directed by coumadin clinic; 2.5 mg on Mon and Fri, 5 ,g all other days 11/04/19  Yes Leonie Man, MD    Allergies:   Allergies  Allergen Reactions  . Dextromethorphan-Guaifenesin     UNKNOWN   . Tocotrienols     UNKNOWN   . Losartan Potassium Other (See Comments)    Causes constipation    Social History:  reports that he quit smoking about 15 years ago. His smoking use included cigarettes. He has a 30.00 pack-year smoking history. He has never used smokeless tobacco. He reports current alcohol use of about 1.0 standard drinks of alcohol per week. He reports that he does not use drugs.  Family History: Family History  Problem Relation Age of Onset  . Hypertension Mother   . Thyroid disease Mother   . Cholelithiasis Daughter   . Cholelithiasis Son   . Hypertension Maternal Grandmother   . Diabetes Maternal Grandmother   . Heart attack Neg Hx   .  Hyperlipidemia Neg Hx      Objective   Physical Exam: Blood pressure (!) 145/76, pulse (!) 108,  temperature (!) 100.7 F (38.2 C), temperature source Rectal, resp. rate (!) 28, height 6\' 2"  (1.88 m), weight 81.6 kg, SpO2 99 %.  Physical Exam Vitals and nursing note reviewed.  Constitutional:      Comments: comfortable  HENT:     Head: Normocephalic and atraumatic.     Mouth/Throat:     Comments: Wearing mask Eyes:     Conjunctiva/sclera: Conjunctivae normal.  Neck:     Comments: No jvd Cardiovascular:     Rate and Rhythm: Normal rate.     Pulses: Normal pulses.     Comments: No disposable stethoscope at bedside Pulmonary:     Effort: No respiratory distress.     Comments: Room air Abdominal:     General: Abdomen is flat. There is no distension.  Musculoskeletal:        General: No swelling or tenderness.     Comments: tremulous  Skin:    Comments: Bilateral lower extremity dry skin/chronic skin changes  Neurological:     Mental Status: He is alert. Mental status is at baseline.  Psychiatric:        Mood and Affect: Mood normal.        Behavior: Behavior normal.     Comments: Soft spoken     LABS on Admission: I have personally reviewed all the labs and imagings below    Basic Metabolic Panel: Recent Labs  Lab 12/11/19 1446  NA 137  K 4.3  CL 94*  CO2 27  GLUCOSE 106*  BUN 22  CREATININE 6.08*  CALCIUM 8.2*  MG 2.0  PHOS 3.9   Liver Function Tests: Recent Labs  Lab 12/11/19 1446  AST 68*  ALT 23  ALKPHOS 98  BILITOT 0.8  PROT 7.1  ALBUMIN 3.0*   No results for input(s): LIPASE, AMYLASE in the last 168 hours. No results for input(s): AMMONIA in the last 168 hours. CBC: Recent Labs  Lab 12/11/19 1551  WBC 5.5  NEUTROABS 4.0  HGB 9.6*  HCT 29.5*  MCV 109.7*  PLT 144*   Cardiac Enzymes: No results for input(s): CKTOTAL, CKMB, CKMBINDEX, TROPONINI in the last 168 hours. BNP: Invalid input(s): POCBNP CBG: No results for input(s): GLUCAP in the last 168 hours.  Radiological Exams on Admission:  DG Chest Portable 1 View  Result  Date: 12/11/2019 CLINICAL DATA:  Weakness. Additional history provided: Weakness/shortness of breath since yesterday. EXAM: PORTABLE CHEST 1 VIEW COMPARISON:  Chest radiograph 11/30/2019 FINDINGS: Unchanged position of a right IJ approach central venous catheter terminating in the upper right atrium. Overlying cardiac monitoring leads. Unchanged cardiomegaly. Again demonstrated is bilateral interstitial prominence. Ill-defined airspace opacities within the lung bases, increased on the left as compared to prior examination. No sizable pleural effusion or evidence of pneumothorax. No acute bony abnormality. IMPRESSION: Redemonstrated interstitial prominence. Ill-defined airspace opacities within the lung bases, increased on the left as compared to prior examination 11/30/2019. Findings may reflect edema and/or pneumonia. Unchanged cardiomegaly. Electronically Signed   By: Kellie Simmering DO   On: 12/11/2019 11:28      EKG: Independently reviewed.  Normal sinus rhythm with left bundle branch block similar to prior   A & P   Active Problems:   * No active hospital problems. *   1. SIRS/failure to thrive suspect secondary to COVID-19 a. Febrile 100.7, no leukocytosis b. Lactic acid 2.3-> 1.1  c. Follow-up D-dimer and CRP d. Hold off on dexamethasone and remdesivir at this time as patient tolerating room air e. Palliative care consult f. Tylenol for fever 2. Elevated troponin with extensive cardiac history (PEA arrest, combined systolic/diastolic end stage heart failure) a. Troponin 255->271 b. Asymptomatic with baseline LBBB on EKG c. Likely demand ischemia d. Cardiology consulted-no indication for cardiac intervention e. Palliative care consult f. Telemetry g. Poor prognosis, see Almyra Deforest, PA note from 12/10/19 and HPI above for complete details 3. 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 Had left thigh AV graft placed by Dr. Eden Lathe on 11/10/2019, unfortunately this thrombosed and underwent thrombectomy by Dr. Carlis Abbott on  12/14.  Eventually underwent a right SFA to common femoral vein AV loop graft by Dr. Donzetta Matters 12/02/19 b. Only completed 3 of 4 hours treatment today before transfer to [facility] c. Nephrology consult 4. Acute on chronic anemia a. Hemoglobin 9.6, baseline 11-12, hemoglobin 11.6 12/02/2019 b. No transfusion at this time 5. Combined systolic/diastolic end-stage heart failure a. Intolerant of beta-blockers in the past and other treatments b. No further options per cardiology c. Treat volume with HD d. I/O and daily weight 6. Chronic hypotension a. Continue midodrine 7. PAF on Coumadin and amiodarone a. HR 108 8. Chronic pain on tramadol 9. History of HIV on [name] 10. Prediabetes a. HbA1c 6.1 in 2019 b. Check HA1C 11. Tremulous with reported occasional alcohol use a. Unknown last drink b. Ethanol level 12. Ambulatory dysfunction a. Uses cane and walker at baseline b. PT/OT  DVT prophylaxis: heparin   Code Status: Prior  Diet: Renal/Carb modified Family Communication: Admission, patients condition and plan of care including tests being ordered have been discussed with the patient who indicates understanding and agrees with the plan and Code Status. Patient's wife was updated by phone Disposition Plan: The appropriate patient status for this patient is INPATIENT. Inpatient status is judged to be reasonable and necessary in order to provide the required intensity of service to ensure the patient's safety. The patient's presenting symptoms, physical exam findings, and initial radiographic and laboratory data in the context of their chronic comorbidities is felt to place them at high risk for further clinical deterioration. Furthermore, it is not anticipated that the patient will be medically stable for discharge from the hospital within 2 midnights of admission. The following factors support the patient status of inpatient.   " The patient's presenting symptoms include malaise/tremors/failure to  thrive. " The worrisome physical exam findings include Fever, lactic acidosis. " The initial radiographic and laboratory data are worrisome because of worsened pulmonary edema, COVID 19 +. " The chronic co-morbidities include ESRD on HD, End stage heart failure, HIV, PE arrest in the past, ambulatory dysfunction.   * I certify that at the point of admission it is my clinical judgment that the patient will require inpatient hospital care spanning beyond 2 midnights from the point of admission due to high intensity of service, high risk for further deterioration and high frequency of surveillance required.*    The medical decision making on this patient was of high complexity and the patient is at high risk for clinical deterioration, therefore this is a level 3  admission.  Consultants  . Nephrology . Cardiology . Palliative care  Procedures  . none  Time Spent on Admission: 75 minutes  Full PPE was donned during encounter    [name], DO [facility] 12/11/2019, 5:51 PM

## 2019-12-11 NOTE — ED Notes (Signed)
IV team here for start and blood draw.

## 2019-12-11 NOTE — ED Triage Notes (Signed)
From dialysis by [facility] with c/o tremors to UE.  Pt able to finish 3 out of 4 hours treatment.  Feels like something is pulling down from his neck.  States sx started approx a day ago only to UE.  Nothing makes the tremors worse or better, has not been eating well.  Does have intermittent emesis, no cough or fevers, no dyspnea.  Dialysis M/T/Th/ Sat.   No noted urinary or bowel changes.  Alert and oriented in NAD.

## 2019-12-11 NOTE — ED Provider Notes (Signed)
[facility] EMERGENCY DEPARTMENT Provider Note   CSN: [id number] Arrival date & time: 12/11/19  1033     History No chief complaint on file.   David [name] is a 79 y.o. male presenting for evaluation of generalized weakness and tremors.  Patient states he has not been feeling well for the past 5 days.  He reports gradual weakness as well as decreased appetite.  He developed tremors yesterday, states they are mostly of the upper extremities.  He was at dialysis today, completed 3 out of his 4 hours before he was sent to the ER due to his weakness.  He denies known fever, chest pain, shortness of breath, vomiting, abdominal pain, or abnormal bowel movements.  He produces minimal urine, states it has been normal.  He goes to dialysis Monday, Tuesday, Thursday, Saturday.  He has not missed any sessions.  Additional history obtained from chart review. Pt seen by cards yesterday, feels pt has severe end stage heart failure with minimal options for tx due to hypotension (despite midodrine).  Additional history of A. 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   HPI     Past Medical History:  Diagnosis Date  . 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 Acute on chronic systolic and diastolic heart failure, NYHA class 4 ([place])   . Anemia, iron deficiency 11/15/2011  . Arthritis    "hands, right knee, feet" (02/21/2018)  . Atrial fibrillation ([place])   . Cancer [facility])    hx of prostate; s/p radioactive seed implant 10/2009 Dr Janice Norrie  . Cardiac arrest ([place]) 02/17/2018  . CHF (congestive heart failure) ([place])   . Chronic combined systolic and diastolic heart failure, NYHA class 3 ([place]) 06/2010   felt to be secondary to hypertensive cardiomyopathy  . Chronic lower back pain   . CKD (chronic kidney disease) stage V requiring chronic dialysis  ([place])   . ESRD on dialysis [facility])    started 02/2018 [place]  . GERD (gastroesophageal reflux disease)   . Glaucoma    "I had surgery and dont have it any more"  . Gout    daily RX (02/21/2018)  . Heart murmur    "mild" per pt  . Hepatitis    years ago  . HIV (human immunodeficiency virus infection) ([place])   . HIV infection ([place])   . Hyperlipidemia   . Hypertension    followed by [facility] and Vascular (Dr Dani Gobble Croitoru)  . Hypertension   . Nonischemic cardiomyopathy ([place])   . Pneumonia 11/2017  . Prostate cancer ([place])   . Sinus bradycardia   . Sleep apnea    does not use a cpap  . Stroke [facility])    "mini stroke" years ago  . Wears glasses     Patient Active Problem List   Diagnosis Date Noted  . Acute respiratory failure with hypoxemia ([place]) 11/01/2019  . Hyperkalemia 09/02/2019  . Unspecified open wound of right upper arm, initial encounter 06/21/2019  . Shortness of breath 04/12/2019  . 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 Dependence on renal dialysis ([place]) 03/27/2019  . Hypertensive heart and chronic kidney disease with heart failure and with stage 5 chronic kidney disease, or end stage renal disease ([place]) 03/27/2019  . Mesenteric ischemia ([place])   . Colonic ulcer   . Right lower quadrant pain   . Abnormal CT  of the abdomen   . Ischemic colitis ([place])   . Colitis presumed infectious 03/09/2019  . Acute respiratory failure with hypoxia and hypercapnia ([place]) 03/04/2019  . Pruritus, unspecified 02/21/2019  . Acute pulmonary edema ([place]) 02/11/2019  . Pain, unspecified 01/21/2019  . Atrial fibrillation with RVR ([place]) 12/05/2018  . Paroxysmal atrial fibrillation ([place]) 11/16/2018  . LBBB (left bundle branch block) 11/16/2018  . Encounter for monitoring amiodarone therapy 11/16/2018  . Volume overload 11/01/2018  . Pulmonary edema 10/21/2018  . Gout 10/21/2018  . Hypertensive emergency 10/21/2018  . Chronic combined systolic and  diastolic heart failure, NYHA class 3 ([place]) 10/21/2018  . Leukocytosis 10/21/2018  . Chills (without fever) 09/24/2018  . Gastroesophageal reflux disease 08/28/2018  . Chronic anticoagulation 04/03/2018  . CAD (coronary artery disease) 04/03/2018  . Palliative care encounter 03/27/2018  . Atrial fibrillation, chronic ([place]) 03/25/2018  . Anemia in chronic kidney disease 03/12/2018  . Coagulation defect, unspecified ([place]) 03/12/2018  . Secondary hyperparathyroidism of renal origin ([place]) 03/12/2018  . Palliative care by specialist   . Advance care planning   . Goals of care, counseling/discussion   . Respiratory arrest ([place])   . Acute on chronic systolic heart failure ([place])   . Chronic obstructive pulmonary disease ([place])   . HIV (human immunodeficiency virus infection) ([place])   . ESRD (end stage renal disease) ([place])   . Acute respiratory failure with hypoxia ([place]) 01/21/2018  . Pneumonia   . Aspiration pneumonia of both lower lobes due to regurgitated food ([place])   . Sepsis due to pneumonia ([place]) 11/22/2017  . AIDS ([place]) 05/14/2014  . 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 Gastroparesis 05/06/2014  . Protein-calorie malnutrition, severe ([place]) 05/05/2014  . ESRD on hemodialysis ([place]) 05/02/2014  . Hemodialysis-associated hypotension 05/02/2014  . Leaking of conjunctival drainage bleb 05/01/2014  . POAG (primary open-angle glaucoma) 05/01/2014  . Loss of weight 04/29/2014  . Conjunctivitis 04/29/2014  . 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 Low back pain 04/09/2012  . Osteoarthritis of hip 12/29/2011  . Iron deficiency anemia 06/28/2011  . Macrocytic anemia 04/02/2011  . 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 Hyperlipidemia 11/30/2010  . Transient cerebral ischemia 11/30/2010  . HYPERGLYCEMIA 11/30/2010    Past Surgical History:  Procedure Laterality Date  . AV FISTULA PLACEMENT Right 10/13/2016   Procedure: ARTERIOVENOUS (AV) FISTULA CREATION;  Surgeon:  [name], MD;  Location: [facility];  Service: Vascular;  Laterality: Right;  . AV FISTULA PLACEMENT Left 02/25/2018   Procedure: INSERTION OF ARTERIOVENOUS (AV) GORE-TEX GRAFT LEFT UPPER ARM;  Surgeon: Angelia Mould, MD;  Location: [place];  Service: Vascular;  Laterality: Left;  . AV FISTULA PLACEMENT Right 08/07/2018   Procedure: Creation of right arm brachiocephalic Fistula;  Surgeon: Waynetta Sandy, MD;  Location: [place];  Service: Vascular;  Laterality: Right;  . AV FISTULA PLACEMENT Left 11/10/2019   Procedure: INSERTION OF ARTERIOVENOUS (AV) GORE-TEX GRAFT THIGH;  Surgeon: Elam Dutch, MD;  Location: [place];  Service: Vascular;  Laterality: Left;  . AV FISTULA PLACEMENT Right 12/02/2019   Procedure: INSERTION OF ARTERIOVENOUS (AV) GORE-TEX GRAFT RIGHT  THIGH;  Surgeon: Waynetta Sandy, MD;  Location: [facility];  Service: Vascular;  Laterality: Right;  . BASCILIC VEIN TRANSPOSITION Left 06/24/2015   Procedure: BASILIC VEIN TRANSPOSITION;  Surgeon: Mal Misty, MD;  Location: [facility];  Service: Vascular;  Laterality: Left;  . BIOPSY  03/14/2019   Procedure: BIOPSY;  Surgeon: Irene Shipper,  MD;  Location: [place];  Service: Endoscopy;;  . CATARACT EXTRACTION, BILATERAL Bilateral   . COLONOSCOPY WITH PROPOFOL N/A 03/14/2019   Procedure: COLONOSCOPY WITH PROPOFOL;  Surgeon: Irene Shipper, MD;  Location: [facility] ENDOSCOPY;  Service: Endoscopy;  Laterality: N/A;  . ESOPHAGOGASTRODUODENOSCOPY (EGD) WITH PROPOFOL N/A 05/05/2014   Procedure: ESOPHAGOGASTRODUODENOSCOPY (EGD) WITH PROPOFOL;  Surgeon: Missy Sabins, MD;  Location: [facility] ENDOSCOPY;  Service: Endoscopy;  Laterality: N/A;  . EYE SURGERY Bilateral    cataract surgery   . GLAUCOMA SURGERY Bilateral   . INSERTION OF ARTERIOVENOUS (AV) ARTEGRAFT ARM  10/09/2018   Procedure: INSERTION OF ARTERIOVENOUS (AV) 40mm x 41cm ARTEGRAFT LEFT UPPER ARM;  Surgeon: Waynetta Sandy, MD;  Location: [facility] [facility];  Service: Vascular;;  .  INSERTION OF DIALYSIS CATHETER Right 07/14/2019   Procedure: Insertion Of Dialysis Catheter;  Surgeon: Elam Dutch, MD;  Location: [facility] OR;  Service: Vascular;  Laterality: Right;  placed right Internal Jugular  . INSERTION PROSTATE RADIATION SEED    . IR FLUORO GUIDE CV LINE RIGHT  02/18/2018  . IR REMOVAL TUN CV CATH W/O FL  12/06/2018  . IR US GUIDE VASC ACCESS RIGHT  02/18/2018  . LEFT HEART CATH AND CORONARY ANGIOGRAPHY N/A 02/20/2018   Procedure: LEFT HEART CATH AND CORONARY ANGIOGRAPHY;  Surgeon: Jettie Booze, MD;  Location: [facility] CV LAB;;; 50% ostial OM2, 25% mLAD.  Marland Kitchen LEFT HEART CATH AND CORONARY ANGIOGRAPHY  2004   mildly depressed LV systolic fx EF 80%,[id number] coronaries/abdominal aorta/renal arteries.  . LOWER EXTREMITY ANGIOGRAM Left 11/17/2019   Procedure: Lower Extremity Fistulogram;  Surgeon: Marty Heck, MD;  Location: [facility];  Service: Vascular;  Laterality: Left;  . NM [place]  02/21/2010   No ischemia or infarction.  EF 27%.  Marland Kitchen RADIOACTIVE SEED IMPLANT  2010   prostate cancer  . REVISION OF ARTERIOVENOUS GORETEX GRAFT Right 07/11/2019   Procedure: REVISION OF ARTERIOVENOUS GORETEX GRAFT RIGHT ARM WITH ARTEGRAFT;  Surgeon: Serafina Mitchell, MD;  Location: [place];  Service: Vascular;  Laterality: Right;  . SHUNTOGRAM Right 07/14/2019   Procedure: Shuntogram;  Surgeon: Elam Dutch, MD;  Location: [facility];  Service: Vascular;  Laterality: Right;  . THROMBECTOMY AND REVISION OF ARTERIOVENTOUS (AV) GORETEX  GRAFT Right 07/14/2019   Procedure: THROMBECTOMY AND REVISION OF ARTERIOVENTOUS (AV) GORETEX  GRAFT;  Surgeon: Elam Dutch, MD;  Location: [facility];  Service: Vascular;  Laterality: Right;  . THROMBECTOMY W/ EMBOLECTOMY Left 02/26/2018   Procedure: THROMBECTOMY ARTERIOVENOUS GRAFT;  Surgeon: Angelia Mould, MD;  Location: [facility];  Service: Vascular;  Laterality: Left;  . THROMBECTOMY W/ EMBOLECTOMY Left 11/17/2019   Procedure:  THROMBECTOMY ARTERIOVENOUS GRAFT LEFT THIGH;  Surgeon: Marty Heck, MD;  Location: [facility];  Service: Vascular;  Laterality: Left;  . TRANSTHORACIC ECHOCARDIOGRAM  02/2012   [facility]) EF 45-50% with mild global HK.  Mild LVH,LA mod. dilated,mild-mod. 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 AI.  Marland Kitchen TRANSTHORACIC ECHOCARDIOGRAM  9/'18; 3/'19   a) Severely reduced EF.  25 from 30%.  GR 1 DD with diffuse ecchymosis. Mild AS & AI.  Mild LA/RA dilation; b) (post PEA arest):   Moderately dilated LV with moderate concentric virtually.  EF 15 to 20%.  GR 1 DD.  Paradoxical septal motion. Mild AI.  Mild LA dilation.  Poorly visualized RV.  Marland Kitchen TRANSTHORACIC ECHOCARDIOGRAM  02/2019   EF 20-25%.  Unable to assess diastolic function (  A. Fib).  No LV apical thrombus.  Mild AI.  Mildly reduced RV function, however poorly visualized.  Marland Kitchen ULTRASOUND GUIDANCE FOR VASCULAR ACCESS  02/20/2018   Procedure: Ultrasound Guidance For Vascular Access;  Surgeon: Jettie Booze, MD;  Location: [place] CV LAB;  Service: Cardiovascular;;  . UPPER EXTREMITY VENOGRAPHY N/A 07/08/2018   Procedure: UPPER EXTREMITY VENOGRAPHY;  Surgeon: Waynetta Sandy, MD;  Location: [place] CV LAB;  Service: Cardiovascular;  Laterality: N/A;  Bilateral       Family History  Problem Relation Age of Onset  . Hypertension Mother   . Thyroid disease Mother   . Cholelithiasis Daughter   . Cholelithiasis Son   . Hypertension Maternal Grandmother   . Diabetes Maternal Grandmother   . Heart attack Neg Hx   . Hyperlipidemia Neg Hx     Social History   Tobacco Use  . Smoking status: Former Smoker    Packs/day: 1.00    Years: 30.00    Pack years: 30.00    Types: Cigarettes    Quit date: 2006    Years since quitting: 15.0  . Smokeless tobacco: Never Used  Substance Use Topics  . Alcohol use: Yes    Alcohol/week: 1.0 standard drinks    Types: 1 Shots of liquor per week    Comment:  occasional  . Drug use: Never    Home Medications Prior to Admission medications   Medication Sig Start Date End Date Taking? Authorizing Provider  acetaminophen (TYLENOL) 500 MG tablet Take 1,000 mg by mouth every 6 (six) hours as needed for moderate pain.   Yes [provider]  allopurinol (ZYLOPRIM) 100 MG tablet 1 tablet by mouth 3x weekly after dialysis. 09/15/19  Yes [name], NP  amiodarone (PACERONE) 200 MG tablet Take 1 tablet (200 mg total) by mouth daily. 05/12/19  Yes Croitoru, Mihai, MD  bictegravir-emtricitabine-tenofovir AF ([name]) 50-200-25 MG TABS tablet Take 1 tablet by mouth daily. 09/29/19  Yes Tommy Medal, Lavell Islam, MD  brimonidine (ALPHAGAN) 0.15 % ophthalmic solution Place 1 drop into the left eye 2 (two) times daily. 11/15/17  Yes [provider]  dorzolamide-timolol (COSOPT) 22.3-6.8 MG/ML ophthalmic solution Place 1 drop into the left eye 2 (two) times daily. 12/17/17  Yes [provider]  latanoprost (XALATAN) 0.005 % ophthalmic solution Place 1 drop into the left eye at bedtime.    Yes [provider]  loratadine (CLARITIN) 10 MG tablet Take 10 mg by mouth daily.   Yes [provider]  midodrine (PROAMATINE) 10 MG tablet Take 1 tablet (10 mg total) by mouth as directed. Twice a day on the day of HD Tuesday, Thursday, Saturday (1 in AM and 1 at [place]). Take 1/2 tablet (5mg ) a day on non HD days Patient taking differently: Take 10 mg by mouth See admin instructions. Take 1 tablet (10 mg) by mouth 3 times daily on Tuesdays, Thursdays, & Saturdays. 04/22/19  Yes Leonie Man, MD  montelukast (SINGULAIR) 10 MG tablet Take 1 tablet (10 mg total) by mouth at bedtime. 09/12/19  Yes [name], NP  multivitamin (RENA-VIT) TABS tablet Take 1 tablet by mouth daily at 2 PM.    Yes [provider]  traMADol (ULTRAM) 50 MG tablet Take 1 tablet (50 mg total) by mouth every 6 (six) hours as needed. 12/02/19  Yes  Collins, Susette Racer, PA-C  VELPHORO 500 MG chewable tablet Chew 500 mg by mouth 3 (three) times daily with meals.  09/29/19  Yes [provider]  warfarin (COUMADIN) 5 MG tablet TAKE 1/2 TO 1 TABLET BY MOUTH DAILY AS DIRECTED BY COUMADIN CLINIC Patient taking differently: Take 2.5-5 mg by mouth See admin instructions. Take 1/2 to 1 tablet by mouth daily as directed by coumadin clinic; 2.5 mg on Mon and Fri, 5 ,g all other days 11/04/19  Yes Leonie Man, MD    Allergies    Dextromethorphan-guaifenesin, Tocotrienols, and Losartan potassium  Review of Systems   Review of Systems  Constitutional: Positive for appetite change and fatigue.  Allergic/Immunologic: Positive for immunocompromised state.  Neurological: Positive for tremors and weakness.  Hematological: Bruises/bleeds easily.  All other systems reviewed and are negative.   Physical Exam Updated Vital Signs BP (!) 145/76 (BP Location: Right Arm)   Pulse (!) 108   Temp (!) 100.7 F (38.2 C) (Rectal)   Resp (!) 28   Ht 6\' 2"  (1.88 m)   Wt 81.6 kg   SpO2 99%   BMI 23.11 kg/m   Physical Exam Vitals and nursing note reviewed.  Constitutional:      Appearance: He is ill-appearing.     Comments: Appears chronically ill  HENT:     Head: Normocephalic and atraumatic.  Eyes:     Conjunctiva/sclera: Conjunctivae normal.     Pupils: Pupils are equal, round, and reactive to light.  Cardiovascular:     Rate and Rhythm: Regular rhythm. Tachycardia present.     Pulses: Normal pulses.     Comments: Mildly tachycardic around 105 Pulmonary:     Effort: Pulmonary effort is normal. No respiratory distress.     Breath sounds: Normal breath sounds. No wheezing.  Abdominal:     General: There is no distension.     Palpations: Abdomen is soft. There is no mass.     Tenderness: There is no abdominal tenderness. There is no guarding or rebound.  Musculoskeletal:     Cervical back: Normal range of motion and neck supple.      Right lower leg: No edema.     Left lower leg: No edema.     Comments: No significant lower extremity edema. Weakness noted of bilateral lower extremities, however patient able to hold leg against gravity for 1 to 2 seconds bilaterally.  Pedal pulses intact.  No numbness. Upper extremities with bilateral intermittent tremors, not consistent with seizure-like activity.  Moving upper extremities without difficulty  Skin:    General: Skin is warm and dry.     Capillary Refill: Capillary refill takes less than 2 seconds.  Neurological:     Mental Status: He is alert and oriented to person, place, and time.     ED Results / Procedures / Treatments   Labs (all labs ordered are listed, but only abnormal results are displayed) Labs Reviewed  TSH - Abnormal; Notable for the following components:      Result Value   TSH 0.294 (*)    All other components within normal limits  BRAIN NATRIURETIC PEPTIDE - Abnormal; Notable for the following components:   B Natriuretic Peptide 632.3 (*)    All other components within normal limits  PROTIME-INR - Abnormal; Notable for the following components:   Prothrombin Time 15.9 (*)    INR 1.3 (*)    All other components within normal limits  POC SARS CORONAVIRUS 2 AG -  ED - Abnormal; Notable for the following components:   SARS Coronavirus 2 Ag POSITIVE (*)    All other components within normal  limits  TROPONIN I (HIGH SENSITIVITY) - Abnormal; Notable for the following components:   Troponin I (High Sensitivity) 255 (*)    All other components within normal limits  URINE CULTURE  LACTIC ACID, PLASMA  CBC WITH DIFFERENTIAL/PLATELET  URINALYSIS, ROUTINE W REFLEX MICROSCOPIC  LACTIC ACID, PLASMA  COMPREHENSIVE METABOLIC PANEL  PHOSPHORUS  MAGNESIUM  TROPONIN I (HIGH SENSITIVITY)  TROPONIN I (HIGH SENSITIVITY)    EKG EKG Interpretation  Date/Time:  Thursday December 11 2019 10:44:53 EST Ventricular Rate:  96 PR Interval:    QRS  Duration: 130 QT Interval:  400 QTC Calculation: 506 R Axis:   -63 Text Interpretation: Normal sinus rhythm Left bundle branch block Confirmed by Virgel Manifold [phone]) on 12/11/2019 11:17:34 AM   Radiology DG Chest Portable 1 View  Result Date: 12/11/2019 CLINICAL DATA:  Weakness. Additional history provided: Weakness/shortness of breath since yesterday. EXAM: PORTABLE CHEST 1 VIEW COMPARISON:  Chest radiograph 11/30/2019 FINDINGS: Unchanged position of a right IJ approach central venous catheter terminating in the upper right atrium. Overlying cardiac monitoring leads. Unchanged cardiomegaly. Again demonstrated is bilateral interstitial prominence. Ill-defined airspace opacities within the lung bases, increased on the left as compared to prior examination. No sizable pleural effusion or evidence of pneumothorax. No acute bony abnormality. IMPRESSION: Redemonstrated interstitial prominence. Ill-defined airspace opacities within the lung bases, increased on the left as compared to prior examination 11/30/2019. Findings may reflect edema and/or pneumonia. Unchanged cardiomegaly. Electronically Signed   By: Kellie Simmering DO   On: 12/11/2019 11:28    Procedures Procedures (including critical care time)  Medications Ordered in ED Medications  ondansetron (ZOFRAN) injection 4 mg (4 mg Intravenous Given 12/11/19 1505)  acetaminophen (TYLENOL) tablet 650 mg (650 mg Oral Given 12/11/19 1505)    ED Course  I have reviewed the triage vital signs and the nursing notes.  Pertinent labs & imaging results that were available during my care of the patient were reviewed by me and considered in my medical decision making (see chart for details).    MDM Rules/Calculators/A&P                      Patient presenting for evaluation of weakness and tremors.  Physical exam shows chronically ill.  Male.  Feels warm to the touch, concern for fever.  He is mildly tachycardic.  Considering his significant comorbidity,  concern for infection versus electrolyte abnormality versus cardiac issue.  He does not appear focally weak or to be having seizure-like activity, doubt stroke or CVA.  Will obtain labs, chest x-ray, urine and reassess.  Rectal temp 100.7.  Consider tremors due to fever.  Will treat with Tylenol.  Care delayed due to difficulty obtaining blood work.  Rapid Covid positive.  Troponin elevated at 255, this is elevated from his baseline of around 80.  This is likely due to demand.  Patient without chest pain without STEMI on EKG.  This is likely due to underlying medical process, will continue to await further blood work.   Pt signed out to [employer], PA-C for f/u on blood work. Pt will likely need to be admitted due to covid.   Final Clinical Impression(s) / ED Diagnoses Final diagnoses:  None    Rx / DC Orders ED Discharge Orders    None       Franchot Heidelberg, PA-C 12/11/19 1530    Virgel Manifold, MD 12/12/19 1012

## 2019-12-11 NOTE — ED Notes (Signed)
Dr. Wilson Singer and Sophia PA notified of Trop 255.

## 2019-12-12 ENCOUNTER — Ambulatory Visit: Payer: Medicare HMO | Admitting: Podiatry

## 2019-12-12 ENCOUNTER — Encounter (HOSPITAL_COMMUNITY): Payer: Self-pay | Admitting: Internal Medicine

## 2019-12-12 LAB — RENAL FUNCTION PANEL
Albumin: 2.7 g/dL — ABNORMAL LOW (ref 3.5–5.0)
Anion gap: 19 — ABNORMAL HIGH (ref 5–15)
BUN: 65 mg/dL — ABNORMAL HIGH (ref 8–23)
CO2: 23 mmol/L (ref 22–32)
Calcium: 8.1 mg/dL — ABNORMAL LOW (ref 8.9–10.3)
Chloride: 91 mmol/L — ABNORMAL LOW (ref 98–111)
Creatinine, Ser: 9.98 mg/dL — ABNORMAL HIGH (ref 0.61–1.24)
GFR calc Af Amer: 5 mL/min — ABNORMAL LOW (ref >60)
GFR calc non Af Amer: 4 mL/min — ABNORMAL LOW (ref >60)
Glucose, Bld: 114 mg/dL — ABNORMAL HIGH (ref 70–99)
Phosphorus: 7.7 mg/dL — ABNORMAL HIGH (ref 2.5–4.6)
Potassium: 5.5 mmol/L — ABNORMAL HIGH (ref 3.5–5.1)
Sodium: 133 mmol/L — ABNORMAL LOW (ref 135–145)

## 2019-12-12 LAB — CBC WITH DIFFERENTIAL/PLATELET
Abs Immature Granulocytes: 0.03 10*3/uL (ref 0.00–0.07)
Basophils Absolute: 0 10*3/uL (ref 0.0–0.1)
Basophils Relative: 0 %
Eosinophils Absolute: 0 10*3/uL (ref 0.0–0.5)
Eosinophils Relative: 0 %
HCT: 30.9 % — ABNORMAL LOW (ref 39.0–52.0)
Hemoglobin: 10 g/dL — ABNORMAL LOW (ref 13.0–17.0)
Immature Granulocytes: 1 %
Lymphocytes Relative: 18 %
Lymphs Abs: 1.1 10*3/uL (ref 0.7–4.0)
MCH: 35.5 pg — ABNORMAL HIGH (ref 26.0–34.0)
MCHC: 32.4 g/dL (ref 30.0–36.0)
MCV: 109.6 fL — ABNORMAL HIGH (ref 80.0–100.0)
Monocytes Absolute: 0.7 10*3/uL (ref 0.1–1.0)
Monocytes Relative: 12 %
Neutro Abs: 4.2 10*3/uL (ref 1.7–7.7)
Neutrophils Relative %: 69 %
Platelets: 192 10*3/uL (ref 150–400)
RBC: 2.82 MIL/uL — ABNORMAL LOW (ref 4.22–5.81)
RDW: 14.2 % (ref 11.5–15.5)
WBC: 6 10*3/uL (ref 4.0–10.5)
nRBC: 0 % (ref 0.0–0.2)

## 2019-12-12 LAB — CBC
HCT: 30 % — ABNORMAL LOW (ref 39.0–52.0)
Hemoglobin: 10.2 g/dL — ABNORMAL LOW (ref 13.0–17.0)
MCH: 35.9 pg — ABNORMAL HIGH (ref 26.0–34.0)
MCHC: 34 g/dL (ref 30.0–36.0)
MCV: 105.6 fL — ABNORMAL HIGH (ref 80.0–100.0)
Platelets: 185 K/uL (ref 150–400)
RBC: 2.84 MIL/uL — ABNORMAL LOW (ref 4.22–5.81)
RDW: 14.1 % (ref 11.5–15.5)
WBC: 4.2 K/uL (ref 4.0–10.5)
nRBC: 0.7 % — ABNORMAL HIGH (ref 0.0–0.2)

## 2019-12-12 LAB — CBG MONITORING, ED
Glucose-Capillary: 88 mg/dL (ref 70–99)
Glucose-Capillary: 91 mg/dL (ref 70–99)
Glucose-Capillary: 104 mg/dL — ABNORMAL HIGH (ref 70–99)

## 2019-12-12 LAB — PROTIME-INR
INR: 1.4 — ABNORMAL HIGH (ref 0.8–1.2)
Prothrombin Time: 17.2 seconds — ABNORMAL HIGH (ref 11.4–15.2)

## 2019-12-12 LAB — HEMOGLOBIN A1C
Hgb A1c MFr Bld: 5.7 % — ABNORMAL HIGH (ref 4.8–5.6)
Mean Plasma Glucose: 116.89 mg/dL

## 2019-12-12 LAB — GLUCOSE, CAPILLARY: Glucose-Capillary: 115 mg/dL — ABNORMAL HIGH (ref 70–99)

## 2019-12-12 LAB — PHOSPHORUS: Phosphorus: 6.4 mg/dL — ABNORMAL HIGH (ref 2.5–4.6)

## 2019-12-12 LAB — C-REACTIVE PROTEIN: CRP: 22.9 mg/dL — ABNORMAL HIGH (ref <1.0)

## 2019-12-12 LAB — D-DIMER, QUANTITATIVE: D-Dimer, Quant: 7.31 ug/mL-FEU — ABNORMAL HIGH (ref 0.00–0.50)

## 2019-12-12 LAB — MAGNESIUM: Magnesium: 2.2 mg/dL (ref 1.7–2.4)

## 2019-12-12 LAB — FERRITIN: Ferritin: 1810 ng/mL — ABNORMAL HIGH (ref 24–336)

## 2019-12-12 MED ORDER — SODIUM CHLORIDE 0.9 % IV SOLN: 100.0000 mL | INTRAVENOUS | Status: DC | PRN

## 2019-12-12 MED ORDER — INSULIN DETEMIR 100 UNIT/ML ~~LOC~~ SOLN
0.0750 [IU]/kg | Freq: Two times a day (BID) | SUBCUTANEOUS | Status: DC
  Administered 2019-12-12 – 2019-12-16 (×9): 6 [IU] via SUBCUTANEOUS
  Filled 2019-12-12 (×12): qty 0.06

## 2019-12-12 MED ORDER — INSULIN ASPART 100 UNIT/ML ~~LOC~~ SOLN
0.0000 [IU] | SUBCUTANEOUS | Status: DC
  Administered 2019-12-13 – 2019-12-14 (×4): 1 [IU] via SUBCUTANEOUS
  Administered 2019-12-15: 2 [IU] via SUBCUTANEOUS
  Administered 2019-12-15: 3 [IU] via SUBCUTANEOUS
  Administered 2019-12-16 (×2): 2 [IU] via SUBCUTANEOUS
  Administered 2019-12-16: 1 [IU] via SUBCUTANEOUS
  Administered 2019-12-16: 7 [IU] via SUBCUTANEOUS
  Administered 2019-12-16: 2 [IU] via SUBCUTANEOUS
  Administered 2019-12-17: 1 [IU] via SUBCUTANEOUS
  Administered 2019-12-17: 7 [IU] via SUBCUTANEOUS
  Administered 2019-12-17 – 2019-12-18 (×2): 2 [IU] via SUBCUTANEOUS
  Administered 2019-12-18: 1 [IU] via SUBCUTANEOUS
  Administered 2019-12-19: 2 [IU] via SUBCUTANEOUS
  Administered 2019-12-19 – 2019-12-20 (×3): 1 [IU] via SUBCUTANEOUS
  Administered 2019-12-20: 2 [IU] via SUBCUTANEOUS
  Administered 2019-12-20 (×3): 1 [IU] via SUBCUTANEOUS
  Administered 2019-12-21 (×2): 2 [IU] via SUBCUTANEOUS
  Administered 2019-12-21 (×2): 1 [IU] via SUBCUTANEOUS
  Administered 2019-12-21 – 2019-12-22 (×2): 2 [IU] via SUBCUTANEOUS
  Administered 2019-12-22: 1 [IU] via SUBCUTANEOUS
  Administered 2019-12-22: 2 [IU] via SUBCUTANEOUS
  Administered 2019-12-22: 1 [IU] via SUBCUTANEOUS
  Administered 2019-12-22: 2 [IU] via SUBCUTANEOUS
  Administered 2019-12-23: 3 [IU] via SUBCUTANEOUS
  Administered 2019-12-23: 2 [IU] via SUBCUTANEOUS
  Administered 2019-12-23: 5 [IU] via SUBCUTANEOUS
  Administered 2019-12-23 (×3): 2 [IU] via SUBCUTANEOUS
  Administered 2019-12-23: 5 [IU] via SUBCUTANEOUS
  Administered 2019-12-24: 3 [IU] via SUBCUTANEOUS
  Administered 2019-12-24: 2 [IU] via SUBCUTANEOUS
  Administered 2019-12-24: 3 [IU] via SUBCUTANEOUS
  Administered 2019-12-24 – 2019-12-25 (×3): 2 [IU] via SUBCUTANEOUS
  Administered 2019-12-25 (×2): 1 [IU] via SUBCUTANEOUS
  Administered 2019-12-25 (×2): 2 [IU] via SUBCUTANEOUS
  Administered 2019-12-26 (×3): 1 [IU] via SUBCUTANEOUS

## 2019-12-12 MED ORDER — LIDOCAINE-PRILOCAINE 2.5-2.5 % EX CREA
1.0000 "application " | TOPICAL_CREAM | CUTANEOUS | Status: DC | PRN
  Filled 2019-12-12: qty 5

## 2019-12-12 MED ORDER — SODIUM CHLORIDE 0.9 % IV SOLN
200.0000 mg | Freq: Once | INTRAVENOUS | Status: AC
  Administered 2019-12-12: 200 mg via INTRAVENOUS
  Filled 2019-12-12: qty 200

## 2019-12-12 MED ORDER — PENTAFLUOROPROP-TETRAFLUOROETH EX AERO
1.0000 "application " | INHALATION_SPRAY | CUTANEOUS | Status: DC | PRN
  Filled 2019-12-12: qty 116

## 2019-12-12 MED ORDER — CALCITRIOL 0.25 MCG PO CAPS
0.5000 ug | ORAL_CAPSULE | ORAL | Status: DC
  Administered 2019-12-13 – 2019-12-16 (×2): 0.5 ug via ORAL
  Filled 2019-12-12 (×2): qty 2

## 2019-12-12 MED ORDER — ALTEPLASE 2 MG IJ SOLR: 2.0000 mg | Freq: Once | INTRAMUSCULAR | Status: DC | PRN

## 2019-12-12 MED ORDER — HEPARIN SODIUM (PORCINE) 1000 UNIT/ML DIALYSIS
1000.0000 [IU] | INTRAMUSCULAR | Status: DC | PRN
  Administered 2019-12-15 – 2019-12-17 (×2): 1000 [IU] via INTRAVENOUS_CENTRAL
  Filled 2019-12-12: qty 1

## 2019-12-12 MED ORDER — SODIUM CHLORIDE 0.9 % IV SOLN
100.0000 mg | INTRAVENOUS | Status: AC
  Administered 2019-12-13 – 2019-12-16 (×4): 100 mg via INTRAVENOUS
  Filled 2019-12-12 (×4): qty 20

## 2019-12-12 MED ORDER — WARFARIN - PHARMACIST DOSING INPATIENT: Freq: Every day | Status: DC

## 2019-12-12 MED ORDER — DEXAMETHASONE 6 MG PO TABS
6.0000 mg | ORAL_TABLET | Freq: Every day | ORAL | Status: DC
  Administered 2019-12-12 – 2019-12-15 (×4): 6 mg via ORAL
  Filled 2019-12-12: qty 2
  Filled 2019-12-12 (×3): qty 1

## 2019-12-12 MED ORDER — SIMETHICONE 80 MG PO CHEW
80.0000 mg | CHEWABLE_TABLET | Freq: Once | ORAL | Status: AC
  Administered 2019-12-12: 80 mg via ORAL
  Filled 2019-12-12: qty 1

## 2019-12-12 MED ORDER — CHLORHEXIDINE GLUCONATE CLOTH 2 % EX PADS
6.0000 | MEDICATED_PAD | Freq: Every day | CUTANEOUS | Status: DC
  Administered 2019-12-13 – 2019-12-16 (×4): 6 via TOPICAL

## 2019-12-12 MED ORDER — WARFARIN SODIUM 5 MG PO TABS
5.0000 mg | ORAL_TABLET | Freq: Once | ORAL | Status: AC
  Administered 2019-12-12: 5 mg via ORAL
  Filled 2019-12-12: qty 1

## 2019-12-12 MED ORDER — LIDOCAINE HCL (PF) 1 % IJ SOLN
5.0000 mL | INTRAMUSCULAR | Status: DC | PRN
  Filled 2019-12-12: qty 5

## 2019-12-12 NOTE — Progress Notes (Signed)
Inpatient Diabetes Program Recommendations  AACE/ADA: New Consensus Statement on Inpatient Glycemic Control   Target Ranges:  Prepandial:   less than 140 mg/dL      Peak postprandial:   less than 180 mg/dL (1-2 hours)      Critically ill patients:  140 - 180 mg/dL  Results for MONTRE, HARBOR (MRN [id number]) as of 12/12/2019 11:25  Ref. Range 12/12/2019 09:50  Glucose-Capillary Latest Ref Range: 70 - 99 mg/dL 88  Results for KENITH, TRICKEL (MRN [id number]) as of 12/12/2019 11:25  Ref. Range 12/11/2019 14:46  Glucose Latest Ref Range: 70 - 99 mg/dL 106 (H)    Review of Glycemic Control  Diabetes history: PreDM Outpatient Diabetes medications: None Current orders for Inpatient glycemic control: Levemir 6 units BID, Novolog 0-9 units Q4H; Decadron 6 mg daily  Inpatient Diabetes Program Recommendations:    Insulin-Basal: Would recommend discontinuing Levemir at this time. Could consider re-ordering low dose Levemir if glucose is consistently greater than 180 mg/dl with Novolog correction Q4H.  NOTE: Noted consult for Diabetes Coordinator per COVID Glycemic control order set. Chart reviewed. Noted patient has prediabetes hx, ESRD w/HD, and CHF. Current glucose 88 mg/dl at 9:50 am today. At this time, would recommend discontinuing Levemir and using Novolog correction Q4H. If glucose is consistently greater than 180 mg/dl with Novolog correction, then could re-order low dose Levemir.  Thanks, David Alderman, RN, MSN, CDE Diabetes Coordinator Inpatient Diabetes Program [phone] (Team Pager from 8am to 5pm)

## 2019-12-12 NOTE — Progress Notes (Signed)
He has had a steady decline. Sorry to hear about COVID. Hope he does well and hope you had your N95 on as usual.

## 2019-12-12 NOTE — ED Notes (Signed)
ED TO INPATIENT HANDOFF REPORT  ED Nurse Name and Phone #: [phone]  S Name/Age/Gender [facility] 79 y.o. male Room/Bed: 008C/008C  Code Status   Code Status: Full Code  Home/SNF/Other Home Patient oriented to: self, place, time and situation Is this baseline? Yes      Chief Complaint COVID-19 [U07.1]  Triage Note From dialysis by [facility] with c/o tremors to UE.  Pt able to finish 3 out of 4 hours treatment.  Feels like something is pulling down from his neck.  States sx started approx a day ago only to UE.  Nothing makes the tremors worse or better, has not been eating well.  Does have intermittent emesis, no cough or fevers, no dyspnea.  Dialysis M/T/Th/ Sat.   No noted urinary or bowel changes.  Alert and oriented in NAD.      Allergies Allergies  Allergen Reactions  . Dextromethorphan-Guaifenesin     UNKNOWN   . Tocotrienols     UNKNOWN   . Losartan Potassium Other (See Comments)    Causes constipation    Level of Care/Admitting Diagnosis ED Disposition    ED Disposition Condition [facility] Area: [place] [[id number]]  Level of Care: Progressive [102]  Admit to Progressive based on following criteria: RESPIRATORY PROBLEMS hypoxemic/hypercapnic respiratory failure that is responsive to NIPPV (BiPAP) or High Flow Nasal Cannula (6-80 lpm). Frequent assessment/intervention, no > Q2 hrs < Q4 hrs, to maintain oxygenation and pulmonary hygiene.  Covid Evaluation: Confirmed COVID Positive  Diagnosis: COVID-19 [[id number]]  Admitting Physician: [name] [[id number]]  Attending Physician: [name] [[id number]]  Estimated length of stay: past midnight tomorrow  Certification:: I certify this patient will need inpatient services for at least 2 midnights       B Medical/Surgery History Past Medical History:  Diagnosis Date  . 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 Acute on chronic systolic and diastolic heart failure, NYHA  class 4 ([place])   . Anemia, iron deficiency 11/15/2011  . Arthritis    "hands, right knee, feet" (02/21/2018)  . Atrial fibrillation ([place])   . Cancer [facility])    hx of prostate; s/p radioactive seed implant 10/2009 Dr Janice Norrie  . Cardiac arrest ([place]) 02/17/2018  . CHF (congestive heart failure) ([place])   . 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 Chronic lower back pain   . CKD (chronic kidney disease) stage V requiring chronic dialysis ([place])   . ESRD on dialysis [facility])    started 02/2018 [place]  . GERD (gastroesophageal reflux disease)   . Glaucoma    "I had surgery and dont have it any more"  . Gout    daily RX (02/21/2018)  . Heart murmur    "mild" per pt  . Hepatitis    years ago  . HIV (human immunodeficiency virus infection) ([place])   . HIV infection ([place])   . Hyperlipidemia   . Hypertension    followed by [facility] and Vascular (Dr Dani Gobble Croitoru)  . Hypertension   . Nonischemic cardiomyopathy ([place])   . Pneumonia 11/2017  . Prostate cancer ([place])   . Sinus bradycardia   . Sleep apnea    does not use a cpap  . Stroke [facility])    "mini stroke" years ago  . Wears glasses    Past Surgical History:  Procedure Laterality Date  .  AV FISTULA PLACEMENT Right 10/13/2016   Procedure: ARTERIOVENOUS (AV) FISTULA CREATION;  Surgeon: [name], MD;  Location: [place];  Service: Vascular;  Laterality: Right;  . AV FISTULA PLACEMENT Left 02/25/2018   Procedure: INSERTION OF ARTERIOVENOUS (AV) GORE-TEX GRAFT LEFT UPPER ARM;  Surgeon: Angelia Mould, MD;  Location: [place];  Service: Vascular;  Laterality: Left;  . AV FISTULA PLACEMENT Right 08/07/2018   Procedure: Creation of right arm brachiocephalic Fistula;  Surgeon: Waynetta Sandy, MD;  Location: [place];  Service: Vascular;  Laterality: Right;  . AV FISTULA PLACEMENT Left 11/10/2019   Procedure:  INSERTION OF ARTERIOVENOUS (AV) GORE-TEX GRAFT THIGH;  Surgeon: Elam Dutch, MD;  Location: [place];  Service: Vascular;  Laterality: Left;  . AV FISTULA PLACEMENT Right 12/02/2019   Procedure: INSERTION OF ARTERIOVENOUS (AV) GORE-TEX GRAFT RIGHT  THIGH;  Surgeon: Waynetta Sandy, MD;  Location: [place];  Service: Vascular;  Laterality: Right;  . BASCILIC VEIN TRANSPOSITION Left 06/24/2015   Procedure: BASILIC VEIN TRANSPOSITION;  Surgeon: Mal Misty, MD;  Location: [place];  Service: Vascular;  Laterality: Left;  . BIOPSY  03/14/2019   Procedure: BIOPSY;  Surgeon: Irene Shipper, MD;  Location: [place];  Service: Endoscopy;;  . CATARACT EXTRACTION, BILATERAL Bilateral   . COLONOSCOPY WITH PROPOFOL N/A 03/14/2019   Procedure: COLONOSCOPY WITH PROPOFOL;  Surgeon: Irene Shipper, MD;  Location: [facility] ENDOSCOPY;  Service: Endoscopy;  Laterality: N/A;  . ESOPHAGOGASTRODUODENOSCOPY (EGD) WITH PROPOFOL N/A 05/05/2014   Procedure: ESOPHAGOGASTRODUODENOSCOPY (EGD) WITH PROPOFOL;  Surgeon: Missy Sabins, MD;  [facility] ENDOSCOPY;  Service: Endoscopy;  Laterality: N/A;  . EYE SURGERY Bilateral    cataract surgery   . GLAUCOMA SURGERY Bilateral   . INSERTION OF ARTERIOVENOUS (AV) ARTEGRAFT ARM  10/09/2018   Procedure: INSERTION OF ARTERIOVENOUS (AV) 40m x 41cm ARTEGRAFT LEFT UPPER ARM;  Surgeon: CWaynetta Sandy MD;  [place]  Service: Vascular;;  . INSERTION OF DIALYSIS CATHETER Right 07/14/2019   Procedure: Insertion Of Dialysis Catheter;  Surgeon: FElam Dutch MD;  [facility]  Service: Vascular;  Laterality: Right;  placed right Internal Jugular  . INSERTION PROSTATE RADIATION SEED    . IR FLUORO GUIDE CV LINE RIGHT  02/18/2018  . IR REMOVAL TUN CV CATH W/O FL  12/06/2018  . IR [place]ASC ACCESS RIGHT  02/18/2018  . LEFT HEART CATH AND CORONARY ANGIOGRAPHY N/A 02/20/2018   Procedure: LEFT HEART CATH AND CORONARY ANGIOGRAPHY;  Surgeon: VJettie Booze MD;   [place];;; 50% ostial OM2, 25% mLAD.  .Marland KitchenLEFT HEART CATH AND CORONARY ANGIOGRAPHY  2004   mildly depressed LV systolic fx EF 516%,XWRUEAcoronaries/abdominal aorta/renal arteries.  . LOWER EXTREMITY ANGIOGRAM Left 11/17/2019   Procedure: Lower Extremity Fistulogram;  Surgeon: CMarty Heck MD;  [place]  Service: Vascular;  Laterality: Left;  . NM [place] 02/21/2010   No ischemia or infarction.  EF 27%.  .Marland KitchenRADIOACTIVE SEED IMPLANT  2010   prostate cancer  . REVISION OF ARTERIOVENOUS GORETEX GRAFT Right 07/11/2019   Procedure: REVISION OF ARTERIOVENOUS GORETEX GRAFT RIGHT ARM WITH ARTEGRAFT;  Surgeon: BSerafina Mitchell MD;  [place]  Service: Vascular;  Laterality: Right;  . SHUNTOGRAM Right 07/14/2019   Procedure: Shuntogram;  Surgeon: FElam Dutch MD;  [place]  Service: Vascular;  Laterality: Right;  . THROMBECTOMY AND REVISION OF ARTERIOVENTOUS (AV) GORETEX  GRAFT Right 07/14/2019   Procedure: THROMBECTOMY AND REVISION OF ARTERIOVENTOUS (AV) GORETEX  GRAFT;  Surgeon: Elam Dutch, MD;  Location: [place];  Service: Vascular;  Laterality: Right;  . THROMBECTOMY W/ EMBOLECTOMY Left 02/26/2018   Procedure: THROMBECTOMY ARTERIOVENOUS GRAFT;  Surgeon: Angelia Mould, MD;  Location: [place];  Service: Vascular;  Laterality: Left;  . THROMBECTOMY W/ EMBOLECTOMY Left 11/17/2019   Procedure: THROMBECTOMY ARTERIOVENOUS GRAFT LEFT THIGH;  Surgeon: Marty Heck, MD;  Location: [place];  Service: Vascular;  Laterality: Left;  . TRANSTHORACIC ECHOCARDIOGRAM  02/2012   [facility]) EF 45-50% with mild global HK.  Mild LVH,LA mod. dilated,mild-mod. 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 AI.  Marland Kitchen TRANSTHORACIC ECHOCARDIOGRAM  9/'18; 3/'19   a) Severely reduced EF.  25 from 30%.  GR 1 DD with diffuse ecchymosis. Mild AS & AI.  Mild LA/RA dilation; b) (post PEA arest):   Moderately dilated LV with moderate  concentric virtually.  EF 15 to 20%.  GR 1 DD.  Paradoxical septal motion. Mild AI.  Mild LA dilation.  Poorly visualized RV.  Marland Kitchen TRANSTHORACIC ECHOCARDIOGRAM  02/2019   EF 20-25%.  Unable to assess diastolic function (A. Fib).  No LV apical thrombus.  Mild AI.  Mildly reduced RV function, however poorly visualized.  Marland Kitchen ULTRASOUND GUIDANCE FOR VASCULAR ACCESS  02/20/2018   Procedure: Ultrasound Guidance For Vascular Access;  Surgeon: Jettie Booze, MD;  Location: [place] CV LAB;  Service: Cardiovascular;;  . UPPER EXTREMITY VENOGRAPHY N/A 07/08/2018   Procedure: UPPER EXTREMITY VENOGRAPHY;  Surgeon: Waynetta Sandy, MD;  Location: [place] CV LAB;  Service: Cardiovascular;  Laterality: N/A;  Bilateral     A IV Location/Drains/Wounds Patient Lines/Drains/Airways Status   Active Line/Drains/Airways    Name:   Placement date:   Placement time:   Site:   Days:   Peripheral IV 11/10/19 Right Hand   11/10/19    0853    Hand   32   Peripheral IV 12/11/19 Left;Anterior Forearm   12/11/19    1219    Forearm   1   Fistula / Graft Left Thigh Arteriovenous vein graft   11/10/19    1103    Thigh   32   Hemodialysis Catheter Right Internal jugular Double lumen Permanent (Tunneled)   07/14/19    1803    Internal jugular   151   External Urinary Catheter   12/11/19    1100    --   1   Incision (Closed) 07/14/19 Neck Right;Anterior   07/14/19    1827     151   Incision (Closed) 07/14/19 Arm Right   07/14/19    1827     151   Incision (Closed) 11/10/19 Thigh Left   11/10/19    0947     32   Incision (Closed) 11/17/19 Groin Left   11/17/19    1603     25   Incision (Closed) 12/02/19 Thigh Right   12/02/19    1328     10          Intake/Output Last 24 hours  Intake/Output Summary (Last 24 hours) at 12/12/2019 1913 Last data filed at 12/12/2019 1253 Gross per 24 hour  Intake 250 ml  Output --  Net 250 ml    Labs/Imaging Results for orders placed or performed during the hospital  encounter of 12/11/19 (from the past 48 hour(s))  TSH     Status:  Abnormal   Collection Time: 12/11/19 10:49 AM  Result Value Ref Range   TSH 0.294 (L) 0.350 - 4.500 uIU/mL    Comment: Performed by a 3rd Generation assay with a functional sensitivity of <=0.01 uIU/mL. Performed at [facility], [place] [address] [place], [place] [address]   POC SARS Coronavirus 2 Ag-ED - Nasal Swab (BD Veritor Kit)     Status: Abnormal   Collection Time: 12/11/19 11:30 AM  Result Value Ref Range   SARS Coronavirus 2 Ag POSITIVE (A) NEGATIVE    Comment: (NOTE) SARS-CoV-2 antigen PRESENT. Positive results indicate the presence of viral antigens, but clinical correlation with patient history and other diagnostic information is necessary to determine patient infection status.  Positive results do not rule out bacterial infection or co-infection  with other viruses. False positive results are rare but can occur, and confirmatory RT-PCR testing may be appropriate in some circumstances. The expected result is Negative. Fact Sheet for Patients: PodPark.tn Fact Sheet for Providers: GiftContent.is  This test is not yet approved or cleared by the [place] FDA and  has been authorized for detection and/or diagnosis of SARS-CoV-2 by FDA under an Emergency Use Authorization (EUA).  This EUA will remain in effect (meaning this test can be used) for the duration of  the COVID-19 declaration under Section 564(b)(1) of the Act, 21 U.S.C. section 360bbb-3(b)(1), unless the a uthorization is terminated or revoked sooner.   Lactic acid, plasma     Status: Abnormal   Collection Time: 12/11/19 12:30 PM  Result Value Ref Range   Lactic Acid, Venous 2.3 (HH) 0.5 - 1.9 mmol/L    Comment: CRITICAL RESULT CALLED TO, READ BACK BY AND VERIFIED WITH: M.COFFEY,RN 1514 12/11/2019 CLARK,S Performed at [facility], [place] [address], [place], [place] [address]    ABO/Rh     Status: None   Collection Time: 12/11/19 12:30 PM  Result Value Ref Range   ABO/RH(D)      O POS Performed at [facility] [address] [place], [place] [address]   Lactic acid, plasma     Status: None   Collection Time: 12/11/19  1:31 PM  Result Value Ref Range   Lactic Acid, Venous 1.1 0.5 - 1.9 mmol/L    Comment: Performed at [facility] [address] [place], [place] [address]  Brain natriuretic peptide     Status: Abnormal   Collection Time: 12/11/19  1:31 PM  Result Value Ref Range   B Natriuretic Peptide 632.3 (H) 0.0 - 100.0 pg/mL    Comment: Performed at [facility] [address], [place], [place] [address]  Troponin I (High Sensitivity)     Status: Abnormal   Collection Time: 12/11/19  1:31 PM  Result Value Ref Range   Troponin I (High Sensitivity) 255 (HH) <18 ng/L    Comment: CRITICAL RESULT CALLED TO, READ BACK BY AND VERIFIED WITH: NEWNAN,K RN @1438  ON [id number] BY FLEMINGS (NOTE) Elevated high sensitivity troponin I (hsTnI) values and significant  changes across serial measurements may suggest ACS but many other  chronic and acute conditions are known to elevate hsTnI results.  Refer to the Links section for chest pain algorithms and additional  guidance. Performed at [facility], [place] [address] [place], [place] [address]   Protime-INR     Status: Abnormal   Collection Time: 12/11/19  2:30 PM  Result Value Ref Range   Prothrombin Time 15.9 (H)  11.4 - 15.2 seconds   INR 1.3 (H) 0.8 - 1.2    Comment: (NOTE) INR goal varies based on device and disease states. Performed at [facility], [place] [address] [place], [place] [address]   Comprehensive metabolic panel     Status: Abnormal   Collection Time: 12/11/19  2:46 PM  Result Value Ref Range   Sodium 137 135 - 145 mmol/L   Potassium 4.3 3.5 - 5.1 mmol/L   Chloride 94 (L) 98 - 111 mmol/L   CO2 27 22 - 32 mmol/L   Glucose, Bld 106 (H) 70 - 99 mg/dL   BUN  22 8 - 23 mg/dL   Creatinine, Ser 6.08 (H) 0.61 - 1.24 mg/dL   Calcium 8.2 (L) 8.9 - 10.3 mg/dL   Total Protein 7.1 6.5 - 8.1 g/dL   Albumin 3.0 (L) 3.5 - 5.0 g/dL   AST 68 (H) 15 - 41 U/L   ALT 23 0 - 44 U/L   Alkaline Phosphatase 98 38 - 126 U/L   Total Bilirubin 0.8 0.3 - 1.2 mg/dL   GFR calc non Af Amer 8 (L) >60 mL/min   GFR calc Af Amer 9 (L) >60 mL/min   Anion gap 16 (H) 5 - 15    Comment: Performed at [facility], [place] [address], [place], [place] [address]  Phosphorus     Status: None   Collection Time: 12/11/19  2:46 PM  Result Value Ref Range   Phosphorus 3.9 2.5 - 4.6 mg/dL    Comment: Performed at [facility] [address], [place], [place] [address]  Magnesium     Status: None   Collection Time: 12/11/19  2:46 PM  Result Value Ref Range   Magnesium 2.0 1.7 - 2.4 mg/dL    Comment: Performed at [facility], [place] [address], [place], [place] [address]  Troponin I (High Sensitivity)     Status: Abnormal   Collection Time: 12/11/19  3:01 PM  Result Value Ref Range   Troponin I (High Sensitivity) 271 (HH) <18 ng/L    Comment: CRITICAL VALUE NOTED.  VALUE IS CONSISTENT WITH PREVIOUSLY REPORTED AND CALLED VALUE. (NOTE) Elevated high sensitivity troponin I (hsTnI) values and significant  changes across serial measurements may suggest ACS but many other  chronic and acute conditions are known to elevate hsTnI results.  Refer to the Links section for chest pain algorithms and additional  guidance. Performed at [facility], [place] [address], [place], [place] [address]   CBC with Differential     Status: Abnormal   Collection Time: 12/11/19  3:51 PM  Result Value Ref Range   WBC 5.5 4.0 - 10.5 K/uL   RBC 2.69 (L) 4.22 - 5.81 MIL/uL   Hemoglobin 9.6 (L) 13.0 - 17.0 g/dL   HCT 29.5 (L) 39.0 - 52.0 %   MCV 109.7 (H) 80.0 - 100.0 fL   MCH 35.7 (H) 26.0 - 34.0 pg   MCHC 32.5 30.0 - 36.0 g/dL   RDW 14.1 11.5 - 15.5 %   Platelets 144 (L) 150 - 400  K/uL   nRBC 0.0 0.0 - 0.2 %   Neutrophils Relative % 73 %   Neutro Abs 4.0 1.7 - 7.7 K/uL   Lymphocytes Relative 14 %   Lymphs Abs 0.8 0.7 - 4.0 K/uL   Monocytes Relative 12 %   Monocytes Absolute 0.7 0.1 - 1.0 K/uL   Eosinophils Relative 0 %  Eosinophils Absolute 0.0 0.0 - 0.5 K/uL   Basophils Relative 0 %   Basophils Absolute 0.0 0.0 - 0.1 K/uL   Immature Granulocytes 1 %   Abs Immature Granulocytes 0.03 0.00 - 0.07 K/uL    Comment: Performed at [facility], [place] [address], [place], [place] [address]  Ethanol     Status: None   Collection Time: 12/11/19  7:09 PM  Result Value Ref Range   Alcohol, Ethyl (B) <10 <10 mg/dL    Comment: (NOTE) Lowest detectable limit for serum alcohol is 10 mg/dL. For medical purposes only. Performed at [facility], [place] [address] [place], [place] [address]   CBC     Status: Abnormal   Collection Time: 12/11/19  8:24 PM  Result Value Ref Range   WBC 4.9 4.0 - 10.5 K/uL   RBC 2.65 (L) 4.22 - 5.81 MIL/uL   Hemoglobin 9.6 (L) 13.0 - 17.0 g/dL   HCT 29.3 (L) 39.0 - 52.0 %   MCV 110.6 (H) 80.0 - 100.0 fL   MCH 36.2 (H) 26.0 - 34.0 pg   MCHC 32.8 30.0 - 36.0 g/dL   RDW 14.4 11.5 - 15.5 %   Platelets 161 150 - 400 K/uL   nRBC 0.0 0.0 - 0.2 %    Comment: Performed at [facility], [place] [address] [place], [place] [address]  Creatinine, serum     Status: Abnormal   Collection Time: 12/11/19  8:24 PM  Result Value Ref Range   Creatinine, Ser 7.06 (H) 0.61 - 1.24 mg/dL   GFR calc non Af Amer 7 (L) >60 mL/min   GFR calc Af Amer 8 (L) >60 mL/min    Comment: Performed at [facility] [address], [place], [place] [address]  C-reactive protein     Status: Abnormal   Collection Time: 12/11/19  8:24 PM  Result Value Ref Range   CRP 22.6 (H) <1.0 mg/dL    Comment: Performed at [facility], [place] [address], [place], [place] [address]  D-dimer, quantitative (not at [facility])     Status: Abnormal   Collection Time:  12/11/19  8:24 PM  Result Value Ref Range   D-Dimer, Quant 7.41 (H) 0.00 - 0.50 ug/mL-FEU    Comment: (NOTE) At the manufacturer cut-off of 0.50 ug/mL FEU, this assay has been documented to exclude PE with a sensitivity and negative predictive value of 97 to 99%.  At this time, this assay has not been approved by the FDA to exclude DVT/VTE. Results should be correlated with clinical presentation. Performed at [facility], [place] [address], [place], [place] [address]   Procalcitonin     Status: None   Collection Time: 12/11/19  8:24 PM  Result Value Ref Range   Procalcitonin 5.49 ng/mL    Comment:        Interpretation: PCT > 2 ng/mL: Systemic infection (sepsis) is likely, unless other causes are known. (NOTE)       Sepsis PCT Algorithm           Lower Respiratory Tract                                      Infection PCT Algorithm    ----------------------------     ----------------------------         PCT < 0.25 ng/mL  PCT < 0.10 ng/mL         Strongly encourage             Strongly discourage   discontinuation of antibiotics    initiation of antibiotics    ----------------------------     -----------------------------       PCT 0.25 - 0.50 ng/mL            PCT 0.10 - 0.25 ng/mL               OR       >80% decrease in PCT            Discourage initiation of                                            antibiotics      Encourage discontinuation           of antibiotics    ----------------------------     -----------------------------         PCT >= 0.50 ng/mL              PCT 0.26 - 0.50 ng/mL               AND       <80% decrease in PCT              Encourage initiation of                                             antibiotics       Encourage continuation           of antibiotics    ----------------------------     -----------------------------        PCT >= 0.50 ng/mL                  PCT > 0.50 ng/mL               AND         increase in PCT                   Strongly encourage                                      initiation of antibiotics    Strongly encourage escalation           of antibiotics                                     -----------------------------                                           PCT <= 0.25 ng/mL                                                 OR                                        >  80% decrease in PCT                                     Discontinue / Do not initiate                                             antibiotics Performed at [facility], [place] [address] [place], [place] [address]   Culture, blood (Routine X 2) w Reflex to ID Panel     Status: None (Preliminary result)   Collection Time: 12/11/19  9:00 PM   Specimen: BLOOD RIGHT HAND  Result Value Ref Range   Specimen Description BLOOD RIGHT HAND    Special Requests      BOTTLES DRAWN AEROBIC ONLY Blood Culture results may not be optimal due to an inadequate volume of blood received in culture bottles   Culture      NO GROWTH < 12 HOURS Performed at [facility] [address], [place], [place] [address]    Report Status PENDING   CBC with Differential/Platelet     Status: Abnormal   Collection Time: 12/12/19  4:49 AM  Result Value Ref Range   WBC 6.0 4.0 - 10.5 K/uL   RBC 2.82 (L) 4.22 - 5.81 MIL/uL   Hemoglobin 10.0 (L) 13.0 - 17.0 g/dL   HCT 30.9 (L) 39.0 - 52.0 %   MCV 109.6 (H) 80.0 - 100.0 fL   MCH 35.5 (H) 26.0 - 34.0 pg   MCHC 32.4 30.0 - 36.0 g/dL   RDW 14.2 11.5 - 15.5 %   Platelets 192 150 - 400 K/uL   nRBC 0.0 0.0 - 0.2 %   Neutrophils Relative % 69 %   Neutro Abs 4.2 1.7 - 7.7 K/uL   Lymphocytes Relative 18 %   Lymphs Abs 1.1 0.7 - 4.0 K/uL   Monocytes Relative 12 %   Monocytes Absolute 0.7 0.1 - 1.0 K/uL   Eosinophils Relative 0 %   Eosinophils Absolute 0.0 0.0 - 0.5 K/uL   Basophils Relative 0 %   Basophils Absolute 0.0 0.0 - 0.1 K/uL   Immature Granulocytes 1 %   Abs Immature Granulocytes 0.03 0.00 -  0.07 K/uL    Comment: Performed at [facility], [address] [place], [place] [address]  C-reactive protein     Status: Abnormal   Collection Time: 12/12/19  4:49 AM  Result Value Ref Range   CRP 22.9 (H) <1.0 mg/dL    Comment: Performed at [facility] [address] [place], [place] [address]  D-dimer, quantitative (not at [facility])     Status: Abnormal   Collection Time: 12/12/19  4:49 AM  Result Value Ref Range   D-Dimer, Quant 7.31 (H) 0.00 - 0.50 ug/mL-FEU    Comment: (NOTE) At the manufacturer cut-off of 0.50 ug/mL FEU, this assay has been documented to exclude PE with a sensitivity and negative predictive value of 97 to 99%.  At this time, this assay has not been approved by the FDA to exclude DVT/VTE. Results should be correlated with clinical presentation. Performed at [facility], [place] [address], [place],  [address]   Ferritin     Status: Abnormal   Collection Time: 12/12/19  4:49 AM  Result  Value Ref Range   Ferritin 1,810 (H) 24 - 336 ng/mL    Comment: Performed at [facility], [place] [address], [place], [place] [address]  Magnesium     Status: None   Collection Time: 12/12/19  4:49 AM  Result Value Ref Range   Magnesium 2.2 1.7 - 2.4 mg/dL    Comment: Performed at [facility] [address] [place], [place] [address]  Phosphorus     Status: Abnormal   Collection Time: 12/12/19  4:49 AM  Result Value Ref Range   Phosphorus 6.4 (H) 2.5 - 4.6 mg/dL    Comment: SLIGHT HEMOLYSIS Performed at [facility] [address] [place], [place] [address]   Protime-INR     Status: Abnormal   Collection Time: 12/12/19  4:49 AM  Result Value Ref Range   Prothrombin Time 17.2 (H) 11.4 - 15.2 seconds   INR 1.4 (H) 0.8 - 1.2    Comment: (NOTE) INR goal varies based on device and disease states. Performed at [facility], [place] [address] [place],  [address]   CBG monitoring, ED     Status: None   Collection Time:  12/12/19  9:50 AM  Result Value Ref Range   Glucose-Capillary 88 70 - 99 mg/dL   Comment 1 Notify RN    Comment 2 Document in Chart   CBG monitoring, ED     Status: None   Collection Time: 12/12/19  1:21 PM  Result Value Ref Range   Glucose-Capillary 91 70 - 99 mg/dL  Hemoglobin A1c     Status: Abnormal   Collection Time: 12/12/19  6:00 PM  Result Value Ref Range   Hgb A1c MFr Bld 5.7 (H) 4.8 - 5.6 %    Comment: (NOTE) Pre diabetes:          5.7%-6.4% Diabetes:              >6.4% Glycemic control for   <7.0% adults with diabetes    Mean Plasma Glucose 116.89 mg/dL    Comment: Performed at [facility], [place] [address], [place],  [address]  CBG monitoring, ED     Status: Abnormal   Collection Time: 12/12/19  6:23 PM  Result Value Ref Range   Glucose-Capillary 104 (H) 70 - 99 mg/dL   Comment 1 Notify RN    Comment 2 Document in Chart    DG Chest Portable 1 View  Result Date: 12/11/2019 CLINICAL DATA:  Weakness. Additional history provided: Weakness/shortness of breath since yesterday. EXAM: PORTABLE CHEST 1 VIEW COMPARISON:  Chest radiograph 11/30/2019 FINDINGS: Unchanged position of a right IJ approach central venous catheter terminating in the upper right atrium. Overlying cardiac monitoring leads. Unchanged cardiomegaly. Again demonstrated is bilateral interstitial prominence. Ill-defined airspace opacities within the lung bases, increased on the left as compared to prior examination. No sizable pleural effusion or evidence of pneumothorax. No acute bony abnormality. IMPRESSION: Redemonstrated interstitial prominence. Ill-defined airspace opacities within the lung bases, increased on the left as compared to prior examination 11/30/2019. Findings may reflect edema and/or pneumonia. Unchanged cardiomegaly. Electronically Signed   By: Kellie Simmering DO   On: 12/11/2019 11:28    Pending Labs Unresulted Labs (From admission, onward)    Start     Ordered   12/12/19 1556  CBC   Once,   STAT     12/12/19 1555   12/12/19 1556  Renal function panel  Once,   STAT  12/12/19 1555   12/12/19 0500  CBC with Differential/Platelet  Daily,   R    Question:  Specimen collection method  Answer:  IV Team=IV Team collect   12/11/19 2024   12/12/19 0500  C-reactive protein  Daily,   R    Question:  Specimen collection method  Answer:  IV Team=IV Team collect   12/11/19 2024   12/12/19 0500  D-dimer, quantitative (not at [facility])  Daily,   R     12/11/19 2024   12/12/19 0500  Ferritin  Daily,   R    Question:  Specimen collection method  Answer:  IV Team=IV Team collect   12/11/19 2024   12/12/19 0500  Magnesium  Daily,   R    Question:  Specimen collection method  Answer:  IV Team=IV Team collect   12/11/19 2024   12/12/19 0500  Phosphorus  Daily,   R    Question:  Specimen collection method  Answer:  IV Team=IV Team collect   12/11/19 2024   12/12/19 0500  Protime-INR  Daily,   R    Question:  Specimen collection method  Answer:  IV Team=IV Team collect   12/11/19 2047   12/11/19 2024  Culture, blood (Routine X 2) w Reflex to ID Panel  BLOOD CULTURE X 2,   R     12/11/19 2024   12/11/19 1049  CBC with Differential  ONCE - STAT,   STAT     12/11/19 1049   12/11/19 1049  Urinalysis, Routine w reflex microscopic  Once,   STAT     12/11/19 1049   12/11/19 1049  Urine culture  ONCE - STAT,   STAT     12/11/19 1049          Vitals/Pain Today's Vitals   12/12/19 1200 12/12/19 1400 12/12/19 1600 12/12/19 1800  BP: 134/75 133/77 126/71 127/71  Pulse: 90 88 88 91  Resp: (!) 29 (!) 30 (!) 22 (!) 24  Temp:      TempSrc:      SpO2: 95% 93% 96% 96%  Weight:      Height:      PainSc:        Isolation Precautions Airborne and Contact precautions  Medications Medications  allopurinol (ZYLOPRIM) tablet 100 mg (has no administration in time range)  traMADol (ULTRAM) tablet 50 mg (has no administration in time range)  bictegravir-emtricitabine-tenofovir AF ([name])  50-200-25 MG per tablet 1 tablet (1 tablet Oral Given 12/12/19 0952)  amiodarone (PACERONE) tablet 200 mg (200 mg Oral Given 12/12/19 0952)  midodrine (PROAMATINE) tablet 10 mg (has no administration in time range)  sucroferric oxyhydroxide (VELPHORO) chewable tablet 500 mg (500 mg Oral Given 12/12/19 1827)  multivitamin (RENA-VIT) tablet 1 tablet (1 tablet Oral Given 12/12/19 1826)  montelukast (SINGULAIR) tablet 10 mg (10 mg Oral Given 12/11/19 2206)  brimonidine (ALPHAGAN) 0.15 % ophthalmic solution 1 drop (1 drop Left Eye Given 12/12/19 1004)  dorzolamide-timolol (COSOPT) 22.3-6.8 MG/ML ophthalmic solution 1 drop (1 drop Left Eye Given 12/12/19 1004)  latanoprost (XALATAN) 0.005 % ophthalmic solution 1 drop (1 drop Left Eye Given 12/11/19 2213)  heparin injection 5,000 Units (5,000 Units Subcutaneous Given 12/12/19 1827)  chlorpheniramine-HYDROcodone (TUSSIONEX) 10-8 MG/5ML suspension 5 mL (has no administration in time range)  ascorbic acid (VITAMIN C) tablet 500 mg (500 mg Oral Given 12/12/19 0952)  zinc sulfate capsule 220 mg (220 mg Oral Given 12/12/19 0952)  acetaminophen (TYLENOL) tablet 650 mg (has no administration  in time range)  senna-docusate (Senokot-S) tablet 1 tablet (has no administration in time range)  ondansetron (ZOFRAN) tablet 4 mg (has no administration in time range)    Or  ondansetron (ZOFRAN) injection 4 mg (has no administration in time range)  dexamethasone (DECADRON) tablet 6 mg (6 mg Oral Given 12/12/19 0952)  insulin aspart (novoLOG) injection 0-9 Units (0 Units Subcutaneous Not Given 12/12/19 1825)  insulin detemir (LEVEMIR) injection 6 Units (6 Units Subcutaneous Not Given 12/12/19 1253)  remdesivir 200 mg in sodium chloride 0.9% 250 mL IVPB (0 mg Intravenous Stopped 12/12/19 1253)    Followed by  remdesivir 100 mg in sodium chloride 0.9 % 100 mL IVPB (has no administration in time range)  Warfarin - Pharmacist Dosing Inpatient (has no administration in time range)  calcitRIOL  (ROCALTROL) capsule 0.5 mcg (has no administration in time range)  0.9 %  sodium chloride infusion (has no administration in time range)  0.9 %  sodium chloride infusion (has no administration in time range)  alteplase (CATHFLO ACTIVASE) injection 2 mg (has no administration in time range)  heparin injection 1,000 Units (has no administration in time range)  lidocaine (PF) (XYLOCAINE) 1 % injection 5 mL (has no administration in time range)  lidocaine-prilocaine (EMLA) cream 1 application (has no administration in time range)  pentafluoroprop-tetrafluoroeth (GEBAUERS) aerosol 1 application (has no administration in time range)  ondansetron (ZOFRAN) injection 4 mg (4 mg Intravenous Given 12/11/19 1505)  acetaminophen (TYLENOL) tablet 650 mg (650 mg Oral Given 12/11/19 1505)  warfarin (COUMADIN) tablet 5 mg (5 mg Oral Given 12/12/19 1826)    Mobility non-ambulatory Moderate fall risk   Focused Assessments Pulmonary Assessment Handoff:  Lung sounds:   O2 Device: Room Air        R Recommendations: See Admitting Provider Note  Report given to:   Additional Notes:

## 2019-12-12 NOTE — ED Notes (Signed)
SDU  Breakfast ordered  

## 2019-12-12 NOTE — Progress Notes (Signed)
Renal Navigator notified [facility] (patient's home [facility]) and [facility] (COVID shift) of patient's COVID positive test result. He will be switched to [facility] for treatment at discharge.  Alphonzo Cruise, [place] Renal Navigator [phone]

## 2019-12-12 NOTE — Progress Notes (Signed)
ANTICOAGULATION CONSULT NOTE - Initial Consult  Pharmacy Consult for warfarin Indication: atrial fibrillation  Allergies  Allergen Reactions  . Dextromethorphan-Guaifenesin     UNKNOWN   . Tocotrienols     UNKNOWN   . Losartan Potassium Other (See Comments)    Causes constipation    Patient Measurements: Height: 6\' 2"  (188 cm) Weight: 180 lb (81.6 kg) IBW/kg (Calculated) : 82.2  Vital Signs: Temp: 99.6 F (37.6 C) (01/08 0710) Temp Source: Oral (01/08 0710) BP: 134/81 (01/08 1000) Pulse Rate: 95 (01/08 0930)  Labs: Recent Labs    12/10/19 1623 12/11/19 1331 12/11/19 1430 12/11/19 1446 12/11/19 1501 12/11/19 1551 12/11/19 2024 12/12/19 0449  HGB  --   --   --   --   --  9.6* 9.6* 10.0*  HCT  --   --   --   --   --  29.5* 29.3* 30.9*  PLT  --   --   --   --   --  144* 161 192  LABPROT  --   --  15.9*  --   --   --   --  17.2*  INR 1.2*  --  1.3*  --   --   --   --  1.4*  CREATININE  --   --   --  6.08*  --   --  7.06*  --   TROPONINIHS  --  255*  --   --  271*  --   --   --     Estimated Creatinine Clearance: 10.1 mL/min (A) (by C-G formula based on SCr of 7.06 mg/dL (H)).   Medical History: Past Medical History:  Diagnosis Date  . 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 Acute on chronic systolic and diastolic heart failure, NYHA class 4 ([place])   . Anemia, iron deficiency 11/15/2011  . Arthritis    "hands, right knee, feet" (02/21/2018)  . Atrial fibrillation ([place])   . Cancer [facility])    hx of prostate; s/p radioactive seed implant 10/2009 Dr Janice Norrie  . Cardiac arrest ([place]) 02/17/2018  . CHF (congestive heart failure) ([place])   . 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 Chronic lower back pain   . CKD (chronic kidney disease) stage V requiring chronic dialysis ([place])   . ESRD on dialysis [facility])    started 02/2018 [place]  . GERD  (gastroesophageal reflux disease)   . Glaucoma    "I had surgery and dont have it any more"  . Gout    daily RX (02/21/2018)  . Heart murmur    "mild" per pt  . Hepatitis    years ago  . HIV (human immunodeficiency virus infection) ([place])   . HIV infection ([place])   . Hyperlipidemia   . Hypertension    followed by [facility] and Vascular (Dr Dani Gobble Cro[place]u)  . Hypertension   . Nonischemic cardiomyopathy ([place])   . Pneumonia 11/2017  . Prostate cancer ([place])   . Sinus bradycardia   . Sleep apnea    does not use a cpap  . Stroke [facility])    "mini stroke" years ago  . Wears glasses    Assessment: 88 yom presented to the ED with generalized weakness. Found to be COVID positive. He is on chronic warfarin for history of afib. INR is subtherapeutic today at 1.4. Hgb is slightly low and platelets are WNL.  D-dimer is elevated. Pt is also on heparin SQ prophylaxis. Warfarin initially managed by MD now pharmacy will be managing.   Of note, pt had low INR on 1/6 and coumadin clinic instructed him to take 10mg  that day them resume previous dose.   PTA warfarin dose 2.5mg  on Mon + Fri and 5mg  all other days  Goal of Therapy:  INR 2-3 Monitor platelets by anticoagulation protocol: Yes   Plan:  Warfarin 5mg  PO x 1 tonight Daily INR DC heparin SQ when INR is therapeutic  [place]wan [place], Rande Lawman 12/12/2019,12:51 PM

## 2019-12-12 NOTE — Evaluation (Signed)
Physical Therapy Evaluation Patient Details Name: [name] Hayduk MRN: [id number] DOB: [birth year] Today's Date: 12/12/2019   History of Present Illness  Patient is a 79 year old male with history of combined systolic and diastolic heart failure (EF 20 to 25% 03/04/2019), iron deficiency anemia, atrial fibrillation on Coumadin only tolerates amiodarone, PEA arrest 02/2018, ESRD on HD MTTS, GERD, HIV, prostate cancer, CVA, hypertension who presented to the ED on 1/7 with complaints of generalized weakness and tremors and not feeling well x5 days.  Found to be Covid +.  Clinical Impression  Patient presents with decreased mobility currently needing max A for EOB sitting and standing.  He presents with decreased strength, balance, decreased activity tolerance, and decreased cognition.  He will benefit from skilled PT in the acute setting to allow return home following SNF level rehab stay.  Patient eager for home and states can get outside help, but getting him to/from dialysis right now would be very difficult. Will follow.     Follow Up Recommendations SNF;Supervision/Assistance - 24 hour    Equipment Recommendations  Other (comment)(TBA)    Recommendations for Other Services       Precautions / Restrictions Precautions Precautions: Fall      Mobility  Bed Mobility Overal bed mobility: Needs Assistance Bed Mobility: Rolling;Sidelying to Sit;Sit to Supine Rolling: Max assist Sidelying to sit: Max assist   Sit to supine: Max assist   General bed mobility comments: cues for technique, assist for reaching rail, for legs off bed, lifting trunk; to supine assist for legs, repositioning trunk once supine  Transfers Overall transfer level: Needs assistance Equipment used: 1 person hand held assist Transfers: Sit to/from Stand Sit to Stand: From elevated surface;Max assist         General transfer comment: heavy lifting help to stand with R hand on rail and L  HHA.  Ambulation/Gait Ambulation/Gait assistance: Max assist Gait Distance (Feet): 1 Feet Assistive device: 1 person hand held assist(and bed rail)       General Gait Details: side step with assist to move his feet toward [place]            Wheelchair Mobility    Modified Rankin (Stroke Patients Only)       Balance Overall balance assessment: Needs assistance Sitting-balance support: Feet supported Sitting balance-Leahy Scale: Poor Sitting balance - Comments: leaning back initially mod A for balance, then progressed to minguard while EOB about 15 minutes for eating lunch   Standing balance support: Bilateral upper extremity supported Standing balance-Leahy Scale: Poor Standing balance comment: UE support for balance                             Pertinent Vitals/Pain Pain Assessment: Faces Faces Pain Scale: Hurts even more Pain Location: perineal area/testicles Pain Descriptors / Indicators: Grimacing;Discomfort Pain Intervention(s): Monitored during session;Repositioned    Home Living Family/patient expects to be discharged to:: Private residence Living Arrangements: Spouse/significant other Available Help at Discharge: Family Type of Home: House Home Access: Stairs to enter Entrance Stairs-Rails: Right Entrance Stairs-Number of Steps: 3 Home Layout: One level Home Equipment: Environmental consultant - 2 wheels;Cane - single point      Prior Function Level of Independence: Independent with assistive device(s)         Comments: reports normally can get around with his walker, but lately wife has had to help him get up     Hand Dominance  Extremity/Trunk Assessment   Upper Extremity Assessment Upper Extremity Assessment: LUE deficits/detail;RUE deficits/detail RUE Deficits / Details: AAROM WFL, strength grossly 3-/5; difficulty getting his fork all the way to his mouth when eating lunch, needing some help LUE Deficits / Details: AAROM WFL,  strength grossly 3-/5    Lower Extremity Assessment Lower Extremity Assessment: LLE deficits/detail;RLE deficits/detail RLE Deficits / Details: AROM WFL, strength hip flexion 2/5, knee extension 4-/5, ankle DF 4/5 LLE Deficits / Details: AROM WFL, strength hip flexion 2/5, knee extension 4-/5, ankle DF 4/5       Communication   Communication: No difficulties  Cognition Arousal/Alertness: Awake/alert Behavior During Therapy: Flat affect Overall Cognitive Status: No family/caregiver present to determine baseline cognitive functioning                                 General Comments: Patient responding to one step commands with increased time and assist, slow to answer questions and limited by malaise.  Orientation not formally assessed, but aware he had decreased abilities just prior to admission      General Comments General comments (skin integrity, edema, etc.): RR 33 with mobility, other VSS    Exercises     Assessment/Plan    PT Assessment Patient needs continued PT services  PT Problem List Decreased strength;Decreased activity tolerance;Decreased mobility;Decreased safety awareness;Decreased knowledge of use of DME;Decreased balance       PT Treatment Interventions DME instruction;Gait training;Functional mobility training;Therapeutic exercise;Balance training;Therapeutic activities;Patient/family education;Stair training    PT Goals (Current goals can be found in the Care Plan section)  Acute Rehab PT Goals Patient Stated Goal: to get better PT Goal Formulation: With patient Time For Goal Achievement: 12/26/19 Potential to Achieve Goals: Good    Frequency Min 3X/week   Barriers to discharge        Co-evaluation               AM-PAC PT "6 Clicks" Mobility  Outcome Measure Help needed turning from your back to your side while in a flat bed without using bedrails?: A Lot Help needed moving from lying on your back to sitting on the side of a  flat bed without using bedrails?: Total Help needed moving to and from a bed to a chair (including a wheelchair)?: Total Help needed standing up from a chair using your arms (e.g., wheelchair or bedside chair)?: Total Help needed to walk in hospital room?: Total Help needed climbing 3-5 steps with a railing? : Total 6 Click Score: 7    End of Session   Activity Tolerance: Patient limited by fatigue;Patient limited by lethargy Patient left: in bed;with call bell/phone within reach Nurse Communication: Mobility status PT Visit Diagnosis: Other abnormalities of gait and mobility (R26.89);Muscle weakness (generalized) (M62.81);Other symptoms and signs involving the nervous system (R29.898)    Time: 5686-1683 PT Time Calculation (min) (ACUTE ONLY): 39 min   Charges:   PT Evaluation $PT Eval Moderate Complexity: 1 Mod PT Treatments $Therapeutic Activity: 8-22 mins $Self Care/Home Management: [place], [place] [phone] 12/12/2019   Reginia Naas 12/12/2019, 4:04 PM

## 2019-12-12 NOTE — Progress Notes (Signed)
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  Initially not started on any Covid treatment as he was hemodynamically stable on room air however had increasing respiratory rate and CRP 22.9 on 1/8 and so was started on dexamethasone and remdesivir.  Actemra on hold due to HIV status.  Cardiology consulted for elevated troponin but not deemed a candidate for cardiac intervention, likely demand ischemia and no therapies recommended.  Nephrology was consulted due to ESRD and palliative care team consulted.  A & P   Active Problems:   COVID-19   1. SIRS/failure to thrive, secondary to COVID-19 a. On admission: Febrile, lactic acidosis.  Both resolved, on room air.  Increased respiratory rate to >30 today with CRP 23 concerning for moderate to severe COVID developing  b. D-dimer 7.3 -pharmacy consulted for Coumadin management.  Patient asymptomatic c. Start dexamethasone 6 mg x 10 days and remdesivir x5 days per pharmacy d. Palliative care consulted e. Tylenol for fever f. Holding Actemra due to HIV status 2. Elevated troponin with extensive cardiac history (history of PEA arrest, combined systolic/diastolic end stage heart failure), possibly demand ischemia a. Troponin 255->271 b. Asymptomatic with baseline LBBB on EKG c. Cardiology consulted-no indication for cardiac intervention d. Palliative care consult e. Telemetry f. Poor prognosis, see Almyra Deforest, PA  note from 12/10/19 for complete details 3. 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 Had left thigh AV graft placed by Dr. Eden Lathe on 11/10/2019, unfortunately this thrombosed and underwent thrombectomy by Dr. Carlis Abbott on 12/14.  Eventually underwent a right SFA to common femoral vein AV loop graft by Dr. Donzetta Matters 12/02/19 b. Nephrology consulted: HD 1/9 per usual schedule 4. Acute on chronic anemia a. Hemoglobin 10, baseline 11-12, hemoglobin 11.6 12/02/2019, at goal b. on ESA 5. Combined systolic/diastolic end-stage heart failure a. Intolerant of beta-blockers in the past and other treatments b. No further options per cardiology c. Treat volume with HD d. I/O and daily weight e. Palliative care consulted 6. Chronic hypotension a. Continue midodrine 7. PAF on Coumadin and amiodarone 1. Coumadin per pharmacy 8. Chronic pain on tramadol 9. History of HIV on [name] 10. Prediabetes a. HbA1c 6.1 in 2019 b. Check HA1C 11. Tremulous with reported occasional alcohol use, likely COVID 19 a. Ethanol level negative 12. Ambulatory dysfunction a. Uses cane and walker at baseline b. PT/OT recommending SNF at discharge   DVT prophylaxis: coumadin with subcutaneous heparin bridge Diet: renal/carb modified Family Communication: Patient's wife has been updated by phone Disposition Plan: Progressive unit, PT/OT recommending SNF at discharge, currently not medically stable for discharge  Consultants  Cardiology Nephrology Palliative care  Procedures  None  Antibiotics   Anti-infectives (From admission, onward)   Start     Dose/Rate Route Frequency Ordered Stop   12/13/19 1000  remdesivir 100 mg in sodium chloride 0.9 % 100 mL IVPB     100 mg 200 mL/hr over 30 Minutes Intravenous Every 24 hours 12/12/19 0724 12/17/19 0959   12/12/19 1000  bictegravir-emtricitabine-tenofovir AF (  [name]) 50-200-25 MG per tablet 1 tablet     1 tablet Oral Daily 12/11/19 2024     12/12/19 0800  remdesivir 200 mg in sodium chloride 0.9%  250 mL IVPB     200 mg 580 mL/hr over 30 Minutes Intravenous Once 12/12/19 0724 12/12/19 1253           Subjective   Evaluated at bedside no acute distress resting comfortably.  Denies lower extremity pain or swelling, states he has improved tremulousness and shortness of breath.  Denies any wheezing, nausea, vomiting.  Denies much appetite but states he drank his milk today.  Did not eat his breakfast no other complaints.  Objective   Vitals:   12/12/19 0930 12/12/19 1000 12/12/19 1200 12/12/19 1400  BP: 138/77 134/81 134/75 133/77  Pulse: 95  90 88  Resp: (!) 25 (!) 25 (!) 29 (!) 30  Temp:      TempSrc:      SpO2: 97%  95% 93%  Weight:      Height:        Intake/Output Summary (Last 24 hours) at 12/12/2019 1810 Last data filed at 12/12/2019 1253 Gross per 24 hour  Intake 250 ml  Output --  Net 250 ml   Filed Weights   12/11/19 1048  Weight: 81.6 kg    Examination:  Physical Exam Vitals and nursing note reviewed.  Constitutional:      General: He is not in acute distress.    Comments: Chronically ill-appearing Improved tremors  HENT:     Head: Normocephalic.  Eyes:     Extraocular Movements: Extraocular movements intact.  Cardiovascular:     Rate and Rhythm: Normal rate.     Pulses: Normal pulses.  Pulmonary:     Effort: Pulmonary effort is normal. No respiratory distress.  Abdominal:     General: Abdomen is flat. There is no distension.  Musculoskeletal:        General: No swelling.     Cervical back: No rigidity.     Comments: Chronic bilateral lower extremity skin changes  Neurological:     Mental Status: He is alert. Mental status is at baseline.  Psychiatric:        Mood and Affect: Mood normal.     Data Reviewed: I have personally reviewed following labs and imaging studies  CBC: Recent Labs  Lab 12/11/19 1551 12/11/19 2024 12/12/19 0449  WBC 5.5 4.9 6.0  NEUTROABS 4.0  --  4.2  HGB 9.6* 9.6* 10.0*  HCT 29.5* 29.3* 30.9*  MCV 109.7*  110.6* 109.6*  PLT 144* 161 789   Basic Metabolic Panel: Recent Labs  Lab 12/11/19 1446 12/11/19 2024 12/12/19 0449  NA 137  --   --   K 4.3  --   --   CL 94*  --   --   CO2 27  --   --   GLUCOSE 106*  --   --   BUN 22  --   --   CREATININE 6.08* 7.06*  --   CALCIUM 8.2*  --   --   MG 2.0  --  2.2  PHOS 3.9  --  6.4*   GFR: Estimated Creatinine Clearance: 10.1 mL/min (A) (by C-G formula based on SCr of 7.06 mg/dL (H)). Liver Function Tests: Recent Labs  Lab 12/11/19 1446  AST 68*  ALT 23  ALKPHOS 98  BILITOT 0.8  PROT 7.1  ALBUMIN 3.0*   No results for input(s): LIPASE, AMYLASE in  the last 168 hours. No results for input(s): AMMONIA in the last 168 hours. Coagulation Profile: Recent Labs  Lab 12/10/19 1623 12/11/19 1430 12/12/19 0449  INR 1.2* 1.3* 1.4*   Cardiac Enzymes: No results for input(s): CKTOTAL, CKMB, CKMBINDEX, TROPONINI in the last 168 hours. BNP (last 3 results) No results for input(s): PROBNP in the last 8760 hours. HbA1C: No results for input(s): HGBA1C in the last 72 hours. CBG: Recent Labs  Lab 12/12/19 0950 12/12/19 1321  GLUCAP 88 91   Lipid Profile: No results for input(s): CHOL, HDL, LDLCALC, TRIG, CHOLHDL, LDLDIRECT in the last 72 hours. Thyroid Function Tests: Recent Labs    12/11/19 1049  TSH 0.294*   Anemia Panel: Recent Labs    12/12/19 0449  FERRITIN 1,810*   Sepsis Labs: Recent Labs  Lab 12/11/19 1230 12/11/19 1331 12/11/19 2024  PROCALCITON  --   --  5.49  LATICACIDVEN 2.3* 1.1  --     Recent Results (from the past 240 hour(s))  Culture, blood (Routine X 2) w Reflex to ID Panel     Status: None (Preliminary result)   Collection Time: 12/11/19  9:00 PM   Specimen: BLOOD RIGHT HAND  Result Value Ref Range Status   Specimen Description BLOOD RIGHT HAND  Final   Special Requests   Final    BOTTLES DRAWN AEROBIC ONLY Blood Culture results may not be optimal due to an inadequate volume of blood received in  culture bottles   Culture   Final    NO GROWTH < 12 HOURS Performed at [facility] [address], [place], [place] [address]    Report Status PENDING  Incomplete         Radiology Studies: DG Chest Portable 1 View  Result Date: 12/11/2019 CLINICAL DATA:  Weakness. Additional history provided: Weakness/shortness of breath since yesterday. EXAM: PORTABLE CHEST 1 VIEW COMPARISON:  Chest radiograph 11/30/2019 FINDINGS: Unchanged position of a right IJ approach central venous catheter terminating in the upper right atrium. Overlying cardiac monitoring leads. Unchanged cardiomegaly. Again demonstrated is bilateral interstitial prominence. Ill-defined airspace opacities within the lung bases, increased on the left as compared to prior examination. No sizable pleural effusion or evidence of pneumothorax. No acute bony abnormality. IMPRESSION: Redemonstrated interstitial prominence. Ill-defined airspace opacities within the lung bases, increased on the left as compared to prior examination 11/30/2019. Findings may reflect edema and/or pneumonia. Unchanged cardiomegaly. Electronically Signed   By: Kellie Simmering DO   On: 12/11/2019 11:28        Scheduled Meds: . allopurinol  100 mg Oral Once per day on Mon Wed Fri  . amiodarone  200 mg Oral Daily  . vitamin C  500 mg Oral Daily  . bictegravir-emtricitabine-tenofovir AF  1 tablet Oral Daily  . brimonidine  1 drop Left Eye BID  . [START ON 12/13/2019] calcitRIOL  0.5 mcg Oral Q T,Th,Sa-HD  . dexamethasone  6 mg Oral Daily  . dorzolamide-timolol  1 drop Left Eye BID  . heparin  5,000 Units Subcutaneous Q8H  . insulin aspart  0-9 Units Subcutaneous Q4H  . insulin detemir  0.075 Units/kg Subcutaneous BID  . latanoprost  1 drop Left Eye QHS  . [START ON 12/13/2019] midodrine  10 mg Oral 3 times per day on Tue Thu Sat  . montelukast  10 mg Oral QHS  . multivitamin  1 tablet Oral Q1400  . sucroferric oxyhydroxide  500 mg Oral TID WC  .  warfarin  5  mg Oral ONCE-1800  . Warfarin - Pharmacist Dosing Inpatient   Does not apply q1800  . zinc sulfate  220 mg Oral Daily   Continuous Infusions: . sodium chloride    . sodium chloride    . [START ON 12/13/2019] remdesivir 100 mg in NS 100 mL       LOS: 1 day    Time spent: 26 minutes with over 50% of the time coordinating the patient's care    [name], DO [facility] Pager [phone]  If 7PM-7AM, please contact night-coverage [email or web address] Password [facility] 12/12/2019, 6:10 PM

## 2019-12-12 NOTE — Consult Note (Signed)
[facility] Renal Consultation Note  Requesting MD: Marva Panda Indication for Consultation: ESRD  Chief complaint: weakness  HPI:   [name] Reindl is a 79 y.o. male with a history of ESRD on hemodialysis Monday Tuesday Thursday Saturday at [facility] who presented to the hospital with weakness, chills, and feeling unwell several days.  He last dialyzed on 1/7 and ran 3 hours and 9 minutes.  He left under his dry weight at 82.1.  Dry weight is 83.5 kg.  He was found to be Covid positive.  He was seen by cardiology on 1/6 and felt to have severe end-stage heart failure with minimal options for treatment due to hypotension despite his midodrine.  Goals of care were addressed at the visit and he still did wish to remain full code.  He denies any shortness of breath right now.  Denies n/v or chest pain.   PMHx:   Past Medical History:  Diagnosis Date  . 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 Acute on chronic systolic and diastolic heart failure, NYHA class 4 ([place])   . Anemia, iron deficiency 11/15/2011  . Arthritis    "hands, right knee, feet" (02/21/2018)  . Atrial fibrillation ([place])   . Cancer [facility])    hx of prostate; s/p radioactive seed implant 10/2009 Dr Janice Norrie  . Cardiac arrest ([place]) 02/17/2018  . CHF (congestive heart failure) ([place])   . Chronic combined systolic and diastolic heart failure, NYHA class 3 ([place]) 06/2010   felt to be secondary to hypertensive cardiomyopathy  . Chronic lower back pain   . CKD (chronic kidney disease) stage V requiring chronic dialysis ([place])   . ESRD on dialysis [facility])    started 02/2018 [place]  . GERD (gastroesophageal reflux disease)   . Glaucoma    "I had surgery and dont have it any more"  . Gout    daily RX (02/21/2018)  . Heart murmur    "mild" per pt  . Hepatitis    years ago  . HIV (human immunodeficiency virus infection) ([place])   . HIV infection ([place])   . Hyperlipidemia   . Hypertension     followed by [facility] (Dr Dani Gobble Croitoru)  . Hypertension   . Nonischemic cardiomyopathy ([place])   . Pneumonia 11/2017  . Prostate cancer ([place])   . Sinus bradycardia   . Sleep apnea    does not use a cpap  . Stroke [facility])    "mini stroke" years ago  . Wears glasses     Past Surgical History:  Procedure Laterality Date  . AV FISTULA PLACEMENT Right 10/13/2016   Procedure: ARTERIOVENOUS (AV) FISTULA CREATION;  Surgeon: [name], MD;  Location: [place];  Service: Vascular;  Laterality: Right;  . AV FISTULA PLACEMENT Left 02/25/2018   Procedure: INSERTION OF ARTERIOVENOUS (AV) GORE-TEX GRAFT LEFT UPPER ARM;  Surgeon: Angelia Mould, MD;  Location: [place];  Service: Vascular;  Laterality: Left;  . AV FISTULA PLACEMENT Right 08/07/2018   Procedure: Creation of right arm brachiocephalic Fistula;  Surgeon: Waynetta Sandy, MD;  Location: [place];  Service: Vascular;  Laterality: Right;  . AV FISTULA PLACEMENT Left 11/10/2019   Procedure: INSERTION OF ARTERIOVENOUS (AV) GORE-TEX GRAFT THIGH;  Surgeon: Elam Dutch, MD;  Location: [place];  Service: Vascular;  Laterality: Left;  . AV FISTULA PLACEMENT Right 12/02/2019   Procedure: INSERTION OF ARTERIOVENOUS (AV) GORE-TEX GRAFT RIGHT  THIGH;  Surgeon: Waynetta Sandy, MD;  Location: [facility] OR;  Service: Vascular;  Laterality: Right;  . BASCILIC VEIN TRANSPOSITION Left 06/24/2015   Procedure: BASILIC VEIN TRANSPOSITION;  Surgeon: Mal Misty, MD;  Location: [place];  Service: Vascular;  Laterality: Left;  . BIOPSY  03/14/2019   Procedure: BIOPSY;  Surgeon: Irene Shipper, MD;  Location: [place];  Service: Endoscopy;;  . CATARACT EXTRACTION, BILATERAL Bilateral   . COLONOSCOPY WITH PROPOFOL N/A 03/14/2019   Procedure: COLONOSCOPY WITH PROPOFOL;  Surgeon: Irene Shipper, MD;  Location: [facility] ENDOSCOPY;  Service: Endoscopy;  Laterality: N/A;  . ESOPHAGOGASTRODUODENOSCOPY (EGD) WITH PROPOFOL N/A 05/05/2014    Procedure: ESOPHAGOGASTRODUODENOSCOPY (EGD) WITH PROPOFOL;  Surgeon: Missy Sabins, MD;  Location: [facility] ENDOSCOPY;  Service: Endoscopy;  Laterality: N/A;  . EYE SURGERY Bilateral    cataract surgery   . GLAUCOMA SURGERY Bilateral   . INSERTION OF ARTERIOVENOUS (AV) ARTEGRAFT ARM  10/09/2018   Procedure: INSERTION OF ARTERIOVENOUS (AV) 36mm x 41cm ARTEGRAFT LEFT UPPER ARM;  Surgeon: Waynetta Sandy, MD;  Location: [place];  Service: Vascular;;  . INSERTION OF DIALYSIS CATHETER Right 07/14/2019   Procedure: Insertion Of Dialysis Catheter;  Surgeon: Elam Dutch, MD;  Location: [facility] OR;  Service: Vascular;  Laterality: Right;  placed right Internal Jugular  . INSERTION PROSTATE RADIATION SEED    . IR FLUORO GUIDE CV LINE RIGHT  02/18/2018  . IR REMOVAL TUN CV CATH W/O FL  12/06/2018  . IR US GUIDE VASC ACCESS RIGHT  02/18/2018  . LEFT HEART CATH AND CORONARY ANGIOGRAPHY N/A 02/20/2018   Procedure: LEFT HEART CATH AND CORONARY ANGIOGRAPHY;  Surgeon: Jettie Booze, MD;  Location: [place] CV LAB;;; 50% ostial OM2, 25% mLAD.  Marland Kitchen LEFT HEART CATH AND CORONARY ANGIOGRAPHY  2004   mildly depressed LV systolic fx EF 74%,JOINOM coronaries/abdominal aorta/renal arteries.  . LOWER EXTREMITY ANGIOGRAM Left 11/17/2019   Procedure: Lower Extremity Fistulogram;  Surgeon: Marty Heck, MD;  Location: [place];  Service: Vascular;  Laterality: Left;  . NM [place]  02/21/2010   No ischemia or infarction.  EF 27%.  Marland Kitchen RADIOACTIVE SEED IMPLANT  2010   prostate cancer  . REVISION OF ARTERIOVENOUS GORETEX GRAFT Right 07/11/2019   Procedure: REVISION OF ARTERIOVENOUS GORETEX GRAFT RIGHT ARM WITH ARTEGRAFT;  Surgeon: Serafina Mitchell, MD;  Location: [place];  Service: Vascular;  Laterality: Right;  . SHUNTOGRAM Right 07/14/2019   Procedure: Shuntogram;  Surgeon: Elam Dutch, MD;  Location: [place];  Service: Vascular;  Laterality: Right;  . THROMBECTOMY AND REVISION OF ARTERIOVENTOUS (AV)  GORETEX  GRAFT Right 07/14/2019   Procedure: THROMBECTOMY AND REVISION OF ARTERIOVENTOUS (AV) GORETEX  GRAFT;  Surgeon: Elam Dutch, MD;  Location: [place];  Service: Vascular;  Laterality: Right;  . THROMBECTOMY W/ EMBOLECTOMY Left 02/26/2018   Procedure: THROMBECTOMY ARTERIOVENOUS GRAFT;  Surgeon: Angelia Mould, MD;  Location: [place];  Service: Vascular;  Laterality: Left;  . THROMBECTOMY W/ EMBOLECTOMY Left 11/17/2019   Procedure: THROMBECTOMY ARTERIOVENOUS GRAFT LEFT THIGH;  Surgeon: Marty Heck, MD;  Location: [place];  Service: Vascular;  Laterality: Left;  . TRANSTHORACIC ECHOCARDIOGRAM  02/2012   [facility] EF 45-50% with mild global HK.  Mild LVH,LA mod. dilated,mild-mod. 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 AI.  Marland Kitchen TRANSTHORACIC ECHOCARDIOGRAM  9/'18; 3/'19   a) Severely reduced EF.  25 from 30%.  GR 1 DD with diffuse ecchymosis. Mild AS & AI.  Mild LA/RA dilation;  b) (post PEA arest):   Moderately dilated LV with moderate concentric virtually.  EF 15 to 20%.  GR 1 DD.  Paradoxical septal motion. Mild AI.  Mild LA dilation.  Poorly visualized RV.  Marland Kitchen TRANSTHORACIC ECHOCARDIOGRAM  02/2019   EF 20-25%.  Unable to assess diastolic function (A. Fib).  No LV apical thrombus.  Mild AI.  Mildly reduced RV function, however poorly visualized.  Marland Kitchen ULTRASOUND GUIDANCE FOR VASCULAR ACCESS  02/20/2018   Procedure: Ultrasound Guidance For Vascular Access;  Surgeon: Jettie Booze, MD;  Location: [place] CV LAB;  Service: Cardiovascular;;  . UPPER EXTREMITY VENOGRAPHY N/A 07/08/2018   Procedure: UPPER EXTREMITY VENOGRAPHY;  Surgeon: Waynetta Sandy, MD;  Location: [place] CV LAB;  Service: Cardiovascular;  Laterality: N/A;  Bilateral    Family Hx:  Family History  Problem Relation Age of Onset  . Hypertension Mother   . Thyroid disease Mother   . Cholelithiasis Daughter   . Cholelithiasis Son   . Hypertension Maternal Grandmother   .  Diabetes Maternal Grandmother   . Heart attack Neg Hx   . Hyperlipidemia Neg Hx   No family history of end-stage renal disease  Social History:  reports that he quit smoking about 15 years ago. His smoking use included cigarettes. He has a 30.00 pack-year smoking history. He has never used smokeless tobacco. He reports current alcohol use of about 1.0 standard drinks of alcohol per week. He reports that he does not use drugs.  Allergies:  Allergies  Allergen Reactions  . Dextromethorphan-Guaifenesin     UNKNOWN   . Tocotrienols     UNKNOWN   . Losartan Potassium Other (See Comments)    Causes constipation    Medications: Prior to Admission medications   Medication Sig Start Date End Date Taking? Authorizing Provider  acetaminophen (TYLENOL) 500 MG tablet Take 1,000 mg by mouth every 6 (six) hours as needed for moderate pain.   Yes [provider]  allopurinol (ZYLOPRIM) 100 MG tablet 1 tablet by mouth 3x weekly after dialysis. 09/15/19  Yes [name], NP  amiodarone (PACERONE) 200 MG tablet Take 1 tablet (200 mg total) by mouth daily. 05/12/19  Yes Croitoru, Mihai, MD  bictegravir-emtricitabine-tenofovir AF ([name]) 50-200-25 MG TABS tablet Take 1 tablet by mouth daily. 09/29/19  Yes Tommy Medal, Lavell Islam, MD  brimonidine (ALPHAGAN) 0.15 % ophthalmic solution Place 1 drop into the left eye 2 (two) times daily. 11/15/17  Yes [provider]  dorzolamide-timolol (COSOPT) 22.3-6.8 MG/ML ophthalmic solution Place 1 drop into the left eye 2 (two) times daily. 12/17/17  Yes [provider]  latanoprost (XALATAN) 0.005 % ophthalmic solution Place 1 drop into the left eye at bedtime.    Yes [provider]  loratadine (CLARITIN) 10 MG tablet Take 10 mg by mouth daily.   Yes [provider]  midodrine (PROAMATINE) 10 MG tablet Take 1 tablet (10 mg total) by mouth as directed. Twice a day on the day of HD Tuesday, Thursday, Saturday (1 in AM and  1 at [place]). Take 1/2 tablet (5mg ) a day on non HD days Patient taking differently: Take 10 mg by mouth See admin instructions. Take 1 tablet (10 mg) by mouth 3 times daily on Tuesdays, Thursdays, & Saturdays. 04/22/19  Yes Leonie Man, MD  montelukast (SINGULAIR) 10 MG tablet Take 1 tablet (10 mg total) by mouth at bedtime. 09/12/19  Yes [name], NP  multivitamin (RENA-VIT) TABS tablet Take 1 tablet by  mouth daily at 2 PM.    Yes [provider]  traMADol (ULTRAM) 50 MG tablet Take 1 tablet (50 mg total) by mouth every 6 (six) hours as needed. 12/02/19  Yes Collins, Susette Racer, PA-C  VELPHORO 500 MG chewable tablet Chew 500 mg by mouth 3 (three) times daily with meals.  09/29/19  Yes [provider]  warfarin (COUMADIN) 5 MG tablet TAKE 1/2 TO 1 TABLET BY MOUTH DAILY AS DIRECTED BY COUMADIN CLINIC Patient taking differently: Take 2.5-5 mg by mouth See admin instructions. Take 1/2 to 1 tablet by mouth daily as directed by coumadin clinic; 2.5 mg on Mon and Fri, 5 ,g all other days 11/04/19  Yes Leonie Man, MD    I have reviewed the patient's reported home and current medications.  Labs:  BMP Latest Ref Rng & Units 12/11/2019 12/11/2019 12/02/2019  Glucose 70 - 99 mg/dL - 106(H) 100(H)  BUN 8 - 23 mg/dL - 22 27(H)  Creatinine 0.61 - 1.24 mg/dL 7.06(H) 6.08(H) 6.60(H)  BUN/Creat Ratio 6 - 22 (calc) - - -  Sodium 135 - 145 mmol/L - 137 139  Potassium 3.5 - 5.1 mmol/L - 4.3 4.7  Chloride 98 - 111 mmol/L - 94(L) 102  CO2 22 - 32 mmol/L - 27 -  Calcium 8.9 - 10.3 mg/dL - 8.2(L) -     ROS:  Pertinent items noted in HPI and remainder of comprehensive ROS otherwise negative.  Physical Exam: Vitals:   12/12/19 0930 12/12/19 1000  BP: 138/77 134/81  Pulse: 95   Resp: (!) 25 (!) 25  Temp:    SpO2: 97%      General:  Adult male in bed in NAD at rest  HEENT: Normocephalic atraumatic Eyes: Extra ocular movements intact Neck: Supple trachea midline Heart: S1-S2  no rub appreciated Lungs: Diminished breath sounds on auscultation.  Patient is comfortable on room air Abdomen: Soft nontender nondistended Extremities: No pitting edema appreciated no cyanosis or clubbing Skin: No rash on extremities exposed Neuro: Alert and oriented x3 follows commands and provides history Psych normal mood and affect Access-right IJ tunneled catheter.  Right femoral graft with bruit and thrill  Outpatient Dialysis Orders:  [place], 400/A1.5, EDW 83.5kg, 2K/2Ca, UF profile 4, AVG, heparin 2000 bolus - Mircera 80mcg IV q 2 weeks (last 12/06/19) - Calcitriol 0.28mcg PO q HD  Last HD session as outpatient: 12/11/19 - ran 3 hours 9 min, left under EDW at 82.1  Assessment/Plan:  # End-stage renal disease - On hemodialysis Monday Tuesday Thursday Saturday with last HD on 1/7 - Plan for HD on 1/9 per his MTTS schedule   # COVID-19 infection - Therapies per primary team  # Combined chronic systolic and diastolic chronic end-stage heart failure - Status post cardiology evaluation with little therapy to add given his hypotension  # Chronic hypotension Reported as being on midodrine  # Anemia secondary to CKD On ESA-last dosed on 1/2 as above; at goal   # Secondary hyperparathyroidism - Will continue calcitriol inpatient and will be administered at his dialysis unit upon discharge.     Claudia Desanctis 12/12/2019, 1:43 PM

## 2019-12-12 NOTE — Plan of Care (Signed)
Outpatient Dialysis Orders:  MTTS [facility] 4hr, 400/A1.5, EDW 83.5kg, 2K/2Ca, UF profile 4, AVG, heparin 2000 bolus - Mircera 50mcg IV q 2 weeks (last 12/06/19) - Calcitriol 0.85mcg PO q HD  Last HD session as outpatient: 12/10/19 - ran 3 hours 9 min, left under EDW at 82.1

## 2019-12-13 DIAGNOSIS — N186 End stage renal disease: Secondary | ICD-10-CM

## 2019-12-13 DIAGNOSIS — Z515 Encounter for palliative care: Secondary | ICD-10-CM

## 2019-12-13 DIAGNOSIS — I5040 Unspecified combined systolic (congestive) and diastolic (congestive) heart failure: Secondary | ICD-10-CM

## 2019-12-13 DIAGNOSIS — Z992 Dependence on renal dialysis: Secondary | ICD-10-CM

## 2019-12-13 LAB — BASIC METABOLIC PANEL
Anion gap: 24 — ABNORMAL HIGH (ref 5–15)
BUN: 72 mg/dL — ABNORMAL HIGH (ref 8–23)
CO2: 20 mmol/L — ABNORMAL LOW (ref 22–32)
Calcium: 8.5 mg/dL — ABNORMAL LOW (ref 8.9–10.3)
Chloride: 91 mmol/L — ABNORMAL LOW (ref 98–111)
Creatinine, Ser: 10.48 mg/dL — ABNORMAL HIGH (ref 0.61–1.24)
GFR calc Af Amer: 5 mL/min — ABNORMAL LOW (ref >60)
GFR calc non Af Amer: 4 mL/min — ABNORMAL LOW (ref >60)
Glucose, Bld: 111 mg/dL — ABNORMAL HIGH (ref 70–99)
Potassium: 5.7 mmol/L — ABNORMAL HIGH (ref 3.5–5.1)
Sodium: 135 mmol/L (ref 135–145)

## 2019-12-13 LAB — CBC WITH DIFFERENTIAL/PLATELET
Abs Immature Granulocytes: 0.04 10*3/uL (ref 0.00–0.07)
Basophils Absolute: 0 10*3/uL (ref 0.0–0.1)
Basophils Relative: 0 %
Eosinophils Absolute: 0 10*3/uL (ref 0.0–0.5)
Eosinophils Relative: 0 %
HCT: 30.4 % — ABNORMAL LOW (ref 39.0–52.0)
Hemoglobin: 10.1 g/dL — ABNORMAL LOW (ref 13.0–17.0)
Immature Granulocytes: 1 %
Lymphocytes Relative: 20 %
Lymphs Abs: 1.1 10*3/uL (ref 0.7–4.0)
MCH: 35.6 pg — ABNORMAL HIGH (ref 26.0–34.0)
MCHC: 33.2 g/dL (ref 30.0–36.0)
MCV: 107 fL — ABNORMAL HIGH (ref 80.0–100.0)
Monocytes Absolute: 0.7 10*3/uL (ref 0.1–1.0)
Monocytes Relative: 13 %
Neutro Abs: 3.5 10*3/uL (ref 1.7–7.7)
Neutrophils Relative %: 66 %
Platelets: 223 10*3/uL (ref 150–400)
RBC: 2.84 MIL/uL — ABNORMAL LOW (ref 4.22–5.81)
RDW: 13.8 % (ref 11.5–15.5)
WBC: 5.4 10*3/uL (ref 4.0–10.5)
nRBC: 0.4 % — ABNORMAL HIGH (ref 0.0–0.2)

## 2019-12-13 LAB — PHOSPHORUS: Phosphorus: 8.1 mg/dL — ABNORMAL HIGH (ref 2.5–4.6)

## 2019-12-13 LAB — GLUCOSE, CAPILLARY
Glucose-Capillary: 101 mg/dL — ABNORMAL HIGH (ref 70–99)
Glucose-Capillary: 130 mg/dL — ABNORMAL HIGH (ref 70–99)
Glucose-Capillary: 132 mg/dL — ABNORMAL HIGH (ref 70–99)
Glucose-Capillary: 96 mg/dL (ref 70–99)
Glucose-Capillary: 98 mg/dL (ref 70–99)

## 2019-12-13 LAB — FERRITIN: Ferritin: 2058 ng/mL — ABNORMAL HIGH (ref 24–336)

## 2019-12-13 LAB — PROTIME-INR
INR: 1.8 — ABNORMAL HIGH (ref 0.8–1.2)
Prothrombin Time: 20.4 seconds — ABNORMAL HIGH (ref 11.4–15.2)

## 2019-12-13 LAB — C-REACTIVE PROTEIN: CRP: 24.2 mg/dL — ABNORMAL HIGH (ref <1.0)

## 2019-12-13 LAB — MAGNESIUM: Magnesium: 2.3 mg/dL (ref 1.7–2.4)

## 2019-12-13 LAB — D-DIMER, QUANTITATIVE: D-Dimer, Quant: 7 ug/mL-FEU — ABNORMAL HIGH (ref 0.00–0.50)

## 2019-12-13 LAB — MRSA PCR SCREENING: MRSA by PCR: NEGATIVE

## 2019-12-13 MED ORDER — WARFARIN SODIUM 5 MG PO TABS
5.0000 mg | ORAL_TABLET | Freq: Once | ORAL | Status: AC
  Administered 2019-12-13: 5 mg via ORAL
  Filled 2019-12-13: qty 1

## 2019-12-13 MED ORDER — PRO-STAT SUGAR FREE PO LIQD
30.0000 mL | Freq: Two times a day (BID) | ORAL | Status: DC
  Administered 2019-12-13 – 2019-12-25 (×15): 30 mL via ORAL
  Filled 2019-12-13 (×26): qty 30

## 2019-12-13 MED ORDER — NEPRO/CARBSTEADY PO LIQD
237.0000 mL | Freq: Three times a day (TID) | ORAL | Status: DC
  Administered 2019-12-13 – 2019-12-23 (×14): 237 mL via ORAL
  Filled 2019-12-13 (×4): qty 237

## 2019-12-13 MED ORDER — SODIUM ZIRCONIUM CYCLOSILICATE 10 G PO PACK
10.0000 g | PACK | Freq: Once | ORAL | Status: AC
  Administered 2019-12-14: 08:00:00 10 g via ORAL
  Filled 2019-12-13 (×2): qty 1

## 2019-12-13 NOTE — Progress Notes (Signed)
Pts belongings (clothing, 2 wallets, car keys, and blanket) returned to pt's wife. Pts wife brought Pensions consultant in for pt.

## 2019-12-13 NOTE — Progress Notes (Signed)
[facility] Progress Note  Name: David [name] MRN: [id number] DOB: 08/13/1941  Chief Complaint:  weakness  Subjective:  Confirms that he normally goes to dialysis 4 days/week.  Has not had a treatment yet today and he is worried about waiting.  Has been on room air  Review of systems:  Denies overt shortness of breath Denies nausea or vomiting    ----- Background on consult:  David [name] is a 79 y.o. male with a history of ESRD on hemodialysis Monday Tuesday Thursday Saturday at [facility] who presented to the hospital with weakness, chills, and feeling unwell several days.  He last dialyzed on 1/7 and ran 3 hours and 9 minutes.  He left under his dry weight at 82.1.  Dry weight is 83.5 kg.  He was found to be Covid positive.  He was seen by cardiology on 1/6 and felt to have severe end-stage heart failure with minimal options for treatment due to hypotension despite his midodrine.  Goals of care were addressed at the visit and he still did wish to remain full code.  He denies any shortness of breath right now.  Denies n/v or chest pain.     Intake/Output Summary (Last 24 hours) at 12/13/2019 1406 Last data filed at 12/13/2019 1346 Gross per 24 hour  Intake 370 ml  Output 75 ml  Net 295 ml    Vitals:  Vitals:   12/12/19 2200 12/13/19 0452 12/13/19 0800 12/13/19 1200  BP:  119/77 122/72 113/67  Pulse:  83 84   Resp:  16 (!) 23 19  Temp:  98.8 F (37.1 C) 99 F (37.2 C) (!) 97.4 F (36.3 C)  TempSrc:  Axillary Axillary Axillary  SpO2:  100% 97% 96%  Weight: 79 kg     Height: 6\' 2"  (1.88 m)        Physical Exam:  General:  Adult male in bed in NAD at rest  HEENT: Normocephalic atraumatic Neck: Supple trachea midline Heart: S1-S2 no rub appreciated Lungs: Diminished breath sounds on auscultation.  Patient is comfortable on room air Abdomen: Soft nontender nondistended Extremities: No pitting edema appreciated no cyanosis or  clubbing Skin: No rash on extremities exposed Neuro: Alert and oriented x3 follows commands and provides history Psych normal mood and affect Access-right IJ tunneled catheter.  Right femoral graft with bruit and thrill   Medications reviewed   Labs:  BMP Latest Ref Rng & Units 12/13/2019 12/12/2019 12/11/2019  Glucose 70 - 99 mg/dL 111(H) 114(H) -  BUN 8 - 23 mg/dL 72(H) 65(H) -  Creatinine 0.61 - 1.24 mg/dL 10.48(H) 9.98(H) 7.06(H)  BUN/Creat Ratio 6 - 22 (calc) - - -  Sodium 135 - 145 mmol/L 135 133(L) -  Potassium 3.5 - 5.1 mmol/L 5.7(H) 5.5(H) -  Chloride 98 - 111 mmol/L 91(L) 91(L) -  CO2 22 - 32 mmol/L 20(L) 23 -  Calcium 8.9 - 10.3 mg/dL 8.5(L) 8.1(L) -   Outpatient Dialysis Orders:  MTTS [facility] 4hr, 400/A1.5, EDW 83.5kg, 2K/2Ca, UF profile 4, AVG, heparin 2000 bolus - Mircera 67mcg IV q 2 weeks (last 12/06/19) - Calcitriol 0.38mcg PO q HD  Last HD session as outpatient: 12/11/19 - ran 3 hours 9 min, left under EDW at 82.1  Assessment/Plan:  # End-stage renal disease - On hemodialysis Monday Tuesday Thursday Saturday with last HD on 1/7 - HD per his MTTS schedule - next HD on 1/9  - lokelma x 1 as well given his hyperkalemia  and need for truncated inpatient treatments  # COVID-19 infection - Therapies per primary team  # 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   Claudia Desanctis, MD 12/13/2019 2:06 PM

## 2019-12-13 NOTE — Consult Note (Signed)
Consultation Note Date: 12/13/2019   Patient Name: David [name]  DOB: 1941-02-26  MRN: [id number]  Age / Sex: 79 y.o., male  PCP: David Alar, NP Referring Physician: [name], MD  Reason for Consultation: Establishing goals of care and Psychosocial/spiritual support  HPI/Patient Profile: 79 y.o. 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  He was found to be septic and positive for COVID 19.   Clinical Assessment and Goals of Care:  I have reviewed medical records including EPIC notes, labs and imaging, received report from Dr. Neysa [name], spoke on the phone with the patient's wife David [name]  to discuss diagnosis prognosis, [place], EOL wishes, disposition and options.  I introduced Palliative Medicine as specialized medical care for people living with serious illness. It focuses on providing relief from the symptoms and stress of a serious illness.   We discussed a brief life review of the patient. David [name] worked for [employer] for 30+ years.  He has 3 children from his 1st marriage that are local and with whom he is close.  He has been married to David [name] for 28 years.  She is his HCPOA.  Per David [name] enjoyed golfing and watching golf on TV.  He enjoyed fishing.  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  He is a strong will man who believes you should be working if you are able.  He is [place].  As far as functional and nutritional status David [name] states that David [name] has had a surgery on each leg in the past 4.5 weeks and he has not recovered yet.  He has difficulty walking because of this.  Also in the last 4-5 days prior to admission  he was barely eating.  He was nauseated with emesis particularly after hemodialysis.  David [name] commented that hemodialysis exhausts him - "It takes all of the good stuff out of him".    We discussed his current illness and what it means in the larger context of his on-going co-morbidities.  Natural disease trajectory and expectations at EOL were discussed.  I attempted to elicit values and goals of care important to the patient.  From a previous PMT consultation in 03/2018 David [name] told David Kaufman, NP - His independence is very important to him.  He never wants to be a burden on anyone.  If he got to the point where all he was able to do was lay around he would rather "just go on ".   David [name] also expressed that being active is important to David [name].  She confirmed that he never wants to just lay around.  The difference between aggressive medical intervention and comfort care was considered in light of the patient's goals of care. Advanced directives, concepts specific to code status,  artifical feeding and hydration, and rehospitalization were considered and discussed.  David [name] expresses with frustration that David [name] goes back and forth with his answers to whether or not he would want resuscitation.  He has told David [name] "if I stop breathing just let me die ", but then changes his mind.  David [name] has indicated what he wants after death.  His funeral services have been planned.  He wants to be cremated.  He wants a service that is only 30 minutes long with no crying.  David [name] had David [name] explain these wishes to his children directly.  David [name] comments that the cardiologist wanted to speak with David [name] next month and she feels that they are going to tell him there is nothing more they can really do for him.  She feels as though he may be nearing end-of-life.  She would like for David [name] to be firm about his goals of care and she asks me to speak to him as I have spoken to her about his health and likelihood of resuscitation should he  have a code.  (Less than 2%).  I reassured David [name] that at this time David [name] is "holding his own ".  We are hopeful he will improve from Covid.  We will keep her updated.  Hospice and Palliative Care services outpatient were explained and offered.  The patient has been followed by [name] and it would be helpful if they would reengage after discharge.  I explained to David [name] that David [name] would not be eligible for hospice services until he was willing to discontinue hemodialysis.  She shares that she does not expect him to do that.  Questions and concerns were addressed.  The family was encouraged to call with questions or concerns.    Primary Decision Maker:  PATIENT .  His healthcare power of attorney is his wife David [name]  SUMMARY OF RECOMMENDATIONS    PMT will follow up with patient tomorrow 1/10 as he is in dialysis this afternoon.  Code Status/Advance Care Planning:  Full code   Symptom Management:   Per primary team  Additional Recommendations (Limitations, Scope, Preferences):  Full Scope Treatment  Palliative Prophylaxis:   Frequent Pain Assessment  Psycho-social/Spiritual:   Desire for further Chaplaincy support: Welcomed   Prognosis: Given COVID-19 infection and severe underlying comorbidities he is at high risk for acute decline in death at any point this hospitalization.  Should he survive this hospitalization he likely has less than 6 months given end-stage heart failure, end-stage renal disease and overall deconditioning and frailness  Discharge Planning: To Be Determined      Primary Diagnoses: Present on Admission: . COVID-19   I have reviewed the medical record, interviewed the patient and family, and examined the patient. The following aspects are pertinent.  Past Medical History:  Diagnosis Date  . 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 Acute on chronic systolic and diastolic heart failure, NYHA class 4 ([place])   . Anemia, iron  deficiency 11/15/2011  . Arthritis    "hands, right knee, feet" (02/21/2018)  . Atrial fibrillation ([place])   . Cancer [facility])    hx of prostate; s/p radioactive seed implant 10/2009 Dr Janice Norrie  . Cardiac arrest ([place]) 02/17/2018  . CHF (congestive heart failure) ([place])   . Chronic combined systolic and diastolic heart failure, NYHA class 3 ([place]) 06/2010   felt to be secondary to hypertensive cardiomyopathy  . Chronic lower back pain   . CKD (chronic kidney disease) stage V requiring chronic dialysis ([place])   .  ESRD on dialysis [facility])    started 02/2018 [place]  . GERD (gastroesophageal reflux disease)   . Glaucoma    "I had surgery and dont have it any more"  . Gout    daily RX (02/21/2018)  . Heart murmur    "mild" per pt  . Hepatitis    years ago  . HIV (human immunodeficiency virus infection) ([place])   . HIV infection ([place])   . Hyperlipidemia   . Hypertension    followed by [name] (Dr Dani Gobble [place]itoru)  . Hypertension   . Nonischemic cardiomyopathy ([place])   . Pneumonia 11/2017  . Prostate cancer ([place])   . Sinus bradycardia   . Sleep apnea    does not use a cpap  . Stroke [place] [name])    "mini stroke" years ago  . Wears glasses    Social History   Socioeconomic History  . Marital status: Married    Spouse name: Not on file  . Number of children: 2  . Years of education: 24  . Highest education level: Not on file  Occupational History  . 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 Smoking status: Former Smoker    Packs/day: 1.00    Years: 30.00    Pack years: 30.00    Types: Cigarettes    Quit date: 2006    Years since quitting: 15.0  . Smokeless tobacco: Never Used  Substance and Sexual Activity  . Alcohol use: Yes    Alcohol/week: 1.0 standard drinks    Types: 1 Shots of liquor per week    Comment: occasional  . Drug use: Never  . Sexual activity: Not Currently    Comment: declined  condoms 09/2019  Other Topics Concern  . Not on file  Social History Narrative   ** Merged History Encounter **       Stays active at home Regular exercise: no Drinks 2 cups of coffee a week, 1 mountain dew soda a day.   Social Determinants of Health   Financial Resource Strain:   . Difficulty of Paying Living Expenses: Not on file  Food Insecurity: No Food Insecurity  . Worried About Charity fundraiser in the Last Year: Never true  . Ran Out of Food in the Last Year: Never true  Transportation Needs: No Transportation Needs  . Lack of Transportation (Medical): No  . Lack of Transportation (Non-Medical): No  Physical Activity:   . Days of Exercise per Week: Not on file  . Minutes of Exercise per Session: Not on file  Stress:   . Feeling of Stress : Not on file  Social Connections:   . Frequency of Communication with Friends and Family: Not on file  . Frequency of Social Gatherings with Friends and Family: Not on file  . Attends Religious Services: Not on file  . Active Member of Clubs or Organizations: Not on file  . Attends Archivist Meetings: Not on file  . Marital Status: Not on file   Family History  Problem Relation Age of Onset  . Hypertension Mother   . Thyroid disease Mother   . Cholelithiasis Daughter   . Cholelithiasis Son   . Hypertension Maternal Grandmother   . Diabetes Maternal Grandmother   . Heart attack Neg Hx   . Hyperlipidemia Neg Hx    Scheduled Meds: . allopurinol  100 mg Oral Once per day on Mon Wed  Fri  . amiodarone  200 mg Oral Daily  . vitamin C  500 mg Oral Daily  . bictegravir-emtricitabine-tenofovir AF  1 tablet Oral Daily  . brimonidine  1 drop Left Eye BID  . calcitRIOL  0.5 mcg Oral Q T,Th,Sa-HD  . Chlorhexidine Gluconate Cloth  6 each Topical Q0600  . dexamethasone  6 mg Oral Daily  . dorzolamide-timolol  1 drop Left Eye BID  . feeding supplement (NEPRO CARB STEADY)  237 mL Oral TID BM  . feeding supplement (PRO-STAT  SUGAR FREE 64)  30 mL Oral BID  . heparin  5,000 Units Subcutaneous Q8H  . insulin aspart  0-9 Units Subcutaneous Q4H  . insulin detemir  0.075 Units/kg Subcutaneous BID  . latanoprost  1 drop Left Eye QHS  . midodrine  10 mg Oral 3 times per day on Tue Thu Sat  . montelukast  10 mg Oral QHS  . multivitamin  1 tablet Oral Q1400  . sodium zirconium cyclosilicate  10 g Oral Once  . sucroferric oxyhydroxide  500 mg Oral TID WC  . warfarin  5 mg Oral ONCE-1800  . Warfarin - Pharmacist Dosing Inpatient   Does not apply q1800  . zinc sulfate  220 mg Oral Daily   Continuous Infusions: . sodium chloride    . sodium chloride    . remdesivir 100 mg in NS 100 mL 100 mg (12/13/19 0904)   PRN Meds:.sodium chloride, sodium chloride, acetaminophen, alteplase, chlorpheniramine-HYDROcodone, heparin, lidocaine (PF), lidocaine-prilocaine, ondansetron **OR** ondansetron (ZOFRAN) IV, pentafluoroprop-tetrafluoroeth, senna-docusate, traMADol Allergies  Allergen Reactions  . Dextromethorphan-Guaifenesin     UNKNOWN   . Tocotrienols     UNKNOWN   . Losartan Potassium Other (See Comments)    Causes constipation    Vital Signs: BP (!) (P) 86/53   Pulse (P) 72   Temp (!) 97.4 F (36.3 C) (Axillary)   Resp (!) 23   Ht 6\' 2"  (1.88 m)   Wt 80 kg   SpO2 94%   BMI 22.64 kg/m  Pain Scale: 0-10   Pain Score: 0-No pain   SpO2: SpO2: 94 % O2 Device:SpO2: 94 % O2 Flow Rate: .   IO: Intake/output summary:   Intake/Output Summary (Last 24 hours) at 12/13/2019 1641 Last data filed at 12/13/2019 1346 Gross per 24 hour  Intake 370 ml  Output 75 ml  Net 295 ml    LBM: Last BM Date: 12/11/19 Baseline Weight: Weight: 81.6 kg Most recent weight: Weight: 80 kg     Palliative Assessment/Data: 40%     Time In: 4:00 PM Time Out: 5:10 PM Time Total: 70 minutes Visit consisted of counseling and education dealing with the complex and emotionally intense issues surrounding the need for palliative care and  symptom management in the setting of serious and potentially life-threatening illness. Greater than 50%  of this time was spent counseling and coordinating care related to the above assessment and plan.  The above conversation was completed via telephone due to the visitor restrictions during the COVID-19 pandemic. Thorough chart review and discussion with necessary members of the care team was completed as part of assessment. All issues were discussed and addressed but no physical exam was performed.  Signed by: Florentina Jenny, PA-C Palliative Medicine Pager: [phone]  Please contact Palliative Medicine Team phone at [phone] for questions and concerns.  For individual provider: See Shea Evans

## 2019-12-13 NOTE — Progress Notes (Signed)
ANTICOAGULATION CONSULT NOTE - Follow up  Pharmacy Consult for warfarin Indication: atrial fibrillation  Allergies  Allergen Reactions  . Dextromethorphan-Guaifenesin     UNKNOWN   . Tocotrienols     UNKNOWN   . Losartan Potassium Other (See Comments)    Causes constipation    Patient Measurements: Height: 6\' 2"  (188 cm) Weight: 174 lb 2.6 oz (79 kg) IBW/kg (Calculated) : 82.2  Vital Signs: Temp: 99 F (37.2 C) (01/09 0800) Temp Source: Axillary (01/09 0452) BP: 122/72 (01/09 0800) Pulse Rate: 84 (01/09 0800)  Labs: Recent Labs    12/11/19 1331 12/11/19 1430 12/11/19 1446 12/11/19 1501 12/11/19 2024 12/12/19 0449 12/12/19 2221 12/13/19 0423  HGB  --   --   --   --  9.6* 10.0* 10.2* 10.1*  HCT  --   --   --   --  29.3* 30.9* 30.0* 30.4*  PLT  --   --   --   --  161 192 185 223  LABPROT  --  15.9*  --   --   --  17.2*  --  20.4*  INR  --  1.3*  --   --   --  1.4*  --  1.8*  CREATININE  --   --  6.08*  --  7.06*  --  9.98*  --   TROPONINIHS 255*  --   --  271*  --   --   --   --     Estimated Creatinine Clearance: 6.9 mL/min (A) (by C-G formula based on SCr of 9.98 mg/dL (H)).   Medical History: Past Medical History:  Diagnosis Date  . 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 Acute on chronic systolic and diastolic heart failure, NYHA class 4 ([place])   . Anemia, iron deficiency 11/15/2011  . Arthritis    "hands, right knee, feet" (02/21/2018)  . Atrial fibrillation ([place])   . Cancer [facility])    hx of prostate; s/p radioactive seed implant 10/2009 Dr Janice Norrie  . Cardiac arrest ([place]) 02/17/2018  . CHF (congestive heart failure) ([place])   . Chronic combined systolic and diastolic heart failure, NYHA class 3 ([place]) 06/2010   felt to be secondary to hypertensive cardiomyopathy  . Chronic lower back pain   . CKD (chronic kidney disease) stage V requiring chronic dialysis ([place])   . ESRD on dialysis [facility])    started 02/2018 [place]  . GERD  (gastroesophageal reflux disease)   . Glaucoma    "I had surgery and dont have it any more"  . Gout    daily RX (02/21/2018)  . Heart murmur    "mild" per pt  . Hepatitis    years ago  . HIV (human immunodeficiency virus infection) ([place])   . HIV infection ([place])   . Hyperlipidemia   . Hypertension    followed by [facility] (Dr Dani Gobble Croitoru)  . Hypertension   . Nonischemic cardiomyopathy ([place])   . Pneumonia 11/2017  . Prostate cancer ([place])   . Sinus bradycardia   . Sleep apnea    does not use a cpap  . Stroke [facility])    "mini stroke" years ago  . Wears glasses    Assessment: 9 yom presented to the ED with generalized weakness. Found to be COVID positive. He is on chronic warfarin for history of afib. INR is subtherapeutic today at 1.8. Hgb is slightly low, but stable and platelets are WNL. D-dimer is elevated. Pt is also  on heparin SQ prophylaxis. No bleeding noted today. Warfarin initially managed by MD now pharmacy will be managing.   Of note, pt had low INR on 1/6 and coumadin clinic instructed him to take 10mg  that day them resume previous dose.   PTA warfarin dose 2.5mg  on Mon + Fri and 5mg  all other days  Goal of Therapy:  INR 2-3 Monitor platelets by anticoagulation protocol: Yes   Plan:  Warfarin 5mg  PO x 1 tonight Daily INR DC heparin SQ when INR is therapeutic  Agnes Lawrence, PharmD PGY1 Pharmacy Resident

## 2019-12-13 NOTE — Progress Notes (Addendum)
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  Initially not started on any Covid treatment as he was hemodynamically stable on room air however had increasing respiratory rate and CRP 22.9 on 1/8 and so was started on dexamethasone and remdesivir.  Actemra on hold due to HIV status.  Cardiology consulted for elevated troponin but not deemed a candidate for cardiac intervention, likely demand ischemia and no therapies recommended.  Nephrology was consulted due to ESRD and palliative care team consulted.  A & P   Active Problems:   COVID-19   1. SIRS/failure to thrive, secondary to COVID-19 a. On admission: Febrile, lactic acidosis.  Both resolved, on room air.  Increased respiratory rate to >30, vitals improved b. D-dimer 7.3 -pharmacy consulted for Coumadin management.  Patient asymptomatic c. CRP 24 d. Continue dexamethasone 6 mg x 10 days and remdesivir x5 days per pharmacy e. Palliative care consulted f. Tylenol for fever g. Holding Actemra due to HIV status 2. Nausea/vomiting 1. Unknown etiology, possibly medication induced.  Resolved 3. Elevated troponin with extensive cardiac history (history of PEA arrest, combined systolic/diastolic end stage heart failure), possibly demand ischemia a. Troponin 255->271 b. Asymptomatic with baseline LBBB on EKG c. Cardiology consulted-no indication for cardiac intervention d. Palliative care  consult e. Telemetry f. Poor prognosis, see Almyra Deforest, PA note from 12/10/19 for complete details 4. 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 Had left thigh AV graft placed by Dr. Eden Lathe on 11/10/2019, unfortunately this thrombosed and underwent thrombectomy by Dr. Carlis Abbott on 12/14.  Eventually underwent a right SFA to common femoral vein AV loop graft by Dr. Donzetta Matters 12/02/19 b. Nephrology consulted: HD 1/9 per usual schedule 5. Hyperkalemia 1. Lokelma x1 per nephro 6. Acute on chronic anemia a. Hemoglobin 10, baseline 11-12, hemoglobin 11.6 12/02/2019, at goal b. on ESA 7. Combined systolic/diastolic end-stage heart failure a. Intolerant of beta-blockers in the past and other treatments b. No further options per cardiology c. Treat volume with HD d. I/O and daily weight e. Palliative care consulted 8. Chronic hypotension a. Continue midodrine 9. PAF on Coumadin and amiodarone 1. Coumadin per pharmacy 10. Chronic pain on tramadol 11. History of HIV on [name] 12. Prediabetes a. HbA1c 5.7 13. Ambulatory dysfunction a. Uses cane and walker at baseline b. PT/OT recommending SNF at discharge   DVT prophylaxis: coumadin with subcutaneous heparin bridge Diet: renal  modified Family Communication: Patient's wife has been updated by phone 1/8 Disposition Plan: Progressive unit, PT/OT recommending SNF at discharge, will likely be medically stable for discharge in 1-2 days  Consultants  Cardiology Nephrology Palliative care  Procedures  None  Antibiotics   Anti-infectives (From admission, onward)   Start     Dose/Rate Route Frequency Ordered Stop   12/13/19 1000  remdesivir 100 mg in sodium chloride 0.9 % 100 mL IVPB     100 mg 200 mL/hr over 30 Minutes Intravenous Every 24 hours 12/12/19 0724 12/17/19 0959   12/12/19 1000  bictegravir-emtricitabine-tenofovir AF ([name]) 50-200-25 MG  per tablet 1 tablet     1 tablet Oral Daily 12/11/19 2024     12/12/19 0800  remdesivir 200 mg in sodium chloride 0.9%  250 mL IVPB     200 mg 580 mL/hr over 30 Minutes Intravenous Once 12/12/19 0724 12/12/19 1253           Subjective   Evaluated at bedside no acute distress resting comfortably.  Denies lower extremity pain or swelling, states he has improved tremulousness and shortness of breath.  Denies any wheezing, nausea, vomiting.  Denies much appetite but states he drank his milk today.  Did not eat his breakfast no other complaints.  Objective   Vitals:   12/13/19 1430 12/13/19 1500 12/13/19 1530 12/13/19 1600  BP: 97/63 (!) 103/57 (!) (P) 93/57 (!) (P) 86/53  Pulse: 77 73 (P) 73 (P) 72  Resp:      Temp:      TempSrc:      SpO2:      Weight:      Height:        Intake/Output Summary (Last 24 hours) at 12/13/2019 1706 Last data filed at 12/13/2019 1346 Gross per 24 hour  Intake 370 ml  Output 75 ml  Net 295 ml   Filed Weights   12/11/19 1048 12/12/19 2200 12/13/19 1405  Weight: 81.6 kg 79 kg 80 kg    Examination:  Physical Exam Vitals and nursing note reviewed.  Constitutional:      General: He is not in acute distress.    Comments: Chronically ill-appearing Improved tremors  HENT:     Head: Normocephalic.  Eyes:     Extraocular Movements: Extraocular movements intact.  Cardiovascular:     Rate and Rhythm: Normal rate.     Pulses: Normal pulses.  Pulmonary:     Effort: Pulmonary effort is normal. No respiratory distress.  Abdominal:     General: Abdomen is flat. There is no distension.  Musculoskeletal:        General: No swelling.     Cervical back: No rigidity.     Comments: Chronic bilateral lower extremity skin changes  Neurological:     Mental Status: He is alert. Mental status is at baseline.  Psychiatric:        Mood and Affect: Mood normal.     Data Reviewed: I have personally reviewed following labs and imaging studies  CBC: Recent Labs  Lab 12/11/19 1551 12/11/19 2024 12/12/19 0449 12/12/19 2221 12/13/19 0423  WBC 5.5 4.9 6.0 4.2 5.4   NEUTROABS 4.0  --  4.2  --  3.5  HGB 9.6* 9.6* 10.0* 10.2* 10.1*  HCT 29.5* 29.3* 30.9* 30.0* 30.4*  MCV 109.7* 110.6* 109.6* 105.6* 107.0*  PLT 144* 161 192 185 732   Basic Metabolic Panel: Recent Labs  Lab 12/11/19 1446 12/11/19 2024 12/12/19 0449 12/12/19 2221 12/13/19 0423  NA 137  --   --  133* 135  K 4.3  --   --  5.5* 5.7*  CL 94*  --   --  91* 91*  CO2 27  --   --  23 20*  GLUCOSE 106*  --   --  114* 111*  BUN 22  --   --  65* 72*  CREATININE 6.08* 7.06*  --  9.98* 10.48*  CALCIUM 8.2*  --   --  8.1* 8.5*  MG 2.0  --  2.2  --  2.3  PHOS 3.9  --  6.4* 7.7* 8.1*  GFR: Estimated Creatinine Clearance: 6.7 mL/min (A) (by C-G formula based on SCr of 10.48 mg/dL (H)). Liver Function Tests: Recent Labs  Lab 12/11/19 1446 12/12/19 2221  AST 68*  --   ALT 23  --   ALKPHOS 98  --   BILITOT 0.8  --   PROT 7.1  --   ALBUMIN 3.0* 2.7*   No results for input(s): LIPASE, AMYLASE in the last 168 hours. No results for input(s): AMMONIA in the last 168 hours. Coagulation Profile: Recent Labs  Lab 12/10/19 1623 12/11/19 1430 12/12/19 0449 12/13/19 0423  INR 1.2* 1.3* 1.4* 1.8*   Cardiac Enzymes: No results for input(s): CKTOTAL, CKMB, CKMBINDEX, TROPONINI in the last 168 hours. BNP (last 3 results) No results for input(s): PROBNP in the last 8760 hours. HbA1C: Recent Labs    12/12/19 1800  HGBA1C 5.7*   CBG: Recent Labs  Lab 12/12/19 1823 12/12/19 2239 12/13/19 0450 12/13/19 0800 12/13/19 1119  GLUCAP 104* 115* 101* 98 132*   Lipid Profile: No results for input(s): CHOL, HDL, LDLCALC, TRIG, CHOLHDL, LDLDIRECT in the last 72 hours. Thyroid Function Tests: Recent Labs    12/11/19 1049  TSH 0.294*   Anemia Panel: Recent Labs    12/12/19 0449 12/13/19 0423  FERRITIN 1,810* 2,058*   Sepsis Labs: Recent Labs  Lab 12/11/19 1230 12/11/19 1331 12/11/19 2024  PROCALCITON  --   --  5.49  LATICACIDVEN 2.3* 1.1  --     Recent Results (from the  past 240 hour(s))  Culture, blood (Routine X 2) w Reflex to ID Panel     Status: None (Preliminary result)   Collection Time: 12/11/19  9:00 PM   Specimen: BLOOD RIGHT HAND  Result Value Ref Range Status   Specimen Description BLOOD RIGHT HAND  Final   Special Requests   Final    BOTTLES DRAWN AEROBIC ONLY Blood Culture results may not be optimal due to an inadequate volume of blood received in culture bottles   Culture   Final    NO GROWTH 2 DAYS Performed at [facility], [place] [address] [place], [place] [address]    Report Status PENDING  Incomplete  MRSA PCR Screening     Status: None   Collection Time: 12/12/19 10:35 PM   Specimen: Nasal Mucosa; Nasopharyngeal  Result Value Ref Range Status   MRSA by PCR NEGATIVE NEGATIVE Final    Comment:        The GeneXpert MRSA Assay (FDA approved for NASAL specimens only), is one component of a comprehensive MRSA colonization surveillance program. It is not intended to diagnose MRSA infection nor to guide or monitor treatment for MRSA infections. Performed at [facility], [place] [address], [place], [place] [address]          Radiology Studies: No results found.      Scheduled Meds: . allopurinol  100 mg Oral Once per day on Mon Wed Fri  . amiodarone  200 mg Oral Daily  . vitamin C  500 mg Oral Daily  . bictegravir-emtricitabine-tenofovir AF  1 tablet Oral Daily  . brimonidine  1 drop Left Eye BID  . calcitRIOL  0.5 mcg Oral Q T,Th,Sa-HD  . Chlorhexidine Gluconate Cloth  6 each Topical Q0600  . dexamethasone  6 mg Oral Daily  . dorzolamide-timolol  1 drop Left Eye BID  . feeding supplement (NEPRO CARB STEADY)  237 mL Oral TID BM  . feeding supplement (PRO-STAT SUGAR FREE 64)  30 mL Oral BID  . heparin  5,000 Units Subcutaneous Q8H  . insulin aspart  0-9 Units Subcutaneous Q4H  . insulin detemir  0.075 Units/kg Subcutaneous BID  . latanoprost  1 drop Left Eye QHS  . midodrine  10 mg Oral 3 times per day on  Tue Thu Sat  . montelukast  10 mg Oral QHS  . multivitamin  1 tablet Oral Q1400  . sodium zirconium cyclosilicate  10 g Oral Once  . sucroferric oxyhydroxide  500 mg Oral TID WC  . warfarin  5 mg Oral ONCE-1800  . Warfarin - Pharmacist Dosing Inpatient   Does not apply q1800  . zinc sulfate  220 mg Oral Daily   Continuous Infusions: . sodium chloride    . sodium chloride    . remdesivir 100 mg in NS 100 mL 100 mg (12/13/19 0904)     LOS: 2 days    Time spent: 20 minutes with over 50% of the time coordinating the patient's care    [name], DO [facility] Pager [phone]  If 7PM-7AM, please contact night-coverage [email or web address] Password [facility] 12/13/2019, 5:06 PM

## 2019-12-13 NOTE — Progress Notes (Signed)
Pt vomited. Denied nausea as cause. Thinks 'it's the medicines'. States he 'feels better now that he's vomited'. Will continue to monitor.

## 2019-12-13 NOTE — Progress Notes (Signed)
Initial Nutrition Assessment  RD working remotely.  DOCUMENTATION CODES:   Not applicable, suspect malnutrition but unable to confirm at this time  INTERVENTION:   - Continue renal MVI daily  - Liberalize diet from Renal/Carb Modified to Renal, verbal with readback placed per MD  - Nepro Shake po TID, each supplement provides 425 kcal and 19 grams protein  - Pro-stat 30 ml po BID, each supplement provides 100 kcal and 15 grams of protein  - Encourage adequate PO intake  NUTRITION DIAGNOSIS:   Inadequate oral intake related to poor appetite, vomiting as evidenced by meal completion < 25%.  GOAL:   Patient will meet greater than or equal to 90% of their needs  MONITOR:   PO intake, Supplement acceptance, Labs, Weight trends, I & O's  REASON FOR ASSESSMENT:   Malnutrition Screening Tool    ASSESSMENT:   79 year old male who presented to the ED on 1/07 with generalized weakness and tremors. PMH of ESRD on HD (MTTS), CHF, anemia, atrial fibrillation, PEA arrest in March 2019, GERD, HIV, prostate cancer, CVA, HTN. Pt tested positive for COVID-19.   Palliative care consult pending.  PT/OT recommending SNF at d/c.  Per RN note, pt with an episode of vomiting this AM. Pt denying nausea.  Unable to reach pt via phone call to room. Per H&P, pt reporting poor appetite.  Weight fairly stable over the last year up until recently. Pt with a 5.4 kg weight loss since 11/26/19. This is a 6.4% weight loss in less than 1 month which is severe and significant for timeframe. Current weight is 79 kg which is well below his EDW of 83.5 kg.  Suspect pt with some degree of malnutrition but unable to confirm without NFPE or diet history.  RD will order oral nutrition supplements to aid pt in meeting kcal and protein needs during admission.  Meal Completion: 0% x 1 meal  Medications reviewed and include: vitamin C, calcitriol, decadron, SSI, Levemir 6 units BID, rena-vit, Lokelma 10  grams once, Velphoro TID, warfarin, zinc sulfate, remdesivir  Labs reviewed: sodium 133, potassium 5.5, chloride 91, phosphorus 8.1 CBG's: 91-132 x 24 hours  NUTRITION - FOCUSED PHYSICAL EXAM:  Unable to complete at this time. RD working remotely.  Diet Order:   Diet Order            Diet renal with fluid restriction Fluid restriction: 1200 mL Fluid; Room service appropriate? Yes; Fluid consistency: Thin  Diet effective now              EDUCATION NEEDS:   No education needs have been identified at this time  Skin:  Skin Assessment: Skin Integrity Issues: Skin Integrity Issues: Incisions: right thigh  Last BM:  12/11/19  Height:   Ht Readings from Last 1 Encounters:  12/12/19 6\' 2"  (1.88 m)    Weight:   Wt Readings from Last 1 Encounters:  12/12/19 79 kg    Ideal Body Weight:  86.4 kg  BMI:  Body mass index is 22.36 kg/m.  Estimated Nutritional Needs:   Kcal:  2100-2300  Protein:  105-120 grams  Fluid:  1000 ml + UOP    Gaynell Face, MS, RD, LDN Inpatient Clinical Dietitian Pager: [phone] Weekend/After Hours: [phone]

## 2019-12-13 NOTE — Evaluation (Signed)
Occupational Therapy Evaluation Patient Details Name: David [name] MRN: [id number] DOB: 04/21/1941 Today's Date: 12/13/2019    History of Present Illness Patient is a 79 year old male with history of combined systolic and diastolic heart failure (EF 20 to 25% 03/04/2019), iron deficiency anemia, atrial fibrillation on Coumadin only tolerates amiodarone, PEA arrest 02/2018, ESRD on HD MTTS, GERD, HIV, prostate cancer, CVA, hypertension who presented to the ED on 1/7 with complaints of generalized weakness and tremors and not feeling well x5 days.  Found to be Covid +.   Clinical Impression   Pt admitted with above. He demonstrates the below listed deficits and will benefit from continued OT to maximize safety and independence with BADLs.  Pt presents to OT with generalized weakness, incoordination bil. UEs due to tremor, impaired balance, cognitive deficits, decreased activity tolerance.  He currently requires mod - total A for ADLs, and mod A +2 for functional transfers.  He fatigues quickly and requires increased time to process info.  02 sats remained >91% on RA.  PTA, pt lived with wife, and reports he was mod I, for the most part with ADLs.  Anticipate he will require SNF level rehab at discharge as I am unsure wife can safely provide level of care he is currently requiring.  Will follow acutely.       Follow Up Recommendations  SNF    Equipment Recommendations  None recommended by OT    Recommendations for Other Services       Precautions / Restrictions        Mobility Bed Mobility Overal bed mobility: Needs Assistance Bed Mobility: Supine to Sit     Supine to sit: Max assist     General bed mobility comments: assist to initiate movement and to move LEs off EOB, as well as assist to lift trunk   Transfers Overall transfer level: Needs assistance Equipment used: Ambulation equipment used Transfers: Sit to/from [employer] Transfers Sit to Stand: Mod  assist;From elevated surface;+2 physical assistance;+2 safety/equipment Stand pivot transfers: Mod assist       General transfer comment: Pt required min A to move sit to stand with the bed height significantly elevated, but required mod A +2 to move sit to stand from recliner using stedy     Balance Overall balance assessment: Needs assistance Sitting-balance support: Feet supported Sitting balance-Leahy Scale: Poor Sitting balance - Comments: Pt initially required min A, but progressed to min guard assist with pt initially losing balance toward the Rt and posteriorly    Standing balance support: Bilateral upper extremity supported Standing balance-Leahy Scale: Poor Standing balance comment: Pt able to maintain static standing in the stedy with close min guard assist and bil. UE support                            ADL either performed or assessed with clinical judgement   ADL Overall ADL's : Needs assistance/impaired Eating/Feeding: Moderate assistance;Sitting   Grooming: Wash/dry hands;Wash/dry face;Minimal assistance;Sitting   Upper Body Bathing: Maximal assistance;Sitting   Lower Body Bathing: Total assistance;Sit to/from stand   Upper Body Dressing : Total assistance;Sitting   Lower Body Dressing: Total assistance;Sit to/from stand   Toilet Transfer: Moderate assistance;Stand-pivot;+2 for physical assistance;+2 for safety/equipment(with use of stedy )   Toileting- Clothing Manipulation and Hygiene: Total assistance;Sit to/from stand       Functional mobility during ADLs: Moderate assistance General ADL Comments: Pt fatigues quickly  Vision         Perception     Praxis      Pertinent Vitals/Pain Pain Assessment: Faces Faces Pain Scale: Hurts a little bit Pain Location: generalized  Pain Descriptors / Indicators: Grimacing Pain Intervention(s): Monitored during session     Hand Dominance Right   Extremity/Trunk Assessment Upper Extremity  Assessment Upper Extremity Assessment: RUE deficits/detail;LUE deficits/detail;Generalized weakness RUE Deficits / Details: strength grossly 3-/5-3/5.  bil. UEs tremulous.   Pt demonstrates difficulty holding phone to his ear to speak with his wife  RUE Coordination: decreased fine motor;decreased gross motor LUE Deficits / Details: strength grossly 3-/5-3/5.  bil. UEs tremulous.   Pt demonstrates difficulty holding phone to his ear to speak with his wife  LUE Coordination: decreased fine motor;decreased gross motor   Lower Extremity Assessment Lower Extremity Assessment: Defer to PT evaluation       Communication Communication Communication: No difficulties   Cognition Arousal/Alertness: Awake/alert Behavior During Therapy: Flat affect Overall Cognitive Status: Impaired/Different from baseline Area of Impairment: Orientation;Attention;Following commands;Safety/judgement;Awareness                 Orientation Level: Disoriented to;Time Current Attention Level: Sustained;Focused   Following Commands: Follows one step commands with increased time Safety/Judgement: Decreased awareness of safety Awareness: Intellectual   General Comments: Pt is very slow to process, and requires cues to execute tasks and requires cues for problem solving    General Comments  02 sats remained >91% on RA throughout session.  Assisted pt with phone call to wife at end of session.  he was able to recall her phone number, but required mod A to complete call due to difficulty with processing, initiation, and with holding the phone     Exercises     Shoulder Instructions      Home Living Family/patient expects to be discharged to:: Private residence Living Arrangements: Spouse/significant other Available Help at Discharge: Family Type of Home: House Home Access: Stairs to enter Technical brewer of Steps: 3 Entrance Stairs-Rails: Right Home Layout: One level     Bathroom Shower/Tub:  Occupational psychologist: [place]: Environmental consultant - 2 wheels;Cane - single point          Prior Functioning/Environment Level of Independence: Independent with assistive device(s)        Comments: Pt reports he was able to perform ADLs and he reports normally can get around with his walker, but lately wife has had to help him get up        OT Problem List: Decreased strength;Decreased activity tolerance;Impaired balance (sitting and/or standing);Decreased coordination;Decreased cognition;Decreased safety awareness;Decreased knowledge of use of DME or AE;Cardiopulmonary status limiting activity      OT Treatment/Interventions: Self-care/ADL training;Neuromuscular education;DME and/or AE instruction;Therapeutic activities;Cognitive remediation/compensation;Patient/family education;Balance training    OT Goals(Current goals can be found in the care plan section) Acute Rehab OT Goals Patient Stated Goal: to feel better  OT Goal Formulation: With patient Time For Goal Achievement: 12/27/19 Potential to Achieve Goals: Good ADL Goals Pt Will Perform Grooming: (P) with min assist;sitting Pt Will Perform Upper Body Bathing: (P) with min assist;sitting Pt Will Perform Lower Body Bathing: (P) with mod assist;sit to/from stand Pt Will Transfer to Toilet: (P) with mod assist;stand pivot transfer;bedside commode Pt Will Perform Toileting - Clothing Manipulation and hygiene: (P) with mod assist;sit to/from stand  OT Frequency: Min 2X/week   Barriers to D/C: Decreased caregiver support  unsure if wife able  to provide current level of assist        Co-evaluation              AM-PAC OT "6 Clicks" Daily Activity     Outcome Measure Help from another person eating meals?: A Lot Help from another person taking care of personal grooming?: A Lot Help from another person toileting, which includes using toliet, bedpan, or urinal?: Total Help from another person  bathing (including washing, rinsing, drying)?: A Lot Help from another person to put on and taking off regular upper body clothing?: Total Help from another person to put on and taking off regular lower body clothing?: Total 6 Click Score: 9   End of Session Equipment Utilized During Treatment: Other (comment)(stedy ) Nurse Communication: Mobility status;Need for lift equipment  Activity Tolerance: Patient limited by fatigue Patient left: with call bell/phone within reach;Other (comment);with nursing/sitter in room(on BSC with RN )  OT Visit Diagnosis: Unsteadiness on feet (R26.81);Cognitive communication deficit (R41.841);Muscle weakness (generalized) (M62.81)                Time: 6701-4103 OT Time Calculation (min): 55 min Charges:  OT General Charges $OT Visit: 1 Visit OT Evaluation $OT Eval Moderate Complexity: 1 Mod OT Treatments $Self Care/Home Management : 23-37 mins  Nilsa Nutting., OTR/L Acute Rehabilitation Services Pager [phone] Office [place], [place] 12/13/2019, 5:38 PM

## 2019-12-14 DIAGNOSIS — R112 Nausea with vomiting, unspecified: Secondary | ICD-10-CM

## 2019-12-14 LAB — GLUCOSE, CAPILLARY
Glucose-Capillary: 91 mg/dL (ref 70–99)
Glucose-Capillary: 95 mg/dL (ref 70–99)
Glucose-Capillary: 107 mg/dL — ABNORMAL HIGH (ref 70–99)
Glucose-Capillary: 147 mg/dL — ABNORMAL HIGH (ref 70–99)
Glucose-Capillary: 121 mg/dL — ABNORMAL HIGH (ref 70–99)

## 2019-12-14 LAB — CBC WITH DIFFERENTIAL/PLATELET
Abs Immature Granulocytes: 0.05 10*3/uL (ref 0.00–0.07)
Basophils Absolute: 0 10*3/uL (ref 0.0–0.1)
Basophils Relative: 0 %
Eosinophils Absolute: 0 10*3/uL (ref 0.0–0.5)
Eosinophils Relative: 0 %
HCT: 29.3 % — ABNORMAL LOW (ref 39.0–52.0)
Hemoglobin: 9.7 g/dL — ABNORMAL LOW (ref 13.0–17.0)
Immature Granulocytes: 1 %
Lymphocytes Relative: 19 %
Lymphs Abs: 1.3 10*3/uL (ref 0.7–4.0)
MCH: 34.9 pg — ABNORMAL HIGH (ref 26.0–34.0)
MCHC: 33.1 g/dL (ref 30.0–36.0)
MCV: 105.4 fL — ABNORMAL HIGH (ref 80.0–100.0)
Monocytes Absolute: 0.7 10*3/uL (ref 0.1–1.0)
Monocytes Relative: 10 %
Neutro Abs: 4.7 10*3/uL (ref 1.7–7.7)
Neutrophils Relative %: 70 %
Platelets: 247 10*3/uL (ref 150–400)
RBC: 2.78 MIL/uL — ABNORMAL LOW (ref 4.22–5.81)
RDW: 13.9 % (ref 11.5–15.5)
WBC: 6.6 10*3/uL (ref 4.0–10.5)
nRBC: 0.9 % — ABNORMAL HIGH (ref 0.0–0.2)

## 2019-12-14 LAB — RENAL FUNCTION PANEL
Albumin: 2.5 g/dL — ABNORMAL LOW (ref 3.5–5.0)
Anion gap: 16 — ABNORMAL HIGH (ref 5–15)
BUN: 59 mg/dL — ABNORMAL HIGH (ref 8–23)
CO2: 24 mmol/L (ref 22–32)
Calcium: 8.3 mg/dL — ABNORMAL LOW (ref 8.9–10.3)
Chloride: 96 mmol/L — ABNORMAL LOW (ref 98–111)
Creatinine, Ser: 8.44 mg/dL — ABNORMAL HIGH (ref 0.61–1.24)
GFR calc Af Amer: 6 mL/min — ABNORMAL LOW (ref >60)
GFR calc non Af Amer: 5 mL/min — ABNORMAL LOW (ref >60)
Glucose, Bld: 97 mg/dL (ref 70–99)
Phosphorus: 7 mg/dL — ABNORMAL HIGH (ref 2.5–4.6)
Potassium: 5 mmol/L (ref 3.5–5.1)
Sodium: 136 mmol/L (ref 135–145)

## 2019-12-14 LAB — D-DIMER, QUANTITATIVE: D-Dimer, Quant: 4.98 ug/mL-FEU — ABNORMAL HIGH (ref 0.00–0.50)

## 2019-12-14 LAB — PROTIME-INR
INR: 2.3 — ABNORMAL HIGH (ref 0.8–1.2)
Prothrombin Time: 25.2 seconds — ABNORMAL HIGH (ref 11.4–15.2)

## 2019-12-14 LAB — PHOSPHORUS: Phosphorus: 6.9 mg/dL — ABNORMAL HIGH (ref 2.5–4.6)

## 2019-12-14 LAB — MAGNESIUM: Magnesium: 2.2 mg/dL (ref 1.7–2.4)

## 2019-12-14 LAB — FERRITIN: Ferritin: 2420 ng/mL — ABNORMAL HIGH (ref 24–336)

## 2019-12-14 LAB — C-REACTIVE PROTEIN: CRP: 16.4 mg/dL — ABNORMAL HIGH (ref <1.0)

## 2019-12-14 MED ORDER — CHLORHEXIDINE GLUCONATE CLOTH 2 % EX PADS
6.0000 | MEDICATED_PAD | Freq: Every day | CUTANEOUS | Status: DC
  Administered 2019-12-15 – 2019-12-16 (×2): 6 via TOPICAL

## 2019-12-14 MED ORDER — MIDODRINE HCL 5 MG PO TABS: 10.0000 mg | ORAL_TABLET | ORAL | Status: DC

## 2019-12-14 MED ORDER — WARFARIN SODIUM 5 MG PO TABS
5.0000 mg | ORAL_TABLET | Freq: Once | ORAL | Status: AC
  Administered 2019-12-14: 18:00:00 5 mg via ORAL
  Filled 2019-12-14: qty 1

## 2019-12-14 MED ORDER — FAMOTIDINE 10 MG PO TABS
10.0000 mg | ORAL_TABLET | Freq: Every day | ORAL | Status: DC
  Administered 2019-12-14 – 2020-01-09 (×26): 10 mg via ORAL
  Filled 2019-12-14 (×26): qty 1

## 2019-12-14 MED ORDER — SODIUM ZIRCONIUM CYCLOSILICATE 10 G PO PACK
10.0000 g | PACK | Freq: Once | ORAL | Status: AC
  Administered 2019-12-14: 20:00:00 10 g via ORAL
  Filled 2019-12-14: qty 1

## 2019-12-14 MED ORDER — MIDODRINE HCL 5 MG PO TABS
10.0000 mg | ORAL_TABLET | ORAL | Status: DC
  Administered 2019-12-15 (×2): 10 mg via ORAL
  Filled 2019-12-14 (×3): qty 2

## 2019-12-14 NOTE — Progress Notes (Signed)
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  Initially not started on any Covid treatment as he was hemodynamically stable on room air however had increasing respiratory rate and CRP 22.9 on 1/8 and so was started on dexamethasone and remdesivir.  Actemra on hold due to HIV status.  Cardiology consulted for elevated troponin but not deemed a candidate for cardiac intervention, likely demand ischemia and no therapies recommended.  Nephrology was consulted due to ESRD and palliative care team consulted.  A & P   Active Problems:   COVID-19   Combined systolic and diastolic heart failure (HCC)   ESRD on dialysis ([place])   1. SIRS/failure to thrive, secondary to COVID-19 a. On admission: Febrile, lactic acidosis.  Both resolved, on room air.  Increased respiratory rate to >30, vitals improved b. D-dimer peaked at 7.4 -pharmacy consulted for Coumadin management.  Patient asymptomatic c. CRP 24-> 16 d. Day 3/10 dexamethasone e. Day 3/5 Remdesivir f. Palliative care consulted - remains Full Code, has poor prognosis g. Tylenol for fever h. Actemra held due to HIV status 2. Nausea/vomiting, possible uremia versus unknown medication induced 1. Vomited today during exam soon after he received Velphoro, this was relayed to nephro 2. Continue Zofran 3. Add pepcid 3. Elevated troponin with extensive cardiac history (history of PEA arrest, combined  systolic/diastolic end stage heart failure), possibly demand ischemia in ESRD patient, stable a. Troponin 255->271 b. Asymptomatic with baseline LBBB on EKG c. Cardiology consulted-no indication for cardiac intervention d. Palliative care consulted e. Telemetry f. Poor prognosis, see Almyra Deforest, PA note from 12/10/19 for complete details 4. 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 Had left thigh AV graft placed by Dr. Eden Lathe on 11/10/2019, unfortunately this thrombosed and underwent thrombectomy by Dr. Carlis Abbott on 12/14.  Eventually underwent a right SFA to common femoral vein AV loop graft by Dr. Donzetta Matters 12/02/19 b. Nephrology consulted: HD 1/9 per usual schedule 5. Hyperkalemia 1. Post Lokelma x1 per nephro 6. Chronic anemia a. At goal   b. on ESA 7. Combined systolic/diastolic end-stage heart failure a. Intolerant of beta-blockers in the past and other treatments b. No further options per cardiology c. Treat volume with HD d. I/O and daily weight e. Palliative care consulted 8. Chronic hypotension a. Continue midodrine 9. PAF on Coumadin and amiodarone 1. Coumadin per pharmacy 10. Chronic pain on tramadol 11. History of HIV on [name] 12. Prediabetes a. HbA1c 5.7 13. Ambulatory dysfunction a. Uses cane and walker at baseline b. PT/OT recommending SNF at discharge 14. Goals of Care a. Does not seem to have capacity b. Formal capacity eval in AM  DVT prophylaxis: coumadin with subcutaneous heparin bridge Diet: renal  modified Family Communication: wife updated on phone at bedside Disposition Plan: med tele  Consultants  Cardiology Nephrology Palliative care  Procedures  None  Antibiotics   Anti-infectives (From admission, onward)   Start     Dose/Rate Route Frequency Ordered Stop   12/13/19 1000  remdesivir 100 mg in sodium  chloride 0.9 % 100 mL IVPB     100 mg 200 mL/hr over 30 Minutes Intravenous Every 24 hours 12/12/19 0724 12/17/19 0959   12/12/19 1000  bictegravir-emtricitabine-tenofovir  AF ([name]) 50-200-25 MG per tablet 1 tablet     1 tablet Oral Daily 12/11/19 2024     12/12/19 0800  remdesivir 200 mg in sodium chloride 0.9% 250 mL IVPB     200 mg 580 mL/hr over 30 Minutes Intravenous Once 12/12/19 0724 12/12/19 1253           Subjective   Patient resting comfortably on room air at bedside.  Patient's wife on the phone on speaker.  She discussed her conversation briefly with palliative care.  Palliative care PA had not spoken with the patient by that point.  Soon after I entered the room, patient began to have sudden onset nausea and began to vomit that looked like red-tinged vomitus.  Nurse called to bedside who stated patient recently received medication and she had been trying to space out his meds today to prevent recurrence of vomiting as yesterday patient vomited post his medications as well.  Patient had resolution of symptoms and denied any other complaints.  Objective   Vitals:   12/13/19 2342 12/14/19 0000 12/14/19 0351 12/14/19 0746  BP:  110/64 106/62 102/64  Pulse:  76 72 75  Resp:  (!) 22 19 17   Temp: 97.6 F (36.4 C)  98.4 F (36.9 C) 99.1 F (37.3 C)  TempSrc: Oral  Oral Axillary  SpO2:  95% 93% 94%  Weight:   79.2 kg   Height:        Intake/Output Summary (Last 24 hours) at 12/14/2019 0827 Last data filed at 12/14/2019 0300 Gross per 24 hour  Intake 470 ml  Output 75 ml  Net 395 ml   Filed Weights   12/13/19 1405 12/13/19 1730 12/14/19 0351  Weight: 80 kg 80 kg 79.2 kg    Examination:  Physical Exam Vitals and nursing note reviewed.  Constitutional:      Comments: Awake  HENT:     Head: Normocephalic.  Cardiovascular:     Rate and Rhythm: Normal rate.  Abdominal:     General: Abdomen is flat. There is no distension.     Comments: Vomiting  Musculoskeletal:        General: No swelling or tenderness.  Neurological:     Comments: Seems at/near baseline slowed mentation but does respond to questions.  Psychiatric:      Comments: Seems to lack insight into his disease as well as understanding of his prognosis     Data Reviewed: I have personally reviewed following labs and imaging studies  CBC: Recent Labs  Lab 12/11/19 1551 12/11/19 2024 12/12/19 0449 12/12/19 2221 12/13/19 0423 12/14/19 0400  WBC 5.5 4.9 6.0 4.2 5.4 6.6  NEUTROABS 4.0  --  4.2  --  3.5 4.7  HGB 9.6* 9.6* 10.0* 10.2* 10.1* 9.7*  HCT 29.5* 29.3* 30.9* 30.0* 30.4* 29.3*  MCV 109.7* 110.6* 109.6* 105.6* 107.0* 105.4*  PLT 144* 161 192 185 223 427   Basic Metabolic Panel: Recent Labs  Lab 12/11/19 1446 12/11/19 2024 12/12/19 0449 12/12/19 2221 12/13/19 0423 12/14/19 0400  NA 137  --   --  133* 135 136  K 4.3  --   --  5.5* 5.7* 5.0  CL 94*  --   --  91* 91* 96*  CO2 27  --   --  23 20* 24  GLUCOSE 106*  --   --  114* 111* 97  BUN 22  --   --  65* 72* 59*  CREATININE 6.08* 7.06*  --  9.98* 10.48* 8.44*  CALCIUM 8.2*  --   --  8.1* 8.5* 8.3*  MG 2.0  --  2.2  --  2.3 2.2  PHOS 3.9  --  6.4* 7.7* 8.1* 7.0*  6.9*   GFR: Estimated Creatinine Clearance: 8.2 mL/min (A) (by C-G formula based on SCr of 8.44 mg/dL (H)). Liver Function Tests: Recent Labs  Lab 12/11/19 1446 12/12/19 2221 12/14/19 0400  AST 68*  --   --   ALT 23  --   --   ALKPHOS 98  --   --   BILITOT 0.8  --   --   PROT 7.1  --   --   ALBUMIN 3.0* 2.7* 2.5*   No results for input(s): LIPASE, AMYLASE in the last 168 hours. No results for input(s): AMMONIA in the last 168 hours. Coagulation Profile: Recent Labs  Lab 12/10/19 1623 12/11/19 1430 12/12/19 0449 12/13/19 0423 12/14/19 0400  INR 1.2* 1.3* 1.4* 1.8* 2.3*   Cardiac Enzymes: No results for input(s): CKTOTAL, CKMB, CKMBINDEX, TROPONINI in the last 168 hours. BNP (last 3 results) No results for input(s): PROBNP in the last 8760 hours. HbA1C: Recent Labs    12/12/19 1800  HGBA1C 5.7*   CBG: Recent Labs  Lab 12/13/19 1119 12/13/19 1953 12/13/19 2345 12/14/19 0356 12/14/19  0749  GLUCAP 132* 130* 96 91 95   Lipid Profile: No results for input(s): CHOL, HDL, LDLCALC, TRIG, CHOLHDL, LDLDIRECT in the last 72 hours. Thyroid Function Tests: Recent Labs    12/11/19 1049  TSH 0.294*   Anemia Panel: Recent Labs    12/13/19 0423 12/14/19 0400  FERRITIN 2,058* 2,420*   Sepsis Labs: Recent Labs  Lab 12/11/19 1230 12/11/19 1331 12/11/19 2024  PROCALCITON  --   --  5.49  LATICACIDVEN 2.3* 1.1  --     Recent Results (from the past 240 hour(s))  Culture, blood (Routine X 2) w Reflex to ID Panel     Status: None (Preliminary result)   Collection Time: 12/11/19  9:00 PM   Specimen: BLOOD RIGHT HAND  Result Value Ref Range Status   Specimen Description BLOOD RIGHT HAND  Final   Special Requests   Final    BOTTLES DRAWN AEROBIC ONLY Blood Culture results may not be optimal due to an inadequate volume of blood received in culture bottles   Culture   Final    NO GROWTH 2 DAYS Performed at [facility], [place] [address] [place], [place] [address]    Report Status PENDING  Incomplete  MRSA PCR Screening     Status: None   Collection Time: 12/12/19 10:35 PM   Specimen: Nasal Mucosa; Nasopharyngeal  Result Value Ref Range Status   MRSA by PCR NEGATIVE NEGATIVE Final    Comment:        The GeneXpert MRSA Assay (FDA approved for NASAL specimens only), is one component of a comprehensive MRSA colonization surveillance program. It is not intended to diagnose MRSA infection nor to guide or monitor treatment for MRSA infections. Performed at [facility], [place] [address], [place], [place] [address]          Radiology Studies: No results found.      Scheduled Meds: . allopurinol  100 mg Oral Once per day on Mon Wed Fri  .  amiodarone  200 mg Oral Daily  . vitamin C  500 mg Oral Daily  . bictegravir-emtricitabine-tenofovir AF  1 tablet Oral Daily  . brimonidine  1 drop Left Eye BID  . calcitRIOL  0.5 mcg Oral Q T,Th,Sa-HD  .  Chlorhexidine Gluconate Cloth  6 each Topical Q0600  . dexamethasone  6 mg Oral Daily  . dorzolamide-timolol  1 drop Left Eye BID  . feeding supplement (NEPRO CARB STEADY)  237 mL Oral TID BM  . feeding supplement (PRO-STAT SUGAR FREE 64)  30 mL Oral BID  . heparin  5,000 Units Subcutaneous Q8H  . insulin aspart  0-9 Units Subcutaneous Q4H  . insulin detemir  0.075 Units/kg Subcutaneous BID  . latanoprost  1 drop Left Eye QHS  . midodrine  10 mg Oral 3 times per day on Tue Thu Sat  . montelukast  10 mg Oral QHS  . multivitamin  1 tablet Oral Q1400  . sucroferric oxyhydroxide  500 mg Oral TID WC  . Warfarin - Pharmacist Dosing Inpatient   Does not apply q1800  . zinc sulfate  220 mg Oral Daily   Continuous Infusions: . sodium chloride    . sodium chloride    . remdesivir 100 mg in NS 100 mL 100 mg (12/13/19 0904)     LOS: 3 days    Time spent: 83minutes with over 50% of the time coordinating the patient's care    [name], DO [facility] Pager [phone]  If 7PM-7AM, please contact night-coverage [email or web address] Password [facility] 12/14/2019, 8:27 AM

## 2019-12-14 NOTE — Progress Notes (Signed)
[facility] Progress Note  Name: David [name] MRN: [id number] DOB: 09/29/1941  Chief Complaint:  weakness  Subjective:  Seen on 1/10 with appropriate PPE.  Feels ok on room air and doesn't feel swollen.  Had HD on 1/9 with no UF and goal of 2 kg as tolerated.    Review of systems:  Denies shortness of breath or chest pain  Reports nausea this AM maybe after taking binders - sometimes happens per pt.  Better now Taking PO    ----- Background on consult:  David [name] is a 79 y.o. male with a history of ESRD on hemodialysis Monday Tuesday Thursday Saturday at [facility] who presented to the hospital with weakness, chills, and feeling unwell several days.  He last dialyzed on 1/7 and ran 3 hours and 9 minutes.  He left under his dry weight at 82.1.  Dry weight is 83.5 kg.  He was found to be Covid positive.  He was seen by cardiology on 1/6 and felt to have severe end-stage heart failure with minimal options for treatment due to hypotension despite his midodrine.  Goals of care were addressed at the visit and he still did wish to remain full code.  He denies any shortness of breath right now.  Denies n/v or chest pain.     Intake/Output Summary (Last 24 hours) at 12/14/2019 1646 Last data filed at 12/14/2019 1010 Gross per 24 hour  Intake 615 ml  Output --  Net 615 ml    Vitals:  Vitals:   12/14/19 0351 12/14/19 0746 12/14/19 1217 12/14/19 1600  BP: 106/62 102/64 102/65 (!) 104/59  Pulse: 72 75 75 77  Resp: 19 17 (!) 25 (!) 21  Temp: 98.4 F (36.9 C) 99.1 F (37.3 C) 97.8 F (36.6 C) 98.9 F (37.2 C)  TempSrc: Oral Axillary Oral Axillary  SpO2: 93% 94% 91% 94%  Weight: 79.2 kg     Height:         Physical Exam:  General:  Adult male in bed in NAD at rest  HEENT: Normocephalic atraumatic Neck: Supple trachea midline Heart: S1-S2 no rub appreciated Lungs: Diminished breath sounds on auscultation.  Patient is comfortable on room  air Abdomen: Soft nontender nondistended Extremities: No pitting edema appreciated no cyanosis or clubbing Skin: No rash on extremities exposed Neuro: Alert and oriented x3 follows commands and provides history Psych normal mood and affect Access-right IJ tunneled catheter. Also with Right femoral graft with bruit and thrill   Medications reviewed   Labs:  BMP Latest Ref Rng & Units 12/14/2019 12/13/2019 12/12/2019  Glucose 70 - 99 mg/dL 97 111(H) 114(H)  BUN 8 - 23 mg/dL 59(H) 72(H) 65(H)  Creatinine 0.61 - 1.24 mg/dL 8.44(H) 10.48(H) 9.98(H)  BUN/Creat Ratio 6 - 22 (calc) - - -  Sodium 135 - 145 mmol/L 136 135 133(L)  Potassium 3.5 - 5.1 mmol/L 5.0 5.7(H) 5.5(H)  Chloride 98 - 111 mmol/L 96(L) 91(L) 91(L)  CO2 22 - 32 mmol/L 24 20(L) 23  Calcium 8.9 - 10.3 mg/dL 8.3(L) 8.5(L) 8.1(L)   Outpatient Dialysis Orders:  MTTS [facility] 4hr, 400/A1.5, EDW 83.5kg, 2K/2Ca, UF profile 4, AVG, heparin 2000 bolus - Mircera 45mcg IV q 2 weeks (last 12/06/19) - Calcitriol 0.69mcg PO q HD  Last HD session as outpatient: 12/11/19 - ran 3 hours 9 min, left under EDW at 82.1  Assessment/Plan:  # End-stage renal disease - HD per his MTTS schedule - next tx on 1/11 -  lokelma x 1 given K 5.0 and shortened treatments  # COVID-19 infection - Therapies per primary team - dexamethasone and remdesivir  # Combined chronic systolic and diastolic chronic end-stage heart failure - optimize volume with HD as able - Status post cardiology evaluation with little therapy to add given his hypotension  # Chronic hypotension - on midodrine  # Anemia secondary to CKD - On ESA-last dosed on 1/2 as above  # Secondary hyperparathyroidism - Will continue calcitriol inpatient and will be administered at his dialysis unit upon discharge.  renal diet. On velphoro  David Desanctis, MD 12/14/2019 4:46 PM

## 2019-12-14 NOTE — TOC Transition Note (Signed)
Transition of Care [facility]) - CM/SW Discharge Note   Patient Details  Name: Jemar Paulsen MRN: [id number] Date of Birth: 1941/08/01  [facility]) CM/SW Contact:  Alberteen Sam, [place] Phone Number: [phone] 12/14/2019, 12:07 PM   Clinical Narrative:     CSW spoke with patient's spouse [name] regarding discharge planning and informed of PT recommendation for SNF for short term rehab. She is in agreement and gave CSW permission to fax out referrals for bed offers.   Potential barriers to SNF placement include HD patient and covid + status. Facility will be required to transport to and from dialysis.    Barriers to Discharge: Continued Medical Work up   Patient Goals and CMS Choice   CMS Medicare.gov Compare Post Acute Care list provided to:: Patient Represenative (must comment)(spouse [name]) Choice offered to / list presented to : Spouse  Discharge Placement                       Discharge Plan and Services     Post Acute Care Choice: Skilled Nursing Facility                               Social Determinants of Health (SDOH) Interventions     Readmission Risk Interventions Readmission Risk Prevention Plan 09/02/2019  Transportation Screening Complete  Medication Review (RN Care Manager) Complete  Palliative Care Screening Not Applicable  Some recent data might be hidden

## 2019-12-14 NOTE — Progress Notes (Signed)
ANTICOAGULATION CONSULT NOTE - Follow up  Pharmacy Consult for warfarin Indication: atrial fibrillation  Allergies  Allergen Reactions  . Dextromethorphan-Guaifenesin     UNKNOWN   . Tocotrienols     UNKNOWN   . Losartan Potassium Other (See Comments)    Causes constipation    Patient Measurements: Height: 6\' 2"  (188 cm) Weight: 174 lb 9.7 oz (79.2 kg) IBW/kg (Calculated) : 82.2  Vital Signs: Temp: 99.1 F (37.3 C) (01/10 0746) Temp Source: Axillary (01/10 0746) BP: 102/64 (01/10 0746) Pulse Rate: 75 (01/10 0746)  Labs: Recent Labs    12/11/19 1331 12/11/19 1430 12/11/19 1501 12/11/19 1551 12/12/19 0449 12/12/19 2221 12/13/19 0423 12/14/19 0400  HGB  --    < >  --   --  10.0* 10.2* 10.1* 9.7*  HCT  --    < >  --   --  30.9* 30.0* 30.4* 29.3*  PLT  --    < >  --   --  192 185 223 247  LABPROT  --   --   --   --  17.2*  --  20.4* 25.2*  INR  --   --   --   --  1.4*  --  1.8* 2.3*  CREATININE  --    < >  --    < >  --  9.98* 10.48* 8.44*  TROPONINIHS 255*  --  271*  --   --   --   --   --    < > = values in this interval not displayed.    Estimated Creatinine Clearance: 8.2 mL/min (A) (by C-G formula based on SCr of 8.44 mg/dL (H)).   Medical History: Past Medical History:  Diagnosis Date  . 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 Acute on chronic systolic and diastolic heart failure, NYHA class 4 ([place])   . Anemia, iron deficiency 11/15/2011  . Arthritis    "hands, right knee, feet" (02/21/2018)  . Atrial fibrillation ([place])   . Cancer [facility])    hx of prostate; s/p radioactive seed implant 10/2009 Dr Janice Norrie  . Cardiac arrest ([place]) 02/17/2018  . CHF (congestive heart failure) ([place])   . Chronic combined systolic and diastolic heart failure, NYHA class 3 ([place]) 06/2010   felt to be secondary to hypertensive cardiomyopathy  . Chronic lower back pain   . CKD (chronic kidney disease) stage V requiring chronic dialysis ([place])   . ESRD on dialysis [facility])    started 02/2018  [place]  . GERD (gastroesophageal reflux disease)   . Glaucoma    "I had surgery and dont have it any more"  . Gout    daily RX (02/21/2018)  . Heart murmur    "mild" per pt  . Hepatitis    years ago  . HIV (human immunodeficiency virus infection) ([place])   . HIV infection ([place])   . Hyperlipidemia   . Hypertension    followed by [facility] and Vascular (Dr Dani Gobble Croitoru)  . Hypertension   . Nonischemic cardiomyopathy ([place])   . Pneumonia 11/2017  . Prostate cancer ([place])   . Sinus bradycardia   . Sleep apnea    does not use a cpap  . Stroke [facility])    "mini stroke" years ago  . Wears glasses    Assessment: 22 yom presented to the ED with generalized weakness. Found to be COVID positive. He is on chronic warfarin for history of afib. INR is therapeutic today at 2.3.  Hgb is slightly low at 9.1 down from 10.1 yesterday, and platelets are WNL. D-dimer is elevated but trending down below 5. Pt has also been receiving heparin SQ VTE prophylaxis. No bleeding noted today. Warfarin initially managed by MD now pharmacy will be managing.   Of note, pt had low INR on 1/6 and coumadin clinic instructed him to take 10mg  that day them resume previous dose.   PTA warfarin dose 2.5mg  on Mon + Fri and 5mg  all other days  Goal of Therapy:  INR 2-3 Monitor platelets by anticoagulation protocol: Yes   Plan:  Warfarin 5mg  PO x 1 tonight Will discontinue heparin SQ now that patient has therapeutic INR Daily INR, monitor for s/sx of bleeding   Agnes Lawrence, PharmD PGY1 Pharmacy Resident

## 2019-12-14 NOTE — Progress Notes (Signed)
Daily Progress Note   Patient Name: David [name]       Date: 12/14/2019 DOB: December 26, 1940  Age: 79 y.o. MRN#: [id number] Attending Physician: David Hedge, MD Primary Care Physician: David Alar, NP Admit Date: 12/11/2019  Reason for Consultation/Follow-up: Establishing goals of care and Psychosocial/spiritual support  Subjective: Spoke with patient today.  He states he feels he is getting better from the COVID illness.  However, he seems very weak as I am speaking to him.  He uses few words and gives mostly one word answers.  I re-introduced Palliative to him and explained that we try to help people plan for what type of medical care they would and wound not want at the end of their lives.  He acknowledged that he has spoken with Palliative before.  I asked if he became very sick and his heart stopped and he stopped breathing and he died-would he want [place] to attempt to resuscitate him?  He replied yes.  I explained that there would only be a very small chance resuscitation would work and if it did he would be placed on life support.  He said yes he understood.  He asked how long he would be on life support.  I explained that usually after a few days if someone is not doing well on life support we talk with their family about taking them off.  Alternatively if they want to continue life support despite not doing well they may have to have a tracheostomy placed along with a PEG tube.  In his case he would also be on dialysis.  I explained that what I am describing is living the rest of his life on machines.  David [name] indicated that this was okay.  Finally I asked David [name] when he would want to be taken off of life support, and he replied "when I am doing bad ".  At this time I felt that  David [name] may be too fatigued to have this conversation today.  It seemed as though he did not understand that being on life support meant he was already "doing bad ".  I called his wife David [name] afterwards.  She was concerned by his response.  She feels her husband may not understand that he would not be at home rather he would be in  a facility if he decided to have long-term life support.  David [name] asked me to have a conversation with his 3 children.  They do not know his current health condition and she feels that it is important for them to be prepared for his eventual decline.  I agreed to a conference call with his 3 children when David [name] is able to arrange it.  Assessment: Patient appears to be stabilizing from sepsis and Covid illness.  The plan is for him to discharge to a skilled nursing facility   Patient Profile/HPI:  79 y.o. 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  He was found to be septic and positive for COVID 19.   Length of Stay: 3  Current Medications: Scheduled Meds:  . allopurinol  100 mg Oral Once per day on Mon Wed Fri  . amiodarone  200 mg Oral Daily  . vitamin C  500 mg Oral Daily  . bictegravir-emtricitabine-tenofovir AF  1 tablet Oral Daily  . brimonidine  1 drop Left Eye BID  . calcitRIOL  0.5 mcg Oral Q T,Th,Sa-HD  . Chlorhexidine Gluconate Cloth  6 each Topical Q0600  . dexamethasone  6 mg Oral Daily  . dorzolamide-timolol  1 drop Left Eye BID  . feeding supplement (NEPRO CARB STEADY)  237 mL Oral TID BM  . feeding supplement (PRO-STAT SUGAR FREE 64)  30 mL Oral BID  . insulin aspart  0-9 Units Subcutaneous Q4H  . insulin detemir  0.075 Units/kg Subcutaneous BID  . latanoprost  1 drop Left Eye QHS  . midodrine  10 mg Oral 3 times per day on Tue Thu  Sat  . montelukast  10 mg Oral QHS  . multivitamin  1 tablet Oral Q1400  . sucroferric oxyhydroxide  500 mg Oral TID WC  . warfarin  5 mg Oral ONCE-1800  . Warfarin - Pharmacist Dosing Inpatient   Does not apply q1800  . zinc sulfate  220 mg Oral Daily    Continuous Infusions: . sodium chloride    . sodium chloride    . remdesivir 100 mg in NS 100 mL 100 mg (12/14/19 1012)    PRN Meds: sodium chloride, sodium chloride, acetaminophen, alteplase, chlorpheniramine-HYDROcodone, heparin, lidocaine (PF), lidocaine-prilocaine, ondansetron **OR** ondansetron (ZOFRAN) IV, pentafluoroprop-tetrafluoroeth, senna-docusate, traMADol   Vital Signs: BP 102/65   Pulse 75   Temp 97.8 F (36.6 C) (Oral)   Resp (!) 25   Ht 6\' 2"  (1.88 m)   Wt 79.2 kg   SpO2 91%   BMI 22.42 kg/m  SpO2: SpO2: 91 % O2 Device: O2 Device: Room Air O2 Flow Rate:    Intake/output summary:   Intake/Output Summary (Last 24 hours) at 12/14/2019 1419 Last data filed at 12/14/2019 1010 Gross per 24 hour  Intake 615 ml  Output 0 ml  Net 615 ml   LBM: Last BM Date: 12/13/19 Baseline Weight: Weight: 81.6 kg Most recent weight: Weight: 79.2 kg       Palliative Assessment/Data: 40%      Patient Active Problem List   Diagnosis Date Noted  . Combined systolic and diastolic heart failure (David [name])   . ESRD on dialysis (David [name])   . COVID-19 12/11/2019  . Acute respiratory failure with hypoxemia (David [name]) 11/01/2019  . Hyperkalemia 09/02/2019  .  Unspecified open wound of right upper arm, initial encounter 06/21/2019  . Shortness of breath 04/12/2019  . 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 Dependence on renal dialysis (David [name]) 03/27/2019  . Hypertensive heart and chronic kidney disease with heart failure and with stage 5 chronic kidney disease, or end stage renal disease (David [name]) 03/27/2019  . Mesenteric ischemia (David [name])   . Colonic ulcer   . Right lower quadrant pain   . Abnormal CT of the abdomen   . Ischemic colitis (David [name])     . Colitis presumed infectious 03/09/2019  . Acute respiratory failure with hypoxia and hypercapnia (David [name]) 03/04/2019  . Pruritus, unspecified 02/21/2019  . Acute pulmonary edema (David [name]) 02/11/2019  . Pain, unspecified 01/21/2019  . Atrial fibrillation with RVR (David [name]) 12/05/2018  . Paroxysmal atrial fibrillation (David [name]) 11/16/2018  . LBBB (left bundle branch block) 11/16/2018  . Encounter for monitoring amiodarone therapy 11/16/2018  . Volume overload 11/01/2018  . Pulmonary edema 10/21/2018  . Gout 10/21/2018  . Hypertensive emergency 10/21/2018  . Chronic combined systolic and diastolic heart failure, NYHA class 3 ([place]) 10/21/2018  . Leukocytosis 10/21/2018  . Chills (without fever) 09/24/2018  . Gastroesophageal reflux disease 08/28/2018  . Chronic anticoagulation 04/03/2018  . CAD (coronary artery disease) 04/03/2018  . Palliative care encounter 03/27/2018  . Atrial fibrillation, chronic ([place]) 03/25/2018  . Anemia in chronic kidney disease 03/12/2018  . Coagulation defect, unspecified ([place]) 03/12/2018  . Secondary hyperparathyroidism of renal origin ([place]) 03/12/2018  . Palliative care by specialist   . Advance care planning   . Goals of care, counseling/discussion   . Respiratory arrest ([place])   . Acute on chronic systolic heart failure ([place])   . Chronic obstructive pulmonary disease ([name])   . HIV (human immunodeficiency virus infection) ([place])   . ESRD (end stage renal disease) ([place])   . Acute respiratory failure with hypoxia ([place]) 01/21/2018  . Pneumonia   . Aspiration pneumonia of both lower lobes due to regurgitated food ([place])   . Sepsis due to pneumonia ([place]) 11/22/2017  . AIDS ([place]) 05/14/2014  . 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 Gastroparesis 05/06/2014  . Protein-calorie malnutrition, severe ([place]) 05/05/2014  . ESRD on hemodialysis ([place]) 05/02/2014  . Hemodialysis-associated hypotension 05/02/2014  . Leaking of conjunctival drainage bleb 05/01/2014  . POAG (primary  open-angle glaucoma) 05/01/2014  . Loss of weight 04/29/2014  . Conjunctivitis 04/29/2014  . 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 Low back pain 04/09/2012  . Osteoarthritis of hip 12/29/2011  . Iron deficiency anemia 06/28/2011  . Macrocytic anemia 04/02/2011  . 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 Hyperlipidemia 11/30/2010  . Transient cerebral ischemia 11/30/2010  . HYPERGLYCEMIA 11/30/2010    Palliative Care Plan    Recommendations/Plan:  Recommend palliative care follow-up at skilled nursing facility after discharge  Patient is currently full code full scope  I will have a conversation with his wife and children today or tomorrow at his wife's request.  Goals of Care and Additional Recommendations:  Limitations on Scope of Treatment: Full Scope Treatment  Code Status:  Full code  Prognosis:   Unable to determine.  Patient is at high risk for acute decline given immunocompromised status, advanced heart failure -he is not a candidate for advanced heart failure therapies, end-stage kidney disease, history of prostate cancer, and current Covid 19 infection  Discharge Planning:  [place] for rehab with Palliative care service follow-up  Care plan was discussed with [place] bedside RN and Dr. Neysa Bonito of [facility]  as well as his wife David [name]  Thank you for allowing the Palliative Medicine Team to assist in the care of this patient.  Total time spent:  45 min.     Greater than 50%  of this time was spent counseling and coordinating care related to the above assessment and plan.  Florentina Jenny, PA-C Palliative Medicine  Please contact Palliative MedicineTeam phone at [phone] for questions and concerns between 7 am - 7 pm.   Please see AMION for individual provider pager numbers.

## 2019-12-14 NOTE — NC FL2 (Signed)
[place] LEVEL OF CARE SCREENING TOOL     IDENTIFICATION  Patient Name: David [name] Birthdate: September 14, 1941 Sex: male Admission Date (Current Location): 12/11/2019  [facility] and [place] Number:  Herbalist and Address:  [facility] [place]. [facility], [place] [address] [place], [place] [address]      Provider Number: [id number]  Attending Physician Name and Address:  [name], MD  Relative Name and Phone Number:  [name] (spouse) [phone]    Current Level of Care: Hospital Recommended Level of Care: [facility] Prior Approval Number:    Date Approved/Denied:   PASRR Number: [id number]  Discharge Plan: SNF    Current Diagnoses: Patient Active Problem List   Diagnosis Date Noted  . Combined systolic and diastolic heart failure ([place])   . ESRD on dialysis ([place])   . COVID-19 12/11/2019  . Acute respiratory failure with hypoxemia ([place]) 11/01/2019  . Hyperkalemia 09/02/2019  . Unspecified open wound of right upper arm, initial encounter 06/21/2019  . Shortness of breath 04/12/2019  . 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 Dependence on renal dialysis ([place]) 03/27/2019  . Hypertensive heart and chronic kidney disease with heart failure and with stage 5 chronic kidney disease, or end stage renal disease ([place]) 03/27/2019  . Mesenteric ischemia ([place])   . Colonic ulcer   . Right lower quadrant pain   . Abnormal CT of the abdomen   . Ischemic colitis ([place])   . Colitis presumed infectious 03/09/2019  . Acute respiratory failure with hypoxia and hypercapnia ([place]) 03/04/2019  . Pruritus, unspecified 02/21/2019  . Acute pulmonary edema ([place]) 02/11/2019  . Pain, unspecified 01/21/2019  . Atrial fibrillation with RVR ([place]) 12/05/2018  . Paroxysmal atrial fibrillation ([place]) 11/16/2018  . LBBB (left bundle branch block) 11/16/2018  . Encounter for monitoring amiodarone therapy 11/16/2018  . Volume overload 11/01/2018  . Pulmonary  edema 10/21/2018  . Gout 10/21/2018  . Hypertensive emergency 10/21/2018  . Chronic combined systolic and diastolic heart failure, NYHA class 3 ([place]) 10/21/2018  . Leukocytosis 10/21/2018  . Chills (without fever) 09/24/2018  . Gastroesophageal reflux disease 08/28/2018  . Chronic anticoagulation 04/03/2018  . CAD (coronary artery disease) 04/03/2018  . Palliative care encounter 03/27/2018  . Atrial fibrillation, chronic ([place]) 03/25/2018  . Anemia in chronic kidney disease 03/12/2018  . Coagulation defect, unspecified ([place]) 03/12/2018  . Secondary hyperparathyroidism of renal origin ([place]) 03/12/2018  . Palliative care by specialist   . Advance care planning   . Goals of care, counseling/discussion   . Respiratory arrest ([place])   . Acute on chronic systolic heart failure ([place])   . Chronic obstructive pulmonary disease ([place])   . HIV (human immunodeficiency virus infection) ([place])   . ESRD (end stage renal disease) ([place])   . Acute respiratory failure with hypoxia ([place]) 01/21/2018  . Pneumonia   . Aspiration pneumonia of both lower lobes due to regurgitated food ([place])   . Sepsis due to pneumonia ([place]) 11/22/2017  . AIDS ([place]) 05/14/2014  . 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 Gastroparesis 05/06/2014  . Protein-calorie malnutrition, severe ([place]) 05/05/2014  . ESRD on hemodialysis ([place]) 05/02/2014  . Hemodialysis-associated hypotension 05/02/2014  . Leaking of conjunctival drainage bleb 05/01/2014  . POAG (primary open-angle glaucoma) 05/01/2014  . Loss of weight 04/29/2014  . Conjunctivitis 04/29/2014  . 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 Low back pain 04/09/2012  . Osteoarthritis of hip 12/29/2011  . Iron deficiency anemia 06/28/2011  . Macrocytic anemia  04/02/2011  . 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 Hyperlipidemia 11/30/2010  . Transient cerebral ischemia 11/30/2010  . HYPERGLYCEMIA 11/30/2010    Orientation RESPIRATION BLADDER Height &  Weight     Self, Time, Situation, Place  Normal Continent Weight: 174 lb 9.7 oz (79.2 kg) Height:  6\' 2"  (188 cm)  BEHAVIORAL SYMPTOMS/MOOD NEUROLOGICAL BOWEL NUTRITION STATUS      Continent Diet(see discharge summary)  AMBULATORY STATUS COMMUNICATION OF NEEDS Skin   Extensive Assist Verbally Other (Comment)(right thigh closed surgical incision)                       Personal Care Assistance Level of Assistance  Bathing, Feeding, Dressing, Total care Bathing Assistance: Maximum assistance Feeding assistance: Limited assistance Dressing Assistance: Maximum assistance Total Care Assistance: Maximum assistance   Functional Limitations Info  Sight, Hearing, Speech Sight Info: Adequate Hearing Info: Adequate Speech Info: Adequate    SPECIAL CARE FACTORS FREQUENCY  PT (By licensed PT), OT (By licensed OT)     PT Frequency: min 5x weekly OT Frequency: min 5x weekly            Contractures Contractures Info: Not present    Additional Factors Info  Code Status, Allergies, Isolation Precautions Code Status Info: Full Allergies Info: Dextromethorphanguaifenesin, tocotrienol, losartan potassium     Isolation Precautions Info: Covid 19     Current Medications (12/14/2019):  This is the current hospital active medication list Current Facility-Administered Medications  Medication Dose Route Frequency Provider Last Rate Last Admin  . 0.9 %  sodium chloride infusion  100 mL Intravenous PRN Harrie Jeans C, MD      . 0.9 %  sodium chloride infusion  100 mL Intravenous PRN Claudia Desanctis, MD      . acetaminophen (TYLENOL) tablet 650 mg  650 mg Oral Q6H PRN [name], MD      . allopurinol (ZYLOPRIM) tablet 100 mg  100 mg Oral Once per day on Mon Wed Fri Segal, Jared E, MD      . alteplase (CATHFLO ACTIVASE) injection 2 mg  2 mg Intracatheter Once PRN Claudia Desanctis, MD      . amiodarone (PACERONE) tablet 200 mg  200 mg Oral Daily [name], MD   200 mg at 12/14/19 0955   . ascorbic acid (VITAMIN C) tablet 500 mg  500 mg Oral Daily [name], MD   500 mg at 12/14/19 0959  . bictegravir-emtricitabine-tenofovir AF ([name]) 50-200-25 MG per tablet 1 tablet  1 tablet Oral Daily [name], MD   1 tablet at 12/14/19 1004  . brimonidine (ALPHAGAN) 0.15 % ophthalmic solution 1 drop  1 drop Left Eye BID [name], MD   1 drop at 12/14/19 1013  . calcitRIOL (ROCALTROL) capsule 0.5 mcg  0.5 mcg Oral Q T,Th,Sa-HD Claudia Desanctis, MD   0.5 mcg at 12/13/19 1235  . Chlorhexidine Gluconate Cloth 2 % PADS 6 each  6 each Topical Q0600 Claudia Desanctis, MD   6 each at 12/14/19 [phone]  . chlorpheniramine-HYDROcodone (TUSSIONEX) 10-8 MG/5ML suspension 5 mL  5 mL Oral Q12H PRN [name], MD      . dexamethasone (DECADRON) tablet 6 mg  6 mg Oral Daily [name], MD   6 mg at 12/14/19 0955  . dorzolamide-timolol (COSOPT) 22.3-6.8 MG/ML ophthalmic solution 1 drop  1 drop Left Eye BID [name], MD   1 drop  at 12/14/19 1013  . feeding supplement (NEPRO CARB STEADY) liquid 237 mL  237 mL Oral TID BM [name], MD   237 mL at 12/13/19 2030  . feeding supplement (PRO-STAT SUGAR FREE 64) liquid 30 mL  30 mL Oral BID [name], MD   30 mL at 12/13/19 2030  . heparin injection 1,000 Units  1,000 Units Dialysis PRN Claudia Desanctis, MD      . insulin aspart (novoLOG) injection 0-9 Units  0-9 Units Subcutaneous Q4H [name], MD   1 Units at 12/13/19 2030  . insulin detemir (LEVEMIR) injection 6 Units  0.075 Units/kg Subcutaneous BID [name], MD   6 Units at 12/14/19 1004  . latanoprost (XALATAN) 0.005 % ophthalmic solution 1 drop  1 drop Left Eye QHS [name], MD   1 drop at 12/13/19 2215  . lidocaine (PF) (XYLOCAINE) 1 % injection 5 mL  5 mL Intradermal PRN Claudia Desanctis, MD      . lidocaine-prilocaine (EMLA) cream 1 application  1 application Topical PRN Claudia Desanctis, MD      . midodrine (PROAMATINE) tablet 10 mg  10 mg Oral 3 times per day  on Tue Thu Sat [name], MD   10 mg at 12/13/19 1806  . montelukast (SINGULAIR) tablet 10 mg  10 mg Oral QHS [name], MD   10 mg at 12/13/19 2214  . multivitamin (RENA-VIT) tablet 1 tablet  1 tablet Oral Q1400 [name], MD   1 tablet at 12/13/19 1805  . ondansetron (ZOFRAN) tablet 4 mg  4 mg Oral Q6H PRN [name], MD       Or  . ondansetron [facility]) injection 4 mg  4 mg Intravenous Q6H PRN [name], MD      . pentafluoroprop-tetrafluoroeth [facility]) aerosol 1 application  1 application Topical PRN Claudia Desanctis, MD      . remdesivir 100 mg in sodium chloride 0.9 % 100 mL IVPB  100 mg Intravenous Q24H Rumbarger, Rachel L, RPH 200 mL/hr at 12/14/19 1012 100 mg at 12/14/19 1012  . senna-docusate (Senokot-S) tablet 1 tablet  1 tablet Oral QHS PRN [name], MD      . sucroferric oxyhydroxide [facility]) chewable tablet 500 mg  500 mg Oral TID WC [name], MD   500 mg at 12/14/19 1004  . traMADol (ULTRAM) tablet 50 mg  50 mg Oral Q6H PRN [name], MD      . warfarin (COUMADIN) tablet 5 mg  5 mg Oral ONCE-1800 [name], MD      . Warfarin - Pharmacist Dosing Inpatient   Does not apply q1800 Rumbarger, Valeda Malm, RPH      . zinc sulfate capsule 220 mg  220 mg Oral Daily [name], MD   220 mg at 12/14/19 1004     Discharge Medications: Please see discharge summary for a list of discharge medications.  Relevant Imaging Results:  Relevant Lab Results:   Additional Information SSN: [id number]  Alberteen Sam, LCSW

## 2019-12-15 DIAGNOSIS — Z66 Do not resuscitate: Secondary | ICD-10-CM

## 2019-12-15 LAB — CBC WITH DIFFERENTIAL/PLATELET
Abs Immature Granulocytes: 0.1 10*3/uL — ABNORMAL HIGH (ref 0.00–0.07)
Basophils Absolute: 0 10*3/uL (ref 0.0–0.1)
Basophils Relative: 0 %
Eosinophils Absolute: 0 10*3/uL (ref 0.0–0.5)
Eosinophils Relative: 0 %
HCT: 28.8 % — ABNORMAL LOW (ref 39.0–52.0)
Hemoglobin: 9.8 g/dL — ABNORMAL LOW (ref 13.0–17.0)
Lymphocytes Relative: 8 %
Lymphs Abs: 0.6 10*3/uL — ABNORMAL LOW (ref 0.7–4.0)
MCH: 35.5 pg — ABNORMAL HIGH (ref 26.0–34.0)
MCHC: 34 g/dL (ref 30.0–36.0)
MCV: 104.3 fL — ABNORMAL HIGH (ref 80.0–100.0)
Metamyelocytes Relative: 2 %
Monocytes Absolute: 1.1 10*3/uL — ABNORMAL HIGH (ref 0.1–1.0)
Monocytes Relative: 15 %
Neutro Abs: 5.3 10*3/uL (ref 1.7–7.7)
Neutrophils Relative %: 75 %
Platelets: 289 10*3/uL (ref 150–400)
RBC: 2.76 MIL/uL — ABNORMAL LOW (ref 4.22–5.81)
RDW: 14 % (ref 11.5–15.5)
WBC: 7 10*3/uL (ref 4.0–10.5)
nRBC: 0.3 % — ABNORMAL HIGH (ref 0.0–0.2)
nRBC: 1 /100 WBC — ABNORMAL HIGH

## 2019-12-15 LAB — BASIC METABOLIC PANEL
Anion gap: 21 — ABNORMAL HIGH (ref 5–15)
BUN: 99 mg/dL — ABNORMAL HIGH (ref 8–23)
CO2: 21 mmol/L — ABNORMAL LOW (ref 22–32)
Calcium: 8.4 mg/dL — ABNORMAL LOW (ref 8.9–10.3)
Chloride: 94 mmol/L — ABNORMAL LOW (ref 98–111)
Creatinine, Ser: 11.24 mg/dL — ABNORMAL HIGH (ref 0.61–1.24)
GFR calc Af Amer: 4 mL/min — ABNORMAL LOW (ref >60)
GFR calc non Af Amer: 4 mL/min — ABNORMAL LOW (ref >60)
Glucose, Bld: 94 mg/dL (ref 70–99)
Potassium: 5.2 mmol/L — ABNORMAL HIGH (ref 3.5–5.1)
Sodium: 136 mmol/L (ref 135–145)

## 2019-12-15 LAB — GLUCOSE, CAPILLARY
Glucose-Capillary: 101 mg/dL — ABNORMAL HIGH (ref 70–99)
Glucose-Capillary: 104 mg/dL — ABNORMAL HIGH (ref 70–99)
Glucose-Capillary: 118 mg/dL — ABNORMAL HIGH (ref 70–99)
Glucose-Capillary: 197 mg/dL — ABNORMAL HIGH (ref 70–99)
Glucose-Capillary: 223 mg/dL — ABNORMAL HIGH (ref 70–99)

## 2019-12-15 LAB — PROTIME-INR
INR: 2.9 — ABNORMAL HIGH (ref 0.8–1.2)
Prothrombin Time: 30.4 seconds — ABNORMAL HIGH (ref 11.4–15.2)

## 2019-12-15 LAB — FERRITIN: Ferritin: 1725 ng/mL — ABNORMAL HIGH (ref 24–336)

## 2019-12-15 LAB — MAGNESIUM: Magnesium: 2.5 mg/dL — ABNORMAL HIGH (ref 1.7–2.4)

## 2019-12-15 LAB — C-REACTIVE PROTEIN: CRP: 11.9 mg/dL — ABNORMAL HIGH (ref <1.0)

## 2019-12-15 LAB — PHOSPHORUS: Phosphorus: 9.1 mg/dL — ABNORMAL HIGH (ref 2.5–4.6)

## 2019-12-15 LAB — D-DIMER, QUANTITATIVE: D-Dimer, Quant: 4.97 ug/mL-FEU — ABNORMAL HIGH (ref 0.00–0.50)

## 2019-12-15 MED ORDER — WARFARIN SODIUM 2.5 MG PO TABS
2.5000 mg | ORAL_TABLET | Freq: Once | ORAL | Status: AC
  Administered 2019-12-15: 2.5 mg via ORAL
  Filled 2019-12-15: qty 1

## 2019-12-15 MED ORDER — HEPARIN SODIUM (PORCINE) 1000 UNIT/ML IJ SOLN
INTRAMUSCULAR | Status: AC
  Filled 2019-12-15: qty 4

## 2019-12-15 NOTE — Progress Notes (Signed)
ANTICOAGULATION CONSULT NOTE - Follow up  Pharmacy Consult for warfarin Indication: atrial fibrillation  Allergies  Allergen Reactions  . Dextromethorphan-Guaifenesin     UNKNOWN   . Tocotrienols     UNKNOWN   . Losartan Potassium Other (See Comments)    Causes constipation    Patient Measurements: Height: 6\' 2"  (188 cm) Weight: 177 lb 7.5 oz (80.5 kg) IBW/kg (Calculated) : 82.2  Vital Signs: Temp: 98.6 F (37 C) (01/11 1000) Temp Source: Oral (01/11 1000) BP: 92/48 (01/11 1105) Pulse Rate: 68 (01/11 0430)  Labs: Recent Labs    12/13/19 0423 12/14/19 0400 12/15/19 0500  HGB 10.1* 9.7* 9.8*  HCT 30.4* 29.3* 28.8*  PLT 223 247 289  LABPROT 20.4* 25.2* 30.4*  INR 1.8* 2.3* 2.9*  CREATININE 10.48* 8.44* 11.24*    Estimated Creatinine Clearance: 6.3 mL/min (A) (by C-G formula based on SCr of 11.24 mg/dL (H)).   Medical History: Past Medical History:  Diagnosis Date  . 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 Acute on chronic systolic and diastolic heart failure, NYHA class 4 ([place])   . Anemia, iron deficiency 11/15/2011  . Arthritis    "hands, right knee, feet" (02/21/2018)  . Atrial fibrillation ([place])   . Cancer [facility])    hx of prostate; s/p radioactive seed implant 10/2009 Dr Janice Norrie  . Cardiac arrest ([place]) 02/17/2018  . CHF (congestive heart failure) ([place])   . Chronic combined systolic and diastolic heart failure, NYHA class 3 ([place]) 06/2010   felt to be secondary to hypertensive cardiomyopathy  . Chronic lower back pain   . CKD (chronic kidney disease) stage V requiring chronic dialysis ([place])   . ESRD on dialysis [facility])    started 02/2018 [facility]  . GERD (gastroesophageal reflux disease)   . Glaucoma    "I had surgery and dont have it any more"  . Gout    daily RX (02/21/2018)  . Heart murmur    "mild" per pt  . Hepatitis    years ago  . HIV (human immunodeficiency virus infection) ([place])   . HIV infection ([place])   .  Hyperlipidemia   . Hypertension    followed by [facility] and Vascular (Dr Dani Gobble Croitoru)  . Hypertension   . Nonischemic cardiomyopathy ([place])   . Pneumonia 11/2017  . Prostate cancer ([place])   . Sinus bradycardia   . Sleep apnea    does not use a cpap  . Stroke [facility])    "mini stroke" years ago  . Wears glasses    Assessment: 44 yom presented to the ED with generalized weakness. Found to be COVID positive. He is on chronic warfarin for history of afib. INR is therapeutic today at 2.9 but trending up [place]. CBC is stable. No bleeding noted today. Warfarin initially managed by MD now pharmacy will be managing.   Of note, pt had low INR on 1/6 and coumadin clinic instructed him to take 10mg  that day them resume previous dose.   PTA warfarin dose 2.5mg  on Mon + Fri and 5mg  all other days  Goal of Therapy:  INR 2-3 Monitor platelets by anticoagulation protocol: Yes   Plan:  Warfarin 2.5mg  PO x 1 tonight Daily INR, monitor for s/sx of bleeding   Toniya Rozar A. Levada Dy, PharmD, BCPS, FNKF Clinical Pharmacist [place] Please utilize Amion for appropriate phone number to reach the unit pharmacist ([place])

## 2019-12-15 NOTE — Progress Notes (Addendum)
[facility] Progress Note  Name: David [name] MRN: [id number] DOB: 06-Apr-1941  Chief Complaint:  weakness  Subjective:  Patient not examined today directly given COVID-19 + status, utilizing data taken from chart +/- discussions w/ providers and staff.    ----- Background on consult:  David [name] is a 79 y.o. male with a history of ESRD on hemodialysis Monday Tuesday Thursday Saturday at [facility] who presented to the hospital with weakness, chills, and feeling unwell several days. He was found to be Covid positive.  He was seen by cardiology on 1/6 and felt to have severe end-stage heart failure with minimal options for treatment due to hypotension despite his midodrine.     Intake/Output Summary (Last 24 hours) at 12/15/2019 1412 Last data filed at 12/15/2019 1240 Gross per 24 hour  Intake 535 ml  Output 1800 ml  Net -1265 ml    Vitals:  Vitals:   12/15/19 1200 12/15/19 1230 12/15/19 1240 12/15/19 1353  BP: (!) 88/50 (!) 96/50 (!) 106/53 (!) 91/56  Pulse:    81  Resp:    (!) 24  Temp:   98.2 F (36.8 C)   TempSrc:   Oral   SpO2:    93%  Weight:   78.7 kg   Height:         Physical Exam:  General:  Adult male in bed in NAD at rest  HEENT: Normocephalic atraumatic Neck: Supple trachea midline Heart: S1-S2 no rub appreciated Lungs: Diminished breath sounds on auscultation.  Patient is comfortable on room air Abdomen: Soft nontender nondistended Extremities: No pitting edema appreciated no cyanosis or clubbing Skin: No rash on extremities exposed Neuro: Alert and oriented x3 follows commands and provides history Psych normal mood and affect Access-right IJ tunneled catheter. Also with Right femoral graft with bruit and thrill   Medications reviewed   Labs:  BMP Latest Ref Rng & Units 12/15/2019 12/14/2019 12/13/2019  Glucose 70 - 99 mg/dL 94 97 111(H)  BUN 8 - 23 mg/dL 99(H) 59(H) 72(H)  Creatinine 0.61 - 1.24 mg/dL 11.24(H)  8.44(H) 10.48(H)  BUN/Creat Ratio 6 - 22 (calc) - - -  Sodium 135 - 145 mmol/L 136 136 135  Potassium 3.5 - 5.1 mmol/L 5.2(H) 5.0 5.7(H)  Chloride 98 - 111 mmol/L 94(L) 96(L) 91(L)  CO2 22 - 32 mmol/L 21(L) 24 20(L)  Calcium 8.9 - 10.3 mg/dL 8.4(L) 8.3(L) 8.5(L)   Outpatient HD:  [facility]   4h  400/1.5  83.5kg  2/2 bath P4  AVG thigh  Hep 2000 - Mircera 13mcg IV q 2 weeks (last 12/06/19) - Calcitriol 0.20mcg PO q HD   Assessment/Plan:  # End-stage renal disease - outpt HD is [facility] schedule, will do HD 3x/week here, prob MWF. HD today in progress. 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  renal diet. On velphoro  David Blazing, MD 12/15/2019 2:12 PM

## 2019-12-15 NOTE — Progress Notes (Signed)
Physical Therapy Treatment Patient Details Name: David [name] MRN: [id number] DOB: 1941/03/26 Today's Date: 12/15/2019    History of Present Illness Patient is a 79 year old male with history of combined systolic and diastolic heart failure (EF 20 to 25% 03/04/2019), iron deficiency anemia, atrial fibrillation on Coumadin only tolerates amiodarone, PEA arrest 02/2018, ESRD on HD [facility], GERD, HIV, prostate cancer, CVA, hypertension who presented to the ED on 1/7 with complaints of generalized weakness and tremors and not feeling well x5 days.  Found to be Covid +.    PT Comments    Pt has just returned from HD and is visibly fatigued but willing to work with therapy. Pt is limited by decreased strength and balance. Pt requires maxAx2 for coming to EoB. Once EoB requires progression from maxAx2 to modAx1 for maintaining seated balance. Pt agreeable to washing face. After sitting for about 8 minutes, pt wants to attempt to stand in [place]. With MaxAx2 for power up from bed surface to [facility]. Pt able to stand x2 with modAx2 from higher Stedy pads. RN and NT entered room and pt agreeable to get back to bed to be bathed. D/c plans remain appropriate at this time. PT will continue to follow acutely.      Follow Up Recommendations  SNF;Supervision/Assistance - 24 hour     Equipment Recommendations  Other (comment)(TBD)       Precautions / Restrictions Precautions Precautions: Fall Restrictions Weight Bearing Restrictions: No    Mobility  Bed Mobility Overal bed mobility: Needs Assistance Bed Mobility: Supine to Sit     Supine to sit: Max assist;+2 for physical assistance Sit to supine: Max assist;+2 for physical assistance   General bed mobility comments: maxA for moving LE off bed, pulling against therapist to bring hips to EoB, and bringing trunk to upright pt with increased posterior lean requiring assist from behind, pt requires max A to bring LE and trunk to bed  surface  Transfers Overall transfer level: Needs assistance   Transfers: Sit to/from Stand;Stand Pivot Transfers Sit to Stand: Mod assist;From elevated surface;+2 physical assistance;+2 safety/equipment;Max assist         General transfer comment: maxA for power up to [name] from bed surface, modAx2 for 2x sit>stand from elevated Stedy pads,       Balance Overall balance assessment: Needs assistance Sitting-balance support: Feet supported Sitting balance-Leahy Scale: Zero Sitting balance - Comments: Pt with heavy R posterior lean with sitting EoB, requiring max support on R side and max progressing to min guard for support from posterior  Postural control: Posterior lean;Right lateral lean Standing balance support: Bilateral upper extremity supported Standing balance-Leahy Scale: Zero Standing balance comment: pt requires outside support as well as B UE support on Stedy to maintain balance in standing                            Cognition Arousal/Alertness: Awake/alert Behavior During Therapy: Flat affect Overall Cognitive Status: Impaired/Different from baseline Area of Impairment: Attention;Following commands;Safety/judgement;Awareness                   Current Attention Level: Alternating   Following Commands: Follows one step commands with increased time;Follows multi-step commands with increased time       General Comments: pt continues to have slow processing but is able to follow commands with increased time and cuing, Pt obviously fatigued after HD.         General Comments General  comments (skin integrity, edema, etc.): VSS      Pertinent Vitals/Pain Pain Assessment: Faces Faces Pain Scale: Hurts a little bit Pain Location: generalized  Pain Descriptors / Indicators: Grimacing Pain Intervention(s): Limited activity within patient's tolerance;Monitored during session;Repositioned           PT Goals (current goals can now be found in  the care plan section) Acute Rehab PT Goals Patient Stated Goal: to feel better  PT Goal Formulation: With patient Time For Goal Achievement: 12/26/19 Potential to Achieve Goals: Good Progress towards PT goals: Not progressing toward goals - comment(very fatigued from HD)    Frequency    Min 3X/week      PT Plan Current plan remains appropriate    Co-evaluation PT/OT/SLP Co-Evaluation/Treatment: Yes   PT goals addressed during session: Mobility/safety with mobility;Balance        AM-PAC PT "6 Clicks" Mobility   Outcome Measure  Help needed turning from your back to your side while in a flat bed without using bedrails?: A Lot Help needed moving from lying on your back to sitting on the side of a flat bed without using bedrails?: Total Help needed moving to and from a bed to a chair (including a wheelchair)?: Total Help needed standing up from a chair using your arms (e.g., wheelchair or bedside chair)?: Total Help needed to walk in hospital room?: Total Help needed climbing 3-5 steps with a railing? : Total 6 Click Score: 7    End of Session Equipment Utilized During Treatment: Gait belt Activity Tolerance: Patient limited by fatigue;Patient limited by lethargy Patient left: in bed;with nursing/sitter in room(RN and NT to give pt bath) Nurse Communication: Mobility status PT Visit Diagnosis: Other abnormalities of gait and mobility (R26.89);Muscle weakness (generalized) (M62.81);Other symptoms and signs involving the nervous system (V69.450)     Time: 3888-2800 PT Time Calculation (min) (ACUTE ONLY): 46 min  Charges:  $Gait Training: 8-22 mins $Therapeutic Activity: 8-22 mins                     Lura Falor B. Migdalia Dk PT, DPT Acute Rehabilitation Services Pager [phone] Office [phone]    [place] 12/15/2019, 5:06 PM

## 2019-12-15 NOTE — Progress Notes (Addendum)
MEWS/VS Documentation      12/15/2019 1200 12/15/2019 1230 12/15/2019 1240 12/15/2019 1353   MEWS Score:  1  1  0  2   MEWS Score Color:  Green  Green  Green  Yellow   Resp:  --  --  --  (!) 24   Pulse:  --  --  --  81   BP:  (!) 88/50  (!) 96/50  (!) 106/53  (!) 91/56   Temp:  --  --  98.2 F (36.8 C)  --   Level of Consciousness:  --  Alert  --  --    Patient back from dialysis. No acute distress noted; patient denied any discomfort. MD aware of patient's RR. Will continue to monitor.

## 2019-12-15 NOTE — Progress Notes (Signed)
Daily Progress Note   Patient Name: David [name]       Date: 12/15/2019 DOB: May 09, 1941  Age: 79 y.o. MRN#: [id number] Attending Physician: [name], MD Primary Care Physician: [name], NP Admit Date: 12/11/2019  Reason for Consultation/Follow-up: Establishing goals of care and Psychosocial/spiritual support  Subjective: Max Sane called.  She talked to her husband in hemodialysis.  She asked him about DNR code status.  She explained that if he is on life support it means he will be in really bad shape and will not be able to come home.  Mr. Isidore told her he would not want to live like that.  If he suffered cardiac arrest he would want to be "let go".  Mrs. Wainwright was tearful on the phone.  She's concerned that since he is "talking like that" he may be getting closer to end of life.  I reassured her that a DNR is a protective measure at end of life.  It allows his last moments to be peaceful rather than traumatic.   Assessment: Patient with severe co morbidities.  In dialysis. Eating 30% of meals on 2.5L oxygen.  COVID +   Patient Profile/HPI:  79 y.o. 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  He was found to be septic and positive for COVID 19.     Length of Stay: 4  Current Medications: Scheduled Meds:  . allopurinol  100 mg Oral Once per day on Mon Wed Fri  . amiodarone  200 mg Oral Daily  . vitamin C  500 mg Oral Daily  . bictegravir-emtricitabine-tenofovir AF  1 tablet Oral Daily  . brimonidine  1 drop Left Eye BID  . calcitRIOL  0.5 mcg  Oral Q T,Th,Sa-HD  . Chlorhexidine Gluconate Cloth  6 each Topical Q0600  . Chlorhexidine Gluconate Cloth  6 each Topical Q0600  . dexamethasone  6 mg Oral Daily  . dorzolamide-timolol  1 drop Left Eye BID  . famotidine  10 mg Oral Daily  . feeding supplement (NEPRO CARB STEADY)  237 mL Oral TID BM  . feeding supplement (  PRO-STAT SUGAR FREE 64)  30 mL Oral BID  . heparin      . insulin aspart  0-9 Units Subcutaneous Q4H  . insulin detemir  0.075 Units/kg Subcutaneous BID  . latanoprost  1 drop Left Eye QHS  . midodrine  10 mg Oral 3 times per day on Tue Thu Sat  . midodrine  10 mg Oral 3 times per day on Mon  . montelukast  10 mg Oral QHS  . multivitamin  1 tablet Oral Q1400  . sucroferric oxyhydroxide  500 mg Oral TID WC  . warfarin  2.5 mg Oral ONCE-1800  . Warfarin - Pharmacist Dosing Inpatient   Does not apply q1800  . zinc sulfate  220 mg Oral Daily    Continuous Infusions: . sodium chloride    . sodium chloride    . remdesivir 100 mg in NS 100 mL 100 mg (12/14/19 1012)    PRN Meds: sodium chloride, sodium chloride, acetaminophen, alteplase, chlorpheniramine-HYDROcodone, heparin, lidocaine (PF), lidocaine-prilocaine, ondansetron **OR** ondansetron (ZOFRAN) IV, pentafluoroprop-tetrafluoroeth, senna-docusate, traMADol    Vital Signs: BP (!) 106/53 (BP Location: Left Arm)   Pulse 68   Temp 98.2 F (36.8 C) (Oral)   Resp 19   Ht 6\' 2"  (1.88 m)   Wt 78.7 kg   SpO2 97%   BMI 22.28 kg/m  SpO2: SpO2: 97 % O2 Device: O2 Device: Room Air O2 Flow Rate:    Intake/output summary:   Intake/Output Summary (Last 24 hours) at 12/15/2019 1338 Last data filed at 12/15/2019 1240 Gross per 24 hour  Intake 535 ml  Output 1800 ml  Net -1265 ml   LBM: Last BM Date: 12/14/19 Baseline Weight: Weight: 81.6 kg Most recent weight: Weight: 78.7 kg       Palliative Assessment/Data: 40%      Patient Active Problem List   Diagnosis Date Noted  . Combined systolic and diastolic  heart failure ([place])   . ESRD on dialysis ([place])   . COVID-19 12/11/2019  . Acute respiratory failure with hypoxemia ([place]) 11/01/2019  . Hyperkalemia 09/02/2019  . Unspecified open wound of right upper arm, initial encounter 06/21/2019  . Shortness of breath 04/12/2019  . 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 Dependence on renal dialysis ([place]) 03/27/2019  . Hypertensive heart and chronic kidney disease with heart failure and with stage 5 chronic kidney disease, or end stage renal disease ([place]) 03/27/2019  . Mesenteric ischemia ([place])   . Colonic ulcer   . Right lower quadrant pain   . Abnormal CT of the abdomen   . Ischemic colitis ([place])   . Colitis presumed infectious 03/09/2019  . Acute respiratory failure with hypoxia and hypercapnia ([place]) 03/04/2019  . Pruritus, unspecified 02/21/2019  . Acute pulmonary edema ([place]) 02/11/2019  . Pain, unspecified 01/21/2019  . Atrial fibrillation with RVR ([place]) 12/05/2018  . Paroxysmal atrial fibrillation ([place]) 11/16/2018  . LBBB (left bundle branch block) 11/16/2018  . Encounter for monitoring amiodarone therapy 11/16/2018  . Volume overload 11/01/2018  . Pulmonary edema 10/21/2018  . Gout 10/21/2018  . Hypertensive emergency 10/21/2018  . Chronic combined systolic and diastolic heart failure, NYHA class 3 ([place]) 10/21/2018  . Leukocytosis 10/21/2018  . Chills (without fever) 09/24/2018  . Gastroesophageal reflux disease 08/28/2018  . Chronic anticoagulation 04/03/2018  . CAD (coronary artery disease) 04/03/2018  . Palliative care encounter 03/27/2018  . Atrial fibrillation, chronic ([place]) 03/25/2018  . Anemia in chronic kidney disease 03/12/2018  . Coagulation defect, unspecified ([place]) 03/12/2018  .  Secondary hyperparathyroidism of renal origin ([place]) 03/12/2018  . Palliative care by specialist   . Advance care planning   . Goals of care, counseling/discussion   . Respiratory arrest ([place])   . Acute on chronic systolic heart failure ([place])   .  Chronic obstructive pulmonary disease ([place])   . HIV (human immunodeficiency virus infection) ([place])   . ESRD (end stage renal disease) ([place])   . Acute respiratory failure with hypoxia ([place]) 01/21/2018  . Pneumonia   . Aspiration pneumonia of both lower lobes due to regurgitated food ([place])   . Sepsis due to pneumonia ([place]) 11/22/2017  . AIDS ([place]) 05/14/2014  . 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 Gastroparesis 05/06/2014  . Protein-calorie malnutrition, severe ([place]) 05/05/2014  . ESRD on hemodialysis ([place]) 05/02/2014  . Hemodialysis-associated hypotension 05/02/2014  . Leaking of conjunctival drainage bleb 05/01/2014  . POAG (primary open-angle glaucoma) 05/01/2014  . Loss of weight 04/29/2014  . Conjunctivitis 04/29/2014  . 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 Low back pain 04/09/2012  . Osteoarthritis of hip 12/29/2011  . Iron deficiency anemia 06/28/2011  . Macrocytic anemia 04/02/2011  . 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 Hyperlipidemia 11/30/2010  . Transient cerebral ischemia 11/30/2010  . HYPERGLYCEMIA 11/30/2010    Palliative Care Plan    Recommendations/Plan:  Changed code status to DNR.  Patient appears to be stabilizing from [place] at this point.  PMT will continue to chart check and re-engage if he declines.  Please call if we are needed earlier.  Palliative Care to follow at SNF.  Goals of Care and Additional Recommendations:  Limitations on Scope of Treatment: Full Scope Treatment  Code Status:  DNR  Prognosis:  He is currently at high risk of acute decline and death given end stage heart failure, end stage renal disease, multiple recent surgeries and COVID 19 infection.  Discharge Planning:  [place] for rehab with Palliative care service follow-up  Care plan was discussed with wife  Thank you for allowing the Palliative Medicine Team to assist in the care of this patient.  Total time spent:  25  min.     Greater than 50%  of this time was spent counseling and coordinating care related to the above assessment and plan.  Florentina Jenny, PA-C Palliative Medicine  Please contact Palliative MedicineTeam phone at [phone] for questions and concerns between 7 am - 7 pm.   Please see AMION for individual provider pager numbers.

## 2019-12-15 NOTE — Consult Note (Signed)
   [facility] CM Inpatient Consult   12/15/2019  [place] 08/05/1941 [id number]   Patient is currently active with [facility] for chronic disease management services in the [name] Medicare HMO with [place] [employer] [ACO].  Patient has been engaged by a [facility].   Our community based plan of care has focused on disease management and community resource support.  Chart review reveals patient was admitted for COVID-19 positive.  Plan: Following for disposition and care management follow up needs, if appropriate. Will follow progress with Inpatient Transition Of Care [TOC] team and to make aware that [facility] following.   Of note, [facility] Care Management services does not replace or interfere with any services that are needed or arranged by inpatient [facility] care management team.  For additional questions or referrals please contact:  Natividad Brood, RN BSN [facility] Liaison  [phone] business mobile phone Toll free office [phone]  Fax number: [phone] [email or web address]

## 2019-12-15 NOTE — Progress Notes (Addendum)
Patient went to dialysis. Alert and oriented, no complaints at that time.

## 2019-12-15 NOTE — Progress Notes (Signed)
OT Cancellation Note  Patient Details Name: David [name] MRN: [id number] DOB: Oct 21, 1941   Cancelled Treatment:    Reason Eval/Treat Not Completed: Patient at procedure or test/ unavailable (HD), will follow up for OT treatment as schedule permits.  Lou Cal, OT Supplemental Rehabilitation Services Pager [phone] Office [phone]   [name] 12/15/2019, 11:49 AM

## 2019-12-15 NOTE — Progress Notes (Signed)
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  Initially not started on any Covid treatment as he was hemodynamically stable on room air however had increasing respiratory rate and CRP 22.9 on 1/8 and so was started on dexamethasone and remdesivir.  Actemra on hold due to HIV status.  Cardiology consulted for elevated troponin but not deemed a candidate for cardiac intervention, likely demand ischemia and no therapies recommended.  Nephrology was consulted due to ESRD and palliative care team consulted.   A & P   Active Problems:   COVID-19   Combined systolic and diastolic heart failure (HCC)   ESRD on dialysis ([place])   1. SIRS/failure to thrive, secondary to COVID-19 a. On admission: Febrile, lactic acidosis. increased respiratory rate to >30, vitals. Now on room air   b. D-dimer peaked at 7.4 -pharmacy consulted for Coumadin management.  Patient asymptomatic c. CRP peaked at 24, currently downtrending d. Day 4 dexamethasone, will stop today due to uremia (BUN 99) and tolerating room air e. Day 4/5 Remdesivir f. Palliative care consulted -patient made DNR on 1/11 after palliative discussion with wife g. Tylenol for fever h. Actemra held due to HIV status 2. Nausea/vomiting, possible uremia versus unknown medication induced 1. Continue Zofran 2. Added pepcid 3. Elevated troponin with extensive cardiac history (history of PEA arrest, combined  systolic/diastolic end stage heart failure), possibly demand ischemia in ESRD patient, stable a. Troponin 255->271 on admission b. Asymptomatic with baseline LBBB on EKG c. Cardiology consulted-no indication for cardiac intervention d. Palliative care consulted, as above e. Telemetry f. Poor prognosis, see Almyra Deforest, PA note from 12/10/19 for complete details 4. 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 Had left thigh AV graft placed by Dr. Eden Lathe on 11/10/2019, unfortunately this thrombosed and underwent thrombectomy by Dr. Carlis Abbott on 12/14.  Eventually underwent a right SFA to common femoral vein AV loop graft by Dr. Donzetta Matters 12/02/19 b. Nephrology consulted: HD per usual schedule 5. Hyperkalemia 1. Post Lokelma x1 per nephro 6. Chronic anemia a. At goal   b. on ESA 7. Combined systolic/diastolic end-stage heart failure a. Intolerant of beta-blockers in the past and other treatments b. No further options per cardiology c. Treat volume with HD d. I/O and daily weight e. Palliative care consulted 8. Chronic hypotension a. Continue midodrine 9. PAF on Coumadin and amiodarone 1. Coumadin per pharmacy 10. Chronic pain on tramadol 11. History of HIV on [name] 12. Prediabetes 13. Ambulatory dysfunction a. Uses cane and walker at baseline b. PT/OT recommending SNF at discharge  DVT prophylaxis: coumadin with subcutaneous heparin bridge Diet: renal  modified Family Communication: wife updated on phone at bedside yesterday Disposition Plan: Barrier to discharge is SNF placement.  Likely medically stable for discharge tomorrow pending renal function  Consultants  Cardiology Nephrology Palliative care  Procedures  None  Antibiotics   Anti-infectives (From admission, onward)   Start     Dose/Rate Route Frequency Ordered Stop   12/13/19 1000  remdesivir 100 mg in sodium  chloride 0.9 % 100 mL IVPB     100 mg 200 mL/hr over 30 Minutes Intravenous Every 24 hours 12/12/19 0724 12/17/19 0959   12/12/19 1000   bictegravir-emtricitabine-tenofovir AF ([name]) 50-200-25 MG per tablet 1 tablet     1 tablet Oral Daily 12/11/19 2024     12/12/19 0800  remdesivir 200 mg in sodium chloride 0.9% 250 mL IVPB     200 mg 580 mL/hr over 30 Minutes Intravenous Once 12/12/19 0724 12/12/19 1253           Subjective   Seen and examined at bedside post HD.  Denies any complaints at this time.  I attempted to ask him about his current health condition to evaluate capacity and patient was more concerned about discussing his morning medications to make sure that he received them in the morning.  He understood that he was in the hospital for COVID-19 and dialysis but continued to discuss his medications.  Denies any complaints at this time  Objective   Vitals:   12/14/19 1600 12/14/19 2016 12/15/19 0048 12/15/19 0430  BP: (!) 104/59 104/61  (!) 114/57  Pulse: 77 75 71 68  Resp: (!) 21 (!) 24 20 19   Temp: 98.9 F (37.2 C) 98.5 F (36.9 C)  98.5 F (36.9 C)  TempSrc: Axillary Oral  Oral  SpO2: 94% 95% 95% 97%  Weight:    80.5 kg  Height:        Intake/Output Summary (Last 24 hours) at 12/15/2019 0821 Last data filed at 12/15/2019 0809 Gross per 24 hour  Intake 775 ml  Output --  Net 775 ml   Filed Weights   12/13/19 1730 12/14/19 0351 12/15/19 0430  Weight: 80 kg 79.2 kg 80.5 kg    Examination:  Physical Exam Vitals and nursing note reviewed.  Constitutional:      General: He is not in acute distress. HENT:     Head: Normocephalic.     Mouth/Throat:     Mouth: Mucous membranes are moist.  Eyes:     Extraocular Movements: Extraocular movements intact.  Cardiovascular:     Rate and Rhythm: Normal rate and regular rhythm.  Pulmonary:     Effort: Pulmonary effort is normal. No respiratory distress.  Abdominal:     General: Abdomen is flat. There is no distension.  Musculoskeletal:        General: No swelling or tenderness.  Neurological:     Mental Status: He is alert and oriented to  person, place, and time.  Psychiatric:        Mood and Affect: Mood normal.     Data Reviewed: I have personally reviewed following labs and imaging studies  CBC: Recent Labs  Lab 12/11/19 1551 12/11/19 2024 12/12/19 0449 12/12/19 2221 12/13/19 0423 12/14/19 0400  WBC 5.5 4.9 6.0 4.2 5.4 6.6  NEUTROABS 4.0  --  4.2  --  3.5 4.7  HGB 9.6* 9.6* 10.0* 10.2* 10.1* 9.7*  HCT 29.5* 29.3* 30.9* 30.0* 30.4* 29.3*  MCV 109.7* 110.6* 109.6* 105.6* 107.0* 105.4*  PLT 144* 161 192 185 223 834   Basic Metabolic Panel: Recent Labs  Lab 12/11/19 1446 12/11/19 2024 12/12/19 0449 12/12/19 2221 12/13/19 0423 12/14/19 0400  NA 137  --   --  133* 135 136  K 4.3  --   --  5.5* 5.7* 5.0  CL 94*  --   --  91* 91* 96*  CO2 27  --   --  23 20*  24  GLUCOSE 106*  --   --  114* 111* 97  BUN 22  --   --  65* 72* 59*  CREATININE 6.08* 7.06*  --  9.98* 10.48* 8.44*  CALCIUM 8.2*  --   --  8.1* 8.5* 8.3*  MG 2.0  --  2.2  --  2.3 2.2  PHOS 3.9  --  6.4* 7.7* 8.1* 7.0*  6.9*   GFR: Estimated Creatinine Clearance: 8.3 mL/min (A) (by C-G formula based on SCr of 8.44 mg/dL (H)). Liver Function Tests: Recent Labs  Lab 12/11/19 1446 12/12/19 2221 12/14/19 0400  AST 68*  --   --   ALT 23  --   --   ALKPHOS 98  --   --   BILITOT 0.8  --   --   PROT 7.1  --   --   ALBUMIN 3.0* 2.7* 2.5*   No results for input(s): LIPASE, AMYLASE in the last 168 hours. No results for input(s): AMMONIA in the last 168 hours. Coagulation Profile: Recent Labs  Lab 12/10/19 1623 12/11/19 1430 12/12/19 0449 12/13/19 0423 12/14/19 0400  INR 1.2* 1.3* 1.4* 1.8* 2.3*   Cardiac Enzymes: No results for input(s): CKTOTAL, CKMB, CKMBINDEX, TROPONINI in the last 168 hours. BNP (last 3 results) No results for input(s): PROBNP in the last 8760 hours. HbA1C: Recent Labs    12/12/19 1800  HGBA1C 5.7*   CBG: Recent Labs  Lab 12/14/19 1556 12/14/19 2015 12/15/19 0016 12/15/19 0430 12/15/19 0810  GLUCAP  147* 121* 118* 104* 101*   Lipid Profile: No results for input(s): CHOL, HDL, LDLCALC, TRIG, CHOLHDL, LDLDIRECT in the last 72 hours. Thyroid Function Tests: No results for input(s): TSH, T4TOTAL, FREET4, T3FREE, THYROIDAB in the last 72 hours. Anemia Panel: Recent Labs    12/13/19 0423 12/14/19 0400  FERRITIN 2,058* 2,420*   Sepsis Labs: Recent Labs  Lab 12/11/19 1230 12/11/19 1331 12/11/19 2024  PROCALCITON  --   --  5.49  LATICACIDVEN 2.3* 1.1  --     Recent Results (from the past 240 hour(s))  Culture, blood (Routine X 2) w Reflex to ID Panel     Status: None (Preliminary result)   Collection Time: 12/11/19  9:00 PM   Specimen: BLOOD RIGHT HAND  Result Value Ref Range Status   Specimen Description BLOOD RIGHT HAND  Final   Special Requests   Final    BOTTLES DRAWN AEROBIC ONLY Blood Culture results may not be optimal due to an inadequate volume of blood received in culture bottles   Culture   Final    NO GROWTH 4 DAYS Performed at [facility], [place] [address] [place], [place] [address]    Report Status PENDING  Incomplete  MRSA PCR Screening     Status: None   Collection Time: 12/12/19 10:35 PM   Specimen: Nasal Mucosa; Nasopharyngeal  Result Value Ref Range Status   MRSA by PCR NEGATIVE NEGATIVE Final    Comment:        The GeneXpert MRSA Assay (FDA approved for NASAL specimens only), is one component of a comprehensive MRSA colonization surveillance program. It is not intended to diagnose MRSA infection nor to guide or monitor treatment for MRSA infections. Performed at [facility], [place] [address] [place], [place] [address]          Radiology Studies: No results found.      Scheduled Meds: . allopurinol  100 mg Oral Once per day on  Mon Wed Fri  . amiodarone  200 mg Oral Daily  . vitamin C  500 mg Oral Daily  . bictegravir-emtricitabine-tenofovir AF  1 tablet Oral Daily  . brimonidine  1 drop Left Eye BID  . calcitRIOL  0.5  mcg Oral Q T,Th,Sa-HD  . Chlorhexidine Gluconate Cloth  6 each Topical Q0600  . Chlorhexidine Gluconate Cloth  6 each Topical Q0600  . dexamethasone  6 mg Oral Daily  . dorzolamide-timolol  1 drop Left Eye BID  . famotidine  10 mg Oral Daily  . feeding supplement (NEPRO CARB STEADY)  237 mL Oral TID BM  . feeding supplement (PRO-STAT SUGAR FREE 64)  30 mL Oral BID  . insulin aspart  0-9 Units Subcutaneous Q4H  . insulin detemir  0.075 Units/kg Subcutaneous BID  . latanoprost  1 drop Left Eye QHS  . midodrine  10 mg Oral 3 times per day on Tue Thu Sat  . midodrine  10 mg Oral 3 times per day on Mon  . montelukast  10 mg Oral QHS  . multivitamin  1 tablet Oral Q1400  . sucroferric oxyhydroxide  500 mg Oral TID WC  . Warfarin - Pharmacist Dosing Inpatient   Does not apply q1800  . zinc sulfate  220 mg Oral Daily   Continuous Infusions: . sodium chloride    . sodium chloride    . remdesivir 100 mg in NS 100 mL 100 mg (12/14/19 1012)     LOS: 4 days    Time spent: 20 minutes with over 50% of the time coordinating the patient's care    [name], DO [facility] Pager [phone]  If 7PM-7AM, please contact night-coverage [email or web address] Password [email or web address] 12/15/2019, 8:21 AM

## 2019-12-15 NOTE — Progress Notes (Addendum)
Occupational Therapy Treatment Patient Details Name: David [name] MRN: [id number] DOB: 23-Oct-1941 Today's Date: 12/15/2019    History of present illness Patient is a 79 year old male with history of combined systolic and diastolic heart failure (EF 20 to 25% 03/04/2019), iron deficiency anemia, atrial fibrillation on Coumadin only tolerates amiodarone, PEA arrest 02/2018, ESRD on HD MTTS, GERD, HIV, prostate cancer, CVA, hypertension who presented to the ED on 1/7 with complaints of generalized weakness and tremors and not feeling well x5 days.  Found to be Covid +.   OT comments  Pt making slow progress towards OT goals, with increased fatigue today after having HD. Pt tolerated sitting EOB >15 min however requiring significant assist to maintain sitting balance (initially +2 assist progressed to mod-maxA+1). Pt tolerating standing trials at [facility] with +2 assist. VSS throughout on RA. Despite fatigue pt remains motivated to return to his PLOF. Will continue per POC at this time.   Follow Up Recommendations  SNF    Equipment Recommendations  None recommended by OT          Precautions / Restrictions Precautions Precautions: Fall Restrictions Weight Bearing Restrictions: No       Mobility Bed Mobility Overal bed mobility: Needs Assistance Bed Mobility: Supine to Sit     Supine to sit: Max assist;+2 for physical assistance Sit to supine: Max assist;+2 for physical assistance   General bed mobility comments: maxA for moving LE off bed, pulling against therapist to bring hips to EoB, and bringing trunk to upright pt with increased posterior lean requiring assist from behind, pt requires max A to bring LE and trunk to bed surface  Transfers Overall transfer level: Needs assistance   Transfers: Sit to/from Stand;Stand Pivot Transfers Sit to Stand: Mod assist;From elevated surface;+2 physical assistance;+2 safety/equipment;Max assist         General transfer comment:  maxA for power up to [facility] from bed surface, modAx2 for 2x sit>stand from elevated Stedy pads,     Balance Overall balance assessment: Needs assistance Sitting-balance support: Feet supported Sitting balance-Leahy Scale: Zero Sitting balance - Comments: Pt with heavy R posterior lean with sitting EoB, requiring max support on R side and max progressing to min guard for support from posterior  Postural control: Posterior lean;Right lateral lean Standing balance support: Bilateral upper extremity supported Standing balance-Leahy Scale: Zero Standing balance comment: pt requires outside support as well as B UE support on Stedy to maintain balance in standing                           ADL either performed or assessed with clinical judgement   ADL Overall ADL's : Needs assistance/impaired     Grooming: Moderate assistance;Sitting;Wash/dry face Grooming Details (indicate cue type and reason): assist for sitting balance EOB esp during ADL activity                             Functional mobility during ADLs: Moderate assistance;Maximal assistance;+2 for physical assistance;+2 for safety/equipment General ADL Comments: pt very fatigued post HD this session, tolerated sitting EOB >15 min given external assist     Vision       Perception     Praxis      Cognition Arousal/Alertness: Awake/alert Behavior During Therapy: Flat affect Overall Cognitive Status: Impaired/Different from baseline Area of Impairment: Attention;Following commands;Safety/judgement;Awareness  Current Attention Level: Alternating   Following Commands: Follows one step commands with increased time;Follows multi-step commands with increased time       General Comments: pt continues to have slow processing but is able to follow commands with increased time and cuing, Pt notably fatigued after HD.        Exercises     Shoulder Instructions       General  Comments VSS on RA    Pertinent Vitals/ Pain       Pain Assessment: Faces Faces Pain Scale: Hurts a little bit Pain Location: generalized  Pain Descriptors / Indicators: Grimacing Pain Intervention(s): Limited activity within patient's tolerance;Monitored during session;Repositioned  Home Living                                          Prior Functioning/Environment              Frequency  Min 2X/week        Progress Toward Goals  OT Goals(current goals can now be found in the care plan section)  Progress towards OT goals: Progressing toward goals  Acute Rehab OT Goals Patient Stated Goal: to feel better  OT Goal Formulation: With patient Time For Goal Achievement: 12/27/19 Potential to Achieve Goals: Good ADL Goals Pt Will Perform Grooming: with min assist;sitting Pt Will Perform Upper Body Bathing: with min assist;sitting Pt Will Perform Lower Body Bathing: with mod assist;sit to/from stand Pt Will Transfer to Toilet: with mod assist;stand pivot transfer;bedside commode Pt Will Perform Toileting - Clothing Manipulation and hygiene: with mod assist;sit to/from stand  Plan Discharge plan remains appropriate    Co-evaluation    PT/OT/SLP Co-Evaluation/Treatment: Yes Reason for Co-Treatment: For patient/therapist safety;To address functional/ADL transfers PT goals addressed during session: Mobility/safety with mobility;Balance OT goals addressed during session: ADL's and self-care      AM-PAC OT "6 Clicks" Daily Activity     Outcome Measure   Help from another person eating meals?: A Lot Help from another person taking care of personal grooming?: A Lot Help from another person toileting, which includes using toliet, bedpan, or urinal?: Total Help from another person bathing (including washing, rinsing, drying)?: A Lot Help from another person to put on and taking off regular upper body clothing?: Total Help from another person to put on and  taking off regular lower body clothing?: Total 6 Click Score: 9    End of Session Equipment Utilized During Treatment: Other (comment)(stedy)  OT Visit Diagnosis: Unsteadiness on feet (R26.81);Cognitive communication deficit (R41.841);Muscle weakness (generalized) (M62.81)   Activity Tolerance Patient tolerated treatment well   Patient Left in bed;with call bell/phone within reach;with nursing/sitter in room(RN and NT present)   Nurse Communication Mobility status        Time: 4827-0786 OT Time Calculation (min): 46 min  Charges: OT General Charges $OT Visit: 1 Visit OT Treatments $Self Care/Home Management : 8-22 mins  Lou Cal, OT Supplemental Rehabilitation Services Pager [phone] Office [phone]    [name] 12/15/2019, 5:29 PM

## 2019-12-15 NOTE — Progress Notes (Signed)
Patient refused to take Velphoro because is making him sick on his stomach. MD notified. Will continue to monitor.

## 2019-12-15 NOTE — Progress Notes (Signed)
PT Cancellation Note  Patient Details Name: David [name] MRN: [id number] DOB: [birth year]   Cancelled Treatment:    Reason Eval/Treat Not Completed: (P) Patient at procedure or test/unavailable Pt off floor for HD. PT will follow back this afternoon for treatment as able.   [name] B. Migdalia Dk PT, DPT Acute Rehabilitation Services Pager [phone] Office [phone]    [place] 12/15/2019, 1:16 PM

## 2019-12-16 ENCOUNTER — Inpatient Hospital Stay (HOSPITAL_COMMUNITY): Payer: Medicare HMO

## 2019-12-16 DIAGNOSIS — M7989 Other specified soft tissue disorders: Secondary | ICD-10-CM

## 2019-12-16 LAB — GLUCOSE, CAPILLARY
Glucose-Capillary: 147 mg/dL — ABNORMAL HIGH (ref 70–99)
Glucose-Capillary: 155 mg/dL — ABNORMAL HIGH (ref 70–99)
Glucose-Capillary: 155 mg/dL — ABNORMAL HIGH (ref 70–99)
Glucose-Capillary: 166 mg/dL — ABNORMAL HIGH (ref 70–99)
Glucose-Capillary: 320 mg/dL — ABNORMAL HIGH (ref 70–99)
Glucose-Capillary: 74 mg/dL (ref 70–99)

## 2019-12-16 LAB — CULTURE, BLOOD (ROUTINE X 2): Culture: NO GROWTH

## 2019-12-16 LAB — BASIC METABOLIC PANEL
Anion gap: 18 — ABNORMAL HIGH (ref 5–15)
BUN: 72 mg/dL — ABNORMAL HIGH (ref 8–23)
CO2: 25 mmol/L (ref 22–32)
Calcium: 8.9 mg/dL (ref 8.9–10.3)
Chloride: 95 mmol/L — ABNORMAL LOW (ref 98–111)
Creatinine, Ser: 8.63 mg/dL — ABNORMAL HIGH (ref 0.61–1.24)
GFR calc Af Amer: 6 mL/min — ABNORMAL LOW (ref >60)
GFR calc non Af Amer: 5 mL/min — ABNORMAL LOW (ref >60)
Glucose, Bld: 91 mg/dL (ref 70–99)
Potassium: 5 mmol/L (ref 3.5–5.1)
Sodium: 138 mmol/L (ref 135–145)

## 2019-12-16 LAB — CBC WITH DIFFERENTIAL/PLATELET
Abs Immature Granulocytes: 0.04 10*3/uL (ref 0.00–0.07)
Basophils Absolute: 0 10*3/uL (ref 0.0–0.1)
Basophils Relative: 0 %
Eosinophils Absolute: 0 10*3/uL (ref 0.0–0.5)
Eosinophils Relative: 0 %
HCT: 30.8 % — ABNORMAL LOW (ref 39.0–52.0)
Hemoglobin: 10.2 g/dL — ABNORMAL LOW (ref 13.0–17.0)
Immature Granulocytes: 1 %
Lymphocytes Relative: 14 %
Lymphs Abs: 1 10*3/uL (ref 0.7–4.0)
MCH: 35.1 pg — ABNORMAL HIGH (ref 26.0–34.0)
MCHC: 33.1 g/dL (ref 30.0–36.0)
MCV: 105.8 fL — ABNORMAL HIGH (ref 80.0–100.0)
Monocytes Absolute: 0.7 10*3/uL (ref 0.1–1.0)
Monocytes Relative: 10 %
Neutro Abs: 5.1 10*3/uL (ref 1.7–7.7)
Neutrophils Relative %: 75 %
Platelets: 283 10*3/uL (ref 150–400)
RBC: 2.91 MIL/uL — ABNORMAL LOW (ref 4.22–5.81)
RDW: 14 % (ref 11.5–15.5)
WBC: 6.8 10*3/uL (ref 4.0–10.5)
nRBC: 0.4 % — ABNORMAL HIGH (ref 0.0–0.2)

## 2019-12-16 LAB — C-REACTIVE PROTEIN: CRP: 10.4 mg/dL — ABNORMAL HIGH (ref <1.0)

## 2019-12-16 LAB — PROTIME-INR
INR: 3.7 — ABNORMAL HIGH (ref 0.8–1.2)
Prothrombin Time: 36.3 seconds — ABNORMAL HIGH (ref 11.4–15.2)

## 2019-12-16 LAB — D-DIMER, QUANTITATIVE: D-Dimer, Quant: 9.96 ug/mL-FEU — ABNORMAL HIGH (ref 0.00–0.50)

## 2019-12-16 LAB — MAGNESIUM: Magnesium: 2.2 mg/dL (ref 1.7–2.4)

## 2019-12-16 LAB — FERRITIN: Ferritin: 1098 ng/mL — ABNORMAL HIGH (ref 24–336)

## 2019-12-16 LAB — PHOSPHORUS: Phosphorus: 7.3 mg/dL — ABNORMAL HIGH (ref 2.5–4.6)

## 2019-12-16 MED ORDER — CHLORHEXIDINE GLUCONATE CLOTH 2 % EX PADS
6.0000 | MEDICATED_PAD | Freq: Every day | CUTANEOUS | Status: DC
  Administered 2019-12-17 – 2019-12-19 (×3): 6 via TOPICAL

## 2019-12-16 MED ORDER — SODIUM ZIRCONIUM CYCLOSILICATE 10 G PO PACK
10.0000 g | PACK | Freq: Three times a day (TID) | ORAL | Status: AC
  Administered 2019-12-16 (×2): 10 g via ORAL
  Filled 2019-12-16 (×2): qty 1

## 2019-12-16 NOTE — Progress Notes (Addendum)
Inpatient Diabetes Program Recommendations  AACE/ADA: New Consensus Statement on Inpatient Glycemic Control   Target Ranges:  Prepandial:   less than 140 mg/dL      Peak postprandial:   less than 180 mg/dL (1-2 hours)      Critically ill patients:  140 - 180 mg/dL   Results for THIMOTHY, BARRETTA (MRN [id number]) as of 12/16/2019 12:53  Ref. Range 12/15/2019 08:10 12/15/2019 16:49 12/15/2019 20:27 12/16/2019 00:17 12/16/2019 04:35 12/16/2019 08:01  Glucose-Capillary Latest Ref Range: 70 - 99 mg/dL 101 (H) 197 (H) 223 (H) 166 (H) 155 (H) 320 (H)   Review of Glycemic Control  Diabetes history: PreDM Outpatient Diabetes medications: None Current orders for Inpatient glycemic control: Levemir 6 units BID, Novolog 0-9 units Q4H  NOTE:  Patient last received Decadron 6 mg on 12/15/19. Noted glucose 320 mg/dl this morning and patient received Novolog correction and Levemir 6 units. Will follow; may need to decrease Levemir since steroids are no longer ordered.   Thanks, Barnie Alderman, RN, MSN, CDE Diabetes Coordinator Inpatient Diabetes Program [phone] (Team Pager from 8am to 5pm)

## 2019-12-16 NOTE — Progress Notes (Signed)
[facility] Progress Note  Name: David [name] MRN: [id number] DOB: 22-May-1941  Chief Complaint:  weakness  Subjective:  pateitn seen in room, no resp c/o's, no sob or CP, no abd pain.    ----- Background on consult:  David [name] is a 79 y.o. male with a history of ESRD on hemodialysis Monday Tuesday Thursday Saturday at [facility] who presented to the hospital with weakness, chills, and feeling unwell several days. He was found to be Covid positive.  He was seen by cardiology on 1/6 and felt to have severe end-stage heart failure with minimal options for treatment due to hypotension despite his midodrine.     Intake/Output Summary (Last 24 hours) at 12/16/2019 1159 Last data filed at 12/16/2019 1127 Gross per 24 hour  Intake 940 ml  Output 1800 ml  Net -860 ml    Vitals:  Vitals:   12/15/19 2024 12/15/19 2200 12/16/19 0018 12/16/19 0435  BP: 109/64  108/61 109/62  Pulse: 73 71 71 69  Resp: (!) 23 16 20 20   Temp: 98.1 F (36.7 C)  98 F (36.7 C) 98.1 F (36.7 C)  TempSrc: Oral  Oral Oral  SpO2: 94% 97% 94% 95%  Weight:    80.2 kg  Height:         Physical Exam:  General:  Adult male in bed in NAD at rest  Heart: S1-S2 no rub appreciated Lungs: CTA bilat, on RA Abdomen: Soft nontender nondistended Extremities: No pitting edema  Neuro: Alert and oriented x3  Access-right IJ tunneled catheter/ Right femoral graft with bruit and thrill   Medications reviewed   Labs:  BMP Latest Ref Rng & Units 12/16/2019 12/15/2019 12/14/2019  Glucose 70 - 99 mg/dL 91 94 97  BUN 8 - 23 mg/dL 72(H) 99(H) 59(H)  Creatinine 0.61 - 1.24 mg/dL 8.63(H) 11.24(H) 8.44(H)  BUN/Creat Ratio 6 - 22 (calc) - - -  Sodium 135 - 145 mmol/L 138 136 136  Potassium 3.5 - 5.1 mmol/L 5.0 5.2(H) 5.0  Chloride 98 - 111 mmol/L 95(L) 94(L) 96(L)  CO2 22 - 32 mmol/L 25 21(L) 24  Calcium 8.9 - 10.3 mg/dL 8.9 8.4(L) 8.3(L)   Outpatient HD:  [facility]   4h  400/1.5   83.5kg  2/2 bath P4  AVG thigh  Hep 2000 - Mircera 43mcg IV q 2 weeks (last 12/06/19) - Calcitriol 0.62mcg PO q HD   Assessment/Plan:  # End-stage renal disease - outpt HD is MTTS schedule, will do HD 3x/week here,  MWF at this time. Had HD yest, will plan HD tomorrow / Wed.  - lokelma x 1 given K 5.0 and shortened treatments  # COVID-19 infection - Therapies per primary team - dexamethasone and remdesivir  # Combined chronic systolic and diastolic chronic end-stage heart failure - optimize volume with HD as able - Status post cardiology evaluation with little therapy to add given his hypotension - is 3kg under dry wt, no vol ^ on exam  # DNR - appreciate pall care assistance  # Chronic hypotension - on midodrine  # Anemia secondary to CKD - On ESA-last dosed on 1/2 as above  # Secondary hyperparathyroidism - Will continue calcitriol inpatient and will be administered at his dialysis unit upon discharge.  renal diet. On velphoro  Sol Blazing, MD 12/16/2019 11:59 AM

## 2019-12-16 NOTE — Progress Notes (Signed)
ANTICOAGULATION CONSULT NOTE - Follow up  Pharmacy Consult for warfarin Indication: atrial fibrillation  Allergies  Allergen Reactions  . Dextromethorphan-Guaifenesin     UNKNOWN   . Tocotrienols     UNKNOWN   . Losartan Potassium Other (See Comments)    Causes constipation    Patient Measurements: Height: 6\' 2"  (188 cm) Weight: 176 lb 12.9 oz (80.2 kg) IBW/kg (Calculated) : 82.2  Vital Signs: Temp: 98.1 F (36.7 C) (01/12 0435) Temp Source: Oral (01/12 0435) BP: 109/62 (01/12 0435) Pulse Rate: 69 (01/12 0435)  Labs: Recent Labs    12/14/19 0400 12/15/19 0500 12/16/19 0500  HGB 9.7* 9.8* 10.2*  HCT 29.3* 28.8* 30.8*  PLT 247 289 283  LABPROT 25.2* 30.4* 36.3*  INR 2.3* 2.9* 3.7*  CREATININE 8.44* 11.24* 8.63*    Estimated Creatinine Clearance: 8.1 mL/min (A) (by C-G formula based on SCr of 8.63 mg/dL (H)).   Medical History: Past Medical History:  Diagnosis Date  . 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 Acute on chronic systolic and diastolic heart failure, NYHA class 4 ([place])   . Anemia, iron deficiency 11/15/2011  . Arthritis    "hands, right knee, feet" (02/21/2018)  . Atrial fibrillation ([place])   . Cancer [facility])    hx of prostate; s/p radioactive seed implant 10/2009 Dr Janice Norrie  . Cardiac arrest ([place]) 02/17/2018  . CHF (congestive heart failure) ([place])   . Chronic combined systolic and diastolic heart failure, NYHA class 3 ([place]) 06/2010   felt to be secondary to hypertensive cardiomyopathy  . Chronic lower back pain   . CKD (chronic kidney disease) stage V requiring chronic dialysis ([place])   . ESRD on dialysis [facility])    started 02/2018 [place]  . GERD (gastroesophageal reflux disease)   . Glaucoma    "I had surgery and dont have it any more"  . Gout    daily RX (02/21/2018)  . Heart murmur    "mild" per pt  . Hepatitis    years ago  . HIV (human immunodeficiency virus infection) ([place])   . HIV infection ([place])   .  Hyperlipidemia   . Hypertension    followed by [facility] (Dr Dani Gobble Croitoru)  . Hypertension   . Nonischemic cardiomyopathy ([place])   . Pneumonia 11/2017  . Prostate cancer ([place])   . Sinus bradycardia   . Sleep apnea    does not use a cpap  . Stroke [facility])    "mini stroke" years ago  . Wears glasses    Assessment: 32 yom presented to the ED with generalized weakness. Found to be COVID positive. He is on chronic warfarin for history of afib. INR is supratherapeutic today at 3.7 and trending up [place]. CBC is stable. No bleeding noted today. Warfarin initially managed by MD now pharmacy will be managing.   Of note, pt had low INR on 1/6 and coumadin clinic instructed him to take 10mg  that day them resume previous dose.   PTA warfarin dose 2.5mg  on Mon + Fri and 5mg  all other days  Goal of Therapy:  INR 2-3 Monitor platelets by anticoagulation protocol: Yes   Plan:  Hold Warfarin tonight Daily INR, monitor for s/sx of bleeding   Mia Milan A. Levada Dy, PharmD, BCPS, FNKF Clinical Pharmacist [facility] [place] Please utilize Amion for appropriate phone number to reach the unit pharmacist ([place])

## 2019-12-16 NOTE — Progress Notes (Signed)
Writer called HD and asked if the patient will have his treatment today. Per HD staff patient is not on the list for treatment today. Will continue to monitor.

## 2019-12-16 NOTE — Progress Notes (Signed)
PROGRESS NOTE    [facility]    Code Status: DNR  DEY:[id number] DOB: 04/11/1941 DOA: 12/11/2019  PCP: [name], NP    Hospital Summary  This is a 79 year old male with multiple comorbidities including end-stage combined systolic and diastolic heart failure, iron deficiency anemia, atrial fibrillation on Coumadin and amiodarone, PEA arrest March 2019, ESRD on HD, GERD, HIV, prostate cancer, CVA, hypertension who presented to the ED on 1/7 with chief complaint generalized weakness, tremors from dialysis (did not complete treatment) and found to be Covid +1/7.  Initially not started on any Covid treatment as he was hemodynamically stable on room air however had increasing respiratory rate and CRP 22.9 on 1/8 and so was started on dexamethasone and remdesivir.  Actemra on hold due to HIV status.  Cardiology consulted for elevated troponin but not deemed a candidate for cardiac intervention, likely demand ischemia and no therapies recommended.  Nephrology was consulted due to ESRD and palliative care team consulted.   A & P   Active Problems:   COVID-19   Combined systolic and diastolic heart failure (HCC)   ESRD on dialysis ([place])   DNR (do not resuscitate)   1. SIRS/failure to thrive, secondary to COVID-19, SIRS resolved a. On admission: Febrile, lactic acidosis. increased respiratory rate to >30, vitals. Now on room air   b. CRP peaked at 24, currently downtrending, did not get actemra due to HIV status c. Completed 4 days dexamethasone, stopped 1/11 due to uremia (BUN 99) and tolerating room air d. Day 5/5 Remdesivir e. Palliative care consulted -patient made DNR on 1/11 after palliative discussion with wife f. Tylenol for fever 2. Nausea/vomiting, possible uremia versus unknown medication induced, resolved 1. Continue Zofran 2. Added pepcid 3. Elevated D-dimer 1. Initially 7.4, down trended to 4.9, increased to 9.9 today despite therapeutic Coumadin  levels 2. Tolerating room air, no need for chest imaging at this point 3. will check bilateral upper and lower extremity Dopplers 4. Elevated troponin with extensive cardiac history (history of PEA arrest, combined systolic/diastolic end stage heart failure), possibly demand ischemia in ESRD patient, stable a. Troponin 255->271 on admission b. Asymptomatic with baseline LBBB on EKG c. Cardiology consulted-no indication for cardiac intervention d. Palliative care consulted, as above e. Telemetry f. Poor prognosis, see Almyra Deforest, PA note from 12/10/19 for complete details 5. 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 Had left thigh AV graft placed by Dr. Eden Lathe on 11/10/2019, unfortunately this thrombosed and underwent thrombectomy by Dr. Carlis Abbott on 12/14.  Eventually underwent a right SFA to common femoral vein AV loop graft by Dr. Donzetta Matters 12/02/19 b. Nephrology consulted: HD tomorrow 6. Hyperkalemia 1. Post Lokelma x1 per nephro 7. 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 On ESA, last dose 1/2 8. Combined systolic/diastolic end-stage heart failure a. Intolerant of beta-blockers in the past and other treatments b. No further options per cardiology c. Treat volume with HD d. I/O and daily weight e. Palliative care consulted 9. Chronic hypotension a. Continue midodrine 10. PAF on Coumadin and amiodarone 1. Coumadin per pharmacy 11. Chronic pain on tramadol 12. History of HIV on [name] 13. Prediabetes 14. Ambulatory dysfunction a. Uses cane and walker at baseline b. PT/OT recommending SNF at discharge  DVT prophylaxis: coumadin with subcutaneous heparin bridge Diet: renal  modified Family Communication: wife updated on phone at bedside yesterday Disposition Plan: Barrier to discharge is SNF placement and workup of elevated D Dimer  Consultants  Cardiology Nephrology Palliative care  Procedures  None  Antibiotics   Anti-infectives (From admission, onward)   Start     Dose/Rate Route Frequency Ordered Stop    12/13/19 1000  remdesivir 100 mg in sodium chloride 0.9 % 100 mL IVPB     100 mg 200 mL/hr over 30 Minutes Intravenous Every 24 hours 12/12/19 0724 12/16/19 1152   12/12/19 1000  bictegravir-emtricitabine-tenofovir AF ([name]) 50-200-25 MG per tablet 1 tablet     1 tablet Oral Daily 12/11/19 2024     12/12/19 0800  remdesivir 200 mg in sodium chloride 0.9% 250 mL IVPB     200 mg 580 mL/hr over 30 Minutes Intravenous Once 12/12/19 0724 12/12/19 1253           Subjective   Patient seen and examined at bedside no acute distress and resting comfortably.  No events overnight.  Tolerating diet.  Denies any chest pain, shortness of breath, fever, nausea, vomiting, urinary complaints.  Admits to having bowel movement.  Otherwise ROS negative   Objective   Vitals:   12/15/19 2024 12/15/19 2200 12/16/19 0018 12/16/19 0435  BP: 109/64  108/61 109/62  Pulse: 73 71 71 69  Resp: (!) 23 16 20 20   Temp: 98.1 F (36.7 C)  98 F (36.7 C) 98.1 F (36.7 C)  TempSrc: Oral  Oral Oral  SpO2: 94% 97% 94% 95%  Weight:    80.2 kg  Height:        Intake/Output Summary (Last 24 hours) at 12/16/2019 1500 Last data filed at 12/16/2019 1127 Gross per 24 hour  Intake 840 ml  Output --  Net 840 ml   Filed Weights   12/15/19 1000 12/15/19 1240 12/16/19 0435  Weight: 80.5 kg 78.7 kg 80.2 kg    Examination:  Physical Exam Vitals and nursing note reviewed.  Constitutional:      Appearance: He is not ill-appearing.  HENT:     Head: Normocephalic.  Eyes:     Extraocular Movements: Extraocular movements intact.  Cardiovascular:     Rate and Rhythm: Normal rate and regular rhythm.  Pulmonary:     Effort: Pulmonary effort is normal.     Breath sounds: Normal breath sounds.  Abdominal:     General: Abdomen is flat.  Musculoskeletal:        General: No tenderness.  Neurological:     Mental Status: He is alert. Mental status is at baseline.  Psychiatric:        Mood and Affect: Mood  normal.        Behavior: Behavior normal.     Data Reviewed: I have personally reviewed following labs and imaging studies  CBC: Recent Labs  Lab 12/12/19 0449 12/12/19 2221 12/13/19 0423 12/14/19 0400 12/15/19 0500 12/16/19 0500  WBC 6.0 4.2 5.4 6.6 7.0 6.8  NEUTROABS 4.2  --  3.5 4.7 5.3 5.1  HGB 10.0* 10.2* 10.1* 9.7* 9.8* 10.2*  HCT 30.9* 30.0* 30.4* 29.3* 28.8* 30.8*  MCV 109.6* 105.6* 107.0* 105.4* 104.3* 105.8*  PLT 192 185 223 247 289 419   Basic Metabolic Panel: Recent Labs  Lab 12/11/19 1446 12/12/19 0449 12/12/19 2221 12/13/19 0423 12/14/19 0400 12/15/19 0500 12/16/19 0500  NA  --   --  133* 135 136 136 138  K  --   --  5.5* 5.7* 5.0 5.2* 5.0  CL  --   --  91* 91* 96* 94* 95*  CO2  --   --  23 20* 24 21* 25  GLUCOSE  --   --  114* 111* 97 94 91  BUN  --   --  65* 72* 59* 99* 72*  CREATININE   < >  --  9.98* 10.48* 8.44* 11.24* 8.63*  CALCIUM  --   --  8.1* 8.5* 8.3* 8.4* 8.9  MG  --  2.2  --  2.3 2.2 2.5* 2.2  PHOS   < > 6.4* 7.7* 8.1* 7.0*  6.9* 9.1* 7.3*   < > = values in this interval not displayed.   GFR: Estimated Creatinine Clearance: 8.1 mL/min (A) (by C-G formula based on SCr of 8.63 mg/dL (H)). Liver Function Tests: Recent Labs  Lab 12/11/19 1446 12/12/19 2221 12/14/19 0400  AST 68*  --   --   ALT 23  --   --   ALKPHOS 98  --   --   BILITOT 0.8  --   --   PROT 7.1  --   --   ALBUMIN 3.0* 2.7* 2.5*   No results for input(s): LIPASE, AMYLASE in the last 168 hours. No results for input(s): AMMONIA in the last 168 hours. Coagulation Profile: Recent Labs  Lab 12/12/19 0449 12/13/19 0423 12/14/19 0400 12/15/19 0500 12/16/19 0500  INR 1.4* 1.8* 2.3* 2.9* 3.7*   Cardiac Enzymes: No results for input(s): CKTOTAL, CKMB, CKMBINDEX, TROPONINI in the last 168 hours. BNP (last 3 results) No results for input(s): PROBNP in the last 8760 hours. HbA1C: No results for input(s): HGBA1C in the last 72 hours. CBG: Recent Labs  Lab  12/15/19 2027 12/16/19 0017 12/16/19 0435 12/16/19 0801 12/16/19 1312  GLUCAP 223* 166* 155* 320* 74   Lipid Profile: No results for input(s): CHOL, HDL, LDLCALC, TRIG, CHOLHDL, LDLDIRECT in the last 72 hours. Thyroid Function Tests: No results for input(s): TSH, T4TOTAL, FREET4, T3FREE, THYROIDAB in the last 72 hours. Anemia Panel: Recent Labs    12/15/19 0500 12/16/19 0500  FERRITIN 1,725* 1,098*   Sepsis Labs: Recent Labs  Lab 12/11/19 1230 12/11/19 1331 12/11/19 2024  PROCALCITON  --   --  5.49  LATICACIDVEN 2.3* 1.1  --     Recent Results (from the past 240 hour(s))  Culture, blood (Routine X 2) w Reflex to ID Panel     Status: None   Collection Time: 12/11/19  9:00 PM   Specimen: BLOOD RIGHT HAND  Result Value Ref Range Status   Specimen Description BLOOD RIGHT HAND  Final   Special Requests   Final    BOTTLES DRAWN AEROBIC ONLY Blood Culture results may not be optimal due to an inadequate volume of blood received in culture bottles   Culture   Final    NO GROWTH 5 DAYS Performed at [facility], [place] [address] [place], [place] [address]    Report Status 12/16/2019 FINAL  Final  MRSA PCR Screening     Status: None   Collection Time: 12/12/19 10:35 PM   Specimen: Nasal Mucosa; Nasopharyngeal  Result Value Ref Range Status   MRSA by PCR NEGATIVE NEGATIVE Final    Comment:        The GeneXpert MRSA Assay (FDA approved for NASAL specimens only), is one component of a comprehensive MRSA colonization surveillance program. It is not intended to diagnose MRSA infection nor to guide or monitor treatment for MRSA infections. Performed at [facility], [place] [address], [place], [place] [address]          Radiology Studies: VAS [place] UPPER EXTREMITY VENOUS DUPLEX  Result Date: 12/16/2019 UPPER  VENOUS STUDY  Indications: Swelling Limitations: Previous surgeries and bandages. Performing Technologist: Antonieta Pert RDMS, RVT  Examination  Guidelines: A complete evaluation includes B-mode imaging, spectral Doppler, color Doppler, and power Doppler as needed of all accessible portions of each vessel. Bilateral testing is considered an integral part of a complete examination. Limited examinations for reoccurring indications may be performed as noted.  Right Findings: +----------+------------+---------+-----------+----------+-------------------+ RIGHT     CompressiblePhasicitySpontaneousProperties      Summary       +----------+------------+---------+-----------+----------+-------------------+ IJV           Full       Yes       Yes                                  +----------+------------+---------+-----------+----------+-------------------+ Subclavian    Full                                  Not well visualized +----------+------------+---------+-----------+----------+-------------------+ Axillary      Full       Yes       Yes                                  +----------+------------+---------+-----------+----------+-------------------+ Brachial      Full       Yes       Yes                                  +----------+------------+---------+-----------+----------+-------------------+ Radial        Full                                                      +----------+------------+---------+-----------+----------+-------------------+ Ulnar         Full                                                      +----------+------------+---------+-----------+----------+-------------------+ Cephalic      Full                                     forearm only     +----------+------------+---------+-----------+----------+-------------------+ Basilic       Full       Yes       Yes                                  +----------+------------+---------+-----------+----------+-------------------+ Subclavian vein not well visualized secondary to port bandage.  Left Findings:  +----------+------------+---------+-----------+----------+--------------+ LEFT      CompressiblePhasicitySpontaneousProperties   Summary     +----------+------------+---------+-----------+----------+--------------+ IJV           Full       Yes       Yes                             +----------+------------+---------+-----------+----------+--------------+  Subclavian    Full       Yes       Yes                             +----------+------------+---------+-----------+----------+--------------+ Axillary      Full       Yes       Yes                             +----------+------------+---------+-----------+----------+--------------+ Brachial      Full       Yes       Yes                             +----------+------------+---------+-----------+----------+--------------+ Radial        Full                                                 +----------+------------+---------+-----------+----------+--------------+ Ulnar         Full                                                 +----------+------------+---------+-----------+----------+--------------+ Cephalic    Partial                                                +----------+------------+---------+-----------+----------+--------------+ Basilic                                             Not visualized +----------+------------+---------+-----------+----------+--------------+  Summary:  Right: No evidence of deep vein thrombosis in the upper extremity. No evidence of superficial vein thrombosis in the upper extremity.  Left: No evidence of deep vein thrombosis in the upper extremity. No evidence of superficial vein thrombosis in the upper extremity.  *See table(s) above for measurements and observations.    Preliminary         Scheduled Meds: . allopurinol  100 mg Oral Once per day on Mon Wed Fri  . amiodarone  200 mg Oral Daily  . vitamin C  500 mg Oral Daily  . bictegravir-emtricitabine-tenofovir  AF  1 tablet Oral Daily  . brimonidine  1 drop Left Eye BID  . calcitRIOL  0.5 mcg Oral Q T,Th,Sa-HD  . Chlorhexidine Gluconate Cloth  6 each Topical Q0600  . Chlorhexidine Gluconate Cloth  6 each Topical Q0600  . [START ON 12/17/2019] Chlorhexidine Gluconate Cloth  6 each Topical Q0600  . dorzolamide-timolol  1 drop Left Eye BID  . famotidine  10 mg Oral Daily  . feeding supplement (NEPRO CARB STEADY)  237 mL Oral TID BM  . feeding supplement (PRO-STAT SUGAR FREE 64)  30 mL Oral BID  . insulin aspart  0-9 Units Subcutaneous Q4H  . insulin detemir  0.075 Units/kg Subcutaneous BID  . latanoprost  1 drop Left Eye QHS  . midodrine  10  mg Oral 3 times per day on Tue Thu Sat  . midodrine  10 mg Oral 3 times per day on Mon  . montelukast  10 mg Oral QHS  . multivitamin  1 tablet Oral Q1400  . sodium zirconium cyclosilicate  10 g Oral TID  . sucroferric oxyhydroxide  500 mg Oral TID WC  . Warfarin - Pharmacist Dosing Inpatient   Does not apply q1800  . zinc sulfate  220 mg Oral Daily   Continuous Infusions: . sodium chloride    . sodium chloride       LOS: 5 days    Time spent: 23 minutes with over 50% of the time coordinating the patient's care    [name], DO [facility] Pager [phone]  If 7PM-7AM, please contact night-coverage [email or web address] Password [email or web address] 12/16/2019, 3:00 PM

## 2019-12-16 NOTE — Progress Notes (Signed)
Bilateral upper extremity venous duplex complete.  Please see CV Proc tab for preliminary results. Lita Mains- RDMS, RVT 2:42 PM  12/16/2019

## 2019-12-17 ENCOUNTER — Inpatient Hospital Stay (HOSPITAL_COMMUNITY): Payer: Medicare HMO

## 2019-12-17 DIAGNOSIS — M7989 Other specified soft tissue disorders: Secondary | ICD-10-CM

## 2019-12-17 LAB — BASIC METABOLIC PANEL
Anion gap: 22 — ABNORMAL HIGH (ref 5–15)
BUN: 108 mg/dL — ABNORMAL HIGH (ref 8–23)
CO2: 23 mmol/L (ref 22–32)
Calcium: 8.6 mg/dL — ABNORMAL LOW (ref 8.9–10.3)
Chloride: 94 mmol/L — ABNORMAL LOW (ref 98–111)
Creatinine, Ser: 10.98 mg/dL — ABNORMAL HIGH (ref 0.61–1.24)
GFR calc Af Amer: 5 mL/min — ABNORMAL LOW (ref >60)
GFR calc non Af Amer: 4 mL/min — ABNORMAL LOW (ref >60)
Glucose, Bld: 69 mg/dL — ABNORMAL LOW (ref 70–99)
Potassium: 4.5 mmol/L (ref 3.5–5.1)
Sodium: 139 mmol/L (ref 135–145)

## 2019-12-17 LAB — SEDIMENTATION RATE: Sed Rate: 75 mm/hr — ABNORMAL HIGH (ref 0–16)

## 2019-12-17 LAB — CBC WITH DIFFERENTIAL/PLATELET
Abs Immature Granulocytes: 0.06 10*3/uL (ref 0.00–0.07)
Basophils Absolute: 0 10*3/uL (ref 0.0–0.1)
Basophils Relative: 0 %
Eosinophils Absolute: 0 10*3/uL (ref 0.0–0.5)
Eosinophils Relative: 0 %
HCT: 34.5 % — ABNORMAL LOW (ref 39.0–52.0)
Hemoglobin: 11.5 g/dL — ABNORMAL LOW (ref 13.0–17.0)
Immature Granulocytes: 1 %
Lymphocytes Relative: 11 %
Lymphs Abs: 0.9 10*3/uL (ref 0.7–4.0)
MCH: 35 pg — ABNORMAL HIGH (ref 26.0–34.0)
MCHC: 33.3 g/dL (ref 30.0–36.0)
MCV: 104.9 fL — ABNORMAL HIGH (ref 80.0–100.0)
Monocytes Absolute: 0.8 10*3/uL (ref 0.1–1.0)
Monocytes Relative: 10 %
Neutro Abs: 6.4 10*3/uL (ref 1.7–7.7)
Neutrophils Relative %: 78 %
Platelets: 346 10*3/uL (ref 150–400)
RBC: 3.29 MIL/uL — ABNORMAL LOW (ref 4.22–5.81)
RDW: 14.2 % (ref 11.5–15.5)
WBC: 8.3 10*3/uL (ref 4.0–10.5)
nRBC: 0.4 % — ABNORMAL HIGH (ref 0.0–0.2)

## 2019-12-17 LAB — D-DIMER, QUANTITATIVE: D-Dimer, Quant: 6.08 ug/mL-FEU — ABNORMAL HIGH (ref 0.00–0.50)

## 2019-12-17 LAB — GLUCOSE, CAPILLARY
Glucose-Capillary: 131 mg/dL — ABNORMAL HIGH (ref 70–99)
Glucose-Capillary: 180 mg/dL — ABNORMAL HIGH (ref 70–99)
Glucose-Capillary: 321 mg/dL — ABNORMAL HIGH (ref 70–99)
Glucose-Capillary: 70 mg/dL (ref 70–99)
Glucose-Capillary: 80 mg/dL (ref 70–99)
Glucose-Capillary: 91 mg/dL (ref 70–99)

## 2019-12-17 LAB — PHOSPHORUS: Phosphorus: 9.8 mg/dL — ABNORMAL HIGH (ref 2.5–4.6)

## 2019-12-17 LAB — FERRITIN: Ferritin: 1376 ng/mL — ABNORMAL HIGH (ref 24–336)

## 2019-12-17 LAB — PROTIME-INR
INR: 3.5 — ABNORMAL HIGH (ref 0.8–1.2)
Prothrombin Time: 35.4 seconds — ABNORMAL HIGH (ref 11.4–15.2)

## 2019-12-17 LAB — C-REACTIVE PROTEIN: CRP: 11 mg/dL — ABNORMAL HIGH (ref <1.0)

## 2019-12-17 MED ORDER — ALBUTEROL SULFATE HFA 108 (90 BASE) MCG/ACT IN AERS
1.0000 | INHALATION_SPRAY | RESPIRATORY_TRACT | Status: DC | PRN
  Administered 2019-12-18: 2 via RESPIRATORY_TRACT
  Filled 2019-12-17: qty 6.7

## 2019-12-17 MED ORDER — IPRATROPIUM-ALBUTEROL 20-100 MCG/ACT IN AERS
1.0000 | INHALATION_SPRAY | Freq: Four times a day (QID) | RESPIRATORY_TRACT | Status: DC
  Administered 2019-12-18 – 2019-12-21 (×13): 1 via RESPIRATORY_TRACT
  Filled 2019-12-17 (×2): qty 4

## 2019-12-17 MED ORDER — RENA-VITE PO TABS
1.0000 | ORAL_TABLET | Freq: Every day | ORAL | Status: DC
  Administered 2019-12-17 – 2020-01-08 (×23): 1 via ORAL
  Filled 2019-12-17 (×23): qty 1

## 2019-12-17 MED ORDER — BICTEGRAVIR-EMTRICITAB-TENOFOV 50-200-25 MG PO TABS
1.0000 | ORAL_TABLET | Freq: Every day | ORAL | Status: DC
  Administered 2019-12-18 – 2020-01-08 (×22): 1 via ORAL
  Filled 2019-12-17 (×25): qty 1

## 2019-12-17 MED ORDER — MIDODRINE HCL 5 MG PO TABS
10.0000 mg | ORAL_TABLET | ORAL | Status: DC
  Administered 2019-12-19 – 2020-01-05 (×7): 10 mg via ORAL
  Filled 2019-12-17 (×8): qty 2

## 2019-12-17 NOTE — TOC Progression Note (Signed)
Transition of Care [facility]) - Progression Note    Patient Details  Name: David [name] MRN: [id number] Date of Birth: October 15, 1941  Transition of [facility]) CM/SW Onset, LCSW Phone Number: 12/17/2019, 9:59 AM  Clinical Narrative:    Barrier to placement is still Dialysis transportation. CSW continuing to search for facility.    Expected Discharge Plan: [place] Barriers to Discharge: Continued Medical Work up  Expected Discharge Plan and Services Expected Discharge Plan: [place]: [place] arrangements for the past 2 months: Single Family Home                                       Social Determinants of Health (SDOH) Interventions    Readmission Risk Interventions Readmission Risk Prevention Plan 09/02/2019  Transportation Screening Complete  Medication Review (RN Care Manager) Complete  Palliative Care Screening Not Applicable  Some recent data might be hidden

## 2019-12-17 NOTE — Progress Notes (Signed)
[facility] Progress Note  Name: David [name] MRN: [id number] DOB: 1941-01-27  Chief Complaint:  weakness  Subjective:  pateitn seen in room, no resp c/o's, no sob or CP, no abd pain. He appears frail and stiff.    Background on consult:  David [name] is a 79 y.o. male with a history of ESRD on hemodialysis Monday Tuesday Thursday Saturday at [facility] who presented to the hospital with weakness, chills, and feeling unwell several days. He was found to be Covid positive.  He was seen by cardiology on 1/6 and felt to have severe end-stage heart failure with minimal options for treatment due to hypotension despite his midodrine.    Vitals:  Vitals:   12/17/19 1105 12/17/19 1130 12/17/19 1152 12/17/19 1448  BP: (!) 83/45 (!) 75/45 (!) 99/48 118/79  Pulse: 65 72  85  Resp: 12 20  20   Temp:   98 F (36.7 C) 99.4 F (37.4 C)  TempSrc:   Axillary Oral  SpO2:   96% 95%  Weight:   77.5 kg   Height:         Physical Exam:  General:  Adult male, appears quite weak and stiff and unable to move himself around in the bed at all Heart: S1-S2 no rub appreciated Lungs: CTA bilat, on RA Abdomen: Soft nontender nondistended Extremities: No pitting edema  Neuro: Alert and oriented x3  Access-right IJ tunneled catheter/ Right femoral graft with bruit and thrill   Outpatient HD:  [facility]  (MWF here)  4h  400/1.5  83.5kg  2/2 bath P4  AVG thigh  Hep 2000 - Mircera 85mcg IV q 2 weeks (last 12/06/19) - Calcitriol 0.79mcg PO q HD   Assessment/Plan:  # End-stage renal disease - outpt HD is MTTS schedule, will do HD 3x/week here,  MWF at this time. HD today. Will have to ^ Rx times given his debility and ^B/Cr, shoot for 3.5 hrs next on Friday - pt doesn't appear to be doing very well, is quite stiff and debilitated and not able to feed himself  # COVID-19 infection - Therapies per primary team - dexamethasone and remdesivir  # Combined chronic  systolic and diastolic chronic end-stage heart failure - optimize volume with HD as able - Status post cardiology evaluation with little therapy to add given his hypotension - is 3kg under dry wt, no vol ^ on exam  # DNR - appreciate pall care assistance  # Chronic hypotension - on midodrine  # Anemia secondary to CKD - On ESA-last dosed on 1/2 as above  # Secondary hyperparathyroidism - Will continue calcitriol inpatient and will be administered at his dialysis unit upon discharge.  renal diet. On velphoro  # H/o CVA   # HIV - on meds  # Atrial fibrillation - appears chronic  # DNR   Sol Blazing, MD 12/17/2019 3:38 PM

## 2019-12-17 NOTE — Progress Notes (Signed)
Bilateral lower extremity venous duplex completed. Refer to "CV Proc" under chart review to view preliminary results.  12/17/2019 2:57 PM Kelby Aline., MHA, RVT, RDCS, RDMS

## 2019-12-17 NOTE — Progress Notes (Signed)
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  Found to be Covid +.   OT comments  Pt with slow progress towards OT goals, continues to present with significant weakness, fatigue and impaired cognition. Pt with delayed processing and perseverating this session (asking about the name of a medication, RN made aware and to follow up). Pt tolerating bed mobility and sit<>stand at [facility] during session; he continues to require heavy two person assist for all aspects of mobility and ADL completion. VSS throughout on RA. Feel POC remains appropriate at this time. Will continue to follow acutely.   Follow Up Recommendations  SNF    Equipment Recommendations  None recommended by OT          Precautions / Restrictions Precautions Precautions: Fall Restrictions Weight Bearing Restrictions: No       Mobility Bed Mobility Overal bed mobility: Needs Assistance Bed Mobility: Supine to Sit;Sit to Supine     Supine to sit: Max assist;+2 for physical assistance Sit to supine: Max assist;+2 for physical assistance   General bed mobility comments: heavy assist for LEs over EOB and trunk elevation, max multimodal cues for technique/for pt to self-assist  Transfers Overall transfer level: Needs assistance   Transfers: Sit to/from Stand;Stand Pivot Transfers Sit to Stand: Mod assist;From elevated surface;+2 physical assistance;+2 safety/equipment;Max assist         General transfer comment:  maxA for power up to [facility] from bed surface, modAx2 for sit>stand from elevated Stedy pads,     Balance Overall balance assessment: Needs assistance Sitting-balance support: Feet supported Sitting balance-Leahy Scale: Zero Sitting balance - Comments: continues to require overall maxA for sitting balance  Postural control: Posterior lean;Right lateral lean Standing balance support: Bilateral upper extremity supported Standing balance-Leahy Scale: Zero Standing balance comment: pt requires outside support as well as B UE support on Stedy to maintain balance in standing                           ADL either performed or assessed with clinical judgement   ADL Overall ADL's : Needs assistance/impaired             Lower Body Bathing: Total assistance;Sit to/from stand               Toileting- Water quality scientist and Hygiene: Total assistance;Sit to/from stand;+2 for physical assistance       Functional mobility during ADLs: Moderate assistance;Maximal assistance;+2 for physical assistance;+2 for safety/equipment General ADL Comments: pt continues to present with weakness, poor sitting/standing balance and cognitive impairments; tolerated sitting EOB and sit<>Stand trials at [facility] while NT changed bed linens                       Cognition Arousal/Alertness: Awake/alert Behavior During Therapy: Flat affect Overall Cognitive Status: Impaired/Different from baseline Area of Impairment: Attention;Following commands;Safety/judgement;Awareness                   Current Attention Level: Sustained   Following Commands:  Follows one step commands with increased time   Awareness: Intellectual   General Comments: continues to have delayed processing, perseverating on figuring out the name of a specific medication throughout session, flat affect overall and requires encouragement to engage with thearapists        Exercises     Shoulder Instructions        General Comments VSS on RA throughout    Pertinent Vitals/ Pain       Pain Assessment: Faces Faces Pain Scale: Hurts a little bit Pain Location: generalized  Pain Descriptors / Indicators: Grimacing Pain Intervention(s): Limited activity within patient's tolerance;Monitored during session;Repositioned  Home Living                                          Prior Functioning/Environment              Frequency  Min 2X/week        Progress Toward Goals  OT Goals(current goals can now be found in the care plan section)  Progress towards OT goals: Progressing toward goals  Acute Rehab OT Goals Patient Stated Goal: to feel better  OT Goal Formulation: With patient Time For Goal Achievement: 12/27/19 Potential to Achieve Goals: Good ADL Goals Pt Will Perform Grooming: with min assist;sitting Pt Will Perform Upper Body Bathing: with min assist;sitting Pt Will Perform Lower Body Bathing: with mod assist;sit to/from stand Pt Will Transfer to Toilet: with mod assist;stand pivot transfer;bedside commode Pt Will Perform Toileting - Clothing Manipulation and hygiene: with mod assist;sit to/from stand  Plan Discharge plan remains appropriate    Co-evaluation    PT/OT/SLP Co-Evaluation/Treatment: Yes Reason for Co-Treatment: Complexity of the patient's impairments (multi-system involvement);For patient/therapist safety;To address functional/ADL transfers PT goals addressed during session: Mobility/safety with mobility OT goals addressed during session: ADL's and self-care      AM-PAC OT "6 Clicks" Daily Activity     Outcome Measure   Help from another person eating meals?: A Lot Help from another person taking care of personal grooming?: A Lot Help from another person toileting, which includes using toliet, bedpan, or urinal?: Total Help from another person bathing (including washing, rinsing, drying)?: A Lot Help from another person to put on and  taking off regular upper body clothing?: Total Help from another person to put on and taking off regular lower body clothing?: Total 6 Click Score: 9    End of Session Equipment Utilized During Treatment: Other (comment)(stedy)  OT Visit Diagnosis: Unsteadiness on feet (R26.81);Cognitive communication deficit (R41.841);Muscle weakness (generalized) (M62.81)   Activity Tolerance Patient tolerated treatment well;Patient limited by fatigue   Patient Left in bed;with call bell/phone within reach;with bed alarm set(bed in chair position)   Nurse Communication Mobility status(left bed in chair position, asking about meds)        Time: 1856-3149 OT Time Calculation (min): 39 min  Charges: OT General Charges $OT Visit: 1 Visit OT Treatments $Self Care/Home Management : 23-37 mins  Lou Cal, [place] Pager [phone] Office [place] 12/17/2019, 5:07 PM

## 2019-12-17 NOTE — Progress Notes (Signed)
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  Found to be Covid +.    PT Comments    Pt just received LE Ultrasound and found to be clear of DVTs. Pt bed saturated in urine and NT agreeable to change bed linens while pt stood. Pt is very concerned about the medications he had earlier and is difficult to redirect to task at hand, despite being told that as soon as he was cleaned up therapy would ask the nurse to come by and review medication list. Pt is maxAx2 for bed mobility, and sit>stand transfers to [place]. Pt was able to stand with assist in [facility] for 5 minutes for bathing of his back side. Once clean pt requires maxAx2 for return to bed. D/c plans remain appropriate at this time. PT will continue to follow acutely.   Follow Up Recommendations  SNF;Supervision/Assistance - 24 hour     Equipment Recommendations  Other (comment)(TBD)       Precautions / Restrictions Precautions Precautions: Fall Restrictions Weight Bearing Restrictions: No    Mobility  Bed Mobility Overal bed mobility: Needs Assistance Bed Mobility: Supine to Sit;Sit to Supine     Supine to sit: Max assist;+2 for physical assistance Sit to supine: Max assist;+2 for physical assistance   General bed mobility comments: heavy assist for LEs over EOB and trunk elevation, max multimodal cues for technique/for pt to self-assist  Transfers Overall transfer level: Needs assistance   Transfers: Sit to/from Stand;Stand  Pivot Transfers Sit to Stand: Mod assist;From elevated surface;+2 physical assistance;+2 safety/equipment;Max assist         General transfer comment: maxA for power up to [facility] from bed surface, modAx2 for sit>stand from elevated Stedy pads,         Balance Overall balance assessment: Needs assistance Sitting-balance support: Feet supported Sitting balance-Leahy Scale: Zero Sitting balance - Comments: continues to require overall maxA for sitting balance  Postural control: Posterior lean;Right lateral lean Standing balance support: Bilateral upper extremity supported Standing balance-Leahy Scale: Zero Standing balance comment: pt requires outside support as well as B UE support on Stedy to maintain balance in standing                            Cognition Arousal/Alertness: Awake/alert Behavior During Therapy: Flat affect Overall Cognitive Status: Impaired/Different from baseline Area of Impairment: Attention;Following commands;Safety/judgement;Awareness                   Current Attention Level: Sustained   Following Commands: Follows one step commands with increased time   Awareness: Intellectual   General Comments: continues to have delayed processing, perseverating on figuring out the name of a specific medication throughout session, flat affect overall and requires encouragement to engage with thearapists         General Comments General comments (skin integrity, edema, etc.): VSS on RA throughout      Pertinent Vitals/Pain Pain Assessment: Faces Faces Pain Scale: Hurts a little bit Pain Location: generalized  Pain Descriptors /  Indicators: Grimacing Pain Intervention(s): Limited activity within patient's tolerance;Monitored during session;Repositioned           PT Goals (current goals can now be found in the care plan section) Acute Rehab PT Goals Patient Stated Goal: to feel better  PT Goal Formulation: With patient Time For Goal  Achievement: 12/26/19 Potential to Achieve Goals: Good Progress towards PT goals: Progressing toward goals    Frequency    Min 3X/week      PT Plan Current plan remains appropriate    Co-evaluation PT/OT/SLP Co-Evaluation/Treatment: Yes Reason for Co-Treatment: Complexity of the patient's impairments (multi-system involvement);For patient/therapist safety;To address functional/ADL transfers PT goals addressed during session: Mobility/safety with mobility OT goals addressed during session: ADL's and self-care      AM-PAC PT "6 Clicks" Mobility   Outcome Measure  Help needed turning from your back to your side while in a flat bed without using bedrails?: A Lot Help needed moving from lying on your back to sitting on the side of a flat bed without using bedrails?: Total Help needed moving to and from a bed to a chair (including a wheelchair)?: Total Help needed standing up from a chair using your arms (e.g., wheelchair or bedside chair)?: Total Help needed to walk in hospital room?: Total Help needed climbing 3-5 steps with a railing? : Total 6 Click Score: 7    End of Session Equipment Utilized During Treatment: Gait belt Activity Tolerance: Patient limited by fatigue;Patient limited by lethargy Patient left: in bed;with call bell/phone within reach;with bed alarm set(bed placed in chair position ) Nurse Communication: Mobility status PT Visit Diagnosis: Other abnormalities of gait and mobility (R26.89);Muscle weakness (generalized) (M62.81);Other symptoms and signs involving the nervous system (H65.790)     Time: 3833-3832 PT Time Calculation (min) (ACUTE ONLY): 39 min  Charges:  $Therapeutic Activity: 8-22 mins                     Keaston Pile B. Migdalia Dk PT, DPT Acute Rehabilitation Services Pager [phone] Office [phone]    [place] 12/17/2019, 5:04 PM

## 2019-12-17 NOTE — Progress Notes (Signed)
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    Initially not started on any Covid treatment as he was hemodynamically stable on room air however had increasing respiratory rate and CRP 22.9 on 1/8 and so was started on dexamethasone and remdesivir.  Actemra on hold due to HIV status.    Cardiology consulted for elevated troponin but not deemed a candidate for cardiac intervention, likely demand ischemia and no therapies recommended.  Nephrology was consulted due to ESRD and palliative care team consulted and patient is now a DNR. He underwent Dialysis today and has had low BP's.    Assessment & Plan:   Active Problems:   COVID-19   Combined systolic and diastolic heart failure ([place])   ESRD on dialysis ([place])   DNR (do not resuscitate)  SIRS/Failure to thrive, secondary to COVID-19, SIRS resolved -On admission: Febrile, lactic acidosis. increased respiratory rate to >30, vitals. Now on room air   -CRP peaked at 24, currently downtrending, did not get actemra due to HIV status -Completed 4 days dexamethasone, stopped 1/11 due to uremia (BUN 99) and tolerating room air but may need to resume  -Day 5/5 Remdesivir and now Completed -Inflammatory Marker Trend - Recent Labs    12/15/19 0500 12/16/19 0500 12/17/19 1233  DDIMER 4.97* 9.96* 6.08*  FERRITIN 1,725* 1,098* 1,376*  CRP 11.9* 10.4* 11.0*  -ESR was 75  Lab Results  Component Value  Date   SARSCOV2NAA NEGATIVE 11/29/2019   [place] NEGATIVE 11/17/2019   [place] NOT DETECTED 11/06/2019   [employer] NEGATIVE 11/01/2019  -SpO2: 95 % -Palliative care consulted -patient made DNR on 1/11 after palliative discussion with wife -Tylenol for fever -C/w Antitussives with Guaifenesin-Dextromethorphan 10 mL po q4hprn Cough and Chlorpheniramine-Hydrocodone 5 mL po q12hprn -C/w Zinc 220 mg po Daily and Vitamin C  -Airborne and Contact Precautions -C/w Combivent Scheduled 1 puff IH q6h and Albuterol 1-2 puff IH q4hprn -Add Flutter Valve and Incentive Spirometry  -Continue to Monitor and Trend and check CXR in AM   Nausea/Vomiting, possible uremia versus unknown medication induced, resolved -Continue Zofran -Added pepcid  Elevated D-dimer -Initially 7.4, down trended to 4.9, increased to 9.9 yesterday and is now 6.08 despite therapeutic Coumadin levels -Tolerating room air; Repeat CXR in AM  -will check bilateral upper and lower extremity Dopplers  Elevated troponin with extensive cardiac history(history of PEA arrest, combined systolic/diastolicend stage heart failure), possibly demand ischemia in ESRD patient, stable -Troponin255->271 on admission -Asymptomatic with baseline LBBB on EKG -Cardiology consulted-no indication for cardiac intervention -Palliative care consulted, as above -Telemetry -Poor prognosis, see Almyra Deforest, PA note from 12/10/19 for complete details  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 Eden Lathe on 11/10/2019, unfortunately this thrombosed and underwent thrombectomy by Dr. Carlis Abbott on 12/14. Eventually underwent a right SFA to common femoral vein AV loop graft by Dr. 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 every 6 as needed severe pain  History of HIV  -[name]  PreDiabetes -CBG's ranging 70-321 -C/w Insulin Aspart 0-9 Sensitive Scale q4h  Ambulatory Dysfunction -Uses cane and walker at baseline -PT/OT recommending SNF at discharge  Glaucoma -Continue with brimonidine 0.15% ophthalmic solution 1 drop left eye twice daily, as well as dorzolamide-timolol 22.3-six-point milligrams per milliliter 1 drop in the left eye twice daily, and latanoprost 1 drop in the left eye nightly  DVT prophylaxis: Anticoagulated with Coumadin Code Status: DO NOT RESUSCITATE  Family Communication: No family present at bedside  Disposition Plan: Pending Clinical Improvement and D/C to SNF   Consultants:   Cardiology  Nephrology   Palliative Care   Procedures: VENOUS DUPLEX Upper and Lower  Upper showed No DVT and Lower showed No DVT   Antimicrobials: Anti-infectives (From admission, onward)   Start     Dose/Rate Route Frequency Ordered Stop   12/18/19 2000  bictegravir-emtricitabine-tenofovir AF ([name]) 50-200-25 MG per tablet 1 tablet     1 tablet Oral Daily 12/17/19 1514     12/13/19 1000  remdesivir 100 mg in sodium chloride 0.9 % 100 mL IVPB     100 mg 200 mL/hr over 30 Minutes Intravenous Every 24 hours 12/12/19 0724 12/16/19 1152   12/12/19 1000  bictegravir-emtricitabine-tenofovir AF ([name]) 50-200-25 MG per tablet 1 tablet  Status:  Discontinued     1 tablet Oral Daily 12/11/19 2024 12/17/19 1514   12/12/19 0800  remdesivir 200 mg in  sodium chloride 0.9% 250 mL IVPB     200 mg 580 mL/hr over 30 Minutes Intravenous Once 12/12/19 0724 12/12/19 1253     Subjective: Seen and examined at bedside and he was getting dialysis and felt fatigued.  No chest pain, lightheadedness or dizziness.  Nursing states that his blood pressures have been low as long as well as his blood glucose.  No other concerns or complaints at this time  Objective: Vitals:   12/17/19 1105 12/17/19 1130 12/17/19 1152 12/17/19 1448  BP: (!) 83/45 (!) 75/45 (!) 99/48 118/79  Pulse: 65 72  85  Resp: '12 20  20  '$ Temp:   98 F (36.7 C) 99.4 F (37.4 C)  TempSrc:   Axillary Oral  SpO2:   96% 95%  Weight:   77.5 kg   Height:        Intake/Output Summary (Last 24 hours) at 12/17/2019 1937 Last data filed at 12/17/2019 1900 Gross per 24 hour  Intake 360 ml  Output 1000 ml  Net -640 ml   Filed Weights   12/17/19 0407 12/17/19 0922 12/17/19 1152  Weight: 78.5 kg 78.5 kg 77.5 kg   Examination: Physical Exam:  Constitutional: WN/WD older appearing than stated age African-American male in NAD and appears calm but fatigued Eyes: Lids and conjunctivae normal, sclerae anicteric  ENMT: External Ears, Nose appear normal. Grossly normal hearing.   Neck: Appears normal, supple, no cervical masses,  normal ROM, no appreciable thyromegaly; no JVD Respiratory: Diminished to auscultation bilaterally, no wheezing, rales, rhonchi or crackles. Normal respiratory effort and patient is not tachypenic. No accessory muscle use.  Cardiovascular: RRR, no murmurs / rubs / gallops. S1 and S2 auscultated. Trace extremity edema.  Abdomen: Soft, non-tender, Distended. No masses palpated. No appreciable hepatosplenomegaly. Bowel sounds positive x4.  GU: Deferred. Musculoskeletal: No clubbing / cyanosis of digits/nails. No joint deformity upper and lower extremities. Skin: No rashes, lesions, ulcers on a limited skin evaluation. No induration; Warm and dry.  Neurologic: CN 2-12  grossly intact with no focal deficits. Romberg sign and cerebellar reflexes not assessed.  Psychiatric: Normal judgment and insight. Alert and oriented x 3. Normal mood and appropriate affect.   Data Reviewed: I have personally reviewed following labs and imaging studies  CBC: Recent Labs  Lab 12/13/19 0423 12/14/19 0400 12/15/19 0500 12/16/19 0500 12/17/19 1233  WBC 5.4 6.6 7.0 6.8 8.3  NEUTROABS 3.5 4.7 5.3 5.1 6.4  HGB 10.1* 9.7* 9.8* 10.2* 11.5*  HCT 30.4* 29.3* 28.8* 30.8* 34.5*  MCV 107.0* 105.4* 104.3* 105.8* 104.9*  PLT 223 247 289 283 989   Basic Metabolic Panel: Recent Labs  Lab 12/12/19 0449 12/13/19 0423 12/14/19 0400 12/15/19 0500 12/16/19 0500 12/17/19 0500  NA  --  135 136 136 138 139  K  --  5.7* 5.0 5.2* 5.0 4.5  CL  --  91* 96* 94* 95* 94*  CO2  --  20* 24 21* 25 23  GLUCOSE  --  111* 97 94 91 69*  BUN  --  72* 59* 99* 72* 108*  CREATININE  --  10.48* 8.44* 11.24* 8.63* 10.98*  CALCIUM  --  8.5* 8.3* 8.4* 8.9 8.6*  MG 2.2 2.3 2.2 2.5* 2.2  --   PHOS 6.4* 8.1* 7.0*  6.9* 9.1* 7.3* 9.8*   GFR: Estimated Creatinine Clearance: 6.2 mL/min (A) (by C-G formula based on SCr of 10.98 mg/dL (H)). Liver Function Tests: Recent Labs  Lab 12/11/19 1446 12/12/19 2221 12/14/19 0400  AST 68*  --   --   ALT 23  --   --   ALKPHOS 98  --   --   BILITOT 0.8  --   --   PROT 7.1  --   --   ALBUMIN 3.0* 2.7* 2.5*   No results for input(s): LIPASE, AMYLASE in the last 168 hours. No results for input(s): AMMONIA in the last 168 hours. Coagulation Profile: Recent Labs  Lab 12/13/19 0423 12/14/19 0400 12/15/19 0500 12/16/19 0500 12/17/19 0500  INR 1.8* 2.3* 2.9* 3.7* 3.5*   Cardiac Enzymes: No results for input(s): CKTOTAL, CKMB, CKMBINDEX, TROPONINI in the last 168 hours. BNP (last 3 results) No results for input(s): PROBNP in the last 8760 hours. HbA1C: No results for input(s): HGBA1C in the last 72 hours. CBG: Recent Labs  Lab 12/17/19 0007  12/17/19 0406 12/17/19 0756 12/17/19 1258 12/17/19 1741  GLUCAP 91 70 180* 131* 321*   Lipid Profile: No results for input(s): CHOL, HDL, LDLCALC, TRIG, CHOLHDL, LDLDIRECT in the last 72 hours. Thyroid Function Tests: No results for input(s): TSH, T4TOTAL, FREET4, T3FREE, THYROIDAB in the last 72 hours. Anemia Panel: Recent Labs    12/16/19 0500 12/17/19 1233  FERRITIN 1,098* 1,376*   Sepsis Labs: Recent Labs  Lab 12/11/19 1230 12/11/19 1331 12/11/19 2024  PROCALCITON  --   --  5.49  LATICACIDVEN 2.3* 1.1  --     Recent Results (from the  past 240 hour(s))  Culture, blood (Routine X 2) w Reflex to ID Panel     Status: None   Collection Time: 12/11/19  9:00 PM   Specimen: BLOOD RIGHT HAND  Result Value Ref Range Status   Specimen Description BLOOD RIGHT HAND  Final   Special Requests   Final    BOTTLES DRAWN AEROBIC ONLY Blood Culture results may not be optimal due to an inadequate volume of blood received in culture bottles   Culture   Final    NO GROWTH 5 DAYS Performed at [facility], [place] [address], [place], [place] [address]    Report Status 12/16/2019 FINAL  Final  MRSA PCR Screening     Status: None   Collection Time: 12/12/19 10:35 PM   Specimen: Nasal Mucosa; Nasopharyngeal  Result Value Ref Range Status   MRSA by PCR NEGATIVE NEGATIVE Final    Comment:        The GeneXpert MRSA Assay (FDA approved for NASAL specimens only), is one component of a comprehensive MRSA colonization surveillance program. It is not intended to diagnose MRSA infection nor to guide or monitor treatment for MRSA infections. Performed at [facility], [place] [address], [place], [place] [address]      RN Pressure Injury Documentation:     Estimated body mass index is 21.94 kg/m as calculated from the following:   Height as of this encounter: '6\' 2"'$  (1.88 m).   Weight as of this encounter: 77.5 kg.  Malnutrition Type:  Nutrition Problem: Inadequate oral  intake Etiology: poor appetite, vomiting   Malnutrition Characteristics:  Signs/Symptoms: meal completion < 25%   Nutrition Interventions:  Interventions: Nepro shake, MVI, Prostat, Liberalize Diet   Radiology Studies: VAS [place] LOWER EXTREMITY VENOUS (DVT)  Result Date: 12/17/2019  Lower Venous Study Indications: Swelling, and Covid positive.  Limitations: Body habitus, poor ultrasound/tissue interface and restricted mobility. Performing Technologist: Maudry Mayhew MHA, RDMS, RVT, RDCS  Examination Guidelines: A complete evaluation includes B-mode imaging, spectral Doppler, color Doppler, and power Doppler as needed of all accessible portions of each vessel. Bilateral testing is considered an integral part of a complete examination. Limited examinations for reoccurring indications may be performed as noted.  +---------+---------------+---------+-----------+----------+--------------+ RIGHT    CompressibilityPhasicitySpontaneityPropertiesThrombus Aging +---------+---------------+---------+-----------+----------+--------------+ CFV      Full           Yes      Yes                                 +---------+---------------+---------+-----------+----------+--------------+ SFJ      Full                                                        +---------+---------------+---------+-----------+----------+--------------+ FV Prox  Full                                                        +---------+---------------+---------+-----------+----------+--------------+ FV Mid   Full                                                        +---------+---------------+---------+-----------+----------+--------------+  FV DistalFull                                                        +---------+---------------+---------+-----------+----------+--------------+ PFV      Full                                                         +---------+---------------+---------+-----------+----------+--------------+ POP      Full           Yes      Yes                                 +---------+---------------+---------+-----------+----------+--------------+ PTV      Full                                                        +---------+---------------+---------+-----------+----------+--------------+ PERO     Full                                                        +---------+---------------+---------+-----------+----------+--------------+   +---------+---------------+---------+-----------+----------+--------------+ LEFT     CompressibilityPhasicitySpontaneityPropertiesThrombus Aging +---------+---------------+---------+-----------+----------+--------------+ CFV      Full           Yes      Yes                                 +---------+---------------+---------+-----------+----------+--------------+ SFJ      Full                                                        +---------+---------------+---------+-----------+----------+--------------+ FV Prox  Full                                                        +---------+---------------+---------+-----------+----------+--------------+ FV Mid   Full                                                        +---------+---------------+---------+-----------+----------+--------------+ FV DistalFull                                                        +---------+---------------+---------+-----------+----------+--------------+  PFV      Full                                                        +---------+---------------+---------+-----------+----------+--------------+ POP      Full           Yes      Yes                                 +---------+---------------+---------+-----------+----------+--------------+ PTV      Full                                                         +---------+---------------+---------+-----------+----------+--------------+ PERO     Full                                                        +---------+---------------+---------+-----------+----------+--------------+     Summary: Right: There is no evidence of deep vein thrombosis in the lower extremity. No cystic structure found in the popliteal fossa. Left: There is no evidence of deep vein thrombosis in the lower extremity. No cystic structure found in the popliteal fossa.  *See table(s) above for measurements and observations.    Preliminary    VAS [place] UPPER EXTREMITY VENOUS DUPLEX  Result Date: 12/16/2019 UPPER VENOUS STUDY  Indications: Swelling Limitations: Previous surgeries and bandages. Performing Technologist: Antonieta Pert RDMS, RVT  Examination Guidelines: A complete evaluation includes B-mode imaging, spectral Doppler, color Doppler, and power Doppler as needed of all accessible portions of each vessel. Bilateral testing is considered an integral part of a complete examination. Limited examinations for reoccurring indications may be performed as noted.  Right Findings: +----------+------------+---------+-----------+----------+-------------------+ RIGHT     CompressiblePhasicitySpontaneousProperties      Summary       +----------+------------+---------+-----------+----------+-------------------+ IJV           Full       Yes       Yes                                  +----------+------------+---------+-----------+----------+-------------------+ Subclavian    Full                                  Not well visualized +----------+------------+---------+-----------+----------+-------------------+ Axillary      Full       Yes       Yes                                  +----------+------------+---------+-----------+----------+-------------------+ Brachial      Full       Yes       Yes                                   +----------+------------+---------+-----------+----------+-------------------+  Radial        Full                                                      +----------+------------+---------+-----------+----------+-------------------+ Ulnar         Full                                                      +----------+------------+---------+-----------+----------+-------------------+ Cephalic      Full                                     forearm only     +----------+------------+---------+-----------+----------+-------------------+ Basilic       Full       Yes       Yes                                  +----------+------------+---------+-----------+----------+-------------------+ Subclavian vein not well visualized secondary to port bandage.  Left Findings: +----------+------------+---------+-----------+----------+--------------+ LEFT      CompressiblePhasicitySpontaneousProperties   Summary     +----------+------------+---------+-----------+----------+--------------+ IJV           Full       Yes       Yes                             +----------+------------+---------+-----------+----------+--------------+ Subclavian    Full       Yes       Yes                             +----------+------------+---------+-----------+----------+--------------+ Axillary      Full       Yes       Yes                             +----------+------------+---------+-----------+----------+--------------+ Brachial      Full       Yes       Yes                             +----------+------------+---------+-----------+----------+--------------+ Radial        Full                                                 +----------+------------+---------+-----------+----------+--------------+ Ulnar         Full                                                 +----------+------------+---------+-----------+----------+--------------+ Cephalic    Partial                                                 +----------+------------+---------+-----------+----------+--------------+  Basilic                                             Not visualized +----------+------------+---------+-----------+----------+--------------+  Summary:  Right: No evidence of deep vein thrombosis in the upper extremity. No evidence of superficial vein thrombosis in the upper extremity.  Left: No evidence of deep vein thrombosis in the upper extremity. No evidence of superficial vein thrombosis in the upper extremity.  *See table(s) above for measurements and observations.  Diagnosing physician: [name] MD Electronically signed by [name] MD on 12/16/2019 at 4:10:16 PM.    Final    Scheduled Meds: . allopurinol  100 mg Oral Once per day on Mon Wed Fri  . amiodarone  200 mg Oral Daily  . vitamin C  500 mg Oral Daily  . [START ON 12/18/2019] bictegravir-emtricitabine-tenofovir AF  1 tablet Oral Daily  . brimonidine  1 drop Left Eye BID  . calcitRIOL  0.5 mcg Oral Q T,Th,Sa-HD  . Chlorhexidine Gluconate Cloth  6 each Topical Q0600  . dorzolamide-timolol  1 drop Left Eye BID  . famotidine  10 mg Oral Daily  . feeding supplement (NEPRO CARB STEADY)  237 mL Oral TID BM  . feeding supplement (PRO-STAT SUGAR FREE 64)  30 mL Oral BID  . insulin aspart  0-9 Units Subcutaneous Q4H  . latanoprost  1 drop Left Eye QHS  . [START ON 12/19/2019] midodrine  10 mg Oral Q M,W,F-HD  . montelukast  10 mg Oral QHS  . multivitamin  1 tablet Oral QHS  . sucroferric oxyhydroxide  500 mg Oral TID WC  . Warfarin - Pharmacist Dosing Inpatient   Does not apply q1800  . zinc sulfate  220 mg Oral Daily   Continuous Infusions: . sodium chloride    . sodium chloride      LOS: 6 days   Kerney Elbe, DO [facility] PAGER is on [place]  If 7PM-7AM, please contact night-coverage [email or web address]

## 2019-12-17 NOTE — Progress Notes (Signed)
ANTICOAGULATION CONSULT NOTE - Follow up  Pharmacy Consult for warfarin Indication: atrial fibrillation  Allergies  Allergen Reactions  . Dextromethorphan-Guaifenesin     UNKNOWN   . Tocotrienols     UNKNOWN   . Losartan Potassium Other (See Comments)    Causes constipation    Patient Measurements: Height: 6\' 2"  (188 cm) Weight: 173 lb 1 oz (78.5 kg) IBW/kg (Calculated) : 82.2  Vital Signs: Temp: 97.2 F (36.2 C) (01/13 0732) Temp Source: Oral (01/13 0407) BP: 116/63 (01/13 0732) Pulse Rate: 69 (01/13 0732)  Labs: Recent Labs    12/15/19 0500 12/16/19 0500 12/17/19 0500  HGB 9.8* 10.2*  --   HCT 28.8* 30.8*  --   PLT 289 283  --   LABPROT 30.4* 36.3* 35.4*  INR 2.9* 3.7* 3.5*  CREATININE 11.24* 8.63* 10.98*    Estimated Creatinine Clearance: 6.3 mL/min (A) (by C-G formula based on SCr of 10.98 mg/dL (H)).   Medical History: Past Medical History:  Diagnosis Date  . 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 Acute on chronic systolic and diastolic heart failure, NYHA class 4 ([place])   . Anemia, iron deficiency 11/15/2011  . Arthritis    "hands, right knee, feet" (02/21/2018)  . Atrial fibrillation ([place])   . Cancer [facility])    hx of prostate; s/p radioactive seed implant 10/2009 Dr Janice Norrie  . Cardiac arrest ([place]) 02/17/2018  . CHF (congestive heart failure) ([place])   . Chronic combined systolic and diastolic heart failure, NYHA class 3 ([place]) 06/2010   felt to be secondary to hypertensive cardiomyopathy  . Chronic lower back pain   . CKD (chronic kidney disease) stage V requiring chronic dialysis ([place])   . ESRD on dialysis [facility])    started 02/2018 [place]  . GERD (gastroesophageal reflux disease)   . Glaucoma    "I had surgery and dont have it any more"  . Gout    daily RX (02/21/2018)  . Heart murmur    "mild" per pt  . Hepatitis    years ago  . HIV (human immunodeficiency virus infection) ([place])   . HIV infection ([place])   .  Hyperlipidemia   . Hypertension    followed by [facility] (Dr Dani Gobble [place])  . Hypertension   . Nonischemic cardiomyopathy ([place])   . Pneumonia 11/2017  . Prostate cancer ([place])   . Sinus bradycardia   . Sleep apnea    does not use a cpap  . Stroke [facility])    "mini stroke" years ago  . Wears glasses    Assessment: 54 yom presented to the ED with generalized weakness. Found to be COVID positive. He is on chronic warfarin for history of afib. INR is supratherapeutic today at 3.5 and starting to trend down. CBC is stable. No bleeding noted today. Warfarin initially managed by MD now pharmacy will be managing.   Of note, pt had low INR on 1/6 and coumadin clinic instructed him to take 10mg  that day them resume previous dose.   PTA warfarin dose 2.5mg  on Mon + Fri and 5mg  all other days  Goal of Therapy:  INR 2-3 Monitor platelets by anticoagulation protocol: Yes   Plan:  Hold Warfarin tonight Daily INR, monitor for s/sx of bleeding   Sherren Kerns, PharmD PGY1 [place] Please utilize Amion for appropriate phone number to reach the unit pharmacist ([place])

## 2019-12-18 ENCOUNTER — Inpatient Hospital Stay (HOSPITAL_COMMUNITY): Payer: Medicare HMO

## 2019-12-18 LAB — CBC WITH DIFFERENTIAL/PLATELET
Abs Immature Granulocytes: 0.03 10*3/uL (ref 0.00–0.07)
Basophils Absolute: 0 10*3/uL (ref 0.0–0.1)
Basophils Relative: 0 %
Eosinophils Absolute: 0.1 10*3/uL (ref 0.0–0.5)
Eosinophils Relative: 1 %
HCT: 32.2 % — ABNORMAL LOW (ref 39.0–52.0)
Hemoglobin: 10.6 g/dL — ABNORMAL LOW (ref 13.0–17.0)
Immature Granulocytes: 0 %
Lymphocytes Relative: 15 %
Lymphs Abs: 1.1 10*3/uL (ref 0.7–4.0)
MCH: 34.8 pg — ABNORMAL HIGH (ref 26.0–34.0)
MCHC: 32.9 g/dL (ref 30.0–36.0)
MCV: 105.6 fL — ABNORMAL HIGH (ref 80.0–100.0)
Monocytes Absolute: 1 10*3/uL (ref 0.1–1.0)
Monocytes Relative: 14 %
Neutro Abs: 5 10*3/uL (ref 1.7–7.7)
Neutrophils Relative %: 70 %
Platelets: 264 10*3/uL (ref 150–400)
RBC: 3.05 MIL/uL — ABNORMAL LOW (ref 4.22–5.81)
RDW: 14.2 % (ref 11.5–15.5)
WBC: 7.1 10*3/uL (ref 4.0–10.5)
nRBC: 0 % (ref 0.0–0.2)

## 2019-12-18 LAB — GLUCOSE, CAPILLARY
Glucose-Capillary: 107 mg/dL — ABNORMAL HIGH (ref 70–99)
Glucose-Capillary: 110 mg/dL — ABNORMAL HIGH (ref 70–99)
Glucose-Capillary: 120 mg/dL — ABNORMAL HIGH (ref 70–99)
Glucose-Capillary: 135 mg/dL — ABNORMAL HIGH (ref 70–99)
Glucose-Capillary: 137 mg/dL — ABNORMAL HIGH (ref 70–99)
Glucose-Capillary: 193 mg/dL — ABNORMAL HIGH (ref 70–99)

## 2019-12-18 LAB — COMPREHENSIVE METABOLIC PANEL
ALT: 24 U/L (ref 0–44)
AST: 30 U/L (ref 15–41)
Albumin: 2.5 g/dL — ABNORMAL LOW (ref 3.5–5.0)
Alkaline Phosphatase: 92 U/L (ref 38–126)
Anion gap: 18 — ABNORMAL HIGH (ref 5–15)
BUN: 84 mg/dL — ABNORMAL HIGH (ref 8–23)
CO2: 26 mmol/L (ref 22–32)
Calcium: 8.6 mg/dL — ABNORMAL LOW (ref 8.9–10.3)
Chloride: 94 mmol/L — ABNORMAL LOW (ref 98–111)
Creatinine, Ser: 9.78 mg/dL — ABNORMAL HIGH (ref 0.61–1.24)
GFR calc Af Amer: 5 mL/min — ABNORMAL LOW (ref >60)
GFR calc non Af Amer: 5 mL/min — ABNORMAL LOW (ref >60)
Glucose, Bld: 103 mg/dL — ABNORMAL HIGH (ref 70–99)
Potassium: 4.2 mmol/L (ref 3.5–5.1)
Sodium: 138 mmol/L (ref 135–145)
Total Bilirubin: 0.9 mg/dL (ref 0.3–1.2)
Total Protein: 6.5 g/dL (ref 6.5–8.1)

## 2019-12-18 LAB — FIBRINOGEN: Fibrinogen: 653 mg/dL — ABNORMAL HIGH (ref 210–475)

## 2019-12-18 LAB — MAGNESIUM: Magnesium: 2.2 mg/dL (ref 1.7–2.4)

## 2019-12-18 LAB — PROTIME-INR
INR: 3.6 — ABNORMAL HIGH (ref 0.8–1.2)
Prothrombin Time: 35.9 seconds — ABNORMAL HIGH (ref 11.4–15.2)

## 2019-12-18 LAB — C-REACTIVE PROTEIN: CRP: 16.3 mg/dL — ABNORMAL HIGH (ref <1.0)

## 2019-12-18 LAB — FERRITIN: Ferritin: 993 ng/mL — ABNORMAL HIGH (ref 24–336)

## 2019-12-18 LAB — SEDIMENTATION RATE: Sed Rate: 100 mm/hr — ABNORMAL HIGH (ref 0–16)

## 2019-12-18 LAB — D-DIMER, QUANTITATIVE: D-Dimer, Quant: 5.07 ug/mL-FEU — ABNORMAL HIGH (ref 0.00–0.50)

## 2019-12-18 LAB — PHOSPHORUS: Phosphorus: 8.5 mg/dL — ABNORMAL HIGH (ref 2.5–4.6)

## 2019-12-18 LAB — LACTATE DEHYDROGENASE: LDH: 248 U/L — ABNORMAL HIGH (ref 98–192)

## 2019-12-18 MED ORDER — CHLORHEXIDINE GLUCONATE CLOTH 2 % EX PADS
6.0000 | MEDICATED_PAD | Freq: Every day | CUTANEOUS | Status: DC
  Administered 2019-12-19 – 2019-12-21 (×3): 6 via TOPICAL

## 2019-12-18 MED ORDER — DEXAMETHASONE SODIUM PHOSPHATE 10 MG/ML IJ SOLN
6.0000 mg | INTRAMUSCULAR | Status: DC
  Administered 2019-12-18 – 2019-12-23 (×6): 6 mg via INTRAVENOUS
  Filled 2019-12-18 (×7): qty 1

## 2019-12-18 NOTE — Progress Notes (Signed)
[facility] Progress Note  Name: David [name] MRN: [id number] DOB: 30-Nov-1941  Chief Complaint:  weakness  Subjective:   Patient not examined today directly given COVID-19 + status, utilizing data taken from chart +/- discussions w/ providers and staff.     Background on consult:  Kazuto Sevey is a 79 y.o. male with a history of ESRD on hemodialysis Monday Tuesday Thursday Saturday at [facility] who presented to the hospital with weakness, chills, and feeling unwell several days. He was found to be Covid positive.  He was seen by cardiology on 1/6 and felt to have severe end-stage heart failure with minimal options for treatment due to hypotension despite his midodrine.    Vitals:  Vitals:   12/18/19 0030 12/18/19 0400 12/18/19 0425 12/18/19 0549  BP: 100/77 (!) 110/56 (!) 110/52   Pulse: 88 79 86   Resp: (!) 24 20 20    Temp: 98.3 F (36.8 C)  98.4 F (36.9 C)   TempSrc: Oral  Oral   SpO2: 96% 92% 95%   Weight:    78.5 kg  Height:         Physical Exam:   Patient not examined today directly given COVID-19 + status, utilizing data taken from chart +/- discussions w/ providers and staff.    OP HD:  [facility]  (MWF here)  4h  400/1.5  83.5kg  2/2 bath P4  AVG thigh  Hep 2000 - Mircera 67mcg IV q 2 weeks (last 12/06/19) - Calcitriol 0.33mcg PO q HD   Assessment/Plan:  # End-stage renal disease - outpt HD is [facility] schedule, on MWF here. Will have to ^ Rx times given his debility and ^B/Cr, plan for 3.5 hrs next HD tomorrow on Friday.  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  renal diet. On velphoro  # H/o CVA   # HIV - on meds  # Atrial fibrillation - appears chronic  # DNR   Sol Blazing, MD 12/18/2019 2:39 PM

## 2019-12-18 NOTE — Progress Notes (Signed)
Nutrition Follow-up  RD working remotely.  DOCUMENTATION CODES:   Not applicable  INTERVENTION:   If within [employer], recommend Cortrak tube placement and initiation of enteral nutrition given inadequate PO intake, recent significant weight loss, and increased kcal and protein needs. Cortrak team available Tuesday and Friday. Recommend: - 1 carton (237 ml) Nepro formula QID via Cortrak  Recommended tube feeding regimen would provide 1706 kcal, 77 grams of protein, and 689 ml of H2O (81% kcal needs, 73% protein needs).  - Continue renal MVI daily  - Continue Nepro Shake po TID, each supplement provides 425 kcal and 19 grams protein  - Continue Pro-stat 30 ml po BID, each supplement provides 100 kcal and 15 grams of protein  - Encourage adequate PO intake and provide feeding assistance as needed  NUTRITION DIAGNOSIS:   Inadequate oral intake related to poor appetite, vomiting as evidenced by meal completion < 25%.  Ongoing  GOAL:   Patient will meet greater than or equal to 90% of their needs  Unmet at this time  MONITOR:   PO intake, Supplement acceptance, Labs, Weight trends, I & O's  REASON FOR ASSESSMENT:   Malnutrition Screening Tool    ASSESSMENT:   79 year old male who presented to the ED on 1/07 with generalized weakness and tremors. PMH of ESRD on HD ([facility]), CHF, anemia, atrial fibrillation, PEA arrest in March 2019, GERD, HIV, prostate cancer, CVA, HTN. Pt tested positive for COVID-19.  Pt is now a DNR. Therapies recommending SNF.  Spoke with RN via phone call. RN reports pt is eating less than 50% at meals. RN reports pt consumed about 40% at breakfast meal and is still working on lunch. RN states that pt is refusing oral nutrition supplements.  Post-HD weight on 1/13 was 77.5 kg which is well below his EDW of 83.5 kg.  If within [place], recommend Cortrak tube and initiation of enteral nutrition given inadequate PO intake and increased kcal and protein  needs.  Messaged MD via secure chat regarding recommendation for Cortrak.  RD will leave tube feeding recommendations.  Meal Completion: 10-80% x last 7 recorded meals  Medications reviewed and include: vitamin C, calcitriol, Pepcid, Nepro TID, Pro-stat BID, SSI, rena-vit, Velphoro TID, warfarin, zinc sulfate  Labs reviewed: chloride 94, phosphorus 8.5 CBG's: 80-321 x 24 hours  HD net UF 1/13: 1000 ml  Diet Order:   Diet Order            Diet renal with fluid restriction Fluid restriction: 1200 mL Fluid; Room service appropriate? No; Fluid consistency: Thin  Diet effective now              EDUCATION NEEDS:   No education needs have been identified at this time  Skin:  Skin Assessment: Skin Integrity Issues: Incisions: right thigh  Last BM:  12/18/19  Height:   Ht Readings from Last 1 Encounters:  12/12/19 6\' 2"  (1.88 m)    Weight:   Wt Readings from Last 1 Encounters:  12/18/19 78.5 kg    Ideal Body Weight:  86.4 kg  BMI:  Body mass index is 22.22 kg/m.  Estimated Nutritional Needs:   Kcal:  2100-2300  Protein:  105-120 grams  Fluid:  1000 ml + UOP    Gaynell Face, MS, RD, LDN Inpatient Clinical Dietitian Pager: [phone] Weekend/After Hours: [phone]

## 2019-12-18 NOTE — Plan of Care (Signed)
  Problem: Education: Goal: Knowledge of General Education information will improve Description: Including pain rating scale, medication(s)/side effects and non-pharmacologic comfort measures Outcome: Not Progressing   PT CONFUSED

## 2019-12-18 NOTE — Progress Notes (Signed)
ANTICOAGULATION CONSULT NOTE - Follow up  Pharmacy Consult for warfarin Indication: atrial fibrillation  (CHADS2VASc = 8)   Patient Measurements: Height: 6\' 2"  (188 cm) Weight: 173 lb 1 oz (78.5 kg) IBW/kg (Calculated) : 82.2  Vital Signs: Temp: 98.4 F (36.9 C) (01/14 0425) Temp Source: Oral (01/14 0425) BP: 110/52 (01/14 0425) Pulse Rate: 86 (01/14 0425)  Labs: Recent Labs    12/16/19 0500 12/16/19 0500 12/17/19 0500 12/17/19 1233 12/18/19 0448  HGB 10.2*   < >  --  11.5* 10.6*  HCT 30.8*  --   --  34.5* 32.2*  PLT 283  --   --  346 264  LABPROT 36.3*  --  35.4*  --  35.9*  INR 3.7*  --  3.5*  --  3.6*  CREATININE 8.63*  --  10.98*  --  9.78*   < > = values in this interval not displayed.    Estimated Creatinine Clearance: 7 mL/min (A) (by C-G formula based on SCr of 9.78 mg/dL (H)).   Assessment: 79 yo M on warfarin for Afib (CHADS2VASc = 8) admitted for COVID and elevated D-Dimer > 5 on admission. Of note, pt had INR 1.2 on 1/6 and coumadin clinic instructed him to take 10mg  that day them resume previous dose.   Supratherapeutic INR after resuming home dose, likely due to poor po intake. No reported bleeding, H/H, plt stable. High clot risk, relatively low bleed risk. Will hold again today = 3rd day of holding dose.  PTA warfarin dose 2.5mg  on Mon + Fri and 5mg  all other days  Goal of Therapy:  INR 2-3 Monitor platelets by anticoagulation protocol: Yes   Plan:  Hold Warfarin tonight Daily INR, monitor for s/sx of bleeding  Benetta Spar, PharmD, BCPS, BCCP Clinical Pharmacist  Please check AMION for all [place] phone numbers After 10:00 PM, call [place] [phone]

## 2019-12-18 NOTE — Progress Notes (Signed)
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    Initially not started on any Covid treatment as he was hemodynamically stable on room air however had increasing respiratory rate and CRP 22.9 on 1/8 and so was started on dexamethasone and remdesivir.  Actemra on hold due to HIV status.    Methasone was held but will now be reinitiated.  Cardiology consulted for elevated troponin but not deemed a candidate for cardiac intervention, likely demand ischemia and no therapies recommended.  Nephrology was consulted due to ESRD and palliative care team consulted and patient is now a DNR. He underwent Dialysis yesterday and has had low BP's.   Patient is having inadequate p.o. intake and with his recent significant weight loss and increased kilocalorie needs and protein needs the dietitian is recommending placement of core track tube placement as well as starting tube feedings.  Will need to discuss with the wife and have a goals of care conversation with palliative again involved.  Assessment & Plan:   Active Problems:   COVID-19   Combined systolic and diastolic heart failure ([place])   ESRD on dialysis ([place])   DNR (do not resuscitate)  SIRS/Failure to thrive, secondary to COVID-19, SIRS resolved -On admission: Febrile, lactic acidosis. increased respiratory rate to >30, vitals. Now on room air   -CRP peaked at 24, currently downtrending, did not get actemra  due to HIV status -Completed 4 days dexamethasone, stopped 1/11 due to uremia (BUN 99) and tolerating room air but may need to resume and will resume today -Day 5/5 Remdesivir and now Completed -Inflammatory Marker Trend and CRP is still trending upward - Recent Labs    12/16/19 0500 12/17/19 1233 12/18/19 0448  DDIMER 9.96* 6.08* 5.07*  FERRITIN 1,098* 1,376* 993*  LDH  --   --  248*  CRP 10.4* 11.0* 16.3*  -ESR was 75 and worsened to 100 so we will need to initiate steroids again  Lab Results  Component Value Date   [place] NEGATIVE 11/29/2019   [place] NEGATIVE 11/17/2019   [place] NOT DETECTED 11/06/2019   [place] NEGATIVE 11/01/2019  -SpO2: 95 % -Palliative care consulted -patient made DNR on 1/11 after palliative discussion with wife -Tylenol for fever -C/w Antitussives with Guaifenesin-Dextromethorphan 10 mL po q4hprn Cough and Chlorpheniramine-Hydrocodone 5 mL po q12hprn -C/w Zinc 220 mg po Daily and Vitamin C  -Airborne and Contact Precautions -C/w Combivent Scheduled 1 puff IH q6h and Albuterol 1-2 puff IH q4hprn -Add Flutter Valve and Incentive Spirometry  -Patient has inadequate p.o. intake so dietitian is recommending core track placement as well as tube feedings -Continue to Monitor and Trend and check CXR in AM   Nausea/Vomiting, possible uremia versus unknown medication induced, resolved -Continue Zofran -Added pepcid -continues to have inadequate p.o. intake so we will need to discuss with wife and palliative care goals of care conversation about tube feeding  Elevated D-dimer -Initially 7.4, down trended to 4.9, increased to 9.9 yesterday and is now 6.08 despite therapeutic Coumadin levels -Tolerating  room air; Repeat CXR in AM  -will check bilateral upper and lower extremity Dopplers and showed no evidence of DVT  Elevated troponin with extensive cardiac history(history of PEA arrest, combined systolic/diastolicend stage heart failure),  possibly demand ischemia in ESRD patient, stable -Troponin255->271 on admission -Asymptomatic with baseline LBBB on EKG -Cardiology consulted-no indication for cardiac intervention and recommended end-stage heart failure and recommending optimizing volume status with hemodialysis -Palliative care consulted, as above goals of care conversation had and he is now DNR -Telemetry -Poor prognosis, see Almyra Deforest, PA note from 12/10/19 for complete details  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 Eden Lathe on 11/10/2019, unfortunately this thrombosed and underwent thrombectomy by Dr. Carlis Abbott on 12/14. Eventually underwent a right SFA to common femoral vein AV loop graft by Dr. 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 every 6 as needed severe pain  History of HIV  -[name]  PreDiabetes -CBG's ranging 70-321 -C/w Insulin Aspart 0-9 Sensitive Scale q4h  Ambulatory Dysfunction -Uses cane and walker at baseline -PT/OT recommending SNF at  discharge  Glaucoma -Continue with brimonidine 0.15% ophthalmic solution 1 drop left eye twice daily, as well as dorzolamide-timolol 22.3-six-point milligrams per milliliter 1 drop in the left eye twice daily, and latanoprost 1 drop in the left eye nightly  DVT prophylaxis: Anticoagulated with Coumadin Code Status: DO NOT RESUSCITATE  Family Communication: No family present at bedside  Disposition Plan: Pending Clinical Improvement and D/C to SNF; need to have a goals of care discussion with the patient's wife as he is not taking in p.o. intake adequately and will likely need core track tube feeding if they wish  Consultants:   Cardiology  Nephrology   Palliative Care   Procedures: VENOUS DUPLEX Upper and Lower  Upper showed No DVT and Lower showed No DVT   Antimicrobials: Anti-infectives (From admission, onward)   Start     Dose/Rate Route Frequency Ordered Stop   12/18/19 2000  bictegravir-emtricitabine-tenofovir AF ([name]) 50-200-25 MG per tablet 1 tablet     1 tablet Oral Daily 12/17/19 1514     12/13/19 1000  remdesivir 100 mg in sodium chloride 0.9 % 100 mL IVPB     100 mg 200 mL/hr over 30 Minutes Intravenous Every 24 hours 12/12/19 0724 12/16/19 1152   12/12/19 1000  bictegravir-emtricitabine-tenofovir AF ([name]) 50-200-25 MG per tablet 1 tablet  Status:  Discontinued     1 tablet Oral Daily 12/11/19 2024 12/17/19 1514   12/12/19 0800  remdesivir 200 mg in sodium chloride 0.9% 250 mL IVPB  200 mg 580 mL/hr over 30 Minutes Intravenous Once 12/12/19 0724 12/12/19 1253     Subjective: Seen and examined at bedside and he was doing okay.  He was sitting on the bedpan and wanted to get off.  90 chest pain, lightheadedness or dizziness.  Still not eating very much.  No other concerns or complaints at this time.  Objective: Vitals:   12/18/19 0030 12/18/19 0400 12/18/19 0425 12/18/19 0549  BP: 100/77 (!) 110/56 (!) 110/52   Pulse: 88 79 86   Resp: (!) '24 20 20    '$ Temp: 98.3 F (36.8 C)  98.4 F (36.9 C)   TempSrc: Oral  Oral   SpO2: 96% 92% 95%   Weight:    78.5 kg  Height:        Intake/Output Summary (Last 24 hours) at 12/18/2019 1914 Last data filed at 12/18/2019 1800 Gross per 24 hour  Intake 480 ml  Output --  Net 480 ml   Filed Weights   12/17/19 0922 12/17/19 1152 12/18/19 0549  Weight: 78.5 kg 77.5 kg 78.5 kg   Examination: Physical Exam:  Constitutional: Older appearing African-American male currently in NAD and appears calm  Eyes: Lids and conjunctivae normal, sclerae anicteric  ENMT: External Ears, Nose appear normal. Grossly normal hearing. Neck: Appears normal, supple, no cervical masses, normal ROM, no appreciable thyromegaly; no JVD Respiratory: Diminished to auscultation bilaterally, no wheezing, rales, rhonchi or crackles. Normal respiratory effort and patient is not tachypenic. No accessory muscle use. Unlabored breathing  Cardiovascular: RRR, no murmurs / rubs / gallops. S1 and S2 auscultated. No extremity edema. Abdomen: Soft, non-tender, Distended. Bowel sounds positive x4.  GU: Deferred. Musculoskeletal: No clubbing / cyanosis of digits/nails. No joint deformity upper and lower extremities.  Skin: No rashes, lesions, ulcers on a limited skin evaluation. No induration; Warm and dry.  Neurologic: CN 2-12 grossly intact with no focal deficits. Romberg sign and cerebellar reflexes not assessed.  Psychiatric: Normal judgment and insight. Alert and oriented x 3. Normal mood and appropriate affect.   Data Reviewed: I have personally reviewed following labs and imaging studies  CBC: Recent Labs  Lab 12/14/19 0400 12/15/19 0500 12/16/19 0500 12/17/19 1233 12/18/19 0448  WBC 6.6 7.0 6.8 8.3 7.1  NEUTROABS 4.7 5.3 5.1 6.4 5.0  HGB 9.7* 9.8* 10.2* 11.5* 10.6*  HCT 29.3* 28.8* 30.8* 34.5* 32.2*  MCV 105.4* 104.3* 105.8* 104.9* 105.6*  PLT 247 289 283 346 841   Basic Metabolic Panel: Recent Labs  Lab  12/13/19 0423 12/13/19 0423 12/14/19 0400 12/15/19 0500 12/16/19 0500 12/17/19 0500 12/18/19 0448  NA 135   < > 136 136 138 139 138  K 5.7*   < > 5.0 5.2* 5.0 4.5 4.2  CL 91*   < > 96* 94* 95* 94* 94*  CO2 20*   < > 24 21* '25 23 26  '$ GLUCOSE 111*   < > 97 94 91 69* 103*  BUN 72*   < > 59* 99* 72* 108* 84*  CREATININE 10.48*   < > 8.44* 11.24* 8.63* 10.98* 9.78*  CALCIUM 8.5*   < > 8.3* 8.4* 8.9 8.6* 8.6*  MG 2.3  --  2.2 2.5* 2.2  --  2.2  PHOS 8.1*   < > 7.0*  6.9* 9.1* 7.3* 9.8* 8.5*   < > = values in this interval not displayed.   GFR: Estimated Creatinine Clearance: 7 mL/min (A) (by C-G formula based on SCr of 9.78 mg/dL (H)). Liver Function  Tests: Recent Labs  Lab 12/12/19 2221 12/14/19 0400 12/18/19 0448  AST  --   --  30  ALT  --   --  24  ALKPHOS  --   --  92  BILITOT  --   --  0.9  PROT  --   --  6.5  ALBUMIN 2.7* 2.5* 2.5*   No results for input(s): LIPASE, AMYLASE in the last 168 hours. No results for input(s): AMMONIA in the last 168 hours. Coagulation Profile: Recent Labs  Lab 12/14/19 0400 12/15/19 0500 12/16/19 0500 12/17/19 0500 12/18/19 0448  INR 2.3* 2.9* 3.7* 3.5* 3.6*   Cardiac Enzymes: No results for input(s): CKTOTAL, CKMB, CKMBINDEX, TROPONINI in the last 168 hours. BNP (last 3 results) No results for input(s): PROBNP in the last 8760 hours. HbA1C: No results for input(s): HGBA1C in the last 72 hours. CBG: Recent Labs  Lab 12/18/19 0013 12/18/19 0400 12/18/19 0804 12/18/19 1223 12/18/19 1743  GLUCAP 110* 107* 193* 137* 120*   Lipid Profile: No results for input(s): CHOL, HDL, LDLCALC, TRIG, CHOLHDL, LDLDIRECT in the last 72 hours. Thyroid Function Tests: No results for input(s): TSH, T4TOTAL, FREET4, T3FREE, THYROIDAB in the last 72 hours. Anemia Panel: Recent Labs    12/17/19 1233 12/18/19 0448  FERRITIN 1,376* 993*   Sepsis Labs: Recent Labs  Lab 12/11/19 2024  PROCALCITON 5.49    Recent Results (from the past  240 hour(s))  Culture, blood (Routine X 2) w Reflex to ID Panel     Status: None   Collection Time: 12/11/19  9:00 PM   Specimen: BLOOD RIGHT HAND  Result Value Ref Range Status   Specimen Description BLOOD RIGHT HAND  Final   Special Requests   Final    BOTTLES DRAWN AEROBIC ONLY Blood Culture results may not be optimal due to an inadequate volume of blood received in culture bottles   Culture   Final    NO GROWTH 5 DAYS Performed at [facility], [place] [address], [place], [place] [address]    Report Status 12/16/2019 FINAL  Final  MRSA PCR Screening     Status: None   Collection Time: 12/12/19 10:35 PM   Specimen: Nasal Mucosa; Nasopharyngeal  Result Value Ref Range Status   MRSA by PCR NEGATIVE NEGATIVE Final    Comment:        The GeneXpert MRSA Assay (FDA approved for NASAL specimens only), is one component of a comprehensive MRSA colonization surveillance program. It is not intended to diagnose MRSA infection nor to guide or monitor treatment for MRSA infections. Performed at [facility], [place] [address] [place], [place] [address]      RN Pressure Injury Documentation:     Estimated body mass index is 22.22 kg/m as calculated from the following:   Height as of this encounter: '6\' 2"'$  (1.88 m).   Weight as of this encounter: 78.5 kg.  Malnutrition Type:  Nutrition Problem: Inadequate oral intake Etiology: poor appetite, vomiting   Malnutrition Characteristics:  Signs/Symptoms: meal completion < 25%   Nutrition Interventions:  Interventions: Nepro shake, MVI, Prostat, Liberalize Diet   Radiology Studies: DG CHEST PORT 1 VIEW  Result Date: 12/18/2019 CLINICAL DATA:  Shortness of breath. Covid-19 positive. EXAM: PORTABLE CHEST 1 VIEW COMPARISON:  12/11/2019 FINDINGS: RIGHT-sided dialysis catheter tip overlies the UPPER RIGHT atrium. The heart is enlarged but stable in configuration. There are faint patchy opacities within the lungs bilaterally but  there has been some improvement  in aeration. No new consolidations. No pleural effusions or pulmonary edema. IMPRESSION: 1. Stable cardiomegaly. 2. Improving patchy opacities bilaterally. Electronically Signed   By: Nolon Nations M.D.   On: 12/18/2019 09:02   VAS [place] LOWER EXTREMITY VENOUS (DVT)  Result Date: 12/17/2019  Lower Venous Study Indications: Swelling, and Covid positive.  Limitations: Body habitus, poor ultrasound/tissue interface and restricted mobility. Performing Technologist: Maudry Mayhew MHA, RDMS, RVT, RDCS  Examination Guidelines: A complete evaluation includes B-mode imaging, spectral Doppler, color Doppler, and power Doppler as needed of all accessible portions of each vessel. Bilateral testing is considered an integral part of a complete examination. Limited examinations for reoccurring indications may be performed as noted.  +---------+---------------+---------+-----------+----------+--------------+ RIGHT    CompressibilityPhasicitySpontaneityPropertiesThrombus Aging +---------+---------------+---------+-----------+----------+--------------+ CFV      Full           Yes      Yes                                 +---------+---------------+---------+-----------+----------+--------------+ SFJ      Full                                                        +---------+---------------+---------+-----------+----------+--------------+ FV Prox  Full                                                        +---------+---------------+---------+-----------+----------+--------------+ FV Mid   Full                                                        +---------+---------------+---------+-----------+----------+--------------+ FV DistalFull                                                        +---------+---------------+---------+-----------+----------+--------------+ PFV      Full                                                         +---------+---------------+---------+-----------+----------+--------------+ POP      Full           Yes      Yes                                 +---------+---------------+---------+-----------+----------+--------------+ PTV      Full                                                        +---------+---------------+---------+-----------+----------+--------------+  PERO     Full                                                        +---------+---------------+---------+-----------+----------+--------------+   +---------+---------------+---------+-----------+----------+--------------+ LEFT     CompressibilityPhasicitySpontaneityPropertiesThrombus Aging +---------+---------------+---------+-----------+----------+--------------+ CFV      Full           Yes      Yes                                 +---------+---------------+---------+-----------+----------+--------------+ SFJ      Full                                                        +---------+---------------+---------+-----------+----------+--------------+ FV Prox  Full                                                        +---------+---------------+---------+-----------+----------+--------------+ FV Mid   Full                                                        +---------+---------------+---------+-----------+----------+--------------+ FV DistalFull                                                        +---------+---------------+---------+-----------+----------+--------------+ PFV      Full                                                        +---------+---------------+---------+-----------+----------+--------------+ POP      Full           Yes      Yes                                 +---------+---------------+---------+-----------+----------+--------------+ PTV      Full                                                         +---------+---------------+---------+-----------+----------+--------------+ PERO     Full                                                        +---------+---------------+---------+-----------+----------+--------------+  Summary: Right: There is no evidence of deep vein thrombosis in the lower extremity. No cystic structure found in the popliteal fossa. Left: There is no evidence of deep vein thrombosis in the lower extremity. No cystic structure found in the popliteal fossa.  *See table(s) above for measurements and observations. Electronically signed by Harold Barban MD on 12/17/2019 at 8:31:22 PM.    Final    Scheduled Meds: . allopurinol  100 mg Oral Once per day on Mon Wed Fri  . amiodarone  200 mg Oral Daily  . vitamin C  500 mg Oral Daily  . bictegravir-emtricitabine-tenofovir AF  1 tablet Oral Daily  . brimonidine  1 drop Left Eye BID  . calcitRIOL  0.5 mcg Oral Q T,Th,Sa-HD  . Chlorhexidine Gluconate Cloth  6 each Topical Q0600  . [START ON 12/19/2019] Chlorhexidine Gluconate Cloth  6 each Topical Q0600  . dorzolamide-timolol  1 drop Left Eye BID  . famotidine  10 mg Oral Daily  . feeding supplement (NEPRO CARB STEADY)  237 mL Oral TID BM  . feeding supplement (PRO-STAT SUGAR FREE 64)  30 mL Oral BID  . insulin aspart  0-9 Units Subcutaneous Q4H  . Ipratropium-Albuterol  1 puff Inhalation Q6H  . latanoprost  1 drop Left Eye QHS  . [START ON 12/19/2019] midodrine  10 mg Oral Q M,W,F-HD  . montelukast  10 mg Oral QHS  . multivitamin  1 tablet Oral QHS  . sucroferric oxyhydroxide  500 mg Oral TID WC  . Warfarin - Pharmacist Dosing Inpatient   Does not apply q1800  . zinc sulfate  220 mg Oral Daily   Continuous Infusions: . sodium chloride    . sodium chloride      LOS: 7 days   Kerney Elbe, DO [facility] PAGER is on [place]  If 7PM-7AM, please contact night-coverage [email or web address]

## 2019-12-19 ENCOUNTER — Encounter (HOSPITAL_COMMUNITY): Payer: Self-pay | Admitting: Internal Medicine

## 2019-12-19 ENCOUNTER — Encounter: Payer: Medicare HMO | Admitting: Family

## 2019-12-19 ENCOUNTER — Ambulatory Visit: Payer: Medicare HMO

## 2019-12-19 LAB — CBC WITH DIFFERENTIAL/PLATELET
Abs Immature Granulocytes: 0.04 10*3/uL (ref 0.00–0.07)
Basophils Absolute: 0 10*3/uL (ref 0.0–0.1)
Basophils Relative: 0 %
Eosinophils Absolute: 0 10*3/uL (ref 0.0–0.5)
Eosinophils Relative: 0 %
HCT: 29.7 % — ABNORMAL LOW (ref 39.0–52.0)
Hemoglobin: 10 g/dL — ABNORMAL LOW (ref 13.0–17.0)
Immature Granulocytes: 1 %
Lymphocytes Relative: 9 %
Lymphs Abs: 0.4 10*3/uL — ABNORMAL LOW (ref 0.7–4.0)
MCH: 35 pg — ABNORMAL HIGH (ref 26.0–34.0)
MCHC: 33.7 g/dL (ref 30.0–36.0)
MCV: 103.8 fL — ABNORMAL HIGH (ref 80.0–100.0)
Monocytes Absolute: 0.1 10*3/uL (ref 0.1–1.0)
Monocytes Relative: 2 %
Neutro Abs: 4.1 10*3/uL (ref 1.7–7.7)
Neutrophils Relative %: 88 %
Platelets: 271 10*3/uL (ref 150–400)
RBC: 2.86 MIL/uL — ABNORMAL LOW (ref 4.22–5.81)
RDW: 13.8 % (ref 11.5–15.5)
WBC: 4.7 10*3/uL (ref 4.0–10.5)
nRBC: 0 % (ref 0.0–0.2)

## 2019-12-19 LAB — COMPREHENSIVE METABOLIC PANEL
ALT: 26 U/L (ref 0–44)
AST: 27 U/L (ref 15–41)
Albumin: 2.4 g/dL — ABNORMAL LOW (ref 3.5–5.0)
Alkaline Phosphatase: 87 U/L (ref 38–126)
Anion gap: 21 — ABNORMAL HIGH (ref 5–15)
BUN: 110 mg/dL — ABNORMAL HIGH (ref 8–23)
CO2: 23 mmol/L (ref 22–32)
Calcium: 8.5 mg/dL — ABNORMAL LOW (ref 8.9–10.3)
Chloride: 92 mmol/L — ABNORMAL LOW (ref 98–111)
Creatinine, Ser: 12 mg/dL — ABNORMAL HIGH (ref 0.61–1.24)
GFR calc Af Amer: 4 mL/min — ABNORMAL LOW (ref >60)
GFR calc non Af Amer: 4 mL/min — ABNORMAL LOW (ref >60)
Glucose, Bld: 172 mg/dL — ABNORMAL HIGH (ref 70–99)
Potassium: 5.3 mmol/L — ABNORMAL HIGH (ref 3.5–5.1)
Sodium: 136 mmol/L (ref 135–145)
Total Bilirubin: 0.9 mg/dL (ref 0.3–1.2)
Total Protein: 6.7 g/dL (ref 6.5–8.1)

## 2019-12-19 LAB — GLUCOSE, CAPILLARY
Glucose-Capillary: 113 mg/dL — ABNORMAL HIGH (ref 70–99)
Glucose-Capillary: 114 mg/dL — ABNORMAL HIGH (ref 70–99)
Glucose-Capillary: 117 mg/dL — ABNORMAL HIGH (ref 70–99)
Glucose-Capillary: 155 mg/dL — ABNORMAL HIGH (ref 70–99)
Glucose-Capillary: 180 mg/dL — ABNORMAL HIGH (ref 70–99)

## 2019-12-19 LAB — FERRITIN: Ferritin: 1057 ng/mL — ABNORMAL HIGH (ref 24–336)

## 2019-12-19 LAB — PREPARE RBC (CROSSMATCH)

## 2019-12-19 LAB — D-DIMER, QUANTITATIVE: D-Dimer, Quant: 4.29 ug/mL-FEU — ABNORMAL HIGH (ref 0.00–0.50)

## 2019-12-19 LAB — FIBRINOGEN: Fibrinogen: 649 mg/dL — ABNORMAL HIGH (ref 210–475)

## 2019-12-19 LAB — LACTATE DEHYDROGENASE: LDH: 233 U/L — ABNORMAL HIGH (ref 98–192)

## 2019-12-19 LAB — PROTIME-INR
INR: 3.3 — ABNORMAL HIGH (ref 0.8–1.2)
Prothrombin Time: 33.5 seconds — ABNORMAL HIGH (ref 11.4–15.2)

## 2019-12-19 LAB — PHOSPHORUS: Phosphorus: 10 mg/dL — ABNORMAL HIGH (ref 2.5–4.6)

## 2019-12-19 LAB — SEDIMENTATION RATE: Sed Rate: 110 mm/hr — ABNORMAL HIGH (ref 0–16)

## 2019-12-19 LAB — HEMOGLOBIN AND HEMATOCRIT, BLOOD
HCT: 26.8 % — ABNORMAL LOW (ref 39.0–52.0)
Hemoglobin: 8.8 g/dL — ABNORMAL LOW (ref 13.0–17.0)

## 2019-12-19 LAB — MAGNESIUM: Magnesium: 2.4 mg/dL (ref 1.7–2.4)

## 2019-12-19 LAB — C-REACTIVE PROTEIN: CRP: 17 mg/dL — ABNORMAL HIGH (ref <1.0)

## 2019-12-19 MED ORDER — HEPARIN SODIUM (PORCINE) 1000 UNIT/ML IJ SOLN
INTRAMUSCULAR | Status: AC
  Filled 2019-12-19: qty 4

## 2019-12-19 MED ORDER — SODIUM CHLORIDE 0.9% IV SOLUTION: Freq: Once | INTRAVENOUS | Status: DC

## 2019-12-19 MED ORDER — VANCOMYCIN HCL 1500 MG/300ML IV SOLN
1500.0000 mg | Freq: Once | INTRAVENOUS | Status: AC
  Administered 2019-12-19: 1500 mg via INTRAVENOUS
  Filled 2019-12-19: qty 300

## 2019-12-19 MED ORDER — CALCITRIOL 0.5 MCG PO CAPS
0.5000 ug | ORAL_CAPSULE | ORAL | Status: DC
  Administered 2019-12-22 – 2020-01-07 (×6): 0.5 ug via ORAL
  Filled 2019-12-19 (×3): qty 2
  Filled 2019-12-19 (×3): qty 1

## 2019-12-19 MED ORDER — HEPARIN SODIUM (PORCINE) 1000 UNIT/ML IJ SOLN
INTRAMUSCULAR | Status: AC
  Filled 2019-12-19: qty 2

## 2019-12-19 NOTE — Progress Notes (Signed)
PT Cancellation Note  Patient Details Name: David [name] MRN: [id number] DOB: Aug 30, 1941   Cancelled Treatment:    Reason Eval/Treat Not Completed: (P) Patient at procedure or test/unavailable Pt off floor at HD. PT will follow back for treatment as able.   Lyndon Chenoweth B. Migdalia Dk PT, DPT Acute Rehabilitation Services Pager [phone] Office [phone]    [place] 12/19/2019, 10:25 AM

## 2019-12-19 NOTE — Progress Notes (Signed)
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    Initially not started on any Covid treatment as he was hemodynamically stable on room air however had increasing respiratory rate and CRP 22.9 on 1/8 and so was started on dexamethasone and remdesivir.  Actemra on hold due to HIV status.    Methasone was held but will now be reinitiated.  Cardiology consulted for elevated troponin but not deemed a candidate for cardiac intervention, likely demand ischemia and no therapies recommended.  Nephrology was consulted due to ESRD and palliative care team consulted and patient is now a DNR. He underwent Dialysis yesterday and has had low BP's.   Patient is having inadequate p.o. intake and with his recent significant weight loss and increased kilocalorie needs and protein needs the dietitian is recommending placement of core track tube placement as well as starting tube feedings.  Will need to discuss with the wife and have a goals of care conversation with palliative again involved.  **1/15 patient went to the dialysis unit and ended up having a rapid response called after he had not being properly hooked up and had excessive bleeding around his HD site.  He had a pool of blood found along him near his HD connection site and he felt lightheaded.  And bleeding had stopped.  Rapid response nurse to give the patient a 250 mL bolus and I ordered 2 units of PRBCs and  nephrology decided to continue HD on the patient and transfuse as needed and stopped the blood transfusion.  His warfarin was held  Assessment & Plan:   Active Problems:   COVID-19   Combined systolic and diastolic heart failure ([place])   ESRD on dialysis ([place])   DNR (do not resuscitate)  SIRS/Failure to thrive, secondary to COVID-19, SIRS resolved -On admission: Febrile, lactic acidosis. increased respiratory rate to >30, vitals. Now on room air   -CRP peaked at 24, currently downtrending, did not get actemra due to HIV status -Completed 4 days dexamethasone, stopped 1/11 due to uremia (BUN 99) and tolerating room air but may need to resume and will resume today -Day 5/5 Remdesivir and now Completed -Inflammatory Marker Trend and CRP is still trending upward - Recent Labs    12/17/19 1233 12/18/19 0448 12/19/19 0346  DDIMER 6.08* 5.07* 4.29*  FERRITIN 1,376* 993* 1,057*  LDH  --  248* 233*  CRP 11.0* 16.3* 17.0*  -ESR was 75 and worsened to 100 so we will need to initiate steroids again this was done yesterday  Lab Results  Component Value Date   SARSCOV2NAA NEGATIVE 11/29/2019   [place] NEGATIVE 11/17/2019   [place] NOT DETECTED 11/06/2019   [place] NEGATIVE 11/01/2019  -SpO2: 97 % -Palliative care consulted -patient made DNR on 1/11 after palliative discussion with wife -Tylenol for fever -C/w Antitussives with Guaifenesin-Dextromethorphan 10 mL po q4hprn Cough and Chlorpheniramine-Hydrocodone 5 mL po q12hprn -C/w Zinc 220 mg po Daily and Vitamin C  -Airborne and Contact Precautions -C/w Combivent Scheduled 1 puff IH q6h  and Albuterol 1-2 puff IH q4hprn -Add Flutter Valve and Incentive Spirometry  -Patient has inadequate p.o. intake so dietitian is recommending core track placement as well as tube feedings -Continue to Monitor and Trend and check CXR in AM   Nausea/Vomiting, possible uremia versus unknown medication induced, resolved -Continue Zofran -Added  pepcid -continues to have inadequate p.o. intake so we will need to discuss with wife and palliative care goals of care conversation about tube feeding; will need to figure out how much the patient is actually eating  Elevated D-dimer -Initially 7.4, down trended to 4.9, increased to 9.9 yesterday and is now 6.08 despite therapeutic Coumadin levels -Tolerating room air; Repeat CXR in AM  -will check bilateral upper and lower extremity Dopplers and showed no evidence of DVT  Elevated troponin with extensive cardiac history(history of PEA arrest, combined systolic/diastolicend stage heart failure), possibly demand ischemia in ESRD patient, stable -Troponin255->271 on admission -Asymptomatic with baseline LBBB on EKG -Cardiology consulted-no indication for cardiac intervention and recommended end-stage heart failure and recommending optimizing volume status with hemodialysis -Palliative care consulted, as above goals of care conversation had and he is now DNR -Telemetry -Poor prognosis, see Almyra Deforest, PA note from 12/10/19 for complete details  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 Eden Lathe on 11/10/2019, unfortunately this thrombosed and underwent thrombectomy by Dr. Carlis Abbott on 12/14. Eventually underwent a right SFA to common femoral vein AV loop graft by Dr. Donzetta Matters 12/02/19 -Nephrology consulted: HD  yesterday -Further care per nephrology and they are planning to increase his treatment times given his debility and because of his increasing BUN/creatinine the plan for 3 and half hours next hemodialysis session -Patient had some bleeding from his HD catheter today and I ordered 2 units PRBCs which were held by the nephrologist.  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 -Repeat CBC in AM   Combined systolic/diastolic end-stage heart failure -Intolerant of beta-blockers in the past and other treatments -No further options per cardiology given his end-stage -Treat volume with HD if able -I/O and daily weight -Palliative care consulted and now DNR  Chronic Hypotension -Continue Midodrine  PAF on Coumadin and amiodarone -Cw Coumadin per pharmacy and they are holding tonight's dose -C/w Amiodarone 200 mg po Daily   Chronic pain  -C/w Tramadol 50 mg p.o. every 6 as needed severe pain  History of HIV  -[name]  PreDiabetes -CBG's ranging 113-155 -C/w Insulin Aspart 0-9 Sensitive Scale q4h  Ambulatory Dysfunction -Uses cane and walker at baseline -PT/OT recommending SNF at discharge  Glaucoma -Continue with brimonidine 0.15% ophthalmic solution 1 drop left eye twice daily, as well as dorzolamide-timolol 22.3-six-point milligrams per milliliter 1 drop in the left eye twice daily, and latanoprost 1 drop in the left eye nightly  DVT prophylaxis: Anticoagulated with Coumadin Code Status: DO NOT RESUSCITATE  Family Communication: No family present at bedside  Disposition Plan: Pending Clinical Improvement and D/C to SNF;  need to have a goals of care discussion with the patient's wife as he is not taking in p.o. intake adequately and will likely need core track tube feeding if they wish  Consultants:   Cardiology  Nephrology   Palliative Care   Procedures: VENOUS DUPLEX Upper and Lower  Upper showed No DVT and Lower showed No DVT   Antimicrobials: Anti-infectives (From admission, onward)   Start     Dose/Rate Route Frequency  Ordered Stop   12/19/19 1500  vancomycin (VANCOREADY) IVPB 1500 mg/300 mL     1,500 mg 150 mL/hr over 120 Minutes Intravenous  Once 12/19/19 1446 12/19/19 1750   12/18/19 2000  bictegravir-emtricitabine-tenofovir AF ([name]) 50-200-25 MG per tablet 1 tablet     1 tablet Oral Daily 12/17/19 1514     12/13/19 1000  remdesivir 100 mg in sodium chloride 0.9 % 100 mL IVPB     100 mg 200 mL/hr over 30 Minutes Intravenous Every 24 hours 12/12/19 0724 12/16/19 1152   12/12/19 1000  bictegravir-emtricitabine-tenofovir AF ([name]) 50-200-25 MG per tablet 1 tablet  Status:  Discontinued     1 tablet Oral Daily 12/11/19 2024 12/17/19 1514   12/12/19 0800  remdesivir 200 mg in sodium chloride 0.9% 250 mL IVPB     200 mg 580 mL/hr over 30 Minutes Intravenous Once 12/12/19 0724 12/12/19 1253     Subjective: Seen and examined at bedside after rapid response was called and his HD catheter bleeding had stopped but he had a fair amount of blood.  No nausea but did feel little lightheaded.  Denied any chest pain, shortness of breath.  No other concerns or complaints at this time.  Objective: Vitals:   12/19/19 1200 12/19/19 1230 12/19/19 1237 12/19/19 1328  BP: (!) 83/60 (!) 84/54 (!) 105/58 (!) 88/67  Pulse:    (!) 103  Resp:    17  Temp:   98.4 F (36.9 C)   TempSrc:      SpO2:    97%  Weight:      Height:        Intake/Output Summary (Last 24 hours) at 12/19/2019 1910 Last data filed at 12/19/2019 1600 Gross per 24 hour  Intake 540 ml  Output --  Net 540 ml   Filed Weights   12/17/19 0922 12/17/19 1152 12/18/19 0549  Weight: 78.5 kg 77.5 kg 78.5 kg   Examination: Physical Exam:  Constitutional: Older appearing than stated age African-American male currently in no apparent distress but is complaining of some lightheadedness seen in the dialysis unit Eyes:  Lids and conjunctivae normal, sclerae anicteric  ENMT: External Ears, Nose appear normal. Grossly normal hearing. Neck: Appears  normal, supple, no cervical masses, normal ROM, no appreciable thyromegaly; no JVD Respiratory: Diminished to auscultation bilaterally, no wheezing, rales, rhonchi or crackles. Normal respiratory effort and patient is not tachypenic. No accessory muscle use.  Unlabored breathing Cardiovascular: RRR, no murmurs / rubs / gallops. S1 and S2 auscultated. No extremity edema. Abdomen: Soft, non-tender, non-distended. Bowel sounds positive.  GU: Deferred. Musculoskeletal: No clubbing / cyanosis of digits/nails. No joint deformity upper and lower extremities.  Skin: No rashes, lesions, ulcers on limited skin evaluation but does have a pool blood play next to him from his HD catheter which does not appear to be bleeding now. No induration; Warm and dry.  Neurologic: CN 2-12 grossly intact with no focal deficits. Romberg sign cerebellar reflexes not assessed.  Psychiatric: Normal judgment and insight. Alert and oriented x  3. Normal mood and appropriate affect.   Data Reviewed: I have personally reviewed following labs and imaging studies  CBC: Recent Labs  Lab 12/15/19 0500 12/15/19 0500 12/16/19 0500 12/17/19 1233 12/18/19 0448 12/19/19 0346 12/19/19 1037  WBC 7.0  --  6.8 8.3 7.1 4.7  --   NEUTROABS 5.3  --  5.1 6.4 5.0 4.1  --   HGB 9.8*   < > 10.2* 11.5* 10.6* 10.0* 8.8*  HCT 28.8*   < > 30.8* 34.5* 32.2* 29.7* 26.8*  MCV 104.3*  --  105.8* 104.9* 105.6* 103.8*  --   PLT 289  --  283 346 264 271  --    < > = values in this interval not displayed.   Basic Metabolic Panel: Recent Labs  Lab 12/14/19 0400 12/14/19 0400 12/15/19 0500 12/16/19 0500 12/17/19 0500 12/18/19 0448 12/19/19 0346  NA 136   < > 136 138 139 138 136  K 5.0   < > 5.2* 5.0 4.5 4.2 5.3*  CL 96*   < > 94* 95* 94* 94* 92*  CO2 24   < > 21* 25 23 26 23   GLUCOSE 97   < > 94 91 69* 103* 172*  BUN 59*   < > 99* 72* 108* 84* 110*  CREATININE 8.44*   < > 11.24* 8.63* 10.98* 9.78* 12.00*  CALCIUM 8.3*   < > 8.4* 8.9  8.6* 8.6* 8.5*  MG 2.2  --  2.5* 2.2  --  2.2 2.4  PHOS 7.0*  6.9*   < > 9.1* 7.3* 9.8* 8.5* 10.0*   < > = values in this interval not displayed.   GFR: Estimated Creatinine Clearance: 5.7 mL/min (A) (by C-G formula based on SCr of 12 mg/dL (H)). Liver Function Tests: Recent Labs  Lab 12/12/19 2221 12/14/19 0400 12/18/19 0448 12/19/19 0346  AST  --   --  30 27  ALT  --   --  24 26  ALKPHOS  --   --  92 87  BILITOT  --   --  0.9 0.9  PROT  --   --  6.5 6.7  ALBUMIN 2.7* 2.5* 2.5* 2.4*   No results for input(s): LIPASE, AMYLASE in the last 168 hours. No results for input(s): AMMONIA in the last 168 hours. Coagulation Profile: Recent Labs  Lab 12/15/19 0500 12/16/19 0500 12/17/19 0500 12/18/19 0448 12/19/19 0346  INR 2.9* 3.7* 3.5* 3.6* 3.3*   Cardiac Enzymes: No results for input(s): CKTOTAL, CKMB, CKMBINDEX, TROPONINI in the last 168 hours. BNP (last 3 results) No results for input(s): PROBNP in the last 8760 hours. HbA1C: No results for input(s): HGBA1C in the last 72 hours. CBG: Recent Labs  Lab 12/18/19 2012 12/19/19 0044 12/19/19 0353 12/19/19 1535 12/19/19 1719  GLUCAP 135* 180* 155* 113* 114*   Lipid Profile: No results for input(s): CHOL, HDL, LDLCALC, TRIG, CHOLHDL, LDLDIRECT in the last 72 hours. Thyroid Function Tests: No results for input(s): TSH, T4TOTAL, FREET4, T3FREE, THYROIDAB in the last 72 hours. Anemia Panel: Recent Labs    12/18/19 0448 12/19/19 0346  FERRITIN 993* 1,057*   Sepsis Labs: No results for input(s): PROCALCITON, LATICACIDVEN in the last 168 hours.  Recent Results (from the past 240 hour(s))  Culture, blood (Routine X 2) w Reflex to ID Panel     Status: None   Collection Time: 12/11/19  9:00 PM   Specimen: BLOOD RIGHT HAND  Result Value Ref Range Status   Specimen Description BLOOD  RIGHT HAND  Final   Special Requests   Final    BOTTLES DRAWN AEROBIC ONLY Blood Culture results may not be optimal due to an inadequate  volume of blood received in culture bottles   Culture   Final    NO GROWTH 5 DAYS Performed at [facility], [place] [address], [place], [place] [address]    Report Status 12/16/2019 FINAL  Final  MRSA PCR Screening     Status: None   Collection Time: 12/12/19 10:35 PM   Specimen: Nasal Mucosa; Nasopharyngeal  Result Value Ref Range Status   MRSA by PCR NEGATIVE NEGATIVE Final    Comment:        The GeneXpert MRSA Assay (FDA approved for NASAL specimens only), is one component of a comprehensive MRSA colonization surveillance program. It is not intended to diagnose MRSA infection nor to guide or monitor treatment for MRSA infections. Performed at [facility], [place] [address], [place], [place] [address]      RN Pressure Injury Documentation:     Estimated body mass index is 22.22 kg/m as calculated from the following:   Height as of this encounter: 6' 2"  (1.88 m).   Weight as of this encounter: 78.5 kg.  Malnutrition Type:  Nutrition Problem: Inadequate oral intake Etiology: poor appetite, vomiting   Malnutrition Characteristics:  Signs/Symptoms: meal completion < 25%   Nutrition Interventions:  Interventions: Nepro shake, MVI, Prostat, Liberalize Diet   Radiology Studies: DG CHEST PORT 1 VIEW  Result Date: 12/18/2019 CLINICAL DATA:  Shortness of breath. Covid-19 positive. EXAM: PORTABLE CHEST 1 VIEW COMPARISON:  12/11/2019 FINDINGS: RIGHT-sided dialysis catheter tip overlies the UPPER RIGHT atrium. The heart is enlarged but stable in configuration. There are faint patchy opacities within the lungs bilaterally but there has been some improvement in aeration. No new consolidations. No pleural effusions or pulmonary edema. IMPRESSION: 1. Stable cardiomegaly. 2. Improving patchy opacities bilaterally. Electronically Signed   By: Nolon Nations M.D.   On: 12/18/2019 09:02   Scheduled Meds: . sodium chloride   Intravenous Once  . allopurinol  100 mg Oral  Once per day on Mon Wed Fri  . amiodarone  200 mg Oral Daily  . vitamin C  500 mg Oral Daily  . bictegravir-emtricitabine-tenofovir AF  1 tablet Oral Daily  . brimonidine  1 drop Left Eye BID  . calcitRIOL  0.5 mcg Oral Q M,W,F-HD  . Chlorhexidine Gluconate Cloth  6 each Topical Q0600  . dexamethasone (DECADRON) injection  6 mg Intravenous Q24H  . dorzolamide-timolol  1 drop Left Eye BID  . famotidine  10 mg Oral Daily  . feeding supplement (NEPRO CARB STEADY)  237 mL Oral TID BM  . feeding supplement (PRO-STAT SUGAR FREE 64)  30 mL Oral BID  . heparin      . heparin      . insulin aspart  0-9 Units Subcutaneous Q4H  . Ipratropium-Albuterol  1 puff Inhalation Q6H  . latanoprost  1 drop Left Eye QHS  . midodrine  10 mg Oral Q M,W,F-HD  . montelukast  10 mg Oral QHS  . multivitamin  1 tablet Oral QHS  . sucroferric oxyhydroxide  500 mg Oral TID WC  . Warfarin - Pharmacist Dosing Inpatient   Does not apply q1800  . zinc sulfate  220 mg Oral Daily   Continuous Infusions: . sodium chloride    . sodium chloride      LOS: 8 days   Georgina Quint  Lise Auer, DO [facility] PAGER is on AMION  If 7PM-7AM, please contact night-coverage [email or web address]

## 2019-12-19 NOTE — Progress Notes (Signed)
Occupational Therapy Treatment Patient Details Name: David [name] MRN: [id number] DOB: [birth year] Today's Date: 12/19/2019    History of present illness Patient is a 79 year old male with history of combined systolic and diastolic heart failure (EF 20 to 25% 03/04/2019), iron deficiency anemia, atrial fibrillation on Coumadin only tolerates amiodarone, PEA arrest 02/2018, ESRD on HD MTTS, GERD, HIV, prostate cancer, CVA, hypertension who presented to the ED on 1/7 with complaints of generalized weakness and tremors and not feeling well x5 days.  Found to be Covid +.   OT comments  Pt seen for OT f/u with focus on BADL mobility progression. Pt continues to have slow processing and needs max VC's for problem solving through tasks. Pt c/o stomach ache on arrival and need to use commode. Pt total A +2 for bed mobility and max A +2 for use of stedy to [facility]. Total A peri care needed after BM. Once back to bed, pt c/o of nausea with emesis. Pt appearing to have difficulty expelling emesis, RN notified and brought in suction. D/c recs remain appropriate. Will continue to follow.   Follow Up Recommendations  SNF;Supervision/Assistance - 24 hour    Equipment Recommendations  None recommended by OT    Recommendations for Other Services      Precautions / Restrictions Precautions Precautions: Fall Restrictions Weight Bearing Restrictions: No       Mobility Bed Mobility Overal bed mobility: Needs Assistance Bed Mobility: Supine to Sit;Sit to Supine     Supine to sit: Total assist;+2 for physical assistance;HOB elevated Sit to supine: Total assist;+2 for physical assistance   General bed mobility comments: multimodal cues for sequencing; total A for all bed mobility  Transfers Overall transfer level: Needs assistance Equipment used: Ambulation equipment used Transfers: Sit to/from Stand;Stand Pivot Transfers Sit to Stand: Max assist;+2 physical assistance;From elevated surface          General transfer comment: pt stood from EOB, BSC, and Stedy standing frame with +2 assist to power up into standing and to facilitate hip extension for clearance of stedy pads and for peri care    Balance Overall balance assessment: Needs assistance Sitting-balance support: Feet supported Sitting balance-Leahy Scale: Zero Sitting balance - Comments: pt not assisting to maintain sitting balance EOB until holding onto Stedy frame  Postural control: Posterior lean;Right lateral lean Standing balance support: Bilateral upper extremity supported Standing balance-Leahy Scale: Zero Standing balance comment: pt requires outside support as well as B UE support on Stedy to maintain balance in standing                           ADL either performed or assessed with clinical judgement   ADL Overall ADL's : Needs assistance/impaired                         Toilet Transfer: Maximal assistance;+2 for physical assistance;BSC(use of stedy) Toilet Transfer Details (indicate cue type and reason): max A +2 to stand in stedy and use BSC for BM Toileting- Clothing Manipulation and Hygiene: Total assistance;Sit to/from stand;+2 for physical assistance       Functional mobility during ADLs: Maximal assistance;+2 for physical assistance(stedy) General ADL Comments: pt continuing to present with generalized weakness, poor activity tolerance, and cognitive impairments     Vision   Vision Assessment?: No apparent visual deficits   Perception     Praxis      Cognition Arousal/Alertness: Awake/alert Behavior During  Therapy: Flat affect Overall Cognitive Status: Impaired/Different from baseline Area of Impairment: Attention;Following commands;Safety/judgement;Awareness                   Current Attention Level: Sustained   Following Commands: Follows one step commands with increased time;Follows one step commands inconsistently Safety/Judgement: Decreased awareness  of safety Awareness: Intellectual   General Comments: flat affect, delayed processing with need of additional cues for completion of tasks        Exercises     Shoulder Instructions       General Comments nausea and emesis end of session and RN notified; VSS on RA     Pertinent Vitals/ Pain       Pain Assessment: Faces Faces Pain Scale: Hurts little more Pain Location: stomach Pain Descriptors / Indicators: Grimacing;Guarding Pain Intervention(s): Limited activity within patient's tolerance;Monitored during session;Repositioned  Home Living                                          Prior Functioning/Environment              Frequency  Min 2X/week        Progress Toward Goals  OT Goals(current goals can now be found in the care plan section)  Progress towards OT goals: Progressing toward goals  Acute Rehab OT Goals Patient Stated Goal: to feel better  OT Goal Formulation: With patient Time For Goal Achievement: 12/27/19 Potential to Achieve Goals: Good  Plan Discharge plan remains appropriate    Co-evaluation    PT/OT/SLP Co-Evaluation/Treatment: Yes Reason for Co-Treatment: For patient/therapist safety;To address functional/ADL transfers PT goals addressed during session: Mobility/safety with mobility OT goals addressed during session: ADL's and self-care      AM-PAC OT "6 Clicks" Daily Activity     Outcome Measure   Help from another person eating meals?: A Lot Help from another person taking care of personal grooming?: A Lot Help from another person toileting, which includes using toliet, bedpan, or urinal?: Total Help from another person bathing (including washing, rinsing, drying)?: A Lot Help from another person to put on and taking off regular upper body clothing?: Total Help from another person to put on and taking off regular lower body clothing?: Total 6 Click Score: 9    End of Session Equipment Utilized During  Treatment: Other (comment)(stedy)  OT Visit Diagnosis: Unsteadiness on feet (R26.81);Cognitive communication deficit (R41.841);Muscle weakness (generalized) (M62.81)   Activity Tolerance Patient tolerated treatment well   Patient Left in bed;with call bell/phone within reach;with bed alarm set   Nurse Communication Mobility status        Time: 9622-2979 OT Time Calculation (min): 40 min  Charges: OT General Charges $OT Visit: 1 Visit OT Treatments $Self Care/Home Management : 8-22 mins  Zenovia Jarred, MSOT, OTR/L [place] [facility] Office Number: [phone]  Zenovia Jarred 12/19/2019, 6:13 PM

## 2019-12-19 NOTE — Plan of Care (Signed)
  Problem: Education: Goal: Knowledge of General Education information will improve Description: Including pain rating scale, medication(s)/side effects and non-pharmacologic comfort measures Outcome: Progressing   Problem: Health Behavior/Discharge Planning: Goal: Ability to manage health-related needs will improve Outcome: Progressing   Problem: Clinical Measurements: Goal: Will remain free from infection Outcome: Progressing   

## 2019-12-19 NOTE — Significant Event (Signed)
Rapid Response Event Note  Overview:  Called to see pt who was receiving HD and found to have excessive bleeding around HD site per floor RN.     Initial Focused Assessment: On arrival, pt laying in bed, A&Ox4. A significant amount of blood was found alongside pt from HD connection site. Pt states he feels "light headed". Bleeding had stopped prior to my arrival to room. HR 96, BP 74/50, RR 18, spO2 99%. HD has been stopped and blood returned. Pt received 236ml bolus. HD RN stated he did not remove any fluid during dialysis (has been on for about an hour).   Interventions: STAT H&H Md notified TRH Md at bedside to assess- type and screen and blood transfusion ordered depending on H/H results. Renal Md at bedside- pt A&O, BP improved to 93/50, Dr Jonnie Finner decided to continue HD on pt and will transfuse if needed.   HD RN made aware of plan. Will continue HD and transfer pt back to 5W once completed. HD RN instructed to call with any questions or concerns.   H/H results: 8.8/26.8, Dr Jonnie Finner made aware and stated pt may need transfusion later today after HD depending on status. Will continue to follow.  Event Summary:  called at  [phone]   Event ended at  [address]

## 2019-12-19 NOTE — Progress Notes (Signed)
[facility] Progress Note  Name: David [name] MRN: [id number] DOB: 1941-09-12  Chief Complaint:  weakness  Subjective:   Pt seen on HD, no c/o's. Has some blood leak from cath tubing while on at about 1 hr, cleaned up and Hb checked was in 8's, will repeat 1- 2 h after HD today and give prbc's prn. Pt w/o any c/o's.  Ox 3   Background on consult:  David [name] is a 79 y.o. male with a history of ESRD on hemodialysis Monday Tuesday Thursday Saturday at [facility] who presented to the hospital with weakness, chills, and feeling unwell several days. He was found to be Covid positive.  He was seen by cardiology on 1/6 and felt to have severe end-stage heart failure with minimal options for treatment due to hypotension despite his midodrine.    Vitals:  Vitals:   12/19/19 1200 12/19/19 1230 12/19/19 1237 12/19/19 1328  BP: (!) 83/60 (!) 84/54 (!) 105/58 (!) 88/67  Pulse:    (!) 103  Resp:    17  Temp:   98.4 F (36.9 C)   TempSrc:      SpO2:    97%  Weight:      Height:         Physical Exam:   Patient not examined today directly given COVID-19 + status, utilizing data taken from chart +/- discussions w/ providers and staff.    OP HD:  [facility]  (MWF here)  4h  400/1.5  83.5kg  2/2 bath P4  AVG thigh  Hep 2000 - Mircera 75mcg IV q 2 weeks (last 12/06/19) - Calcitriol 0.52mcg PO q HD   Assessment/Plan:  # End-stage renal disease - outpt HD is [facility] schedule, on MWF sched here. With debility and ^B/Cr will need longer Rx times 3.5h for now.  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  renal diet. On velphoro  # H/o CVA   # HIV - on meds  # Atrial fibrillation - appears chronic   Sol Blazing, MD 12/19/2019 2:42 PM

## 2019-12-19 NOTE — Progress Notes (Signed)
ANTICOAGULATION CONSULT NOTE - Follow up  Pharmacy Consult for warfarin Indication: atrial fibrillation  (CHADS2VASc = 8)   Patient Measurements: Height: 6\' 2"  (188 cm) Weight: (would not weigh) IBW/kg (Calculated) : 82.2  Vital Signs: Temp: 98.6 F (37 C) (01/15 0815) BP: 93/50 (01/15 1010) Pulse Rate: 94 (01/15 1010)  Labs: Recent Labs    12/17/19 0500 12/17/19 1233 12/17/19 1233 12/18/19 0448 12/19/19 0346  HGB  --  11.5*   < > 10.6* 10.0*  HCT  --  34.5*  --  32.2* 29.7*  PLT  --  346  --  264 271  LABPROT 35.4*  --   --  35.9* 33.5*  INR 3.5*  --   --  3.6* 3.3*  CREATININE 10.98*  --   --  9.78* 12.00*   < > = values in this interval not displayed.    Estimated Creatinine Clearance: 5.7 mL/min (A) (by C-G formula based on SCr of 12 mg/dL (H)).   Assessment: 79 yo M on warfarin for Afib (CHADS2VASc = 8) admitted for COVID and elevated D-Dimer > 5 on admission. Of note, pt had INR 1.2 on 1/6 and coumadin clinic instructed him to take 10mg  that day them resume previous dose.   Supratherapeutic INR after resuming home dose, likely due to poor po intake. No reported bleeding, H/H, plt stable. High clot risk, relatively low bleed risk. Will hold again today = 4rd day of holding dose. Can likely restart coumadin dosing tomorrow,.  PTA warfarin dose 2.5mg  on Mon + Fri and 5mg  all other days  Goal of Therapy:  INR 2-3 Monitor platelets by anticoagulation protocol: Yes   Plan:  Hold Warfarin tonight Daily INR, monitor for s/sx of bleeding  Sherren Kerns, PharmD PGY1 Acute Care Pharmacy Resident  Please check AMION for all [place] phone numbers After 10:00 PM, call [place] [phone]

## 2019-12-19 NOTE — Progress Notes (Signed)
Physical Therapy Treatment Patient Details Name: David [name] MRN: [id number] DOB: May 23, 1941 Today's Date: 12/19/2019    History of Present Illness Patient is a 79 year old male with history of combined systolic and diastolic heart failure (EF 20 to 25% 03/04/2019), iron deficiency anemia, atrial fibrillation on Coumadin only tolerates amiodarone, PEA arrest 02/2018, ESRD on HD MTTS, GERD, HIV, prostate cancer, CVA, hypertension who presented to the ED on 1/7 with complaints of generalized weakness and tremors and not feeling well x5 days.  Found to be Covid +.    PT Comments    Patient seen for mobility progression. Pt initially difficult to engage in session but began assisting more once sitting EOB. Pt requires max A +2 for functional transfer training with use of Stedy standing frame this session. Pt reports upset stomach beginning of session and requested to use the bathroom. Pt transferred to [facility] and then once back to bed became nauseous and vomited. RN notified and in to assess pt. Continue to progress as tolerated with anticipated d/c to SNF for further skilled PT services.     Follow Up Recommendations  SNF;Supervision/Assistance - 24 hour     Equipment Recommendations  Other (comment)(TBD)    Recommendations for Other Services       Precautions / Restrictions Precautions Precautions: Fall Restrictions Weight Bearing Restrictions: No    Mobility  Bed Mobility Overal bed mobility: Needs Assistance Bed Mobility: Supine to Sit;Sit to Supine     Supine to sit: Total assist;+2 for physical assistance;HOB elevated Sit to supine: Total assist;+2 for physical assistance   General bed mobility comments: multimodal cues for sequencing; total A for all bed mobility  Transfers Overall transfer level: Needs assistance Equipment used: Ambulation equipment used Transfers: Sit to/from Stand;Stand Pivot Transfers Sit to Stand: Max assist;+2 physical assistance;From elevated  surface         General transfer comment: pt stood from EOB, BSC, and Stedy standing frame with +2 assist to power up into standing and to facilitate hip extension   Ambulation/Gait                 Stairs             Wheelchair Mobility    Modified Rankin (Stroke Patients Only)       Balance Overall balance assessment: Needs assistance Sitting-balance support: Feet supported Sitting balance-Leahy Scale: Zero Sitting balance - Comments: pt not assisting to maintain sitting balance EOB until holding onto Stedy frame  Postural control: Posterior lean;Right lateral lean Standing balance support: Bilateral upper extremity supported Standing balance-Leahy Scale: Zero                              Cognition Arousal/Alertness: Awake/alert Behavior During Therapy: Flat affect Overall Cognitive Status: Impaired/Different from baseline Area of Impairment: Attention;Following commands;Safety/judgement;Awareness                   Current Attention Level: Sustained   Following Commands: Follows one step commands with increased time;Follows one step commands inconsistently Safety/Judgement: Decreased awareness of safety Awareness: Intellectual   General Comments: continues to have delayed processing; flat affect overall       Exercises      General Comments General comments (skin integrity, edema, etc.): nausea and emesis end of session and RN notified; VSS on RA       Pertinent Vitals/Pain Pain Assessment: Faces Faces Pain Scale: Hurts little more Pain Location: stomach Pain  Descriptors / Indicators: Grimacing;Guarding("acid') Pain Intervention(s): Limited activity within patient's tolerance;Monitored during session;Repositioned    Home Living                      Prior Function            PT Goals (current goals can now be found in the care plan section) Progress towards PT goals: Progressing toward goals     Frequency    Min 3X/week      PT Plan Current plan remains appropriate    Co-evaluation PT/OT/SLP Co-Evaluation/Treatment: Yes Reason for Co-Treatment: For patient/therapist safety;To address functional/ADL transfers PT goals addressed during session: Mobility/safety with mobility        AM-PAC PT "6 Clicks" Mobility   Outcome Measure  Help needed turning from your back to your side while in a flat bed without using bedrails?: A Lot Help needed moving from lying on your back to sitting on the side of a flat bed without using bedrails?: Total Help needed moving to and from a bed to a chair (including a wheelchair)?: Total Help needed standing up from a chair using your arms (e.g., wheelchair or bedside chair)?: A Lot Help needed to walk in hospital room?: Total Help needed climbing 3-5 steps with a railing? : Total 6 Click Score: 8    End of Session   Activity Tolerance: Patient limited by fatigue;Other (comment)(upset stomach/nausea) Patient left: in bed;with call bell/phone within reach;with nursing/sitter in room Nurse Communication: Mobility status PT Visit Diagnosis: Other abnormalities of gait and mobility (R26.89);Muscle weakness (generalized) (M62.81);Other symptoms and signs involving the nervous system (R29.898)     Time: 6004-5997 PT Time Calculation (min) (ACUTE ONLY): 41 min  Charges:  $Gait Training: 8-22 mins $Therapeutic Activity: 8-22 mins                     Earney Navy, PTA Acute Rehabilitation Services Pager: [phone] Office: [phone]     Darliss Cheney 12/19/2019, 5:42 PM

## 2019-12-20 LAB — COMPREHENSIVE METABOLIC PANEL
ALT: 19 U/L (ref 0–44)
AST: 18 U/L (ref 15–41)
Albumin: 2.1 g/dL — ABNORMAL LOW (ref 3.5–5.0)
Alkaline Phosphatase: 68 U/L (ref 38–126)
Anion gap: 16 — ABNORMAL HIGH (ref 5–15)
BUN: 69 mg/dL — ABNORMAL HIGH (ref 8–23)
CO2: 24 mmol/L (ref 22–32)
Calcium: 8.4 mg/dL — ABNORMAL LOW (ref 8.9–10.3)
Chloride: 98 mmol/L (ref 98–111)
Creatinine, Ser: 7.98 mg/dL — ABNORMAL HIGH (ref 0.61–1.24)
GFR calc Af Amer: 7 mL/min — ABNORMAL LOW (ref >60)
GFR calc non Af Amer: 6 mL/min — ABNORMAL LOW (ref >60)
Glucose, Bld: 146 mg/dL — ABNORMAL HIGH (ref 70–99)
Potassium: 4.8 mmol/L (ref 3.5–5.1)
Sodium: 138 mmol/L (ref 135–145)
Total Bilirubin: 0.8 mg/dL (ref 0.3–1.2)
Total Protein: 5.6 g/dL — ABNORMAL LOW (ref 6.5–8.1)

## 2019-12-20 LAB — CBC WITH DIFFERENTIAL/PLATELET
Abs Immature Granulocytes: 0.03 10*3/uL (ref 0.00–0.07)
Basophils Absolute: 0 10*3/uL (ref 0.0–0.1)
Basophils Relative: 0 %
Eosinophils Absolute: 0 10*3/uL (ref 0.0–0.5)
Eosinophils Relative: 0 %
HCT: 23.9 % — ABNORMAL LOW (ref 39.0–52.0)
Hemoglobin: 8 g/dL — ABNORMAL LOW (ref 13.0–17.0)
Immature Granulocytes: 1 %
Lymphocytes Relative: 12 %
Lymphs Abs: 0.6 10*3/uL — ABNORMAL LOW (ref 0.7–4.0)
MCH: 34.9 pg — ABNORMAL HIGH (ref 26.0–34.0)
MCHC: 33.5 g/dL (ref 30.0–36.0)
MCV: 104.4 fL — ABNORMAL HIGH (ref 80.0–100.0)
Monocytes Absolute: 0.3 10*3/uL (ref 0.1–1.0)
Monocytes Relative: 5 %
Neutro Abs: 4.4 10*3/uL (ref 1.7–7.7)
Neutrophils Relative %: 82 %
Platelets: 335 10*3/uL (ref 150–400)
RBC: 2.29 MIL/uL — ABNORMAL LOW (ref 4.22–5.81)
RDW: 13.7 % (ref 11.5–15.5)
WBC: 5.3 10*3/uL (ref 4.0–10.5)
nRBC: 0 % (ref 0.0–0.2)

## 2019-12-20 LAB — MAGNESIUM: Magnesium: 2.3 mg/dL (ref 1.7–2.4)

## 2019-12-20 LAB — D-DIMER, QUANTITATIVE: D-Dimer, Quant: 2.67 ug/mL-FEU — ABNORMAL HIGH (ref 0.00–0.50)

## 2019-12-20 LAB — PROTIME-INR
INR: 4.3 (ref 0.8–1.2)
Prothrombin Time: 41.2 seconds — ABNORMAL HIGH (ref 11.4–15.2)

## 2019-12-20 LAB — GLUCOSE, CAPILLARY
Glucose-Capillary: 134 mg/dL — ABNORMAL HIGH (ref 70–99)
Glucose-Capillary: 135 mg/dL — ABNORMAL HIGH (ref 70–99)
Glucose-Capillary: 131 mg/dL — ABNORMAL HIGH (ref 70–99)
Glucose-Capillary: 132 mg/dL — ABNORMAL HIGH (ref 70–99)
Glucose-Capillary: 190 mg/dL — ABNORMAL HIGH (ref 70–99)
Glucose-Capillary: 144 mg/dL — ABNORMAL HIGH (ref 70–99)

## 2019-12-20 LAB — PHOSPHORUS: Phosphorus: 9.7 mg/dL — ABNORMAL HIGH (ref 2.5–4.6)

## 2019-12-20 LAB — FERRITIN: Ferritin: 688 ng/mL — ABNORMAL HIGH (ref 24–336)

## 2019-12-20 LAB — FIBRINOGEN: Fibrinogen: 524 mg/dL — ABNORMAL HIGH (ref 210–475)

## 2019-12-20 LAB — C-REACTIVE PROTEIN: CRP: 8.4 mg/dL — ABNORMAL HIGH (ref <1.0)

## 2019-12-20 LAB — LACTATE DEHYDROGENASE: LDH: 175 U/L (ref 98–192)

## 2019-12-20 LAB — SEDIMENTATION RATE: Sed Rate: 80 mm/hr — ABNORMAL HIGH (ref 0–16)

## 2019-12-20 NOTE — Progress Notes (Signed)
Daily Progress Note   Patient Name: David [name]       Date: 12/20/2019 DOB: 1941/02/18  Age: 79 y.o. MRN#: [id number] Attending Physician: Kerney Elbe, DO Primary Care Physician: [name], NP Admit Date: 12/11/2019  Reason for Consultation/Follow-up: Establishing goals of care and Psychosocial/spiritual support   Discussed patient with bedside RN [place].  With encouragement patient is eating better today.   Discussed feeding tubes in hemodialysis patients with CKA PA Samantha.  It is rarely done in the outpatient setting.  Patient's post dialysis nausea is most likely cause by drops in BP during dialysis.  Subjective:  Per RN patient is alert and orientated sitting in chair, conversant.  He wanted to be fed.  She talked him thru step by step feeding himself and he was able to do it.  Eating more today than yesterday.  Drank Nepro.     Discussed issue of poor PO intake with patient's [name].  She relays that patient gets a "chemical" taste in his mouth with HD and it gives him nausea afterward when he tries to eat.   We discussed trying appetite stimulant but I have concerns of worsening nausea or causing over sedation.  We discussed a feeding tube.  It may cause additional problems in a patient with end stage heart failure and end stage renal disease who requires HD 4x per week to prevent flash pulmonary edema.    [name] suggested that we wait until Monday and re-assess as he seems to be eating better at the moment.   Assessment: Patient with ESRD, CHF, admitted with COVID 9 days ago.  Antivirals and steroids re-started as labs were looking worse.  Patient overall with poor energy, poor mobility and poor PO intake.   Patient Profile/HPI:   79 y.o. male  with past  medical history of combined systolic and diastolic heart failure with an EF of 20 - 25%, prostate cancer, TIA, OSA, HIV, PEA arrest (02/2018) and ESRD on HD (started 03/2018) who was admitted from hemodialysis on 12/11/2019 with weakness and tremors.  He was found to be septic and positive for COVID 19.   Of note patient normally dialyzes 4x per week in order to prevent flash pulmonary edema.  Length of Stay: 9  Current Medications: Scheduled Meds:  . sodium chloride  Intravenous Once  . allopurinol  100 mg Oral Once per day on Mon Wed Fri  . amiodarone  200 mg Oral Daily  . vitamin C  500 mg Oral Daily  . bictegravir-emtricitabine-tenofovir AF  1 tablet Oral Daily  . brimonidine  1 drop Left Eye BID  . calcitRIOL  0.5 mcg Oral Q M,W,F-HD  . Chlorhexidine Gluconate Cloth  6 each Topical Q0600  . dexamethasone (DECADRON) injection  6 mg Intravenous Q24H  . dorzolamide-timolol  1 drop Left Eye BID  . famotidine  10 mg Oral Daily  . feeding supplement (NEPRO CARB STEADY)  237 mL Oral TID BM  . feeding supplement (PRO-STAT SUGAR FREE 64)  30 mL Oral BID  . insulin aspart  0-9 Units Subcutaneous Q4H  . Ipratropium-Albuterol  1 puff Inhalation Q6H  . latanoprost  1 drop Left Eye QHS  . midodrine  10 mg Oral Q M,W,F-HD  . montelukast  10 mg Oral QHS  . multivitamin  1 tablet Oral QHS  . sucroferric oxyhydroxide  500 mg Oral TID WC  . Warfarin - Pharmacist Dosing Inpatient   Does not apply q1800  . zinc sulfate  220 mg Oral Daily    Continuous Infusions: . sodium chloride    . sodium chloride      PRN Meds: sodium chloride, sodium chloride, acetaminophen, albuterol, alteplase, chlorpheniramine-HYDROcodone, heparin, lidocaine (PF), lidocaine-prilocaine, ondansetron **OR** ondansetron (ZOFRAN) IV, pentafluoroprop-tetrafluoroeth, senna-docusate, traMADol  Vital Signs: BP (!) 107/58 (BP Location: Left Arm)   Pulse 78   Temp 98.8 F (37.1 C)   Resp 18   Ht 6\' 2"  (1.88 m)   Wt 78.4 kg    SpO2 93%   BMI 22.19 kg/m  SpO2: SpO2: 93 % O2 Device: O2 Device: Room Air O2 Flow Rate:    Intake/output summary:   Intake/Output Summary (Last 24 hours) at 12/20/2019 1319 Last data filed at 12/20/2019 0700 Gross per 24 hour  Intake 660 ml  Output --  Net 660 ml   LBM: Last BM Date: 12/18/19 Baseline Weight: Weight: 81.6 kg Most recent weight: Weight: 78.4 kg       Palliative Assessment/Data:  40%      Patient Active Problem List   Diagnosis Date Noted  . DNR (do not resuscitate)   . Combined systolic and diastolic heart failure ([place])   . ESRD on dialysis ([place])   . COVID-19 12/11/2019  . Acute respiratory failure with hypoxemia ([place]) 11/01/2019  . Hyperkalemia 09/02/2019  . Unspecified open wound of right upper arm, initial encounter 06/21/2019  . Shortness of breath 04/12/2019  . 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 Dependence on renal dialysis ([place]) 03/27/2019  . Hypertensive heart and chronic kidney disease with heart failure and with stage 5 chronic kidney disease, or end stage renal disease ([place]) 03/27/2019  . Mesenteric ischemia ([place])   . Colonic ulcer   . Right lower quadrant pain   . Abnormal CT of the abdomen   . Ischemic colitis ([place])   . Colitis presumed infectious 03/09/2019  . Acute respiratory failure with hypoxia and hypercapnia ([place]) 03/04/2019  . Pruritus, unspecified 02/21/2019  . Acute pulmonary edema ([place]) 02/11/2019  . Pain, unspecified 01/21/2019  . Atrial fibrillation with RVR ([place]) 12/05/2018  . Paroxysmal atrial fibrillation ([place]) 11/16/2018  . LBBB (left bundle branch block) 11/16/2018  . Encounter for monitoring amiodarone therapy 11/16/2018  . Volume overload 11/01/2018  . Pulmonary edema 10/21/2018  . Gout 10/21/2018  . Hypertensive emergency 10/21/2018  .  Chronic combined systolic and diastolic heart failure, NYHA class 3 ([place]) 10/21/2018  . Leukocytosis 10/21/2018  . Chills (without fever) 09/24/2018  . Gastroesophageal reflux  disease 08/28/2018  . Chronic anticoagulation 04/03/2018  . CAD (coronary artery disease) 04/03/2018  . Palliative care encounter 03/27/2018  . Atrial fibrillation, chronic ([place]) 03/25/2018  . Anemia in chronic kidney disease 03/12/2018  . Coagulation defect, unspecified ([place]) 03/12/2018  . Secondary hyperparathyroidism of renal origin ([place]) 03/12/2018  . Palliative care by specialist   . Advance care planning   . Goals of care, counseling/discussion   . Respiratory arrest ([place])   . Acute on chronic systolic heart failure ([place])   . Chronic obstructive pulmonary disease ([place])   . HIV (human immunodeficiency virus infection) ([place])   . ESRD (end stage renal disease) ([place])   . Acute respiratory failure with hypoxia ([place]) 01/21/2018  . Pneumonia   . Aspiration pneumonia of both lower lobes due to regurgitated food ([place])   . Sepsis due to pneumonia ([place]) 11/22/2017  . AIDS ([place]) 05/14/2014  . 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 Gastroparesis 05/06/2014  . Protein-calorie malnutrition, severe ([place]) 05/05/2014  . ESRD on hemodialysis ([place]) 05/02/2014  . Hemodialysis-associated hypotension 05/02/2014  . Leaking of conjunctival drainage bleb 05/01/2014  . POAG (primary open-angle glaucoma) 05/01/2014  . Loss of weight 04/29/2014  . Conjunctivitis 04/29/2014  . 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 Low back pain 04/09/2012  . Osteoarthritis of hip 12/29/2011  . Iron deficiency anemia 06/28/2011  . Macrocytic anemia 04/02/2011  . 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 Hyperlipidemia 11/30/2010  . Transient cerebral ischemia 11/30/2010  . HYPERGLYCEMIA 11/30/2010    Palliative Care Plan    Recommendations/Plan:  PMT will continue to follow with you.    Would likely advise against a permanent feeding tube in this fragile gentleman with end stage co-morbidities.   Patient's complaint is nausea and vomiting post HD - would suggest low dose Zyprexa however  his QTC is prolonged.  Would search for other remedies to nausea as this may improve his appetite.  Will try peppermint aroma therapy in his room post HD as essential oils are available at the RN station.  Palliative to follow at SNF.  Goals of Care and Additional Recommendations:  Limitations on Scope of Treatment: Full Scope Treatment  Code Status:  DNR  Prognosis:   Unable to determine  He is at high risk of acute decline and death given ESRD, ESHF, Frailty and COVID 19.  He is certainly high risk for re-hospitalization.  Discharge Planning:  [facility] for rehab with Palliative care service follow-up  Care plan was discussed with MD, RN, CKA PA, CKA MD, Wife  Thank you for allowing the Palliative Medicine Team to assist in the care of this patient.  Total time spent:  35 min.     Greater than 50%  of this time was spent counseling and coordinating care related to the above assessment and plan.  Florentina Jenny, PA-C Palliative Medicine  Please contact Palliative MedicineTeam phone at [phone] for questions and concerns between 7 am - 7 pm.   Please see AMION for individual provider pager numbers.

## 2019-12-20 NOTE — Progress Notes (Signed)
ANTICOAGULATION CONSULT NOTE - Follow up  Pharmacy Consult for warfarin Indication: atrial fibrillation  (CHADS2VASc = 8)   Patient Measurements: Height: 6\' 2"  (188 cm) Weight: 172 lb 13.5 oz (78.4 kg) IBW/kg (Calculated) : 82.2  Vital Signs: Temp: 98.8 F (37.1 C) (01/16 1200) Temp Source: Oral (01/16 0500) BP: 107/58 (01/16 1200) Pulse Rate: 78 (01/16 0400)  Labs: Recent Labs    12/18/19 0448 12/18/19 0448 12/19/19 0346 12/19/19 0346 12/19/19 1037 12/20/19 0741  HGB 10.6*   < > 10.0*   < > 8.8* 8.0*  HCT 32.2*   < > 29.7*  --  26.8* 23.9*  PLT 264  --  271  --   --  335  LABPROT 35.9*  --  33.5*  --   --  41.2*  INR 3.6*  --  3.3*  --   --  4.3*  CREATININE 9.78*  --  12.00*  --   --  7.98*   < > = values in this interval not displayed.    Estimated Creatinine Clearance: 8.6 mL/min (A) (by C-G formula based on SCr of 7.98 mg/dL (H)).   Assessment: 79 yo M on warfarin for Afib (CHADS2VASc = 8) admitted for COVID and elevated D-Dimer > 5 on admission. Of note, pt had INR 1.2 on 1/6 and coumadin clinic instructed him to take 10mg  that day then resume previous dose.   Supratherapeutic INR of 4.3 after 4 days of holding warfarin. Bleeding episode was reported in HD yesterday. After episode, patient received 2 pRBC. H/H remains low today and plt are wnl.  Will hold again today = 5th day of holding dose.   PTA warfarin dose 2.5mg  on Mon + Fri and 5mg  all other days  Goal of Therapy:  INR 2-3 Monitor platelets by anticoagulation protocol: Yes   Plan:  Hold Warfarin tonight Daily INR, monitor for s/sx of bleeding  Acey Lav, PharmD  PGY1 [place] Resident [phone]  Please check AMION for all [place] phone numbers After 10:00 PM, call [place] [phone]

## 2019-12-20 NOTE — Progress Notes (Signed)
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    Initially not started on any Covid treatment as he was hemodynamically stable on room air however had increasing respiratory rate and CRP 22.9 on 1/8 and so was started on dexamethasone and remdesivir.  Actemra on hold due to HIV status.    Methasone was held but will now be reinitiated.  Cardiology consulted for elevated troponin but not deemed a candidate for cardiac intervention, likely demand ischemia and no therapies recommended.  Nephrology was consulted due to ESRD and palliative care team consulted and patient is now a DNR. He underwent Dialysis yesterday and has had low BP's.   Patient is having inadequate p.o. intake and with his recent significant weight loss and increased kilocalorie needs and protein needs the dietitian is recommending placement of core track tube placement as well as starting tube feedings.  Will need to discuss with the wife and have a goals of care conversation with palliative again involved.  **1/15 patient went to the dialysis unit and ended up having a rapid response called after he had not being properly hooked up and had excessive bleeding around his HD site.  He had a pool of blood found along him near his HD connection site and he felt lightheaded.  And bleeding had stopped.  Rapid response nurse to give the patient a 250 mL bolus and I ordered 2 units of PRBCs and  nephrology decided to continue HD on the patient and transfuse as needed and stopped the blood transfusion.  His warfarin was held  1/16: Warfarin continues to be held but his INR continues to trend up.  Palliative care patient's poor p.o. intake with the patient and the patient's wife and patient gets nauseous after dialysis.  Discussions about a core track tube were had and patient's wife wants to wait until Monday to reassess to see if he is eating any better.  Continues to have issues with altered taste though and only is eating a small amount of time  Assessment & Plan:   Active Problems:   COVID-19   Combined systolic and diastolic heart failure ([place])   ESRD on dialysis ([place])   DNR (do not resuscitate)  SIRS/Failure to thrive, secondary to COVID-19, SIRS resolved -On admission: Febrile, lactic acidosis. increased respiratory rate to >30, vitals. Now on room air   -CRP peaked at 24, currently downtrending, did not get actemra due to HIV status -Completed 4 days dexamethasone, stopped 1/11 due to uremia (BUN 99) and tolerating room air but may need to resume and will resume today -Day 5/5 Remdesivir and now Completed -Inflammatory Marker Trend and CRP is still trending upward - Recent Labs    12/18/19 0448 12/19/19 0346 12/20/19 0741  DDIMER 5.07* 4.29* 2.67*  FERRITIN 993* 1,057* 688*  LDH 248* 233* 175  CRP 16.3* 17.0* 8.4*  -ESR was 75 and worsened to 100 so we will need to initiate steroids again this was done the day before yesterday; since initiation of  steroids his CRP has trended downwards and his ferritin is also improved ESR is now 80  Lab Results  Component Value Date   [place] NEGATIVE 11/29/2019   [place] NEGATIVE 11/17/2019   [place] NOT DETECTED 11/06/2019   [place] NEGATIVE 11/01/2019  -SpO2: 98 % -Palliative care consulted -patient made DNR on 1/11 after palliative discussion with wife; palliative continues to follow and having discussions  about possible feeding tube and Cortrak -Tylenol for fever -C/w Antitussives with Guaifenesin-Dextromethorphan 10 mL po q4hprn Cough and Chlorpheniramine-Hydrocodone 5 mL po q12hprn -C/w Zinc 220 mg po Daily and Vitamin C  -Airborne and Contact Precautions -C/w Combivent Scheduled 1 puff IH q6h and Albuterol 1-2 puff IH q4hprn -Add Flutter Valve and Incentive Spirometry  -Patient has inadequate p.o. intake so dietitian is recommending core track placement as well as tube feedings -Continue to Monitor and Trend and check CXR in AM   Nausea/Vomiting, possible uremia versus unknown medication induced, resolved -Continue Zofran -Added pepcid -tends to have taste disturbances after hemodialysis -continues to have inadequate p.o. intake so we will need to discuss with wife and palliative care goals of care conversation about tube feeding; will need to figure out how much the patient is actually eating; wife wants to wait until Monday to reevaluate -Continues to be nauseous after dialysis and has altered taste; nephrology believes that this is secondary to his uremia given his shortened dialysis sessions and they are planning to increase regular times to 4 hours and do hemodialysis 4 times this week which should resolve his nausea  Elevated D-dimer -Trending down now -Tolerating room air; Repeat CXR in AM  -will check bilateral upper and lower extremity Dopplers and showed no evidence of DVT  Elevated troponin with extensive cardiac history(history of PEA arrest, combined systolic/diastolicend stage heart failure), possibly demand ischemia in ESRD patient, stable -Troponin255->271 on admission -Asymptomatic with baseline LBBB on EKG -Cardiology consulted-no indication for cardiac intervention and recommended end-stage heart failure and recommending optimizing volume status with hemodialysis -Palliative care consulted, as above goals of care conversation had and he is now DNR -Telemetry -Poor  prognosis, see Almyra Deforest, PA note from 12/10/19 for complete details  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 Eden Lathe on 11/10/2019, unfortunately this thrombosed and underwent thrombectomy by Dr. Carlis Abbott on 12/14. Eventually underwent a right SFA to common femoral vein AV loop graft by Dr. Donzetta Matters 12/02/19 -Nephrology consulted: HD  yesterday -Further care per nephrology and they are planning to increase his treatment times given his debility and because of his increasing BUN/creatinine the plan for 3 and half hours next hemodialysis session -Patient had some bleeding from his HD catheter yesterday and I ordered 2 units PRBCs which were held by the nephrologist.  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 -Repeat  CBC in AM continue to monitor carefully  Combined systolic/diastolic end-stage heart failure -Intolerant of beta-blockers in the past and other treatments -No further options per cardiology given his end-stage -Treat volume with HD if able and nephrology recommending increasing dialysis session times of 4 hours and dialyzing 4 times this week -I/O and daily weight -Palliative care consulted and now DNR  Chronic Hypotension -Continue Midodrine  PAF on Coumadin and amiodarone -Cw Coumadin per pharmacy and they are  holding tonight's dose again and his INR is 4.3 -C/w Amiodarone 200 mg po Daily   Chronic pain  -C/w Tramadol 50 mg p.o. every 6 as needed severe pain  History of HIV  -[name]  PreDiabetes -CBG's ranging 131-190 -C/w Insulin Aspart 0-9 Sensitive Scale q4h  Ambulatory Dysfunction -Uses cane and walker at baseline -PT/OT recommending SNF at discharge  Glaucoma -Continue with brimonidine 0.15% ophthalmic solution 1 drop left eye twice daily, as well as dorzolamide-timolol 22.3-six-point milligrams per milliliter 1 drop in the left eye twice daily, and latanoprost 1 drop in the left eye nightly  DVT prophylaxis: Anticoagulated with Coumadin Code Status: DO NOT RESUSCITATE  Family Communication: No family present at bedside  Disposition Plan: Pending Clinical Improvement and D/C to SNF; need to have a goals of care discussion with the patient's wife as he is not taking in p.o. intake adequately and will likely need core track tube feeding if they wish but for now we will try and address his nausea with hemodialysis as mentioned by the nephrologist  Consultants:   Cardiology  Nephrology   Palliative Care   Procedures: VENOUS DUPLEX Upper and Lower  Upper showed No DVT and Lower showed No DVT   Antimicrobials: Anti-infectives (From admission, onward)   Start     Dose/Rate Route Frequency Ordered Stop   12/19/19 1500  vancomycin (VANCOREADY) IVPB 1500 mg/300 mL     1,500 mg 150 mL/hr over 120 Minutes Intravenous  Once 12/19/19 1446 12/19/19 1750   12/18/19 2000  bictegravir-emtricitabine-tenofovir AF ([name]) 50-200-25 MG per tablet 1 tablet     1 tablet Oral Daily 12/17/19 1514     12/13/19 1000  remdesivir 100 mg in sodium chloride 0.9 % 100 mL IVPB     100 mg 200 mL/hr over 30 Minutes Intravenous Every 24 hours 12/12/19 0724 12/16/19 1152   12/12/19 1000  bictegravir-emtricitabine-tenofovir AF ([name]) 50-200-25 MG per tablet 1 tablet  Status:  Discontinued     1  tablet Oral Daily 12/11/19 2024 12/17/19 1514   12/12/19 0800  remdesivir 200 mg in sodium chloride 0.9% 250 mL IVPB     200 mg 580 mL/hr over 30 Minutes Intravenous Once 12/12/19 0724 12/12/19 1253     Subjective: Seen and examined at bedside sitting in the chair at bedside and states is feeling a little bit better today than he was yesterday.  Having nausea after dialysis sessions and states that he had altered taste.  No chest pain, lightheadedness or dizziness.  No other concerns or complaints at this time and states that he has to eat slowly otherwise he will vomit it up.  Objective: Vitals:   12/20/19 0400 12/20/19 0500 12/20/19 1200 12/20/19 1543  BP: (!) 100/57  (!) 107/58 (!) 90/46  Pulse: 78   82  Resp:    16  Temp:  97.8 F (36.6 C) 98.8 F (37.1 C) 98 F (36.7 C)  TempSrc:  Oral  Oral  SpO2: 90% 93%  98%  Weight:  78.4 kg  Height:        Intake/Output Summary (Last 24 hours) at 12/20/2019 1831 Last data filed at 12/20/2019 1406 Gross per 24 hour  Intake 810 ml  Output --  Net 810 ml   Filed Weights   12/17/19 1152 12/18/19 0549 12/20/19 0500  Weight: 77.5 kg 78.5 kg 78.4 kg   Examination: Physical Exam:  Constitutional: Thin older appearing than his stated age African-American male in no acute distress appears calm sitting in chair at bedside Eyes:  Lids and conjunctivae normal, sclerae anicteric  ENMT: External Ears, Nose appear normal. Grossly normal hearing.  Neck: Appears normal, supple, no cervical masses, normal ROM, no appreciable thyromegaly; no JVD Respiratory: Diminished to auscultation bilaterally, no wheezing, rales, rhonchi or crackles. Normal respiratory effort and patient is not tachypenic. No accessory muscle use.  Unlabored breathing Cardiovascular: RRR, no murmurs / rubs / gallops. S1 and S2 auscultated.  No appreciable extremity edema.  Abdomen: Soft, non-tender, non-distended. Bowel sounds positive.  GU: Deferred. Musculoskeletal: No  clubbing / cyanosis of digits/nails. No joint deformity upper and lower extremities.  Skin: No rashes, lesions, ulcers on limited skin evaluation. No induration; Warm and dry.  Neurologic: CN 2-12 grossly intact with no focal deficits. Romberg sign and cerebellar reflexes not assessed.  Psychiatric: Normal judgment and insight. Alert and oriented x 3. Normal mood and appropriate affect.   Data Reviewed: I have personally reviewed following labs and imaging studies  CBC: Recent Labs  Lab 12/16/19 0500 12/16/19 0500 12/17/19 1233 12/18/19 0448 12/19/19 0346 12/19/19 1037 12/20/19 0741  WBC 6.8  --  8.3 7.1 4.7  --  5.3  NEUTROABS 5.1  --  6.4 5.0 4.1  --  4.4  HGB 10.2*   < > 11.5* 10.6* 10.0* 8.8* 8.0*  HCT 30.8*   < > 34.5* 32.2* 29.7* 26.8* 23.9*  MCV 105.8*  --  104.9* 105.6* 103.8*  --  104.4*  PLT 283  --  346 264 271  --  335   < > = values in this interval not displayed.   Basic Metabolic Panel: Recent Labs  Lab 12/15/19 0500 12/15/19 0500 12/16/19 0500 12/17/19 0500 12/18/19 0448 12/19/19 0346 12/20/19 0741  NA 136   < > 138 139 138 136 138  K 5.2*   < > 5.0 4.5 4.2 5.3* 4.8  CL 94*   < > 95* 94* 94* 92* 98  CO2 21*   < > _0 GLUCOSE 94   < > 91 69* 103* 172* 146*  BUN 99*   < > 72* 108* 84* 110* 69*  CREATININE 11.24*   < > 8.63* 10.98* 9.78* 12.00* 7.98*  CALCIUM 8.4*   < > 8.9 8.6* 8.6* 8.5* 8.4*  MG 2.5*  --  2.2  --  2.2 2.4 2.3  PHOS 9.1*   < > 7.3* 9.8* 8.5* 10.0* 9.7*   < > = values in this interval not displayed.   GFR: Estimated Creatinine Clearance: 8.6 mL/min (A) (by C-G formula based on SCr of 7.98 mg/dL (H)). Liver Function Tests: Recent Labs  Lab 12/14/19 0400 12/18/19 0448 12/19/19 0346 12/20/19 0741  AST  --  _1 ALT  --  _2 ALKPHOS  --  92 87 68  BILITOT  --  0.9 0.9 0.8  PROT  --  6.5 6.7 5.6*  ALBUMIN 2.5* 2.5* 2.4* 2.1*   No results for input(s): LIPASE, AMYLASE in the last  168 hours. No results for  input(s): AMMONIA in the last 168 hours. Coagulation Profile: Recent Labs  Lab 12/16/19 0500 12/17/19 0500 12/18/19 0448 12/19/19 0346 12/20/19 0741  INR 3.7* 3.5* 3.6* 3.3* 4.3*   Cardiac Enzymes: No results for input(s): CKTOTAL, CKMB, CKMBINDEX, TROPONINI in the last 168 hours. BNP (last 3 results) No results for input(s): PROBNP in the last 8760 hours. HbA1C: No results for input(s): HGBA1C in the last 72 hours. CBG: Recent Labs  Lab 12/20/19 0046 12/20/19 0420 12/20/19 0752 12/20/19 1212 12/20/19 1543  GLUCAP 134* 135* 131* 132* 190*   Lipid Profile: No results for input(s): CHOL, HDL, LDLCALC, TRIG, CHOLHDL, LDLDIRECT in the last 72 hours. Thyroid Function Tests: No results for input(s): TSH, T4TOTAL, FREET4, T3FREE, THYROIDAB in the last 72 hours. Anemia Panel: Recent Labs    12/19/19 0346 12/20/19 0741  FERRITIN 1,057* 688*   Sepsis Labs: No results for input(s): PROCALCITON, LATICACIDVEN in the last 168 hours.  Recent Results (from the past 240 hour(s))  Culture, blood (Routine X 2) w Reflex to ID Panel     Status: None   Collection Time: 12/11/19  9:00 PM   Specimen: BLOOD RIGHT HAND  Result Value Ref Range Status   Specimen Description BLOOD RIGHT HAND  Final   Special Requests   Final    BOTTLES DRAWN AEROBIC ONLY Blood Culture results may not be optimal due to an inadequate volume of blood received in culture bottles   Culture   Final    NO GROWTH 5 DAYS Performed at [facility], [place] [address], [place], [place] [address]    Report Status 12/16/2019 FINAL  Final  MRSA PCR Screening     Status: None   Collection Time: 12/12/19 10:35 PM   Specimen: Nasal Mucosa; Nasopharyngeal  Result Value Ref Range Status   MRSA by PCR NEGATIVE NEGATIVE Final    Comment:        The GeneXpert MRSA Assay (FDA approved for NASAL specimens only), is one component of a comprehensive MRSA colonization surveillance program. It is not intended to diagnose  MRSA infection nor to guide or monitor treatment for MRSA infections. Performed at [facility], [place] [address], [place], [place] [address]      RN Pressure Injury Documentation:     Estimated body mass index is 22.19 kg/m as calculated from the following:   Height as of this encounter: 6' 2" (1.88 m).   Weight as of this encounter: 78.4 kg.  Malnutrition Type:  Nutrition Problem: Inadequate oral intake Etiology: poor appetite, vomiting   Malnutrition Characteristics:  Signs/Symptoms: meal completion < 25%   Nutrition Interventions:  Interventions: Nepro shake, MVI, Prostat, Liberalize Diet   Radiology Studies: No results found. Scheduled Meds:  sodium chloride   Intravenous Once   allopurinol  100 mg Oral Once per day on Mon Wed Fri   amiodarone  200 mg Oral Daily   vitamin C  500 mg Oral Daily   bictegravir-emtricitabine-tenofovir AF  1 tablet Oral Daily   brimonidine  1 drop Left Eye BID   calcitRIOL  0.5 mcg Oral Q M,W,F-HD   Chlorhexidine Gluconate Cloth  6 each Topical Q0600   dexamethasone (DECADRON) injection  6 mg Intravenous Q24H   dorzolamide-timolol  1 drop Left Eye BID   famotidine  10 mg Oral Daily   feeding supplement (NEPRO CARB STEADY)  237 mL Oral TID BM   feeding supplement (PRO-STAT SUGAR FREE 64)  30  mL Oral BID   insulin aspart  0-9 Units Subcutaneous Q4H   Ipratropium-Albuterol  1 puff Inhalation Q6H   latanoprost  1 drop Left Eye QHS   midodrine  10 mg Oral Q M,W,F-HD   montelukast  10 mg Oral QHS   multivitamin  1 tablet Oral QHS   sucroferric oxyhydroxide  500 mg Oral TID WC   Warfarin - Pharmacist Dosing Inpatient   Does not apply q1800   zinc sulfate  220 mg Oral Daily   Continuous Infusions:  sodium chloride     sodium chloride      LOS: 9 days   Kerney Elbe, DO [facility] PAGER is on AMION  If 7PM-7AM, please contact night-coverage [email or web address]

## 2019-12-20 NOTE — Progress Notes (Signed)
[facility] Progress Note  Name: David [name] MRN: [id number] DOB: [birth year]  Chief Complaint:  weakness  Subjective:   Pt seen in room. Hb 8. Having issues of nausea and taste disturbance.    Background on consult:  David [name] is a 79 y.o. male with a history of ESRD on hemodialysis Monday Tuesday Thursday Saturday at [facility] who presented to the hospital with weakness, chills, and feeling unwell several days. He was found to be Covid positive.  He was seen by cardiology on 1/6 and felt to have severe end-stage heart failure with minimal options for treatment due to hypotension despite his midodrine.    Vitals:  Vitals:   12/20/19 0000 12/20/19 0400 12/20/19 0500 12/20/19 1200  BP: (!) 93/59 (!) 100/57  (!) 107/58  Pulse: 80 78    Resp:      Temp:   97.8 F (36.6 C) 98.8 F (37.1 C)  TempSrc:   Oral   SpO2: 94% 90% 93%   Weight:   78.4 kg   Height:         Physical Exam:   Patient not examined today directly given COVID-19 + status, utilizing data taken from chart +/- discussions w/ providers and staff.    OP HD:  [facility]  (MWF here)  4h  400/1.5  83.5kg  2/2 bath P4  AVG thigh  Hep 2000 - Mircera 28mcg IV q 2 weeks (last 12/06/19) - Calcitriol 0.83mcg PO q HD   Assessment/Plan:  # End-stage renal disease - outpt HD is MTTS schedule, on MWF sched here. I am told is having sig nausea issues and taste disturbance, have to assume this is due to uremia given shortened sessoins here. Will ^ to regular times 4h and do HD 4 x this week, which should resolve any HD related nausea.  - patient more alert and interactive today  # Nausea/ poor po intake - see above  # COVID-19 infection - Therapies per primary team - on IV dexamethasone and sp remdesivir  # Combined chronic systolic and diastolic chronic end-stage heart failure - optimize volume with HD as able - Status post cardiology evaluation with little therapy to add given his  hypotension - is 4-5 kg under dry wt  # DNR - appreciate pall care assistance  # Chronic hypotension - on midodrine  # Anemia secondary to CKD - On ESA-last dosed on 1/2 as above  - had some blood loss at HD today, HD cath checked and there is no crack; will give IV vanc 1 time to cover and break in sterile procedure  # Secondary hyperparathyroidism - Will continue calcitriol inpatient and will be administered at his dialysis unit upon discharge.  renal diet. On velphoro  # H/o CVA   # HIV - on meds  # Atrial fibrillation - appears chronic   Sol Blazing, MD 12/20/2019 1:55 PM

## 2019-12-21 ENCOUNTER — Other Ambulatory Visit: Payer: Self-pay

## 2019-12-21 LAB — D-DIMER, QUANTITATIVE: D-Dimer, Quant: 2.67 ug/mL-FEU — ABNORMAL HIGH (ref 0.00–0.50)

## 2019-12-21 LAB — CBC WITH DIFFERENTIAL/PLATELET
Abs Immature Granulocytes: 0.05 10*3/uL (ref 0.00–0.07)
Basophils Absolute: 0 10*3/uL (ref 0.0–0.1)
Basophils Relative: 0 %
Eosinophils Absolute: 0 10*3/uL (ref 0.0–0.5)
Eosinophils Relative: 0 %
HCT: 23.9 % — ABNORMAL LOW (ref 39.0–52.0)
Hemoglobin: 7.9 g/dL — ABNORMAL LOW (ref 13.0–17.0)
Immature Granulocytes: 1 %
Lymphocytes Relative: 8 %
Lymphs Abs: 0.6 10*3/uL — ABNORMAL LOW (ref 0.7–4.0)
MCH: 34.8 pg — ABNORMAL HIGH (ref 26.0–34.0)
MCHC: 33.1 g/dL (ref 30.0–36.0)
MCV: 105.3 fL — ABNORMAL HIGH (ref 80.0–100.0)
Monocytes Absolute: 0.1 10*3/uL (ref 0.1–1.0)
Monocytes Relative: 2 %
Neutro Abs: 6.5 10*3/uL (ref 1.7–7.7)
Neutrophils Relative %: 89 %
Platelets: 345 10*3/uL (ref 150–400)
RBC: 2.27 MIL/uL — ABNORMAL LOW (ref 4.22–5.81)
RDW: 13.7 % (ref 11.5–15.5)
WBC: 7.3 10*3/uL (ref 4.0–10.5)
nRBC: 0 % (ref 0.0–0.2)

## 2019-12-21 LAB — LACTATE DEHYDROGENASE: LDH: 164 U/L (ref 98–192)

## 2019-12-21 LAB — COMPREHENSIVE METABOLIC PANEL
ALT: 25 U/L (ref 0–44)
AST: 20 U/L (ref 15–41)
Albumin: 2.4 g/dL — ABNORMAL LOW (ref 3.5–5.0)
Alkaline Phosphatase: 73 U/L (ref 38–126)
Anion gap: 19 — ABNORMAL HIGH (ref 5–15)
BUN: 102 mg/dL — ABNORMAL HIGH (ref 8–23)
CO2: 22 mmol/L (ref 22–32)
Calcium: 8.8 mg/dL — ABNORMAL LOW (ref 8.9–10.3)
Chloride: 96 mmol/L — ABNORMAL LOW (ref 98–111)
Creatinine, Ser: 9.83 mg/dL — ABNORMAL HIGH (ref 0.61–1.24)
GFR calc Af Amer: 5 mL/min — ABNORMAL LOW (ref >60)
GFR calc non Af Amer: 5 mL/min — ABNORMAL LOW (ref >60)
Glucose, Bld: 145 mg/dL — ABNORMAL HIGH (ref 70–99)
Potassium: 5.2 mmol/L — ABNORMAL HIGH (ref 3.5–5.1)
Sodium: 137 mmol/L (ref 135–145)
Total Bilirubin: 0.8 mg/dL (ref 0.3–1.2)
Total Protein: 5.6 g/dL — ABNORMAL LOW (ref 6.5–8.1)

## 2019-12-21 LAB — FERRITIN: Ferritin: 765 ng/mL — ABNORMAL HIGH (ref 24–336)

## 2019-12-21 LAB — PROTIME-INR
INR: 3.2 — ABNORMAL HIGH (ref 0.8–1.2)
Prothrombin Time: 32.7 seconds — ABNORMAL HIGH (ref 11.4–15.2)

## 2019-12-21 LAB — GLUCOSE, CAPILLARY
Glucose-Capillary: 119 mg/dL — ABNORMAL HIGH (ref 70–99)
Glucose-Capillary: 128 mg/dL — ABNORMAL HIGH (ref 70–99)
Glucose-Capillary: 133 mg/dL — ABNORMAL HIGH (ref 70–99)
Glucose-Capillary: 144 mg/dL — ABNORMAL HIGH (ref 70–99)
Glucose-Capillary: 150 mg/dL — ABNORMAL HIGH (ref 70–99)
Glucose-Capillary: 152 mg/dL — ABNORMAL HIGH (ref 70–99)
Glucose-Capillary: 157 mg/dL — ABNORMAL HIGH (ref 70–99)
Glucose-Capillary: 160 mg/dL — ABNORMAL HIGH (ref 70–99)
Glucose-Capillary: 258 mg/dL — ABNORMAL HIGH (ref 70–99)

## 2019-12-21 LAB — MAGNESIUM: Magnesium: 2.5 mg/dL — ABNORMAL HIGH (ref 1.7–2.4)

## 2019-12-21 LAB — SEDIMENTATION RATE: Sed Rate: 76 mm/hr — ABNORMAL HIGH (ref 0–16)

## 2019-12-21 LAB — FIBRINOGEN: Fibrinogen: 517 mg/dL — ABNORMAL HIGH (ref 210–475)

## 2019-12-21 LAB — PHOSPHORUS: Phosphorus: 10 mg/dL — ABNORMAL HIGH (ref 2.5–4.6)

## 2019-12-21 LAB — C-REACTIVE PROTEIN: CRP: 5.6 mg/dL — ABNORMAL HIGH (ref <1.0)

## 2019-12-21 MED ORDER — CHLORHEXIDINE GLUCONATE CLOTH 2 % EX PADS
6.0000 | MEDICATED_PAD | Freq: Every day | CUTANEOUS | Status: DC
  Administered 2019-12-22 – 2020-01-04 (×14): 6 via TOPICAL

## 2019-12-21 MED ORDER — IPRATROPIUM-ALBUTEROL 20-100 MCG/ACT IN AERS
1.0000 | INHALATION_SPRAY | Freq: Four times a day (QID) | RESPIRATORY_TRACT | Status: DC | PRN
  Filled 2019-12-21: qty 4

## 2019-12-21 NOTE — Progress Notes (Signed)
ANTICOAGULATION CONSULT NOTE - Follow up  Pharmacy Consult for warfarin Indication: atrial fibrillation  (CHADS2VASc = 8)   Patient Measurements: Height: 6\' 2"  (188 cm) Weight: 177 lb 7.5 oz (80.5 kg) IBW/kg (Calculated) : 82.2  Vital Signs: Temp: 97.8 F (36.6 C) (01/17 1200) Temp Source: Oral (01/17 1200) BP: 115/57 (01/17 1200) Pulse Rate: 70 (01/17 1200)  Labs: Recent Labs    12/19/19 0346 12/19/19 0346 12/19/19 1037 12/19/19 1037 12/20/19 0741 12/21/19 0500 12/21/19 0924  HGB 10.0*   < > 8.8*   < > 8.0* 7.9*  --   HCT 29.7*   < > 26.8*  --  23.9* 23.9*  --   PLT 271  --   --   --  335 345  --   LABPROT 33.5*  --   --   --  41.2*  --  32.7*  INR 3.3*  --   --   --  4.3*  --  3.2*  CREATININE 12.00*  --   --   --  7.98* 9.83*  --    < > = values in this interval not displayed.    Estimated Creatinine Clearance: 7.2 mL/min (A) (by C-G formula based on SCr of 9.83 mg/dL (H)).   Assessment: 79 yo M on warfarin for Afib (CHADS2VASc = 8) admitted for COVID and elevated D-Dimer > 5 on admission. Of note, pt had INR 1.2 on 1/6 and coumadin clinic instructed him to take 10mg  that day then resume previous dose.   Supratherapeutic INR of 3.2 after 5 days of holding warfarin. Bleeding episode was reported in HD on 1/15. After episode, patient received 2 pRBC. H/H remains low today and plt are wnl.  Will hold again today = 6th day of holding dose. RN reports, patient's appetite has been extremely poor over the last few days; however, patient today is sitting up in his chair and eating.  PTA warfarin dose 2.5mg  on Mon + Fri and 5mg  all other days  Goal of Therapy:  INR 2-3 Monitor platelets by anticoagulation protocol: Yes   Plan:  Hold Warfarin tonight x1, anticipate restarting warfarin tomorrow Daily INR, monitor for s/sx of bleeding  Acey Lav, PharmD  PGY1 [place] Resident [phone]  Please check AMION for all [place] phone numbers After  10:00 PM, call [place] [phone]

## 2019-12-21 NOTE — Patient Outreach (Signed)
[facility]) Care Management  12/21/2019  [facility] [place] August 02, 1941 [id number]   Case Closure  Patient remains inpatient greater than 10 consecutive days. Discharge plan is for possible SNF. RN CM will proceed with case closure. [facility] hospital liaison can refer back at time of discharge if appropriate.   Plan: RN CM will close case.  Enzo Montgomery, RN,BSN,CCM [place] Management Telephonic Care Management Coordinator Direct Phone: [phone] Toll Free: [phone] Fax: [phone]

## 2019-12-21 NOTE — Progress Notes (Signed)
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    Initially not started on any Covid treatment as he was hemodynamically stable on room air however had increasing respiratory rate and CRP 22.9 on 1/8 and so was started on dexamethasone and remdesivir.  Actemra on hold due to HIV status.    Methasone was held but will now be reinitiated.  Cardiology consulted for elevated troponin but not deemed a candidate for cardiac intervention, likely demand ischemia and no therapies recommended.  Nephrology was consulted due to ESRD and palliative care team consulted and patient is now a DNR. He underwent Dialysis yesterday and has had low BP's.   Patient is having inadequate p.o. intake and with his recent significant weight loss and increased kilocalorie needs and protein needs the dietitian is recommending placement of core track tube placement as well as starting tube feedings.  Will need to discuss with the wife and have a goals of care conversation with palliative again involved.  **1/15 patient went to the dialysis unit and ended up having a rapid response called after he had not being properly hooked up and had excessive bleeding around his HD site.  He had a pool of blood found along him near his HD connection site and he felt lightheaded.  And bleeding had stopped.  Rapid response nurse to give the patient a 250 mL bolus and I ordered 2 units of PRBCs and  nephrology decided to continue HD on the patient and transfuse as needed and stopped the blood transfusion.  His warfarin was held  1/16: Warfarin continues to be held but his INR continues to trend up.  Palliative care patient's poor p.o. intake with the patient and the patient's wife and patient gets nauseous after dialysis.  Discussions about a core track tube were had and patient's wife wants to wait until Monday to reassess to see if he is eating any better.  Continues to have issues with altered taste though and only is eating a small amount of time  1/17: He states that he is eating a little bit better but this is only his boost nutrition.  Nephrology planning to increase dialysis times as well as amount of dialysis sessions this week.  Assessment & Plan:   Active Problems:   COVID-19   Combined systolic and diastolic heart failure ([place])   ESRD on dialysis ([place])   DNR (do not resuscitate)  SIRS/Failure to thrive, secondary to COVID-19, SIRS resolved -On admission: Febrile, lactic acidosis. increased respiratory rate to >30, vitals. Now on room air   -CRP peaked at 24, currently downtrending, did not get actemra due to HIV status -Completed 4 days dexamethasone, stopped 1/11 due to uremia (BUN 99) and tolerating room air but may need to resume and will resume today -Day 5/5 Remdesivir and now Completed -Inflammatory Marker Trend and CRP is still trending upward - Recent Labs    12/19/19 0346 12/20/19 0741 12/21/19 0500 12/21/19 0924  DDIMER 4.29* 2.67*  --  2.67*  FERRITIN  1,057* 688* 765*  --   LDH 233* 175 164  --   CRP 17.0* 8.4* 5.6*  --   -ESR was 75 and worsened to 100 so we will need to initiate steroids again this was done and since initiation of steroids his CRP has trended downwards and his ferritin is also improved ESR is now 80 and trending further down to 76  Lab Results  Component Value Date   [place] 11/29/2019   [place] NEGATIVE 11/17/2019    [place] NOT DETECTED 11/06/2019   [place] NEGATIVE 11/01/2019  -SpO2: 98 % -Palliative care consulted -patient made DNR on 1/11 after palliative discussion with wife; palliative continues to follow and having discussions about possible feeding tube and Cortrak this will happen on Monday, 12/21/2018 -Continue with Tylenol for fever -C/w Antitussives with Guaifenesin-Dextromethorphan 10 mL po q4hprn Cough and Chlorpheniramine-Hydrocodone 5 mL po q12hprn -C/w Zinc 220 mg po Daily and Vitamin C  -Airborne and Contact Precautions -C/w Combivent Scheduled 1 puff IH q6h and Albuterol 1-2 puff IH q4hprn -Add Flutter Valve and Incentive Spirometry  -Patient has inadequate p.o. intake so dietitian is recommending core track placement as well as tube feedings -Continue to Monitor and Trend and check CXR in AM   Nausea/Vomiting, possible uremia versus unknown medication induced, resolved -Continue Zofran -Added pepcid -tends to have taste disturbances after hemodialysis -continues to have inadequate p.o. intake so we will need to discuss with wife and palliative care goals of care conversation about tube feeding; will need to figure out how much the patient is actually eating; wife wants to wait until Monday to reevaluate -Continues to be nauseous after dialysis and has altered taste; nephrology believes that this is secondary to his uremia given his shortened dialysis sessions and they are planning to increase regular times to 4 hours and do hemodialysis 4 times this week which should resolve his nausea  Elevated D-dimer -Trending down now -Tolerating room air; Repeat CXR in AM  -will check bilateral upper and lower extremity Dopplers and showed no evidence of DVT  Elevated Troponin with extensive cardiac history(history of PEA arrest, combined systolic/diastolicend stage heart failure), possibly demand ischemia in ESRD patient, stable -[id number]->271 on admission -Asymptomatic with  baseline LBBB on EKG -Cardiology consulted-no indication for cardiac intervention and recommended end-stage heart failure and recommending optimizing volume status with hemodialysis -Palliative care consulted, as above goals of care conversation had and he is now DNR -Telemetry -Poor prognosis, see Almyra Deforest, PA note from 12/10/19 for complete details  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 Eden Lathe on 11/10/2019, unfortunately this thrombosed and underwent thrombectomy by Dr. Carlis Abbott on 12/14. Eventually underwent a right SFA to common femoral vein AV loop graft by Dr. Donzetta Matters 12/02/19 -Nephrology consulted: HD  yesterday -Further care per nephrology and they are planning to increase his treatment times given his debility and because of his increasing BUN/creatinine the plan for 3 and half hours next hemodialysis session -Patient had some bleeding from his HD catheter yesterday and I ordered 2 units PRBCs which were held by the nephrologist.  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 -Repeat CBC in AM continue to monitor carefully  Combined Systolic/Diastolic end-stage heart failure -Intolerant of beta-blockers in the past and other treatments -No further options per cardiology given his  end-stage -Treat volume with HD if able and nephrology recommending increasing dialysis session times of 4 hours and dialyzing 4 times this week -I/O and daily weights; He is +3.352 Liters  -Palliative care consulted and now DNR  Chronic Hypotension -Continue Midodrine  PAF on Coumadin and amiodarone -Cw Coumadin per pharmacy and they are holding tonight's dose again and his INR is 4.3 -C/w Amiodarone 200 mg po Daily   Chronic pain  -C/w Tramadol 50 mg p.o. every 6 as needed severe pain  History of HIV  -[name]  PreDiabetes -CBG's ranging 119-258 -C/w Insulin Aspart 0-9 Sensitive Scale q4h  Ambulatory Dysfunction -Uses cane and walker at baseline -PT/OT recommending SNF at discharge  Glaucoma -Continue with Brimonidine 0.15% ophthalmic solution 1 drop left eye twice daily, as well as dorzolamide-timolol 22.3-6.8 milligrams per milliliter 1 drop in the left eye twice daily, and latanoprost 1 drop in the left eye nightly  DVT prophylaxis: Anticoagulated with Coumadin Code Status: DO NOT RESUSCITATE  Family Communication: No family present at bedside  Disposition Plan: Pending Clinical Improvement and D/C to SNF; need to have a goals of care discussion with the patient's wife as he is not taking in p.o. intake adequately and will likely need core track tube feeding if they wish but for now we will try and address his nausea with hemodialysis as mentioned by the nephrologist  Consultants:   Cardiology  Nephrology   Palliative Care   Procedures: VENOUS DUPLEX Upper and Lower  Upper showed No DVT and Lower showed No DVT   Antimicrobials: Anti-infectives (From admission, onward)   Start     Dose/Rate Route Frequency Ordered Stop   12/19/19 1500  vancomycin (VANCOREADY) IVPB 1500 mg/300 mL     1,500 mg 150 mL/hr over 120 Minutes Intravenous  Once 12/19/19 1446 12/19/19 1750   12/18/19 2000  bictegravir-emtricitabine-tenofovir AF ([name]) 50-200-25 MG per tablet 1  tablet     1 tablet Oral Daily 12/17/19 1514     12/13/19 1000  remdesivir 100 mg in sodium chloride 0.9 % 100 mL IVPB     100 mg 200 mL/hr over 30 Minutes Intravenous Every 24 hours 12/12/19 0724 12/16/19 1152   12/12/19 1000  bictegravir-emtricitabine-tenofovir AF ([name]) 50-200-25 MG per tablet 1 tablet  Status:  Discontinued     1 tablet Oral Daily 12/11/19 2024 12/17/19 1514   12/12/19 0800  remdesivir 200 mg in sodium chloride 0.9% 250 mL IVPB     200 mg 580 mL/hr over 30 Minutes Intravenous Once 12/12/19 0724 12/12/19 1253     Subjective: Seen and examined at bedside and he was resting when I walked in but he was feeling a little better and was feeling a little sleepy.  Not as much nausea today.  States that he is eating a little bit better but was only drinking boost.  No chest pain, lightheadedness or dizziness.  No other concerns or complaints at this time.  Objective: Vitals:   12/21/19 0456 12/21/19 0800 12/21/19 1200 12/21/19 1600  BP: (!) 109/55 (!) 116/55 (!) 115/57 (!) 103/53  Pulse: 67 69 70 76  Resp: _0 Temp: (!) 97.5 F (36.4 C) (!) 97.5 F (36.4 C) 97.8 F (36.6 C) 97.8 F (36.6 C)  TempSrc: Oral Oral Oral Oral  SpO2: 96% 98% 97% 98%  Weight: 80.5 kg     Height:        Intake/Output Summary (Last 24 hours) at 12/21/2019 1823 Last data filed at 12/21/2019 1400 Gross per 24 hour  Intake 967 ml  Output --  Net 967 ml   Filed Weights   12/18/19 0549 12/20/19 0500 12/21/19 0456  Weight: 78.5 kg 78.4 kg 80.5 kg   Examination: Physical Exam:  Constitutional: Thin older appearing than his stated age African-American male in NAD and  Eyes: Lids and conjunctivae normal, sclerae anicteric  ENMT: External Ears, Nose appear normal. Grossly normal hearing. Mucous membranes are moist; Neck: Appears normal, supple, no cervical masses, normal ROM, no appreciable thyromegaly; no JVD Respiratory: Diminished to auscultation bilaterally, no wheezing, rales,  rhonchi or crackles. Normal respiratory effort and patient is not tachypenic. No accessory muscle use.  Unlabored breathing Cardiovascular: RRR, no murmurs / rubs / gallops. S1 and S2 auscultated.  Slight extremity edema.  Abdomen: Soft, non-tender, non-distended.  Bowel sounds positive.  GU: Deferred. Musculoskeletal: No clubbing / cyanosis of digits/nails. No joint deformity upper and lower extremities.  Skin: No rashes, lesions, ulcers on limited skin evaluation. No induration; Warm and dry.  Neurologic: CN 2-12 grossly intact with no focal deficits. Romberg sign and cerebellar reflexes not assessed.  Psychiatric: Normal judgment and insight. Alert and oriented x 3. Normal mood and appropriate affect.   Data Reviewed: I have personally reviewed following labs and imaging studies  CBC: Recent Labs  Lab 12/17/19 1233 12/17/19 1233 12/18/19 0448 12/19/19 0346 12/19/19 1037 12/20/19 0741 12/21/19 0500  WBC 8.3  --  7.1 4.7  --  5.3 7.3  NEUTROABS 6.4  --  5.0 4.1  --  4.4 6.5  HGB 11.5*   < > 10.6* 10.0* 8.8* 8.0* 7.9*  HCT 34.5*   < > 32.2* 29.7* 26.8* 23.9* 23.9*  MCV 104.9*  --  105.6* 103.8*  --  104.4* 105.3*  PLT 346  --  264 271  --  335 345   < > = values in this interval not displayed.   Basic Metabolic Panel: Recent Labs  Lab 12/16/19 0500 12/16/19 0500 12/17/19 0500 12/18/19 0448 12/19/19 0346 12/20/19 0741 12/21/19 0500  NA 138   < > 139 138 136 138 137  K 5.0   < > 4.5 4.2 5.3* 4.8 5.2*  CL 95*   < > 94* 94* 92* 98 96*  CO2 25   < > _1 GLUCOSE 91   < > 69* 103* 172* 146* 145*  BUN 72*   < > 108* 84* 110* 69* 102*  CREATININE 8.63*   < > 10.98* 9.78* 12.00* 7.98* 9.83*  CALCIUM 8.9   < > 8.6* 8.6* 8.5* 8.4* 8.8*  MG 2.2  --   --  2.2 2.4 2.3 2.5*  PHOS 7.3*   < > 9.8* 8.5* 10.0* 9.7* 10.0*   < > = values in this interval not displayed.   GFR: Estimated Creatinine Clearance: 7.2 mL/min (A) (by C-G formula based on SCr of 9.83 mg/dL  (H)). Liver Function  Tests: Recent Labs  Lab 12/18/19 0448 12/19/19 0346 12/20/19 0741 12/21/19 0500  AST _0 ALT _1 ALKPHOS 92 87 68 73  BILITOT 0.9 0.9 0.8 0.8  PROT 6.5 6.7 5.6* 5.6*  ALBUMIN 2.5* 2.4* 2.1* 2.4*   No results for input(s): LIPASE, AMYLASE in the last 168 hours. No results for input(s): AMMONIA in the last 168 hours. Coagulation Profile: Recent Labs  Lab 12/17/19 0500 12/18/19 0448 12/19/19 0346 12/20/19 0741 12/21/19 0924  INR 3.5* 3.6* 3.3* 4.3* 3.2*   Cardiac Enzymes: No results for input(s): CKTOTAL, CKMB, CKMBINDEX, TROPONINI in the last 168 hours. BNP (last 3 results) No results for input(s): PROBNP in the last 8760 hours. HbA1C: No results for input(s): HGBA1C in the last 72 hours. CBG: Recent Labs  Lab 12/21/19 0803 12/21/19 0835 12/21/19 1224 12/21/19 1540 12/21/19 1658  GLUCAP 157* 128* 119* 152* 258*   Lipid Profile: No results for input(s): CHOL, HDL, LDLCALC, TRIG, CHOLHDL, LDLDIRECT in the last 72 hours. Thyroid Function Tests: No results for input(s): TSH, T4TOTAL, FREET4, T3FREE, THYROIDAB in the last 72 hours. Anemia Panel: Recent Labs    12/20/19 0741 12/21/19 0500  FERRITIN 688* 765*   Sepsis Labs: No results for input(s): PROCALCITON, LATICACIDVEN in the last 168 hours.  Recent Results (from the past 240 hour(s))  Culture, blood (Routine X 2) w Reflex to ID Panel     Status: None   Collection Time: 12/11/19  9:00 PM   Specimen: BLOOD RIGHT HAND  Result Value Ref Range Status   Specimen Description BLOOD RIGHT HAND  Final   Special Requests   Final    BOTTLES DRAWN AEROBIC ONLY Blood Culture results may not be optimal due to an inadequate volume of blood received in culture bottles   Culture   Final    NO GROWTH 5 DAYS Performed at [facility], [place] [address] [place], [place] [address]    Report Status 12/16/2019 FINAL  Final  MRSA PCR Screening     Status: None   Collection Time:  12/12/19 10:35 PM   Specimen: Nasal Mucosa; Nasopharyngeal  Result Value Ref Range Status   MRSA by PCR NEGATIVE NEGATIVE Final    Comment:        The GeneXpert MRSA Assay (FDA approved for NASAL specimens only), is one component of a comprehensive MRSA colonization surveillance program. It is not intended to diagnose MRSA infection nor to guide or monitor treatment for MRSA infections. Performed at [facility], [place] [address], [place], [place] [address]      RN Pressure Injury Documentation:     Estimated body mass index is 22.79 kg/m as calculated from the following:   Height as of this encounter: _2  (1.88 m).   Weight as of this encounter: 80.5 kg.  Malnutrition Type:  Nutrition Problem: Inadequate oral intake Etiology: poor appetite, vomiting   Malnutrition Characteristics:  Signs/Symptoms: meal completion < 25%   Nutrition Interventions:  Interventions: Nepro shake, MVI, Prostat, Liberalize Diet   Radiology Studies: No results found. Scheduled Meds: . sodium chloride   Intravenous Once  . allopurinol  100 mg Oral Once per day on Mon Wed Fri  . amiodarone  200 mg Oral Daily  . vitamin C  500 mg Oral Daily  . bictegravir-emtricitabine-tenofovir AF  1 tablet Oral Daily  . brimonidine  1 drop Left Eye BID  . calcitRIOL  0.5 mcg Oral Q M,W,F-HD  . [  START ON 12/22/2019] Chlorhexidine Gluconate Cloth  6 each Topical Q0600  . dexamethasone (DECADRON) injection  6 mg Intravenous Q24H  . dorzolamide-timolol  1 drop Left Eye BID  . famotidine  10 mg Oral Daily  . feeding supplement (NEPRO CARB STEADY)  237 mL Oral TID BM  . feeding supplement (PRO-STAT SUGAR FREE 64)  30 mL Oral BID  . insulin aspart  0-9 Units Subcutaneous Q4H  . Ipratropium-Albuterol  1 puff Inhalation Q6H  . latanoprost  1 drop Left Eye QHS  . midodrine  10 mg Oral Q M,W,F-HD  . montelukast  10 mg Oral QHS  . multivitamin  1 tablet Oral QHS  . sucroferric oxyhydroxide  500 mg Oral  TID WC  . Warfarin - Pharmacist Dosing Inpatient   Does not apply q1800  . zinc sulfate  220 mg Oral Daily   Continuous Infusions:   LOS: 10 days   Kerney Elbe, DO [facility] PAGER is on AMION  If 7PM-7AM, please contact night-coverage [email or web address]

## 2019-12-21 NOTE — Progress Notes (Signed)
[facility] Progress Note  Name: David [name] MRN: [id number] DOB: 1941/06/08  Chief Complaint:  weakness  Subjective:   Pt seen in room. Hb 8. Having issues of nausea and taste disturbance.    Background on consult:  David [name] is a 79 y.o. male with a history of ESRD on hemodialysis Monday Tuesday Thursday Saturday at [facility] who presented to the hospital with weakness, chills, and feeling unwell several days. He was found to be Covid positive.  He was seen by cardiology on 1/6 and felt to have severe end-stage heart failure with minimal options for treatment due to hypotension despite his midodrine.    Vitals:  Vitals:   12/21/19 0011 12/21/19 0456 12/21/19 0800 12/21/19 1200  BP: 106/60 (!) 109/55 (!) 116/55 (!) 115/57  Pulse: 69 67 69 70  Resp: 16 18 19 15   Temp: 98.8 F (37.1 C) (!) 97.5 F (36.4 C) (!) 97.5 F (36.4 C) 97.8 F (36.6 C)  TempSrc: Oral Oral Oral Oral  SpO2: 93% 96% 98% 97%  Weight:  80.5 kg    Height:         Physical Exam:   Patient not examined today directly given COVID-19 + status, utilizing data taken from chart +/- discussions w/ providers and staff.    OP HD:  [facility]  (MWF here)  4h  400/1.5  83.5kg  2/2 bath P4  AVG thigh  Hep 2000 - Mircera 33mcg IV q 2 weeks (last 12/06/19) - Calcitriol 0.70mcg PO q HD   Assessment/Plan:  # End-stage renal disease - outpt HD is MTTS schedule. Having nausea/ taste issues, presumably due to uremia. Will increase his Rx times and  HD 4x  this week for solute control.   # Nausea/ poor po intake - see above  # COVID-19 infection - Therapies per primary team - on IV dexamethasone and sp remdesivir  # Combined chronic systolic and diastolic chronic end-stage heart failure - optimize volume with HD as able - Status post cardiology evaluation with little therapy to add given his hypotension - is 3 kg under dry wt  # DNR - appreciate pall care assistance  #  Chronic hypotension - on midodrine  # Anemia secondary to CKD - On ESA-last dosed on 1/2 as above  - had blood loss from HD cath at HD Friday, HD cath checked and there was no crack so HD completed; gave IV vanc x1. Hb dropped to 7.9. Will give 1 unit prbc's w HD tomorrow given pt's weakness.   # Secondary hyperparathyroidism - Will continue calcitriol inpatient and will be administered at his dialysis unit upon discharge.  renal diet. On velphoro  # H/o CVA   # HIV - on meds  # Atrial fibrillation - appears chronic   Sol Blazing, MD 12/21/2019 1:00 PM

## 2019-12-22 ENCOUNTER — Ambulatory Visit: Payer: Self-pay

## 2019-12-22 LAB — COMPREHENSIVE METABOLIC PANEL
ALT: 27 U/L (ref 0–44)
AST: 20 U/L (ref 15–41)
Albumin: 2.8 g/dL — ABNORMAL LOW (ref 3.5–5.0)
Alkaline Phosphatase: 101 U/L (ref 38–126)
Anion gap: 17 — ABNORMAL HIGH (ref 5–15)
BUN: 48 mg/dL — ABNORMAL HIGH (ref 8–23)
CO2: 26 mmol/L (ref 22–32)
Calcium: 8.5 mg/dL — ABNORMAL LOW (ref 8.9–10.3)
Chloride: 92 mmol/L — ABNORMAL LOW (ref 98–111)
Creatinine, Ser: 4.59 mg/dL — ABNORMAL HIGH (ref 0.61–1.24)
GFR calc Af Amer: 13 mL/min — ABNORMAL LOW (ref >60)
GFR calc non Af Amer: 11 mL/min — ABNORMAL LOW (ref >60)
Glucose, Bld: 119 mg/dL — ABNORMAL HIGH (ref 70–99)
Potassium: 3.4 mmol/L — ABNORMAL LOW (ref 3.5–5.1)
Sodium: 135 mmol/L (ref 135–145)
Total Bilirubin: 0.6 mg/dL (ref 0.3–1.2)
Total Protein: 6.5 g/dL (ref 6.5–8.1)

## 2019-12-22 LAB — CBC WITH DIFFERENTIAL/PLATELET
Abs Immature Granulocytes: 0.11 10*3/uL — ABNORMAL HIGH (ref 0.00–0.07)
Basophils Absolute: 0 10*3/uL (ref 0.0–0.1)
Basophils Relative: 0 %
Eosinophils Absolute: 0 10*3/uL (ref 0.0–0.5)
Eosinophils Relative: 0 %
HCT: 25.6 % — ABNORMAL LOW (ref 39.0–52.0)
Hemoglobin: 8.7 g/dL — ABNORMAL LOW (ref 13.0–17.0)
Immature Granulocytes: 1 %
Lymphocytes Relative: 7 %
Lymphs Abs: 0.8 10*3/uL (ref 0.7–4.0)
MCH: 34.8 pg — ABNORMAL HIGH (ref 26.0–34.0)
MCHC: 34 g/dL (ref 30.0–36.0)
MCV: 102.4 fL — ABNORMAL HIGH (ref 80.0–100.0)
Monocytes Absolute: 0.6 10*3/uL (ref 0.1–1.0)
Monocytes Relative: 5 %
Neutro Abs: 10.7 10*3/uL — ABNORMAL HIGH (ref 1.7–7.7)
Neutrophils Relative %: 87 %
Platelets: 471 10*3/uL — ABNORMAL HIGH (ref 150–400)
RBC: 2.5 MIL/uL — ABNORMAL LOW (ref 4.22–5.81)
RDW: 13.6 % (ref 11.5–15.5)
WBC: 12.3 10*3/uL — ABNORMAL HIGH (ref 4.0–10.5)
nRBC: 0 % (ref 0.0–0.2)

## 2019-12-22 LAB — LACTATE DEHYDROGENASE: LDH: 168 U/L (ref 98–192)

## 2019-12-22 LAB — GLUCOSE, CAPILLARY
Glucose-Capillary: 194 mg/dL — ABNORMAL HIGH (ref 70–99)
Glucose-Capillary: 134 mg/dL — ABNORMAL HIGH (ref 70–99)
Glucose-Capillary: 152 mg/dL — ABNORMAL HIGH (ref 70–99)
Glucose-Capillary: 113 mg/dL — ABNORMAL HIGH (ref 70–99)
Glucose-Capillary: 186 mg/dL — ABNORMAL HIGH (ref 70–99)

## 2019-12-22 LAB — MAGNESIUM: Magnesium: 2.2 mg/dL (ref 1.7–2.4)

## 2019-12-22 LAB — PROTIME-INR
INR: 2.9 — ABNORMAL HIGH (ref 0.8–1.2)
Prothrombin Time: 30.4 seconds — ABNORMAL HIGH (ref 11.4–15.2)

## 2019-12-22 LAB — FERRITIN: Ferritin: 850 ng/mL — ABNORMAL HIGH (ref 24–336)

## 2019-12-22 LAB — C-REACTIVE PROTEIN: CRP: 4.1 mg/dL — ABNORMAL HIGH (ref <1.0)

## 2019-12-22 LAB — D-DIMER, QUANTITATIVE: D-Dimer, Quant: 2.6 ug/mL-FEU — ABNORMAL HIGH (ref 0.00–0.50)

## 2019-12-22 LAB — PHOSPHORUS: Phosphorus: 3.9 mg/dL (ref 2.5–4.6)

## 2019-12-22 LAB — SEDIMENTATION RATE: Sed Rate: 85 mm/hr — ABNORMAL HIGH (ref 0–16)

## 2019-12-22 LAB — FIBRINOGEN: Fibrinogen: 424 mg/dL (ref 210–475)

## 2019-12-22 MED ORDER — HEPARIN SODIUM (PORCINE) 1000 UNIT/ML IJ SOLN
INTRAMUSCULAR | Status: AC
  Administered 2019-12-22: 1000 [IU]
  Filled 2019-12-22: qty 6

## 2019-12-22 MED ORDER — WARFARIN SODIUM 2 MG PO TABS
2.0000 mg | ORAL_TABLET | Freq: Once | ORAL | Status: AC
  Administered 2019-12-22: 18:00:00 2 mg via ORAL
  Filled 2019-12-22 (×2): qty 1

## 2019-12-22 MED ORDER — SUCROFERRIC OXYHYDROXIDE 500 MG PO CHEW
1000.0000 mg | CHEWABLE_TABLET | Freq: Three times a day (TID) | ORAL | Status: DC
  Filled 2019-12-22 (×6): qty 2

## 2019-12-22 NOTE — Progress Notes (Addendum)
Daily Progress Note   Patient Name: David [name]       Date: 12/22/2019 DOB: 09-Aug-1941  Age: 79 y.o. MRN#: [id number] Attending Physician: Kerney Elbe, DO Primary Care Physician: [name], NP Admit Date: 12/11/2019  Reason for Consultation/Follow-up: Establishing goals of care and Psychosocial/spiritual support   Discussed case with Nephrology and primary [facility] MD.  Subjective:   Spoke with wife David [name] on the phone before meeting with David [name].  Explained to David [name] that he is eating 50% but his body is weak and needs more nutrition.  We discussed temporary feeding tubes and permanent feeding tubes.  We agreed to a trial of a cor trak, but to avoid a permanent feeding tube.   Then David [name] asked - "David [name], where are we really headed?  The cardiologist told me last month that we needed to have a serious conversation on the follow up visit.  I feel like they were going to recommend Hospice."  I told David [name] she asked a very important question - and I explained that we can't fix his heart, and we can't fix his kidneys or his vasculature.  We can keep him going with extra HD, extra support from a feeding tube and he would probably live in a nursing home.   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  She feels that David [name] would go there and die.   David [name] and I agreed that it makes sense to give the temporary feeding tube a trial for 5 - 7 days and if he is better then ok - go to rehab.  However if after a temporary feeding tube trial he is not improved then it is time to go to Hospice.  David [name] told me that she can not make this decision - it has to be a medical recommendation.    I went to David [name]'s room and asked David [name] to  call David [name] on his smart phone.  David [name] agreed but he was moving very slowly.  He could not pick up the phone.  I put the phone in his hand and he could not dial it.  I dialed David [name] and put her on speaker.  Thru out our conversation - David [name] moved very slowly as though he were  in a fog.  I asked David [name] orientation questions - He answered all correctly - name, location, why he is here.  I explained that we can't fix his heart, and we can't fix his kidneys or his vasculature. I told him "I'm worried that we can't make you better and you are going to continue to get weaker and live in a nursing home"   Then I asked David [name] to tell me his version of what I just said.  He replied "You're saying I'm not going to be here long."    We discussed the temporary feeding tube.  David [name] was in agreement with that.  We discussed living in a nursing facility and David [name] asked - what kind of food would I get there?  I explained that the medical recommendation is that we try the temporary feeding tube for 1 week - if he is improved then we keep going to the SNF, but if he is not improved then we recommend Hospice House.  I explained that at [facility] there would be  No more HD.  That he would be very comfortable and would be expected to pass away in 1-2 weeks.  Both David [name] and David [name] expressed agreement with this plan.  Assessment: Patient appears stable from [place] but is very deconditioned and weak.  He has difficulty taking in enough nutrition primarily due to nausea after HD.   2+ max assist to stand.  Vomited on trying to stand during PT's last visit.   Patient Profile/HPI:  79 y.o. 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  He was found to be septic and positive for COVID 19.   Length of Stay:  11  Current Medications: Scheduled Meds:  . sodium chloride   Intravenous Once  . allopurinol  100 mg Oral Once per day on Mon Wed Fri  . amiodarone  200 mg Oral Daily  . vitamin C  500 mg Oral Daily  . bictegravir-emtricitabine-tenofovir AF  1 tablet Oral Daily  . brimonidine  1 drop Left Eye BID  . calcitRIOL  0.5 mcg Oral Q M,W,F-HD  . Chlorhexidine Gluconate Cloth  6 each Topical Q0600  . dexamethasone (DECADRON) injection  6 mg Intravenous Q24H  . dorzolamide-timolol  1 drop Left Eye BID  . famotidine  10 mg Oral Daily  . feeding supplement (NEPRO CARB STEADY)  237 mL Oral TID BM  . feeding supplement (PRO-STAT SUGAR FREE 64)  30 mL Oral BID  . insulin aspart  0-9 Units Subcutaneous Q4H  . latanoprost  1 drop Left Eye QHS  . midodrine  10 mg Oral Q M,W,F-HD  . montelukast  10 mg Oral QHS  . multivitamin  1 tablet Oral QHS  . sucroferric oxyhydroxide  1,000 mg Oral TID WC  . warfarin  2 mg Oral ONCE-1800  . Warfarin - Pharmacist Dosing Inpatient   Does not apply q1800  . zinc sulfate  220 mg Oral Daily    Continuous Infusions:   PRN Meds: acetaminophen, albuterol, chlorpheniramine-HYDROcodone, Ipratropium-Albuterol, ondansetron **OR** ondansetron (ZOFRAN) IV, senna-docusate, traMADol  Physical Exam       Well developed but chronically ill appearing man.  Slow movements, weak quiet speech, unable to pick up or dial his phone. A&O, cooperative.  Vital Signs: BP Marland Kitchen)  101/56 (BP Location: Left Arm)   Pulse 96   Temp 98 F (36.7 C) (Oral)   Resp 18   Ht 6\' 2"  (1.88 m)   Wt 80 kg   SpO2 96%   BMI 22.64 kg/m  SpO2: SpO2: 96 % O2 Device: O2 Device: Room Air O2 Flow Rate:    Intake/output summary:   Intake/Output Summary (Last 24 hours) at 12/22/2019 1709 Last data filed at 12/22/2019 1445 Gross per 24 hour  Intake 350 ml  Output 1000 ml  Net -650 ml   LBM: Last BM Date: 12/20/19 Baseline Weight: Weight: 81.6 kg Most recent weight: Weight: 80 kg        Palliative Assessment/Data:  30%      Patient Active Problem List   Diagnosis Date Noted  . DNR (do not resuscitate)   . Combined systolic and diastolic heart failure ([place])   . ESRD on dialysis ([place])   . COVID-19 12/11/2019  . Acute respiratory failure with hypoxemia ([place]) 11/01/2019  . Hyperkalemia 09/02/2019  . Unspecified open wound of right upper arm, initial encounter 06/21/2019  . Shortness of breath 04/12/2019  . 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 Dependence on renal dialysis ([place]) 03/27/2019  . Hypertensive heart and chronic kidney disease with heart failure and with stage 5 chronic kidney disease, or end stage renal disease ([place]) 03/27/2019  . Mesenteric ischemia ([place])   . Colonic ulcer   . Right lower quadrant pain   . Abnormal CT of the abdomen   . Ischemic colitis ([place])   . Colitis presumed infectious 03/09/2019  . Acute respiratory failure with hypoxia and hypercapnia ([place]) 03/04/2019  . Pruritus, unspecified 02/21/2019  . Acute pulmonary edema ([place]) 02/11/2019  . Pain, unspecified 01/21/2019  . Atrial fibrillation with RVR ([place]) 12/05/2018  . Paroxysmal atrial fibrillation ([place]) 11/16/2018  . LBBB (left bundle branch block) 11/16/2018  . Encounter for monitoring amiodarone therapy 11/16/2018  . Volume overload 11/01/2018  . Pulmonary edema 10/21/2018  . Gout 10/21/2018  . Hypertensive emergency 10/21/2018  . Chronic combined systolic and diastolic heart failure, NYHA class 3 ([place]) 10/21/2018  . Leukocytosis 10/21/2018  . Chills (without fever) 09/24/2018  . Gastroesophageal reflux disease 08/28/2018  . Chronic anticoagulation 04/03/2018  . CAD (coronary artery disease) 04/03/2018  . Palliative care encounter 03/27/2018  . Atrial fibrillation, chronic ([place]) 03/25/2018  . Anemia in chronic kidney disease 03/12/2018  . Coagulation defect, unspecified ([place]) 03/12/2018  . Secondary hyperparathyroidism of renal origin ([place]) 03/12/2018  . Palliative care by  specialist   . Advance care planning   . Goals of care, counseling/discussion   . Respiratory arrest ([place])   . Acute on chronic systolic heart failure ([place])   . Chronic obstructive pulmonary disease ([place])   . HIV (human immunodeficiency virus infection) ([place])   . ESRD (end stage renal disease) ([place])   . Acute respiratory failure with hypoxia ([place]) 01/21/2018  . Pneumonia   . Aspiration pneumonia of both lower lobes due to regurgitated food ([place])   . Sepsis due to pneumonia ([place]) 11/22/2017  . AIDS ([place]) 05/14/2014  . 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 Gastroparesis 05/06/2014  . Protein-calorie malnutrition, severe ([place]) 05/05/2014  . ESRD on hemodialysis ([place]) 05/02/2014  . Hemodialysis-associated hypotension 05/02/2014  . Leaking of conjunctival drainage bleb 05/01/2014  . POAG (primary open-angle glaucoma) 05/01/2014  . Loss of weight 04/29/2014  . Conjunctivitis 04/29/2014  . 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 Low back pain 04/09/2012  . Osteoarthritis  of hip 12/29/2011  . Iron deficiency anemia 06/28/2011  . Macrocytic anemia 04/02/2011  . 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 Hyperlipidemia 11/30/2010  . Transient cerebral ischemia 11/30/2010  . HYPERGLYCEMIA 11/30/2010    Palliative Care Plan    Recommendations/Plan:  Trial temporary feeding tube for 5-7 days.   If improved then DC to SNF.  No Permanent feeding tube.  If not improved in 5-7 days DC hemodialysis and refer to [facility] for end of life care.  Goals of Care and Additional Recommendations:  Limitations on Scope of Treatment: Full Scope Treatment  Code Status:  DNR  Prognosis:   < 3 months even with full aggressive care.  <  2 weeks if he stops dialysis   Discharge Planning:  To Be Determined.  SNF with Palliative vs stopping HD and Hospice House.  Care plan was discussed with Attending MD, Wife, Patient.  Thank you for allowing the Palliative Medicine  Team to assist in the care of this patient.  Total time spent:  75 min. Time in 4:15 Time out: 5:30     Greater than 50%  of this time was spent counseling and coordinating care related to the above assessment and plan.  Florentina Jenny, PA-C Palliative Medicine  Please contact Palliative MedicineTeam phone at [phone] for questions and concerns between 7 am - 7 pm.   Please see AMION for individual provider pager numbers.

## 2019-12-22 NOTE — Progress Notes (Signed)
ANTICOAGULATION CONSULT NOTE - Follow up  Pharmacy Consult for warfarin Indication: atrial fibrillation  (CHADS2VASc = 8)   Patient Measurements: Height: 6\' 2"  (188 cm) Weight: 178 lb 9.2 oz (81 kg) IBW/kg (Calculated) : 82.2  Vital Signs: Temp: 98 F (36.7 C) (01/18 0928) Temp Source: Oral (01/18 0928) BP: 90/50 (01/18 1130) Pulse Rate: 78 (01/18 1130)  Labs: Recent Labs    12/20/19 0741 12/21/19 0500 12/21/19 0924 12/22/19 0406  HGB 8.0* 7.9*  --   --   HCT 23.9* 23.9*  --   --   PLT 335 345  --   --   LABPROT 41.2*  --  32.7* 30.4*  INR 4.3*  --  3.2* 2.9*  CREATININE 7.98* 9.83*  --   --     Estimated Creatinine Clearance: 7.2 mL/min (A) (by C-G formula based on SCr of 9.83 mg/dL (H)).   Assessment: 79 yo M on warfarin for Afib (CHADS2VASc = 8) admitted 12/11/19 for COVID and elevated D-Dimer > 5 on admission. Of note, pt had INR 1.2 on 1/6 and coumadin clinic instructed him to take 10mg  that day then resume previous dose.   INR has improved to therapeutic INR 2.9 today after 6 days of holding warfarin due to previous supratherapeutic INRs. Bleeding episode was reported in HD on 1/15. After episode, patient received 2 pRBC. H/H remained low 7.9/23.9 and plt are wnl on 12/21/19.  Nausea/ RN reported 1/17 patient's appetite has been extremely poor over the last few days; however, improved eating 40% meal on 1/17 and 50% meal eaten this morning.  PTA warfarin dose 2.5mg  on Mon + Fri and 5mg  all other days  Goal of Therapy:  INR 2-3 Monitor platelets by anticoagulation protocol: Yes   Plan:  Warfarin 2 mg tonight x1 Daily INR, monitor for s/sx of bleeding   Nicole Cella, RPh Clinical Pharmacist Please check AMION for all [place] phone numbers After 10:00 PM, call [place]

## 2019-12-22 NOTE — Progress Notes (Signed)
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    Initially not started on any Covid treatment as he was hemodynamically stable on room air however had increasing respiratory rate and CRP 22.9 on 1/8 and so was started on dexamethasone and remdesivir.  Actemra on hold due to HIV status.    Methasone was held but will now be reinitiated.  Cardiology consulted for elevated troponin but not deemed a candidate for cardiac intervention, likely demand ischemia and no therapies recommended.  Nephrology was consulted due to ESRD and palliative care team consulted and patient is now a DNR. He underwent Dialysis yesterday and has had low BP's.   Patient is having inadequate p.o. intake and with his recent significant weight loss and increased kilocalorie needs and protein needs the dietitian is recommending placement of core track tube placement as well as starting tube feedings.  Will need to discuss with the wife and have a goals of care conversation with palliative again involved.  **1/15 patient went to the dialysis unit and ended up having a rapid response called after he had not being properly hooked up and had excessive bleeding around his HD site.  He had a pool of blood found along him near his HD connection site and he felt lightheaded.  And bleeding had stopped.  Rapid response nurse to give the patient a 250 mL bolus and I ordered 2 units of PRBCs and  nephrology decided to continue HD on the patient and transfuse as needed and stopped the blood transfusion.  His warfarin was held  1/16: Warfarin continues to be held but his INR continues to trend up.  Palliative care patient's poor p.o. intake with the patient and the patient's wife and patient gets nauseous after dialysis.  Discussions about a core track tube were had and patient's wife wants to wait until Monday to reassess to see if he is eating any better.  Continues to have issues with altered taste though and only is eating a small amount of time  1/17: He states that he is eating a little bit better but this is only his boost nutrition.  Nephrology planning to increase dialysis times as well as amount of dialysis sessions this week.  1/18: Seen and examined during dialysis and he states that he is doing okay but was wanting to sleep someone.  Not as nauseous but refusing his VELPHORO because it causes him nausea.  Patient did undergo hemodialysis today but will go Monday Wednesday Friday per Dr. Aron Baba note given hospital census and if patient not improving will go on Saturday then as well.  Appetite per nursing has been a little bit better but palliative discussed about core track tube feeding in case is necessary and patient is agreeable.  We will order a calorie count and continue monitor patient's oral intake and if necessary will place a core track as palliative end-of-life discussed this as well as the patient and they will try a trial of core track for 1 week and  if not improving will go to [facility].  Assessment & Plan:   Active Problems:   COVID-19   Combined systolic and diastolic heart failure ([place])   ESRD on dialysis ([place])   DNR (do not resuscitate)  SIRS/Failure to thrive, secondary to COVID-19, SIRS resolved -On admission: Febrile, lactic acidosis. increased respiratory rate to >30, vitals. Now on room air   -CRP peaked at 24, currently downtrending, did not get actemra  due to HIV status -Completed 4 days dexamethasone, stopped 1/11 due to uremia (BUN 99) and tolerating room air but may need to resume and will resume today -Day 5/5 Remdesivir and now Completed -Inflammatory Marker Trend and CRP is still trending upward - Recent Labs    12/20/19 0741 12/21/19 0500 12/21/19 0924 12/22/19 0406  DDIMER 2.67*  --  2.67* 2.60*  FERRITIN 688* 765*  --  850*  LDH 175 164  --  168  CRP 8.4* 5.6*  --  4.1*  -ESR was 75 and worsened to 100 so we will need to initiate steroids again this was done and since initiation of steroids his CRP has trended downwards and his ferritin is also improved ESR is now 80 and trending further down to 76 is now bumped up to 85  Lab Results  Component Value Date   [place] 11/29/2019   [place] NEGATIVE 11/17/2019   [place] NOT DETECTED 11/06/2019   [place] NEGATIVE 11/01/2019  -SpO2: 96 % -Palliative care consulted -patient made DNR on 1/11 after palliative discussion with wife; palliative continues to follow and having discussions about possible feeding tube and Cortrak this will happen on Monday, 12/21/2018; patient is agreeable for Cortrak and wife is agreeable as well and will try this short-term for 1 week however since he is eating little bit better will order calorie count first to see if he really needs it and have nutritionist reevaluate -Continue with Tylenol for fever -C/w Antitussives with Guaifenesin-Dextromethorphan 10 mL po q4hprn Cough and Chlorpheniramine-Hydrocodone 5 mL po q12hprn -C/w Zinc 220 mg po Daily and Vitamin C  -Airborne and Contact Precautions -C/w Combivent Scheduled 1 puff IH q6h and Albuterol 1-2 puff IH q4hprn -Add Flutter Valve and Incentive Spirometry  -Patient has had inadequate p.o. intake so dietitian is recommending Cortrack placement as well as tube feedings but will order calorie count now nursing reports that he is eating better -Continue to Monitor and Trend and  check CXR in AM   Nausea/Vomiting, possible uremia versus unknown medication induced, improving -Continue Zofran -Added pepcid -tends to have taste disturbances after hemodialysis -continues to have inadequate p.o. intake so we will need to discuss with wife and palliative care goals of care conversation about tube feeding; will need to figure out how much the patient is actually eating; wife wants to wait until Monday to reevaluate -Continues to be nauseous after dialysis and has altered taste; nephrology believes that this is secondary to his uremia given his shortened dialysis sessions and they are planning to increase regular times to 4 hours and do hemodialysis 4 times this week which should resolve his nausea; unfortunately Dr. Augustin Coupe feels that 4 times week hemodialysis may be difficult given the hospital since his with ESRD.  Currently he has been written for Monday Wednesday Friday and if necessary he will go on Saturday  Elevated D-dimer -Trending down now and last checked was 2.60 -Tolerating room air; Repeat CXR in AM  -will check bilateral upper and lower extremity Dopplers and showed no evidence of  DVT  Elevated Troponin with extensive cardiac history(history of PEA arrest, combined systolic/diastolicend stage heart failure), possibly demand ischemia in ESRD patient, stable -Troponin255->271 on admission -Asymptomatic with baseline LBBB on EKG -Cardiology consulted-no indication for cardiac intervention and recommended end-stage heart failure and recommending optimizing volume status with hemodialysis -Palliative care consulted, as above goals of care conversation had and he is now DNR -Telemetry -Poor prognosis, see Almyra Deforest, PA note from 12/10/19 for complete details  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 Eden Lathe on 11/10/2019, unfortunately this thrombosed and underwent thrombectomy by Dr. Carlis Abbott on 12/14. Eventually underwent a right SFA to common femoral vein AV  loop graft by Dr. Donzetta Matters 12/02/19 -Nephrology consulted: HD  yesterday -Further care per nephrology and they are planning to increase his treatment times given his debility and because of his increasing BUN/creatinine the plan for 3 and half hours next hemodialysis session -Patient had some bleeding from his HD catheter yesterday and I ordered 2 units PRBCs which were held by the nephrologist.  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 -Repeat CBC in AM continue to monitor carefully  Combined Systolic/Diastolic end-stage heart failure -Intolerant of beta-blockers in the past and other treatments -No further options per cardiology given his end-stage -Treat volume with HD if able and nephrology recommending increasing dialysis session times of 4 hours and dialyzing 4 times this week -I/O and daily weights; He is +3.352 Liters  -Palliative care consulted and now DNR  Chronic Hypotension -Continue Midodrine  PAF on Coumadin and amiodarone -Cw Coumadin per pharmacy and they are holding tonight's dose again and his INR is 4.3 -C/w Amiodarone 200 mg po Daily   Chronic pain  -C/w Tramadol 50 mg p.o. every 6 as needed  severe pain  History of HIV  -[name]  PreDiabetes -CBG's ranging 113-194 -C/w Insulin Aspart 0-9 Sensitive Scale q4h  Ambulatory Dysfunction -Uses cane and walker at baseline -PT/OT recommending SNF at discharge  Glaucoma -Continue with Brimonidine 0.15% ophthalmic solution 1 drop left eye twice daily, as well as dorzolamide-timolol 22.3-6.8 milligrams per milliliter 1 drop in the left eye twice daily, and latanoprost 1 drop in the left eye nightly  DVT prophylaxis: Anticoagulated with Coumadin Code Status: DO NOT RESUSCITATE  Family Communication: No family present at bedside  Disposition Plan: Pending Clinical Improvement and D/C to SNF; will continue nephrology dialysis treatments.  Per nursing patient's appetite was a little bit better today and tolerated all of his breakfast.  If he continues to have poor p.o. intake we will place a Cortrak and patient and wife are in agreement with this.  In the interim we will have nutritionist do a calorie count to see  if he is getting adequate nutrition now.  If Cortrak is placed then will try a week with it in and if patient improves his nutritional status he will be removed if not will be removed and patient will be sent to hospice house after discussion with the patient and the patient's family  Consultants:   Cardiology  Nephrology   Palliative Care   Procedures: VENOUS DUPLEX Upper and Lower  Upper showed No DVT and Lower showed No DVT   Antimicrobials: Anti-infectives (From admission, onward)   Start     Dose/Rate Route Frequency Ordered Stop   12/19/19 1500  vancomycin (VANCOREADY) IVPB 1500 mg/300 mL     1,500 mg 150 mL/hr over 120 Minutes Intravenous  Once 12/19/19 1446 12/19/19 1750   12/18/19 2000  bictegravir-emtricitabine-tenofovir AF ([name]) 50-200-25 MG per tablet 1 tablet     1 tablet Oral Daily 12/17/19 1514     12/13/19 1000  remdesivir 100 mg in sodium chloride 0.9 % 100 mL IVPB     100 mg 200 mL/hr over  30 Minutes Intravenous Every 24 hours 12/12/19 0724 12/16/19 1152   12/12/19 1000  bictegravir-emtricitabine-tenofovir AF ([name]) 50-200-25 MG per tablet 1 tablet  Status:  Discontinued     1 tablet Oral Daily 12/11/19 2024 12/17/19 1514   12/12/19 0800  remdesivir 200 mg in sodium chloride 0.9% 250 mL IVPB     200 mg 580 mL/hr over 30 Minutes Intravenous Once 12/12/19 0724 12/12/19 1253     Subjective: Seen and examined at bedside and he was getting dialysis and was appearing fatigued.  Denies any complaints but was a little sleepy.  No chest pain, nausea or vomiting currently.  No other concerns or complaints at this time and nursing states that he ate all his breakfast and half of his lunch.  Objective: Vitals:   12/22/19 1300 12/22/19 1330 12/22/19 1348 12/22/19 1400  BP: (!) 100/50 (!) 102/54 115/60 (!) 101/56  Pulse: 80 72 76 96  Resp: _0 Temp:   98.1 F (36.7 C) 98 F (36.7 C)  TempSrc:   Oral Oral  SpO2:   99% 96%  Weight:   80 kg   Height:        Intake/Output Summary (Last 24 hours) at 12/22/2019 1911 Last data filed at 12/22/2019 1800 Gross per 24 hour  Intake 587 ml  Output 1000 ml  Net -413 ml   Filed Weights   12/22/19 0500 12/22/19 0928 12/22/19 1348  Weight: 80.7 kg 81 kg 80 kg   Examination: Physical Exam:  Constitutional: Thin older appearing than his stated age African-American male currently in no acute distress but does appear fatigued getting dialysis, Eyes: Lids and conjunctivae normal, sclerae anicteric  ENMT: External Ears, Nose appear normal. Grossly normal hearing. Mucous membranes are moist.  Neck: Appears normal, supple, no cervical masses, normal ROM, no appreciable thyromegaly; no JVD Respiratory: Diminished to auscultation bilaterally, no wheezing, rales, rhonchi or crackles. Normal respiratory effort and patient is not tachypenic. No accessory muscle use.  Unlabored breathing is not wearing supplemental oxygen via nasal cannulae  skin dialysis Cardiovascular: RRR, no murmurs / rubs / gallops. S1 and S2 auscultated.  Abdomen: Soft, non-tender, non-distended. Bowel sounds positive x4.  GU: Deferred. Musculoskeletal: No clubbing / cyanosis of digits/nails. No joint deformity upper and lower extremities. Skin: No rashes, lesions, ulcers on a limited skin evaluation. No induration; Warm and dry.  Neurologic: CN 2-12 grossly intact with no focal deficits.  Romberg sign and cerebellar reflexes not assessed.  Psychiatric: Normal judgment and insight.  A little drowsy but he is oriented x 3. Slightly depressed appearing mood and appropriate affect.   Data Reviewed: I have personally reviewed following labs and imaging studies  CBC: Recent Labs  Lab 12/18/19 0448 12/18/19 0448 12/19/19 0346 12/19/19 1037 12/20/19 0741 12/21/19 0500 12/22/19 1152  WBC 7.1  --  4.7  --  5.3 7.3 12.3*  NEUTROABS 5.0  --  4.1  --  4.4 6.5 10.7*  HGB 10.6*   < > 10.0* 8.8* 8.0* 7.9* 8.7*  HCT 32.2*   < > 29.7* 26.8* 23.9* 23.9* 25.6*  MCV 105.6*  --  103.8*  --  104.4* 105.3* 102.4*  PLT 264  --  271  --  335 345 471*   < > = values in this interval not displayed.   Basic Metabolic Panel: Recent Labs  Lab 12/18/19 0448 12/19/19 0346 12/20/19 0741 12/21/19 0500 12/22/19 1152  NA 138 136 138 137 135  K 4.2 5.3* 4.8 5.2* 3.4*  CL 94* 92* 98 96* 92*  CO2 _0 GLUCOSE 103* 172* 146* 145* 119*  BUN 84* 110* 69* 102* 48*  CREATININE 9.78* 12.00* 7.98* 9.83* 4.59*  CALCIUM 8.6* 8.5* 8.4* 8.8* 8.5*  MG 2.2 2.4 2.3 2.5* 2.2  PHOS 8.5* 10.0* 9.7* 10.0* 3.9   GFR: Estimated Creatinine Clearance: 15.3 mL/min (A) (by C-G formula based on SCr of 4.59 mg/dL (H)). Liver Function Tests: Recent Labs  Lab 12/18/19 0448 12/19/19 0346 12/20/19 0741 12/21/19 0500 12/22/19 1152  AST _1 ALT _2 ALKPHOS 92 87 68 73 101  BILITOT 0.9 0.9 0.8 0.8 0.6  PROT 6.5 6.7 5.6* 5.6* 6.5  ALBUMIN 2.5* 2.4* 2.1*  2.4* 2.8*   No results for input(s): LIPASE, AMYLASE in the last 168 hours. No results for input(s): AMMONIA in the last 168 hours. Coagulation Profile: Recent Labs  Lab 12/18/19 0448 12/19/19 0346 12/20/19 0741 12/21/19 0924 12/22/19 0406  INR 3.6* 3.3* 4.3* 3.2* 2.9*   Cardiac Enzymes: No results for input(s): CKTOTAL, CKMB, CKMBINDEX, TROPONINI in the last 168 hours. BNP (last 3 results) No results for input(s): PROBNP in the last 8760 hours. HbA1C: No results for input(s): HGBA1C in the last 72 hours. CBG: Recent Labs  Lab 12/21/19 2357 12/22/19 0636 12/22/19 0803 12/22/19 1401 12/22/19 1702  GLUCAP 150* 194* 134* 152* 113*   Lipid Profile: No results for input(s): CHOL, HDL, LDLCALC, TRIG, CHOLHDL, LDLDIRECT in the last 72 hours. Thyroid Function Tests: No results for input(s): TSH, T4TOTAL, FREET4, T3FREE, THYROIDAB in the last 72 hours. Anemia Panel: Recent Labs    12/21/19 0500 12/22/19 0406  FERRITIN 765* 850*   Sepsis Labs: No results for input(s): PROCALCITON, LATICACIDVEN in the last 168 hours.  Recent Results (from the past 240 hour(s))  MRSA PCR Screening     Status: None   Collection Time: 12/12/19 10:35 PM   Specimen: Nasal Mucosa; Nasopharyngeal  Result Value Ref Range Status   MRSA by PCR NEGATIVE NEGATIVE Final    Comment:        The GeneXpert MRSA Assay (FDA approved for NASAL specimens only), is one component of a comprehensive MRSA colonization surveillance program. It is not intended to diagnose MRSA infection nor to guide or monitor treatment for MRSA infections. Performed at [facility], [place] [address] [place], [place] [address]  RN Pressure Injury Documentation:     Estimated body mass index is 22.64 kg/m as calculated from the following:   Height as of this encounter: 6' 2" (1.88 m).   Weight as of this encounter: 80 kg.  Malnutrition Type:  Nutrition Problem: Inadequate oral intake Etiology: poor  appetite, vomiting   Malnutrition Characteristics:  Signs/Symptoms: meal completion < 25%   Nutrition Interventions:  Interventions: Nepro shake, MVI, Prostat, Liberalize Diet   Radiology Studies: No results found. Scheduled Meds: . sodium chloride   Intravenous Once  . allopurinol  100 mg Oral Once per day on Mon Wed Fri  . amiodarone  200 mg Oral Daily  . vitamin C  500 mg Oral Daily  . bictegravir-emtricitabine-tenofovir AF  1 tablet Oral Daily  . brimonidine  1 drop Left Eye BID  . calcitRIOL  0.5 mcg Oral Q M,W,F-HD  . Chlorhexidine Gluconate Cloth  6 each Topical Q0600  . dexamethasone (DECADRON) injection  6 mg Intravenous Q24H  . dorzolamide-timolol  1 drop Left Eye BID  . famotidine  10 mg Oral Daily  . feeding supplement (NEPRO CARB STEADY)  237 mL Oral TID BM  . feeding supplement (PRO-STAT SUGAR FREE 64)  30 mL Oral BID  . insulin aspart  0-9 Units Subcutaneous Q4H  . latanoprost  1 drop Left Eye QHS  . midodrine  10 mg Oral Q M,W,F-HD  . montelukast  10 mg Oral QHS  . multivitamin  1 tablet Oral QHS  . sucroferric oxyhydroxide  1,000 mg Oral TID WC  . Warfarin - Pharmacist Dosing Inpatient   Does not apply q1800  . zinc sulfate  220 mg Oral Daily   Continuous Infusions:   LOS: 11 days   Kerney Elbe, DO [facility] PAGER is on AMION  If 7PM-7AM, please contact night-coverage [email or web address]

## 2019-12-22 NOTE — Consult Note (Signed)
   [facility] CM Inpatient Consult   12/22/2019  [facility] Sep 03, 1941 [id number]   Continue to follow for progress and disposition.  Reviewed palliative care notes.    Plan: Follow progress and needs as appropriate with transition of care last PT notes are recommending a skilled nursing facility stay at disposition.    Plan:  Follow for transitional needs, if appropriate, [employer].  For questions, please contact:   Natividad Brood, RN BSN [facility] Liaison  [phone] business mobile phone Toll free office [phone]  Fax number: [phone] [email or web address]

## 2019-12-22 NOTE — Plan of Care (Signed)
  Problem: Education: Goal: Knowledge of General Education information will improve Description: Including pain rating scale, medication(s)/side effects and non-pharmacologic comfort measures Outcome: Not Progressing   Problem: Health Behavior/Discharge Planning: Goal: Ability to manage health-related needs will improve Outcome: Not Progressing   Problem: Clinical Measurements: Goal: Ability to maintain clinical measurements within normal limits will improve Outcome: Not Progressing Goal: Will remain free from infection Outcome: Not Progressing Goal: Diagnostic test results will improve Outcome: Not Progressing Goal: Respiratory complications will improve Outcome: Not Progressing Goal: Cardiovascular complication will be avoided Outcome: Not Progressing   Problem: Activity: Goal: Risk for activity intolerance will decrease Outcome: Not Progressing   Problem: Nutrition: Goal: Adequate nutrition will be maintained Outcome: Not Progressing   Problem: Coping: Goal: Level of anxiety will decrease Outcome: Not Progressing   Problem: Education: Goal: Knowledge of risk factors and measures for prevention of condition will improve Outcome: Not Progressing   Problem: Coping: Goal: Psychosocial and spiritual needs will be supported Outcome: Not Progressing   Problem: Respiratory: Goal: Will maintain a patent airway Outcome: Not Progressing Goal: Complications related to the disease process, condition or treatment will be avoided or minimized Outcome: Not Progressing

## 2019-12-22 NOTE — Progress Notes (Signed)
PT Cancellation Note  Patient Details Name: David [name] MRN: [id number] DOB: 10-31-1941   Cancelled Treatment:    Reason Eval/Treat Not Completed: Patient at procedure or test/unavailable. Pt in HD.   Lorriane Shire 12/22/2019, 11:57 AM   Lorrin Goodell, PT  Office # [phone] Pager [phone]

## 2019-12-22 NOTE — Progress Notes (Addendum)
[facility] Progress Note   OP HD:  [facility]  (MWF here)  4h  400/1.5  83.5kg  2/2 bath P4  AVG thigh  Hep 2000 - Mircera 45mcg IV q 2 weeks (last 12/06/19) - Calcitriol 0.78mcg PO q HD Assessment/ Plan:   1)End-stage renal disease -outpt HD is MTT[facility] schedule. Having nausea/ taste issues, presumably due to uremia. 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 Hb dropped to 7.9. Will give 1 unit prbc's w HD tomorrow given pt's weakness.   8)Secondary hyperparathyroidism -Will continue calcitriol inpatient and will be administered at his dialysis unit upon discharge. renal diet. On velphoro -> incr to 1gm TIDM  9) H/o CVA   10) HIV - on meds  11) Atrial fibrillation - appears chronic  Subjective:   Feels that his breathing is much better. Denies f/ c/n/v.   Objective:   BP (!) 125/58   Pulse 71   Temp (!) 97.4 F (36.3 C) (Oral)   Resp 15   Ht 6\' 2"  (1.88 m)   Wt 80.7 kg   SpO2 96%   BMI 22.84 kg/m   Intake/Output Summary (Last 24 hours) at 12/22/2019 0941 Last data filed at 12/22/2019 0800 Gross per 24 hour  Intake 1167 ml  Output -  Net 1167 ml   Weight change:  0.2 kg  Physical Exam: GEN: NAD, A&Ox3, NCAT HEENT: No conjunctival pallor, EOMI NECK: Supple, no thyromegaly LUNGS: CTA B/L no rales, rhonchi or wheezing CV: irreg irreg ABD: SNDNT +BS  EXT: No lower extremity edema ACCESS: rt thigh AVG +bruit    Imaging: No results found.  Labs: BMET Recent Labs  Lab 12/16/19 0500 12/17/19 0500 12/18/19 0448 12/19/19 0346 12/20/19 0741 12/21/19 0500  NA 138 139 138 136 138 137  K 5.0 4.5 4.2 5.3* 4.8 5.2*  CL 95* 94* 94* 92* 98 96*  CO2 25 23 26 23 24 22   GLUCOSE 91 69* 103* 172* 146* 145*  BUN 72* 108* 84* 110* 69* 102*  CREATININE 8.63* 10.98* 9.78* 12.00* 7.98* 9.83*  CALCIUM 8.9 8.6* 8.6* 8.5* 8.4* 8.8*  PHOS 7.3* 9.8* 8.5* 10.0* 9.7* 10.0*   CBC Recent Labs  Lab 12/18/19 0448 12/18/19 0448 12/19/19 0346 12/19/19 1037 12/20/19 0741 12/21/19 0500  WBC 7.1  --  4.7  --  5.3 7.3  NEUTROABS 5.0  --  4.1  --  4.4 6.5  HGB 10.6*   < > 10.0* 8.8* 8.0* 7.9*  HCT 32.2*   < > 29.7* 26.8* 23.9* 23.9*  MCV 105.6*  --  103.8*  --  104.4* 105.3*  PLT 264  --  271  --  335 345   < > = values in this interval not displayed.    Medications:    . heparin      . sodium chloride   Intravenous Once  . allopurinol  100 mg Oral Once per day on Mon Wed Fri  . amiodarone  200 mg Oral Daily  . vitamin C  500 mg Oral Daily  . bictegravir-emtricitabine-tenofovir AF  1 tablet Oral Daily  . brimonidine  1 drop Left Eye BID  . calcitRIOL  0.5 mcg Oral Q M,W,F-HD  . Chlorhexidine Gluconate Cloth  6 each Topical Q0600  . dexamethasone (DECADRON) injection  6 mg Intravenous Q24H  . dorzolamide-timolol  1 drop Left Eye BID  . famotidine  10 mg Oral Daily  . feeding supplement (NEPRO CARB STEADY)  237 mL Oral TID BM  . feeding supplement (PRO-STAT SUGAR FREE 64)  30 mL Oral BID  . insulin aspart  0-9 Units Subcutaneous Q4H  . latanoprost  1 drop Left Eye QHS  . midodrine  10 mg Oral Q M,W,F-HD  . montelukast  10 mg Oral QHS  .  multivitamin  1 tablet Oral QHS  . sucroferric oxyhydroxide  500 mg Oral TID WC  . Warfarin - Pharmacist Dosing Inpatient   Does not apply q1800  . zinc sulfate  220 mg Oral Daily      Otelia Santee, MD 12/22/2019, 9:41 AM

## 2019-12-23 ENCOUNTER — Inpatient Hospital Stay (HOSPITAL_COMMUNITY): Payer: Medicare HMO

## 2019-12-23 DIAGNOSIS — Z7189 Other specified counseling: Secondary | ICD-10-CM

## 2019-12-23 LAB — MAGNESIUM: Magnesium: 2.3 mg/dL (ref 1.7–2.4)

## 2019-12-23 LAB — COMPREHENSIVE METABOLIC PANEL
ALT: 26 U/L (ref 0–44)
AST: 21 U/L (ref 15–41)
Albumin: 2.4 g/dL — ABNORMAL LOW (ref 3.5–5.0)
Alkaline Phosphatase: 87 U/L (ref 38–126)
Anion gap: 17 — ABNORMAL HIGH (ref 5–15)
BUN: 68 mg/dL — ABNORMAL HIGH (ref 8–23)
CO2: 25 mmol/L (ref 22–32)
Calcium: 8.5 mg/dL — ABNORMAL LOW (ref 8.9–10.3)
Chloride: 95 mmol/L — ABNORMAL LOW (ref 98–111)
Creatinine, Ser: 7.2 mg/dL — ABNORMAL HIGH (ref 0.61–1.24)
GFR calc Af Amer: 8 mL/min — ABNORMAL LOW (ref >60)
GFR calc non Af Amer: 7 mL/min — ABNORMAL LOW (ref >60)
Glucose, Bld: 199 mg/dL — ABNORMAL HIGH (ref 70–99)
Potassium: 4.6 mmol/L (ref 3.5–5.1)
Sodium: 137 mmol/L (ref 135–145)
Total Bilirubin: 0.3 mg/dL (ref 0.3–1.2)
Total Protein: 5.5 g/dL — ABNORMAL LOW (ref 6.5–8.1)

## 2019-12-23 LAB — TYPE AND SCREEN
ABO/RH(D): O POS
Antibody Screen: NEGATIVE
Unit division: 0
Unit division: 0

## 2019-12-23 LAB — CBC WITH DIFFERENTIAL/PLATELET
Abs Immature Granulocytes: 0.06 10*3/uL (ref 0.00–0.07)
Basophils Absolute: 0 10*3/uL (ref 0.0–0.1)
Basophils Relative: 0 %
Eosinophils Absolute: 0 10*3/uL (ref 0.0–0.5)
Eosinophils Relative: 0 %
HCT: 22.8 % — ABNORMAL LOW (ref 39.0–52.0)
Hemoglobin: 7.5 g/dL — ABNORMAL LOW (ref 13.0–17.0)
Immature Granulocytes: 1 %
Lymphocytes Relative: 4 %
Lymphs Abs: 0.4 10*3/uL — ABNORMAL LOW (ref 0.7–4.0)
MCH: 34.7 pg — ABNORMAL HIGH (ref 26.0–34.0)
MCHC: 32.9 g/dL (ref 30.0–36.0)
MCV: 105.6 fL — ABNORMAL HIGH (ref 80.0–100.0)
Monocytes Absolute: 0.2 10*3/uL (ref 0.1–1.0)
Monocytes Relative: 3 %
Neutro Abs: 8.6 10*3/uL — ABNORMAL HIGH (ref 1.7–7.7)
Neutrophils Relative %: 92 %
Platelets: 328 10*3/uL (ref 150–400)
RBC: 2.16 MIL/uL — ABNORMAL LOW (ref 4.22–5.81)
RDW: 13.9 % (ref 11.5–15.5)
WBC: 9.3 10*3/uL (ref 4.0–10.5)
nRBC: 0 % (ref 0.0–0.2)

## 2019-12-23 LAB — PROTIME-INR
INR: 1.9 — ABNORMAL HIGH (ref 0.8–1.2)
Prothrombin Time: 21.5 seconds — ABNORMAL HIGH (ref 11.4–15.2)

## 2019-12-23 LAB — GLUCOSE, CAPILLARY
Glucose-Capillary: 166 mg/dL — ABNORMAL HIGH (ref 70–99)
Glucose-Capillary: 169 mg/dL — ABNORMAL HIGH (ref 70–99)
Glucose-Capillary: 178 mg/dL — ABNORMAL HIGH (ref 70–99)
Glucose-Capillary: 192 mg/dL — ABNORMAL HIGH (ref 70–99)
Glucose-Capillary: 228 mg/dL — ABNORMAL HIGH (ref 70–99)
Glucose-Capillary: 291 mg/dL — ABNORMAL HIGH (ref 70–99)

## 2019-12-23 LAB — BPAM RBC
Blood Product Expiration Date: 202102122359
Blood Product Expiration Date: 202102122359
Unit Type and Rh: 5100
Unit Type and Rh: 5100

## 2019-12-23 LAB — PHOSPHORUS: Phosphorus: 6.7 mg/dL — ABNORMAL HIGH (ref 2.5–4.6)

## 2019-12-23 MED ORDER — ENSURE ENLIVE PO LIQD
237.0000 mL | Freq: Three times a day (TID) | ORAL | Status: DC
  Administered 2019-12-23 – 2020-01-06 (×33): 237 mL via ORAL
  Filled 2019-12-23 (×2): qty 237

## 2019-12-23 MED ORDER — WARFARIN SODIUM 5 MG PO TABS
5.0000 mg | ORAL_TABLET | Freq: Once | ORAL | Status: AC
  Administered 2019-12-23: 5 mg via ORAL
  Filled 2019-12-23: qty 1

## 2019-12-23 NOTE — Progress Notes (Signed)
ANTICOAGULATION CONSULT NOTE - Follow up  Pharmacy Consult for warfarin Indication: atrial fibrillation  (CHADS2VASc = 8)   Patient Measurements: Height: 6\' 2"  (188 cm) Weight: 176 lb 12.9 oz (80.2 kg) IBW/kg (Calculated) : 82.2  Vital Signs: Temp: 97.6 F (36.4 C) (01/19 1215) Temp Source: Oral (01/19 1215) BP: 117/64 (01/19 1215) Pulse Rate: 75 (01/19 1215)  Labs: Recent Labs    12/21/19 0500 12/21/19 0500 12/21/19 0924 12/22/19 0406 12/22/19 1152 12/23/19 0414  HGB 7.9*   < >  --   --  8.7* 7.5*  HCT 23.9*  --   --   --  25.6* 22.8*  PLT 345  --   --   --  471* 328  LABPROT  --   --  32.7* 30.4*  --  21.5*  INR  --   --  3.2* 2.9*  --  1.9*  CREATININE 9.83*  --   --   --  4.59* 7.20*   < > = values in this interval not displayed.    Estimated Creatinine Clearance: 9.7 mL/min (A) (by C-G formula based on SCr of 7.2 mg/dL (H)).   Assessment: 79 yo M on warfarin for Afib (CHADS2VASc = 8) admitted 12/11/19 for COVID and elevated D-Dimer > 5 on admission. Of note, pt had INR 1.2 on 1/6 and coumadin clinic instructed him to take 10mg  that day then resume previous dose.   INR dropped to 1.9 (a whole point from yesterday) after holding warfarin for 6 days then a low dose of 2 mg yesterday due to previous supratherapeutic INRs. Bleeding episode was reported in HD on 1/15. After episode, patient received 2 pRBC. H/H has remained low - Hgb 7.5. Platelets are stable.  Patient has had nausea and poor appetite - now improving with 50-75% of meal intake documented.   PTA warfarin dose 2.5mg  on Mon + Fri and 5mg  all other days  Goal of Therapy:  INR 2-3 Monitor platelets by anticoagulation protocol: Yes   Plan:  Warfarin 5mg  po x1 today.  Daily INR, monitor for s/sx of bleeding   Sloan Leiter, PharmD, BCPS, BCCCP Clinical Pharmacist Please refer to [facility] for [place] numbers 12/23/2019, 2:25 PM

## 2019-12-23 NOTE — Progress Notes (Signed)
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    Initially not started on any Covid treatment as he was hemodynamically stable on room air however had increasing respiratory rate and CRP 22.9 on 1/8 and so was started on dexamethasone and remdesivir.  Actemra on hold due to HIV status.    Methasone was held but will now be reinitiated.  Cardiology consulted for elevated troponin but not deemed a candidate for cardiac intervention, likely demand ischemia and no therapies recommended.  Nephrology was consulted due to ESRD and palliative care team consulted and patient is now a DNR. He underwent Dialysis yesterday and has had low BP's.   Patient is having inadequate p.o. intake and with his recent significant weight loss and increased kilocalorie needs and protein needs the dietitian is recommending placement of core track tube placement as well as starting tube feedings.  Will need to discuss with the wife and have a goals of care conversation with palliative again involved.  **1/15 patient went to the dialysis unit and ended up having a rapid response called after he had not being properly hooked up and had excessive bleeding around his HD site.  He had a pool of blood found along him near his HD connection site and he felt lightheaded.  And bleeding had stopped.  Rapid response nurse to give the patient a 250 mL bolus and I ordered 2 units of PRBCs and  nephrology decided to continue HD on the patient and transfuse as needed and stopped the blood transfusion.  His warfarin was held  1/16: Warfarin continues to be held but his INR continues to trend up.  Palliative care patient's poor p.o. intake with the patient and the patient's wife and patient gets nauseous after dialysis.  Discussions about a core track tube were had and patient's wife wants to wait until Monday to reassess to see if he is eating any better.  Continues to have issues with altered taste though and only is eating a small amount of time  1/17: He states that he is eating a little bit better but this is only his boost nutrition.  Nephrology planning to increase dialysis times as well as amount of dialysis sessions this week.  1/18: Seen and examined during dialysis and he states that he is doing okay but was wanting to sleep someone.  Not as nauseous but refusing his VELPHORO because it causes him nausea.  Patient did undergo hemodialysis today but will go Monday Wednesday Friday per Dr. Aron Baba note given hospital census and if patient not improving will go on Saturday then as well.  Appetite per nursing has been a little bit better but palliative discussed about Cortrak tube feeding in case is necessary and patient is agreeable.  We will order a calorie count and continue monitor patient's oral intake and if necessary will place a core track as palliative end-of-life discussed this as well as the patient and they will try a trial of Cortrak for 1 week and if not  improving will go to [facility].  1/19: Calorie count has now started to assess patient's nutritional status and see if he requires a Cortrak feeding or not.  Nursing states that he has been doing pretty good with his meals but is just a slow eater.  To be dialyzed tomorrow.  PT OT still recommending skilled nursing facility.  Assessment & Plan:   Active Problems:   COVID-19   Combined systolic and diastolic heart failure  ([place])   ESRD on dialysis ([place])   DNR (do not resuscitate)  SIRS/Failure to thrive, secondary to COVID-19, SIRS resolved -On admission: Febrile, lactic acidosis. increased respiratory rate to >30, vitals. Now on room air   -CRP peaked at 24, currently downtrending, did not get actemra due to HIV status -Completed 4 days dexamethasone, stopped 1/11 due to uremia (BUN 99) and tolerating room air but this was resumed 1/14 as inflammatory markers started trending upwards -Day 5/5 Remdesivir and now Completed -Inflammatory Marker Trend and CRP is still trending upward - Recent Labs    12/21/19 0500 12/21/19 0924 12/22/19 0406  DDIMER  --  2.67* 2.60*  FERRITIN 765*  --  850*  LDH 164  --  168  CRP 5.6*  --  4.1*  -ESR was 75 and worsened to 100 so we will need to initiate steroids again this was done and since initiation of steroids his CRP has trended downwards and his ferritin is also improved ESR is now 80 and trending further down to 76 is now bumped up to 85 but was not repeated today  -We will continue to monitor and repeat inflammatory markers in a.m. Lab Results  Component Value Date   SARSCOV2NAA NEGATIVE 11/29/2019   [place] NEGATIVE 11/17/2019   SARSCOV2NAA NOT DETECTED 11/06/2019   [place] NEGATIVE 11/01/2019  -SpO2: 94 % -Palliative care consulted -patient made DNR on 1/11 after palliative discussion with wife; palliative continues to follow and having discussions about possible feeding tube and Cortrak this will happen on Monday, 12/21/2018; patient is agreeable for Cortrak and wife is agreeable as well and will try this short-term for 1 week however since he is eating little bit better will order calorie count first to see if he really needs it and have nutritionist reevaluate; today's Calorie Count Day 1 -Continue with Tylenol for fever -C/w Antitussives with Guaifenesin-Dextromethorphan 10 mL po q4hprn Cough and Chlorpheniramine-Hydrocodone 5 mL po q12hprn -C/w Zinc  220 mg po Daily and Vitamin C  -Airborne and Contact Precautions -C/w Combivent Scheduled 1 puff IH q6h and Albuterol 1-2 puff IH q4hprn -Add Flutter Valve and Incentive Spirometry  -Patient has had inadequate p.o. intake so dietitian is recommending Cortrack placement as well as tube feedings but will order calorie count now nursing reports that he is eating better -Continue to Monitor and Trend and check CXR in AM   Nausea/Vomiting, possible uremia versus unknown medication induced, improving -Continue Zofran -Added pepcid -tends to have taste disturbances after hemodialysis -continues to have inadequate p.o. intake so we will need to discuss with wife and palliative care goals of care conversation about tube feeding; will need to figure out how much the patient is actually eating; wife wants to wait until Monday to reevaluate -Continues to be nauseous after dialysis and has altered taste; nephrology believes that this is secondary to his uremia given his shortened dialysis sessions and they are planning to increase regular times to 4 hours and do hemodialysis 4 times this week which should resolve his nausea;  unfortunately Dr. Augustin Coupe feels that 4 times week hemodialysis may be difficult given the hospital since his with ESRD.  Currently he has been written for Monday Wednesday Friday and if necessary he will go on Saturday  Elevated D-Dimer -Trending down now and last checked was 2.60 -Tolerating room air; Repeat CXR in AM  -will check bilateral upper and lower extremity Dopplers and showed no evidence of DVT  Elevated Troponin with extensive cardiac history(history of PEA arrest, combined systolic/diastolicend stage heart failure), possibly demand ischemia in ESRD patient, stable -[id number]->271 on admission -Asymptomatic with baseline LBBB on EKG -Cardiology consulted-no indication for cardiac intervention and recommended end-stage heart failure and recommending optimizing volume status  with hemodialysis -Palliative care consulted, as above goals of care conversation had and he is now DNR -Telemetry -Poor prognosis, see Almyra Deforest, PA note from 12/10/19 for complete details  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 Eden Lathe on 11/10/2019, unfortunately this thrombosed and underwent thrombectomy by Dr. Carlis Abbott on 12/14. Eventually underwent a right SFA to common femoral vein AV loop graft by Dr. Donzetta Matters 12/02/19 -Nephrology consulted: HD  yesterday -Further care per nephrology and they are planning to increase his treatment times given his debility and because of his increasing BUN/creatinine the plan for 3 and half hours next hemodialysis session -Patient had some bleeding from his HD catheter yesterday and I ordered 2 units PRBCs which were held by the nephrologist.  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, macrocytic anemia -On ESA, last dose 1/2 -Patient's Hb/Hct was slightly dropping and then dropped further when he had bleeding from his HD catheter site and went from 10.0/29.7 and started dropping but today is 7.5/22.8 and dropped further from yesterday -Continue to Monitor for S/Sx of Bleeding and bleeding seems to have stopped from his temporary dialysis catheter -Repeat CBC in AM continue to monitor carefully  Combined Systolic/Diastolic end-stage heart failure -Intolerant of beta-blockers in the past and other treatments -No further options per cardiology given his end-stage -Treat volume with HD  if able and nephrology recommending increasing dialysis session times of 4 hours and dialyzing 4 times this week -I/O and daily weights; He is +4.417 liters but 3 kg under his dry weight -Palliative care consulted and now DNR  Chronic Hypotension -Continue Midodrine  PAF on Coumadin and amiodarone -Cw Coumadin per pharmacy and his INR is improving from 4.3 and is now 1.9 -C/w Amiodarone 200 mg po Daily   Chronic pain  -C/w Tramadol 50 mg p.o. every 6 as needed severe pain  History of HIV  -[name]  PreDiabetes -CBG's ranging 152-192 -C/w Insulin Aspart 0-9 Sensitive Scale q4h  Ambulatory Dysfunction -Uses cane and walker at baseline -PT/OT recommending SNF at discharge will need to make sure that he can adequately take p.o. nutrition  Glaucoma -Continue with Brimonidine 0.15% ophthalmic solution 1 drop left eye twice daily, as well as dorzolamide-timolol 22.3-6.8 milligrams per milliliter 1 drop in the left eye twice daily, and latanoprost 1 drop in the left  eye nightly  DVT prophylaxis: Anticoagulated with Coumadin Code Status: DO NOT RESUSCITATE  Family Communication: No family present at bedside  Disposition Plan: Pending Clinical Improvement and D/C to SNF; will continue nephrology dialysis treatments.  Per nursing patient's appetite was a little bit better today and tolerated all of his breakfast.  If he continues to have poor p.o. intake we will place a Cortrak and patient and wife are in agreement with this.  In the interim we will have nutritionist do a calorie count to see if he is getting adequate nutrition now.  If Cortrak is placed then will try a week with it in and if patient improves his nutritional status he will be removed if not will be removed and patient will be sent to hospice house after discussion with the patient and the patient's family  Consultants:   Cardiology  Nephrology   Palliative Care   Procedures: VENOUS DUPLEX Upper and Lower  Upper  showed No DVT and Lower showed No DVT   Antimicrobials: Anti-infectives (From admission, onward)   Start     Dose/Rate Route Frequency Ordered Stop   12/19/19 1500  vancomycin (VANCOREADY) IVPB 1500 mg/300 mL     1,500 mg 150 mL/hr over 120 Minutes Intravenous  Once 12/19/19 1446 12/19/19 1750   12/18/19 2000  bictegravir-emtricitabine-tenofovir AF ([name]) 50-200-25 MG per tablet 1 tablet     1 tablet Oral Daily 12/17/19 1514     12/13/19 1000  remdesivir 100 mg in sodium chloride 0.9 % 100 mL IVPB     100 mg 200 mL/hr over 30 Minutes Intravenous Every 24 hours 12/12/19 0724 12/16/19 1152   12/12/19 1000  bictegravir-emtricitabine-tenofovir AF ([name]) 50-200-25 MG per tablet 1 tablet  Status:  Discontinued     1 tablet Oral Daily 12/11/19 2024 12/17/19 1514   12/12/19 0800  remdesivir 200 mg in sodium chloride 0.9% 250 mL IVPB     200 mg 580 mL/hr over 30 Minutes Intravenous Once 12/12/19 0724 12/12/19 1253     Subjective: Seen and examined at bedside and is resting at bedside.  States that he is doing okay with his eating.  Denies any chest pain, lightheadedness or dizziness.  Nursing states that he eats all his food but takes a little while to eat.  Continues to refuse his VELPHORO.  No other concerns or complaints at this time is to be dialyzed again tomorrow.  Objective: Vitals:   12/23/19 0439 12/23/19 0700 12/23/19 0800 12/23/19 1215  BP: 102/61  113/64 117/64  Pulse: 71  70 75  Resp: 16 16 16 16   Temp: 97.8 F (36.6 C)   97.6 F (36.4 C)  TempSrc: Oral   Oral  SpO2: 97%  97% 94%  Weight: 80.2 kg     Height:        Intake/Output Summary (Last 24 hours) at 12/23/2019 1700 Last data filed at 12/23/2019 1400 Gross per 24 hour  Intake 1685 ml  Output -  Net 1685 ml   Filed Weights   12/22/19 0928 12/22/19 1348 12/23/19 0439  Weight: 81 kg 80 kg 80.2 kg   Examination: Physical Exam:  Constitutional: Thin older appearing than his stated age AAM in NAD and  appears calm Eyes: Lids and conjunctivae normal, sclerae anicteric  ENMT: External Ears, Nose appear normal. Grossly normal hearing. Mucous membranes are moist.  Neck: Appears normal, supple, no cervical masses, normal ROM, no appreciable thyromegaly; no JVD Respiratory: Diminished to auscultation bilaterally, no wheezing, rales, rhonchi  or crackles. Normal respiratory effort and patient is not tachypenic. No accessory muscle use.  Unlabored breathing is not wearing supplemental oxygen via nasal cannula Cardiovascular: RRR, no murmurs / rubs / gallops. S1 and S2 auscultated. No extremity edema.  Abdomen: Soft, non-tender, non-distended. Bowel sounds positive.  GU: Deferred. Musculoskeletal: No clubbing / cyanosis of digits/nails. No joint deformity upper and lower extremities.  Skin: No rashes, lesions, ulcers on limited skin evaluation. No induration; Warm and dry.  Neurologic: CN 2-12 grossly intact with no focal deficits. Romberg sign and cerebellar reflexes not assessed.  Psychiatric: Normal judgment and insight.  Appears a little sleepy but is then alert and oriented x 3. Normal mood and appropriate affect.   Data Reviewed: I have personally reviewed following labs and imaging studies  CBC: Recent Labs  Lab 12/19/19 0346 12/19/19 0346 12/19/19 1037 12/20/19 0741 12/21/19 0500 12/22/19 1152 12/23/19 0414  WBC 4.7  --   --  5.3 7.3 12.3* 9.3  NEUTROABS 4.1  --   --  4.4 6.5 10.7* 8.6*  HGB 10.0*   < > 8.8* 8.0* 7.9* 8.7* 7.5*  HCT 29.7*   < > 26.8* 23.9* 23.9* 25.6* 22.8*  MCV 103.8*  --   --  104.4* 105.3* 102.4* 105.6*  PLT 271  --   --  335 345 471* 328   < > = values in this interval not displayed.   Basic Metabolic Panel: Recent Labs  Lab 12/19/19 0346 12/20/19 0741 12/21/19 0500 12/22/19 1152 12/23/19 0414  NA 136 138 137 135 137  K 5.3* 4.8 5.2* 3.4* 4.6  CL 92* 98 96* 92* 95*  CO2 23 24 22 26 25   GLUCOSE 172* 146* 145* 119* 199*  BUN 110* 69* 102* 48* 68*   CREATININE 12.00* 7.98* 9.83* 4.59* 7.20*  CALCIUM 8.5* 8.4* 8.8* 8.5* 8.5*  MG 2.4 2.3 2.5* 2.2 2.3  PHOS 10.0* 9.7* 10.0* 3.9 6.7*   GFR: Estimated Creatinine Clearance: 9.7 mL/min (A) (by C-G formula based on SCr of 7.2 mg/dL (H)). Liver Function Tests: Recent Labs  Lab 12/19/19 0346 12/20/19 0741 12/21/19 0500 12/22/19 1152 12/23/19 0414  AST 27 18 20 20 21   ALT 26 19 25 27 26   ALKPHOS 87 68 73 101 87  BILITOT 0.9 0.8 0.8 0.6 0.3  PROT 6.7 5.6* 5.6* 6.5 5.5*  ALBUMIN 2.4* 2.1* 2.4* 2.8* 2.4*   No results for input(s): LIPASE, AMYLASE in the last 168 hours. No results for input(s): AMMONIA in the last 168 hours. Coagulation Profile: Recent Labs  Lab 12/19/19 0346 12/20/19 0741 12/21/19 0924 12/22/19 0406 12/23/19 0414  INR 3.3* 4.3* 3.2* 2.9* 1.9*   Cardiac Enzymes: No results for input(s): CKTOTAL, CKMB, CKMBINDEX, TROPONINI in the last 168 hours. BNP (last 3 results) No results for input(s): PROBNP in the last 8760 hours. HbA1C: No results for input(s): HGBA1C in the last 72 hours. CBG: Recent Labs  Lab 12/23/19 0028 12/23/19 0437 12/23/19 0751 12/23/19 1140 12/23/19 1656  GLUCAP 291* 166* 169* 192* 178*   Lipid Profile: No results for input(s): CHOL, HDL, LDLCALC, TRIG, CHOLHDL, LDLDIRECT in the last 72 hours. Thyroid Function Tests: No results for input(s): TSH, T4TOTAL, FREET4, T3FREE, THYROIDAB in the last 72 hours. Anemia Panel: Recent Labs    12/21/19 0500 12/22/19 0406  FERRITIN 765* 850*   Sepsis Labs: No results for input(s): PROCALCITON, LATICACIDVEN in the last 168 hours.  No results found for this or any previous visit (from the past 240 hour(s)).  RN Pressure Injury Documentation:     Estimated body mass index is 22.7 kg/m as calculated from the following:   Height as of this encounter: 6' 2"  (1.88 m).   Weight as of this encounter: 80.2 kg.  Malnutrition Type:  Nutrition Problem: Inadequate oral intake Etiology: poor  appetite, vomiting   Malnutrition Characteristics:  Signs/Symptoms: meal completion < 25%   Nutrition Interventions:  Interventions: Nepro shake, MVI, Prostat, Liberalize Diet   Radiology Studies: DG CHEST PORT 1 VIEW  Result Date: 12/23/2019 CLINICAL DATA:  Tremors, shortness of breath EXAM: PORTABLE CHEST 1 VIEW COMPARISON:  12/18/2019 FINDINGS: Improving bilateral lower lung hazy airspace disease. No pleural effusion or pneumothorax. Stable cardiomegaly. Large bore right jugular central venous catheter with the tip projecting over the cavoatrial junction. No acute osseous abnormality. IMPRESSION: Stable cardiomegaly. Improving bilateral lower lung hazy airspace disease. Electronically Signed   By: Kathreen Devoid   On: 12/23/2019 08:58   Scheduled Meds: . sodium chloride   Intravenous Once  . allopurinol  100 mg Oral Once per day on Mon Wed Fri  . amiodarone  200 mg Oral Daily  . vitamin C  500 mg Oral Daily  . bictegravir-emtricitabine-tenofovir AF  1 tablet Oral Daily  . brimonidine  1 drop Left Eye BID  . calcitRIOL  0.5 mcg Oral Q M,W,F-HD  . Chlorhexidine Gluconate Cloth  6 each Topical Q0600  . dexamethasone (DECADRON) injection  6 mg Intravenous Q24H  . dorzolamide-timolol  1 drop Left Eye BID  . famotidine  10 mg Oral Daily  . feeding supplement (ENSURE ENLIVE)  237 mL Oral TID BM  . feeding supplement (PRO-STAT SUGAR FREE 64)  30 mL Oral BID  . insulin aspart  0-9 Units Subcutaneous Q4H  . latanoprost  1 drop Left Eye QHS  . midodrine  10 mg Oral Q M,W,F-HD  . montelukast  10 mg Oral QHS  . multivitamin  1 tablet Oral QHS  . sucroferric oxyhydroxide  1,000 mg Oral TID WC  . warfarin  5 mg Oral ONCE-1800  . Warfarin - Pharmacist Dosing Inpatient   Does not apply q1800  . zinc sulfate  220 mg Oral Daily   Continuous Infusions:   LOS: 12 days   Kerney Elbe, DO [facility] PAGER is on AMION  If 7PM-7AM, please contact night-coverage [email or web address]

## 2019-12-23 NOTE — Progress Notes (Signed)
Occupational Therapy Treatment Patient Details Name: David [name] MRN: [id number] DOB: Nov 20, 1941 Today's Date: 12/23/2019    History of present illness Patient is a 79 year old male with history of combined systolic and diastolic heart failure (EF 20 to 25% 03/04/2019), iron deficiency anemia, atrial fibrillation on Coumadin only tolerates amiodarone, PEA arrest 02/2018, ESRD on HD MTTS, GERD, HIV, prostate cancer, CVA, hypertension who presented to the ED on 1/7 with complaints of generalized weakness and tremors and not feeling well x5 days.  Found to be Covid +.   OT comments  Pt with improved functional mobility today - requiring mod a x2 to move from sit to stand at the sink multiple times.  Pt verbalizing desire to get up and move to get stronger.  Stedy to return to bed due to fatigue (pt had been sitting up in recliner chair x approximately  1 hour before therapy session).  Pt progressing toward OT goals.     Follow Up Recommendations  SNF;Supervision/Assistance - 24 hour    Equipment Recommendations  None recommended by OT    Recommendations for Other Services      Precautions / Restrictions Precautions Precautions: Fall Restrictions Weight Bearing Restrictions: No       Mobility Bed Mobility Overal bed mobility: Needs Assistance Bed Mobility: Sit to Sidelying;Rolling Rolling: Min assist       Sit to sidelying: +2 for physical assistance;Min assist;+2 for safety/equipment General bed mobility comments: slow processing requiring incr cues  Transfers Overall transfer level: Needs assistance Equipment used: Ambulation equipment used Transfers: Sit to/from Stand Sit to Stand: +2 physical assistance;Mod assist;+2 safety/equipment Stand pivot transfers: Mod assist       General transfer comment: stood from recliner/BSC to sink (moving hands from armrests to counter) x 3 and stood for up to 2 minutes for pericare by OT; stood from recliner to stedy +2 max  (more difficult with feet elevated); stood from stedy seat +2 min assist    Balance Overall balance assessment: Needs assistance Sitting-balance support: Feet supported Sitting balance-Leahy Scale: Poor Sitting balance - Comments: edge of chair with posterior bias; edge of bed,incr posterior lean with difficulty figuring out how to correct it Postural control: Posterior lean;Right lateral lean Standing balance support: Bilateral upper extremity supported Standing balance-Leahy Scale: Zero Standing balance comment: pt requires outside support as well as B UE support on Stedy to maintain balance in standing                           ADL either performed or assessed with clinical judgement   ADL Overall ADL's : Needs assistance/impaired                             Toileting- Clothing Manipulation and Hygiene: Sit to/from stand;+2 for physical assistance;Moderate assistance Toileting - Clothing Manipulation Details (indicate cue type and reason): Pt stood at sink from recliner and then commode brought up behind pt.     Functional mobility during ADLs: Moderate assistance;+2 for physical assistance;Cueing for sequencing General ADL Comments: pt with improved fucntional mobility today;  pt verbalizing desire to participate to get stronger     Vision       Perception     Praxis      Cognition Arousal/Alertness: Awake/alert Behavior During Therapy: WFL for tasks assessed/performed Overall Cognitive Status: No family/caregiver present to determine baseline cognitive functioning Area of Impairment: Attention;Following commands;Safety/judgement;Awareness;Problem solving  Orientation Level: Disoriented to;Time Current Attention Level: Sustained   Following Commands: Follows one step commands with increased time Safety/Judgement: Decreased awareness of safety Awareness: Intellectual Problem Solving: Slow processing;Decreased  initiation;Difficulty sequencing;Requires verbal cues;Requires tactile cues General Comments: delayed processing with need of additional cues for completion of tasks        Exercises     Shoulder Instructions       General Comments on room air with sats 96-100% throughout (probe on earlobe); HR 70s; BP after standing 135/72    Pertinent Vitals/ Pain       Pain Assessment: No/denies pain Faces Pain Scale: No hurt  Home Living                                          Prior Functioning/Environment              Frequency  Min 2X/week        Progress Toward Goals  OT Goals(current goals can now be found in the care plan section)  Progress towards OT goals: Progressing toward goals  Acute Rehab OT Goals Patient Stated Goal: to feel better  OT Goal Formulation: With patient Time For Goal Achievement: 12/27/19 Potential to Achieve Goals: Good  Plan Discharge plan remains appropriate    Co-evaluation    PT/OT/SLP Co-Evaluation/Treatment: Yes Reason for Co-Treatment: For patient/therapist safety;To address functional/ADL transfers PT goals addressed during session: Mobility/safety with mobility;Balance;Strengthening/ROM OT goals addressed during session: ADL's and self-care;Other (comment)(activity tolerance)      AM-PAC OT "6 Clicks" Daily Activity     Outcome Measure     Help from another person taking care of personal grooming?: A Lot Help from another person toileting, which includes using toliet, bedpan, or urinal?: A Lot Help from another person bathing (including washing, rinsing, drying)?: A Lot Help from another person to put on and taking off regular upper body clothing?: A Lot Help from another person to put on and taking off regular lower body clothing?: A Lot 6 Click Score: 10    End of Session Equipment Utilized During Treatment: Other (comment);Gait belt(stedy)  OT Visit Diagnosis: Unsteadiness on feet (R26.81);Cognitive  communication deficit (R41.841);Muscle weakness (generalized) (M62.81)   Activity Tolerance Patient tolerated treatment well   Patient Left in bed;with call bell/phone within reach;with bed alarm set   Nurse Communication          Time: 9826-4158 OT Time Calculation (min): 41 min  Charges: OT General Charges $OT Visit: 1 Visit OT Treatments $Self Care/Home Management : 8-22 mins    Quay Burow, OTR/L 12/23/2019, 3:02 PM

## 2019-12-23 NOTE — Progress Notes (Signed)
Nutrition Follow-up  RD working remotely.  DOCUMENTATION CODES:   Not applicable  INTERVENTION:   - Continue 48-hour calorie count to assess need for Cortrak and tube feeds. Calorie count to begin today with breakfast meal and end 12/24/19 after dinner meal. RD will provide results of Day 1 of calorie count tomorrow, 12/24/19.  - Continue renal MVI daily  -Ensure Enlive po TID, each supplement provides 350 kcal and 20 grams of protein (strawberry flavor)  - Continue Pro-stat 30 ml po BID, each supplement provides 100 kcal and 15 grams of protein  - Encourage adequate PO intake and provide feeding assistance  NUTRITION DIAGNOSIS:   Inadequate oral intake related to poor appetite, vomiting as evidenced by meal completion < 25%.  Progressing, being addressed via supplements  GOAL:   Patient will meet greater than or equal to 90% of their needs  Progressing  MONITOR:   PO intake, Supplement acceptance, Labs, Weight trends, I & O's  REASON FOR ASSESSMENT:   Malnutrition Screening Tool    ASSESSMENT:   79 year old male who presented to the ED on 1/07 with generalized weakness and tremors. PMH of ESRD on HD (MTTS), CHF, anemia, atrial fibrillation, PEA arrest in March 2019, GERD, HIV, prostate cancer, CVA, HTN. Pt tested positive for COVID-19.  RD recommended Cortrak NG tube placement on 12/18/19. Noted that pt's wife and pt have discussed this with Palliative Care Team and that they decided to pursue Cortrak placement on 12/22/19 per note. Noted Cortrak consult in place.  RD consulted to initiate 48-hour calorie count. After discussing with Dr. Alfredia Ferguson, plan is to hold off on Cortrak placement until after calorie count is completed. Discussed with RN. RD d/c Cortrak consult per Dr. Alfredia Ferguson. MD to discuss plan with family.  If pt is not meeting his needs after calorie count is completed, plan is for one-week trial of Cortrak with tube feeds. If pt improves with tube feeds,  plan is to d/c to SNF. If not, plan is to d/c to hospice house.  Spoke with RN via phone call. RN reports pt is more alert this AM and told her that he "feels good today." RN reports pt completed 75% of his breakfast meal and drank a Boost Breeze supplement. RD to check in with RN periodically throughout the day regarding pt's PO intake.  RN reports that pt does not like eggs and that he does not like the Nepro supplements. RN states that pt's favorite supplement is the [employer]. RD will adjust orders appropriately.  Last HD was 1/18 with 1000 ml net UF. Post-HD weight was 80 kg. EDW is 83.5 kg. Today's weight is consistent with admission weight.  Per notes, pt continues to have intermittent nausea. Pt refusing Velphoro as he believes it makes his nausea worse.  RD was unable to reach pt via phone call to room.  Meal Completion: 40-80% x last 8 meals  Medications reviewed and include: vitamin C, calcitriol, decadron, Pepcid, Nepro TID, Pro-stat BID, SSI q 4 hours, rena-vit, Velphoro (pt refusing), warfarin, zinc sulfate  Labs reviewed: phosphorus 6.7, hemoglobin 7.5 CBG's: 113-291 x 24 hours  Diet Order:   Diet Order            Diet renal with fluid restriction Fluid restriction: 1200 mL Fluid; Room service appropriate? No; Fluid consistency: Thin  Diet effective now              EDUCATION NEEDS:   No education needs have been identified at this  time  Skin:  Skin Assessment: Skin Integrity Issues: Incisions: right thigh  Last BM:  12/20/19  Height:   Ht Readings from Last 1 Encounters:  12/12/19 6\' 2"  (1.88 m)    Weight:   Wt Readings from Last 1 Encounters:  12/23/19 80.2 kg    Ideal Body Weight:  86.4 kg  BMI:  Body mass index is 22.7 kg/m.  Estimated Nutritional Needs:   Kcal:  2100-2300  Protein:  105-120 grams  Fluid:  1000 ml + UOP    Gaynell Face, MS, RD, LDN Inpatient Clinical Dietitian Pager:  [phone] Weekend/After Hours: [phone]

## 2019-12-23 NOTE — Plan of Care (Signed)
  Problem: Education: Goal: Knowledge of General Education information will improve Description: Including pain rating scale, medication(s)/side effects and non-pharmacologic comfort measures Outcome: Progressing   Problem: Clinical Measurements: Goal: Ability to maintain clinical measurements within normal limits will improve Outcome: Progressing Goal: Diagnostic test results will improve Outcome: Progressing Goal: Respiratory complications will improve Outcome: Progressing Goal: Cardiovascular complication will be avoided Outcome: Progressing   Problem: Nutrition: Goal: Adequate nutrition will be maintained Outcome: Progressing   Problem: Coping: Goal: Level of anxiety will decrease Outcome: Progressing

## 2019-12-23 NOTE — Progress Notes (Signed)
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  Found to be Covid +.    PT Comments    Patient with brighter affect--up in chair on arrival. Had been eating lunch. Patient tolerated standing with +2 moderate assist from recliner to sink multiple reps with improving upright tolerance/posture each time. Patient assisted on/off BSC by having pt stand at sink and replace recliner with BSC. Due to pt fatigue and for incr safety, used stedy for transfer from recliner to bed. Patient pleased with his increased abilities today.    Follow Up Recommendations  SNF;Supervision/Assistance - 24 hour     Equipment Recommendations  Other (comment)(TBD)    Recommendations for Other Services       Precautions / Restrictions Precautions Precautions: Fall Restrictions Weight Bearing Restrictions: No    Mobility  Bed Mobility Overal bed mobility: Needs Assistance Bed Mobility: Sit to Sidelying;Rolling Rolling: Min assist(from rt side to back)       Sit to sidelying: +2 for physical assistance;Min assist;+2 for safety/equipment General bed mobility comments: slow processing requiring incr cues  Transfers Overall transfer level: Needs assistance   Transfers: Sit to/from Stand Sit to Stand: +2 physical assistance;Mod assist;+2 safety/equipment         General transfer comment: stood from recliner/BSC to sink (moving hands from armrests to counter) x 3 and stood for up to 2 minutes for pericare  by OT; stood from recliner to stedy +2 max (more difficult with feet elevated); stood from stedy seat +2 min assist  Ambulation/Gait                 Stairs             Wheelchair Mobility    Modified Rankin (Stroke Patients Only)       Balance Overall balance assessment: Needs assistance Sitting-balance support: Feet supported Sitting balance-Leahy Scale: Poor Sitting balance - Comments: edge of chair with posterior bias; edge of bed,incr posterior lean with difficulty figuring out how to correct it Postural control: Posterior lean;Right lateral lean Standing balance support: Bilateral upper extremity supported Standing balance-Leahy Scale: Zero Standing balance comment: pt requires outside support as well as B UE support on Stedy to maintain balance in standing                            Cognition Arousal/Alertness: Awake/alert Behavior During Therapy: WFL for tasks assessed/performed(some smiling!) Overall Cognitive Status: No family/caregiver present to determine baseline cognitive functioning Area of Impairment: Attention;Following commands;Safety/judgement;Awareness;Problem solving                   Current Attention Level: Sustained   Following Commands: Follows one step commands with increased time   Awareness: Intellectual Problem Solving: Slow processing;Decreased initiation;Difficulty sequencing;Requires verbal cues;Requires tactile cues General Comments: delayed processing with need of additional cues for completion of tasks      Exercises      General Comments General comments (skin integrity, edema, etc.): on room air with sats 96-100% throughout (probe  on earlobe); HR 70s; BP after standing 135/72      Pertinent Vitals/Pain Pain Assessment: Faces Faces Pain Scale: No hurt    Home Living                      Prior Function            PT Goals (current goals can now be found in the care plan section) Acute  Rehab PT Goals Patient Stated Goal: to feel better  Time For Goal Achievement: 12/26/19 Potential to Achieve Goals: Good Progress towards PT goals: Progressing toward goals    Frequency    Min 3X/week      PT Plan Current plan remains appropriate    Co-evaluation PT/OT/SLP Co-Evaluation/Treatment: Yes Reason for Co-Treatment: For patient/therapist safety;To address functional/ADL transfers PT goals addressed during session: Mobility/safety with mobility;Balance;Strengthening/ROM        AM-PAC PT "6 Clicks" Mobility   Outcome Measure  Help needed turning from your back to your side while in a flat bed without using bedrails?: A Lot Help needed moving from lying on your back to sitting on the side of a flat bed without using bedrails?: A Lot Help needed moving to and from a bed to a chair (including a wheelchair)?: A Lot Help needed standing up from a chair using your arms (e.g., wheelchair or bedside chair)?: A Lot Help needed to walk in hospital room?: Total Help needed climbing 3-5 steps with a railing? : Total 6 Click Score: 10    End of Session Equipment Utilized During Treatment: Gait belt Activity Tolerance: Patient limited by fatigue Patient left: in bed;with call bell/phone within reach;with bed alarm set   PT Visit Diagnosis: Other abnormalities of gait and mobility (R26.89);Muscle weakness (generalized) (M62.81);Other symptoms and signs involving the nervous system (R29.898)     Time: 7972-8206 PT Time Calculation (min) (ACUTE ONLY): 41 min  Charges:  $Therapeutic Activity: 23-37 mins                      Arby Barrette, PT Pager [phone]    Rexanne Mano 12/23/2019, 2:25 PM

## 2019-12-23 NOTE — Progress Notes (Signed)
[facility] Progress Note   OP HD: [facility] (MWF here) 4h 400/1.5 83.5kg 2/2 bath P4 AVG thigh Hep 2000 - Mircera 16mcg IV q 2 weeks (last 12/06/19) - Calcitriol 0.49mcg PO q HD  Assessment/ Plan:   1)End-stage renal disease -outpt HD is MTTS schedule. Having nausea/ taste issues, presumably due to uremia. Bec of hospital ESRD census will try MWF with daily evaluation for improvement and if not better then will HD Sat as well given that there are a lot of pts on TTS schedule  Plan on HD tomorrow (orders already written); 1L net UF Monday  2) Nausea/ poor po intake - better  3)COVID-19 infection -Therapies per primary team - on IV dexamethasone and sp remdesivir  4) Combined chronic systolic and diastolic chronic end-stage heart failure - optimize volume with HD as able -Status post cardiology evaluation with little therapy to add given his hypotension - is3kg under dry wt  5) DNR - appreciate pall care assistance  6)Chronic hypotension - onmidodrine  7) Anemia secondary to CKD - On ESA-last dosed on 1/2 as above - had blood loss from HD cathat HDFriday, HD cath checked and therewas no crackso HD completed;gave IV vancx1. Hb dropped to 7.9. given 1U 1/15  8)Secondary hyperparathyroidism -Will continue calcitriol inpatient and will be administered at his dialysis unit upon discharge.renal diet. On velphoro -> incr to 1gm TIDM  9) H/o CVA  10) HIV - on meds  11) Atrial fibrillation - appears chronic  Subjective:   Feels that his breathing is much better. Denies f/ c/n/v overnight.   Objective:   BP 102/61 (BP Location: Right Arm)   Pulse 71   Temp 97.8 F (36.6 C) (Oral)   Resp 16   Ht 6\' 2"  (1.88 m)   Wt 80.2 kg   SpO2 97%   BMI 22.70 kg/m   Intake/Output Summary (Last 24 hours) at 12/23/2019 0831 Last data filed at 12/22/2019 2300 Gross per 24 hour  Intake 674 ml  Output 1000 ml  Net -326 ml    Weight change: 0.3 kg  Physical Exam: GEN: NAD, A&Ox3, NCAT HEENT: No conjunctival pallor, EOMI NECK: Supple, no thyromegaly LUNGS: CTA B/L no rales, rhonchi or wheezing CV: irreg irreg ABD: SNDNT +BS  EXT: No lower extremity edema ACCESS: rt thigh AVG +bruit, RIJ TC  Imaging: No results found.  Labs: BMET Recent Labs  Lab 12/17/19 0500 12/18/19 0448 12/19/19 0346 12/20/19 0741 12/21/19 0500 12/22/19 1152 12/23/19 0414  NA 139 138 136 138 137 135 137  K 4.5 4.2 5.3* 4.8 5.2* 3.4* 4.6  CL 94* 94* 92* 98 96* 92* 95*  CO2 23 26 23 24 22 26 25   GLUCOSE 69* 103* 172* 146* 145* 119* 199*  BUN 108* 84* 110* 69* 102* 48* 68*  CREATININE 10.98* 9.78* 12.00* 7.98* 9.83* 4.59* 7.20*  CALCIUM 8.6* 8.6* 8.5* 8.4* 8.8* 8.5* 8.5*  PHOS 9.8* 8.5* 10.0* 9.7* 10.0* 3.9 6.7*   CBC Recent Labs  Lab 12/20/19 0741 12/21/19 0500 12/22/19 1152 12/23/19 0414  WBC 5.3 7.3 12.3* 9.3  NEUTROABS 4.4 6.5 10.7* 8.6*  HGB 8.0* 7.9* 8.7* 7.5*  HCT 23.9* 23.9* 25.6* 22.8*  MCV 104.4* 105.3* 102.4* 105.6*  PLT 335 345 471* 328    Medications:    . sodium chloride   Intravenous Once  . allopurinol  100 mg Oral Once per day on Mon Wed Fri  . amiodarone  200 mg Oral Daily  . vitamin C  500 mg Oral Daily  . bictegravir-emtricitabine-tenofovir AF  1 tablet Oral Daily  . brimonidine  1 drop Left Eye BID  . calcitRIOL  0.5 mcg Oral Q M,W,F-HD  . Chlorhexidine Gluconate Cloth  6 each Topical Q0600  . dexamethasone (DECADRON) injection  6 mg Intravenous Q24H  . dorzolamide-timolol  1 drop Left Eye BID  . famotidine  10 mg Oral Daily  . feeding supplement (NEPRO CARB STEADY)  237 mL Oral TID BM  . feeding supplement (PRO-STAT SUGAR FREE 64)  30 mL Oral BID  . insulin aspart  0-9 Units Subcutaneous Q4H  . latanoprost  1 drop Left Eye QHS  . midodrine  10 mg Oral Q M,W,F-HD  . montelukast  10 mg Oral QHS  . multivitamin  1 tablet Oral QHS  . sucroferric oxyhydroxide  1,000 mg Oral TID WC   . Warfarin - Pharmacist Dosing Inpatient   Does not apply q1800  . zinc sulfate  220 mg Oral Daily      Otelia Santee, MD 12/23/2019, 8:31 AM

## 2019-12-23 NOTE — Progress Notes (Signed)
PALLIATIVE NOTE:  Chart reviewed and updates received from RN. Wife called requesting follow-up. She informed my team member that patient and her spoke yesterday night and he had decided against the previously planned feeding trial via Coretrak and was tired of fighting.   I discussed with patient and wife at length via phone. Patient reports he did make those statements on last night however he was confused about what he really wanted and was "having a down moment about his health and all that was going on". Support given. He is in a better mood today and reports he is feeling much better. He states "I am feeling better today than I have in the past month!" Wife is appreciative of his improvement as well.   Patient reports having an increased appetite this morning and eating breakfast without complications. He reports he is ready for lunch and to see what the day holds for him. He is not interested at this time in artifical feedings and reports he is hopeful he has turned the corner and will continue to do well.   I spoke with wife afterwards and she reports she is happy patient is doing much better today, however she continues to remain concerned with how well he will truly do in the future and do not want to overlook him feeling better but also do not want to get overly enthused. Support given.   Patient and wife expressed goals of continued treatment, no trial feeding tube, and watchful waiting.   Plan -DNR -Patient feeling much better today. Reports from patient and RN patient is eating and tolerating well. -Continue with current plan of care per medical team -Watchful waiting as wife aware patient's prognosis remains poor regardless of current state of health and feelings by patient of well-being.  -PMT will continue to support and follow   The above conversation was completed via telephone due to the visitor restrictions during the COVID-19 pandemic. Thorough chart review and discussion  with necessary members of the care team was completed as part of assessment. All issues were discussed and addressed but no physical exam was performed.  Total Time: 45 min.   Greater than 50%  of this time was spent counseling and coordinating care related to the above assessment and plan    Alda Lea, AGPCNP-BC Palliative Medicine Team  Phone: [phone] Pager: [phone] Amion: N. Cousar

## 2019-12-24 ENCOUNTER — Inpatient Hospital Stay (HOSPITAL_COMMUNITY): Payer: Medicare HMO

## 2019-12-24 LAB — CBC WITH DIFFERENTIAL/PLATELET
Abs Immature Granulocytes: 0.09 10*3/uL — ABNORMAL HIGH (ref 0.00–0.07)
Basophils Absolute: 0 10*3/uL (ref 0.0–0.1)
Basophils Relative: 0 %
Eosinophils Absolute: 0 10*3/uL (ref 0.0–0.5)
Eosinophils Relative: 0 %
HCT: 21.6 % — ABNORMAL LOW (ref 39.0–52.0)
Hemoglobin: 7.1 g/dL — ABNORMAL LOW (ref 13.0–17.0)
Immature Granulocytes: 1 %
Lymphocytes Relative: 5 %
Lymphs Abs: 0.7 10*3/uL (ref 0.7–4.0)
MCH: 34.3 pg — ABNORMAL HIGH (ref 26.0–34.0)
MCHC: 32.9 g/dL (ref 30.0–36.0)
MCV: 104.3 fL — ABNORMAL HIGH (ref 80.0–100.0)
Monocytes Absolute: 0.4 10*3/uL (ref 0.1–1.0)
Monocytes Relative: 3 %
Neutro Abs: 12.4 10*3/uL — ABNORMAL HIGH (ref 1.7–7.7)
Neutrophils Relative %: 91 %
Platelets: 281 10*3/uL (ref 150–400)
RBC: 2.07 MIL/uL — ABNORMAL LOW (ref 4.22–5.81)
RDW: 14.2 % (ref 11.5–15.5)
WBC: 13.6 10*3/uL — ABNORMAL HIGH (ref 4.0–10.5)
nRBC: 0.1 % (ref 0.0–0.2)

## 2019-12-24 LAB — GLUCOSE, CAPILLARY
Glucose-Capillary: 149 mg/dL — ABNORMAL HIGH (ref 70–99)
Glucose-Capillary: 157 mg/dL — ABNORMAL HIGH (ref 70–99)
Glucose-Capillary: 160 mg/dL — ABNORMAL HIGH (ref 70–99)
Glucose-Capillary: 201 mg/dL — ABNORMAL HIGH (ref 70–99)
Glucose-Capillary: 202 mg/dL — ABNORMAL HIGH (ref 70–99)
Glucose-Capillary: 250 mg/dL — ABNORMAL HIGH (ref 70–99)

## 2019-12-24 LAB — COMPREHENSIVE METABOLIC PANEL
ALT: 22 U/L (ref 0–44)
AST: 16 U/L (ref 15–41)
Albumin: 2.4 g/dL — ABNORMAL LOW (ref 3.5–5.0)
Alkaline Phosphatase: 90 U/L (ref 38–126)
Anion gap: 19 — ABNORMAL HIGH (ref 5–15)
BUN: 107 mg/dL — ABNORMAL HIGH (ref 8–23)
CO2: 23 mmol/L (ref 22–32)
Calcium: 8.9 mg/dL (ref 8.9–10.3)
Chloride: 90 mmol/L — ABNORMAL LOW (ref 98–111)
Creatinine, Ser: 10.03 mg/dL — ABNORMAL HIGH (ref 0.61–1.24)
GFR calc Af Amer: 5 mL/min — ABNORMAL LOW (ref >60)
GFR calc non Af Amer: 4 mL/min — ABNORMAL LOW (ref >60)
Glucose, Bld: 155 mg/dL — ABNORMAL HIGH (ref 70–99)
Potassium: 4.7 mmol/L (ref 3.5–5.1)
Sodium: 132 mmol/L — ABNORMAL LOW (ref 135–145)
Total Bilirubin: 0.9 mg/dL (ref 0.3–1.2)
Total Protein: 5.2 g/dL — ABNORMAL LOW (ref 6.5–8.1)

## 2019-12-24 LAB — MAGNESIUM: Magnesium: 2.5 mg/dL — ABNORMAL HIGH (ref 1.7–2.4)

## 2019-12-24 LAB — PROTIME-INR
INR: 1.8 — ABNORMAL HIGH (ref 0.8–1.2)
Prothrombin Time: 20.4 seconds — ABNORMAL HIGH (ref 11.4–15.2)

## 2019-12-24 LAB — HEPATITIS B SURFACE ANTIGEN: Hepatitis B Surface Ag: NONREACTIVE

## 2019-12-24 LAB — FERRITIN: Ferritin: 737 ng/mL — ABNORMAL HIGH (ref 24–336)

## 2019-12-24 LAB — D-DIMER, QUANTITATIVE: D-Dimer, Quant: 4.31 ug/mL-FEU — ABNORMAL HIGH (ref 0.00–0.50)

## 2019-12-24 LAB — SEDIMENTATION RATE: Sed Rate: 45 mm/hr — ABNORMAL HIGH (ref 0–16)

## 2019-12-24 LAB — PHOSPHORUS: Phosphorus: 8 mg/dL — ABNORMAL HIGH (ref 2.5–4.6)

## 2019-12-24 LAB — FIBRINOGEN: Fibrinogen: 359 mg/dL (ref 210–475)

## 2019-12-24 LAB — LACTATE DEHYDROGENASE: LDH: 155 U/L (ref 98–192)

## 2019-12-24 LAB — C-REACTIVE PROTEIN: CRP: 1.5 mg/dL — ABNORMAL HIGH (ref <1.0)

## 2019-12-24 MED ORDER — HEPARIN SODIUM (PORCINE) 1000 UNIT/ML IJ SOLN
3.4000 mL | Freq: Once | INTRAMUSCULAR | Status: AC
  Administered 2019-12-24: 3400 [IU] via INTRAVENOUS

## 2019-12-24 MED ORDER — MIDODRINE HCL 5 MG PO TABS
ORAL_TABLET | ORAL | Status: AC
  Filled 2019-12-24: qty 2

## 2019-12-24 MED ORDER — HEPARIN SODIUM (PORCINE) 1000 UNIT/ML IJ SOLN
INTRAMUSCULAR | Status: AC
  Filled 2019-12-24: qty 4

## 2019-12-24 MED ORDER — CALCITRIOL 0.5 MCG PO CAPS
ORAL_CAPSULE | ORAL | Status: AC
  Filled 2019-12-24: qty 1

## 2019-12-24 MED ORDER — WARFARIN SODIUM 5 MG PO TABS
5.0000 mg | ORAL_TABLET | Freq: Once | ORAL | Status: AC
  Administered 2019-12-24: 5 mg via ORAL
  Filled 2019-12-24: qty 1

## 2019-12-24 NOTE — Progress Notes (Signed)
ANTICOAGULATION CONSULT NOTE - Follow Up Consult  Pharmacy Consult for Warfarin Indication: atrial fibrillation  Allergies  Allergen Reactions  . Dextromethorphan-Guaifenesin     UNKNOWN   . Tocotrienols     UNKNOWN   . Losartan Potassium Other (See Comments)    Causes constipation    Patient Measurements: Height: 6\' 2"  (188 cm) Weight: 176 lb 12.9 oz (80.2 kg) IBW/kg (Calculated) : 82.2  Vital Signs: Temp: 97.5 F (36.4 C) (01/20 0805) Temp Source: Axillary (01/20 0805) BP: 98/51 (01/20 1000) Pulse Rate: 77 (01/20 1000)  Labs: Recent Labs    12/22/19 0406 12/22/19 1152 12/22/19 1152 12/23/19 0414 12/24/19 0706  HGB  --  8.7*   < > 7.5* 7.1*  HCT  --  25.6*  --  22.8* 21.6*  PLT  --  471*  --  328 281  LABPROT 30.4*  --   --  21.5* 20.4*  INR 2.9*  --   --  1.9* 1.8*  CREATININE  --  4.59*  --  7.20* 10.03*   < > = values in this interval not displayed.   Assessment: 79 yo M on warfarin for Afib (CHADS2VASc = 8) admitted 12/11/19 for COVID and elevated D-Dimer > 5 on admission. Of note, pt had INR 1.2 on 1/6 and coumadin clinic instructed him to take 10mg  that day then resume previous dose.   INR 1.8 today after 5 mg dose on 1/19.  INR was 1.9 on 1/19 (a whole point down from 1/18) after holding warfarin for 6 days then a low dose of 2 mg due to previous supratherapeutic INRs. Bleeding episode was reported in HD on 1/15. After episode, patient received 2 pRBC. H/H has remained low - Hgb 7.1. Platelets are stable.  Patient has had nausea and poor appetite - now somewhat improved with 50% of meal intake documented.   PTA warfarin dose 2.5mg  on Mon + Fri and 5mg  all other days  Goal of Therapy:  INR 2-3 Monitor platelets by anticoagulation protocol: Yes   Plan:   Warfarin 5 mg again today  Daily PT/INR  CBC in am  Monitor for s/sx bleeding.  Arty Baumgartner, [place] Phone: [phone] 12/24/2019,10:36 AM

## 2019-12-24 NOTE — Progress Notes (Addendum)
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   -Initially not started on any Covid treatment as he was not hypoxic however had increasing respiratory rate and CRP 22.9 on 1/8 and so was started on dexamethasone and remdesivir.  -Cardiology consulted for elevated troponin but not deemed a candidate for cardiac intervention, likely demand ischemia and no therapies recommended.   -Nephrology was consulted due to ESRD and palliative care team consulted and patient is now a DNR -Hospital course complicated by ongoing failure to thrive, poor p.o. intake, palliative conversations ongoing  Assessment & Plan:   COVID-19 virus infection -On admission: Febrile, lactic acidosis. increased respiratory rate to >30, vitals. Now on room air   -CRP peaked at 24, currently downtrending, did not get actemra due to HIV status -Completed 4 days dexamethasone, stopped 1/11 due to uremia (BUN 99) and tolerating room air but this was resumed 1/14 as inflammatory markers started trending upwards, will discontinue this now -Remdesivir course Completed -Wean O2  Nausea and vomiting -Improved with supportive care, could be secondary to above  Elevated Troponin with extensive cardiac history End-stage heart failure, systolic and diastolic CHF History of PEA arrest Cardiomyopathy, EF of 20% -Troponin255->271 on admission -Asymptomatic  with baseline LBBB on EKG -Cardiology consulted-no indication for cardiac intervention and recommended end-stage heart failure and recommending optimizing volume status with hemodialysis -Palliative care consulted, as above goals of care conversation had and he is now DNR -Poor prognosis, see Almyra Deforest, PA note from 12/10/19 for complete details  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 Eden Lathe on 11/10/2019, unfortunately this thrombosed and underwent thrombectomy by Dr. Carlis Abbott on 12/14. Eventually underwent a right SFA to common femoral vein AV loop graft by Dr. 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 every 6 as needed severe pain  History of HIV  -[name]  PreDiabetes -CBG's ranging 152-192 -C/w Insulin Aspart 0-9 Sensitive Scale q4h  Ambulatory Dysfunction -Uses cane and walker at baseline -PT/OT recommending SNF at discharge will need to make sure that he can adequately take p.o. nutrition  DVT prophylaxis: Anticoagulated with Coumadin Code Status: DO NOT RESUSCITATE  Family Communication: No family present at bedside will update wife tomorrow Disposition Plan: Pending Clinical Improvement and D/C to SNF  Consultants:   Cardiology  Nephrology   Palliative Care   Procedures: VENOUS DUPLEX Upper and Lower  Upper showed No DVT and Lower showed No DVT   Antimicrobials: Anti-infectives (From admission, onward)   Start  Dose/Rate Route  Frequency Ordered Stop   12/19/19 1500  vancomycin (VANCOREADY) IVPB 1500 mg/300 mL     1,500 mg 150 mL/hr over 120 Minutes Intravenous  Once 12/19/19 1446 12/19/19 1750   12/18/19 2000  bictegravir-emtricitabine-tenofovir AF ([name]) 50-200-25 MG per tablet 1 tablet     1 tablet Oral Daily 12/17/19 1514     12/13/19 1000  remdesivir 100 mg in sodium chloride 0.9 % 100 mL IVPB     100 mg 200 mL/hr over 30 Minutes Intravenous Every 24 hours 12/12/19 0724 12/16/19 1152   12/12/19 1000  bictegravir-emtricitabine-tenofovir AF ([name]) 50-200-25 MG per tablet 1 tablet  Status:  Discontinued     1 tablet Oral Daily 12/11/19 2024 12/17/19 1514   12/12/19 0800  remdesivir 200 mg in sodium chloride 0.9% 250 mL IVPB     200 mg 580 mL/hr over 30 Minutes Intravenous Once 12/12/19 0724 12/12/19 1253     Subjective: -No events overnight, reportedly eating a little better -Denies any complaints this morning, seen on dialysis on 5 C  Objective: Vitals:   12/24/19 1100 12/24/19 1108 12/24/19 1124 12/24/19 1143  BP: (!) 101/45 (!) 105/54 105/68 116/69  Pulse: 77 79 66 81  Resp: 18 17 16 20   Temp:  (!) 97.5 F (36.4 C) 97.8 F (36.6 C)   TempSrc:  Oral Oral   SpO2:  95% 100% 100%  Weight:  79.2 kg    Height:        Intake/Output Summary (Last 24 hours) at 12/24/2019 1549 Last data filed at 12/24/2019 1337 Gross per 24 hour  Intake 180 ml  Output 1000 ml  Net -820 ml   Filed Weights   12/24/19 0450 12/24/19 0805 12/24/19 1108  Weight: 81.9 kg 80.2 kg 79.2 kg   Examination: Physical Exam:  Gen: Elderly chronically ill-appearing male laying in bed, on dialysis, no distress HEENT: no JVD Lungs: Diminished breath sounds bilaterally CVS: RRR,No Gallops,Rubs or new Murmurs Abd: soft, Non tender, non distended, BS present Extremities: Trace edema  skin: no new rashes  Data Reviewed: I have personally reviewed following labs and imaging studies  CBC: Recent Labs  Lab 12/20/19 0741  12/21/19 0500 12/22/19 1152 12/23/19 0414 12/24/19 0706  WBC 5.3 7.3 12.3* 9.3 13.6*  NEUTROABS 4.4 6.5 10.7* 8.6* 12.4*  HGB 8.0* 7.9* 8.7* 7.5* 7.1*  HCT 23.9* 23.9* 25.6* 22.8* 21.6*  MCV 104.4* 105.3* 102.4* 105.6* 104.3*  PLT 335 345 471* 328 440   Basic Metabolic Panel: Recent Labs  Lab 12/20/19 0741 12/21/19 0500 12/22/19 1152 12/23/19 0414 12/24/19 0706  NA 138 137 135 137 132*  K 4.8 5.2* 3.4* 4.6 4.7  CL 98 96* 92* 95* 90*  CO2 24 22 26 25 23   GLUCOSE 146* 145* 119* 199* 155*  BUN 69* 102* 48* 68* 107*  CREATININE 7.98* 9.83* 4.59* 7.20* 10.03*  CALCIUM 8.4* 8.8* 8.5* 8.5* 8.9  MG 2.3 2.5* 2.2 2.3 2.5*  PHOS 9.7* 10.0* 3.9 6.7* 8.0*   GFR: Estimated Creatinine Clearance: 6.9 mL/min (A) (by C-G formula based on SCr of 10.03 mg/dL (H)). Liver Function Tests: Recent Labs  Lab 12/20/19 0741 12/21/19 0500 12/22/19 1152 12/23/19 0414 12/24/19 0706  AST 18 20 20 21 16   ALT 19 25 27 26 22   ALKPHOS 68 73 101 87 90  BILITOT 0.8 0.8 0.6 0.3 0.9  PROT 5.6* 5.6* 6.5 5.5* 5.2*  ALBUMIN 2.1* 2.4* 2.8* 2.4* 2.4*   No results for input(s): LIPASE, AMYLASE  in the last 168 hours. No results for input(s): AMMONIA in the last 168 hours. Coagulation Profile: Recent Labs  Lab 12/20/19 0741 12/21/19 0924 12/22/19 0406 12/23/19 0414 12/24/19 0706  INR 4.3* 3.2* 2.9* 1.9* 1.8*   Cardiac Enzymes: No results for input(s): CKTOTAL, CKMB, CKMBINDEX, TROPONINI in the last 168 hours. BNP (last 3 results) No results for input(s): PROBNP in the last 8760 hours. HbA1C: No results for input(s): HGBA1C in the last 72 hours. CBG: Recent Labs  Lab 12/24/19 0018 12/24/19 0404 12/24/19 0450 12/24/19 0755 12/24/19 1129  GLUCAP 250* 160* 202* 149* 157*   Lipid Profile: No results for input(s): CHOL, HDL, LDLCALC, TRIG, CHOLHDL, LDLDIRECT in the last 72 hours. Thyroid Function Tests: No results for input(s): TSH, T4TOTAL, FREET4, T3FREE, THYROIDAB in the last 72  hours. Anemia Panel: Recent Labs    12/22/19 0406 12/24/19 0706  FERRITIN 850* 737*   Sepsis Labs: No results for input(s): PROCALCITON, LATICACIDVEN in the last 168 hours.  No results found for this or any previous visit (from the past 240 hour(s)).   RN Pressure Injury Documentation:     Estimated body mass index is 22.42 kg/m as calculated from the following:   Height as of this encounter: 6\' 2"  (1.88 m).   Weight as of this encounter: 79.2 kg.  Malnutrition Type:  Nutrition Problem: Inadequate oral intake Etiology: poor appetite, vomiting   Malnutrition Characteristics:  Signs/Symptoms: meal completion < 25%   Nutrition Interventions:  Interventions: Nepro shake, MVI, Prostat, Liberalize Diet   Radiology Studies: DG CHEST PORT 1 VIEW  Result Date: 12/24/2019 CLINICAL DATA:  Shortness of breath, dialysis EXAM: PORTABLE CHEST 1 VIEW COMPARISON:  12/23/2019 FINDINGS: Cardiomegaly. Large or right chest multi lumen vascular catheter. Both lungs are clear. The visualized skeletal structures are unremarkable. IMPRESSION: Cardiomegaly without acute abnormality of the lungs in AP portable projection. Electronically Signed   By: Eddie Candle M.D.   On: 12/24/2019 08:25   DG CHEST PORT 1 VIEW  Result Date: 12/23/2019 CLINICAL DATA:  Tremors, shortness of breath EXAM: PORTABLE CHEST 1 VIEW COMPARISON:  12/18/2019 FINDINGS: Improving bilateral lower lung hazy airspace disease. No pleural effusion or pneumothorax. Stable cardiomegaly. Large bore right jugular central venous catheter with the tip projecting over the cavoatrial junction. No acute osseous abnormality. IMPRESSION: Stable cardiomegaly. Improving bilateral lower lung hazy airspace disease. Electronically Signed   By: Kathreen Devoid   On: 12/23/2019 08:58   Scheduled Meds: . allopurinol  100 mg Oral Once per day on Mon Wed Fri  . amiodarone  200 mg Oral Daily  . vitamin C  500 mg Oral Daily  .  bictegravir-emtricitabine-tenofovir AF  1 tablet Oral Daily  . brimonidine  1 drop Left Eye BID  . calcitRIOL  0.5 mcg Oral Q M,W,F-HD  . Chlorhexidine Gluconate Cloth  6 each Topical Q0600  . dexamethasone (DECADRON) injection  6 mg Intravenous Q24H  . dorzolamide-timolol  1 drop Left Eye BID  . famotidine  10 mg Oral Daily  . feeding supplement (ENSURE ENLIVE)  237 mL Oral TID BM  . feeding supplement (PRO-STAT SUGAR FREE 64)  30 mL Oral BID  . insulin aspart  0-9 Units Subcutaneous Q4H  . latanoprost  1 drop Left Eye QHS  . midodrine  10 mg Oral Q M,W,F-HD  . montelukast  10 mg Oral QHS  . multivitamin  1 tablet Oral QHS  . sucroferric oxyhydroxide  1,000 mg Oral TID WC  . warfarin  5  mg Oral ONCE-1800  . Warfarin - Pharmacist Dosing Inpatient   Does not apply q1800  . zinc sulfate  220 mg Oral Daily   Continuous Infusions:   LOS: 13 days   Domenic Polite, MD

## 2019-12-24 NOTE — Progress Notes (Addendum)
[facility] Progress Note   OP HD: [facility] (MWF here) 4h 400/1.5 83.5kg 2/2 bath P4 AVG thigh Hep 2000 - Mircera 73mcg IV q 2 weeks (last 12/06/19) - Calcitriol 0.63mcg PO q HD  Assessment/ Plan:   1)End-stage renal disease -outpt HD is MTTS schedule. Having nausea/ taste issues, presumably due to uremia.Bec of hospital ESRD census will try MWF with daily evaluation for improvement and if not better then will HD Sat as well given that there are a lot of pts on TTS schedule. I will check on the pt census fri afternoon and sat AM. If there is availability then will HD Sat as well.  Plan on HD today; 1L net UF Monday  2)Nausea/ poor po intake - on Zofran  3)COVID-19 infection -Therapies per primary team - on IV dexamethasone and sp remdesivir  4)Combined chronic systolic and diastolic chronic end-stage heart failure - optimize volume with HD as able -Status post cardiology evaluation with little therapy to add given his hypotension - is2-3kg under dry wt  5)DNR - appreciate pall care assistance  6)Chronic hypotension - onmidodrine  7)Anemia secondary to CKD - On ESA-last dosed on 1/2 as above - had blood loss from HD cathat HDFriday, HD cath checked and therewas no crackso HD completed;gave IV vancx1. Hb dropped to 7.9. given 1U 1/15  8)Secondary hyperparathyroidism -Will continue calcitriol inpatient and will be administered at his dialysis unit upon discharge.renal diet. On velphoro-> incr to 1gm TIDM. Recheck phos on fri  9)H/o CVA  10)HIV - on meds  11)Atrial fibrillation - appears chronic  Subjective:   Feels that his breathing is much better. No n/v/dyspnea/f overnight.   Objective:   BP 112/61 (BP Location: Left Arm)   Pulse 68   Temp 97.9 F (36.6 C) (Axillary)   Resp 13   Ht 6\' 2"  (1.88 m)   Wt 81.9 kg   SpO2 100%   BMI 23.18 kg/m   Intake/Output Summary (Last 24 hours) at 12/24/2019  0809 Last data filed at 12/24/2019 0017 Gross per 24 hour  Intake 1241 ml  Output 0 ml  Net 1241 ml   Weight change: 0.9 kg  Physical Exam: GEN: NAD, A&Ox3, NCAT HEENT: No conjunctival pallor, EOMI NECK: Supple, no thyromegaly LUNGS: CTA B/L no rales, rhonchi or wheezing ER:XVQMG irreg ABD: SNDNT +BS  EXT: No lower extremity edema ACCESS: rt thigh AVG +pulse, RIJ TC  Imaging: DG CHEST PORT 1 VIEW  Result Date: 12/23/2019 CLINICAL DATA:  Tremors, shortness of breath EXAM: PORTABLE CHEST 1 VIEW COMPARISON:  12/18/2019 FINDINGS: Improving bilateral lower lung hazy airspace disease. No pleural effusion or pneumothorax. Stable cardiomegaly. Large bore right jugular central venous catheter with the tip projecting over the cavoatrial junction. No acute osseous abnormality. IMPRESSION: Stable cardiomegaly. Improving bilateral lower lung hazy airspace disease. Electronically Signed   By: Kathreen Devoid   On: 12/23/2019 08:58    Labs: BMET Recent Labs  Lab 12/18/19 0448 12/19/19 0346 12/20/19 0741 12/21/19 0500 12/22/19 1152 12/23/19 0414  NA 138 136 138 137 135 137  K 4.2 5.3* 4.8 5.2* 3.4* 4.6  CL 94* 92* 98 96* 92* 95*  CO2 26 23 24 22 26 25   GLUCOSE 103* 172* 146* 145* 119* 199*  BUN 84* 110* 69* 102* 48* 68*  CREATININE 9.78* 12.00* 7.98* 9.83* 4.59* 7.20*  CALCIUM 8.6* 8.5* 8.4* 8.8* 8.5* 8.5*  PHOS 8.5* 10.0* 9.7* 10.0* 3.9 6.7*   CBC Recent Labs  Lab 12/20/19 0741  12/21/19 0500 12/22/19 1152 12/23/19 0414  WBC 5.3 7.3 12.3* 9.3  NEUTROABS 4.4 6.5 10.7* 8.6*  HGB 8.0* 7.9* 8.7* 7.5*  HCT 23.9* 23.9* 25.6* 22.8*  MCV 104.4* 105.3* 102.4* 105.6*  PLT 335 345 471* 328    Medications:    . calcitRIOL      . heparin      . midodrine      . sodium chloride   Intravenous Once  . allopurinol  100 mg Oral Once per day on Mon Wed Fri  . amiodarone  200 mg Oral Daily  . vitamin C  500 mg Oral Daily  . bictegravir-emtricitabine-tenofovir AF  1 tablet Oral Daily  .  brimonidine  1 drop Left Eye BID  . calcitRIOL  0.5 mcg Oral Q M,W,F-HD  . Chlorhexidine Gluconate Cloth  6 each Topical Q0600  . dexamethasone (DECADRON) injection  6 mg Intravenous Q24H  . dorzolamide-timolol  1 drop Left Eye BID  . famotidine  10 mg Oral Daily  . feeding supplement (ENSURE ENLIVE)  237 mL Oral TID BM  . feeding supplement (PRO-STAT SUGAR FREE 64)  30 mL Oral BID  . insulin aspart  0-9 Units Subcutaneous Q4H  . latanoprost  1 drop Left Eye QHS  . midodrine  10 mg Oral Q M,W,F-HD  . montelukast  10 mg Oral QHS  . multivitamin  1 tablet Oral QHS  . sucroferric oxyhydroxide  1,000 mg Oral TID WC  . Warfarin - Pharmacist Dosing Inpatient   Does not apply q1800  . zinc sulfate  220 mg Oral Daily      Otelia Santee, MD 12/24/2019, 8:09 AM

## 2019-12-24 NOTE — Progress Notes (Signed)
Calorie Count Note: Day 1  48-hour calorie count ordered. Calorie count was started yesterday at breakfast meal and will extend through dinner meal today. Day 1 results are below. RD will provide results of Day 2 tomorrow.  Diet: Renal with 1200 mL fluid restriction Supplements: - Ensure Enlive po TID, each supplement provides 350 kcal and 20 grams of protein - Pro-stat 30 ml po BID, each supplement provides 100 kcal and 15 grams of protein  Day 1, 12/23/19: Breakfast: 365 kcal, 12 grams protein Lunch: 346 kcal, 13 grams protein Dinner: no information available Supplements: 1070 kcal, 52 grams of protein  Total intake: 1781 kcal (85% of minimum estimated needs)  77 grams protein (73% of minimum estimated needs)  Nutrition Diagnosis: Inadequate oral intake related to poor appetite, vomiting as evidenced by meal completion < 25%.  Goal: Patient will meet greater than or equal to 90% of their needs.  Intervention:  - Continue 48-hour calorie count - Ensure Enlive TID - Pro-stat BID - renal MVI daily   Gaynell Face, MS, RD, LDN Inpatient Clinical Dietitian Pager: [phone] Weekend/After Hours: [phone]

## 2019-12-25 LAB — CBC
HCT: 20.6 % — ABNORMAL LOW (ref 39.0–52.0)
Hemoglobin: 7 g/dL — ABNORMAL LOW (ref 13.0–17.0)
MCH: 35.7 pg — ABNORMAL HIGH (ref 26.0–34.0)
MCHC: 34 g/dL (ref 30.0–36.0)
MCV: 105.1 fL — ABNORMAL HIGH (ref 80.0–100.0)
Platelets: 282 10*3/uL (ref 150–400)
RBC: 1.96 MIL/uL — ABNORMAL LOW (ref 4.22–5.81)
RDW: 14.6 % (ref 11.5–15.5)
WBC: 16.1 10*3/uL — ABNORMAL HIGH (ref 4.0–10.5)
nRBC: 0.1 % (ref 0.0–0.2)

## 2019-12-25 LAB — BASIC METABOLIC PANEL
Anion gap: 18 — ABNORMAL HIGH (ref 5–15)
BUN: 107 mg/dL — ABNORMAL HIGH (ref 8–23)
CO2: 24 mmol/L (ref 22–32)
Calcium: 8.8 mg/dL — ABNORMAL LOW (ref 8.9–10.3)
Chloride: 92 mmol/L — ABNORMAL LOW (ref 98–111)
Creatinine, Ser: 9.46 mg/dL — ABNORMAL HIGH (ref 0.61–1.24)
GFR calc Af Amer: 6 mL/min — ABNORMAL LOW (ref >60)
GFR calc non Af Amer: 5 mL/min — ABNORMAL LOW (ref >60)
Glucose, Bld: 139 mg/dL — ABNORMAL HIGH (ref 70–99)
Potassium: 5 mmol/L (ref 3.5–5.1)
Sodium: 134 mmol/L — ABNORMAL LOW (ref 135–145)

## 2019-12-25 LAB — GLUCOSE, CAPILLARY
Glucose-Capillary: 139 mg/dL — ABNORMAL HIGH (ref 70–99)
Glucose-Capillary: 139 mg/dL — ABNORMAL HIGH (ref 70–99)
Glucose-Capillary: 156 mg/dL — ABNORMAL HIGH (ref 70–99)
Glucose-Capillary: 159 mg/dL — ABNORMAL HIGH (ref 70–99)
Glucose-Capillary: 166 mg/dL — ABNORMAL HIGH (ref 70–99)
Glucose-Capillary: 186 mg/dL — ABNORMAL HIGH (ref 70–99)
Glucose-Capillary: 197 mg/dL — ABNORMAL HIGH (ref 70–99)

## 2019-12-25 LAB — RETICULOCYTES
Immature Retic Fract: 26.5 % — ABNORMAL HIGH (ref 2.3–15.9)
RBC.: 1.98 MIL/uL — ABNORMAL LOW (ref 4.22–5.81)
Retic Count, Absolute: 84.9 10*3/uL (ref 19.0–186.0)
Retic Ct Pct: 4.3 % — ABNORMAL HIGH (ref 0.4–3.1)

## 2019-12-25 LAB — IRON AND TIBC
Iron: 41 ug/dL — ABNORMAL LOW (ref 45–182)
Saturation Ratios: 18 % (ref 17.9–39.5)
TIBC: 234 ug/dL — ABNORMAL LOW (ref 250–450)
UIBC: 193 ug/dL

## 2019-12-25 LAB — FOLATE: Folate: 21.9 ng/mL (ref >5.9)

## 2019-12-25 LAB — FERRITIN: Ferritin: 769 ng/mL — ABNORMAL HIGH (ref 24–336)

## 2019-12-25 LAB — D-DIMER, QUANTITATIVE: D-Dimer, Quant: 4.35 ug/mL-FEU — ABNORMAL HIGH (ref 0.00–0.50)

## 2019-12-25 LAB — C-REACTIVE PROTEIN: CRP: 1 mg/dL — ABNORMAL HIGH (ref <1.0)

## 2019-12-25 LAB — PROTIME-INR
INR: 2.1 — ABNORMAL HIGH (ref 0.8–1.2)
Prothrombin Time: 23.1 seconds — ABNORMAL HIGH (ref 11.4–15.2)

## 2019-12-25 LAB — VITAMIN B12: Vitamin B-12: 1277 pg/mL — ABNORMAL HIGH (ref 180–914)

## 2019-12-25 MED ORDER — DARBEPOETIN ALFA 200 MCG/0.4ML IJ SOSY
200.0000 ug | PREFILLED_SYRINGE | INTRAMUSCULAR | Status: DC
  Administered 2019-12-26: 200 ug via INTRAVENOUS

## 2019-12-25 MED ORDER — WARFARIN SODIUM 2.5 MG PO TABS
2.5000 mg | ORAL_TABLET | Freq: Once | ORAL | Status: AC
  Administered 2019-12-25: 2.5 mg via ORAL
  Filled 2019-12-25: qty 1

## 2019-12-25 MED ORDER — SEVELAMER CARBONATE 800 MG PO TABS
800.0000 mg | ORAL_TABLET | Freq: Three times a day (TID) | ORAL | Status: DC
  Administered 2019-12-25 (×2): 800 mg via ORAL
  Filled 2019-12-25 (×2): qty 1

## 2019-12-25 NOTE — Progress Notes (Signed)
Physical Therapy Treatment Patient Details Name: David [name] MRN: [id number] DOB: 12-14-1940 Today's Date: 12/25/2019    History of Present Illness Patient is a 79 year old male with history of combined systolic and diastolic heart failure (EF 20 to 25% 03/04/2019), iron deficiency anemia, atrial fibrillation on Coumadin only tolerates amiodarone, PEA arrest 02/2018, ESRD on HD [facility]S, GERD, HIV, prostate cancer, CVA, hypertension who presented to the ED on 1/7 with complaints of generalized weakness and tremors and not feeling well x5 days.  Found to be Covid +.    PT Comments    Pt up in chair for several hours today and willing to work with therapy before getting back to bed. With utilization of the Stedy pt is able to stand from recliner with modAx2 and from higher Stedy pad is minA to min guard for standing for brushing teeth, pt requires modAx2 for final ascent from [facility]. Pt with increased LE weakness and muscle cramping in thighs with power up, also has decreased ability for eccentric control with descent to [facility] and bed surface. Pt requires maxAx2 for return to bed. Pt is making good progress towards his goals today and PT will continue to follow acutely to progress mobility.   Follow Up Recommendations  SNF;Supervision/Assistance - 24 hour     Equipment Recommendations  Other (comment)(TBD)    Recommendations for Other Services       Precautions / Restrictions Precautions Precautions: Fall Restrictions Weight Bearing Restrictions: No    Mobility  Bed Mobility Overal bed mobility: Needs Assistance Bed Mobility: Sit to Supine Rolling: Min assist(from rt side to back)     Sit to supine: +2 for physical assistance;Max assist Sit to sidelying: +2 for physical assistance;Min assist;+2 for safety/equipment General bed mobility comments: assist to guide shoulders and for LEs into bed  Transfers Overall transfer level: Needs assistance Equipment used: Ambulation equipment  used Transfers: Sit to/from Stand Sit to Stand: +2 physical assistance;Mod assist;Min guard         General transfer comment: stood x 1 from recliner, from stedy x 5 and from [facility] x 1, requires more assist from lower surfaces to rise        Balance Overall balance assessment: Needs assistance Sitting-balance support: Feet supported Sitting balance-Leahy Scale: Poor Sitting balance - Comments: requires one hand on stedy and close guard while engaged in grooming Postural control: Right lateral lean Standing balance support: Bilateral upper extremity supported Standing balance-Leahy Scale: Poor Standing balance comment: pt requires outside support as well as B UE support on Stedy to maintain balance in standing                            Cognition Arousal/Alertness: Awake/alert Behavior During Therapy: Flat affect Overall Cognitive Status: No family/caregiver present to determine baseline cognitive functioning Area of Impairment: Following commands;Safety/judgement;Problem solving;Attention                   Current Attention Level: Selective   Following Commands: Follows one step commands with increased time Safety/Judgement: Decreased awareness of safety Awareness: Intellectual Problem Solving: Slow processing;Decreased initiation;Difficulty sequencing;Requires verbal cues;Requires tactile cues General Comments: delayed processing with need of additional cues for completion of tasks         General Comments General comments (skin integrity, edema, etc.): VSS on RA       Pertinent Vitals/Pain Pain Assessment: No/denies pain Faces Pain Scale: No hurt    Home Living Family/patient expects to  be discharged to:: Private residence                        PT Goals (current goals can now be found in the care plan section) Acute Rehab PT Goals Patient Stated Goal: to feel better  Time For Goal Achievement: 12/26/19 Potential to Achieve Goals:  Good Progress towards PT goals: Progressing toward goals    Frequency    Min 3X/week      PT Plan Current plan remains appropriate    Co-evaluation PT/OT/SLP Co-Evaluation/Treatment: Yes Reason for Co-Treatment: For patient/therapist safety PT goals addressed during session: Mobility/safety with mobility;Strengthening/ROM OT goals addressed during session: ADL's and self-care;Strengthening/ROM      AM-PAC PT "6 Clicks" Mobility   Outcome Measure  Help needed turning from your back to your side while in a flat bed without using bedrails?: A Lot Help needed moving from lying on your back to sitting on the side of a flat bed without using bedrails?: A Lot Help needed moving to and from a bed to a chair (including a wheelchair)?: A Lot Help needed standing up from a chair using your arms (e.g., wheelchair or bedside chair)?: A Lot Help needed to walk in hospital room?: Total Help needed climbing 3-5 steps with a railing? : Total 6 Click Score: 10    End of Session Equipment Utilized During Treatment: Gait belt Activity Tolerance: Patient tolerated treatment well Patient left: in bed;with call bell/phone within reach;with bed alarm set Nurse Communication: Mobility status PT Visit Diagnosis: Other abnormalities of gait and mobility (R26.89);Muscle weakness (generalized) (M62.81);Other symptoms and signs involving the nervous system (O24.235)     Time: 3614-4315 PT Time Calculation (min) (ACUTE ONLY): 23 min  Charges:  $Therapeutic Activity: 8-22 mins                     Courtnei Ruddell B. Migdalia Dk PT, DPT Acute Rehabilitation Services Pager [phone] Office [phone]    [place] 12/25/2019, 4:58 PM

## 2019-12-25 NOTE — Progress Notes (Signed)
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  Found to be Covid +.   OT comments  Pt in chair several hours and readily willing to work with therapies. Stood a total of 7 times from low surfaces and stedy. Performed oral care at sink with min assist and sat on BSC, but without a BM. Assisted pt to return to bed. Pt with improved endurance and cognition. VSS on RA.   Follow Up Recommendations  SNF;Supervision/Assistance - 24 hour    Equipment Recommendations  None recommended by OT    Recommendations for Other Services      Precautions / Restrictions Precautions Precautions: Fall Restrictions Weight Bearing Restrictions: No       Mobility Bed Mobility Overal bed mobility: Needs Assistance Bed Mobility: Sit to Supine Rolling: Min assist(from rt side to back)     Sit to supine: +2 for physical assistance;Max assist Sit to sidelying: +2 for physical assistance;Min assist;+2 for safety/equipment General bed mobility comments: assist to guide shoulders and for LEs into bed  Transfers Overall transfer level: Needs assistance Equipment used: Ambulation equipment used Transfers: Sit to/from Stand Sit to Stand: +2 physical assistance;Mod assist;Min guard         General transfer comment: stood x 1 from recliner, from stedy x 5 and from [facility] x 1, requires more assist from lower surfaces to rise    Balance Overall balance assessment: Needs assistance Sitting-balance support:  Feet supported Sitting balance-Leahy Scale: Poor Sitting balance - Comments: requires one hand on stedy and close guard while engaged in grooming Postural control: Right lateral lean Standing balance support: Bilateral upper extremity supported Standing balance-Leahy Scale: Poor Standing balance comment: pt requires outside support as well as B UE support on Stedy to maintain balance in standing                           ADL either performed or assessed with clinical judgement   ADL Overall ADL's : Needs assistance/impaired     Grooming: Oral care;Standing;Sitting Grooming Details (indicate cue type and reason): oral care with set up of tooth brush sitting on stedy, stood x 2 to spit                 Toilet Transfer: Total assistance;+2 for physical assistance;BSC Toilet Transfer Details (indicate cue type and reason): used stedy to transfer pt Toileting- Water quality scientist and Hygiene: Sit to/from stand;Total assistance;+2 for physical assistance         General ADL Comments: pt initiating need to have BM     Vision       Perception     Praxis      Cognition Arousal/Alertness: Awake/alert Behavior During Therapy: Flat affect Overall Cognitive Status: No family/caregiver present to determine baseline cognitive functioning Area of Impairment: Following commands;Safety/judgement;Problem solving;Attention                   Current Attention Level: Selective   Following Commands: Follows one step commands with increased time Safety/Judgement: Decreased  awareness of safety Awareness: Intellectual Problem Solving: Slow processing;Decreased initiation;Difficulty sequencing;Requires verbal cues;Requires tactile cues General Comments: delayed processing with need of additional cues for completion of tasks        Exercises     Shoulder Instructions       General Comments      Pertinent Vitals/ Pain       Pain Assessment: No/denies  pain Faces Pain Scale: No hurt  Home Living Family/patient expects to be discharged to:: Private residence                                        Prior Functioning/Environment              Frequency  Min 2X/week        Progress Toward Goals  OT Goals(current goals can now be found in the care plan section)  Progress towards OT goals: Progressing toward goals  Acute Rehab OT Goals Patient Stated Goal: to feel better  OT Goal Formulation: With patient Time For Goal Achievement: 01/01/20 Potential to Achieve Goals: Good  Plan Discharge plan remains appropriate    Co-evaluation    PT/OT/SLP Co-Evaluation/Treatment: Yes Reason for Co-Treatment: For patient/therapist safety   OT goals addressed during session: ADL's and self-care;Strengthening/ROM      AM-PAC OT "6 Clicks" Daily Activity     Outcome Measure   Help from another person eating meals?: A Little Help from another person taking care of personal grooming?: A Little Help from another person toileting, which includes using toliet, bedpan, or urinal?: Total Help from another person bathing (including washing, rinsing, drying)?: A Lot Help from another person to put on and taking off regular upper body clothing?: A Lot Help from another person to put on and taking off regular lower body clothing?: Total 6 Click Score: 12    End of Session Equipment Utilized During Treatment: Gait belt  OT Visit Diagnosis: Unsteadiness on feet (R26.81);Cognitive communication deficit (R41.841);Muscle weakness (generalized) (M62.81)   Activity Tolerance Patient tolerated treatment well   Patient Left in bed;with call bell/phone within reach;with bed alarm set   Nurse Communication          Time: 4497-5300 OT Time Calculation (min): 23 min  Charges: OT General Charges $OT Visit: 1 Visit OT Treatments $Self Care/Home Management : 8-22 mins  Nestor Lewandowsky, OTR/L Acute Rehabilitation  Services Pager: [phone] Office: [phone]   Malka So 12/25/2019, 4:32 PM

## 2019-12-25 NOTE — Progress Notes (Addendum)
PROGRESS NOTE    [facility]  CBU:[id number] DOB: 1941/06/16 DOA: 12/11/2019 PCP: [name], NP   Brief Narrative:  The patient is a 79 year old male with multiple comorbidities including end-stage combined systolic and diastolic heart failure, EF of 20%, ESRD on hemodialysis,, iron deficiency anemia, atrial fibrillation on Coumadin and amiodarone, PEA arrest March 2019, ESRD on HD, GERD, HIV, prostate cancer, CVA, hypertension as well as other comorbidities who presented to the ED on 1/7 with chief complaint generalized weakness, tremors from dialysis (did not complete treatment) and found to be Covid +1/7.   -Initially not started on any Covid treatment as he was not hypoxic however had increasing respiratory rate and CRP 22.9 on 1/8 and so was started on dexamethasone and remdesivir.  -Cardiology consulted for elevated troponin but not deemed a candidate for cardiac intervention, likely demand ischemia and no therapies recommended.   -Nephrology was consulted due to ESRD and palliative care team consulted and patient is now a DNR -Hospital course complicated by ongoing failure to thrive, poor p.o. intake, palliative conversations ongoing -plan for SNF with Palliative care FU  Assessment & Plan:   COVID-19 virus infection -On admission: Febrile, lactic acidosis. increased respiratory rate to >30 -Completed 4 days dexamethasone, stopped 1/11 due to uremia (BUN 99) and tolerating room air but this was resumed 1/14 as inflammatory markers started trending upwards, since discontinued -Remdesivir course Completed -improved and stable from this standpoint -Wean O2  Nausea and vomiting -Improved with supportive care, could be secondary to above or uremia  Elevated Troponin with extensive cardiac history End-stage heart failure, systolic and diastolic CHF History of PEA arrest Cardiomyopathy, EF of 20% -Troponin255->271 on admission -Asymptomatic with baseline LBBB on  EKG -Cardiology consulted-no indication for cardiac intervention and recommended end-stage heart failure and recommending optimizing volume status with hemodialysis -Palliative care consulted, as above goals of care conversation had and he is now DNR -Poor prognosis, see Almyra Deforest, PA note from 12/10/19 for complete details  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 Eden Lathe on 11/10/2019, unfortunately this thrombosed and underwent thrombectomy by Dr. Carlis Abbott on 12/14. Eventually underwent a right SFA to common femoral vein AV loop graft by Dr. 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 every 6 as needed severe pain  History of HIV  -[name]  PreDiabetes -CBG's ranging 152-192 -C/w Insulin Aspart 0-9 Sensitive Scale q4h  Ambulatory Dysfunction -Uses cane and walker at baseline -PT/OT recommending SNF -Discharge planning  DVT prophylaxis: Anticoagulated with Coumadin Code Status: DO NOT RESUSCITATE  Family Communication: No family present at bedside will update wife  Disposition Plan: SNF when bed available  Consultants:   Cardiology  Nephrology   Palliative Care   Procedures: VENOUS DUPLEX Upper and Lower  Upper showed No DVT and Lower showed No DVT   Antimicrobials: Anti-infectives (From admission, onward)   Start     Dose/Rate Route Frequency Ordered Stop   12/19/19 1500  vancomycin (VANCOREADY) IVPB 1500 mg/300 mL  1,500 mg 150 mL/hr over 120 Minutes  Intravenous  Once 12/19/19 1446 12/19/19 1750   12/18/19 2000  bictegravir-emtricitabine-tenofovir AF ([name]) 50-200-25 MG per tablet 1 tablet     1 tablet Oral Daily 12/17/19 1514     12/13/19 1000  remdesivir 100 mg in sodium chloride 0.9 % 100 mL IVPB     100 mg 200 mL/hr over 30 Minutes Intravenous Every 24 hours 12/12/19 0724 12/16/19 1152   12/12/19 1000  bictegravir-emtricitabine-tenofovir AF ([name]) 50-200-25 MG per tablet 1 tablet  Status:  Discontinued     1 tablet Oral Daily 12/11/19 2024 12/17/19 1514   12/12/19 0800  remdesivir 200 mg in sodium chloride 0.9% 250 mL IVPB     200 mg 580 mL/hr over 30 Minutes Intravenous Once 12/12/19 0724 12/12/19 1253     Subjective: -No events overnight, eating better according to nurses  Objective: Vitals:   12/25/19 0400 12/25/19 0800 12/25/19 1221 12/25/19 1308  BP: 120/65 124/61 (!) 157/77 (!) 141/67  Pulse: 73 67 77 69  Resp: (!) 23 14 (!) 21 15  Temp: 97.8 F (36.6 C)   (!) 97.4 F (36.3 C)  TempSrc: Oral   Oral  SpO2: 100% 100% 93% 98%  Weight:      Height:        Intake/Output Summary (Last 24 hours) at 12/25/2019 1516 Last data filed at 12/24/2019 2000 Gross per 24 hour  Intake 474 ml  Output --  Net 474 ml   Filed Weights   12/24/19 0450 12/24/19 0805 12/24/19 1108  Weight: 81.9 kg 80.2 kg 79.2 kg   Examination: Physical Exam:  Gen: Elderly chronically ill-appearing male laying in bed, no distress, AAO x2 HEENT:  no JVD Lungs: Diminished breath sounds CVS: RRR,No Gallops,Rubs or new Murmurs Abd: soft, Non tender, non distended, BS present Extremities: Trace edema  skin: no new rashes  Data Reviewed: I have personally reviewed following labs and imaging studies  CBC: Recent Labs  Lab 12/20/19 0741 12/20/19 0741 12/21/19 0500 12/22/19 1152 12/23/19 0414 12/24/19 0706 12/25/19 0613  WBC 5.3   < > 7.3 12.3* 9.3 13.6* 16.1*  NEUTROABS 4.4  --  6.5 10.7* 8.6* 12.4*  --   HGB 8.0*   < > 7.9* 8.7*  7.5* 7.1* 7.0*  HCT 23.9*   < > 23.9* 25.6* 22.8* 21.6* 20.6*  MCV 104.4*   < > 105.3* 102.4* 105.6* 104.3* 105.1*  PLT 335   < > 345 471* 328 281 282   < > = values in this interval not displayed.   Basic Metabolic Panel: Recent Labs  Lab 12/20/19 0741 12/20/19 0741 12/21/19 0500 12/22/19 1152 12/23/19 0414 12/24/19 0706 12/25/19 0613  NA 138   < > 137 135 137 132* 134*  K 4.8   < > 5.2* 3.4* 4.6 4.7 5.0  CL 98   < > 96* 92* 95* 90* 92*  CO2 24   < > 22 26 25 23 24   GLUCOSE 146*   < > 145* 119* 199* 155* 139*  BUN 69*   < > 102* 48* 68* 107* 107*  CREATININE 7.98*   < > 9.83* 4.59* 7.20* 10.03* 9.46*  CALCIUM 8.4*   < > 8.8* 8.5* 8.5* 8.9 8.8*  MG 2.3  --  2.5* 2.2 2.3 2.5*  --   PHOS 9.7*  --  10.0* 3.9 6.7* 8.0*  --    < > = values in this interval not displayed.   GFR: Estimated  Creatinine Clearance: 7.3 mL/min (A) (by C-G formula based on SCr of 9.46 mg/dL (H)). Liver Function Tests: Recent Labs  Lab 12/20/19 0741 12/21/19 0500 12/22/19 1152 12/23/19 0414 12/24/19 0706  AST 18 20 20 21 16   ALT 19 25 27 26 22   ALKPHOS 68 73 101 87 90  BILITOT 0.8 0.8 0.6 0.3 0.9  PROT 5.6* 5.6* 6.5 5.5* 5.2*  ALBUMIN 2.1* 2.4* 2.8* 2.4* 2.4*   No results for input(s): LIPASE, AMYLASE in the last 168 hours. No results for input(s): AMMONIA in the last 168 hours. Coagulation Profile: Recent Labs  Lab 12/21/19 0924 12/22/19 0406 12/23/19 0414 12/24/19 0706 12/25/19 0613  INR 3.2* 2.9* 1.9* 1.8* 2.1*   Cardiac Enzymes: No results for input(s): CKTOTAL, CKMB, CKMBINDEX, TROPONINI in the last 168 hours. BNP (last 3 results) No results for input(s): PROBNP in the last 8760 hours. HbA1C: No results for input(s): HGBA1C in the last 72 hours. CBG: Recent Labs  Lab 12/24/19 2018 12/25/19 0032 12/25/19 0449 12/25/19 0815 12/25/19 1150  GLUCAP 197* 166* 156* 139* 159*   Lipid Profile: No results for input(s): CHOL, HDL, LDLCALC, TRIG, CHOLHDL, LDLDIRECT in the last 72  hours. Thyroid Function Tests: No results for input(s): TSH, T4TOTAL, FREET4, T3FREE, THYROIDAB in the last 72 hours. Anemia Panel: Recent Labs    12/24/19 0706 12/25/19 0613  FERRITIN 737* 769*   Sepsis Labs: No results for input(s): PROCALCITON, LATICACIDVEN in the last 168 hours.  No results found for this or any previous visit (from the past 240 hour(s)).   RN Pressure Injury Documentation:     Estimated body mass index is 22.42 kg/m as calculated from the following:   Height as of this encounter: 6\' 2"  (1.88 m).   Weight as of this encounter: 79.2 kg.  Malnutrition Type:  Nutrition Problem: Inadequate oral intake Etiology: poor appetite, vomiting   Malnutrition Characteristics:  Signs/Symptoms: meal completion < 25%   Nutrition Interventions:  Interventions: Nepro shake, MVI, Prostat, Liberalize Diet   Radiology Studies: DG CHEST PORT 1 VIEW  Result Date: 12/24/2019 CLINICAL DATA:  Shortness of breath, dialysis EXAM: PORTABLE CHEST 1 VIEW COMPARISON:  12/23/2019 FINDINGS: Cardiomegaly. Large or right chest multi lumen vascular catheter. Both lungs are clear. The visualized skeletal structures are unremarkable. IMPRESSION: Cardiomegaly without acute abnormality of the lungs in AP portable projection. Electronically Signed   By: Eddie Candle M.D.   On: 12/24/2019 08:25   Scheduled Meds: . allopurinol  100 mg Oral Once per day on Mon Wed Fri  . amiodarone  200 mg Oral Daily  . vitamin C  500 mg Oral Daily  . bictegravir-emtricitabine-tenofovir AF  1 tablet Oral Daily  . brimonidine  1 drop Left Eye BID  . calcitRIOL  0.5 mcg Oral Q M,W,F-HD  . Chlorhexidine Gluconate Cloth  6 each Topical Q0600  . [START ON 12/26/2019] darbepoetin (ARANESP) injection - DIALYSIS  200 mcg Intravenous Q Fri-HD  . dorzolamide-timolol  1 drop Left Eye BID  . famotidine  10 mg Oral Daily  . feeding supplement (ENSURE ENLIVE)  237 mL Oral TID BM  . feeding supplement (PRO-STAT SUGAR  FREE 64)  30 mL Oral BID  . insulin aspart  0-9 Units Subcutaneous Q4H  . latanoprost  1 drop Left Eye QHS  . midodrine  10 mg Oral Q M,W,F-HD  . montelukast  10 mg Oral QHS  . multivitamin  1 tablet Oral QHS  . sevelamer carbonate  800 mg Oral TID WC  .  sucroferric oxyhydroxide  1,000 mg Oral TID WC  . warfarin  2.5 mg Oral ONCE-1800  . Warfarin - Pharmacist Dosing Inpatient   Does not apply q1800  . zinc sulfate  220 mg Oral Daily   Continuous Infusions:   LOS: 14 days   Domenic Polite, MD

## 2019-12-25 NOTE — Progress Notes (Addendum)
[facility] Progress Note   OP HD: [facility] (MWF here) 4h 400/1.5 83.5kg 2/2 bath P4 AVG thigh Hep 2000 - Mircera 42mcg IV q 2 weeks (last 12/06/19) - Calcitriol 0.46mcg PO q HD  Assessment/ Plan:   1)End-stage renal disease -outpt HD is MTTS schedule. Having nausea/ taste issues, presumably due to uremia.Bec of hospital ESRD census will try MWF with daily evaluation for improvement and if not better then will HD Sat as well given that there are a lot of pts on TTS schedule. I will check on the pt census fri afternoon and sat AM. If there is availability then will HD Sat as well.  Plan next [place]; 1L net UF 1/17, 1L net 1/19  2)Nausea/ poor po intake - on Zofran  3)COVID-19 infection -Therapies per primary team - on IV dexamethasone and sp remdesivir  4)Combined chronic systolic and diastolic chronic end-stage heart failure (20% EF)  - optimize volume with HD as able -Status post cardiology evaluation with little therapy to add given his hypotension - is 4kg under dry wt; will certainly have a new EDW  5)DNR - appreciate pall care assistance  6)Chronic hypotension - onmidodrine  7)Anemia secondary to CKD - On ESA-last dosed on 1/2 as above - had blood loss from HD cathat HDFriday, HD cath checked and therewas no crackso HD completed;gave IV vancx1. Hb dropped to 7.9. given 1U 1/15 - Will dose aranesp 200 (1/22)  8)Secondary hyperparathyroidism -Will continue calcitriol inpatient and will be administered at his dialysis unit upon discharge.renal diet. On velphoro-> incr to 1gm TIDM. Recheck phos on fri. Added renvela 1 tidm as well. Will recheck on sun.  9)H/o CVA  10)HIV - on meds  11)Atrial fibrillation - appears chronic  Subjective:   Feels that his breathing is much better. No n/v/dyspnea/f overnight. Asking about his heart and dialysis; educated him.   Objective:   BP 120/65 (BP Location: Left  Arm)   Pulse 73   Temp 97.8 F (36.6 C) (Oral)   Resp (!) 23   Ht 6\' 2"  (1.88 m)   Wt 79.2 kg   SpO2 100%   BMI 22.42 kg/m   Intake/Output Summary (Last 24 hours) at 12/25/2019 2706 Last data filed at 12/24/2019 2000 Gross per 24 hour  Intake 574 ml  Output 1000 ml  Net -426 ml   Weight change: -1.7 kg  Physical Exam: GEN: NAD, A&Ox3, NCAT HEENT: No conjunctival pallor, EOMI NECK: Supple, no thyromegaly LUNGS: CTA B/L no rales, rhonchi or wheezing CB:JSEGB irreg ABD: SNDNT +BS  EXT: No lower extremity edema ACCESS: rt thigh AVG +pulse, RIJ TC  Imaging: DG CHEST PORT 1 VIEW  Result Date: 12/24/2019 CLINICAL DATA:  Shortness of breath, dialysis EXAM: PORTABLE CHEST 1 VIEW COMPARISON:  12/23/2019 FINDINGS: Cardiomegaly. Large or right chest multi lumen vascular catheter. Both lungs are clear. The visualized skeletal structures are unremarkable. IMPRESSION: Cardiomegaly without acute abnormality of the lungs in AP portable projection. Electronically Signed   By: Eddie Candle M.D.   On: 12/24/2019 08:25   DG CHEST PORT 1 VIEW  Result Date: 12/23/2019 CLINICAL DATA:  Tremors, shortness of breath EXAM: PORTABLE CHEST 1 VIEW COMPARISON:  12/18/2019 FINDINGS: Improving bilateral lower lung hazy airspace disease. No pleural effusion or pneumothorax. Stable cardiomegaly. Large bore right jugular central venous catheter with the tip projecting over the cavoatrial junction. No acute osseous abnormality. IMPRESSION: Stable cardiomegaly. Improving bilateral lower lung hazy airspace disease. Electronically Signed   By: Elbert Ewings  Patel   On: 12/23/2019 08:58    Labs: BMET Recent Labs  Lab 12/19/19 0346 12/20/19 0741 12/21/19 0500 12/22/19 1152 12/23/19 0414 12/24/19 0706  NA 136 138 137 135 137 132*  K 5.3* 4.8 5.2* 3.4* 4.6 4.7  CL 92* 98 96* 92* 95* 90*  CO2 23 24 22 26 25 23   GLUCOSE 172* 146* 145* 119* 199* 155*  BUN 110* 69* 102* 48* 68* 107*  CREATININE 12.00* 7.98* 9.83*  4.59* 7.20* 10.03*  CALCIUM 8.5* 8.4* 8.8* 8.5* 8.5* 8.9  PHOS 10.0* 9.7* 10.0* 3.9 6.7* 8.0*   CBC Recent Labs  Lab 12/21/19 0500 12/21/19 0500 12/22/19 1152 12/23/19 0414 12/24/19 0706 12/25/19 0613  WBC 7.3   < > 12.3* 9.3 13.6* 16.1*  NEUTROABS 6.5  --  10.7* 8.6* 12.4*  --   HGB 7.9*   < > 8.7* 7.5* 7.1* 7.0*  HCT 23.9*   < > 25.6* 22.8* 21.6* 20.6*  MCV 105.3*   < > 102.4* 105.6* 104.3* 105.1*  PLT 345   < > 471* 328 281 282   < > = values in this interval not displayed.    Medications:    . allopurinol  100 mg Oral Once per day on Mon Wed Fri  . amiodarone  200 mg Oral Daily  . vitamin C  500 mg Oral Daily  . bictegravir-emtricitabine-tenofovir AF  1 tablet Oral Daily  . brimonidine  1 drop Left Eye BID  . calcitRIOL  0.5 mcg Oral Q M,W,F-HD  . Chlorhexidine Gluconate Cloth  6 each Topical Q0600  . dorzolamide-timolol  1 drop Left Eye BID  . famotidine  10 mg Oral Daily  . feeding supplement (ENSURE ENLIVE)  237 mL Oral TID BM  . feeding supplement (PRO-STAT SUGAR FREE 64)  30 mL Oral BID  . insulin aspart  0-9 Units Subcutaneous Q4H  . latanoprost  1 drop Left Eye QHS  . midodrine  10 mg Oral Q M,W,F-HD  . montelukast  10 mg Oral QHS  . multivitamin  1 tablet Oral QHS  . sucroferric oxyhydroxide  1,000 mg Oral TID WC  . Warfarin - Pharmacist Dosing Inpatient   Does not apply q1800  . zinc sulfate  220 mg Oral Daily      Otelia Santee, MD 12/25/2019, 8:06 AM

## 2019-12-25 NOTE — Progress Notes (Signed)
ANTICOAGULATION CONSULT NOTE - Follow Up Consult  Pharmacy Consult for Warfarin Indication: atrial fibrillation  Allergies  Allergen Reactions  . Dextromethorphan-Guaifenesin     UNKNOWN   . Tocotrienols     UNKNOWN   . Losartan Potassium Other (See Comments)    Causes constipation    Patient Measurements: Height: 6\' 2"  (188 cm) Weight: 174 lb 9.7 oz (79.2 kg) IBW/kg (Calculated) : 82.2  Vital Signs: Temp: 97.8 F (36.6 C) (01/21 0400) Temp Source: Oral (01/21 0400) BP: 124/61 (01/21 0800) Pulse Rate: 67 (01/21 0800)  Labs: Recent Labs    12/23/19 0414 12/23/19 0414 12/24/19 0706 12/25/19 0613  HGB 7.5*   < > 7.1* 7.0*  HCT 22.8*  --  21.6* 20.6*  PLT 328  --  281 282  LABPROT 21.5*  --  20.4* 23.1*  INR 1.9*  --  1.8* 2.1*  CREATININE 7.20*  --  10.03* 9.46*   < > = values in this interval not displayed.   Assessment: 79 yo M on warfarin for Afib (CHADS2VASc = 8) admitted 12/11/19 for COVID and elevated D-Dimer > 5 on admission. Of note, pt had INR 1.2 on 1/6 and coumadin clinic instructed him to take 10 mg that day then resume previous dose.   INR up to 2.1, back into therapeutic range again after warfarin 5 mg the last 2 days.  Warfarin previously held 1/12-17 due to supratherapetic INRs, likely due to decreased PO intake, which is now improving.  Bleeding episode was reported in HD on 1/15. After episode, patient received 2 pRBC. H/H has trended down to Hgb 7.0. Platelets stable.  Aranesp to be given with HD on 1/22  PTA warfarin dose 2.5 mg on Mon + Fri and 5 mg all other days  Goal of Therapy:  INR 2-3 Monitor platelets by anticoagulation protocol: Yes   Plan:   Warfarin 2.5 mg today  Daily PT/INR  Monitor for s/sx bleeding.  Arty Baumgartner, [place] Phone: [phone] 12/25/2019,10:56 AM

## 2019-12-25 NOTE — Progress Notes (Signed)
Calorie Count Note: Day 2  48-hour calorie count ordered. Calorie count was started on 12/23/19 at breakfast meal and extended through dinner meal on 12/24/19. Day 2 results are below.  Diet: Renal with 1200 mL fluid restriction Supplements: - Ensure Enlive po TID, each supplement provides 350 kcal and 20 grams of protein - Pro-stat 30 ml po BID, each supplement provides 100 kcal and 15 grams of protein  Day 2, 12/24/19: Breakfast: 318 kcal, 8 grams protein Lunch: 358 kcal, 23 grams protein Dinner: 428 kcal, 19 grams protein Supplements: 700 kcal, 40 grams of protein  Total intake: 1804 kcal (86% of minimum estimated needs)  90 grams protein (86% of minimum estimated needs)  Nutrition Diagnosis: Inadequate oral intakerelated to poor appetite, vomitingas evidenced by meal completion < 25%.  Goal: Patient will meet greater than or equal to 90% of their needs.  Intervention:  - d/c 48-hour calorie count - Continue Ensure Enlive TID - Continue Pro-stat BID - Continue renal MVI daily - Hold off on Cortrak NG tube for tube feeds   Gaynell Face, MS, RD, LDN Inpatient Clinical Dietitian Pager: [phone] Weekend/After Hours: [phone]

## 2019-12-26 LAB — BASIC METABOLIC PANEL
Anion gap: 20 — ABNORMAL HIGH (ref 5–15)
BUN: 140 mg/dL — ABNORMAL HIGH (ref 8–23)
CO2: 23 mmol/L (ref 22–32)
Calcium: 8.8 mg/dL — ABNORMAL LOW (ref 8.9–10.3)
Chloride: 90 mmol/L — ABNORMAL LOW (ref 98–111)
Creatinine, Ser: 11.49 mg/dL — ABNORMAL HIGH (ref 0.61–1.24)
GFR calc Af Amer: 4 mL/min — ABNORMAL LOW (ref >60)
GFR calc non Af Amer: 4 mL/min — ABNORMAL LOW (ref >60)
Glucose, Bld: 108 mg/dL — ABNORMAL HIGH (ref 70–99)
Potassium: 5.6 mmol/L — ABNORMAL HIGH (ref 3.5–5.1)
Sodium: 133 mmol/L — ABNORMAL LOW (ref 135–145)

## 2019-12-26 LAB — CBC
HCT: 21.8 % — ABNORMAL LOW (ref 39.0–52.0)
Hemoglobin: 7.4 g/dL — ABNORMAL LOW (ref 13.0–17.0)
MCH: 35.7 pg — ABNORMAL HIGH (ref 26.0–34.0)
MCHC: 33.9 g/dL (ref 30.0–36.0)
MCV: 105.3 fL — ABNORMAL HIGH (ref 80.0–100.0)
Platelets: 298 10*3/uL (ref 150–400)
RBC: 2.07 MIL/uL — ABNORMAL LOW (ref 4.22–5.81)
RDW: 14.8 % (ref 11.5–15.5)
WBC: 17 10*3/uL — ABNORMAL HIGH (ref 4.0–10.5)
nRBC: 0.1 % (ref 0.0–0.2)

## 2019-12-26 LAB — PROTIME-INR
INR: 2.1 — ABNORMAL HIGH (ref 0.8–1.2)
Prothrombin Time: 23.6 seconds — ABNORMAL HIGH (ref 11.4–15.2)

## 2019-12-26 LAB — GLUCOSE, CAPILLARY
Glucose-Capillary: 118 mg/dL — ABNORMAL HIGH (ref 70–99)
Glucose-Capillary: 119 mg/dL — ABNORMAL HIGH (ref 70–99)
Glucose-Capillary: 133 mg/dL — ABNORMAL HIGH (ref 70–99)
Glucose-Capillary: 134 mg/dL — ABNORMAL HIGH (ref 70–99)
Glucose-Capillary: 137 mg/dL — ABNORMAL HIGH (ref 70–99)
Glucose-Capillary: 163 mg/dL — ABNORMAL HIGH (ref 70–99)

## 2019-12-26 LAB — C-REACTIVE PROTEIN: CRP: 1 mg/dL — ABNORMAL HIGH (ref <1.0)

## 2019-12-26 LAB — FERRITIN: Ferritin: 955 ng/mL — ABNORMAL HIGH (ref 24–336)

## 2019-12-26 LAB — D-DIMER, QUANTITATIVE: D-Dimer, Quant: 4.76 ug/mL-FEU — ABNORMAL HIGH (ref 0.00–0.50)

## 2019-12-26 MED ORDER — INSULIN ASPART 100 UNIT/ML ~~LOC~~ SOLN
0.0000 [IU] | Freq: Three times a day (TID) | SUBCUTANEOUS | Status: DC
  Administered 2019-12-29 – 2019-12-30 (×2): 1 [IU] via SUBCUTANEOUS
  Administered 2019-12-31: 2 [IU] via SUBCUTANEOUS
  Administered 2020-01-01 – 2020-01-07 (×4): 1 [IU] via SUBCUTANEOUS
  Administered 2020-01-09: 2 [IU] via SUBCUTANEOUS

## 2019-12-26 MED ORDER — SEVELAMER CARBONATE 800 MG PO TABS
2400.0000 mg | ORAL_TABLET | Freq: Three times a day (TID) | ORAL | Status: DC
  Administered 2019-12-26 – 2020-01-09 (×37): 2400 mg via ORAL
  Filled 2019-12-26 (×37): qty 3

## 2019-12-26 MED ORDER — DARBEPOETIN ALFA 200 MCG/0.4ML IJ SOSY
PREFILLED_SYRINGE | INTRAMUSCULAR | Status: AC
  Filled 2019-12-26: qty 0.4

## 2019-12-26 MED ORDER — WARFARIN SODIUM 2.5 MG PO TABS
2.5000 mg | ORAL_TABLET | Freq: Once | ORAL | Status: AC
  Administered 2019-12-26: 2.5 mg via ORAL
  Filled 2019-12-26: qty 1

## 2019-12-26 MED ORDER — HEPARIN SODIUM (PORCINE) 1000 UNIT/ML IJ SOLN
INTRAMUSCULAR | Status: AC
  Filled 2019-12-26: qty 4

## 2019-12-26 MED ORDER — SODIUM CHLORIDE 0.9 % IV SOLN
510.0000 mg | Freq: Once | INTRAVENOUS | Status: AC
  Administered 2019-12-26: 510 mg via INTRAVENOUS
  Filled 2019-12-26: qty 17

## 2019-12-26 NOTE — Progress Notes (Signed)
This RN called David [name] per patient request. Patient status update given. Questions answered. David [name] verbalized understanding. Patient pleasant and cooperative with assessment and meds. Patient drinking Ensure and states that he does not want HS snack since he is ready to go to sleep. Patient verbalizes understanding of 1200 ml fluid restriction. Patient does not want repositioned at this time. Patient resting in bed at this time, bed alarm on and call light within reach. Will continue to monitor.

## 2019-12-26 NOTE — Progress Notes (Signed)
PT Cancellation Note  Patient Details Name: David [name] MRN: [id number] DOB: 02/11/1941   Cancelled Treatment:    Reason Eval/Treat Not Completed: (P) Patient at procedure or test/unavailable Pt off floor for HD. PT will follow back for treatment this afternoon as able.  Neziah Braley B. Migdalia Dk PT, DPT Acute Rehabilitation Services Pager [phone] Office [phone]    [place] 12/26/2019, 10:44 AM

## 2019-12-26 NOTE — Progress Notes (Signed)
ANTICOAGULATION CONSULT NOTE - Follow Up Consult  Pharmacy Consult for Warfarin Indication: atrial fibrillation  Allergies  Allergen Reactions  . Dextromethorphan-Guaifenesin     UNKNOWN   . Tocotrienols     UNKNOWN   . Losartan Potassium Other (See Comments)    Causes constipation    Patient Measurements: Height: 6\' 2"  (188 cm) Weight: 182 lb 1.6 oz (82.6 kg) IBW/kg (Calculated) : 82.2  Vital Signs: Temp: 98 F (36.7 C) (01/22 0940) Temp Source: Axillary (01/22 0940) BP: 113/60 (01/22 1030) Pulse Rate: 66 (01/22 1030)  Labs: Recent Labs    12/24/19 0706 12/24/19 0706 12/25/19 0613 12/26/19 0625 12/26/19 0846  HGB 7.1*   < > 7.0*  --  7.4*  HCT 21.6*  --  20.6*  --  21.8*  PLT 281  --  282  --  298  LABPROT 20.4*  --  23.1* 23.6*  --   INR 1.8*  --  2.1* 2.1*  --   CREATININE 10.03*  --  9.46*  --  11.49*   < > = values in this interval not displayed.   Assessment: 79 yo M on warfarin for Afib (CHADS2VASc = 8) admitted 12/11/19 for COVID and elevated D-Dimer > 5 on admission. Of note, pt had INR 1.2 on 1/6 and coumadin clinic instructed him to take 10 mg that day then resume previous dose.   INR 2.1 again today.  Back into low therapeutic range on 1/21 after warfarin 5 mg x days then 2.5 mg yesterday.  Warfarin previously held 1/12-17 due to supratherapetic INRs, likely due to decreased PO intake, which is now improving.  Bleeding episode was reported in HD on 1/15. After episode, patient received 2 pRBC. Hgb 7.4, no further trend down. Platelets stable.  Aranesp 200 mcg IV given with HD today.  PTA warfarin dose 2.5 mg on Mon + Fri and 5 mg all other days  Goal of Therapy:  INR 2-3 Monitor platelets by anticoagulation protocol: Yes   Plan:   Warfarin 2.5 mg today  Daily PT/INR  Monitor for s/sx bleeding.  Arty Baumgartner, [place] Phone: [phone] 12/26/2019,10:44 AM

## 2019-12-26 NOTE — Progress Notes (Signed)
[facility] Progress Note   OP HD: [facility] (MWF here) 4h 400/1.5 83.5kg 2/2 bath P4 AVG thigh Hep 2000 - Mircera 74mcg IV q 2 weeks (last 12/06/19) - Calcitriol 0.27mcg PO q HD  Assessment/ Plan:   1)End-stage renal disease -outpt HD is [facility] schedule. Having nausea/ taste issues, presumably due to uremia.Bec of hospital ESRD census will try MWF with daily evaluation for improvement and if not better then will HD Sat as well given that there are a lot of pts on TTS schedule. I will check on the pt census fri afternoon and sat AM. If there is availability then will HD Sat as well.  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 Hb dropped to 7.9. given 1U 1/15 - Will dose aranesp 200 (1/22)  8)Secondary hyperparathyroidism -Will continue calcitriol inpatient and will be administered at his dialysis unit upon discharge.renal diet. On velphoro-> upsetting stomach (stopped) and will incr  renvela to 3 tidm as well. Will recheck on Mon.  9)H/o CVA  10)HIV - on meds  11)Atrial fibrillation - appears chronic  Subjective:   Feels that his breathing is much better.No n/v/dyspnea/f overnight. He's on RA.   Objective:   BP 128/69 (BP Location: Right Arm)   Pulse 66   Temp 97.7 F  (36.5 C) (Oral)   Resp 18   Ht 6\' 2"  (1.88 m)   Wt 82.6 kg   SpO2 98%   BMI 23.38 kg/m   Intake/Output Summary (Last 24 hours) at 12/26/2019 0730 Last data filed at 12/25/2019 1830 Gross per 24 hour  Intake 620 ml  Output --  Net 620 ml   Weight change: 2.4 kg  Physical Exam: GEN: NAD, A&Ox3, NCAT HEENT: No conjunctival pallor, EOMI NECK: Supple, no thyromegaly LUNGS: CTA B/L no rales, rhonchi or wheezing OY:DXAJO irreg ABD: SNDNT +BS  EXT: No lower extremity edema ACCESS: rt thigh AVG +pulse, RIJ TC  Imaging: DG CHEST PORT 1 VIEW  Result Date: 12/24/2019 CLINICAL DATA:  Shortness of breath, dialysis EXAM: PORTABLE CHEST 1 VIEW COMPARISON:  12/23/2019 FINDINGS: Cardiomegaly. Large or right chest multi lumen vascular catheter. Both lungs are clear. The visualized skeletal structures are unremarkable. IMPRESSION: Cardiomegaly without acute abnormality of the lungs in AP portable projection. Electronically Signed   By: Eddie Candle M.D.   On: 12/24/2019 08:25    Labs: BMET Recent Labs  Lab 12/20/19 0741 12/21/19 0500 12/22/19 1152 12/23/19 0414 12/24/19 0706 12/25/19 0613  NA 138 137 135 137 132* 134*  K 4.8 5.2* 3.4* 4.6 4.7 5.0  CL 98 96* 92* 95* 90* 92*  CO2 24 22 26 25 23 24   GLUCOSE 146* 145* 119* 199* 155* 139*  BUN 69* 102* 48* 68* 107* 107*  CREATININE 7.98* 9.83* 4.59* 7.20* 10.03* 9.46*  CALCIUM 8.4* 8.8*  8.5* 8.5* 8.9 8.8*  PHOS 9.7* 10.0* 3.9 6.7* 8.0*  --    CBC Recent Labs  Lab 12/21/19 0500 12/21/19 0500 12/22/19 1152 12/23/19 0414 12/24/19 0706 12/25/19 0613  WBC 7.3   < > 12.3* 9.3 13.6* 16.1*  NEUTROABS 6.5  --  10.7* 8.6* 12.4*  --   HGB 7.9*   < > 8.7* 7.5* 7.1* 7.0*  HCT 23.9*   < > 25.6* 22.8* 21.6* 20.6*  MCV 105.3*   < > 102.4* 105.6* 104.3* 105.1*  PLT 345   < > 471* 328 281 282   < > = values in this interval not displayed.    Medications:    . allopurinol  100 mg Oral Once per day on Mon Wed Fri  . amiodarone  200 mg  Oral Daily  . vitamin C  500 mg Oral Daily  . bictegravir-emtricitabine-tenofovir AF  1 tablet Oral Daily  . brimonidine  1 drop Left Eye BID  . calcitRIOL  0.5 mcg Oral Q M,W,F-HD  . Chlorhexidine Gluconate Cloth  6 each Topical Q0600  . darbepoetin (ARANESP) injection - DIALYSIS  200 mcg Intravenous Q Fri-HD  . dorzolamide-timolol  1 drop Left Eye BID  . famotidine  10 mg Oral Daily  . feeding supplement (ENSURE ENLIVE)  237 mL Oral TID BM  . feeding supplement (PRO-STAT SUGAR FREE 64)  30 mL Oral BID  . insulin aspart  0-9 Units Subcutaneous Q4H  . latanoprost  1 drop Left Eye QHS  . midodrine  10 mg Oral Q M,W,F-HD  . montelukast  10 mg Oral QHS  . multivitamin  1 tablet Oral QHS  . sevelamer carbonate  2,400 mg Oral TID WC  . Warfarin - Pharmacist Dosing Inpatient   Does not apply q1800  . zinc sulfate  220 mg Oral Daily      Otelia Santee, MD 12/26/2019, 7:30 AM

## 2019-12-26 NOTE — Progress Notes (Signed)
PROGRESS NOTE    [facility]  GYF:[id number] DOB: 04-Dec-1941 DOA: 12/11/2019 PCP: [name], NP   Brief Narrative:  The patient is a 79 year old male with multiple comorbidities including end-stage combined systolic and diastolic heart failure, EF of 20%, ESRD on hemodialysis,, iron deficiency anemia, atrial fibrillation on Coumadin and amiodarone, PEA arrest March 2019, ESRD on HD, GERD, HIV, prostate cancer, CVA, hypertension as well as other comorbidities who presented to the ED on 1/7 with chief complaint generalized weakness, tremors from dialysis (did not complete treatment) and found to be Covid +1/7.   -Initially not started on any Covid treatment as he was not hypoxic however had increasing respiratory rate and CRP 22.9 on 1/8 and so was started on dexamethasone and remdesivir.  -Cardiology consulted for elevated troponin but not deemed a candidate for cardiac intervention, likely demand ischemia and no therapies recommended.   -Nephrology was consulted due to ESRD and palliative care team consulted and patient is now a DNR -Hospital course complicated by ongoing failure to thrive, poor p.o. intake, palliative conversations ongoing, finally improving PO intake and now plan for SNF with Palliative care FU  Assessment & Plan:   COVID-19 virus infection -On admission: Febrile, lactic acidosis. increased respiratory rate to >30 -Completed 4 days dexamethasone, stopped 1/11 due to uremia (BUN 99) and tolerating room air but this was resumed 1/14 as inflammatory markers started trending upwards, since discontinued -5 day Remdesivir course Completed -improved and stable from this standpoint -Weaned off O2  Nausea and vomiting -Improved with supportive care, could be secondary to above or uremia  Elevated Troponin with extensive cardiac history End-stage heart failure, systolic and diastolic CHF History of PEA arrest Cardiomyopathy, EF of 20% -Troponin255->271 on  admission -Asymptomatic with baseline LBBB on EKG -Cardiology consulted-no indication for cardiac intervention and recommended end-stage heart failure and recommending optimizing volume status with hemodialysis -Palliative care consulted, as above goals of care conversation had and he is now DNR -Poor prognosis, see Almyra Deforest, PA note from 12/10/19 for complete details  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 Eden Lathe on 11/10/2019, unfortunately this thrombosed and underwent thrombectomy by Dr. Carlis Abbott on 12/14. Eventually underwent a right SFA to common femoral vein AV loop graft by Dr. 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, macrocytic anemia -On ESA, last dose 1/2 -Slight downward trend of hemoglobin noted, still close to his baseline range of 7-8, check anemia panel, no overt bleeding reported, may have just slight dilutional component, monitor after HD -Hb is stable -Iron saturation is low, give IV Iron x1  Chronic Hypotension -Continue Midodrine  PAF on Coumadin and amiodarone -continue coumadin and  Amiodarone  Chronic pain  -C/w Tramadol   History of HIV  -[name]  PreDiabetes -continue SSI  Ambulatory Dysfunction -was minimally ambulatory Uses cane and walker at baseline -PT/OT recommending SNF -Discharge planning  DVT prophylaxis: Anticoagulated with Coumadin Code Status: DO NOT RESUSCITATE  Family Communication: No family present at bedside, called and updated [name] Disposition Plan: SNF when bed available  Consultants:   Cardiology  Nephrology   Palliative Care   Procedures: VENOUS DUPLEX Upper and Lower  Upper showed No DVT and Lower showed No DVT   Antimicrobials: Anti-infectives (From admission, onward)   Start     Dose/Rate Route Frequency Ordered Stop   12/19/19 1500  vancomycin (VANCOREADY) IVPB 1500 mg/300 mL  1,500 mg 150  mL/hr over 120 Minutes Intravenous  Once 12/19/19 1446 12/19/19 1750   12/18/19 2000  bictegravir-emtricitabine-tenofovir AF ([name]) 50-200-25 MG per tablet 1 tablet     1 tablet Oral Daily 12/17/19 1514     12/13/19 1000  remdesivir 100 mg in sodium chloride 0.9 % 100 mL IVPB     100 mg 200 mL/hr over 30 Minutes Intravenous Every 24 hours 12/12/19 0724 12/16/19 1152   12/12/19 1000  bictegravir-emtricitabine-tenofovir AF ([name]) 50-200-25 MG per tablet 1 tablet  Status:  Discontinued     1 tablet Oral Daily 12/11/19 2024 12/17/19 1514   12/12/19 0800  remdesivir 200 mg in sodium chloride 0.9% 250 mL IVPB     200 mg 580 mL/hr over 30 Minutes Intravenous Once 12/12/19 0724 12/12/19 1253     Subjective: -No events overnight, eating a little better, about to leave for dialysis  Objective: Vitals:   12/26/19 1200 12/26/19 1230 12/26/19 1250 12/26/19 1325  BP: (!) 89/50 (!) 88/50 110/63 103/60  Pulse: 76 76 76 71  Resp: 18 18 18 20   Temp:   98 F (36.7 C) (!) 97.5 F (36.4 C)  TempSrc:   Axillary Oral  SpO2:   98% 98%  Weight:   81.5 kg   Height:        Intake/Output Summary (Last 24 hours) at 12/26/2019 1440 Last data filed at 12/26/2019 1250 Gross per 24 hour  Intake 180 ml  Output 1500 ml  Net -1320 ml   Filed Weights   12/24/19 1108 12/26/19 0050 12/26/19 1250  Weight: 79.2 kg 82.6 kg 81.5 kg   Examination: Physical Exam: Gen: Elderly frail chronically ill male laying in bed, AAO x2, no distress HEENT:  no JVD Lungs: Diminished breath sounds CVS: RRR,No Gallops,Rubs or new Murmurs Abd: soft, Non tender, non distended, BS present Extremities: Trace edema Skin: no new rashes Flat affect  Data Reviewed: I have personally reviewed following labs and imaging studies  CBC: Recent Labs  Lab 12/20/19 0741 12/20/19 0741 12/21/19 0500 12/21/19 0500 12/22/19 1152 12/23/19 0414 12/24/19 0706 12/25/19 0613 12/26/19 0846  WBC 5.3   < > 7.3   < > 12.3* 9.3 13.6*  16.1* 17.0*  NEUTROABS 4.4  --  6.5  --  10.7* 8.6* 12.4*  --   --   HGB 8.0*   < > 7.9*   < > 8.7* 7.5* 7.1* 7.0* 7.4*  HCT 23.9*   < > 23.9*   < > 25.6* 22.8* 21.6* 20.6* 21.8*  MCV 104.4*   < > 105.3*   < > 102.4* 105.6* 104.3* 105.1* 105.3*  PLT 335   < > 345   < > 471* 328 281 282 298   < > = values in this interval not displayed.   Basic Metabolic Panel: Recent Labs  Lab 12/20/19 0741 12/20/19 0741 12/21/19 0500 12/21/19 0500 12/22/19 1152 12/23/19 0414 12/24/19 0706 12/25/19 0613 12/26/19 0846  NA 138   < > 137   < > 135 137 132* 134* 133*  K 4.8   < > 5.2*   < > 3.4* 4.6 4.7 5.0 5.6*  CL 98   < > 96*   < > 92* 95* 90* 92* 90*  CO2 24   < > 22   < > 26 25 23 24 23   GLUCOSE 146*   < > 145*   < > 119* 199* 155* 139* 108*  BUN 69*   < > 102*   < >  48* 68* 107* 107* 140*  CREATININE 7.98*   < > 9.83*   < > 4.59* 7.20* 10.03* 9.46* 11.49*  CALCIUM 8.4*   < > 8.8*   < > 8.5* 8.5* 8.9 8.8* 8.8*  MG 2.3  --  2.5*  --  2.2 2.3 2.5*  --   --   PHOS 9.7*  --  10.0*  --  3.9 6.7* 8.0*  --   --    < > = values in this interval not displayed.   GFR: Estimated Creatinine Clearance: 6.2 mL/min (A) (by C-G formula based on SCr of 11.49 mg/dL (H)). Liver Function Tests: Recent Labs  Lab 12/20/19 0741 12/21/19 0500 12/22/19 1152 12/23/19 0414 12/24/19 0706  AST 18 20 20 21 16   ALT 19 25 27 26 22   ALKPHOS 68 73 101 87 90  BILITOT 0.8 0.8 0.6 0.3 0.9  PROT 5.6* 5.6* 6.5 5.5* 5.2*  ALBUMIN 2.1* 2.4* 2.8* 2.4* 2.4*   No results for input(s): LIPASE, AMYLASE in the last 168 hours. No results for input(s): AMMONIA in the last 168 hours. Coagulation Profile: Recent Labs  Lab 12/22/19 0406 12/23/19 0414 12/24/19 0706 12/25/19 0613 12/26/19 0625  INR 2.9* 1.9* 1.8* 2.1* 2.1*   Cardiac Enzymes: No results for input(s): CKTOTAL, CKMB, CKMBINDEX, TROPONINI in the last 168 hours. BNP (last 3 results) No results for input(s): PROBNP in the last 8760 hours. HbA1C: No results  for input(s): HGBA1C in the last 72 hours. CBG: Recent Labs  Lab 12/25/19 2016 12/26/19 0028 12/26/19 0447 12/26/19 0754 12/26/19 1324  GLUCAP 186* 137* 134* 119* 118*   Lipid Profile: No results for input(s): CHOL, HDL, LDLCALC, TRIG, CHOLHDL, LDLDIRECT in the last 72 hours. Thyroid Function Tests: No results for input(s): TSH, T4TOTAL, FREET4, T3FREE, THYROIDAB in the last 72 hours. Anemia Panel: Recent Labs    12/25/19 0613 12/25/19 1530 12/25/19 1535 12/26/19 0625  VITAMINB12  --  1,277*  --   --   FOLATE  --  21.9  --   --   FERRITIN 769*  --   --  955*  TIBC  --  234*  --   --   IRON  --  41*  --   --   RETICCTPCT  --   --  4.3*  --    Sepsis Labs: No results for input(s): PROCALCITON, LATICACIDVEN in the last 168 hours.  No results found for this or any previous visit (from the past 240 hour(s)).   RN Pressure Injury Documentation:     Estimated body mass index is 23.07 kg/m as calculated from the following:   Height as of this encounter: 6\' 2"  (1.88 m).   Weight as of this encounter: 81.5 kg.  Malnutrition Type:  Nutrition Problem: Inadequate oral intake Etiology: poor appetite, vomiting   Malnutrition Characteristics:  Signs/Symptoms: meal completion < 25%   Nutrition Interventions:  Interventions: Nepro shake, MVI, Prostat, Liberalize Diet   Radiology Studies: No results found. Scheduled Meds: . allopurinol  100 mg Oral Once per day on Mon Wed Fri  . amiodarone  200 mg Oral Daily  . vitamin C  500 mg Oral Daily  . bictegravir-emtricitabine-tenofovir AF  1 tablet Oral Daily  . brimonidine  1 drop Left Eye BID  . calcitRIOL  0.5 mcg Oral Q M,W,F-HD  . Chlorhexidine Gluconate Cloth  6 each Topical Q0600  . darbepoetin (ARANESP) injection - DIALYSIS  200 mcg Intravenous Q Fri-HD  . dorzolamide-timolol  1 drop  Left Eye BID  . famotidine  10 mg Oral Daily  . feeding supplement (ENSURE ENLIVE)  237 mL Oral TID BM  . feeding supplement  (PRO-STAT SUGAR FREE 64)  30 mL Oral BID  . heparin      . insulin aspart  0-9 Units Subcutaneous Q4H  . latanoprost  1 drop Left Eye QHS  . midodrine  10 mg Oral Q M,W,F-HD  . montelukast  10 mg Oral QHS  . multivitamin  1 tablet Oral QHS  . sevelamer carbonate  2,400 mg Oral TID WC  . warfarin  2.5 mg Oral ONCE-1800  . Warfarin - Pharmacist Dosing Inpatient   Does not apply q1800  . zinc sulfate  220 mg Oral Daily   Continuous Infusions:   LOS: 15 days   Domenic Polite, MD

## 2019-12-27 LAB — CBC
HCT: 21.7 % — ABNORMAL LOW (ref 39.0–52.0)
Hemoglobin: 7.1 g/dL — ABNORMAL LOW (ref 13.0–17.0)
MCH: 35.1 pg — ABNORMAL HIGH (ref 26.0–34.0)
MCHC: 32.7 g/dL (ref 30.0–36.0)
MCV: 107.4 fL — ABNORMAL HIGH (ref 80.0–100.0)
Platelets: 231 10*3/uL (ref 150–400)
RBC: 2.02 MIL/uL — ABNORMAL LOW (ref 4.22–5.81)
RDW: 15.4 % (ref 11.5–15.5)
WBC: 11 10*3/uL — ABNORMAL HIGH (ref 4.0–10.5)
nRBC: 0 % (ref 0.0–0.2)

## 2019-12-27 LAB — GLUCOSE, CAPILLARY
Glucose-Capillary: 89 mg/dL (ref 70–99)
Glucose-Capillary: 100 mg/dL — ABNORMAL HIGH (ref 70–99)
Glucose-Capillary: 110 mg/dL — ABNORMAL HIGH (ref 70–99)

## 2019-12-27 LAB — BASIC METABOLIC PANEL
Anion gap: 14 (ref 5–15)
BUN: 84 mg/dL — ABNORMAL HIGH (ref 8–23)
CO2: 26 mmol/L (ref 22–32)
Calcium: 8.7 mg/dL — ABNORMAL LOW (ref 8.9–10.3)
Chloride: 93 mmol/L — ABNORMAL LOW (ref 98–111)
Creatinine, Ser: 8.03 mg/dL — ABNORMAL HIGH (ref 0.61–1.24)
GFR calc Af Amer: 7 mL/min — ABNORMAL LOW (ref >60)
GFR calc non Af Amer: 6 mL/min — ABNORMAL LOW (ref >60)
Glucose, Bld: 131 mg/dL — ABNORMAL HIGH (ref 70–99)
Potassium: 4.9 mmol/L (ref 3.5–5.1)
Sodium: 133 mmol/L — ABNORMAL LOW (ref 135–145)

## 2019-12-27 LAB — PROTIME-INR
INR: 2.1 — ABNORMAL HIGH (ref 0.8–1.2)
Prothrombin Time: 23.3 seconds — ABNORMAL HIGH (ref 11.4–15.2)

## 2019-12-27 LAB — FERRITIN: Ferritin: 874 ng/mL — ABNORMAL HIGH (ref 24–336)

## 2019-12-27 LAB — C-REACTIVE PROTEIN: CRP: <0.5 mg/dL (ref <1.0)

## 2019-12-27 LAB — D-DIMER, QUANTITATIVE: D-Dimer, Quant: 5.26 ug/mL-FEU — ABNORMAL HIGH (ref 0.00–0.50)

## 2019-12-27 LAB — PREPARE RBC (CROSSMATCH)

## 2019-12-27 MED ORDER — WARFARIN SODIUM 4 MG PO TABS
4.0000 mg | ORAL_TABLET | Freq: Once | ORAL | Status: AC
  Administered 2019-12-27: 21:00:00 4 mg via ORAL
  Filled 2019-12-27: qty 1

## 2019-12-27 MED ORDER — HEPARIN SODIUM (PORCINE) 1000 UNIT/ML IJ SOLN
INTRAMUSCULAR | Status: AC
  Filled 2019-12-27: qty 4

## 2019-12-27 MED ORDER — SODIUM CHLORIDE 0.9% IV SOLUTION: Freq: Once | INTRAVENOUS | Status: AC

## 2019-12-27 NOTE — Progress Notes (Signed)
ANTICOAGULATION CONSULT NOTE - Follow Up Consult  Pharmacy Consult for Warfarin Indication: atrial fibrillation  Allergies  Allergen Reactions  . Dextromethorphan-Guaifenesin     UNKNOWN   . Tocotrienols     UNKNOWN   . Losartan Potassium Other (See Comments)    Causes constipation    Patient Measurements: Height: 6\' 2"  (188 cm) Weight: 182 lb 5.1 oz (82.7 kg) IBW/kg (Calculated) : 82.2  Vital Signs: Temp: 97.8 F (36.6 C) (01/23 0558) Temp Source: Oral (01/22 2121) BP: 119/60 (01/23 0558) Pulse Rate: 69 (01/23 0557)  Labs: Recent Labs    12/25/19 0623 12/25/19 7628 12/26/19 0625 12/26/19 0846 12/27/19 0439  HGB 7.0*   < >  --  7.4* 7.1*  HCT 20.6*  --   --  21.8* 21.7*  PLT 282  --   --  298 231  LABPROT 23.1*  --  23.6*  --  23.3*  INR 2.1*  --  2.1*  --  2.1*  CREATININE 9.46*  --   --  11.49* 8.03*   < > = values in this interval not displayed.   Assessment: 79 yo male on warfarin prior to admission for Afib (CHADS2VASc = 8) admitted on 12/11/19 for COVID and elevated D-Dimer > 5 on admission. Pharmacy consulted to dose warfarin for Afib. Warfarin previously held 1/12-17 due to supratherapetic INRs, likely due to decreased PO intake, which is now improving. Bleeding episode was reported in HD on 1/15. After episode, patient received 2 pRBC.   INR 2.1 is therapeutic but at the low end of the range (2-3). Hgb 7.1. Plt 231. No reported bleeding.   PTA warfarin dose 2.5 mg on Mon + Fri and 5 mg all other days  Goal of Therapy:  INR 2-3 Monitor platelets by anticoagulation protocol: Yes   Plan:  Warfarin 4mg  x1 today  Monitor INR, CBC and S/S of bleeding daily   Cristela Felt, PharmD PGY1 Pharmacy Resident [employer]: [phone]  12/27/2019,8:00 AM

## 2019-12-27 NOTE — Progress Notes (Signed)
   12/27/19 2000  Family/Significant Other Communication  Family/Significant Other Update Called;Updated (wife, [name] updated on plan of care, spoke with patient)

## 2019-12-27 NOTE — Progress Notes (Signed)
Completed the unit of blood that was initiated on the pt unit; tolerated the blood transfusion well no s/s of a reaction noted

## 2019-12-27 NOTE — Plan of Care (Signed)
  Problem: Education: Goal: Knowledge of General Education information will improve Description: Including pain rating scale, medication(s)/side effects and non-pharmacologic comfort measures Outcome: Progressing   Problem: Health Behavior/Discharge Planning: Goal: Ability to manage health-related needs will improve Outcome: Progressing   Problem: Clinical Measurements: Goal: Ability to maintain clinical measurements within normal limits will improve Outcome: Progressing Goal: Will remain free from infection Outcome: Progressing Goal: Diagnostic test results will improve Outcome: Progressing Goal: Respiratory complications will improve Outcome: Progressing Goal: Cardiovascular complication will be avoided Outcome: Progressing   Problem: Activity: Goal: Risk for activity intolerance will decrease Outcome: Progressing   Problem: Nutrition: Goal: Adequate nutrition will be maintained Outcome: Progressing   Problem: Coping: Goal: Level of anxiety will decrease Outcome: Progressing   Problem: Education: Goal: Knowledge of risk factors and measures for prevention of condition will improve Outcome: Progressing   Problem: Coping: Goal: Psychosocial and spiritual needs will be supported Outcome: Progressing   Problem: Respiratory: Goal: Will maintain a patent airway Outcome: Progressing Goal: Complications related to the disease process, condition or treatment will be avoided or minimized Outcome: Progressing   Problem: Education: Goal: Knowledge of disease and its progression will improve Outcome: Progressing Goal: Individualized Educational Video(s) Outcome: Progressing   Problem: Fluid Volume: Goal: Compliance with measures to maintain balanced fluid volume will improve Outcome: Progressing   Problem: Health Behavior/Discharge Planning: Goal: Ability to manage health-related needs will improve Outcome: Progressing   Problem: Nutritional: Goal: Ability to make  healthy dietary choices will improve Outcome: Progressing   Problem: Clinical Measurements: Goal: Complications related to the disease process, condition or treatment will be avoided or minimized Outcome: Progressing   Problem: Education: Goal: Ability to demonstrate management of disease process will improve Outcome: Progressing Goal: Ability to verbalize understanding of medication therapies will improve Outcome: Progressing Goal: Individualized Educational Video(s) Outcome: Progressing   Problem: Activity: Goal: Capacity to carry out activities will improve Outcome: Progressing   Problem: Cardiac: Goal: Ability to achieve and maintain adequate cardiopulmonary perfusion will improve Outcome: Progressing

## 2019-12-27 NOTE — Progress Notes (Signed)
[facility] Progress Note   OP HD: [facility] (MWF here) 4h 400/1.5 83.5kg 2/2 bath P4 AVG thigh Hep 2000 - Mircera 56mcg IV q 2 weeks (last 12/06/19) - Calcitriol 0.29mcg PO q HD  Assessment/ Plan:   1)End-stage renal disease -outpt HD is MTTS schedule. Having nausea/ taste issues, presumably due to uremia.Bec of hospital ESRD census will try MWF with daily evaluation for improvement and if not better then will HD Sat as well given that there are a lot of pts on TTS schedule. I will check on the pt census fri afternoon and sat AM. If there is availability then will HD Sat as well.  ( net UF1/17, 1L net 1/20, 1.5L 1/22) Will be able to get him an extra treatment today for uremia given shortened treatments bec of census.  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 Hb dropped to 7.9. given 1U 1/15 - Will dose aranesp 200 (1/22)  8)Secondary hyperparathyroidism -Will continue calcitriol inpatient and will be administered at his dialysis unit upon discharge.renal diet. On velphoro-> upsetting stomach (stopped) and  incr  renvela to 3 tidm on 1/22.  Will recheck on Mon.  9)H/o CVA  10)HIV - on meds  11)Atrial fibrillation - appears chronic  Subjective:   Feels that his breathing is much better.No n/v/dyspnea/f  overnight. He's still on only RA.   Objective:   BP 119/60   Pulse 69   Temp 97.8 F (36.6 C)   Resp 15   Ht 6\' 2"  (1.88 m)   Wt 82.7 kg   SpO2 98%   BMI 23.41 kg/m   Intake/Output Summary (Last 24 hours) at 12/27/2019 5784 Last data filed at 12/26/2019 2140 Gross per 24 hour  Intake 831 ml  Output 1500 ml  Net -669 ml   Weight change: -1.1 kg  Physical Exam: GEN: NAD, A&Ox3, NCAT HEENT: No conjunctival pallor, EOMI NECK: Supple, no thyromegaly LUNGS: CTA B/L no rales, rhonchi or wheezing ON:GEXBM irreg ABD: SNDNT +BS  EXT: No lower extremity edema ACCESS: rt thigh AVG +pulse, RIJ TC  Imaging: No results found.  Labs: BMET Recent Labs  Lab 12/20/19 0741 12/20/19 0741 12/21/19 0500 12/22/19 1152 12/23/19 0414 12/24/19 0706 12/25/19 0613 12/26/19 0846 12/27/19 0439  NA 138   < > 137 135 137 132* 134* 133* 133*  K 4.8   < > 5.2* 3.4* 4.6 4.7 5.0 5.6* 4.9  CL 98   < > 96* 92* 95* 90* 92* 90* 93*  CO2 24   < > 22 26 25 23 24 23 26   GLUCOSE 146*   < > 145* 119* 199* 155* 139* 108* 131*  BUN 69*   < > 102* 48* 68* 107* 107* 140* 84*  CREATININE 7.98*   < > 9.83* 4.59* 7.20* 10.03* 9.46* 11.49* 8.03*  CALCIUM 8.4*   < > 8.8* 8.5* 8.5* 8.9 8.8* 8.8* 8.7*  PHOS 9.7*  --  10.0* 3.9 6.7* 8.0*  --   --   --    < > = values in this interval not displayed.   CBC Recent Labs  Lab 12/21/19 0500 12/21/19 0500 12/22/19 1152 12/22/19 1152 12/23/19 0414 12/23/19 0414 12/24/19 0706 12/25/19 0613 12/26/19 0846 12/27/19 0439  WBC 7.3   < > 12.3*   < > 9.3   < > 13.6* 16.1* 17.0* 11.0*  NEUTROABS 6.5  --  10.7*  --  8.6*  --  12.4*  --   --   --   HGB 7.9*   < > 8.7*   < > 7.5*   < > 7.1* 7.0* 7.4* 7.1*  HCT 23.9*   < > 25.6*   < > 22.8*   < > 21.6* 20.6* 21.8* 21.7*  MCV 105.3*   < > 102.4*   < > 105.6*   < > 104.3* 105.1* 105.3* 107.4*  PLT 345   < > 471*   < > 328   < > 281 282 298 231   < > = values in this interval not displayed.    Medications:    .  allopurinol  100 mg Oral Once per day on Mon Wed Fri  . amiodarone  200 mg Oral Daily  . vitamin C  500 mg Oral Daily  . bictegravir-emtricitabine-tenofovir AF  1 tablet Oral Daily  . brimonidine  1 drop Left Eye BID  . calcitRIOL  0.5 mcg Oral Q M,W,F-HD  . Chlorhexidine Gluconate Cloth  6 each Topical Q0600  . darbepoetin (ARANESP) injection - DIALYSIS  200 mcg Intravenous Q Fri-HD  . dorzolamide-timolol  1 drop Left Eye BID  . famotidine  10 mg Oral Daily  . feeding supplement (ENSURE ENLIVE)  237 mL Oral TID BM  . feeding supplement (PRO-STAT SUGAR FREE 64)  30 mL Oral BID  . insulin aspart  0-9 Units Subcutaneous TID WC  . latanoprost  1 drop Left Eye QHS  . midodrine  10 mg Oral Q M,W,F-HD  . montelukast  10 mg Oral QHS  . multivitamin  1 tablet Oral QHS  . sevelamer carbonate  2,400 mg Oral TID WC  . Warfarin - Pharmacist Dosing Inpatient   Does not apply q1800  . zinc sulfate  220 mg Oral Daily      Otelia Santee, MD 12/27/2019, 7:28 AM

## 2019-12-27 NOTE — Progress Notes (Signed)
PROGRESS NOTE    [facility]  RKY:[id number] DOB: [birth year] DOA: 12/11/2019 PCP: [name], NP   Brief Narrative:  The patient is a 79 year old male with multiple comorbidities including end-stage combined systolic and diastolic heart failure, EF of 20%, ESRD on hemodialysis,, iron deficiency anemia, atrial fibrillation on Coumadin and amiodarone, PEA arrest March 2019, ESRD on HD, GERD, HIV, prostate cancer, CVA, hypertension as well as other comorbidities who presented to the ED on 1/7 with chief complaint generalized weakness, tremors from dialysis (did not complete treatment) and found to be Covid +1/7.   -Initially not started on any Covid treatment as he was not hypoxic however had increasing respiratory rate and CRP 22.9 on 1/8 and so was started on dexamethasone and remdesivir.  -Cardiology consulted for elevated troponin but not deemed a candidate for cardiac intervention, likely demand ischemia and no therapies recommended.   -Nephrology was consulted due to ESRD and palliative care team consulted and patient is now a DNR -Hospital course complicated by ongoing failure to thrive, poor p.o. intake, palliative conversations ongoing, finally improving PO intake and now plan for SNF with Palliative care FU  Assessment & Plan:   COVID-19 virus infection -On admission: Febrile, lactic acidosis. increased respiratory rate to >30 -Completed 4 days dexamethasone, stopped 1/11 due to uremia (BUN 99) and tolerating room air but this was resumed 1/14 as inflammatory markers started trending upwards, since discontinued -5 day Remdesivir course Completed -improved and stable from this standpoint, Dc daily labs -Weaned off O2 -discharge planning, needs SNF, CSW following  Nausea and vomiting -Improved with supportive care, could be secondary to above or uremia  Elevated Troponin with extensive cardiac history End-stage heart failure, systolic and diastolic CHF History of  PEA arrest Cardiomyopathy, EF of 20% -Troponin255->271 on admission -Asymptomatic with baseline LBBB on EKG -Cardiology consulted-no indication for cardiac intervention and recommended end-stage heart failure and recommending optimizing volume status with hemodialysis -Palliative care consulted, as above goals of care conversation had and he is now DNR -Poor prognosis  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 Eden Lathe on 11/10/2019, unfortunately this thrombosed and underwent thrombectomy by Dr. Carlis Abbott on 12/14. Eventually underwent a right SFA to common femoral vein AV loop graft by Dr. 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, macrocytic anemia -continue Epo -Slight downward trend of hemoglobin noted, still close to his baseline range of 7-8, no overt bleeding -Anemia panel with iron deficiency and chronic disease -Given IV iron 1/22 -Hemoglobin now in the 7.0, 7.1 range, will transfuse 1 unit of PRBC  Chronic Hypotension -Continue Midodrine  PAF on Coumadin and amiodarone -continue coumadin and  Amiodarone  Chronic pain  -C/w Tramadol   History of HIV  -[name]  PreDiabetes -continue SSI  Ambulatory Dysfunction -was minimally ambulatory Uses cane and walker at baseline -PT/OT recommending SNF -Discharge planning  DVT prophylaxis: Anticoagulated with Coumadin Code Status: DO NOT RESUSCITATE  Family Communication: No family present at bedside, called and updated [name] 1/22 Disposition Plan: SNF when bed available  Consultants:   Cardiology  Nephrology   Palliative Care   Procedures: VENOUS DUPLEX Upper and Lower  Upper showed No DVT and Lower showed No DVT   Antimicrobials: Anti-infectives (From admission, onward)   Start     Dose/Rate Route Frequency Ordered Stop   12/19/19 1500  vancomycin (VANCOREADY)  IVPB 1500  mg/300 mL     1,500 mg 150 mL/hr over 120 Minutes Intravenous  Once 12/19/19 1446 12/19/19 1750   12/18/19 2000  bictegravir-emtricitabine-tenofovir AF ([name]) 50-200-25 MG per tablet 1 tablet     1 tablet Oral Daily 12/17/19 1514     12/13/19 1000  remdesivir 100 mg in sodium chloride 0.9 % 100 mL IVPB     100 mg 200 mL/hr over 30 Minutes Intravenous Every 24 hours 12/12/19 0724 12/16/19 1152   12/12/19 1000  bictegravir-emtricitabine-tenofovir AF ([name]) 50-200-25 MG per tablet 1 tablet  Status:  Discontinued     1 tablet Oral Daily 12/11/19 2024 12/17/19 1514   12/12/19 0800  remdesivir 200 mg in sodium chloride 0.9% 250 mL IVPB     200 mg 580 mL/hr over 30 Minutes Intravenous Once 12/12/19 0724 12/12/19 1253     Subjective: -No events overnight, eating a little better, about to leave for dialysis  Objective: Vitals:   12/27/19 0400 12/27/19 0557 12/27/19 0558 12/27/19 1143  BP: 125/65 119/60 119/60 124/67  Pulse: 69 69  69  Resp: 17 15  17   Temp:   97.8 F (36.6 C) (!) 97.5 F (36.4 C)  TempSrc:    Oral  SpO2: 99% 98%  98%  Weight:   82.7 kg   Height:        Intake/Output Summary (Last 24 hours) at 12/27/2019 1220 Last data filed at 12/26/2019 2140 Gross per 24 hour  Intake 831 ml  Output 1500 ml  Net -669 ml   Filed Weights   12/26/19 0050 12/26/19 1250 12/27/19 0558  Weight: 82.6 kg 81.5 kg 82.7 kg   Examination: Gen: Elderly chronically ill frail gentleman sitting up in bed, AAO x2, no distress HEENT:  no JVD Lungs: Diminished breath sounds CVS: RRR,No Gallops,Rubs or new Murmurs Abd: soft, Non tender, non distended, BS present Extremities: Trace edema Skin: no new rashes Flat affect  Data Reviewed: I have personally reviewed following labs and imaging studies  CBC: Recent Labs  Lab 12/21/19 0500 12/21/19 0500 12/22/19 1152 12/22/19 1152 12/23/19 0414 12/24/19 0706 12/25/19 0613 12/26/19 0846 12/27/19 0439  WBC 7.3   < > 12.3*    < > 9.3 13.6* 16.1* 17.0* 11.0*  NEUTROABS 6.5  --  10.7*  --  8.6* 12.4*  --   --   --   HGB 7.9*   < > 8.7*   < > 7.5* 7.1* 7.0* 7.4* 7.1*  HCT 23.9*   < > 25.6*   < > 22.8* 21.6* 20.6* 21.8* 21.7*  MCV 105.3*   < > 102.4*   < > 105.6* 104.3* 105.1* 105.3* 107.4*  PLT 345   < > 471*   < > 328 281 282 298 231   < > = values in this interval not displayed.   Basic Metabolic Panel: Recent Labs  Lab 12/21/19 0500 12/21/19 0500 12/22/19 1152 12/22/19 1152 12/23/19 0414 12/24/19 0706 12/25/19 0613 12/26/19 0846 12/27/19 0439  NA 137   < > 135   < > 137 132* 134* 133* 133*  K 5.2*   < > 3.4*   < > 4.6 4.7 5.0 5.6* 4.9  CL 96*   < > 92*   < > 95* 90* 92* 90* 93*  CO2 22   < > 26   < > 25 23 24 23 26   GLUCOSE 145*   < > 119*   < > 199* 155* 139* 108* 131*  BUN 102*   < >  48*   < > 68* 107* 107* 140* 84*  CREATININE 9.83*   < > 4.59*   < > 7.20* 10.03* 9.46* 11.49* 8.03*  CALCIUM 8.8*   < > 8.5*   < > 8.5* 8.9 8.8* 8.8* 8.7*  MG 2.5*  --  2.2  --  2.3 2.5*  --   --   --   PHOS 10.0*  --  3.9  --  6.7* 8.0*  --   --   --    < > = values in this interval not displayed.   GFR: Estimated Creatinine Clearance: 9 mL/min (A) (by C-G formula based on SCr of 8.03 mg/dL (H)). Liver Function Tests: Recent Labs  Lab 12/21/19 0500 12/22/19 1152 12/23/19 0414 12/24/19 0706  AST 20 20 21 16   ALT 25 27 26 22   ALKPHOS 73 101 87 90  BILITOT 0.8 0.6 0.3 0.9  PROT 5.6* 6.5 5.5* 5.2*  ALBUMIN 2.4* 2.8* 2.4* 2.4*   No results for input(s): LIPASE, AMYLASE in the last 168 hours. No results for input(s): AMMONIA in the last 168 hours. Coagulation Profile: Recent Labs  Lab 12/23/19 0414 12/24/19 0706 12/25/19 0613 12/26/19 0625 12/27/19 0439  INR 1.9* 1.8* 2.1* 2.1* 2.1*   Cardiac Enzymes: No results for input(s): CKTOTAL, CKMB, CKMBINDEX, TROPONINI in the last 168 hours. BNP (last 3 results) No results for input(s): PROBNP in the last 8760 hours. HbA1C: No results for input(s):  HGBA1C in the last 72 hours. CBG: Recent Labs  Lab 12/26/19 1324 12/26/19 1521 12/26/19 2048 12/27/19 0803 12/27/19 1143  GLUCAP 118* 133* 163* 89 100*   Lipid Profile: No results for input(s): CHOL, HDL, LDLCALC, TRIG, CHOLHDL, LDLDIRECT in the last 72 hours. Thyroid Function Tests: No results for input(s): TSH, T4TOTAL, FREET4, T3FREE, THYROIDAB in the last 72 hours. Anemia Panel: Recent Labs    12/25/19 0613 12/25/19 1530 12/25/19 1535 12/26/19 0625 12/27/19 0439  VITAMINB12  --  1,277*  --   --   --   FOLATE  --  21.9  --   --   --   FERRITIN   < >  --   --  955* 874*  TIBC  --  234*  --   --   --   IRON  --  41*  --   --   --   RETICCTPCT  --   --  4.3*  --   --    < > = values in this interval not displayed.   Sepsis Labs: No results for input(s): PROCALCITON, LATICACIDVEN in the last 168 hours.  No results found for this or any previous visit (from the past 240 hour(s)).   RN Pressure Injury Documentation:     Estimated body mass index is 23.41 kg/m as calculated from the following:   Height as of this encounter: 6\' 2"  (1.88 m).   Weight as of this encounter: 82.7 kg.  Malnutrition Type:  Nutrition Problem: Inadequate oral intake Etiology: poor appetite, vomiting   Malnutrition Characteristics:  Signs/Symptoms: meal completion < 25%   Nutrition Interventions:  Interventions: Nepro shake, MVI, Prostat, Liberalize Diet   Radiology Studies: No results found. Scheduled Meds: . sodium chloride   Intravenous Once  . allopurinol  100 mg Oral Once per day on Mon Wed Fri  . amiodarone  200 mg Oral Daily  . vitamin C  500 mg Oral Daily  . bictegravir-emtricitabine-tenofovir AF  1 tablet Oral Daily  . brimonidine  1 drop  Left Eye BID  . calcitRIOL  0.5 mcg Oral Q M,W,F-HD  . Chlorhexidine Gluconate Cloth  6 each Topical Q0600  . darbepoetin (ARANESP) injection - DIALYSIS  200 mcg Intravenous Q Fri-HD  . dorzolamide-timolol  1 drop Left Eye BID  .  famotidine  10 mg Oral Daily  . feeding supplement (ENSURE ENLIVE)  237 mL Oral TID BM  . feeding supplement (PRO-STAT SUGAR FREE 64)  30 mL Oral BID  . insulin aspart  0-9 Units Subcutaneous TID WC  . latanoprost  1 drop Left Eye QHS  . midodrine  10 mg Oral Q M,W,F-HD  . montelukast  10 mg Oral QHS  . multivitamin  1 tablet Oral QHS  . sevelamer carbonate  2,400 mg Oral TID WC  . warfarin  4 mg Oral ONCE-1800  . Warfarin - Pharmacist Dosing Inpatient   Does not apply q1800  . zinc sulfate  220 mg Oral Daily   Continuous Infusions:   LOS: 16 days   Domenic Polite, MD

## 2019-12-28 LAB — BPAM RBC
Blood Product Expiration Date: 202102242359
ISSUE DATE / TIME: 202101231440
Unit Type and Rh: 5100

## 2019-12-28 LAB — CBC
HCT: 29.5 % — ABNORMAL LOW (ref 39.0–52.0)
Hemoglobin: 9.6 g/dL — ABNORMAL LOW (ref 13.0–17.0)
MCH: 33 pg (ref 26.0–34.0)
MCHC: 32.5 g/dL (ref 30.0–36.0)
MCV: 101.4 fL — ABNORMAL HIGH (ref 80.0–100.0)
Platelets: 233 10*3/uL (ref 150–400)
RBC: 2.91 MIL/uL — ABNORMAL LOW (ref 4.22–5.81)
RDW: 19.7 % — ABNORMAL HIGH (ref 11.5–15.5)
WBC: 11.9 10*3/uL — ABNORMAL HIGH (ref 4.0–10.5)
nRBC: 0 % (ref 0.0–0.2)

## 2019-12-28 LAB — GLUCOSE, CAPILLARY
Glucose-Capillary: 91 mg/dL (ref 70–99)
Glucose-Capillary: 108 mg/dL — ABNORMAL HIGH (ref 70–99)
Glucose-Capillary: 105 mg/dL — ABNORMAL HIGH (ref 70–99)

## 2019-12-28 LAB — TYPE AND SCREEN
ABO/RH(D): O POS
Antibody Screen: NEGATIVE
Unit division: 0

## 2019-12-28 LAB — PROTIME-INR
INR: 1.6 — ABNORMAL HIGH (ref 0.8–1.2)
Prothrombin Time: 18.7 seconds — ABNORMAL HIGH (ref 11.4–15.2)

## 2019-12-28 MED ORDER — PSYLLIUM 95 % PO PACK
1.0000 | PACK | Freq: Every day | ORAL | Status: DC
  Administered 2019-12-28 – 2020-01-04 (×6): 1 via ORAL
  Filled 2019-12-28 (×14): qty 1

## 2019-12-28 MED ORDER — WARFARIN SODIUM 5 MG PO TABS
5.0000 mg | ORAL_TABLET | Freq: Once | ORAL | Status: AC
  Administered 2019-12-28: 5 mg via ORAL
  Filled 2019-12-28: qty 1

## 2019-12-28 NOTE — Progress Notes (Signed)
[facility] Progress Note   [facility] HD: [facility] (MWF here) 4h 400/1.5 83.5kg 2/2 bath P4 AVG thigh Hep 2000 - Mircera 74mcg IV q 2 weeks (last 12/06/19) - Calcitriol 0.78mcg PO q HD  Assessment/ Plan:   1)End-stage renal disease -outpt HD is MTTS schedule. Having nausea/ taste issues, presumably due to uremia.Bec of hospital ESRD census will try MWF with daily evaluation for improvement and if not better then will HD Sat as well given that there are a lot of pts on TTS schedule. I will check on the pt census fri afternoon and sat AM. If there is availability then will HD Sat as well.  (net UF1/17, 1L net 1/20, 1.5L 1/22, 1L 1/23) Able to get him an extra treatment Saturday; resume MWF.  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 Hb dropped to 7.9. given 1U 1/15 - Dosed  aranesp 200 (1/22)  8)Secondary hyperparathyroidism -Will continue calcitriol inpatient and will be administered at his dialysis unit upon discharge.renal diet. On velphoro->upsetting stomach (stopped) and incr renvela to 3tidm on 1/22.  Will recheck on Mon + give laxative as well.  9)H/o CVA  10)HIV - on meds  11)Atrial fibrillation - appears chronic  Subjective:   Feels that his breathing is much better.No n/v/dyspnea/f overnight. He's still on  only RA. Constipation.   Objective:   BP 125/61 (BP Location: Right Arm)   Pulse 76   Temp 98.6 F (37 C) (Oral)   Resp 18   Ht 6\' 2"  (1.88 m)   Wt 80.8 kg   SpO2 100%   BMI 22.87 kg/m   Intake/Output Summary (Last 24 hours) at 12/28/2019 0730 Last data filed at 12/27/2019 2100 Gross per 24 hour  Intake 455.33 ml  Output 1000 ml  Net -544.67 ml   Weight change: 0.4 kg  Physical Exam: GEN: NAD, A&Ox3, NCAT HEENT: No conjunctival pallor, EOMI NECK: Supple, no thyromegaly LUNGS: CTA B/L no rales, rhonchi or wheezing JO:INOMV irreg ABD: SNDNT +BS  EXT: No lower extremity edema ACCESS: rt thigh AVG +pulse, RIJ TC  Imaging: No results found.  Labs: BMET Recent Labs  Lab 12/22/19 1152 12/23/19 0414 12/24/19 0706 12/25/19 0613 12/26/19 0846 12/27/19 0439 12/28/19 0230  NA 135 137 132* 134* 133* 133* 137  K 3.4* 4.6 4.7 5.0 5.6* 4.9 4.7  CL 92* 95* 90* 92* 90* 93* 97*  CO2 26 25 23 24 23 26 27   GLUCOSE 119* 199* 155* 139* 108* 131* 89  BUN 48* 68* 107* 107* 140* 84* 44*  CREATININE 4.59* 7.20* 10.03* 9.46* 11.49* 8.03* 5.35*  CALCIUM 8.5* 8.5* 8.9 8.8* 8.8* 8.7* 8.8*  PHOS 3.9 6.7* 8.0*  --   --   --   --    CBC Recent Labs  Lab 12/22/19 1152 12/22/19 1152 12/23/19 0414 12/23/19 0414 12/24/19 0706 12/24/19 0706 12/25/19 0613 12/26/19 0846 12/27/19 0439 12/28/19 0230  WBC  12.3*   < > 9.3   < > 13.6*   < > 16.1* 17.0* 11.0* 11.9*  NEUTROABS 10.7*  --  8.6*  --  12.4*  --   --   --   --   --   HGB 8.7*   < > 7.5*   < > 7.1*   < > 7.0* 7.4* 7.1* 9.6*  HCT 25.6*   < > 22.8*   < > 21.6*   < > 20.6* 21.8* 21.7* 29.5*  MCV 102.4*   < > 105.6*   < > 104.3*   < > 105.1* 105.3* 107.4* 101.4*  PLT 471*   < > 328   < > 281   < > 282 298 231 233   < > = values in this interval not displayed.    Medications:    . allopurinol  100 mg Oral Once per day on Mon Wed Fri  . amiodarone  200 mg Oral Daily  . vitamin C  500 mg Oral Daily  .  bictegravir-emtricitabine-tenofovir AF  1 tablet Oral Daily  . brimonidine  1 drop Left Eye BID  . calcitRIOL  0.5 mcg Oral Q M,W,F-HD  . Chlorhexidine Gluconate Cloth  6 each Topical Q0600  . darbepoetin (ARANESP) injection - DIALYSIS  200 mcg Intravenous Q Fri-HD  . dorzolamide-timolol  1 drop Left Eye BID  . famotidine  10 mg Oral Daily  . feeding supplement (ENSURE ENLIVE)  237 mL Oral TID BM  . feeding supplement (PRO-STAT SUGAR FREE 64)  30 mL Oral BID  . insulin aspart  0-9 Units Subcutaneous TID WC  . latanoprost  1 drop Left Eye QHS  . midodrine  10 mg Oral Q M,W,F-HD  . montelukast  10 mg Oral QHS  . multivitamin  1 tablet Oral QHS  . sevelamer carbonate  2,400 mg Oral TID WC  . Warfarin - Pharmacist Dosing Inpatient   Does not apply q1800  . zinc sulfate  220 mg Oral Daily      Otelia Santee, MD 12/28/2019, 7:30 AM

## 2019-12-28 NOTE — Progress Notes (Signed)
ANTICOAGULATION CONSULT NOTE - Follow Up Consult  Pharmacy Consult for Warfarin Indication: atrial fibrillation  Allergies  Allergen Reactions  . Dextromethorphan-Guaifenesin     UNKNOWN   . Tocotrienols     UNKNOWN   . Losartan Potassium Other (See Comments)    Causes constipation    Patient Measurements: Height: 6\' 2"  (188 cm) Weight: 178 lb 2.1 oz (80.8 kg) IBW/kg (Calculated) : 82.2  Vital Signs: Temp: 98.6 F (37 C) (01/24 0432) Temp Source: Oral (01/24 0432) BP: 125/61 (01/24 0432) Pulse Rate: 76 (01/24 0432)  Labs: Recent Labs    12/26/19 0625 12/26/19 0846 12/26/19 0846 12/27/19 0439 12/28/19 0230  HGB  --  7.4*   < > 7.1* 9.6*  HCT  --  21.8*  --  21.7* 29.5*  PLT  --  298  --  231 233  LABPROT 23.6*  --   --  23.3* 18.7*  INR 2.1*  --   --  2.1* 1.6*  CREATININE  --  11.49*  --  8.03* 5.35*   < > = values in this interval not displayed.   Assessment: 79 yo male on warfarin prior to admission for Afib (CHADS2VASc = 8) admitted on 12/11/19 for COVID and elevated D-Dimer > 5 on admission. Pharmacy consulted to dose warfarin for Afib. Warfarin previously held 1/12-17 due to supratherapetic INRs, likely due to decreased PO intake, which is now improving. Bleeding episode was reported in HD on 1/15. After episode, patient received 2 pRBC.   INR 1.6 is subtherapeutic. Hgb 9.6. Plt 233. No reported bleeding but patient did receive 1 unit of PRBC yesterday due to hemoglobin of 7.1.   PTA warfarin dose 2.5 mg on Mon + Fri and 5 mg all other days  Goal of Therapy:  INR 2-3 Monitor platelets by anticoagulation protocol: Yes   Plan:  Warfarin 5mg  x1 today  Monitor INR, CBC and S/S of bleeding daily   Cristela Felt, PharmD PGY1 Pharmacy Resident [employer]: [phone]  12/28/2019,7:42 AM

## 2019-12-28 NOTE — Progress Notes (Signed)
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   -Initially not started on any Covid treatment as he was not hypoxic however had increasing respiratory rate and CRP 22.9 on 1/8 and so was started on dexamethasone and remdesivir.  -Cardiology consulted for elevated troponin but not deemed a candidate for cardiac intervention, likely demand ischemia and no therapies recommended.   -Nephrology was consulted due to ESRD and palliative care team consulted and patient is now a DNR -Hospital course complicated by ongoing failure to thrive, poor p.o. intake, palliative conversations ongoing, finally improving PO intake and now plan for SNF with Palliative care FU -Remains stable awaiting SNF for rehabilitation  Assessment & Plan:   COVID-19 virus infection -On admission: Febrile, lactic acidosis. increased respiratory rate to >30 -Completed 4 days dexamethasone, stopped 1/11 due to uremia (BUN 99) and tolerating room air but this was resumed 1/14 as inflammatory markers started trending upwards, since discontinued -5 day Remdesivir course Completed -improved and stable from this standpoint, Dc daily labs -Weaned off O2 -discharge planning, needs SNF, CSW following  Nausea and vomiting -Improved with supportive care, could be secondary to above or uremia  Elevated Troponin with extensive cardiac history End-stage heart  failure, systolic and diastolic CHF History of PEA arrest Cardiomyopathy, EF of 20% -Troponin255->271 on admission -Asymptomatic with baseline LBBB on EKG -Cardiology consulted-no indication for cardiac intervention and recommended end-stage heart failure and recommending optimizing volume status with hemodialysis -Palliative care consulted, as above goals of care conversation had and he is now DNR -Poor prognosis  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 Eden Lathe on 11/10/2019, unfortunately this thrombosed and underwent thrombectomy by Dr. Carlis Abbott on 12/14. Eventually underwent a right SFA to common femoral vein AV loop graft by Dr. 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, macrocytic anemia -continue Epo -Slight downward trend of hemoglobin noted, still close to his baseline range of 7-8, no overt bleeding -Anemia panel with iron deficiency and chronic disease -Given IV iron 1/22 -Hemoglobin was persistently in the 7.0, 7.1 range, transfused 1 unit of PRBC 1/23, hemoglobin now up to 9.6  Chronic Hypotension -Continue Midodrine  PAF on Coumadin and amiodarone -continue coumadin and  Amiodarone  Chronic pain  -C/w Tramadol   History of HIV  -Stable, continue [name] -Last CD4 count was 413 in 09/2019 with undetectable viral load  PreDiabetes -continue SSI  Ambulatory Dysfunction -was minimally ambulatory Uses cane and walker at baseline -PT/OT recommending SNF -Discharge planning, awaiting SNF  DVT prophylaxis: Anticoagulated with Coumadin Code Status: DO NOT RESUSCITATE  Family Communication: No family present at bedside, called and updated [name] 1/22 Disposition Plan: SNF when bed available, remains medically stable for discharge  Consultants:   Cardiology  Nephrology   Palliative Care   Procedures: VENOUS  DUPLEX Upper and Lower  Upper showed No DVT and  Lower showed No DVT   Antimicrobials: Anti-infectives (From admission, onward)   Start     Dose/Rate Route Frequency Ordered Stop   12/19/19 1500  vancomycin (VANCOREADY) IVPB 1500 mg/300 mL     1,500 mg 150 mL/hr over 120 Minutes Intravenous  Once 12/19/19 1446 12/19/19 1750   12/18/19 2000  bictegravir-emtricitabine-tenofovir AF ([name]) 50-200-25 MG per tablet 1 tablet     1 tablet Oral Daily 12/17/19 1514     12/13/19 1000  remdesivir 100 mg in sodium chloride 0.9 % 100 mL IVPB     100 mg 200 mL/hr over 30 Minutes Intravenous Every 24 hours 12/12/19 0724 12/16/19 1152   12/12/19 1000  bictegravir-emtricitabine-tenofovir AF ([name]) 50-200-25 MG per tablet 1 tablet  Status:  Discontinued     1 tablet Oral Daily 12/11/19 2024 12/17/19 1514   12/12/19 0800  remdesivir 200 mg in sodium chloride 0.9% 250 mL IVPB     200 mg 580 mL/hr over 30 Minutes Intravenous Once 12/12/19 0724 12/12/19 1253     Subjective: -Remains frail but denies any complaints, no events overnight, received 1 unit of PRBC on dialysis yesterday  Objective: Vitals:   12/27/19 1830 12/27/19 1851 12/27/19 2111 12/28/19 0432  BP: 119/68 135/70 123/67 125/61  Pulse: 78 75 81 76  Resp:  16 18 18   Temp:  98 F (36.7 C) 98.1 F (36.7 C) 98.6 F (37 C)  TempSrc:  Oral Oral Oral  SpO2:  96% 100% 100%  Weight:    80.8 kg  Height:        Intake/Output Summary (Last 24 hours) at 12/28/2019 1333 Last data filed at 12/27/2019 2100 Gross per 24 hour  Intake 455.33 ml  Output 1000 ml  Net -544.67 ml   Filed Weights   12/27/19 0558 12/27/19 1543 12/28/19 0432  Weight: 82.7 kg 81.9 kg 80.8 kg   Examination: Gen: Elderly chronically ill frail gentleman sitting up in bed, AAO x2, no distress HEENT: no JVD Lungs: Decreased breath sounds the bases, otherwise clear CVS: RRR,No Gallops,Rubs or new Murmurs Abd: soft, Non tender, non distended, BS  present Extremities: Trace edema Skin: no new rashes Flat affect  Data Reviewed: I have personally reviewed following labs and imaging studies  CBC: Recent Labs  Lab 12/22/19 1152 12/22/19 1152 12/23/19 0414 12/23/19 0414 12/24/19 0706 12/25/19 0613 12/26/19 0846 12/27/19 0439 12/28/19 0230  WBC 12.3*   < > 9.3   < > 13.6* 16.1* 17.0* 11.0* 11.9*  NEUTROABS 10.7*  --  8.6*  --  12.4*  --   --   --   --   HGB 8.7*   < > 7.5*   < > 7.1* 7.0* 7.4* 7.1* 9.6*  HCT 25.6*   < > 22.8*   < > 21.6* 20.6* 21.8* 21.7* 29.5*  MCV 102.4*   < > 105.6*   < > 104.3* 105.1* 105.3* 107.4* 101.4*  PLT 471*   < > 328   < > 281 282 298 231 233   < > = values in this interval not displayed.   Basic Metabolic Panel: Recent Labs  Lab 12/22/19 1152 12/22/19 1152 12/23/19 0414 12/23/19 0414 12/24/19 0706 12/25/19 0613 12/26/19 0846 12/27/19 0439 12/28/19 0230  NA 135   < > 137   < > 132* 134* 133* 133* 137  K 3.4*   < > 4.6   < > 4.7 5.0 5.6* 4.9 4.7  CL 92*   < > 95*   < >  90* 92* 90* 93* 97*  CO2 26   < > 25   < > 23 24 23 26 27   GLUCOSE 119*   < > 199*   < > 155* 139* 108* 131* 89  BUN 48*   < > 68*   < > 107* 107* 140* 84* 44*  CREATININE 4.59*   < > 7.20*   < > 10.03* 9.46* 11.49* 8.03* 5.35*  CALCIUM 8.5*   < > 8.5*   < > 8.9 8.8* 8.8* 8.7* 8.8*  MG 2.2  --  2.3  --  2.5*  --   --   --   --   PHOS 3.9  --  6.7*  --  8.0*  --   --   --   --    < > = values in this interval not displayed.   GFR: Estimated Creatinine Clearance: 13.2 mL/min (A) (by C-G formula based on SCr of 5.35 mg/dL (H)). Liver Function Tests: Recent Labs  Lab 12/22/19 1152 12/23/19 0414 12/24/19 0706  AST 20 21 16   ALT 27 26 22   ALKPHOS 101 87 90  BILITOT 0.6 0.3 0.9  PROT 6.5 5.5* 5.2*  ALBUMIN 2.8* 2.4* 2.4*   No results for input(s): LIPASE, AMYLASE in the last 168 hours. No results for input(s): AMMONIA in the last 168 hours. Coagulation Profile: Recent Labs  Lab 12/24/19 0706 12/25/19 0613  12/26/19 0625 12/27/19 0439 12/28/19 0230  INR 1.8* 2.1* 2.1* 2.1* 1.6*   Cardiac Enzymes: No results for input(s): CKTOTAL, CKMB, CKMBINDEX, TROPONINI in the last 168 hours. BNP (last 3 results) No results for input(s): PROBNP in the last 8760 hours. HbA1C: No results for input(s): HGBA1C in the last 72 hours. CBG: Recent Labs  Lab 12/26/19 1521 12/26/19 2048 12/27/19 0803 12/27/19 1143 12/27/19 2109  GLUCAP 133* 163* 89 100* 110*   Lipid Profile: No results for input(s): CHOL, HDL, LDLCALC, TRIG, CHOLHDL, LDLDIRECT in the last 72 hours. Thyroid Function Tests: No results for input(s): TSH, T4TOTAL, FREET4, T3FREE, THYROIDAB in the last 72 hours. Anemia Panel: Recent Labs    12/25/19 1530 12/25/19 1535 12/26/19 0625 12/27/19 0439  VITAMINB12 1,277*  --   --   --   FOLATE 21.9  --   --   --   FERRITIN  --   --  955* 874*  TIBC 234*  --   --   --   IRON 41*  --   --   --   RETICCTPCT  --  4.3*  --   --    Sepsis Labs: No results for input(s): PROCALCITON, LATICACIDVEN in the last 168 hours.  No results found for this or any previous visit (from the past 240 hour(s)).   RN Pressure Injury Documentation:     Estimated body mass index is 22.87 kg/m as calculated from the following:   Height as of this encounter: 6\' 2"  (1.88 m).   Weight as of this encounter: 80.8 kg.  Malnutrition Type:  Nutrition Problem: Inadequate oral intake Etiology: poor appetite, vomiting   Malnutrition Characteristics:  Signs/Symptoms: meal completion < 25%   Nutrition Interventions:  Interventions: Nepro shake, MVI, Prostat, Liberalize Diet   Radiology Studies: No results found. Scheduled Meds: . allopurinol  100 mg Oral Once per day on Mon Wed Fri  . amiodarone  200 mg Oral Daily  . vitamin C  500 mg Oral Daily  . bictegravir-emtricitabine-tenofovir AF  1 tablet Oral Daily  . brimonidine  1 drop Left Eye BID  . calcitRIOL  0.5 mcg Oral Q M,W,F-HD  . Chlorhexidine  Gluconate Cloth  6 each Topical Q0600  . darbepoetin (ARANESP) injection - DIALYSIS  200 mcg Intravenous Q Fri-HD  . dorzolamide-timolol  1 drop Left Eye BID  . famotidine  10 mg Oral Daily  . feeding supplement (ENSURE ENLIVE)  237 mL Oral TID BM  . feeding supplement (PRO-STAT SUGAR FREE 64)  30 mL Oral BID  . insulin aspart  0-9 Units Subcutaneous TID WC  . latanoprost  1 drop Left Eye QHS  . midodrine  10 mg Oral Q M,W,F-HD  . montelukast  10 mg Oral QHS  . multivitamin  1 tablet Oral QHS  . psyllium  1 packet Oral Daily  . sevelamer carbonate  2,400 mg Oral TID WC  . warfarin  5 mg Oral ONCE-1800  . Warfarin - Pharmacist Dosing Inpatient   Does not apply q1800  . zinc sulfate  220 mg Oral Daily   Continuous Infusions:   LOS: 17 days   Domenic Polite, MD

## 2019-12-28 NOTE — Plan of Care (Signed)
  Problem: Education: Goal: Knowledge of General Education information will improve Description: Including pain rating scale, medication(s)/side effects and non-pharmacologic comfort measures Outcome: Progressing   Problem: Health Behavior/Discharge Planning: Goal: Ability to manage health-related needs will improve Outcome: Progressing   Problem: Clinical Measurements: Goal: Ability to maintain clinical measurements within normal limits will improve Outcome: Progressing Goal: Will remain free from infection Outcome: Progressing Goal: Diagnostic test results will improve Outcome: Progressing Goal: Respiratory complications will improve Outcome: Progressing Goal: Cardiovascular complication will be avoided Outcome: Progressing   Problem: Activity: Goal: Risk for activity intolerance will decrease Outcome: Progressing   Problem: Nutrition: Goal: Adequate nutrition will be maintained Outcome: Progressing   Problem: Coping: Goal: Level of anxiety will decrease Outcome: Progressing   Problem: Education: Goal: Knowledge of risk factors and measures for prevention of condition will improve Outcome: Progressing   Problem: Coping: Goal: Psychosocial and spiritual needs will be supported Outcome: Progressing   Problem: Respiratory: Goal: Will maintain a patent airway Outcome: Progressing Goal: Complications related to the disease process, condition or treatment will be avoided or minimized Outcome: Progressing   Problem: Education: Goal: Knowledge of disease and its progression will improve Outcome: Progressing Goal: Individualized Educational Video(s) Outcome: Progressing   Problem: Fluid Volume: Goal: Compliance with measures to maintain balanced fluid volume will improve Outcome: Progressing   Problem: Health Behavior/Discharge Planning: Goal: Ability to manage health-related needs will improve Outcome: Progressing   Problem: Nutritional: Goal: Ability to make  healthy dietary choices will improve Outcome: Progressing   Problem: Clinical Measurements: Goal: Complications related to the disease process, condition or treatment will be avoided or minimized Outcome: Progressing   Problem: Education: Goal: Ability to demonstrate management of disease process will improve Outcome: Progressing Goal: Ability to verbalize understanding of medication therapies will improve Outcome: Progressing Goal: Individualized Educational Video(s) Outcome: Progressing   Problem: Activity: Goal: Capacity to carry out activities will improve Outcome: Progressing   Problem: Cardiac: Goal: Ability to achieve and maintain adequate cardiopulmonary perfusion will improve Outcome: Progressing

## 2019-12-29 LAB — GLUCOSE, CAPILLARY
Glucose-Capillary: 83 mg/dL (ref 70–99)
Glucose-Capillary: 91 mg/dL (ref 70–99)
Glucose-Capillary: 141 mg/dL — ABNORMAL HIGH (ref 70–99)
Glucose-Capillary: 110 mg/dL — ABNORMAL HIGH (ref 70–99)

## 2019-12-29 LAB — CBC
HCT: 26 % — ABNORMAL LOW (ref 39.0–52.0)
Hemoglobin: 8.4 g/dL — ABNORMAL LOW (ref 13.0–17.0)
MCH: 33.3 pg (ref 26.0–34.0)
MCHC: 32.3 g/dL (ref 30.0–36.0)
MCV: 103.2 fL — ABNORMAL HIGH (ref 80.0–100.0)
Platelets: 160 10*3/uL (ref 150–400)
RBC: 2.52 MIL/uL — ABNORMAL LOW (ref 4.22–5.81)
RDW: 19.9 % — ABNORMAL HIGH (ref 11.5–15.5)
WBC: 8.2 10*3/uL (ref 4.0–10.5)
nRBC: 0.2 % (ref 0.0–0.2)

## 2019-12-29 LAB — BASIC METABOLIC PANEL
Anion gap: 13 (ref 5–15)
BUN: 44 mg/dL — ABNORMAL HIGH (ref 8–23)
CO2: 27 mmol/L (ref 22–32)
Calcium: 8.8 mg/dL — ABNORMAL LOW (ref 8.9–10.3)
Chloride: 97 mmol/L — ABNORMAL LOW (ref 98–111)
Creatinine, Ser: 5.35 mg/dL — ABNORMAL HIGH (ref 0.61–1.24)
GFR calc Af Amer: 11 mL/min — ABNORMAL LOW (ref >60)
GFR calc non Af Amer: 10 mL/min — ABNORMAL LOW (ref >60)
Glucose, Bld: 89 mg/dL (ref 70–99)
Potassium: 4.7 mmol/L (ref 3.5–5.1)
Sodium: 137 mmol/L (ref 135–145)

## 2019-12-29 LAB — PHOSPHORUS: Phosphorus: 4.2 mg/dL (ref 2.5–4.6)

## 2019-12-29 LAB — PROTIME-INR
INR: 1.6 — ABNORMAL HIGH (ref 0.8–1.2)
Prothrombin Time: 18.7 seconds — ABNORMAL HIGH (ref 11.4–15.2)

## 2019-12-29 MED ORDER — WARFARIN SODIUM 7.5 MG PO TABS
7.5000 mg | ORAL_TABLET | Freq: Once | ORAL | Status: AC
  Administered 2019-12-29: 7.5 mg via ORAL
  Filled 2019-12-29: qty 1

## 2019-12-29 MED ORDER — HEPARIN SODIUM (PORCINE) 1000 UNIT/ML IJ SOLN
INTRAMUSCULAR | Status: AC
  Filled 2019-12-29: qty 4

## 2019-12-29 NOTE — Progress Notes (Signed)
ANTICOAGULATION CONSULT NOTE - Follow Up Consult  Pharmacy Consult for Coumadin Indication: atrial fibrillation  Allergies  Allergen Reactions  . Dextromethorphan-Guaifenesin     UNKNOWN   . Tocotrienols     UNKNOWN   . Losartan Potassium Other (See Comments)    Causes constipation    Patient Measurements: Height: 6\' 2"  (188 cm) Weight: 181 lb 7 oz (82.3 kg) IBW/kg (Calculated) : 82.2  Vital Signs: Temp: 97.8 F (36.6 C) (01/25 0443) Temp Source: Oral (01/24 2121) BP: 133/63 (01/25 0443) Pulse Rate: 71 (01/25 0443)  Labs: Recent Labs    12/27/19 0439 12/27/19 0439 12/28/19 0230 12/29/19 0359  HGB 7.1*   < > 9.6* 8.4*  HCT 21.7*  --  29.5* 26.0*  PLT 231  --  233 160  LABPROT 23.3*  --  18.7* 18.7*  INR 2.1*  --  1.6* 1.6*  CREATININE 8.03*  --  5.35*  --    < > = values in this interval not displayed.    Estimated Creatinine Clearance: 13.4 mL/min (A) (by C-G formula based on SCr of 5.35 mg/dL (H)).  Assessment: Anticoag: Warfarin for hx afib (CHADS2VASc = 8)- d-dimer 5.26 (last 1/23). INR 1.6. Hgb 8.4. Plts down to 160. - Warfarin PTA 2.5mg  MF, 5mg  AOD - on amiodarone also PTA.  1/15: (clot >> bleed risk). rapid response was called on patient for excessive bleeding around his HD site. 2 units PRBC. 1/16: INR 4.3; poor appetite, had bleeding episode in HD on 1/15 and received 2 unit of pRBC  1/23: INR 2.1. Hgb 7.1. Plt 231. 1u PRBC. No bleeding noted.  1/25. INR 1.6. Hgb ok at 8.4 but plts down to 160.   Goal of Therapy:  INR 2-3 Monitor platelets by anticoagulation protocol: Yes   Plan:  Warfarin 7.5mg  today  Daily INR   Jemar Paulsen S. Alford Highland, PharmD, BCPS Clinical Staff Pharmacist [email or web address] Alford Highland, Berdella Bacot Stillinger 12/29/2019,9:01 AM

## 2019-12-29 NOTE — TOC Progression Note (Signed)
Transition of Care [facility]) - Progression Note    Patient Details  Name: David [name] MRN: [id number] Date of Birth: 03-25-1941  Transition of Care [facility]) CM/SW [place], LCSW Phone Number: 12/29/2019, 5:10 PM  Clinical Narrative:    CSW still following for SNF placement. Hopeful to get more offers once patient is off of isolation in a few days.    Expected Discharge Plan: [place] Barriers to Discharge: Continued Medical Work up  Expected Discharge Plan and Services Expected Discharge Plan: [place] Choice: [place] arrangements for the past 2 months: Single Family Home                                       Social Determinants of Health (SDOH) Interventions    Readmission Risk Interventions Readmission Risk Prevention Plan 09/02/2019  Transportation Screening Complete  Medication Review (RN Care Manager) Complete  Palliative Care Screening Not Applicable  Some recent data might be hidden

## 2019-12-29 NOTE — Progress Notes (Signed)
Physical Therapy Treatment Patient Details Name: David [name] MRN: [id number] DOB: [birth year] Today's Date: 12/29/2019    History of Present Illness Patient is a 79 year old male with history of combined systolic and diastolic heart failure (EF 20 to 25% 03/04/2019), iron deficiency anemia, atrial fibrillation on Coumadin only tolerates amiodarone, PEA arrest 02/2018, ESRD on HD MTTS, GERD, HIV, prostate cancer, CVA, hypertension who presented to the ED on 1/7 with complaints of generalized weakness and tremors and not feeling well x5 days.  Found to be Covid +.    PT Comments    Pt requesting to go to bathroom on entry, agreeable to use of Stedy to get to [facility]. Pt has HD earlier today and is fatigued, and is requiring increased assist as a result. Pt requires maxAx2 for bed mobility and modAx2 for 4x sit>stand from Bed, Stedy, BSC, Stedy to recliner. D/c plans remain appropriate at this time. PT will continue to follow acutely. Likely pt will be here through end of week when he will be outside the [place] window.    Follow Up Recommendations  SNF;Supervision/Assistance - 24 hour     Equipment Recommendations  Other (comment)(TBD)       Precautions / Restrictions Precautions Precautions: Fall Restrictions Weight Bearing Restrictions: No    Mobility  Bed Mobility Overal bed mobility: Needs Assistance Bed Mobility: Supine to Sit     Supine to sit: Max assist;+2 for physical assistance;+2 for safety/equipment     General bed mobility comments: assist for LEs and trunk elevation, increased assist as pt with urgency to use BSC  Transfers Overall transfer level: Needs assistance Equipment used: Ambulation equipment used Transfers: Sit to/from Stand Sit to Stand: +2 physical assistance;Mod assist            Ambulation/Gait             General Gait Details: unable      Balance Overall balance assessment: Needs assistance   Sitting balance-Leahy Scale: Poor    Postural control: Right lateral lean Standing balance support: Bilateral upper extremity supported Standing balance-Leahy Scale: Poor                              Cognition Arousal/Alertness: Awake/alert Behavior During Therapy: Flat affect Overall Cognitive Status: No family/caregiver present to determine baseline cognitive functioning Area of Impairment: Following commands;Safety/judgement;Problem solving;Attention                   Current Attention Level: Selective   Following Commands: Follows one step commands with increased time Safety/Judgement: Decreased awareness of safety Awareness: Intellectual Problem Solving: Slow processing;Decreased initiation;Difficulty sequencing;Requires verbal cues;Requires tactile cues           General Comments General comments (skin integrity, edema, etc.): VSS on RA       Pertinent Vitals/Pain Pain Assessment: Faces Faces Pain Scale: Hurts a little bit Pain Location: generalized with movement Pain Descriptors / Indicators: Moaning;Grimacing Pain Intervention(s): Limited activity within patient's tolerance;Monitored during session;Repositioned           PT Goals (current goals can now be found in the care plan section) Acute Rehab PT Goals Patient Stated Goal: to feel better  PT Goal Formulation: With patient Time For Goal Achievement: 12/26/19 Potential to Achieve Goals: Good Progress towards PT goals: Progressing toward goals    Frequency    Min 3X/week      PT Plan Current plan remains appropriate    Co-evaluation  Reason for Co-Treatment: Complexity of the patient's impairments (multi-system involvement);For patient/therapist safety;To address functional/ADL transfers   OT goals addressed during session: ADL's and self-care      AM-PAC PT "6 Clicks" Mobility   Outcome Measure  Help needed turning from your back to your side while in a flat bed without using bedrails?: A Lot Help needed  moving from lying on your back to sitting on the side of a flat bed without using bedrails?: Total Help needed moving to and from a bed to a chair (including a wheelchair)?: Total Help needed standing up from a chair using your arms (e.g., wheelchair or bedside chair)?: Total Help needed to walk in hospital room?: Total Help needed climbing 3-5 steps with a railing? : Total 6 Click Score: 7    End of Session   Activity Tolerance: Patient limited by fatigue;Patient limited by lethargy Patient left: in chair;with call bell/phone within reach;with chair alarm set Nurse Communication: Mobility status PT Visit Diagnosis: Other abnormalities of gait and mobility (R26.89);Muscle weakness (generalized) (M62.81);Other symptoms and signs involving the nervous system (N50.413)     Time: 6438-3779 PT Time Calculation (min) (ACUTE ONLY): 42 min  Charges:  $Therapeutic Activity: 8-22 mins                     [place] Lapaglia B. Migdalia Dk PT, DPT Acute Rehabilitation Services Pager [phone] Office [phone]    [place] 12/29/2019, 4:38 PM

## 2019-12-29 NOTE — Progress Notes (Signed)
[facility] Progress Note   OP HD: [facility] (MWF here) 4h 400/1.5 83.5kg 2/2 bath P4 AVG thigh Hep 2000 - Mircera 15mcg IV q 2 weeks (last 12/06/19) - Calcitriol 0.86mcg PO q HD  Assessment/ Plan:   1)End-stage renal disease -outpt HD is MTTS. Had extra HD last week x 4 (10h total) for nausea/ uremia/ solute control and this week is feeling much better w/ resolved nausea and creat 5  - this week will plan 3.5h sessions on MWF and prob 2 hr Sat  2)Nausea/ poor po intake- resolved, eating , no nausea as above  3)COVID-19 infection -Therapies per primary team - s/p  IV dexamethasone and  remdesivir - tested +here on 12/11/19 so quarantine should be up around 1/27- 1/28,  which is this week   4)Combined chronic systolic and diastolic chronic end-stage heart failure(20% EF) - optimize volume with HD as able -Status post cardiology evaluation with little therapy to add given his hypotension  - dry wt 83.5k, lowest wt here 78.5kg, euvolemic on exam today  5)DNR - appreciate pall care assistance  6)Chronic hypotension - onmidodrine  7)Anemia secondary to CKD - On ESA-last dosed on 1/2 as above - had blood loss from HD cathat HDFriday, HD cath checked and therewas no crackso HD completed;gave IV vancx1. Hb dropped to 7.9. given 1U 1/15 - Dosed  aranesp 200 (1/22)  8)Secondary hyperparathyroidism -Will continue calcitriol inpatient and will be administered at his dialysis unit upon discharge.renal diet. On velphoro->upsetting stomach (stopped) and incr renvela to 3tidm on 1/22.  Will recheck on Mon + give laxative as well.  9)H/o CVA  10)HIV - on meds  11)Atrial fibrillation - appears chronic   Kelly Splinter  MD 12/29/2019, 2:07 PM    Subjective:   No c/=o's, nausea gone, hasn't used zofran in > 6 days   Objective:   BP (!) 105/54   Pulse 61   Temp 98 F (36.7 C) (Oral)   Resp 18   Ht 6\' 2"  (1.88 m)   Wt  79.1 kg   SpO2 100%   BMI 22.39 kg/m   Intake/Output Summary (Last 24 hours) at 12/29/2019 1400 Last data filed at 12/29/2019 1023 Gross per 24 hour  Intake 600 ml  Output --  Net 600 ml   Weight change: 0.4 kg   Physical Exam: GEN: NAD, A&Ox3, NCAT HEENT: No conjunctival pallor, EOMI NECK: Supple, no thyromegaly LUNGS: CTA B/L no rales, rhonchi or wheezing WU:JWJXB irreg ABD: SNDNT +BS  EXT: No lower extremity edema ACCESS: rt thigh AVG +pulse, RIJ TC    Medications:    . heparin      . allopurinol  100 mg Oral Once per day on Mon Wed Fri  . amiodarone  200 mg Oral Daily  . vitamin C  500 mg Oral Daily  . bictegravir-emtricitabine-tenofovir AF  1 tablet Oral Daily  . brimonidine  1 drop Left Eye BID  . calcitRIOL  0.5 mcg Oral Q M,W,F-HD  . Chlorhexidine Gluconate Cloth  6 each Topical Q0600  . darbepoetin (ARANESP) injection - DIALYSIS  200 mcg Intravenous Q Fri-HD  . dorzolamide-timolol  1 drop Left Eye BID  . famotidine  10 mg Oral Daily  . feeding supplement (ENSURE ENLIVE)  237 mL Oral TID BM  . feeding supplement (PRO-STAT SUGAR FREE 64)  30 mL Oral BID  . insulin aspart  0-9 Units Subcutaneous TID WC  . latanoprost  1 drop Left Eye QHS  .  midodrine  10 mg Oral Q M,W,F-HD  . montelukast  10 mg Oral QHS  . multivitamin  1 tablet Oral QHS  . psyllium  1 packet Oral Daily  . sevelamer carbonate  2,400 mg Oral TID WC  . warfarin  7.5 mg Oral ONCE-1800  . Warfarin - Pharmacist Dosing Inpatient   Does not apply q1800  . zinc sulfate  220 mg Oral Daily

## 2019-12-29 NOTE — Progress Notes (Signed)
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   -Initially not started on any Covid treatment as he was not hypoxic however had increasing respiratory rate and CRP 22.9 on 1/8 and so was started on dexamethasone and remdesivir.  -Cardiology consulted for elevated troponin but not deemed a candidate for cardiac intervention, likely demand ischemia and no therapies recommended.   -Nephrology was consulted due to ESRD and palliative care team consulted and patient is now a DNR -Hospital course complicated by ongoing failure to thrive, poor p.o. intake, palliative conversations ongoing, finally improving PO intake and now plan for SNF with Palliative care FU -Remains stable awaiting SNF for rehabilitation  Assessment & Plan:   COVID-19 virus infection -On admission: Febrile, lactic acidosis. increased respiratory rate to >30 -Completed 4 days dexamethasone, stopped 1/11 due to uremia (BUN 99) and tolerating room air but this was resumed 1/14 as inflammatory markers started trending upwards, since discontinued -5 day Remdesivir course Completed -improved and stable from this standpoint, Dc daily labs -Weaned off O2 -Remains debilitated and frail, PT eval completed, SNF recommended for rehabilitation -Awaiting SNF, social work following  Nausea and vomiting -Likely secondary to Covid infection and uremia -This has  resolved  Elevated Troponin with extensive cardiac history End-stage heart failure, systolic and diastolic CHF History of PEA arrest Cardiomyopathy, EF of 20% -Troponin255->271 on admission -Asymptomatic with baseline LBBB on EKG -Cardiology consulted-no indication for cardiac intervention and recommended end-stage heart failure and recommending optimizing volume status with hemodialysis -Palliative care consulted, as above goals of care conversation had and he is now DNR -Poor prognosis  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 Eden Lathe on 11/10/2019, unfortunately this thrombosed and underwent thrombectomy by Dr. Carlis Abbott on 12/14. Eventually underwent a right SFA to common femoral vein AV loop graft by Dr. 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, macrocytic anemia -continue Epo -Slight downward trend of hemoglobin noted, still close to his baseline range of 7-8, no overt bleeding -Anemia panel with iron deficiency and chronic disease -Given IV iron 1/22 -Hemoglobin was persistently in the 7.0, 7.1 range, transfused 1 unit of PRBC 1/23, hemoglobin now 8.4, monitor  Chronic Hypotension -Continue Midodrine  PAF on Coumadin and amiodarone -continue coumadin and  Amiodarone  Chronic pain  -C/w Tramadol   History of HIV  -Stable, continue [name] -Last CD4 count was 413 in 09/2019 with undetectable viral load  PreDiabetes -continue SSI  Ambulatory Dysfunction -was minimally ambulatory Uses cane and walker at baseline -PT/OT recommending SNF -Discharge planning, awaiting SNF  DVT prophylaxis: Anticoagulated with Coumadin Code Status: DO NOT RESUSCITATE  Family Communication: No family present at bedside, called and updated [name] 1/22, 1/23 Disposition Plan: SNF when bed available, remains medically stable for discharge   Consultants:   Cardiology  Nephrology   Palliative Care   Procedures: VENOUS DUPLEX  Upper and Lower  Upper showed No DVT and Lower showed No DVT   Antimicrobials: Anti-infectives (From admission, onward)   Start     Dose/Rate Route Frequency Ordered Stop   12/19/19 1500  vancomycin (VANCOREADY) IVPB 1500 mg/300 mL     1,500 mg 150 mL/hr over 120 Minutes Intravenous  Once 12/19/19 1446 12/19/19 1750   12/18/19 2000  bictegravir-emtricitabine-tenofovir AF ([name]) 50-200-25 MG per tablet 1 tablet     1 tablet Oral Daily 12/17/19 1514     12/13/19 1000  remdesivir 100 mg in sodium chloride 0.9 % 100 mL IVPB     100 mg 200 mL/hr over 30 Minutes Intravenous Every 24 hours 12/12/19 0724 12/16/19 1152   12/12/19 1000  bictegravir-emtricitabine-tenofovir AF ([name]) 50-200-25 MG per tablet 1 tablet  Status:  Discontinued     1 tablet Oral Daily 12/11/19 2024 12/17/19 1514   12/12/19 0800  remdesivir 200 mg in sodium chloride 0.9% 250 mL IVPB     200 mg 580 mL/hr over 30 Minutes Intravenous Once 12/12/19 0724 12/12/19 1253     Subjective: -No events overnight, denies any complaints  Objective: Vitals:   12/29/19 1230 12/29/19 1300 12/29/19 1330 12/29/19 1400  BP: (!) 103/57 (!) 115/56 (!) 105/54 (!) 109/59  Pulse: 74 71 61 78  Resp:      Temp:      TempSrc:      SpO2:      Weight:      Height:        Intake/Output Summary (Last 24 hours) at 12/29/2019 1416 Last data filed at 12/29/2019 1023 Gross per 24 hour  Intake 600 ml  Output -  Net 600 ml   Filed Weights   12/28/19 0432 12/29/19 0443 12/29/19 1111  Weight: 80.8 kg 82.3 kg 79.1 kg   Examination: Gen: elderly frail chronically ill male, AAOx2, no distress HEENT:  no JVD Lungs: decreased BS at bases, clear CVS: RRR,No Gallops,Rubs or new Murmurs Abd: soft, Non tender, non distended, BS present Extremities: No edema Skin: no new rashes Flat affect  Data Reviewed: I have personally reviewed following  labs and imaging studies  CBC: Recent Labs  Lab 12/23/19 0414 12/23/19 0414 12/24/19 0706 12/24/19 0706 12/25/19 0613 12/26/19 0846 12/27/19 0439 12/28/19 0230 12/29/19 0359  WBC 9.3   < > 13.6*   < > 16.1* 17.0* 11.0* 11.9* 8.2  NEUTROABS 8.6*  --  12.4*  --   --   --   --   --   --   HGB 7.5*   < > 7.1*   < > 7.0* 7.4* 7.1* 9.6* 8.4*  HCT 22.8*   < > 21.6*   < > 20.6* 21.8* 21.7* 29.5* 26.0*  MCV 105.6*   < > 104.3*   < > 105.1* 105.3* 107.4* 101.4* 103.2*  PLT 328   < > 281   < > 282 298 231 233 160   < > = values in this interval not displayed.   Basic Metabolic Panel: Recent Labs  Lab 12/23/19 0414 12/23/19 0414 12/24/19 0706 12/25/19 0613 12/26/19 0846 12/27/19 0439 12/28/19 0230 12/29/19 0359  NA 137   < > 132* 134* 133* 133* 137  --   K 4.6   < > 4.7 5.0 5.6* 4.9 4.7  --   CL 95*   < > 90* 92* 90* 93* 97*  --   CO2 25   < > 23 24 23 26 27   --  GLUCOSE 199*   < > 155* 139* 108* 131* 89  --   BUN 68*   < > 107* 107* 140* 84* 44*  --   CREATININE 7.20*   < > 10.03* 9.46* 11.49* 8.03* 5.35*  --   CALCIUM 8.5*   < > 8.9 8.8* 8.8* 8.7* 8.8*  --   MG 2.3  --  2.5*  --   --   --   --   --   PHOS 6.7*  --  8.0*  --   --   --   --  4.2   < > = values in this interval not displayed.   GFR: Estimated Creatinine Clearance: 12.9 mL/min (A) (by C-G formula based on SCr of 5.35 mg/dL (H)). Liver Function Tests: Recent Labs  Lab 12/23/19 0414 12/24/19 0706  AST 21 16  ALT 26 22  ALKPHOS 87 90  BILITOT 0.3 0.9  PROT 5.5* 5.2*  ALBUMIN 2.4* 2.4*   No results for input(s): LIPASE, AMYLASE in the last 168 hours. No results for input(s): AMMONIA in the last 168 hours. Coagulation Profile: Recent Labs  Lab 12/25/19 0613 12/26/19 0625 12/27/19 0439 12/28/19 0230 12/29/19 0359  INR 2.1* 2.1* 2.1* 1.6* 1.6*   Cardiac Enzymes: No results for input(s): CKTOTAL, CKMB, CKMBINDEX, TROPONINI in the last 168 hours. BNP (last 3 results) No results for input(s): PROBNP  in the last 8760 hours. HbA1C: No results for input(s): HGBA1C in the last 72 hours. CBG: Recent Labs  Lab 12/27/19 2109 12/28/19 1341 12/28/19 1646 12/28/19 2119 12/29/19 0754  GLUCAP 110* 91 108* 105* 83   Lipid Profile: No results for input(s): CHOL, HDL, LDLCALC, TRIG, CHOLHDL, LDLDIRECT in the last 72 hours. Thyroid Function Tests: No results for input(s): TSH, T4TOTAL, FREET4, T3FREE, THYROIDAB in the last 72 hours. Anemia Panel: Recent Labs    12/27/19 0439  FERRITIN 874*   Sepsis Labs: No results for input(s): PROCALCITON, LATICACIDVEN in the last 168 hours.  No results found for this or any previous visit (from the past 240 hour(s)).   RN Pressure Injury Documentation:     Estimated body mass index is 22.39 kg/m as calculated from the following:   Height as of this encounter: 6\' 2"  (1.88 m).   Weight as of this encounter: 79.1 kg.  Malnutrition Type:  Nutrition Problem: Inadequate oral intake Etiology: poor appetite, vomiting   Malnutrition Characteristics:  Signs/Symptoms: meal completion < 25%   Nutrition Interventions:  Interventions: Nepro shake, MVI, Prostat, Liberalize Diet   Radiology Studies: No results found. Scheduled Meds: . heparin      . allopurinol  100 mg Oral Once per day on Mon Wed Fri  . amiodarone  200 mg Oral Daily  . vitamin C  500 mg Oral Daily  . bictegravir-emtricitabine-tenofovir AF  1 tablet Oral Daily  . brimonidine  1 drop Left Eye BID  . calcitRIOL  0.5 mcg Oral Q M,W,F-HD  . Chlorhexidine Gluconate Cloth  6 each Topical Q0600  . darbepoetin (ARANESP) injection - DIALYSIS  200 mcg Intravenous Q Fri-HD  . dorzolamide-timolol  1 drop Left Eye BID  . famotidine  10 mg Oral Daily  . feeding supplement (ENSURE ENLIVE)  237 mL Oral TID BM  . feeding supplement (PRO-STAT SUGAR FREE 64)  30 mL Oral BID  . insulin aspart  0-9 Units Subcutaneous TID WC  . latanoprost  1 drop Left Eye QHS  . midodrine  10 mg Oral Q  M,W,F-HD  .  montelukast  10 mg Oral QHS  . multivitamin  1 tablet Oral QHS  . psyllium  1 packet Oral Daily  . sevelamer carbonate  2,400 mg Oral TID WC  . warfarin  7.5 mg Oral ONCE-1800  . Warfarin - Pharmacist Dosing Inpatient   Does not apply q1800  . zinc sulfate  220 mg Oral Daily   Continuous Infusions:   LOS: 18 days   Domenic Polite, MD

## 2019-12-29 NOTE — Progress Notes (Signed)
Occupational Therapy Treatment Patient Details Name: David [name] MRN: [id number] DOB: [birth year] Today's Date: 12/29/2019    History of present illness Patient is a 79 year old male with history of combined systolic and diastolic heart failure (EF 20 to 25% 03/04/2019), iron deficiency anemia, atrial fibrillation on Coumadin only tolerates amiodarone, PEA arrest 02/2018, ESRD on HD MTTS, GERD, HIV, prostate cancer, CVA, hypertension who presented to the ED on 1/7 with complaints of generalized weakness and tremors and not feeling well x5 days.  Found to be Covid +.   OT comments  Pt progressing towards OT goals, tolerating OOB to [facility] and then to recliner. Use of Stedy for functional transfers this session with pt requiring mod-max+2 for sit<>Stand at [facility]. VSS throughout. Pt continues to fatigue with activity but overall tolerating OOB well. Will continue per POC.   Follow Up Recommendations  SNF;Supervision/Assistance - 24 hour    Equipment Recommendations  None recommended by OT          Precautions / Restrictions Precautions Precautions: Fall Restrictions Weight Bearing Restrictions: No       Mobility Bed Mobility Overal bed mobility: Needs Assistance Bed Mobility: Supine to Sit     Supine to sit: Max assist;+2 for physical assistance;+2 for safety/equipment     General bed mobility comments: assist for LEs and trunk elevation, increased assist as pt with urgency to use BSC  Transfers Overall transfer level: Needs assistance Equipment used: Ambulation equipment used Transfers: Sit to/from Stand Sit to Stand: +2 physical assistance;Mod assist         General transfer comment: use of Stedy for transfer to [name] and then recliner    Balance Overall balance assessment: Needs assistance   Sitting balance-Leahy Scale: Poor   Postural control: Right lateral lean Standing balance support: Bilateral upper extremity supported Standing balance-Leahy Scale: Poor                             ADL either performed or assessed with clinical judgement   ADL Overall ADL's : Needs assistance/impaired                         Toilet Transfer: +2 for physical assistance;+2 for safety/equipment;Maximal assistance;Moderate assistance Toilet Transfer Details (indicate cue type and reason): via stedy to [place] and Hygiene: Total assistance;+2 for physical assistance;+2 for safety/equipment;Sit to/from stand       Functional mobility during ADLs: Moderate assistance;Maximal assistance(sit<>Stand at [facility])       Vision       Perception     Praxis      Cognition Arousal/Alertness: Awake/alert Behavior During Therapy: Flat affect Overall Cognitive Status: No family/caregiver present to determine baseline cognitive functioning Area of Impairment: Following commands;Safety/judgement;Problem solving;Attention                   Current Attention Level: Selective   Following Commands: Follows one step commands with increased time Safety/Judgement: Decreased awareness of safety Awareness: Intellectual Problem Solving: Slow processing;Decreased initiation;Difficulty sequencing;Requires verbal cues;Requires tactile cues          Exercises     Shoulder Instructions       General Comments VSS on RA     Pertinent Vitals/ Pain       Pain Assessment: Faces Faces Pain Scale: Hurts a little bit Pain Location: generalized with movement Pain Descriptors / Indicators: Moaning;Grimacing Pain Intervention(s): Limited activity within patient's tolerance;Monitored  during session;Repositioned  Home Living                                          Prior Functioning/Environment              Frequency  Min 2X/week        Progress Toward Goals  OT Goals(current goals can now be found in the care plan section)  Progress towards OT goals: Progressing toward goals  Acute  Rehab OT Goals Patient Stated Goal: to feel better  OT Goal Formulation: With patient Time For Goal Achievement: 01/12/20 Potential to Achieve Goals: Good  Plan Discharge plan remains appropriate    Co-evaluation    PT/OT/SLP Co-Evaluation/Treatment: Yes Reason for Co-Treatment: Complexity of the patient's impairments (multi-system involvement);For patient/therapist safety;To address functional/ADL transfers   OT goals addressed during session: ADL's and self-care      AM-PAC OT "6 Clicks" Daily Activity     Outcome Measure   Help from another person eating meals?: A Little Help from another person taking care of personal grooming?: A Little Help from another person toileting, which includes using toliet, bedpan, or urinal?: Total Help from another person bathing (including washing, rinsing, drying)?: A Lot Help from another person to put on and taking off regular upper body clothing?: A Lot Help from another person to put on and taking off regular lower body clothing?: Total 6 Click Score: 12    End of Session    OT Visit Diagnosis: Unsteadiness on feet (R26.81);Cognitive communication deficit (R41.841);Muscle weakness (generalized) (M62.81)   Activity Tolerance Patient tolerated treatment well   Patient Left in chair;with call bell/phone within reach;with chair alarm set   Nurse Communication Mobility status;Need for lift equipment        Time: 0141-0301 OT Time Calculation (min): 42 min  Charges: OT General Charges $OT Visit: 1 Visit OT Treatments $Self Care/Home Management : 23-37 mins  David [name], OT Supplemental Rehabilitation Services Pager [phone] Office [phone]   David [name] 12/29/2019, 6:10 PM

## 2019-12-30 ENCOUNTER — Other Ambulatory Visit: Payer: Medicare HMO

## 2019-12-30 LAB — PROTIME-INR
INR: 2 — ABNORMAL HIGH (ref 0.8–1.2)
Prothrombin Time: 22.4 seconds — ABNORMAL HIGH (ref 11.4–15.2)

## 2019-12-30 LAB — CBC
HCT: 27.5 % — ABNORMAL LOW (ref 39.0–52.0)
Hemoglobin: 9.1 g/dL — ABNORMAL LOW (ref 13.0–17.0)
MCH: 34.5 pg — ABNORMAL HIGH (ref 26.0–34.0)
MCHC: 33.1 g/dL (ref 30.0–36.0)
MCV: 104.2 fL — ABNORMAL HIGH (ref 80.0–100.0)
Platelets: 157 10*3/uL (ref 150–400)
RBC: 2.64 MIL/uL — ABNORMAL LOW (ref 4.22–5.81)
RDW: 19.9 % — ABNORMAL HIGH (ref 11.5–15.5)
WBC: 7.8 10*3/uL (ref 4.0–10.5)
nRBC: 0.3 % — ABNORMAL HIGH (ref 0.0–0.2)

## 2019-12-30 LAB — GLUCOSE, CAPILLARY
Glucose-Capillary: 91 mg/dL (ref 70–99)
Glucose-Capillary: 138 mg/dL — ABNORMAL HIGH (ref 70–99)
Glucose-Capillary: 104 mg/dL — ABNORMAL HIGH (ref 70–99)
Glucose-Capillary: 123 mg/dL — ABNORMAL HIGH (ref 70–99)

## 2019-12-30 MED ORDER — CHLORHEXIDINE GLUCONATE CLOTH 2 % EX PADS
6.0000 | MEDICATED_PAD | Freq: Every day | CUTANEOUS | Status: DC
  Administered 2019-12-30 – 2020-01-01 (×3): 6 via TOPICAL

## 2019-12-30 MED ORDER — WARFARIN SODIUM 5 MG PO TABS
5.0000 mg | ORAL_TABLET | Freq: Once | ORAL | Status: AC
  Administered 2019-12-30: 5 mg via ORAL
  Filled 2019-12-30: qty 1

## 2019-12-30 NOTE — Progress Notes (Signed)
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   -Initially not started on any Covid treatment as he was not hypoxic however had increasing respiratory rate and CRP 22.9 on 1/8 and so was started on dexamethasone and remdesivir.  -Cardiology consulted for elevated troponin but not deemed a candidate for cardiac intervention, likely demand ischemia and no therapies recommended.   -Nephrology was consulted due to ESRD and palliative care team consulted and patient is now a DNR -Hospital course complicated by ongoing failure to thrive, poor p.o. intake, palliative conversations ongoing, finally improving PO intake and now plan for SNF with Palliative care FU -Remains stable awaiting SNF for rehabilitation, will complete Covid isolation on 1/28  Assessment & Plan:   COVID-19 virus infection -Positive on 1/7, on admission had fever, tachypnea and lactic acidosis -Completed 5 days of remdesivir, 4 days of dexamethasone, stopped 1/11 due to uremia (BUN 99) -improved and stable from this standpoint, weaned off O2 -Remains debilitated and frail, PT eval completed, SNF recommended for rehabilitation -Awaiting SNF, social work following -Will complete Covid isolation on 1/28  Nausea and vomiting -Likely secondary to Covid infection and uremia -This has resolved  Elevated Troponin with extensive cardiac history End-stage  heart failure, systolic and diastolic CHF History of PEA arrest Cardiomyopathy, EF of 20% -Troponin255->271 on admission -Asymptomatic with baseline LBBB on EKG -Cardiology consulted-no indication for cardiac intervention and recommended end-stage heart failure and recommending optimizing volume status with hemodialysis -Palliative care consulted, as above goals of care conversation had and he is now DNR -Poor prognosis  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 Eden Lathe on 11/10/2019, unfortunately this thrombosed and underwent thrombectomy by Dr. Carlis Abbott on 12/14. Eventually underwent a right SFA to common femoral vein AV loop graft by Dr. 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, macrocytic anemia -continue Epo -Slight downward trend of hemoglobin noted, still close to his baseline range of 7-8, no overt bleeding -Anemia panel with iron deficiency and chronic disease -Given IV iron 1/22 -Hemoglobin was persistently in the 7.0, 7.1 range, transfused 1 unit of PRBC 1/23, hemoglobin now 8-9 range, monitor  Chronic Hypotension -Continue Midodrine  PAF on Coumadin and amiodarone -continue coumadin and  Amiodarone  Chronic pain  -C/w Tramadol   History of HIV  -Stable, continue [name] -Last CD4 count was 413 in 09/2019 with undetectable viral load  PreDiabetes -continue SSI  Ambulatory Dysfunction -was minimally ambulatory Uses cane and walker at baseline -PT/OT recommending SNF -Discharge planning, awaiting SNF  DVT prophylaxis: Anticoagulated with Coumadin Code Status: DO NOT RESUSCITATE  Family Communication: No family present at bedside, called and updated [name] 1/22, 1/23, 1/25 Disposition Plan: SNF when bed available, remains medically stable for discharge  Consultants:   Cardiology  Nephrology   Palliative  Care   Procedures: VENOUS DUPLEX Upper and Lower  Upper  showed No DVT and Lower showed No DVT   Antimicrobials: Anti-infectives (From admission, onward)   Start     Dose/Rate Route Frequency Ordered Stop   12/19/19 1500  vancomycin (VANCOREADY) IVPB 1500 mg/300 mL     1,500 mg 150 mL/hr over 120 Minutes Intravenous  Once 12/19/19 1446 12/19/19 1750   12/18/19 2000  bictegravir-emtricitabine-tenofovir AF ([name]) 50-200-25 MG per tablet 1 tablet     1 tablet Oral Daily 12/17/19 1514     12/13/19 1000  remdesivir 100 mg in sodium chloride 0.9 % 100 mL IVPB     100 mg 200 mL/hr over 30 Minutes Intravenous Every 24 hours 12/12/19 0724 12/16/19 1152   12/12/19 1000  bictegravir-emtricitabine-tenofovir AF ([name]) 50-200-25 MG per tablet 1 tablet  Status:  Discontinued     1 tablet Oral Daily 12/11/19 2024 12/17/19 1514   12/12/19 0800  remdesivir 200 mg in sodium chloride 0.9% 250 mL IVPB     200 mg 580 mL/hr over 30 Minutes Intravenous Once 12/12/19 0724 12/12/19 1253     Subjective: -No events overnight, appetite is better, finished his whole breakfast, in good spirits  Objective: Vitals:   12/29/19 1506 12/29/19 2117 12/30/19 0505 12/30/19 0511  BP:  (!) 105/58  (!) 110/58  Pulse: 77 78  78  Resp:  19  18  Temp:  97.6 F (36.4 C)  98.9 F (37.2 C)  TempSrc:  Oral  Oral  SpO2:  100%  100%  Weight:   81 kg   Height:        Intake/Output Summary (Last 24 hours) at 12/30/2019 1249 Last data filed at 12/30/2019 1100 Gross per 24 hour  Intake 80 ml  Output 2000 ml  Net -1920 ml   Filed Weights   12/29/19 1111 12/29/19 1425 12/30/19 0505  Weight: 79.1 kg 77 kg 81 kg   Examination:  Gen: Elderly chronically ill-appearing male, AAO x2, no distress HEENT:no JVD, right IJ tunneled catheter Lungs: Poor air movement, otherwise clear  CVS: S1-S2, regular rate rhythm Abd: soft, Non tender, non distended, BS present Extremities: No edema Skin: Chronic skin changes Flat  affect   Data Reviewed: I have personally reviewed following labs and imaging studies  CBC: Recent Labs  Lab 12/24/19 0706 12/25/19 0613 12/26/19 0846 12/27/19 0439 12/28/19 0230 12/29/19 0359 12/30/19 0535  WBC 13.6*   < > 17.0* 11.0* 11.9* 8.2 7.8  NEUTROABS 12.4*  --   --   --   --   --   --   HGB 7.1*   < > 7.4* 7.1* 9.6* 8.4* 9.1*  HCT 21.6*   < > 21.8* 21.7* 29.5* 26.0* 27.5*  MCV 104.3*   < > 105.3* 107.4* 101.4* 103.2* 104.2*  PLT 281   < > 298 231 233 160 157   < > = values in this interval not displayed.   Basic Metabolic Panel: Recent Labs  Lab 12/24/19 0706 12/25/19 0613 12/26/19 0846 12/27/19 0439 12/28/19 0230 12/29/19 0359  NA 132* 134* 133* 133* 137  --   K 4.7 5.0 5.6* 4.9 4.7  --   CL 90* 92* 90* 93* 97*  --   CO2 23 24 23 26 27   --   GLUCOSE 155* 139* 108* 131* 89  --   BUN 107* 107* 140* 84* 44*  --   CREATININE 10.03* 9.46* 11.49* 8.03* 5.35*  --   CALCIUM 8.9 8.8* 8.8* 8.7* 8.8*  --   MG 2.5*  --   --   --   --   --  PHOS 8.0*  --   --   --   --  4.2   GFR: Estimated Creatinine Clearance: 13.2 mL/min (A) (by C-G formula based on SCr of 5.35 mg/dL (H)). Liver Function Tests: Recent Labs  Lab 12/24/19 0706  AST 16  ALT 22  ALKPHOS 90  BILITOT 0.9  PROT 5.2*  ALBUMIN 2.4*   No results for input(s): LIPASE, AMYLASE in the last 168 hours. No results for input(s): AMMONIA in the last 168 hours. Coagulation Profile: Recent Labs  Lab 12/26/19 0625 12/27/19 0439 12/28/19 0230 12/29/19 0359 12/30/19 0535  INR 2.1* 2.1* 1.6* 1.6* 2.0*   Cardiac Enzymes: No results for input(s): CKTOTAL, CKMB, CKMBINDEX, TROPONINI in the last 168 hours. BNP (last 3 results) No results for input(s): PROBNP in the last 8760 hours. HbA1C: No results for input(s): HGBA1C in the last 72 hours. CBG: Recent Labs  Lab 12/29/19 1452 12/29/19 1713 12/29/19 2119 12/30/19 0750 12/30/19 1144  GLUCAP 91 141* 110* 91 138*   Lipid Profile: No results for  input(s): CHOL, HDL, LDLCALC, TRIG, CHOLHDL, LDLDIRECT in the last 72 hours. Thyroid Function Tests: No results for input(s): TSH, T4TOTAL, FREET4, T3FREE, THYROIDAB in the last 72 hours. Anemia Panel: No results for input(s): VITAMINB12, FOLATE, FERRITIN, TIBC, IRON, RETICCTPCT in the last 72 hours. Sepsis Labs: No results for input(s): PROCALCITON, LATICACIDVEN in the last 168 hours.  No results found for this or any previous visit (from the past 240 hour(s)).   RN Pressure Injury Documentation:     Estimated body mass index is 22.93 kg/m as calculated from the following:   Height as of this encounter: 6\' 2"  (1.88 m).   Weight as of this encounter: 81 kg.  Malnutrition Type:  Nutrition Problem: Inadequate oral intake Etiology: poor appetite, vomiting   Malnutrition Characteristics:  Signs/Symptoms: meal completion < 25%   Nutrition Interventions:  Interventions: Nepro shake, MVI, Prostat, Liberalize Diet   Radiology Studies: No results found. Scheduled Meds: . allopurinol  100 mg Oral Once per day on Mon Wed Fri  . amiodarone  200 mg Oral Daily  . vitamin C  500 mg Oral Daily  . bictegravir-emtricitabine-tenofovir AF  1 tablet Oral Daily  . brimonidine  1 drop Left Eye BID  . calcitRIOL  0.5 mcg Oral Q M,W,F-HD  . Chlorhexidine Gluconate Cloth  6 each Topical Q0600  . Chlorhexidine Gluconate Cloth  6 each Topical Q0600  . darbepoetin (ARANESP) injection - DIALYSIS  200 mcg Intravenous Q Fri-HD  . dorzolamide-timolol  1 drop Left Eye BID  . famotidine  10 mg Oral Daily  . feeding supplement (ENSURE ENLIVE)  237 mL Oral TID BM  . insulin aspart  0-9 Units Subcutaneous TID WC  . latanoprost  1 drop Left Eye QHS  . midodrine  10 mg Oral Q M,W,F-HD  . montelukast  10 mg Oral QHS  . multivitamin  1 tablet Oral QHS  . psyllium  1 packet Oral Daily  . sevelamer carbonate  2,400 mg Oral TID WC  . warfarin  5 mg Oral ONCE-1800  . Warfarin - Pharmacist Dosing Inpatient    Does not apply q1800  . zinc sulfate  220 mg Oral Daily   Continuous Infusions:   LOS: 19 days   Domenic Polite, MD

## 2019-12-30 NOTE — Progress Notes (Signed)
ANTICOAGULATION CONSULT NOTE - Follow Up Consult  Pharmacy Consult for Coumadin Indication: atrial fibrillation  Allergies  Allergen Reactions  . Dextromethorphan-Guaifenesin     UNKNOWN   . Tocotrienols     UNKNOWN   . Losartan Potassium Other (See Comments)    Causes constipation    Patient Measurements: Height: 6\' 2"  (188 cm) Weight: 178 lb 9.2 oz (81 kg) IBW/kg (Calculated) : 82.2  Vital Signs: Temp: 98.9 F (37.2 C) (01/26 0511) Temp Source: Oral (01/26 0511) BP: 110/58 (01/26 0511) Pulse Rate: 78 (01/26 0511)  Labs: Recent Labs    12/28/19 0230 12/28/19 0230 12/29/19 0359 12/30/19 0535  HGB 9.6*   < > 8.4* 9.1*  HCT 29.5*  --  26.0* 27.5*  PLT 233  --  160 157  LABPROT 18.7*  --  18.7* 22.4*  INR 1.6*  --  1.6* 2.0*  CREATININE 5.35*  --   --   --    < > = values in this interval not displayed.    Estimated Creatinine Clearance: 13.2 mL/min (A) (by C-G formula based on SCr of 5.35 mg/dL (H)).  Assessment: Anticoag: Warfarin for hx afib (CHADS2VASc = 8)- d-dimer 5.26 (last 1/23). INR 1.6>2 today. Hgb 9.1. Plts down to 157. - Warfarin PTA 2.5mg  MF, 5mg  AOD - on amiodarone also PTA.  1/15: (clot >> bleed risk). rapid response was called on patient for excessive bleeding around his HD site. 2 units PRBC. 1/16: INR 4.3; poor appetite, had bleeding episode in HD on 1/15 and received 2 unit of pRBC  1/23: INR 2.1. Hgb 7.1. Plt 231. 1u PRBC. No bleeding noted.     Goal of Therapy:  INR 2-3 Monitor platelets by anticoagulation protocol: Yes   Plan:  Warfarin 5mg  today  Daily INR   Aven Christen S. Alford Highland, PharmD, BCPS Clinical Staff Pharmacist [email or web address] Alford Highland, [employer] 12/30/2019,8:52 AM

## 2019-12-30 NOTE — Progress Notes (Signed)
Nutrition Follow-up  RD working remotely.  DOCUMENTATION CODES:   Not applicable  INTERVENTION:   - Continue renal MVI daily  -Ensure Enlive po TID, each supplement provides 350 kcal and 20 grams of protein (strawberry flavor)  -d/c Pro-stat due to pt's continued refusal  - Encourage adequate PO intakeand provide feeding assistance  NUTRITION DIAGNOSIS:   Inadequate oral intake related to poor appetite, vomiting as evidenced by meal completion < 25%.  Progressing  GOAL:   Patient will meet greater than or equal to 90% of their needs  Progressing  MONITOR:   PO intake, Supplement acceptance, Labs, Weight trends, I & O's  REASON FOR ASSESSMENT:   Malnutrition Screening Tool    ASSESSMENT:   79 year old male who presented to the ED on 1/07 with generalized weakness and tremors. PMH of ESRD on HD (MTTS), CHF, anemia, atrial fibrillation, PEA arrest in March 2019, GERD, HIV, prostate cancer, CVA, HTN. Pt tested positive for COVID-19.  Pt awaiting SNF placement. Pt is medically stable for d/c.  48-hour calorie count was completed. Pt meeting ~85% of kcal needs and ~80% of protein needs. No plan for Cortrak.  Pt accepting ~90% of Ensure Enlive supplements per MAR. Pt refusing all doses of Pro-stat and stating it will make him vomit. RD to d/c order.  Last HD was 1/25 with 2000 ml net UF. Post-HD weight was 77 kg. EDW is 83.5 kg.  Weight this AM is 4 lbs above admit weight.  Meal Completion: 40-80% x last 8 meals  Medications reviewed and include: vitamin C, calcitriol, pepcid, Ensure Enlive TID, Pro-stat BID, SSI, rena-vit, psyllium, Renvela, warfarin, zinc sulfate  Labs reviewed: hemoglobin 9.1 CBG's: 91-141 x 24 hours  Diet Order:   Diet Order            Diet renal/carb modified with fluid restriction Diet-HS Snack? Nothing; Fluid restriction: 1200 mL Fluid; Room service appropriate? Yes; Fluid consistency: Thin  Diet effective now               EDUCATION NEEDS:   No education needs have been identified at this time  Skin:  Skin Assessment: Skin Integrity Issues: Incisions: right thigh  Last BM:  12/29/19  Height:   Ht Readings from Last 1 Encounters:  12/12/19 6\' 2"  (1.88 m)    Weight:   Wt Readings from Last 1 Encounters:  12/30/19 81 kg    Ideal Body Weight:  86.4 kg  BMI:  Body mass index is 22.93 kg/m.  Estimated Nutritional Needs:   Kcal:  2100-2300  Protein:  105-120 grams  Fluid:  1000 ml + UOP    Gaynell Face, MS, RD, LDN Inpatient Clinical Dietitian Pager: [phone] Weekend/After Hours: [phone]

## 2019-12-30 NOTE — Progress Notes (Signed)
Physical Therapy Treatment Patient Details Name: David [name] MRN: [id number] DOB: [birth year] Today's Date: 12/30/2019    History of Present Illness Patient is a 79 year old male with history of combined systolic and diastolic heart failure (EF 20 to 25% 03/04/2019), iron deficiency anemia, atrial fibrillation on Coumadin only tolerates amiodarone, PEA arrest 02/2018, ESRD on HD MTTS, GERD, HIV, prostate cancer, CVA, hypertension who presented to the ED on 1/7 with complaints of generalized weakness and tremors and not feeling well x5 days.  Found to be Covid +.    PT Comments    Pt more alert today and willing to work with therapy to get out of bed. Pt maxA for bed mobility, and modA for power up from low surfaces and min A from [employer] pads. Pt able to static stand for 1 minute with maximal encouragement and vc for upright posture and posterior pelvic tilt. Once in recliner pt able to perform LE exercises. Pt is excited about getting off the COVID floor and seeing his family. D/c plans remain appropriate at this time. PT will continue to follow acutely.      Follow Up Recommendations  SNF;Supervision/Assistance - 24 hour     Equipment Recommendations  Other (comment)(TBD)    Recommendations for Other Services       Precautions / Restrictions Precautions Precautions: Fall Restrictions Weight Bearing Restrictions: No    Mobility  Bed Mobility Overal bed mobility: Needs Assistance Bed Mobility: Supine to Sit     Supine to sit: Max assist     General bed mobility comments: assist for LEs and trunk elevation, pt   Transfers Overall transfer level: Needs assistance Equipment used: Ambulation equipment used Transfers: Sit to/from Stand Sit to Stand: +2 physical assistance;Mod assist;Min assist         General transfer comment: modA for standing from lower surfaces - bed and recliner and min A for power up from [employer] pads   Ambulation/Gait             General  Gait Details: unable          Balance Overall balance assessment: Needs assistance   Sitting balance-Leahy Scale: Poor   Postural control: Right lateral lean Standing balance support: Bilateral upper extremity supported Standing balance-Leahy Scale: Poor                              Cognition Arousal/Alertness: Awake/alert Behavior During Therapy: Flat affect Overall Cognitive Status: No family/caregiver present to determine baseline cognitive functioning Area of Impairment: Following commands;Safety/judgement;Problem solving;Attention                   Current Attention Level: Alternating   Following Commands: Follows one step commands with increased time Safety/Judgement: Decreased awareness of safety Awareness: Emergent Problem Solving: Slow processing;Decreased initiation;Difficulty sequencing;Requires verbal cues;Requires tactile cues        Exercises General Exercises - Lower Extremity Ankle Circles/Pumps: AROM;Both;10 reps;Seated Long Arc Quad: AROM;Both;10 reps;Seated Hip ABduction/ADduction: AROM;Both;10 reps;Seated Hip Flexion/Marching: AROM;Both;10 reps;Seated Other Exercises Other Exercises: static standing 1 min, vc for upright posture and posterior pelvic tilt     General Comments General comments (skin integrity, edema, etc.): VSS on RA      Pertinent Vitals/Pain Pain Assessment: Faces Faces Pain Scale: Hurts a little bit Pain Location: generalized with movement Pain Descriptors / Indicators: Moaning;Grimacing Pain Intervention(s): Limited activity within patient's tolerance;Monitored during session;Repositioned    Home Living  Prior Function            PT Goals (current goals can now be found in the care plan section) Acute Rehab PT Goals Patient Stated Goal: to feel better  PT Goal Formulation: With patient Time For Goal Achievement: 12/26/19 Potential to Achieve Goals: Good Progress  towards PT goals: Progressing toward goals    Frequency    Min 3X/week      PT Plan Current plan remains appropriate       AM-PAC PT "6 Clicks" Mobility   Outcome Measure  Help needed turning from your back to your side while in a flat bed without using bedrails?: A Lot Help needed moving from lying on your back to sitting on the side of a flat bed without using bedrails?: Total Help needed moving to and from a bed to a chair (including a wheelchair)?: Total Help needed standing up from a chair using your arms (e.g., wheelchair or bedside chair)?: Total Help needed to walk in hospital room?: Total Help needed climbing 3-5 steps with a railing? : Total 6 Click Score: 7    End of Session   Activity Tolerance: Patient limited by fatigue;Patient limited by lethargy Patient left: in chair;with call bell/phone within reach;with chair alarm set Nurse Communication: Mobility status PT Visit Diagnosis: Other abnormalities of gait and mobility (R26.89);Muscle weakness (generalized) (M62.81);Other symptoms and signs involving the nervous system (U13.244)     Time: 0102-7253 PT Time Calculation (min) (ACUTE ONLY): 23 min  Charges:  $Therapeutic Exercise: 8-22 mins $Therapeutic Activity: 8-22 mins                     David [name] B. Migdalia Dk PT, DPT Acute Rehabilitation Services Pager [phone] Office [phone]    [place] 12/30/2019, 5:44 PM

## 2019-12-30 NOTE — Progress Notes (Addendum)
[facility] Progress Note   OP HD: [facility] (MWF here) 4h 400/1.5 83.5kg 2/2 bath P4 R TDC/ new R thigh AVG 12/02/19 Hep 2000 - Mircera 58mcg IV q 2 weeks (last 12/06/19) - Calcitriol 0.56mcg PO q HD  Assessment/ Plan:   1)End-stage renal disease -outpt HD is [facility]. Had extra HD last week x 4 (10h total) for nausea/ uremia/ solute control and this week is feeling better w/ resolved nausea and creat 5  - this week plan is 3.5h sessions on MWF and poss 2 hr Sat  - had failure of RUE AVG in aug 2020, new L thigh AVG placed 12/07 which failed so new R thigh AVG placed 12/02/19  - new R thigh AVG placed 12/29 is 4 wks postop, will plan to use tomorrow  2)Nausea/ poor po intake- resolved, eating , no nausea as above  3)COVID-19 infection -Therapies per primary team - s/p  IV dexamethasone and  remdesivir - tested +here on 12/11/19 so quarantine should be up around 1/27- 1/28?   4)Combined chronic systolic and diastolic chronic end-stage heart failure(20% EF) - optimize volume with HD as able -Status post cardiology evaluation with little therapy to add given his hypotension  - dry wt was 83.5k, lowest wt here 78.5kg, euvolemic on exam  5)DNR - appreciate pall care assistance  6)Chronic hypotension - onmidodrine  7)Anemia secondary to CKD - On ESA-last dosed on 1/2 as above - had blood loss from HD cathat HDFriday, HD cath checked and therewas no crackso HD completed;gave IV vancx1. Hb dropped to 7.9. given 1U 1/15 - Dosed  aranesp 200 (1/22)  8)Secondary hyperparathyroidism -Will continue calcitriol inpatient and will be administered at his dialysis unit upon discharge.renal diet. On velphoro->upsetting stomach (stopped) and incr renvela to 3tidm on 1/22.  Will recheck on Mon + give laxative as well.  9)H/o CVA  10)HIV - on meds  11)Atrial fibrillation - appears chronic   Kelly Splinter  MD 12/30/2019, 10:59  AM    Subjective:    Patient not examined today directly given COVID-19 + status, utilizing data taken from chart +/- discussions w/ providers and staff.     Objective:   BP (!) 110/58 (BP Location: Left Arm)   Pulse 78   Temp 98.9 F (37.2 C) (Oral)   Resp 18   Ht 6\' 2"  (1.88 m)   Wt 81 kg   SpO2 100%   BMI 22.93 kg/m   Intake/Output Summary (Last 24 hours) at 12/30/2019 1059 Last data filed at 12/29/2019 1425 Gross per 24 hour  Intake -  Output 2000 ml  Net -2000 ml   Weight change: -3.2 kg   Physical Exam:  Patient not examined today directly given COVID-19 + status, utilizing data taken from chart +/- discussions w/ providers and staff.      Medications:    . allopurinol  100 mg Oral Once per day on Mon Wed Fri  . amiodarone  200 mg Oral Daily  . vitamin C  500 mg Oral Daily  . bictegravir-emtricitabine-tenofovir AF  1 tablet Oral Daily  . brimonidine  1 drop Left Eye BID  . calcitRIOL  0.5 mcg Oral Q M,W,F-HD  . Chlorhexidine Gluconate Cloth  6 each Topical Q0600  . darbepoetin (ARANESP) injection - DIALYSIS  200 mcg Intravenous Q Fri-HD  . dorzolamide-timolol  1 drop Left Eye BID  . famotidine  10 mg Oral Daily  . feeding supplement (ENSURE ENLIVE)  237 mL Oral TID BM  .  feeding supplement (PRO-STAT SUGAR FREE 64)  30 mL Oral BID  . insulin aspart  0-9 Units Subcutaneous TID WC  . latanoprost  1 drop Left Eye QHS  . midodrine  10 mg Oral Q M,W,F-HD  . montelukast  10 mg Oral QHS  . multivitamin  1 tablet Oral QHS  . psyllium  1 packet Oral Daily  . sevelamer carbonate  2,400 mg Oral TID WC  . warfarin  5 mg Oral ONCE-1800  . Warfarin - Pharmacist Dosing Inpatient   Does not apply q1800  . zinc sulfate  220 mg Oral Daily

## 2019-12-31 LAB — CBC
HCT: 27.3 % — ABNORMAL LOW (ref 39.0–52.0)
Hemoglobin: 8.6 g/dL — ABNORMAL LOW (ref 13.0–17.0)
MCH: 33.2 pg (ref 26.0–34.0)
MCHC: 31.5 g/dL (ref 30.0–36.0)
MCV: 105.4 fL — ABNORMAL HIGH (ref 80.0–100.0)
Platelets: 153 10*3/uL (ref 150–400)
RBC: 2.59 MIL/uL — ABNORMAL LOW (ref 4.22–5.81)
RDW: 19.9 % — ABNORMAL HIGH (ref 11.5–15.5)
WBC: 7.5 10*3/uL (ref 4.0–10.5)
nRBC: 0 % (ref 0.0–0.2)

## 2019-12-31 LAB — PROTIME-INR
INR: 2.5 — ABNORMAL HIGH (ref 0.8–1.2)
Prothrombin Time: 26.5 seconds — ABNORMAL HIGH (ref 11.4–15.2)

## 2019-12-31 LAB — GLUCOSE, CAPILLARY
Glucose-Capillary: 112 mg/dL — ABNORMAL HIGH (ref 70–99)
Glucose-Capillary: 90 mg/dL (ref 70–99)
Glucose-Capillary: 156 mg/dL — ABNORMAL HIGH (ref 70–99)
Glucose-Capillary: 133 mg/dL — ABNORMAL HIGH (ref 70–99)

## 2019-12-31 LAB — RENAL FUNCTION PANEL
Albumin: 2.3 g/dL — ABNORMAL LOW (ref 3.5–5.0)
Anion gap: 13 (ref 5–15)
BUN: 58 mg/dL — ABNORMAL HIGH (ref 8–23)
CO2: 26 mmol/L (ref 22–32)
Calcium: 8.3 mg/dL — ABNORMAL LOW (ref 8.9–10.3)
Chloride: 97 mmol/L — ABNORMAL LOW (ref 98–111)
Creatinine, Ser: 8.39 mg/dL — ABNORMAL HIGH (ref 0.61–1.24)
GFR calc Af Amer: 6 mL/min — ABNORMAL LOW (ref >60)
GFR calc non Af Amer: 6 mL/min — ABNORMAL LOW (ref >60)
Glucose, Bld: 99 mg/dL (ref 70–99)
Phosphorus: 3.8 mg/dL (ref 2.5–4.6)
Potassium: 4.7 mmol/L (ref 3.5–5.1)
Sodium: 136 mmol/L (ref 135–145)

## 2019-12-31 MED ORDER — WARFARIN SODIUM 5 MG PO TABS
5.0000 mg | ORAL_TABLET | Freq: Once | ORAL | Status: AC
  Administered 2019-12-31: 18:00:00 5 mg via ORAL
  Filled 2019-12-31: qty 1

## 2019-12-31 MED ORDER — HEPARIN SODIUM (PORCINE) 1000 UNIT/ML DIALYSIS: 2000.0000 [IU] | Freq: Once | INTRAMUSCULAR | Status: DC

## 2019-12-31 NOTE — Progress Notes (Signed)
Called and updated with Mrs Ackerley.

## 2019-12-31 NOTE — Progress Notes (Signed)
Patient ID: David [name], male   DOB: Oct 28, 1941, 79 y.o.   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 During the hospitalization, patient was not started on any treatment for Covid as he was not hypoxic however, subsequently his respiratory rate increased and CRP was 22.9 and was subsequently started on dexamethasone and remdesivir on 12/12/2019 He was found to have elevated troponin: Cardiology was consulted.  Cardiology deemed the patient not a candidate for cardiac intervention and troponin elevation was secondary to demand ischemia and no therapies recommended. Nephrology was consulted as well. Palliative Care was consulted and subsequently patient was made a DNR. Hospital course complicated by ongoing failure to thrive, poor p.o. intake, palliative care conversations ongoing; finally with improving oral intake and now plan for SNF placement with palliative care follow-up. Remained stable awaiting for rehabilitation.  Will complete Covid isolation on 01/01/2020.  Assessment & Plan:   COVID-19 virus infection -Tested positive on 12/11/2019 -Has completed 5 days of remdesivir, 4 days of dexamethasone which was stopped on 12/15/2019 due to uremia (BUN to 99) -Respiratory status has improved and stable.  Currently on room air -Remains debilitated and frail.  PT evaluation recommended SNF placement.  Social  worker following. -We will complete Covid isolation on 01/01/2020  Nausea and vomiting -Most likely from Covid infection and uremia.  Resolved  Elevated troponin in a patient with extensive cardiac history End-stage heart failure, systolic and diastolic combined CHF History of PEA arrest -EF of 20% -Troponin mildly elevated during admission but did not trend up.  EKG unchanged with baseline LBBB -Cardiology consulted-no indication for cardiac intervention due to end-stage heart failure and recommending optimizing volume status with hemodialysis -Palliative care consulted, as above goals of care conversation had and he is now DNR -Poor prognosis  End-stage renal disease on hemodialysis Hyperkalemia: Resolved -Nephrology following.  Dialysis as per nephrology schedule.  Continue midodrine on dialysis days.  Chronic hypotension -Continue midodrine on dialysis days  Anemia of chronic disease/microcytic anemia -Hemoglobin stable.  Monitor intermittently  Paroxysmal A. fib  -on Coumadin and amiodarone.  Currently rate controlled.  INR therapeutic  Chronic pain -Continue with tramadol  History of HIV -Stable.  Continue [name].  Last CD4 count was 413 in 09/2019 with undetectable viral load  Prediabetes -Continue SSI  Ambulatory dysfunction Generalized deconditioning -PT/OT recommending SNF.  Social worker following.  Palliative care has evaluated the patient and patient is currently DNR.  Palliative care recommending palliative care follow-up as an outpatient.  DVT prophylaxis: Coumadin Code Status: DNR Family Communication: None at bedside Disposition Plan: SNF once bed is available.  Currently medically stable for discharge.  Consultants: Cardiology/nephrology/palliative care  Procedures: None  Antimicrobials: Has already completed remdesivir treatment   Subjective: Patient seen and examined at bedside.  He is a poor historian.  Denies any overnight fever, nausea or  vomiting.  Objective: Vitals:   12/30/19 1430 12/30/19 2110 12/31/19 0434 12/31/19 0702  BP: (!) 128/56 (!) 112/57 (!) 111/54 115/62  Pulse: 75 74 69   Resp: 18 18 18    Temp: (!) 97.3 F (36.3 C) 98.3 F (36.8 C) 97.8 F (36.6 C)   TempSrc: Oral Oral    SpO2: 99% 100% 100%   Weight:      Height:        Intake/Output Summary (Last 24 hours) at 12/31/2019 0929 Last data filed at 12/31/2019 0300 Gross per 24 hour  Intake 540 ml  Output --  Net 540 ml   Filed Weights   12/29/19 1111 12/29/19 1425 12/30/19 0505  Weight: 79.1 kg 77 kg 81 kg    Examination:  General exam: Appears calm and comfortable.  Elderly male lying in bed.  Poor historian. Respiratory system: Bilateral decreased breath sounds at bases with some scattered crackles Cardiovascular system: S1 & S2 heard, Rate controlled Gastrointestinal system: Abdomen is nondistended, soft and nontender. Normal bowel sounds heard. Extremities: No cyanosis, clubbing; trace lower extremity edema      Data Reviewed: I have personally reviewed following labs and imaging studies  CBC: Recent Labs  Lab 12/27/19 0439 12/28/19 0230 12/29/19 0359 12/30/19 0535 12/31/19 0356  WBC 11.0* 11.9* 8.2 7.8 7.5  HGB 7.1* 9.6* 8.4* 9.1* 8.6*  HCT 21.7* 29.5* 26.0* 27.5* 27.3*  MCV 107.4* 101.4* 103.2* 104.2* 105.4*  PLT 231 233 160 157 119   Basic Metabolic Panel: Recent Labs  Lab 12/25/19 0613 12/26/19 0846 12/27/19 0439 12/28/19 0230 12/29/19 0359 12/31/19 0356  NA 134* 133* 133* 137  --  136  K 5.0 5.6* 4.9 4.7  --  4.7  CL 92* 90* 93* 97*  --  97*  CO2 24 23 26 27   --  26  GLUCOSE 139* 108* 131* 89  --  99  BUN 107* 140* 84* 44*  --  58*  CREATININE 9.46* 11.49* 8.03* 5.35*  --  8.39*  CALCIUM 8.8* 8.8* 8.7* 8.8*  --  8.3*  PHOS  --   --   --   --  4.2 3.8   GFR: Estimated Creatinine Clearance: 8.4 mL/min (A) (by C-G formula based on SCr of 8.39 mg/dL (H)). Liver Function Tests: Recent Labs  Lab  12/31/19 0356  ALBUMIN 2.3*   No results for input(s): LIPASE, AMYLASE in the last 168 hours. No results for input(s): AMMONIA in the last 168 hours. Coagulation Profile: Recent Labs  Lab 12/27/19 0439 12/28/19 0230 12/29/19 0359 12/30/19 0535 12/31/19 0356  INR 2.1* 1.6* 1.6* 2.0* 2.5*   Cardiac Enzymes: No results for input(s): CKTOTAL, CKMB, CKMBINDEX, TROPONINI in the last 168 hours. BNP (last 3 results) No results for input(s): PROBNP in the last 8760 hours. HbA1C: No results for input(s): HGBA1C in the last 72 hours. CBG: Recent Labs  Lab 12/30/19 1144 12/30/19 1651 12/30/19 2107 12/31/19 0658 12/31/19 0758  GLUCAP 138* 104* 123* 112* 90   Lipid Profile: No results for input(s): CHOL, HDL, LDLCALC, TRIG, CHOLHDL, LDLDIRECT in the last 72 hours. Thyroid Function Tests: No results for input(s): TSH, T4TOTAL, FREET4, T3FREE, THYROIDAB in the last 72 hours. Anemia Panel: No results for input(s): VITAMINB12, FOLATE, FERRITIN, TIBC, IRON, RETICCTPCT in the last 72 hours. Sepsis Labs: No results for input(s): PROCALCITON, LATICACIDVEN in the last 168 hours.  No results found for this or any previous visit (from the past 240 hour(s)).       Radiology Studies: No results found.      Scheduled Meds: . allopurinol  100 mg Oral Once per day on Mon Wed  Fri  . amiodarone  200 mg Oral Daily  . vitamin C  500 mg Oral Daily  . bictegravir-emtricitabine-tenofovir AF  1 tablet Oral Daily  . brimonidine  1 drop Left Eye BID  . calcitRIOL  0.5 mcg Oral Q M,W,F-HD  . Chlorhexidine Gluconate Cloth  6 each Topical Q0600  . Chlorhexidine Gluconate Cloth  6 each Topical Q0600  . darbepoetin (ARANESP) injection - DIALYSIS  200 mcg Intravenous Q Fri-HD  . dorzolamide-timolol  1 drop Left Eye BID  . famotidine  10 mg Oral Daily  . feeding supplement (ENSURE ENLIVE)  237 mL Oral TID BM  . insulin aspart  0-9 Units Subcutaneous TID WC  . latanoprost  1 drop Left Eye QHS  .  midodrine  10 mg Oral Q M,W,F-HD  . montelukast  10 mg Oral QHS  . multivitamin  1 tablet Oral QHS  . psyllium  1 packet Oral Daily  . sevelamer carbonate  2,400 mg Oral TID WC  . warfarin  5 mg Oral ONCE-1800  . Warfarin - Pharmacist Dosing Inpatient   Does not apply q1800  . zinc sulfate  220 mg Oral Daily   Continuous Infusions:        Aline August, MD [facility] 12/31/2019, 9:29 AM

## 2019-12-31 NOTE — Progress Notes (Signed)
ANTICOAGULATION CONSULT NOTE - Follow Up Consult  Pharmacy Consult for Coumadin Indication: atrial fibrillation  Allergies  Allergen Reactions  . Dextromethorphan-Guaifenesin     UNKNOWN   . Tocotrienols     UNKNOWN   . Losartan Potassium Other (See Comments)    Causes constipation    Patient Measurements: Height: 6\' 2"  (188 cm) Weight: 178 lb 9.2 oz (81 kg) IBW/kg (Calculated) : 82.2  Vital Signs: Temp: 97.8 F (36.6 C) (01/27 0434) Temp Source: Oral (01/26 2110) BP: 115/62 (01/27 0702) Pulse Rate: 69 (01/27 0434)  Labs: Recent Labs    12/29/19 0359 12/29/19 0359 12/30/19 0535 12/31/19 0356  HGB 8.4*   < > 9.1* 8.6*  HCT 26.0*  --  27.5* 27.3*  PLT 160  --  157 153  LABPROT 18.7*  --  22.4* 26.5*  INR 1.6*  --  2.0* 2.5*  CREATININE  --   --   --  8.39*   < > = values in this interval not displayed.    Estimated Creatinine Clearance: 8.4 mL/min (A) (by C-G formula based on SCr of 8.39 mg/dL (H)).  Assessment:  Anticoag: Warfarin for hx afib (CHADS2VASc = 8)- d-dimer 5.26 (last 1/23). INR 1.6>2>2.5 today. Hgb 8.6 down slightly. Plts down to 153. - Warfarin PTA 2.5mg  MF, 5mg  AOD (INR 1.2 on admit) - on amiodarone also PTA. .  1/15: (clot >> bleed risk). rapid response was called on patient for excessive bleeding around his HD site. 2 units PRBC. 1/16: INR 4.3; poor appetite, had bleeding episode in HD on 1/15 and received 2 unit of pRBC  1/23: INR 2.1. Hgb 7.1. Plt 231. 1u PRBC. No bleeding noted.     Goal of Therapy:  INR 2-3 Monitor platelets by anticoagulation protocol: Yes   Plan:  Warfarin 5mg  today  Daily INR   Rashay Barnette S. Alford Highland, PharmD, BCPS Clinical Staff Pharmacist [email or web address] Alford Highland, [employer] 12/31/2019,8:31 AM

## 2019-12-31 NOTE — Progress Notes (Signed)
[facility] Progress Note   [facility] HD: [facility] (MWF here) 4h 400/1.5 83.5kg 2/2 bath P4 R TDC/ new R thigh AVG 12/02/19 Hep 2000 - Mircera 26mcg IV q 2 weeks (last 12/06/19) - Calcitriol 0.13mcg PO q HD  Assessment/ Plan:   1)End-stage renal disease -outpt HD is [facility], doing MWF here w/ Sat as needed - uremia/ nausea - had extra HD last week for nausea/ uremia/ solute control and this week is feeling better w/ resolved nausea and creat 5  - this week plan is 3.5h sessions on MWF and poss 2 hr Sat  - had failure of RUE AVG in aug 2020, new L thigh AVG placed 12/07 which failed and then new R thigh AVG placed 12/02/19 and is now 4 wks postop > will use AVG today  2)Nausea/ poor po intake- resolved, eating , no nausea as above  3)COVID-19 infection -Therapies per primary team - s/p  IV dexamethasone and  remdesivir - COVID isolation will end on 1/28 tomorrow   4)Combined chronic systolic and diastolic chronic end-stage heart failure(20% EF) - optimize volume with HD as able -Status post cardiology evaluation with little therapy to add given his hypotension  - dry wt was 83.5k, lowest wt here 78.5kg, euvolemic on exam  5)DNR - appreciate pall care assistance  6)Chronic hypotension - onmidodrine  7)Anemia secondary to CKD - On ESA-last dosed on 1/2 as above - had blood loss from HD cathat HDFriday, HD cath checked and therewas no crackso HD completed;gave IV vancx1. Hb dropped to 7.9. given 1U 1/15 - Dosed  aranesp 200 (1/22)  8)Secondary hyperparathyroidism -Will continue calcitriol inpatient and will be administered at his dialysis unit upon discharge.renal diet. On velphoro->upsetting stomach (stopped) and incr renvela to 3tidm on 1/22.  Will recheck on Mon + give laxative as well.  9)H/o CVA  10)HIV - on meds  11)Atrial fibrillation - appears chronic   Kelly Splinter  MD 12/31/2019, 1:28 PM    Subjective:    Seen on HD< no c/o, not getting much UF due ot low BP's. Pt eating good   Objective:   BP (!) 97/50   Pulse 66   Temp 97.9 F (36.6 C) (Oral)   Resp 17   Ht 6\' 2"  (1.88 m)   Wt 78.5 kg   SpO2 100%   BMI 22.22 kg/m   Intake/Output Summary (Last 24 hours) at 12/31/2019 1328 Last data filed at 12/31/2019 1250 Gross per 24 hour  Intake 240 ml  Output 753 ml  Net -513 ml   Weight change:    Physical Exam: GEN: NAD, A&Ox3, NCAT HEENT: No conjunctival pallor, EOMI NECK: Supple, no thyromegaly LUNGS: CTA B/L no rales, rhonchi or wheezing FA:[id number] irreg ABD: SNDNT +BS  EXT: No lower extremity edema ACCESS: rt thigh AVG +pulse, RIJ TC     Medications:    . allopurinol  100 mg Oral Once per day on Mon Wed Fri  . amiodarone  200 mg Oral Daily  . vitamin C  500 mg Oral Daily  . bictegravir-emtricitabine-tenofovir AF  1 tablet Oral Daily  . brimonidine  1 drop Left Eye BID  . calcitRIOL  0.5 mcg Oral Q M,W,F-HD  . Chlorhexidine Gluconate Cloth  6 each Topical Q0600  . Chlorhexidine Gluconate Cloth  6 each Topical Q0600  . darbepoetin (ARANESP) injection - DIALYSIS  200 mcg Intravenous Q Fri-HD  . dorzolamide-timolol  1 drop Left Eye BID  . famotidine  10 mg Oral  Daily  . feeding supplement (ENSURE ENLIVE)  237 mL Oral TID BM  . [START ON 01/01/2020] heparin  2,000 Units Dialysis Once in dialysis  . insulin aspart  0-9 Units Subcutaneous TID WC  . latanoprost  1 drop Left Eye QHS  . midodrine  10 mg Oral Q M,W,F-HD  . montelukast  10 mg Oral QHS  . multivitamin  1 tablet Oral QHS  . psyllium  1 packet Oral Daily  . sevelamer carbonate  2,400 mg Oral TID WC  . warfarin  5 mg Oral ONCE-1800  . Warfarin - Pharmacist Dosing Inpatient   Does not apply q1800  . zinc sulfate  220 mg Oral Daily

## 2019-12-31 NOTE — Discharge Instructions (Addendum)
Follow with Primary MD [name], NP in 7 days   Get CBC, CMP, INR, 2 view Chest X ray -  checked next visit within 2-3 days by Primary MD or SNF MD    Activity: As tolerated with Full fall precautions use walker/cane & assistance as needed  Disposition SNF  Diet: Renal-Low Carb , with feeding assistance and aspiration precautions.  Accuchecks 4 times/day, Once in AM empty stomach and then before each meal. Log in all results and show them to your Prim.MD in 3 days. If any glucose reading is under 80 or above 300 call your Prim MD immidiately. Follow Low glucose instructions for glucose under 80 as instructed.   Special Instructions: If you have smoked or chewed Tobacco  in the last 2 yrs please stop smoking, stop any regular Alcohol  and or any Recreational drug use.  On your next visit with your primary care physician please Get Medicines reviewed and adjusted.  Please request your Prim.MD to go over all Hospital Tests and Procedure/Radiological results at the follow up, please get all Hospital records sent to your Prim MD by signing hospital release before you go home.  If you experience worsening of your admission symptoms, develop shortness of breath, life threatening emergency, suicidal or homicidal thoughts you must seek medical attention immediately by calling 911 or calling your MD immediately  if symptoms less severe.  You Must read complete instructions/literature along with all the possible adverse reactions/side effects for all the Medicines you take and that have been prescribed to you. Take any new Medicines after you have completely understood and accpet all the possible adverse reactions/side effects.      Information on my medicine - Coumadin   (Warfarin)  Why was Coumadin prescribed for you? Coumadin was prescribed for you because you have a blood clot or a medical condition that can cause an increased risk of forming blood clots. Blood clots can cause  serious health problems by blocking the flow of blood to the heart, lung, or brain. Coumadin can prevent harmful blood clots from forming. As a reminder your indication for Coumadin is:   Stroke Prevention Because Of Atrial Fibrillation  What test will check on my response to Coumadin? While on Coumadin (warfarin) you will need to have an INR test regularly to ensure that your dose is keeping you in the desired range. The INR (international normalized ratio) number is calculated from the result of the laboratory test called prothrombin time (PT).  If an INR APPOINTMENT HAS NOT ALREADY BEEN MADE FOR YOU please schedule an appointment to have this lab work done by your health care provider within 7 days. Your INR goal is usually a number between:  2 to 3 or your provider may give you a more narrow range like 2-2.5.  Ask your health care provider during an office visit what your goal INR is.  What  do you need to  know  About  COUMADIN? Take Coumadin (warfarin) exactly as prescribed by your healthcare provider about the same time each day.  DO NOT stop taking without talking to the doctor who prescribed the medication.  Stopping without other blood clot prevention medication to take the place of Coumadin may increase your risk of developing a new clot or stroke.  Get refills before you run out.  What do you do if you miss a dose? If you miss a dose, take it as soon as you remember on the same day then continue your  regularly scheduled regimen the next day.  Do not take two doses of Coumadin at the same time.  Important Safety Information A possible side effect of Coumadin (Warfarin) is an increased risk of bleeding. You should call your healthcare provider right away if you experience any of the following: ? Bleeding from an injury or your nose that does not stop. ? Unusual colored urine (red or dark brown) or unusual colored stools (red or black). ? Unusual bruising for unknown reasons. ? A serious  fall or if you hit your head (even if there is no bleeding).  Some foods or medicines interact with Coumadin (warfarin) and might alter your response to warfarin. To help avoid this: ? Eat a balanced diet, maintaining a consistent amount of Vitamin K. ? Notify your provider about major diet changes you plan to make. ? Avoid alcohol or limit your intake to 1 drink for women and 2 drinks for men per day. (1 drink is 5 oz. wine, 12 oz. beer, or 1.5 oz. liquor.)  Make sure that ANY health care provider who prescribes medication for you knows that you are taking Coumadin (warfarin).  Also make sure the healthcare provider who is monitoring your Coumadin knows when you have started a new medication including herbals and non-prescription products.  Coumadin (Warfarin)  Major Drug Interactions  Increased Warfarin Effect Decreased Warfarin Effect  Alcohol (large quantities) Antibiotics (esp. Septra/Bactrim, Flagyl, Cipro) Amiodarone (Cordarone) Aspirin (ASA) Cimetidine (Tagamet) Megestrol (Megace) NSAIDs (ibuprofen, naproxen, etc.) Piroxicam (Feldene) Propafenone (Rythmol SR) Propranolol (Inderal) Isoniazid (INH) Posaconazole (Noxafil) Barbiturates (Phenobarbital) Carbamazepine (Tegretol) Chlordiazepoxide (Librium) Cholestyramine (Questran) Griseofulvin Oral Contraceptives Rifampin Sucralfate (Carafate) Vitamin K   Coumadin (Warfarin) Major Herbal Interactions  Increased Warfarin Effect Decreased Warfarin Effect  Garlic Ginseng Ginkgo biloba Coenzyme Q10 Green tea St. John's wort    Coumadin (Warfarin) FOOD Interactions  Eat a consistent number of servings per week of foods HIGH in Vitamin K (1 serving =  cup)  Collards (cooked, or boiled & drained) Kale (cooked, or boiled & drained) Mustard greens (cooked, or boiled & drained) Parsley *serving size only =  cup Spinach (cooked, or boiled & drained) Swiss chard (cooked, or boiled & drained) Turnip greens (cooked, or  boiled & drained)  Eat a consistent number of servings per week of foods MEDIUM-HIGH in Vitamin K (1 serving = 1 cup)  Asparagus (cooked, or boiled & drained) Broccoli (cooked, boiled & drained, or raw & chopped) Brussel sprouts (cooked, or boiled & drained) *serving size only =  cup Lettuce, raw (green leaf, endive, romaine) Spinach, raw Turnip greens, raw & chopped   These websites have more information on Coumadin (warfarin):  FailFactory.se; VeganReport.com.au;

## 2020-01-01 LAB — CBC
HCT: 27.1 % — ABNORMAL LOW (ref 39.0–52.0)
Hemoglobin: 8.7 g/dL — ABNORMAL LOW (ref 13.0–17.0)
MCH: 33.9 pg (ref 26.0–34.0)
MCHC: 32.1 g/dL (ref 30.0–36.0)
MCV: 105.4 fL — ABNORMAL HIGH (ref 80.0–100.0)
Platelets: 149 10*3/uL — ABNORMAL LOW (ref 150–400)
RBC: 2.57 MIL/uL — ABNORMAL LOW (ref 4.22–5.81)
RDW: 20 % — ABNORMAL HIGH (ref 11.5–15.5)
WBC: 7.7 10*3/uL (ref 4.0–10.5)
nRBC: 0 % (ref 0.0–0.2)

## 2020-01-01 LAB — PROTIME-INR
INR: 2.5 — ABNORMAL HIGH (ref 0.8–1.2)
Prothrombin Time: 26.6 seconds — ABNORMAL HIGH (ref 11.4–15.2)

## 2020-01-01 LAB — GLUCOSE, CAPILLARY
Glucose-Capillary: 89 mg/dL (ref 70–99)
Glucose-Capillary: 143 mg/dL — ABNORMAL HIGH (ref 70–99)
Glucose-Capillary: 96 mg/dL (ref 70–99)
Glucose-Capillary: 132 mg/dL — ABNORMAL HIGH (ref 70–99)

## 2020-01-01 MED ORDER — CHLORHEXIDINE GLUCONATE CLOTH 2 % EX PADS: 6.0000 | MEDICATED_PAD | Freq: Every day | CUTANEOUS | Status: DC

## 2020-01-01 MED ORDER — WARFARIN SODIUM 5 MG PO TABS
5.0000 mg | ORAL_TABLET | Freq: Every day | ORAL | Status: DC
  Administered 2020-01-01 – 2020-01-02 (×2): 5 mg via ORAL
  Filled 2020-01-01 (×2): qty 1

## 2020-01-01 NOTE — Progress Notes (Signed)
[facility] Progress Note   [facility] (MWF here) 4h 400/1.5 83.5kg 2/2 bath P4 R TDC/ new R thigh AVG Hep 2000 - Mircera 47mcg IV q 2 weeks (last 12/06/19) - Calcitriol 0.49mcg PO q HD  Assessment/ Plan:   1)End-stage renal disease -outpt HD is MTTS, doing MWF here w/ Sat as needed - uremia/ nausea better w/ extra HD last week  - this week plan 3.5h sessions MWF and poss 2 hr Sat  - had failure of RUE AVG in aug 2020, new L thigh AVG placed 12/07 which failed  - new R thigh AVG placed 12/02/19 we used yest for HD, cont to use AVG  - HD tomorrow, no fluid off, wt's stable and down  - still awaiting SNF placement  2)Nausea/ poor po intake- resolved, eating , no nausea as above  3)COVID-19 infection -Therapies per primary team - s/p  IV dexamethasone and  remdesivir - COVID isolation will end on 1/28 today   4)Combined chronic systolic and diastolic chronic end-stage heart failure(20% EF) - optimize volume with HD as able -Status post cardiology evaluation with little therapy to add given his hypotension  - dry wt was 83.5k, lowest wt here 78.5kg, euvolemic on exam  5)DNR - appreciate pall care assistance  6)Chronic hypotension - onmidodrine  7)Anemia secondary to CKD - On ESA-last dosed on 1/2 as above - had blood loss from HD cathat HDFriday, HD cath checked and therewas no crackso HD completed;gave IV vancx1. Hb dropped to 7.9. given 1U 1/15 - Dosed  aranesp 200 (1/22)  8)Secondary hyperparathyroidism -Will continue calcitriol inpatient and will be administered at his dialysis unit upon discharge.renal diet. On velphoro->upsetting stomach (stopped) and incr renvela to 3tidm on 1/22.  Will recheck on Mon + give laxative as well.  9)H/o CVA  10)HIV - on meds  11)Atrial fibrillation - appears chronic   Kelly Splinter  MD 01/01/2020, 12:38 PM    Subjective:   Seen on HD< no c/o, not getting much  UF due ot low BP's. Pt eating good   Objective:   BP (!) 116/57 (BP Location: Left Arm)   Pulse 76   Temp 97.9 F (36.6 C) (Oral)   Resp 18   Ht 6\' 2"  (1.88 m)   Wt 79 kg   SpO2 98%   BMI 22.36 kg/m   Intake/Output Summary (Last 24 hours) at 01/01/2020 1238 Last data filed at 12/31/2019 1250 Gross per 24 hour  Intake --  Output 753 ml  Net -753 ml   Weight change:    Physical Exam: GEN: NAD, A&Ox3, NCAT HEENT: No conjunctival pallor, EOMI NECK: Supple, no thyromegaly LUNGS: CTA B/L no rales, rhonchi or wheezing OE:UMPNT irreg ABD: SNDNT +BS  EXT: No lower extremity edema ACCESS: rt thigh AVG +pulse, RIJ TC     Medications:    . allopurinol  100 mg Oral Once per day on Mon Wed Fri  . amiodarone  200 mg Oral Daily  . vitamin C  500 mg Oral Daily  . bictegravir-emtricitabine-tenofovir AF  1 tablet Oral Daily  . brimonidine  1 drop Left Eye BID  . calcitRIOL  0.5 mcg Oral Q M,W,F-HD  . Chlorhexidine Gluconate Cloth  6 each Topical Q0600  . Chlorhexidine Gluconate Cloth  6 each Topical Q0600  . darbepoetin (ARANESP) injection - DIALYSIS  200 mcg Intravenous Q Fri-HD  . dorzolamide-timolol  1 drop Left Eye BID  . famotidine  10 mg Oral Daily  .  feeding supplement (ENSURE ENLIVE)  237 mL Oral TID BM  . heparin  2,000 Units Dialysis Once in dialysis  . insulin aspart  0-9 Units Subcutaneous TID WC  . latanoprost  1 drop Left Eye QHS  . midodrine  10 mg Oral Q M,W,F-HD  . montelukast  10 mg Oral QHS  . multivitamin  1 tablet Oral QHS  . psyllium  1 packet Oral Daily  . sevelamer carbonate  2,400 mg Oral TID WC  . warfarin  5 mg Oral q1800  . Warfarin - Pharmacist Dosing Inpatient   Does not apply q1800  . zinc sulfate  220 mg Oral Daily

## 2020-01-01 NOTE — Progress Notes (Signed)
Patient ID: David [name], male   DOB: 07-18-1941, 79 y.o.   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 During the hospitalization, patient was not started on any treatment for Covid as he was not hypoxic however, subsequently his respiratory rate increased and CRP was 22.9 and was subsequently started on dexamethasone and remdesivir on 12/12/2019 He was found to have elevated troponin: Cardiology was consulted.  Cardiology deemed the patient not a candidate for cardiac intervention and troponin elevation was secondary to demand ischemia and no therapies recommended. Nephrology was consulted as well. Palliative Care was consulted and subsequently patient was made a DNR. Hospital course complicated by ongoing failure to thrive, poor p.o. intake, palliative care conversations ongoing; finally with improving oral intake and now plan for SNF placement with palliative care follow-up. Remained stable awaiting for rehabilitation.  Will complete Covid isolation on 01/01/2020.  Assessment & Plan:   COVID-19 virus infection -Tested positive on 12/11/2019 -Has completed 5 days of remdesivir, 4 days of dexamethasone which was stopped on 12/15/2019 due to uremia (BUN to 99) -Respiratory status has improved and stable.  Currently on room air -Remains debilitated and frail.  PT evaluation recommended SNF placement.  Social  worker following. -will complete Covid isolation on 01/01/2020  Nausea and vomiting -Most likely from Covid infection and uremia.  Resolved  Elevated troponin in a patient with extensive cardiac history End-stage heart failure, systolic and diastolic combined CHF History of PEA arrest -EF of 20% -Troponin mildly elevated during admission but did not trend up.  EKG unchanged with baseline LBBB -Cardiology consulted-no indication for cardiac intervention due to end-stage heart failure and recommending optimizing volume status with hemodialysis -Palliative care consulted, as above goals of care conversation had and he is now DNR -Poor prognosis  End-stage renal disease on hemodialysis Hyperkalemia: Resolved -Nephrology following.  Dialysis as per nephrology schedule.  Continue midodrine on dialysis days.  Chronic hypotension -Continue midodrine on dialysis days  Anemia of chronic disease/microcytic anemia -Hemoglobin stable.  Monitor intermittently  Paroxysmal A. fib  -on Coumadin and amiodarone.  Currently rate controlled.  INR therapeutic  Chronic pain -Continue with tramadol  History of HIV -Stable.  Continue [name].  Last CD4 count was 413 in 09/2019 with undetectable viral load  Prediabetes -Continue SSI  Ambulatory dysfunction Generalized deconditioning -PT/OT recommending SNF.  Social worker following.  Palliative care has evaluated the patient and patient is currently DNR.  Palliative care recommending palliative care follow-up as an outpatient.  DVT prophylaxis: Coumadin Code Status: DNR Family Communication: None at bedside Disposition Plan: SNF once bed is available.  Currently medically stable for discharge.  Consultants: Cardiology/nephrology/palliative care  Procedures: None  Antimicrobials: Has already completed remdesivir treatment   Subjective: Patient seen and examined at bedside.  Poor historian.  No overnight fever, worsening shortness of breath  or vomiting reported. Objective: Vitals:   12/31/19 1250 12/31/19 1300 12/31/19 2024 01/01/20 0537  BP: (!) 97/50 (!) 106/39 (!) 101/55 (!) 111/52  Pulse: 66 67 85 89  Resp: 17   18  Temp: 97.9 F (36.6 C) 97.7 F (36.5 C) 97.8 F (36.6 C) 98.5 F (36.9 C)  TempSrc: Oral Oral Oral Oral  SpO2:   98% 98%  Weight: 78.5 kg   79 kg  Height:        Intake/Output Summary (Last 24 hours) at 01/01/2020 0807 Last data filed at 12/31/2019 1250 Gross per 24 hour  Intake --  Output 753 ml  Net -753 ml   Filed Weights   12/31/19 0853 12/31/19 1250 01/01/20 0537  Weight: 79.8 kg 78.5 kg 79 kg    Examination:  General exam: No acute distress.  Elderly male lying in bed.  Poor historian. Respiratory system: Bilateral decreased breath sounds at bases with some crackles.  No wheezing  cardiovascular system: Rate controlled, S1-S2 heard Gastrointestinal system: Abdomen is nondistended, soft and nontender. Normal bowel sounds heard. Extremities: No cyanosis; bilateral lower extremity trace edema    Data Reviewed: I have personally reviewed following labs and imaging studies  CBC: Recent Labs  Lab 12/28/19 0230 12/29/19 0359 12/30/19 0535 12/31/19 0356 01/01/20 0330  WBC 11.9* 8.2 7.8 7.5 7.7  HGB 9.6* 8.4* 9.1* 8.6* 8.7*  HCT 29.5* 26.0* 27.5* 27.3* 27.1*  MCV 101.4* 103.2* 104.2* 105.4* 105.4*  PLT 233 160 157 153 638*   Basic Metabolic Panel: Recent Labs  Lab 12/26/19 0846 12/27/19 0439 12/28/19 0230 12/29/19 0359 12/31/19 0356  NA 133* 133* 137  --  136  K 5.6* 4.9 4.7  --  4.7  CL 90* 93* 97*  --  97*  CO2 23 26 27   --  26  GLUCOSE 108* 131* 89  --  99  BUN 140* 84* 44*  --  58*  CREATININE 11.49* 8.03* 5.35*  --  8.39*  CALCIUM 8.8* 8.7* 8.8*  --  8.3*  PHOS  --   --   --  4.2 3.8   GFR: Estimated Creatinine Clearance: 8.2 mL/min (A) (by C-G formula based on SCr of 8.39 mg/dL (H)). Liver Function Tests: Recent Labs  Lab 12/31/19 0356  ALBUMIN 2.3*   No  results for input(s): LIPASE, AMYLASE in the last 168 hours. No results for input(s): AMMONIA in the last 168 hours. Coagulation Profile: Recent Labs  Lab 12/28/19 0230 12/29/19 0359 12/30/19 0535 12/31/19 0356 01/01/20 0330  INR 1.6* 1.6* 2.0* 2.5* 2.5*   Cardiac Enzymes: No results for input(s): CKTOTAL, CKMB, CKMBINDEX, TROPONINI in the last 168 hours. BNP (last 3 results) No results for input(s): PROBNP in the last 8760 hours. HbA1C: No results for input(s): HGBA1C in the last 72 hours. CBG: Recent Labs  Lab 12/31/19 0658 12/31/19 0758 12/31/19 1652 12/31/19 2108 01/01/20 0745  GLUCAP 112* 90 156* 133* 89   Lipid Profile: No results for input(s): CHOL, HDL, LDLCALC, TRIG, CHOLHDL, LDLDIRECT in the last 72 hours. Thyroid Function Tests: No results for input(s): TSH, T4TOTAL, FREET4, T3FREE, THYROIDAB in the last 72 hours. Anemia Panel: No results for input(s): VITAMINB12, FOLATE, FERRITIN, TIBC, IRON, RETICCTPCT in the last 72 hours. Sepsis Labs: No results for input(s): PROCALCITON, LATICACIDVEN in the last 168 hours.  No results found for this or any previous visit (from the past 240 hour(s)).       Radiology Studies: No results found.      Scheduled Meds: . allopurinol  100 mg Oral Once per day on Mon Wed Fri  . amiodarone  200 mg Oral Daily  .  vitamin C  500 mg Oral Daily  . bictegravir-emtricitabine-tenofovir AF  1 tablet Oral Daily  . brimonidine  1 drop Left Eye BID  . calcitRIOL  0.5 mcg Oral Q M,W,F-HD  . Chlorhexidine Gluconate Cloth  6 each Topical Q0600  . Chlorhexidine Gluconate Cloth  6 each Topical Q0600  . darbepoetin (ARANESP) injection - DIALYSIS  200 mcg Intravenous Q Fri-HD  . dorzolamide-timolol  1 drop Left Eye BID  . famotidine  10 mg Oral Daily  . feeding supplement (ENSURE ENLIVE)  237 mL Oral TID BM  . heparin  2,000 Units Dialysis Once in dialysis  . insulin aspart  0-9 Units Subcutaneous TID WC  . latanoprost  1 drop Left  Eye QHS  . midodrine  10 mg Oral Q M,W,F-HD  . montelukast  10 mg Oral QHS  . multivitamin  1 tablet Oral QHS  . psyllium  1 packet Oral Daily  . sevelamer carbonate  2,400 mg Oral TID WC  . Warfarin - Pharmacist Dosing Inpatient   Does not apply q1800  . zinc sulfate  220 mg Oral Daily   Continuous Infusions:        Aline August, MD [facility] 01/01/2020, 8:07 AM

## 2020-01-01 NOTE — TOC Progression Note (Signed)
Transition of Care [facility]) - Progression Note    Patient Details  Name: David [name] MRN: [id number] Date of Birth: Jul 23, 1941  [facility] CM/SW [place], LCSW Phone Number: 01/01/2020, 11:27 AM  Clinical Narrative:    CSW spoke with patient's spouse and provided an update. She had questions regarding insurance for SNF, which CSW answered. CSW expanded SNF bed search since he is out of his 21 day isolation window. Barrier is still dialysis transportation.    Expected Discharge Plan: [place] Barriers to Discharge: Continued Medical Work up  Expected Discharge Plan and Services Expected Discharge Plan: [place] Choice: [place] arrangements for the past 2 months: Single Family Home                                       Social Determinants of Health (SDOH) Interventions    Readmission Risk Interventions Readmission Risk Prevention Plan 09/02/2019  Transportation Screening Complete  Medication Review (RN Care Manager) Complete  Palliative Care Screening Not Applicable  Some recent data might be hidden

## 2020-01-01 NOTE — Progress Notes (Signed)
Physical Therapy Treatment Patient Details Name: David [name] MRN: [id number] DOB: 1941/08/23 Today's Date: 01/01/2020    History of Present Illness Patient is a 79 year old male with history of combined systolic and diastolic heart failure (EF 20 to 25% 03/04/2019), iron deficiency anemia, atrial fibrillation on Coumadin only tolerates amiodarone, PEA arrest 02/2018, ESRD on HD MTTS, GERD, HIV, prostate cancer, CVA, hypertension who presented to the ED on 1/7 with complaints of generalized weakness and tremors and not feeling well x5 days.  Found to be Covid +.    PT Comments    Pt in bed on entry, agreeable to working with therapy. Encourage pt on increased independence with mobility today as he had HD yesterday and is well rested. Pt continues to be limited by decreased strength and endurance. Pt  Min guard for bed mobility, requiring increased time and cuing. Pt minAx2 for power up from both low bed and BSC and higher Stedy pads numerous times while brushing teeth and tolileting. In final standing bout before sitting in recliner pt able to weightshift in steady enough to bring foot off floor, 2x on each side. Pt very appreciative of therapy today and pleased with his progress. D/c plans remain appropriate. PT will continue to follow acutely.    Follow Up Recommendations  SNF;Supervision/Assistance - 24 hour     Equipment Recommendations  Other (comment)(TBD)       Precautions / Restrictions Restrictions Weight Bearing Restrictions: No    Mobility  Bed Mobility Overal bed mobility: Needs Assistance Bed Mobility: Supine to Sit     Supine to sit: Min guard;+2 for safety/equipment;HOB elevated     General bed mobility comments: Min Guard A for safety. Pt requiring significant time to bering BLEs towards EOB and then use bedrail to pull into upright posture. HOB elevated.  Transfers Overall transfer level: Needs assistance Equipment used: Ambulation equipment  used Transfers: Sit to/from Stand Sit to Stand: Min assist;+2 physical assistance;+2 safety/equipment         General transfer comment: sit<>stand from lower bed surface and BSC, and numerous times from Stedy pads for brushing teeth     Balance Overall balance assessment: Needs assistance Sitting-balance support: No upper extremity supported;Feet supported Sitting balance-Leahy Scale: Poor     Standing balance support: Bilateral upper extremity supported;During functional activity Standing balance-Leahy Scale: Poor                              Cognition Arousal/Alertness: Awake/alert Behavior During Therapy: WFL for tasks assessed/performed Overall Cognitive Status: Impaired/Different from baseline Area of Impairment: Following commands;Problem solving;Awareness;Attention                   Current Attention Level: Alternating   Following Commands: Follows multi-step commands with increased time;Follows one step commands with increased time   Awareness: Emergent Problem Solving: Slow processing;Decreased initiation;Difficulty sequencing;Requires verbal cues;Requires tactile cues        Exercises      General Comments General comments (skin integrity, edema, etc.): VSS on RA      Pertinent Vitals/Pain Pain Assessment: Faces Faces Pain Scale: Hurts a little bit Pain Location: generalized with movement Pain Descriptors / Indicators: Moaning;Grimacing Pain Intervention(s): Monitored during session;Repositioned           PT Goals (current goals can now be found in the care plan section) Acute Rehab PT Goals Patient Stated Goal: to feel better  PT Goal Formulation: With patient Time  For Goal Achievement: 12/26/19 Potential to Achieve Goals: Good    Frequency    Min 3X/week      PT Plan Current plan remains appropriate    Co-evaluation PT/OT/SLP Co-Evaluation/Treatment: Yes Reason for Co-Treatment: For patient/therapist safety PT  goals addressed during session: Mobility/safety with mobility        AM-PAC PT "6 Clicks" Mobility   Outcome Measure  Help needed turning from your back to your side while in a flat bed without using bedrails?: A Lot Help needed moving from lying on your back to sitting on the side of a flat bed without using bedrails?: Total Help needed moving to and from a bed to a chair (including a wheelchair)?: Total Help needed standing up from a chair using your arms (e.g., wheelchair or bedside chair)?: Total Help needed to walk in hospital room?: Total Help needed climbing 3-5 steps with a railing? : Total 6 Click Score: 7    End of Session Equipment Utilized During Treatment: Gait belt Activity Tolerance: Patient limited by fatigue;Patient limited by lethargy Patient left: in chair;with call bell/phone within reach;with chair alarm set Nurse Communication: Mobility status PT Visit Diagnosis: Other abnormalities of gait and mobility (R26.89);Muscle weakness (generalized) (M62.81);Other symptoms and signs involving the nervous system (V88.677)     Time: 3736-6815 PT Time Calculation (min) (ACUTE ONLY): 62 min  Charges:  $Therapeutic Activity: 23-37 mins                     Alyxandria Wentz B. Migdalia Dk PT, DPT Acute Rehabilitation Services Pager [phone] Office [phone]    [place] 01/01/2020, 1:46 PM

## 2020-01-01 NOTE — Plan of Care (Signed)
  Problem: Clinical Measurements: Goal: Ability to maintain clinical measurements within normal limits will improve Outcome: Progressing Goal: Respiratory complications will improve Outcome: Progressing   Problem: Nutrition: Goal: Adequate nutrition will be maintained Outcome: Progressing   Problem: Coping: Goal: Level of anxiety will decrease Outcome: Progressing   

## 2020-01-01 NOTE — Progress Notes (Signed)
Occupational Therapy Treatment Patient Details Name: David [name] MRN: [id number] DOB: 1941/06/15 Today's Date: 01/01/2020    History of present illness Patient is a 79 year old male with history of combined systolic and diastolic heart failure (EF 20 to 25% 03/04/2019), iron deficiency anemia, atrial fibrillation on Coumadin only tolerates amiodarone, PEA arrest 02/2018, ESRD on HD MTTS, GERD, HIV, prostate cancer, CVA, hypertension who presented to the ED on 1/7 with complaints of generalized weakness and tremors and not feeling well x5 days.  Found to be Covid +.   OT comments  Pt progressing towards established OT goals. Continues to present with decreased strength, balance, cognition, and activity tolerance. Pt performing grooming at sink while seated on stedy; Min Guard A for safety and Min A for sit<>Stand while spitting during oral care. Pt requiring Mod A +2 for transfer to [facility] and then Mod A for peri care; educating pt on lateral leaning with peri care. Continue to recommend dc to SNF and will continue to follow acutely as admitted.     Follow Up Recommendations  SNF;Supervision/Assistance - 24 hour    Equipment Recommendations  None recommended by OT    Recommendations for Other Services      Precautions / Restrictions Precautions Precautions: Fall Restrictions Weight Bearing Restrictions: No       Mobility Bed Mobility Overal bed mobility: Needs Assistance Bed Mobility: Supine to Sit     Supine to sit: Min guard;+2 for safety/equipment;HOB elevated     General bed mobility comments: Min Guard A for safety. Pt requiiring significant time to bering BLEs towards EOB and then use bedraisl to pull into upright posture. HOB elevated.  Transfers Overall transfer level: Needs assistance Equipment used: Ambulation equipment used Transfers: Sit to/from Stand Sit to Stand: Min assist;+2 physical assistance;+2 safety/equipment;Mod assist         General transfer  comment: sit<>stand from lower bed surface and BSC, and numerous times from [facility] pads for brushing teeth. Requiring Mod A +2 to power up from [facility]    Balance Overall balance assessment: Needs assistance Sitting-balance support: No upper extremity supported;Feet supported Sitting balance-Leahy Scale: Poor     Standing balance support: Bilateral upper extremity supported;During functional activity Standing balance-Leahy Scale: Poor                             ADL either performed or assessed with clinical judgement   ADL Overall ADL's : Needs assistance/impaired     Grooming: Oral care;Wash/dry hands;Sitting;Standing;Min guard;Wash/dry face Grooming Details (indicate cue type and reason): Pt performing oral care while seated at stedy. Pt standing with stedy to spit into sink. Min guard A for safety. Pt reaching fior grooming items crossing midline. Demonstrating increased FM skills to manage tooth paste without  assistance               Lower Body Dressing Details (indicate cue type and reason): Pt adjusting his socks while sitting at EOB.  Toilet Transfer: Moderate assistance;+2 for physical assistance;+2 for safety/equipment;BSC(sit<>stand with stedy) Toilet Transfer Details (indicate cue type and reason): Mod A +2 to power up into standing with stedy at [place] and Hygiene: Moderate assistance;Sitting/lateral lean;Sit to/from stand Toileting - Clothing Manipulation Details (indicate cue type and reason): Educating pt on lateral lean technique for per [employer]. Pt requiring Max cues for probelm solving to laterally lean. Pt requiring assistance to make sure he is clean while in standing  Functional mobility during ADLs: Minimal assistance;Moderate assistance;+2 for physical assistance(stedy) General ADL Comments: Pt continues to present with decreased activity [place], but he is very motviated to particiapte in therapy     Vision        Perception     Praxis      Cognition Arousal/Alertness: Awake/alert Behavior During Therapy: WFL for tasks assessed/performed Overall Cognitive Status: Impaired/Different from baseline Area of Impairment: Following commands;Problem solving;Awareness;Attention                   Current Attention Level: Alternating   Following Commands: Follows multi-step commands with increased time;Follows one step commands with increased time   Awareness: Emergent Problem Solving: Slow processing;Decreased initiation;Difficulty sequencing;Requires verbal cues;Requires tactile cues General Comments: Pt requiring increased time and cues. Noting pt joking this session. COntinues to present with poor awareness and problem solving        Exercises     Shoulder Instructions       General Comments VSS on RA    Pertinent Vitals/ Pain       Pain Assessment: Faces Faces Pain Scale: Hurts a little bit Pain Location: generalized with movement Pain Descriptors / Indicators: Moaning;Grimacing Pain Intervention(s): Monitored during session;Limited activity within patient's tolerance;Repositioned  Home Living                                          Prior Functioning/Environment              Frequency  Min 2X/week        Progress Toward Goals  OT Goals(current goals can now be found in the care plan section)  Progress towards OT goals: Progressing toward goals  Acute Rehab OT Goals Patient Stated Goal: to feel better  OT Goal Formulation: With patient Time For Goal Achievement: 01/12/20 Potential to Achieve Goals: Good ADL Goals Pt Will Perform Grooming: with min assist;sitting Pt Will Perform Upper Body Bathing: with min assist;sitting Pt Will Perform Lower Body Bathing: with mod assist;sit to/from stand Pt Will Transfer to Toilet: with mod assist;stand pivot transfer;bedside commode Pt Will Perform Toileting - Clothing Manipulation and hygiene: with mod  assist;sit to/from stand  Plan Discharge plan remains appropriate    Co-evaluation    PT/OT/SLP Co-Evaluation/Treatment: Yes Reason for Co-Treatment: For patient/therapist safety;To address functional/ADL transfers PT goals addressed during session: Mobility/safety with mobility OT goals addressed during session: ADL's and self-care      AM-PAC OT "6 Clicks" Daily Activity     Outcome Measure   Help from another person eating meals?: A Little Help from another person taking care of personal grooming?: A Little Help from another person toileting, which includes using toliet, bedpan, or urinal?: A Lot Help from another person bathing (including washing, rinsing, drying)?: A Lot Help from another person to put on and taking off regular upper body clothing?: A Little Help from another person to put on and taking off regular lower body clothing?: A Lot 6 Click Score: 15    End of Session Equipment Utilized During Treatment: Gait belt;Other (comment)(Stedy)  OT Visit Diagnosis: Unsteadiness on feet (R26.81);Cognitive communication deficit (R41.841);Muscle weakness (generalized) (M62.81)   Activity Tolerance Patient tolerated treatment well   Patient Left in chair;with call bell/phone within reach;with chair alarm set   Nurse Communication Mobility status;Need for lift equipment        Time: 6834-1962 OT Time Calculation (  min): 62 min  Charges: OT General Charges $OT Visit: 1 Visit OT Treatments $Self Care/Home Management : 23-37 mins  [place], OTR/L Acute Rehab Pager: [phone] Office: [place] 01/01/2020, 4:58 PM

## 2020-01-01 NOTE — NC FL2 (Addendum)
[place] LEVEL OF CARE SCREENING TOOL     IDENTIFICATION  Patient Name: David [name] Birthdate: 1941-03-22 Sex: male Admission Date (Current Location): 12/11/2019  [facility] and [place] Number:  Herbalist and Address:  [facility] [place], [place] [address], [place], [place] [address]      Provider Number: [id number]  Attending Physician Name and Address:  Aline August, MD  Relative Name and Phone Number:  [name] (spouse) [phone]    Current Level of Care: Hospital Recommended Level of Care: [place] Prior Approval Number:    Date Approved/Denied:   PASRR Number: [id number]  Discharge Plan: SNF    Current Diagnoses: Patient Active Problem List   Diagnosis Date Noted  . DNR (do not resuscitate)   . Combined systolic and diastolic heart failure ([place])   . ESRD on dialysis ([place])   . COVID-19 12/11/2019  . Acute respiratory failure with hypoxemia ([place]) 11/01/2019  . Hyperkalemia 09/02/2019  . Unspecified open wound of right upper arm, initial encounter 06/21/2019  . Shortness of breath 04/12/2019  . 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 Dependence on renal dialysis ([place]) 03/27/2019  . Hypertensive heart and chronic kidney disease with heart failure and with stage 5 chronic kidney disease, or end stage renal disease ([place]) 03/27/2019  . Mesenteric ischemia ([place])   . Colonic ulcer   . Right lower quadrant pain   . Abnormal CT of the abdomen   . Ischemic colitis ([place])   . Colitis presumed infectious 03/09/2019  . Acute respiratory failure with hypoxia and hypercapnia ([place]) 03/04/2019  . Pruritus, unspecified 02/21/2019  . Acute pulmonary edema ([place]) 02/11/2019  . Pain, unspecified 01/21/2019  . Atrial fibrillation with RVR ([place]) 12/05/2018  . Paroxysmal atrial fibrillation ([place]) 11/16/2018  . LBBB (left bundle branch block) 11/16/2018  . Encounter for monitoring amiodarone therapy 11/16/2018  . Volume  overload 11/01/2018  . Pulmonary edema 10/21/2018  . Gout 10/21/2018  . Hypertensive emergency 10/21/2018  . Chronic combined systolic and diastolic heart failure, NYHA class 3 ([place]) 10/21/2018  . Leukocytosis 10/21/2018  . Chills (without fever) 09/24/2018  . Gastroesophageal reflux disease 08/28/2018  . Chronic anticoagulation 04/03/2018  . CAD (coronary artery disease) 04/03/2018  . Palliative care encounter 03/27/2018  . Atrial fibrillation, chronic ([place]) 03/25/2018  . Anemia in chronic kidney disease 03/12/2018  . Coagulation defect, unspecified ([place]) 03/12/2018  . Secondary hyperparathyroidism of renal origin ([place]) 03/12/2018  . Palliative care by specialist   . Advance care planning   . Goals of care, counseling/discussion   . Respiratory arrest ([place])   . Acute on chronic systolic heart failure ([place])   . Chronic obstructive pulmonary disease ([place])   . HIV (human immunodeficiency virus infection) ([place])   . ESRD (end stage renal disease) ([place])   . Acute respiratory failure with hypoxia ([place]) 01/21/2018  . Pneumonia   . Aspiration pneumonia of both lower lobes due to regurgitated food ([place])   . Sepsis due to pneumonia ([place]) 11/22/2017  . AIDS ([place]) 05/14/2014  . 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 Gastroparesis 05/06/2014  . Protein-calorie malnutrition, severe ([place]) 05/05/2014  . ESRD on hemodialysis ([place]) 05/02/2014  . Hemodialysis-associated hypotension 05/02/2014  . Leaking of conjunctival drainage bleb 05/01/2014  . POAG (primary open-angle glaucoma) 05/01/2014  . Loss of weight 04/29/2014  . Conjunctivitis 04/29/2014  . 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 Low back pain 04/09/2012  . Osteoarthritis of hip 12/29/2011  . Iron deficiency  anemia 06/28/2011  . Macrocytic anemia 04/02/2011  . 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 Hyperlipidemia 11/30/2010  . Transient cerebral ischemia 11/30/2010  . HYPERGLYCEMIA 11/30/2010     Orientation RESPIRATION BLADDER Height & Weight     Self, Time, Situation, Place  Normal Continent Weight: 174 lb 2.6 oz (79 kg) Height:  6\' 2"  (188 cm)  BEHAVIORAL SYMPTOMS/MOOD NEUROLOGICAL BOWEL NUTRITION STATUS      Continent Diet(see discharge summary)  AMBULATORY STATUS COMMUNICATION OF NEEDS Skin   Extensive Assist Verbally Other (Comment)(right thigh closed surgical incision)                       Personal Care Assistance Level of Assistance  Bathing, Feeding, Dressing, Total care Bathing Assistance: Maximum assistance Feeding assistance: Limited assistance Dressing Assistance: Maximum assistance Total Care Assistance: Maximum assistance   Functional Limitations Info  Sight, Hearing, Speech Sight Info: Adequate Hearing Info: Adequate Speech Info: Adequate    SPECIAL CARE FACTORS FREQUENCY  PT (By licensed PT), OT (By licensed OT)     PT Frequency: min 5x weekly OT Frequency: min 5x weekly            Contractures Contractures Info: Not present    Additional Factors Info  Code Status, Allergies, Isolation Precautions Code Status Info: Full Allergies Info: Dextromethorphanguaifenesin, tocotrienol, losartan potassium     Isolation Precautions Info: Now off of COVID isolation precautions.     Current Medications (01/01/2020):  This is the current hospital active medication list Current Facility-Administered Medications  Medication Dose Route Frequency Provider Last Rate Last Admin  . acetaminophen (TYLENOL) tablet 650 mg  650 mg Oral Q6H PRN [name], MD   650 mg at 01/01/20 0959  . albuterol (VENTOLIN HFA) 108 (90 Base) MCG/ACT inhaler 1-2 puff  1-2 puff Inhalation Q4H PRN Raiford Noble [place], DO   2 puff at 12/18/19 2103  . allopurinol (ZYLOPRIM) tablet 100 mg  100 mg Oral Once per day on Mon Wed Fri [name], MD   100 mg at 12/31/19 1805  . amiodarone (PACERONE) tablet 200 mg  200 mg Oral Daily [name], MD   200 mg at 01/01/20  0840  . ascorbic acid (VITAMIN C) tablet 500 mg  500 mg Oral Daily [name], MD   500 mg at 01/01/20 0840  . bictegravir-emtricitabine-tenofovir AF ([name]) 50-200-25 MG per tablet 1 tablet  1 tablet Oral Daily Raiford Noble [place], DO   1 tablet at 12/31/19 2047  . brimonidine (ALPHAGAN) 0.15 % ophthalmic solution 1 drop  1 drop Left Eye BID [name], MD   1 drop at 01/01/20 0840  . calcitRIOL (ROCALTROL) capsule 0.5 mcg  0.5 mcg Oral Q M,W,F-HD Raiford Noble [place], DO   0.5 mcg at 12/29/19 1459  . Chlorhexidine Gluconate Cloth 2 % PADS 6 each  6 each Topical Q0600 Roney Jaffe, MD   6 each at 01/01/20 0535  . Chlorhexidine Gluconate Cloth 2 % PADS 6 each  6 each Topical Q0600 Roney Jaffe, MD   6 each at 01/01/20 0534  . chlorpheniramine-HYDROcodone (TUSSIONEX) 10-8 MG/5ML suspension 5 mL  5 mL Oral Q12H PRN [name], MD      . Darbepoetin Alfa (ARANESP) injection 200 mcg  200 mcg Intravenous Q Fri-HD Dwana Melena, MD   200 mcg at 12/26/19 1002  . dorzolamide-timolol (COSOPT) 22.3-6.8 MG/ML ophthalmic solution 1 drop  1 drop Left Eye BID Neysa Bonito,  Chauncy Passy, MD   1 drop at 01/01/20 0840  . famotidine (PEPCID) tablet 10 mg  10 mg Oral Daily [name], MD   10 mg at 01/01/20 0840  . feeding supplement (ENSURE ENLIVE) (ENSURE ENLIVE) liquid 237 mL  237 mL Oral TID BM Sheikh, Omair [place], DO   237 mL at 12/31/19 2046  . heparin injection 2,000 Units  2,000 Units Dialysis Once in dialysis Roney Jaffe, MD      . insulin aspart (novoLOG) injection 0-9 Units  0-9 Units Subcutaneous TID WC Neila Gear, NP   2 Units at 12/31/19 1806  . Ipratropium-Albuterol (COMBIVENT) respimat 1 puff  1 puff Inhalation Q6H PRN Sheikh, Omair Latif, DO      . latanoprost (XALATAN) 0.005 % ophthalmic solution 1 drop  1 drop Left Eye QHS [name], MD   1 drop at 12/31/19 2244  . midodrine (PROAMATINE) tablet 10 mg  10 mg Oral Q M,W,F-HD Roney Jaffe, MD   10 mg at 12/31/19 0744  .  montelukast (SINGULAIR) tablet 10 mg  10 mg Oral QHS [name], MD   10 mg at 12/31/19 2237  . multivitamin (RENA-VIT) tablet 1 tablet  1 tablet Oral QHS Raiford Noble [place], [place]   1 tablet at 12/31/19 2237  . ondansetron (ZOFRAN) tablet 4 mg  4 mg Oral Q6H PRN [name], MD       Or  . ondansetron [facility]) injection 4 mg  4 mg Intravenous Q6H PRN [name], MD      . psyllium (HYDROCIL/METAMUCIL) packet 1 packet  1 packet Oral Daily Dwana Melena, MD   1 packet at 01/01/20 [phone]  . senna-docusate (Senokot-S) tablet 1 tablet  1 tablet Oral QHS PRN [name], MD      . sevelamer carbonate (RENVELA) tablet 2,400 mg  2,400 mg Oral TID WC Dwana Melena, MD   2,400 mg at 01/01/20 0841  . traMADol (ULTRAM) tablet 50 mg  50 mg Oral Q6H PRN [name], MD   50 mg at 12/18/19 2109  . warfarin (COUMADIN) tablet 5 mg  5 mg Oral q1800 Karren Cobble, [place]      . Warfarin - Pharmacist Dosing Inpatient   Does not apply q1800 Para March, [facility]   Given at 12/22/19 1734  . zinc sulfate capsule 220 mg  220 mg Oral Daily [name], MD   220 mg at 01/01/20 0840     Discharge Medications: Please see discharge summary for a list of discharge medications.  Relevant Imaging Results:  Relevant Lab Results:   Additional Information SSN: [id number]    Dialysis patient. Can adjust his center location if needed.   Benard Halsted, LCSW

## 2020-01-01 NOTE — Progress Notes (Signed)
ANTICOAGULATION CONSULT NOTE - Follow Up Consult  Pharmacy Consult for Coumadin Indication: atrial fibrillation  Allergies  Allergen Reactions  . Dextromethorphan-Guaifenesin     UNKNOWN   . Tocotrienols     UNKNOWN   . Losartan Potassium Other (See Comments)    Causes constipation    Patient Measurements: Height: 6\' 2"  (188 cm) Weight: 174 lb 2.6 oz (79 kg) IBW/kg (Calculated) : 82.2  Vital Signs: Temp: 98.5 F (36.9 C) (01/28 0537) Temp Source: Oral (01/28 0537) BP: 111/52 (01/28 0537) Pulse Rate: 89 (01/28 0537)  Labs: Recent Labs    12/30/19 0535 12/30/19 0535 12/31/19 0356 01/01/20 0330  HGB 9.1*   < > 8.6* 8.7*  HCT 27.5*  --  27.3* 27.1*  PLT 157  --  153 149*  LABPROT 22.4*  --  26.5* 26.6*  INR 2.0*  --  2.5* 2.5*  CREATININE  --   --  8.39*  --    < > = values in this interval not displayed.    Estimated Creatinine Clearance: 8.2 mL/min (A) (by C-G formula based on SCr of 8.39 mg/dL (H)).  Assessment:  Anticoag: Warfarin for hx afib (CHADS2VASc = 8)- d-dimer 5.26 (last 1/23). INR 1.6>2>2.5 again today. Hgb 8.7 stable overnight. Plts down to 149. - Warfarin PTA 2.5mg  MF, 5mg  AOD (INR 1.2 on admit) - on amiodarone also PTA.  1/15: (clot >> bleed risk). rapid response was called on patient for excessive bleeding around his HD site. 2 units PRBC. 1/16: INR 4.3; poor appetite, had bleeding episode in HD on 1/15 and received 2 unit of pRBC  1/23: INR 2.1. Hgb 7.1. Plt 231. 1u PRBC. No bleeding noted.    Goal of Therapy:  INR 2-3 Monitor platelets by anticoagulation protocol: Yes   Plan:  Coumadin 5mg  daily.  Daily INR   David [name] S. Alford Highland, PharmD, BCPS Clinical Staff Pharmacist [email or web address] Alford Highland, [employer] 01/01/2020,8:38 AM

## 2020-01-02 LAB — BASIC METABOLIC PANEL
Anion gap: 14 (ref 5–15)
BUN: 71 mg/dL — ABNORMAL HIGH (ref 8–23)
CO2: 27 mmol/L (ref 22–32)
Calcium: 8.6 mg/dL — ABNORMAL LOW (ref 8.9–10.3)
Chloride: 94 mmol/L — ABNORMAL LOW (ref 98–111)
Creatinine, Ser: 9.44 mg/dL — ABNORMAL HIGH (ref 0.61–1.24)
GFR calc Af Amer: 6 mL/min — ABNORMAL LOW (ref >60)
GFR calc non Af Amer: 5 mL/min — ABNORMAL LOW (ref >60)
Glucose, Bld: 93 mg/dL (ref 70–99)
Potassium: 5.2 mmol/L — ABNORMAL HIGH (ref 3.5–5.1)
Sodium: 135 mmol/L (ref 135–145)

## 2020-01-02 LAB — CBC
HCT: 28.6 % — ABNORMAL LOW (ref 39.0–52.0)
Hemoglobin: 8.9 g/dL — ABNORMAL LOW (ref 13.0–17.0)
MCH: 33.2 pg (ref 26.0–34.0)
MCHC: 31.1 g/dL (ref 30.0–36.0)
MCV: 106.7 fL — ABNORMAL HIGH (ref 80.0–100.0)
Platelets: 143 10*3/uL — ABNORMAL LOW (ref 150–400)
RBC: 2.68 MIL/uL — ABNORMAL LOW (ref 4.22–5.81)
RDW: 19.9 % — ABNORMAL HIGH (ref 11.5–15.5)
WBC: 8.4 10*3/uL (ref 4.0–10.5)
nRBC: 0 % (ref 0.0–0.2)

## 2020-01-02 LAB — GLUCOSE, CAPILLARY
Glucose-Capillary: 88 mg/dL (ref 70–99)
Glucose-Capillary: 108 mg/dL — ABNORMAL HIGH (ref 70–99)
Glucose-Capillary: 104 mg/dL — ABNORMAL HIGH (ref 70–99)
Glucose-Capillary: 113 mg/dL — ABNORMAL HIGH (ref 70–99)

## 2020-01-02 LAB — PROTIME-INR
INR: 2.8 — ABNORMAL HIGH (ref 0.8–1.2)
Prothrombin Time: 29.8 seconds — ABNORMAL HIGH (ref 11.4–15.2)

## 2020-01-02 MED ORDER — CHLORHEXIDINE GLUCONATE CLOTH 2 % EX PADS
6.0000 | MEDICATED_PAD | Freq: Every day | CUTANEOUS | Status: DC
  Administered 2020-01-04 – 2020-01-09 (×6): 6 via TOPICAL

## 2020-01-02 MED ORDER — HEPARIN SODIUM (PORCINE) 1000 UNIT/ML DIALYSIS
2000.0000 [IU] | Freq: Once | INTRAMUSCULAR | Status: AC
  Administered 2020-01-07: 3400 [IU] via INTRAVENOUS_CENTRAL
  Filled 2020-01-02: qty 2

## 2020-01-02 NOTE — Consult Note (Signed)
   [facility] CM Inpatient Consult   01/02/2020  [facility] [place] 03/05/1941 [id number]   Follow up:  Disposition LLOS  Patient's chart reviewed for disposition needs and patient continues to have recommendations for a skilled nursing facility.  Plan:  Ongoing follow up til disposition and transition.  Natividad Brood, RN BSN [facility] Liaison  [phone] business mobile phone Toll free office [phone]  Fax number: [phone] [email or web address]

## 2020-01-02 NOTE — Progress Notes (Signed)
ANTICOAGULATION CONSULT NOTE - Follow Up Consult  Pharmacy Consult for Coumadin Indication: atrial fibrillation  Allergies  Allergen Reactions  . Dextromethorphan-Guaifenesin     UNKNOWN   . Tocotrienols     UNKNOWN   . Losartan Potassium Other (See Comments)    Causes constipation    Patient Measurements: Height: 6\' 2"  (188 cm) Weight: 173 lb 11.6 oz (78.8 kg) IBW/kg (Calculated) : 82.2  Vital Signs: Temp: 98.3 F (36.8 C) (01/29 0405) Temp Source: Oral (01/29 0405) BP: 130/62 (01/29 0405) Pulse Rate: 74 (01/29 0405)  Labs: Recent Labs    12/31/19 0356 12/31/19 0356 01/01/20 0330 01/02/20 0847  HGB 8.6*   < > 8.7* 8.9*  HCT 27.3*  --  27.1* 28.6*  PLT 153  --  149* 143*  LABPROT 26.5*  --  26.6* 29.8*  INR 2.5*  --  2.5* 2.8*  CREATININE 8.39*  --   --  9.44*   < > = values in this interval not displayed.    Estimated Creatinine Clearance: 7.3 mL/min (A) (by C-G formula based on SCr of 9.44 mg/dL (H)).  Assessment:  Anticoag: Warfarin for hx afib (CHADS2VASc = 8)- d-dimer 5.26 (last 1/23).   INR 2.8 today  Warfarin PTA 2.5mg  MF, 5mg  AOD (INR 1.2 on admit) - on amiodarone also PTA.  Goal of Therapy:  INR 2-3 Monitor platelets by anticoagulation protocol: Yes   Plan:  Continue Coumadin 5mg  daily.  Daily INR  Thank you Anette Guarneri, PharmD [email or web address] 01/02/2020,10:08 AM

## 2020-01-02 NOTE — Progress Notes (Signed)
Patient ID: David [name], male   DOB: [birth year], 79 y.o.   MRN: [id number]  PROGRESS NOTE    [facility]  WUJ:[id number] DOB: [birth year] DOA: 12/11/2019 PCP: [name], NP   Brief Narrative:  79 year old male with past medical history of end-stage renal disease on hemodialysis, 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 During the hospitalization, patient was not started on any treatment for Covid as he was not hypoxic however, subsequently his respiratory rate increased and CRP was 22.9 and was subsequently started on dexamethasone and remdesivir on 12/12/2019 He was found to have elevated troponin: Cardiology was consulted.  Cardiology deemed the patient not a candidate for cardiac intervention and troponin elevation was secondary to demand ischemia and no therapies recommended. Nephrology was consulted as well. Palliative Care was consulted and subsequently patient was made a DNR. Hospital course complicated by ongoing failure to thrive, poor p.o. intake, palliative care conversations ongoing; finally with improving oral intake and now plan for SNF placement with palliative care follow-up. Remained stable awaiting for rehabilitation.  Completed Covid isolation on 01/01/2020.  Assessment & Plan:   COVID-19 virus infection -Tested positive on 12/11/2019 -Has completed 5 days of remdesivir, 4 days of dexamethasone which was stopped on 12/15/2019 due to uremia (BUN to 99) -Respiratory status has improved and stable.  Currently on room air -Remains debilitated and frail.  PT evaluation recommended SNF placement.  Social  worker following. -Completed Covid isolation on 01/01/2020  Nausea and vomiting -Most likely from Covid infection and uremia.  Resolved  Elevated troponin in a patient with extensive cardiac history End-stage heart failure, systolic and diastolic combined CHF History of PEA arrest -EF of 20% -Troponin mildly elevated during admission but did not trend up.  EKG unchanged with baseline LBBB -Cardiology consulted-no indication for cardiac intervention due to end-stage heart failure and recommending optimizing volume status with hemodialysis -Palliative care consulted, as above goals of care conversation had and he is now DNR -Poor prognosis  End-stage renal disease on hemodialysis Hyperkalemia: Resolved -Nephrology following.  Dialysis as per nephrology schedule.  Continue midodrine on dialysis days.  Chronic hypotension -Continue midodrine on dialysis days  Anemia of chronic disease/microcytic anemia -Hemoglobin stable.  Monitor intermittently  Paroxysmal A. fib  -on Coumadin and amiodarone.  Currently rate controlled.  INR therapeutic  Chronic pain -Continue with tramadol  History of HIV -Stable.  Continue [name].  Last CD4 count was 413 in 09/2019 with undetectable viral load  Prediabetes -Continue SSI  Ambulatory dysfunction Generalized deconditioning -PT/OT recommending SNF.  Social worker following.  Palliative care has evaluated the patient and patient is currently DNR.  Palliative care recommending palliative care follow-up as an outpatient.  DVT prophylaxis: Coumadin Code Status: DNR Family Communication: None at bedside Disposition Plan: SNF once bed is available.  Currently medically stable for discharge.  Consultants: Cardiology/nephrology/palliative care  Procedures: None  Antimicrobials: Has already completed remdesivir treatment   Subjective: Patient seen and examined at bedside.  Poor historian.  No worsening shortness of breath, fever or vomiting  reported.   Objective: Vitals:   01/01/20 0537 01/01/20 0800 01/01/20 2034 01/02/20 0405  BP: (!) 111/52 (!) 116/57 108/61 130/62  Pulse: 89 76  81 74  Resp: 18  18 18   Temp: 98.5 F (36.9 C) 97.9 F (36.6 C) 98.5 F (36.9 C) 98.3 F (36.8 C)  TempSrc: Oral Oral Oral Oral  SpO2: 98%  100% 100%  Weight: 79 kg   78.8 kg  Height:        Intake/Output Summary (Last 24 hours) at 01/02/2020 0727 Last data filed at 01/01/2020 1300 Gross per 24 hour  Intake 480 ml  Output --  Net 480 ml   Filed Weights   12/31/19 1250 01/01/20 0537 01/02/20 0405  Weight: 78.5 kg 79 kg 78.8 kg    Examination:  General exam: No distress.  Elderly male lying in bed.  Poor historian. Respiratory system: Bilateral decreased breath sounds at bases.  No wheezing.  Some scattered crackles  cardiovascular system: S1-S2 heard, rate controlled Gastrointestinal system: Abdomen is nondistended, soft and nontender. Normal bowel sounds heard. Extremities: No cyanosis; bilateral lower extremity trace edema    Data Reviewed: I have personally reviewed following labs and imaging studies  CBC: Recent Labs  Lab 12/28/19 0230 12/29/19 0359 12/30/19 0535 12/31/19 0356 01/01/20 0330  WBC 11.9* 8.2 7.8 7.5 7.7  HGB 9.6* 8.4* 9.1* 8.6* 8.7*  HCT 29.5* 26.0* 27.5* 27.3* 27.1*  MCV 101.4* 103.2* 104.2* 105.4* 105.4*  PLT 233 160 157 153 846*   Basic Metabolic Panel: Recent Labs  Lab 12/26/19 0846 12/27/19 0439 12/28/19 0230 12/29/19 0359 12/31/19 0356  NA 133* 133* 137  --  136  K 5.6* 4.9 4.7  --  4.7  CL 90* 93* 97*  --  97*  CO2 23 26 27   --  26  GLUCOSE 108* 131* 89  --  99  BUN 140* 84* 44*  --  58*  CREATININE 11.49* 8.03* 5.35*  --  8.39*  CALCIUM 8.8* 8.7* 8.8*  --  8.3*  PHOS  --   --   --  4.2 3.8   GFR: Estimated Creatinine Clearance: 8.2 mL/min (A) (by C-G formula based on SCr of 8.39 mg/dL (H)). Liver Function Tests: Recent Labs  Lab 12/31/19 0356  ALBUMIN 2.3*   No results  for input(s): LIPASE, AMYLASE in the last 168 hours. No results for input(s): AMMONIA in the last 168 hours. Coagulation Profile: Recent Labs  Lab 12/28/19 0230 12/29/19 0359 12/30/19 0535 12/31/19 0356 01/01/20 0330  INR 1.6* 1.6* 2.0* 2.5* 2.5*   Cardiac Enzymes: No results for input(s): CKTOTAL, CKMB, CKMBINDEX, TROPONINI in the last 168 hours. BNP (last 3 results) No results for input(s): PROBNP in the last 8760 hours. HbA1C: No results for input(s): HGBA1C in the last 72 hours. CBG: Recent Labs  Lab 12/31/19 2108 01/01/20 0745 01/01/20 1155 01/01/20 1640 01/01/20 2112  GLUCAP 133* 89 143* 96 132*   Lipid Profile: No results for input(s): CHOL, HDL, LDLCALC, TRIG, CHOLHDL, LDLDIRECT in the last 72 hours. Thyroid Function Tests: No results for input(s): TSH, T4TOTAL, FREET4, T3FREE, THYROIDAB in the last 72 hours. Anemia Panel: No results for input(s): VITAMINB12, FOLATE, FERRITIN, TIBC, IRON, RETICCTPCT in the last 72 hours. Sepsis Labs: No results for input(s): PROCALCITON, LATICACIDVEN in the last 168 hours.  No results found for this or any previous visit (from the past 240 hour(s)).       Radiology Studies: No results found.      Scheduled Meds: . allopurinol  100 mg Oral Once per day on Mon Wed Fri  . amiodarone  200 mg Oral Daily  . vitamin C  500 mg Oral Daily  . bictegravir-emtricitabine-tenofovir AF  1 tablet Oral Daily  . brimonidine  1 drop Left Eye BID  . calcitRIOL  0.5 mcg Oral Q M,W,F-HD  . Chlorhexidine Gluconate Cloth  6 each Topical Q0600  . darbepoetin (ARANESP) injection - DIALYSIS  200 mcg Intravenous Q Fri-HD  . dorzolamide-timolol  1 drop Left Eye BID  . famotidine  10 mg Oral Daily  . feeding supplement (ENSURE ENLIVE)  237 mL Oral TID BM  . heparin  2,000 Units Dialysis Once in dialysis  . insulin aspart  0-9 Units Subcutaneous TID WC  . latanoprost  1 drop Left Eye QHS  . midodrine  10 mg Oral Q M,W,F-HD  . montelukast   10 mg Oral QHS  . multivitamin  1 tablet Oral QHS  . psyllium  1 packet Oral Daily  . sevelamer carbonate  2,400 mg Oral TID WC  . warfarin  5 mg Oral q1800  . Warfarin - Pharmacist Dosing Inpatient   Does not apply q1800  . zinc sulfate  220 mg Oral Daily   Continuous Infusions:        Aline August, MD [facility] 01/02/2020, 7:27 AM

## 2020-01-02 NOTE — Progress Notes (Signed)
[facility] Progress Note   OP HD: [facility] (MWF here) 4h 400/1.5 83.5kg 2/2 bath P4 R TDC/ new R thigh AVG Hep 2000 - Mircera 29mcg IV q 2 weeks (last 12/06/19) - Calcitriol 0.20mcg PO q HD  Assessment/ Plan:   1)End-stage renal disease -outpt HD is [facility], doing MWF here w/ Sat as needed - uremia/ nausea better w/ extra HD last week  - this week plan 3.5h sessions MWF and poss 2 hr Sat  - had failure of RUE AVG in aug 2020, new L thigh AVG placed 12/07 which failed also  - new R thigh AVG placed 12/02/19 we used 1/27 for HD, cont to use AVG  - HD today, no fluid off, wt's stable and down  - still awaiting SNF placement  2)Nausea/ poor po intake- resolved, eating   3)COVID-19 infection -Therapies per primary team - s/p  IV dexamethasone and  remdesivir - COVID isolation will end on 1/28 today   4)Combined chronic systolic and diastolic chronic end-stage heart failure(20% EF) - optimize volume with HD as able -Status post cardiology evaluation with little therapy to add given his hypotension  - dry wt was 83.5k, lowest wt here 78.5kg, euvolemic on exam  5)Debility - looking better, sitting up in bed today, says he is supposed to maybe get up in the chair today  6)Chronic hypotension - onmidodrine  7)Anemia secondary to CKD - On ESA-last OP esa dosed on 1/2 as above - SP prbc 1U 1/15 - dosed  aranesp 200 here (1/22)  8)Secondary hyperparathyroidism -Will continue calcitriol inpatient and will be administered at his dialysis unit upon discharge.renal diet. On velphoro->upsetting stomach (stopped) and incr renvela to 3tidm on 1/22. follow  9)H/o CVA  10)HIV - on meds  11)Atrial fibrillation - appears chronic   Kelly Splinter  MD 01/02/2020, 12:22 PM    Subjective:   Seen in room, feeling better, getting stronger   Objective:   BP 130/62 (BP Location: Left Arm)   Pulse 74   Temp 98.3 F (36.8 C) (Oral)    Resp 18   Ht 6\' 2"  (1.88 m)   Wt 78.8 kg   SpO2 100%   BMI 22.30 kg/m   Intake/Output Summary (Last 24 hours) at 01/02/2020 1222 Last data filed at 01/02/2020 0940 Gross per 24 hour  Intake 450 ml  Output --  Net 450 ml   Weight change: -1 kg   Physical Exam: GEN: NAD, A&Ox3, NCAT HEENT: No conjunctival pallor, EOMI NECK: Supple, no thyromegaly LUNGS: CTA B/L no rales, rhonchi or wheezing GE:XBMWU irreg ABD: SNDNT +BS  EXT: No lower extremity edema ACCESS: rt thigh AVG +pulse, RIJ TC     Medications:    . allopurinol  100 mg Oral Once per day on Mon Wed Fri  . amiodarone  200 mg Oral Daily  . vitamin C  500 mg Oral Daily  . bictegravir-emtricitabine-tenofovir AF  1 tablet Oral Daily  . brimonidine  1 drop Left Eye BID  . calcitRIOL  0.5 mcg Oral Q M,W,F-HD  . Chlorhexidine Gluconate Cloth  6 each Topical Q0600  . darbepoetin (ARANESP) injection - DIALYSIS  200 mcg Intravenous Q Fri-HD  . dorzolamide-timolol  1 drop Left Eye BID  . famotidine  10 mg Oral Daily  . feeding supplement (ENSURE ENLIVE)  237 mL Oral TID BM  . heparin  2,000 Units Dialysis Once in dialysis  . insulin aspart  0-9 Units Subcutaneous TID WC  . latanoprost  1 drop Left Eye QHS  . midodrine  10 mg Oral Q M,W,F-HD  . montelukast  10 mg Oral QHS  . multivitamin  1 tablet Oral QHS  . psyllium  1 packet Oral Daily  . sevelamer carbonate  2,400 mg Oral TID WC  . warfarin  5 mg Oral q1800  . Warfarin - Pharmacist Dosing Inpatient   Does not apply q1800  . zinc sulfate  220 mg Oral Daily

## 2020-01-02 NOTE — Progress Notes (Signed)
Pt to dialysis. Report given to [facility]

## 2020-01-02 NOTE — Progress Notes (Signed)
Patient no longer needs to treat at [place] isolation shift and can return to his regular schedule at home OP HD clinic[facility] upon discharge.  Alphonzo Cruise, [place] [facility] Navigator [phone]

## 2020-01-03 LAB — GLUCOSE, CAPILLARY
Glucose-Capillary: 113 mg/dL — ABNORMAL HIGH (ref 70–99)
Glucose-Capillary: 88 mg/dL (ref 70–99)
Glucose-Capillary: 89 mg/dL (ref 70–99)
Glucose-Capillary: 109 mg/dL — ABNORMAL HIGH (ref 70–99)

## 2020-01-03 LAB — BASIC METABOLIC PANEL
Anion gap: 9 (ref 5–15)
BUN: 26 mg/dL — ABNORMAL HIGH (ref 8–23)
CO2: 29 mmol/L (ref 22–32)
Calcium: 7.3 mg/dL — ABNORMAL LOW (ref 8.9–10.3)
Chloride: 95 mmol/L — ABNORMAL LOW (ref 98–111)
Creatinine, Ser: 4.89 mg/dL — ABNORMAL HIGH (ref 0.61–1.24)
GFR calc Af Amer: 12 mL/min — ABNORMAL LOW (ref >60)
GFR calc non Af Amer: 11 mL/min — ABNORMAL LOW (ref >60)
Glucose, Bld: 91 mg/dL (ref 70–99)
Potassium: 3.6 mmol/L (ref 3.5–5.1)
Sodium: 133 mmol/L — ABNORMAL LOW (ref 135–145)

## 2020-01-03 LAB — CBC
HCT: 24.8 % — ABNORMAL LOW (ref 39.0–52.0)
Hemoglobin: 8 g/dL — ABNORMAL LOW (ref 13.0–17.0)
MCH: 33.3 pg (ref 26.0–34.0)
MCHC: 32.3 g/dL (ref 30.0–36.0)
MCV: 103.3 fL — ABNORMAL HIGH (ref 80.0–100.0)
Platelets: 119 10*3/uL — ABNORMAL LOW (ref 150–400)
RBC: 2.4 MIL/uL — ABNORMAL LOW (ref 4.22–5.81)
RDW: 19.4 % — ABNORMAL HIGH (ref 11.5–15.5)
WBC: 5.2 10*3/uL (ref 4.0–10.5)
nRBC: 0 % (ref 0.0–0.2)

## 2020-01-03 LAB — HEPATITIS B CORE ANTIBODY, TOTAL: Hep B Core Total Ab: NONREACTIVE

## 2020-01-03 LAB — PROTIME-INR
INR: 3 — ABNORMAL HIGH (ref 0.8–1.2)
Prothrombin Time: 31.5 seconds — ABNORMAL HIGH (ref 11.4–15.2)

## 2020-01-03 LAB — RENAL FUNCTION PANEL
Albumin: 2.2 g/dL — ABNORMAL LOW (ref 3.5–5.0)
Anion gap: 13 (ref 5–15)
BUN: 39 mg/dL — ABNORMAL HIGH (ref 8–23)
CO2: 27 mmol/L (ref 22–32)
Calcium: 7.8 mg/dL — ABNORMAL LOW (ref 8.9–10.3)
Chloride: 96 mmol/L — ABNORMAL LOW (ref 98–111)
Creatinine, Ser: 6.83 mg/dL — ABNORMAL HIGH (ref 0.61–1.24)
GFR calc Af Amer: 8 mL/min — ABNORMAL LOW (ref >60)
GFR calc non Af Amer: 7 mL/min — ABNORMAL LOW (ref >60)
Glucose, Bld: 153 mg/dL — ABNORMAL HIGH (ref 70–99)
Phosphorus: 2.1 mg/dL — ABNORMAL LOW (ref 2.5–4.6)
Potassium: 4.5 mmol/L (ref 3.5–5.1)
Sodium: 136 mmol/L (ref 135–145)

## 2020-01-03 LAB — HEPATITIS B SURFACE ANTIBODY,QUALITATIVE: Hep B S Ab: NONREACTIVE

## 2020-01-03 MED ORDER — PRO-STAT SUGAR FREE PO LIQD
30.0000 mL | Freq: Two times a day (BID) | ORAL | Status: DC
  Administered 2020-01-03 – 2020-01-04 (×3): 30 mL via ORAL
  Filled 2020-01-03 (×6): qty 30

## 2020-01-03 MED ORDER — WARFARIN SODIUM 2.5 MG PO TABS
2.5000 mg | ORAL_TABLET | Freq: Once | ORAL | Status: AC
  Administered 2020-01-03: 2.5 mg via ORAL
  Filled 2020-01-03: qty 1

## 2020-01-03 MED ORDER — WARFARIN SODIUM 5 MG PO TABS: 5.0000 mg | ORAL_TABLET | Freq: Every day | ORAL | Status: DC

## 2020-01-03 MED ORDER — POLYETHYLENE GLYCOL 3350 17 G PO PACK: 17.0000 g | PACK | Freq: Every day | ORAL | Status: DC | PRN

## 2020-01-03 NOTE — Progress Notes (Signed)
Patient ID: David [name], male   DOB: Dec 05, 1940, 79 y.o.   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 During the hospitalization, patient was not started on any treatment for Covid as he was not hypoxic however, subsequently his respiratory rate increased and CRP was 22.9 and was subsequently started on dexamethasone and remdesivir on 12/12/2019 He was found to have elevated troponin: Cardiology was consulted.  Cardiology deemed the patient not a candidate for cardiac intervention and troponin elevation was secondary to demand ischemia and no therapies recommended. Nephrology was consulted as well. Palliative Care was consulted and subsequently patient was made a DNR. Hospital course complicated by ongoing failure to thrive, poor p.o. intake, palliative care conversations ongoing; finally with improving oral intake and now plan for SNF placement with palliative care follow-up. He is medically stable for discharge to SNF, awaiting bed.  Completed Covid isolation on 01/01/2020.  Assessment & Plan:   COVID-19 virus infection -Tested positive on 12/11/2019 -Has completed 5 days of remdesivir, 4 days of dexamethasone which was stopped on 12/15/2019 due to uremia (BUN to 99) -Respiratory status has improved and stable.  Currently on room air -Remains debilitated and frail.  PT evaluation recommended SNF placement.   Social worker following. -Completed Covid isolation on 01/01/2020  Nausea and vomiting -Most likely from Covid infection and uremia.  Resolved  Elevated troponin in a patient with extensive cardiac history End-stage heart failure, systolic and diastolic combined CHF History of PEA arrest -EF of 20% -Troponin mildly elevated during admission but did not trend up.  EKG unchanged with baseline LBBB -Cardiology consulted-no indication for cardiac intervention due to end-stage heart failure and recommending optimizing volume status with hemodialysis -Palliative care consulted, as above goals of care conversation had and he is now DNR -Poor prognosis  End-stage renal disease on hemodialysis Hyperkalemia: Resolved -Nephrology following.  Dialysis as per nephrology schedule.  Continue midodrine on dialysis days.  Chronic hypotension -Continue midodrine on dialysis days  Anemia of chronic disease/microcytic anemia -Hemoglobin stable.  Monitor intermittently  Thrombocytopenia -Questionable cause.  Monitor intermittently.  No signs of bleeding  Paroxysmal A. fib  -on Coumadin and amiodarone.  Currently rate controlled.  INR therapeutic  Chronic pain -Continue with tramadol  History of HIV -Stable.  Continue [name].  Last CD4 count was 413 in 09/2019 with undetectable viral load  Prediabetes -Continue SSI  Ambulatory dysfunction Generalized deconditioning -PT/OT recommending SNF.  Social worker following.  Palliative care has evaluated the patient and patient is currently DNR.  Palliative care recommending palliative care follow-up as an outpatient.  DVT prophylaxis: Coumadin Code Status: DNR Family Communication: None at bedside Disposition Plan: SNF once bed is available.  Currently medically stable for discharge.  Consultants: Cardiology/nephrology/palliative care  Procedures: None  Antimicrobials: Has already completed remdesivir treatment   Subjective: Patient seen  and examined at bedside.  Poor historian.  Denies fever, worsening shortness of breath.   Objective: Vitals:   01/03/20 0130 01/03/20 0202 01/03/20 0234  01/03/20 0502  BP: (!) 111/59 (!) 99/57 (!) 96/52 (!) 105/56  Pulse: 77 77 80 76  Resp:   16 18  Temp:  98.1 F (36.7 C) 98.8 F (37.1 C) 98.6 F (37 C)  TempSrc:  Oral Oral Oral  SpO2:  98% 99% 99%  Weight:  81.8 kg    Height:        Intake/Output Summary (Last 24 hours) at 01/03/2020 0756 Last data filed at 01/03/2020 0148 Gross per 24 hour  Intake 450 ml  Output -281 ml  Net 731 ml   Filed Weights   01/02/20 0405 01/02/20 2209 01/03/20 0202  Weight: 78.8 kg 81.8 kg 81.8 kg    Examination:  General exam: No acute distress.  Elderly male lying in bed.  Poor historian. Respiratory system: Bilateral decreased breath sounds at bases.  No wheezing.  Some scattered crackles  cardiovascular system: S1-S2 heard, rate controlled Gastrointestinal system: Abdomen is nondistended, soft and nontender. Normal bowel sounds heard. Extremities: No cyanosis; bilateral lower extremity trace edema    Data Reviewed: I have personally reviewed following labs and imaging studies  CBC: Recent Labs  Lab 12/30/19 0535 12/31/19 0356 01/01/20 0330 01/02/20 0847 01/03/20 0230  WBC 7.8 7.5 7.7 8.4 5.2  HGB 9.1* 8.6* 8.7* 8.9* 8.0*  HCT 27.5* 27.3* 27.1* 28.6* 24.8*  MCV 104.2* 105.4* 105.4* 106.7* 103.3*  PLT 157 153 149* 143* 626*   Basic Metabolic Panel: Recent Labs  Lab 12/28/19 0230 12/29/19 0359 12/31/19 0356 01/02/20 0847 01/03/20 0230  NA 137  --  136 135 133*  K 4.7  --  4.7 5.2* 3.6  CL 97*  --  97* 94* 95*  CO2 27  --  26 27 29   GLUCOSE 89  --  99 93 91  BUN 44*  --  58* 71* 26*  CREATININE 5.35*  --  8.39* 9.44* 4.89*  CALCIUM 8.8*  --  8.3* 8.6* 7.3*  PHOS  --  4.2 3.8  --   --    GFR: Estimated Creatinine Clearance: 14.6 mL/min (A) (by C-G formula based on SCr of 4.89 mg/dL (H)). Liver Function Tests: Recent  Labs  Lab 12/31/19 0356  ALBUMIN 2.3*   No results for input(s): LIPASE, AMYLASE in the last 168 hours. No results for input(s): AMMONIA in the last 168 hours. Coagulation Profile: Recent Labs  Lab 12/30/19 0535 12/31/19 0356 01/01/20 0330 01/02/20 0847 01/03/20 0230  INR 2.0* 2.5* 2.5* 2.8* 3.0*   Cardiac Enzymes: No results for input(s): CKTOTAL, CKMB, CKMBINDEX, TROPONINI in the last 168 hours. BNP (last 3 results) No results for input(s): PROBNP in the last 8760 hours. HbA1C: No results for input(s): HGBA1C in the last 72 hours. CBG: Recent Labs  Lab 01/01/20 2112 01/02/20 0800 01/02/20 1140 01/02/20 1733 01/02/20 2110  GLUCAP 132* 88 108* 104* 113*   Lipid Profile: No results for input(s): CHOL, HDL, LDLCALC, TRIG, CHOLHDL, LDLDIRECT in the last 72 hours. Thyroid Function Tests: No results for input(s): TSH, T4TOTAL, FREET4, T3FREE, THYROIDAB in the last 72 hours. Anemia Panel: No results for input(s): VITAMINB12, FOLATE, FERRITIN, TIBC, IRON, RETICCTPCT in the last 72 hours. Sepsis Labs: No results for input(s): PROCALCITON, LATICACIDVEN in the last 168 hours.  No results found for this or any previous visit (from the past 240 hour(s)).       Radiology Studies: No results found.      Scheduled Meds: . allopurinol  100 mg Oral Once per day on Mon Wed  Fri  . amiodarone  200 mg Oral Daily  . vitamin C  500 mg Oral Daily  . bictegravir-emtricitabine-tenofovir AF  1 tablet Oral Daily  . brimonidine  1 drop Left Eye BID  . calcitRIOL  0.5 mcg Oral Q M,W,F-HD  . Chlorhexidine Gluconate Cloth  6 each Topical Q0600  . Chlorhexidine Gluconate Cloth  6 each Topical Q0600  . darbepoetin (ARANESP) injection - DIALYSIS  200 mcg Intravenous Q Fri-HD  . dorzolamide-timolol  1 drop Left Eye BID  . famotidine  10 mg Oral Daily  . feeding supplement (ENSURE ENLIVE)  237 mL Oral TID BM  . heparin  2,000 Units Dialysis Once in dialysis  . heparin  2,000 Units  Dialysis Once in dialysis  . insulin aspart  0-9 Units Subcutaneous TID WC  . latanoprost  1 drop Left Eye QHS  . midodrine  10 mg Oral Q M,W,F-HD  . montelukast  10 mg Oral QHS  . multivitamin  1 tablet Oral QHS  . psyllium  1 packet Oral Daily  . sevelamer carbonate  2,400 mg Oral TID WC  . warfarin  2.5 mg Oral ONCE-1800  . [START ON 01/04/2020] warfarin  5 mg Oral q1800  . Warfarin - Pharmacist Dosing Inpatient   Does not apply q1800  . zinc sulfate  220 mg Oral Daily   Continuous Infusions:        Aline August, MD [facility] 01/03/2020, 7:56 AM

## 2020-01-03 NOTE — Progress Notes (Addendum)
[facility] Progress Note   Dialysis Orders: [facility] (MWF here) 4h 400/1.5 83.5kg 2/2 bath P4 R TDC/ new R thigh AVG Hep 2000 - Mircera 69mcg IV q 2 weeks (last 12/06/19) - Calcitriol 0.75mcg PO q HD  Assessment/Plan: 1. COVID - s/p IV dexamethasone and remdesivir - off isolation 1/28; breathing easily on room air. 2. ESRD -MTTS doing MWFS here- running 3.5 MWF and 2 hr Sat - extra time PTA was due to high IDGW; using right thigh AVGG since 1/27 - doing well - should be able to get [facility] out next week though need to be mindful his is on coumadin --needs to have BMP redrawn before HD tdoay to make sure we don't run him on a too low K bath- HD orders updated 3. Anemia - 1/p 1 unit PRBC 1/15 - Aranesp 200 q Friday hgb gradually declining down to 8 - given cardiac hx don't want any lower - tsat 21% 1/21 ferritin 874 - given 510 mg Feraheme by primary 1/21 4. Secondary hyperparathyroidism - velphoro upset stomach to d/c - and ^ renvela - on VDRA  5. Chronic hypotension/volume - on midodrine - keeping even Friday and Sat. 6. Nutrition - alb 2.3 - has ensure ordered - add prostat  7. Combined CHF EF 20% - 4 x a week HD - lowest edw here 78.5 - lower at d/c--net UF Wed 753, actually + 281 Friday - treatment finished this am K 3.6--looks like drawn at the end of HD 8. Debility 9. Hx CVA 10. Afib - on amio/coumadin - INR at goal 11. HIV - on medication 12. Disp - SNF recommended   Myriam Jacobson, PA-C [facility] Beeper [phone] 01/03/2020,9:13 AM  LOS: 23 days   Subjective:   No c/o. Wants to try to sit on side of bed and take a bath. Looks like Friday HD finished early Sat am.  Objective Vitals:   01/03/20 0130 01/03/20 0202 01/03/20 0234 01/03/20 0502  BP: (!) 111/59 (!) 99/57 (!) 96/52 (!) 105/56  Pulse: 77 77 80 76  Resp:   16 18  Temp:  98.1 F (36.7 C) 98.8 F (37.1 C) 98.6 F (37 C)  TempSrc:  Oral Oral Oral  SpO2:  98% 99% 99%  Weight:   81.8 kg    Height:       Physical Exam General: NAD breathing easily room air Heart: RRR Lungs: no rales Abdomen: soft NT ND  Extremities: no LE edema Dialysis Access:  Right IJ TDC and right thigh AVGG + bruit   Additional Objective Labs: Lab Results  Component Value Date   INR 3.0 (H) 01/03/2020   INR 2.8 (H) 01/02/2020   INR 2.5 (H) 45/40/9811    Basic Metabolic Panel: Recent Labs  Lab 12/28/19 0230 12/29/19 0359 12/31/19 0356 01/02/20 0847 01/03/20 0230  NA   < >  --  136 135 133*  K   < >  --  4.7 5.2* 3.6  CL   < >  --  97* 94* 95*  CO2   < >  --  26 27 29   GLUCOSE   < >  --  99 93 91  BUN   < >  --  58* 71* 26*  CREATININE   < >  --  8.39* 9.44* 4.89*  CALCIUM   < >  --  8.3* 8.6* 7.3*  PHOS  --  4.2 3.8  --   --    < > =  values in this interval not displayed.   Liver Function Tests: Recent Labs  Lab 12/31/19 0356  ALBUMIN 2.3*   No results for input(s): LIPASE, AMYLASE in the last 168 hours. CBC: Recent Labs  Lab 12/30/19 0535 12/30/19 0535 12/31/19 0356 12/31/19 0356 01/01/20 0330 01/02/20 0847 01/03/20 0230  WBC 7.8   < > 7.5   < > 7.7 8.4 5.2  HGB 9.1*   < > 8.6*   < > 8.7* 8.9* 8.0*  HCT 27.5*   < > 27.3*   < > 27.1* 28.6* 24.8*  MCV 104.2*  --  105.4*  --  105.4* 106.7* 103.3*  PLT 157   < > 153   < > 149* 143* 119*   < > = values in this interval not displayed.   Blood Culture    Component Value Date/Time   SDES BLOOD RIGHT HAND 12/11/2019 2100   SPECREQUEST  12/11/2019 2100    BOTTLES DRAWN AEROBIC ONLY Blood Culture results may not be optimal due to an inadequate volume of blood received in culture bottles   CULT  12/11/2019 2100    NO GROWTH 5 DAYS Performed at [facility] [address] [place], [place] [address]    REPTSTATUS 12/16/2019 FINAL 12/11/2019 2100    Cardiac Enzymes: No results for input(s): CKTOTAL, CKMB, CKMBINDEX, TROPONINI in the last 168 hours. CBG: Recent Labs  Lab 01/02/20 0800  01/02/20 1140 01/02/20 1733 01/02/20 2110 01/03/20 0759  GLUCAP 88 108* 104* 113* 113*   Iron Studies: No results for input(s): IRON, TIBC, TRANSFERRIN, FERRITIN in the last 72 hours. Lab Results  Component Value Date   INR 3.0 (H) 01/03/2020   INR 2.8 (H) 01/02/2020   INR 2.5 (H) 01/01/2020   Studies/Results: No results found. Medications:  . allopurinol  100 mg Oral Once per day on Mon Wed Fri  . amiodarone  200 mg Oral Daily  . vitamin C  500 mg Oral Daily  . bictegravir-emtricitabine-tenofovir AF  1 tablet Oral Daily  . brimonidine  1 drop Left Eye BID  . calcitRIOL  0.5 mcg Oral Q M,W,F-HD  . Chlorhexidine Gluconate Cloth  6 each Topical Q0600  . Chlorhexidine Gluconate Cloth  6 each Topical Q0600  . darbepoetin (ARANESP) injection - DIALYSIS  200 mcg Intravenous Q Fri-HD  . dorzolamide-timolol  1 drop Left Eye BID  . famotidine  10 mg Oral Daily  . feeding supplement (ENSURE ENLIVE)  237 mL Oral TID BM  . heparin  2,000 Units Dialysis Once in dialysis  . heparin  2,000 Units Dialysis Once in dialysis  . insulin aspart  0-9 Units Subcutaneous TID WC  . latanoprost  1 drop Left Eye QHS  . midodrine  10 mg Oral Q M,W,F-HD  . montelukast  10 mg Oral QHS  . multivitamin  1 tablet Oral QHS  . psyllium  1 packet Oral Daily  . sevelamer carbonate  2,400 mg Oral TID WC  . warfarin  2.5 mg Oral ONCE-1800  . [START ON 01/04/2020] warfarin  5 mg Oral q1800  . Warfarin - Pharmacist Dosing Inpatient   Does not apply q1800  . zinc sulfate  220 mg Oral Daily

## 2020-01-03 NOTE — Progress Notes (Signed)
Pt back from dialysis. VSS. Will continue to monitor pt.

## 2020-01-03 NOTE — Progress Notes (Signed)
ANTICOAGULATION CONSULT NOTE - Follow Up Consult  Pharmacy Consult for Coumadin Indication: atrial fibrillation  Allergies  Allergen Reactions  . Dextromethorphan-Guaifenesin     UNKNOWN   . Tocotrienols     UNKNOWN   . Losartan Potassium Other (See Comments)    Causes constipation    Patient Measurements: Height: 6\' 2"  (188 cm) Weight: 180 lb 5.4 oz (81.8 kg) IBW/kg (Calculated) : 82.2  Vital Signs: Temp: 98.6 F (37 C) (01/30 0502) Temp Source: Oral (01/30 0502) BP: 105/56 (01/30 0502) Pulse Rate: 76 (01/30 0502)  Labs: Recent Labs    01/01/20 0330 01/01/20 0330 01/02/20 0847 01/03/20 0230  HGB 8.7*   < > 8.9* 8.0*  HCT 27.1*  --  28.6* 24.8*  PLT 149*  --  143* 119*  LABPROT 26.6*  --  29.8* 31.5*  INR 2.5*  --  2.8* 3.0*  CREATININE  --   --  9.44* 4.89*   < > = values in this interval not displayed.    Estimated Creatinine Clearance: 14.6 mL/min (A) (by C-G formula based on SCr of 4.89 mg/dL (H)).  Assessment:  Anticoag: Warfarin for hx afib (CHADS2VASc = 8)- d-dimer 5.26 (last 1/23). Warfarin PTA 2.5mg  MF, 5mg  AOD (INR 1.2 on admit); on amiodarone also PTA.   INR 3.0 today; at the upper end of goal. Will half dose tonight.   Goal of Therapy:  INR 2-3 Monitor platelets by anticoagulation protocol: Yes   Plan:  Give Warfarin 2.5mg  today, to resume 5mg  daily dose tomorrow.   Daily INR / CBC    Thank you for the interesting consult and for involving pharmacy in this patient's care. Tamela Gammon, PharmD, BCPS 01/03/2020 7:11 AM PGY-2 Pharmacy Administration Resident Please check [email or web address] for unit-specific pharmacist phone numbers

## 2020-01-04 DIAGNOSIS — B20 Human immunodeficiency virus [HIV] disease: Secondary | ICD-10-CM | POA: Diagnosis not present

## 2020-01-04 DIAGNOSIS — Z992 Dependence on renal dialysis: Secondary | ICD-10-CM | POA: Diagnosis not present

## 2020-01-04 DIAGNOSIS — N186 End stage renal disease: Secondary | ICD-10-CM | POA: Diagnosis not present

## 2020-01-04 LAB — BASIC METABOLIC PANEL
Anion gap: 11 (ref 5–15)
BUN: 26 mg/dL — ABNORMAL HIGH (ref 8–23)
CO2: 28 mmol/L (ref 22–32)
Calcium: 8.2 mg/dL — ABNORMAL LOW (ref 8.9–10.3)
Chloride: 100 mmol/L (ref 98–111)
Creatinine, Ser: 4.93 mg/dL — ABNORMAL HIGH (ref 0.61–1.24)
GFR calc Af Amer: 12 mL/min — ABNORMAL LOW (ref >60)
GFR calc non Af Amer: 11 mL/min — ABNORMAL LOW (ref >60)
Glucose, Bld: 93 mg/dL (ref 70–99)
Potassium: 4.3 mmol/L (ref 3.5–5.1)
Sodium: 139 mmol/L (ref 135–145)

## 2020-01-04 LAB — GLUCOSE, CAPILLARY
Glucose-Capillary: 92 mg/dL (ref 70–99)
Glucose-Capillary: 110 mg/dL — ABNORMAL HIGH (ref 70–99)
Glucose-Capillary: 102 mg/dL — ABNORMAL HIGH (ref 70–99)
Glucose-Capillary: 125 mg/dL — ABNORMAL HIGH (ref 70–99)

## 2020-01-04 LAB — CBC
HCT: 26.1 % — ABNORMAL LOW (ref 39.0–52.0)
Hemoglobin: 8.1 g/dL — ABNORMAL LOW (ref 13.0–17.0)
MCH: 33.3 pg (ref 26.0–34.0)
MCHC: 31 g/dL (ref 30.0–36.0)
MCV: 107.4 fL — ABNORMAL HIGH (ref 80.0–100.0)
Platelets: 115 10*3/uL — ABNORMAL LOW (ref 150–400)
RBC: 2.43 MIL/uL — ABNORMAL LOW (ref 4.22–5.81)
RDW: 19.8 % — ABNORMAL HIGH (ref 11.5–15.5)
WBC: 5.3 10*3/uL (ref 4.0–10.5)
nRBC: 0 % (ref 0.0–0.2)

## 2020-01-04 LAB — PROTIME-INR
INR: 3.1 — ABNORMAL HIGH (ref 0.8–1.2)
Prothrombin Time: 31.7 seconds — ABNORMAL HIGH (ref 11.4–15.2)

## 2020-01-04 MED ORDER — WARFARIN SODIUM 2.5 MG PO TABS
2.5000 mg | ORAL_TABLET | Freq: Once | ORAL | Status: AC
  Administered 2020-01-04: 2.5 mg via ORAL
  Filled 2020-01-04: qty 1

## 2020-01-04 MED ORDER — WARFARIN SODIUM 5 MG PO TABS
5.0000 mg | ORAL_TABLET | Freq: Every day | ORAL | Status: DC
  Filled 2020-01-04: qty 1

## 2020-01-04 NOTE — Progress Notes (Addendum)
ANTICOAGULATION CONSULT NOTE - Follow Up Consult  Pharmacy Consult for Coumadin Indication: atrial fibrillation  Allergies  Allergen Reactions  . Dextromethorphan-Guaifenesin     UNKNOWN   . Tocotrienols     UNKNOWN   . Losartan Potassium Other (See Comments)    Causes constipation    Patient Measurements: Height: 6\' 2"  (188 cm) Weight: 177 lb 14.6 oz (80.7 kg) IBW/kg (Calculated) : 82.2  Vital Signs: Temp: 99.6 F (37.6 C) (01/31 0521) Temp Source: Oral (01/31 0521) BP: 113/58 (01/31 0521) Pulse Rate: 80 (01/31 0521)  Labs: Recent Labs    01/02/20 0847 01/02/20 0847 01/03/20 0230 01/03/20 1508 01/04/20 0434  HGB 8.9*   < > 8.0*  --  8.1*  HCT 28.6*  --  24.8*  --  26.1*  PLT 143*  --  119*  --  115*  LABPROT 29.8*  --  31.5*  --  31.7*  INR 2.8*  --  3.0*  --  3.1*  CREATININE 9.44*   < > 4.89* 6.83* 4.93*   < > = values in this interval not displayed.    Estimated Creatinine Clearance: 14.3 mL/min (A) (by C-G formula based on SCr of 4.93 mg/dL (H)).  Assessment:  Anticoag: Warfarin for hx afib (CHADS2VASc = 8)- d-dimer 5.26 (last 1/23). Warfarin PTA 2.5mg  MF, 5mg  AOD (INR 1.2 on admit); on amiodarone also PTA.   INR 3.1 today; Currently above goal (2-3). The increase today of "0.1" INR after receiving 5mg  on Friday and 2.5mg  on Saturday is anticipated to stop increasing or drop into therapeutic range tomorrow Monday 2/1.  Will still give a dose of 2.5mg  today, and continue to monitior. It is expected that the INR will decrease to within range tomorrow based on her home dose of 2.5mg  M+F, and 5mg  AOD. No signs or symptoms of bleeding noted. Patient is also on amiodarone which can impact INR levels.   Goal of Therapy:  INR 2-3 Monitor platelets by anticoagulation protocol: Yes   Plan:  Give Warfarin 2.5mg  today, to resume 5mg  daily dose tomorrow (2/1).   Daily INR / CBC    Thank you for the interesting consult and for involving pharmacy in this  patient's care. Tamela Gammon, PharmD, BCPS 01/04/2020 7:30 AM PGY-2 Pharmacy Administration Resident Please check [email or web address] for unit-specific pharmacist phone numbers

## 2020-01-04 NOTE — Progress Notes (Signed)
Patient ID: David [name], male   DOB: 08-20-1941, 79 y.o.   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 During the hospitalization, patient was not started on any treatment for Covid as he was not hypoxic however, subsequently his respiratory rate increased and CRP was 22.9 and was subsequently started on dexamethasone and remdesivir on 12/12/2019 He was found to have elevated troponin: Cardiology was consulted.  Cardiology deemed the patient not a candidate for cardiac intervention and troponin elevation was secondary to demand ischemia and no therapies recommended. Nephrology was consulted as well. Palliative Care was consulted and subsequently patient was made a DNR. Hospital course complicated by ongoing failure to thrive, poor p.o. intake, palliative care conversations ongoing; finally with improving oral intake and now plan for SNF placement with palliative care follow-up. He is medically stable for discharge to SNF, awaiting bed.  Completed Covid isolation on 01/01/2020.  Assessment & Plan:   COVID-19 virus infection -Tested positive on 12/11/2019 -Has completed 5 days of remdesivir, 4 days of dexamethasone which was stopped on 12/15/2019 due to uremia (BUN to 99) -Respiratory status has improved and stable.  Currently on room air -Remains debilitated and frail.  PT evaluation recommended SNF placement.   Social worker following. -Completed Covid isolation on 01/01/2020  Nausea and vomiting -Most likely from Covid infection and uremia.  Resolved  Elevated troponin in a patient with extensive cardiac history End-stage heart failure, systolic and diastolic combined CHF History of PEA arrest -EF of 20% -Troponin mildly elevated during admission but did not trend up.  EKG unchanged with baseline LBBB -Cardiology consulted-no indication for cardiac intervention due to end-stage heart failure and recommending optimizing volume status with hemodialysis -Palliative care consulted, as above goals of care conversation had and he is now DNR -Poor prognosis  End-stage renal disease on hemodialysis Hyperkalemia: Resolved -Nephrology following.  Dialysis as per nephrology schedule.  Continue midodrine on dialysis days.  Chronic hypotension -Continue midodrine on dialysis days  Anemia of chronic disease/microcytic anemia -Hemoglobin stable.  Monitor intermittently  Thrombocytopenia -Questionable cause.  Monitor intermittently.  No signs of bleeding  Paroxysmal A. fib  -on Coumadin and amiodarone.  Currently rate controlled.  Monitor INR.  Chronic pain -Continue with tramadol  History of HIV -Stable.  Continue [name].  Last CD4 count was 413 in 09/2019 with undetectable viral load  Prediabetes -Continue SSI  Stage II pressure injury right buttock: Not present on admission Stage II pressure injury on scrotum: Not present on admission -Continue wound care   Ambulatory dysfunction Generalized deconditioning -PT/OT recommending SNF.  Social worker following.  Palliative care has evaluated the patient and patient is currently DNR.  Palliative care recommending palliative care follow-up as an outpatient.  DVT prophylaxis: Coumadin Code Status: DNR Family Communication: None at bedside Disposition Plan: SNF once bed is available.  Currently medically stable for discharge.  Consultants:  Cardiology/nephrology/palliative care  Procedures: None  Antimicrobials: Has already completed remdesivir treatment   Subjective: Patient seen and  examined at bedside.  Poor historian.  No worsening shortness of breath reported.  No overnight fever or vomiting.   Objective: Vitals:   01/03/20 1733 01/03/20 2058 01/04/20 0500 01/04/20 0521  BP: (!) 104/59 (!) 104/54  (!) 113/58  Pulse: 77 75  80  Resp: 19 15  18   Temp: 97.8 F (36.6 C) 98.3 F (36.8 C)  99.6 F (37.6 C)  TempSrc: Oral Oral  Oral  SpO2: 97% 100%  100%  Weight:   80.7 kg   Height:        Intake/Output Summary (Last 24 hours) at 01/04/2020 0758 Last data filed at 01/04/2020 0506 Gross per 24 hour  Intake 110 ml  Output 50 ml  Net 60 ml   Filed Weights   01/03/20 0202 01/03/20 1447 01/04/20 0500  Weight: 81.8 kg 80.7 kg 80.7 kg    Examination:  General exam: No distress.  Elderly male lying in bed.  Poor historian. Respiratory system: Bilateral decreased breath sounds at bases with some scattered crackles.   Cardiovascular system: Regular, S1-S2 heard Gastrointestinal system: Abdomen is nondistended, soft and nontender.  Bowel sounds heard.   Extremities: No cyanosis; bilateral lower extremity trace edema    Data Reviewed: I have personally reviewed following labs and imaging studies  CBC: Recent Labs  Lab 12/31/19 0356 01/01/20 0330 01/02/20 0847 01/03/20 0230 01/04/20 0434  WBC 7.5 7.7 8.4 5.2 5.3  HGB 8.6* 8.7* 8.9* 8.0* 8.1*  HCT 27.3* 27.1* 28.6* 24.8* 26.1*  MCV 105.4* 105.4* 106.7* 103.3* 107.4*  PLT 153 149* 143* 119* 450*   Basic Metabolic Panel: Recent Labs  Lab 12/29/19 0359 12/31/19 0356 01/02/20 0847 01/03/20 0230 01/03/20 1508 01/04/20 0434  NA  --  136 135 133* 136 139  K  --  4.7 5.2* 3.6 4.5 4.3  CL  --  97* 94* 95* 96* 100  CO2  --  26 27 29 27 28   GLUCOSE  --  99 93 91 153* 93  BUN  --  58* 71* 26* 39* 26*  CREATININE  --  8.39* 9.44* 4.89* 6.83* 4.93*   CALCIUM  --  8.3* 8.6* 7.3* 7.8* 8.2*  PHOS 4.2 3.8  --   --  2.1*  --    GFR: Estimated Creatinine Clearance: 14.3 mL/min (A) (by C-G formula based on SCr of 4.93 mg/dL (H)). Liver Function Tests: Recent Labs  Lab 12/31/19 0356 01/03/20 1508  ALBUMIN 2.3* 2.2*   No results for input(s): LIPASE, AMYLASE in the last 168 hours. No results for input(s): AMMONIA in the last 168 hours. Coagulation Profile: Recent Labs  Lab 12/31/19 0356 01/01/20 0330 01/02/20 0847 01/03/20 0230 01/04/20 0434  INR 2.5* 2.5* 2.8* 3.0* 3.1*   Cardiac Enzymes: No results for input(s): CKTOTAL, CKMB, CKMBINDEX, TROPONINI in the last 168 hours. BNP (last 3 results) No results for input(s): PROBNP in the last 8760 hours. HbA1C: No results for input(s): HGBA1C in the last 72 hours. CBG: Recent Labs  Lab 01/02/20 2110 01/03/20 0759 01/03/20 1133 01/03/20 1732 01/03/20 2056  GLUCAP 113* 113* 88 89 109*   Lipid Profile: No results for input(s): CHOL, HDL, LDLCALC, TRIG, CHOLHDL, LDLDIRECT in the last 72 hours. Thyroid Function Tests: No results for input(s): TSH, T4TOTAL, FREET4, T3FREE, THYROIDAB in the last 72 hours. Anemia Panel: No results for input(s): VITAMINB12, FOLATE, FERRITIN, TIBC, IRON, RETICCTPCT in the last 72 hours. Sepsis Labs: No results for input(s): PROCALCITON, LATICACIDVEN in the last 168 hours.  No results  found for this or any previous visit (from the past 240 hour(s)).       Radiology Studies: No results found.      Scheduled Meds: . allopurinol  100 mg Oral Once per day on Mon Wed Fri  . amiodarone  200 mg Oral Daily  . vitamin C  500 mg Oral Daily  . bictegravir-emtricitabine-tenofovir AF  1 tablet Oral Daily  . brimonidine  1 drop Left Eye BID  . calcitRIOL  0.5 mcg Oral Q M,W,F-HD  . Chlorhexidine Gluconate Cloth  6 each Topical Q0600  . Chlorhexidine Gluconate Cloth  6 each Topical Q0600  . darbepoetin (ARANESP) injection - DIALYSIS  200 mcg  Intravenous Q Fri-HD  . dorzolamide-timolol  1 drop Left Eye BID  . famotidine  10 mg Oral Daily  . feeding supplement (ENSURE ENLIVE)  237 mL Oral TID BM  . feeding supplement (PRO-STAT SUGAR FREE 64)  30 mL Oral BID  . heparin  2,000 Units Dialysis Once in dialysis  . heparin  2,000 Units Dialysis Once in dialysis  . insulin aspart  0-9 Units Subcutaneous TID WC  . latanoprost  1 drop Left Eye QHS  . midodrine  10 mg Oral Q M,W,F-HD  . montelukast  10 mg Oral QHS  . multivitamin  1 tablet Oral QHS  . psyllium  1 packet Oral Daily  . sevelamer carbonate  2,400 mg Oral TID WC  . warfarin  2.5 mg Oral ONCE-1800  . [START ON 01/05/2020] warfarin  5 mg Oral q1800  . Warfarin - Pharmacist Dosing Inpatient   Does not apply q1800  . zinc sulfate  220 mg Oral Daily   Continuous Infusions:        Aline August, MD [facility] 01/04/2020, 7:58 AM

## 2020-01-04 NOTE — Progress Notes (Signed)
[facility] Progress Note   Dialysis Orders: [facility] (MWF here) 4h 400/1.5 83.5kg 2/2 bath P4 R TDC/ new R thigh AVG Hep 2000 - Mircera 57mcg IV q 2 weeks (last 12/06/19) - Calcitriol 0.63mcg PO q HD  Assessment/Plan: 1. COVID - s/p IV dexamethasone and remdesivir - off isolation 1/28; breathing easily on room air. 2. ESRD -MTTS doing MWFS here- running 3.5 MWF and 2 hr Sat - extra time PTA was due to high IDGW; using right thigh AVGG since 1/27 - doing well - should be able to get [facility] out this week though need to be mindful his is on coumadin --K 4.5 Saturday - ran on 2 K bath - K 4.3 today  3. Anemia - 1/p 1 unit PRBC 1/15 - Aranesp 200 q Friday hgb gradually declining down to 8.1 stable - given cardiac hx don't want any lower - tsat 21% 1/21 ferritin 874 - given 510 mg Feraheme by primary 1/21 4. Secondary hyperparathyroidism - velphoro upset stomach so was  Changed to higher renvela - P 2.1 - will decrease renvela to 2 ac  - on VDRA  5. Chronic hypotension/volume - on midodrine - kept even  Sat. Pre weight 80.7 - likely will have a lower edw at d/c - need standing wts and orthostatics pre HD Monday 6. Nutrition - alb 2.3 - has ensure ordered - add prostat  7. Combined CHF EF 20% - 4 x a week HD - lowest edw here 78.5 - lower at d/c--net UF Wed 753, actually + 281 Friday - intake in controlled environment is much less than when at home 8. Debility 9. Hx CVA 10. Afib - on amio/coumadin - INR 3.1  11. HIV - on medication 12. Disp - SNF recommended pending  [facility] Beeper [phone] 01/04/2020,7:56 AM  LOS: 24 days   Subjective:   No c/o  Objective Vitals:   01/03/20 1733 01/03/20 2058 01/04/20 0500 01/04/20 0521  BP: (!) 104/59 (!) 104/54  (!) 113/58  Pulse: 77 75  80  Resp: 19 15  18   Temp: 97.8 F (36.6 C) 98.3 F (36.8 C)  99.6 F (37.6 C)  TempSrc: Oral Oral  Oral  SpO2: 97% 100%  100%  Weight:   80.7 kg    Height:       Physical Exam General: NAD breathing easily room air Heart: RRR Lungs: no rales Abdomen: soft NT ND  Extremities: no LE edema Dialysis Access:  Right IJ TDC and right thigh AVGG + bruit   Additional Objective Labs: Lab Results  Component Value Date   INR 3.1 (H) 01/04/2020   INR 3.0 (H) 01/03/2020   INR 2.8 (H) 71/69/6789    Basic Metabolic Panel: Recent Labs  Lab 12/29/19 0359 12/31/19 0356 01/02/20 0847 01/03/20 0230 01/03/20 1508 01/04/20 0434  NA  --  136   < > 133* 136 139  K  --  4.7   < > 3.6 4.5 4.3  CL  --  97*   < > 95* 96* 100  CO2  --  26   < > 29 27 28   GLUCOSE  --  99   < > 91 153* 93  BUN  --  58*   < > 26* 39* 26*  CREATININE  --  8.39*   < > 4.89* 6.83* 4.93*  CALCIUM  --  8.3*   < > 7.3* 7.8* 8.2*  PHOS 4.2 3.8  --   --  2.1*  --    < > = values in this interval not displayed.   Liver Function Tests: Recent Labs  Lab 12/31/19 0356 01/03/20 1508  ALBUMIN 2.3* 2.2*   No results for input(s): LIPASE, AMYLASE in the last 168 hours. CBC: Recent Labs  Lab 12/31/19 0356 12/31/19 0356 01/01/20 0330 01/01/20 0330 01/02/20 0847 01/03/20 0230 01/04/20 0434  WBC 7.5   < > 7.7   < > 8.4 5.2 5.3  HGB 8.6*   < > 8.7*   < > 8.9* 8.0* 8.1*  HCT 27.3*   < > 27.1*   < > 28.6* 24.8* 26.1*  MCV 105.4*  --  105.4*  --  106.7* 103.3* 107.4*  PLT 153   < > 149*   < > 143* 119* 115*   < > = values in this interval not displayed.   Blood Culture    Component Value Date/Time   SDES BLOOD RIGHT HAND 12/11/2019 2100   SPECREQUEST  12/11/2019 2100    BOTTLES DRAWN AEROBIC ONLY Blood Culture results may not be optimal due to an inadequate volume of blood received in culture bottles   CULT  12/11/2019 2100    NO GROWTH 5 DAYS Performed at [facility] [address], [place], [place] [address]    REPTSTATUS 12/16/2019 FINAL 12/11/2019 2100    Cardiac Enzymes: No results for input(s): CKTOTAL, CKMB, CKMBINDEX, TROPONINI in the  last 168 hours. CBG: Recent Labs  Lab 01/02/20 2110 01/03/20 0759 01/03/20 1133 01/03/20 1732 01/03/20 2056  GLUCAP 113* 113* 88 89 109*   Iron Studies: No results for input(s): IRON, TIBC, TRANSFERRIN, FERRITIN in the last 72 hours. Lab Results  Component Value Date   INR 3.1 (H) 01/04/2020   INR 3.0 (H) 01/03/2020   INR 2.8 (H) 01/02/2020   Studies/Results: No results found. Medications:  . allopurinol  100 mg Oral Once per day on Mon Wed Fri  . amiodarone  200 mg Oral Daily  . vitamin C  500 mg Oral Daily  . bictegravir-emtricitabine-tenofovir AF  1 tablet Oral Daily  . brimonidine  1 drop Left Eye BID  . calcitRIOL  0.5 mcg Oral Q M,W,F-HD  . Chlorhexidine Gluconate Cloth  6 each Topical Q0600  . Chlorhexidine Gluconate Cloth  6 each Topical Q0600  . darbepoetin (ARANESP) injection - DIALYSIS  200 mcg Intravenous Q Fri-HD  . dorzolamide-timolol  1 drop Left Eye BID  . famotidine  10 mg Oral Daily  . feeding supplement (ENSURE ENLIVE)  237 mL Oral TID BM  . feeding supplement (PRO-STAT SUGAR FREE 64)  30 mL Oral BID  . heparin  2,000 Units Dialysis Once in dialysis  . heparin  2,000 Units Dialysis Once in dialysis  . insulin aspart  0-9 Units Subcutaneous TID WC  . latanoprost  1 drop Left Eye QHS  . midodrine  10 mg Oral Q M,W,F-HD  . montelukast  10 mg Oral QHS  . multivitamin  1 tablet Oral QHS  . psyllium  1 packet Oral Daily  . sevelamer carbonate  2,400 mg Oral TID WC  . warfarin  2.5 mg Oral ONCE-1800  . [START ON 01/05/2020] warfarin  5 mg Oral q1800  . Warfarin - Pharmacist Dosing Inpatient   Does not apply q1800  . zinc sulfate  220 mg Oral Daily

## 2020-01-05 LAB — BASIC METABOLIC PANEL
Anion gap: 11 (ref 5–15)
BUN: 47 mg/dL — ABNORMAL HIGH (ref 8–23)
CO2: 29 mmol/L (ref 22–32)
Calcium: 8.7 mg/dL — ABNORMAL LOW (ref 8.9–10.3)
Chloride: 99 mmol/L (ref 98–111)
Creatinine, Ser: 7.21 mg/dL — ABNORMAL HIGH (ref 0.61–1.24)
GFR calc Af Amer: 8 mL/min — ABNORMAL LOW (ref >60)
GFR calc non Af Amer: 7 mL/min — ABNORMAL LOW (ref >60)
Glucose, Bld: 101 mg/dL — ABNORMAL HIGH (ref 70–99)
Potassium: 4.8 mmol/L (ref 3.5–5.1)
Sodium: 139 mmol/L (ref 135–145)

## 2020-01-05 LAB — GLUCOSE, CAPILLARY
Glucose-Capillary: 92 mg/dL (ref 70–99)
Glucose-Capillary: 136 mg/dL — ABNORMAL HIGH (ref 70–99)
Glucose-Capillary: 105 mg/dL — ABNORMAL HIGH (ref 70–99)
Glucose-Capillary: 90 mg/dL (ref 70–99)

## 2020-01-05 LAB — CBC
HCT: 24.8 % — ABNORMAL LOW (ref 39.0–52.0)
Hemoglobin: 7.6 g/dL — ABNORMAL LOW (ref 13.0–17.0)
MCH: 32.8 pg (ref 26.0–34.0)
MCHC: 30.6 g/dL (ref 30.0–36.0)
MCV: 106.9 fL — ABNORMAL HIGH (ref 80.0–100.0)
Platelets: 116 10*3/uL — ABNORMAL LOW (ref 150–400)
RBC: 2.32 MIL/uL — ABNORMAL LOW (ref 4.22–5.81)
RDW: 19.3 % — ABNORMAL HIGH (ref 11.5–15.5)
WBC: 5.6 10*3/uL (ref 4.0–10.5)
nRBC: 0 % (ref 0.0–0.2)

## 2020-01-05 LAB — PROTIME-INR
INR: 2.7 — ABNORMAL HIGH (ref 0.8–1.2)
Prothrombin Time: 28.4 seconds — ABNORMAL HIGH (ref 11.4–15.2)

## 2020-01-05 LAB — PREPARE RBC (CROSSMATCH)

## 2020-01-05 MED ORDER — WARFARIN SODIUM 5 MG PO TABS
5.0000 mg | ORAL_TABLET | Freq: Once | ORAL | Status: AC
  Administered 2020-01-05: 5 mg via ORAL

## 2020-01-05 MED ORDER — DARBEPOETIN ALFA 200 MCG/0.4ML IJ SOSY: 200.0000 ug | PREFILLED_SYRINGE | INTRAMUSCULAR | Status: DC

## 2020-01-05 MED ORDER — SODIUM CHLORIDE 0.9% IV SOLUTION: Freq: Once | INTRAVENOUS | Status: AC

## 2020-01-05 NOTE — Progress Notes (Signed)
Physical Therapy Treatment Patient Details Name: David [name] MRN: [id number] DOB: 1941/03/06 Today's Date: 01/05/2020    History of Present Illness Patient is a 79 year old male with history of combined systolic and diastolic heart failure (EF 20 to 25% 03/04/2019), iron deficiency anemia, atrial fibrillation on Coumadin only tolerates amiodarone, PEA arrest 02/2018, ESRD on HD [facility], GERD, HIV, prostate cancer, CVA, hypertension who presented to the ED on 1/7 with complaints of generalized weakness and tremors and not feeling well x5 days.  Found to be Covid +.    PT Comments    Pt making good progress today.  He demonstrated improved transfers and was able to ambulate 8'x2 with RW and min A.  Continues to need cues for transfer and gait technique.     Follow Up Recommendations  SNF;Supervision/Assistance - 24 hour     Equipment Recommendations  Other (comment)(TBD next venue)    Recommendations for Other Services       Precautions / Restrictions Precautions Precautions: Fall    Mobility  Bed Mobility Overal bed mobility: Needs Assistance Bed Mobility: Sidelying to Sit;Sit to Supine Rolling: Supervision Sidelying to sit: Min assist   Sit to supine: Min assist   General bed mobility comments: Pt was able to roll on side and get feet off the bed, required min A to boost trunk.  For return to supine required assist with feet.  Transfers Overall transfer level: Needs assistance Equipment used: Rolling walker (2 wheeled) Transfers: Sit to/from Stand Sit to Stand: Min assist;From elevated surface         General transfer comment: sit to stand x 3 with min A to boost; pt able to verbalize to scoot forward and push up  Ambulation/Gait Ambulation/Gait assistance: Min assist Gait Distance (Feet): 8 Feet Assistive device: Rolling walker (2 wheeled) Gait Pattern/deviations: Decreased stride length;Decreased weight shift to right;Decreased weight shift to  left;Shuffle;Trunk flexed Gait velocity: decreased   General Gait Details: 8'x2 then 4' side steps; cues and assist with weight shift; cues for increased stride length ; assist to manage RW;  tended to flex trunk but did have LOB posteriorly when backing to bed   Stairs             Wheelchair Mobility    Modified Rankin (Stroke Patients Only)       Balance Overall balance assessment: Needs assistance Sitting-balance support: No upper extremity supported;Feet supported Sitting balance-Leahy Scale: Good Sitting balance - Comments: Able to sit EOB 3 mins without LOB   Standing balance support: Bilateral upper extremity supported;During functional activity Standing balance-Leahy Scale: Poor Standing balance comment: required use of RW; LOB posteriorly with backing to bed                            Cognition Arousal/Alertness: Awake/alert Behavior During Therapy: WFL for tasks assessed/performed Overall Cognitive Status: No family/caregiver present to determine baseline cognitive functioning                                 General Comments: Pt able to carry on appropriate conversation, repeat cues for standing technique, and follow commands.  Did require increased time for planning transfers.      Exercises      General Comments General comments (skin integrity, edema, etc.): vss      Pertinent Vitals/Pain Pain Assessment: No/denies pain    Home Living  Prior Function            PT Goals (current goals can now be found in the care plan section) Progress towards PT goals: Progressing toward goals    Frequency    Min 3X/week      PT Plan Current plan remains appropriate    Co-evaluation              AM-PAC PT "6 Clicks" Mobility   Outcome Measure  Help needed turning from your back to your side while in a flat bed without using bedrails?: None Help needed moving from lying on your back to  sitting on the side of a flat bed without using bedrails?: A Little Help needed moving to and from a bed to a chair (including a wheelchair)?: A Little Help needed standing up from a chair using your arms (e.g., wheelchair or bedside chair)?: A Little Help needed to walk in hospital room?: A Little Help needed climbing 3-5 steps with a railing? : A Lot 6 Click Score: 18    End of Session Equipment Utilized During Treatment: Gait belt Activity Tolerance: Patient tolerated treatment well Patient left: in chair;with call bell/phone within reach;with chair alarm set Nurse Communication: Mobility status PT Visit Diagnosis: Other abnormalities of gait and mobility (R26.89);Muscle weakness (generalized) (M62.81);Other symptoms and signs involving the nervous system (R29.898)     Time: 0156-1537 PT Time Calculation (min) (ACUTE ONLY): 26 min  Charges:  $Gait Training: 8-22 mins $Therapeutic Activity: 8-22 mins                     Maggie Font, PT Acute Rehab Services Pager [phone] [facility] [phone] [facility] [place] 01/05/2020, 4:34 PM

## 2020-01-05 NOTE — Progress Notes (Signed)
Patient ID: David [name], male   DOB: Sep 06, 1941, 79 y.o.   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 During the hospitalization, patient was not started on any treatment for Covid as he was not hypoxic however, subsequently his respiratory rate increased and CRP was 22.9 and was subsequently started on dexamethasone and remdesivir on 12/12/2019 He was found to have elevated troponin: Cardiology was consulted.  Cardiology deemed the patient not a candidate for cardiac intervention and troponin elevation was secondary to demand ischemia and no therapies recommended. Nephrology was consulted as well. Palliative Care was consulted and subsequently patient was made a DNR. Hospital course complicated by ongoing failure to thrive, poor p.o. intake, palliative care conversations ongoing; finally with improving oral intake and now plan for SNF placement with palliative care follow-up. He is medically stable for discharge to SNF, awaiting bed.  Completed Covid isolation on 01/01/2020.  Assessment & Plan:   COVID-19 virus infection -Tested positive on 12/11/2019 -Has completed 5 days of remdesivir, 4 days of dexamethasone which was stopped on 12/15/2019 due to uremia (BUN to 99) -Respiratory status has improved and stable.  Currently on room air -Remains debilitated and frail.  PT evaluation recommended SNF placement.   Social worker following. -Completed Covid isolation on 01/01/2020 -Currently afebrile and hemodynamically stable.  Nausea and vomiting -Most likely from Covid infection and uremia.  Resolved  Elevated troponin in a patient with extensive cardiac history End-stage heart failure, systolic and diastolic combined CHF History of PEA arrest -EF of 20% -Troponin mildly elevated during admission but did not trend up.  EKG unchanged with baseline LBBB -Cardiology consulted-no indication for cardiac intervention due to end-stage heart failure and recommending optimizing volume status with hemodialysis -Palliative care consulted, as above goals of care conversation had and he is now DNR -Poor prognosis  End-stage renal disease on hemodialysis Hyperkalemia: Resolved -Nephrology following.  Dialysis as per nephrology schedule.  Continue midodrine on dialysis days.  Chronic hypotension -Continue midodrine on dialysis days  Anemia of chronic disease/microcytic anemia -Hemoglobin stable.  Monitor intermittently  Thrombocytopenia -Questionable cause.  Monitor intermittently.  No signs of bleeding  Paroxysmal A. fib  -on Coumadin and amiodarone.  Currently rate controlled.  Monitor INR.  Chronic pain -Continue with tramadol  History of HIV -Stable.  Continue [name].  Last CD4 count was 413 in 09/2019 with undetectable viral load  Prediabetes -Continue SSI  Stage II pressure injury right buttock: Not present on admission Stage II pressure injury on scrotum: Not present on admission -Continue wound care   Ambulatory dysfunction Generalized deconditioning -PT/OT recommending SNF.  Social worker following.  Palliative care has evaluated the patient and patient is currently DNR.  Palliative care recommending palliative care follow-up as an outpatient.  DVT prophylaxis: Coumadin Code Status: DNR Family Communication: None at bedside Disposition Plan: SNF once bed is available.   Currently medically stable for discharge.  Consultants: Cardiology/nephrology/palliative care  Procedures: None  Antimicrobials: Has already completed remdesivir treatment  Subjective: Patient seen and examined at bedside.  Poor historian.  No overnight fever, vomiting or worsening shortness of breath reported.   Objective: Vitals:   01/04/20 1520 01/04/20 2205 01/05/20 0310 01/05/20 0500  BP: (!) 109/54 101/62 122/63   Pulse: 81 85 90   Resp: 17 18 18    Temp: (!) 97.3 F (36.3 C) 98.9 F (37.2 C) 98.2 F (36.8 C)   TempSrc: Oral Oral Oral   SpO2: 100% 100% 100%   Weight:    80.7 kg  Height:        Intake/Output Summary (Last 24 hours) at 01/05/2020 0752 Last data filed at 01/05/2020 0328 Gross per 24 hour  Intake 687 ml  Output 50 ml  Net 637 ml   Filed Weights   01/03/20 1447 01/04/20 0500 01/05/20 0500  Weight: 80.7 kg 80.7 kg 80.7 kg    Examination:  General exam: No acute distress.  Looks chronically ill.  Elderly male lying in bed.  Poor historian. Respiratory system: Bilateral decreased breath sounds at bases with some scattered crackles.   Cardiovascular system: Regular, S1-S2 heard Gastrointestinal system: Abdomen is nondistended, soft and nontender.  Bowel sounds heard.   Extremities: No cyanosis; bilateral lower extremity trace edema    Data Reviewed: I have personally reviewed following labs and imaging studies  CBC: Recent Labs  Lab 01/01/20 0330 01/02/20 0847 01/03/20 0230 01/04/20 0434 01/05/20 0558  WBC 7.7 8.4 5.2 5.3 5.6  HGB 8.7* 8.9* 8.0* 8.1* 7.6*  HCT 27.1* 28.6* 24.8* 26.1* 24.8*  MCV 105.4* 106.7* 103.3* 107.4* 106.9*  PLT 149* 143* 119* 115* 169*   Basic Metabolic Panel: Recent Labs  Lab 12/31/19 0356 12/31/19 0356 01/02/20 0847 01/03/20 0230 01/03/20 1508 01/04/20 0434 01/05/20 0558  NA 136   < > 135 133* 136 139 139  K 4.7   < > 5.2* 3.6 4.5 4.3 4.8  CL 97*   < > 94* 95* 96* 100 99  CO2 26   < > 27 29 27 28 29     GLUCOSE 99   < > 93 91 153* 93 101*  BUN 58*   < > 71* 26* 39* 26* 47*  CREATININE 8.39*   < > 9.44* 4.89* 6.83* 4.93* 7.21*  CALCIUM 8.3*   < > 8.6* 7.3* 7.8* 8.2* 8.7*  PHOS 3.8  --   --   --  2.1*  --   --    < > = values in this interval not displayed.   GFR: Estimated Creatinine Clearance: 9.8 mL/min (A) (by C-G formula based on SCr of 7.21 mg/dL (H)). Liver Function Tests: Recent Labs  Lab 12/31/19 0356 01/03/20 1508  ALBUMIN 2.3* 2.2*   No results for input(s): LIPASE, AMYLASE in the last 168 hours. No results for input(s): AMMONIA in the last 168 hours. Coagulation Profile: Recent Labs  Lab 01/01/20 0330 01/02/20 0847 01/03/20 0230 01/04/20 0434 01/05/20 0558  INR 2.5* 2.8* 3.0* 3.1* 2.7*   Cardiac Enzymes: No results for input(s): CKTOTAL, CKMB, CKMBINDEX, TROPONINI in the last 168 hours. BNP (last 3 results) No results for input(s): PROBNP in the last 8760 hours. HbA1C: No results for input(s): HGBA1C in the last 72 hours. CBG: Recent Labs  Lab 01/03/20 2056 01/04/20 0801 01/04/20 1227 01/04/20 1746 01/04/20 2156  GLUCAP 109* 92 110* 102* 125*   Lipid Profile: No results for input(s): CHOL, HDL, LDLCALC, TRIG, CHOLHDL, LDLDIRECT in the last 72 hours. Thyroid Function Tests: No results for input(s): TSH, T4TOTAL,  FREET4, T3FREE, THYROIDAB in the last 72 hours. Anemia Panel: No results for input(s): VITAMINB12, FOLATE, FERRITIN, TIBC, IRON, RETICCTPCT in the last 72 hours. Sepsis Labs: No results for input(s): PROCALCITON, LATICACIDVEN in the last 168 hours.  No results found for this or any previous visit (from the past 240 hour(s)).       Radiology Studies: No results found.      Scheduled Meds: . allopurinol  100 mg Oral Once per day on Mon Wed Fri  . amiodarone  200 mg Oral Daily  . vitamin C  500 mg Oral Daily  . bictegravir-emtricitabine-tenofovir AF  1 tablet Oral Daily  . brimonidine  1 drop Left Eye BID  . calcitRIOL  0.5 mcg  Oral Q M,W,F-HD  . Chlorhexidine Gluconate Cloth  6 each Topical Q0600  . darbepoetin (ARANESP) injection - DIALYSIS  200 mcg Intravenous Q Fri-HD  . dorzolamide-timolol  1 drop Left Eye BID  . famotidine  10 mg Oral Daily  . feeding supplement (ENSURE ENLIVE)  237 mL Oral TID BM  . feeding supplement (PRO-STAT SUGAR FREE 64)  30 mL Oral BID  . heparin  2,000 Units Dialysis Once in dialysis  . heparin  2,000 Units Dialysis Once in dialysis  . insulin aspart  0-9 Units Subcutaneous TID WC  . latanoprost  1 drop Left Eye QHS  . midodrine  10 mg Oral Q M,W,F-HD  . montelukast  10 mg Oral QHS  . multivitamin  1 tablet Oral QHS  . psyllium  1 packet Oral Daily  . sevelamer carbonate  2,400 mg Oral TID WC  . warfarin  5 mg Oral q1800  . Warfarin - Pharmacist Dosing Inpatient   Does not apply q1800  . zinc sulfate  220 mg Oral Daily   Continuous Infusions:        Aline August, MD [facility] 01/05/2020, 7:52 AM

## 2020-01-05 NOTE — Progress Notes (Signed)
ANTICOAGULATION CONSULT NOTE - Follow Up Consult  Pharmacy Consult for Coumadin Indication: atrial fibrillation  Allergies  Allergen Reactions  . Dextromethorphan-Guaifenesin     UNKNOWN   . Tocotrienols     UNKNOWN   . Losartan Potassium Other (See Comments)    Causes constipation    Patient Measurements: Height: 6\' 2"  (188 cm) Weight: 177 lb 14.6 oz (80.7 kg) IBW/kg (Calculated) : 82.2  Vital Signs: Temp: 98.2 F (36.8 C) (02/01 0310) Temp Source: Oral (02/01 0310) BP: 122/63 (02/01 0310) Pulse Rate: 90 (02/01 0310)  Labs: Recent Labs    01/03/20 0230 01/03/20 0230 01/03/20 1508 01/04/20 0434 01/05/20 0558  HGB 8.0*   < >  --  8.1* 7.6*  HCT 24.8*  --   --  26.1* 24.8*  PLT 119*  --   --  115* 116*  LABPROT 31.5*  --   --  31.7* 28.4*  INR 3.0*  --   --  3.1* 2.7*  CREATININE 4.89*   < > 6.83* 4.93* 7.21*   < > = values in this interval not displayed.    Estimated Creatinine Clearance: 9.8 mL/min (A) (by C-G formula based on SCr of 7.21 mg/dL (H)).  Assessment:  Anticoag: Warfarin for hx afib (CHADS2VASc = 8)- d-dimer 5.26 (last 1/23). Warfarin PTA 2.5mg  MF, 5mg  AOD (INR 1.2 on admit); on amiodarone also PTA.   INR 2.7 today; Currently at goal (2-3).   Will give a dose of 5 mg today, and continue to monitior. No signs or symptoms of bleeding noted. Patient is also on amiodarone which can impact INR levels.   Goal of Therapy:  INR 2-3 Monitor platelets by anticoagulation protocol: Yes   Plan:  Give Warfarin 5mg  today, Daily INR / CBC    Jack Bolio A. Levada Dy, PharmD, BCPS, FNKF Clinical Pharmacist [place] Please utilize Amion for appropriate phone number to reach the unit pharmacist ([place])

## 2020-01-05 NOTE — Progress Notes (Signed)
On call nurse non proficient with blood administration. Dr. Carolin Sicks telephoned and made aware orders to transfuse 1 unit prbc on pt. Unit. Primary nurse Netta Neat RN made aware . Orders modified

## 2020-01-05 NOTE — Progress Notes (Signed)
[facility] Progress Note   Dialysis Orders: [facility] (MWF here) 4h 400/1.5 83.5kg 2/2 bath P4 R TDC/ new R thigh AVG Hep 2000 - Mircera 5mcg IV q 2 weeks (last 12/06/19) - Calcitriol 0.52mcg PO q HD  Assessment/Plan: 1. COVID - s/p IV dexamethasone and remdesivir - off isolation 1/28; breathing easily on room air. 2. ESRD -MTTS doing MWFS here- running 3.5 MWF and 2 hr Sat - extra time PTA was due to high IDGW; using right thigh AVG since 1/27 - doing well - should be able to get [facility] out this week though need to be mindful his is on coumadin --K 4.5 Saturday - ran on 2 K bath - K 4.8 today  3. Anemia - 1/p 1 unit PRBC 1/15 - Aranesp 200 q Friday hgb gradually declining down to 8.1 > 7.6 - given cardiac hx don't want any lower and thus will plan 1u pRBC today with HD - tsat 21% 1/21 ferritin 874 - given 510 mg Feraheme by primary 1/21 4. Secondary hyperparathyroidism - velphoro upset stomach so was  Changed to higher renvela - P 2.1 - decreased renvela to 2 ac and check tomorrow am  - on VDRA  5. Chronic hypotension/volume - on midodrine - kept even  Sat. Pre weight 80.7 - likely will have a lower edw at d/c - need standing wts and orthostatics pre HD Monday 6. Nutrition - alb 2.3 - has ensure ordered - add prostat  7. Combined CHF EF 20% - 4 x a week HD - lowest edw here 78.5 - lower at d/c--net UF Wed 753, actually + 281 Friday - intake in controlled environment is much less than when at home 8. Debility 9. Hx CVA 10. Afib - on amio/coumadin - INR 2.7  11. HIV - on medication 12. Disp - SNF recommended pending  Jannifer Hick MD [facility]   Subjective:   No c/o.  Unsure on d/c planning.   Objective Vitals:   01/04/20 1520 01/04/20 2205 01/05/20 0310 01/05/20 0500  BP: (!) 109/54 101/62 122/63   Pulse: 81 85 90   Resp: 17 18 18    Temp: (!) 97.3 F (36.3 C) 98.9 F (37.2 C) 98.2 F (36.8 C)   TempSrc: Oral Oral Oral   SpO2: 100% 100% 100%    Weight:    80.7 kg  Height:       Physical Exam General: NAD breathing easily room air  Heart: RRR Lungs: no rales Abdomen: soft NT ND  Extremities: no LE edema Dialysis Access:  Right IJ TDC and right thigh AVGG + bruit   Additional Objective Labs: Lab Results  Component Value Date   INR 2.7 (H) 01/05/2020   INR 3.1 (H) 01/04/2020   INR 3.0 (H) 43/15/4008    Basic Metabolic Panel: Recent Labs  Lab 12/31/19 0356 01/02/20 0847 01/03/20 1508 01/04/20 0434 01/05/20 0558  NA 136   < > 136 139 139  K 4.7   < > 4.5 4.3 4.8  CL 97*   < > 96* 100 99  CO2 26   < > 27 28 29   GLUCOSE 99   < > 153* 93 101*  BUN 58*   < > 39* 26* 47*  CREATININE 8.39*   < > 6.83* 4.93* 7.21*  CALCIUM 8.3*   < > 7.8* 8.2* 8.7*  PHOS 3.8  --  2.1*  --   --    < > = values in this interval  not displayed.   Liver Function Tests: Recent Labs  Lab 12/31/19 0356 01/03/20 1508  ALBUMIN 2.3* 2.2*   No results for input(s): LIPASE, AMYLASE in the last 168 hours. CBC: Recent Labs  Lab 01/01/20 0330 01/01/20 0330 01/02/20 0847 01/02/20 0847 01/03/20 0230 01/04/20 0434 01/05/20 0558  WBC 7.7   < > 8.4   < > 5.2 5.3 5.6  HGB 8.7*   < > 8.9*   < > 8.0* 8.1* 7.6*  HCT 27.1*   < > 28.6*   < > 24.8* 26.1* 24.8*  MCV 105.4*  --  106.7*  --  103.3* 107.4* 106.9*  PLT 149*   < > 143*   < > 119* 115* 116*   < > = values in this interval not displayed.   Blood Culture    Component Value Date/Time   SDES BLOOD RIGHT HAND 12/11/2019 2100   SPECREQUEST  12/11/2019 2100    BOTTLES DRAWN AEROBIC ONLY Blood Culture results may not be optimal due to an inadequate volume of blood received in culture bottles   CULT  12/11/2019 2100    NO GROWTH 5 DAYS Performed at [facility] [address] [place], [place] [address]    REPTSTATUS 12/16/2019 FINAL 12/11/2019 2100    Cardiac Enzymes: No results for input(s): CKTOTAL, CKMB, CKMBINDEX, TROPONINI in the last 168 hours. CBG: Recent Labs   Lab 01/04/20 0801 01/04/20 1227 01/04/20 1746 01/04/20 2156 01/05/20 0747  GLUCAP 92 110* 102* 125* 92   Iron Studies: No results for input(s): IRON, TIBC, TRANSFERRIN, FERRITIN in the last 72 hours. Lab Results  Component Value Date   INR 2.7 (H) 01/05/2020   INR 3.1 (H) 01/04/2020   INR 3.0 (H) 01/03/2020   Studies/Results: No results found. Medications:  . allopurinol  100 mg Oral Once per day on Mon Wed Fri  . amiodarone  200 mg Oral Daily  . vitamin C  500 mg Oral Daily  . bictegravir-emtricitabine-tenofovir AF  1 tablet Oral Daily  . brimonidine  1 drop Left Eye BID  . calcitRIOL  0.5 mcg Oral Q M,W,F-HD  . Chlorhexidine Gluconate Cloth  6 each Topical Q0600  . darbepoetin (ARANESP) injection - DIALYSIS  200 mcg Intravenous Q Fri-HD  . dorzolamide-timolol  1 drop Left Eye BID  . famotidine  10 mg Oral Daily  . feeding supplement (ENSURE ENLIVE)  237 mL Oral TID BM  . feeding supplement (PRO-STAT SUGAR FREE 64)  30 mL Oral BID  . heparin  2,000 Units Dialysis Once in dialysis  . heparin  2,000 Units Dialysis Once in dialysis  . insulin aspart  0-9 Units Subcutaneous TID WC  . latanoprost  1 drop Left Eye QHS  . midodrine  10 mg Oral Q M,W,F-HD  . montelukast  10 mg Oral QHS  . multivitamin  1 tablet Oral QHS  . psyllium  1 packet Oral Daily  . sevelamer carbonate  2,400 mg Oral TID WC  . warfarin  5 mg Oral q1800  . warfarin  5 mg Oral ONCE-1800  . Warfarin - Pharmacist Dosing Inpatient   Does not apply q1800  . zinc sulfate  220 mg Oral Daily

## 2020-01-06 LAB — CBC
HCT: 27.3 % — ABNORMAL LOW (ref 39.0–52.0)
Hemoglobin: 8.6 g/dL — ABNORMAL LOW (ref 13.0–17.0)
MCH: 32.7 pg (ref 26.0–34.0)
MCHC: 31.5 g/dL (ref 30.0–36.0)
MCV: 103.8 fL — ABNORMAL HIGH (ref 80.0–100.0)
Platelets: 122 K/uL — ABNORMAL LOW (ref 150–400)
RBC: 2.63 MIL/uL — ABNORMAL LOW (ref 4.22–5.81)
RDW: 20.9 % — ABNORMAL HIGH (ref 11.5–15.5)
WBC: 4.9 K/uL (ref 4.0–10.5)
nRBC: 0 % (ref 0.0–0.2)

## 2020-01-06 LAB — TYPE AND SCREEN
ABO/RH(D): O POS
Antibody Screen: NEGATIVE
Unit division: 0

## 2020-01-06 LAB — GLUCOSE, CAPILLARY
Glucose-Capillary: 87 mg/dL (ref 70–99)
Glucose-Capillary: 99 mg/dL (ref 70–99)
Glucose-Capillary: 106 mg/dL — ABNORMAL HIGH (ref 70–99)
Glucose-Capillary: 114 mg/dL — ABNORMAL HIGH (ref 70–99)

## 2020-01-06 LAB — BASIC METABOLIC PANEL
Anion gap: 12 (ref 5–15)
BUN: 26 mg/dL — ABNORMAL HIGH (ref 8–23)
CO2: 27 mmol/L (ref 22–32)
Calcium: 8.2 mg/dL — ABNORMAL LOW (ref 8.9–10.3)
Chloride: 100 mmol/L (ref 98–111)
Creatinine, Ser: 5.31 mg/dL — ABNORMAL HIGH (ref 0.61–1.24)
GFR calc Af Amer: 11 mL/min — ABNORMAL LOW (ref >60)
GFR calc non Af Amer: 10 mL/min — ABNORMAL LOW (ref >60)
Glucose, Bld: 87 mg/dL (ref 70–99)
Potassium: 3.9 mmol/L (ref 3.5–5.1)
Sodium: 139 mmol/L (ref 135–145)

## 2020-01-06 LAB — BPAM RBC
Blood Product Expiration Date: 202102262359
ISSUE DATE / TIME: 202102011934
Unit Type and Rh: 5100

## 2020-01-06 LAB — PROTIME-INR
INR: 2.4 — ABNORMAL HIGH (ref 0.8–1.2)
Prothrombin Time: 26.4 s — ABNORMAL HIGH (ref 11.4–15.2)

## 2020-01-06 MED ORDER — WARFARIN SODIUM 5 MG PO TABS
5.0000 mg | ORAL_TABLET | Freq: Once | ORAL | Status: AC
  Administered 2020-01-06: 5 mg via ORAL
  Filled 2020-01-06: qty 1

## 2020-01-06 MED ORDER — ENSURE ENLIVE PO LIQD
237.0000 mL | Freq: Two times a day (BID) | ORAL | Status: DC
  Administered 2020-01-08 – 2020-01-09 (×3): 237 mL via ORAL

## 2020-01-06 NOTE — Progress Notes (Signed)
Nutrition Follow-up  DOCUMENTATION CODES:   Not applicable  INTERVENTION:   -D/c Prostat -Decrease Ensure Enlive po to BID, each supplement provides 350 kcal and 20 grams of protein -Continue renal MVI daily  NUTRITION DIAGNOSIS:   Inadequate oral intake related to poor appetite, vomiting as evidenced by meal completion < 25%.  Progressing; now consuming 75-100% of meals and drinking Ensure supplements  GOAL:   Patient will meet greater than or equal to 90% of their needs  Progressing   MONITOR:   PO intake, Supplement acceptance, Labs, Weight trends, I & O's  REASON FOR ASSESSMENT:   Malnutrition Screening Tool    ASSESSMENT:   79 year old male who presented to the ED on 1/07 with generalized weakness and tremors. PMH of ESRD on HD (MTTS), CHF, anemia, atrial fibrillation, PEA arrest in March 2019, GERD, HIV, prostate cancer, CVA, HTN. Pt tested positive for COVID-19.  1/28- completed COVID isolation  Reviewed I/O's: +665 ml x 24 hours and +1.2 L since 12/23/19  UOP: 50 ml x 24 hours  Pt sitting in recliner chair, smiling at time of visit. Pt reports feeling well and is pleasant and in good spirits today. He reports he has a great appetite; observed lunch tray- pt consumed about 50% of meat off the tray- pt reported "I would have eaten more, but I didn't like those collard greens". Pt shares he consumed all of his breakfast this AM. Documented meal intake 75-100%.   Pt reports muscle loss "because I feel weak". Pt shares he has been making good progress with physical therapy this AM. Discussed importance of good meal and supplement intake to promote healing.   Case discussed with RN, who confirms that pt with good appetite and taking medications well. Obtain strawberry Ensure supplement, which is pt's preferred flavor.   Pt awaiting on SNF placement for discharge.   Labs reviewed: CBGS: 87-136 (inpatient orders for glycemic control are 0-9 units insulin aspart TID  with meals).   NUTRITION - FOCUSED PHYSICAL EXAM:    Most Recent Value  Orbital Region  No depletion  Upper Arm Region  No depletion  Thoracic and Lumbar Region  No depletion  Buccal Region  No depletion  Temple Region  No depletion  Clavicle Bone Region  No depletion  Clavicle and Acromion Bone Region  No depletion  Scapular Bone Region  No depletion  Dorsal Hand  No depletion  Patellar Region  Mild depletion  Anterior Thigh Region  Mild depletion  Posterior Calf Region  Mild depletion  Edema (RD Assessment)  None  Hair  Reviewed  Eyes  Reviewed  Mouth  Reviewed  Skin  Reviewed  Nails  Reviewed       Diet Order:   Diet Order            Diet renal/carb modified with fluid restriction Diet-HS Snack? Nothing; Fluid restriction: 1200 mL Fluid; Room service appropriate? Yes; Fluid consistency: Thin  Diet effective now              EDUCATION NEEDS:   No education needs have been identified at this time  Skin:  Skin Assessment: Skin Integrity Issues: Skin Integrity Issues:: Stage II Stage II: scrotum, rt buttocks Incisions: right thigh  Last BM:  01/05/20  Height:   Ht Readings from Last 1 Encounters:  12/12/19 6\' 2"  (1.88 m)    Weight:   Wt Readings from Last 1 Encounters:  01/05/20 82.5 kg    Ideal Body Weight:  86.4 kg  BMI:  Body mass index is 23.35 kg/m.  Estimated Nutritional Needs:   Kcal:  2100-2300  Protein:  105-120 grams  Fluid:  1000 ml + UOP    Caydence Koenig A. Jimmye Norman, RD, LDN, [place] Registered Dietitian II Certified Diabetes Care and Education Specialist Pager: [phone] After hours Pager: [phone]

## 2020-01-06 NOTE — Progress Notes (Signed)
Patient ID: David [name], male   DOB: 05/24/1941, 79 y.o.   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 During the hospitalization, patient was not started on any treatment for Covid as he was not hypoxic however, subsequently his respiratory rate increased and CRP was 22.9 and was subsequently started on dexamethasone and remdesivir on 12/12/2019 He was found to have elevated troponin: Cardiology was consulted.  Cardiology deemed the patient not a candidate for cardiac intervention and troponin elevation was secondary to demand ischemia and no therapies recommended. Nephrology was consulted as well. Palliative Care was consulted and subsequently patient was made a DNR. Hospital course complicated by ongoing failure to thrive, poor p.o. intake, palliative care conversations ongoing; finally with improving oral intake and now plan for SNF placement with palliative care follow-up. He is medically stable for discharge to SNF, awaiting bed.  Completed Covid isolation on 01/01/2020.  Assessment & Plan:   COVID-19 virus infection -Tested positive on 12/11/2019 -Has completed 5 days of remdesivir, 4 days of dexamethasone which was stopped on 12/15/2019 due to uremia (BUN to 99) -Respiratory status has improved and stable.  Currently on room air -Remains debilitated and frail.  PT evaluation recommended SNF placement.   Social worker following. -Completed Covid isolation on 01/01/2020 -Currently afebrile and hemodynamically stable.  Nausea and vomiting -Most likely from Covid infection and uremia.  Resolved  Elevated troponin in a patient with extensive cardiac history End-stage heart failure, systolic and diastolic combined CHF History of PEA arrest -EF of 20% -Troponin mildly elevated during admission but did not trend up.  EKG unchanged with baseline LBBB -Cardiology consulted-no indication for cardiac intervention due to end-stage heart failure and recommending optimizing volume status with hemodialysis -Palliative care consulted, as above goals of care conversation had and he is now DNR -Poor prognosis  End-stage renal disease on hemodialysis Hyperkalemia: Resolved -Nephrology following.  Dialysis as per nephrology schedule.  Continue midodrine on dialysis days.  Chronic hypotension -Continue midodrine on dialysis days  Anemia of chronic disease/microcytic anemia -Hemoglobin stable.  Monitor intermittently.  Patient received a dose of packed red cells on 01/05/2020 as per nephrology recommendations.  Thrombocytopenia -Questionable cause.  Monitor intermittently.  No signs of bleeding  Paroxysmal A. fib  -on Coumadin and amiodarone.  Currently rate controlled.  Monitor INR.  Chronic pain -Continue with tramadol  History of HIV -Stable.  Continue [name].  Last CD4 count was 413 in 09/2019 with undetectable viral load  Prediabetes -Continue SSI  Stage II pressure injury right buttock: Not present on admission Stage II pressure injury on scrotum: Not present on admission -Continue wound care   Ambulatory dysfunction Generalized deconditioning -PT/OT recommending SNF.  Social worker following.  Palliative care has evaluated the patient and patient is currently DNR.  Palliative care recommending palliative care follow-up as an outpatient.  DVT prophylaxis: Coumadin Code Status:  DNR Family Communication: None at bedside Disposition Plan: SNF once bed is available.  Currently medically stable for discharge.  Consultants: Cardiology/nephrology/palliative care  Procedures: None  Antimicrobials: Has already completed remdesivir treatment   Subjective: Patient seen and examined at bedside.  Poor historian.  Denies fever, nausea or vomiting.  No worsening shortness of breath.    Objective: Vitals:   01/06/20 0200 01/06/20 0230 01/06/20 0313 01/06/20 0612  BP: 121/64 105/66 119/64 133/65  Pulse: 76 78 83 87  Resp: 16 16 18 18   Temp:  97.6 F (36.4 C)  98.5 F (36.9 C)  TempSrc:  Oral  Oral  SpO2:   100% 99%  Weight:      Height:        Intake/Output Summary (Last 24 hours) at 01/06/2020 0801 Last data filed at 01/06/2020 0230 Gross per 24 hour  Intake 775 ml  Output 550 ml  Net 225 ml   Filed Weights   01/04/20 0500 01/05/20 0500 01/05/20 2345  Weight: 80.7 kg 80.7 kg 82.5 kg    Examination:  General exam: No distress.  Looks chronically ill.  Elderly male lying in bed.  Poor historian. Respiratory system: Bilateral decreased breath sounds at bases with some scattered crackles.   Cardiovascular system: Regular, S1-S2 heard Gastrointestinal system: Abdomen is nondistended, soft and nontender.  Bowel sounds heard.   Extremities: No cyanosis; bilateral lower extremity trace edema    Data Reviewed: I have personally reviewed following labs and imaging studies  CBC: Recent Labs  Lab 01/01/20 0330 01/02/20 0847 01/03/20 0230 01/04/20 0434 01/05/20 0558  WBC 7.7 8.4 5.2 5.3 5.6  HGB 8.7* 8.9* 8.0* 8.1* 7.6*  HCT 27.1* 28.6* 24.8* 26.1* 24.8*  MCV 105.4* 106.7* 103.3* 107.4* 106.9*  PLT 149* 143* 119* 115* 938*   Basic Metabolic Panel: Recent Labs  Lab 12/31/19 0356 12/31/19 0356 01/02/20 0847 01/03/20 0230 01/03/20 1508 01/04/20 0434 01/05/20 0558  NA 136   < > 135 133* 136 139 139  K 4.7   < > 5.2* 3.6 4.5 4.3 4.8  CL 97*   < >  94* 95* 96* 100 99  CO2 26   < > 27 29 27 28 29   GLUCOSE 99   < > 93 91 153* 93 101*  BUN 58*   < > 71* 26* 39* 26* 47*  CREATININE 8.39*   < > 9.44* 4.89* 6.83* 4.93* 7.21*  CALCIUM 8.3*   < > 8.6* 7.3* 7.8* 8.2* 8.7*  PHOS 3.8  --   --   --  2.1*  --   --    < > = values in this interval not displayed.   GFR: Estimated Creatinine Clearance: 10 mL/min (A) (by C-G formula based on SCr of 7.21 mg/dL (H)). Liver Function Tests: Recent Labs  Lab 12/31/19 0356 01/03/20 1508  ALBUMIN 2.3* 2.2*   No results for input(s): LIPASE, AMYLASE in the last 168 hours. No results for input(s): AMMONIA in the last 168 hours. Coagulation Profile: Recent Labs  Lab 01/01/20 0330 01/02/20 0847 01/03/20 0230 01/04/20 0434 01/05/20 0558  INR 2.5* 2.8* 3.0* 3.1* 2.7*   Cardiac Enzymes: No results for input(s): CKTOTAL, CKMB, CKMBINDEX, TROPONINI in the last 168 hours. BNP (last 3 results) No results for input(s): PROBNP in the last 8760 hours. HbA1C: No results for input(s): HGBA1C in the last 72 hours. CBG: Recent Labs  Lab 01/05/20 0747 01/05/20 1119 01/05/20 1655 01/05/20 1928 01/06/20 0645  GLUCAP 92 136* 105* 90 87   Lipid Profile: No results for input(s): CHOL, HDL, LDLCALC, TRIG, CHOLHDL, LDLDIRECT in the last 72 hours.  Thyroid Function Tests: No results for input(s): TSH, T4TOTAL, FREET4, T3FREE, THYROIDAB in the last 72 hours. Anemia Panel: No results for input(s): VITAMINB12, FOLATE, FERRITIN, TIBC, IRON, RETICCTPCT in the last 72 hours. Sepsis Labs: No results for input(s): PROCALCITON, LATICACIDVEN in the last 168 hours.  No results found for this or any previous visit (from the past 240 hour(s)).       Radiology Studies: No results found.      Scheduled Meds: . allopurinol  100 mg Oral Once per day on Mon Wed Fri  . amiodarone  200 mg Oral Daily  . vitamin C  500 mg Oral Daily  . bictegravir-emtricitabine-tenofovir AF  1 tablet Oral Daily  . brimonidine   1 drop Left Eye BID  . calcitRIOL  0.5 mcg Oral Q M,W,F-HD  . Chlorhexidine Gluconate Cloth  6 each Topical Q0600  . darbepoetin (ARANESP) injection - DIALYSIS  200 mcg Intravenous Q Mon-HD  . dorzolamide-timolol  1 drop Left Eye BID  . famotidine  10 mg Oral Daily  . feeding supplement (ENSURE ENLIVE)  237 mL Oral TID BM  . feeding supplement (PRO-STAT SUGAR FREE 64)  30 mL Oral BID  . heparin  2,000 Units Dialysis Once in dialysis  . heparin  2,000 Units Dialysis Once in dialysis  . insulin aspart  0-9 Units Subcutaneous TID WC  . latanoprost  1 drop Left Eye QHS  . midodrine  10 mg Oral Q M,W,F-HD  . montelukast  10 mg Oral QHS  . multivitamin  1 tablet Oral QHS  . psyllium  1 packet Oral Daily  . sevelamer carbonate  2,400 mg Oral TID WC  . Warfarin - Pharmacist Dosing Inpatient   Does not apply q1800  . zinc sulfate  220 mg Oral Daily   Continuous Infusions:        Aline August, MD [facility] 01/06/2020, 8:01 AM

## 2020-01-06 NOTE — Progress Notes (Signed)
[facility] Progress Note   Dialysis Orders: [facility] (MWF here) 4h 400/1.5 83.5kg 2/2 bath P4 R TDC/ new R thigh AVG Hep 2000 - Mircera 33mcg IV q 2 weeks (last 12/06/19) - Calcitriol 0.84mcg PO q HD  Assessment/Plan: 1. COVID - s/p IV dexamethasone and remdesivir - off isolation 1/28; breathing easily on room air. 2. ESRD -MTTS doing MWFS here- running 3.5 MWF and 2 hr Sat - extra time PTA was due to high IDGW; using right thigh AVG since 1/27 - doing well - should be able to get [facility] out this week though need to be mindful his is on coumadin --K 4.5 Saturday - ran on 2 K bath - K 3.9 today, will plan for 3K tomorrow  3. Anemia - 1/p 1 unit PRBC 1/15 - Aranesp 200 q Friday hgb gradually declining down to 8.1 > 7.6 > 1u pRBC 2/1 > 8.6.  tsat 21% 1/21 ferritin 874 - given 510 mg Feraheme by primary 1/21 4. Secondary hyperparathyroidism - velphoro upset stomach so was  Changed to higher renvela - P 2.1 - decreased renvela to 2 ac and check tomorrow am  - on VDRA  5. Chronic hypotension/volume - on midodrine - kept even  Sat. Pre weight 80.7 - likely will have a lower edw at d/c - need standing wts and orthostatics pre HD Monday 6. 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 8. Debility 9. Hx CVA 10. Afib - on amio/coumadin - INR 2.4  11. HIV - on medication 12. Disp - SNF recommended, placement pending  Jannifer Hick MD [facility]   Subjective:   No c/o except tired from late night dialysis.   Objective Vitals:   01/06/20 0200 01/06/20 0230 01/06/20 0313 01/06/20 0612  BP: 121/64 105/66 119/64 133/65  Pulse: 76 78 83 87  Resp: 16 16 18 18   Temp:  97.6 F (36.4 C)  98.5 F (36.9 C)  TempSrc:  Oral  Oral  SpO2:   100% 99%  Weight:      Height:       Physical Exam General: NAD breathing easily room air  Heart: RRR Lungs:  no rales Abdomen: soft NT ND  Extremities: no LE edema Dialysis Access:  Right IJ TDC and right thigh AVGG + bruit   Additional Objective Labs: Lab Results  Component Value Date   INR 2.4 (H) 01/06/2020   INR 2.7 (H) 01/05/2020   INR 3.1 (H) 64/40/3474    Basic Metabolic Panel: Recent Labs  Lab 12/31/19 0356 01/02/20 0847 01/03/20 1508 01/03/20 1508 01/04/20 0434 01/05/20 0558 01/06/20 0702  NA 136   < > 136   < > 139 139 139  K 4.7   < > 4.5   < > 4.3 4.8 3.9  CL 97*   < > 96*   < > 100 99 100  CO2 26   < > 27   < > 28 29 27   GLUCOSE 99   < > 153*   < > 93 101* 87  BUN 58*   < > 39*   < > 26* 47* 26*  CREATININE 8.39*   < > 6.83*   < > 4.93* 7.21* 5.31*  CALCIUM 8.3*   < > 7.8*   < > 8.2* 8.7* 8.2*  PHOS 3.8  --  2.1*  --   --   --   --    < > = values in this interval not displayed.   Liver Function Tests: Recent Labs  Lab 12/31/19 0356 01/03/20 1508  ALBUMIN 2.3* 2.2*   No results for input(s): LIPASE, AMYLASE in the last 168 hours. CBC: Recent Labs  Lab 01/02/20 0847 01/02/20 0847 01/03/20 0230 01/03/20 0230 01/04/20 0434 01/05/20 0558 01/06/20 0702  WBC 8.4   < > 5.2   < > 5.3 5.6 4.9  HGB 8.9*   < > 8.0*   < > 8.1* 7.6* 8.6*  HCT 28.6*   < > 24.8*   < > 26.1* 24.8* 27.3*  MCV 106.7*  --  103.3*  --  107.4* 106.9* 103.8*  PLT 143*   < > 119*   < > 115* 116* 122*   < > = values in this interval not displayed.   Blood Culture    Component Value Date/Time   SDES BLOOD RIGHT HAND 12/11/2019 2100   SPECREQUEST  12/11/2019 2100    BOTTLES DRAWN AEROBIC ONLY Blood Culture results may not be optimal due to an inadequate volume of blood received in culture bottles   CULT  12/11/2019 2100    NO GROWTH 5 DAYS Performed at [facility] [address], [place], [place] [address]    REPTSTATUS 12/16/2019 FINAL 12/11/2019 2100    Cardiac Enzymes: No results for input(s): CKTOTAL, CKMB, CKMBINDEX, TROPONINI in the last 168 hours. CBG: Recent  Labs  Lab 01/05/20 0747 01/05/20 1119 01/05/20 1655 01/05/20 1928 01/06/20 0645  GLUCAP 92 136* 105* 90 87   Iron Studies: No results for input(s): IRON, TIBC, TRANSFERRIN, FERRITIN in the last 72 hours. Lab Results  Component Value Date   INR 2.4 (H) 01/06/2020   INR 2.7 (H) 01/05/2020   INR 3.1 (H) 01/04/2020   Studies/Results: No results found. Medications:  . allopurinol  100 mg Oral Once per day on Mon Wed Fri  . amiodarone  200 mg Oral Daily  . vitamin C  500 mg Oral Daily  . bictegravir-emtricitabine-tenofovir AF  1 tablet Oral Daily  . brimonidine  1 drop Left Eye BID  . calcitRIOL  0.5 mcg Oral Q M,W,F-HD  . Chlorhexidine Gluconate Cloth  6 each Topical Q0600  . darbepoetin (ARANESP) injection - DIALYSIS  200 mcg Intravenous Q Mon-HD  . dorzolamide-timolol  1 drop Left Eye BID  . famotidine  10 mg Oral Daily  . feeding supplement (ENSURE ENLIVE)  237 mL Oral TID BM  . feeding supplement (PRO-STAT SUGAR FREE 64)  30 mL Oral BID  . heparin  2,000 Units Dialysis Once in dialysis  . heparin  2,000 Units Dialysis Once in dialysis  . insulin aspart  0-9 Units Subcutaneous TID WC  . latanoprost  1 drop Left Eye QHS  . midodrine  10 mg Oral Q M,W,F-HD  . montelukast  10 mg Oral QHS  . multivitamin  1 tablet Oral QHS  . psyllium  1 packet Oral Daily  . sevelamer carbonate  2,400 mg Oral TID WC  . warfarin  5 mg Oral ONCE-1800  . Warfarin - Pharmacist Dosing Inpatient   Does not apply q1800  . zinc sulfate  220 mg Oral Daily

## 2020-01-06 NOTE — Progress Notes (Signed)
Occupational Therapy Treatment Patient Details Name: David [name] MRN: [id number] DOB: 02-13-1941 Today's Date: 01/06/2020    History of present illness Patient is a 79 year old male with history of combined systolic and diastolic heart failure (EF 20 to 25% 03/04/2019), iron deficiency anemia, atrial fibrillation on Coumadin only tolerates amiodarone, PEA arrest 02/2018, ESRD on HD MTTS, GERD, HIV, prostate cancer, CVA, hypertension who presented to the ED on 1/7 with complaints of generalized weakness and tremors and not feeling well x5 days.  Found to be Covid +.   OT comments  Patient pleasant and motivated to get out of bed despite feeling tired. Patient with decreased problem solving, following commands requiring increased time and multimodal cues to perform bed mobility and functional transfers. Patient require x3 attempts to stand from edge of bed (elevated) and mod A to boost up to standing. Decreased safety with body mechanics using walker to transfer to bedside chair with narrow base of support and keeping walker too far forward when backing up. Once seated patient participate in seated grooming/hygiene task. Patient progressing towards acute OT goals, will continue to follow.   Follow Up Recommendations  SNF;Supervision/Assistance - 24 hour    Equipment Recommendations  Other (comment)(defer to next venue)       Precautions / Restrictions Precautions Precautions: Fall       Mobility Bed Mobility Overal bed mobility: Needs Assistance Bed Mobility: Supine to Sit       Sit to supine: Mod assist   General bed mobility comments: min A for mobilizing LEs and min/mod A for trunk support with HOB elevated  Transfers Overall transfer level: Needs assistance Equipment used: Rolling walker (2 wheeled) Transfers: Sit to/from Stand Sit to Stand: Mod assist;From elevated surface         General transfer comment: x 2 attempt before successful on 3rd attempt to stand with  mod A to boost and kyphotic posture initially    Balance Overall balance assessment: Needs assistance Sitting-balance support: Bilateral upper extremity supported;Feet supported Sitting balance-Leahy Scale: Fair     Standing balance support: Bilateral upper extremity supported;During functional activity Standing balance-Leahy Scale: Poor Standing balance comment: requires use of rolling walker                           ADL either performed or assessed with clinical judgement   ADL Overall ADL's : Needs assistance/impaired     Grooming: Wash/dry hands;Wash/dry face;Oral care;Set up;Sitting                   Toilet Transfer: Moderate assistance;BSC;RW;Cueing for sequencing;Cueing for safety;Ambulation Toilet Transfer Details (indicate cue type and reason): simulated to recliner, 3 attempts to stand with mod A to power up, cues for body mechanics due to narrow base of support.         Functional mobility during ADLs: Moderate assistance;Cueing for safety;Cueing for sequencing;Rolling walker General ADL Comments: pt reports very tired today due to getting back late from dialysis and has "less strength than yesterday."                Cognition Arousal/Alertness: Awake/alert Behavior During Therapy: WFL for tasks assessed/performed Overall Cognitive Status: No family/caregiver present to determine baseline cognitive functioning Area of Impairment: Safety/judgement;Problem solving;Following commands                       Following Commands: Follows one step commands with increased time Safety/Judgement: Decreased awareness of  safety   Problem Solving: Slow processing;Requires verbal cues;Requires tactile cues General Comments: patient pleasant and cooperative, motivated to get up to chair.                   Pertinent Vitals/ Pain       Pain Assessment: No/denies pain         Frequency  Min 2X/week        Progress Toward  Goals  OT Goals(current goals can now be found in the care plan section)  Progress towards OT goals: Progressing toward goals  Acute Rehab OT Goals Patient Stated Goal: "to get my strength back" OT Goal Formulation: With patient Time For Goal Achievement: 01/20/20 Potential to Achieve Goals: Good ADL Goals Pt Will Perform Grooming: with supervision;sitting;standing Pt Will Perform Upper Body Bathing: with supervision;sitting;standing Pt Will Perform Lower Body Bathing: with min assist;sitting/lateral leans;sit to/from stand Pt Will Transfer to Toilet: with min assist;ambulating;bedside commode Pt Will Perform Toileting - Clothing Manipulation and hygiene: with min assist;sitting/lateral leans;sit to/from stand  Plan Discharge plan remains appropriate       AM-PAC OT "6 Clicks" Daily Activity     Outcome Measure   Help from another person eating meals?: None Help from another person taking care of personal grooming?: A Little Help from another person toileting, which includes using toliet, bedpan, or urinal?: A Lot Help from another person bathing (including washing, rinsing, drying)?: A Lot Help from another person to put on and taking off regular upper body clothing?: A Little Help from another person to put on and taking off regular lower body clothing?: A Lot 6 Click Score: 16    End of Session Equipment Utilized During Treatment: Gait belt;Rolling walker  OT Visit Diagnosis: Unsteadiness on feet (R26.81);Other symptoms and signs involving cognitive function;Muscle weakness (generalized) (M62.81)   Activity Tolerance Patient tolerated treatment well   Patient Left in chair;with call bell/phone within reach;with chair alarm set   Nurse Communication Mobility status        Time: 1194-1740 OT Time Calculation (min): 30 min  Charges: OT General Charges $OT Visit: 1 Visit OT Treatments $Self Care/Home Management : 23-37 mins  Shon Millet OT OT office:  [place] 01/06/2020, 12:53 PM

## 2020-01-06 NOTE — Progress Notes (Signed)
OT Cancellation Note  Patient Details Name: David [name] MRN: [id number] DOB: November 29, 1941   Cancelled Treatment:     Attempted to see patient this AM however patient reports did not get back to his room from dialysis until 0300 this morning and is very tired. Will re-attempt as time permits.   Shon Millet OT OT office: [place] 01/06/2020, 9:55 AM

## 2020-01-06 NOTE — Progress Notes (Signed)
ANTICOAGULATION CONSULT NOTE - Follow Up Consult  Pharmacy Consult for Coumadin Indication: atrial fibrillation  Allergies  Allergen Reactions  . Dextromethorphan-Guaifenesin     UNKNOWN   . Tocotrienols     UNKNOWN   . Losartan Potassium Other (See Comments)    Causes constipation    Patient Measurements: Height: 6\' 2"  (188 cm) Weight: 181 lb 14.1 oz (82.5 kg) IBW/kg (Calculated) : 82.2  Vital Signs: Temp: 98.5 F (36.9 C) (02/02 0612) Temp Source: Oral (02/02 0612) BP: 133/65 (02/02 0612) Pulse Rate: 87 (02/02 0612)  Labs: Recent Labs    01/04/20 0434 01/04/20 0434 01/05/20 0558 01/06/20 0702  HGB 8.1*   < > 7.6* 8.6*  HCT 26.1*  --  24.8* 27.3*  PLT 115*  --  116* 122*  LABPROT 31.7*  --  28.4* 26.4*  INR 3.1*  --  2.7* 2.4*  CREATININE 4.93*  --  7.21* 5.31*   < > = values in this interval not displayed.    Estimated Creatinine Clearance: 13.5 mL/min (A) (by C-G formula based on SCr of 5.31 mg/dL (H)).  Assessment:  Anticoag: Warfarin for hx afib (CHADS2VASc = 8)- d-dimer 5.26 (last 1/23). Warfarin PTA 2.5mg  MF, 5mg  AOD (INR 1.2 on admit); on amiodarone also PTA.   INR 2.4 today; Currently at goal (2-3).   Will give a dose of 5 mg today, and continue to monitior. No signs or symptoms of bleeding noted. Patient is also on amiodarone which can impact INR levels.   Goal of Therapy:  INR 2-3 Monitor platelets by anticoagulation protocol: Yes   Plan:  Give Warfarin 5mg  today, Daily INR / CBC    Calleigh Lafontant A. Levada Dy, PharmD, BCPS, FNKF Clinical Pharmacist [place] Please utilize Amion for appropriate phone number to reach the unit pharmacist ([place])

## 2020-01-06 NOTE — TOC Progression Note (Signed)
Transition of Care [facility]) - Progression Note    Patient Details  Name: David [name] MRN: [id number] Date of Birth: Feb 23, 1941  Transition of Care [facility]) CM/SW [place], [place] Phone Number: 01/06/2020, 11:36 AM  Clinical Narrative:    CSW has resent referrals to SNFs that are able to transport to [facility] dialysis center. Pt off all COVID isolation precautions. Pt still needing SNF level care per recommendations. Pt barriers remain dialysis transportation- at this time very few SNFs are able to transport to dialysis that accept [facility].    Expected Discharge Plan: [place] Barriers to Discharge: Continued Medical Work up  Expected Discharge Plan and Services Expected Discharge Plan: [place] Choice: [place] arrangements for the past 2 months: Single Family Home  Readmission Risk Interventions Readmission Risk Prevention Plan 09/02/2019  Transportation Screening Complete  Medication Review (RN Care Manager) Complete  Palliative Care Screening Not Applicable  Some recent data might be hidden

## 2020-01-07 ENCOUNTER — Other Ambulatory Visit: Payer: Medicare HMO

## 2020-01-07 ENCOUNTER — Ambulatory Visit: Payer: Medicare HMO

## 2020-01-07 DIAGNOSIS — L8990 Pressure ulcer of unspecified site, unspecified stage: Secondary | ICD-10-CM | POA: Insufficient documentation

## 2020-01-07 LAB — CBC WITH DIFFERENTIAL/PLATELET
Abs Immature Granulocytes: 0.01 10*3/uL (ref 0.00–0.07)
Basophils Absolute: 0 10*3/uL (ref 0.0–0.1)
Basophils Relative: 0 %
Eosinophils Absolute: 0.2 10*3/uL (ref 0.0–0.5)
Eosinophils Relative: 4 %
HCT: 26.8 % — ABNORMAL LOW (ref 39.0–52.0)
Hemoglobin: 8.3 g/dL — ABNORMAL LOW (ref 13.0–17.0)
Immature Granulocytes: 0 %
Lymphocytes Relative: 20 %
Lymphs Abs: 1.1 10*3/uL (ref 0.7–4.0)
MCH: 32.5 pg (ref 26.0–34.0)
MCHC: 31 g/dL (ref 30.0–36.0)
MCV: 105.1 fL — ABNORMAL HIGH (ref 80.0–100.0)
Monocytes Absolute: 0.5 10*3/uL (ref 0.1–1.0)
Monocytes Relative: 9 %
Neutro Abs: 3.6 10*3/uL (ref 1.7–7.7)
Neutrophils Relative %: 67 %
Platelets: 128 10*3/uL — ABNORMAL LOW (ref 150–400)
RBC: 2.55 MIL/uL — ABNORMAL LOW (ref 4.22–5.81)
RDW: 19.9 % — ABNORMAL HIGH (ref 11.5–15.5)
WBC: 5.5 10*3/uL (ref 4.0–10.5)
nRBC: 0 % (ref 0.0–0.2)

## 2020-01-07 LAB — PHOSPHORUS: Phosphorus: 2.2 mg/dL — ABNORMAL LOW (ref 2.5–4.6)

## 2020-01-07 LAB — BASIC METABOLIC PANEL
Anion gap: 11 (ref 5–15)
BUN: 44 mg/dL — ABNORMAL HIGH (ref 8–23)
CO2: 30 mmol/L (ref 22–32)
Calcium: 8.5 mg/dL — ABNORMAL LOW (ref 8.9–10.3)
Chloride: 100 mmol/L (ref 98–111)
Creatinine, Ser: 7.12 mg/dL — ABNORMAL HIGH (ref 0.61–1.24)
GFR calc Af Amer: 8 mL/min — ABNORMAL LOW (ref >60)
GFR calc non Af Amer: 7 mL/min — ABNORMAL LOW (ref >60)
Glucose, Bld: 91 mg/dL (ref 70–99)
Potassium: 4.4 mmol/L (ref 3.5–5.1)
Sodium: 141 mmol/L (ref 135–145)

## 2020-01-07 LAB — GLUCOSE, CAPILLARY
Glucose-Capillary: 129 mg/dL — ABNORMAL HIGH (ref 70–99)
Glucose-Capillary: 123 mg/dL — ABNORMAL HIGH (ref 70–99)
Glucose-Capillary: 132 mg/dL — ABNORMAL HIGH (ref 70–99)

## 2020-01-07 LAB — HEMOGLOBIN A1C
Hgb A1c MFr Bld: 5.4 % (ref 4.8–5.6)
Mean Plasma Glucose: 108.28 mg/dL

## 2020-01-07 LAB — PROTIME-INR
INR: 2.6 — ABNORMAL HIGH (ref 0.8–1.2)
Prothrombin Time: 27.8 seconds — ABNORMAL HIGH (ref 11.4–15.2)

## 2020-01-07 MED ORDER — WARFARIN SODIUM 5 MG PO TABS
5.0000 mg | ORAL_TABLET | Freq: Once | ORAL | Status: AC
  Administered 2020-01-07: 5 mg via ORAL
  Filled 2020-01-07 (×2): qty 1

## 2020-01-07 MED ORDER — HEPARIN SODIUM (PORCINE) 1000 UNIT/ML IJ SOLN
INTRAMUSCULAR | Status: AC
  Filled 2020-01-07: qty 4

## 2020-01-07 MED ORDER — CALCITRIOL 0.5 MCG PO CAPS
ORAL_CAPSULE | ORAL | Status: AC
  Filled 2020-01-07: qty 1

## 2020-01-07 NOTE — Progress Notes (Signed)
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  Found to be Covid +.    PT Comments    Pt was agreeable to going into the bathroom for teeth brushing and to the toilet.  Pt showed fatigue especially in the LE's and trunk, but ambulated with RW to the sink and completed a task over about 10 minutes before sitting for the first time.  Emphasis on transitions, there ex.  Sit to stand, upright stance/standing tolerance and gait training.    Follow Up Recommendations  SNF;Supervision/Assistance - 24 hour     Equipment Recommendations  Other (comment)(TBA)    Recommendations for Other Services       Precautions / Restrictions Precautions Precautions: Fall    Mobility  Bed Mobility Overal bed mobility: Needs Assistance Bed Mobility: Supine to Sit     Supine to sit: Min assist;+2 for safety/equipment     General bed mobility comments: pt bridged with mod assist, but came up with heavy minimal assist and assist placing R hand for best advantage.  Transfers Overall transfer level: Needs assistance Equipment used: Rolling walker (2 wheeled) Transfers: Sit to/from [facility] Sit to Stand: Mod assist;+2 safety/equipment;Min assist Stand pivot transfers: Mod assist;+2 safety/equipment       General transfer comment: cues for hand placement and   Ambulation/Gait Ambulation/Gait assistance: Min assist Gait Distance (Feet): 10 Feet(to bathroom for standing activity and  10 feet back to chair) Assistive device: Rolling walker (2 wheeled) Gait Pattern/deviations: Step-through pattern;Decreased step length - right;Decreased step length - left;Decreased stride length Gait velocity: decreased Gait velocity interpretation: <1.31 ft/sec, indicative of household ambulator General Gait Details: cues for safest use fo the RW and postural checks.  Pt's knees never fully extended, but he could extend them in stance.   Stairs             Wheelchair Mobility    Modified Rankin (Stroke Patients Only)       Balance Overall balance assessment: Needs assistance Sitting-balance support: No upper extremity supported Sitting balance-Leahy Scale: Fair     Standing balance support: Single extremity supported;Bilateral upper extremity supported Standing balance-Leahy Scale: Poor Standing balance comment: spent about 10 min standing at the sink completing an ADL task.  Pt had to go down on forearms to use both hands for a task.                            Cognition Arousal/Alertness: Awake/alert Behavior During Therapy: WFL for tasks assessed/performed Overall Cognitive Status: (NT formally, but follow simple instruction well)                                        Exercises      General Comments        Pertinent Vitals/Pain Pain Assessment: Faces Faces Pain Scale: Hurts a little bit Pain Location: generalized with movement Pain Descriptors / Indicators: Grimacing  Pain Intervention(s): Monitored during session    Home Living                      Prior Function            PT Goals (current goals can now be found in the care plan section) Acute Rehab PT Goals Patient Stated Goal: "to get my strength back" PT Goal Formulation: With patient Time For Goal Achievement: 01/21/20 Potential to Achieve Goals: Good Progress towards PT goals: Progressing toward goals(goal revised)    Frequency    Min  3X/week      PT Plan Current plan remains appropriate    Co-evaluation PT/OT/SLP Co-Evaluation/Treatment: Yes Reason for Co-Treatment: To address functional/ADL transfers;Other (comment)(To allow pt to do more.) PT goals addressed during session: Mobility/safety with mobility;Strengthening/ROM OT goals addressed during session: ADL's and self-care;Strengthening/ROM      AM-PAC PT "6 Clicks" Mobility   Outcome Measure  Help needed turning from your back to your side while in a flat bed without using bedrails?: A Little Help needed moving from lying on your back to sitting on the side of a flat bed without using bedrails?: A Little Help needed moving to and from a bed to a chair (including a wheelchair)?: A Lot Help needed standing up from a chair using your arms (e.g., wheelchair or bedside chair)?: A Lot Help needed to walk in hospital room?: A Little Help needed climbing 3-5 steps with a railing? : A Lot 6 Click Score: 15    End of Session   Activity Tolerance: Patient tolerated treatment well;Patient limited by fatigue Patient left: in chair;with call bell/phone within reach;with chair alarm set Nurse Communication: Mobility status PT Visit Diagnosis: Other abnormalities of gait and mobility (R26.89);Muscle weakness (generalized) (M62.81);Other symptoms and signs involving the nervous system (R29.898)     Time: 8416-6063 PT Time Calculation (min) (ACUTE ONLY): 41 min  Charges:  $Gait Training: 8-22 mins $Therapeutic Activity: 8-22 mins                     01/07/2020  Ginger Carne., PT Acute Rehabilitation Services [phone]  (pager) [phone]  (office)   David [name] 01/07/2020, 4:45 PM

## 2020-01-07 NOTE — Progress Notes (Signed)
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  Found to be Covid +.   OT comments  Pt making steady progress toward OT Goals. Focused session on OOB BADL for increased endurance training. Pt able to complete bed mobility at min A +2 and sit <> stands at min-mod A +2 with RW. Pt completed functional mobility to sink with min A +2 and RW. Pt makes very slow, steady steps. Once at sink pt stood with mod A for truncal and hip support to complete oral care. He also shows some decreased initiation, sequencing, with overall increased processing time is needed. Pt requiring one seated rest break after this to [facility] for BM. Total A needed for peri care in standing due to poor static standing abilities and pt overall fatigued. D/c recs remain appropriate. Will continue to follow.    Follow Up Recommendations  SNF;Supervision/Assistance - 24 hour    Equipment Recommendations  None recommended by OT    Recommendations for Other Services      Precautions / Restrictions Precautions Precautions: Fall Restrictions Weight Bearing Restrictions: No       Mobility Bed Mobility Overal bed mobility: Needs Assistance Bed Mobility: Supine to Sit     Supine to sit: Min assist;+2 for safety/equipment     General bed mobility comments: min A +2 to sequence BLEs to EOB and to cue pt to pull up with BUEs at bed rail  Transfers Overall transfer level: Needs assistance Equipment used: Rolling walker (2  wheeled) Transfers: Sit to/from [facility] Sit to Stand: Mod assist;+2 safety/equipment;Min assist Stand pivot transfers: Mod assist;+2 safety/equipment       General transfer comment: cues for sequencing and hand placement, mod- min A +2 to rise and steady. Cues for upright posture    Balance Overall balance assessment: Needs assistance Sitting-balance support: No upper extremity supported Sitting balance-Leahy Scale: Fair     Standing balance support: Single extremity supported;Bilateral upper extremity supported Standing balance-Leahy Scale: Poor Standing balance comment: spent about 10 min standing at the sink completing an ADL task.  Pt had to go down on forearms to use both hands for a task.                           ADL either performed or assessed with clinical judgement   ADL Overall ADL's : Needs assistance/impaired     Grooming: Moderate assistance;Standing Grooming Details (indicate cue type and reason): mod A to stand at ink to support hips and trunk. Pt demonstrating decreased standing balance with reach, and poor [facility] skills                 Toilet Transfer: Moderate assistance;Ambulation;BSC;RW Toilet Transfer Details (indicate cue type and reason): pt able to complete functional mobility to bathroom with min A +2, BSC placed behind him for rest break- requiring min A +2 to stand from [place] and Hy[place]: Total assistance;Sit to/from stand Toileting - Clothing Manipulation Details (indicate cue type and reason): had  pt attempt in standing with poor dynamic standing abilities. Total A complete for peri care after BM. Noted sore on pt bottom     Functional mobility during ADLs: Minimal assistance;+2 for physical assistance;+2 for safety/equipment;Cueing for safety;Cueing for sequencing;Rolling walker General ADL Comments: focused session on OOB ADL for increased endurance     Vision       Perception      Praxis      Cognition Arousal/Alertness: Awake/alert Behavior During Therapy: WFL for tasks assessed/performed Overall Cognitive Status: Impaired/Different from baseline Area of Impairment: Safety/judgement;Problem solving;Following commands                       Following Commands: Follows one step commands with increased time Safety/Judgement: Decreased awareness of deficits Awareness: Emergent Problem Solving: Slow processing;Requires verbal cues;Requires tactile cues;Difficulty sequencing General Comments: needing increased time to process multistep commands- overall slow initiation. Presenting with STM deficits as well        Exercises     Shoulder Instructions       General Comments      Pertinent Vitals/ Pain       Pain Assessment: Faces Faces Pain Scale: Hurts a little bit Pain Location: generalized with movement Pain Descriptors / Indicators: Grimacing Pain Intervention(s): Monitored during session  Home Living                                          Prior Functioning/Environment              Frequency  Min 2X/week        Progress Toward Goals  OT Goals(current goals can now be found in the care plan section)  Progress towards OT goals: Progressing toward goals  Acute Rehab OT Goals Patient Stated Goal: "to get my strength back" OT Goal Formulation: With patient Time For Goal Achievement: 01/20/20 Potential to Achieve Goals: Good  Plan Discharge plan remains appropriate    Co-evaluation    PT/OT/SLP Co-Evaluation/Treatment: Yes Reason for Co-Treatment: For patient/therapist safety;To address functional/ADL transfers PT goals addressed during session: Mobility/safety with mobility;Strengthening/ROM OT goals addressed during session: ADL's and self-care;Strengthening/ROM      AM-PAC OT "6 Clicks" Daily Activity     Outcome Measure   Help from another person eating meals?: None Help from another person  taking care of personal grooming?: A Little Help from another person toileting, which includes using toliet, bedpan, or urinal?: A Lot Help from another person bathing (including washing, rinsing, drying)?: A Lot Help from another person to put on and taking off regular upper body clothing?: A Little Help from another person to put on and taking off regular lower body clothing?: A Lot 6 Click Score: 16    End of Session Equipment Utilized During Treatment: Gait belt;Rolling walker  OT Visit Diagnosis: Unsteadiness on feet (R26.81);Other symptoms and signs involving cognitive function;Muscle weakness (generalized) (M62.81)   Activity Tolerance Patient tolerated treatment well   Patient Left in chair;with call bell/phone within reach;with chair alarm set   Nurse Communication Mobility status        Time: 8546-2703 OT Time Calculation (min): 40 min  Charges: OT General Charges $OT Visit: 1 Visit  Zenovia Jarred, MSOT, OTR/L Acute Rehabilitation Services [facility] Office Number: [phone]  Zenovia Jarred 01/07/2020, 5:36 PM

## 2020-01-07 NOTE — TOC Progression Note (Addendum)
Transition of Care [facility]) - Progression Note    Patient Details  Name: [name] MRN: [id number] Date of Birth: Jun 06, 1941  Transition of [facility]) CM/SW [place], [place] Phone Number: 01/07/2020, 12:13 PM  Clinical Narrative:    1:17pm- CSW spoke with pt [name] via telephone at [phone]. Introduced self, role, reason for call. We discussed the barriers that had existed to finding a SNF including but not limited to COVID+ status and dialysis. CSW explained that we have an offer from [facility] that can provide transport for pt to dialysis at [facility]. Provided [employer] address, contact number and the number for SNF liaison Freda Munro. CSW also let pt wife know that we would potentially discharge pt tomorrow if medically appropriate. [place] has started preauth.  12:13pm- CSW spoke with [facility], they can offer placement and are able to transport pt to dialysis. CSW to call and discuss with pt spouse.    Expected Discharge Plan: [place] Barriers to Discharge: Continued Medical Work up  Expected Discharge Plan and Services Expected Discharge Plan: [place] Choice: [name] arrangements for the past 2 months: Single Family Home                  Readmission Risk Interventions Readmission Risk Prevention Plan 09/02/2019  Transportation Screening Complete  Medication Review (RN Care Manager) Complete  Palliative Care Screening Not Applicable  Some recent data might be hidden

## 2020-01-07 NOTE — Progress Notes (Signed)
Back from dialysis, vomited X1.

## 2020-01-07 NOTE — Progress Notes (Signed)
Patient ID: David [name], male   DOB: 05-08-1941, 79 y.o.   MRN: [id number]  PROGRESS NOTE    [facility]  OIT:[id number] DOB: Jul 10, 1941 DOA: 12/11/2019 PCP: [name], NP   Brief Narrative:  79 year old male with past medical history of end-stage renal disease on hemodialysis, 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 During the hospitalization, patient was not started on any treatment for Covid as he was not hypoxic however, subsequently his respiratory rate increased and CRP was 22.9 and was subsequently started on dexamethasone and remdesivir on 12/12/2019 He was found to have elevated troponin: Cardiology was consulted.  Cardiology deemed the patient not a candidate for cardiac intervention and troponin elevation was secondary to demand ischemia and no therapies recommended. Nephrology was consulted as well. Palliative Care was consulted and subsequently patient was made a DNR. Hospital course complicated by ongoing failure to thrive, poor p.o. intake, palliative care conversations ongoing; finally with improving oral intake and now plan for SNF placement with palliative care follow-up. He is medically stable for discharge to SNF, awaiting bed.  Completed Covid isolation on 01/01/2020.   Subjective:   Patient in bed, appears comfortable, denies any headache, no fever, no chest pain or pressure, no shortness of breath , no abdominal pain. No focal weakness.  Does have some left leg weakness but says has been there for several days not sure when it started.    Assessment & Plan:   COVID-19 virus infection -Tested  positive on 12/11/2019 -Has completed 5 days of remdesivir, 4 days of dexamethasone which was stopped on 12/15/2019 due to uremia (BUN to 99) -Respiratory status has improved and stable.  Currently on room air -Remains debilitated and frail.  PT evaluation recommended SNF placement.  Social worker following. -Completed Covid isolation on 01/01/2020 -Currently afebrile and hemodynamically stable.  Nausea and vomiting -Most likely from Covid infection and uremia.  Resolved  Elevated troponin in a patient with extensive cardiac history End-stage heart failure, systolic and diastolic combined CHF History of PEA arrest -EF of 20% -Troponin mildly elevated during admission but did not trend up.  EKG unchanged with baseline LBBB -Cardiology consulted-no indication for cardiac intervention due to end-stage heart failure and recommending optimizing volume status with hemodialysis -Palliative care consulted, as above goals of care conversation had and he is now DNR -Poor prognosis  End-stage renal disease on hemodialysis Hyperkalemia: Resolved -Nephrology following.  Dialysis as per nephrology schedule.  Continue midodrine on dialysis days.  Chronic hypotension -Continue midodrine on dialysis days  Anemia of chronic disease/microcytic anemia -Hemoglobin stable.  Monitor intermittently.  Patient received a dose of packed red cells on 01/05/2020 as per nephrology recommendations.  Thrombocytopenia -Questionable cause.  Monitor intermittently.  No signs of bleeding  Paroxysmal A. fib  -on Coumadin and amiodarone.  Currently rate controlled.  Monitor INR.  Chronic pain -Continue with tramadol  History of HIV -Stable.  Continue [name].  Last CD4 count was 413 in 09/2019 with undetectable viral load  Prediabetes -Continue SSI  Stage II pressure injury right buttock: Not present on admission Stage II pressure injury on scrotum: Not present on admission -Continue wound care   Ambulatory  dysfunction, Generalized deconditioning -PT/OT  recommending SNF.  On exam on 01/07/2020 it appears that his left lower extremity is slightly weaker, patient says it has been present for several days but unsure when it started, called wife and she could not provide any specific details either.  Discussed with PT as well to reevaluate and reassess, in the meantime will check MRI brain and L-spine.  He has no tenderness in L-spine area and no other deficits.    DVT prophylaxis: Coumadin Code Status: DNR Family Communication: Discussed with wife on 01/07/2020 Disposition Plan: SNF once left lower extremity weakness work-up is done.  Consultants: Cardiology/nephrology/palliative care  Procedures: None  Antimicrobials: Has already completed remdesivir treatment      Objective: Vitals:   01/07/20 1000 01/07/20 1039 01/07/20 1130 01/07/20 1400  BP: 126/69 115/66 (!) 172/68 (!) 115/57  Pulse: 80 83 90 77  Resp:  16 18 16   Temp:  97.6 F (36.4 C) 98.3 F (36.8 C) 98.4 F (36.9 C)  TempSrc:  Oral  Oral  SpO2:  96% 100% 100%  Weight:  80.3 kg    Height:        Intake/Output Summary (Last 24 hours) at 01/07/2020 1438 Last data filed at 01/07/2020 1039 Gross per 24 hour  Intake --  Output 1517 ml  Net -1517 ml   Filed Weights   01/05/20 2345 01/07/20 0700 01/07/20 1039  Weight: 82.5 kg 82.2 kg 80.3 kg    Examination:  Awake Alert, slightly weaker in the left lower extremity says has been present for a few days at least, right chest wall dialysis catheter. [place].AT,PERRAL Supple Neck,No JVD, No cervical lymphadenopathy appriciated.  Symmetrical Chest wall movement, Good air movement bilaterally, CTAB RRR,No Gallops, Rubs or new Murmurs, No Parasternal Heave +ve B.Sounds, Abd Soft, No tenderness, No organomegaly appriciated, No rebound - guarding or rigidity. No Cyanosis, Clubbing or edema, No new Rash or bruise   Data Reviewed: I have personally reviewed following labs and imaging  studies  CBC: Recent Labs  Lab 01/03/20 0230 01/04/20 0434 01/05/20 0558 01/06/20 0702 01/07/20 0229  WBC 5.2 5.3 5.6 4.9 5.5  NEUTROABS  --   --   --   --  3.6  HGB 8.0* 8.1* 7.6* 8.6* 8.3*  HCT 24.8* 26.1* 24.8* 27.3* 26.8*  MCV 103.3* 107.4* 106.9* 103.8* 105.1*  PLT 119* 115* 116* 122* 967*   Basic Metabolic Panel: Recent Labs  Lab 01/03/20 1508 01/04/20 0434 01/05/20 0558 01/06/20 0702 01/07/20 0229 01/07/20 1000  NA 136 139 139 139 141  --   K 4.5 4.3 4.8 3.9 4.4  --   CL 96* 100 99 100 100  --   CO2 27 28 29 27 30   --   GLUCOSE 153* 93 101* 87 91  --   BUN 39* 26* 47* 26* 44*  --   CREATININE 6.83* 4.93* 7.21* 5.31* 7.12*  --   CALCIUM 7.8* 8.2* 8.7* 8.2* 8.5*  --   PHOS 2.1*  --   --   --   --  2.2*   GFR: Estimated Creatinine Clearance: 9.9 mL/min (A) (by C-G formula based on SCr of 7.12 mg/dL (H)). Liver Function Tests: Recent Labs  Lab 01/03/20 1508  ALBUMIN 2.2*   No results for input(s): LIPASE, AMYLASE in the last 168 hours. No results for input(s): AMMONIA in the last 168 hours. Coagulation Profile: Recent Labs  Lab 01/03/20 0230 01/04/20 0434 01/05/20 0558 01/06/20 0702 01/07/20 0229  INR 3.0* 3.1* 2.7* 2.4* 2.6*  Cardiac Enzymes: No results for input(s): CKTOTAL, CKMB, CKMBINDEX, TROPONINI in the last 168 hours. BNP (last 3 results) No results for input(s): PROBNP in the last 8760 hours. HbA1C: No results for input(s): HGBA1C in the last 72 hours. CBG: Recent Labs  Lab 01/06/20 0645 01/06/20 1135 01/06/20 1658 01/06/20 2037 01/07/20 1146  GLUCAP 87 99 106* 114* 129*   Lipid Profile: No results for input(s): CHOL, HDL, LDLCALC, TRIG, CHOLHDL, LDLDIRECT in the last 72 hours. Thyroid Function Tests: No results for input(s): TSH, T4TOTAL, FREET4, T3FREE, THYROIDAB in the last 72 hours. Anemia Panel: No results for input(s): VITAMINB12, FOLATE, FERRITIN, TIBC, IRON, RETICCTPCT in the last 72 hours. Sepsis Labs: No results for  input(s): PROCALCITON, LATICACIDVEN in the last 168 hours.  No results found for this or any previous visit (from the past 240 hour(s)).   Radiology Studies: No results found.  Scheduled Meds: . allopurinol  100 mg Oral Once per day on Mon Wed Fri  . amiodarone  200 mg Oral Daily  . vitamin C  500 mg Oral Daily  . bictegravir-emtricitabine-tenofovir AF  1 tablet Oral Daily  . brimonidine  1 drop Left Eye BID  . calcitRIOL      . calcitRIOL  0.5 mcg Oral Q M,W,F-HD  . Chlorhexidine Gluconate Cloth  6 each Topical Q0600  . darbepoetin (ARANESP) injection - DIALYSIS  200 mcg Intravenous Q Mon-HD  . dorzolamide-timolol  1 drop Left Eye BID  . famotidine  10 mg Oral Daily  . feeding supplement (ENSURE ENLIVE)  237 mL Oral BID BM  . heparin      . insulin aspart  0-9 Units Subcutaneous TID WC  . latanoprost  1 drop Left Eye QHS  . midodrine  10 mg Oral Q M,W,F-HD  . montelukast  10 mg Oral QHS  . multivitamin  1 tablet Oral QHS  . psyllium  1 packet Oral Daily  . sevelamer carbonate  2,400 mg Oral TID WC  . warfarin  5 mg Oral ONCE-1800  . Warfarin - Pharmacist Dosing Inpatient   Does not apply q1800  . zinc sulfate  220 mg Oral Daily   Continuous Infusions:  Lala Lund, MD [facility] 01/07/2020, 2:38 PM

## 2020-01-07 NOTE — Progress Notes (Signed)
ANTICOAGULATION CONSULT NOTE - Follow Up Consult  Pharmacy Consult for Coumadin Indication: atrial fibrillation  Allergies  Allergen Reactions  . Dextromethorphan-Guaifenesin     UNKNOWN   . Tocotrienols     UNKNOWN   . Losartan Potassium Other (See Comments)    Causes constipation    Patient Measurements: Height: 6\' 2"  (188 cm) Weight: 181 lb 14.1 oz (82.5 kg) IBW/kg (Calculated) : 82.2  Vital Signs: Temp: 97.7 F (36.5 C) (02/03 0541) Temp Source: Oral (02/03 0541) BP: 138/68 (02/03 0541) Pulse Rate: 87 (02/03 0541)  Labs: Recent Labs    01/05/20 0558 01/05/20 0558 01/06/20 0702 01/07/20 0229  HGB 7.6*   < > 8.6* 8.3*  HCT 24.8*  --  27.3* 26.8*  PLT 116*  --  122* 128*  LABPROT 28.4*  --  26.4* 27.8*  INR 2.7*  --  2.4* 2.6*  CREATININE 7.21*  --  5.31* 7.12*   < > = values in this interval not displayed.    Estimated Creatinine Clearance: 10.1 mL/min (A) (by C-G formula based on SCr of 7.12 mg/dL (H)).  Assessment:  Anticoag: Warfarin for hx afib (CHADS2VASc = 8)- d-dimer 5.26 (last 1/23). Warfarin PTA 2.5mg  MF, 5mg  AOD (INR 1.2 on admit); on amiodarone also PTA.   INR 2.6 today; Currently at goal (2-3).   Will give a dose of 5 mg today, and continue to monitior. No signs or symptoms of bleeding noted. Patient is also on amiodarone which can impact INR levels.   Goal of Therapy:  INR 2-3 Monitor platelets by anticoagulation protocol: Yes   Plan:  Give Warfarin 5mg  today, Daily INR / CBC    Rafi Kenneth A. Levada Dy, PharmD, BCPS, FNKF Clinical Pharmacist [place] Please utilize Amion for appropriate phone number to reach the unit pharmacist ([place])

## 2020-01-07 NOTE — Progress Notes (Signed)
[facility] Progress Note   Dialysis Orders: [facility] (MWF here) 4h 400/1.5 83.5kg 2/2 bath P4 R TDC/ new R thigh AVG Hep 2000 - Mircera 62mcg IV q 2 weeks (last 12/06/19) - Calcitriol 0.41mcg PO q HD  Assessment/Plan: 1. COVID - s/p IV dexamethasone and remdesivir - off isolation 1/28; breathing easily on room air. 2. ESRD -MTTS doing MWFS here- running 3.5 MWF and 2 hr Sat - extra time PTA was due to high IDGW but this has been improved so may be able to to TIW, will trial this week; using right thigh AVG since 1/27 but difficulty today --> good bruit and thrill maybe just a bad stick; will reattempt next tx. H0pefully can get get [facility] out this week or at least prior to D/C (note: he is on coumadin)  3. Anemia - 1/p 1 unit PRBC 1/15 - Aranesp 200 q Friday hgb gradually declining down to 8.1 > 7.6 > 1u pRBC 2/1 > 8.6.  tsat 21% 1/21 ferritin 874 - given 510 mg Feraheme by primary 1/21 4. Secondary hyperparathyroidism - velphoro upset stomach so was  Changed to higher renvela - P 2.1 - decreased renvela to 2 ac and added on phos to this AMs labs - on VDRA  5. Chronic hypotension/volume - on midodrine - kept even  Sat.  Today UFG 2L with pre and post weight, will continue to monitor closely to eval for need for 4x/wk HD.   6. 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 8. Debility 9. Hx CVA 10. Afib - on amio/coumadin - INR 2.4  11. HIV - on medication 12. Disp - SNF recommended, placement pending   Jannifer Hick MD [facility] Pager [phone]    Subjective:   Seen on HD this AM - tolerating well.  RN attempted cannulation of AVG but arterial side wouldn't run.  Using [facility].  UFG 1.5L  Objective Vitals:   01/07/20 0709 01/07/20 0730 01/07/20 0800 01/07/20 0830  BP: (!) 145/77 (!) 149/79 (!) 153/74 (!) 137/59  Pulse: 86 83 81 83   Resp: 18     Temp:      TempSrc:      SpO2:      Weight:      Height:       Physical Exam General: NAD breathing easily room air  Heart: RRR Lungs: no rales Abdomen: soft NT ND  Extremities: no LE edema Dialysis Access:  Right IJ TDC and right thigh AVGG + bruit   Additional Objective Labs: Lab Results  Component Value Date   INR 2.6 (H) 01/07/2020   INR 2.4 (H) 01/06/2020   INR 2.7 (H) 25/36/6440    Basic Metabolic Panel: Recent Labs  Lab 01/03/20 1508 01/04/20 0434 01/05/20 0558 01/06/20 0702 01/07/20 0229  NA 136   < > 139 139 141  K 4.5   < > 4.8 3.9 4.4  CL 96*   < > 99 100 100  CO2 27   < > 29 27 30   GLUCOSE 153*   < > 101* 87 91  BUN 39*   < > 47* 26* 44*  CREATININE 6.83*   < > 7.21* 5.31* 7.12*  CALCIUM 7.8*   < > 8.7* 8.2* 8.5*  PHOS 2.1*  --   --   --   --    < > =  values in this interval not displayed.   Liver Function Tests: Recent Labs  Lab 01/03/20 1508  ALBUMIN 2.2*   No results for input(s): LIPASE, AMYLASE in the last 168 hours. CBC: Recent Labs  Lab 01/03/20 0230 01/03/20 0230 01/04/20 0434 01/04/20 0434 01/05/20 0558 01/06/20 0702 01/07/20 0229  WBC 5.2   < > 5.3   < > 5.6 4.9 5.5  NEUTROABS  --   --   --   --   --   --  3.6  HGB 8.0*   < > 8.1*   < > 7.6* 8.6* 8.3*  HCT 24.8*   < > 26.1*   < > 24.8* 27.3* 26.8*  MCV 103.3*  --  107.4*  --  106.9* 103.8* 105.1*  PLT 119*   < > 115*   < > 116* 122* 128*   < > = values in this interval not displayed.   Blood Culture    Component Value Date/Time   SDES BLOOD RIGHT HAND 12/11/2019 2100   SPECREQUEST  12/11/2019 2100    BOTTLES DRAWN AEROBIC ONLY Blood Culture results may not be optimal due to an inadequate volume of blood received in culture bottles   CULT  12/11/2019 2100    NO GROWTH 5 DAYS Performed at [facility] [address], [place], [place] [address]    REPTSTATUS 12/16/2019 FINAL 12/11/2019 2100    Cardiac Enzymes: No results for input(s): CKTOTAL,  CKMB, CKMBINDEX, TROPONINI in the last 168 hours. CBG: Recent Labs  Lab 01/05/20 1928 01/06/20 0645 01/06/20 1135 01/06/20 1658 01/06/20 2037  GLUCAP 90 87 99 106* 114*   Iron Studies: No results for input(s): IRON, TIBC, TRANSFERRIN, FERRITIN in the last 72 hours. Lab Results  Component Value Date   INR 2.6 (H) 01/07/2020   INR 2.4 (H) 01/06/2020   INR 2.7 (H) 01/05/2020   Studies/Results: No results found. Medications:  . allopurinol  100 mg Oral Once per day on Mon Wed Fri  . amiodarone  200 mg Oral Daily  . vitamin C  500 mg Oral Daily  . bictegravir-emtricitabine-tenofovir AF  1 tablet Oral Daily  . brimonidine  1 drop Left Eye BID  . calcitRIOL  0.5 mcg Oral Q M,W,F-HD  . Chlorhexidine Gluconate Cloth  6 each Topical Q0600  . darbepoetin (ARANESP) injection - DIALYSIS  200 mcg Intravenous Q Mon-HD  . dorzolamide-timolol  1 drop Left Eye BID  . famotidine  10 mg Oral Daily  . feeding supplement (ENSURE ENLIVE)  237 mL Oral BID BM  . heparin  2,000 Units Dialysis Once in dialysis  . heparin  2,000 Units Dialysis Once in dialysis  . insulin aspart  0-9 Units Subcutaneous TID WC  . latanoprost  1 drop Left Eye QHS  . midodrine  10 mg Oral Q M,W,F-HD  . montelukast  10 mg Oral QHS  . multivitamin  1 tablet Oral QHS  . psyllium  1 packet Oral Daily  . sevelamer carbonate  2,400 mg Oral TID WC  . warfarin  5 mg Oral ONCE-1800  . Warfarin - Pharmacist Dosing Inpatient   Does not apply q1800  . zinc sulfate  220 mg Oral Daily

## 2020-01-08 ENCOUNTER — Inpatient Hospital Stay (HOSPITAL_COMMUNITY): Payer: Medicare HMO

## 2020-01-08 LAB — BASIC METABOLIC PANEL
Anion gap: 13 (ref 5–15)
BUN: 39 mg/dL — ABNORMAL HIGH (ref 8–23)
CO2: 29 mmol/L (ref 22–32)
Calcium: 9 mg/dL (ref 8.9–10.3)
Chloride: 97 mmol/L — ABNORMAL LOW (ref 98–111)
Creatinine, Ser: 6.19 mg/dL — ABNORMAL HIGH (ref 0.61–1.24)
GFR calc Af Amer: 9 mL/min — ABNORMAL LOW (ref >60)
GFR calc non Af Amer: 8 mL/min — ABNORMAL LOW (ref >60)
Glucose, Bld: 148 mg/dL — ABNORMAL HIGH (ref 70–99)
Potassium: 4 mmol/L (ref 3.5–5.1)
Sodium: 139 mmol/L (ref 135–145)

## 2020-01-08 LAB — CBC
HCT: 29.2 % — ABNORMAL LOW (ref 39.0–52.0)
Hemoglobin: 9.2 g/dL — ABNORMAL LOW (ref 13.0–17.0)
MCH: 32.9 pg (ref 26.0–34.0)
MCHC: 31.5 g/dL (ref 30.0–36.0)
MCV: 104.3 fL — ABNORMAL HIGH (ref 80.0–100.0)
Platelets: 145 10*3/uL — ABNORMAL LOW (ref 150–400)
RBC: 2.8 MIL/uL — ABNORMAL LOW (ref 4.22–5.81)
RDW: 19.3 % — ABNORMAL HIGH (ref 11.5–15.5)
WBC: 4.8 10*3/uL (ref 4.0–10.5)
nRBC: 0 % (ref 0.0–0.2)

## 2020-01-08 LAB — MAGNESIUM: Magnesium: 1.9 mg/dL (ref 1.7–2.4)

## 2020-01-08 LAB — GLUCOSE, CAPILLARY
Glucose-Capillary: 89 mg/dL (ref 70–99)
Glucose-Capillary: 112 mg/dL — ABNORMAL HIGH (ref 70–99)
Glucose-Capillary: 146 mg/dL — ABNORMAL HIGH (ref 70–99)

## 2020-01-08 LAB — LIPID PANEL
Cholesterol: 122 mg/dL (ref 0–200)
HDL: 44 mg/dL (ref >40)
LDL Cholesterol: 72 mg/dL (ref 0–99)
Total CHOL/HDL Ratio: 2.8 RATIO
Triglycerides: 29 mg/dL (ref <150)
VLDL: 6 mg/dL (ref 0–40)

## 2020-01-08 LAB — D-DIMER, QUANTITATIVE: D-Dimer, Quant: 2.85 ug/mL-FEU — ABNORMAL HIGH (ref 0.00–0.50)

## 2020-01-08 LAB — PHOSPHORUS: Phosphorus: 1.9 mg/dL — ABNORMAL LOW (ref 2.5–4.6)

## 2020-01-08 LAB — PROTIME-INR
INR: 2.3 — ABNORMAL HIGH (ref 0.8–1.2)
Prothrombin Time: 25.1 seconds — ABNORMAL HIGH (ref 11.4–15.2)

## 2020-01-08 LAB — BRAIN NATRIURETIC PEPTIDE: B Natriuretic Peptide: 890.8 pg/mL — ABNORMAL HIGH (ref 0.0–100.0)

## 2020-01-08 LAB — C-REACTIVE PROTEIN: CRP: 6.6 mg/dL — ABNORMAL HIGH (ref <1.0)

## 2020-01-08 MED ORDER — K PHOS MONO-SOD PHOS DI & MONO 155-852-130 MG PO TABS
500.0000 mg | ORAL_TABLET | Freq: Two times a day (BID) | ORAL | Status: AC
  Administered 2020-01-08 (×2): 500 mg via ORAL
  Filled 2020-01-08 (×3): qty 2

## 2020-01-08 MED ORDER — MIDODRINE HCL 5 MG PO TABS
ORAL_TABLET | ORAL | Status: AC
  Administered 2020-01-08: 10 mg via ORAL
  Filled 2020-01-08: qty 2

## 2020-01-08 MED ORDER — DARBEPOETIN ALFA 200 MCG/0.4ML IJ SOSY: 200.0000 ug | PREFILLED_SYRINGE | INTRAMUSCULAR | Status: DC

## 2020-01-08 MED ORDER — WARFARIN SODIUM 5 MG PO TABS
5.0000 mg | ORAL_TABLET | Freq: Once | ORAL | Status: AC
  Administered 2020-01-08: 5 mg via ORAL
  Filled 2020-01-08: qty 1

## 2020-01-08 NOTE — Progress Notes (Signed)
ANTICOAGULATION CONSULT NOTE - Follow Up Consult  Pharmacy Consult for Coumadin Indication: atrial fibrillation  Allergies  Allergen Reactions  . Dextromethorphan-Guaifenesin     UNKNOWN   . Tocotrienols     UNKNOWN   . Losartan Potassium Other (See Comments)    Causes constipation    Patient Measurements: Height: 6\' 2"  (188 cm) Weight: 177 lb 0.5 oz (80.3 kg) IBW/kg (Calculated) : 82.2  Vital Signs: Temp: 98.1 F (36.7 C) (02/04 0505) Temp Source: Oral (02/04 0505) BP: 123/64 (02/04 0505) Pulse Rate: 86 (02/04 0505)  Labs: Recent Labs    01/06/20 0702 01/07/20 0229 01/08/20 0117  HGB 8.6* 8.3*  --   HCT 27.3* 26.8*  --   PLT 122* 128*  --   LABPROT 26.4* 27.8* 25.1*  INR 2.4* 2.6* 2.3*  CREATININE 5.31* 7.12*  --     Estimated Creatinine Clearance: 9.9 mL/min (A) (by C-G formula based on SCr of 7.12 mg/dL (H)).  Assessment:  Anticoag: Warfarin for hx afib (CHADS2VASc = 8)- d-dimer 5.26 (last 1/23). Warfarin PTA 2.5mg  MF, 5mg  AOD (INR 1.2 on admit); on amiodarone also PTA.   INR 2.3 today; Currently at goal (2-3).   Will give a dose of 5 mg today, and continue to monitior. No signs or symptoms of bleeding noted. Patient is also on amiodarone which can impact INR levels.   Goal of Therapy:  INR 2-3 Monitor platelets by anticoagulation protocol: Yes   Plan:  Give Warfarin 5mg  today, Daily INR / CBC    Oliver Heitzenrater A. Levada Dy, PharmD, BCPS, FNKF Clinical Pharmacist [place] Please utilize Amion for appropriate phone number to reach the unit pharmacist ([place])

## 2020-01-08 NOTE — Progress Notes (Signed)
Patient ID: David [name], male   DOB: 09/18/1941, 79 y.o.   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 During the hospitalization, patient was not started on any treatment for Covid as he was not hypoxic however, subsequently his respiratory rate increased and CRP was 22.9 and was subsequently started on dexamethasone and remdesivir on 12/12/2019 He was found to have elevated troponin: Cardiology was consulted.  Cardiology deemed the patient not a candidate for cardiac intervention and troponin elevation was secondary to demand ischemia and no therapies recommended. Nephrology was consulted as well. Palliative Care was consulted and subsequently patient was made a DNR. Hospital course complicated by ongoing failure to thrive, poor p.o. intake, palliative care conversations ongoing; finally with improving oral intake and now plan for SNF placement with palliative care follow-up. He is medically stable for discharge to SNF, awaiting bed.  Completed Covid isolation on 01/01/2020.   Subjective:   Patient in bed, appears comfortable, denies any headache, no fever, no chest pain or pressure, no shortness of breath , no abdominal pain.  Continues to have mild left-sided weakness in his leg     Assessment & Plan:   COVID-19 virus infection -Tested positive on 12/11/2019 -Has completed 5 days of remdesivir, 4  days of dexamethasone which was stopped on 12/15/2019 due to uremia (BUN to 99) -Respiratory status has improved and stable.  Currently on room air -Remains debilitated and frail.  PT evaluation recommended SNF placement.  If stable after dialysis will be discharged on 01/09/2020. -Completed Covid isolation on 01/01/2020 -Currently afebrile and hemodynamically stable.  Nausea and vomiting -Most likely from Covid infection and uremia.  Resolved  Elevated troponin in a patient with extensive cardiac history End-stage heart failure, systolic and diastolic combined CHF History of PEA arrest -EF of 20% -Troponin mildly elevated during admission but did not trend up.  EKG unchanged with baseline LBBB -Cardiology consulted-no indication for cardiac intervention due to end-stage heart failure and recommending optimizing volume status with hemodialysis -Palliative care consulted, as above goals of care conversation had and he is now DNR -Poor prognosis  End-stage renal disease on hemodialysis Hyperkalemia: Resolved -Nephrology following.  Dialysis as per nephrology schedule.  Continue midodrine on dialysis days.  Chronic hypotension -Continue midodrine on dialysis days  Anemia of chronic disease/microcytic anemia -Hemoglobin stable.  Monitor intermittently.  Patient received a dose of packed red cells on 01/05/2020 as per nephrology recommendations.  Thrombocytopenia -Questionable cause.  Monitor intermittently.  No signs of bleeding  Paroxysmal A. fib  -on Coumadin and amiodarone.  Currently rate controlled.  Monitor INR.  Chronic pain -Continue with tramadol  History of HIV -Stable.  Continue [name].  Last CD4 count was 413 in 09/2019 with undetectable viral load  Prediabetes -Continue SSI  Stage II pressure injury right buttock: Not present on admission Stage II pressure injury on scrotum: Not present on admission -Continue wound care   Ambulatory dysfunction, Generalized  deconditioning -PT/OT recommending SNF.  On exam on  01/07/2020 it appears that his left lower extremity is slightly weaker, patient says it has been present for several days but unsure when it started, called wife and she could not provide any specific details either.  Discussed with PT as well to reevaluate and reassess, MRI brain shows old left-sided CVA which is not consistent with the site of his weakness, L-spine MRI stable.  This likely is due to deconditioning.  Continue PT OT.    DVT prophylaxis: Coumadin Code Status: DNR Family Communication: Discussed with wife on 01/07/2020 Disposition Plan: SNF on 01/09/2020 postdialysis if stable.  Consultants: Cardiology/nephrology/palliative care  Procedures: None  Antimicrobials: Has already completed remdesivir treatment      Objective: Vitals:   01/07/20 1130 01/07/20 1400 01/07/20 2036 01/08/20 0505  BP: (!) 172/68 (!) 115/57 110/64 123/64  Pulse: 90 77 82 86  Resp: 18 16 20 18   Temp: 98.3 F (36.8 C) 98.4 F (36.9 C) 99 F (37.2 C) 98.1 F (36.7 C)  TempSrc:  Oral Oral Oral  SpO2: 100% 100% 99% 98%  Weight:      Height:        Intake/Output Summary (Last 24 hours) at 01/08/2020 1043 Last data filed at 01/08/2020 9030 Gross per 24 hour  Intake 480 ml  Output --  Net 480 ml   Filed Weights   01/05/20 2345 01/07/20 0700 01/07/20 1039  Weight: 82.5 kg 82.2 kg 80.3 kg    Examination:  Awake Alert, slightly weaker in the left lower extremity says has been present for a few days at least, right chest wall dialysis catheter. .AT,PERRAL Supple Neck,No JVD, No cervical lymphadenopathy appriciated.  Symmetrical Chest wall movement, Good air movement bilaterally, CTAB RRR,No Gallops, Rubs or new Murmurs, No Parasternal Heave +ve B.Sounds, Abd Soft, No tenderness, No organomegaly appriciated, No rebound - guarding or rigidity. No Cyanosis, Clubbing or edema     Data Reviewed: I have personally reviewed following labs and  imaging studies  CBC: Recent Labs  Lab 01/03/20 0230 01/04/20 0434 01/05/20 0558 01/06/20 0702 01/07/20 0229  WBC 5.2 5.3 5.6 4.9 5.5  NEUTROABS  --   --   --   --  3.6  HGB 8.0* 8.1* 7.6* 8.6* 8.3*  HCT 24.8* 26.1* 24.8* 27.3* 26.8*  MCV 103.3* 107.4* 106.9* 103.8* 105.1*  PLT 119* 115* 116* 122* 092*   Basic Metabolic Panel: Recent Labs  Lab 01/03/20 1508 01/04/20 0434 01/05/20 0558 01/06/20 0702 01/07/20 0229 01/07/20 1000  NA 136 139 139 139 141  --   K 4.5 4.3 4.8 3.9 4.4  --   CL 96* 100 99 100 100  --   CO2 27 28 29 27 30   --   GLUCOSE 153* 93 101* 87 91  --   BUN 39* 26* 47* 26* 44*  --   CREATININE 6.83* 4.93* 7.21* 5.31* 7.12*  --   CALCIUM 7.8* 8.2* 8.7* 8.2* 8.5*  --   PHOS 2.1*  --   --   --   --  2.2*   GFR: Estimated Creatinine Clearance: 9.9 mL/min (A) (by C-G formula based on SCr of 7.12 mg/dL (H)). Liver Function Tests: Recent Labs  Lab 01/03/20 1508  ALBUMIN 2.2*   No results for input(s): LIPASE, AMYLASE in the last 168 hours. No results for input(s): AMMONIA in the last 168 hours. Coagulation Profile: Recent Labs  Lab 01/04/20 0434 01/05/20 0558 01/06/20 0702 01/07/20 0229 01/08/20 0117  INR 3.1* 2.7* 2.4* 2.6* 2.3*   Cardiac  Enzymes: No results for input(s): CKTOTAL, CKMB, CKMBINDEX, TROPONINI in the last 168 hours. BNP (last 3 results) No results for input(s): PROBNP in the last 8760 hours. HbA1C: Recent Labs    01/07/20 1428  HGBA1C 5.4   CBG: Recent Labs  Lab 01/06/20 2037 01/07/20 1146 01/07/20 1605 01/07/20 2143 01/08/20 0825  GLUCAP 114* 129* 123* 132* 89   Lipid Profile: Recent Labs    01/08/20 0117  CHOL 122  HDL 44  LDLCALC 72  TRIG 29  CHOLHDL 2.8   Thyroid Function Tests: No results for input(s): TSH, T4TOTAL, FREET4, T3FREE, THYROIDAB in the last 72 hours. Anemia Panel: No results for input(s): VITAMINB12, FOLATE, FERRITIN, TIBC, IRON, RETICCTPCT in the last 72 hours. Sepsis Labs: No results  for input(s): PROCALCITON, LATICACIDVEN in the last 168 hours.  No results found for this or any previous visit (from the past 240 hour(s)).   Radiology Studies: MR BRAIN WO CONTRAST  Result Date: 01/08/2020 CLINICAL DATA:  79 year old male with left extremity weakness, generalized deconditioning. EXAM: MRI HEAD WITHOUT CONTRAST TECHNIQUE: Multiplanar, multiecho pulse sequences of the brain and surrounding structures were obtained without intravenous contrast. COMPARISON:  Brain MRI 11/11/2017.  Head CT 09/05/2019. FINDINGS: Brain: No restricted diffusion or evidence of acute infarction. Tiny chronic lacunar infarcts in the right cerebellum are stable. Mild for age T2 heterogeneity in the pons and more moderate for age bilateral cerebral white matter T2 and FLAIR hyperintensity. There is a small area of chronic cortical encephalomalacia in the left superior parietal lobe including some involvement of the sensory strip (series 11, image 20). Stable cerebral volume since 2018. Mild nonspecific ventriculomegaly, stable, and the temporal horns remain relatively diminutive. No midline shift, mass effect, evidence of mass lesion, extra-axial collection or acute intracranial hemorrhage. Cervicomedullary junction and pituitary are within normal limits. The deep gray matter nuclei remain within normal limits. No new signal changes identified since 2018. Vascular: Major intracranial vascular flow voids are stable since 2018. Skull and upper cervical spine: Negative for age visible cervical spine. Visualized bone marrow signal is within normal limits. Sinuses/Orbits: Mucosal thickening stable and negative orbits. Mild paranasal is stable sinus. Other: Mastoid air cells remain clear. Visible internal auditory structures appear normal. IMPRESSION: 1. No acute intracranial abnormality. Stable MRI appearance of the brain since 2018. 2. Small chronic posterior left MCA territory infarct, and evidence of chronic small vessel  disease in the cerebral white matter and cerebellum. 3. Mild ventriculomegaly is chronic and probably ex vacuo in nature. Electronically Signed   By: Genevie Ann M.D.   On: 01/08/2020 09:08   MR LUMBAR SPINE WO CONTRAST  Result Date: 01/08/2020 CLINICAL DATA:  Lumbar radiculopathy. EXAM: MRI LUMBAR SPINE WITHOUT CONTRAST TECHNIQUE: Multiplanar, multisequence MR imaging of the lumbar spine was performed. No intravenous contrast was administered. COMPARISON:  Correlation made with CT of the abdomen performed March 15, 2019 and Apr 30, 1999. FINDINGS: Segmentation:  Standard. Alignment:  Physiologic. Vertebrae: Radiation induced fatty marrow replacement is noted in the sacrum and L5. Focal lesions in the L4 and L5 vertebral bodies with predominantly low signal on T1 and T2 and increased signal on STIR correspond to a sclerotic lesion seen on prior CT. Conus medullaris and cauda equina: Conus extends to the L1 level. Conus and cauda equina appear normal. Paraspinal and other soft tissues: Negative. Disc levels: L1-2: Mild facet degenerative changes without spinal canal or neural foraminal stenosis. L2-3: Small symmetric disc bulge, facet degenerative changes and ligamentum flavum hypertrophy resulting  in mild left neural foraminal stenosis. No significant neural. L3-4: Disc bulge with superimposed central disc protrusion, facet degenerative changes flavum resulting in mild neural. No significant spinal stenosis. L4-5: Disc bulge, facet degenerative resulting mild bilateral neural foraminal narrowing and mild spinal stenosis with obliteration of the subarticular zones. L5-S1: Facet degenerative changes without significant spinal canal or neural stenosis. IMPRESSION: 1. Mild degenerative changes of lumbar spine pronounced at L4-5 where there is mild spinal canal stenosis with obliteration the subarticular zones and mild bilateral neural foraminal stenosis. 2. Focal lesions in the L4 and L5 vertebral bodies appear stable in  size since 2015. Electronically Signed   By: Pedro Earls M.D.   On: 01/08/2020 09:59    Scheduled Meds: . allopurinol  100 mg Oral Once per day on Mon Wed Fri  . amiodarone  200 mg Oral Daily  . vitamin C  500 mg Oral Daily  . bictegravir-emtricitabine-tenofovir AF  1 tablet Oral Daily  . brimonidine  1 drop Left Eye BID  . calcitRIOL  0.5 mcg Oral Q M,W,F-HD  . Chlorhexidine Gluconate Cloth  6 each Topical Q0600  . [START ON 01/09/2020] darbepoetin (ARANESP) injection - DIALYSIS  200 mcg Intravenous Q Fri-HD  . dorzolamide-timolol  1 drop Left Eye BID  . famotidine  10 mg Oral Daily  . feeding supplement (ENSURE ENLIVE)  237 mL Oral BID BM  . insulin aspart  0-9 Units Subcutaneous TID WC  . latanoprost  1 drop Left Eye QHS  . midodrine  10 mg Oral Q M,W,F-HD  . montelukast  10 mg Oral QHS  . multivitamin  1 tablet Oral QHS  . psyllium  1 packet Oral Daily  . sevelamer carbonate  2,400 mg Oral TID WC  . warfarin  5 mg Oral ONCE-1800  . Warfarin - Pharmacist Dosing Inpatient   Does not apply q1800  . zinc sulfate  220 mg Oral Daily   Continuous Infusions:  Lala Lund, MD [facility] 01/08/2020, 10:43 AM

## 2020-01-08 NOTE — Progress Notes (Signed)
PT Cancellation Note  Patient Details Name: David [name] MRN: [id number] DOB: 1941/04/22   Cancelled Treatment:    Reason Eval/Treat Not Completed: Patient at procedure or test/unavailable - Pt at dialysis, PT to check back as schedule allows.  [place] Pager [phone]  Office [place] 01/08/2020, 1:05 PM

## 2020-01-08 NOTE — Progress Notes (Signed)
Renal Navigator received notification from CSW/I. Chasse that plan for patient is discharge to SNF tomorrow 01/09/20. Patient's OP HD schedule will be TTS at SNF (usually [facility], which he can resume once he is discharged from SNF-this has been discussed with Dr. Johnney Ou, who recommends low fluid, low sodium diet at [facility]). Patient has been on MWF HD while inpt. Renal Navigator has requested that Nephrology write orders for today instead of tomorrow so that he will get on his OP HD schedule, not be discharged on a dialysis day, and so that his discharge to SNF does not get delayed by needing to wait for HD. Plan discussed with team who are all in agreement. Patient will have HD today, 01/08/20, not tomorrow, with anticipated discharge tomorrow, 01/09/20. Renal Navigator will notify OP HD clinic[facility] to expect patient to return starting Saturday, 01/10/20.  Alphonzo Cruise, [place] Renal Navigator [phone]

## 2020-01-08 NOTE — TOC Progression Note (Signed)
Transition of Care [facility]) - Progression Note    Patient Details  Name: Rashon Westrup MRN: [id number] Date of Birth: 09-Dec-1940  Transition of Care [facility]) CM/SW [place], [place] Phone Number: 01/08/2020, 4:23 PM  Clinical Narrative:    CSW spoke with pt wife via telephone. She has completed paperwork for SNF. No new COVID swab needed at this time. Plan for tx to [facility] tomorrow pending medical stability.    Expected Discharge Plan: [place] Barriers to Discharge: Continued Medical Work up  Expected Discharge Plan and Services Expected Discharge Plan: [place] Choice: [place] arrangements for the past 2 months: Single Family Home  Readmission Risk Interventions Readmission Risk Prevention Plan 09/02/2019  Transportation Screening Complete  Medication Review (RN Care Manager) Complete  Palliative Care Screening Not Applicable  Some recent data might be hidden

## 2020-01-08 NOTE — Progress Notes (Signed)
[facility] Progress Note   Dialysis Orders: [facility] (MWF here) 4h 400/1.5 83.5kg 2/2 bath P4 R TDC/ new R thigh AVG Hep 2000 - Mircera 3mcg IV q 2 weeks (last 12/06/19) - Calcitriol 0.38mcg PO q HD  Assessment/Plan: 1. COVID - s/p IV dexamethasone and remdesivir - off isolation 1/28; breathing easily on room air. 2. ESRD -MTTS doing MWFS here- running 3.5 MWF and 2 hr Sat - extra time PTA was due to high IDGW but this has been improved so may be able to to TIW, will trial this week; using right thigh AVG since 1/27 but difficulty today --> good bruit and thrill maybe just a bad stick; will reattempt next tx. H0pefully can get get [facility] out this week or at least prior to D/C (note: he is on coumadin)  3. Anemia - 1/p 1 unit PRBC 1/15 - Aranesp 200 q Friday hgb gradually declining down to 8.1 > 7.6 > 1u pRBC 2/1 > 8.6.  tsat 21% 1/21 ferritin 874 - given 510 mg Feraheme by primary 1/21 4. Secondary hyperparathyroidism - velphoro upset stomach so was  Changed to higher renvela - P 2.1 - decreased renvela to 2 ac and added on phos to this AMs labs - on VDRA  5. Chronic hypotension/volume - on midodrine - kept even  Sat.  Today UFG 2L with pre and post weight, will continue to monitor closely to eval for need for 4x/wk HD.   6. 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 8. Debility - appears new LE weakness being w/u : MRI pending currently 9. Hx CVA 10. Afib - on amio/coumadin - INR 2.4  11. HIV - on medication 12. Disp - SNF recommended, placement pending   Jannifer Hick MD [facility] Pager [phone]    Subjective:   Eating breakfast this AM, no new issues.  HD fine yesterday. UF 1.5L.  Objective Vitals:   01/07/20 1130 01/07/20 1400 01/07/20 2036 01/08/20 0505  BP: (!) 172/68 (!) 115/57 110/64 123/64  Pulse: 90 77 82 86   Resp: 18 16 20 18   Temp: 98.3 F (36.8 C) 98.4 F (36.9 C) 99 F (37.2 C) 98.1 F (36.7 C)  TempSrc:  Oral Oral Oral  SpO2: 100% 100% 99% 98%  Weight:      Height:       Physical Exam General: NAD breathing easily room air  Heart: RRR Lungs: no rales Abdomen: soft NT ND  Extremities: no LE edema Dialysis Access:  Right IJ TDC and right thigh AVGG + bruit   Additional Objective Labs: Lab Results  Component Value Date   INR 2.3 (H) 01/08/2020   INR 2.6 (H) 01/07/2020   INR 2.4 (H) 62/95/2841    Basic Metabolic Panel: Recent Labs  Lab 01/03/20 1508 01/04/20 0434 01/05/20 0558 01/06/20 0702 01/07/20 0229 01/07/20 1000  NA 136   < > 139 139 141  --   K 4.5   < > 4.8 3.9 4.4  --   CL 96*   < > 99 100 100  --   CO2 27   < > 29 27 30   --   GLUCOSE 153*   < > 101* 87 91  --   BUN 39*   < > 47* 26* 44*  --   CREATININE 6.83*   < >  7.21* 5.31* 7.12*  --   CALCIUM 7.8*   < > 8.7* 8.2* 8.5*  --   PHOS 2.1*  --   --   --   --  2.2*   < > = values in this interval not displayed.   Liver Function Tests: Recent Labs  Lab 01/03/20 1508  ALBUMIN 2.2*   No results for input(s): LIPASE, AMYLASE in the last 168 hours. CBC: Recent Labs  Lab 01/03/20 0230 01/03/20 0230 01/04/20 0434 01/04/20 0434 01/05/20 0558 01/06/20 0702 01/07/20 0229  WBC 5.2   < > 5.3   < > 5.6 4.9 5.5  NEUTROABS  --   --   --   --   --   --  3.6  HGB 8.0*   < > 8.1*   < > 7.6* 8.6* 8.3*  HCT 24.8*   < > 26.1*   < > 24.8* 27.3* 26.8*  MCV 103.3*  --  107.4*  --  106.9* 103.8* 105.1*  PLT 119*   < > 115*   < > 116* 122* 128*   < > = values in this interval not displayed.   Blood Culture    Component Value Date/Time   SDES BLOOD RIGHT HAND 12/11/2019 2100   SPECREQUEST  12/11/2019 2100    BOTTLES DRAWN AEROBIC ONLY Blood Culture results may not be optimal due to an inadequate volume of blood received in culture bottles   CULT  12/11/2019 2100    NO GROWTH 5 DAYS Performed at [facility] [address], [place], [place] [address]    REPTSTATUS 12/16/2019 FINAL 12/11/2019 2100    Cardiac Enzymes: No results for input(s): CKTOTAL, CKMB, CKMBINDEX, TROPONINI in the last 168 hours. CBG: Recent Labs  Lab 01/06/20 2037 01/07/20 1146 01/07/20 1605 01/07/20 2143 01/08/20 0825  GLUCAP 114* 129* 123* 132* 89   Iron Studies: No results for input(s): IRON, TIBC, TRANSFERRIN, FERRITIN in the last 72 hours. Lab Results  Component Value Date   INR 2.3 (H) 01/08/2020   INR 2.6 (H) 01/07/2020   INR 2.4 (H) 01/06/2020   Studies/Results: No results found. Medications:  . allopurinol  100 mg Oral Once per day on Mon Wed Fri  . amiodarone  200 mg Oral Daily  . vitamin C  500 mg Oral Daily  . bictegravir-emtricitabine-tenofovir AF  1 tablet Oral Daily  . brimonidine  1 drop Left Eye BID  . calcitRIOL  0.5 mcg Oral Q M,W,F-HD  . Chlorhexidine Gluconate Cloth  6 each Topical Q0600  . [START ON 01/09/2020] darbepoetin (ARANESP) injection - DIALYSIS  200 mcg Intravenous Q Fri-HD  . dorzolamide-timolol  1 drop Left Eye BID  . famotidine  10 mg Oral Daily  . feeding supplement (ENSURE ENLIVE)  237 mL Oral BID BM  . insulin aspart  0-9 Units Subcutaneous TID WC  . latanoprost  1 drop Left Eye QHS  . midodrine  10 mg Oral Q M,W,F-HD  . montelukast  10 mg Oral QHS  . multivitamin  1 tablet Oral QHS  . psyllium  1 packet Oral Daily  . sevelamer carbonate  2,400 mg Oral TID WC  . warfarin  5 mg Oral ONCE-1800  . Warfarin - Pharmacist Dosing Inpatient   Does not apply q1800  . zinc sulfate  220 mg Oral Daily

## 2020-01-09 DIAGNOSIS — Z992 Dependence on renal dialysis: Secondary | ICD-10-CM | POA: Diagnosis not present

## 2020-01-09 DIAGNOSIS — R5381 Other malaise: Secondary | ICD-10-CM | POA: Diagnosis not present

## 2020-01-09 DIAGNOSIS — R778 Other specified abnormalities of plasma proteins: Secondary | ICD-10-CM | POA: Diagnosis not present

## 2020-01-09 DIAGNOSIS — Y832 Surgical operation with anastomosis, bypass or graft as the cause of abnormal reaction of the patient, or of later complication, without mention of misadventure at the time of the procedure: Secondary | ICD-10-CM | POA: Diagnosis present

## 2020-01-09 DIAGNOSIS — Z66 Do not resuscitate: Secondary | ICD-10-CM | POA: Diagnosis not present

## 2020-01-09 DIAGNOSIS — I5084 End stage heart failure: Secondary | ICD-10-CM | POA: Diagnosis present

## 2020-01-09 DIAGNOSIS — R791 Abnormal coagulation profile: Secondary | ICD-10-CM | POA: Diagnosis not present

## 2020-01-09 DIAGNOSIS — Z7901 Long term (current) use of anticoagulants: Secondary | ICD-10-CM | POA: Diagnosis not present

## 2020-01-09 DIAGNOSIS — D631 Anemia in chronic kidney disease: Secondary | ICD-10-CM | POA: Diagnosis present

## 2020-01-09 DIAGNOSIS — I120 Hypertensive chronic kidney disease with stage 5 chronic kidney disease or end stage renal disease: Secondary | ICD-10-CM | POA: Diagnosis not present

## 2020-01-09 DIAGNOSIS — T82868A Thrombosis of vascular prosthetic devices, implants and grafts, initial encounter: Secondary | ICD-10-CM | POA: Diagnosis not present

## 2020-01-09 DIAGNOSIS — I5040 Unspecified combined systolic (congestive) and diastolic (congestive) heart failure: Secondary | ICD-10-CM | POA: Diagnosis not present

## 2020-01-09 DIAGNOSIS — I10 Essential (primary) hypertension: Secondary | ICD-10-CM | POA: Diagnosis not present

## 2020-01-09 DIAGNOSIS — I712 Thoracic aortic aneurysm, without rupture: Secondary | ICD-10-CM | POA: Diagnosis present

## 2020-01-09 DIAGNOSIS — I447 Left bundle-branch block, unspecified: Secondary | ICD-10-CM | POA: Diagnosis not present

## 2020-01-09 DIAGNOSIS — N186 End stage renal disease: Secondary | ICD-10-CM | POA: Diagnosis not present

## 2020-01-09 DIAGNOSIS — Z8674 Personal history of sudden cardiac arrest: Secondary | ICD-10-CM | POA: Diagnosis not present

## 2020-01-09 DIAGNOSIS — I428 Other cardiomyopathies: Secondary | ICD-10-CM | POA: Diagnosis not present

## 2020-01-09 DIAGNOSIS — E43 Unspecified severe protein-calorie malnutrition: Secondary | ICD-10-CM | POA: Diagnosis not present

## 2020-01-09 DIAGNOSIS — R0602 Shortness of breath: Secondary | ICD-10-CM | POA: Diagnosis not present

## 2020-01-09 DIAGNOSIS — I2510 Atherosclerotic heart disease of native coronary artery without angina pectoris: Secondary | ICD-10-CM | POA: Diagnosis present

## 2020-01-09 DIAGNOSIS — Z1383 Encounter for screening for respiratory disorder NEC: Secondary | ICD-10-CM | POA: Diagnosis not present

## 2020-01-09 DIAGNOSIS — I248 Other forms of acute ischemic heart disease: Secondary | ICD-10-CM | POA: Diagnosis present

## 2020-01-09 DIAGNOSIS — T82898A Other specified complication of vascular prosthetic devices, implants and grafts, initial encounter: Secondary | ICD-10-CM | POA: Diagnosis not present

## 2020-01-09 DIAGNOSIS — J9601 Acute respiratory failure with hypoxia: Secondary | ICD-10-CM | POA: Diagnosis not present

## 2020-01-09 DIAGNOSIS — D509 Iron deficiency anemia, unspecified: Secondary | ICD-10-CM | POA: Diagnosis not present

## 2020-01-09 DIAGNOSIS — D696 Thrombocytopenia, unspecified: Secondary | ICD-10-CM | POA: Diagnosis not present

## 2020-01-09 DIAGNOSIS — J810 Acute pulmonary edema: Secondary | ICD-10-CM | POA: Diagnosis not present

## 2020-01-09 DIAGNOSIS — K219 Gastro-esophageal reflux disease without esophagitis: Secondary | ICD-10-CM | POA: Diagnosis not present

## 2020-01-09 DIAGNOSIS — I480 Paroxysmal atrial fibrillation: Secondary | ICD-10-CM | POA: Diagnosis not present

## 2020-01-09 DIAGNOSIS — I4891 Unspecified atrial fibrillation: Secondary | ICD-10-CM | POA: Diagnosis not present

## 2020-01-09 DIAGNOSIS — I132 Hypertensive heart and chronic kidney disease with heart failure and with stage 5 chronic kidney disease, or end stage renal disease: Secondary | ICD-10-CM | POA: Diagnosis not present

## 2020-01-09 DIAGNOSIS — I9589 Other hypotension: Secondary | ICD-10-CM | POA: Diagnosis not present

## 2020-01-09 DIAGNOSIS — A529 Late syphilis, unspecified: Secondary | ICD-10-CM | POA: Diagnosis not present

## 2020-01-09 DIAGNOSIS — M2550 Pain in unspecified joint: Secondary | ICD-10-CM | POA: Diagnosis not present

## 2020-01-09 DIAGNOSIS — N2581 Secondary hyperparathyroidism of renal origin: Secondary | ICD-10-CM | POA: Diagnosis not present

## 2020-01-09 DIAGNOSIS — E8770 Fluid overload, unspecified: Secondary | ICD-10-CM | POA: Diagnosis not present

## 2020-01-09 DIAGNOSIS — R0789 Other chest pain: Secondary | ICD-10-CM | POA: Diagnosis not present

## 2020-01-09 DIAGNOSIS — Z8616 Personal history of COVID-19: Secondary | ICD-10-CM | POA: Diagnosis not present

## 2020-01-09 DIAGNOSIS — I482 Chronic atrial fibrillation, unspecified: Secondary | ICD-10-CM | POA: Diagnosis present

## 2020-01-09 DIAGNOSIS — M109 Gout, unspecified: Secondary | ICD-10-CM | POA: Diagnosis present

## 2020-01-09 DIAGNOSIS — Z20828 Contact with and (suspected) exposure to other viral communicable diseases: Secondary | ICD-10-CM | POA: Diagnosis not present

## 2020-01-09 DIAGNOSIS — I639 Cerebral infarction, unspecified: Secondary | ICD-10-CM | POA: Diagnosis not present

## 2020-01-09 DIAGNOSIS — Z7401 Bed confinement status: Secondary | ICD-10-CM | POA: Diagnosis not present

## 2020-01-09 DIAGNOSIS — R079 Chest pain, unspecified: Secondary | ICD-10-CM | POA: Diagnosis not present

## 2020-01-09 DIAGNOSIS — U071 COVID-19: Secondary | ICD-10-CM | POA: Diagnosis not present

## 2020-01-09 DIAGNOSIS — Y712 Prosthetic and other implants, materials and accessory cardiovascular devices associated with adverse incidents: Secondary | ICD-10-CM | POA: Diagnosis present

## 2020-01-09 DIAGNOSIS — B20 Human immunodeficiency virus [HIV] disease: Secondary | ICD-10-CM | POA: Diagnosis not present

## 2020-01-09 DIAGNOSIS — D539 Nutritional anemia, unspecified: Secondary | ICD-10-CM | POA: Diagnosis present

## 2020-01-09 DIAGNOSIS — M545 Low back pain: Secondary | ICD-10-CM | POA: Diagnosis present

## 2020-01-09 DIAGNOSIS — I5042 Chronic combined systolic (congestive) and diastolic (congestive) heart failure: Secondary | ICD-10-CM | POA: Diagnosis not present

## 2020-01-09 DIAGNOSIS — I5043 Acute on chronic combined systolic (congestive) and diastolic (congestive) heart failure: Secondary | ICD-10-CM | POA: Diagnosis not present

## 2020-01-09 DIAGNOSIS — C61 Malignant neoplasm of prostate: Secondary | ICD-10-CM | POA: Diagnosis not present

## 2020-01-09 LAB — CBC WITH DIFFERENTIAL/PLATELET
Abs Immature Granulocytes: 0.01 10*3/uL (ref 0.00–0.07)
Basophils Absolute: 0 10*3/uL (ref 0.0–0.1)
Basophils Relative: 0 %
Eosinophils Absolute: 0.2 10*3/uL (ref 0.0–0.5)
Eosinophils Relative: 5 %
HCT: 27 % — ABNORMAL LOW (ref 39.0–52.0)
Hemoglobin: 8.5 g/dL — ABNORMAL LOW (ref 13.0–17.0)
Immature Granulocytes: 0 %
Lymphocytes Relative: 27 %
Lymphs Abs: 1.1 10*3/uL (ref 0.7–4.0)
MCH: 32.7 pg (ref 26.0–34.0)
MCHC: 31.5 g/dL (ref 30.0–36.0)
MCV: 103.8 fL — ABNORMAL HIGH (ref 80.0–100.0)
Monocytes Absolute: 0.5 10*3/uL (ref 0.1–1.0)
Monocytes Relative: 12 %
Neutro Abs: 2.3 10*3/uL (ref 1.7–7.7)
Neutrophils Relative %: 56 %
Platelets: 160 10*3/uL (ref 150–400)
RBC: 2.6 MIL/uL — ABNORMAL LOW (ref 4.22–5.81)
RDW: 18.9 % — ABNORMAL HIGH (ref 11.5–15.5)
WBC: 4 10*3/uL (ref 4.0–10.5)
nRBC: 0 % (ref 0.0–0.2)

## 2020-01-09 LAB — COMPREHENSIVE METABOLIC PANEL
ALT: 39 U/L (ref 0–44)
AST: 23 U/L (ref 15–41)
Albumin: 2.3 g/dL — ABNORMAL LOW (ref 3.5–5.0)
Alkaline Phosphatase: 139 U/L — ABNORMAL HIGH (ref 38–126)
Anion gap: 10 (ref 5–15)
BUN: 23 mg/dL (ref 8–23)
CO2: 31 mmol/L (ref 22–32)
Calcium: 8.7 mg/dL — ABNORMAL LOW (ref 8.9–10.3)
Chloride: 98 mmol/L (ref 98–111)
Creatinine, Ser: 4.41 mg/dL — ABNORMAL HIGH (ref 0.61–1.24)
GFR calc Af Amer: 14 mL/min — ABNORMAL LOW (ref >60)
GFR calc non Af Amer: 12 mL/min — ABNORMAL LOW (ref >60)
Glucose, Bld: 143 mg/dL — ABNORMAL HIGH (ref 70–99)
Potassium: 4 mmol/L (ref 3.5–5.1)
Sodium: 139 mmol/L (ref 135–145)
Total Bilirubin: 0.5 mg/dL (ref 0.3–1.2)
Total Protein: 5.3 g/dL — ABNORMAL LOW (ref 6.5–8.1)

## 2020-01-09 LAB — MAGNESIUM: Magnesium: 1.7 mg/dL (ref 1.7–2.4)

## 2020-01-09 LAB — PHOSPHORUS: Phosphorus: 2.3 mg/dL — ABNORMAL LOW (ref 2.5–4.6)

## 2020-01-09 LAB — BRAIN NATRIURETIC PEPTIDE: B Natriuretic Peptide: 641.5 pg/mL — ABNORMAL HIGH (ref 0.0–100.0)

## 2020-01-09 LAB — GLUCOSE, CAPILLARY
Glucose-Capillary: 92 mg/dL (ref 70–99)
Glucose-Capillary: 103 mg/dL — ABNORMAL HIGH (ref 70–99)
Glucose-Capillary: 154 mg/dL — ABNORMAL HIGH (ref 70–99)

## 2020-01-09 LAB — PROTIME-INR
INR: 2.3 — ABNORMAL HIGH (ref 0.8–1.2)
Prothrombin Time: 24.8 seconds — ABNORMAL HIGH (ref 11.4–15.2)

## 2020-01-09 LAB — C-REACTIVE PROTEIN: CRP: 5.2 mg/dL — ABNORMAL HIGH (ref <1.0)

## 2020-01-09 LAB — D-DIMER, QUANTITATIVE: D-Dimer, Quant: 2.57 ug/mL-FEU — ABNORMAL HIGH (ref 0.00–0.50)

## 2020-01-09 MED ORDER — TRAMADOL HCL 50 MG PO TABS: 50.0000 mg | ORAL_TABLET | Freq: Four times a day (QID) | ORAL | 0 refills | Status: DC | PRN

## 2020-01-09 MED ORDER — WARFARIN SODIUM 5 MG PO TABS
5.0000 mg | ORAL_TABLET | Freq: Once | ORAL | Status: AC
  Administered 2020-01-09: 5 mg via ORAL
  Filled 2020-01-09: qty 1

## 2020-01-09 NOTE — Progress Notes (Signed)
Discharged patient to [facility] via [facility], AVS given on report. All necessary discharge papers placed in a packet for the patient.

## 2020-01-09 NOTE — TOC Transition Note (Addendum)
Transition of Care [facility]) - CM/SW Discharge Note   Patient Details  Name: David [name] MRN: [id number] Date of Birth: 11-29-1941  [facility] CM/SW Contact:  Alexander Mt, [place] Phone Number: 01/09/2020, 12:37 PM   Clinical Narrative:    Pt cleared by Dr. Candiss Norse, paperwork complete. Prescription complete and DNR done on chart. PTAR papers completed. PTAR called for 3:00pm pick up.    Final next level of care: Skilled Nursing Facility Barriers to Discharge: Barriers Resolved   Patient Goals and CMS Choice   CMS Medicare.gov Compare Post Acute Care list provided to:: Patient Represenative (must comment)(spouse [name]) Choice offered to / list presented to : Spouse  Discharge Placement   Existing PASRR number confirmed : 12/22/19          Patient chooses bed at: [facility] Patient to be transferred to facility by: [facility] Name of family member notified: pt [name] via telephone Patient and family notified of of transfer: 01/09/20  Discharge Plan and Services Post Acute Care Choice: [facility]            Readmission Risk Interventions Readmission Risk Prevention Plan 01/09/2020 09/02/2019  Transportation Screening Complete Complete  Medication Review [employer]) Referral to Pharmacy Complete  PCP or Specialist appointment within 3-5 days of discharge Not Complete -  PCP/Specialist Appt Not Complete comments discharge to SNF -  [place] or [facility] Not Complete -  [place] or Home Care Consult Pt Refusal Comments discharge to SNF -  SW Recovery Care/Counseling Consult Complete -  Palliative Care Screening Complete Not Applicable  Skilled Nursing Facility Complete -  Some recent data might be hidden

## 2020-01-09 NOTE — Progress Notes (Signed)
ANTICOAGULATION CONSULT NOTE - Follow Up Consult  Pharmacy Consult for Coumadin Indication: atrial fibrillation  Allergies  Allergen Reactions  . Dextromethorphan-Guaifenesin     UNKNOWN   . Tocotrienols     UNKNOWN   . Losartan Potassium Other (See Comments)    Causes constipation    Patient Measurements: Height: 6\' 2"  (188 cm) Weight: 175 lb 0.7 oz (79.4 kg) IBW/kg (Calculated) : 82.2  Vital Signs: Temp: 97.7 F (36.5 C) (02/05 0435) Temp Source: Oral (02/05 0435) BP: 112/62 (02/05 0435) Pulse Rate: 84 (02/05 0435)  Labs: Recent Labs    01/07/20 0229 01/07/20 0229 01/08/20 0117 01/08/20 1032 01/09/20 0222  HGB 8.3*   < >  --  9.2* 8.5*  HCT 26.8*  --   --  29.2* 27.0*  PLT 128*  --   --  145* 160  LABPROT 27.8*  --  25.1*  --  24.8*  INR 2.6*  --  2.3*  --  2.3*  CREATININE 7.12*  --   --  6.19* 4.41*   < > = values in this interval not displayed.    Estimated Creatinine Clearance: 15.8 mL/min (A) (by C-G formula based on SCr of 4.41 mg/dL (H)).  Assessment:  Anticoag: Warfarin for hx afib (CHADS2VASc = 8)- d-dimer 2.57 (last 2/5). Warfarin PTA 2.5mg  MF, 5mg  AOD (INR 1.2 on admit); on amiodarone also PTA.   INR 2.3 today; Currently at goal (2-3).   Will give a dose of 5 mg today, and continue to monitior. No signs or symptoms of bleeding noted. Patient is also on amiodarone which can impact INR levels.   Goal of Therapy:  INR 2-3 Monitor platelets by anticoagulation protocol: Yes   Plan:  Give Warfarin 5mg  today, Daily INR / CBC    David [name] A. Levada Dy, PharmD, BCPS, FNKF Clinical Pharmacist [facility] Please utilize Amion for appropriate phone number to reach the unit pharmacist ([place])

## 2020-01-09 NOTE — Plan of Care (Signed)
  Problem: Clinical Measurements: Goal: Ability to maintain clinical measurements within normal limits will improve Outcome: Progressing   Problem: Nutrition: Goal: Adequate nutrition will be maintained Outcome: Progressing   Problem: Fluid Volume: Goal: Compliance with measures to maintain balanced fluid volume will improve Outcome: Progressing

## 2020-01-09 NOTE — Progress Notes (Signed)
Attempted report for the third time, unfortunately Receiving RN is unavailable. This RN's number given for them to call back.

## 2020-01-09 NOTE — Discharge Summary (Signed)
[facility]:[id number] DOB: March 08, 1941 DOA: 12/11/2019  PCP: [name], NP  Admit date: 12/11/2019  Discharge date: 01/09/2020  Admitted From: Home  Disposition:  SNF   Recommendations for Outpatient Follow-up:   Follow up with PCP in 1-2 weeks  PCP Please obtain BMP/CBC, 2 view CXR in 1week,  (see Discharge instructions)   PCP Please follow up on the following pending results:    Home Health: None   Equipment/Devices: None  Consultations: Renal, Cards Discharge Condition: Stable    CODE STATUS: DNR   Diet Recommendation: Renal - Low Carb  CC - Cough   Brief history of present illness from the day of admission and additional interim summary    79 year old male with past medical history of end-stage renal disease on hemodialysis, 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 During the hospitalization, patient was not started on any treatment for Covid as he was not hypoxic however, subsequently his respiratory rate increased and CRP was 22.9 and was subsequently started on dexamethasone and remdesivir on 12/12/2019 He was found to have elevated troponin: Cardiology was consulted.  Cardiology deemed the patient not a candidate for cardiac intervention and troponin elevation was secondary to demand ischemia and no therapies recommended. Nephrology was consulted as well. Palliative Care was consulted and subsequently patient was made a DNR. Hospital  course complicated by ongoing failure to thrive, poor p.o. intake, palliative care conversations ongoing; finally with improving oral intake and now plan for SNF placement with palliative care follow-up. He is medically stable for discharge to SNF, awaiting bed.  Completed Covid isolation on 01/01/2020.                                                                 Hospital Course     COVID-19 virus infection  -Tested positive on 12/11/2019 -Has treatment with remdesivir and steroids and currently symptom-free on room air.  Currently on room air  -Completed Covid isolation on 01/01/2020   SpO2: 99 %  Recent Labs  Lab 01/08/20 1032 01/09/20 0222  CRP 6.6* 5.2*  DDIMER 2.85* 2.57*  BNP 890.8* 641.5*    Hepatic Function Latest Ref Rng & Units 01/09/2020 01/03/2020 12/31/2019  Total Protein 6.5 - 8.1 g/dL 5.3(L) - -  Albumin 3.5 - 5.0 g/dL 2.3(L) 2.2(L) 2.3(L)  AST 15 - 41 U/L 23 - -  ALT 0 - 44 U/L 39 - -  Alk Phosphatase 38 - 126 U/L 139(H) - -  Total Bilirubin 0.3 - 1.2  mg/dL 0.5 - -  Bilirubin, Direct 0.0 - 0.3 mg/dL - - -    Nausea and vomiting  - Most likely from Covid infection and uremia.  Resolved  Elevated troponin in a patient with extensive cardiac history End-stage heart failure, systolic and diastolic combined CHF History of PEA arrest -EF of 20% -Troponin mildly elevated during admission but did not trend up.  EKG unchanged with baseline LBBB -Cardiology was consulted-no indication for cardiac intervention due to end-stage heart failure and recommending optimizing volume status with hemodialysis   End-stage renal disease on hemodialysis Hyperkalemia: Resolved -Nephrology following.  Dialysis as per nephrology schedule.  Continue midodrine on dialysis days.  Chronic hypotension -Continue midodrine on dialysis days  Anemia of chronic disease/microcytic anemia -Hemoglobin stable.  Monitor intermittently.  Patient received a dose of packed red cells on 01/05/2020  as per nephrology recommendations.  Thrombocytopenia -Mild, Questionable cause.  Monitor intermittently.  No signs of bleeding  Paroxysmal A. fib  -on Coumadin and amiodarone.  Currently rate controlled.  Monitor INR.  Lab Results  Component Value Date   INR 2.3 (H) 01/09/2020   INR 2.3 (H) 01/08/2020   INR 2.6 (H) 01/07/2020     Chronic pain -Continue with tramadol  History of HIV -Stable.  Continue [name].  Last CD4 count was 413 in 09/2019 with undetectable viral load  Prediabetes -Continue SSI  Stage II pressure injury right buttock: Not present on admission Stage II pressure injury on scrotum: Not present on admission -Continue wound care   Ambulatory dysfunction, Generalized deconditioning -PT/OT recommending SNF.  On exam on 01/07/2020 it appears that his left lower extremity is slightly weaker, patient says it has been present for several days but unsure when it started, called wife and she could not provide any specific details either.  Discussed with PT as well to reevaluate and reassess, MRI brain shows old left-sided CVA which is not consistent with the site of his weakness, L-spine MRI stable.    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  Discharge diagnosis     Active Problems:   COVID-19   Combined systolic and diastolic heart failure (HCC)   ESRD on dialysis ([place])   DNR (do not resuscitate)   Pressure injury of skin    Discharge instructions    Discharge Instructions    Discharge instructions   Complete by: As directed    Follow with Primary MD [name], NP in 7 days   Get CBC, CMP, INR, 2 view Chest X ray -  checked next visit within 2-3 days by Primary MD or SNF MD     Activity: As tolerated with Full fall precautions use walker/cane & assistance as needed  Disposition SNF  Diet: Renal-Low Carb , with feeding assistance and aspiration precautions.  Accuchecks 4 times/day, Once in AM empty stomach and then before each meal. Log in all results and show them to your Prim.MD in 3 days. If any glucose reading is under 80 or above 300 call your Prim MD immidiately. Follow Low glucose instructions for glucose under 80 as instructed.   Special Instructions: If you have smoked or chewed Tobacco  in the last 2 yrs please stop smoking, stop any regular Alcohol  and or any Recreational  drug use.  On your next visit with your primary care physician please Get Medicines reviewed and adjusted.  Please request your Prim.MD to go over all Hospital Tests and Procedure/Radiological results at the follow up, please get all Hospital records sent to your Prim MD by signing hospital release before you go home.  If you experience worsening of your admission symptoms, develop shortness of breath, life threatening emergency, suicidal or homicidal thoughts you must seek medical attention immediately by calling 911 or calling your MD immediately  if symptoms less severe.  You Must read complete instructions/literature along with all the possible adverse reactions/side effects for all the Medicines you take and that have been prescribed to you. Take any new Medicines after you have completely understood and accpet all the possible adverse reactions/side effects.   Increase activity slowly   Complete by: As directed       Discharge Medications   Allergies as of 01/09/2020      Reactions   Dextromethorphan-guaifenesin    UNKNOWN    Tocotrienols    UNKNOWN    Losartan Potassium Other (See Comments)   Causes constipation      Medication List    TAKE these medications   acetaminophen 500 MG tablet Commonly known as: TYLENOL Take 1,000 mg by mouth every 6 (six) hours as  needed for moderate pain.   allopurinol 100 MG tablet Commonly known as: ZYLOPRIM 1 tablet by mouth 3x weekly after dialysis.   amiodarone 200 MG tablet Commonly known as: PACERONE Take 1 tablet (200 mg total) by mouth daily.   [name] 50-200-25 MG Tabs tablet Generic drug: bictegravir-emtricitabine-tenofovir AF Take 1 tablet by mouth daily.   brimonidine 0.15 % ophthalmic solution Commonly known as: ALPHAGAN Place 1 drop into the left eye 2 (two) times daily.   dorzolamide-timolol 22.3-6.8 MG/ML ophthalmic solution Commonly known as: COSOPT Place 1 drop into the left eye 2 (two) times daily.   latanoprost 0.005 % ophthalmic solution Commonly known as: XALATAN Place 1 drop into the left eye at bedtime.   loratadine 10 MG tablet Commonly known as: CLARITIN Take 10 mg by mouth daily.   midodrine 10 MG tablet Commonly known as: PROAMATINE Take 1 tablet (10 mg total) by mouth as directed. Twice a day on the day of HD Tuesday, Thursday, Saturday (1 in AM and 1 at [facility]). Take 1/2 tablet (8m) a day on non HD days What changed:   when to take this  additional instructions   montelukast 10 MG tablet Commonly known as: SINGULAIR Take 1 tablet (10 mg total) by mouth at bedtime.   multivitamin Tabs tablet Take 1 tablet by mouth daily at 2 PM.   traMADol 50 MG tablet Commonly known as: Ultram Take 1 tablet (50 mg total) by mouth every 6 (six) hours as needed.   Velphoro 500 MG chewable tablet Generic drug: sucroferric oxyhydroxide Chew 500 mg by mouth 3 (three) times daily with meals.   warfarin 5 MG tablet Commonly known as: COUMADIN Take as directed. If you are unsure how to take this medication, talk to your nurse or doctor. Original instructions: TAKE 1/2 TO 1 TABLET BY MOUTH DAILY AS DIRECTED BY COUMADIN CLINIC What changed: See the new instructions.        Contact information for follow-up providers    ODebbrah Alar NP. Schedule an appointment as  soon as possible for a visit in 1 week(s).   Specialty: Internal Medicine Contact information: [address]  [place] [address] [phone]        Tommy Medal, Lavell Islam, MD .   Specialty: Infectious Diseases Contact information: [address] [place] [address] [phone]        Croitoru, Dani Gobble, MD. Schedule an appointment as soon as possible for a visit in 1 week(s).   Specialty: Cardiology Contact information: [address] [place] [address] [phone]            Contact information for after-discharge care    [facility] .   Service: Skilled Nursing Contact information: [place] [phone]                  Major procedures and Radiology Reports - PLEASE review detailed and final reports thoroughly  -      MR BRAIN WO CONTRAST  Result Date: 01/08/2020 CLINICAL DATA:  79 year old male with left extremity weakness, generalized deconditioning. EXAM: MRI HEAD WITHOUT CONTRAST TECHNIQUE: Multiplanar, multiecho pulse sequences of the brain and surrounding structures were obtained without intravenous contrast. COMPARISON:  Brain MRI 11/11/2017.  Head CT 09/05/2019. FINDINGS: Brain: No restricted diffusion or evidence of acute infarction. Tiny chronic lacunar infarcts in the right cerebellum are stable. Mild for age T2 heterogeneity in the pons and more moderate for age bilateral cerebral white matter T2 and FLAIR hyperintensity. There is a small area of chronic cortical encephalomalacia in the left superior parietal lobe including some involvement of the sensory strip (series 11, image 20). Stable cerebral volume since 2018. Mild nonspecific ventriculomegaly, stable, and the temporal horns remain relatively diminutive. No midline shift, mass effect, evidence of mass lesion, extra-axial collection or acute intracranial hemorrhage. Cervicomedullary  junction and pituitary are within normal limits. The deep gray matter nuclei remain within normal limits. No new signal changes identified since 2018. Vascular: Major intracranial vascular flow voids are stable since 2018. Skull and upper cervical spine: Negative for age visible cervical spine. Visualized bone marrow signal is within normal limits. Sinuses/Orbits: Mucosal thickening stable and negative orbits. Mild paranasal is stable sinus. Other: Mastoid air cells remain clear. Visible internal auditory structures appear normal. IMPRESSION: 1. No acute intracranial abnormality. Stable MRI appearance of the brain since 2018. 2. Small chronic posterior left MCA territory infarct, and evidence of chronic small vessel disease in the cerebral white matter and cerebellum. 3. Mild ventriculomegaly is chronic and probably ex vacuo in nature. Electronically Signed   By: Genevie Ann M.D.   On: 01/08/2020 09:08   MR LUMBAR SPINE WO CONTRAST  Result Date: 01/08/2020 CLINICAL DATA:  Lumbar radiculopathy. EXAM: MRI LUMBAR SPINE WITHOUT CONTRAST TECHNIQUE: Multiplanar, multisequence MR imaging of the lumbar spine was performed. No intravenous contrast was administered. COMPARISON:  Correlation made with CT of the abdomen performed March 15, 2019 and Apr 30, 1999. FINDINGS: Segmentation:  Standard. Alignment:  Physiologic. Vertebrae: Radiation induced fatty marrow replacement is noted in the sacrum and L5. Focal lesions in the L4 and L5 vertebral bodies with predominantly low signal on T1 and T2 and increased signal on STIR correspond to a sclerotic lesion seen on prior CT. Conus medullaris and cauda equina: Conus extends to the L1 level. Conus and cauda equina appear normal. Paraspinal and other soft tissues: Negative. Disc levels: L1-2: Mild facet degenerative changes without spinal canal or neural foraminal stenosis. L2-3: Small symmetric disc bulge, facet degenerative changes and ligamentum flavum hypertrophy resulting in mild  left neural foraminal stenosis.  No significant neural. L3-4: Disc bulge with superimposed central disc protrusion, facet degenerative changes flavum resulting in mild neural. No significant spinal stenosis. L4-5: Disc bulge, facet degenerative resulting mild bilateral neural foraminal narrowing and mild spinal stenosis with obliteration of the subarticular zones. L5-S1: Facet degenerative changes without significant spinal canal or neural stenosis. IMPRESSION: 1. Mild degenerative changes of lumbar spine pronounced at L4-5 where there is mild spinal canal stenosis with obliteration the subarticular zones and mild bilateral neural foraminal stenosis. 2. Focal lesions in the L4 and L5 vertebral bodies appear stable in size since 2015. Electronically Signed   By: Pedro Earls M.D.   On: 01/08/2020 09:59   DG CHEST PORT 1 VIEW  Result Date: 12/24/2019 CLINICAL DATA:  Shortness of breath, dialysis EXAM: PORTABLE CHEST 1 VIEW COMPARISON:  12/23/2019 FINDINGS: Cardiomegaly. Large or right chest multi lumen vascular catheter. Both lungs are clear. The visualized skeletal structures are unremarkable. IMPRESSION: Cardiomegaly without acute abnormality of the lungs in AP portable projection. Electronically Signed   By: Eddie Candle M.D.   On: 12/24/2019 08:25   DG CHEST PORT 1 VIEW  Result Date: 12/23/2019 CLINICAL DATA:  Tremors, shortness of breath EXAM: PORTABLE CHEST 1 VIEW COMPARISON:  12/18/2019 FINDINGS: Improving bilateral lower lung hazy airspace disease. No pleural effusion or pneumothorax. Stable cardiomegaly. Large bore right jugular central venous catheter with the tip projecting over the cavoatrial junction. No acute osseous abnormality. IMPRESSION: Stable cardiomegaly. Improving bilateral lower lung hazy airspace disease. Electronically Signed   By: Kathreen Devoid   On: 12/23/2019 08:58   DG CHEST PORT 1 VIEW  Result Date: 12/18/2019 CLINICAL DATA:  Shortness of breath. Covid-19  positive. EXAM: PORTABLE CHEST 1 VIEW COMPARISON:  12/11/2019 FINDINGS: RIGHT-sided dialysis catheter tip overlies the UPPER RIGHT atrium. The heart is enlarged but stable in configuration. There are faint patchy opacities within the lungs bilaterally but there has been some improvement in aeration. No new consolidations. No pleural effusions or pulmonary edema. IMPRESSION: 1. Stable cardiomegaly. 2. Improving patchy opacities bilaterally. Electronically Signed   By: Nolon Nations M.D.   On: 12/18/2019 09:02   DG Chest Portable 1 View  Result Date: 12/11/2019 CLINICAL DATA:  Weakness. Additional history provided: Weakness/shortness of breath since yesterday. EXAM: PORTABLE CHEST 1 VIEW COMPARISON:  Chest radiograph 11/30/2019 FINDINGS: Unchanged position of a right IJ approach central venous catheter terminating in the upper right atrium. Overlying cardiac monitoring leads. Unchanged cardiomegaly. Again demonstrated is bilateral interstitial prominence. Ill-defined airspace opacities within the lung bases, increased on the left as compared to prior examination. No sizable pleural effusion or evidence of pneumothorax. No acute bony abnormality. IMPRESSION: Redemonstrated interstitial prominence. Ill-defined airspace opacities within the lung bases, increased on the left as compared to prior examination 11/30/2019. Findings may reflect edema and/or pneumonia. Unchanged cardiomegaly. Electronically Signed   By: Kellie Simmering DO   On: 12/11/2019 11:28   VAS [place] LOWER EXTREMITY VENOUS (DVT)  Result Date: 12/17/2019  Lower Venous Study Indications: Swelling, and Covid positive.  Limitations: Body habitus, poor ultrasound/tissue interface and restricted mobility. Performing Technologist: Maudry Mayhew MHA, RDMS, RVT, RDCS  Examination Guidelines: A complete evaluation includes B-mode imaging, spectral Doppler, color Doppler, and power Doppler as needed of all accessible portions of each vessel. Bilateral  testing is considered an integral part of a complete examination. Limited examinations for reoccurring indications may be performed as noted.  +---------+---------------+---------+-----------+----------+--------------+ RIGHT    CompressibilityPhasicitySpontaneityPropertiesThrombus Aging +---------+---------------+---------+-----------+----------+--------------+ CFV  Full           Yes      Yes                                 +---------+---------------+---------+-----------+----------+--------------+ SFJ      Full                                                        +---------+---------------+---------+-----------+----------+--------------+ FV Prox  Full                                                        +---------+---------------+---------+-----------+----------+--------------+ FV Mid   Full                                                        +---------+---------------+---------+-----------+----------+--------------+ FV DistalFull                                                        +---------+---------------+---------+-----------+----------+--------------+ PFV      Full                                                        +---------+---------------+---------+-----------+----------+--------------+ POP      Full           Yes      Yes                                 +---------+---------------+---------+-----------+----------+--------------+ PTV      Full                                                        +---------+---------------+---------+-----------+----------+--------------+ PERO     Full                                                        +---------+---------------+---------+-----------+----------+--------------+   +---------+---------------+---------+-----------+----------+--------------+ LEFT     CompressibilityPhasicitySpontaneityPropertiesThrombus Aging  +---------+---------------+---------+-----------+----------+--------------+ CFV      Full           Yes      Yes                                 +---------+---------------+---------+-----------+----------+--------------+  SFJ      Full                                                        +---------+---------------+---------+-----------+----------+--------------+ FV Prox  Full                                                        +---------+---------------+---------+-----------+----------+--------------+ FV Mid   Full                                                        +---------+---------------+---------+-----------+----------+--------------+ FV DistalFull                                                        +---------+---------------+---------+-----------+----------+--------------+ PFV      Full                                                        +---------+---------------+---------+-----------+----------+--------------+ POP      Full           Yes      Yes                                 +---------+---------------+---------+-----------+----------+--------------+ PTV      Full                                                        +---------+---------------+---------+-----------+----------+--------------+ PERO     Full                                                        +---------+---------------+---------+-----------+----------+--------------+     Summary: Right: There is no evidence of deep vein thrombosis in the lower extremity. No cystic structure found in the popliteal fossa. Left: There is no evidence of deep vein thrombosis in the lower extremity. No cystic structure found in the popliteal fossa.  *See table(s) above for measurements and observations. Electronically signed by Harold Barban MD on 12/17/2019 at 8:31:22 PM.    Final    VAS [place] UPPER EXTREMITY VENOUS DUPLEX  Result Date: 12/16/2019 UPPER VENOUS STUDY  Indications:  Swelling Limitations: Previous surgeries and bandages. Performing Technologist: Melvin, RVT  Examination Guidelines: A complete evaluation includes B-mode imaging, spectral Doppler, color  Doppler, and power Doppler as needed of all accessible portions of each vessel. Bilateral testing is considered an integral part of a complete examination. Limited examinations for reoccurring indications may be performed as noted.  Right Findings: +----------+------------+---------+-----------+----------+-------------------+ RIGHT     CompressiblePhasicitySpontaneousProperties      Summary       +----------+------------+---------+-----------+----------+-------------------+ IJV           Full       Yes       Yes                                  +----------+------------+---------+-----------+----------+-------------------+ Subclavian    Full                                  Not well visualized +----------+------------+---------+-----------+----------+-------------------+ Axillary      Full       Yes       Yes                                  +----------+------------+---------+-----------+----------+-------------------+ Brachial      Full       Yes       Yes                                  +----------+------------+---------+-----------+----------+-------------------+ Radial        Full                                                      +----------+------------+---------+-----------+----------+-------------------+ Ulnar         Full                                                      +----------+------------+---------+-----------+----------+-------------------+ Cephalic      Full                                     forearm only     +----------+------------+---------+-----------+----------+-------------------+ Basilic       Full       Yes       Yes                                   +----------+------------+---------+-----------+----------+-------------------+ Subclavian vein not well visualized secondary to port bandage.  Left Findings: +----------+------------+---------+-----------+----------+--------------+ LEFT      CompressiblePhasicitySpontaneousProperties   Summary     +----------+------------+---------+-----------+----------+--------------+ IJV           Full       Yes       Yes                             +----------+------------+---------+-----------+----------+--------------+ Subclavian    Full       Yes  Yes                             +----------+------------+---------+-----------+----------+--------------+ Axillary      Full       Yes       Yes                             +----------+------------+---------+-----------+----------+--------------+ Brachial      Full       Yes       Yes                             +----------+------------+---------+-----------+----------+--------------+ Radial        Full                                                 +----------+------------+---------+-----------+----------+--------------+ Ulnar         Full                                                 +----------+------------+---------+-----------+----------+--------------+ Cephalic    Partial                                                +----------+------------+---------+-----------+----------+--------------+ Basilic                                             Not visualized +----------+------------+---------+-----------+----------+--------------+  Summary:  Right: No evidence of deep vein thrombosis in the upper extremity. No evidence of superficial vein thrombosis in the upper extremity.  Left: No evidence of deep vein thrombosis in the upper extremity. No evidence of superficial vein thrombosis in the upper extremity.  *See table(s) above for measurements and observations.  Diagnosing physician: [name] MD  Electronically signed by [name] MD on 12/16/2019 at 4:10:16 PM.    Final     Micro Results     No results found for this or any previous visit (from the past 240 hour(s)).  Today   Subjective    David [name] today has no headache,no chest abdominal pain,no new weakness tingling or numbness, feels much better.    Objective   Blood pressure 112/62, pulse 84, temperature 97.7 F (36.5 C), temperature source Oral, resp. rate 19, height 6' 2" (1.88 m), weight 79.4 kg, SpO2 99 %.   Intake/Output Summary (Last 24 hours) at 01/09/2020 0947 Last data filed at 01/09/2020 0750 Gross per 24 hour  Intake --  Output 1550 ml  Net -1550 ml    Exam Awake Alert,   No new F.N deficits, Chronic L leg weakness - due to hip discomfort, Normal affect [place].AT,PERRAL Supple Neck,No JVD, No cervical lymphadenopathy appriciated.  Symmetrical Chest wall movement, Good air movement bilaterally, CTAB RRR,No Gallops,Rubs or new Murmurs, No Parasternal Heave +ve B.Sounds, Abd Soft, Non tender, No organomegaly appriciated, No rebound -guarding or rigidity.  No Cyanosis, Clubbing or edema, No new Rash or bruise   Data Review   CBC w Diff:  Lab Results  Component Value Date   WBC 4.0 01/09/2020   HGB 8.5 (L) 01/09/2020   HGB 11.1 (L) 03/06/2013   HCT 27.0 (L) 01/09/2020   HCT 34.3 (L) 03/12/2019   HCT 33.8 (L) 03/06/2013   PLT 160 01/09/2020   PLT 177 03/06/2013   LYMPHOPCT 27 01/09/2020   LYMPHOPCT 40.2 03/06/2013   MONOPCT 12 01/09/2020   MONOPCT 11.4 03/06/2013   EOSPCT 5 01/09/2020   EOSPCT 8.7 (H) 03/06/2013   BASOPCT 0 01/09/2020   BASOPCT 0.3 03/06/2013    CMP:  Lab Results  Component Value Date   NA 139 01/09/2020   NA 142 04/24/2018   K 4.0 01/09/2020   CL 98 01/09/2020   CO2 31 01/09/2020   BUN 23 01/09/2020   BUN 31 (H) 04/24/2018   CREATININE 4.41 (H) 01/09/2020   CREATININE 7.71 (H) 03/19/2019   PROT 5.3 (L) 01/09/2020   PROT 7.9 04/24/2018   ALBUMIN 2.3  (L) 01/09/2020   ALBUMIN 5.0 (H) 04/24/2018   BILITOT 0.5 01/09/2020   BILITOT 0.4 04/24/2018   ALKPHOS 139 (H) 01/09/2020   AST 23 01/09/2020   ALT 39 01/09/2020  .   Total Time in preparing paper work, data evaluation and todays exam - 72 minutes  Lala Lund M.D on 01/09/2020 at 9:47 AM  [facility]   Office  [phone]

## 2020-01-09 NOTE — Progress Notes (Signed)
Report given to [facility] RN of [place]

## 2020-01-09 NOTE — Progress Notes (Signed)
Attempted to call report twice, no response. Will try again.

## 2020-01-09 NOTE — Consult Note (Signed)
   [facility] CM Inpatient Consult   01/09/2020  [facility] [place] 1941-01-09 [id number]  Follow up:  Disposition  Chart reviewed for disposition and length of stay. Patient is for [facility] for skilled nursing per inpatient Transition of Care LCSW notes today. Patient with [facility].  No [facility] needs at the facility. Will sign off.  For questions, please contact:  Natividad Brood, RN BSN [place] Liaison  [phone] business mobile phone Toll free office [phone]  Fax number: [phone] [email or web address]

## 2020-01-09 NOTE — Progress Notes (Signed)
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  Found to be Covid +.    PT Comments    Patient received in recliner, agrees to PT session. Patient performed seated LE exercises with min assist. Transfers with mod assist, increased time and effort needed to get fully standing. Ambulated 25 feet with RW and min guard. Cues needed to increase steps length and for safety with use of AD. Patient ambulates at very slow pace. Patient will continue to benefit from skilled PT to improve strength and functional mobility. Motivated to improve.      Follow Up Recommendations  SNF;Supervision/Assistance - 24 hour     Equipment Recommendations  Other (comment)(TBD, has rollator at home)    Recommendations for Other Services       Precautions / Restrictions Precautions Precautions: Fall Restrictions Weight Bearing Restrictions: No    Mobility  Bed Mobility               General bed mobility comments: not assessed patient up in recliner  Transfers Overall transfer level: Needs assistance Equipment used: Rolling walker (2 wheeled) Transfers: Sit to/from Stand Sit to Stand: Mod assist         General transfer comment: Increased time and effort to get fully upright  Ambulation/Gait Ambulation/Gait assistance: Min guard Gait Distance (Feet): 25 Feet Assistive device: Rolling walker (2 wheeled) Gait Pattern/deviations: Step-to pattern;Shuffle;Narrow base of support;Decreased step length  - right;Decreased step length - left;Trunk flexed Gait velocity: decreased   General Gait Details: Cues for increased step length, upright posture, use of AD   Stairs             Wheelchair Mobility    Modified Rankin (Stroke Patients Only)       Balance Overall balance assessment: Needs assistance Sitting-balance support: Feet supported Sitting balance-Leahy Scale: Good     Standing balance support: Bilateral upper extremity supported;During functional activity Standing balance-Leahy Scale: Fair                              Cognition Arousal/Alertness: Awake/alert Behavior During Therapy: Flat affect Overall Cognitive Status: No family/caregiver present to determine baseline cognitive functioning Area of Impairment: Safety/judgement;Problem solving                 Orientation Level: Disoriented to;Time Current Attention Level: Selective   Following Commands: Follows one step commands with increased time Safety/Judgement: Decreased awareness of deficits Awareness: Emergent Problem Solving: Slow processing;Requires verbal cues;Requires tactile cues;Difficulty sequencing General Comments: needing increased time to process multistep commands- overall slow initiation. Presenting with STM deficits as well      Exercises Other Exercises Other Exercises: seated BLE exercises: ap, heel slides, hip abd/add x 10 reps each    General Comments        Pertinent Vitals/Pain Pain Assessment: No/denies pain    Home Living                      Prior Function  PT Goals (current goals can now be found in the care plan section) Acute Rehab PT Goals Patient Stated Goal: "to get my strength back" PT Goal Formulation: With patient Time For Goal Achievement: 01/21/20 Potential to Achieve Goals: Good Progress towards PT goals: Progressing toward goals    Frequency    Min 3X/week      PT Plan Current plan remains appropriate     Co-evaluation              AM-PAC PT "6 Clicks" Mobility   Outcome Measure  Help needed turning from your back to your side while in a flat bed without using bedrails?: A Little Help needed moving from lying on your back to sitting on the side of a flat bed without using bedrails?: A Little Help needed moving to and from a bed to a chair (including a wheelchair)?: A Little Help needed standing up from a chair using your arms (e.g., wheelchair or bedside chair)?: A Lot Help needed to walk in hospital room?: A Little Help needed climbing 3-5 steps with a railing? : Total 6 Click Score: 15    End of Session Equipment Utilized During Treatment: Gait belt Activity Tolerance: Patient tolerated treatment well;Patient limited by fatigue Patient left: in chair;with call bell/phone within reach;with chair alarm set Nurse Communication: Mobility status PT Visit Diagnosis: Other abnormalities of gait and mobility (R26.89);Muscle weakness (generalized) (M62.81);Difficulty in walking, not elsewhere classified (R26.2)     Time: 7543-6067 PT Time Calculation (min) (ACUTE ONLY): 21 min  Charges:  $Gait Training: 8-22 mins                     Kristyn Conetta, PT, GCS 01/09/20,12:25 PM

## 2020-01-09 NOTE — Social Work (Signed)
Clinical Social Worker facilitated patient discharge including contacting patient family and facility to confirm patient discharge plans.  Clinical information faxed to facility and family agreeable with plan.  CSW arranged ambulance transport via [facility] to [facility] RN to call [phone]  with report prior to discharge.  Clinical Social Worker will sign off for now as social work intervention is no longer needed. Please consult [place] again if new need arises.  Westley Hummer, MSW, LCSW Clinical Social Worker

## 2020-01-10 DIAGNOSIS — E8770 Fluid overload, unspecified: Secondary | ICD-10-CM | POA: Diagnosis not present

## 2020-01-10 DIAGNOSIS — N186 End stage renal disease: Secondary | ICD-10-CM | POA: Diagnosis not present

## 2020-01-10 DIAGNOSIS — N2581 Secondary hyperparathyroidism of renal origin: Secondary | ICD-10-CM | POA: Diagnosis not present

## 2020-01-10 DIAGNOSIS — Z992 Dependence on renal dialysis: Secondary | ICD-10-CM | POA: Diagnosis not present

## 2020-01-13 DIAGNOSIS — N186 End stage renal disease: Secondary | ICD-10-CM | POA: Diagnosis not present

## 2020-01-13 DIAGNOSIS — E8770 Fluid overload, unspecified: Secondary | ICD-10-CM | POA: Diagnosis not present

## 2020-01-13 DIAGNOSIS — Z992 Dependence on renal dialysis: Secondary | ICD-10-CM | POA: Diagnosis not present

## 2020-01-13 DIAGNOSIS — N2581 Secondary hyperparathyroidism of renal origin: Secondary | ICD-10-CM | POA: Diagnosis not present

## 2020-01-13 LAB — PROTIME-INR: INR: 2.1 — AB (ref 0.9–1.1)

## 2020-01-15 ENCOUNTER — Ambulatory Visit (INDEPENDENT_AMBULATORY_CARE_PROVIDER_SITE_OTHER): Payer: Medicare HMO | Admitting: Pharmacist

## 2020-01-15 DIAGNOSIS — I480 Paroxysmal atrial fibrillation: Secondary | ICD-10-CM | POA: Diagnosis not present

## 2020-01-15 DIAGNOSIS — Z515 Encounter for palliative care: Secondary | ICD-10-CM

## 2020-01-15 DIAGNOSIS — E8770 Fluid overload, unspecified: Secondary | ICD-10-CM | POA: Diagnosis not present

## 2020-01-15 DIAGNOSIS — N186 End stage renal disease: Secondary | ICD-10-CM | POA: Diagnosis not present

## 2020-01-15 DIAGNOSIS — I4891 Unspecified atrial fibrillation: Secondary | ICD-10-CM | POA: Diagnosis not present

## 2020-01-15 DIAGNOSIS — N2581 Secondary hyperparathyroidism of renal origin: Secondary | ICD-10-CM | POA: Diagnosis not present

## 2020-01-15 DIAGNOSIS — Z7901 Long term (current) use of anticoagulants: Secondary | ICD-10-CM

## 2020-01-15 DIAGNOSIS — Z992 Dependence on renal dialysis: Secondary | ICD-10-CM | POA: Diagnosis not present

## 2020-01-15 DIAGNOSIS — I482 Chronic atrial fibrillation, unspecified: Secondary | ICD-10-CM

## 2020-01-15 DIAGNOSIS — U071 COVID-19: Secondary | ICD-10-CM | POA: Diagnosis not present

## 2020-01-15 DIAGNOSIS — B20 Human immunodeficiency virus [HIV] disease: Secondary | ICD-10-CM | POA: Diagnosis not present

## 2020-01-17 DIAGNOSIS — Z992 Dependence on renal dialysis: Secondary | ICD-10-CM | POA: Diagnosis not present

## 2020-01-17 DIAGNOSIS — N2581 Secondary hyperparathyroidism of renal origin: Secondary | ICD-10-CM | POA: Diagnosis not present

## 2020-01-17 DIAGNOSIS — E8770 Fluid overload, unspecified: Secondary | ICD-10-CM | POA: Diagnosis not present

## 2020-01-17 DIAGNOSIS — N186 End stage renal disease: Secondary | ICD-10-CM | POA: Diagnosis not present

## 2020-01-19 DIAGNOSIS — N2581 Secondary hyperparathyroidism of renal origin: Secondary | ICD-10-CM | POA: Diagnosis not present

## 2020-01-19 DIAGNOSIS — Z992 Dependence on renal dialysis: Secondary | ICD-10-CM | POA: Diagnosis not present

## 2020-01-19 DIAGNOSIS — E8770 Fluid overload, unspecified: Secondary | ICD-10-CM | POA: Diagnosis not present

## 2020-01-19 DIAGNOSIS — N186 End stage renal disease: Secondary | ICD-10-CM | POA: Diagnosis not present

## 2020-01-20 ENCOUNTER — Other Ambulatory Visit: Payer: Self-pay

## 2020-01-20 DIAGNOSIS — N186 End stage renal disease: Secondary | ICD-10-CM | POA: Diagnosis not present

## 2020-01-20 DIAGNOSIS — E8770 Fluid overload, unspecified: Secondary | ICD-10-CM | POA: Diagnosis not present

## 2020-01-20 DIAGNOSIS — N2581 Secondary hyperparathyroidism of renal origin: Secondary | ICD-10-CM | POA: Diagnosis not present

## 2020-01-20 DIAGNOSIS — Z992 Dependence on renal dialysis: Secondary | ICD-10-CM | POA: Diagnosis not present

## 2020-01-20 NOTE — Patient Outreach (Addendum)
[facility]) Care Management  01/20/2020  [facility] [place] [birth year] [id number]     Transition of Care Referral  Referral Date: 01/20/2020 Referral Source: [employer] Discharge Report Date of Discharge: Facility: [facility] Insurance: [employer]    Outreach attempt # 1 to patient. No answer. RN CM left HIPAA compliant voicemail message along with contact info.    Plan: RN CM will make outreach attempt to patient within 3-4 business days. RN CM will send unsuccessful outreach letter to patient.   Enzo Montgomery, RN,BSN,CCM [place] [facility] Telephonic Care Management Coordinator Direct Phone: [phone] Toll Free: [phone] Fax: [phone]

## 2020-01-21 ENCOUNTER — Encounter: Payer: Medicare HMO | Admitting: Infectious Disease

## 2020-01-21 ENCOUNTER — Encounter: Payer: Self-pay | Admitting: Infectious Disease

## 2020-01-21 ENCOUNTER — Other Ambulatory Visit: Payer: Self-pay

## 2020-01-21 ENCOUNTER — Other Ambulatory Visit: Payer: Medicare HMO

## 2020-01-21 DIAGNOSIS — B20 Human immunodeficiency virus [HIV] disease: Secondary | ICD-10-CM

## 2020-01-21 NOTE — Addendum Note (Signed)
Addended by: Monia Sabal on: 01/21/2020 02:17 PM   Modules accepted: Orders

## 2020-01-21 NOTE — Patient Outreach (Signed)
[place] [facility]) Care Management  01/21/2020  [facility] [place] May 02, 1941 [id number]    Transition of Care Referral  Referral Date: 01/20/2020 Referral Source: [employer] Discharge Report Date of Discharge: Facility: [facility] Insurance: [facility]   Outreach attempt #2 to patient. No answer after multiple rings.      Plan: RN CM will make outreach attempt to patient within 3-4 business days.    Enzo Montgomery, RN,BSN,CCM [place] Management Telephonic Care Management Coordinator Direct Phone: [phone] Toll Free: [phone] Fax: [phone]

## 2020-01-22 DIAGNOSIS — N2581 Secondary hyperparathyroidism of renal origin: Secondary | ICD-10-CM | POA: Diagnosis not present

## 2020-01-22 DIAGNOSIS — Z992 Dependence on renal dialysis: Secondary | ICD-10-CM | POA: Diagnosis not present

## 2020-01-22 DIAGNOSIS — E8770 Fluid overload, unspecified: Secondary | ICD-10-CM | POA: Diagnosis not present

## 2020-01-22 DIAGNOSIS — N186 End stage renal disease: Secondary | ICD-10-CM | POA: Diagnosis not present

## 2020-01-22 LAB — T-HELPER CELL (CD4) - (RCID CLINIC ONLY)
CD4 % Helper T Cell: 28 % — ABNORMAL LOW (ref 33–65)
CD4 T Cell Abs: 406 /uL (ref 400–1790)

## 2020-01-23 ENCOUNTER — Other Ambulatory Visit: Payer: Self-pay

## 2020-01-23 ENCOUNTER — Telehealth: Payer: Self-pay

## 2020-01-23 ENCOUNTER — Ambulatory Visit: Payer: Medicare HMO | Admitting: *Deleted

## 2020-01-23 NOTE — Telephone Encounter (Signed)
Patient called in to see

## 2020-01-23 NOTE — Patient Outreach (Signed)
[facility] Care Management  01/23/2020  [facility] [place] [birth year] [id number]   Transition of Care Referral  Referral Date:01/20/2020 Referral Source:[employer] Discharge Report Date of Discharge: Facility:[facility] [place] Medicare    Outreach attempt #3 to patient. No answer at present.    Plan: RN CM will close case if no response from letter mailed to patient.   Enzo Montgomery, RN,BSN,CCM [place] Management Telephonic Care Management Coordinator Direct Phone: [phone] Toll Free: [phone] Fax: [phone]

## 2020-01-24 ENCOUNTER — Other Ambulatory Visit: Payer: Self-pay

## 2020-01-24 ENCOUNTER — Encounter (HOSPITAL_COMMUNITY): Payer: Self-pay

## 2020-01-24 ENCOUNTER — Inpatient Hospital Stay (HOSPITAL_COMMUNITY)
Admission: EM | Admit: 2020-01-24 | Discharge: 2020-01-27 | DRG: 270 | Disposition: A | Discharge status: Skilled Nursing Facility | Payer: Medicare HMO | Source: Other Acute Inpatient Hospital | Attending: Internal Medicine | Admitting: Internal Medicine

## 2020-01-24 ENCOUNTER — Emergency Department (HOSPITAL_COMMUNITY): Payer: Medicare HMO

## 2020-01-24 DIAGNOSIS — I959 Hypotension, unspecified: Secondary | ICD-10-CM | POA: Diagnosis not present

## 2020-01-24 DIAGNOSIS — K219 Gastro-esophageal reflux disease without esophagitis: Secondary | ICD-10-CM | POA: Diagnosis not present

## 2020-01-24 DIAGNOSIS — I447 Left bundle-branch block, unspecified: Secondary | ICD-10-CM | POA: Diagnosis not present

## 2020-01-24 DIAGNOSIS — B20 Human immunodeficiency virus [HIV] disease: Secondary | ICD-10-CM | POA: Diagnosis present

## 2020-01-24 DIAGNOSIS — M545 Low back pain: Secondary | ICD-10-CM | POA: Diagnosis present

## 2020-01-24 DIAGNOSIS — R0602 Shortness of breath: Secondary | ICD-10-CM | POA: Diagnosis not present

## 2020-01-24 DIAGNOSIS — Z79899 Other long term (current) drug therapy: Secondary | ICD-10-CM

## 2020-01-24 DIAGNOSIS — I712 Thoracic aortic aneurysm, without rupture: Secondary | ICD-10-CM | POA: Diagnosis present

## 2020-01-24 DIAGNOSIS — Y832 Surgical operation with anastomosis, bypass or graft as the cause of abnormal reaction of the patient, or of later complication, without mention of misadventure at the time of the procedure: Secondary | ICD-10-CM | POA: Diagnosis present

## 2020-01-24 DIAGNOSIS — Z7901 Long term (current) use of anticoagulants: Secondary | ICD-10-CM

## 2020-01-24 DIAGNOSIS — N2581 Secondary hyperparathyroidism of renal origin: Secondary | ICD-10-CM | POA: Diagnosis present

## 2020-01-24 DIAGNOSIS — N281 Cyst of kidney, acquired: Secondary | ICD-10-CM | POA: Diagnosis not present

## 2020-01-24 DIAGNOSIS — J9601 Acute respiratory failure with hypoxia: Secondary | ICD-10-CM | POA: Diagnosis not present

## 2020-01-24 DIAGNOSIS — R0789 Other chest pain: Secondary | ICD-10-CM | POA: Diagnosis not present

## 2020-01-24 DIAGNOSIS — Y712 Prosthetic and other implants, materials and accessory cardiovascular devices associated with adverse incidents: Secondary | ICD-10-CM | POA: Diagnosis present

## 2020-01-24 DIAGNOSIS — D539 Nutritional anemia, unspecified: Secondary | ICD-10-CM | POA: Diagnosis present

## 2020-01-24 DIAGNOSIS — I9589 Other hypotension: Secondary | ICD-10-CM | POA: Diagnosis present

## 2020-01-24 DIAGNOSIS — D509 Iron deficiency anemia, unspecified: Secondary | ICD-10-CM | POA: Diagnosis not present

## 2020-01-24 DIAGNOSIS — I5042 Chronic combined systolic (congestive) and diastolic (congestive) heart failure: Secondary | ICD-10-CM | POA: Diagnosis not present

## 2020-01-24 DIAGNOSIS — R279 Unspecified lack of coordination: Secondary | ICD-10-CM | POA: Diagnosis not present

## 2020-01-24 DIAGNOSIS — D631 Anemia in chronic kidney disease: Secondary | ICD-10-CM | POA: Diagnosis present

## 2020-01-24 DIAGNOSIS — J810 Acute pulmonary edema: Secondary | ICD-10-CM | POA: Diagnosis not present

## 2020-01-24 DIAGNOSIS — R778 Other specified abnormalities of plasma proteins: Secondary | ICD-10-CM | POA: Diagnosis present

## 2020-01-24 DIAGNOSIS — T82898A Other specified complication of vascular prosthetic devices, implants and grafts, initial encounter: Secondary | ICD-10-CM | POA: Diagnosis not present

## 2020-01-24 DIAGNOSIS — I5084 End stage heart failure: Secondary | ICD-10-CM | POA: Diagnosis present

## 2020-01-24 DIAGNOSIS — I482 Chronic atrial fibrillation, unspecified: Secondary | ICD-10-CM | POA: Diagnosis present

## 2020-01-24 DIAGNOSIS — N186 End stage renal disease: Secondary | ICD-10-CM | POA: Diagnosis present

## 2020-01-24 DIAGNOSIS — D696 Thrombocytopenia, unspecified: Secondary | ICD-10-CM | POA: Diagnosis not present

## 2020-01-24 DIAGNOSIS — R079 Chest pain, unspecified: Secondary | ICD-10-CM | POA: Diagnosis not present

## 2020-01-24 DIAGNOSIS — I10 Essential (primary) hypertension: Secondary | ICD-10-CM | POA: Diagnosis not present

## 2020-01-24 DIAGNOSIS — Z8616 Personal history of COVID-19: Secondary | ICD-10-CM

## 2020-01-24 DIAGNOSIS — M109 Gout, unspecified: Secondary | ICD-10-CM | POA: Diagnosis present

## 2020-01-24 DIAGNOSIS — J9600 Acute respiratory failure, unspecified whether with hypoxia or hypercapnia: Secondary | ICD-10-CM | POA: Diagnosis not present

## 2020-01-24 DIAGNOSIS — A529 Late syphilis, unspecified: Secondary | ICD-10-CM | POA: Diagnosis present

## 2020-01-24 DIAGNOSIS — I5040 Unspecified combined systolic (congestive) and diastolic (congestive) heart failure: Secondary | ICD-10-CM | POA: Diagnosis not present

## 2020-01-24 DIAGNOSIS — E8770 Fluid overload, unspecified: Secondary | ICD-10-CM | POA: Diagnosis not present

## 2020-01-24 DIAGNOSIS — Z8701 Personal history of pneumonia (recurrent): Secondary | ICD-10-CM

## 2020-01-24 DIAGNOSIS — I132 Hypertensive heart and chronic kidney disease with heart failure and with stage 5 chronic kidney disease, or end stage renal disease: Secondary | ICD-10-CM | POA: Diagnosis present

## 2020-01-24 DIAGNOSIS — T82868A Thrombosis of vascular prosthetic devices, implants and grafts, initial encounter: Secondary | ICD-10-CM | POA: Diagnosis not present

## 2020-01-24 DIAGNOSIS — I2510 Atherosclerotic heart disease of native coronary artery without angina pectoris: Secondary | ICD-10-CM | POA: Diagnosis present

## 2020-01-24 DIAGNOSIS — G8929 Other chronic pain: Secondary | ICD-10-CM | POA: Diagnosis present

## 2020-01-24 DIAGNOSIS — Z66 Do not resuscitate: Secondary | ICD-10-CM | POA: Diagnosis present

## 2020-01-24 DIAGNOSIS — Z6821 Body mass index (BMI) 21.0-21.9, adult: Secondary | ICD-10-CM

## 2020-01-24 DIAGNOSIS — Z8674 Personal history of sudden cardiac arrest: Secondary | ICD-10-CM | POA: Diagnosis not present

## 2020-01-24 DIAGNOSIS — I639 Cerebral infarction, unspecified: Secondary | ICD-10-CM | POA: Diagnosis not present

## 2020-01-24 DIAGNOSIS — I428 Other cardiomyopathies: Secondary | ICD-10-CM | POA: Diagnosis present

## 2020-01-24 DIAGNOSIS — Z833 Family history of diabetes mellitus: Secondary | ICD-10-CM

## 2020-01-24 DIAGNOSIS — Z8546 Personal history of malignant neoplasm of prostate: Secondary | ICD-10-CM

## 2020-01-24 DIAGNOSIS — Z9842 Cataract extraction status, left eye: Secondary | ICD-10-CM

## 2020-01-24 DIAGNOSIS — I248 Other forms of acute ischemic heart disease: Secondary | ICD-10-CM | POA: Diagnosis present

## 2020-01-24 DIAGNOSIS — Z992 Dependence on renal dialysis: Secondary | ICD-10-CM

## 2020-01-24 DIAGNOSIS — Z87891 Personal history of nicotine dependence: Secondary | ICD-10-CM

## 2020-01-24 DIAGNOSIS — Z8673 Personal history of transient ischemic attack (TIA), and cerebral infarction without residual deficits: Secondary | ICD-10-CM

## 2020-01-24 DIAGNOSIS — R7989 Other specified abnormal findings of blood chemistry: Secondary | ICD-10-CM | POA: Diagnosis present

## 2020-01-24 DIAGNOSIS — I5043 Acute on chronic combined systolic (congestive) and diastolic (congestive) heart failure: Secondary | ICD-10-CM | POA: Diagnosis present

## 2020-01-24 DIAGNOSIS — R791 Abnormal coagulation profile: Secondary | ICD-10-CM | POA: Diagnosis present

## 2020-01-24 DIAGNOSIS — Z743 Need for continuous supervision: Secondary | ICD-10-CM | POA: Diagnosis not present

## 2020-01-24 DIAGNOSIS — Z8349 Family history of other endocrine, nutritional and metabolic diseases: Secondary | ICD-10-CM

## 2020-01-24 DIAGNOSIS — Z888 Allergy status to other drugs, medicaments and biological substances status: Secondary | ICD-10-CM

## 2020-01-24 DIAGNOSIS — Z8619 Personal history of other infectious and parasitic diseases: Secondary | ICD-10-CM

## 2020-01-24 DIAGNOSIS — Z923 Personal history of irradiation: Secondary | ICD-10-CM

## 2020-01-24 DIAGNOSIS — Z79891 Long term (current) use of opiate analgesic: Secondary | ICD-10-CM

## 2020-01-24 DIAGNOSIS — Z8249 Family history of ischemic heart disease and other diseases of the circulatory system: Secondary | ICD-10-CM

## 2020-01-24 DIAGNOSIS — E43 Unspecified severe protein-calorie malnutrition: Secondary | ICD-10-CM | POA: Diagnosis present

## 2020-01-24 DIAGNOSIS — I4891 Unspecified atrial fibrillation: Secondary | ICD-10-CM | POA: Diagnosis not present

## 2020-01-24 DIAGNOSIS — Z9841 Cataract extraction status, right eye: Secondary | ICD-10-CM

## 2020-01-24 DIAGNOSIS — C61 Malignant neoplasm of prostate: Secondary | ICD-10-CM | POA: Diagnosis not present

## 2020-01-24 DIAGNOSIS — J811 Chronic pulmonary edema: Secondary | ICD-10-CM | POA: Diagnosis present

## 2020-01-24 DIAGNOSIS — I480 Paroxysmal atrial fibrillation: Secondary | ICD-10-CM | POA: Diagnosis not present

## 2020-01-24 DIAGNOSIS — I120 Hypertensive chronic kidney disease with stage 5 chronic kidney disease or end stage renal disease: Secondary | ICD-10-CM | POA: Diagnosis not present

## 2020-01-24 DIAGNOSIS — Z8379 Family history of other diseases of the digestive system: Secondary | ICD-10-CM

## 2020-01-24 LAB — CBC WITH DIFFERENTIAL/PLATELET
Abs Immature Granulocytes: 0.03 10*3/uL (ref 0.00–0.07)
Basophils Absolute: 0.1 10*3/uL (ref 0.0–0.1)
Basophils Relative: 1 %
Eosinophils Absolute: 0.1 10*3/uL (ref 0.0–0.5)
Eosinophils Relative: 2 %
HCT: 30.4 % — ABNORMAL LOW (ref 39.0–52.0)
Hemoglobin: 9.3 g/dL — ABNORMAL LOW (ref 13.0–17.0)
Immature Granulocytes: 0 %
Lymphocytes Relative: 16 %
Lymphs Abs: 1.2 10*3/uL (ref 0.7–4.0)
MCH: 32.7 pg (ref 26.0–34.0)
MCHC: 30.6 g/dL (ref 30.0–36.0)
MCV: 107 fL — ABNORMAL HIGH (ref 80.0–100.0)
Monocytes Absolute: 0.9 10*3/uL (ref 0.1–1.0)
Monocytes Relative: 13 %
Neutro Abs: 5.2 10*3/uL (ref 1.7–7.7)
Neutrophils Relative %: 68 %
Platelets: 293 10*3/uL (ref 150–400)
RBC: 2.84 MIL/uL — ABNORMAL LOW (ref 4.22–5.81)
RDW: 19.2 % — ABNORMAL HIGH (ref 11.5–15.5)
WBC: 7.5 10*3/uL (ref 4.0–10.5)
nRBC: 0.5 % — ABNORMAL HIGH (ref 0.0–0.2)

## 2020-01-24 LAB — COMPREHENSIVE METABOLIC PANEL
ALT: 16 U/L (ref 0–44)
AST: 19 U/L (ref 15–41)
Albumin: 2.9 g/dL — ABNORMAL LOW (ref 3.5–5.0)
Alkaline Phosphatase: 149 U/L — ABNORMAL HIGH (ref 38–126)
Anion gap: 13 (ref 5–15)
BUN: 20 mg/dL (ref 8–23)
CO2: 29 mmol/L (ref 22–32)
Calcium: 9.6 mg/dL (ref 8.9–10.3)
Chloride: 100 mmol/L (ref 98–111)
Creatinine, Ser: 8.57 mg/dL — ABNORMAL HIGH (ref 0.61–1.24)
GFR calc Af Amer: 6 mL/min — ABNORMAL LOW (ref >60)
GFR calc non Af Amer: 5 mL/min — ABNORMAL LOW (ref >60)
Glucose, Bld: 119 mg/dL — ABNORMAL HIGH (ref 70–99)
Potassium: 3.6 mmol/L (ref 3.5–5.1)
Sodium: 142 mmol/L (ref 135–145)
Total Bilirubin: 0.6 mg/dL (ref 0.3–1.2)
Total Protein: 6.1 g/dL — ABNORMAL LOW (ref 6.5–8.1)

## 2020-01-24 LAB — RPR TITER: RPR Titer: 1:1 {titer} — ABNORMAL HIGH

## 2020-01-24 LAB — RPR: RPR Ser Ql: REACTIVE — AB

## 2020-01-24 LAB — TROPONIN I (HIGH SENSITIVITY)
Troponin I (High Sensitivity): 55 ng/L — ABNORMAL HIGH (ref <18)
Troponin I (High Sensitivity): 70 ng/L — ABNORMAL HIGH (ref <18)
Troponin I (High Sensitivity): 110 ng/L (ref <18)

## 2020-01-24 LAB — HEPARIN LEVEL (UNFRACTIONATED): Heparin Unfractionated: 1.02 IU/mL — ABNORMAL HIGH (ref 0.30–0.70)

## 2020-01-24 LAB — HIV-1 RNA QUANT-NO REFLEX-BLD
HIV 1 RNA Quant: 21 copies/mL — ABNORMAL HIGH
HIV-1 RNA Quant, Log: 1.32 Log copies/mL — ABNORMAL HIGH

## 2020-01-24 LAB — FLUORESCENT TREPONEMAL AB(FTA)-IGG-BLD: Fluorescent Treponemal ABS: REACTIVE — AB

## 2020-01-24 LAB — PROTIME-INR
INR: 1.2 (ref 0.8–1.2)
Prothrombin Time: 15.5 seconds — ABNORMAL HIGH (ref 11.4–15.2)

## 2020-01-24 LAB — LIPASE, BLOOD: Lipase: 33 U/L (ref 11–51)

## 2020-01-24 MED ORDER — ALBUTEROL SULFATE (2.5 MG/3ML) 0.083% IN NEBU: 2.5000 mg | INHALATION_SOLUTION | RESPIRATORY_TRACT | Status: DC | PRN

## 2020-01-24 MED ORDER — DILTIAZEM LOAD VIA INFUSION
15.0000 mg | Freq: Once | INTRAVENOUS | Status: AC
  Administered 2020-01-24: 15 mg via INTRAVENOUS
  Filled 2020-01-24: qty 15

## 2020-01-24 MED ORDER — HEPARIN SODIUM (PORCINE) 1000 UNIT/ML IJ SOLN
INTRAMUSCULAR | Status: AC
  Administered 2020-01-24: 1000 [IU]
  Filled 2020-01-24: qty 1

## 2020-01-24 MED ORDER — ALLOPURINOL 100 MG PO TABS
100.0000 mg | ORAL_TABLET | Freq: Every day | ORAL | Status: DC
  Administered 2020-01-25 – 2020-01-27 (×3): 100 mg via ORAL
  Filled 2020-01-24 (×4): qty 1

## 2020-01-24 MED ORDER — AMIODARONE HCL IN DEXTROSE 360-4.14 MG/200ML-% IV SOLN
30.0000 mg/h | INTRAVENOUS | Status: DC
  Administered 2020-01-24: 30 mg/h via INTRAVENOUS
  Filled 2020-01-24 (×3): qty 200

## 2020-01-24 MED ORDER — BICTEGRAVIR-EMTRICITAB-TENOFOV 50-200-25 MG PO TABS
1.0000 | ORAL_TABLET | Freq: Every day | ORAL | Status: DC
  Administered 2020-01-25 – 2020-01-27 (×2): 1 via ORAL
  Filled 2020-01-24 (×5): qty 1

## 2020-01-24 MED ORDER — SUCRALFATE 1 G PO TABS
1.0000 g | ORAL_TABLET | Freq: Once | ORAL | Status: DC
  Filled 2020-01-24: qty 1

## 2020-01-24 MED ORDER — ONDANSETRON HCL 4 MG PO TABS: 4.0000 mg | ORAL_TABLET | Freq: Four times a day (QID) | ORAL | Status: DC | PRN

## 2020-01-24 MED ORDER — AMIODARONE HCL IN DEXTROSE 360-4.14 MG/200ML-% IV SOLN
60.0000 mg/h | INTRAVENOUS | Status: AC
  Administered 2020-01-24 (×2): 60 mg/h via INTRAVENOUS
  Filled 2020-01-24 (×2): qty 200

## 2020-01-24 MED ORDER — ACETAMINOPHEN 325 MG PO TABS: 650.0000 mg | ORAL_TABLET | Freq: Four times a day (QID) | ORAL | Status: DC | PRN

## 2020-01-24 MED ORDER — HEPARIN (PORCINE) 25000 UT/250ML-% IV SOLN
1100.0000 [IU]/h | INTRAVENOUS | Status: DC
  Administered 2020-01-24: 950 [IU]/h via INTRAVENOUS
  Administered 2020-01-25: 850 [IU]/h via INTRAVENOUS
  Administered 2020-01-26 (×2): 950 [IU]/h via INTRAVENOUS
  Filled 2020-01-24 (×3): qty 250

## 2020-01-24 MED ORDER — DORZOLAMIDE HCL-TIMOLOL MAL 2-0.5 % OP SOLN
1.0000 [drp] | Freq: Two times a day (BID) | OPHTHALMIC | Status: DC
  Administered 2020-01-24 – 2020-01-27 (×6): 1 [drp] via OPHTHALMIC
  Filled 2020-01-24: qty 10

## 2020-01-24 MED ORDER — MIDODRINE HCL 5 MG PO TABS
10.0000 mg | ORAL_TABLET | Freq: Three times a day (TID) | ORAL | Status: DC
  Administered 2020-01-24 – 2020-01-27 (×7): 10 mg via ORAL
  Filled 2020-01-24 (×9): qty 2

## 2020-01-24 MED ORDER — WARFARIN - PHARMACIST DOSING INPATIENT: Freq: Every day | Status: DC

## 2020-01-24 MED ORDER — MONTELUKAST SODIUM 10 MG PO TABS
10.0000 mg | ORAL_TABLET | Freq: Every day | ORAL | Status: DC
  Administered 2020-01-24 – 2020-01-26 (×3): 10 mg via ORAL
  Filled 2020-01-24 (×3): qty 1

## 2020-01-24 MED ORDER — SODIUM CHLORIDE 0.9% FLUSH
3.0000 mL | Freq: Two times a day (BID) | INTRAVENOUS | Status: DC
  Administered 2020-01-24 – 2020-01-27 (×5): 3 mL via INTRAVENOUS

## 2020-01-24 MED ORDER — SUCROFERRIC OXYHYDROXIDE 500 MG PO CHEW
500.0000 mg | CHEWABLE_TABLET | Freq: Three times a day (TID) | ORAL | Status: DC
  Administered 2020-01-25 – 2020-01-27 (×5): 500 mg via ORAL
  Filled 2020-01-24 (×8): qty 1

## 2020-01-24 MED ORDER — ACETAMINOPHEN 650 MG RE SUPP: 650.0000 mg | Freq: Four times a day (QID) | RECTAL | Status: DC | PRN

## 2020-01-24 MED ORDER — LATANOPROST 0.005 % OP SOLN
1.0000 [drp] | Freq: Every day | OPHTHALMIC | Status: DC
  Administered 2020-01-24 – 2020-01-26 (×3): 1 [drp] via OPHTHALMIC
  Filled 2020-01-24: qty 2.5

## 2020-01-24 MED ORDER — AMIODARONE IV BOLUS ONLY 150 MG/100ML: 150.0000 mg | Freq: Once | INTRAVENOUS | Status: DC

## 2020-01-24 MED ORDER — LORATADINE 10 MG PO TABS
10.0000 mg | ORAL_TABLET | Freq: Every day | ORAL | Status: DC
  Administered 2020-01-25 – 2020-01-27 (×3): 10 mg via ORAL
  Filled 2020-01-24 (×5): qty 1

## 2020-01-24 MED ORDER — WARFARIN SODIUM 5 MG PO TABS
5.0000 mg | ORAL_TABLET | Freq: Once | ORAL | Status: AC
  Administered 2020-01-24: 5 mg via ORAL
  Filled 2020-01-24 (×2): qty 1

## 2020-01-24 MED ORDER — ONDANSETRON HCL 4 MG/2ML IJ SOLN: 4.0000 mg | Freq: Four times a day (QID) | INTRAMUSCULAR | Status: DC | PRN

## 2020-01-24 MED ORDER — HEPARIN BOLUS VIA INFUSION
3000.0000 [IU] | Freq: Once | INTRAVENOUS | Status: AC
  Administered 2020-01-24: 3000 [IU] via INTRAVENOUS
  Filled 2020-01-24: qty 3000

## 2020-01-24 MED ORDER — BRIMONIDINE TARTRATE 0.15 % OP SOLN
1.0000 [drp] | Freq: Two times a day (BID) | OPHTHALMIC | Status: DC
  Administered 2020-01-24 – 2020-01-27 (×6): 1 [drp] via OPHTHALMIC
  Filled 2020-01-24: qty 5

## 2020-01-24 MED ORDER — CHLORHEXIDINE GLUCONATE CLOTH 2 % EX PADS
6.0000 | MEDICATED_PAD | Freq: Every day | CUTANEOUS | Status: DC
  Administered 2020-01-25 – 2020-01-26 (×3): 6 via TOPICAL

## 2020-01-24 MED ORDER — DILTIAZEM HCL-DEXTROSE 125-5 MG/125ML-% IV SOLN (PREMIX)
5.0000 mg/h | INTRAVENOUS | Status: DC
  Administered 2020-01-24: 5 mg/h via INTRAVENOUS
  Filled 2020-01-24: qty 125

## 2020-01-24 MED ORDER — RENA-VITE PO TABS
1.0000 | ORAL_TABLET | Freq: Every day | ORAL | Status: DC
  Administered 2020-01-24 – 2020-01-26 (×3): 1 via ORAL
  Filled 2020-01-24 (×3): qty 1

## 2020-01-24 MED ORDER — TRAMADOL HCL 50 MG PO TABS: 50.0000 mg | ORAL_TABLET | Freq: Four times a day (QID) | ORAL | Status: DC | PRN

## 2020-01-24 MED ORDER — AMIODARONE IV BOLUS ONLY 150 MG/100ML
150.0000 mg | Freq: Once | INTRAVENOUS | Status: AC
  Administered 2020-01-24: 150 mg via INTRAVENOUS

## 2020-01-24 MED ORDER — HEPARIN SODIUM (PORCINE) 1000 UNIT/ML IJ SOLN
INTRAMUSCULAR | Status: AC
  Administered 2020-01-24: 1000 [IU]
  Filled 2020-01-24: qty 2

## 2020-01-24 NOTE — Procedures (Signed)
I was present at this dialysis session. I have reviewed the session itself and made appropriate changes.   Vital signs in last 24 hours:  Temp:  [98.6 F (37 C)] 98.6 F (37 C) (02/20 0716) Pulse Rate:  [80-147] 80 (02/20 1230) Resp:  [13-38] 16 (02/20 1230) BP: (118-177)/(76-98) 118/76 (02/20 1230) SpO2:  [91 %-100 %] 100 % (02/20 1230) FiO2 (%):  [40 %] 40 % (02/20 1005) Weight:  [65.8 kg] 65.8 kg (02/20 0718) Weight change:  Filed Weights   01/24/20 0718  Weight: 65.8 kg    Recent Labs  Lab 01/24/20 0712  NA 142  K 3.6  CL 100  CO2 29  GLUCOSE 119*  BUN 20  CREATININE 8.57*  CALCIUM 9.6    Recent Labs  Lab 01/24/20 0712  WBC 7.5  NEUTROABS 5.2  HGB 9.3*  HCT 30.4*  MCV 107.0*  PLT 293    Scheduled Meds: . midodrine  10 mg Oral TID WC  . sodium chloride flush  3 mL Intravenous Q12H  . sucralfate  1 g Oral Once  . warfarin  5 mg Oral ONCE-1800  . Warfarin - Pharmacist Dosing Inpatient   Does not apply q1800   Continuous Infusions: . amiodarone 60 mg/hr (01/24/20 1235)  . amiodarone    . heparin 950 Units/hr (01/24/20 1113)   PRN Meds:.acetaminophen **OR** acetaminophen, albuterol, ondansetron **OR** ondansetron (ZOFRAN) IV   [name],  MD 01/24/2020, 1:16 PM

## 2020-01-24 NOTE — ED Provider Notes (Signed)
[facility] EMERGENCY DEPARTMENT Provider Note   CSN: [id number] Arrival date & time: 01/24/20  4235     History Chief Complaint  Patient presents with  . Chest Pain    [facility] Colee is a 79 y.o. male.  The history is provided by the patient and medical records. No language interpreter was used.  Chest Pain  [facility] Jacuinde is a 78 y.o. male who presents to the Emergency Department complaining of chest pain. He presents the emergency department by EMS for evaluation of left sided chest pain/left upper quadrant pain that began yesterday. Pain is described as a pressure type sensation with associated shortness of breath. 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 He is currently residing at a skilled nursing facility for rehab following prolonged hospitalization for COVID-19 infection. When he presented to his dialysis center today for his routine dialysis he mentioned his chest pain and EMS was activated for hospital transfer. He describes symptoms as mild to moderate in nature. He did vomit his morning medications today. He has mild associated nausea. He denies any fevers, diarrhea. No prior similar symptoms. He received for baby aspirin by EMS. Currently his pain is completely resolved.    Past Medical History:  Diagnosis Date  . 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 Acute on chronic systolic and diastolic heart failure, NYHA class 4 ([place])   . Anemia, iron deficiency 11/15/2011  . Arthritis    "hands, right knee, feet" (02/21/2018)  . Atrial fibrillation ([place])   . Cancer [name])    hx of prostate; s/p radioactive seed implant 10/2009 Dr Janice Norrie  . Cardiac arrest ([place]) 02/17/2018  . CHF (congestive heart failure) ([place])   . Chronic combined systolic and diastolic heart failure, NYHA class 3 ([place]) 06/2010   felt to be secondary to hypertensive cardiomyopathy  . Chronic lower back pain   . CKD (chronic kidney disease) stage V requiring chronic dialysis ([place])   . ESRD  on dialysis [facility])    started 02/2018 [place]  . GERD (gastroesophageal reflux disease)   . Glaucoma    "I had surgery and dont have it any more"  . Gout    daily RX (02/21/2018)  . Heart murmur    "mild" per pt  . Hepatitis    years ago  . HIV (human immunodeficiency virus infection) ([place])   . HIV infection ([place])   . Hyperlipidemia   . Hypertension    followed by [facility] (Dr Dani Gobble Croitoru)  . Hypertension   . Nonischemic cardiomyopathy ([place])   . Pneumonia 11/2017  . Prostate cancer ([place])   . Sinus bradycardia   . Sleep apnea    does not use a cpap  . Stroke [facility])    "mini stroke" years ago  . Wears glasses     Patient Active Problem List   Diagnosis Date Noted  . Pressure injury of skin 01/07/2020  . DNR (do not resuscitate)   . Combined systolic and diastolic heart failure ([place])   . ESRD on dialysis ([place])   . COVID-19 12/11/2019  . Acute respiratory failure with hypoxemia ([place]) 11/01/2019  . Hyperkalemia 09/02/2019  . Unspecified open wound of right upper arm, initial encounter 06/21/2019  . Shortness of breath 04/12/2019  . 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 Dependence on renal dialysis ([place]) 03/27/2019  . Hypertensive heart and chronic kidney disease with heart failure and with stage 5 chronic kidney disease, or end stage renal  disease ([place]) 03/27/2019  . Mesenteric ischemia ([place])   . Colonic ulcer   . Right lower quadrant pain   . Abnormal CT of the abdomen   . Ischemic colitis ([place])   . Colitis presumed infectious 03/09/2019  . Acute respiratory failure with hypoxia and hypercapnia ([place]) 03/04/2019  . Pruritus, unspecified 02/21/2019  . Acute pulmonary edema ([place]) 02/11/2019  . Pain, unspecified 01/21/2019  . Atrial fibrillation with RVR ([place]) 12/05/2018  . Paroxysmal atrial fibrillation ([place]) 11/16/2018  . LBBB (left bundle branch block) 11/16/2018  . Encounter for monitoring amiodarone therapy  11/16/2018  . Volume overload 11/01/2018  . Pulmonary edema 10/21/2018  . Gout 10/21/2018  . Hypertensive emergency 10/21/2018  . Chronic combined systolic and diastolic heart failure, NYHA class 3 ([place]) 10/21/2018  . Leukocytosis 10/21/2018  . Chills (without fever) 09/24/2018  . Gastroesophageal reflux disease 08/28/2018  . Chronic anticoagulation 04/03/2018  . CAD (coronary artery disease) 04/03/2018  . Palliative care encounter 03/27/2018  . Atrial fibrillation, chronic ([place]) 03/25/2018  . Anemia in chronic kidney disease 03/12/2018  . Coagulation defect, unspecified ([place]) 03/12/2018  . 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 Palliative care by specialist   . Advance care planning   . Goals of care, counseling/discussion   . Respiratory arrest ([place])   . Acute on chronic systolic heart failure ([place])   . Chronic obstructive pulmonary disease ([place])   . HIV (human immunodeficiency virus infection) ([place])   . ESRD (end stage renal disease) ([place])   . Acute respiratory failure with hypoxia ([place]) 01/21/2018  . Pneumonia   . Aspiration pneumonia of both lower lobes due to regurgitated food ([place])   . Sepsis due to pneumonia ([place]) 11/22/2017  . AIDS ([place]) 05/14/2014  . 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 Gastroparesis 05/06/2014  . Protein-calorie malnutrition, severe ([place]) 05/05/2014  . ESRD on hemodialysis ([place]) 05/02/2014  . Hemodialysis-associated hypotension 05/02/2014  . Leaking of conjunctival drainage bleb 05/01/2014  . POAG (primary open-angle glaucoma) 05/01/2014  . Loss of weight 04/29/2014  . Conjunctivitis 04/29/2014  . 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 Low back pain 04/09/2012  . Osteoarthritis of hip 12/29/2011  . Iron deficiency anemia 06/28/2011  . Macrocytic anemia 04/02/2011  . 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 Hyperlipidemia 11/30/2010  . Transient cerebral ischemia 11/30/2010  . HYPERGLYCEMIA  11/30/2010    Past Surgical History:  Procedure Laterality Date  . AV FISTULA PLACEMENT Right 10/13/2016   Procedure: ARTERIOVENOUS (AV) FISTULA CREATION;  Surgeon: [name], MD;  Location: [place];  Service: Vascular;  Laterality: Right;  . AV FISTULA PLACEMENT Left 02/25/2018   Procedure: INSERTION OF ARTERIOVENOUS (AV) GORE-TEX GRAFT LEFT UPPER ARM;  Surgeon: Angelia Mould, MD;  Location: [place];  Service: Vascular;  Laterality: Left;  . AV FISTULA PLACEMENT Right 08/07/2018   Procedure: Creation of right arm brachiocephalic Fistula;  Surgeon: Waynetta Sandy, MD;  Location: [place];  Service: Vascular;  Laterality: Right;  . AV FISTULA PLACEMENT Left 11/10/2019   Procedure: INSERTION OF ARTERIOVENOUS (AV) GORE-TEX GRAFT THIGH;  Surgeon: Elam Dutch, MD;  Location: [place];  Service: Vascular;  Laterality: Left;  . AV FISTULA PLACEMENT Right 12/02/2019   Procedure: INSERTION OF ARTERIOVENOUS (AV) GORE-TEX GRAFT RIGHT  THIGH;  Surgeon: Waynetta Sandy, MD;  Location: [place];  Service: Vascular;  Laterality: Right;  . BASCILIC VEIN TRANSPOSITION Left 06/24/2015   Procedure: BASILIC VEIN TRANSPOSITION;  Surgeon: Mal Misty,  MD;  Location: [place];  Service: Vascular;  Laterality: Left;  . BIOPSY  03/14/2019   Procedure: BIOPSY;  Surgeon: Irene Shipper, MD;  Location: [place];  Service: Endoscopy;;  . CATARACT EXTRACTION, BILATERAL Bilateral   . COLONOSCOPY WITH PROPOFOL N/A 03/14/2019   Procedure: COLONOSCOPY WITH PROPOFOL;  Surgeon: Irene Shipper, MD;  Location: [facility] ENDOSCOPY;  Service: Endoscopy;  Laterality: N/A;  . ESOPHAGOGASTRODUODENOSCOPY (EGD) WITH PROPOFOL N/A 05/05/2014   Procedure: ESOPHAGOGASTRODUODENOSCOPY (EGD) WITH PROPOFOL;  Surgeon: Missy Sabins, MD;  Location: [facility] ENDOSCOPY;  Service: Endoscopy;  Laterality: N/A;  . EYE SURGERY Bilateral    cataract surgery   . GLAUCOMA SURGERY Bilateral   . INSERTION OF ARTERIOVENOUS (AV) ARTEGRAFT ARM   10/09/2018   Procedure: INSERTION OF ARTERIOVENOUS (AV) 33m x 41cm ARTEGRAFT LEFT UPPER ARM;  Surgeon: CWaynetta Sandy MD;  [place]  Service: Vascular;;  . INSERTION OF DIALYSIS CATHETER Right 07/14/2019   Procedure: Insertion Of Dialysis Catheter;  Surgeon: FElam Dutch MD;  [facility]  Service: Vascular;  Laterality: Right;  placed right Internal Jugular  . INSERTION PROSTATE RADIATION SEED    . IR FLUORO GUIDE CV LINE RIGHT  02/18/2018  . IR REMOVAL TUN CV CATH W/O FL  12/06/2018  . IR [place] VASC ACCESS RIGHT  02/18/2018  . LEFT HEART CATH AND CORONARY ANGIOGRAPHY N/A 02/20/2018   Procedure: LEFT HEART CATH AND CORONARY ANGIOGRAPHY;  Surgeon: VJettie Booze MD;  [place];;; 50% ostial OM2, 25% mLAD.  .Marland KitchenLEFT HEART CATH AND CORONARY ANGIOGRAPHY  2004   mildly depressed LV systolic fx EF 575%,IEPPIRcoronaries/abdominal aorta/renal arteries.  . LOWER EXTREMITY ANGIOGRAM Left 11/17/2019   Procedure: Lower Extremity Fistulogram;  Surgeon: CMarty Heck MD;  [place]  Service: Vascular;  Laterality: Left;  . NM MLima 02/21/2010   No ischemia or infarction.  EF 27%.  .Marland KitchenRADIOACTIVE SEED IMPLANT  2010   prostate cancer  . REVISION OF ARTERIOVENOUS GORETEX GRAFT Right 07/11/2019   Procedure: REVISION OF ARTERIOVENOUS GORETEX GRAFT RIGHT ARM WITH ARTEGRAFT;  Surgeon: BSerafina Mitchell MD;  [place]  Service: Vascular;  Laterality: Right;  . SHUNTOGRAM Right 07/14/2019   Procedure: Shuntogram;  Surgeon: FElam Dutch MD;  [place]  Service: Vascular;  Laterality: Right;  . THROMBECTOMY AND REVISION OF ARTERIOVENTOUS (AV) GORETEX  GRAFT Right 07/14/2019   Procedure: THROMBECTOMY AND REVISION OF ARTERIOVENTOUS (AV) GORETEX  GRAFT;  Surgeon: FElam Dutch MD;  [place]  Service: Vascular;  Laterality: Right;  . THROMBECTOMY W/ EMBOLECTOMY Left 02/26/2018   Procedure: THROMBECTOMY  ARTERIOVENOUS GRAFT;  Surgeon: DAngelia Mould MD;  [place]  Service: Vascular;  Laterality: Left;  . THROMBECTOMY W/ EMBOLECTOMY Left 11/17/2019   Procedure: THROMBECTOMY ARTERIOVENOUS GRAFT LEFT THIGH;  Surgeon: CMarty Heck MD;  [place]  Service: Vascular;  Laterality: Left;  . TRANSTHORACIC ECHOCARDIOGRAM  02/2012   ([facility] EF 45-50% with mild global HK.  Mild LVH,LA mod. dilated,mild-mod. 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 AI.  .Marland KitchenTRANSTHORACIC ECHOCARDIOGRAM  9/'18; 3/'19   a) Severely reduced EF.  25 from 30%.  GR 1 DD with diffuse ecchymosis. Mild AS & AI.  Mild LA/RA dilation; b) (post PEA arest):   Moderately dilated LV with moderate concentric virtually.  EF 15 to 20%.  GR 1 DD.  Paradoxical septal motion. Mild  AI.  Mild LA dilation.  Poorly visualized RV.  Marland Kitchen TRANSTHORACIC ECHOCARDIOGRAM  02/2019   EF 20-25%.  Unable to assess diastolic function (A. Fib).  No LV apical thrombus.  Mild AI.  Mildly reduced RV function, however poorly visualized.  Marland Kitchen ULTRASOUND GUIDANCE FOR VASCULAR ACCESS  02/20/2018   Procedure: Ultrasound Guidance For Vascular Access;  Surgeon: Jettie Booze, MD;  Location[place] CV LAB;  Service: Cardiovascular;;  . UPPER EXTREMITY VENOGRAPHY N/A 07/08/2018   Procedure: UPPER EXTREMITY VENOGRAPHY;  Surgeon: Waynetta Sandy, MD;  Location: [place] CV LAB;  Service: Cardiovascular;  Laterality: N/A;  Bilateral       Family History  Problem Relation Age of Onset  . Hypertension Mother   . Thyroid disease Mother   . Cholelithiasis Daughter   . Cholelithiasis Son   . Hypertension Maternal Grandmother   . Diabetes Maternal Grandmother   . Heart attack Neg Hx   . Hyperlipidemia Neg Hx     Social History   Tobacco Use  . Smoking status: Former Smoker    Packs/day: 1.00    Years: 30.00    Pack years: 30.00    Types: Cigarettes    Quit date: 2006    Years since quitting:  15.1  . Smokeless tobacco: Never Used  Substance Use Topics  . Alcohol use: Yes    Alcohol/week: 1.0 standard drinks    Types: 1 Shots of liquor per week    Comment: occasional  . Drug use: Never    Home Medications Prior to Admission medications   Medication Sig Start Date End Date Taking? Authorizing Provider  acetaminophen (TYLENOL) 500 MG tablet Take 1,000 mg by mouth every 6 (six) hours as needed for moderate pain.    [provider]  allopurinol (ZYLOPRIM) 100 MG tablet 1 tablet by mouth 3x weekly after dialysis. 09/15/19   [name], NP  amiodarone (PACERONE) 200 MG tablet Take 1 tablet (200 mg total) by mouth daily. 05/12/19   Croitoru, Mihai, MD  bictegravir-emtricitabine-tenofovir AF ([name]) 50-200-25 MG TABS tablet Take 1 tablet by mouth daily. 09/29/19   Truman Hayward, MD  brimonidine (ALPHAGAN) 0.15 % ophthalmic solution Place 1 drop into the left eye 2 (two) times daily. 11/15/17   [provider]  dorzolamide-timolol (COSOPT) 22.3-6.8 MG/ML ophthalmic solution Place 1 drop into the left eye 2 (two) times daily. 12/17/17   [provider]  latanoprost (XALATAN) 0.005 % ophthalmic solution Place 1 drop into the left eye at bedtime.     [provider]  loratadine (CLARITIN) 10 MG tablet Take 10 mg by mouth daily.    [provider]  midodrine (PROAMATINE) 10 MG tablet Take 1 tablet (10 mg total) by mouth as directed. Twice a day on the day of HD Tuesday, Thursday, Saturday (1 in AM and 1 at [place]). Take 1/2 tablet (40m) a day on non HD days Patient taking differently: Take 10 mg by mouth See admin instructions. Take 1 tablet (10 mg) by mouth 3 times daily on Tuesdays, Thursdays, & Saturdays. 04/22/19   HLeonie Man MD  montelukast (SINGULAIR) 10 MG tablet Take 1 tablet (10 mg total) by mouth at bedtime. 09/12/19   ODebbrah Alar NP  multivitamin (RENA-VIT) TABS tablet Take 1 tablet by mouth daily at 2 PM.      [provider]  traMADol (ULTRAM) 50 MG tablet Take 1 tablet (50 mg total) by mouth every 6 (six) hours as needed. 01/09/20  Thurnell Lose, MD  VELPHORO 500 MG chewable tablet Chew 500 mg by mouth 3 (three) times daily with meals.  09/29/19   [provider]  warfarin (COUMADIN) 5 MG tablet TAKE 1/2 TO 1 TABLET BY MOUTH DAILY AS DIRECTED BY COUMADIN CLINIC Patient taking differently: Take 2.5-5 mg by mouth See admin instructions. Take 1/2 to 1 tablet by mouth daily as directed by coumadin clinic; 2.5 mg on Mon and Fri, 5 ,g all other days 11/04/19   Leonie Man, MD    Allergies    Dextromethorphan-guaifenesin, Tocotrienols, and Losartan potassium  Review of Systems   Review of Systems  Cardiovascular: Positive for chest pain.  All other systems reviewed and are negative.   Physical Exam Updated Vital Signs BP 121/81   Pulse 91   Temp 98.6 F (37 C) (Oral)   Resp 19   Ht 6' 2"  (1.88 m)   Wt 65.8 kg   SpO2 98%   BMI 18.62 kg/m   Physical Exam Vitals and nursing note reviewed.  Constitutional:      Appearance: He is well-developed.  HENT:     Head: Normocephalic and atraumatic.  Cardiovascular:     Rate and Rhythm: Normal rate and regular rhythm.     Heart sounds: No murmur.  Pulmonary:     Effort: Pulmonary effort is normal. No respiratory distress.     Breath sounds: Normal breath sounds.     Comments: Vast cath and right upper chest wall with dressing in place, clean/dry/intact Abdominal:     Palpations: Abdomen is soft.     Tenderness: There is no abdominal tenderness. There is no guarding or rebound.  Musculoskeletal:        General: No swelling or tenderness.  Skin:    General: Skin is warm and dry.  Neurological:     Mental Status: He is alert and oriented to person, place, and time.  Psychiatric:        Behavior: Behavior normal.     ED Results / Procedures / Treatments   Labs (all labs ordered are listed, but only abnormal  results are displayed) Labs Reviewed  COMPREHENSIVE METABOLIC PANEL - Abnormal; Notable for the following components:      Result Value   Glucose, Bld 119 (*)    Creatinine, Ser 8.57 (*)    Total Protein 6.1 (*)    Albumin 2.9 (*)    Alkaline Phosphatase 149 (*)    GFR calc non Af Amer 5 (*)    GFR calc Af Amer 6 (*)    All other components within normal limits  CBC WITH DIFFERENTIAL/PLATELET - Abnormal; Notable for the following components:   RBC 2.84 (*)    Hemoglobin 9.3 (*)    HCT 30.4 (*)    MCV 107.0 (*)    RDW 19.2 (*)    nRBC 0.5 (*)    All other components within normal limits  PROTIME-INR - Abnormal; Notable for the following components:   Prothrombin Time 15.5 (*)    All other components within normal limits  TROPONIN I (HIGH SENSITIVITY) - Abnormal; Notable for the following components:   Troponin I (High Sensitivity) 55 (*)    All other components within normal limits  TROPONIN I (HIGH SENSITIVITY) - Abnormal; Notable for the following components:   Troponin I (High Sensitivity) 70 (*)    All other components within normal limits  LIPASE, BLOOD  HEPARIN LEVEL (UNFRACTIONATED)    EKG EKG Interpretation  Date/Time:  Saturday January 24 2020 08:52:28 EST Ventricular Rate:  134 PR Interval:    QRS Duration: 142 QT Interval:  378 QTC Calculation: 565 R Axis:   -60 Text Interpretation: Ectopic atrial tachycardia, unifocal Left bundle branch block Confirmed by Quintella Reichert [phone]) on 01/24/2020 9:36:29 AM   Radiology DG Chest Port 1 View  Result Date: 01/24/2020 CLINICAL DATA:  Left-sided chest pain EXAM: PORTABLE CHEST 1 VIEW COMPARISON:  12/24/2019 FINDINGS: Airspace disease symmetrically at the bases with a few Kerley lines. No visible effusion. Perma catheter on the right with tip in good position. Cardiomegaly. No pneumothorax IMPRESSION: 1. Symmetric pulmonary opacity favoring edema. 2. More prominent cardiomegaly than on recent comparison.  Electronically Signed   By: Monte Fantasia M.D.   On: 01/24/2020 07:43    Procedures Procedures (including critical care time) CRITICAL CARE Performed by: Quintella Reichert   Total critical care time: 45 minutes  Critical care time was exclusive of separately billable procedures and treating other patients.  Critical care was necessary to treat or prevent imminent or life-threatening deterioration.  Critical care was time spent personally by me on the following activities: development of treatment plan with patient and/or surrogate as well as nursing, discussions with consultants, evaluation of patient's response to treatment, examination of patient, obtaining history from patient or surrogate, ordering and performing treatments and interventions, ordering and review of laboratory studies, ordering and review of radiographic studies, pulse oximetry and re-evaluation of patient's condition.  Medications Ordered in ED Medications  sucralfate (CARAFATE) tablet 1 g (1 g Oral Not Given 01/24/20 1027)  amiodarone (NEXTERONE PREMIX) 360-4.14 MG/200ML-% (1.8 mg/mL) IV infusion (60 mg/hr Intravenous New Bag/Given 01/24/20 1011)  amiodarone (NEXTERONE PREMIX) 360-4.14 MG/200ML-% (1.8 mg/mL) IV infusion (has no administration in time range)  sodium chloride flush (NS) 0.9 % injection 3 mL (3 mLs Intravenous Given 01/24/20 1029)  acetaminophen (TYLENOL) tablet 650 mg (has no administration in time range)    Or  acetaminophen (TYLENOL) suppository 650 mg (has no administration in time range)  ondansetron (ZOFRAN) tablet 4 mg (has no administration in time range)    Or  ondansetron (ZOFRAN) injection 4 mg (has no administration in time range)  albuterol (PROVENTIL) (2.5 MG/3ML) 0.083% nebulizer solution 2.5 mg (has no administration in time range)  midodrine (PROAMATINE) tablet 10 mg (has no administration in time range)  heparin ADULT infusion 100 units/mL (25000 units/267m sodium chloride 0.45%) (950  Units/hr Intravenous New Bag/Given 01/24/20 1113)  warfarin (COUMADIN) tablet 5 mg (has no administration in time range)  Warfarin - Pharmacist Dosing Inpatient (has no administration in time range)  diltiazem (CARDIZEM) 1 mg/mL load via infusion 15 mg (15 mg Intravenous Bolus from Bag 01/24/20 0916)  amiodarone (NEXTERONE) IV bolus only 150 mg/100 mL (0 mg Intravenous Stopped 01/24/20 1011)  heparin bolus via infusion 3,000 Units (3,000 Units Intravenous Bolus from Bag 01/24/20 1113)    ED Course  I have reviewed the triage vital signs and the nursing notes.  Pertinent labs & imaging results that were available during my care of the patient were reviewed by me and considered in my medical decision making (see chart for details).    MDM Rules/Calculators/A&P                     Pt with hx/o ESRD, atrial fibrillation here for evaluation of chest pressure/LUQ discomfort and SOB that started yesterday.  He is asymptomatic on ED arrival and in no acute distress. EKG with stable LBBB.  On repeat assessment pt complaining of chest pressure, sob.  Pt now with respiratory distress, diffuse crackles, tachycardia to the 130s.  EKG with rapid afib vs vtach.  Pt vomited his home amiodarone dose - will treat with IV amio for rate control.  D/w nephrology re pt's need for emergent dialysis.  Hospitalist consulted for admission.  Pt and wife updated of findings of studies and recommendation for admission and they are in agreement with treatment plan.    Final Clinical Impression(s) / ED Diagnoses Final diagnoses:  Acute pulmonary edema ([place])  ESRD (end stage renal disease) on dialysis [facility])    Rx / DC Orders ED Discharge Orders    None       Quintella Reichert, MD 01/24/20 1135

## 2020-01-24 NOTE — ED Triage Notes (Signed)
Pt brought from dialysis via EMS with c/o chest pain described as pressure in left rib area, exertional dyspnea, and gen weakness. M-T-TH-SA dialysis pt with hx of CHF. 324 aspirin admin by EMS. Wheelchair bound, currently in rehab to strengthen legs per pt. A&O x4. Currently denies pain, n/v, headache. Pt states he has a little shortness of breath. VSS.

## 2020-01-24 NOTE — Consult Note (Addendum)
[facility] Renal Consultation Note  Indication for Consultation:  Management of ESRD/hemodialysis; anemia, hypertension/volume and secondary hyperparathyroidism  HPI: [name] David [name] is a 79 y.o. male with ESRD  (HD [facility]) NICM 20% EF , sCHF; hx/o cardiac arrest 02/2018 CVA , Ho Syphilis /hx/o prostate cancer AFib on warfarin/amo/ acute isch colitis per colonoscopy -dcr bp  midodrine 10mg  tid  recent COVID  Admit 12/11/19- 01/09/20 s/p IV dexamethasone and remdesivir - off isolation 1/28.  DC to [facility]  Sent from [facility] to ER this am secondary to Chest pain (no HD done ,has not missed any op hd recently)IN  ER  Asymptomatic initially then developed respiratory distress, tachycardia to the 130s.  EKG with rapid afib vs vtach.  Pt vomited his home amiodarone dose -IV amio started  Iv Cardizem for rate control  And place on BIPAP.iv heparin ,   CXR  Showing pulmonary edema  K3.6  HGb 9.3   .   Noted at op hd center leaving below his edw/ no reported fever or chills. Has Clotted Fem AVGG with perm cath placed at ckv  With plans for ckv  declott  This wed 01/28/20 .    Past Medical History:  Diagnosis Date  . 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 Acute on chronic systolic and diastolic heart failure, NYHA class 4 ([place])   . Anemia, iron deficiency 11/15/2011  . Arthritis    "hands, right knee, feet" (02/21/2018)  . Atrial fibrillation ([place])   . Cancer [facility])    hx of prostate; s/p radioactive seed implant 10/2009 Dr Janice Norrie  . Cardiac arrest ([place]) 02/17/2018  . CHF (congestive heart failure) ([place])   . Chronic combined systolic and diastolic heart failure, NYHA class 3 ([place]) 06/2010   felt to be secondary to hypertensive cardiomyopathy  . Chronic lower back pain   . CKD (chronic kidney disease) stage V requiring chronic dialysis ([place])   . ESRD on dialysis [facility])    started 02/2018 [place]  . GERD (gastroesophageal reflux disease)   .  Glaucoma    "I had surgery and dont have it any more"  . Gout    daily RX (02/21/2018)  . Heart murmur    "mild" per pt  . Hepatitis    years ago  . HIV (human immunodeficiency virus infection) ([place])   . HIV infection ([place])   . Hyperlipidemia   . Hypertension    followed by [facility] and Vascular (Dr Dani Gobble Croitoru)  . Hypertension   . Nonischemic cardiomyopathy ([place])   . Pneumonia 11/2017  . Prostate cancer ([place])   . Sinus bradycardia   . Sleep apnea    does not use a cpap  . Stroke [facility])    "mini stroke" years ago  . Wears glasses     Past Surgical History:  Procedure Laterality Date  . AV FISTULA PLACEMENT Right 10/13/2016   Procedure: ARTERIOVENOUS (AV) FISTULA CREATION;  Surgeon: [name], MD;  Location: [place];  Service: Vascular;  Laterality: Right;  . AV FISTULA PLACEMENT Left 02/25/2018   Procedure: INSERTION OF ARTERIOVENOUS (AV) GORE-TEX GRAFT LEFT UPPER ARM;  Surgeon: Angelia Mould, MD;  Location: [place];  Service: Vascular;  Laterality: Left;  . AV FISTULA PLACEMENT Right 08/07/2018   Procedure: Creation of right arm brachiocephalic Fistula;  Surgeon: Waynetta Sandy, MD;  Location: [place];  Service: Vascular;  Laterality: Right;  . AV FISTULA PLACEMENT Left 11/10/2019  Procedure: INSERTION OF ARTERIOVENOUS (AV) GORE-TEX GRAFT THIGH;  Surgeon: Elam Dutch, MD;  Location: [place];  Service: Vascular;  Laterality: Left;  . AV FISTULA PLACEMENT Right 12/02/2019   Procedure: INSERTION OF ARTERIOVENOUS (AV) GORE-TEX GRAFT RIGHT  THIGH;  Surgeon: Waynetta Sandy, MD;  Location: [place];  Service: Vascular;  Laterality: Right;  . BASCILIC VEIN TRANSPOSITION Left 06/24/2015   Procedure: BASILIC VEIN TRANSPOSITION;  Surgeon: Mal Misty, MD;  Location: [place];  Service: Vascular;  Laterality: Left;  . BIOPSY  03/14/2019   Procedure: BIOPSY;  Surgeon: Irene Shipper, MD;  Location: [place];  Service: Endoscopy;;  . CATARACT  EXTRACTION, BILATERAL Bilateral   . COLONOSCOPY WITH PROPOFOL N/A 03/14/2019   Procedure: COLONOSCOPY WITH PROPOFOL;  Surgeon: Irene Shipper, MD;  Location: [facility] ENDOSCOPY;  Service: Endoscopy;  Laterality: N/A;  . ESOPHAGOGASTRODUODENOSCOPY (EGD) WITH PROPOFOL N/A 05/05/2014   Procedure: ESOPHAGOGASTRODUODENOSCOPY (EGD) WITH PROPOFOL;  Surgeon: Missy Sabins, MD;  Location: [facility] ENDOSCOPY;  Service: Endoscopy;  Laterality: N/A;  . EYE SURGERY Bilateral    cataract surgery   . GLAUCOMA SURGERY Bilateral   . INSERTION OF ARTERIOVENOUS (AV) ARTEGRAFT ARM  10/09/2018   Procedure: INSERTION OF ARTERIOVENOUS (AV) 67mm x 41cm ARTEGRAFT LEFT UPPER ARM;  Surgeon: Waynetta Sandy, MD;  Location: [place];  Service: Vascular;;  . INSERTION OF DIALYSIS CATHETER Right 07/14/2019   Procedure: Insertion Of Dialysis Catheter;  Surgeon: Elam Dutch, MD;  Location: [facility] OR;  Service: Vascular;  Laterality: Right;  placed right Internal Jugular  . INSERTION PROSTATE RADIATION SEED    . IR FLUORO GUIDE CV LINE RIGHT  02/18/2018  . IR REMOVAL TUN CV CATH W/O FL  12/06/2018  . IR US GUIDE VASC ACCESS RIGHT  02/18/2018  . LEFT HEART CATH AND CORONARY ANGIOGRAPHY N/A 02/20/2018   Procedure: LEFT HEART CATH AND CORONARY ANGIOGRAPHY;  Surgeon: Jettie Booze, MD;  Location: [place] CV LAB;;; 50% ostial OM2, 25% mLAD.  Marland Kitchen LEFT HEART CATH AND CORONARY ANGIOGRAPHY  2004   mildly depressed LV systolic fx EF 64%,PPIRJJ coronaries/abdominal aorta/renal arteries.  . LOWER EXTREMITY ANGIOGRAM Left 11/17/2019   Procedure: Lower Extremity Fistulogram;  Surgeon: Marty Heck, MD;  Location: [place];  Service: Vascular;  Laterality: Left;  . NM [place]  02/21/2010   No ischemia or infarction.  EF 27%.  Marland Kitchen RADIOACTIVE SEED IMPLANT  2010   prostate cancer  . REVISION OF ARTERIOVENOUS GORETEX GRAFT Right 07/11/2019   Procedure: REVISION OF ARTERIOVENOUS GORETEX GRAFT RIGHT ARM WITH ARTEGRAFT;  Surgeon:  Serafina Mitchell, MD;  Location: [place];  Service: Vascular;  Laterality: Right;  . SHUNTOGRAM Right 07/14/2019   Procedure: Shuntogram;  Surgeon: Elam Dutch, MD;  Location: [place];  Service: Vascular;  Laterality: Right;  . THROMBECTOMY AND REVISION OF ARTERIOVENTOUS (AV) GORETEX  GRAFT Right 07/14/2019   Procedure: THROMBECTOMY AND REVISION OF ARTERIOVENTOUS (AV) GORETEX  GRAFT;  Surgeon: Elam Dutch, MD;  Location: [place];  Service: Vascular;  Laterality: Right;  . THROMBECTOMY W/ EMBOLECTOMY Left 02/26/2018   Procedure: THROMBECTOMY ARTERIOVENOUS GRAFT;  Surgeon: Angelia Mould, MD;  Location: [place];  Service: Vascular;  Laterality: Left;  . THROMBECTOMY W/ EMBOLECTOMY Left 11/17/2019   Procedure: THROMBECTOMY ARTERIOVENOUS GRAFT LEFT THIGH;  Surgeon: Marty Heck, MD;  Location: [place];  Service: Vascular;  Laterality: Left;  . TRANSTHORACIC ECHOCARDIOGRAM  02/2012   [facility]) EF 45-50% with mild  global HK.  Mild LVH,LA mod. dilated,mild-mod. 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 AI.  Marland Kitchen TRANSTHORACIC ECHOCARDIOGRAM  9/'18; 3/'19   a) Severely reduced EF.  25 from 30%.  GR 1 DD with diffuse ecchymosis. Mild AS & AI.  Mild LA/RA dilation; b) (post PEA arest):   Moderately dilated LV with moderate concentric virtually.  EF 15 to 20%.  GR 1 DD.  Paradoxical septal motion. Mild AI.  Mild LA dilation.  Poorly visualized RV.  Marland Kitchen TRANSTHORACIC ECHOCARDIOGRAM  02/2019   EF 20-25%.  Unable to assess diastolic function (A. Fib).  No LV apical thrombus.  Mild AI.  Mildly reduced RV function, however poorly visualized.  Marland Kitchen ULTRASOUND GUIDANCE FOR VASCULAR ACCESS  02/20/2018   Procedure: Ultrasound Guidance For Vascular Access;  Surgeon: Jettie Booze, MD;  Location: [place] CV LAB;  Service: Cardiovascular;;  . UPPER EXTREMITY VENOGRAPHY N/A 07/08/2018   Procedure: UPPER EXTREMITY VENOGRAPHY;  Surgeon: Waynetta Sandy, MD;  Location: [place] CV LAB;  Service: Cardiovascular;  Laterality: N/A;  Bilateral      Family History  Problem Relation Age of Onset  . Hypertension Mother   . Thyroid disease Mother   . Cholelithiasis Daughter   . Cholelithiasis Son   . Hypertension Maternal Grandmother   . Diabetes Maternal Grandmother   . Heart attack Neg Hx   . Hyperlipidemia Neg Hx       reports that he quit smoking about 15 years ago. His smoking use included cigarettes. He has a 30.00 pack-year smoking history. He has never used smokeless tobacco. He reports current alcohol use of about 1.0 standard drinks of alcohol per week. He reports that he does not use drugs.   Allergies  Allergen Reactions  . Dextromethorphan-Guaifenesin     UNKNOWN   . Tocotrienols     UNKNOWN   . Losartan Potassium Other (See Comments)    Causes constipation    Prior to Admission medications   Medication Sig Start Date End Date Taking? Authorizing Provider  acetaminophen (TYLENOL) 500 MG tablet Take 1,000 mg by mouth every 6 (six) hours as needed for moderate pain.    [provider]  allopurinol (ZYLOPRIM) 100 MG tablet 1 tablet by mouth 3x weekly after dialysis. 09/15/19   [name], NP  amiodarone (PACERONE) 200 MG tablet Take 1 tablet (200 mg total) by mouth daily. 05/12/19   Croitoru, Mihai, MD  bictegravir-emtricitabine-tenofovir AF ([name]) 50-200-25 MG TABS tablet Take 1 tablet by mouth daily. 09/29/19   Truman Hayward, MD  brimonidine (ALPHAGAN) 0.15 % ophthalmic solution Place 1 drop into the left eye 2 (two) times daily. 11/15/17   [provider]  dorzolamide-timolol (COSOPT) 22.3-6.8 MG/ML ophthalmic solution Place 1 drop into the left eye 2 (two) times daily. 12/17/17   [provider]  latanoprost (XALATAN) 0.005 % ophthalmic solution Place 1 drop into the left eye at bedtime.     [provider]  loratadine (CLARITIN) 10 MG tablet Take 10 mg by mouth daily.    [provider]  midodrine (PROAMATINE) 10 MG tablet Take 1 tablet (10 mg total) by mouth as directed. Twice a day on the day of HD Tuesday, Thursday, Saturday (1 in AM and 1 at [place]). Take 1/2 tablet (5mg ) a day on non HD days Patient taking differently: Take 10 mg by mouth See admin instructions. Take 1 tablet (10 mg) by mouth 3 times daily on Tuesdays, Thursdays, &  Saturdays. 04/22/19   Leonie Man, MD  montelukast (SINGULAIR) 10 MG tablet Take 1 tablet (10 mg total) by mouth at bedtime. 09/12/19   [name], NP  multivitamin (RENA-VIT) TABS tablet Take 1 tablet by mouth daily at 2 PM.     [provider]  traMADol (ULTRAM) 50 MG tablet Take 1 tablet (50 mg total) by mouth every 6 (six) hours as needed. 01/09/20   Thurnell Lose, MD  VELPHORO 500 MG chewable tablet Chew 500 mg by mouth 3 (three) times daily with meals.  09/29/19   [provider]  warfarin (COUMADIN) 5 MG tablet TAKE 1/2 TO 1 TABLET BY MOUTH DAILY AS DIRECTED BY COUMADIN CLINIC Patient taking differently: Take 2.5-5 mg by mouth See admin instructions. Take 1/2 to 1 tablet by mouth daily as directed by coumadin clinic; 2.5 mg on Mon and Fri, 5 ,g all other days 11/04/19   Leonie Man, MD     Anti-infectives (From admission, onward)   None      Results for orders placed or performed during the hospital encounter of 01/24/20 (from the past 48 hour(s))  Comprehensive metabolic panel     Status: Abnormal   Collection Time: 01/24/20  7:12 AM  Result Value Ref Range   Sodium 142 135 - 145 mmol/L   Potassium 3.6 3.5 - 5.1 mmol/L   Chloride 100 98 - 111 mmol/L   CO2 29 22 - 32 mmol/L   Glucose, Bld 119 (H) 70 - 99 mg/dL   BUN 20 8 - 23 mg/dL   Creatinine, Ser 8.57 (H) 0.61 - 1.24 mg/dL   Calcium 9.6 8.9 - 10.3 mg/dL   Total Protein 6.1 (L) 6.5 - 8.1 g/dL   Albumin 2.9 (L) 3.5 - 5.0 g/dL   AST 19 15 - 41 U/L   ALT 16 0 - 44 U/L   Alkaline Phosphatase 149 (H) 38 - 126 U/L   Total Bilirubin  0.6 0.3 - 1.2 mg/dL   GFR calc non Af Amer 5 (L) >60 mL/min   GFR calc Af Amer 6 (L) >60 mL/min   Anion gap 13 5 - 15    Comment: Performed at [facility], [address] [place], [place] [address]  Troponin I (High Sensitivity)     Status: Abnormal   Collection Time: 01/24/20  7:12 AM  Result Value Ref Range   Troponin I (High Sensitivity) 55 (H) <18 ng/L    Comment: (NOTE) Elevated high sensitivity troponin I (hsTnI) values and significant  changes across serial measurements may suggest ACS but many other  chronic and acute conditions are known to elevate hsTnI results.  Refer to the "Links" section for chest pain algorithms and additional  guidance. Performed at [facility], [place] [address] [place], [place] [address]   Lipase, blood     Status: None   Collection Time: 01/24/20  7:12 AM  Result Value Ref Range   Lipase 33 11 - 51 U/L    Comment: Performed at [facility] [address], [place], [place] [address]  CBC with Differential     Status: Abnormal   Collection Time: 01/24/20  7:12 AM  Result Value Ref Range   WBC 7.5 4.0 - 10.5 K/uL   RBC 2.84 (L) 4.22 - 5.81 MIL/uL   Hemoglobin 9.3 (L) 13.0 - 17.0 g/dL   HCT 30.4 (L) 39.0 - 52.0 %   MCV 107.0 (H) 80.0 - 100.0 fL  MCH 32.7 26.0 - 34.0 pg   MCHC 30.6 30.0 - 36.0 g/dL   RDW 19.2 (H) 11.5 - 15.5 %   Platelets 293 150 - 400 K/uL   nRBC 0.5 (H) 0.0 - 0.2 %   Neutrophils Relative % 68 %   Neutro Abs 5.2 1.7 - 7.7 K/uL   Lymphocytes Relative 16 %   Lymphs Abs 1.2 0.7 - 4.0 K/uL   Monocytes Relative 13 %   Monocytes Absolute 0.9 0.1 - 1.0 K/uL   Eosinophils Relative 2 %   Eosinophils Absolute 0.1 0.0 - 0.5 K/uL   Basophils Relative 1 %   Basophils Absolute 0.1 0.0 - 0.1 K/uL   Immature Granulocytes 0 %   Abs Immature Granulocytes 0.03 0.00 - 0.07 K/uL    Comment: Performed at [facility] [address], [place], [place] [address]  Protime-INR     Status: Abnormal   Collection Time:  01/24/20  7:12 AM  Result Value Ref Range   Prothrombin Time 15.5 (H) 11.4 - 15.2 seconds   INR 1.2 0.8 - 1.2    Comment: (NOTE) INR goal varies based on device and disease states. Performed at [facility], [place] [address], [place], [place] [address]   Troponin I (High Sensitivity)     Status: Abnormal   Collection Time: 01/24/20  9:10 AM  Result Value Ref Range   Troponin I (High Sensitivity) 70 (H) <18 ng/L    Comment: (NOTE) Elevated high sensitivity troponin I (hsTnI) values and significant  changes across serial measurements may suggest ACS but many other  chronic and acute conditions are known to elevate hsTnI results.  Refer to the "Links" section for chest pain algorithms and additional  guidance. Performed at [facility], [place] [address], [place], [place] [address]     ROS:  As above  Physical Exam: Vitals:   01/24/20 0815 01/24/20 0917  BP: (!) 153/91 (!) 177/98  Pulse: 92 (!) 147  Resp: (!) 29 (!) 37  Temp:    SpO2: 93% 91%     General: alert in ER on BIPAP,not in distress   HEENT: [place] , not icteric , BIPAP mask on  Neck: Pos jvd , supple Heart: tachy regular currently  , no  Rub, mur  Lungs: using some accessory muscles to breathe , scattered crackles, not in distress on bipap Abdomen: soft , NT, ND Extremities: trace bipedal edema  Skin: dry skin . No pedal ulcers  Neuro: alert moves all extrem  Dialysis Access: R Fem avgg clotted ,no bruit, R  IJ perm cath    Dialysis Orders: Center: [place]  on MTTHurs Sat . EDW 79.5 (below edw last tx) HD Bath 2k, 2ca  Time 4hr Heparin 2000. Access R IJ Perm cath / R Fem AVGG (clotted Aleene Davidson for ckv  Wed 01/28/20)     Calcitriol 0.5  mcg po HD  Mircera 166mcg (last given 01/17/20 )    Assessment/Plan 1. Volume overload / Pulmonary edema = uf as allows today needs lower edw at dc /leaving below edw as op (wt loss sp COVID last month) 2. Rapid Afib/ vs Vtach = per admit /was on amio/comadin as op  3. ESRD -   Normal schdule MonTueThurs Sat hd , HD today 4. Chest Pain = wu per admit ,?related to 1. +2.    5. Hypertension/volume  - using Midodrine pre hd / bp stable in ER  6. Anemia of ESRD   -  HGb 9.3  7. Metabolic bone disease -  Ca corec sl >10  Using 2 .0 ca bath  Hold Calcitriol until ca decreased 8. Clotted R fem AVGG - using perm cath now , reported plans per [facility] to declott this wed 2/24  Per their notes form ckv 9. Nutrition - alb  Use ensure as last admit and add prostat  10. Combined CHF  EF 20% - needs HD 4 X per week  11. HO CVA = at snh  12. HO HIV = meds per admit   Ernest Haber, PA-C [place] [phone] 01/24/2020, 9:46 AM    I have seen and examined this patient and agree with plan and assessment in the above note with renal recommendations/intervention highlighted. Pt currently comfortable on BIPAP and receiving HD with UF as tolerated.  Likely flash pulmonary redema with arrythmia: afib with rvr vx vtach.  Started on amiodarone.  Await cardiology evaluation.  Broadus John A Sanita Estrada,MD 01/24/2020 1:14 PM

## 2020-01-24 NOTE — ED Notes (Signed)
Pt report "I can't breath, provider Dr Ralene Bathe aware, suggested Non-rebreater mask. NRB mask applied 10L/min sats improve to 95%

## 2020-01-24 NOTE — Progress Notes (Signed)
[place] for heparin/warfarin Indication: atrial fibrillation  Allergies  Allergen Reactions  . Dextromethorphan-Guaifenesin     UNKNOWN   . Tocotrienols     UNKNOWN   . Losartan Potassium Other (See Comments)    Causes constipation    Patient Measurements: Height: 6\' 2"  (188 cm) Weight: (UTO, FLOOR TO WEIGH, ON STRETCHER) IBW/kg (Calculated) : 82.2 Heparin Dosing Weight: TBW  Vital Signs: Temp: 97.6 F (36.4 C) (02/20 1948) Temp Source: Oral (02/20 1948) BP: 92/62 (02/20 1948) Pulse Rate: 79 (02/20 1948)  Labs: Recent Labs    01/24/20 0712 01/24/20 0910 01/24/20 1907  HGB 9.3*  --   --   HCT 30.4*  --   --   PLT 293  --   --   LABPROT 15.5*  --   --   INR 1.2  --   --   HEPARINUNFRC  --   --  1.02*  CREATININE 8.57*  --   --   TROPONINIHS 55* 70*  --     Estimated Creatinine Clearance: 6.7 mL/min (A) (by C-G formula based on SCr of 8.57 mg/dL (H)).   Medical History: Past Medical History:  Diagnosis Date  . 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 Acute on chronic systolic and diastolic heart failure, NYHA class 4 ([place])   . Anemia, iron deficiency 11/15/2011  . Arthritis    "hands, right knee, feet" (02/21/2018)  . Atrial fibrillation ([place])   . Cancer [name])    hx of prostate; s/p radioactive seed implant 10/2009 Dr Janice Norrie  . Cardiac arrest ([place]) 02/17/2018  . CHF (congestive heart failure) ([place])   . Chronic combined systolic and diastolic heart failure, NYHA class 3 ([place]) 06/2010   felt to be secondary to hypertensive cardiomyopathy  . Chronic lower back pain   . CKD (chronic kidney disease) stage V requiring chronic dialysis ([place])   . ESRD on dialysis [facility])    started 02/2018 [place]  . GERD (gastroesophageal reflux disease)   . Glaucoma    "I had surgery and dont have it any more"  . Gout    daily RX (02/21/2018)  . Heart murmur    "mild" per pt  . Hepatitis    years ago  . HIV  (human immunodeficiency virus infection) ([place])   . HIV infection ([place])   . Hyperlipidemia   . Hypertension    followed by [facility] and Vascular (Dr Dani Gobble Croitoru)  . Hypertension   . Nonischemic cardiomyopathy ([place])   . Pneumonia 11/2017  . Prostate cancer ([place])   . Sinus bradycardia   . Sleep apnea    does not use a cpap  . Stroke [facility])    "mini stroke" years ago  . Wears glasses     Assessment: 28 YOM presenting with CP/SOB from dialysis. On warfarin PTA for afib, INR 1.2 on admission. Chronic anemia stable.    PTA warfarin dosing: 5mg  daily except 2.5mg  on Mon/Fri   PM f/u: heparin level elevated at 1.02.  Appears to have received some bolus doses in HD earlier tonight.  No overt bleeding or complications noted.  Goal of Therapy:  INR 2-3 Heparin level 0.3-0.7 units/ml Monitor platelets by anticoagulation protocol: Yes   Plan:  Reduce IV heparin to 750 units/hr. Heparin level in 8 hours Daily INR/HL, CBC, s/s bleeding  Marguerite Olea, [facility] Clinical Pharmacist Phone [phone]  01/24/2020 8:12 PM

## 2020-01-24 NOTE — ED Notes (Signed)
Pt calling out sts "I can hardly breathe" ... pt O2 sts 94%-96%. HR noticed to be elevated and Ralene Bathe, MD made aware immediately.

## 2020-01-24 NOTE — Progress Notes (Signed)
ANTICOAGULATION CONSULT NOTE - Initial Consult  Pharmacy Consult for heparin/warfarin Indication: atrial fibrillation  Allergies  Allergen Reactions  . Dextromethorphan-Guaifenesin     UNKNOWN   . Tocotrienols     UNKNOWN   . Losartan Potassium Other (See Comments)    Causes constipation    Patient Measurements: Height: 6\' 2"  (188 cm) Weight: 145 lb (65.8 kg) IBW/kg (Calculated) : 82.2 Heparin Dosing Weight: TBW  Vital Signs: Temp: 98.6 F (37 C) (02/20 0716) Temp Source: Oral (02/20 0716) BP: 138/83 (02/20 1015) Pulse Rate: 109 (02/20 1015)  Labs: Recent Labs    01/24/20 0712 01/24/20 0910  HGB 9.3*  --   HCT 30.4*  --   PLT 293  --   LABPROT 15.5*  --   INR 1.2  --   CREATININE 8.57*  --   TROPONINIHS 55* 70*    Estimated Creatinine Clearance: 6.7 mL/min (A) (by C-G formula based on SCr of 8.57 mg/dL (H)).   Medical History: Past Medical History:  Diagnosis Date  . 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 Acute on chronic systolic and diastolic heart failure, NYHA class 4 ([place])   . Anemia, iron deficiency 11/15/2011  . Arthritis    "hands, right knee, feet" (02/21/2018)  . Atrial fibrillation ([place])   . Cancer [name])    hx of prostate; s/p radioactive seed implant 10/2009 Dr Janice Norrie  . Cardiac arrest ([place]) 02/17/2018  . CHF (congestive heart failure) ([place])   . Chronic combined systolic and diastolic heart failure, NYHA class 3 ([place]) 06/2010   felt to be secondary to hypertensive cardiomyopathy  . Chronic lower back pain   . CKD (chronic kidney disease) stage V requiring chronic dialysis ([place])   . ESRD on dialysis [facility])    started 02/2018 [place]  . GERD (gastroesophageal reflux disease)   . Glaucoma    "I had surgery and dont have it any more"  . Gout    daily RX (02/21/2018)  . Heart murmur    "mild" per pt  . Hepatitis    years ago  . HIV (human immunodeficiency virus infection) ([place])   . HIV infection ([place])   .  Hyperlipidemia   . Hypertension    followed by [facility] and Vascular (Dr Dani Gobble Croitoru)  . Hypertension   . Nonischemic cardiomyopathy ([place])   . Pneumonia 11/2017  . Prostate cancer ([place])   . Sinus bradycardia   . Sleep apnea    does not use a cpap  . Stroke [facility])    "mini stroke" years ago  . Wears glasses     Assessment: 32 YOM presenting with CP/SOB from dialysis. On warfarin PTA for afib, INR 1.2 on admission. Chronic anemia stable.    PTA warfarin dosing: 5mg  daily except 2.5mg  on Mon/Fri   Goal of Therapy:  INR 2-3 Heparin level 0.3-0.7 units/ml Monitor platelets by anticoagulation protocol: Yes   Plan:  Heparin 3000 units IV x 1, and gtt at 950 units/hr Warfarin 5 mg PO x 1 today Heparin level in 8 hours Daily INR/HL, CBC, s/s bleeding  Bertis Ruddy, PharmD Clinical Pharmacist Please check AMION for all [place] numbers 01/24/2020 10:28 AM

## 2020-01-24 NOTE — H&P (Signed)
History and Physical    [facility] RCV:[id number] DOB: 1941-09-03 DOA: 01/24/2020  Referring MD/NP/PA: Quintella Reichert, MD PCP: [name], NP  Patient coming from: Home via EMS.  Chief Complaint: Shortness of breath and chest pain  I have personally briefly reviewed patient's old medical records in [place]   HPI: [place] is a 79 y.o. 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  He presents with complaints of shortness of breath and chest pain.  Patient history is somewhat limited as he is currently on BiPAP.  He reports having left-sided chest discomfort that he describes as feeling like a rope was tied around him started yesterday.  He points to his left upper quadrant/lower chest when asked the location of the pain.  He had been at a rehab facility after prolonged hospitalization due to COVID-19 infection.  Patient completed CHENI-77 isolation on 01/01/2020.  He had gone to hemodialysis today, but was transferred to the hospital due to reports of chest discomfort.  He noted having associated symptoms of some nausea and vomiting this morning after he taken his morning medicines.  Denies any recent fevers, cough, diarrhea,  in route with EMS patient was given full dose aspirin.  ED Course: Upon admission into the emergency department patient was noted to be afebrile, went to atrial fibrillation with RVR with heart rates into the 140s, respirations elevated up to 38, O2 saturations maintained currently on a nonrebreather breather.  Labs significant for hemoglobin 9.3, potassium of 3.6, BUN 20, creatinine 8.57, alkaline phosphatase 149, albumin 2.9, INR 1.2, high-sensitivity troponin 55-> 70.  Patient had initially been given diltiazem 15 mg IV x1 dose, but was switched to amiodarone drip.  CT angiogram of the  chest abdomen and pelvis was ordered to evaluate possibility of PE.  Patient was placed on BiPAP due to  Review of Systems  Unable to perform ROS: Severe respiratory distress  Constitutional: Positive for malaise/fatigue. Negative for chills and fever.  HENT: Positive for congestion.   Eyes: Negative for photophobia and pain.  Respiratory: Positive for shortness of breath. Negative for cough.   Cardiovascular: Positive for chest pain. Negative for leg swelling.  Gastrointestinal: Positive for nausea and vomiting.  Genitourinary: Negative for dysuria and hematuria.  Musculoskeletal: Negative for joint pain and myalgias.  Neurological: Negative for loss of consciousness.  Psychiatric/Behavioral: Negative for substance abuse. The patient does not have insomnia.     Past Medical History:  Diagnosis Date  . 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 Acute on chronic systolic and diastolic heart failure, NYHA class 4 ([place])   . Anemia, iron deficiency 11/15/2011  . Arthritis    "hands, right knee, feet" (02/21/2018)  . Atrial fibrillation ([place])   . Cancer [facility])    hx of prostate; s/p radioactive seed implant 10/2009 Dr Janice Norrie  . Cardiac arrest ([place]) 02/17/2018  . CHF (congestive heart failure) ([place])   . Chronic combined systolic and diastolic heart failure, NYHA class 3 ([place]) 06/2010   felt to be secondary to hypertensive cardiomyopathy  . Chronic lower back pain   . CKD (chronic kidney disease) stage V requiring chronic dialysis ([place])   . ESRD on dialysis [facility])    started 02/2018 [place]  . GERD (gastroesophageal reflux disease)   . Glaucoma    "I had surgery and dont  have it any more"  . Gout    daily RX (02/21/2018)  . Heart murmur    "mild" per pt  . Hepatitis    years ago  . HIV (human immunodeficiency virus infection) ([place])   . HIV infection ([place])   . Hyperlipidemia   . Hypertension    followed by [facility] (Dr Dani Gobble Croitoru)  .  Hypertension   . Nonischemic cardiomyopathy ([place])   . Pneumonia 11/2017  . Prostate cancer ([place])   . Sinus bradycardia   . Sleep apnea    does not use a cpap  . Stroke [facility])    "mini stroke" years ago  . Wears glasses     Past Surgical History:  Procedure Laterality Date  . AV FISTULA PLACEMENT Right 10/13/2016   Procedure: ARTERIOVENOUS (AV) FISTULA CREATION;  Surgeon: [name], MD;  Location: [place];  Service: Vascular;  Laterality: Right;  . AV FISTULA PLACEMENT Left 02/25/2018   Procedure: INSERTION OF ARTERIOVENOUS (AV) GORE-TEX GRAFT LEFT UPPER ARM;  Surgeon: Angelia Mould, MD;  Location: [place];  Service: Vascular;  Laterality: Left;  . AV FISTULA PLACEMENT Right 08/07/2018   Procedure: Creation of right arm brachiocephalic Fistula;  Surgeon: Waynetta Sandy, MD;  Location: [place];  Service: Vascular;  Laterality: Right;  . AV FISTULA PLACEMENT Left 11/10/2019   Procedure: INSERTION OF ARTERIOVENOUS (AV) GORE-TEX GRAFT THIGH;  Surgeon: Elam Dutch, MD;  Location: [place];  Service: Vascular;  Laterality: Left;  . AV FISTULA PLACEMENT Right 12/02/2019   Procedure: INSERTION OF ARTERIOVENOUS (AV) GORE-TEX GRAFT RIGHT  THIGH;  Surgeon: Waynetta Sandy, MD;  Location: [place];  Service: Vascular;  Laterality: Right;  . BASCILIC VEIN TRANSPOSITION Left 06/24/2015   Procedure: BASILIC VEIN TRANSPOSITION;  Surgeon: Mal Misty, MD;  Location: [place];  Service: Vascular;  Laterality: Left;  . BIOPSY  03/14/2019   Procedure: BIOPSY;  Surgeon: Irene Shipper, MD;  Location: [place];  Service: Endoscopy;;  . CATARACT EXTRACTION, BILATERAL Bilateral   . COLONOSCOPY WITH PROPOFOL N/A 03/14/2019   Procedure: COLONOSCOPY WITH PROPOFOL;  Surgeon: Irene Shipper, MD;  Location[facility] ENDOSCOPY;  Service: Endoscopy;  Laterality: N/A;  . ESOPHAGOGASTRODUODENOSCOPY (EGD) WITH PROPOFOL N/A 05/05/2014   Procedure: ESOPHAGOGASTRODUODENOSCOPY (EGD) WITH PROPOFOL;  Surgeon:  Missy Sabins, MD;  Location: [facility] ENDOSCOPY;  Service: Endoscopy;  Laterality: N/A;  . EYE SURGERY Bilateral    cataract surgery   . GLAUCOMA SURGERY Bilateral   . INSERTION OF ARTERIOVENOUS (AV) ARTEGRAFT ARM  10/09/2018   Procedure: INSERTION OF ARTERIOVENOUS (AV) 87mm x 41cm ARTEGRAFT LEFT UPPER ARM;  Surgeon: Waynetta Sandy, MD;  Location: [place];  Service: Vascular;;  . INSERTION OF DIALYSIS CATHETER Right 07/14/2019   Procedure: Insertion Of Dialysis Catheter;  Surgeon: Elam Dutch, MD;  Location: [facility] OR;  Service: Vascular;  Laterality: Right;  placed right Internal Jugular  . INSERTION PROSTATE RADIATION SEED    . IR FLUORO GUIDE CV LINE RIGHT  02/18/2018  . IR REMOVAL TUN CV CATH W/O FL  12/06/2018  . IR US GUIDE VASC ACCESS RIGHT  02/18/2018  . LEFT HEART CATH AND CORONARY ANGIOGRAPHY N/A 02/20/2018   Procedure: LEFT HEART CATH AND CORONARY ANGIOGRAPHY;  Surgeon: Jettie Booze, MD;  Location: [place] CV LAB;;; 50% ostial OM2, 25% mLAD.  Marland Kitchen LEFT HEART CATH AND CORONARY ANGIOGRAPHY  2004   mildly depressed LV systolic fx EF 63%,ZCHYIF coronaries/abdominal aorta/renal arteries.  Marland Kitchen  LOWER EXTREMITY ANGIOGRAM Left 11/17/2019   Procedure: Lower Extremity Fistulogram;  Surgeon: Marty Heck, MD;  Location: [place];  Service: Vascular;  Laterality: Left;  . NM [place]  02/21/2010   No ischemia or infarction.  EF 27%.  Marland Kitchen RADIOACTIVE SEED IMPLANT  2010   prostate cancer  . REVISION OF ARTERIOVENOUS GORETEX GRAFT Right 07/11/2019   Procedure: REVISION OF ARTERIOVENOUS GORETEX GRAFT RIGHT ARM WITH ARTEGRAFT;  Surgeon: Serafina Mitchell, MD;  Location: [place];  Service: Vascular;  Laterality: Right;  . SHUNTOGRAM Right 07/14/2019   Procedure: Shuntogram;  Surgeon: Elam Dutch, MD;  Location: [place];  Service: Vascular;  Laterality: Right;  . THROMBECTOMY AND REVISION OF ARTERIOVENTOUS (AV) GORETEX  GRAFT Right 07/14/2019   Procedure: THROMBECTOMY AND  REVISION OF ARTERIOVENTOUS (AV) GORETEX  GRAFT;  Surgeon: Elam Dutch, MD;  Location: [place];  Service: Vascular;  Laterality: Right;  . THROMBECTOMY W/ EMBOLECTOMY Left 02/26/2018   Procedure: THROMBECTOMY ARTERIOVENOUS GRAFT;  Surgeon: Angelia Mould, MD;  Location: [place];  Service: Vascular;  Laterality: Left;  . THROMBECTOMY W/ EMBOLECTOMY Left 11/17/2019   Procedure: THROMBECTOMY ARTERIOVENOUS GRAFT LEFT THIGH;  Surgeon: Marty Heck, MD;  Location: [place];  Service: Vascular;  Laterality: Left;  . TRANSTHORACIC ECHOCARDIOGRAM  02/2012   [facility]) EF 45-50% with mild global HK.  Mild LVH,LA mod. dilated,mild-mod. 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 AI.  Marland Kitchen TRANSTHORACIC ECHOCARDIOGRAM  9/'18; 3/'19   a) Severely reduced EF.  25 from 30%.  GR 1 DD with diffuse ecchymosis. Mild AS & AI.  Mild LA/RA dilation; b) (post PEA arest):   Moderately dilated LV with moderate concentric virtually.  EF 15 to 20%.  GR 1 DD.  Paradoxical septal motion. Mild AI.  Mild LA dilation.  Poorly visualized RV.  Marland Kitchen TRANSTHORACIC ECHOCARDIOGRAM  02/2019   EF 20-25%.  Unable to assess diastolic function (A. Fib).  No LV apical thrombus.  Mild AI.  Mildly reduced RV function, however poorly visualized.  Marland Kitchen ULTRASOUND GUIDANCE FOR VASCULAR ACCESS  02/20/2018   Procedure: Ultrasound Guidance For Vascular Access;  Surgeon: Jettie Booze, MD;  Location: [place] CV LAB;  Service: Cardiovascular;;  . UPPER EXTREMITY VENOGRAPHY N/A 07/08/2018   Procedure: UPPER EXTREMITY VENOGRAPHY;  Surgeon: Waynetta Sandy, MD;  Location: [place] CV LAB;  Service: Cardiovascular;  Laterality: N/A;  Bilateral     reports that he quit smoking about 15 years ago. His smoking use included cigarettes. He has a 30.00 pack-year smoking history. He has never used smokeless tobacco. He reports current alcohol use of about 1.0 standard drinks of alcohol per week. He reports that he does  not use drugs.  Allergies  Allergen Reactions  . Dextromethorphan-Guaifenesin     UNKNOWN   . Tocotrienols     UNKNOWN   . Losartan Potassium Other (See Comments)    Causes constipation    Family History  Problem Relation Age of Onset  . Hypertension Mother   . Thyroid disease Mother   . Cholelithiasis Daughter   . Cholelithiasis Son   . Hypertension Maternal Grandmother   . Diabetes Maternal Grandmother   . Heart attack Neg Hx   . Hyperlipidemia Neg Hx     Prior to Admission medications   Medication Sig Start Date End Date Taking? Authorizing Provider  acetaminophen (TYLENOL) 500 MG tablet Take 1,000 mg by mouth every 6 (six) hours as needed for  moderate pain.    [provider]  allopurinol (ZYLOPRIM) 100 MG tablet 1 tablet by mouth 3x weekly after dialysis. 09/15/19   [name], NP  amiodarone (PACERONE) 200 MG tablet Take 1 tablet (200 mg total) by mouth daily. 05/12/19   Croitoru, Mihai, MD  bictegravir-emtricitabine-tenofovir AF ([name]) 50-200-25 MG TABS tablet Take 1 tablet by mouth daily. 09/29/19   Truman Hayward, MD  brimonidine (ALPHAGAN) 0.15 % ophthalmic solution Place 1 drop into the left eye 2 (two) times daily. 11/15/17   [provider]  dorzolamide-timolol (COSOPT) 22.3-6.8 MG/ML ophthalmic solution Place 1 drop into the left eye 2 (two) times daily. 12/17/17   [provider]  latanoprost (XALATAN) 0.005 % ophthalmic solution Place 1 drop into the left eye at bedtime.     [provider]  loratadine (CLARITIN) 10 MG tablet Take 10 mg by mouth daily.    [provider]  midodrine (PROAMATINE) 10 MG tablet Take 1 tablet (10 mg total) by mouth as directed. Twice a day on the day of HD Tuesday, Thursday, Saturday (1 in AM and 1 at [place]). Take 1/2 tablet (5mg ) a day on non HD days Patient taking differently: Take 10 mg by mouth See admin instructions. Take 1 tablet (10 mg) by mouth 3 times daily on  Tuesdays, Thursdays, & Saturdays. 04/22/19   Leonie Man, MD  montelukast (SINGULAIR) 10 MG tablet Take 1 tablet (10 mg total) by mouth at bedtime. 09/12/19   [name], NP  multivitamin (RENA-VIT) TABS tablet Take 1 tablet by mouth daily at 2 PM.     [provider]  traMADol (ULTRAM) 50 MG tablet Take 1 tablet (50 mg total) by mouth every 6 (six) hours as needed. 01/09/20   Thurnell Lose, MD  VELPHORO 500 MG chewable tablet Chew 500 mg by mouth 3 (three) times daily with meals.  09/29/19   [provider]  warfarin (COUMADIN) 5 MG tablet TAKE 1/2 TO 1 TABLET BY MOUTH DAILY AS DIRECTED BY COUMADIN CLINIC Patient taking differently: Take 2.5-5 mg by mouth See admin instructions. Take 1/2 to 1 tablet by mouth daily as directed by coumadin clinic; 2.5 mg on Mon and Fri, 5 ,g all other days 11/04/19   Leonie Man, MD    Physical Exam:  Constitutional: Elderly male who appears to be in acute respiratory distress Vitals:   01/24/20 0718 01/24/20 0730 01/24/20 0815 01/24/20 0917  BP:  (!) 142/84 (!) 153/91 (!) 177/98  Pulse:  91 92 (!) 147  Resp:  (!) 24 (!) 29 (!) 37  Temp:      TempSrc:      SpO2:  94% 93% 91%  Weight: 65.8 kg     Height: 6\' 2"  (1.88 m)      Eyes: PERRL, lids and conjunctivae normal ENMT: Mucous membranes are moist. Posterior pharynx clear of any exudate or lesions.  Neck: normal, supple, no masses, no thyromegaly.  Positive JVD. Respiratory: Tachypneic with crackles appreciated in both lung fields.  Patient currently on BiPAP only able to talk in shortened sentences. Cardiovascular: Irregular irregular, no murmurs / rubs / gallops.  Trace lower extremity edema. 2+ pedal pulses. No carotid bruits.  Abdomen: no tenderness, no masses palpated. No hepatosplenomegaly. Bowel sounds positive.  Musculoskeletal: no clubbing / cyanosis. No joint deformity upper and lower extremities. Good ROM, no contractures. Normal muscle tone.  Skin: no  rashes, lesions, ulcers. No induration Neurologic: CN 2-12 grossly intact. Sensation  intact, DTR normal. Strength 5/5 in all 4.  Psychiatric: Normal judgment and insight. Alert and oriented x 3. Normal mood.     Labs on Admission: I have personally reviewed following labs and imaging studies  CBC: Recent Labs  Lab 01/24/20 0712  WBC 7.5  NEUTROABS 5.2  HGB 9.3*  HCT 30.4*  MCV 107.0*  PLT 449   Basic Metabolic Panel: Recent Labs  Lab 01/24/20 0712  NA 142  K 3.6  CL 100  CO2 29  GLUCOSE 119*  BUN 20  CREATININE 8.57*  CALCIUM 9.6   GFR: Estimated Creatinine Clearance: 6.7 mL/min (A) (by C-G formula based on SCr of 8.57 mg/dL (H)). Liver Function Tests: Recent Labs  Lab 01/24/20 0712  AST 19  ALT 16  ALKPHOS 149*  BILITOT 0.6  PROT 6.1*  ALBUMIN 2.9*   Recent Labs  Lab 01/24/20 0712  LIPASE 33   No results for input(s): AMMONIA in the last 168 hours. Coagulation Profile: Recent Labs  Lab 01/24/20 0712  INR 1.2   Cardiac Enzymes: No results for input(s): CKTOTAL, CKMB, CKMBINDEX, TROPONINI in the last 168 hours. BNP (last 3 results) No results for input(s): PROBNP in the last 8760 hours. HbA1C: No results for input(s): HGBA1C in the last 72 hours. CBG: No results for input(s): GLUCAP in the last 168 hours. Lipid Profile: No results for input(s): CHOL, HDL, LDLCALC, TRIG, CHOLHDL, LDLDIRECT in the last 72 hours. Thyroid Function Tests: No results for input(s): TSH, T4TOTAL, FREET4, T3FREE, THYROIDAB in the last 72 hours. Anemia Panel: No results for input(s): VITAMINB12, FOLATE, FERRITIN, TIBC, IRON, RETICCTPCT in the last 72 hours. Urine analysis:    Component Value Date/Time   COLORURINE YELLOW 02/17/2018 0540   APPEARANCEUR CLOUDY (A) 02/17/2018 0540   LABSPEC 1.010 02/17/2018 0540   PHURINE 5.0 02/17/2018 0540   GLUCOSEU NEGATIVE 02/17/2018 0540   HGBUR LARGE (A) 02/17/2018 0540   BILIRUBINUR NEGATIVE 02/17/2018 0540   BILIRUBINUR  negative 04/16/2013 1447   KETONESUR NEGATIVE 02/17/2018 0540   PROTEINUR 100 (A) 02/17/2018 0540   UROBILINOGEN 0.2 05/11/2014 1352   NITRITE NEGATIVE 02/17/2018 0540   LEUKOCYTESUR NEGATIVE 02/17/2018 0540   Sepsis Labs: No results found for this or any previous visit (from the past 240 hour(s)).   Radiological Exams on Admission: DG Chest Port 1 View  Result Date: 01/24/2020 CLINICAL DATA:  Left-sided chest pain EXAM: PORTABLE CHEST 1 VIEW COMPARISON:  12/24/2019 FINDINGS: Airspace disease symmetrically at the bases with a few Kerley lines. No visible effusion. Perma catheter on the right with tip in good position. Cardiomegaly. No pneumothorax IMPRESSION: 1. Symmetric pulmonary opacity favoring edema. 2. More prominent cardiomegaly than on recent comparison. Electronically Signed   By: Monte Fantasia M.D.   On: 01/24/2020 07:43    EKG: Independently reviewed.  Sinus rhythm at 93 bpm  Assessment/Plan Acute respiratory failure with hypoxia with pulmonary edema/volume overload: Acute.  Patient presented with complaints of some shortness of breath, but worsened after being noted to go into A. fib with RVR in the emergency department.  Placed on nonrebreather.  Chest x-ray showing asymmetric pulmonary opacities concerning for edema.  Given subtherapeutic INR with atrial fibrillation question the possibility of blood clot. -Admit to a progressive bed -Continuous pulse oximetry with oxygen as needed to maintain O2 saturations -Follow-up CT angiogram of the chest, abdomen, and pelvis  -HD per nephrology   A. fib with RVR, subtherapeutic INR: Acute on chronic.  Patient has chronic atrial fibrillation, but  acutely found to have heart rates into the 140s.  He is on amiodarone, but throughout this medicine this morning.  Anticoagulation of Coumadin, but INR subtherapeutic at 1.2.  Patient had initially been given diltiazem 15 mg IV. CHA2DS2-VASc score = 6 (Age, CHF, HTN, and previous CVA).  Patient  would warrant bridging. -Heparin and Coumadin per pharmacy -Continue amiodarone drip -Restart amiodarone for home dose in a.m. if off BiPAP  Left-sided chest/left upper quadrant pain, elevated troponin: Acute.  Patient presents with left-sided pain he describes as pressure like.  High-sensitivity troponin 55 and repeat 70.  Suspect this could be secondary to demand but not clear.  Patient had been given full dose aspirin in route with EMS. -Follow-up CT angiogram of the chest abdomen and pelvis  ESRD on HD: Patient normally dialyzes Monday, Tuesday, Thursday, Saturday.  He had gone to hemodialysis this morning, but then sent to the hospital after complaints of chest pain. Labs revealed potassium 3.6, BUN 20, and creatinine 8.57. Had left thigh AV graft placed by Dr. Eden Lathe on 11/10/2019, unfortunately this thrombosed and underwent thrombectomy by Dr. Carlis Abbott on 12/14.  Eventually underwent a right SFA to common femoral vein AV loop graft by Dr. Donzetta Matters 12/29/ -Abbott nephrology consultative services,  will follow-up for further recommendations  Elevated troponin: High-sensitivity troponin 55-> 70.  Suspect secondary to demand with acute respiratory distress.  COVID-19 virus infection: Patient diagnosed on 1/7, and out of the infectious window since 1/28.  Presentation today does not appear to be related.  Macrocytic anemia: Chronic.  Hemoglobin 9.3 which appears near patient's baseline. -Continue to monitor   History of HIV: CD4 T-cell absolute count 406 on 2/17. 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 -Continue outpatient follow-up with infectious disease  Elevated alkaline phosphatase: Acute. -Continue to monitor  DNR: Present on admission  DVT prophylaxis: Heparin Code Status:  DNR Family Communication: Wife updated over the phone Disposition Plan: Likely we will need discharge back to rehab once medically stable Consults  called: Nephrology  Admission status: Inpatient   Norval Morton MD [facility] Pager [phone]   If 7PM-7AM, please contact night-coverage [email or web address] Password [facility]  01/24/2020, 9:31 AM

## 2020-01-25 ENCOUNTER — Inpatient Hospital Stay (HOSPITAL_COMMUNITY): Payer: Medicare HMO

## 2020-01-25 DIAGNOSIS — J9601 Acute respiratory failure with hypoxia: Secondary | ICD-10-CM

## 2020-01-25 LAB — COMPREHENSIVE METABOLIC PANEL
ALT: 16 U/L (ref 0–44)
AST: 16 U/L (ref 15–41)
Albumin: 2.7 g/dL — ABNORMAL LOW (ref 3.5–5.0)
Alkaline Phosphatase: 129 U/L — ABNORMAL HIGH (ref 38–126)
Anion gap: 12 (ref 5–15)
BUN: 15 mg/dL (ref 8–23)
CO2: 27 mmol/L (ref 22–32)
Calcium: 9 mg/dL (ref 8.9–10.3)
Chloride: 101 mmol/L (ref 98–111)
Creatinine, Ser: 5.97 mg/dL — ABNORMAL HIGH (ref 0.61–1.24)
GFR calc Af Amer: 10 mL/min — ABNORMAL LOW (ref >60)
GFR calc non Af Amer: 8 mL/min — ABNORMAL LOW (ref >60)
Glucose, Bld: 99 mg/dL (ref 70–99)
Potassium: 4.6 mmol/L (ref 3.5–5.1)
Sodium: 140 mmol/L (ref 135–145)
Total Bilirubin: 0.5 mg/dL (ref 0.3–1.2)
Total Protein: 5.8 g/dL — ABNORMAL LOW (ref 6.5–8.1)

## 2020-01-25 LAB — CBC
HCT: 30.6 % — ABNORMAL LOW (ref 39.0–52.0)
Hemoglobin: 9.3 g/dL — ABNORMAL LOW (ref 13.0–17.0)
MCH: 32.9 pg (ref 26.0–34.0)
MCHC: 30.4 g/dL (ref 30.0–36.0)
MCV: 108.1 fL — ABNORMAL HIGH (ref 80.0–100.0)
Platelets: 283 10*3/uL (ref 150–400)
RBC: 2.83 MIL/uL — ABNORMAL LOW (ref 4.22–5.81)
RDW: 19.5 % — ABNORMAL HIGH (ref 11.5–15.5)
WBC: 8 10*3/uL (ref 4.0–10.5)
nRBC: 0.4 % — ABNORMAL HIGH (ref 0.0–0.2)

## 2020-01-25 LAB — MRSA PCR SCREENING: MRSA by PCR: NEGATIVE

## 2020-01-25 LAB — PROTIME-INR
INR: 1.4 — ABNORMAL HIGH (ref 0.8–1.2)
Prothrombin Time: 17 seconds — ABNORMAL HIGH (ref 11.4–15.2)

## 2020-01-25 LAB — HEPARIN LEVEL (UNFRACTIONATED)
Heparin Unfractionated: 0.26 IU/mL — ABNORMAL LOW (ref 0.30–0.70)
Heparin Unfractionated: 0.3 IU/mL (ref 0.30–0.70)
Heparin Unfractionated: 0.24 IU/mL — ABNORMAL LOW (ref 0.30–0.70)

## 2020-01-25 MED ORDER — WARFARIN SODIUM 5 MG PO TABS
5.0000 mg | ORAL_TABLET | Freq: Once | ORAL | Status: AC
  Administered 2020-01-25: 5 mg via ORAL
  Filled 2020-01-25: qty 1

## 2020-01-25 MED ORDER — IOHEXOL 350 MG/ML SOLN
100.0000 mL | Freq: Once | INTRAVENOUS | Status: AC | PRN
  Administered 2020-01-25: 100 mL via INTRAVENOUS

## 2020-01-25 MED ORDER — DOCUSATE SODIUM 100 MG PO CAPS
100.0000 mg | ORAL_CAPSULE | Freq: Every day | ORAL | Status: DC | PRN
  Administered 2020-01-26: 100 mg via ORAL
  Filled 2020-01-25: qty 1

## 2020-01-25 MED ORDER — AMIODARONE HCL 200 MG PO TABS
200.0000 mg | ORAL_TABLET | Freq: Every day | ORAL | Status: DC
  Administered 2020-01-25 – 2020-01-27 (×3): 200 mg via ORAL
  Filled 2020-01-25 (×4): qty 1

## 2020-01-25 NOTE — Progress Notes (Signed)
[place] for heparin Indication: atrial fibrillation  Assessment: 32 YOM presenting with CP/SOB from dialysis. On warfarin PTA for afib, INR 1.2 on admission. Chronic anemia stable.    PTA warfarin dosing: 5mg  daily except 2.5mg  on Mon/Fri   Heparin level 0.26 units/ml.  No issues noted with infusion.  Goal of Therapy:  INR 2-3 Heparin level 0.3-0.7 units/ml Monitor platelets by anticoagulation protocol: Yes   Plan:  Increase IV heparin to 850 units/hr. Heparin level in 6-8 hours  Thanks for allowing pharmacy to be a part of this patient's care.  Excell Seltzer, PharmD Clinical Pharmacist  01/25/2020 6:52 AM

## 2020-01-25 NOTE — Progress Notes (Signed)
ANTICOAGULATION CONSULT NOTE - Follow Up Consult  Pharmacy Consult for Heparin, Coumadin Indication: atrial fibrillation  Allergies  Allergen Reactions  . Dextromethorphan-Guaifenesin     UNKNOWN   . Tocotrienols     UNKNOWN   . Losartan Potassium Other (See Comments)    Causes constipation    Patient Measurements: Height: 6\' 2"  (188 cm) Weight: 170 lb 3.2 oz (77.2 kg) IBW/kg (Calculated) : 82.2  Vital Signs: Temp: 98.1 F (36.7 C) (02/21 0454) Temp Source: Oral (02/21 0454) BP: 125/75 (02/21 0820) Pulse Rate: 70 (02/21 0820)  Labs: Recent Labs    01/24/20 0712 01/24/20 0910 01/24/20 1907 01/24/20 1907 01/25/20 0600 01/25/20 1003 01/25/20 1403  HGB 9.3*  --   --   --  9.3*  --   --   HCT 30.4*  --   --   --  30.6*  --   --   PLT 293  --   --   --  283  --   --   LABPROT 15.5*  --   --   --  17.0*  --   --   INR 1.2  --   --   --  1.4*  --   --   HEPARINUNFRC  --   --  1.02*   < > 0.26* 0.30 0.24*  CREATININE 8.57*  --   --   --  5.97*  --   --   TROPONINIHS 55* 70* 110*  --   --   --   --    < > = values in this interval not displayed.    Estimated Creatinine Clearance: 11.3 mL/min (A) (by C-G formula based on SCr of 5.97 mg/dL (H)).  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 On warfarin PTA for afib, Chronic anemia stable.  Hgb 9.3. INR up to 1.4. Hep level 0.24 slightly low. -PTA warfarin dosing: 5mg  daily except 2.5mg  on Mon/Fri. INR 1.2 on admit.  Goal of Therapy:  Heparin level 0.3-0.7 units/ml  INR 2-3 Monitor platelets by anticoagulation protocol: Yes   Plan:  IV heparin increase to 950 units/hr Warfarin 5 mg PO x 1 today Daily INR/HL, CBC, s/s bleeding   Versa Craton S. Alford Highland, PharmD, BCPS Clinical Staff Pharmacist [email or web address] Alford Highland, [employer] 01/25/2020,2:38 PM

## 2020-01-25 NOTE — Progress Notes (Signed)
PROGRESS NOTE    [facility]  ZOX:[id number] DOB: 05/15/1941 DOA: 01/24/2020 PCP: [name], NP  Brief Narrative: David [name] is a 79 y.o. 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  He presented with complaints of shortness of breath and chest pain, which was described as feeling like a rope was tied around him started 1 day ago.   - He had been at a rehab facility after prolonged hospitalization due to COVID-19 infection.  Patient completed [id number] isolation on 01/01/2020.  He had gone to hemodialysis 2/20, but was transferred to the hospital due to reports of chest discomfort.  -Work-up in the emergency room noted fluid overload and was also found to be in A. fib RVR  Assessment & Plan:   Acute hypoxic respiratory failure Pulmonary edema, pleural effusions -Due to ongoing weight loss, needs dry weight lowered -Off BiPAP, improving post dialysis yesterday -Nephrology following -Wean O2, CTA chest negative for PE, noted bilateral pleural effusions -Volume management per renal  Atrial fibrillation with RVR -Triggered by pulmonary edema, known history of A. fib on amiodarone -Was started on IV amiodarone yesterday while on BiPAP, converted to sinus rhythm yesterday after dialysis, will switch to home dose of oral amiodarone -Continue Coumadin, INR subtherapeutic hands on heparin bridge as well  ESRD on hemodialysis -On dialysis Monday Tuesday Thursday Saturday -History of clotted right femoral AV graft, currently using a permacath -Per renal  Elevated troponin:  -High-sensitivity troponin 55-> 70.  Suspect secondary to demand with acute pulmonary edema -No evidence of ACS, denies chest pain today  Recent COVID-19 virus infection: Patient diagnosed on 1/7, and out of the infectious window since 1/28.    Macrocytic anemia: Chronic.  Hemoglobin 9.3 which appears near patient's baseline. -Continue to monitor   History of HIV: CD4 T-cell absolute count 406 on 2/17. 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 -Continue outpatient follow-up with infectious disease   DVT prophylaxis: Heparin Code Status:  DNR Family Communication:  No family at bedside will update wife Disposition Plan:  Back to rehab, likely in 48 hours  Consultants:   Renal   Procedures:   Antimicrobials:    Subjective: Breathing better after dialysis, denies any chest pain, no nausea vomiting, no fevers or chills  Objective: Vitals:   01/24/20 1815 01/24/20 1948 01/25/20 0109 01/25/20 0454  BP: 116/64 92/62 114/67 129/71  Pulse: 77 79  81  Resp:    18  Temp:  97.6 F (36.4 C)  98.1 F (36.7 C)  TempSrc:  Oral  Oral  SpO2:  99%  97%  Weight:    77.2 kg  Height:        Intake/Output Summary (Last 24 hours) at 01/25/2020 0940 Last data filed at 01/24/2020 2000 Gross per 24 hour  Intake 180 ml  Output 4000 ml  Net -3820 ml   Filed Weights   01/24/20 0718 01/25/20 0454  Weight: 65.8 kg 77.2 kg    Examination:  General exam: Chronically ill elderly male sitting up in bed, awake alert oriented x2, no distress Respiratory system: Decreased breath sounds at both bases, R IJ HD cath Cardiovascular system: S1 & S2 heard, RRR. Gastrointestinal system: Abdomen is nondistended, soft and nontender.Normal bowel sounds heard. Central nervous system: Alert and oriented. No focal neurological  deficits. Extremities: no edema Skin: No rashes on visible skin Psychiatry:  Mood & affect appropriate.     Data Reviewed:   CBC: Recent Labs  Lab 01/24/20 0712 01/25/20 0600  WBC 7.5 8.0  NEUTROABS 5.2  --   HGB 9.3* 9.3*  HCT 30.4* 30.6*  MCV 107.0* 108.1*  PLT 293 163   Basic Metabolic Panel: Recent Labs  Lab 01/24/20 0712  01/25/20 0600  NA 142 140  K 3.6 4.6  CL 100 101  CO2 29 27  GLUCOSE 119* 99  BUN 20 15  CREATININE 8.57* 5.97*  CALCIUM 9.6 9.0   GFR: Estimated Creatinine Clearance: 11.3 mL/min (A) (by C-G formula based on SCr of 5.97 mg/dL (H)). Liver Function Tests: Recent Labs  Lab 01/24/20 0712 01/25/20 0600  AST 19 16  ALT 16 16  ALKPHOS 149* 129*  BILITOT 0.6 0.5  PROT 6.1* 5.8*  ALBUMIN 2.9* 2.7*   Recent Labs  Lab 01/24/20 0712  LIPASE 33   No results for input(s): AMMONIA in the last 168 hours. Coagulation Profile: Recent Labs  Lab 01/24/20 0712 01/25/20 0600  INR 1.2 1.4*   Cardiac Enzymes: No results for input(s): CKTOTAL, CKMB, CKMBINDEX, TROPONINI in the last 168 hours. BNP (last 3 results) No results for input(s): PROBNP in the last 8760 hours. HbA1C: No results for input(s): HGBA1C in the last 72 hours. CBG: No results for input(s): GLUCAP in the last 168 hours. Lipid Profile: No results for input(s): CHOL, HDL, LDLCALC, TRIG, CHOLHDL, LDLDIRECT in the last 72 hours. Thyroid Function Tests: No results for input(s): TSH, T4TOTAL, FREET4, T3FREE, THYROIDAB in the last 72 hours. Anemia Panel: No results for input(s): VITAMINB12, FOLATE, FERRITIN, TIBC, IRON, RETICCTPCT in the last 72 hours. Urine analysis:    Component Value Date/Time   COLORURINE YELLOW 02/17/2018 0540   APPEARANCEUR CLOUDY (A) 02/17/2018 0540   LABSPEC 1.010 02/17/2018 0540   PHURINE 5.0 02/17/2018 0540   GLUCOSEU NEGATIVE 02/17/2018 0540   HGBUR LARGE (A) 02/17/2018 0540   BILIRUBINUR NEGATIVE 02/17/2018 0540   BILIRUBINUR negative 04/16/2013 1447   KETONESUR NEGATIVE 02/17/2018 0540   PROTEINUR 100 (A) 02/17/2018 0540   UROBILINOGEN 0.2 05/11/2014 1352   NITRITE NEGATIVE 02/17/2018 0540   LEUKOCYTESUR NEGATIVE 02/17/2018 0540   Sepsis Labs: @LABRCNTIP (procalcitonin:4,lacticidven:4)  )No results found for this or any previous visit (from the past 240 hour(s)).        Radiology Studies: CT Angio Chest PE W/Cm &/Or Wo Cm  Result Date: 01/25/2020 CLINICAL DATA:  Left upper quadrant pain. Shortness of breath. EXAM: CT ANGIOGRAPHY CHEST CT ABDOMEN AND PELVIS WITH CONTRAST TECHNIQUE: Multidetector CT imaging of the chest was performed using the standard protocol during bolus administration of intravenous contrast. Multiplanar CT image reconstructions and MIPs were obtained to evaluate the vascular anatomy. Multidetector CT imaging of the abdomen and pelvis was performed using the standard protocol during bolus administration of intravenous contrast. CONTRAST:  122mL OMNIPAQUE IOHEXOL 350 MG/ML SOLN COMPARISON:  01/14/2019. FINDINGS: CTA CHEST FINDINGS Cardiovascular: There is no acute pulmonary embolism. The main pulmonary artery is significantly dilated measuring approximately 4.9 cm in diameter. There is a thoracic aortic aneurysm involving the ascending aorta currently measuring approximately 4.6 cm. There are minimal atherosclerotic changes of the thoracic aorta. There is cardiomegaly. There is no significant pericardial effusion. Coronary artery calcifications are noted. Is a well-positioned tunneled dialysis catheter with tip terminating near the cavoatrial junction. There is a small amount of adherent thrombus to the midportion of  the catheter. Mediastinum/Nodes: --No mediastinal or hilar lymphadenopathy. --No axillary lymphadenopathy. --No supraclavicular lymphadenopathy. --Normal thyroid gland. --The esophagus is unremarkable Lungs/Pleura: There are small to moderate-sized bilateral pleural effusions. There are patchy bilateral ground-glass airspace opacities, greatest at the lung bases bilaterally. There is no pneumothorax. The trachea is unremarkable. Musculoskeletal: No chest wall abnormality. No acute or significant osseous findings. Review of the MIP images confirms the above findings. CT ABDOMEN and PELVIS FINDINGS Hepatobiliary: The liver is normal.  Normal gallbladder.There is no biliary ductal dilation. Pancreas: Normal contours without ductal dilatation. No peripancreatic fluid collection. Spleen: No splenic laceration or hematoma. Adrenals/Urinary Tract: --Adrenal glands: No adrenal hemorrhage. --Right kidney/ureter: There multiple hyperdense right renal cysts that appear grossly stable since prior study. There is no hydronephrosis. --Left kidney/ureter: There hyperdense left renal cyst that appears stable since prior study in 2020. There is no hydronephrosis. --Urinary bladder: The bladder is decompressed which limits evaluation. Stomach/Bowel: --Stomach/Duodenum: No hiatal hernia or other gastric abnormality. Normal duodenal course and caliber. --Small bowel: No dilatation or inflammation. --Colon: No focal abnormality. --Appendix: Normal. Vascular/Lymphatic: Atherosclerotic calcification is present within the non-aneurysmal abdominal aorta, without hemodynamically significant stenosis. There appear to be bilateral occluded lower extremity dialysis grafts. There is a small fluid collection surrounding the right femoral vasculature measuring approximately 2.3 cm. This is favored to represent a postoperative seroma or hematoma. --No retroperitoneal lymphadenopathy. --No mesenteric lymphadenopathy. --No pelvic or inguinal lymphadenopathy. 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 There is a fluid density mass in the right inguinal canal which is stable across prior studies. Other: No ascites or free air. The abdominal wall is normal. Musculoskeletal. There are end-stage degenerative changes of the left hip. There are moderate degenerative changes of the right hip. Review of the MIP images confirms the above findings. IMPRESSION: 1. No acute pulmonary embolism. 2. Significantly dilated main pulmonary artery which can be seen in patients with pulmonary artery hypertension. 3. Small to moderate-sized bilateral pleural effusions  with patchy bilateral ground-glass airspace opacities. This may be related to pulmonary edema or an atypical infectious process. 4. Ascending thoracic aortic aneurysm measuring approximately 4.6 cm. Ascending thoracic aortic aneurysm. Recommend semi-annual imaging followup by CTA or MRA and referral to cardiothoracic surgery if not already obtained. This recommendation follows 2010 ACCF/AHA/AATS/ACR/ASA/SCA/SCAI/SIR/STS/SVM Guidelines for the Diagnosis and Management of Patients With Thoracic Aortic Disease. Circulation. 2010; 121: Y865-H846. Aortic aneurysm NOS (ICD10-I71.9) 5. Bilateral occluded lower extremity dialysis grafts with small fluid collection surrounding the right femoral vasculature. This is favored to represent a postoperative seroma or hematoma. 6. Stable bilateral hyperdense renal cysts. 7. 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 Aortic Atherosclerosis (ICD10-I70.0). Electronically Signed   By: Constance Holster M.D.   On: 01/25/2020 02:42   CT Abdomen Pelvis W Contrast  Result Date: 01/25/2020 CLINICAL DATA:  Left upper quadrant pain. Shortness of breath. EXAM: CT ANGIOGRAPHY CHEST CT ABDOMEN AND PELVIS WITH CONTRAST TECHNIQUE: Multidetector CT imaging of the chest was performed using the standard protocol during bolus administration of intravenous contrast. Multiplanar CT image reconstructions and MIPs were obtained to evaluate the vascular anatomy. Multidetector CT imaging of the abdomen and pelvis was performed using the standard protocol during bolus administration of intravenous contrast. CONTRAST:  155mL OMNIPAQUE IOHEXOL 350 MG/ML SOLN COMPARISON:  01/14/2019. FINDINGS: CTA CHEST FINDINGS Cardiovascular: There is no acute pulmonary embolism. The main pulmonary artery is significantly dilated measuring approximately 4.9 cm in diameter. There is a  thoracic aortic aneurysm involving the ascending aorta currently measuring  approximately 4.6 cm. There are minimal atherosclerotic changes of the thoracic aorta. There is cardiomegaly. There is no significant pericardial effusion. Coronary artery calcifications are noted. Is a well-positioned tunneled dialysis catheter with tip terminating near the cavoatrial junction. There is a small amount of adherent thrombus to the midportion of the catheter. Mediastinum/Nodes: --No mediastinal or hilar lymphadenopathy. --No axillary lymphadenopathy. --No supraclavicular lymphadenopathy. --Normal thyroid gland. --The esophagus is unremarkable Lungs/Pleura: There are small to moderate-sized bilateral pleural effusions. There are patchy bilateral ground-glass airspace opacities, greatest at the lung bases bilaterally. There is no pneumothorax. The trachea is unremarkable. Musculoskeletal: No chest wall abnormality. No acute or significant osseous findings. Review of the MIP images confirms the above findings. CT ABDOMEN and PELVIS FINDINGS Hepatobiliary: The liver is normal. Normal gallbladder.There is no biliary ductal dilation. Pancreas: Normal contours without ductal dilatation. No peripancreatic fluid collection. Spleen: No splenic laceration or hematoma. Adrenals/Urinary Tract: --Adrenal glands: No adrenal hemorrhage. --Right kidney/ureter: There multiple hyperdense right renal cysts that appear grossly stable since prior study. There is no hydronephrosis. --Left kidney/ureter: There hyperdense left renal cyst that appears stable since prior study in 2020. There is no hydronephrosis. --Urinary bladder: The bladder is decompressed which limits evaluation. Stomach/Bowel: --Stomach/Duodenum: No hiatal hernia or other gastric abnormality. Normal duodenal course and caliber. --Small bowel: No dilatation or inflammation. --Colon: No focal abnormality. --Appendix: Normal. Vascular/Lymphatic: Atherosclerotic calcification is present within the non-aneurysmal abdominal aorta, without hemodynamically  significant stenosis. There appear to be bilateral occluded lower extremity dialysis grafts. There is a small fluid collection surrounding the right femoral vasculature measuring approximately 2.3 cm. This is favored to represent a postoperative seroma or hematoma. --No retroperitoneal lymphadenopathy. --No mesenteric lymphadenopathy. --No pelvic or inguinal lymphadenopathy. 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 There is a fluid density mass in the right inguinal canal which is stable across prior studies. Other: No ascites or free air. The abdominal wall is normal. Musculoskeletal. There are end-stage degenerative changes of the left hip. There are moderate degenerative changes of the right hip. Review of the MIP images confirms the above findings. IMPRESSION: 1. No acute pulmonary embolism. 2. Significantly dilated main pulmonary artery which can be seen in patients with pulmonary artery hypertension. 3. Small to moderate-sized bilateral pleural effusions with patchy bilateral ground-glass airspace opacities. This may be related to pulmonary edema or an atypical infectious process. 4. Ascending thoracic aortic aneurysm measuring approximately 4.6 cm. Ascending thoracic aortic aneurysm. Recommend semi-annual imaging followup by CTA or MRA and referral to cardiothoracic surgery if not already obtained. This recommendation follows 2010 ACCF/AHA/AATS/ACR/ASA/SCA/SCAI/SIR/STS/SVM Guidelines for the Diagnosis and Management of Patients With Thoracic Aortic Disease. Circulation. 2010; 121: H209-O709. Aortic aneurysm NOS (ICD10-I71.9) 5. Bilateral occluded lower extremity dialysis grafts with small fluid collection surrounding the right femoral vasculature. This is favored to represent a postoperative seroma or hematoma. 6. Stable bilateral hyperdense renal cysts. 7. 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 Aortic  Atherosclerosis (ICD10-I70.0). Electronically Signed   By: Constance Holster M.D.   On: 01/25/2020 02:42   DG Chest Port 1 View  Result Date: 01/24/2020 CLINICAL DATA:  Left-sided chest pain EXAM: PORTABLE CHEST 1 VIEW COMPARISON:  12/24/2019 FINDINGS: Airspace disease symmetrically at the bases with a few Kerley lines. No visible effusion. Perma catheter on the right with tip in good position. Cardiomegaly. No pneumothorax IMPRESSION: 1. Symmetric pulmonary opacity favoring edema.  2. More prominent cardiomegaly than on recent comparison. Electronically Signed   By: Monte Fantasia M.D.   On: 01/24/2020 07:43        Scheduled Meds: . allopurinol  100 mg Oral Daily  . amiodarone  200 mg Oral Daily  . bictegravir-emtricitabine-tenofovir AF  1 tablet Oral Daily  . brimonidine  1 drop Left Eye BID  . Chlorhexidine Gluconate Cloth  6 each Topical Daily  . dorzolamide-timolol  1 drop Left Eye BID  . latanoprost  1 drop Left Eye QHS  . loratadine  10 mg Oral Daily  . midodrine  10 mg Oral TID WC  . montelukast  10 mg Oral QHS  . multivitamin  1 tablet Oral QHS  . sodium chloride flush  3 mL Intravenous Q12H  . sucralfate  1 g Oral Once  . sucroferric oxyhydroxide  500 mg Oral TID WC  . Warfarin - Pharmacist Dosing Inpatient   Does not apply q1800   Continuous Infusions: . heparin 850 Units/hr (01/25/20 0655)     LOS: 1 day    Time spent: 31min  Domenic Polite, MD [facility]  01/25/2020, 9:40 AM

## 2020-01-25 NOTE — Progress Notes (Addendum)
Subjective:  tolerated   Hd yesterday   ,feels better, no cp or sob,No cos eating lunch   Objective Vital signs in last 24 hours: Vitals:   01/24/20 1948 01/25/20 0109 01/25/20 0454 01/25/20 0820  BP: 92/62 114/67 129/71 125/75  Pulse: 79  81 70  Resp:   18   Temp: 97.6 F (36.4 C)  98.1 F (36.7 C)   TempSrc: Oral  Oral   SpO2: 99%  97% 97%  Weight:   77.2 kg   Height:       Weight change:   Physical Exam: General: alert NAD  Heart:  RRR , no  Rub, mur  Lungs: CTA  Unlabored breathing  Abdomen: soft , NT, ND Extremities: no pedal edema  Dialysis Access: R Fem avgg clotted ,no bruit, R  IJ perm cath     OP Dialysis Orders: Center: [facility]  on [facility] Sat . EDW 79.5 (below edw last tx) HD Bath 2k, 2ca  Time 4hr Heparin 2000. Access R IJ Perm cath / R Fem AVGG (clotted Aleene Davidson for ckv  Wed 01/28/20)     Calcitriol 0.5  mcg po HD  Mircera 139mcg (last given 01/17/20 )     Problem/Plan: 1. Volume overload / Pulmonary edema =  4 l uf  On HD yest 02/20 , now Rm  Air 97%  /off BiPAP  needs lower edw at dc /leaving below edw as op (wt loss sp COVID last month) 2. Rapid Afib/ vs Vtach = per admit /was on amio/comadin as op / now SR 3. ESRD -  Normal schedule  MonTueThurs Sat hd , HD next tomor.k4.6 4. Chest Pain = wu per admit ,?related to 1. +2.    5. Hypertension/volume  - using Midodrine pre hd / bp stable 6. Anemia of ESRD   -  HGb 9.3>9.3  ESA due 27th   7. Metabolic bone disease -  Ca corec sl >10  Using 2 .0 ca bath  Hold Calcitriol until ca decreased 8. Clotted R fem AVGG - using perm cath now ,as DW Dr Marval Regal  Have IR declott tomorrow  9. 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 CBC: Recent Labs  Lab 01/24/20 0712 01/25/20 0600  WBC 7.5 8.0  NEUTROABS 5.2  --   HGB 9.3* 9.3*  HCT 30.4* 30.6*  MCV 107.0* 108.1*  PLT 293 283   Cardiac Enzymes: No results for input(s): CKTOTAL, CKMB, CKMBINDEX, TROPONINI in the last 168 hours. CBG: No results for input(s): GLUCAP in the last 168 hours.  Studies/Results: CT Angio Chest PE W/Cm &/Or Wo Cm  Result Date:  01/25/2020 CLINICAL DATA:  Left upper quadrant pain. Shortness of breath. EXAM: CT ANGIOGRAPHY CHEST CT ABDOMEN AND PELVIS WITH CONTRAST TECHNIQUE: Multidetector CT imaging of the chest was performed using the standard protocol during bolus administration of intravenous contrast. Multiplanar CT image reconstructions and MIPs were obtained to evaluate the vascular anatomy. Multidetector CT imaging of the abdomen and pelvis was performed using the standard protocol during bolus administration of intravenous contrast. CONTRAST:  178mL OMNIPAQUE IOHEXOL 350 MG/ML SOLN COMPARISON:  01/14/2019. FINDINGS: CTA CHEST FINDINGS Cardiovascular: There is no acute pulmonary embolism. The main pulmonary artery is significantly dilated measuring approximately 4.9 cm in diameter. There is [name] thoracic aortic aneurysm involving the ascending aorta currently measuring approximately 4.6 cm. There are minimal atherosclerotic changes of the thoracic aorta. There is cardiomegaly. There is no significant pericardial effusion. Coronary artery calcifications are noted. Is [name] well-positioned tunneled dialysis catheter with tip terminating near the cavoatrial junction. There is [name]  small amount of adherent thrombus to the midportion of the catheter. Mediastinum/Nodes: --No mediastinal or hilar lymphadenopathy. --No axillary lymphadenopathy. --No supraclavicular lymphadenopathy. --Normal thyroid gland. --The esophagus is unremarkable Lungs/Pleura: There are small to moderate-sized bilateral pleural effusions. There are patchy bilateral ground-glass airspace opacities, greatest at the lung bases bilaterally. There is no pneumothorax. The trachea is unremarkable. Musculoskeletal: No chest wall abnormality. No acute or significant osseous findings. Review of the MIP images confirms the above findings. CT ABDOMEN and PELVIS FINDINGS Hepatobiliary: The liver is normal. Normal gallbladder.There is no biliary ductal dilation. Pancreas: Normal contours without ductal dilatation. No peripancreatic fluid collection. Spleen: No splenic laceration or hematoma. Adrenals/Urinary Tract: --Adrenal glands: No adrenal hemorrhage. --Right kidney/ureter: There multiple hyperdense right renal cysts that appear grossly stable since prior study. There is no hydronephrosis. --Left kidney/ureter: There hyperdense left renal cyst that appears stable since prior study in 2020. There is no hydronephrosis. --Urinary bladder: The bladder is decompressed which limits evaluation. Stomach/Bowel: --Stomach/Duodenum: No hiatal hernia or other gastric abnormality. Normal duodenal course and caliber. --Small bowel: No dilatation or inflammation. --Colon: No focal abnormality. --Appendix: Normal. Vascular/Lymphatic: Atherosclerotic calcification is present within the non-aneurysmal abdominal aorta, without hemodynamically significant stenosis. There appear to be bilateral occluded lower extremity dialysis grafts. There is [name] small fluid collection surrounding the right femoral vasculature measuring approximately 2.3 cm. This is favored to represent [name] postoperative seroma or hematoma. --No retroperitoneal lymphadenopathy. --No  mesenteric lymphadenopathy. --No pelvic or inguinal lymphadenopathy. 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 There is [name] fluid density mass in the right inguinal canal which is stable across prior studies. Other: No ascites or free air. The abdominal wall is normal. Musculoskeletal. There are end-stage degenerative changes of the left hip. There are moderate degenerative changes of the right hip. Review of the MIP images confirms the above findings. IMPRESSION: 1. No acute pulmonary embolism. 2. Significantly dilated main pulmonary artery which can be seen in patients with pulmonary artery hypertension. 3. Small to moderate-sized bilateral pleural effusions with patchy bilateral ground-glass airspace opacities. This may be related to pulmonary edema or an atypical infectious process. 4. Ascending thoracic aortic aneurysm measuring approximately 4.6 cm. Ascending thoracic aortic aneurysm. Recommend semi-annual imaging followup by CTA or MRA and referral to cardiothoracic surgery if not already obtained. This recommendation follows 2010 ACCF/AHA/AATS/ACR/ASA/SCA/SCAI/SIR/STS/SVM Guidelines for the Diagnosis and Management of Patients With Thoracic Aortic Disease. Circulation. 2010; 121: E720-N470. Aortic aneurysm NOS (ICD10-I71.9) 5. Bilateral occluded lower extremity dialysis grafts with small fluid collection surrounding the right femoral  vasculature. This is favored to represent [name] postoperative seroma or hematoma. 6. Stable bilateral hyperdense renal cysts. 7. 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 Aortic Atherosclerosis (ICD10-I70.0). Electronically Signed   By: Constance Holster M.D.   On: 01/25/2020 02:42   CT Abdomen Pelvis W Contrast  Result Date: 01/25/2020 CLINICAL DATA:  Left upper quadrant pain. Shortness of breath. EXAM: CT ANGIOGRAPHY CHEST CT ABDOMEN AND PELVIS WITH CONTRAST TECHNIQUE: Multidetector CT  imaging of the chest was performed using the standard protocol during bolus administration of intravenous contrast. Multiplanar CT image reconstructions and MIPs were obtained to evaluate the vascular anatomy. Multidetector CT imaging of the abdomen and pelvis was performed using the standard protocol during bolus administration of intravenous contrast. CONTRAST:  11mL OMNIPAQUE IOHEXOL 350 MG/ML SOLN COMPARISON:  01/14/2019. FINDINGS: CTA CHEST FINDINGS Cardiovascular: There is no acute pulmonary embolism. The main pulmonary artery is significantly dilated measuring approximately 4.9 cm in diameter. There is [name] thoracic aortic aneurysm involving the ascending aorta currently measuring approximately 4.6 cm. There are minimal atherosclerotic changes of the thoracic aorta. There is cardiomegaly. There is no significant pericardial effusion. Coronary artery calcifications are noted. Is [name] well-positioned tunneled dialysis catheter with tip terminating near the cavoatrial junction. There is [name] small amount of adherent thrombus to the midportion of the catheter. Mediastinum/Nodes: --No mediastinal or hilar lymphadenopathy. --No axillary lymphadenopathy. --No supraclavicular lymphadenopathy. --Normal thyroid gland. --The esophagus is unremarkable Lungs/Pleura: There are small to moderate-sized bilateral pleural effusions. There are patchy bilateral ground-glass airspace opacities, greatest at the lung bases bilaterally. There is no pneumothorax. The trachea is unremarkable. Musculoskeletal: No chest wall abnormality. No acute or significant osseous findings. Review of the MIP images confirms the above findings. CT ABDOMEN and PELVIS FINDINGS Hepatobiliary: The liver is normal. Normal gallbladder.There is no biliary ductal dilation. Pancreas: Normal contours without ductal dilatation. No peripancreatic fluid collection. Spleen: No splenic laceration or hematoma. Adrenals/Urinary Tract: --Adrenal glands: No adrenal  hemorrhage. --Right kidney/ureter: There multiple hyperdense right renal cysts that appear grossly stable since prior study. There is no hydronephrosis. --Left kidney/ureter: There hyperdense left renal cyst that appears stable since prior study in 2020. There is no hydronephrosis. --Urinary bladder: The bladder is decompressed which limits evaluation. Stomach/Bowel: --Stomach/Duodenum: No hiatal hernia or other gastric abnormality. Normal duodenal course and caliber. --Small bowel: No dilatation or inflammation. --Colon: No focal abnormality. --Appendix: Normal. Vascular/Lymphatic: Atherosclerotic calcification is present within the non-aneurysmal abdominal aorta, without hemodynamically significant stenosis. There appear to be bilateral occluded lower extremity dialysis grafts. There is [name] small fluid collection surrounding the right femoral vasculature measuring approximately 2.3 cm. This is favored to represent [name] postoperative seroma or hematoma. --No retroperitoneal lymphadenopathy. --No mesenteric lymphadenopathy. --No pelvic or inguinal lymphadenopathy. 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 There is [name] fluid density mass in the right inguinal canal which is stable across prior studies. Other: No ascites or free air. The abdominal wall is normal. Musculoskeletal. There are end-stage degenerative changes of the left hip. There are moderate degenerative changes of the right hip. Review of the MIP images confirms the above findings. IMPRESSION: 1. No acute pulmonary embolism. 2. Significantly dilated main pulmonary artery which can be seen in patients with pulmonary artery hypertension. 3. Small to moderate-sized bilateral pleural effusions with patchy bilateral ground-glass airspace opacities. This may be related to pulmonary edema or an atypical infectious process. 4. Ascending thoracic aortic aneurysm measuring approximately 4.6 cm. Ascending  thoracic aortic aneurysm.  Recommend semi-annual imaging followup by CTA or MRA and referral to cardiothoracic surgery if not already obtained. This recommendation follows 2010 ACCF/AHA/AATS/ACR/ASA/SCA/SCAI/SIR/STS/SVM Guidelines for the Diagnosis and Management of Patients With Thoracic Aortic Disease. Circulation. 2010; 121: W258-N277. Aortic aneurysm NOS (ICD10-I71.9) 5. Bilateral occluded lower extremity dialysis grafts with small fluid collection surrounding the right femoral vasculature. This is favored to represent [name] postoperative seroma or hematoma. 6. Stable bilateral hyperdense renal cysts. 7. 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 Aortic Atherosclerosis (ICD10-I70.0). Electronically Signed   By: Constance Holster M.D.   On: 01/25/2020 02:42   DG Chest Port 1 View  Result Date: 01/24/2020 CLINICAL DATA:  Left-sided chest pain EXAM: PORTABLE CHEST 1 VIEW COMPARISON:  12/24/2019 FINDINGS: Airspace disease symmetrically at the bases with [name] few Kerley lines. No visible effusion. Perma catheter on the right with tip in good position. Cardiomegaly. No pneumothorax IMPRESSION: 1. Symmetric pulmonary opacity favoring edema. 2. More prominent cardiomegaly than on recent comparison. Electronically Signed   By: Monte Fantasia M.D.   On: 01/24/2020 07:43   Medications: . heparin 850 Units/hr (01/25/20 1100)   . allopurinol  100 mg Oral Daily  . amiodarone  200 mg Oral Daily  . bictegravir-emtricitabine-tenofovir AF  1 tablet Oral Daily  . brimonidine  1 drop Left Eye BID  . Chlorhexidine Gluconate Cloth  6 each Topical Daily  . dorzolamide-timolol  1 drop Left Eye BID  . latanoprost  1 drop Left Eye QHS  . loratadine  10 mg Oral Daily  . midodrine  10 mg Oral TID WC  . montelukast  10 mg Oral QHS  . multivitamin  1 tablet Oral QHS  . sodium chloride flush  3 mL Intravenous Q12H  . sucralfate  1 g Oral Once  . sucroferric oxyhydroxide  500 mg Oral TID WC  .  Warfarin - Pharmacist Dosing Inpatient   Does not apply q1800    I have seen and examined this patient and agree with plan and assessment in the above note with renal recommendations/intervention highlighted.  Feels much better but on amiodarone and heparin drip.  Plan for HD again tomorrow. David [name] Remmie Bembenek,MD 01/25/2020 2:25 PM

## 2020-01-26 ENCOUNTER — Inpatient Hospital Stay (HOSPITAL_COMMUNITY): Payer: Medicare HMO

## 2020-01-26 HISTORY — PX: IR THROMBECTOMY AV FISTULA W/THROMBOLYSIS/PTA INC/SHUNT/IMG RIGHT: IMG6119

## 2020-01-26 HISTORY — PX: IR US GUIDE VASC ACCESS RIGHT: IMG2390

## 2020-01-26 LAB — CBC
HCT: 29.8 % — ABNORMAL LOW (ref 39.0–52.0)
Hemoglobin: 9.1 g/dL — ABNORMAL LOW (ref 13.0–17.0)
MCH: 32.6 pg (ref 26.0–34.0)
MCHC: 30.5 g/dL (ref 30.0–36.0)
MCV: 106.8 fL — ABNORMAL HIGH (ref 80.0–100.0)
Platelets: 313 10*3/uL (ref 150–400)
RBC: 2.79 MIL/uL — ABNORMAL LOW (ref 4.22–5.81)
RDW: 19 % — ABNORMAL HIGH (ref 11.5–15.5)
WBC: 7.9 10*3/uL (ref 4.0–10.5)
nRBC: 0.4 % — ABNORMAL HIGH (ref 0.0–0.2)

## 2020-01-26 LAB — BASIC METABOLIC PANEL
Anion gap: 12 (ref 5–15)
BUN: 25 mg/dL — ABNORMAL HIGH (ref 8–23)
CO2: 27 mmol/L (ref 22–32)
Calcium: 9 mg/dL (ref 8.9–10.3)
Chloride: 99 mmol/L (ref 98–111)
Creatinine, Ser: 7.38 mg/dL — ABNORMAL HIGH (ref 0.61–1.24)
GFR calc Af Amer: 7 mL/min — ABNORMAL LOW (ref >60)
GFR calc non Af Amer: 6 mL/min — ABNORMAL LOW (ref >60)
Glucose, Bld: 95 mg/dL (ref 70–99)
Potassium: 4.1 mmol/L (ref 3.5–5.1)
Sodium: 138 mmol/L (ref 135–145)

## 2020-01-26 LAB — HEPARIN LEVEL (UNFRACTIONATED): Heparin Unfractionated: 0.33 IU/mL (ref 0.30–0.70)

## 2020-01-26 LAB — HEPATITIS B SURFACE ANTIBODY,QUALITATIVE: Hep B S Ab: NONREACTIVE

## 2020-01-26 LAB — HEPATITIS B SURFACE ANTIGEN: Hepatitis B Surface Ag: NEGATIVE — AB

## 2020-01-26 LAB — PROTIME-INR
INR: 1.6 — ABNORMAL HIGH (ref 0.8–1.2)
Prothrombin Time: 19 seconds — ABNORMAL HIGH (ref 11.4–15.2)

## 2020-01-26 LAB — HEPATITIS B CORE ANTIBODY, TOTAL: Hep B Core Total Ab: NONREACTIVE

## 2020-01-26 MED ORDER — FENTANYL CITRATE (PF) 100 MCG/2ML IJ SOLN
INTRAMUSCULAR | Status: AC
  Filled 2020-01-26: qty 2

## 2020-01-26 MED ORDER — IOHEXOL 300 MG/ML  SOLN: 100.0000 mL | Freq: Once | INTRAMUSCULAR | Status: DC | PRN

## 2020-01-26 MED ORDER — WARFARIN SODIUM 2.5 MG PO TABS
2.5000 mg | ORAL_TABLET | Freq: Once | ORAL | Status: DC
  Filled 2020-01-26: qty 1

## 2020-01-26 MED ORDER — HEPARIN SODIUM (PORCINE) 1000 UNIT/ML IJ SOLN
INTRAMUSCULAR | Status: AC
  Administered 2020-01-26: 4000 [IU]
  Filled 2020-01-26: qty 4

## 2020-01-26 MED ORDER — MIDODRINE HCL 5 MG PO TABS
ORAL_TABLET | ORAL | Status: AC
  Filled 2020-01-26: qty 2

## 2020-01-26 MED ORDER — MIDAZOLAM HCL 2 MG/2ML IJ SOLN
INTRAMUSCULAR | Status: AC
  Filled 2020-01-26: qty 2

## 2020-01-26 MED ORDER — FENTANYL CITRATE (PF) 100 MCG/2ML IJ SOLN
INTRAMUSCULAR | Status: AC | PRN
  Administered 2020-01-26: 50 ug via INTRAVENOUS

## 2020-01-26 MED ORDER — WARFARIN SODIUM 2.5 MG PO TABS: 2.5000 mg | ORAL_TABLET | Freq: Once | ORAL | Status: DC

## 2020-01-26 MED ORDER — ALTEPLASE 2 MG IJ SOLR
INTRAMUSCULAR | Status: AC | PRN
  Administered 2020-01-26: 3 mg
  Administered 2020-01-26: 1 mg

## 2020-01-26 MED ORDER — LIDOCAINE HCL 1 % IJ SOLN
INTRAMUSCULAR | Status: AC
  Filled 2020-01-26: qty 20

## 2020-01-26 MED ORDER — CHLORHEXIDINE GLUCONATE CLOTH 2 % EX PADS
6.0000 | MEDICATED_PAD | Freq: Every day | CUTANEOUS | Status: DC
  Administered 2020-01-27: 6 via TOPICAL

## 2020-01-26 MED ORDER — MIDAZOLAM HCL 2 MG/2ML IJ SOLN
INTRAMUSCULAR | Status: AC | PRN
  Administered 2020-01-26: 1 mg via INTRAVENOUS

## 2020-01-26 MED ORDER — LIDOCAINE HCL 1 % IJ SOLN
INTRAMUSCULAR | Status: AC | PRN
  Administered 2020-01-26: 5 mL

## 2020-01-26 MED ORDER — ALTEPLASE 2 MG IJ SOLR
INTRAMUSCULAR | Status: AC
  Filled 2020-01-26: qty 8

## 2020-01-26 NOTE — Progress Notes (Signed)
[facility] Progress Note   Subjective:  Seen on HD - 4L UFG and tolerating. Denies overnight dyspnea or CP. Possible for R thigh AVG declot today with IR  Objective Vitals:   01/26/20 0655 01/26/20 0700 01/26/20 0715 01/26/20 0745  BP: 136/75 137/75 139/68 137/79  Pulse: 73 70 64 74  Resp: (!) 22 (!) 22 19 19   Temp: 97.9 F (36.6 C)     TempSrc: Oral     SpO2: 99% 100%    Weight: 78.7 kg     Height:       Physical Exam General: Well appearing man, NAD Heart: RRR; no murmur Lungs: CTA anteriorly Abdomen: soft, non-tender Extremities: No LE edema Dialysis Access:  R thigh AVG - no bruit, TDC in R chest  Additional Objective Labs: Basic Metabolic Panel: Recent Labs  Lab 01/24/20 0712 01/25/20 0600 01/26/20 0338  NA 142 140 138  K 3.6 4.6 4.1  CL 100 101 99  CO2 29 27 27   GLUCOSE 119* 99 95  BUN 20 15 25*  CREATININE 8.57* 5.97* 7.38*  CALCIUM 9.6 9.0 9.0   Liver Function Tests: Recent Labs  Lab 01/24/20 0712 01/25/20 0600  AST 19 16  ALT 16 16  ALKPHOS 149* 129*  BILITOT 0.6 0.5  PROT 6.1* 5.8*  ALBUMIN 2.9* 2.7*   Recent Labs  Lab 01/24/20 0712  LIPASE 33   CBC: Recent Labs  Lab 01/24/20 0712 01/25/20 0600 01/26/20 0338  WBC 7.5 8.0 7.9  NEUTROABS 5.2  --   --   HGB 9.3* 9.3* 9.1*  HCT 30.4* 30.6* 29.8*  MCV 107.0* 108.1* 106.8*  PLT 293 283 313   Studies/Results: CT Angio Chest PE W/Cm &/Or Wo Cm  Result Date: 01/25/2020 CLINICAL DATA:  Left upper quadrant pain. Shortness of breath. EXAM: CT ANGIOGRAPHY CHEST CT ABDOMEN AND PELVIS WITH CONTRAST TECHNIQUE: Multidetector CT imaging of the chest was performed using the standard protocol during bolus administration of intravenous contrast. Multiplanar CT image reconstructions and MIPs were obtained to evaluate the vascular anatomy. Multidetector CT imaging of the abdomen and pelvis was performed using the standard protocol during bolus administration of intravenous contrast.  CONTRAST:  132mL OMNIPAQUE IOHEXOL 350 MG/ML SOLN COMPARISON:  01/14/2019. FINDINGS: CTA CHEST FINDINGS Cardiovascular: There is no acute pulmonary embolism. The main pulmonary artery is significantly dilated measuring approximately 4.9 cm in diameter. There is a thoracic aortic aneurysm involving the ascending aorta currently measuring approximately 4.6 cm. There are minimal atherosclerotic changes of the thoracic aorta. There is cardiomegaly. There is no significant pericardial effusion. Coronary artery calcifications are noted. Is a well-positioned tunneled dialysis catheter with tip terminating near the cavoatrial junction. There is a small amount of adherent thrombus to the midportion of the catheter. Mediastinum/Nodes: --No mediastinal or hilar lymphadenopathy. --No axillary lymphadenopathy. --No supraclavicular lymphadenopathy. --Normal thyroid gland. --The esophagus is unremarkable Lungs/Pleura: There are small to moderate-sized bilateral pleural effusions. There are patchy bilateral ground-glass airspace opacities, greatest at the lung bases bilaterally. There is no pneumothorax. The trachea is unremarkable. Musculoskeletal: No chest wall abnormality. No acute or significant osseous findings. Review of the MIP images confirms the above findings. CT ABDOMEN and PELVIS FINDINGS Hepatobiliary: The liver is normal. Normal gallbladder.There is no biliary ductal dilation. Pancreas: Normal contours without ductal dilatation. No peripancreatic fluid collection. Spleen: No splenic laceration or hematoma. Adrenals/Urinary Tract: --Adrenal glands: No adrenal hemorrhage. --Right kidney/ureter: There multiple hyperdense right renal cysts that appear grossly stable since prior study. There is  no hydronephrosis. --Left kidney/ureter: There hyperdense left renal cyst that appears stable since prior study in 2020. There is no hydronephrosis. --Urinary bladder: The bladder is decompressed which limits evaluation.  Stomach/Bowel: --Stomach/Duodenum: No hiatal hernia or other gastric abnormality. Normal duodenal course and caliber. --Small bowel: No dilatation or inflammation. --Colon: No focal abnormality. --Appendix: Normal. Vascular/Lymphatic: Atherosclerotic calcification is present within the non-aneurysmal abdominal aorta, without hemodynamically significant stenosis. There appear to be bilateral occluded lower extremity dialysis grafts. There is a small fluid collection surrounding the right femoral vasculature measuring approximately 2.3 cm. This is favored to represent a postoperative seroma or hematoma. --No retroperitoneal lymphadenopathy. --No mesenteric lymphadenopathy. --No pelvic or inguinal lymphadenopathy. 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 There is a fluid density mass in the right inguinal canal which is stable across prior studies. Other: No ascites or free air. The abdominal wall is normal. Musculoskeletal. There are end-stage degenerative changes of the left hip. There are moderate degenerative changes of the right hip. Review of the MIP images confirms the above findings. IMPRESSION: 1. No acute pulmonary embolism. 2. Significantly dilated main pulmonary artery which can be seen in patients with pulmonary artery hypertension. 3. Small to moderate-sized bilateral pleural effusions with patchy bilateral ground-glass airspace opacities. This may be related to pulmonary edema or an atypical infectious process. 4. Ascending thoracic aortic aneurysm measuring approximately 4.6 cm. Ascending thoracic aortic aneurysm. Recommend semi-annual imaging followup by CTA or MRA and referral to cardiothoracic surgery if not already obtained. This recommendation follows 2010 ACCF/AHA/AATS/ACR/ASA/SCA/SCAI/SIR/STS/SVM Guidelines for the Diagnosis and Management of Patients With Thoracic Aortic Disease. Circulation. 2010; 121: W098-J191. Aortic aneurysm NOS (ICD10-I71.9) 5. Bilateral  occluded lower extremity dialysis grafts with small fluid collection surrounding the right femoral vasculature. This is favored to represent a postoperative seroma or hematoma. 6. Stable bilateral hyperdense renal cysts. 7. 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 Aortic Atherosclerosis (ICD10-I70.0). Electronically Signed   By: Constance Holster M.D.   On: 01/25/2020 02:42   CT Abdomen Pelvis W Contrast  Result Date: 01/25/2020 CLINICAL DATA:  Left upper quadrant pain. Shortness of breath. EXAM: CT ANGIOGRAPHY CHEST CT ABDOMEN AND PELVIS WITH CONTRAST TECHNIQUE: Multidetector CT imaging of the chest was performed using the standard protocol during bolus administration of intravenous contrast. Multiplanar CT image reconstructions and MIPs were obtained to evaluate the vascular anatomy. Multidetector CT imaging of the abdomen and pelvis was performed using the standard protocol during bolus administration of intravenous contrast. CONTRAST:  180mL OMNIPAQUE IOHEXOL 350 MG/ML SOLN COMPARISON:  01/14/2019. FINDINGS: CTA CHEST FINDINGS Cardiovascular: There is no acute pulmonary embolism. The main pulmonary artery is significantly dilated measuring approximately 4.9 cm in diameter. There is a thoracic aortic aneurysm involving the ascending aorta currently measuring approximately 4.6 cm. There are minimal atherosclerotic changes of the thoracic aorta. There is cardiomegaly. There is no significant pericardial effusion. Coronary artery calcifications are noted. Is a well-positioned tunneled dialysis catheter with tip terminating near the cavoatrial junction. There is a small amount of adherent thrombus to the midportion of the catheter. Mediastinum/Nodes: --No mediastinal or hilar lymphadenopathy. --No axillary lymphadenopathy. --No supraclavicular lymphadenopathy. --Normal thyroid gland. --The esophagus is unremarkable Lungs/Pleura: There are small to  moderate-sized bilateral pleural effusions. There are patchy bilateral ground-glass airspace opacities, greatest at the lung bases bilaterally. There is no pneumothorax. The trachea is unremarkable. Musculoskeletal: No chest wall abnormality. No acute or significant osseous findings. Review of the MIP images  confirms the above findings. CT ABDOMEN and PELVIS FINDINGS Hepatobiliary: The liver is normal. Normal gallbladder.There is no biliary ductal dilation. Pancreas: Normal contours without ductal dilatation. No peripancreatic fluid collection. Spleen: No splenic laceration or hematoma. Adrenals/Urinary Tract: --Adrenal glands: No adrenal hemorrhage. --Right kidney/ureter: There multiple hyperdense right renal cysts that appear grossly stable since prior study. There is no hydronephrosis. --Left kidney/ureter: There hyperdense left renal cyst that appears stable since prior study in 2020. There is no hydronephrosis. --Urinary bladder: The bladder is decompressed which limits evaluation. Stomach/Bowel: --Stomach/Duodenum: No hiatal hernia or other gastric abnormality. Normal duodenal course and caliber. --Small bowel: No dilatation or inflammation. --Colon: No focal abnormality. --Appendix: Normal. Vascular/Lymphatic: Atherosclerotic calcification is present within the non-aneurysmal abdominal aorta, without hemodynamically significant stenosis. There appear to be bilateral occluded lower extremity dialysis grafts. There is a small fluid collection surrounding the right femoral vasculature measuring approximately 2.3 cm. This is favored to represent a postoperative seroma or hematoma. --No retroperitoneal lymphadenopathy. --No mesenteric lymphadenopathy. --No pelvic or inguinal lymphadenopathy. 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 There is a fluid density mass in the right inguinal canal which is stable across prior studies. Other: No ascites or free air. The abdominal wall is  normal. Musculoskeletal. There are end-stage degenerative changes of the left hip. There are moderate degenerative changes of the right hip. Review of the MIP images confirms the above findings. IMPRESSION: 1. No acute pulmonary embolism. 2. Significantly dilated main pulmonary artery which can be seen in patients with pulmonary artery hypertension. 3. Small to moderate-sized bilateral pleural effusions with patchy bilateral ground-glass airspace opacities. This may be related to pulmonary edema or an atypical infectious process. 4. Ascending thoracic aortic aneurysm measuring approximately 4.6 cm. Ascending thoracic aortic aneurysm. Recommend semi-annual imaging followup by CTA or MRA and referral to cardiothoracic surgery if not already obtained. This recommendation follows 2010 ACCF/AHA/AATS/ACR/ASA/SCA/SCAI/SIR/STS/SVM Guidelines for the Diagnosis and Management of Patients With Thoracic Aortic Disease. Circulation. 2010; 121: Z610-R604. Aortic aneurysm NOS (ICD10-I71.9) 5. Bilateral occluded lower extremity dialysis grafts with small fluid collection surrounding the right femoral vasculature. This is favored to represent a postoperative seroma or hematoma. 6. Stable bilateral hyperdense renal cysts. 7. 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 Aortic Atherosclerosis (ICD10-I70.0). Electronically Signed   By: Constance Holster M.D.   On: 01/25/2020 02:42   Medications: . heparin 950 Units/hr (01/25/20 1800)   . allopurinol  100 mg Oral Daily  . amiodarone  200 mg Oral Daily  . bictegravir-emtricitabine-tenofovir AF  1 tablet Oral Daily  . brimonidine  1 drop Left Eye BID  . Chlorhexidine Gluconate Cloth  6 each Topical Daily  . dorzolamide-timolol  1 drop Left Eye BID  . heparin      . latanoprost  1 drop Left Eye QHS  . loratadine  10 mg Oral Daily  . midodrine  10 mg Oral TID WC  . montelukast  10 mg Oral QHS  . multivitamin  1 tablet  Oral QHS  . sodium chloride flush  3 mL Intravenous Q12H  . sucralfate  1 g Oral Once  . sucroferric oxyhydroxide  500 mg Oral TID WC  . warfarin  2.5 mg Oral ONCE-1800  . Warfarin - Pharmacist Dosing Inpatient   Does not apply q1800    Dialysis Orders: [facility] at [place], EDW 79.5kg, 2K/2Ca, TDC + R fem AVG, heparin 2000 - Calcitriol 0.58mcg PO q HD -  Mircera 119mcg IV q 2 weeks (last 2/13)  Assessment/Plan: 1. Volume overload/Pulmonary edema: BiPAP on admit - improved with HD, will continue to challenge EDW down with dialysis. 2. 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 3. ESRD: Continue HD per usual [facility] schedule - HD today - 4L UFG and tolerating. 4. Chest Pain: On admit - felt d/t #1. 5. Hypotension/volume: On midodrine TIW. 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 7. Metabolic bone disease: CorrCa a little high, calcitriol on hold. Phos pending. 8. Clotted R fem AVG: Using [facility] for HD - IR consulted. 9. Nutrition: Alb low, add pro-stat supplements. 10. 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 Hx CVA 12. Hx ascending thoracic aortic aneurysm  Veneta Penton, PA-C 01/26/2020, 8:22 AM  [facility] Pager: [phone]

## 2020-01-26 NOTE — Consult Note (Signed)
Chief Complaint: Patient was seen in consultation today for declot right thigh dialysis graft Chief Complaint  Patient presents with  . Chest Pain   at the request of Dr Mickel Crow   Supervising Physician: Corrie Mckusick  Patient Status: [facility]  History of Present Illness: [place] is a 79 y.o. male   ESRD Raid A fib- on amnio/coumadin Last dose coumadin last pm (INR 1.6 this am) Now on hep drip  Clotted right thigh dialysis graft Last use last week Placed 11/2019- Dr Oneida Alar Started use only ~2 weeks ago Clotted maybe late last week per pt  Request for right thigh graft declot Pt has R IJ dialysis catheter In use now-- in dialysis now  Past Medical History:  Diagnosis Date  . 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 Acute on chronic systolic and diastolic heart failure, NYHA class 4 ([place])   . Anemia, iron deficiency 11/15/2011  . Arthritis    "hands, right knee, feet" (02/21/2018)  . Atrial fibrillation ([place])   . Cancer [facility])    hx of prostate; s/p radioactive seed implant 10/2009 Dr Janice Norrie  . Cardiac arrest ([place]) 02/17/2018  . CHF (congestive heart failure) ([place])   . Chronic combined systolic and diastolic heart failure, NYHA class 3 ([place]) 06/2010   felt to be secondary to hypertensive cardiomyopathy  . Chronic lower back pain   . CKD (chronic kidney disease) stage V requiring chronic dialysis ([place])   . ESRD on dialysis [facility])    started 02/2018 [place]  . GERD (gastroesophageal reflux disease)   . Glaucoma    "I had surgery and dont have it any more"  . Gout    daily RX (02/21/2018)  . Heart murmur    "mild" per pt  . Hepatitis    years ago  . HIV (human immunodeficiency virus infection) ([place])   . HIV infection ([place])   . Hyperlipidemia   . Hypertension    followed by [place] [facility] (Dr Dani Gobble Croitoru)  . Hypertension   . Nonischemic cardiomyopathy ([place])   . Pneumonia 11/2017  . Prostate  cancer ([place])   . Sinus bradycardia   . Sleep apnea    does not use a cpap  . Stroke [facility])    "mini stroke" years ago  . Wears glasses     Past Surgical History:  Procedure Laterality Date  . AV FISTULA PLACEMENT Right 10/13/2016   Procedure: ARTERIOVENOUS (AV) FISTULA CREATION;  Surgeon: [name], MD;  Location: [place];  Service: Vascular;  Laterality: Right;  . AV FISTULA PLACEMENT Left 02/25/2018   Procedure: INSERTION OF ARTERIOVENOUS (AV) GORE-TEX GRAFT LEFT UPPER ARM;  Surgeon: Angelia Mould, MD;  Location: [place];  Service: Vascular;  Laterality: Left;  . AV FISTULA PLACEMENT Right 08/07/2018   Procedure: Creation of right arm brachiocephalic Fistula;  Surgeon: Waynetta Sandy, MD;  Location: [place];  Service: Vascular;  Laterality: Right;  . AV FISTULA PLACEMENT Left 11/10/2019   Procedure: INSERTION OF ARTERIOVENOUS (AV) GORE-TEX GRAFT THIGH;  Surgeon: Elam Dutch, MD;  Location: [place];  Service: Vascular;  Laterality: Left;  . AV FISTULA PLACEMENT Right 12/02/2019   Procedure: INSERTION OF ARTERIOVENOUS (AV) GORE-TEX GRAFT RIGHT  THIGH;  Surgeon: Waynetta Sandy, MD;  Location: [place];  Service: Vascular;  Laterality: Right;  . BASCILIC VEIN TRANSPOSITION Left 06/24/2015   Procedure: BASILIC VEIN TRANSPOSITION;  Surgeon: Mal Misty, MD;  Location[name]  OR;  Service: Vascular;  Laterality: Left;  . BIOPSY  03/14/2019   Procedure: BIOPSY;  Surgeon: Irene Shipper, MD;  Location: [place];  Service: Endoscopy;;  . CATARACT EXTRACTION, BILATERAL Bilateral   . COLONOSCOPY WITH PROPOFOL N/A 03/14/2019   Procedure: COLONOSCOPY WITH PROPOFOL;  Surgeon: Irene Shipper, MD;  Location: [facility] ENDOSCOPY;  Service: Endoscopy;  Laterality: N/A;  . ESOPHAGOGASTRODUODENOSCOPY (EGD) WITH PROPOFOL N/A 05/05/2014   Procedure: ESOPHAGOGASTRODUODENOSCOPY (EGD) WITH PROPOFOL;  Surgeon: Missy Sabins, MD;  Location: [facility] ENDOSCOPY;  Service: Endoscopy;  Laterality: N/A;  . EYE  SURGERY Bilateral    cataract surgery   . GLAUCOMA SURGERY Bilateral   . INSERTION OF ARTERIOVENOUS (AV) ARTEGRAFT ARM  10/09/2018   Procedure: INSERTION OF ARTERIOVENOUS (AV) 35mm x 41cm ARTEGRAFT LEFT UPPER ARM;  Surgeon: Waynetta Sandy, MD;  Location: [place];  Service: Vascular;;  . INSERTION OF DIALYSIS CATHETER Right 07/14/2019   Procedure: Insertion Of Dialysis Catheter;  Surgeon: Elam Dutch, MD;  Location: [facility] OR;  Service: Vascular;  Laterality: Right;  placed right Internal Jugular  . INSERTION PROSTATE RADIATION SEED    . IR FLUORO GUIDE CV LINE RIGHT  02/18/2018  . IR REMOVAL TUN CV CATH W/O FL  12/06/2018  . IR US GUIDE VASC ACCESS RIGHT  02/18/2018  . LEFT HEART CATH AND CORONARY ANGIOGRAPHY N/A 02/20/2018   Procedure: LEFT HEART CATH AND CORONARY ANGIOGRAPHY;  Surgeon: Jettie Booze, MD;  Location: [place] CV LAB;;; 50% ostial OM2, 25% mLAD.  Marland Kitchen LEFT HEART CATH AND CORONARY ANGIOGRAPHY  2004   mildly depressed LV systolic fx EF 62%,I[facility]NLG coronaries/abdominal aorta/renal arteries.  . LOWER EXTREMITY ANGIOGRAM Left 11/17/2019   Procedure: Lower Extremity Fistulogram;  Surgeon: Marty Heck, MD;  Location: [place];  Service: Vascular;  Laterality: Left;  . NM [place]  02/21/2010   No ischemia or infarction.  EF 27%.  Marland Kitchen RADIOACTIVE SEED IMPLANT  2010   prostate cancer  . REVISION OF ARTERIOVENOUS GORETEX GRAFT Right 07/11/2019   Procedure: REVISION OF ARTERIOVENOUS GORETEX GRAFT RIGHT ARM WITH ARTEGRAFT;  Surgeon: Serafina Mitchell, MD;  Location: [place];  Service: Vascular;  Laterality: Right;  . SHUNTOGRAM Right 07/14/2019   Procedure: Shuntogram;  Surgeon: Elam Dutch, MD;  Location: [place];  Service: Vascular;  Laterality: Right;  . THROMBECTOMY AND REVISION OF ARTERIOVENTOUS (AV) GORETEX  GRAFT Right 07/14/2019   Procedure: THROMBECTOMY AND REVISION OF ARTERIOVENTOUS (AV) GORETEX  GRAFT;  Surgeon: Elam Dutch, MD;  Location: [place];  Service: Vascular;  Laterality: Right;  . THROMBECTOMY W/ EMBOLECTOMY Left 02/26/2018   Procedure: THROMBECTOMY ARTERIOVENOUS GRAFT;  Surgeon: Angelia Mould, MD;  Location: [place];  Service: Vascular;  Laterality: Left;  . THROMBECTOMY W/ EMBOLECTOMY Left 11/17/2019   Procedure: THROMBECTOMY ARTERIOVENOUS GRAFT LEFT THIGH;  Surgeon: Marty Heck, MD;  Location: [place];  Service: Vascular;  Laterality: Left;  . TRANSTHORACIC ECHOCARDIOGRAM  02/2012   [facility]) EF 45-50% with mild global HK.  Mild LVH,LA mod. dilated,mild-mod. 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 AI.  Marland Kitchen TRANSTHORACIC ECHOCARDIOGRAM  9/'18; 3/'19   a) Severely reduced EF.  25 from 30%.  GR 1 DD with diffuse ecchymosis. Mild AS & AI.  Mild LA/RA dilation; b) (post PEA arest):   Moderately dilated LV with moderate concentric virtually.  EF 15 to 20%.  GR 1 DD.  Paradoxical septal motion. Mild AI.  Mild LA  dilation.  Poorly visualized RV.  Marland Kitchen TRANSTHORACIC ECHOCARDIOGRAM  02/2019   EF 20-25%.  Unable to assess diastolic function (A. Fib).  No LV apical thrombus.  Mild AI.  Mildly reduced RV function, however poorly visualized.  Marland Kitchen ULTRASOUND GUIDANCE FOR VASCULAR ACCESS  02/20/2018   Procedure: Ultrasound Guidance For Vascular Access;  Surgeon: Jettie Booze, MD;  Location: [place] CV LAB;  Service: Cardiovascular;;  . UPPER EXTREMITY VENOGRAPHY N/A 07/08/2018   Procedure: UPPER EXTREMITY VENOGRAPHY;  Surgeon: Waynetta Sandy, MD;  Location: [place] CV LAB;  Service: Cardiovascular;  Laterality: N/A;  Bilateral    Allergies: Dextromethorphan-guaifenesin, Tocotrienols, and Losartan potassium  Medications: Prior to Admission medications   Medication Sig Start Date End Date Taking? Authorizing Provider  acetaminophen (TYLENOL) 500 MG tablet Take 1,000 mg by mouth every 6 (six) hours as needed for moderate pain.   Yes [provider]  allopurinol (ZYLOPRIM) 100  MG tablet 1 tablet by mouth 3x weekly after dialysis. Patient taking differently: Take 100 mg by mouth See admin instructions. Take one tablet (100 mg) by mouth Tuesday, Thursday, Saturday after dialysis 09/15/19  Yes [name], NP  amiodarone (PACERONE) 200 MG tablet Take 1 tablet (200 mg total) by mouth daily. Patient taking differently: Take 200 mg by mouth daily at 12 noon.  05/12/19  Yes Croitoru, Mihai, MD  bictegravir-emtricitabine-tenofovir AF ([name]) 50-200-25 MG TABS tablet Take 1 tablet by mouth daily. Patient taking differently: Take 1 tablet by mouth daily at 12 noon.  09/29/19  Yes Tommy Medal, Lavell Islam, MD  brimonidine (ALPHAGAN) 0.15 % ophthalmic solution Place 1 drop into the left eye 2 (two) times daily. 11/15/17  Yes [provider]  dorzolamide-timolol (COSOPT) 22.3-6.8 MG/ML ophthalmic solution Place 1 drop into the left eye 2 (two) times daily. 12/17/17  Yes [provider]  latanoprost (XALATAN) 0.005 % ophthalmic solution Place 1 drop into the left eye at bedtime.    Yes [provider]  loratadine (CLARITIN) 10 MG tablet Take 10 mg by mouth daily at 12 noon.    Yes [provider]  midodrine (PROAMATINE) 10 MG tablet Take 1 tablet (10 mg total) by mouth as directed. Twice a day on the day of HD Tuesday, Thursday, Saturday (1 in AM and 1 at [place]). Take 1/2 tablet (5mg ) a day on non HD days Patient taking differently: Take 10 mg by mouth See admin instructions. Take one tablet (10 mg) by mouth twice daily - 6am and 12pm -  on dialysis days - Tuesday, Thursday, Saturday 04/22/19  Yes Leonie Man, MD  midodrine (PROAMATINE) 5 MG tablet Take 5 mg by mouth See admin instructions. Take one tablet (5 mg) by mouth at 9 am on non-dialysis days - Sunday, Monday, Wednesday, Friday   Yes [provider]  montelukast (SINGULAIR) 10 MG tablet Take 1 tablet (10 mg total) by mouth at bedtime. 09/12/19  Yes [name], NP    Multiple Vitamin (MULTIVITAMIN WITH MINERALS) TABS tablet Take 1 tablet by mouth daily at 2 PM.   Yes [provider]  polyethylene glycol (MIRALAX / GLYCOLAX) 17 g packet Take 17 g by mouth every evening.   Yes [provider]  sucroferric oxyhydroxide (VELPHORO) 500 MG chewable tablet Chew 500 mg by mouth 3 (three) times daily with meals.   Yes [provider]  traMADol (ULTRAM) 50 MG tablet Take 1 tablet (50 mg total) by mouth every 6 (six) hours as needed. Patient taking  differently: Take 50 mg by mouth every 6 (six) hours as needed (pain).  01/09/20  Yes Thurnell Lose, MD  warfarin (COUMADIN) 3 MG tablet Take 3 mg by mouth daily at 6 PM.   Yes [provider]  warfarin (COUMADIN) 5 MG tablet TAKE 1/2 TO 1 TABLET BY MOUTH DAILY AS DIRECTED BY COUMADIN CLINIC Patient not taking: Take 1/2 to 1 tablet by mouth daily as directed by coumadin clinic; 2.5 mg on Mon and Fri, 5 ,g all other days 11/04/19   Leonie Man, MD     Family History  Problem Relation Age of Onset  . Hypertension Mother   . Thyroid disease Mother   . Cholelithiasis Daughter   . Cholelithiasis Son   . Hypertension Maternal Grandmother   . Diabetes Maternal Grandmother   . Heart attack Neg Hx   . Hyperlipidemia Neg Hx     Social History   Socioeconomic History  . Marital status: Married    Spouse name: Not on file  . Number of children: 2  . Years of education: 62  . Highest education level: Not on file  Occupational History  . 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 Smoking status: Former Smoker    Packs/day: 1.00    Years: 30.00    Pack years: 30.00    Types: Cigarettes    Quit date: 2006    Years since quitting: 15.1  . Smokeless tobacco: Never Used  Substance and Sexual Activity  . Alcohol use: Yes    Alcohol/week: 1.0 standard drinks    Types: 1 Shots of liquor per week    Comment: occasional  . Drug use: Never  .  Sexual activity: Not Currently    Comment: declined condoms 09/2019  Other Topics Concern  . Not on file  Social History Narrative   ** Merged History Encounter **       Stays active at home Regular exercise: no Drinks 2 cups of coffee a week, 1 mountain dew soda a day.   Social Determinants of Health   Financial Resource Strain:   . Difficulty of Paying Living Expenses: Not on file  Food Insecurity: No Food Insecurity  . Worried About Charity fundraiser in the Last Year: Never true  . Ran Out of Food in the Last Year: Never true  Transportation Needs: No Transportation Needs  . Lack of Transportation (Medical): No  . Lack of Transportation (Non-Medical): No  Physical Activity:   . Days of Exercise per Week: Not on file  . Minutes of Exercise per Session: Not on file  Stress:   . Feeling of Stress : Not on file  Social Connections:   . Frequency of Communication with Friends and Family: Not on file  . Frequency of Social Gatherings with Friends and Family: Not on file  . Attends Religious Services: Not on file  . Active Member of Clubs or Organizations: Not on file  . Attends Archivist Meetings: Not on file  . Marital Status: Not on file     Review of Systems: A 12 point ROS discussed and pertinent positives are indicated in the HPI above.  All other systems are negative.  Review of Systems  Constitutional: Positive for fatigue. Negative for activity change and fever.  Respiratory: Negative for cough and shortness of breath.   Cardiovascular: Negative for chest pain.  Gastrointestinal: Negative for abdominal pain.  Neurological: Positive for  weakness.  Psychiatric/Behavioral: Negative for behavioral problems and confusion.    Vital Signs: BP (!) 125/56 (BP Location: Left Arm)   Pulse 74   Temp 97.9 F (36.6 C) (Oral)   Resp 18   Ht 6\' 2"  (1.88 m)   Wt 173 lb 8 oz (78.7 kg)   SpO2 100%   BMI 22.28 kg/m   Physical Exam Vitals reviewed.    Cardiovascular:     Rate and Rhythm: Regular rhythm. Tachycardia present.  Pulmonary:     Effort: Pulmonary effort is normal.  Abdominal:     Palpations: Abdomen is soft.  Musculoskeletal:     Comments: Right thigh graft no pulse and no thrill  Skin:    General: Skin is warm and dry.  Neurological:     Mental Status: He is alert and oriented to person, place, and time.  Psychiatric:        Behavior: Behavior normal.     Imaging: CT Angio Chest PE W/Cm &/Or Wo Cm  Result Date: 01/25/2020 CLINICAL DATA:  Left upper quadrant pain. Shortness of breath. EXAM: CT ANGIOGRAPHY CHEST CT ABDOMEN AND PELVIS WITH CONTRAST TECHNIQUE: Multidetector CT imaging of the chest was performed using the standard protocol during bolus administration of intravenous contrast. Multiplanar CT image reconstructions and MIPs were obtained to evaluate the vascular anatomy. Multidetector CT imaging of the abdomen and pelvis was performed using the standard protocol during bolus administration of intravenous contrast. CONTRAST:  147mL OMNIPAQUE IOHEXOL 350 MG/ML SOLN COMPARISON:  01/14/2019. FINDINGS: CTA CHEST FINDINGS Cardiovascular: There is no acute pulmonary embolism. The main pulmonary artery is significantly dilated measuring approximately 4.9 cm in diameter. There is a thoracic aortic aneurysm involving the ascending aorta currently measuring approximately 4.6 cm. There are minimal atherosclerotic changes of the thoracic aorta. There is cardiomegaly. There is no significant pericardial effusion. Coronary artery calcifications are noted. Is a well-positioned tunneled dialysis catheter with tip terminating near the cavoatrial junction. There is a small amount of adherent thrombus to the midportion of the catheter. Mediastinum/Nodes: --No mediastinal or hilar lymphadenopathy. --No axillary lymphadenopathy. --No supraclavicular lymphadenopathy. --Normal thyroid gland. --The esophagus is unremarkable Lungs/Pleura: There  are small to moderate-sized bilateral pleural effusions. There are patchy bilateral ground-glass airspace opacities, greatest at the lung bases bilaterally. There is no pneumothorax. The trachea is unremarkable. Musculoskeletal: No chest wall abnormality. No acute or significant osseous findings. Review of the MIP images confirms the above findings. CT ABDOMEN and PELVIS FINDINGS Hepatobiliary: The liver is normal. Normal gallbladder.There is no biliary ductal dilation. Pancreas: Normal contours without ductal dilatation. No peripancreatic fluid collection. Spleen: No splenic laceration or hematoma. Adrenals/Urinary Tract: --Adrenal glands: No adrenal hemorrhage. --Right kidney/ureter: There multiple hyperdense right renal cysts that appear grossly stable since prior study. There is no hydronephrosis. --Left kidney/ureter: There hyperdense left renal cyst that appears stable since prior study in 2020. There is no hydronephrosis. --Urinary bladder: The bladder is decompressed which limits evaluation. Stomach/Bowel: --Stomach/Duodenum: No hiatal hernia or other gastric abnormality. Normal duodenal course and caliber. --Small bowel: No dilatation or inflammation. --Colon: No focal abnormality. --Appendix: Normal. Vascular/Lymphatic: Atherosclerotic calcification is present within the non-aneurysmal abdominal aorta, without hemodynamically significant stenosis. There appear to be bilateral occluded lower extremity dialysis grafts. There is a small fluid collection surrounding the right femoral vasculature measuring approximately 2.3 cm. This is favored to represent a postoperative seroma or hematoma. --No retroperitoneal lymphadenopathy. --No mesenteric lymphadenopathy. --No pelvic or inguinal lymphadenopathy. Reproductive: Multiple brachytherapy beads are noted in the  prostate gland. There is a fluid density mass in the right inguinal canal which is stable across prior studies. Other: No ascites or free air. The  abdominal wall is normal. Musculoskeletal. There are end-stage degenerative changes of the left hip. There are moderate degenerative changes of the right hip. Review of the MIP images confirms the above findings. IMPRESSION: 1. No acute pulmonary embolism. 2. Significantly dilated main pulmonary artery which can be seen in patients with pulmonary artery hypertension. 3. Small to moderate-sized bilateral pleural effusions with patchy bilateral ground-glass airspace opacities. This may be related to pulmonary edema or an atypical infectious process. 4. Ascending thoracic aortic aneurysm measuring approximately 4.6 cm. Ascending thoracic aortic aneurysm. Recommend semi-annual imaging followup by CTA or MRA and referral to cardiothoracic surgery if not already obtained. This recommendation follows 2010 ACCF/AHA/AATS/ACR/ASA/SCA/SCAI/SIR/STS/SVM Guidelines for the Diagnosis and Management of Patients With Thoracic Aortic Disease. Circulation. 2010; 121: N053-Z767. Aortic aneurysm NOS (ICD10-I71.9) 5. Bilateral occluded lower extremity dialysis grafts with small fluid collection surrounding the right femoral vasculature. This is favored to represent a postoperative seroma or hematoma. 6. Stable bilateral hyperdense renal cysts. 7. 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 Aortic Atherosclerosis (ICD10-I70.0). Electronically Signed   By: Constance Holster M.D.   On: 01/25/2020 02:42   MR BRAIN WO CONTRAST  Result Date: 01/08/2020 CLINICAL DATA:  79 year old male with left extremity weakness, generalized deconditioning. EXAM: MRI HEAD WITHOUT CONTRAST TECHNIQUE: Multiplanar, multiecho pulse sequences of the brain and surrounding structures were obtained without intravenous contrast. COMPARISON:  Brain MRI 11/11/2017.  Head CT 09/05/2019. FINDINGS: Brain: No restricted diffusion or evidence of acute infarction. Tiny chronic lacunar infarcts in the right cerebellum  are stable. Mild for age T2 heterogeneity in the pons and more moderate for age bilateral cerebral white matter T2 and FLAIR hyperintensity. There is a small area of chronic cortical encephalomalacia in the left superior parietal lobe including some involvement of the sensory strip (series 11, image 20). Stable cerebral volume since 2018. Mild nonspecific ventriculomegaly, stable, and the temporal horns remain relatively diminutive. No midline shift, mass effect, evidence of mass lesion, extra-axial collection or acute intracranial hemorrhage. Cervicomedullary junction and pituitary are within normal limits. The deep gray matter nuclei remain within normal limits. No new signal changes identified since 2018. Vascular: Major intracranial vascular flow voids are stable since 2018. Skull and upper cervical spine: Negative for age visible cervical spine. Visualized bone marrow signal is within normal limits. Sinuses/Orbits: Mucosal thickening stable and negative orbits. Mild paranasal is stable sinus. Other: Mastoid air cells remain clear. Visible internal auditory structures appear normal. IMPRESSION: 1. No acute intracranial abnormality. Stable MRI appearance of the brain since 2018. 2. Small chronic posterior left MCA territory infarct, and evidence of chronic small vessel disease in the cerebral white matter and cerebellum. 3. Mild ventriculomegaly is chronic and probably ex vacuo in nature. Electronically Signed   By: Genevie Ann M.D.   On: 01/08/2020 09:08   MR LUMBAR SPINE WO CONTRAST  Result Date: 01/08/2020 CLINICAL DATA:  Lumbar radiculopathy. EXAM: MRI LUMBAR SPINE WITHOUT CONTRAST TECHNIQUE: Multiplanar, multisequence MR imaging of the lumbar spine was performed. No intravenous contrast was administered. COMPARISON:  Correlation made with CT of the abdomen performed March 15, 2019 and Apr 30, 1999. FINDINGS: Segmentation:  Standard. Alignment:  Physiologic. Vertebrae: Radiation induced fatty marrow  replacement is noted in the sacrum and L5. Focal lesions in the L4 and L5 vertebral bodies with  predominantly low signal on T1 and T2 and increased signal on STIR correspond to a sclerotic lesion seen on prior CT. Conus medullaris and cauda equina: Conus extends to the L1 level. Conus and cauda equina appear normal. Paraspinal and other soft tissues: Negative. Disc levels: L1-2: Mild facet degenerative changes without spinal canal or neural foraminal stenosis. L2-3: Small symmetric disc bulge, facet degenerative changes and ligamentum flavum hypertrophy resulting in mild left neural foraminal stenosis. No significant neural. L3-4: Disc bulge with superimposed central disc protrusion, facet degenerative changes flavum resulting in mild neural. No significant spinal stenosis. L4-5: Disc bulge, facet degenerative resulting mild bilateral neural foraminal narrowing and mild spinal stenosis with obliteration of the subarticular zones. L5-S1: Facet degenerative changes without significant spinal canal or neural stenosis. IMPRESSION: 1. Mild degenerative changes of lumbar spine pronounced at L4-5 where there is mild spinal canal stenosis with obliteration the subarticular zones and mild bilateral neural foraminal stenosis. 2. Focal lesions in the L4 and L5 vertebral bodies appear stable in size since 2015. Electronically Signed   By: Pedro Earls M.D.   On: 01/08/2020 09:59   CT Abdomen Pelvis W Contrast  Result Date: 01/25/2020 CLINICAL DATA:  Left upper quadrant pain. Shortness of breath. EXAM: CT ANGIOGRAPHY CHEST CT ABDOMEN AND PELVIS WITH CONTRAST TECHNIQUE: Multidetector CT imaging of the chest was performed using the standard protocol during bolus administration of intravenous contrast. Multiplanar CT image reconstructions and MIPs were obtained to evaluate the vascular anatomy. Multidetector CT imaging of the abdomen and pelvis was performed using the standard protocol during bolus  administration of intravenous contrast. CONTRAST:  183mL OMNIPAQUE IOHEXOL 350 MG/ML SOLN COMPARISON:  01/14/2019. FINDINGS: CTA CHEST FINDINGS Cardiovascular: There is no acute pulmonary embolism. The main pulmonary artery is significantly dilated measuring approximately 4.9 cm in diameter. There is a thoracic aortic aneurysm involving the ascending aorta currently measuring approximately 4.6 cm. There are minimal atherosclerotic changes of the thoracic aorta. There is cardiomegaly. There is no significant pericardial effusion. Coronary artery calcifications are noted. Is a well-positioned tunneled dialysis catheter with tip terminating near the cavoatrial junction. There is a small amount of adherent thrombus to the midportion of the catheter. Mediastinum/Nodes: --No mediastinal or hilar lymphadenopathy. --No axillary lymphadenopathy. --No supraclavicular lymphadenopathy. --Normal thyroid gland. --The esophagus is unremarkable Lungs/Pleura: There are small to moderate-sized bilateral pleural effusions. There are patchy bilateral ground-glass airspace opacities, greatest at the lung bases bilaterally. There is no pneumothorax. The trachea is unremarkable. Musculoskeletal: No chest wall abnormality. No acute or significant osseous findings. Review of the MIP images confirms the above findings. CT ABDOMEN and PELVIS FINDINGS Hepatobiliary: The liver is normal. Normal gallbladder.There is no biliary ductal dilation. Pancreas: Normal contours without ductal dilatation. No peripancreatic fluid collection. Spleen: No splenic laceration or hematoma. Adrenals/Urinary Tract: --Adrenal glands: No adrenal hemorrhage. --Right kidney/ureter: There multiple hyperdense right renal cysts that appear grossly stable since prior study. There is no hydronephrosis. --Left kidney/ureter: There hyperdense left renal cyst that appears stable since prior study in 2020. There is no hydronephrosis. --Urinary bladder: The bladder is  decompressed which limits evaluation. Stomach/Bowel: --Stomach/Duodenum: No hiatal hernia or other gastric abnormality. Normal duodenal course and caliber. --Small bowel: No dilatation or inflammation. --Colon: No focal abnormality. --Appendix: Normal. Vascular/Lymphatic: Atherosclerotic calcification is present within the non-aneurysmal abdominal aorta, without hemodynamically significant stenosis. There appear to be bilateral occluded lower extremity dialysis grafts. There is a small fluid collection surrounding the right femoral vasculature measuring approximately 2.3 cm. This  is favored to represent a postoperative seroma or hematoma. --No retroperitoneal lymphadenopathy. --No mesenteric lymphadenopathy. --No pelvic or inguinal lymphadenopathy. 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 There is a fluid density mass in the right inguinal canal which is stable across prior studies. Other: No ascites or free air. The abdominal wall is normal. Musculoskeletal. There are end-stage degenerative changes of the left hip. There are moderate degenerative changes of the right hip. Review of the MIP images confirms the above findings. IMPRESSION: 1. No acute pulmonary embolism. 2. Significantly dilated main pulmonary artery which can be seen in patients with pulmonary artery hypertension. 3. Small to moderate-sized bilateral pleural effusions with patchy bilateral ground-glass airspace opacities. This may be related to pulmonary edema or an atypical infectious process. 4. Ascending thoracic aortic aneurysm measuring approximately 4.6 cm. Ascending thoracic aortic aneurysm. Recommend semi-annual imaging followup by CTA or MRA and referral to cardiothoracic surgery if not already obtained. This recommendation follows 2010 ACCF/AHA/AATS/ACR/ASA/SCA/SCAI/SIR/STS/SVM Guidelines for the Diagnosis and Management of Patients With Thoracic Aortic Disease. Circulation. 2010; 121: D983-J825. Aortic  aneurysm NOS (ICD10-I71.9) 5. Bilateral occluded lower extremity dialysis grafts with small fluid collection surrounding the right femoral vasculature. This is favored to represent a postoperative seroma or hematoma. 6. Stable bilateral hyperdense renal cysts. 7. 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 Aortic Atherosclerosis (ICD10-I70.0). Electronically Signed   By: Constance Holster M.D.   On: 01/25/2020 02:42   DG Chest Port 1 View  Result Date: 01/24/2020 CLINICAL DATA:  Left-sided chest pain EXAM: PORTABLE CHEST 1 VIEW COMPARISON:  12/24/2019 FINDINGS: Airspace disease symmetrically at the bases with a few Kerley lines. No visible effusion. Perma catheter on the right with tip in good position. Cardiomegaly. No pneumothorax IMPRESSION: 1. Symmetric pulmonary opacity favoring edema. 2. More prominent cardiomegaly than on recent comparison. Electronically Signed   By: Monte Fantasia M.D.   On: 01/24/2020 07:43    Labs:  CBC: Recent Labs    01/09/20 0222 01/24/20 0712 01/25/20 0600 01/26/20 0338  WBC 4.0 7.5 8.0 7.9  HGB 8.5* 9.3* 9.3* 9.1*  HCT 27.0* 30.4* 30.6* 29.8*  PLT 160 293 283 313    COAGS: Recent Labs    01/13/20 0000 01/24/20 0712 01/25/20 0600 01/26/20 0338  INR 2.1* 1.2 1.4* 1.6*    BMP: Recent Labs    01/09/20 0222 01/24/20 0712 01/25/20 0600 01/26/20 0338  NA 139 142 140 138  K 4.0 3.6 4.6 4.1  CL 98 100 101 99  CO2 31 29 27 27   GLUCOSE 143* 119* 99 95  BUN 23 20 15  25*  CALCIUM 8.7* 9.6 9.0 9.0  CREATININE 4.41* 8.57* 5.97* 7.38*  GFRNONAA 12* 5* 8* 6*  GFRAA 14* 6* 10* 7*    LIVER FUNCTION TESTS: Recent Labs    12/24/19 0706 12/31/19 0356 01/03/20 1508 01/09/20 0222 01/24/20 0712 01/25/20 0600  BILITOT 0.9  --   --  0.5 0.6 0.5  AST 16  --   --  23 19 16   ALT 22  --   --  39 16 16  ALKPHOS 90  --   --  139* 149* 129*  PROT 5.2*  --   --  5.3* 6.1* 5.8*  ALBUMIN 2.4*   < >  2.2* 2.3* 2.9* 2.7*   < > = values in this interval not displayed.    TUMOR MARKERS: No results for input(s): AFPTM, CEA, CA199, CHROMGRNA in the  last 8760 hours.  Assessment and Plan:  ESRD Rt thigh dialysis graft placed 11/2019 Started [place] just 2 weeks ago-- used without issue for 4-5 dialysis sessions Now clotted Plan for evaluation and possible intervention in IR Has existing Rt IJ tunneled dialysis catheter in place and in use Risks and benefits discussed with the patient including, but not limited to bleeding, infection, vascular injury, pulmonary embolism, need for tunneled HD catheter placement or even death.  All of the patient's questions were answered, patient is agreeable to proceed. Consent signed and in chart.   Thank you for this interesting consult.  I greatly enjoyed meeting [facility] and look forward to participating in their care.  A copy of this report was sent to the requesting provider on this date.  Electronically Signed: Lavonia Drafts, PA-C 01/26/2020, 8:47 AM   I spent a total of 20 Minutes    in face to face in clinical consultation, greater than 50% of which was counseling/coordinating care for right thigh graft declot

## 2020-01-26 NOTE — Procedures (Signed)
Interventional Radiology Procedure Note  Procedure: US guided access right femoral loop graft x 2.  Mechanical and pharm thrombectomy.  Restoration of flow.  Findings: No venous or arterial stenosis was identified as the permissive lesion for the thrombosis.   Excellent thrill on completion.  .  Complications: None  Recommendations:  - Ok to use - Advise early referral for a duplex exam to measure flow volume and sampling for any possible stenosis that was occult by angio.  - Do not submerge for 7 days - Routine care   Signed,  Dulcy Fanny. Earleen Newport, DO

## 2020-01-26 NOTE — Progress Notes (Signed)
PROGRESS NOTE    [facility]  QVZ:[id number] DOB: Sep 24, 1941 DOA: 01/24/2020 PCP: [name], NP  Brief Narrative: David [name] is a 79 y.o. 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  He presented with complaints of shortness of breath and chest pain, which was described as feeling like a rope was tied around him started 1 day ago.   - He had been at a rehab facility after prolonged hospitalization due to COVID-19 infection.  Patient completed [id number] isolation on 01/01/2020.  He had gone to hemodialysis 2/20, but was transferred to the hospital due to reports of chest discomfort.  -Work-up in the emergency room noted fluid overload and was also found to be in A. fib RVR  Assessment & Plan:   Acute hypoxic respiratory failure Pulmonary edema, pleural effusions -Due to ongoing weight loss, needs dry weight lowered -Off BiPAP, improving post dialysis yesterday -Nephrology following -Wean O2, CTA chest negative for PE, noted bilateral pleural effusions -Volume management per renal -Resolved  Atrial fibrillation with RVR -Triggered by pulmonary edema, known history of A. fib on amiodarone -Was started on IV amiodarone on admission while on BiPAP, converted to sinus rhythm  after dialysis 2/20, transition back to home dose of oral amiodarone -Continue Coumadin, INR subtherapeutic, currently on heparin bridge  ESRD on hemodialysis -On dialysis Monday Tuesday Thursday Saturday -History of clotted right femoral AV graft, currently using a permacath -Per renal,  IR consulted for declotting of right thigh AV graft  Elevated troponin:  -High-sensitivity troponin 55-> 70.  Suspect secondary to demand with acute pulmonary edema -No evidence of ACS, denies chest pain today  Recent COVID-19 virus infection: Patient  diagnosed on 1/7, and out of the infectious window since 1/28.   Macrocytic anemia: Chronic.  Hemoglobin 9.3 which appears near patient's baseline. -Continue to monitor   History of HIV: CD4 T-cell absolute count 406 on 2/17. 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 -Continue outpatient follow-up with infectious disease   DVT prophylaxis: Heparin GTT Code Status:  DNR Family Communication:  No family at bedside, left message for wife yesterday Disposition Plan:  Back to rehab pending declotting of AV graft  Consultants:   Renal   Procedures:   Antimicrobials:    Subjective: -Feels okay, no events overnight, breathing better  Objective: Vitals:   01/26/20 0945 01/26/20 1015 01/26/20 1030 01/26/20 1032  BP: (!) 91/41 110/62 (!) 100/49 (!) 101/55  Pulse: 79 80 81 70  Resp:    19  Temp:    97.9 F (36.6 C)  TempSrc:    Oral  SpO2:    99%  Weight:    76.7 kg  Height:        Intake/Output Summary (Last 24 hours) at 01/26/2020 1203 Last data filed at 01/26/2020 1032 Gross per 24 hour  Intake 362.77 ml  Output 2481 ml  Net -2118.23 ml   Filed Weights   01/26/20 0436 01/26/20 0655 01/26/20 1032  Weight: 78.7 kg 78.7 kg 76.7 kg    Examination:  Gen: Chronically ill elderly male, sitting up in bed, AAO x2, no distress HEENT: Right IJ HD catheter, no JVD Lungs: Diminished breath sounds the bases, otherwise clear CVS: RRR,No Gallops,Rubs or new Murmurs Abd: soft, Non tender, non distended, BS present Extremities: No  edema Skin: no new rashes on exposed skin Psychiatry:  Mood & affect appropriate.     Data Reviewed:   CBC: Recent Labs  Lab 01/24/20 0712 01/25/20 0600 01/26/20 0338  WBC 7.5 8.0 7.9  NEUTROABS 5.2  --   --   HGB 9.3* 9.3* 9.1*  HCT 30.4* 30.6* 29.8*  MCV 107.0* 108.1* 106.8*  PLT 293 283 785   Basic Metabolic Panel: Recent Labs  Lab 01/24/20 0712 01/25/20 0600  01/26/20 0338  NA 142 140 138  K 3.6 4.6 4.1  CL 100 101 99  CO2 29 27 27   GLUCOSE 119* 99 95  BUN 20 15 25*  CREATININE 8.57* 5.97* 7.38*  CALCIUM 9.6 9.0 9.0   GFR: Estimated Creatinine Clearance: 9.1 mL/min (A) (by C-G formula based on SCr of 7.38 mg/dL (H)). Liver Function Tests: Recent Labs  Lab 01/24/20 0712 01/25/20 0600  AST 19 16  ALT 16 16  ALKPHOS 149* 129*  BILITOT 0.6 0.5  PROT 6.1* 5.8*  ALBUMIN 2.9* 2.7*   Recent Labs  Lab 01/24/20 0712  LIPASE 33   No results for input(s): AMMONIA in the last 168 hours. Coagulation Profile: Recent Labs  Lab 01/24/20 0712 01/25/20 0600 01/26/20 0338  INR 1.2 1.4* 1.6*   Cardiac Enzymes: No results for input(s): CKTOTAL, CKMB, CKMBINDEX, TROPONINI in the last 168 hours. BNP (last 3 results) No results for input(s): PROBNP in the last 8760 hours. HbA1C: No results for input(s): HGBA1C in the last 72 hours. CBG: No results for input(s): GLUCAP in the last 168 hours. Lipid Profile: No results for input(s): CHOL, HDL, LDLCALC, TRIG, CHOLHDL, LDLDIRECT in the last 72 hours. Thyroid Function Tests: No results for input(s): TSH, T4TOTAL, FREET4, T3FREE, THYROIDAB in the last 72 hours. Anemia Panel: No results for input(s): VITAMINB12, FOLATE, FERRITIN, TIBC, IRON, RETICCTPCT in the last 72 hours. Urine analysis:    Component Value Date/Time   COLORURINE YELLOW 02/17/2018 0540   APPEARANCEUR CLOUDY (A) 02/17/2018 0540   LABSPEC 1.010 02/17/2018 0540   PHURINE 5.0 02/17/2018 0540   GLUCOSEU NEGATIVE 02/17/2018 0540   HGBUR LARGE (A) 02/17/2018 0540   BILIRUBINUR NEGATIVE 02/17/2018 0540   BILIRUBINUR negative 04/16/2013 [place] 02/17/2018 0540   PROTEINUR 100 (A) 02/17/2018 0540   UROBILINOGEN 0.2 05/11/2014 1352   NITRITE NEGATIVE 02/17/2018 0540   LEUKOCYTESUR NEGATIVE 02/17/2018 0540   Sepsis Labs: @LABRCNTIP (procalcitonin:4,lacticidven:4)  ) Recent Results (from the past 240 hour(s))   MRSA PCR Screening     Status: None   Collection Time: 01/25/20  6:45 PM   Specimen: Nasal Mucosa; Nasopharyngeal  Result Value Ref Range Status   MRSA by PCR NEGATIVE NEGATIVE Final    Comment:        The GeneXpert MRSA Assay (FDA approved for NASAL specimens only), is one component of a comprehensive MRSA colonization surveillance program. It is not intended to diagnose MRSA infection nor to guide or monitor treatment for MRSA infections. Performed at [facility], [place] [address], [place], [place] [address]          Radiology Studies: CT Angio Chest PE W/Cm &/Or Wo Cm  Result Date: 01/25/2020 CLINICAL DATA:  Left upper quadrant pain. Shortness of breath. EXAM: CT ANGIOGRAPHY CHEST CT ABDOMEN AND PELVIS WITH CONTRAST TECHNIQUE: Multidetector CT imaging of the chest was performed using the standard protocol during bolus administration of intravenous contrast. Multiplanar CT image reconstructions and MIPs were obtained to evaluate the vascular anatomy. Multidetector  CT imaging of the abdomen and pelvis was performed using the standard protocol during bolus administration of intravenous contrast. CONTRAST:  128mL OMNIPAQUE IOHEXOL 350 MG/ML SOLN COMPARISON:  01/14/2019. FINDINGS: CTA CHEST FINDINGS Cardiovascular: There is no acute pulmonary embolism. The main pulmonary artery is significantly dilated measuring approximately 4.9 cm in diameter. There is a thoracic aortic aneurysm involving the ascending aorta currently measuring approximately 4.6 cm. There are minimal atherosclerotic changes of the thoracic aorta. There is cardiomegaly. There is no significant pericardial effusion. Coronary artery calcifications are noted. Is a well-positioned tunneled dialysis catheter with tip terminating near the cavoatrial junction. There is a small amount of adherent thrombus to the midportion of the catheter. Mediastinum/Nodes: --No mediastinal or hilar lymphadenopathy. --No axillary  lymphadenopathy. --No supraclavicular lymphadenopathy. --Normal thyroid gland. --The esophagus is unremarkable Lungs/Pleura: There are small to moderate-sized bilateral pleural effusions. There are patchy bilateral ground-glass airspace opacities, greatest at the lung bases bilaterally. There is no pneumothorax. The trachea is unremarkable. Musculoskeletal: No chest wall abnormality. No acute or significant osseous findings. Review of the MIP images confirms the above findings. CT ABDOMEN and PELVIS FINDINGS Hepatobiliary: The liver is normal. Normal gallbladder.There is no biliary ductal dilation. Pancreas: Normal contours without ductal dilatation. No peripancreatic fluid collection. Spleen: No splenic laceration or hematoma. Adrenals/Urinary Tract: --Adrenal glands: No adrenal hemorrhage. --Right kidney/ureter: There multiple hyperdense right renal cysts that appear grossly stable since prior study. There is no hydronephrosis. --Left kidney/ureter: There hyperdense left renal cyst that appears stable since prior study in 2020. There is no hydronephrosis. --Urinary bladder: The bladder is decompressed which limits evaluation. Stomach/Bowel: --Stomach/Duodenum: No hiatal hernia or other gastric abnormality. Normal duodenal course and caliber. --Small bowel: No dilatation or inflammation. --Colon: No focal abnormality. --Appendix: Normal. Vascular/Lymphatic: Atherosclerotic calcification is present within the non-aneurysmal abdominal aorta, without hemodynamically significant stenosis. There appear to be bilateral occluded lower extremity dialysis grafts. There is a small fluid collection surrounding the right femoral vasculature measuring approximately 2.3 cm. This is favored to represent a postoperative seroma or hematoma. --No retroperitoneal lymphadenopathy. --No mesenteric lymphadenopathy. --No pelvic or inguinal lymphadenopathy. 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 There  is a fluid density mass in the right inguinal canal which is stable across prior studies. Other: No ascites or free air. The abdominal wall is normal. Musculoskeletal. There are end-stage degenerative changes of the left hip. There are moderate degenerative changes of the right hip. Review of the MIP images confirms the above findings. IMPRESSION: 1. No acute pulmonary embolism. 2. Significantly dilated main pulmonary artery which can be seen in patients with pulmonary artery hypertension. 3. Small to moderate-sized bilateral pleural effusions with patchy bilateral ground-glass airspace opacities. This may be related to pulmonary edema or an atypical infectious process. 4. Ascending thoracic aortic aneurysm measuring approximately 4.6 cm. Ascending thoracic aortic aneurysm. Recommend semi-annual imaging followup by CTA or MRA and referral to cardiothoracic surgery if not already obtained. This recommendation follows 2010 ACCF/AHA/AATS/ACR/ASA/SCA/SCAI/SIR/STS/SVM Guidelines for the Diagnosis and Management of Patients With Thoracic Aortic Disease. Circulation. 2010; 121: E071-Q197. Aortic aneurysm NOS (ICD10-I71.9) 5. Bilateral occluded lower extremity dialysis grafts with small fluid collection surrounding the right femoral vasculature. This is favored to represent a postoperative seroma or hematoma. 6. Stable bilateral hyperdense renal cysts. 7. 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 Aortic Atherosclerosis (ICD10-I70.0). Electronically Signed   By: Constance Holster M.D.   On: 01/25/2020 02:42   CT Abdomen  Pelvis W Contrast  Result Date: 01/25/2020 CLINICAL DATA:  Left upper quadrant pain. Shortness of breath. EXAM: CT ANGIOGRAPHY CHEST CT ABDOMEN AND PELVIS WITH CONTRAST TECHNIQUE: Multidetector CT imaging of the chest was performed using the standard protocol during bolus administration of intravenous contrast. Multiplanar CT image reconstructions  and MIPs were obtained to evaluate the vascular anatomy. Multidetector CT imaging of the abdomen and pelvis was performed using the standard protocol during bolus administration of intravenous contrast. CONTRAST:  166mL OMNIPAQUE IOHEXOL 350 MG/ML SOLN COMPARISON:  01/14/2019. FINDINGS: CTA CHEST FINDINGS Cardiovascular: There is no acute pulmonary embolism. The main pulmonary artery is significantly dilated measuring approximately 4.9 cm in diameter. There is a thoracic aortic aneurysm involving the ascending aorta currently measuring approximately 4.6 cm. There are minimal atherosclerotic changes of the thoracic aorta. There is cardiomegaly. There is no significant pericardial effusion. Coronary artery calcifications are noted. Is a well-positioned tunneled dialysis catheter with tip terminating near the cavoatrial junction. There is a small amount of adherent thrombus to the midportion of the catheter. Mediastinum/Nodes: --No mediastinal or hilar lymphadenopathy. --No axillary lymphadenopathy. --No supraclavicular lymphadenopathy. --Normal thyroid gland. --The esophagus is unremarkable Lungs/Pleura: There are small to moderate-sized bilateral pleural effusions. There are patchy bilateral ground-glass airspace opacities, greatest at the lung bases bilaterally. There is no pneumothorax. The trachea is unremarkable. Musculoskeletal: No chest wall abnormality. No acute or significant osseous findings. Review of the MIP images confirms the above findings. CT ABDOMEN and PELVIS FINDINGS Hepatobiliary: The liver is normal. Normal gallbladder.There is no biliary ductal dilation. Pancreas: Normal contours without ductal dilatation. No peripancreatic fluid collection. Spleen: No splenic laceration or hematoma. Adrenals/Urinary Tract: --Adrenal glands: No adrenal hemorrhage. --Right kidney/ureter: There multiple hyperdense right renal cysts that appear grossly stable since prior study. There is no hydronephrosis. --Left  kidney/ureter: There hyperdense left renal cyst that appears stable since prior study in 2020. There is no hydronephrosis. --Urinary bladder: The bladder is decompressed which limits evaluation. Stomach/Bowel: --Stomach/Duodenum: No hiatal hernia or other gastric abnormality. Normal duodenal course and caliber. --Small bowel: No dilatation or inflammation. --Colon: No focal abnormality. --Appendix: Normal. Vascular/Lymphatic: Atherosclerotic calcification is present within the non-aneurysmal abdominal aorta, without hemodynamically significant stenosis. There appear to be bilateral occluded lower extremity dialysis grafts. There is a small fluid collection surrounding the right femoral vasculature measuring approximately 2.3 cm. This is favored to represent a postoperative seroma or hematoma. --No retroperitoneal lymphadenopathy. --No mesenteric lymphadenopathy. --No pelvic or inguinal lymphadenopathy. 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 There is a fluid density mass in the right inguinal canal which is stable across prior studies. Other: No ascites or free air. The abdominal wall is normal. Musculoskeletal. There are end-stage degenerative changes of the left hip. There are moderate degenerative changes of the right hip. Review of the MIP images confirms the above findings. IMPRESSION: 1. No acute pulmonary embolism. 2. Significantly dilated main pulmonary artery which can be seen in patients with pulmonary artery hypertension. 3. Small to moderate-sized bilateral pleural effusions with patchy bilateral ground-glass airspace opacities. This may be related to pulmonary edema or an atypical infectious process. 4. Ascending thoracic aortic aneurysm measuring approximately 4.6 cm. Ascending thoracic aortic aneurysm. Recommend semi-annual imaging followup by CTA or MRA and referral to cardiothoracic surgery if not already obtained. This recommendation follows 2010  ACCF/AHA/AATS/ACR/ASA/SCA/SCAI/SIR/STS/SVM Guidelines for the Diagnosis and Management of Patients With Thoracic Aortic Disease. Circulation. 2010; 121: W237-S283. Aortic aneurysm NOS (ICD10-I71.9) 5. Bilateral occluded lower extremity dialysis grafts with  small fluid collection surrounding the right femoral vasculature. This is favored to represent a postoperative seroma or hematoma. 6. Stable bilateral hyperdense renal cysts. 7. 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 Aortic Atherosclerosis (ICD10-I70.0). Electronically Signed   By: Constance Holster M.D.   On: 01/25/2020 02:42        Scheduled Meds: . allopurinol  100 mg Oral Daily  . amiodarone  200 mg Oral Daily  . bictegravir-emtricitabine-tenofovir AF  1 tablet Oral Daily  . brimonidine  1 drop Left Eye BID  . Chlorhexidine Gluconate Cloth  6 each Topical Daily  . dorzolamide-timolol  1 drop Left Eye BID  . latanoprost  1 drop Left Eye QHS  . loratadine  10 mg Oral Daily  . midodrine      . midodrine  10 mg Oral TID WC  . montelukast  10 mg Oral QHS  . multivitamin  1 tablet Oral QHS  . sodium chloride flush  3 mL Intravenous Q12H  . sucralfate  1 g Oral Once  . sucroferric oxyhydroxide  500 mg Oral TID WC  . Warfarin - Pharmacist Dosing Inpatient   Does not apply q1800   Continuous Infusions: . heparin 950 Units/hr (01/25/20 1800)     LOS: 2 days    Time spent: 53min  Domenic Polite, MD [facility]  01/26/2020, 12:03 PM

## 2020-01-26 NOTE — Plan of Care (Signed)
?  Problem: Clinical Measurements: ?Goal: Will remain free from infection ?Outcome: Progressing ?  ?

## 2020-01-26 NOTE — Progress Notes (Addendum)
ANTICOAGULATION CONSULT NOTE - Follow Up Consult  Pharmacy Consult for Heparin, Coumadin Indication: atrial fibrillation  Allergies  Allergen Reactions  . Dextromethorphan-Guaifenesin Other (See Comments)    Unknown reaction  . Tocotrienols Other (See Comments)    Unknown reaction  . Losartan Potassium Other (See Comments)    Causes constipation    Patient Measurements: Height: 6\' 2"  (188 cm) Weight: 173 lb 8 oz (78.7 kg) IBW/kg (Calculated) : 82.2  Vital Signs: Temp: 97.9 F (36.6 C) (02/22 0655) Temp Source: Oral (02/22 0655) BP: 136/75 (02/22 0655) Pulse Rate: 73 (02/22 0655)  Labs: Recent Labs     0000 01/24/20 0712 01/24/20 0910 01/24/20 1907 01/24/20 1907 01/25/20 0600 01/25/20 0600 01/25/20 1003 01/25/20 1403 01/26/20 0338  HGB   < > 9.3*  --   --   --  9.3*  --   --   --  9.1*  HCT  --  30.4*  --   --   --  30.6*  --   --   --  29.8*  PLT  --  293  --   --   --  283  --   --   --  313  LABPROT  --  15.5*  --   --   --  17.0*  --   --   --  19.0*  INR  --  1.2  --   --   --  1.4*  --   --   --  1.6*  HEPARINUNFRC  --   --   --  1.02*   < > 0.26*   < > 0.30 0.24* 0.33  CREATININE  --  8.57*  --   --   --  5.97*  --   --   --  7.38*  TROPONINIHS  --  55* 70* 110*  --   --   --   --   --   --    < > = values in this interval not displayed.    Estimated Creatinine Clearance: 9.3 mL/min (A) (by C-G formula based on SCr of 7.38 mg/dL (H)).  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 On warfarin PTA for afib, Chronic anemia stable.  Hgb 9.1. INR up to 1.6. Hep level 0.33 (therapeutic) -PTA warfarin dosing: 5mg  daily except 2.5mg  on Mon/Fri. INR 1.2 on admit.  INR rising quickly on home dose. Will give home dose 2.5mg  today to prevent overshooting goal.  Goal of Therapy:  Heparin level 0.3-0.7 units/ml  INR 2-3 Monitor platelets by anticoagulation protocol: Yes   Plan:  IV heparin continue at 950 units/hr Warfarin 2.5 mg PO x 1 today Daily INR/HL,  CBC, s/s bleeding   Chaniah Cisse A. Levada Dy, PharmD, BCPS, FNKF Clinical Pharmacist [place] Please utilize Amion for appropriate phone number to reach the unit pharmacist ([place])   Addendum:  Warfarin dose rescheduled by IR due to possible IR procedure. Will hold warfarin per IR tonight and reevaluate dose of warfarin for 2/23 tomorrow post procedure.    Malicia Blasdel A. Levada Dy, PharmD, BCPS, FNKF Clinical Pharmacist [place] Please utilize Amion for appropriate phone number to reach the unit pharmacist ([place])    01/26/2020,7:43 AM

## 2020-01-26 NOTE — Consult Note (Signed)
   [facility] Inpatient Consult   01/26/2020  [name] 09-01-1941 [id number]    [facility] PENDINGStatus:   Patientwas recently assigned for outreachby [facility] CM(made aware of admission)for transition of care prior to readmission.  Patient will receive a post hospital call and will be evaluated for assessments and disease process educationif transitioning to home.  Patient wasreviewed for 30 day readmission, with 32% extreme high risk score for unplanned readmission, 4 hospitalizations and 4 ED visits in the past 6 months,under his [place] insurance benefit.  Chart review reveals, MD brief narrativeshowingas: Patient had been at a rehab facility [facility]) after prolonged hospitalization due to COVID-19 infection. Patient completed [id number] isolation on 01/01/2020. He had gone to hemodialysis 2/20, but was transferred to the hospital due to reports of chest discomfort.  -Work-up showed fluid overload and was also found to be in A. fib RVR . (Acute hypoxic respiratory failure, Pulmonary edema, pleural effusions)  Per MD note, disposition plan is to return back to rehab pending declotting of AV graft.  Primary care provider is [name], NP with [facility], listed to provide transition of care follow-up.  Plan: Will follow progress anddisposition plan. UpdateTHN RN CM of patient's dischargerecommendationand disposition.   For additional information and questions, please call:  Ritik Stavola A. Wrigley Winborne, BSN, RN-BC [facility] Liaison Cell: [phone]

## 2020-01-26 NOTE — Progress Notes (Signed)
bipap is PRN order, no distress noted, sat is 100% on RA.  Will continue to monitor.

## 2020-01-27 ENCOUNTER — Other Ambulatory Visit: Payer: Self-pay

## 2020-01-27 DIAGNOSIS — C61 Malignant neoplasm of prostate: Secondary | ICD-10-CM | POA: Diagnosis not present

## 2020-01-27 DIAGNOSIS — I428 Other cardiomyopathies: Secondary | ICD-10-CM | POA: Diagnosis not present

## 2020-01-27 DIAGNOSIS — U071 COVID-19: Secondary | ICD-10-CM | POA: Diagnosis not present

## 2020-01-27 DIAGNOSIS — I5042 Chronic combined systolic (congestive) and diastolic (congestive) heart failure: Secondary | ICD-10-CM | POA: Diagnosis not present

## 2020-01-27 DIAGNOSIS — T82868A Thrombosis of vascular prosthetic devices, implants and grafts, initial encounter: Secondary | ICD-10-CM | POA: Diagnosis not present

## 2020-01-27 DIAGNOSIS — E46 Unspecified protein-calorie malnutrition: Secondary | ICD-10-CM | POA: Diagnosis not present

## 2020-01-27 DIAGNOSIS — R279 Unspecified lack of coordination: Secondary | ICD-10-CM | POA: Diagnosis not present

## 2020-01-27 DIAGNOSIS — Z20828 Contact with and (suspected) exposure to other viral communicable diseases: Secondary | ICD-10-CM | POA: Diagnosis not present

## 2020-01-27 DIAGNOSIS — T82898A Other specified complication of vascular prosthetic devices, implants and grafts, initial encounter: Secondary | ICD-10-CM | POA: Diagnosis not present

## 2020-01-27 DIAGNOSIS — Z743 Need for continuous supervision: Secondary | ICD-10-CM | POA: Diagnosis not present

## 2020-01-27 DIAGNOSIS — J9600 Acute respiratory failure, unspecified whether with hypoxia or hypercapnia: Secondary | ICD-10-CM | POA: Diagnosis not present

## 2020-01-27 DIAGNOSIS — B20 Human immunodeficiency virus [HIV] disease: Secondary | ICD-10-CM | POA: Diagnosis not present

## 2020-01-27 DIAGNOSIS — E7800 Pure hypercholesterolemia, unspecified: Secondary | ICD-10-CM | POA: Diagnosis not present

## 2020-01-27 DIAGNOSIS — R079 Chest pain, unspecified: Secondary | ICD-10-CM | POA: Diagnosis not present

## 2020-01-27 DIAGNOSIS — I480 Paroxysmal atrial fibrillation: Secondary | ICD-10-CM | POA: Diagnosis not present

## 2020-01-27 DIAGNOSIS — N186 End stage renal disease: Secondary | ICD-10-CM | POA: Diagnosis not present

## 2020-01-27 DIAGNOSIS — D509 Iron deficiency anemia, unspecified: Secondary | ICD-10-CM | POA: Diagnosis not present

## 2020-01-27 DIAGNOSIS — I10 Essential (primary) hypertension: Secondary | ICD-10-CM | POA: Diagnosis not present

## 2020-01-27 DIAGNOSIS — A429 Actinomycosis, unspecified: Secondary | ICD-10-CM | POA: Diagnosis not present

## 2020-01-27 DIAGNOSIS — I2510 Atherosclerotic heart disease of native coronary artery without angina pectoris: Secondary | ICD-10-CM | POA: Diagnosis not present

## 2020-01-27 DIAGNOSIS — I959 Hypotension, unspecified: Secondary | ICD-10-CM | POA: Diagnosis not present

## 2020-01-27 DIAGNOSIS — T82858A Stenosis of vascular prosthetic devices, implants and grafts, initial encounter: Secondary | ICD-10-CM | POA: Diagnosis not present

## 2020-01-27 DIAGNOSIS — I132 Hypertensive heart and chronic kidney disease with heart failure and with stage 5 chronic kidney disease, or end stage renal disease: Secondary | ICD-10-CM | POA: Diagnosis not present

## 2020-01-27 DIAGNOSIS — I509 Heart failure, unspecified: Secondary | ICD-10-CM | POA: Diagnosis not present

## 2020-01-27 DIAGNOSIS — I482 Chronic atrial fibrillation, unspecified: Secondary | ICD-10-CM | POA: Diagnosis not present

## 2020-01-27 DIAGNOSIS — N2581 Secondary hyperparathyroidism of renal origin: Secondary | ICD-10-CM | POA: Diagnosis not present

## 2020-01-27 DIAGNOSIS — I5040 Unspecified combined systolic (congestive) and diastolic (congestive) heart failure: Secondary | ICD-10-CM | POA: Diagnosis not present

## 2020-01-27 DIAGNOSIS — I447 Left bundle-branch block, unspecified: Secondary | ICD-10-CM | POA: Diagnosis not present

## 2020-01-27 DIAGNOSIS — Z1383 Encounter for screening for respiratory disorder NEC: Secondary | ICD-10-CM | POA: Diagnosis not present

## 2020-01-27 DIAGNOSIS — I4891 Unspecified atrial fibrillation: Secondary | ICD-10-CM | POA: Diagnosis not present

## 2020-01-27 DIAGNOSIS — Z7189 Other specified counseling: Secondary | ICD-10-CM | POA: Diagnosis not present

## 2020-01-27 DIAGNOSIS — Z21 Asymptomatic human immunodeficiency virus [HIV] infection status: Secondary | ICD-10-CM | POA: Diagnosis not present

## 2020-01-27 DIAGNOSIS — Z5181 Encounter for therapeutic drug level monitoring: Secondary | ICD-10-CM | POA: Diagnosis not present

## 2020-01-27 DIAGNOSIS — D696 Thrombocytopenia, unspecified: Secondary | ICD-10-CM | POA: Diagnosis not present

## 2020-01-27 DIAGNOSIS — I639 Cerebral infarction, unspecified: Secondary | ICD-10-CM | POA: Diagnosis not present

## 2020-01-27 DIAGNOSIS — E8770 Fluid overload, unspecified: Secondary | ICD-10-CM | POA: Diagnosis not present

## 2020-01-27 DIAGNOSIS — K219 Gastro-esophageal reflux disease without esophagitis: Secondary | ICD-10-CM | POA: Diagnosis not present

## 2020-01-27 DIAGNOSIS — E119 Type 2 diabetes mellitus without complications: Secondary | ICD-10-CM | POA: Diagnosis not present

## 2020-01-27 DIAGNOSIS — Z992 Dependence on renal dialysis: Secondary | ICD-10-CM | POA: Diagnosis not present

## 2020-01-27 DIAGNOSIS — J9601 Acute respiratory failure with hypoxia: Secondary | ICD-10-CM | POA: Diagnosis not present

## 2020-01-27 DIAGNOSIS — J810 Acute pulmonary edema: Secondary | ICD-10-CM | POA: Diagnosis not present

## 2020-01-27 DIAGNOSIS — I9589 Other hypotension: Secondary | ICD-10-CM | POA: Diagnosis not present

## 2020-01-27 LAB — BASIC METABOLIC PANEL
Anion gap: 14 (ref 5–15)
BUN: 15 mg/dL (ref 8–23)
CO2: 25 mmol/L (ref 22–32)
Calcium: 8.9 mg/dL (ref 8.9–10.3)
Chloride: 97 mmol/L — ABNORMAL LOW (ref 98–111)
Creatinine, Ser: 5.25 mg/dL — ABNORMAL HIGH (ref 0.61–1.24)
GFR calc Af Amer: 11 mL/min — ABNORMAL LOW (ref >60)
GFR calc non Af Amer: 10 mL/min — ABNORMAL LOW (ref >60)
Glucose, Bld: 84 mg/dL (ref 70–99)
Potassium: 3.9 mmol/L (ref 3.5–5.1)
Sodium: 136 mmol/L (ref 135–145)

## 2020-01-27 LAB — HEPARIN LEVEL (UNFRACTIONATED): Heparin Unfractionated: 0.23 IU/mL — ABNORMAL LOW (ref 0.30–0.70)

## 2020-01-27 LAB — PROTIME-INR
INR: 1.5 — ABNORMAL HIGH (ref 0.8–1.2)
Prothrombin Time: 17.8 seconds — ABNORMAL HIGH (ref 11.4–15.2)

## 2020-01-27 LAB — CBC
HCT: 30.5 % — ABNORMAL LOW (ref 39.0–52.0)
Hemoglobin: 9.2 g/dL — ABNORMAL LOW (ref 13.0–17.0)
MCH: 32.1 pg (ref 26.0–34.0)
MCHC: 30.2 g/dL (ref 30.0–36.0)
MCV: 106.3 fL — ABNORMAL HIGH (ref 80.0–100.0)
Platelets: 239 10*3/uL (ref 150–400)
RBC: 2.87 MIL/uL — ABNORMAL LOW (ref 4.22–5.81)
RDW: 18.9 % — ABNORMAL HIGH (ref 11.5–15.5)
WBC: 7.1 10*3/uL (ref 4.0–10.5)
nRBC: 0.4 % — ABNORMAL HIGH (ref 0.0–0.2)

## 2020-01-27 LAB — HEPATITIS B SURFACE ANTIGEN: Hepatitis B Surface Ag: NEGATIVE — AB

## 2020-01-27 MED ORDER — WARFARIN SODIUM 5 MG PO TABS: 5.0000 mg | ORAL_TABLET | Freq: Once | ORAL | Status: DC

## 2020-01-27 MED ORDER — TRAMADOL HCL 50 MG PO TABS: 50.0000 mg | ORAL_TABLET | Freq: Four times a day (QID) | ORAL | 0 refills | Status: DC | PRN

## 2020-01-27 MED ORDER — WARFARIN SODIUM 5 MG PO TABS: ORAL_TABLET | ORAL | 0 refills | Status: DC

## 2020-01-27 NOTE — Progress Notes (Signed)
Report called to R. Tyrone Nine, [place], [place] SN[facility] receiving Nurse. Made aware patient going to outpatient HD Center prior to arriving to the Facility.  Questions answered and verbalized understanding. Provided this RN Call Back number if any additional questions or concerns. Leveda Anna, BSN, RN

## 2020-01-27 NOTE — Progress Notes (Signed)
[place] for Coumadin Indication: atrial fibrillation  Allergies  Allergen Reactions  . Dextromethorphan-Guaifenesin Other (See Comments)    Unknown reaction  . Tocotrienols Other (See Comments)    Unknown reaction  . Losartan Potassium Other (See Comments)    Causes constipation    Patient Measurements: Height: 6\' 2"  (188 cm) Weight: 166 lb 7.2 oz (75.5 kg) IBW/kg (Calculated) : 82.2  Vital Signs: Temp: 98.9 F (37.2 C) (02/23 0917) Temp Source: Oral (02/23 0917) BP: 99/55 (02/23 0917) Pulse Rate: 85 (02/23 0917)  Labs: Recent Labs    01/24/20 1907 01/24/20 1907 01/25/20 0600 01/25/20 0600 01/25/20 1003 01/25/20 1403 01/26/20 0338 01/27/20 0334  HGB  --   --  9.3*   < >  --   --  9.1* 9.2*  HCT  --   --  30.6*  --   --   --  29.8* 30.5*  PLT  --   --  283  --   --   --  313 239  LABPROT  --   --  17.0*  --   --   --  19.0* 17.8*  INR  --   --  1.4*  --   --   --  1.6* 1.5*  HEPARINUNFRC 1.02*   < > 0.26*  --    < > 0.24* 0.33 0.23*  CREATININE  --   --  5.97*  --   --   --  7.38* 5.25*  TROPONINIHS 110*  --   --   --   --   --   --   --    < > = values in this interval not displayed.    Estimated Creatinine Clearance: 12.6 mL/min (A) (by C-G formula based on SCr of 5.25 mg/dL (H)).  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 On warfarin PTA for afib.  Heparin level low this morning, new orders to stop heparin prior to discharge today.   Sp IR 2/22 for [place] loop R femoral graft/thrombectomy.   INR still low at 1.5, no dose given last night d/t IR procedure. Will give extra warfarin tonight.   Goal of Therapy:  INR 2-3 Monitor platelets by anticoagulation protocol: Yes   Plan:  If sending home would resume prior dose of 5mg  all days except 2.5mg  on Monday and Friday.  Would have patient follow up early next week with coumadin clinic   Erin Hearing PharmD., BCPS Clinical Pharmacist 01/27/2020 10:30 AM

## 2020-01-27 NOTE — Progress Notes (Signed)
[facility] Progress Note   Subjective:  R thigh AVG declotted yest per IR, no c/o today, going to be dc'd   Objective Vitals:   01/26/20 2012 01/27/20 0042 01/27/20 0458 01/27/20 0917  BP: (!) 113/59 113/62 116/63 (!) 99/55  Pulse: 71 67 69 85  Resp: 18 16 18 18   Temp: 98 F (36.7 C) 98.1 F (36.7 C) 98 F (36.7 C) 98.9 F (37.2 C)  TempSrc: Oral Oral Oral Oral  SpO2: 100% 100% 99% 98%  Weight:   75.5 kg   Height:       Physical Exam General: Well appearing man, NAD Heart: RRR; no murmur Lungs: CTA anteriorly Abdomen: soft, non-tender Extremities: No LE edema Dialysis Access:  R thigh AVG - no bruit, TDC in R chest  Additional Objective Labs: Basic Metabolic Panel: Recent Labs  Lab 01/25/20 0600 01/26/20 0338 01/27/20 0334  NA 140 138 136  K 4.6 4.1 3.9  CL 101 99 97*  CO2 27 27 25   GLUCOSE 99 95 84  BUN 15 25* 15  CREATININE 5.97* 7.38* 5.25*  CALCIUM 9.0 9.0 8.9   Liver Function Tests: Recent Labs  Lab 01/24/20 0712 01/25/20 0600  AST 19 16  ALT 16 16  ALKPHOS 149* 129*  BILITOT 0.6 0.5  PROT 6.1* 5.8*  ALBUMIN 2.9* 2.7*   Recent Labs  Lab 01/24/20 0712  LIPASE 33   CBC: Recent Labs  Lab 01/24/20 0712 01/24/20 0712 01/25/20 0600 01/26/20 0338 01/27/20 0334  WBC 7.5   < > 8.0 7.9 7.1  NEUTROABS 5.2  --   --   --   --   HGB 9.3*   < > 9.3* 9.1* 9.2*  HCT 30.4*   < > 30.6* 29.8* 30.5*  MCV 107.0*  --  108.1* 106.8* 106.3*  PLT 293   < > 283 313 239   < > = values in this interval not displayed.   Medications: . heparin 1,100 Units/hr (01/27/20 7353)   . allopurinol  100 mg Oral Daily  . amiodarone  200 mg Oral Daily  . bictegravir-emtricitabine-tenofovir AF  1 tablet Oral Daily  . brimonidine  1 drop Left Eye BID  . Chlorhexidine Gluconate Cloth  6 each Topical Daily  . Chlorhexidine Gluconate Cloth  6 each Topical Q0600  . dorzolamide-timolol  1 drop Left Eye BID  . latanoprost  1 drop Left Eye QHS  .  loratadine  10 mg Oral Daily  . midodrine  10 mg Oral TID WC  . montelukast  10 mg Oral QHS  . multivitamin  1 tablet Oral QHS  . sodium chloride flush  3 mL Intravenous Q12H  . sucralfate  1 g Oral Once  . sucroferric oxyhydroxide  500 mg Oral TID WC  . Warfarin - Pharmacist Dosing Inpatient   Does not apply q1800    Dialysis Orders: [facility] at [place], EDW 79.5kg, 2K/2Ca, TDC + R fem AVG, heparin 2000 - Calcitriol 0.17mcg PO q HD - Mircera 116mcg IV q 2 weeks (last 2/13)  Assessment/Plan: 1. Volume overload/Pulmonary edema: BiPAP on admit - improved with HD. HD again yest w/ 2500 cc UF. Resolved. Lower dry wt at dc 2. Rapid Afib v. VT on admit: On amio/coumadin at home - per primary 3. ESRD: had HD here yest. Next HD due today.  4. Chest Pain: On admit - felt d/t #1. 5. Hypotension/volume: On midodrine TIW. 6. Anemiaof ESRD: Hgb 9.1 - not due for ESA  yet. 7. Metabolic bone disease: CorrCa a little high, calcitriol on hold. Phos pending. 8. Clotted R fem AVG: Using [facility] for HD - IR consulted. 9. Nutrition: Alb low, add pro-stat supplements. 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  11. Hx CVA 12. Hx ascending thoracic aortic aneurysm   13.      Dispo - for dc to SNF today, possibly he can go to OP unit for HD today on the way to SNF, but if not will do 3h HD here.   Kelly Splinter, MD 01/27/2020, 10:22 AM

## 2020-01-27 NOTE — Patient Outreach (Signed)
[facility]) Care Management  01/27/2020  [facility] 12/23/1940 [id number]   Transition of Care Referral  Referral Date:01/20/2020 Referral Source:[employer] Discharge Report Date of Discharge: Facility:[facility] SNF Insurance:[employer] Medicare   RN CM received notification from [place] that patient admitted to the hospital on 01/24/2020 and discharged on 01/27/2020 back to SNF.   Plan: RN CM will close case at this time as patient remains at SNF.   Enzo Montgomery, RN,BSN,CCM [place] Management Telephonic Care Management Coordinator Direct Phone: [phone] Toll Free: [phone] Fax: [phone]

## 2020-01-27 NOTE — TOC Transition Note (Signed)
Transition of Care [facility]) - CM/SW Discharge Note   Patient Details  Name: David [name] MRN: [id number] Date of Birth: [birth year]  [facility]) CM/SW Contact:  Vinie Sill, [place] Phone Number: 01/27/2020, 10:48 AM   Clinical Narrative:     Patient will DC to: [place] Date: 01/27/2020 Family Notified: Joan,spouse Transport By: Corey Harold  RN, patient, and facility notified of DC. Discharge Summary sent to facility. RN given number for [phone]. Ambulance transport requested for patient.   Clinical Social Worker signing off. Thurmond Butts, MSW, LCSWA Clinical Social Worker    Final next level of care: [place]     Patient Goals and CMS Choice        Discharge Placement              Patient chooses bed at: [facility] Patient to be transferred to facility by: [place] Name of family member notified: Joan,spouse Patient and family notified of of transfer: 01/27/20  Discharge Plan and Services                                     Social Determinants of Health (SDOH) Interventions     Readmission Risk Interventions Readmission Risk Prevention Plan 01/09/2020 09/02/2019  Transportation Screening Complete Complete  Medication Review [employer]) Referral to Pharmacy Complete  PCP or Specialist appointment within 3-5 days of discharge Not Complete -  PCP/Specialist Appt Not Complete comments discharge to SNF -  [place] or [place] Not Complete -  [place] or Home Care Consult Pt Refusal Comments discharge to SNF -  SW Recovery Care/Counseling Consult Complete -  Palliative Care Screening Complete Not Applicable  Skilled Nursing Facility Complete -  Some recent data might be hidden

## 2020-01-27 NOTE — Progress Notes (Signed)
Patient cleared for discharge and will go straight to [facility] HD clinic[facility] today for treatment. [facility] to provide transportation to clinic and SNF/[facility] agreeable for pick-up at clinic post treatment per CSW/C. Johnson. OP HD clinic aware that patient may be late due to hospital discharge. He had a treatment yesterday and does not need a full treatment today per Renal PA/K. Stovall, who will send orders to OP HD Clinic. Bedside and Charge RN aware of plan. Patient aware and happy with plan.  Alphonzo Cruise, [place] Renal Navigator [phone]

## 2020-01-27 NOTE — Discharge Summary (Signed)
Physician Discharge Summary  [facility] ZYS:[id number] DOB: Apr 08, 1941 DOA: 01/24/2020  PCP: [name], NP  Admit date: 01/24/2020 Discharge date: 01/27/2020  Time spent: 35 minutes  Recommendations for Outpatient Follow-up:  PCP in 1 week Infectious disease Dr. 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 Severe protein calorie malnutrition   Pulmonary edema   Volume overload   Paroxysmal atrial fibrillation (HCC)   Chest pain   Subtherapeutic international normalized ratio (INR)   Elevated troponin   HIV DO NOT RESUSCITATE  Discharge Condition: Stable  Diet recommendation: Renal  Filed Weights   01/26/20 0655 01/26/20 1032 01/27/20 0458  Weight: 78.7 kg 76.7 kg 75.5 kg    History of present illness:  Larose a 79 y.o.malewith medical history significant ofESRD on HD, HTN, 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 He presented with complaints of shortness of breath and chest pain, which was described as feeling like a rope was tied around him started 1 day ago.  -He had been at a rehab facility after prolonged hospitalization due to COVID-19 infection. Patient completed ATFTD-32 isolation on 01/01/2020. He had gone to hemodialysis 2/20, but was transferred to the hospital due to reports of chest discomfort.  -Work-up in the emergency room noted fluid overload and was also found to be in A. fib RVR   Hospital Course:   Acute hypoxic respiratory failure Pulmonary edema, pleural effusions -Due to ongoing weight loss, needed dry weight lowered -Off BiPAP, improving post extra dialysis -Nephrology following -Wean O2, CTA chest negative for  PE, noted bilateral pleural effusions -Volume management per renal -He will be discharged today to his outpatient dialysis center, arrangements made by nephrology for him to go to his center be dialyzed and then go back home  Atrial fibrillation with RVR -Triggered by pulmonary edema, known history of A. fib on amiodarone -Was started on IV amiodarone on admission while on BiPAP, converted to sinus rhythm  after dialysis 2/20, transition back to home dose of oral amiodarone -Continue Coumadin, INR subtherapeutic at discharge, continue Coumadin at a higher dose of 5 mg daily for the next 3 days and then check INR in 2 to 3 days  Acute on chronic systolic and diastolic CHF -EF of 20%, volume managed with dialysis -Not on cardiac meds due to chronic hypotension  ESRD on hemodialysis -On dialysis Monday Tuesday Thursday Saturday -History of clotted right femoral AV graft, currently using a permacath -Per renal,  IR consulted, underwent declotting of right thigh AV graft yesterday  Elevated troponin:  -High-sensitivity troponin 55->70. Suspect secondary to demand with acute pulmonary edema -No evidence of ACS, denies chest pain  Recent COVID-19 virus infection: Patient diagnosed on 1/7, and out of the infectious window since 1/28.   Macrocytic anemia: Chronic. Hemoglobin 9.3 which appears near patient's baseline. -Continue to monitor  History of HIV: CD4 T-cell absolute count 406 on 2/17. 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 -Continue outpatient follow-up with infectious disease  Code Status:DNR  Procedures: Extra dialysis Declotting of right thigh AV graft 2/22  Discharge Exam: Vitals:   01/27/20 0458 01/27/20 0917  BP: 116/63 (!) 99/55  Pulse: 69 85  Resp: 18 18  Temp: 98 F (36.7 C) 98.9 F (37.2 C)  SpO2: 99% 98%    General: AAOx3, chronically ill male Cardiovascular:S1S2/RRR Respiratory:  CTAB  Discharge Instructions   Discharge Instructions    Discharge instructions   Complete by: As directed    Renal Diabetic   Increase activity slowly   Complete by: As directed      Allergies as of 01/27/2020      Reactions   Dextromethorphan-guaifenesin Other (See Comments)   Unknown reaction   Tocotrienols Other (See Comments)   Unknown reaction   Losartan Potassium Other (See Comments)   Causes constipation      Medication List    TAKE these medications   acetaminophen 500 MG tablet Commonly known as: TYLENOL Take 1,000 mg by mouth every 6 (six) hours as needed for moderate pain.   allopurinol 100 MG tablet Commonly known as: ZYLOPRIM 1 tablet by mouth 3x weekly after dialysis. What changed:   how much to take  how to take this  when to take this  additional instructions   amiodarone 200 MG tablet Commonly known as: PACERONE Take 1 tablet (200 mg total) by mouth daily. What changed: when to take this   [name] 50-200-25 MG Tabs tablet Generic drug: bictegravir-emtricitabine-tenofovir AF Take 1 tablet by mouth daily. What changed: when to take this   brimonidine 0.15 % ophthalmic solution Commonly known as: ALPHAGAN Place 1 drop into the left eye 2 (two) times daily.   dorzolamide-timolol 22.3-6.8 MG/ML ophthalmic solution Commonly known as: COSOPT Place 1 drop into the left eye 2 (two) times daily.   latanoprost 0.005 % ophthalmic solution Commonly known as: XALATAN Place 1 drop into the left eye at bedtime.   loratadine 10 MG tablet Commonly known as: CLARITIN Take 10 mg by mouth daily at 12 noon.   midodrine 5 MG tablet Commonly known as: PROAMATINE Take 5 mg by mouth See admin instructions. Take one tablet (5 mg) by mouth at 9 am on non-dialysis days - Sunday, Monday, Wednesday, Friday What changed: Another medication with the same name was changed. Make sure you understand how and when to take each.   midodrine 10 MG tablet Commonly  known as: PROAMATINE Take 1 tablet (10 mg total) by mouth as directed. Twice a day on the day of HD Tuesday, Thursday, Saturday (1 in AM and 1 at [place]). Take 1/2 tablet (40m) a day on non HD days What changed:   when to take this  additional instructions   montelukast 10 MG tablet Commonly known as: SINGULAIR Take 1 tablet (10 mg total) by mouth at bedtime.   multivitamin with minerals Tabs tablet Take 1 tablet by mouth daily at 2 PM.   polyethylene glycol 17 g packet Commonly known as: MIRALAX / GLYCOLAX Take 17 g by mouth every evening.   traMADol 50 MG tablet Commonly known as: Ultram Take 1 tablet (50 mg total) by mouth every 6 (six) hours as needed. What changed: reasons to take this   Velphoro 500 MG chewable tablet Generic drug: sucroferric oxyhydroxide Chew 500 mg by mouth 3 (three) times daily with meals.   warfarin 5 MG tablet Commonly known as: COUMADIN Take as directed. If you are unsure how to take this medication, talk to your nurse or doctor. Original instructions: Take 573mdaily for next 3days and then check INR 2/25, goal INR 2-3 What changed:   See the new instructions.  Another medication with  the same name was removed. Continue taking this medication, and follow the directions you see here.      Allergies  Allergen Reactions  . Dextromethorphan-Guaifenesin Other (See Comments)    Unknown reaction  . Tocotrienols Other (See Comments)    Unknown reaction  . Losartan Potassium Other (See Comments)    Causes constipation   Follow-up Information    [name], NP. Schedule an appointment as soon as possible for a visit in 1 week(s).   Specialty: Internal Medicine Contact information: [place] [address] [phone]        Tommy Medal, Lavell Islam, MD .   Specialty: Infectious Diseases Contact information: [address] [place] [address] [phone]            The results of  significant diagnostics from this hospitalization (including imaging, microbiology, ancillary and laboratory) are listed below for reference.    Significant Diagnostic Studies: CT Angio Chest PE W/Cm &/Or Wo Cm  Result Date: 01/25/2020 CLINICAL DATA:  Left upper quadrant pain. Shortness of breath. EXAM: CT ANGIOGRAPHY CHEST CT ABDOMEN AND PELVIS WITH CONTRAST TECHNIQUE: Multidetector CT imaging of the chest was performed using the standard protocol during bolus administration of intravenous contrast. Multiplanar CT image reconstructions and MIPs were obtained to evaluate the vascular anatomy. Multidetector CT imaging of the abdomen and pelvis was performed using the standard protocol during bolus administration of intravenous contrast. CONTRAST:  131m OMNIPAQUE IOHEXOL 350 MG/ML SOLN COMPARISON:  01/14/2019. FINDINGS: CTA CHEST FINDINGS Cardiovascular: There is no acute pulmonary embolism. The main pulmonary artery is significantly dilated measuring approximately 4.9 cm in diameter. There is a thoracic aortic aneurysm involving the ascending aorta currently measuring approximately 4.6 cm. There are minimal atherosclerotic changes of the thoracic aorta. There is cardiomegaly. There is no significant pericardial effusion. Coronary artery calcifications are noted. Is a well-positioned tunneled dialysis catheter with tip terminating near the cavoatrial junction. There is a small amount of adherent thrombus to the midportion of the catheter. Mediastinum/Nodes: --No mediastinal or hilar lymphadenopathy. --No axillary lymphadenopathy. --No supraclavicular lymphadenopathy. --Normal thyroid gland. --The esophagus is unremarkable Lungs/Pleura: There are small to moderate-sized bilateral pleural effusions. There are patchy bilateral ground-glass airspace opacities, greatest at the lung bases bilaterally. There is no pneumothorax. The trachea is unremarkable. Musculoskeletal: No chest wall abnormality. No acute or  significant osseous findings. Review of the MIP images confirms the above findings. CT ABDOMEN and PELVIS FINDINGS Hepatobiliary: The liver is normal. Normal gallbladder.There is no biliary ductal dilation. Pancreas: Normal contours without ductal dilatation. No peripancreatic fluid collection. Spleen: No splenic laceration or hematoma. Adrenals/Urinary Tract: --Adrenal glands: No adrenal hemorrhage. --Right kidney/ureter: There multiple hyperdense right renal cysts that appear grossly stable since prior study. There is no hydronephrosis. --Left kidney/ureter: There hyperdense left renal cyst that appears stable since prior study in 2020. There is no hydronephrosis. --Urinary bladder: The bladder is decompressed which limits evaluation. Stomach/Bowel: --Stomach/Duodenum: No hiatal hernia or other gastric abnormality. Normal duodenal course and caliber. --Small bowel: No dilatation or inflammation. --Colon: No focal abnormality. --Appendix: Normal. Vascular/Lymphatic: Atherosclerotic calcification is present within the non-aneurysmal abdominal aorta, without hemodynamically significant stenosis. There appear to be bilateral occluded lower extremity dialysis grafts. There is a small fluid collection surrounding the right femoral vasculature measuring approximately 2.3 cm. This is favored to represent a postoperative seroma or hematoma. --No retroperitoneal lymphadenopathy. --No mesenteric lymphadenopathy. --No pelvic or inguinal lymphadenopathy. Reproductive: Multiple brachytherapy beads are noted in the  prostate gland. There is a fluid density mass in the right inguinal canal which is stable across prior studies. Other: No ascites or free air. The abdominal wall is normal. Musculoskeletal. There are end-stage degenerative changes of the left hip. There are moderate degenerative changes of the right hip. Review of the MIP images confirms the above findings. IMPRESSION: 1. No acute pulmonary embolism. 2. Significantly  dilated main pulmonary artery which can be seen in patients with pulmonary artery hypertension. 3. Small to moderate-sized bilateral pleural effusions with patchy bilateral ground-glass airspace opacities. This may be related to pulmonary edema or an atypical infectious process. 4. Ascending thoracic aortic aneurysm measuring approximately 4.6 cm. Ascending thoracic aortic aneurysm. Recommend semi-annual imaging followup by CTA or MRA and referral to cardiothoracic surgery if not already obtained. This recommendation follows 2010 ACCF/AHA/AATS/ACR/ASA/SCA/SCAI/SIR/STS/SVM Guidelines for the Diagnosis and Management of Patients With Thoracic Aortic Disease. Circulation. 2010; 121: Q259-D638. Aortic aneurysm NOS (ICD10-I71.9) 5. Bilateral occluded lower extremity dialysis grafts with small fluid collection surrounding the right femoral vasculature. This is favored to represent a postoperative seroma or hematoma. 6. Stable bilateral hyperdense renal cysts. 7. 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 Aortic Atherosclerosis (ICD10-I70.0). Electronically Signed   By: Constance Holster M.D.   On: 01/25/2020 02:42   MR BRAIN WO CONTRAST  Result Date: 01/08/2020 CLINICAL DATA:  79 year old male with left extremity weakness, generalized deconditioning. EXAM: MRI HEAD WITHOUT CONTRAST TECHNIQUE: Multiplanar, multiecho pulse sequences of the brain and surrounding structures were obtained without intravenous contrast. COMPARISON:  Brain MRI 11/11/2017.  Head CT 09/05/2019. FINDINGS: Brain: No restricted diffusion or evidence of acute infarction. Tiny chronic lacunar infarcts in the right cerebellum are stable. Mild for age T2 heterogeneity in the pons and more moderate for age bilateral cerebral white matter T2 and FLAIR hyperintensity. There is a small area of chronic cortical encephalomalacia in the left superior parietal lobe including some involvement of the  sensory strip (series 11, image 20). Stable cerebral volume since 2018. Mild nonspecific ventriculomegaly, stable, and the temporal horns remain relatively diminutive. No midline shift, mass effect, evidence of mass lesion, extra-axial collection or acute intracranial hemorrhage. Cervicomedullary junction and pituitary are within normal limits. The deep gray matter nuclei remain within normal limits. No new signal changes identified since 2018. Vascular: Major intracranial vascular flow voids are stable since 2018. Skull and upper cervical spine: Negative for age visible cervical spine. Visualized bone marrow signal is within normal limits. Sinuses/Orbits: Mucosal thickening stable and negative orbits. Mild paranasal is stable sinus. Other: Mastoid air cells remain clear. Visible internal auditory structures appear normal. IMPRESSION: 1. No acute intracranial abnormality. Stable MRI appearance of the brain since 2018. 2. Small chronic posterior left MCA territory infarct, and evidence of chronic small vessel disease in the cerebral white matter and cerebellum. 3. Mild ventriculomegaly is chronic and probably ex vacuo in nature. Electronically Signed   By: Genevie Ann M.D.   On: 01/08/2020 09:08   MR LUMBAR SPINE WO CONTRAST  Result Date: 01/08/2020 CLINICAL DATA:  Lumbar radiculopathy. EXAM: MRI LUMBAR SPINE WITHOUT CONTRAST TECHNIQUE: Multiplanar, multisequence MR imaging of the lumbar spine was performed. No intravenous contrast was administered. COMPARISON:  Correlation made with CT of the abdomen performed March 15, 2019 and Apr 30, 1999. FINDINGS: Segmentation:  Standard. Alignment:  Physiologic. Vertebrae: Radiation induced fatty marrow replacement is noted in the sacrum and L5. Focal lesions in the L4 and L5 vertebral bodies with predominantly  low signal on T1 and T2 and increased signal on STIR correspond to a sclerotic lesion seen on prior CT. Conus medullaris and cauda equina: Conus extends to the L1 level.  Conus and cauda equina appear normal. Paraspinal and other soft tissues: Negative. Disc levels: L1-2: Mild facet degenerative changes without spinal canal or neural foraminal stenosis. L2-3: Small symmetric disc bulge, facet degenerative changes and ligamentum flavum hypertrophy resulting in mild left neural foraminal stenosis. No significant neural. L3-4: Disc bulge with superimposed central disc protrusion, facet degenerative changes flavum resulting in mild neural. No significant spinal stenosis. L4-5: Disc bulge, facet degenerative resulting mild bilateral neural foraminal narrowing and mild spinal stenosis with obliteration of the subarticular zones. L5-S1: Facet degenerative changes without significant spinal canal or neural stenosis. IMPRESSION: 1. Mild degenerative changes of lumbar spine pronounced at L4-5 where there is mild spinal canal stenosis with obliteration the subarticular zones and mild bilateral neural foraminal stenosis. 2. Focal lesions in the L4 and L5 vertebral bodies appear stable in size since 2015. Electronically Signed   By: Pedro Earls M.D.   On: 01/08/2020 09:59   CT Abdomen Pelvis W Contrast  Result Date: 01/25/2020 CLINICAL DATA:  Left upper quadrant pain. Shortness of breath. EXAM: CT ANGIOGRAPHY CHEST CT ABDOMEN AND PELVIS WITH CONTRAST TECHNIQUE: Multidetector CT imaging of the chest was performed using the standard protocol during bolus administration of intravenous contrast. Multiplanar CT image reconstructions and MIPs were obtained to evaluate the vascular anatomy. Multidetector CT imaging of the abdomen and pelvis was performed using the standard protocol during bolus administration of intravenous contrast. CONTRAST:  195m OMNIPAQUE IOHEXOL 350 MG/ML SOLN COMPARISON:  01/14/2019. FINDINGS: CTA CHEST FINDINGS Cardiovascular: There is no acute pulmonary embolism. The main pulmonary artery is significantly dilated measuring approximately 4.9 cm in diameter.  There is a thoracic aortic aneurysm involving the ascending aorta currently measuring approximately 4.6 cm. There are minimal atherosclerotic changes of the thoracic aorta. There is cardiomegaly. There is no significant pericardial effusion. Coronary artery calcifications are noted. Is a well-positioned tunneled dialysis catheter with tip terminating near the cavoatrial junction. There is a small amount of adherent thrombus to the midportion of the catheter. Mediastinum/Nodes: --No mediastinal or hilar lymphadenopathy. --No axillary lymphadenopathy. --No supraclavicular lymphadenopathy. --Normal thyroid gland. --The esophagus is unremarkable Lungs/Pleura: There are small to moderate-sized bilateral pleural effusions. There are patchy bilateral ground-glass airspace opacities, greatest at the lung bases bilaterally. There is no pneumothorax. The trachea is unremarkable. Musculoskeletal: No chest wall abnormality. No acute or significant osseous findings. Review of the MIP images confirms the above findings. CT ABDOMEN and PELVIS FINDINGS Hepatobiliary: The liver is normal. Normal gallbladder.There is no biliary ductal dilation. Pancreas: Normal contours without ductal dilatation. No peripancreatic fluid collection. Spleen: No splenic laceration or hematoma. Adrenals/Urinary Tract: --Adrenal glands: No adrenal hemorrhage. --Right kidney/ureter: There multiple hyperdense right renal cysts that appear grossly stable since prior study. There is no hydronephrosis. --Left kidney/ureter: There hyperdense left renal cyst that appears stable since prior study in 2020. There is no hydronephrosis. --Urinary bladder: The bladder is decompressed which limits evaluation. Stomach/Bowel: --Stomach/Duodenum: No hiatal hernia or other gastric abnormality. Normal duodenal course and caliber. --Small bowel: No dilatation or inflammation. --Colon: No focal abnormality. --Appendix: Normal. Vascular/Lymphatic: Atherosclerotic calcification  is present within the non-aneurysmal abdominal aorta, without hemodynamically significant stenosis. There appear to be bilateral occluded lower extremity dialysis grafts. There is a small fluid collection surrounding the right femoral vasculature measuring approximately 2.3 cm. This  is favored to represent a postoperative seroma or hematoma. --No retroperitoneal lymphadenopathy. --No mesenteric lymphadenopathy. --No pelvic or inguinal lymphadenopathy. 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 There is a fluid density mass in the right inguinal canal which is stable across prior studies. Other: No ascites or free air. The abdominal wall is normal. Musculoskeletal. There are end-stage degenerative changes of the left hip. There are moderate degenerative changes of the right hip. Review of the MIP images confirms the above findings. IMPRESSION: 1. No acute pulmonary embolism. 2. Significantly dilated main pulmonary artery which can be seen in patients with pulmonary artery hypertension. 3. Small to moderate-sized bilateral pleural effusions with patchy bilateral ground-glass airspace opacities. This may be related to pulmonary edema or an atypical infectious process. 4. Ascending thoracic aortic aneurysm measuring approximately 4.6 cm. Ascending thoracic aortic aneurysm. Recommend semi-annual imaging followup by CTA or MRA and referral to cardiothoracic surgery if not already obtained. This recommendation follows 2010 ACCF/AHA/AATS/ACR/ASA/SCA/SCAI/SIR/STS/SVM Guidelines for the Diagnosis and Management of Patients With Thoracic Aortic Disease. Circulation. 2010; 121: B353-G992. Aortic aneurysm NOS (ICD10-I71.9) 5. Bilateral occluded lower extremity dialysis grafts with small fluid collection surrounding the right femoral vasculature. This is favored to represent a postoperative seroma or hematoma. 6. Stable bilateral hyperdense renal cysts. 7. End-stage degenerative changes of the  left hip and moderate degenerative changes of the right hip. Aortic Atherosclerosis (ICD10-I70.0). Electronically Signed   By: Constance Holster M.D.   On: 01/25/2020 02:42   IR US Guide Vasc Access Right  Result Date: 01/26/2020 INDICATION: 79 year old male with a history of thrombosed right femoral loop graft EXAM: ULTRASOUND GUIDED ACCESS RIGHT FEMORAL LOOP GRAFT X2 MECHANICAL AND PHARMACOLOGIC THROMBECTOMY MEDICATIONS: None. ANESTHESIA/SEDATION: Moderate Sedation Time:  23 minutes The patient was continuously monitored during the procedure by the interventional radiology nurse under my direct supervision. FLUOROSCOPY TIME:  Fluoroscopy Time: 4 minutes 12 seconds (94 mGy). COMPLICATIONS: None PROCEDURE: The procedure, risks, benefits, and alternatives were explained to the patient and the patient's family, including bleeding, infection, arterial thrombus, venous thrombus, vessel injury, need for further procedure, need for stenting, contrast reaction, need for catheter placement, cardiopulmonary collapse, death. Questions regarding the procedure were encouraged and answered. The patient understands and consents to the procedure. Ultrasound survey was performed with images stored and sent to PACs. The right femoral/inguinal region prepped and draped in the usual sterile fashion. Ultrasound guidance was used to access the graft with a micropuncture kit. Exchange was made for a 7 [place] short sheath. A Bentson was then a navigate into the venous outflow, with an angled catheter advanced into the venous outflow. Venogram of the right iliac venous system demonstrates patency of the outflow. Upon withdrawal of the angled catheter, a solution of 3 milligrams of tPA in saline was infused into the outflow portion of the graft. A 6 mm balloon catheter was used to macerate the thrombosed segment. Balloon angioplasty of the venous outflow was performed with 6 mm. Ultrasound guidance was then used access the graft  directed toward the arterial anastomosis. Once the micro sheath was in place, an exchange was made for a 6 [place] short sheath. Glidewire was navigated across the arterial anastomosis, with an over the wire Fogarty balloon advanced into the artery. The arterial plug was pulled, and then a angiogram demonstrated a flow. Balloon catheter was advanced to the iliac artery and repeat angiogram was performed. No stenosis was identified with multiple obliquity. Repeat fistulogram was then performed. Repeat angioplasty of the venous  outflow at the venous anastomosis performed with 8 mm balloon angioplasty. No waist identified. Catheters and wires were removed with a complete fistulagram performed through the central vasculature. No residual stenosis was identified in the venous outflow. No residual stenosis at the arterial anastomosis. The short sheaths were removed after placement of hemostasis sutures. Patient tolerated the procedure well and remained hemodynamically stable throughout. No complications were encountered and no significant blood loss was encountered. IMPRESSION: Status post ultrasound-guided access of right femoral loop graft for mechanical and pharmacologic thrombectomy and restoration of flow, with no venous outflow stenosis and no arterial stenosis identified. Signed, Dulcy Fanny. Dellia Nims, RPVI [name] Radiology ACCESS: Early referral for duplex exam recommended to measure flow volumes, and evaluate for any possible occult stenosis by angiogram. Access amenable to usage and further intervention. Electronically Signed   By: Corrie Mckusick D.O.   On: 01/26/2020 15:55   DG Chest Port 1 View  Result Date: 01/24/2020 CLINICAL DATA:  Left-sided chest pain EXAM: PORTABLE CHEST 1 VIEW COMPARISON:  12/24/2019 FINDINGS: Airspace disease symmetrically at the bases with a few Kerley lines. No visible effusion. Perma catheter on the right with tip in good  position. Cardiomegaly. No pneumothorax IMPRESSION: 1. Symmetric pulmonary opacity favoring edema. 2. More prominent cardiomegaly than on recent comparison. Electronically Signed   By: Monte Fantasia M.D.   On: 01/24/2020 07:43   IR THROMBECTOMY AV FISTULA/W THROMBOLYSIS/PTA INC SHUNT/IMG RIGHT  Result Date: 01/26/2020 INDICATION: 79 year old male with a history of thrombosed right femoral loop graft EXAM: ULTRASOUND GUIDED ACCESS RIGHT FEMORAL LOOP GRAFT X2 MECHANICAL AND PHARMACOLOGIC THROMBECTOMY MEDICATIONS: None. ANESTHESIA/SEDATION: Moderate Sedation Time:  23 minutes The patient was continuously monitored during the procedure by the interventional radiology nurse under my direct supervision. FLUOROSCOPY TIME:  Fluoroscopy Time: 4 minutes 12 seconds (94 mGy). COMPLICATIONS: None PROCEDURE: The procedure, risks, benefits, and alternatives were explained to the patient and the patient's family, including bleeding, infection, arterial thrombus, venous thrombus, vessel injury, need for further procedure, need for stenting, contrast reaction, need for catheter placement, cardiopulmonary collapse, death. Questions regarding the procedure were encouraged and answered. The patient understands and consents to the procedure. Ultrasound survey was performed with images stored and sent to PACs. The right femoral/inguinal region prepped and draped in the usual sterile fashion. Ultrasound guidance was used to access the graft with a micropuncture kit. Exchange was made for a 7 [place] short sheath. A Bentson was then a navigate into the venous outflow, with an angled catheter advanced into the venous outflow. Venogram of the right iliac venous system demonstrates patency of the outflow. Upon withdrawal of the angled catheter, a solution of 3 milligrams of tPA in saline was infused into the outflow portion of the graft. A 6 mm balloon catheter was used to macerate the thrombosed segment. Balloon angioplasty of the venous  outflow was performed with 6 mm. Ultrasound guidance was then used access the graft directed toward the arterial anastomosis. Once the micro sheath was in place, an exchange was made for a 6 [place] short sheath. Glidewire was navigated across the arterial anastomosis, with an over the wire Fogarty balloon advanced into the artery. The arterial plug was pulled, and then a angiogram demonstrated a flow. Balloon catheter was advanced to the iliac artery and repeat angiogram was performed. No stenosis was identified with multiple obliquity. Repeat fistulogram was then performed. Repeat angioplasty of the venous outflow at the venous anastomosis performed with 8 mm balloon angioplasty.  No waist identified. Catheters and wires were removed with a complete fistulagram performed through the central vasculature. No residual stenosis was identified in the venous outflow. No residual stenosis at the arterial anastomosis. The short sheaths were removed after placement of hemostasis sutures. Patient tolerated the procedure well and remained hemodynamically stable throughout. No complications were encountered and no significant blood loss was encountered. IMPRESSION: Status post ultrasound-guided access of right femoral loop graft for mechanical and pharmacologic thrombectomy and restoration of flow, with no venous outflow stenosis and no arterial stenosis identified. Signed, Dulcy Fanny. Dellia Nims, RPVI [facility] Radiology ACCESS: Early referral for duplex exam recommended to measure flow volumes, and evaluate for any possible occult stenosis by angiogram. Access amenable to usage and further intervention. Electronically Signed   By: Corrie Mckusick D.O.   On: 01/26/2020 15:55    Microbiology: Recent Results (from the past 240 hour(s))  MRSA PCR Screening     Status: None   Collection Time: 01/25/20  6:45 PM   Specimen: Nasal Mucosa; Nasopharyngeal  Result Value Ref Range  Status   MRSA by PCR NEGATIVE NEGATIVE Final    Comment:        The GeneXpert MRSA Assay (FDA approved for NASAL specimens only), is one component of a comprehensive MRSA colonization surveillance program. It is not intended to diagnose MRSA infection nor to guide or monitor treatment for MRSA infections. Performed at [facility], [name] [address], [place], [place] [address]      Labs: Basic Metabolic Panel: Recent Labs  Lab 01/24/20 5681 01/25/20 0600 01/26/20 0338 01/27/20 0334  NA 142 140 138 136  K 3.6 4.6 4.1 3.9  CL 100 101 99 97*  CO2 29 27 27 25   GLUCOSE 119* 99 95 84  BUN 20 15 25* 15  CREATININE 8.57* 5.97* 7.38* 5.25*  CALCIUM 9.6 9.0 9.0 8.9   Liver Function Tests: Recent Labs  Lab 01/24/20 0712 01/25/20 0600  AST 19 16  ALT 16 16  ALKPHOS 149* 129*  BILITOT 0.6 0.5  PROT 6.1* 5.8*  ALBUMIN 2.9* 2.7*   Recent Labs  Lab 01/24/20 0712  LIPASE 33   No results for input(s): AMMONIA in the last 168 hours. CBC: Recent Labs  Lab 01/24/20 0712 01/25/20 0600 01/26/20 0338 01/27/20 0334  WBC 7.5 8.0 7.9 7.1  NEUTROABS 5.2  --   --   --   HGB 9.3* 9.3* 9.1* 9.2*  HCT 30.4* 30.6* 29.8* 30.5*  MCV 107.0* 108.1* 106.8* 106.3*  PLT 293 283 313 239   Cardiac Enzymes: No results for input(s): CKTOTAL, CKMB, CKMBINDEX, TROPONINI in the last 168 hours. BNP: BNP (last 3 results) Recent Labs    12/11/19 1331 01/08/20 1032 01/09/20 0222  BNP 632.3* 890.8* 641.5*    ProBNP (last 3 results) No results for input(s): PROBNP in the last 8760 hours.  CBG: No results for input(s): GLUCAP in the last 168 hours.     Signed:  Domenic Polite MD.  [facility] 01/27/2020, 10:29 AM

## 2020-01-27 NOTE — Progress Notes (Signed)
[facility] for Heparin, Coumadin Indication: atrial fibrillation  Allergies  Allergen Reactions  . Dextromethorphan-Guaifenesin Other (See Comments)    Unknown reaction  . Tocotrienols Other (See Comments)    Unknown reaction  . Losartan Potassium Other (See Comments)    Causes constipation    Patient Measurements: Height: 6\' 2"  (188 cm) Weight: 166 lb 7.2 oz (75.5 kg) IBW/kg (Calculated) : 82.2  Vital Signs: Temp: 98 F (36.7 C) (02/23 0458) Temp Source: Oral (02/23 0458) BP: 116/63 (02/23 0458) Pulse Rate: 69 (02/23 0458)  Labs: Recent Labs    01/24/20 0712 01/24/20 0712 01/24/20 0910 01/24/20 1907 01/25/20 0600 01/25/20 0600 01/25/20 1003 01/25/20 1403 01/26/20 0338 01/27/20 0334  HGB 9.3*   < >  --   --  9.3*   < >  --   --  9.1* 9.2*  HCT 30.4*   < >  --   --  30.6*  --   --   --  29.8* 30.5*  PLT 293   < >  --   --  283  --   --   --  313 239  LABPROT 15.5*   < >  --   --  17.0*  --   --   --  19.0* 17.8*  INR 1.2   < >  --   --  1.4*  --   --   --  1.6* 1.5*  HEPARINUNFRC  --    < >  --  1.02* 0.26*  --    < > 0.24* 0.33 0.23*  CREATININE 8.57*   < >  --   --  5.97*  --   --   --  7.38* 5.25*  TROPONINIHS 55*  --  70* 110*  --   --   --   --   --   --    < > = values in this interval not displayed.    Estimated Creatinine Clearance: 12.6 mL/min (A) (by C-G formula based on SCr of 5.25 mg/dL (H)).  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 On warfarin PTA for afib.  Heparin level down to subtherapeutic (0.23) on gtt at 950 units/hr. No issues with line or bleeding reported per RN.  Sp IR 2/22 for [place] loop R femoral graft/thrombectomy.   Goal of Therapy:  Heparin level 0.3-0.7 units/ml  INR 2-3 Monitor platelets by anticoagulation protocol: Yes   Plan:  Increase heparin to 1100 units/hr. Will f/u 8h heparin level  Sherlon Handing, PharmD, BCPS Please see amion for complete clinical pharmacist phone  list 01/27/2020 6:15 AM

## 2020-01-28 DIAGNOSIS — U071 COVID-19: Secondary | ICD-10-CM | POA: Diagnosis not present

## 2020-01-28 DIAGNOSIS — E46 Unspecified protein-calorie malnutrition: Secondary | ICD-10-CM | POA: Diagnosis not present

## 2020-01-28 DIAGNOSIS — I480 Paroxysmal atrial fibrillation: Secondary | ICD-10-CM | POA: Diagnosis not present

## 2020-01-28 DIAGNOSIS — D696 Thrombocytopenia, unspecified: Secondary | ICD-10-CM | POA: Diagnosis not present

## 2020-01-28 DIAGNOSIS — I509 Heart failure, unspecified: Secondary | ICD-10-CM | POA: Diagnosis not present

## 2020-01-28 DIAGNOSIS — B20 Human immunodeficiency virus [HIV] disease: Secondary | ICD-10-CM | POA: Diagnosis not present

## 2020-01-28 DIAGNOSIS — N186 End stage renal disease: Secondary | ICD-10-CM | POA: Diagnosis not present

## 2020-01-29 DIAGNOSIS — N186 End stage renal disease: Secondary | ICD-10-CM | POA: Diagnosis not present

## 2020-01-29 DIAGNOSIS — N2581 Secondary hyperparathyroidism of renal origin: Secondary | ICD-10-CM | POA: Diagnosis not present

## 2020-01-29 DIAGNOSIS — E8770 Fluid overload, unspecified: Secondary | ICD-10-CM | POA: Diagnosis not present

## 2020-01-29 DIAGNOSIS — Z992 Dependence on renal dialysis: Secondary | ICD-10-CM | POA: Diagnosis not present

## 2020-01-29 LAB — PROTIME-INR: INR: 1.5 — AB (ref 0.9–1.1)

## 2020-01-29 LAB — POCT INR: INR: 1.5 — AB (ref 2.0–3.0)

## 2020-01-31 DIAGNOSIS — Z992 Dependence on renal dialysis: Secondary | ICD-10-CM | POA: Diagnosis not present

## 2020-01-31 DIAGNOSIS — E8770 Fluid overload, unspecified: Secondary | ICD-10-CM | POA: Diagnosis not present

## 2020-01-31 DIAGNOSIS — N186 End stage renal disease: Secondary | ICD-10-CM | POA: Diagnosis not present

## 2020-01-31 DIAGNOSIS — N2581 Secondary hyperparathyroidism of renal origin: Secondary | ICD-10-CM | POA: Diagnosis not present

## 2020-02-01 DIAGNOSIS — B20 Human immunodeficiency virus [HIV] disease: Secondary | ICD-10-CM | POA: Diagnosis not present

## 2020-02-01 DIAGNOSIS — Z992 Dependence on renal dialysis: Secondary | ICD-10-CM | POA: Diagnosis not present

## 2020-02-01 DIAGNOSIS — N186 End stage renal disease: Secondary | ICD-10-CM | POA: Diagnosis not present

## 2020-02-02 ENCOUNTER — Encounter: Payer: Self-pay | Admitting: Infectious Disease

## 2020-02-02 ENCOUNTER — Encounter: Payer: Self-pay | Admitting: Cardiovascular Disease

## 2020-02-02 ENCOUNTER — Ambulatory Visit (INDEPENDENT_AMBULATORY_CARE_PROVIDER_SITE_OTHER): Payer: Medicare HMO | Admitting: Infectious Disease

## 2020-02-02 ENCOUNTER — Other Ambulatory Visit: Payer: Self-pay

## 2020-02-02 VITALS — BP 121/72 | HR 80 | Ht 74.0 in | Wt 171.0 lb

## 2020-02-02 DIAGNOSIS — C61 Malignant neoplasm of prostate: Secondary | ICD-10-CM

## 2020-02-02 DIAGNOSIS — I5042 Chronic combined systolic (congestive) and diastolic (congestive) heart failure: Secondary | ICD-10-CM | POA: Diagnosis not present

## 2020-02-02 DIAGNOSIS — I482 Chronic atrial fibrillation, unspecified: Secondary | ICD-10-CM

## 2020-02-02 DIAGNOSIS — T82858A Stenosis of vascular prosthetic devices, implants and grafts, initial encounter: Secondary | ICD-10-CM | POA: Diagnosis not present

## 2020-02-02 DIAGNOSIS — Z7189 Other specified counseling: Secondary | ICD-10-CM

## 2020-02-02 DIAGNOSIS — T82868A Thrombosis of vascular prosthetic devices, implants and grafts, initial encounter: Secondary | ICD-10-CM | POA: Diagnosis not present

## 2020-02-02 DIAGNOSIS — Z992 Dependence on renal dialysis: Secondary | ICD-10-CM

## 2020-02-02 DIAGNOSIS — N186 End stage renal disease: Secondary | ICD-10-CM | POA: Diagnosis not present

## 2020-02-02 DIAGNOSIS — I132 Hypertensive heart and chronic kidney disease with heart failure and with stage 5 chronic kidney disease, or end stage renal disease: Secondary | ICD-10-CM

## 2020-02-02 DIAGNOSIS — A429 Actinomycosis, unspecified: Secondary | ICD-10-CM

## 2020-02-02 DIAGNOSIS — U071 COVID-19: Secondary | ICD-10-CM | POA: Diagnosis not present

## 2020-02-02 DIAGNOSIS — I447 Left bundle-branch block, unspecified: Secondary | ICD-10-CM

## 2020-02-02 DIAGNOSIS — B20 Human immunodeficiency virus [HIV] disease: Secondary | ICD-10-CM

## 2020-02-02 DIAGNOSIS — Z515 Encounter for palliative care: Secondary | ICD-10-CM

## 2020-02-02 DIAGNOSIS — A529 Late syphilis, unspecified: Secondary | ICD-10-CM

## 2020-02-02 NOTE — Progress Notes (Signed)
This encounter was created in error - please disregard.

## 2020-02-02 NOTE — Patient Instructions (Signed)
Patient expressed wish to me that he is DOES NOT WANT TO BE A DNR  He tells me he wants to be a FULL CODE  We will remove the DNR from his wrist and ask him to discuss further with his primary physicians at SN[facility]  He seems to have capacity to me

## 2020-02-02 NOTE — Progress Notes (Signed)
Chief complaint: Follow-up for HIV disease  Subjective:    Patient ID: David [name], male    DOB: 1941-06-26, 79 y.o.   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   Blood cultures were drawn on admission when he did have leukocytosis as well he was treated with antibiotics while in-house but then these were discontinued.  His blood cultures have subsequently shown 1 out of 2 sites to grow and actinomyces species.  He did have actinomyces in a blood culture and was treated for that as well the past year.  He has had multiple hospitalizations due to respiratory failure.  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  This January he succumbed to severe COVID-19 pneumonia had a prolonged hospitalization for that.  He then ultimately was readmitted acute pulmonary edema in the context of volume overload.   In examining the patient today I noticed he was wearing a wrist band that had a DO NOT RESUSCITATE sign on it.  When I discussed with Jayan what DO NOT RESUSCITATE meant he was surprised and told me " does not what I want, I want to come back I want to be resuscitated!"  In looking through the chart it appears that he was made a DO NOT RESUSCITATE during his hospitalization in February after consultation with palliative care in early January.  The decision to be made a DNR was made apparently while the patient was undergoing hemodialysis and on the phone with his wife and with palliative care.  However in talking to him today he seemed to have no knowledge of that decision and was quite clear with me that he would want to be resuscitated if he had a  cardiac arrest.  Therefore I removed his DNR on his wrist and asked him to inform the skilled nursing facility staff and I have left a note on my discharge paperwork for them as well that he wishes to be a full code.         Past Medical History:  Diagnosis Date  . 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 Acute on chronic systolic and diastolic heart failure, NYHA class 4 ([place])   . Anemia, iron deficiency 11/15/2011  . Arthritis    "hands, right knee, feet" (02/21/2018)  . Atrial fibrillation ([place])   . Cancer [facility])    hx of prostate; s/p radioactive seed implant 10/2009 Dr Janice Norrie  . Cardiac arrest ([place]) 02/17/2018  . CHF (congestive heart failure) ([place])   . Chronic combined systolic and diastolic heart failure, NYHA class 3 ([place]) 06/2010   felt to be secondary to hypertensive cardiomyopathy  . Chronic lower back pain   . CKD (chronic kidney disease) stage V requiring chronic dialysis ([place])   . ESRD on dialysis [facility])    started 02/2018 [place]  . GERD (gastroesophageal reflux disease)   . Glaucoma    "I had surgery and dont have it any more"  . Gout    daily RX (02/21/2018)  . Heart murmur    "mild" per pt  . Hepatitis    years ago  . HIV (human immunodeficiency virus infection) ([place])   . HIV infection ([place])   .  Hyperlipidemia   . Hypertension    followed by [facility] and Vascular (Dr Dani Gobble Croitoru)  . Hypertension   . Nonischemic cardiomyopathy ([place])   . Pneumonia 11/2017  . Prostate cancer ([place])   . Sinus bradycardia   . Sleep apnea    does not use a cpap  . Stroke [facility])    "mini stroke" years ago  . Wears glasses     Past Surgical History:  Procedure Laterality Date  . AV FISTULA PLACEMENT Right 10/13/2016   Procedure: ARTERIOVENOUS (AV) FISTULA CREATION;  Surgeon: [name], MD;  Location: [place];  Service: Vascular;  Laterality: Right;  . AV FISTULA PLACEMENT Left 02/25/2018   Procedure: INSERTION OF ARTERIOVENOUS (AV)  GORE-TEX GRAFT LEFT UPPER ARM;  Surgeon: Angelia Mould, MD;  Location: [place];  Service: Vascular;  Laterality: Left;  . AV FISTULA PLACEMENT Right 08/07/2018   Procedure: Creation of right arm brachiocephalic Fistula;  Surgeon: Waynetta Sandy, MD;  Location: [place];  Service: Vascular;  Laterality: Right;  . AV FISTULA PLACEMENT Left 11/10/2019   Procedure: INSERTION OF ARTERIOVENOUS (AV) GORE-TEX GRAFT THIGH;  Surgeon: Elam Dutch, MD;  Location: [place];  Service: Vascular;  Laterality: Left;  . AV FISTULA PLACEMENT Right 12/02/2019   Procedure: INSERTION OF ARTERIOVENOUS (AV) GORE-TEX GRAFT RIGHT  THIGH;  Surgeon: Waynetta Sandy, MD;  Location: [place];  Service: Vascular;  Laterality: Right;  . BASCILIC VEIN TRANSPOSITION Left 06/24/2015   Procedure: BASILIC VEIN TRANSPOSITION;  Surgeon: Mal Misty, MD;  Location: [place];  Service: Vascular;  Laterality: Left;  . BIOPSY  03/14/2019   Procedure: BIOPSY;  Surgeon: Irene Shipper, MD;  Location: [place];  Service: Endoscopy;;  . CATARACT EXTRACTION, BILATERAL Bilateral   . COLONOSCOPY WITH PROPOFOL N/A 03/14/2019   Procedure: COLONOSCOPY WITH PROPOFOL;  Surgeon: Irene Shipper, MD;  Location: [facility] ENDOSCOPY;  Service: Endoscopy;  Laterality: N/A;  . ESOPHAGOGASTRODUODENOSCOPY (EGD) WITH PROPOFOL N/A 05/05/2014   Procedure: ESOPHAGOGASTRODUODENOSCOPY (EGD) WITH PROPOFOL;  Surgeon: Missy Sabins, MD;  Location: [facility] ENDOSCOPY;  Service: Endoscopy;  Laterality: N/A;  . EYE SURGERY Bilateral    cataract surgery   . GLAUCOMA SURGERY Bilateral   . INSERTION OF ARTERIOVENOUS (AV) ARTEGRAFT ARM  10/09/2018   Procedure: INSERTION OF ARTERIOVENOUS (AV) 73mm x 41cm ARTEGRAFT LEFT UPPER ARM;  Surgeon: Waynetta Sandy, MD;  Location: [place];  Service: Vascular;;  . INSERTION OF DIALYSIS CATHETER Right 07/14/2019   Procedure: Insertion Of Dialysis Catheter;  Surgeon: Elam Dutch, MD;  Location: [facility] OR;  Service: Vascular;   Laterality: Right;  placed right Internal Jugular  . INSERTION PROSTATE RADIATION SEED    . IR FLUORO GUIDE CV LINE RIGHT  02/18/2018  . IR REMOVAL TUN CV CATH W/O FL  12/06/2018  . IR THROMBECTOMY AV FISTULA W/THROMBOLYSIS/PTA INC/SHUNT/IMG RIGHT Right 01/26/2020  . IR US GUIDE VASC ACCESS RIGHT  02/18/2018  . IR US GUIDE VASC ACCESS RIGHT  01/26/2020  . LEFT HEART CATH AND CORONARY ANGIOGRAPHY N/A 02/20/2018   Procedure: LEFT HEART CATH AND CORONARY ANGIOGRAPHY;  Surgeon: Jettie Booze, MD;  Location: [place] CV LAB;;; 50% ostial OM2, 25% mLAD.  Marland Kitchen LEFT HEART CATH AND CORONARY ANGIOGRAPHY  2004   mildly depressed LV systolic fx EF 81%,EXNTZG coronaries/abdominal aorta/renal arteries.  . LOWER EXTREMITY ANGIOGRAM Left 11/17/2019   Procedure: Lower Extremity Fistulogram;  Surgeon: Marty Heck, MD;  Location: [place];  Service: Vascular;  Laterality: Left;  . NM MYOCAR PERF WALL MOTION  02/21/2010   No ischemia or infarction.  EF 27%.  Marland Kitchen RADIOACTIVE SEED IMPLANT  2010   prostate cancer  . REVISION OF ARTERIOVENOUS GORETEX GRAFT Right 07/11/2019   Procedure: REVISION OF ARTERIOVENOUS GORETEX GRAFT RIGHT ARM WITH ARTEGRAFT;  Surgeon: Serafina Mitchell, MD;  Location: [place];  Service: Vascular;  Laterality: Right;  . SHUNTOGRAM Right 07/14/2019   Procedure: Shuntogram;  Surgeon: Elam Dutch, MD;  Location: [place];  Service: Vascular;  Laterality: Right;  . THROMBECTOMY AND REVISION OF ARTERIOVENTOUS (AV) GORETEX  GRAFT Right 07/14/2019   Procedure: THROMBECTOMY AND REVISION OF ARTERIOVENTOUS (AV) GORETEX  GRAFT;  Surgeon: Elam Dutch, MD;  Location: [place];  Service: Vascular;  Laterality: Right;  . THROMBECTOMY W/ EMBOLECTOMY Left 02/26/2018   Procedure: THROMBECTOMY ARTERIOVENOUS GRAFT;  Surgeon: Angelia Mould, MD;  Location: [place];  Service: Vascular;  Laterality: Left;  . THROMBECTOMY W/ EMBOLECTOMY Left 11/17/2019   Procedure: THROMBECTOMY ARTERIOVENOUS GRAFT LEFT  THIGH;  Surgeon: Marty Heck, MD;  Location: [place];  Service: Vascular;  Laterality: Left;  . TRANSTHORACIC ECHOCARDIOGRAM  02/2012   [facility]) EF 45-50% with mild global HK.  Mild LVH,LA mod. dilated,mild-mod. 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 AI.  Marland Kitchen TRANSTHORACIC ECHOCARDIOGRAM  9/'18; 3/'19   a) Severely reduced EF.  25 from 30%.  GR 1 DD with diffuse ecchymosis. Mild AS & AI.  Mild LA/RA dilation; b) (post PEA arest):   Moderately dilated LV with moderate concentric virtually.  EF 15 to 20%.  GR 1 DD.  Paradoxical septal motion. Mild AI.  Mild LA dilation.  Poorly visualized RV.  Marland Kitchen TRANSTHORACIC ECHOCARDIOGRAM  02/2019   EF 20-25%.  Unable to assess diastolic function (A. Fib).  No LV apical thrombus.  Mild AI.  Mildly reduced RV function, however poorly visualized.  Marland Kitchen ULTRASOUND GUIDANCE FOR VASCULAR ACCESS  02/20/2018   Procedure: Ultrasound Guidance For Vascular Access;  Surgeon: Jettie Booze, MD;  Location: [place] CV LAB;  Service: Cardiovascular;;  . UPPER EXTREMITY VENOGRAPHY N/A 07/08/2018   Procedure: UPPER EXTREMITY VENOGRAPHY;  Surgeon: Waynetta Sandy, MD;  Location: [place] CV LAB;  Service: Cardiovascular;  Laterality: N/A;  Bilateral    Family History  Problem Relation Age of Onset  . Hypertension Mother   . Thyroid disease Mother   . Cholelithiasis Daughter   . Cholelithiasis Son   . Hypertension Maternal Grandmother   . Diabetes Maternal Grandmother   . Heart attack Neg Hx   . Hyperlipidemia Neg Hx       Social History   Socioeconomic History  . Marital status: Married    Spouse name: Not on file  . Number of children: 2  . Years of education: 44  . Highest education level: Not on file  Occupational History  . 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 Smoking status: Former Smoker    Packs/day: 1.00    Years: 30.00    Pack years: 30.00    Types: Cigarettes      Quit date: 2006    Years since quitting: 15.1  . Smokeless tobacco: Never Used  Substance and Sexual Activity  . Alcohol use: Yes    Alcohol/week: 1.0 standard drinks    Types: 1 Shots of liquor per week    Comment: occasional  . Drug use: Never  .  Sexual activity: Not Currently    Comment: declined condoms 09/2019  Other Topics Concern  . Not on file  Social History Narrative   ** Merged History Encounter **       Stays active at home Regular exercise: no Drinks 2 cups of coffee a week, 1 mountain dew soda a day.   Social Determinants of Health   Financial Resource Strain:   . Difficulty of Paying Living Expenses: Not on file  Food Insecurity: No Food Insecurity  . Worried About Charity fundraiser in the Last Year: Never true  . Ran Out of Food in the Last Year: Never true  Transportation Needs: No Transportation Needs  . Lack of Transportation (Medical): No  . Lack of Transportation (Non-Medical): No  Physical Activity:   . Days of Exercise per Week: Not on file  . Minutes of Exercise per Session: Not on file  Stress:   . Feeling of Stress : Not on file  Social Connections:   . Frequency of Communication with Friends and Family: Not on file  . Frequency of Social Gatherings with Friends and Family: Not on file  . Attends Religious Services: Not on file  . Active Member of Clubs or Organizations: Not on file  . Attends Archivist Meetings: Not on file  . Marital Status: Not on file    Allergies  Allergen Reactions  . Dextromethorphan-Guaifenesin Other (See Comments)    Unknown reaction  . Tocotrienols Other (See Comments)    Unknown reaction  . Losartan Potassium Other (See Comments)    Causes constipation     Current Outpatient Medications:  .  acetaminophen (TYLENOL) 500 MG tablet, Take 1,000 mg by mouth every 6 (six) hours as needed for moderate pain., Disp: , Rfl:  .  allopurinol (ZYLOPRIM) 100 MG tablet, 1 tablet by mouth 3x weekly after  dialysis. (Patient taking differently: Take 100 mg by mouth See admin instructions. Take one tablet (100 mg) by mouth Tuesday, Thursday, Saturday after dialysis), Disp: 90 tablet, Rfl: 1 .  amiodarone (PACERONE) 200 MG tablet, Take 1 tablet (200 mg total) by mouth daily. (Patient taking differently: Take 200 mg by mouth daily at 12 noon. ), Disp: 90 tablet, Rfl: 3 .  bictegravir-emtricitabine-tenofovir AF ([name]) 50-200-25 MG TABS tablet, Take 1 tablet by mouth daily. (Patient taking differently: Take 1 tablet by mouth daily at 12 noon. ), Disp: 30 tablet, Rfl: 5 .  brimonidine (ALPHAGAN) 0.15 % ophthalmic solution, Place 1 drop into the left eye 2 (two) times daily., Disp: , Rfl: 3 .  dorzolamide-timolol (COSOPT) 22.3-6.8 MG/ML ophthalmic solution, Place 1 drop into the left eye 2 (two) times daily., Disp: , Rfl:  .  latanoprost (XALATAN) 0.005 % ophthalmic solution, Place 1 drop into the left eye at bedtime. , Disp: , Rfl:  .  loratadine (CLARITIN) 10 MG tablet, Take 10 mg by mouth daily at 12 noon. , Disp: , Rfl:  .  midodrine (PROAMATINE) 10 MG tablet, Take 1 tablet (10 mg total) by mouth as directed. Twice a day on the day of HD Tuesday, Thursday, Saturday (1 in AM and 1 at [place]). Take 1/2 tablet (5mg ) a day on non HD days (Patient taking differently: Take 10 mg by mouth See admin instructions. Take one tablet (10 mg) by mouth twice daily - 6am and 12pm -  on dialysis days - Tuesday, Thursday, Saturday), Disp: 90 tablet, Rfl: 5 .  midodrine (PROAMATINE) 5 MG tablet, Take 5 mg by  mouth See admin instructions. Take one tablet (5 mg) by mouth at 9 am on non-dialysis days - Sunday, Monday, Wednesday, Friday, Disp: , Rfl:  .  montelukast (SINGULAIR) 10 MG tablet, Take 1 tablet (10 mg total) by mouth at bedtime., Disp: 30 tablet, Rfl: 3 .  Multiple Vitamin (MULTIVITAMIN WITH MINERALS) TABS tablet, Take 1 tablet by mouth daily at 2 PM., Disp: , Rfl:  .  polyethylene glycol (MIRALAX / GLYCOLAX) 17 g  packet, Take 17 g by mouth every evening., Disp: , Rfl:  .  sucroferric oxyhydroxide (VELPHORO) 500 MG chewable tablet, Chew 500 mg by mouth 3 (three) times daily with meals., Disp: , Rfl:  .  traMADol (ULTRAM) 50 MG tablet, Take 1 tablet (50 mg total) by mouth every 6 (six) hours as needed., Disp: 5 tablet, Rfl: 0 .  warfarin (COUMADIN) 5 MG tablet, Take 5mg  daily for next 3days and then check INR 2/25, goal INR 2-3, Disp: , Rfl: 0   Review of Systems  Constitutional: Positive for fatigue. Negative for activity change, appetite change, chills, fever and unexpected weight change.  HENT: Positive for postnasal drip. Negative for congestion, rhinorrhea, sinus pressure, sneezing, sore throat and trouble swallowing.   Respiratory: Negative for cough.   Cardiovascular: Negative for chest pain, palpitations and leg swelling.  Gastrointestinal: Negative for abdominal distention, abdominal pain, constipation, nausea and vomiting.  Genitourinary: Negative for hematuria.  Musculoskeletal: Negative for myalgias.  Skin: Negative for color change, pallor, rash and wound.  Neurological: Negative for dizziness, tremors, weakness and light-headedness.  Hematological: Negative for adenopathy. Does not bruise/bleed easily.  Psychiatric/Behavioral: Negative for agitation, behavioral problems, confusion, decreased concentration and sleep disturbance.       Objective:   Physical Exam  Constitutional: He is oriented to person, place, and time. He appears well-developed and well-nourished. No distress.  HENT:  Head: Normocephalic and atraumatic.  Mouth/Throat: Oropharynx is clear and moist. Abnormal dentition. Dental caries present. No oropharyngeal exudate.  Eyes: Conjunctivae and EOM are normal. No scleral icterus.  Cardiovascular: Normal rate, regular rhythm and normal heart sounds. Exam reveals no gallop and no friction rub.  No murmur heard. Pulmonary/Chest: Effort normal and breath sounds normal. No  respiratory distress.  Abdominal: Soft. Bowel sounds are normal. He exhibits no distension. There is no abdominal tenderness. There is no rebound.  Musculoskeletal:        General: No tenderness or edema.     Cervical back: Normal range of motion and neck supple.  Neurological: He is alert and oriented to person, place, and time. He has normal reflexes. He exhibits normal muscle tone. Coordination normal.  Skin: Skin is warm and dry. He is not diaphoretic. No erythema. No pallor.  Psychiatric: He has a normal mood and affect. His behavior is normal. Judgment and thought content normal. His speech is delayed.          Assessment & Plan:  Goals of care:  The patient was quite clear with me that he wishes to be a full code.  I am DC his DNR bracelet and DNR status should be DC in SNF  He appears to have capacity and was the person who made the decision before but he does not remember this, undoubtedly because he was critically ill at the time.   HIV Continue [name] and come back in July for repeat labs and renewal of SPAP   CKD: followed by Dr Posey Pronto now on hemodialysis  HTN: Followed PCP  State cancer status post  implanted radioactive beads  There were no vitals filed for this visit.   NICM: With atrial fibrillation cardiology following closely  I spent greater than 40 minutes with the patient including greater than 50% of time in face to face counsel of the patient during the meaning of CODE STATUS what a full code means what a DO NOT RESUSCITATE means and coordination of his care.Marland Kitchen

## 2020-02-03 ENCOUNTER — Encounter: Payer: Self-pay | Admitting: Family

## 2020-02-03 DIAGNOSIS — N2581 Secondary hyperparathyroidism of renal origin: Secondary | ICD-10-CM | POA: Diagnosis not present

## 2020-02-03 DIAGNOSIS — N186 End stage renal disease: Secondary | ICD-10-CM | POA: Diagnosis not present

## 2020-02-03 DIAGNOSIS — Z992 Dependence on renal dialysis: Secondary | ICD-10-CM | POA: Diagnosis not present

## 2020-02-03 DIAGNOSIS — E8770 Fluid overload, unspecified: Secondary | ICD-10-CM | POA: Diagnosis not present

## 2020-02-03 DIAGNOSIS — Z789 Other specified health status: Secondary | ICD-10-CM | POA: Insufficient documentation

## 2020-02-05 ENCOUNTER — Other Ambulatory Visit: Payer: Self-pay | Admitting: *Deleted

## 2020-02-05 DIAGNOSIS — N186 End stage renal disease: Secondary | ICD-10-CM | POA: Diagnosis not present

## 2020-02-05 DIAGNOSIS — N2581 Secondary hyperparathyroidism of renal origin: Secondary | ICD-10-CM | POA: Diagnosis not present

## 2020-02-05 DIAGNOSIS — E8770 Fluid overload, unspecified: Secondary | ICD-10-CM | POA: Diagnosis not present

## 2020-02-05 DIAGNOSIS — Z992 Dependence on renal dialysis: Secondary | ICD-10-CM | POA: Diagnosis not present

## 2020-02-05 MED ORDER — MONTELUKAST SODIUM 10 MG PO TABS: 10.0000 mg | ORAL_TABLET | Freq: Every day | ORAL | 0 refills | Status: DC

## 2020-02-06 ENCOUNTER — Ambulatory Visit (INDEPENDENT_AMBULATORY_CARE_PROVIDER_SITE_OTHER): Payer: Medicare HMO | Admitting: Cardiovascular Disease

## 2020-02-06 ENCOUNTER — Encounter: Payer: Self-pay | Admitting: Cardiovascular Disease

## 2020-02-06 ENCOUNTER — Other Ambulatory Visit: Payer: Self-pay

## 2020-02-06 VITALS — BP 116/82 | HR 98 | Temp 97.5°F | Ht 74.0 in | Wt 173.0 lb

## 2020-02-06 DIAGNOSIS — Z79899 Other long term (current) drug therapy: Secondary | ICD-10-CM

## 2020-02-06 DIAGNOSIS — Z992 Dependence on renal dialysis: Secondary | ICD-10-CM | POA: Diagnosis not present

## 2020-02-06 DIAGNOSIS — I428 Other cardiomyopathies: Secondary | ICD-10-CM

## 2020-02-06 DIAGNOSIS — I480 Paroxysmal atrial fibrillation: Secondary | ICD-10-CM

## 2020-02-06 DIAGNOSIS — Z7901 Long term (current) use of anticoagulants: Secondary | ICD-10-CM

## 2020-02-06 DIAGNOSIS — E7800 Pure hypercholesterolemia, unspecified: Secondary | ICD-10-CM

## 2020-02-06 DIAGNOSIS — N186 End stage renal disease: Secondary | ICD-10-CM

## 2020-02-06 DIAGNOSIS — I447 Left bundle-branch block, unspecified: Secondary | ICD-10-CM | POA: Diagnosis not present

## 2020-02-06 DIAGNOSIS — Z5181 Encounter for therapeutic drug level monitoring: Secondary | ICD-10-CM | POA: Diagnosis not present

## 2020-02-06 DIAGNOSIS — I5042 Chronic combined systolic (congestive) and diastolic (congestive) heart failure: Secondary | ICD-10-CM

## 2020-02-06 DIAGNOSIS — I2510 Atherosclerotic heart disease of native coronary artery without angina pectoris: Secondary | ICD-10-CM

## 2020-02-06 DIAGNOSIS — Z21 Asymptomatic human immunodeficiency virus [HIV] infection status: Secondary | ICD-10-CM | POA: Diagnosis not present

## 2020-02-06 MED ORDER — ALLOPURINOL 100 MG PO TABS: 100.0000 mg | ORAL_TABLET | ORAL | 0 refills | Status: DC

## 2020-02-06 NOTE — Progress Notes (Signed)
Patient ID: David [name], male   DOB: 1941/11/07, 79 y.o.   MRN: [id number]    Cardiology Office Note    Date:  02/07/2020   ID:  David [name], David [name] 1941-10-04, MRN [id number]  PCP:  [name], NP  Cardiologist:   [name], MD   No chief complaint on file.   History of Present Illness:  David [name] is a 79 y.o. male with CHF (systolic and diastolic), PAFib, history of PEA arrest, nonischemic dilated cardiomyopathy and ESRD.  David [name] had a major setback in January when he was diagnosed with COVID-19.  Hospitalized for almost a month from January 7 until February 5.  He received remdesivir and dexamethasone.  Palliative care was recommended and he was switched to DNR status.  Subsequently discharged to skilled nursing at [facility].  Since then he has had a visit with Dr. Tommy Medal.  David [name] decided to rescind his DNR status and is again full code.  He was rehospitalized on February 20 with acute pulmonary edema and atrial fibrillation with rapid ventricular response.  He had lost a lot of weight during his COVID-19 infection and his dry weight was reassessed to a lower value (78 kg)..  He was treated with intravenous amiodarone and subsequent oral amiodarone.  After hemodialysis on the day of admission he converted to sinus rhythm, suggesting that the episode of atrial fibrillation was triggered by heart failure decompensation and not vice versa.  He is on chronic warfarin anticoagulation.  He continues to undergo hemodialysis 4 days a week (Monday Tuesday Thursday and Saturday) with abbreviated 3.5-hour sessions.  This allows removal of a lower volume each time and has allowed him to complete dialysis sessions and not be hospitalized with heart failure.  He takes a high dose of midodrine (10 mg in the morning and 10 mg at noon) on dialysis days and takes only 5 mg once daily on other days of the week.  Currently getting dialysis via a right subclavian  permacath.  Low blood pressure prevents use of conventional heart failure medications.  Most recent ejection fraction was estimated at 20-25%.  He has not had angina pectoris, palpitations or syncope.  He denies overt bleeding problems.  He looks very weak.  His rhythm is regular today.   David [name] has a long-standing history of nonischemic cardiomyopathy (normal coronary angio 2004) suffered a PEA arrest on February 17, 5497, complicated by development of new atrial fibrillation and progression to end-stage renal disease now on hemodialysis.  Most recently his ejection fraction has been estimated to be 15-20%.  Coronary angiography after his cardiac arrest showed only minor atherosclerosis without significant stenotic lesions.  He has an atypical left bundle branch block.   He is HIV positive, with undetectable viral load on antiretroviral therapy, monitored by Dr. Tommy Medal.     He has had multiple failed AV fistulas and is now receiving dialysis via a right subclavian permacath.   Past Medical History:  Diagnosis Date  . 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 Acute on chronic systolic and diastolic heart failure, NYHA class 4 ([place])   . Anemia, iron deficiency 11/15/2011  . Arthritis    "hands, right knee, feet" (02/21/2018)  . Atrial fibrillation ([place])   . Cancer [facility])    hx of prostate; s/p radioactive seed implant 10/2009 Dr Janice Norrie  . Cardiac arrest ([place]) 02/17/2018  . CHF (congestive heart failure) ([place])   . Chronic combined systolic and diastolic heart failure, NYHA class 3 (  [place]) 06/2010   felt to be secondary to hypertensive cardiomyopathy  . Chronic lower back pain   . CKD (chronic kidney disease) stage V requiring chronic dialysis ([place])   . ESRD on dialysis [facility])    started 02/2018 [place]  . GERD (gastroesophageal reflux disease)   . Glaucoma    "I had surgery and dont have it any more"  . Gout    daily RX (02/21/2018)  . Heart murmur    "mild" per  pt  . Hepatitis    years ago  . HIV (human immunodeficiency virus infection) ([place])   . HIV infection ([place])   . Hyperlipidemia   . Hypertension    followed by [facility] and Vascular (Dr Dani Gobble Fadil Macmaster)  . Hypertension   . Nonischemic cardiomyopathy ([place])   . Pneumonia 11/2017  . Prostate cancer ([place])   . Sinus bradycardia   . Sleep apnea    does not use a cpap  . Stroke [facility])    "mini stroke" years ago  . Wears glasses     Past Surgical History:  Procedure Laterality Date  . AV FISTULA PLACEMENT Right 10/13/2016   Procedure: ARTERIOVENOUS (AV) FISTULA CREATION;  Surgeon: [name], MD;  Location: [place];  Service: Vascular;  Laterality: Right;  . AV FISTULA PLACEMENT Left 02/25/2018   Procedure: INSERTION OF ARTERIOVENOUS (AV) GORE-TEX GRAFT LEFT UPPER ARM;  Surgeon: Angelia Mould, MD;  Location: [place];  Service: Vascular;  Laterality: Left;  . AV FISTULA PLACEMENT Right 08/07/2018   Procedure: Creation of right arm brachiocephalic Fistula;  Surgeon: Waynetta Sandy, MD;  Location: [place];  Service: Vascular;  Laterality: Right;  . AV FISTULA PLACEMENT Left 11/10/2019   Procedure: INSERTION OF ARTERIOVENOUS (AV) GORE-TEX GRAFT THIGH;  Surgeon: Elam Dutch, MD;  Location: [place];  Service: Vascular;  Laterality: Left;  . AV FISTULA PLACEMENT Right 12/02/2019   Procedure: INSERTION OF ARTERIOVENOUS (AV) GORE-TEX GRAFT RIGHT  THIGH;  Surgeon: Waynetta Sandy, MD;  Location: [place];  Service: Vascular;  Laterality: Right;  . BASCILIC VEIN TRANSPOSITION Left 06/24/2015   Procedure: BASILIC VEIN TRANSPOSITION;  Surgeon: Mal Misty, MD;  Location: [place];  Service: Vascular;  Laterality: Left;  . BIOPSY  03/14/2019   Procedure: BIOPSY;  Surgeon: Irene Shipper, MD;  Location: [place];  Service: Endoscopy;;  . CATARACT EXTRACTION, BILATERAL Bilateral   . COLONOSCOPY WITH PROPOFOL N/A 03/14/2019   Procedure: COLONOSCOPY WITH PROPOFOL;  Surgeon:  Irene Shipper, MD;  Location: [facility] ENDOSCOPY;  Service: Endoscopy;  Laterality: N/A;  . ESOPHAGOGASTRODUODENOSCOPY (EGD) WITH PROPOFOL N/A 05/05/2014   Procedure: ESOPHAGOGASTRODUODENOSCOPY (EGD) WITH PROPOFOL;  Surgeon: Missy Sabins, MD;  Location: [facility] ENDOSCOPY;  Service: Endoscopy;  Laterality: N/A;  . EYE SURGERY Bilateral    cataract surgery   . GLAUCOMA SURGERY Bilateral   . INSERTION OF ARTERIOVENOUS (AV) ARTEGRAFT ARM  10/09/2018   Procedure: INSERTION OF ARTERIOVENOUS (AV) 62mm x 41cm ARTEGRAFT LEFT UPPER ARM;  Surgeon: Waynetta Sandy, MD;  Location: [place];  Service: Vascular;;  . INSERTION OF DIALYSIS CATHETER Right 07/14/2019   Procedure: Insertion Of Dialysis Catheter;  Surgeon: Elam Dutch, MD;  Location: [name] OR;  Service: Vascular;  Laterality: Right;  placed right Internal Jugular  . INSERTION PROSTATE RADIATION SEED    . IR FLUORO GUIDE CV LINE RIGHT  02/18/2018  . IR REMOVAL TUN CV CATH W/O FL  12/06/2018  . IR  THROMBECTOMY AV FISTULA W/THROMBOLYSIS/PTA INC/SHUNT/IMG RIGHT Right 01/26/2020  . IR US GUIDE VASC ACCESS RIGHT  02/18/2018  . IR US GUIDE VASC ACCESS RIGHT  01/26/2020  . LEFT HEART CATH AND CORONARY ANGIOGRAPHY N/A 02/20/2018   Procedure: LEFT HEART CATH AND CORONARY ANGIOGRAPHY;  Surgeon: Jettie Booze, MD;  Location: [place] CV LAB;;; 50% ostial OM2, 25% mLAD.  Marland Kitchen LEFT HEART CATH AND CORONARY ANGIOGRAPHY  2004   mildly depressed LV systolic fx EF 78%,[facility] coronaries/abdominal aorta/renal arteries.  . LOWER EXTREMITY ANGIOGRAM Left 11/17/2019   Procedure: Lower Extremity Fistulogram;  Surgeon: Marty Heck, MD;  Location: [place];  Service: Vascular;  Laterality: Left;  . NM [place]  02/21/2010   No ischemia or infarction.  EF 27%.  Marland Kitchen RADIOACTIVE SEED IMPLANT  2010   prostate cancer  . REVISION OF ARTERIOVENOUS GORETEX GRAFT Right 07/11/2019   Procedure: REVISION OF ARTERIOVENOUS GORETEX GRAFT RIGHT ARM WITH ARTEGRAFT;  Surgeon:  Serafina Mitchell, MD;  Location: [place];  Service: Vascular;  Laterality: Right;  . SHUNTOGRAM Right 07/14/2019   Procedure: Shuntogram;  Surgeon: Elam Dutch, MD;  Location: [place];  Service: Vascular;  Laterality: Right;  . THROMBECTOMY AND REVISION OF ARTERIOVENTOUS (AV) GORETEX  GRAFT Right 07/14/2019   Procedure: THROMBECTOMY AND REVISION OF ARTERIOVENTOUS (AV) GORETEX  GRAFT;  Surgeon: Elam Dutch, MD;  Location: [place];  Service: Vascular;  Laterality: Right;  . THROMBECTOMY W/ EMBOLECTOMY Left 02/26/2018   Procedure: THROMBECTOMY ARTERIOVENOUS GRAFT;  Surgeon: Angelia Mould, MD;  Location: [place];  Service: Vascular;  Laterality: Left;  . THROMBECTOMY W/ EMBOLECTOMY Left 11/17/2019   Procedure: THROMBECTOMY ARTERIOVENOUS GRAFT LEFT THIGH;  Surgeon: Marty Heck, MD;  Location: [place];  Service: Vascular;  Laterality: Left;  . TRANSTHORACIC ECHOCARDIOGRAM  02/2012   [facility]) EF 45-50% with mild global HK.  Mild LVH,LA mod. dilated,mild-mod. 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 AI.  Marland Kitchen TRANSTHORACIC ECHOCARDIOGRAM  9/'18; 3/'19   a) Severely reduced EF.  25 from 30%.  GR 1 DD with diffuse ecchymosis. Mild AS & AI.  Mild LA/RA dilation; b) (post PEA arest):   Moderately dilated LV with moderate concentric virtually.  EF 15 to 20%.  GR 1 DD.  Paradoxical septal motion. Mild AI.  Mild LA dilation.  Poorly visualized RV.  Marland Kitchen TRANSTHORACIC ECHOCARDIOGRAM  02/2019   EF 20-25%.  Unable to assess diastolic function (A. Fib).  No LV apical thrombus.  Mild AI.  Mildly reduced RV function, however poorly visualized.  Marland Kitchen ULTRASOUND GUIDANCE FOR VASCULAR ACCESS  02/20/2018   Procedure: Ultrasound Guidance For Vascular Access;  Surgeon: Jettie Booze, MD;  Location: [place] CV LAB;  Service: Cardiovascular;;  . UPPER EXTREMITY VENOGRAPHY N/A 07/08/2018   Procedure: UPPER EXTREMITY VENOGRAPHY;  Surgeon: Waynetta Sandy, MD;  Location: [place] CV LAB;  Service: Cardiovascular;  Laterality: N/A;  Bilateral    Current Outpatient Medications  Medication Sig Dispense Refill  . acetaminophen (TYLENOL) 500 MG tablet Take 1,000 mg by mouth every 6 (six) hours as needed for moderate pain.    Marland Kitchen allopurinol (ZYLOPRIM) 100 MG tablet Take 1 tablet (100 mg total) by mouth See admin instructions. Take one tablet (100 mg) by mouth Tuesday, Thursday, Saturday after dialysis 40 tablet 0  . amiodarone (PACERONE) 200 MG tablet Take 1 tablet (200 mg total) by mouth daily. (Patient taking differently: Take 200 mg by mouth daily at  12 noon. ) 90 tablet 3  . bictegravir-emtricitabine-tenofovir AF ([name]) 50-200-25 MG TABS tablet Take 1 tablet by mouth daily. (Patient taking differently: Take 1 tablet by mouth daily at 12 noon. ) 30 tablet 5  . brimonidine (ALPHAGAN) 0.15 % ophthalmic solution Place 1 drop into the left eye 2 (two) times daily.  3  . dorzolamide-timolol (COSOPT) 22.3-6.8 MG/ML ophthalmic solution Place 1 drop into the left eye 2 (two) times daily.    Marland Kitchen latanoprost (XALATAN) 0.005 % ophthalmic solution Place 1 drop into the left eye at bedtime.     Marland Kitchen loratadine (CLARITIN) 10 MG tablet Take 10 mg by mouth daily at 12 noon.     . midodrine (PROAMATINE) 10 MG tablet Take 1 tablet (10 mg total) by mouth as directed. Twice a day on the day of HD Tuesday, Thursday, Saturday (1 in AM and 1 at [place]). Take 1/2 tablet (5mg ) a day on non HD days (Patient taking differently: Take 10 mg by mouth See admin instructions. Take one tablet (10 mg) by mouth twice daily - 6am and 12pm -  on dialysis days - Tuesday, Thursday, Saturday) 90 tablet 5  . midodrine (PROAMATINE) 5 MG tablet Take 5 mg by mouth See admin instructions. Take one tablet (5 mg) by mouth at 9 am on non-dialysis days - Sunday, Monday, Wednesday, Friday    . montelukast (SINGULAIR) 10 MG tablet Take 1 tablet (10 mg total) by mouth at bedtime. NEEDS OV/FOLLOW UP 30 tablet 0  . Multiple  Vitamin (MULTIVITAMIN WITH MINERALS) TABS tablet Take 1 tablet by mouth daily at 2 PM.    . polyethylene glycol (MIRALAX / GLYCOLAX) 17 g packet Take 17 g by mouth every evening.    . sucroferric oxyhydroxide (VELPHORO) 500 MG chewable tablet Chew 500 mg by mouth 3 (three) times daily with meals.    . traMADol (ULTRAM) 50 MG tablet Take 1 tablet (50 mg total) by mouth every 6 (six) hours as needed. 5 tablet 0  . warfarin (COUMADIN) 5 MG tablet Take 5mg  daily for next 3days and then check INR 2/25, goal INR 2-3  0   No current facility-administered medications for this visit.    Allergies:   Dextromethorphan-guaifenesin, Tocotrienols, and Losartan potassium   Social History   Socioeconomic History  . Marital status: Married    Spouse name: Not on file  . Number of children: 2  . Years of education: 35  . Highest education level: Not on file  Occupational History  . 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 Smoking status: Former Smoker    Packs/day: 1.00    Years: 30.00    Pack years: 30.00    Types: Cigarettes    Quit date: 2006    Years since quitting: 15.1  . Smokeless tobacco: Never Used  Substance and Sexual Activity  . Alcohol use: Yes    Alcohol/week: 1.0 standard drinks    Types: 1 Shots of liquor per week    Comment: occasional  . Drug use: Never  . Sexual activity: Not Currently    Comment: declined condoms 09/2019  Other Topics Concern  . Not on file  Social History Narrative   ** Merged History Encounter **       Stays active at home Regular exercise: no Drinks 2 cups of coffee a week, 1 mountain dew soda a day.   Social Determinants of Health   Financial Resource Strain:   .  Difficulty of Paying Living Expenses: Not on file  Food Insecurity: No Food Insecurity  . Worried About Charity fundraiser in the Last Year: Never true  . Ran Out of Food in the Last Year: Never true  Transportation Needs: No Transportation  Needs  . Lack of Transportation (Medical): No  . Lack of Transportation (Non-Medical): No  Physical Activity:   . Days of Exercise per Week: Not on file  . Minutes of Exercise per Session: Not on file  Stress:   . Feeling of Stress : Not on file  Social Connections:   . Frequency of Communication with Friends and Family: Not on file  . Frequency of Social Gatherings with Friends and Family: Not on file  . Attends Religious Services: Not on file  . Active Member of Clubs or Organizations: Not on file  . Attends Archivist Meetings: Not on file  . Marital Status: Not on file     Family History:  The patient's family history includes Cholelithiasis in his daughter and son; Diabetes in his maternal grandmother; Hypertension in his maternal grandmother and mother; Thyroid disease in his mother.   ROS:   Please see the history of present illness.    All other systems are reviewed and are negative  PHYSICAL EXAM:   VS:  BP 116/82   Pulse 98   Temp (!) 97.5 F (36.4 C)   Ht 6\' 2"  (1.88 m)   Wt 173 lb (78.5 kg)   SpO2 95%   BMI 22.21 kg/m      General: Alert, oriented x3, no distress, appears weak and chronically ill Head: no evidence of trauma, PERRL, EOMI, no exophtalmos or lid lag, no myxedema, no xanthelasma; normal ears, nose and oropharynx Neck: normal jugular venous pulsations and no hepatojugular reflux; brisk carotid pulses without delay and no carotid bruits Chest: clear to auscultation, no signs of consolidation by percussion or palpation, normal fremitus, symmetrical and full respiratory excursions; right subclavian permacath. Cardiovascular: normal position and quality of the apical impulse, regular rhythm, normal first and paradoxically split second heart sounds, no murmurs, rubs or gallops Abdomen: no tenderness or distention, no masses by palpation, no abnormal pulsatility or arterial bruits, normal bowel sounds, no hepatosplenomegaly Extremities: no  clubbing, cyanosis or edema; 2+ radial, ulnar and brachial pulses bilaterally; 2+ right femoral, posterior tibial and dorsalis pedis pulses; 2+ left femoral, posterior tibial and dorsalis pedis pulses; no subclavian or femoral bruits Neurological: grossly nonfocal.  Responses are slow but appropriate.  He is fully oriented. Psych: Normal mood and affect   Wt Readings from Last 3 Encounters:  02/06/20 173 lb (78.5 kg)  02/02/20 171 lb (77.6 kg)  01/27/20 166 lb 7.2 oz (75.5 kg)      Studies/Labs Reviewed:   EKG:  EKG is not ordered today.  01/24/2020 ECG shows sinus rhythm, left bundle branch block, left axis deviation  Recent Labs: 12/11/2019: TSH 0.294 01/09/2020: B Natriuretic Peptide 641.5; Magnesium 1.7 01/25/2020: ALT 16 01/27/2020: BUN 15; Creatinine, Ser 5.25; Hemoglobin 9.2; Platelets 239; Potassium 3.9; Sodium 136   Lipid Panel    Component Value Date/Time   CHOL 122 01/08/2020 0117   CHOL 197 12/31/2017 0945   TRIG 29 01/08/2020 0117   HDL 44 01/08/2020 0117   HDL 47 12/31/2017 0945   CHOLHDL 2.8 01/08/2020 0117   VLDL 6 01/08/2020 0117   LDLCALC 72 01/08/2020 0117   LDLCALC 145 (H) 12/18/2018 0930     ASSESSMENT:  1. Chronic combined systolic and diastolic heart failure, NYHA class 3 ([place])   2. Nonischemic cardiomyopathy ([place])   3. Coronary artery disease involving native coronary artery of native heart without angina pectoris   4. Hypercholesterolemia   5. Paroxysmal atrial fibrillation (HCC)   6. LBBB (left bundle branch block)   7. ESRD on hemodialysis ([place])   8. Asymptomatic HIV infection ([place])   9. Encounter for monitoring amiodarone therapy   10. Long term (current) use of anticoagulants      PLAN:  In order of problems listed above:  1. CHF: He appears to be euvolemic.  Very poor functional status due to multiple illnesses and a couple of recent hospitalizations.  Most recent LVEF 20-25%.  Prognosis is very poor, I think he really beat the odds  when he survived his 1 month hospitalization with COVID-19 infection.  I am not sure that he truly realizes the implications of his "full CODE STATUS".  Nevertheless he has talked this over in detail and has made a firm decision. 2. CMP: He has primarily nonischemic cardiomyopathy.  Mild CAD by cardiac catheterization.  Unable to tolerate heart failure meds due to low blood pressure. 3. CAD: Very little evidence of progression over time.  He does not have angina pectoris. 4. HLP: LDL cholesterol 72, without medications. 5. AFib: Had atrial fibrillation with RVR during recent heart failure exacerbation, but the arrhythmia was likely secondary to the volume overload and not vice versa.  On warfarin anticoagulation. CHADSVasc4 (age 59, HF, history of hypertension) 6. LBBB: Unfortunately is not a candidate for CRT due to his problems with venous access for dialysis. 7. ESRD: Has done well with a 4 times weekly dialysis schedule.  Recently reassess dry weight at 78 kg. 8. HIV: Controlled on antiretrovirals, followed by Dr. Tommy Medal.  Undetectable viral load twice in 2020, borderline detectable last month, following his recent coronavirus illness.. 9. Amiodarone: Normal  liver function tests just a few days ago.  TSH was borderline suppressed on December 11, 2019, when he came in with COVID-19.  Suspect euthyroid sick syndrome.  Recheck TSH and free T4 in a few months. 10. Warfarin: No serious bleeding complications.  Most recent INR subtherapeutic at 1.5.  Dosage being adjusted  Medication Adjustments/Labs and Tests Ordered: Current medicines are reviewed at length with the patient today.  Concerns regarding medicines are outlined above.  Medication changes, Labs and Tests ordered today are listed below. Patient Instructions  Medication Instructions:  No changes *If you need a refill on your cardiac medications before your next appointment, please call your pharmacy*   Lab Work: None ordered If you have  labs (blood work) drawn today and your tests are completely normal, you will receive your results only by: Marland Kitchen MyChart Message (if you have MyChart) OR . A paper copy in the mail If you have any lab test that is abnormal or we need to change your treatment, we will call you to review the results.   Testing/Procedures: None ordered   Follow-Up: At [facility], you and your health needs are our priority.  As part of our continuing mission to provide you with exceptional heart care, we have created designated Provider Care Teams.  These Care Teams include your primary Cardiologist (physician) and Advanced Practice Providers (APPs -  Physician Assistants and Nurse Practitioners) who all work together to provide you with the care you need, when you need it.  We recommend signing up for the patient portal called "  MyChart".  Sign up information is provided on this After Visit Summary.  MyChart is used to connect with patients for Virtual Visits (Telemedicine).  Patients are able to view lab/test results, encounter notes, upcoming appointments, etc.  Non-urgent messages can be sent to your provider as well.   To learn more about what you can do with MyChart, go to [email or web address].    Your next appointment:   5 month(s)  The format for your next appointment:   In Person  Provider:   You may see [name], MD or one of the following Advanced Practice Providers on your designated Care Team:    Almyra Deforest, PA-C  Fabian Sharp, [place] or        Signed, [name], MD  02/07/2020 3:44 PM    [address], [place], [place]  [address] Phone: [phone]; Fax: [phone]

## 2020-02-06 NOTE — Patient Instructions (Signed)
Medication Instructions:  No changes *If you need a refill on your cardiac medications before your next appointment, please call your pharmacy*   Lab Work: None ordered If you have labs (blood work) drawn today and your tests are completely normal, you will receive your results only by: Marland Kitchen MyChart Message (if you have MyChart) OR . A paper copy in the mail If you have any lab test that is abnormal or we need to change your treatment, we will call you to review the results.   Testing/Procedures: None ordered   Follow-Up: At [facility], you and your health needs are our priority.  As part of our continuing mission to provide you with exceptional heart care, we have created designated Provider Care Teams.  These Care Teams include your primary Cardiologist (physician) and Advanced Practice Providers (APPs -  Physician Assistants and Nurse Practitioners) who all work together to provide you with the care you need, when you need it.  We recommend signing up for the patient portal called "MyChart".  Sign up information is provided on this After Visit Summary.  MyChart is used to connect with patients for Virtual Visits (Telemedicine).  Patients are able to view lab/test results, encounter notes, upcoming appointments, etc.  Non-urgent messages can be sent to your provider as well.   To learn more about what you can do with MyChart, go to [email or web address].    Your next appointment:   5 month(s)  The format for your next appointment:   In Person  Provider:   You may see [name], MD or one of the following Advanced Practice Providers on your designated Care Team:    Almyra Deforest, PA-C  Fabian Sharp, [place] or

## 2020-02-07 DIAGNOSIS — N2581 Secondary hyperparathyroidism of renal origin: Secondary | ICD-10-CM | POA: Diagnosis not present

## 2020-02-07 DIAGNOSIS — N186 End stage renal disease: Secondary | ICD-10-CM | POA: Diagnosis not present

## 2020-02-07 DIAGNOSIS — Z992 Dependence on renal dialysis: Secondary | ICD-10-CM | POA: Diagnosis not present

## 2020-02-07 DIAGNOSIS — E8770 Fluid overload, unspecified: Secondary | ICD-10-CM | POA: Diagnosis not present

## 2020-02-09 ENCOUNTER — Telehealth: Payer: Self-pay

## 2020-02-09 DIAGNOSIS — N2581 Secondary hyperparathyroidism of renal origin: Secondary | ICD-10-CM | POA: Diagnosis not present

## 2020-02-09 DIAGNOSIS — E8770 Fluid overload, unspecified: Secondary | ICD-10-CM | POA: Diagnosis not present

## 2020-02-09 DIAGNOSIS — N186 End stage renal disease: Secondary | ICD-10-CM | POA: Diagnosis not present

## 2020-02-09 DIAGNOSIS — Z992 Dependence on renal dialysis: Secondary | ICD-10-CM | POA: Diagnosis not present

## 2020-02-09 NOTE — Telephone Encounter (Signed)
Spoke to [place] at [facility] regarding this pt's grafting being clotted. She is going to call IR and will let us know if she needs anything else from [place].

## 2020-02-10 DIAGNOSIS — Z992 Dependence on renal dialysis: Secondary | ICD-10-CM | POA: Diagnosis not present

## 2020-02-10 DIAGNOSIS — E8770 Fluid overload, unspecified: Secondary | ICD-10-CM | POA: Diagnosis not present

## 2020-02-10 DIAGNOSIS — N2581 Secondary hyperparathyroidism of renal origin: Secondary | ICD-10-CM | POA: Diagnosis not present

## 2020-02-10 DIAGNOSIS — N186 End stage renal disease: Secondary | ICD-10-CM | POA: Diagnosis not present

## 2020-02-10 DIAGNOSIS — M6281 Muscle weakness (generalized): Secondary | ICD-10-CM | POA: Diagnosis not present

## 2020-02-10 DIAGNOSIS — I5040 Unspecified combined systolic (congestive) and diastolic (congestive) heart failure: Secondary | ICD-10-CM | POA: Diagnosis not present

## 2020-02-10 DIAGNOSIS — R41841 Cognitive communication deficit: Secondary | ICD-10-CM | POA: Diagnosis not present

## 2020-02-10 DIAGNOSIS — R2681 Unsteadiness on feet: Secondary | ICD-10-CM | POA: Diagnosis not present

## 2020-02-12 DIAGNOSIS — N186 End stage renal disease: Secondary | ICD-10-CM | POA: Diagnosis not present

## 2020-02-12 DIAGNOSIS — E8770 Fluid overload, unspecified: Secondary | ICD-10-CM | POA: Diagnosis not present

## 2020-02-12 DIAGNOSIS — Z992 Dependence on renal dialysis: Secondary | ICD-10-CM | POA: Diagnosis not present

## 2020-02-12 DIAGNOSIS — N2581 Secondary hyperparathyroidism of renal origin: Secondary | ICD-10-CM | POA: Diagnosis not present

## 2020-02-12 LAB — PROTIME-INR: INR: 2.8 — AB (ref 0.9–1.1)

## 2020-02-14 DIAGNOSIS — Z992 Dependence on renal dialysis: Secondary | ICD-10-CM | POA: Diagnosis not present

## 2020-02-14 DIAGNOSIS — N186 End stage renal disease: Secondary | ICD-10-CM | POA: Diagnosis not present

## 2020-02-14 DIAGNOSIS — N2581 Secondary hyperparathyroidism of renal origin: Secondary | ICD-10-CM | POA: Diagnosis not present

## 2020-02-14 DIAGNOSIS — E8770 Fluid overload, unspecified: Secondary | ICD-10-CM | POA: Diagnosis not present

## 2020-02-16 DIAGNOSIS — Z992 Dependence on renal dialysis: Secondary | ICD-10-CM | POA: Diagnosis not present

## 2020-02-16 DIAGNOSIS — E8770 Fluid overload, unspecified: Secondary | ICD-10-CM | POA: Diagnosis not present

## 2020-02-16 DIAGNOSIS — N186 End stage renal disease: Secondary | ICD-10-CM | POA: Diagnosis not present

## 2020-02-16 DIAGNOSIS — N2581 Secondary hyperparathyroidism of renal origin: Secondary | ICD-10-CM | POA: Diagnosis not present

## 2020-02-17 ENCOUNTER — Other Ambulatory Visit: Payer: Self-pay

## 2020-02-17 DIAGNOSIS — E8770 Fluid overload, unspecified: Secondary | ICD-10-CM | POA: Diagnosis not present

## 2020-02-17 DIAGNOSIS — Z992 Dependence on renal dialysis: Secondary | ICD-10-CM | POA: Diagnosis not present

## 2020-02-17 DIAGNOSIS — N2581 Secondary hyperparathyroidism of renal origin: Secondary | ICD-10-CM | POA: Diagnosis not present

## 2020-02-17 DIAGNOSIS — N186 End stage renal disease: Secondary | ICD-10-CM | POA: Diagnosis not present

## 2020-02-19 DIAGNOSIS — N186 End stage renal disease: Secondary | ICD-10-CM | POA: Diagnosis not present

## 2020-02-19 DIAGNOSIS — Z992 Dependence on renal dialysis: Secondary | ICD-10-CM | POA: Diagnosis not present

## 2020-02-19 DIAGNOSIS — N2581 Secondary hyperparathyroidism of renal origin: Secondary | ICD-10-CM | POA: Diagnosis not present

## 2020-02-19 DIAGNOSIS — E8770 Fluid overload, unspecified: Secondary | ICD-10-CM | POA: Diagnosis not present

## 2020-02-20 ENCOUNTER — Ambulatory Visit (INDEPENDENT_AMBULATORY_CARE_PROVIDER_SITE_OTHER): Payer: Medicare HMO | Admitting: Family

## 2020-02-20 ENCOUNTER — Other Ambulatory Visit: Payer: Self-pay

## 2020-02-20 ENCOUNTER — Ambulatory Visit (INDEPENDENT_AMBULATORY_CARE_PROVIDER_SITE_OTHER): Payer: Medicare HMO | Admitting: Pharmacist Clinician (PhC)/ Clinical Pharmacy Specialist

## 2020-02-20 ENCOUNTER — Encounter: Payer: Self-pay | Admitting: Family

## 2020-02-20 VITALS — BP 126/71 | HR 78 | Temp 97.7°F | Resp 16 | Ht 74.0 in | Wt 173.0 lb

## 2020-02-20 DIAGNOSIS — Z992 Dependence on renal dialysis: Secondary | ICD-10-CM | POA: Diagnosis not present

## 2020-02-20 DIAGNOSIS — I482 Chronic atrial fibrillation, unspecified: Secondary | ICD-10-CM | POA: Diagnosis not present

## 2020-02-20 DIAGNOSIS — K5909 Other constipation: Secondary | ICD-10-CM | POA: Diagnosis not present

## 2020-02-20 DIAGNOSIS — N186 End stage renal disease: Secondary | ICD-10-CM | POA: Diagnosis not present

## 2020-02-20 DIAGNOSIS — I5040 Unspecified combined systolic (congestive) and diastolic (congestive) heart failure: Secondary | ICD-10-CM | POA: Diagnosis not present

## 2020-02-20 DIAGNOSIS — Z7901 Long term (current) use of anticoagulants: Secondary | ICD-10-CM

## 2020-02-20 DIAGNOSIS — U071 COVID-19: Secondary | ICD-10-CM | POA: Diagnosis not present

## 2020-02-20 DIAGNOSIS — R1310 Dysphagia, unspecified: Secondary | ICD-10-CM

## 2020-02-20 DIAGNOSIS — Z515 Encounter for palliative care: Secondary | ICD-10-CM

## 2020-02-20 LAB — PROTIME-INR: INR: 3.2 — AB (ref 0.9–1.1)

## 2020-02-20 NOTE — Progress Notes (Signed)
Subjective:    Patient ID: David [name], male    DOB: [birth year], 79 y.o.   MRN: [id number]  HPI  Patient is a 79 yr old male who presents today for hospital follow up. He is accompanied today by his [name].     Pt was hospitalized with acute respiratory failure/hypoxia and AF with RVR 2/20-2/23. Was discharged to [facility] rehab. He was there for about 1 month. Since returning home he reports that he continues to get stronger. He states that home health PT was not set up at his discharge from the rehab. States he is doing the exercises at home on his own. He states that he is satisfied doing it on his own and does not wish to initiate home health PT.  Prior to the above hospitalization he was admitted 12/11/19-01/09/20 with covid-19   Wt Readings from Last 3 Encounters:  02/20/20 173 lb (78.5 kg)  02/06/20 173 lb (78.5 kg)  02/02/20 171 lb (77.6 kg)   Constipation- states that he has to take miralax daily otherwise he has bad constipation.  Wonders why this is the case.  ESRD- he has been requiring HD 4x a week to prevent volume overload.   Chronic systolic/diastolic CHF- followed by cardiology- D.r Croitoru  C/o dysphagia- sometimes he feels like food "won't go down."   Review of Systems Past Medical History:  Diagnosis Date  . 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 Acute on chronic systolic and diastolic heart failure, NYHA class 4 ([place])   . Anemia, iron deficiency 11/15/2011  . Arthritis    "hands, right knee, feet" (02/21/2018)  . Atrial fibrillation ([place])   . Cancer [facility])    hx of prostate; s/p radioactive seed implant 10/2009 Dr Janice Norrie  . Cardiac arrest ([place]) 02/17/2018  . CHF (congestive heart failure) ([place])   . 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 Chronic lower back pain   . CKD (chronic kidney disease) stage V requiring chronic dialysis ([place])   . ESRD on  dialysis [facility])    started 02/2018 [place]  . GERD (gastroesophageal reflux disease)   . Glaucoma    "I had surgery and dont have it any more"  . Gout    daily RX (02/21/2018)  . Heart murmur    "mild" per pt  . Hepatitis    years ago  . HIV (human immunodeficiency virus infection) ([place])   . HIV infection ([place])   . Hyperlipidemia   . Hypertension    followed by [facility] and Vascular (Dr Dani Gobble Croitoru)  . Hypertension   . Nonischemic cardiomyopathy ([place])   . Pneumonia 11/2017  . Prostate cancer ([place])   . Sinus bradycardia   . Sleep apnea    does not use a cpap  . Stroke [facility])    "mini stroke" years ago  . Wears glasses      Social History   Socioeconomic History  . Marital status: Married    Spouse name: Not on file  . Number of children: 2  . Years of education: 90  . Highest education level: Not on file  Occupational History  . 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 Smoking status: Former Smoker    Packs/day: 1.00    Years: 30.00    Pack years: 30.00    Types: Cigarettes  Quit date: 2006    Years since quitting: 15.2  . Smokeless tobacco: Never Used  Substance and Sexual Activity  . Alcohol use: Yes    Alcohol/week: 1.0 standard drinks    Types: 1 Shots of liquor per week    Comment: occasional  . Drug use: Never  . Sexual activity: Not Currently    Comment: declined condoms 09/2019  Other Topics Concern  . Not on file  Social History Narrative   ** Merged History Encounter **       Stays active at home Regular exercise: no Drinks 2 cups of coffee a week, 1 mountain dew soda a day.   Social Determinants of Health   Financial Resource Strain:   . Difficulty of Paying Living Expenses:   Food Insecurity: No Food Insecurity  . Worried About Charity fundraiser in the Last Year: Never true  . Ran Out of Food in the Last Year: Never true  Transportation Needs: No  Transportation Needs  . Lack of Transportation (Medical): No  . Lack of Transportation (Non-Medical): No  Physical Activity:   . Days of Exercise per Week:   . Minutes of Exercise per Session:   Stress:   . Feeling of Stress :   Social Connections:   . Frequency of Communication with Friends and Family:   . Frequency of Social Gatherings with Friends and Family:   . Attends Religious Services:   . Active Member of Clubs or Organizations:   . Attends Archivist Meetings:   Marland Kitchen Marital Status:   Intimate Partner Violence:   . Fear of Current or Ex-Partner:   . Emotionally Abused:   Marland Kitchen Physically Abused:   . Sexually Abused:     Past Surgical History:  Procedure Laterality Date  . AV FISTULA PLACEMENT Right 10/13/2016   Procedure: ARTERIOVENOUS (AV) FISTULA CREATION;  Surgeon: [name], MD;  Location: [place];  Service: Vascular;  Laterality: Right;  . AV FISTULA PLACEMENT Left 02/25/2018   Procedure: INSERTION OF ARTERIOVENOUS (AV) GORE-TEX GRAFT LEFT UPPER ARM;  Surgeon: Angelia Mould, MD;  Location: [place];  Service: Vascular;  Laterality: Left;  . AV FISTULA PLACEMENT Right 08/07/2018   Procedure: Creation of right arm brachiocephalic Fistula;  Surgeon: Waynetta Sandy, MD;  Location: [place];  Service: Vascular;  Laterality: Right;  . AV FISTULA PLACEMENT Left 11/10/2019   Procedure: INSERTION OF ARTERIOVENOUS (AV) GORE-TEX GRAFT THIGH;  Surgeon: Elam Dutch, MD;  Location: [place];  Service: Vascular;  Laterality: Left;  . AV FISTULA PLACEMENT Right 12/02/2019   Procedure: INSERTION OF ARTERIOVENOUS (AV) GORE-TEX GRAFT RIGHT  THIGH;  Surgeon: Waynetta Sandy, MD;  Location: [place];  Service: Vascular;  Laterality: Right;  . BASCILIC VEIN TRANSPOSITION Left 06/24/2015   Procedure: BASILIC VEIN TRANSPOSITION;  Surgeon: Mal Misty, MD;  Location: [place];  Service: Vascular;  Laterality: Left;  . BIOPSY  03/14/2019   Procedure: BIOPSY;  Surgeon:  Irene Shipper, MD;  Location: [place];  Service: Endoscopy;;  . CATARACT EXTRACTION, BILATERAL Bilateral   . COLONOSCOPY WITH PROPOFOL N/A 03/14/2019   Procedure: COLONOSCOPY WITH PROPOFOL;  Surgeon: Irene Shipper, MD;  Location: [facility] ENDOSCOPY;  Service: Endoscopy;  Laterality: N/A;  . ESOPHAGOGASTRODUODENOSCOPY (EGD) WITH PROPOFOL N/A 05/05/2014   Procedure: ESOPHAGOGASTRODUODENOSCOPY (EGD) WITH PROPOFOL;  Surgeon: Missy Sabins, MD;  Location: [facility] ENDOSCOPY;  Service: Endoscopy;  Laterality: N/A;  . EYE SURGERY Bilateral    cataract surgery   .  GLAUCOMA SURGERY Bilateral   . INSERTION OF ARTERIOVENOUS (AV) ARTEGRAFT ARM  10/09/2018   Procedure: INSERTION OF ARTERIOVENOUS (AV) 18mm x 41cm ARTEGRAFT LEFT UPPER ARM;  Surgeon: Waynetta Sandy, MD;  Location: [place];  Service: Vascular;;  . INSERTION OF DIALYSIS CATHETER Right 07/14/2019   Procedure: Insertion Of Dialysis Catheter;  Surgeon: Elam Dutch, MD;  Location: [facility] OR;  Service: Vascular;  Laterality: Right;  placed right Internal Jugular  . INSERTION PROSTATE RADIATION SEED    . IR FLUORO GUIDE CV LINE RIGHT  02/18/2018  . IR REMOVAL TUN CV CATH W/O FL  12/06/2018  . IR THROMBECTOMY AV FISTULA W/THROMBOLYSIS/PTA INC/SHUNT/IMG RIGHT Right 01/26/2020  . IR US GUIDE VASC ACCESS RIGHT  02/18/2018  . IR US GUIDE VASC ACCESS RIGHT  01/26/2020  . LEFT HEART CATH AND CORONARY ANGIOGRAPHY N/A 02/20/2018   Procedure: LEFT HEART CATH AND CORONARY ANGIOGRAPHY;  Surgeon: Jettie Booze, MD;  Location: [place] CV LAB;;; 50% ostial OM2, 25% mLAD.  Marland Kitchen LEFT HEART CATH AND CORONARY ANGIOGRAPHY  2004   mildly depressed LV systolic fx EF 16%,SAYTKZ coronaries/abdominal aorta/renal arteries.  . LOWER EXTREMITY ANGIOGRAM Left 11/17/2019   Procedure: Lower Extremity Fistulogram;  Surgeon: Marty Heck, MD;  Location: [place];  Service: Vascular;  Laterality: Left;  . NM [place]  02/21/2010   No ischemia or infarction.  EF  27%.  Marland Kitchen RADIOACTIVE SEED IMPLANT  2010   prostate cancer  . REVISION OF ARTERIOVENOUS GORETEX GRAFT Right 07/11/2019   Procedure: REVISION OF ARTERIOVENOUS GORETEX GRAFT RIGHT ARM WITH ARTEGRAFT;  Surgeon: Serafina Mitchell, MD;  Location: [place];  Service: Vascular;  Laterality: Right;  . SHUNTOGRAM Right 07/14/2019   Procedure: Shuntogram;  Surgeon: Elam Dutch, MD;  Location: [place];  Service: Vascular;  Laterality: Right;  . THROMBECTOMY AND REVISION OF ARTERIOVENTOUS (AV) GORETEX  GRAFT Right 07/14/2019   Procedure: THROMBECTOMY AND REVISION OF ARTERIOVENTOUS (AV) GORETEX  GRAFT;  Surgeon: Elam Dutch, MD;  Location: [place];  Service: Vascular;  Laterality: Right;  . THROMBECTOMY W/ EMBOLECTOMY Left 02/26/2018   Procedure: THROMBECTOMY ARTERIOVENOUS GRAFT;  Surgeon: Angelia Mould, MD;  Location: [place];  Service: Vascular;  Laterality: Left;  . THROMBECTOMY W/ EMBOLECTOMY Left 11/17/2019   Procedure: THROMBECTOMY ARTERIOVENOUS GRAFT LEFT THIGH;  Surgeon: Marty Heck, MD;  Location: [place];  Service: Vascular;  Laterality: Left;  . TRANSTHORACIC ECHOCARDIOGRAM  02/2012   [facility]) EF 45-50% with mild global HK.  Mild LVH,LA mod. dilated,mild-mod. 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 AI.  Marland Kitchen TRANSTHORACIC ECHOCARDIOGRAM  9/'18; 3/'19   a) Severely reduced EF.  25 from 30%.  GR 1 DD with diffuse ecchymosis. Mild AS & AI.  Mild LA/RA dilation; b) (post PEA arest):   Moderately dilated LV with moderate concentric virtually.  EF 15 to 20%.  GR 1 DD.  Paradoxical septal motion. Mild AI.  Mild LA dilation.  Poorly visualized RV.  Marland Kitchen TRANSTHORACIC ECHOCARDIOGRAM  02/2019   EF 20-25%.  Unable to assess diastolic function (A. Fib).  No LV apical thrombus.  Mild AI.  Mildly reduced RV function, however poorly visualized.  Marland Kitchen ULTRASOUND GUIDANCE FOR VASCULAR ACCESS  02/20/2018   Procedure: Ultrasound Guidance For Vascular Access;  Surgeon: Jettie Booze,  MD;  Location: [place] CV LAB;  Service: Cardiovascular;;  . UPPER EXTREMITY VENOGRAPHY N/A 07/08/2018   Procedure: UPPER EXTREMITY VENOGRAPHY;  Surgeon: Servando Snare  Harrell Gave, MD;  Location: [place] CV LAB;  Service: Cardiovascular;  Laterality: N/A;  Bilateral    Family History  Problem Relation Age of Onset  . Hypertension Mother   . Thyroid disease Mother   . Cholelithiasis Daughter   . Cholelithiasis Son   . Hypertension Maternal Grandmother   . Diabetes Maternal Grandmother   . Heart attack Neg Hx   . Hyperlipidemia Neg Hx     Allergies  Allergen Reactions  . Dextromethorphan-Guaifenesin Other (See Comments)    Unknown reaction  . Tocotrienols Other (See Comments)    Unknown reaction  . Losartan Potassium Other (See Comments)    Causes constipation    Current Outpatient Medications on File Prior to Visit  Medication Sig Dispense Refill  . acetaminophen (TYLENOL) 500 MG tablet Take 1,000 mg by mouth every 6 (six) hours as needed for moderate pain.    Marland Kitchen allopurinol (ZYLOPRIM) 100 MG tablet Take 1 tablet (100 mg total) by mouth See admin instructions. Take one tablet (100 mg) by mouth Tuesday, Thursday, Saturday after dialysis 40 tablet 0  . amiodarone (PACERONE) 200 MG tablet Take 1 tablet (200 mg total) by mouth daily. (Patient taking differently: Take 200 mg by mouth daily at 12 noon. ) 90 tablet 3  . bictegravir-emtricitabine-tenofovir AF ([name]) 50-200-25 MG TABS tablet Take 1 tablet by mouth daily. (Patient taking differently: Take 1 tablet by mouth daily at 12 noon. ) 30 tablet 5  . brimonidine (ALPHAGAN) 0.15 % ophthalmic solution Place 1 drop into the left eye 2 (two) times daily.  3  . dorzolamide-timolol (COSOPT) 22.3-6.8 MG/ML ophthalmic solution Place 1 drop into the left eye 2 (two) times daily.    Marland Kitchen latanoprost (XALATAN) 0.005 % ophthalmic solution Place 1 drop into the left eye at bedtime.     Marland Kitchen loratadine (CLARITIN) 10 MG tablet Take 10 mg by mouth  daily at 12 noon.     . midodrine (PROAMATINE) 10 MG tablet Take 1 tablet (10 mg total) by mouth as directed. Twice a day on the day of HD Tuesday, Thursday, Saturday (1 in AM and 1 at [place]). Take 1/2 tablet (5mg ) a day on non HD days (Patient taking differently: Take 10 mg by mouth See admin instructions. Take one tablet (10 mg) by mouth twice daily - 6am and 12pm -  on dialysis days - Tuesday, Thursday, Saturday) 90 tablet 5  . midodrine (PROAMATINE) 5 MG tablet Take 5 mg by mouth See admin instructions. Take one tablet (5 mg) by mouth at 9 am on non-dialysis days - Sunday, Monday, Wednesday, Friday    . montelukast (SINGULAIR) 10 MG tablet Take 1 tablet (10 mg total) by mouth at bedtime. NEEDS OV/FOLLOW UP 30 tablet 0  . Multiple Vitamin (MULTIVITAMIN WITH MINERALS) TABS tablet Take 1 tablet by mouth daily at 2 PM.    . polyethylene glycol (MIRALAX / GLYCOLAX) 17 g packet Take 17 g by mouth every evening.    . sucroferric oxyhydroxide (VELPHORO) 500 MG chewable tablet Chew 500 mg by mouth 3 (three) times daily with meals.    . traMADol (ULTRAM) 50 MG tablet Take 1 tablet (50 mg total) by mouth every 6 (six) hours as needed. 5 tablet 0  . warfarin (COUMADIN) 5 MG tablet Take 5mg  daily for next 3days and then check INR 2/25, goal INR 2-3  0   No current facility-administered medications on file prior to visit.    BP 126/71 (BP Location: Left Arm, Patient Position:  Sitting, Cuff Size: Small)   Pulse 78   Temp 97.7 F (36.5 C) (Oral)   Resp 16   Ht 6\' 2"  (1.88 m)   Wt 173 lb (78.5 kg)   SpO2 100%   BMI 22.21 kg/m       Objective:   Physical Exam Constitutional:      General: He is not in acute distress.    Appearance: He is well-developed.  HENT:     Head: Normocephalic and atraumatic.  Cardiovascular:     Rate and Rhythm: Normal rate and regular rhythm.     Heart sounds: No murmur.  Pulmonary:     Effort: Pulmonary effort is normal. No respiratory distress.     Breath sounds:  Normal breath sounds. No wheezing or rales.  Musculoskeletal:     Right lower leg: No edema.     Left lower leg: No edema.  Skin:    General: Skin is warm and dry.  Neurological:     Mental Status: He is alert and oriented to person, place, and time.  Psychiatric:        Behavior: Behavior normal.        Thought Content: Thought content normal.           Assessment & Plan:  CHF- appears clinically euvolemic today.  Chronic constipation- likely exacerbated by frequent HD. Advised ok to continue miralax as needed.   ESRD- doing much better with 4x a week.  Management per Renal.  COVID-19- clinically resolved.  No hypoxia noted today.    Dysphagia- refer to GI for further evaluation.  This visit occurred during the SARS-CoV-2 public health emergency.  Safety protocols were in place, including screening questions prior to the visit, additional usage of staff PPE, and extensive cleaning of exam room while observing appropriate contact time as indicated for disinfecting solutions.

## 2020-02-21 DIAGNOSIS — N186 End stage renal disease: Secondary | ICD-10-CM | POA: Diagnosis not present

## 2020-02-21 DIAGNOSIS — Z992 Dependence on renal dialysis: Secondary | ICD-10-CM | POA: Diagnosis not present

## 2020-02-21 DIAGNOSIS — E8770 Fluid overload, unspecified: Secondary | ICD-10-CM | POA: Diagnosis not present

## 2020-02-21 DIAGNOSIS — N2581 Secondary hyperparathyroidism of renal origin: Secondary | ICD-10-CM | POA: Diagnosis not present

## 2020-02-23 ENCOUNTER — Other Ambulatory Visit (HOSPITAL_COMMUNITY): Payer: Self-pay | Admitting: Nephrology

## 2020-02-23 ENCOUNTER — Other Ambulatory Visit: Payer: Self-pay | Admitting: Student

## 2020-02-23 DIAGNOSIS — N2581 Secondary hyperparathyroidism of renal origin: Secondary | ICD-10-CM | POA: Diagnosis not present

## 2020-02-23 DIAGNOSIS — I8290 Acute embolism and thrombosis of unspecified vein: Secondary | ICD-10-CM

## 2020-02-23 DIAGNOSIS — N186 End stage renal disease: Secondary | ICD-10-CM | POA: Diagnosis not present

## 2020-02-23 DIAGNOSIS — E8770 Fluid overload, unspecified: Secondary | ICD-10-CM | POA: Diagnosis not present

## 2020-02-23 DIAGNOSIS — Z992 Dependence on renal dialysis: Secondary | ICD-10-CM | POA: Diagnosis not present

## 2020-02-24 ENCOUNTER — Ambulatory Visit (HOSPITAL_COMMUNITY)
Admission: RE | Admit: 2020-02-24 | Discharge: 2020-02-24 | Disposition: A | Discharge status: Home / Self Care | Payer: Medicare HMO | Source: Ambulatory Visit | Attending: Nephrology | Admitting: Nephrology

## 2020-02-24 ENCOUNTER — Other Ambulatory Visit: Payer: Self-pay

## 2020-02-24 ENCOUNTER — Other Ambulatory Visit (HOSPITAL_COMMUNITY): Payer: Self-pay | Admitting: Nephrology

## 2020-02-24 DIAGNOSIS — K219 Gastro-esophageal reflux disease without esophagitis: Secondary | ICD-10-CM | POA: Insufficient documentation

## 2020-02-24 DIAGNOSIS — Z992 Dependence on renal dialysis: Secondary | ICD-10-CM | POA: Diagnosis not present

## 2020-02-24 DIAGNOSIS — Z8546 Personal history of malignant neoplasm of prostate: Secondary | ICD-10-CM | POA: Diagnosis not present

## 2020-02-24 DIAGNOSIS — T82868A Thrombosis of vascular prosthetic devices, implants and grafts, initial encounter: Secondary | ICD-10-CM | POA: Insufficient documentation

## 2020-02-24 DIAGNOSIS — G4730 Sleep apnea, unspecified: Secondary | ICD-10-CM | POA: Diagnosis not present

## 2020-02-24 DIAGNOSIS — I8290 Acute embolism and thrombosis of unspecified vein: Secondary | ICD-10-CM

## 2020-02-24 DIAGNOSIS — E8770 Fluid overload, unspecified: Secondary | ICD-10-CM | POA: Diagnosis not present

## 2020-02-24 DIAGNOSIS — E785 Hyperlipidemia, unspecified: Secondary | ICD-10-CM | POA: Insufficient documentation

## 2020-02-24 DIAGNOSIS — I428 Other cardiomyopathies: Secondary | ICD-10-CM | POA: Diagnosis not present

## 2020-02-24 DIAGNOSIS — I5042 Chronic combined systolic (congestive) and diastolic (congestive) heart failure: Secondary | ICD-10-CM | POA: Diagnosis not present

## 2020-02-24 DIAGNOSIS — M109 Gout, unspecified: Secondary | ICD-10-CM | POA: Diagnosis not present

## 2020-02-24 DIAGNOSIS — Y841 Kidney dialysis as the cause of abnormal reaction of the patient, or of later complication, without mention of misadventure at the time of the procedure: Secondary | ICD-10-CM | POA: Insufficient documentation

## 2020-02-24 DIAGNOSIS — Z8674 Personal history of sudden cardiac arrest: Secondary | ICD-10-CM | POA: Insufficient documentation

## 2020-02-24 DIAGNOSIS — Z79899 Other long term (current) drug therapy: Secondary | ICD-10-CM | POA: Insufficient documentation

## 2020-02-24 DIAGNOSIS — N186 End stage renal disease: Secondary | ICD-10-CM | POA: Diagnosis not present

## 2020-02-24 DIAGNOSIS — Z21 Asymptomatic human immunodeficiency virus [HIV] infection status: Secondary | ICD-10-CM | POA: Insufficient documentation

## 2020-02-24 DIAGNOSIS — Z7901 Long term (current) use of anticoagulants: Secondary | ICD-10-CM | POA: Diagnosis not present

## 2020-02-24 DIAGNOSIS — Z8673 Personal history of transient ischemic attack (TIA), and cerebral infarction without residual deficits: Secondary | ICD-10-CM | POA: Insufficient documentation

## 2020-02-24 DIAGNOSIS — N2581 Secondary hyperparathyroidism of renal origin: Secondary | ICD-10-CM | POA: Diagnosis not present

## 2020-02-24 DIAGNOSIS — I4891 Unspecified atrial fibrillation: Secondary | ICD-10-CM | POA: Insufficient documentation

## 2020-02-24 DIAGNOSIS — D509 Iron deficiency anemia, unspecified: Secondary | ICD-10-CM | POA: Insufficient documentation

## 2020-02-24 DIAGNOSIS — I132 Hypertensive heart and chronic kidney disease with heart failure and with stage 5 chronic kidney disease, or end stage renal disease: Secondary | ICD-10-CM | POA: Insufficient documentation

## 2020-02-24 DIAGNOSIS — Z87891 Personal history of nicotine dependence: Secondary | ICD-10-CM | POA: Insufficient documentation

## 2020-02-24 HISTORY — PX: IR THROMBECTOMY AV FISTULA W/THROMBOLYSIS/PTA INC/SHUNT/IMG RIGHT: IMG6119

## 2020-02-24 LAB — BASIC METABOLIC PANEL
Anion gap: 19 — ABNORMAL HIGH (ref 5–15)
BUN: 27 mg/dL — ABNORMAL HIGH (ref 8–23)
CO2: 23 mmol/L (ref 22–32)
Calcium: 8.3 mg/dL — ABNORMAL LOW (ref 8.9–10.3)
Chloride: 94 mmol/L — ABNORMAL LOW (ref 98–111)
Creatinine, Ser: 5.97 mg/dL — ABNORMAL HIGH (ref 0.61–1.24)
GFR calc Af Amer: 10 mL/min — ABNORMAL LOW (ref >60)
GFR calc non Af Amer: 8 mL/min — ABNORMAL LOW (ref >60)
Glucose, Bld: 90 mg/dL (ref 70–99)
Potassium: >7.5 mmol/L (ref 3.5–5.1)
Sodium: 136 mmol/L (ref 135–145)

## 2020-02-24 LAB — PROTIME-INR
INR: 2.7 — ABNORMAL HIGH (ref 0.8–1.2)
Prothrombin Time: 28.5 seconds — ABNORMAL HIGH (ref 11.4–15.2)

## 2020-02-24 MED ORDER — IOHEXOL 300 MG/ML  SOLN
100.0000 mL | Freq: Once | INTRAMUSCULAR | Status: AC | PRN
  Administered 2020-02-24: 50 mL

## 2020-02-24 MED ORDER — SODIUM CHLORIDE 0.9 % IV SOLN: INTRAVENOUS | Status: DC

## 2020-02-24 MED ORDER — FENTANYL CITRATE (PF) 100 MCG/2ML IJ SOLN
INTRAMUSCULAR | Status: AC
  Filled 2020-02-24: qty 2

## 2020-02-24 MED ORDER — ALTEPLASE 2 MG IJ SOLR
INTRAMUSCULAR | Status: AC
  Filled 2020-02-24: qty 4

## 2020-02-24 MED ORDER — LIDOCAINE HCL 1 % IJ SOLN
INTRAMUSCULAR | Status: DC | PRN
  Administered 2020-02-24: 5 mL

## 2020-02-24 MED ORDER — MIDAZOLAM HCL 2 MG/2ML IJ SOLN
INTRAMUSCULAR | Status: AC
  Filled 2020-02-24: qty 2

## 2020-02-24 MED ORDER — FENTANYL CITRATE (PF) 100 MCG/2ML IJ SOLN
INTRAMUSCULAR | Status: DC | PRN
  Administered 2020-02-24 (×2): 25 ug via INTRAVENOUS

## 2020-02-24 MED ORDER — LIDOCAINE HCL 1 % IJ SOLN
INTRAMUSCULAR | Status: AC
  Filled 2020-02-24: qty 20

## 2020-02-24 MED ORDER — ALTEPLASE 2 MG IJ SOLR
INTRAMUSCULAR | Status: DC | PRN
  Administered 2020-02-24: 3 mg

## 2020-02-24 NOTE — Sedation Documentation (Signed)
Patient complains of pain in bottom from lying on the table. Med given at this time

## 2020-02-24 NOTE — Consult Note (Signed)
Chief Complaint: Low flow & clotting - right femoral loop graft  Referring Physician(s): Bergman,Martha  Supervising Physician: Arne Cleveland  Patient Status: [name] - Out-pt  History of Present Illness: David [name] is a 79 y.o. male History of ESRD s/p AV gore-rex graft to right femoral loop graft  12.29.20 with previous graft to left thigh (12.7.21 revised with thromebecotmy on 12.14.20),  right arm (11.6.19 revised on 8.20 and 8.10.20)  and  left arm (3.25.19 and 11.6.19) and right brachopcepahlic  AV fistula (3.8.88) and perm cath placed on 12.22.21. Team is requesting fistulogram due to low flow and clotting with access. Patient states that they have been accessing his perm cath for dialysis and states the has had no flow in the right femoral graft for "a while".    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   Acute on chronic systolic and diastolic heart failure, NYHA class 4 (HCC)    Anemia, iron deficiency 11/15/2011   Arthritis    "hands, right knee, feet" (02/21/2018)   Atrial fibrillation (HCC)    Cancer (HCC)    hx of prostate; s/p radioactive seed implant 10/2009 Dr Janice Norrie   Cardiac arrest [facility]) 02/17/2018   CHF (congestive heart failure) (HCC)    Chronic combined systolic and diastolic heart failure, NYHA class 3 ([place]) 06/2010   felt to be secondary to hypertensive cardiomyopathy   Chronic lower back pain    CKD (chronic kidney disease) stage V requiring chronic dialysis ([place])    ESRD on dialysis ([place])    started 02/2018 [place]   GERD (gastroesophageal reflux disease)    Glaucoma    "I had surgery and dont have it any more"   Gout    daily RX (02/21/2018)   Heart murmur    "mild" per pt   Hepatitis    years ago   HIV (human immunodeficiency virus infection) ([place])    HIV infection ([place])    Hyperlipidemia    Hypertension    followed by [facility] (Dr Dani Gobble Croitoru)     Hypertension    Nonischemic cardiomyopathy ([place])    Pneumonia 11/2017   Prostate cancer ([place])    Sinus bradycardia    Sleep apnea    does not use a cpap   Stroke [facility])    "mini stroke" years ago   Wears glasses     Past Surgical History:  Procedure Laterality Date   AV FISTULA PLACEMENT Right 10/13/2016   Procedure: ARTERIOVENOUS (AV) FISTULA CREATION;  Surgeon: [name], MD;  Location: [place];  Service: Vascular;  Laterality: Right;   AV FISTULA PLACEMENT Left 02/25/2018   Procedure: INSERTION OF ARTERIOVENOUS (AV) GORE-TEX GRAFT LEFT UPPER ARM;  Surgeon: Angelia Mould, MD;  Location: [place];  Service: Vascular;  Laterality: Left;   AV FISTULA PLACEMENT Right 08/07/2018   Procedure: Creation of right arm brachiocephalic Fistula;  Surgeon: Waynetta Sandy, MD;  Location: [place];  Service: Vascular;  Laterality: Right;   AV FISTULA PLACEMENT Left 11/10/2019   Procedure: INSERTION OF ARTERIOVENOUS (AV) GORE-TEX GRAFT THIGH;  Surgeon: Elam Dutch, MD;  Location: [place];  Service: Vascular;  Laterality: Left;   AV FISTULA PLACEMENT Right 12/02/2019   Procedure: INSERTION OF ARTERIOVENOUS (AV) GORE-TEX GRAFT RIGHT  THIGH;  Surgeon: Waynetta Sandy, MD;  Location: [place];  Service: Vascular;  Laterality: Right;   [place] Left 06/24/2015   Procedure:  BASILIC VEIN TRANSPOSITION;  Surgeon: Mal Misty, MD;  Location: [place];  Service: Vascular;  Laterality: Left;   BIOPSY  03/14/2019   Procedure: BIOPSY;  Surgeon: Irene Shipper, MD;  Location: [facility] END[facility]COPY;  Service: Endoscopy;;   CATARACT EXTRACTION, BILATERAL Bilateral    COLONOSCOPY WITH PROPOFOL N/A 03/14/2019   Procedure: COLONOSCOPY WITH PROPOFOL;  Surgeon: Irene Shipper, MD;  Location: [facility] ENDOSCOPY;  Service: Endoscopy;  Laterality: N/A;   ESOPHAGOGASTRODUODENOSCOPY (EGD) WITH PROPOFOL N/A 05/05/2014   Procedure: ESOPHAGOGASTRODUODENOSCOPY (EGD) WITH PROPOFOL;  Surgeon: Missy Sabins, MD;   Location: [facility] ENDOSCOPY;  Service: Endoscopy;  Laterality: N/A;   EYE SURGERY Bilateral    cataract surgery    GLAUCOMA SURGERY Bilateral    INSERTION OF ARTERIOVENOUS (AV) ARTEGRAFT ARM  10/09/2018   Procedure: INSERTION OF ARTERIOVENOUS (AV) 73m x 41cm ARTEGRAFT LEFT UPPER ARM;  Surgeon: CWaynetta Sandy MD;  [place]  Service: Vascular;;   INSERTION OF DIALYSIS CATHETER Right 07/14/2019   Procedure: Insertion Of Dialysis Catheter;  Surgeon: FElam Dutch MD;  [facility]  Service: Vascular;  Laterality: Right;  placed right Internal Jugular   INSERTION PROSTATE RADIATION SEED     IR FLUORO GUIDE CV LINE RIGHT  02/18/2018   IR REMOVAL TUN CV CATH W/O FL  12/06/2018   IR THROMBECTOMY AV FISTULA W/THROMBOLYSIS/PTA INC/SHUNT/IMG RIGHT Right 01/26/2020   IR [place] VASC ACCESS RIGHT  02/18/2018   IR [place] VASC ACCESS RIGHT  01/26/2020   LEFT HEART CATH AND CORONARY ANGIOGRAPHY N/A 02/20/2018   Procedure: LEFT HEART CATH AND CORONARY ANGIOGRAPHY;  Surgeon: VJettie Booze MD;  [place];;; 50% ostial OM2, 25% mLAD.   LEFT HEART CATH AND CORONARY ANGIOGRAPHY  2004   mildly depressed LV systolic fx EF 565%,KPTWSFcoronaries/abdominal aorta/renal arteries.   LOWER EXTREMITY ANGIOGRAM Left 11/17/2019   Procedure: Lower Extremity Fistulogram;  Surgeon: CMarty Heck MD;  [place]  Service: Vascular;  Laterality: Left;   NM MYOCAR PERF WALL MOTION  02/21/2010   No ischemia or infarction.  EF 27%.   RADIOACTIVE SEED IMPLANT  2010   prostate cancer   REVISION OF ARTERIOVENOUS GORETEX GRAFT Right 07/11/2019   Procedure: REVISION OF ARTERIOVENOUS GORETEX GRAFT RIGHT ARM WITH ARTEGRAFT;  Surgeon: BSerafina Mitchell MD;  [place]  Service: Vascular;  Laterality: Right;   SHUNTOGRAM Right 07/14/2019   Procedure: SEarney Mallet  Surgeon: FElam Dutch MD;  [place]  Service: Vascular;  Laterality: Right;   THROMBECTOMY AND  REVISION OF ARTERIOVENTOUS (AV) GORETEX  GRAFT Right 07/14/2019   Procedure: THROMBECTOMY AND REVISION OF ARTERIOVENTOUS (AV) GORETEX  GRAFT;  Surgeon: FElam Dutch MD;  [place]  Service: Vascular;  Laterality: Right;   THROMBECTOMY W/ EMBOLECTOMY Left 02/26/2018   Procedure: THROMBECTOMY ARTERIOVENOUS GRAFT;  Surgeon: DAngelia Mould MD;  [place]  Service: Vascular;  Laterality: Left;   THROMBECTOMY W/ EMBOLECTOMY Left 11/17/2019   Procedure: THROMBECTOMY ARTERIOVENOUS GRAFT LEFT THIGH;  Surgeon: CMarty Heck MD;  [place]  Service: Vascular;  Laterality: Left;   TRANSTHORACIC ECHOCARDIOGRAM  02/2012   (SCecilia EF 45-50% with mild global HK.  Mild LVH,LA mod. dilated,mild-mod. 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 AI.   TRANSTHORACIC ECHOCARDIOGRAM  9/'18; 3/'19   a) Severely reduced EF.  25 from 30%.  GR 1 DD with diffuse ecchymosis. Mild AS & AI.  Mild  LA/RA dilation; b) (post PEA arest):   Moderately dilated LV with moderate concentric virtually.  EF 15 to 20%.  GR 1 DD.  Paradoxical septal motion. Mild AI.  Mild LA dilation.  Poorly visualized RV.   TRANSTHORACIC ECHOCARDIOGRAM  02/2019   EF 20-25%.  Unable to assess diastolic function (A. Fib).  No LV apical thrombus.  Mild AI.  Mildly reduced RV function, however poorly visualized.   ULTRASOUND GUIDANCE FOR VASCULAR ACCESS  02/20/2018   Procedure: Ultrasound Guidance For Vascular Access;  Surgeon: Jettie Booze, MD;  Location: [place] CV LAB;  Service: Cardiovascular;;   UPPER EXTREMITY VENOGRAPHY N/A 07/08/2018   Procedure: UPPER EXTREMITY VENOGRAPHY;  Surgeon: Waynetta Sandy, MD;  Location: [place] CV LAB;  Service: Cardiovascular;  Laterality: N/A;  Bilateral    Allergies: Dextromethorphan-guaifenesin, Tocotrienols, and Losartan potassium  Medications: Prior to Admission medications   Medication Sig Start Date End Date Taking? Authorizing  Provider  acetaminophen (TYLENOL) 500 MG tablet Take 1,000 mg by mouth every 6 (six) hours as needed for moderate pain.   Yes [provider]  allopurinol (ZYLOPRIM) 100 MG tablet Take 1 tablet (100 mg total) by mouth See admin instructions. Take one tablet (100 mg) by mouth Tuesday, Thursday, Saturday after dialysis Patient taking differently: Take 100 mg by mouth See admin instructions. Take one tablet (100 mg) by mouth Mondays, Tuesday, Thursday, Saturday after dialysis 02/06/20  Yes Croitoru, Mihai, MD  amiodarone (PACERONE) 200 MG tablet Take 1 tablet (200 mg total) by mouth daily. Patient taking differently: Take 200 mg by mouth daily at 12 noon.  05/12/19  Yes Croitoru, Mihai, MD  bictegravir-emtricitabine-tenofovir AF ([name]) 50-200-25 MG TABS tablet Take 1 tablet by mouth daily. Patient taking differently: Take 1 tablet by mouth daily at 12 noon.  09/29/19  Yes Tommy Medal, Lavell Islam, MD  brimonidine (ALPHAGAN) 0.15 % ophthalmic solution Place 1 drop into the left eye 2 (two) times daily. 11/15/17  Yes [provider]  latanoprost (XALATAN) 0.005 % ophthalmic solution Place 1 drop into the left eye at bedtime.    Yes [provider]  loratadine (CLARITIN) 10 MG tablet Take 10 mg by mouth daily at 12 noon.    Yes [provider]  midodrine (PROAMATINE) 10 MG tablet Take 1 tablet (10 mg total) by mouth as directed. Twice a day on the day of HD Tuesday, Thursday, Saturday (1 in AM and 1 at [place]). Take 1/2 tablet (97m) a day on non HD days Patient taking differently: Take 10 mg by mouth See admin instructions. Take 20 mg in the morning of dialysis days and 10 mg half way through treatment on dialysis days (Mon, Tues, [place] and Sat) 04/22/19  Yes HLeonie Man MD  montelukast (SINGULAIR) 10 MG tablet Take 1 tablet (10 mg total) by mouth at bedtime. NEEDS OV/FOLLOW UP 02/05/20  Yes ODebbrah Alar NP  Multiple Vitamin (MULTIVITAMIN WITH MINERALS) TABS tablet  Take 1 tablet by mouth every evening.    Yes [provider]  polyethylene glycol (MIRALAX / GLYCOLAX) 17 g packet Take 17 g by mouth every evening.   Yes [provider]  sucroferric oxyhydroxide (VELPHORO) 500 MG chewable tablet Chew 500 mg by mouth 3 (three) times daily with meals.   Yes [provider]  warfarin (COUMADIN) 5 MG tablet Take 569mdaily for next 3days and then check INR 2/25, goal INR 2-3 Patient taking differently: Take 2.5-5 mg by mouth See admin instructions. Take  2.5 mg in the evening on Mon and Fri. Take 5 mg in the evening on Sun, Tue, Wed, Thur, and Sat 01/27/20  Yes Domenic Polite, MD  traMADol (ULTRAM) 50 MG tablet Take 1 tablet (50 mg total) by mouth every 6 (six) hours as needed. Patient not taking: Reported on 02/23/2020 01/27/20   Domenic Polite, MD     Family History  Problem Relation Age of Onset   Hypertension Mother    Thyroid disease Mother    Cholelithiasis Daughter    Cholelithiasis Son    Hypertension Maternal Grandmother    Diabetes Maternal Grandmother    Heart attack Neg Hx    Hyperlipidemia Neg Hx     Social History   Socioeconomic History   Marital status: Married    Spouse name: Not on file   Number of children: 2   Years of education: 12   Highest education level: Not on file  Occupational History   Occupation: retired, picks up Aeronautical engineer: RETIRED  Tobacco Use   Smoking status: Former Smoker    Packs/day: 1.00    Years: 30.00    Pack years: 30.00    Types: Cigarettes    Quit date: 2006    Years since quitting: 15.2   Smokeless tobacco: Never Used  Substance and Sexual Activity   Alcohol use: Yes    Alcohol/week: 1.0 standard drinks    Types: 1 Shots of liquor per week    Comment: occasional   Drug use: Never   Sexual activity: Not Currently    Comment: declined condoms 09/2019  Other Topics Concern   Not on file  Social History Narrative   ** Merged History Encounter **        Stays active at home Regular exercise: no Drinks 2 cups of coffee a week, 1 mountain dew soda a day.   Social Determinants of Health   Financial Resource Strain:    Difficulty of Paying Living Expenses:   Food Insecurity: No Food Insecurity   Worried About Charity fundraiser in the Last Year: Never true   Ran Out of Food in the Last Year: Never true  Transportation Needs: No Transportation Needs   Lack of Transportation (Medical): No   Lack of Transportation (Non-Medical): No  Physical Activity:    Days of Exercise per Week:    Minutes of Exercise per Session:   Stress:    Feeling of Stress :   Social Connections:    Frequency of Communication with Friends and Family:    Frequency of Social Gatherings with Friends and Family:    Attends Religious Services:    Active Member of Clubs or Organizations:    Attends Archivist Meetings:    Marital Status:    Review of Systems: A 12 point ROS discussed and pertinent positives are indicated in the HPI above.  All other systems are negative.  Review of Systems  Constitutional: Negative for fever.  HENT: Negative for congestion.   Respiratory: Negative for cough and shortness of breath.   Cardiovascular: Negative for chest pain.  Gastrointestinal: Negative for abdominal pain.  Neurological: Negative for headaches.  Psychiatric/Behavioral: Negative for behavioral problems and confusion.    Vital Signs: BP 104/65   Pulse 75   Temp 97.6 F (36.4 C) (Skin)   Resp 14   Ht _0  (1.88 m)   Wt 176 lb (79.8 kg)   SpO2 100%   BMI 22.60 kg/m  Physical Exam Vitals and nursing note reviewed.  Constitutional:      Appearance: He is well-developed.  HENT:     Head: Normocephalic.  Cardiovascular:     Rate and Rhythm: Normal rate and regular rhythm.     Heart sounds: Normal heart sounds.     Comments:  Right femoral graft -/-. RIJ perm cath present Pulmonary:     Effort: Pulmonary effort is normal.     Breath sounds:  Normal breath sounds.  Musculoskeletal:        General: Normal range of motion.     Cervical back: Normal range of motion.  Skin:    General: Skin is dry.  Neurological:     Mental Status: He is alert and oriented to person, place, and time.     Imaging: IR US Guide Vasc Access Right  Result Date: 01/26/2020 INDICATION: 79 year old male with a history of thrombosed right femoral loop graft EXAM: ULTRASOUND GUIDED ACCESS RIGHT FEMORAL LOOP GRAFT X2 MECHANICAL AND PHARMACOLOGIC THROMBECTOMY MEDICATIONS: None. ANESTHESIA/SEDATION: Moderate Sedation Time:  23 minutes The patient was continuously monitored during the procedure by the interventional radiology nurse under my direct supervision. FLUOROSCOPY TIME:  Fluoroscopy Time: 4 minutes 12 seconds (94 mGy). COMPLICATIONS: None PROCEDURE: The procedure, risks, benefits, and alternatives were explained to the patient and the patient's family, including bleeding, infection, arterial thrombus, venous thrombus, vessel injury, need for further procedure, need for stenting, contrast reaction, need for catheter placement, cardiopulmonary collapse, death. Questions regarding the procedure were encouraged and answered. The patient understands and consents to the procedure. Ultrasound survey was performed with images stored and sent to PACs. The right femoral/inguinal region prepped and draped in the usual sterile fashion. Ultrasound guidance was used to access the graft with a micropuncture kit. Exchange was made for a 7 [place] short sheath. A Bentson was then a navigate into the venous outflow, with an angled catheter advanced into the venous outflow. Venogram of the right iliac venous system demonstrates patency of the outflow. Upon withdrawal of the angled catheter, a solution of 3 milligrams of tPA in saline was infused into the outflow portion of the graft. A 6 mm balloon catheter was used to macerate the thrombosed segment. Balloon angioplasty of the venous  outflow was performed with 6 mm. Ultrasound guidance was then used access the graft directed toward the arterial anastomosis. Once the micro sheath was in place, an exchange was made for a 6 [place] short sheath. Glidewire was navigated across the arterial anastomosis, with an over the wire Fogarty balloon advanced into the artery. The arterial plug was pulled, and then a angiogram demonstrated a flow. Balloon catheter was advanced to the iliac artery and repeat angiogram was performed. No stenosis was identified with multiple obliquity. Repeat fistulogram was then performed. Repeat angioplasty of the venous outflow at the venous anastomosis performed with 8 mm balloon angioplasty. No waist identified. Catheters and wires were removed with a complete fistulagram performed through the central vasculature. No residual stenosis was identified in the venous outflow. No residual stenosis at the arterial anastomosis. The short sheaths were removed after placement of hemostasis sutures. Patient tolerated the procedure well and remained hemodynamically stable throughout. No complications were encountered and no significant blood loss was encountered. IMPRESSION: Status post ultrasound-guided access of right femoral loop graft for mechanical and pharmacologic thrombectomy and restoration of flow, with no venous outflow stenosis and no arterial stenosis identified. Signed, Dulcy Fanny. Dellia Nims, [place] [facility] Radiology  ACCESS: Early referral for duplex exam recommended to measure flow volumes, and evaluate for any possible occult stenosis by angiogram. Access amenable to usage and further intervention. Electronically Signed   By: Corrie Mckusick D.O.   On: 01/26/2020 15:55   IR THROMBECTOMY AV FISTULA/W THROMBOLYSIS/PTA INC SHUNT/IMG RIGHT  Result Date: 01/26/2020 INDICATION: 79 year old male with a history of thrombosed right femoral loop graft EXAM: ULTRASOUND GUIDED ACCESS  RIGHT FEMORAL LOOP GRAFT X2 MECHANICAL AND PHARMACOLOGIC THROMBECTOMY MEDICATIONS: None. ANESTHESIA/SEDATION: Moderate Sedation Time:  23 minutes The patient was continuously monitored during the procedure by the interventional radiology nurse under my direct supervision. FLUOROSCOPY TIME:  Fluoroscopy Time: 4 minutes 12 seconds (94 mGy). COMPLICATIONS: None PROCEDURE: The procedure, risks, benefits, and alternatives were explained to the patient and the patient's family, including bleeding, infection, arterial thrombus, venous thrombus, vessel injury, need for further procedure, need for stenting, contrast reaction, need for catheter placement, cardiopulmonary collapse, death. Questions regarding the procedure were encouraged and answered. The patient understands and consents to the procedure. Ultrasound survey was performed with images stored and sent to PACs. The right femoral/inguinal region prepped and draped in the usual sterile fashion. Ultrasound guidance was used to access the graft with a micropuncture kit. Exchange was made for a 7 [place] short sheath. A Bentson was then a navigate into the venous outflow, with an angled catheter advanced into the venous outflow. Venogram of the right iliac venous system demonstrates patency of the outflow. Upon withdrawal of the angled catheter, a solution of 3 milligrams of tPA in saline was infused into the outflow portion of the graft. A 6 mm balloon catheter was used to macerate the thrombosed segment. Balloon angioplasty of the venous outflow was performed with 6 mm. Ultrasound guidance was then used access the graft directed toward the arterial anastomosis. Once the micro sheath was in place, an exchange was made for a 6 [place] short sheath. Glidewire was navigated across the arterial anastomosis, with an over the wire Fogarty balloon advanced into the artery. The arterial plug was pulled, and then a angiogram demonstrated a flow. Balloon catheter was advanced to  the iliac artery and repeat angiogram was performed. No stenosis was identified with multiple obliquity. Repeat fistulogram was then performed. Repeat angioplasty of the venous outflow at the venous anastomosis performed with 8 mm balloon angioplasty. No waist identified. Catheters and wires were removed with a complete fistulagram performed through the central vasculature. No residual stenosis was identified in the venous outflow. No residual stenosis at the arterial anastomosis. The short sheaths were removed after placement of hemostasis sutures. Patient tolerated the procedure well and remained hemodynamically stable throughout. No complications were encountered and no significant blood loss was encountered. IMPRESSION: Status post ultrasound-guided access of right femoral loop graft for mechanical and pharmacologic thrombectomy and restoration of flow, with no venous outflow stenosis and no arterial stenosis identified. Signed, Dulcy Fanny. Dellia Nims, RPVI Vascular and Interventional Radiology Specialists [facility] Radiology ACCESS: Early referral for duplex exam recommended to measure flow volumes, and evaluate for any possible occult stenosis by angiogram. Access amenable to usage and further intervention. Electronically Signed   By: Corrie Mckusick D.O.   On: 01/26/2020 15:55     Labs:  CBC: Recent Labs    01/24/20 0712 01/25/20 0600 01/26/20 0338 01/27/20 0334  WBC 7.5 8.0 7.9 7.1  HGB 9.3* 9.3* 9.1* 9.2*  HCT 30.4* 30.6* 29.8* 30.5*  PLT 293 283 313 239    COAGS: Recent Labs  01/27/20 0334 01/29/20 0000 02/12/20 0000 02/24/20 1123  INR 1.5* 1.5* 2.8* 2.7*    BMP: Recent Labs    01/24/20 0712 01/25/20 0600 01/26/20 0338 01/27/20 0334  NA 142 140 138 136  K 3.6 4.6 4.1 3.9  CL 100 101 99 97*  CO2 _0 GLUCOSE 119* 99 95 84  BUN 20 15 25* 15  CALCIUM 9.6 9.0 9.0 8.9  CREATININE 8.57* 5.97* 7.38* 5.25*  GFRNONAA 5* 8* 6* 10*  GFRAA 6* 10* 7* 11*    LIVER  FUNCTION TESTS: Recent Labs    12/24/19 0706 12/31/19 0356 01/03/20 1508 01/09/20 0222 01/24/20 0712 01/25/20 0600  BILITOT 0.9  --   --  0.5 0.6 0.5  AST 16  --   --  _1 ALT 22  --   --  39 16 16  ALKPHOS 90  --   --  139* 149* 129*  PROT 5.2*  --   --  5.3* 6.1* 5.8*  ALBUMIN 2.4*   < > 2.2* 2.3* 2.9* 2.7*   < > = values in this interval not displayed.     Assessment and Plan:  79 y.o, male outpatient. History of ESRD s/p AV gore-rex graft to right femoral loop graft  12.29.20 with previous graft to left thigh (12.7.21 revised with thromebecotmy on 12.14.20),  right arm (11.6.19 revised on 8.20 and 8.10.20)  and  left arm (3.25.19 and 11.6.19) and right brachopcepahlic  AV fistula (9.6.22) and perm cath placed on 12.22.21. Team is requesting fistulogram due to low flow and clotting with access. Patient states that they have been accessing his perm cath for dialysis and states the has had no flow in the right femoral graft for "a while".   Pertinent Imaging none  Pertinent IR History 2.22.21 - RIJ perm cath placement 2.22.21 - Fistulogram Status post ultrasound-guided access of right femoral loop graft for mechanical and pharmacologic thrombectomy and restoration of flow, with no venous outflow stenosis and no arterial stenosis identified. 6 mm balloon used  Pertinent Allergies none  All labs and medications are within acceptable parameters.  Patient is afebrile.  Risks and benefits discussed with the patient including, but not limited to bleeding, infection, vascular injury, pulmonary embolism, need for tunneled HD catheter placement or even death.  All of the patient's questions were answered, patient is agreeable to proceed.  Consent signed and in chart.    Thank you for this interesting consult.  I greatly enjoyed meeting [facility] and look forward to participating in their care.  A copy of this report was sent to the requesting provider on this  date.  Electronically Signed: Avel Peace, NP 02/24/2020, 12:24 PM   I spent a total of 40 Minutes    in face to face in clinical consultation, greater than 50% of which was counseling/coordinating care for Fistulogram

## 2020-02-24 NOTE — Procedures (Signed)
Interventional Radiology Procedure Note  Procedure:   US guided access of right femoral loop graft, x2  Mechanical and pharmacologic thrombectomy.    Restoration of the flow through the loop graft.  Pulse and thrill restored.  .  Complications: None  Recommendations:  - Ok to shower tomorrow - Do not submerge for 7 days - Routine care  - Needs duplex to identify target for any stent/plasty and/or sx revision.  - DC home 1 hr  Signed,  Dulcy Fanny. Earleen Newport, DO

## 2020-02-24 NOTE — Discharge Instructions (Addendum)
Dialysis Vascular Access Malfunction        A vascular access is an entrance to your blood vessels that can be used for dialysis. Dialysis is a treatment used for kidney failure. A health care provider can make a vascular access in many ways, such as by:  Joining an artery to a vein under your skin to make a bigger blood vessel called a fistula.  Joining an artery to a vein under your skin using a soft tube called a graft.  Placing a thin, flexible tube (catheter) in a large vein, usually in your neck, chest, or groin. A vascular access can become blocked or stop working correctly (malfunction). What can cause my vascular access to malfunction?  Infection. This is the most common cause of malfunction.  A blood clot inside a part of the fistula, graft, or catheter. A blood clot can completely or partially block the flow of blood.  A kink in the graft or catheter.  A collection of blood (hematoma or bruise) next to the graft or catheter that pushes against it, blocking the flow of blood. What are signs and symptoms of vascular access malfunction? Signs and symptoms of vascular access malfunction include:  A change in the vibration or pulse (thrill) of your fistula or graft.  The thrill of your fistula or graft being gone.  New or unusual swelling of the area around the access.  An unsuccessful puncture of your access by the dialysis team.  The flow of blood through the fistula, graft, or catheter being too slow for effective dialysis.  Bleeding that cannot be easily controlled when routine dialysis is completed and the needle is removed.  Signs of infection such as pain, swelling, redness, red streaks, numbness, and blood or pus coming from or around the access. What happens if my vascular access malfunctions? If your vascular access malfunctions, your health care provider may order blood work, cultures, or an X-ray test to find out what went wrong. The X-ray test involves the  injection of a dye (contrast) into the vascular access. The contrast shows up on the X-ray and lets your health care provider see if there is a blockage in the vascular access. Treatment varies depending on the cause of the malfunction:  If the vascular access is infected, your health care provider may prescribe antibiotic medicine to control the infection.  If a clot is found in the vascular access, you may need surgery to remove the clot. Blood thinning medicines may also be used.  If a blockage in the vascular access is due to some other cause, such as a kink in a graft, you will likely need surgery to unblock or replace the graft.  If there is a malfunction for any reason, your health care provider may remove and replace your access. Follow these instructions at home: Medicines  Take over-the-counter and prescription medicines only as told by your health care provider.  If you were prescribed an antibiotic medicine, use it as told by your health care provider. Do not stop using the antibiotic even if you start to feel better. Activity  Do not lift heavy things with the arm that has the access.  Try not to bump or hurt the arm that has the access. Lifestyle  Do not wear jewelry or tight clothing around the access.  Do not sleep by placing the access arm under your head or body. General instructions  Wash your hands with soap and water before touching the area around the  vascular access. If soap and water are not available, use hand sanitizer.  Keep all follow-up visits as told by your health care provider. This is very important. Any delay in follow-up could cause permanent dysfunction of the vascular access, which may be dangerous.  Do not measure your blood pressure on the arm that has the access.  Keep the access area clean and do not use makeup, creams, lotions, or ointments on it.  Check your access area every day for signs of infection. Check for: ? More redness,  swelling, or pain. ? More fluid or blood. ? Warmth. ? Pus or a bad smell. Contact a health care provider if:  Swelling around the vascular access gets worse.  You develop new pain. Get help right away if:  You have bleeding at the vascular access that cannot be easily controlled.  You have pain, numbness, an unusual pale skin color, or blue fingers or sores at the tips of your fingers in the hand on the side of your fistula.  You have chills.  You have a fever.  You have pus or other fluid (drainage) at the vascular access site.  You develop skin redness or red streaking on the skin around, above, or below the vascular access.  Your access is hot, swollen, red, and very painful.  You can suddenly see the cuff of your catheter used in the access. Summary  A vascular access is an entrance to your blood vessels that can be used for dialysis.  Several things can cause your vascular access to malfunction.  Treatment varies depending on the cause of the malfunction.  Keep all follow-up visits as told by your health care provider. Any delay in follow-up could cause permanent dysfunction of the vascular access, which may be dangerous. This information is not intended to replace advice given to you by your health care provider. Make sure you discuss any questions you have with your health care provider. Document Revised: 02/15/2018 Document Reviewed: 12/15/2016 Elsevier Patient Education  [place]. Moderate Conscious Sedation, Adult Sedation is the use of medicines to promote relaxation and relieve discomfort and anxiety. Moderate conscious sedation is a type of sedation. Under moderate conscious sedation, you are less alert than normal, but you are still able to respond to instructions, touch, or both. Moderate conscious sedation is used during short medical and dental procedures. It is milder than deep sedation, which is a type of sedation under which you cannot be easily  woken up. It is also milder than general anesthesia, which is the use of medicines to make you unconscious. Moderate conscious sedation allows you to return to your regular activities sooner. Tell a health care provider about:  Any allergies you have.  All medicines you are taking, including vitamins, herbs, eye drops, creams, and over-the-counter medicines.  Use of steroids (by mouth or creams).  Any problems you or family members have had with sedatives and anesthetic medicines.  Any blood disorders you have.  Any surgeries you have had.  Any medical conditions you have, such as sleep apnea.  Whether you are pregnant or may be pregnant.  Any use of cigarettes, alcohol, marijuana, or street drugs. What are the risks? Generally, this is a safe procedure. However, problems may occur, including:  Getting too much medicine (oversedation).  Nausea.  Allergic reaction to medicines.  Trouble breathing. If this happens, a breathing tube may be used to help with breathing. It will be removed when you are awake and breathing on  your own.  Heart trouble.  Lung trouble. What happens before the procedure? Staying hydrated Follow instructions from your health care provider about hydration, which may include:  Up to 2 hours before the procedure - you may continue to drink clear liquids, such as water, clear fruit juice, black coffee, and plain tea. Eating and drinking restrictions Follow instructions from your health care provider about eating and drinking, which may include:  8 hours before the procedure - stop eating heavy meals or foods such as meat, fried foods, or fatty foods.  6 hours before the procedure - stop eating light meals or foods, such as toast or cereal.  6 hours before the procedure - stop drinking milk or drinks that contain milk.  2 hours before the procedure - stop drinking clear liquids. Medicine Ask your health care provider about:  Changing or stopping  your regular medicines. This is especially important if you are taking diabetes medicines or blood thinners.  Taking medicines such as aspirin and ibuprofen. These medicines can thin your blood. Do not take these medicines before your procedure if your health care provider instructs you not to.  Tests and exams  You will have a physical exam.  You may have blood tests done to show: ? How well your kidneys and liver are working. ? How well your blood can clot. General instructions  Plan to have someone take you home from the hospital or clinic.  If you will be going home right after the procedure, plan to have someone with you for 24 hours. What happens during the procedure?  An IV tube will be inserted into one of your veins.  Medicine to help you relax (sedative) will be given through the IV tube.  The medical or dental procedure will be performed. What happens after the procedure?  Your blood pressure, heart rate, breathing rate, and blood oxygen level will be monitored often until the medicines you were given have worn off.  Do not drive for 24 hours. This information is not intended to replace advice given to you by your health care provider. Make sure you discuss any questions you have with your health care provider. Document Revised: 11/02/2017 Document Reviewed: 03/11/2016 Elsevier Patient Education  2020 [employer].

## 2020-02-24 NOTE — Sedation Documentation (Signed)
Palpable thrill noted at this time

## 2020-02-26 DIAGNOSIS — N2581 Secondary hyperparathyroidism of renal origin: Secondary | ICD-10-CM | POA: Diagnosis not present

## 2020-02-26 DIAGNOSIS — E8770 Fluid overload, unspecified: Secondary | ICD-10-CM | POA: Diagnosis not present

## 2020-02-26 DIAGNOSIS — Z992 Dependence on renal dialysis: Secondary | ICD-10-CM | POA: Diagnosis not present

## 2020-02-26 DIAGNOSIS — N186 End stage renal disease: Secondary | ICD-10-CM | POA: Diagnosis not present

## 2020-02-26 LAB — PROTIME-INR: INR: 4.2 — AB (ref 0.9–1.1)

## 2020-02-26 LAB — POCT INR: INR: 4.2 — AB (ref 2.0–3.0)

## 2020-02-28 DIAGNOSIS — N186 End stage renal disease: Secondary | ICD-10-CM | POA: Diagnosis not present

## 2020-02-28 DIAGNOSIS — N2581 Secondary hyperparathyroidism of renal origin: Secondary | ICD-10-CM | POA: Diagnosis not present

## 2020-02-28 DIAGNOSIS — E8770 Fluid overload, unspecified: Secondary | ICD-10-CM | POA: Diagnosis not present

## 2020-02-28 DIAGNOSIS — Z992 Dependence on renal dialysis: Secondary | ICD-10-CM | POA: Diagnosis not present

## 2020-03-01 ENCOUNTER — Ambulatory Visit (INDEPENDENT_AMBULATORY_CARE_PROVIDER_SITE_OTHER): Payer: Medicare HMO | Admitting: Internal Medicine

## 2020-03-01 DIAGNOSIS — I482 Chronic atrial fibrillation, unspecified: Secondary | ICD-10-CM

## 2020-03-01 DIAGNOSIS — Z992 Dependence on renal dialysis: Secondary | ICD-10-CM | POA: Diagnosis not present

## 2020-03-01 DIAGNOSIS — N186 End stage renal disease: Secondary | ICD-10-CM | POA: Diagnosis not present

## 2020-03-01 DIAGNOSIS — N2581 Secondary hyperparathyroidism of renal origin: Secondary | ICD-10-CM | POA: Diagnosis not present

## 2020-03-01 DIAGNOSIS — Z515 Encounter for palliative care: Secondary | ICD-10-CM | POA: Diagnosis not present

## 2020-03-01 DIAGNOSIS — E8770 Fluid overload, unspecified: Secondary | ICD-10-CM | POA: Diagnosis not present

## 2020-03-02 DIAGNOSIS — N186 End stage renal disease: Secondary | ICD-10-CM | POA: Diagnosis not present

## 2020-03-02 DIAGNOSIS — N2581 Secondary hyperparathyroidism of renal origin: Secondary | ICD-10-CM | POA: Diagnosis not present

## 2020-03-02 DIAGNOSIS — Z992 Dependence on renal dialysis: Secondary | ICD-10-CM | POA: Diagnosis not present

## 2020-03-02 DIAGNOSIS — E8770 Fluid overload, unspecified: Secondary | ICD-10-CM | POA: Diagnosis not present

## 2020-03-03 DIAGNOSIS — Z992 Dependence on renal dialysis: Secondary | ICD-10-CM | POA: Diagnosis not present

## 2020-03-03 DIAGNOSIS — B20 Human immunodeficiency virus [HIV] disease: Secondary | ICD-10-CM | POA: Diagnosis not present

## 2020-03-03 DIAGNOSIS — N186 End stage renal disease: Secondary | ICD-10-CM | POA: Diagnosis not present

## 2020-03-04 DIAGNOSIS — N2581 Secondary hyperparathyroidism of renal origin: Secondary | ICD-10-CM | POA: Diagnosis not present

## 2020-03-04 DIAGNOSIS — Z992 Dependence on renal dialysis: Secondary | ICD-10-CM | POA: Diagnosis not present

## 2020-03-04 DIAGNOSIS — E8770 Fluid overload, unspecified: Secondary | ICD-10-CM | POA: Diagnosis not present

## 2020-03-04 DIAGNOSIS — N186 End stage renal disease: Secondary | ICD-10-CM | POA: Diagnosis not present

## 2020-03-06 DIAGNOSIS — N2581 Secondary hyperparathyroidism of renal origin: Secondary | ICD-10-CM | POA: Diagnosis not present

## 2020-03-06 DIAGNOSIS — N186 End stage renal disease: Secondary | ICD-10-CM | POA: Diagnosis not present

## 2020-03-06 DIAGNOSIS — E8770 Fluid overload, unspecified: Secondary | ICD-10-CM | POA: Diagnosis not present

## 2020-03-06 DIAGNOSIS — Z992 Dependence on renal dialysis: Secondary | ICD-10-CM | POA: Diagnosis not present

## 2020-03-08 DIAGNOSIS — N186 End stage renal disease: Secondary | ICD-10-CM | POA: Diagnosis not present

## 2020-03-08 DIAGNOSIS — Z992 Dependence on renal dialysis: Secondary | ICD-10-CM | POA: Diagnosis not present

## 2020-03-08 DIAGNOSIS — N2581 Secondary hyperparathyroidism of renal origin: Secondary | ICD-10-CM | POA: Diagnosis not present

## 2020-03-08 DIAGNOSIS — E8770 Fluid overload, unspecified: Secondary | ICD-10-CM | POA: Diagnosis not present

## 2020-03-09 DIAGNOSIS — Z992 Dependence on renal dialysis: Secondary | ICD-10-CM | POA: Diagnosis not present

## 2020-03-09 DIAGNOSIS — N186 End stage renal disease: Secondary | ICD-10-CM | POA: Diagnosis not present

## 2020-03-09 DIAGNOSIS — N2581 Secondary hyperparathyroidism of renal origin: Secondary | ICD-10-CM | POA: Diagnosis not present

## 2020-03-09 DIAGNOSIS — E8770 Fluid overload, unspecified: Secondary | ICD-10-CM | POA: Diagnosis not present

## 2020-03-10 ENCOUNTER — Telehealth: Payer: Self-pay | Admitting: Family

## 2020-03-10 ENCOUNTER — Telehealth: Payer: Self-pay

## 2020-03-10 NOTE — Telephone Encounter (Signed)
Pt's thigh graft has clotted again per Aldona Bar at [facility]. Per Dr. Donzetta Matters, pt should go back to IR for another attempt. Aldona Bar will set this up.

## 2020-03-10 NOTE — Telephone Encounter (Signed)
CALLER: Yoel Kaufhold   Call Back # [phone]   Per patient, Lenna Sciara is suppose to refer patient to a throat doctor, no word yet on the appointment. Patient would like an update    Please advise

## 2020-03-11 DIAGNOSIS — N186 End stage renal disease: Secondary | ICD-10-CM | POA: Diagnosis not present

## 2020-03-11 DIAGNOSIS — Z992 Dependence on renal dialysis: Secondary | ICD-10-CM | POA: Diagnosis not present

## 2020-03-11 DIAGNOSIS — N2581 Secondary hyperparathyroidism of renal origin: Secondary | ICD-10-CM | POA: Diagnosis not present

## 2020-03-11 DIAGNOSIS — E8770 Fluid overload, unspecified: Secondary | ICD-10-CM | POA: Diagnosis not present

## 2020-03-11 NOTE — Telephone Encounter (Signed)
Patient was advised the referral was made to Rudolpho Sevin, phone number provided for him to call and inquire about the appointment.

## 2020-03-13 DIAGNOSIS — N2581 Secondary hyperparathyroidism of renal origin: Secondary | ICD-10-CM | POA: Diagnosis not present

## 2020-03-13 DIAGNOSIS — E8770 Fluid overload, unspecified: Secondary | ICD-10-CM | POA: Diagnosis not present

## 2020-03-13 DIAGNOSIS — N186 End stage renal disease: Secondary | ICD-10-CM | POA: Diagnosis not present

## 2020-03-13 DIAGNOSIS — Z992 Dependence on renal dialysis: Secondary | ICD-10-CM | POA: Diagnosis not present

## 2020-03-15 DIAGNOSIS — N2581 Secondary hyperparathyroidism of renal origin: Secondary | ICD-10-CM | POA: Diagnosis not present

## 2020-03-15 DIAGNOSIS — E8770 Fluid overload, unspecified: Secondary | ICD-10-CM | POA: Diagnosis not present

## 2020-03-15 DIAGNOSIS — N186 End stage renal disease: Secondary | ICD-10-CM | POA: Diagnosis not present

## 2020-03-15 DIAGNOSIS — Z992 Dependence on renal dialysis: Secondary | ICD-10-CM | POA: Diagnosis not present

## 2020-03-16 DIAGNOSIS — N2581 Secondary hyperparathyroidism of renal origin: Secondary | ICD-10-CM | POA: Diagnosis not present

## 2020-03-16 DIAGNOSIS — E8770 Fluid overload, unspecified: Secondary | ICD-10-CM | POA: Diagnosis not present

## 2020-03-16 DIAGNOSIS — Z992 Dependence on renal dialysis: Secondary | ICD-10-CM | POA: Diagnosis not present

## 2020-03-16 DIAGNOSIS — N186 End stage renal disease: Secondary | ICD-10-CM | POA: Diagnosis not present

## 2020-03-18 DIAGNOSIS — N2581 Secondary hyperparathyroidism of renal origin: Secondary | ICD-10-CM | POA: Diagnosis not present

## 2020-03-18 DIAGNOSIS — E8770 Fluid overload, unspecified: Secondary | ICD-10-CM | POA: Diagnosis not present

## 2020-03-18 DIAGNOSIS — N186 End stage renal disease: Secondary | ICD-10-CM | POA: Diagnosis not present

## 2020-03-18 DIAGNOSIS — Z992 Dependence on renal dialysis: Secondary | ICD-10-CM | POA: Diagnosis not present

## 2020-03-20 DIAGNOSIS — E8770 Fluid overload, unspecified: Secondary | ICD-10-CM | POA: Diagnosis not present

## 2020-03-20 DIAGNOSIS — Z992 Dependence on renal dialysis: Secondary | ICD-10-CM | POA: Diagnosis not present

## 2020-03-20 DIAGNOSIS — N186 End stage renal disease: Secondary | ICD-10-CM | POA: Diagnosis not present

## 2020-03-20 DIAGNOSIS — N2581 Secondary hyperparathyroidism of renal origin: Secondary | ICD-10-CM | POA: Diagnosis not present

## 2020-03-22 DIAGNOSIS — N186 End stage renal disease: Secondary | ICD-10-CM | POA: Diagnosis not present

## 2020-03-22 DIAGNOSIS — Z992 Dependence on renal dialysis: Secondary | ICD-10-CM | POA: Diagnosis not present

## 2020-03-22 DIAGNOSIS — E8770 Fluid overload, unspecified: Secondary | ICD-10-CM | POA: Diagnosis not present

## 2020-03-22 DIAGNOSIS — N2581 Secondary hyperparathyroidism of renal origin: Secondary | ICD-10-CM | POA: Diagnosis not present

## 2020-03-23 ENCOUNTER — Other Ambulatory Visit: Payer: Self-pay

## 2020-03-23 DIAGNOSIS — E8770 Fluid overload, unspecified: Secondary | ICD-10-CM | POA: Diagnosis not present

## 2020-03-23 DIAGNOSIS — N186 End stage renal disease: Secondary | ICD-10-CM | POA: Diagnosis not present

## 2020-03-23 DIAGNOSIS — N2581 Secondary hyperparathyroidism of renal origin: Secondary | ICD-10-CM | POA: Diagnosis not present

## 2020-03-23 DIAGNOSIS — Z992 Dependence on renal dialysis: Secondary | ICD-10-CM | POA: Diagnosis not present

## 2020-03-23 MED ORDER — MONTELUKAST SODIUM 10 MG PO TABS: 10.0000 mg | ORAL_TABLET | Freq: Every day | ORAL | 0 refills | Status: DC

## 2020-03-25 ENCOUNTER — Telehealth: Payer: Self-pay | Admitting: Cardiovascular Disease

## 2020-03-25 DIAGNOSIS — N2581 Secondary hyperparathyroidism of renal origin: Secondary | ICD-10-CM | POA: Diagnosis not present

## 2020-03-25 DIAGNOSIS — N186 End stage renal disease: Secondary | ICD-10-CM | POA: Diagnosis not present

## 2020-03-25 DIAGNOSIS — Z992 Dependence on renal dialysis: Secondary | ICD-10-CM | POA: Diagnosis not present

## 2020-03-25 DIAGNOSIS — E8770 Fluid overload, unspecified: Secondary | ICD-10-CM | POA: Diagnosis not present

## 2020-03-25 MED ORDER — ALLOPURINOL 100 MG PO TABS: 100.0000 mg | ORAL_TABLET | ORAL | 1 refills | Status: DC

## 2020-03-25 NOTE — Telephone Encounter (Signed)
Please refill.

## 2020-03-25 NOTE — Telephone Encounter (Signed)
Pt called and said he has been out of medication for 3 days and needs the office to send a refill asap.

## 2020-03-25 NOTE — Telephone Encounter (Signed)
*  STAT* If patient is at the pharmacy, call can be transferred to refill team.   1. Which medications need to be refilled? (please list name of each medication and dose if known) Allopurinol  2. Which pharmacy/location (including street and city if local pharmacy) is medication to be sent to?  [facility] [place], [place]   3. Do they need a 30 day or 90 day supply? 90 days and refills

## 2020-03-25 NOTE — Telephone Encounter (Signed)
Rx(s) sent to pharmacy electronically.  

## 2020-03-27 DIAGNOSIS — N186 End stage renal disease: Secondary | ICD-10-CM | POA: Diagnosis not present

## 2020-03-27 DIAGNOSIS — E8770 Fluid overload, unspecified: Secondary | ICD-10-CM | POA: Diagnosis not present

## 2020-03-27 DIAGNOSIS — N2581 Secondary hyperparathyroidism of renal origin: Secondary | ICD-10-CM | POA: Diagnosis not present

## 2020-03-27 DIAGNOSIS — Z992 Dependence on renal dialysis: Secondary | ICD-10-CM | POA: Diagnosis not present

## 2020-03-29 DIAGNOSIS — E8770 Fluid overload, unspecified: Secondary | ICD-10-CM | POA: Diagnosis not present

## 2020-03-29 DIAGNOSIS — Z992 Dependence on renal dialysis: Secondary | ICD-10-CM | POA: Diagnosis not present

## 2020-03-29 DIAGNOSIS — N2581 Secondary hyperparathyroidism of renal origin: Secondary | ICD-10-CM | POA: Diagnosis not present

## 2020-03-29 DIAGNOSIS — N186 End stage renal disease: Secondary | ICD-10-CM | POA: Diagnosis not present

## 2020-03-30 DIAGNOSIS — H44422 Hypotony of left eye due to ocular fistula: Secondary | ICD-10-CM | POA: Diagnosis not present

## 2020-03-30 DIAGNOSIS — H04123 Dry eye syndrome of bilateral lacrimal glands: Secondary | ICD-10-CM | POA: Diagnosis not present

## 2020-03-30 DIAGNOSIS — Z992 Dependence on renal dialysis: Secondary | ICD-10-CM | POA: Diagnosis not present

## 2020-03-30 DIAGNOSIS — N186 End stage renal disease: Secondary | ICD-10-CM | POA: Diagnosis not present

## 2020-03-30 DIAGNOSIS — E8770 Fluid overload, unspecified: Secondary | ICD-10-CM | POA: Diagnosis not present

## 2020-03-30 DIAGNOSIS — N2581 Secondary hyperparathyroidism of renal origin: Secondary | ICD-10-CM | POA: Diagnosis not present

## 2020-03-30 DIAGNOSIS — Z961 Presence of intraocular lens: Secondary | ICD-10-CM | POA: Diagnosis not present

## 2020-03-30 DIAGNOSIS — H401133 Primary open-angle glaucoma, bilateral, severe stage: Secondary | ICD-10-CM | POA: Diagnosis not present

## 2020-04-01 DIAGNOSIS — N2581 Secondary hyperparathyroidism of renal origin: Secondary | ICD-10-CM | POA: Diagnosis not present

## 2020-04-01 DIAGNOSIS — Z992 Dependence on renal dialysis: Secondary | ICD-10-CM | POA: Diagnosis not present

## 2020-04-01 DIAGNOSIS — E8770 Fluid overload, unspecified: Secondary | ICD-10-CM | POA: Diagnosis not present

## 2020-04-01 DIAGNOSIS — N186 End stage renal disease: Secondary | ICD-10-CM | POA: Diagnosis not present

## 2020-04-02 DIAGNOSIS — N186 End stage renal disease: Secondary | ICD-10-CM | POA: Diagnosis not present

## 2020-04-02 DIAGNOSIS — B20 Human immunodeficiency virus [HIV] disease: Secondary | ICD-10-CM | POA: Diagnosis not present

## 2020-04-02 DIAGNOSIS — Z992 Dependence on renal dialysis: Secondary | ICD-10-CM | POA: Diagnosis not present

## 2020-04-03 DIAGNOSIS — N2581 Secondary hyperparathyroidism of renal origin: Secondary | ICD-10-CM | POA: Diagnosis not present

## 2020-04-03 DIAGNOSIS — Z992 Dependence on renal dialysis: Secondary | ICD-10-CM | POA: Diagnosis not present

## 2020-04-03 DIAGNOSIS — N186 End stage renal disease: Secondary | ICD-10-CM | POA: Diagnosis not present

## 2020-04-03 DIAGNOSIS — E8770 Fluid overload, unspecified: Secondary | ICD-10-CM | POA: Diagnosis not present

## 2020-04-05 DIAGNOSIS — E8770 Fluid overload, unspecified: Secondary | ICD-10-CM | POA: Diagnosis not present

## 2020-04-05 DIAGNOSIS — N2581 Secondary hyperparathyroidism of renal origin: Secondary | ICD-10-CM | POA: Diagnosis not present

## 2020-04-05 DIAGNOSIS — N186 End stage renal disease: Secondary | ICD-10-CM | POA: Diagnosis not present

## 2020-04-05 DIAGNOSIS — Z992 Dependence on renal dialysis: Secondary | ICD-10-CM | POA: Diagnosis not present

## 2020-04-06 ENCOUNTER — Other Ambulatory Visit (HOSPITAL_COMMUNITY): Payer: Self-pay | Admitting: Nephrology

## 2020-04-06 DIAGNOSIS — Z992 Dependence on renal dialysis: Secondary | ICD-10-CM | POA: Diagnosis not present

## 2020-04-06 DIAGNOSIS — N186 End stage renal disease: Secondary | ICD-10-CM | POA: Diagnosis not present

## 2020-04-06 DIAGNOSIS — N2581 Secondary hyperparathyroidism of renal origin: Secondary | ICD-10-CM | POA: Diagnosis not present

## 2020-04-06 DIAGNOSIS — E8770 Fluid overload, unspecified: Secondary | ICD-10-CM | POA: Diagnosis not present

## 2020-04-07 ENCOUNTER — Other Ambulatory Visit: Payer: Self-pay | Admitting: Radiology

## 2020-04-07 ENCOUNTER — Other Ambulatory Visit: Payer: Self-pay

## 2020-04-07 ENCOUNTER — Other Ambulatory Visit (HOSPITAL_COMMUNITY): Payer: Self-pay | Admitting: Nephrology

## 2020-04-07 ENCOUNTER — Ambulatory Visit (HOSPITAL_COMMUNITY)
Admission: RE | Admit: 2020-04-07 | Discharge: 2020-04-07 | Disposition: A | Discharge status: Home / Self Care | Payer: Medicare HMO | Source: Ambulatory Visit | Attending: Nephrology | Admitting: Nephrology

## 2020-04-07 ENCOUNTER — Encounter (HOSPITAL_COMMUNITY): Payer: Self-pay

## 2020-04-07 DIAGNOSIS — Z8546 Personal history of malignant neoplasm of prostate: Secondary | ICD-10-CM | POA: Insufficient documentation

## 2020-04-07 DIAGNOSIS — N186 End stage renal disease: Secondary | ICD-10-CM | POA: Diagnosis not present

## 2020-04-07 DIAGNOSIS — I5042 Chronic combined systolic (congestive) and diastolic (congestive) heart failure: Secondary | ICD-10-CM | POA: Diagnosis not present

## 2020-04-07 DIAGNOSIS — I132 Hypertensive heart and chronic kidney disease with heart failure and with stage 5 chronic kidney disease, or end stage renal disease: Secondary | ICD-10-CM | POA: Diagnosis not present

## 2020-04-07 DIAGNOSIS — Z7901 Long term (current) use of anticoagulants: Secondary | ICD-10-CM | POA: Diagnosis not present

## 2020-04-07 DIAGNOSIS — G4730 Sleep apnea, unspecified: Secondary | ICD-10-CM | POA: Diagnosis not present

## 2020-04-07 DIAGNOSIS — Z8673 Personal history of transient ischemic attack (TIA), and cerebral infarction without residual deficits: Secondary | ICD-10-CM | POA: Diagnosis not present

## 2020-04-07 DIAGNOSIS — T82868A Thrombosis of vascular prosthetic devices, implants and grafts, initial encounter: Secondary | ICD-10-CM | POA: Diagnosis not present

## 2020-04-07 DIAGNOSIS — K219 Gastro-esophageal reflux disease without esophagitis: Secondary | ICD-10-CM | POA: Diagnosis not present

## 2020-04-07 DIAGNOSIS — I428 Other cardiomyopathies: Secondary | ICD-10-CM | POA: Insufficient documentation

## 2020-04-07 DIAGNOSIS — M109 Gout, unspecified: Secondary | ICD-10-CM | POA: Insufficient documentation

## 2020-04-07 DIAGNOSIS — Z79899 Other long term (current) drug therapy: Secondary | ICD-10-CM | POA: Diagnosis not present

## 2020-04-07 DIAGNOSIS — Z21 Asymptomatic human immunodeficiency virus [HIV] infection status: Secondary | ICD-10-CM | POA: Insufficient documentation

## 2020-04-07 DIAGNOSIS — Z992 Dependence on renal dialysis: Secondary | ICD-10-CM | POA: Diagnosis not present

## 2020-04-07 DIAGNOSIS — I4891 Unspecified atrial fibrillation: Secondary | ICD-10-CM | POA: Diagnosis not present

## 2020-04-07 DIAGNOSIS — I252 Old myocardial infarction: Secondary | ICD-10-CM | POA: Insufficient documentation

## 2020-04-07 DIAGNOSIS — Y841 Kidney dialysis as the cause of abnormal reaction of the patient, or of later complication, without mention of misadventure at the time of the procedure: Secondary | ICD-10-CM | POA: Diagnosis not present

## 2020-04-07 DIAGNOSIS — Z87891 Personal history of nicotine dependence: Secondary | ICD-10-CM | POA: Insufficient documentation

## 2020-04-07 DIAGNOSIS — E785 Hyperlipidemia, unspecified: Secondary | ICD-10-CM | POA: Diagnosis not present

## 2020-04-07 DIAGNOSIS — T82858A Stenosis of vascular prosthetic devices, implants and grafts, initial encounter: Secondary | ICD-10-CM | POA: Diagnosis not present

## 2020-04-07 HISTORY — PX: IR US GUIDE VASC ACCESS RIGHT: IMG2390

## 2020-04-07 HISTORY — PX: IR THROMBECTOMY AV FISTULA W/THROMBOLYSIS/PTA INC/SHUNT/IMG RIGHT: IMG6119

## 2020-04-07 LAB — BASIC METABOLIC PANEL
Anion gap: 15 (ref 5–15)
BUN: 25 mg/dL — ABNORMAL HIGH (ref 8–23)
CO2: 32 mmol/L (ref 22–32)
Calcium: 9.4 mg/dL (ref 8.9–10.3)
Chloride: 94 mmol/L — ABNORMAL LOW (ref 98–111)
Creatinine, Ser: 6.92 mg/dL — ABNORMAL HIGH (ref 0.61–1.24)
GFR calc Af Amer: 8 mL/min — ABNORMAL LOW (ref >60)
GFR calc non Af Amer: 7 mL/min — ABNORMAL LOW (ref >60)
Glucose, Bld: 97 mg/dL (ref 70–99)
Potassium: 4.7 mmol/L (ref 3.5–5.1)
Sodium: 141 mmol/L (ref 135–145)

## 2020-04-07 MED ORDER — ALTEPLASE 2 MG IJ SOLR
INTRAMUSCULAR | Status: AC | PRN
  Administered 2020-04-07: 2 mg

## 2020-04-07 MED ORDER — ALTEPLASE 2 MG IJ SOLR
INTRAMUSCULAR | Status: AC
  Filled 2020-04-07: qty 2

## 2020-04-07 MED ORDER — LIDOCAINE HCL 1 % IJ SOLN
INTRAMUSCULAR | Status: AC
  Filled 2020-04-07: qty 20

## 2020-04-07 MED ORDER — FENTANYL CITRATE (PF) 100 MCG/2ML IJ SOLN
INTRAMUSCULAR | Status: AC
  Filled 2020-04-07: qty 2

## 2020-04-07 MED ORDER — SODIUM CHLORIDE 0.9 % IV SOLN: INTRAVENOUS | Status: DC

## 2020-04-07 MED ORDER — IOHEXOL 300 MG/ML  SOLN
100.0000 mL | Freq: Once | INTRAMUSCULAR | Status: AC | PRN
  Administered 2020-04-07: 42 mL via INTRAVENOUS

## 2020-04-07 MED ORDER — LIDOCAINE HCL (PF) 1 % IJ SOLN
INTRAMUSCULAR | Status: AC | PRN
  Administered 2020-04-07: 10 mL

## 2020-04-07 MED ORDER — HYDROCODONE-ACETAMINOPHEN 5-325 MG PO TABS: 1.0000 | ORAL_TABLET | ORAL | Status: DC | PRN

## 2020-04-07 MED ORDER — MIDAZOLAM HCL 2 MG/2ML IJ SOLN
INTRAMUSCULAR | Status: AC | PRN
  Administered 2020-04-07: 0.5 mg via INTRAVENOUS

## 2020-04-07 MED ORDER — MIDAZOLAM HCL 2 MG/2ML IJ SOLN
INTRAMUSCULAR | Status: AC
  Filled 2020-04-07: qty 2

## 2020-04-07 MED ORDER — FENTANYL CITRATE (PF) 100 MCG/2ML IJ SOLN
INTRAMUSCULAR | Status: AC | PRN
  Administered 2020-04-07: 25 ug via INTRAVENOUS

## 2020-04-07 NOTE — Procedures (Signed)
  Procedure: R fem HD graft declot and venous PTA   EBL:   minimal Complications:  none immediate  See full dictation in [facility].  Dillard Cannon MD Main # [phone] Pager  [phone]

## 2020-04-07 NOTE — Sedation Documentation (Signed)
Notified Dr Jarvis Newcomer that EtCO2 53-59.  Will cont to monitor

## 2020-04-07 NOTE — H&P (Signed)
Chief Complaint: Thrombosed right leg AV graft  Referring Physician(s): Webb,Martin  Supervising Physician: Arne Cleveland  Patient Status: [name] - Out-pt  History of Present Illness: David [name] is a 79 y.o. male with longstanding history of ESRD on hemodialysis.  He is here today for declot of his right leg AV graft.  He also almost exhausted his access.  He has history of bilateral UE graft/fistulas and a left leg graft. All have thrombosed.  He currently has a right jugular tunneled HD catheter in place.  He states the graft has been thrombosed at least 3 weeks. He has had thrombectomy of this in the past x 2.  Most recent was by Dr. Earleen Newport 01/26/2020.  He is NPO. No nausea/vomiting. No Fever/chills. ROS negative.  Past Medical History:  Diagnosis Date  . 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 Acute on chronic systolic and diastolic heart failure, NYHA class 4 ([place])   . Anemia, iron deficiency 11/15/2011  . Arthritis    "hands, right knee, feet" (02/21/2018)  . Atrial fibrillation ([place])   . Cancer [facility])    hx of prostate; s/p radioactive seed implant 10/2009 Dr Janice Norrie  . Cardiac arrest ([place]) 02/17/2018  . CHF (congestive heart failure) ([place])   . Chronic combined systolic and diastolic heart failure, NYHA class 3 ([place]) 06/2010   felt to be secondary to hypertensive cardiomyopathy  . Chronic lower back pain   . CKD (chronic kidney disease) stage V requiring chronic dialysis ([place])   . ESRD on dialysis [facility])    started 02/2018 [place]  . GERD (gastroesophageal reflux disease)   . Glaucoma    "I had surgery and dont have it any more"  . Gout    daily RX (02/21/2018)  . Heart murmur    "mild" per pt  . Hepatitis    years ago  . HIV (human immunodeficiency virus infection) ([place])   . HIV infection ([place])   . Hyperlipidemia   . Hypertension    followed by [facility] (Dr Dani Gobble Croitoru)  . Hypertension   .  Nonischemic cardiomyopathy ([place])   . Pneumonia 11/2017  . Prostate cancer ([place])   . Sinus bradycardia   . Sleep apnea    does not use a cpap  . Stroke [facility])    "mini stroke" years ago  . Wears glasses     Past Surgical History:  Procedure Laterality Date  . AV FISTULA PLACEMENT Right 10/13/2016   Procedure: ARTERIOVENOUS (AV) FISTULA CREATION;  Surgeon: [name], MD;  Location: [place];  Service: Vascular;  Laterality: Right;  . AV FISTULA PLACEMENT Left 02/25/2018   Procedure: INSERTION OF ARTERIOVENOUS (AV) GORE-TEX GRAFT LEFT UPPER ARM;  Surgeon: Angelia Mould, MD;  Location: [place];  Service: Vascular;  Laterality: Left;  . AV FISTULA PLACEMENT Right 08/07/2018   Procedure: Creation of right arm brachiocephalic Fistula;  Surgeon: Waynetta Sandy, MD;  Location: [place];  Service: Vascular;  Laterality: Right;  . AV FISTULA PLACEMENT Left 11/10/2019   Procedure: INSERTION OF ARTERIOVENOUS (AV) GORE-TEX GRAFT THIGH;  Surgeon: Elam Dutch, MD;  Location: [place];  Service: Vascular;  Laterality: Left;  . AV FISTULA PLACEMENT Right 12/02/2019   Procedure: INSERTION OF ARTERIOVENOUS (AV) GORE-TEX GRAFT RIGHT  THIGH;  Surgeon: Waynetta Sandy, MD;  Location: [place];  Service: Vascular;  Laterality: Right;  . BASCILIC VEIN TRANSPOSITION Left 06/24/2015   Procedure: BASILIC VEIN TRANSPOSITION;  Surgeon: Mal Misty, MD;  Location: [place];  Service: Vascular;  Laterality: Left;  . BIOPSY  03/14/2019   Procedure: BIOPSY;  Surgeon: Irene Shipper, MD;  Location: [place];  Service: Endoscopy;;  . CATARACT EXTRACTION, BILATERAL Bilateral   . COLONOSCOPY WITH PROPOFOL N/A 03/14/2019   Procedure: COLONOSCOPY WITH PROPOFOL;  Surgeon: Irene Shipper, MD;  Location: [facility] END[facility];  Service: Endoscopy;  Laterality: N/A;  . ESOPHAGOGASTRODUODENOSCOPY (EGD) WITH PROPOFOL N/A 05/05/2014   Procedure: ESOPHAGOGASTRODUODENOSCOPY (EGD) WITH PROPOFOL;  Surgeon: Missy Sabins, MD;   Location: [facility];  Service: Endoscopy;  Laterality: N/A;  . EYE SURGERY Bilateral    cataract surgery   . GLAUCOMA SURGERY Bilateral   . INSERTION OF ARTERIOVENOUS (AV) ARTEGRAFT ARM  10/09/2018   Procedure: INSERTION OF ARTERIOVENOUS (AV) 71mm x 41cm ARTEGRAFT LEFT UPPER ARM;  Surgeon: Waynetta Sandy, MD;  Location: [place];  Service: Vascular;;  . INSERTION OF DIALYSIS CATHETER Right 07/14/2019   Procedure: Insertion Of Dialysis Catheter;  Surgeon: Elam Dutch, MD;  Location: [name] OR;  Service: Vascular;  Laterality: Right;  placed right Internal Jugular  . INSERTION PROSTATE RADIATION SEED    . IR FLUORO GUIDE CV LINE RIGHT  02/18/2018  . IR REMOVAL TUN CV CATH W/O FL  12/06/2018  . IR THROMBECTOMY AV FISTULA W/THROMBOLYSIS/PTA INC/SHUNT/IMG RIGHT Right 01/26/2020  . IR THROMBECTOMY AV FISTULA W/THROMBOLYSIS/PTA INC/SHUNT/IMG RIGHT Right 02/24/2020  . IR US GUIDE VASC ACCESS RIGHT  02/18/2018  . IR US GUIDE VASC ACCESS RIGHT  01/26/2020  . LEFT HEART CATH AND CORONARY ANGIOGRAPHY N/A 02/20/2018   Procedure: LEFT HEART CATH AND CORONARY ANGIOGRAPHY;  Surgeon: Jettie Booze, MD;  Location: [place] CV LAB;;; 50% ostial OM2, 25% mLAD.  Marland Kitchen LEFT HEART CATH AND CORONARY ANGIOGRAPHY  2004   mildly depressed LV systolic fx EF 51%,OACZYS coronaries/abdominal aorta/renal arteries.  . LOWER EXTREMITY ANGIOGRAM Left 11/17/2019   Procedure: Lower Extremity Fistulogram;  Surgeon: Marty Heck, MD;  Location: [place];  Service: Vascular;  Laterality: Left;  . NM [place]  02/21/2010   No ischemia or infarction.  EF 27%.  Marland Kitchen RADIOACTIVE SEED IMPLANT  2010   prostate cancer  . REVISION OF ARTERIOVENOUS GORETEX GRAFT Right 07/11/2019   Procedure: REVISION OF ARTERIOVENOUS GORETEX GRAFT RIGHT ARM WITH ARTEGRAFT;  Surgeon: Serafina Mitchell, MD;  Location: [place];  Service: Vascular;  Laterality: Right;  . SHUNTOGRAM Right 07/14/2019   Procedure: Shuntogram;  Surgeon:  Elam Dutch, MD;  Location: [place];  Service: Vascular;  Laterality: Right;  . THROMBECTOMY AND REVISION OF ARTERIOVENTOUS (AV) GORETEX  GRAFT Right 07/14/2019   Procedure: THROMBECTOMY AND REVISION OF ARTERIOVENTOUS (AV) GORETEX  GRAFT;  Surgeon: Elam Dutch, MD;  Location: [place];  Service: Vascular;  Laterality: Right;  . THROMBECTOMY W/ EMBOLECTOMY Left 02/26/2018   Procedure: THROMBECTOMY ARTERIOVENOUS GRAFT;  Surgeon: Angelia Mould, MD;  Location: [place];  Service: Vascular;  Laterality: Left;  . THROMBECTOMY W/ EMBOLECTOMY Left 11/17/2019   Procedure: THROMBECTOMY ARTERIOVENOUS GRAFT LEFT THIGH;  Surgeon: Marty Heck, MD;  Location: [place];  Service: Vascular;  Laterality: Left;  . TRANSTHORACIC ECHOCARDIOGRAM  02/2012   [facility]) EF 45-50% with mild global HK.  Mild LVH,LA mod. dilated,mild-mod. 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 AI.  Marland Kitchen TRANSTHORACIC ECHOCARDIOGRAM  9/'18; 3/'19   a) Severely reduced EF.  25 from 30%.  GR 1 DD with diffuse  ecchymosis. Mild AS & AI.  Mild LA/RA dilation; b) (post PEA arest):   Moderately dilated LV with moderate concentric virtually.  EF 15 to 20%.  GR 1 DD.  Paradoxical septal motion. Mild AI.  Mild LA dilation.  Poorly visualized RV.  Marland Kitchen TRANSTHORACIC ECHOCARDIOGRAM  02/2019   EF 20-25%.  Unable to assess diastolic function (A. Fib).  No LV apical thrombus.  Mild AI.  Mildly reduced RV function, however poorly visualized.  Marland Kitchen ULTRASOUND GUIDANCE FOR VASCULAR ACCESS  02/20/2018   Procedure: Ultrasound Guidance For Vascular Access;  Surgeon: Jettie Booze, MD;  Location: [place] CV LAB;  Service: Cardiovascular;;  . UPPER EXTREMITY VENOGRAPHY N/A 07/08/2018   Procedure: UPPER EXTREMITY VENOGRAPHY;  Surgeon: Waynetta Sandy, MD;  Location: [place] CV LAB;  Service: Cardiovascular;  Laterality: N/A;  Bilateral    Allergies: Dextromethorphan-guaifenesin, Tocotrienols, and Losartan  potassium  Medications: Prior to Admission medications   Medication Sig Start Date End Date Taking? Authorizing Provider  acetaminophen (TYLENOL) 500 MG tablet Take 1,000 mg by mouth every 6 (six) hours as needed for moderate pain.   Yes [provider]  allopurinol (ZYLOPRIM) 100 MG tablet Take 1 tablet (100 mg total) by mouth See admin instructions. Take one tablet (100 mg) by mouth Mondays, Tuesday, Thursday, Saturday after dialysis 03/25/20  Yes Croitoru, Mihai, MD  amiodarone (PACERONE) 200 MG tablet Take 1 tablet (200 mg total) by mouth daily. Patient taking differently: Take 200 mg by mouth daily at 12 noon.  05/12/19  Yes Croitoru, Mihai, MD  bictegravir-emtricitabine-tenofovir AF ([name]) 50-200-25 MG TABS tablet Take 1 tablet by mouth daily. Patient taking differently: Take 1 tablet by mouth daily at 12 noon.  09/29/19  Yes Tommy Medal, Lavell Islam, MD  brimonidine (ALPHAGAN) 0.15 % ophthalmic solution Place 1 drop into the left eye 2 (two) times daily. 11/15/17  Yes [provider]  latanoprost (XALATAN) 0.005 % ophthalmic solution Place 1 drop into the left eye at bedtime.    Yes [provider]  loratadine (CLARITIN) 10 MG tablet Take 10 mg by mouth daily at 12 noon.    Yes [provider]  midodrine (PROAMATINE) 10 MG tablet Take 1 tablet (10 mg total) by mouth as directed. Twice a day on the day of HD Tuesday, Thursday, Saturday (1 in AM and 1 at [place]). Take 1/2 tablet (5mg ) a day on non HD days Patient taking differently: Take 10 mg by mouth See admin instructions. Take 20 mg in the morning of dialysis days and 10 mg half way through treatment on dialysis days (Mon, Tues, [place], and Sat) 04/22/19  Yes Leonie Man, MD  montelukast (SINGULAIR) 10 MG tablet Take 1 tablet (10 mg total) by mouth at bedtime. NEEDS OV/FOLLOW UP 03/23/20  Yes [name], NP  Multiple Vitamin (MULTIVITAMIN WITH MINERALS) TABS tablet Take 1 tablet by mouth every  evening.    Yes [provider]  polyethylene glycol (MIRALAX / GLYCOLAX) 17 g packet Take 17 g by mouth every evening.   Yes [provider]  sucroferric oxyhydroxide (VELPHORO) 500 MG chewable tablet Chew 500 mg by mouth 3 (three) times daily with meals.   Yes [provider]  traMADol (ULTRAM) 50 MG tablet Take 1 tablet (50 mg total) by mouth every 6 (six) hours as needed. 01/27/20  Yes Domenic Polite, MD  warfarin (COUMADIN) 5 MG tablet Take 5mg  daily for next 3days and then check INR 2/25, goal INR 2-3 Patient taking  differently: Take 2.5-5 mg by mouth See admin instructions. Take 2.5 mg in the evening on Mon and Fri. Take 5 mg in the evening on Sun, Tue, Wed, Thur, and Sat 01/27/20  Yes Domenic Polite, MD     Family History  Problem Relation Age of Onset  . Hypertension Mother   . Thyroid disease Mother   . Cholelithiasis Daughter   . Cholelithiasis Son   . Hypertension Maternal Grandmother   . Diabetes Maternal Grandmother   . Heart attack Neg Hx   . Hyperlipidemia Neg Hx     Social History   Socioeconomic History  . Marital status: Married    Spouse name: Not on file  . Number of children: 2  . Years of education: 50  . Highest education level: Not on file  Occupational History  . 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 Smoking status: Former Smoker    Packs/day: 1.00    Years: 30.00    Pack years: 30.00    Types: Cigarettes    Quit date: 2006    Years since quitting: 15.3  . Smokeless tobacco: Never Used  Substance and Sexual Activity  . Alcohol use: Yes    Alcohol/week: 1.0 standard drinks    Types: 1 Shots of liquor per week    Comment: occasional  . Drug use: Never  . Sexual activity: Not Currently    Comment: declined condoms 09/2019  Other Topics Concern  . Not on file  Social History Narrative   ** Merged History Encounter **       Stays active at home Regular exercise: no Drinks 2  cups of coffee a week, 1 mountain dew soda a day.   Social Determinants of Health   Financial Resource Strain:   . Difficulty of Paying Living Expenses:   Food Insecurity: No Food Insecurity  . Worried About Charity fundraiser in the Last Year: Never true  . Ran Out of Food in the Last Year: Never true  Transportation Needs: No Transportation Needs  . Lack of Transportation (Medical): No  . Lack of Transportation (Non-Medical): No  Physical Activity:   . Days of Exercise per Week:   . Minutes of Exercise per Session:   Stress:   . Feeling of Stress :   Social Connections:   . Frequency of Communication with Friends and Family:   . Frequency of Social Gatherings with Friends and Family:   . Attends Religious Services:   . Active Member of Clubs or Organizations:   . Attends Archivist Meetings:   Marland Kitchen Marital Status:      Review of Systems: A 12 point ROS discussed and pertinent positives are indicated in the HPI above.  All other systems are negative.  Review of Systems  Vital Signs: BP 123/72   Pulse 65   Temp 97.7 F (36.5 C) (Oral)   Resp 20   Ht 6\' 2"  (1.88 m)   Wt 81.6 kg   SpO2 99%   BMI 23.11 kg/m   Physical Exam Vitals reviewed.  Constitutional:      Appearance: Normal appearance.  HENT:     Head: Normocephalic and atraumatic.  Eyes:     Extraocular Movements: Extraocular movements intact.  Cardiovascular:     Rate and Rhythm: Normal rate and regular rhythm.  Pulmonary:     Effort: Pulmonary effort is normal. No respiratory distress.     Breath sounds: Normal  breath sounds.  Abdominal:     General: There is no distension.     Palpations: Abdomen is soft.     Tenderness: There is no abdominal tenderness.  Musculoskeletal:        General: Normal range of motion.     Cervical back: Normal range of motion.       Legs:     Comments: Right leg AV graft. No audible bruit.  Skin:    General: Skin is warm and dry.  Neurological:     General:  No focal deficit present.     Mental Status: He is alert and oriented to person, place, and time.  Psychiatric:        Mood and Affect: Mood normal.        Behavior: Behavior normal.        Thought Content: Thought content normal.        Judgment: Judgment normal.     Imaging: No results found.  Labs:  CBC: Recent Labs    01/24/20 0712 01/25/20 0600 01/26/20 0338 01/27/20 0334  WBC 7.5 8.0 7.9 7.1  HGB 9.3* 9.3* 9.1* 9.2*  HCT 30.4* 30.6* 29.8* 30.5*  PLT 293 283 313 239    COAGS: Recent Labs    01/29/20 0000 02/12/20 0000 02/24/20 1123 02/26/20 0000  INR 1.5* 2.8* 2.7* 4.2*    BMP: Recent Labs    01/26/20 0338 01/27/20 0334 02/24/20 1056 04/07/20 1255  NA 138 136 136 141  K 4.1 3.9 >7.5* 4.7  CL 99 97* 94* 94*  CO2 27 25 23  32  GLUCOSE 95 84 90 97  BUN 25* 15 27* 25*  CALCIUM 9.0 8.9 8.3* 9.4  CREATININE 7.38* 5.25* 5.97* 6.92*  GFRNONAA 6* 10* 8* 7*  GFRAA 7* 11* 10* 8*    LIVER FUNCTION TESTS: Recent Labs    12/24/19 0706 12/31/19 0356 01/03/20 1508 01/09/20 0222 01/24/20 0712 01/25/20 0600  BILITOT 0.9  --   --  0.5 0.6 0.5  AST 16  --   --  23 19 16   ALT 22  --   --  39 16 16  ALKPHOS 90  --   --  139* 149* 129*  PROT 5.2*  --   --  5.3* 6.1* 5.8*  ALBUMIN 2.4*   < > 2.2* 2.3* 2.9* 2.7*   < > = values in this interval not displayed.    TUMOR MARKERS: No results for input(s): AFPTM, CEA, CA199, CHROMGRNA in the last 8760 hours.  Assessment and Plan:  ESRD on hemodialysis with thrombosed right leg graft.  Will attempt thrombectomy today with possible angioplasty by Dr. Vernard Gambles.  Risks and benefits discussed with the patient including, but not limited to bleeding, infection, vascular injury, pulmonary embolism, need for tunneled HD catheter placement or even death.  All of the patient's questions were answered, patient is agreeable to proceed. Consent signed and in chart.  Electronically Signed: Murrell Redden, PA-C     04/07/2020, 2:09 PM      I spent a total of    25 Minutes in face to face in clinical consultation, greater than 50% of which was counseling/coordinating care for AV graft declot.

## 2020-04-07 NOTE — Discharge Instructions (Signed)
Per Dr Vernard Gambles, stitches will be removed at next HD appt.   Dialysis Fistulogram, Care After This sheet gives you information about how to care for yourself after your procedure. Your health care provider may also give you more specific instructions. If you have problems or questions, contact your health care provider. What can I expect after the procedure? After the procedure, it is common to have:  A small amount of discomfort in the area where the small, thin tube (catheter) was placed for the procedure.  A small amount of bruising around the fistula.  Sleepiness and tiredness (fatigue). Follow these instructions at home: Activity   Rest at home and do not lift anything that is heavier than 5 lb (2.3 kg) on the day after your procedure.  Return to your normal activities as told by your health care provider. Ask your health care provider what activities are safe for you.  Do not drive or use heavy machinery while taking prescription pain medicine.  Do not drive for 24 hours if you were given a medicine to help you relax (sedative) during your procedure. Medicines   Take over-the-counter and prescription medicines only as told by your health care provider. Puncture site care  Follow instructions from your health care provider about how to take care of the site where catheters were inserted. Make sure you: ? Wash your hands with soap and water before you change your bandage (dressing). If soap and water are not available, use hand sanitizer. ? Remove your dressing as told by your health care provider. At HD ? Leave stitches (sutures), skin glue, or adhesive strips in place. These skin closures may need to stay in place for 2 weeks or longer. If adhesive strip edges start to loosen and curl up, you may trim the loose edges. Do not remove adhesive strips completely unless your health care provider tells you to do that.  Check your puncture area every day for signs of infection. Check  for: ? Redness, swelling, or pain. ? Fluid or blood. ? Warmth. ? Pus or a bad smell. General instructions  Do not take baths, swim, or use a hot tub until your health care provider approves. Ask your health care provider if you may take showers. You may only be allowed to take sponge baths.  Monitor your dialysis fistula closely. Check to make sure that you can feel a vibration or buzz (a thrill) when you put your fingers over the fistula.  Prevent damage to your graft or fistula: ? Do not wear tight-fitting clothing or jewelry on the arm or leg that has your graft or fistula. ? Tell all your health care providers that you have a dialysis fistula or graft. ? Do not allow blood draws, IVs, or blood pressure readings to be done in the arm that has your fistula or graft. ? Do not allow flu shots or vaccinations in the arm with your fistula or graft.  Keep all follow-up visits as told by your health care provider. This is important. Contact a health care provider if:  You have redness, swelling, or pain at the site where the catheter was put in.  You have fluid or blood coming from the catheter site.  The catheter site feels warm to the touch.  You have pus or a bad smell coming from the catheter site.  You have a fever or chills. Get help right away if:  You feel weak.  You have trouble balancing.  You have trouble moving  your arms or legs.  You have problems with your speech or vision.  You can no longer feel a vibration or buzz when you put your fingers over your dialysis fistula.  The limb that was used for the procedure: ? Swells. ? Is painful. ? Is cold. ? Is discolored, such as blue or pale white.  You have chest pain or shortness of breath. Summary  After a dialysis fistulogram, it is common to have a small amount of discomfort or bruising in the area where the small, thin tube (catheter) was placed.  Rest at home on the day after your procedure. Return to your  normal activities as told by your health care provider.  Take over-the-counter and prescription medicines only as told by your health care provider.  Follow instructions from your health care provider about how to take care of the site where the catheter was inserted.  Keep all follow-up visits as told by your health care provider. This information is not intended to replace advice given to you by your health care provider. Make sure you discuss any questions you have with your health care provider. Document Revised: 12/21/2017 Document Reviewed: 12/21/2017 Elsevier Patient Education  2020 [employer].

## 2020-04-08 DIAGNOSIS — N2581 Secondary hyperparathyroidism of renal origin: Secondary | ICD-10-CM | POA: Diagnosis not present

## 2020-04-08 DIAGNOSIS — N186 End stage renal disease: Secondary | ICD-10-CM | POA: Diagnosis not present

## 2020-04-08 DIAGNOSIS — E8770 Fluid overload, unspecified: Secondary | ICD-10-CM | POA: Diagnosis not present

## 2020-04-08 DIAGNOSIS — Z992 Dependence on renal dialysis: Secondary | ICD-10-CM | POA: Diagnosis not present

## 2020-04-09 ENCOUNTER — Ambulatory Visit (HOSPITAL_COMMUNITY): Payer: Medicare HMO

## 2020-04-10 DIAGNOSIS — E8770 Fluid overload, unspecified: Secondary | ICD-10-CM | POA: Diagnosis not present

## 2020-04-10 DIAGNOSIS — Z992 Dependence on renal dialysis: Secondary | ICD-10-CM | POA: Diagnosis not present

## 2020-04-10 DIAGNOSIS — Z4802 Encounter for removal of sutures: Secondary | ICD-10-CM | POA: Insufficient documentation

## 2020-04-10 DIAGNOSIS — N186 End stage renal disease: Secondary | ICD-10-CM | POA: Diagnosis not present

## 2020-04-10 DIAGNOSIS — N2581 Secondary hyperparathyroidism of renal origin: Secondary | ICD-10-CM | POA: Diagnosis not present

## 2020-04-12 ENCOUNTER — Encounter: Payer: Self-pay | Admitting: Internal Medicine

## 2020-04-12 ENCOUNTER — Ambulatory Visit (INDEPENDENT_AMBULATORY_CARE_PROVIDER_SITE_OTHER): Payer: Medicare HMO | Admitting: Internal Medicine

## 2020-04-12 VITALS — BP 110/62 | HR 77 | Temp 97.3°F | Ht 74.0 in | Wt 182.1 lb

## 2020-04-12 DIAGNOSIS — I712 Thoracic aortic aneurysm, without rupture: Secondary | ICD-10-CM | POA: Diagnosis not present

## 2020-04-12 DIAGNOSIS — Q2579 Other congenital malformations of pulmonary artery: Secondary | ICD-10-CM

## 2020-04-12 DIAGNOSIS — E8770 Fluid overload, unspecified: Secondary | ICD-10-CM | POA: Diagnosis not present

## 2020-04-12 DIAGNOSIS — I7121 Aneurysm of the ascending aorta, without rupture: Secondary | ICD-10-CM

## 2020-04-12 DIAGNOSIS — Z992 Dependence on renal dialysis: Secondary | ICD-10-CM | POA: Diagnosis not present

## 2020-04-12 DIAGNOSIS — N2581 Secondary hyperparathyroidism of renal origin: Secondary | ICD-10-CM | POA: Diagnosis not present

## 2020-04-12 DIAGNOSIS — R1310 Dysphagia, unspecified: Secondary | ICD-10-CM

## 2020-04-12 DIAGNOSIS — N186 End stage renal disease: Secondary | ICD-10-CM | POA: Diagnosis not present

## 2020-04-12 DIAGNOSIS — R1319 Other dysphagia: Secondary | ICD-10-CM

## 2020-04-12 DIAGNOSIS — Z7901 Long term (current) use of anticoagulants: Secondary | ICD-10-CM | POA: Diagnosis not present

## 2020-04-12 NOTE — Patient Instructions (Signed)
You have been scheduled for a Barium Esophogram at [facility] on 04/21/2020 at 9:30AM. Please arrive 15 minutes prior to your appointment for registration. Make certain not to have anything to eat or drink 3 hours prior to your test. If you need to reschedule for any reason, please contact radiology at [phone] to do so. __________________________________________________________________ A barium swallow is an examination that concentrates on views of the esophagus. This tends to be a double contrast exam (barium and two liquids which, when combined, create a gas to distend the wall of the oesophagus) or single contrast (non-ionic iodine based). The study is usually tailored to your symptoms so a good history is essential. Attention is paid during the study to the form, structure and configuration of the esophagus, looking for functional disorders (such as aspiration, dysphagia, achalasia, motility and reflux) EXAMINATION You may be asked to change into a gown, depending on the type of swallow being performed. A radiologist and radiographer will perform the procedure. The radiologist will advise you of the type of contrast selected for your procedure and direct you during the exam. You will be asked to stand, sit or lie in several different positions and to hold a small amount of fluid in your mouth before being asked to swallow while the imaging is performed .In some instances you may be asked to swallow barium coated marshmallows to assess the motility of a solid food bolus. The exam can be recorded as a digital or video fluoroscopy procedure. POST PROCEDURE It will take 1-2 days for the barium to pass through your system. To facilitate this, it is important, unless otherwise directed, to increase your fluids for the next 24-48hrs and to resume your normal diet.  This test typically takes about 30 minutes to  perform. __________________________________________________________________________________  I appreciate the opportunity to care for you. Silvano Rusk, MD, [facility]

## 2020-04-12 NOTE — Progress Notes (Signed)
[facility] 79 y.o. September 02, 1941 [id number]  Assessment & Plan:   Encounter Diagnoses  Name Primary?  . Esophageal dysphagia Yes  . Ascending aortic aneurysm ([place])   . Pulmonary artery anomaly-dilated   . Warfarin anticoagulation     These problems are occurring in a man with numerous comorbidities.  I am going to start with a barium swallow.  I think he may have impingement of his esophagus from these dilated thoracic arteries.  If that is unrevealing may consider EGD but would prefer focused therapy if I see a stricture etc.  He would have to have his procedure at the hospital due to his increased comorbidities and higher risk of problems related to sedation and periprocedural issues.  Warfarin with warfarin would have to be held in conjunction with his cardiologist input.   I appreciate the opportunity to care for this patient. 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 The patient has been having a several month history of intermittent solid food dysphagia.  He also has some occasional heartburn and in the mornings and will have production of a bitter saliva.  There is no known history of esophageal stricture or GERD related problems on endoscopy, he had an EGD by Dr. Teena Irani in 2015 that was unremarkable.  Apparently he was having some nausea and vomiting and dysphagia then.  He has been hospitalized several times due to fluid overload and asked to dialyze 4 days a week, Monday Tuesday Thursday and Saturday in order to avoid frequent hospitalizations due to volume overload and dyspnea.  He has problems with  vascular access and takes warfarin because of this as he has frequent clotting of grafts.  He also has class IV [place] [employer] congestive heart failure.  Weight has been relatively stable.  He says he feels full a lot but does not have other eating issues.  Sometimes he has some mild right lower quadrant pain when on dialysis.  Sometime in the past year it was severe and led to a 14-day hospitalization he reports.  Records review shows that over the past week he had to have a thrombectomy of his right femoral synthetic hemodialysis graft and then a temporary subclavian vascular access catheter placed on May 5 May 6 respectively.  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  There are multiple other abnormalities that are not relevant to the problem at hand but reviewed. Allergies  Allergen Reactions  . Dextromethorphan-Guaifenesin Other (See Comments)    Unknown reaction  . Tocotrienols Other (See Comments)    Unknown reaction  . Losartan Potassium Other (See Comments)    Causes constipation   Current Meds  Medication Sig  . acetaminophen (TYLENOL) 500 MG tablet Take 1,000 mg by mouth every 6 (six) hours as needed for moderate pain.  Marland Kitchen allopurinol (ZYLOPRIM) 100 MG tablet Take 1 tablet (100 mg total) by mouth See admin instructions. Take one tablet (100  mg) by mouth Mondays, Tuesday, Thursday, Saturday after dialysis  . amiodarone (PACERONE) 200 MG tablet Take 1 tablet (200 mg total) by mouth daily. (Patient taking differently: Take 200 mg by mouth daily at 12 noon. )  . bictegravir-emtricitabine-tenofovir AF ([name]) 50-200-25 MG TABS tablet Take 1 tablet by mouth daily. (Patient taking differently: Take 1 tablet by mouth daily at 12 noon. )  .  brimonidine (ALPHAGAN) 0.15 % ophthalmic solution Place 1 drop into the left eye 2 (two) times daily.  Marland Kitchen latanoprost (XALATAN) 0.005 % ophthalmic solution Place 1 drop into the left eye at bedtime.   Marland Kitchen loratadine (CLARITIN) 10 MG tablet Take 10 mg by mouth daily at 12 noon.   . midodrine (PROAMATINE) 10 MG tablet Take 1 tablet (10 mg total) by mouth as directed. Twice a day on the day of HD Tuesday, Thursday, Saturday (1 in AM and 1 at [place]). Take 1/2 tablet (5mg ) a day on non HD days (Patient taking differently: Take 10 mg by mouth See admin instructions. Take 20 mg in the morning of dialysis days and 10 mg half way through treatment on dialysis days (Mon, Tues, Thurs, and Sat))  . montelukast (SINGULAIR) 10 MG tablet Take 1 tablet (10 mg total) by mouth at bedtime. NEEDS OV/FOLLOW UP  . Multiple Vitamin (MULTIVITAMIN WITH MINERALS) TABS tablet Take 1 tablet by mouth every evening.   . polyethylene glycol (MIRALAX / GLYCOLAX) 17 g packet Take 17 g by mouth every evening.  . sucroferric oxyhydroxide (VELPHORO) 500 MG chewable tablet Chew 500 mg by mouth 3 (three) times daily with meals.  . traMADol (ULTRAM) 50 MG tablet Take 1 tablet (50 mg total) by mouth every 6 (six) hours as needed.  . warfarin (COUMADIN) 5 MG tablet Take 5mg  daily for next 3days and then check INR 2/25, goal INR 2-3 (Patient taking differently: Take 2.5-5 mg by mouth See admin instructions. Take 2.5 mg in the evening on Mon and Fri. Take 5 mg in the evening on Sun, Tue, Wed, Thur, and Sat)   Past Medical History:  Diagnosis Date  . 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 Acute on chronic systolic and diastolic heart failure, NYHA class 4 ([place])   . Anemia, iron deficiency 11/15/2011  . Arthritis    "hands, right knee, feet" (02/21/2018)  . Atrial fibrillation ([place])   . Cancer [facility])    hx of prostate; s/p radioactive seed implant 10/2009 Dr Janice Norrie  . Cardiac arrest ([place]) 02/17/2018  . CHF (congestive heart failure) ([place])   .  Chronic combined systolic and diastolic heart failure, NYHA class 3 ([place]) 06/2010   felt to be secondary to hypertensive cardiomyopathy  . Chronic lower back pain   . CKD (chronic kidney disease) stage V requiring chronic dialysis ([place])   . ESRD on dialysis [facility])    started 02/2018 [place]  . GERD (gastroesophageal reflux disease)   . Glaucoma    "I had surgery and dont have it any more"  . Gout    daily RX (02/21/2018)  . Heart murmur    "mild" per pt  . Hepatitis    years ago  . HIV (human immunodeficiency virus infection) ([place])   . HIV infection ([place])   . Hyperlipidemia   . Hypertension    followed by [name] (Dr Dani Gobble Croitoru)  . Hypertension   . Nonischemic cardiomyopathy ([place])   . Pneumonia 11/2017  . Prostate cancer ([place])   . Sinus bradycardia   .  Sleep apnea    does not use a cpap  . Stroke [facility])    "mini stroke" years ago  . Wears glasses    Past Surgical History:  Procedure Laterality Date  . AV FISTULA PLACEMENT Right 10/13/2016   Procedure: ARTERIOVENOUS (AV) FISTULA CREATION;  Surgeon: [name], MD;  Location: [place];  Service: Vascular;  Laterality: Right;  . AV FISTULA PLACEMENT Left 02/25/2018   Procedure: INSERTION OF ARTERIOVENOUS (AV) GORE-TEX GRAFT LEFT UPPER ARM;  Surgeon: Angelia Mould, MD;  Location: [place];  Service: Vascular;  Laterality: Left;  . AV FISTULA PLACEMENT Right 08/07/2018   Procedure: Creation of right arm brachiocephalic Fistula;  Surgeon: Waynetta Sandy, MD;  Location: [place];  Service: Vascular;  Laterality: Right;  . AV FISTULA PLACEMENT Left 11/10/2019   Procedure: INSERTION OF ARTERIOVENOUS (AV) GORE-TEX GRAFT THIGH;  Surgeon: Elam Dutch, MD;  Location: [place];  Service: Vascular;  Laterality: Left;  . AV FISTULA PLACEMENT Right 12/02/2019   Procedure: INSERTION OF ARTERIOVENOUS (AV) GORE-TEX GRAFT RIGHT  THIGH;  Surgeon: Waynetta Sandy, MD;   Location: [place];  Service: Vascular;  Laterality: Right;  . BASCILIC VEIN TRANSPOSITION Left 06/24/2015   Procedure: BASILIC VEIN TRANSPOSITION;  Surgeon: Mal Misty, MD;  Location: [place];  Service: Vascular;  Laterality: Left;  . BIOPSY  03/14/2019   Procedure: BIOPSY;  Surgeon: Irene Shipper, MD;  Location: [place];  Service: Endoscopy;;  . CATARACT EXTRACTION, BILATERAL Bilateral   . COLONOSCOPY WITH PROPOFOL N/A 03/14/2019   Procedure: COLONOSCOPY WITH PROPOFOL;  Surgeon: Irene Shipper, MD;  Location: [facility] ENDOSCOPY;  Service: Endoscopy;  Laterality: N/A;  . ESOPHAGOGASTRODUODENOSCOPY (EGD) WITH PROPOFOL N/A 05/05/2014   Procedure: ESOPHAGOGASTRODUODENOSCOPY (EGD) WITH PROPOFOL;  Surgeon: Missy Sabins, MD;  Location: [facility] ENDOSCOPY;  Service: Endoscopy;  Laterality: N/A;  . EYE SURGERY Bilateral    cataract surgery   . GLAUCOMA SURGERY Bilateral   . INSERTION OF ARTERIOVENOUS (AV) ARTEGRAFT ARM  10/09/2018   Procedure: INSERTION OF ARTERIOVENOUS (AV) 61mm x 41cm ARTEGRAFT LEFT UPPER ARM;  Surgeon: Waynetta Sandy, MD;  Location: [place];  Service: Vascular;;  . INSERTION OF DIALYSIS CATHETER Right 07/14/2019   Procedure: Insertion Of Dialysis Catheter;  Surgeon: Elam Dutch, MD;  Location[facility] OR;  Service: Vascular;  Laterality: Right;  placed right Internal Jugular  . INSERTION PROSTATE RADIATION SEED    . IR FLUORO GUIDE CV LINE RIGHT  02/18/2018  . IR REMOVAL TUN CV CATH W/O FL  12/06/2018  . IR THROMBECTOMY AV FISTULA W/THROMBOLYSIS/PTA INC/SHUNT/IMG RIGHT Right 01/26/2020  . IR THROMBECTOMY AV FISTULA W/THROMBOLYSIS/PTA INC/SHUNT/IMG RIGHT Right 02/24/2020  . IR THROMBECTOMY AV FISTULA W/THROMBOLYSIS/PTA INC/SHUNT/IMG RIGHT Right 04/07/2020  . IR US GUIDE VASC ACCESS RIGHT  02/18/2018  . IR US GUIDE VASC ACCESS RIGHT  01/26/2020  . IR US GUIDE VASC ACCESS RIGHT  04/07/2020  . LEFT HEART CATH AND CORONARY ANGIOGRAPHY N/A 02/20/2018   Procedure: LEFT HEART CATH AND CORONARY  ANGIOGRAPHY;  Surgeon: Jettie Booze, MD;  Location: [place] CV LAB;;; 50% ostial OM2, 25% mLAD.  Marland Kitchen LEFT HEART CATH AND CORONARY ANGIOGRAPHY  2004   mildly depressed LV systolic fx EF 10%,CHENID coronaries/abdominal aorta/renal arteries.  . LOWER EXTREMITY ANGIOGRAM Left 11/17/2019   Procedure: Lower Extremity Fistulogram;  Surgeon: Marty Heck, MD;  Location: [place];  Service: Vascular;  Laterality: Left;  . NM [place]  02/21/2010  No ischemia or infarction.  EF 27%.  Marland Kitchen RADIOACTIVE SEED IMPLANT  2010   prostate cancer  . REVISION OF ARTERIOVENOUS GORETEX GRAFT Right 07/11/2019   Procedure: REVISION OF ARTERIOVENOUS GORETEX GRAFT RIGHT ARM WITH ARTEGRAFT;  Surgeon: Serafina Mitchell, MD;  Location: [place];  Service: Vascular;  Laterality: Right;  . SHUNTOGRAM Right 07/14/2019   Procedure: Shuntogram;  Surgeon: Elam Dutch, MD;  Location: [place];  Service: Vascular;  Laterality: Right;  . THROMBECTOMY AND REVISION OF ARTERIOVENTOUS (AV) GORETEX  GRAFT Right 07/14/2019   Procedure: THROMBECTOMY AND REVISION OF ARTERIOVENTOUS (AV) GORETEX  GRAFT;  Surgeon: Elam Dutch, MD;  Location: [place];  Service: Vascular;  Laterality: Right;  . THROMBECTOMY W/ EMBOLECTOMY Left 02/26/2018   Procedure: THROMBECTOMY ARTERIOVENOUS GRAFT;  Surgeon: Angelia Mould, MD;  Location: [place];  Service: Vascular;  Laterality: Left;  . THROMBECTOMY W/ EMBOLECTOMY Left 11/17/2019   Procedure: THROMBECTOMY ARTERIOVENOUS GRAFT LEFT THIGH;  Surgeon: Marty Heck, MD;  Location: [place];  Service: Vascular;  Laterality: Left;  . TRANSTHORACIC ECHOCARDIOGRAM  02/2012   [facility]) EF 45-50% with mild global HK.  Mild LVH,LA mod. dilated,mild-mod. 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 AI.  Marland Kitchen TRANSTHORACIC ECHOCARDIOGRAM  9/'18; 3/'19   a) Severely reduced EF.  25 from 30%.  GR 1 DD with diffuse ecchymosis. Mild AS & AI.  Mild LA/RA dilation; b) (post  PEA arest):   Moderately dilated LV with moderate concentric virtually.  EF 15 to 20%.  GR 1 DD.  Paradoxical septal motion. Mild AI.  Mild LA dilation.  Poorly visualized RV.  Marland Kitchen TRANSTHORACIC ECHOCARDIOGRAM  02/2019   EF 20-25%.  Unable to assess diastolic function (A. Fib).  No LV apical thrombus.  Mild AI.  Mildly reduced RV function, however poorly visualized.  Marland Kitchen ULTRASOUND GUIDANCE FOR VASCULAR ACCESS  02/20/2018   Procedure: Ultrasound Guidance For Vascular Access;  Surgeon: Jettie Booze, MD;  Location: [place] CV LAB;  Service: Cardiovascular;;  . UPPER EXTREMITY VENOGRAPHY N/A 07/08/2018   Procedure: UPPER EXTREMITY VENOGRAPHY;  Surgeon: Waynetta Sandy, MD;  Location: [place] CV LAB;  Service: Cardiovascular;  Laterality: N/A;  Bilateral   Social History   Social History Narrative   Retired, 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   family history includes Cholelithiasis in his daughter and son; Diabetes in his maternal grandmother; Hypertension in his maternal grandmother and mother; Thyroid disease in his mother.   Review of Systems As per HPI some allergy problems lots of muscle cramps and insomnia all other review of systems negative or as per HPI  Objective:   Physical Exam BP 110/62 (BP Location: Left Arm, Patient Position: Sitting, Cuff Size: Normal)   Pulse 77   Temp (!) 97.3 F (36.3 C) (Other (Comment))   Ht 6\' 2"  (1.88 m) Comment: per patient  Wt 182 lb 2 oz (82.6 kg)   BMI 23.38 kg/m  Chronically ill black man in wheelchair no acute distress Eyes are anicteric The neck is supple without mass or lymphadenopathy or thyromegaly The mouth shows some remaining teeth in poor repair particularly molars absent The lungs show diffusely decreased but clear breath sounds Heart sounds are  distant In the chest wall there is a vascular access catheter in the right subclavian area The abdomen is soft and nontender without mass Extremities are free of edema He has an appropriate mood and affect Is alert and oriented x3   Data reviewed includes those things mentioned in the HPI.

## 2020-04-13 DIAGNOSIS — E8770 Fluid overload, unspecified: Secondary | ICD-10-CM | POA: Diagnosis not present

## 2020-04-13 DIAGNOSIS — N2581 Secondary hyperparathyroidism of renal origin: Secondary | ICD-10-CM | POA: Diagnosis not present

## 2020-04-13 DIAGNOSIS — Z992 Dependence on renal dialysis: Secondary | ICD-10-CM | POA: Diagnosis not present

## 2020-04-13 DIAGNOSIS — N186 End stage renal disease: Secondary | ICD-10-CM | POA: Diagnosis not present

## 2020-04-15 DIAGNOSIS — E8770 Fluid overload, unspecified: Secondary | ICD-10-CM | POA: Diagnosis not present

## 2020-04-15 DIAGNOSIS — Z992 Dependence on renal dialysis: Secondary | ICD-10-CM | POA: Diagnosis not present

## 2020-04-15 DIAGNOSIS — N186 End stage renal disease: Secondary | ICD-10-CM | POA: Diagnosis not present

## 2020-04-15 DIAGNOSIS — N2581 Secondary hyperparathyroidism of renal origin: Secondary | ICD-10-CM | POA: Diagnosis not present

## 2020-04-17 DIAGNOSIS — N186 End stage renal disease: Secondary | ICD-10-CM | POA: Diagnosis not present

## 2020-04-17 DIAGNOSIS — Z992 Dependence on renal dialysis: Secondary | ICD-10-CM | POA: Diagnosis not present

## 2020-04-17 DIAGNOSIS — N2581 Secondary hyperparathyroidism of renal origin: Secondary | ICD-10-CM | POA: Diagnosis not present

## 2020-04-17 DIAGNOSIS — E8770 Fluid overload, unspecified: Secondary | ICD-10-CM | POA: Diagnosis not present

## 2020-04-19 DIAGNOSIS — E8770 Fluid overload, unspecified: Secondary | ICD-10-CM | POA: Diagnosis not present

## 2020-04-19 DIAGNOSIS — N2581 Secondary hyperparathyroidism of renal origin: Secondary | ICD-10-CM | POA: Diagnosis not present

## 2020-04-19 DIAGNOSIS — N186 End stage renal disease: Secondary | ICD-10-CM | POA: Diagnosis not present

## 2020-04-19 DIAGNOSIS — Z992 Dependence on renal dialysis: Secondary | ICD-10-CM | POA: Diagnosis not present

## 2020-04-20 DIAGNOSIS — Z992 Dependence on renal dialysis: Secondary | ICD-10-CM | POA: Diagnosis not present

## 2020-04-20 DIAGNOSIS — E8770 Fluid overload, unspecified: Secondary | ICD-10-CM | POA: Diagnosis not present

## 2020-04-20 DIAGNOSIS — N2581 Secondary hyperparathyroidism of renal origin: Secondary | ICD-10-CM | POA: Diagnosis not present

## 2020-04-20 DIAGNOSIS — N186 End stage renal disease: Secondary | ICD-10-CM | POA: Diagnosis not present

## 2020-04-21 ENCOUNTER — Ambulatory Visit (HOSPITAL_COMMUNITY)
Admission: RE | Admit: 2020-04-21 | Discharge: 2020-04-21 | Disposition: A | Discharge status: Home / Self Care | Payer: Medicare HMO | Source: Ambulatory Visit | Attending: Internal Medicine | Admitting: Internal Medicine

## 2020-04-21 ENCOUNTER — Other Ambulatory Visit: Payer: Self-pay | Admitting: Internal Medicine

## 2020-04-21 ENCOUNTER — Other Ambulatory Visit: Payer: Self-pay

## 2020-04-21 DIAGNOSIS — R1319 Other dysphagia: Secondary | ICD-10-CM

## 2020-04-21 DIAGNOSIS — K224 Dyskinesia of esophagus: Secondary | ICD-10-CM | POA: Diagnosis not present

## 2020-04-21 DIAGNOSIS — R1310 Dysphagia, unspecified: Secondary | ICD-10-CM | POA: Diagnosis not present

## 2020-04-22 DIAGNOSIS — N186 End stage renal disease: Secondary | ICD-10-CM | POA: Diagnosis not present

## 2020-04-22 DIAGNOSIS — Z992 Dependence on renal dialysis: Secondary | ICD-10-CM | POA: Diagnosis not present

## 2020-04-22 DIAGNOSIS — N2581 Secondary hyperparathyroidism of renal origin: Secondary | ICD-10-CM | POA: Diagnosis not present

## 2020-04-22 DIAGNOSIS — E8770 Fluid overload, unspecified: Secondary | ICD-10-CM | POA: Diagnosis not present

## 2020-04-22 LAB — PROTIME-INR: INR: 1.9 — AB (ref <=1.1)

## 2020-04-23 LAB — PROTIME-INR: INR: 1.9 — AB (ref 0.9–1.1)

## 2020-04-24 DIAGNOSIS — N186 End stage renal disease: Secondary | ICD-10-CM | POA: Diagnosis not present

## 2020-04-24 DIAGNOSIS — N2581 Secondary hyperparathyroidism of renal origin: Secondary | ICD-10-CM | POA: Diagnosis not present

## 2020-04-24 DIAGNOSIS — Z992 Dependence on renal dialysis: Secondary | ICD-10-CM | POA: Diagnosis not present

## 2020-04-24 DIAGNOSIS — E8770 Fluid overload, unspecified: Secondary | ICD-10-CM | POA: Diagnosis not present

## 2020-04-26 ENCOUNTER — Ambulatory Visit (INDEPENDENT_AMBULATORY_CARE_PROVIDER_SITE_OTHER): Payer: Medicare HMO | Admitting: Pharmacist

## 2020-04-26 DIAGNOSIS — N2581 Secondary hyperparathyroidism of renal origin: Secondary | ICD-10-CM | POA: Diagnosis not present

## 2020-04-26 DIAGNOSIS — Z992 Dependence on renal dialysis: Secondary | ICD-10-CM | POA: Diagnosis not present

## 2020-04-26 DIAGNOSIS — N186 End stage renal disease: Secondary | ICD-10-CM | POA: Diagnosis not present

## 2020-04-26 DIAGNOSIS — Z7901 Long term (current) use of anticoagulants: Secondary | ICD-10-CM

## 2020-04-26 DIAGNOSIS — E8770 Fluid overload, unspecified: Secondary | ICD-10-CM | POA: Diagnosis not present

## 2020-04-26 DIAGNOSIS — I480 Paroxysmal atrial fibrillation: Secondary | ICD-10-CM | POA: Diagnosis not present

## 2020-04-27 DIAGNOSIS — N2581 Secondary hyperparathyroidism of renal origin: Secondary | ICD-10-CM | POA: Diagnosis not present

## 2020-04-27 DIAGNOSIS — Z992 Dependence on renal dialysis: Secondary | ICD-10-CM | POA: Diagnosis not present

## 2020-04-27 DIAGNOSIS — N186 End stage renal disease: Secondary | ICD-10-CM | POA: Diagnosis not present

## 2020-04-27 DIAGNOSIS — E8770 Fluid overload, unspecified: Secondary | ICD-10-CM | POA: Diagnosis not present

## 2020-04-29 DIAGNOSIS — N2581 Secondary hyperparathyroidism of renal origin: Secondary | ICD-10-CM | POA: Diagnosis not present

## 2020-04-29 DIAGNOSIS — E8770 Fluid overload, unspecified: Secondary | ICD-10-CM | POA: Diagnosis not present

## 2020-04-29 DIAGNOSIS — Z992 Dependence on renal dialysis: Secondary | ICD-10-CM | POA: Diagnosis not present

## 2020-04-29 DIAGNOSIS — N186 End stage renal disease: Secondary | ICD-10-CM | POA: Diagnosis not present

## 2020-05-01 DIAGNOSIS — N2581 Secondary hyperparathyroidism of renal origin: Secondary | ICD-10-CM | POA: Diagnosis not present

## 2020-05-01 DIAGNOSIS — N186 End stage renal disease: Secondary | ICD-10-CM | POA: Diagnosis not present

## 2020-05-01 DIAGNOSIS — Z992 Dependence on renal dialysis: Secondary | ICD-10-CM | POA: Diagnosis not present

## 2020-05-01 DIAGNOSIS — E8770 Fluid overload, unspecified: Secondary | ICD-10-CM | POA: Diagnosis not present

## 2020-05-03 DIAGNOSIS — N2581 Secondary hyperparathyroidism of renal origin: Secondary | ICD-10-CM | POA: Diagnosis not present

## 2020-05-03 DIAGNOSIS — N186 End stage renal disease: Secondary | ICD-10-CM | POA: Diagnosis not present

## 2020-05-03 DIAGNOSIS — Z992 Dependence on renal dialysis: Secondary | ICD-10-CM | POA: Diagnosis not present

## 2020-05-03 DIAGNOSIS — E8770 Fluid overload, unspecified: Secondary | ICD-10-CM | POA: Diagnosis not present

## 2020-05-03 DIAGNOSIS — B20 Human immunodeficiency virus [HIV] disease: Secondary | ICD-10-CM | POA: Diagnosis not present

## 2020-05-04 DIAGNOSIS — N2581 Secondary hyperparathyroidism of renal origin: Secondary | ICD-10-CM | POA: Diagnosis not present

## 2020-05-04 DIAGNOSIS — E8770 Fluid overload, unspecified: Secondary | ICD-10-CM | POA: Diagnosis not present

## 2020-05-04 DIAGNOSIS — N186 End stage renal disease: Secondary | ICD-10-CM | POA: Diagnosis not present

## 2020-05-04 DIAGNOSIS — Z992 Dependence on renal dialysis: Secondary | ICD-10-CM | POA: Diagnosis not present

## 2020-05-05 ENCOUNTER — Other Ambulatory Visit: Payer: Self-pay

## 2020-05-05 ENCOUNTER — Other Ambulatory Visit: Payer: Self-pay | Admitting: Cardiology

## 2020-05-05 MED ORDER — WARFARIN SODIUM 5 MG PO TABS: ORAL_TABLET | ORAL | 0 refills | Status: DC

## 2020-05-06 DIAGNOSIS — N186 End stage renal disease: Secondary | ICD-10-CM | POA: Diagnosis not present

## 2020-05-06 DIAGNOSIS — N2581 Secondary hyperparathyroidism of renal origin: Secondary | ICD-10-CM | POA: Diagnosis not present

## 2020-05-06 DIAGNOSIS — E8770 Fluid overload, unspecified: Secondary | ICD-10-CM | POA: Diagnosis not present

## 2020-05-06 DIAGNOSIS — Z992 Dependence on renal dialysis: Secondary | ICD-10-CM | POA: Diagnosis not present

## 2020-05-08 DIAGNOSIS — N186 End stage renal disease: Secondary | ICD-10-CM | POA: Diagnosis not present

## 2020-05-08 DIAGNOSIS — N2581 Secondary hyperparathyroidism of renal origin: Secondary | ICD-10-CM | POA: Diagnosis not present

## 2020-05-08 DIAGNOSIS — Z992 Dependence on renal dialysis: Secondary | ICD-10-CM | POA: Diagnosis not present

## 2020-05-08 DIAGNOSIS — E8770 Fluid overload, unspecified: Secondary | ICD-10-CM | POA: Diagnosis not present

## 2020-05-10 DIAGNOSIS — Z992 Dependence on renal dialysis: Secondary | ICD-10-CM | POA: Diagnosis not present

## 2020-05-10 DIAGNOSIS — N186 End stage renal disease: Secondary | ICD-10-CM | POA: Diagnosis not present

## 2020-05-10 DIAGNOSIS — E8770 Fluid overload, unspecified: Secondary | ICD-10-CM | POA: Diagnosis not present

## 2020-05-10 DIAGNOSIS — N2581 Secondary hyperparathyroidism of renal origin: Secondary | ICD-10-CM | POA: Diagnosis not present

## 2020-05-11 DIAGNOSIS — N186 End stage renal disease: Secondary | ICD-10-CM | POA: Diagnosis not present

## 2020-05-11 DIAGNOSIS — N2581 Secondary hyperparathyroidism of renal origin: Secondary | ICD-10-CM | POA: Diagnosis not present

## 2020-05-11 DIAGNOSIS — Z992 Dependence on renal dialysis: Secondary | ICD-10-CM | POA: Diagnosis not present

## 2020-05-11 DIAGNOSIS — E8770 Fluid overload, unspecified: Secondary | ICD-10-CM | POA: Diagnosis not present

## 2020-05-13 DIAGNOSIS — E8770 Fluid overload, unspecified: Secondary | ICD-10-CM | POA: Diagnosis not present

## 2020-05-13 DIAGNOSIS — N186 End stage renal disease: Secondary | ICD-10-CM | POA: Diagnosis not present

## 2020-05-13 DIAGNOSIS — N2581 Secondary hyperparathyroidism of renal origin: Secondary | ICD-10-CM | POA: Diagnosis not present

## 2020-05-13 DIAGNOSIS — Z992 Dependence on renal dialysis: Secondary | ICD-10-CM | POA: Diagnosis not present

## 2020-05-14 ENCOUNTER — Inpatient Hospital Stay (HOSPITAL_COMMUNITY)
Admit: 2020-05-14 | Discharge: 2020-05-18 | DRG: 444 | Disposition: A | Discharge status: Home / Self Care | Payer: Medicare HMO | Attending: Internal Medicine | Admitting: Internal Medicine

## 2020-05-14 ENCOUNTER — Telehealth (INDEPENDENT_AMBULATORY_CARE_PROVIDER_SITE_OTHER): Payer: Medicare HMO | Admitting: Family

## 2020-05-14 ENCOUNTER — Encounter: Payer: Self-pay | Admitting: Family

## 2020-05-14 ENCOUNTER — Other Ambulatory Visit: Payer: Self-pay

## 2020-05-14 DIAGNOSIS — K828 Other specified diseases of gallbladder: Secondary | ICD-10-CM | POA: Diagnosis not present

## 2020-05-14 DIAGNOSIS — Z992 Dependence on renal dialysis: Secondary | ICD-10-CM

## 2020-05-14 DIAGNOSIS — Z7901 Long term (current) use of anticoagulants: Secondary | ICD-10-CM

## 2020-05-14 DIAGNOSIS — R7401 Elevation of levels of liver transaminase levels: Secondary | ICD-10-CM | POA: Diagnosis present

## 2020-05-14 DIAGNOSIS — Z8616 Personal history of COVID-19: Secondary | ICD-10-CM

## 2020-05-14 DIAGNOSIS — D539 Nutritional anemia, unspecified: Secondary | ICD-10-CM | POA: Diagnosis present

## 2020-05-14 DIAGNOSIS — N2581 Secondary hyperparathyroidism of renal origin: Secondary | ICD-10-CM | POA: Diagnosis not present

## 2020-05-14 DIAGNOSIS — D7589 Other specified diseases of blood and blood-forming organs: Secondary | ICD-10-CM | POA: Diagnosis present

## 2020-05-14 DIAGNOSIS — N3289 Other specified disorders of bladder: Secondary | ICD-10-CM | POA: Diagnosis not present

## 2020-05-14 DIAGNOSIS — I953 Hypotension of hemodialysis: Secondary | ICD-10-CM | POA: Diagnosis present

## 2020-05-14 DIAGNOSIS — I43 Cardiomyopathy in diseases classified elsewhere: Secondary | ICD-10-CM | POA: Diagnosis present

## 2020-05-14 DIAGNOSIS — K8050 Calculus of bile duct without cholangitis or cholecystitis without obstruction: Secondary | ICD-10-CM | POA: Diagnosis not present

## 2020-05-14 DIAGNOSIS — B20 Human immunodeficiency virus [HIV] disease: Secondary | ICD-10-CM | POA: Diagnosis present

## 2020-05-14 DIAGNOSIS — I2510 Atherosclerotic heart disease of native coronary artery without angina pectoris: Secondary | ICD-10-CM | POA: Diagnosis present

## 2020-05-14 DIAGNOSIS — I480 Paroxysmal atrial fibrillation: Secondary | ICD-10-CM | POA: Diagnosis present

## 2020-05-14 DIAGNOSIS — N186 End stage renal disease: Secondary | ICD-10-CM

## 2020-05-14 DIAGNOSIS — I482 Chronic atrial fibrillation, unspecified: Secondary | ICD-10-CM | POA: Diagnosis not present

## 2020-05-14 DIAGNOSIS — I509 Heart failure, unspecified: Secondary | ICD-10-CM | POA: Diagnosis not present

## 2020-05-14 DIAGNOSIS — N134 Hydroureter: Secondary | ICD-10-CM | POA: Diagnosis not present

## 2020-05-14 DIAGNOSIS — E875 Hyperkalemia: Secondary | ICD-10-CM | POA: Diagnosis present

## 2020-05-14 DIAGNOSIS — R1111 Vomiting without nausea: Secondary | ICD-10-CM | POA: Diagnosis not present

## 2020-05-14 DIAGNOSIS — Z8673 Personal history of transient ischemic attack (TIA), and cerebral infarction without residual deficits: Secondary | ICD-10-CM

## 2020-05-14 DIAGNOSIS — E785 Hyperlipidemia, unspecified: Secondary | ICD-10-CM | POA: Diagnosis present

## 2020-05-14 DIAGNOSIS — I519 Heart disease, unspecified: Secondary | ICD-10-CM | POA: Diagnosis not present

## 2020-05-14 DIAGNOSIS — R7989 Other specified abnormal findings of blood chemistry: Secondary | ICD-10-CM

## 2020-05-14 DIAGNOSIS — Z01810 Encounter for preprocedural cardiovascular examination: Secondary | ICD-10-CM | POA: Diagnosis not present

## 2020-05-14 DIAGNOSIS — I132 Hypertensive heart and chronic kidney disease with heart failure and with stage 5 chronic kidney disease, or end stage renal disease: Secondary | ICD-10-CM | POA: Diagnosis present

## 2020-05-14 DIAGNOSIS — D638 Anemia in other chronic diseases classified elsewhere: Secondary | ICD-10-CM | POA: Diagnosis present

## 2020-05-14 DIAGNOSIS — N250 Renal osteodystrophy: Secondary | ICD-10-CM | POA: Diagnosis not present

## 2020-05-14 DIAGNOSIS — Z8546 Personal history of malignant neoplasm of prostate: Secondary | ICD-10-CM | POA: Diagnosis not present

## 2020-05-14 DIAGNOSIS — Z03818 Encounter for observation for suspected exposure to other biological agents ruled out: Secondary | ICD-10-CM | POA: Diagnosis not present

## 2020-05-14 DIAGNOSIS — K8062 Calculus of gallbladder and bile duct with acute cholecystitis without obstruction: Secondary | ICD-10-CM | POA: Diagnosis present

## 2020-05-14 DIAGNOSIS — I4891 Unspecified atrial fibrillation: Secondary | ICD-10-CM | POA: Diagnosis not present

## 2020-05-14 DIAGNOSIS — E8889 Other specified metabolic disorders: Secondary | ICD-10-CM | POA: Diagnosis present

## 2020-05-14 DIAGNOSIS — K219 Gastro-esophageal reflux disease without esophagitis: Secondary | ICD-10-CM | POA: Diagnosis present

## 2020-05-14 DIAGNOSIS — Z21 Asymptomatic human immunodeficiency virus [HIV] infection status: Secondary | ICD-10-CM | POA: Diagnosis not present

## 2020-05-14 DIAGNOSIS — Z87891 Personal history of nicotine dependence: Secondary | ICD-10-CM

## 2020-05-14 DIAGNOSIS — I9589 Other hypotension: Secondary | ICD-10-CM | POA: Diagnosis present

## 2020-05-14 DIAGNOSIS — I5042 Chronic combined systolic (congestive) and diastolic (congestive) heart failure: Secondary | ICD-10-CM | POA: Diagnosis present

## 2020-05-14 DIAGNOSIS — R1013 Epigastric pain: Secondary | ICD-10-CM | POA: Diagnosis not present

## 2020-05-14 DIAGNOSIS — D631 Anemia in chronic kidney disease: Secondary | ICD-10-CM | POA: Diagnosis not present

## 2020-05-14 DIAGNOSIS — R109 Unspecified abdominal pain: Secondary | ICD-10-CM | POA: Diagnosis not present

## 2020-05-14 DIAGNOSIS — R945 Abnormal results of liver function studies: Secondary | ICD-10-CM | POA: Diagnosis not present

## 2020-05-14 DIAGNOSIS — K8020 Calculus of gallbladder without cholecystitis without obstruction: Secondary | ICD-10-CM | POA: Diagnosis not present

## 2020-05-14 LAB — COMPREHENSIVE METABOLIC PANEL
ALT: 489 U/L — ABNORMAL HIGH (ref 0–44)
AST: 501 U/L — ABNORMAL HIGH (ref 15–41)
Albumin: 3.9 g/dL (ref 3.5–5.0)
Alkaline Phosphatase: 311 U/L — ABNORMAL HIGH (ref 38–126)
Anion gap: 14 (ref 5–15)
BUN: 31 mg/dL — ABNORMAL HIGH (ref 8–23)
CO2: 26 mmol/L (ref 22–32)
Calcium: 9.8 mg/dL (ref 8.9–10.3)
Chloride: 98 mmol/L (ref 98–111)
Creatinine, Ser: 8.38 mg/dL — ABNORMAL HIGH (ref 0.61–1.24)
GFR calc Af Amer: 6 mL/min — ABNORMAL LOW (ref >60)
GFR calc non Af Amer: 6 mL/min — ABNORMAL LOW (ref >60)
Glucose, Bld: 119 mg/dL — ABNORMAL HIGH (ref 70–99)
Potassium: 5.2 mmol/L — ABNORMAL HIGH (ref 3.5–5.1)
Sodium: 138 mmol/L (ref 135–145)
Total Bilirubin: 3.1 mg/dL — ABNORMAL HIGH (ref 0.3–1.2)
Total Protein: 7.4 g/dL (ref 6.5–8.1)

## 2020-05-14 LAB — CBC
HCT: 32.9 % — ABNORMAL LOW (ref 39.0–52.0)
Hemoglobin: 10.5 g/dL — ABNORMAL LOW (ref 13.0–17.0)
MCH: 35.2 pg — ABNORMAL HIGH (ref 26.0–34.0)
MCHC: 31.9 g/dL (ref 30.0–36.0)
MCV: 110.4 fL — ABNORMAL HIGH (ref 80.0–100.0)
Platelets: 209 10*3/uL (ref 150–400)
RBC: 2.98 MIL/uL — ABNORMAL LOW (ref 4.22–5.81)
RDW: 16.8 % — ABNORMAL HIGH (ref 11.5–15.5)
WBC: 7.2 10*3/uL (ref 4.0–10.5)
nRBC: 0 % (ref 0.0–0.2)

## 2020-05-14 LAB — LIPASE, BLOOD: Lipase: 37 U/L (ref 11–51)

## 2020-05-14 NOTE — ED Provider Notes (Signed)
[facility] EMERGENCY DEPARTMENT Provider Note   CSN: [id number] Arrival date & time: 05/14/20  1643     History Chief Complaint  Patient presents with  . Abdominal Pain    [facility] is a 79 y.o. male with a history of AIDS, CHF, A. 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 Patient states that yesterday after eating he developed some abdominal discomfort in the periumbilical and epigastric region described as burning/acid type discomfort. He states pain was constant associated with nausea with episodes of emesis each time he tried to eat or drink anything. He states this morning that he tried taking Pepto-Bismol as this felt like his prior acid reflux problems, the seem to help. His pain is currently resolved and he is not feeling nauseated. No other alleviating or aggravating factors. He denies fever, chills, hematemesis, melena, hematochezia, diarrhea, constipation, chest pain, dyspnea, recent travel, or recent antibiotics. He no longer makes urine. He was last dialyzed yesterday. He denies prior abdominal surgeries. Last BM was this morning and was normal.  HPI     Past Medical History:  Diagnosis Date  . 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 Acute on chronic systolic and diastolic heart failure, NYHA class 4 ([place])   . Anemia, iron deficiency 11/15/2011  . Arthritis    "hands, right knee, feet" (02/21/2018)  . Atrial fibrillation ([place])   . Cardiac arrest ([place]) 02/17/2018  . CHF (congestive heart failure) ([place])   . Chronic combined systolic and diastolic heart failure, NYHA class 3 ([place]) 06/2010   felt to be secondary to hypertensive cardiomyopathy  . Chronic lower back pain   . CKD (chronic kidney disease) stage V requiring chronic dialysis ([place])   . COVID-19 12/2019  . ESRD on dialysis [facility])    started  02/2018 [place]  . GERD (gastroesophageal reflux disease)   . Glaucoma    "I had surgery and dont have it any more"  . Gout    daily RX (02/21/2018)  . Heart murmur    "mild" per pt  . Hepatitis    years ago  . HIV (human immunodeficiency virus infection) ([place])   . HIV infection ([place])   . Hyperlipidemia   . Hypertension   . Nonischemic cardiomyopathy ([place])   . Pneumonia 11/2017  . Prostate cancer ([place])    hx of prostate; s/p radioactive seed implant 10/2009 Dr Janice Norrie  . Sinus bradycardia   . Sleep apnea    does not use a cpap  . Stroke [facility])    "mini stroke" years ago  . Wears glasses     Patient Active Problem List   Diagnosis Date Noted  . Full code status 02/03/2020  . Chest pain 01/24/2020  . Subtherapeutic international normalized ratio (INR) 01/24/2020  . Elevated troponin 01/24/2020  . Pressure injury of skin 01/07/2020  . Combined systolic and diastolic heart failure ([place])   . ESRD on dialysis ([place])   . COVID-19 12/11/2019  . Acute respiratory failure with hypoxemia ([place]) 11/01/2019  . Hyperkalemia 09/02/2019  . Unspecified open wound of right upper arm, initial encounter 06/21/2019  . Shortness of breath 04/12/2019  . 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 Dependence on renal dialysis ([place]) 03/27/2019  . Hypertensive heart and chronic kidney disease with heart failure and with stage 5 chronic kidney disease, or end stage renal disease ([place]) 03/27/2019  .  Mesenteric ischemia ([place])   . Colonic ulcer   . Right lower quadrant pain   . Abnormal CT of the abdomen   . Ischemic colitis ([place])   . Colitis presumed infectious 03/09/2019  . Acute respiratory failure with hypoxia and hypercapnia ([place]) 03/04/2019  . Pruritus, unspecified 02/21/2019  . Acute pulmonary edema ([place]) 02/11/2019  . Pain, unspecified 01/21/2019  . Atrial fibrillation with RVR ([name]) 12/05/2018  . Paroxysmal atrial fibrillation ([place]) 11/16/2018  . LBBB (left bundle  branch block) 11/16/2018  . Encounter for monitoring amiodarone therapy 11/16/2018  . Volume overload 11/01/2018  . Pulmonary edema 10/21/2018  . Gout 10/21/2018  . Hypertensive emergency 10/21/2018  . Chronic combined systolic and diastolic heart failure, NYHA class 3 ([place]) 10/21/2018  . Leukocytosis 10/21/2018  . Chills (without fever) 09/24/2018  . Gastroesophageal reflux disease 08/28/2018  . Chronic anticoagulation 04/03/2018  . CAD (coronary artery disease) 04/03/2018  . Palliative care encounter 03/27/2018  . Atrial fibrillation, chronic ([place]) 03/25/2018  . Anemia in chronic kidney disease 03/12/2018  . Coagulation defect, unspecified ([place]) 03/12/2018  . Secondary hyperparathyroidism of renal origin ([place]) 03/12/2018  . Palliative care by specialist   . Advance care planning   . Goals of care, counseling/discussion   . Respiratory arrest ([place])   . Acute on chronic systolic heart failure ([place])   . Chronic obstructive pulmonary disease ([place])   . HIV (human immunodeficiency virus infection) ([place])   . ESRD (end stage renal disease) ([place])   . Acute respiratory failure with hypoxia ([place]) 01/21/2018  . Pneumonia   . Aspiration pneumonia of both lower lobes due to regurgitated food ([place])   . Sepsis due to pneumonia ([place]) 11/22/2017  . AIDS ([place]) 05/14/2014  . 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 Gastroparesis 05/06/2014  . Protein-calorie malnutrition, severe ([place]) 05/05/2014  . ESRD on hemodialysis ([place]) 05/02/2014  . Hemodialysis-associated hypotension 05/02/2014  . Leaking of conjunctival drainage bleb 05/01/2014  . POAG (primary open-angle glaucoma) 05/01/2014  . Loss of weight 04/29/2014  . Conjunctivitis 04/29/2014  . Nonischemic dilated cardiomyopathy ([place]) 09/10/2013  . Low back pain 04/09/2012  . Osteoarthritis of hip 12/29/2011  . Iron deficiency anemia 06/28/2011  . Macrocytic anemia 04/02/2011  . 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 Hyperlipidemia  11/30/2010  . Transient cerebral ischemia 11/30/2010  . HYPERGLYCEMIA 11/30/2010    Past Surgical History:  Procedure Laterality Date  . AV FISTULA PLACEMENT Right 10/13/2016   Procedure: ARTERIOVENOUS (AV) FISTULA CREATION;  Surgeon: [name], MD;  Location: [place];  Service: Vascular;  Laterality: Right;  . AV FISTULA PLACEMENT Left 02/25/2018   Procedure: INSERTION OF ARTERIOVENOUS (AV) GORE-TEX GRAFT LEFT UPPER ARM;  Surgeon: Angelia Mould, MD;  Location: [place];  Service: Vascular;  Laterality: Left;  . AV FISTULA PLACEMENT Right 08/07/2018   Procedure: Creation of right arm brachiocephalic Fistula;  Surgeon: Waynetta Sandy, MD;  Location: [place];  Service: Vascular;  Laterality: Right;  . AV FISTULA PLACEMENT Left 11/10/2019   Procedure: INSERTION OF ARTERIOVENOUS (AV) GORE-TEX GRAFT THIGH;  Surgeon: Elam Dutch, MD;  Location: [place];  Service: Vascular;  Laterality: Left;  . AV FISTULA PLACEMENT Right 12/02/2019   Procedure: INSERTION OF ARTERIOVENOUS (AV) GORE-TEX GRAFT RIGHT  THIGH;  Surgeon: Waynetta Sandy, MD;  Location: [place];  Service: Vascular;  Laterality: Right;  . BASCILIC VEIN TRANSPOSITION Left 06/24/2015   Procedure: BASILIC VEIN TRANSPOSITION;  Surgeon: Mal Misty, MD;  Location: [place];  Service: Vascular;  Laterality: Left;  . BIOPSY  03/14/2019   Procedure: BIOPSY;  Surgeon: Irene Shipper, MD;  Location: [place];  Service: Endoscopy;;  . CATARACT EXTRACTION, BILATERAL Bilateral   . COLONOSCOPY WITH PROPOFOL N/A 03/14/2019   Procedure: COLONOSCOPY WITH PROPOFOL;  Surgeon: Irene Shipper, MD;  [facility] END[facility]COPY;  Service: Endoscopy;  Laterality: N/A;  . ESOPHAGOGASTRODUODENOSCOPY (EGD) WITH PROPOFOL N/A 05/05/2014   Procedure: ESOPHAGOGASTRODUODENOSCOPY (EGD) WITH PROPOFOL;  Surgeon: Missy Sabins, MD;  Location: [facility] ENDOSCOPY;  Service: Endoscopy;  Laterality: N/A;  . EYE SURGERY Bilateral    cataract surgery   . GLAUCOMA  SURGERY Bilateral   . INSERTION OF ARTERIOVENOUS (AV) ARTEGRAFT ARM  10/09/2018   Procedure: INSERTION OF ARTERIOVENOUS (AV) 36mm x 41cm ARTEGRAFT LEFT UPPER ARM;  Surgeon: Waynetta Sandy, MD;  Location: [place];  Service: Vascular;;  . INSERTION OF DIALYSIS CATHETER Right 07/14/2019   Procedure: Insertion Of Dialysis Catheter;  Surgeon: Elam Dutch, MD;  Location: [facility] OR;  Service: Vascular;  Laterality: Right;  placed right Internal Jugular  . INSERTION PROSTATE RADIATION SEED    . IR FLUORO GUIDE CV LINE RIGHT  02/18/2018  . IR REMOVAL TUN CV CATH W/O FL  12/06/2018  . IR THROMBECTOMY AV FISTULA W/THROMBOLYSIS/PTA INC/SHUNT/IMG RIGHT Right 01/26/2020  . IR THROMBECTOMY AV FISTULA W/THROMBOLYSIS/PTA INC/SHUNT/IMG RIGHT Right 02/24/2020  . IR THROMBECTOMY AV FISTULA W/THROMBOLYSIS/PTA INC/SHUNT/IMG RIGHT Right 04/07/2020  . IR US GUIDE VASC ACCESS RIGHT  02/18/2018  . IR US GUIDE VASC ACCESS RIGHT  01/26/2020  . IR US GUIDE VASC ACCESS RIGHT  04/07/2020  . LEFT HEART CATH AND CORONARY ANGIOGRAPHY N/A 02/20/2018   Procedure: LEFT HEART CATH AND CORONARY ANGIOGRAPHY;  Surgeon: Jettie Booze, MD;  Location: [place] CV LAB;;; 50% ostial OM2, 25% mLAD.  Marland Kitchen LEFT HEART CATH AND CORONARY ANGIOGRAPHY  2004   mildly depressed LV systolic fx EF 57%,DUKGUR coronaries/abdominal aorta/renal arteries.  . LOWER EXTREMITY ANGIOGRAM Left 11/17/2019   Procedure: Lower Extremity Fistulogram;  Surgeon: Marty Heck, MD;  Location: [place];  Service: Vascular;  Laterality: Left;  . NM [place]  02/21/2010   No ischemia or infarction.  EF 27%.  Marland Kitchen RADIOACTIVE SEED IMPLANT  2010   prostate cancer  . REVISION OF ARTERIOVENOUS GORETEX GRAFT Right 07/11/2019   Procedure: REVISION OF ARTERIOVENOUS GORETEX GRAFT RIGHT ARM WITH ARTEGRAFT;  Surgeon: Serafina Mitchell, MD;  Location: [place];  Service: Vascular;  Laterality: Right;  . SHUNTOGRAM Right 07/14/2019   Procedure: Shuntogram;  Surgeon:  Elam Dutch, MD;  Location: [place];  Service: Vascular;  Laterality: Right;  . THROMBECTOMY AND REVISION OF ARTERIOVENTOUS (AV) GORETEX  GRAFT Right 07/14/2019   Procedure: THROMBECTOMY AND REVISION OF ARTERIOVENTOUS (AV) GORETEX  GRAFT;  Surgeon: Elam Dutch, MD;  Location: [place];  Service: Vascular;  Laterality: Right;  . THROMBECTOMY W/ EMBOLECTOMY Left 02/26/2018   Procedure: THROMBECTOMY ARTERIOVENOUS GRAFT;  Surgeon: Angelia Mould, MD;  Location: [place];  Service: Vascular;  Laterality: Left;  . THROMBECTOMY W/ EMBOLECTOMY Left 11/17/2019   Procedure: THROMBECTOMY ARTERIOVENOUS GRAFT LEFT THIGH;  Surgeon: Marty Heck, MD;  Location: [place];  Service: Vascular;  Laterality: Left;  . TRANSTHORACIC ECHOCARDIOGRAM  02/2012   [facility]) EF 45-50% with mild global HK.  Mild LVH,LA mod. dilated,mild-mod. 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 AI.  Marland Kitchen TRANSTHORACIC ECHOCARDIOGRAM  9/'18; 3/'19   a) Severely reduced  EF.  25 from 30%.  GR 1 DD with diffuse ecchymosis. Mild AS & AI.  Mild LA/RA dilation; b) (post PEA arest):   Moderately dilated LV with moderate concentric virtually.  EF 15 to 20%.  GR 1 DD.  Paradoxical septal motion. Mild AI.  Mild LA dilation.  Poorly visualized RV.  Marland Kitchen TRANSTHORACIC ECHOCARDIOGRAM  02/2019   EF 20-25%.  Unable to assess diastolic function (A. Fib).  No LV apical thrombus.  Mild AI.  Mildly reduced RV function, however poorly visualized.  Marland Kitchen ULTRASOUND GUIDANCE FOR VASCULAR ACCESS  02/20/2018   Procedure: Ultrasound Guidance For Vascular Access;  Surgeon: Jettie Booze, MD;  Location: [place] CV LAB;  Service: Cardiovascular;;  . UPPER EXTREMITY VENOGRAPHY N/A 07/08/2018   Procedure: UPPER EXTREMITY VENOGRAPHY;  Surgeon: Waynetta Sandy, MD;  Location: [place] CV LAB;  Service: Cardiovascular;  Laterality: N/A;  Bilateral       Family History  Problem Relation Age of Onset  . Hypertension Mother    . Thyroid disease Mother   . Cholelithiasis Daughter   . Cholelithiasis Son   . Hypertension Maternal Grandmother   . Diabetes Maternal Grandmother   . Heart attack Neg Hx   . Hyperlipidemia Neg Hx     Social History   Tobacco Use  . Smoking status: Former Smoker    Packs/day: 1.00    Years: 30.00    Pack years: 30.00    Types: Cigarettes    Quit date: 2006    Years since quitting: 15.4  . Smokeless tobacco: Never Used  Vaping Use  . Vaping Use: Never used  Substance Use Topics  . Alcohol use: Yes    Alcohol/week: 1.0 standard drink    Types: 1 Shots of liquor per week    Comment: occasional  . Drug use: Never    Home Medications Prior to Admission medications   Medication Sig Start Date End Date Taking? Authorizing Provider  acetaminophen (TYLENOL) 500 MG tablet Take 1,000 mg by mouth every 6 (six) hours as needed for moderate pain.    [provider]  allopurinol (ZYLOPRIM) 100 MG tablet Take 1 tablet (100 mg total) by mouth See admin instructions. Take one tablet (100 mg) by mouth Mondays, Tuesday, Thursday, Saturday after dialysis 03/25/20   Croitoru, Dani Gobble, MD  amiodarone (PACERONE) 200 MG tablet Take 1 tablet (200 mg total) by mouth daily. Patient taking differently: Take 200 mg by mouth daily at 12 noon.  05/12/19   Croitoru, Mihai, MD  bictegravir-emtricitabine-tenofovir AF ([name]) 50-200-25 MG TABS tablet Take 1 tablet by mouth daily. Patient taking differently: Take 1 tablet by mouth daily at 12 noon.  09/29/19   Truman Hayward, MD  brimonidine (ALPHAGAN) 0.15 % ophthalmic solution Place 1 drop into the left eye 2 (two) times daily. 11/15/17   [provider]  latanoprost (XALATAN) 0.005 % ophthalmic solution Place 1 drop into the left eye at bedtime.     [provider]  loratadine (CLARITIN) 10 MG tablet Take 10 mg by mouth daily at 12 noon.     [provider]  midodrine (PROAMATINE) 10 MG tablet Take 1 tablet (10 mg  total) by mouth as directed. Twice a day on the day of HD Tuesday, Thursday, Saturday (1 in AM and 1 at [place]). Take 1/2 tablet (5mg ) a day on non HD days Patient taking differently: Take 10 mg by mouth See admin instructions. Take 20 mg in the morning of dialysis  days and 10 mg half way through treatment on dialysis days (Mon, Tues, [place], and Sat) 04/22/19   Leonie Man, MD  montelukast (SINGULAIR) 10 MG tablet Take 1 tablet (10 mg total) by mouth at bedtime. NEEDS OV/FOLLOW UP 03/23/20   [name], NP  Multiple Vitamin (MULTIVITAMIN WITH MINERALS) TABS tablet Take 1 tablet by mouth every evening.     [provider]  polyethylene glycol (MIRALAX / GLYCOLAX) 17 g packet Take 17 g by mouth every evening.    [provider]  sucroferric oxyhydroxide (VELPHORO) 500 MG chewable tablet Chew 500 mg by mouth 3 (three) times daily with meals.    [provider]  traMADol (ULTRAM) 50 MG tablet Take 1 tablet (50 mg total) by mouth every 6 (six) hours as needed. 01/27/20   Domenic Polite, MD  warfarin (COUMADIN) 5 MG tablet Take 5mg  daily for next 3days and then check INR 2/25, goal INR 2-3 05/05/20   Leonie Man, MD    Allergies    Dextromethorphan-guaifenesin, Tocotrienols, and Losartan potassium  Review of Systems   Review of Systems  Constitutional: Negative for chills and fever.  Respiratory: Negative for cough and shortness of breath.   Cardiovascular: Negative for chest pain.  Gastrointestinal: Positive for abdominal pain, nausea and vomiting. Negative for anal bleeding, blood in stool, constipation and diarrhea.  Genitourinary:       No longer makes urine.  Neurological: Negative for syncope.  All other systems reviewed and are negative.   Physical Exam Updated Vital Signs BP 130/86   Pulse 79   Temp 97.9 F (36.6 C)   Resp 15   Ht 6\' 2"  (1.88 m)   Wt 83.9 kg   SpO2 98%   BMI 23.75 kg/m   Physical Exam Vitals and nursing note reviewed.    Constitutional:      General: He is not in acute distress.    Appearance: He is well-developed. He is not toxic-appearing.  HENT:     Head: Normocephalic and atraumatic.  Eyes:     General:        Right eye: No discharge.        Left eye: No discharge.     Conjunctiva/sclera: Conjunctivae normal.  Cardiovascular:     Rate and Rhythm: Normal rate and regular rhythm.  Pulmonary:     Effort: Pulmonary effort is normal. No respiratory distress.     Breath sounds: Normal breath sounds. No wheezing, rhonchi or rales.  Chest:     Comments: Dialysis catheter present in right anterior chest wall. Abdominal:     General: There is no distension.     Palpations: Abdomen is soft.     Tenderness: There is no abdominal tenderness. There is no guarding or rebound.  Musculoskeletal:     Cervical back: Neck supple.  Skin:    General: Skin is warm and dry.     Findings: No rash.  Neurological:     Mental Status: He is alert.     Comments: Clear speech.   Psychiatric:        Behavior: Behavior normal.     ED Results / Procedures / Treatments   Labs (all labs ordered are listed, but only abnormal results are displayed) Labs Reviewed  COMPREHENSIVE METABOLIC PANEL - Abnormal; Notable for the following components:      Result Value   Potassium 5.2 (*)    Glucose, Bld 119 (*)    BUN 31 (*)  Creatinine, Ser 8.38 (*)    AST 501 (*)    ALT 489 (*)    Alkaline Phosphatase 311 (*)    Total Bilirubin 3.1 (*)    GFR calc non Af Amer 6 (*)    GFR calc Af Amer 6 (*)    All other components within normal limits  CBC - Abnormal; Notable for the following components:   RBC 2.98 (*)    Hemoglobin 10.5 (*)    HCT 32.9 (*)    MCV 110.4 (*)    MCH 35.2 (*)    RDW 16.8 (*)    All other components within normal limits  LIPASE, BLOOD  URINALYSIS, ROUTINE W REFLEX MICROSCOPIC    EKG None  Radiology US Abdomen Limited RUQ  Result Date: 05/15/2020 CLINICAL DATA:  Right upper quadrant  abdominal pain EXAM: ULTRASOUND ABDOMEN LIMITED RIGHT UPPER QUADRANT COMPARISON:  May 10, 2014 FINDINGS: Gallbladder: There is diffuse gallbladder wall thickening, measuring up to approximately 1.5 cm. There is gallbladder sludge. There are no distinct gallstones. The sonographic Percell Miller sign is negative. Common bile duct: Diameter: 5 mm Liver: No focal lesion identified. Within normal limits in parenchymal echogenicity. Portal vein is patent on color Doppler imaging with normal direction of blood flow towards the liver. Other: The right kidney is echogenic without evidence for hydronephrosis. IMPRESSION: 1. There is gallbladder wall thickening which is nonspecific. This can be seen in patients with heart failure among other causes. In the absence of a positive sonographic Murphy sign, findings are less likely to represent acute cholecystitis. 2. Echogenic right kidney consistent with medical renal disease. Electronically Signed   By: Constance Holster M.D.   On: 05/15/2020 01:15    Procedures Procedures (including critical care time)  Medications Ordered in ED Medications - No data to display  ED Course  I have reviewed the triage vital signs and the nursing notes.  Pertinent labs & imaging results that were available during my care of the patient were reviewed by me and considered in my medical decision making (see chart for details).    [facility] was evaluated in Emergency Department on 05/15/2020 for the symptoms described in the history of present illness. He/she was evaluated in the context of the global COVID-19 pandemic, which necessitated consideration that the patient might be at risk for infection with the SARS-CoV-2 virus that causes COVID-19. Institutional protocols and algorithms that pertain to the evaluation of patients at risk for COVID-19 are in a state of rapid change based on information released by regulatory bodies including the CDC and federal and state organizations.  These policies and algorithms were followed during the patient's care in the ED.  MDM Rules/Calculators/A&P                          Patient presents to the ED with complaints of abdominal pain with N/V. Nontoxic, vitals without significant abnormality, abdomen is nontender @ this time.   Additional history obtained:  Additional history obtained from chart review & nursing note review. Lab Tests:  I reviewed and interpreted labs, which included:  CBC: Anemia similar to prior ranges.  No leukocytosis CMP: Significantly elevated LFTs and total bilirubin compared to prior- patient takes BID tylenol per OTC dosing, denies alcohol use.  Additional labs consistent with ESRD.  Mild hyperkalemia. Lipase: Within normal limits Tylenol level: Within normal limits Hepatitis panel ordered and pending.  Imaging Studies ordered:  I ordered imaging studies which  included RUQ [place], I independently visualized and interpreted imaging which showed 1. There is gallbladder wall thickening which is nonspecific. This can be seen in patients with heart failure among other causes. In the absence of a positive sonographic Murphy sign, findings are less likely to represent acute cholecystitis. 2. Echogenic right kidney consistent with medical renal disease.  ED Course:  Patient with nontender abdominal exam in the emergency department, no peritoneal signs.  He does have significantly elevated LFTs, his right upper quadrant ultrasound is without significant findings.  Given the degree of elevation in his liver function testing and total bilirubin plan for admission for continued monitoring, if no improvement in LFTs or recurrence of pain he may require GI evaluation and MRCP.  This is a shared visit with supervising physician Dr. Leonides Schanz who has independently evaluated patient & provided guidance in evaluation/management/disposition, in agreement with care.   02:04: CONSULT: Discussed case with hospitalist Dr. Alcario Drought-  accepts admission.   Portions of this note were generated with [employer]obby[employer]. Dictation errors may occur despite best attempts at proofreading.  Final Clinical Impression(s) / ED Diagnoses Final diagnoses:  Elevated LFTs  Abdominal pain    Rx / DC Orders ED Discharge Orders    None       Amaryllis Dyke, PA-C 05/15/20 0233    Ward, Delice Bison, DO 05/15/20 8016

## 2020-05-14 NOTE — Progress Notes (Signed)
Virtual Visit via Video Note  I connected with [facility] on 05/14/20 at  4:00 PM EDT and verified that I am speaking with the correct person using two identifiers. He was unable to connect via video due to technical difficulties and as a result we transition the visit to telephone.  Location: Patient: home Provider: work   I discussed the limitations of evaluation and management by telemedicine and the availability of in person appointments. The patient expressed understanding and agreed to proceed. Only the patient and myself were present for today's video call.   History of Present Illness:  Patient is a 79 year old male with hx which includes ESRD, NICM/CHF who presents today with chief complaint of vomiting.Reports that his symptoms began yesterday with acid indigestion.  Yesterday evening he began to develop vomiting.  Today he reports that he is unable to keep down any food or liquid.  Every time he eats or drinks something it "comes back up."  He denies associated fever. Denies diarrhea. + abdominal bloating and pain.  He is scheduled for HD tomorrow AM.    Observations/Objective:   Gen: Awake, alert, no acute distress Resp: Breathing sounds even and non-labored Psych: calm/pleasant demeanor Neuro: Alert and Oriented x 3,  speech is clear.   Assessment and Plan:  Vomiting-given his frail health status and inability to keep down food or liquid, I have advised him to go to the emergency department for further evaluation this afternoon.  Patient verbalizes understanding and is agreeable to proceed to the ED. I attempted to call the [facility] ED to give report to charge nurse, however I was placed on a prolonged hold. I see that the patient is already being evaluated in the ER.    Follow Up Instructions:    I discussed the assessment and treatment plan with the patient. The patient was provided an opportunity to ask questions and all were answered. The patient agreed with the  plan and demonstrated an understanding of the instructions.   The patient was advised to call back or seek an in-person evaluation if the symptoms worsen or if the condition fails to improve as anticipated.  [name], NP

## 2020-05-14 NOTE — ED Notes (Signed)
Pt stated he no longer makes urine

## 2020-05-14 NOTE — ED Triage Notes (Signed)
Pt here for eval of abdominal pain after eating with vomit x 2 since yesterday. M, T, Th, S dialysis patient, last full tx yesterday.

## 2020-05-15 ENCOUNTER — Observation Stay (HOSPITAL_COMMUNITY): Payer: Medicare HMO

## 2020-05-15 ENCOUNTER — Emergency Department (HOSPITAL_COMMUNITY): Payer: Medicare HMO

## 2020-05-15 ENCOUNTER — Encounter (HOSPITAL_COMMUNITY): Payer: Self-pay | Admitting: Internal Medicine

## 2020-05-15 DIAGNOSIS — K8050 Calculus of bile duct without cholangitis or cholecystitis without obstruction: Secondary | ICD-10-CM | POA: Diagnosis not present

## 2020-05-15 DIAGNOSIS — I953 Hypotension of hemodialysis: Secondary | ICD-10-CM

## 2020-05-15 DIAGNOSIS — N3289 Other specified disorders of bladder: Secondary | ICD-10-CM | POA: Diagnosis not present

## 2020-05-15 DIAGNOSIS — R109 Unspecified abdominal pain: Secondary | ICD-10-CM | POA: Diagnosis not present

## 2020-05-15 DIAGNOSIS — R7401 Elevation of levels of liver transaminase levels: Secondary | ICD-10-CM | POA: Diagnosis present

## 2020-05-15 DIAGNOSIS — I482 Chronic atrial fibrillation, unspecified: Secondary | ICD-10-CM

## 2020-05-15 DIAGNOSIS — R7989 Other specified abnormal findings of blood chemistry: Secondary | ICD-10-CM

## 2020-05-15 DIAGNOSIS — Z21 Asymptomatic human immunodeficiency virus [HIV] infection status: Secondary | ICD-10-CM

## 2020-05-15 LAB — CBC
HCT: 29.7 % — ABNORMAL LOW (ref 39.0–52.0)
Hemoglobin: 9.6 g/dL — ABNORMAL LOW (ref 13.0–17.0)
MCH: 35 pg — ABNORMAL HIGH (ref 26.0–34.0)
MCHC: 32.3 g/dL (ref 30.0–36.0)
MCV: 108.4 fL — ABNORMAL HIGH (ref 80.0–100.0)
Platelets: 183 10*3/uL (ref 150–400)
RBC: 2.74 MIL/uL — ABNORMAL LOW (ref 4.22–5.81)
RDW: 17 % — ABNORMAL HIGH (ref 11.5–15.5)
WBC: 5 10*3/uL (ref 4.0–10.5)
nRBC: 0 % (ref 0.0–0.2)

## 2020-05-15 LAB — COMPREHENSIVE METABOLIC PANEL
ALT: 391 U/L — ABNORMAL HIGH (ref 0–44)
AST: 291 U/L — ABNORMAL HIGH (ref 15–41)
Albumin: 3.6 g/dL (ref 3.5–5.0)
Alkaline Phosphatase: 341 U/L — ABNORMAL HIGH (ref 38–126)
Anion gap: 14 (ref 5–15)
BUN: 39 mg/dL — ABNORMAL HIGH (ref 8–23)
CO2: 26 mmol/L (ref 22–32)
Calcium: 9.4 mg/dL (ref 8.9–10.3)
Chloride: 98 mmol/L (ref 98–111)
Creatinine, Ser: 9.62 mg/dL — ABNORMAL HIGH (ref 0.61–1.24)
GFR calc Af Amer: 5 mL/min — ABNORMAL LOW (ref >60)
GFR calc non Af Amer: 5 mL/min — ABNORMAL LOW (ref >60)
Glucose, Bld: 88 mg/dL (ref 70–99)
Potassium: 5.3 mmol/L — ABNORMAL HIGH (ref 3.5–5.1)
Sodium: 138 mmol/L (ref 135–145)
Total Bilirubin: 3.4 mg/dL — ABNORMAL HIGH (ref 0.3–1.2)
Total Protein: 6.7 g/dL (ref 6.5–8.1)

## 2020-05-15 LAB — HEPATITIS PANEL, ACUTE
HCV Ab: NONREACTIVE
Hep A IgM: NONREACTIVE
Hep B C IgM: NONREACTIVE
Hepatitis B Surface Ag: NONREACTIVE

## 2020-05-15 LAB — PROTIME-INR
INR: 1.2 (ref 0.8–1.2)
Prothrombin Time: 14.5 seconds (ref 11.4–15.2)

## 2020-05-15 LAB — SARS CORONAVIRUS 2 BY RT PCR (HOSPITAL ORDER, PERFORMED IN ~~LOC~~ HOSPITAL LAB): SARS Coronavirus 2: NEGATIVE

## 2020-05-15 LAB — ACETAMINOPHEN LEVEL: Acetaminophen (Tylenol), Serum: <10 ug/mL — ABNORMAL LOW (ref 10–30)

## 2020-05-15 MED ORDER — RENA-VITE PO TABS
1.0000 | ORAL_TABLET | Freq: Every day | ORAL | Status: DC
  Administered 2020-05-15 – 2020-05-17 (×3): 1 via ORAL
  Filled 2020-05-15 (×3): qty 1

## 2020-05-15 MED ORDER — WARFARIN - PHARMACIST DOSING INPATIENT: Freq: Every day | Status: DC

## 2020-05-15 MED ORDER — ONDANSETRON HCL 4 MG/2ML IJ SOLN: 4.0000 mg | Freq: Four times a day (QID) | INTRAMUSCULAR | Status: DC | PRN

## 2020-05-15 MED ORDER — WARFARIN SODIUM 2.5 MG PO TABS: 2.5000 mg | ORAL_TABLET | ORAL | Status: DC

## 2020-05-15 MED ORDER — LORATADINE 10 MG PO TABS
10.0000 mg | ORAL_TABLET | Freq: Every day | ORAL | Status: DC
  Administered 2020-05-16 – 2020-05-18 (×3): 10 mg via ORAL
  Filled 2020-05-15 (×4): qty 1

## 2020-05-15 MED ORDER — MIDODRINE HCL 5 MG PO TABS
20.0000 mg | ORAL_TABLET | ORAL | Status: DC
  Administered 2020-05-15 – 2020-05-17 (×2): 20 mg via ORAL
  Filled 2020-05-15: qty 4

## 2020-05-15 MED ORDER — TRAMADOL HCL 50 MG PO TABS: 50.0000 mg | ORAL_TABLET | Freq: Four times a day (QID) | ORAL | Status: DC | PRN

## 2020-05-15 MED ORDER — ADULT MULTIVITAMIN W/MINERALS CH: 1.0000 | ORAL_TABLET | Freq: Every evening | ORAL | Status: DC

## 2020-05-15 MED ORDER — WARFARIN SODIUM 7.5 MG PO TABS: 7.5000 mg | ORAL_TABLET | Freq: Once | ORAL | Status: DC

## 2020-05-15 MED ORDER — CHLORHEXIDINE GLUCONATE CLOTH 2 % EX PADS
6.0000 | MEDICATED_PAD | Freq: Every day | CUTANEOUS | Status: DC
  Administered 2020-05-15: 6 via TOPICAL

## 2020-05-15 MED ORDER — MIDODRINE HCL 5 MG PO TABS
10.0000 mg | ORAL_TABLET | ORAL | Status: DC
  Administered 2020-05-18 (×2): 10 mg via ORAL
  Filled 2020-05-15 (×2): qty 2

## 2020-05-15 MED ORDER — MONTELUKAST SODIUM 10 MG PO TABS
10.0000 mg | ORAL_TABLET | Freq: Every day | ORAL | Status: DC
  Administered 2020-05-15 – 2020-05-17 (×3): 10 mg via ORAL
  Filled 2020-05-15 (×3): qty 1

## 2020-05-15 MED ORDER — ONDANSETRON HCL 4 MG PO TABS: 4.0000 mg | ORAL_TABLET | Freq: Four times a day (QID) | ORAL | Status: DC | PRN

## 2020-05-15 MED ORDER — SUCROFERRIC OXYHYDROXIDE 500 MG PO CHEW
500.0000 mg | CHEWABLE_TABLET | Freq: Three times a day (TID) | ORAL | Status: DC
  Administered 2020-05-15 – 2020-05-18 (×6): 500 mg via ORAL
  Filled 2020-05-15 (×12): qty 1

## 2020-05-15 MED ORDER — GADOBUTROL 1 MMOL/ML IV SOLN
8.0000 mL | Freq: Once | INTRAVENOUS | Status: AC | PRN
  Administered 2020-05-15: 8 mL via INTRAVENOUS

## 2020-05-15 MED ORDER — CHLORHEXIDINE GLUCONATE CLOTH 2 % EX PADS
6.0000 | MEDICATED_PAD | Freq: Every day | CUTANEOUS | Status: DC
  Administered 2020-05-16: 6 via TOPICAL

## 2020-05-15 MED ORDER — AMIODARONE HCL 200 MG PO TABS
200.0000 mg | ORAL_TABLET | Freq: Every day | ORAL | Status: DC
  Administered 2020-05-16 – 2020-05-18 (×3): 200 mg via ORAL
  Filled 2020-05-15 (×4): qty 1

## 2020-05-15 MED ORDER — BICTEGRAVIR-EMTRICITAB-TENOFOV 50-200-25 MG PO TABS
1.0000 | ORAL_TABLET | Freq: Every day | ORAL | Status: DC
  Administered 2020-05-16 – 2020-05-18 (×3): 1 via ORAL
  Filled 2020-05-15 (×4): qty 1

## 2020-05-15 MED ORDER — WARFARIN SODIUM 5 MG PO TABS: 5.0000 mg | ORAL_TABLET | ORAL | Status: DC

## 2020-05-15 MED ORDER — POLYETHYLENE GLYCOL 3350 17 G PO PACK
17.0000 g | PACK | Freq: Every evening | ORAL | Status: DC
  Administered 2020-05-16 – 2020-05-17 (×2): 17 g via ORAL
  Filled 2020-05-15 (×3): qty 1

## 2020-05-15 MED ORDER — ALLOPURINOL 100 MG PO TABS
100.0000 mg | ORAL_TABLET | ORAL | Status: DC
  Administered 2020-05-17: 100 mg via ORAL
  Filled 2020-05-15: qty 1

## 2020-05-15 MED ORDER — LATANOPROST 0.005 % OP SOLN
1.0000 [drp] | Freq: Every day | OPHTHALMIC | Status: DC
  Administered 2020-05-15 – 2020-05-17 (×3): 1 [drp] via OPHTHALMIC
  Filled 2020-05-15: qty 2.5

## 2020-05-15 MED ORDER — BRIMONIDINE TARTRATE 0.15 % OP SOLN
1.0000 [drp] | Freq: Two times a day (BID) | OPHTHALMIC | Status: DC
  Administered 2020-05-15 – 2020-05-18 (×7): 1 [drp] via OPHTHALMIC
  Filled 2020-05-15: qty 5

## 2020-05-15 NOTE — Consult Note (Signed)
Reason for Consult:ab pain Referring Physician: [facility] 79 [facility].  HPI: 8 yom with mmp including esrd on hd, cad, chf (ef of 20%), on coumadin for afib, hiv presents with cc of epigastric and ruq ab pain since Thursday associated with n/v.  He was found to have [place] with gbw thickening as well as sludge in associated with transaminases in 500s and tb of 3.1.  He was admitted by medicine and his transaminases are better with tb now 3.4.  He reports no pain, n/v this am. Reports no abd surgery history.  I discussed with Dr Broadus John this am   Past Medical History:  Diagnosis Date  . 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 Acute on chronic systolic and diastolic heart failure, NYHA class 4 ([place])   . Anemia, iron deficiency 11/15/2011  . Arthritis    "hands, right knee, feet" (02/21/2018)  . Atrial fibrillation ([place])   . Cardiac arrest ([place]) 02/17/2018  . CHF (congestive heart failure) ([place])   . Chronic combined systolic and diastolic heart failure, NYHA class 3 ([place]) 06/2010   felt to be secondary to hypertensive cardiomyopathy  . Chronic lower back pain   . CKD (chronic kidney disease) stage V requiring chronic dialysis ([place])   . COVID-19 12/2019  . ESRD on dialysis [facility])    started 02/2018 [place]  . GERD (gastroesophageal reflux disease)   . Glaucoma    "I had surgery and dont have it any more"  . Gout    daily RX (02/21/2018)  . Heart murmur    "mild" per pt  . Hepatitis    years ago  . HIV (human immunodeficiency virus infection) ([place])   . HIV infection ([place])   . Hyperlipidemia   . Hypertension   . Nonischemic cardiomyopathy ([place])   . Pneumonia 11/2017  . Prostate cancer ([place])    hx of prostate; s/p radioactive seed implant 10/2009 Dr Janice Norrie  . Sinus bradycardia   . Sleep apnea    does not use a cpap  . Stroke [facility])    "mini stroke" years ago  . Wears glasses     Past Surgical History:  Procedure Laterality Date   . AV FISTULA PLACEMENT Right 10/13/2016   Procedure: ARTERIOVENOUS (AV) FISTULA CREATION;  Surgeon: [name], MD;  Location: [facility];  Service: Vascular;  Laterality: Right;  . AV FISTULA PLACEMENT Left 02/25/2018   Procedure: INSERTION OF ARTERIOVENOUS (AV) GORE-TEX GRAFT LEFT UPPER ARM;  Surgeon: Angelia Mould, MD;  Location: [facility] [place];  Service: Vascular;  Laterality: Left;  . AV FISTULA PLACEMENT Right 08/07/2018   Procedure: Creation of right arm brachiocephalic Fistula;  Surgeon: Waynetta Sandy, MD;  Location: [place];  Service: Vascular;  Laterality: Right;  . AV FISTULA PLACEMENT Left 11/10/2019   Procedure: INSERTION OF ARTERIOVENOUS (AV) GORE-TEX GRAFT THIGH;  Surgeon: Elam Dutch, MD;  Location: [place];  Service: Vascular;  Laterality: Left;  . AV FISTULA PLACEMENT Right 12/02/2019   Procedure: INSERTION OF ARTERIOVENOUS (AV) GORE-TEX GRAFT RIGHT  THIGH;  Surgeon: Waynetta Sandy, MD;  Location: [place];  Service: Vascular;  Laterality: Right;  . BASCILIC VEIN TRANSPOSITION Left 06/24/2015   Procedure: BASILIC VEIN TRANSPOSITION;  Surgeon: Mal Misty, MD;  Location: [place];  Service: Vascular;  Laterality: Left;  . BIOPSY  03/14/2019   Procedure: BIOPSY;  Surgeon: Irene Shipper, MD;  Location: [facility] ENDOSCOPY;  Service: Endoscopy;;  .  CATARACT EXTRACTION, BILATERAL Bilateral   . COLONOSCOPY WITH PROPOFOL N/A 03/14/2019   Procedure: COLONOSCOPY WITH PROPOFOL;  Surgeon: Irene Shipper, MD;  Location: [facility] ENDOSCOPY;  Service: Endoscopy;  Laterality: N/A;  . ESOPHAGOGASTRODUODENOSCOPY (EGD) WITH PROPOFOL N/A 05/05/2014   Procedure: ESOPHAGOGASTRODUODENOSCOPY (EGD) WITH PROPOFOL;  Surgeon: Missy Sabins, MD;  Location: [facility] ENDOSCOPY;  Service: Endoscopy;  Laterality: N/A;  . EYE SURGERY Bilateral    cataract surgery   . GLAUCOMA SURGERY Bilateral   . INSERTION OF ARTERIOVENOUS (AV) ARTEGRAFT ARM  10/09/2018   Procedure: INSERTION OF ARTERIOVENOUS (AV) 23mm x 41cm  ARTEGRAFT LEFT UPPER ARM;  Surgeon: Waynetta Sandy, MD;  Location: [place];  Service: Vascular;;  . INSERTION OF DIALYSIS CATHETER Right 07/14/2019   Procedure: Insertion Of Dialysis Catheter;  Surgeon: Elam Dutch, MD;  Location: [facility] OR;  Service: Vascular;  Laterality: Right;  placed right Internal Jugular  . INSERTION PROSTATE RADIATION SEED    . IR FLUORO GUIDE CV LINE RIGHT  02/18/2018  . IR REMOVAL TUN CV CATH W/O FL  12/06/2018  . IR THROMBECTOMY AV FISTULA W/THROMBOLYSIS/PTA INC/SHUNT/IMG RIGHT Right 01/26/2020  . IR THROMBECTOMY AV FISTULA W/THROMBOLYSIS/PTA INC/SHUNT/IMG RIGHT Right 02/24/2020  . IR THROMBECTOMY AV FISTULA W/THROMBOLYSIS/PTA INC/SHUNT/IMG RIGHT Right 04/07/2020  . IR US GUIDE VASC ACCESS RIGHT  02/18/2018  . IR US GUIDE VASC ACCESS RIGHT  01/26/2020  . IR US GUIDE VASC ACCESS RIGHT  04/07/2020  . LEFT HEART CATH AND CORONARY ANGIOGRAPHY N/A 02/20/2018   Procedure: LEFT HEART CATH AND CORONARY ANGIOGRAPHY;  Surgeon: Jettie Booze, MD;  Location: [place] CV LAB;;; 50% ostial OM2, 25% mLAD.  Marland Kitchen LEFT HEART CATH AND CORONARY ANGIOGRAPHY  2004   mildly depressed LV systolic fx EF 16%,XWRUEA coronaries/abdominal aorta/renal arteries.  . LOWER EXTREMITY ANGIOGRAM Left 11/17/2019   Procedure: Lower Extremity Fistulogram;  Surgeon: Marty Heck, MD;  Location: [place];  Service: Vascular;  Laterality: Left;  . NM [place]  02/21/2010   No ischemia or infarction.  EF 27%.  Marland Kitchen RADIOACTIVE SEED IMPLANT  2010   prostate cancer  . REVISION OF ARTERIOVENOUS GORETEX GRAFT Right 07/11/2019   Procedure: REVISION OF ARTERIOVENOUS GORETEX GRAFT RIGHT ARM WITH ARTEGRAFT;  Surgeon: Serafina Mitchell, MD;  Location: [place];  Service: Vascular;  Laterality: Right;  . SHUNTOGRAM Right 07/14/2019   Procedure: Shuntogram;  Surgeon: Elam Dutch, MD;  Location: [place];  Service: Vascular;  Laterality: Right;  . THROMBECTOMY AND REVISION OF ARTERIOVENTOUS (AV)  GORETEX  GRAFT Right 07/14/2019   Procedure: THROMBECTOMY AND REVISION OF ARTERIOVENTOUS (AV) GORETEX  GRAFT;  Surgeon: Elam Dutch, MD;  Location: [place];  Service: Vascular;  Laterality: Right;  . THROMBECTOMY W/ EMBOLECTOMY Left 02/26/2018   Procedure: THROMBECTOMY ARTERIOVENOUS GRAFT;  Surgeon: Angelia Mould, MD;  Location: [place];  Service: Vascular;  Laterality: Left;  . THROMBECTOMY W/ EMBOLECTOMY Left 11/17/2019   Procedure: THROMBECTOMY ARTERIOVENOUS GRAFT LEFT THIGH;  Surgeon: Marty Heck, MD;  Location: [place];  Service: Vascular;  Laterality: Left;  . TRANSTHORACIC ECHOCARDIOGRAM  02/2012   [facility]) EF 45-50% with mild global HK.  Mild LVH,LA mod. dilated,mild-mod. 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 AI.  Marland Kitchen TRANSTHORACIC ECHOCARDIOGRAM  9/'18; 3/'19   a) Severely reduced EF.  25 from 30%.  GR 1 DD with diffuse ecchymosis. Mild AS & AI.  Mild LA/RA dilation; b) (post PEA arest):   Moderately dilated LV  with moderate concentric virtually.  EF 15 to 20%.  GR 1 DD.  Paradoxical septal motion. Mild AI.  Mild LA dilation.  Poorly visualized RV.  Marland Kitchen TRANSTHORACIC ECHOCARDIOGRAM  02/2019   EF 20-25%.  Unable to assess diastolic function (A. Fib).  No LV apical thrombus.  Mild AI.  Mildly reduced RV function, however poorly visualized.  Marland Kitchen ULTRASOUND GUIDANCE FOR VASCULAR ACCESS  02/20/2018   Procedure: Ultrasound Guidance For Vascular Access;  Surgeon: Jettie Booze, MD;  Location: [place] CV LAB;  Service: Cardiovascular;;  . UPPER EXTREMITY VENOGRAPHY N/A 07/08/2018   Procedure: UPPER EXTREMITY VENOGRAPHY;  Surgeon: Waynetta Sandy, MD;  Location: [place] CV LAB;  Service: Cardiovascular;  Laterality: N/A;  Bilateral    Family History  Problem Relation Age of Onset  . Hypertension Mother   . Thyroid disease Mother   . Cholelithiasis Daughter   . Cholelithiasis Son   . Hypertension Maternal Grandmother   . Diabetes  Maternal Grandmother   . Heart attack Neg Hx   . Hyperlipidemia Neg Hx     Social History:  reports that he quit smoking about 15 years ago. His smoking use included cigarettes. He has a 30.00 pack-year smoking history. He has never used smokeless tobacco. He reports current alcohol use of about 1.0 standard drink of alcohol per week. He reports that he does not use drugs.  Allergies:  Allergies  Allergen Reactions  . Dextromethorphan-Guaifenesin Other (See Comments)    Unknown reaction  . Tocotrienols Other (See Comments)    Unknown reaction  . Losartan Potassium Other (See Comments)    Causes constipation    Medications reviewed  Results for orders placed or performed during the hospital encounter of 05/14/20 (from the past 48 hour(s))  Lipase, blood     Status: None   Collection Time: 05/14/20  4:51 PM  Result Value Ref Range   Lipase 37 11 - 51 U/L    Comment: Performed at [facility], [place] [address], [place], [place] [address]  Comprehensive metabolic panel     Status: Abnormal   Collection Time: 05/14/20  4:51 PM  Result Value Ref Range   Sodium 138 135 - 145 mmol/L   Potassium 5.2 (H) 3.5 - 5.1 mmol/L   Chloride 98 98 - 111 mmol/L   CO2 26 22 - 32 mmol/L   Glucose, Bld 119 (H) 70 - 99 mg/dL    Comment: Glucose reference range applies only to samples taken after fasting for at least 8 hours.   BUN 31 (H) 8 - 23 mg/dL   Creatinine, Ser 8.38 (H) 0.61 - 1.24 mg/dL   Calcium 9.8 8.9 - 10.3 mg/dL   Total Protein 7.4 6.5 - 8.1 g/dL   Albumin 3.9 3.5 - 5.0 g/dL   AST 501 (H) 15 - 41 U/L   ALT 489 (H) 0 - 44 U/L   Alkaline Phosphatase 311 (H) 38 - 126 U/L   Total Bilirubin 3.1 (H) 0.3 - 1.2 mg/dL   GFR calc non Af Amer 6 (L) >60 mL/min   GFR calc Af Amer 6 (L) >60 mL/min   Anion gap 14 5 - 15    Comment: Performed at [facility] [address] [place], [place] [address]  CBC     Status: Abnormal   Collection Time: 05/14/20  4:51 PM  Result Value Ref  Range   WBC 7.2 4.0 - 10.5 K/uL   RBC 2.98 (L) 4.22 -  5.81 MIL/uL   Hemoglobin 10.5 (L) 13.0 - 17.0 g/dL   HCT 32.9 (L) 39 - 52 %   MCV 110.4 (H) 80.0 - 100.0 fL   MCH 35.2 (H) 26.0 - 34.0 pg   MCHC 31.9 30.0 - 36.0 g/dL   RDW 16.8 (H) 11.5 - 15.5 %   Platelets 209 150 - 400 K/uL   nRBC 0.0 0.0 - 0.2 %    Comment: Performed at [facility] [address] [place], [place] [address]  Acetaminophen level     Status: Abnormal   Collection Time: 05/15/20 12:48 AM  Result Value Ref Range   Acetaminophen (Tylenol), Serum <10 (L) 10 - 30 ug/mL    Comment: (NOTE) Therapeutic concentrations vary significantly. A range of 10-30 ug/mL  may be an effective concentration for many patients. However, some  are best treated at concentrations outside of this range. Acetaminophen concentrations >150 ug/mL at 4 hours after ingestion  and >50 ug/mL at 12 hours after ingestion are often associated with  toxic reactions.  Performed at [facility], [place] [address] [place], [place] [address]   SARS Coronavirus 2 by RT PCR (hospital order, performed in [facility] hospital lab) Nasopharyngeal Nasopharyngeal Swab     Status: None   Collection Time: 05/15/20  2:11 AM   Specimen: Nasopharyngeal Swab  Result Value Ref Range   SARS Coronavirus 2 NEGATIVE NEGATIVE    Comment: (NOTE) SARS-CoV-2 target nucleic acids are NOT DETECTED.  The SARS-CoV-2 RNA is generally detectable in upper and lower respiratory specimens during the acute phase of infection. The lowest concentration of SARS-CoV-2 viral copies this assay can detect is 250 copies / mL. A negative result does not preclude SARS-CoV-2 infection and should not be used as the sole basis for treatment or other patient management decisions.  A negative result may occur with improper specimen collection / handling, submission of specimen other than nasopharyngeal swab, presence of viral mutation(s) within the areas targeted by this assay,  and inadequate number of viral copies (<250 copies / mL). A negative result must be combined with clinical observations, patient history, and epidemiological information.  Fact Sheet for Patients:   StrictlyIdeas.no  Fact Sheet for Healthcare Providers: BankingDealers.co.za  This test is not yet approved or  cleared by the [place] FDA and has been authorized for detection and/or diagnosis of SARS-CoV-2 by FDA under an Emergency Use Authorization (EUA).  This EUA will remain in effect (meaning this test can be used) for the duration of the COVID-19 declaration under Section 564(b)(1) of the Act, 21 U.S.C. section 360bbb-3(b)(1), unless the authorization is terminated or revoked sooner.  Performed at [facility], [place] [address] [place], [place] [address]   CBC     Status: Abnormal   Collection Time: 05/15/20  4:52 AM  Result Value Ref Range   WBC 5.0 4.0 - 10.5 K/uL   RBC 2.74 (L) 4.22 - 5.81 MIL/uL   Hemoglobin 9.6 (L) 13.0 - 17.0 g/dL   HCT 29.7 (L) 39 - 52 %   MCV 108.4 (H) 80.0 - 100.0 fL   MCH 35.0 (H) 26.0 - 34.0 pg   MCHC 32.3 30.0 - 36.0 g/dL   RDW 17.0 (H) 11.5 - 15.5 %   Platelets 183 150 - 400 K/uL   nRBC 0.0 0.0 - 0.2 %    Comment: Performed at [facility] [address], [place], [place] [address]  Comprehensive metabolic panel  Status: Abnormal   Collection Time: 05/15/20  4:52 AM  Result Value Ref Range   Sodium 138 135 - 145 mmol/L   Potassium 5.3 (H) 3.5 - 5.1 mmol/L   Chloride 98 98 - 111 mmol/L   CO2 26 22 - 32 mmol/L   Glucose, Bld 88 70 - 99 mg/dL    Comment: Glucose reference range applies only to samples taken after fasting for at least 8 hours.   BUN 39 (H) 8 - 23 mg/dL   Creatinine, Ser 9.62 (H) 0.61 - 1.24 mg/dL   Calcium 9.4 8.9 - 10.3 mg/dL   Total Protein 6.7 6.5 - 8.1 g/dL   Albumin 3.6 3.5 - 5.0 g/dL   AST 291 (H) 15 - 41 U/L   ALT 391 (H) 0 - 44 U/L   Alkaline  Phosphatase 341 (H) 38 - 126 U/L   Total Bilirubin 3.4 (H) 0.3 - 1.2 mg/dL   GFR calc non Af Amer 5 (L) >60 mL/min   GFR calc Af Amer 5 (L) >60 mL/min   Anion gap 14 5 - 15    Comment: Performed at [facility] [address] [place], [place] [address]  Protime-INR     Status: None   Collection Time: 05/15/20  4:52 AM  Result Value Ref Range   Prothrombin Time 14.5 11.4 - 15.2 seconds   INR 1.2 0.8 - 1.2    Comment: (NOTE) INR goal varies based on device and disease states. Performed at [facility], [place] [address] [place], [place] [address]     US Abdomen Limited RUQ  Result Date: 05/15/2020 CLINICAL DATA:  Right upper quadrant abdominal pain EXAM: ULTRASOUND ABDOMEN LIMITED RIGHT UPPER QUADRANT COMPARISON:  May 10, 2014 FINDINGS: Gallbladder: There is diffuse gallbladder wall thickening, measuring up to approximately 1.5 cm. There is gallbladder sludge. There are no distinct gallstones. The sonographic Percell Miller sign is negative. Common bile duct: Diameter: 5 mm Liver: No focal lesion identified. Within normal limits in parenchymal echogenicity. Portal vein is patent on color Doppler imaging with normal direction of blood flow towards the liver. Other: The right kidney is echogenic without evidence for hydronephrosis. IMPRESSION: 1. There is gallbladder wall thickening which is nonspecific. This can be seen in patients with heart failure among other causes. In the absence of a positive sonographic Murphy sign, findings are less likely to represent acute cholecystitis. 2. Echogenic right kidney consistent with medical renal disease. Electronically Signed   By: Constance Holster M.D.   On: 05/15/2020 01:15    Review of Systems  Gastrointestinal: Positive for abdominal pain, nausea and vomiting.  All other systems reviewed and are negative.  Blood pressure (!) 142/74, pulse 78, temperature 98 F (36.7 C), temperature source Oral, resp. rate 18, height 6\' 2"  (1.88 m), weight  83.9 kg, SpO2 100 %. Physical Exam  Constitutional: He appears well-developed.  HENT:  Head: Normocephalic and atraumatic.  Mouth/Throat: Oropharynx is clear.  Eyes: No scleral icterus.  Cardiovascular: Normal rate, regular rhythm and normal heart sounds.  Respiratory: Effort normal and breath sounds normal.  GI: Bowel sounds are normal. He exhibits no distension. There is no abdominal tenderness.  Neurological: He is alert.  Skin: Skin is warm and dry. Capillary refill takes less than 2 seconds. No rash noted.  Psychiatric: His behavior is normal. Mood normal.    Assessment/Plan: Gallbladder sludge Elevated LFTs -this certainly appears that he may have passed a small stone -discussed MRCP as next step.  Follow lfts in am -cardiology to evaluated preop evaluation to determine if this is steady state or if there is any way to maximize him prior to surgery -I think will likely be recommendation for lap chole at some point and will await mrcp and cards recs   Rolm Bookbinder 05/15/2020, 11:01 AM

## 2020-05-15 NOTE — ED Notes (Signed)
Unable to start IV. Dr. Alcario Drought made aware and does not see need for IV at this time.

## 2020-05-15 NOTE — Progress Notes (Addendum)
ANTICOAGULATION CONSULT NOTE - Initial Consult  Pharmacy Consult for Coumadin Indication: atrial fibrillation  Allergies  Allergen Reactions  . Dextromethorphan-Guaifenesin Other (See Comments)    Unknown reaction  . Tocotrienols Other (See Comments)    Unknown reaction  . Losartan Potassium Other (See Comments)    Causes constipation    Patient Measurements: Height: 6\' 2"  (188 cm) Weight: 83.9 kg (185 lb) IBW/kg (Calculated) : 82.2  Vital Signs: Temp: 97.7 F (36.5 C) (06/12 0326) Temp Source: Oral (06/12 0326) BP: 152/76 (06/12 0326) Pulse Rate: 76 (06/12 0326)  Labs: Recent Labs    05/14/20 1651 05/15/20 0452  HGB 10.5* 9.6*  HCT 32.9* 29.7*  PLT 209 183  LABPROT  --  14.5  INR  --  1.2  CREATININE 8.38* 9.62*    Estimated Creatinine Clearance: 7.5 mL/min (A) (by C-G formula based on SCr of 9.62 mg/dL (H)).   Medical History: Past Medical History:  Diagnosis Date  . 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 Acute on chronic systolic and diastolic heart failure, NYHA class 4 ([place])   . Anemia, iron deficiency 11/15/2011  . Arthritis    "hands, right knee, feet" (02/21/2018)  . Atrial fibrillation ([place])   . Cardiac arrest ([place]) 02/17/2018  . CHF (congestive heart failure) ([place])   . Chronic combined systolic and diastolic heart failure, NYHA class 3 ([place]) 06/2010   felt to be secondary to hypertensive cardiomyopathy  . Chronic lower back pain   . CKD (chronic kidney disease) stage V requiring chronic dialysis ([place])   . COVID-19 12/2019  . ESRD on dialysis [facility])    started 02/2018 [place]  . GERD (gastroesophageal reflux disease)   . Glaucoma    "I had surgery and dont have it any more"  . Gout    daily RX (02/21/2018)  . Heart murmur    "mild" per pt  . Hepatitis    years ago  . HIV (human immunodeficiency virus infection) ([place])   . HIV infection ([place])   . Hyperlipidemia   . Hypertension   . Nonischemic cardiomyopathy  ([place])   . Pneumonia 11/2017  . Prostate cancer ([place])    hx of prostate; s/p radioactive seed implant 10/2009 Dr Janice Norrie  . Sinus bradycardia   . Sleep apnea    does not use a cpap  . Stroke [facility])    "mini stroke" years ago  . Wears glasses     Assessment: 79yo male c/o abdominal pain and vomiting, admitted for transaminitis, to continue Coumadin for Afib; last dose taken 6/10.  INR low at 1.2  Home Coumadin regimen of 5mg  daily except 2.5mg  MonFri.  Goal of Therapy:  INR 2-3   Plan:  Holding warfarin per Dr. Broadus John F/u resuming once work up complete Watch for signs/symptoms of bleeding  Sherren Kerns, PharmD PGY1 [facility] Resident

## 2020-05-15 NOTE — Progress Notes (Signed)
New Admission Note:   Arrival Method: from ED via wheelchair Mental Orientation: Alert & Oriented x4 Telemetry: N/A Assessment: Completed Skin: Intact, dry and flaky, warm to touch IV:  No IV line, MD aware Pain: 0/10 Tubes: None Safety Measures: Safety Fall Prevention Plan has been discussed  Admission: to be completed 5 [place] Orientation: Patient has been orientated to the room, unit and staff.   Family: none at bedside  Orders to be reviewed and implemented. Will continue to monitor the patient. Call light has been placed within reach and bed alarm has been activated.

## 2020-05-15 NOTE — H&P (Signed)
History and Physical    [facility] RUE:[id number] DOB: 10-12-1941 DOA: 05/14/2020  PCP: [name], NP  Patient coming from: Home  I have personally briefly reviewed patient's old medical records in [place]  Chief Complaint: Abd pain  HPI: [facility] Betzold is a 79 y.o. 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  Pt presents to ED with c/o abd pain.  Onset yesterday after eating.  Persisted until today.  Pain was burning / acid like discomfort.  Tried peptobismol this AM, seemed to help some.  Pain currently resolved completely.   ED Course: pain remains resolved in ED.  Lab work however is concerning for LFTs in the 500s and Tbili of 3.1  RUQ [place]: gall bladder wall thickening and sludge, but no murphy's sign, radiologist favored CHF findings over acute cholecystitis.  EDP wants admit for obs.   Review of Systems: As per HPI, otherwise all review of systems negative.  Past Medical History:  Diagnosis Date  . 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 Acute on chronic systolic and diastolic heart failure, NYHA class 4 ([place])   . Anemia, iron deficiency 11/15/2011  . Arthritis    "hands, right knee, feet" (02/21/2018)  . Atrial fibrillation ([place])   . Cardiac arrest ([place]) 02/17/2018  . CHF (congestive heart failure) ([place])   . Chronic combined systolic and diastolic heart failure, NYHA class 3 ([place]) 06/2010   felt to be secondary to hypertensive cardiomyopathy  . Chronic lower back pain   . CKD (chronic kidney disease) stage V requiring chronic dialysis ([place])   . COVID-19 12/2019  . ESRD on dialysis [facility])    started 02/2018 [place]  . GERD (gastroesophageal reflux disease)   . Glaucoma    "I had surgery and dont have it any more"  . Gout    daily RX (02/21/2018)  . Heart murmur    "mild" per pt  . Hepatitis    years ago  . HIV (human immunodeficiency virus infection)  ([place])   . HIV infection ([place])   . Hyperlipidemia   . Hypertension   . Nonischemic cardiomyopathy ([place])   . Pneumonia 11/2017  . Prostate cancer ([place])    hx of prostate; s/p radioactive seed implant 10/2009 Dr Janice Norrie  . Sinus bradycardia   . Sleep apnea    does not use a cpap  . Stroke [facility])    "mini stroke" years ago  . Wears glasses     Past Surgical History:  Procedure Laterality Date  . AV FISTULA PLACEMENT Right 10/13/2016   Procedure: ARTERIOVENOUS (AV) FISTULA CREATION;  Surgeon: [name], MD;  Location: [place];  Service: Vascular;  Laterality: Right;  . AV FISTULA PLACEMENT Left 02/25/2018   Procedure: INSERTION OF ARTERIOVENOUS (AV) GORE-TEX GRAFT LEFT UPPER ARM;  Surgeon: Angelia Mould, MD;  Location: [place];  Service: Vascular;  Laterality: Left;  . AV FISTULA PLACEMENT Right 08/07/2018   Procedure: Creation of right arm brachiocephalic Fistula;  Surgeon: Waynetta Sandy, MD;  Location: [place];  Service: Vascular;  Laterality: Right;  . AV FISTULA PLACEMENT Left 11/10/2019   Procedure: INSERTION OF ARTERIOVENOUS (AV) GORE-TEX GRAFT THIGH;  Surgeon: Elam Dutch, MD;  Location: [place];  Service: Vascular;  Laterality: Left;  . AV FISTULA PLACEMENT Right 12/02/2019   Procedure: INSERTION OF ARTERIOVENOUS (AV) GORE-TEX GRAFT RIGHT  THIGH;  Surgeon: Waynetta Sandy, MD;  Location: [facility]  OR;  Service: Vascular;  Laterality: Right;  . BASCILIC VEIN TRANSPOSITION Left 06/24/2015   Procedure: BASILIC VEIN TRANSPOSITION;  Surgeon: Mal Misty, MD;  Location: [place];  Service: Vascular;  Laterality: Left;  . BIOPSY  03/14/2019   Procedure: BIOPSY;  Surgeon: Irene Shipper, MD;  Location: [place];  Service: Endoscopy;;  . CATARACT EXTRACTION, BILATERAL Bilateral   . COLONOSCOPY WITH PROPOFOL N/A 03/14/2019   Procedure: COLONOSCOPY WITH PROPOFOL;  Surgeon: Irene Shipper, MD;  Location: [facility] ENDOSCOPY;  Service: Endoscopy;  Laterality: N/A;  .  ESOPHAGOGASTRODUODENOSCOPY (EGD) WITH PROPOFOL N/A 05/05/2014   Procedure: ESOPHAGOGASTRODUODENOSCOPY (EGD) WITH PROPOFOL;  Surgeon: Missy Sabins, MD;  Location: [facility] ENDOSCOPY;  Service: Endoscopy;  Laterality: N/A;  . EYE SURGERY Bilateral    cataract surgery   . GLAUCOMA SURGERY Bilateral   . INSERTION OF ARTERIOVENOUS (AV) ARTEGRAFT ARM  10/09/2018   Procedure: INSERTION OF ARTERIOVENOUS (AV) 73mm x 41cm ARTEGRAFT LEFT UPPER ARM;  Surgeon: Waynetta Sandy, MD;  Location: [place];  Service: Vascular;;  . INSERTION OF DIALYSIS CATHETER Right 07/14/2019   Procedure: Insertion Of Dialysis Catheter;  Surgeon: Elam Dutch, MD;  Location: [name] OR;  Service: Vascular;  Laterality: Right;  placed right Internal Jugular  . INSERTION PROSTATE RADIATION SEED    . IR FLUORO GUIDE CV LINE RIGHT  02/18/2018  . IR REMOVAL TUN CV CATH W/O FL  12/06/2018  . IR THROMBECTOMY AV FISTULA W/THROMBOLYSIS/PTA INC/SHUNT/IMG RIGHT Right 01/26/2020  . IR THROMBECTOMY AV FISTULA W/THROMBOLYSIS/PTA INC/SHUNT/IMG RIGHT Right 02/24/2020  . IR THROMBECTOMY AV FISTULA W/THROMBOLYSIS/PTA INC/SHUNT/IMG RIGHT Right 04/07/2020  . IR US GUIDE VASC ACCESS RIGHT  02/18/2018  . IR US GUIDE VASC ACCESS RIGHT  01/26/2020  . IR US GUIDE VASC ACCESS RIGHT  04/07/2020  . LEFT HEART CATH AND CORONARY ANGIOGRAPHY N/A 02/20/2018   Procedure: LEFT HEART CATH AND CORONARY ANGIOGRAPHY;  Surgeon: Jettie Booze, MD;  Location: [place] CV LAB;;; 50% ostial OM2, 25% mLAD.  Marland Kitchen LEFT HEART CATH AND CORONARY ANGIOGRAPHY  2004   mildly depressed LV systolic fx EF 31%,SHFWYO coronaries/abdominal aorta/renal arteries.  . LOWER EXTREMITY ANGIOGRAM Left 11/17/2019   Procedure: Lower Extremity Fistulogram;  Surgeon: Marty Heck, MD;  Location: [place];  Service: Vascular;  Laterality: Left;  . NM [place]  02/21/2010   No ischemia or infarction.  EF 27%.  Marland Kitchen RADIOACTIVE SEED IMPLANT  2010   prostate cancer  . REVISION OF  ARTERIOVENOUS GORETEX GRAFT Right 07/11/2019   Procedure: REVISION OF ARTERIOVENOUS GORETEX GRAFT RIGHT ARM WITH ARTEGRAFT;  Surgeon: Serafina Mitchell, MD;  Location: [place];  Service: Vascular;  Laterality: Right;  . SHUNTOGRAM Right 07/14/2019   Procedure: Shuntogram;  Surgeon: Elam Dutch, MD;  Location: [place];  Service: Vascular;  Laterality: Right;  . THROMBECTOMY AND REVISION OF ARTERIOVENTOUS (AV) GORETEX  GRAFT Right 07/14/2019   Procedure: THROMBECTOMY AND REVISION OF ARTERIOVENTOUS (AV) GORETEX  GRAFT;  Surgeon: Elam Dutch, MD;  Location: [place];  Service: Vascular;  Laterality: Right;  . THROMBECTOMY W/ EMBOLECTOMY Left 02/26/2018   Procedure: THROMBECTOMY ARTERIOVENOUS GRAFT;  Surgeon: Angelia Mould, MD;  Location: [place];  Service: Vascular;  Laterality: Left;  . THROMBECTOMY W/ EMBOLECTOMY Left 11/17/2019   Procedure: THROMBECTOMY ARTERIOVENOUS GRAFT LEFT THIGH;  Surgeon: Marty Heck, MD;  Location: [place];  Service: Vascular;  Laterality: Left;  . TRANSTHORACIC ECHOCARDIOGRAM  02/2012   [facility]) EF 45-50% with  mild global HK.  Mild LVH,LA mod. dilated,mild-mod. 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 AI.  Marland Kitchen TRANSTHORACIC ECHOCARDIOGRAM  9/'18; 3/'19   a) Severely reduced EF.  25 from 30%.  GR 1 DD with diffuse ecchymosis. Mild AS & AI.  Mild LA/RA dilation; b) (post PEA arest):   Moderately dilated LV with moderate concentric virtually.  EF 15 to 20%.  GR 1 DD.  Paradoxical septal motion. Mild AI.  Mild LA dilation.  Poorly visualized RV.  Marland Kitchen TRANSTHORACIC ECHOCARDIOGRAM  02/2019   EF 20-25%.  Unable to assess diastolic function (A. Fib).  No LV apical thrombus.  Mild AI.  Mildly reduced RV function, however poorly visualized.  Marland Kitchen ULTRASOUND GUIDANCE FOR VASCULAR ACCESS  02/20/2018   Procedure: Ultrasound Guidance For Vascular Access;  Surgeon: Jettie Booze, MD;  Location: [place] CV LAB;  Service: Cardiovascular;;  . UPPER  EXTREMITY VENOGRAPHY N/A 07/08/2018   Procedure: UPPER EXTREMITY VENOGRAPHY;  Surgeon: Waynetta Sandy, MD;  Location: [place] CV LAB;  Service: Cardiovascular;  Laterality: N/A;  Bilateral     reports that he quit smoking about 15 years ago. His smoking use included cigarettes. He has a 30.00 pack-year smoking history. He has never used smokeless tobacco. He reports current alcohol use of about 1.0 standard drink of alcohol per week. He reports that he does not use drugs.  Allergies  Allergen Reactions  . Dextromethorphan-Guaifenesin Other (See Comments)    Unknown reaction  . Tocotrienols Other (See Comments)    Unknown reaction  . Losartan Potassium Other (See Comments)    Causes constipation    Family History  Problem Relation Age of Onset  . Hypertension Mother   . Thyroid disease Mother   . Cholelithiasis Daughter   . Cholelithiasis Son   . Hypertension Maternal Grandmother   . Diabetes Maternal Grandmother   . Heart attack Neg Hx   . Hyperlipidemia Neg Hx      Prior to Admission medications   Medication Sig Start Date End Date Taking? Authorizing Provider  acetaminophen (TYLENOL) 500 MG tablet Take 1,000 mg by mouth every 6 (six) hours as needed for moderate pain.   Yes [provider]  allopurinol (ZYLOPRIM) 100 MG tablet Take 1 tablet (100 mg total) by mouth See admin instructions. Take one tablet (100 mg) by mouth Mondays, Tuesday, Thursday, Saturday after dialysis 03/25/20  Yes Croitoru, Mihai, MD  amiodarone (PACERONE) 200 MG tablet Take 1 tablet (200 mg total) by mouth daily. Patient taking differently: Take 200 mg by mouth daily at 12 noon.  05/12/19  Yes Croitoru, Mihai, MD  bictegravir-emtricitabine-tenofovir AF ([name]) 50-200-25 MG TABS tablet Take 1 tablet by mouth daily. Patient taking differently: Take 1 tablet by mouth daily at 12 noon.  09/29/19  Yes Tommy Medal, Lavell Islam, MD  brimonidine (ALPHAGAN) 0.15 % ophthalmic solution Place 1 drop  into the right eye 2 (two) times daily.  11/15/17  Yes [provider]  latanoprost (XALATAN) 0.005 % ophthalmic solution Place 1 drop into the right eye at bedtime.    Yes [provider]  loratadine (CLARITIN) 10 MG tablet Take 10 mg by mouth daily at 12 noon.    Yes [provider]  midodrine (PROAMATINE) 10 MG tablet Take 1 tablet (10 mg total) by mouth as directed. Twice a day on the day of HD Tuesday, Thursday, Saturday (1 in AM and 1 at [place]). Take 1/2 tablet (5mg ) a day on non HD  days Patient taking differently: Take 10 mg by mouth See admin instructions. Take 20 mg in the morning of dialysis days and 10 mg half way through treatment on dialysis days (Mon, Tues, [place], and Sat) 04/22/19  Yes Leonie Man, MD  montelukast (SINGULAIR) 10 MG tablet Take 1 tablet (10 mg total) by mouth at bedtime. NEEDS OV/FOLLOW UP 03/23/20  Yes [name], NP  Multiple Vitamin (MULTIVITAMIN WITH MINERALS) TABS tablet Take 1 tablet by mouth every evening.    Yes [provider]  polyethylene glycol (MIRALAX / GLYCOLAX) 17 g packet Take 17 g by mouth every evening.   Yes [provider]  sucroferric oxyhydroxide (VELPHORO) 500 MG chewable tablet Chew 500 mg by mouth 3 (three) times daily with meals.   Yes [provider]  traMADol (ULTRAM) 50 MG tablet Take 1 tablet (50 mg total) by mouth every 6 (six) hours as needed. Patient taking differently: Take 50 mg by mouth every 6 (six) hours as needed for moderate pain.  01/27/20  Yes Domenic Polite, MD  warfarin (COUMADIN) 5 MG tablet Take 5mg  daily for next 3days and then check INR 2/25, goal INR 2-3 Patient taking differently: Take 5 mg by mouth See admin instructions. On Monday, Tuesday, Thursday and Saturday 05/05/20  Yes Leonie Man, MD    Physical Exam: Vitals:   05/14/20 1647 05/14/20 2056 05/15/20 0030 05/15/20 0200  BP: (!) 151/72 130/86 (!) 129/108 140/76  Pulse: 83 79 77 71  Resp: 16  15 16 17   Temp: 98.8 F (37.1 C) 97.9 F (36.6 C)    TempSrc: Oral     SpO2: 99% 98% 100% 100%  Weight: 83.9 kg     Height: 6\' 2"  (1.88 m)       Constitutional: NAD, calm, comfortable Eyes: PERRL, lids and conjunctivae normal ENMT: Mucous membranes are moist. Posterior pharynx clear of any exudate or lesions.Normal dentition.  Neck: normal, supple, no masses, no thyromegaly Respiratory: clear to auscultation bilaterally, no wheezing, no crackles. Normal respiratory effort. No accessory muscle use.  Cardiovascular: Regular rate and rhythm, no murmurs / rubs / gallops. No extremity edema. 2+ pedal pulses. No carotid bruits.  Abdomen: no tenderness, no masses palpated. No hepatosplenomegaly. Bowel sounds positive.  Musculoskeletal: no clubbing / cyanosis. No joint deformity upper and lower extremities. Good ROM, no contractures. Normal muscle tone.  Skin: no rashes, lesions, ulcers. No induration Neurologic: CN 2-12 grossly intact. Sensation intact, DTR normal. Strength 5/5 in all 4.  Psychiatric: Normal judgment and insight. Alert and oriented x 3. Normal mood.    Labs on Admission: I have personally reviewed following labs and imaging studies  CBC: Recent Labs  Lab 05/14/20 1651  WBC 7.2  HGB 10.5*  HCT 32.9*  MCV 110.4*  PLT 470   Basic Metabolic Panel: Recent Labs  Lab 05/14/20 1651  NA 138  K 5.2*  CL 98  CO2 26  GLUCOSE 119*  BUN 31*  CREATININE 8.38*  CALCIUM 9.8   GFR: Estimated Creatinine Clearance: 8.6 mL/min (A) (by C-G formula based on SCr of 8.38 mg/dL (H)). Liver Function Tests: Recent Labs  Lab 05/14/20 1651  AST 501*  ALT 489*  ALKPHOS 311*  BILITOT 3.1*  PROT 7.4  ALBUMIN 3.9   Recent Labs  Lab 05/14/20 1651  LIPASE 37   No results for input(s): AMMONIA in the last 168 hours. Coagulation Profile: No results for input(s): INR, PROTIME in the last 168 hours. Cardiac Enzymes: No results  for input(s): CKTOTAL, CKMB, CKMBINDEX, TROPONINI  in the last 168 hours. BNP (last 3 results) No results for input(s): PROBNP in the last 8760 hours. HbA1C: No results for input(s): HGBA1C in the last 72 hours. CBG: No results for input(s): GLUCAP in the last 168 hours. Lipid Profile: No results for input(s): CHOL, HDL, LDLCALC, TRIG, CHOLHDL, LDLDIRECT in the last 72 hours. Thyroid Function Tests: No results for input(s): TSH, T4TOTAL, FREET4, T3FREE, THYROIDAB in the last 72 hours. Anemia Panel: No results for input(s): VITAMINB12, FOLATE, FERRITIN, TIBC, IRON, RETICCTPCT in the last 72 hours. Urine analysis:    Component Value Date/Time   COLORURINE YELLOW 02/17/2018 0540   APPEARANCEUR CLOUDY (A) 02/17/2018 0540   LABSPEC 1.010 02/17/2018 0540   PHURINE 5.0 02/17/2018 0540   GLUCOSEU NEGATIVE 02/17/2018 0540   HGBUR LARGE (A) 02/17/2018 0540   BILIRUBINUR NEGATIVE 02/17/2018 0540   BILIRUBINUR negative 04/16/2013 [place] 02/17/2018 0540   PROTEINUR 100 (A) 02/17/2018 0540   UROBILINOGEN 0.2 05/11/2014 1352   NITRITE NEGATIVE 02/17/2018 0540   LEUKOCYTESUR NEGATIVE 02/17/2018 0540    Radiological Exams on Admission: US Abdomen Limited RUQ  Result Date: 05/15/2020 CLINICAL DATA:  Right upper quadrant abdominal pain EXAM: ULTRASOUND ABDOMEN LIMITED RIGHT UPPER QUADRANT COMPARISON:  May 10, 2014 FINDINGS: Gallbladder: There is diffuse gallbladder wall thickening, measuring up to approximately 1.5 cm. There is gallbladder sludge. There are no distinct gallstones. The sonographic Percell Miller sign is negative. Common bile duct: Diameter: 5 mm Liver: No focal lesion identified. Within normal limits in parenchymal echogenicity. Portal vein is patent on color Doppler imaging with normal direction of blood flow towards the liver. Other: The right kidney is echogenic without evidence for hydronephrosis. IMPRESSION: 1. There is gallbladder wall thickening which is nonspecific. This can be seen in patients with heart failure  among other causes. In the absence of a positive sonographic Murphy sign, findings are less likely to represent acute cholecystitis. 2. Echogenic right kidney consistent with medical renal disease. Electronically Signed   By: Constance Holster M.D.   On: 05/15/2020 01:15    EKG: Independently reviewed.  Assessment/Plan Principal Problem:   Transaminitis Active Problems:   ESRD on hemodialysis (HCC)   Hemodialysis-associated hypotension   HIV (human immunodeficiency virus infection) (HCC)   Atrial fibrillation, chronic ([place])    1. Transaminitis - 1. Had abd pain but this resolved at this time 2. Wondering if pt may have passed a gallstone? 3. Repeat LFTs in AM 4. Hepatitis pnl pending 5. If LFTs not downtrending rapidly, needs GI consult 6. Will try to start pt on diet to see how he does given that he is symptom free at this time. 2. ESRD - 1. Call nephro in AM for routine dialysis 2. Cont midodrine 3. HIV - 1. Cont HAART 4. A.fib - 1. Cont amiodarone 2. Cont coumadin  DVT prophylaxis: Coumadin Code Status: Full code per patient Family Communication: No family in room Disposition Plan: Home after admit Consults called: None Admission status: Place in 32   Rosella Crandell, [place] [facility]  How to contact the [facility] Attending or Consulting provider [place] or covering provider during after hours [place], for this patient?  1. Check the care team in [facility] and look for a) attending/consulting [facility] provider listed and b) the [facility] team listed 2. Log into [email or web address]  Amion Physician Scheduling and messaging for groups and whole hospitals  On call and physician scheduling software for group practices,  residents, hospitalists and other medical providers for call, clinic, rotation and shift schedules. On[employer] is a hospital-wide system for scheduling doctors and paging doctors on call. EasyPlot is for scientific plotting and data analysis.  [email or web address]  and use  [address]'s universal password to access. If you do not have the password, please contact the hospital operator.  3. Locate the [facility] provider you are looking for under [facility] and page to a number that you can be directly reached. 4. If you still have difficulty reaching the provider, please page the [facility] (Director on Call) for the Hospitalists listed on amion for assistance.  05/15/2020, 2:28 AM

## 2020-05-15 NOTE — Consult Note (Addendum)
[facility] Renal Consultation Note    Indication for Consultation:  Management of ESRD/hemodialysis, anemia, hypertension/volume, and secondary hyperparathyroidism.  HPI: David [name] is a 79 y.o. 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 Fib, HIV, HTN who presented to the ED on 05/14/20 with abdominal pain and nausea. Patient reports he began experiencing acute onset epigastric abdominal pain and nausea on 05/13/20 after eating. He tried pepto bismol with some relief. On presentation to the ED, pain improved however LFTs were significantly elevated in the 500s and Tbili 3.1. RUQ US showed gall bladder wall thickening and sludge. GI was consulted and recommended MRCP as next step with cariology eval preop.   On exam, patient reports he is feeling well. He denies any abdominal pain, nausea or vomiting. He reports MRCP is scheduled for this afternoon. Has a hx of recurrent pulm edema requiring HD 4 times per week but is currently at his outpatient EDW, likely due to nausea and poor PO intake. He denies SOB, orthopnea, CP, palpitations, abdominal pain, N/V/D.   Past Medical History:  Diagnosis Date  . 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 Acute on chronic systolic and diastolic heart failure, NYHA class 4 ([place])   . Anemia, iron deficiency 11/15/2011  . Arthritis    "hands, right knee, feet" (02/21/2018)  . Atrial fibrillation ([place])   . Cardiac arrest ([place]) 02/17/2018  . CHF (congestive heart failure) ([place])   . Chronic combined systolic and diastolic heart failure, NYHA class 3 ([place]) 06/2010   felt to be secondary to hypertensive cardiomyopathy  . Chronic lower back pain   . CKD (chronic kidney disease) stage V requiring chronic dialysis ([place])   . COVID-19 12/2019  . ESRD on dialysis [facility])    started 02/2018 [place]  . GERD (gastroesophageal reflux disease)   . Glaucoma    "I had surgery and dont  have it any more"  . Gout    daily RX (02/21/2018)  . Heart murmur    "mild" per pt  . Hepatitis    years ago  . HIV (human immunodeficiency virus infection) ([place])   . HIV infection ([place])   . Hyperlipidemia   . Hypertension   . Nonischemic cardiomyopathy ([place])   . Pneumonia 11/2017  . Prostate cancer ([place])    hx of prostate; s/p radioactive seed implant 10/2009 Dr Janice Norrie  . Sinus bradycardia   . Sleep apnea    does not use a cpap  . Stroke [facility])    "mini stroke" years ago  . Wears glasses    Past Surgical History:  Procedure Laterality Date  . AV FISTULA PLACEMENT Right 10/13/2016   Procedure: ARTERIOVENOUS (AV) FISTULA CREATION;  Surgeon: [name], MD;  Location: [place];  Service: Vascular;  Laterality: Right;  . AV FISTULA PLACEMENT Left 02/25/2018   Procedure: INSERTION OF ARTERIOVENOUS (AV) GORE-TEX GRAFT LEFT UPPER ARM;  Surgeon: Angelia Mould, MD;  Location: [place];  Service: Vascular;  Laterality: Left;  . AV FISTULA PLACEMENT Right 08/07/2018   Procedure: Creation of right arm brachiocephalic Fistula;  Surgeon: Waynetta Sandy, MD;  Location: [place];  Service: Vascular;  Laterality: Right;  . AV FISTULA PLACEMENT Left 11/10/2019   Procedure: INSERTION OF ARTERIOVENOUS (AV) GORE-TEX GRAFT THIGH;  Surgeon: Elam Dutch, MD;  Location: [place];  Service: Vascular;  Laterality: Left;  . AV FISTULA PLACEMENT Right 12/02/2019   Procedure: INSERTION OF ARTERIOVENOUS (  AV) GORE-TEX GRAFT RIGHT  THIGH;  Surgeon: Waynetta Sandy, MD;  Location: [place];  Service: Vascular;  Laterality: Right;  . BASCILIC VEIN TRANSPOSITION Left 06/24/2015   Procedure: BASILIC VEIN TRANSPOSITION;  Surgeon: Mal Misty, MD;  Location: [place];  Service: Vascular;  Laterality: Left;  . BIOPSY  03/14/2019   Procedure: BIOPSY;  Surgeon: Irene Shipper, MD;  Location: [place];  Service: Endoscopy;;  . CATARACT EXTRACTION, BILATERAL Bilateral   . COLONOSCOPY WITH PROPOFOL N/A  03/14/2019   Procedure: COLONOSCOPY WITH PROPOFOL;  Surgeon: Irene Shipper, MD;  Location[facility] ENDOSCOPY;  Service: Endoscopy;  Laterality: N/A;  . ESOPHAGOGASTRODUODENOSCOPY (EGD) WITH PROPOFOL N/A 05/05/2014   Procedure: ESOPHAGOGASTRODUODENOSCOPY (EGD) WITH PROPOFOL;  Surgeon: Missy Sabins, MD;  Location: [facility] ENDOSCOPY;  Service: Endoscopy;  Laterality: N/A;  . EYE SURGERY Bilateral    cataract surgery   . GLAUCOMA SURGERY Bilateral   . INSERTION OF ARTERIOVENOUS (AV) ARTEGRAFT ARM  10/09/2018   Procedure: INSERTION OF ARTERIOVENOUS (AV) 41mm x 41cm ARTEGRAFT LEFT UPPER ARM;  Surgeon: Waynetta Sandy, MD;  Location: [place];  Service: Vascular;;  . INSERTION OF DIALYSIS CATHETER Right 07/14/2019   Procedure: Insertion Of Dialysis Catheter;  Surgeon: Elam Dutch, MD;  Location: [facility] OR;  Service: Vascular;  Laterality: Right;  placed right Internal Jugular  . INSERTION PROSTATE RADIATION SEED    . IR FLUORO GUIDE CV LINE RIGHT  02/18/2018  . IR REMOVAL TUN CV CATH W/O FL  12/06/2018  . IR THROMBECTOMY AV FISTULA W/THROMBOLYSIS/PTA INC/SHUNT/IMG RIGHT Right 01/26/2020  . IR THROMBECTOMY AV FISTULA W/THROMBOLYSIS/PTA INC/SHUNT/IMG RIGHT Right 02/24/2020  . IR THROMBECTOMY AV FISTULA W/THROMBOLYSIS/PTA INC/SHUNT/IMG RIGHT Right 04/07/2020  . IR US GUIDE VASC ACCESS RIGHT  02/18/2018  . IR US GUIDE VASC ACCESS RIGHT  01/26/2020  . IR US GUIDE VASC ACCESS RIGHT  04/07/2020  . LEFT HEART CATH AND CORONARY ANGIOGRAPHY N/A 02/20/2018   Procedure: LEFT HEART CATH AND CORONARY ANGIOGRAPHY;  Surgeon: Jettie Booze, MD;  Location: [place] CV LAB;;; 50% ostial OM2, 25% mLAD.  Marland Kitchen LEFT HEART CATH AND CORONARY ANGIOGRAPHY  2004   mildly depressed LV systolic fx EF 96%,EXBMWU coronaries/abdominal aorta/renal arteries.  . LOWER EXTREMITY ANGIOGRAM Left 11/17/2019   Procedure: Lower Extremity Fistulogram;  Surgeon: Marty Heck, MD;  Location: [place];  Service: Vascular;  Laterality: Left;  . NM  [place]  02/21/2010   No ischemia or infarction.  EF 27%.  Marland Kitchen RADIOACTIVE SEED IMPLANT  2010   prostate cancer  . REVISION OF ARTERIOVENOUS GORETEX GRAFT Right 07/11/2019   Procedure: REVISION OF ARTERIOVENOUS GORETEX GRAFT RIGHT ARM WITH ARTEGRAFT;  Surgeon: Serafina Mitchell, MD;  Location: [place];  Service: Vascular;  Laterality: Right;  . SHUNTOGRAM Right 07/14/2019   Procedure: Shuntogram;  Surgeon: Elam Dutch, MD;  Location: [place];  Service: Vascular;  Laterality: Right;  . THROMBECTOMY AND REVISION OF ARTERIOVENTOUS (AV) GORETEX  GRAFT Right 07/14/2019   Procedure: THROMBECTOMY AND REVISION OF ARTERIOVENTOUS (AV) GORETEX  GRAFT;  Surgeon: Elam Dutch, MD;  Location: [place];  Service: Vascular;  Laterality: Right;  . THROMBECTOMY W/ EMBOLECTOMY Left 02/26/2018   Procedure: THROMBECTOMY ARTERIOVENOUS GRAFT;  Surgeon: Angelia Mould, MD;  Location: [place];  Service: Vascular;  Laterality: Left;  . THROMBECTOMY W/ EMBOLECTOMY Left 11/17/2019   Procedure: THROMBECTOMY ARTERIOVENOUS GRAFT LEFT THIGH;  Surgeon: Marty Heck, MD;  Location: [place];  Service: Vascular;  Laterality: Left;  . TRANSTHORACIC ECHOCARDIOGRAM  02/2012   [facility]) EF 45-50% with mild global HK.  Mild LVH,LA mod. dilated,mild-mod. 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 AI.  Marland Kitchen TRANSTHORACIC ECHOCARDIOGRAM  9/'18; 3/'19   a) Severely reduced EF.  25 from 30%.  GR 1 DD with diffuse ecchymosis. Mild AS & AI.  Mild LA/RA dilation; b) (post PEA arest):   Moderately dilated LV with moderate concentric virtually.  EF 15 to 20%.  GR 1 DD.  Paradoxical septal motion. Mild AI.  Mild LA dilation.  Poorly visualized RV.  Marland Kitchen TRANSTHORACIC ECHOCARDIOGRAM  02/2019   EF 20-25%.  Unable to assess diastolic function (A. Fib).  No LV apical thrombus.  Mild AI.  Mildly reduced RV function, however poorly visualized.  Marland Kitchen ULTRASOUND GUIDANCE FOR VASCULAR ACCESS  02/20/2018   Procedure:  Ultrasound Guidance For Vascular Access;  Surgeon: Jettie Booze, MD;  Location: [place] CV LAB;  Service: Cardiovascular;;  . UPPER EXTREMITY VENOGRAPHY N/A 07/08/2018   Procedure: UPPER EXTREMITY VENOGRAPHY;  Surgeon: Waynetta Sandy, MD;  Location: [place] CV LAB;  Service: Cardiovascular;  Laterality: N/A;  Bilateral   Family History  Problem Relation Age of Onset  . Hypertension Mother   . Thyroid disease Mother   . Cholelithiasis Daughter   . Cholelithiasis Son   . Hypertension Maternal Grandmother   . Diabetes Maternal Grandmother   . Heart attack Neg Hx   . Hyperlipidemia Neg Hx    ROS: As per HPI otherwise negative.   Physical Exam: Vitals:   05/15/20 0200 05/15/20 0300 05/15/20 0326 05/15/20 0938  BP: 140/76 138/75 (!) 152/76 (!) 142/74  Pulse: 71 69 76 78  Resp: 17 17 18 18   Temp:  97.7 F (36.5 C) 97.7 F (36.5 C) 98 F (36.7 C)  TempSrc:  Oral Oral Oral  SpO2: 100% 97% 100% 100%  Weight:      Height:         General: Well developed, well nourished, in no acute distress. Head: Normocephalic, atraumatic, sclera non-icteric, mucus membranes are moist. Neck: Supple without lymphadenopathy/masses. JVD not elevated. Lungs: Clear bilaterally to auscultation without wheezes, rales, or rhonchi. Breathing is unlabored. Heart: RRR with normal S1, S2. No murmurs, rubs, or gallops appreciated. Abdomen: Soft, non-tender, non-distended with normoactive bowel sounds. No rebound/guarding. No obvious abdominal masses. Lower extremities: No edema or ischemic changes, no open wounds. Neuro: Alert and oriented X 3. Moves all extremities spontaneously. Psych:  Responds to questions appropriately with a normal affect. Dialysis Access: R chest TDC without surrounding erythema/drainage  Allergies  Allergen Reactions  . Dextromethorphan-Guaifenesin Other (See Comments)    Unknown reaction  . Tocotrienols Other (See Comments)    Unknown reaction  . Losartan  Potassium Other (See Comments)    Causes constipation   Prior to Admission medications   Medication Sig Start Date End Date Taking? Authorizing Provider  acetaminophen (TYLENOL) 500 MG tablet Take 1,000 mg by mouth every 6 (six) hours as needed for moderate pain.   Yes [provider]  allopurinol (ZYLOPRIM) 100 MG tablet Take 1 tablet (100 mg total) by mouth See admin instructions. Take one tablet (100 mg) by mouth Mondays, Tuesday, Thursday, Saturday after dialysis 03/25/20  Yes Croitoru, Mihai, MD  amiodarone (PACERONE) 200 MG tablet Take 1 tablet (200 mg total) by mouth daily. Patient taking differently: Take 200 mg by mouth daily at 12 noon.  05/12/19  Yes Croitoru, Dani Gobble, MD  bictegravir-emtricitabine-tenofovir  AF ([name]) 50-200-25 MG TABS tablet Take 1 tablet by mouth daily. Patient taking differently: Take 1 tablet by mouth daily at 12 noon.  09/29/19  Yes Tommy Medal, Lavell Islam, MD  brimonidine (ALPHAGAN) 0.15 % ophthalmic solution Place 1 drop into the right eye 2 (two) times daily.  11/15/17  Yes [provider]  latanoprost (XALATAN) 0.005 % ophthalmic solution Place 1 drop into the right eye at bedtime.    Yes [provider]  loratadine (CLARITIN) 10 MG tablet Take 10 mg by mouth daily at 12 noon.    Yes [provider]  midodrine (PROAMATINE) 10 MG tablet Take 1 tablet (10 mg total) by mouth as directed. Twice a day on the day of HD Tuesday, Thursday, Saturday (1 in AM and 1 at [place]). Take 1/2 tablet (5mg ) a day on non HD days Patient taking differently: Take 10 mg by mouth See admin instructions. Take 20 mg in the morning of dialysis days and 10 mg half way through treatment on dialysis days (Mon, Tues, [place], and Sat) 04/22/19  Yes Leonie Man, MD  montelukast (SINGULAIR) 10 MG tablet Take 1 tablet (10 mg total) by mouth at bedtime. NEEDS OV/FOLLOW UP 03/23/20  Yes [name], NP  Multiple Vitamin (MULTIVITAMIN WITH MINERALS) TABS  tablet Take 1 tablet by mouth every evening.    Yes [provider]  polyethylene glycol (MIRALAX / GLYCOLAX) 17 g packet Take 17 g by mouth every evening.   Yes [provider]  sucroferric oxyhydroxide (VELPHORO) 500 MG chewable tablet Chew 500 mg by mouth 3 (three) times daily with meals.   Yes [provider]  traMADol (ULTRAM) 50 MG tablet Take 1 tablet (50 mg total) by mouth every 6 (six) hours as needed. Patient taking differently: Take 50 mg by mouth every 6 (six) hours as needed for moderate pain.  01/27/20  Yes Domenic Polite, MD  warfarin (COUMADIN) 5 MG tablet Take 5mg  daily for next 3days and then check INR 2/25, goal INR 2-3 Patient taking differently: Take 5 mg by mouth See admin instructions. On Monday, Tuesday, Thursday and Saturday 05/05/20  Yes Leonie Man, MD   Current Facility-Administered Medications  Medication Dose Route Frequency Provider Last Rate Last Admin  . allopurinol (ZYLOPRIM) tablet 100 mg  100 mg Oral Once per day on Mon Tue Thu Sat Etta Quill, DO      . amiodarone (PACERONE) tablet 200 mg  200 mg Oral Q1200 Etta Quill, DO      . bictegravir-emtricitabine-tenofovir AF ([name]) 50-200-25 MG per tablet 1 tablet  1 tablet Oral Q1200 Etta Quill, DO      . brimonidine (ALPHAGAN) 0.15 % ophthalmic solution 1 drop  1 drop Right Eye BID Jennette Kettle M, DO   1 drop at 05/15/20 0850  . Chlorhexidine Gluconate Cloth 2 % PADS 6 each  6 each Topical Daily Domenic Polite, MD   6 each at 05/15/20 0850  . latanoprost (XALATAN) 0.005 % ophthalmic solution 1 drop  1 drop Right Eye QHS Jennette Kettle M, DO      . loratadine (CLARITIN) tablet 10 mg  10 mg Oral Q1200 Etta Quill, DO      . midodrine (PROAMATINE) tablet 10 mg  10 mg Oral Once per day on Mon Tue Thu Sat Etta Quill, DO      . midodrine (PROAMATINE) tablet 20 mg  20 mg Oral Once per day on Mon Tue Thu Sat  Etta Quill, DO   20 mg at 05/15/20 0848  .  montelukast (SINGULAIR) tablet 10 mg  10 mg Oral QHS Jennette Kettle M, DO      . multivitamin (RENA-VIT) tablet 1 tablet  1 tablet Oral QHS Domenic Polite, MD      . ondansetron [facility]) tablet 4 mg  4 mg Oral Q6H PRN Etta Quill, DO       Or  . ondansetron [facility]) injection 4 mg  4 mg Intravenous Q6H PRN Etta Quill, DO      . polyethylene glycol (MIRALAX / GLYCOLAX) packet 17 g  17 g Oral QPM Etta Quill, DO      . sucroferric oxyhydroxide [facility]) chewable tablet 500 mg  500 mg Oral TID WC Jennette Kettle M, DO   500 mg at 05/15/20 0849  . traMADol (ULTRAM) tablet 50 mg  50 mg Oral Q6H PRN Etta Quill, DO       Labs: Basic Metabolic Panel: Recent Labs  Lab 05/14/20 1651 05/15/20 0452  NA 138 138  K 5.2* 5.3*  CL 98 98  CO2 26 26  GLUCOSE 119* 88  BUN 31* 39*  CREATININE 8.38* 9.62*  CALCIUM 9.8 9.4   Liver Function Tests: Recent Labs  Lab 05/14/20 1651 05/15/20 0452  AST 501* 291*  ALT 489* 391*  ALKPHOS 311* 341*  BILITOT 3.1* 3.4*  PROT 7.4 6.7  ALBUMIN 3.9 3.6   Recent Labs  Lab 05/14/20 1651  LIPASE 37   CBC: Recent Labs  Lab 05/14/20 1651 05/15/20 0452  WBC 7.2 5.0  HGB 10.5* 9.6*  HCT 32.9* 29.7*  MCV 110.4* 108.4*  PLT 209 183   Studies/Results: US Abdomen Limited RUQ  Result Date: 05/15/2020 CLINICAL DATA:  Right upper quadrant abdominal pain EXAM: ULTRASOUND ABDOMEN LIMITED RIGHT UPPER QUADRANT COMPARISON:  May 10, 2014 FINDINGS: Gallbladder: There is diffuse gallbladder wall thickening, measuring up to approximately 1.5 cm. There is gallbladder sludge. There are no distinct gallstones. The sonographic Percell Miller sign is negative. Common bile duct: Diameter: 5 mm Liver: No focal lesion identified. Within normal limits in parenchymal echogenicity. Portal vein is patent on color Doppler imaging with normal direction of blood flow towards the liver. Other: The right kidney is echogenic without evidence for hydronephrosis. IMPRESSION:  1. There is gallbladder wall thickening which is nonspecific. This can be seen in patients with heart failure among other causes. In the absence of a positive sonographic Murphy sign, findings are less likely to represent acute cholecystitis. 2. Echogenic right kidney consistent with medical renal disease. Electronically Signed   By: Constance Holster M.D.   On: 05/15/2020 01:15    Dialysis Orders: Center: [name]  on [address]. 4 hours, 180NRe, BFR 600, DFR 600, EDW 84kg, 3K, 2Ca, UF profile 4, TDC Heparin 2000 unit bolus Mircera 10mcg IVP q HD- last dose 5/1, Hgb <11 Calcitriol 0.20mcg PO q HD  Assessment/Plan: 1.  Gallbladder sludge, elevated LFTS: seen by GI, suspected to have passed a small stone. Next step MRCP and possibly lap chole eventually. Currently feeling well with no abdominal pain. 2.  ESRD:  On MTTS schedule for volume management. He is currently euvolemic and at his EDW but has multiple past admissions for flash pulmonary edema. K+ 5.3. No emergent indication for HD at this time but will try to schedule HD for today/tonight as schedule allows. TDC dependent at this time- AVG recently clotted and has been referred back to  [facility] as outpatient but has limited access options at this point.  3.  Hypertension: BP slightly elevated. Takes midodrine pre-HD outpatient. UFG 1-2L as tolerated.  4.  Anemia: Hgb 9.6, last ESA dose held for Hgb >11. Follow, if Hgb stays below 10 will restart ESA 5.  Metabolic bone disease: Calcium borderline high, hold calcitriol for now.  6.  Nutrition:  Currently NPO  Anice Paganini, PA-C 05/15/2020, 11:14 AM  [facility] Pager: [phone]  Nephrology attending Patient was seen and examined.  Chart reviewed.  I agree with the consult note including assessment and plan as outlined above. 78 year old male with history of hypertension, CHF, ESRD on HD 4 times a week admitted with transaminitis, abdomen pain and  gallbladder sludge.  Seen by general surgery with plan for MRCP and then may need surgical intervention.  Clinically improving.  Mild hyperkalemia noted.  Plan for HD today.  He is now catheter dependent.  Katheran James, MD [facility].

## 2020-05-15 NOTE — Progress Notes (Addendum)
Pt seen and examined, admitted earlier this am by Dr.Gardner  HPI: [facility] is a 78/M with history of ESRD on hemodialysis MTTS, CAD, chronic systolic CHF, EF of 50%, paroxysmal atrial fibrillation on Coumadin, HIV, presented to the emergency room with abdominal pain, vomiting, on further evaluation noted to have LFTs in the 500s and T bili of 3.1, RUQ [place]: gall bladder wall thickening and sludge, but no murphy's sign  Abd pain/vomiting Abnormal LFTS -I suspect passed stone -symptoms have resolved, exam benign -RUQ [place] with GB wall thickening, does not indicated dilated ducts, no stones noted, + GB sludge -Afebrile, LFTs improving, discussed with Dr. Donne Hazel, greatly appreciate [facility] input -will obtain MRCP  P.Afib -rate controlled, hold coumadin pending further workup -In sinus rhythm, continue amiodarone  Chronic systolic and diastolic CHF Coronary artery disease -EF of 20%, volume managed with dialysis -Not on cardiac meds due to chronic hypotension -Last cath 02/2018 with nonobstructive CAD -Continue midodrine  ESRD on hemodialysis -On dialysis Monday Tuesday Thursday Saturday -Nephrology consulted, due for HD today -Potassium up today, should improve with HD  Macrocytic anemia: Chronic. -Hemoglobin at baseline, monitor  History of HIV: CD4 T-cell absolute count 406 on 01/21/20. -ContinueBiktarvy  Domenic Polite, MD

## 2020-05-15 NOTE — Progress Notes (Signed)
ANTICOAGULATION CONSULT NOTE - Initial Consult  Pharmacy Consult for Coumadin Indication: atrial fibrillation  Allergies  Allergen Reactions  . Dextromethorphan-Guaifenesin Other (See Comments)    Unknown reaction  . Tocotrienols Other (See Comments)    Unknown reaction  . Losartan Potassium Other (See Comments)    Causes constipation    Patient Measurements: Height: 6\' 2"  (188 cm) Weight: 83.9 kg (185 lb) IBW/kg (Calculated) : 82.2  Vital Signs: Temp: 97.9 F (36.6 C) (06/11 2056) Temp Source: Oral (06/11 1647) BP: 140/76 (06/12 0200) Pulse Rate: 71 (06/12 0200)  Labs: Recent Labs    05/14/20 1651  HGB 10.5*  HCT 32.9*  PLT 209  CREATININE 8.38*    Estimated Creatinine Clearance: 8.6 mL/min (A) (by C-G formula based on SCr of 8.38 mg/dL (H)).   Medical History: Past Medical History:  Diagnosis Date  . 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 Acute on chronic systolic and diastolic heart failure, NYHA class 4 ([place])   . Anemia, iron deficiency 11/15/2011  . Arthritis    "hands, right knee, feet" (02/21/2018)  . Atrial fibrillation ([place])   . Cardiac arrest ([place]) 02/17/2018  . CHF (congestive heart failure) ([place])   . Chronic combined systolic and diastolic heart failure, NYHA class 3 ([place]) 06/2010   felt to be secondary to hypertensive cardiomyopathy  . Chronic lower back pain   . CKD (chronic kidney disease) stage V requiring chronic dialysis ([place])   . COVID-19 12/2019  . ESRD on dialysis [facility])    started 02/2018 [place]  . GERD (gastroesophageal reflux disease)   . Glaucoma    "I had surgery and dont have it any more"  . Gout    daily RX (02/21/2018)  . Heart murmur    "mild" per pt  . Hepatitis    years ago  . HIV (human immunodeficiency virus infection) ([place])   . HIV infection ([place])   . Hyperlipidemia   . Hypertension   . Nonischemic cardiomyopathy ([place])   . Pneumonia 11/2017  . Prostate cancer ([place])    hx of prostate;  s/p radioactive seed implant 10/2009 Dr Janice Norrie  . Sinus bradycardia   . Sleep apnea    does not use a cpap  . Stroke [facility])    "mini stroke" years ago  . Wears glasses     Assessment: 79yo male c/o abdominal pain and vomiting, admitted for transaminitis, to continue Coumadin for Afib; last dose taken 6/10.  Goal of Therapy:  INR 2-3   Plan:  Will check PT/INR with am labs and continue home Coumadin regimen of 5mg  daily except 2.5mg  MonFri.  Wynona Neat, PharmD, BCPS  05/15/2020,2:41 AM

## 2020-05-15 NOTE — ED Notes (Signed)
Attempted IV x2 without success  

## 2020-05-16 DIAGNOSIS — Z8616 Personal history of COVID-19: Secondary | ICD-10-CM | POA: Diagnosis not present

## 2020-05-16 DIAGNOSIS — R7989 Other specified abnormal findings of blood chemistry: Secondary | ICD-10-CM | POA: Diagnosis not present

## 2020-05-16 DIAGNOSIS — N186 End stage renal disease: Secondary | ICD-10-CM | POA: Diagnosis present

## 2020-05-16 DIAGNOSIS — Z01810 Encounter for preprocedural cardiovascular examination: Secondary | ICD-10-CM | POA: Diagnosis not present

## 2020-05-16 DIAGNOSIS — I480 Paroxysmal atrial fibrillation: Secondary | ICD-10-CM | POA: Diagnosis present

## 2020-05-16 DIAGNOSIS — Z992 Dependence on renal dialysis: Secondary | ICD-10-CM | POA: Diagnosis not present

## 2020-05-16 DIAGNOSIS — I519 Heart disease, unspecified: Secondary | ICD-10-CM

## 2020-05-16 DIAGNOSIS — I482 Chronic atrial fibrillation, unspecified: Secondary | ICD-10-CM | POA: Diagnosis present

## 2020-05-16 DIAGNOSIS — B20 Human immunodeficiency virus [HIV] disease: Secondary | ICD-10-CM | POA: Diagnosis present

## 2020-05-16 DIAGNOSIS — I2510 Atherosclerotic heart disease of native coronary artery without angina pectoris: Secondary | ICD-10-CM | POA: Diagnosis present

## 2020-05-16 DIAGNOSIS — R1013 Epigastric pain: Secondary | ICD-10-CM | POA: Diagnosis not present

## 2020-05-16 DIAGNOSIS — D7589 Other specified diseases of blood and blood-forming organs: Secondary | ICD-10-CM | POA: Diagnosis present

## 2020-05-16 DIAGNOSIS — E8889 Other specified metabolic disorders: Secondary | ICD-10-CM | POA: Diagnosis present

## 2020-05-16 DIAGNOSIS — Z7901 Long term (current) use of anticoagulants: Secondary | ICD-10-CM | POA: Diagnosis not present

## 2020-05-16 DIAGNOSIS — I132 Hypertensive heart and chronic kidney disease with heart failure and with stage 5 chronic kidney disease, or end stage renal disease: Secondary | ICD-10-CM | POA: Diagnosis present

## 2020-05-16 DIAGNOSIS — K828 Other specified diseases of gallbladder: Secondary | ICD-10-CM | POA: Diagnosis present

## 2020-05-16 DIAGNOSIS — K219 Gastro-esophageal reflux disease without esophagitis: Secondary | ICD-10-CM | POA: Diagnosis present

## 2020-05-16 DIAGNOSIS — E875 Hyperkalemia: Secondary | ICD-10-CM | POA: Diagnosis present

## 2020-05-16 DIAGNOSIS — N2581 Secondary hyperparathyroidism of renal origin: Secondary | ICD-10-CM | POA: Diagnosis present

## 2020-05-16 DIAGNOSIS — Z8546 Personal history of malignant neoplasm of prostate: Secondary | ICD-10-CM | POA: Diagnosis not present

## 2020-05-16 DIAGNOSIS — Z87891 Personal history of nicotine dependence: Secondary | ICD-10-CM | POA: Diagnosis not present

## 2020-05-16 DIAGNOSIS — I43 Cardiomyopathy in diseases classified elsewhere: Secondary | ICD-10-CM | POA: Diagnosis present

## 2020-05-16 DIAGNOSIS — K8062 Calculus of gallbladder and bile duct with acute cholecystitis without obstruction: Secondary | ICD-10-CM | POA: Diagnosis present

## 2020-05-16 DIAGNOSIS — D638 Anemia in other chronic diseases classified elsewhere: Secondary | ICD-10-CM | POA: Diagnosis present

## 2020-05-16 DIAGNOSIS — I5042 Chronic combined systolic (congestive) and diastolic (congestive) heart failure: Secondary | ICD-10-CM | POA: Diagnosis present

## 2020-05-16 DIAGNOSIS — R7401 Elevation of levels of liver transaminase levels: Secondary | ICD-10-CM | POA: Diagnosis present

## 2020-05-16 DIAGNOSIS — D539 Nutritional anemia, unspecified: Secondary | ICD-10-CM | POA: Diagnosis present

## 2020-05-16 DIAGNOSIS — Z8673 Personal history of transient ischemic attack (TIA), and cerebral infarction without residual deficits: Secondary | ICD-10-CM | POA: Diagnosis not present

## 2020-05-16 DIAGNOSIS — E785 Hyperlipidemia, unspecified: Secondary | ICD-10-CM | POA: Diagnosis present

## 2020-05-16 LAB — COMPREHENSIVE METABOLIC PANEL
ALT: 279 U/L — ABNORMAL HIGH (ref 0–44)
AST: 144 U/L — ABNORMAL HIGH (ref 15–41)
Albumin: 3.2 g/dL — ABNORMAL LOW (ref 3.5–5.0)
Alkaline Phosphatase: 341 U/L — ABNORMAL HIGH (ref 38–126)
Anion gap: 12 (ref 5–15)
BUN: 23 mg/dL (ref 8–23)
CO2: 26 mmol/L (ref 22–32)
Calcium: 8 mg/dL — ABNORMAL LOW (ref 8.9–10.3)
Chloride: 99 mmol/L (ref 98–111)
Creatinine, Ser: 5.96 mg/dL — ABNORMAL HIGH (ref 0.61–1.24)
GFR calc Af Amer: 10 mL/min — ABNORMAL LOW (ref >60)
GFR calc non Af Amer: 8 mL/min — ABNORMAL LOW (ref >60)
Glucose, Bld: 77 mg/dL (ref 70–99)
Potassium: 3.8 mmol/L (ref 3.5–5.1)
Sodium: 137 mmol/L (ref 135–145)
Total Bilirubin: 1.5 mg/dL — ABNORMAL HIGH (ref 0.3–1.2)
Total Protein: 6.4 g/dL — ABNORMAL LOW (ref 6.5–8.1)

## 2020-05-16 LAB — SURGICAL PCR SCREEN
MRSA, PCR: NEGATIVE
Staphylococcus aureus: NEGATIVE

## 2020-05-16 LAB — CBC
HCT: 28.3 % — ABNORMAL LOW (ref 39.0–52.0)
Hemoglobin: 9.4 g/dL — ABNORMAL LOW (ref 13.0–17.0)
MCH: 35.6 pg — ABNORMAL HIGH (ref 26.0–34.0)
MCHC: 33.2 g/dL (ref 30.0–36.0)
MCV: 107.2 fL — ABNORMAL HIGH (ref 80.0–100.0)
Platelets: 153 10*3/uL (ref 150–400)
RBC: 2.64 MIL/uL — ABNORMAL LOW (ref 4.22–5.81)
RDW: 16.8 % — ABNORMAL HIGH (ref 11.5–15.5)
WBC: 4.5 10*3/uL (ref 4.0–10.5)
nRBC: 0 % (ref 0.0–0.2)

## 2020-05-16 MED ORDER — DARBEPOETIN ALFA 40 MCG/0.4ML IJ SOSY
40.0000 ug | PREFILLED_SYRINGE | INTRAMUSCULAR | Status: DC
  Filled 2020-05-16: qty 0.4

## 2020-05-16 MED ORDER — CALCITRIOL 0.5 MCG PO CAPS
0.5000 ug | ORAL_CAPSULE | ORAL | Status: DC
  Filled 2020-05-16: qty 1

## 2020-05-16 MED ORDER — CHLORHEXIDINE GLUCONATE CLOTH 2 % EX PADS: 6.0000 | MEDICATED_PAD | Freq: Every day | CUTANEOUS | Status: DC

## 2020-05-16 MED ORDER — HEPARIN SODIUM (PORCINE) 1000 UNIT/ML IJ SOLN
INTRAMUSCULAR | Status: AC
  Filled 2020-05-16: qty 4

## 2020-05-16 MED ORDER — MIDODRINE HCL 5 MG PO TABS
5.0000 mg | ORAL_TABLET | ORAL | Status: DC
  Administered 2020-05-16: 5 mg via ORAL
  Filled 2020-05-16: qty 1

## 2020-05-16 NOTE — Progress Notes (Signed)
°   05/16/20 0645  Hand-Off documentation  Handoff Given Given to shift RN/LPN  Report given to (Full Name) Candise Bowens, RN  Handoff Received Received from shift RN/LPN  Report received from (Full Name) Braydyn Schultes, RN  Vital Signs  Temp 97.7 F (36.5 C)  Temp Source Oral  Pulse Rate Source Monitor  Resp 18  BP (!) 95/49  BP Location Left Arm  BP Method Automatic  Patient Position (if appropriate) Lying  Oxygen Therapy  SpO2 98 %  O2 Device Room Air  Pain Assessment  Pain Scale 0-10  Pain Score 0  Dialysis Weight  Weight 83.2 kg  Type of Weight Post-Dialysis  Post-Hemodialysis Assessment  Rinseback Volume (mL) 250 mL  KECN 202 V  Dialyzer Clearance Lightly streaked  Duration of HD Treatment -hour(s) 3 hour(s)  Hemodialysis Intake (mL) 500 mL  UF Total -Machine (mL) 1392 mL  Net UF (mL) 892 mL  Tolerated HD Treatment Yes  Post-Hemodialysis Comments tx achieved as expected,with some episodes of low BP  AVG/AVF Arterial Site Held (minutes) 0 minutes  AVG/AVF Venous Site Held (minutes) 0 minutes  Education / Care Plan  Dialysis Education Provided Yes  Note  Observations pt is stable

## 2020-05-16 NOTE — Progress Notes (Addendum)
[facility] Progress Note   Subjective:   Patient seen in room. Had HD overnight with net UF 878mL. BP soft after treatment. Patient reports feeling well, no abdominal pain, N/V/D, SOB, orthopnea, CP, palpitations or dizziness. He is hoping to go home soon.   Objective Vitals:   05/16/20 0530 05/16/20 0600 05/16/20 0630 05/16/20 0645  BP: (!) 88/51 (!) 81/62 (!) 82/61 (!) 95/49  Pulse: 72 69 70   Resp:    18  Temp:    97.7 F (36.5 C)  TempSrc:    Oral  SpO2:    98%  Weight:    83.2 kg  Height:       Physical Exam General: Well developed male, alert and in NAD Heart: RRR, no murmurs, rubs or gallops Lungs: CTA bilaterally without wheezing, rhonchi or rales Abdomen: Soft, non-tender, non-distended. +BS Extremities: No edema b/l lower extremities Dialysis Access:  R [facility]  Additional Objective Labs: Basic Metabolic Panel: Recent Labs  Lab 05/14/20 1651 05/15/20 0452 05/16/20 0724  NA 138 138 137  K 5.2* 5.3* 3.8  CL 98 98 99  CO2 26 26 26   GLUCOSE 119* 88 77  BUN 31* 39* 23  CREATININE 8.38* 9.62* 5.96*  CALCIUM 9.8 9.4 8.0*   Liver Function Tests: Recent Labs  Lab 05/14/20 1651 05/15/20 0452 05/16/20 0724  AST 501* 291* 144*  ALT 489* 391* 279*  ALKPHOS 311* 341* 341*  BILITOT 3.1* 3.4* 1.5*  PROT 7.4 6.7 6.4*  ALBUMIN 3.9 3.6 3.2*   Recent Labs  Lab 05/14/20 1651  LIPASE 37   CBC: Recent Labs  Lab 05/14/20 1651 05/15/20 0452 05/16/20 0724  WBC 7.2 5.0 4.5  HGB 10.5* 9.6* 9.4*  HCT 32.9* 29.7* 28.3*  MCV 110.4* 108.4* 107.2*  PLT 209 183 153   Blood Culture    Component Value Date/Time   SDES BLOOD RIGHT HAND 12/11/2019 2100   SPECREQUEST  12/11/2019 2100    BOTTLES DRAWN AEROBIC ONLY Blood Culture results may not be optimal due to an inadequate volume of blood received in culture bottles   CULT  12/11/2019 2100    NO GROWTH 5 DAYS Performed at [facility] [address], [place], [place] [address]     REPTSTATUS 12/16/2019 FINAL 12/11/2019 2100    Cardiac Enzymes: No results for input(s): CKTOTAL, CKMB, CKMBINDEX, TROPONINI in the last 168 hours. CBG: No results for input(s): GLUCAP in the last 168 hours. Iron Studies: No results for input(s): IRON, TIBC, TRANSFERRIN, FERRITIN in the last 72 hours. @lablastinr3 @ Studies/Results: MR ABDOMEN MRCP W WO CONTAST  Result Date: 05/15/2020 CLINICAL DATA:  Abdominal pain, unspecified abdominal pain EXAM: MRI ABDOMEN WITHOUT AND WITH CONTRAST (INCLUDING MRCP) TECHNIQUE: Multiplanar multisequence MR imaging of the abdomen was performed both before and after the administration of intravenous contrast. Heavily T2-weighted images of the biliary and pancreatic ducts were obtained, and three-dimensional MRCP images were rendered by post processing. CONTRAST:  57mL GADAVIST GADOBUTROL 1 MMOL/ML IV SOLN COMPARISON:  Abdominal sonogram of May 15, 2020 FINDINGS: Lower chest: Marked cardiomegaly without pericardial effusion. No pleural effusion. Limited assessment of the lung bases. Hepatobiliary: Marked hepatic iron deposition without focal, suspicious hepatic lesion. Small amount of pericholecystic fluid. Minimal wall thickening with some uniform enhancement that is not significantly changed compared to a CT from 01/25/2020. No signs of cholelithiasis, sludge layering in the gallbladder. No choledocholithiasis. No biliary ductal dilation. Pancreas:  Pancreas is normal. Spleen: Spleen normal size and contour  with splenic iron deposition as well. Adrenals/Urinary Tract: Adrenal glands with profound loss of signal on in phase as compared to out of phase T1 weighted gradient echo imaging. Bilateral cysts in the kidneys with simple and hemorrhagic features. No concerning renal lesion. Signs of cortical atrophy of the bilateral kidneys symmetric and bilateral. Stomach/Bowel: No acute gastrointestinal process to the extent visualized. Study not protocol for bowel evaluation.  Vascular/Lymphatic: Vascular structures in the abdomen are patent. No adenopathy. Other:  No ascites.  Small pericholecystic fluid. Musculoskeletal: Profound iron deposition in marrow spaces. IMPRESSION: 1. Gallbladder wall thickening and pericholecystic fluid similar to the recent ultrasound evaluation. Gallbladder wall enhancement is similar accounting for differences in technique when compared to the CT study from February of 2021. Findings remain equivocal for acute cholecystitis, question remains given small amount of pericholecystic fluid detected on current exam. Correlation with HIDA scan may be helpful. 2. No filling defect in the common bile duct. Sludge in the gallbladder. It is possible that the constellation of findings could be related to recently passed calculus or hepatic dysfunction. 3. Signs of iron overload. This could be seen in the setting of treatment of anemia with Feraheme. There are no signs of recent Feraheme administration I 80 increased signal in the vasculature on precontrast imaging. 4. Iron deposition in the adrenal glands can also be seen with this medication/treatment. Correlation with adrenal function may be helpful. 5. Bilateral renal cysts. Electronically Signed   By: Zetta Bills M.D.   On: 05/15/2020 19:25   US Abdomen Limited RUQ  Result Date: 05/15/2020 CLINICAL DATA:  Right upper quadrant abdominal pain EXAM: ULTRASOUND ABDOMEN LIMITED RIGHT UPPER QUADRANT COMPARISON:  May 10, 2014 FINDINGS: Gallbladder: There is diffuse gallbladder wall thickening, measuring up to approximately 1.5 cm. There is gallbladder sludge. There are no distinct gallstones. The sonographic Percell Miller sign is negative. Common bile duct: Diameter: 5 mm Liver: No focal lesion identified. Within normal limits in parenchymal echogenicity. Portal vein is patent on color Doppler imaging with normal direction of blood flow towards the liver. Other: The right kidney is echogenic without evidence for  hydronephrosis. IMPRESSION: 1. There is gallbladder wall thickening which is nonspecific. This can be seen in patients with heart failure among other causes. In the absence of a positive sonographic Murphy sign, findings are less likely to represent acute cholecystitis. 2. Echogenic right kidney consistent with medical renal disease. Electronically Signed   By: Constance Holster M.D.   On: 05/15/2020 01:15   Medications:   allopurinol  100 mg Oral Once per day on Mon Tue Thu Sat   amiodarone  200 mg Oral Q1200   bictegravir-emtricitabine-tenofovir AF  1 tablet Oral Q1200   brimonidine  1 drop Right Eye BID   Chlorhexidine Gluconate Cloth  6 each Topical Q0600   latanoprost  1 drop Right Eye QHS   loratadine  10 mg Oral Q1200   midodrine  10 mg Oral Once per day on Mon Tue Thu Sat   midodrine  20 mg Oral Once per day on Mon Tue Thu Sat   montelukast  10 mg Oral QHS   multivitamin  1 tablet Oral QHS   polyethylene glycol  17 g Oral QPM   sucroferric oxyhydroxide  500 mg Oral TID WC    Dialysis Orders: Center: [facility]  on [address]. 4 hours, 180NRe, BFR 600, DFR 600, EDW 84kg, 3K, 2Ca, UF profile 4, TDC Heparin 2000 unit bolus Mircera 27mcg IVP q HD- last  dose 5/1, Hgb <11 Calcitriol 0.77mcg PO q HD  Assessment/Plan: 1.  Gallbladder sludge, elevated LFTS: seen by GI, suspected to have passed a small stone. Next step MRCP and possibly lap chole eventually. Currently feeling well with no abdominal pain. 2.  ESRD:  On MTTS schedule for volume management. K+ 3.8. Next HD 6/14- can go to outpatient unit if discharged today. TDC dependent at this time- AVG recently clotted and has been referred back to [facility] as outpatient but has limited access options at this point.  3.  Hypertension: BP soft post-HD. Takes midodrine pre-HD outpatient, will continue here. UFG 1-2L as tolerated.  4.  Anemia: Hgb 9.4, last ESA dose held for Hgb >11, will restart ESA with HD tomorrow.   5.  Metabolic bone disease: Calcium borderline high yesterday, now low. Resume calcitriol.  6.  Nutrition:  Currently NPO  Anice Paganini, PA-C 05/16/2020, 8:52 AM  [facility] Pager: [phone]  Nephrology attending: Patient was seen and examined at bedside.  Chart reviewed.  I agree with assessment and plan as outlined above. The liver enzymes are trending down.  He had dialysis early this morning and tolerated well.  He has chronic hypotension therefore takes midodrine.  Plan for next HD tomorrow. Noted general surgery is waiting for cardiologist's evaluation before proceeding gallbladder surgery.  Katheran James, MD [facility]

## 2020-05-16 NOTE — Progress Notes (Signed)
Surgery Follow Up Note  Subjective:    Overnight Issues:   Objective:  Vital signs for last 24 hours: Temp:  [97.7 F (36.5 C)-98.7 F (37.1 C)] 98 F (36.7 C) (06/13 0855) Pulse Rate:  [64-80] 76 (06/13 0855) Resp:  [16-18] 18 (06/13 0855) BP: (81-122)/(45-69) 98/52 (06/13 0855) SpO2:  [98 %-100 %] 98 % (06/13 0855) Weight:  [83.2 kg-84 kg] 83.2 kg (06/13 0645)  Hemodynamic parameters for last 24 hours:    Intake/Output from previous day: 06/12 0701 - 06/13 0700 In: 240 [P.O.:240] Out: 1142 [Urine:250]  Intake/Output this shift: Total I/O In: 240 [P.O.:240] Out: 0   Vent settings for last 24 hours:    Physical Exam:  Gen: comfortable, no distress Neuro: non-focal exam HEENT: PERRL Neck: supple CV: RRR Pulm: unlabored breathing Abd: soft, NT GU: R Moore vascath, HD TTS Extr: wwp, no edema   Results for orders placed or performed during the hospital encounter of 05/14/20 (from the past 24 hour(s))  CBC     Status: Abnormal   Collection Time: 05/16/20  7:24 AM  Result Value Ref Range   WBC 4.5 4.0 - 10.5 K/uL   RBC 2.64 (L) 4.22 - 5.81 MIL/uL   Hemoglobin 9.4 (L) 13.0 - 17.0 g/dL   HCT 28.3 (L) 39 - 52 %   MCV 107.2 (H) 80.0 - 100.0 fL   MCH 35.6 (H) 26.0 - 34.0 pg   MCHC 33.2 30.0 - 36.0 g/dL   RDW 16.8 (H) 11.5 - 15.5 %   Platelets 153 150 - 400 K/uL   nRBC 0.0 0.0 - 0.2 %  Comprehensive metabolic panel     Status: Abnormal   Collection Time: 05/16/20  7:24 AM  Result Value Ref Range   Sodium 137 135 - 145 mmol/L   Potassium 3.8 3.5 - 5.1 mmol/L   Chloride 99 98 - 111 mmol/L   CO2 26 22 - 32 mmol/L   Glucose, Bld 77 70 - 99 mg/dL   BUN 23 8 - 23 mg/dL   Creatinine, Ser 5.96 (H) 0.61 - 1.24 mg/dL   Calcium 8.0 (L) 8.9 - 10.3 mg/dL   Total Protein 6.4 (L) 6.5 - 8.1 g/dL   Albumin 3.2 (L) 3.5 - 5.0 g/dL   AST 144 (H) 15 - 41 U/L   ALT 279 (H) 0 - 44 U/L   Alkaline Phosphatase 341 (H) 38 - 126 U/L   Total Bilirubin 1.5 (H) 0.3 - 1.2 mg/dL   GFR  calc non Af Amer 8 (L) >60 mL/min   GFR calc Af Amer 10 (L) >60 mL/min   Anion gap 12 5 - 15    Assessment & Plan:  Present on Admission: . HIV (human immunodeficiency virus infection) ([place]) . Atrial fibrillation, chronic ([place]) . Hemodialysis-associated hypotension . Transaminitis    LOS: 0 days   Additional comments:I reviewed the patient's new clinical lab test results.   and I reviewed the patients new imaging test results.    Gallbladder sludge Elevated LFTs - LFTs downtrending, MRCP negative for filling defect, but sludge present. Likely passed a small stone - Still awaiting cardiology evaluation to determine if any additional optimization can be performed. If no further optimization possible, lap chole next week. If optimization possible, cholecystostomy tube and elective lap chole after optimization.    Jesusita Oka, MD Trauma & General Surgery Please use [email or web address] to contact on call provider  05/16/2020  *Care during the described time interval was provided by  me. I have reviewed this patient's available data, including medical history, events of note, physical examination and test results as part of my evaluation.

## 2020-05-16 NOTE — Consult Note (Signed)
Cardiology Consultation:   Patient ID: David [name] MRN: [id number]; DOB: 1941/06/13  Admit date: 05/14/2020 Date of Consult: 05/16/2020  Primary Care Provider: [name], NP [facility] Cardiologist: [name], MD      Patient Profile:   David [name] is a 79 y.o. male with a hx of ESRD on HD, combined systolic and diastolic CHF, atrial fibrillation, nonischemic CM, and prior PEA arrest who is being seen today for the evaluation of preoperative clearance at the request of Dr Broadus John.  History of Present Illness:   David [name] is chronically ill and has multiple comoribidities.  He is followed by Dr Sallyanne Kuster who notes that his prognosis is poor.  He has ESRD and is on HD.  Low BPs with dialysis have limited medical therapy for his nonischemic CM.  He has afib for which he is on amiodarone and coumadin.   His INRs are chronically low.  He is HIV positive and is followed by Dr Tommy Medal.  He is now admitted with symptoms of acute cholecystitis.  Cardiology is asked to assess preoperatively.   Past Medical History:  Diagnosis Date  . 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 Acute on chronic systolic and diastolic heart failure, NYHA class 4 ([place])   . Anemia, iron deficiency 11/15/2011  . Arthritis    "hands, right knee, feet" (02/21/2018)  . Atrial fibrillation ([place])   . Cardiac arrest ([place]) 02/17/2018  . CHF (congestive heart failure) ([place])   . Chronic combined systolic and diastolic heart failure, NYHA class 3 ([place]) 06/2010   felt to be secondary to hypertensive cardiomyopathy  . Chronic lower back pain   . CKD (chronic kidney disease) stage V requiring chronic dialysis ([place])   . COVID-19 12/2019  . ESRD on dialysis [facility])    started 02/2018 [place]  . GERD (gastroesophageal reflux disease)   . Glaucoma    "I had surgery and dont have it any more"  . Gout    daily RX (02/21/2018)  . Heart murmur    "mild" per pt  .  Hepatitis    years ago  . HIV (human immunodeficiency virus infection) ([place])   . HIV infection ([place])   . Hyperlipidemia   . Hypertension   . Nonischemic cardiomyopathy ([place])   . Pneumonia 11/2017  . Prostate cancer ([place])    hx of prostate; s/p radioactive seed implant 10/2009 Dr Janice Norrie  . Sinus bradycardia   . Sleep apnea    does not use a cpap  . Stroke [facility])    "mini stroke" years ago  . Wears glasses     Past Surgical History:  Procedure Laterality Date  . AV FISTULA PLACEMENT Right 10/13/2016   Procedure: ARTERIOVENOUS (AV) FISTULA CREATION;  Surgeon: [name], MD;  Location: [place];  Service: Vascular;  Laterality: Right;  . AV FISTULA PLACEMENT Left 02/25/2018   Procedure: INSERTION OF ARTERIOVENOUS (AV) GORE-TEX GRAFT LEFT UPPER ARM;  Surgeon: Angelia Mould, MD;  Location: [place];  Service: Vascular;  Laterality: Left;  . AV FISTULA PLACEMENT Right 08/07/2018   Procedure: Creation of right arm brachiocephalic Fistula;  Surgeon: Waynetta Sandy, MD;  Location: [place];  Service: Vascular;  Laterality: Right;  . AV FISTULA PLACEMENT Left 11/10/2019   Procedure: INSERTION OF ARTERIOVENOUS (AV) GORE-TEX GRAFT THIGH;  Surgeon: Elam Dutch, MD;  Location: [place];  Service: Vascular;  Laterality: Left;  . AV FISTULA PLACEMENT Right 12/02/2019   Procedure: INSERTION  OF ARTERIOVENOUS (AV) GORE-TEX GRAFT RIGHT  THIGH;  Surgeon: Waynetta Sandy, MD;  Location: [place];  Service: Vascular;  Laterality: Right;  . BASCILIC VEIN TRANSPOSITION Left 06/24/2015   Procedure: BASILIC VEIN TRANSPOSITION;  Surgeon: Mal Misty, MD;  Location: [place];  Service: Vascular;  Laterality: Left;  . BIOPSY  03/14/2019   Procedure: BIOPSY;  Surgeon: Irene Shipper, MD;  Location: [place];  Service: Endoscopy;;  . CATARACT EXTRACTION, BILATERAL Bilateral   . COLONOSCOPY WITH PROPOFOL N/A 03/14/2019   Procedure: COLONOSCOPY WITH PROPOFOL;  Surgeon: Irene Shipper, MD;  Location:  [facility] ENDOSCOPY;  Service: Endoscopy;  Laterality: N/A;  . ESOPHAGOGASTRODUODENOSCOPY (EGD) WITH PROPOFOL N/A 05/05/2014   Procedure: ESOPHAGOGASTRODUODENOSCOPY (EGD) WITH PROPOFOL;  Surgeon: Missy Sabins, MD;  Location: [facility] ENDOSCOPY;  Service: Endoscopy;  Laterality: N/A;  . EYE SURGERY Bilateral    cataract surgery   . GLAUCOMA SURGERY Bilateral   . INSERTION OF ARTERIOVENOUS (AV) ARTEGRAFT ARM  10/09/2018   Procedure: INSERTION OF ARTERIOVENOUS (AV) 65mm x 41cm ARTEGRAFT LEFT UPPER ARM;  Surgeon: Waynetta Sandy, MD;  Location: [place];  Service: Vascular;;  . INSERTION OF DIALYSIS CATHETER Right 07/14/2019   Procedure: Insertion Of Dialysis Catheter;  Surgeon: Elam Dutch, MD;  Location: [facility] OR;  Service: Vascular;  Laterality: Right;  placed right Internal Jugular  . INSERTION PROSTATE RADIATION SEED    . IR FLUORO GUIDE CV LINE RIGHT  02/18/2018  . IR REMOVAL TUN CV CATH W/O FL  12/06/2018  . IR THROMBECTOMY AV FISTULA W/THROMBOLYSIS/PTA INC/SHUNT/IMG RIGHT Right 01/26/2020  . IR THROMBECTOMY AV FISTULA W/THROMBOLYSIS/PTA INC/SHUNT/IMG RIGHT Right 02/24/2020  . IR THROMBECTOMY AV FISTULA W/THROMBOLYSIS/PTA INC/SHUNT/IMG RIGHT Right 04/07/2020  . IR US GUIDE VASC ACCESS RIGHT  02/18/2018  . IR US GUIDE VASC ACCESS RIGHT  01/26/2020  . IR US GUIDE VASC ACCESS RIGHT  04/07/2020  . LEFT HEART CATH AND CORONARY ANGIOGRAPHY N/A 02/20/2018   Procedure: LEFT HEART CATH AND CORONARY ANGIOGRAPHY;  Surgeon: Jettie Booze, MD;  Location: [place] CV LAB;;; 50% ostial OM2, 25% mLAD.  Marland Kitchen LEFT HEART CATH AND CORONARY ANGIOGRAPHY  2004   mildly depressed LV systolic fx EF 16%,XWRUEA coronaries/abdominal aorta/renal arteries.  . LOWER EXTREMITY ANGIOGRAM Left 11/17/2019   Procedure: Lower Extremity Fistulogram;  Surgeon: Marty Heck, MD;  Location: [place];  Service: Vascular;  Laterality: Left;  . NM [place]  02/21/2010   No ischemia or infarction.  EF 27%.  Marland Kitchen RADIOACTIVE  SEED IMPLANT  2010   prostate cancer  . REVISION OF ARTERIOVENOUS GORETEX GRAFT Right 07/11/2019   Procedure: REVISION OF ARTERIOVENOUS GORETEX GRAFT RIGHT ARM WITH ARTEGRAFT;  Surgeon: Serafina Mitchell, MD;  Location: [place];  Service: Vascular;  Laterality: Right;  . SHUNTOGRAM Right 07/14/2019   Procedure: Shuntogram;  Surgeon: Elam Dutch, MD;  Location: [place];  Service: Vascular;  Laterality: Right;  . THROMBECTOMY AND REVISION OF ARTERIOVENTOUS (AV) GORETEX  GRAFT Right 07/14/2019   Procedure: THROMBECTOMY AND REVISION OF ARTERIOVENTOUS (AV) GORETEX  GRAFT;  Surgeon: Elam Dutch, MD;  Location: [place];  Service: Vascular;  Laterality: Right;  . THROMBECTOMY W/ EMBOLECTOMY Left 02/26/2018   Procedure: THROMBECTOMY ARTERIOVENOUS GRAFT;  Surgeon: Angelia Mould, MD;  Location: [place];  Service: Vascular;  Laterality: Left;  . THROMBECTOMY W/ EMBOLECTOMY Left 11/17/2019   Procedure: THROMBECTOMY ARTERIOVENOUS GRAFT LEFT THIGH;  Surgeon: Marty Heck, MD;  Location: [place];  Service: Vascular;  Laterality: Left;  . TRANSTHORACIC ECHOCARDIOGRAM  02/2012   [facility]) EF 45-50% with mild global HK.  Mild LVH,LA mod. dilated,mild-mod. 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 AI.  Marland Kitchen TRANSTHORACIC ECHOCARDIOGRAM  9/'18; 3/'19   a) Severely reduced EF.  25 from 30%.  GR 1 DD with diffuse ecchymosis. Mild AS & AI.  Mild LA/RA dilation; b) (post PEA arest):   Moderately dilated LV with moderate concentric virtually.  EF 15 to 20%.  GR 1 DD.  Paradoxical septal motion. Mild AI.  Mild LA dilation.  Poorly visualized RV.  Marland Kitchen TRANSTHORACIC ECHOCARDIOGRAM  02/2019   EF 20-25%.  Unable to assess diastolic function (A. Fib).  No LV apical thrombus.  Mild AI.  Mildly reduced RV function, however poorly visualized.  Marland Kitchen ULTRASOUND GUIDANCE FOR VASCULAR ACCESS  02/20/2018   Procedure: Ultrasound Guidance For Vascular Access;  Surgeon: Jettie Booze, MD;  Location: [place] CV LAB;  Service: Cardiovascular;;  . UPPER EXTREMITY VENOGRAPHY N/A 07/08/2018   Procedure: UPPER EXTREMITY VENOGRAPHY;  Surgeon: Waynetta Sandy, MD;  Location: [place] CV LAB;  Service: Cardiovascular;  Laterality: N/A;  Bilateral     Home Medications:  Prior to Admission medications   Medication Sig Start Date End Date Taking? Authorizing Provider  acetaminophen (TYLENOL) 500 MG tablet Take 1,000 mg by mouth every 6 (six) hours as needed for moderate pain.   Yes [provider]  allopurinol (ZYLOPRIM) 100 MG tablet Take 1 tablet (100 mg total) by mouth See admin instructions. Take one tablet (100 mg) by mouth Mondays, Tuesday, Thursday, Saturday after dialysis 03/25/20  Yes Croitoru, Mihai, MD  amiodarone (PACERONE) 200 MG tablet Take 1 tablet (200 mg total) by mouth daily. Patient taking differently: Take 200 mg by mouth daily at 12 noon.  05/12/19  Yes Croitoru, Mihai, MD  bictegravir-emtricitabine-tenofovir AF ([name]) 50-200-25 MG TABS tablet Take 1 tablet by mouth daily. Patient taking differently: Take 1 tablet by mouth daily at 12 noon.  09/29/19  Yes Tommy Medal, Lavell Islam, MD  brimonidine (ALPHAGAN) 0.15 % ophthalmic solution Place 1 drop into the right eye 2 (two) times daily.  11/15/17  Yes [provider]  latanoprost (XALATAN) 0.005 % ophthalmic solution Place 1 drop into the right eye at bedtime.    Yes [provider]  loratadine (CLARITIN) 10 MG tablet Take 10 mg by mouth daily at 12 noon.    Yes [provider]  midodrine (PROAMATINE) 10 MG tablet Take 1 tablet (10 mg total) by mouth as directed. Twice a day on the day of HD Tuesday, Thursday, Saturday (1 in AM and 1 at [place]). Take 1/2 tablet (5mg ) a day on non HD days Patient taking differently: Take 10 mg by mouth See admin instructions. Take 20 mg in the morning of dialysis days and 10 mg half way through treatment on dialysis days (Mon, Tues, [place], and Sat) 04/22/19   Yes Leonie Man, MD  montelukast (SINGULAIR) 10 MG tablet Take 1 tablet (10 mg total) by mouth at bedtime. NEEDS OV/FOLLOW UP 03/23/20  Yes [name], NP  Multiple Vitamin (MULTIVITAMIN WITH MINERALS) TABS tablet Take 1 tablet by mouth every evening.    Yes [provider]  polyethylene glycol (MIRALAX / GLYCOLAX) 17 g packet Take 17 g by mouth every evening.   Yes [provider]  sucroferric oxyhydroxide (VELPHORO) 500 MG chewable tablet Chew 500 mg by mouth 3 (three) times daily  with meals.   Yes [provider]  traMADol (ULTRAM) 50 MG tablet Take 1 tablet (50 mg total) by mouth every 6 (six) hours as needed. Patient taking differently: Take 50 mg by mouth every 6 (six) hours as needed for moderate pain.  01/27/20  Yes Domenic Polite, MD  warfarin (COUMADIN) 5 MG tablet Take 5mg  daily for next 3days and then check INR 2/25, goal INR 2-3 Patient taking differently: Take 5 mg by mouth See admin instructions. On Monday, Tuesday, Thursday and Saturday 05/05/20  Yes Leonie Man, MD    Inpatient Medications: Scheduled Meds: . allopurinol  100 mg Oral Once per day on Mon Tue Thu Sat  . amiodarone  200 mg Oral Q1200  . bictegravir-emtricitabine-tenofovir AF  1 tablet Oral Q1200  . brimonidine  1 drop Right Eye BID  . [START ON 05/18/2020] calcitRIOL  0.5 mcg Oral Q T,Th,Sa-HD  . Chlorhexidine Gluconate Cloth  6 each Topical Q0600  . Chlorhexidine Gluconate Cloth  6 each Topical Q0600  . [START ON 05/17/2020] darbepoetin (ARANESP) injection - DIALYSIS  40 mcg Intravenous Q Mon-HD  . latanoprost  1 drop Right Eye QHS  . loratadine  10 mg Oral Q1200  . midodrine  10 mg Oral Once per day on Mon Tue Thu Sat  . midodrine  20 mg Oral Once per day on Mon Tue Thu Sat  . midodrine  5 mg Oral Once per day on Sun Wed Fri  . montelukast  10 mg Oral QHS  . multivitamin  1 tablet Oral QHS  . polyethylene glycol  17 g Oral QPM  . sucroferric oxyhydroxide  500 mg  Oral TID WC   Continuous Infusions:  PRN Meds: ondansetron **OR** ondansetron (ZOFRAN) IV, traMADol  Allergies:    Allergies  Allergen Reactions  . Dextromethorphan-Guaifenesin Other (See Comments)    Unknown reaction  . Tocotrienols Other (See Comments)    Unknown reaction  . Losartan Potassium Other (See Comments)    Causes constipation    Social History:   Social History   Socioeconomic History  . Marital status: Married    Spouse name: Not on file  . Number of children: 2  . Years of education: 58  . Highest education level: Not on file  Occupational History  . 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 Smoking status: Former Smoker    Packs/day: 1.00    Years: 30.00    Pack years: 30.00    Types: Cigarettes    Quit date: 2006    Years since quitting: 15.4  . Smokeless tobacco: Never Used  Vaping Use  . Vaping Use: Never used  Substance and Sexual Activity  . Alcohol use: Yes    Alcohol/week: 1.0 standard drink    Types: 1 Shots of liquor per week    Comment: occasional  . Drug use: Never  . Sexual activity: Not Currently    Comment: declined condoms 09/2019  Other Topics Concern  . 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   Social Determinants of Health   Financial Resource Strain:   . Difficulty of Paying Living Expenses:  Food Insecurity: No Food Insecurity  . Worried About Charity fundraiser in the Last Year: Never true  . Ran Out of Food in the Last Year: Never true  Transportation Needs: No Transportation Needs  . Lack of Transportation (Medical): No  . Lack of Transportation (Non-Medical): No  Physical Activity:   . Days of Exercise per Week:   . Minutes of Exercise per Session:   Stress:   . Feeling  of Stress :   Social Connections:   . Frequency of Communication with Friends and Family:   . Frequency of Social Gatherings with Friends and Family:   . Attends Religious Services:   . Active Member of Clubs or Organizations:   . Attends Archivist Meetings:   Marland Kitchen Marital Status:   Intimate Partner Violence:   . Fear of Current or Ex-Partner:   . Emotionally Abused:   Marland Kitchen Physically Abused:   . Sexually Abused:     Family History:    Family History  Problem Relation Age of Onset  . Hypertension Mother   . Thyroid disease Mother   . Cholelithiasis Daughter   . Cholelithiasis Son   . Hypertension Maternal Grandmother   . Diabetes Maternal Grandmother   . Heart attack Neg Hx   . Hyperlipidemia Neg Hx      ROS:  Please see the history of present illness.   All other ROS reviewed and negative.     Physical Exam/Data:   Vitals:   05/16/20 0600 05/16/20 0630 05/16/20 0645 05/16/20 0855  BP: (!) 81/62 (!) 82/61 (!) 95/49 (!) 98/52  Pulse: 69 70  76  Resp:   18 18  Temp:   97.7 F (36.5 C) 98 F (36.7 C)  TempSrc:   Oral Oral  SpO2:   98% 98%  Weight:   83.2 kg   Height:        Intake/Output Summary (Last 24 hours) at 05/16/2020 1214 Last data filed at 05/16/2020 0900 Gross per 24 hour  Intake 240 ml  Output 892 ml  Net -652 ml   Last 3 Weights 05/16/2020 05/16/2020 05/15/2020  Weight (lbs) 183 lb 6.8 oz 185 lb 3 oz 185 lb 0.1 oz  Weight (kg) 83.2 kg 84 kg 83.917 kg     Body mass index is 23.55 kg/m.  General:  Chronically ill HEENT: normal Neck: no JVD Cardiac:  RRR Lungs:  Normal WOB Abd: soft  Ext: no edema Musculoskeletal:  Diffuse atrophy Skin: very dry  Psych:  Normal affect   EKG:  The EKG was personally reviewed and demonstrates:  02/02/20- sinus rhythm with LBBB    Relevant CV Studies: Echo 03/04/19- EF 20%, diffuse LV HK, LVEDD 5.96cm  Laboratory Data:  High Sensitivity Troponin:  No results for input(s): TROPONINIHS in the last 720  hours.   Chemistry Recent Labs  Lab 05/14/20 1651 05/15/20 0452 05/16/20 0724  NA 138 138 137  K 5.2* 5.3* 3.8  CL 98 98 99  CO2 26 26 26   GLUCOSE 119* 88 77  BUN 31* 39* 23  CREATININE 8.38* 9.62* 5.96*  CALCIUM 9.8 9.4 8.0*  GFRNONAA 6* 5* 8*  GFRAA 6* 5* 10*  ANIONGAP 14 14 12     Recent Labs  Lab 05/14/20 1651 05/15/20 0452 05/16/20 0724  PROT 7.4 6.7 6.4*  ALBUMIN 3.9 3.6 3.2*  AST 501* 291* 144*  ALT 489* 391* 279*  ALKPHOS 311* 341* 341*  BILITOT 3.1* 3.4* 1.5*   Hematology Recent  Labs  Lab 05/14/20 1651 05/15/20 0452 05/16/20 0724  WBC 7.2 5.0 4.5  RBC 2.98* 2.74* 2.64*  HGB 10.5* 9.6* 9.4*  HCT 32.9* 29.7* 28.3*  MCV 110.4* 108.4* 107.2*  MCH 35.2* 35.0* 35.6*  MCHC 31.9 32.3 33.2  RDW 16.8* 17.0* 16.8*  PLT 209 183 153   BNPNo results for input(s): BNP, PROBNP in the last 168 hours.  DDimer No results for input(s): DDIMER in the last 168 hours.   Radiology/Studies:  MR ABDOMEN MRCP W WO CONTAST  Result Date: 05/15/2020 CLINICAL DATA:  Abdominal pain, unspecified abdominal pain EXAM: MRI ABDOMEN WITHOUT AND WITH CONTRAST (INCLUDING MRCP) TECHNIQUE: Multiplanar multisequence MR imaging of the abdomen was performed both before and after the administration of intravenous contrast. Heavily T2-weighted images of the biliary and pancreatic ducts were obtained, and three-dimensional MRCP images were rendered by post processing. CONTRAST:  8mL GADAVIST GADOBUTROL 1 MMOL/ML IV SOLN COMPARISON:  Abdominal sonogram of May 15, 2020 FINDINGS: Lower chest: Marked cardiomegaly without pericardial effusion. No pleural effusion. Limited assessment of the lung bases. Hepatobiliary: Marked hepatic iron deposition without focal, suspicious hepatic lesion. Small amount of pericholecystic fluid. Minimal wall thickening with some uniform enhancement that is not significantly changed compared to a CT from 01/25/2020. No signs of cholelithiasis, sludge layering in the  gallbladder. No choledocholithiasis. No biliary ductal dilation. Pancreas:  Pancreas is normal. Spleen: Spleen normal size and contour with splenic iron deposition as well. Adrenals/Urinary Tract: Adrenal glands with profound loss of signal on in phase as compared to out of phase T1 weighted gradient echo imaging. Bilateral cysts in the kidneys with simple and hemorrhagic features. No concerning renal lesion. Signs of cortical atrophy of the bilateral kidneys symmetric and bilateral. Stomach/Bowel: No acute gastrointestinal process to the extent visualized. Study not protocol for bowel evaluation. Vascular/Lymphatic: Vascular structures in the abdomen are patent. No adenopathy. Other:  No ascites.  Small pericholecystic fluid. Musculoskeletal: Profound iron deposition in marrow spaces. IMPRESSION: 1. Gallbladder wall thickening and pericholecystic fluid similar to the recent ultrasound evaluation. Gallbladder wall enhancement is similar accounting for differences in technique when compared to the CT study from February of 2021. Findings remain equivocal for acute cholecystitis, question remains given small amount of pericholecystic fluid detected on current exam. Correlation with HIDA scan may be helpful. 2. No filling defect in the common bile duct. Sludge in the gallbladder. It is possible that the constellation of findings could be related to recently passed calculus or hepatic dysfunction. 3. Signs of iron overload. This could be seen in the setting of treatment of anemia with Feraheme. There are no signs of recent Feraheme administration I 80 increased signal in the vasculature on precontrast imaging. 4. Iron deposition in the adrenal glands can also be seen with this medication/treatment. Correlation with adrenal function may be helpful. 5. Bilateral renal cysts. Electronically Signed   By: Zetta Bills M.D.   On: 05/15/2020 19:25   US Abdomen Limited RUQ  Result Date: 05/15/2020 CLINICAL DATA:  Right  upper quadrant abdominal pain EXAM: ULTRASOUND ABDOMEN LIMITED RIGHT UPPER QUADRANT COMPARISON:  May 10, 2014 FINDINGS: Gallbladder: There is diffuse gallbladder wall thickening, measuring up to approximately 1.5 cm. There is gallbladder sludge. There are no distinct gallstones. The sonographic Percell Miller sign is negative. Common bile duct: Diameter: 5 mm Liver: No focal lesion identified. Within normal limits in parenchymal echogenicity. Portal vein is patent on color Doppler imaging with normal direction of blood flow towards the liver. Other: The right kidney  is echogenic without evidence for hydronephrosis. IMPRESSION: 1. There is gallbladder wall thickening which is nonspecific. This can be seen in patients with heart failure among other causes. In the absence of a positive sonographic Murphy sign, findings are less likely to represent acute cholecystitis. 2. Echogenic right kidney consistent with medical renal disease. Electronically Signed   By: Constance Holster M.D.   On: 05/15/2020 01:15   {   Assessment and Plan:   1. preop assessment He appears euvolemic.  He has very poor functional capacity due to multiple illness.  He has a nonischemic CM with EF 20%.  Medical therapy has been limited by low BP with HD. At this time, appears to be at high baseline.  I do not feel that additional CV testing or procedures would reduce his surgical risk, though he would certainly be high risk for any procedures. Proceed if medically necessary without further CV testing.  2. Nonischemic CM/ chronic systolic dysfunction As above  3. Paroxysmal atrial fibrillation Stable No changes Continue amiodarone (see below) Resume coumadin post operatively when able  4. ESRD Per nephrology  5. Abnormal LFTs Likely to gall bladder disease Could also be due to amiodarone.  We may need to stop amiodarone if not symptoms/ abnormal LFTs do not resolve post operatively     For questions or updates, please contact  [place] Please consult [email or web address] for contact info under    Signed, Thompson Grayer, MD  05/16/2020 12:14 PM

## 2020-05-16 NOTE — Progress Notes (Signed)
PROGRESS NOTE    [facility]  PFX:[id number] DOB: Sep 06, 1941 DOA: 05/14/2020 PCP: [name], NP  [place] a 78/M with history of ESRD on hemodialysis MTTS, CAD, chronic systolic CHF, EF of 32%, paroxysmal atrial fibrillation on Coumadin, HIV, presented to the emergency room with abdominal pain, vomiting, on further evaluation noted to have LFTs in the 500s and T bili of 3.1, RUQ [place]: gall bladder wall thickening and sludge   Assessment & Plan:  Abd pain/vomiting, gallbladder sludge Abnormal LFTS -Clinically suspect he passed a stone -LFTs improving, symptoms have resolved and exam is benign -MRCP 6/12 - for filling defect, sludge present -Appreciate general surgery input, laparoscopic cholecystectomy being considered, cards consult requested for optimization  P.Afib -rate controlled, hold coumadin pending further workup -In sinus rhythm, continue amiodarone  Chronic systolic and diastolic CHF Coronary artery disease -EF of 20%, volume managed with dialysis -Not on cardiac meds due to chronic hypotension -Last cath 02/2018 with nonobstructive CAD -Continue midodrine  ESRD on hemodialysis -On dialysis Monday Tuesday Thursday Saturday -Nephrology consulted, underwent dialysis overnight, has a right IJ HD catheter  Macrocytic anemia: Chronic. -Hemoglobin at baseline, monitor  History of HIV: CD4 T-cell absolute count 406 on 01/21/20. -ContinueBiktarvy   DVT prophylaxis: Holding Coumadin Code Status: Full code Family Communication: Discussed with patient in detail Disposition Plan:  Status is: The patient will require care spanning > 2 midnights and should be moved to inpatient because: Ongoing diagnostic testing needed not appropriate for outpatient work up  Dispo: The patient is from: Home              Anticipated d/c is to: Home              Anticipated d/c date is: To be determined, surgery considering lap chole               Patient currently is not medically stable to d/c.   Consultants:   General surgery, cardiology   Procedures:   Antimicrobials:    Subjective: -Feels okay, denies any pain, no nausea or vomiting  Objective: Vitals:   05/16/20 0600 05/16/20 0630 05/16/20 0645 05/16/20 0855  BP: (!) 81/62 (!) 82/61 (!) 95/49 (!) 98/52  Pulse: 69 70  76  Resp:   18 18  Temp:   97.7 F (36.5 C) 98 F (36.7 C)  TempSrc:   Oral Oral  SpO2:   98% 98%  Weight:   83.2 kg   Height:        Intake/Output Summary (Last 24 hours) at 05/16/2020 1212 Last data filed at 05/16/2020 0900 Gross per 24 hour  Intake 240 ml  Output 892 ml  Net -652 ml   Filed Weights   05/15/20 2108 05/16/20 0315 05/16/20 0645  Weight: 83.9 kg 84 kg 83.2 kg    Examination:  General exam: Elderly male sitting up in bed, AAOx3, no distress HEENT: No JVD CVS, S1-S2, regular rate rhythm Lungs: Diminished breath sounds at both bases Abdomen: Soft, nontender, nondistended, bowel sounds present Extremities: No edema  Skin: No rashes on exposed skin, right IJ HD catheter Psychiatry:  Mood & affect appropriate.     Data Reviewed:   CBC: Recent Labs  Lab 05/14/20 1651 05/15/20 0452 05/16/20 0724  WBC 7.2 5.0 4.5  HGB 10.5* 9.6* 9.4*  HCT 32.9* 29.7* 28.3*  MCV 110.4* 108.4* 107.2*  PLT 209 183 992   Basic Metabolic Panel: Recent Labs  Lab 05/14/20 1651 05/15/20 0452 05/16/20 0724  NA 138 138 137  K 5.2* 5.3* 3.8  CL 98 98 99  CO2 26 26 26   GLUCOSE 119* 88 77  BUN 31* 39* 23  CREATININE 8.38* 9.62* 5.96*  CALCIUM 9.8 9.4 8.0*   GFR: Estimated Creatinine Clearance: 12.1 mL/min (A) (by C-G formula based on SCr of 5.96 mg/dL (H)). Liver Function Tests: Recent Labs  Lab 05/14/20 1651 05/15/20 0452 05/16/20 0724  AST 501* 291* 144*  ALT 489* 391* 279*  ALKPHOS 311* 341* 341*  BILITOT 3.1* 3.4* 1.5*  PROT 7.4 6.7 6.4*  ALBUMIN 3.9 3.6 3.2*   Recent Labs  Lab 05/14/20 1651  LIPASE  37   No results for input(s): AMMONIA in the last 168 hours. Coagulation Profile: Recent Labs  Lab 05/15/20 0452  INR 1.2   Cardiac Enzymes: No results for input(s): CKTOTAL, CKMB, CKMBINDEX, TROPONINI in the last 168 hours. BNP (last 3 results) No results for input(s): PROBNP in the last 8760 hours. HbA1C: No results for input(s): HGBA1C in the last 72 hours. CBG: No results for input(s): GLUCAP in the last 168 hours. Lipid Profile: No results for input(s): CHOL, HDL, LDLCALC, TRIG, CHOLHDL, LDLDIRECT in the last 72 hours. Thyroid Function Tests: No results for input(s): TSH, T4TOTAL, FREET4, T3FREE, THYROIDAB in the last 72 hours. Anemia Panel: No results for input(s): VITAMINB12, FOLATE, FERRITIN, TIBC, IRON, RETICCTPCT in the last 72 hours. Urine analysis:    Component Value Date/Time   COLORURINE YELLOW 02/17/2018 0540   APPEARANCEUR CLOUDY (A) 02/17/2018 0540   LABSPEC 1.010 02/17/2018 0540   PHURINE 5.0 02/17/2018 0540   GLUCOSEU NEGATIVE 02/17/2018 0540   HGBUR LARGE (A) 02/17/2018 0540   BILIRUBINUR NEGATIVE 02/17/2018 0540   BILIRUBINUR negative 04/16/2013 1447   KETONESUR NEGATIVE 02/17/2018 0540   PROTEINUR 100 (A) 02/17/2018 0540   UROBILINOGEN 0.2 05/11/2014 1352   NITRITE NEGATIVE 02/17/2018 0540   LEUKOCYTESUR NEGATIVE 02/17/2018 0540   Sepsis Labs: @LABRCNTIP (procalcitonin:4,lacticidven:4)  ) Recent Results (from the past 240 hour(s))  SARS Coronavirus 2 by RT PCR (hospital order, performed in [facility] hospital lab) Nasopharyngeal Nasopharyngeal Swab     Status: None   Collection Time: 05/15/20  2:11 AM   Specimen: Nasopharyngeal Swab  Result Value Ref Range Status   SARS Coronavirus 2 NEGATIVE NEGATIVE Final    Comment: (NOTE) SARS-CoV-2 target nucleic acids are NOT DETECTED.  The SARS-CoV-2 RNA is generally detectable in upper and lower respiratory specimens during the acute phase of infection. The lowest concentration of SARS-CoV-2 viral  copies this assay can detect is 250 copies / mL. A negative result does not preclude SARS-CoV-2 infection and should not be used as the sole basis for treatment or other patient management decisions.  A negative result may occur with improper specimen collection / handling, submission of specimen other than nasopharyngeal swab, presence of viral mutation(s) within the areas targeted by this assay, and inadequate number of viral copies (<250 copies / mL). A negative result must be combined with clinical observations, patient history, and epidemiological information.  Fact Sheet for Patients:   StrictlyIdeas.no  Fact Sheet for Healthcare Providers: BankingDealers.co.za  This test is not yet approved or  cleared by the [place] FDA and has been authorized for detection and/or diagnosis of SARS-CoV-2 by FDA under an Emergency Use Authorization (EUA).  This EUA will remain in effect (meaning this test can be used) for the duration of the COVID-19 declaration under Section 564(b)(1) of the Act, 21 U.S.C. section 360bbb-3(b)(1), unless the  authorization is terminated or revoked sooner.  Performed at [facility], [place] [address] [place], [place] [address]          Radiology Studies: MR ABDOMEN MRCP W WO CONTAST  Result Date: 05/15/2020 CLINICAL DATA:  Abdominal pain, unspecified abdominal pain EXAM: MRI ABDOMEN WITHOUT AND WITH CONTRAST (INCLUDING MRCP) TECHNIQUE: Multiplanar multisequence MR imaging of the abdomen was performed both before and after the administration of intravenous contrast. Heavily T2-weighted images of the biliary and pancreatic ducts were obtained, and three-dimensional MRCP images were rendered by post processing. CONTRAST:  68mL GADAVIST GADOBUTROL 1 MMOL/ML IV SOLN COMPARISON:  Abdominal sonogram of May 15, 2020 FINDINGS: Lower chest: Marked cardiomegaly without pericardial effusion. No pleural effusion.  Limited assessment of the lung bases. Hepatobiliary: Marked hepatic iron deposition without focal, suspicious hepatic lesion. Small amount of pericholecystic fluid. Minimal wall thickening with some uniform enhancement that is not significantly changed compared to a CT from 01/25/2020. No signs of cholelithiasis, sludge layering in the gallbladder. No choledocholithiasis. No biliary ductal dilation. Pancreas:  Pancreas is normal. Spleen: Spleen normal size and contour with splenic iron deposition as well. Adrenals/Urinary Tract: Adrenal glands with profound loss of signal on in phase as compared to out of phase T1 weighted gradient echo imaging. Bilateral cysts in the kidneys with simple and hemorrhagic features. No concerning renal lesion. Signs of cortical atrophy of the bilateral kidneys symmetric and bilateral. Stomach/Bowel: No acute gastrointestinal process to the extent visualized. Study not protocol for bowel evaluation. Vascular/Lymphatic: Vascular structures in the abdomen are patent. No adenopathy. Other:  No ascites.  Small pericholecystic fluid. Musculoskeletal: Profound iron deposition in marrow spaces. IMPRESSION: 1. Gallbladder wall thickening and pericholecystic fluid similar to the recent ultrasound evaluation. Gallbladder wall enhancement is similar accounting for differences in technique when compared to the CT study from February of 2021. Findings remain equivocal for acute cholecystitis, question remains given small amount of pericholecystic fluid detected on current exam. Correlation with HIDA scan may be helpful. 2. No filling defect in the common bile duct. Sludge in the gallbladder. It is possible that the constellation of findings could be related to recently passed calculus or hepatic dysfunction. 3. Signs of iron overload. This could be seen in the setting of treatment of anemia with Feraheme. There are no signs of recent Feraheme administration I 80 increased signal in the vasculature  on precontrast imaging. 4. Iron deposition in the adrenal glands can also be seen with this medication/treatment. Correlation with adrenal function may be helpful. 5. Bilateral renal cysts. Electronically Signed   By: Zetta Bills M.D.   On: 05/15/2020 19:25   US Abdomen Limited RUQ  Result Date: 05/15/2020 CLINICAL DATA:  Right upper quadrant abdominal pain EXAM: ULTRASOUND ABDOMEN LIMITED RIGHT UPPER QUADRANT COMPARISON:  May 10, 2014 FINDINGS: Gallbladder: There is diffuse gallbladder wall thickening, measuring up to approximately 1.5 cm. There is gallbladder sludge. There are no distinct gallstones. The sonographic Percell Miller sign is negative. Common bile duct: Diameter: 5 mm Liver: No focal lesion identified. Within normal limits in parenchymal echogenicity. Portal vein is patent on color Doppler imaging with normal direction of blood flow towards the liver. Other: The right kidney is echogenic without evidence for hydronephrosis. IMPRESSION: 1. There is gallbladder wall thickening which is nonspecific. This can be seen in patients with heart failure among other causes. In the absence of a positive sonographic Murphy sign, findings are less likely to represent acute cholecystitis. 2. Echogenic right kidney consistent with medical  renal disease. Electronically Signed   By: Constance Holster M.D.   On: 05/15/2020 01:15        Scheduled Meds: . allopurinol  100 mg Oral Once per day on Mon Tue Thu Sat  . amiodarone  200 mg Oral Q1200  . bictegravir-emtricitabine-tenofovir AF  1 tablet Oral Q1200  . brimonidine  1 drop Right Eye BID  . [START ON 05/18/2020] calcitRIOL  0.5 mcg Oral Q T,Th,Sa-HD  . Chlorhexidine Gluconate Cloth  6 each Topical Q0600  . Chlorhexidine Gluconate Cloth  6 each Topical Q0600  . [START ON 05/17/2020] darbepoetin (ARANESP) injection - DIALYSIS  40 mcg Intravenous Q Mon-HD  . latanoprost  1 drop Right Eye QHS  . loratadine  10 mg Oral Q1200  . midodrine  10 mg Oral Once  per day on Mon Tue Thu Sat  . midodrine  20 mg Oral Once per day on Mon Tue Thu Sat  . midodrine  5 mg Oral Once per day on Sun Wed Fri  . montelukast  10 mg Oral QHS  . multivitamin  1 tablet Oral QHS  . polyethylene glycol  17 g Oral QPM  . sucroferric oxyhydroxide  500 mg Oral TID WC   Continuous Infusions:   LOS: 0 days    Time spent: 39min    Domenic Polite, MD [facility] 05/16/2020, 12:12 PM

## 2020-05-17 DIAGNOSIS — R7401 Elevation of levels of liver transaminase levels: Secondary | ICD-10-CM

## 2020-05-17 DIAGNOSIS — Z992 Dependence on renal dialysis: Secondary | ICD-10-CM

## 2020-05-17 DIAGNOSIS — R109 Unspecified abdominal pain: Secondary | ICD-10-CM

## 2020-05-17 DIAGNOSIS — N186 End stage renal disease: Secondary | ICD-10-CM

## 2020-05-17 DIAGNOSIS — R1013 Epigastric pain: Secondary | ICD-10-CM

## 2020-05-17 LAB — CBC
HCT: 28.4 % — ABNORMAL LOW (ref 39.0–52.0)
HCT: 29.2 % — ABNORMAL LOW (ref 39.0–52.0)
Hemoglobin: 9.4 g/dL — ABNORMAL LOW (ref 13.0–17.0)
Hemoglobin: 9.3 g/dL — ABNORMAL LOW (ref 13.0–17.0)
MCH: 36 pg — ABNORMAL HIGH (ref 26.0–34.0)
MCH: 35.1 pg — ABNORMAL HIGH (ref 26.0–34.0)
MCHC: 33.1 g/dL (ref 30.0–36.0)
MCHC: 31.8 g/dL (ref 30.0–36.0)
MCV: 108.8 fL — ABNORMAL HIGH (ref 80.0–100.0)
MCV: 110.2 fL — ABNORMAL HIGH (ref 80.0–100.0)
Platelets: 170 10*3/uL (ref 150–400)
Platelets: 199 10*3/uL (ref 150–400)
RBC: 2.61 MIL/uL — ABNORMAL LOW (ref 4.22–5.81)
RBC: 2.65 MIL/uL — ABNORMAL LOW (ref 4.22–5.81)
RDW: 16.9 % — ABNORMAL HIGH (ref 11.5–15.5)
RDW: 16.9 % — ABNORMAL HIGH (ref 11.5–15.5)
WBC: 5.7 10*3/uL (ref 4.0–10.5)
WBC: 6.2 10*3/uL (ref 4.0–10.5)
nRBC: 0 % (ref 0.0–0.2)
nRBC: 0 % (ref 0.0–0.2)

## 2020-05-17 LAB — RENAL FUNCTION PANEL
Albumin: 3.3 g/dL — ABNORMAL LOW (ref 3.5–5.0)
Anion gap: 14 (ref 5–15)
BUN: 38 mg/dL — ABNORMAL HIGH (ref 8–23)
CO2: 25 mmol/L (ref 22–32)
Calcium: 9 mg/dL (ref 8.9–10.3)
Chloride: 98 mmol/L (ref 98–111)
Creatinine, Ser: 9.14 mg/dL — ABNORMAL HIGH (ref 0.61–1.24)
GFR calc Af Amer: 6 mL/min — ABNORMAL LOW (ref >60)
GFR calc non Af Amer: 5 mL/min — ABNORMAL LOW (ref >60)
Glucose, Bld: 103 mg/dL — ABNORMAL HIGH (ref 70–99)
Phosphorus: 4.7 mg/dL — ABNORMAL HIGH (ref 2.5–4.6)
Potassium: 4.4 mmol/L (ref 3.5–5.1)
Sodium: 137 mmol/L (ref 135–145)

## 2020-05-17 LAB — COMPREHENSIVE METABOLIC PANEL
ALT: 206 U/L — ABNORMAL HIGH (ref 0–44)
AST: 79 U/L — ABNORMAL HIGH (ref 15–41)
Albumin: 3.2 g/dL — ABNORMAL LOW (ref 3.5–5.0)
Alkaline Phosphatase: 297 U/L — ABNORMAL HIGH (ref 38–126)
Anion gap: 12 (ref 5–15)
BUN: 38 mg/dL — ABNORMAL HIGH (ref 8–23)
CO2: 26 mmol/L (ref 22–32)
Calcium: 8.9 mg/dL (ref 8.9–10.3)
Chloride: 99 mmol/L (ref 98–111)
Creatinine, Ser: 9.2 mg/dL — ABNORMAL HIGH (ref 0.61–1.24)
GFR calc Af Amer: 6 mL/min — ABNORMAL LOW (ref >60)
GFR calc non Af Amer: 5 mL/min — ABNORMAL LOW (ref >60)
Glucose, Bld: 96 mg/dL (ref 70–99)
Potassium: 4.8 mmol/L (ref 3.5–5.1)
Sodium: 137 mmol/L (ref 135–145)
Total Bilirubin: 1.1 mg/dL (ref 0.3–1.2)
Total Protein: 6.2 g/dL — ABNORMAL LOW (ref 6.5–8.1)

## 2020-05-17 MED ORDER — DARBEPOETIN ALFA 40 MCG/0.4ML IJ SOSY
PREFILLED_SYRINGE | INTRAMUSCULAR | Status: AC
  Administered 2020-05-17: 40 ug via INTRAVENOUS
  Filled 2020-05-17: qty 0.4

## 2020-05-17 MED ORDER — HEPARIN SODIUM (PORCINE) 1000 UNIT/ML IJ SOLN
INTRAMUSCULAR | Status: AC
  Filled 2020-05-17: qty 1

## 2020-05-17 MED ORDER — MIDODRINE HCL 5 MG PO TABS
ORAL_TABLET | ORAL | Status: AC
  Filled 2020-05-17: qty 2

## 2020-05-17 MED ORDER — MIDODRINE HCL 5 MG PO TABS
ORAL_TABLET | ORAL | Status: AC
  Administered 2020-05-17: 10 mg via ORAL
  Filled 2020-05-17: qty 4

## 2020-05-17 MED ORDER — CHLORHEXIDINE GLUCONATE CLOTH 2 % EX PADS
6.0000 | MEDICATED_PAD | Freq: Every day | CUTANEOUS | Status: DC
  Administered 2020-05-17 – 2020-05-18 (×2): 6 via TOPICAL

## 2020-05-17 NOTE — Progress Notes (Signed)
[facility] Progress Note   Subjective:   Patient seen on HD. No complaints today. Denies SOB, CP, palpitations, dizziness, abdominal pain, N/V/D.   Objective Vitals:   05/16/20 0855 05/16/20 1640 05/16/20 2046 05/17/20 0458  BP: (!) 98/52 (!) 103/92 120/60 130/67  Pulse: 76 82 73 72  Resp: 18 18 18 18   Temp: 98 F (36.7 C) 97.9 F (36.6 C) 98.3 F (36.8 C) 98 F (36.7 C)  TempSrc: Oral Oral  Oral  SpO2: 98% 100% 100% 97%  Weight:      Height:       Physical Exam General: Well developed male, alert and in NAD Heart: RRR, no murmurs, rubs or gallops Lungs: CTA bilaterally without wheezing, rhonchi or rales Abdomen: Soft, non-tender, non-distended. +BS Extremities: No edema b/l lower extremities Dialysis Access:  R IJ [facility] currently accessed  Additional Objective Labs: Basic Metabolic Panel: Recent Labs  Lab 05/16/20 0724 05/17/20 0609 05/17/20 0715  NA 137 137 137  K 3.8 4.8 4.4  CL 99 99 98  CO2 26 26 25   GLUCOSE 77 96 103*  BUN 23 38* 38*  CREATININE 5.96* 9.20* 9.14*  CALCIUM 8.0* 8.9 9.0  PHOS  --   --  4.7*   Liver Function Tests: Recent Labs  Lab 05/15/20 0452 05/15/20 0452 05/16/20 0724 05/17/20 0609 05/17/20 0715  AST 291*  --  144* 79*  --   ALT 391*  --  279* 206*  --   ALKPHOS 341*  --  341* 297*  --   BILITOT 3.4*  --  1.5* 1.1  --   PROT 6.7  --  6.4* 6.2*  --   ALBUMIN 3.6   < > 3.2* 3.2* 3.3*   < > = values in this interval not displayed.   Recent Labs  Lab 05/14/20 1651  LIPASE 37   CBC: Recent Labs  Lab 05/14/20 1651 05/14/20 1651 05/15/20 0452 05/15/20 0452 05/16/20 0724 05/17/20 0609 05/17/20 0716  WBC 7.2   < > 5.0   < > 4.5 5.7 6.2  HGB 10.5*   < > 9.6*   < > 9.4* 9.4* 9.3*  HCT 32.9*   < > 29.7*   < > 28.3* 28.4* 29.2*  MCV 110.4*  --  108.4*  --  107.2* 108.8* 110.2*  PLT 209   < > 183   < > 153 170 199   < > = values in this interval not displayed.   Blood Culture    Component Value Date/Time    SDES BLOOD RIGHT HAND 12/11/2019 2100   SPECREQUEST  12/11/2019 2100    BOTTLES DRAWN AEROBIC ONLY Blood Culture results may not be optimal due to an inadequate volume of blood received in culture bottles   CULT  12/11/2019 2100    NO GROWTH 5 DAYS Performed at [facility] [address], [place], [place] [address]    REPTSTATUS 12/16/2019 FINAL 12/11/2019 2100    Studies/Results: MR ABDOMEN MRCP W WO CONTAST  Result Date: 05/15/2020 CLINICAL DATA:  Abdominal pain, unspecified abdominal pain EXAM: MRI ABDOMEN WITHOUT AND WITH CONTRAST (INCLUDING MRCP) TECHNIQUE: Multiplanar multisequence MR imaging of the abdomen was performed both before and after the administration of intravenous contrast. Heavily T2-weighted images of the biliary and pancreatic ducts were obtained, and three-dimensional MRCP images were rendered by post processing. CONTRAST:  79mL GADAVIST GADOBUTROL 1 MMOL/ML IV SOLN COMPARISON:  Abdominal sonogram of May 15, 2020 FINDINGS: Lower chest: Marked cardiomegaly  without pericardial effusion. No pleural effusion. Limited assessment of the lung bases. Hepatobiliary: Marked hepatic iron deposition without focal, suspicious hepatic lesion. Small amount of pericholecystic fluid. Minimal wall thickening with some uniform enhancement that is not significantly changed compared to a CT from 01/25/2020. No signs of cholelithiasis, sludge layering in the gallbladder. No choledocholithiasis. No biliary ductal dilation. Pancreas:  Pancreas is normal. Spleen: Spleen normal size and contour with splenic iron deposition as well. Adrenals/Urinary Tract: Adrenal glands with profound loss of signal on in phase as compared to out of phase T1 weighted gradient echo imaging. Bilateral cysts in the kidneys with simple and hemorrhagic features. No concerning renal lesion. Signs of cortical atrophy of the bilateral kidneys symmetric and bilateral. Stomach/Bowel: No acute gastrointestinal process to the  extent visualized. Study not protocol for bowel evaluation. Vascular/Lymphatic: Vascular structures in the abdomen are patent. No adenopathy. Other:  No ascites.  Small pericholecystic fluid. Musculoskeletal: Profound iron deposition in marrow spaces. IMPRESSION: 1. Gallbladder wall thickening and pericholecystic fluid similar to the recent ultrasound evaluation. Gallbladder wall enhancement is similar accounting for differences in technique when compared to the CT study from February of 2021. Findings remain equivocal for acute cholecystitis, question remains given small amount of pericholecystic fluid detected on current exam. Correlation with HIDA scan may be helpful. 2. No filling defect in the common bile duct. Sludge in the gallbladder. It is possible that the constellation of findings could be related to recently passed calculus or hepatic dysfunction. 3. Signs of iron overload. This could be seen in the setting of treatment of anemia with Feraheme. There are no signs of recent Feraheme administration I 80 increased signal in the vasculature on precontrast imaging. 4. Iron deposition in the adrenal glands can also be seen with this medication/treatment. Correlation with adrenal function may be helpful. 5. Bilateral renal cysts. Electronically Signed   By: Zetta Bills M.D.   On: 05/15/2020 19:25   Medications:  . allopurinol  100 mg Oral Once per day on Mon Tue Thu Sat  . amiodarone  200 mg Oral Q1200  . bictegravir-emtricitabine-tenofovir AF  1 tablet Oral Q1200  . brimonidine  1 drop Right Eye BID  . [START ON 05/18/2020] calcitRIOL  0.5 mcg Oral Q T,Th,Sa-HD  . Chlorhexidine Gluconate Cloth  6 each Topical Q0600  . Chlorhexidine Gluconate Cloth  6 each Topical Q0600  . darbepoetin (ARANESP) injection - DIALYSIS  40 mcg Intravenous Q Mon-HD  . latanoprost  1 drop Right Eye QHS  . loratadine  10 mg Oral Q1200  . midodrine      . midodrine  10 mg Oral Once per day on Mon Tue Thu Sat  .  midodrine  20 mg Oral Once per day on Mon Tue Thu Sat  . midodrine  5 mg Oral Once per day on Sun Wed Fri  . montelukast  10 mg Oral QHS  . multivitamin  1 tablet Oral QHS  . polyethylene glycol  17 g Oral QPM  . sucroferric oxyhydroxide  500 mg Oral TID WC    [facility], 180NRe, BFR 400, DFR 600, EDW 84kg, 3K, 2Ca, UF profile 4,TDC Heparin 2000 unit bolus Mircera 37mcg IVP q HD- last dose 5/1, Hgb <11 Calcitriol 0.36mcg PO q HD  Assessment/Plan: 1. Gallbladder sludge, elevated LFTS: seen by GI, suspected to have passed a small stone. Next step MRCP and possibly lap chole eventually. Currently feeling well with no abdominal pain. Seen  by cardiology yesterday pre-op. 2. ESRD:On MTTS schedule for volume management. K+ 4.4. Next HD 6/15. TDC dependent at this time- AVG recently clotted and has been referred back to [facility] as outpatient but has limited access options at this point. 3. Hypertension: BP chronically soft on HD. Takes midodrine pre-HD outpatient, will continue here. Also restarted midodrine 5mg  on non-HD days yesterday.  4. Anemia:Hgb 9.3, last ESA dose held for Hgb >11, will restart ESA with HD today. 5. Metabolic bone disease:Calcium and phos at goal. Continue VDRA and binders.  6. Nutrition:Renal diet with fluid restrictions.    Anice Paganini, PA-C 05/17/2020, 8:17 AM  [facility] Pager: [phone]

## 2020-05-17 NOTE — Progress Notes (Signed)
PROGRESS NOTE    [facility]  XBJ:[id number] DOB: 11/24/1941 DOA: 05/14/2020 PCP: [name], NP  Brief [place] a 78/M with history of ESRD on hemodialysis MTTS, CAD, chronic systolic CHF, EF of 30%, paroxysmal atrial fibrillation on Coumadin, HIV, presented to the emergency room with abdominal pain, vomiting, on further evaluation noted to have LFTs in the 500s and T bili of 3.1, RUQ [place]: gall bladder wall thickening and sludge -MRCP noted sludge and no filling defect -General surgery following  Assessment & Plan:  Abd pain/vomiting, gallbladder sludge Abnormal LFTS -Clinically suspect he passed a stone -LFTs improving, symptoms have resolved and exam is benign -MRCP 6/12 - for filling defect, sludge present -Appreciate cardiology input, general surgery following, await decision regarding laparoscopic cholecystectomy  P.Afib -rate controlled, hold coumadin pending further workup -In sinus rhythm, continue amiodarone  Chronic systolic and diastolic CHF Coronary artery disease -EF of 20%, volume managed with dialysis -Not on cardiac meds due to chronic hypotension -Last cath 02/2018 with nonobstructive CAD -Continue midodrine  ESRD on hemodialysis -On dialysis Monday Tuesday Thursday Saturday -Nephrology consulted, underwent dialysis overnight, has a right IJ HD catheter -HD today  Macrocytic anemia: Chronic. -Hemoglobin at baseline, monitor  History of HIV: CD4 T-cell absolute count 406 on 01/21/20. -ContinueBiktarvy   DVT prophylaxis: Holding Coumadin Code Status: Full code Family Communication: Discussed with patient in detail Disposition Plan:  Status is: Inpatient, awaiting decision regarding cholecystectomy  Dispo: The patient is from: Home              Anticipated d/c is to: Home              Anticipated d/c date is: To be determined, surgery considering lap chole              Patient currently is not medically  stable to d/c.   Consultants:   General surgery, cardiology   Procedures:   Antimicrobials:    Subjective: -No events overnight, denies any abdominal pain nausea or vomiting  Objective: Vitals:   05/17/20 1000 05/17/20 1030 05/17/20 1100 05/17/20 1113  BP: 95/65 104/68 101/65 106/60  Pulse: 78 72 76 72  Resp:      Temp:    98.3 F (36.8 C)  TempSrc:    Oral  SpO2:      Weight:    81 kg  Height:        Intake/Output Summary (Last 24 hours) at 05/17/2020 1210 Last data filed at 05/17/2020 0846 Gross per 24 hour  Intake 440 ml  Output 0 ml  Net 440 ml   Filed Weights   05/16/20 0645 05/17/20 0659 05/17/20 1113  Weight: 83.2 kg 82.5 kg 81 kg    Examination:  General exam: Elderly male laying in bed, seen on dialysis, AAOx3, no distress HEENT: No JVD CVS: S1-S2, regular rate rhythm Lungs: Diminished breath sounds at both bases Abdomen: Soft, nontender, nondistended, bowel sounds present Extremities: No edema Skin: No rashes on exposed skin, right IJ HD catheter Psychiatry:  Mood & affect appropriate.     Data Reviewed:   CBC: Recent Labs  Lab 05/14/20 1651 05/15/20 0452 05/16/20 0724 05/17/20 0609 05/17/20 0716  WBC 7.2 5.0 4.5 5.7 6.2  HGB 10.5* 9.6* 9.4* 9.4* 9.3*  HCT 32.9* 29.7* 28.3* 28.4* 29.2*  MCV 110.4* 108.4* 107.2* 108.8* 110.2*  PLT 209 183 153 170 865   Basic Metabolic Panel: Recent Labs  Lab 05/14/20 1651 05/15/20 0452 05/16/20 0724 05/17/20 0609 05/17/20  0715  NA 138 138 137 137 137  K 5.2* 5.3* 3.8 4.8 4.4  CL 98 98 99 99 98  CO2 26 26 26 26 25   GLUCOSE 119* 88 77 96 103*  BUN 31* 39* 23 38* 38*  CREATININE 8.38* 9.62* 5.96* 9.20* 9.14*  CALCIUM 9.8 9.4 8.0* 8.9 9.0  PHOS  --   --   --   --  4.7*   GFR: Estimated Creatinine Clearance: 7.8 mL/min (A) (by C-G formula based on SCr of 9.14 mg/dL (H)). Liver Function Tests: Recent Labs  Lab 05/14/20 1651 05/15/20 0452 05/16/20 0724 05/17/20 0609 05/17/20 0715  AST  501* 291* 144* 79*  --   ALT 489* 391* 279* 206*  --   ALKPHOS 311* 341* 341* 297*  --   BILITOT 3.1* 3.4* 1.5* 1.1  --   PROT 7.4 6.7 6.4* 6.2*  --   ALBUMIN 3.9 3.6 3.2* 3.2* 3.3*   Recent Labs  Lab 05/14/20 1651  LIPASE 37   No results for input(s): AMMONIA in the last 168 hours. Coagulation Profile: Recent Labs  Lab 05/15/20 0452  INR 1.2   Cardiac Enzymes: No results for input(s): CKTOTAL, CKMB, CKMBINDEX, TROPONINI in the last 168 hours. BNP (last 3 results) No results for input(s): PROBNP in the last 8760 hours. HbA1C: No results for input(s): HGBA1C in the last 72 hours. CBG: No results for input(s): GLUCAP in the last 168 hours. Lipid Profile: No results for input(s): CHOL, HDL, LDLCALC, TRIG, CHOLHDL, LDLDIRECT in the last 72 hours. Thyroid Function Tests: No results for input(s): TSH, T4TOTAL, FREET4, T3FREE, THYROIDAB in the last 72 hours. Anemia Panel: No results for input(s): VITAMINB12, FOLATE, FERRITIN, TIBC, IRON, RETICCTPCT in the last 72 hours. Urine analysis:    Component Value Date/Time   COLORURINE YELLOW 02/17/2018 0540   APPEARANCEUR CLOUDY (A) 02/17/2018 0540   LABSPEC 1.010 02/17/2018 0540   PHURINE 5.0 02/17/2018 0540   GLUCOSEU NEGATIVE 02/17/2018 0540   HGBUR LARGE (A) 02/17/2018 0540   BILIRUBINUR NEGATIVE 02/17/2018 0540   BILIRUBINUR negative 04/16/2013 1447   KETONESUR NEGATIVE 02/17/2018 0540   PROTEINUR 100 (A) 02/17/2018 0540   UROBILINOGEN 0.2 05/11/2014 1352   NITRITE NEGATIVE 02/17/2018 0540   LEUKOCYTESUR NEGATIVE 02/17/2018 0540   Sepsis Labs: @LABRCNTIP (procalcitonin:4,lacticidven:4)  ) Recent Results (from the past 240 hour(s))  SARS Coronavirus 2 by RT PCR (hospital order, performed in [place] hospital lab) Nasopharyngeal Nasopharyngeal Swab     Status: None   Collection Time: 05/15/20  2:11 AM   Specimen: Nasopharyngeal Swab  Result Value Ref Range Status   SARS Coronavirus 2 NEGATIVE NEGATIVE Final     Comment: (NOTE) SARS-CoV-2 target nucleic acids are NOT DETECTED.  The SARS-CoV-2 RNA is generally detectable in upper and lower respiratory specimens during the acute phase of infection. The lowest concentration of SARS-CoV-2 viral copies this assay can detect is 250 copies / mL. A negative result does not preclude SARS-CoV-2 infection and should not be used as the sole basis for treatment or other patient management decisions.  A negative result may occur with improper specimen collection / handling, submission of specimen other than nasopharyngeal swab, presence of viral mutation(s) within the areas targeted by this assay, and inadequate number of viral copies (<250 copies / mL). A negative result must be combined with clinical observations, patient history, and epidemiological information.  Fact Sheet for Patients:   StrictlyIdeas.no  Fact Sheet for Healthcare Providers: BankingDealers.co.za  This test is not yet  approved or  cleared by the [place] and has been authorized for detection and/or diagnosis of SARS-CoV-2 by FDA under an Emergency Use Authorization (EUA).  This EUA will remain in effect (meaning this test can be used) for the duration of the COVID-19 declaration under Section 564(b)(1) of the Act, 21 U.S.C. section 360bbb-3(b)(1), unless the authorization is terminated or revoked sooner.  Performed at [facility], [place] [address] [place], [place] [address]   Surgical pcr screen     Status: None   Collection Time: 05/16/20  8:22 PM   Specimen: Nasal Mucosa; Nasal Swab  Result Value Ref Range Status   MRSA, PCR NEGATIVE NEGATIVE Final   Staphylococcus aureus NEGATIVE NEGATIVE Final    Comment: (NOTE) The Xpert SA Assay (FDA approved for NASAL specimens in patients 63 years of age and older), is one component of a comprehensive surveillance program. It is not intended to diagnose infection nor  to guide or monitor treatment. Performed at [facility], [place] [address] [place], [place] [address]          Radiology Studies: MR ABDOMEN MRCP W WO CONTAST  Result Date: 05/15/2020 CLINICAL DATA:  Abdominal pain, unspecified abdominal pain EXAM: MRI ABDOMEN WITHOUT AND WITH CONTRAST (INCLUDING MRCP) TECHNIQUE: Multiplanar multisequence MR imaging of the abdomen was performed both before and after the administration of intravenous contrast. Heavily T2-weighted images of the biliary and pancreatic ducts were obtained, and three-dimensional MRCP images were rendered by post processing. CONTRAST:  84mL GADAVIST GADOBUTROL 1 MMOL/ML IV SOLN COMPARISON:  Abdominal sonogram of May 15, 2020 FINDINGS: Lower chest: Marked cardiomegaly without pericardial effusion. No pleural effusion. Limited assessment of the lung bases. Hepatobiliary: Marked hepatic iron deposition without focal, suspicious hepatic lesion. Small amount of pericholecystic fluid. Minimal wall thickening with some uniform enhancement that is not significantly changed compared to a CT from 01/25/2020. No signs of cholelithiasis, sludge layering in the gallbladder. No choledocholithiasis. No biliary ductal dilation. Pancreas:  Pancreas is normal. Spleen: Spleen normal size and contour with splenic iron deposition as well. Adrenals/Urinary Tract: Adrenal glands with profound loss of signal on in phase as compared to out of phase T1 weighted gradient echo imaging. Bilateral cysts in the kidneys with simple and hemorrhagic features. No concerning renal lesion. Signs of cortical atrophy of the bilateral kidneys symmetric and bilateral. Stomach/Bowel: No acute gastrointestinal process to the extent visualized. Study not protocol for bowel evaluation. Vascular/Lymphatic: Vascular structures in the abdomen are patent. No adenopathy. Other:  No ascites.  Small pericholecystic fluid. Musculoskeletal: Profound iron deposition in marrow spaces.  IMPRESSION: 1. Gallbladder wall thickening and pericholecystic fluid similar to the recent ultrasound evaluation. Gallbladder wall enhancement is similar accounting for differences in technique when compared to the CT study from February of 2021. Findings remain equivocal for acute cholecystitis, question remains given small amount of pericholecystic fluid detected on current exam. Correlation with HIDA scan may be helpful. 2. No filling defect in the common bile duct. Sludge in the gallbladder. It is possible that the constellation of findings could be related to recently passed calculus or hepatic dysfunction. 3. Signs of iron overload. This could be seen in the setting of treatment of anemia with Feraheme. There are no signs of recent Feraheme administration I 80 increased signal in the vasculature on precontrast imaging. 4. Iron deposition in the adrenal glands can also be seen with this medication/treatment. Correlation with adrenal function may be helpful. 5. Bilateral renal cysts. Electronically Signed  By: Zetta Bills M.D.   On: 05/15/2020 19:25        Scheduled Meds: . allopurinol  100 mg Oral Once per day on Mon Tue Thu Sat  . amiodarone  200 mg Oral Q1200  . bictegravir-emtricitabine-tenofovir AF  1 tablet Oral Q1200  . brimonidine  1 drop Right Eye BID  . [START ON 05/18/2020] calcitRIOL  0.5 mcg Oral Q T,Th,Sa-HD  . Chlorhexidine Gluconate Cloth  6 each Topical Q0600  . darbepoetin (ARANESP) injection - DIALYSIS  40 mcg Intravenous Q Mon-HD  . heparin sodium (porcine)      . latanoprost  1 drop Right Eye QHS  . loratadine  10 mg Oral Q1200  . midodrine      . midodrine  10 mg Oral Once per day on Mon Tue Thu Sat  . midodrine  20 mg Oral Once per day on Mon Tue Thu Sat  . midodrine  5 mg Oral Once per day on Sun Wed Fri  . montelukast  10 mg Oral QHS  . multivitamin  1 tablet Oral QHS  . polyethylene glycol  17 g Oral QPM  . sucroferric oxyhydroxide  500 mg Oral TID WC    Continuous Infusions:   LOS: 1 day    Time spent: 77min    Domenic Polite, MD [facility] 05/17/2020, 12:10 PM

## 2020-05-17 NOTE — Progress Notes (Signed)
Subjective: Patient with no further pain.  Just got done with HD.  He does HD 4x/week.  No further nausea.  ROS: See above, otherwise other systems negative  Objective: Vital signs in last 24 hours: Temp:  [97.9 F (36.6 C)-98.7 F (37.1 C)] 98.3 F (36.8 C) (06/14 1219) Pulse Rate:  [70-82] 71 (06/14 1219) Resp:  [17-18] 18 (06/14 1219) BP: (95-139)/(60-92) 117/68 (06/14 1219) SpO2:  [97 %-100 %] 99 % (06/14 1219) Weight:  [81 kg-82.5 kg] 81 kg (06/14 1113) Last BM Date: 05/16/20  Intake/Output from previous day: 06/13 0701 - 06/14 0700 In: 680 [P.O.:680] Out: 0  Intake/Output this shift: Total I/O In: 0  Out: 1451 [Other:1451]  PE: Abd: soft, NT, ND, +BS  Lab Results:  Recent Labs    05/17/20 0609 05/17/20 0716  WBC 5.7 6.2  HGB 9.4* 9.3*  HCT 28.4* 29.2*  PLT 170 199   BMET Recent Labs    05/17/20 0609 05/17/20 0715  NA 137 137  K 4.8 4.4  CL 99 98  CO2 26 25  GLUCOSE 96 103*  BUN 38* 38*  CREATININE 9.20* 9.14*  CALCIUM 8.9 9.0   PT/INR Recent Labs    05/15/20 0452  LABPROT 14.5  INR 1.2   CMP     Component Value Date/Time   NA 137 05/17/2020 0715   NA 142 04/24/2018 1641   K 4.4 05/17/2020 0715   CL 98 05/17/2020 0715   CO2 25 05/17/2020 0715   GLUCOSE 103 (H) 05/17/2020 0715   BUN 38 (H) 05/17/2020 0715   BUN 31 (H) 04/24/2018 1641   CREATININE 9.14 (H) 05/17/2020 0715   CREATININE 7.71 (H) 03/19/2019 1140   CALCIUM 9.0 05/17/2020 0715   CALCIUM 8.6 03/14/2016 0925   PROT 6.2 (L) 05/17/2020 0609   PROT 7.9 04/24/2018 1641   ALBUMIN 3.3 (L) 05/17/2020 0715   ALBUMIN 5.0 (H) 04/24/2018 1641   AST 79 (H) 05/17/2020 0609   ALT 206 (H) 05/17/2020 0609   ALKPHOS 297 (H) 05/17/2020 0609   BILITOT 1.1 05/17/2020 0609   BILITOT 0.4 04/24/2018 1641   GFRNONAA 5 (L) 05/17/2020 0715   GFRNONAA 6 (L) 03/19/2019 1140   GFRAA 6 (L) 05/17/2020 0715   GFRAA 7 (L) 03/19/2019 1140   Lipase     Component Value Date/Time   LIPASE  37 05/14/2020 1651       Studies/Results: MR ABDOMEN MRCP W WO CONTAST  Result Date: 05/15/2020 CLINICAL DATA:  Abdominal pain, unspecified abdominal pain EXAM: MRI ABDOMEN WITHOUT AND WITH CONTRAST (INCLUDING MRCP) TECHNIQUE: Multiplanar multisequence MR imaging of the abdomen was performed both before and after the administration of intravenous contrast. Heavily T2-weighted images of the biliary and pancreatic ducts were obtained, and three-dimensional MRCP images were rendered by post processing. CONTRAST:  63mL GADAVIST GADOBUTROL 1 MMOL/ML IV SOLN COMPARISON:  Abdominal sonogram of May 15, 2020 FINDINGS: Lower chest: Marked cardiomegaly without pericardial effusion. No pleural effusion. Limited assessment of the lung bases. Hepatobiliary: Marked hepatic iron deposition without focal, suspicious hepatic lesion. Small amount of pericholecystic fluid. Minimal wall thickening with some uniform enhancement that is not significantly changed compared to a CT from 01/25/2020. No signs of cholelithiasis, sludge layering in the gallbladder. No choledocholithiasis. No biliary ductal dilation. Pancreas:  Pancreas is normal. Spleen: Spleen normal size and contour with splenic iron deposition as well. Adrenals/Urinary Tract: Adrenal glands with profound loss of signal on in phase as compared to out  of phase T1 weighted gradient echo imaging. Bilateral cysts in the kidneys with simple and hemorrhagic features. No concerning renal lesion. Signs of cortical atrophy of the bilateral kidneys symmetric and bilateral. Stomach/Bowel: No acute gastrointestinal process to the extent visualized. Study not protocol for bowel evaluation. Vascular/Lymphatic: Vascular structures in the abdomen are patent. No adenopathy. Other:  No ascites.  Small pericholecystic fluid. Musculoskeletal: Profound iron deposition in marrow spaces. IMPRESSION: 1. Gallbladder wall thickening and pericholecystic fluid similar to the recent ultrasound  evaluation. Gallbladder wall enhancement is similar accounting for differences in technique when compared to the CT study from February of 2021. Findings remain equivocal for acute cholecystitis, question remains given small amount of pericholecystic fluid detected on current exam. Correlation with HIDA scan may be helpful. 2. No filling defect in the common bile duct. Sludge in the gallbladder. It is possible that the constellation of findings could be related to recently passed calculus or hepatic dysfunction. 3. Signs of iron overload. This could be seen in the setting of treatment of anemia with Feraheme. There are no signs of recent Feraheme administration I 80 increased signal in the vasculature on precontrast imaging. 4. Iron deposition in the adrenal glands can also be seen with this medication/treatment. Correlation with adrenal function may be helpful. 5. Bilateral renal cysts. Electronically Signed   By: Zetta Bills M.D.   On: 05/15/2020 19:25    Anti-infectives: Anti-infectives (From admission, onward)   Start     Dose/Rate Route Frequency Ordered Stop   05/15/20 1200  bictegravir-emtricitabine-tenofovir AF ([name]) 50-200-25 MG per tablet 1 tablet     Discontinue     1 tablet Oral Daily 05/15/20 0202         Assessment/Plan ESRD on HD 4x/week PAF CAD CHF - EF 20% HIV  Abnormal LFTs/gallbladder sludge The patient has been seen by cardiology and is deemed high risk as expected given his history.  The patient tells me today that he no longer has abdominal pain.  The suspicion is that he may have past a gallstone.  He does have sludge in his gallbladder still which can put him at risk for recurrence of his symptoms.  We discussed proceeding with a lap chole and discussed the high risk nature of that procedure along with possible drain if we felt he has cholecystitis, or ultimately maybe just an ERCP with sphincterotomy to insure if something else passes into this CBD that it would  pass on its own.  He is unsure how he wants to proceed.  I told him to think about it and let Dr. Donne Hazel know when he comes to see him.   FEN - CLD VTE - ok for chemical prophylaxis from our standpoing ID - none currently   LOS: 1 day    Henreitta Cea , [facility] 05/17/2020, 1:35 PM Please see Amion for pager number during day hours 7:00am-4:30pm or 7:00am -11:30am on weekends

## 2020-05-17 NOTE — Consult Note (Signed)
[facility] Consult: 2:17 PM 05/17/2020  LOS: 1 day    Referring Provider: Dr Broadus John  Primary Care Physician:  [name], NP Primary Gastroenterologist:  Dr Carlean Purl     Reason for Consultation:  Abdominal pain and bilious emesis.     HPI: David [name] is a 79 y.o. male.  PMH Non-ischemic CM/CHF, NY class 4 heart failure, EF 20 - 25% by echo 03/04/19.  Chronic Coumadin for Parox Afib.  PEA arrest 2019.  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  HD associated hypotension.   Anemia chronic dz, on Mircera.  HIV + (CD4 410 in Jan 2020).  Anemia of chronic disease w macrocytosis.  Covid 19 + Jan 2021, treated w Remdes/dexamethasone.   .     01/2011 Colonoscopy.  routine risk screening. 3 small polyps (path benign colonic mucosa and benign lymphoid polyp). O/w normal study.  Repeat colonoscopy not recommended (letter sent to pt 04/2016)   05/2014 EGD. Dr Teena Irani for N/V, wt loss.  Possible mild gastritis, nothing to explain sxs.    05/2014 Esophagram.  For dysphagia and vomiting. Normal.   05/2014 GES. Abnormal with 74% retention at 60 min and 54% retention at 120 min (normal retention less than 30% at a 120 min).  03/14/2019 colonoscopy.  For RLQ abdominal pain, abnormal CT.  Severe ischemia involving entire cecum and IC valve.  Risk factors for this less common site of ischemic colitis include cardiomyopathy and dialysis related volume shifts. Path confirmed ischemia w necrosis.     OV w Dr Carlean Purl 04/12/20 re intermittent solid food dysphagia.  " ... occurring in a man with numerous comorbidities... start with a barium swallow.  I think he may have impingement of his esophagus from dilated thoracic arteries.  If that is unrevealing may consider EGD but would prefer focused therapy if  I see a stricture etc.  He would have to have his procedure at the hospital due to his increased comorbidities and higher risk of problems related to sedation and periprocedural issues.  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 04/21/20 Ba Esophagram: Esophageal dysmotility, likely presbyesophagus.  Delayed passage of barium tablet at lower esophagus, likely due to dysmotility.  ++++++++++++++++++++++++++++++++++++++++++++++++++++++  Presented to ED on 05/14/2020 with several days epigastric, RUQ abdominal pain, green/bilious nausea/vomiting.  No pain radiation to back.  sxs resolved within 24 hours.  No sweats, chills, rigors.  No previous similar sxs.  No recent greasy, fatty foods T bili 3.1 >> 1.1.  Previously normal Alk phos 311 >> 297. Priors 120s to 140s AST/ALT 501/489 >> 79/206.  Previously normal.    Lipase 37.   WBCs normal.   Hgb 9.3, stable from prvious.  MCV110 Acute Hepatitis ABC serologies non-reactive  Abdominal ultrasound: Nonspecific GB wall thickening, sonographic Murphy not evident.  MRCP shows GB wall thickening, pericholecystic fluid similar to ultrasound.  GB wall enhancement equivocal for acute cholecystitis.  GB sludge.  No CBD filling defects.  Signs of iron overload with deposition in adrenal glands, bone marrow.  Surgery is following.  Abdominal pain, N/V resolved, LFTs improved. Tolerating solids as of yesterday.  Deemed too high risk for cholecystectomy.  Dr. Donne Hazel seeking GI input as to need for ERCP with sphincterotomy.    Drinks moderate amount of alcohol about once a month.   Past Medical History:  Diagnosis Date  . 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 Acute on chronic systolic and diastolic heart failure, NYHA class 4 ([place])   . Anemia, iron deficiency 11/15/2011  .  Arthritis    "hands, right knee, feet" (02/21/2018)  . Atrial fibrillation ([place])   . Cardiac arrest ([place]) 02/17/2018  . CHF (congestive heart failure) ([place])   . Chronic combined systolic and diastolic heart failure, NYHA class 3 ([place]) 06/2010   felt to be secondary to hypertensive cardiomyopathy  . Chronic lower back pain   . CKD (chronic kidney disease) stage V requiring chronic dialysis ([place])   . COVID-19 12/2019  . ESRD on dialysis [facility])    started 02/2018 [place]  . GERD (gastroesophageal reflux disease)   . Glaucoma    "I had surgery and dont have it any more"  . Gout    daily RX (02/21/2018)  . Heart murmur    "mild" per pt  . Hepatitis    years ago  . HIV (human immunodeficiency virus infection) ([place])   . HIV infection ([place])   . Hyperlipidemia   . Hypertension   . Nonischemic cardiomyopathy ([place])   . Pneumonia 11/2017  . Prostate cancer ([place])    hx of prostate; s/p radioactive seed implant 10/2009 Dr Janice Norrie  . Sinus bradycardia   . Sleep apnea    does not use a cpap  . Stroke [facility])    "mini stroke" years ago  . Wears glasses     Past Surgical History:  Procedure Laterality Date  . AV FISTULA PLACEMENT Right 10/13/2016   Procedure: ARTERIOVENOUS (AV) FISTULA CREATION;  Surgeon: [name], MD;  Location: [place];  Service: Vascular;  Laterality: Right;  . AV FISTULA PLACEMENT Left 02/25/2018   Procedure: INSERTION OF ARTERIOVENOUS (AV) GORE-TEX GRAFT LEFT UPPER ARM;  Surgeon: Angelia Mould, MD;  Location: [place];  Service: Vascular;  Laterality: Left;  . AV FISTULA PLACEMENT Right 08/07/2018   Procedure: Creation of right arm brachiocephalic Fistula;  Surgeon: Waynetta Sandy, MD;  Location: [place];  Service: Vascular;  Laterality: Right;  . AV FISTULA PLACEMENT Left 11/10/2019   Procedure: INSERTION OF ARTERIOVENOUS (AV) GORE-TEX GRAFT THIGH;  Surgeon: Elam Dutch, MD;  Location: [place];  Service: Vascular;  Laterality:  Left;  . AV FISTULA PLACEMENT Right 12/02/2019   Procedure: INSERTION OF ARTERIOVENOUS (AV) GORE-TEX GRAFT RIGHT  THIGH;  Surgeon: Waynetta Sandy, MD;  Location: [place];  Service: Vascular;  Laterality: Right;  . BASCILIC VEIN TRANSPOSITION Left 06/24/2015   Procedure: BASILIC VEIN TRANSPOSITION;  Surgeon: Mal Misty, MD;  Location: [place];  Service: Vascular;  Laterality: Left;  . BIOPSY  03/14/2019   Procedure: BIOPSY;  Surgeon: Irene Shipper, MD;  Location: [place];  Service: Endoscopy;;  . CATARACT EXTRACTION, BILATERAL Bilateral   . COLONOSCOPY WITH PROPOFOL N/A 03/14/2019   Procedure: COLONOSCOPY WITH PROPOFOL;  Surgeon: Irene Shipper, MD;  Location: [facility] ENDOSCOPY;  Service: Endoscopy;  Laterality: N/A;  .  ESOPHAGOGASTRODUODENOSCOPY (EGD) WITH PROPOFOL N/A 05/05/2014   Procedure: ESOPHAGOGASTRODUODENOSCOPY (EGD) WITH PROPOFOL;  Surgeon: Missy Sabins, MD;  Location: [facility] ENDOSCOPY;  Service: Endoscopy;  Laterality: N/A;  . EYE SURGERY Bilateral    cataract surgery   . GLAUCOMA SURGERY Bilateral   . INSERTION OF ARTERIOVENOUS (AV) ARTEGRAFT ARM  10/09/2018   Procedure: INSERTION OF ARTERIOVENOUS (AV) 42m x 41cm ARTEGRAFT LEFT UPPER ARM;  Surgeon: CWaynetta Sandy MD;  [place]  Service: Vascular;;  . INSERTION OF DIALYSIS CATHETER Right 07/14/2019   Procedure: Insertion Of Dialysis Catheter;  Surgeon: FElam Dutch MD;  [facility];  Service: Vascular;  Laterality: Right;  placed right Internal Jugular  . INSERTION PROSTATE RADIATION SEED    . IR FLUORO GUIDE CV LINE RIGHT  02/18/2018  . IR REMOVAL TUN CV CATH W/O FL  12/06/2018  . IR THROMBECTOMY AV FISTULA W/THROMBOLYSIS/PTA INC/SHUNT/IMG RIGHT Right 01/26/2020  . IR THROMBECTOMY AV FISTULA W/THROMBOLYSIS/PTA INC/SHUNT/IMG RIGHT Right 02/24/2020  . IR THROMBECTOMY AV FISTULA W/THROMBOLYSIS/PTA INC/SHUNT/IMG RIGHT Right 04/07/2020  . IR [place] VASC ACCESS RIGHT  02/18/2018  . IR [place] VASC ACCESS RIGHT   01/26/2020  . IR [place] VASC ACCESS RIGHT  04/07/2020  . LEFT HEART CATH AND CORONARY ANGIOGRAPHY N/A 02/20/2018   Procedure: LEFT HEART CATH AND CORONARY ANGIOGRAPHY;  Surgeon: VJettie Booze MD;  [facility] [place] [facility];;; 50% ostial OM2, 25% mLAD.  .Marland KitchenLEFT HEART CATH AND CORONARY ANGIOGRAPHY  2004   mildly depressed LV systolic fx EF 532%,RJJOACcoronaries/abdominal aorta/renal arteries.  . LOWER EXTREMITY ANGIOGRAM Left 11/17/2019   Procedure: Lower Extremity Fistulogram;  Surgeon: CMarty Heck MD;  [place]  Service: Vascular;  Laterality: Left;  . NM [place] 02/21/2010   No ischemia or infarction.  EF 27%.  .Marland KitchenRADIOACTIVE SEED IMPLANT  2010   prostate cancer  . REVISION OF ARTERIOVENOUS GORETEX GRAFT Right 07/11/2019   Procedure: REVISION OF ARTERIOVENOUS GORETEX GRAFT RIGHT ARM WITH ARTEGRAFT;  Surgeon: BSerafina Mitchell MD;  [place]  Service: Vascular;  Laterality: Right;  . SHUNTOGRAM Right 07/14/2019   Procedure: Shuntogram;  Surgeon: FElam Dutch MD;  [place]  Service: Vascular;  Laterality: Right;  . THROMBECTOMY AND REVISION OF ARTERIOVENTOUS (AV) GORETEX  GRAFT Right 07/14/2019   Procedure: THROMBECTOMY AND REVISION OF ARTERIOVENTOUS (AV) GORETEX  GRAFT;  Surgeon: FElam Dutch MD;  [place]  Service: Vascular;  Laterality: Right;  . THROMBECTOMY W/ EMBOLECTOMY Left 02/26/2018   Procedure: THROMBECTOMY ARTERIOVENOUS GRAFT;  Surgeon: DAngelia Mould MD;  [place]  Service: Vascular;  Laterality: Left;  . THROMBECTOMY W/ EMBOLECTOMY Left 11/17/2019   Procedure: THROMBECTOMY ARTERIOVENOUS GRAFT LEFT THIGH;  Surgeon: CMarty Heck MD;  [place]  Service: Vascular;  Laterality: Left;  . TRANSTHORACIC ECHOCARDIOGRAM  02/2012   ([facility] EF 45-50% with mild global HK.  Mild LVH,LA mod. dilated,mild-mod. 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 AI.  .Marland Kitchen TRANSTHORACIC ECHOCARDIOGRAM  9/'18; 3/'19   a) Severely reduced EF.  25 from 30%.  GR 1 DD with diffuse ecchymosis. Mild AS & AI.  Mild LA/RA dilation; b) (post PEA arest):   Moderately dilated LV with moderate concentric virtually.  EF 15 to 20%.  GR 1 DD.  Paradoxical septal motion. Mild AI.  Mild LA dilation.  Poorly visualized RV.  .Marland KitchenTRANSTHORACIC ECHOCARDIOGRAM  02/2019   EF  20-25%.  Unable to assess diastolic function (A. Fib).  No LV apical thrombus.  Mild AI.  Mildly reduced RV function, however poorly visualized.  Marland Kitchen ULTRASOUND GUIDANCE FOR VASCULAR ACCESS  02/20/2018   Procedure: Ultrasound Guidance For Vascular Access;  Surgeon: Jettie Booze, MD;  Location: [place] CV LAB;  Service: Cardiovascular;;  . UPPER EXTREMITY VENOGRAPHY N/A 07/08/2018   Procedure: UPPER EXTREMITY VENOGRAPHY;  Surgeon: Waynetta Sandy, MD;  Location: [place] CV LAB;  Service: Cardiovascular;  Laterality: N/A;  Bilateral    Prior to Admission medications   Medication Sig Start Date End Date Taking? Authorizing Provider  acetaminophen (TYLENOL) 500 MG tablet Take 1,000 mg by mouth every 6 (six) hours as needed for moderate pain.   Yes [provider]  allopurinol (ZYLOPRIM) 100 MG tablet Take 1 tablet (100 mg total) by mouth See admin instructions. Take one tablet (100 mg) by mouth Mondays, Tuesday, Thursday, Saturday after dialysis 03/25/20  Yes Croitoru, Mihai, MD  amiodarone (PACERONE) 200 MG tablet Take 1 tablet (200 mg total) by mouth daily. Patient taking differently: Take 200 mg by mouth daily at 12 noon.  05/12/19  Yes Croitoru, Mihai, MD  bictegravir-emtricitabine-tenofovir AF ([name]) 50-200-25 MG TABS tablet Take 1 tablet by mouth daily. Patient taking differently: Take 1 tablet by mouth daily at 12 noon.  09/29/19  Yes Tommy Medal, Lavell Islam, MD  brimonidine (ALPHAGAN) 0.15 % ophthalmic solution Place 1 drop into the right eye 2 (two) times daily.  11/15/17  Yes [provider]  latanoprost (XALATAN) 0.005 % ophthalmic solution Place 1 drop into the right eye at bedtime.    Yes [provider]  loratadine (CLARITIN) 10 MG tablet Take 10 mg by mouth daily at 12 noon.    Yes [provider]  midodrine (PROAMATINE) 10 MG tablet Take 1 tablet (10 mg total) by mouth as directed. Twice a day on the day of HD Tuesday, Thursday, Saturday (1 in AM and 1 at [place]). Take 1/2 tablet (2m) a day on non HD days Patient taking differently: Take 10 mg by mouth See admin instructions. Take 20 mg in the morning of dialysis days and 10 mg half way through treatment on dialysis days (Mon, Tues, [place] and Sat) 04/22/19  Yes HLeonie Man MD  montelukast (SINGULAIR) 10 MG tablet Take 1 tablet (10 mg total) by mouth at bedtime. NEEDS OV/FOLLOW UP 03/23/20  Yes ODebbrah Alar NP  Multiple Vitamin (MULTIVITAMIN WITH MINERALS) TABS tablet Take 1 tablet by mouth every evening.    Yes [provider]  polyethylene glycol (MIRALAX / GLYCOLAX) 17 g packet Take 17 g by mouth every evening.   Yes [provider]  sucroferric oxyhydroxide (VELPHORO) 500 MG chewable tablet Chew 500 mg by mouth 3 (three) times daily with meals.   Yes [provider]  traMADol (ULTRAM) 50 MG tablet Take 1 tablet (50 mg total) by mouth every 6 (six) hours as needed. Patient taking differently: Take 50 mg by mouth every 6 (six) hours as needed for moderate pain.  01/27/20  Yes JDomenic Polite MD  warfarin (COUMADIN) 5 MG tablet Take 571mdaily for next 3days and then check INR 2/25, goal INR 2-3 Patient taking differently: Take 5 mg by mouth See admin instructions. On Monday, Tuesday, Thursday and Saturday 05/05/20  Yes HaLeonie ManMD    Scheduled Meds: . allopurinol  100 mg Oral Once per day on Mon Tue Thu Sat  . amiodarone  200 mg Oral Q1200  . bictegravir-emtricitabine-tenofovir AF  1 tablet Oral Q1200  . brimonidine  1 drop Right Eye BID  . [START  ON 05/18/2020] calcitRIOL  0.5 mcg Oral Q T,Th,Sa-HD  . Chlorhexidine Gluconate Cloth  6 each Topical Q0600  . darbepoetin (ARANESP) injection - DIALYSIS  40 mcg Intravenous Q Mon-HD  . heparin sodium (porcine)      . latanoprost  1 drop Right Eye QHS  . loratadine  10 mg Oral Q1200  . midodrine      . midodrine  10 mg Oral Once per day on Mon Tue Thu Sat  . midodrine  20 mg Oral Once per day on Mon Tue Thu Sat  . midodrine  5 mg Oral Once per day on Sun Wed Fri  . montelukast  10 mg Oral QHS  . multivitamin  1 tablet Oral QHS  . polyethylene glycol  17 g Oral QPM  . sucroferric oxyhydroxide  500 mg Oral TID WC   Infusions:  PRN Meds: ondansetron **OR** ondansetron (ZOFRAN) IV, traMADol   Allergies as of 05/14/2020 - Review Complete 05/14/2020  Allergen Reaction Noted  . Dextromethorphan-guaifenesin Other (See Comments) 08/29/2019  . Tocotrienols Other (See Comments) 08/29/2019  . Losartan potassium Other (See Comments) 07/23/2013    Family History  Problem Relation Age of Onset  . Hypertension Mother   . Thyroid disease Mother   . Cholelithiasis Daughter   . Cholelithiasis Son   . Hypertension Maternal Grandmother   . Diabetes Maternal Grandmother   . Heart attack Neg Hx   . Hyperlipidemia Neg Hx     Social History   Socioeconomic History  . Marital status: Married    Spouse name: Not on file  . Number of children: 2  . Years of education: 43  . Highest education level: Not on file  Occupational History  . 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 Smoking status: Former Smoker    Packs/day: 1.00    Years: 30.00    Pack years: 30.00    Types: Cigarettes    Quit date: 2006    Years since quitting: 15.4  . Smokeless tobacco: Never Used  Vaping Use  . Vaping Use: Never used  Substance and Sexual Activity  . Alcohol use: Yes    Alcohol/week: 1.0 standard drink    Types: 1 Shots of liquor per week    Comment: occasional    . Drug use: Never  . Sexual activity: Not Currently    Comment: declined condoms 09/2019  Other Topics Concern  . 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   Social Determinants of Health   Financial Resource Strain:   . Difficulty of Paying Living Expenses:   Food Insecurity: No Food Insecurity  . Worried About Charity fundraiser in the Last Year: Never true  . Ran Out of Food in the Last Year: Never true  Transportation Needs: No Transportation Needs  . Lack of Transportation (Medical): No  . Lack of Transportation (Non-Medical): No  Physical Activity:   . Days of Exercise per Week:   . Minutes of Exercise per Session:  Stress:   . Feeling of Stress :   Social Connections:   . Frequency of Communication with Friends and Family:   . Frequency of Social Gatherings with Friends and Family:   . Attends Religious Services:   . Active Member of Clubs or Organizations:   . Attends Archivist Meetings:   Marland Kitchen Marital Status:   Intimate Partner Violence:   . Fear of Current or Ex-Partner:   . Emotionally Abused:   Marland Kitchen Physically Abused:   . Sexually Abused:     REVIEW OF SYSTEMS: Constitutional:  No weakness or fatigue ENT:  No nose bleeds Pulm:     No cough, no shortness of breath. CV:  No palpitations, no LE edema.  GU:  No hematuria, no frequency GI: See HPI.  Solid food dysphagia continues intermittently, has not accelerated. Heme: Denies excessive or unusual bleeding or bruising. Transfusions: Has received Feraheme on many occasions.  Received PRBCs x 2 in 12/2019 Neuro:  No headaches, no peripheral tingling or numbness At home uses either a cane or walker to ambulate within the house Derm:  No itching, no rash or sores.  Endocrine:  No sweats or chills.  No  polyuria or dysuria Immunization: Completed two-stage COVID-19 vaccination.    PHYSICAL EXAM: Vital signs in last 24 hours: Vitals:   05/17/20 1113 05/17/20 1219  BP: 106/60 117/68  Pulse: 72 71  Resp: 17 18  Temp: 98.3 F (36.8 C) 98.3 F (36.8 C)  SpO2: 98% 99%   Wt Readings from Last 3 Encounters:  05/17/20 81 kg  04/12/20 82.6 kg  04/07/20 81.6 kg    General: Pleasant, comfortable.  Looks better than expected given his extensive significant comorbidities. Head: No facial asymmetry or swelling.  No signs of head trauma. Eyes: No scleral icterus.  No conjunctival pallor.  EOMI Ears: Not HOH Nose: No discharge or congestion Mouth: Very few teeth remain.  Mucosa is moist, pink, clear.  Tongue is midline. Neck: No JVD, no masses, no thyromegaly. Lungs: Some coarse rales in the left lower lung, no labored breathing, no cough.  Dialysis catheter in the right upper chest Heart: RRR.  No MRG.  S1, S2 present Abdomen: Soft without tenderness.  Active bowel sounds.  No distention.  No masses, HSM, bruits, hernias, pulsatile masses..   Rectal: Deferred Musc/Skeltl: No gross joint deformities or contractures.  Overall weak, unable to sit forward in bed without significant assistance. Extremities: No lower extremity edema.  Feet are warm. Neurologic: Alert.  Appropriate.  Oriented x3.  No tremor. Skin: No rashes, sores, suspicious lesions. Nodes: No cervical adenopathy Psych: Calm, pleasant, cooperative.  Fluid speech.  Intake/Output from previous day: 06/13 0701 - 06/14 0700 In: 680 [P.O.:680] Out: 0  Intake/Output this shift: Total I/O In: 0  Out: 1451 [Other:1451]  LAB RESULTS: Recent Labs    05/16/20 0724 05/17/20 0609 05/17/20 0716  WBC 4.5 5.7 6.2  HGB 9.4* 9.4* 9.3*  HCT 28.3* 28.4* 29.2*  PLT 153 170 199   BMET Lab Results  Component Value Date   NA 137 05/17/2020   NA 137 05/17/2020   NA 137 05/16/2020   K 4.4 05/17/2020   K 4.8 05/17/2020   K 3.8  05/16/2020   CL 98 05/17/2020   CL 99 05/17/2020   CL 99 05/16/2020   CO2 25 05/17/2020   CO2 26 05/17/2020   CO2 26 05/16/2020   GLUCOSE 103 (H) 05/17/2020   GLUCOSE 96 05/17/2020   GLUCOSE  77 05/16/2020   BUN 38 (H) 05/17/2020   BUN 38 (H) 05/17/2020   BUN 23 05/16/2020   CREATININE 9.14 (H) 05/17/2020   CREATININE 9.20 (H) 05/17/2020   CREATININE 5.96 (H) 05/16/2020   CALCIUM 9.0 05/17/2020   CALCIUM 8.9 05/17/2020   CALCIUM 8.0 (L) 05/16/2020   LFT Recent Labs    05/15/20 0452 05/15/20 0452 05/16/20 0724 05/17/20 0609 05/17/20 0715  PROT 6.7  --  6.4* 6.2*  --   ALBUMIN 3.6   < > 3.2* 3.2* 3.3*  AST 291*  --  144* 79*  --   ALT 391*  --  279* 206*  --   ALKPHOS 341*  --  341* 297*  --   BILITOT 3.4*  --  1.5* 1.1  --    < > = values in this interval not displayed.   PT/INR Lab Results  Component Value Date   INR 1.2 05/15/2020   INR 1.9 (A) 04/22/2020   INR 4.2 (A) 02/26/2020   INR 4.2 (A) 02/26/2020   Hepatitis Panel Recent Labs    05/15/20 0048  HEPBSAG NON REACTIVE  HCVAB NON REACTIVE  HEPAIGM NON REACTIVE  HEPBIGM NON REACTIVE   C-Diff No components found for: CDIFF Lipase     Component Value Date/Time   LIPASE 37 05/14/2020 1651    Drugs of Abuse     Component Value Date/Time   LABOPIA NONE DETECTED 02/17/2018 0540   COCAINSCRNUR NONE DETECTED 02/17/2018 0540   LABBENZ NONE DETECTED 02/17/2018 0540   AMPHETMU NONE DETECTED 02/17/2018 0540   THCU NONE DETECTED 02/17/2018 0540   LABBARB NONE DETECTED 02/17/2018 0540     RADIOLOGY STUDIES: MR ABDOMEN MRCP W WO CONTAST  Result Date: 05/15/2020 CLINICAL DATA:  Abdominal pain, unspecified abdominal pain EXAM: MRI ABDOMEN WITHOUT AND WITH CONTRAST (INCLUDING MRCP) TECHNIQUE: Multiplanar multisequence MR imaging of the abdomen was performed both before and after the administration of intravenous contrast. Heavily T2-weighted images of the biliary and pancreatic ducts were obtained, and  three-dimensional MRCP images were rendered by post processing. CONTRAST:  42m GADAVIST GADOBUTROL 1 MMOL/ML IV SOLN COMPARISON:  Abdominal sonogram of May 15, 2020 FINDINGS: Lower chest: Marked cardiomegaly without pericardial effusion. No pleural effusion. Limited assessment of the lung bases. Hepatobiliary: Marked hepatic iron deposition without focal, suspicious hepatic lesion. Small amount of pericholecystic fluid. Minimal wall thickening with some uniform enhancement that is not significantly changed compared to a CT from 01/25/2020. No signs of cholelithiasis, sludge layering in the gallbladder. No choledocholithiasis. No biliary ductal dilation. Pancreas:  Pancreas is normal. Spleen: Spleen normal size and contour with splenic iron deposition as well. Adrenals/Urinary Tract: Adrenal glands with profound loss of signal on in phase as compared to out of phase T1 weighted gradient echo imaging. Bilateral cysts in the kidneys with simple and hemorrhagic features. No concerning renal lesion. Signs of cortical atrophy of the bilateral kidneys symmetric and bilateral. Stomach/Bowel: No acute gastrointestinal process to the extent visualized. Study not protocol for bowel evaluation. Vascular/Lymphatic: Vascular structures in the abdomen are patent. No adenopathy. Other:  No ascites.  Small pericholecystic fluid. Musculoskeletal: Profound iron deposition in marrow spaces. IMPRESSION: 1. Gallbladder wall thickening and pericholecystic fluid similar to the recent ultrasound evaluation. Gallbladder wall enhancement is similar accounting for differences in technique when compared to the CT study from February of 2021. Findings remain equivocal for acute cholecystitis, question remains given small amount of pericholecystic fluid detected on current exam. Correlation with HIDA scan may  be helpful. 2. No filling defect in the common bile duct. Sludge in the gallbladder. It is possible that the constellation of findings  could be related to recently passed calculus or hepatic dysfunction. 3. Signs of iron overload. This could be seen in the setting of treatment of anemia with Feraheme. There are no signs of recent Feraheme administration I 80 increased signal in the vasculature on precontrast imaging. 4. Iron deposition in the adrenal glands can also be seen with this medication/treatment. Correlation with adrenal function may be helpful. 5. Bilateral renal cysts. Electronically Signed   By: Zetta Bills M.D.   On: 05/15/2020 19:25      IMPRESSION:   *    RUQ, epigastric pain with bilious N/V, resolved within 24 hours.  Associated with LFT elevation which is steadily improving/normalizing. Gallstones but no evidence of cholecystitis on ultrasound and MRCP imaging. ?  Passed CBD stone  *    Esophageal dysmotility with stable, intermittent solid food dysphagia  *     Stable, chronic, macrocytic anemia. Treated with Aranesp and Feraheme.  Signs of iron overload in adrenals and bone marrow per MRI  *     CHF  *   ESRD.  Dialyzes 4 x weekly,  MTTS  *   HIV on Bitkarvy.    *   Ischemic colitis 03/2019.    PLAN:     *   Per Dr Bryan Lemma.    *    No plans to perform any tests at present so he can resume solid diet   Azucena Freed  05/17/2020, 2:17 PM Phone [phone]

## 2020-05-18 DIAGNOSIS — R7989 Other specified abnormal findings of blood chemistry: Secondary | ICD-10-CM

## 2020-05-18 DIAGNOSIS — K829 Disease of gallbladder, unspecified: Secondary | ICD-10-CM | POA: Insufficient documentation

## 2020-05-18 LAB — COMPREHENSIVE METABOLIC PANEL
ALT: 155 U/L — ABNORMAL HIGH (ref 0–44)
AST: 60 U/L — ABNORMAL HIGH (ref 15–41)
Albumin: 3.3 g/dL — ABNORMAL LOW (ref 3.5–5.0)
Alkaline Phosphatase: 269 U/L — ABNORMAL HIGH (ref 38–126)
Anion gap: 12 (ref 5–15)
BUN: 27 mg/dL — ABNORMAL HIGH (ref 8–23)
CO2: 25 mmol/L (ref 22–32)
Calcium: 9.3 mg/dL (ref 8.9–10.3)
Chloride: 99 mmol/L (ref 98–111)
Creatinine, Ser: 6.78 mg/dL — ABNORMAL HIGH (ref 0.61–1.24)
GFR calc Af Amer: 8 mL/min — ABNORMAL LOW (ref >60)
GFR calc non Af Amer: 7 mL/min — ABNORMAL LOW (ref >60)
Glucose, Bld: 94 mg/dL (ref 70–99)
Potassium: 5 mmol/L (ref 3.5–5.1)
Sodium: 136 mmol/L (ref 135–145)
Total Bilirubin: 1.4 mg/dL — ABNORMAL HIGH (ref 0.3–1.2)
Total Protein: 6.2 g/dL — ABNORMAL LOW (ref 6.5–8.1)

## 2020-05-18 LAB — CBC
HCT: 28.8 % — ABNORMAL LOW (ref 39.0–52.0)
Hemoglobin: 9.5 g/dL — ABNORMAL LOW (ref 13.0–17.0)
MCH: 35.8 pg — ABNORMAL HIGH (ref 26.0–34.0)
MCHC: 33 g/dL (ref 30.0–36.0)
MCV: 108.7 fL — ABNORMAL HIGH (ref 80.0–100.0)
Platelets: 176 10*3/uL (ref 150–400)
RBC: 2.65 MIL/uL — ABNORMAL LOW (ref 4.22–5.81)
RDW: 16.6 % — ABNORMAL HIGH (ref 11.5–15.5)
WBC: 5.8 10*3/uL (ref 4.0–10.5)
nRBC: 0 % (ref 0.0–0.2)

## 2020-05-18 LAB — PHOSPHORUS: Phosphorus: 4.7 mg/dL — ABNORMAL HIGH (ref 2.5–4.6)

## 2020-05-18 MED ORDER — CALCITRIOL 0.5 MCG PO CAPS
ORAL_CAPSULE | ORAL | Status: AC
  Administered 2020-05-18: 0.5 ug via ORAL
  Filled 2020-05-18: qty 1

## 2020-05-18 MED ORDER — WARFARIN SODIUM 5 MG PO TABS: ORAL_TABLET | ORAL | 0 refills | Status: DC

## 2020-05-18 MED ORDER — HEPARIN SODIUM (PORCINE) 1000 UNIT/ML IJ SOLN
INTRAMUSCULAR | Status: AC
  Administered 2020-05-18: 3400 [IU]
  Filled 2020-05-18: qty 4

## 2020-05-18 MED ORDER — MIDODRINE HCL 5 MG PO TABS
ORAL_TABLET | ORAL | Status: AC
  Filled 2020-05-18: qty 2

## 2020-05-18 MED ORDER — MIDODRINE HCL 5 MG PO TABS
ORAL_TABLET | ORAL | Status: AC
  Administered 2020-05-18: 10 mg via ORAL
  Filled 2020-05-18: qty 2

## 2020-05-18 NOTE — Progress Notes (Signed)
   Subjective/Chief Complaint: No abd pain, finished hd, eating regular diet   Objective: Vital signs in last 24 hours: Temp:  [97.4 F (36.3 C)-98.6 F (37 C)] 97.9 F (36.6 C) (06/15 1207) Pulse Rate:  [49-86] 72 (06/15 1207) Resp:  [15-19] 18 (06/15 1207) BP: (89-134)/(49-79) 112/66 (06/15 1207) SpO2:  [97 %-100 %] 99 % (06/15 1207) Weight:  [79.8 kg-82.4 kg] 79.8 kg (06/15 1100) Last BM Date: 05/16/20  Intake/Output from previous day: 06/14 0701 - 06/15 0700 In: 240 [P.O.:240] Out: 1451  Intake/Output this shift: Total I/O In: -  Out: 1439 [Other:1439]  GI: nontender nondistended soft  Lab Results:  Recent Labs    05/17/20 0716 05/18/20 0614  WBC 6.2 5.8  HGB 9.3* 9.5*  HCT 29.2* 28.8*  PLT 199 176   BMET Recent Labs    05/17/20 0715 05/18/20 0614  NA 137 136  K 4.4 5.0  CL 98 99  CO2 25 25  GLUCOSE 103* 94  BUN 38* 27*  CREATININE 9.14* 6.78*  CALCIUM 9.0 9.3   PT/INR No results for input(s): LABPROT, INR in the last 72 hours. ABG No results for input(s): PHART, HCO3 in the last 72 hours.  Invalid input(s): PCO2, PO2  Studies/Results: No results found.  Anti-infectives: Anti-infectives (From admission, onward)   Start     Dose/Rate Route Frequency Ordered Stop   05/15/20 1200  bictegravir-emtricitabine-tenofovir AF ([name]) 50-200-25 MG per tablet 1 tablet     Discontinue     1 tablet Oral Daily 05/15/20 0202        Assessment/Plan: Likely resolved choledocholithiasis/gb sludge -I had discussion with him today about options. Unfortunately ERCP with sphincterotomy is not option. I think this is reasonable option even thought lfts downtrending and no cholangitis- this would be done to reduce risk of further episodes but not be as morbid as surgery for him. When I calculate nsqip risk of mortality with lap chole is as high as 10% and that does not include just complications.  Without ercp as option we are left with observation vs surgery.   I discussed with him again today that observation does not carry risks of surgery but has high risk of recurrence. Surgery would prevent recurrence but carries highest risk.  He would like to go home and think about it. He understands he could have recurrence-he does have sludge which certainly puts him at risk.  I will set him up to come back to see [place] in the office.   Rolm Bookbinder 05/18/2020

## 2020-05-18 NOTE — Discharge Summary (Signed)
Physician Discharge Summary  [facility] MEQ:[id number] DOB: Oct 18, 1941 DOA: 05/14/2020  PCP: [name], NP  Admit date: 05/14/2020 Discharge date: 05/18/2020  Time spent: 35 minutes  Recommendations for Outpatient Follow-up:  1. INR check on 5/18, goal INR 2-3, titrate Coumadin dose 2. PCP in 1 week 3. [place] [facility] Dr. 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% Transaminitis Macrocytic anemia   ESRD on hemodialysis (HCC)   Hemodialysis-associated hypotension   HIV (human immunodeficiency virus infection) ([place])   Atrial fibrillation, chronic (HCC)   Abdominal pain   Elevated LFTs   Discharge Condition: Stable  Diet recommendation: Renal  Filed Weights   05/17/20 2111 05/18/20 0700 05/18/20 1100  Weight: 82.4 kg 81.7 kg 79.8 kg    History of present illness:  [name] 79[facility]Mwith history of ESRD on hemodialysisMTTS, CAD, chronic systolic CHF, EF of 79%, paroxysmal atrial fibrillation on Coumadin, HIV,presented to the emergency room with abdominal pain, vomiting, on further evaluation noted to have LFTs in the 500s and T bili of 3.1,RUQ [place]: gall bladder wall thickening and sludge  Hospital Course:   Abd pain/vomiting, gallbladder sludge Abnormal LFTS -Clinically suspect he passed a stone/resolved choledocholithiasis, gallbladder sludge -LFTs improving, symptoms have resolved and exam is benign -MRCP 6/12 - for filling defect, sludge present -Seen by cardiology for cardiac clearance, he was indicated to be high risk from a cardiac standpoint, subsequently seen by gastroenterology who felt that ERCP and sphincterotomy was not an option at this time in the setting of resolution of symptoms, improving LFTs. 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 -Hemoglobin at baseline, monitor  History of HIV: CD4 T-cell absolute count 406 on 01/21/20. -ContinueBiktarvy   Discharge Exam: Vitals:   05/18/20 1100 05/18/20 1207  BP: (!) 108/57 112/66  Pulse: 68 72  Resp:  18  Temp: (!) 97.4 F (36.3 C) 97.9 F (36.6 C)  SpO2: 100% 99%    General: AAOx3 Cardiovascular:S1S2/RRR Respiratory: CTAB  Discharge Instructions   Discharge Instructions    Discharge instructions   Complete by: As directed    Renal Diet   Increase activity slowly   Complete by: As directed      Allergies as of 05/18/2020      Reactions   Dextromethorphan-guaifenesin Other (See Comments)   Unknown reaction   Tocotrienols Other (See Comments)   Unknown reaction   Losartan Potassium Other (See Comments)   Causes constipation      Medication List    TAKE these medications   acetaminophen 500 MG tablet Commonly known as: TYLENOL Take 1,000 mg by mouth every 6 (six) hours as needed for moderate pain.   allopurinol 100 MG  tablet Commonly known as: ZYLOPRIM Take 1 tablet (100 mg total) by mouth See admin instructions. Take one tablet (100 mg) by mouth Mondays, Tuesday, Thursday, Saturday after dialysis   amiodarone 200 MG  tablet Commonly known as: PACERONE Take 1 tablet (200 mg total) by mouth daily. What changed: when to take this   [name] 50-200-25 MG Tabs tablet Generic drug: bictegravir-emtricitabine-tenofovir AF Take 1 tablet by mouth daily. What changed: when to take this   brimonidine 0.15 % ophthalmic solution Commonly known as: ALPHAGAN Place 1 drop into the right eye 2 (two) times daily.   latanoprost 0.005 % ophthalmic solution Commonly known as: XALATAN Place 1 drop into the right eye at bedtime.   loratadine 10 MG tablet Commonly known as: CLARITIN Take 10 mg by mouth daily at 12 noon.   midodrine 10 MG tablet Commonly known as: PROAMATINE Take 1 tablet (10 mg total) by mouth as directed. Twice a day on the day of HD Tuesday, Thursday, Saturday (1 in AM and 1 at [place]). Take 1/2 tablet (5mg ) a day on non HD days What changed:   when to take this  additional instructions   montelukast 10 MG tablet Commonly known as: SINGULAIR Take 1 tablet (10 mg total) by mouth at bedtime. NEEDS OV/FOLLOW UP   multivitamin with minerals Tabs tablet Take 1 tablet by mouth every evening.   polyethylene glycol 17 g packet Commonly known as: MIRALAX / GLYCOLAX Take 17 g by mouth every evening.   traMADol 50 MG tablet Commonly known as: Ultram Take 1 tablet (50 mg total) by mouth every 6 (six) hours as needed. What changed: reasons to take this   Velphoro 500 MG chewable tablet Generic drug: sucroferric oxyhydroxide Chew 500 mg by mouth 3 (three) times daily with meals.   warfarin 5 MG tablet Commonly known as: COUMADIN Take as directed. If you are unsure how to take this medication, talk to your nurse or doctor. Original instructions: Take 7.5mg  daily for next 3days and then check INR 6/18 What changed: additional instructions      Allergies  Allergen Reactions  . Dextromethorphan-Guaifenesin Other (See Comments)    Unknown reaction  . Tocotrienols Other (See Comments)     Unknown reaction  . Losartan Potassium Other (See Comments)    Causes constipation    Follow-up Information    [name], NP. Schedule an appointment as soon as possible for a visit in 1 week(s).   Specialty: Internal Medicine Why: please check INR in 3-4days Contact information: [address] [place] [address] [phone]        Tommy Medal, Lavell Islam, MD .   Specialty: Infectious Diseases Contact information: [address] [place] [address] [phone]        Croitoru, Dani Gobble, MD. Schedule an appointment as soon as possible for a visit in 1 month(s).   Specialty: Cardiology Contact information: [address] [place] [address] [phone]                The results of significant diagnostics from this hospitalization (including imaging, microbiology, ancillary and laboratory) are listed below for reference.    Significant Diagnostic Studies: MR ABDOMEN MRCP W WO CONTAST  Result Date: 05/15/2020 CLINICAL DATA:  Abdominal pain, unspecified abdominal pain EXAM: MRI ABDOMEN WITHOUT AND WITH CONTRAST (INCLUDING MRCP) TECHNIQUE: Multiplanar multisequence MR imaging of the abdomen was performed both before and after the administration of intravenous contrast. Heavily T2-weighted images of  the biliary and pancreatic ducts were obtained, and three-dimensional MRCP images were rendered by post processing. CONTRAST:  71mL GADAVIST GADOBUTROL 1 MMOL/ML IV SOLN COMPARISON:  Abdominal sonogram of May 15, 2020 FINDINGS: Lower chest: Marked cardiomegaly without pericardial effusion. No pleural effusion. Limited assessment of the lung bases. Hepatobiliary: Marked hepatic iron deposition without focal, suspicious hepatic lesion. Small amount of pericholecystic fluid. Minimal wall thickening with some uniform enhancement that is not significantly changed compared to a CT from 01/25/2020. No signs of cholelithiasis, sludge layering  in the gallbladder. No choledocholithiasis. No biliary ductal dilation. Pancreas:  Pancreas is normal. Spleen: Spleen normal size and contour with splenic iron deposition as well. Adrenals/Urinary Tract: Adrenal glands with profound loss of signal on in phase as compared to out of phase T1 weighted gradient echo imaging. Bilateral cysts in the kidneys with simple and hemorrhagic features. No concerning renal lesion. Signs of cortical atrophy of the bilateral kidneys symmetric and bilateral. Stomach/Bowel: No acute gastrointestinal process to the extent visualized. Study not protocol for bowel evaluation. Vascular/Lymphatic: Vascular structures in the abdomen are patent. No adenopathy. Other:  No ascites.  Small pericholecystic fluid. Musculoskeletal: Profound iron deposition in marrow spaces. IMPRESSION: 1. Gallbladder wall thickening and pericholecystic fluid similar to the recent ultrasound evaluation. Gallbladder wall enhancement is similar accounting for differences in technique when compared to the CT study from February of 2021. Findings remain equivocal for acute cholecystitis, question remains given small amount of pericholecystic fluid detected on current exam. Correlation with HIDA scan may be helpful. 2. No filling defect in the common bile duct. Sludge in the gallbladder. It is possible that the constellation of findings could be related to recently passed calculus or hepatic dysfunction. 3. Signs of iron overload. This could be seen in the setting of treatment of anemia with Feraheme. There are no signs of recent Feraheme administration I 80 increased signal in the vasculature on precontrast imaging. 4. Iron deposition in the adrenal glands can also be seen with this medication/treatment. Correlation with adrenal function may be helpful. 5. Bilateral renal cysts. Electronically Signed   By: Zetta Bills M.D.   On: 05/15/2020 19:25   US Abdomen Limited RUQ  Result Date: 05/15/2020 CLINICAL DATA:   Right upper quadrant abdominal pain EXAM: ULTRASOUND ABDOMEN LIMITED RIGHT UPPER QUADRANT COMPARISON:  May 10, 2014 FINDINGS: Gallbladder: There is diffuse gallbladder wall thickening, measuring up to approximately 1.5 cm. There is gallbladder sludge. There are no distinct gallstones. The sonographic Percell Miller sign is negative. Common bile duct: Diameter: 5 mm Liver: No focal lesion identified. Within normal limits in parenchymal echogenicity. Portal vein is patent on color Doppler imaging with normal direction of blood flow towards the liver. Other: The right kidney is echogenic without evidence for hydronephrosis. IMPRESSION: 1. There is gallbladder wall thickening which is nonspecific. This can be seen in patients with heart failure among other causes. In the absence of a positive sonographic Murphy sign, findings are less likely to represent acute cholecystitis. 2. Echogenic right kidney consistent with medical renal disease. Electronically Signed   By: Constance Holster M.D.   On: 05/15/2020 01:15   DG ESOPHAGUS W SINGLE CM (SOL OR THIN BA)  Result Date: 04/21/2020 CLINICAL DATA:  Feeling of something stuck in upper esophagus. EXAM: ESOPHOGRAM/BARIUM SWALLOW TECHNIQUE: Single contrast examination was performed using  thin barium. FLUOROSCOPY TIME:  Fluoroscopy Time:  2 minutes and 6 seconds Radiation Exposure Index (if provided by the fluoroscopic device): Not applicable. Number of Acquired Spot Images:  0 COMPARISON:  None. FINDINGS: Due to patient's immobility and clinical status, a single-contrast exam was performed with the patient in LPO position. Evaluation of esophageal peristalsis/mobility demonstrates proximal escape waves and contrast stasis in the lower esophagus. Full column evaluation of the esophagus demonstrates no persistent narrowing or stricture. A 13 mm barium tablet has delayed passage in the lower esophagus. IMPRESSION: 1. Limited, single-contrast exam performed as detailed above. 2.  Esophageal dysmotility, likely presbyesophagus. 3. Delayed passage of a barium tablet in the lower esophagus, likely due to dysmotility. Electronically Signed   By: Abigail Miyamoto M.D.   On: 04/21/2020 09:53    Microbiology: Recent Results (from the past 240 hour(s))  SARS Coronavirus 2 by RT PCR (hospital order, performed in [name] hospital lab) Nasopharyngeal Nasopharyngeal Swab     Status: None   Collection Time: 05/15/20  2:11 AM   Specimen: Nasopharyngeal Swab  Result Value Ref Range Status   SARS Coronavirus 2 NEGATIVE NEGATIVE Final    Comment: (NOTE) SARS-CoV-2 target nucleic acids are NOT DETECTED.  The SARS-CoV-2 RNA is generally detectable in upper and lower respiratory specimens during the acute phase of infection. The lowest concentration of SARS-CoV-2 viral copies this assay can detect is 250 copies / mL. A negative result does not preclude SARS-CoV-2 infection and should not be used as the sole basis for treatment or other patient management decisions.  A negative result may occur with improper specimen collection / handling, submission of specimen other than nasopharyngeal swab, presence of viral mutation(s) within the areas targeted by this assay, and inadequate number of viral copies (<250 copies / mL). A negative result must be combined with clinical observations, patient history, and epidemiological information.  Fact Sheet for Patients:   StrictlyIdeas.no  Fact Sheet for Healthcare Providers: BankingDealers.co.za  This test is not yet approved or  cleared by the [place] FDA and has been authorized for detection and/or diagnosis of SARS-CoV-2 by FDA under an Emergency Use Authorization (EUA).  This EUA will remain in effect (meaning this test can be used) for the duration of the COVID-19 declaration under Section 564(b)(1) of the Act, 21 U.S.C. section 360bbb-3(b)(1), unless the authorization is terminated  or revoked sooner.  Performed at [facility], [place] [address] [place], [place] [address]   Surgical pcr screen     Status: None   Collection Time: 05/16/20  8:22 PM   Specimen: Nasal Mucosa; Nasal Swab  Result Value Ref Range Status   MRSA, PCR NEGATIVE NEGATIVE Final   Staphylococcus aureus NEGATIVE NEGATIVE Final    Comment: (NOTE) The Xpert SA Assay (FDA approved for NASAL specimens in patients 50 years of age and older), is one component of a comprehensive surveillance program. It is not intended to diagnose infection nor to guide or monitor treatment. Performed at [facility], [place] [address] [place], [place] [address]      Labs: Basic Metabolic Panel: Recent Labs  Lab 05/15/20 0452 05/16/20 0724 05/17/20 0609 05/17/20 0715 05/18/20 0614  NA 138 137 137 137 136  K 5.3* 3.8 4.8 4.4 5.0  CL 98 99 99 98 99  CO2 26 26 26 25 25   GLUCOSE 88 77 96 103* 94  BUN 39* 23 38* 38* 27*  CREATININE 9.62* 5.96* 9.20* 9.14* 6.78*  CALCIUM 9.4 8.0* 8.9 9.0 9.3  PHOS  --   --   --  4.7* 4.7*   Liver Function Tests: Recent Labs  Lab 05/14/20 1651 05/14/20  1651 05/15/20 0452 05/16/20 0724 05/17/20 0609 05/17/20 0715 05/18/20 0614  AST 501*  --  291* 144* 79*  --  60*  ALT 489*  --  391* 279* 206*  --  155*  ALKPHOS 311*  --  341* 341* 297*  --  269*  BILITOT 3.1*  --  3.4* 1.5* 1.1  --  1.4*  PROT 7.4  --  6.7 6.4* 6.2*  --  6.2*  ALBUMIN 3.9   < > 3.6 3.2* 3.2* 3.3* 3.3*   < > = values in this interval not displayed.   Recent Labs  Lab 05/14/20 1651  LIPASE 37   No results for input(s): AMMONIA in the last 168 hours. CBC: Recent Labs  Lab 05/15/20 0452 05/16/20 0724 05/17/20 0609 05/17/20 0716 05/18/20 0614  WBC 5.0 4.5 5.7 6.2 5.8  HGB 9.6* 9.4* 9.4* 9.3* 9.5*  HCT 29.7* 28.3* 28.4* 29.2* 28.8*  MCV 108.4* 107.2* 108.8* 110.2* 108.7*  PLT 183 153 170 199 176   Cardiac Enzymes: No results for input(s): CKTOTAL, CKMB, CKMBINDEX, TROPONINI  in the last 168 hours. BNP: BNP (last 3 results) Recent Labs    12/11/19 1331 01/08/20 1032 01/09/20 0222  BNP 632.3* 890.8* 641.5*    ProBNP (last 3 results) No results for input(s): PROBNP in the last 8760 hours.  CBG: No results for input(s): GLUCAP in the last 168 hours.     Signed:  Domenic Polite MD.  [facility] 05/18/2020, 2:16 PM

## 2020-05-18 NOTE — Consult Note (Signed)
   [facility] Inpatient Consult   05/18/2020  [place] 04/02/1941 [id number]   [facility] ACO Patient: [facility] Medicare  Patient was assessed for [facility] for community services. Patient was previously assigned to [facility] but patient went to a skilled facility.  Chart reviewed for restart of services with patient with [name].  Patient is scored as extreme high risk for unplanned readmissions.  Plan: Reached out to inpatient Transition of Care [facility] and will refer to [facility] for complex disease management.  Of note, [facility] Care Management services does not replace or interfere with any services that are arranged by inpatient [facility] care management team.   For additional questions or referrals please contact:  Natividad Brood, RN BSN [facility] Liaison  [phone] business mobile phone Toll free office [phone]  Fax number: [phone] [email or web address]

## 2020-05-18 NOTE — Progress Notes (Signed)
° °         Daily Rounding Note  05/18/2020, 9:46 AM  LOS: 2 days   SUBJECTIVE:   Chief complaint: elvated LFTs. Abdominal pain, bilious emesis      No recurrence of N/V, abd pain.  Brown stool yesterday.  Tolerating solid food  OBJECTIVE:         Vital signs in last 24 hours:    Temp:  [98.3 F (36.8 C)-98.6 F (37 C)] 98.5 F (36.9 C) (06/15 0700) Pulse Rate:  [49-86] 83 (06/15 0900) Resp:  [15-19] 19 (06/15 0700) BP: (89-134)/(49-79) 89/60 (06/15 0900) SpO2:  [97 %-100 %] 97 % (06/15 0700) Weight:  [81 kg-82.4 kg] 81.7 kg (06/15 0700) Last BM Date: 05/16/20 Filed Weights   05/17/20 1113 05/17/20 2111 05/18/20 0700  Weight: 81 kg 82.4 kg 81.7 kg   General: comfortable, resting quietly, easily awakened during HD session   Heart: RRR Chest: no labored breathing or cough Abdomen: soft, NT, ND.  Active BS  Extremities: no CCE Neuro/Psych:  Oriented x 3.  Alert.  No tremors or obvious deficits.    Intake/Output from previous day: 06/14 0701 - 06/15 0700 In: 240 [P.O.:240] Out: 1451   Intake/Output this shift: No intake/output data recorded.  Lab Results: Recent Labs    05/17/20 0609 05/17/20 0716 05/18/20 0614  WBC 5.7 6.2 5.8  HGB 9.4* 9.3* 9.5*  HCT 28.4* 29.2* 28.8*  PLT 170 199 176   BMET Recent Labs    05/17/20 0609 05/17/20 0715 05/18/20 0614  NA 137 137 136  K 4.8 4.4 5.0  CL 99 98 99  CO2 26 25 25   GLUCOSE 96 103* 94  BUN 38* 38* 27*  CREATININE 9.20* 9.14* 6.78*  CALCIUM 8.9 9.0 9.3   LFT Recent Labs    05/16/20 0724 05/16/20 0724 05/17/20 0609 05/17/20 0715 05/18/20 0614  PROT 6.4*  --  6.2*  --  6.2*  ALBUMIN 3.2*   < > 3.2* 3.3* 3.3*  AST 144*  --  79*  --  60*  ALT 279*  --  206*  --  155*  ALKPHOS 341*  --  297*  --  269*  BILITOT 1.5*  --  1.1  --  1.4*   < > = values in this interval not displayed.   PT/INR No results for input(s): LABPROT, INR in the last 72  hours. Hepatitis Panel No results for input(s): HEPBSAG, HCVAB, HEPAIGM, HEPBIGM in the last 72 hours.  Studies/Results: No results found.  ASSESMENT:   *    RUQ, epigastric pain with bilious N/V, resolved within 24 hours.  Associated LFT elevation continues improved, not yet normal. GB sludge but no cholecystitis on ultrasound and MRCP imaging. ?  Passed CBD stone  *    Esophageal dysmotility with stable, intermittent solid food dysphagia.   *     Stable, chronic, macrocytic anemia. Treated with Aranesp and Feraheme.  Signs of iron overload in adrenals and bone marrow per MRI.    *     CHF  *   ESRD.  Dialyzes 4 x weekly,  MTTS  *   HIV on Bitkarvy.     PLAN   *   No plans for ERCP given resolution of sxs, resolving LFTS, no pancreatitis, and high CV/resp risk for sedation.   Will sign off.      Azucena Freed  05/18/2020, 9:46 AM Phone [phone]

## 2020-05-18 NOTE — Progress Notes (Signed)
[facility] to be discharged Home per MD order. Discussed prescriptions and follow up appointments with the patient. Prescriptions given to patient; medication list explained in detail. Patient verbalized understanding.  Skin clean, dry and intact without evidence of skin break down, no evidence of skin tears noted. IV catheter discontinued intact. Site without signs and symptoms of complications. Dressing and pressure applied. Pt denies pain at the site currently. No complaints noted.  Patient free of lines, drains, and wounds.   An After Visit Summary (AVS) was printed and given to the patient. Patient escorted via wheelchair, and discharged home via private auto.  Amaryllis Dyke, RN

## 2020-05-18 NOTE — Progress Notes (Signed)
[facility] Progress Note   Subjective:   Patient seen on HD, tolerating, BP stable and no cramping. Denies SOB, orthopnea, CP, palpitations, abdominal pain , N/V/D.   Objective Vitals:   05/18/20 0706 05/18/20 0730 05/18/20 0800 05/18/20 0830  BP: 131/70 114/76 121/79 (!) 107/49  Pulse: 73 70 71 (!) 49  Resp:      Temp:      TempSrc:      SpO2:      Weight:      Height:       Physical Exam General: Well developed male, alert and in NAD Heart: RRR, no murmurs, rubs or gallops Lungs: CTA bilaterally without wheezing, rhonchi or rales Abdomen: Soft non-tender, non-distended, +BS Extremities: No edema b/l lower extremities Dialysis Access:  [name] accessed  Additional Objective Labs: Basic Metabolic Panel: Recent Labs  Lab 05/17/20 0609 05/17/20 0715 05/18/20 0614  NA 137 137 136  K 4.8 4.4 5.0  CL 99 98 99  CO2 26 25 25   GLUCOSE 96 103* 94  BUN 38* 38* 27*  CREATININE 9.20* 9.14* 6.78*  CALCIUM 8.9 9.0 9.3  PHOS  --  4.7* 4.7*   Liver Function Tests: Recent Labs  Lab 05/16/20 0724 05/16/20 0724 05/17/20 0609 05/17/20 0715 05/18/20 0614  AST 144*  --  79*  --  60*  ALT 279*  --  206*  --  155*  ALKPHOS 341*  --  297*  --  269*  BILITOT 1.5*  --  1.1  --  1.4*  PROT 6.4*  --  6.2*  --  6.2*  ALBUMIN 3.2*   < > 3.2* 3.3* 3.3*   < > = values in this interval not displayed.   Recent Labs  Lab 05/14/20 1651  LIPASE 37   CBC: Recent Labs  Lab 05/15/20 0452 05/15/20 0452 05/16/20 0724 05/16/20 0724 05/17/20 0609 05/17/20 0716 05/18/20 0614  WBC 5.0   < > 4.5   < > 5.7 6.2 5.8  HGB 9.6*   < > 9.4*   < > 9.4* 9.3* 9.5*  HCT 29.7*   < > 28.3*   < > 28.4* 29.2* 28.8*  MCV 108.4*  --  107.2*  --  108.8* 110.2* 108.7*  PLT 183   < > 153   < > 170 199 176   < > = values in this interval not displayed.   Blood Culture    Component Value Date/Time   SDES BLOOD RIGHT HAND 12/11/2019 2100   SPECREQUEST  12/11/2019 2100     BOTTLES DRAWN AEROBIC ONLY Blood Culture results may not be optimal due to an inadequate volume of blood received in culture bottles   CULT  12/11/2019 2100    NO GROWTH 5 DAYS Performed at [facility] [address], [place], [place] [address]    REPTSTATUS 12/16/2019 FINAL 12/11/2019 2100   Medications:  . allopurinol  100 mg Oral Once per day on Mon Tue Thu Sat  . amiodarone  200 mg Oral Q1200  . bictegravir-emtricitabine-tenofovir AF  1 tablet Oral Q1200  . brimonidine  1 drop Right Eye BID  . calcitRIOL  0.5 mcg Oral Q T,Th,Sa-HD  . Chlorhexidine Gluconate Cloth  6 each Topical Q0600  . darbepoetin (ARANESP) injection - DIALYSIS  40 mcg Intravenous Q Mon-HD  . latanoprost  1 drop Right Eye QHS  . loratadine  10 mg Oral Q1200  . midodrine      .  midodrine  10 mg Oral Once per day on Mon Tue Thu Sat  . midodrine  20 mg Oral Once per day on Mon Tue Thu Sat  . midodrine  5 mg Oral Once per day on Sun Wed Fri  . montelukast  10 mg Oral QHS  . multivitamin  1 tablet Oral QHS  . polyethylene glycol  17 g Oral QPM  . sucroferric oxyhydroxide  500 mg Oral TID WC    [facility] hours, 180NRe, BFR 400, DFR 600, EDW 84kg, 3K, 2Ca, UF profile 4,TDC Heparin 2000 unit bolus Mircera 47mcg IVP q HD- last dose 5/1, Hgb <11 Calcitriol 0.12mcg PO q HD   Assessment/Plan: 1. Gallbladder sludge, elevated LFTS: seen by GI, suspected to have passed a small stone. Next step MRCP and possibly lap chole eventually. Currently feeling well with no abdominal pain. Seen by cardiology yesterday pre-op and determined to be high risk. Per GI, no role for ERCP right now.  2. ESRD:On MTTS schedule for volume management.K+ 5.0. Next HD 6/17- can be done at outpatient unit if discharged.TDC dependent at this time- AVG recently clotted and has been referred back to [facility] as outpatient but has limited access options at this  point. 3. Hypertension: BPchronically soft on HD.Takes midodrine pre-HD outpatient, will continue here. Also restarted midodrine 5mg  on non-HD days. 4. Anemia:Hgb 9.5, received aranesp 75mcg on 0/03/49.  5. Metabolic bone disease:Calcium and phos at goal. Continue VDRA and binders.  6. Nutrition:Renal diet with fluid restrictions.   Anice Paganini, PA-C 05/18/2020, 8:50 AM  [facility] Pager: [phone]

## 2020-05-19 ENCOUNTER — Telehealth: Payer: Self-pay | Admitting: *Deleted

## 2020-05-19 ENCOUNTER — Other Ambulatory Visit: Payer: Self-pay

## 2020-05-19 ENCOUNTER — Telehealth: Payer: Self-pay | Admitting: Nurse Practitioner

## 2020-05-19 NOTE — Telephone Encounter (Signed)
Transition of care contact from inpatient facility  Date of discharge: 05/18/2020 Date of contact: 05/18/2020 Method: Phone Spoke to: Patient  Patient contacted to discuss transition of care from recent inpatient hospitalization. Patient was admitted to [facility] from 06/11-06/15/2012 with discharge diagnosis of  Abd pain/vomiting, gallbladder sludge Abnormal LFTS Medication changes were reviewed.  Patient will follow up with his/her outpatient HD unit on: 05/20/2020.

## 2020-05-19 NOTE — Telephone Encounter (Signed)
1st attempt. Unable to reach patient.   

## 2020-05-19 NOTE — Patient Outreach (Signed)
[facility]) Care Management  05/19/2020  [facility] [place] [birth year] [id number]     Transition of Care Referral  Referral Date: 05/19/2020 Referral Source: Hospital Liaison Date of Discharge: 05/18/2020 Facility: [place]: [facility] Medicare PCP Office Does [facility]   Outreach attempt # 1 to patient. Someone picked up but would not say anything after asking to speak with patient several times.     Plan: RN CM will make outreach attempt to patient within 3-4 business days.  RN CM will send unsuccessful outreach letter to patient.    Enzo Montgomery, RN,BSN,CCM [place] [facility] Telephonic Care Management Coordinator Direct Phone: [phone] Toll Free: [phone] Fax: [phone]

## 2020-05-20 DIAGNOSIS — N186 End stage renal disease: Secondary | ICD-10-CM | POA: Diagnosis not present

## 2020-05-20 DIAGNOSIS — Z992 Dependence on renal dialysis: Secondary | ICD-10-CM | POA: Diagnosis not present

## 2020-05-20 DIAGNOSIS — E8770 Fluid overload, unspecified: Secondary | ICD-10-CM | POA: Diagnosis not present

## 2020-05-20 DIAGNOSIS — N2581 Secondary hyperparathyroidism of renal origin: Secondary | ICD-10-CM | POA: Diagnosis not present

## 2020-05-20 NOTE — Telephone Encounter (Signed)
OK to change to hospital follow up please.

## 2020-05-20 NOTE — Telephone Encounter (Signed)
I have made two attempts and have been unable to reach patient. Pt has PCP follow up appt scheduled 05/24/20. This could be changed to hospital follow up appt based on PCP approval.

## 2020-05-21 ENCOUNTER — Other Ambulatory Visit: Payer: Self-pay

## 2020-05-21 NOTE — Patient Outreach (Signed)
[facility]) Care Management  05/21/2020  [facility] [place] Mar 15, 1941 [id number]    Transition of Care Referral  Referral Date: 05/19/2020 Referral Source: Hospital Liaison Date of Discharge: 05/18/2020 Facility: [place] Medicare PCP Office Does [facility]    Outreach attempt #2 to patient. No answer after several rings and unable to leave message.     Plan: RN CM will make outreach attempt to patient within 3-4 business days.   Enzo Montgomery, RN,BSN,CCM [place] Management Telephonic Care Management Coordinator Direct Phone: [phone] Toll Free: [phone] Fax: [phone]

## 2020-05-22 DIAGNOSIS — Z992 Dependence on renal dialysis: Secondary | ICD-10-CM | POA: Diagnosis not present

## 2020-05-22 DIAGNOSIS — E8770 Fluid overload, unspecified: Secondary | ICD-10-CM | POA: Diagnosis not present

## 2020-05-22 DIAGNOSIS — N186 End stage renal disease: Secondary | ICD-10-CM | POA: Diagnosis not present

## 2020-05-22 DIAGNOSIS — N2581 Secondary hyperparathyroidism of renal origin: Secondary | ICD-10-CM | POA: Diagnosis not present

## 2020-05-24 ENCOUNTER — Other Ambulatory Visit: Payer: Self-pay

## 2020-05-24 ENCOUNTER — Encounter: Payer: Self-pay | Admitting: Family

## 2020-05-24 ENCOUNTER — Ambulatory Visit (INDEPENDENT_AMBULATORY_CARE_PROVIDER_SITE_OTHER): Payer: Medicare HMO | Admitting: Family

## 2020-05-24 VITALS — BP 118/61 | HR 77 | Temp 97.8°F | Resp 16 | Ht 74.0 in | Wt 171.0 lb

## 2020-05-24 DIAGNOSIS — Z992 Dependence on renal dialysis: Secondary | ICD-10-CM | POA: Diagnosis not present

## 2020-05-24 DIAGNOSIS — R7401 Elevation of levels of liver transaminase levels: Secondary | ICD-10-CM | POA: Diagnosis not present

## 2020-05-24 DIAGNOSIS — E8770 Fluid overload, unspecified: Secondary | ICD-10-CM | POA: Diagnosis not present

## 2020-05-24 DIAGNOSIS — N186 End stage renal disease: Secondary | ICD-10-CM

## 2020-05-24 DIAGNOSIS — N2581 Secondary hyperparathyroidism of renal origin: Secondary | ICD-10-CM | POA: Diagnosis not present

## 2020-05-24 DIAGNOSIS — I4891 Unspecified atrial fibrillation: Secondary | ICD-10-CM | POA: Diagnosis not present

## 2020-05-24 LAB — PROTIME-INR
INR: 1.7 ratio — ABNORMAL HIGH (ref 0.8–1.0)
Prothrombin Time: 18.5 s — ABNORMAL HIGH (ref 9.6–13.1)

## 2020-05-24 NOTE — Patient Instructions (Signed)
Please complete lab work prior to leaving.   

## 2020-05-24 NOTE — Progress Notes (Signed)
Subjective:    Patient ID: David [name], male    DOB: October 10, 1941, 79 y.o.   MRN: [id number]  HPI    Patient is a 79 year old male who presents today for hospital follow-up.  Hospital discharge summary is reviewed.  The patient was admitted June 11 through May 19, 2019 following a virtual visit for nausea and vomiting.  Work-up revealed coledocholithiasis/gallbladder sludge.  Work-up included an MRCP performed on June 12 which noted sludge.  His symptoms improved as well as his liver function tests and therefore GI decided not to pursue ERCP/sphincterotomy.  He was felt to be high risk per cardiology.  Reports tolerating PO's. Denies N/V/D.   [facility] [place].   CHF- Denies SOB or chest pain.  Wt Readings from Last 3 Encounters:  05/24/20 171 lb (77.6 kg)  05/18/20 175 lb 14.8 oz (79.8 kg)  04/12/20 182 lb 2 oz (82.6 kg)      Review of Systems See HPI  Past Medical History:  Diagnosis Date  . 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 Acute on chronic systolic and diastolic heart failure, NYHA class 4 ([place])   . Anemia, iron deficiency 11/15/2011  . Arthritis    "hands, right knee, feet" (02/21/2018)  . Atrial fibrillation ([place])   . Cardiac arrest ([place]) 02/17/2018  . CHF (congestive heart failure) ([place])   . Chronic combined systolic and diastolic heart failure, NYHA class 3 ([place]) 06/2010   felt to be secondary to hypertensive cardiomyopathy  . Chronic lower back pain   . CKD (chronic kidney disease) stage V requiring chronic dialysis ([place])   . COVID-19 12/2019  . ESRD on dialysis [facility])    started 02/2018 [place]  . GERD (gastroesophageal reflux disease)   . Glaucoma    "I had surgery and dont have it any more"  . Gout    daily RX (02/21/2018)  . Heart murmur    "mild" per pt  . Hepatitis    years ago  . HIV (human immunodeficiency virus infection) ([place])   . HIV infection ([place])   . Hyperlipidemia   . Hypertension   .  Nonischemic cardiomyopathy ([place])   . Pneumonia 11/2017  . Prostate cancer ([place])    hx of prostate; s/p radioactive seed implant 10/2009 Dr Janice Norrie  . Sinus bradycardia   . Sleep apnea    does not use a cpap  . Stroke [facility])    "mini stroke" years ago  . Wears glasses      Social History   Socioeconomic History  . Marital status: Married    Spouse name: Not on file  . Number of children: 2  . Years of education: 24  . Highest education level: Not on file  Occupational History  . 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 Smoking status: Former Smoker    Packs/day: 1.00    Years: 30.00    Pack years: 30.00    Types: Cigarettes    Quit date: 2006    Years since quitting: 15.4  . Smokeless tobacco: Never Used  Vaping Use  . Vaping Use: Never used  Substance and Sexual Activity  . Alcohol use: Yes    Alcohol/week: 1.0 standard drink    Types: 1 Shots of liquor per week    Comment: occasional  . Drug use: Never  . Sexual activity: Not Currently    Comment: declined condoms 09/2019  Other Topics  Concern  . 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   Social Determinants of Health   Financial Resource Strain:   . Difficulty of Paying Living Expenses:   Food Insecurity: No Food Insecurity  . Worried About Charity fundraiser in the Last Year: Never true  . Ran Out of Food in the Last Year: Never true  Transportation Needs: No Transportation Needs  . Lack of Transportation (Medical): No  . Lack of Transportation (Non-Medical): No  Physical Activity:   . Days of Exercise per Week:   . Minutes of Exercise per Session:   Stress:   . Feeling of Stress :   Social Connections:   . Frequency of Communication with Friends and Family:   .  Frequency of Social Gatherings with Friends and Family:   . Attends Religious Services:   . Active Member of Clubs or Organizations:   . Attends Archivist Meetings:   Marland Kitchen Marital Status:   Intimate Partner Violence:   . Fear of Current or Ex-Partner:   . Emotionally Abused:   Marland Kitchen Physically Abused:   . Sexually Abused:     Past Surgical History:  Procedure Laterality Date  . AV FISTULA PLACEMENT Right 10/13/2016   Procedure: ARTERIOVENOUS (AV) FISTULA CREATION;  Surgeon: [name], MD;  Location: [place];  Service: Vascular;  Laterality: Right;  . AV FISTULA PLACEMENT Left 02/25/2018   Procedure: INSERTION OF ARTERIOVENOUS (AV) GORE-TEX GRAFT LEFT UPPER ARM;  Surgeon: Angelia Mould, MD;  Location: [place];  Service: Vascular;  Laterality: Left;  . AV FISTULA PLACEMENT Right 08/07/2018   Procedure: Creation of right arm brachiocephalic Fistula;  Surgeon: Waynetta Sandy, MD;  Location: [place];  Service: Vascular;  Laterality: Right;  . AV FISTULA PLACEMENT Left 11/10/2019   Procedure: INSERTION OF ARTERIOVENOUS (AV) GORE-TEX GRAFT THIGH;  Surgeon: Elam Dutch, MD;  Location: [place];  Service: Vascular;  Laterality: Left;  . AV FISTULA PLACEMENT Right 12/02/2019   Procedure: INSERTION OF ARTERIOVENOUS (AV) GORE-TEX GRAFT RIGHT  THIGH;  Surgeon: Waynetta Sandy, MD;  Location: [place];  Service: Vascular;  Laterality: Right;  . BASCILIC VEIN TRANSPOSITION Left 06/24/2015   Procedure: BASILIC VEIN TRANSPOSITION;  Surgeon: Mal Misty, MD;  Location: [place];  Service: Vascular;  Laterality: Left;  . BIOPSY  03/14/2019   Procedure: BIOPSY;  Surgeon: Irene Shipper, MD;  Location: [place];  Service: Endoscopy;;  . CATARACT EXTRACTION, BILATERAL Bilateral   . COLONOSCOPY WITH PROPOFOL N/A 03/14/2019   Procedure: COLONOSCOPY WITH PROPOFOL;  Surgeon: Irene Shipper, MD;  Location[facility] ENDOSCOPY;  Service: Endoscopy;  Laterality: N/A;  .  ESOPHAGOGASTRODUODENOSCOPY (EGD) WITH PROPOFOL N/A 05/05/2014   Procedure: ESOPHAGOGASTRODUODENOSCOPY (EGD) WITH PROPOFOL;  Surgeon: Missy Sabins, MD;  Location: [facility] ENDOSCOPY;  Service: Endoscopy;  Laterality: N/A;  . EYE SURGERY Bilateral    cataract surgery   . GLAUCOMA SURGERY Bilateral   . INSERTION OF ARTERIOVENOUS (AV) ARTEGRAFT ARM  10/09/2018   Procedure: INSERTION OF ARTERIOVENOUS (AV) 60mm x 41cm ARTEGRAFT LEFT UPPER ARM;  Surgeon: Waynetta Sandy, MD;  Location: [place];  Service: Vascular;;  . INSERTION OF DIALYSIS  CATHETER Right 07/14/2019   Procedure: Insertion Of Dialysis Catheter;  Surgeon: Elam Dutch, MD;  Location: [facility] OR;  Service: Vascular;  Laterality: Right;  placed right Internal Jugular  . INSERTION PROSTATE RADIATION SEED    . IR FLUORO GUIDE CV LINE RIGHT  02/18/2018  . IR REMOVAL TUN CV CATH W/O FL  12/06/2018  . IR THROMBECTOMY AV FISTULA W/THROMBOLYSIS/PTA INC/SHUNT/IMG RIGHT Right 01/26/2020  . IR THROMBECTOMY AV FISTULA W/THROMBOLYSIS/PTA INC/SHUNT/IMG RIGHT Right 02/24/2020  . IR THROMBECTOMY AV FISTULA W/THROMBOLYSIS/PTA INC/SHUNT/IMG RIGHT Right 04/07/2020  . IR US GUIDE VASC ACCESS RIGHT  02/18/2018  . IR US GUIDE VASC ACCESS RIGHT  01/26/2020  . IR US GUIDE VASC ACCESS RIGHT  04/07/2020  . LEFT HEART CATH AND CORONARY ANGIOGRAPHY N/A 02/20/2018   Procedure: LEFT HEART CATH AND CORONARY ANGIOGRAPHY;  Surgeon: Jettie Booze, MD;  Location: [place] CV LAB;;; 50% ostial OM2, 25% mLAD.  Marland Kitchen LEFT HEART CATH AND CORONARY ANGIOGRAPHY  2004   mildly depressed LV systolic fx EF 96%,EXBMWU coronaries/abdominal aorta/renal arteries.  . LOWER EXTREMITY ANGIOGRAM Left 11/17/2019   Procedure: Lower Extremity Fistulogram;  Surgeon: Marty Heck, MD;  Location: [place];  Service: Vascular;  Laterality: Left;  . NM [place]  02/21/2010   No ischemia or infarction.  EF 27%.  Marland Kitchen RADIOACTIVE SEED IMPLANT  2010   prostate cancer  . REVISION OF  ARTERIOVENOUS GORETEX GRAFT Right 07/11/2019   Procedure: REVISION OF ARTERIOVENOUS GORETEX GRAFT RIGHT ARM WITH ARTEGRAFT;  Surgeon: Serafina Mitchell, MD;  Location: [place];  Service: Vascular;  Laterality: Right;  . SHUNTOGRAM Right 07/14/2019   Procedure: Shuntogram;  Surgeon: Elam Dutch, MD;  Location: [place];  Service: Vascular;  Laterality: Right;  . THROMBECTOMY AND REVISION OF ARTERIOVENTOUS (AV) GORETEX  GRAFT Right 07/14/2019   Procedure: THROMBECTOMY AND REVISION OF ARTERIOVENTOUS (AV) GORETEX  GRAFT;  Surgeon: Elam Dutch, MD;  Location: [place];  Service: Vascular;  Laterality: Right;  . THROMBECTOMY W/ EMBOLECTOMY Left 02/26/2018   Procedure: THROMBECTOMY ARTERIOVENOUS GRAFT;  Surgeon: Angelia Mould, MD;  Location: [place];  Service: Vascular;  Laterality: Left;  . THROMBECTOMY W/ EMBOLECTOMY Left 11/17/2019   Procedure: THROMBECTOMY ARTERIOVENOUS GRAFT LEFT THIGH;  Surgeon: Marty Heck, MD;  Location: [place];  Service: Vascular;  Laterality: Left;  . TRANSTHORACIC ECHOCARDIOGRAM  02/2012   [facility]) EF 45-50% with mild global HK.  Mild LVH,LA mod. dilated,mild-mod. 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 AI.  Marland Kitchen TRANSTHORACIC ECHOCARDIOGRAM  9/'18; 3/'19   a) Severely reduced EF.  25 from 30%.  GR 1 DD with diffuse ecchymosis. Mild AS & AI.  Mild LA/RA dilation; b) (post PEA arest):   Moderately dilated LV with moderate concentric virtually.  EF 15 to 20%.  GR 1 DD.  Paradoxical septal motion. Mild AI.  Mild LA dilation.  Poorly visualized RV.  Marland Kitchen TRANSTHORACIC ECHOCARDIOGRAM  02/2019   EF 20-25%.  Unable to assess diastolic function (A. Fib).  No LV apical thrombus.  Mild AI.  Mildly reduced RV function, however poorly visualized.  Marland Kitchen ULTRASOUND GUIDANCE FOR VASCULAR ACCESS  02/20/2018   Procedure: Ultrasound Guidance For Vascular Access;  Surgeon: Jettie Booze, MD;  Location: [place] CV LAB;  Service: Cardiovascular;;  . UPPER  EXTREMITY VENOGRAPHY N/A 07/08/2018   Procedure: UPPER EXTREMITY VENOGRAPHY;  Surgeon: Waynetta Sandy, MD;  Location: [place] CV LAB;  Service: Cardiovascular;  Laterality: N/A;  Bilateral    Family History  Problem Relation Age of Onset  . Hypertension Mother   . Thyroid disease Mother   . Cholelithiasis Daughter   . Cholelithiasis Son   . Hypertension Maternal Grandmother   . Diabetes Maternal Grandmother   . Heart attack Neg Hx   . Hyperlipidemia Neg Hx     Allergies  Allergen Reactions  . Dextromethorphan-Guaifenesin Other (See Comments)    Unknown reaction  . Tocotrienols Other (See Comments)    Unknown reaction  . Losartan Potassium Other (See Comments)    Causes constipation    Current Outpatient Medications on File Prior to Visit  Medication Sig Dispense Refill  . acetaminophen (TYLENOL) 500 MG tablet Take 1,000 mg by mouth every 6 (six) hours as needed for moderate pain.    Marland Kitchen allopurinol (ZYLOPRIM) 100 MG tablet Take 1 tablet (100 mg total) by mouth See admin instructions. Take one tablet (100 mg) by mouth Mondays, Tuesday, Thursday, Saturday after dialysis 45 tablet 1  . amiodarone (PACERONE) 200 MG tablet Take 1 tablet (200 mg total) by mouth daily. (Patient taking differently: Take 200 mg by mouth daily at 12 noon. ) 90 tablet 3  . bictegravir-emtricitabine-tenofovir AF ([name]) 50-200-25 MG TABS tablet Take 1 tablet by mouth daily. (Patient taking differently: Take 1 tablet by mouth daily at 12 noon. ) 30 tablet 5  . brimonidine (ALPHAGAN) 0.15 % ophthalmic solution Place 1 drop into the right eye 2 (two) times daily.   3  . latanoprost (XALATAN) 0.005 % ophthalmic solution Place 1 drop into the right eye at bedtime.     Marland Kitchen loratadine (CLARITIN) 10 MG tablet Take 10 mg by mouth daily at 12 noon.     . midodrine (PROAMATINE) 10 MG tablet Take 1 tablet (10 mg total) by mouth as directed. Twice a day on the day of HD Tuesday, Thursday, Saturday (1 in AM and  1 at [place]). Take 1/2 tablet (5mg ) a day on non HD days (Patient taking differently: Take 10 mg by mouth See admin instructions. Take 20 mg in the morning of dialysis days and 10 mg half way through treatment on dialysis days (Mon, Tues, Thurs, and Sat)) 90 tablet 5  . montelukast (SINGULAIR) 10 MG tablet Take 1 tablet (10 mg total) by mouth at bedtime. NEEDS OV/FOLLOW UP 90 tablet 0  . Multiple Vitamin (MULTIVITAMIN WITH MINERALS) TABS tablet Take 1 tablet by mouth every evening.     . polyethylene glycol (MIRALAX / GLYCOLAX) 17 g packet Take 17 g by mouth every evening.    . sucroferric oxyhydroxide (VELPHORO) 500 MG chewable tablet Chew 500 mg by mouth 3 (three) times daily with meals.    . traMADol (ULTRAM) 50 MG tablet Take 1 tablet (50 mg total) by mouth every 6 (six) hours as needed. (Patient taking differently: Take 50 mg by mouth every 6 (six) hours as needed for moderate pain. ) 5 tablet 0  . warfarin (COUMADIN) 5 MG tablet Take 7.5mg  daily for next 3days and then check INR 6/18  0   No current facility-administered medications on file prior to visit.    BP 118/61 (BP Location: Left Arm, Patient Position: Sitting, Cuff Size: Small)   Pulse 77   Temp 97.8 F (36.6 C) (Temporal)   Resp 16   Ht 6\' 2"  (1.88 m)   Wt 171 lb (77.6 kg)   SpO2 100%   BMI 21.96 kg/m       Objective:   Physical  Exam Constitutional:      General: He is not in acute distress.    Appearance: He is well-developed.  HENT:     Head: Normocephalic and atraumatic.  Cardiovascular:     Rate and Rhythm: Normal rate and regular rhythm.     Heart sounds: No murmur heard.   Pulmonary:     Effort: Pulmonary effort is normal. No respiratory distress.     Breath sounds: Normal breath sounds. No wheezing or rales.  Skin:    General: Skin is warm and dry.  Neurological:     Mental Status: He is alert and oriented to person, place, and time.  Psychiatric:        Behavior: Behavior normal.        Thought  Content: Thought content normal.           Assessment & Plan:  ESRD- continues HD.  Clinically euvolemic today.  Transaminitis- clinically resolved. Obtain follow up LFT to ensure downward trend. Tolerating PO's.   Chronic anticoagulation due to AF- maintained on coumadin which has been followed by nephrology.  I will check a follow up level today while he is in the office.  This visit occurred during the SARS-CoV-2 public health emergency.  Safety protocols were in place, including screening questions prior to the visit, additional usage of staff PPE, and extensive cleaning of exam room while observing appropriate contact time as indicated for disinfecting solutions.

## 2020-05-25 ENCOUNTER — Telehealth: Payer: Self-pay | Admitting: *Deleted

## 2020-05-25 ENCOUNTER — Ambulatory Visit (INDEPENDENT_AMBULATORY_CARE_PROVIDER_SITE_OTHER): Payer: Medicare HMO | Admitting: Pharmacist

## 2020-05-25 ENCOUNTER — Other Ambulatory Visit: Payer: Self-pay

## 2020-05-25 ENCOUNTER — Encounter: Payer: Self-pay | Admitting: Family

## 2020-05-25 ENCOUNTER — Telehealth: Payer: Self-pay | Admitting: Family

## 2020-05-25 DIAGNOSIS — I482 Chronic atrial fibrillation, unspecified: Secondary | ICD-10-CM | POA: Diagnosis not present

## 2020-05-25 DIAGNOSIS — Z7901 Long term (current) use of anticoagulants: Secondary | ICD-10-CM | POA: Diagnosis not present

## 2020-05-25 DIAGNOSIS — K824 Cholesterolosis of gallbladder: Secondary | ICD-10-CM | POA: Diagnosis not present

## 2020-05-25 DIAGNOSIS — E8770 Fluid overload, unspecified: Secondary | ICD-10-CM | POA: Diagnosis not present

## 2020-05-25 DIAGNOSIS — Z992 Dependence on renal dialysis: Secondary | ICD-10-CM | POA: Diagnosis not present

## 2020-05-25 DIAGNOSIS — N186 End stage renal disease: Secondary | ICD-10-CM | POA: Diagnosis not present

## 2020-05-25 DIAGNOSIS — K8050 Calculus of bile duct without cholangitis or cholecystitis without obstruction: Secondary | ICD-10-CM | POA: Diagnosis not present

## 2020-05-25 DIAGNOSIS — N2581 Secondary hyperparathyroidism of renal origin: Secondary | ICD-10-CM | POA: Diagnosis not present

## 2020-05-25 DIAGNOSIS — R531 Weakness: Secondary | ICD-10-CM | POA: Diagnosis not present

## 2020-05-25 LAB — COMPREHENSIVE METABOLIC PANEL
ALT: 39 U/L (ref 0–53)
AST: 18 U/L (ref 0–37)
Albumin: 4.6 g/dL (ref 3.5–5.2)
Alkaline Phosphatase: 214 U/L — ABNORMAL HIGH (ref 39–117)
BUN: 34 mg/dL — ABNORMAL HIGH (ref 6–23)
CO2: 30 mEq/L (ref 19–32)
Calcium: 9.2 mg/dL (ref 8.4–10.5)
Chloride: 95 mEq/L — ABNORMAL LOW (ref 96–112)
Creatinine, Ser: 5.8 mg/dL (ref 0.40–1.50)
GFR: 11.5 mL/min — CL (ref >60.00)
Glucose, Bld: 126 mg/dL — ABNORMAL HIGH (ref 70–99)
Potassium: 3.8 mEq/L (ref 3.5–5.1)
Sodium: 137 mEq/L (ref 135–145)
Total Bilirubin: 0.5 mg/dL (ref 0.2–1.2)
Total Protein: 7.3 g/dL (ref 6.0–8.3)

## 2020-05-25 NOTE — Telephone Encounter (Signed)
Noted  

## 2020-05-25 NOTE — Telephone Encounter (Signed)
Thank you :)

## 2020-05-25 NOTE — Patient Outreach (Signed)
[name]) Care Management  05/25/2020  Radwan Cowley [place] [birth year] [id number]   Transition of Care Referral  Referral Date:05/19/2020 Referral Source:Hospital Liaison Date of Discharge:05/18/2020 Facility:[place] Insurance:[employer] Medicare PCP Office Does [facility]   Outreach attempt #3 to patient. No answer at present.      Plan: RN CM will make outreach attempt to patient within 3-4 weeks.    Enzo Montgomery, RN,BSN,CCM [place] Management Telephonic Care Management Coordinator Direct Phone: [phone] Toll Free: [phone] Fax: [phone]

## 2020-05-25 NOTE — Telephone Encounter (Signed)
CRITICAL VALUE STICKER  CRITICAL VALUE:  Creatinine  5.80,  GFR  11.5  RECEIVER (on-site recipient of call):Amiel Sharrow, [place] NOTIFIED:  05/25/20 @ 7:32  MESSENGER (representative from lab): [place]  MD NOTIFIED:  [name], NP  TIME OF NOTIFICATION:  RESPONSE:

## 2020-05-25 NOTE — Telephone Encounter (Signed)
Raquel, It looks like you are managing Mr. Zeiss INR in coumadin clinic. We drew his post-hospital INR yesterday and it was subtherapeutic at 1.7.  I will defer medication changes and follow up to you.  Please confirm when you receive my message.

## 2020-05-27 DIAGNOSIS — E8770 Fluid overload, unspecified: Secondary | ICD-10-CM | POA: Diagnosis not present

## 2020-05-27 DIAGNOSIS — N2581 Secondary hyperparathyroidism of renal origin: Secondary | ICD-10-CM | POA: Diagnosis not present

## 2020-05-27 DIAGNOSIS — N186 End stage renal disease: Secondary | ICD-10-CM | POA: Diagnosis not present

## 2020-05-27 DIAGNOSIS — Z992 Dependence on renal dialysis: Secondary | ICD-10-CM | POA: Diagnosis not present

## 2020-05-29 DIAGNOSIS — Z992 Dependence on renal dialysis: Secondary | ICD-10-CM | POA: Diagnosis not present

## 2020-05-29 DIAGNOSIS — N186 End stage renal disease: Secondary | ICD-10-CM | POA: Diagnosis not present

## 2020-05-29 DIAGNOSIS — N2581 Secondary hyperparathyroidism of renal origin: Secondary | ICD-10-CM | POA: Diagnosis not present

## 2020-05-29 DIAGNOSIS — E8770 Fluid overload, unspecified: Secondary | ICD-10-CM | POA: Diagnosis not present

## 2020-05-31 DIAGNOSIS — Z992 Dependence on renal dialysis: Secondary | ICD-10-CM | POA: Diagnosis not present

## 2020-05-31 DIAGNOSIS — N2581 Secondary hyperparathyroidism of renal origin: Secondary | ICD-10-CM | POA: Diagnosis not present

## 2020-05-31 DIAGNOSIS — N186 End stage renal disease: Secondary | ICD-10-CM | POA: Diagnosis not present

## 2020-05-31 DIAGNOSIS — E8770 Fluid overload, unspecified: Secondary | ICD-10-CM | POA: Diagnosis not present

## 2020-05-31 NOTE — Progress Notes (Signed)
Mailed out to pt 

## 2020-06-01 DIAGNOSIS — E8770 Fluid overload, unspecified: Secondary | ICD-10-CM | POA: Diagnosis not present

## 2020-06-01 DIAGNOSIS — N2581 Secondary hyperparathyroidism of renal origin: Secondary | ICD-10-CM | POA: Diagnosis not present

## 2020-06-01 DIAGNOSIS — Z992 Dependence on renal dialysis: Secondary | ICD-10-CM | POA: Diagnosis not present

## 2020-06-01 DIAGNOSIS — N186 End stage renal disease: Secondary | ICD-10-CM | POA: Diagnosis not present

## 2020-06-02 DIAGNOSIS — Z992 Dependence on renal dialysis: Secondary | ICD-10-CM | POA: Diagnosis not present

## 2020-06-02 DIAGNOSIS — B20 Human immunodeficiency virus [HIV] disease: Secondary | ICD-10-CM | POA: Diagnosis not present

## 2020-06-02 DIAGNOSIS — N186 End stage renal disease: Secondary | ICD-10-CM | POA: Diagnosis not present

## 2020-06-03 DIAGNOSIS — N186 End stage renal disease: Secondary | ICD-10-CM | POA: Diagnosis not present

## 2020-06-03 DIAGNOSIS — N2581 Secondary hyperparathyroidism of renal origin: Secondary | ICD-10-CM | POA: Diagnosis not present

## 2020-06-03 DIAGNOSIS — E8770 Fluid overload, unspecified: Secondary | ICD-10-CM | POA: Diagnosis not present

## 2020-06-03 DIAGNOSIS — Z992 Dependence on renal dialysis: Secondary | ICD-10-CM | POA: Diagnosis not present

## 2020-06-05 DIAGNOSIS — N2581 Secondary hyperparathyroidism of renal origin: Secondary | ICD-10-CM | POA: Diagnosis not present

## 2020-06-05 DIAGNOSIS — N186 End stage renal disease: Secondary | ICD-10-CM | POA: Diagnosis not present

## 2020-06-05 DIAGNOSIS — E8770 Fluid overload, unspecified: Secondary | ICD-10-CM | POA: Diagnosis not present

## 2020-06-05 DIAGNOSIS — Z992 Dependence on renal dialysis: Secondary | ICD-10-CM | POA: Diagnosis not present

## 2020-06-07 ENCOUNTER — Other Ambulatory Visit: Payer: Self-pay | Admitting: Infectious Disease

## 2020-06-07 DIAGNOSIS — N186 End stage renal disease: Secondary | ICD-10-CM | POA: Diagnosis not present

## 2020-06-07 DIAGNOSIS — N2581 Secondary hyperparathyroidism of renal origin: Secondary | ICD-10-CM | POA: Diagnosis not present

## 2020-06-07 DIAGNOSIS — E8770 Fluid overload, unspecified: Secondary | ICD-10-CM | POA: Diagnosis not present

## 2020-06-07 DIAGNOSIS — Z992 Dependence on renal dialysis: Secondary | ICD-10-CM | POA: Diagnosis not present

## 2020-06-08 ENCOUNTER — Other Ambulatory Visit: Payer: Self-pay

## 2020-06-08 DIAGNOSIS — N186 End stage renal disease: Secondary | ICD-10-CM | POA: Diagnosis not present

## 2020-06-08 DIAGNOSIS — Z992 Dependence on renal dialysis: Secondary | ICD-10-CM | POA: Diagnosis not present

## 2020-06-08 DIAGNOSIS — N2581 Secondary hyperparathyroidism of renal origin: Secondary | ICD-10-CM | POA: Diagnosis not present

## 2020-06-08 DIAGNOSIS — E8770 Fluid overload, unspecified: Secondary | ICD-10-CM | POA: Diagnosis not present

## 2020-06-08 NOTE — Patient Outreach (Signed)
[facility]) Care Management  06/08/2020  [facility] [place] 1941-05-14 [id number]   Transition of Care Referral  Referral Date:05/19/2020 Referral Source:Hospital Liaison Date of Discharge:05/18/2020 Facility:[place] Insurance:[employer] Medicare PCP Office Does [facility]   Outreach attempt #4 to patient. No answer at present.      Plan: RN CM will close case at this time.    Enzo Montgomery, RN,BSN,CCM [place] Management Telephonic Care Management Coordinator Direct Phone: [phone] Toll Free: [phone] Fax: [phone]

## 2020-06-10 ENCOUNTER — Ambulatory Visit: Payer: Self-pay

## 2020-06-10 DIAGNOSIS — N186 End stage renal disease: Secondary | ICD-10-CM | POA: Diagnosis not present

## 2020-06-10 DIAGNOSIS — N2581 Secondary hyperparathyroidism of renal origin: Secondary | ICD-10-CM | POA: Diagnosis not present

## 2020-06-10 DIAGNOSIS — E8770 Fluid overload, unspecified: Secondary | ICD-10-CM | POA: Diagnosis not present

## 2020-06-10 DIAGNOSIS — Z992 Dependence on renal dialysis: Secondary | ICD-10-CM | POA: Diagnosis not present

## 2020-06-11 ENCOUNTER — Other Ambulatory Visit: Payer: Self-pay | Admitting: Cardiovascular Disease

## 2020-06-12 DIAGNOSIS — Z992 Dependence on renal dialysis: Secondary | ICD-10-CM | POA: Diagnosis not present

## 2020-06-12 DIAGNOSIS — N186 End stage renal disease: Secondary | ICD-10-CM | POA: Diagnosis not present

## 2020-06-12 DIAGNOSIS — N2581 Secondary hyperparathyroidism of renal origin: Secondary | ICD-10-CM | POA: Diagnosis not present

## 2020-06-12 DIAGNOSIS — E8770 Fluid overload, unspecified: Secondary | ICD-10-CM | POA: Diagnosis not present

## 2020-06-14 ENCOUNTER — Telehealth: Payer: Self-pay

## 2020-06-14 DIAGNOSIS — N2581 Secondary hyperparathyroidism of renal origin: Secondary | ICD-10-CM | POA: Diagnosis not present

## 2020-06-14 DIAGNOSIS — N186 End stage renal disease: Secondary | ICD-10-CM | POA: Diagnosis not present

## 2020-06-14 DIAGNOSIS — Z992 Dependence on renal dialysis: Secondary | ICD-10-CM | POA: Diagnosis not present

## 2020-06-14 DIAGNOSIS — E8770 Fluid overload, unspecified: Secondary | ICD-10-CM | POA: Diagnosis not present

## 2020-06-14 NOTE — Telephone Encounter (Signed)
05/05/20: Palliative volunteer check in call patient feeling ok, seeing PCP

## 2020-06-15 ENCOUNTER — Other Ambulatory Visit: Payer: Self-pay

## 2020-06-15 DIAGNOSIS — Z992 Dependence on renal dialysis: Secondary | ICD-10-CM | POA: Diagnosis not present

## 2020-06-15 DIAGNOSIS — E8770 Fluid overload, unspecified: Secondary | ICD-10-CM | POA: Diagnosis not present

## 2020-06-15 DIAGNOSIS — N2581 Secondary hyperparathyroidism of renal origin: Secondary | ICD-10-CM | POA: Diagnosis not present

## 2020-06-15 DIAGNOSIS — N186 End stage renal disease: Secondary | ICD-10-CM | POA: Diagnosis not present

## 2020-06-17 DIAGNOSIS — N186 End stage renal disease: Secondary | ICD-10-CM | POA: Diagnosis not present

## 2020-06-17 DIAGNOSIS — E8770 Fluid overload, unspecified: Secondary | ICD-10-CM | POA: Diagnosis not present

## 2020-06-17 DIAGNOSIS — Z992 Dependence on renal dialysis: Secondary | ICD-10-CM | POA: Diagnosis not present

## 2020-06-17 DIAGNOSIS — N2581 Secondary hyperparathyroidism of renal origin: Secondary | ICD-10-CM | POA: Diagnosis not present

## 2020-06-18 ENCOUNTER — Encounter: Payer: Self-pay | Admitting: Cardiovascular Disease

## 2020-06-18 ENCOUNTER — Other Ambulatory Visit: Payer: Self-pay

## 2020-06-18 ENCOUNTER — Telehealth: Payer: Self-pay | Admitting: *Deleted

## 2020-06-18 ENCOUNTER — Ambulatory Visit (INDEPENDENT_AMBULATORY_CARE_PROVIDER_SITE_OTHER): Payer: Medicare HMO | Admitting: Cardiovascular Disease

## 2020-06-18 VITALS — BP 148/70 | HR 78 | Ht 74.0 in | Wt 190.6 lb

## 2020-06-18 DIAGNOSIS — I2510 Atherosclerotic heart disease of native coronary artery without angina pectoris: Secondary | ICD-10-CM

## 2020-06-18 DIAGNOSIS — I5042 Chronic combined systolic (congestive) and diastolic (congestive) heart failure: Secondary | ICD-10-CM

## 2020-06-18 DIAGNOSIS — Z5181 Encounter for therapeutic drug level monitoring: Secondary | ICD-10-CM | POA: Diagnosis not present

## 2020-06-18 DIAGNOSIS — I420 Dilated cardiomyopathy: Secondary | ICD-10-CM | POA: Diagnosis not present

## 2020-06-18 DIAGNOSIS — I480 Paroxysmal atrial fibrillation: Secondary | ICD-10-CM | POA: Diagnosis not present

## 2020-06-18 DIAGNOSIS — Z21 Asymptomatic human immunodeficiency virus [HIV] infection status: Secondary | ICD-10-CM

## 2020-06-18 DIAGNOSIS — Z7901 Long term (current) use of anticoagulants: Secondary | ICD-10-CM | POA: Diagnosis not present

## 2020-06-18 DIAGNOSIS — I447 Left bundle-branch block, unspecified: Secondary | ICD-10-CM | POA: Diagnosis not present

## 2020-06-18 DIAGNOSIS — E7800 Pure hypercholesterolemia, unspecified: Secondary | ICD-10-CM | POA: Diagnosis not present

## 2020-06-18 DIAGNOSIS — Z79899 Other long term (current) drug therapy: Secondary | ICD-10-CM

## 2020-06-18 MED ORDER — AMIODARONE HCL 200 MG PO TABS: ORAL_TABLET | ORAL | 3 refills | Status: DC

## 2020-06-18 NOTE — Telephone Encounter (Signed)
The patient came in for an appointment today stating that he could not fill his amiodarone because it was too soon. Call placed to the pharmacy and they will give his 5 pills for $6. The patient has been made aware.

## 2020-06-18 NOTE — Patient Instructions (Addendum)
Medication Instructions:  No changes *If you need a refill on your cardiac medications before your next appointment, please call your pharmacy*   Lab Work: Your provider would like for you to have the following labs: TSH and free T4  If you have labs (blood work) drawn today and your tests are completely normal, you will receive your results only by: Marland Kitchen MyChart Message (if you have MyChart) OR . A paper copy in the mail If you have any lab test that is abnormal or we need to change your treatment, we will call you to review the results.   Testing/Procedures: None ordered   Follow-Up: At [facility], you and your health needs are our priority.  As part of our continuing mission to provide you with exceptional heart care, we have created designated Provider Care Teams.  These Care Teams include your primary Cardiologist (physician) and Advanced Practice Providers (APPs -  Physician Assistants and Nurse Practitioners) who all work together to provide you with the care you need, when you need it.  We recommend signing up for the patient portal called "MyChart".  Sign up information is provided on this After Visit Summary.  MyChart is used to connect with patients for Virtual Visits (Telemedicine).  Patients are able to view lab/test results, encounter notes, upcoming appointments, etc.  Non-urgent messages can be sent to your provider as well.   To learn more about what you can do with MyChart, go to [email or web address].    Your next appointment:   6 month(s)  The format for your next appointment:   In Person  Provider:   You may see [name], MD or one of the following Advanced Practice Providers on your designated Care Team:    Almyra Deforest, PA-C  Fabian Sharp, PA-C or   Roby Lofts, [place]

## 2020-06-18 NOTE — Progress Notes (Signed)
Patient ID: David [name], male   DOB: 1941/06/03, 79 y.o.   MRN: [id number]    Cardiology Office Note    Date:  06/20/2020   ID:  [address] [place], [place] 02/03/1941, MRN [id number]  PCP:  David Alar, NP  Cardiologist:   David Klein, MD   Chief Complaint  Patient presents with  . Congestive Heart Failure    History of Present Illness:  [name] is a 79 y.o. male with CHF (systolic and diastolic), PAFib, history of PEA arrest, nonischemic dilated cardiomyopathy and ESRD.  He continues to have occasional problems with hypotension during dialysis, but has not had syncope and has been able to complete his dialysis sessions most of the time.  He has not had any problems with palpitations.  He denies angina at rest, with activity or during dialysis.  He has not had a lot of issues with shortness of breath but remains quite sedentary.  He does denies orthopnea and PND and does not have lower extremity edema.  He was hospitalized in June with abdominal pain and abnormal liver function tests due to choledocholithiasis, resolved spontaneously without ERCP.  MRCP was negative for a filling defect but showed "sludge" in his biliary system.  He had rate controlled atrial fibrillation during that visit, converted to sinus rhythm by the time of discharge.  David [name] had a major setback in January when he was diagnosed with COVID-19.  Hospitalized for almost a month from January 7 until February 5.  He received remdesivir and dexamethasone.  Palliative care was recommended and he was switched to DNR status.  Subsequently discharged to skilled nursing at [facility].  Since then he has had a visit with Dr. Tommy [name].  David [name] decided to rescind his DNR status and is again full code.  He was rehospitalized on February 20 with acute pulmonary edema and atrial fibrillation with rapid ventricular response.  He had lost a lot of weight during his COVID-19 infection and his dry weight was  reassessed to a lower value (78 kg)..  He was treated with intravenous amiodarone and subsequent oral amiodarone.  After hemodialysis on the day of admission he converted to sinus rhythm, suggesting that the episode of atrial fibrillation was triggered by heart failure decompensation and not vice versa.  He is on chronic warfarin anticoagulation.  He continues to undergo hemodialysis 4 days a week (Monday Tuesday Thursday and Saturday) with abbreviated 3.5-hour sessions.  This allows removal of a lower volume each time and has allowed him to complete dialysis sessions and not be hospitalized with heart failure.  He takes a high dose of midodrine (10 mg in the morning and 10 mg at noon) on dialysis days and takes only 5 mg once daily on other days of the week.  Currently getting dialysis via a right subclavian permacath.  He has occluded dialysis access sites in both upper extremities.  His new dry weight has been set at 85 kg.  He carefully limits his intake of fluid between dialysis sessions.  Low blood pressure prevents use of conventional heart failure medications.  Most recent ejection fraction was estimated at 20-25%.  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  Most recently his ejection fraction has been estimated to be 15-20%.  Coronary angiography after his cardiac arrest showed only minor  atherosclerosis without significant stenotic lesions.  He has an atypical left bundle branch block.   He is HIV positive, with undetectable viral load on antiretroviral therapy, monitored by Dr. Tommy [name].     He has had multiple failed AV fistulas and is now receiving dialysis via a right subclavian permacath.   Past Medical History:  Diagnosis Date  . 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 Acute on  chronic systolic and diastolic heart failure, NYHA class 4 ([place])   . Anemia, iron deficiency 11/15/2011  . Arthritis    "hands, right knee, feet" (02/21/2018)  . Atrial fibrillation ([place])   . Cardiac arrest ([place]) 02/17/2018  . CHF (congestive heart failure) ([place])   . Chronic combined systolic and diastolic heart failure, NYHA class 3 ([place]) 06/2010   felt to be secondary to hypertensive cardiomyopathy  . Chronic lower back pain   . CKD (chronic kidney disease) stage V requiring chronic dialysis ([place])   . COVID-19 12/2019  . ESRD on dialysis [facility])    started 02/2018 [place]  . GERD (gastroesophageal reflux disease)   . Glaucoma    "I had surgery and dont have it any more"  . Gout    daily RX (02/21/2018)  . Heart murmur    "mild" per pt  . Hepatitis    years ago  . HIV (human immunodeficiency virus infection) ([place])   . HIV infection ([place])   . Hyperlipidemia   . Hypertension   . Nonischemic cardiomyopathy ([place])   . Pneumonia 11/2017  . Prostate cancer ([place])    hx of prostate; s/p radioactive seed implant 10/2009 Dr Janice Norrie  . Sinus bradycardia   . Sleep apnea    does not use a cpap  . Stroke [facility])    "mini stroke" years ago  . Wears glasses     Past Surgical History:  Procedure Laterality Date  . AV FISTULA PLACEMENT Right 10/13/2016   Procedure: ARTERIOVENOUS (AV) FISTULA CREATION;  Surgeon: [name], MD;  Location: [place];  Service: Vascular;  Laterality: Right;  . AV FISTULA PLACEMENT Left 02/25/2018   Procedure: INSERTION OF ARTERIOVENOUS (AV) GORE-TEX GRAFT LEFT UPPER ARM;  Surgeon: Angelia Mould, MD;  Location: [place];  Service: Vascular;  Laterality: Left;  . AV FISTULA PLACEMENT Right 08/07/2018   Procedure: Creation of right arm brachiocephalic Fistula;  Surgeon: Waynetta Sandy, MD;  Location: [place];  Service: Vascular;  Laterality: Right;  . AV FISTULA PLACEMENT Left 11/10/2019   Procedure: INSERTION OF ARTERIOVENOUS  (AV) GORE-TEX GRAFT THIGH;  Surgeon: Elam Dutch, MD;  Location: [place];  Service: Vascular;  Laterality: Left;  . AV FISTULA PLACEMENT Right 12/02/2019   Procedure: INSERTION OF ARTERIOVENOUS (AV) GORE-TEX GRAFT RIGHT  THIGH;  Surgeon: Waynetta Sandy, MD;  Location: [place];  Service: Vascular;  Laterality: Right;  . BASCILIC VEIN TRANSPOSITION Left 06/24/2015   Procedure: BASILIC VEIN TRANSPOSITION;  Surgeon: Mal Misty, MD;  Location: [place];  Service: Vascular;  Laterality: Left;  . BIOPSY  03/14/2019   Procedure: BIOPSY;  Surgeon: Irene Shipper, MD;  Location: [place];  Service: Endoscopy;;  . CATARACT EXTRACTION, BILATERAL Bilateral   . COLONOSCOPY WITH PROPOFOL N/A 03/14/2019   Procedure: COLONOSCOPY WITH PROPOFOL;  Surgeon: Irene Shipper, MD;  Location: [facility] ENDOSCOPY;  Service: Endoscopy;  Laterality: N/A;  . ESOPHAGOGASTRODUODENOSCOPY (EGD) WITH PROPOFOL N/A 05/05/2014   Procedure: ESOPHAGOGASTRODUODENOSCOPY (EGD) WITH PROPOFOL;  Surgeon: Missy Sabins, MD;  Location: [facility] ENDOSCOPY;  Service: Endoscopy;  Laterality: N/A;  . EYE SURGERY Bilateral    cataract surgery   . GLAUCOMA SURGERY Bilateral   . INSERTION OF ARTERIOVENOUS (AV) ARTEGRAFT ARM  10/09/2018   Procedure: INSERTION OF ARTERIOVENOUS (AV) 44mm x 41cm ARTEGRAFT LEFT UPPER ARM;  Surgeon: Waynetta Sandy, MD;  Location: [place];  Service: Vascular;;  . INSERTION OF DIALYSIS CATHETER Right 07/14/2019   Procedure: Insertion Of Dialysis Catheter;  Surgeon: Elam Dutch, MD;  Location: [facility] OR;  Service: Vascular;  Laterality: Right;  placed right Internal Jugular  . INSERTION PROSTATE RADIATION SEED    . IR FLUORO GUIDE CV LINE RIGHT  02/18/2018  . IR REMOVAL TUN CV CATH W/O FL  12/06/2018  . IR THROMBECTOMY AV FISTULA W/THROMBOLYSIS/PTA INC/SHUNT/IMG RIGHT Right 01/26/2020  . IR THROMBECTOMY AV FISTULA W/THROMBOLYSIS/PTA INC/SHUNT/IMG RIGHT Right 02/24/2020  . IR THROMBECTOMY AV FISTULA W/THROMBOLYSIS/PTA  INC/SHUNT/IMG RIGHT Right 04/07/2020  . IR US GUIDE VASC ACCESS RIGHT  02/18/2018  . IR US GUIDE VASC ACCESS RIGHT  01/26/2020  . IR US GUIDE VASC ACCESS RIGHT  04/07/2020  . LEFT HEART CATH AND CORONARY ANGIOGRAPHY N/A 02/20/2018   Procedure: LEFT HEART CATH AND CORONARY ANGIOGRAPHY;  Surgeon: Jettie Booze, MD;  Location: [place] CV LAB;;; 50% ostial OM2, 25% mLAD.  Marland Kitchen LEFT HEART CATH AND CORONARY ANGIOGRAPHY  2004   mildly depressed LV systolic fx EF 97%,[id number] coronaries/abdominal aorta/renal arteries.  . LOWER EXTREMITY ANGIOGRAM Left 11/17/2019   Procedure: Lower Extremity Fistulogram;  Surgeon: Marty Heck, MD;  Location: [place];  Service: Vascular;  Laterality: Left;  . NM [place]  02/21/2010   No ischemia or infarction.  EF 27%.  Marland Kitchen RADIOACTIVE SEED IMPLANT  2010   prostate cancer  . REVISION OF ARTERIOVENOUS GORETEX GRAFT Right 07/11/2019   Procedure: REVISION OF ARTERIOVENOUS GORETEX GRAFT RIGHT ARM WITH ARTEGRAFT;  Surgeon: Serafina Mitchell, MD;  Location: [place];  Service: Vascular;  Laterality: Right;  . SHUNTOGRAM Right 07/14/2019   Procedure: Shuntogram;  Surgeon: Elam Dutch, MD;  Location: [place];  Service: Vascular;  Laterality: Right;  . THROMBECTOMY AND REVISION OF ARTERIOVENTOUS (AV) GORETEX  GRAFT Right 07/14/2019   Procedure: THROMBECTOMY AND REVISION OF ARTERIOVENTOUS (AV) GORETEX  GRAFT;  Surgeon: Elam Dutch, MD;  Location: [place];  Service: Vascular;  Laterality: Right;  . THROMBECTOMY W/ EMBOLECTOMY Left 02/26/2018   Procedure: THROMBECTOMY ARTERIOVENOUS GRAFT;  Surgeon: Angelia Mould, MD;  Location: [place];  Service: Vascular;  Laterality: Left;  . THROMBECTOMY W/ EMBOLECTOMY Left 11/17/2019   Procedure: THROMBECTOMY ARTERIOVENOUS GRAFT LEFT THIGH;  Surgeon: Marty Heck, MD;  Location: [place];  Service: Vascular;  Laterality: Left;  . TRANSTHORACIC ECHOCARDIOGRAM  02/2012   [facility]) EF 45-50% with mild global HK.   Mild LVH,LA mod. dilated,mild-mod. 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 AI.  Marland Kitchen TRANSTHORACIC ECHOCARDIOGRAM  9/'18; 3/'19   a) Severely reduced EF.  25 from 30%.  GR 1 DD with diffuse ecchymosis. Mild AS & AI.  Mild LA/RA dilation; b) (post PEA arest):   Moderately dilated LV with moderate concentric virtually.  EF 15 to 20%.  GR 1 DD.  Paradoxical septal motion. Mild AI.  Mild LA dilation.  Poorly visualized RV.  Marland Kitchen TRANSTHORACIC ECHOCARDIOGRAM  02/2019   EF 20-25%.  Unable to assess diastolic function (A. Fib).  No LV apical thrombus.  Mild AI.  Mildly reduced  RV function, however poorly visualized.  Marland Kitchen ULTRASOUND GUIDANCE FOR VASCULAR ACCESS  02/20/2018   Procedure: Ultrasound Guidance For Vascular Access;  Surgeon: Jettie Booze, MD;  Location: [place] CV LAB;  Service: Cardiovascular;;  . UPPER EXTREMITY VENOGRAPHY N/A 07/08/2018   Procedure: UPPER EXTREMITY VENOGRAPHY;  Surgeon: Waynetta Sandy, MD;  Location: [place] CV LAB;  Service: Cardiovascular;  Laterality: N/A;  Bilateral    Current Outpatient Medications  Medication Sig Dispense Refill  . acetaminophen (TYLENOL) 500 MG tablet Take 1,000 mg by mouth every 6 (six) hours as needed for moderate pain.    Marland Kitchen allopurinol (ZYLOPRIM) 100 MG tablet Take 1 tablet (100 mg total) by mouth See admin instructions. Take one tablet (100 mg) by mouth Mondays, Tuesday, Thursday, Saturday after dialysis 45 tablet 1  . amiodarone (PACERONE) 200 MG tablet TAKE 1 TABLET(200 MG) BY MOUTH DAILY 90 tablet 3  . [name] 50-200-25 MG TABS tablet TAKE 1 TABLET BY MOUTH DAILY 30 tablet 0  . brimonidine (ALPHAGAN) 0.15 % ophthalmic solution Place 1 drop into the right eye 2 (two) times daily.   3  . latanoprost (XALATAN) 0.005 % ophthalmic solution Place 1 drop into the right eye at bedtime.     Marland Kitchen loratadine (CLARITIN) 10 MG tablet Take 10 mg by mouth daily at 12 noon.     . midodrine (PROAMATINE) 10 MG tablet Take  1 tablet (10 mg total) by mouth as directed. Twice a day on the day of HD Tuesday, Thursday, Saturday (1 in AM and 1 at [place]). Take 1/2 tablet (5mg ) a day on non HD days (Patient taking differently: Take 10 mg by mouth See admin instructions. Take 20 mg in the morning of dialysis days and 10 mg half way through treatment on dialysis days (Mon, Tues, Thurs, and Sat)) 90 tablet 5  . montelukast (SINGULAIR) 10 MG tablet Take 1 tablet (10 mg total) by mouth at bedtime. NEEDS OV/FOLLOW UP 90 tablet 0  . Multiple Vitamin (MULTIVITAMIN WITH MINERALS) TABS tablet Take 1 tablet by mouth every evening.     . polyethylene glycol (MIRALAX / GLYCOLAX) 17 g packet Take 17 g by mouth every evening.    . sucroferric oxyhydroxide (VELPHORO) 500 MG chewable tablet Chew 500 mg by mouth 3 (three) times daily with meals.    . traMADol (ULTRAM) 50 MG tablet Take 1 tablet (50 mg total) by mouth every 6 (six) hours as needed. (Patient taking differently: Take 50 mg by mouth every 6 (six) hours as needed for moderate pain. ) 5 tablet 0  . warfarin (COUMADIN) 5 MG tablet Take 7.5mg  daily for next 3days and then check INR 6/18  0   No current facility-administered medications for this visit.    Allergies:   Dextromethorphan-guaifenesin, Tocotrienols, and Losartan potassium   Social History   Socioeconomic History  . Marital status: Married    Spouse name: Not on file  . Number of children: 2  . Years of education: 33  . Highest education level: Not on file  Occupational History  . 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 Smoking status: Former Smoker    Packs/day: 1.00    Years: 30.00    Pack years: 30.00    Types: Cigarettes    Quit date: 2006    Years since quitting: 15.5  . Smokeless tobacco: Never Used  Vaping Use  . Vaping Use: Never used  Substance and Sexual  Activity  . Alcohol use: Yes    Alcohol/week: 1.0 standard drink    Types: 1 Shots of liquor per week     Comment: occasional  . Drug use: Never  . Sexual activity: Not Currently    Comment: declined condoms 09/2019  Other Topics Concern  . 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   Social Determinants of Health   Financial Resource Strain:   . Difficulty of Paying Living Expenses:   Food Insecurity: No Food Insecurity  . Worried About Charity fundraiser in the Last Year: Never true  . Ran Out of Food in the Last Year: Never true  Transportation Needs: No Transportation Needs  . Lack of Transportation (Medical): No  . Lack of Transportation (Non-Medical): No  Physical Activity:   . Days of Exercise per Week:   . Minutes of Exercise per Session:   Stress:   . Feeling of Stress :   Social Connections:   . Frequency of Communication with Friends and Family:   . Frequency of Social Gatherings with Friends and Family:   . Attends Religious Services:   . Active Member of Clubs or Organizations:   . Attends Archivist Meetings:   Marland Kitchen Marital Status:      Family History:  The patient's family history includes Cholelithiasis in his daughter and son; Diabetes in his maternal grandmother; Hypertension in his maternal grandmother and mother; Thyroid disease in his mother.   ROS:   Please see the history of present illness.    All other systems are reviewed and are negative.   PHYSICAL EXAM:   VS:  BP (!) 148/70   Pulse 78   Ht 6\' 2"  (1.88 m)   Wt 190 lb 9.6 oz (86.5 kg)   SpO2 100%   BMI 24.47 kg/m      General: Alert, oriented x3, no distress, he appears weak and chronically ill.  Left subclavian dialysis catheter  Head: no evidence of trauma, PERRL, EOMI, no exophtalmos or lid lag, no myxedema, no xanthelasma; normal ears, nose and oropharynx Neck: normal jugular  venous pulsations and no hepatojugular reflux; brisk carotid pulses without delay and no carotid bruits Chest: clear to auscultation, no signs of consolidation by percussion or palpation, normal fremitus, symmetrical and full respiratory excursions Cardiovascular: normal position and quality of the apical impulse, regular rhythm, normal first and paradoxically split second heart sounds, no murmurs, rubs or gallops Abdomen: no tenderness or distention, no masses by palpation, no abnormal pulsatility or arterial bruits, normal bowel sounds, no hepatosplenomegaly Extremities: no clubbing, cyanosis or edema; 2+ radial, ulnar and brachial pulses bilaterally; 2+ right femoral, posterior tibial and dorsalis pedis pulses; 2+ left femoral, posterior tibial and dorsalis pedis pulses; no subclavian or femoral bruits Neurological: grossly nonfocal Psych: Normal mood and affect   Wt Readings from Last 3 Encounters:  06/18/20 190 lb 9.6 oz (86.5 kg)  05/24/20 171 lb (77.6 kg)  05/18/20 175 lb 14.8 oz (79.8 kg)      Studies/Labs Reviewed:   EKG:  EKG ordered today shows sinus bradycardia, first-degree AV block, left bundle branch block with left axis deviation, QTC 492 ms Recent Labs: 12/11/2019:  TSH 0.294 01/09/2020: B Natriuretic Peptide 641.5; Magnesium 1.7 05/18/2020: Hemoglobin 9.5; Platelets 176 05/24/2020: ALT 39; BUN 34; Creatinine, Ser 5.80; Potassium 3.8; Sodium 137   Lipid Panel    Component Value Date/Time   CHOL 122 01/08/2020 0117   CHOL 197 12/31/2017 0945   TRIG 29 01/08/2020 0117   HDL 44 01/08/2020 0117   HDL 47 12/31/2017 0945   CHOLHDL 2.8 01/08/2020 0117   VLDL 6 01/08/2020 0117   LDLCALC 72 01/08/2020 0117   LDLCALC 145 (H) 12/18/2018 0930     ASSESSMENT:    1. Chronic combined systolic and diastolic heart failure, NYHA class 3 (HCC)   2. Paroxysmal atrial fibrillation ([place])   3. Nonischemic dilated cardiomyopathy ([place])   4. Coronary artery disease involving native  coronary artery of native heart without angina pectoris   5. Hypercholesterolemia   6. LBBB (left bundle branch block)   7. Asymptomatic HIV infection ([place])   8. Encounter for monitoring amiodarone therapy   9. Long term (current) use of anticoagulants      PLAN:  In order of problems listed above:  1. CHF: Clinically euvolemic, very close to what is estimated to be his "dry weight" at dialysis.  Functional status remains poor, but has improved following recovery from his COVID-19 infection early in the year.  Most recent LVEF 20-25%.   2. CMP: Primarily nonischemic cardiomyopathy with mild nonobstructive CAD.  Unable to tolerate heart failure medications due to low blood pressure. 3. CAD: Denies angina pectoris.  The risk factors mostly addressed. 4. HLP: LDL cholesterol very close to target range. 5. AFib: On warfarin anticoagulation and amiodarone antiarrhythmic therapy.Ronita Hipps (age 68, HF, history of hypertension) 6. LBBB: Despite the very broad QRS he is not a good candidate for CRT due to end-stage renal disease and extensive limitations to dialysis access. 7. ESRD: Has done well with a 4 times weekly dialysis schedule.  Dry weight now established at 85 kg. 8. HIV: Followed by Dr. Drucilla Schmidt with well suppressed viral load.  Most recent CD4 406 in February 2021. 9. Amiodarone: Liver function tests were moderately abnormal secondary to biliary obstruction when checked in early June, but had normalized by June 21.  TSH borderline suppressed in early 2021: Suspect euthyroid sick syndrome in January when he had COVID-19 infection.  Time to recheck his thyroid function studies (we will try to recheck when he goes to see Dr. Tommy [name] next week). 10. Warfarin: No serious bleeding complications.  INR has recently been subtherapeutic.  Medication Adjustments/Labs and Tests Ordered: Current medicines are reviewed at length with the patient today.  Concerns regarding medicines are outlined above.   Medication changes, Labs and Tests ordered today are listed below. Patient Instructions  Medication Instructions:  No changes *If you need a refill on your cardiac medications before your next appointment, please call your pharmacy*   Lab Work: Your provider would like for you to have the following labs: TSH and free T4  If you have labs (blood work) drawn today and your tests are completely normal, you will receive your results only by: Marland Kitchen MyChart Message (if you have MyChart) OR . A paper copy in the mail If you have any lab test that is abnormal or we need to change your treatment, we will call you to review the results.   Testing/Procedures: None ordered   Follow-Up: At [facility], you and your health needs are our priority.  As part of our continuing mission to provide  you with exceptional heart care, we have created designated Provider Care Teams.  These Care Teams include your primary Cardiologist (physician) and Advanced Practice Providers (APPs -  Physician Assistants and Nurse Practitioners) who all work together to provide you with the care you need, when you need it.  We recommend signing up for the patient portal called "MyChart".  Sign up information is provided on this After Visit Summary.  MyChart is used to connect with patients for Virtual Visits (Telemedicine).  Patients are able to view lab/test results, encounter notes, upcoming appointments, etc.  Non-urgent messages can be sent to your provider as well.   To learn more about what you can do with MyChart, go to [email or web address].    Your next appointment:   6 month(s)  The format for your next appointment:   In Person  Provider:   You may see David Klein, MD or one of the following Advanced Practice Providers on your designated Care Team:    Almyra Deforest, PA-C  Fabian Sharp, [place] or   Roby Lofts, PA-C        Signed, David Klein, MD  06/20/2020 2:26 PM    [address], [place], [place]  [address] Phone: [phone]; Fax: [phone]

## 2020-06-19 DIAGNOSIS — N186 End stage renal disease: Secondary | ICD-10-CM | POA: Diagnosis not present

## 2020-06-19 DIAGNOSIS — Z992 Dependence on renal dialysis: Secondary | ICD-10-CM | POA: Diagnosis not present

## 2020-06-19 DIAGNOSIS — E8770 Fluid overload, unspecified: Secondary | ICD-10-CM | POA: Diagnosis not present

## 2020-06-19 DIAGNOSIS — N2581 Secondary hyperparathyroidism of renal origin: Secondary | ICD-10-CM | POA: Diagnosis not present

## 2020-06-20 ENCOUNTER — Encounter: Payer: Self-pay | Admitting: Cardiovascular Disease

## 2020-06-21 ENCOUNTER — Other Ambulatory Visit: Payer: Self-pay

## 2020-06-21 DIAGNOSIS — N186 End stage renal disease: Secondary | ICD-10-CM | POA: Diagnosis not present

## 2020-06-21 DIAGNOSIS — E039 Hypothyroidism, unspecified: Secondary | ICD-10-CM

## 2020-06-21 DIAGNOSIS — E8770 Fluid overload, unspecified: Secondary | ICD-10-CM | POA: Diagnosis not present

## 2020-06-21 DIAGNOSIS — N2581 Secondary hyperparathyroidism of renal origin: Secondary | ICD-10-CM | POA: Diagnosis not present

## 2020-06-21 DIAGNOSIS — Z992 Dependence on renal dialysis: Secondary | ICD-10-CM | POA: Diagnosis not present

## 2020-06-22 DIAGNOSIS — H04123 Dry eye syndrome of bilateral lacrimal glands: Secondary | ICD-10-CM | POA: Diagnosis not present

## 2020-06-22 DIAGNOSIS — Z992 Dependence on renal dialysis: Secondary | ICD-10-CM | POA: Diagnosis not present

## 2020-06-22 DIAGNOSIS — E8770 Fluid overload, unspecified: Secondary | ICD-10-CM | POA: Diagnosis not present

## 2020-06-22 DIAGNOSIS — H44422 Hypotony of left eye due to ocular fistula: Secondary | ICD-10-CM | POA: Diagnosis not present

## 2020-06-22 DIAGNOSIS — N186 End stage renal disease: Secondary | ICD-10-CM | POA: Diagnosis not present

## 2020-06-22 DIAGNOSIS — H401133 Primary open-angle glaucoma, bilateral, severe stage: Secondary | ICD-10-CM | POA: Diagnosis not present

## 2020-06-22 DIAGNOSIS — Z961 Presence of intraocular lens: Secondary | ICD-10-CM | POA: Diagnosis not present

## 2020-06-22 DIAGNOSIS — N2581 Secondary hyperparathyroidism of renal origin: Secondary | ICD-10-CM | POA: Diagnosis not present

## 2020-06-23 ENCOUNTER — Telehealth: Payer: Self-pay

## 2020-06-23 NOTE — Telephone Encounter (Signed)
duplicate

## 2020-06-23 NOTE — Telephone Encounter (Signed)
Studies have shown low bleed rates in patients taking Eliquis on dialysis using normal dosing criteria. He would qualify for 5mg  BID dosing due to age and renal function. I agree this would be a safe long term option, although the optimal duration of Eliquis hold prior to his procedure might be a little tricky. The bleed risk of the procedure seems to be low - could see if they are ok with pt just holding Eliquis for 1 day prior to procedure if you're comfortable with this? If so, I can reach out to pt to see if he is ok with $45 Eliquis copay and transition him over.

## 2020-06-23 NOTE — Telephone Encounter (Signed)
Personally, I wouldn't mind if they do not stop it at all, but 1-2 days should be acceptable and definitely less risky than a long break in warfarin

## 2020-06-23 NOTE — Telephone Encounter (Signed)
   [name] Pre-operative Risk Assessment    Request for surgical clearance:  1. What type of surgery is being performed? BLEB NEEDLING  OD(right eye)  2. When is this surgery scheduled? TBD   3. What type of clearance is required (medical clearance vs. Pharmacy clearance to hold med vs. Both)? BOTH  4. Are there any medications that need to be held prior to surgery and how long? PT TAKES WARFARIN   5. Practice name and name of physician performing surgery? [facility].   6. What is the office phone number? [phone]   7.   What is the office fax number? [phone]  8.   Anesthesia type (None, local, MAC, general) ? LOCAL   David [name] 06/23/2020, 1:56 PM

## 2020-06-23 NOTE — Telephone Encounter (Signed)
Patient with diagnosis of afib on warfarin for anticoagulation.    Procedure: bleb needling of the right eye Date of procedure: TBD  CHADS2-VASc score of 7 (age x2, CHF, HTN, CAD, stroke), pt also on dialysis.  Typically would recommend bridging with Lovenox if pt needs to hold warfarin for 5 days prior to procedure due to elevated CHADS2VASc score including stroke history. However, since pt is also on dialysis and Lovenox has more limited data in this population (and Hgb has been trending in the 9 range), will route to MD for input.

## 2020-06-23 NOTE — Telephone Encounter (Signed)
Ideally this will be done without stopping warfarin. I would be afraid of bleeding with enoxaparin and dialysis. This might be a good opportunity to switch him to Eliquis. What do you think, [name]?

## 2020-06-24 ENCOUNTER — Encounter: Payer: Self-pay | Admitting: *Deleted

## 2020-06-24 DIAGNOSIS — E8770 Fluid overload, unspecified: Secondary | ICD-10-CM | POA: Diagnosis not present

## 2020-06-24 DIAGNOSIS — N2581 Secondary hyperparathyroidism of renal origin: Secondary | ICD-10-CM | POA: Diagnosis not present

## 2020-06-24 DIAGNOSIS — N186 End stage renal disease: Secondary | ICD-10-CM | POA: Diagnosis not present

## 2020-06-24 DIAGNOSIS — Z992 Dependence on renal dialysis: Secondary | ICD-10-CM | POA: Diagnosis not present

## 2020-06-24 MED ORDER — APIXABAN 5 MG PO TABS: 5.0000 mg | ORAL_TABLET | Freq: Two times a day (BID) | ORAL | 5 refills | Status: DC

## 2020-06-24 NOTE — Progress Notes (Signed)
I connected with David [name] today by telephone and verified that I am speaking with the correct person using two identifiers. Location patient: home Location provider: work Persons participating in the virtual visit: patient, Marine scientist.    I discussed the limitations, risks, security and privacy concerns of performing an evaluation and management service by telephone and the availability of in person appointments. I also discussed with the patient that there may be a patient responsible charge related to this service. The patient expressed understanding and verbally consented to this telephonic visit.    Interactive audio and video telecommunications were attempted between this provider and patient, however failed, due to patient having technical difficulties OR patient did not have access to video capability.  We continued and completed visit with audio only.  Some vital signs may be absent or patient reported.     Subjective:   David [name] is a 79 y.o. male who presents for Medicare Annual/Subsequent preventive examination.  Review of Systems    Cardiac Risk Factors include: advanced age (>56men, >90 women);male gender     Objective:       Advanced Directives 06/25/2020 05/15/2020 04/07/2020 01/24/2020 12/12/2019 12/11/2019 11/30/2019  Does Patient Have a Medical Advance Directive? Yes Yes Yes No Yes Yes No  Type of Paramedic of [place];Living will Healthcare Power of Attorney Living will - Living will Living will -  Does patient want to make changes to medical advance directive? No - Patient declined No - Patient declined No - Guardian declined Yes (ED - Information included in AVS) No - Patient declined No - Patient declined -  Copy of [place] in Chart? No - copy requested No - copy requested - - - - -  Would patient like information on creating a medical advance directive? - - - Yes (ED - Information included in AVS) No - Patient declined  No - Patient declined No - Patient declined  Pre-existing out of facility DNR order (yellow form or pink MOST form) - - - - - - -    Current Medications (verified) Outpatient Encounter Medications as of 06/25/2020  Medication Sig   acetaminophen (TYLENOL) 500 MG tablet Take 1,000 mg by mouth every 6 (six) hours as needed for moderate pain.   allopurinol (ZYLOPRIM) 100 MG tablet Take 1 tablet (100 mg total) by mouth See admin instructions. Take one tablet (100 mg) by mouth Mondays, Tuesday, Thursday, Saturday after dialysis   amiodarone (PACERONE) 200 MG tablet TAKE 1 TABLET(200 MG) BY MOUTH DAILY   apixaban (ELIQUIS) 5 MG TABS tablet Take 1 tablet (5 mg total) by mouth 2 (two) times daily.   [name] 50-200-25 MG TABS tablet TAKE 1 TABLET BY MOUTH DAILY   brimonidine (ALPHAGAN) 0.15 % ophthalmic solution Place 1 drop into the right eye 2 (two) times daily.    latanoprost (XALATAN) 0.005 % ophthalmic solution Place 1 drop into the right eye at bedtime.    loratadine (CLARITIN) 10 MG tablet Take 10 mg by mouth daily at 12 noon.    midodrine (PROAMATINE) 10 MG tablet Take 1 tablet (10 mg total) by mouth as directed. Twice a day on the day of HD Tuesday, Thursday, Saturday (1 in AM and 1 at [place]). Take 1/2 tablet (5mg ) a day on non HD days (Patient taking differently: Take 10 mg by mouth See admin instructions. Take 20 mg in the morning of dialysis days and 10 mg half way through treatment on dialysis days (Mon, Tues, [place],  and Sat))   montelukast (SINGULAIR) 10 MG tablet Take 1 tablet (10 mg total) by mouth at bedtime. NEEDS OV/FOLLOW UP   Multiple Vitamin (MULTIVITAMIN WITH MINERALS) TABS tablet Take 1 tablet by mouth every evening.    polyethylene glycol (MIRALAX / GLYCOLAX) 17 g packet Take 17 g by mouth every evening.   sucroferric oxyhydroxide (VELPHORO) 500 MG chewable tablet Chew 500 mg by mouth 3 (three) times daily with meals.   traMADol (ULTRAM) 50 MG tablet Take 1 tablet  (50 mg total) by mouth every 6 (six) hours as needed. (Patient taking differently: Take 50 mg by mouth every 6 (six) hours as needed for moderate pain. )   No facility-administered encounter medications on file as of 06/25/2020.    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   Chronic lower back pain    CKD (chronic kidney disease) stage V requiring chronic dialysis ([place])    COVID-19 12/2019   ESRD on dialysis [facility])    started 02/2018 [place]   GERD (gastroesophageal reflux disease)    Glaucoma    "I had surgery and dont have it any more"   Gout    daily RX (02/21/2018)   Heart murmur    "mild" per pt   Hepatitis    years ago   HIV (human immunodeficiency virus infection) ([place])    HIV infection ([place])    Hyperlipidemia    Hypertension    Nonischemic cardiomyopathy ([place])    Pneumonia 11/2017   Prostate cancer ([place])    hx of prostate; s/p radioactive seed implant 10/2009 Dr Janice Norrie   Sinus bradycardia    Sleep apnea    does not use a cpap   Stroke [facility])    "mini stroke" years ago   Wears glasses    Past Surgical History:  Procedure Laterality Date   AV FISTULA PLACEMENT Right 10/13/2016   Procedure: ARTERIOVENOUS (AV) FISTULA CREATION;  Surgeon: [name], MD;  Location: [place];  Service: Vascular;  Laterality: Right;   AV FISTULA PLACEMENT Left 02/25/2018   Procedure: INSERTION OF  ARTERIOVENOUS (AV) GORE-TEX GRAFT LEFT UPPER ARM;  Surgeon: Angelia Mould, MD;  Location: [place];  Service: Vascular;  Laterality: Left;   AV FISTULA PLACEMENT Right 08/07/2018   Procedure: Creation of right arm brachiocephalic Fistula;  Surgeon: Waynetta Sandy, MD;  Location: [place];  Service: Vascular;  Laterality: Right;   AV FISTULA PLACEMENT Left 11/10/2019   Procedure: INSERTION OF ARTERIOVENOUS (AV) GORE-TEX GRAFT THIGH;  Surgeon: Elam Dutch, MD;  Location: [place];  Service: Vascular;  Laterality: Left;   AV FISTULA PLACEMENT Right 12/02/2019   Procedure: INSERTION OF ARTERIOVENOUS (AV) GORE-TEX GRAFT RIGHT  THIGH;  Surgeon: Waynetta Sandy, MD;  Location: [place];  Service: Vascular;  Laterality: Right;   [place] Left 06/24/2015   Procedure: BASILIC VEIN TRANSPOSITION;  Surgeon: Mal Misty, MD;  Location: [place];  Service: Vascular;  Laterality: Left;   BIOPSY  03/14/2019   Procedure: BIOPSY;  Surgeon: Irene Shipper, MD;  Location: [facility] ENDOSCOPY;  Service: Endoscopy;;   CATARACT EXTRACTION, BILATERAL Bilateral    COLONOSCOPY WITH PROPOFOL N/A 03/14/2019   Procedure: COLONOSCOPY WITH PROPOFOL;  Surgeon: Irene Shipper, MD;  Location: [name] ENDOSCOPY;  Service: Endoscopy;  Laterality: N/A;   ESOPHAGOGASTRODUODENOSCOPY (EGD) WITH PROPOFOL N/A 05/05/2014   Procedure: ESOPHAGOGASTRODUODENOSCOPY (EGD) WITH PROPOFOL;  Surgeon: Missy Sabins, MD;  Location: [facility] ENDOSCOPY;  Service: Endoscopy;  Laterality: N/A;   EYE SURGERY Bilateral    cataract surgery    GLAUCOMA SURGERY Bilateral    INSERTION OF ARTERIOVENOUS (AV) ARTEGRAFT ARM  10/09/2018   Procedure: INSERTION OF ARTERIOVENOUS (AV) 59mm x 41cm ARTEGRAFT LEFT UPPER ARM;  Surgeon: Waynetta Sandy, MD;  Location: [place];  Service: Vascular;;   INSERTION OF DIALYSIS CATHETER Right 07/14/2019   Procedure: Insertion Of Dialysis Catheter;  Surgeon: Elam Dutch, MD;  Location: [facility] OR;   Service: Vascular;  Laterality: Right;  placed right Internal Jugular   INSERTION PROSTATE RADIATION SEED     IR FLUORO GUIDE CV LINE RIGHT  02/18/2018   IR REMOVAL TUN CV CATH W/O FL  12/06/2018   IR THROMBECTOMY AV FISTULA W/THROMBOLYSIS/PTA INC/SHUNT/IMG RIGHT Right 01/26/2020   IR THROMBECTOMY AV FISTULA W/THROMBOLYSIS/PTA INC/SHUNT/IMG RIGHT Right 02/24/2020   IR THROMBECTOMY AV FISTULA W/THROMBOLYSIS/PTA INC/SHUNT/IMG RIGHT Right 04/07/2020   IR US GUIDE VASC ACCESS RIGHT  02/18/2018   IR US GUIDE VASC ACCESS RIGHT  01/26/2020   IR US GUIDE VASC ACCESS RIGHT  04/07/2020   LEFT HEART CATH AND CORONARY ANGIOGRAPHY N/A 02/20/2018   Procedure: LEFT HEART CATH AND CORONARY ANGIOGRAPHY;  Surgeon: Jettie Booze, MD;  Location: [facility] INVASIVE CV LAB;;; 50% ostial OM2, 25% mLAD.   LEFT HEART CATH AND CORONARY ANGIOGRAPHY  2004   mildly depressed LV systolic fx EF 62%,VOJJKK coronaries/abdominal aorta/renal arteries.   LOWER EXTREMITY ANGIOGRAM Left 11/17/2019   Procedure: Lower Extremity Fistulogram;  Surgeon: Marty Heck, MD;  Location: [place];  Service: Vascular;  Laterality: Left;   NM MYOCAR PERF WALL MOTION  02/21/2010   No ischemia or infarction.  EF 27%.   RADIOACTIVE SEED IMPLANT  2010   prostate cancer   REVISION OF ARTERIOVENOUS GORETEX GRAFT Right 07/11/2019   Procedure: REVISION OF ARTERIOVENOUS GORETEX GRAFT RIGHT ARM WITH ARTEGRAFT;  Surgeon: Serafina Mitchell, MD;  Location: [place];  Service: Vascular;  Laterality: Right;   SHUNTOGRAM Right 07/14/2019   Procedure: [place] Mal[place];  Surgeon: Elam Dutch, MD;  Location: [place];  Service: Vascular;  Laterality: Right;   THROMBECTOMY AND REVISION OF ARTERIOVENTOUS (AV) GORETEX  GRAFT Right 07/14/2019   Procedure: THROMBECTOMY AND REVISION OF ARTERIOVENTOUS (AV) GORETEX  GRAFT;  Surgeon: Elam Dutch, MD;  Location: [place];  Service: Vascular;  Laterality: Right;   THROMBECTOMY W/ EMBOLECTOMY Left 02/26/2018    Procedure: THROMBECTOMY ARTERIOVENOUS GRAFT;  Surgeon: Angelia Mould, MD;  Location: [place];  Service: Vascular;  Laterality: Left;   THROMBECTOMY W/ EMBOLECTOMY Left 11/17/2019   Procedure: THROMBECTOMY ARTERIOVENOUS GRAFT LEFT THIGH;  Surgeon: Marty Heck, MD;  Location: [place];  Service: Vascular;  Laterality: Left;   TRANSTHORACIC ECHOCARDIOGRAM  02/2012   ([place]) EF 45-50% with mild global HK.  Mild LVH,LA mod. dilated,mild-mod. 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 AI.   TRANSTHORACIC ECHOCARDIOGRAM  9/'18; 3/'19   a) Severely reduced EF.  25  from 30%.  GR 1 DD with diffuse ecchymosis. Mild AS & AI.  Mild LA/RA dilation; b) (post PEA arest):   Moderately dilated LV with moderate concentric virtually.  EF 15 to 20%.  GR 1 DD.  Paradoxical septal motion. Mild AI.  Mild LA dilation.  Poorly visualized RV.   TRANSTHORACIC ECHOCARDIOGRAM  02/2019   EF 20-25%.  Unable to assess diastolic function (A. Fib).  No LV apical thrombus.  Mild AI.  Mildly reduced RV function, however poorly visualized.   ULTRASOUND GUIDANCE FOR VASCULAR ACCESS  02/20/2018   Procedure: Ultrasound Guidance For Vascular Access;  Surgeon: Jettie Booze, MD;  Location: [place] CV LAB;  Service: Cardiovascular;;   UPPER EXTREMITY VENOGRAPHY N/A 07/08/2018   Procedure: UPPER EXTREMITY VENOGRAPHY;  Surgeon: Waynetta Sandy, MD;  Location: [place] CV LAB;  Service: Cardiovascular;  Laterality: N/A;  Bilateral   Family History  Problem Relation Age of Onset   Hypertension Mother    Thyroid disease Mother    Cholelithiasis Daughter    Cholelithiasis Son    Hypertension Maternal Grandmother    Diabetes Maternal Grandmother    Heart attack Neg Hx    Hyperlipidemia Neg Hx    Social History   Socioeconomic History   Marital status: Married    Spouse name: Not on file   Number of children: 2   Years of education: 12   Highest education level:  Not on file  Occupational History   Occupation: retired, picks up Aeronautical engineer: RETIRED  Tobacco Use   Smoking status: Former Smoker    Packs/day: 1.00    Years: 30.00    Pack years: 30.00    Types: Cigarettes    Quit date: 2006    Years since quitting: 15.5   Smokeless tobacco: Never Used  Scientific laboratory technician Use: Never used  Substance and Sexual Activity   Alcohol use: Yes    Alcohol/week: 1.0 standard drink    Types: 1 Shots of liquor per week    Comment: occasional   Drug use: Never   Sexual activity: Not Currently    Comment: declined condoms 09/2019  Other Topics Concern   Not on file  Social History Narrative   Retired, married   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   Social Determinants of Health   Financial Resource Strain: Low Risk    Difficulty of Paying Living Expenses: Not hard at all  Food Insecurity: No Food Insecurity   Worried About Charity fundraiser in the Last Year: Never true   [place] in the Last Year: Never true  Transportation Needs: No Transportation Needs   Lack of Transportation (Medical): No   Lack of Transportation (Non-Medical): No  Physical Activity:    Days of Exercise per Week:    Minutes of Exercise per Session:   Stress:    Feeling of Stress :   Social Connections:    Frequency of Communication with Friends and Family:    Frequency of Social Gatherings with Friends and Family:    Attends Religious Services:    Active Member of Clubs or Organizations:    Attends Archivist Meetings:    Marital Status:  Tobacco Counseling Counseling given: Not Answered   Clinical Intake: Pain : No/denies pain     Activities of Daily Living In your present state of health, do you have any difficulty performing the following activities:  06/25/2020 05/15/2020  Hearing? N N  Vision? N N  Difficulty concentrating or making decisions? N N  Walking or climbing stairs? N Y  Dressing or bathing? N N  Doing errands, shopping? Y N  Preparing Food and eating ? Y -  Using the Toilet? N -  In the past six months, have you accidently leaked urine? N -  Do you have problems with loss of bowel control? N -  Managing your Medications? N -  Managing your Finances? Y -  Housekeeping or managing your Housekeeping? Y -  Some recent data might be hidden    Patient Care Team: [name], NP as PCP - General (Internal Medicine) Tommy Medal, Lavell Islam, MD as PCP - Infectious Diseases (Infectious Diseases) Croitoru, Dani Gobble, MD as PCP - Cardiology (Cardiology) Clent Jacks, MD (Ophthalmology) Lady Gary, [place] any recent Medical Services you may have received from other than [facility] providers in the past year (date may be approximate).     Assessment:   This is a routine wellness examination for [place].  Dietary issues and exercise activities discussed: Current Exercise Habits: Home exercise routine, Type of exercise: stretching, Time (Minutes): 10, Frequency (Times/Week): 7, Weekly Exercise (Minutes/Week): 70, Intensity: Mild, Exercise limited by: Other - see comments Diet (meal preparation, eat out, water intake, caffeinated beverages, dairy products, fruits and vegetables): renal     Goals     maintain health      Depression Screen PHQ 2/9 Scores 06/25/2020 02/02/2020 09/29/2019 09/10/2019 04/02/2019 01/17/2019 07/03/2018  PHQ - 2 Score 0 1 0 0 0 0 0    Fall Risk Fall Risk  06/25/2020 02/02/2020 09/29/2019 09/04/2019 04/02/2019  Falls in the past year? 1 1 1 1  0  Number falls in past yr: 0 1 0 1 0  Injury with Fall? 0 0 1 0 0  Risk for fall due to : Impaired balance/gait Impaired mobility;History of fall(s);Impaired balance/gait Impaired balance/gait;Impaired mobility History of fall(s);Impaired  balance/gait;Impaired mobility;Impaired vision -  Follow up Education provided;Falls prevention discussed Falls evaluation completed Falls evaluation completed;Education provided Education provided;Falls evaluation completed -   Lives w/ wife and dog in 1 story home.  Any stairs in or around the home? Yes  If so, are there any without handrails? No  Home free of loose throw rugs in walkways, pet beds, electrical cords, etc? Yes  Adequate lighting in your home to reduce risk of falls? Yes   ASSISTIVE DEVICES UTILIZED TO PREVENT FALLS:  Life alert? No  Use of a cane, walker or w/c? Yes  Grab bars in the bathroom? Yes  Shower chair or bench in shower? Yes  Elevated toilet seat or a handicapped toilet? No     Cognitive Function:    MMSE - Mini Mental State Exam 06/25/2020 01/17/2019  Not completed: Refused -  Orientation to time - 5  Orientation to Place - 5  Registration - 3  Attention/ Calculation - 5  Recall - 1  Language- name 2 objects - 2  Language- repeat - 0  Language- follow 3 step command - 3  Language- read & follow direction - 1  Write a sentence - 1  Copy design - 1  Total score - 27  Immunizations Immunization History  Administered Date(s) Administered   Fluad Quad(high Dose 65+) 08/07/2019   Hepatitis A, Adult 05/20/2014, 06/26/2016   Hepatitis B, adult 05/20/2014, 06/26/2016, 03/28/2018, 04/25/2018, 06/01/2018, 09/26/2018, 01/29/2020   Influenza Split 08/23/2011, 10/04/2012   Influenza Whole 10/04/2010   Influenza, High Dose Seasonal PF 09/26/2013, 09/24/2015, 08/12/2018   Influenza,inj,Quad PF,6+ Mos 07/29/2014, 10/03/2017   Moderna SARS-COVID-2 Vaccination 03/18/2020   Pneumococcal Conjugate-13 03/25/2018   Pneumococcal Polysaccharide-23 12/26/2010, 06/26/2016   Td 12/26/2010   Zoster 08/23/2011    TDAP status: Up to date Flu Vaccine status: Up to date Pneumococcal vaccine status: Up to date   Qualifies for Shingles Vaccine?  Yes   Zostavax completed Yes     Screening Tests Health Maintenance  Topic Date Due   INFLUENZA VACCINE  07/04/2020   TETANUS/TDAP  12/26/2020   COVID-19 Vaccine  Completed   Hepatitis C Screening  Completed   PNA vac Low Risk Adult  Completed    Health Maintenance  There are no preventive care reminders to display for this patient.  Pt declines colon cancer screen    Additional Screening:  Vision Screening: Recommended annual ophthalmology exams for early detection of glaucoma and other disorders of the eye. Is the patient up to date with their annual eye exam?  Yes  Who is the provider or what is the name of the office in which the patient attends annual eye exams? Dr.Groat.   Dental Screening: Recommended annual dental exams for proper oral hygiene  Community Resource Referral / Chronic Care Management: CRR required this visit?  No   CCM required this visit?  No      Plan:    Please schedule your next medicare wellness visit with me in 1 yr.  Continue to eat heart healthy diet (full of fruits, vegetables, whole grains, lean protein, water--limit salt, fat, and sugar intake) and increase physical activity as tolerated.  Continue doing brain stimulating activities (puzzles, reading, adult coloring books, staying active) to keep memory sharp.   Bring a copy of your living will and/or healthcare power of attorney to your next office visit.   I have personally reviewed and noted the following in the patients chart:    Medical and social history  Use of alcohol, tobacco or illicit drugs   Current medications and supplements  Functional ability and status  Nutritional status  Physical activity  Advanced directives  List of other physicians  Hospitalizations, surgeries, and ER visits in previous 12 months  Vitals  Screenings to include cognitive, depression, and falls  Referrals and appointments  In addition, I have reviewed and discussed  with patient certain preventive protocols, quality metrics, and best practice recommendations. A written personalized care plan for preventive services as well as general preventive health recommendations were provided to patient.   Due to this being a telephonic visit, the after visit summary with patients personalized plan was offered to patient via mail or my-chart.  Patient would like to access on my-chart.   David [name], [place]   06/25/2020

## 2020-06-24 NOTE — Telephone Encounter (Addendum)
Called pt to discuss changing to Eliquis. He is ok with this change. Rx sent to pharmacy, 1 month copay lower than expected at $26. Also activated copay card to make first month free. He qualifies for Eliquis 5mg  BID based on age and weight.  Pt is also overdue for INR which is needed to transition him to Eliquis. He states he went to dialysis this morning and thinks they checked his INR. Called [facility] [phone], they did check an INR but states it will take a few days for result. Will wait for INR result before changing pt from warfarin to Eliquis.  Then, will plan to have pt hold Eliquis for just 1 day prior to upcoming procedure.

## 2020-06-25 ENCOUNTER — Other Ambulatory Visit: Payer: Self-pay

## 2020-06-25 ENCOUNTER — Encounter: Payer: Self-pay | Admitting: *Deleted

## 2020-06-25 ENCOUNTER — Ambulatory Visit (INDEPENDENT_AMBULATORY_CARE_PROVIDER_SITE_OTHER): Payer: Medicare HMO | Admitting: *Deleted

## 2020-06-25 DIAGNOSIS — Z0000 Encounter for general adult medical examination without abnormal findings: Secondary | ICD-10-CM | POA: Diagnosis not present

## 2020-06-25 NOTE — Telephone Encounter (Signed)
Called [facility] dialysis [facility]. INR has not resulted yet. They state it most likely will not be back until tomorrow.

## 2020-06-25 NOTE — Patient Instructions (Signed)
Please schedule your next medicare wellness visit with me in 1 yr.  Continue to eat heart healthy diet (full of fruits, vegetables, whole grains, lean protein, water--limit salt, fat, and sugar intake) and increase physical activity as tolerated.  Continue doing brain stimulating activities (puzzles, reading, adult coloring books, staying active) to keep memory sharp.   Bring a copy of your living will and/or healthcare power of attorney to your next office visit.   David [name] , Thank you for taking time to come for your Medicare Wellness Visit. I appreciate your ongoing commitment to your health goals. Please review the following plan we discussed and let me know if I can assist you in the future.   These are the goals we discussed: Goals    . maintain health       This is a list of the screening recommended for you and due dates:  Health Maintenance  Topic Date Due  . Flu Shot  07/04/2020  . Tetanus Vaccine  12/26/2020  . COVID-19 Vaccine  Completed  .  Hepatitis C: One time screening is recommended by Center for Disease Control  (CDC) for  adults born from [birth year] through 1964.   Completed  . Pneumonia vaccines  Completed    Preventive Care 78 Years and Older, Male Preventive care refers to lifestyle choices and visits with your health care provider that can promote health and wellness. This includes:  A yearly physical exam. This is also called an annual well check.  Regular dental and eye exams.  Immunizations.  Screening for certain conditions.  Healthy lifestyle choices, such as diet and exercise. What can I expect for my preventive care visit? Physical exam Your health care provider will check:  Height and weight. These may be used to calculate body mass index (BMI), which is a measurement that tells if you are at a healthy weight.  Heart rate and blood pressure.  Your skin for abnormal spots. Counseling Your health care provider may ask you questions  about:  Alcohol, tobacco, and drug use.  Emotional well-being.  Home and relationship well-being.  Sexual activity.  Eating habits.  History of falls.  Memory and ability to understand (cognition).  Work and work Statistician. What immunizations do I need?  Influenza (flu) vaccine  This is recommended every year. Tetanus, diphtheria, and pertussis (Tdap) vaccine  You may need a Td booster every 10 years. Varicella (chickenpox) vaccine  You may need this vaccine if you have not already been vaccinated. Zoster (shingles) vaccine  You may need this after age 37. Pneumococcal conjugate (PCV13) vaccine  One dose is recommended after age 12. Pneumococcal polysaccharide (PPSV23) vaccine  One dose is recommended after age 62. Measles, mumps, and rubella (MMR) vaccine  You may need at least one dose of MMR if you were born in 1957 or later. You may also need a second dose. Meningococcal conjugate (MenACWY) vaccine  You may need this if you have certain conditions. Hepatitis A vaccine  You may need this if you have certain conditions or if you travel or work in places where you may be exposed to hepatitis A. Hepatitis B vaccine  You may need this if you have certain conditions or if you travel or work in places where you may be exposed to hepatitis B. Haemophilus influenzae type b (Hib) vaccine  You may need this if you have certain conditions. You may receive vaccines as individual doses or as more than one vaccine together in one shot (  combination vaccines). Talk with your health care provider about the risks and benefits of combination vaccines. What tests do I need? Blood tests  Lipid and cholesterol levels. These may be checked every 5 years, or more frequently depending on your overall health.  Hepatitis C test.  Hepatitis B test. Screening  Lung cancer screening. You may have this screening every year starting at age 49 if you have a 30-pack-year history of  smoking and currently smoke or have quit within the past 15 years.  Colorectal cancer screening. All adults should have this screening starting at age 47 and continuing until age 17. Your health care provider may recommend screening at age 65 if you are at increased risk. You will have tests every 1-10 years, depending on your results and the type of screening test.  Prostate cancer screening. Recommendations will vary depending on your family history and other risks.  Diabetes screening. This is done by checking your blood sugar (glucose) after you have not eaten for a while (fasting). You may have this done every 1-3 years.  Abdominal aortic aneurysm (AAA) screening. You may need this if you are a current or former smoker.  Sexually transmitted disease (STD) testing. Follow these instructions at home: Eating and drinking  Eat a diet that includes fresh fruits and vegetables, whole grains, lean protein, and low-fat dairy products. Limit your intake of foods with high amounts of sugar, saturated fats, and salt.  Take vitamin and mineral supplements as recommended by your health care provider.  Do not drink alcohol if your health care provider tells you not to drink.  If you drink alcohol: ? Limit how much you have to 0-2 drinks a day. ? Be aware of how much alcohol is in your drink. In the [place] one drink equals one 12 oz bottle of beer (355 mL), one 5 oz glass of wine (148 mL), or one 1 oz glass of hard liquor (44 mL). Lifestyle  Take daily care of your teeth and gums.  Stay active. Exercise for at least 30 minutes on 5 or more days each week.  Do not use any products that contain nicotine or tobacco, such as cigarettes, e-cigarettes, and chewing tobacco. If you need help quitting, ask your health care provider.  If you are sexually active, practice safe sex. Use a condom or other form of protection to prevent STIs (sexually transmitted infections).  Talk with your health care  provider about taking a low-dose aspirin or statin. What's next?  Visit your health care provider once a year for a well check visit.  Ask your health care provider how often you should have your eyes and teeth checked.  Stay up to date on all vaccines. This information is not intended to replace advice given to you by your health care provider. Make sure you discuss any questions you have with your health care provider. Document Revised: 11/14/2018 Document Reviewed: 11/14/2018 Elsevier Patient Education  2020 [employer].

## 2020-06-26 DIAGNOSIS — N186 End stage renal disease: Secondary | ICD-10-CM | POA: Diagnosis not present

## 2020-06-26 DIAGNOSIS — N2581 Secondary hyperparathyroidism of renal origin: Secondary | ICD-10-CM | POA: Diagnosis not present

## 2020-06-26 DIAGNOSIS — E8770 Fluid overload, unspecified: Secondary | ICD-10-CM | POA: Diagnosis not present

## 2020-06-26 DIAGNOSIS — Z992 Dependence on renal dialysis: Secondary | ICD-10-CM | POA: Diagnosis not present

## 2020-06-26 NOTE — Telephone Encounter (Signed)
Called dialysis again. INR on 7/22 was 1.9. called pt and left VM. Will plan to have him take no more coumadin and start Eliquis tomorrow AM.   Patient called back. Advised him not to take any more warfarin. Has not taken today. Start Eliquis tomorrow AM. copay card for first month free called in so patient sats he can pick up today.

## 2020-06-28 ENCOUNTER — Ambulatory Visit: Payer: Medicare HMO

## 2020-06-28 ENCOUNTER — Other Ambulatory Visit: Payer: Self-pay

## 2020-06-28 ENCOUNTER — Other Ambulatory Visit: Payer: Medicare HMO

## 2020-06-28 DIAGNOSIS — Z992 Dependence on renal dialysis: Secondary | ICD-10-CM | POA: Diagnosis not present

## 2020-06-28 DIAGNOSIS — N2581 Secondary hyperparathyroidism of renal origin: Secondary | ICD-10-CM | POA: Diagnosis not present

## 2020-06-28 DIAGNOSIS — I5042 Chronic combined systolic (congestive) and diastolic (congestive) heart failure: Secondary | ICD-10-CM

## 2020-06-28 DIAGNOSIS — E039 Hypothyroidism, unspecified: Secondary | ICD-10-CM | POA: Diagnosis not present

## 2020-06-28 DIAGNOSIS — E8770 Fluid overload, unspecified: Secondary | ICD-10-CM | POA: Diagnosis not present

## 2020-06-28 DIAGNOSIS — A429 Actinomycosis, unspecified: Secondary | ICD-10-CM

## 2020-06-28 DIAGNOSIS — I447 Left bundle-branch block, unspecified: Secondary | ICD-10-CM | POA: Diagnosis not present

## 2020-06-28 DIAGNOSIS — I482 Chronic atrial fibrillation, unspecified: Secondary | ICD-10-CM

## 2020-06-28 DIAGNOSIS — U071 COVID-19: Secondary | ICD-10-CM

## 2020-06-28 DIAGNOSIS — I132 Hypertensive heart and chronic kidney disease with heart failure and with stage 5 chronic kidney disease, or end stage renal disease: Secondary | ICD-10-CM

## 2020-06-28 DIAGNOSIS — N186 End stage renal disease: Secondary | ICD-10-CM | POA: Diagnosis not present

## 2020-06-28 DIAGNOSIS — B20 Human immunodeficiency virus [HIV] disease: Secondary | ICD-10-CM | POA: Diagnosis not present

## 2020-06-28 DIAGNOSIS — Z7189 Other specified counseling: Secondary | ICD-10-CM

## 2020-06-28 NOTE — Telephone Encounter (Signed)
Pt changed to Eliquis and should hold for 1 day prior to upcoming procedure.

## 2020-06-29 DIAGNOSIS — N186 End stage renal disease: Secondary | ICD-10-CM | POA: Diagnosis not present

## 2020-06-29 DIAGNOSIS — E8770 Fluid overload, unspecified: Secondary | ICD-10-CM | POA: Diagnosis not present

## 2020-06-29 DIAGNOSIS — Z992 Dependence on renal dialysis: Secondary | ICD-10-CM | POA: Diagnosis not present

## 2020-06-29 DIAGNOSIS — N2581 Secondary hyperparathyroidism of renal origin: Secondary | ICD-10-CM | POA: Diagnosis not present

## 2020-06-29 LAB — T-HELPER CELL (CD4) - (RCID CLINIC ONLY)
CD4 % Helper T Cell: 33 % (ref 33–65)
CD4 T Cell Abs: 469 /uL (ref 400–1790)

## 2020-06-29 NOTE — Telephone Encounter (Signed)
   Primary Cardiologist: [name], MD  Chart reviewed as part of pre-operative protocol coverage. Given past medical history and time since last visit, based on ACC/AHA guidelines, [facility] would be at acceptable risk for the planned procedure without further cardiovascular testing.   OK to hold Eliquis one day pre op- resume as soon as possible post op.   I will route this recommendation to the requesting party via Epic fax function and remove from pre-op pool.  Please call with questions.  Kerin Ransom, PA-C 06/29/2020, 9:00 AM

## 2020-07-01 DIAGNOSIS — N2581 Secondary hyperparathyroidism of renal origin: Secondary | ICD-10-CM | POA: Diagnosis not present

## 2020-07-01 DIAGNOSIS — N186 End stage renal disease: Secondary | ICD-10-CM | POA: Diagnosis not present

## 2020-07-01 DIAGNOSIS — Z992 Dependence on renal dialysis: Secondary | ICD-10-CM | POA: Diagnosis not present

## 2020-07-01 DIAGNOSIS — E8770 Fluid overload, unspecified: Secondary | ICD-10-CM | POA: Diagnosis not present

## 2020-07-02 LAB — RPR TITER: RPR Titer: 1:2 {titer} — ABNORMAL HIGH

## 2020-07-02 LAB — CBC WITH DIFFERENTIAL/PLATELET
Absolute Monocytes: 550 cells/uL (ref 200–950)
Basophils Absolute: 80 cells/uL (ref 0–200)
Basophils Relative: 1.7 %
Eosinophils Absolute: 583 cells/uL — ABNORMAL HIGH (ref 15–500)
Eosinophils Relative: 12.4 %
HCT: 35.6 % — ABNORMAL LOW (ref 38.5–50.0)
Hemoglobin: 12.1 g/dL — ABNORMAL LOW (ref 13.2–17.1)
Lymphs Abs: 1391 cells/uL (ref 850–3900)
MCH: 36.3 pg — ABNORMAL HIGH (ref 27.0–33.0)
MCHC: 34 g/dL (ref 32.0–36.0)
MCV: 106.9 fL — ABNORMAL HIGH (ref 80.0–100.0)
MPV: 10.5 fL (ref 7.5–12.5)
Monocytes Relative: 11.7 %
Neutro Abs: 2096 cells/uL (ref 1500–7800)
Neutrophils Relative %: 44.6 %
Platelets: 183 10*3/uL (ref 140–400)
RBC: 3.33 10*6/uL — ABNORMAL LOW (ref 4.20–5.80)
RDW: 13.2 % (ref 11.0–15.0)
Total Lymphocyte: 29.6 %
WBC: 4.7 10*3/uL (ref 3.8–10.8)

## 2020-07-02 LAB — LIPID PANEL
Cholesterol: 194 mg/dL (ref <200)
HDL: 55 mg/dL (ref >=40)
LDL Cholesterol (Calc): 115 mg/dL (calc) — ABNORMAL HIGH
Non-HDL Cholesterol (Calc): 139 mg/dL (calc) — ABNORMAL HIGH (ref <130)
Total CHOL/HDL Ratio: 3.5 (calc) (ref <5.0)
Triglycerides: 126 mg/dL (ref <150)

## 2020-07-02 LAB — HIV-1 RNA QUANT-NO REFLEX-BLD
HIV 1 RNA Quant: <20 copies/mL
HIV-1 RNA Quant, Log: <1.3 Log copies/mL

## 2020-07-02 LAB — FLUORESCENT TREPONEMAL AB(FTA)-IGG-BLD: Fluorescent Treponemal ABS: REACTIVE — AB

## 2020-07-02 LAB — RPR: RPR Ser Ql: REACTIVE — AB

## 2020-07-02 LAB — TSH: TSH: 1.16 mIU/L (ref 0.40–4.50)

## 2020-07-02 LAB — T4, FREE: Free T4: 1.7 ng/dL (ref 0.8–1.8)

## 2020-07-03 DIAGNOSIS — N2581 Secondary hyperparathyroidism of renal origin: Secondary | ICD-10-CM | POA: Diagnosis not present

## 2020-07-03 DIAGNOSIS — Z992 Dependence on renal dialysis: Secondary | ICD-10-CM | POA: Diagnosis not present

## 2020-07-03 DIAGNOSIS — B20 Human immunodeficiency virus [HIV] disease: Secondary | ICD-10-CM | POA: Diagnosis not present

## 2020-07-03 DIAGNOSIS — E8770 Fluid overload, unspecified: Secondary | ICD-10-CM | POA: Diagnosis not present

## 2020-07-03 DIAGNOSIS — N186 End stage renal disease: Secondary | ICD-10-CM | POA: Diagnosis not present

## 2020-07-05 ENCOUNTER — Other Ambulatory Visit: Payer: Self-pay | Admitting: Cardiovascular Disease

## 2020-07-05 DIAGNOSIS — N2581 Secondary hyperparathyroidism of renal origin: Secondary | ICD-10-CM | POA: Diagnosis not present

## 2020-07-05 DIAGNOSIS — N186 End stage renal disease: Secondary | ICD-10-CM | POA: Diagnosis not present

## 2020-07-05 DIAGNOSIS — Z992 Dependence on renal dialysis: Secondary | ICD-10-CM | POA: Diagnosis not present

## 2020-07-05 DIAGNOSIS — E8770 Fluid overload, unspecified: Secondary | ICD-10-CM | POA: Diagnosis not present

## 2020-07-06 DIAGNOSIS — N2581 Secondary hyperparathyroidism of renal origin: Secondary | ICD-10-CM | POA: Diagnosis not present

## 2020-07-06 DIAGNOSIS — E8770 Fluid overload, unspecified: Secondary | ICD-10-CM | POA: Diagnosis not present

## 2020-07-06 DIAGNOSIS — Z992 Dependence on renal dialysis: Secondary | ICD-10-CM | POA: Diagnosis not present

## 2020-07-06 DIAGNOSIS — N186 End stage renal disease: Secondary | ICD-10-CM | POA: Diagnosis not present

## 2020-07-07 ENCOUNTER — Other Ambulatory Visit: Payer: Self-pay | Admitting: *Deleted

## 2020-07-07 DIAGNOSIS — Z992 Dependence on renal dialysis: Secondary | ICD-10-CM

## 2020-07-08 DIAGNOSIS — N2581 Secondary hyperparathyroidism of renal origin: Secondary | ICD-10-CM | POA: Diagnosis not present

## 2020-07-08 DIAGNOSIS — Z992 Dependence on renal dialysis: Secondary | ICD-10-CM | POA: Diagnosis not present

## 2020-07-08 DIAGNOSIS — E8770 Fluid overload, unspecified: Secondary | ICD-10-CM | POA: Diagnosis not present

## 2020-07-08 DIAGNOSIS — N186 End stage renal disease: Secondary | ICD-10-CM | POA: Diagnosis not present

## 2020-07-10 DIAGNOSIS — E8770 Fluid overload, unspecified: Secondary | ICD-10-CM | POA: Diagnosis not present

## 2020-07-10 DIAGNOSIS — N2581 Secondary hyperparathyroidism of renal origin: Secondary | ICD-10-CM | POA: Diagnosis not present

## 2020-07-10 DIAGNOSIS — Z992 Dependence on renal dialysis: Secondary | ICD-10-CM | POA: Diagnosis not present

## 2020-07-10 DIAGNOSIS — N186 End stage renal disease: Secondary | ICD-10-CM | POA: Diagnosis not present

## 2020-07-12 ENCOUNTER — Other Ambulatory Visit: Payer: Self-pay

## 2020-07-12 ENCOUNTER — Encounter: Payer: Self-pay | Admitting: Infectious Disease

## 2020-07-12 ENCOUNTER — Ambulatory Visit (INDEPENDENT_AMBULATORY_CARE_PROVIDER_SITE_OTHER): Payer: Medicare HMO | Admitting: Infectious Disease

## 2020-07-12 VITALS — BP 111/67 | HR 86 | Temp 97.6°F | Wt 190.0 lb

## 2020-07-12 DIAGNOSIS — I447 Left bundle-branch block, unspecified: Secondary | ICD-10-CM | POA: Diagnosis not present

## 2020-07-12 DIAGNOSIS — N2581 Secondary hyperparathyroidism of renal origin: Secondary | ICD-10-CM | POA: Diagnosis not present

## 2020-07-12 DIAGNOSIS — Z992 Dependence on renal dialysis: Secondary | ICD-10-CM | POA: Diagnosis not present

## 2020-07-12 DIAGNOSIS — E8770 Fluid overload, unspecified: Secondary | ICD-10-CM | POA: Diagnosis not present

## 2020-07-12 DIAGNOSIS — B20 Human immunodeficiency virus [HIV] disease: Secondary | ICD-10-CM | POA: Diagnosis not present

## 2020-07-12 DIAGNOSIS — I482 Chronic atrial fibrillation, unspecified: Secondary | ICD-10-CM | POA: Diagnosis not present

## 2020-07-12 DIAGNOSIS — I953 Hypotension of hemodialysis: Secondary | ICD-10-CM | POA: Diagnosis not present

## 2020-07-12 DIAGNOSIS — Z21 Asymptomatic human immunodeficiency virus [HIV] infection status: Secondary | ICD-10-CM | POA: Diagnosis not present

## 2020-07-12 DIAGNOSIS — I420 Dilated cardiomyopathy: Secondary | ICD-10-CM | POA: Diagnosis not present

## 2020-07-12 DIAGNOSIS — N186 End stage renal disease: Secondary | ICD-10-CM | POA: Diagnosis not present

## 2020-07-12 NOTE — Progress Notes (Signed)
Chief complaint: Follow-up for HIV disease, suffering from severe weakness  Subjective:    Patient ID: David [name], male    DOB: Feb 12, 1941, 79 y.o.   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  He has had multiple hospitalizations this year and has been in rehab skilled facilities on multiple occasions.  My most recent note for some of the discussion around goals of care.  He currently is residing at home with his wife.  He has a walker that he helps to move around with.  His HIV remains perfectly controlled with a viral load is undetectable and CD4 count that is healthy.   Past Medical History:  Diagnosis Date  . 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 Acute on chronic systolic and diastolic heart failure, NYHA class 4 ([place])   . Anemia, iron deficiency 11/15/2011  . Arthritis    "hands, right knee, feet" (02/21/2018)  . Atrial fibrillation ([place])   . Cardiac arrest ([place]) 02/17/2018  . CHF (congestive heart failure) ([place])   . Chronic combined systolic and diastolic heart failure, NYHA class 3 ([place]) 06/2010   felt to be secondary to hypertensive cardiomyopathy  . Chronic lower back pain   . CKD (chronic kidney disease) stage V requiring chronic dialysis ([place])   . COVID-19 12/2019  . ESRD on dialysis [facility])    started 02/2018 [name]  . GERD (gastroesophageal reflux disease)   . Glaucoma    "I had surgery and dont have it any more"  . Gout    daily RX (02/21/2018)  . Heart murmur    "mild" per pt  . Hepatitis    years ago  . HIV (human immunodeficiency virus infection) ([place])   . HIV infection ([place])   .  Hyperlipidemia   . Hypertension   . Nonischemic cardiomyopathy ([place])   . Pneumonia 11/2017  . Prostate cancer ([place])    hx of prostate; s/p radioactive seed implant 10/2009 Dr Janice Norrie  . Sinus bradycardia   . Sleep apnea    does not use a cpap  . Stroke [facility])    "mini stroke" years ago  . Wears glasses     Past Surgical History:  Procedure Laterality Date  . AV FISTULA PLACEMENT Right 10/13/2016   Procedure: ARTERIOVENOUS (AV) FISTULA CREATION;  Surgeon: [name], MD;  Location: [place];  Service: Vascular;  Laterality: Right;  . AV FISTULA PLACEMENT Left 02/25/2018   Procedure: INSERTION OF ARTERIOVENOUS (AV) GORE-TEX GRAFT LEFT UPPER ARM;  Surgeon: Angelia Mould, MD;  Location: [place];  Service: Vascular;  Laterality: Left;  . AV FISTULA PLACEMENT Right 08/07/2018   Procedure: Creation of right arm brachiocephalic Fistula;  Surgeon: Waynetta Sandy, MD;  Location: [place];  Service: Vascular;  Laterality: Right;  . AV FISTULA PLACEMENT Left 11/10/2019   Procedure: INSERTION OF ARTERIOVENOUS (AV) GORE-TEX GRAFT THIGH;  Surgeon: Elam Dutch, MD;  Location: [place];  Service: Vascular;  Laterality: Left;  . AV FISTULA PLACEMENT Right 12/02/2019   Procedure: INSERTION OF ARTERIOVENOUS (AV) GORE-TEX GRAFT RIGHT  THIGH;  Surgeon: Waynetta Sandy, MD;  Location: [place];  Service: Vascular;  Laterality:  Right;  Marland Kitchen BASCILIC VEIN TRANSPOSITION Left 06/24/2015   Procedure: BASILIC VEIN TRANSPOSITION;  Surgeon: Mal Misty, MD;  Location: [place];  Service: Vascular;  Laterality: Left;  . BIOPSY  03/14/2019   Procedure: BIOPSY;  Surgeon: Irene Shipper, MD;  Location: [place];  Service: Endoscopy;;  . CATARACT EXTRACTION, BILATERAL Bilateral   . COLONOSCOPY WITH PROPOFOL N/A 03/14/2019   Procedure: COLONOSCOPY WITH PROPOFOL;  Surgeon: Irene Shipper, MD;  Location: [facility] ENDOSCOPY;  Service: Endoscopy;  Laterality: N/A;  . ESOPHAGOGASTRODUODENOSCOPY (EGD) WITH PROPOFOL N/A  05/05/2014   Procedure: ESOPHAGOGASTRODUODENOSCOPY (EGD) WITH PROPOFOL;  Surgeon: Missy Sabins, MD;  Location: [facility] ENDOSCOPY;  Service: Endoscopy;  Laterality: N/A;  . EYE SURGERY Bilateral    cataract surgery   . GLAUCOMA SURGERY Bilateral   . INSERTION OF ARTERIOVENOUS (AV) ARTEGRAFT ARM  10/09/2018   Procedure: INSERTION OF ARTERIOVENOUS (AV) 24mm x 41cm ARTEGRAFT LEFT UPPER ARM;  Surgeon: Waynetta Sandy, MD;  Location: [place];  Service: Vascular;;  . INSERTION OF DIALYSIS CATHETER Right 07/14/2019   Procedure: Insertion Of Dialysis Catheter;  Surgeon: Elam Dutch, MD;  [facility] OR;  Service: Vascular;  Laterality: Right;  placed right Internal Jugular  . INSERTION PROSTATE RADIATION SEED    . IR FLUORO GUIDE CV LINE RIGHT  02/18/2018  . IR REMOVAL TUN CV CATH W/O FL  12/06/2018  . IR THROMBECTOMY AV FISTULA W/THROMBOLYSIS/PTA INC/SHUNT/IMG RIGHT Right 01/26/2020  . IR THROMBECTOMY AV FISTULA W/THROMBOLYSIS/PTA INC/SHUNT/IMG RIGHT Right 02/24/2020  . IR THROMBECTOMY AV FISTULA W/THROMBOLYSIS/PTA INC/SHUNT/IMG RIGHT Right 04/07/2020  . IR US GUIDE VASC ACCESS RIGHT  02/18/2018  . IR US GUIDE VASC ACCESS RIGHT  01/26/2020  . IR US GUIDE VASC ACCESS RIGHT  04/07/2020  . LEFT HEART CATH AND CORONARY ANGIOGRAPHY N/A 02/20/2018   Procedure: LEFT HEART CATH AND CORONARY ANGIOGRAPHY;  Surgeon: Jettie Booze, MD;  Location: [place] CV LAB;;; 50% ostial OM2, 25% mLAD.  Marland Kitchen LEFT HEART CATH AND CORONARY ANGIOGRAPHY  2004   mildly depressed LV systolic fx EF 29%,JJOACZ coronaries/abdominal aorta/renal arteries.  . LOWER EXTREMITY ANGIOGRAM Left 11/17/2019   Procedure: Lower Extremity Fistulogram;  Surgeon: Marty Heck, MD;  Location: [place];  Service: Vascular;  Laterality: Left;  . NM [place]  02/21/2010   No ischemia or infarction.  EF 27%.  Marland Kitchen RADIOACTIVE SEED IMPLANT  2010   prostate cancer  . REVISION OF ARTERIOVENOUS GORETEX GRAFT Right 07/11/2019   Procedure:  REVISION OF ARTERIOVENOUS GORETEX GRAFT RIGHT ARM WITH ARTEGRAFT;  Surgeon: Serafina Mitchell, MD;  Location: [place];  Service: Vascular;  Laterality: Right;  . SHUNTOGRAM Right 07/14/2019   Procedure: Shuntogram;  Surgeon: Elam Dutch, MD;  Location: [place];  Service: Vascular;  Laterality: Right;  . THROMBECTOMY AND REVISION OF ARTERIOVENTOUS (AV) GORETEX  GRAFT Right 07/14/2019   Procedure: THROMBECTOMY AND REVISION OF ARTERIOVENTOUS (AV) GORETEX  GRAFT;  Surgeon: Elam Dutch, MD;  Location: [place];  Service: Vascular;  Laterality: Right;  . THROMBECTOMY W/ EMBOLECTOMY Left 02/26/2018   Procedure: THROMBECTOMY ARTERIOVENOUS GRAFT;  Surgeon: Angelia Mould, MD;  Location: [place];  Service: Vascular;  Laterality: Left;  . THROMBECTOMY W/ EMBOLECTOMY Left 11/17/2019   Procedure: THROMBECTOMY ARTERIOVENOUS GRAFT LEFT THIGH;  Surgeon: Marty Heck, MD;  Location: [place];  Service: Vascular;  Laterality: Left;  . TRANSTHORACIC ECHOCARDIOGRAM  02/2012   [facility]) EF 45-50% with mild global HK.  Mild LVH,LA  mod. dilated,mild-mod. 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 AI.  Marland Kitchen TRANSTHORACIC ECHOCARDIOGRAM  9/'18; 3/'19   a) Severely reduced EF.  25 from 30%.  GR 1 DD with diffuse ecchymosis. Mild AS & AI.  Mild LA/RA dilation; b) (post PEA arest):   Moderately dilated LV with moderate concentric virtually.  EF 15 to 20%.  GR 1 DD.  Paradoxical septal motion. Mild AI.  Mild LA dilation.  Poorly visualized RV.  Marland Kitchen TRANSTHORACIC ECHOCARDIOGRAM  02/2019   EF 20-25%.  Unable to assess diastolic function (A. Fib).  No LV apical thrombus.  Mild AI.  Mildly reduced RV function, however poorly visualized.  Marland Kitchen ULTRASOUND GUIDANCE FOR VASCULAR ACCESS  02/20/2018   Procedure: Ultrasound Guidance For Vascular Access;  Surgeon: Jettie Booze, MD;  Location: [place] CV LAB;  Service: Cardiovascular;;  . UPPER EXTREMITY VENOGRAPHY N/A 07/08/2018   Procedure: UPPER  EXTREMITY VENOGRAPHY;  Surgeon: Waynetta Sandy, MD;  Location: [place] CV LAB;  Service: Cardiovascular;  Laterality: N/A;  Bilateral    Family History  Problem Relation Age of Onset  . Hypertension Mother   . Thyroid disease Mother   . Cholelithiasis Daughter   . Cholelithiasis Son   . Hypertension Maternal Grandmother   . Diabetes Maternal Grandmother   . Heart attack Neg Hx   . Hyperlipidemia Neg Hx       Social History   Socioeconomic History  . Marital status: Married    Spouse name: Not on file  . Number of children: 2  . Years of education: 43  . Highest education level: Not on file  Occupational History  . 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 Smoking status: Former Smoker    Packs/day: 1.00    Years: 30.00    Pack years: 30.00    Types: Cigarettes    Quit date: 2006    Years since quitting: 15.6  . Smokeless tobacco: Never Used  Vaping Use  . Vaping Use: Never used  Substance and Sexual Activity  . Alcohol use: Yes    Alcohol/week: 1.0 standard drink    Types: 1 Shots of liquor per week    Comment: occasional  . Drug use: Never  . Sexual activity: Not Currently    Partners: Female    Comment: declined condoms 09/2019  Other Topics Concern  . 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   Social Determinants of Health   Financial Resource Strain: Low Risk   . Difficulty of Paying Living Expenses: Not hard at all  Food Insecurity: No Food Insecurity  . Worried About Charity fundraiser in the Last Year: Never true  . Ran Out of Food in the Last Year: Never true  Transportation Needs: No Transportation Needs  . Lack of Transportation (Medical): No  . Lack of Transportation (Non-Medical): No   Physical Activity:   . Days of Exercise per Week:   . Minutes of Exercise per Session:   Stress:   . Feeling of Stress :   Social Connections:   . Frequency of Communication with Friends and Family:   .  Frequency of Social Gatherings with Friends and Family:   . Attends Religious Services:   . Active Member of Clubs or Organizations:   . Attends Archivist Meetings:   Marland Kitchen Marital Status:     Allergies  Allergen Reactions  . Dextromethorphan-Guaifenesin Other (See Comments)    Unknown reaction  . Tocotrienols Other (See Comments)    Unknown reaction  . Losartan Potassium Other (See Comments)    Causes constipation     Current Outpatient Medications:  .  acetaminophen (TYLENOL) 500 MG tablet, Take 1,000 mg by mouth every 6 (six) hours as needed for moderate pain., Disp: , Rfl:  .  allopurinol (ZYLOPRIM) 100 MG tablet, TAKE 1 TABLET BY MOUTH 4 TIMES WEEKLY( MONDAY, TUESDAY,THURDSAY AND SATURDAY) AFTER DIALYSIS, Disp: 45 tablet, Rfl: 1 .  amiodarone (PACERONE) 200 MG tablet, TAKE 1 TABLET(200 MG) BY MOUTH DAILY, Disp: 90 tablet, Rfl: 3 .  apixaban (ELIQUIS) 5 MG TABS tablet, Take 1 tablet (5 mg total) by mouth 2 (two) times daily., Disp: 60 tablet, Rfl: 5 .  [name] 50-200-25 MG TABS tablet, TAKE 1 TABLET BY MOUTH DAILY, Disp: 30 tablet, Rfl: 0 .  brimonidine (ALPHAGAN) 0.15 % ophthalmic solution, Place 1 drop into the right eye 2 (two) times daily. , Disp: , Rfl: 3 .  latanoprost (XALATAN) 0.005 % ophthalmic solution, Place 1 drop into the right eye at bedtime. , Disp: , Rfl:  .  loratadine (CLARITIN) 10 MG tablet, Take 10 mg by mouth daily at 12 noon. , Disp: , Rfl:  .  midodrine (PROAMATINE) 10 MG tablet, Take 1 tablet (10 mg total) by mouth as directed. Twice a day on the day of HD Tuesday, Thursday, Saturday (1 in AM and 1 at [place]). Take 1/2 tablet (5mg ) a day on non HD days (Patient taking differently: Take 10 mg by mouth See admin instructions. Take 20 mg in the  morning of dialysis days and 10 mg half way through treatment on dialysis days (Mon, Tues, Thurs, and Sat)), Disp: 90 tablet, Rfl: 5 .  montelukast (SINGULAIR) 10 MG tablet, Take 1 tablet (10 mg total) by mouth at bedtime. NEEDS OV/FOLLOW UP, Disp: 90 tablet, Rfl: 0 .  Multiple Vitamin (MULTIVITAMIN WITH MINERALS) TABS tablet, Take 1 tablet by mouth every evening. , Disp: , Rfl:  .  polyethylene glycol (MIRALAX / GLYCOLAX) 17 g packet, Take 17 g by mouth every evening., Disp: , Rfl:  .  sucroferric oxyhydroxide (VELPHORO) 500 MG chewable tablet, Chew 500 mg by mouth 3 (three) times daily with meals., Disp: , Rfl:  .  traMADol (ULTRAM) 50 MG tablet, Take 1 tablet (50 mg total) by mouth every 6 (six) hours as needed. (Patient taking differently: Take 50 mg by mouth every 6 (six) hours as needed for moderate pain. ), Disp: 5 tablet, Rfl: 0   Review of Systems  Constitutional: Positive for fatigue. Negative for activity change, appetite change, chills, fever and unexpected weight change.  HENT: Negative for congestion, rhinorrhea, sinus pressure, sneezing, sore throat and trouble swallowing.   Respiratory: Negative for cough.   Cardiovascular: Negative for chest pain, palpitations and leg swelling.  Gastrointestinal: Negative for abdominal distention, abdominal pain, constipation, nausea and vomiting.  Genitourinary: Negative for hematuria.  Musculoskeletal: Negative for myalgias.  Skin: Negative for color change, pallor, rash and wound.  Neurological: Positive for weakness. Negative for dizziness, tremors and light-headedness.  Hematological: Negative for adenopathy. Does not bruise/bleed easily.  Psychiatric/Behavioral: Negative for agitation, behavioral problems, confusion,  decreased concentration and sleep disturbance.       Objective:   Physical Exam Constitutional:      General: He is not in acute distress.    Appearance: He is well-developed. He is not diaphoretic.  HENT:     Head:  Normocephalic and atraumatic.     Mouth/Throat:     Dentition: Abnormal dentition. Dental caries present.     Pharynx: No oropharyngeal exudate.  Eyes:     General: No scleral icterus.    Conjunctiva/sclera: Conjunctivae normal.  Cardiovascular:     Rate and Rhythm: Normal rate and regular rhythm.     Heart sounds: Normal heart sounds. No murmur heard.  No friction rub. No gallop.   Pulmonary:     Effort: Pulmonary effort is normal. No respiratory distress.     Breath sounds: Normal breath sounds.  Abdominal:     General: Bowel sounds are normal. There is no distension.     Palpations: Abdomen is soft.     Tenderness: There is no abdominal tenderness. There is no rebound.  Musculoskeletal:        General: No tenderness.     Cervical back: Normal range of motion and neck supple.  Skin:    General: Skin is warm and dry.     Coloration: Skin is not pale.     Findings: No erythema.  Neurological:     Mental Status: He is alert and oriented to person, place, and time.     Motor: No abnormal muscle tone.     Coordination: Coordination normal.     Deep Tendon Reflexes: Reflexes are normal and symmetric.  Psychiatric:        Speech: Speech is delayed.        Behavior: Behavior normal.        Thought Content: Thought content normal.        Judgment: Judgment normal.     Was sitting on his walker chair      Assessment & Plan:    HIV Continue [name] he has renewed SPAP he can return to clinic in 6 months from an HIV standpoint.  I worry much more about his other comorbid problems though   CKD: followed by Dr Posey Pronto now on hemodialysis  HTN: Followed PCP  State cancer status post implanted radioactive beads   NICM: With atrial fibrillation cardiology following closely    Deconditioning: Hopefully can start gaining more strength and be able to move around better defer to primary care and rehab

## 2020-07-13 DIAGNOSIS — N186 End stage renal disease: Secondary | ICD-10-CM | POA: Diagnosis not present

## 2020-07-13 DIAGNOSIS — N2581 Secondary hyperparathyroidism of renal origin: Secondary | ICD-10-CM | POA: Diagnosis not present

## 2020-07-13 DIAGNOSIS — E8770 Fluid overload, unspecified: Secondary | ICD-10-CM | POA: Diagnosis not present

## 2020-07-13 DIAGNOSIS — Z992 Dependence on renal dialysis: Secondary | ICD-10-CM | POA: Diagnosis not present

## 2020-07-15 DIAGNOSIS — H4051X3 Glaucoma secondary to other eye disorders, right eye, severe stage: Secondary | ICD-10-CM | POA: Diagnosis not present

## 2020-07-15 DIAGNOSIS — N186 End stage renal disease: Secondary | ICD-10-CM | POA: Diagnosis not present

## 2020-07-15 DIAGNOSIS — Z992 Dependence on renal dialysis: Secondary | ICD-10-CM | POA: Diagnosis not present

## 2020-07-15 DIAGNOSIS — E8770 Fluid overload, unspecified: Secondary | ICD-10-CM | POA: Diagnosis not present

## 2020-07-15 DIAGNOSIS — N2581 Secondary hyperparathyroidism of renal origin: Secondary | ICD-10-CM | POA: Diagnosis not present

## 2020-07-17 DIAGNOSIS — N2581 Secondary hyperparathyroidism of renal origin: Secondary | ICD-10-CM | POA: Diagnosis not present

## 2020-07-17 DIAGNOSIS — E8770 Fluid overload, unspecified: Secondary | ICD-10-CM | POA: Diagnosis not present

## 2020-07-17 DIAGNOSIS — N186 End stage renal disease: Secondary | ICD-10-CM | POA: Diagnosis not present

## 2020-07-17 DIAGNOSIS — Z992 Dependence on renal dialysis: Secondary | ICD-10-CM | POA: Diagnosis not present

## 2020-07-19 DIAGNOSIS — Z992 Dependence on renal dialysis: Secondary | ICD-10-CM | POA: Diagnosis not present

## 2020-07-19 DIAGNOSIS — E8770 Fluid overload, unspecified: Secondary | ICD-10-CM | POA: Diagnosis not present

## 2020-07-19 DIAGNOSIS — N2581 Secondary hyperparathyroidism of renal origin: Secondary | ICD-10-CM | POA: Diagnosis not present

## 2020-07-19 DIAGNOSIS — N186 End stage renal disease: Secondary | ICD-10-CM | POA: Diagnosis not present

## 2020-07-20 DIAGNOSIS — E8770 Fluid overload, unspecified: Secondary | ICD-10-CM | POA: Diagnosis not present

## 2020-07-20 DIAGNOSIS — N2581 Secondary hyperparathyroidism of renal origin: Secondary | ICD-10-CM | POA: Diagnosis not present

## 2020-07-20 DIAGNOSIS — N186 End stage renal disease: Secondary | ICD-10-CM | POA: Diagnosis not present

## 2020-07-20 DIAGNOSIS — Z992 Dependence on renal dialysis: Secondary | ICD-10-CM | POA: Diagnosis not present

## 2020-07-21 ENCOUNTER — Ambulatory Visit (INDEPENDENT_AMBULATORY_CARE_PROVIDER_SITE_OTHER)
Admission: RE | Admit: 2020-07-21 | Discharge: 2020-07-21 | Disposition: A | Discharge status: Home / Self Care | Payer: Medicare HMO | Source: Ambulatory Visit | Attending: Vascular Surgery | Admitting: Vascular Surgery

## 2020-07-21 ENCOUNTER — Other Ambulatory Visit: Payer: Self-pay

## 2020-07-21 ENCOUNTER — Ambulatory Visit (HOSPITAL_COMMUNITY)
Admission: RE | Admit: 2020-07-21 | Discharge: 2020-07-21 | Disposition: A | Discharge status: Home / Self Care | Payer: Medicare HMO | Source: Ambulatory Visit | Attending: Vascular Surgery | Admitting: Vascular Surgery

## 2020-07-21 ENCOUNTER — Encounter: Payer: Self-pay | Admitting: Vascular Surgery

## 2020-07-21 ENCOUNTER — Ambulatory Visit (INDEPENDENT_AMBULATORY_CARE_PROVIDER_SITE_OTHER): Payer: Medicare HMO | Admitting: Vascular Surgery

## 2020-07-21 VITALS — BP 153/84 | HR 73 | Temp 97.8°F | Resp 20 | Ht 74.0 in | Wt 190.0 lb

## 2020-07-21 DIAGNOSIS — N186 End stage renal disease: Secondary | ICD-10-CM

## 2020-07-21 DIAGNOSIS — Z992 Dependence on renal dialysis: Secondary | ICD-10-CM | POA: Diagnosis not present

## 2020-07-21 MED ORDER — SODIUM CHLORIDE 0.9 % IV SOLN: 250.0000 mL | INTRAVENOUS | Status: DC | PRN

## 2020-07-21 NOTE — Progress Notes (Signed)
REASON FOR CONSULT:    To evaluate for hemodialysis access.  The consult is requested by Dr. Edrick Oh.  ASSESSMENT & PLAN:   END-STAGE RENAL DISEASE: This is a difficult access issue in that he has had bilateral upper extremity access and bilateral thigh grafts.  It has been very difficult to maintain access in this patient.  Certainly he could have a hypercoagulable condition but he is on Xarelto for atrial fibrillation.  The other potential issue is he tells me that his blood pressure has been low at times and certainly this could result in graft thrombosis.  His upper extremity arterial duplex scan today does not show any significant upper extremity arterial occlusive disease.  Based on my exam and review of the previous records he might be a candidate for a right upper arm AV graft.  I have recommended a bilateral central venogram to be sure that there is no central venous stenosis on either side.  Certainly this would be the next logical option as a redo thigh graft would be associated with significantly increased risk.  If he were to require a redo thigh graft he would need preoperative ABIs and also a duplex of his iliac veins to be sure that these are patent.  We will schedule his central venogram for a nondialysis day and make further recommendations pending these results.  [name], MD Office: [phone]   HPI:   David [name] is a pleasant 79 y.o. male, who presents for evaluation for new hemodialysis access.  He tells me he has been on dialysis for about 3 years now.  He is currently dialyzing with a right IJ tunneled dialysis catheter that was placed in November 2020.  He dialyzes on Mondays, Tuesdays, Thursdays, and Saturdays.  The patient does not remember the exact details of his access to this point.  However, he has had access in both arms in both eyes.  His most recent work was a thrombectomy of his left AV graft on 11/17/2019.  Ultimately this graft could  not be salvaged and on 12/02/2019 a right superficial femoral artery to common femoral vein looped thigh AV graft was placed.  Apparently this has been worked on several times and this too has been abandoned.  He does state that his blood pressure gets low when he is on dialysis.  He dialyzes on Mondays, Tuesdays, Thursdays, and Saturdays.  He denies any recent uremic symptoms.  He did have a central venogram done in 2019.  At that time the superior vena cava and bilateral subclavian veins were patent.  It appeared at that time that he might be a candidate for a graft in the right arm.  On the left side there were reportedly "collaterals filling the subclavian vein."   Past Medical History:  Diagnosis Date  . 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 Acute on chronic systolic and diastolic heart failure, NYHA class 4 ([place])   . Anemia, iron deficiency 11/15/2011  . Arthritis    "hands, right knee, feet" (02/21/2018)  . Atrial fibrillation ([place])   . Cardiac arrest ([place]) 02/17/2018  . CHF (congestive heart failure) ([place])   . 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 Chronic lower back pain   . CKD (chronic kidney disease) stage V requiring chronic dialysis ([place])   . COVID-19 12/2019  . ESRD on dialysis [facility])    started 02/2018 [facility]  . GERD (gastroesophageal reflux disease)   . Glaucoma    "I had surgery and dont have it any more"  . Gout    daily RX (02/21/2018)  . Heart murmur    "mild" per pt  . Hepatitis    years ago  . HIV (human immunodeficiency virus infection) ([place])   . HIV infection ([place])   . Hyperlipidemia   . Hypertension   . Nonischemic cardiomyopathy ([place])   . Pneumonia 11/2017  . Prostate cancer ([place])    hx of prostate; s/p radioactive seed implant 10/2009 Dr Janice Norrie  . Sinus bradycardia   . Sleep apnea    does not use a cpap  . Stroke [facility])     "mini stroke" years ago  . Wears glasses     Family History  Problem Relation Age of Onset  . Hypertension Mother   . Thyroid disease Mother   . Cholelithiasis Daughter   . Cholelithiasis Son   . Hypertension Maternal Grandmother   . Diabetes Maternal Grandmother   . Heart attack Neg Hx   . Hyperlipidemia Neg Hx     SOCIAL HISTORY: Social History   Socioeconomic History  . Marital status: Married    Spouse name: Not on file  . Number of children: 2  . Years of education: 82  . Highest education level: Not on file  Occupational History  . 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 Smoking status: Former Smoker    Packs/day: 1.00    Years: 30.00    Pack years: 30.00    Types: Cigarettes    Quit date: 2006    Years since quitting: 15.6  . Smokeless tobacco: Never Used  Vaping Use  . Vaping Use: Never used  Substance and Sexual Activity  . Alcohol use: Yes    Alcohol/week: 1.0 standard drink    Types: 1 Shots of liquor per week    Comment: occasional  . Drug use: Never  . Sexual activity: Not Currently    Partners: Female    Comment: declined condoms 09/2019  Other Topics Concern  . 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   Social Determinants of Health   Financial Resource Strain: Low Risk   . Difficulty of Paying Living Expenses: Not hard at all  Food Insecurity: No Food Insecurity  . Worried About Charity fundraiser in the Last Year: Never true  . Ran Out of Food in the Last Year: Never true  Transportation Needs: No Transportation Needs  . Lack of Transportation (Medical): No  . Lack of Transportation (Non-Medical): No  Physical Activity:   . Days of Exercise per Week:   . Minutes of Exercise per Session:     Stress:   . Feeling of Stress :   Social Connections:   . Frequency of Communication with Friends and Family:   . Frequency of Social Gatherings with Friends and Family:   . Attends Religious Services:   . Active Member of Clubs or Organizations:   . Attends Archivist Meetings:   Marland Kitchen Marital Status:   Intimate Partner Violence:   .  Fear of Current or Ex-Partner:   . Emotionally Abused:   Marland Kitchen Physically Abused:   . Sexually Abused:     Allergies  Allergen Reactions  . Dextromethorphan-Guaifenesin Other (See Comments)    Unknown reaction  . Tocotrienols Other (See Comments)    Unknown reaction  . Losartan Potassium Other (See Comments)    Causes constipation    Current Outpatient Medications  Medication Sig Dispense Refill  . acetaminophen (TYLENOL) 500 MG tablet Take 1,000 mg by mouth every 6 (six) hours as needed for moderate pain.    Marland Kitchen allopurinol (ZYLOPRIM) 100 MG tablet TAKE 1 TABLET BY MOUTH 4 TIMES WEEKLY( MONDAY, TUESDAY,THURDSAY AND SATURDAY) AFTER DIALYSIS 45 tablet 1  . amiodarone (PACERONE) 200 MG tablet TAKE 1 TABLET(200 MG) BY MOUTH DAILY 90 tablet 3  . apixaban (ELIQUIS) 5 MG TABS tablet Take 1 tablet (5 mg total) by mouth 2 (two) times daily. 60 tablet 5  . [name] 50-200-25 MG TABS tablet TAKE 1 TABLET BY MOUTH DAILY 30 tablet 0  . brimonidine (ALPHAGAN) 0.15 % ophthalmic solution Place 1 drop into the right eye 2 (two) times daily.   3  . latanoprost (XALATAN) 0.005 % ophthalmic solution Place 1 drop into the right eye at bedtime.     Marland Kitchen loratadine (CLARITIN) 10 MG tablet Take 10 mg by mouth daily at 12 noon.     . midodrine (PROAMATINE) 10 MG tablet Take 1 tablet (10 mg total) by mouth as directed. Twice a day on the day of HD Tuesday, Thursday, Saturday (1 in AM and 1 at Center). Take 1/2 tablet (5mg ) a day on non HD days (Patient taking differently: Take 10 mg by mouth See admin instructions. Take 20 mg in the morning of dialysis days and 10 mg half way  through treatment on dialysis days (Mon, Tues, Thurs, and Sat)) 90 tablet 5  . montelukast (SINGULAIR) 10 MG tablet Take 1 tablet (10 mg total) by mouth at bedtime. NEEDS OV/FOLLOW UP 90 tablet 0  . Multiple Vitamin (MULTIVITAMIN WITH MINERALS) TABS tablet Take 1 tablet by mouth every evening.     . polyethylene glycol (MIRALAX / GLYCOLAX) 17 g packet Take 17 g by mouth every evening.    . sucroferric oxyhydroxide (VELPHORO) 500 MG chewable tablet Chew 500 mg by mouth 3 (three) times daily with meals.    . traMADol (ULTRAM) 50 MG tablet Take 1 tablet (50 mg total) by mouth every 6 (six) hours as needed. (Patient taking differently: Take 50 mg by mouth every 6 (six) hours as needed for moderate pain. ) 5 tablet 0   No current facility-administered medications for this visit.    REVIEW OF SYSTEMS:  [X]  denotes positive finding, [ ]  denotes negative finding Cardiac  Comments:  Chest pain or chest pressure:    Shortness of breath upon exertion: x   Short of breath when lying flat:    Irregular heart rhythm:        Vascular    Pain in calf, thigh, or hip brought on by ambulation: x   Pain in feet at night that wakes you up from your sleep:     Blood clot in your veins:    Leg swelling:         Pulmonary    Oxygen at home:    Productive cough:     Wheezing:         Neurologic    Sudden weakness in arms or legs:  Sudden numbness in arms or legs:     Sudden onset of difficulty speaking or slurred speech:    Temporary loss of vision in one eye:     Problems with dizziness:         Gastrointestinal    Blood in stool:     Vomited blood:         Genitourinary    Burning when urinating:     Blood in urine:        Psychiatric    Major depression:         Hematologic    Bleeding problems:    Problems with blood clotting too easily:        Skin    Rashes or ulcers:        Constitutional    Fever or chills:     PHYSICAL EXAM:   Vitals:   07/21/20 1345  BP: (!) 153/84    Pulse: 73  Resp: 20  Temp: 97.8 F (36.6 C)  SpO2: 97%  Weight: 86.2 kg  Height: 6\' 2"  (1.88 m)    GENERAL: The patient is a well-nourished male, in no acute distress. The vital signs are documented above. CARDIAC: There is a regular rate and rhythm.  VASCULAR: I do not detect carotid bruits. On my exam it looks like he has had a right basilic vein transposition.  It looks like he has had a left upper arm AV graft. He has had bilateral thigh grafts. He has palpable radial pulses bilaterally and palpable femoral pulses bilaterally. I cannot palpate pedal pulses. On the right side he has a monophasic dorsalis pedis with a biphasic posterior tibial signal. On the left side he has a monophasic dorsalis pedis signal with a biphasic posterior tibial signal. PULMONARY: There is good air exchange bilaterally without wheezing or rales. ABDOMEN: Soft and non-tender with normal pitched bowel sounds.  MUSCULOSKELETAL: There are no major deformities or cyanosis. NEUROLOGIC: No focal weakness or paresthesias are detected. SKIN: There are no ulcers or rashes noted. PSYCHIATRIC: The patient has a normal affect.  DATA:    UPPER EXTREMITY VEIN MAP: I have independently interpreted his upper extremity vein map.  On the right side the forearm and upper arm cephalic vein are very small.  The basilic vein is small.  On the left side the forearm and upper arm cephalic vein are small.  The basilic vein is small.  Based on his vein map he does not appear to be a candidate for a fistula.  UPPER EXTREMITY ARTERIAL DUPLEX: I have independently interpreted his upper extremity arterial duplex scan.  On the right side there is a triphasic radial and ulnar waveform.  The brachial artery measures 0.49 cm in diameter.  On the left side there is a triphasic radial and ulnar waveform.  The brachial artery measures 0.5 cm in diameter.

## 2020-07-21 NOTE — H&P (View-Only) (Signed)
REASON FOR CONSULT:    To evaluate for hemodialysis access.  The consult is requested by Dr. Edrick Oh.  ASSESSMENT & PLAN:   END-STAGE RENAL DISEASE: This is a difficult access issue in that he has had bilateral upper extremity access and bilateral thigh grafts.  It has been very difficult to maintain access in this patient.  Certainly he could have a hypercoagulable condition but he is on Xarelto for atrial fibrillation.  The other potential issue is he tells me that his blood pressure has been low at times and certainly this could result in graft thrombosis.  His upper extremity arterial duplex scan today does not show any significant upper extremity arterial occlusive disease.  Based on my exam and review of the previous records he might be a candidate for a right upper arm AV graft.  I have recommended a bilateral central venogram to be sure that there is no central venous stenosis on either side.  Certainly this would be the next logical option as a redo thigh graft would be associated with significantly increased risk.  If he were to require a redo thigh graft he would need preoperative ABIs and also a duplex of his iliac veins to be sure that these are patent.  We will schedule his central venogram for a nondialysis day and make further recommendations pending these results.  [name], MD Office: [phone]   HPI:   David [name] is a pleasant 79 y.o. male, who presents for evaluation for new hemodialysis access.  He tells me he has been on dialysis for about 3 years now.  He is currently dialyzing with a right IJ tunneled dialysis catheter that was placed in November 2020.  He dialyzes on Mondays, Tuesdays, Thursdays, and Saturdays.  The patient does not remember the exact details of his access to this point.  However, he has had access in both arms in both eyes.  His most recent work was a thrombectomy of his left AV graft on 11/17/2019.  Ultimately this graft could  not be salvaged and on 12/02/2019 a right superficial femoral artery to common femoral vein looped thigh AV graft was placed.  Apparently this has been worked on several times and this too has been abandoned.  He does state that his blood pressure gets low when he is on dialysis.  He dialyzes on Mondays, Tuesdays, Thursdays, and Saturdays.  He denies any recent uremic symptoms.  He did have a central venogram done in 2019.  At that time the superior vena cava and bilateral subclavian veins were patent.  It appeared at that time that he might be a candidate for a graft in the right arm.  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   Acute on chronic systolic and diastolic heart failure, NYHA class 4 ([facility])    Anemia, iron deficiency 11/15/2011   Arthritis    "hands, right knee, feet" (02/21/2018)   Atrial fibrillation ([facility])    Cardiac arrest ([place]) 02/17/2018   CHF (congestive heart failure) ([facility])    Chronic combined systolic and diastolic heart failure, NYHA class 3 ([place]) 06/2010   felt to be secondary to hypertensive cardiomyopathy   Chronic lower back pain    CKD (chronic kidney disease) stage V requiring chronic dialysis ([place])    COVID-19 12/2019   ESRD on dialysis [facility])    started 02/2018 [facility]   GERD (gastroesophageal reflux disease)    Glaucoma    "I had surgery and dont have it any more"   Gout    daily RX (02/21/2018)   Heart murmur    "mild" per pt   Hepatitis    years ago   HIV (human immunodeficiency virus infection) ([place])    HIV infection ([place])    Hyperlipidemia    Hypertension    Nonischemic cardiomyopathy ([place])    Pneumonia 11/2017   Prostate cancer ([place])    hx of prostate; s/p radioactive seed implant 10/2009 Dr Janice Norrie   Sinus bradycardia    Sleep apnea    does not use a cpap   Stroke ([place])     "mini stroke" years ago   Wears glasses     Family History  Problem Relation Age of Onset   Hypertension Mother    Thyroid disease Mother    Cholelithiasis Daughter    Cholelithiasis Son    Hypertension Maternal Grandmother    Diabetes Maternal Grandmother    Heart attack Neg Hx    Hyperlipidemia Neg Hx     SOCIAL HISTORY: Social History   Socioeconomic History   Marital status: Married    Spouse name: Not on file   Number of children: 2   Years of education: 12   Highest education level: Not on file  Occupational History   Occupation: retired, picks up Aeronautical engineer: RETIRED  Tobacco Use   Smoking status: Former Smoker    Packs/day: 1.00    Years: 30.00    Pack years: 30.00    Types: Cigarettes    Quit date: 2006    Years since quitting: 15.6   Smokeless tobacco: Never Used  Scientific laboratory technician Use: Never used  Substance and Sexual Activity   Alcohol use: Yes    Alcohol/week: 1.0 standard drink    Types: 1 Shots of liquor per week    Comment: occasional   Drug use: Never   Sexual activity: Not Currently    Partners: Female    Comment: declined condoms 09/2019  Other Topics Concern   Not on file  Social History Narrative   Retired, 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   Social Determinants of Health   Financial Resource Strain: Low Risk    Difficulty of Paying Living Expenses: Not hard at all  Food Insecurity: No Food Insecurity   Worried About Charity fundraiser in the Last Year: Never true   [place] in the Last Year: Never true  Transportation Needs: No Transportation Needs   Lack of Transportation (Medical): No   Lack of Transportation (Non-Medical): No  Physical Activity:    Days of Exercise per Week:    Minutes of Exercise per Session:     Stress:    Feeling of Stress :   Social Connections:    Frequency of Communication with Friends and Family:    Frequency of Social Gatherings with Friends and Family:    Attends Religious Services:    Active Member of Clubs or Organizations:    Attends Archivist Meetings:    Marital Status:   Intimate Partner Violence:  Fear of Current or Ex-Partner:    Emotionally Abused:    Physically Abused:    Sexually Abused:     Allergies  Allergen Reactions   Dextromethorphan-Guaifenesin Other (See Comments)    Unknown reaction   Tocotrienols Other (See Comments)    Unknown reaction   Losartan Potassium Other (See Comments)    Causes constipation    Current Outpatient Medications  Medication Sig Dispense Refill   acetaminophen (TYLENOL) 500 MG tablet Take 1,000 mg by mouth every 6 (six) hours as needed for moderate pain.     allopurinol (ZYLOPRIM) 100 MG tablet TAKE 1 TABLET BY MOUTH 4 TIMES WEEKLY( MONDAY, TUESDAY,THURDSAY AND SATURDAY) AFTER DIALYSIS 45 tablet 1   amiodarone (PACERONE) 200 MG tablet TAKE 1 TABLET(200 MG) BY MOUTH DAILY 90 tablet 3   apixaban (ELIQUIS) 5 MG TABS tablet Take 1 tablet (5 mg total) by mouth 2 (two) times daily. 60 tablet 5   [name] 50-200-25 MG TABS tablet TAKE 1 TABLET BY MOUTH DAILY 30 tablet 0   brimonidine (ALPHAGAN) 0.15 % ophthalmic solution Place 1 drop into the right eye 2 (two) times daily.   3   latanoprost (XALATAN) 0.005 % ophthalmic solution Place 1 drop into the right eye at bedtime.      loratadine (CLARITIN) 10 MG tablet Take 10 mg by mouth daily at 12 noon.      midodrine (PROAMATINE) 10 MG tablet Take 1 tablet (10 mg total) by mouth as directed. Twice a day on the day of HD Tuesday, Thursday, Saturday (1 in AM and 1 at [place]). Take 1/2 tablet (5mg ) a day on non HD days (Patient taking differently: Take 10 mg by mouth See admin instructions. Take 20 mg in the morning of dialysis days and 10 mg half way  through treatment on dialysis days (Mon, Tues, Thurs, and Sat)) 90 tablet 5   montelukast (SINGULAIR) 10 MG tablet Take 1 tablet (10 mg total) by mouth at bedtime. NEEDS OV/FOLLOW UP 90 tablet 0   Multiple Vitamin (MULTIVITAMIN WITH MINERALS) TABS tablet Take 1 tablet by mouth every evening.      polyethylene glycol (MIRALAX / GLYCOLAX) 17 g packet Take 17 g by mouth every evening.     sucroferric oxyhydroxide (VELPHORO) 500 MG chewable tablet Chew 500 mg by mouth 3 (three) times daily with meals.     traMADol (ULTRAM) 50 MG tablet Take 1 tablet (50 mg total) by mouth every 6 (six) hours as needed. (Patient taking differently: Take 50 mg by mouth every 6 (six) hours as needed for moderate pain. ) 5 tablet 0   No current facility-administered medications for this visit.    REVIEW OF SYSTEMS:  [X]  denotes positive finding, [ ]  denotes negative finding Cardiac  Comments:  Chest pain or chest pressure:    Shortness of breath upon exertion: x   Short of breath when lying flat:    Irregular heart rhythm:        Vascular    Pain in calf, thigh, or hip brought on by ambulation: x   Pain in feet at night that wakes you up from your sleep:     Blood clot in your veins:    Leg swelling:         Pulmonary    Oxygen at home:    Productive cough:     Wheezing:         Neurologic    Sudden weakness in arms or legs:  Sudden numbness in arms or legs:     Sudden onset of difficulty speaking or slurred speech:    Temporary loss of vision in one eye:     Problems with dizziness:         Gastrointestinal    Blood in stool:     Vomited blood:         Genitourinary    Burning when urinating:     Blood in urine:        Psychiatric    Major depression:         Hematologic    Bleeding problems:    Problems with blood clotting too easily:        Skin    Rashes or ulcers:        Constitutional    Fever or chills:     PHYSICAL EXAM:   Vitals:   07/21/20 1345  BP: (!) 153/84    Pulse: 73  Resp: 20  Temp: 97.8 F (36.6 C)  SpO2: 97%  Weight: 86.2 kg  Height: 6\' 2"  (1.88 m)    GENERAL: The patient is a well-nourished male, in no acute distress. The vital signs are documented above. CARDIAC: There is a regular rate and rhythm.  VASCULAR: I do not detect carotid bruits. On my exam it looks like he has had a right basilic vein transposition.  It looks like he has had a left upper arm AV graft. He has had bilateral thigh grafts. He has palpable radial pulses bilaterally and palpable femoral pulses bilaterally. I cannot palpate pedal pulses. On the right side he has a monophasic dorsalis pedis with a biphasic posterior tibial signal. On the left side he has a monophasic dorsalis pedis signal with a biphasic posterior tibial signal. PULMONARY: There is good air exchange bilaterally without wheezing or rales. ABDOMEN: Soft and non-tender with normal pitched bowel sounds.  MUSCULOSKELETAL: There are no major deformities or cyanosis. NEUROLOGIC: No focal weakness or paresthesias are detected. SKIN: There are no ulcers or rashes noted. PSYCHIATRIC: The patient has a normal affect.  DATA:    UPPER EXTREMITY VEIN MAP: I have independently interpreted his upper extremity vein map.  On the right side the forearm and upper arm cephalic vein are very small.  The basilic vein is small.  On the left side the forearm and upper arm cephalic vein are small.  The basilic vein is small.  Based on his vein map he does not appear to be a candidate for a fistula.  UPPER EXTREMITY ARTERIAL DUPLEX: I have independently interpreted his upper extremity arterial duplex scan.  On the right side there is a triphasic radial and ulnar waveform.  The brachial artery measures 0.49 cm in diameter.  On the left side there is a triphasic radial and ulnar waveform.  The brachial artery measures 0.5 cm in diameter.

## 2020-07-21 NOTE — H&P (View-Only) (Signed)
REASON FOR CONSULT:    To evaluate for hemodialysis access.  The consult is requested by Dr. Edrick Oh.  ASSESSMENT & PLAN:   END-STAGE RENAL DISEASE: This is a difficult access issue in that he has had bilateral upper extremity access and bilateral thigh grafts.  It has been very difficult to maintain access in this patient.  Certainly he could have a hypercoagulable condition but he is on Xarelto for atrial fibrillation.  The other potential issue is he tells me that his blood pressure has been low at times and certainly this could result in graft thrombosis.  His upper extremity arterial duplex scan today does not show any significant upper extremity arterial occlusive disease.  Based on my exam and review of the previous records he might be a candidate for a right upper arm AV graft.  I have recommended a bilateral central venogram to be sure that there is no central venous stenosis on either side.  Certainly this would be the next logical option as a redo thigh graft would be associated with significantly increased risk.  If he were to require a redo thigh graft he would need preoperative ABIs and also a duplex of his iliac veins to be sure that these are patent.  We will schedule his central venogram for a nondialysis day and make further recommendations pending these results.  [name], MD Office: [phone]   HPI:   David [name] is a pleasant 79 y.o. male, who presents for evaluation for new hemodialysis access.  He tells me he has been on dialysis for about 3 years now.  He is currently dialyzing with a right IJ tunneled dialysis catheter that was placed in November 2020.  He dialyzes on Mondays, Tuesdays, Thursdays, and Saturdays.  The patient does not remember the exact details of his access to this point.  However, he has had access in both arms in both eyes.  His most recent work was a thrombectomy of his left AV graft on 11/17/2019.  Ultimately this graft could  not be salvaged and on 12/02/2019 a right superficial femoral artery to common femoral vein looped thigh AV graft was placed.  Apparently this has been worked on several times and this too has been abandoned.  He does state that his blood pressure gets low when he is on dialysis.  He dialyzes on Mondays, Tuesdays, Thursdays, and Saturdays.  He denies any recent uremic symptoms.  He did have a central venogram done in 2019.  At that time the superior vena cava and bilateral subclavian veins were patent.  It appeared at that time that he might be a candidate for a graft in the right arm.  On the left side there were reportedly "collaterals filling the subclavian vein."   Past Medical History:  Diagnosis Date  . 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 Acute on chronic systolic and diastolic heart failure, NYHA class 4 ([place])   . Anemia, iron deficiency 11/15/2011  . Arthritis    "hands, right knee, feet" (02/21/2018)  . Atrial fibrillation ([place])   . Cardiac arrest ([place]) 02/17/2018  . CHF (congestive heart failure) ([place])   . 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 Chronic lower back pain   . CKD (chronic kidney disease) stage V requiring chronic dialysis ([place])   . COVID-19 12/2019  . ESRD on dialysis [facility])    started 02/2018 [facility]  . GERD (gastroesophageal reflux disease)   . Glaucoma    "I had surgery and dont have it any more"  . Gout    daily RX (02/21/2018)  . Heart murmur    "mild" per pt  . Hepatitis    years ago  . HIV (human immunodeficiency virus infection) ([place])   . HIV infection ([place])   . Hyperlipidemia   . Hypertension   . Nonischemic cardiomyopathy ([place])   . Pneumonia 11/2017  . Prostate cancer ([place])    hx of prostate; s/p radioactive seed implant 10/2009 Dr Janice Norrie  . Sinus bradycardia   . Sleep apnea    does not use a cpap  . Stroke [facility])     "mini stroke" years ago  . Wears glasses     Family History  Problem Relation Age of Onset  . Hypertension Mother   . Thyroid disease Mother   . Cholelithiasis Daughter   . Cholelithiasis Son   . Hypertension Maternal Grandmother   . Diabetes Maternal Grandmother   . Heart attack Neg Hx   . Hyperlipidemia Neg Hx     SOCIAL HISTORY: Social History   Socioeconomic History  . Marital status: Married    Spouse name: Not on file  . Number of children: 2  . Years of education: 61  . Highest education level: Not on file  Occupational History  . 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 Smoking status: Former Smoker    Packs/day: 1.00    Years: 30.00    Pack years: 30.00    Types: Cigarettes    Quit date: 2006    Years since quitting: 15.6  . Smokeless tobacco: Never Used  Vaping Use  . Vaping Use: Never used  Substance and Sexual Activity  . Alcohol use: Yes    Alcohol/week: 1.0 standard drink    Types: 1 Shots of liquor per week    Comment: occasional  . Drug use: Never  . Sexual activity: Not Currently    Partners: Female    Comment: declined condoms 09/2019  Other Topics Concern  . 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   Social Determinants of Health   Financial Resource Strain: Low Risk   . Difficulty of Paying Living Expenses: Not hard at all  Food Insecurity: No Food Insecurity  . Worried About Charity fundraiser in the Last Year: Never true  . Ran Out of Food in the Last Year: Never true  Transportation Needs: No Transportation Needs  . Lack of Transportation (Medical): No  . Lack of Transportation (Non-Medical): No  Physical Activity:   . Days of Exercise per Week:   . Minutes of Exercise per Session:     Stress:   . Feeling of Stress :   Social Connections:   . Frequency of Communication with Friends and Family:   . Frequency of Social Gatherings with Friends and Family:   . Attends Religious Services:   . Active Member of Clubs or Organizations:   . Attends Archivist Meetings:   Marland Kitchen Marital Status:   Intimate Partner Violence:   .  Fear of Current or Ex-Partner:   . Emotionally Abused:   Marland Kitchen Physically Abused:   . Sexually Abused:     Allergies  Allergen Reactions  . Dextromethorphan-Guaifenesin Other (See Comments)    Unknown reaction  . Tocotrienols Other (See Comments)    Unknown reaction  . Losartan Potassium Other (See Comments)    Causes constipation    Current Outpatient Medications  Medication Sig Dispense Refill  . acetaminophen (TYLENOL) 500 MG tablet Take 1,000 mg by mouth every 6 (six) hours as needed for moderate pain.    Marland Kitchen allopurinol (ZYLOPRIM) 100 MG tablet TAKE 1 TABLET BY MOUTH 4 TIMES WEEKLY( MONDAY, TUESDAY,THURDSAY AND SATURDAY) AFTER DIALYSIS 45 tablet 1  . amiodarone (PACERONE) 200 MG tablet TAKE 1 TABLET(200 MG) BY MOUTH DAILY 90 tablet 3  . apixaban (ELIQUIS) 5 MG TABS tablet Take 1 tablet (5 mg total) by mouth 2 (two) times daily. 60 tablet 5  . [name] 50-200-25 MG TABS tablet TAKE 1 TABLET BY MOUTH DAILY 30 tablet 0  . brimonidine (ALPHAGAN) 0.15 % ophthalmic solution Place 1 drop into the right eye 2 (two) times daily.   3  . latanoprost (XALATAN) 0.005 % ophthalmic solution Place 1 drop into the right eye at bedtime.     Marland Kitchen loratadine (CLARITIN) 10 MG tablet Take 10 mg by mouth daily at 12 noon.     . midodrine (PROAMATINE) 10 MG tablet Take 1 tablet (10 mg total) by mouth as directed. Twice a day on the day of HD Tuesday, Thursday, Saturday (1 in AM and 1 at [place]). Take 1/2 tablet (5mg ) a day on non HD days (Patient taking differently: Take 10 mg by mouth See admin instructions. Take 20 mg in the morning of dialysis days and 10 mg half way  through treatment on dialysis days (Mon, Tues, Thurs, and Sat)) 90 tablet 5  . montelukast (SINGULAIR) 10 MG tablet Take 1 tablet (10 mg total) by mouth at bedtime. NEEDS OV/FOLLOW UP 90 tablet 0  . Multiple Vitamin (MULTIVITAMIN WITH MINERALS) TABS tablet Take 1 tablet by mouth every evening.     . polyethylene glycol (MIRALAX / GLYCOLAX) 17 g packet Take 17 g by mouth every evening.    . sucroferric oxyhydroxide (VELPHORO) 500 MG chewable tablet Chew 500 mg by mouth 3 (three) times daily with meals.    . traMADol (ULTRAM) 50 MG tablet Take 1 tablet (50 mg total) by mouth every 6 (six) hours as needed. (Patient taking differently: Take 50 mg by mouth every 6 (six) hours as needed for moderate pain. ) 5 tablet 0   No current facility-administered medications for this visit.    REVIEW OF SYSTEMS:  [X]  denotes positive finding, [ ]  denotes negative finding Cardiac  Comments:  Chest pain or chest pressure:    Shortness of breath upon exertion: x   Short of breath when lying flat:    Irregular heart rhythm:        Vascular    Pain in calf, thigh, or hip brought on by ambulation: x   Pain in feet at night that wakes you up from your sleep:     Blood clot in your veins:    Leg swelling:         Pulmonary    Oxygen at home:    Productive cough:     Wheezing:         Neurologic    Sudden weakness in arms or legs:  Sudden numbness in arms or legs:     Sudden onset of difficulty speaking or slurred speech:    Temporary loss of vision in one eye:     Problems with dizziness:         Gastrointestinal    Blood in stool:     Vomited blood:         Genitourinary    Burning when urinating:     Blood in urine:        Psychiatric    Major depression:         Hematologic    Bleeding problems:    Problems with blood clotting too easily:        Skin    Rashes or ulcers:        Constitutional    Fever or chills:     PHYSICAL EXAM:   Vitals:   07/21/20 1345  BP: (!) 153/84    Pulse: 73  Resp: 20  Temp: 97.8 F (36.6 C)  SpO2: 97%  Weight: 86.2 kg  Height: 6\' 2"  (1.88 m)    GENERAL: The patient is a well-nourished male, in no acute distress. The vital signs are documented above. CARDIAC: There is a regular rate and rhythm.  VASCULAR: I do not detect carotid bruits. On my exam it looks like he has had a right basilic vein transposition.  It looks like he has had a left upper arm AV graft. He has had bilateral thigh grafts. He has palpable radial pulses bilaterally and palpable femoral pulses bilaterally. I cannot palpate pedal pulses. On the right side he has a monophasic dorsalis pedis with a biphasic posterior tibial signal. On the left side he has a monophasic dorsalis pedis signal with a biphasic posterior tibial signal. PULMONARY: There is good air exchange bilaterally without wheezing or rales. ABDOMEN: Soft and non-tender with normal pitched bowel sounds.  MUSCULOSKELETAL: There are no major deformities or cyanosis. NEUROLOGIC: No focal weakness or paresthesias are detected. SKIN: There are no ulcers or rashes noted. PSYCHIATRIC: The patient has a normal affect.  DATA:    UPPER EXTREMITY VEIN MAP: I have independently interpreted his upper extremity vein map.  On the right side the forearm and upper arm cephalic vein are very small.  The basilic vein is small.  On the left side the forearm and upper arm cephalic vein are small.  The basilic vein is small.  Based on his vein map he does not appear to be a candidate for a fistula.  UPPER EXTREMITY ARTERIAL DUPLEX: I have independently interpreted his upper extremity arterial duplex scan.  On the right side there is a triphasic radial and ulnar waveform.  The brachial artery measures 0.49 cm in diameter.  On the left side there is a triphasic radial and ulnar waveform.  The brachial artery measures 0.5 cm in diameter.

## 2020-07-22 DIAGNOSIS — E8770 Fluid overload, unspecified: Secondary | ICD-10-CM | POA: Diagnosis not present

## 2020-07-22 DIAGNOSIS — N186 End stage renal disease: Secondary | ICD-10-CM | POA: Diagnosis not present

## 2020-07-22 DIAGNOSIS — Z992 Dependence on renal dialysis: Secondary | ICD-10-CM | POA: Diagnosis not present

## 2020-07-22 DIAGNOSIS — N2581 Secondary hyperparathyroidism of renal origin: Secondary | ICD-10-CM | POA: Diagnosis not present

## 2020-07-24 DIAGNOSIS — Z992 Dependence on renal dialysis: Secondary | ICD-10-CM | POA: Diagnosis not present

## 2020-07-24 DIAGNOSIS — E8770 Fluid overload, unspecified: Secondary | ICD-10-CM | POA: Diagnosis not present

## 2020-07-24 DIAGNOSIS — N2581 Secondary hyperparathyroidism of renal origin: Secondary | ICD-10-CM | POA: Diagnosis not present

## 2020-07-24 DIAGNOSIS — N186 End stage renal disease: Secondary | ICD-10-CM | POA: Diagnosis not present

## 2020-07-26 DIAGNOSIS — E8770 Fluid overload, unspecified: Secondary | ICD-10-CM | POA: Diagnosis not present

## 2020-07-26 DIAGNOSIS — N186 End stage renal disease: Secondary | ICD-10-CM | POA: Diagnosis not present

## 2020-07-26 DIAGNOSIS — Z992 Dependence on renal dialysis: Secondary | ICD-10-CM | POA: Diagnosis not present

## 2020-07-26 DIAGNOSIS — N2581 Secondary hyperparathyroidism of renal origin: Secondary | ICD-10-CM | POA: Diagnosis not present

## 2020-07-27 ENCOUNTER — Other Ambulatory Visit (HOSPITAL_COMMUNITY)
Admission: RE | Admit: 2020-07-27 | Discharge: 2020-07-27 | Disposition: A | Discharge status: Home / Self Care | Payer: Medicare HMO | Source: Ambulatory Visit | Attending: Vascular Surgery | Admitting: Vascular Surgery

## 2020-07-27 DIAGNOSIS — Z20822 Contact with and (suspected) exposure to covid-19: Secondary | ICD-10-CM | POA: Diagnosis not present

## 2020-07-27 DIAGNOSIS — Z01812 Encounter for preprocedural laboratory examination: Secondary | ICD-10-CM | POA: Insufficient documentation

## 2020-07-27 DIAGNOSIS — N186 End stage renal disease: Secondary | ICD-10-CM | POA: Diagnosis not present

## 2020-07-27 DIAGNOSIS — E8770 Fluid overload, unspecified: Secondary | ICD-10-CM | POA: Diagnosis not present

## 2020-07-27 DIAGNOSIS — N2581 Secondary hyperparathyroidism of renal origin: Secondary | ICD-10-CM | POA: Diagnosis not present

## 2020-07-27 DIAGNOSIS — Z992 Dependence on renal dialysis: Secondary | ICD-10-CM | POA: Diagnosis not present

## 2020-07-27 LAB — SARS CORONAVIRUS 2 (TAT 6-24 HRS): SARS Coronavirus 2: NEGATIVE

## 2020-07-29 ENCOUNTER — Telehealth: Payer: Self-pay

## 2020-07-29 DIAGNOSIS — N2581 Secondary hyperparathyroidism of renal origin: Secondary | ICD-10-CM | POA: Diagnosis not present

## 2020-07-29 DIAGNOSIS — E8770 Fluid overload, unspecified: Secondary | ICD-10-CM | POA: Diagnosis not present

## 2020-07-29 DIAGNOSIS — Z992 Dependence on renal dialysis: Secondary | ICD-10-CM | POA: Diagnosis not present

## 2020-07-29 DIAGNOSIS — N186 End stage renal disease: Secondary | ICD-10-CM | POA: Diagnosis not present

## 2020-07-29 NOTE — Telephone Encounter (Signed)
Spoke with pt regarding change in arrival time of 0700AM on tomorrow, 07/30/20 for venogram procedure with Dr. Scot Dock. Pt verbalized understanding.

## 2020-07-30 ENCOUNTER — Other Ambulatory Visit: Payer: Self-pay | Admitting: Family

## 2020-07-30 ENCOUNTER — Encounter (HOSPITAL_COMMUNITY): Payer: Self-pay | Admitting: Vascular Surgery

## 2020-07-30 ENCOUNTER — Other Ambulatory Visit: Payer: Self-pay

## 2020-07-30 ENCOUNTER — Ambulatory Visit (HOSPITAL_COMMUNITY)
Admission: RE | Admit: 2020-07-30 | Discharge: 2020-07-30 | Disposition: A | Discharge status: Home / Self Care | Payer: Medicare HMO | Attending: Vascular Surgery | Admitting: Vascular Surgery

## 2020-07-30 ENCOUNTER — Encounter (HOSPITAL_COMMUNITY)
Admission: RE | Disposition: A | Discharge status: Home / Self Care | Payer: Self-pay | Source: Home / Self Care | Attending: Vascular Surgery

## 2020-07-30 DIAGNOSIS — I4891 Unspecified atrial fibrillation: Secondary | ICD-10-CM | POA: Insufficient documentation

## 2020-07-30 DIAGNOSIS — Z8674 Personal history of sudden cardiac arrest: Secondary | ICD-10-CM | POA: Insufficient documentation

## 2020-07-30 DIAGNOSIS — Z8673 Personal history of transient ischemic attack (TIA), and cerebral infarction without residual deficits: Secondary | ICD-10-CM | POA: Diagnosis not present

## 2020-07-30 DIAGNOSIS — I132 Hypertensive heart and chronic kidney disease with heart failure and with stage 5 chronic kidney disease, or end stage renal disease: Secondary | ICD-10-CM | POA: Diagnosis not present

## 2020-07-30 DIAGNOSIS — K219 Gastro-esophageal reflux disease without esophagitis: Secondary | ICD-10-CM | POA: Diagnosis not present

## 2020-07-30 DIAGNOSIS — Z833 Family history of diabetes mellitus: Secondary | ICD-10-CM | POA: Diagnosis not present

## 2020-07-30 DIAGNOSIS — Z21 Asymptomatic human immunodeficiency virus [HIV] infection status: Secondary | ICD-10-CM | POA: Insufficient documentation

## 2020-07-30 DIAGNOSIS — N185 Chronic kidney disease, stage 5: Secondary | ICD-10-CM | POA: Diagnosis not present

## 2020-07-30 DIAGNOSIS — G4730 Sleep apnea, unspecified: Secondary | ICD-10-CM | POA: Diagnosis not present

## 2020-07-30 DIAGNOSIS — N186 End stage renal disease: Secondary | ICD-10-CM | POA: Insufficient documentation

## 2020-07-30 DIAGNOSIS — Z992 Dependence on renal dialysis: Secondary | ICD-10-CM

## 2020-07-30 DIAGNOSIS — Z8249 Family history of ischemic heart disease and other diseases of the circulatory system: Secondary | ICD-10-CM | POA: Diagnosis not present

## 2020-07-30 DIAGNOSIS — Z8616 Personal history of COVID-19: Secondary | ICD-10-CM | POA: Diagnosis not present

## 2020-07-30 DIAGNOSIS — Z888 Allergy status to other drugs, medicaments and biological substances status: Secondary | ICD-10-CM | POA: Diagnosis not present

## 2020-07-30 DIAGNOSIS — Z8546 Personal history of malignant neoplasm of prostate: Secondary | ICD-10-CM | POA: Insufficient documentation

## 2020-07-30 DIAGNOSIS — E785 Hyperlipidemia, unspecified: Secondary | ICD-10-CM | POA: Diagnosis not present

## 2020-07-30 DIAGNOSIS — M109 Gout, unspecified: Secondary | ICD-10-CM | POA: Insufficient documentation

## 2020-07-30 DIAGNOSIS — Z7901 Long term (current) use of anticoagulants: Secondary | ICD-10-CM | POA: Diagnosis not present

## 2020-07-30 DIAGNOSIS — Z8619 Personal history of other infectious and parasitic diseases: Secondary | ICD-10-CM | POA: Diagnosis not present

## 2020-07-30 DIAGNOSIS — H409 Unspecified glaucoma: Secondary | ICD-10-CM | POA: Diagnosis not present

## 2020-07-30 DIAGNOSIS — Z87891 Personal history of nicotine dependence: Secondary | ICD-10-CM | POA: Insufficient documentation

## 2020-07-30 DIAGNOSIS — I428 Other cardiomyopathies: Secondary | ICD-10-CM | POA: Insufficient documentation

## 2020-07-30 HISTORY — PX: A/V FISTULAGRAM: CATH118298

## 2020-07-30 LAB — POCT I-STAT, CHEM 8
BUN: 28 mg/dL — ABNORMAL HIGH (ref 8–23)
Calcium, Ion: 1.15 mmol/L (ref 1.15–1.40)
Chloride: 100 mmol/L (ref 98–111)
Creatinine, Ser: 6.9 mg/dL — ABNORMAL HIGH (ref 0.61–1.24)
Glucose, Bld: 95 mg/dL (ref 70–99)
HCT: 31 % — ABNORMAL LOW (ref 39.0–52.0)
Hemoglobin: 10.5 g/dL — ABNORMAL LOW (ref 13.0–17.0)
Potassium: 5.1 mmol/L (ref 3.5–5.1)
Sodium: 139 mmol/L (ref 135–145)
TCO2: 31 mmol/L (ref 22–32)

## 2020-07-30 SURGERY — A/V FISTULAGRAM
Anesthesia: LOCAL | Laterality: Bilateral

## 2020-07-30 MED ORDER — SODIUM CHLORIDE 0.9% FLUSH: 3.0000 mL | INTRAVENOUS | Status: DC | PRN

## 2020-07-30 MED ORDER — IODIXANOL 320 MG/ML IV SOLN
INTRAVENOUS | Status: DC | PRN
  Administered 2020-07-30: 70 mL

## 2020-07-30 MED ORDER — SODIUM CHLORIDE 0.9% FLUSH: 3.0000 mL | Freq: Two times a day (BID) | INTRAVENOUS | Status: DC

## 2020-07-30 SURGICAL SUPPLY — 7 items
BAG SNAP BAND KOVER 36X36 (MISCELLANEOUS) ×2 IMPLANT
COVER DOME SNAP 22 D (MISCELLANEOUS) ×2 IMPLANT
PROTECTION STATION PRESSURIZED (MISCELLANEOUS) ×2
SHEATH PROBE COVER 6X72 (BAG) ×2 IMPLANT
STATION PROTECTION PRESSURIZED (MISCELLANEOUS) ×1 IMPLANT
STOPCOCK MORSE 400PSI 3WAY (MISCELLANEOUS) ×2 IMPLANT
TUBING CIL FLEX 10 FLL-RA (TUBING) ×2 IMPLANT

## 2020-07-30 NOTE — Discharge Instructions (Signed)
Venogram A venogram, or venography, is a procedure that uses an X-ray and dye (contrast) to examine how well the veins work and how blood flows through them. Contrast helps the veins show up on X-rays. A venogram may be done:  To evaluate abnormalities in the vein.  To identify clots within veins, such as deep vein thrombosis (DVT).  To map out the veins that might be needed for another procedure. Tell a health care provider about:  Any allergies you have, especially to medicines, shellfish, iodine, and contrast.  All medicines you are taking, including vitamins, herbs, eye drops, creams, and over-the-counter medicines.  Any problems you or family members have had with anesthetic medicines.  Any blood disorders you have.  Any surgeries you have had and any complications that occurred.  Any medical conditions you have.  Whether you are pregnant, may be pregnant, or are breastfeeding.  Any history of smoking or tobacco use. What are the risks? Generally, this is a safe procedure. However, problems may occur, including:  Infection.  Bleeding.  Blood clots.  Allergic reaction to medicines or contrast.  Damage to other structures or organs.  Kidney problems.  Increased risk of cancer. Being exposed to too much radiation over a lifetime can increase the risk of cancer. The risk is small. What happens before the procedure? Medicines Ask your health care provider about:  Changing or stopping your regular medicines. This is especially important if you are taking diabetes medicines or blood thinners.  Taking medicines such as aspirin and ibuprofen. These medicines can thin your blood. Do not take these medicines unless your health care provider tells you to take them.  Taking over-the-counter medicines, vitamins, herbs, and supplements. General instructions  Follow instructions from your health care provider about eating or drinking restrictions.  You may have blood tests  to check how well your kidneys and liver are working and how well your blood can clot.  Plan to have someone take you home from the hospital or clinic. What happens during the procedure?   An IV will be inserted into one of your veins.  You may be given a medicine to help you relax (sedative).  You will lie down on an X-ray table. The table may be tilted in different directions during the procedure to help the contrast move throughout your body. Safety straps will keep you secure if the table is tilted.  If veins in your arm or leg will be examined, a band may be wrapped around that arm or leg to keep the veins full of blood. This may cause your arm or leg to feel numb.  The contrast will be injected into your IV. You may have a hot, flushed feeling as it moves throughout your body. You may also have a metallic taste in your mouth. Both of these sensations will go away after the test is complete.  You may be asked to lie in different positions or place your legs or arms in different positions.  At the end of the procedure, you may be given IV fluids to help wash or flush the contrast out of your veins.  The IV will be removed, and pressure will be applied to the IV site to prevent bleeding. A bandage (dressing) may be applied to the IV site. The exact procedure may vary among health care providers and hospitals. What can I expect after the procedure?  Your blood pressure, heart rate, breathing rate, and blood oxygen level will be monitored until you   leave the hospital or clinic.  You may be given something to eat and drink.  You may have bruising or mild discomfort in the area where the IV was inserted. Follow these instructions at home: Eating and drinking   Follow instructions from your health care provider about eating or drinking restrictions.  Drink a lot of water for the first several days after the procedure, as directed by your health care provider. This helps to flush the  contrast out of your body. Activity  Rest as told by your health care provider.  Return to your normal activities as told by your health care provider. Ask your health care provider what activities are safe for you.  If you were given a sedative during your procedure, do not drive for 24 hours or until your health care provider approves. General instructions  Check your IV insertion area every day for signs of infection. Check for: ? Redness, swelling, or pain. ? Fluid or blood. ? Warmth. ? Pus or a bad smell.  Take over-the-counter and prescription medicines only as told by your health care provider.  Keep all follow-up visits as told by your health care provider. This is important. Contact a health care provider if:  Your skin becomes itchy or you develop a rash or hives.  You have a fever that does not get better with medicine.  You feel nauseous or you vomit.  You have redness, swelling, or pain around the insertion site.  You have fluid or blood coming from the insertion site.  Your insertion area feels warm to the touch.  You have pus or a bad smell coming from the insertion site. Get help right away if you:  Have shortness of breath or difficulty breathing.  Develop chest pain.  Faint.  Feel very dizzy. These symptoms may represent a serious problem that is an emergency. Do not wait to see if the symptoms will go away. Get medical help right away. Call your local emergency services (911 in the [place] Do not drive yourself to the hospital. Summary  A venogram, or venography, is a procedure that uses an X-ray and contrast dye to check how well the veins work and how blood flows through them.  An IV will be inserted into one of your veins in order to inject the contrast.  During the exam, you will lie on an X-ray table. The table may be tilted in different directions during the procedure to help the contrast move throughout your body. Safety straps will keep you  secure.  After the procedure, you will need to drink a lot of water to help wash or flush the contrast out of your body. This information is not intended to replace advice given to you by your health care provider. Make sure you discuss any questions you have with your health care provider. Document Revised: 06/28/2019 Document Reviewed: 06/28/2019 Elsevier Patient Education  2020 Elsevier Inc.  

## 2020-07-30 NOTE — Op Note (Signed)
   PATIENT: David [name]      MRN: [id number] DOB: 1941/10/03    DATE OF PROCEDURE: 07/30/2020  INDICATIONS:    Andres Shad Tamer is a 79 y.o. male who is being evaluated for new hemodialysis access.  He has had thigh grafts bilaterally and arm access bilaterally.  He presents for venography to determine if he is a candidate for upper arm access again.  PROCEDURE:    Bilateral central venogram  SURGEON: Judeth Cornfield. Scot Dock, MD, FACS  ANESTHESIA: None  EBL: Minimal  TECHNIQUE:  In short stay the patient had a peripheral IV started in the right forearm and in the left upper arm.  The patient was then brought to the [facility] lab.  Contrast was injected through the IV in the right arm and the vein was evaluated from the upper forearm to include the central veins.  Next IV contrast was injected in the left IV and again the veins were evaluated in the upper arm and to include the central veins.   FINDINGS:   1.  On the right side the brachial vein is patent from the upper forearm up to the axillary vein.  The axillary and subclavian veins are patent.  There is some mild narrowing in the subclavian vein likely related to the right IJ tunneled dialysis catheter. 2.  On the left side, there appears to be some narrowing in the brachiocephalic vein centrally with extensive collaterals.   CLINICAL NOTE: The patient appears to be a candidate for an AV graft in the right arm.  I would recommend trying to place an IJ tunneled dialysis catheter on the left.  If this is possible then we will remove the right IJ catheter and place a right arm graft.  If we cannot place a catheter in the left IJ then I would leave the right IJ catheter and still place a graft in the arm although he will be at increased risk for swelling in that arm until we can get the catheter out.  We will try to schedule this on a Wednesday or Friday to work around his dialysis schedule.  We will have to hold his Xarelto for 48  hours prior to the procedure.   [name], MD, FACS [facility]  DATE OF DICTATION:   07/30/2020

## 2020-07-30 NOTE — Interval H&P Note (Signed)
History and Physical Interval Note:  07/30/2020 9:03 AM  [place]  has presented today for surgery, with the diagnosis of INSTAGE RENAL.  The various methods of treatment have been discussed with the patient and family. After consideration of risks, benefits and other options for treatment, the patient has consented to  Procedure(s): CENTRAL VENOGRAM (N/A) as a surgical intervention.  The patient's history has been reviewed, patient examined, no change in status, stable for surgery.  I have reviewed the patient's chart and labs.  Questions were answered to the patient's satisfaction.     [name]

## 2020-07-31 DIAGNOSIS — Z992 Dependence on renal dialysis: Secondary | ICD-10-CM | POA: Diagnosis not present

## 2020-07-31 DIAGNOSIS — N186 End stage renal disease: Secondary | ICD-10-CM | POA: Diagnosis not present

## 2020-07-31 DIAGNOSIS — N2581 Secondary hyperparathyroidism of renal origin: Secondary | ICD-10-CM | POA: Diagnosis not present

## 2020-07-31 DIAGNOSIS — E8770 Fluid overload, unspecified: Secondary | ICD-10-CM | POA: Diagnosis not present

## 2020-08-02 DIAGNOSIS — Z992 Dependence on renal dialysis: Secondary | ICD-10-CM | POA: Diagnosis not present

## 2020-08-02 DIAGNOSIS — N186 End stage renal disease: Secondary | ICD-10-CM | POA: Diagnosis not present

## 2020-08-02 DIAGNOSIS — N2581 Secondary hyperparathyroidism of renal origin: Secondary | ICD-10-CM | POA: Diagnosis not present

## 2020-08-02 DIAGNOSIS — E8770 Fluid overload, unspecified: Secondary | ICD-10-CM | POA: Diagnosis not present

## 2020-08-03 DIAGNOSIS — Z992 Dependence on renal dialysis: Secondary | ICD-10-CM | POA: Diagnosis not present

## 2020-08-03 DIAGNOSIS — N2581 Secondary hyperparathyroidism of renal origin: Secondary | ICD-10-CM | POA: Diagnosis not present

## 2020-08-03 DIAGNOSIS — B20 Human immunodeficiency virus [HIV] disease: Secondary | ICD-10-CM | POA: Diagnosis not present

## 2020-08-03 DIAGNOSIS — N186 End stage renal disease: Secondary | ICD-10-CM | POA: Diagnosis not present

## 2020-08-03 DIAGNOSIS — E8770 Fluid overload, unspecified: Secondary | ICD-10-CM | POA: Diagnosis not present

## 2020-08-05 ENCOUNTER — Other Ambulatory Visit: Payer: Self-pay

## 2020-08-05 DIAGNOSIS — E8770 Fluid overload, unspecified: Secondary | ICD-10-CM | POA: Diagnosis not present

## 2020-08-05 DIAGNOSIS — N186 End stage renal disease: Secondary | ICD-10-CM | POA: Diagnosis not present

## 2020-08-05 DIAGNOSIS — N2581 Secondary hyperparathyroidism of renal origin: Secondary | ICD-10-CM | POA: Diagnosis not present

## 2020-08-05 DIAGNOSIS — Z992 Dependence on renal dialysis: Secondary | ICD-10-CM | POA: Diagnosis not present

## 2020-08-07 DIAGNOSIS — N2581 Secondary hyperparathyroidism of renal origin: Secondary | ICD-10-CM | POA: Diagnosis not present

## 2020-08-07 DIAGNOSIS — E8770 Fluid overload, unspecified: Secondary | ICD-10-CM | POA: Diagnosis not present

## 2020-08-07 DIAGNOSIS — Z992 Dependence on renal dialysis: Secondary | ICD-10-CM | POA: Diagnosis not present

## 2020-08-07 DIAGNOSIS — N186 End stage renal disease: Secondary | ICD-10-CM | POA: Diagnosis not present

## 2020-08-09 DIAGNOSIS — N2581 Secondary hyperparathyroidism of renal origin: Secondary | ICD-10-CM | POA: Diagnosis not present

## 2020-08-09 DIAGNOSIS — Z992 Dependence on renal dialysis: Secondary | ICD-10-CM | POA: Diagnosis not present

## 2020-08-09 DIAGNOSIS — N186 End stage renal disease: Secondary | ICD-10-CM | POA: Diagnosis not present

## 2020-08-09 DIAGNOSIS — E8770 Fluid overload, unspecified: Secondary | ICD-10-CM | POA: Diagnosis not present

## 2020-08-10 ENCOUNTER — Other Ambulatory Visit (HOSPITAL_COMMUNITY)
Admission: RE | Admit: 2020-08-10 | Discharge: 2020-08-10 | Disposition: A | Discharge status: Home / Self Care | Payer: Medicare HMO | Source: Ambulatory Visit | Attending: Vascular Surgery | Admitting: Vascular Surgery

## 2020-08-10 ENCOUNTER — Encounter: Payer: Self-pay | Admitting: Infectious Disease

## 2020-08-10 DIAGNOSIS — Z20822 Contact with and (suspected) exposure to covid-19: Secondary | ICD-10-CM | POA: Diagnosis not present

## 2020-08-10 DIAGNOSIS — E8770 Fluid overload, unspecified: Secondary | ICD-10-CM | POA: Diagnosis not present

## 2020-08-10 DIAGNOSIS — Z01812 Encounter for preprocedural laboratory examination: Secondary | ICD-10-CM | POA: Insufficient documentation

## 2020-08-10 DIAGNOSIS — N2581 Secondary hyperparathyroidism of renal origin: Secondary | ICD-10-CM | POA: Diagnosis not present

## 2020-08-10 DIAGNOSIS — Z992 Dependence on renal dialysis: Secondary | ICD-10-CM | POA: Diagnosis not present

## 2020-08-10 DIAGNOSIS — N186 End stage renal disease: Secondary | ICD-10-CM | POA: Diagnosis not present

## 2020-08-10 LAB — SARS CORONAVIRUS 2 (TAT 6-24 HRS): SARS Coronavirus 2: NEGATIVE

## 2020-08-12 ENCOUNTER — Other Ambulatory Visit: Payer: Self-pay

## 2020-08-12 ENCOUNTER — Encounter (HOSPITAL_COMMUNITY): Payer: Self-pay | Admitting: Vascular Surgery

## 2020-08-12 DIAGNOSIS — C61 Malignant neoplasm of prostate: Secondary | ICD-10-CM | POA: Diagnosis not present

## 2020-08-12 DIAGNOSIS — E8770 Fluid overload, unspecified: Secondary | ICD-10-CM | POA: Diagnosis not present

## 2020-08-12 DIAGNOSIS — N186 End stage renal disease: Secondary | ICD-10-CM | POA: Diagnosis not present

## 2020-08-12 DIAGNOSIS — N2581 Secondary hyperparathyroidism of renal origin: Secondary | ICD-10-CM | POA: Diagnosis not present

## 2020-08-12 DIAGNOSIS — Z992 Dependence on renal dialysis: Secondary | ICD-10-CM | POA: Diagnosis not present

## 2020-08-12 NOTE — Progress Notes (Signed)
Spoke with pt for pre-op call. Pt has hx of A-fib and is on Xarelto. Pt was instructed to hold Xarelto 48 hours prior to surgery by Dr. Scot Dock. Last dose was Tuesday, 08/10/20. Pt's cardiologist is [name], last office visit was 06/18/20. Pt states he is not diabetic.   Covid test done 08/10/20 and it's negative. Pt states he's been in quarantine since the test was done except to go to Dialysis. He understands he stays in quarantine until he comes to the hospital tomorrow.

## 2020-08-13 ENCOUNTER — Encounter (HOSPITAL_COMMUNITY): Payer: Self-pay | Admitting: Vascular Surgery

## 2020-08-13 ENCOUNTER — Ambulatory Visit (HOSPITAL_COMMUNITY): Payer: Medicare HMO

## 2020-08-13 ENCOUNTER — Ambulatory Visit (HOSPITAL_COMMUNITY): Payer: Medicare HMO | Admitting: Anesthesiology

## 2020-08-13 ENCOUNTER — Ambulatory Visit (HOSPITAL_COMMUNITY)
Admission: RE | Admit: 2020-08-13 | Discharge: 2020-08-13 | Disposition: A | Discharge status: Home / Self Care | Payer: Medicare HMO | Attending: Vascular Surgery | Admitting: Vascular Surgery

## 2020-08-13 ENCOUNTER — Encounter (HOSPITAL_COMMUNITY)
Admission: RE | Disposition: A | Discharge status: Home / Self Care | Payer: Self-pay | Source: Home / Self Care | Attending: Vascular Surgery

## 2020-08-13 DIAGNOSIS — I509 Heart failure, unspecified: Secondary | ICD-10-CM | POA: Insufficient documentation

## 2020-08-13 DIAGNOSIS — Z8674 Personal history of sudden cardiac arrest: Secondary | ICD-10-CM | POA: Diagnosis not present

## 2020-08-13 DIAGNOSIS — I43 Cardiomyopathy in diseases classified elsewhere: Secondary | ICD-10-CM | POA: Diagnosis not present

## 2020-08-13 DIAGNOSIS — I132 Hypertensive heart and chronic kidney disease with heart failure and with stage 5 chronic kidney disease, or end stage renal disease: Secondary | ICD-10-CM | POA: Insufficient documentation

## 2020-08-13 DIAGNOSIS — N186 End stage renal disease: Secondary | ICD-10-CM | POA: Diagnosis not present

## 2020-08-13 DIAGNOSIS — Z8616 Personal history of COVID-19: Secondary | ICD-10-CM | POA: Insufficient documentation

## 2020-08-13 DIAGNOSIS — I4891 Unspecified atrial fibrillation: Secondary | ICD-10-CM | POA: Diagnosis not present

## 2020-08-13 DIAGNOSIS — G4730 Sleep apnea, unspecified: Secondary | ICD-10-CM | POA: Diagnosis not present

## 2020-08-13 DIAGNOSIS — Z21 Asymptomatic human immunodeficiency virus [HIV] infection status: Secondary | ICD-10-CM | POA: Insufficient documentation

## 2020-08-13 DIAGNOSIS — E1122 Type 2 diabetes mellitus with diabetic chronic kidney disease: Secondary | ICD-10-CM | POA: Diagnosis not present

## 2020-08-13 DIAGNOSIS — Z8546 Personal history of malignant neoplasm of prostate: Secondary | ICD-10-CM | POA: Diagnosis not present

## 2020-08-13 DIAGNOSIS — I2510 Atherosclerotic heart disease of native coronary artery without angina pectoris: Secondary | ICD-10-CM | POA: Diagnosis not present

## 2020-08-13 DIAGNOSIS — Z79899 Other long term (current) drug therapy: Secondary | ICD-10-CM | POA: Diagnosis not present

## 2020-08-13 DIAGNOSIS — Z419 Encounter for procedure for purposes other than remedying health state, unspecified: Secondary | ICD-10-CM

## 2020-08-13 DIAGNOSIS — Z7901 Long term (current) use of anticoagulants: Secondary | ICD-10-CM | POA: Insufficient documentation

## 2020-08-13 DIAGNOSIS — J449 Chronic obstructive pulmonary disease, unspecified: Secondary | ICD-10-CM | POA: Insufficient documentation

## 2020-08-13 DIAGNOSIS — I120 Hypertensive chronic kidney disease with stage 5 chronic kidney disease or end stage renal disease: Secondary | ICD-10-CM | POA: Insufficient documentation

## 2020-08-13 DIAGNOSIS — M109 Gout, unspecified: Secondary | ICD-10-CM | POA: Diagnosis not present

## 2020-08-13 DIAGNOSIS — Z992 Dependence on renal dialysis: Secondary | ICD-10-CM | POA: Insufficient documentation

## 2020-08-13 DIAGNOSIS — D649 Anemia, unspecified: Secondary | ICD-10-CM | POA: Insufficient documentation

## 2020-08-13 DIAGNOSIS — T82898A Other specified complication of vascular prosthetic devices, implants and grafts, initial encounter: Secondary | ICD-10-CM | POA: Diagnosis not present

## 2020-08-13 DIAGNOSIS — Z8673 Personal history of transient ischemic attack (TIA), and cerebral infarction without residual deficits: Secondary | ICD-10-CM | POA: Insufficient documentation

## 2020-08-13 DIAGNOSIS — D631 Anemia in chronic kidney disease: Secondary | ICD-10-CM | POA: Diagnosis not present

## 2020-08-13 DIAGNOSIS — Z87891 Personal history of nicotine dependence: Secondary | ICD-10-CM | POA: Diagnosis not present

## 2020-08-13 DIAGNOSIS — I517 Cardiomegaly: Secondary | ICD-10-CM | POA: Diagnosis not present

## 2020-08-13 DIAGNOSIS — I5043 Acute on chronic combined systolic (congestive) and diastolic (congestive) heart failure: Secondary | ICD-10-CM | POA: Diagnosis not present

## 2020-08-13 HISTORY — PX: REMOVAL OF A DIALYSIS CATHETER: SHX6053

## 2020-08-13 HISTORY — PX: AV FISTULA PLACEMENT: SHX1204

## 2020-08-13 HISTORY — PX: INSERTION OF DIALYSIS CATHETER: SHX1324

## 2020-08-13 LAB — POCT I-STAT, CHEM 8
BUN: 36 mg/dL — ABNORMAL HIGH (ref 8–23)
Calcium, Ion: 1.13 mmol/L — ABNORMAL LOW (ref 1.15–1.40)
Chloride: 102 mmol/L (ref 98–111)
Creatinine, Ser: 7.3 mg/dL — ABNORMAL HIGH (ref 0.61–1.24)
Glucose, Bld: 101 mg/dL — ABNORMAL HIGH (ref 70–99)
HCT: 37 % — ABNORMAL LOW (ref 39.0–52.0)
Hemoglobin: 12.6 g/dL — ABNORMAL LOW (ref 13.0–17.0)
Potassium: 4.9 mmol/L (ref 3.5–5.1)
Sodium: 141 mmol/L (ref 135–145)
TCO2: 28 mmol/L (ref 22–32)

## 2020-08-13 SURGERY — INSERTION OF ARTERIOVENOUS (AV) GORE-TEX GRAFT ARM
Anesthesia: Monitor Anesthesia Care | Site: Chest | Laterality: Right

## 2020-08-13 MED ORDER — FENTANYL CITRATE (PF) 250 MCG/5ML IJ SOLN
INTRAMUSCULAR | Status: AC
Start: 2020-08-13 — End: ?
  Filled 2020-08-13: qty 5

## 2020-08-13 MED ORDER — LIDOCAINE HCL (PF) 1 % IJ SOLN
INTRAMUSCULAR | Status: DC | PRN
  Administered 2020-08-13: 15 mL

## 2020-08-13 MED ORDER — SODIUM CHLORIDE 0.9 % IV SOLN
INTRAVENOUS | Status: DC | PRN
  Administered 2020-08-13: 11:00:00 500 mL

## 2020-08-13 MED ORDER — 0.9 % SODIUM CHLORIDE (POUR BTL) OPTIME
TOPICAL | Status: DC | PRN
  Administered 2020-08-13: 1000 mL

## 2020-08-13 MED ORDER — CHLORHEXIDINE GLUCONATE 4 % EX LIQD: 60.0000 mL | Freq: Once | CUTANEOUS | Status: DC

## 2020-08-13 MED ORDER — MIDAZOLAM HCL 2 MG/2ML IJ SOLN
INTRAMUSCULAR | Status: AC
  Filled 2020-08-13: qty 2

## 2020-08-13 MED ORDER — HEPARIN SODIUM (PORCINE) 1000 UNIT/ML IJ SOLN
INTRAMUSCULAR | Status: AC
  Filled 2020-08-13: qty 1

## 2020-08-13 MED ORDER — CEFAZOLIN SODIUM-DEXTROSE 2-4 GM/100ML-% IV SOLN
2.0000 g | INTRAVENOUS | Status: AC
  Administered 2020-08-13: 2 g via INTRAVENOUS

## 2020-08-13 MED ORDER — CHLORHEXIDINE GLUCONATE 0.12 % MT SOLN: 15.0000 mL | Freq: Once | OROMUCOSAL | Status: AC

## 2020-08-13 MED ORDER — ONDANSETRON HCL 4 MG/2ML IJ SOLN
INTRAMUSCULAR | Status: AC
  Filled 2020-08-13: qty 2

## 2020-08-13 MED ORDER — OXYCODONE HCL 5 MG/5ML PO SOLN: 5.0000 mg | Freq: Once | ORAL | Status: DC | PRN

## 2020-08-13 MED ORDER — MIDAZOLAM HCL 5 MG/5ML IJ SOLN
INTRAMUSCULAR | Status: DC | PRN
  Administered 2020-08-13: .5 mg via INTRAVENOUS

## 2020-08-13 MED ORDER — LIDOCAINE 2% (20 MG/ML) 5 ML SYRINGE
INTRAMUSCULAR | Status: AC
  Filled 2020-08-13: qty 5

## 2020-08-13 MED ORDER — CEFAZOLIN SODIUM-DEXTROSE 2-4 GM/100ML-% IV SOLN
INTRAVENOUS | Status: AC
  Filled 2020-08-13: qty 100

## 2020-08-13 MED ORDER — ACETAMINOPHEN 160 MG/5ML PO SOLN: 1000.0000 mg | Freq: Once | ORAL | Status: DC | PRN

## 2020-08-13 MED ORDER — PAPAVERINE HCL 30 MG/ML IJ SOLN
INTRAMUSCULAR | Status: DC | PRN
  Administered 2020-08-13: 60 mg via INTRAVENOUS

## 2020-08-13 MED ORDER — FENTANYL CITRATE (PF) 100 MCG/2ML IJ SOLN: 25.0000 ug | INTRAMUSCULAR | Status: DC | PRN

## 2020-08-13 MED ORDER — SODIUM CHLORIDE 0.9 % IV SOLN
INTRAVENOUS | Status: AC
  Filled 2020-08-13: qty 1.2

## 2020-08-13 MED ORDER — ACETAMINOPHEN 500 MG PO TABS: 1000.0000 mg | ORAL_TABLET | Freq: Once | ORAL | Status: DC | PRN

## 2020-08-13 MED ORDER — CHLORHEXIDINE GLUCONATE 0.12 % MT SOLN
OROMUCOSAL | Status: AC
  Administered 2020-08-13: 15 mL via OROMUCOSAL
  Filled 2020-08-13: qty 15

## 2020-08-13 MED ORDER — LIDOCAINE-EPINEPHRINE (PF) 1 %-1:200000 IJ SOLN
INTRAMUSCULAR | Status: AC
  Filled 2020-08-13: qty 30

## 2020-08-13 MED ORDER — ALBUMIN HUMAN 5 % IV SOLN
INTRAVENOUS | Status: AC
  Filled 2020-08-13: qty 250

## 2020-08-13 MED ORDER — SODIUM CHLORIDE 0.9 % IV SOLN: INTRAVENOUS | Status: DC

## 2020-08-13 MED ORDER — PAPAVERINE HCL 30 MG/ML IJ SOLN
INTRAMUSCULAR | Status: AC
  Filled 2020-08-13: qty 2

## 2020-08-13 MED ORDER — LIDOCAINE HCL (PF) 1 % IJ SOLN
INTRAMUSCULAR | Status: AC
  Filled 2020-08-13: qty 30

## 2020-08-13 MED ORDER — PROPOFOL 10 MG/ML IV BOLUS
INTRAVENOUS | Status: AC
  Filled 2020-08-13: qty 20

## 2020-08-13 MED ORDER — PROTAMINE SULFATE 10 MG/ML IV SOLN
INTRAVENOUS | Status: AC
  Filled 2020-08-13: qty 5

## 2020-08-13 MED ORDER — OXYCODONE HCL 5 MG PO TABS: 5.0000 mg | ORAL_TABLET | ORAL | 0 refills | Status: DC | PRN

## 2020-08-13 MED ORDER — THROMBIN (RECOMBINANT) 20000 UNITS EX SOLR
CUTANEOUS | Status: AC
  Filled 2020-08-13: qty 20000

## 2020-08-13 MED ORDER — ONDANSETRON HCL 4 MG/2ML IJ SOLN
INTRAMUSCULAR | Status: DC | PRN
  Administered 2020-08-13: 4 mg via INTRAVENOUS

## 2020-08-13 MED ORDER — ACETAMINOPHEN 10 MG/ML IV SOLN: 1000.0000 mg | Freq: Once | INTRAVENOUS | Status: DC | PRN

## 2020-08-13 MED ORDER — PROTAMINE SULFATE 10 MG/ML IV SOLN
INTRAVENOUS | Status: DC | PRN
  Administered 2020-08-13 (×2): 10 mg via INTRAVENOUS
  Administered 2020-08-13: 20 mg via INTRAVENOUS

## 2020-08-13 MED ORDER — DEXMEDETOMIDINE (PRECEDEX) IN NS 20 MCG/5ML (4 MCG/ML) IV SYRINGE
PREFILLED_SYRINGE | INTRAVENOUS | Status: AC
  Filled 2020-08-13: qty 5

## 2020-08-13 MED ORDER — PHENYLEPHRINE HCL-NACL 10-0.9 MG/250ML-% IV SOLN
INTRAVENOUS | Status: DC | PRN
  Administered 2020-08-13: 25 ug/min via INTRAVENOUS

## 2020-08-13 MED ORDER — DEXMEDETOMIDINE HCL IN NACL 400 MCG/100ML IV SOLN
INTRAVENOUS | Status: DC | PRN
  Administered 2020-08-13: .5 ug/kg/h via INTRAVENOUS

## 2020-08-13 MED ORDER — HEPARIN SODIUM (PORCINE) 1000 UNIT/ML IJ SOLN
INTRAMUSCULAR | Status: DC | PRN
  Administered 2020-08-13: 4.2 [IU] via INTRAVENOUS

## 2020-08-13 MED ORDER — ALBUMIN HUMAN 5 % IV SOLN
12.5000 g | Freq: Once | INTRAVENOUS | Status: AC
  Administered 2020-08-13: 12.5 g via INTRAVENOUS

## 2020-08-13 MED ORDER — LIDOCAINE 2% (20 MG/ML) 5 ML SYRINGE
INTRAMUSCULAR | Status: DC | PRN
  Administered 2020-08-13 (×2): 40 mg via INTRAVENOUS

## 2020-08-13 MED ORDER — LIDOCAINE-EPINEPHRINE (PF) 1 %-1:200000 IJ SOLN
INTRAMUSCULAR | Status: DC | PRN
  Administered 2020-08-13 (×2): 30 mL

## 2020-08-13 MED ORDER — OXYCODONE HCL 5 MG PO TABS: 5.0000 mg | ORAL_TABLET | Freq: Once | ORAL | Status: DC | PRN

## 2020-08-13 MED ORDER — HEPARIN SODIUM (PORCINE) 1000 UNIT/ML IJ SOLN
INTRAMUSCULAR | Status: DC | PRN
  Administered 2020-08-13: 8000 [IU] via INTRAVENOUS

## 2020-08-13 MED ORDER — ORAL CARE MOUTH RINSE: 15.0000 mL | Freq: Once | OROMUCOSAL | Status: AC

## 2020-08-13 SURGICAL SUPPLY — 75 items
ADH SKN CLS APL DERMABOND .7 (GAUZE/BANDAGES/DRESSINGS) ×9
APL PRP STRL LF DISP 70% ISPRP (MISCELLANEOUS) ×3
ARMBAND PINK RESTRICT EXTREMIT (MISCELLANEOUS) ×10 IMPLANT
BAG DECANTER FOR FLEXI CONT (MISCELLANEOUS) ×5 IMPLANT
BIOPATCH RED 1 DISK 7.0 (GAUZE/BANDAGES/DRESSINGS) ×4 IMPLANT
BIOPATCH RED 1IN DISK 7.0MM (GAUZE/BANDAGES/DRESSINGS) ×1
CANISTER SUCT 3000ML PPV (MISCELLANEOUS) ×5 IMPLANT
CANNULA VESSEL 3MM 2 BLNT TIP (CANNULA) ×7 IMPLANT
CATH PALINDROME-P 19CM W/VT (CATHETERS) IMPLANT
CATH PALINDROME-P 23CM W/VT (CATHETERS) IMPLANT
CATH PALINDROME-P 28CM W/VT (CATHETERS) ×4 IMPLANT
CATH STRAIGHT 5FR 65CM (CATHETERS) ×4 IMPLANT
CHLORAPREP W/TINT 26 (MISCELLANEOUS) ×5 IMPLANT
CLIP VESOCCLUDE MED 6/CT (CLIP) ×5 IMPLANT
CLIP VESOCCLUDE SM WIDE 6/CT (CLIP) ×5 IMPLANT
COVER PROBE W GEL 5X96 (DRAPES) IMPLANT
COVER SURGICAL LIGHT HANDLE (MISCELLANEOUS) ×7 IMPLANT
COVER WAND RF STERILE (DRAPES) ×3 IMPLANT
DECANTER SPIKE VIAL GLASS SM (MISCELLANEOUS) ×7 IMPLANT
DERMABOND ADVANCED (GAUZE/BANDAGES/DRESSINGS) ×6
DERMABOND ADVANCED .7 DNX12 (GAUZE/BANDAGES/DRESSINGS) IMPLANT
DEVICE TORQUE KENDALL .025-038 (MISCELLANEOUS) ×2 IMPLANT
DRAPE C-ARM 42X72 X-RAY (DRAPES) ×5 IMPLANT
DRAPE CHEST BREAST 15X10 FENES (DRAPES) ×5 IMPLANT
ELECT REM PT RETURN 9FT ADLT (ELECTROSURGICAL) ×5
ELECTRODE REM PT RTRN 9FT ADLT (ELECTROSURGICAL) ×3 IMPLANT
GAUZE 4X4 16PLY RFD (DISPOSABLE) ×5 IMPLANT
GAUZE SPONGE 2X2 8PLY STRL LF (GAUZE/BANDAGES/DRESSINGS) ×3 IMPLANT
GAUZE SPONGE 4X4 16PLY XRAY LF (GAUZE/BANDAGES/DRESSINGS) ×2 IMPLANT
GLOVE BIO SURGEON STRL SZ 6.5 (GLOVE) ×3 IMPLANT
GLOVE BIO SURGEON STRL SZ7.5 (GLOVE) ×5 IMPLANT
GLOVE BIO SURGEONS STRL SZ 6.5 (GLOVE) ×3
GLOVE BIOGEL PI IND STRL 6.5 (GLOVE) IMPLANT
GLOVE BIOGEL PI IND STRL 8 (GLOVE) ×3 IMPLANT
GLOVE BIOGEL PI INDICATOR 6.5 (GLOVE) ×6
GLOVE BIOGEL PI INDICATOR 8 (GLOVE) ×2
GOWN STRL REUS W/ TWL LRG LVL3 (GOWN DISPOSABLE) ×9 IMPLANT
GOWN STRL REUS W/TWL LRG LVL3 (GOWN DISPOSABLE) ×20
GRAFT GORETEX STRT 4-7X45 (Vascular Products) ×2 IMPLANT
GUIDEWIRE ANGLED .035X150CM (WIRE) ×2 IMPLANT
KIT BASIN OR (CUSTOM PROCEDURE TRAY) ×5 IMPLANT
KIT PALINDROME-P 55CM (CATHETERS) IMPLANT
KIT TURNOVER KIT B (KITS) ×5 IMPLANT
NDL 18GX1X1/2 (RX/OR ONLY) (NEEDLE) ×3 IMPLANT
NDL HYPO 25GX1X1/2 BEV (NEEDLE) ×3 IMPLANT
NEEDLE 18GX1X1/2 (RX/OR ONLY) (NEEDLE) ×5 IMPLANT
NEEDLE 22X1 1/2 (OR ONLY) (NEEDLE) ×5 IMPLANT
NEEDLE HYPO 25GX1X1/2 BEV (NEEDLE) ×5 IMPLANT
NS IRRIG 1000ML POUR BTL (IV SOLUTION) ×5 IMPLANT
PACK CV ACCESS (CUSTOM PROCEDURE TRAY) ×5 IMPLANT
PACK SURGICAL SETUP 50X90 (CUSTOM PROCEDURE TRAY) ×5 IMPLANT
PAD ARMBOARD 7.5X6 YLW CONV (MISCELLANEOUS) ×10 IMPLANT
SET MICROPUNCTURE 5F STIFF (MISCELLANEOUS) ×2 IMPLANT
SOAP 2 % CHG 4 OZ (WOUND CARE) ×5 IMPLANT
SPONGE GAUZE 2X2 STER 10/PKG (GAUZE/BANDAGES/DRESSINGS) ×2
SPONGE LAP 18X18 RF (DISPOSABLE) ×2 IMPLANT
SPONGE SURGIFOAM ABS GEL 100 (HEMOSTASIS) IMPLANT
SUT ETHILON 3 0 PS 1 (SUTURE) ×5 IMPLANT
SUT PROLENE 6 0 BV (SUTURE) ×18 IMPLANT
SUT VIC AB 3-0 SH 27 (SUTURE) ×15
SUT VIC AB 3-0 SH 27X BRD (SUTURE) ×6 IMPLANT
SUT VICRYL 4-0 PS2 18IN ABS (SUTURE) ×14 IMPLANT
SYR 10ML LL (SYRINGE) ×5 IMPLANT
SYR 20ML LL LF (SYRINGE) ×10 IMPLANT
SYR 30ML LL (SYRINGE) IMPLANT
SYR 5ML LL (SYRINGE) ×12 IMPLANT
SYR BULB IRRIG 60ML STRL (SYRINGE) ×2 IMPLANT
SYR CONTROL 10ML LL (SYRINGE) ×5 IMPLANT
SYR TOOMEY 50ML (SYRINGE) IMPLANT
TOWEL GREEN STERILE (TOWEL DISPOSABLE) ×10 IMPLANT
TOWEL GREEN STERILE FF (TOWEL DISPOSABLE) ×5 IMPLANT
UNDERPAD 30X36 HEAVY ABSORB (UNDERPADS AND DIAPERS) ×5 IMPLANT
WATER STERILE IRR 1000ML POUR (IV SOLUTION) ×5 IMPLANT
WIRE AMPLATZ SS-J .035X180CM (WIRE) ×2 IMPLANT
WIRE BENTSON .035X145CM (WIRE) ×2 IMPLANT

## 2020-08-13 NOTE — Op Note (Signed)
NAME: David [name]    MRN: [id number] DOB: [birth year]    DATE OF OPERATION: 08/13/2020  PREOP DIAGNOSIS:    End-stage renal disease  POSTOP DIAGNOSIS:    Same  PROCEDURE:    1.  Ultrasound-guided placement of left IJ tunneled dialysis catheter 2.  Removal of right IJ tunneled dialysis catheter 3.  Right upper arm AV graft  SURGEON: Judeth Cornfield. Scot Dock, MD  ASSIST: Luanne Bras, RNFA  ANESTHESIA: Local with sedation  EBL: Minimal  INDICATIONS:    [facility] is a 79 y.o. male who has had access in both arms and also in both eyes.  He was running out of options.  After a central venogram it appeared that he had a patent vein in the upper arm on the right although it had a previous graft there also and also a basilic vein transposition.  He presents for new access.  Given that the right IJ catheter could potentially complicate outflow I felt that if I could move the catheter to the left I would do that also.  FINDINGS:   The left IJ was small and somewhat sclerotic but I was able to get a catheter on the left.  The graft had a good thrill at the completion with a strong radial signal with a Doppler.  TECHNIQUE:   The patient was taken to the operative room and sedated by anesthesia.  I looked at the left IJ with the ultrasound and it was small and somewhat sclerotic but I thought it was worth trying to get a catheter on the left.  The neck and upper chest were prepped and draped in usual sterile fashion.  Under ultrasound guidance, after the skin was anesthetized, I cannulated the left IJ with a micropuncture needle and a micropuncture sheath was introduced over a wire.  The Bentson wire would not advance into the superior vena cava but I was able to get an angled Glidewire down into the superior vena cava.  I then exchanged this for the Bentson wire over a straight catheter.  The exit site for the 28 cm catheter was selected and the skin anesthetized between  the 2 areas.  The catheter was brought through the tunnel.  I then dilated the tract over the wire and passed the dilator and peel-away sheath.  The peel-away sheath would not go down into the superior vena cava over the Bentson wire.  Once the peel-away sheath was removed the catheter would not advance down into the superior vena cava.  Given that this was the palindrome kit I elected to divide this in place a wire through the catheter distally and advanced this down into the right atrium.  I then exchanged for an Amplatz wire through a straight catheter again.  Now with a stronger support wire I retunneled the 28 cm catheter.  I was able to pass the dilator and peel-away sheath down into the right atrium.  I then advanced the small catheters that went through the ports of the palindrome to allow me to advance the catheter over a wire.  I was able to get the catheter now down into the right atrium.  The Amplatz wire was removed.  I then remove the right IJ catheter after the skin was anesthetized to be sure that the left IJ catheter did not move.  Pressure was held for hemostasis.  I had to make a separate incision over the cuff.  This incision was closed with a 4-0 Vicryl.  Both ports of the left IJ catheter worked well.  The catheter was secured at its exit site with a 3-0 nylon suture.  The IJ cannulation site was closed with a 4-0 subcuticular stitch.  Sterile dressing was applied.  Attention was then turned to the right arm.  The right arm was prepped and draped in usual sterile fashion.  A longitudinal incision was made just above the antecubital over the brachial artery and brachial vein after the skin was anesthetized.  There was significant scar tissue here and the artery was quite small.  I therefore elected to place an upper arm loop graft.  Separate longitudinal incision was made beneath the axilla.  The patient had a previous basilic vein transposition.  I also identified an AV graft in the upper arm  anastomosed to the axillary vein.  I therefore dissected deeper to find the adjacent axillary vein that could be seen on venogram.  This was a reasonable size vein.  The brachial artery was adjacent to this and this was controlled the vessel loop.  I then tunneled a 4-7 mm PTFE graft in a loop fashion in the right upper arm with the arterial aspect of the graft along the ulnar aspect of the upper arm.  The patient was then heparinized.  The brachial artery was clamped proximally and distally and a longitudinal arteriotomy was made.  A short segment of the 4 mm of the graft was excised, the graft spatulated and sewn end-to-side to the brachial artery using continuous 6-0 Prolene suture.  The graft then pulled the appropriate length for anastomosis to the brachial vein.  The vein was ligated distally and spatulated proximally.  The graft was sewn into into the vein using 2 continuous 6-0 Prolene sutures.  At the completion there was excellent thrill in the graft and a good radial signal with the Doppler.  Hemostasis was obtained in the wounds and the heparin was partially reversed with protamine.  The wounds were then each closed with a deep layer of 3-0 Vicryl and the skin closed with 4-0 Vicryl.  Dermabond was applied.  The patient tolerated the procedure well was transferred to recovery room in stable condition.  All needle and sponge counts were correct.    [name], MD, FACS [facility]  DATE OF DICTATION:   08/13/2020

## 2020-08-13 NOTE — Anesthesia Preprocedure Evaluation (Addendum)
Anesthesia Evaluation  Patient identified by MRN, date of birth, ID band Patient awake    Reviewed: Allergy & Precautions, NPO status , Patient's Chart, lab work & pertinent test results  History of Anesthesia Complications Negative for: history of anesthetic complications  Airway Mallampati: II  TM Distance: >3 FB Neck ROM: Full    Dental  (+) Teeth Intact, Dental Advisory Given   Pulmonary shortness of breath, sleep apnea , COPD, former smoker,  Covid-19 Nucleic Acid Test Results Lab Results      Component                Value               Date                      SARSCOV2NAA              NEGATIVE            08/10/2020                [place]              NEGATIVE            07/27/2020                [place]              NEGATIVE            05/15/2020                [place]              NEGATIVE            11/29/2019                [place]              NEGATIVE            11/17/2019                SARSCOV2                 POSITIVE (A)        12/11/2019                SARSCOV2                 NEGATIVE            11/01/2019              breath sounds clear to auscultation       Cardiovascular hypertension, Pt. on medications + CAD and +CHF  + dysrhythmias + Valvular Problems/Murmurs  Rhythm:Regular     Neuro/Psych CVA    GI/Hepatic PUD, GERD  ,(+) Hepatitis -Lab Results      Component                Value               Date                      ALT                      39                  05/24/2020                AST  18                  05/24/2020                ALKPHOS                  214 (H)             05/24/2020                BILITOT                  0.5                 05/24/2020              Endo/Other  negative endocrine ROS  Renal/GU ESRF and DialysisRenal diseaseLab Results      Component                Value               Date                      CREATININE                7.30 (H)            08/13/2020           Lab Results      Component                Value               Date                      K                        4.9                 08/13/2020             Last HD Thursday 9/9     Musculoskeletal  (+) Arthritis ,   Abdominal   Peds  Hematology  (+) Blood dyscrasia, anemia , HIV, Lab Results      Component                Value               Date                      WBC                      4.7                 06/28/2020                HGB                      12.6 (L)            08/13/2020                HCT                      37.0 (L)            08/13/2020  MCV                      106.9 (H)           06/28/2020                PLT                      183                 06/28/2020              Anesthesia Other Findings   Reproductive/Obstetrics                           Anesthesia Physical Anesthesia Plan  ASA: IV  Anesthesia Plan: MAC   Post-op Pain Management:    Induction: Intravenous  PONV Risk Score and Plan: 1 and Treatment may vary due to age or medical condition  Airway Management Planned: Nasal Cannula  Additional Equipment: None  Intra-op Plan:   Post-operative Plan:   Informed Consent: I have reviewed the patients History and Physical, chart, labs and discussed the procedure including the risks, benefits and alternatives for the proposed anesthesia with the patient or authorized representative who has indicated his/her understanding and acceptance.     Dental advisory given  Plan Discussed with: CRNA and Surgeon  Anesthesia Plan Comments:         Anesthesia Quick Evaluation

## 2020-08-13 NOTE — Transfer of Care (Signed)
Immediate Anesthesia Transfer of Care Note  Patient: David [name]  Procedure(s) Performed: INSERTION OF RIGHT ARTERIOVENOUS (AV) GORE-TEX GRAFT ARM (Right Arm Upper) INSERTION OF DIALYSIS CATHETER (Left Chest) REMOVAL OF A DIALYSIS CATHETER (Right Chest)  Patient Location: PACU  Anesthesia Type:MAC  Level of Consciousness: awake, oriented and patient cooperative  Airway & Oxygen Therapy: Patient Spontanous Breathing and Patient connected to face mask oxygen  Post-op Assessment: Report given to RN and Post -op Vital signs reviewed and stable  Post vital signs: Reviewed  Last Vitals:  Vitals Value Taken Time  BP    Temp    Pulse    Resp    SpO2      Last Pain:  Vitals:   08/13/20 0843  TempSrc:   PainSc: 0-No pain         Complications: No complications documented.

## 2020-08-13 NOTE — Interval H&P Note (Signed)
History and Physical Interval Note:  08/13/2020 10:06 AM  [place]  has presented today for surgery, with the diagnosis of END STAGE RENAL DISEASE.  The various methods of treatment have been discussed with the patient and family. After consideration of risks, benefits and other options for treatment, the patient has consented to  Procedure(s): INSERTION OF ARTERIOVENOUS (AV) GORE-TEX GRAFT ARM (Right) INSERTION OF DIALYSIS CATHETER (Left) REMOVAL OF A DIALYSIS CATHETER (Right) as a surgical intervention.  The patient's history has been reviewed, patient examined, no change in status, stable for surgery.  I have reviewed the patient's chart and labs.  Questions were answered to the patient's satisfaction.     [name]

## 2020-08-13 NOTE — Anesthesia Procedure Notes (Signed)
Procedure Name: MAC Date/Time: 08/13/2020 10:20 AM Performed by: Jenne Campus, CRNA Pre-anesthesia Checklist: Patient identified, Emergency Drugs available, Suction available, Patient being monitored and Timeout performed Oxygen Delivery Method: Nasal cannula and Simple face mask

## 2020-08-14 DIAGNOSIS — Z992 Dependence on renal dialysis: Secondary | ICD-10-CM | POA: Diagnosis not present

## 2020-08-14 DIAGNOSIS — E8770 Fluid overload, unspecified: Secondary | ICD-10-CM | POA: Diagnosis not present

## 2020-08-14 DIAGNOSIS — N2581 Secondary hyperparathyroidism of renal origin: Secondary | ICD-10-CM | POA: Diagnosis not present

## 2020-08-14 DIAGNOSIS — N186 End stage renal disease: Secondary | ICD-10-CM | POA: Diagnosis not present

## 2020-08-16 ENCOUNTER — Telehealth: Payer: Self-pay | Admitting: *Deleted

## 2020-08-16 ENCOUNTER — Encounter (HOSPITAL_COMMUNITY): Payer: Self-pay | Admitting: Vascular Surgery

## 2020-08-16 DIAGNOSIS — N2581 Secondary hyperparathyroidism of renal origin: Secondary | ICD-10-CM | POA: Diagnosis not present

## 2020-08-16 DIAGNOSIS — Z992 Dependence on renal dialysis: Secondary | ICD-10-CM | POA: Diagnosis not present

## 2020-08-16 DIAGNOSIS — E8770 Fluid overload, unspecified: Secondary | ICD-10-CM | POA: Diagnosis not present

## 2020-08-16 DIAGNOSIS — N186 End stage renal disease: Secondary | ICD-10-CM | POA: Diagnosis not present

## 2020-08-16 NOTE — Anesthesia Postprocedure Evaluation (Signed)
Anesthesia Post Note  Patient: [facility]  Procedure(s) Performed: INSERTION OF RIGHT ARTERIOVENOUS (AV) GORE-TEX GRAFT ARM (Right Arm Upper) INSERTION OF DIALYSIS CATHETER (Left Chest) REMOVAL OF A DIALYSIS CATHETER (Right Chest)     Patient location during evaluation: PACU Anesthesia Type: MAC Level of consciousness: awake and alert Pain management: pain level controlled Vital Signs Assessment: post-procedure vital signs reviewed and stable Respiratory status: spontaneous breathing, nonlabored ventilation, respiratory function stable and patient connected to nasal cannula oxygen Cardiovascular status: stable and blood pressure returned to baseline Postop Assessment: no apparent nausea or vomiting Anesthetic complications: no   No complications documented.  Last Vitals:  Vitals:   08/13/20 1445 08/13/20 1515  BP: (!) 96/59 (!) 96/58  Pulse: 61 (!) 58  Resp: 14 14  Temp:  (!) 36.2 C  SpO2: 97% 98%    Last Pain:  Vitals:   08/13/20 1515  TempSrc:   PainSc: 0-No pain                 Laden Fieldhouse

## 2020-08-16 NOTE — Telephone Encounter (Signed)
Received call from Will at [facility] stating patient's AVF site was warm, arm swollen and could not feel a thrill. An appointment was scheduled for 08/18/2020.  I left a message for patient to elevate the arm and to call back if the symptoms worsen.

## 2020-08-17 DIAGNOSIS — E8770 Fluid overload, unspecified: Secondary | ICD-10-CM | POA: Diagnosis not present

## 2020-08-17 DIAGNOSIS — Z992 Dependence on renal dialysis: Secondary | ICD-10-CM | POA: Diagnosis not present

## 2020-08-17 DIAGNOSIS — N186 End stage renal disease: Secondary | ICD-10-CM | POA: Diagnosis not present

## 2020-08-17 DIAGNOSIS — N2581 Secondary hyperparathyroidism of renal origin: Secondary | ICD-10-CM | POA: Diagnosis not present

## 2020-08-18 ENCOUNTER — Other Ambulatory Visit: Payer: Self-pay

## 2020-08-18 ENCOUNTER — Ambulatory Visit (INDEPENDENT_AMBULATORY_CARE_PROVIDER_SITE_OTHER): Payer: Self-pay | Admitting: Physician Assistant

## 2020-08-18 VITALS — BP 115/62 | HR 87 | Temp 98.0°F | Resp 20 | Ht 74.0 in | Wt 192.2 lb

## 2020-08-18 DIAGNOSIS — Z992 Dependence on renal dialysis: Secondary | ICD-10-CM

## 2020-08-18 DIAGNOSIS — R600 Localized edema: Secondary | ICD-10-CM

## 2020-08-18 DIAGNOSIS — N186 End stage renal disease: Secondary | ICD-10-CM

## 2020-08-18 NOTE — Progress Notes (Signed)
POST OPERATIVE DIALYSIS ACCESS OFFICE NOTE    CC:  F/u for dialysis access surgery  HPI:  This is a 79 y.o. male who is s/p Right Upper arm AVG and placement of left IJ TDC by dr. Scot Dock on 08/13/20.  He presents today with reports of right upper extremity edema.  He denies fever, chills or shortness of breath.  He is dialyzing via left IJ TDC without complications.  Dialysis days: Mondays, Tuesdays, Thursdays and Saturdays Dialysis center: [facility]  He is maintained on Xarelto for history of atrial fibrillation Allergies  Allergen Reactions  . Dextromethorphan-Guaifenesin Other (See Comments)    Unknown reaction  . Tocotrienols Other (See Comments)    Unknown reaction  . Losartan Potassium Other (See Comments)    Causes constipation    Current Outpatient Medications  Medication Sig Dispense Refill  . acetaminophen (TYLENOL) 500 MG tablet Take 1,000 mg by mouth every 6 (six) hours as needed for moderate pain.    Marland Kitchen allopurinol (ZYLOPRIM) 100 MG tablet TAKE 1 TABLET BY MOUTH 4 TIMES WEEKLY( MONDAY, TUESDAY,THURDSAY AND SATURDAY) AFTER DIALYSIS (Patient taking differently: Take 100 mg by mouth See admin instructions. Mon., Tues., Thurs., and Saturday from 1800) 45 tablet 1  . amiodarone (PACERONE) 200 MG tablet TAKE 1 TABLET(200 MG) BY MOUTH DAILY (Patient taking differently: Take 200 mg by mouth daily. ) 90 tablet 3  . apixaban (ELIQUIS) 5 MG TABS tablet Take 1 tablet (5 mg total) by mouth 2 (two) times daily. 60 tablet 5  . [name] 50-200-25 MG TABS tablet TAKE 1 TABLET BY MOUTH DAILY 30 tablet 0  . midodrine (PROAMATINE) 10 MG tablet Take 1 tablet (10 mg total) by mouth as directed. Twice a day on the day of HD Tuesday, Thursday, Saturday (1 in AM and 1 at [place]). Take 1/2 tablet (5mg ) a day on non HD days (Patient taking differently: Take 10-20 mg by mouth See admin instructions. Take 10 mg in the morning at 5 AM. take 10 mg at 6AM of dialysis days and 10 mg half  way through treatment on dialysis days (Mon, Tues, Thurs, and Sat)) 90 tablet 5  . montelukast (SINGULAIR) 10 MG tablet TAKE 1 TABLET(10 MG) BY MOUTH AT BEDTIME. FOLLOW UP (Patient taking differently: Take 10 mg by mouth at bedtime. ) 90 tablet 0  . multivitamin (RENA-VIT) TABS tablet Take 1 tablet by mouth every evening.    Marland Kitchen oxyCODONE (ROXICODONE) 5 MG immediate release tablet Take 1 tablet (5 mg total) by mouth every 4 (four) hours as needed. 15 tablet 0  . polyethylene glycol (MIRALAX / GLYCOLAX) 17 g packet Take 17 g by mouth daily as needed for mild constipation or moderate constipation.     . prednisoLONE acetate (PRED FORTE) 1 % ophthalmic suspension Place 1 drop into the right eye in the morning, at noon, and at bedtime.     . sucroferric oxyhydroxide (VELPHORO) 500 MG chewable tablet Chew 500 mg by mouth 3 (three) times daily with meals.     Current Facility-Administered Medications  Medication Dose Route Frequency Provider Last Rate Last Admin  . 0.9 %  sodium chloride infusion  250 mL Intravenous PRN Angelia Mould, MD         ROS:  See HPI  Today's Vitals   08/18/20 0833  BP: 115/62  Pulse: 87  Resp: 20  Temp: 98 F (36.7 C)  TempSrc: Temporal  SpO2: 99%  Weight: 192 lb 3.2 oz (87.2 kg)  Height: 6\' 2"  (1.88 m)   Body mass index is 24.68 kg/m.  Physical Exam:  General appearance: Well-developed, well-nourished in no apparent distress Cardiac: Rate and rhythm are regular with occasional skipped beat Respiratory: Clear to auscultation bilaterally Incision: Both right upper arm incisions are healing without signs of infection Extremities:  5 out of 5 bilateral grip strength.  He has minimal to moderate edema extending from his right hand to right upper.  Hand is warm and well-perfused.  Sensation intact.  Good bruit in graft.  Bilateral central venogram 07/30/2020: 1.  On the right side the brachial vein is patent from the upper forearm up to the axillary vein.   The axillary and subclavian veins are patent.  There is some mild narrowing in the subclavian vein likely related to the right IJ tunneled dialysis catheter. 2.  On the left side, there appears to be some narrowing in the brachiocephalic vein centrally with extensive collaterals.  Assessment/Plan:   Mr. Lunn is a 79 year old male who is undergone multiple previous access procedures including both thighs and left upper extremity.  Currently he is complaining of right upper extremity edema postoperative day 5 placement of right upper arm AV graft.  His right IJ TDC was removed at the time of his graft placement.  No signs of infection or cellulitis.  -Pt does not have evidence of steal syndrome  -Right upper extremity edema POD 5 placement right upper arm AV graft.  He has a good bruit in the graft and his hand is well-perfused.  Hopefully, this is postoperative edema and will resolve with elevation.  Preoperative central venous gram revealed mild narrowing of right subclavian vein, however he had a right TDC catheter at the time of that exam.  I have instructed him to keep his arm above heart level is much as possible.  We will recheck in approximately 3 weeks for resolution.  --Barbie Banner, PA-C 08/18/2020 8:34 AM [facility] [phone]  Clinic MD:  Donzetta Matters

## 2020-08-19 DIAGNOSIS — Z992 Dependence on renal dialysis: Secondary | ICD-10-CM | POA: Diagnosis not present

## 2020-08-19 DIAGNOSIS — C61 Malignant neoplasm of prostate: Secondary | ICD-10-CM | POA: Diagnosis not present

## 2020-08-19 DIAGNOSIS — E8770 Fluid overload, unspecified: Secondary | ICD-10-CM | POA: Diagnosis not present

## 2020-08-19 DIAGNOSIS — N2581 Secondary hyperparathyroidism of renal origin: Secondary | ICD-10-CM | POA: Diagnosis not present

## 2020-08-19 DIAGNOSIS — N186 End stage renal disease: Secondary | ICD-10-CM | POA: Diagnosis not present

## 2020-08-19 DIAGNOSIS — N5201 Erectile dysfunction due to arterial insufficiency: Secondary | ICD-10-CM | POA: Diagnosis not present

## 2020-08-19 LAB — PROTIME-INR

## 2020-08-19 LAB — POCT INR: INR: 1.3 — AB (ref 2.0–3.0)

## 2020-08-21 DIAGNOSIS — Z992 Dependence on renal dialysis: Secondary | ICD-10-CM | POA: Diagnosis not present

## 2020-08-21 DIAGNOSIS — N186 End stage renal disease: Secondary | ICD-10-CM | POA: Diagnosis not present

## 2020-08-21 DIAGNOSIS — N2581 Secondary hyperparathyroidism of renal origin: Secondary | ICD-10-CM | POA: Diagnosis not present

## 2020-08-21 DIAGNOSIS — E8770 Fluid overload, unspecified: Secondary | ICD-10-CM | POA: Diagnosis not present

## 2020-08-23 ENCOUNTER — Ambulatory Visit (INDEPENDENT_AMBULATORY_CARE_PROVIDER_SITE_OTHER): Payer: Medicare HMO | Admitting: Internal Medicine

## 2020-08-23 DIAGNOSIS — I482 Chronic atrial fibrillation, unspecified: Secondary | ICD-10-CM | POA: Diagnosis not present

## 2020-08-23 DIAGNOSIS — D689 Coagulation defect, unspecified: Secondary | ICD-10-CM

## 2020-08-23 DIAGNOSIS — I4891 Unspecified atrial fibrillation: Secondary | ICD-10-CM

## 2020-08-23 DIAGNOSIS — Z7901 Long term (current) use of anticoagulants: Secondary | ICD-10-CM | POA: Diagnosis not present

## 2020-08-23 DIAGNOSIS — N2581 Secondary hyperparathyroidism of renal origin: Secondary | ICD-10-CM | POA: Diagnosis not present

## 2020-08-23 DIAGNOSIS — E8770 Fluid overload, unspecified: Secondary | ICD-10-CM | POA: Diagnosis not present

## 2020-08-23 DIAGNOSIS — Z992 Dependence on renal dialysis: Secondary | ICD-10-CM | POA: Diagnosis not present

## 2020-08-23 DIAGNOSIS — N186 End stage renal disease: Secondary | ICD-10-CM | POA: Diagnosis not present

## 2020-08-24 DIAGNOSIS — E8770 Fluid overload, unspecified: Secondary | ICD-10-CM | POA: Diagnosis not present

## 2020-08-24 DIAGNOSIS — N186 End stage renal disease: Secondary | ICD-10-CM | POA: Diagnosis not present

## 2020-08-24 DIAGNOSIS — N2581 Secondary hyperparathyroidism of renal origin: Secondary | ICD-10-CM | POA: Diagnosis not present

## 2020-08-24 DIAGNOSIS — Z992 Dependence on renal dialysis: Secondary | ICD-10-CM | POA: Diagnosis not present

## 2020-08-25 ENCOUNTER — Ambulatory Visit (INDEPENDENT_AMBULATORY_CARE_PROVIDER_SITE_OTHER): Payer: Medicare HMO | Admitting: Family

## 2020-08-25 ENCOUNTER — Encounter: Payer: Self-pay | Admitting: Family

## 2020-08-25 ENCOUNTER — Other Ambulatory Visit: Payer: Self-pay

## 2020-08-25 VITALS — BP 123/58 | HR 100 | Temp 97.3°F | Resp 16 | Wt 202.0 lb

## 2020-08-25 DIAGNOSIS — Z7901 Long term (current) use of anticoagulants: Secondary | ICD-10-CM | POA: Diagnosis not present

## 2020-08-25 DIAGNOSIS — N186 End stage renal disease: Secondary | ICD-10-CM

## 2020-08-25 DIAGNOSIS — I482 Chronic atrial fibrillation, unspecified: Secondary | ICD-10-CM | POA: Diagnosis not present

## 2020-08-25 DIAGNOSIS — Z23 Encounter for immunization: Secondary | ICD-10-CM

## 2020-08-25 DIAGNOSIS — Z992 Dependence on renal dialysis: Secondary | ICD-10-CM | POA: Diagnosis not present

## 2020-08-25 DIAGNOSIS — M5416 Radiculopathy, lumbar region: Secondary | ICD-10-CM

## 2020-08-25 NOTE — Progress Notes (Signed)
Subjective:    Patient ID: David [name], male    DOB: 21-Feb-1941, 79 y.o.   MRN: [id number]  HPI  Patient is a 79 yr old male who presents today for follow up. He is accompanied today by his [name].  CHF-denies any issues with his breathing.  Wife notes that ever since his last hospitalization he has been less ambulatory and is dependent on his walker. She states that he did PT but it really didn't help much.  The patient complains of low back pain which radiates into the left hip.   ESRD- continues 4 days a week.   Wt Readings from Last 3 Encounters:  08/25/20 202 lb (91.6 kg)  08/18/20 192 lb 3.2 oz (87.2 kg)  08/13/20 192 lb (87.1 kg)   Chronic anticoagulation due to AF- This is being monitored by the coumadin clinic.     Review of Systems See HPI  Past Medical History:  Diagnosis Date  . 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 Acute on chronic systolic and diastolic heart failure, NYHA class 4 ([place])   . Anemia, iron deficiency 11/15/2011  . Arthritis    "hands, right knee, feet" (02/21/2018)  . Atrial fibrillation ([place])   . Cardiac arrest ([place]) 02/17/2018  . CHF (congestive heart failure) ([place])   . Chronic combined systolic and diastolic heart failure, NYHA class 3 ([place]) 06/2010   felt to be secondary to hypertensive cardiomyopathy  . Chronic lower back pain   . CKD (chronic kidney disease) stage V requiring chronic dialysis ([place])   . COVID-19 12/2019  . ESRD on dialysis [facility])    started 02/2018 [place]  . GERD (gastroesophageal reflux disease)   . Glaucoma    "I had surgery and dont have it any more"  . Gout    daily RX (02/21/2018)  . Heart murmur    "mild" per pt  . Hepatitis    years ago  . HIV (human immunodeficiency virus infection) ([place])   . HIV infection ([place])   . Hyperlipidemia   . Hypertension   . Nonischemic cardiomyopathy ([place])   . Pneumonia 11/2017  . Prostate cancer ([place])    hx of prostate; s/p  radioactive seed implant 10/2009 Dr Janice Norrie  . Sinus bradycardia   . Sleep apnea    does not use a cpap  . Stroke [facility])    "mini stroke" years ago  . Wears glasses      Social History   Socioeconomic History  . Marital status: Married    Spouse name: Not on file  . Number of children: 2  . Years of education: 47  . Highest education level: Not on file  Occupational History  . 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 Smoking status: Former Smoker    Packs/day: 1.00    Years: 30.00    Pack years: 30.00    Types: Cigarettes    Quit date: 2006    Years since quitting: 15.7  . Smokeless tobacco: Never Used  Vaping Use  . Vaping Use: Never used  Substance and Sexual Activity  . Alcohol use: Yes    Alcohol/week: 1.0 standard drink    Types: 1 Shots of liquor per week    Comment: occasional  . Drug use: Never  . Sexual activity: Not Currently    Partners: Female    Comment: declined condoms 09/2019  Other Topics Concern  . Not on  file  Social History Narrative   Retired, 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   Social Determinants of Health   Financial Resource Strain: Low Risk   . Difficulty of Paying Living Expenses: Not hard at all  Food Insecurity: No Food Insecurity  . Worried About Charity fundraiser in the Last Year: Never true  . Ran Out of Food in the Last Year: Never true  Transportation Needs: No Transportation Needs  . Lack of Transportation (Medical): No  . Lack of Transportation (Non-Medical): No  Physical Activity:   . Days of Exercise per Week: Not on file  . Minutes of Exercise per Session: Not on file  Stress:   . Feeling of Stress : Not on file  Social Connections:   . Frequency of Communication with Friends and Family: Not on file  . Frequency of Social  Gatherings with Friends and Family: Not on file  . Attends Religious Services: Not on file  . Active Member of Clubs or Organizations: Not on file  . Attends Archivist Meetings: Not on file  . Marital Status: Not on file  Intimate Partner Violence:   . Fear of Current or Ex-Partner: Not on file  . Emotionally Abused: Not on file  . Physically Abused: Not on file  . Sexually Abused: Not on file    Past Surgical History:  Procedure Laterality Date  . A/V FISTULAGRAM Bilateral 07/30/2020   Procedure: CENTRAL VENOGRAM;  Surgeon: Angelia Mould, MD;  Location: [place] [facility];  Service: Cardiovascular;  Laterality: Bilateral;  bilateral upper venogram   . AV FISTULA PLACEMENT Right 10/13/2016   Procedure: ARTERIOVENOUS (AV) FISTULA CREATION;  Surgeon: [name], MD;  Location: [place];  Service: Vascular;  Laterality: Right;  . AV FISTULA PLACEMENT Left 02/25/2018   Procedure: INSERTION OF ARTERIOVENOUS (AV) GORE-TEX GRAFT LEFT UPPER ARM;  Surgeon: Angelia Mould, MD;  Location: [place];  Service: Vascular;  Laterality: Left;  . AV FISTULA PLACEMENT Right 08/07/2018   Procedure: Creation of right arm brachiocephalic Fistula;  Surgeon: Waynetta Sandy, MD;  Location: [place];  Service: Vascular;  Laterality: Right;  . AV FISTULA PLACEMENT Left 11/10/2019   Procedure: INSERTION OF ARTERIOVENOUS (AV) GORE-TEX GRAFT THIGH;  Surgeon: Elam Dutch, MD;  Location: [place];  Service: Vascular;  Laterality: Left;  . AV FISTULA PLACEMENT Right 12/02/2019   Procedure: INSERTION OF ARTERIOVENOUS (AV) GORE-TEX GRAFT RIGHT  THIGH;  Surgeon: Waynetta Sandy, MD;  Location: [place];  Service: Vascular;  Laterality: Right;  . AV FISTULA PLACEMENT Right 08/13/2020   Procedure: INSERTION OF RIGHT ARTERIOVENOUS (AV) GORE-TEX GRAFT ARM;  Surgeon: Angelia Mould, MD;  Location[place];  Service: Vascular;  Laterality: Right;  . BASCILIC VEIN TRANSPOSITION Left  06/24/2015   Procedure: BASILIC VEIN TRANSPOSITION;  Surgeon: Mal Misty, MD;  Location: [place];  Service: Vascular;  Laterality: Left;  . BIOPSY  03/14/2019   Procedure: BIOPSY;  Surgeon: Irene Shipper, MD;  Location: [place];  Service: Endoscopy;;  . CATARACT EXTRACTION, BILATERAL Bilateral   . COLONOSCOPY WITH PROPOFOL N/A 03/14/2019   Procedure: COLONOSCOPY WITH PROPOFOL;  Surgeon: Irene Shipper, MD;  Location: [place];  Service:  Endoscopy;  Laterality: N/A;  . ESOPHAGOGASTRODUODENOSCOPY (EGD) WITH PROPOFOL N/A 05/05/2014   Procedure: ESOPHAGOGASTRODUODENOSCOPY (EGD) WITH PROPOFOL;  Surgeon: Missy Sabins, MD;  Location: [facility];  Service: Endoscopy;  Laterality: N/A;  . EYE SURGERY Bilateral    cataract surgery   . GLAUCOMA SURGERY Bilateral   . INSERTION OF ARTERIOVENOUS (AV) ARTEGRAFT ARM  10/09/2018   Procedure: INSERTION OF ARTERIOVENOUS (AV) 49mm x 41cm ARTEGRAFT LEFT UPPER ARM;  Surgeon: Waynetta Sandy, MD;  Location: [place];  Service: Vascular;;  . INSERTION OF DIALYSIS CATHETER Right 07/14/2019   Procedure: Insertion Of Dialysis Catheter;  Surgeon: Elam Dutch, MD;  Location: [facility];  Service: Vascular;  Laterality: Right;  placed right Internal Jugular  . INSERTION OF DIALYSIS CATHETER Left 08/13/2020   Procedure: INSERTION OF DIALYSIS CATHETER;  Surgeon: Angelia Mould, MD;  Location: [place];  Service: Vascular;  Laterality: Left;  . INSERTION PROSTATE RADIATION SEED    . IR FLUORO GUIDE CV LINE RIGHT  02/18/2018  . IR REMOVAL TUN CV CATH W/O FL  12/06/2018  . IR THROMBECTOMY AV FISTULA W/THROMBOLYSIS/PTA INC/SHUNT/IMG RIGHT Right 01/26/2020  . IR THROMBECTOMY AV FISTULA W/THROMBOLYSIS/PTA INC/SHUNT/IMG RIGHT Right 02/24/2020  . IR THROMBECTOMY AV FISTULA W/THROMBOLYSIS/PTA INC/SHUNT/IMG RIGHT Right 04/07/2020  . IR US GUIDE VASC ACCESS RIGHT  02/18/2018  . IR US GUIDE VASC ACCESS RIGHT  01/26/2020  . IR US GUIDE VASC ACCESS RIGHT  04/07/2020  . LEFT HEART  CATH AND CORONARY ANGIOGRAPHY N/A 02/20/2018   Procedure: LEFT HEART CATH AND CORONARY ANGIOGRAPHY;  Surgeon: Jettie Booze, MD;  Location: [place] CV [facility];;; 50% ostial OM2, 25% mLAD.  Marland Kitchen LEFT HEART CATH AND CORONARY ANGIOGRAPHY  2004   mildly depressed LV systolic fx EF 47%,MLYYTK coronaries/abdominal aorta/renal arteries.  . LOWER EXTREMITY ANGIOGRAM Left 11/17/2019   Procedure: Lower Extremity Fistulogram;  Surgeon: Marty Heck, MD;  Location: [place];  Service: Vascular;  Laterality: Left;  . NM [place]  02/21/2010   No ischemia or infarction.  EF 27%.  Marland Kitchen RADIOACTIVE SEED IMPLANT  2010   prostate cancer  . REMOVAL OF A DIALYSIS CATHETER Right 08/13/2020   Procedure: REMOVAL OF A DIALYSIS CATHETER;  Surgeon: Angelia Mould, MD;  Location: [facility] OR;  Service: Vascular;  Laterality: Right;  . REVISION OF ARTERIOVENOUS GORETEX GRAFT Right 07/11/2019   Procedure: REVISION OF ARTERIOVENOUS GORETEX GRAFT RIGHT ARM WITH ARTEGRAFT;  Surgeon: Serafina Mitchell, MD;  Location: [place];  Service: Vascular;  Laterality: Right;  . SHUNTOGRAM Right 07/14/2019   Procedure: Shuntogram;  Surgeon: Elam Dutch, MD;  Location: [place];  Service: Vascular;  Laterality: Right;  . THROMBECTOMY AND REVISION OF ARTERIOVENTOUS (AV) GORETEX  GRAFT Right 07/14/2019   Procedure: THROMBECTOMY AND REVISION OF ARTERIOVENTOUS (AV) GORETEX  GRAFT;  Surgeon: Elam Dutch, MD;  Location: [place];  Service: Vascular;  Laterality: Right;  . THROMBECTOMY W/ EMBOLECTOMY Left 02/26/2018   Procedure: THROMBECTOMY ARTERIOVENOUS GRAFT;  Surgeon: Angelia Mould, MD;  Location: [place];  Service: Vascular;  Laterality: Left;  . THROMBECTOMY W/ EMBOLECTOMY Left 11/17/2019   Procedure: THROMBECTOMY ARTERIOVENOUS GRAFT LEFT THIGH;  Surgeon: Marty Heck, MD;  Location: [place];  Service: Vascular;  Laterality: Left;  . TRANSTHORACIC ECHOCARDIOGRAM  02/2012   [facility]) EF 45-50% with mild global  HK.  Mild LVH,LA mod. dilated,mild-mod. 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 AI.  Marland Kitchen TRANSTHORACIC ECHOCARDIOGRAM  9/'18; 3/'19  a) Severely reduced EF.  25 from 30%.  GR 1 DD with diffuse ecchymosis. Mild AS & AI.  Mild LA/RA dilation; b) (post PEA arest):   Moderately dilated LV with moderate concentric virtually.  EF 15 to 20%.  GR 1 DD.  Paradoxical septal motion. Mild AI.  Mild LA dilation.  Poorly visualized RV.  Marland Kitchen TRANSTHORACIC ECHOCARDIOGRAM  02/2019   EF 20-25%.  Unable to assess diastolic function (A. Fib).  No LV apical thrombus.  Mild AI.  Mildly reduced RV function, however poorly visualized.  Marland Kitchen ULTRASOUND GUIDANCE FOR VASCULAR ACCESS  02/20/2018   Procedure: Ultrasound Guidance For Vascular Access;  Surgeon: Jettie Booze, MD;  Location: [place] [facility];  Service: Cardiovascular;;  . UPPER EXTREMITY VENOGRAPHY N/A 07/08/2018   Procedure: UPPER EXTREMITY VENOGRAPHY;  Surgeon: Waynetta Sandy, MD;  Location: [place] [facility];  Service: Cardiovascular;  Laterality: N/A;  Bilateral    Family History  Problem Relation Age of Onset  . Hypertension Mother   . Thyroid disease Mother   . Cholelithiasis Daughter   . Cholelithiasis Son   . Hypertension Maternal Grandmother   . Diabetes Maternal Grandmother   . Heart attack Neg Hx   . Hyperlipidemia Neg Hx     Allergies  Allergen Reactions  . Dextromethorphan-Guaifenesin Other (See Comments)    Unknown reaction  . Tocotrienols Other (See Comments)    Unknown reaction  . Losartan Potassium Other (See Comments)    Causes constipation    Current Outpatient Medications on File Prior to Visit  Medication Sig Dispense Refill  . acetaminophen (TYLENOL) 500 MG tablet Take 1,000 mg by mouth every 6 (six) hours as needed for moderate pain.    Marland Kitchen allopurinol (ZYLOPRIM) 100 MG tablet TAKE 1 TABLET BY MOUTH 4 TIMES WEEKLY( MONDAY, TUESDAY,THURDSAY AND SATURDAY) AFTER DIALYSIS (Patient taking  differently: Take 100 mg by mouth See admin instructions. Mon., Tues., Thurs., and Saturday from 1800) 45 tablet 1  . amiodarone (PACERONE) 200 MG tablet TAKE 1 TABLET(200 MG) BY MOUTH DAILY (Patient taking differently: Take 200 mg by mouth daily. ) 90 tablet 3  . apixaban (ELIQUIS) 5 MG TABS tablet Take 1 tablet (5 mg total) by mouth 2 (two) times daily. 60 tablet 5  . [name] 50-200-25 MG TABS tablet TAKE 1 TABLET BY MOUTH DAILY 30 tablet 0  . midodrine (PROAMATINE) 10 MG tablet Take 1 tablet (10 mg total) by mouth as directed. Twice a day on the day of HD Tuesday, Thursday, Saturday (1 in AM and 1 at [place]). Take 1/2 tablet (5mg ) a day on non HD days (Patient taking differently: Take 10-20 mg by mouth See admin instructions. Take 10 mg in the morning at 5 AM. take 10 mg at 6AM of dialysis days and 10 mg half way through treatment on dialysis days (Mon, Tues, Thurs, and Sat)) 90 tablet 5  . montelukast (SINGULAIR) 10 MG tablet TAKE 1 TABLET(10 MG) BY MOUTH AT BEDTIME. FOLLOW UP (Patient taking differently: Take 10 mg by mouth at bedtime. ) 90 tablet 0  . multivitamin (RENA-VIT) TABS tablet Take 1 tablet by mouth every evening.    Marland Kitchen oxyCODONE (ROXICODONE) 5 MG immediate release tablet Take 1 tablet (5 mg total) by mouth every 4 (four) hours as needed. 15 tablet 0  . polyethylene glycol (MIRALAX / GLYCOLAX) 17 g packet Take 17 g by mouth daily as needed for mild constipation or moderate constipation.     . sucroferric oxyhydroxide (VELPHORO) 500  MG chewable tablet Chew 500 mg by mouth 3 (three) times daily with meals.     Current Facility-Administered Medications on File Prior to Visit  Medication Dose Route Frequency Provider Last Rate Last Admin  . 0.9 %  sodium chloride infusion  250 mL Intravenous PRN Angelia Mould, MD        BP (!) 123/58 (BP Location: Left Arm, Patient Position: Sitting, Cuff Size: Large)   Pulse 100   Temp (!) 97.3 F (36.3 C) (Temporal)   Resp 16   Wt 202 lb  (91.6 kg)   SpO2 (!) 71%   BMI 25.94 kg/m       Objective:   Physical Exam Constitutional:      General: He is not in acute distress.    Appearance: He is well-developed.  HENT:     Head: Normocephalic and atraumatic.  Cardiovascular:     Rate and Rhythm: Normal rate and regular rhythm.     Heart sounds: No murmur heard.   Pulmonary:     Effort: Pulmonary effort is normal. No respiratory distress.     Breath sounds: Normal breath sounds. No wheezing or rales.  Musculoskeletal:     Comments: Some swelling of the right upper extremity  Skin:    General: Skin is warm and dry.  Neurological:     Mental Status: He is alert and oriented to person, place, and time.  Psychiatric:        Behavior: Behavior normal.        Thought Content: Thought content normal.           Assessment & Plan:  ESRD- he continues HD 4 days a week.  Access has been a real challege for the dialysis team. Most recently he had a new AV graft on the RUE and HD catheter was removed from the right side of his chest and placed on the left side of his chest.   Chronic anticoagulation/AF- on coumadin- management per coumadin clinic.  Lumbar radiculopathy- He would not be a surgical candidate due to his multiple comorbidities.  ESI's would be difficult due to his anticoagulation. Offered home health PT, but pt and wife don't feel that he has time given his dialysis schedule and other appointments.  We opted to treat with prn tylenol and I gave him some exercises to do at home.  30 minutes spent on today's visit. Time was spent reviewing record, interviewing patient and counseling patient on his back pain options.   This visit occurred during the SARS-CoV-2 public health emergency.  Safety protocols were in place, including screening questions prior to the visit, additional usage of staff PPE, and extensive cleaning of exam room while observing appropriate contact time as indicated for disinfecting solutions.

## 2020-08-25 NOTE — Patient Instructions (Signed)

## 2020-08-26 DIAGNOSIS — E8770 Fluid overload, unspecified: Secondary | ICD-10-CM | POA: Diagnosis not present

## 2020-08-26 DIAGNOSIS — N186 End stage renal disease: Secondary | ICD-10-CM | POA: Diagnosis not present

## 2020-08-26 DIAGNOSIS — N2581 Secondary hyperparathyroidism of renal origin: Secondary | ICD-10-CM | POA: Diagnosis not present

## 2020-08-26 DIAGNOSIS — Z992 Dependence on renal dialysis: Secondary | ICD-10-CM | POA: Diagnosis not present

## 2020-08-28 DIAGNOSIS — E8770 Fluid overload, unspecified: Secondary | ICD-10-CM | POA: Diagnosis not present

## 2020-08-28 DIAGNOSIS — Z992 Dependence on renal dialysis: Secondary | ICD-10-CM | POA: Diagnosis not present

## 2020-08-28 DIAGNOSIS — N2581 Secondary hyperparathyroidism of renal origin: Secondary | ICD-10-CM | POA: Diagnosis not present

## 2020-08-28 DIAGNOSIS — N186 End stage renal disease: Secondary | ICD-10-CM | POA: Diagnosis not present

## 2020-08-30 DIAGNOSIS — E8770 Fluid overload, unspecified: Secondary | ICD-10-CM | POA: Diagnosis not present

## 2020-08-30 DIAGNOSIS — Z992 Dependence on renal dialysis: Secondary | ICD-10-CM | POA: Diagnosis not present

## 2020-08-30 DIAGNOSIS — N186 End stage renal disease: Secondary | ICD-10-CM | POA: Diagnosis not present

## 2020-08-30 DIAGNOSIS — N2581 Secondary hyperparathyroidism of renal origin: Secondary | ICD-10-CM | POA: Diagnosis not present

## 2020-08-31 DIAGNOSIS — N186 End stage renal disease: Secondary | ICD-10-CM | POA: Diagnosis not present

## 2020-08-31 DIAGNOSIS — Z992 Dependence on renal dialysis: Secondary | ICD-10-CM | POA: Diagnosis not present

## 2020-08-31 DIAGNOSIS — E8770 Fluid overload, unspecified: Secondary | ICD-10-CM | POA: Diagnosis not present

## 2020-08-31 DIAGNOSIS — N2581 Secondary hyperparathyroidism of renal origin: Secondary | ICD-10-CM | POA: Diagnosis not present

## 2020-09-02 DIAGNOSIS — Z992 Dependence on renal dialysis: Secondary | ICD-10-CM | POA: Diagnosis not present

## 2020-09-02 DIAGNOSIS — E8770 Fluid overload, unspecified: Secondary | ICD-10-CM | POA: Diagnosis not present

## 2020-09-02 DIAGNOSIS — N2581 Secondary hyperparathyroidism of renal origin: Secondary | ICD-10-CM | POA: Diagnosis not present

## 2020-09-02 DIAGNOSIS — N186 End stage renal disease: Secondary | ICD-10-CM | POA: Diagnosis not present

## 2020-09-02 DIAGNOSIS — B20 Human immunodeficiency virus [HIV] disease: Secondary | ICD-10-CM | POA: Diagnosis not present

## 2020-09-04 DIAGNOSIS — Z992 Dependence on renal dialysis: Secondary | ICD-10-CM | POA: Diagnosis not present

## 2020-09-04 DIAGNOSIS — N186 End stage renal disease: Secondary | ICD-10-CM | POA: Diagnosis not present

## 2020-09-04 DIAGNOSIS — E8770 Fluid overload, unspecified: Secondary | ICD-10-CM | POA: Diagnosis not present

## 2020-09-04 DIAGNOSIS — N2581 Secondary hyperparathyroidism of renal origin: Secondary | ICD-10-CM | POA: Diagnosis not present

## 2020-09-06 DIAGNOSIS — E8770 Fluid overload, unspecified: Secondary | ICD-10-CM | POA: Diagnosis not present

## 2020-09-06 DIAGNOSIS — Z992 Dependence on renal dialysis: Secondary | ICD-10-CM | POA: Diagnosis not present

## 2020-09-06 DIAGNOSIS — N2581 Secondary hyperparathyroidism of renal origin: Secondary | ICD-10-CM | POA: Diagnosis not present

## 2020-09-06 DIAGNOSIS — N186 End stage renal disease: Secondary | ICD-10-CM | POA: Diagnosis not present

## 2020-09-07 DIAGNOSIS — Z992 Dependence on renal dialysis: Secondary | ICD-10-CM | POA: Diagnosis not present

## 2020-09-07 DIAGNOSIS — E8770 Fluid overload, unspecified: Secondary | ICD-10-CM | POA: Diagnosis not present

## 2020-09-07 DIAGNOSIS — N2581 Secondary hyperparathyroidism of renal origin: Secondary | ICD-10-CM | POA: Diagnosis not present

## 2020-09-07 DIAGNOSIS — N186 End stage renal disease: Secondary | ICD-10-CM | POA: Diagnosis not present

## 2020-09-08 ENCOUNTER — Ambulatory Visit (INDEPENDENT_AMBULATORY_CARE_PROVIDER_SITE_OTHER): Payer: Self-pay | Admitting: Physician Assistant

## 2020-09-08 ENCOUNTER — Other Ambulatory Visit: Payer: Self-pay

## 2020-09-08 VITALS — BP 130/66 | HR 74 | Temp 98.3°F | Resp 20 | Ht 74.0 in | Wt 195.0 lb

## 2020-09-08 DIAGNOSIS — N186 End stage renal disease: Secondary | ICD-10-CM

## 2020-09-08 DIAGNOSIS — Z992 Dependence on renal dialysis: Secondary | ICD-10-CM

## 2020-09-08 NOTE — Progress Notes (Signed)
POST OPERATIVE OFFICE NOTE    CC:  F/u for surgery  HPI:  This is a 79 y.o. male who is s/p RUA AVG with TDC placement on 08/13/2020 by Dr. Scot Dock.   He was seen on 08/18/2020 for right arm swelling.  He did not have evidence of steal syndrome.  His graft was patent and it was felt that the swelling was a result of postoperative edema and recommended elevation of the arm.    He returns today for re-check.  He states that the swelling in his arm has improved but still a little swollen.  He denies any pain in his right hand.  He states he does get some tingling in his right hand at night but this gets better during the day when he can move his fingers around.  He states the does have a little bit of numbness on the back of his forearm.      The pt is on dialysis M/T/T/S at [facility] location.   Allergies  Allergen Reactions  . Dextromethorphan-Guaifenesin Other (See Comments)    Unknown reaction  . Tocotrienols Other (See Comments)    Unknown reaction  . Losartan Potassium Other (See Comments)    Causes constipation    Current Outpatient Medications  Medication Sig Dispense Refill  . acetaminophen (TYLENOL) 500 MG tablet Take 1,000 mg by mouth every 6 (six) hours as needed for moderate pain.    Marland Kitchen allopurinol (ZYLOPRIM) 100 MG tablet TAKE 1 TABLET BY MOUTH 4 TIMES WEEKLY( MONDAY, TUESDAY,THURDSAY AND SATURDAY) AFTER DIALYSIS (Patient taking differently: Take 100 mg by mouth See admin instructions. Mon., Tues., Thurs., and Saturday from 1800) 45 tablet 1  . amiodarone (PACERONE) 200 MG tablet TAKE 1 TABLET(200 MG) BY MOUTH DAILY (Patient taking differently: Take 200 mg by mouth daily. ) 90 tablet 3  . apixaban (ELIQUIS) 5 MG TABS tablet Take 1 tablet (5 mg total) by mouth 2 (two) times daily. 60 tablet 5  . [name] 50-200-25 MG TABS tablet TAKE 1 TABLET BY MOUTH DAILY 30 tablet 0  . DIPHENHYDRAMINE HCL PO Take by mouth.    . heparin 1000 unit/mL SOLN injection Heparin Sodium  (Porcine) 1,000 Units/mL Catheter Lock Arterial    . lidocaine-prilocaine (EMLA) cream Apply topically.    . midodrine (PROAMATINE) 10 MG tablet Take 1 tablet (10 mg total) by mouth as directed. Twice a day on the day of HD Tuesday, Thursday, Saturday (1 in AM and 1 at [place]). Take 1/2 tablet (5mg ) a day on non HD days (Patient taking differently: Take 10-20 mg by mouth See admin instructions. Take 10 mg in the morning at 5 AM. take 10 mg at 6AM of dialysis days and 10 mg half way through treatment on dialysis days (Mon, Tues, Thurs, and Sat)) 90 tablet 5  . montelukast (SINGULAIR) 10 MG tablet TAKE 1 TABLET(10 MG) BY MOUTH AT BEDTIME. FOLLOW UP (Patient taking differently: Take 10 mg by mouth at bedtime. ) 90 tablet 0  . multivitamin (RENA-VIT) TABS tablet Take 1 tablet by mouth every evening.    Marland Kitchen ofloxacin (OCUFLOX) 0.3 % ophthalmic solution Place 1 drop into the right eye 4 (four) times daily.    Marland Kitchen oxyCODONE (ROXICODONE) 5 MG immediate release tablet Take 1 tablet (5 mg total) by mouth every 4 (four) hours as needed. 15 tablet 0  . polyethylene glycol (MIRALAX / GLYCOLAX) 17 g packet Take 17 g by mouth daily as needed for mild constipation or moderate constipation.     Marland Kitchen  sucroferric oxyhydroxide (VELPHORO) 500 MG chewable tablet Chew 500 mg by mouth 3 (three) times daily with meals.     Current Facility-Administered Medications  Medication Dose Route Frequency Provider Last Rate Last Admin  . 0.9 %  sodium chloride infusion  250 mL Intravenous PRN Angelia Mould, MD         ROS:  See HPI  Physical Exam:  Today's Vitals   09/08/20 1446  BP: 130/66  Pulse: 74  Resp: 20  Temp: 98.3 F (36.8 C)  TempSrc: Temporal  SpO2: 99%  Weight: 195 lb (88.5 kg)  Height: 6\' 2"  (1.88 m)   Body mass index is 25.04 kg/m.   Incision:  Both incisions are healed with a spitting stitch in both Extremities:   There is a palpable right radial pulse.   Motor and sensory are in tact.   Hand  grips are equal bilaterally There is a thrill present.    Dialysis Duplex No study today  Assessment/Plan:  This is a 79 y.o. male who is s/p: RUA AVG with TDC placement on 08/13/2020 by Dr. Scot Dock.    -the pt has mild tingling in his hand at night and this improves with movement during the day.  I discussed with the pt about steal syndrome and if his symptoms worsen or he develops any motor deficits or worsening symptoms, he should contact us as soon as possible.   -the graft can be used on 09/15/2020-will wait one more week to give the swelling a little more time to resolve. -the pt's tunneled dialysis catheter can be scheduled to be removed once the graft has been used several times to the satisfaction of the dialysis center.  Pt is on Eliquis and would need to be off of this for removal.    -the spitting stitch in both incisions was removed and pt tolerated weill -the pt will follow up as needed.    Leontine Locket, [facility] [phone]  Clinic MD:  Scot Dock

## 2020-09-09 DIAGNOSIS — Z992 Dependence on renal dialysis: Secondary | ICD-10-CM | POA: Diagnosis not present

## 2020-09-09 DIAGNOSIS — N2581 Secondary hyperparathyroidism of renal origin: Secondary | ICD-10-CM | POA: Diagnosis not present

## 2020-09-09 DIAGNOSIS — E8770 Fluid overload, unspecified: Secondary | ICD-10-CM | POA: Diagnosis not present

## 2020-09-09 DIAGNOSIS — N186 End stage renal disease: Secondary | ICD-10-CM | POA: Diagnosis not present

## 2020-09-11 DIAGNOSIS — N2581 Secondary hyperparathyroidism of renal origin: Secondary | ICD-10-CM | POA: Diagnosis not present

## 2020-09-11 DIAGNOSIS — N186 End stage renal disease: Secondary | ICD-10-CM | POA: Diagnosis not present

## 2020-09-11 DIAGNOSIS — E8770 Fluid overload, unspecified: Secondary | ICD-10-CM | POA: Diagnosis not present

## 2020-09-11 DIAGNOSIS — Z992 Dependence on renal dialysis: Secondary | ICD-10-CM | POA: Diagnosis not present

## 2020-09-13 DIAGNOSIS — N186 End stage renal disease: Secondary | ICD-10-CM | POA: Diagnosis not present

## 2020-09-13 DIAGNOSIS — E8770 Fluid overload, unspecified: Secondary | ICD-10-CM | POA: Diagnosis not present

## 2020-09-13 DIAGNOSIS — Z992 Dependence on renal dialysis: Secondary | ICD-10-CM | POA: Diagnosis not present

## 2020-09-13 DIAGNOSIS — N2581 Secondary hyperparathyroidism of renal origin: Secondary | ICD-10-CM | POA: Diagnosis not present

## 2020-09-14 ENCOUNTER — Other Ambulatory Visit: Payer: Self-pay | Admitting: Infectious Disease

## 2020-09-14 DIAGNOSIS — E8770 Fluid overload, unspecified: Secondary | ICD-10-CM | POA: Diagnosis not present

## 2020-09-14 DIAGNOSIS — N186 End stage renal disease: Secondary | ICD-10-CM | POA: Diagnosis not present

## 2020-09-14 DIAGNOSIS — Z992 Dependence on renal dialysis: Secondary | ICD-10-CM | POA: Diagnosis not present

## 2020-09-14 DIAGNOSIS — N2581 Secondary hyperparathyroidism of renal origin: Secondary | ICD-10-CM | POA: Diagnosis not present

## 2020-09-16 DIAGNOSIS — N2581 Secondary hyperparathyroidism of renal origin: Secondary | ICD-10-CM | POA: Diagnosis not present

## 2020-09-16 DIAGNOSIS — N186 End stage renal disease: Secondary | ICD-10-CM | POA: Diagnosis not present

## 2020-09-16 DIAGNOSIS — E8770 Fluid overload, unspecified: Secondary | ICD-10-CM | POA: Diagnosis not present

## 2020-09-16 DIAGNOSIS — Z992 Dependence on renal dialysis: Secondary | ICD-10-CM | POA: Diagnosis not present

## 2020-09-18 DIAGNOSIS — Z992 Dependence on renal dialysis: Secondary | ICD-10-CM | POA: Diagnosis not present

## 2020-09-18 DIAGNOSIS — N2581 Secondary hyperparathyroidism of renal origin: Secondary | ICD-10-CM | POA: Diagnosis not present

## 2020-09-18 DIAGNOSIS — N186 End stage renal disease: Secondary | ICD-10-CM | POA: Diagnosis not present

## 2020-09-18 DIAGNOSIS — E8770 Fluid overload, unspecified: Secondary | ICD-10-CM | POA: Diagnosis not present

## 2020-09-20 DIAGNOSIS — Z992 Dependence on renal dialysis: Secondary | ICD-10-CM | POA: Diagnosis not present

## 2020-09-20 DIAGNOSIS — N2581 Secondary hyperparathyroidism of renal origin: Secondary | ICD-10-CM | POA: Diagnosis not present

## 2020-09-20 DIAGNOSIS — N186 End stage renal disease: Secondary | ICD-10-CM | POA: Diagnosis not present

## 2020-09-20 DIAGNOSIS — E8770 Fluid overload, unspecified: Secondary | ICD-10-CM | POA: Diagnosis not present

## 2020-09-21 DIAGNOSIS — E8770 Fluid overload, unspecified: Secondary | ICD-10-CM | POA: Diagnosis not present

## 2020-09-21 DIAGNOSIS — Z992 Dependence on renal dialysis: Secondary | ICD-10-CM | POA: Diagnosis not present

## 2020-09-21 DIAGNOSIS — N186 End stage renal disease: Secondary | ICD-10-CM | POA: Diagnosis not present

## 2020-09-21 DIAGNOSIS — N2581 Secondary hyperparathyroidism of renal origin: Secondary | ICD-10-CM | POA: Diagnosis not present

## 2020-09-23 DIAGNOSIS — N186 End stage renal disease: Secondary | ICD-10-CM | POA: Diagnosis not present

## 2020-09-23 DIAGNOSIS — Z992 Dependence on renal dialysis: Secondary | ICD-10-CM | POA: Diagnosis not present

## 2020-09-23 DIAGNOSIS — E8770 Fluid overload, unspecified: Secondary | ICD-10-CM | POA: Diagnosis not present

## 2020-09-23 DIAGNOSIS — N2581 Secondary hyperparathyroidism of renal origin: Secondary | ICD-10-CM | POA: Diagnosis not present

## 2020-09-25 DIAGNOSIS — E8770 Fluid overload, unspecified: Secondary | ICD-10-CM | POA: Diagnosis not present

## 2020-09-25 DIAGNOSIS — N2581 Secondary hyperparathyroidism of renal origin: Secondary | ICD-10-CM | POA: Diagnosis not present

## 2020-09-25 DIAGNOSIS — Z992 Dependence on renal dialysis: Secondary | ICD-10-CM | POA: Diagnosis not present

## 2020-09-25 DIAGNOSIS — N186 End stage renal disease: Secondary | ICD-10-CM | POA: Diagnosis not present

## 2020-09-27 DIAGNOSIS — N186 End stage renal disease: Secondary | ICD-10-CM | POA: Diagnosis not present

## 2020-09-27 DIAGNOSIS — N2581 Secondary hyperparathyroidism of renal origin: Secondary | ICD-10-CM | POA: Diagnosis not present

## 2020-09-27 DIAGNOSIS — E8770 Fluid overload, unspecified: Secondary | ICD-10-CM | POA: Diagnosis not present

## 2020-09-27 DIAGNOSIS — Z992 Dependence on renal dialysis: Secondary | ICD-10-CM | POA: Diagnosis not present

## 2020-09-28 DIAGNOSIS — N186 End stage renal disease: Secondary | ICD-10-CM | POA: Diagnosis not present

## 2020-09-28 DIAGNOSIS — E8770 Fluid overload, unspecified: Secondary | ICD-10-CM | POA: Diagnosis not present

## 2020-09-28 DIAGNOSIS — Z992 Dependence on renal dialysis: Secondary | ICD-10-CM | POA: Diagnosis not present

## 2020-09-28 DIAGNOSIS — N2581 Secondary hyperparathyroidism of renal origin: Secondary | ICD-10-CM | POA: Diagnosis not present

## 2020-09-29 DIAGNOSIS — T829XXA Unspecified complication of cardiac and vascular prosthetic device, implant and graft, initial encounter: Secondary | ICD-10-CM | POA: Insufficient documentation

## 2020-09-30 DIAGNOSIS — N186 End stage renal disease: Secondary | ICD-10-CM | POA: Diagnosis not present

## 2020-09-30 DIAGNOSIS — Z992 Dependence on renal dialysis: Secondary | ICD-10-CM | POA: Diagnosis not present

## 2020-09-30 DIAGNOSIS — E8770 Fluid overload, unspecified: Secondary | ICD-10-CM | POA: Diagnosis not present

## 2020-09-30 DIAGNOSIS — N2581 Secondary hyperparathyroidism of renal origin: Secondary | ICD-10-CM | POA: Diagnosis not present

## 2020-10-02 DIAGNOSIS — N186 End stage renal disease: Secondary | ICD-10-CM | POA: Diagnosis not present

## 2020-10-02 DIAGNOSIS — N2581 Secondary hyperparathyroidism of renal origin: Secondary | ICD-10-CM | POA: Diagnosis not present

## 2020-10-02 DIAGNOSIS — Z992 Dependence on renal dialysis: Secondary | ICD-10-CM | POA: Diagnosis not present

## 2020-10-02 DIAGNOSIS — E8770 Fluid overload, unspecified: Secondary | ICD-10-CM | POA: Diagnosis not present

## 2020-10-03 DIAGNOSIS — N186 End stage renal disease: Secondary | ICD-10-CM | POA: Diagnosis not present

## 2020-10-03 DIAGNOSIS — B20 Human immunodeficiency virus [HIV] disease: Secondary | ICD-10-CM | POA: Diagnosis not present

## 2020-10-03 DIAGNOSIS — Z992 Dependence on renal dialysis: Secondary | ICD-10-CM | POA: Diagnosis not present

## 2020-10-04 DIAGNOSIS — Z992 Dependence on renal dialysis: Secondary | ICD-10-CM | POA: Diagnosis not present

## 2020-10-04 DIAGNOSIS — N186 End stage renal disease: Secondary | ICD-10-CM | POA: Diagnosis not present

## 2020-10-04 DIAGNOSIS — N2581 Secondary hyperparathyroidism of renal origin: Secondary | ICD-10-CM | POA: Diagnosis not present

## 2020-10-05 DIAGNOSIS — Z992 Dependence on renal dialysis: Secondary | ICD-10-CM | POA: Diagnosis not present

## 2020-10-05 DIAGNOSIS — N186 End stage renal disease: Secondary | ICD-10-CM | POA: Diagnosis not present

## 2020-10-05 DIAGNOSIS — N2581 Secondary hyperparathyroidism of renal origin: Secondary | ICD-10-CM | POA: Diagnosis not present

## 2020-10-06 DIAGNOSIS — Z992 Dependence on renal dialysis: Secondary | ICD-10-CM | POA: Diagnosis not present

## 2020-10-06 DIAGNOSIS — N186 End stage renal disease: Secondary | ICD-10-CM | POA: Diagnosis not present

## 2020-10-06 DIAGNOSIS — T82868A Thrombosis of vascular prosthetic devices, implants and grafts, initial encounter: Secondary | ICD-10-CM | POA: Diagnosis not present

## 2020-10-06 DIAGNOSIS — I871 Compression of vein: Secondary | ICD-10-CM | POA: Diagnosis not present

## 2020-10-07 DIAGNOSIS — N186 End stage renal disease: Secondary | ICD-10-CM | POA: Diagnosis not present

## 2020-10-07 DIAGNOSIS — N2581 Secondary hyperparathyroidism of renal origin: Secondary | ICD-10-CM | POA: Diagnosis not present

## 2020-10-07 DIAGNOSIS — Z992 Dependence on renal dialysis: Secondary | ICD-10-CM | POA: Diagnosis not present

## 2020-10-09 DIAGNOSIS — N2581 Secondary hyperparathyroidism of renal origin: Secondary | ICD-10-CM | POA: Diagnosis not present

## 2020-10-09 DIAGNOSIS — Z992 Dependence on renal dialysis: Secondary | ICD-10-CM | POA: Diagnosis not present

## 2020-10-09 DIAGNOSIS — N186 End stage renal disease: Secondary | ICD-10-CM | POA: Diagnosis not present

## 2020-10-11 DIAGNOSIS — Z992 Dependence on renal dialysis: Secondary | ICD-10-CM | POA: Diagnosis not present

## 2020-10-11 DIAGNOSIS — N2581 Secondary hyperparathyroidism of renal origin: Secondary | ICD-10-CM | POA: Diagnosis not present

## 2020-10-11 DIAGNOSIS — N186 End stage renal disease: Secondary | ICD-10-CM | POA: Diagnosis not present

## 2020-10-12 ENCOUNTER — Encounter (HOSPITAL_COMMUNITY): Payer: Medicare HMO

## 2020-10-12 ENCOUNTER — Ambulatory Visit: Payer: Medicare HMO | Admitting: Vascular Surgery

## 2020-10-12 DIAGNOSIS — N186 End stage renal disease: Secondary | ICD-10-CM | POA: Diagnosis not present

## 2020-10-12 DIAGNOSIS — N2581 Secondary hyperparathyroidism of renal origin: Secondary | ICD-10-CM | POA: Diagnosis not present

## 2020-10-12 DIAGNOSIS — Z992 Dependence on renal dialysis: Secondary | ICD-10-CM | POA: Diagnosis not present

## 2020-10-14 DIAGNOSIS — N186 End stage renal disease: Secondary | ICD-10-CM | POA: Diagnosis not present

## 2020-10-14 DIAGNOSIS — N2581 Secondary hyperparathyroidism of renal origin: Secondary | ICD-10-CM | POA: Diagnosis not present

## 2020-10-14 DIAGNOSIS — Z992 Dependence on renal dialysis: Secondary | ICD-10-CM | POA: Diagnosis not present

## 2020-10-16 DIAGNOSIS — N186 End stage renal disease: Secondary | ICD-10-CM | POA: Diagnosis not present

## 2020-10-16 DIAGNOSIS — Z992 Dependence on renal dialysis: Secondary | ICD-10-CM | POA: Diagnosis not present

## 2020-10-16 DIAGNOSIS — N2581 Secondary hyperparathyroidism of renal origin: Secondary | ICD-10-CM | POA: Diagnosis not present

## 2020-10-18 DIAGNOSIS — Z992 Dependence on renal dialysis: Secondary | ICD-10-CM | POA: Diagnosis not present

## 2020-10-18 DIAGNOSIS — N2581 Secondary hyperparathyroidism of renal origin: Secondary | ICD-10-CM | POA: Diagnosis not present

## 2020-10-18 DIAGNOSIS — N186 End stage renal disease: Secondary | ICD-10-CM | POA: Diagnosis not present

## 2020-10-19 ENCOUNTER — Other Ambulatory Visit: Payer: Self-pay | Admitting: Cardiovascular Disease

## 2020-10-19 ENCOUNTER — Telehealth: Payer: Self-pay | Admitting: Cardiovascular Disease

## 2020-10-19 DIAGNOSIS — N186 End stage renal disease: Secondary | ICD-10-CM | POA: Diagnosis not present

## 2020-10-19 DIAGNOSIS — N2581 Secondary hyperparathyroidism of renal origin: Secondary | ICD-10-CM | POA: Diagnosis not present

## 2020-10-19 DIAGNOSIS — Z992 Dependence on renal dialysis: Secondary | ICD-10-CM | POA: Diagnosis not present

## 2020-10-19 NOTE — Telephone Encounter (Signed)
*  STAT* If patient is at the pharmacy, call can be transferred to refill team.   1. Which medications need to be refilled? (please list name of each medication and dose if known) amiodarone (PACERONE) 200 MG tablet  2. Which pharmacy/location (including street and city if local pharmacy) is medication to be sent to? [facility] [place], [place]  3. Do they need a 30 day or 90 day supply? [place]

## 2020-10-20 MED ORDER — ALLOPURINOL 100 MG PO TABS: ORAL_TABLET | ORAL | 5 refills | Status: AC

## 2020-10-20 MED ORDER — AMIODARONE HCL 200 MG PO TABS: ORAL_TABLET | ORAL | 3 refills | Status: AC

## 2020-10-21 ENCOUNTER — Telehealth: Payer: Self-pay

## 2020-10-21 DIAGNOSIS — N2581 Secondary hyperparathyroidism of renal origin: Secondary | ICD-10-CM | POA: Diagnosis not present

## 2020-10-21 DIAGNOSIS — N186 End stage renal disease: Secondary | ICD-10-CM | POA: Diagnosis not present

## 2020-10-21 DIAGNOSIS — Z992 Dependence on renal dialysis: Secondary | ICD-10-CM | POA: Diagnosis not present

## 2020-10-21 NOTE — Telephone Encounter (Signed)
Left message to call back. Prior to eye surgery in the past Eliquis was held one day- will recommend that when he calls back.  Kerin Ransom PA-C 10/21/2020 3:16 PM

## 2020-10-21 NOTE — Telephone Encounter (Signed)
   Primary Cardiologist: [name], MD  Chart reviewed and patient contacted by phone today as part of pre-operative protocol coverage. Given past medical history and time since last visit, based on ACC/AHA guidelines, [facility] would be at acceptable risk for the planned procedure without further cardiovascular testing.   Ok to hold Eliquis one day pre op. I spoke with the patient today and relayed these instructions.   The patient was advised that if he develops new symptoms prior to surgery to contact our office to arrange for a follow-up visit, and he verbalized understanding.  I will route this recommendation to the requesting party via Epic fax function and remove from pre-op pool.  Please call with questions.  Kerin Ransom, PA-C 10/21/2020, 3:46 PM

## 2020-10-21 NOTE — Telephone Encounter (Signed)
   [facility] Pre-operative Risk Assessment    Request for surgical clearance:  1. What type of surgery is being performed? XEN OD ELIQUIS   2. When is this surgery scheduled? TBD   3. What type of clearance is required (medical clearance vs. Pharmacy clearance to hold med vs. Both)? BOTH  4. Are there any medications that need to be held prior to surgery and how long? ELIQUIS   5. Practice name and name of physician performing surgery? [facility]     6. What is the office phone number? [phone]   7.   What is the office fax number? [phone]  8.   Anesthesia type (None, local, MAC, general) ? MAC

## 2020-10-23 DIAGNOSIS — Z992 Dependence on renal dialysis: Secondary | ICD-10-CM | POA: Diagnosis not present

## 2020-10-23 DIAGNOSIS — N186 End stage renal disease: Secondary | ICD-10-CM | POA: Diagnosis not present

## 2020-10-23 DIAGNOSIS — N2581 Secondary hyperparathyroidism of renal origin: Secondary | ICD-10-CM | POA: Diagnosis not present

## 2020-10-25 DIAGNOSIS — N186 End stage renal disease: Secondary | ICD-10-CM | POA: Diagnosis not present

## 2020-10-25 DIAGNOSIS — N2581 Secondary hyperparathyroidism of renal origin: Secondary | ICD-10-CM | POA: Diagnosis not present

## 2020-10-25 DIAGNOSIS — Z992 Dependence on renal dialysis: Secondary | ICD-10-CM | POA: Diagnosis not present

## 2020-10-27 DIAGNOSIS — N186 End stage renal disease: Secondary | ICD-10-CM | POA: Diagnosis not present

## 2020-10-27 DIAGNOSIS — Z992 Dependence on renal dialysis: Secondary | ICD-10-CM | POA: Diagnosis not present

## 2020-10-27 DIAGNOSIS — N2581 Secondary hyperparathyroidism of renal origin: Secondary | ICD-10-CM | POA: Diagnosis not present

## 2020-10-30 DIAGNOSIS — N2581 Secondary hyperparathyroidism of renal origin: Secondary | ICD-10-CM | POA: Diagnosis not present

## 2020-10-30 DIAGNOSIS — Z992 Dependence on renal dialysis: Secondary | ICD-10-CM | POA: Diagnosis not present

## 2020-10-30 DIAGNOSIS — N186 End stage renal disease: Secondary | ICD-10-CM | POA: Diagnosis not present

## 2020-11-01 DIAGNOSIS — N2581 Secondary hyperparathyroidism of renal origin: Secondary | ICD-10-CM | POA: Diagnosis not present

## 2020-11-01 DIAGNOSIS — Z992 Dependence on renal dialysis: Secondary | ICD-10-CM | POA: Diagnosis not present

## 2020-11-01 DIAGNOSIS — N186 End stage renal disease: Secondary | ICD-10-CM | POA: Diagnosis not present

## 2020-11-02 DIAGNOSIS — N2581 Secondary hyperparathyroidism of renal origin: Secondary | ICD-10-CM | POA: Diagnosis not present

## 2020-11-02 DIAGNOSIS — B20 Human immunodeficiency virus [HIV] disease: Secondary | ICD-10-CM | POA: Diagnosis not present

## 2020-11-02 DIAGNOSIS — N186 End stage renal disease: Secondary | ICD-10-CM | POA: Diagnosis not present

## 2020-11-02 DIAGNOSIS — Z992 Dependence on renal dialysis: Secondary | ICD-10-CM | POA: Diagnosis not present

## 2020-11-03 ENCOUNTER — Other Ambulatory Visit: Payer: Self-pay | Admitting: Family

## 2020-11-04 DIAGNOSIS — N186 End stage renal disease: Secondary | ICD-10-CM | POA: Diagnosis not present

## 2020-11-04 DIAGNOSIS — Z992 Dependence on renal dialysis: Secondary | ICD-10-CM | POA: Diagnosis not present

## 2020-11-04 DIAGNOSIS — N2581 Secondary hyperparathyroidism of renal origin: Secondary | ICD-10-CM | POA: Diagnosis not present

## 2020-11-06 DIAGNOSIS — N186 End stage renal disease: Secondary | ICD-10-CM | POA: Diagnosis not present

## 2020-11-06 DIAGNOSIS — Z992 Dependence on renal dialysis: Secondary | ICD-10-CM | POA: Diagnosis not present

## 2020-11-06 DIAGNOSIS — N2581 Secondary hyperparathyroidism of renal origin: Secondary | ICD-10-CM | POA: Diagnosis not present

## 2020-11-08 DIAGNOSIS — Z992 Dependence on renal dialysis: Secondary | ICD-10-CM | POA: Diagnosis not present

## 2020-11-08 DIAGNOSIS — N186 End stage renal disease: Secondary | ICD-10-CM | POA: Diagnosis not present

## 2020-11-08 DIAGNOSIS — N2581 Secondary hyperparathyroidism of renal origin: Secondary | ICD-10-CM | POA: Diagnosis not present

## 2020-11-09 DIAGNOSIS — N186 End stage renal disease: Secondary | ICD-10-CM | POA: Diagnosis not present

## 2020-11-09 DIAGNOSIS — Z992 Dependence on renal dialysis: Secondary | ICD-10-CM | POA: Diagnosis not present

## 2020-11-09 DIAGNOSIS — N2581 Secondary hyperparathyroidism of renal origin: Secondary | ICD-10-CM | POA: Diagnosis not present

## 2020-11-11 ENCOUNTER — Other Ambulatory Visit (HOSPITAL_COMMUNITY): Payer: Self-pay | Admitting: Nephrology

## 2020-11-11 DIAGNOSIS — Z992 Dependence on renal dialysis: Secondary | ICD-10-CM | POA: Diagnosis not present

## 2020-11-11 DIAGNOSIS — N186 End stage renal disease: Secondary | ICD-10-CM

## 2020-11-11 DIAGNOSIS — N2581 Secondary hyperparathyroidism of renal origin: Secondary | ICD-10-CM | POA: Diagnosis not present

## 2020-11-12 ENCOUNTER — Other Ambulatory Visit: Payer: Self-pay

## 2020-11-12 ENCOUNTER — Other Ambulatory Visit (HOSPITAL_COMMUNITY): Payer: Self-pay | Admitting: Nephrology

## 2020-11-12 ENCOUNTER — Ambulatory Visit (HOSPITAL_COMMUNITY)
Admission: RE | Admit: 2020-11-12 | Discharge: 2020-11-12 | Disposition: A | Discharge status: Home / Self Care | Payer: Medicare HMO | Source: Ambulatory Visit | Attending: Nephrology | Admitting: Nephrology

## 2020-11-12 DIAGNOSIS — Z87891 Personal history of nicotine dependence: Secondary | ICD-10-CM | POA: Diagnosis not present

## 2020-11-12 DIAGNOSIS — Z79899 Other long term (current) drug therapy: Secondary | ICD-10-CM | POA: Diagnosis not present

## 2020-11-12 DIAGNOSIS — N186 End stage renal disease: Secondary | ICD-10-CM | POA: Diagnosis not present

## 2020-11-12 DIAGNOSIS — T82590A Other mechanical complication of surgically created arteriovenous fistula, initial encounter: Secondary | ICD-10-CM | POA: Diagnosis not present

## 2020-11-12 DIAGNOSIS — T82868A Thrombosis of vascular prosthetic devices, implants and grafts, initial encounter: Secondary | ICD-10-CM | POA: Insufficient documentation

## 2020-11-12 DIAGNOSIS — Z992 Dependence on renal dialysis: Secondary | ICD-10-CM | POA: Insufficient documentation

## 2020-11-12 DIAGNOSIS — Y841 Kidney dialysis as the cause of abnormal reaction of the patient, or of later complication, without mention of misadventure at the time of the procedure: Secondary | ICD-10-CM | POA: Diagnosis not present

## 2020-11-12 DIAGNOSIS — Z7901 Long term (current) use of anticoagulants: Secondary | ICD-10-CM | POA: Diagnosis not present

## 2020-11-12 DIAGNOSIS — T82858A Stenosis of vascular prosthetic devices, implants and grafts, initial encounter: Secondary | ICD-10-CM | POA: Diagnosis not present

## 2020-11-12 HISTORY — PX: IR THROMBECTOMY AV FISTULA W/THROMBOLYSIS INC/SHUNT/IMG RIGHT: IMG6118

## 2020-11-12 HISTORY — PX: IR US GUIDE VASC ACCESS RIGHT: IMG2390

## 2020-11-12 LAB — BASIC METABOLIC PANEL
Anion gap: 13 (ref 5–15)
BUN: 26 mg/dL — ABNORMAL HIGH (ref 8–23)
CO2: 28 mmol/L (ref 22–32)
Calcium: 9.4 mg/dL (ref 8.9–10.3)
Chloride: 98 mmol/L (ref 98–111)
Creatinine, Ser: 7.22 mg/dL — ABNORMAL HIGH (ref 0.61–1.24)
GFR, Estimated: 7 mL/min — ABNORMAL LOW (ref >60)
Glucose, Bld: 86 mg/dL (ref 70–99)
Potassium: 4.8 mmol/L (ref 3.5–5.1)
Sodium: 139 mmol/L (ref 135–145)

## 2020-11-12 MED ORDER — FENTANYL CITRATE (PF) 100 MCG/2ML IJ SOLN
INTRAMUSCULAR | Status: AC
  Filled 2020-11-12: qty 2

## 2020-11-12 MED ORDER — LIDOCAINE HCL 1 % IJ SOLN
INTRAMUSCULAR | Status: AC
  Filled 2020-11-12: qty 20

## 2020-11-12 MED ORDER — FENTANYL CITRATE (PF) 100 MCG/2ML IJ SOLN
INTRAMUSCULAR | Status: AC | PRN
  Administered 2020-11-12: 50 ug via INTRAVENOUS

## 2020-11-12 MED ORDER — MIDAZOLAM HCL 2 MG/2ML IJ SOLN
INTRAMUSCULAR | Status: AC | PRN
Start: 2020-11-12 — End: 2020-11-12
  Administered 2020-11-12: 1 mg via INTRAVENOUS

## 2020-11-12 MED ORDER — IOHEXOL 300 MG/ML  SOLN
100.0000 mL | Freq: Once | INTRAMUSCULAR | Status: AC | PRN
  Administered 2020-11-12: 40 mL

## 2020-11-12 MED ORDER — SODIUM CHLORIDE 0.9 % IV SOLN: INTRAVENOUS | Status: DC

## 2020-11-12 MED ORDER — ALTEPLASE 2 MG IJ SOLR
INTRAMUSCULAR | Status: AC
  Filled 2020-11-12: qty 2

## 2020-11-12 MED ORDER — MIDAZOLAM HCL 2 MG/2ML IJ SOLN
INTRAMUSCULAR | Status: AC
  Filled 2020-11-12: qty 2

## 2020-11-12 MED ORDER — SODIUM CHLORIDE 0.9 % IV SOLN
INTRAVENOUS | Status: AC | PRN
  Administered 2020-11-12: 250 mL via INTRAVENOUS

## 2020-11-12 MED ORDER — ALTEPLASE 2 MG IJ SOLR
INTRAMUSCULAR | Status: AC | PRN
  Administered 2020-11-12: 2 mg

## 2020-11-12 MED ORDER — HEPARIN SODIUM (PORCINE) 1000 UNIT/ML IJ SOLN
INTRAMUSCULAR | Status: AC
  Filled 2020-11-12: qty 1

## 2020-11-12 MED ORDER — LIDOCAINE HCL 1 % IJ SOLN
INTRAMUSCULAR | Status: AC | PRN
  Administered 2020-11-12: 20 mL via INTRADERMAL

## 2020-11-12 NOTE — Discharge Instructions (Addendum)

## 2020-11-12 NOTE — Procedures (Signed)
Interventional Radiology Procedure Note  Procedure: RUE AVG DECLOT     Complications: None  Estimated Blood Loss:  MIN  Findings: FULL REPORT IN PACS    Tamera Punt, MD

## 2020-11-12 NOTE — H&P (Signed)
Chief Complaint: Concern for a clotted fistula. Request is for fistulogram   Referring Physician(s): Webb,Martin  Supervising Physician: Daryll Brod  Patient Status: [facility] - Out-pt  History of Present Illness: David [name] is a 79 y.o. male outpatient. 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 Team is concerned the graft is plotted. Team is requesting a fistulogram for further evaluation and possible intervention.    Patient has a previous occluded right femoral synthetic loop graft IR placed a RIJ tunneled cathter on 5.6.21. Patient is on eliquis. Patient reports issues accessing his graft during with multiple attempts at accessing the graft at each dialysis session. Patient states that he has excess bleeding post access and has never been able to complete a full dialysis session without having to transfer to his tunneled HD catheter. Currently without any significant complaints. Patient alert and laying in bed, calm and comfortable. Denies any fevers, headache, chest pain, SOB, cough, abdominal pain, nausea, vomiting or bleeding. Patient states he has minor swelling at dialysis site with numbness in his hand.      Past Medical History:  Diagnosis Date  . 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 Acute on chronic systolic and diastolic heart failure, NYHA class 4 ([place])   . Anemia, iron deficiency 11/15/2011  . Arthritis    "hands, right knee, feet" (02/21/2018)  . Atrial fibrillation ([place])   . Cardiac arrest ([place]) 02/17/2018  . CHF (congestive heart failure) ([place])   . Chronic combined systolic and diastolic heart failure, NYHA class 3 ([place]) 06/2010   felt to be secondary to hypertensive cardiomyopathy  . Chronic lower back pain   . CKD (chronic kidney disease) stage V requiring chronic dialysis ([place])   . COVID-19  12/2019  . ESRD on dialysis [facility])    started 02/2018 [place]  . GERD (gastroesophageal reflux disease)   . Glaucoma    "I had surgery and dont have it any more"  . Gout    daily RX (02/21/2018)  . Heart murmur    "mild" per pt  . Hepatitis    years ago  . HIV (human immunodeficiency virus infection) ([place])   . HIV infection ([place])   . Hyperlipidemia   . Hypertension   . Nonischemic cardiomyopathy ([place])   . Pneumonia 11/2017  . Prostate cancer ([place])    hx of prostate; s/p radioactive seed implant 10/2009 Dr Janice Norrie  . Sinus bradycardia   . Sleep apnea    does not use a cpap  . Stroke [facility])    "mini stroke" years ago  . Wears glasses     Past Surgical History:  Procedure Laterality Date  . A/V FISTULAGRAM Bilateral 07/30/2020   Procedure: CENTRAL VENOGRAM;  Surgeon: Angelia Mould, MD;  Location: [place] [facility];  Service: Cardiovascular;  Laterality: Bilateral;  bilateral upper venogram   . AV FISTULA PLACEMENT Right 10/13/2016   Procedure: ARTERIOVENOUS (AV) FISTULA CREATION;  Surgeon: [name], MD;  Location: [place];  Service: Vascular;  Laterality: Right;  . AV FISTULA PLACEMENT Left 02/25/2018   Procedure: INSERTION OF ARTERIOVENOUS (AV) GORE-TEX GRAFT LEFT UPPER ARM;  Surgeon: Angelia Mould, MD;  Location: [place];  Service: Vascular;  Laterality: Left;  . AV FISTULA PLACEMENT Right 08/07/2018   Procedure: Creation of right  arm brachiocephalic Fistula;  Surgeon: Waynetta Sandy, MD;  Location: [place];  Service: Vascular;  Laterality: Right;  . AV FISTULA PLACEMENT Left 11/10/2019   Procedure: INSERTION OF ARTERIOVENOUS (AV) GORE-TEX GRAFT THIGH;  Surgeon: Elam Dutch, MD;  Location: [place];  Service: Vascular;  Laterality: Left;  . AV FISTULA PLACEMENT Right 12/02/2019   Procedure: INSERTION OF ARTERIOVENOUS (AV) GORE-TEX GRAFT RIGHT  THIGH;  Surgeon: Waynetta Sandy, MD;  Location: [place];  Service:  Vascular;  Laterality: Right;  . AV FISTULA PLACEMENT Right 08/13/2020   Procedure: INSERTION OF RIGHT ARTERIOVENOUS (AV) GORE-TEX GRAFT ARM;  Surgeon: Angelia Mould, MD;  Location: [place];  Service: Vascular;  Laterality: Right;  . BASCILIC VEIN TRANSPOSITION Left 06/24/2015   Procedure: BASILIC VEIN TRANSPOSITION;  Surgeon: Mal Misty, MD;  Location: [place];  Service: Vascular;  Laterality: Left;  . BIOPSY  03/14/2019   Procedure: BIOPSY;  Surgeon: Irene Shipper, MD;  Location: [place];  Service: Endoscopy;;  . CATARACT EXTRACTION, BILATERAL Bilateral   . COLONOSCOPY WITH PROPOFOL N/A 03/14/2019   Procedure: COLONOSCOPY WITH PROPOFOL;  Surgeon: Irene Shipper, MD;  Location: [facility] ENDOSCOPY;  Service: Endoscopy;  Laterality: N/A;  . ESOPHAGOGASTRODUODENOSCOPY (EGD) WITH PROPOFOL N/A 05/05/2014   Procedure: ESOPHAGOGASTRODUODENOSCOPY (EGD) WITH PROPOFOL;  Surgeon: Missy Sabins, MD;  Location: [facility] ENDOSCOPY;  Service: Endoscopy;  Laterality: N/A;  . EYE SURGERY Bilateral    cataract surgery   . GLAUCOMA SURGERY Bilateral   . INSERTION OF ARTERIOVENOUS (AV) ARTEGRAFT ARM  10/09/2018   Procedure: INSERTION OF ARTERIOVENOUS (AV) 33mm x 41cm ARTEGRAFT LEFT UPPER ARM;  Surgeon: Waynetta Sandy, MD;  Location: [place];  Service: Vascular;;  . INSERTION OF DIALYSIS CATHETER Right 07/14/2019   Procedure: Insertion Of Dialysis Catheter;  Surgeon: Elam Dutch, MD;  Location: [facility] OR;  Service: Vascular;  Laterality: Right;  placed right Internal Jugular  . INSERTION OF DIALYSIS CATHETER Left 08/13/2020   Procedure: INSERTION OF DIALYSIS CATHETER;  Surgeon: Angelia Mould, MD;  Location: [place];  Service: Vascular;  Laterality: Left;  . INSERTION PROSTATE RADIATION SEED    . IR FLUORO GUIDE CV LINE RIGHT  02/18/2018  . IR REMOVAL TUN CV CATH W/O FL  12/06/2018  . IR THROMBECTOMY AV FISTULA W/THROMBOLYSIS/PTA INC/SHUNT/IMG RIGHT Right 01/26/2020  . IR THROMBECTOMY AV FISTULA  W/THROMBOLYSIS/PTA INC/SHUNT/IMG RIGHT Right 02/24/2020  . IR THROMBECTOMY AV FISTULA W/THROMBOLYSIS/PTA INC/SHUNT/IMG RIGHT Right 04/07/2020  . IR US GUIDE VASC ACCESS RIGHT  02/18/2018  . IR US GUIDE VASC ACCESS RIGHT  01/26/2020  . IR US GUIDE VASC ACCESS RIGHT  04/07/2020  . LEFT HEART CATH AND CORONARY ANGIOGRAPHY N/A 02/20/2018   Procedure: LEFT HEART CATH AND CORONARY ANGIOGRAPHY;  Surgeon: Jettie Booze, MD;  Location: [place] CV LAB;;; 50% ostial OM2, 25% mLAD.  Marland Kitchen LEFT HEART CATH AND CORONARY ANGIOGRAPHY  2004   mildly depressed LV systolic fx EF 61%,PJKDTO coronaries/abdominal aorta/renal arteries.  . LOWER EXTREMITY ANGIOGRAM Left 11/17/2019   Procedure: Lower Extremity Fistulogram;  Surgeon: Marty Heck, MD;  Location: [place];  Service: Vascular;  Laterality: Left;  . NM [place]  02/21/2010   No ischemia or infarction.  EF 27%.  Marland Kitchen RADIOACTIVE SEED IMPLANT  2010   prostate cancer  . REMOVAL OF A DIALYSIS CATHETER Right 08/13/2020   Procedure: REMOVAL OF A DIALYSIS CATHETER;  Surgeon: Angelia Mould, MD;  Location: [place];  Service: Vascular;  Laterality: Right;  . REVISION OF ARTERIOVENOUS GORETEX GRAFT Right 07/11/2019   Procedure: REVISION OF ARTERIOVENOUS GORETEX GRAFT RIGHT ARM WITH ARTEGRAFT;  Surgeon: Serafina Mitchell, MD;  Location: [place];  Service: Vascular;  Laterality: Right;  . SHUNTOGRAM Right 07/14/2019   Procedure: Shuntogram;  Surgeon: Elam Dutch, MD;  Location: [place];  Service: Vascular;  Laterality: Right;  . THROMBECTOMY AND REVISION OF ARTERIOVENTOUS (AV) GORETEX  GRAFT Right 07/14/2019   Procedure: THROMBECTOMY AND REVISION OF ARTERIOVENTOUS (AV) GORETEX  GRAFT;  Surgeon: Elam Dutch, MD;  Location: [place];  Service: Vascular;  Laterality: Right;  . THROMBECTOMY W/ EMBOLECTOMY Left 02/26/2018   Procedure: THROMBECTOMY ARTERIOVENOUS GRAFT;  Surgeon: Angelia Mould, MD;  Location: [place];  Service: Vascular;   Laterality: Left;  . THROMBECTOMY W/ EMBOLECTOMY Left 11/17/2019   Procedure: THROMBECTOMY ARTERIOVENOUS GRAFT LEFT THIGH;  Surgeon: Marty Heck, MD;  Location: [place];  Service: Vascular;  Laterality: Left;  . TRANSTHORACIC ECHOCARDIOGRAM  02/2012   [facility]) EF 45-50% with mild global HK.  Mild LVH,LA mod. dilated,mild-mod. 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 AI.  Marland Kitchen TRANSTHORACIC ECHOCARDIOGRAM  9/'18; 3/'19   a) Severely reduced EF.  25 from 30%.  GR 1 DD with diffuse ecchymosis. Mild AS & AI.  Mild LA/RA dilation; b) (post PEA arest):   Moderately dilated LV with moderate concentric virtually.  EF 15 to 20%.  GR 1 DD.  Paradoxical septal motion. Mild AI.  Mild LA dilation.  Poorly visualized RV.  Marland Kitchen TRANSTHORACIC ECHOCARDIOGRAM  02/2019   EF 20-25%.  Unable to assess diastolic function (A. Fib).  No LV apical thrombus.  Mild AI.  Mildly reduced RV function, however poorly visualized.  Marland Kitchen ULTRASOUND GUIDANCE FOR VASCULAR ACCESS  02/20/2018   Procedure: Ultrasound Guidance For Vascular Access;  Surgeon: Jettie Booze, MD;  Location: [place] CV LAB;  Service: Cardiovascular;;  . UPPER EXTREMITY VENOGRAPHY N/A 07/08/2018   Procedure: UPPER EXTREMITY VENOGRAPHY;  Surgeon: Waynetta Sandy, MD;  Location: [place] [facility];  Service: Cardiovascular;  Laterality: N/A;  Bilateral    Allergies: Dextromethorphan-guaifenesin, Tocotrienols, and Losartan potassium  Medications: Prior to Admission medications   Medication Sig Start Date End Date Taking? Authorizing Provider  acetaminophen (TYLENOL) 500 MG tablet Take 1,000 mg by mouth every 6 (six) hours as needed for moderate pain.   Yes [provider]  allopurinol (ZYLOPRIM) 100 MG tablet TAKE 1 TABLET BY MOUTH 4 TIMES WEEKLY( MONDAY, TUESDAY,THURDSAY AND SATURDAY) AFTER DIALYSIS 10/20/20  Yes Croitoru, Mihai, MD  amiodarone (PACERONE) 200 MG tablet TAKE 1 TABLET(200 MG) BY MOUTH DAILY  10/20/20  Yes Croitoru, Mihai, MD  apixaban (ELIQUIS) 5 MG TABS tablet Take 1 tablet (5 mg total) by mouth 2 (two) times daily. 06/24/20  Yes Croitoru, Mihai, MD  [name] 50-200-25 MG TABS tablet TAKE 1 TABLET BY MOUTH DAILY 09/14/20  Yes Tommy Medal, Lavell Islam, MD  heparin 1000 unit/mL SOLN injection Heparin Sodium (Porcine) 1,000 Units/mL Catheter Lock Arterial 08/21/20 08/20/21 Yes [provider]  lidocaine-prilocaine (EMLA) cream Apply topically. 09/06/20  Yes [provider]  midodrine (PROAMATINE) 10 MG tablet Take 1 tablet (10 mg total) by mouth as directed. Twice a day on the day of HD Tuesday, Thursday, Saturday (1 in AM and 1 at [place]). Take 1/2 tablet (5mg ) a day on non HD days Patient taking differently: Take 10-20 mg by mouth See admin instructions. Take 10 mg in the morning  at 5 AM. take 10 mg at 6AM of dialysis days and 10 mg half way through treatment on dialysis days (Mon, Tues, [place], and Sat) 04/22/19  Yes Leonie Man, MD  montelukast (SINGULAIR) 10 MG tablet Take 1 tablet (10 mg total) by mouth at bedtime. 11/03/20 12/03/20 Yes [name], NP  multivitamin (RENA-VIT) TABS tablet Take 1 tablet by mouth every evening. 05/26/20  Yes [provider]  ofloxacin (OCUFLOX) 0.3 % ophthalmic solution Place 1 drop into the right eye 4 (four) times daily. 06/23/20  Yes [provider]  polyethylene glycol (MIRALAX / GLYCOLAX) 17 g packet Take 17 g by mouth daily as needed for mild constipation or moderate constipation.    Yes [provider]  sucroferric oxyhydroxide (VELPHORO) 500 MG chewable tablet Chew 500 mg by mouth 3 (three) times daily with meals.   Yes [provider]  DIPHENHYDRAMINE HCL PO Take by mouth. 10/21/19 10/19/20  [provider]  oxyCODONE (ROXICODONE) 5 MG immediate release tablet Take 1 tablet (5 mg total) by mouth every 4 (four) hours as needed. 08/13/20   Angelia Mould, MD     Family History   Problem Relation Age of Onset  . Hypertension Mother   . Thyroid disease Mother   . Cholelithiasis Daughter   . Cholelithiasis Son   . Hypertension Maternal Grandmother   . Diabetes Maternal Grandmother   . Heart attack Neg Hx   . Hyperlipidemia Neg Hx     Social History   Socioeconomic History  . Marital status: Married    Spouse name: Not on file  . Number of children: 2  . Years of education: 74  . Highest education level: Not on file  Occupational History  . 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 Smoking status: Former Smoker    Packs/day: 1.00    Years: 30.00    Pack years: 30.00    Types: Cigarettes    Quit date: 2006    Years since quitting: 15.9  . Smokeless tobacco: Never Used  Vaping Use  . Vaping Use: Never used  Substance and Sexual Activity  . Alcohol use: Yes    Alcohol/week: 1.0 standard drink    Types: 1 Shots of liquor per week    Comment: occasional  . Drug use: Never  . Sexual activity: Not Currently    Partners: Female    Comment: declined condoms 09/2019  Other Topics Concern  . 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   Social Determinants of Health   Financial Resource Strain: Low Risk   . Difficulty of Paying Living Expenses: Not hard at all  Food Insecurity: No Food Insecurity  . Worried About Charity fundraiser in the Last Year: Never true  . Ran Out of Food in the Last Year: Never true  Transportation Needs: No Transportation Needs  . Lack of Transportation (Medical): No  . Lack of Transportation (Non-Medical): No  Physical Activity: Not on file  Stress: Not on file  Social Connections: Not on file     Review of Systems: A 12 point ROS discussed and pertinent positives are  indicated  in the HPI above.  All other systems are negative.  Review of Systems  Constitutional: Negative for fever.  HENT: Negative for congestion.   Respiratory: Negative for cough and shortness of breath.   Cardiovascular: Negative for chest pain.  Gastrointestinal: Negative for abdominal pain.  Neurological: Negative for headaches.       Tingling in right hand.   Psychiatric/Behavioral: Negative for behavioral problems and confusion.    Vital Signs: There were no vitals taken for this visit.  Physical Exam Vitals and nursing note reviewed.  Constitutional:      Appearance: He is well-developed and well-nourished.  HENT:     Head: Normocephalic.  Cardiovascular:     Rate and Rhythm: Normal rate and regular rhythm.     Comments: Right upper arm loop graft -/- Pulmonary:     Effort: Pulmonary effort is normal.     Breath sounds: Normal breath sounds.  Musculoskeletal:        General: Normal range of motion.     Cervical back: Normal range of motion.  Skin:    General: Skin is dry.  Neurological:     Mental Status: He is alert and oriented to person, place, and time.  Psychiatric:        Mood and Affect: Mood and affect normal.     Imaging: No results found.  Labs:  CBC: Recent Labs    05/17/20 0609 05/17/20 0716 05/18/20 0614 06/28/20 1517 07/30/20 0821 08/13/20 0829  WBC 5.7 6.2 5.8 4.7  --   --   HGB 9.4* 9.3* 9.5* 12.1* 10.5* 12.6*  HCT 28.4* 29.2* 28.8* 35.6* 31.0* 37.0*  PLT 170 199 176 183  --   --     COAGS: Recent Labs    04/23/20 0000 05/15/20 0452 05/24/20 1323 08/19/20 0000  INR 1.9* 1.2 1.7* 1.3*    BMP: Recent Labs    05/16/20 0724 05/17/20 0609 05/17/20 0715 05/18/20 0614 05/24/20 1323 07/30/20 0821 08/13/20 0829  NA 137 137 137 136 137 139 141  K 3.8 4.8 4.4 5.0 3.8 5.1 4.9  CL 99 99 98 99 95* 100 102  CO2 26 26 25 25 30   --   --   GLUCOSE 77 96 103* 94 126* 95 101*  BUN 23 38* 38* 27* 34* 28* 36*  CALCIUM 8.0* 8.9 9.0 9.3  9.2  --   --   CREATININE 5.96* 9.20* 9.14* 6.78* 5.80* 6.90* 7.30*  GFRNONAA 8* 5* 5* 7*  --   --   --   GFRAA 10* 6* 6* 8*  --   --   --     LIVER FUNCTION TESTS: Recent Labs    05/16/20 0724 05/17/20 0609 05/17/20 0715 05/18/20 0614 05/24/20 1323  BILITOT 1.5* 1.1  --  1.4* 0.5  AST 144* 79*  --  60* 18  ALT 279* 206*  --  155* 39  ALKPHOS 341* 297*  --  269* 214*  PROT 6.4* 6.2*  --  6.2* 7.3  ALBUMIN 3.2* 3.2* 3.3* 3.3* 4.6    Assessment and Plan:  79 y.o. male outpatient. History of ESRD on HD via a RUE AVG created on 9.10.21 Patient was seen by vascular surgery on 9.15.21 and 10.6.21 for right arm swelling  and tingling but per notes did not have steal syndrome at that time. Team is concerned the graft is clotted. Team is requesting a fistulogram for further evaluation and possible intervention.    Patient had a  previous occluded right femoral synthetis  loop graft.  IR placed a RIJ on 5.6.21. Potassium is 4.8.  Patient is on eliquis. Patient has been NPO.  Risks and benefits discussed with the patient including, but not limited to bleeding, infection, vascular injury, pulmonary embolism, need for tunneled HD catheter placement or even death.  All of the patient's questions were answered, patient is agreeable to proceed. Consent signed and in chart.   Thank you for this interesting consult.  I greatly enjoyed meeting [facility] and look forward to participating in their care.  A copy of this report was sent to the requesting provider on this date.  Electronically Signed: Jacqualine Mau, NP 11/12/2020, 11:19 AM   I spent a total of  30 Minutes   in face to face in clinical consultation, greater than 50% of which was counseling/coordinating care for fistulogram possible intervention

## 2020-11-13 DIAGNOSIS — Z992 Dependence on renal dialysis: Secondary | ICD-10-CM | POA: Diagnosis not present

## 2020-11-13 DIAGNOSIS — N186 End stage renal disease: Secondary | ICD-10-CM | POA: Diagnosis not present

## 2020-11-13 DIAGNOSIS — N2581 Secondary hyperparathyroidism of renal origin: Secondary | ICD-10-CM | POA: Diagnosis not present

## 2020-11-15 DIAGNOSIS — N2581 Secondary hyperparathyroidism of renal origin: Secondary | ICD-10-CM | POA: Diagnosis not present

## 2020-11-15 DIAGNOSIS — Z992 Dependence on renal dialysis: Secondary | ICD-10-CM | POA: Diagnosis not present

## 2020-11-15 DIAGNOSIS — N186 End stage renal disease: Secondary | ICD-10-CM | POA: Diagnosis not present

## 2020-11-16 DIAGNOSIS — N2581 Secondary hyperparathyroidism of renal origin: Secondary | ICD-10-CM | POA: Diagnosis not present

## 2020-11-16 DIAGNOSIS — Z992 Dependence on renal dialysis: Secondary | ICD-10-CM | POA: Diagnosis not present

## 2020-11-16 DIAGNOSIS — N186 End stage renal disease: Secondary | ICD-10-CM | POA: Diagnosis not present

## 2020-11-18 DIAGNOSIS — Z992 Dependence on renal dialysis: Secondary | ICD-10-CM | POA: Diagnosis not present

## 2020-11-18 DIAGNOSIS — N186 End stage renal disease: Secondary | ICD-10-CM | POA: Diagnosis not present

## 2020-11-18 DIAGNOSIS — N2581 Secondary hyperparathyroidism of renal origin: Secondary | ICD-10-CM | POA: Diagnosis not present

## 2020-11-20 DIAGNOSIS — N2581 Secondary hyperparathyroidism of renal origin: Secondary | ICD-10-CM | POA: Diagnosis not present

## 2020-11-20 DIAGNOSIS — N186 End stage renal disease: Secondary | ICD-10-CM | POA: Diagnosis not present

## 2020-11-20 DIAGNOSIS — Z992 Dependence on renal dialysis: Secondary | ICD-10-CM | POA: Diagnosis not present

## 2020-11-22 DIAGNOSIS — Z992 Dependence on renal dialysis: Secondary | ICD-10-CM | POA: Diagnosis not present

## 2020-11-22 DIAGNOSIS — N186 End stage renal disease: Secondary | ICD-10-CM | POA: Diagnosis not present

## 2020-11-22 DIAGNOSIS — N2581 Secondary hyperparathyroidism of renal origin: Secondary | ICD-10-CM | POA: Diagnosis not present

## 2020-11-23 DIAGNOSIS — Z992 Dependence on renal dialysis: Secondary | ICD-10-CM | POA: Diagnosis not present

## 2020-11-23 DIAGNOSIS — N186 End stage renal disease: Secondary | ICD-10-CM | POA: Diagnosis not present

## 2020-11-23 DIAGNOSIS — N2581 Secondary hyperparathyroidism of renal origin: Secondary | ICD-10-CM | POA: Diagnosis not present

## 2020-11-24 ENCOUNTER — Other Ambulatory Visit: Payer: Self-pay

## 2020-11-24 ENCOUNTER — Ambulatory Visit (INDEPENDENT_AMBULATORY_CARE_PROVIDER_SITE_OTHER): Payer: Medicare HMO | Admitting: Family Medicine

## 2020-11-24 ENCOUNTER — Encounter: Payer: Self-pay | Admitting: Family Medicine

## 2020-11-24 VITALS — BP 132/76 | HR 79 | Temp 99.6°F | Ht 74.0 in | Wt 192.4 lb

## 2020-11-24 DIAGNOSIS — M7989 Other specified soft tissue disorders: Secondary | ICD-10-CM

## 2020-11-24 DIAGNOSIS — R110 Nausea: Secondary | ICD-10-CM | POA: Diagnosis not present

## 2020-11-24 NOTE — Patient Instructions (Signed)
We will be in touch regarding your breath test.   Someone will reach out regarding your imaging.   Let us know if you need anything.

## 2020-11-24 NOTE — Progress Notes (Signed)
Chief Complaint  Patient presents with  . Groin Pain  . Abdominal Pain    Nausea     Subjective [name] is a 79 y.o. male who presents with nausea. Here w wife.  Symptoms began 1 mo ago Patient has nausea, alternating normal BM's and constipation/gas, decreased appetite, ~5 lb wt loss Patient denies cramping, vomiting, fever and headache, nighttime awakenings, recent travel, medication change Evaluation to date: no  Sick contacts: none known   Groin pain 2 days ago, the patient noticed blood in his underwear.  He does not make urine as he is in end-stage renal failure.  There is no injury.  He denies any insidious/progressive onset.  No redness, bruising, testicular pain, discharge, fevers.  Past Medical History:  Diagnosis Date  . 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 Acute on chronic systolic and diastolic heart failure, NYHA class 4 ([place])   . Anemia, iron deficiency 11/15/2011  . Arthritis    "hands, right knee, feet" (02/21/2018)  . Atrial fibrillation ([place])   . Cardiac arrest ([place]) 02/17/2018  . CHF (congestive heart failure) ([place])   . Chronic combined systolic and diastolic heart failure, NYHA class 3 ([place]) 06/2010   felt to be secondary to hypertensive cardiomyopathy  . Chronic lower back pain   . CKD (chronic kidney disease) stage V requiring chronic dialysis ([place])   . COVID-19 12/2019  . ESRD on dialysis [facility])    started 02/2018 [place]  . GERD (gastroesophageal reflux disease)   . Glaucoma    "I had surgery and dont have it any more"  . Gout    daily RX (02/21/2018)  . Heart murmur    "mild" per pt  . Hepatitis    years ago  . HIV (human immunodeficiency virus infection) ([place])   . HIV infection ([place])   . Hyperlipidemia   . Hypertension   . Nonischemic cardiomyopathy ([place])   . Pneumonia 11/2017  . Prostate cancer ([place])    hx of prostate; s/p radioactive seed implant 10/2009 Dr Janice Norrie  . Sinus bradycardia   . Sleep  apnea    does not use a cpap  . Stroke [facility])    "mini stroke" years ago  . Wears glasses    Past Surgical History:  Procedure Laterality Date  . A/V FISTULAGRAM Bilateral 07/30/2020   Procedure: CENTRAL VENOGRAM;  Surgeon: Angelia Mould, MD;  Location: [place] [facility];  Service: Cardiovascular;  Laterality: Bilateral;  bilateral upper venogram   . AV FISTULA PLACEMENT Right 10/13/2016   Procedure: ARTERIOVENOUS (AV) FISTULA CREATION;  Surgeon: [name], MD;  Location: [place];  Service: Vascular;  Laterality: Right;  . AV FISTULA PLACEMENT Left 02/25/2018   Procedure: INSERTION OF ARTERIOVENOUS (AV) GORE-TEX GRAFT LEFT UPPER ARM;  Surgeon: Angelia Mould, MD;  Location: [place];  Service: Vascular;  Laterality: Left;  . AV FISTULA PLACEMENT Right 08/07/2018   Procedure: Creation of right arm brachiocephalic Fistula;  Surgeon: Waynetta Sandy, MD;  Location: [place];  Service: Vascular;  Laterality: Right;  . AV FISTULA PLACEMENT Left 11/10/2019   Procedure: INSERTION OF ARTERIOVENOUS (AV) GORE-TEX GRAFT THIGH;  Surgeon: Elam Dutch, MD;  Location: [place];  Service: Vascular;  Laterality: Left;  . AV FISTULA PLACEMENT Right 12/02/2019   Procedure: INSERTION OF ARTERIOVENOUS (AV) GORE-TEX GRAFT RIGHT  THIGH;  Surgeon: Waynetta Sandy, MD;  Location: [place];  Service: Vascular;  Laterality: Right;  . AV FISTULA PLACEMENT  Right 08/13/2020   Procedure: INSERTION OF RIGHT ARTERIOVENOUS (AV) GORE-TEX GRAFT ARM;  Surgeon: Angelia Mould, MD;  Location: [place];  Service: Vascular;  Laterality: Right;  . BASCILIC VEIN TRANSPOSITION Left 06/24/2015   Procedure: BASILIC VEIN TRANSPOSITION;  Surgeon: Mal Misty, MD;  Location: [place];  Service: Vascular;  Laterality: Left;  . BIOPSY  03/14/2019   Procedure: BIOPSY;  Surgeon: Irene Shipper, MD;  Location: [place];  Service: Endoscopy;;  . CATARACT EXTRACTION, BILATERAL Bilateral   . COLONOSCOPY WITH  PROPOFOL N/A 03/14/2019   Procedure: COLONOSCOPY WITH PROPOFOL;  Surgeon: Irene Shipper, MD;  Location: [facility] ENDOSCOPY;  Service: Endoscopy;  Laterality: N/A;  . ESOPHAGOGASTRODUODENOSCOPY (EGD) WITH PROPOFOL N/A 05/05/2014   Procedure: ESOPHAGOGASTRODUODENOSCOPY (EGD) WITH PROPOFOL;  Surgeon: Missy Sabins, MD;  Location: [facility] ENDOSCOPY;  Service: Endoscopy;  Laterality: N/A;  . EYE SURGERY Bilateral    cataract surgery   . GLAUCOMA SURGERY Bilateral   . INSERTION OF ARTERIOVENOUS (AV) ARTEGRAFT ARM  10/09/2018   Procedure: INSERTION OF ARTERIOVENOUS (AV) 57mm x 41cm ARTEGRAFT LEFT UPPER ARM;  Surgeon: Waynetta Sandy, MD;  Location: [place];  Service: Vascular;;  . INSERTION OF DIALYSIS CATHETER Right 07/14/2019   Procedure: Insertion Of Dialysis Catheter;  Surgeon: Elam Dutch, MD;  Location: [facility] OR;  Service: Vascular;  Laterality: Right;  placed right Internal Jugular  . INSERTION OF DIALYSIS CATHETER Left 08/13/2020   Procedure: INSERTION OF DIALYSIS CATHETER;  Surgeon: Angelia Mould, MD;  Location: [place];  Service: Vascular;  Laterality: Left;  . INSERTION PROSTATE RADIATION SEED    . IR FLUORO GUIDE CV LINE RIGHT  02/18/2018  . IR REMOVAL TUN CV CATH W/O FL  12/06/2018  . IR THROMBECTOMY AV FISTULA W/THROMBOLYSIS INC/SHUNT/IMG RIGHT  11/12/2020  . IR THROMBECTOMY AV FISTULA W/THROMBOLYSIS/PTA INC/SHUNT/IMG RIGHT Right 01/26/2020  . IR THROMBECTOMY AV FISTULA W/THROMBOLYSIS/PTA INC/SHUNT/IMG RIGHT Right 02/24/2020  . IR THROMBECTOMY AV FISTULA W/THROMBOLYSIS/PTA INC/SHUNT/IMG RIGHT Right 04/07/2020  . IR US GUIDE VASC ACCESS RIGHT  02/18/2018  . IR US GUIDE VASC ACCESS RIGHT  01/26/2020  . IR US GUIDE VASC ACCESS RIGHT  04/07/2020  . IR US GUIDE VASC ACCESS RIGHT  11/12/2020  . LEFT HEART CATH AND CORONARY ANGIOGRAPHY N/A 02/20/2018   Procedure: LEFT HEART CATH AND CORONARY ANGIOGRAPHY;  Surgeon: Jettie Booze, MD;  Location: [place] [facility];;; 50% ostial OM2, 25% mLAD.  Marland Kitchen  LEFT HEART CATH AND CORONARY ANGIOGRAPHY  2004   mildly depressed LV systolic fx EF 62%,BJSEGB coronaries/abdominal aorta/renal arteries.  . LOWER EXTREMITY ANGIOGRAM Left 11/17/2019   Procedure: Lower Extremity Fistulogram;  Surgeon: Marty Heck, MD;  Location: [place];  Service: Vascular;  Laterality: Left;  . NM [place]  02/21/2010   No ischemia or infarction.  EF 27%.  Marland Kitchen RADIOACTIVE SEED IMPLANT  2010   prostate cancer  . REMOVAL OF A DIALYSIS CATHETER Right 08/13/2020   Procedure: REMOVAL OF A DIALYSIS CATHETER;  Surgeon: Angelia Mould, MD;  Location: [facility] OR;  Service: Vascular;  Laterality: Right;  . REVISION OF ARTERIOVENOUS GORETEX GRAFT Right 07/11/2019   Procedure: REVISION OF ARTERIOVENOUS GORETEX GRAFT RIGHT ARM WITH ARTEGRAFT;  Surgeon: Serafina Mitchell, MD;  Location: [place];  Service: Vascular;  Laterality: Right;  . SHUNTOGRAM Right 07/14/2019   Procedure: Shuntogram;  Surgeon: Elam Dutch, MD;  Location: [place];  Service: Vascular;  Laterality: Right;  . THROMBECTOMY AND REVISION OF  ARTERIOVENTOUS (AV) GORETEX  GRAFT Right 07/14/2019   Procedure: THROMBECTOMY AND REVISION OF ARTERIOVENTOUS (AV) GORETEX  GRAFT;  Surgeon: Elam Dutch, MD;  Location: [place];  Service: Vascular;  Laterality: Right;  . THROMBECTOMY W/ EMBOLECTOMY Left 02/26/2018   Procedure: THROMBECTOMY ARTERIOVENOUS GRAFT;  Surgeon: Angelia Mould, MD;  Location: [place];  Service: Vascular;  Laterality: Left;  . THROMBECTOMY W/ EMBOLECTOMY Left 11/17/2019   Procedure: THROMBECTOMY ARTERIOVENOUS GRAFT LEFT THIGH;  Surgeon: Marty Heck, MD;  Location: [place];  Service: Vascular;  Laterality: Left;  . TRANSTHORACIC ECHOCARDIOGRAM  02/2012   [facility]) EF 45-50% with mild global HK.  Mild LVH,LA mod. dilated,mild-mod. 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 AI.  Marland Kitchen TRANSTHORACIC ECHOCARDIOGRAM  9/'18; 3/'19   a) Severely reduced EF.  25 from 30%.   GR 1 DD with diffuse ecchymosis. Mild AS & AI.  Mild LA/RA dilation; b) (post PEA arest):   Moderately dilated LV with moderate concentric virtually.  EF 15 to 20%.  GR 1 DD.  Paradoxical septal motion. Mild AI.  Mild LA dilation.  Poorly visualized RV.  Marland Kitchen TRANSTHORACIC ECHOCARDIOGRAM  02/2019   EF 20-25%.  Unable to assess diastolic function (A. Fib).  No LV apical thrombus.  Mild AI.  Mildly reduced RV function, however poorly visualized.  Marland Kitchen ULTRASOUND GUIDANCE FOR VASCULAR ACCESS  02/20/2018   Procedure: Ultrasound Guidance For Vascular Access;  Surgeon: Jettie Booze, MD;  Location: [place] [facility];  Service: Cardiovascular;;  . UPPER EXTREMITY VENOGRAPHY N/A 07/08/2018   Procedure: UPPER EXTREMITY VENOGRAPHY;  Surgeon: Waynetta Sandy, MD;  Location: [place] [facility];  Service: Cardiovascular;  Laterality: N/A;  Bilateral   Current Outpatient Medications on File Prior to Visit  Medication Sig Dispense Refill  . acetaminophen (TYLENOL) 500 MG tablet Take 1,000 mg by mouth every 6 (six) hours as needed for moderate pain.    Marland Kitchen allopurinol (ZYLOPRIM) 100 MG tablet TAKE 1 TABLET BY MOUTH 4 TIMES WEEKLY( MONDAY, TUESDAY,THURDSAY AND SATURDAY) AFTER DIALYSIS 45 tablet 5  . amiodarone (PACERONE) 200 MG tablet TAKE 1 TABLET(200 MG) BY MOUTH DAILY 90 tablet 3  . apixaban (ELIQUIS) 5 MG TABS tablet Take 1 tablet (5 mg total) by mouth 2 (two) times daily. 60 tablet 5  . [name] 50-200-25 MG TABS tablet TAKE 1 TABLET BY MOUTH DAILY 30 tablet 5  . heparin 1000 unit/mL SOLN injection Heparin Sodium (Porcine) 1,000 Units/mL Catheter Lock Arterial    . lidocaine-prilocaine (EMLA) cream Apply topically.    . midodrine (PROAMATINE) 10 MG tablet Take 1 tablet (10 mg total) by mouth as directed. Twice a day on the day of HD Tuesday, Thursday, Saturday (1 in AM and 1 at [place]). Take 1/2 tablet (5mg ) a day on non HD days (Patient taking differently: Take 10-20 mg by mouth See admin instructions.  Take 10 mg in the morning at 5 AM. take 10 mg at 6AM of dialysis days and 10 mg half way through treatment on dialysis days (Mon, Tues, Thurs, and Sat)) 90 tablet 5  . montelukast (SINGULAIR) 10 MG tablet Take 1 tablet (10 mg total) by mouth at bedtime. 30 tablet 5  . multivitamin (RENA-VIT) TABS tablet Take 1 tablet by mouth every evening.    Marland Kitchen ofloxacin (OCUFLOX) 0.3 % ophthalmic solution Place 1 drop into the right eye 4 (four) times daily.    Marland Kitchen oxyCODONE (ROXICODONE) 5 MG immediate release tablet Take 1 tablet (5 mg  total) by mouth every 4 (four) hours as needed. 15 tablet 0  . polyethylene glycol (MIRALAX / GLYCOLAX) 17 g packet Take 17 g by mouth daily as needed for mild constipation or moderate constipation.     . sucroferric oxyhydroxide (VELPHORO) 500 MG chewable tablet Chew 500 mg by mouth 3 (three) times daily with meals.    Marland Kitchen DIPHENHYDRAMINE HCL PO Take by mouth.     Current Facility-Administered Medications on File Prior to Visit  Medication Dose Route Frequency Provider Last Rate Last Admin  . 0.9 %  sodium chloride infusion  250 mL Intravenous PRN Angelia Mould, MD       Allergies  Allergen Reactions  . Dextromethorphan-Guaifenesin Other (See Comments)    Unknown reaction  . Tocotrienols Other (See Comments)    Unknown reaction  . Losartan Potassium Other (See Comments)    Causes constipation    Exam BP 132/76 (BP Location: Left Arm, Patient Position: Sitting, Cuff Size: Normal)   Pulse 79   Temp 99.6 F (37.6 C) (Oral)   Ht 6\' 2"  (1.88 m)   Wt 192 lb 6 oz (87.3 kg)   SpO2 98%   BMI 24.70 kg/m  General:  well developed, well hydrated, in no apparent distress Lungs:  clear to auscultation, breath sounds equal bilaterally, no respiratory distress, no wheezes Cardio:  regular rate and rhythm Abdomen:  abdomen soft, nontender; bowel sounds normal; no masses or organomegaly GU: Meatus is normal and without trauma or bleeding; there is no blood in his underwear.   When feeling in the upper right portion of the mons pubis, there is an indurated area that is tender to palpation.  There is no overlying excessive warmth, erythema, ecchymosis.  It encroaches upon the right inguinal canal but does not feel like a hernia/bowel. Psych: Appropriate judgement/insight  Assessment and Plan  Soft tissue mass - Plan: US Scrotum  Nausea - Plan: H. pylori breath test  1.  Check ultrasound.  Doubt hernia, may need to get a CT scan.  Could be soft tissue edema from trauma? 2.  Check breath test today.  If normal, will start Protonix.  Follow-up in 2-3 weeks with regular PCP.  If no improvement at that time with continued weight loss, would refer to gastroenterology. The patient and his wife voiced understanding and agreement to the plan.  [place], DO 11/24/20  2:14 PM

## 2020-11-25 ENCOUNTER — Other Ambulatory Visit: Payer: Self-pay | Admitting: Family Medicine

## 2020-11-25 DIAGNOSIS — Z992 Dependence on renal dialysis: Secondary | ICD-10-CM | POA: Diagnosis not present

## 2020-11-25 DIAGNOSIS — N2581 Secondary hyperparathyroidism of renal origin: Secondary | ICD-10-CM | POA: Diagnosis not present

## 2020-11-25 DIAGNOSIS — N186 End stage renal disease: Secondary | ICD-10-CM | POA: Diagnosis not present

## 2020-11-25 LAB — H. PYLORI BREATH TEST: H. pylori Breath Test: NOT DETECTED

## 2020-11-25 MED ORDER — PANTOPRAZOLE SODIUM 40 MG PO TBEC: 40.0000 mg | DELAYED_RELEASE_TABLET | Freq: Every day | ORAL | 3 refills | Status: DC

## 2020-11-28 DIAGNOSIS — Z992 Dependence on renal dialysis: Secondary | ICD-10-CM | POA: Diagnosis not present

## 2020-11-28 DIAGNOSIS — N186 End stage renal disease: Secondary | ICD-10-CM | POA: Diagnosis not present

## 2020-11-28 DIAGNOSIS — N2581 Secondary hyperparathyroidism of renal origin: Secondary | ICD-10-CM | POA: Diagnosis not present

## 2020-11-30 ENCOUNTER — Encounter (HOSPITAL_COMMUNITY): Payer: Self-pay

## 2020-11-30 ENCOUNTER — Emergency Department (HOSPITAL_COMMUNITY): Payer: Medicare HMO

## 2020-11-30 ENCOUNTER — Inpatient Hospital Stay (HOSPITAL_COMMUNITY)
Admission: EM | Admit: 2020-11-30 | Discharge: 2020-12-03 | DRG: 193 | Disposition: A | Discharge status: Home / Self Care | Payer: Medicare HMO | Attending: Internal Medicine | Admitting: Internal Medicine

## 2020-11-30 ENCOUNTER — Other Ambulatory Visit: Payer: Self-pay

## 2020-11-30 DIAGNOSIS — Z9841 Cataract extraction status, right eye: Secondary | ICD-10-CM

## 2020-11-30 DIAGNOSIS — Z992 Dependence on renal dialysis: Secondary | ICD-10-CM | POA: Diagnosis not present

## 2020-11-30 DIAGNOSIS — I482 Chronic atrial fibrillation, unspecified: Secondary | ICD-10-CM | POA: Diagnosis present

## 2020-11-30 DIAGNOSIS — E8770 Fluid overload, unspecified: Secondary | ICD-10-CM | POA: Diagnosis not present

## 2020-11-30 DIAGNOSIS — Z8546 Personal history of malignant neoplasm of prostate: Secondary | ICD-10-CM

## 2020-11-30 DIAGNOSIS — B20 Human immunodeficiency virus [HIV] disease: Secondary | ICD-10-CM | POA: Diagnosis present

## 2020-11-30 DIAGNOSIS — R634 Abnormal weight loss: Secondary | ICD-10-CM | POA: Diagnosis present

## 2020-11-30 DIAGNOSIS — K8020 Calculus of gallbladder without cholecystitis without obstruction: Secondary | ICD-10-CM | POA: Diagnosis present

## 2020-11-30 DIAGNOSIS — R0602 Shortness of breath: Secondary | ICD-10-CM | POA: Diagnosis not present

## 2020-11-30 DIAGNOSIS — Z9842 Cataract extraction status, left eye: Secondary | ICD-10-CM

## 2020-11-30 DIAGNOSIS — I5020 Unspecified systolic (congestive) heart failure: Secondary | ICD-10-CM | POA: Diagnosis not present

## 2020-11-30 DIAGNOSIS — Z87891 Personal history of nicotine dependence: Secondary | ICD-10-CM

## 2020-11-30 DIAGNOSIS — Z833 Family history of diabetes mellitus: Secondary | ICD-10-CM

## 2020-11-30 DIAGNOSIS — R000 Tachycardia, unspecified: Secondary | ICD-10-CM | POA: Diagnosis not present

## 2020-11-30 DIAGNOSIS — R011 Cardiac murmur, unspecified: Secondary | ICD-10-CM | POA: Diagnosis present

## 2020-11-30 DIAGNOSIS — L89312 Pressure ulcer of right buttock, stage 2: Secondary | ICD-10-CM | POA: Clinically undetermined

## 2020-11-30 DIAGNOSIS — I2510 Atherosclerotic heart disease of native coronary artery without angina pectoris: Secondary | ICD-10-CM | POA: Diagnosis present

## 2020-11-30 DIAGNOSIS — I5043 Acute on chronic combined systolic (congestive) and diastolic (congestive) heart failure: Secondary | ICD-10-CM | POA: Diagnosis not present

## 2020-11-30 DIAGNOSIS — R1110 Vomiting, unspecified: Secondary | ICD-10-CM | POA: Diagnosis not present

## 2020-11-30 DIAGNOSIS — E785 Hyperlipidemia, unspecified: Secondary | ICD-10-CM | POA: Diagnosis present

## 2020-11-30 DIAGNOSIS — M109 Gout, unspecified: Secondary | ICD-10-CM | POA: Diagnosis present

## 2020-11-30 DIAGNOSIS — Z6824 Body mass index (BMI) 24.0-24.9, adult: Secondary | ICD-10-CM

## 2020-11-30 DIAGNOSIS — D509 Iron deficiency anemia, unspecified: Secondary | ICD-10-CM | POA: Diagnosis present

## 2020-11-30 DIAGNOSIS — Z20822 Contact with and (suspected) exposure to covid-19: Secondary | ICD-10-CM | POA: Diagnosis not present

## 2020-11-30 DIAGNOSIS — I953 Hypotension of hemodialysis: Secondary | ICD-10-CM | POA: Diagnosis present

## 2020-11-30 DIAGNOSIS — R531 Weakness: Secondary | ICD-10-CM | POA: Diagnosis not present

## 2020-11-30 DIAGNOSIS — N2581 Secondary hyperparathyroidism of renal origin: Secondary | ICD-10-CM | POA: Diagnosis present

## 2020-11-30 DIAGNOSIS — N250 Renal osteodystrophy: Secondary | ICD-10-CM | POA: Diagnosis not present

## 2020-11-30 DIAGNOSIS — Z7901 Long term (current) use of anticoagulants: Secondary | ICD-10-CM

## 2020-11-30 DIAGNOSIS — J189 Pneumonia, unspecified organism: Principal | ICD-10-CM | POA: Diagnosis present

## 2020-11-30 DIAGNOSIS — Z79899 Other long term (current) drug therapy: Secondary | ICD-10-CM

## 2020-11-30 DIAGNOSIS — J811 Chronic pulmonary edema: Secondary | ICD-10-CM | POA: Diagnosis not present

## 2020-11-30 DIAGNOSIS — I5023 Acute on chronic systolic (congestive) heart failure: Secondary | ICD-10-CM | POA: Diagnosis not present

## 2020-11-30 DIAGNOSIS — R6510 Systemic inflammatory response syndrome (SIRS) of non-infectious origin without acute organ dysfunction: Secondary | ICD-10-CM | POA: Diagnosis present

## 2020-11-30 DIAGNOSIS — Z8673 Personal history of transient ischemic attack (TIA), and cerebral infarction without residual deficits: Secondary | ICD-10-CM

## 2020-11-30 DIAGNOSIS — R0902 Hypoxemia: Secondary | ICD-10-CM | POA: Diagnosis not present

## 2020-11-30 DIAGNOSIS — N186 End stage renal disease: Secondary | ICD-10-CM | POA: Diagnosis present

## 2020-11-30 DIAGNOSIS — K219 Gastro-esophageal reflux disease without esophagitis: Secondary | ICD-10-CM | POA: Diagnosis present

## 2020-11-30 DIAGNOSIS — J9811 Atelectasis: Secondary | ICD-10-CM | POA: Diagnosis not present

## 2020-11-30 DIAGNOSIS — J440 Chronic obstructive pulmonary disease with acute lower respiratory infection: Secondary | ICD-10-CM | POA: Diagnosis not present

## 2020-11-30 DIAGNOSIS — Z21 Asymptomatic human immunodeficiency virus [HIV] infection status: Secondary | ICD-10-CM | POA: Diagnosis present

## 2020-11-30 DIAGNOSIS — D631 Anemia in chronic kidney disease: Secondary | ICD-10-CM | POA: Diagnosis present

## 2020-11-30 DIAGNOSIS — G4730 Sleep apnea, unspecified: Secondary | ICD-10-CM | POA: Diagnosis present

## 2020-11-30 DIAGNOSIS — I132 Hypertensive heart and chronic kidney disease with heart failure and with stage 5 chronic kidney disease, or end stage renal disease: Secondary | ICD-10-CM | POA: Diagnosis not present

## 2020-11-30 DIAGNOSIS — Z8349 Family history of other endocrine, nutritional and metabolic diseases: Secondary | ICD-10-CM

## 2020-11-30 DIAGNOSIS — L89892 Pressure ulcer of other site, stage 2: Secondary | ICD-10-CM | POA: Clinically undetermined

## 2020-11-30 DIAGNOSIS — I340 Nonrheumatic mitral (valve) insufficiency: Secondary | ICD-10-CM | POA: Diagnosis present

## 2020-11-30 DIAGNOSIS — Z8249 Family history of ischemic heart disease and other diseases of the circulatory system: Secondary | ICD-10-CM

## 2020-11-30 DIAGNOSIS — Z888 Allergy status to other drugs, medicaments and biological substances status: Secondary | ICD-10-CM

## 2020-11-30 DIAGNOSIS — K3184 Gastroparesis: Secondary | ICD-10-CM | POA: Diagnosis present

## 2020-11-30 DIAGNOSIS — I10 Essential (primary) hypertension: Secondary | ICD-10-CM | POA: Diagnosis not present

## 2020-11-30 DIAGNOSIS — I5042 Chronic combined systolic (congestive) and diastolic (congestive) heart failure: Secondary | ICD-10-CM | POA: Diagnosis present

## 2020-11-30 DIAGNOSIS — R509 Fever, unspecified: Secondary | ICD-10-CM | POA: Diagnosis not present

## 2020-11-30 DIAGNOSIS — R110 Nausea: Secondary | ICD-10-CM | POA: Diagnosis not present

## 2020-11-30 LAB — CBC WITH DIFFERENTIAL/PLATELET
Abs Immature Granulocytes: 0.08 10*3/uL — ABNORMAL HIGH (ref 0.00–0.07)
Basophils Absolute: 0 10*3/uL (ref 0.0–0.1)
Basophils Relative: 0 %
Eosinophils Absolute: 0 10*3/uL (ref 0.0–0.5)
Eosinophils Relative: 0 %
HCT: 29.4 % — ABNORMAL LOW (ref 39.0–52.0)
Hemoglobin: 9.6 g/dL — ABNORMAL LOW (ref 13.0–17.0)
Immature Granulocytes: 1 %
Lymphocytes Relative: 5 %
Lymphs Abs: 0.7 10*3/uL (ref 0.7–4.0)
MCH: 35.4 pg — ABNORMAL HIGH (ref 26.0–34.0)
MCHC: 32.7 g/dL (ref 30.0–36.0)
MCV: 108.5 fL — ABNORMAL HIGH (ref 80.0–100.0)
Monocytes Absolute: 1.2 10*3/uL — ABNORMAL HIGH (ref 0.1–1.0)
Monocytes Relative: 9 %
Neutro Abs: 11.6 10*3/uL — ABNORMAL HIGH (ref 1.7–7.7)
Neutrophils Relative %: 85 %
Platelets: 220 10*3/uL (ref 150–400)
RBC: 2.71 MIL/uL — ABNORMAL LOW (ref 4.22–5.81)
RDW: 15.6 % — ABNORMAL HIGH (ref 11.5–15.5)
WBC: 13.6 10*3/uL — ABNORMAL HIGH (ref 4.0–10.5)
nRBC: 0 % (ref 0.0–0.2)

## 2020-11-30 LAB — COMPREHENSIVE METABOLIC PANEL
ALT: 25 U/L (ref 0–44)
AST: 23 U/L (ref 15–41)
Albumin: 3.1 g/dL — ABNORMAL LOW (ref 3.5–5.0)
Alkaline Phosphatase: 100 U/L (ref 38–126)
Anion gap: 17 — ABNORMAL HIGH (ref 5–15)
BUN: 11 mg/dL (ref 8–23)
CO2: 33 mmol/L — ABNORMAL HIGH (ref 22–32)
Calcium: 9 mg/dL (ref 8.9–10.3)
Chloride: 90 mmol/L — ABNORMAL LOW (ref 98–111)
Creatinine, Ser: 4.55 mg/dL — ABNORMAL HIGH (ref 0.61–1.24)
GFR, Estimated: 13 mL/min — ABNORMAL LOW (ref >60)
Glucose, Bld: 132 mg/dL — ABNORMAL HIGH (ref 70–99)
Potassium: 3.4 mmol/L — ABNORMAL LOW (ref 3.5–5.1)
Sodium: 140 mmol/L (ref 135–145)
Total Bilirubin: 0.7 mg/dL (ref 0.3–1.2)
Total Protein: 6.9 g/dL (ref 6.5–8.1)

## 2020-11-30 LAB — RESP PANEL BY RT-PCR (FLU A&B, COVID) ARPGX2
Influenza A by PCR: NEGATIVE
Influenza B by PCR: NEGATIVE
SARS Coronavirus 2 by RT PCR: NEGATIVE

## 2020-11-30 LAB — CBG MONITORING, ED: Glucose-Capillary: 121 mg/dL — ABNORMAL HIGH (ref 70–99)

## 2020-11-30 LAB — BRAIN NATRIURETIC PEPTIDE: B Natriuretic Peptide: 2788.5 pg/mL — ABNORMAL HIGH (ref 0.0–100.0)

## 2020-11-30 LAB — LACTIC ACID, PLASMA: Lactic Acid, Venous: 1.2 mmol/L (ref 0.5–1.9)

## 2020-11-30 MED ORDER — SODIUM CHLORIDE 0.9 % IV SOLN
1.0000 g | Freq: Once | INTRAVENOUS | Status: AC
  Administered 2020-11-30: 19:00:00 1 g via INTRAVENOUS
  Filled 2020-11-30: qty 10

## 2020-11-30 MED ORDER — ALLOPURINOL 100 MG PO TABS: 100.0000 mg | ORAL_TABLET | ORAL | Status: DC

## 2020-11-30 MED ORDER — BICTEGRAVIR-EMTRICITAB-TENOFOV 50-200-25 MG PO TABS
1.0000 | ORAL_TABLET | Freq: Every day | ORAL | Status: DC
  Administered 2020-12-01 – 2020-12-03 (×3): 1 via ORAL
  Filled 2020-11-30 (×4): qty 1

## 2020-11-30 MED ORDER — AMIODARONE HCL 200 MG PO TABS
200.0000 mg | ORAL_TABLET | Freq: Every day | ORAL | Status: DC
  Administered 2020-12-01 – 2020-12-03 (×2): 200 mg via ORAL
  Filled 2020-11-30 (×3): qty 1

## 2020-11-30 MED ORDER — RENA-VITE PO TABS
1.0000 | ORAL_TABLET | Freq: Every evening | ORAL | Status: DC
  Administered 2020-11-30 – 2020-12-02 (×3): 1 via ORAL
  Filled 2020-11-30 (×3): qty 1

## 2020-11-30 MED ORDER — MONTELUKAST SODIUM 10 MG PO TABS
10.0000 mg | ORAL_TABLET | Freq: Every day | ORAL | Status: DC
  Administered 2020-11-30 – 2020-12-02 (×3): 10 mg via ORAL
  Filled 2020-11-30 (×5): qty 1

## 2020-11-30 MED ORDER — ACETAMINOPHEN 325 MG PO TABS
650.0000 mg | ORAL_TABLET | Freq: Four times a day (QID) | ORAL | Status: DC | PRN
  Administered 2020-12-01: 650 mg via ORAL
  Filled 2020-11-30: qty 2

## 2020-11-30 MED ORDER — ONDANSETRON HCL 4 MG PO TABS: 4.0000 mg | ORAL_TABLET | Freq: Four times a day (QID) | ORAL | Status: DC | PRN

## 2020-11-30 MED ORDER — ONDANSETRON HCL 4 MG/2ML IJ SOLN: 4.0000 mg | Freq: Four times a day (QID) | INTRAMUSCULAR | Status: DC | PRN

## 2020-11-30 MED ORDER — SODIUM CHLORIDE 0.9 % IV SOLN
500.0000 mg | Freq: Once | INTRAVENOUS | Status: AC
  Administered 2020-11-30: 19:00:00 500 mg via INTRAVENOUS
  Filled 2020-11-30: qty 500

## 2020-11-30 MED ORDER — SODIUM CHLORIDE 0.9 % IV SOLN
1.0000 g | INTRAVENOUS | Status: DC
  Administered 2020-12-01 – 2020-12-02 (×2): 1 g via INTRAVENOUS
  Filled 2020-11-30 (×2): qty 10

## 2020-11-30 MED ORDER — OFLOXACIN 0.3 % OP SOLN
1.0000 [drp] | Freq: Four times a day (QID) | OPHTHALMIC | Status: DC
  Administered 2020-12-02: 21:00:00 1 [drp] via OPHTHALMIC
  Filled 2020-11-30 (×3): qty 5

## 2020-11-30 MED ORDER — POLYETHYLENE GLYCOL 3350 17 G PO PACK: 17.0000 g | PACK | Freq: Every day | ORAL | Status: DC | PRN

## 2020-11-30 MED ORDER — SUCROFERRIC OXYHYDROXIDE 500 MG PO CHEW
500.0000 mg | CHEWABLE_TABLET | Freq: Three times a day (TID) | ORAL | Status: DC
  Administered 2020-12-02 – 2020-12-03 (×4): 500 mg via ORAL
  Filled 2020-11-30 (×10): qty 1

## 2020-11-30 MED ORDER — APIXABAN 5 MG PO TABS
5.0000 mg | ORAL_TABLET | Freq: Two times a day (BID) | ORAL | Status: DC
  Administered 2020-12-01 – 2020-12-03 (×5): 5 mg via ORAL
  Filled 2020-11-30 (×5): qty 1

## 2020-11-30 MED ORDER — MIDODRINE HCL 5 MG PO TABS
10.0000 mg | ORAL_TABLET | ORAL | Status: DC
  Administered 2020-12-02 (×2): 10 mg via ORAL
  Filled 2020-11-30 (×2): qty 2

## 2020-11-30 MED ORDER — ACETAMINOPHEN 650 MG RE SUPP: 650.0000 mg | Freq: Four times a day (QID) | RECTAL | Status: DC | PRN

## 2020-11-30 MED ORDER — PANTOPRAZOLE SODIUM 40 MG PO TBEC
40.0000 mg | DELAYED_RELEASE_TABLET | Freq: Every day | ORAL | Status: DC
  Administered 2020-12-01 – 2020-12-03 (×3): 40 mg via ORAL
  Filled 2020-11-30 (×3): qty 1

## 2020-11-30 MED ORDER — ACETAMINOPHEN 325 MG PO TABS
650.0000 mg | ORAL_TABLET | Freq: Once | ORAL | Status: AC | PRN
  Administered 2020-11-30: 650 mg via ORAL
  Filled 2020-11-30: qty 2

## 2020-11-30 MED ORDER — DEXTROSE 5 % IV SOLN
250.0000 mg | INTRAVENOUS | Status: DC
  Administered 2020-12-01: 21:00:00 250 mg via INTRAVENOUS
  Filled 2020-11-30 (×3): qty 250

## 2020-11-30 MED ORDER — SODIUM CHLORIDE 0.9% FLUSH
3.0000 mL | Freq: Two times a day (BID) | INTRAVENOUS | Status: DC
  Administered 2020-11-30 – 2020-12-03 (×5): 3 mL via INTRAVENOUS

## 2020-11-30 MED ORDER — MIDODRINE HCL 5 MG PO TABS
5.0000 mg | ORAL_TABLET | ORAL | Status: DC
  Administered 2020-12-03: 5 mg via ORAL
  Filled 2020-11-30: qty 1

## 2020-11-30 NOTE — H&P (Signed)
History and Physical   [facility] DJM:[id number] DOB: 17-Dec-1940 DOA: 11/30/2020  PCP: [name], NP   Patient coming from: Home  Chief Complaint: Fatigue, vomiting, shortness of breath  HPI: [facility] David [name] is a 79 y.o. male with medical history significant of mixed systolic and diastolic heart failure, prostate cancer status post brachytherapy, HIV, A. fib, COPD, ESRD on HD TTS, GERD, gout, hyperlipidemia, hypertension, iron deficiency anemia, TIA, gastroparesis who presents with 1 to 2 days of fatigue shortness of breath and vomiting as above.  Patient has noted some fatigue and shortness of breath for at least 24 hours.  He had an episode of vomiting in the middle of night last night and again during the day today.  He went to his HD session today which he completed and states he attends and completes all his HD sessions.  After returning home he was unable to get out of the car due to his significant fatigue.  He does report dysuria for the past week. He denies fever, chills, cough, chest pain, constipation, diarrhea.  ED Course: Vital signs in the ED significant for fever to 101.7, respiratory rate initially in the 20s which is improving, heart rate initially 100s which has improved, BP in the 222L to 798X systolic.  Saturating well on room air.  Lab work-up showed BMP with potassium of 3.4, bicarb of 33 with gap of 17, creatinine of 4.5 which is stable, glucose of 132.  CBC showed hemoglobin of 9.6 which is similar to 6 months ago but less than his most recent labs.  Leukocytosis of 13.6.  BNP significantly elevated at 2788.  Lactic acid normal, troponin for flu and Covid negative.  Urinalysis and blood cultures are pending.  Chest x-ray showed evidence of volume overload with bibasilar disease likely atelectasis versus atypical infection.  CT abdomen pelvis was performed which showed no acute abnormality other than patchy lower lung atelectasis and choleliths without  cholelithiasis and presence of brachytherapy.  Patient was started on antibiotics in the ED.  Review of Systems: As per HPI otherwise all other systems reviewed and are negative.  Past Medical History:  Diagnosis Date  . 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 Acute on chronic systolic and diastolic heart failure, NYHA class 4 ([place])   . Anemia, iron deficiency 11/15/2011  . Arthritis    "hands, right knee, feet" (02/21/2018)  . Atrial fibrillation ([place])   . Cardiac arrest ([place]) 02/17/2018  . CHF (congestive heart failure) ([place])   . Chronic combined systolic and diastolic heart failure, NYHA class 3 ([place]) 06/2010   felt to be secondary to hypertensive cardiomyopathy  . Chronic lower back pain   . CKD (chronic kidney disease) stage V requiring chronic dialysis ([place])   . COVID-19 12/2019  . ESRD on dialysis [facility])    started 02/2018 [place]  . GERD (gastroesophageal reflux disease)   . Glaucoma    "I had surgery and dont have it any more"  . Gout    daily RX (02/21/2018)  . Heart murmur    "mild" per pt  . Hepatitis    years ago  . HIV (human immunodeficiency virus infection) ([place])   . HIV infection ([place])   . Hyperlipidemia   . Hypertension   . Nonischemic cardiomyopathy ([place])   . Pneumonia 11/2017  . Prostate cancer ([place])    hx of prostate; s/p radioactive seed implant 10/2009 Dr Janice Norrie  . Sinus bradycardia   . Sleep apnea  does not use a cpap  . Stroke [facility])    "mini stroke" years ago  . Wears glasses     Past Surgical History:  Procedure Laterality Date  . A/V FISTULAGRAM Bilateral 07/30/2020   Procedure: CENTRAL VENOGRAM;  Surgeon: Angelia Mould, MD;  Location: [place] CV [facility];  Service: Cardiovascular;  Laterality: Bilateral;  bilateral upper venogram   . AV FISTULA PLACEMENT Right 10/13/2016   Procedure: ARTERIOVENOUS (AV) FISTULA CREATION;  Surgeon: [name], MD;  Location: [place];  Service: Vascular;  Laterality: Right;  .  AV FISTULA PLACEMENT Left 02/25/2018   Procedure: INSERTION OF ARTERIOVENOUS (AV) GORE-TEX GRAFT LEFT UPPER ARM;  Surgeon: Angelia Mould, MD;  Location: [place];  Service: Vascular;  Laterality: Left;  . AV FISTULA PLACEMENT Right 08/07/2018   Procedure: Creation of right arm brachiocephalic Fistula;  Surgeon: Waynetta Sandy, MD;  Location: [place];  Service: Vascular;  Laterality: Right;  . AV FISTULA PLACEMENT Left 11/10/2019   Procedure: INSERTION OF ARTERIOVENOUS (AV) GORE-TEX GRAFT THIGH;  Surgeon: Elam Dutch, MD;  Location: [place];  Service: Vascular;  Laterality: Left;  . AV FISTULA PLACEMENT Right 12/02/2019   Procedure: INSERTION OF ARTERIOVENOUS (AV) GORE-TEX GRAFT RIGHT  THIGH;  Surgeon: Waynetta Sandy, MD;  Location: [place];  Service: Vascular;  Laterality: Right;  . AV FISTULA PLACEMENT Right 08/13/2020   Procedure: INSERTION OF RIGHT ARTERIOVENOUS (AV) GORE-TEX GRAFT ARM;  Surgeon: Angelia Mould, MD;  Location: [place];  Service: Vascular;  Laterality: Right;  . BASCILIC VEIN TRANSPOSITION Left 06/24/2015   Procedure: BASILIC VEIN TRANSPOSITION;  Surgeon: Mal Misty, MD;  Location: [place];  Service: Vascular;  Laterality: Left;  . BIOPSY  03/14/2019   Procedure: BIOPSY;  Surgeon: Irene Shipper, MD;  Location: [place];  Service: Endoscopy;;  . CATARACT EXTRACTION, BILATERAL Bilateral   . COLONOSCOPY WITH PROPOFOL N/A 03/14/2019   Procedure: COLONOSCOPY WITH PROPOFOL;  Surgeon: Irene Shipper, MD;  Location: [facility] ENDOSCOPY;  Service: Endoscopy;  Laterality: N/A;  . ESOPHAGOGASTRODUODENOSCOPY (EGD) WITH PROPOFOL N/A 05/05/2014   Procedure: ESOPHAGOGASTRODUODENOSCOPY (EGD) WITH PROPOFOL;  Surgeon: Missy Sabins, MD;  Location: [facility] ENDOSCOPY;  Service: Endoscopy;  Laterality: N/A;  . EYE SURGERY Bilateral    cataract surgery   . GLAUCOMA SURGERY Bilateral   . INSERTION OF ARTERIOVENOUS (AV) ARTEGRAFT ARM  10/09/2018   Procedure: INSERTION OF  ARTERIOVENOUS (AV) 22mm x 41cm ARTEGRAFT LEFT UPPER ARM;  Surgeon: Waynetta Sandy, MD;  Location: [place];  Service: Vascular;;  . INSERTION OF DIALYSIS CATHETER Right 07/14/2019   Procedure: Insertion Of Dialysis Catheter;  Surgeon: Elam Dutch, MD;  Location: [facility] OR;  Service: Vascular;  Laterality: Right;  placed right Internal Jugular  . INSERTION OF DIALYSIS CATHETER Left 08/13/2020   Procedure: INSERTION OF DIALYSIS CATHETER;  Surgeon: Angelia Mould, MD;  Location: [place];  Service: Vascular;  Laterality: Left;  . INSERTION PROSTATE RADIATION SEED    . IR FLUORO GUIDE CV LINE RIGHT  02/18/2018  . IR REMOVAL TUN CV CATH W/O FL  12/06/2018  . IR THROMBECTOMY AV FISTULA W/THROMBOLYSIS INC/SHUNT/IMG RIGHT  11/12/2020  . IR THROMBECTOMY AV FISTULA W/THROMBOLYSIS/PTA INC/SHUNT/IMG RIGHT Right 01/26/2020  . IR THROMBECTOMY AV FISTULA W/THROMBOLYSIS/PTA INC/SHUNT/IMG RIGHT Right 02/24/2020  . IR THROMBECTOMY AV FISTULA W/THROMBOLYSIS/PTA INC/SHUNT/IMG RIGHT Right 04/07/2020  . IR US GUIDE VASC ACCESS RIGHT  02/18/2018  . IR US GUIDE VASC ACCESS RIGHT  01/26/2020  .  IR US GUIDE VASC ACCESS RIGHT  04/07/2020  . IR US GUIDE VASC ACCESS RIGHT  11/12/2020  . LEFT HEART CATH AND CORONARY ANGIOGRAPHY N/A 02/20/2018   Procedure: LEFT HEART CATH AND CORONARY ANGIOGRAPHY;  Surgeon: Jettie Booze, MD;  Location: [place] CV LAB;;; 50% ostial OM2, 25% mLAD.  Marland Kitchen LEFT HEART CATH AND CORONARY ANGIOGRAPHY  2004   mildly depressed LV systolic fx EF 67%,EHMCNO coronaries/abdominal aorta/renal arteries.  . LOWER EXTREMITY ANGIOGRAM Left 11/17/2019   Procedure: Lower Extremity Fistulogram;  Surgeon: Marty Heck, MD;  Location: [place];  Service: Vascular;  Laterality: Left;  . NM [place]  02/21/2010   No ischemia or infarction.  EF 27%.  Marland Kitchen RADIOACTIVE SEED IMPLANT  2010   prostate cancer  . REMOVAL OF A DIALYSIS CATHETER Right 08/13/2020   Procedure: REMOVAL OF A DIALYSIS  CATHETER;  Surgeon: Angelia Mould, MD;  Location[facility] OR;  Service: Vascular;  Laterality: Right;  . REVISION OF ARTERIOVENOUS GORETEX GRAFT Right 07/11/2019   Procedure: REVISION OF ARTERIOVENOUS GORETEX GRAFT RIGHT ARM WITH ARTEGRAFT;  Surgeon: Serafina Mitchell, MD;  Location: [place];  Service: Vascular;  Laterality: Right;  . SHUNTOGRAM Right 07/14/2019   Procedure: Shuntogram;  Surgeon: Elam Dutch, MD;  Location: [place];  Service: Vascular;  Laterality: Right;  . THROMBECTOMY AND REVISION OF ARTERIOVENTOUS (AV) GORETEX  GRAFT Right 07/14/2019   Procedure: THROMBECTOMY AND REVISION OF ARTERIOVENTOUS (AV) GORETEX  GRAFT;  Surgeon: Elam Dutch, MD;  Location: [place];  Service: Vascular;  Laterality: Right;  . THROMBECTOMY W/ EMBOLECTOMY Left 02/26/2018   Procedure: THROMBECTOMY ARTERIOVENOUS GRAFT;  Surgeon: Angelia Mould, MD;  Location: [place];  Service: Vascular;  Laterality: Left;  . THROMBECTOMY W/ EMBOLECTOMY Left 11/17/2019   Procedure: THROMBECTOMY ARTERIOVENOUS GRAFT LEFT THIGH;  Surgeon: Marty Heck, MD;  Location: [place];  Service: Vascular;  Laterality: Left;  . TRANSTHORACIC ECHOCARDIOGRAM  02/2012   [facility]) EF 45-50% with mild global HK.  Mild LVH,LA mod. dilated,mild-mod. 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 AI.  Marland Kitchen TRANSTHORACIC ECHOCARDIOGRAM  9/'18; 3/'19   a) Severely reduced EF.  25 from 30%.  GR 1 DD with diffuse ecchymosis. Mild AS & AI.  Mild LA/RA dilation; b) (post PEA arest):   Moderately dilated LV with moderate concentric virtually.  EF 15 to 20%.  GR 1 DD.  Paradoxical septal motion. Mild AI.  Mild LA dilation.  Poorly visualized RV.  Marland Kitchen TRANSTHORACIC ECHOCARDIOGRAM  02/2019   EF 20-25%.  Unable to assess diastolic function (A. Fib).  No LV apical thrombus.  Mild AI.  Mildly reduced RV function, however poorly visualized.  Marland Kitchen ULTRASOUND GUIDANCE FOR VASCULAR ACCESS  02/20/2018   Procedure: Ultrasound Guidance For  Vascular Access;  Surgeon: Jettie Booze, MD;  Location: [place] CV LAB;  Service: Cardiovascular;;  . UPPER EXTREMITY VENOGRAPHY N/A 07/08/2018   Procedure: UPPER EXTREMITY VENOGRAPHY;  Surgeon: Waynetta Sandy, MD;  Location: [place] [facility] LAB;  Service: Cardiovascular;  Laterality: N/A;  Bilateral    Social History  reports that he quit smoking about 16 years ago. His smoking use included cigarettes. He has a 30.00 pack-year smoking history. He has never used smokeless tobacco. He reports current alcohol use of about 1.0 standard drink of alcohol per week. He reports that he does not use drugs.  Allergies  Allergen Reactions  . Dextromethorphan-Guaifenesin Other (See Comments)    Unknown  reaction  . Tocotrienols Other (See Comments)    Unknown reaction  . Losartan Potassium Other (See Comments)    Causes constipation    Family History  Problem Relation Age of Onset  . Hypertension Mother   . Thyroid disease Mother   . Cholelithiasis Daughter   . Cholelithiasis Son   . Hypertension Maternal Grandmother   . Diabetes Maternal Grandmother   . Heart attack Neg Hx   . Hyperlipidemia Neg Hx   Reviewed on admission  Prior to Admission medications   Medication Sig Start Date End Date Taking? Authorizing Provider  acetaminophen (TYLENOL) 500 MG tablet Take 1,000 mg by mouth every 6 (six) hours as needed for moderate pain.   Yes [provider]  allopurinol (ZYLOPRIM) 100 MG tablet TAKE 1 TABLET BY MOUTH 4 TIMES WEEKLY( MONDAY, [place]) AFTER DIALYSIS Patient taking differently: Take 100 mg by mouth See admin instructions. TAKE 1 TABLET BY MOUTH 4 TIMES WEEKLY( MONDAY, TUESDAY,[place] AND SATURDAY) AFTER DIALYSIS 10/20/20  Yes Croitoru, Mihai, MD  amiodarone (PACERONE) 200 MG tablet TAKE 1 TABLET(200 MG) BY MOUTH DAILY Patient taking differently: Take 200 mg by mouth daily. 10/20/20  Yes Croitoru, Mihai, MD  apixaban (ELIQUIS) 5 MG TABS  tablet Take 1 tablet (5 mg total) by mouth 2 (two) times daily. 06/24/20  Yes Croitoru, Mihai, MD  [name] 50-200-25 MG TABS tablet TAKE 1 TABLET BY MOUTH DAILY 09/14/20  Yes Tommy Medal, Lavell Islam, MD  lidocaine-prilocaine (EMLA) cream Apply 1 application topically See admin instructions. Use on dialysis days 09/06/20  Yes [provider]  midodrine (PROAMATINE) 10 MG tablet Take 1 tablet (10 mg total) by mouth as directed. Twice a day on the day of HD Tuesday, Thursday, Saturday (1 in AM and 1 at [place]). Take 1/2 tablet (5mg ) a day on non HD days Patient taking differently: Take 10-20 mg by mouth See admin instructions. Take 10 mg in the morning at 5 AM. take 10 mg at 6AM of dialysis days and 10 mg half way through treatment on dialysis days (Mon, Tues, [place], and Sat) 04/22/19  Yes Leonie Man, MD  montelukast (SINGULAIR) 10 MG tablet Take 1 tablet (10 mg total) by mouth at bedtime. 11/03/20 12/03/20 Yes [name], NP  multivitamin (RENA-VIT) TABS tablet Take 1 tablet by mouth every evening. 05/26/20  Yes [provider]  ofloxacin (OCUFLOX) 0.3 % ophthalmic solution Place 1 drop into the right eye 4 (four) times daily. 06/23/20  Yes [provider]  pantoprazole (PROTONIX) 40 MG tablet Take 1 tablet (40 mg total) by mouth daily. 11/25/20  Yes Wendling, Crosby Oyster, DO  polyethylene glycol (MIRALAX / GLYCOLAX) 17 g packet Take 17 g by mouth daily as needed for mild constipation or moderate constipation.    Yes [provider]  sucroferric oxyhydroxide (VELPHORO) 500 MG chewable tablet Chew 500 mg by mouth 3 (three) times daily with meals.   Yes [provider]  heparin 1000 unit/mL SOLN injection Heparin Sodium (Porcine) 1,000 Units/mL Catheter Lock Arterial 08/21/20 08/20/21  [provider]  oxyCODONE (ROXICODONE) 5 MG immediate release tablet Take 1 tablet (5 mg total) by mouth every 4 (four) hours as needed. Patient taking differently:  Take 5 mg by mouth every 4 (four) hours as needed for moderate pain. 08/13/20   Angelia Mould, MD    Physical Exam: Vitals:   11/30/20 1830 11/30/20 1845 11/30/20 1900 11/30/20 1915  BP: (!) 111/56 113/61 105/63 104/65  Pulse:  78 78    Resp: 20 20 19  (!) 24  Temp:      TempSrc:      SpO2: 100% 100%    Weight:      Height:       Physical Exam Constitutional:      General: He is not in acute distress.    Appearance: Normal appearance.  HENT:     Head: Normocephalic and atraumatic.     Mouth/Throat:     Mouth: Mucous membranes are moist.     Pharynx: Oropharynx is clear.  Eyes:     Extraocular Movements: Extraocular movements intact.     Pupils: Pupils are equal, round, and reactive to light.  Cardiovascular:     Rate and Rhythm: Normal rate and regular rhythm.     Pulses: Normal pulses.     Heart sounds: Normal heart sounds.  Pulmonary:     Effort: No respiratory distress.     Comments: Tachypnea Abdominal:     General: Bowel sounds are normal. There is no distension.     Palpations: Abdomen is soft.     Tenderness: There is no abdominal tenderness.  Musculoskeletal:        General: No swelling or deformity.  Skin:    General: Skin is warm and dry.  Neurological:     General: No focal deficit present.     Mental Status: Mental status is at baseline.    Labs on Admission: I have personally reviewed following labs and imaging studies  CBC: Recent Labs  Lab 11/30/20 1430  WBC 13.6*  NEUTROABS 11.6*  HGB 9.6*  HCT 29.4*  MCV 108.5*  PLT 616    Basic Metabolic Panel: Recent Labs  Lab 11/30/20 1430  NA 140  K 3.4*  CL 90*  CO2 33*  GLUCOSE 132*  BUN 11  CREATININE 4.55*  CALCIUM 9.0    GFR: Estimated Creatinine Clearance: 15.6 mL/min (A) (by C-G formula based on SCr of 4.55 mg/dL (H)).  Liver Function Tests: Recent Labs  Lab 11/30/20 1430  AST 23  ALT 25  ALKPHOS 100  BILITOT 0.7  PROT 6.9  ALBUMIN 3.1*    Urine analysis:     Component Value Date/Time   COLORURINE YELLOW 02/17/2018 0540   APPEARANCEUR CLOUDY (A) 02/17/2018 0540   LABSPEC 1.010 02/17/2018 0540   PHURINE 5.0 02/17/2018 0540   GLUCOSEU NEGATIVE 02/17/2018 0540   HGBUR LARGE (A) 02/17/2018 0540   BILIRUBINUR NEGATIVE 02/17/2018 0540   BILIRUBINUR negative 04/16/2013 1447   [place] 02/17/2018 0540   PROTEINUR 100 (A) 02/17/2018 0540   UROBILINOGEN 0.2 05/11/2014 1352   NITRITE NEGATIVE 02/17/2018 0540   LEUKOCYTESUR NEGATIVE 02/17/2018 0540    Radiological Exams on Admission: CT ABDOMEN PELVIS WO CONTRAST  Result Date: 11/30/2020 CLINICAL DATA:  Abdominal pain, fever, vomiting, weakness. Status post dialysis today. EXAM: CT ABDOMEN AND PELVIS WITHOUT CONTRAST TECHNIQUE: Multidetector CT imaging of the abdomen and pelvis was performed following the standard protocol without IV contrast. COMPARISON:  MRI abdomen dated 05/15/2020. CT abdomen/pelvis dated 01/25/2020. FINDINGS: Lower chest: Mild patchy bilateral lower lobe opacities, atelectasis versus pneumonia. Small bilateral pleural effusions. Cardiomegaly. Hepatobiliary: Unenhanced liver is unremarkable. Mild cholelithiasis in the gallbladder fundus (series 3/image 37), without associated inflammatory changes. No intrahepatic or extrahepatic ductal dilatation. Pancreas: Within normal limits. Spleen: Within normal limits. Adrenals/Urinary Tract: Adrenal glands are within normal limits. Bilateral renal cortical atrophy. Multiple bilateral renal lesions, some of which are hyperdense, measuring up to 1.6  cm in the lateral interpolar left kidney (series 3/image 33) and 2.0 cm in the posterior right lower kidney (series 3/image 37). These are unchanged from the prior and favor benign hemorrhagic cysts, but are poorly characterized. Bladder is decompressed. Stomach/Bowel: Stomach is within normal limits. No evidence of bowel obstruction. Normal appendix (series 3/image 59). Vascular/Lymphatic: No  evidence of abdominal aortic aneurysm. Atherosclerotic calcifications of the abdominal aorta and branch vessels. No suspicious abdominopelvic lymphadenopathy. Reproductive: Brachytherapy seeds in the prostate. Other: No abdominopelvic ascites. Musculoskeletal: Mild degenerative changes of the lumbar spine. Moderate degenerative changes of the left hip. No focal osseous lesions. IMPRESSION: No CT findings to account for the patient's abdominal pain. Mild patchy bilateral lower lobe opacities, atelectasis versus pneumonia. Small bilateral pleural effusions. Cholelithiasis, without associated inflammatory changes. Brachytherapy seeds in the prostate. No findings suspicious for metastatic disease. Electronically Signed   By: Julian Hy M.D.   On: 11/30/2020 16:57   DG Chest Port 1 View  Result Date: 11/30/2020 CLINICAL DATA:  Fever. EXAM: PORTABLE CHEST 1 VIEW COMPARISON:  August 13, 2020 FINDINGS: Multiple leads project over the patient's chest. LEFT-sided dialysis catheter terminates at the caval to atrial junction as before. Heart size is enlarged with signs of central pulmonary vascular congestion and increased interstitial markings. Basilar airspace disease bilaterally. Graded opacity along the inferior LEFT and RIGHT chest. On limited assessment no acute skeletal process. IMPRESSION: 1. Findings most suggestive of volume overload and or heart failure. 2. Basilar airspace disease likely atelectasis. 3. Pattern of disease in the chest could also be seen in the setting of atypical infection with some patchy areas of opacity at the bases bilaterally. Electronically Signed   By: Zetta Bills M.D.   On: 11/30/2020 14:33    EKG: Independently reviewed.  Sinus tachycardia at 107 bpm.  Ventricular conduction delay, likely left bundle branch block, possible LVH.  Assessment/Plan Principal Problem:   Volume overload Active Problems:   Iron deficiency anemia   Hemodialysis-associated hypotension    Gastroparesis   Acute on chronic systolic heart failure (HCC)   HIV (human immunodeficiency virus infection) (HCC)   ESRD (end stage renal disease) (HCC)   Atrial fibrillation, chronic (HCC)   CAD (coronary artery disease)   Gastroesophageal reflux disease   Gout   Chronic combined systolic and diastolic heart failure, NYHA class 3 (HCC)   Volume overload Acute on chronic systolic and diastolic CHF > Patient with 5 days of shortness of breath including following his HD session today > Chest x-ray with volume overload and BNP significant elevated to 2788 > Not on oxygen in ED > Does make minimal urine but volume is managed with dialysis > Does have history of soft blood pressures and is on midodrine on HD days - Consult nephrology in the morning - Soft BP, stable 0 to 2 L, minimal urinary output, will hold off on diuretics - Trend renal function and electrolytes - Echocardiogram - Strict I/O, daily weights  Leukocytosis ?Sepsis UTI versus Pneumonia > WBC of 13.6.  Chest x-ray with possible infiltrate in lower lobes.  Urine studies and blood cultures pending.  Does report dysuria this week which is new. - Continue ceftriaxone and azithromycin for now - Follow-up urine studies and blood cultures - Add urine culture  Volume overload End-stage renal disease > Patient with 5 days of shortness of breath including following his HD session today > Chest x-ray with volume overload and BNP significant elevated to 2788 - Trend renal function electrolytes  Nausea vomiting History of gastroparesis > Patient with couple episodes of nausea vomiting recently, nothing on CT but does have history of gastroparesis - As needed antiemetics  Iron deficiency anemia ?Anemia of ESRD > Hemoglobin 9.6 in ED, this is stable from 6 months ago that there have been some values in the meantime that were higher in the 10-12 range unclear what his true baseline. - Trend hemoglobin  Atrial  fibrillation > Rate initially elevated but improved in ED - Continue amiodarone and Eliquis  CAD > Non-obstructive on 2019 Cath  Gout - Continue allopurinol on dialysis days  GERD - Continue PPI  HIV > Last CD4 and viral load was in July which were 469 and 21 respectively - Continue [name]   DVT prophylaxis: Eliquis  Code Status:   Full Family Communication:  None on admission Disposition Plan:   Patient is from:  Home  Anticipated DC to:  Home  Anticipated DC date:  Pending clinical course  Anticipated DC barriers: None  Consults called:  None, will need nephrology in the morning  Admission status:  Inpatient, telemetry  Severity of Illness: The appropriate patient status for this patient is INPATIENT. Inpatient status is judged to be reasonable and necessary in order to provide the required intensity of service to ensure the patient's safety. The patient's presenting symptoms, physical exam findings, and initial radiographic and laboratory data in the context of their chronic comorbidities is felt to place them at high risk for further clinical deterioration. Furthermore, it is not anticipated that the patient will be medically stable for discharge from the hospital within 2 midnights of admission. The following factors support the patient status of inpatient.   " The patient's presenting symptoms include shortness of breath, nausea, fatigue. " The worrisome physical exam findings include trace tachypnea. " The initial radiographic and laboratory data are worrisome because of volume overload on chest x-ray with possible infiltrate. " The chronic co-morbidities include heart failure, prostate cancer, HIV, A. fib, COPD, ESRD, GERD, gout, hypertension, hyperlipidemia, iron deficiency anemia, TIA, gastroparesis.   * I certify that at the point of admission it is my clinical judgment that the patient will require inpatient hospital care spanning beyond 2 midnights from the point  of admission due to high intensity of service, high risk for further deterioration and high frequency of surveillance required.Marcelyn Bruins MD [facility]  How to contact the [facility] Attending or Consulting provider [place] or covering provider during after hours [place], for this patient?   1. Check the care team in [facility] and look for a) attending/consulting TRH provider listed and b) the [facility] team listed 2. Log into [email or web address] and use [place]'s universal password to access. If you do not have the password, please contact the hospital operator. 3. Locate the [name] provider you are looking for under [facility] and page to a number that you can be directly reached. 4. If you still have difficulty reaching the provider, please page the [facility] (Director on Call) for the Hospitalists listed on amion for assistance.  11/30/2020, 7:51 PM

## 2020-11-30 NOTE — ED Notes (Signed)
EDP gave this RN verbal permission that one blood culture is sufficient. IV ABTs will be hung at this time.

## 2020-11-30 NOTE — ED Notes (Signed)
Pt placed on condom catheter. 

## 2020-11-30 NOTE — ED Provider Notes (Addendum)
[facility] EMERGENCY DEPARTMENT Provider Note   CSN: [id number] Arrival date & time: 11/30/20  1241     History No chief complaint on file.   [facility] Nicolini is a 79 y.o. 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  Evidently he was able to complete dialysis.  On my examination, patient reports that he has been feeling weak for the past 24 hours.  Patient reports that he has been feeling particularly dyspneic with even minimal exertion.  He denies any chest pain.  He states that at 3 AM he woke up in the middle the night and vomited last night's dinner.  Patient has to use 4 pillows at night.  He states that he only has a mild cough, just to clear the phlegm.  He then had another episode of emesis earlier today along with diminished appetite.  Patient reports that he was able to complete his dialysis prior to calling EMS from home.  He states that he is still able to make urine, but not much.  His last bowel movement was yesterday and he reports it was his normal.  He takes iron supplements.  Patient does have a bulge in the right side of his groin, appears suspicious for inguinal hernia.  He has been on dialysis for 3-4 years.  Makes very small amounts of urine.    HPI     Past Medical History:  Diagnosis Date  . 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 Acute on chronic systolic and diastolic heart failure, NYHA class 4 ([place])   . Anemia, iron deficiency 11/15/2011  . Arthritis    "hands, right knee, feet" (02/21/2018)  . Atrial fibrillation ([place])   . Cardiac arrest ([place]) 02/17/2018  . CHF (congestive heart failure) ([place])   . Chronic combined systolic and diastolic heart failure, NYHA class 3 ([place]) 06/2010   felt to be secondary to hypertensive  cardiomyopathy  . Chronic lower back pain   . CKD (chronic kidney disease) stage V requiring chronic dialysis ([place])   . COVID-19 12/2019  . ESRD on dialysis [facility])    started 02/2018 [place]  . GERD (gastroesophageal reflux disease)   . Glaucoma    "I had surgery and dont have it any more"  . Gout    daily RX (02/21/2018)  . Heart murmur    "mild" per pt  . Hepatitis    years ago  . HIV (human immunodeficiency virus infection) ([place])   . HIV infection ([place])   . Hyperlipidemia   . Hypertension   . Nonischemic cardiomyopathy ([place])   . Pneumonia 11/2017  . Prostate cancer ([place])    hx of prostate; s/p radioactive seed implant 10/2009 Dr Janice Norrie  . Sinus bradycardia   . Sleep apnea    does not use a cpap  . Stroke [facility])    "mini stroke" years ago  . Wears glasses     Patient Active Problem List   Diagnosis Date Noted  . Complication of vascular dialysis catheter 09/29/2020  . Disease of gallbladder, unspecified 05/18/2020  . Elevated LFTs   . Abdominal pain   . Transaminitis 05/15/2020  . Encounter for removal of sutures 04/10/2020  . Full code status 02/03/2020  . Chest  pain 01/24/2020  . Subtherapeutic international normalized ratio (INR) 01/24/2020  . Elevated troponin 01/24/2020  . Pressure injury of skin 01/07/2020  . Combined systolic and diastolic heart failure ([place])   . ESRD on dialysis ([place])   . COVID-19 12/11/2019  . Acute respiratory failure with hypoxemia ([place]) 11/01/2019  . Closed fracture of nasal bones 09/10/2019  . Hyperkalemia 09/02/2019  . Unspecified open wound of right upper arm, initial encounter 06/21/2019  . Shortness of breath 04/12/2019  . 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 Dependence on renal dialysis ([place]) 03/27/2019  . Hypertensive heart and chronic kidney disease with heart failure and with stage 5 chronic kidney disease, or end stage renal disease ([place]) 03/27/2019  . Mesenteric ischemia ([place])   . Colonic ulcer    . Right lower quadrant pain   . Abnormal CT of the abdomen   . Ischemic colitis ([place])   . Colitis presumed infectious 03/09/2019  . Acute respiratory failure with hypoxia and hypercapnia ([place]) 03/04/2019  . Pruritus, unspecified 02/21/2019  . Acute pulmonary edema ([place]) 02/11/2019  . Pain, unspecified 01/21/2019  . Paroxysmal atrial fibrillation ([place]) 11/16/2018  . LBBB (left bundle branch block) 11/16/2018  . Encounter for monitoring amiodarone therapy 11/16/2018  . Volume overload 11/01/2018  . Pulmonary edema 10/21/2018  . Gout 10/21/2018  . Hypertensive emergency 10/21/2018  . Chronic combined systolic and diastolic heart failure, NYHA class 3 ([place]) 10/21/2018  . Leukocytosis 10/21/2018  . Chills (without fever) 09/24/2018  . Gastroesophageal reflux disease 08/28/2018  . CAD (coronary artery disease) 04/03/2018  . Palliative care encounter 03/27/2018  . Atrial fibrillation, chronic ([place]) 03/25/2018  . Anemia in chronic kidney disease 03/12/2018  . Coagulation defect, unspecified ([place]) 03/12/2018  . Secondary hyperparathyroidism of renal origin ([place]) 03/12/2018  . Palliative care by specialist   . Advance care planning   . Goals of care, counseling/discussion   . Respiratory arrest ([place])   . Acute on chronic systolic heart failure ([place])   . Chronic obstructive pulmonary disease ([place])   . HIV (human immunodeficiency virus infection) ([place])   . ESRD (end stage renal disease) ([place])   . Acute respiratory failure with hypoxia ([place]) 01/21/2018  . Pneumonia   . Aspiration pneumonia of both lower lobes due to regurgitated food ([place])   . Sepsis due to pneumonia ([place]) 11/22/2017  . AIDS ([place]) 05/14/2014  . 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 Gastroparesis 05/06/2014  . Protein-calorie malnutrition, severe ([place]) 05/05/2014  . ESRD on hemodialysis ([place]) 05/02/2014  . Hemodialysis-associated hypotension 05/02/2014  . Leaking of conjunctival drainage bleb 05/01/2014  . POAG (primary  open-angle glaucoma) 05/01/2014  . Loss of weight 04/29/2014  . Conjunctivitis 04/29/2014  . 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 Low back pain 04/09/2012  . Osteoarthritis of hip 12/29/2011  . Iron deficiency anemia 06/28/2011  . Macrocytic anemia 04/02/2011  . 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 Hyperlipidemia 11/30/2010  . Transient cerebral ischemia 11/30/2010  . HYPERGLYCEMIA 11/30/2010    Past Surgical History:  Procedure Laterality Date  . A/V FISTULAGRAM Bilateral 07/30/2020   Procedure: CENTRAL VENOGRAM;  Surgeon: Angelia Mould, MD;  Location: [facility] CV LAB;  Service: Cardiovascular;  Laterality: Bilateral;  bilateral upper venogram   . AV FISTULA PLACEMENT Right 10/13/2016   Procedure: ARTERIOVENOUS (AV) FISTULA CREATION;  Surgeon: [name], MD;  Location: [facility] OR;  Service: Vascular;  Laterality: Right;  . AV FISTULA PLACEMENT Left 02/25/2018   Procedure: INSERTION OF ARTERIOVENOUS (AV) GORE-TEX  GRAFT LEFT UPPER ARM;  Surgeon: Angelia Mould, MD;  Location: [facility];  Service: Vascular;  Laterality: Left;  . AV FISTULA PLACEMENT Right 08/07/2018   Procedure: Creation of right arm brachiocephalic Fistula;  Surgeon: Waynetta Sandy, MD;  Location: [facility];  Service: Vascular;  Laterality: Right;  . AV FISTULA PLACEMENT Left 11/10/2019   Procedure: INSERTION OF ARTERIOVENOUS (AV) GORE-TEX GRAFT THIGH;  Surgeon: Elam Dutch, MD;  Location: [facility];  Service: Vascular;  Laterality: Left;  . AV FISTULA PLACEMENT Right 12/02/2019   Procedure: INSERTION OF ARTERIOVENOUS (AV) GORE-TEX GRAFT RIGHT  THIGH;  Surgeon: Waynetta Sandy, MD;  Location: [place];  Service: Vascular;  Laterality: Right;  . AV FISTULA PLACEMENT Right 08/13/2020   Procedure: INSERTION OF RIGHT ARTERIOVENOUS (AV) GORE-TEX GRAFT ARM;  Surgeon: Angelia Mould, MD;  Location: [place];  Service: Vascular;  Laterality: Right;  . BASCILIC  VEIN TRANSPOSITION Left 06/24/2015   Procedure: BASILIC VEIN TRANSPOSITION;  Surgeon: Mal Misty, MD;  Location: [place] [facility];  Service: Vascular;  Laterality: Left;  . BIOPSY  03/14/2019   Procedure: BIOPSY;  Surgeon: Irene Shipper, MD;  Location: [facility];  Service: Endoscopy;;  . CATARACT EXTRACTION, BILATERAL Bilateral   . COLONOSCOPY WITH PROPOFOL N/A 03/14/2019   Procedure: COLONOSCOPY WITH PROPOFOL;  Surgeon: Irene Shipper, MD;  Location: [facility] ENDOSCOPY;  Service: Endoscopy;  Laterality: N/A;  . ESOPHAGOGASTRODUODENOSCOPY (EGD) WITH PROPOFOL N/A 05/05/2014   Procedure: ESOPHAGOGASTRODUODENOSCOPY (EGD) WITH PROPOFOL;  Surgeon: Missy Sabins, MD;  Location: [facility] ENDOSCOPY;  Service: Endoscopy;  Laterality: N/A;  . EYE SURGERY Bilateral    cataract surgery   . GLAUCOMA SURGERY Bilateral   . INSERTION OF ARTERIOVENOUS (AV) ARTEGRAFT ARM  10/09/2018   Procedure: INSERTION OF ARTERIOVENOUS (AV) 48mm x 41cm ARTEGRAFT LEFT UPPER ARM;  Surgeon: Waynetta Sandy, MD;  Location: [facility];  Service: Vascular;;  . INSERTION OF DIALYSIS CATHETER Right 07/14/2019   Procedure: Insertion Of Dialysis Catheter;  Surgeon: Elam Dutch, MD;  Location: [facility] OR;  Service: Vascular;  Laterality: Right;  placed right Internal Jugular  . INSERTION OF DIALYSIS CATHETER Left 08/13/2020   Procedure: INSERTION OF DIALYSIS CATHETER;  Surgeon: Angelia Mould, MD;  Location: [facility];  Service: Vascular;  Laterality: Left;  . INSERTION PROSTATE RADIATION SEED    . IR FLUORO GUIDE CV LINE RIGHT  02/18/2018  . IR REMOVAL TUN CV CATH W/O FL  12/06/2018  . IR THROMBECTOMY AV FISTULA W/THROMBOLYSIS INC/SHUNT/IMG RIGHT  11/12/2020  . IR THROMBECTOMY AV FISTULA W/THROMBOLYSIS/PTA INC/SHUNT/IMG RIGHT Right 01/26/2020  . IR THROMBECTOMY AV FISTULA W/THROMBOLYSIS/PTA INC/SHUNT/IMG RIGHT Right 02/24/2020  . IR THROMBECTOMY AV FISTULA W/THROMBOLYSIS/PTA INC/SHUNT/IMG RIGHT Right 04/07/2020  . IR US GUIDE VASC ACCESS RIGHT  02/18/2018   . IR US GUIDE VASC ACCESS RIGHT  01/26/2020  . IR US GUIDE VASC ACCESS RIGHT  04/07/2020  . IR US GUIDE VASC ACCESS RIGHT  11/12/2020  . LEFT HEART CATH AND CORONARY ANGIOGRAPHY N/A 02/20/2018   Procedure: LEFT HEART CATH AND CORONARY ANGIOGRAPHY;  Surgeon: Jettie Booze, MD;  Location: [facility] CV LAB;;; 50% ostial OM2, 25% mLAD.  Marland Kitchen LEFT HEART CATH AND CORONARY ANGIOGRAPHY  2004   mildly depressed LV systolic fx EF 36%,[id number] coronaries/abdominal aorta/renal arteries.  . LOWER EXTREMITY ANGIOGRAM Left 11/17/2019   Procedure: Lower Extremity Fistulogram;  Surgeon: Marty Heck, MD;  Location: [place] [facility];  Service: Vascular;  Laterality: Left;  . NM MYOCAR PERF  WALL MOTION  02/21/2010   No ischemia or infarction.  EF 27%.  Marland Kitchen RADIOACTIVE SEED IMPLANT  2010   prostate cancer  . REMOVAL OF A DIALYSIS CATHETER Right 08/13/2020   Procedure: REMOVAL OF A DIALYSIS CATHETER;  Surgeon: Angelia Mould, MD;  Location: [facility] OR;  Service: Vascular;  Laterality: Right;  . REVISION OF ARTERIOVENOUS GORETEX GRAFT Right 07/11/2019   Procedure: REVISION OF ARTERIOVENOUS GORETEX GRAFT RIGHT ARM WITH ARTEGRAFT;  Surgeon: Serafina Mitchell, MD;  Location: [place];  Service: Vascular;  Laterality: Right;  . SHUNTOGRAM Right 07/14/2019   Procedure: Shuntogram;  Surgeon: Elam Dutch, MD;  Location: [place];  Service: Vascular;  Laterality: Right;  . THROMBECTOMY AND REVISION OF ARTERIOVENTOUS (AV) GORETEX  GRAFT Right 07/14/2019   Procedure: THROMBECTOMY AND REVISION OF ARTERIOVENTOUS (AV) GORETEX  GRAFT;  Surgeon: Elam Dutch, MD;  Location: [place];  Service: Vascular;  Laterality: Right;  . THROMBECTOMY W/ EMBOLECTOMY Left 02/26/2018   Procedure: THROMBECTOMY ARTERIOVENOUS GRAFT;  Surgeon: Angelia Mould, MD;  Location: [place];  Service: Vascular;  Laterality: Left;  . THROMBECTOMY W/ EMBOLECTOMY Left 11/17/2019   Procedure: THROMBECTOMY ARTERIOVENOUS GRAFT LEFT THIGH;  Surgeon: Marty Heck, MD;  Location: [place];  Service: Vascular;  Laterality: Left;  . TRANSTHORACIC ECHOCARDIOGRAM  02/2012   [facility]) EF 45-50% with mild global HK.  Mild LVH,LA mod. dilated,mild-mod. 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 AI.  Marland Kitchen TRANSTHORACIC ECHOCARDIOGRAM  9/'18; 3/'19   a) Severely reduced EF.  25 from 30%.  GR 1 DD with diffuse ecchymosis. Mild AS & AI.  Mild LA/RA dilation; b) (post PEA arest):   Moderately dilated LV with moderate concentric virtually.  EF 15 to 20%.  GR 1 DD.  Paradoxical septal motion. Mild AI.  Mild LA dilation.  Poorly visualized RV.  Marland Kitchen TRANSTHORACIC ECHOCARDIOGRAM  02/2019   EF 20-25%.  Unable to assess diastolic function (A. Fib).  No LV apical thrombus.  Mild AI.  Mildly reduced RV function, however poorly visualized.  Marland Kitchen ULTRASOUND GUIDANCE FOR VASCULAR ACCESS  02/20/2018   Procedure: Ultrasound Guidance For Vascular Access;  Surgeon: Jettie Booze, MD;  Location: [place] CV LAB;  Service: Cardiovascular;;  . UPPER EXTREMITY VENOGRAPHY N/A 07/08/2018   Procedure: UPPER EXTREMITY VENOGRAPHY;  Surgeon: Waynetta Sandy, MD;  Location: [place] CV LAB;  Service: Cardiovascular;  Laterality: N/A;  Bilateral       Family History  Problem Relation Age of Onset  . Hypertension Mother   . Thyroid disease Mother   . Cholelithiasis Daughter   . Cholelithiasis Son   . Hypertension Maternal Grandmother   . Diabetes Maternal Grandmother   . Heart attack Neg Hx   . Hyperlipidemia Neg Hx     Social History   Tobacco Use  . Smoking status: Former Smoker    Packs/day: 1.00    Years: 30.00    Pack years: 30.00    Types: Cigarettes    Quit date: 2006    Years since quitting: 16.0  . Smokeless tobacco: Never Used  Vaping Use  . Vaping Use: Never used  Substance Use Topics  . Alcohol use: Yes    Alcohol/week: 1.0 standard drink    Types: 1 Shots of liquor per week    Comment: occasional  . Drug use: Never     Home Medications Prior to Admission medications   Medication Sig Start Date End Date Taking? Authorizing Provider  acetaminophen (TYLENOL) 500 MG tablet Take 1,000 mg by mouth every 6 (six) hours as needed for moderate pain.   Yes [provider]  allopurinol (ZYLOPRIM) 100 MG tablet TAKE 1 TABLET BY MOUTH 4 TIMES WEEKLY( MONDAY, [place]) AFTER DIALYSIS Patient taking differently: Take 100 mg by mouth See admin instructions. TAKE 1 TABLET BY MOUTH 4 TIMES WEEKLY( MONDAY, TUESDAY,THURDSAY AND SATURDAY) AFTER DIALYSIS 10/20/20  Yes Croitoru, Mihai, MD  amiodarone (PACERONE) 200 MG tablet TAKE 1 TABLET(200 MG) BY MOUTH DAILY Patient taking differently: Take 200 mg by mouth daily. 10/20/20  Yes Croitoru, Mihai, MD  apixaban (ELIQUIS) 5 MG TABS tablet Take 1 tablet (5 mg total) by mouth 2 (two) times daily. 06/24/20  Yes Croitoru, Mihai, MD  [name] 50-200-25 MG TABS tablet TAKE 1 TABLET BY MOUTH DAILY 09/14/20  Yes Tommy Medal, Lavell Islam, MD  lidocaine-prilocaine (EMLA) cream Apply 1 application topically See admin instructions. Use on dialysis days 09/06/20  Yes [provider]  midodrine (PROAMATINE) 10 MG tablet Take 1 tablet (10 mg total) by mouth as directed. Twice a day on the day of HD Tuesday, Thursday, Saturday (1 in AM and 1 at [place]). Take 1/2 tablet (5mg ) a day on non HD days Patient taking differently: Take 10-20 mg by mouth See admin instructions. Take 10 mg in the morning at 5 AM. take 10 mg at 6AM of dialysis days and 10 mg half way through treatment on dialysis days (Mon, Tues, [place], and Sat) 04/22/19  Yes Leonie Man, MD  montelukast (SINGULAIR) 10 MG tablet Take 1 tablet (10 mg total) by mouth at bedtime. 11/03/20 12/03/20 Yes [name], NP  multivitamin (RENA-VIT) TABS tablet Take 1 tablet by mouth every evening. 05/26/20  Yes [provider]  ofloxacin (OCUFLOX) 0.3 % ophthalmic solution Place 1 drop into the right eye 4  (four) times daily. 06/23/20  Yes [provider]  pantoprazole (PROTONIX) 40 MG tablet Take 1 tablet (40 mg total) by mouth daily. 11/25/20  Yes Wendling, Crosby Oyster, DO  polyethylene glycol (MIRALAX / GLYCOLAX) 17 g packet Take 17 g by mouth daily as needed for mild constipation or moderate constipation.    Yes [provider]  sucroferric oxyhydroxide (VELPHORO) 500 MG chewable tablet Chew 500 mg by mouth 3 (three) times daily with meals.   Yes [provider]  heparin 1000 unit/mL SOLN injection Heparin Sodium (Porcine) 1,000 Units/mL Catheter Lock Arterial 08/21/20 08/20/21  [provider]  oxyCODONE (ROXICODONE) 5 MG immediate release tablet Take 1 tablet (5 mg total) by mouth every 4 (four) hours as needed. Patient taking differently: Take 5 mg by mouth every 4 (four) hours as needed for moderate pain. 08/13/20   Angelia Mould, MD    Allergies    Dextromethorphan-guaifenesin, Tocotrienols, and Losartan potassium  Review of Systems   Review of Systems  All other systems reviewed and are negative.   Physical Exam Updated Vital Signs BP (!) 111/56   Pulse 78   Temp (!) 101.7 F (38.7 C) (Oral)   Resp 20   Ht 6\' 2"  (1.88 m)   Wt 87.5 kg   SpO2 100%   BMI 24.78 kg/m   Physical Exam Vitals and nursing note reviewed. Exam conducted with a chaperone present.  Constitutional:      General: He is not in acute distress.    Appearance: He is ill-appearing. He is not toxic-appearing.  HENT:     Head: Normocephalic and atraumatic.  Eyes:     General: No scleral icterus.    Conjunctiva/sclera: Conjunctivae normal.  Cardiovascular:     Rate and Rhythm: Regular rhythm. Tachycardia present.     Pulses: Normal pulses.     Heart sounds: Normal heart sounds.  Pulmonary:     Effort: Pulmonary effort is normal. No respiratory distress.     Breath sounds: Normal breath sounds. No wheezing or rales.  Abdominal:     Comments: Soft,  nondistended.  Small mass noted in right side of groin.  Mild TTP, but no overlying erythema or warmth.  No induration.  No fluctuance.  Musculoskeletal:        General: Normal range of motion.  Skin:    General: Skin is dry.  Neurological:     General: No focal deficit present.     Mental Status: He is alert and oriented to person, place, and time.     GCS: GCS eye subscore is 4. GCS verbal subscore is 5. GCS motor subscore is 6.  Psychiatric:        Mood and Affect: Mood normal.        Behavior: Behavior normal.        Thought Content: Thought content normal.     ED Results / Procedures / Treatments   Labs (all labs ordered are listed, but only abnormal results are displayed) Labs Reviewed  CBC WITH DIFFERENTIAL/PLATELET - Abnormal; Notable for the following components:      Result Value   WBC 13.6 (*)    RBC 2.71 (*)    Hemoglobin 9.6 (*)    HCT 29.4 (*)    MCV 108.5 (*)    MCH 35.4 (*)    RDW 15.6 (*)    Neutro Abs 11.6 (*)    Monocytes Absolute 1.2 (*)    Abs Immature Granulocytes 0.08 (*)    All other components within normal limits  COMPREHENSIVE METABOLIC PANEL - Abnormal; Notable for the following components:   Potassium 3.4 (*)    Chloride 90 (*)    CO2 33 (*)    Glucose, Bld 132 (*)    Creatinine, Ser 4.55 (*)    Albumin 3.1 (*)    GFR, Estimated 13 (*)    Anion gap 17 (*)    All other components within normal limits  BRAIN NATRIURETIC PEPTIDE - Abnormal; Notable for the following components:   B Natriuretic Peptide 2,788.5 (*)    All other components within normal limits  CBG MONITORING, ED - Abnormal; Notable for the following components:   Glucose-Capillary 121 (*)    All other components within normal limits  RESP PANEL BY RT-PCR (FLU A&B, COVID) ARPGX2  CULTURE, BLOOD (ROUTINE X 2)  CULTURE, BLOOD (ROUTINE X 2)  LACTIC ACID, PLASMA  URINALYSIS, ROUTINE W REFLEX MICROSCOPIC  CBC  COMPREHENSIVE METABOLIC PANEL    EKG EKG  Interpretation  Date/Time:  Tuesday November 30 2020 12:46:15 EST Ventricular Rate:  107 PR Interval:  168 QRS Duration: 126 QT Interval:  384 QTC Calculation: 512 R Axis:   -63 Text Interpretation: Sinus tachycardia Left axis deviation Left bundle branch block Confirmed by Lajean Saver [phone]) on 11/30/2020 2:52:09 PM   Radiology CT ABDOMEN PELVIS WO CONTRAST  Result Date: 11/30/2020 CLINICAL DATA:  Abdominal pain, fever, vomiting, weakness. Status post dialysis today. EXAM: CT ABDOMEN AND PELVIS WITHOUT CONTRAST TECHNIQUE: Multidetector CT imaging of the abdomen and pelvis was performed following the standard protocol without IV contrast. COMPARISON:  MRI abdomen dated  05/15/2020. CT abdomen/pelvis dated 01/25/2020. FINDINGS: Lower chest: Mild patchy bilateral lower lobe opacities, atelectasis versus pneumonia. Small bilateral pleural effusions. Cardiomegaly. Hepatobiliary: Unenhanced liver is unremarkable. Mild cholelithiasis in the gallbladder fundus (series 3/image 37), without associated inflammatory changes. No intrahepatic or extrahepatic ductal dilatation. Pancreas: Within normal limits. Spleen: Within normal limits. Adrenals/Urinary Tract: Adrenal glands are within normal limits. Bilateral renal cortical atrophy. Multiple bilateral renal lesions, some of which are hyperdense, measuring up to 1.6 cm in the lateral interpolar left kidney (series 3/image 33) and 2.0 cm in the posterior right lower kidney (series 3/image 37). These are unchanged from the prior and favor benign hemorrhagic cysts, but are poorly characterized. Bladder is decompressed. Stomach/Bowel: Stomach is within normal limits. No evidence of bowel obstruction. Normal appendix (series 3/image 59). Vascular/Lymphatic: No evidence of abdominal aortic aneurysm. Atherosclerotic calcifications of the abdominal aorta and branch vessels. No suspicious abdominopelvic lymphadenopathy. Reproductive: Brachytherapy seeds in the  prostate. Other: No abdominopelvic ascites. Musculoskeletal: Mild degenerative changes of the lumbar spine. Moderate degenerative changes of the left hip. No focal osseous lesions. IMPRESSION: No CT findings to account for the patient's abdominal pain. Mild patchy bilateral lower lobe opacities, atelectasis versus pneumonia. Small bilateral pleural effusions. Cholelithiasis, without associated inflammatory changes. Brachytherapy seeds in the prostate. No findings suspicious for metastatic disease. Electronically Signed   By: Julian Hy M.D.   On: 11/30/2020 16:57   DG Chest Port 1 View  Result Date: 11/30/2020 CLINICAL DATA:  Fever. EXAM: PORTABLE CHEST 1 VIEW COMPARISON:  August 13, 2020 FINDINGS: Multiple leads project over the patient's chest. LEFT-sided dialysis catheter terminates at the caval to atrial junction as before. Heart size is enlarged with signs of central pulmonary vascular congestion and increased interstitial markings. Basilar airspace disease bilaterally. Graded opacity along the inferior LEFT and RIGHT chest. On limited assessment no acute skeletal process. IMPRESSION: 1. Findings most suggestive of volume overload and or heart failure. 2. Basilar airspace disease likely atelectasis. 3. Pattern of disease in the chest could also be seen in the setting of atypical infection with some patchy areas of opacity at the bases bilaterally. Electronically Signed   By: Zetta Bills M.D.   On: 11/30/2020 14:33    Procedures Procedures (including critical care time)  Medications Ordered in ED Medications  cefTRIAXone (ROCEPHIN) 1 g in sodium chloride 0.9 % 100 mL IVPB (1 g Intravenous New Bag/Given (Non-Interop) 11/30/20 1834)  azithromycin (ZITHROMAX) 500 mg in sodium chloride 0.9 % 250 mL IVPB (500 mg Intravenous New Bag/Given 11/30/20 1849)  allopurinol (ZYLOPRIM) tablet 100 mg (has no administration in time range)  bictegravir-emtricitabine-tenofovir AF ([name]) 50-200-25  MG per tablet 1 tablet (has no administration in time range)  amiodarone (PACERONE) tablet 200 mg (has no administration in time range)  midodrine (PROAMATINE) tablet 10-20 mg (has no administration in time range)  pantoprazole (PROTONIX) EC tablet 40 mg (has no administration in time range)  polyethylene glycol (MIRALAX / GLYCOLAX) packet 17 g (has no administration in time range)  sucroferric oxyhydroxide (VELPHORO) chewable tablet 500 mg (has no administration in time range)  apixaban (ELIQUIS) tablet 5 mg (has no administration in time range)  multivitamin (RENA-VIT) tablet 1 tablet (has no administration in time range)  montelukast (SINGULAIR) tablet 10 mg (has no administration in time range)  ofloxacin (OCUFLOX) 0.3 % ophthalmic solution 1 drop (has no administration in time range)  sodium chloride flush (NS) 0.9 % injection 3 mL (has no administration in time range)  acetaminophen (TYLENOL) tablet  650 mg (has no administration in time range)    Or  acetaminophen (TYLENOL) suppository 650 mg (has no administration in time range)  ondansetron (ZOFRAN) tablet 4 mg (has no administration in time range)    Or  ondansetron (ZOFRAN) injection 4 mg (has no administration in time range)  acetaminophen (TYLENOL) tablet 650 mg (650 mg Oral Given 11/30/20 1301)    ED Course  I have reviewed the triage vital signs and the nursing notes.  Pertinent labs & imaging results that were available during my care of the patient were reviewed by me and considered in my medical decision making (see chart for details).  Clinical Course as of 11/30/20 1858  Tue Nov 30, 2020  1841 Spoke with hospitalist, Dr. Trilby Drummer, who will see and admit patient. [GG]    Clinical Course User Index [GG] Corena Herter, PA-C   MDM Rules/Calculators/A&P                          Labs CBC with differential: Leukocytosis to 13.6.  Macrocytic anemia with hemoglobin 9.6, relatively consistent with baseline. CMP:  Consistent with baseline. Lactic acid: Within normal limits. BNP: Elevated 2788.5, well beyond recent labs. Blood cultures: Pending. Respiratory panel PCR: Negative. UA: Condom catheter placed. Not anuric, but very minimal daily urine.  Plain films of chest are personally reviewed and demonstrate findings more suggestive of volume overload and/or heart failure.  There is also a pattern disease in the chest which could be suggestive of atypical infection with patchy areas of opacities in lung bases bilaterally.  Patient will be admitted for CHF exacerbation in addition to meeting SIRS criteria for suspected pneumonia.  Opacities noted on chest x-ray and CT abdomen and pelvis in lower lobes bilaterally concerning for atelectasis versus pneumonia.    Spoke with hospitalist who will see and admit patient.  Final Clinical Impression(s) / ED Diagnoses Final diagnoses:  Acute on chronic combined systolic and diastolic congestive heart failure (HCC)  SIRS (systemic inflammatory response syndrome) [facility])    Rx / DC Orders ED Discharge Orders    None       Corena Herter, PA-C 11/30/20 1843    Corena Herter, PA-C 11/30/20 Mauro Kaufmann    Lajean Saver, MD 12/01/20 [phone]

## 2020-11-30 NOTE — ED Triage Notes (Addendum)
Patient arrived by [facility]. Patient had dialysis today and couldn't get out of car once home. Complains of 2 episodes of vomiting since last night with weakness. Patient denies pain, alert and oriented. Patient states that he feels so fatigued cannot physically ambulate

## 2020-12-01 ENCOUNTER — Inpatient Hospital Stay (HOSPITAL_COMMUNITY): Payer: Medicare HMO

## 2020-12-01 DIAGNOSIS — E8770 Fluid overload, unspecified: Secondary | ICD-10-CM | POA: Diagnosis not present

## 2020-12-01 DIAGNOSIS — I5023 Acute on chronic systolic (congestive) heart failure: Secondary | ICD-10-CM | POA: Diagnosis not present

## 2020-12-01 LAB — CBC
HCT: 28.7 % — ABNORMAL LOW (ref 39.0–52.0)
Hemoglobin: 9.1 g/dL — ABNORMAL LOW (ref 13.0–17.0)
MCH: 34.6 pg — ABNORMAL HIGH (ref 26.0–34.0)
MCHC: 31.7 g/dL (ref 30.0–36.0)
MCV: 109.1 fL — ABNORMAL HIGH (ref 80.0–100.0)
Platelets: 199 10*3/uL (ref 150–400)
RBC: 2.63 MIL/uL — ABNORMAL LOW (ref 4.22–5.81)
RDW: 15.8 % — ABNORMAL HIGH (ref 11.5–15.5)
WBC: 9.5 10*3/uL (ref 4.0–10.5)
nRBC: 0 % (ref 0.0–0.2)

## 2020-12-01 LAB — COMPREHENSIVE METABOLIC PANEL
ALT: 22 U/L (ref 0–44)
AST: 20 U/L (ref 15–41)
Albumin: 2.8 g/dL — ABNORMAL LOW (ref 3.5–5.0)
Alkaline Phosphatase: 84 U/L (ref 38–126)
Anion gap: 13 (ref 5–15)
BUN: 23 mg/dL (ref 8–23)
CO2: 34 mmol/L — ABNORMAL HIGH (ref 22–32)
Calcium: 8.9 mg/dL (ref 8.9–10.3)
Chloride: 94 mmol/L — ABNORMAL LOW (ref 98–111)
Creatinine, Ser: 6.02 mg/dL — ABNORMAL HIGH (ref 0.61–1.24)
GFR, Estimated: 9 mL/min — ABNORMAL LOW (ref >60)
Glucose, Bld: 102 mg/dL — ABNORMAL HIGH (ref 70–99)
Potassium: 3.7 mmol/L (ref 3.5–5.1)
Sodium: 141 mmol/L (ref 135–145)
Total Bilirubin: 0.5 mg/dL (ref 0.3–1.2)
Total Protein: 6.4 g/dL — ABNORMAL LOW (ref 6.5–8.1)

## 2020-12-01 LAB — ECHOCARDIOGRAM COMPLETE
Area-P 1/2: 3.77 cm2
Height: 74 in
MV M vel: 4.35 m/s
MV Peak grad: 75.7 mmHg
P 1/2 time: 383 msec
Radius: 0.4 cm
S' Lateral: 6.1 cm
Weight: 3088 oz

## 2020-12-01 LAB — URINALYSIS, ROUTINE W REFLEX MICROSCOPIC
Bilirubin Urine: NEGATIVE
Glucose, UA: NEGATIVE mg/dL
Ketones, ur: NEGATIVE mg/dL
Nitrite: NEGATIVE
Protein, ur: 100 mg/dL — AB
Specific Gravity, Urine: 1.011 (ref 1.005–1.030)
WBC, UA: >50 WBC/hpf — ABNORMAL HIGH (ref 0–5)
pH: 9 — ABNORMAL HIGH (ref 5.0–8.0)

## 2020-12-01 MED ORDER — CHLORHEXIDINE GLUCONATE CLOTH 2 % EX PADS
6.0000 | MEDICATED_PAD | Freq: Every day | CUTANEOUS | Status: DC
  Administered 2020-12-02 – 2020-12-03 (×2): 6 via TOPICAL

## 2020-12-01 NOTE — Consult Note (Addendum)
[facility] Renal Consultation Note    Indication for Consultation:  Management of ESRD/hemodialysis, anemia, hypertension/volume, and secondary hyperparathyroidism. PCP:  HPI: David [name] is a 79 y.o. male with ESRD, HTN, Hx prostate cancer, Hx CVA, A-fib, HFrEF (EF 20-25%) who was admitted on 12/28 with fever, weakness, and dyspnea.  Apparently, has been having DOE and dysuria for the past few days. Underwent his full dialysis yesterday - reviewed records, came in AT his dry weight, only minimal volume pulled off him. When he returned home, he was so weak that he couldn't get out of the car, which prompted ED evaluation.  In ED, noted to be febrile to 101.7. He was initially tachypneic, improved with nasal O2. Labs showed K 3.4 (post-HD), Hgb 9.6, WBC 13.6, LA normal. Flu/COVID tests were negative. Chest CT showed B small effusions and bibasilar infiltrates. Blood Cx drawn and he was started on Ceftriaxone + azithromycin.   Today - he says he is feeling a little better. Denies dyspnea but still requiring nasal O2. He c/o mild suprapubic tenderness, but otherwise without abdominal pain. No N/V/D reported. No chest. S/p echo this morning - read pending.  Dialyzes at [facility] on MTuThS schedule, does 4x/week due to intolerance of volume overload in setting of his severe systolic HF. Has had some issues with AVG - has clotted 2 times within 1 mo, still has TDC in place.  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   Acute on chronic systolic and diastolic heart failure, NYHA class 4 (HCC)    Anemia, iron deficiency 11/15/2011   Arthritis    "hands, right knee, feet" (02/21/2018)   Atrial fibrillation (HCC)    Cardiac arrest ([place]) 02/17/2018   CHF (congestive heart failure) (HCC)    Chronic combined systolic and diastolic heart failure, NYHA class 3 ([place]) 06/2010   felt to be secondary to hypertensive cardiomyopathy    Chronic lower back pain    CKD (chronic kidney disease) stage V requiring chronic dialysis ([place])    COVID-19 12/2019   ESRD on dialysis [facility])    started 02/2018 [place]   GERD (gastroesophageal reflux disease)    Glaucoma    "I had surgery and dont have it any more"   Gout    daily RX (02/21/2018)   Heart murmur    "mild" per pt   Hepatitis    years ago   HIV (human immunodeficiency virus infection) ([place])    HIV infection ([place])    Hyperlipidemia    Hypertension    Nonischemic cardiomyopathy ([place])    Pneumonia 11/2017   Prostate cancer ([place])    hx of prostate; s/p radioactive seed implant 10/2009 Dr Janice Norrie   Sinus bradycardia    Sleep apnea    does not use a cpap   Stroke [facility])    "mini stroke" years ago   Wears glasses    Past Surgical History:  Procedure Laterality Date   A/V FISTULAGRAM Bilateral 07/30/2020   Procedure: CENTRAL VENOGRAM;  Surgeon: Angelia Mould, MD;  Location: [place] [facility];  Service: Cardiovascular;  Laterality: Bilateral;  bilateral upper venogram    AV FISTULA PLACEMENT Right 10/13/2016   Procedure: ARTERIOVENOUS (AV) FISTULA CREATION;  Surgeon: [name], MD;  Location: [place];  Service: Vascular;  Laterality: Right;   AV FISTULA PLACEMENT Left 02/25/2018   Procedure: INSERTION OF ARTERIOVENOUS (AV) GORE-TEX GRAFT LEFT UPPER ARM;  Surgeon: [name]  S, MD;  Location: [place];  Service: Vascular;  Laterality: Left;   AV FISTULA PLACEMENT Right 08/07/2018   Procedure: Creation of right arm brachiocephalic Fistula;  Surgeon: Waynetta Sandy, MD;  Location: [place];  Service: Vascular;  Laterality: Right;   AV FISTULA PLACEMENT Left 11/10/2019   Procedure: INSERTION OF ARTERIOVENOUS (AV) GORE-TEX GRAFT THIGH;  Surgeon: Elam Dutch, MD;  Location: [place];  Service: Vascular;  Laterality: Left;   AV FISTULA PLACEMENT Right 12/02/2019   Procedure: INSERTION OF ARTERIOVENOUS  (AV) GORE-TEX GRAFT RIGHT  THIGH;  Surgeon: Waynetta Sandy, MD;  Location: [place];  Service: Vascular;  Laterality: Right;   AV FISTULA PLACEMENT Right 08/13/2020   Procedure: INSERTION OF RIGHT ARTERIOVENOUS (AV) GORE-TEX GRAFT ARM;  Surgeon: Angelia Mould, MD;  Location: [place];  Service: Vascular;  Laterality: Right;   [place] Left 06/24/2015   Procedure: BASILIC VEIN TRANSPOSITION;  Surgeon: Mal Misty, MD;  Location: [place];  Service: Vascular;  Laterality: Left;   BIOPSY  03/14/2019   Procedure: BIOPSY;  Surgeon: Irene Shipper, MD;  Location: [place];  Service: Endoscopy;;   CATARACT EXTRACTION, BILATERAL Bilateral    COLONOSCOPY WITH PROPOFOL N/A 03/14/2019   Procedure: COLONOSCOPY WITH PROPOFOL;  Surgeon: Irene Shipper, MD;  Location: [place];  Service: Endoscopy;  Laterality: N/A;   ESOPHAGOGASTRODUODENOSCOPY (EGD) WITH PROPOFOL N/A 05/05/2014   Procedure: ESOPHAGOGASTRODUODENOSCOPY (EGD) WITH PROPOFOL;  Surgeon: Missy Sabins, MD;  Location: [facility];  Service: Endoscopy;  Laterality: N/A;   EYE SURGERY Bilateral    cataract surgery    GLAUCOMA SURGERY Bilateral    INSERTION OF ARTERIOVENOUS (AV) ARTEGRAFT ARM  10/09/2018   Procedure: INSERTION OF ARTERIOVENOUS (AV) 47mm x 41cm ARTEGRAFT LEFT UPPER ARM;  Surgeon: Waynetta Sandy, MD;  Location: [place];  Service: Vascular;;   INSERTION OF DIALYSIS CATHETER Right 07/14/2019   Procedure: Insertion Of Dialysis Catheter;  Surgeon: Elam Dutch, MD;  Location[facility];  Service: Vascular;  Laterality: Right;  placed right Internal Jugular   INSERTION OF DIALYSIS CATHETER Left 08/13/2020   Procedure: INSERTION OF DIALYSIS CATHETER;  Surgeon: Angelia Mould, MD;  Location: [place];  Service: Vascular;  Laterality: Left;   INSERTION PROSTATE RADIATION SEED     IR FLUORO GUIDE CV LINE RIGHT  02/18/2018   IR REMOVAL TUN CV CATH W/O FL  12/06/2018   IR THROMBECTOMY AV  FISTULA W/THROMBOLYSIS INC/SHUNT/IMG RIGHT  11/12/2020   IR THROMBECTOMY AV FISTULA W/THROMBOLYSIS/PTA INC/SHUNT/IMG RIGHT Right 01/26/2020   IR THROMBECTOMY AV FISTULA W/THROMBOLYSIS/PTA INC/SHUNT/IMG RIGHT Right 02/24/2020   IR THROMBECTOMY AV FISTULA W/THROMBOLYSIS/PTA INC/SHUNT/IMG RIGHT Right 04/07/2020   IR US GUIDE VASC ACCESS RIGHT  02/18/2018   IR US GUIDE VASC ACCESS RIGHT  01/26/2020   IR US GUIDE VASC ACCESS RIGHT  04/07/2020   IR US GUIDE VASC ACCESS RIGHT  11/12/2020   LEFT HEART CATH AND CORONARY ANGIOGRAPHY N/A 02/20/2018   Procedure: LEFT HEART CATH AND CORONARY ANGIOGRAPHY;  Surgeon: Jettie Booze, MD;  [facility];;; 50% ostial OM2, 25% mLAD.   LEFT HEART CATH AND CORONARY ANGIOGRAPHY  2004   mildly depressed LV systolic fx EF 71%,GGYIRS coronaries/abdominal aorta/renal arteries.   LOWER EXTREMITY ANGIOGRAM Left 11/17/2019   Procedure: Lower Extremity Fistulogram;  Surgeon: Marty Heck, MD;  Location: [place];  Service: Vascular;  Laterality: Left;   NM MYOCAR PERF WALL MOTION  02/21/2010   No ischemia  or infarction.  EF 27%.   RADIOACTIVE SEED IMPLANT  2010   prostate cancer   REMOVAL OF A DIALYSIS CATHETER Right 08/13/2020   Procedure: REMOVAL OF A DIALYSIS CATHETER;  Surgeon: Angelia Mould, MD;  Location: [place];  Service: Vascular;  Laterality: Right;   REVISION OF ARTERIOVENOUS GORETEX GRAFT Right 07/11/2019   Procedure: REVISION OF ARTERIOVENOUS GORETEX GRAFT RIGHT ARM WITH ARTEGRAFT;  Surgeon: Serafina Mitchell, MD;  Location: [place];  Service: Vascular;  Laterality: Right;   SHUNTOGRAM Right 07/14/2019   Procedure: [place];  Surgeon: Elam Dutch, MD;  Location: [place];  Service: Vascular;  Laterality: Right;   THROMBECTOMY AND REVISION OF ARTERIOVENTOUS (AV) GORETEX  GRAFT Right 07/14/2019   Procedure: THROMBECTOMY AND REVISION OF ARTERIOVENTOUS (AV) GORETEX  GRAFT;  Surgeon: Elam Dutch, MD;  Location: [place];   Service: Vascular;  Laterality: Right;   THROMBECTOMY W/ EMBOLECTOMY Left 02/26/2018   Procedure: THROMBECTOMY ARTERIOVENOUS GRAFT;  Surgeon: Angelia Mould, MD;  Location: [place];  Service: Vascular;  Laterality: Left;   THROMBECTOMY W/ EMBOLECTOMY Left 11/17/2019   Procedure: THROMBECTOMY ARTERIOVENOUS GRAFT LEFT THIGH;  Surgeon: Marty Heck, MD;  Location: [place];  Service: Vascular;  Laterality: Left;   TRANSTHORACIC ECHOCARDIOGRAM  02/2012   ([place]) EF 45-50% with mild global HK.  Mild LVH,LA mod. dilated,mild-mod. 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 AI.   TRANSTHORACIC ECHOCARDIOGRAM  9/'18; 3/'19   a) Severely reduced EF.  25 from 30%.  GR 1 DD with diffuse ecchymosis. Mild AS & AI.  Mild LA/RA dilation; b) (post PEA arest):   Moderately dilated LV with moderate concentric virtually.  EF 15 to 20%.  GR 1 DD.  Paradoxical septal motion. Mild AI.  Mild LA dilation.  Poorly visualized RV.   TRANSTHORACIC ECHOCARDIOGRAM  02/2019   EF 20-25%.  Unable to assess diastolic function (A. Fib).  No LV apical thrombus.  Mild AI.  Mildly reduced RV function, however poorly visualized.   ULTRASOUND GUIDANCE FOR VASCULAR ACCESS  02/20/2018   Procedure: Ultrasound Guidance For Vascular Access;  Surgeon: Jettie Booze, MD;  Location: [place] [facility];  Service: Cardiovascular;;   UPPER EXTREMITY VENOGRAPHY N/A 07/08/2018   Procedure: UPPER EXTREMITY VENOGRAPHY;  Surgeon: Waynetta Sandy, MD;  Location: [place] [facility];  Service: Cardiovascular;  Laterality: N/A;  Bilateral   Family History  Problem Relation Age of Onset   Hypertension Mother    Thyroid disease Mother    Cholelithiasis Daughter    Cholelithiasis Son    Hypertension Maternal Grandmother    Diabetes Maternal Grandmother    Heart attack Neg Hx    Hyperlipidemia Neg Hx    Social History:  reports that he quit smoking about 16 years ago. His smoking use  included cigarettes. He has a 30.00 pack-year smoking history. He has never used smokeless tobacco. He reports current alcohol use of about 1.0 standard drink of alcohol per week. He reports that he does not use drugs.  ROS: As per HPI otherwise negative.  Physical Exam: Vitals:   12/01/20 0700 12/01/20 0715 12/01/20 0930 12/01/20 1015  BP: 133/70 135/67 132/66 124/66  Pulse: 79 79 83 78  Resp: (!) 21 (!) 22 (!) 23 17  Temp:      TempSrc:      SpO2: 99% 97% 94% 100%  Weight:      Height:         General: Well  developed, well nourished, in no acute distress. Nasal O2 in place. Head: Normocephalic, atraumatic, sclera non-icteric, mucus membranes are moist. Neck: Supple without lymphadenopathy/masses. JVD not elevated. Lungs: Clear bilaterally to auscultation without wheezes, rales, or rhonchi.  Heart: RRR with normal S1, S2. No murmurs, rubs, or gallops appreciated. Abdomen: Soft, non-distended with normoactive bowel sounds. Mild suprapubic tenderness, foley in place. Musculoskeletal:  Strength and tone appear normal for age. Lower extremities: No LE edema or ischemic changes, no open wounds. Neuro: Alert and oriented X 3. Moves all extremities spontaneously. Psych:  Responds to questions appropriately with a normal affect. Dialysis Access: R AVF + thrill and TDC in L chest  Allergies  Allergen Reactions   Dextromethorphan-Guaifenesin Other (See Comments)    Unknown reaction   Tocotrienols Other (See Comments)    Unknown reaction   Losartan Potassium Other (See Comments)    Causes constipation   Prior to Admission medications   Medication Sig Start Date End Date Taking? Authorizing Provider  acetaminophen (TYLENOL) 500 MG tablet Take 1,000 mg by mouth every 6 (six) hours as needed for moderate pain.   Yes [provider]  allopurinol (ZYLOPRIM) 100 MG tablet TAKE 1 TABLET BY MOUTH 4 TIMES WEEKLY( MONDAY, [place]) AFTER DIALYSIS Patient taking  differently: Take 100 mg by mouth See admin instructions. TAKE 1 TABLET BY MOUTH 4 TIMES WEEKLY( MONDAY, TUESDAY,THURDSAY AND SATURDAY) AFTER DIALYSIS 10/20/20  Yes Croitoru, Mihai, MD  amiodarone (PACERONE) 200 MG tablet TAKE 1 TABLET(200 MG) BY MOUTH DAILY Patient taking differently: Take 200 mg by mouth daily. 10/20/20  Yes Croitoru, Mihai, MD  apixaban (ELIQUIS) 5 MG TABS tablet Take 1 tablet (5 mg total) by mouth 2 (two) times daily. 06/24/20  Yes Croitoru, Mihai, MD  [name] 50-200-25 MG TABS tablet TAKE 1 TABLET BY MOUTH DAILY 09/14/20  Yes Tommy Medal, Lavell Islam, MD  lidocaine-prilocaine (EMLA) cream Apply 1 application topically See admin instructions. Use on dialysis days 09/06/20  Yes [provider]  midodrine (PROAMATINE) 10 MG tablet Take 1 tablet (10 mg total) by mouth as directed. Twice a day on the day of HD Tuesday, Thursday, Saturday (1 in AM and 1 at [place]). Take 1/2 tablet (5mg ) a day on non HD days Patient taking differently: Take 10-20 mg by mouth See admin instructions. Take 10 mg in the morning at 5 AM. take 10 mg at 6AM of dialysis days and 10 mg half way through treatment on dialysis days (Mon, Tues, [place], and Sat) 04/22/19  Yes Leonie Man, MD  montelukast (SINGULAIR) 10 MG tablet Take 1 tablet (10 mg total) by mouth at bedtime. 11/03/20 12/03/20 Yes [name], NP  multivitamin (RENA-VIT) TABS tablet Take 1 tablet by mouth every evening. 05/26/20  Yes [provider]  ofloxacin (OCUFLOX) 0.3 % ophthalmic solution Place 1 drop into the right eye 4 (four) times daily. 06/23/20  Yes [provider]  pantoprazole (PROTONIX) 40 MG tablet Take 1 tablet (40 mg total) by mouth daily. 11/25/20  Yes Wendling, Crosby Oyster, DO  polyethylene glycol (MIRALAX / GLYCOLAX) 17 g packet Take 17 g by mouth daily as needed for mild constipation or moderate constipation.    Yes [provider]  sucroferric oxyhydroxide (VELPHORO) 500 MG chewable  tablet Chew 500 mg by mouth 3 (three) times daily with meals.   Yes [provider]  heparin 1000 unit/mL SOLN injection Heparin Sodium (Porcine) 1,000 Units/mL Catheter Lock Arterial 08/21/20 08/20/21  [provider]  oxyCODONE (  ROXICODONE) 5 MG immediate release tablet Take 1 tablet (5 mg total) by mouth every 4 (four) hours as needed. Patient taking differently: Take 5 mg by mouth every 4 (four) hours as needed for moderate pain. 08/13/20   Angelia Mould, MD   Current Facility-Administered Medications  Medication Dose Route Frequency Provider Last Rate Last Admin   0.9 %  sodium chloride infusion  250 mL Intravenous PRN Angelia Mould, MD       acetaminophen (TYLENOL) tablet 650 mg  650 mg Oral Q6H PRN Marcelyn Bruins, MD       Or   acetaminophen (TYLENOL) suppository 650 mg  650 mg Rectal Q6H PRN Marcelyn Bruins, MD       [START ON 12/02/2020] allopurinol (ZYLOPRIM) tablet 100 mg  100 mg Oral Once per day on Mon Tue Thu Sat Marcelyn Bruins, MD       amiodarone (PACERONE) tablet 200 mg  200 mg Oral Daily Marcelyn Bruins, MD   200 mg at 12/01/20 1038   apixaban (ELIQUIS) tablet 5 mg  5 mg Oral BID Marcelyn Bruins, MD   5 mg at 12/01/20 1038   azithromycin (ZITHROMAX) 250 mg in dextrose 5 % 125 mL IVPB  250 mg Intravenous Q24H Marcelyn Bruins, MD       bictegravir-emtricitabine-tenofovir AF ([name]) 50-200-25 MG per tablet 1 tablet  1 tablet Oral Daily Marcelyn Bruins, MD       cefTRIAXone (ROCEPHIN) 1 g in sodium chloride 0.9 % 100 mL IVPB  1 g Intravenous Q24H Marcelyn Bruins, MD       [START ON 12/02/2020] midodrine (PROAMATINE) tablet 10 mg  10 mg Oral 2 times per day on Tue Thu Sat Marcelyn Bruins, MD       midodrine (PROAMATINE) tablet 5 mg  5 mg Oral Once per day on Sun Mon Wed Fri Marcelyn Bruins, MD       montelukast (SINGULAIR) tablet 10 mg  10 mg Oral QHS Marcelyn Bruins, MD   10 mg at 11/30/20  2352   multivitamin (RENA-VIT) tablet 1 tablet  1 tablet Oral QPM Marcelyn Bruins, MD   1 tablet at 11/30/20 2042   ofloxacin (OCUFLOX) 0.3 % ophthalmic solution 1 drop  1 drop Right Eye QID Marcelyn Bruins, MD       ondansetron [facility]) tablet 4 mg  4 mg Oral Q6H PRN Marcelyn Bruins, MD       Or   ondansetron [name]) injection 4 mg  4 mg Intravenous Q6H PRN Marcelyn Bruins, MD       pantoprazole (PROTONIX) EC tablet 40 mg  40 mg Oral Daily Marcelyn Bruins, MD   40 mg at 12/01/20 1038   polyethylene glycol (MIRALAX / GLYCOLAX) packet 17 g  17 g Oral Daily PRN Marcelyn Bruins, MD       sodium chloride flush (NS) 0.9 % injection 3 mL  3 mL Intravenous Q12H Marcelyn Bruins, MD   3 mL at 12/01/20 1039   sucroferric oxyhydroxide (VELPHORO) chewable tablet 500 mg  500 mg Oral TID WC Marcelyn Bruins, MD       Current Outpatient Medications  Medication Sig Dispense Refill   acetaminophen (TYLENOL) 500 MG tablet Take 1,000 mg by mouth every 6 (six) hours as needed for moderate pain.     allopurinol (ZYLOPRIM) 100 MG tablet TAKE 1 TABLET BY MOUTH 4 TIMES WEEKLY( MONDAY, TUESDAY,THURDSAY AND  SATURDAY) AFTER DIALYSIS (Patient taking differently: Take 100 mg by mouth See admin instructions. TAKE 1 TABLET BY MOUTH 4 TIMES WEEKLY( MONDAY, TUESDAY,THURDSAY AND SATURDAY) AFTER DIALYSIS) 45 tablet 5   amiodarone (PACERONE) 200 MG tablet TAKE 1 TABLET(200 MG) BY MOUTH DAILY (Patient taking differently: Take 200 mg by mouth daily.) 90 tablet 3   apixaban (ELIQUIS) 5 MG TABS tablet Take 1 tablet (5 mg total) by mouth 2 (two) times daily. 60 tablet 5   [name] 50-200-25 MG TABS tablet TAKE 1 TABLET BY MOUTH DAILY 30 tablet 5   lidocaine-prilocaine (EMLA) cream Apply 1 application topically See admin instructions. Use on dialysis days     midodrine (PROAMATINE) 10 MG tablet Take 1 tablet (10 mg total) by mouth as directed. Twice a day on the day of HD Tuesday, Thursday,  Saturday (1 in AM and 1 at [place]). Take 1/2 tablet (5mg ) a day on non HD days (Patient taking differently: Take 10-20 mg by mouth See admin instructions. Take 10 mg in the morning at 5 AM. take 10 mg at 6AM of dialysis days and 10 mg half way through treatment on dialysis days (Mon, Tues, Thurs, and Sat)) 90 tablet 5   montelukast (SINGULAIR) 10 MG tablet Take 1 tablet (10 mg total) by mouth at bedtime. 30 tablet 5   multivitamin (RENA-VIT) TABS tablet Take 1 tablet by mouth every evening.     ofloxacin (OCUFLOX) 0.3 % ophthalmic solution Place 1 drop into the right eye 4 (four) times daily.     pantoprazole (PROTONIX) 40 MG tablet Take 1 tablet (40 mg total) by mouth daily. 30 tablet 3   polyethylene glycol (MIRALAX / GLYCOLAX) 17 g packet Take 17 g by mouth daily as needed for mild constipation or moderate constipation.      sucroferric oxyhydroxide (VELPHORO) 500 MG chewable tablet Chew 500 mg by mouth 3 (three) times daily with meals.     heparin 1000 unit/mL SOLN injection Heparin Sodium (Porcine) 1,000 Units/mL Catheter Lock Arterial     oxyCODONE (ROXICODONE) 5 MG immediate release tablet Take 1 tablet (5 mg total) by mouth every 4 (four) hours as needed. (Patient taking differently: Take 5 mg by mouth every 4 (four) hours as needed for moderate pain.) 15 tablet 0   Labs: Basic Metabolic Panel: Recent Labs  Lab 11/30/20 1430 12/01/20 0500  NA 140 141  K 3.4* 3.7  CL 90* 94*  CO2 33* 34*  GLUCOSE 132* 102*  BUN 11 23  CREATININE 4.55* 6.02*  CALCIUM 9.0 8.9   Liver Function Tests: Recent Labs  Lab 11/30/20 1430 12/01/20 0500  AST 23 20  ALT 25 22  ALKPHOS 100 84  BILITOT 0.7 0.5  PROT 6.9 6.4*  ALBUMIN 3.1* 2.8*   CBC: Recent Labs  Lab 11/30/20 1430 12/01/20 0500  WBC 13.6* 9.5  NEUTROABS 11.6*  --   HGB 9.6* 9.1*  HCT 29.4* 28.7*  MCV 108.5* 109.1*  PLT 220 199   CBG: Recent Labs  Lab 11/30/20 1455  GLUCAP 121*   Studies/Results: CT ABDOMEN  PELVIS WO CONTRAST  Result Date: 11/30/2020 CLINICAL DATA:  Abdominal pain, fever, vomiting, weakness. Status post dialysis today. EXAM: CT ABDOMEN AND PELVIS WITHOUT CONTRAST TECHNIQUE: Multidetector CT imaging of the abdomen and pelvis was performed following the standard protocol without IV contrast. COMPARISON:  MRI abdomen dated 05/15/2020. CT abdomen/pelvis dated 01/25/2020. FINDINGS: Lower chest: Mild patchy bilateral lower lobe opacities, atelectasis versus pneumonia. Small bilateral pleural effusions. Cardiomegaly. Hepatobiliary:  Unenhanced liver is unremarkable. Mild cholelithiasis in the gallbladder fundus (series 3/image 37), without associated inflammatory changes. No intrahepatic or extrahepatic ductal dilatation. Pancreas: Within normal limits. Spleen: Within normal limits. Adrenals/Urinary Tract: Adrenal glands are within normal limits. Bilateral renal cortical atrophy. Multiple bilateral renal lesions, some of which are hyperdense, measuring up to 1.6 cm in the lateral interpolar left kidney (series 3/image 33) and 2.0 cm in the posterior right lower kidney (series 3/image 37). These are unchanged from the prior and favor benign hemorrhagic cysts, but are poorly characterized. Bladder is decompressed. Stomach/Bowel: Stomach is within normal limits. No evidence of bowel obstruction. Normal appendix (series 3/image 59). Vascular/Lymphatic: No evidence of abdominal aortic aneurysm. Atherosclerotic calcifications of the abdominal aorta and branch vessels. No suspicious abdominopelvic lymphadenopathy. Reproductive: Brachytherapy seeds in the prostate. Other: No abdominopelvic ascites. Musculoskeletal: Mild degenerative changes of the lumbar spine. Moderate degenerative changes of the left hip. No focal osseous lesions. IMPRESSION: No CT findings to account for the patient's abdominal pain. Mild patchy bilateral lower lobe opacities, atelectasis versus pneumonia. Small bilateral pleural effusions.  Cholelithiasis, without associated inflammatory changes. Brachytherapy seeds in the prostate. No findings suspicious for metastatic disease. Electronically Signed   By: Julian Hy M.D.   On: 11/30/2020 16:57   DG Chest Port 1 View  Result Date: 11/30/2020 CLINICAL DATA:  Fever. EXAM: PORTABLE CHEST 1 VIEW COMPARISON:  August 13, 2020 FINDINGS: Multiple leads project over the patient's chest. LEFT-sided dialysis catheter terminates at the caval to atrial junction as before. Heart size is enlarged with signs of central pulmonary vascular congestion and increased interstitial markings. Basilar airspace disease bilaterally. Graded opacity along the inferior LEFT and RIGHT chest. On limited assessment no acute skeletal process. IMPRESSION: 1. Findings most suggestive of volume overload and or heart failure. 2. Basilar airspace disease likely atelectasis. 3. Pattern of disease in the chest could also be seen in the setting of atypical infection with some patchy areas of opacity at the bases bilaterally. Electronically Signed   By: Zetta Bills M.D.   On: 11/30/2020 14:33   Dialysis Orders:  MTuThS at [place], 400/600, EDW 87kg, 2K/2Ca, UF profile 4, AVG + TDC, heparin 2000 - Hectoral 60mcg Iv q HD - Mircera 55mcg Iv q 2 weeks (last 12/26) - Hgb 9.9  Assessment/Plan: 1.  Fever/possible pneumonia and/or UTI: On Ceftriaxone + Azithro. BCx pending.  2.  ESRD: Usual MTTS schedule - full HD yesterday, but pleural effusions. Looks like has lost weight and needs EDW lowered - will gradually lower with next HD tomorrow. 3.  Dialysis access: R AVG patent at the moment, but had clotted 2X within 4 weeks so TDC is still in place. If BCx turn positive, will need to remove the [facility]. Has [facility] eval scheduled in mid-January to discuss revision - wait on consulting them for now. 4.  Hypotension/volume: Uses midodrine TID, no LE edema but with effusions - lower EDW as toleated 5.  Anemia of ESRD:  Hgb 9.1 - just given ESA, watch for now 6.  Metabolic bone disease: CorrCa ok, Phos pending. Resume home binders 7.  Nutrition: Alb low, will add supplements 8.  A-fib 9.  HFrEF (EF 20-25% in 2020) - repeat echo pending.  Veneta Penton, PA-C 12/01/2020, 11:54 AM  [employer]

## 2020-12-01 NOTE — Progress Notes (Signed)
  Echocardiogram 2D Echocardiogram has been performed.  David [name] 12/01/2020, 10:05 AM

## 2020-12-01 NOTE — ED Notes (Signed)
Lunch Tray Ordered @ 1038.  

## 2020-12-01 NOTE — ED Notes (Signed)
Dinner Tray Ordered @ 1714. 

## 2020-12-01 NOTE — Progress Notes (Addendum)
PROGRESS NOTE    [facility]  NID:[id number] DOB: 1941/02/23 DOA: 11/30/2020 PCP: [name], NP   Brief Narrative: 79 year-old with past medical history significant for mixed systolic and diastolic heart failure, prostate cancer status post brachytherapy, HIV, A. fib, COPD, ESRD on hemodialysis TTS, GERD, gout, hypertension, TIA, who presented with 1 or 2 days history of shortness of breath fatigue and vomiting. Or shortness of breath and fatigue for the last 24 hours.  He had episode of vomiting in the middle of the night and early in the morning on admission.  He completed his hemodialysis day prior to admission.  Patient was found to be febrile with temperature 101, leukocytosis white blood cell 13, BNP elevated 2788.  Normal lactic acid.  Covid negative.  Chest x-ray show evidence of volume overload with bibasilar atelectasis versus atypical infection.  CT abdomen and pelvis was performed which showed no acute abnormality other than patchy lower lobe atelectasis.    Assessment & Plan:   Principal Problem:   Volume overload Active Problems:   Iron deficiency anemia   Hemodialysis-associated hypotension   Gastroparesis   Acute on chronic systolic heart failure (HCC)   HIV (human immunodeficiency virus infection) (HCC)   ESRD (end stage renal disease) (HCC)   Atrial fibrillation, chronic (HCC)   CAD (coronary artery disease)   Gastroesophageal reflux disease   Gout   Chronic combined systolic and diastolic heart failure, NYHA class 3 ([place])   1-Pneumonia; Bilateral.  Patient presented with shortness of breath, chest x-ray with infiltrates, leukocytosis and fever. Continue with ceftriaxone and azithromycin Follow blood cultures.  2-Acute on chronic systolic and diastolic heart failure, volume overload: Presents with elevated BNP, had hemodialysis days prior to admission. Follow echo.  Nephrology has been consulted  3-ESRD on hemodialysis: TTS. For  hemodialysis tomorrow. Nephrology consulted.  4-Nausea vomiting history of gastroparesis: CT  abdomen negative Supportive care  5-A. Fib: Continue with amiodarone and Eliquis  Gout: Continue with allopurinol  GERD: Continue with PPI HIV: Last CD4 and viral load in July were 469 and 21 respectively Continue with [name]  Hypotension, chronic:  Continue with midodrine.   Pressure Injury 01/03/20 Buttocks Right Stage 2 -  Partial thickness loss of dermis presenting as a shallow open injury with a red, pink wound bed without slough. (Active)  01/03/20 0930  Location: Buttocks  Location Orientation: Right  Staging: Stage 2 -  Partial thickness loss of dermis presenting as a shallow open injury with a red, pink wound bed without slough.  Wound Description (Comments):   Present on Admission:      Pressure Injury 01/03/20 Scrotum Stage 2 -  Partial thickness loss of dermis presenting as a shallow open injury with a red, pink wound bed without slough. (Active)  01/03/20 0930  Location: Scrotum  Location Orientation:   Staging: Stage 2 -  Partial thickness loss of dermis presenting as a shallow open injury with a red, pink wound bed without slough.  Wound Description (Comments):   Present on Admission:                   Estimated body mass index is 24.78 kg/m as calculated from the following:   Height as of this encounter: 6\' 2"  (1.88 m).   Weight as of this encounter: 87.5 kg.   DVT prophylaxis: Eliquis Code Status: Full code Family Communication: care discussed with patient Disposition Plan:  Status is: Inpatient  Remains inpatient appropriate because:IV treatments appropriate due to  intensity of illness or inability to take PO   Dispo: The patient is from: Home              Anticipated d/c is to: to be determine              Anticipated d/c date is: 2 days              Patient currently is not medically stable to d/c.        Consultants:    Nephrology   Procedures:   none  Antimicrobials:  Ceftriaxone and azithro  Subjective: He is feeling tired, weak. Report dyspnea  Objective: Vitals:   12/01/20 0515 12/01/20 0545 12/01/20 0700 12/01/20 0715  BP: 123/60 131/75 133/70 135/67  Pulse: 78 81 79 79  Resp: 20 19 (!) 21 (!) 22  Temp:      TempSrc:      SpO2: 98% 96% 99% 97%  Weight:      Height:        Intake/Output Summary (Last 24 hours) at 12/01/2020 0802 Last data filed at 11/30/2020 2041 Gross per 24 hour  Intake 500 ml  Output --  Net 500 ml   Filed Weights   11/30/20 1244  Weight: 87.5 kg    Examination:  General exam: Appears calm and comfortable  Respiratory system: BL ronchus Cardiovascular system: S1 & S2 heard, RRR. No JVD, murmurs, rubs, gallops or clicks. No pedal edema. Gastrointestinal system: Abdomen is nondistended, soft and nontender. No organomegaly or masses felt. Normal bowel sounds heard. Central nervous system: Alert and oriented. No focal neurological deficits. Extremities: Symmetric 5 x 5 power. Skin: No rashes, lesions or ulcers Psychiatry: Judgement and insight appear normal. Mood & affect appropriate.     Data Reviewed: I have personally reviewed following labs and imaging studies  CBC: Recent Labs  Lab 11/30/20 1430 12/01/20 0500  WBC 13.6* 9.5  NEUTROABS 11.6*  --   HGB 9.6* 9.1*  HCT 29.4* 28.7*  MCV 108.5* 109.1*  PLT 220 417   Basic Metabolic Panel: Recent Labs  Lab 11/30/20 1430 12/01/20 0500  NA 140 141  K 3.4* 3.7  CL 90* 94*  CO2 33* 34*  GLUCOSE 132* 102*  BUN 11 23  CREATININE 4.55* 6.02*  CALCIUM 9.0 8.9   GFR: Estimated Creatinine Clearance: 11.8 mL/min (A) (by C-G formula based on SCr of 6.02 mg/dL (H)). Liver Function Tests: Recent Labs  Lab 11/30/20 1430 12/01/20 0500  AST 23 20  ALT 25 22  ALKPHOS 100 84  BILITOT 0.7 0.5  PROT 6.9 6.4*  ALBUMIN 3.1* 2.8*   No results for input(s): LIPASE, AMYLASE in the last 168  hours. No results for input(s): AMMONIA in the last 168 hours. Coagulation Profile: No results for input(s): INR, PROTIME in the last 168 hours. Cardiac Enzymes: No results for input(s): CKTOTAL, CKMB, CKMBINDEX, TROPONINI in the last 168 hours. BNP (last 3 results) No results for input(s): PROBNP in the last 8760 hours. HbA1C: No results for input(s): HGBA1C in the last 72 hours. CBG: Recent Labs  Lab 11/30/20 1455  GLUCAP 121*   Lipid Profile: No results for input(s): CHOL, HDL, LDLCALC, TRIG, CHOLHDL, LDLDIRECT in the last 72 hours. Thyroid Function Tests: No results for input(s): TSH, T4TOTAL, FREET4, T3FREE, THYROIDAB in the last 72 hours. Anemia Panel: No results for input(s): VITAMINB12, FOLATE, FERRITIN, TIBC, IRON, RETICCTPCT in the last 72 hours. Sepsis Labs: Recent Labs  Lab 11/30/20 1450  LATICACIDVEN 1.2  Recent Results (from the past 240 hour(s))  Resp Panel by RT-PCR (Flu A&B, Covid) Nasopharyngeal Swab     Status: None   Collection Time: 11/30/20  2:04 PM   Specimen: Nasopharyngeal Swab; Nasopharyngeal(NP) swabs in vial transport medium  Result Value Ref Range Status   SARS Coronavirus 2 by RT PCR NEGATIVE NEGATIVE Final    Comment: (NOTE) SARS-CoV-2 target nucleic acids are NOT DETECTED.  The SARS-CoV-2 RNA is generally detectable in upper respiratory specimens during the acute phase of infection. The lowest concentration of SARS-CoV-2 viral copies this assay can detect is 138 copies/mL. A negative result does not preclude SARS-Cov-2 infection and should not be used as the sole basis for treatment or other patient management decisions. A negative result may occur with  improper specimen collection/handling, submission of specimen other than nasopharyngeal swab, presence of viral mutation(s) within the areas targeted by this assay, and inadequate number of viral copies(<138 copies/mL). A negative result must be combined with clinical observations,  patient history, and epidemiological information. The expected result is Negative.  Fact Sheet for Patients:  EntrepreneurPulse.com.au  Fact Sheet for Healthcare Providers:  IncredibleEmployment.be  This test is no t yet approved or cleared by the [place] FDA and  has been authorized for detection and/or diagnosis of SARS-CoV-2 by FDA under an Emergency Use Authorization (EUA). This EUA will remain  in effect (meaning this test can be used) for the duration of the COVID-19 declaration under Section 564(b)(1) of the Act, 21 U.S.C.section 360bbb-3(b)(1), unless the authorization is terminated  or revoked sooner.       Influenza A by PCR NEGATIVE NEGATIVE Final   Influenza B by PCR NEGATIVE NEGATIVE Final    Comment: (NOTE) The Xpert Xpress SARS-CoV-2/FLU/RSV plus assay is intended as an aid in the diagnosis of influenza from Nasopharyngeal swab specimens and should not be used as a sole basis for treatment. Nasal washings and aspirates are unacceptable for Xpert Xpress SARS-CoV-2/FLU/RSV testing.  Fact Sheet for Patients: EntrepreneurPulse.com.au  Fact Sheet for Healthcare Providers: IncredibleEmployment.be  This test is not yet approved or cleared by the [place] FDA and has been authorized for detection and/or diagnosis of SARS-CoV-2 by FDA under an Emergency Use Authorization (EUA). This EUA will remain in effect (meaning this test can be used) for the duration of the COVID-19 declaration under Section 564(b)(1) of the Act, 21 U.S.C. section 360bbb-3(b)(1), unless the authorization is terminated or revoked.  Performed at [facility], [place] [address] [place], [place] [address]   Blood culture (routine x 2)     Status: None (Preliminary result)   Collection Time: 11/30/20  2:30 PM   Specimen: BLOOD LEFT HAND  Result Value Ref Range Status   Specimen Description BLOOD LEFT HAND   Final   Special Requests   Final    BOTTLES DRAWN AEROBIC AND ANAEROBIC Blood Culture adequate volume   Culture   Final    NO GROWTH < 24 HOURS Performed at [facility], [place] [address] [place], [place] [address]    Report Status PENDING  Incomplete         Radiology Studies: CT ABDOMEN PELVIS WO CONTRAST  Result Date: 11/30/2020 CLINICAL DATA:  Abdominal pain, fever, vomiting, weakness. Status post dialysis today. EXAM: CT ABDOMEN AND PELVIS WITHOUT CONTRAST TECHNIQUE: Multidetector CT imaging of the abdomen and pelvis was performed following the standard protocol without IV contrast. COMPARISON:  MRI abdomen dated 05/15/2020. CT abdomen/pelvis dated 01/25/2020. FINDINGS: Lower chest:  Mild patchy bilateral lower lobe opacities, atelectasis versus pneumonia. Small bilateral pleural effusions. Cardiomegaly. Hepatobiliary: Unenhanced liver is unremarkable. Mild cholelithiasis in the gallbladder fundus (series 3/image 37), without associated inflammatory changes. No intrahepatic or extrahepatic ductal dilatation. Pancreas: Within normal limits. Spleen: Within normal limits. Adrenals/Urinary Tract: Adrenal glands are within normal limits. Bilateral renal cortical atrophy. Multiple bilateral renal lesions, some of which are hyperdense, measuring up to 1.6 cm in the lateral interpolar left kidney (series 3/image 33) and 2.0 cm in the posterior right lower kidney (series 3/image 37). These are unchanged from the prior and favor benign hemorrhagic cysts, but are poorly characterized. Bladder is decompressed. Stomach/Bowel: Stomach is within normal limits. No evidence of bowel obstruction. Normal appendix (series 3/image 59). Vascular/Lymphatic: No evidence of abdominal aortic aneurysm. Atherosclerotic calcifications of the abdominal aorta and branch vessels. No suspicious abdominopelvic lymphadenopathy. Reproductive: Brachytherapy seeds in the prostate. Other: No abdominopelvic ascites.  Musculoskeletal: Mild degenerative changes of the lumbar spine. Moderate degenerative changes of the left hip. No focal osseous lesions. IMPRESSION: No CT findings to account for the patient's abdominal pain. Mild patchy bilateral lower lobe opacities, atelectasis versus pneumonia. Small bilateral pleural effusions. Cholelithiasis, without associated inflammatory changes. Brachytherapy seeds in the prostate. No findings suspicious for metastatic disease. Electronically Signed   By: Julian Hy M.D.   On: 11/30/2020 16:57   DG Chest Port 1 View  Result Date: 11/30/2020 CLINICAL DATA:  Fever. EXAM: PORTABLE CHEST 1 VIEW COMPARISON:  August 13, 2020 FINDINGS: Multiple leads project over the patient's chest. LEFT-sided dialysis catheter terminates at the caval to atrial junction as before. Heart size is enlarged with signs of central pulmonary vascular congestion and increased interstitial markings. Basilar airspace disease bilaterally. Graded opacity along the inferior LEFT and RIGHT chest. On limited assessment no acute skeletal process. IMPRESSION: 1. Findings most suggestive of volume overload and or heart failure. 2. Basilar airspace disease likely atelectasis. 3. Pattern of disease in the chest could also be seen in the setting of atypical infection with some patchy areas of opacity at the bases bilaterally. Electronically Signed   By: Zetta Bills M.D.   On: 11/30/2020 14:33        Scheduled Meds: . [START ON 12/02/2020] allopurinol  100 mg Oral Once per day on Mon Tue Thu Sat  . amiodarone  200 mg Oral Daily  . apixaban  5 mg Oral BID  . bictegravir-emtricitabine-tenofovir AF  1 tablet Oral Daily  . [START ON 12/02/2020] midodrine  10 mg Oral 2 times per day on Tue Thu Sat  . midodrine  5 mg Oral Once per day on Sun Mon Wed Fri  . montelukast  10 mg Oral QHS  . multivitamin  1 tablet Oral QPM  . ofloxacin  1 drop Right Eye QID  . pantoprazole  40 mg Oral Daily  . sodium chloride  flush  3 mL Intravenous Q12H  . sucroferric oxyhydroxide  500 mg Oral TID WC   Continuous Infusions: . sodium chloride    . azithromycin    . cefTRIAXone (ROCEPHIN)  IV       LOS: 1 day    Time spent: 35 minutes    Masen Luallen A [place] Serpe, MD [facility]   If 7PM-7AM, please contact night-coverage [email or web address]  12/01/2020, 8:02 AM

## 2020-12-01 NOTE — ED Notes (Signed)
Echo at the bedside °

## 2020-12-02 DIAGNOSIS — E8770 Fluid overload, unspecified: Secondary | ICD-10-CM | POA: Diagnosis not present

## 2020-12-02 LAB — MRSA PCR SCREENING: MRSA by PCR: NEGATIVE

## 2020-12-02 LAB — URINE CULTURE

## 2020-12-02 MED ORDER — PROSOURCE PLUS PO LIQD
30.0000 mL | Freq: Two times a day (BID) | ORAL | Status: DC
  Administered 2020-12-02 – 2020-12-03 (×2): 30 mL via ORAL
  Filled 2020-12-02 (×2): qty 30

## 2020-12-02 MED ORDER — DOXYCYCLINE HYCLATE 100 MG PO TABS
100.0000 mg | ORAL_TABLET | Freq: Two times a day (BID) | ORAL | Status: DC
  Administered 2020-12-02 – 2020-12-03 (×2): 100 mg via ORAL
  Filled 2020-12-02 (×2): qty 1

## 2020-12-02 MED ORDER — BRIMONIDINE TARTRATE 0.15 % OP SOLN
1.0000 [drp] | Freq: Two times a day (BID) | OPHTHALMIC | Status: DC
  Administered 2020-12-02 – 2020-12-03 (×2): 1 [drp] via OPHTHALMIC
  Filled 2020-12-02: qty 5

## 2020-12-02 MED ORDER — NON FORMULARY
1.0000 [drp] | Freq: Two times a day (BID) | Status: DC
Start: 2020-12-02 — End: 2020-12-03
  Administered 2020-12-02 – 2020-12-03 (×2): 1 [drp] via OPHTHALMIC

## 2020-12-02 MED ORDER — DORZOLAMIDE HCL-TIMOLOL MAL 2-0.5 % OP SOLN
1.0000 [drp] | Freq: Two times a day (BID) | OPHTHALMIC | Status: DC
  Administered 2020-12-02 – 2020-12-03 (×2): 1 [drp] via OPHTHALMIC
  Filled 2020-12-02: qty 10

## 2020-12-02 NOTE — Evaluation (Signed)
Physical Therapy Evaluation Patient Details Name: David [name] MRN: [id number] DOB: [birth year] Today's Date: 12/02/2020   History of Present Illness  79yo male c/o increased fatigue and SOB, nausea and vomiting. Unable to get out of his car after HD. Admitted with volume overload, acute on chronic CHF, possible sepsis. PMH OA, A-fib, CHF, CKD, Covid, ESRD on HD, gout, hepatitis, HIV, HLD, HTN, cardiomyopathy and PEA arrest 3/19, prostate CA, CVA  Clinical Impression   Patient received in bed, very pleasant and cooperative with PT- states "I don't want to lay in bed doing nothing all the time and lose all my strength like last time". Motivated but needed up to [facility] for mobility today and is easily fatigued with gait. Does seem to have some possible STM deficits as well as general impaired awareness of safety/extent of physical deficits. Left in bed positioned to comfort with all needs met, bed alarm active. Would benefit from ongoing rehab in SNF setting.     Follow Up Recommendations SNF;Supervision/Assistance - 24 hour    Equipment Recommendations  Rolling walker with 5" wheels;3in1 (PT)    Recommendations for Other Services       Precautions / Restrictions Precautions Precautions: Fall;Other (comment) Precaution Comments: watch sats Restrictions Weight Bearing Restrictions: No      Mobility  Bed Mobility Overal bed mobility: Needs Assistance Bed Mobility: Supine to Sit;Sit to Supine     Supine to sit: HOB elevated;Mod assist Sit to supine: HOB elevated;Min assist   General bed mobility comments: ModA to bring BLEs off EOB and manage trunk getting to side of bed; only needed MinA for BLE management with HOB maximally elevated wtih return to bed    Transfers Overall transfer level: Needs assistance Equipment used: Rolling walker (2 wheeled) Transfers: Sit to/from Stand Sit to Stand: Mod assist;From elevated surface         General transfer comment: ModA to  boost up from elevated surface, increased effort and cues to push hard through BLEs; once up had a hard time extending trunk and stayed in flexed posture during session  Ambulation/Gait Ambulation/Gait assistance: Min assist Gait Distance (Feet): 70 Feet Assistive device: Rolling walker (2 wheeled) Gait Pattern/deviations: Step-through pattern;Decreased step length - right;Decreased step length - left;Decreased stride length;Trunk flexed;Drifts right/left;Narrow base of support Gait velocity: decreased   General Gait Details: slow but steady with RW once up, at first with step to pattern but improved to step through pattern with ongoing gait training; easily fatigued. Needed MinA for RW management mostly when turning. Cues for proximity to device/upright posture  Stairs            Wheelchair Mobility    Modified Rankin (Stroke Patients Only)       Balance Overall balance assessment: Needs assistance;History of Falls Sitting-balance support: Bilateral upper extremity supported;Feet supported Sitting balance-Leahy Scale: Fair Sitting balance - Comments: initially had R lean sitting at EOB, improved to being able to maintain min guard with BUE support Postural control: Right lateral lean Standing balance support: Bilateral upper extremity supported;During functional activity Standing balance-Leahy Scale: Poor Standing balance comment: heavy reliance on BUE support                             Pertinent Vitals/Pain Pain Assessment: No/denies pain    Home Living Family/patient expects to be discharged to:: Private residence Living Arrangements: Spouse/significant other Available Help at Discharge: Family;Available PRN/intermittently Type of Home: House Home Access: Stairs  to enter Entrance Stairs-Rails: Right Entrance Stairs-Number of Steps: 3 in the front with R rail; usually goes in back door with 1 step and a fence to hold onto Home Layout: One level Home  Equipment: [place] - 2 wheels;Cane - single point;Grab bars - toilet Additional Comments: working on getting a grab bar in the shower    Prior Function Level of Independence: Independent with assistive device(s)         Comments: uses walker and does well with it. Has had a fall within the past 3 months- moved walker out of the way do he could open the door to let the dog in and slipped     Hand Dominance   Dominant Hand: Right    Extremity/Trunk Assessment   Upper Extremity Assessment Upper Extremity Assessment: Defer to OT evaluation    Lower Extremity Assessment Lower Extremity Assessment: Generalized weakness    Cervical / Trunk Assessment Cervical / Trunk Assessment: Kyphotic  Communication   Communication: No difficulties  Cognition Arousal/Alertness: Awake/alert Behavior During Therapy: Flat affect Overall Cognitive Status: No family/caregiver present to determine baseline cognitive functioning Area of Impairment: Memory;Following commands;Safety/judgement;Awareness;Problem solving;Attention                   Current Attention Level: Sustained Memory: Decreased short-term memory Following Commands: Follows one step commands consistently;Follows one step commands with increased time Safety/Judgement: Decreased awareness of safety;Decreased awareness of deficits Awareness: Intellectual Problem Solving: Slow processing;Decreased initiation;Difficulty sequencing;Requires verbal cues;Requires tactile cues General Comments: very pleasant and cooperative but flat affect and very slow processing. Kept getting distracted by tubing of condom cath. Reduced awareness of deficits. Tells me he is 6 foot 2 when per medical records he is 6 foot 6      General Comments      Exercises     Assessment/Plan    PT Assessment Patient needs continued PT services  PT Problem List Decreased strength;Decreased knowledge of use of DME;Decreased activity tolerance;Decreased  safety awareness;Decreased balance;Decreased mobility;Decreased coordination       PT Treatment Interventions DME instruction;Balance training;Gait training;Stair training;Cognitive remediation;Functional mobility training;Patient/family education;Therapeutic activities;Therapeutic exercise    PT Goals (Current goals can be found in the Care Plan section)  Acute Rehab PT Goals Patient Stated Goal: go home with HHPT if safe PT Goal Formulation: With patient Time For Goal Achievement: 12/16/20 Potential to Achieve Goals: Fair    Frequency Min 3X/week   Barriers to discharge        Co-evaluation               AM-PAC PT "6 Clicks" Mobility  Outcome Measure Help needed turning from your back to your side while in a flat bed without using bedrails?: A Little Help needed moving from lying on your back to sitting on the side of a flat bed without using bedrails?: A Lot Help needed moving to and from a bed to a chair (including a wheelchair)?: A Little Help needed standing up from a chair using your arms (e.g., wheelchair or bedside chair)?: A Lot Help needed to walk in hospital room?: A Little Help needed climbing 3-5 steps with a railing? : A Lot 6 Click Score: 15    End of Session Equipment Utilized During Treatment: Gait belt Activity Tolerance: Patient tolerated treatment well Patient left: in bed;with call bell/phone within reach;with bed alarm set Nurse Communication: Mobility status PT Visit Diagnosis: Unsteadiness on feet (R26.81);Difficulty in walking, not elsewhere classified (R26.2);Muscle weakness (generalized) (M62.81);History of falling (Z91.81)  Time: 0927-1002 PT Time Calculation (min) (ACUTE ONLY): 35 min   Charges:   PT Evaluation $PT Eval Moderate Complexity: 1 Mod PT Treatments $Gait Training: 8-22 mins        Windell Norfolk, DPT, PN1   Supplemental Physical Therapist [place]    Pager [phone] Acute Rehab Office [phone]

## 2020-12-02 NOTE — Progress Notes (Signed)
[facility] Progress Note   Subjective:  Seen in room - sitting up in bed, on room air. Looks improved. Just finished eating breakfast. No dyspnea or CP. Abdominal pain improved.  Objective Vitals:   12/01/20 2015 12/01/20 2300 12/02/20 0300 12/02/20 0835  BP:  (!) 144/76 138/72 (!) 162/73  Pulse:  92 87 94  Resp:  17 17 18   Temp:  98.7 F (37.1 C) 98.7 F (37.1 C) 97.9 F (36.6 C)  TempSrc:  Oral Oral Oral  SpO2:  96% 97% 96%  Weight: 86.6 kg     Height: 6\' 6"  (1.981 m)      Physical Exam General: Well appearing man, NAD. Room air. Heart: RRR; no murmur Lungs: CTAB Abdomen: soft, non-tender Extremities: No LE edema Dialysis Access: R AVF + TDC in L chest  Additional Objective Labs: Basic Metabolic Panel: Recent Labs  Lab 11/30/20 1430 12/01/20 0500  NA 140 141  K 3.4* 3.7  CL 90* 94*  CO2 33* 34*  GLUCOSE 132* 102*  BUN 11 23  CREATININE 4.55* 6.02*  CALCIUM 9.0 8.9   Liver Function Tests: Recent Labs  Lab 11/30/20 1430 12/01/20 0500  AST 23 20  ALT 25 22  ALKPHOS 100 84  BILITOT 0.7 0.5  PROT 6.9 6.4*  ALBUMIN 3.1* 2.8*   CBC: Recent Labs  Lab 11/30/20 1430 12/01/20 0500  WBC 13.6* 9.5  NEUTROABS 11.6*  --   HGB 9.6* 9.1*  HCT 29.4* 28.7*  MCV 108.5* 109.1*  PLT 220 199   Blood Culture    Component Value Date/Time   SDES URINE, RANDOM 11/30/2020 2354   SPECREQUEST  11/30/2020 2354    NONE Performed at [facility], [place] [address], [place], [place] [address]    CULT MULTIPLE SPECIES PRESENT, SUGGEST RECOLLECTION (A) 11/30/2020 2354   REPTSTATUS 12/02/2020 FINAL 11/30/2020 2354   Studies/Results: CT ABDOMEN PELVIS WO CONTRAST  Result Date: 11/30/2020 CLINICAL DATA:  Abdominal pain, fever, vomiting, weakness. Status post dialysis today. EXAM: CT ABDOMEN AND PELVIS WITHOUT CONTRAST TECHNIQUE: Multidetector CT imaging of the abdomen and pelvis was performed following the standard protocol without IV contrast.  COMPARISON:  MRI abdomen dated 05/15/2020. CT abdomen/pelvis dated 01/25/2020. FINDINGS: Lower chest: Mild patchy bilateral lower lobe opacities, atelectasis versus pneumonia. Small bilateral pleural effusions. Cardiomegaly. Hepatobiliary: Unenhanced liver is unremarkable. Mild cholelithiasis in the gallbladder fundus (series 3/image 37), without associated inflammatory changes. No intrahepatic or extrahepatic ductal dilatation. Pancreas: Within normal limits. Spleen: Within normal limits. Adrenals/Urinary Tract: Adrenal glands are within normal limits. Bilateral renal cortical atrophy. Multiple bilateral renal lesions, some of which are hyperdense, measuring up to 1.6 cm in the lateral interpolar left kidney (series 3/image 33) and 2.0 cm in the posterior right lower kidney (series 3/image 37). These are unchanged from the prior and favor benign hemorrhagic cysts, but are poorly characterized. Bladder is decompressed. Stomach/Bowel: Stomach is within normal limits. No evidence of bowel obstruction. Normal appendix (series 3/image 59). Vascular/Lymphatic: No evidence of abdominal aortic aneurysm. Atherosclerotic calcifications of the abdominal aorta and branch vessels. No suspicious abdominopelvic lymphadenopathy. Reproductive: Brachytherapy seeds in the prostate. Other: No abdominopelvic ascites. Musculoskeletal: Mild degenerative changes of the lumbar spine. Moderate degenerative changes of the left hip. No focal osseous lesions. IMPRESSION: No CT findings to account for the patient's abdominal pain. Mild patchy bilateral lower lobe opacities, atelectasis versus pneumonia. Small bilateral pleural effusions. Cholelithiasis, without associated inflammatory changes. Brachytherapy seeds in the prostate. No findings suspicious for metastatic  disease. Electronically Signed   By: Julian Hy M.D.   On: 11/30/2020 16:57   DG Chest Port 1 View  Result Date: 11/30/2020 CLINICAL DATA:  Fever. EXAM: PORTABLE CHEST  1 VIEW COMPARISON:  August 13, 2020 FINDINGS: Multiple leads project over the patient's chest. LEFT-sided dialysis catheter terminates at the caval to atrial junction as before. Heart size is enlarged with signs of central pulmonary vascular congestion and increased interstitial markings. Basilar airspace disease bilaterally. Graded opacity along the inferior LEFT and RIGHT chest. On limited assessment no acute skeletal process. IMPRESSION: 1. Findings most suggestive of volume overload and or heart failure. 2. Basilar airspace disease likely atelectasis. 3. Pattern of disease in the chest could also be seen in the setting of atypical infection with some patchy areas of opacity at the bases bilaterally. Electronically Signed   By: Zetta Bills M.D.   On: 11/30/2020 14:33   ECHOCARDIOGRAM COMPLETE  Result Date: 12/01/2020    ECHOCARDIOGRAM REPORT   Patient Name:   MITCHEAL SWEETIN [facility] Date of Exam: 12/01/2020 Medical Rec #:  [id number]            Height:       74.0 in Accession #:    [id number]           Weight:       193.0 lb Date of Birth:  1941/01/24            BSA:          2.141 m Patient Age:    58 years             BP:           124/62 mmHg Patient Gender: M                    HR:           83 bpm. Exam Location:  Inpatient Procedure: 2D Echo, Color Doppler and Cardiac Doppler Indications:    I50.23 Acute on chronic systolic (congestive) heart failure  History:        Patient has prior history of Echocardiogram examinations, most                 recent 03/04/2019. Stroke, Arrythmias:Atrial Fibrillation,                 Signs/Symptoms:Murmur; Risk Factors:Hypertension, Dyslipidemia                 and Sleep Apnea. Cancer. CKD. Cardiac Arrest. COVID-19.  Sonographer:    Jonelle Sidle Dance Referring Phys: [id number] [place]  1. Left ventricular ejection fraction, by estimation, is 20 to 25%. The left ventricle has severely decreased function. The left ventricle demonstrates global  hypokinesis. The left ventricular internal cavity size was moderately dilated. There is mild left ventricular hypertrophy. Left ventricular diastolic parameters are consistent with Grade II diastolic dysfunction (pseudonormalization). Elevated left atrial pressure.  2. Right ventricular systolic function is normal. The right ventricular size is normal. Tricuspid regurgitation signal is inadequate for assessing PA pressure.  3. Left atrial size was mildly dilated.  4. The mitral valve is normal in structure. Moderate mitral valve regurgitation.  5. The aortic valve was not well visualized. Aortic valve regurgitation is mild to moderate. Mild to moderate aortic valve sclerosis/calcification is present, without any evidence of aortic stenosis.  6. Aortic dilatation noted. Aneurysm of the ascending aorta, measuring 47 mm.  7. The inferior vena cava is normal in size  with greater than 50% respiratory variability, suggesting right atrial pressure of 3 mmHg. FINDINGS  Left Ventricle: Left ventricular ejection fraction, by estimation, is 20 to 25%. The left ventricle has severely decreased function. The left ventricle demonstrates global hypokinesis. The left ventricular internal cavity size was moderately dilated. There is mild left ventricular hypertrophy. Left ventricular diastolic parameters are consistent with Grade II diastolic dysfunction (pseudonormalization). Elevated left atrial pressure. Right Ventricle: The right ventricular size is normal. Right vetricular wall thickness was not well visualized. Right ventricular systolic function is normal. Tricuspid regurgitation signal is inadequate for assessing PA pressure. Left Atrium: Left atrial size was mildly dilated. Right Atrium: Right atrial size was normal in size. Pericardium: There is no evidence of pericardial effusion. Mitral Valve: The mitral valve is normal in structure. Moderate mitral valve regurgitation. Tricuspid Valve: The tricuspid valve is normal in  structure. Tricuspid valve regurgitation is trivial. Aortic Valve: The aortic valve was not well visualized. Aortic valve regurgitation is mild to moderate. Aortic regurgitation PHT measures 383 msec. Mild to moderate aortic valve sclerosis/calcification is present, without any evidence of aortic stenosis. Pulmonic Valve: The pulmonic valve was not well visualized. Pulmonic valve regurgitation is not visualized. Aorta: The aortic root is normal in size and structure and aortic dilatation noted. There is an aneurysm involving the ascending aorta measuring 47 mm. Venous: The inferior vena cava is normal in size with greater than 50% respiratory variability, suggesting right atrial pressure of 3 mmHg. IAS/Shunts: The interatrial septum was not well visualized.  LEFT VENTRICLE PLAX 2D LVIDd:         6.80 cm  Diastology LVIDs:         6.10 cm  LV e' medial:    4.79 cm/s LV PW:         1.20 cm  LV E/e' medial:  24.6 LV IVS:        0.80 cm  LV e' lateral:   5.11 cm/s LVOT diam:     2.30 cm  LV E/e' lateral: 23.1 LV SV:         90 LV SV Index:   42 LVOT Area:     4.15 cm  RIGHT VENTRICLE             IVC RV Basal diam:  2.60 cm     IVC diam: 1.90 cm RV S prime:     13.50 cm/s TAPSE (M-mode): 2.2 cm LEFT ATRIUM             Index       RIGHT ATRIUM           Index LA diam:        4.50 cm 2.10 cm/m  RA Area:     16.50 cm LA Vol (A2C):   91.0 ml 42.51 ml/m RA Volume:   44.80 ml  20.93 ml/m LA Vol (A4C):   83.9 ml 39.19 ml/m LA Biplane Vol: 87.2 ml 40.73 ml/m  AORTIC VALVE LVOT Vmax:   115.00 cm/s LVOT Vmean:  71.600 cm/s LVOT VTI:    0.216 m AI PHT:      383 msec  AORTA Ao Root diam: 3.60 cm Ao Asc diam:  4.70 cm MITRAL VALVE MV Area (PHT): 3.77 cm      SHUNTS MV Decel Time: 201 msec      Systemic VTI:  0.22 m MR Peak grad:    75.7 mmHg   Systemic Diam: 2.30 cm MR Mean grad:    49.0 mmHg  MR Vmax:         435.00 cm/s MR Vmean:        325.0 cm/s MR PISA:         1.01 cm MR PISA Eff ROA: 9 mm MR PISA Radius:  0.40 cm MV E  velocity: 118.00 cm/s MV A velocity: 104.00 cm/s MV E/A ratio:  1.13 Oswaldo Milian MD Electronically signed by Oswaldo Milian MD Signature Date/Time: 12/01/2020/1:22:39 PM    Final    Medications:  azithromycin 250 mg (12/01/20 2126)   cefTRIAXone (ROCEPHIN)  IV 1 g (12/01/20 2044)    allopurinol  100 mg Oral Once per day on Mon Tue Thu Sat   amiodarone  200 mg Oral Daily   apixaban  5 mg Oral BID   bictegravir-emtricitabine-tenofovir AF  1 tablet Oral Daily   Chlorhexidine Gluconate Cloth  6 each Topical Q0600   midodrine  10 mg Oral 2 times per day on Tue Thu Sat   midodrine  5 mg Oral Once per day on Sun Mon Wed Fri   montelukast  10 mg Oral QHS   multivitamin  1 tablet Oral QPM   ofloxacin  1 drop Right Eye QID   pantoprazole  40 mg Oral Daily   sodium chloride flush  3 mL Intravenous Q12H   sucroferric oxyhydroxide  500 mg Oral TID WC    Dialysis Orders: [facility] at [place], 400/600, EDW 87kg, 2K/2Ca, UF profile 4, AVG + TDC, heparin 2000 - Hectoral 63mcg Iv q HD - Mircera 4mcg Iv q 2 weeks (last 12/26) - Hgb 9.9  Assessment/Plan: 1.  Fever/possible pneumonia and/or UTI: On Ceftriaxone + Azithro. BCx NGTD, UCx with multiple species.  2.  ESRD: Usual MTTS schedule - HD today. Looks like has lost weight and needs EDW lowered - will gradually lower with HD. 3.  Dialysis access: R AVG patent at the moment, but had clotted 2X within 4 weeks so TDC is still in place. If BCx turn positive, will need to remove the [facility]. Has [facility] eval scheduled in mid-January to discuss revision - wait on consulting them for now. 4.  Hypotension/volume: Uses midodrine TID, no LE edema but with effusions - lower EDW as toleated 5.  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  Metabolic bone disease: CorrCa ok, Phos pending. Resume home binders 7.  Nutrition: Alb low, will add supplements 8.  A-fib: On amiodarone + Eliquis 9. HFrEF (EF  20-25%)  Veneta Penton, PA-C 12/02/2020, 10:05 AM  [facility]

## 2020-12-02 NOTE — Consult Note (Signed)
[facility] CM Inpatient Consult   12/02/2020  [place] 11/23/1941 [id number]   [facility] [[facility]]  Accountable Care Organization [ACO] Patient: [employer]  Patient was assessed for [facility] [[facility]] Care Management for community services. Patient was previously active with [facility] Care Management.  Met with patient at bedside regarding being restarted with [facility] services. Patient agrees to post hospital follow up if he doesn't go to SNF.  He is currently being recommended for a skilled nursing facility for SNF rehab.  Plan:  Continue to follow for disposition and needs.  If returning to community will have [facility] Care Management RNCM assigned. If he goes to a SNF he transition of care needs will be met at that level of care.  Of note, [facility] Care Management services does not replace or interfere with any services that are arranged by inpatient TOC care management team.   For additional questions or referrals please contact:   , RN BSN CCM [facility] Hospital Liaison  [phone] business mobile phone Toll free office [phone]  Fax number: [phone] [email or web address]     

## 2020-12-02 NOTE — Progress Notes (Signed)
PROGRESS NOTE    [facility]  [name] DOB: [birth year] DOA: 11/30/2020 PCP: [name], NP   Brief Narrative: 79 year-old with past medical history significant for mixed systolic and diastolic heart failure, prostate cancer status post brachytherapy, HIV, A. fib, COPD, ESRD on hemodialysis TTS, GERD, gout, hypertension, TIA, who presented with 1 or 2 days history of shortness of breath fatigue and vomiting. Or shortness of breath and fatigue for the last 24 hours.  He had episode of vomiting in the middle of the night and early in the morning on admission.  He completed his hemodialysis day prior to admission.  Patient was found to be febrile with temperature 101, leukocytosis white blood cell 13, BNP elevated 2788.  Normal lactic acid.  Covid negative.  Chest x-ray show evidence of volume overload with bibasilar atelectasis versus atypical infection.  CT abdomen and pelvis was performed which showed no acute abnormality other than patchy lower lobe atelectasis.    Assessment & Plan:   Principal Problem:   Volume overload Active Problems:   Iron deficiency anemia   Hemodialysis-associated hypotension   Gastroparesis   Acute on chronic systolic heart failure (HCC)   HIV (human immunodeficiency virus infection) (HCC)   ESRD (end stage renal disease) (HCC)   Atrial fibrillation, chronic (HCC)   CAD (coronary artery disease)   Gastroesophageal reflux disease   Gout   Chronic combined systolic and diastolic heart failure, NYHA class 3 ([place])   1-Pneumonia; Bilateral Patient presented with shortness of breath, chest x-ray with infiltrates, leukocytosis and fever. Continue with ceftriaxone and azithromycin day 2.  Follow blood cultures. negative.  Afebrile.   2-Acute on chronic systolic and diastolic heart failure, volume overload: Presents with elevated BNP, had hemodialysis days prior to admission. Follow echo.  Nephrology has been consulted ECHO with  persistent low EF 25 %. mitral regurgitation, needs to follow up with cardiology.   3-ESRD on hemodialysis: TTS. Due for HD today.  Nephrology consulted.  4-Nausea vomiting history of gastroparesis: CT  abdomen negative Supportive care  5-A. Fib: Continue with amiodarone and Eliquis  Gout: Continue with allopurinol  GERD: Continue with PPI HIV: Last CD4 and viral load in July were 469 and 21 respectively Continue with [name]  Hypotension, chronic:  Continue with midodrine.   Pressure Injury 01/03/20 Buttocks Right Stage 2 -  Partial thickness loss of dermis presenting as a shallow open injury with a red, pink wound bed without slough. (Active)  01/03/20 0930  Location: Buttocks  Location Orientation: Right  Staging: Stage 2 -  Partial thickness loss of dermis presenting as a shallow open injury with a red, pink wound bed without slough.  Wound Description (Comments):   Present on Admission:      Pressure Injury 01/03/20 Scrotum Stage 2 -  Partial thickness loss of dermis presenting as a shallow open injury with a red, pink wound bed without slough. (Active)  01/03/20 0930  Location: Scrotum  Location Orientation:   Staging: Stage 2 -  Partial thickness loss of dermis presenting as a shallow open injury with a red, pink wound bed without slough.  Wound Description (Comments):   Present on Admission:      Estimated body mass index is 22.06 kg/m as calculated from the following:   Height as of this encounter: 6\' 6"  (1.981 m).   Weight as of this encounter: 86.6 kg.   DVT prophylaxis: Eliquis Code Status: Full code Family Communication: Care discussed with Wife 12/30 Disposition Plan:  Status is: Inpatient  Remains inpatient appropriate because:IV treatments appropriate due to intensity of illness or inability to take PO   Dispo: The patient is from: Home              Anticipated d/c is to: SNF              Anticipated d/c date is: 1 day              Patient  currently is not medically stable to d/c.        Consultants:   Nephrology   Procedures:   none  Antimicrobials:  Ceftriaxone and azithro  Subjective: He is breathing better, still feels weak  Objective: Vitals:   12/01/20 2010 12/01/20 2015 12/01/20 2300 12/02/20 0300  BP: (!) 147/75  (!) 144/76 138/72  Pulse: 92  92 87  Resp: 17  17 17   Temp: 98.7 F (37.1 C)  98.7 F (37.1 C) 98.7 F (37.1 C)  TempSrc: Oral  Oral Oral  SpO2: 95%  96% 97%  Weight:  86.6 kg    Height:  6\' 6"  (1.981 m)     No intake or output data in the 24 hours ending 12/02/20 0827 Filed Weights   11/30/20 1244 12/01/20 2015  Weight: 87.5 kg 86.6 kg    Examination:  General exam: NAD Respiratory system: B/L ronchus Cardiovascular system: S 1, S 2 RRR Gastrointestinal system: BS present, soft, nt Central nervous system: alert, following commad Extremities: Symmetric power Skin:  No rashes   Data Reviewed: I have personally reviewed following labs and imaging studies  CBC: Recent Labs  Lab 11/30/20 1430 12/01/20 0500  WBC 13.6* 9.5  NEUTROABS 11.6*  --   HGB 9.6* 9.1*  HCT 29.4* 28.7*  MCV 108.5* 109.1*  PLT 220 644   Basic Metabolic Panel: Recent Labs  Lab 11/30/20 1430 12/01/20 0500  NA 140 141  K 3.4* 3.7  CL 90* 94*  CO2 33* 34*  GLUCOSE 132* 102*  BUN 11 23  CREATININE 4.55* 6.02*  CALCIUM 9.0 8.9   GFR: Estimated Creatinine Clearance: 12.4 mL/min (A) (by C-G formula based on SCr of 6.02 mg/dL (H)). Liver Function Tests: Recent Labs  Lab 11/30/20 1430 12/01/20 0500  AST 23 20  ALT 25 22  ALKPHOS 100 84  BILITOT 0.7 0.5  PROT 6.9 6.4*  ALBUMIN 3.1* 2.8*   No results for input(s): LIPASE, AMYLASE in the last 168 hours. No results for input(s): AMMONIA in the last 168 hours. Coagulation Profile: No results for input(s): INR, PROTIME in the last 168 hours. Cardiac Enzymes: No results for input(s): CKTOTAL, CKMB, CKMBINDEX, TROPONINI in the last 168  hours. BNP (last 3 results) No results for input(s): PROBNP in the last 8760 hours. HbA1C: No results for input(s): HGBA1C in the last 72 hours. CBG: Recent Labs  Lab 11/30/20 1455  GLUCAP 121*   Lipid Profile: No results for input(s): CHOL, HDL, LDLCALC, TRIG, CHOLHDL, LDLDIRECT in the last 72 hours. Thyroid Function Tests: No results for input(s): TSH, T4TOTAL, FREET4, T3FREE, THYROIDAB in the last 72 hours. Anemia Panel: No results for input(s): VITAMINB12, FOLATE, FERRITIN, TIBC, IRON, RETICCTPCT in the last 72 hours. Sepsis Labs: Recent Labs  Lab 11/30/20 1450  LATICACIDVEN 1.2    Recent Results (from the past 240 hour(s))  Resp Panel by RT-PCR (Flu A&B, Covid) Nasopharyngeal Swab     Status: None   Collection Time: 11/30/20  2:04 PM   Specimen: Nasopharyngeal Swab; Nasopharyngeal(NP)  swabs in vial transport medium  Result Value Ref Range Status   SARS Coronavirus 2 by RT PCR NEGATIVE NEGATIVE Final    Comment: (NOTE) SARS-CoV-2 target nucleic acids are NOT DETECTED.  The SARS-CoV-2 RNA is generally detectable in upper respiratory specimens during the acute phase of infection. The lowest concentration of SARS-CoV-2 viral copies this assay can detect is 138 copies/mL. A negative result does not preclude SARS-Cov-2 infection and should not be used as the sole basis for treatment or other patient management decisions. A negative result may occur with  improper specimen collection/handling, submission of specimen other than nasopharyngeal swab, presence of viral mutation(s) within the areas targeted by this assay, and inadequate number of viral copies(<138 copies/mL). A negative result must be combined with clinical observations, patient history, and epidemiological information. The expected result is Negative.  Fact Sheet for Patients:  EntrepreneurPulse.com.au  Fact Sheet for Healthcare Providers:  IncredibleEmployment.be  This  test is no t yet approved or cleared by the [place] FDA and  has been authorized for detection and/or diagnosis of SARS-CoV-2 by FDA under an Emergency Use Authorization (EUA). This EUA will remain  in effect (meaning this test can be used) for the duration of the COVID-19 declaration under Section 564(b)(1) of the Act, 21 U.S.C.section 360bbb-3(b)(1), unless the authorization is terminated  or revoked sooner.       Influenza A by PCR NEGATIVE NEGATIVE Final   Influenza B by PCR NEGATIVE NEGATIVE Final    Comment: (NOTE) The Xpert Xpress SARS-CoV-2/FLU/RSV plus assay is intended as an aid in the diagnosis of influenza from Nasopharyngeal swab specimens and should not be used as a sole basis for treatment. Nasal washings and aspirates are unacceptable for Xpert Xpress SARS-CoV-2/FLU/RSV testing.  Fact Sheet for Patients: EntrepreneurPulse.com.au  Fact Sheet for Healthcare Providers: IncredibleEmployment.be  This test is not yet approved or cleared by the [place] FDA and has been authorized for detection and/or diagnosis of SARS-CoV-2 by FDA under an Emergency Use Authorization (EUA). This EUA will remain in effect (meaning this test can be used) for the duration of the COVID-19 declaration under Section 564(b)(1) of the Act, 21 U.S.C. section 360bbb-3(b)(1), unless the authorization is terminated or revoked.  Performed at [facility], [place] [address] [place], [place] [address]   Blood culture (routine x 2)     Status: None (Preliminary result)   Collection Time: 11/30/20  2:30 PM   Specimen: BLOOD LEFT HAND  Result Value Ref Range Status   Specimen Description BLOOD LEFT HAND  Final   Special Requests   Final    BOTTLES DRAWN AEROBIC AND ANAEROBIC Blood Culture adequate volume   Culture   Final    NO GROWTH 2 DAYS Performed at [facility], [place] [address], [place], [place] [address]    Report Status PENDING   Incomplete  MRSA PCR Screening     Status: None   Collection Time: 12/01/20  8:21 PM   Specimen: Nasal Mucosa; Nasopharyngeal  Result Value Ref Range Status   MRSA by PCR NEGATIVE NEGATIVE Final    Comment:        The GeneXpert MRSA Assay (FDA approved for NASAL specimens only), is one component of a comprehensive MRSA colonization surveillance program. It is not intended to diagnose MRSA infection nor to guide or monitor treatment for MRSA infections. Performed at [facility], [place] [address], [place],  [address]  Radiology Studies: CT ABDOMEN PELVIS WO CONTRAST  Result Date: 11/30/2020 CLINICAL DATA:  Abdominal pain, fever, vomiting, weakness. Status post dialysis today. EXAM: CT ABDOMEN AND PELVIS WITHOUT CONTRAST TECHNIQUE: Multidetector CT imaging of the abdomen and pelvis was performed following the standard protocol without IV contrast. COMPARISON:  MRI abdomen dated 05/15/2020. CT abdomen/pelvis dated 01/25/2020. FINDINGS: Lower chest: Mild patchy bilateral lower lobe opacities, atelectasis versus pneumonia. Small bilateral pleural effusions. Cardiomegaly. Hepatobiliary: Unenhanced liver is unremarkable. Mild cholelithiasis in the gallbladder fundus (series 3/image 37), without associated inflammatory changes. No intrahepatic or extrahepatic ductal dilatation. Pancreas: Within normal limits. Spleen: Within normal limits. Adrenals/Urinary Tract: Adrenal glands are within normal limits. Bilateral renal cortical atrophy. Multiple bilateral renal lesions, some of which are hyperdense, measuring up to 1.6 cm in the lateral interpolar left kidney (series 3/image 33) and 2.0 cm in the posterior right lower kidney (series 3/image 37). These are unchanged from the prior and favor benign hemorrhagic cysts, but are poorly characterized. Bladder is decompressed. Stomach/Bowel: Stomach is within normal limits. No evidence of bowel obstruction. Normal appendix (series  3/image 59). Vascular/Lymphatic: No evidence of abdominal aortic aneurysm. Atherosclerotic calcifications of the abdominal aorta and branch vessels. No suspicious abdominopelvic lymphadenopathy. Reproductive: Brachytherapy seeds in the prostate. Other: No abdominopelvic ascites. Musculoskeletal: Mild degenerative changes of the lumbar spine. Moderate degenerative changes of the left hip. No focal osseous lesions. IMPRESSION: No CT findings to account for the patient's abdominal pain. Mild patchy bilateral lower lobe opacities, atelectasis versus pneumonia. Small bilateral pleural effusions. Cholelithiasis, without associated inflammatory changes. Brachytherapy seeds in the prostate. No findings suspicious for metastatic disease. Electronically Signed   By: Julian Hy M.D.   On: 11/30/2020 16:57   DG Chest Port 1 View  Result Date: 11/30/2020 CLINICAL DATA:  Fever. EXAM: PORTABLE CHEST 1 VIEW COMPARISON:  August 13, 2020 FINDINGS: Multiple leads project over the patient's chest. LEFT-sided dialysis catheter terminates at the caval to atrial junction as before. Heart size is enlarged with signs of central pulmonary vascular congestion and increased interstitial markings. Basilar airspace disease bilaterally. Graded opacity along the inferior LEFT and RIGHT chest. On limited assessment no acute skeletal process. IMPRESSION: 1. Findings most suggestive of volume overload and or heart failure. 2. Basilar airspace disease likely atelectasis. 3. Pattern of disease in the chest could also be seen in the setting of atypical infection with some patchy areas of opacity at the bases bilaterally. Electronically Signed   By: Zetta Bills M.D.   On: 11/30/2020 14:33   ECHOCARDIOGRAM COMPLETE  Result Date: 12/01/2020    ECHOCARDIOGRAM REPORT   Patient Name:   David [name] [facility] Date of Exam: 12/01/2020 Medical Rec #:  [id number]            Height:       74.0 in Accession #:    [id number]           Weight:        193.0 lb Date of Birth:  1941/08/07            BSA:          2.141 m Patient Age:    10 years             BP:           124/62 mmHg Patient Gender: M                    HR:  83 bpm. Exam Location:  Inpatient Procedure: 2D Echo, Color Doppler and Cardiac Doppler Indications:    I50.23 Acute on chronic systolic (congestive) heart failure  History:        Patient has prior history of Echocardiogram examinations, most                 recent 03/04/2019. Stroke, Arrythmias:Atrial Fibrillation,                 Signs/Symptoms:Murmur; Risk Factors:Hypertension, Dyslipidemia                 and Sleep Apnea. Cancer. CKD. Cardiac Arrest. COVID-19.  Sonographer:    Jonelle Sidle Dance Referring Phys: [id number] [place]  1. Left ventricular ejection fraction, by estimation, is 20 to 25%. The left ventricle has severely decreased function. The left ventricle demonstrates global hypokinesis. The left ventricular internal cavity size was moderately dilated. There is mild left ventricular hypertrophy. Left ventricular diastolic parameters are consistent with Grade II diastolic dysfunction (pseudonormalization). Elevated left atrial pressure.  2. Right ventricular systolic function is normal. The right ventricular size is normal. Tricuspid regurgitation signal is inadequate for assessing PA pressure.  3. Left atrial size was mildly dilated.  4. The mitral valve is normal in structure. Moderate mitral valve regurgitation.  5. The aortic valve was not well visualized. Aortic valve regurgitation is mild to moderate. Mild to moderate aortic valve sclerosis/calcification is present, without any evidence of aortic stenosis.  6. Aortic dilatation noted. Aneurysm of the ascending aorta, measuring 47 mm.  7. The inferior vena cava is normal in size with greater than 50% respiratory variability, suggesting right atrial pressure of 3 mmHg. FINDINGS  Left Ventricle: Left ventricular ejection fraction, by estimation, is 20  to 25%. The left ventricle has severely decreased function. The left ventricle demonstrates global hypokinesis. The left ventricular internal cavity size was moderately dilated. There is mild left ventricular hypertrophy. Left ventricular diastolic parameters are consistent with Grade II diastolic dysfunction (pseudonormalization). Elevated left atrial pressure. Right Ventricle: The right ventricular size is normal. Right vetricular wall thickness was not well visualized. Right ventricular systolic function is normal. Tricuspid regurgitation signal is inadequate for assessing PA pressure. Left Atrium: Left atrial size was mildly dilated. Right Atrium: Right atrial size was normal in size. Pericardium: There is no evidence of pericardial effusion. Mitral Valve: The mitral valve is normal in structure. Moderate mitral valve regurgitation. Tricuspid Valve: The tricuspid valve is normal in structure. Tricuspid valve regurgitation is trivial. Aortic Valve: The aortic valve was not well visualized. Aortic valve regurgitation is mild to moderate. Aortic regurgitation PHT measures 383 msec. Mild to moderate aortic valve sclerosis/calcification is present, without any evidence of aortic stenosis. Pulmonic Valve: The pulmonic valve was not well visualized. Pulmonic valve regurgitation is not visualized. Aorta: The aortic root is normal in size and structure and aortic dilatation noted. There is an aneurysm involving the ascending aorta measuring 47 mm. Venous: The inferior vena cava is normal in size with greater than 50% respiratory variability, suggesting right atrial pressure of 3 mmHg. IAS/Shunts: The interatrial septum was not well visualized.  LEFT VENTRICLE PLAX 2D LVIDd:         6.80 cm  Diastology LVIDs:         6.10 cm  LV e' medial:    4.79 cm/s LV PW:         1.20 cm  LV E/e' medial:  24.6 LV IVS:  0.80 cm  LV e' lateral:   5.11 cm/s LVOT diam:     2.30 cm  LV E/e' lateral: 23.1 LV SV:         90 LV SV  Index:   42 LVOT Area:     4.15 cm  RIGHT VENTRICLE             IVC RV Basal diam:  2.60 cm     IVC diam: 1.90 cm RV S prime:     13.50 cm/s TAPSE (M-mode): 2.2 cm LEFT ATRIUM             Index       RIGHT ATRIUM           Index LA diam:        4.50 cm 2.10 cm/m  RA Area:     16.50 cm LA Vol (A2C):   91.0 ml 42.51 ml/m RA Volume:   44.80 ml  20.93 ml/m LA Vol (A4C):   83.9 ml 39.19 ml/m LA Biplane Vol: 87.2 ml 40.73 ml/m  AORTIC VALVE LVOT Vmax:   115.00 cm/s LVOT Vmean:  71.600 cm/s LVOT VTI:    0.216 m AI PHT:      383 msec  AORTA Ao Root diam: 3.60 cm Ao Asc diam:  4.70 cm MITRAL VALVE MV Area (PHT): 3.77 cm      SHUNTS MV Decel Time: 201 msec      Systemic VTI:  0.22 m MR Peak grad:    75.7 mmHg   Systemic Diam: 2.30 cm MR Mean grad:    49.0 mmHg MR Vmax:         435.00 cm/s MR Vmean:        325.0 cm/s MR PISA:         1.01 cm MR PISA Eff ROA: 9 mm MR PISA Radius:  0.40 cm MV E velocity: 118.00 cm/s MV A velocity: 104.00 cm/s MV E/A ratio:  1.13 Oswaldo Milian MD Electronically signed by Oswaldo Milian MD Signature Date/Time: 12/01/2020/1:22:39 PM    Final         Scheduled Meds: . allopurinol  100 mg Oral Once per day on Mon Tue Thu Sat  . amiodarone  200 mg Oral Daily  . apixaban  5 mg Oral BID  . bictegravir-emtricitabine-tenofovir AF  1 tablet Oral Daily  . Chlorhexidine Gluconate Cloth  6 each Topical Q0600  . midodrine  10 mg Oral 2 times per day on Tue Thu Sat  . midodrine  5 mg Oral Once per day on Sun Mon Wed Fri  . montelukast  10 mg Oral QHS  . multivitamin  1 tablet Oral QPM  . ofloxacin  1 drop Right Eye QID  . pantoprazole  40 mg Oral Daily  . sodium chloride flush  3 mL Intravenous Q12H  . sucroferric oxyhydroxide  500 mg Oral TID WC   Continuous Infusions: . azithromycin 250 mg (12/01/20 2126)  . cefTRIAXone (ROCEPHIN)  IV 1 g (12/01/20 2044)     LOS: 2 days    Time spent: 35 minutes    Denine Brotz A Dashanti Burr, MD [facility]   If  7PM-7AM, please contact night-coverage [email or web address]  12/02/2020, 8:27 AM

## 2020-12-02 NOTE — Progress Notes (Signed)
Patient arrived to unit [facility] bed 13 from emergency department.Assisted patient to bed by nursing staff.Patient alert and oriented x 4. Oriented patient to nursing unit and call bell system.Call bell ,phone and personal items within reach bed alarm set for safety.Educated patient to call for assistance prior to getting out of bed and verbalized understanding.

## 2020-12-03 ENCOUNTER — Encounter (HOSPITAL_COMMUNITY): Payer: Self-pay | Admitting: Internal Medicine

## 2020-12-03 DIAGNOSIS — N186 End stage renal disease: Secondary | ICD-10-CM | POA: Diagnosis not present

## 2020-12-03 DIAGNOSIS — Z992 Dependence on renal dialysis: Secondary | ICD-10-CM | POA: Diagnosis not present

## 2020-12-03 DIAGNOSIS — E8770 Fluid overload, unspecified: Secondary | ICD-10-CM | POA: Diagnosis not present

## 2020-12-03 DIAGNOSIS — B20 Human immunodeficiency virus [HIV] disease: Secondary | ICD-10-CM | POA: Diagnosis not present

## 2020-12-03 LAB — MAGNESIUM: Magnesium: 2 mg/dL (ref 1.7–2.4)

## 2020-12-03 LAB — VITAMIN B12: Vitamin B-12: 1284 pg/mL — ABNORMAL HIGH (ref 180–914)

## 2020-12-03 MED ORDER — CEFUROXIME AXETIL 250 MG PO TABS: 250.0000 mg | ORAL_TABLET | Freq: Every day | ORAL | 0 refills | Status: AC

## 2020-12-03 MED ORDER — DOXYCYCLINE HYCLATE 100 MG PO TABS: 100.0000 mg | ORAL_TABLET | Freq: Two times a day (BID) | ORAL | 0 refills | Status: AC

## 2020-12-03 MED ORDER — SODIUM CHLORIDE 0.9 % IV SOLN: 1.0000 g | Freq: Once | INTRAVENOUS | Status: DC

## 2020-12-03 MED ORDER — CHLORHEXIDINE GLUCONATE CLOTH 2 % EX PADS
6.0000 | MEDICATED_PAD | Freq: Every day | CUTANEOUS | Status: DC
  Administered 2020-12-03: 6 via TOPICAL

## 2020-12-03 MED ORDER — CEFDINIR 300 MG PO CAPS: 300.0000 mg | ORAL_CAPSULE | Freq: Two times a day (BID) | ORAL | Status: DC

## 2020-12-03 NOTE — TOC Initial Note (Signed)
Transition of Care [facility]) - Initial/Assessment Note    Patient Details  Name: David [name] MRN: [id number] Date of Birth: 1941/07/22  Transition of Care [facility]) CM/SW Contact:    Curlene Labrum, RN Phone Number: 12/03/2020, 12:24 PM  Clinical Narrative:                 Case management met with the patient at the bedside regarding transitions of care.  The patient states that he does not want to go to rehab and wants to go home and I called the wife, [name], and she is aware and able to take care of him at home and drive him to dialysis on Monday, Tuesday, Thursday and Saturday.  The patient does not have home health at this time and was offered Medicare choice regarding home health services and he did not have a preference.  I called [facility] and they have accepted him for Home health PT,OT, and aide for non-dialysis days.  The patient states that he has a rollator at home but does not have a 3:1.  I provided the patient with a 3:1 from the [employer] supply for the patient to take home.  The patient will be able to discharge home with home health services and dme.  Expected Discharge Plan: [place] Barriers to Discharge: No Barriers Identified   Patient Goals and CMS Choice Patient states their goals for this hospitalization and ongoing recovery are:: Patient is declining SNf placement and wants to go home with wife and home health services. CMS Medicare.gov Compare Post Acute Care list provided to:: Patient Choice offered to / list presented to : Patient  Expected Discharge Plan and Services Expected Discharge Plan: [place]   Discharge Planning Services: CM Consult Post Acute Care Choice: [place] Living arrangements for the past 2 months: Single Family Home Expected Discharge Date: 12/03/20               DME Arranged: 3-N-1 DME Agency: [facility] Date DME Agency Contacted: 12/03/20 Time DME Agency  Contacted: [phone] Representative spoke with at DME Agency: Provided with 3:1 from [address] supply closet HH Arranged: PT,OT,Nurse's Aide [place]: [place] Date [place]: 12/03/20 Time [place]: 1223 Representative spoke with at [place]: [place] with [facility] accepted pt for PT,OT, aide  Prior Living Arrangements/Services Living arrangements for the past 2 months: Single Family Home Lives with:: Spouse Patient language and need for interpreter reviewed:: Yes Do you feel safe going back to the place where you live?: Yes      Need for Family Participation in Patient Care: Yes (Comment) Care giver support system in place?: Yes (comment) Current home services: DME (has a rolling walker, rollator and raised toilet seat but no 3:1 at home.) Criminal Activity/Legal Involvement Pertinent to Current Situation/Hospitalization: No - Comment as needed  Activities of Daily Living Home Assistive Devices/Equipment: Cane (specify quad or straight) ADL Screening (condition at time of admission) Patient's cognitive ability adequate to safely complete daily activities?: Yes Is the patient deaf or have difficulty hearing?: No Does the patient have difficulty seeing, even when wearing glasses/contacts?: No Does the patient have difficulty concentrating, remembering, or making decisions?: No Patient able to express need for assistance with ADLs?: Yes Does the patient have difficulty dressing or bathing?: No Independently performs ADLs?: Yes (appropriate for developmental age) Does the patient have difficulty walking or climbing stairs?: Yes Weakness of Legs: Both Weakness  of Arms/Hands: None  Permission Sought/Granted Permission sought to share information with : Case Manager Permission granted to share information with : Yes, Verbal Permission Granted     Permission granted to share info w AGENCY: Home health agency, [employer]  Permission granted to share info w Relationship:  spouse     Emotional Assessment Appearance:: Appears stated age Attitude/Demeanor/Rapport: Gracious Affect (typically observed): Accepting Orientation: : Oriented to Self,Oriented to Place,Oriented to  Time,Oriented to Situation Alcohol / Substance Use: Not Applicable Psych Involvement: No (comment)  Admission diagnosis:  SIRS (systemic inflammatory response syndrome) (HCC) [R65.10] Volume overload [E87.70] Acute on chronic combined systolic and diastolic congestive heart failure (HCC) [I50.43] Patient Active Problem List   Diagnosis Date Noted  . Complication of vascular dialysis catheter 09/29/2020  . Disease of gallbladder, unspecified 05/18/2020  . Elevated LFTs   . Abdominal pain   . Transaminitis 05/15/2020  . Encounter for removal of sutures 04/10/2020  . Full code status 02/03/2020  . Chest pain 01/24/2020  . Subtherapeutic international normalized ratio (INR) 01/24/2020  . Elevated troponin 01/24/2020  . Pressure injury of skin 01/07/2020  . Combined systolic and diastolic heart failure (HCC)   . ESRD on dialysis (HCC)   . COVID-19 12/11/2019  . Acute respiratory failure with hypoxemia (HCC) 11/01/2019  . Closed fracture of nasal bones 09/10/2019  . Hyperkalemia 09/02/2019  . Unspecified open wound of right upper arm, initial encounter 06/21/2019  . Shortness of breath 04/12/2019  . 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 Dependence on renal dialysis (HCC) 03/27/2019  . Hypertensive heart and chronic kidney disease with heart failure and with stage 5 chronic kidney disease, or end stage renal disease (HCC) 03/27/2019  . Mesenteric ischemia (HCC)   . Colonic ulcer   . Right lower quadrant pain   . Abnormal CT of the abdomen   . Ischemic colitis (HCC)   . Colitis presumed infectious 03/09/2019  . Acute respiratory failure with hypoxia and hypercapnia (HCC) 03/04/2019  . Pruritus, unspecified 02/21/2019  . Acute pulmonary edema (HCC) 02/11/2019  . Pain, unspecified  01/21/2019  . Paroxysmal atrial fibrillation (HCC) 11/16/2018  . LBBB (left bundle branch block) 11/16/2018  . Encounter for monitoring amiodarone therapy 11/16/2018  . Volume overload 11/01/2018  . Pulmonary edema 10/21/2018  . Gout 10/21/2018  . Hypertensive emergency 10/21/2018  . Chronic combined systolic and diastolic heart failure, NYHA class 3 (HCC) 10/21/2018  . Leukocytosis 10/21/2018  . Chills (without fever) 09/24/2018  . Gastroesophageal reflux disease 08/28/2018  . CAD (coronary artery disease) 04/03/2018  . Palliative care encounter 03/27/2018  . Atrial fibrillation, chronic (HCC) 03/25/2018  . Anemia in chronic kidney disease 03/12/2018  . Coagulation defect, unspecified (HCC) 03/12/2018  . Secondary hyperparathyroidism of renal origin (HCC) 03/12/2018  . Palliative care by specialist   . Advance care planning   . Goals of care, counseling/discussion   . Respiratory arrest (HCC)   . Acute on chronic systolic heart failure (HCC)   . Chronic obstructive pulmonary disease (HCC)   . HIV (human immunodeficiency virus infection) (HCC)   . ESRD (end stage renal disease) (HCC)   . Acute respiratory failure with hypoxia (HCC) 01/21/2018  . Pneumonia   . Aspiration pneumonia of both lower lobes due to regurgitated food (HCC)   . Sepsis due to pneumonia (HCC) 11/22/2017  . AIDS (HCC) 05/14/2014  . 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 Gastroparesis 05/06/2014  . Protein-calorie malnutrition, severe (HCC) 05/05/2014  . ESRD on hemodialysis (HCC) 05/02/2014  .  Hemodialysis-associated hypotension 05/02/2014  . Leaking of conjunctival drainage bleb 05/01/2014  . POAG (primary open-angle glaucoma) 05/01/2014  . Loss of weight 04/29/2014  . Conjunctivitis 04/29/2014  . 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 Low back pain 04/09/2012  . Osteoarthritis of hip 12/29/2011  . Iron deficiency anemia 06/28/2011  . Macrocytic anemia 04/02/2011  . ADENOCARCINOMA, PROSTATE,  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  . Hyperlipidemia 11/30/2010  . Transient cerebral ischemia 11/30/2010  . HYPERGLYCEMIA 11/30/2010   PCP:  [name], NP Pharmacy:   [facility] [place] [address] Phone: [phone] Fax: [phone]  [facility] [address], [place] [address] Phone: [phone] Fax: [phone]     Social Determinants of Health (SDOH) Interventions    Readmission Risk Interventions Readmission Risk Prevention Plan 12/03/2020 01/09/2020 09/02/2019  Transportation Screening Complete Complete Complete  Medication Review (RN Care Manager) Complete Referral to Pharmacy Complete  PCP or Specialist appointment within 3-5 days of discharge Complete Not Complete -  PCP/Specialist Appt Not Complete comments - discharge to SNF -  [place] or Home Care Consult Complete Not Complete -  [place] or Home Care Consult Pt Refusal Comments - discharge to SNF -  SW Recovery Care/Counseling Consult Complete Complete -  Palliative Care Screening Complete Complete Not Applicable  Skilled Nursing Facility Complete Complete -  Some recent data might be hidden

## 2020-12-03 NOTE — Evaluation (Signed)
Occupational Therapy Evaluation Patient Details Name: David [name] MRN: [id number] DOB: 09-Jun-1941 Today's Date: 12/03/2020    History of Present Illness 79yo male c/o increased fatigue and SOB, nausea and vomiting. Unable to get out of his car after HD. Admitted with volume overload, acute on chronic CHF, possible sepsis. PMH OA, A-fib, CHF, CKD, Covid, ESRD on HD, gout, hepatitis, HIV, HLD, HTN, cardiomyopathy and PEA arrest 3/19, prostate CA, CVA   Clinical Impression   PTA, pt lives with spouse and reports typically Modified Independent with mobility using RW. Pt reports able to toilet self, stand for showers and receives light assist for LB ADLs from wife. Pt presents now with deficits in standing balance, endurance, strength and cognition. Pt able to demo mobility to/from bathroom with min guard using RW initially progressing to Mod A due to increasing fatigue with cues needed to avoid crossing feet and safe RW mgmt. Pt overall Min A for UB ADLs and Max A for LB ADLs including toileting hygiene today. Recommend SNF for short term rehab prior to discharge home as pt presents as increased fall risk. Plan to progress endurance, strength (with HEP to be provided) and educate on energy conservation strategies to maintain independence on dialysis days.     Follow Up Recommendations  SNF;Supervision/Assistance - 24 hour    Equipment Recommendations  3 in 1 bedside commode    Recommendations for Other Services       Precautions / Restrictions Precautions Precautions: Fall;Other (comment) Precaution Comments: watch sats Restrictions Weight Bearing Restrictions: No      Mobility Bed Mobility               General bed mobility comments: sitting EOB on entry    Transfers Overall transfer level: Needs assistance Equipment used: Rolling walker (2 wheeled) Transfers: Sit to/from [facility] Sit to Stand: Mod assist;From elevated surface Stand pivot  transfers: Mod assist       General transfer comment: Varied from [place] A to Mod A for sit to stand, minor cues for hand placement and leaning forward during transition. Mod A for turning with RW due to feet getting close together requiring increased assist for balance and cues    Balance Overall balance assessment: Needs assistance;History of Falls Sitting-balance support: Feet supported;No upper extremity supported Sitting balance-Leahy Scale: Fair     Standing balance support: Bilateral upper extremity supported;During functional activity Standing balance-Leahy Scale: Poor Standing balance comment: heavy reliance on BUE support                           ADL either performed or assessed with clinical judgement   ADL Overall ADL's : Needs assistance/impaired Eating/Feeding: Set up;Sitting Eating/Feeding Details (indicate cue type and reason): Finishing breakfast on OT entry Grooming: Minimal assistance;Standing   Upper Body Bathing: Minimal assistance;Sitting   Lower Body Bathing: Maximal assistance;Sit to/from stand   Upper Body Dressing : Minimal assistance;Sitting   Lower Body Dressing: Maximal assistance;Sit to/from stand Lower Body Dressing Details (indicate cue type and reason): unable to cross legs, difficulty reaching B feet at baseline Toilet Transfer: Moderate assistance;Ambulation;Comfort height toilet;Grab bars;RW Armed forces technical officer Details (indicate cue type and reason): Initially min guard for mobility but with fatigue progressing to Mod A at times with cues to stay close to RW and take larger steps as R foot lagging behind at times Toileting- Clothing Manipulation and Hygiene: Maximal assistance;Sit to/from stand Toileting - Clothing Manipulation Details (indicate cue type  and reason): Max A for posterior hygiene in standing. Pt balance and endurance low for being able to complete full task without assist     Functional mobility during ADLs: Moderate  assistance;Rolling walker;Cueing for safety;Cueing for sequencing General ADL Comments: Pt limited by decreased strength and endurance though motivated to do more tasks (unaware of deficits)     Vision Baseline Vision/History: Wears glasses Wears Glasses: At all times Patient Visual Report: No change from baseline Vision Assessment?: No apparent visual deficits     Perception     Praxis      Pertinent Vitals/Pain Pain Assessment: Faces Faces Pain Scale: Hurts a little bit Pain Location: back Pain Descriptors / Indicators: Sore Pain Intervention(s): Monitored during session     Hand Dominance Right   Extremity/Trunk Assessment Upper Extremity Assessment Upper Extremity Assessment: Generalized weakness   Lower Extremity Assessment Lower Extremity Assessment: Defer to PT evaluation   Cervical / Trunk Assessment Cervical / Trunk Assessment: Kyphotic   Communication Communication Communication: No difficulties   Cognition Arousal/Alertness: Awake/alert Behavior During Therapy: Flat affect Overall Cognitive Status: No family/caregiver present to determine baseline cognitive functioning Area of Impairment: Memory;Following commands;Safety/judgement;Awareness;Problem solving                     Memory: Decreased short-term memory Following Commands: Follows one step commands consistently;Follows multi-step commands with increased time Safety/Judgement: Decreased awareness of safety;Decreased awareness of deficits Awareness: Emergent Problem Solving: Slow processing;Decreased initiation;Difficulty sequencing;Requires verbal cues;Requires tactile cues General Comments: Very pleasant and motivated, slow processing for tasks but follows all commands. Decreased awareness of safety/deficits, wondering if he could go home with therapy . Some inconsistencies with PLOF reports   General Comments  Pt reports sciatic nerve difficulties in L LE though appeared to have more  weakness/difficulty with R LE during ambulation    Exercises     Shoulder Instructions      Home Living Family/patient expects to be discharged to:: Private residence Living Arrangements: Spouse/significant other Available Help at Discharge: Family;Available PRN/intermittently Type of Home: House Home Access: Stairs to enter [employer] of Steps: 3 in the front with R rail; usually goes in back door with 1 step and a fence to hold onto Entrance Stairs-Rails: Right Home Layout: One level     Bathroom Shower/Tub: Occupational psychologist: Standard Bathroom Accessibility: Yes   Home Equipment: Environmental consultant - 2 wheels;Cane - single point;Grab bars - toilet;Toilet riser   Additional Comments: working on getting a grab bar in the shower      Prior Functioning/Environment Level of Independence: Independent with assistive device(s)        Comments: uses walker and does well with it. Has had a fall within the past 3 months- moved walker out of the way do he could open the door to let the dog in and slipped. Pt reports typically able to dress self but then later reports wife helps for LB dressing around feet. Reports able to toilet self and shower without assistance        OT Problem List: Decreased strength;Decreased activity tolerance;Impaired balance (sitting and/or standing);Decreased cognition;Decreased safety awareness;Decreased knowledge of use of DME or AE      OT Treatment/Interventions: Self-care/ADL training;Therapeutic exercise;Energy conservation;DME and/or AE instruction;Therapeutic activities;Patient/family education;Balance training    OT Goals(Current goals can be found in the care plan section) Acute Rehab OT Goals Patient Stated Goal: go home with home therapy if safe OT Goal Formulation: With patient Time For Goal Achievement:  12/17/20 Potential to Achieve Goals: Good ADL Goals Pt Will Perform Grooming: with modified independence;standing Pt  Will Perform Lower Body Bathing: with modified independence;sitting/lateral leans;sit to/from stand Pt Will Transfer to Toilet: with modified independence;ambulating Pt Will Perform Toileting - Clothing Manipulation and hygiene: with modified independence;sitting/lateral leans;sit to/from stand Pt/caregiver will Perform Home Exercise Program: Increased strength;Both right and left upper extremity;With theraband;Independently;With written HEP provided Additional ADL Goal #1: Pt to demonstrate implementation of 2 energy conservation strategies during ADLs to combat fatigue post-HD Additional ADL Goal #2: Pt to verbalize at least 2 fall prevention strategies  OT Frequency: Min 2X/week   Barriers to D/C:            Co-evaluation              AM-PAC OT "6 Clicks" Daily Activity     Outcome Measure Help from another person eating meals?: A Little Help from another person taking care of personal grooming?: A Little Help from another person toileting, which includes using toliet, bedpan, or urinal?: A Lot Help from another person bathing (including washing, rinsing, drying)?: A Lot Help from another person to put on and taking off regular upper body clothing?: A Little Help from another person to put on and taking off regular lower body clothing?: A Lot 6 Click Score: 15   End of Session Equipment Utilized During Treatment: Gait belt;Rolling walker Nurse Communication: Mobility status;Other (comment) (endurance, BM)  Activity Tolerance: Patient tolerated treatment well Patient left: in bed;with call bell/phone within reach;with bed alarm set (sitting EOB)  OT Visit Diagnosis: Unsteadiness on feet (R26.81);Other abnormalities of gait and mobility (R26.89);Muscle weakness (generalized) (M62.81);History of falling (Z91.81)                Time: 0539-7673 OT Time Calculation (min): 27 min Charges:  OT General Charges $OT Visit: 1 Visit OT Evaluation $OT Eval Moderate Complexity: 1  Mod OT Treatments $Self Care/Home Management : 8-22 mins  Layla Maw, OTR/L  Layla Maw 12/03/2020, 8:20 AM

## 2020-12-03 NOTE — Progress Notes (Signed)
Physical Therapy Treatment Patient Details Name: David [name] MRN: [id number] DOB: 1941-08-27 Today's Date: 12/03/2020    History of Present Illness 79yo male c/o increased fatigue and SOB, nausea and vomiting. Unable to get out of his car after HD. Admitted with volume overload, acute on chronic CHF, possible sepsis. PMH OA, A-fib, CHF, CKD, Covid, ESRD on HD, gout, hepatitis, HIV, HLD, HTN, cardiomyopathy and PEA arrest 3/19, prostate CA, CVA    PT Comments    Pt supine on arrival, agreeable to therapy session and with good participation and tolerance for mobility. Pt performed bed mobility multiple reps, consistently needing modA to sit fully upright, min to modA for sit<>stand from elevated bed height to RW and minA for pre-gait stepping along EOB with RW. Pt performed seated/supine/standing BLE therapeutic exercises for strengthening/endurance with good tolerance and given HEP handout, encouraged to perform TID as able. Pt reports only mild fatigue (2-3 modified RPE) at end of session, but appeared more fatigued than reported. Pt continues to benefit from skilled rehab in a post acute setting to maximize functional gains before returning home, although if pt chooses to instead DC home, would benefit from HHPT.    Follow Up Recommendations  SNF;Supervision/Assistance - 24 hour     Equipment Recommendations  Rolling walker with 5" wheels;3in1 (PT)    Recommendations for Other Services       Precautions / Restrictions Precautions Precautions: Fall;Other (comment) Precaution Comments: watch sats Restrictions Weight Bearing Restrictions: No    Mobility  Bed Mobility Overal bed mobility: Needs Assistance Bed Mobility: Rolling;Sidelying to Sit;Sit to Supine;Sit to Sidelying Rolling: Supervision Sidelying to sit: Mod assist Supine to sit: Mod assist Sit to supine: Mod assist Sit to sidelying: Min assist General bed mobility comments: reviewed multiple techniques for bed  mobility wtih pt, encouraged pt to perform log rolling for improved independence and to protect back, pt performed to each side of bed and consistently needs modA for trunk rise  Transfers Overall transfer level: Needs assistance Equipment used: Rolling walker (2 wheeled) Transfers: Sit to/from Stand Sit to Stand: Mod assist;From elevated surface         General transfer comment: Varied from [place] A to Mod A for sit to stand, minor cues for hand placement and leaning forward during transition, from elevated bed height to RW  Ambulation/Gait Ambulation/Gait assistance: Min assist Gait Distance (Feet): 3 Feet (x3; sidestepping along EOB and forward/retro steps at bedside) Assistive device: Rolling walker (2 wheeled) Gait Pattern/deviations: Step-through pattern;Decreased step length - right;Decreased step length - left;Decreased stride length;Trunk flexed;Drifts right/left;Narrow base of support     General Gait Details: deferred longer gait trial as pt reporting his DC is imminent, focus on standing therex and pre-gait activity at bedside   Stairs             Wheelchair Mobility    Modified Rankin (Stroke Patients Only)       Balance Overall balance assessment: Needs assistance;History of Falls Sitting-balance support: Feet supported;No upper extremity supported Sitting balance-Leahy Scale: Fair Sitting balance - Comments: some R posterolateral lean during seated therex, needs cues intermittently to correct posture/lean fwd Postural control: Right lateral lean;Posterior lean Standing balance support: Bilateral upper extremity supported;During functional activity Standing balance-Leahy Scale: Poor Standing balance comment: heavy reliance on BUE support of RW                            Cognition Arousal/Alertness: Awake/alert  Overall Cognitive Status: No family/caregiver present to determine baseline cognitive functioning Area of Impairment:  Memory;Following commands;Safety/judgement;Awareness;Problem solving                   Current Attention Level: Sustained Memory: Decreased short-term memory Following Commands: Follows one step commands consistently;Follows multi-step commands with increased time Safety/Judgement: Decreased awareness of safety;Decreased awareness of deficits Awareness: Emergent Problem Solving: Slow processing;Decreased initiation;Difficulty sequencing;Requires verbal cues;Requires tactile cues General Comments: Very pleasant and motivated, slow processing for tasks but follows all commands. Decreased awareness of safety/deficits, wondering if he could go home with therapy . Some inconsistencies with PLOF reports.      Exercises General Exercises - Lower Extremity Ankle Circles/Pumps: AROM;Strengthening;Both;10 reps;Supine Quad Sets: AROM;Both;Supine;10 reps Long Arc Quad: AROM;Strengthening;Both;10 reps;Seated Heel Slides: AROM;Strengthening;10 reps;Supine Straight Leg Raises: AAROM;Strengthening;Both;10 reps;Supine Hip Flexion/Marching: AROM;Strengthening;Both;10 reps;Standing Heel Raises: AROM;Strengthening;Both;10 reps;Standing Other Exercises Other Exercises: Standing BLE AROM: hamstring curls, hip abduction 1x10 reps ea    General Comments        Pertinent Vitals/Pain Pain Assessment: Faces Faces Pain Scale: Hurts a little bit Pain Location: back Pain Descriptors / Indicators: Sore Pain Intervention(s): Monitored during session;Repositioned  Pt HR 83-99 bpm, 96% on RA during session  Home Living Family/patient expects to be discharged to:: Private residence Living Arrangements: Spouse/significant other                  Prior Function            PT Goals (current goals can now be found in the care plan section) Acute Rehab PT Goals Patient Stated Goal: go home with home therapy if safe PT Goal Formulation: With patient Time For Goal Achievement: 12/16/20 Potential  to Achieve Goals: Fair Progress towards PT goals: Progressing toward goals    Frequency    Min 3X/week      PT Plan Current plan remains appropriate    Co-evaluation              AM-PAC PT "6 Clicks" Mobility   Outcome Measure  Help needed turning from your back to your side while in a flat bed without using bedrails?: A Little Help needed moving from lying on your back to sitting on the side of a flat bed without using bedrails?: A Lot Help needed moving to and from a bed to a chair (including a wheelchair)?: A Little Help needed standing up from a chair using your arms (e.g., wheelchair or bedside chair)?: A Lot Help needed to walk in hospital room?: A Little Help needed climbing 3-5 steps with a railing? : A Lot 6 Click Score: 15    End of Session Equipment Utilized During Treatment: Gait belt Activity Tolerance: Patient tolerated treatment well Patient left: in bed;with call bell/phone within reach;with bed alarm set (bed in chair position) Nurse Communication: Mobility status PT Visit Diagnosis: Unsteadiness on feet (R26.81);Difficulty in walking, not elsewhere classified (R26.2);Muscle weakness (generalized) (M62.81);History of falling (Z91.81)     Time: 8756-4332 PT Time Calculation (min) (ACUTE ONLY): 38 min  Charges:  $Therapeutic Exercise: 23-37 mins $Therapeutic Activity: 8-22 mins                     Citlali Gautney P., PTA Acute Rehabilitation Services Pager: [phone] Office: [place] 12/03/2020, 12:10 PM

## 2020-12-03 NOTE — Progress Notes (Addendum)
[facility] Progress Note   Subjective:   Seen in room, reports he is feeling much better overall. Having some GI upset this AM which he describes as bloating. No constipation/diarrhea. Denies SOB, CP, palpitations, dizziness.   Objective Vitals:   12/02/20 1910 12/02/20 1924 12/02/20 2029 12/03/20 0806  BP: 137/64  (!) 127/59 129/65  Pulse:    94  Resp: (!) 24  16 17   Temp:  98.4 F (36.9 C) 98 F (36.7 C) 98.5 F (36.9 C)  TempSrc:   Oral Oral  SpO2:  96% 98% 100%  Weight:      Height:       Physical Exam General: Well developed, pleasant male in NAD Heart: RRR, no murmurs, rubs or gallops Lungs: CTA bilaterally without wheezing, rhonchi or rales Abdomen: Soft, non-tender, non-distended, +BS Extremities: No edema b/l lower extremities Dialysis Access: RUE AVF + bruit, TDC L chest  Additional Objective Labs: Basic Metabolic Panel: Recent Labs  Lab 11/30/20 1430 12/01/20 0500  NA 140 141  K 3.4* 3.7  CL 90* 94*  CO2 33* 34*  GLUCOSE 132* 102*  BUN 11 23  CREATININE 4.55* 6.02*  CALCIUM 9.0 8.9   Liver Function Tests: Recent Labs  Lab 11/30/20 1430 12/01/20 0500  AST 23 20  ALT 25 22  ALKPHOS 100 84  BILITOT 0.7 0.5  PROT 6.9 6.4*  ALBUMIN 3.1* 2.8*   CBC: Recent Labs  Lab 11/30/20 1430 12/01/20 0500  WBC 13.6* 9.5  NEUTROABS 11.6*  --   HGB 9.6* 9.1*  HCT 29.4* 28.7*  MCV 108.5* 109.1*  PLT 220 199   Blood Culture    Component Value Date/Time   SDES URINE, RANDOM 11/30/2020 2354   SPECREQUEST  11/30/2020 2354    NONE Performed at [facility] [address], [place], [place] [address]    CULT MULTIPLE SPECIES PRESENT, SUGGEST RECOLLECTION (A) 11/30/2020 2354   REPTSTATUS 12/02/2020 FINAL 11/30/2020 2354   CBG: Recent Labs  Lab 11/30/20 1455  GLUCAP 121*   Studies/Results: ECHOCARDIOGRAM COMPLETE  Result Date: 12/01/2020    ECHOCARDIOGRAM REPORT   Patient Name:   BETH SPACKMAN [place] Date of Exam:  12/01/2020 Medical Rec #:  [id number]            Height:       74.0 in Accession #:    [id number]           Weight:       193.0 lb Date of Birth:  05/05/1941            BSA:          2.141 m Patient Age:    67 years             BP:           124/62 mmHg Patient Gender: M                    HR:           83 bpm. Exam Location:  Inpatient Procedure: 2D Echo, Color Doppler and Cardiac Doppler Indications:    I50.23 Acute on chronic systolic (congestive) heart failure  History:        Patient has prior history of Echocardiogram examinations, most                 recent 03/04/2019. Stroke, Arrythmias:Atrial Fibrillation,  Signs/Symptoms:Murmur; Risk Factors:Hypertension, Dyslipidemia                 and Sleep Apnea. Cancer. CKD. Cardiac Arrest. COVID-19.  Sonographer:    Jonelle Sidle Dance Referring Phys: [id number] [place]  1. Left ventricular ejection fraction, by estimation, is 20 to 25%. The left ventricle has severely decreased function. The left ventricle demonstrates global hypokinesis. The left ventricular internal cavity size was moderately dilated. There is mild left ventricular hypertrophy. Left ventricular diastolic parameters are consistent with Grade II diastolic dysfunction (pseudonormalization). Elevated left atrial pressure.  2. Right ventricular systolic function is normal. The right ventricular size is normal. Tricuspid regurgitation signal is inadequate for assessing PA pressure.  3. Left atrial size was mildly dilated.  4. The mitral valve is normal in structure. Moderate mitral valve regurgitation.  5. The aortic valve was not well visualized. Aortic valve regurgitation is mild to moderate. Mild to moderate aortic valve sclerosis/calcification is present, without any evidence of aortic stenosis.  6. Aortic dilatation noted. Aneurysm of the ascending aorta, measuring 47 mm.  7. The inferior vena cava is normal in size with greater than 50% respiratory variability,  suggesting right atrial pressure of 3 mmHg. FINDINGS  Left Ventricle: Left ventricular ejection fraction, by estimation, is 20 to 25%. The left ventricle has severely decreased function. The left ventricle demonstrates global hypokinesis. The left ventricular internal cavity size was moderately dilated. There is mild left ventricular hypertrophy. Left ventricular diastolic parameters are consistent with Grade II diastolic dysfunction (pseudonormalization). Elevated left atrial pressure. Right Ventricle: The right ventricular size is normal. Right vetricular wall thickness was not well visualized. Right ventricular systolic function is normal. Tricuspid regurgitation signal is inadequate for assessing PA pressure. Left Atrium: Left atrial size was mildly dilated. Right Atrium: Right atrial size was normal in size. Pericardium: There is no evidence of pericardial effusion. Mitral Valve: The mitral valve is normal in structure. Moderate mitral valve regurgitation. Tricuspid Valve: The tricuspid valve is normal in structure. Tricuspid valve regurgitation is trivial. Aortic Valve: The aortic valve was not well visualized. Aortic valve regurgitation is mild to moderate. Aortic regurgitation PHT measures 383 msec. Mild to moderate aortic valve sclerosis/calcification is present, without any evidence of aortic stenosis. Pulmonic Valve: The pulmonic valve was not well visualized. Pulmonic valve regurgitation is not visualized. Aorta: The aortic root is normal in size and structure and aortic dilatation noted. There is an aneurysm involving the ascending aorta measuring 47 mm. Venous: The inferior vena cava is normal in size with greater than 50% respiratory variability, suggesting right atrial pressure of 3 mmHg. IAS/Shunts: The interatrial septum was not well visualized.  LEFT VENTRICLE PLAX 2D LVIDd:         6.80 cm  Diastology LVIDs:         6.10 cm  LV e' medial:    4.79 cm/s LV PW:         1.20 cm  LV E/e' medial:  24.6  LV IVS:        0.80 cm  LV e' lateral:   5.11 cm/s LVOT diam:     2.30 cm  LV E/e' lateral: 23.1 LV SV:         90 LV SV Index:   42 LVOT Area:     4.15 cm  RIGHT VENTRICLE             IVC RV Basal diam:  2.60 cm     IVC diam: 1.90 cm  RV S prime:     13.50 cm/s TAPSE (M-mode): 2.2 cm LEFT ATRIUM             Index       RIGHT ATRIUM           Index LA diam:        4.50 cm 2.10 cm/m  RA Area:     16.50 cm LA Vol (A2C):   91.0 ml 42.51 ml/m RA Volume:   44.80 ml  20.93 ml/m LA Vol (A4C):   83.9 ml 39.19 ml/m LA Biplane Vol: 87.2 ml 40.73 ml/m  AORTIC VALVE LVOT Vmax:   115.00 cm/s LVOT Vmean:  71.600 cm/s LVOT VTI:    0.216 m AI PHT:      383 msec  AORTA Ao Root diam: 3.60 cm Ao Asc diam:  4.70 cm MITRAL VALVE MV Area (PHT): 3.77 cm      SHUNTS MV Decel Time: 201 msec      Systemic VTI:  0.22 m MR Peak grad:    75.7 mmHg   Systemic Diam: 2.30 cm MR Mean grad:    49.0 mmHg MR Vmax:         435.00 cm/s MR Vmean:        325.0 cm/s MR PISA:         1.01 cm MR PISA Eff ROA: 9 mm MR PISA Radius:  0.40 cm MV E velocity: 118.00 cm/s MV A velocity: 104.00 cm/s MV E/A ratio:  1.13 Oswaldo Milian MD Electronically signed by Oswaldo Milian MD Signature Date/Time: 12/01/2020/1:22:39 PM    Final    Medications:  . (feeding supplement) PROSource Plus  30 mL Oral BID BM  . allopurinol  100 mg Oral Once per day on Mon Tue Thu Sat  . amiodarone  200 mg Oral Daily  . apixaban  5 mg Oral BID  . bictegravir-emtricitabine-tenofovir AF  1 tablet Oral Daily  . brimonidine  1 drop Right Eye BID  . cefdinir  300 mg Oral Q12H  . Chlorhexidine Gluconate Cloth  6 each Topical Q0600  . dorzolamide-timolol  1 drop Right Eye BID  . doxycycline  100 mg Oral Q12H  . midodrine  10 mg Oral 2 times per day on Tue Thu Sat  . midodrine  5 mg Oral Once per day on Sun Mon Wed Fri  . montelukast  10 mg Oral QHS  . multivitamin  1 tablet Oral QPM  . NON FORMULARY 1 drop  1 drop Right Eye BID  . ofloxacin  1 drop Right  Eye QID  . pantoprazole  40 mg Oral Daily  . sodium chloride flush  3 mL Intravenous Q12H  . sucroferric oxyhydroxide  500 mg Oral TID WC    Dialysis Orders:  [facility]uThS at [facility], 400/600, EDW 87kg, 2K/2Ca, UF profile 4, AVG + TDC, heparin 2000 - Hectoral 46mcg Iv q HD - Mircera 90mcg Iv q 2 weeks (last 12/26) - Hgb 9.9  Assessment/Plan:  1. Fever/possible pneumonia and/or UTI: On Ceftriaxone + Azithro. BCx NGTD, UCx with multiple species. 2. ESRD:Usual MTTS schedule - HD tomorrow. Looks like has lost weight and needs EDW lowered - will gradually lower with HD. 3. Dialysis access: R AVG patent at the moment, but had clotted 2X within 4 weeks so TDC is still in place. Blood cultures negative x 3 days. Has [facility] eval scheduled in mid-January to discuss revision.  4. Hypotension/volume:Uses midodrine TID, no LE edema but with  effusions - lower EDW as toleated 5. Anemiaof ESRD:Hgb 9.1 - just given ESA, watch for now 6. Metabolic bone disease:CorrCa ok, Phos pending. Resume home binders 7. Nutrition:Alb low, will add supplements 8. A-fib: On amiodarone + Eliquis 9. HFrEF (EF 20-25%)  Anice Paganini, PA-C 12/03/2020, 9:50 AM  [facility] Pager: [phone]  Pt seen, examined and agree w A/P as above.  Kelly Splinter  MD 12/03/2020, 10:42 AM

## 2020-12-03 NOTE — Discharge Summary (Addendum)
Physician Discharge Summary  [facility] YKD:[id number] DOB: 04-05-1941 DOA: 11/30/2020  PCP: [name], NP  Admit date: 11/30/2020 Discharge date: 12/03/2020  Admitted From: Home  Disposition: Home   Recommendations for Outpatient Follow-up:  1. Follow up with PCP in 1-2 weeks 2. Please obtain BMP/CBC in one week 3. Follow up for resolution of PNA>   Home Health: yes.   Discharge Condition: Stable.  CODE STATUS: Full Code Diet recommendation: Heart Healthy   Brief/Interim Summary: 79 year-old with past medical history significant for mixed systolic and diastolic heart failure, prostate cancer status post brachytherapy, HIV, A. fib, COPD, ESRD on hemodialysis TTS, GERD, gout, hypertension, TIA, who presented with 1 or 2 days history of shortness of breath fatigue and vomiting. Or shortness of breath and fatigue for the last 24 hours.  He had episode of vomiting in the middle of the night and early in the morning on admission.  He completed his hemodialysis day prior to admission.  Patient was found to be febrile with temperature 101, leukocytosis white blood cell 13, BNP elevated 2788.  Normal lactic acid.  Covid negative.  Chest x-ray show evidence of volume overload with bibasilar atelectasis versus atypical infection.  CT abdomen and pelvis was performed which showed no acute abnormality other than patchy lower lobe atelectasis.   1-Pneumonia; Bilateral Patient presented with shortness of breath, chest x-ray with infiltrates, leukocytosis and fever. Continue with ceftriaxone and Dxy day 3.  Follow blood cultures. negative.  Afebrile.  Discharge on Doxy and Cefuroxime for 3 days.  He is feeling better, afebrile, WBC normalized.   2-Acute on chronic systolic and diastolic heart failure, volume overload: Presents with elevated BNP, had hemodialysis days prior to admission. Follow echo.  Nephrology has been consulted ECHO with persistent low EF 25 %. mitral  regurgitation, needs to follow up with cardiology.   3-ESRD on hemodialysis: TTS. Had HD during hospitalization.  Nephrology consulted.  4-Nausea vomiting history of gastroparesis: CT  abdomen negative Supportive care Resolved.   5-A. Fib: Continue with amiodarone and Eliquis  Gout: Continue with allopurinol  GERD: Continue with PPI HIV: Last CD4 and viral load in July were 469 and 21 respectively Continue with [name].  Hypotension, chronic:  Continue with midodrine.    Pressure Injury 01/03/20 Buttocks Right Stage 2 -  Partial thickness loss of dermis presenting as a shallow open injury with a red, pink wound bed without slough. (Active)  01/03/20 0930  Location: Buttocks  Location Orientation: Right  Staging: Stage 2 -  Partial thickness loss of dermis presenting as a shallow open injury with a red, pink wound bed without slough.  Wound Description (Comments):   Present on Admission:      Pressure Injury 01/03/20 Scrotum Stage 2 -  Partial thickness loss of dermis presenting as a shallow open injury with a red, pink wound bed without slough. (Active)  01/03/20 0930  Location: Scrotum  Location Orientation:   Staging: Stage 2 -  Partial thickness loss of dermis presenting as a shallow open injury with a red, pink wound bed without slough.  Wound Description (Comments):   Present on Admission:       Discharge Diagnoses:  Principal Problem:   Volume overload Active Problems:   Iron deficiency anemia   Hemodialysis-associated hypotension   Gastroparesis   Acute on chronic systolic heart failure (HCC)   HIV (human immunodeficiency virus infection) ([place])   ESRD (end stage renal disease) (HCC)   Atrial fibrillation, chronic ([place])  CAD (coronary artery disease)   Gastroesophageal reflux disease   Gout   Chronic combined systolic and diastolic heart failure, NYHA class 3 ([place])    Discharge Instructions  Discharge Instructions    Diet - low sodium  heart healthy   Complete by: As directed    Increase activity slowly   Complete by: As directed      Allergies as of 12/03/2020      Reactions   Dextromethorphan-guaifenesin Other (See Comments)   Unknown reaction   Tocotrienols Other (See Comments)   Unknown reaction   Losartan Potassium Other (See Comments)   Causes constipation      Medication List    TAKE these medications   acetaminophen 500 MG tablet Commonly known as: TYLENOL Take 1,000 mg by mouth every 6 (six) hours as needed for moderate pain.   allopurinol 100 MG tablet Commonly known as: ZYLOPRIM TAKE 1 TABLET BY MOUTH 4 TIMES WEEKLY( MONDAY, [place]) AFTER DIALYSIS What changed:   how much to take  how to take this  when to take this   amiodarone 200 MG tablet Commonly known as: PACERONE TAKE 1 TABLET(200 MG) BY MOUTH DAILY What changed:   how much to take  how to take this  when to take this  additional instructions   apixaban 5 MG Tabs tablet Commonly known as: Eliquis Take 1 tablet (5 mg total) by mouth 2 (two) times daily.   [name] 50-200-25 MG Tabs tablet Generic drug: bictegravir-emtricitabine-tenofovir AF TAKE 1 TABLET BY MOUTH DAILY   brimonidine 0.1 % Soln Commonly known as: ALPHAGAN P Place 1 drop into the right eye 2 (two) times daily.   cefUROXime 250 MG tablet Commonly known as: CEFTIN Take 1 tablet (250 mg total) by mouth daily for 3 days.   dorzolamide-timolol 22.3-6.8 MG/ML ophthalmic solution Commonly known as: COSOPT Place 1 drop into the right eye 2 (two) times daily.   doxycycline 100 MG tablet Commonly known as: VIBRA-TABS Take 1 tablet (100 mg total) by mouth every 12 (twelve) hours for 3 days.   heparin 1000 unit/mL Soln injection Heparin Sodium (Porcine) 1,000 Units/mL Catheter Lock Arterial   lidocaine-prilocaine cream Commonly known as: EMLA Apply 1 application topically See admin instructions. Use on dialysis days   midodrine  10 MG tablet Commonly known as: PROAMATINE Take 1 tablet (10 mg total) by mouth as directed. Twice a day on the day of HD Tuesday, Thursday, Saturday (1 in AM and 1 at [place]). Take 1/2 tablet (5mg ) a day on non HD days What changed:   how much to take  when to take this  additional instructions   montelukast 10 MG tablet Commonly known as: SINGULAIR Take 1 tablet (10 mg total) by mouth at bedtime.   multivitamin Tabs tablet Take 1 tablet by mouth every evening.   ofloxacin 0.3 % ophthalmic solution Commonly known as: OCUFLOX Place 1 drop into the right eye 4 (four) times daily.   oxyCODONE 5 MG immediate release tablet Commonly known as: Roxicodone Take 1 tablet (5 mg total) by mouth every 4 (four) hours as needed. What changed: reasons to take this   pantoprazole 40 MG tablet Commonly known as: PROTONIX Take 1 tablet (40 mg total) by mouth daily.   polyethylene glycol 17 g packet Commonly known as: MIRALAX / GLYCOLAX Take 17 g by mouth daily as needed for mild constipation or moderate constipation.   Velphoro 500 MG chewable tablet Generic drug: sucroferric oxyhydroxide Chew 500 mg  by mouth 3 (three) times daily with meals.            Durable Medical Equipment  (From admission, onward)         Start     Ordered   12/03/20 0948  For home use only DME 3 n 1  Once        12/03/20 0947   12/03/20 0948  For home use only DME Walker rolling  Once       Question Answer Comment  Walker: With 5 Inch Wheels   Patient needs a walker to treat with the following condition Balance disorder      12/03/20 0947          Follow-up Information    [name], NP Follow up in 1 week(s).   Specialty: Internal Medicine Contact information: [place] [address] [phone]        Tommy Medal, Lavell Islam, MD .   Specialty: Infectious Diseases Contact information: [address] [address] [phone]         [name], MD .   Specialty: Cardiology Contact information: [place] [address] [phone]        [facility] Follow up.   Why: You were provided with a 3:1 to take home. Contact information: [address] [place] [address] [phone]        Care, [facility] Follow up.   Specialty: Home Health Services Why: Alvis Lemmings will be providing you with a physical therapist, occupational therapist and aide for home health services.  They will call you in the next 24-48 hours to set up therapy times. Contact information: [place] [address] [phone]              Allergies  Allergen Reactions  . Dextromethorphan-Guaifenesin Other (See Comments)    Unknown reaction  . Tocotrienols Other (See Comments)    Unknown reaction  . Losartan Potassium Other (See Comments)    Causes constipation    Consultations:  Nephrology   Procedures/Studies: CT ABDOMEN PELVIS WO CONTRAST  Result Date: 11/30/2020 CLINICAL DATA:  Abdominal pain, fever, vomiting, weakness. Status post dialysis today. EXAM: CT ABDOMEN AND PELVIS WITHOUT CONTRAST TECHNIQUE: Multidetector CT imaging of the abdomen and pelvis was performed following the standard protocol without IV contrast. COMPARISON:  MRI abdomen dated 05/15/2020. CT abdomen/pelvis dated 01/25/2020. FINDINGS: Lower chest: Mild patchy bilateral lower lobe opacities, atelectasis versus pneumonia. Small bilateral pleural effusions. Cardiomegaly. Hepatobiliary: Unenhanced liver is unremarkable. Mild cholelithiasis in the gallbladder fundus (series 3/image 37), without associated inflammatory changes. No intrahepatic or extrahepatic ductal dilatation. Pancreas: Within normal limits. Spleen: Within normal limits. Adrenals/Urinary Tract: Adrenal glands are within normal limits. Bilateral renal cortical atrophy. Multiple bilateral renal lesions,  some of which are hyperdense, measuring up to 1.6 cm in the lateral interpolar left kidney (series 3/image 33) and 2.0 cm in the posterior right lower kidney (series 3/image 37). These are unchanged from the prior and favor benign hemorrhagic cysts, but are poorly characterized. Bladder is decompressed. Stomach/Bowel: Stomach is within normal limits. No evidence of bowel obstruction. Normal appendix (series 3/image 59). Vascular/Lymphatic: No evidence of abdominal aortic aneurysm. Atherosclerotic calcifications of the abdominal aorta and branch vessels. No suspicious abdominopelvic lymphadenopathy. Reproductive: Brachytherapy seeds in the prostate. Other: No abdominopelvic ascites. Musculoskeletal: Mild degenerative changes of the lumbar spine. Moderate degenerative changes of the left hip.  No focal osseous lesions. IMPRESSION: No CT findings to account for the patient's abdominal pain. Mild patchy bilateral lower lobe opacities, atelectasis versus pneumonia. Small bilateral pleural effusions. Cholelithiasis, without associated inflammatory changes. Brachytherapy seeds in the prostate. No findings suspicious for metastatic disease. Electronically Signed   By: Julian Hy M.D.   On: 11/30/2020 16:57   IR US Guide Vasc Access Right  Result Date: 11/12/2020 INDICATION: End-stage renal disease, occluded right upper arm loop graft EXAM: ULTRASOUND GUIDANCE FOR VASCULAR ACCESS AV GRAFT THROMBOLYSIS AND THROMBECTOMY TO RESTORE FLOW MEDICATIONS: 1% LIDOCAINE LOCAL ANESTHESIA/SEDATION: 1 mg Versed, 50 mcg fentanyl Moderate Sedation Time:  25 minutes The patient was continuously monitored during the procedure by the interventional radiology nurse under my direct supervision. FLUOROSCOPY TIME:  Fluoroscopy Time: 3 minutes 0 seconds (22 mGy). COMPLICATIONS: None immediate. PROCEDURE: Informed written consent was obtained from the patient after a thorough discussion of the procedural risks, benefits and alternatives.  All questions were addressed. Maximal Sterile Barrier Technique was utilized including caps, mask, sterile gowns, sterile gloves, sterile drape, hand hygiene and skin antiseptic. A timeout was performed prior to the initiation of the procedure. Under sterile conditions and local anesthesia, ultrasound micropuncture access performed of the right upper arm AV graft along the arterial limb. Two access sites were performed on the arterial side for placement of a 7 French sheath and a 6 French sheath directed towards each other. Ultrasound images obtained for documentation. Contrast injection confirms thrombosis. Kumpe catheter advanced across the venous outflow stent. Outflow venogram confirms patency of the central veins. Pull-back venogram confirms thrombotic occlusion of the stented outflow and the entire loop graft. 2 mg tPA instilled thrombo lysis. Teratoma thrombectomy device utilized throughout the entire loop graft as well as the outflow venous stent for thrombectomy. To reestablish inflow, the 5 [place] Fogarty catheter was advanced across the arterial anastomosis 3 times. Fogarty embolectomy performed of the arterial plug. This re-established inflow. Both sheaths were back bled and syringe aspirated. Clot fragments were removed. Following intervention, there was return of brisk flow through the sheaths. Final shuntogram performed. Following thrombo lysis and thrombectomy, the upper arm loop graft is widely patent. No significant residual stenosis. Outflow vein stent is patent. Central innominate venous stenosis noted with early jugular collateral flow. Left IJ dialysis catheter tip SVC level. Sheath removed. Hemostasis obtained with pursestring suture. No immediate complication. Patient tolerated the procedure well. IMPRESSION: Successful right upper arm AV loop graft thrombo lysis and thrombectomy to restore flow. ACCESS: This access has required 2 salvage declot interventions and venous outflow stent  placement within 4 weeks. When the access reoccludes, recommend surgical evaluation. Electronically Signed   By: Jerilynn Mages.  Shick M.D.   On: 11/12/2020 14:54   DG Chest Port 1 View  Result Date: 11/30/2020 CLINICAL DATA:  Fever. EXAM: PORTABLE CHEST 1 VIEW COMPARISON:  August 13, 2020 FINDINGS: Multiple leads project over the patient's chest. LEFT-sided dialysis catheter terminates at the caval to atrial junction as before. Heart size is enlarged with signs of central pulmonary vascular congestion and increased interstitial markings. Basilar airspace disease bilaterally. Graded opacity along the inferior LEFT and RIGHT chest. On limited assessment no acute skeletal process. IMPRESSION: 1. Findings most suggestive of volume overload and or heart failure. 2. Basilar airspace disease likely atelectasis. 3. Pattern of disease in the chest could also be seen in the setting of atypical infection with some patchy areas of opacity at the bases bilaterally. Electronically Signed   By: Zetta Bills  M.D.   On: 11/30/2020 14:33   IR THROMBECTOMY AV FISTULA W/THROMBOLYSIS INC/SHUNT/IMG RIGHT  Result Date: 11/12/2020 INDICATION: End-stage renal disease, occluded right upper arm loop graft EXAM: ULTRASOUND GUIDANCE FOR VASCULAR ACCESS AV GRAFT THROMBOLYSIS AND THROMBECTOMY TO RESTORE FLOW MEDICATIONS: 1% LIDOCAINE LOCAL ANESTHESIA/SEDATION: 1 mg Versed, 50 mcg fentanyl Moderate Sedation Time:  25 minutes The patient was continuously monitored during the procedure by the interventional radiology nurse under my direct supervision. FLUOROSCOPY TIME:  Fluoroscopy Time: 3 minutes 0 seconds (22 mGy). COMPLICATIONS: None immediate. PROCEDURE: Informed written consent was obtained from the patient after a thorough discussion of the procedural risks, benefits and alternatives. All questions were addressed. Maximal Sterile Barrier Technique was utilized including caps, mask, sterile gowns, sterile gloves, sterile drape, hand hygiene  and skin antiseptic. A timeout was performed prior to the initiation of the procedure. Under sterile conditions and local anesthesia, ultrasound micropuncture access performed of the right upper arm AV graft along the arterial limb. Two access sites were performed on the arterial side for placement of a 7 French sheath and a 6 French sheath directed towards each other. Ultrasound images obtained for documentation. Contrast injection confirms thrombosis. Kumpe catheter advanced across the venous outflow stent. Outflow venogram confirms patency of the central veins. Pull-back venogram confirms thrombotic occlusion of the stented outflow and the entire loop graft. 2 mg tPA instilled thrombo lysis. Teratoma thrombectomy device utilized throughout the entire loop graft as well as the outflow venous stent for thrombectomy. To reestablish inflow, the 5 [place] Fogarty catheter was advanced across the arterial anastomosis 3 times. Fogarty embolectomy performed of the arterial plug. This re-established inflow. Both sheaths were back bled and syringe aspirated. Clot fragments were removed. Following intervention, there was return of brisk flow through the sheaths. Final shuntogram performed. Following thrombo lysis and thrombectomy, the upper arm loop graft is widely patent. No significant residual stenosis. Outflow vein stent is patent. Central innominate venous stenosis noted with early jugular collateral flow. Left IJ dialysis catheter tip SVC level. Sheath removed. Hemostasis obtained with pursestring suture. No immediate complication. Patient tolerated the procedure well. IMPRESSION: Successful right upper arm AV loop graft thrombo lysis and thrombectomy to restore flow. ACCESS: This access has required 2 salvage declot interventions and venous outflow stent placement within 4 weeks. When the access reoccludes, recommend surgical evaluation. Electronically Signed   By: Jerilynn Mages.  Shick M.D.   On: 11/12/2020 14:54    ECHOCARDIOGRAM COMPLETE  Result Date: 12/01/2020    ECHOCARDIOGRAM REPORT   Patient Name:   ARVEL OQUINN [facility] Date of Exam: 12/01/2020 Medical Rec #:  [id number]            Height:       74.0 in Accession #:    [id number]           Weight:       193.0 lb Date of Birth:  05/09/1941            BSA:          2.141 m Patient Age:    79 years             BP:           124/62 mmHg Patient Gender: M                    HR:           83 bpm. Exam Location:  Inpatient Procedure: 2D Echo, Color Doppler and Cardiac Doppler Indications:  I50.23 Acute on chronic systolic (congestive) heart failure  History:        Patient has prior history of Echocardiogram examinations, most                 recent 03/04/2019. Stroke, Arrythmias:Atrial Fibrillation,                 Signs/Symptoms:Murmur; Risk Factors:Hypertension, Dyslipidemia                 and Sleep Apnea. Cancer. CKD. Cardiac Arrest. COVID-19.  Sonographer:    Jonelle Sidle Dance Referring Phys: [id number] [place]  1. Left ventricular ejection fraction, by estimation, is 20 to 25%. The left ventricle has severely decreased function. The left ventricle demonstrates global hypokinesis. The left ventricular internal cavity size was moderately dilated. There is mild left ventricular hypertrophy. Left ventricular diastolic parameters are consistent with Grade II diastolic dysfunction (pseudonormalization). Elevated left atrial pressure.  2. Right ventricular systolic function is normal. The right ventricular size is normal. Tricuspid regurgitation signal is inadequate for assessing PA pressure.  3. Left atrial size was mildly dilated.  4. The mitral valve is normal in structure. Moderate mitral valve regurgitation.  5. The aortic valve was not well visualized. Aortic valve regurgitation is mild to moderate. Mild to moderate aortic valve sclerosis/calcification is present, without any evidence of aortic stenosis.  6. Aortic dilatation noted. Aneurysm of the  ascending aorta, measuring 47 mm.  7. The inferior vena cava is normal in size with greater than 50% respiratory variability, suggesting right atrial pressure of 3 mmHg. FINDINGS  Left Ventricle: Left ventricular ejection fraction, by estimation, is 20 to 25%. The left ventricle has severely decreased function. The left ventricle demonstrates global hypokinesis. The left ventricular internal cavity size was moderately dilated. There is mild left ventricular hypertrophy. Left ventricular diastolic parameters are consistent with Grade II diastolic dysfunction (pseudonormalization). Elevated left atrial pressure. Right Ventricle: The right ventricular size is normal. Right vetricular wall thickness was not well visualized. Right ventricular systolic function is normal. Tricuspid regurgitation signal is inadequate for assessing PA pressure. Left Atrium: Left atrial size was mildly dilated. Right Atrium: Right atrial size was normal in size. Pericardium: There is no evidence of pericardial effusion. Mitral Valve: The mitral valve is normal in structure. Moderate mitral valve regurgitation. Tricuspid Valve: The tricuspid valve is normal in structure. Tricuspid valve regurgitation is trivial. Aortic Valve: The aortic valve was not well visualized. Aortic valve regurgitation is mild to moderate. Aortic regurgitation PHT measures 383 msec. Mild to moderate aortic valve sclerosis/calcification is present, without any evidence of aortic stenosis. Pulmonic Valve: The pulmonic valve was not well visualized. Pulmonic valve regurgitation is not visualized. Aorta: The aortic root is normal in size and structure and aortic dilatation noted. There is an aneurysm involving the ascending aorta measuring 47 mm. Venous: The inferior vena cava is normal in size with greater than 50% respiratory variability, suggesting right atrial pressure of 3 mmHg. IAS/Shunts: The interatrial septum was not well visualized.  LEFT VENTRICLE PLAX 2D  LVIDd:         6.80 cm  Diastology LVIDs:         6.10 cm  LV e' medial:    4.79 cm/s LV PW:         1.20 cm  LV E/e' medial:  24.6 LV IVS:        0.80 cm  LV e' lateral:   5.11 cm/s LVOT diam:  2.30 cm  LV E/e' lateral: 23.1 LV SV:         90 LV SV Index:   42 LVOT Area:     4.15 cm  RIGHT VENTRICLE             IVC RV Basal diam:  2.60 cm     IVC diam: 1.90 cm RV S prime:     13.50 cm/s TAPSE (M-mode): 2.2 cm LEFT ATRIUM             Index       RIGHT ATRIUM           Index LA diam:        4.50 cm 2.10 cm/m  RA Area:     16.50 cm LA Vol (A2C):   91.0 ml 42.51 ml/m RA Volume:   44.80 ml  20.93 ml/m LA Vol (A4C):   83.9 ml 39.19 ml/m LA Biplane Vol: 87.2 ml 40.73 ml/m  AORTIC VALVE LVOT Vmax:   115.00 cm/s LVOT Vmean:  71.600 cm/s LVOT VTI:    0.216 m AI PHT:      383 msec  AORTA Ao Root diam: 3.60 cm Ao Asc diam:  4.70 cm MITRAL VALVE MV Area (PHT): 3.77 cm      SHUNTS MV Decel Time: 201 msec      Systemic VTI:  0.22 m MR Peak grad:    75.7 mmHg   Systemic Diam: 2.30 cm MR Mean grad:    49.0 mmHg MR Vmax:         435.00 cm/s MR Vmean:        325.0 cm/s MR PISA:         1.01 cm MR PISA Eff ROA: 9 mm MR PISA Radius:  0.40 cm MV E velocity: 118.00 cm/s MV A velocity: 104.00 cm/s MV E/A ratio:  1.13 Oswaldo Milian MD Electronically signed by Oswaldo Milian MD Signature Date/Time: 12/01/2020/1:22:39 PM    Final     Subjective: He is feeling better, dyspnea improved. He decline to go to SNF, wants to go home.   Discharge Exam: Vitals:   12/02/20 2029 12/03/20 0806  BP: (!) 127/59 129/65  Pulse:  94  Resp: 16 17  Temp: 98 F (36.7 C) 98.5 F (36.9 C)  SpO2: 98% 100%     General: Pt is alert, awake, not in acute distress Cardiovascular: RRR, S1/S2 +, no rubs, no gallops Respiratory: CTA bilaterally, no wheezing, no rhonchi Abdominal: Soft, NT, ND, bowel sounds + Extremities: no edema, no cyanosis    The results of significant diagnostics from this hospitalization (including  imaging, microbiology, ancillary and laboratory) are listed below for reference.     Microbiology: Recent Results (from the past 240 hour(s))  Resp Panel by RT-PCR (Flu A&B, Covid) Nasopharyngeal Swab     Status: None   Collection Time: 11/30/20  2:04 PM   Specimen: Nasopharyngeal Swab; Nasopharyngeal(NP) swabs in vial transport medium  Result Value Ref Range Status   SARS Coronavirus 2 by RT PCR NEGATIVE NEGATIVE Final    Comment: (NOTE) SARS-CoV-2 target nucleic acids are NOT DETECTED.  The SARS-CoV-2 RNA is generally detectable in upper respiratory specimens during the acute phase of infection. The lowest concentration of SARS-CoV-2 viral copies this assay can detect is 138 copies/mL. A negative result does not preclude SARS-Cov-2 infection and should not be used as the sole basis for treatment or other patient management decisions. A negative result may occur with  improper specimen collection/handling,  submission of specimen other than nasopharyngeal swab, presence of viral mutation(s) within the areas targeted by this assay, and inadequate number of viral copies(<138 copies/mL). A negative result must be combined with clinical observations, patient history, and epidemiological information. The expected result is Negative.  Fact Sheet for Patients:  EntrepreneurPulse.com.au  Fact Sheet for Healthcare Providers:  IncredibleEmployment.be  This test is no t yet approved or cleared by the [place] FDA and  has been authorized for detection and/or diagnosis of SARS-CoV-2 by FDA under an Emergency Use Authorization (EUA). This EUA will remain  in effect (meaning this test can be used) for the duration of the COVID-19 declaration under Section 564(b)(1) of the Act, 21 U.S.C.section 360bbb-3(b)(1), unless the authorization is terminated  or revoked sooner.       Influenza A by PCR NEGATIVE NEGATIVE Final   Influenza B by PCR NEGATIVE  NEGATIVE Final    Comment: (NOTE) The Xpert Xpress SARS-CoV-2/FLU/RSV plus assay is intended as an aid in the diagnosis of influenza from Nasopharyngeal swab specimens and should not be used as a sole basis for treatment. Nasal washings and aspirates are unacceptable for Xpert Xpress SARS-CoV-2/FLU/RSV testing.  Fact Sheet for Patients: EntrepreneurPulse.com.au  Fact Sheet for Healthcare Providers: IncredibleEmployment.be  This test is not yet approved or cleared by the [place] FDA and has been authorized for detection and/or diagnosis of SARS-CoV-2 by FDA under an Emergency Use Authorization (EUA). This EUA will remain in effect (meaning this test can be used) for the duration of the COVID-19 declaration under Section 564(b)(1) of the Act, 21 U.S.C. section 360bbb-3(b)(1), unless the authorization is terminated or revoked.  Performed at [facility], [place] [address], [place], [place] [address]   Blood culture (routine x 2)     Status: None (Preliminary result)   Collection Time: 11/30/20  2:30 PM   Specimen: BLOOD LEFT HAND  Result Value Ref Range Status   Specimen Description BLOOD LEFT HAND  Final   Special Requests   Final    BOTTLES DRAWN AEROBIC AND ANAEROBIC Blood Culture adequate volume   Culture   Final    NO GROWTH 3 DAYS Performed at [facility], [place] [address], [place], [place] [address]    Report Status PENDING  Incomplete  Culture, Urine     Status: Abnormal   Collection Time: 11/30/20 11:54 PM   Specimen: Urine, Random  Result Value Ref Range Status   Specimen Description URINE, RANDOM  Final   Special Requests   Final    NONE Performed at [facility], [place] [address] [place], [place] [address]    Culture MULTIPLE SPECIES PRESENT, SUGGEST RECOLLECTION (A)  Final   Report Status 12/02/2020 FINAL  Final  MRSA PCR Screening     Status: None   Collection Time: 12/01/20  8:21 PM   Specimen: Nasal  Mucosa; Nasopharyngeal  Result Value Ref Range Status   MRSA by PCR NEGATIVE NEGATIVE Final    Comment:        The GeneXpert MRSA Assay (FDA approved for NASAL specimens only), is one component of a comprehensive MRSA colonization surveillance program. It is not intended to diagnose MRSA infection nor to guide or monitor treatment for MRSA infections. Performed at [facility], [place] [address] [place], [place] [address]      Labs: BNP (last 3 results) Recent Labs    01/08/20 1032 01/09/20 0222 11/30/20 1430  BNP 890.8* 641.5* 2,788.5*  Basic Metabolic Panel: Recent Labs  Lab 11/30/20 1430 12/01/20 0500 12/03/20 0617  NA 140 141  --   K 3.4* 3.7  --   CL 90* 94*  --   CO2 33* 34*  --   GLUCOSE 132* 102*  --   BUN 11 23  --   CREATININE 4.55* 6.02*  --   CALCIUM 9.0 8.9  --   MG  --   --  2.0   Liver Function Tests: Recent Labs  Lab 11/30/20 1430 12/01/20 0500  AST 23 20  ALT 25 22  ALKPHOS 100 84  BILITOT 0.7 0.5  PROT 6.9 6.4*  ALBUMIN 3.1* 2.8*   No results for input(s): LIPASE, AMYLASE in the last 168 hours. No results for input(s): AMMONIA in the last 168 hours. CBC: Recent Labs  Lab 11/30/20 1430 12/01/20 0500  WBC 13.6* 9.5  NEUTROABS 11.6*  --   HGB 9.6* 9.1*  HCT 29.4* 28.7*  MCV 108.5* 109.1*  PLT 220 199   Cardiac Enzymes: No results for input(s): CKTOTAL, CKMB, CKMBINDEX, TROPONINI in the last 168 hours. BNP: Invalid input(s): POCBNP CBG: Recent Labs  Lab 11/30/20 1455  GLUCAP 121*   D-Dimer No results for input(s): DDIMER in the last 72 hours. Hgb A1c No results for input(s): HGBA1C in the last 72 hours. Lipid Profile No results for input(s): CHOL, HDL, LDLCALC, TRIG, CHOLHDL, LDLDIRECT in the last 72 hours. Thyroid function studies No results for input(s): TSH, T4TOTAL, T3FREE, THYROIDAB in the last 72 hours.  Invalid input(s): FREET3 Anemia work up Recent Labs    12/03/20 0617  VITAMINB12 1,284*    Urinalysis    Component Value Date/Time   COLORURINE YELLOW 11/30/2020 2354   APPEARANCEUR CLOUDY (A) 11/30/2020 2354   LABSPEC 1.011 11/30/2020 2354   PHURINE 9.0 (H) 11/30/2020 2354   GLUCOSEU NEGATIVE 11/30/2020 2354   HGBUR SMALL (A) 11/30/2020 2354   BILIRUBINUR NEGATIVE 11/30/2020 2354   BILIRUBINUR negative 04/16/2013 1447   KETONESUR NEGATIVE 11/30/2020 2354   PROTEINUR 100 (A) 11/30/2020 2354   UROBILINOGEN 0.2 05/11/2014 1352   NITRITE NEGATIVE 11/30/2020 2354   LEUKOCYTESUR LARGE (A) 11/30/2020 2354   Sepsis Labs Invalid input(s): PROCALCITONIN,  WBC,  LACTICIDVEN Microbiology Recent Results (from the past 240 hour(s))  Resp Panel by RT-PCR (Flu A&B, Covid) Nasopharyngeal Swab     Status: None   Collection Time: 11/30/20  2:04 PM   Specimen: Nasopharyngeal Swab; Nasopharyngeal(NP) swabs in vial transport medium  Result Value Ref Range Status   SARS Coronavirus 2 by RT PCR NEGATIVE NEGATIVE Final    Comment: (NOTE) SARS-CoV-2 target nucleic acids are NOT DETECTED.  The SARS-CoV-2 RNA is generally detectable in upper respiratory specimens during the acute phase of infection. The lowest concentration of SARS-CoV-2 viral copies this assay can detect is 138 copies/mL. A negative result does not preclude SARS-Cov-2 infection and should not be used as the sole basis for treatment or other patient management decisions. A negative result may occur with  improper specimen collection/handling, submission of specimen other than nasopharyngeal swab, presence of viral mutation(s) within the areas targeted by this assay, and inadequate number of viral copies(<138 copies/mL). A negative result must be combined with clinical observations, patient history, and epidemiological information. The expected result is Negative.  Fact Sheet for Patients:  EntrepreneurPulse.com.au  Fact Sheet for Healthcare Providers:   IncredibleEmployment.be  This test is no t yet approved or cleared by the [place] and  has been authorized  for detection and/or diagnosis of SARS-CoV-2 by FDA under an Emergency Use Authorization (EUA). This EUA will remain  in effect (meaning this test can be used) for the duration of the COVID-19 declaration under Section 564(b)(1) of the Act, 21 U.S.C.section 360bbb-3(b)(1), unless the authorization is terminated  or revoked sooner.       Influenza A by PCR NEGATIVE NEGATIVE Final   Influenza B by PCR NEGATIVE NEGATIVE Final    Comment: (NOTE) The Xpert Xpress SARS-CoV-2/FLU/RSV plus assay is intended as an aid in the diagnosis of influenza from Nasopharyngeal swab specimens and should not be used as a sole basis for treatment. Nasal washings and aspirates are unacceptable for Xpert Xpress SARS-CoV-2/FLU/RSV testing.  Fact Sheet for Patients: EntrepreneurPulse.com.au  Fact Sheet for Healthcare Providers: IncredibleEmployment.be  This test is not yet approved or cleared by the [place] FDA and has been authorized for detection and/or diagnosis of SARS-CoV-2 by FDA under an Emergency Use Authorization (EUA). This EUA will remain in effect (meaning this test can be used) for the duration of the COVID-19 declaration under Section 564(b)(1) of the Act, 21 U.S.C. section 360bbb-3(b)(1), unless the authorization is terminated or revoked.  Performed at [facility], [place] [address], [place], [place] [address]   Blood culture (routine x 2)     Status: None (Preliminary result)   Collection Time: 11/30/20  2:30 PM   Specimen: BLOOD LEFT HAND  Result Value Ref Range Status   Specimen Description BLOOD LEFT HAND  Final   Special Requests   Final    BOTTLES DRAWN AEROBIC AND ANAEROBIC Blood Culture adequate volume   Culture   Final    NO GROWTH 3 DAYS Performed at [facility], [place] [address],  [place], [place] [address]    Report Status PENDING  Incomplete  Culture, Urine     Status: Abnormal   Collection Time: 11/30/20 11:54 PM   Specimen: Urine, Random  Result Value Ref Range Status   Specimen Description URINE, RANDOM  Final   Special Requests   Final    NONE Performed at [facility], [place] [address], [place], [place] [address]    Culture MULTIPLE SPECIES PRESENT, SUGGEST RECOLLECTION (A)  Final   Report Status 12/02/2020 FINAL  Final  MRSA PCR Screening     Status: None   Collection Time: 12/01/20  8:21 PM   Specimen: Nasal Mucosa; Nasopharyngeal  Result Value Ref Range Status   MRSA by PCR NEGATIVE NEGATIVE Final    Comment:        The GeneXpert MRSA Assay (FDA approved for NASAL specimens only), is one component of a comprehensive MRSA colonization surveillance program. It is not intended to diagnose MRSA infection nor to guide or monitor treatment for MRSA infections. Performed at [facility], [place] [address] [place], [place] [address]      Time coordinating discharge: 40 minutes  SIGNED:   Elmarie Shiley, MD  [facility]

## 2020-12-03 NOTE — Care Management Important Message (Signed)
Important Message  Patient Details  Name: David [name] MRN: [id number] Date of Birth: [birth year]   Medicare Important Message Given:  Yes     [name] [name] 12/03/2020, 2:53 PM

## 2020-12-03 NOTE — Care Management Important Message (Signed)
Important Message  Patient Details  Name: David [name] MRN: [id number] Date of Birth: 01-29-1941   Medicare Important Message Given:  Yes     David [name] [name] 12/03/2020, 2:59 PM

## 2020-12-03 NOTE — Progress Notes (Signed)
Pt was ambulating with PT to the bathroom. No complaints. Discharge instructions was given to pt and his wife. Discharged to home.

## 2020-12-05 ENCOUNTER — Telehealth: Payer: Self-pay | Admitting: Family

## 2020-12-05 ENCOUNTER — Telehealth: Payer: Self-pay | Admitting: Physician Assistant

## 2020-12-05 DIAGNOSIS — N186 End stage renal disease: Secondary | ICD-10-CM | POA: Diagnosis not present

## 2020-12-05 DIAGNOSIS — N2581 Secondary hyperparathyroidism of renal origin: Secondary | ICD-10-CM | POA: Diagnosis not present

## 2020-12-05 DIAGNOSIS — Z992 Dependence on renal dialysis: Secondary | ICD-10-CM | POA: Diagnosis not present

## 2020-12-05 LAB — CULTURE, BLOOD (ROUTINE X 2)
Culture: NO GROWTH
Special Requests: ADEQUATE

## 2020-12-05 NOTE — Telephone Encounter (Signed)
Please contact pt to schedule a post hospital follow up visit.

## 2020-12-05 NOTE — Telephone Encounter (Signed)
Transition of care contact from inpatient facility  Date of discharge: 12/03/20 Date of contact: 12/06/19 Method: Phone Spoke to: Patient  Patient contacted to discuss transition of care from recent inpatient hospitalization. Patient was admitted to [facility] from 11/30/20-12/03/20 with discharge diagnosis of bilateral pneumonia and volume overload.  Medication changes were reviewed. Pt reports he has all of his medications Home health has been ordered. Patient declined SNF. Reports he will follow up with ID, PCP and cardiology as recommended  Patient will follow up with his/her outpatient HD unit on: Today   Anice Paganini, PA-C 12/05/2020, 2:12 PM  [employer]

## 2020-12-06 ENCOUNTER — Telehealth: Payer: Self-pay

## 2020-12-06 NOTE — Telephone Encounter (Signed)
1st attempt TCM call. No answer. 

## 2020-12-06 NOTE — Telephone Encounter (Signed)
Erroneous encounter

## 2020-12-07 ENCOUNTER — Telehealth: Payer: Self-pay

## 2020-12-07 DIAGNOSIS — N186 End stage renal disease: Secondary | ICD-10-CM | POA: Diagnosis not present

## 2020-12-07 DIAGNOSIS — N2581 Secondary hyperparathyroidism of renal origin: Secondary | ICD-10-CM | POA: Diagnosis not present

## 2020-12-07 DIAGNOSIS — Z992 Dependence on renal dialysis: Secondary | ICD-10-CM | POA: Diagnosis not present

## 2020-12-07 NOTE — Telephone Encounter (Signed)
Left message to schedule appointment

## 2020-12-07 NOTE — Telephone Encounter (Signed)
Transition Care Management Follow-up Telephone Call  Date of discharge and from where: 12/03/2020-[facility]  How have you been since you were released from the hospital? Alright-no complaints  Any questions or concerns? No  Items Reviewed:  Did the pt receive and understand the discharge instructions provided? Yes   Medications obtained and verified? Yes   Other? Yes   Any new allergies since your discharge? No   Dietary orders reviewed? Yes  Do you have support at home? Yes   Home Care and Equipment/Supplies: Were home health services ordered? yes If so, what is the name of the agency? [facility]  Has the agency set up a time to come to the patient's home? yes Were any new equipment or medical supplies ordered?  No What is the name of the medical supply agency? n/a Were you able to get the supplies/equipment? not applicable Do you have any questions related to the use of the equipment or supplies? n/a  Functional Questionnaire: (I = Independent and D = Dependent) ADLs: I  Bathing/Dressing- I  Meal Prep- I  Eating- I  Maintaining continence- I  Transferring/Ambulation- I  Managing Meds- I  Follow up appointments reviewed:   PCP Hospital f/u appt confirmed? Yes  Scheduled to see [name] on 12/15/20 @ 12:40.  [facility] f/u appt confirmed? No  Patient waiting on calls from cardiology & infectious disease  Are transportation arrangements needed? No   If their condition worsens, is the pt aware to call PCP or go to the Emergency Dept.? Yes  Was the patient provided with contact information for the PCP's office or ED? Yes  Was to pt encouraged to call back with questions or concerns? Yes

## 2020-12-08 ENCOUNTER — Telehealth: Payer: Self-pay | Admitting: Family

## 2020-12-08 DIAGNOSIS — D509 Iron deficiency anemia, unspecified: Secondary | ICD-10-CM | POA: Diagnosis not present

## 2020-12-08 DIAGNOSIS — N186 End stage renal disease: Secondary | ICD-10-CM | POA: Diagnosis not present

## 2020-12-08 DIAGNOSIS — J440 Chronic obstructive pulmonary disease with acute lower respiratory infection: Secondary | ICD-10-CM | POA: Diagnosis not present

## 2020-12-08 DIAGNOSIS — D631 Anemia in chronic kidney disease: Secondary | ICD-10-CM | POA: Diagnosis not present

## 2020-12-08 DIAGNOSIS — I132 Hypertensive heart and chronic kidney disease with heart failure and with stage 5 chronic kidney disease, or end stage renal disease: Secondary | ICD-10-CM | POA: Diagnosis not present

## 2020-12-08 DIAGNOSIS — I0981 Rheumatic heart failure: Secondary | ICD-10-CM | POA: Diagnosis not present

## 2020-12-08 DIAGNOSIS — I083 Combined rheumatic disorders of mitral, aortic and tricuspid valves: Secondary | ICD-10-CM | POA: Diagnosis not present

## 2020-12-08 DIAGNOSIS — M1030 Gout due to renal impairment, unspecified site: Secondary | ICD-10-CM | POA: Diagnosis not present

## 2020-12-08 DIAGNOSIS — I5043 Acute on chronic combined systolic (congestive) and diastolic (congestive) heart failure: Secondary | ICD-10-CM | POA: Diagnosis not present

## 2020-12-08 NOTE — Telephone Encounter (Signed)
David [name] with [facility]  Call Back # [phone]  Requesting verbal orders for physical therapy   Frequency :         1x1         2x3         1x3  Also home health with nursing aide   Frenquency:         2X3

## 2020-12-08 NOTE — Telephone Encounter (Signed)
Spoke w/ Bavin-verbal orders given.

## 2020-12-09 DIAGNOSIS — N2581 Secondary hyperparathyroidism of renal origin: Secondary | ICD-10-CM | POA: Diagnosis not present

## 2020-12-09 DIAGNOSIS — Z992 Dependence on renal dialysis: Secondary | ICD-10-CM | POA: Diagnosis not present

## 2020-12-09 DIAGNOSIS — N186 End stage renal disease: Secondary | ICD-10-CM | POA: Diagnosis not present

## 2020-12-10 DIAGNOSIS — I5043 Acute on chronic combined systolic (congestive) and diastolic (congestive) heart failure: Secondary | ICD-10-CM | POA: Diagnosis not present

## 2020-12-10 DIAGNOSIS — I132 Hypertensive heart and chronic kidney disease with heart failure and with stage 5 chronic kidney disease, or end stage renal disease: Secondary | ICD-10-CM | POA: Diagnosis not present

## 2020-12-10 DIAGNOSIS — D509 Iron deficiency anemia, unspecified: Secondary | ICD-10-CM | POA: Diagnosis not present

## 2020-12-10 DIAGNOSIS — I083 Combined rheumatic disorders of mitral, aortic and tricuspid valves: Secondary | ICD-10-CM | POA: Diagnosis not present

## 2020-12-10 DIAGNOSIS — N186 End stage renal disease: Secondary | ICD-10-CM | POA: Diagnosis not present

## 2020-12-10 DIAGNOSIS — D631 Anemia in chronic kidney disease: Secondary | ICD-10-CM | POA: Diagnosis not present

## 2020-12-10 DIAGNOSIS — J440 Chronic obstructive pulmonary disease with acute lower respiratory infection: Secondary | ICD-10-CM | POA: Diagnosis not present

## 2020-12-10 DIAGNOSIS — I0981 Rheumatic heart failure: Secondary | ICD-10-CM | POA: Diagnosis not present

## 2020-12-10 DIAGNOSIS — M1030 Gout due to renal impairment, unspecified site: Secondary | ICD-10-CM | POA: Diagnosis not present

## 2020-12-11 DIAGNOSIS — N186 End stage renal disease: Secondary | ICD-10-CM | POA: Diagnosis not present

## 2020-12-11 DIAGNOSIS — N2581 Secondary hyperparathyroidism of renal origin: Secondary | ICD-10-CM | POA: Diagnosis not present

## 2020-12-11 DIAGNOSIS — Z992 Dependence on renal dialysis: Secondary | ICD-10-CM | POA: Diagnosis not present

## 2020-12-13 DIAGNOSIS — Z992 Dependence on renal dialysis: Secondary | ICD-10-CM | POA: Diagnosis not present

## 2020-12-13 DIAGNOSIS — N2581 Secondary hyperparathyroidism of renal origin: Secondary | ICD-10-CM | POA: Diagnosis not present

## 2020-12-13 DIAGNOSIS — N186 End stage renal disease: Secondary | ICD-10-CM | POA: Diagnosis not present

## 2020-12-14 DIAGNOSIS — N2581 Secondary hyperparathyroidism of renal origin: Secondary | ICD-10-CM | POA: Diagnosis not present

## 2020-12-14 DIAGNOSIS — N186 End stage renal disease: Secondary | ICD-10-CM | POA: Diagnosis not present

## 2020-12-14 DIAGNOSIS — I132 Hypertensive heart and chronic kidney disease with heart failure and with stage 5 chronic kidney disease, or end stage renal disease: Secondary | ICD-10-CM | POA: Diagnosis not present

## 2020-12-14 DIAGNOSIS — I5043 Acute on chronic combined systolic (congestive) and diastolic (congestive) heart failure: Secondary | ICD-10-CM | POA: Diagnosis not present

## 2020-12-14 DIAGNOSIS — I083 Combined rheumatic disorders of mitral, aortic and tricuspid valves: Secondary | ICD-10-CM | POA: Diagnosis not present

## 2020-12-14 DIAGNOSIS — Z992 Dependence on renal dialysis: Secondary | ICD-10-CM | POA: Diagnosis not present

## 2020-12-14 DIAGNOSIS — D631 Anemia in chronic kidney disease: Secondary | ICD-10-CM | POA: Diagnosis not present

## 2020-12-14 DIAGNOSIS — I0981 Rheumatic heart failure: Secondary | ICD-10-CM | POA: Diagnosis not present

## 2020-12-14 DIAGNOSIS — J440 Chronic obstructive pulmonary disease with acute lower respiratory infection: Secondary | ICD-10-CM | POA: Diagnosis not present

## 2020-12-14 DIAGNOSIS — D509 Iron deficiency anemia, unspecified: Secondary | ICD-10-CM | POA: Diagnosis not present

## 2020-12-14 DIAGNOSIS — M1030 Gout due to renal impairment, unspecified site: Secondary | ICD-10-CM | POA: Diagnosis not present

## 2020-12-15 ENCOUNTER — Encounter (HOSPITAL_COMMUNITY): Payer: Self-pay

## 2020-12-15 ENCOUNTER — Emergency Department (HOSPITAL_COMMUNITY)
Admission: EM | Admit: 2020-12-15 | Discharge: 2020-12-15 | Disposition: A | Discharge status: Home / Self Care | Payer: Medicare HMO | Attending: Emergency Medicine | Admitting: Emergency Medicine

## 2020-12-15 ENCOUNTER — Telehealth: Payer: Self-pay

## 2020-12-15 ENCOUNTER — Other Ambulatory Visit (HOSPITAL_COMMUNITY): Payer: Medicare HMO

## 2020-12-15 ENCOUNTER — Emergency Department (HOSPITAL_COMMUNITY): Payer: Medicare HMO

## 2020-12-15 ENCOUNTER — Inpatient Hospital Stay: Payer: Medicare HMO | Admitting: Family

## 2020-12-15 ENCOUNTER — Ambulatory Visit: Payer: Medicare HMO | Admitting: Vascular Surgery

## 2020-12-15 ENCOUNTER — Inpatient Hospital Stay (HOSPITAL_COMMUNITY): Admission: RE | Admit: 2020-12-15 | Payer: Medicare HMO | Source: Ambulatory Visit

## 2020-12-15 DIAGNOSIS — I2510 Atherosclerotic heart disease of native coronary artery without angina pectoris: Secondary | ICD-10-CM | POA: Insufficient documentation

## 2020-12-15 DIAGNOSIS — Z79899 Other long term (current) drug therapy: Secondary | ICD-10-CM | POA: Insufficient documentation

## 2020-12-15 DIAGNOSIS — I132 Hypertensive heart and chronic kidney disease with heart failure and with stage 5 chronic kidney disease, or end stage renal disease: Secondary | ICD-10-CM | POA: Insufficient documentation

## 2020-12-15 DIAGNOSIS — S79912A Unspecified injury of left hip, initial encounter: Secondary | ICD-10-CM | POA: Diagnosis not present

## 2020-12-15 DIAGNOSIS — Z8616 Personal history of COVID-19: Secondary | ICD-10-CM | POA: Diagnosis not present

## 2020-12-15 DIAGNOSIS — Z21 Asymptomatic human immunodeficiency virus [HIV] infection status: Secondary | ICD-10-CM | POA: Diagnosis not present

## 2020-12-15 DIAGNOSIS — I5042 Chronic combined systolic (congestive) and diastolic (congestive) heart failure: Secondary | ICD-10-CM | POA: Diagnosis not present

## 2020-12-15 DIAGNOSIS — I119 Hypertensive heart disease without heart failure: Secondary | ICD-10-CM | POA: Insufficient documentation

## 2020-12-15 DIAGNOSIS — N186 End stage renal disease: Secondary | ICD-10-CM | POA: Insufficient documentation

## 2020-12-15 DIAGNOSIS — W19XXXA Unspecified fall, initial encounter: Secondary | ICD-10-CM | POA: Insufficient documentation

## 2020-12-15 DIAGNOSIS — M25552 Pain in left hip: Secondary | ICD-10-CM | POA: Insufficient documentation

## 2020-12-15 DIAGNOSIS — Z87891 Personal history of nicotine dependence: Secondary | ICD-10-CM | POA: Diagnosis not present

## 2020-12-15 DIAGNOSIS — M533 Sacrococcygeal disorders, not elsewhere classified: Secondary | ICD-10-CM | POA: Diagnosis not present

## 2020-12-15 DIAGNOSIS — Z8546 Personal history of malignant neoplasm of prostate: Secondary | ICD-10-CM | POA: Diagnosis not present

## 2020-12-15 DIAGNOSIS — Z992 Dependence on renal dialysis: Secondary | ICD-10-CM | POA: Diagnosis not present

## 2020-12-15 DIAGNOSIS — I739 Peripheral vascular disease, unspecified: Secondary | ICD-10-CM | POA: Diagnosis not present

## 2020-12-15 DIAGNOSIS — Z955 Presence of coronary angioplasty implant and graft: Secondary | ICD-10-CM | POA: Diagnosis not present

## 2020-12-15 DIAGNOSIS — Y92002 Bathroom of unspecified non-institutional (private) residence single-family (private) house as the place of occurrence of the external cause: Secondary | ICD-10-CM | POA: Diagnosis not present

## 2020-12-15 DIAGNOSIS — Z7901 Long term (current) use of anticoagulants: Secondary | ICD-10-CM | POA: Diagnosis not present

## 2020-12-15 DIAGNOSIS — M1612 Unilateral primary osteoarthritis, left hip: Secondary | ICD-10-CM | POA: Diagnosis not present

## 2020-12-15 MED ORDER — HYDROCODONE-ACETAMINOPHEN 5-325 MG PO TABS: 1.0000 | ORAL_TABLET | Freq: Four times a day (QID) | ORAL | 0 refills | Status: DC | PRN

## 2020-12-15 MED ORDER — HYDROCODONE-ACETAMINOPHEN 5-325 MG PO TABS
1.0000 | ORAL_TABLET | Freq: Once | ORAL | Status: AC
  Administered 2020-12-15: 1 via ORAL
  Filled 2020-12-15: qty 1

## 2020-12-15 NOTE — Telephone Encounter (Signed)
Volunteer called patient on behalf of Palliative Care and did not get a answer from patient/family. ° °

## 2020-12-15 NOTE — ED Provider Notes (Signed)
[facility] EMERGENCY DEPARTMENT Provider Note   CSN: [id number] Arrival date & time: 12/15/20  4098     History Chief Complaint  Patient presents with  . [place] is a 80 y.o. male.  Patient brought in by EMS.  Patient had a mechanical fall in the bathroom and fell onto his left hip.  Unable to bear weight.  But no deformity.  Patient is on Eliquis no head injury.  Patient just states he has pain in the left hip area.  But able to move the leg well.  No other injuries        Past Medical History:  Diagnosis Date  . 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 Acute on chronic systolic and diastolic heart failure, NYHA class 4 ([place])   . Anemia, iron deficiency 11/15/2011  . Arthritis    "hands, right knee, feet" (02/21/2018)  . Atrial fibrillation ([place])   . Cardiac arrest ([place]) 02/17/2018  . CHF (congestive heart failure) ([place])   . Chronic combined systolic and diastolic heart failure, NYHA class 3 ([place]) 06/2010   felt to be secondary to hypertensive cardiomyopathy  . Chronic lower back pain   . CKD (chronic kidney disease) stage V requiring chronic dialysis ([place])   . COVID-19 12/2019  . ESRD on dialysis [facility])    started 02/2018 [place]  . GERD (gastroesophageal reflux disease)   . Glaucoma    "I had surgery and dont have it any more"  . Gout    daily RX (02/21/2018)  . Heart murmur    "mild" per pt  . Hepatitis    years ago  . HIV (human immunodeficiency virus infection) ([place])   . HIV infection ([place])   . Hyperlipidemia   . Hypertension   . Nonischemic cardiomyopathy ([place])   . Pneumonia 11/2017  . Prostate cancer ([place])    hx of prostate; s/p radioactive seed implant 10/2009 Dr Janice Norrie  . Sinus bradycardia   . Sleep apnea    does not use a cpap  . Stroke [facility])    "mini stroke" years ago  . Wears glasses     Patient Active Problem List   Diagnosis Date Noted  . Complication of vascular dialysis  catheter 09/29/2020  . Disease of gallbladder, unspecified 05/18/2020  . Elevated LFTs   . Abdominal pain   . Transaminitis 05/15/2020  . Encounter for removal of sutures 04/10/2020  . Full code status 02/03/2020  . Chest pain 01/24/2020  . Subtherapeutic international normalized ratio (INR) 01/24/2020  . Elevated troponin 01/24/2020  . Pressure injury of skin 01/07/2020  . Combined systolic and diastolic heart failure ([place])   . ESRD on dialysis ([place])   . COVID-19 12/11/2019  . Acute respiratory failure with hypoxemia ([place]) 11/01/2019  . Closed fracture of nasal bones 09/10/2019  . Hyperkalemia 09/02/2019  . Unspecified open wound of right upper arm, initial encounter 06/21/2019  . Shortness of breath 04/12/2019  . 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 Dependence on renal dialysis ([place]) 03/27/2019  . Hypertensive heart and chronic kidney disease with heart failure and with stage 5 chronic kidney disease, or end stage renal disease ([place]) 03/27/2019  . Mesenteric ischemia ([place])   . Colonic ulcer   . Right lower quadrant pain   . Abnormal CT of the abdomen   . Ischemic colitis ([place])   . Colitis presumed infectious 03/09/2019  . Acute respiratory failure with hypoxia and hypercapnia ([place]) 03/04/2019  .  Pruritus, unspecified 02/21/2019  . Acute pulmonary edema ([place]) 02/11/2019  . Pain, unspecified 01/21/2019  . Paroxysmal atrial fibrillation ([place]) 11/16/2018  . LBBB (left bundle branch block) 11/16/2018  . Encounter for monitoring amiodarone therapy 11/16/2018  . Volume overload 11/01/2018  . Pulmonary edema 10/21/2018  . Gout 10/21/2018  . Hypertensive emergency 10/21/2018  . Chronic combined systolic and diastolic heart failure, NYHA class 3 ([place]) 10/21/2018  . Leukocytosis 10/21/2018  . Chills (without fever) 09/24/2018  . Gastroesophageal reflux disease 08/28/2018  . CAD (coronary artery disease) 04/03/2018  . Palliative care encounter 03/27/2018  . Atrial fibrillation,  chronic ([place]) 03/25/2018  . Anemia in chronic kidney disease 03/12/2018  . Coagulation defect, unspecified ([place]) 03/12/2018  . Secondary hyperparathyroidism of renal origin ([place]) 03/12/2018  . Palliative care by specialist   . Advance care planning   . Goals of care, counseling/discussion   . Respiratory arrest ([place])   . Acute on chronic systolic heart failure ([place])   . Chronic obstructive pulmonary disease ([place])   . HIV (human immunodeficiency virus infection) ([place])   . ESRD (end stage renal disease) ([place])   . Acute respiratory failure with hypoxia ([place]) 01/21/2018  . Pneumonia   . Aspiration pneumonia of both lower lobes due to regurgitated food ([place])   . Sepsis due to pneumonia ([place]) 11/22/2017  . AIDS ([place]) 05/14/2014  . 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 Gastroparesis 05/06/2014  . Protein-calorie malnutrition, severe ([place]) 05/05/2014  . ESRD on hemodialysis ([place]) 05/02/2014  . Hemodialysis-associated hypotension 05/02/2014  . Leaking of conjunctival drainage bleb 05/01/2014  . POAG (primary open-angle glaucoma) 05/01/2014  . Loss of weight 04/29/2014  . Conjunctivitis 04/29/2014  . Nonischemic dilated cardiomyopathy ([place]) 09/10/2013  . Low back pain 04/09/2012  . Osteoarthritis of hip 12/29/2011  . Iron deficiency anemia 06/28/2011  . Macrocytic anemia 04/02/2011  . 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 Hyperlipidemia 11/30/2010  . Transient cerebral ischemia 11/30/2010  . HYPERGLYCEMIA 11/30/2010    Past Surgical History:  Procedure Laterality Date  . A/V FISTULAGRAM Bilateral 07/30/2020   Procedure: CENTRAL VENOGRAM;  Surgeon: Angelia Mould, MD;  Location: [place] CV LAB;  Service: Cardiovascular;  Laterality: Bilateral;  bilateral upper venogram   . AV FISTULA PLACEMENT Right 10/13/2016   Procedure: ARTERIOVENOUS (AV) FISTULA CREATION;  Surgeon: [name], MD;  Location: [facility];  Service: Vascular;  Laterality: Right;  . AV FISTULA  PLACEMENT Left 02/25/2018   Procedure: INSERTION OF ARTERIOVENOUS (AV) GORE-TEX GRAFT LEFT UPPER ARM;  Surgeon: Angelia Mould, MD;  Location: [facility];  Service: Vascular;  Laterality: Left;  . AV FISTULA PLACEMENT Right 08/07/2018   Procedure: Creation of right arm brachiocephalic Fistula;  Surgeon: Waynetta Sandy, MD;  Location: [facility];  Service: Vascular;  Laterality: Right;  . AV FISTULA PLACEMENT Left 11/10/2019   Procedure: INSERTION OF ARTERIOVENOUS (AV) GORE-TEX GRAFT THIGH;  Surgeon: Elam Dutch, MD;  Location: [place];  Service: Vascular;  Laterality: Left;  . AV FISTULA PLACEMENT Right 12/02/2019   Procedure: INSERTION OF ARTERIOVENOUS (AV) GORE-TEX GRAFT RIGHT  THIGH;  Surgeon: Waynetta Sandy, MD;  Location: [facility];  Service: Vascular;  Laterality: Right;  . AV FISTULA PLACEMENT Right 08/13/2020   Procedure: INSERTION OF RIGHT ARTERIOVENOUS (AV) GORE-TEX GRAFT ARM;  Surgeon: Angelia Mould, MD;  Location: [place];  Service: Vascular;  Laterality: Right;  . BASCILIC VEIN TRANSPOSITION Left 06/24/2015   Procedure: BASILIC VEIN TRANSPOSITION;  Surgeon: Nelda Severe  Kellie Simmering, MD;  Location: [facility];  Service: Vascular;  Laterality: Left;  . BIOPSY  03/14/2019   Procedure: BIOPSY;  Surgeon: Irene Shipper, MD;  Location: [place];  Service: Endoscopy;;  . CATARACT EXTRACTION, BILATERAL Bilateral   . COLONOSCOPY WITH PROPOFOL N/A 03/14/2019   Procedure: COLONOSCOPY WITH PROPOFOL;  Surgeon: Irene Shipper, MD;  Location: [facility] ENDOSCOPY;  Service: Endoscopy;  Laterality: N/A;  . ESOPHAGOGASTRODUODENOSCOPY (EGD) WITH PROPOFOL N/A 05/05/2014   Procedure: ESOPHAGOGASTRODUODENOSCOPY (EGD) WITH PROPOFOL;  Surgeon: Missy Sabins, MD;  Location: [facility] ENDOSCOPY;  Service: Endoscopy;  Laterality: N/A;  . EYE SURGERY Bilateral    cataract surgery   . GLAUCOMA SURGERY Bilateral   . INSERTION OF ARTERIOVENOUS (AV) ARTEGRAFT ARM  10/09/2018   Procedure: INSERTION OF ARTERIOVENOUS (AV) 23mm  x 41cm ARTEGRAFT LEFT UPPER ARM;  Surgeon: Waynetta Sandy, MD;  Location: [facility];  Service: Vascular;;  . INSERTION OF DIALYSIS CATHETER Right 07/14/2019   Procedure: Insertion Of Dialysis Catheter;  Surgeon: Elam Dutch, MD;  Location: [facility];  Service: Vascular;  Laterality: Right;  placed right Internal Jugular  . INSERTION OF DIALYSIS CATHETER Left 08/13/2020   Procedure: INSERTION OF DIALYSIS CATHETER;  Surgeon: Angelia Mould, MD;  Location: [facility];  Service: Vascular;  Laterality: Left;  . INSERTION PROSTATE RADIATION SEED    . IR FLUORO GUIDE CV LINE RIGHT  02/18/2018  . IR REMOVAL TUN CV CATH W/O FL  12/06/2018  . IR THROMBECTOMY AV FISTULA W/THROMBOLYSIS INC/SHUNT/IMG RIGHT  11/12/2020  . IR THROMBECTOMY AV FISTULA W/THROMBOLYSIS/PTA INC/SHUNT/IMG RIGHT Right 01/26/2020  . IR THROMBECTOMY AV FISTULA W/THROMBOLYSIS/PTA INC/SHUNT/IMG RIGHT Right 02/24/2020  . IR THROMBECTOMY AV FISTULA W/THROMBOLYSIS/PTA INC/SHUNT/IMG RIGHT Right 04/07/2020  . IR US GUIDE VASC ACCESS RIGHT  02/18/2018  . IR US GUIDE VASC ACCESS RIGHT  01/26/2020  . IR US GUIDE VASC ACCESS RIGHT  04/07/2020  . IR US GUIDE VASC ACCESS RIGHT  11/12/2020  . LEFT HEART CATH AND CORONARY ANGIOGRAPHY N/A 02/20/2018   Procedure: LEFT HEART CATH AND CORONARY ANGIOGRAPHY;  Surgeon: Jettie Booze, MD;  Location: [facility] CV LAB;;; 50% ostial OM2, 25% mLAD.  Marland Kitchen LEFT HEART CATH AND CORONARY ANGIOGRAPHY  2004   mildly depressed LV systolic fx EF 83%,JASNKN coronaries/abdominal aorta/renal arteries.  . LOWER EXTREMITY ANGIOGRAM Left 11/17/2019   Procedure: Lower Extremity Fistulogram;  Surgeon: Marty Heck, MD;  Location: [place];  Service: Vascular;  Laterality: Left;  . NM [place]  02/21/2010   No ischemia or infarction.  EF 27%.  Marland Kitchen RADIOACTIVE SEED IMPLANT  2010   prostate cancer  . REMOVAL OF A DIALYSIS CATHETER Right 08/13/2020   Procedure: REMOVAL OF A DIALYSIS CATHETER;  Surgeon:  Angelia Mould, MD;  Location: [facility] OR;  Service: Vascular;  Laterality: Right;  . REVISION OF ARTERIOVENOUS GORETEX GRAFT Right 07/11/2019   Procedure: REVISION OF ARTERIOVENOUS GORETEX GRAFT RIGHT ARM WITH ARTEGRAFT;  Surgeon: Serafina Mitchell, MD;  Location: [place];  Service: Vascular;  Laterality: Right;  . SHUNTOGRAM Right 07/14/2019   Procedure: Shuntogram;  Surgeon: Elam Dutch, MD;  Location: [place];  Service: Vascular;  Laterality: Right;  . THROMBECTOMY AND REVISION OF ARTERIOVENTOUS (AV) GORETEX  GRAFT Right 07/14/2019   Procedure: THROMBECTOMY AND REVISION OF ARTERIOVENTOUS (AV) GORETEX  GRAFT;  Surgeon: Elam Dutch, MD;  Location: [place];  Service: Vascular;  Laterality: Right;  . THROMBECTOMY W/ EMBOLECTOMY Left 02/26/2018   Procedure: THROMBECTOMY  ARTERIOVENOUS GRAFT;  Surgeon: Angelia Mould, MD;  Location: [place];  Service: Vascular;  Laterality: Left;  . THROMBECTOMY W/ EMBOLECTOMY Left 11/17/2019   Procedure: THROMBECTOMY ARTERIOVENOUS GRAFT LEFT THIGH;  Surgeon: Marty Heck, MD;  Location: [facility];  Service: Vascular;  Laterality: Left;  . TRANSTHORACIC ECHOCARDIOGRAM  02/2012   [facility]) EF 45-50% with mild global HK.  Mild LVH,LA mod. dilated,mild-mod. 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 AI.  Marland Kitchen TRANSTHORACIC ECHOCARDIOGRAM  9/'18; 3/'19   a) Severely reduced EF.  25 from 30%.  GR 1 DD with diffuse ecchymosis. Mild AS & AI.  Mild LA/RA dilation; b) (post PEA arest):   Moderately dilated LV with moderate concentric virtually.  EF 15 to 20%.  GR 1 DD.  Paradoxical septal motion. Mild AI.  Mild LA dilation.  Poorly visualized RV.  Marland Kitchen TRANSTHORACIC ECHOCARDIOGRAM  02/2019   EF 20-25%.  Unable to assess diastolic function (A. Fib).  No LV apical thrombus.  Mild AI.  Mildly reduced RV function, however poorly visualized.  Marland Kitchen ULTRASOUND GUIDANCE FOR VASCULAR ACCESS  02/20/2018   Procedure: Ultrasound Guidance For Vascular Access;   Surgeon: Jettie Booze, MD;  Location: [place] CV LAB;  Service: Cardiovascular;;  . UPPER EXTREMITY VENOGRAPHY N/A 07/08/2018   Procedure: UPPER EXTREMITY VENOGRAPHY;  Surgeon: Waynetta Sandy, MD;  Location: [place] CV LAB;  Service: Cardiovascular;  Laterality: N/A;  Bilateral       Family History  Problem Relation Age of Onset  . Hypertension Mother   . Thyroid disease Mother   . Cholelithiasis Daughter   . Cholelithiasis Son   . Hypertension Maternal Grandmother   . Diabetes Maternal Grandmother   . Heart attack Neg Hx   . Hyperlipidemia Neg Hx     Social History   Tobacco Use  . Smoking status: Former Smoker    Packs/day: 1.00    Years: 30.00    Pack years: 30.00    Types: Cigarettes    Quit date: 2006    Years since quitting: 16.0  . Smokeless tobacco: Never Used  Vaping Use  . Vaping Use: Never used  Substance Use Topics  . Alcohol use: Yes    Alcohol/week: 1.0 standard drink    Types: 1 Shots of liquor per week    Comment: occasional  . Drug use: Never    Home Medications Prior to Admission medications   Medication Sig Start Date End Date Taking? Authorizing Provider  HYDROcodone-acetaminophen (NORCO/VICODIN) 5-325 MG tablet Take 1-2 tablets by mouth every 6 (six) hours as needed for moderate pain. 12/15/20  Yes Fredia Sorrow, MD  acetaminophen (TYLENOL) 500 MG tablet Take 1,000 mg by mouth every 6 (six) hours as needed for moderate pain.    [provider]  allopurinol (ZYLOPRIM) 100 MG tablet TAKE 1 TABLET BY MOUTH 4 TIMES WEEKLY( MONDAY, [place]) AFTER DIALYSIS Patient taking differently: Take 100 mg by mouth See admin instructions. TAKE 1 TABLET BY MOUTH 4 TIMES WEEKLY( MONDAY, [place]) AFTER DIALYSIS 10/20/20   Croitoru, Mihai, MD  amiodarone (PACERONE) 200 MG tablet TAKE 1 TABLET(200 MG) BY MOUTH DAILY Patient taking differently: Take 200 mg by mouth daily. 10/20/20   Croitoru,  Mihai, MD  apixaban (ELIQUIS) 5 MG TABS tablet Take 1 tablet (5 mg total) by mouth 2 (two) times daily. 06/24/20   Croitoru, Mihai, MD  [name] 50-200-25 MG TABS tablet TAKE 1 TABLET BY MOUTH DAILY 09/14/20  Tommy Medal, Lavell Islam, MD  brimonidine (ALPHAGAN P) 0.1 % SOLN Place 1 drop into the right eye 2 (two) times daily.    [provider]  dorzolamide-timolol (COSOPT) 22.3-6.8 MG/ML ophthalmic solution Place 1 drop into the right eye 2 (two) times daily.    [provider]  heparin 1000 unit/mL SOLN injection Heparin Sodium (Porcine) 1,000 Units/mL Catheter Lock Arterial 08/21/20 08/20/21  [provider]  lidocaine-prilocaine (EMLA) cream Apply 1 application topically See admin instructions. Use on dialysis days 09/06/20   [provider]  midodrine (PROAMATINE) 10 MG tablet Take 1 tablet (10 mg total) by mouth as directed. Twice a day on the day of HD Tuesday, Thursday, Saturday (1 in AM and 1 at [place]). Take 1/2 tablet (5mg ) a day on non HD days Patient taking differently: Take 10-20 mg by mouth See admin instructions. Take 10 mg in the morning at 5 AM. take 10 mg at 6AM of dialysis days and 10 mg half way through treatment on dialysis days (Mon, Tues, [place], and Sat) 04/22/19   Leonie Man, MD  montelukast (SINGULAIR) 10 MG tablet Take 1 tablet (10 mg total) by mouth at bedtime. 11/03/20 12/03/20  [name], NP  multivitamin (RENA-VIT) TABS tablet Take 1 tablet by mouth every evening. 05/26/20   [provider]  ofloxacin (OCUFLOX) 0.3 % ophthalmic solution Place 1 drop into the right eye 4 (four) times daily. 06/23/20   [provider]  oxyCODONE (ROXICODONE) 5 MG immediate release tablet Take 1 tablet (5 mg total) by mouth every 4 (four) hours as needed. Patient taking differently: Take 5 mg by mouth every 4 (four) hours as needed for moderate pain. 08/13/20   Angelia Mould, MD  pantoprazole (PROTONIX) 40 MG tablet Take 1  tablet (40 mg total) by mouth daily. 11/25/20   Shelda Pal, DO  polyethylene glycol (MIRALAX / GLYCOLAX) 17 g packet Take 17 g by mouth daily as needed for mild constipation or moderate constipation.     [provider]  sucroferric oxyhydroxide (VELPHORO) 500 MG chewable tablet Chew 500 mg by mouth 3 (three) times daily with meals.    [provider]    Allergies    Dextromethorphan-guaifenesin, Tocotrienols, and Losartan potassium  Review of Systems   Review of Systems  Constitutional: Negative for chills and fever.  HENT: Negative for rhinorrhea and sore throat.   Eyes: Negative for visual disturbance.  Respiratory: Negative for cough and shortness of breath.   Cardiovascular: Negative for chest pain and leg swelling.  Gastrointestinal: Negative for abdominal pain, diarrhea, nausea and vomiting.  Genitourinary: Negative for dysuria.  Musculoskeletal: Negative for back pain and neck pain.  Skin: Negative for rash.  Neurological: Negative for dizziness, light-headedness and headaches.  Hematological: Bruises/bleeds easily.  Psychiatric/Behavioral: Negative for confusion.    Physical Exam Updated Vital Signs BP 128/70 (BP Location: Left Arm)   Pulse 83   Temp 98.5 F (36.9 C) (Oral)   Resp 18   SpO2 99%   Physical Exam Vitals and nursing note reviewed.  Constitutional:      Appearance: Normal appearance. He is well-developed and well-nourished.  HENT:     Head: Normocephalic and atraumatic.  Eyes:     Extraocular Movements: Extraocular movements intact.     Conjunctiva/sclera: Conjunctivae normal.     Pupils: Pupils are equal, round, and reactive to light.  Cardiovascular:     Rate and Rhythm: Normal rate and regular rhythm.  Heart sounds: No murmur heard.   Pulmonary:     Effort: Pulmonary effort is normal. No respiratory distress.     Breath sounds: Normal breath sounds.  Abdominal:     Palpations: Abdomen is soft.      Tenderness: There is no abdominal tenderness.  Musculoskeletal:        General: Tenderness present. No edema. Normal range of motion.     Cervical back: Normal range of motion and neck supple.     Comments: Tenderness to palpation to the left hip area.  Some pain with range of motion.  Knee is fine ankle-foot without any discomfort or deformities.  No significant swelling.  Good cap refill good movement of the toes good movement at the knee ankle and foot.  Skin:    General: Skin is warm and dry.     Capillary Refill: Capillary refill takes less than 2 seconds.  Neurological:     General: No focal deficit present.     Mental Status: He is alert and oriented to person, place, and time.     Cranial Nerves: No cranial nerve deficit.     Sensory: No sensory deficit.     Motor: No weakness.  Psychiatric:        Mood and Affect: Mood and affect normal.     ED Results / Procedures / Treatments   Labs (all labs ordered are listed, but only abnormal results are displayed) Labs Reviewed - No data to display  EKG None  Radiology CT Hip Left Wo Contrast  Result Date: 12/15/2020 CLINICAL DATA:  Left hip injury status post fall. EXAM: CT OF THE LEFT HIP WITHOUT CONTRAST TECHNIQUE: Multidetector CT imaging of the left hip was performed according to the standard protocol. Multiplanar CT image reconstructions were also generated. COMPARISON:  None. FINDINGS: Bones/Joint/Cartilage Generalized osteopenia. No acute fracture or dislocation. No aggressive osseous lesion. Benign chondroid lesion measuring 8 mm in the left greater trochanter. Severe advanced osteoarthritis of the left hip severe superior joint space narrowing with a bone-on-bone appearance, subchondral cystic changes, subchondral sclerosis, and marginal osteophytosis. Osteoarthritis of the left SI joint with partial ankylosis. Degenerative changes of the pubic symphysis. Normal alignment. No joint effusion. Ligaments Ligaments are suboptimally  evaluated by CT. Muscles and Tendons Muscles are normal. No muscle atrophy. Peripheral vascular atherosclerotic disease. Prostatic radiation seeds are noted. Soft tissue No fluid collection or hematoma.  No soft tissue mass. IMPRESSION: 1. No acute osseous injury of the left hip. 2. 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 Electronically Signed   By: Kathreen Devoid   On: 12/15/2020 12:05   DG Hip Unilat W or Wo Pelvis 2-3 Views Left  Result Date: 12/15/2020 CLINICAL DATA:  Fall, left hip pain EXAM: DG HIP (WITH OR WITHOUT PELVIS) 2-3V LEFT COMPARISON:  12/29/2011 FINDINGS: Advanced degenerative changes in the left hip with complete joint space loss, subchondral cyst and sclerosis and osteophyte formation. No acute bony abnormality. Specifically, no fracture, subluxation, or dislocation. Radiation seeds in the region of the prostate. IMPRESSION: Advanced degenerative changes.  No acute bony abnormality. Electronically Signed   By: Rolm Baptise M.D.   On: 12/15/2020 03:54    Procedures Procedures (including critical care time)  Medications Ordered in ED Medications  HYDROcodone-acetaminophen (NORCO/VICODIN) 5-325 MG per tablet 1 tablet (1 tablet Oral Given 12/15/20 1204)    ED Course  I have reviewed the triage vital signs and the nursing notes.  Pertinent labs & imaging results that were available  during my care of the patient were reviewed by me and considered in my medical decision making (see chart for details).   Plain x-rays showed a lot of degenerative changes.  The patient seemed to have a lot of discomfort so CT of the hip was done just to rule out a hairline fracture.  That was negative for fracture.  Patient has significant degenerative changes in the left hip compared to the right.    MDM Rules/Calculators/A&P                            As above CT showed no evidence of any fracture.  Patient stable to ambulate on the leg.  We treated with pain medicine follow-up with  primary care doctor.  No evidence of any other injuries   Final Clinical Impression(s) / ED Diagnoses Final diagnoses:  Fall, initial encounter  Left hip pain    Rx / DC Orders ED Discharge Orders         Ordered    HYDROcodone-acetaminophen (NORCO/VICODIN) 5-325 MG tablet  Every 6 hours PRN        12/15/20 1517           Fredia Sorrow, MD 12/15/20 1521

## 2020-12-15 NOTE — ED Triage Notes (Signed)
Pt had a mechanical fall in the bathroom onto L hip, unable to bear weight, no shortening or rotation, pt is on Eliquis, did not hit head.

## 2020-12-15 NOTE — Discharge Instructions (Addendum)
CT scan of the left hip shows a lot of degenerative changes.  With no fracture or dislocation.  Okay to ambulate on it.  Take the hydrocodone as needed for pain.  Return for any new or worse symptoms.  Make an appointment to follow-up with your doctor

## 2020-12-16 DIAGNOSIS — N2581 Secondary hyperparathyroidism of renal origin: Secondary | ICD-10-CM | POA: Diagnosis not present

## 2020-12-16 DIAGNOSIS — N186 End stage renal disease: Secondary | ICD-10-CM | POA: Diagnosis not present

## 2020-12-16 DIAGNOSIS — Z992 Dependence on renal dialysis: Secondary | ICD-10-CM | POA: Diagnosis not present

## 2020-12-17 DIAGNOSIS — Z452 Encounter for adjustment and management of vascular access device: Secondary | ICD-10-CM | POA: Diagnosis not present

## 2020-12-17 DIAGNOSIS — N186 End stage renal disease: Secondary | ICD-10-CM | POA: Diagnosis not present

## 2020-12-17 DIAGNOSIS — Z992 Dependence on renal dialysis: Secondary | ICD-10-CM | POA: Diagnosis not present

## 2020-12-18 DIAGNOSIS — N2581 Secondary hyperparathyroidism of renal origin: Secondary | ICD-10-CM | POA: Diagnosis not present

## 2020-12-18 DIAGNOSIS — Z992 Dependence on renal dialysis: Secondary | ICD-10-CM | POA: Diagnosis not present

## 2020-12-18 DIAGNOSIS — N186 End stage renal disease: Secondary | ICD-10-CM | POA: Diagnosis not present

## 2020-12-21 DIAGNOSIS — Z992 Dependence on renal dialysis: Secondary | ICD-10-CM | POA: Diagnosis not present

## 2020-12-21 DIAGNOSIS — N2581 Secondary hyperparathyroidism of renal origin: Secondary | ICD-10-CM | POA: Diagnosis not present

## 2020-12-21 DIAGNOSIS — N186 End stage renal disease: Secondary | ICD-10-CM | POA: Diagnosis not present

## 2020-12-22 DIAGNOSIS — I083 Combined rheumatic disorders of mitral, aortic and tricuspid valves: Secondary | ICD-10-CM | POA: Diagnosis not present

## 2020-12-22 DIAGNOSIS — D509 Iron deficiency anemia, unspecified: Secondary | ICD-10-CM | POA: Diagnosis not present

## 2020-12-22 DIAGNOSIS — I132 Hypertensive heart and chronic kidney disease with heart failure and with stage 5 chronic kidney disease, or end stage renal disease: Secondary | ICD-10-CM | POA: Diagnosis not present

## 2020-12-22 DIAGNOSIS — I0981 Rheumatic heart failure: Secondary | ICD-10-CM | POA: Diagnosis not present

## 2020-12-22 DIAGNOSIS — I5043 Acute on chronic combined systolic (congestive) and diastolic (congestive) heart failure: Secondary | ICD-10-CM | POA: Diagnosis not present

## 2020-12-22 DIAGNOSIS — J440 Chronic obstructive pulmonary disease with acute lower respiratory infection: Secondary | ICD-10-CM | POA: Diagnosis not present

## 2020-12-22 DIAGNOSIS — D631 Anemia in chronic kidney disease: Secondary | ICD-10-CM | POA: Diagnosis not present

## 2020-12-22 DIAGNOSIS — M1030 Gout due to renal impairment, unspecified site: Secondary | ICD-10-CM | POA: Diagnosis not present

## 2020-12-22 DIAGNOSIS — N186 End stage renal disease: Secondary | ICD-10-CM | POA: Diagnosis not present

## 2020-12-23 DIAGNOSIS — Z992 Dependence on renal dialysis: Secondary | ICD-10-CM | POA: Diagnosis not present

## 2020-12-23 DIAGNOSIS — N2581 Secondary hyperparathyroidism of renal origin: Secondary | ICD-10-CM | POA: Diagnosis not present

## 2020-12-23 DIAGNOSIS — N186 End stage renal disease: Secondary | ICD-10-CM | POA: Diagnosis not present

## 2020-12-24 DIAGNOSIS — I0981 Rheumatic heart failure: Secondary | ICD-10-CM | POA: Diagnosis not present

## 2020-12-24 DIAGNOSIS — J440 Chronic obstructive pulmonary disease with acute lower respiratory infection: Secondary | ICD-10-CM | POA: Diagnosis not present

## 2020-12-24 DIAGNOSIS — I5043 Acute on chronic combined systolic (congestive) and diastolic (congestive) heart failure: Secondary | ICD-10-CM | POA: Diagnosis not present

## 2020-12-24 DIAGNOSIS — N186 End stage renal disease: Secondary | ICD-10-CM | POA: Diagnosis not present

## 2020-12-24 DIAGNOSIS — I083 Combined rheumatic disorders of mitral, aortic and tricuspid valves: Secondary | ICD-10-CM | POA: Diagnosis not present

## 2020-12-24 DIAGNOSIS — D631 Anemia in chronic kidney disease: Secondary | ICD-10-CM | POA: Diagnosis not present

## 2020-12-24 DIAGNOSIS — M1030 Gout due to renal impairment, unspecified site: Secondary | ICD-10-CM | POA: Diagnosis not present

## 2020-12-24 DIAGNOSIS — I132 Hypertensive heart and chronic kidney disease with heart failure and with stage 5 chronic kidney disease, or end stage renal disease: Secondary | ICD-10-CM | POA: Diagnosis not present

## 2020-12-24 DIAGNOSIS — D509 Iron deficiency anemia, unspecified: Secondary | ICD-10-CM | POA: Diagnosis not present

## 2020-12-25 DIAGNOSIS — N2581 Secondary hyperparathyroidism of renal origin: Secondary | ICD-10-CM | POA: Diagnosis not present

## 2020-12-25 DIAGNOSIS — N186 End stage renal disease: Secondary | ICD-10-CM | POA: Diagnosis not present

## 2020-12-25 DIAGNOSIS — Z992 Dependence on renal dialysis: Secondary | ICD-10-CM | POA: Diagnosis not present

## 2020-12-27 DIAGNOSIS — Z992 Dependence on renal dialysis: Secondary | ICD-10-CM | POA: Diagnosis not present

## 2020-12-27 DIAGNOSIS — N186 End stage renal disease: Secondary | ICD-10-CM | POA: Diagnosis not present

## 2020-12-27 DIAGNOSIS — N2581 Secondary hyperparathyroidism of renal origin: Secondary | ICD-10-CM | POA: Diagnosis not present

## 2020-12-28 DIAGNOSIS — N2581 Secondary hyperparathyroidism of renal origin: Secondary | ICD-10-CM | POA: Diagnosis not present

## 2020-12-28 DIAGNOSIS — N186 End stage renal disease: Secondary | ICD-10-CM | POA: Diagnosis not present

## 2020-12-28 DIAGNOSIS — Z992 Dependence on renal dialysis: Secondary | ICD-10-CM | POA: Diagnosis not present

## 2020-12-29 DIAGNOSIS — J440 Chronic obstructive pulmonary disease with acute lower respiratory infection: Secondary | ICD-10-CM | POA: Diagnosis not present

## 2020-12-29 DIAGNOSIS — D631 Anemia in chronic kidney disease: Secondary | ICD-10-CM | POA: Diagnosis not present

## 2020-12-29 DIAGNOSIS — M1030 Gout due to renal impairment, unspecified site: Secondary | ICD-10-CM | POA: Diagnosis not present

## 2020-12-29 DIAGNOSIS — I083 Combined rheumatic disorders of mitral, aortic and tricuspid valves: Secondary | ICD-10-CM | POA: Diagnosis not present

## 2020-12-29 DIAGNOSIS — I132 Hypertensive heart and chronic kidney disease with heart failure and with stage 5 chronic kidney disease, or end stage renal disease: Secondary | ICD-10-CM | POA: Diagnosis not present

## 2020-12-29 DIAGNOSIS — I0981 Rheumatic heart failure: Secondary | ICD-10-CM | POA: Diagnosis not present

## 2020-12-29 DIAGNOSIS — I5043 Acute on chronic combined systolic (congestive) and diastolic (congestive) heart failure: Secondary | ICD-10-CM | POA: Diagnosis not present

## 2020-12-29 DIAGNOSIS — D509 Iron deficiency anemia, unspecified: Secondary | ICD-10-CM | POA: Diagnosis not present

## 2020-12-29 DIAGNOSIS — N186 End stage renal disease: Secondary | ICD-10-CM | POA: Diagnosis not present

## 2020-12-30 DIAGNOSIS — Z992 Dependence on renal dialysis: Secondary | ICD-10-CM | POA: Diagnosis not present

## 2020-12-30 DIAGNOSIS — N2581 Secondary hyperparathyroidism of renal origin: Secondary | ICD-10-CM | POA: Diagnosis not present

## 2020-12-30 DIAGNOSIS — N186 End stage renal disease: Secondary | ICD-10-CM | POA: Diagnosis not present

## 2020-12-31 DIAGNOSIS — N186 End stage renal disease: Secondary | ICD-10-CM | POA: Diagnosis not present

## 2020-12-31 DIAGNOSIS — M1030 Gout due to renal impairment, unspecified site: Secondary | ICD-10-CM | POA: Diagnosis not present

## 2020-12-31 DIAGNOSIS — D509 Iron deficiency anemia, unspecified: Secondary | ICD-10-CM | POA: Diagnosis not present

## 2020-12-31 DIAGNOSIS — I132 Hypertensive heart and chronic kidney disease with heart failure and with stage 5 chronic kidney disease, or end stage renal disease: Secondary | ICD-10-CM | POA: Diagnosis not present

## 2020-12-31 DIAGNOSIS — I5043 Acute on chronic combined systolic (congestive) and diastolic (congestive) heart failure: Secondary | ICD-10-CM | POA: Diagnosis not present

## 2020-12-31 DIAGNOSIS — I083 Combined rheumatic disorders of mitral, aortic and tricuspid valves: Secondary | ICD-10-CM | POA: Diagnosis not present

## 2020-12-31 DIAGNOSIS — D631 Anemia in chronic kidney disease: Secondary | ICD-10-CM | POA: Diagnosis not present

## 2020-12-31 DIAGNOSIS — J440 Chronic obstructive pulmonary disease with acute lower respiratory infection: Secondary | ICD-10-CM | POA: Diagnosis not present

## 2020-12-31 DIAGNOSIS — I0981 Rheumatic heart failure: Secondary | ICD-10-CM | POA: Diagnosis not present

## 2021-01-01 DIAGNOSIS — N2581 Secondary hyperparathyroidism of renal origin: Secondary | ICD-10-CM | POA: Diagnosis not present

## 2021-01-01 DIAGNOSIS — N186 End stage renal disease: Secondary | ICD-10-CM | POA: Diagnosis not present

## 2021-01-01 DIAGNOSIS — Z992 Dependence on renal dialysis: Secondary | ICD-10-CM | POA: Diagnosis not present

## 2021-01-03 DIAGNOSIS — B20 Human immunodeficiency virus [HIV] disease: Secondary | ICD-10-CM | POA: Diagnosis not present

## 2021-01-03 DIAGNOSIS — Z992 Dependence on renal dialysis: Secondary | ICD-10-CM | POA: Diagnosis not present

## 2021-01-03 DIAGNOSIS — N186 End stage renal disease: Secondary | ICD-10-CM | POA: Diagnosis not present

## 2021-01-03 DIAGNOSIS — N2581 Secondary hyperparathyroidism of renal origin: Secondary | ICD-10-CM | POA: Diagnosis not present

## 2021-01-04 DIAGNOSIS — Z992 Dependence on renal dialysis: Secondary | ICD-10-CM | POA: Diagnosis not present

## 2021-01-04 DIAGNOSIS — N2581 Secondary hyperparathyroidism of renal origin: Secondary | ICD-10-CM | POA: Diagnosis not present

## 2021-01-04 DIAGNOSIS — N186 End stage renal disease: Secondary | ICD-10-CM | POA: Diagnosis not present

## 2021-01-05 DIAGNOSIS — N186 End stage renal disease: Secondary | ICD-10-CM | POA: Diagnosis not present

## 2021-01-05 DIAGNOSIS — I0981 Rheumatic heart failure: Secondary | ICD-10-CM | POA: Diagnosis not present

## 2021-01-05 DIAGNOSIS — D631 Anemia in chronic kidney disease: Secondary | ICD-10-CM | POA: Diagnosis not present

## 2021-01-05 DIAGNOSIS — M1030 Gout due to renal impairment, unspecified site: Secondary | ICD-10-CM | POA: Diagnosis not present

## 2021-01-05 DIAGNOSIS — D509 Iron deficiency anemia, unspecified: Secondary | ICD-10-CM | POA: Diagnosis not present

## 2021-01-05 DIAGNOSIS — J440 Chronic obstructive pulmonary disease with acute lower respiratory infection: Secondary | ICD-10-CM | POA: Diagnosis not present

## 2021-01-05 DIAGNOSIS — I5043 Acute on chronic combined systolic (congestive) and diastolic (congestive) heart failure: Secondary | ICD-10-CM | POA: Diagnosis not present

## 2021-01-05 DIAGNOSIS — I132 Hypertensive heart and chronic kidney disease with heart failure and with stage 5 chronic kidney disease, or end stage renal disease: Secondary | ICD-10-CM | POA: Diagnosis not present

## 2021-01-05 DIAGNOSIS — I083 Combined rheumatic disorders of mitral, aortic and tricuspid valves: Secondary | ICD-10-CM | POA: Diagnosis not present

## 2021-01-06 DIAGNOSIS — Z992 Dependence on renal dialysis: Secondary | ICD-10-CM | POA: Diagnosis not present

## 2021-01-06 DIAGNOSIS — N186 End stage renal disease: Secondary | ICD-10-CM | POA: Diagnosis not present

## 2021-01-06 DIAGNOSIS — N2581 Secondary hyperparathyroidism of renal origin: Secondary | ICD-10-CM | POA: Diagnosis not present

## 2021-01-07 DIAGNOSIS — I132 Hypertensive heart and chronic kidney disease with heart failure and with stage 5 chronic kidney disease, or end stage renal disease: Secondary | ICD-10-CM | POA: Diagnosis not present

## 2021-01-07 DIAGNOSIS — M1030 Gout due to renal impairment, unspecified site: Secondary | ICD-10-CM | POA: Diagnosis not present

## 2021-01-07 DIAGNOSIS — J440 Chronic obstructive pulmonary disease with acute lower respiratory infection: Secondary | ICD-10-CM | POA: Diagnosis not present

## 2021-01-07 DIAGNOSIS — N186 End stage renal disease: Secondary | ICD-10-CM | POA: Diagnosis not present

## 2021-01-07 DIAGNOSIS — I083 Combined rheumatic disorders of mitral, aortic and tricuspid valves: Secondary | ICD-10-CM | POA: Diagnosis not present

## 2021-01-07 DIAGNOSIS — D509 Iron deficiency anemia, unspecified: Secondary | ICD-10-CM | POA: Diagnosis not present

## 2021-01-07 DIAGNOSIS — I5043 Acute on chronic combined systolic (congestive) and diastolic (congestive) heart failure: Secondary | ICD-10-CM | POA: Diagnosis not present

## 2021-01-07 DIAGNOSIS — I0981 Rheumatic heart failure: Secondary | ICD-10-CM | POA: Diagnosis not present

## 2021-01-07 DIAGNOSIS — D631 Anemia in chronic kidney disease: Secondary | ICD-10-CM | POA: Diagnosis not present

## 2021-01-08 DIAGNOSIS — N2581 Secondary hyperparathyroidism of renal origin: Secondary | ICD-10-CM | POA: Diagnosis not present

## 2021-01-08 DIAGNOSIS — Z992 Dependence on renal dialysis: Secondary | ICD-10-CM | POA: Diagnosis not present

## 2021-01-08 DIAGNOSIS — N186 End stage renal disease: Secondary | ICD-10-CM | POA: Diagnosis not present

## 2021-01-10 DIAGNOSIS — N2581 Secondary hyperparathyroidism of renal origin: Secondary | ICD-10-CM | POA: Diagnosis not present

## 2021-01-10 DIAGNOSIS — Z992 Dependence on renal dialysis: Secondary | ICD-10-CM | POA: Diagnosis not present

## 2021-01-10 DIAGNOSIS — N186 End stage renal disease: Secondary | ICD-10-CM | POA: Diagnosis not present

## 2021-01-11 ENCOUNTER — Other Ambulatory Visit: Payer: Self-pay

## 2021-01-11 ENCOUNTER — Ambulatory Visit: Payer: Medicare HMO | Admitting: Cardiovascular Disease

## 2021-01-11 ENCOUNTER — Encounter: Payer: Self-pay | Admitting: Cardiovascular Disease

## 2021-01-11 VITALS — BP 112/64 | HR 76 | Ht 74.0 in | Wt 189.0 lb

## 2021-01-11 DIAGNOSIS — I447 Left bundle-branch block, unspecified: Secondary | ICD-10-CM

## 2021-01-11 DIAGNOSIS — B20 Human immunodeficiency virus [HIV] disease: Secondary | ICD-10-CM

## 2021-01-11 DIAGNOSIS — Z7901 Long term (current) use of anticoagulants: Secondary | ICD-10-CM

## 2021-01-11 DIAGNOSIS — I480 Paroxysmal atrial fibrillation: Secondary | ICD-10-CM | POA: Diagnosis not present

## 2021-01-11 DIAGNOSIS — Z992 Dependence on renal dialysis: Secondary | ICD-10-CM | POA: Diagnosis not present

## 2021-01-11 DIAGNOSIS — Z79899 Other long term (current) drug therapy: Secondary | ICD-10-CM

## 2021-01-11 DIAGNOSIS — I420 Dilated cardiomyopathy: Secondary | ICD-10-CM | POA: Diagnosis not present

## 2021-01-11 DIAGNOSIS — N186 End stage renal disease: Secondary | ICD-10-CM

## 2021-01-11 DIAGNOSIS — Z5181 Encounter for therapeutic drug level monitoring: Secondary | ICD-10-CM | POA: Diagnosis not present

## 2021-01-11 DIAGNOSIS — E7800 Pure hypercholesterolemia, unspecified: Secondary | ICD-10-CM | POA: Diagnosis not present

## 2021-01-11 DIAGNOSIS — I5042 Chronic combined systolic (congestive) and diastolic (congestive) heart failure: Secondary | ICD-10-CM | POA: Diagnosis not present

## 2021-01-11 DIAGNOSIS — I2510 Atherosclerotic heart disease of native coronary artery without angina pectoris: Secondary | ICD-10-CM | POA: Diagnosis not present

## 2021-01-11 DIAGNOSIS — N2581 Secondary hyperparathyroidism of renal origin: Secondary | ICD-10-CM | POA: Diagnosis not present

## 2021-01-11 NOTE — Patient Instructions (Signed)
Medication Instructions:  No changes *If you need a refill on your cardiac medications before your next appointment, please call your pharmacy*   Lab Work: Your provider would like for you to have the following labs today: TSH  If you have labs (blood work) drawn today and your tests are completely normal, you will receive your results only by:  [place] (if you have MyChart) OR  A paper copy in the mail If you have any lab test that is abnormal or we need to change your treatment, we will call you to review the results.   Testing/Procedures: None ordered   Follow-Up: At [facility], you and your health needs are our priority.  As part of our continuing mission to provide you with exceptional heart care, we have created designated Provider Care Teams.  These Care Teams include your primary Cardiologist (physician) and Advanced Practice Providers (APPs -  Physician Assistants and Nurse Practitioners) who all work together to provide you with the care you need, when you need it.  We recommend signing up for the patient portal called "MyChart".  Sign up information is provided on this After Visit Summary.  MyChart is used to connect with patients for Virtual Visits (Telemedicine).  Patients are able to view lab/test results, encounter notes, upcoming appointments, etc.  Non-urgent messages can be sent to your provider as well.   To learn more about what you can do with MyChart, go to [email or web address].    Your next appointment:   6 month(s)  The format for your next appointment:   In Person  Provider:   You may see [name], MD or one of the following Advanced Practice Providers on your designated Care Team:    Almyra Deforest, PA-C  Fabian Sharp, PA-C or   Roby Lofts, [place]

## 2021-01-11 NOTE — Progress Notes (Signed)
Patient ID: David [name], male   DOB: 1941-10-15, 80 y.o.   MRN: [id number]    Cardiology Office Note    Date:  01/16/2021   ID:  [id number] [place] [birth year], MRN [id number]  PCP:  [name], NP  Cardiologist:   [name], MD   No chief complaint on file.   History of Present Illness:  David [name] is a 80 y.o. 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  Has had fewer problems with HD-related hypotension. Takes midodrine 20 mg before each session of dialysis and occasionally takes an extra 10 mg in the middle of hemodialysis if his blood pressure drops.  He does not take any midodrine on nondialysis days (he dialyzes 4 days a week).  For the last 4 weeks or so he no longer receives dialysis through a catheter, since his right upper extremity arteriovenous fistula has matured and is working well.  He denies problems with chest pain at rest or with activity, lower extremity edema.  He still makes about enough urine to urinate once a day.  He remains extremely sedentary.    He believes his dry weight is currently 85 kg (188 pounds).  He was seen in the emergency room after a fall in January 2022, but this was not due to syncope and he did not have head injury.  He did not require hospitalization.  He was hospitalized with a febrile illness which was not Covid just after Christmas 2021.  He was felt to have heart failure exacerbation.  Echo showed ejection fraction of 25%, not meaningfully changed from March 2020.   Mitral valve regurgitation was described as moderate, compared with trivial on the previous echo.  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  Aortic regurgitation was described as mild to moderate and is associated with a moderate  dilation of the ascending aorta at 47 mm.  No vegetations were seen.  Blood cultures were negative during that admission.  He was hospitalized in June 2021 with abdominal pain and abnormal liver function tests due to choledocholithiasis, resolved spontaneously without ERCP.  MRCP was negative for a filling defect but showed "sludge" in his biliary system.  He had rate controlled atrial fibrillation during that visit, converted to sinus rhythm by the time of discharge.  He has well compensated HIV infection with a CD4 count of over 400 and undetectable viral load.  David [name] had a major setback in January 2021 when he was diagnosed with COVID-19.  Hospitalized for almost a month from January 7 until February 5.  He received remdesivir and dexamethasone.  Palliative care was recommended and he was switched to DNR status.  Subsequently discharged to skilled nursing at [facility].  Since then he has had a visit with Dr. Tommy Medal.  [name] decided to rescind his DNR status and is again full code.  He was rehospitalized on January 24, 2020 with acute pulmonary edema and atrial fibrillation with rapid ventricular response.  He had lost a lot of weight during his COVID-19 infection and his dry weight was reassessed to a lower value (78 kg)..  He was treated with intravenous amiodarone and subsequent oral amiodarone.  After hemodialysis on the day of admission he converted to sinus rhythm, suggesting that the episode of atrial fibrillation was triggered by  heart failure decompensation and not vice versa.  He is on chronic warfarin anticoagulation.  He continues to undergo hemodialysis 4 days a week (Monday Tuesday Thursday and Saturday) with abbreviated 3.5-hour sessions.  This allows removal of a lower volume each time and has allowed him to complete dialysis sessions and not be hospitalized with heart failure.  He takes a high dose of midodrine (10 mg in the morning and 10 mg at noon) on dialysis days and takes  only 5 mg once daily on other days of the week.  David [name] has a long-standing history of nonischemic cardiomyopathy (normal coronary angio 2004) suffered a PEA arrest on February 18, 276, complicated by development of new atrial fibrillation and progression to end-stage renal disease now on hemodialysis.  Most recently his ejection fraction has been estimated to be 15-20%.  Coronary angiography after his cardiac arrest showed only minor atherosclerosis without significant stenotic lesions.  He has an atypical left bundle branch block.   He is HIV positive, with undetectable viral load on antiretroviral therapy, monitored by Dr. Tommy Medal.     He has had multiple failed AV fistulas and is now receiving dialysis via a right upper arm AV fistula implanted August 2021 by Dr. Scot Dock.   Past Medical History:  Diagnosis Date  . 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 Acute on chronic systolic and diastolic heart failure, NYHA class 4 ([place])   . Anemia, iron deficiency 11/15/2011  . Arthritis    "hands, right knee, feet" (02/21/2018)  . Atrial fibrillation ([place])   . Cardiac arrest ([place]) 02/17/2018  . CHF (congestive heart failure) ([place])   . Chronic combined systolic and diastolic heart failure, NYHA class 3 ([place]) 06/2010   felt to be secondary to hypertensive cardiomyopathy  . Chronic lower back pain   . CKD (chronic kidney disease) stage V requiring chronic dialysis ([place])   . COVID-19 12/2019  . ESRD on dialysis [facility])    started 02/2018 [facility]  . GERD (gastroesophageal reflux disease)   . Glaucoma    "I had surgery and dont have it any more"  . Gout    daily RX (02/21/2018)  . Heart murmur    "mild" per pt  . Hepatitis    years ago  . HIV (human immunodeficiency virus infection) ([place])   . HIV infection ([place])   . Hyperlipidemia   . Hypertension   . Nonischemic cardiomyopathy ([place])   . Pneumonia 11/2017  . Prostate cancer ([place])    hx of prostate; s/p radioactive  seed implant 10/2009 Dr Janice Norrie  . Sinus bradycardia   . Sleep apnea    does not use a cpap  . Stroke [facility])    "mini stroke" years ago  . Wears glasses     Past Surgical History:  Procedure Laterality Date  . A/V FISTULAGRAM Bilateral 07/30/2020   Procedure: CENTRAL VENOGRAM;  Surgeon: Angelia Mould, MD;  Location: [place] [facility];  Service: Cardiovascular;  Laterality: Bilateral;  bilateral upper venogram   . AV FISTULA PLACEMENT Right 10/13/2016   Procedure: ARTERIOVENOUS (AV) FISTULA CREATION;  Surgeon: [name], MD;  Location: [place];  Service: Vascular;  Laterality: Right;  . AV FISTULA PLACEMENT Left 02/25/2018   Procedure: INSERTION OF ARTERIOVENOUS (AV) GORE-TEX GRAFT LEFT UPPER ARM;  Surgeon: Angelia Mould, MD;  Location: [place];  Service: Vascular;  Laterality: Left;  . AV FISTULA PLACEMENT Right 08/07/2018   Procedure: Creation of right arm brachiocephalic  Fistula;  Surgeon: Waynetta Sandy, MD;  Location: [place];  Service: Vascular;  Laterality: Right;  . AV FISTULA PLACEMENT Left 11/10/2019   Procedure: INSERTION OF ARTERIOVENOUS (AV) GORE-TEX GRAFT THIGH;  Surgeon: Elam Dutch, MD;  Location: [place];  Service: Vascular;  Laterality: Left;  . AV FISTULA PLACEMENT Right 12/02/2019   Procedure: INSERTION OF ARTERIOVENOUS (AV) GORE-TEX GRAFT RIGHT  THIGH;  Surgeon: Waynetta Sandy, MD;  Location: [place];  Service: Vascular;  Laterality: Right;  . AV FISTULA PLACEMENT Right 08/13/2020   Procedure: INSERTION OF RIGHT ARTERIOVENOUS (AV) GORE-TEX GRAFT ARM;  Surgeon: Angelia Mould, MD;  Location: [place];  Service: Vascular;  Laterality: Right;  . BASCILIC VEIN TRANSPOSITION Left 06/24/2015   Procedure: BASILIC VEIN TRANSPOSITION;  Surgeon: Mal Misty, MD;  Location: [place];  Service: Vascular;  Laterality: Left;  . BIOPSY  03/14/2019   Procedure: BIOPSY;  Surgeon: Irene Shipper, MD;  Location: [place];  Service: Endoscopy;;  .  CATARACT EXTRACTION, BILATERAL Bilateral   . COLONOSCOPY WITH PROPOFOL N/A 03/14/2019   Procedure: COLONOSCOPY WITH PROPOFOL;  Surgeon: Irene Shipper, MD;  [facility] ENDOSCOPY;  Service: Endoscopy;  Laterality: N/A;  . ESOPHAGOGASTRODUODENOSCOPY (EGD) WITH PROPOFOL N/A 05/05/2014   Procedure: ESOPHAGOGASTRODUODENOSCOPY (EGD) WITH PROPOFOL;  Surgeon: Missy Sabins, MD;  Location: [facility] ENDOSCOPY;  Service: Endoscopy;  Laterality: N/A;  . EYE SURGERY Bilateral    cataract surgery   . GLAUCOMA SURGERY Bilateral   . INSERTION OF ARTERIOVENOUS (AV) ARTEGRAFT ARM  10/09/2018   Procedure: INSERTION OF ARTERIOVENOUS (AV) 73mm x 41cm ARTEGRAFT LEFT UPPER ARM;  Surgeon: Waynetta Sandy, MD;  Location: [place];  Service: Vascular;;  . INSERTION OF DIALYSIS CATHETER Right 07/14/2019   Procedure: Insertion Of Dialysis Catheter;  Surgeon: Elam Dutch, MD;  Location: [facility] OR;  Service: Vascular;  Laterality: Right;  placed right Internal Jugular  . INSERTION OF DIALYSIS CATHETER Left 08/13/2020   Procedure: INSERTION OF DIALYSIS CATHETER;  Surgeon: Angelia Mould, MD;  Location: [place];  Service: Vascular;  Laterality: Left;  . INSERTION PROSTATE RADIATION SEED    . IR FLUORO GUIDE CV LINE RIGHT  02/18/2018  . IR REMOVAL TUN CV CATH W/O FL  12/06/2018  . IR THROMBECTOMY AV FISTULA W/THROMBOLYSIS INC/SHUNT/IMG RIGHT  11/12/2020  . IR THROMBECTOMY AV FISTULA W/THROMBOLYSIS/PTA INC/SHUNT/IMG RIGHT Right 01/26/2020  . IR THROMBECTOMY AV FISTULA W/THROMBOLYSIS/PTA INC/SHUNT/IMG RIGHT Right 02/24/2020  . IR THROMBECTOMY AV FISTULA W/THROMBOLYSIS/PTA INC/SHUNT/IMG RIGHT Right 04/07/2020  . IR US GUIDE VASC ACCESS RIGHT  02/18/2018  . IR US GUIDE VASC ACCESS RIGHT  01/26/2020  . IR US GUIDE VASC ACCESS RIGHT  04/07/2020  . IR US GUIDE VASC ACCESS RIGHT  11/12/2020  . LEFT HEART CATH AND CORONARY ANGIOGRAPHY N/A 02/20/2018   Procedure: LEFT HEART CATH AND CORONARY ANGIOGRAPHY;  Surgeon: Jettie Booze, MD;   Location: [place] [facility];;; 50% ostial OM2, 25% mLAD.  Marland Kitchen LEFT HEART CATH AND CORONARY ANGIOGRAPHY  2004   mildly depressed LV systolic fx EF 09%,QZRAQT coronaries/abdominal aorta/renal arteries.  . LOWER EXTREMITY ANGIOGRAM Left 11/17/2019   Procedure: Lower Extremity Fistulogram;  Surgeon: Marty Heck, MD;  Location: [place];  Service: Vascular;  Laterality: Left;  . NM [place]  02/21/2010   No ischemia or infarction.  EF 27%.  Marland Kitchen RADIOACTIVE SEED IMPLANT  2010   prostate cancer  . REMOVAL OF A DIALYSIS CATHETER Right 08/13/2020  Procedure: REMOVAL OF A DIALYSIS CATHETER;  Surgeon: Angelia Mould, MD;  Location: [name] OR;  Service: Vascular;  Laterality: Right;  . REVISION OF ARTERIOVENOUS GORETEX GRAFT Right 07/11/2019   Procedure: REVISION OF ARTERIOVENOUS GORETEX GRAFT RIGHT ARM WITH ARTEGRAFT;  Surgeon: Serafina Mitchell, MD;  Location: [place];  Service: Vascular;  Laterality: Right;  . SHUNTOGRAM Right 07/14/2019   Procedure: Shuntogram;  Surgeon: Elam Dutch, MD;  Location: [place];  Service: Vascular;  Laterality: Right;  . THROMBECTOMY AND REVISION OF ARTERIOVENTOUS (AV) GORETEX  GRAFT Right 07/14/2019   Procedure: THROMBECTOMY AND REVISION OF ARTERIOVENTOUS (AV) GORETEX  GRAFT;  Surgeon: Elam Dutch, MD;  Location: [place];  Service: Vascular;  Laterality: Right;  . THROMBECTOMY W/ EMBOLECTOMY Left 02/26/2018   Procedure: THROMBECTOMY ARTERIOVENOUS GRAFT;  Surgeon: Angelia Mould, MD;  Location: [place];  Service: Vascular;  Laterality: Left;  . THROMBECTOMY W/ EMBOLECTOMY Left 11/17/2019   Procedure: THROMBECTOMY ARTERIOVENOUS GRAFT LEFT THIGH;  Surgeon: Marty Heck, MD;  Location: [place];  Service: Vascular;  Laterality: Left;  . TRANSTHORACIC ECHOCARDIOGRAM  02/2012   [facility]) EF 45-50% with mild global HK.  Mild LVH,LA mod. dilated,mild-mod. 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 AI.  Marland Kitchen TRANSTHORACIC  ECHOCARDIOGRAM  9/'18; 3/'19   a) Severely reduced EF.  25 from 30%.  GR 1 DD with diffuse ecchymosis. Mild AS & AI.  Mild LA/RA dilation; b) (post PEA arest):   Moderately dilated LV with moderate concentric virtually.  EF 15 to 20%.  GR 1 DD.  Paradoxical septal motion. Mild AI.  Mild LA dilation.  Poorly visualized RV.  Marland Kitchen TRANSTHORACIC ECHOCARDIOGRAM  02/2019   EF 20-25%.  Unable to assess diastolic function (A. Fib).  No LV apical thrombus.  Mild AI.  Mildly reduced RV function, however poorly visualized.  Marland Kitchen ULTRASOUND GUIDANCE FOR VASCULAR ACCESS  02/20/2018   Procedure: Ultrasound Guidance For Vascular Access;  Surgeon: Jettie Booze, MD;  Location: [place] [facility];  Service: Cardiovascular;;  . UPPER EXTREMITY VENOGRAPHY N/A 07/08/2018   Procedure: UPPER EXTREMITY VENOGRAPHY;  Surgeon: Waynetta Sandy, MD;  Location: [place] [facility];  Service: Cardiovascular;  Laterality: N/A;  Bilateral    Current Outpatient Medications  Medication Sig Dispense Refill  . acetaminophen (TYLENOL) 500 MG tablet Take 1,000 mg by mouth every 6 (six) hours as needed for moderate pain.    Marland Kitchen allopurinol (ZYLOPRIM) 100 MG tablet TAKE 1 TABLET BY MOUTH 4 TIMES WEEKLY( MONDAY, TUESDAY,THURDSAY AND SATURDAY) AFTER DIALYSIS (Patient taking differently: Take 100 mg by mouth See admin instructions. TAKE 1 TABLET BY MOUTH 4 TIMES WEEKLY( MONDAY, TUESDAY,THURDSAY AND SATURDAY) AFTER DIALYSIS) 45 tablet 5  . amiodarone (PACERONE) 200 MG tablet TAKE 1 TABLET(200 MG) BY MOUTH DAILY (Patient taking differently: Take 200 mg by mouth daily.) 90 tablet 3  . apixaban (ELIQUIS) 5 MG TABS tablet Take 1 tablet (5 mg total) by mouth 2 (two) times daily. 60 tablet 5  . [name] 50-200-25 MG TABS tablet TAKE 1 TABLET BY MOUTH DAILY 30 tablet 5  . brimonidine (ALPHAGAN P) 0.1 % SOLN Place 1 drop into the right eye 2 (two) times daily.    . dorzolamide-timolol (COSOPT) 22.3-6.8 MG/ML ophthalmic solution Place 1 drop  into the right eye 2 (two) times daily.    . heparin 1000 unit/mL SOLN injection Heparin Sodium (Porcine) 1,000 Units/mL Catheter Lock Arterial    . HYDROcodone-acetaminophen (NORCO/VICODIN) 5-325 MG tablet Take  1-2 tablets by mouth every 6 (six) hours as needed for moderate pain. 14 tablet 0  . lidocaine-prilocaine (EMLA) cream Apply 1 application topically See admin instructions. Use on dialysis days    . midodrine (PROAMATINE) 10 MG tablet Take 1 tablet (10 mg total) by mouth as directed. Twice a day on the day of HD Tuesday, Thursday, Saturday (1 in AM and 1 at [place]). Take 1/2 tablet (5mg ) a day on non HD days (Patient taking differently: Take 10-20 mg by mouth See admin instructions. Take 10 mg in the morning at 5 AM. take 10 mg at 6AM of dialysis days and 10 mg half way through treatment on dialysis days (Mon, Tues, Thurs, and Sat)) 90 tablet 5  . multivitamin (RENA-VIT) TABS tablet Take 1 tablet by mouth every evening.    Marland Kitchen ofloxacin (OCUFLOX) 0.3 % ophthalmic solution Place 1 drop into the right eye 4 (four) times daily.    Marland Kitchen oxyCODONE (ROXICODONE) 5 MG immediate release tablet Take 1 tablet (5 mg total) by mouth every 4 (four) hours as needed. (Patient taking differently: Take 5 mg by mouth every 4 (four) hours as needed for moderate pain.) 15 tablet 0  . pantoprazole (PROTONIX) 40 MG tablet Take 1 tablet (40 mg total) by mouth daily. 30 tablet 3  . polyethylene glycol (MIRALAX / GLYCOLAX) 17 g packet Take 17 g by mouth daily as needed for mild constipation or moderate constipation.     . sucroferric oxyhydroxide (VELPHORO) 500 MG chewable tablet Chew 500 mg by mouth 3 (three) times daily with meals.    . montelukast (SINGULAIR) 10 MG tablet Take 1 tablet (10 mg total) by mouth at bedtime. 30 tablet 5   No current facility-administered medications for this visit.    Allergies:   Dextromethorphan-guaifenesin, Tocotrienols, and Losartan potassium   Social History   Socioeconomic History   . Marital status: Married    Spouse name: Not on file  . Number of children: 2  . Years of education: 54  . Highest education level: Not on file  Occupational History  . 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 Smoking status: Former Smoker    Packs/day: 1.00    Years: 30.00    Pack years: 30.00    Types: Cigarettes    Quit date: 2006    Years since quitting: 16.1  . Smokeless tobacco: Never Used  Vaping Use  . Vaping Use: Never used  Substance and Sexual Activity  . Alcohol use: Yes    Alcohol/week: 1.0 standard drink    Types: 1 Shots of liquor per week    Comment: occasional  . Drug use: Never  . Sexual activity: Not Currently    Partners: Female    Comment: declined condoms 09/2019  Other Topics Concern  . 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   Social Determinants of Health   Financial Resource Strain: Low Risk   . Difficulty of Paying Living Expenses: Not hard at all  Food Insecurity: No Food Insecurity  . Worried About Charity fundraiser in the Last Year: Never true  . Ran Out of Food  in the Last Year: Never true  Transportation Needs: No Transportation Needs  . Lack of Transportation (Medical): No  . Lack of Transportation (Non-Medical): No  Physical Activity: Not on file  Stress: Not on file  Social Connections: Not on file     Family History:  The patient's family history includes Cholelithiasis in his daughter and son; Diabetes in his maternal grandmother; Hypertension in his maternal grandmother and mother; Thyroid disease in his mother.   ROS:   Please see the history of present illness.    All other systems are reviewed and are negative.   PHYSICAL EXAM:   VS:  BP 112/64 (BP Location: Left Arm, Patient  Position: Sitting)   Pulse 76   Ht 6\' 2"  (1.88 m)   Wt 189 lb (85.7 kg)   SpO2 95%   BMI 24.27 kg/m       General: Alert, oriented x3, no distress, appears chronically ill and much weaker than a couple of years ago. Head: no evidence of trauma, PERRL, EOMI, no exophtalmos or lid lag, no myxedema, no xanthelasma; normal ears, nose and oropharynx Neck: normal jugular venous pulsations and no hepatojugular reflux; brisk carotid pulses without delay and no carotid bruits Chest: clear to auscultation, no signs of consolidation by percussion or palpation, normal fremitus, symmetrical and full respiratory excursions Cardiovascular: normal position and quality of the apical impulse, regular rhythm, normal first and paradoxically split second heart sounds, no apical or parasternal murmurs, rubs or gallops Abdomen: no tenderness or distention, no masses by palpation, no abnormal pulsatility or arterial bruits, normal bowel sounds, no hepatosplenomegaly Extremities: no clubbing, cyanosis or edema; right upper arm AV fistula with excellent thrill and bruit. Neurological: grossly nonfocal Psych: Normal mood and affect    Wt Readings from Last 3 Encounters:  01/11/21 189 lb (85.7 kg)  12/02/20 191 lb 12.8 oz (87 kg)  11/24/20 192 lb 6 oz (87.3 kg)      Studies/Labs Reviewed:   ECHO 12/01/2020  1. Left ventricular ejection fraction, by estimation, is 20 to 25%. The left ventricle has severely decreased function. The left ventricle demonstrates global hypokinesis. The left ventricular internal cavity size was moderately dilated. There is mild left ventricular hypertrophy. Left ventricular diastolic parameters are consistent with Grade II diastolic dysfunction (pseudonormalization). Elevated left atrial pressure. 2. Right ventricular systolic function is normal. The right ventricular size is normal. Tricuspid regurgitation signal is inadequate for assessing PA pressure. 3. Left atrial size was  mildly dilated. 4. The mitral valve is normal in structure. Moderate mitral valve regurgitation. 5. The aortic valve was not well visualized. Aortic valve regurgitation is mild to moderate. Mild to moderate aortic valve sclerosis/calcification is present, without any evidence of aortic stenosis. 6. Aortic dilatation noted. Aneurysm of the ascending aorta, measuring 47 mm. 7. The inferior vena cava is normal in size with greater than 50% respiratory variability, suggesting right atrial pressure of 3 mmHg.  EKG:  EKG ordered today shows sinus rhythm with left atrial abnormality, prolonged AV conduction, left bundle branch block with left axis deviation, QRS 128 ms, QTC 540 ms. Recent Labs: 06/28/2020: TSH 1.16 11/30/2020: B Natriuretic Peptide 2,788.5 12/01/2020: ALT 22; BUN 23; Creatinine, Ser 6.02; Potassium 3.7; Sodium 141 12/03/2020: Magnesium 2.0 01/12/2021: Hemoglobin 9.6; Platelets 203   Lipid Panel    Component Value Date/Time   CHOL 194 06/28/2020 1517   CHOL 197 12/31/2017 0945   TRIG 126 06/28/2020 1517   HDL 55 06/28/2020 1517   HDL 47  12/31/2017 0945   CHOLHDL 3.5 06/28/2020 1517   VLDL 6 01/08/2020 0117   LDLCALC 115 (H) 06/28/2020 1517     ASSESSMENT:    1. Chronic combined systolic and diastolic heart failure, NYHA class 3 ([place])   2. Nonischemic dilated cardiomyopathy ([place])   3. Coronary artery disease involving native coronary artery of native heart without angina pectoris   4. Hypercholesterolemia   5. PAF (paroxysmal atrial fibrillation) ([place])   6. LBBB (left bundle branch block)   7. ESRD on hemodialysis ([place])   8. Currently asymptomatic HIV infection, with history of HIV-related illness ([place])   9. Encounter for monitoring amiodarone therapy   10. Long term (current) use of anticoagulants      PLAN:  In order of problems listed above:  1. CHF: He appears clinically euvolemic.  Very close to what he describes at his dry weight.  Blood pressure is good  today.  Unable to provide typical guideline directed medical therapy for heart failure due to profound hemodialysis associated hypotension, requiring the use of vasoconstrictors.  Functional status has been poor ever since his episode of COVID-19 in January 2021.  Not encumbered by shortness of breath which is generalized weakness.  Most recent LVEF remains around 25%.   2. CMP: Primarily nonischemic cardiomyopathy, known to also have mild nonobstructive CAD by previous catheterization.   3. CAD: He does not have angina pectoris.  He is not on aspirin due to full anticoagulation with Eliquis.  No longer on statin (interrupted after his episode of elevated LFTs in July).   4. HLP: Need to repeat a lipid profile soon 5. AFib: On apixaban anticoagulation and amiodarone antiarrhythmic therapy.Ronita Hipps (age 59, HF, history of hypertension) 6. LBBB: Unfortunately not a candidate for CRT due to problems with venous access for dialysis and very high risk of infection. 7. ESRD: Does much better with a 4 times weekly schedule of dialysis, which together with the midodrine has prevented severe hypotension and decompensation.  Dry weight 85 kg. 8. HIV: Well-controlled on chronic antiretroviral therapy (Dr. Tommy Medal). 9. Amiodarone: Liver function tests have returned completely to normal as of December.  TSH was normal in July and is due to be rechecked. 10. Eliquis: No bleeding complications, other than prolonged time to hemostasis after dialysis.  Medication Adjustments/Labs and Tests Ordered: Current medicines are reviewed at length with the patient today.  Concerns regarding medicines are outlined above.  Medication changes, Labs and Tests ordered today are listed below. Patient Instructions  Medication Instructions:  No changes *If you need a refill on your cardiac medications before your next appointment, please call your pharmacy*   Lab Work: Your provider would like for you to have the following  labs today: TSH  If you have labs (blood work) drawn today and your tests are completely normal, you will receive your results only by: Marland Kitchen MyChart Message (if you have MyChart) OR . A paper copy in the mail If you have any lab test that is abnormal or we need to change your treatment, we will call you to review the results.   Testing/Procedures: None ordered   Follow-Up: At [facility], you and your health needs are our priority.  As part of our continuing mission to provide you with exceptional heart care, we have created designated Provider Care Teams.  These Care Teams include your primary Cardiologist (physician) and Advanced Practice Providers (APPs -  Physician Assistants and Nurse Practitioners) who all work together to provide you  with the care you need, when you need it.  We recommend signing up for the patient portal called "MyChart".  Sign up information is provided on this After Visit Summary.  MyChart is used to connect with patients for Virtual Visits (Telemedicine).  Patients are able to view lab/test results, encounter notes, upcoming appointments, etc.  Non-urgent messages can be sent to your provider as well.   To learn more about what you can do with MyChart, go to [email or web address].    Your next appointment:   6 month(s)  The format for your next appointment:   In Person  Provider:   You may see [name], MD or one of the following Advanced Practice Providers on your designated Care Team:    Almyra Deforest, PA-C  Fabian Sharp, [place] or   Roby Lofts, PA-C         Signed, [name], MD  01/16/2021 12:44 PM    [address], [place], [place]  [address] Phone: [phone]; Fax: [phone]

## 2021-01-12 ENCOUNTER — Other Ambulatory Visit: Payer: Medicare HMO

## 2021-01-12 DIAGNOSIS — D509 Iron deficiency anemia, unspecified: Secondary | ICD-10-CM | POA: Diagnosis not present

## 2021-01-12 DIAGNOSIS — Z21 Asymptomatic human immunodeficiency virus [HIV] infection status: Secondary | ICD-10-CM | POA: Diagnosis not present

## 2021-01-12 DIAGNOSIS — N186 End stage renal disease: Secondary | ICD-10-CM | POA: Diagnosis not present

## 2021-01-12 DIAGNOSIS — I132 Hypertensive heart and chronic kidney disease with heart failure and with stage 5 chronic kidney disease, or end stage renal disease: Secondary | ICD-10-CM | POA: Diagnosis not present

## 2021-01-12 DIAGNOSIS — J440 Chronic obstructive pulmonary disease with acute lower respiratory infection: Secondary | ICD-10-CM | POA: Diagnosis not present

## 2021-01-12 DIAGNOSIS — I083 Combined rheumatic disorders of mitral, aortic and tricuspid valves: Secondary | ICD-10-CM | POA: Diagnosis not present

## 2021-01-12 DIAGNOSIS — I0981 Rheumatic heart failure: Secondary | ICD-10-CM | POA: Diagnosis not present

## 2021-01-12 DIAGNOSIS — D631 Anemia in chronic kidney disease: Secondary | ICD-10-CM | POA: Diagnosis not present

## 2021-01-12 DIAGNOSIS — I5043 Acute on chronic combined systolic (congestive) and diastolic (congestive) heart failure: Secondary | ICD-10-CM | POA: Diagnosis not present

## 2021-01-12 DIAGNOSIS — Z113 Encounter for screening for infections with a predominantly sexual mode of transmission: Secondary | ICD-10-CM | POA: Diagnosis not present

## 2021-01-12 DIAGNOSIS — B20 Human immunodeficiency virus [HIV] disease: Secondary | ICD-10-CM | POA: Diagnosis not present

## 2021-01-12 DIAGNOSIS — M1030 Gout due to renal impairment, unspecified site: Secondary | ICD-10-CM | POA: Diagnosis not present

## 2021-01-13 DIAGNOSIS — N186 End stage renal disease: Secondary | ICD-10-CM | POA: Diagnosis not present

## 2021-01-13 DIAGNOSIS — Z992 Dependence on renal dialysis: Secondary | ICD-10-CM | POA: Diagnosis not present

## 2021-01-13 DIAGNOSIS — N2581 Secondary hyperparathyroidism of renal origin: Secondary | ICD-10-CM | POA: Diagnosis not present

## 2021-01-13 LAB — T-HELPER CELL (CD4) - (RCID CLINIC ONLY)
CD4 % Helper T Cell: 31 % — ABNORMAL LOW (ref 33–65)
CD4 T Cell Abs: 445 /uL (ref 400–1790)

## 2021-01-15 DIAGNOSIS — N186 End stage renal disease: Secondary | ICD-10-CM | POA: Diagnosis not present

## 2021-01-15 DIAGNOSIS — N2581 Secondary hyperparathyroidism of renal origin: Secondary | ICD-10-CM | POA: Diagnosis not present

## 2021-01-15 DIAGNOSIS — Z992 Dependence on renal dialysis: Secondary | ICD-10-CM | POA: Diagnosis not present

## 2021-01-15 LAB — CBC WITH DIFFERENTIAL/PLATELET
Absolute Monocytes: 706 cells/uL (ref 200–950)
Basophils Absolute: 78 cells/uL (ref 0–200)
Basophils Relative: 1.4 %
Eosinophils Absolute: 554 cells/uL — ABNORMAL HIGH (ref 15–500)
Eosinophils Relative: 9.9 %
HCT: 29.6 % — ABNORMAL LOW (ref 38.5–50.0)
Hemoglobin: 9.6 g/dL — ABNORMAL LOW (ref 13.2–17.1)
Lymphs Abs: 1602 cells/uL (ref 850–3900)
MCH: 33.3 pg — ABNORMAL HIGH (ref 27.0–33.0)
MCHC: 32.4 g/dL (ref 32.0–36.0)
MCV: 102.8 fL — ABNORMAL HIGH (ref 80.0–100.0)
MPV: 10.1 fL (ref 7.5–12.5)
Monocytes Relative: 12.6 %
Neutro Abs: 2660 cells/uL (ref 1500–7800)
Neutrophils Relative %: 47.5 %
Platelets: 203 10*3/uL (ref 140–400)
RBC: 2.88 10*6/uL — ABNORMAL LOW (ref 4.20–5.80)
RDW: 15 % (ref 11.0–15.0)
Total Lymphocyte: 28.6 %
WBC: 5.6 10*3/uL (ref 3.8–10.8)

## 2021-01-15 LAB — RPR TITER: RPR Titer: 1:2 {titer} — ABNORMAL HIGH

## 2021-01-15 LAB — RPR: RPR Ser Ql: REACTIVE — AB

## 2021-01-15 LAB — HIV-1 RNA QUANT-NO REFLEX-BLD
HIV 1 RNA Quant: <20 Copies/mL
HIV-1 RNA Quant, Log: <1.3 Log cps/mL

## 2021-01-15 LAB — FLUORESCENT TREPONEMAL AB(FTA)-IGG-BLD: Fluorescent Treponemal ABS: REACTIVE — AB

## 2021-01-17 DIAGNOSIS — Z992 Dependence on renal dialysis: Secondary | ICD-10-CM | POA: Diagnosis not present

## 2021-01-17 DIAGNOSIS — N2581 Secondary hyperparathyroidism of renal origin: Secondary | ICD-10-CM | POA: Diagnosis not present

## 2021-01-17 DIAGNOSIS — N186 End stage renal disease: Secondary | ICD-10-CM | POA: Diagnosis not present

## 2021-01-18 DIAGNOSIS — N2581 Secondary hyperparathyroidism of renal origin: Secondary | ICD-10-CM | POA: Diagnosis not present

## 2021-01-18 DIAGNOSIS — Z992 Dependence on renal dialysis: Secondary | ICD-10-CM | POA: Diagnosis not present

## 2021-01-18 DIAGNOSIS — N186 End stage renal disease: Secondary | ICD-10-CM | POA: Diagnosis not present

## 2021-01-20 ENCOUNTER — Other Ambulatory Visit: Payer: Self-pay | Admitting: Cardiovascular Disease

## 2021-01-20 DIAGNOSIS — N186 End stage renal disease: Secondary | ICD-10-CM | POA: Diagnosis not present

## 2021-01-20 DIAGNOSIS — N2581 Secondary hyperparathyroidism of renal origin: Secondary | ICD-10-CM | POA: Diagnosis not present

## 2021-01-20 DIAGNOSIS — Z992 Dependence on renal dialysis: Secondary | ICD-10-CM | POA: Diagnosis not present

## 2021-01-21 ENCOUNTER — Telehealth: Payer: Self-pay | Admitting: Cardiovascular Disease

## 2021-01-21 NOTE — Telephone Encounter (Signed)
Pt c/o medication issue:  1. Name of Medication: ELIQUIS 5 MG TABS tablet  2. How are you currently taking this medication (dosage and times per day)? As prescribed  3. Are you having a reaction (difficulty breathing--STAT)? No   4. What is your medication issue?   Patient states he is no longer able to afford this medication.  Per patient, initially the medication was $26 and now it has increased to $400+ and he is no longer able to afford it. He would like to know if we are able to offer an alternative or assistance with purchasing. Patient also states he took his last tablet yesterday, 01/20/21.

## 2021-01-21 NOTE — Telephone Encounter (Signed)
Spoke with patient who states that his Eliquis is not 400$. Patient is unsure why his Eliquis is now costing more money. Patient reports never filling out Patient assistance application but states he would like to try. Advised patient that 2 boxes of 5mg  Eliquis will be left at the front desk for pick up, along with patient assistance application. Advised patient to fill out application and bring back to office upon completion so it can be faxed to company. Advised patient of the importance of not missing doses of Eliquis. Patient verbalized understanding of all instructions.   Samples of Eliquis 5 mg were given to the patient, quantity 2 boxes, Lot Number [id number], Exp 4/24

## 2021-01-22 DIAGNOSIS — N2581 Secondary hyperparathyroidism of renal origin: Secondary | ICD-10-CM | POA: Diagnosis not present

## 2021-01-22 DIAGNOSIS — N186 End stage renal disease: Secondary | ICD-10-CM | POA: Diagnosis not present

## 2021-01-22 DIAGNOSIS — Z992 Dependence on renal dialysis: Secondary | ICD-10-CM | POA: Diagnosis not present

## 2021-01-24 DIAGNOSIS — Z992 Dependence on renal dialysis: Secondary | ICD-10-CM | POA: Diagnosis not present

## 2021-01-24 DIAGNOSIS — N186 End stage renal disease: Secondary | ICD-10-CM | POA: Diagnosis not present

## 2021-01-24 DIAGNOSIS — N2581 Secondary hyperparathyroidism of renal origin: Secondary | ICD-10-CM | POA: Diagnosis not present

## 2021-01-25 DIAGNOSIS — Z992 Dependence on renal dialysis: Secondary | ICD-10-CM | POA: Diagnosis not present

## 2021-01-25 DIAGNOSIS — N2581 Secondary hyperparathyroidism of renal origin: Secondary | ICD-10-CM | POA: Diagnosis not present

## 2021-01-25 DIAGNOSIS — N186 End stage renal disease: Secondary | ICD-10-CM | POA: Diagnosis not present

## 2021-01-26 ENCOUNTER — Telehealth: Payer: Self-pay | Admitting: Family

## 2021-01-26 DIAGNOSIS — N186 End stage renal disease: Secondary | ICD-10-CM | POA: Diagnosis not present

## 2021-01-26 DIAGNOSIS — J440 Chronic obstructive pulmonary disease with acute lower respiratory infection: Secondary | ICD-10-CM | POA: Diagnosis not present

## 2021-01-26 DIAGNOSIS — I5043 Acute on chronic combined systolic (congestive) and diastolic (congestive) heart failure: Secondary | ICD-10-CM | POA: Diagnosis not present

## 2021-01-26 DIAGNOSIS — M1030 Gout due to renal impairment, unspecified site: Secondary | ICD-10-CM | POA: Diagnosis not present

## 2021-01-26 DIAGNOSIS — I132 Hypertensive heart and chronic kidney disease with heart failure and with stage 5 chronic kidney disease, or end stage renal disease: Secondary | ICD-10-CM | POA: Diagnosis not present

## 2021-01-26 DIAGNOSIS — I083 Combined rheumatic disorders of mitral, aortic and tricuspid valves: Secondary | ICD-10-CM | POA: Diagnosis not present

## 2021-01-26 DIAGNOSIS — D509 Iron deficiency anemia, unspecified: Secondary | ICD-10-CM | POA: Diagnosis not present

## 2021-01-26 DIAGNOSIS — D631 Anemia in chronic kidney disease: Secondary | ICD-10-CM | POA: Diagnosis not present

## 2021-01-26 DIAGNOSIS — I0981 Rheumatic heart failure: Secondary | ICD-10-CM | POA: Diagnosis not present

## 2021-01-26 NOTE — Telephone Encounter (Signed)
David [name] is requesting for a verbal order  one more day of PT next week for Training to get in an out the shower

## 2021-01-26 NOTE — Telephone Encounter (Signed)
Verbal orders given for additional therapy next week.

## 2021-01-27 ENCOUNTER — Ambulatory Visit (INDEPENDENT_AMBULATORY_CARE_PROVIDER_SITE_OTHER): Payer: Medicare HMO | Admitting: Infectious Disease

## 2021-01-27 ENCOUNTER — Other Ambulatory Visit: Payer: Self-pay

## 2021-01-27 ENCOUNTER — Encounter: Payer: Self-pay | Admitting: Infectious Disease

## 2021-01-27 VITALS — BP 158/74 | HR 80 | Temp 97.6°F

## 2021-01-27 DIAGNOSIS — I420 Dilated cardiomyopathy: Secondary | ICD-10-CM

## 2021-01-27 DIAGNOSIS — Z992 Dependence on renal dialysis: Secondary | ICD-10-CM

## 2021-01-27 DIAGNOSIS — I5042 Chronic combined systolic (congestive) and diastolic (congestive) heart failure: Secondary | ICD-10-CM

## 2021-01-27 DIAGNOSIS — C61 Malignant neoplasm of prostate: Secondary | ICD-10-CM | POA: Diagnosis not present

## 2021-01-27 DIAGNOSIS — B20 Human immunodeficiency virus [HIV] disease: Secondary | ICD-10-CM

## 2021-01-27 DIAGNOSIS — A529 Late syphilis, unspecified: Secondary | ICD-10-CM

## 2021-01-27 DIAGNOSIS — N186 End stage renal disease: Secondary | ICD-10-CM

## 2021-01-27 DIAGNOSIS — I482 Chronic atrial fibrillation, unspecified: Secondary | ICD-10-CM | POA: Diagnosis not present

## 2021-01-27 DIAGNOSIS — N2581 Secondary hyperparathyroidism of renal origin: Secondary | ICD-10-CM | POA: Diagnosis not present

## 2021-01-27 DIAGNOSIS — U071 COVID-19: Secondary | ICD-10-CM | POA: Diagnosis not present

## 2021-01-27 NOTE — Progress Notes (Signed)
Chief complaint: Follow-up for HIV disease, on medications  Subjective:    Patient ID: David [name], male    DOB: 02/25/1941, 80 y.o.   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    He was recently hospitalized with volume overload and pneumonia per DC summary and is now needing HD 4 X per week.     Past Medical History:  Diagnosis Date  . 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 Acute on chronic systolic and diastolic heart failure, NYHA class 4 ([place])   . Anemia, iron deficiency 11/15/2011  . Arthritis    "hands, right knee, feet" (02/21/2018)  . Atrial fibrillation ([place])   . Cardiac arrest ([place]) 02/17/2018  . CHF (congestive heart failure) ([place])   . Chronic combined systolic and diastolic heart failure, NYHA class 3 ([place]) 06/2010   felt to be secondary to hypertensive cardiomyopathy  . Chronic lower back pain   . CKD (chronic kidney disease) stage V requiring chronic dialysis ([place])   . COVID-19 12/2019  . ESRD on dialysis [facility])    started 02/2018 [place]  . GERD (gastroesophageal reflux disease)   . Glaucoma    "I had surgery and dont have it any more"  . Gout    daily RX (02/21/2018)  . Heart murmur    "mild" per pt  . Hepatitis    years ago  . HIV (human immunodeficiency virus infection) ([place])   . HIV infection ([place])   . Hyperlipidemia   . Hypertension   . Nonischemic cardiomyopathy ([place])   . Pneumonia 11/2017  . Prostate cancer ([place])    hx of prostate; s/p radioactive seed implant 10/2009 Dr Janice Norrie  . Sinus bradycardia   . Sleep apnea    does not use a cpap  . Stroke [facility])    "mini stroke"  years ago  . Wears glasses     Past Surgical History:  Procedure Laterality Date  . A/V FISTULAGRAM Bilateral 07/30/2020   Procedure: CENTRAL VENOGRAM;  Surgeon: Angelia Mould, MD;  Location: [place] [facility];  Service: Cardiovascular;  Laterality: Bilateral;  bilateral upper venogram   . AV FISTULA PLACEMENT Right 10/13/2016   Procedure: ARTERIOVENOUS (AV) FISTULA CREATION;  Surgeon: [name], MD;  Location: [place];  Service: Vascular;  Laterality: Right;  . AV FISTULA PLACEMENT Left 02/25/2018   Procedure: INSERTION OF ARTERIOVENOUS (AV) GORE-TEX GRAFT LEFT UPPER ARM;  Surgeon: Angelia Mould, MD;  Location: [place];  Service: Vascular;  Laterality: Left;  . AV FISTULA PLACEMENT Right 08/07/2018   Procedure: Creation of right arm brachiocephalic Fistula;  Surgeon: Waynetta Sandy, MD;  Location: [place];  Service: Vascular;  Laterality: Right;  . AV FISTULA PLACEMENT Left 11/10/2019   Procedure: INSERTION OF ARTERIOVENOUS (AV) GORE-TEX GRAFT THIGH;  Surgeon: Elam Dutch, MD;  Location: [place];  Service: Vascular;  Laterality: Left;  . AV FISTULA PLACEMENT Right 12/02/2019   Procedure: INSERTION OF ARTERIOVENOUS (AV) GORE-TEX GRAFT RIGHT  THIGH;  Surgeon: Waynetta Sandy, MD;  Location: [place];  Service: Vascular;  Laterality: Right;  . AV FISTULA PLACEMENT Right 08/13/2020   Procedure: INSERTION OF RIGHT  ARTERIOVENOUS (AV) GORE-TEX GRAFT ARM;  Surgeon: Angelia Mould, MD;  Location: [facility] OR;  Service: Vascular;  Laterality: Right;  . BASCILIC VEIN TRANSPOSITION Left 06/24/2015   Procedure: BASILIC VEIN TRANSPOSITION;  Surgeon: Mal Misty, MD;  Location: [place];  Service: Vascular;  Laterality: Left;  . BIOPSY  03/14/2019   Procedure: BIOPSY;  Surgeon: Irene Shipper, MD;  Location: [place];  Service: Endoscopy;;  . CATARACT EXTRACTION, BILATERAL Bilateral   . COLONOSCOPY WITH PROPOFOL N/A 03/14/2019   Procedure: COLONOSCOPY WITH PROPOFOL;   Surgeon: Irene Shipper, MD;  Location[facility] END[facility]COPY;  Service: Endoscopy;  Laterality: N/A;  . ESOPHAGOGASTRODUODENOSCOPY (EGD) WITH PROPOFOL N/A 05/05/2014   Procedure: ESOPHAGOGASTRODUODENOSCOPY (EGD) WITH PROPOFOL;  Surgeon: Missy Sabins, MD;  Location: [facility]OSCOPY;  Service: Endoscopy;  Laterality: N/A;  . EYE SURGERY Bilateral    cataract surgery   . GLAUCOMA SURGERY Bilateral   . INSERTION OF ARTERIOVENOUS (AV) ARTEGRAFT ARM  10/09/2018   Procedure: INSERTION OF ARTERIOVENOUS (AV) 41mm x 41cm ARTEGRAFT LEFT UPPER ARM;  Surgeon: Waynetta Sandy, MD;  Location: [place];  Service: Vascular;;  . INSERTION OF DIALYSIS CATHETER Right 07/14/2019   Procedure: Insertion Of Dialysis Catheter;  Surgeon: Elam Dutch, MD;  Location: [facility] OR;  Service: Vascular;  Laterality: Right;  placed right Internal Jugular  . INSERTION OF DIALYSIS CATHETER Left 08/13/2020   Procedure: INSERTION OF DIALYSIS CATHETER;  Surgeon: Angelia Mould, MD;  Location: [place];  Service: Vascular;  Laterality: Left;  . INSERTION PROSTATE RADIATION SEED    . IR FLUORO GUIDE CV LINE RIGHT  02/18/2018  . IR REMOVAL TUN CV CATH W/O FL  12/06/2018  . IR THROMBECTOMY AV FISTULA W/THROMBOLYSIS INC/SHUNT/IMG RIGHT  11/12/2020  . IR THROMBECTOMY AV FISTULA W/THROMBOLYSIS/PTA INC/SHUNT/IMG RIGHT Right 01/26/2020  . IR THROMBECTOMY AV FISTULA W/THROMBOLYSIS/PTA INC/SHUNT/IMG RIGHT Right 02/24/2020  . IR THROMBECTOMY AV FISTULA W/THROMBOLYSIS/PTA INC/SHUNT/IMG RIGHT Right 04/07/2020  . IR US GUIDE VASC ACCESS RIGHT  02/18/2018  . IR US GUIDE VASC ACCESS RIGHT  01/26/2020  . IR US GUIDE VASC ACCESS RIGHT  04/07/2020  . IR US GUIDE VASC ACCESS RIGHT  11/12/2020  . LEFT HEART CATH AND CORONARY ANGIOGRAPHY N/A 02/20/2018   Procedure: LEFT HEART CATH AND CORONARY ANGIOGRAPHY;  Surgeon: Jettie Booze, MD;  Location: [place] [facility];;; 50% ostial OM2, 25% mLAD.  Marland Kitchen LEFT HEART CATH AND CORONARY ANGIOGRAPHY  2004   mildly depressed  LV systolic fx EF 12%,XNTZGY coronaries/abdominal aorta/renal arteries.  . LOWER EXTREMITY ANGIOGRAM Left 11/17/2019   Procedure: Lower Extremity Fistulogram;  Surgeon: Marty Heck, MD;  Location: [place];  Service: Vascular;  Laterality: Left;  . NM [place]  02/21/2010   No ischemia or infarction.  EF 27%.  Marland Kitchen RADIOACTIVE SEED IMPLANT  2010   prostate cancer  . REMOVAL OF A DIALYSIS CATHETER Right 08/13/2020   Procedure: REMOVAL OF A DIALYSIS CATHETER;  Surgeon: Angelia Mould, MD;  [facility] OR;  Service: Vascular;  Laterality: Right;  . REVISION OF ARTERIOVENOUS GORETEX GRAFT Right 07/11/2019   Procedure: REVISION OF ARTERIOVENOUS GORETEX GRAFT RIGHT ARM WITH ARTEGRAFT;  Surgeon: Serafina Mitchell, MD;  Location: [place];  Service: Vascular;  Laterality: Right;  . SHUNTOGRAM Right 07/14/2019   Procedure: Shuntogram;  Surgeon: Elam Dutch, MD;  Location: [place];  Service: Vascular;  Laterality: Right;  . THROMBECTOMY AND REVISION OF ARTERIOVENTOUS (AV) GORETEX  GRAFT Right 07/14/2019  Procedure: THROMBECTOMY AND REVISION OF ARTERIOVENTOUS (AV) GORETEX  GRAFT;  Surgeon: Elam Dutch, MD;  Location: [place];  Service: Vascular;  Laterality: Right;  . THROMBECTOMY W/ EMBOLECTOMY Left 02/26/2018   Procedure: THROMBECTOMY ARTERIOVENOUS GRAFT;  Surgeon: Angelia Mould, MD;  Location: [place];  Service: Vascular;  Laterality: Left;  . THROMBECTOMY W/ EMBOLECTOMY Left 11/17/2019   Procedure: THROMBECTOMY ARTERIOVENOUS GRAFT LEFT THIGH;  Surgeon: Marty Heck, MD;  Location: [place];  Service: Vascular;  Laterality: Left;  . TRANSTHORACIC ECHOCARDIOGRAM  02/2012   [facility]) EF 45-50% with mild global HK.  Mild LVH,LA mod. dilated,mild-mod. 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 AI.  Marland Kitchen TRANSTHORACIC ECHOCARDIOGRAM  9/'18; 3/'19   a) Severely reduced EF.  25 from 30%.  GR 1 DD with diffuse ecchymosis. Mild AS & AI.  Mild LA/RA  dilation; b) (post PEA arest):   Moderately dilated LV with moderate concentric virtually.  EF 15 to 20%.  GR 1 DD.  Paradoxical septal motion. Mild AI.  Mild LA dilation.  Poorly visualized RV.  Marland Kitchen TRANSTHORACIC ECHOCARDIOGRAM  02/2019   EF 20-25%.  Unable to assess diastolic function (A. Fib).  No LV apical thrombus.  Mild AI.  Mildly reduced RV function, however poorly visualized.  Marland Kitchen ULTRASOUND GUIDANCE FOR VASCULAR ACCESS  02/20/2018   Procedure: Ultrasound Guidance For Vascular Access;  Surgeon: Jettie Booze, MD;  Location: [place] [facility];  Service: Cardiovascular;;  . UPPER EXTREMITY VENOGRAPHY N/A 07/08/2018   Procedure: UPPER EXTREMITY VENOGRAPHY;  Surgeon: Waynetta Sandy, MD;  Location: [place] [facility];  Service: Cardiovascular;  Laterality: N/A;  Bilateral    Family History  Problem Relation Age of Onset  . Hypertension Mother   . Thyroid disease Mother   . Cholelithiasis Daughter   . Cholelithiasis Son   . Hypertension Maternal Grandmother   . Diabetes Maternal Grandmother   . Heart attack Neg Hx   . Hyperlipidemia Neg Hx       Social History   Socioeconomic History  . Marital status: Married    Spouse name: Not on file  . Number of children: 2  . Years of education: 81  . Highest education level: Not on file  Occupational History  . 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 Smoking status: Former Smoker    Packs/day: 1.00    Years: 30.00    Pack years: 30.00    Types: Cigarettes    Quit date: 2006    Years since quitting: 16.1  . Smokeless tobacco: Never Used  Vaping Use  . Vaping Use: Never used  Substance and Sexual Activity  . Alcohol use: Yes    Alcohol/week: 1.0 standard drink    Types: 1 Shots of liquor per week    Comment: occasional  . Drug use: Never  . Sexual activity: Not Currently    Partners: Female    Comment: declined condoms 09/2019  Other Topics Concern  . Not on file  Social  History Narrative   Retired, 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   Social Determinants of Health   Financial Resource Strain: Low Risk   . Difficulty of  Paying Living Expenses: Not hard at all  Food Insecurity: No Food Insecurity  . Worried About Charity fundraiser in the Last Year: Never true  . Ran Out of Food in the Last Year: Never true  Transportation Needs: No Transportation Needs  . Lack of Transportation (Medical): No  . Lack of Transportation (Non-Medical): No  Physical Activity: Not on file  Stress: Not on file  Social Connections: Not on file    Allergies  Allergen Reactions  . Dextromethorphan-Guaifenesin Other (See Comments)    Unknown reaction  . Tocotrienols Other (See Comments)    Unknown reaction  . Losartan Potassium Other (See Comments)    Causes constipation     Current Outpatient Medications:  .  acetaminophen (TYLENOL) 500 MG tablet, Take 1,000 mg by mouth every 6 (six) hours as needed for moderate pain., Disp: , Rfl:  .  allopurinol (ZYLOPRIM) 100 MG tablet, TAKE 1 TABLET BY MOUTH 4 TIMES WEEKLY( MONDAY, TUESDAY,THURDSAY AND SATURDAY) AFTER DIALYSIS (Patient taking differently: Take 100 mg by mouth See admin instructions. TAKE 1 TABLET BY MOUTH 4 TIMES WEEKLY( MONDAY, TUESDAY,THURDSAY AND SATURDAY) AFTER DIALYSIS), Disp: 45 tablet, Rfl: 5 .  amiodarone (PACERONE) 200 MG tablet, TAKE 1 TABLET(200 MG) BY MOUTH DAILY (Patient taking differently: Take 200 mg by mouth daily.), Disp: 90 tablet, Rfl: 3 .  [name] 50-200-25 MG TABS tablet, TAKE 1 TABLET BY MOUTH DAILY, Disp: 30 tablet, Rfl: 5 .  brimonidine (ALPHAGAN P) 0.1 % SOLN, Place 1 drop into the right eye 2 (two) times daily., Disp: , Rfl:  .  dorzolamide-timolol (COSOPT) 22.3-6.8 MG/ML ophthalmic solution, Place 1 drop into the right eye 2 (two)  times daily., Disp: , Rfl:  .  ELIQUIS 5 MG TABS tablet, TAKE 1 TABLET(5 MG) BY MOUTH TWICE DAILY, Disp: 180 tablet, Rfl: 1 .  heparin 1000 unit/mL SOLN injection, Heparin Sodium (Porcine) 1,000 Units/mL Catheter Lock Arterial, Disp: , Rfl:  .  lidocaine-prilocaine (EMLA) cream, Apply 1 application topically See admin instructions. Use on dialysis days, Disp: , Rfl:  .  midodrine (PROAMATINE) 10 MG tablet, Take 1 tablet (10 mg total) by mouth as directed. Twice a day on the day of HD Tuesday, Thursday, Saturday (1 in AM and 1 at [place]). Take 1/2 tablet (5mg ) a day on non HD days (Patient taking differently: Take 10-20 mg by mouth See admin instructions. Take 10 mg in the morning at 5 AM. take 10 mg at 6AM of dialysis days and 10 mg half way through treatment on dialysis days (Mon, Tues, Thurs, and Sat)), Disp: 90 tablet, Rfl: 5 .  montelukast (SINGULAIR) 10 MG tablet, Take 1 tablet (10 mg total) by mouth at bedtime., Disp: 30 tablet, Rfl: 5 .  multivitamin (RENA-VIT) TABS tablet, Take 1 tablet by mouth every evening., Disp: , Rfl:  .  ofloxacin (OCUFLOX) 0.3 % ophthalmic solution, Place 1 drop into the right eye 4 (four) times daily., Disp: , Rfl:  .  oxyCODONE (ROXICODONE) 5 MG immediate release tablet, Take 1 tablet (5 mg total) by mouth every 4 (four) hours as needed. (Patient taking differently: Take 5 mg by mouth every 4 (four) hours as needed for moderate pain.), Disp: 15 tablet, Rfl: 0 .  polyethylene glycol (MIRALAX / GLYCOLAX) 17 g packet, Take 17 g by mouth daily as needed for mild constipation or moderate constipation. , Disp: , Rfl:  .  sucroferric oxyhydroxide (VELPHORO) 500 MG chewable tablet, Chew 500 mg by mouth 3 (three) times daily  with meals., Disp: , Rfl:  .  HYDROcodone-acetaminophen (NORCO/VICODIN) 5-325 MG tablet, Take 1-2 tablets by mouth every 6 (six) hours as needed for moderate pain. (Patient not taking: Reported on 01/27/2021), Disp: 14 tablet, Rfl: 0 .  pantoprazole (PROTONIX)  40 MG tablet, Take 1 tablet (40 mg total) by mouth daily. (Patient not taking: Reported on 01/27/2021), Disp: 30 tablet, Rfl: 3   Review of Systems  Constitutional: Negative for activity change, appetite change, chills, fever and unexpected weight change.  HENT: Negative for congestion, rhinorrhea, sinus pressure, sneezing, sore throat and trouble swallowing.   Respiratory: Negative for cough.   Cardiovascular: Negative for chest pain, palpitations and leg swelling.  Gastrointestinal: Negative for abdominal distention, abdominal pain, constipation, nausea and vomiting.  Genitourinary: Negative for hematuria.  Musculoskeletal: Negative for myalgias.  Skin: Negative for color change, pallor, rash and wound.  Neurological: Negative for dizziness, tremors and light-headedness.  Hematological: Negative for adenopathy. Does not bruise/bleed easily.  Psychiatric/Behavioral: Negative for agitation, behavioral problems, confusion, decreased concentration and sleep disturbance.       Objective:   Physical Exam Constitutional:      General: He is not in acute distress.    Appearance: He is well-developed. He is not diaphoretic.  HENT:     Head: Normocephalic and atraumatic.     Mouth/Throat:     Pharynx: No oropharyngeal exudate.  Eyes:     General: No scleral icterus.    Conjunctiva/sclera: Conjunctivae normal.  Cardiovascular:     Rate and Rhythm: Normal rate and regular rhythm.     Heart sounds: Normal heart sounds. No murmur heard. No friction rub. No gallop.   Pulmonary:     Effort: Pulmonary effort is normal. No respiratory distress.     Breath sounds: Normal breath sounds.  Abdominal:     General: Bowel sounds are normal. There is no distension.     Palpations: Abdomen is soft.     Tenderness: There is no abdominal tenderness. There is no rebound.  Musculoskeletal:        General: No tenderness.     Cervical back: Normal range of motion and neck supple.  Skin:    General: Skin  is warm and dry.     Coloration: Skin is not pale.     Findings: No erythema.  Neurological:     Mental Status: He is alert and oriented to person, place, and time.     Motor: No abnormal muscle tone.     Coordination: Coordination normal.     Deep Tendon Reflexes: Reflexes are normal and symmetric.  Psychiatric:        Speech: Speech is delayed.        Behavior: Behavior normal.        Thought Content: Thought content normal.        Judgment: Judgment normal.     Had walker chair again with him      Assessment & Plan:    HIV  Well controlled on [name]. He was with his wife. I apparently upset her a bit by talking about "U=U" , so I will steer clear of this point since they understand it  CKD: followed by Dr Posey Pronto now on hemodialysis  CHF and atrial fibrillation: following with Dr. Orene Desanctis  HTN: Followed PCP  Prostate cancer status post implanted radioactive beads

## 2021-01-29 DIAGNOSIS — N2581 Secondary hyperparathyroidism of renal origin: Secondary | ICD-10-CM | POA: Diagnosis not present

## 2021-01-29 DIAGNOSIS — Z992 Dependence on renal dialysis: Secondary | ICD-10-CM | POA: Diagnosis not present

## 2021-01-29 DIAGNOSIS — N186 End stage renal disease: Secondary | ICD-10-CM | POA: Diagnosis not present

## 2021-01-31 DIAGNOSIS — B20 Human immunodeficiency virus [HIV] disease: Secondary | ICD-10-CM | POA: Diagnosis not present

## 2021-01-31 DIAGNOSIS — N2581 Secondary hyperparathyroidism of renal origin: Secondary | ICD-10-CM | POA: Diagnosis not present

## 2021-01-31 DIAGNOSIS — Z992 Dependence on renal dialysis: Secondary | ICD-10-CM | POA: Diagnosis not present

## 2021-01-31 DIAGNOSIS — N186 End stage renal disease: Secondary | ICD-10-CM | POA: Diagnosis not present

## 2021-02-01 DIAGNOSIS — Z992 Dependence on renal dialysis: Secondary | ICD-10-CM | POA: Diagnosis not present

## 2021-02-01 DIAGNOSIS — N2581 Secondary hyperparathyroidism of renal origin: Secondary | ICD-10-CM | POA: Diagnosis not present

## 2021-02-01 DIAGNOSIS — N186 End stage renal disease: Secondary | ICD-10-CM | POA: Diagnosis not present

## 2021-02-01 NOTE — Telephone Encounter (Signed)
Patient following up. Only has 3-4 more pills left.

## 2021-02-01 NOTE — Telephone Encounter (Signed)
Inform pt samples placed at front desk for pick up.  Eliquis 5 mg 2 boxes Lot # [id number] Exp: 4/24

## 2021-02-02 ENCOUNTER — Other Ambulatory Visit: Payer: Self-pay | Admitting: *Deleted

## 2021-02-02 ENCOUNTER — Telehealth: Payer: Self-pay | Admitting: *Deleted

## 2021-02-02 MED ORDER — APIXABAN 5 MG PO TABS: ORAL_TABLET | ORAL | 3 refills | Status: DC

## 2021-02-02 NOTE — Telephone Encounter (Signed)
Eliquis Assistance faxed to [facility]

## 2021-02-03 DIAGNOSIS — I083 Combined rheumatic disorders of mitral, aortic and tricuspid valves: Secondary | ICD-10-CM | POA: Diagnosis not present

## 2021-02-03 DIAGNOSIS — N2581 Secondary hyperparathyroidism of renal origin: Secondary | ICD-10-CM | POA: Diagnosis not present

## 2021-02-03 DIAGNOSIS — Z992 Dependence on renal dialysis: Secondary | ICD-10-CM | POA: Diagnosis not present

## 2021-02-03 DIAGNOSIS — I5043 Acute on chronic combined systolic (congestive) and diastolic (congestive) heart failure: Secondary | ICD-10-CM | POA: Diagnosis not present

## 2021-02-03 DIAGNOSIS — N186 End stage renal disease: Secondary | ICD-10-CM | POA: Diagnosis not present

## 2021-02-03 DIAGNOSIS — J440 Chronic obstructive pulmonary disease with acute lower respiratory infection: Secondary | ICD-10-CM | POA: Diagnosis not present

## 2021-02-03 DIAGNOSIS — D631 Anemia in chronic kidney disease: Secondary | ICD-10-CM | POA: Diagnosis not present

## 2021-02-03 DIAGNOSIS — D509 Iron deficiency anemia, unspecified: Secondary | ICD-10-CM | POA: Diagnosis not present

## 2021-02-03 DIAGNOSIS — M1030 Gout due to renal impairment, unspecified site: Secondary | ICD-10-CM | POA: Diagnosis not present

## 2021-02-03 DIAGNOSIS — I132 Hypertensive heart and chronic kidney disease with heart failure and with stage 5 chronic kidney disease, or end stage renal disease: Secondary | ICD-10-CM | POA: Diagnosis not present

## 2021-02-03 DIAGNOSIS — I0981 Rheumatic heart failure: Secondary | ICD-10-CM | POA: Diagnosis not present

## 2021-02-05 DIAGNOSIS — N2581 Secondary hyperparathyroidism of renal origin: Secondary | ICD-10-CM | POA: Diagnosis not present

## 2021-02-05 DIAGNOSIS — Z992 Dependence on renal dialysis: Secondary | ICD-10-CM | POA: Diagnosis not present

## 2021-02-05 DIAGNOSIS — N186 End stage renal disease: Secondary | ICD-10-CM | POA: Diagnosis not present

## 2021-02-07 DIAGNOSIS — N2581 Secondary hyperparathyroidism of renal origin: Secondary | ICD-10-CM | POA: Diagnosis not present

## 2021-02-07 DIAGNOSIS — N186 End stage renal disease: Secondary | ICD-10-CM | POA: Diagnosis not present

## 2021-02-07 DIAGNOSIS — Z992 Dependence on renal dialysis: Secondary | ICD-10-CM | POA: Diagnosis not present

## 2021-02-08 DIAGNOSIS — N186 End stage renal disease: Secondary | ICD-10-CM | POA: Diagnosis not present

## 2021-02-08 DIAGNOSIS — N2581 Secondary hyperparathyroidism of renal origin: Secondary | ICD-10-CM | POA: Diagnosis not present

## 2021-02-08 DIAGNOSIS — Z992 Dependence on renal dialysis: Secondary | ICD-10-CM | POA: Diagnosis not present

## 2021-02-10 DIAGNOSIS — Z992 Dependence on renal dialysis: Secondary | ICD-10-CM | POA: Diagnosis not present

## 2021-02-10 DIAGNOSIS — N2581 Secondary hyperparathyroidism of renal origin: Secondary | ICD-10-CM | POA: Diagnosis not present

## 2021-02-10 DIAGNOSIS — N186 End stage renal disease: Secondary | ICD-10-CM | POA: Diagnosis not present

## 2021-02-12 DIAGNOSIS — N186 End stage renal disease: Secondary | ICD-10-CM | POA: Diagnosis not present

## 2021-02-12 DIAGNOSIS — Z992 Dependence on renal dialysis: Secondary | ICD-10-CM | POA: Diagnosis not present

## 2021-02-12 DIAGNOSIS — N2581 Secondary hyperparathyroidism of renal origin: Secondary | ICD-10-CM | POA: Diagnosis not present

## 2021-02-14 DIAGNOSIS — I871 Compression of vein: Secondary | ICD-10-CM | POA: Diagnosis not present

## 2021-02-14 DIAGNOSIS — Z992 Dependence on renal dialysis: Secondary | ICD-10-CM | POA: Diagnosis not present

## 2021-02-14 DIAGNOSIS — T82868A Thrombosis of vascular prosthetic devices, implants and grafts, initial encounter: Secondary | ICD-10-CM | POA: Diagnosis not present

## 2021-02-14 DIAGNOSIS — N186 End stage renal disease: Secondary | ICD-10-CM | POA: Diagnosis not present

## 2021-02-15 DIAGNOSIS — Z992 Dependence on renal dialysis: Secondary | ICD-10-CM | POA: Diagnosis not present

## 2021-02-15 DIAGNOSIS — N186 End stage renal disease: Secondary | ICD-10-CM | POA: Diagnosis not present

## 2021-02-15 DIAGNOSIS — N2581 Secondary hyperparathyroidism of renal origin: Secondary | ICD-10-CM | POA: Diagnosis not present

## 2021-02-17 DIAGNOSIS — N2581 Secondary hyperparathyroidism of renal origin: Secondary | ICD-10-CM | POA: Diagnosis not present

## 2021-02-17 DIAGNOSIS — Z992 Dependence on renal dialysis: Secondary | ICD-10-CM | POA: Diagnosis not present

## 2021-02-17 DIAGNOSIS — N186 End stage renal disease: Secondary | ICD-10-CM | POA: Diagnosis not present

## 2021-02-17 NOTE — Telephone Encounter (Signed)
Will forward to Kathryne Sharper RN to check on status of Eliquis assistance in the meantime will leave 2 weeks worth of samples of front desk for pt to pick up .Adonis Housekeeper

## 2021-02-17 NOTE — Telephone Encounter (Signed)
Patient following up on Eliquis Assistance that was faxed to [name]. States he only has 2 days left worth of medication.

## 2021-02-18 NOTE — Telephone Encounter (Signed)
Per the patient, there are three in his household. Sherian Maroon has been called and updated.

## 2021-02-18 NOTE — Telephone Encounter (Signed)
Left a message for the patient to call back. The patient left the number of people in the household blank on the form for assistance.

## 2021-02-19 DIAGNOSIS — Z992 Dependence on renal dialysis: Secondary | ICD-10-CM | POA: Diagnosis not present

## 2021-02-19 DIAGNOSIS — N186 End stage renal disease: Secondary | ICD-10-CM | POA: Diagnosis not present

## 2021-02-19 DIAGNOSIS — N2581 Secondary hyperparathyroidism of renal origin: Secondary | ICD-10-CM | POA: Diagnosis not present

## 2021-02-21 DIAGNOSIS — Z992 Dependence on renal dialysis: Secondary | ICD-10-CM | POA: Diagnosis not present

## 2021-02-21 DIAGNOSIS — N186 End stage renal disease: Secondary | ICD-10-CM | POA: Diagnosis not present

## 2021-02-21 DIAGNOSIS — N2581 Secondary hyperparathyroidism of renal origin: Secondary | ICD-10-CM | POA: Diagnosis not present

## 2021-02-22 ENCOUNTER — Encounter: Payer: Self-pay | Admitting: Family

## 2021-02-22 ENCOUNTER — Other Ambulatory Visit: Payer: Self-pay

## 2021-02-22 ENCOUNTER — Ambulatory Visit (INDEPENDENT_AMBULATORY_CARE_PROVIDER_SITE_OTHER): Payer: Medicare HMO | Admitting: Family

## 2021-02-22 VITALS — BP 108/56 | HR 72 | Temp 98.2°F | Resp 16 | Ht 74.0 in | Wt 186.0 lb

## 2021-02-22 DIAGNOSIS — I509 Heart failure, unspecified: Secondary | ICD-10-CM

## 2021-02-22 DIAGNOSIS — N186 End stage renal disease: Secondary | ICD-10-CM

## 2021-02-22 DIAGNOSIS — Z992 Dependence on renal dialysis: Secondary | ICD-10-CM

## 2021-02-22 DIAGNOSIS — I10 Essential (primary) hypertension: Secondary | ICD-10-CM | POA: Diagnosis not present

## 2021-02-22 DIAGNOSIS — B20 Human immunodeficiency virus [HIV] disease: Secondary | ICD-10-CM

## 2021-02-22 DIAGNOSIS — N2581 Secondary hyperparathyroidism of renal origin: Secondary | ICD-10-CM | POA: Diagnosis not present

## 2021-02-22 MED ORDER — OFLOXACIN 0.3 % OP SOLN: 1.0000 [drp] | Freq: Four times a day (QID) | OPHTHALMIC | Status: AC

## 2021-02-22 NOTE — Progress Notes (Signed)
Subjective:    Patient ID: David [name], male    DOB: [birth year], 80 y.o.   MRN: [id number]  HPI   Patient is a 80 yr old male who presents today for follow up.  ESRD- pt does dialysis 4 days a week. Wife states that he is doing much better with fewer ED visits on this schedule.  CHF- he continues to follow with cardiology. Has chronic DOE but this is stable.   Denies gout flare ups.  Continues allopurinol after dialysis.   HIV- treated with biktarvy and well controlled. Review of Systems See HPI  Past Medical History:  Diagnosis Date  . 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 Acute on chronic systolic and diastolic heart failure, NYHA class 4 ([place])   . Anemia, iron deficiency 11/15/2011  . Arthritis    "hands, right knee, feet" (02/21/2018)  . Atrial fibrillation ([place])   . Cardiac arrest ([place]) 02/17/2018  . CHF (congestive heart failure) ([place])   . Chronic combined systolic and diastolic heart failure, NYHA class 3 ([place]) 06/2010   felt to be secondary to hypertensive cardiomyopathy  . Chronic lower back pain   . CKD (chronic kidney disease) stage V requiring chronic dialysis ([place])   . COVID-19 12/2019  . ESRD on dialysis [facility])    started 02/2018 [place]  . GERD (gastroesophageal reflux disease)   . Glaucoma    "I had surgery and dont have it any more"  . Gout    daily RX (02/21/2018)  . Heart murmur    "mild" per pt  . Hepatitis    years ago  . HIV (human immunodeficiency virus infection) ([place])   . HIV infection ([place])   . Hyperlipidemia   . Hypertension   . Nonischemic cardiomyopathy ([place])   . Pneumonia 11/2017  . Prostate cancer ([place])    hx of prostate; s/p radioactive seed implant 10/2009 Dr Janice Norrie  . Sinus bradycardia   . Sleep apnea    does not use a cpap  . Stroke [facility])    "mini stroke" years ago  . Wears glasses      Social History   Socioeconomic History  . Marital status: Married    Spouse name: Not on file  .  Number of children: 2  . Years of education: 41  . Highest education level: Not on file  Occupational History  . 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 Smoking status: Former Smoker    Packs/day: 1.00    Years: 30.00    Pack years: 30.00    Types: Cigarettes    Quit date: 2006    Years since quitting: 16.2  . Smokeless tobacco: Never Used  Vaping Use  . Vaping Use: Never used  Substance and Sexual Activity  . Alcohol use: Yes    Alcohol/week: 1.0 standard drink    Types: 1 Shots of liquor per week    Comment: occasional  . Drug use: Never  . Sexual activity: Not Currently    Partners: Female    Comment: declined condoms 09/2019  Other Topics Concern  . Not on file  Social History Narrative   Retired, married   Hospital doctor   1 son 2 daughters    ESRD M T thurs Sat      Stays active at home   Regular exercise: no   Drinks 2 cups of coffee a week, 1 mountain dew soda  a day.   Social Determinants of Health   Financial Resource Strain: Low Risk   . Difficulty of Paying Living Expenses: Not hard at all  Food Insecurity: No Food Insecurity  . Worried About Charity fundraiser in the Last Year: Never true  . Ran Out of Food in the Last Year: Never true  Transportation Needs: No Transportation Needs  . Lack of Transportation (Medical): No  . Lack of Transportation (Non-Medical): No  Physical Activity: Not on file  Stress: Not on file  Social Connections: Not on file  Intimate Partner Violence: Not on file    Past Surgical History:  Procedure Laterality Date  . A/V FISTULAGRAM Bilateral 07/30/2020   Procedure: CENTRAL VENOGRAM;  Surgeon: Angelia Mould, MD;  Location: [place] [facility];  Service: Cardiovascular;  Laterality: Bilateral;  bilateral upper venogram   . AV FISTULA PLACEMENT Right 10/13/2016   Procedure: ARTERIOVENOUS (AV) FISTULA CREATION;  Surgeon: [name], MD;  Location: [place];   Service: Vascular;  Laterality: Right;  . AV FISTULA PLACEMENT Left 02/25/2018   Procedure: INSERTION OF ARTERIOVENOUS (AV) GORE-TEX GRAFT LEFT UPPER ARM;  Surgeon: Angelia Mould, MD;  Location: [place];  Service: Vascular;  Laterality: Left;  . AV FISTULA PLACEMENT Right 08/07/2018   Procedure: Creation of right arm brachiocephalic Fistula;  Surgeon: Waynetta Sandy, MD;  Location: [place];  Service: Vascular;  Laterality: Right;  . AV FISTULA PLACEMENT Left 11/10/2019   Procedure: INSERTION OF ARTERIOVENOUS (AV) GORE-TEX GRAFT THIGH;  Surgeon: Elam Dutch, MD;  Location: [place];  Service: Vascular;  Laterality: Left;  . AV FISTULA PLACEMENT Right 12/02/2019   Procedure: INSERTION OF ARTERIOVENOUS (AV) GORE-TEX GRAFT RIGHT  THIGH;  Surgeon: Waynetta Sandy, MD;  Location: [place];  Service: Vascular;  Laterality: Right;  . AV FISTULA PLACEMENT Right 08/13/2020   Procedure: INSERTION OF RIGHT ARTERIOVENOUS (AV) GORE-TEX GRAFT ARM;  Surgeon: Angelia Mould, MD;  Location: [place];  Service: Vascular;  Laterality: Right;  . BASCILIC VEIN TRANSPOSITION Left 06/24/2015   Procedure: BASILIC VEIN TRANSPOSITION;  Surgeon: Mal Misty, MD;  Location: [place];  Service: Vascular;  Laterality: Left;  . BIOPSY  03/14/2019   Procedure: BIOPSY;  Surgeon: Irene Shipper, MD;  Location: [place];  Service: Endoscopy;;  . CATARACT EXTRACTION, BILATERAL Bilateral   . COLONOSCOPY WITH PROPOFOL N/A 03/14/2019   Procedure: COLONOSCOPY WITH PROPOFOL;  Surgeon: Irene Shipper, MD;  Location: [facility] ENDOSCOPY;  Service: Endoscopy;  Laterality: N/A;  . ESOPHAGOGASTRODUODENOSCOPY (EGD) WITH PROPOFOL N/A 05/05/2014   Procedure: ESOPHAGOGASTRODUODENOSCOPY (EGD) WITH PROPOFOL;  Surgeon: Missy Sabins, MD;  Location: [facility] ENDOSCOPY;  Service: Endoscopy;  Laterality: N/A;  . EYE SURGERY Bilateral    cataract surgery   . GLAUCOMA SURGERY Bilateral   . INSERTION OF ARTERIOVENOUS (AV) ARTEGRAFT ARM   10/09/2018   Procedure: INSERTION OF ARTERIOVENOUS (AV) 42mm x 41cm ARTEGRAFT LEFT UPPER ARM;  Surgeon: Waynetta Sandy, MD;  Location: [place];  Service: Vascular;;  . INSERTION OF DIALYSIS CATHETER Right 07/14/2019   Procedure: Insertion Of Dialysis Catheter;  Surgeon: Elam Dutch, MD;  Location: [name] OR;  Service: Vascular;  Laterality: Right;  placed right Internal Jugular  . INSERTION OF DIALYSIS CATHETER Left 08/13/2020   Procedure: INSERTION OF DIALYSIS CATHETER;  Surgeon: Angelia Mould, MD;  Location: [place];  Service: Vascular;  Laterality: Left;  . INSERTION PROSTATE RADIATION SEED    . IR FLUORO GUIDE  CV LINE RIGHT  02/18/2018  . IR REMOVAL TUN CV CATH W/O FL  12/06/2018  . IR THROMBECTOMY AV FISTULA W/THROMBOLYSIS INC/SHUNT/IMG RIGHT  11/12/2020  . IR THROMBECTOMY AV FISTULA W/THROMBOLYSIS/PTA INC/SHUNT/IMG RIGHT Right 01/26/2020  . IR THROMBECTOMY AV FISTULA W/THROMBOLYSIS/PTA INC/SHUNT/IMG RIGHT Right 02/24/2020  . IR THROMBECTOMY AV FISTULA W/THROMBOLYSIS/PTA INC/SHUNT/IMG RIGHT Right 04/07/2020  . IR US GUIDE VASC ACCESS RIGHT  02/18/2018  . IR US GUIDE VASC ACCESS RIGHT  01/26/2020  . IR US GUIDE VASC ACCESS RIGHT  04/07/2020  . IR US GUIDE VASC ACCESS RIGHT  11/12/2020  . LEFT HEART CATH AND CORONARY ANGIOGRAPHY N/A 02/20/2018   Procedure: LEFT HEART CATH AND CORONARY ANGIOGRAPHY;  Surgeon: Jettie Booze, MD;  Location: [place] [facility];;; 50% ostial OM2, 25% mLAD.  Marland Kitchen LEFT HEART CATH AND CORONARY ANGIOGRAPHY  2004   mildly depressed LV systolic fx EF 40%,NUUVOZ coronaries/abdominal aorta/renal arteries.  . LOWER EXTREMITY ANGIOGRAM Left 11/17/2019   Procedure: Lower Extremity Fistulogram;  Surgeon: Marty Heck, MD;  Location: [place];  Service: Vascular;  Laterality: Left;  . NM [place]  02/21/2010   No ischemia or infarction.  EF 27%.  Marland Kitchen RADIOACTIVE SEED IMPLANT  2010   prostate cancer  . REMOVAL OF A DIALYSIS CATHETER Right 08/13/2020    Procedure: REMOVAL OF A DIALYSIS CATHETER;  Surgeon: Angelia Mould, MD;  Location: [facility] OR;  Service: Vascular;  Laterality: Right;  . REVISION OF ARTERIOVENOUS GORETEX GRAFT Right 07/11/2019   Procedure: REVISION OF ARTERIOVENOUS GORETEX GRAFT RIGHT ARM WITH ARTEGRAFT;  Surgeon: Serafina Mitchell, MD;  Location: [place];  Service: Vascular;  Laterality: Right;  . SHUNTOGRAM Right 07/14/2019   Procedure: Shuntogram;  Surgeon: Elam Dutch, MD;  Location: [place];  Service: Vascular;  Laterality: Right;  . THROMBECTOMY AND REVISION OF ARTERIOVENTOUS (AV) GORETEX  GRAFT Right 07/14/2019   Procedure: THROMBECTOMY AND REVISION OF ARTERIOVENTOUS (AV) GORETEX  GRAFT;  Surgeon: Elam Dutch, MD;  Location: [place];  Service: Vascular;  Laterality: Right;  . THROMBECTOMY W/ EMBOLECTOMY Left 02/26/2018   Procedure: THROMBECTOMY ARTERIOVENOUS GRAFT;  Surgeon: Angelia Mould, MD;  Location: [place];  Service: Vascular;  Laterality: Left;  . THROMBECTOMY W/ EMBOLECTOMY Left 11/17/2019   Procedure: THROMBECTOMY ARTERIOVENOUS GRAFT LEFT THIGH;  Surgeon: Marty Heck, MD;  Location: [place];  Service: Vascular;  Laterality: Left;  . TRANSTHORACIC ECHOCARDIOGRAM  02/2012   [facility]) EF 45-50% with mild global HK.  Mild LVH,LA mod. dilated,mild-mod. 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 AI.  Marland Kitchen TRANSTHORACIC ECHOCARDIOGRAM  9/'18; 3/'19   a) Severely reduced EF.  25 from 30%.  GR 1 DD with diffuse ecchymosis. Mild AS & AI.  Mild LA/RA dilation; b) (post PEA arest):   Moderately dilated LV with moderate concentric virtually.  EF 15 to 20%.  GR 1 DD.  Paradoxical septal motion. Mild AI.  Mild LA dilation.  Poorly visualized RV.  Marland Kitchen TRANSTHORACIC ECHOCARDIOGRAM  02/2019   EF 20-25%.  Unable to assess diastolic function (A. Fib).  No LV apical thrombus.  Mild AI.  Mildly reduced RV function, however poorly visualized.  Marland Kitchen ULTRASOUND GUIDANCE FOR VASCULAR ACCESS  02/20/2018    Procedure: Ultrasound Guidance For Vascular Access;  Surgeon: Jettie Booze, MD;  Location: [place] [facility];  Service: Cardiovascular;;  . UPPER EXTREMITY VENOGRAPHY N/A 07/08/2018   Procedure: UPPER EXTREMITY VENOGRAPHY;  Surgeon: Waynetta Sandy, MD;  Location:  [place] INVASIVE CV LAB;  Service: Cardiovascular;  Laterality: N/A;  Bilateral    Family History  Problem Relation Age of Onset  . Hypertension Mother   . Thyroid disease Mother   . Cholelithiasis Daughter   . Cholelithiasis Son   . Hypertension Maternal Grandmother   . Diabetes Maternal Grandmother   . Heart attack Neg Hx   . Hyperlipidemia Neg Hx     Allergies  Allergen Reactions  . Dextromethorphan-Guaifenesin Other (See Comments)    Unknown reaction  . Tocotrienols Other (See Comments)    Unknown reaction  . Losartan Potassium Other (See Comments)    Causes constipation    Current Outpatient Medications on File Prior to Visit  Medication Sig Dispense Refill  . acetaminophen (TYLENOL) 500 MG tablet Take 1,000 mg by mouth every 6 (six) hours as needed for moderate pain.    Marland Kitchen allopurinol (ZYLOPRIM) 100 MG tablet TAKE 1 TABLET BY MOUTH 4 TIMES WEEKLY( MONDAY, TUESDAY,THURDSAY AND SATURDAY) AFTER DIALYSIS (Patient taking differently: Take 100 mg by mouth See admin instructions. TAKE 1 TABLET BY MOUTH 4 TIMES WEEKLY( MONDAY, TUESDAY,THURDSAY AND SATURDAY) AFTER DIALYSIS) 45 tablet 5  . amiodarone (PACERONE) 200 MG tablet TAKE 1 TABLET(200 MG) BY MOUTH DAILY (Patient taking differently: Take 200 mg by mouth daily.) 90 tablet 3  . apixaban (ELIQUIS) 5 MG TABS tablet TAKE 1 TABLET(5 MG) BY MOUTH TWICE DAILY 180 tablet 3  . [name] 50-200-25 MG TABS tablet TAKE 1 TABLET BY MOUTH DAILY 30 tablet 5  . brimonidine (ALPHAGAN P) 0.1 % SOLN Place 1 drop into the right eye 2 (two) times daily.    . dorzolamide-timolol (COSOPT) 22.3-6.8 MG/ML ophthalmic solution Place 1 drop into the right eye 2 (two) times daily.    .  heparin 1000 unit/mL SOLN injection Heparin Sodium (Porcine) 1,000 Units/mL Catheter Lock Arterial    . lidocaine-prilocaine (EMLA) cream Apply 1 application topically See admin instructions. Use on dialysis days    . midodrine (PROAMATINE) 10 MG tablet Take 1 tablet (10 mg total) by mouth as directed. Twice a day on the day of HD Tuesday, Thursday, Saturday (1 in AM and 1 at [place]). Take 1/2 tablet (5mg ) a day on non HD days (Patient taking differently: Take 10-20 mg by mouth See admin instructions. Take 10 mg in the morning at 5 AM. take 10 mg at 6AM of dialysis days and 10 mg half way through treatment on dialysis days (Mon, Tues, Thurs, and Sat)) 90 tablet 5  . multivitamin (RENA-VIT) TABS tablet Take 1 tablet by mouth every evening.    . pantoprazole (PROTONIX) 40 MG tablet Take 1 tablet (40 mg total) by mouth daily. 30 tablet 3  . sucroferric oxyhydroxide (VELPHORO) 500 MG chewable tablet Chew 500 mg by mouth 3 (three) times daily with meals.    . montelukast (SINGULAIR) 10 MG tablet Take 1 tablet (10 mg total) by mouth at bedtime. 30 tablet 5   No current facility-administered medications on file prior to visit.    BP (!) 108/56 (BP Location: Left Arm, Patient Position: Sitting, Cuff Size: Large)   Pulse 72   Temp 98.2 F (36.8 C) (Oral)   Resp 16   Ht 6\' 2"  (1.88 m)   Wt 186 lb (84.4 kg)   SpO2 98%   BMI 23.88 kg/m       Objective:   Physical Exam Constitutional:      General: He is not in acute distress.    Appearance: He is  well-developed.  HENT:     Head: Normocephalic and atraumatic.  Cardiovascular:     Rate and Rhythm: Normal rate and regular rhythm.     Heart sounds: No murmur heard.   Pulmonary:     Effort: Pulmonary effort is normal. No respiratory distress.     Breath sounds: Normal breath sounds. No wheezing or rales.  Skin:    General: Skin is warm and dry.  Neurological:     Mental Status: He is alert and oriented to person, place, and time.   Psychiatric:        Behavior: Behavior normal.        Thought Content: Thought content normal.           Assessment & Plan:  CKD- stable on 4x week HD>  CHF- currently stable, management per cardiology.  HIV- undetectable on [name], management per ID>    HTN- bp is stable. He is not on antihypertensives. Monitor.  BP Readings from Last 3 Encounters:  02/22/21 (!) 108/56  01/27/21 (!) 158/74  01/11/21 112/64   This visit occurred during the SARS-CoV-2 public health emergency.  Safety protocols were in place, including screening questions prior to the visit, additional usage of staff PPE, and extensive cleaning of exam room while observing appropriate contact time as indicated for disinfecting solutions.

## 2021-02-24 DIAGNOSIS — N2581 Secondary hyperparathyroidism of renal origin: Secondary | ICD-10-CM | POA: Diagnosis not present

## 2021-02-24 DIAGNOSIS — Z992 Dependence on renal dialysis: Secondary | ICD-10-CM | POA: Diagnosis not present

## 2021-02-24 DIAGNOSIS — N186 End stage renal disease: Secondary | ICD-10-CM | POA: Diagnosis not present

## 2021-02-25 DIAGNOSIS — T82858A Stenosis of vascular prosthetic devices, implants and grafts, initial encounter: Secondary | ICD-10-CM | POA: Diagnosis not present

## 2021-02-25 DIAGNOSIS — N186 End stage renal disease: Secondary | ICD-10-CM | POA: Diagnosis not present

## 2021-02-25 DIAGNOSIS — Z992 Dependence on renal dialysis: Secondary | ICD-10-CM | POA: Diagnosis not present

## 2021-02-25 DIAGNOSIS — I771 Stricture of artery: Secondary | ICD-10-CM | POA: Diagnosis not present

## 2021-02-26 DIAGNOSIS — N2581 Secondary hyperparathyroidism of renal origin: Secondary | ICD-10-CM | POA: Diagnosis not present

## 2021-02-26 DIAGNOSIS — N186 End stage renal disease: Secondary | ICD-10-CM | POA: Diagnosis not present

## 2021-02-26 DIAGNOSIS — Z992 Dependence on renal dialysis: Secondary | ICD-10-CM | POA: Diagnosis not present

## 2021-02-28 DIAGNOSIS — Z992 Dependence on renal dialysis: Secondary | ICD-10-CM | POA: Diagnosis not present

## 2021-02-28 DIAGNOSIS — N2581 Secondary hyperparathyroidism of renal origin: Secondary | ICD-10-CM | POA: Diagnosis not present

## 2021-02-28 DIAGNOSIS — N186 End stage renal disease: Secondary | ICD-10-CM | POA: Diagnosis not present

## 2021-03-01 DIAGNOSIS — N2581 Secondary hyperparathyroidism of renal origin: Secondary | ICD-10-CM | POA: Diagnosis not present

## 2021-03-01 DIAGNOSIS — N186 End stage renal disease: Secondary | ICD-10-CM | POA: Diagnosis not present

## 2021-03-01 DIAGNOSIS — Z992 Dependence on renal dialysis: Secondary | ICD-10-CM | POA: Diagnosis not present

## 2021-03-02 ENCOUNTER — Telehealth: Payer: Self-pay | Admitting: Cardiovascular Disease

## 2021-03-02 NOTE — Telephone Encounter (Addendum)
Received a notice that the patient's assistance has been denied. He needs to show proof that he has spent 3% out of pocket prescription expenses and they may reconsider.  Patient has been made aware and will bring in the appropriate information. He has been advised to call back if he needs help with this.

## 2021-03-02 NOTE — Telephone Encounter (Signed)
Eliquis 5 mg #2 Lot # [id number] EXP 7/24 Confirmed Elquis 5 mg, advised patient at front for pick up

## 2021-03-02 NOTE — Telephone Encounter (Signed)
Patient calling the office for samples of medication:   1.  What medication and dosage are you requesting samples for? Eliquis  2.  Are you currently out of this medication? Yes, Pt is still waiting for medication assistance Pt would like to know if he can get some samples Please Advise

## 2021-03-03 DIAGNOSIS — B20 Human immunodeficiency virus [HIV] disease: Secondary | ICD-10-CM | POA: Diagnosis not present

## 2021-03-03 DIAGNOSIS — N186 End stage renal disease: Secondary | ICD-10-CM | POA: Diagnosis not present

## 2021-03-03 DIAGNOSIS — N2581 Secondary hyperparathyroidism of renal origin: Secondary | ICD-10-CM | POA: Diagnosis not present

## 2021-03-03 DIAGNOSIS — Z992 Dependence on renal dialysis: Secondary | ICD-10-CM | POA: Diagnosis not present

## 2021-03-05 DIAGNOSIS — Z992 Dependence on renal dialysis: Secondary | ICD-10-CM | POA: Diagnosis not present

## 2021-03-05 DIAGNOSIS — N186 End stage renal disease: Secondary | ICD-10-CM | POA: Diagnosis not present

## 2021-03-05 DIAGNOSIS — N2581 Secondary hyperparathyroidism of renal origin: Secondary | ICD-10-CM | POA: Diagnosis not present

## 2021-03-07 DIAGNOSIS — Z992 Dependence on renal dialysis: Secondary | ICD-10-CM | POA: Diagnosis not present

## 2021-03-07 DIAGNOSIS — N2581 Secondary hyperparathyroidism of renal origin: Secondary | ICD-10-CM | POA: Diagnosis not present

## 2021-03-07 DIAGNOSIS — N186 End stage renal disease: Secondary | ICD-10-CM | POA: Diagnosis not present

## 2021-03-08 DIAGNOSIS — Z992 Dependence on renal dialysis: Secondary | ICD-10-CM | POA: Diagnosis not present

## 2021-03-08 DIAGNOSIS — N2581 Secondary hyperparathyroidism of renal origin: Secondary | ICD-10-CM | POA: Diagnosis not present

## 2021-03-08 DIAGNOSIS — N186 End stage renal disease: Secondary | ICD-10-CM | POA: Diagnosis not present

## 2021-03-10 ENCOUNTER — Telehealth: Payer: Self-pay | Admitting: Cardiovascular Disease

## 2021-03-10 DIAGNOSIS — Z992 Dependence on renal dialysis: Secondary | ICD-10-CM | POA: Diagnosis not present

## 2021-03-10 DIAGNOSIS — N186 End stage renal disease: Secondary | ICD-10-CM | POA: Diagnosis not present

## 2021-03-10 DIAGNOSIS — N2581 Secondary hyperparathyroidism of renal origin: Secondary | ICD-10-CM | POA: Diagnosis not present

## 2021-03-10 NOTE — Telephone Encounter (Signed)
   Patient Name: David [name]  DOB: 12-Oct-1941  MRN: [id number]   Primary Cardiologist: [name], MD  Chart reviewed as part of pre-operative protocol coverage. Given past medical history and time since last visit, based on ACC/AHA guidelines, [facility] would be at acceptable risk for the planned procedure without further cardiovascular testing. Patient is cleared to proceed with Xen procedure.   The patient was advised that if he develops new symptoms prior to surgery to contact our office to arrange for a follow-up visit, and he verbalized understanding.  I will route this recommendation to the requesting party via Epic fax function and remove from pre-op pool.  Please call with questions.  [place], [place] 03/10/2021, 10:48 AM

## 2021-03-10 NOTE — Telephone Encounter (Signed)
       [facility] Pre-operative Risk Assessment    Patient Name: David [name]  DOB: Apr 12, 1941  MRN: [id number]   HEARTCARE STAFF: - Please ensure there is not already an duplicate clearance open for this procedure. - Under Visit Info/Reason for Call, type in Other and utilize the format Clearance MM/DD/YY or Clearance TBD. Do not use dashes or single digits. - If request is for dental extraction, please clarify the # of teeth to be extracted.  Request for surgical clearance:  1. What type of surgery is being performed? ZEN   2. When is this surgery scheduled? 03/10/21  3. What type of clearance is required (medical clearance vs. Pharmacy clearance to hold med vs. Both)? Medical  4. Are there any medications that need to be held prior to surgery and how long?   5. Practice name and name of physician performing surgery? Dr. Midge Aver  6. What is the office phone number? [phone] ext [phone]   7.   What is the office fax number? [phone] ATTN: Hollie  8.   Anesthesia type (None, local, MAC, general) ? Block  Hollie with [facility], they already got clearance for eliquis, they just need to get clearance that pt is ok to get the procedure today.   Angeline S Hammer 03/10/2021, 8:27 AM  _________________________________________________________________   (provider comments below)

## 2021-03-12 DIAGNOSIS — Z992 Dependence on renal dialysis: Secondary | ICD-10-CM | POA: Diagnosis not present

## 2021-03-12 DIAGNOSIS — N186 End stage renal disease: Secondary | ICD-10-CM | POA: Diagnosis not present

## 2021-03-12 DIAGNOSIS — N2581 Secondary hyperparathyroidism of renal origin: Secondary | ICD-10-CM | POA: Diagnosis not present

## 2021-03-14 DIAGNOSIS — N2581 Secondary hyperparathyroidism of renal origin: Secondary | ICD-10-CM | POA: Diagnosis not present

## 2021-03-14 DIAGNOSIS — N186 End stage renal disease: Secondary | ICD-10-CM | POA: Diagnosis not present

## 2021-03-14 DIAGNOSIS — Z992 Dependence on renal dialysis: Secondary | ICD-10-CM | POA: Diagnosis not present

## 2021-03-15 ENCOUNTER — Telehealth: Payer: Self-pay

## 2021-03-15 DIAGNOSIS — N186 End stage renal disease: Secondary | ICD-10-CM | POA: Diagnosis not present

## 2021-03-15 DIAGNOSIS — Z992 Dependence on renal dialysis: Secondary | ICD-10-CM | POA: Diagnosis not present

## 2021-03-15 DIAGNOSIS — N2581 Secondary hyperparathyroidism of renal origin: Secondary | ICD-10-CM | POA: Diagnosis not present

## 2021-03-15 NOTE — Telephone Encounter (Signed)
Patient's daughter brought in necessary papers. This has been faxed to [facility].

## 2021-03-15 NOTE — Telephone Encounter (Signed)
Pt's daughter came in as a walk in to bring pt assistance forms and would like additional samples until paperwork can be approved.  Medication samples have been provided to the patient.  Drug name: Eliquis 5mg    Qty: 3 boxes   LOT: [id number]   Exp.Date: 06/2023  Samples given to daughter in waiting room.

## 2021-03-17 DIAGNOSIS — N186 End stage renal disease: Secondary | ICD-10-CM | POA: Diagnosis not present

## 2021-03-17 DIAGNOSIS — N2581 Secondary hyperparathyroidism of renal origin: Secondary | ICD-10-CM | POA: Diagnosis not present

## 2021-03-17 DIAGNOSIS — Z992 Dependence on renal dialysis: Secondary | ICD-10-CM | POA: Diagnosis not present

## 2021-03-19 DIAGNOSIS — N186 End stage renal disease: Secondary | ICD-10-CM | POA: Diagnosis not present

## 2021-03-19 DIAGNOSIS — N2581 Secondary hyperparathyroidism of renal origin: Secondary | ICD-10-CM | POA: Diagnosis not present

## 2021-03-19 DIAGNOSIS — Z992 Dependence on renal dialysis: Secondary | ICD-10-CM | POA: Diagnosis not present

## 2021-03-21 ENCOUNTER — Other Ambulatory Visit: Payer: Self-pay

## 2021-03-21 DIAGNOSIS — Z992 Dependence on renal dialysis: Secondary | ICD-10-CM | POA: Diagnosis not present

## 2021-03-21 DIAGNOSIS — N2581 Secondary hyperparathyroidism of renal origin: Secondary | ICD-10-CM | POA: Diagnosis not present

## 2021-03-21 DIAGNOSIS — N186 End stage renal disease: Secondary | ICD-10-CM | POA: Diagnosis not present

## 2021-03-21 MED ORDER — ELIQUIS 5 MG PO TABS: ORAL_TABLET | ORAL | 1 refills | Status: DC

## 2021-03-22 ENCOUNTER — Telehealth: Payer: Self-pay | Admitting: *Deleted

## 2021-03-22 DIAGNOSIS — Z992 Dependence on renal dialysis: Secondary | ICD-10-CM | POA: Diagnosis not present

## 2021-03-22 DIAGNOSIS — N186 End stage renal disease: Secondary | ICD-10-CM | POA: Diagnosis not present

## 2021-03-22 DIAGNOSIS — N2581 Secondary hyperparathyroidism of renal origin: Secondary | ICD-10-CM | POA: Diagnosis not present

## 2021-03-22 NOTE — Telephone Encounter (Signed)
Eliquis assistance has been approved from 03/16/21-12/03/2021

## 2021-03-23 ENCOUNTER — Other Ambulatory Visit: Payer: Self-pay

## 2021-03-23 MED ORDER — ELIQUIS 5 MG PO TABS: ORAL_TABLET | ORAL | 1 refills | Status: DC

## 2021-03-23 NOTE — Telephone Encounter (Signed)
Ok to refill Eliquis, pt not on systemic heparin, just to clear cath.

## 2021-03-23 NOTE — Telephone Encounter (Signed)
65m, 84.4kg, scr 6.02 12/01/20, lovw/croitoru 01/11/21 will route to pharmd pool for a flag on the refill.  Medium  Duplicate Therapy: heparin sodium (porcine), Eliquis, heparin HEPARINS AND RELATED. No Abuse/Dependency Potential.  Details   apixaban (ELIQUIS) 5 MG TABS tablet Prescription. Reordered. Long-term.   heparin 1000 unit/mL SOLN injection Patient reported medication. Active.

## 2021-03-24 DIAGNOSIS — N2581 Secondary hyperparathyroidism of renal origin: Secondary | ICD-10-CM | POA: Diagnosis not present

## 2021-03-24 DIAGNOSIS — Z992 Dependence on renal dialysis: Secondary | ICD-10-CM | POA: Diagnosis not present

## 2021-03-24 DIAGNOSIS — N186 End stage renal disease: Secondary | ICD-10-CM | POA: Diagnosis not present

## 2021-03-26 DIAGNOSIS — N186 End stage renal disease: Secondary | ICD-10-CM | POA: Diagnosis not present

## 2021-03-26 DIAGNOSIS — Z992 Dependence on renal dialysis: Secondary | ICD-10-CM | POA: Diagnosis not present

## 2021-03-26 DIAGNOSIS — N2581 Secondary hyperparathyroidism of renal origin: Secondary | ICD-10-CM | POA: Diagnosis not present

## 2021-03-28 DIAGNOSIS — N186 End stage renal disease: Secondary | ICD-10-CM | POA: Diagnosis not present

## 2021-03-28 DIAGNOSIS — Z992 Dependence on renal dialysis: Secondary | ICD-10-CM | POA: Diagnosis not present

## 2021-03-28 DIAGNOSIS — N2581 Secondary hyperparathyroidism of renal origin: Secondary | ICD-10-CM | POA: Diagnosis not present

## 2021-03-29 ENCOUNTER — Other Ambulatory Visit: Payer: Self-pay

## 2021-03-29 DIAGNOSIS — N2581 Secondary hyperparathyroidism of renal origin: Secondary | ICD-10-CM | POA: Diagnosis not present

## 2021-03-29 DIAGNOSIS — Z992 Dependence on renal dialysis: Secondary | ICD-10-CM | POA: Diagnosis not present

## 2021-03-29 DIAGNOSIS — N186 End stage renal disease: Secondary | ICD-10-CM | POA: Diagnosis not present

## 2021-03-29 MED ORDER — ELIQUIS 5 MG PO TABS: ORAL_TABLET | ORAL | 1 refills | Status: AC

## 2021-03-31 DIAGNOSIS — N186 End stage renal disease: Secondary | ICD-10-CM | POA: Diagnosis not present

## 2021-03-31 DIAGNOSIS — N2581 Secondary hyperparathyroidism of renal origin: Secondary | ICD-10-CM | POA: Diagnosis not present

## 2021-03-31 DIAGNOSIS — Z992 Dependence on renal dialysis: Secondary | ICD-10-CM | POA: Diagnosis not present

## 2021-04-02 DIAGNOSIS — N2581 Secondary hyperparathyroidism of renal origin: Secondary | ICD-10-CM | POA: Diagnosis not present

## 2021-04-02 DIAGNOSIS — B20 Human immunodeficiency virus [HIV] disease: Secondary | ICD-10-CM | POA: Diagnosis not present

## 2021-04-02 DIAGNOSIS — Z992 Dependence on renal dialysis: Secondary | ICD-10-CM | POA: Diagnosis not present

## 2021-04-02 DIAGNOSIS — N186 End stage renal disease: Secondary | ICD-10-CM | POA: Diagnosis not present

## 2021-04-04 DIAGNOSIS — Z992 Dependence on renal dialysis: Secondary | ICD-10-CM | POA: Diagnosis not present

## 2021-04-04 DIAGNOSIS — N2581 Secondary hyperparathyroidism of renal origin: Secondary | ICD-10-CM | POA: Diagnosis not present

## 2021-04-04 DIAGNOSIS — N186 End stage renal disease: Secondary | ICD-10-CM | POA: Diagnosis not present

## 2021-04-05 DIAGNOSIS — Z992 Dependence on renal dialysis: Secondary | ICD-10-CM | POA: Diagnosis not present

## 2021-04-05 DIAGNOSIS — N186 End stage renal disease: Secondary | ICD-10-CM | POA: Diagnosis not present

## 2021-04-05 DIAGNOSIS — N2581 Secondary hyperparathyroidism of renal origin: Secondary | ICD-10-CM | POA: Diagnosis not present

## 2021-04-06 ENCOUNTER — Encounter: Payer: Self-pay | Admitting: Family Medicine

## 2021-04-06 ENCOUNTER — Other Ambulatory Visit: Payer: Self-pay

## 2021-04-06 ENCOUNTER — Other Ambulatory Visit: Payer: Self-pay | Admitting: Infectious Disease

## 2021-04-06 ENCOUNTER — Ambulatory Visit (INDEPENDENT_AMBULATORY_CARE_PROVIDER_SITE_OTHER): Payer: Medicare HMO | Admitting: Family Medicine

## 2021-04-06 VITALS — BP 130/76 | HR 81 | Temp 98.5°F | Ht 74.0 in | Wt 186.0 lb

## 2021-04-06 DIAGNOSIS — R339 Retention of urine, unspecified: Secondary | ICD-10-CM | POA: Diagnosis not present

## 2021-04-06 DIAGNOSIS — N451 Epididymitis: Secondary | ICD-10-CM | POA: Diagnosis not present

## 2021-04-06 MED ORDER — LEVOFLOXACIN 500 MG PO TABS: 500.0000 mg | ORAL_TABLET | Freq: Every day | ORAL | 0 refills | Status: AC

## 2021-04-06 NOTE — Patient Instructions (Signed)
Stay hydrated.  OK to take Tylenol 1000 mg (2 extra strength tabs) or 975 mg (3 regular strength tabs) every 6 hours as needed.  Ice/cold pack over area for 10-15 min twice daily.  Let us know if you need anything.

## 2021-04-06 NOTE — Addendum Note (Signed)
Addended by: Sharon Seller B on: 04/06/2021 01:05 PM   Modules accepted: Orders

## 2021-04-06 NOTE — Progress Notes (Signed)
Chief Complaint  Patient presents with  . Groin Pain    [facility] Coleman is a 80 y.o. male here for possible UTI. Here w spouse who helps w hx..   Duration: 2 days. Symptoms: testicular pain, hesitancy, hematuria and urinary incontinence Denies: urinary frequency, urinary hesitancy, urinary retention, fever, nausea, vomiting and flank pain, discharge Hx of recurrent UTI? No He is circumcised.   Groin pain 2 d ago was straining getting up while at HD and felt a pain in R groin region. No inj. Sharp pain. Also having urinary issues as noted above. No neuro s/s's. He is not having any bruising, redness, or swelling. No bulging.   Past Medical History:  Diagnosis Date  . 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 Acute on chronic systolic and diastolic heart failure, NYHA class 4 ([place])   . Anemia, iron deficiency 11/15/2011  . Arthritis    "hands, right knee, feet" (02/21/2018)  . Atrial fibrillation ([place])   . Cardiac arrest ([place]) 02/17/2018  . CHF (congestive heart failure) ([place])   . Chronic combined systolic and diastolic heart failure, NYHA class 3 ([place]) 06/2010   felt to be secondary to hypertensive cardiomyopathy  . Chronic lower back pain   . CKD (chronic kidney disease) stage V requiring chronic dialysis ([place])   . COVID-19 12/2019  . ESRD on dialysis [facility])    started 02/2018 [place]  . GERD (gastroesophageal reflux disease)   . Glaucoma    "I had surgery and dont have it any more"  . Gout    daily RX (02/21/2018)  . Heart murmur    "mild" per pt  . Hepatitis    years ago  . HIV (human immunodeficiency virus infection) ([place])   . HIV infection ([place])   . Hyperlipidemia   . Hypertension   . Nonischemic cardiomyopathy ([place])   . Pneumonia 11/2017  . Prostate cancer ([place])    hx of prostate; s/p radioactive seed implant 10/2009 Dr Janice Norrie  . Sinus bradycardia   . Sleep apnea    does not use a cpap  . Stroke [facility])    "mini stroke" years ago  .  Wears glasses      BP 130/76 (BP Location: Left Arm, Patient Position: Sitting, Cuff Size: Normal)   Pulse 81   Temp 98.5 F (36.9 C) (Oral)   Ht 6\' 2"  (1.88 m)   Wt 186 lb (84.4 kg)   SpO2 97%   BMI 23.88 kg/m  General: Awake, alert, appears stated age Heart: RRR Lungs: CTAB, normal respiratory effort, no accessory muscle usage Abd: BS+, soft, NT, ND, no masses or organomegaly GU: R testicle ttp, mild edema, no erythema, fluctuance MSK: No CVA tenderness, neg Lloyd's sign; no bulge or ttp in R inguinal region Psych: limited judgment and insight  Urinary retention - Plan: levofloxacin (LEVAQUIN) 500 MG tablet  Epididymitis - Plan: levofloxacin (LEVAQUIN) 500 MG tablet  1 probably led to 2. 10 d course of Levaquin. Would ideally not use Bactrim given renal function and age.  Stay hydrated. Seek immediate care if pt starts to develop fevers, new/worsening symptoms, uncontrollable N/V. F/u prn. The patient and his spouse voiced understanding and agreement to the plan.  [place], DO 04/06/21 1:02 PM

## 2021-04-07 ENCOUNTER — Other Ambulatory Visit: Payer: Medicare HMO

## 2021-04-07 ENCOUNTER — Telehealth: Payer: Self-pay | Admitting: *Deleted

## 2021-04-07 DIAGNOSIS — R339 Retention of urine, unspecified: Secondary | ICD-10-CM | POA: Diagnosis not present

## 2021-04-07 DIAGNOSIS — Z992 Dependence on renal dialysis: Secondary | ICD-10-CM | POA: Diagnosis not present

## 2021-04-07 DIAGNOSIS — N2581 Secondary hyperparathyroidism of renal origin: Secondary | ICD-10-CM | POA: Diagnosis not present

## 2021-04-07 DIAGNOSIS — N186 End stage renal disease: Secondary | ICD-10-CM | POA: Diagnosis not present

## 2021-04-07 NOTE — Addendum Note (Signed)
Addended by: Kelle Darting A on: 04/07/2021 10:52 AM   Modules accepted: Orders

## 2021-04-07 NOTE — Addendum Note (Signed)
Addended by: Kelle Darting A on: 04/07/2021 11:09 AM   Modules accepted: Orders

## 2021-04-07 NOTE — Telephone Encounter (Signed)
Pt's wife brought in a urine sample for the patient.  There was only enough to do the culture. I cancelled the urinalysis.  His urine was cloudy and red.

## 2021-04-08 LAB — URINE CULTURE
MICRO NUMBER:: 11854423
SPECIMEN QUALITY:: ADEQUATE

## 2021-04-09 DIAGNOSIS — N2581 Secondary hyperparathyroidism of renal origin: Secondary | ICD-10-CM | POA: Diagnosis not present

## 2021-04-09 DIAGNOSIS — N186 End stage renal disease: Secondary | ICD-10-CM | POA: Diagnosis not present

## 2021-04-09 DIAGNOSIS — Z992 Dependence on renal dialysis: Secondary | ICD-10-CM | POA: Diagnosis not present

## 2021-04-11 DIAGNOSIS — N2581 Secondary hyperparathyroidism of renal origin: Secondary | ICD-10-CM | POA: Diagnosis not present

## 2021-04-11 DIAGNOSIS — Z992 Dependence on renal dialysis: Secondary | ICD-10-CM | POA: Diagnosis not present

## 2021-04-11 DIAGNOSIS — N186 End stage renal disease: Secondary | ICD-10-CM | POA: Diagnosis not present

## 2021-04-12 ENCOUNTER — Ambulatory Visit (INDEPENDENT_AMBULATORY_CARE_PROVIDER_SITE_OTHER): Payer: Medicare HMO | Admitting: Family

## 2021-04-12 ENCOUNTER — Encounter: Payer: Self-pay | Admitting: Family

## 2021-04-12 ENCOUNTER — Other Ambulatory Visit: Payer: Self-pay

## 2021-04-12 VITALS — BP 121/64 | HR 77 | Temp 98.4°F | Resp 16

## 2021-04-12 DIAGNOSIS — Z992 Dependence on renal dialysis: Secondary | ICD-10-CM

## 2021-04-12 DIAGNOSIS — H401110 Primary open-angle glaucoma, right eye, stage unspecified: Secondary | ICD-10-CM

## 2021-04-12 DIAGNOSIS — R269 Unspecified abnormalities of gait and mobility: Secondary | ICD-10-CM | POA: Diagnosis not present

## 2021-04-12 DIAGNOSIS — N186 End stage renal disease: Secondary | ICD-10-CM

## 2021-04-12 DIAGNOSIS — M1612 Unilateral primary osteoarthritis, left hip: Secondary | ICD-10-CM | POA: Diagnosis not present

## 2021-04-12 DIAGNOSIS — M109 Gout, unspecified: Secondary | ICD-10-CM

## 2021-04-12 DIAGNOSIS — N2581 Secondary hyperparathyroidism of renal origin: Secondary | ICD-10-CM | POA: Diagnosis not present

## 2021-04-12 NOTE — Assessment & Plan Note (Signed)
Will request home health PT for gait training/strength training.

## 2021-04-12 NOTE — Assessment & Plan Note (Signed)
He has an appointment scheduled at [facility] for consultation.

## 2021-04-12 NOTE — Assessment & Plan Note (Signed)
Has severe pain. Not a good surgical candidate. Will refer to ortho to discuss possibility of joint injection.

## 2021-04-12 NOTE — Patient Instructions (Signed)
You should be contacted about scheduling your appointment with orthopedics and with home home physical therapy.

## 2021-04-12 NOTE — Assessment & Plan Note (Signed)
Stable on allopurinol.  Continue same.  

## 2021-04-12 NOTE — Assessment & Plan Note (Signed)
He has been stable from a volume standpoint on 4x week hemodialysis.

## 2021-04-12 NOTE — Progress Notes (Signed)
Subjective:   By signing my name below, I, David [name], attest that this documentation has been prepared under the direction and in the presence of [name] NP. 04/12/2021     Patient ID: David [name], male    DOB: 1941-07-14, 80 y.o.   MRN: [id number]  No chief complaint on file.   HPI Patient is in today for a office visit. His testicular pain and urinating issues have improved since he started taking antibiotics from his last visit.  He is also complaining of left hip pain. He fell and injured his hips 4-5 months ago and is still in pain. 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 He feels popping in his hip when getting in and out of his wheel chair. He notes that his mobility has declined in the past few months.  He takes longer to get off his wheel chair recently. He uses a walker but it takes much longer to move with it after his hip issues. He is interested in getting an electric wheel chair to improve his mobility. He is doing dialysis 4 days a week.  Gout- He has not had any episodes of gout recently. Allergies- He is taking 10 mg Singulair to manage his allergies and finds relief.   Past Medical History:  Diagnosis Date  . 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 Acute on chronic systolic and diastolic heart failure, NYHA class 4 ([place])   . Anemia, iron deficiency 11/15/2011  . Arthritis    "hands, right knee, feet" (02/21/2018)  . Atrial fibrillation ([place])   . Cardiac arrest ([place]) 02/17/2018  . CHF (congestive heart failure) ([place])   . Chronic combined systolic and diastolic heart failure, NYHA class 3 ([place]) 06/2010   felt to be secondary to hypertensive cardiomyopathy  . Chronic lower back pain   . CKD (chronic kidney disease) stage V requiring chronic dialysis ([place])   . COVID-19 12/2019  . ESRD on dialysis [facility])    started 02/2018 [place]  . GERD (gastroesophageal reflux disease)   . Glaucoma    "I had surgery and  dont have it any more"  . Gout    daily RX (02/21/2018)  . Heart murmur    "mild" per pt  . Hepatitis    years ago  . HIV (human immunodeficiency virus infection) ([place])   . HIV infection ([place])   . Hyperlipidemia   . Hypertension   . Nonischemic cardiomyopathy ([place])   . Pneumonia 11/2017  . Prostate cancer ([place])    hx of prostate; s/p radioactive seed implant 10/2009 Dr Janice Norrie  . Sinus bradycardia   . Sleep apnea    does not use a cpap  . Stroke [facility])    "mini stroke" years ago  . Wears glasses     Past Surgical History:  Procedure Laterality Date  . A/V FISTULAGRAM Bilateral 07/30/2020   Procedure: CENTRAL VENOGRAM;  Surgeon: Angelia Mould, MD;  Location: [place] [facility];  Service: Cardiovascular;  Laterality: Bilateral;  bilateral upper venogram   . AV FISTULA PLACEMENT Right 10/13/2016   Procedure: ARTERIOVENOUS (AV) FISTULA CREATION;  Surgeon: [name], MD;  Location: [place];  Service: Vascular;  Laterality: Right;  . AV FISTULA PLACEMENT Left 02/25/2018   Procedure: INSERTION OF ARTERIOVENOUS (AV) GORE-TEX GRAFT LEFT UPPER ARM;  Surgeon: Angelia Mould, MD;  Location: [place];  Service: Vascular;  Laterality: Left;  . AV FISTULA PLACEMENT Right 08/07/2018   Procedure:  Creation of right arm brachiocephalic Fistula;  Surgeon: Waynetta Sandy, MD;  Location: [place];  Service: Vascular;  Laterality: Right;  . AV FISTULA PLACEMENT Left 11/10/2019   Procedure: INSERTION OF ARTERIOVENOUS (AV) GORE-TEX GRAFT THIGH;  Surgeon: Elam Dutch, MD;  Location: [place];  Service: Vascular;  Laterality: Left;  . AV FISTULA PLACEMENT Right 12/02/2019   Procedure: INSERTION OF ARTERIOVENOUS (AV) GORE-TEX GRAFT RIGHT  THIGH;  Surgeon: Waynetta Sandy, MD;  Location: [place];  Service: Vascular;  Laterality: Right;  . AV FISTULA PLACEMENT Right 08/13/2020   Procedure: INSERTION OF RIGHT ARTERIOVENOUS (AV) GORE-TEX GRAFT ARM;  Surgeon: Angelia Mould, MD;   Location: [place];  Service: Vascular;  Laterality: Right;  . BASCILIC VEIN TRANSPOSITION Left 06/24/2015   Procedure: BASILIC VEIN TRANSPOSITION;  Surgeon: Mal Misty, MD;  Location: [place];  Service: Vascular;  Laterality: Left;  . BIOPSY  03/14/2019   Procedure: BIOPSY;  Surgeon: Irene Shipper, MD;  Location: [place];  Service: Endoscopy;;  . CATARACT EXTRACTION, BILATERAL Bilateral   . COLONOSCOPY WITH PROPOFOL N/A 03/14/2019   Procedure: COLONOSCOPY WITH PROPOFOL;  Surgeon: Irene Shipper, MD;  Location: [facility] ENDOSCOPY;  Service: Endoscopy;  Laterality: N/A;  . ESOPHAGOGASTRODUODENOSCOPY (EGD) WITH PROPOFOL N/A 05/05/2014   Procedure: ESOPHAGOGASTRODUODENOSCOPY (EGD) WITH PROPOFOL;  Surgeon: Missy Sabins, MD;  Location: [facility] ENDOSCOPY;  Service: Endoscopy;  Laterality: N/A;  . EYE SURGERY Bilateral    cataract surgery   . GLAUCOMA SURGERY Bilateral   . INSERTION OF ARTERIOVENOUS (AV) ARTEGRAFT ARM  10/09/2018   Procedure: INSERTION OF ARTERIOVENOUS (AV) 80mm x 41cm ARTEGRAFT LEFT UPPER ARM;  Surgeon: Waynetta Sandy, MD;  Location: [place];  Service: Vascular;;  . INSERTION OF DIALYSIS CATHETER Right 07/14/2019   Procedure: Insertion Of Dialysis Catheter;  Surgeon: Elam Dutch, MD;  Location: [facility] OR;  Service: Vascular;  Laterality: Right;  placed right Internal Jugular  . INSERTION OF DIALYSIS CATHETER Left 08/13/2020   Procedure: INSERTION OF DIALYSIS CATHETER;  Surgeon: Angelia Mould, MD;  Location: [place];  Service: Vascular;  Laterality: Left;  . INSERTION PROSTATE RADIATION SEED    . IR FLUORO GUIDE CV LINE RIGHT  02/18/2018  . IR REMOVAL TUN CV CATH W/O FL  12/06/2018  . IR THROMBECTOMY AV FISTULA W/THROMBOLYSIS INC/SHUNT/IMG RIGHT  11/12/2020  . IR THROMBECTOMY AV FISTULA W/THROMBOLYSIS/PTA INC/SHUNT/IMG RIGHT Right 01/26/2020  . IR THROMBECTOMY AV FISTULA W/THROMBOLYSIS/PTA INC/SHUNT/IMG RIGHT Right 02/24/2020  . IR THROMBECTOMY AV FISTULA W/THROMBOLYSIS/PTA  INC/SHUNT/IMG RIGHT Right 04/07/2020  . IR US GUIDE VASC ACCESS RIGHT  02/18/2018  . IR US GUIDE VASC ACCESS RIGHT  01/26/2020  . IR US GUIDE VASC ACCESS RIGHT  04/07/2020  . IR US GUIDE VASC ACCESS RIGHT  11/12/2020  . LEFT HEART CATH AND CORONARY ANGIOGRAPHY N/A 02/20/2018   Procedure: LEFT HEART CATH AND CORONARY ANGIOGRAPHY;  Surgeon: Jettie Booze, MD;  Location: [place] [facility];;; 50% ostial OM2, 25% mLAD.  Marland Kitchen LEFT HEART CATH AND CORONARY ANGIOGRAPHY  2004   mildly depressed LV systolic fx EF 50%,[id number] coronaries/abdominal aorta/renal arteries.  . LOWER EXTREMITY ANGIOGRAM Left 11/17/2019   Procedure: Lower Extremity Fistulogram;  Surgeon: Marty Heck, MD;  Location: [place];  Service: Vascular;  Laterality: Left;  . NM [place]  02/21/2010   No ischemia or infarction.  EF 27%.  Marland Kitchen RADIOACTIVE SEED IMPLANT  2010   prostate cancer  . REMOVAL OF A DIALYSIS  CATHETER Right 08/13/2020   Procedure: REMOVAL OF A DIALYSIS CATHETER;  Surgeon: Angelia Mould, MD;  Location: [facility] OR;  Service: Vascular;  Laterality: Right;  . REVISION OF ARTERIOVENOUS GORETEX GRAFT Right 07/11/2019   Procedure: REVISION OF ARTERIOVENOUS GORETEX GRAFT RIGHT ARM WITH ARTEGRAFT;  Surgeon: Serafina Mitchell, MD;  Location: [place];  Service: Vascular;  Laterality: Right;  . SHUNTOGRAM Right 07/14/2019   Procedure: Shuntogram;  Surgeon: Elam Dutch, MD;  Location: [place];  Service: Vascular;  Laterality: Right;  . THROMBECTOMY AND REVISION OF ARTERIOVENTOUS (AV) GORETEX  GRAFT Right 07/14/2019   Procedure: THROMBECTOMY AND REVISION OF ARTERIOVENTOUS (AV) GORETEX  GRAFT;  Surgeon: Elam Dutch, MD;  Location: [place];  Service: Vascular;  Laterality: Right;  . THROMBECTOMY W/ EMBOLECTOMY Left 02/26/2018   Procedure: THROMBECTOMY ARTERIOVENOUS GRAFT;  Surgeon: Angelia Mould, MD;  Location: [place];  Service: Vascular;  Laterality: Left;  . THROMBECTOMY W/ EMBOLECTOMY Left 11/17/2019    Procedure: THROMBECTOMY ARTERIOVENOUS GRAFT LEFT THIGH;  Surgeon: Marty Heck, MD;  Location: [place];  Service: Vascular;  Laterality: Left;  . TRANSTHORACIC ECHOCARDIOGRAM  02/2012   [facility]) EF 45-50% with mild global HK.  Mild LVH,LA mod. dilated,mild-mod. 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 AI.  Marland Kitchen TRANSTHORACIC ECHOCARDIOGRAM  9/'18; 3/'19   a) Severely reduced EF.  25 from 30%.  GR 1 DD with diffuse ecchymosis. Mild AS & AI.  Mild LA/RA dilation; b) (post PEA arest):   Moderately dilated LV with moderate concentric virtually.  EF 15 to 20%.  GR 1 DD.  Paradoxical septal motion. Mild AI.  Mild LA dilation.  Poorly visualized RV.  Marland Kitchen TRANSTHORACIC ECHOCARDIOGRAM  02/2019   EF 20-25%.  Unable to assess diastolic function (A. Fib).  No LV apical thrombus.  Mild AI.  Mildly reduced RV function, however poorly visualized.  Marland Kitchen ULTRASOUND GUIDANCE FOR VASCULAR ACCESS  02/20/2018   Procedure: Ultrasound Guidance For Vascular Access;  Surgeon: Jettie Booze, MD;  Location: [place] [facility];  Service: Cardiovascular;;  . UPPER EXTREMITY VENOGRAPHY N/A 07/08/2018   Procedure: UPPER EXTREMITY VENOGRAPHY;  Surgeon: Waynetta Sandy, MD;  Location: [place] [facility];  Service: Cardiovascular;  Laterality: N/A;  Bilateral    Family History  Problem Relation Age of Onset  . Hypertension Mother   . Thyroid disease Mother   . Cholelithiasis Daughter   . Cholelithiasis Son   . Hypertension Maternal Grandmother   . Diabetes Maternal Grandmother   . Heart attack Neg Hx   . Hyperlipidemia Neg Hx     Social History   Socioeconomic History  . Marital status: Married    Spouse name: Not on file  . Number of children: 2  . Years of education: 23  . Highest education level: Not on file  Occupational History  . 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 Smoking status: Former Smoker    Packs/day: 1.00    Years:  30.00    Pack years: 30.00    Types: Cigarettes    Quit date: 2006    Years since quitting: 16.3  . Smokeless tobacco: Never Used  Vaping Use  . Vaping Use: Never used  Substance and Sexual Activity  . Alcohol use: Yes    Alcohol/week: 1.0 standard drink    Types: 1 Shots of liquor per week    Comment: occasional  . Drug use: Never  .  Sexual activity: Not Currently    Partners: Female    Comment: declined condoms 09/2019  Other Topics Concern  . 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   Social Determinants of Health   Financial Resource Strain: Low Risk   . Difficulty of Paying Living Expenses: Not hard at all  Food Insecurity: No Food Insecurity  . Worried About Charity fundraiser in the Last Year: Never true  . Ran Out of Food in the Last Year: Never true  Transportation Needs: No Transportation Needs  . Lack of Transportation (Medical): No  . Lack of Transportation (Non-Medical): No  Physical Activity: Not on file  Stress: Not on file  Social Connections: Not on file  Intimate Partner Violence: Not on file    Outpatient Medications Prior to Visit  Medication Sig Dispense Refill  . acetaminophen (TYLENOL) 500 MG tablet Take 1,000 mg by mouth every 6 (six) hours as needed for moderate pain.    Marland Kitchen allopurinol (ZYLOPRIM) 100 MG tablet TAKE 1 TABLET BY MOUTH 4 TIMES WEEKLY( MONDAY, TUESDAY,THURDSAY AND SATURDAY) AFTER DIALYSIS (Patient taking differently: Take 100 mg by mouth See admin instructions. TAKE 1 TABLET BY MOUTH 4 TIMES WEEKLY( MONDAY, TUESDAY,THURDSAY AND SATURDAY) AFTER DIALYSIS) 45 tablet 5  . amiodarone (PACERONE) 200 MG tablet TAKE 1 TABLET(200 MG) BY MOUTH DAILY (Patient taking differently: Take 200 mg by mouth daily.) 90 tablet 3  . apixaban (ELIQUIS) 5 MG  TABS tablet TAKE 1 TABLET(5 MG) BY MOUTH TWICE DAILY 180 tablet 1  . [name] 50-200-25 MG TABS tablet TAKE 1 TABLET BY MOUTH DAILY 30 tablet 5  . brimonidine (ALPHAGAN P) 0.1 % SOLN Place 1 drop into the right eye 2 (two) times daily.    . dorzolamide-timolol (COSOPT) 22.3-6.8 MG/ML ophthalmic solution Place 1 drop into the right eye 2 (two) times daily.    . heparin 1000 unit/mL SOLN injection Heparin Sodium (Porcine) 1,000 Units/mL Catheter Lock Arterial    . levofloxacin (LEVAQUIN) 500 MG tablet Take 1 tablet (500 mg total) by mouth daily for 10 days. 10 tablet 0  . lidocaine-prilocaine (EMLA) cream Apply 1 application topically See admin instructions. Use on dialysis days    . midodrine (PROAMATINE) 10 MG tablet Take 1 tablet (10 mg total) by mouth as directed. Twice a day on the day of HD Tuesday, Thursday, Saturday (1 in AM and 1 at [place]). Take 1/2 tablet (5mg ) a day on non HD days (Patient taking differently: Take 10-20 mg by mouth See admin instructions. Take 10 mg in the morning at 5 AM. take 10 mg at 6AM of dialysis days and 10 mg half way through treatment on dialysis days (Mon, Tues, Thurs, and Sat)) 90 tablet 5  . montelukast (SINGULAIR) 10 MG tablet Take 1 tablet (10 mg total) by mouth at bedtime. 30 tablet 5  . multivitamin (RENA-VIT) TABS tablet Take 1 tablet by mouth every evening.    Marland Kitchen ofloxacin (OCUFLOX) 0.3 % ophthalmic solution Place 1 drop into the right eye 4 (four) times daily. 5 mL   . pantoprazole (PROTONIX) 40 MG tablet Take 1 tablet (40 mg total) by mouth daily. 30 tablet 3  . sucroferric oxyhydroxide (  VELPHORO) 500 MG chewable tablet Chew 500 mg by mouth 3 (three) times daily with meals.     No facility-administered medications prior to visit.    Allergies  Allergen Reactions  . Dextromethorphan-Guaifenesin Other (See Comments)    Unknown reaction  . Tocotrienols Other (See Comments)    Unknown reaction  . Losartan Potassium Other (See Comments)    Causes  constipation    ROS     Objective:    Physical Exam Constitutional:      Appearance: Normal appearance.  HENT:     Head: Normocephalic and atraumatic.     Right Ear: External ear normal.     Left Ear: External ear normal.  Eyes:     Extraocular Movements: Extraocular movements intact.     Pupils: Pupils are equal, round, and reactive to light.  Cardiovascular:     Rate and Rhythm: Normal rate and regular rhythm.     Pulses: Normal pulses.     Heart sounds: Normal heart sounds. No murmur heard. No friction rub. No gallop.   Pulmonary:     Effort: Pulmonary effort is normal. No respiratory distress.     Breath sounds: Normal breath sounds. No stridor. No wheezing, rhonchi or rales.  Musculoskeletal:     Comments: Decreased strength in both lower extremities.  Neurological:     Mental Status: He is alert and oriented to person, place, and time.  Psychiatric:        Behavior: Behavior normal.     There were no vitals taken for this visit. Wt Readings from Last 3 Encounters:  04/06/21 186 lb (84.4 kg)  02/22/21 186 lb (84.4 kg)  01/11/21 189 lb (85.7 kg)    Diabetic Foot Exam - Simple   No data filed    Lab Results  Component Value Date   WBC 5.6 01/12/2021   HGB 9.6 (L) 01/12/2021   HCT 29.6 (L) 01/12/2021   PLT 203 01/12/2021   GLUCOSE 102 (H) 12/01/2020   CHOL 194 06/28/2020   TRIG 126 06/28/2020   HDL 55 06/28/2020   LDLCALC 115 (H) 06/28/2020   ALT 22 12/01/2020   AST 20 12/01/2020   NA 141 12/01/2020   K 3.7 12/01/2020   CL 94 (L) 12/01/2020   CREATININE 6.02 (H) 12/01/2020   BUN 23 12/01/2020   CO2 34 (H) 12/01/2020   TSH 1.16 06/28/2020   PSA <0.01 (L) 05/13/2014   INR 1.3 (A) 08/19/2020   HGBA1C 5.4 01/07/2020   MICROALBUR 2.2 12/17/2017    Lab Results  Component Value Date   TSH 1.16 06/28/2020   Lab Results  Component Value Date   WBC 5.6 01/12/2021   HGB 9.6 (L) 01/12/2021   HCT 29.6 (L) 01/12/2021   MCV 102.8 (H) 01/12/2021    PLT 203 01/12/2021   Lab Results  Component Value Date   NA 141 12/01/2020   K 3.7 12/01/2020   CO2 34 (H) 12/01/2020   GLUCOSE 102 (H) 12/01/2020   BUN 23 12/01/2020   CREATININE 6.02 (H) 12/01/2020   BILITOT 0.5 12/01/2020   ALKPHOS 84 12/01/2020   AST 20 12/01/2020   ALT 22 12/01/2020   PROT 6.4 (L) 12/01/2020   ALBUMIN 2.8 (L) 12/01/2020   CALCIUM 8.9 12/01/2020   ANIONGAP 13 12/01/2020   GFR 11.50 (LL) 05/24/2020   Lab Results  Component Value Date   CHOL 194 06/28/2020   Lab Results  Component Value Date   HDL 55 06/28/2020  Lab Results  Component Value Date   LDLCALC 115 (H) 06/28/2020   Lab Results  Component Value Date   TRIG 126 06/28/2020   Lab Results  Component Value Date   CHOLHDL 3.5 06/28/2020   Lab Results  Component Value Date   HGBA1C 5.4 01/07/2020       Assessment & Plan:   Problem List Items Addressed This Visit   None      No orders of the defined types were placed in this encounter.   I, [name] NP, personally preformed the services described in this documentation.  All medical record entries made by the scribe were at my direction and in my presence.  I have reviewed the chart and discharge instructions (if applicable) and agree that the record reflects my personal performance and is accurate and complete. 04/12/2021   I,David [name],acting as a Education administrator for [name], NP.,have documented all relevant documentation on the behalf of [name], NP,as directed by  [name], NP while in the presence of [name], NP.   David [employer]

## 2021-04-13 DIAGNOSIS — H402234 Chronic angle-closure glaucoma, bilateral, indeterminate stage: Secondary | ICD-10-CM | POA: Diagnosis not present

## 2021-04-14 DIAGNOSIS — N186 End stage renal disease: Secondary | ICD-10-CM | POA: Diagnosis not present

## 2021-04-14 DIAGNOSIS — Z992 Dependence on renal dialysis: Secondary | ICD-10-CM | POA: Diagnosis not present

## 2021-04-14 DIAGNOSIS — N2581 Secondary hyperparathyroidism of renal origin: Secondary | ICD-10-CM | POA: Diagnosis not present

## 2021-04-16 DIAGNOSIS — N2581 Secondary hyperparathyroidism of renal origin: Secondary | ICD-10-CM | POA: Diagnosis not present

## 2021-04-16 DIAGNOSIS — Z992 Dependence on renal dialysis: Secondary | ICD-10-CM | POA: Diagnosis not present

## 2021-04-16 DIAGNOSIS — N186 End stage renal disease: Secondary | ICD-10-CM | POA: Diagnosis not present

## 2021-04-18 DIAGNOSIS — N186 End stage renal disease: Secondary | ICD-10-CM | POA: Diagnosis not present

## 2021-04-18 DIAGNOSIS — Z992 Dependence on renal dialysis: Secondary | ICD-10-CM | POA: Diagnosis not present

## 2021-04-18 DIAGNOSIS — N2581 Secondary hyperparathyroidism of renal origin: Secondary | ICD-10-CM | POA: Diagnosis not present

## 2021-04-19 DIAGNOSIS — N451 Epididymitis: Secondary | ICD-10-CM | POA: Diagnosis not present

## 2021-04-19 DIAGNOSIS — I132 Hypertensive heart and chronic kidney disease with heart failure and with stage 5 chronic kidney disease, or end stage renal disease: Secondary | ICD-10-CM | POA: Diagnosis not present

## 2021-04-19 DIAGNOSIS — M150 Primary generalized (osteo)arthritis: Secondary | ICD-10-CM | POA: Diagnosis not present

## 2021-04-19 DIAGNOSIS — I428 Other cardiomyopathies: Secondary | ICD-10-CM | POA: Diagnosis not present

## 2021-04-19 DIAGNOSIS — D509 Iron deficiency anemia, unspecified: Secondary | ICD-10-CM | POA: Diagnosis not present

## 2021-04-19 DIAGNOSIS — N2581 Secondary hyperparathyroidism of renal origin: Secondary | ICD-10-CM | POA: Diagnosis not present

## 2021-04-19 DIAGNOSIS — M109 Gout, unspecified: Secondary | ICD-10-CM | POA: Diagnosis not present

## 2021-04-19 DIAGNOSIS — I4891 Unspecified atrial fibrillation: Secondary | ICD-10-CM | POA: Diagnosis not present

## 2021-04-19 DIAGNOSIS — I5043 Acute on chronic combined systolic (congestive) and diastolic (congestive) heart failure: Secondary | ICD-10-CM | POA: Diagnosis not present

## 2021-04-19 DIAGNOSIS — Z992 Dependence on renal dialysis: Secondary | ICD-10-CM | POA: Diagnosis not present

## 2021-04-19 DIAGNOSIS — N186 End stage renal disease: Secondary | ICD-10-CM | POA: Diagnosis not present

## 2021-04-20 ENCOUNTER — Ambulatory Visit: Payer: Self-pay

## 2021-04-20 ENCOUNTER — Encounter: Payer: Self-pay | Admitting: Physician Assistant

## 2021-04-20 ENCOUNTER — Ambulatory Visit (INDEPENDENT_AMBULATORY_CARE_PROVIDER_SITE_OTHER): Payer: Medicare HMO | Admitting: Physician Assistant

## 2021-04-20 DIAGNOSIS — M25552 Pain in left hip: Secondary | ICD-10-CM | POA: Diagnosis not present

## 2021-04-20 DIAGNOSIS — M1612 Unilateral primary osteoarthritis, left hip: Secondary | ICD-10-CM | POA: Diagnosis not present

## 2021-04-20 NOTE — Progress Notes (Signed)
Subjective: Patient is here for ultrasound-guided intra-articular left hip injection.   Pain from DJD.  Objective:  Pain and stiffness with IR.  Procedure: Ultrasound guided injection is preferred based studies that show increased duration, increased effect, greater accuracy, decreased procedural pain, increased response rate, and decreased cost with ultrasound guided versus blind injection.   Verbal informed consent obtained.  Time-out conducted.  Noted no overlying erythema, induration, or other signs of local infection. Ultrasound-guided left hip injection: After sterile prep with Betadine, injected 4 cc 0.25% bupivacaine without epinephrine and 6 mg betamethasone using a 22-gauge spinal needle, passing the needle through the iliofemoral ligament into the femoral head/neck junction.  Injectate was seen filling the joint capsule.  He had a moderate-sized joint effusion with clear yellow synovial fluid.

## 2021-04-20 NOTE — Progress Notes (Signed)
Office Visit Note   Patient: David [name]           Date of Birth: [birth year]           MRN: [id number] Visit Date: 04/20/2021              Requested by: [name], NP [address] [place],  [place] [address] PCP: [name], NP   Assessment & Plan: Visit Diagnoses:  1. Primary osteoarthritis of left hip     Plan: Given patient's multiple comorbidities and patient's desire to just have some pain relief we will try an intra-articular injection in the left hip.  I did discuss with him that I am pessimistic that this will relieve his pain but this is the least invasive treatment we have to offer.  We will see him back in 4 weeks to see what type of response he had to the injection.  Questions were encouraged and answered  Follow-Up Instructions: Return in about 4 weeks (around 05/18/2021).   Orders:  Orders Placed This Encounter  Procedures  . US Guided Needle Placement - No Linked Charges   No orders of the defined types were placed in this encounter.     Procedures: No procedures performed   Clinical Data: No additional findings.   Subjective: Chief Complaint  Patient presents with  . Left Hip - Pain    HPI David [name] pleasant 80 year old male comes in today with left hip pain is been ongoing for few months.  He has had no known injury.  He has difficulty ambulating due to the pain.  Patient reports that he fell for 5 months ago had pain in the hip since then.  7 difficulty donning shoes socks.  Difficulty with transfers secondary to the left hip pain. Medical history pertinent for heart failure, atrial fibrillation, congestive heart failure, chronic kidney disease on dialysis, HIV, history of stroke and chronic anticoagulation. Left hip x-rays dated 12/15/2020 are reviewed and show severe end-stage arthritis involving the left hip complete loss of the hip joint space.  Periarticular spurring, subchondral cyst and sclerotic  changes of the femoral head are present.  No acute fracture.  Prostate radiation seeds seen in the region of the pubic symphysis.  Moderate narrowing of the right hip joint. CT scan left hip without contrast dated 12/15/2020 showed no acute findings.  Given severe advanced arthritis left hip was noted.  Review of Systems See HPI otherwise negative or noncontributory.  Objective: Vital Signs: There were no vitals taken for this visit.  Physical Exam Constitutional:      General: He is not in acute distress.    Appearance: He is not diaphoretic.  Pulmonary:     Effort: Pulmonary effort is normal.  Neurological:     Mental Status: He is alert and oriented to person, place, and time.  Psychiatric:        Mood and Affect: Mood normal.     Ortho Exam Right hip good range of motion without pain.  Left hip he has no internal and external rotation attempts are painful. Specialty Comments:  No specialty comments available.  Imaging: US Guided Needle Placement - No Linked Charges  Result Date: 04/20/2021 Ultrasound guided injection is preferred based studies that show increased duration, increased effect, greater accuracy, decreased procedural pain, increased response rate, and decreased cost with ultrasound guided versus blind injection.   Verbal informed consent obtained.  Time-out conducted.  Noted no overlying erythema, induration, or  other signs of local infection. Ultrasound-guided left hip injection: After sterile prep with Betadine, injected 4 cc 0.25% bupivacaine without epinephrine and 6 mg betamethasone using a 22-gauge spinal needle, passing the needle through the iliofemoral ligament into the femoral head/neck junction.  Injectate was seen filling the joint capsule.  He had a moderate-sized joint effusion with clear yellow synovial fluid.     PMFS History: Patient Active Problem List   Diagnosis Date Noted  . Gait disorder 04/12/2021  . Complication of vascular dialysis  catheter 09/29/2020  . Disease of gallbladder, unspecified 05/18/2020  . Elevated LFTs   . Abdominal pain   . Transaminitis 05/15/2020  . Full code status 02/03/2020  . Subtherapeutic international normalized ratio (INR) 01/24/2020  . Pressure injury of skin 01/07/2020  . Combined systolic and diastolic heart failure ([place])   . COVID-19 12/11/2019  . Acute respiratory failure with hypoxemia ([place]) 11/01/2019  . Closed fracture of nasal bones 09/10/2019  . Hyperkalemia 09/02/2019  . Shortness of breath 04/12/2019  . 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 Dependence on renal dialysis ([place]) 03/27/2019  . Hypertensive heart and chronic kidney disease with heart failure and with stage 5 chronic kidney disease, or end stage renal disease ([place]) 03/27/2019  . Mesenteric ischemia ([place])   . Colonic ulcer   . Right lower quadrant pain   . Abnormal CT of the abdomen   . Ischemic colitis ([place])   . Colitis presumed infectious 03/09/2019  . Acute respiratory failure with hypoxia and hypercapnia ([place]) 03/04/2019  . Pruritus, unspecified 02/21/2019  . Acute pulmonary edema ([place]) 02/11/2019  . Pain, unspecified 01/21/2019  . Paroxysmal atrial fibrillation ([place]) 11/16/2018  . LBBB (left bundle branch block) 11/16/2018  . Pulmonary edema 10/21/2018  . Gout 10/21/2018  . Hypertensive emergency 10/21/2018  . Chronic combined systolic and diastolic heart failure, NYHA class 3 ([place]) 10/21/2018  . Leukocytosis 10/21/2018  . Gastroesophageal reflux disease 08/28/2018  . CAD (coronary artery disease) 04/03/2018  . Palliative care encounter 03/27/2018  . Atrial fibrillation, chronic ([place]) 03/25/2018  . Anemia in chronic kidney disease 03/12/2018  . Coagulation defect, unspecified ([place]) 03/12/2018  . Secondary hyperparathyroidism of renal origin ([place]) 03/12/2018  . Palliative care by specialist   . Advance care planning   . Goals of care, counseling/discussion   . Respiratory arrest ([place])   . Acute on  chronic systolic heart failure ([place])   . Chronic obstructive pulmonary disease ([place])   . HIV (human immunodeficiency virus infection) ([place])   . Pneumonia   . Aspiration pneumonia of both lower lobes due to regurgitated food ([place])   . Sepsis due to pneumonia ([place]) 11/22/2017  . AIDS ([place]) 05/14/2014  . 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 Gastroparesis 05/06/2014  . Protein-calorie malnutrition, severe ([place]) 05/05/2014  . ESRD on hemodialysis ([place]) 05/02/2014  . Hemodialysis-associated hypotension 05/02/2014  . Leaking of conjunctival drainage bleb 05/01/2014  . POAG (primary open-angle glaucoma) 05/01/2014  . Loss of weight 04/29/2014  . Conjunctivitis 04/29/2014  . 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 Low back pain 04/09/2012  . Osteoarthritis of hip 12/29/2011  . Iron deficiency anemia 06/28/2011  . Macrocytic anemia 04/02/2011  . 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 Hyperlipidemia 11/30/2010  . Transient cerebral ischemia 11/30/2010  . HYPERGLYCEMIA 11/30/2010   Past Medical History:  Diagnosis Date  . 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 Acute on chronic systolic and diastolic heart failure, NYHA class 4 ([place])   . Anemia, iron deficiency 11/15/2011  .  Arthritis    "hands, right knee, feet" (02/21/2018)  . Atrial fibrillation ([place])   . Cardiac arrest ([place]) 02/17/2018  . CHF (congestive heart failure) ([place])   . Chronic combined systolic and diastolic heart failure, NYHA class 3 ([place]) 06/2010   felt to be secondary to hypertensive cardiomyopathy  . Chronic lower back pain   . CKD (chronic kidney disease) stage V requiring chronic dialysis ([place])   . COVID-19 12/2019  . ESRD on dialysis [facility])    started 02/2018 [place]  . GERD (gastroesophageal reflux disease)   . Glaucoma    "I had surgery and dont have it any more"  . Gout    daily RX (02/21/2018)  . Heart murmur    "mild" per pt  . Hepatitis    years ago  .  HIV (human immunodeficiency virus infection) ([place])   . HIV infection ([employer])   . Hyperlipidemia   . Hypertension   . Nonischemic cardiomyopathy ([place])   . Pneumonia 11/2017  . Prostate cancer ([place])    hx of prostate; s/p radioactive seed implant 10/2009 Dr Janice Norrie  . Sinus bradycardia   . Sleep apnea    does not use a cpap  . Stroke [facility])    "mini stroke" years ago  . Wears glasses     Family History  Problem Relation Age of Onset  . Hypertension Mother   . Thyroid disease Mother   . Cholelithiasis Daughter   . Cholelithiasis Son   . Hypertension Maternal Grandmother   . Diabetes Maternal Grandmother   . Heart attack Neg Hx   . Hyperlipidemia Neg Hx     Past Surgical History:  Procedure Laterality Date  . A/V FISTULAGRAM Bilateral 07/30/2020   Procedure: CENTRAL VENOGRAM;  Surgeon: Angelia Mould, MD;  Location: [facility];  Service: Cardiovascular;  Laterality: Bilateral;  bilateral upper venogram   . AV FISTULA PLACEMENT Right 10/13/2016   Procedure: ARTERIOVENOUS (AV) FISTULA CREATION;  Surgeon: [name], MD;  Location: [place];  Service: Vascular;  Laterality: Right;  . AV FISTULA PLACEMENT Left 02/25/2018   Procedure: INSERTION OF ARTERIOVENOUS (AV) GORE-TEX GRAFT LEFT UPPER ARM;  Surgeon: Angelia Mould, MD;  Location: [place];  Service: Vascular;  Laterality: Left;  . AV FISTULA PLACEMENT Right 08/07/2018   Procedure: Creation of right arm brachiocephalic Fistula;  Surgeon: Waynetta Sandy, MD;  Location: [place];  Service: Vascular;  Laterality: Right;  . AV FISTULA PLACEMENT Left 11/10/2019   Procedure: INSERTION OF ARTERIOVENOUS (AV) GORE-TEX GRAFT THIGH;  Surgeon: Elam Dutch, MD;  Location: [place];  Service: Vascular;  Laterality: Left;  . AV FISTULA PLACEMENT Right 12/02/2019   Procedure: INSERTION OF ARTERIOVENOUS (AV) GORE-TEX GRAFT RIGHT  THIGH;  Surgeon: Waynetta Sandy, MD;  Location: [place];  Service: Vascular;  Laterality:  Right;  . AV FISTULA PLACEMENT Right 08/13/2020   Procedure: INSERTION OF RIGHT ARTERIOVENOUS (AV) GORE-TEX GRAFT ARM;  Surgeon: Angelia Mould, MD;  Location: [place];  Service: Vascular;  Laterality: Right;  . BASCILIC VEIN TRANSPOSITION Left 06/24/2015   Procedure: BASILIC VEIN TRANSPOSITION;  Surgeon: Mal Misty, MD;  Location: [place];  Service: Vascular;  Laterality: Left;  . BIOPSY  03/14/2019   Procedure: BIOPSY;  Surgeon: Irene Shipper, MD;  Location: [place];  Service: Endoscopy;;  . CATARACT EXTRACTION, BILATERAL Bilateral   . COLONOSCOPY WITH PROPOFOL N/A 03/14/2019   Procedure: COLONOSCOPY WITH PROPOFOL;  Surgeon:  Irene Shipper, MD;  Location: [facility] ENDOSCOPY;  Service: Endoscopy;  Laterality: N/A;  . ESOPHAGOGASTRODUODENOSCOPY (EGD) WITH PROPOFOL N/A 05/05/2014   Procedure: ESOPHAGOGASTRODUODENOSCOPY (EGD) WITH PROPOFOL;  Surgeon: Missy Sabins, MD;  Location: [facility] ENDOSCOPY;  Service: Endoscopy;  Laterality: N/A;  . EYE SURGERY Bilateral    cataract surgery   . GLAUCOMA SURGERY Bilateral   . INSERTION OF ARTERIOVENOUS (AV) ARTEGRAFT ARM  10/09/2018   Procedure: INSERTION OF ARTERIOVENOUS (AV) 59mm x 41cm ARTEGRAFT LEFT UPPER ARM;  Surgeon: Waynetta Sandy, MD;  Location: [place];  Service: Vascular;;  . INSERTION OF DIALYSIS CATHETER Right 07/14/2019   Procedure: Insertion Of Dialysis Catheter;  Surgeon: Elam Dutch, MD;  Location: [facility] OR;  Service: Vascular;  Laterality: Right;  placed right Internal Jugular  . INSERTION OF DIALYSIS CATHETER Left 08/13/2020   Procedure: INSERTION OF DIALYSIS CATHETER;  Surgeon: Angelia Mould, MD;  Location: [place];  Service: Vascular;  Laterality: Left;  . INSERTION PROSTATE RADIATION SEED    . IR FLUORO GUIDE CV LINE RIGHT  02/18/2018  . IR REMOVAL TUN CV CATH W/O FL  12/06/2018  . IR THROMBECTOMY AV FISTULA W/THROMBOLYSIS INC/SHUNT/IMG RIGHT  11/12/2020  . IR THROMBECTOMY AV FISTULA W/THROMBOLYSIS/PTA INC/SHUNT/IMG RIGHT  Right 01/26/2020  . IR THROMBECTOMY AV FISTULA W/THROMBOLYSIS/PTA INC/SHUNT/IMG RIGHT Right 02/24/2020  . IR THROMBECTOMY AV FISTULA W/THROMBOLYSIS/PTA INC/SHUNT/IMG RIGHT Right 04/07/2020  . IR US GUIDE VASC ACCESS RIGHT  02/18/2018  . IR US GUIDE VASC ACCESS RIGHT  01/26/2020  . IR US GUIDE VASC ACCESS RIGHT  04/07/2020  . IR US GUIDE VASC ACCESS RIGHT  11/12/2020  . LEFT HEART CATH AND CORONARY ANGIOGRAPHY N/A 02/20/2018   Procedure: LEFT HEART CATH AND CORONARY ANGIOGRAPHY;  Surgeon: Jettie Booze, MD;  Location: [facility];;; 50% ostial OM2, 25% mLAD.  Marland Kitchen LEFT HEART CATH AND CORONARY ANGIOGRAPHY  2004   mildly depressed LV systolic fx EF 38%,HWEXHB coronaries/abdominal aorta/renal arteries.  . LOWER EXTREMITY ANGIOGRAM Left 11/17/2019   Procedure: Lower Extremity Fistulogram;  Surgeon: Marty Heck, MD;  Location: [place];  Service: Vascular;  Laterality: Left;  . NM [place]  02/21/2010   No ischemia or infarction.  EF 27%.  Marland Kitchen RADIOACTIVE SEED IMPLANT  2010   prostate cancer  . REMOVAL OF A DIALYSIS CATHETER Right 08/13/2020   Procedure: REMOVAL OF A DIALYSIS CATHETER;  Surgeon: Angelia Mould, MD;  Location: [facility] OR;  Service: Vascular;  Laterality: Right;  . REVISION OF ARTERIOVENOUS GORETEX GRAFT Right 07/11/2019   Procedure: REVISION OF ARTERIOVENOUS GORETEX GRAFT RIGHT ARM WITH ARTEGRAFT;  Surgeon: Serafina Mitchell, MD;  Location: [place];  Service: Vascular;  Laterality: Right;  . SHUNTOGRAM Right 07/14/2019   Procedure: Shuntogram;  Surgeon: Elam Dutch, MD;  Location: [place];  Service: Vascular;  Laterality: Right;  . THROMBECTOMY AND REVISION OF ARTERIOVENTOUS (AV) GORETEX  GRAFT Right 07/14/2019   Procedure: THROMBECTOMY AND REVISION OF ARTERIOVENTOUS (AV) GORETEX  GRAFT;  Surgeon: Elam Dutch, MD;  Location: [place];  Service: Vascular;  Laterality: Right;  . THROMBECTOMY W/ EMBOLECTOMY Left 02/26/2018   Procedure: THROMBECTOMY ARTERIOVENOUS  GRAFT;  Surgeon: Angelia Mould, MD;  Location: [place];  Service: Vascular;  Laterality: Left;  . THROMBECTOMY W/ EMBOLECTOMY Left 11/17/2019   Procedure: THROMBECTOMY ARTERIOVENOUS GRAFT LEFT THIGH;  Surgeon: Marty Heck, MD;  Location: [place];  Service: Vascular;  Laterality: Left;  . TRANSTHORACIC ECHOCARDIOGRAM  02/2012   (  [place]) EF 45-50% with mild global HK.  Mild LVH,LA mod. dilated,mild-mod. 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 AI.  Marland Kitchen TRANSTHORACIC ECHOCARDIOGRAM  9/'18; 3/'19   a) Severely reduced EF.  25 from 30%.  GR 1 DD with diffuse ecchymosis. Mild AS & AI.  Mild LA/RA dilation; b) (post PEA arest):   Moderately dilated LV with moderate concentric virtually.  EF 15 to 20%.  GR 1 DD.  Paradoxical septal motion. Mild AI.  Mild LA dilation.  Poorly visualized RV.  Marland Kitchen TRANSTHORACIC ECHOCARDIOGRAM  02/2019   EF 20-25%.  Unable to assess diastolic function (A. Fib).  No LV apical thrombus.  Mild AI.  Mildly reduced RV function, however poorly visualized.  Marland Kitchen ULTRASOUND GUIDANCE FOR VASCULAR ACCESS  02/20/2018   Procedure: Ultrasound Guidance For Vascular Access;  Surgeon: Jettie Booze, MD;  Location: [place] [facility];  Service: Cardiovascular;;  . UPPER EXTREMITY VENOGRAPHY N/A 07/08/2018   Procedure: UPPER EXTREMITY VENOGRAPHY;  Surgeon: Waynetta Sandy, MD;  Location: [place] [facility];  Service: Cardiovascular;  Laterality: N/A;  Bilateral   Social History   Occupational History  . 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 Smoking status: Former Smoker    Packs/day: 1.00    Years: 30.00    Pack years: 30.00    Types: Cigarettes    Quit date: 2006    Years since quitting: 16.3  . Smokeless tobacco: Never Used  Vaping Use  . Vaping Use: Never used  Substance and Sexual Activity  . Alcohol use: Yes    Alcohol/week: 1.0 standard drink    Types: 1 Shots of liquor per week     Comment: occasional  . Drug use: Never  . Sexual activity: Not Currently    Partners: Female    Comment: declined condoms 09/2019

## 2021-04-20 NOTE — Patient Instructions (Signed)
   For arthritis:  Glucosamine Sulfate 1,000 mg twice daily (can't use this if allergic to shellfish).  Turmeric:  500 mg twice daily  Also, minimize intake of sugar.

## 2021-04-21 DIAGNOSIS — N186 End stage renal disease: Secondary | ICD-10-CM | POA: Diagnosis not present

## 2021-04-21 DIAGNOSIS — N2581 Secondary hyperparathyroidism of renal origin: Secondary | ICD-10-CM | POA: Diagnosis not present

## 2021-04-21 DIAGNOSIS — Z992 Dependence on renal dialysis: Secondary | ICD-10-CM | POA: Diagnosis not present

## 2021-04-22 ENCOUNTER — Telehealth: Payer: Self-pay

## 2021-04-22 NOTE — Telephone Encounter (Signed)
correct 

## 2021-04-22 NOTE — Telephone Encounter (Signed)
Received call from [facility] at [facility] for patient, he is getting PT they are asking for verbal orders for OC evaluation  Orders given.

## 2021-04-22 NOTE — Telephone Encounter (Signed)
Do you mean OT?  Yes, ok for OT evaluation.

## 2021-04-23 DIAGNOSIS — N186 End stage renal disease: Secondary | ICD-10-CM | POA: Diagnosis not present

## 2021-04-23 DIAGNOSIS — Z992 Dependence on renal dialysis: Secondary | ICD-10-CM | POA: Diagnosis not present

## 2021-04-23 DIAGNOSIS — N2581 Secondary hyperparathyroidism of renal origin: Secondary | ICD-10-CM | POA: Diagnosis not present

## 2021-04-25 DIAGNOSIS — N186 End stage renal disease: Secondary | ICD-10-CM | POA: Diagnosis not present

## 2021-04-25 DIAGNOSIS — N2581 Secondary hyperparathyroidism of renal origin: Secondary | ICD-10-CM | POA: Diagnosis not present

## 2021-04-25 DIAGNOSIS — Z992 Dependence on renal dialysis: Secondary | ICD-10-CM | POA: Diagnosis not present

## 2021-04-26 DIAGNOSIS — N2581 Secondary hyperparathyroidism of renal origin: Secondary | ICD-10-CM | POA: Diagnosis not present

## 2021-04-26 DIAGNOSIS — I132 Hypertensive heart and chronic kidney disease with heart failure and with stage 5 chronic kidney disease, or end stage renal disease: Secondary | ICD-10-CM | POA: Diagnosis not present

## 2021-04-26 DIAGNOSIS — Z992 Dependence on renal dialysis: Secondary | ICD-10-CM | POA: Diagnosis not present

## 2021-04-26 DIAGNOSIS — M150 Primary generalized (osteo)arthritis: Secondary | ICD-10-CM | POA: Diagnosis not present

## 2021-04-26 DIAGNOSIS — D509 Iron deficiency anemia, unspecified: Secondary | ICD-10-CM | POA: Diagnosis not present

## 2021-04-26 DIAGNOSIS — I428 Other cardiomyopathies: Secondary | ICD-10-CM | POA: Diagnosis not present

## 2021-04-26 DIAGNOSIS — N186 End stage renal disease: Secondary | ICD-10-CM | POA: Diagnosis not present

## 2021-04-26 DIAGNOSIS — I4891 Unspecified atrial fibrillation: Secondary | ICD-10-CM | POA: Diagnosis not present

## 2021-04-26 DIAGNOSIS — M109 Gout, unspecified: Secondary | ICD-10-CM | POA: Diagnosis not present

## 2021-04-26 DIAGNOSIS — N451 Epididymitis: Secondary | ICD-10-CM | POA: Diagnosis not present

## 2021-04-26 DIAGNOSIS — I5043 Acute on chronic combined systolic (congestive) and diastolic (congestive) heart failure: Secondary | ICD-10-CM | POA: Diagnosis not present

## 2021-04-27 DIAGNOSIS — D509 Iron deficiency anemia, unspecified: Secondary | ICD-10-CM | POA: Diagnosis not present

## 2021-04-27 DIAGNOSIS — I5043 Acute on chronic combined systolic (congestive) and diastolic (congestive) heart failure: Secondary | ICD-10-CM | POA: Diagnosis not present

## 2021-04-27 DIAGNOSIS — I4891 Unspecified atrial fibrillation: Secondary | ICD-10-CM | POA: Diagnosis not present

## 2021-04-27 DIAGNOSIS — M150 Primary generalized (osteo)arthritis: Secondary | ICD-10-CM | POA: Diagnosis not present

## 2021-04-27 DIAGNOSIS — I428 Other cardiomyopathies: Secondary | ICD-10-CM | POA: Diagnosis not present

## 2021-04-27 DIAGNOSIS — M109 Gout, unspecified: Secondary | ICD-10-CM | POA: Diagnosis not present

## 2021-04-27 DIAGNOSIS — N451 Epididymitis: Secondary | ICD-10-CM | POA: Diagnosis not present

## 2021-04-27 DIAGNOSIS — H402234 Chronic angle-closure glaucoma, bilateral, indeterminate stage: Secondary | ICD-10-CM | POA: Diagnosis not present

## 2021-04-27 DIAGNOSIS — N186 End stage renal disease: Secondary | ICD-10-CM | POA: Diagnosis not present

## 2021-04-27 DIAGNOSIS — I132 Hypertensive heart and chronic kidney disease with heart failure and with stage 5 chronic kidney disease, or end stage renal disease: Secondary | ICD-10-CM | POA: Diagnosis not present

## 2021-04-28 DIAGNOSIS — N2581 Secondary hyperparathyroidism of renal origin: Secondary | ICD-10-CM | POA: Diagnosis not present

## 2021-04-28 DIAGNOSIS — Z992 Dependence on renal dialysis: Secondary | ICD-10-CM | POA: Diagnosis not present

## 2021-04-28 DIAGNOSIS — N186 End stage renal disease: Secondary | ICD-10-CM | POA: Diagnosis not present

## 2021-04-29 DIAGNOSIS — I428 Other cardiomyopathies: Secondary | ICD-10-CM | POA: Diagnosis not present

## 2021-04-29 DIAGNOSIS — N186 End stage renal disease: Secondary | ICD-10-CM | POA: Diagnosis not present

## 2021-04-29 DIAGNOSIS — M109 Gout, unspecified: Secondary | ICD-10-CM | POA: Diagnosis not present

## 2021-04-29 DIAGNOSIS — M150 Primary generalized (osteo)arthritis: Secondary | ICD-10-CM | POA: Diagnosis not present

## 2021-04-29 DIAGNOSIS — N451 Epididymitis: Secondary | ICD-10-CM | POA: Diagnosis not present

## 2021-04-29 DIAGNOSIS — I4891 Unspecified atrial fibrillation: Secondary | ICD-10-CM | POA: Diagnosis not present

## 2021-04-29 DIAGNOSIS — I5043 Acute on chronic combined systolic (congestive) and diastolic (congestive) heart failure: Secondary | ICD-10-CM | POA: Diagnosis not present

## 2021-04-29 DIAGNOSIS — D509 Iron deficiency anemia, unspecified: Secondary | ICD-10-CM | POA: Diagnosis not present

## 2021-04-29 DIAGNOSIS — I132 Hypertensive heart and chronic kidney disease with heart failure and with stage 5 chronic kidney disease, or end stage renal disease: Secondary | ICD-10-CM | POA: Diagnosis not present

## 2021-04-30 DIAGNOSIS — N2581 Secondary hyperparathyroidism of renal origin: Secondary | ICD-10-CM | POA: Diagnosis not present

## 2021-04-30 DIAGNOSIS — N186 End stage renal disease: Secondary | ICD-10-CM | POA: Diagnosis not present

## 2021-04-30 DIAGNOSIS — Z992 Dependence on renal dialysis: Secondary | ICD-10-CM | POA: Diagnosis not present

## 2021-05-02 DIAGNOSIS — N186 End stage renal disease: Secondary | ICD-10-CM | POA: Diagnosis not present

## 2021-05-02 DIAGNOSIS — N2581 Secondary hyperparathyroidism of renal origin: Secondary | ICD-10-CM | POA: Diagnosis not present

## 2021-05-02 DIAGNOSIS — Z992 Dependence on renal dialysis: Secondary | ICD-10-CM | POA: Diagnosis not present

## 2021-05-03 ENCOUNTER — Telehealth: Payer: Self-pay | Admitting: Family

## 2021-05-03 DIAGNOSIS — B20 Human immunodeficiency virus [HIV] disease: Secondary | ICD-10-CM | POA: Diagnosis not present

## 2021-05-03 DIAGNOSIS — Z992 Dependence on renal dialysis: Secondary | ICD-10-CM | POA: Diagnosis not present

## 2021-05-03 DIAGNOSIS — N186 End stage renal disease: Secondary | ICD-10-CM | POA: Diagnosis not present

## 2021-05-03 DIAGNOSIS — N2581 Secondary hyperparathyroidism of renal origin: Secondary | ICD-10-CM | POA: Diagnosis not present

## 2021-05-03 NOTE — Telephone Encounter (Signed)
Please write rx for the 4 wheel rolling waslker with seat for 6'2" patient.   Left voice mail with verbal order for OT.

## 2021-05-03 NOTE — Telephone Encounter (Signed)
OT Verbal order need 1 week  6   4 wheel rolling walker with seat for a 6'2 Tall person  Script needed to be sent to [facility]...  Please be sure the script says it's for a Tall person otherwise he will get on for a short person    Okay to leave Leave message

## 2021-05-03 NOTE — Telephone Encounter (Signed)
Order for walker faxed to [facility]

## 2021-05-04 ENCOUNTER — Other Ambulatory Visit: Payer: Self-pay | Admitting: *Deleted

## 2021-05-04 DIAGNOSIS — I428 Other cardiomyopathies: Secondary | ICD-10-CM | POA: Diagnosis not present

## 2021-05-04 DIAGNOSIS — I4891 Unspecified atrial fibrillation: Secondary | ICD-10-CM | POA: Diagnosis not present

## 2021-05-04 DIAGNOSIS — N451 Epididymitis: Secondary | ICD-10-CM | POA: Diagnosis not present

## 2021-05-04 DIAGNOSIS — N186 End stage renal disease: Secondary | ICD-10-CM | POA: Diagnosis not present

## 2021-05-04 DIAGNOSIS — I5043 Acute on chronic combined systolic (congestive) and diastolic (congestive) heart failure: Secondary | ICD-10-CM | POA: Diagnosis not present

## 2021-05-04 DIAGNOSIS — M109 Gout, unspecified: Secondary | ICD-10-CM | POA: Diagnosis not present

## 2021-05-04 DIAGNOSIS — I132 Hypertensive heart and chronic kidney disease with heart failure and with stage 5 chronic kidney disease, or end stage renal disease: Secondary | ICD-10-CM | POA: Diagnosis not present

## 2021-05-04 DIAGNOSIS — I509 Heart failure, unspecified: Secondary | ICD-10-CM

## 2021-05-04 DIAGNOSIS — M150 Primary generalized (osteo)arthritis: Secondary | ICD-10-CM | POA: Diagnosis not present

## 2021-05-04 DIAGNOSIS — R269 Unspecified abnormalities of gait and mobility: Secondary | ICD-10-CM

## 2021-05-04 DIAGNOSIS — M1612 Unilateral primary osteoarthritis, left hip: Secondary | ICD-10-CM

## 2021-05-04 DIAGNOSIS — D509 Iron deficiency anemia, unspecified: Secondary | ICD-10-CM | POA: Diagnosis not present

## 2021-05-05 DIAGNOSIS — N2581 Secondary hyperparathyroidism of renal origin: Secondary | ICD-10-CM | POA: Diagnosis not present

## 2021-05-05 DIAGNOSIS — R269 Unspecified abnormalities of gait and mobility: Secondary | ICD-10-CM | POA: Diagnosis not present

## 2021-05-05 DIAGNOSIS — N186 End stage renal disease: Secondary | ICD-10-CM | POA: Diagnosis not present

## 2021-05-05 DIAGNOSIS — Z992 Dependence on renal dialysis: Secondary | ICD-10-CM | POA: Diagnosis not present

## 2021-05-06 ENCOUNTER — Other Ambulatory Visit: Payer: Self-pay | Admitting: Family

## 2021-05-07 DIAGNOSIS — Z992 Dependence on renal dialysis: Secondary | ICD-10-CM | POA: Diagnosis not present

## 2021-05-07 DIAGNOSIS — N2581 Secondary hyperparathyroidism of renal origin: Secondary | ICD-10-CM | POA: Diagnosis not present

## 2021-05-07 DIAGNOSIS — N186 End stage renal disease: Secondary | ICD-10-CM | POA: Diagnosis not present

## 2021-05-09 DIAGNOSIS — D509 Iron deficiency anemia, unspecified: Secondary | ICD-10-CM | POA: Diagnosis not present

## 2021-05-09 DIAGNOSIS — N451 Epididymitis: Secondary | ICD-10-CM | POA: Diagnosis not present

## 2021-05-09 DIAGNOSIS — N2581 Secondary hyperparathyroidism of renal origin: Secondary | ICD-10-CM | POA: Diagnosis not present

## 2021-05-09 DIAGNOSIS — I132 Hypertensive heart and chronic kidney disease with heart failure and with stage 5 chronic kidney disease, or end stage renal disease: Secondary | ICD-10-CM | POA: Diagnosis not present

## 2021-05-09 DIAGNOSIS — N186 End stage renal disease: Secondary | ICD-10-CM | POA: Diagnosis not present

## 2021-05-09 DIAGNOSIS — I428 Other cardiomyopathies: Secondary | ICD-10-CM | POA: Diagnosis not present

## 2021-05-09 DIAGNOSIS — Z992 Dependence on renal dialysis: Secondary | ICD-10-CM | POA: Diagnosis not present

## 2021-05-09 DIAGNOSIS — M150 Primary generalized (osteo)arthritis: Secondary | ICD-10-CM | POA: Diagnosis not present

## 2021-05-09 DIAGNOSIS — M109 Gout, unspecified: Secondary | ICD-10-CM | POA: Diagnosis not present

## 2021-05-09 DIAGNOSIS — I5043 Acute on chronic combined systolic (congestive) and diastolic (congestive) heart failure: Secondary | ICD-10-CM | POA: Diagnosis not present

## 2021-05-09 DIAGNOSIS — I4891 Unspecified atrial fibrillation: Secondary | ICD-10-CM | POA: Diagnosis not present

## 2021-05-10 DIAGNOSIS — N2581 Secondary hyperparathyroidism of renal origin: Secondary | ICD-10-CM | POA: Diagnosis not present

## 2021-05-10 DIAGNOSIS — Z992 Dependence on renal dialysis: Secondary | ICD-10-CM | POA: Diagnosis not present

## 2021-05-10 DIAGNOSIS — N186 End stage renal disease: Secondary | ICD-10-CM | POA: Diagnosis not present

## 2021-05-11 DIAGNOSIS — M150 Primary generalized (osteo)arthritis: Secondary | ICD-10-CM | POA: Diagnosis not present

## 2021-05-11 DIAGNOSIS — M109 Gout, unspecified: Secondary | ICD-10-CM | POA: Diagnosis not present

## 2021-05-11 DIAGNOSIS — I5043 Acute on chronic combined systolic (congestive) and diastolic (congestive) heart failure: Secondary | ICD-10-CM | POA: Diagnosis not present

## 2021-05-11 DIAGNOSIS — N451 Epididymitis: Secondary | ICD-10-CM | POA: Diagnosis not present

## 2021-05-11 DIAGNOSIS — D509 Iron deficiency anemia, unspecified: Secondary | ICD-10-CM | POA: Diagnosis not present

## 2021-05-11 DIAGNOSIS — I132 Hypertensive heart and chronic kidney disease with heart failure and with stage 5 chronic kidney disease, or end stage renal disease: Secondary | ICD-10-CM | POA: Diagnosis not present

## 2021-05-11 DIAGNOSIS — I4891 Unspecified atrial fibrillation: Secondary | ICD-10-CM | POA: Diagnosis not present

## 2021-05-11 DIAGNOSIS — N186 End stage renal disease: Secondary | ICD-10-CM | POA: Diagnosis not present

## 2021-05-11 DIAGNOSIS — I428 Other cardiomyopathies: Secondary | ICD-10-CM | POA: Diagnosis not present

## 2021-05-12 DIAGNOSIS — N186 End stage renal disease: Secondary | ICD-10-CM | POA: Diagnosis not present

## 2021-05-12 DIAGNOSIS — N2581 Secondary hyperparathyroidism of renal origin: Secondary | ICD-10-CM | POA: Diagnosis not present

## 2021-05-12 DIAGNOSIS — Z992 Dependence on renal dialysis: Secondary | ICD-10-CM | POA: Diagnosis not present

## 2021-05-13 DIAGNOSIS — I132 Hypertensive heart and chronic kidney disease with heart failure and with stage 5 chronic kidney disease, or end stage renal disease: Secondary | ICD-10-CM | POA: Diagnosis not present

## 2021-05-13 DIAGNOSIS — N186 End stage renal disease: Secondary | ICD-10-CM | POA: Diagnosis not present

## 2021-05-13 DIAGNOSIS — M150 Primary generalized (osteo)arthritis: Secondary | ICD-10-CM | POA: Diagnosis not present

## 2021-05-13 DIAGNOSIS — M109 Gout, unspecified: Secondary | ICD-10-CM | POA: Diagnosis not present

## 2021-05-13 DIAGNOSIS — N451 Epididymitis: Secondary | ICD-10-CM | POA: Diagnosis not present

## 2021-05-13 DIAGNOSIS — I5043 Acute on chronic combined systolic (congestive) and diastolic (congestive) heart failure: Secondary | ICD-10-CM | POA: Diagnosis not present

## 2021-05-13 DIAGNOSIS — D509 Iron deficiency anemia, unspecified: Secondary | ICD-10-CM | POA: Diagnosis not present

## 2021-05-13 DIAGNOSIS — I428 Other cardiomyopathies: Secondary | ICD-10-CM | POA: Diagnosis not present

## 2021-05-13 DIAGNOSIS — I4891 Unspecified atrial fibrillation: Secondary | ICD-10-CM | POA: Diagnosis not present

## 2021-05-14 DIAGNOSIS — N2581 Secondary hyperparathyroidism of renal origin: Secondary | ICD-10-CM | POA: Diagnosis not present

## 2021-05-14 DIAGNOSIS — N186 End stage renal disease: Secondary | ICD-10-CM | POA: Diagnosis not present

## 2021-05-14 DIAGNOSIS — Z992 Dependence on renal dialysis: Secondary | ICD-10-CM | POA: Diagnosis not present

## 2021-05-16 DIAGNOSIS — N186 End stage renal disease: Secondary | ICD-10-CM | POA: Diagnosis not present

## 2021-05-16 DIAGNOSIS — N2581 Secondary hyperparathyroidism of renal origin: Secondary | ICD-10-CM | POA: Diagnosis not present

## 2021-05-16 DIAGNOSIS — Z992 Dependence on renal dialysis: Secondary | ICD-10-CM | POA: Diagnosis not present

## 2021-05-17 ENCOUNTER — Other Ambulatory Visit: Payer: Self-pay | Admitting: Family Medicine

## 2021-05-17 DIAGNOSIS — N2581 Secondary hyperparathyroidism of renal origin: Secondary | ICD-10-CM | POA: Diagnosis not present

## 2021-05-17 DIAGNOSIS — Z992 Dependence on renal dialysis: Secondary | ICD-10-CM | POA: Diagnosis not present

## 2021-05-17 DIAGNOSIS — N186 End stage renal disease: Secondary | ICD-10-CM | POA: Diagnosis not present

## 2021-05-18 ENCOUNTER — Ambulatory Visit (INDEPENDENT_AMBULATORY_CARE_PROVIDER_SITE_OTHER): Payer: Medicare HMO | Admitting: Physician Assistant

## 2021-05-18 ENCOUNTER — Encounter: Payer: Self-pay | Admitting: Physician Assistant

## 2021-05-18 DIAGNOSIS — D509 Iron deficiency anemia, unspecified: Secondary | ICD-10-CM | POA: Diagnosis not present

## 2021-05-18 DIAGNOSIS — N186 End stage renal disease: Secondary | ICD-10-CM | POA: Diagnosis not present

## 2021-05-18 DIAGNOSIS — I428 Other cardiomyopathies: Secondary | ICD-10-CM | POA: Diagnosis not present

## 2021-05-18 DIAGNOSIS — I5043 Acute on chronic combined systolic (congestive) and diastolic (congestive) heart failure: Secondary | ICD-10-CM | POA: Diagnosis not present

## 2021-05-18 DIAGNOSIS — M1612 Unilateral primary osteoarthritis, left hip: Secondary | ICD-10-CM | POA: Diagnosis not present

## 2021-05-18 DIAGNOSIS — M109 Gout, unspecified: Secondary | ICD-10-CM | POA: Diagnosis not present

## 2021-05-18 DIAGNOSIS — I4891 Unspecified atrial fibrillation: Secondary | ICD-10-CM | POA: Diagnosis not present

## 2021-05-18 DIAGNOSIS — N451 Epididymitis: Secondary | ICD-10-CM | POA: Diagnosis not present

## 2021-05-18 DIAGNOSIS — M150 Primary generalized (osteo)arthritis: Secondary | ICD-10-CM | POA: Diagnosis not present

## 2021-05-18 DIAGNOSIS — I132 Hypertensive heart and chronic kidney disease with heart failure and with stage 5 chronic kidney disease, or end stage renal disease: Secondary | ICD-10-CM | POA: Diagnosis not present

## 2021-05-18 NOTE — Progress Notes (Signed)
HPI: David [name] returns today follow-up status post left hip injection by Dr. Junius Roads under ultrasound on 04/20/2021.  He states the injection only helped for about 5 days and his pain came back is sharp pain in the right hip.  He only has pain whenever he is ambulating.  Rates his pain to be 8-9 out of 10 pain requires.  He has no pain with sitting.  He does ambulate with a walker.  Review of systems: See HPI.  Impression: Left hip osteoarthritis end-stage  Plan: Discussed with the patient and his wife is present today that not recommend any surgical intervention due to his comorbidities.  Discussed with him at length that we actually could make his quality of life worse if he were to encourage stroke, infection or cardiac event in the perioperative period.  He and his wife understand that there is really nothing else we can offer him.  Recommend he use walker to ambulate.  He can always follow-up with his future if he has any questions or concerns.

## 2021-05-19 DIAGNOSIS — N186 End stage renal disease: Secondary | ICD-10-CM | POA: Diagnosis not present

## 2021-05-19 DIAGNOSIS — N2581 Secondary hyperparathyroidism of renal origin: Secondary | ICD-10-CM | POA: Diagnosis not present

## 2021-05-19 DIAGNOSIS — Z992 Dependence on renal dialysis: Secondary | ICD-10-CM | POA: Diagnosis not present

## 2021-05-20 DIAGNOSIS — N451 Epididymitis: Secondary | ICD-10-CM | POA: Diagnosis not present

## 2021-05-20 DIAGNOSIS — I4891 Unspecified atrial fibrillation: Secondary | ICD-10-CM | POA: Diagnosis not present

## 2021-05-20 DIAGNOSIS — M150 Primary generalized (osteo)arthritis: Secondary | ICD-10-CM | POA: Diagnosis not present

## 2021-05-20 DIAGNOSIS — I5043 Acute on chronic combined systolic (congestive) and diastolic (congestive) heart failure: Secondary | ICD-10-CM | POA: Diagnosis not present

## 2021-05-20 DIAGNOSIS — I428 Other cardiomyopathies: Secondary | ICD-10-CM | POA: Diagnosis not present

## 2021-05-20 DIAGNOSIS — M109 Gout, unspecified: Secondary | ICD-10-CM | POA: Diagnosis not present

## 2021-05-20 DIAGNOSIS — N186 End stage renal disease: Secondary | ICD-10-CM | POA: Diagnosis not present

## 2021-05-20 DIAGNOSIS — I132 Hypertensive heart and chronic kidney disease with heart failure and with stage 5 chronic kidney disease, or end stage renal disease: Secondary | ICD-10-CM | POA: Diagnosis not present

## 2021-05-20 DIAGNOSIS — D509 Iron deficiency anemia, unspecified: Secondary | ICD-10-CM | POA: Diagnosis not present

## 2021-05-21 DIAGNOSIS — Z992 Dependence on renal dialysis: Secondary | ICD-10-CM | POA: Diagnosis not present

## 2021-05-21 DIAGNOSIS — N2581 Secondary hyperparathyroidism of renal origin: Secondary | ICD-10-CM | POA: Diagnosis not present

## 2021-05-21 DIAGNOSIS — N186 End stage renal disease: Secondary | ICD-10-CM | POA: Diagnosis not present

## 2021-05-23 DIAGNOSIS — I5043 Acute on chronic combined systolic (congestive) and diastolic (congestive) heart failure: Secondary | ICD-10-CM | POA: Diagnosis not present

## 2021-05-23 DIAGNOSIS — D509 Iron deficiency anemia, unspecified: Secondary | ICD-10-CM | POA: Diagnosis not present

## 2021-05-23 DIAGNOSIS — I4891 Unspecified atrial fibrillation: Secondary | ICD-10-CM | POA: Diagnosis not present

## 2021-05-23 DIAGNOSIS — I428 Other cardiomyopathies: Secondary | ICD-10-CM | POA: Diagnosis not present

## 2021-05-23 DIAGNOSIS — Z992 Dependence on renal dialysis: Secondary | ICD-10-CM | POA: Diagnosis not present

## 2021-05-23 DIAGNOSIS — N2581 Secondary hyperparathyroidism of renal origin: Secondary | ICD-10-CM | POA: Diagnosis not present

## 2021-05-23 DIAGNOSIS — N186 End stage renal disease: Secondary | ICD-10-CM | POA: Diagnosis not present

## 2021-05-23 DIAGNOSIS — M150 Primary generalized (osteo)arthritis: Secondary | ICD-10-CM | POA: Diagnosis not present

## 2021-05-23 DIAGNOSIS — M109 Gout, unspecified: Secondary | ICD-10-CM | POA: Diagnosis not present

## 2021-05-23 DIAGNOSIS — I132 Hypertensive heart and chronic kidney disease with heart failure and with stage 5 chronic kidney disease, or end stage renal disease: Secondary | ICD-10-CM | POA: Diagnosis not present

## 2021-05-23 DIAGNOSIS — N451 Epididymitis: Secondary | ICD-10-CM | POA: Diagnosis not present

## 2021-05-24 DIAGNOSIS — N186 End stage renal disease: Secondary | ICD-10-CM | POA: Diagnosis not present

## 2021-05-24 DIAGNOSIS — N2581 Secondary hyperparathyroidism of renal origin: Secondary | ICD-10-CM | POA: Diagnosis not present

## 2021-05-24 DIAGNOSIS — Z992 Dependence on renal dialysis: Secondary | ICD-10-CM | POA: Diagnosis not present

## 2021-05-25 ENCOUNTER — Telehealth: Payer: Self-pay | Admitting: Family

## 2021-05-25 DIAGNOSIS — I5043 Acute on chronic combined systolic (congestive) and diastolic (congestive) heart failure: Secondary | ICD-10-CM | POA: Diagnosis not present

## 2021-05-25 DIAGNOSIS — I428 Other cardiomyopathies: Secondary | ICD-10-CM | POA: Diagnosis not present

## 2021-05-25 DIAGNOSIS — N186 End stage renal disease: Secondary | ICD-10-CM | POA: Diagnosis not present

## 2021-05-25 DIAGNOSIS — I132 Hypertensive heart and chronic kidney disease with heart failure and with stage 5 chronic kidney disease, or end stage renal disease: Secondary | ICD-10-CM | POA: Diagnosis not present

## 2021-05-25 DIAGNOSIS — I4891 Unspecified atrial fibrillation: Secondary | ICD-10-CM | POA: Diagnosis not present

## 2021-05-25 DIAGNOSIS — N451 Epididymitis: Secondary | ICD-10-CM | POA: Diagnosis not present

## 2021-05-25 DIAGNOSIS — D509 Iron deficiency anemia, unspecified: Secondary | ICD-10-CM | POA: Diagnosis not present

## 2021-05-25 DIAGNOSIS — M150 Primary generalized (osteo)arthritis: Secondary | ICD-10-CM | POA: Diagnosis not present

## 2021-05-25 DIAGNOSIS — M109 Gout, unspecified: Secondary | ICD-10-CM | POA: Diagnosis not present

## 2021-05-25 NOTE — Telephone Encounter (Signed)
David [name] from [facility] [phone] called continue TP 1 week 3 starting June 27th   Please advice

## 2021-05-25 NOTE — Telephone Encounter (Signed)
Lvm for physical therapist to call  back for verbal orders

## 2021-05-26 DIAGNOSIS — N186 End stage renal disease: Secondary | ICD-10-CM | POA: Diagnosis not present

## 2021-05-26 DIAGNOSIS — Z992 Dependence on renal dialysis: Secondary | ICD-10-CM | POA: Diagnosis not present

## 2021-05-26 DIAGNOSIS — N2581 Secondary hyperparathyroidism of renal origin: Secondary | ICD-10-CM | POA: Diagnosis not present

## 2021-05-28 DIAGNOSIS — Z992 Dependence on renal dialysis: Secondary | ICD-10-CM | POA: Diagnosis not present

## 2021-05-28 DIAGNOSIS — N2581 Secondary hyperparathyroidism of renal origin: Secondary | ICD-10-CM | POA: Diagnosis not present

## 2021-05-28 DIAGNOSIS — N186 End stage renal disease: Secondary | ICD-10-CM | POA: Diagnosis not present

## 2021-05-30 DIAGNOSIS — Z992 Dependence on renal dialysis: Secondary | ICD-10-CM | POA: Diagnosis not present

## 2021-05-30 DIAGNOSIS — N2581 Secondary hyperparathyroidism of renal origin: Secondary | ICD-10-CM | POA: Diagnosis not present

## 2021-05-30 DIAGNOSIS — N186 End stage renal disease: Secondary | ICD-10-CM | POA: Diagnosis not present

## 2021-05-31 DIAGNOSIS — N2581 Secondary hyperparathyroidism of renal origin: Secondary | ICD-10-CM | POA: Diagnosis not present

## 2021-05-31 DIAGNOSIS — N186 End stage renal disease: Secondary | ICD-10-CM | POA: Diagnosis not present

## 2021-05-31 DIAGNOSIS — Z992 Dependence on renal dialysis: Secondary | ICD-10-CM | POA: Diagnosis not present

## 2021-06-02 DIAGNOSIS — N186 End stage renal disease: Secondary | ICD-10-CM | POA: Diagnosis not present

## 2021-06-02 DIAGNOSIS — Z992 Dependence on renal dialysis: Secondary | ICD-10-CM | POA: Diagnosis not present

## 2021-06-02 DIAGNOSIS — N2581 Secondary hyperparathyroidism of renal origin: Secondary | ICD-10-CM | POA: Diagnosis not present

## 2021-06-02 DIAGNOSIS — B20 Human immunodeficiency virus [HIV] disease: Secondary | ICD-10-CM | POA: Diagnosis not present

## 2021-06-03 ENCOUNTER — Telehealth: Payer: Self-pay | Admitting: Family

## 2021-06-03 DIAGNOSIS — N451 Epididymitis: Secondary | ICD-10-CM | POA: Diagnosis not present

## 2021-06-03 DIAGNOSIS — Z992 Dependence on renal dialysis: Secondary | ICD-10-CM | POA: Diagnosis not present

## 2021-06-03 DIAGNOSIS — I5043 Acute on chronic combined systolic (congestive) and diastolic (congestive) heart failure: Secondary | ICD-10-CM | POA: Diagnosis not present

## 2021-06-03 DIAGNOSIS — I4891 Unspecified atrial fibrillation: Secondary | ICD-10-CM | POA: Diagnosis not present

## 2021-06-03 DIAGNOSIS — B20 Human immunodeficiency virus [HIV] disease: Secondary | ICD-10-CM | POA: Diagnosis not present

## 2021-06-03 DIAGNOSIS — M150 Primary generalized (osteo)arthritis: Secondary | ICD-10-CM | POA: Diagnosis not present

## 2021-06-03 DIAGNOSIS — N186 End stage renal disease: Secondary | ICD-10-CM | POA: Diagnosis not present

## 2021-06-03 DIAGNOSIS — D509 Iron deficiency anemia, unspecified: Secondary | ICD-10-CM | POA: Diagnosis not present

## 2021-06-03 DIAGNOSIS — N251 Nephrogenic diabetes insipidus: Secondary | ICD-10-CM | POA: Diagnosis not present

## 2021-06-03 DIAGNOSIS — I132 Hypertensive heart and chronic kidney disease with heart failure and with stage 5 chronic kidney disease, or end stage renal disease: Secondary | ICD-10-CM | POA: Diagnosis not present

## 2021-06-03 DIAGNOSIS — M109 Gout, unspecified: Secondary | ICD-10-CM | POA: Diagnosis not present

## 2021-06-03 DIAGNOSIS — I428 Other cardiomyopathies: Secondary | ICD-10-CM | POA: Diagnosis not present

## 2021-06-03 DIAGNOSIS — N2581 Secondary hyperparathyroidism of renal origin: Secondary | ICD-10-CM | POA: Diagnosis not present

## 2021-06-03 NOTE — Telephone Encounter (Signed)
Patient is being discharged from OT today. He has met all his Goals

## 2021-06-04 DIAGNOSIS — Z992 Dependence on renal dialysis: Secondary | ICD-10-CM | POA: Diagnosis not present

## 2021-06-04 DIAGNOSIS — N2581 Secondary hyperparathyroidism of renal origin: Secondary | ICD-10-CM | POA: Diagnosis not present

## 2021-06-04 DIAGNOSIS — N186 End stage renal disease: Secondary | ICD-10-CM | POA: Diagnosis not present

## 2021-06-06 DIAGNOSIS — Z992 Dependence on renal dialysis: Secondary | ICD-10-CM | POA: Diagnosis not present

## 2021-06-06 DIAGNOSIS — N186 End stage renal disease: Secondary | ICD-10-CM | POA: Diagnosis not present

## 2021-06-06 DIAGNOSIS — N2581 Secondary hyperparathyroidism of renal origin: Secondary | ICD-10-CM | POA: Diagnosis not present

## 2021-06-07 DIAGNOSIS — N2581 Secondary hyperparathyroidism of renal origin: Secondary | ICD-10-CM | POA: Diagnosis not present

## 2021-06-07 DIAGNOSIS — N186 End stage renal disease: Secondary | ICD-10-CM | POA: Diagnosis not present

## 2021-06-07 DIAGNOSIS — Z992 Dependence on renal dialysis: Secondary | ICD-10-CM | POA: Diagnosis not present

## 2021-06-08 ENCOUNTER — Telehealth: Payer: Self-pay | Admitting: Family

## 2021-06-08 DIAGNOSIS — I5043 Acute on chronic combined systolic (congestive) and diastolic (congestive) heart failure: Secondary | ICD-10-CM | POA: Diagnosis not present

## 2021-06-08 DIAGNOSIS — D509 Iron deficiency anemia, unspecified: Secondary | ICD-10-CM | POA: Diagnosis not present

## 2021-06-08 DIAGNOSIS — N451 Epididymitis: Secondary | ICD-10-CM | POA: Diagnosis not present

## 2021-06-08 DIAGNOSIS — M150 Primary generalized (osteo)arthritis: Secondary | ICD-10-CM | POA: Diagnosis not present

## 2021-06-08 DIAGNOSIS — I428 Other cardiomyopathies: Secondary | ICD-10-CM | POA: Diagnosis not present

## 2021-06-08 DIAGNOSIS — I132 Hypertensive heart and chronic kidney disease with heart failure and with stage 5 chronic kidney disease, or end stage renal disease: Secondary | ICD-10-CM | POA: Diagnosis not present

## 2021-06-08 DIAGNOSIS — N186 End stage renal disease: Secondary | ICD-10-CM | POA: Diagnosis not present

## 2021-06-08 DIAGNOSIS — M109 Gout, unspecified: Secondary | ICD-10-CM | POA: Diagnosis not present

## 2021-06-08 DIAGNOSIS — I4891 Unspecified atrial fibrillation: Secondary | ICD-10-CM | POA: Diagnosis not present

## 2021-06-08 NOTE — Telephone Encounter (Signed)
Caller: Galina ([place]) Call back # [phone]  Would like to report patient b/p seating 170/90, after 5 min of laying down- supine position 160/80

## 2021-06-09 DIAGNOSIS — N186 End stage renal disease: Secondary | ICD-10-CM | POA: Diagnosis not present

## 2021-06-09 DIAGNOSIS — N2581 Secondary hyperparathyroidism of renal origin: Secondary | ICD-10-CM | POA: Diagnosis not present

## 2021-06-09 DIAGNOSIS — Z992 Dependence on renal dialysis: Secondary | ICD-10-CM | POA: Diagnosis not present

## 2021-06-09 NOTE — Telephone Encounter (Signed)
BP Readings from Last 3 Encounters:  04/12/21 121/64  04/06/21 130/76  02/22/21 (!) 108/56   BP was much lower at his recent visit and is being monitored regularly at dialysis.  No changes at this time.

## 2021-06-10 ENCOUNTER — Inpatient Hospital Stay (HOSPITAL_COMMUNITY)
Admission: EM | Admit: 2021-06-10 | Discharge: 2021-06-17 | DRG: 480 | Disposition: A | Discharge status: Skilled Nursing Facility | Payer: Medicare HMO | Attending: Internal Medicine | Admitting: Internal Medicine

## 2021-06-10 ENCOUNTER — Emergency Department (HOSPITAL_COMMUNITY): Payer: Medicare HMO

## 2021-06-10 ENCOUNTER — Other Ambulatory Visit: Payer: Self-pay

## 2021-06-10 DIAGNOSIS — S72011A Unspecified intracapsular fracture of right femur, initial encounter for closed fracture: Principal | ICD-10-CM | POA: Diagnosis present

## 2021-06-10 DIAGNOSIS — I132 Hypertensive heart and chronic kidney disease with heart failure and with stage 5 chronic kidney disease, or end stage renal disease: Secondary | ICD-10-CM | POA: Diagnosis present

## 2021-06-10 DIAGNOSIS — Z992 Dependence on renal dialysis: Secondary | ICD-10-CM | POA: Diagnosis not present

## 2021-06-10 DIAGNOSIS — N186 End stage renal disease: Secondary | ICD-10-CM | POA: Diagnosis present

## 2021-06-10 DIAGNOSIS — W1830XA Fall on same level, unspecified, initial encounter: Secondary | ICD-10-CM | POA: Diagnosis present

## 2021-06-10 DIAGNOSIS — M109 Gout, unspecified: Secondary | ICD-10-CM | POA: Diagnosis present

## 2021-06-10 DIAGNOSIS — D6859 Other primary thrombophilia: Secondary | ICD-10-CM | POA: Diagnosis not present

## 2021-06-10 DIAGNOSIS — Z419 Encounter for procedure for purposes other than remedying health state, unspecified: Secondary | ICD-10-CM

## 2021-06-10 DIAGNOSIS — Z888 Allergy status to other drugs, medicaments and biological substances status: Secondary | ICD-10-CM

## 2021-06-10 DIAGNOSIS — I4891 Unspecified atrial fibrillation: Secondary | ICD-10-CM | POA: Diagnosis not present

## 2021-06-10 DIAGNOSIS — T1490XA Injury, unspecified, initial encounter: Secondary | ICD-10-CM

## 2021-06-10 DIAGNOSIS — D509 Iron deficiency anemia, unspecified: Secondary | ICD-10-CM | POA: Diagnosis not present

## 2021-06-10 DIAGNOSIS — R0902 Hypoxemia: Secondary | ICD-10-CM | POA: Diagnosis not present

## 2021-06-10 DIAGNOSIS — Z515 Encounter for palliative care: Secondary | ICD-10-CM | POA: Diagnosis not present

## 2021-06-10 DIAGNOSIS — Z8673 Personal history of transient ischemic attack (TIA), and cerebral infarction without residual deficits: Secondary | ICD-10-CM

## 2021-06-10 DIAGNOSIS — Y92009 Unspecified place in unspecified non-institutional (private) residence as the place of occurrence of the external cause: Secondary | ICD-10-CM | POA: Diagnosis not present

## 2021-06-10 DIAGNOSIS — Z7901 Long term (current) use of anticoagulants: Secondary | ICD-10-CM | POA: Diagnosis not present

## 2021-06-10 DIAGNOSIS — Z8781 Personal history of (healed) traumatic fracture: Secondary | ICD-10-CM | POA: Diagnosis not present

## 2021-06-10 DIAGNOSIS — R9431 Abnormal electrocardiogram [ECG] [EKG]: Secondary | ICD-10-CM | POA: Diagnosis not present

## 2021-06-10 DIAGNOSIS — I953 Hypotension of hemodialysis: Secondary | ICD-10-CM | POA: Diagnosis not present

## 2021-06-10 DIAGNOSIS — G4733 Obstructive sleep apnea (adult) (pediatric): Secondary | ICD-10-CM | POA: Diagnosis present

## 2021-06-10 DIAGNOSIS — I120 Hypertensive chronic kidney disease with stage 5 chronic kidney disease or end stage renal disease: Secondary | ICD-10-CM | POA: Diagnosis not present

## 2021-06-10 DIAGNOSIS — M25572 Pain in left ankle and joints of left foot: Secondary | ICD-10-CM | POA: Diagnosis not present

## 2021-06-10 DIAGNOSIS — S72001A Fracture of unspecified part of neck of right femur, initial encounter for closed fracture: Secondary | ICD-10-CM | POA: Diagnosis not present

## 2021-06-10 DIAGNOSIS — Z87891 Personal history of nicotine dependence: Secondary | ICD-10-CM

## 2021-06-10 DIAGNOSIS — K219 Gastro-esophageal reflux disease without esophagitis: Secondary | ICD-10-CM | POA: Diagnosis not present

## 2021-06-10 DIAGNOSIS — Z8249 Family history of ischemic heart disease and other diseases of the circulatory system: Secondary | ICD-10-CM

## 2021-06-10 DIAGNOSIS — L89892 Pressure ulcer of other site, stage 2: Secondary | ICD-10-CM | POA: Diagnosis present

## 2021-06-10 DIAGNOSIS — N2581 Secondary hyperparathyroidism of renal origin: Secondary | ICD-10-CM | POA: Diagnosis not present

## 2021-06-10 DIAGNOSIS — Z79899 Other long term (current) drug therapy: Secondary | ICD-10-CM

## 2021-06-10 DIAGNOSIS — I5022 Chronic systolic (congestive) heart failure: Secondary | ICD-10-CM | POA: Diagnosis not present

## 2021-06-10 DIAGNOSIS — Z8546 Personal history of malignant neoplasm of prostate: Secondary | ICD-10-CM

## 2021-06-10 DIAGNOSIS — I2510 Atherosclerotic heart disease of native coronary artery without angina pectoris: Secondary | ICD-10-CM | POA: Diagnosis present

## 2021-06-10 DIAGNOSIS — I5042 Chronic combined systolic (congestive) and diastolic (congestive) heart failure: Secondary | ICD-10-CM | POA: Diagnosis not present

## 2021-06-10 DIAGNOSIS — Z21 Asymptomatic human immunodeficiency virus [HIV] infection status: Secondary | ICD-10-CM | POA: Diagnosis present

## 2021-06-10 DIAGNOSIS — S72009A Fracture of unspecified part of neck of unspecified femur, initial encounter for closed fracture: Secondary | ICD-10-CM | POA: Diagnosis not present

## 2021-06-10 DIAGNOSIS — Z743 Need for continuous supervision: Secondary | ICD-10-CM | POA: Diagnosis not present

## 2021-06-10 DIAGNOSIS — I517 Cardiomegaly: Secondary | ICD-10-CM | POA: Diagnosis not present

## 2021-06-10 DIAGNOSIS — E785 Hyperlipidemia, unspecified: Secondary | ICD-10-CM | POA: Diagnosis present

## 2021-06-10 DIAGNOSIS — B20 Human immunodeficiency virus [HIV] disease: Secondary | ICD-10-CM | POA: Diagnosis not present

## 2021-06-10 DIAGNOSIS — I9589 Other hypotension: Secondary | ICD-10-CM | POA: Diagnosis not present

## 2021-06-10 DIAGNOSIS — I480 Paroxysmal atrial fibrillation: Secondary | ICD-10-CM | POA: Diagnosis present

## 2021-06-10 DIAGNOSIS — D631 Anemia in chronic kidney disease: Secondary | ICD-10-CM | POA: Diagnosis present

## 2021-06-10 DIAGNOSIS — M25551 Pain in right hip: Secondary | ICD-10-CM | POA: Diagnosis not present

## 2021-06-10 DIAGNOSIS — Z7189 Other specified counseling: Secondary | ICD-10-CM | POA: Diagnosis not present

## 2021-06-10 DIAGNOSIS — L89312 Pressure ulcer of right buttock, stage 2: Secondary | ICD-10-CM | POA: Diagnosis present

## 2021-06-10 DIAGNOSIS — I5043 Acute on chronic combined systolic (congestive) and diastolic (congestive) heart failure: Secondary | ICD-10-CM | POA: Diagnosis not present

## 2021-06-10 DIAGNOSIS — Z20822 Contact with and (suspected) exposure to covid-19: Secondary | ICD-10-CM | POA: Diagnosis not present

## 2021-06-10 DIAGNOSIS — W19XXXA Unspecified fall, initial encounter: Secondary | ICD-10-CM | POA: Diagnosis not present

## 2021-06-10 DIAGNOSIS — H409 Unspecified glaucoma: Secondary | ICD-10-CM | POA: Diagnosis present

## 2021-06-10 DIAGNOSIS — I428 Other cardiomyopathies: Secondary | ICD-10-CM | POA: Diagnosis present

## 2021-06-10 DIAGNOSIS — N185 Chronic kidney disease, stage 5: Secondary | ICD-10-CM | POA: Diagnosis not present

## 2021-06-10 DIAGNOSIS — I639 Cerebral infarction, unspecified: Secondary | ICD-10-CM | POA: Diagnosis not present

## 2021-06-10 DIAGNOSIS — G8929 Other chronic pain: Secondary | ICD-10-CM | POA: Diagnosis not present

## 2021-06-10 DIAGNOSIS — Z8616 Personal history of COVID-19: Secondary | ICD-10-CM

## 2021-06-10 DIAGNOSIS — Z8674 Personal history of sudden cardiac arrest: Secondary | ICD-10-CM

## 2021-06-10 DIAGNOSIS — Z8349 Family history of other endocrine, nutritional and metabolic diseases: Secondary | ICD-10-CM

## 2021-06-10 DIAGNOSIS — Z833 Family history of diabetes mellitus: Secondary | ICD-10-CM

## 2021-06-10 DIAGNOSIS — M84459D Pathological fracture, hip, unspecified, subsequent encounter for fracture with routine healing: Secondary | ICD-10-CM | POA: Diagnosis not present

## 2021-06-10 DIAGNOSIS — I10 Essential (primary) hypertension: Secondary | ICD-10-CM | POA: Diagnosis not present

## 2021-06-10 DIAGNOSIS — R5381 Other malaise: Secondary | ICD-10-CM | POA: Diagnosis not present

## 2021-06-10 DIAGNOSIS — C61 Malignant neoplasm of prostate: Secondary | ICD-10-CM | POA: Diagnosis not present

## 2021-06-10 DIAGNOSIS — R110 Nausea: Secondary | ICD-10-CM | POA: Diagnosis not present

## 2021-06-10 DIAGNOSIS — D696 Thrombocytopenia, unspecified: Secondary | ICD-10-CM | POA: Diagnosis not present

## 2021-06-10 LAB — CBC WITH DIFFERENTIAL/PLATELET
Abs Immature Granulocytes: 0.02 10*3/uL (ref 0.00–0.07)
Basophils Absolute: 0 10*3/uL (ref 0.0–0.1)
Basophils Relative: 0 %
Eosinophils Absolute: 0.2 10*3/uL (ref 0.0–0.5)
Eosinophils Relative: 3 %
HCT: 32.7 % — ABNORMAL LOW (ref 39.0–52.0)
Hemoglobin: 10.7 g/dL — ABNORMAL LOW (ref 13.0–17.0)
Immature Granulocytes: 0 %
Lymphocytes Relative: 10 %
Lymphs Abs: 0.8 10*3/uL (ref 0.7–4.0)
MCH: 36 pg — ABNORMAL HIGH (ref 26.0–34.0)
MCHC: 32.7 g/dL (ref 30.0–36.0)
MCV: 110.1 fL — ABNORMAL HIGH (ref 80.0–100.0)
Monocytes Absolute: 0.5 10*3/uL (ref 0.1–1.0)
Monocytes Relative: 6 %
Neutro Abs: 6.1 10*3/uL (ref 1.7–7.7)
Neutrophils Relative %: 81 %
Platelets: 160 10*3/uL (ref 150–400)
RBC: 2.97 MIL/uL — ABNORMAL LOW (ref 4.22–5.81)
RDW: 16.6 % — ABNORMAL HIGH (ref 11.5–15.5)
WBC: 7.6 10*3/uL (ref 4.0–10.5)
nRBC: 0 % (ref 0.0–0.2)

## 2021-06-10 LAB — PROTIME-INR
INR: 1.3 — ABNORMAL HIGH (ref 0.8–1.2)
Prothrombin Time: 16.2 seconds — ABNORMAL HIGH (ref 11.4–15.2)

## 2021-06-10 LAB — BASIC METABOLIC PANEL
Anion gap: 14 (ref 5–15)
BUN: 29 mg/dL — ABNORMAL HIGH (ref 8–23)
CO2: 31 mmol/L (ref 22–32)
Calcium: 9.3 mg/dL (ref 8.9–10.3)
Chloride: 92 mmol/L — ABNORMAL LOW (ref 98–111)
Creatinine, Ser: 7.37 mg/dL — ABNORMAL HIGH (ref 0.61–1.24)
GFR, Estimated: 7 mL/min — ABNORMAL LOW (ref >60)
Glucose, Bld: 134 mg/dL — ABNORMAL HIGH (ref 70–99)
Potassium: 3.7 mmol/L (ref 3.5–5.1)
Sodium: 137 mmol/L (ref 135–145)

## 2021-06-10 LAB — TYPE AND SCREEN
ABO/RH(D): O POS
Antibody Screen: NEGATIVE

## 2021-06-10 MED ORDER — FENTANYL CITRATE (PF) 100 MCG/2ML IJ SOLN
50.0000 ug | INTRAMUSCULAR | Status: AC | PRN
  Administered 2021-06-10 – 2021-06-11 (×2): 50 ug via INTRAVENOUS
  Filled 2021-06-10 (×2): qty 2

## 2021-06-10 MED ORDER — ACETAMINOPHEN 325 MG PO TABS: 650.0000 mg | ORAL_TABLET | Freq: Four times a day (QID) | ORAL | Status: DC | PRN

## 2021-06-10 MED ORDER — SENNOSIDES-DOCUSATE SODIUM 8.6-50 MG PO TABS
2.0000 | ORAL_TABLET | Freq: Every day | ORAL | Status: DC
  Administered 2021-06-11 – 2021-06-13 (×3): 2 via ORAL
  Filled 2021-06-10 (×3): qty 2

## 2021-06-10 MED ORDER — OXYCODONE HCL 5 MG PO TABS
5.0000 mg | ORAL_TABLET | Freq: Four times a day (QID) | ORAL | Status: DC | PRN
  Administered 2021-06-14 – 2021-06-15 (×4): 5 mg via ORAL
  Filled 2021-06-10 (×5): qty 1

## 2021-06-10 MED ORDER — HYDROMORPHONE HCL 1 MG/ML IJ SOLN
1.0000 mg | Freq: Four times a day (QID) | INTRAMUSCULAR | Status: DC | PRN
  Administered 2021-06-11: 1 mg via INTRAVENOUS
  Filled 2021-06-10: qty 1

## 2021-06-10 MED ORDER — ONDANSETRON HCL 4 MG/2ML IJ SOLN
4.0000 mg | Freq: Four times a day (QID) | INTRAMUSCULAR | Status: DC | PRN
  Administered 2021-06-11 – 2021-06-12 (×2): 4 mg via INTRAVENOUS
  Filled 2021-06-10 (×3): qty 2

## 2021-06-10 MED ORDER — NALOXONE HCL 0.4 MG/ML IJ SOLN
0.4000 mg | INTRAMUSCULAR | Status: DC | PRN
  Filled 2021-06-10: qty 1

## 2021-06-10 NOTE — ED Notes (Signed)
Patient transported to X-ray 

## 2021-06-10 NOTE — ED Provider Notes (Signed)
[facility] EMERGENCY DEPARTMENT Provider Note   CSN: [id number] Arrival date & time: 06/10/21  1830     History Chief Complaint  Patient presents with   Fall   Hip Injury    Wheatley is a 80 y.o. male.   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  He is known to have atrial fibrillation and takes Eliquis.  The paramedics transported the patient today after he had an accidental fall at his house.  Report was that the patient was walking with his walker as per his normal.  He tried to turn around and change directions while he was walking in his front room when he fell losing his balance and landing on his right hip laterally.  He was not able to get up by himself but his wife was able to help him back up into a chair.  The paramedics found the patient sitting in a wheelchair, placed him on a stretcher and brought him to the hospital, no medication given prehospital.  Pain is constant, mild to moderate, much worse with any range of motion of the right hip.  No consecutive injuries to the head the neck and denies any chest pain shortness of breath fevers chills nausea vomiting or diarrhea.  No swelling of the legs, no rashes of the skin.  No numbness or weakness  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   Acute on chronic systolic and diastolic heart failure, NYHA class 4 (HCC)    Anemia, iron deficiency 11/15/2011   Arthritis    "hands, right knee, feet" (02/21/2018)   Atrial fibrillation (HCC)    Cardiac arrest ([place]) 02/17/2018   CHF (congestive heart failure) (HCC)    Chronic combined systolic and diastolic heart failure, NYHA class 3 ([place]) 06/2010   felt to be secondary to hypertensive cardiomyopathy   Chronic lower back pain    CKD (chronic kidney  disease) stage V requiring chronic dialysis ([place])    COVID-19 12/2019   ESRD on dialysis [facility])    started 02/2018 [place]   GERD (gastroesophageal reflux disease)    Glaucoma    "I had surgery and dont have it any more"   Gout    daily RX (02/21/2018)   Heart murmur    "mild" per pt   Hepatitis    years ago   HIV (human immunodeficiency virus infection) ([place])    HIV infection ([place])    Hyperlipidemia    Hypertension    Nonischemic cardiomyopathy ([place])    Pneumonia 11/2017   Prostate cancer ([place])    hx of prostate; s/p radioactive seed implant 10/2009 Dr Janice Norrie   Sinus bradycardia    Sleep apnea    does not use a cpap   Stroke [facility])    "mini stroke" years ago   Wears glasses     Patient Active Problem List   Diagnosis Date Noted   Gait disorder 67/89/3810   Complication of vascular dialysis catheter 09/29/2020   Disease of gallbladder, unspecified 05/18/2020   Elevated LFTs    Abdominal pain    Transaminitis 05/15/2020   Full code status 02/03/2020   Subtherapeutic international normalized ratio (INR) 01/24/2020   Pressure injury of skin 01/07/2020   Combined systolic and diastolic heart failure (  [place])    COVID-19 12/11/2019   Acute respiratory failure with hypoxemia (HCC) 11/01/2019   Closed fracture of nasal bones 09/10/2019   Hyperkalemia 09/02/2019   Shortness of breath 04/12/2019   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   Dependence on renal dialysis ([place]) 03/27/2019   Hypertensive heart and chronic kidney disease with heart failure and with stage 5 chronic kidney disease, or end stage renal disease ([place]) 03/27/2019   Mesenteric ischemia (HCC)    Colonic ulcer    Right lower quadrant pain    Abnormal CT of the abdomen    Ischemic colitis ([place])    Colitis presumed infectious 03/09/2019   Acute respiratory failure with hypoxia and hypercapnia (HCC) 03/04/2019   Pruritus, unspecified 02/21/2019   Acute pulmonary edema (HCC) 02/11/2019    Pain, unspecified 01/21/2019   Paroxysmal atrial fibrillation ([place]) 11/16/2018   LBBB (left bundle branch block) 11/16/2018   Pulmonary edema 10/21/2018   Gout 10/21/2018   Hypertensive emergency 10/21/2018   Chronic combined systolic and diastolic heart failure, NYHA class 3 ([place]) 10/21/2018   Leukocytosis 10/21/2018   Gastroesophageal reflux disease 08/28/2018   CAD (coronary artery disease) 04/03/2018   Palliative care encounter 03/27/2018   Atrial fibrillation, chronic ([place]) 03/25/2018   Anemia in chronic kidney disease 03/12/2018   Coagulation defect, unspecified ([place]) 03/12/2018   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   Palliative care by specialist    Advance care planning    Goals of care, counseling/discussion    Respiratory arrest ([place])    Acute on chronic systolic heart failure ([place])    Chronic obstructive pulmonary disease ([place])    HIV (human immunodeficiency virus infection) ([place])    Pneumonia    Aspiration pneumonia of both lower lobes due to regurgitated food ([place])    Sepsis due to pneumonia ([place]) 11/22/2017   AIDS ([place]) 05/14/2014   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   Gastroparesis 05/06/2014   Protein-calorie malnutrition, severe ([place]) 05/05/2014   ESRD on hemodialysis ([place]) 05/02/2014   Hemodialysis-associated hypotension 05/02/2014   Leaking of conjunctival drainage bleb 05/01/2014   POAG (primary open-angle glaucoma) 05/01/2014   Loss of weight 04/29/2014   Conjunctivitis 04/29/2014   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/10/2013   Low back pain 04/09/2012   Osteoarthritis of hip 12/29/2011   Iron deficiency anemia 06/28/2011   Macrocytic anemia 04/02/2011   ADENOCARCINOMA, PROSTATE, 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   Hyperlipidemia 11/30/2010   Transient cerebral ischemia 11/30/2010   HYPERGLYCEMIA 11/30/2010    Past Surgical History:  Procedure Laterality Date   A/V FISTULAGRAM Bilateral 07/30/2020   Procedure: CENTRAL VENOGRAM;   Surgeon: Angelia Mould, MD;  Location: [facility];  Service: Cardiovascular;  Laterality: Bilateral;  bilateral upper venogram    AV FISTULA PLACEMENT Right 10/13/2016   Procedure: ARTERIOVENOUS (AV) FISTULA CREATION;  Surgeon: [name], MD;  Location: [facility];  Service: Vascular;  Laterality: Right;   AV FISTULA PLACEMENT Left 02/25/2018   Procedure: INSERTION OF ARTERIOVENOUS (AV) GORE-TEX GRAFT LEFT UPPER ARM;  Surgeon: Angelia Mould, MD;  Location: [place];  Service: Vascular;  Laterality: Left;   AV FISTULA PLACEMENT Right 08/07/2018   Procedure: Creation of right arm brachiocephalic Fistula;  Surgeon: Waynetta Sandy, MD;  Location: [facility] OR;  Service: Vascular;  Laterality: Right;   AV FISTULA PLACEMENT Left 11/10/2019   Procedure: INSERTION OF ARTERIOVENOUS (AV) GORE-TEX GRAFT THIGH;  Surgeon: Elam Dutch, MD;  Location: [place];  Service: Vascular;  Laterality: Left;  AV FISTULA PLACEMENT Right 12/02/2019   Procedure: INSERTION OF ARTERIOVENOUS (AV) GORE-TEX GRAFT RIGHT  THIGH;  Surgeon: Waynetta Sandy, MD;  Location: [place];  Service: Vascular;  Laterality: Right;   AV FISTULA PLACEMENT Right 08/13/2020   Procedure: INSERTION OF RIGHT ARTERIOVENOUS (AV) GORE-TEX GRAFT ARM;  Surgeon: Angelia Mould, MD;  Location: [place];  Service: Vascular;  Laterality: Right;   [place] Left 06/24/2015   Procedure: BASILIC VEIN TRANSPOSITION;  Surgeon: Mal Misty, MD;  Location: [place];  Service: Vascular;  Laterality: Left;   BIOPSY  03/14/2019   Procedure: BIOPSY;  Surgeon: Irene Shipper, MD;  Location: [place];  Service: Endoscopy;;   CATARACT EXTRACTION, BILATERAL Bilateral    COLONOSCOPY WITH PROPOFOL N/A 03/14/2019   Procedure: COLONOSCOPY WITH PROPOFOL;  Surgeon: Irene Shipper, MD;  Location: [place];  Service: Endoscopy;  Laterality: N/A;   ESOPHAGOGASTRODUODENOSCOPY (EGD) WITH PROPOFOL N/A 05/05/2014   Procedure:  ESOPHAGOGASTRODUODENOSCOPY (EGD) WITH PROPOFOL;  Surgeon: Missy Sabins, MD;  Location: [facility];  Service: Endoscopy;  Laterality: N/A;   EYE SURGERY Bilateral    cataract surgery    GLAUCOMA SURGERY Bilateral    INSERTION OF ARTERIOVENOUS (AV) ARTEGRAFT ARM  10/09/2018   Procedure: INSERTION OF ARTERIOVENOUS (AV) 71mm x 41cm ARTEGRAFT LEFT UPPER ARM;  Surgeon: Waynetta Sandy, MD;  Location: [facility];  Service: Vascular;;   INSERTION OF DIALYSIS CATHETER Right 07/14/2019   Procedure: Insertion Of Dialysis Catheter;  Surgeon: Elam Dutch, MD;  Location: [facility] OR;  Service: Vascular;  Laterality: Right;  placed right Internal Jugular   INSERTION OF DIALYSIS CATHETER Left 08/13/2020   Procedure: INSERTION OF DIALYSIS CATHETER;  Surgeon: Angelia Mould, MD;  Location: [place];  Service: Vascular;  Laterality: Left;   INSERTION PROSTATE RADIATION SEED     IR FLUORO GUIDE CV LINE RIGHT  02/18/2018   IR REMOVAL TUN CV CATH W/O FL  12/06/2018   IR THROMBECTOMY AV FISTULA W/THROMBOLYSIS INC/SHUNT/IMG RIGHT  11/12/2020   IR THROMBECTOMY AV FISTULA W/THROMBOLYSIS/PTA INC/SHUNT/IMG RIGHT Right 01/26/2020   IR THROMBECTOMY AV FISTULA W/THROMBOLYSIS/PTA INC/SHUNT/IMG RIGHT Right 02/24/2020   IR THROMBECTOMY AV FISTULA W/THROMBOLYSIS/PTA INC/SHUNT/IMG RIGHT Right 04/07/2020   IR US GUIDE VASC ACCESS RIGHT  02/18/2018   IR US GUIDE VASC ACCESS RIGHT  01/26/2020   IR US GUIDE VASC ACCESS RIGHT  04/07/2020   IR US GUIDE VASC ACCESS RIGHT  11/12/2020   LEFT HEART CATH AND CORONARY ANGIOGRAPHY N/A 02/20/2018   Procedure: LEFT HEART CATH AND CORONARY ANGIOGRAPHY;  Surgeon: Jettie Booze, MD;  [facility];;; 50% ostial OM2, 25% mLAD.   LEFT HEART CATH AND CORONARY ANGIOGRAPHY  2004   mildly depressed LV systolic fx EF 93%,XTKWIO coronaries/abdominal aorta/renal arteries.   LOWER EXTREMITY ANGIOGRAM Left 11/17/2019   Procedure: Lower Extremity Fistulogram;  Surgeon: Marty Heck, MD;  Location: [place];  Service: Vascular;  Laterality: Left;   NM MYOCAR PERF WALL MOTION  02/21/2010   No ischemia or infarction.  EF 27%.   RADIOACTIVE SEED IMPLANT  2010   prostate cancer   REMOVAL OF A DIALYSIS CATHETER Right 08/13/2020   Procedure: REMOVAL OF A DIALYSIS CATHETER;  Surgeon: Angelia Mould, MD;  Location: [facility];  Service: Vascular;  Laterality: Right;   REVISION OF ARTERIOVENOUS GORETEX GRAFT Right 07/11/2019   Procedure: REVISION OF ARTERIOVENOUS GORETEX GRAFT RIGHT ARM WITH ARTEGRAFT;  Surgeon: Serafina Mitchell, MD;  Location: [facility]  OR;  Service: Vascular;  Laterality: Right;   SHUNTOGRAM Right 07/14/2019   Procedure: Shuntogram;  Surgeon: Elam Dutch, MD;  Location: [place];  Service: Vascular;  Laterality: Right;   THROMBECTOMY AND REVISION OF ARTERIOVENTOUS (AV) GORETEX  GRAFT Right 07/14/2019   Procedure: THROMBECTOMY AND REVISION OF ARTERIOVENTOUS (AV) GORETEX  GRAFT;  Surgeon: Elam Dutch, MD;  Location: [facility];  Service: Vascular;  Laterality: Right;   THROMBECTOMY W/ EMBOLECTOMY Left 02/26/2018   Procedure: THROMBECTOMY ARTERIOVENOUS GRAFT;  Surgeon: Angelia Mould, MD;  Location: [place];  Service: Vascular;  Laterality: Left;   THROMBECTOMY W/ EMBOLECTOMY Left 11/17/2019   Procedure: THROMBECTOMY ARTERIOVENOUS GRAFT LEFT THIGH;  Surgeon: Marty Heck, MD;  Location: [place];  Service: Vascular;  Laterality: Left;   TRANSTHORACIC ECHOCARDIOGRAM  02/2012   ([place]) EF 45-50% with mild global HK.  Mild LVH,LA mod. dilated,mild-mod. 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 AI.   TRANSTHORACIC ECHOCARDIOGRAM  9/'18; 3/'19   a) Severely reduced EF.  25 from 30%.  GR 1 DD with diffuse ecchymosis. Mild AS & AI.  Mild LA/RA dilation; b) (post PEA arest):   Moderately dilated LV with moderate concentric virtually.  EF 15 to 20%.  GR 1 DD.  Paradoxical septal motion. Mild AI.  Mild LA dilation.  Poorly visualized RV.    TRANSTHORACIC ECHOCARDIOGRAM  02/2019   EF 20-25%.  Unable to assess diastolic function (A. Fib).  No LV apical thrombus.  Mild AI.  Mildly reduced RV function, however poorly visualized.   ULTRASOUND GUIDANCE FOR VASCULAR ACCESS  02/20/2018   Procedure: Ultrasound Guidance For Vascular Access;  Surgeon: Jettie Booze, MD;  Location: [facility] [facility];  Service: Cardiovascular;;   UPPER EXTREMITY VENOGRAPHY N/A 07/08/2018   Procedure: UPPER EXTREMITY VENOGRAPHY;  Surgeon: Waynetta Sandy, MD;  Location: [facility];  Service: Cardiovascular;  Laterality: N/A;  Bilateral       Family History  Problem Relation Age of Onset   Hypertension Mother    Thyroid disease Mother    Cholelithiasis Daughter    Cholelithiasis Son    Hypertension Maternal Grandmother    Diabetes Maternal Grandmother    Heart attack Neg Hx    Hyperlipidemia Neg Hx     Social History   Tobacco Use   Smoking status: Former    Packs/day: 1.00    Years: 30.00    Pack years: 30.00    Types: Cigarettes    Quit date: 2006    Years since quitting: 16.5   Smokeless tobacco: Never  Vaping Use   Vaping Use: Never used  Substance Use Topics   Alcohol use: Yes    Alcohol/week: 1.0 standard drink    Types: 1 Shots of liquor per week    Comment: occasional   Drug use: Never    Home Medications Prior to Admission medications   Medication Sig Start Date End Date Taking? Authorizing Provider  acetaminophen (TYLENOL) 500 MG tablet Take 1,000 mg by mouth every 6 (six) hours as needed for moderate pain.    [provider]  allopurinol (ZYLOPRIM) 100 MG tablet TAKE 1 TABLET BY MOUTH 4 TIMES WEEKLY( MONDAY, [place]) AFTER DIALYSIS Patient taking differently: Take 100 mg by mouth See admin instructions. TAKE 1 TABLET BY MOUTH 4 TIMES WEEKLY( MONDAY, [place]) AFTER DIALYSIS 10/20/20   Croitoru, Mihai, MD  amiodarone (PACERONE) 200 MG tablet TAKE 1  TABLET(200 MG) BY MOUTH  DAILY Patient taking differently: Take 200 mg by mouth daily. 10/20/20   Croitoru, Mihai, MD  apixaban (ELIQUIS) 5 MG TABS tablet TAKE 1 TABLET(5 MG) BY MOUTH TWICE DAILY 03/29/21   Croitoru, Mihai, MD  [name] 50-200-25 MG TABS tablet TAKE 1 TABLET BY MOUTH DAILY 04/06/21   Tommy Medal, [place] Islam, MD  brimonidine (ALPHAGAN P) 0.1 % SOLN Place 1 drop into the right eye 2 (two) times daily.    [provider]  dorzolamide-timolol (COSOPT) 22.3-6.8 MG/ML ophthalmic solution Place 1 drop into the right eye 2 (two) times daily.    [provider]  heparin 1000 unit/mL SOLN injection Heparin Sodium (Porcine) 1,000 Units/mL Catheter Lock Arterial 08/21/20 08/20/21  [provider]  lidocaine-prilocaine (EMLA) cream Apply 1 application topically See admin instructions. Use on dialysis days 09/06/20   [provider]  midodrine (PROAMATINE) 10 MG tablet Take 1 tablet (10 mg total) by mouth as directed. Twice a day on the day of HD Tuesday, Thursday, Saturday (1 in AM and 1 at [place]). Take 1/2 tablet (5mg ) a day on non HD days Patient taking differently: Take 10-20 mg by mouth See admin instructions. Take 10 mg in the morning at 5 AM. take 10 mg at 6AM of dialysis days and 10 mg half way through treatment on dialysis days (Mon, Tues, Ten Sleep, and Sat) 04/22/19   Leonie Man, MD  montelukast (SINGULAIR) 10 MG tablet TAKE 1 TABLET(10 MG) BY MOUTH AT BEDTIME 05/06/21   [name], NP  multivitamin (RENA-VIT) TABS tablet Take 1 tablet by mouth every evening. 05/26/20   [provider]  ofloxacin (OCUFLOX) 0.3 % ophthalmic solution Place 1 drop into the right eye 4 (four) times daily. 02/22/21   [name], NP  pantoprazole (PROTONIX) 40 MG tablet TAKE 1 TABLET(40 MG) BY MOUTH DAILY 05/17/21   [name], NP  sucroferric oxyhydroxide (VELPHORO) 500 MG chewable tablet Chew 500 mg by mouth 3 (three) times daily with meals.     [provider]    Allergies    Dextromethorphan-guaifenesin, Tocotrienols, and Losartan potassium  Review of Systems   Review of Systems  All other systems reviewed and are negative.  Physical Exam Updated Vital Signs There were no vitals taken for this visit.  Physical Exam Vitals and nursing note reviewed.  Constitutional:      General: He is not in acute distress.    Appearance: He is well-developed.  HENT:     Head: Normocephalic and atraumatic.     Comments: No signs of trauma to the head or the face    Nose: No congestion or rhinorrhea.     Mouth/Throat:     Pharynx: No oropharyngeal exudate.  Eyes:     General: No scleral icterus.       Right eye: No discharge.        Left eye: No discharge.     Conjunctiva/sclera: Conjunctivae normal.     Pupils: Pupils are equal, round, and reactive to light.  Neck:     Thyroid: No thyromegaly.     Vascular: No JVD.  Cardiovascular:     Rate and Rhythm: Normal rate. Rhythm irregular.     Heart sounds: Normal heart sounds. No murmur heard.   No friction rub. No gallop.  Pulmonary:     Effort: Pulmonary effort is normal. No respiratory distress.     Breath sounds: Normal breath sounds. No wheezing or rales.     Comments: There is no tenderness  over the chest wall, no bruising Chest:     Chest wall: No tenderness.  Abdominal:     General: Bowel sounds are normal. There is no distension.     Palpations: Abdomen is soft. There is no mass.     Tenderness: There is no abdominal tenderness.  Musculoskeletal:        General: Tenderness present. Normal range of motion.     Cervical back: Normal range of motion and neck supple.     Right lower leg: No edema.     Left lower leg: No edema.     Comments: There is a slight leg length discrepancy with the left lower extremity which is shorter than the right lower extremity, there is no rotation of the legs.  The patient has good range of motion of the left leg to both passive  and active range of motion.  The right leg has pain with internal rotation radiating into the inguinal region.  There is no specific tenderness over the lateral bones and with passive range of motion to flexion there is minimal pain.  Normal pulses at the feet, no edema of the legs  Lymphadenopathy:     Cervical: No cervical adenopathy.  Skin:    General: Skin is warm and dry.     Findings: No erythema or rash.  Neurological:     Mental Status: He is alert.     Coordination: Coordination normal.     Comments: Motor and sensation intact, normal mental status  Psychiatric:        Behavior: Behavior normal.    ED Results / Procedures / Treatments   Labs (all labs ordered are listed, but only abnormal results are displayed) Labs Reviewed  BASIC METABOLIC PANEL  CBC WITH DIFFERENTIAL/PLATELET  PROTIME-INR  TYPE AND SCREEN    EKG EKG Interpretation  Date/Time:  Friday June 10 2021 18:45:09 EDT Ventricular Rate:  87 PR Interval:  229 QRS Duration: 140 QT Interval:  411 QTC Calculation: 495 R Axis:   -61 Text Interpretation: Sinus rhythm Prolonged PR interval Left bundle branch block Since last tracing rate slower Confirmed by Noemi Chapel [phone]) on 06/10/2021 6:57:45 PM  Radiology DG Chest 1 View  Result Date: 06/10/2021 CLINICAL DATA:  Recent fall with known right hip fracture, initial encounter EXAM: CHEST  1 VIEW COMPARISON:  11/30/2020 FINDINGS: Cardiac shadow is enlarged but stable. Left dialysis catheter has been removed in the interval. Lungs are clear without evidence of focal infiltrate or effusion. No pneumothorax is noted. No acute bony abnormality is seen. Vascular stenting is again noted right arm. IMPRESSION: Stable cardiomegaly.  No acute abnormality noted. Electronically Signed   By: Inez Catalina M.D.   On: 06/10/2021 19:42   DG Hip Unilat With Pelvis 2-3 Views Right  Result Date: 06/10/2021 CLINICAL DATA:  Recent fall with right hip pain, initial encounter EXAM: DG  HIP (WITH OR WITHOUT PELVIS) 3V RIGHT COMPARISON:  12/15/2020 FINDINGS: There are changes consistent with an undisplaced subcapital femoral neck fracture on the right. Very mild impaction at the fracture site is noted. These changes are new from the prior exam. Pelvic ring is intact. 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 Prostate seeds are again seen. IMPRESSION: Subcapital right femoral neck fracture with very mild impaction at the fracture site. Electronically Signed   By: Inez Catalina M.D.   On: 06/10/2021 19:41    Procedures Ultrasound ED Peripheral IV (Provider)  Date/Time: 06/10/2021 9:37 PM Performed  by: Noemi Chapel, MD Authorized by: Noemi Chapel, MD   Procedure details:    Indications: multiple failed IV attempts     Skin Prep: chlorhexidine gluconate     Location: L External Jugular.   Bedside Ultrasound Guided: No     Patient tolerated procedure without complications: Yes     Dressing applied: Yes   Comments:     Good blood draw and flush   Medications Ordered in ED Medications  fentaNYL (SUBLIMAZE) injection 50 mcg (has no administration in time range)    ED Course  I have reviewed the triage vital signs and the nursing notes.  Pertinent labs & imaging results that were available during my care of the patient were reviewed by me and considered in my medical decision making (see chart for details).    MDM Rules/Calculators/A&P                          The patient will need to be evaluated for any results of traumatic injuries regarding the fall.  The patient is agreeable to the plan, x-rays of the hip and pelvis have been ordered, preop labs EKG and chest x-ray ordered as well.  The patient does appear to have a subcapital fracture of the right hip.  I have paged orthopedics for admission as this patient will likely need to have surgical repair.  He has followed up with orthopedics at [facility] and has seen physician assistant  Carlis Abbott, he has had prior injections in his hip in the past for his end-stage arthritis of the left hip.  Orthopedics paged for consultation given the injuries, will admit to the hospitalist, pain control given his fentanyl  Discussed with Dr. Louanne Skye who will come to see patient in consultation - will need medical admission given multiple underlying medical problems.  Last took Eliquis 3 hours ago - has afib, and has ESRD (M,T,Th,S)  Will admit to medicine.  Discussed the case with Dr. Francia Greaves who will admit the patient to the hospital  Final Clinical Impression(s) / ED Diagnoses Final diagnoses:  Closed subcapital fracture of right femur, initial encounter ([place])  ESRD (end stage renal disease) ([place])     Noemi Chapel, MD 06/10/21 2226

## 2021-06-10 NOTE — ED Triage Notes (Signed)
Pt from home for mechanical fall, sitting in wheelchair on EMS arrival. Hip pain w movement, Cspine cleared PTA. Hx L hip injury, "bad hip." Dialysis T/Th/Sa, compliant. On eliquis, no head trauma  VSS

## 2021-06-10 NOTE — H&P (Addendum)
History and Physical  [facility] NAT:[id number] DOB: 12-Mar-1941 DOA: 06/10/2021  Referring physician: Dr. Sabra Heck, [place]. PCP: [name], NP  Outpatient Specialists: Nephrology, orthopedic surgery. Patient coming from: Home.    Chief Complaint: Fall  HPI: David [name] is a 80 y.o. male with medical history significant for ESRD on HD TTS, HIV, nonischemic cardiomyopathy, hypertension, paroxysmal A. fib on Eliquis who presented to [facility] ED after mechanical fall at home.  He was in the usual state of health prior to that.  He was ambulating with his walker which is his baseline when he tried to turn around to change direction and fell, losing his balance and landing on his right side.  He was not able to get up on his own.  His wife helped him back into a chair.  The paramedics were activated and he was placed on a stretcher and brought to the ED for further evaluation.  He reports right hip pain worse with movement.  Denies any cardiopulmonary or GI symptoms.  Imaging revealed acute right hip fracture.  Orthopedic surgery consulted and recommended hospitalist admission.  ED Course:  Temperature 98.  BP 167/79, pulse 88, O2 saturation 93% on room air.  Lab studies remarkable for WBC 7.6, hemoglobin 10.7, MCV 110, platelet count 160.  Review of Systems: Review of systems as noted in the HPI. All other systems reviewed and are negative.   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   Acute on chronic systolic and diastolic heart failure, NYHA class 4 (HCC)    Anemia, iron deficiency 11/15/2011   Arthritis    "hands, right knee, feet" (02/21/2018)   Atrial fibrillation (HCC)    Cardiac arrest ([place]) 02/17/2018   CHF (congestive heart failure) (HCC)    Chronic combined systolic and diastolic heart failure, NYHA class 3 ([place]) 06/2010   felt to be secondary to hypertensive cardiomyopathy   Chronic lower back pain    CKD (chronic kidney disease)  stage V requiring chronic dialysis ([place])    COVID-19 12/2019   ESRD on dialysis [facility])    started 02/2018 [place]   GERD (gastroesophageal reflux disease)    Glaucoma    "I had surgery and dont have it any more"   Gout    daily RX (02/21/2018)   Heart murmur    "mild" per pt   Hepatitis    years ago   HIV (human immunodeficiency virus infection) ([place])    HIV infection ([place])    Hyperlipidemia    Hypertension    Nonischemic cardiomyopathy ([place])    Pneumonia 11/2017   Prostate cancer ([place])    hx of prostate; s/p radioactive seed implant 10/2009 Dr Janice Norrie   Sinus bradycardia    Sleep apnea    does not use a cpap   Stroke [facility])    "mini stroke" years ago   Wears glasses    Past Surgical History:  Procedure Laterality Date   A/V FISTULAGRAM Bilateral 07/30/2020   Procedure: CENTRAL VENOGRAM;  Surgeon: Angelia Mould, MD;  Location: [place] CV LAB;  Service: Cardiovascular;  Laterality: Bilateral;  bilateral upper venogram    AV FISTULA PLACEMENT Right 10/13/2016   Procedure: ARTERIOVENOUS (AV) FISTULA CREATION;  Surgeon: [name], MD;  Location: [place];  Service: Vascular;  Laterality: Right;   AV FISTULA PLACEMENT Left 02/25/2018   Procedure: INSERTION OF ARTERIOVENOUS (AV) GORE-TEX GRAFT LEFT UPPER ARM;  Surgeon: Angelia Mould, MD;  Location: [facility] OR;  Service: Vascular;  Laterality: Left;   AV FISTULA PLACEMENT Right 08/07/2018   Procedure: Creation of right arm brachiocephalic Fistula;  Surgeon: Waynetta Sandy, MD;  Location: [place];  Service: Vascular;  Laterality: Right;   AV FISTULA PLACEMENT Left 11/10/2019   Procedure: INSERTION OF ARTERIOVENOUS (AV) GORE-TEX GRAFT THIGH;  Surgeon: Elam Dutch, MD;  Location: [place];  Service: Vascular;  Laterality: Left;   AV FISTULA PLACEMENT Right 12/02/2019   Procedure: INSERTION OF ARTERIOVENOUS (AV) GORE-TEX GRAFT RIGHT  THIGH;  Surgeon: Waynetta Sandy, MD;   Location: [place];  Service: Vascular;  Laterality: Right;   AV FISTULA PLACEMENT Right 08/13/2020   Procedure: INSERTION OF RIGHT ARTERIOVENOUS (AV) GORE-TEX GRAFT ARM;  Surgeon: Angelia Mould, MD;  Location: [place];  Service: Vascular;  Laterality: Right;   [place] Left 06/24/2015   Procedure: BASILIC VEIN TRANSPOSITION;  Surgeon: Mal Misty, MD;  Location: [place];  Service: Vascular;  Laterality: Left;   BIOPSY  03/14/2019   Procedure: BIOPSY;  Surgeon: Irene Shipper, MD;  Location: [place];  Service: Endoscopy;;   CATARACT EXTRACTION, BILATERAL Bilateral    COLONOSCOPY WITH PROPOFOL N/A 03/14/2019   Procedure: COLONOSCOPY WITH PROPOFOL;  Surgeon: Irene Shipper, MD;  Location: [place];  Service: Endoscopy;  Laterality: N/A;   ESOPHAGOGASTRODUODENOSCOPY (EGD) WITH PROPOFOL N/A 05/05/2014   Procedure: ESOPHAGOGASTRODUODENOSCOPY (EGD) WITH PROPOFOL;  Surgeon: Missy Sabins, MD;  Location: [facility];  Service: Endoscopy;  Laterality: N/A;   EYE SURGERY Bilateral    cataract surgery    GLAUCOMA SURGERY Bilateral    INSERTION OF ARTERIOVENOUS (AV) ARTEGRAFT ARM  10/09/2018   Procedure: INSERTION OF ARTERIOVENOUS (AV) 17mm x 41cm ARTEGRAFT LEFT UPPER ARM;  Surgeon: Waynetta Sandy, MD;  Location: [place];  Service: Vascular;;   INSERTION OF DIALYSIS CATHETER Right 07/14/2019   Procedure: Insertion Of Dialysis Catheter;  Surgeon: Elam Dutch, MD;  Location: [facility] OR;  Service: Vascular;  Laterality: Right;  placed right Internal Jugular   INSERTION OF DIALYSIS CATHETER Left 08/13/2020   Procedure: INSERTION OF DIALYSIS CATHETER;  Surgeon: Angelia Mould, MD;  Location: [place];  Service: Vascular;  Laterality: Left;   INSERTION PROSTATE RADIATION SEED     IR FLUORO GUIDE CV LINE RIGHT  02/18/2018   IR REMOVAL TUN CV CATH W/O FL  12/06/2018   IR THROMBECTOMY AV FISTULA W/THROMBOLYSIS INC/SHUNT/IMG RIGHT  11/12/2020   IR THROMBECTOMY AV FISTULA  W/THROMBOLYSIS/PTA INC/SHUNT/IMG RIGHT Right 01/26/2020   IR THROMBECTOMY AV FISTULA W/THROMBOLYSIS/PTA INC/SHUNT/IMG RIGHT Right 02/24/2020   IR THROMBECTOMY AV FISTULA W/THROMBOLYSIS/PTA INC/SHUNT/IMG RIGHT Right 04/07/2020   IR US GUIDE VASC ACCESS RIGHT  02/18/2018   IR US GUIDE VASC ACCESS RIGHT  01/26/2020   IR US GUIDE VASC ACCESS RIGHT  04/07/2020   IR US GUIDE VASC ACCESS RIGHT  11/12/2020   LEFT HEART CATH AND CORONARY ANGIOGRAPHY N/A 02/20/2018   Procedure: LEFT HEART CATH AND CORONARY ANGIOGRAPHY;  Surgeon: Jettie Booze, MD;  [facility];;; 50% ostial OM2, 25% mLAD.   LEFT HEART CATH AND CORONARY ANGIOGRAPHY  2004   mildly depressed LV systolic fx EF 79%,[id number] coronaries/abdominal aorta/renal arteries.   LOWER EXTREMITY ANGIOGRAM Left 11/17/2019   Procedure: Lower Extremity Fistulogram;  Surgeon: Marty Heck, MD;  Location: [place];  Service: Vascular;  Laterality: Left;   NM MYOCAR PERF WALL MOTION  02/21/2010   No ischemia or infarction.  EF 27%.   RADIOACTIVE SEED IMPLANT  2010   prostate cancer   REMOVAL OF A DIALYSIS CATHETER Right 08/13/2020   Procedure: REMOVAL OF A DIALYSIS CATHETER;  Surgeon: Angelia Mould, MD;  Location: [place];  Service: Vascular;  Laterality: Right;   REVISION OF ARTERIOVENOUS GORETEX GRAFT Right 07/11/2019   Procedure: REVISION OF ARTERIOVENOUS GORETEX GRAFT RIGHT ARM WITH ARTEGRAFT;  Surgeon: Serafina Mitchell, MD;  Location: [place];  Service: Vascular;  Laterality: Right;   SHUNTOGRAM Right 07/14/2019   Procedure: [place];  Surgeon: Elam Dutch, MD;  Location: [place];  Service: Vascular;  Laterality: Right;   THROMBECTOMY AND REVISION OF ARTERIOVENTOUS (AV) GORETEX  GRAFT Right 07/14/2019   Procedure: THROMBECTOMY AND REVISION OF ARTERIOVENTOUS (AV) GORETEX  GRAFT;  Surgeon: Elam Dutch, MD;  Location: [place];  Service: Vascular;  Laterality: Right;   THROMBECTOMY W/ EMBOLECTOMY Left 02/26/2018   Procedure:  THROMBECTOMY ARTERIOVENOUS GRAFT;  Surgeon: Angelia Mould, MD;  Location: [place];  Service: Vascular;  Laterality: Left;   THROMBECTOMY W/ EMBOLECTOMY Left 11/17/2019   Procedure: THROMBECTOMY ARTERIOVENOUS GRAFT LEFT THIGH;  Surgeon: Marty Heck, MD;  Location: [place];  Service: Vascular;  Laterality: Left;   TRANSTHORACIC ECHOCARDIOGRAM  02/2012   ([place]) EF 45-50% with mild global HK.  Mild LVH,LA mod. dilated,mild-mod. 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 AI.   TRANSTHORACIC ECHOCARDIOGRAM  9/'18; 3/'19   a) Severely reduced EF.  25 from 30%.  GR 1 DD with diffuse ecchymosis. Mild AS & AI.  Mild LA/RA dilation; b) (post PEA arest):   Moderately dilated LV with moderate concentric virtually.  EF 15 to 20%.  GR 1 DD.  Paradoxical septal motion. Mild AI.  Mild LA dilation.  Poorly visualized RV.   TRANSTHORACIC ECHOCARDIOGRAM  02/2019   EF 20-25%.  Unable to assess diastolic function (A. Fib).  No LV apical thrombus.  Mild AI.  Mildly reduced RV function, however poorly visualized.   ULTRASOUND GUIDANCE FOR VASCULAR ACCESS  02/20/2018   Procedure: Ultrasound Guidance For Vascular Access;  Surgeon: Jettie Booze, MD;  Location: [place] CV LAB;  Service: Cardiovascular;;   UPPER EXTREMITY VENOGRAPHY N/A 07/08/2018   Procedure: UPPER EXTREMITY VENOGRAPHY;  Surgeon: Waynetta Sandy, MD;  Location: [place] CV LAB;  Service: Cardiovascular;  Laterality: N/A;  Bilateral    Social History:  reports that he quit smoking about 16 years ago. His smoking use included cigarettes. He has a 30.00 pack-year smoking history. He has never used smokeless tobacco. He reports current alcohol use of about 1.0 standard drink of alcohol per week. He reports that he does not use drugs.   Allergies  Allergen Reactions   Dextromethorphan-Guaifenesin Other (See Comments)    Unknown reaction   Tocotrienols Other (See Comments)    Unknown reaction    Losartan Potassium Other (See Comments)    Causes constipation    Family History  Problem Relation Age of Onset   Hypertension Mother    Thyroid disease Mother    Cholelithiasis Daughter    Cholelithiasis Son    Hypertension Maternal Grandmother    Diabetes Maternal Grandmother    Heart attack Neg Hx    Hyperlipidemia Neg Hx       Prior to Admission medications   Medication Sig Start Date End Date Taking? Authorizing Provider  acetaminophen (TYLENOL) 500 MG tablet Take 1,000 mg by mouth every 6 (six) hours as needed for moderate  pain.   Yes [provider]  allopurinol (ZYLOPRIM) 100 MG tablet TAKE 1 TABLET BY MOUTH 4 TIMES WEEKLY( MONDAY, [place]) AFTER DIALYSIS Patient taking differently: Take 100 mg by mouth See admin instructions. TAKE 1 TABLET BY MOUTH 4 TIMES WEEKLY( MONDAY, TUESDAY,[place] AND SATURDAY) AFTER DIALYSIS 10/20/20  Yes Croitoru, Mihai, MD  amiodarone (PACERONE) 200 MG tablet TAKE 1 TABLET(200 MG) BY MOUTH DAILY Patient taking differently: Take 200 mg by mouth daily. 10/20/20  Yes Croitoru, Mihai, MD  apixaban (ELIQUIS) 5 MG TABS tablet TAKE 1 TABLET(5 MG) BY MOUTH TWICE DAILY Patient taking differently: Take 5 mg by mouth 2 (two) times daily. 03/29/21  Yes Croitoru, Mihai, MD  [name] 50-200-25 MG TABS tablet TAKE 1 TABLET BY MOUTH DAILY 04/06/21  Yes Tommy Medal, Lavell Islam, MD  brimonidine (ALPHAGAN P) 0.1 % SOLN Place 1 drop into the right eye 2 (two) times daily.   Yes [provider]  dorzolamide-timolol (COSOPT) 22.3-6.8 MG/ML ophthalmic solution Place 1 drop into the right eye 2 (two) times daily.   Yes [provider]  Doxercalciferol (HECTOROL IV) Dialysis days Monday,Tuesday,Thursday and saturday 04/26/21 04/25/22 Yes [provider]  heparin 1000 unit/mL SOLN injection Heparin Sodium (Porcine) 1,000 Units/mL Catheter Lock Arterial 08/21/20 08/20/21 Yes [provider]  Methoxy PEG-Epoetin Beta  (MIRCERA IJ) Dialysis days Monday,Tuesday,Thursday and saturday 12/16/20 04/20/22 Yes [provider]  midodrine (PROAMATINE) 10 MG tablet Take 1 tablet (10 mg total) by mouth as directed. Twice a day on the day of HD Tuesday, Thursday, Saturday (1 in AM and 1 at [place]). Take 1/2 tablet (5mg ) a day on non HD days Patient taking differently: Take 10-20 mg by mouth See admin instructions. Take 10 mg in the morning at 5 AM and  10 mg half way through treatment on dialysis days (Mon, Tues, [place], and Sat) 04/22/19  Yes Leonie Man, MD  montelukast (SINGULAIR) 10 MG tablet TAKE 1 TABLET(10 MG) BY MOUTH AT BEDTIME Patient taking differently: Take 10 mg by mouth at bedtime. 05/06/21  Yes [name], NP  multivitamin (RENA-VIT) TABS tablet Take 1 tablet by mouth every evening. 05/26/20  Yes [provider]  ofloxacin (OCUFLOX) 0.3 % ophthalmic solution Place 1 drop into the right eye 4 (four) times daily. 02/22/21  Yes [name], NP  sucroferric oxyhydroxide (VELPHORO) 500 MG chewable tablet Chew 500 mg by mouth 3 (three) times daily as needed (with meals).   Yes [provider]  pantoprazole (PROTONIX) 40 MG tablet TAKE 1 TABLET(40 MG) BY MOUTH DAILY Patient not taking: No sig reported 05/17/21   [name], NP    Physical Exam: BP (!) 156/80   Pulse 90   Temp 98 F (36.7 C) (Oral)   Resp (!) 23   Ht 6\' 2"  (1.88 m)   Wt 84.4 kg   SpO2 93%   BMI 23.89 kg/m   General: 80 y.o. year-old male well developed well nourished in no acute distress.  Alert and oriented x3. Cardiovascular: Regular rate and rhythm with no rubs or gallops.  No thyromegaly or JVD noted.  No lower extremity edema. 2/4 pulses in all 4 extremities. Respiratory: Clear to auscultation with no wheezes or rales. Good inspiratory effort. Abdomen: Soft nontender nondistended with normal bowel sounds x4 quadrants. Muskuloskeletal: No cyanosis, clubbing or edema noted  bilaterally Neuro: CN II-XII intact, strength, sensation, reflexes Skin: No ulcerative lesions noted or rashes Psychiatry: Judgement and insight appear normal. Mood is appropriate for condition  and setting          Labs on Admission:  Basic Metabolic Panel: Recent Labs  Lab 06/10/21 1837  NA 137  K 3.7  CL 92*  CO2 31  GLUCOSE 134*  BUN 29*  CREATININE 7.37*  CALCIUM 9.3   Liver Function Tests: No results for input(s): AST, ALT, ALKPHOS, BILITOT, PROT, ALBUMIN in the last 168 hours. No results for input(s): LIPASE, AMYLASE in the last 168 hours. No results for input(s): AMMONIA in the last 168 hours. CBC: Recent Labs  Lab 06/10/21 1837  WBC 7.6  NEUTROABS 6.1  HGB 10.7*  HCT 32.7*  MCV 110.1*  PLT 160   Cardiac Enzymes: No results for input(s): CKTOTAL, CKMB, CKMBINDEX, TROPONINI in the last 168 hours.  BNP (last 3 results) Recent Labs    11/30/20 1430  BNP 2,788.5*    ProBNP (last 3 results) No results for input(s): PROBNP in the last 8760 hours.  CBG: No results for input(s): GLUCAP in the last 168 hours.  Radiological Exams on Admission: DG Chest 1 View  Result Date: 06/10/2021 CLINICAL DATA:  Recent fall with known right hip fracture, initial encounter EXAM: CHEST  1 VIEW COMPARISON:  11/30/2020 FINDINGS: Cardiac shadow is enlarged but stable. Left dialysis catheter has been removed in the interval. Lungs are clear without evidence of focal infiltrate or effusion. No pneumothorax is noted. No acute bony abnormality is seen. Vascular stenting is again noted right arm. IMPRESSION: Stable cardiomegaly.  No acute abnormality noted. Electronically Signed   By: Inez Catalina M.D.   On: 06/10/2021 19:42   DG Hip Unilat With Pelvis 2-3 Views Right  Result Date: 06/10/2021 CLINICAL DATA:  Recent fall with right hip pain, initial encounter EXAM: DG HIP (WITH OR WITHOUT PELVIS) 3V RIGHT COMPARISON:  12/15/2020 FINDINGS: There are changes consistent with an undisplaced  subcapital femoral neck fracture on the right. Very mild impaction at the fracture site is noted. These changes are new from the prior exam. Pelvic ring is intact. 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 Prostate seeds are again seen. IMPRESSION: Subcapital right femoral neck fracture with very mild impaction at the fracture site. Electronically Signed   By: Inez Catalina M.D.   On: 06/10/2021 19:41    EKG: I independently viewed the EKG done and my findings are as followed: Sinus rhythm rate of 87.  Nonspecific ST-T changes.  QTc 495.  Assessment/Plan Present on Admission: **None**  Active Problems:   S/P right hip fracture  Acute R hip fracture post mechanical fall R hip xray revealed Subcapital right femoral neck fracture with very mild impaction at the fracture site. Last dose of Eliquis was on 06/10/21, continue to hold off until seen by ortho. Pain control Rest of management per ortho  ESRD HD TTS Consult nephrology to resume HD Next HD 06/11/21 Electrolytes and volume status managed with hemodialysis.  Paroxysmal A. fib on Eliquis Continue to hold off Eliquis until seen by orthopedic surgery. Resume home amiodarone Monitor on telemetry  Essential HTN BP is not at goal, elevated Resume home oral antihypertensives Monitor BP  HIV/GERD Resume home regimen   DVT prophylaxis: SCDs   Code Status: Full code   Family Communication: None at bedside   Disposition Plan: Admit to telemetry medical  Consults called: Orthopedic surgery consulted by EDP  Admission status: Inpatient status.  Patient will require at least 2 midnights for further evaluation and treatment of present condition.   Status is:  Inpatient    Dispo:  Patient From: Home  Planned Disposition: Home possibly on 06/13/2021.  Medically stable for discharge: No         Kayleen Memos MD [facility] Pager [phone]  If 7PM-7AM, please contact  night-coverage [email or web address] Password [facility]  06/10/2021, 11:21 PM

## 2021-06-10 NOTE — ED Notes (Signed)
EJ IVAD inserted by ED provider. Labs obtained and sent. Patient offers no complaints at this time.

## 2021-06-11 DIAGNOSIS — D631 Anemia in chronic kidney disease: Secondary | ICD-10-CM

## 2021-06-11 DIAGNOSIS — I10 Essential (primary) hypertension: Secondary | ICD-10-CM

## 2021-06-11 DIAGNOSIS — I5042 Chronic combined systolic (congestive) and diastolic (congestive) heart failure: Secondary | ICD-10-CM

## 2021-06-11 DIAGNOSIS — N185 Chronic kidney disease, stage 5: Secondary | ICD-10-CM

## 2021-06-11 LAB — CBC
HCT: 32.2 % — ABNORMAL LOW (ref 39.0–52.0)
Hemoglobin: 10.6 g/dL — ABNORMAL LOW (ref 13.0–17.0)
MCH: 35.3 pg — ABNORMAL HIGH (ref 26.0–34.0)
MCHC: 32.9 g/dL (ref 30.0–36.0)
MCV: 107.3 fL — ABNORMAL HIGH (ref 80.0–100.0)
Platelets: 151 10*3/uL (ref 150–400)
RBC: 3 MIL/uL — ABNORMAL LOW (ref 4.22–5.81)
RDW: 16.6 % — ABNORMAL HIGH (ref 11.5–15.5)
WBC: 6.8 10*3/uL (ref 4.0–10.5)
nRBC: 0 % (ref 0.0–0.2)

## 2021-06-11 LAB — MAGNESIUM: Magnesium: 2.2 mg/dL (ref 1.7–2.4)

## 2021-06-11 LAB — BASIC METABOLIC PANEL
Anion gap: 13 (ref 5–15)
BUN: 32 mg/dL — ABNORMAL HIGH (ref 8–23)
CO2: 30 mmol/L (ref 22–32)
Calcium: 9.2 mg/dL (ref 8.9–10.3)
Chloride: 93 mmol/L — ABNORMAL LOW (ref 98–111)
Creatinine, Ser: 7.96 mg/dL — ABNORMAL HIGH (ref 0.61–1.24)
GFR, Estimated: 6 mL/min — ABNORMAL LOW (ref >60)
Glucose, Bld: 120 mg/dL — ABNORMAL HIGH (ref 70–99)
Potassium: 3.8 mmol/L (ref 3.5–5.1)
Sodium: 136 mmol/L (ref 135–145)

## 2021-06-11 LAB — PHOSPHORUS: Phosphorus: 5.4 mg/dL — ABNORMAL HIGH (ref 2.5–4.6)

## 2021-06-11 LAB — SARS CORONAVIRUS 2 (TAT 6-24 HRS): SARS Coronavirus 2: NEGATIVE

## 2021-06-11 MED ORDER — LIDOCAINE HCL (PF) 1 % IJ SOLN: 5.0000 mL | INTRAMUSCULAR | Status: DC | PRN

## 2021-06-11 MED ORDER — MIDODRINE HCL 5 MG PO TABS
10.0000 mg | ORAL_TABLET | ORAL | Status: DC
  Administered 2021-06-11: 10 mg via ORAL
  Filled 2021-06-11: qty 2

## 2021-06-11 MED ORDER — BICTEGRAVIR-EMTRICITAB-TENOFOV 50-200-25 MG PO TABS
1.0000 | ORAL_TABLET | Freq: Every day | ORAL | Status: DC
  Administered 2021-06-13 – 2021-06-17 (×4): 1 via ORAL
  Filled 2021-06-11 (×7): qty 1

## 2021-06-11 MED ORDER — LABETALOL HCL 5 MG/ML IV SOLN: 5.0000 mg | Freq: Four times a day (QID) | INTRAVENOUS | Status: DC | PRN

## 2021-06-11 MED ORDER — AMIODARONE HCL 200 MG PO TABS
200.0000 mg | ORAL_TABLET | Freq: Every day | ORAL | Status: DC
  Administered 2021-06-12 – 2021-06-17 (×5): 200 mg via ORAL
  Filled 2021-06-11 (×6): qty 1

## 2021-06-11 MED ORDER — CEFAZOLIN SODIUM-DEXTROSE 2-4 GM/100ML-% IV SOLN
2.0000 g | INTRAVENOUS | Status: AC
  Administered 2021-06-12: 2 g via INTRAVENOUS
  Filled 2021-06-11: qty 100

## 2021-06-11 MED ORDER — SODIUM CHLORIDE 0.9 % IV SOLN: 100.0000 mL | INTRAVENOUS | Status: DC | PRN

## 2021-06-11 MED ORDER — RENA-VITE PO TABS
1.0000 | ORAL_TABLET | Freq: Every evening | ORAL | Status: DC
  Administered 2021-06-11 – 2021-06-16 (×6): 1 via ORAL
  Filled 2021-06-11 (×6): qty 1

## 2021-06-11 MED ORDER — PENTAFLUOROPROP-TETRAFLUOROETH EX AERO: 1.0000 "application " | INHALATION_SPRAY | CUTANEOUS | Status: DC | PRN

## 2021-06-11 MED ORDER — SUCROFERRIC OXYHYDROXIDE 500 MG PO CHEW: 500.0000 mg | CHEWABLE_TABLET | Freq: Three times a day (TID) | ORAL | Status: DC | PRN

## 2021-06-11 MED ORDER — ALTEPLASE 2 MG IJ SOLR: 2.0000 mg | Freq: Once | INTRAMUSCULAR | Status: DC | PRN

## 2021-06-11 MED ORDER — BRIMONIDINE TARTRATE 0.15 % OP SOLN
1.0000 [drp] | Freq: Three times a day (TID) | OPHTHALMIC | Status: DC
  Administered 2021-06-11 – 2021-06-17 (×17): 1 [drp] via OPHTHALMIC
  Filled 2021-06-11: qty 5

## 2021-06-11 MED ORDER — CHLORHEXIDINE GLUCONATE CLOTH 2 % EX PADS
6.0000 | MEDICATED_PAD | Freq: Every day | CUTANEOUS | Status: DC
  Administered 2021-06-11 – 2021-06-17 (×5): 6 via TOPICAL

## 2021-06-11 MED ORDER — LABETALOL HCL 100 MG PO TABS: 100.0000 mg | ORAL_TABLET | Freq: Three times a day (TID) | ORAL | Status: DC | PRN

## 2021-06-11 MED ORDER — LIDOCAINE-PRILOCAINE 2.5-2.5 % EX CREA
1.0000 "application " | TOPICAL_CREAM | CUTANEOUS | Status: DC | PRN
  Filled 2021-06-11: qty 5

## 2021-06-11 MED ORDER — DORZOLAMIDE HCL-TIMOLOL MAL 2-0.5 % OP SOLN
1.0000 [drp] | Freq: Two times a day (BID) | OPHTHALMIC | Status: DC
  Administered 2021-06-11 – 2021-06-17 (×13): 1 [drp] via OPHTHALMIC
  Filled 2021-06-11: qty 10

## 2021-06-11 MED ORDER — HEPARIN SODIUM (PORCINE) 1000 UNIT/ML DIALYSIS
1000.0000 [IU] | INTRAMUSCULAR | Status: DC | PRN
  Filled 2021-06-11: qty 1

## 2021-06-11 MED ORDER — SODIUM CHLORIDE 0.9 % IV SOLN
6.2500 mg | Freq: Four times a day (QID) | INTRAVENOUS | Status: DC | PRN
  Filled 2021-06-11: qty 0.25

## 2021-06-11 MED ORDER — OFLOXACIN 0.3 % OP SOLN
1.0000 [drp] | Freq: Four times a day (QID) | OPHTHALMIC | Status: DC
  Administered 2021-06-11 – 2021-06-17 (×21): 1 [drp] via OPHTHALMIC
  Filled 2021-06-11: qty 5

## 2021-06-11 NOTE — Progress Notes (Signed)
[facility] PROGRESS NOTE    David [name]  VOZ:[id number] DOB: [birth year] DOA: 06/10/2021 PCP: [name], NP      Brief Narrative:  [name] is a 80 y.o. M with ESRD on HD TThS, HIV, pAF on ELiquis, sCHF, anemia, hx Cardiac arrest, stroke and OSA who presented after mechanical fall.  In the ER, found to have a right hip fracture.           Assessment & Plan:  Right subcapital hip fracture -Consult Orthopedics -Hold Eliquis -Consult Palliative care -Acetaminophen, oxycodone or hydromorphone for pain as needed, avoid morphine  With respect to perioperative risk.  His HIV is well-controlled.  He has RCRI 3 due to renal failure, hx stroke and hx CHF, and high cardiac risk.  However, he is optimized currently from standpoint of Afib, CHF and has no active cardiac symptoms.  He is at higher risk for post-operative infection, Afib decompensation, MI, stroke, and CHF flare.    ESRD Interdialytic hypotension -Consult Nephrology for maintenance HD -Continue midodrine  Paroxysmal atrial fibrillation Rate controlled -Continue amio -Hold ELiquis, last dose 7/8  HIV Undetectable last Feb -Continue [name]  Chronic systolic and diastolic CHF Last EF 42-59%, grade II DD.   Appears euvolemic, asymptomatic.  At baseline NYHA 2.  Cerebrovascular disease, secondary prevention  Sleep apnea History of sleep apnea.  Pressure injury, stage II right buttocks, POA Pressure injury, scrotus, stage II POA -Consult WOC  Glaucoma -Continue eye drops  Anemia of CKD Hgb at abseline 10  Gout -Hold allopurinol             Disposition: Status is: Inpatient  Remains inpatient appropriate because: has hip fracture, requires surgery or palliative care managmenet  Dispo:  Patient From: Home  Planned Disposition: Home  Medically stable for discharge: No         Level of care: Telemetry Medical       MDM: The below labs  and imaging reports were reviewed and summarized above.  Medication management as above.    DVT prophylaxis: SCDs Start: 06/10/21 2229  Code Status: FULL Family Communication:         Subjective: No headache, chest pain, dyspnea, palpitations, swelling.  No orthopnea.  No confusion.  Soreness right leg, chronically shorter left leg.  Objective: Vitals:   06/10/21 2230 06/11/21 0145 06/11/21 0230 06/11/21 0323  BP: (!) 156/80 (!) 159/83 (!) 167/85 (!) 157/77  Pulse: 90 90 90 91  Resp: (!) 23 18  18   Temp:    97.8 F (36.6 C)  TempSrc:    Oral  SpO2: 93% 92% 95% 94%  Weight:      Height:       No intake or output data in the 24 hours ending 06/11/21 0800 Filed Weights   06/10/21 1954  Weight: 84.4 kg    Examination: General appearance:  adult male, alert and in no acute distress.  Lying in bed HEENT: Anicteric, conjunctiva pink, lids and lashes normal. No nasal deformity, discharge, epistaxis.  Lips moist.   Skin: Warm and dry.  no jaundice.  No suspicious rashes or lesions. Cardiac: RRR, nl S1-S2, no murmurs appreciated.  Capillary refill is brisk.  JVP not visible laying flat in bed.  No LE edema.  Radial  pulses 2+ and symmetric. Respiratory: Normal respiratory rate and rhythm.  CTAB without rales or wheezes. Abdomen: Abdomen soft.  no TTP. No ascites, distension, hepatosplenomegaly.   MSK: No deformities or effusions. Neuro: Awake  and alert.  EOMI, moves upper extremities with generalized weakness but symmetric strength, strength not tested due to pain. Speech fluent.    Psych: Sensorium intact and responding to questions, attention normal. Affect blunted.  Judgment and insight appear normal.    Data Reviewed: I have personally reviewed following labs and imaging studies:  CBC: Recent Labs  Lab 06/10/21 1837 06/11/21 0444  WBC 7.6 6.8  NEUTROABS 6.1  --   HGB 10.7* 10.6*  HCT 32.7* 32.2*  MCV 110.1* 107.3*  PLT 160 412   Basic Metabolic Panel: Recent  Labs  Lab 06/10/21 1837 06/11/21 0444  NA 137 136  K 3.7 3.8  CL 92* 93*  CO2 31 30  GLUCOSE 134* 120*  BUN 29* 32*  CREATININE 7.37* 7.96*  CALCIUM 9.3 9.2  MG  --  2.2  PHOS  --  5.4*   GFR: Estimated Creatinine Clearance: 8.7 mL/min (A) (by C-G formula based on SCr of 7.96 mg/dL (H)). Liver Function Tests: No results for input(s): AST, ALT, ALKPHOS, BILITOT, PROT, ALBUMIN in the last 168 hours. No results for input(s): LIPASE, AMYLASE in the last 168 hours. No results for input(s): AMMONIA in the last 168 hours. Coagulation Profile: Recent Labs  Lab 06/10/21 1837  INR 1.3*   Cardiac Enzymes: No results for input(s): CKTOTAL, CKMB, CKMBINDEX, TROPONINI in the last 168 hours. BNP (last 3 results) No results for input(s): PROBNP in the last 8760 hours. HbA1C: No results for input(s): HGBA1C in the last 72 hours. CBG: No results for input(s): GLUCAP in the last 168 hours. Lipid Profile: No results for input(s): CHOL, HDL, LDLCALC, TRIG, CHOLHDL, LDLDIRECT in the last 72 hours. Thyroid Function Tests: No results for input(s): TSH, T4TOTAL, FREET4, T3FREE, THYROIDAB in the last 72 hours. Anemia Panel: No results for input(s): VITAMINB12, FOLATE, FERRITIN, TIBC, IRON, RETICCTPCT in the last 72 hours. Urine analysis:    Component Value Date/Time   COLORURINE YELLOW 11/30/2020 2354   APPEARANCEUR CLOUDY (A) 11/30/2020 2354   LABSPEC 1.011 11/30/2020 2354   PHURINE 9.0 (H) 11/30/2020 2354   GLUCOSEU NEGATIVE 11/30/2020 2354   HGBUR SMALL (A) 11/30/2020 2354   BILIRUBINUR NEGATIVE 11/30/2020 2354   BILIRUBINUR negative 04/16/2013 1447   KETONESUR NEGATIVE 11/30/2020 2354   PROTEINUR 100 (A) 11/30/2020 2354   UROBILINOGEN 0.2 05/11/2014 1352   NITRITE NEGATIVE 11/30/2020 2354   LEUKOCYTESUR LARGE (A) 11/30/2020 2354   Sepsis Labs: @LABRCNTIP (procalcitonin:4,lacticacidven:4)  ) Recent Results (from the past 240 hour(s))  SARS CORONAVIRUS 2 (TAT 6-24 HRS)  Nasopharyngeal Nasopharyngeal Swab     Status: None   Collection Time: 06/11/21  1:14 AM   Specimen: Nasopharyngeal Swab  Result Value Ref Range Status   SARS Coronavirus 2 NEGATIVE NEGATIVE Final    Comment: (NOTE) SARS-CoV-2 target nucleic acids are NOT DETECTED.  The SARS-CoV-2 RNA is generally detectable in upper and lower respiratory specimens during the acute phase of infection. Negative results do not preclude SARS-CoV-2 infection, do not rule out co-infections with other pathogens, and should not be used as the sole basis for treatment or other patient management decisions. Negative results must be combined with clinical observations, patient history, and epidemiological information. The expected result is Negative.  Fact Sheet for Patients: SugarRoll.be  Fact Sheet for Healthcare Providers: https://www.woods-mathews.com/  This test is not yet approved or cleared by the [place] FDA and  has been authorized for detection and/or diagnosis of SARS-CoV-2 by FDA under an Emergency Use Authorization (EUA). This EUA will  remain  in effect (meaning this test can be used) for the duration of the COVID-19 declaration under Se ction 564(b)(1) of the Act, 21 U.S.C. section 360bbb-3(b)(1), unless the authorization is terminated or revoked sooner.  Performed at [facility], [place] [name] [place], [place] [address]          Radiology Studies: DG Chest 1 View  Result Date: 06/10/2021 CLINICAL DATA:  Recent fall with known right hip fracture, initial encounter EXAM: CHEST  1 VIEW COMPARISON:  11/30/2020 FINDINGS: Cardiac shadow is enlarged but stable. Left dialysis catheter has been removed in the interval. Lungs are clear without evidence of focal infiltrate or effusion. No pneumothorax is noted. No acute bony abnormality is seen. Vascular stenting is again noted right arm. IMPRESSION: Stable cardiomegaly.  No acute abnormality  noted. Electronically Signed   By: Inez Catalina M.D.   On: 06/10/2021 19:42   DG Hip Unilat With Pelvis 2-3 Views Right  Result Date: 06/10/2021 CLINICAL DATA:  Recent fall with right hip pain, initial encounter EXAM: DG HIP (WITH OR WITHOUT PELVIS) 3V RIGHT COMPARISON:  12/15/2020 FINDINGS: There are changes consistent with an undisplaced subcapital femoral neck fracture on the right. Very mild impaction at the fracture site is noted. These changes are new from the prior exam. Pelvic ring is intact. 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 Prostate seeds are again seen. IMPRESSION: Subcapital right femoral neck fracture with very mild impaction at the fracture site. Electronically Signed   By: Inez Catalina M.D.   On: 06/10/2021 19:41        Scheduled Meds:  amiodarone  200 mg Oral Daily   bictegravir-emtricitabine-tenofovir AF  1 tablet Oral Daily   brimonidine  1 drop Right Eye TID   dorzolamide-timolol  1 drop Right Eye BID   midodrine  10 mg Oral 2 times per day on Mon Tue Thu Sat   multivitamin  1 tablet Oral QPM   ofloxacin  1 drop Right Eye QID   senna-docusate  2 tablet Oral QHS   Continuous Infusions:   LOS: 1 day    Time spent: 35-minute    Edwin Dada, MD [facility] 06/11/2021, 8:00 AM     Please page though [place] or Epic secure chat:  For [employer], contact charge nurse      NUTRITION: The patient's BMI is: Body mass index is 23.89 kg/m.Marland Kitchen Seen by dietician.  I agree with the assessment and plan as outlined below: Nutrition Status:        .      amiodarone  200 mg Oral Daily   bictegravir-emtricitabine-tenofovir AF  1 tablet Oral Daily   brimonidine  1 drop Right Eye TID   dorzolamide-timolol  1 drop Right Eye BID   midodrine  10 mg Oral 2 times per day on Mon Tue Thu Sat   multivitamin  1 tablet Oral QPM   ofloxacin  1 drop Right Eye QID   senna-docusate  2 tablet Oral QHS     acetaminophen,  HYDROmorphone (DILAUDID) injection, naLOXone (NARCAN)  injection, ondansetron (ZOFRAN) IV, oxyCODONE, sucroferric oxyhydroxide   Current Meds  Medication Sig   acetaminophen (TYLENOL) 500 MG tablet Take 1,000 mg by mouth every 6 (six) hours as needed for moderate pain.   allopurinol (ZYLOPRIM) 100 MG tablet TAKE 1 TABLET BY MOUTH 4 TIMES WEEKLY( MONDAY, TUESDAY,THURDSAY AND SATURDAY) AFTER DIALYSIS (Patient taking differently: Take 100 mg by mouth See admin instructions. TAKE  1 TABLET BY MOUTH 4 TIMES WEEKLY( MONDAY, TUESDAY,THURDSAY AND SATURDAY) AFTER DIALYSIS)   amiodarone (PACERONE) 200 MG tablet TAKE 1 TABLET(200 MG) BY MOUTH DAILY (Patient taking differently: Take 200 mg by mouth daily.)   apixaban (ELIQUIS) 5 MG TABS tablet TAKE 1 TABLET(5 MG) BY MOUTH TWICE DAILY (Patient taking differently: Take 5 mg by mouth 2 (two) times daily.)   [name] 50-200-25 MG TABS tablet TAKE 1 TABLET BY MOUTH DAILY   brimonidine (ALPHAGAN P) 0.1 % SOLN Place 1 drop into the right eye 2 (two) times daily.   dorzolamide-timolol (COSOPT) 22.3-6.8 MG/ML ophthalmic solution Place 1 drop into the right eye 2 (two) times daily.   Doxercalciferol (HECTOROL IV) Dialysis days Monday,Tuesday,Thursday and saturday   heparin 1000 unit/mL SOLN injection Heparin Sodium (Porcine) 1,000 Units/mL Catheter Lock Arterial   Methoxy PEG-Epoetin Beta (MIRCERA IJ) Dialysis days Monday,Tuesday,Thursday and saturday   midodrine (PROAMATINE) 10 MG tablet Take 1 tablet (10 mg total) by mouth as directed. Twice a day on the day of HD Tuesday, Thursday, Saturday (1 in AM and 1 at Institute). Take 1/2 tablet (5mg ) a day on non HD days (Patient taking differently: Take 10-20 mg by mouth See admin instructions. Take 10 mg in the morning at 5 AM and  10 mg half way through treatment on dialysis days (Mon, Tues, Thurs, and Sat))   montelukast (SINGULAIR) 10 MG tablet TAKE 1 TABLET(10 MG) BY MOUTH AT BEDTIME (Patient taking differently: Take 10 mg by  mouth at bedtime.)   multivitamin (RENA-VIT) TABS tablet Take 1 tablet by mouth every evening.   ofloxacin (OCUFLOX) 0.3 % ophthalmic solution Place 1 drop into the right eye 4 (four) times daily.   sucroferric oxyhydroxide (VELPHORO) 500 MG chewable tablet Chew 500 mg by mouth 3 (three) times daily as needed (with meals).

## 2021-06-11 NOTE — Consult Note (Signed)
  80 year old male with history of non ischemic cardiomyopathy, ESRD hemodialysis dependent, atrial fibrillation, CAD, He has been followed by Dr. 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 His EF is 15%.  Called today as he now is experiencing right hip pain and inability to walk. Radiographs demontrate an minimally impacted right subcapital hip fracture, no significant displacement.  He is on anticoagulants. I will discuss options with Dr. Ninfa Linden however his comorbidities remain significant and it is likely that non operative management will be appropriate.  Presently on eloquis,  recommend holding till discussed with Dr. Ninfa Linden.

## 2021-06-11 NOTE — Progress Notes (Signed)
     Subjective:   Discussed with Dr. Ninfa Linden, he as a Garden 2 subcapital hip fracture and it is impacted, pin or cannulated screw fixation is recommende and we should proceed with surgery, this can be done with local and face mask anesthesia avoiding general anesthesia.   Patient reports pain as moderate.    Objective:   VITALS:  Temp:  [97.8 F (36.6 C)-98.3 F (36.8 C)] 98.3 F (36.8 C) (07/09 0821) Pulse Rate:  [86-95] 89 (07/09 0821) Resp:  [12-30] 16 (07/09 0821) BP: (129-167)/(69-85) 152/84 (07/09 0821) SpO2:  [92 %-96 %] 94 % (07/09 0821) Weight:  [84.4 kg] 84.4 kg (07/08 1954)  Neurologically intact ABD soft Neurovascular intact Sensation intact distally Intact pulses distally Dorsiflexion/Plantar flexion intact   LABS Recent Labs    06/10/21 1837 06/11/21 0444  HGB 10.7* 10.6*  WBC 7.6 6.8  PLT 160 151   Recent Labs    06/10/21 1837 06/11/21 0444  NA 137 136  K 3.7 3.8  CL 92* 93*  CO2 31 30  BUN 29* 32*  CREATININE 7.37* 7.96*  GLUCOSE 134* 120*   Recent Labs    06/10/21 1837  INR 1.3*     Assessment/Plan:  Right minimally impacted right femoral neck fracture.  ESRD  Will plan to proceed with surgical pinning of the right hip subcapital hip fracture with cannulated screws as soon as I can expect that anticoagulation is normalized or at least improve to where one would not expect significant blood loss due to anesthesia.  Dialysis patient would perfer day off from dialysis. Discussed with Dr. Ninfa Linden.   [name] 06/11/2021, 10:35 AM Patient ID: David [name], male   DOB: 1941/01/28, 80 y.o.   MRN: [id number]

## 2021-06-11 NOTE — Care Plan (Signed)
Called to bedside for tachycardia.  Central monitoring concerned for VT after resuming monitoring after dialysis.    BP (!) 179/95 (BP Location: Left Arm)   Pulse (!) 109   Temp 98 F (36.7 C) (Oral)   Resp (!) 27   Ht 6\' 2"  (1.88 m)   Wt 82.3 kg   SpO2 94%   BMI 23.30 kg/m    Patient nauseated after dialysis, but mentating normally, no chest pain.  No dyspnea, no palpitations.    EKG obtained, this is sinus tachycardia, with his left bundle.  No VT.    Treat nausea, will also start a PRN antihypertensive for severe range hypertension

## 2021-06-11 NOTE — ED Notes (Signed)
Attempted to call report to 6N. No answer.

## 2021-06-11 NOTE — Consult Note (Signed)
[name] Consultation Note    Indication for Consultation:  Management of ESRD/hemodialysis; anemia, hypertension/volume and secondary hyperparathyroidism  PCP:David [name], Melissa, David [name]  HPI: David [name] is a 80 y.o. 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 Patient receiving 4x weekly is due to frequent hospitalizations for fluid overload. His past medical history is significant for HIV, pAF, systolic CHF, anemia, hx cardiac arrest, CVA, and OSA. Patient presents to the ER d/t suffering from a mechanical fall at home-he sustained a R hip fracture. His last HD was on 06/09/21 in which he received full treatment. CXR is unremarkable and lab work currently stable. He denies SOB, CP, ABD pain, and N/V. Ortho is on board. Plan for HD today per patient's regular schedule.  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   Acute on chronic systolic and diastolic heart failure, NYHA class 4 (HCC)    Anemia, [name]   Arthritis    "hands, right knee, feet" (02/21/2018)   Atrial fibrillation (HCC)    Cardiac arrest ([name]) 02/17/2018   CHF (congestive heart failure) (HCC)    Chronic combined systolic and diastolic heart failure, NYHA class 3 ([name]) 06/2010   felt to be secondary to hypertensive cardiomyopathy   Chronic lower back pain    CKD (chronic kidney disease) stage V requiring chronic dialysis ([name])    COVID-19 12/2019   ESRD on dialysis [name])    started 02/2018 [place]   GERD (gastroesophageal reflux disease)    Glaucoma    "I had surgery and dont have it any more"   Gout    daily RX (02/21/2018)   Heart murmur    "mild" per pt   Hepatitis    years ago   HIV (human immunodeficiency virus infection) ([name])    HIV infection ([name])    Hyperlipidemia    Hypertension    Nonischemic cardiomyopathy ([place])    Pneumonia 11/2017   Prostate cancer  ([place])    hx of prostate; s/p radioactive seed implant 10/2009 Dr Janice Norrie   Sinus bradycardia    Sleep apnea    does not use a cpap   Stroke [facility])    "mini stroke" years ago   Wears glasses    Past Surgical History:  Procedure Laterality Date   A/V FISTULAGRAM Bilateral 07/30/2020   Procedure: CENTRAL VENOGRAM;  Surgeon: Angelia Mould, MD;  Location: [place] [facility];  Service: Cardiovascular;  Laterality: Bilateral;  bilateral upper venogram    AV FISTULA PLACEMENT Right 10/13/2016   Procedure: ARTERIOVENOUS (AV) FISTULA CREATION;  Surgeon: [name], MD;  Location: [place];  Service: Vascular;  Laterality: Right;   AV FISTULA PLACEMENT Left 02/25/2018   Procedure: INSERTION OF ARTERIOVENOUS (AV) GORE-TEX GRAFT LEFT UPPER ARM;  Surgeon: Angelia Mould, MD;  Location: [place];  Service: Vascular;  Laterality: Left;   AV FISTULA PLACEMENT Right 08/07/2018   Procedure: Creation of right arm brachiocephalic Fistula;  Surgeon: Waynetta Sandy, MD;  Location: [place];  Service: Vascular;  Laterality: Right;   AV FISTULA PLACEMENT Left 11/10/2019   Procedure: INSERTION OF ARTERIOVENOUS (AV) GORE-TEX GRAFT THIGH;  Surgeon: Elam Dutch, MD;  Location: [place];  Service: Vascular;  Laterality: Left;   AV FISTULA PLACEMENT Right 12/02/2019   Procedure: INSERTION OF ARTERIOVENOUS (AV) GORE-TEX GRAFT RIGHT  THIGH;  Surgeon: Waynetta Sandy, MD;  Location: [place];  Service:  Vascular;  Laterality: Right;   AV FISTULA PLACEMENT Right 08/13/2020   Procedure: INSERTION OF RIGHT ARTERIOVENOUS (AV) GORE-TEX GRAFT ARM;  Surgeon: Angelia Mould, MD;  Location: [place];  Service: Vascular;  Laterality: Right;   [place] Left 06/24/2015   Procedure: BASILIC VEIN TRANSPOSITION;  Surgeon: Mal Misty, MD;  Location: [place];  Service: Vascular;  Laterality: Left;   BIOPSY  03/14/2019   Procedure: BIOPSY;  Surgeon: Irene Shipper, MD;  Location: [facility] ENDOSCOPY;   Service: Endoscopy;;   CATARACT EXTRACTION, BILATERAL Bilateral    COLONOSCOPY WITH PROPOFOL N/A 03/14/2019   Procedure: COLONOSCOPY WITH PROPOFOL;  Surgeon: Irene Shipper, MD;  Location: [place];  Service: Endoscopy;  Laterality: N/A;   ESOPHAGOGASTRODUODENOSCOPY (EGD) WITH PROPOFOL N/A 05/05/2014   Procedure: ESOPHAGOGASTRODUODENOSCOPY (EGD) WITH PROPOFOL;  Surgeon: Missy Sabins, MD;  Location: [facility] ENDOSCOPY;  Service: Endoscopy;  Laterality: N/A;   EYE SURGERY Bilateral    cataract surgery    GLAUCOMA SURGERY Bilateral    INSERTION OF ARTERIOVENOUS (AV) ARTEGRAFT ARM  10/09/2018   Procedure: INSERTION OF ARTERIOVENOUS (AV) 41mm x 41cm ARTEGRAFT LEFT UPPER ARM;  Surgeon: Waynetta Sandy, MD;  Location: [place];  Service: Vascular;;   INSERTION OF DIALYSIS CATHETER Right 07/14/2019   Procedure: Insertion Of Dialysis Catheter;  Surgeon: Elam Dutch, MD;  Location: [place] [name];  Service: Vascular;  Laterality: Right;  placed right Internal Jugular   INSERTION OF DIALYSIS CATHETER Left 08/13/2020   Procedure: INSERTION OF DIALYSIS CATHETER;  Surgeon: Angelia Mould, MD;  Location: [place];  Service: Vascular;  Laterality: Left;   INSERTION PROSTATE RADIATION SEED     IR FLUORO GUIDE CV LINE RIGHT  02/18/2018   IR REMOVAL TUN CV CATH W/O FL  12/06/2018   IR THROMBECTOMY AV FISTULA W/THROMBOLYSIS INC/SHUNT/IMG RIGHT  11/12/2020   IR THROMBECTOMY AV FISTULA W/THROMBOLYSIS/PTA INC/SHUNT/IMG RIGHT Right 01/26/2020   IR THROMBECTOMY AV FISTULA W/THROMBOLYSIS/PTA INC/SHUNT/IMG RIGHT Right 02/24/2020   IR THROMBECTOMY AV FISTULA W/THROMBOLYSIS/PTA INC/SHUNT/IMG RIGHT Right 04/07/2020   IR US GUIDE VASC ACCESS RIGHT  02/18/2018   IR US GUIDE VASC ACCESS RIGHT  01/26/2020   IR US GUIDE VASC ACCESS RIGHT  04/07/2020   IR US GUIDE VASC ACCESS RIGHT  11/12/2020   LEFT HEART CATH AND CORONARY ANGIOGRAPHY N/A 02/20/2018   Procedure: LEFT HEART CATH AND CORONARY ANGIOGRAPHY;  Surgeon: Jettie Booze,  MD;  [facility];;; 50% ostial OM2, 25% mLAD.   LEFT HEART CATH AND CORONARY ANGIOGRAPHY  2004   mildly depressed LV systolic fx EF 76%,HMCNOB coronaries/abdominal aorta/renal arteries.   LOWER EXTREMITY ANGIOGRAM Left 11/17/2019   Procedure: Lower Extremity Fistulogram;  Surgeon: Marty Heck, MD;  Location: [place];  Service: Vascular;  Laterality: Left;   NM MYOCAR PERF WALL MOTION  02/21/2010   No ischemia or infarction.  EF 27%.   RADIOACTIVE SEED IMPLANT  2010   prostate cancer   REMOVAL OF A DIALYSIS CATHETER Right 08/13/2020   Procedure: REMOVAL OF A DIALYSIS CATHETER;  Surgeon: Angelia Mould, MD;  Location: [place] [name];  Service: Vascular;  Laterality: Right;   REVISION OF ARTERIOVENOUS GORETEX GRAFT Right 07/11/2019   Procedure: REVISION OF ARTERIOVENOUS GORETEX GRAFT RIGHT ARM WITH ARTEGRAFT;  Surgeon: Serafina Mitchell, MD;  Location: [facility];  Service: Vascular;  Laterality: Right;   SHUNTOGRAM Right 07/14/2019   Procedure: [place];  Surgeon: Elam Dutch, MD;  Location: [place];  Service: Vascular;  Laterality: Right;   THROMBECTOMY AND REVISION OF ARTERIOVENTOUS (AV) GORETEX  GRAFT Right 07/14/2019   Procedure: THROMBECTOMY AND REVISION OF ARTERIOVENTOUS (AV) GORETEX  GRAFT;  Surgeon: Elam Dutch, MD;  Location: [place];  Service: Vascular;  Laterality: Right;   THROMBECTOMY W/ EMBOLECTOMY Left 02/26/2018   Procedure: THROMBECTOMY ARTERIOVENOUS GRAFT;  Surgeon: Angelia Mould, MD;  Location: [place];  Service: Vascular;  Laterality: Left;   THROMBECTOMY W/ EMBOLECTOMY Left 11/17/2019   Procedure: THROMBECTOMY ARTERIOVENOUS GRAFT LEFT THIGH;  Surgeon: Marty Heck, MD;  Location: [place];  Service: Vascular;  Laterality: Left;   TRANSTHORACIC ECHOCARDIOGRAM  02/2012   ([place]) EF 45-50% with mild global HK.  Mild LVH,LA mod. dilated,mild-mod. 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 AI.   TRANSTHORACIC ECHOCARDIOGRAM   9/'18; 3/'19   a) Severely reduced EF.  25 from 30%.  GR 1 DD with diffuse ecchymosis. Mild AS & AI.  Mild LA/RA dilation; b) (post PEA arest):   Moderately dilated LV with moderate concentric virtually.  EF 15 to 20%.  GR 1 DD.  Paradoxical septal motion. Mild AI.  Mild LA dilation.  Poorly visualized RV.   TRANSTHORACIC ECHOCARDIOGRAM  02/2019   EF 20-25%.  Unable to assess diastolic function (A. Fib).  No LV apical thrombus.  Mild AI.  Mildly reduced RV function, however poorly visualized.   ULTRASOUND GUIDANCE FOR VASCULAR ACCESS  02/20/2018   Procedure: Ultrasound Guidance For Vascular Access;  Surgeon: Jettie Booze, MD;  Location: [place] [facility];  Service: Cardiovascular;;   UPPER EXTREMITY VENOGRAPHY N/A 07/08/2018   Procedure: UPPER EXTREMITY VENOGRAPHY;  Surgeon: Waynetta Sandy, MD;  Location: [place] [facility];  Service: Cardiovascular;  Laterality: N/A;  Bilateral   Family History  Problem Relation Age of Onset   Hypertension Mother    Thyroid disease Mother    Cholelithiasis Daughter    Cholelithiasis Son    Hypertension Maternal Grandmother    Diabetes Maternal Grandmother    Heart attack Neg Hx    Hyperlipidemia Neg Hx    Social History:  reports that he quit smoking about 16 years ago. His smoking use included cigarettes. He has a 30.00 pack-year smoking history. He has never used smokeless tobacco. He reports current alcohol use of about 1.0 standard drink of alcohol per week. He reports that he does not use drugs. Allergies  Allergen Reactions   Dextromethorphan-Guaifenesin Other (See Comments)    Unknown reaction   Tocotrienols Other (See Comments)    Unknown reaction   Losartan Potassium Other (See Comments)    Causes constipation   Prior to Admission medications   Medication Sig Start Date End Date Taking? Authorizing Provider  acetaminophen (TYLENOL) 500 MG tablet Take 1,000 mg by mouth every 6 (six) hours as needed for moderate pain.   Yes  [provider]  allopurinol (ZYLOPRIM) 100 MG tablet TAKE 1 TABLET BY MOUTH 4 TIMES WEEKLY( MONDAY, [place]) AFTER DIALYSIS Patient taking differently: Take 100 mg by mouth See admin instructions. TAKE 1 TABLET BY MOUTH 4 TIMES WEEKLY( MONDAY, TUESDAY,THURDSAY AND SATURDAY) AFTER DIALYSIS 10/20/20  Yes Croitoru, Mihai, MD  amiodarone (PACERONE) 200 MG tablet TAKE 1 TABLET(200 MG) BY MOUTH DAILY Patient taking differently: Take 200 mg by mouth daily. 10/20/20  Yes Croitoru, Mihai, MD  apixaban (ELIQUIS) 5 MG TABS tablet TAKE 1 TABLET(5 MG) BY MOUTH TWICE DAILY Patient taking differently: Take 5 mg by mouth 2 (two) times daily.  03/29/21  Yes Croitoru, Mihai, MD  [name] 50-200-25 MG TABS tablet TAKE 1 TABLET BY MOUTH DAILY 04/06/21  Yes Tommy Medal, Lavell Islam, MD  brimonidine (ALPHAGAN P) 0.1 % SOLN Place 1 drop into the right eye 2 (two) times daily.   Yes [provider]  dorzolamide-timolol (COSOPT) 22.3-6.8 MG/ML ophthalmic solution Place 1 drop into the right eye 2 (two) times daily.   Yes [provider]  Doxercalciferol (HECTOROL IV) Dialysis days Monday,Tuesday,Thursday and saturday 04/26/21 04/25/22 Yes [provider]  heparin 1000 unit/mL SOLN injection Heparin Sodium (Porcine) 1,000 Units/mL Catheter Lock Arterial 08/21/20 08/20/21 Yes [provider]  Methoxy PEG-Epoetin Beta (MIRCERA IJ) Dialysis days Monday,Tuesday,Thursday and saturday 12/16/20 04/20/22 Yes [provider]  midodrine (PROAMATINE) 10 MG tablet Take 1 tablet (10 mg total) by mouth as directed. Twice a day on the day of HD Tuesday, Thursday, Saturday (1 in AM and 1 at [place]). Take 1/2 tablet (5mg ) a day on non HD days Patient taking differently: Take 10-20 mg by mouth See admin instructions. Take 10 mg in the morning at 5 AM and  10 mg half way through treatment on dialysis days (Mon, Tues, [place], and Sat) 04/22/19  Yes Leonie Man, MD  montelukast  (SINGULAIR) 10 MG tablet TAKE 1 TABLET(10 MG) BY MOUTH AT BEDTIME Patient taking differently: Take 10 mg by mouth at bedtime. 05/06/21  Yes [name], [name]  multivitamin (RENA-VIT) TABS tablet Take 1 tablet by mouth every evening. 05/26/20  Yes [provider]  ofloxacin (OCUFLOX) 0.3 % ophthalmic solution Place 1 drop into the right eye 4 (four) times daily. 02/22/21  Yes [name], [name]  sucroferric oxyhydroxide (VELPHORO) 500 MG chewable tablet Chew 500 mg by mouth 3 (three) times daily as needed (with meals).   Yes [provider]  pantoprazole (PROTONIX) 40 MG tablet TAKE 1 TABLET(40 MG) BY MOUTH DAILY Patient not taking: No sig reported 05/17/21   [name], [name]   Current Facility-Administered Medications  Medication Dose Route Frequency Provider Last Rate Last Admin   0.9 %  sodium chloride infusion  100 mL Intravenous PRN Adelfa Koh, [name]       0.9 %  sodium chloride infusion  100 mL Intravenous PRN Adelfa Koh, [name]       acetaminophen (TYLENOL) tablet 650 mg  650 mg Oral Q6H PRN Irene Pap N, DO       alteplase (CATHFLO ACTIVASE) injection 2 mg  2 mg Intracatheter Once PRN Adelfa Koh, [name]       amiodarone (PACERONE) tablet 200 mg  200 mg Oral Daily Hall, Carole N, DO       bictegravir-emtricitabine-tenofovir AF ([name]) 50-200-25 MG per tablet 1 tablet  1 tablet Oral Daily Hall, Carole N, DO       brimonidine (ALPHAGAN) 0.15 % ophthalmic solution 1 drop  1 drop Right Eye TID Hall, Carole N, DO   1 drop at 06/11/21 1223   Chlorhexidine Gluconate Cloth 2 % PADS 6 each  6 each Topical Daily Danford, Suann Larry, MD       dorzolamide-timolol (COSOPT) 22.3-6.8 MG/ML ophthalmic solution 1 drop  1 drop Right Eye BID Irene Pap N, DO   1 drop at 06/11/21 1222   heparin injection 1,000 Units  1,000 Units Dialysis PRN Adelfa Koh, [name]       HYDROmorphone (DILAUDID) injection 1 mg  1 mg Intravenous Q6H PRN Kayleen Memos, DO  lidocaine (PF) (XYLOCAINE) 1 % injection 5 mL  5 mL Intradermal PRN Adelfa Koh, [name]       lidocaine-prilocaine (EMLA) cream 1 application  1 application Topical PRN Adelfa Koh, [name]       midodrine (PROAMATINE) tablet 10 mg  10 mg Oral 2 times per day on Mon Tue Thu Sat Kayleen Memos, DO   10 mg at 06/11/21 1216   multivitamin (RENA-VIT) tablet 1 tablet  1 tablet Oral QPM Irene Pap N, DO       naloxone [facility]) injection 0.4 mg  0.4 mg Intravenous PRN Irene Pap N, DO       ofloxacin (OCUFLOX) 0.3 % ophthalmic solution 1 drop  1 drop Right Eye QID Irene Pap N, DO   1 drop at 06/11/21 1218   ondansetron (ZOFRAN) injection 4 mg  4 mg Intravenous Q6H PRN Irene Pap N, DO       oxyCODONE (Oxy IR/ROXICODONE) immediate release tablet 5 mg  5 mg Oral Q6H PRN Kayleen Memos, DO       pentafluoroprop-tetrafluoroeth (GEBAUERS) aerosol 1 application  1 application Topical PRN Adelfa Koh, [name]       senna-docusate (Senokot-S) tablet 2 tablet  2 tablet Oral QHS Irene Pap N, DO       sucroferric oxyhydroxide (VELPHORO) chewable tablet 500 mg  500 mg Oral TID PRN Kayleen Memos, DO       Labs: Basic Metabolic Panel: Recent Labs  Lab 06/10/21 1837 06/11/21 0444  NA 137 136  K 3.7 3.8  CL 92* 93*  CO2 31 30  GLUCOSE 134* 120*  BUN 29* 32*  CREATININE 7.37* 7.96*  CALCIUM 9.3 9.2  PHOS  --  5.4*   Liver Function Tests: No results for input(s): AST, ALT, ALKPHOS, BILITOT, PROT, ALBUMIN in the last 168 hours. No results for input(s): LIPASE, AMYLASE in the last 168 hours. No results for input(s): AMMONIA in the last 168 hours. CBC: Recent Labs  Lab 06/10/21 1837 06/11/21 0444  WBC 7.6 6.8  NEUTROABS 6.1  --   HGB 10.7* 10.6*  HCT 32.7* 32.2*  MCV 110.1* 107.3*  PLT 160 151   Cardiac Enzymes: No results for input(s): CKTOTAL, CKMB, CKMBINDEX, TROPONINI in the last 168 hours. CBG: No results for input(s): GLUCAP in the last 168 hours. David  Studies: No results for input(s): David, TIBC, TRANSFERRIN, FERRITIN in the last 72 hours. Studies/Results: DG Chest 1 View  Result Date: 06/10/2021 CLINICAL DATA:  Recent fall with known right hip fracture, initial encounter EXAM: CHEST  1 VIEW COMPARISON:  11/30/2020 FINDINGS: Cardiac shadow is enlarged but stable. Left dialysis catheter has been removed in the interval. Lungs are clear without evidence of focal infiltrate or effusion. No pneumothorax is noted. No acute bony abnormality is seen. Vascular stenting is again noted right arm. IMPRESSION: Stable cardiomegaly.  No acute abnormality noted. Electronically Signed   By: Inez Catalina M.D.   On: 06/10/2021 19:42   DG Hip Unilat With Pelvis 2-3 Views Right  Result Date: 06/10/2021 CLINICAL DATA:  Recent fall with right hip pain, initial encounter EXAM: DG HIP (WITH OR WITHOUT PELVIS) 3V RIGHT COMPARISON:  12/15/2020 FINDINGS: There are changes consistent with an undisplaced subcapital femoral neck fracture on the right. Very mild impaction at the fracture site is noted. These changes are new from the prior exam. Pelvic ring is intact. 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 Prostate seeds are again  seen. IMPRESSION: Subcapital right femoral neck fracture with very mild impaction at the fracture site. Electronically Signed   By: Inez Catalina M.D.   On: 06/10/2021 19:41    Physical Exam: Vitals:   06/11/21 0230 06/11/21 0323 06/11/21 0821 06/11/21 1224  BP: (!) 167/85 (!) 157/77 (!) 152/84 (!) 158/88  Pulse: 90 91 89 94  Resp:  18 16 17   Temp:  97.8 F (36.6 C) 98.3 F (36.8 C)   TempSrc:  Oral Oral   SpO2: 95% 94% 94%   Weight:      Height:         General: WDWN NAD Head: NCAT sclera not icteric MMM Lungs: Breathing mildly labored; noted fine crackles anteriorly-RUL-no wheeze or rhonchi.  Heart: RRR. No murmur, rubs or gallops.  Abdomen: soft, nontender, + bowel sounds Lower extremities:no edema BLLE Neuro:  AAOx3. Moves all extremities spontaneously. Psych:  Responds to questions appropriately with a normal affect. Dialysis Access: R AVG (+) Bruit/Thrill  Dialysis Orders:  Mon/Tues/Thurs/Sat - [facility]  4hrs, BFR 400, DFR 1.5 AutoFlow,  EDW 84kg, 2K/ 2Ca  Heparin 2000 unit bolus Q HD Mircera 75 mcg q2wks - last 06/02/21 Venofer 50mg  IV weekly-last 06/07/21 Hectorol 69mcg IV qHD-last 06/09/21  Home meds: Velphoro 500mg  chewable 1 tab TID with meals and BID PRN with snacks Midodrine 10mg  TID on HD days (Mon/Tues/Thurs/Sat)  Last Labs: Hgb 10.6, K 3.8, Ca 9.2, P 5.4  Assessment/Plan: Right Subcapital Hip Fracture-Ortho consulted-recommended pin or cannulated screw fixation-awaiting date of procedure ESRD - on HD 4x weekly Mon/Tues/Thurs/Sat d/t frequent hospitalizations for volume overload. Reviewed OP HD records. Patient has been reaching EDW. Noted fine crackles RUL but no signs for edema. Plan for HD today. Also plan for patient to receive HD on TTS schedule while here in [name]-d/t staffing patterns we currently have on HD unit. We will certainly provide extra HD treatments if clinically indicated. Plan to re-evaluate patient tomorrow on volume status. Hypertension/volume  - Blood pressures elevated-noted patient cutting previous HD treatments short in outpatient-I suspect this is due to volume. Plan for HD today to remove fluid. Patient also on Midodrine on HD days for low blood pressures in outpatient-resume here and monitor BP trends while on HD. Continue to closely monitor volume status.  Anemia of CKD - Hgb 10.6-ESA not due yet-continue to monitor trends.  Secondary Hyperparathyroidism - Ca and PO4 okay-continue binder  Nutrition - Continue renal diet with fluid restriction.  Tobie Poet, [name] 06/11/2021, 12:35 PM

## 2021-06-11 NOTE — Progress Notes (Signed)
Hemodialysis treatment complete. UF off 1.5 liters. Alerted Floor RN that patient BP increased last one 179/95 Pulse 109. Patient states he is not in pain just hungry alerted floor RN.

## 2021-06-11 NOTE — Progress Notes (Signed)
Patient ID: [name], male   DOB: Sep 02, 1941, 80 y.o.   MRN: [id number] Discussed right hip fracture with Dr. Loleta Books. Will plan to schedule for right hip pinning in OR tomorrow AM hopefully as a first case. Dialysis today. Okay to allow diet and will write orders for NPO past midnight.

## 2021-06-11 NOTE — Progress Notes (Signed)
At arrival to room full set of VS/ EKG done and Dr.Danford came to room to assess patient see new orders. Zofran given for nausea at this time. Will continue to monitoring and will endorse to oncoming shift.

## 2021-06-12 ENCOUNTER — Inpatient Hospital Stay (HOSPITAL_COMMUNITY): Payer: Medicare HMO | Admitting: Certified Registered Nurse Anesthetist

## 2021-06-12 ENCOUNTER — Encounter (HOSPITAL_COMMUNITY)
Admission: EM | Disposition: A | Discharge status: Skilled Nursing Facility | Payer: Self-pay | Source: Home / Self Care | Attending: Family Medicine

## 2021-06-12 ENCOUNTER — Encounter (HOSPITAL_COMMUNITY): Payer: Self-pay | Admitting: Internal Medicine

## 2021-06-12 ENCOUNTER — Inpatient Hospital Stay (HOSPITAL_COMMUNITY): Payer: Medicare HMO

## 2021-06-12 DIAGNOSIS — I5022 Chronic systolic (congestive) heart failure: Secondary | ICD-10-CM

## 2021-06-12 DIAGNOSIS — S72011A Unspecified intracapsular fracture of right femur, initial encounter for closed fracture: Principal | ICD-10-CM

## 2021-06-12 HISTORY — PX: HIP PINNING,CANNULATED: SHX1758

## 2021-06-12 LAB — SURGICAL PCR SCREEN
MRSA, PCR: NEGATIVE
Staphylococcus aureus: NEGATIVE

## 2021-06-12 SURGERY — FIXATION, FEMUR, NECK, PERCUTANEOUS, USING SCREW
Anesthesia: General | Site: Hip | Laterality: Right

## 2021-06-12 MED ORDER — FENTANYL CITRATE (PF) 250 MCG/5ML IJ SOLN
INTRAMUSCULAR | Status: DC | PRN
  Administered 2021-06-12: 75 ug via INTRAVENOUS
  Administered 2021-06-12: 25 ug via INTRAVENOUS

## 2021-06-12 MED ORDER — ENSURE PRE-SURGERY PO LIQD
296.0000 mL | Freq: Once | ORAL | Status: AC
  Administered 2021-06-12: 296 mL via ORAL
  Filled 2021-06-12: qty 296

## 2021-06-12 MED ORDER — POLYETHYLENE GLYCOL 3350 17 G PO PACK
17.0000 g | PACK | Freq: Every day | ORAL | Status: DC | PRN
  Filled 2021-06-12: qty 1

## 2021-06-12 MED ORDER — SODIUM CHLORIDE 0.9 % IV SOLN: INTRAVENOUS | Status: DC

## 2021-06-12 MED ORDER — ACETAMINOPHEN 500 MG PO TABS
500.0000 mg | ORAL_TABLET | Freq: Four times a day (QID) | ORAL | Status: AC
  Administered 2021-06-12 (×2): 500 mg via ORAL
  Filled 2021-06-12 (×2): qty 1

## 2021-06-12 MED ORDER — HYDROCODONE-ACETAMINOPHEN 7.5-325 MG PO TABS: 1.0000 | ORAL_TABLET | ORAL | Status: DC | PRN

## 2021-06-12 MED ORDER — ALBUMIN HUMAN 5 % IV SOLN: INTRAVENOUS | Status: DC | PRN

## 2021-06-12 MED ORDER — CHLORHEXIDINE GLUCONATE 0.12 % MT SOLN: 15.0000 mL | Freq: Once | OROMUCOSAL | Status: AC

## 2021-06-12 MED ORDER — APIXABAN 2.5 MG PO TABS
2.5000 mg | ORAL_TABLET | Freq: Two times a day (BID) | ORAL | Status: DC
  Administered 2021-06-13 – 2021-06-17 (×8): 2.5 mg via ORAL
  Filled 2021-06-12 (×10): qty 1

## 2021-06-12 MED ORDER — ACETAMINOPHEN 500 MG PO TABS
1000.0000 mg | ORAL_TABLET | Freq: Once | ORAL | Status: AC
  Administered 2021-06-12: 1000 mg via ORAL
  Filled 2021-06-12: qty 2

## 2021-06-12 MED ORDER — ONDANSETRON HCL 4 MG/2ML IJ SOLN
INTRAMUSCULAR | Status: DC | PRN
  Administered 2021-06-12: 4 mg via INTRAVENOUS

## 2021-06-12 MED ORDER — ACETAMINOPHEN 500 MG PO TABS
1000.0000 mg | ORAL_TABLET | Freq: Three times a day (TID) | ORAL | Status: DC
  Administered 2021-06-13 – 2021-06-17 (×12): 1000 mg via ORAL
  Filled 2021-06-12 (×12): qty 2

## 2021-06-12 MED ORDER — SORBITOL 70 % SOLN: 30.0000 mL | Freq: Every day | Status: DC | PRN

## 2021-06-12 MED ORDER — PHENYLEPHRINE HCL-NACL 10-0.9 MG/250ML-% IV SOLN
INTRAVENOUS | Status: DC | PRN
  Administered 2021-06-12: 50 ug/min via INTRAVENOUS
  Administered 2021-06-12: 25 ug/min via INTRAVENOUS

## 2021-06-12 MED ORDER — BUPIVACAINE HCL (PF) 0.25 % IJ SOLN
INTRAMUSCULAR | Status: DC | PRN
  Administered 2021-06-12: 14 mL

## 2021-06-12 MED ORDER — MENTHOL 3 MG MT LOZG: 1.0000 | LOZENGE | OROMUCOSAL | Status: DC | PRN

## 2021-06-12 MED ORDER — GABAPENTIN 300 MG PO CAPS
300.0000 mg | ORAL_CAPSULE | Freq: Once | ORAL | Status: AC
Start: 2021-06-12 — End: 2021-06-12
  Administered 2021-06-12: 300 mg via ORAL
  Filled 2021-06-12: qty 1

## 2021-06-12 MED ORDER — PANTOPRAZOLE SODIUM 40 MG PO TBEC
40.0000 mg | DELAYED_RELEASE_TABLET | Freq: Every day | ORAL | Status: DC
  Administered 2021-06-12 – 2021-06-17 (×5): 40 mg via ORAL
  Filled 2021-06-12 (×5): qty 1

## 2021-06-12 MED ORDER — DEXAMETHASONE SODIUM PHOSPHATE 10 MG/ML IJ SOLN
INTRAMUSCULAR | Status: DC | PRN
  Administered 2021-06-12: 10 mg via INTRAVENOUS

## 2021-06-12 MED ORDER — ALLOPURINOL 100 MG PO TABS
100.0000 mg | ORAL_TABLET | ORAL | Status: DC
  Administered 2021-06-14 – 2021-06-16 (×2): 100 mg via ORAL
  Filled 2021-06-12 (×2): qty 1

## 2021-06-12 MED ORDER — ROCURONIUM BROMIDE 10 MG/ML (PF) SYRINGE
PREFILLED_SYRINGE | INTRAVENOUS | Status: DC | PRN
  Administered 2021-06-12: 40 mg via INTRAVENOUS

## 2021-06-12 MED ORDER — MIDODRINE HCL 5 MG PO TABS
10.0000 mg | ORAL_TABLET | ORAL | Status: DC
  Administered 2021-06-14 – 2021-06-16 (×3): 10 mg via ORAL
  Filled 2021-06-12 (×2): qty 2

## 2021-06-12 MED ORDER — BUPIVACAINE HCL (PF) 0.25 % IJ SOLN
INTRAMUSCULAR | Status: AC
  Filled 2021-06-12: qty 30

## 2021-06-12 MED ORDER — 0.9 % SODIUM CHLORIDE (POUR BTL) OPTIME
TOPICAL | Status: DC | PRN
  Administered 2021-06-12: 1000 mL

## 2021-06-12 MED ORDER — CHLORHEXIDINE GLUCONATE 0.12 % MT SOLN
OROMUCOSAL | Status: AC
  Administered 2021-06-12: 15 mL via OROMUCOSAL
  Filled 2021-06-12: qty 15

## 2021-06-12 MED ORDER — METOCLOPRAMIDE HCL 5 MG PO TABS
5.0000 mg | ORAL_TABLET | Freq: Three times a day (TID) | ORAL | Status: DC | PRN
  Administered 2021-06-15: 10 mg via ORAL
  Filled 2021-06-12: qty 2

## 2021-06-12 MED ORDER — MORPHINE SULFATE (PF) 2 MG/ML IV SOLN: 0.5000 mg | INTRAVENOUS | Status: DC | PRN

## 2021-06-12 MED ORDER — MONTELUKAST SODIUM 10 MG PO TABS
10.0000 mg | ORAL_TABLET | Freq: Every day | ORAL | Status: DC
  Administered 2021-06-12 – 2021-06-16 (×5): 10 mg via ORAL
  Filled 2021-06-12 (×5): qty 1

## 2021-06-12 MED ORDER — HYDROCODONE-ACETAMINOPHEN 5-325 MG PO TABS: 1.0000 | ORAL_TABLET | ORAL | Status: DC | PRN

## 2021-06-12 MED ORDER — ACETAMINOPHEN 325 MG PO TABS: 325.0000 mg | ORAL_TABLET | Freq: Four times a day (QID) | ORAL | Status: DC | PRN

## 2021-06-12 MED ORDER — METOCLOPRAMIDE HCL 5 MG/ML IJ SOLN: 5.0000 mg | Freq: Three times a day (TID) | INTRAMUSCULAR | Status: DC | PRN

## 2021-06-12 MED ORDER — LIDOCAINE 2% (20 MG/ML) 5 ML SYRINGE
INTRAMUSCULAR | Status: DC | PRN
  Administered 2021-06-12: 60 mg via INTRAVENOUS

## 2021-06-12 MED ORDER — FENTANYL CITRATE (PF) 250 MCG/5ML IJ SOLN
INTRAMUSCULAR | Status: AC
  Filled 2021-06-12: qty 5

## 2021-06-12 MED ORDER — CEFAZOLIN SODIUM-DEXTROSE 2-4 GM/100ML-% IV SOLN
2.0000 g | Freq: Four times a day (QID) | INTRAVENOUS | Status: AC
  Administered 2021-06-12 (×2): 2 g via INTRAVENOUS
  Filled 2021-06-12 (×3): qty 100

## 2021-06-12 MED ORDER — PHENOL 1.4 % MT LIQD: 1.0000 | OROMUCOSAL | Status: DC | PRN

## 2021-06-12 MED ORDER — ORAL CARE MOUTH RINSE: 15.0000 mL | Freq: Once | OROMUCOSAL | Status: AC

## 2021-06-12 MED ORDER — VASOPRESSIN 20 UNIT/ML IV SOLN
INTRAVENOUS | Status: AC
  Filled 2021-06-12: qty 1

## 2021-06-12 MED ORDER — PROPOFOL 10 MG/ML IV BOLUS
INTRAVENOUS | Status: DC | PRN
  Administered 2021-06-12: 20 mg via INTRAVENOUS
  Administered 2021-06-12: 30 mg via INTRAVENOUS

## 2021-06-12 MED ORDER — SUGAMMADEX SODIUM 200 MG/2ML IV SOLN
INTRAVENOUS | Status: DC | PRN
  Administered 2021-06-12: 200 mg via INTRAVENOUS

## 2021-06-12 MED ORDER — FLEET ENEMA 7-19 GM/118ML RE ENEM: 1.0000 | ENEMA | Freq: Once | RECTAL | Status: DC | PRN

## 2021-06-12 MED ORDER — DOCUSATE SODIUM 100 MG PO CAPS
100.0000 mg | ORAL_CAPSULE | Freq: Two times a day (BID) | ORAL | Status: DC
  Administered 2021-06-12 – 2021-06-14 (×5): 100 mg via ORAL
  Filled 2021-06-12 (×5): qty 1

## 2021-06-12 MED ORDER — PROPOFOL 10 MG/ML IV BOLUS
INTRAVENOUS | Status: AC
  Filled 2021-06-12: qty 20

## 2021-06-12 MED ORDER — ALLOPURINOL 100 MG PO TABS: 100.0000 mg | ORAL_TABLET | ORAL | Status: DC

## 2021-06-12 MED ORDER — OXYCODONE HCL 5 MG PO TABS: 5.0000 mg | ORAL_TABLET | Freq: Four times a day (QID) | ORAL | Status: DC | PRN

## 2021-06-12 MED ORDER — HYDROMORPHONE HCL 1 MG/ML IJ SOLN
1.0000 mg | INTRAMUSCULAR | Status: DC | PRN
  Administered 2021-06-15 – 2021-06-17 (×2): 1 mg via INTRAVENOUS
  Filled 2021-06-12 (×2): qty 1

## 2021-06-12 SURGICAL SUPPLY — 40 items
APL SKNCLS STERI-STRIP NONHPOA (GAUZE/BANDAGES/DRESSINGS) ×1
BAG COUNTER SPONGE SURGICOUNT (BAG) ×2 IMPLANT
BAG SPNG CNTER NS LX DISP (BAG) ×1
BENZOIN TINCTURE PRP APPL 2/3 (GAUZE/BANDAGES/DRESSINGS) ×1 IMPLANT
BIT DRILL CANN LRG QC 5X300 (BIT) ×1 IMPLANT
BNDG COHESIVE 4X5 TAN STRL (GAUZE/BANDAGES/DRESSINGS) ×3 IMPLANT
COVER PERINEAL POST (MISCELLANEOUS) ×2 IMPLANT
COVER SURGICAL LIGHT HANDLE (MISCELLANEOUS) ×2 IMPLANT
DRAPE C-ARM 42X72 X-RAY (DRAPES) ×1 IMPLANT
DRAPE STERI IOBAN 125X83 (DRAPES) ×2 IMPLANT
DRESSING MEPILEX FLEX 4X4 (GAUZE/BANDAGES/DRESSINGS) IMPLANT
DRSG MEPILEX FLEX 4X4 (GAUZE/BANDAGES/DRESSINGS) ×2
DURAPREP 26ML APPLICATOR (WOUND CARE) ×2 IMPLANT
ELECT REM PT RETURN 9FT ADLT (ELECTROSURGICAL) ×2
ELECTRODE REM PT RTRN 9FT ADLT (ELECTROSURGICAL) ×1 IMPLANT
GLOVE SURG 8.5 LATEX PF (GLOVE) ×2 IMPLANT
GLOVE SURG LTX SZ9 (GLOVE) ×2 IMPLANT
GOWN STRL REUS W/TWL 2XL LVL3 (GOWN DISPOSABLE) ×5 IMPLANT
GUIDEWIRE THREADED 2.8 (WIRE) ×4 IMPLANT
KIT BASIN OR (CUSTOM PROCEDURE TRAY) ×2 IMPLANT
KIT TURNOVER KIT B (KITS) ×2 IMPLANT
NDL HYPO 25GX1X1/2 BEV (NEEDLE) IMPLANT
NEEDLE HYPO 25GX1X1/2 BEV (NEEDLE) ×2 IMPLANT
NS IRRIG 1000ML POUR BTL (IV SOLUTION) ×2 IMPLANT
PACK GENERAL/GYN (CUSTOM PROCEDURE TRAY) ×2 IMPLANT
PAD ARMBOARD 7.5X6 YLW CONV (MISCELLANEOUS) ×4 IMPLANT
SCREW CANN 16 THRD/85 6.5 (Screw) ×1 IMPLANT
SCREW CANN 16 THRD/95 6.5 (Screw) ×1 IMPLANT
SCREW CANN 6.5X105 16MM THRD (Screw) ×1 IMPLANT
STRIP CLOSURE SKIN 1/2X4 (GAUZE/BANDAGES/DRESSINGS) ×1 IMPLANT
SUT VIC AB 0 CT1 27 (SUTURE) ×2
SUT VIC AB 0 CT1 27XBRD ANBCTR (SUTURE) IMPLANT
SUT VIC AB 1 CT1 27 (SUTURE) ×2
SUT VIC AB 1 CT1 27XBRD ANBCTR (SUTURE) IMPLANT
SUT VIC AB 2-0 CT1 27 (SUTURE) ×2
SUT VIC AB 2-0 CT1 TAPERPNT 27 (SUTURE) IMPLANT
SUT VIC AB 4-0 PS2 27 (SUTURE) ×2 IMPLANT
SYR CONTROL 10ML LL (SYRINGE) ×1 IMPLANT
TOWEL GREEN STERILE (TOWEL DISPOSABLE) ×2 IMPLANT
WATER STERILE IRR 1000ML POUR (IV SOLUTION) ×2 IMPLANT

## 2021-06-12 NOTE — Transfer of Care (Signed)
Immediate Anesthesia Transfer of Care Note  Patient: [facility]  Procedure(s) Performed: CANNULATED RIGHT HIP PINNING (Right: Hip)  Patient Location: PACU  Anesthesia Type:General  Level of Consciousness: drowsy  Airway & Oxygen Therapy: Patient Spontanous Breathing and Patient connected to nasal cannula oxygen  Post-op Assessment: Report given to RN and Post -op Vital signs reviewed and stable  Post vital signs: Reviewed and stable  Last Vitals:  Vitals Value Taken Time  BP 151/84 06/12/21 0948  Temp    Pulse 81 06/12/21 0950  Resp 19 06/12/21 0950  SpO2 100 % 06/12/21 0950  Vitals shown include unvalidated device data.  Last Pain:  Vitals:   06/12/21 0528  TempSrc: Oral  PainSc:          Complications: No notable events documented.

## 2021-06-12 NOTE — Anesthesia Preprocedure Evaluation (Addendum)
Anesthesia Evaluation  Patient identified by MRN, date of birth, ID band Patient awake    Reviewed: Allergy & Precautions, NPO status , Patient's Chart, lab work & pertinent test results  History of Anesthesia Complications Negative for: history of anesthetic complications  Airway Mallampati: III  TM Distance: >3 FB Neck ROM: Full    Dental  (+) Dental Advisory Given   Pulmonary shortness of breath, sleep apnea , COPD, former smoker,    breath sounds clear to auscultation       Cardiovascular hypertension, + CAD and +CHF  + dysrhythmias Atrial Fibrillation + Valvular Problems/Murmurs  Rhythm:Regular  1. Left ventricular ejection fraction, by estimation, is 20 to 25%. The  left ventricle has severely decreased function. The left ventricle  demonstrates global hypokinesis. The left ventricular internal cavity size  was moderately dilated. There is mild  left ventricular hypertrophy. Left ventricular diastolic parameters are  consistent with Grade II diastolic dysfunction (pseudonormalization).  Elevated left atrial pressure.  2. Right ventricular systolic function is normal. The right ventricular  size is normal. Tricuspid regurgitation signal is inadequate for assessing  PA pressure.  3. Left atrial size was mildly dilated.  4. The mitral valve is normal in structure. Moderate mitral valve  regurgitation.  5. The aortic valve was not well visualized. Aortic valve regurgitation  is mild to moderate. Mild to moderate aortic valve sclerosis/calcification  is present, without any evidence of aortic stenosis.  6. Aortic dilatation noted. Aneurysm of the ascending aorta, measuring 47  mm.  7. The inferior vena cava is normal in size with greater than 50%  respiratory variability, suggesting right atrial pressure of 3 mmHg.    Neuro/Psych CVA negative psych ROS   GI/Hepatic PUD, GERD  Medicated,(+) Hepatitis -  Endo/Other     Renal/GU ESRF and DialysisRenal disease     Musculoskeletal   Abdominal   Peds  Hematology  (+) Blood dyscrasia, anemia , Lab Results      Component                Value               Date                      WBC                      6.8                 06/11/2021                HGB                      10.6 (L)            06/11/2021                HCT                      32.2 (L)            06/11/2021                MCV                      107.3 (H)           06/11/2021                PLT  151                 06/11/2021           eliquis   Anesthesia Other Findings   Reproductive/Obstetrics                            Anesthesia Physical Anesthesia Plan  ASA: 4  Anesthesia Plan: General   Post-op Pain Management:    Induction: Intravenous  PONV Risk Score and Plan: 2 and Ondansetron and Dexamethasone  Airway Management Planned: Oral ETT  Additional Equipment: Arterial line  Intra-op Plan:   Post-operative Plan: Extubation in OR  Informed Consent: I have reviewed the patients History and Physical, chart, labs and discussed the procedure including the risks, benefits and alternatives for the proposed anesthesia with the patient or authorized representative who has indicated his/her understanding and acceptance.     Dental advisory given  Plan Discussed with: CRNA and Surgeon  Anesthesia Plan Comments:         Anesthesia Quick Evaluation

## 2021-06-12 NOTE — Interval H&P Note (Signed)
History and Physical Interval Note:  06/12/2021 7:39 AM  [place]  has presented today for surgery, with the diagnosis of right nondisplaced subcapital hip fracture.  The various methods of treatment have been discussed with the patient and family. After consideration of risks, benefits and other options for treatment, the patient has consented to  Procedure(s): OPEN REDUCTION AND INTERNAL FIXATION HIP (Right) as a surgical intervention.  The patient's history has been reviewed, patient examined, no change in status, stable for surgery.  I have reviewed the patient's chart and labs.  Questions were answered to the patient's satisfaction.     [name]

## 2021-06-12 NOTE — Discharge Instructions (Signed)
    Keep dressing dry. May use tub chair to shower  Call if there is odor or saturation of dressing or worsening pain not controlled with medications. Call if fever greater than 101.5. Use crutches or walker 50% weight bearing on the right leg. Please follow up with an appointment with Dr. Louanne Skye  2 weeks from the time of surgery [phone]. Elevate as often as possible during the first week after surgery gradually increasing the time the leg is dependent or down there after. If swelling recurrs then elevate again. Wheel chair for longer distances.Apply ice to the surgery site as needed to relieve pain. Take aspirin 325 mg every day with food or snack

## 2021-06-12 NOTE — Progress Notes (Signed)
David [name] PROGRESS NOTE    David [name]  BOF:[id number] DOB: Jan 20, 1941 DOA: 06/10/2021 PCP: David Alar, NP      Brief Narrative:  David [name] is a 80 y.o. M with ESRD on HD TThS, HIV, pAF on ELiquis, sCHF, anemia, hx Cardiac arrest, stroke and OSA who presented after mechanical fall.  In the ER, found to have a right hip fracture.           Assessment & Plan:  Subcapital right hip fracture -Consult Orthopedics - Resume Eliquis tomorrow/24 hours postop -Consult Palliative care - Avoid morphine - Schedule acetaminophen - As needed oxycodone hydromorphone     ESRD Interdialytic hypotension -Consult Nephrology for maintenance HD - Continue midodrine  Paroxysmal atrial fibrillation In sinus rhythm, rate is normal today - Continue amiodarone - Hold Eliquis, resume 7/11  HIV Undetectable last Feb - Continue [name]  Chronic systolic and diastolic CHF Last EF 77-82%, grade II DD.   Still appears euvolemic, asymptomatic at baseline NYHA 2.  Cerebrovascular disease, secondary prevention  Sleep apnea History of sleep apnea.  CPAP.  Pressure injury, stage II right buttocks, POA Pressure injury, scrotus, stage II POA -Consult WOC  Glaucoma -Continue eye drops  Anemia of CKD Hgb at abseline 10 Check hemoglobin tomorrow  Gout No active flare -Resume allopurionol             Disposition: Status is: Inpatient  Remains inpatient appropriate because:Unsafe d/c plan  Dispo:  Patient From: Home  Planned Disposition: Home  Medically stable for discharge: No         Level of care: Telemetry Medical       MDM: The below labs and imaging reports were reviewed and summarized above.  Medication management as above.    DVT prophylaxis: apixaban (ELIQUIS) tablet 2.5 mg Start: 06/13/21 1000 SCDs Start: 06/12/21 1045 Place TED hose Start: 06/12/21 1045 SCDs Start: 06/10/21 2229 apixaban (ELIQUIS) tablet  2.5 mg  Code Status: FULL Family Communication:         Subjective: Soreness in the right leg, no palpitations, dyspnea, chest pain, confusion..  Objective: Vitals:   06/12/21 0950 06/12/21 1005 06/12/21 1020 06/12/21 1037  BP: (!) 151/84 (!) 148/84 (!) 147/83 (!) 146/82  Pulse: 81 81 80 80  Resp: 19 19 20    Temp: 97.6 F (36.4 C)  97.6 F (36.4 C) 97.8 F (36.6 C)  TempSrc:    Oral  SpO2: 100% 100% 100% 100%  Weight:      Height:        Intake/Output Summary (Last 24 hours) at 06/12/2021 1156 Last data filed at 06/12/2021 4235 Gross per 24 hour  Intake 1046 ml  Output 1532 ml  Net -486 ml   Filed Weights   06/11/21 1230 06/11/21 1710 06/12/21 0719  Weight: 83.8 kg 82.3 kg 83.2 kg    Examination: General appearance: Elderly adult male, lying in bed, appears debilitated.  No acute distress.     HEENT: Anicteric, conjunctival pink, lids and lashes normal.  No nasal deformity, discharge, or epistaxis.  Lips moist, dentition normal, oropharynx moist, no oral lesions Skin:  Cardiac: RRR, no murmurs, I do not appreciate lower extremity edema. Respiratory: Normal respiratory rate and rhythm, lungs diminished but no rales or wheezes appreciated Abdomen: Abdomen soft without tenderness palpation or guarding. MSK:  Neuro: Awake and sleepy, extraocular movements intact, moves upper extremities with generalized weakness, leg strength test tested due to weakness and pain Psych: sleepy but responsive and oriented,  no hallucinations    Data Reviewed: I have personally reviewed following labs and imaging studies:  CBC: Recent Labs  Lab 06/10/21 1837 06/11/21 0444  WBC 7.6 6.8  NEUTROABS 6.1  --   HGB 10.7* 10.6*  HCT 32.7* 32.2*  MCV 110.1* 107.3*  PLT 160 454   Basic Metabolic Panel: Recent Labs  Lab 06/10/21 1837 06/11/21 0444  NA 137 136  K 3.7 3.8  CL 92* 93*  CO2 31 30  GLUCOSE 134* 120*  BUN 29* 32*  CREATININE 7.37* 7.96*  CALCIUM 9.3 9.2  MG  --   2.2  PHOS  --  5.4*   GFR: Estimated Creatinine Clearance: 8.7 mL/min (A) (by C-G formula based on SCr of 7.96 mg/dL (H)). Liver Function Tests: No results for input(s): AST, ALT, ALKPHOS, BILITOT, PROT, ALBUMIN in the last 168 hours. No results for input(s): LIPASE, AMYLASE in the last 168 hours. No results for input(s): AMMONIA in the last 168 hours. Coagulation Profile: Recent Labs  Lab 06/10/21 1837  INR 1.3*   Cardiac Enzymes: No results for input(s): CKTOTAL, CKMB, CKMBINDEX, TROPONINI in the last 168 hours. BNP (last 3 results) No results for input(s): PROBNP in the last 8760 hours. HbA1C: No results for input(s): HGBA1C in the last 72 hours. CBG: No results for input(s): GLUCAP in the last 168 hours. Lipid Profile: No results for input(s): CHOL, HDL, LDLCALC, TRIG, CHOLHDL, LDLDIRECT in the last 72 hours. Thyroid Function Tests: No results for input(s): TSH, T4TOTAL, FREET4, T3FREE, THYROIDAB in the last 72 hours. Anemia Panel: No results for input(s): VITAMINB12, FOLATE, FERRITIN, TIBC, IRON, RETICCTPCT in the last 72 hours. Urine analysis:    Component Value Date/Time   COLORURINE YELLOW 11/30/2020 2354   APPEARANCEUR CLOUDY (A) 11/30/2020 2354   LABSPEC 1.011 11/30/2020 2354   PHURINE 9.0 (H) 11/30/2020 2354   GLUCOSEU NEGATIVE 11/30/2020 2354   HGBUR SMALL (A) 11/30/2020 2354   BILIRUBINUR NEGATIVE 11/30/2020 2354   BILIRUBINUR negative 04/16/2013 1447   KETONESUR NEGATIVE 11/30/2020 2354   PROTEINUR 100 (A) 11/30/2020 2354   UROBILINOGEN 0.2 05/11/2014 1352   NITRITE NEGATIVE 11/30/2020 2354   LEUKOCYTESUR LARGE (A) 11/30/2020 2354   Sepsis Labs: @LABRCNTIP (procalcitonin:4,lacticacidven:4)  ) Recent Results (from the past 240 hour(s))  SARS CORONAVIRUS 2 (TAT 6-24 HRS) Nasopharyngeal Nasopharyngeal Swab     Status: None   Collection Time: 06/11/21  1:14 AM   Specimen: Nasopharyngeal Swab  Result Value Ref Range Status   SARS Coronavirus 2 NEGATIVE  NEGATIVE Final    Comment: (NOTE) SARS-CoV-2 target nucleic acids are NOT DETECTED.  The SARS-CoV-2 RNA is generally detectable in upper and lower respiratory specimens during the acute phase of infection. Negative results do not preclude SARS-CoV-2 infection, do not rule out co-infections with other pathogens, and should not be used as the sole basis for treatment or other patient management decisions. Negative results must be combined with clinical observations, patient history, and epidemiological information. The expected result is Negative.  Fact Sheet for Patients: SugarRoll.be  Fact Sheet for Healthcare Providers: https://www.woods-mathews.com/  This test is not yet approved or cleared by the [place] FDA and  has been authorized for detection and/or diagnosis of SARS-CoV-2 by FDA under an Emergency Use Authorization (EUA). This EUA will remain  in effect (meaning this test can be used) for the duration of the COVID-19 declaration under Se ction 564(b)(1) of the Act, 21 U.S.C. section 360bbb-3(b)(1), unless the authorization is terminated or revoked sooner.  Performed at  [facility], [place] [address] [place], [place] [address]   Surgical pcr screen     Status: None   Collection Time: 06/11/21 11:19 PM   Specimen: Nasal Mucosa; Nasal Swab  Result Value Ref Range Status   MRSA, PCR NEGATIVE NEGATIVE Final   Staphylococcus aureus NEGATIVE NEGATIVE Final    Comment: (NOTE) The Xpert SA Assay (FDA approved for NASAL specimens in patients 29 years of age and older), is one component of a comprehensive surveillance program. It is not intended to diagnose infection nor to guide or monitor treatment. Performed at [facility], [place] [address] [place], [place] [address]          Radiology Studies: DG Chest 1 View  Result Date: 06/10/2021 CLINICAL DATA:  Recent fall with known right hip fracture, initial  encounter EXAM: CHEST  1 VIEW COMPARISON:  11/30/2020 FINDINGS: Cardiac shadow is enlarged but stable. Left dialysis catheter has been removed in the interval. Lungs are clear without evidence of focal infiltrate or effusion. No pneumothorax is noted. No acute bony abnormality is seen. Vascular stenting is again noted right arm. IMPRESSION: Stable cardiomegaly.  No acute abnormality noted. Electronically Signed   By: Inez Catalina M.D.   On: 06/10/2021 19:42   DG C-Arm 1-60 Min  Result Date: 06/12/2021 CLINICAL DATA:  RIGHT hip pinning. EXAM: OPERATIVE RIGHT HIP (WITH PELVIS IF PERFORMED) 3 VIEWS TECHNIQUE: Fluoroscopic spot image(s) were submitted for interpretation post-operatively. COMPARISON:  Plain film of the pelvis and RIGHT hip dated 06/10/2021. FINDINGS: Intraoperative fluoroscopic images showing 3 fixation screws traversing the RIGHT femoral neck fracture site. Screws appear intact and appropriately positioned. Fluoroscopy provided for 46 seconds. IMPRESSION: Intraoperative films demonstrating screw fixation of the RIGHT femoral neck fracture. Hardware appears intact and appropriately positioned. Electronically Signed   By: Franki Cabot M.D.   On: 06/12/2021 10:14   DG HIP OPERATIVE UNILAT WITH PELVIS RIGHT  Result Date: 06/12/2021 CLINICAL DATA:  RIGHT hip pinning. EXAM: OPERATIVE RIGHT HIP (WITH PELVIS IF PERFORMED) 3 VIEWS TECHNIQUE: Fluoroscopic spot image(s) were submitted for interpretation post-operatively. COMPARISON:  Plain film of the pelvis and RIGHT hip dated 06/10/2021. FINDINGS: Intraoperative fluoroscopic images showing 3 fixation screws traversing the RIGHT femoral neck fracture site. Screws appear intact and appropriately positioned. Fluoroscopy provided for 46 seconds. IMPRESSION: Intraoperative films demonstrating screw fixation of the RIGHT femoral neck fracture. Hardware appears intact and appropriately positioned. Electronically Signed   By: Franki Cabot M.D.   On: 06/12/2021  10:14   DG Hip Unilat With Pelvis 2-3 Views Right  Result Date: 06/10/2021 CLINICAL DATA:  Recent fall with right hip pain, initial encounter EXAM: DG HIP (WITH OR WITHOUT PELVIS) 3V RIGHT COMPARISON:  12/15/2020 FINDINGS: There are changes consistent with an undisplaced subcapital femoral neck fracture on the right. Very mild impaction at the fracture site is noted. These changes are new from the prior exam. Pelvic ring is intact. 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 Prostate seeds are again seen. IMPRESSION: Subcapital right femoral neck fracture with very mild impaction at the fracture site. Electronically Signed   By: Inez Catalina M.D.   On: 06/10/2021 19:41        Scheduled Meds:  acetaminophen  500 mg Oral Q6H   [START ON 06/13/2021] allopurinol  100 mg Oral Once per day on Mon Tue Thu Sat   amiodarone  200 mg Oral Daily   [START ON 06/13/2021] apixaban  2.5 mg Oral BID  bictegravir-emtricitabine-tenofovir AF  1 tablet Oral Daily   brimonidine  1 drop Right Eye TID   Chlorhexidine Gluconate Cloth  6 each Topical Daily   docusate sodium  100 mg Oral BID   dorzolamide-timolol  1 drop Right Eye BID   midodrine  10 mg Oral 2 times per day on Mon Tue Thu Sat   montelukast  10 mg Oral QHS   multivitamin  1 tablet Oral QPM   ofloxacin  1 drop Right Eye QID   pantoprazole  40 mg Oral Daily   senna-docusate  2 tablet Oral QHS   Continuous Infusions:  sodium chloride     [START ON 06/13/2021] sodium chloride      ceFAZolin (ANCEF) IV     promethazine (PHENERGAN) injection (IM or IVPB)       LOS: 2 days    Time spent: 35-minute    Edwin Dada, MD Triad [name] 06/12/2021, 11:56 AM     Please page though Shea Evans or Epic secure chat:  For [employer], Adult nurse

## 2021-06-12 NOTE — Anesthesia Procedure Notes (Signed)
Procedure Name: Intubation Date/Time: 06/12/2021 8:13 AM Performed by: Clearnce Sorrel, CRNA Pre-anesthesia Checklist: Patient identified, Emergency Drugs available, Suction available and Patient being monitored Patient Re-evaluated:Patient Re-evaluated prior to induction Oxygen Delivery Method: Circle System Utilized Preoxygenation: Pre-oxygenation with 100% oxygen Induction Type: IV induction Ventilation: Mask ventilation without difficulty Laryngoscope Size: 4 and Mac Grade View: Grade II Tube type: Oral Tube size: 7.5 mm Number of attempts: 2 Airway Equipment and Method: Stylet and Oral airway Placement Confirmation: ETT inserted through vocal cords under direct vision, positive ETCO2 and breath sounds checked- equal and bilateral Secured at: 24 cm Tube secured with: Tape Dental Injury: Teeth and Oropharynx as per pre-operative assessment

## 2021-06-12 NOTE — Op Note (Signed)
06/12/2021  9:45 AM  PATIENT:  David [name]  80 y.o. male  MRN: [id number]  OPERATIVE REPORT  PRE-OPERATIVE DIAGNOSIS:  right nondisplaced subcapital hip fracture  POST-OPERATIVE DIAGNOSIS:  right nondisplaced subcapital hip fracture  PROCEDURE:  Procedure(s): CANNULATED RIGHT HIP PINNING    SURGEON:  Jessy Oto, MD   EBL 50CC  ANESTHESIA:  General,    COMPLICATIONS:  None.     COMPONENTS:  Implant Name Type Inv. Item Serial No. Manufacturer Lot No. LRB No. Used Action  SCREW CANN 85MM - [id number] Screw SCREW CANN 85MM  [employer]  Right 1 Implanted  SCREW CANN 6.5X105 16MM THRD - [id number] Screw SCREW CANN 6.5X105 16MM THRD  [employer]  Right 1 Implanted  6.5MM X 16MM X 95MM SCREW Screw   [employer]  Right 1 Implanted     PROCEDURE: The patient was met in the holding area, and the appropriate right hip identified and marked with an "X" and my initials.  The patient was then transported to OR and was placed on the operative Jackson fracture table in a supine position. The patient was then placed under general anesthesia without difficulty. The patient received appropriate preoperative antibiotic prophylaxis of ancef 2 g. Left  leg was placed in a well leg holder. Right leg placed in a left foot boot, but no significant longitudinal traction was applied and the foot in approximately 15 of internal rotation. Right groin post was used.The right leg was then prepped using sterile conditions and draped using sterile technique. An iodine exclusion Vi-Drape was used. Time-out procedure was called and correct .  C-arm fluoroscopy was then brought into the field. Under C-arm fluoroscopy right hip was examined and the site for incision marked with Kelly clamp. Incision was made along the lateral aspect of the femur in line with the proximal femur just distal to the greater trochanter.  Incision approximately 3 inches in length. Subcutaneous layers then incised  to the ITB. Incision was then carried in line with the iliotibial band this layers spread with Weitlander. The vastus lateralis was then incised after retraction anteriorly and blunt dissection used to expose the lateral aspect of the femur at the gradual curve from the distal portion of the trochanter laterally. Multiple guidepins guide was then inserted and the first guide pin placed into the superior and midportion of the femoral neck and head on AP and lateral views. This was placed down to just short of subchondral bone about 2 or 3 mm on C-arm fluoroscopy in AP lateral planes in good position alignment area using this as a reference then 2 more pins were placed creating a triangle fixation the pins were placed as close to the cortex up the femoral neck is possible to obtain better purchase. Total of 3 guide pins were placed one superior and in the midportion head on the AP and lateral views respectively. The second placed in the inferior and posterior aspect of the neck was placed in the inferior and anterior aspect of the neck. Each of these were then measured for length the superficial cortex and overdrilled was taken to ensure that each of the guidepins were placed short of subchondral bone and there was no penetration of the joint. The knife then the superior most aspect measured at about 100 mm a 95 mm cannulated screw was chosen was then placed over the guidepin and using power placed within a few turns of the lateral cortex then a T-handle was used to seat  the screw obtaining excellent purchase.  C-arm fluoroscopy used to check the alignment and position screws appear to be well placed by penetration area next then the second screw was placed in the posterior inferior position measuring length using a 85 mm length screw over drilling the lateral cortex and then placing the screw within 3-4 mm of subchondral bone. The shorter screw length were used ensuring no penetration finding the anterior inferior  screw was placed again measuring depth of 125m and 105 mm over drilling the lateral cortex and plantar the cannulated screw within several turned to the lateral cortex and placing in and obtaining excellent purchase on all 3 screws. The guidepins were removed and permanent documentation in AP and lateral planes obtained and using C-arm fluoroscopy care for rotation of the femur demonstrated all screws to be in good position alignment without evidence of penetration.  Following further irrigation and the 3 inch incision was closed first closing the vastus lateralis with a interrupted #1 Vicryl suture then the tensor fascia lata with interrupted 0 Vicryl sutures subcutaneous layers with interrupted       2-0 Vicryl and a running subcutaneous stitch of 4-0 Vicryl. Steristrip were applied then MedPlex bandage. Patient was then returned to his bed reactivated extubated and returned to recovery room in satisfactory condition all instrument sponge counts were correct. Note that intraoperative C-arm images were obtained their quality was not sufficient for documentation so that permanent radiographs were obtained in the recovery room     JBasil Dess 06/12/2021, 9:45 AM

## 2021-06-12 NOTE — Brief Op Note (Signed)
06/12/2021  9:42 AM  PATIENT:  David [name]  80 y.o. male  PRE-OPERATIVE DIAGNOSIS:  right nondisplaced subcapital hip fracture  POST-OPERATIVE DIAGNOSIS:  right nondisplaced subcapital hip fracture  PROCEDURE:  Procedure(s): CANNULATED RIGHT HIP PINNING (Right)  SURGEON:  Surgeon(s) and Role:    * Jessy Oto, MD - Primary  ANESTHESIA:   local and general, Dr. Ermalene Postin.  EBL:  50CC   BLOOD ADMINISTERED:none  DRAINS:  Male peerwick.    LOCAL MEDICATIONS USED:  MARCAINE 0.25% Amount: 15 ml  SPECIMEN:  No Specimen  DISPOSITION OF SPECIMEN:  N/A  COUNTS:  YES  TOURNIQUET:  * No tourniquets in log *  DICTATION: .Dragon Dictation  PLAN OF CARE: Admit to inpatient   PATIENT DISPOSITION:  PACU - hemodynamically stable.   Delay start of Pharmacological VTE agent (>24hrs) due to surgical blood loss or risk of bleeding: yes

## 2021-06-12 NOTE — Anesthesia Postprocedure Evaluation (Signed)
Anesthesia Post Note  Patient: [name]  Procedure(s) Performed: CANNULATED RIGHT HIP PINNING (Right: Hip)     Patient location during evaluation: PACU Anesthesia Type: General Level of consciousness: awake and alert Pain management: pain level controlled Vital Signs Assessment: post-procedure vital signs reviewed and stable Respiratory status: spontaneous breathing, nonlabored ventilation, respiratory function stable and patient connected to nasal cannula oxygen Cardiovascular status: blood pressure returned to baseline and stable Postop Assessment: no apparent nausea or vomiting Anesthetic complications: no   No notable events documented.  Last Vitals:  Vitals:   06/12/21 1037 06/12/21 1327  BP: (!) 146/82 (!) 143/79  Pulse: 80 86  Resp: (!) 22 18  Temp: 36.6 C   SpO2: 100% 100%    Last Pain:  Vitals:   06/12/21 1037  TempSrc: Oral  PainSc:                  David [name]

## 2021-06-12 NOTE — Progress Notes (Signed)
[facility] Progress Note    Assessment/ Plan:   Right Subcapital Hip Fracture-Ortho consulted-s/p right hip pinning earlier today ESRD - on HD 4x weekly Mon/Tues/Thurs/Sat d/t frequent hospitalizations for volume overload. Reviewed OP HD records. Patient has been reaching EDW. Plan for HD tuesday Also plan for patient to receive HD on TTS schedule while here in hospital-d/t staffing patterns we currently have on HD unit. We will certainly provide extra HD treatments if clinically indicated. Plan to re-evaluate patient tomorrow on volume status and  decide from there Hypertension/volume  - Blood pressures elevated-noted patient cutting previous HD treatments short in outpatient-I suspect this is due to volume. Plan for HD today to remove fluid. Patient also on Midodrine on HD days for low blood pressures in outpatient-resume here and monitor BP trends while on HD. Continue to closely monitor volume status.  Anemia of CKD - Hgb 10.6-ESA not due yet-continue to monitor trends.  Secondary Hyperparathyroidism - Ca and PO4 okay-continue binder  Nutrition - Continue renal diet with fluid restriction.  Subjective:   S/p rt hip pinning earlier this am. Patient reports that pain is controlled. Feels sleepy. He does not feel like he has a lot of fluid on, feels okay to not do HD tomorrow.   Objective:   BP (!) 143/79 (BP Location: Left Arm)   Pulse 86   Temp 97.8 F (36.6 C) (Oral)   Resp 18   Ht 6\' 2"  (1.88 m)   Wt 83.2 kg   SpO2 100%   BMI 23.55 kg/m   Intake/Output Summary (Last 24 hours) at 06/12/2021 1644 Last data filed at 06/12/2021 1400 Gross per 24 hour  Intake 1137.65 ml  Output 25 ml  Net 1112.65 ml   Weight change: -0.6 kg  Physical Exam: Gen:nad/fatigued CVS:s1s2 Resp:normal wob DJS:HFWY Ext:no edema Neuro: fatigued but alert and oriented  Imaging: DG Chest 1 View  Result Date: 06/10/2021 CLINICAL DATA:  Recent fall with known right hip fracture,  initial encounter EXAM: CHEST  1 VIEW COMPARISON:  11/30/2020 FINDINGS: Cardiac shadow is enlarged but stable. Left dialysis catheter has been removed in the interval. Lungs are clear without evidence of focal infiltrate or effusion. No pneumothorax is noted. No acute bony abnormality is seen. Vascular stenting is again noted right arm. IMPRESSION: Stable cardiomegaly.  No acute abnormality noted. Electronically Signed   By: Inez Catalina M.D.   On: 06/10/2021 19:42   DG C-Arm 1-60 Min  Result Date: 06/12/2021 CLINICAL DATA:  RIGHT hip pinning. EXAM: OPERATIVE RIGHT HIP (WITH PELVIS IF PERFORMED) 3 VIEWS TECHNIQUE: Fluoroscopic spot image(s) were submitted for interpretation post-operatively. COMPARISON:  Plain film of the pelvis and RIGHT hip dated 06/10/2021. FINDINGS: Intraoperative fluoroscopic images showing 3 fixation screws traversing the RIGHT femoral neck fracture site. Screws appear intact and appropriately positioned. Fluoroscopy provided for 46 seconds. IMPRESSION: Intraoperative films demonstrating screw fixation of the RIGHT femoral neck fracture. Hardware appears intact and appropriately positioned. Electronically Signed   By: Franki Cabot M.D.   On: 06/12/2021 10:14   DG HIP OPERATIVE UNILAT WITH PELVIS RIGHT  Result Date: 06/12/2021 CLINICAL DATA:  RIGHT hip pinning. EXAM: OPERATIVE RIGHT HIP (WITH PELVIS IF PERFORMED) 3 VIEWS TECHNIQUE: Fluoroscopic spot image(s) were submitted for interpretation post-operatively. COMPARISON:  Plain film of the pelvis and RIGHT hip dated 06/10/2021. FINDINGS: Intraoperative fluoroscopic images showing 3 fixation screws traversing the RIGHT femoral neck fracture site. Screws appear intact and appropriately positioned. Fluoroscopy provided for 46 seconds. IMPRESSION: Intraoperative films demonstrating  screw fixation of the RIGHT femoral neck fracture. Hardware appears intact and appropriately positioned. Electronically Signed   By: Franki Cabot M.D.   On:  06/12/2021 10:14   DG Hip Unilat With Pelvis 2-3 Views Right  Result Date: 06/10/2021 CLINICAL DATA:  Recent fall with right hip pain, initial encounter EXAM: DG HIP (WITH OR WITHOUT PELVIS) 3V RIGHT COMPARISON:  12/15/2020 FINDINGS: There are changes consistent with an undisplaced subcapital femoral neck fracture on the right. Very mild impaction at the fracture site is noted. These changes are new from the prior exam. Pelvic ring is intact. 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 Prostate seeds are again seen. IMPRESSION: Subcapital right femoral neck fracture with very mild impaction at the fracture site. Electronically Signed   By: Inez Catalina M.D.   On: 06/10/2021 19:41    Labs: BMET Recent Labs  Lab 06/10/21 1837 06/11/21 0444  NA 137 136  K 3.7 3.8  CL 92* 93*  CO2 31 30  GLUCOSE 134* 120*  BUN 29* 32*  CREATININE 7.37* 7.96*  CALCIUM 9.3 9.2  PHOS  --  5.4*   CBC Recent Labs  Lab 06/10/21 1837 06/11/21 0444  WBC 7.6 6.8  NEUTROABS 6.1  --   HGB 10.7* 10.6*  HCT 32.7* 32.2*  MCV 110.1* 107.3*  PLT 160 151    Medications:     [START ON 06/13/2021] acetaminophen  1,000 mg Oral TID   [START ON 06/14/2021] allopurinol  100 mg Oral Once per day on Tue Thu Sat   amiodarone  200 mg Oral Daily   [START ON 06/13/2021] apixaban  2.5 mg Oral BID   bictegravir-emtricitabine-tenofovir AF  1 tablet Oral Daily   brimonidine  1 drop Right Eye TID   Chlorhexidine Gluconate Cloth  6 each Topical Daily   docusate sodium  100 mg Oral BID   dorzolamide-timolol  1 drop Right Eye BID   [START ON 06/14/2021] midodrine  10 mg Oral 2 times per day on Tue Thu Sat   montelukast  10 mg Oral QHS   multivitamin  1 tablet Oral QPM   ofloxacin  1 drop Right Eye QID   pantoprazole  40 mg Oral Daily   senna-docusate  2 tablet Oral QHS      Gean Quint, MD [facility] 06/12/2021, 4:44 PM

## 2021-06-13 ENCOUNTER — Encounter (HOSPITAL_COMMUNITY): Payer: Self-pay | Admitting: Internal Medicine

## 2021-06-13 DIAGNOSIS — Z8781 Personal history of (healed) traumatic fracture: Secondary | ICD-10-CM

## 2021-06-13 DIAGNOSIS — Z515 Encounter for palliative care: Secondary | ICD-10-CM

## 2021-06-13 DIAGNOSIS — Z7189 Other specified counseling: Secondary | ICD-10-CM

## 2021-06-13 DIAGNOSIS — N186 End stage renal disease: Secondary | ICD-10-CM

## 2021-06-13 LAB — BASIC METABOLIC PANEL
Anion gap: 13 (ref 5–15)
BUN: 51 mg/dL — ABNORMAL HIGH (ref 8–23)
CO2: 27 mmol/L (ref 22–32)
Calcium: 9.1 mg/dL (ref 8.9–10.3)
Chloride: 98 mmol/L (ref 98–111)
Creatinine, Ser: 7.66 mg/dL — ABNORMAL HIGH (ref 0.61–1.24)
GFR, Estimated: 7 mL/min — ABNORMAL LOW (ref >60)
Glucose, Bld: 135 mg/dL — ABNORMAL HIGH (ref 70–99)
Potassium: 4.7 mmol/L (ref 3.5–5.1)
Sodium: 138 mmol/L (ref 135–145)

## 2021-06-13 LAB — CBC
HCT: 30.5 % — ABNORMAL LOW (ref 39.0–52.0)
Hemoglobin: 10.1 g/dL — ABNORMAL LOW (ref 13.0–17.0)
MCH: 36.1 pg — ABNORMAL HIGH (ref 26.0–34.0)
MCHC: 33.1 g/dL (ref 30.0–36.0)
MCV: 108.9 fL — ABNORMAL HIGH (ref 80.0–100.0)
Platelets: 132 10*3/uL — ABNORMAL LOW (ref 150–400)
RBC: 2.8 MIL/uL — ABNORMAL LOW (ref 4.22–5.81)
RDW: 16.1 % — ABNORMAL HIGH (ref 11.5–15.5)
WBC: 8.8 10*3/uL (ref 4.0–10.5)
nRBC: 0 % (ref 0.0–0.2)

## 2021-06-13 LAB — HEPATIC FUNCTION PANEL
ALT: 20 U/L (ref 0–44)
AST: 16 U/L (ref 15–41)
Albumin: 3.2 g/dL — ABNORMAL LOW (ref 3.5–5.0)
Alkaline Phosphatase: 125 U/L (ref 38–126)
Bilirubin, Direct: 0.2 mg/dL (ref 0.0–0.2)
Indirect Bilirubin: 0.6 mg/dL (ref 0.3–0.9)
Total Bilirubin: 0.8 mg/dL (ref 0.3–1.2)
Total Protein: 6.5 g/dL (ref 6.5–8.1)

## 2021-06-13 MED ORDER — CHLORHEXIDINE GLUCONATE CLOTH 2 % EX PADS
6.0000 | MEDICATED_PAD | Freq: Every day | CUTANEOUS | Status: DC
  Administered 2021-06-14 – 2021-06-17 (×4): 6 via TOPICAL

## 2021-06-13 NOTE — Progress Notes (Signed)
RT placed patient on CPAP with 2L O2 bled into circuit. Patient tolerating CPAP well at this time. RT will monitor as needed.

## 2021-06-13 NOTE — Progress Notes (Signed)
[facility] Progress Note   Subjective:   Patient seen and examined at bedside.  Overall doing ok.  Denies pain, CP, SOB, n/v/d, abdominal pain, weakness and fatigue.   Objective Vitals:   06/12/21 2130 06/13/21 0100 06/13/21 0449 06/13/21 0936  BP: 113/67 105/67 121/73 130/75  Pulse: 75 76 77 83  Resp: 17  18 (!) 21  Temp: (!) 97.5 F (36.4 C) 97.9 F (36.6 C) 97.7 F (36.5 C) 97.8 F (36.6 C)  TempSrc: Oral Oral Oral Oral  SpO2: 100% 100% 100% 96%  Weight:      Height:       Physical Exam General: well appearing, elderly male in NAD Heart:RRR, no mrg Lungs:CTAB, nml WOB on RA Abdomen:soft, NTND Extremities:no LE edema Dialysis Access: RU AVG +b/t   Filed Weights   06/11/21 1230 06/11/21 1710 06/12/21 0719  Weight: 83.8 kg 82.3 kg 83.2 kg    Intake/Output Summary (Last 24 hours) at 06/13/2021 1244 Last data filed at 06/13/2021 0900 Gross per 24 hour  Intake 1041.1 ml  Output --  Net 1041.1 ml    Additional Objective Labs: Basic Metabolic Panel: Recent Labs  Lab 06/10/21 1837 06/11/21 0444 06/13/21 0142  NA 137 136 138  K 3.7 3.8 4.7  CL 92* 93* 98  CO2 31 30 27   GLUCOSE 134* 120* 135*  BUN 29* 32* 51*  CREATININE 7.37* 7.96* 7.66*  CALCIUM 9.3 9.2 9.1  PHOS  --  5.4*  --    Liver Function Tests: Recent Labs  Lab 06/13/21 0142  AST 16  ALT 20  ALKPHOS 125  BILITOT 0.8  PROT 6.5  ALBUMIN 3.2*   CBC: Recent Labs  Lab 06/10/21 1837 06/11/21 0444 06/13/21 0142  WBC 7.6 6.8 8.8  NEUTROABS 6.1  --   --   HGB 10.7* 10.6* 10.1*  HCT 32.7* 32.2* 30.5*  MCV 110.1* 107.3* 108.9*  PLT 160 151 132*   Studies/Results: DG C-Arm 1-60 Min  Result Date: 06/12/2021 CLINICAL DATA:  RIGHT hip pinning. EXAM: OPERATIVE RIGHT HIP (WITH PELVIS IF PERFORMED) 3 VIEWS TECHNIQUE: Fluoroscopic spot image(s) were submitted for interpretation post-operatively. COMPARISON:  Plain film of the pelvis and RIGHT hip dated 06/10/2021. FINDINGS:  Intraoperative fluoroscopic images showing 3 fixation screws traversing the RIGHT femoral neck fracture site. Screws appear intact and appropriately positioned. Fluoroscopy provided for 46 seconds. IMPRESSION: Intraoperative films demonstrating screw fixation of the RIGHT femoral neck fracture. Hardware appears intact and appropriately positioned. Electronically Signed   By: Franki Cabot M.D.   On: 06/12/2021 10:14   DG HIP OPERATIVE UNILAT WITH PELVIS RIGHT  Result Date: 06/12/2021 CLINICAL DATA:  RIGHT hip pinning. EXAM: OPERATIVE RIGHT HIP (WITH PELVIS IF PERFORMED) 3 VIEWS TECHNIQUE: Fluoroscopic spot image(s) were submitted for interpretation post-operatively. COMPARISON:  Plain film of the pelvis and RIGHT hip dated 06/10/2021. FINDINGS: Intraoperative fluoroscopic images showing 3 fixation screws traversing the RIGHT femoral neck fracture site. Screws appear intact and appropriately positioned. Fluoroscopy provided for 46 seconds. IMPRESSION: Intraoperative films demonstrating screw fixation of the RIGHT femoral neck fracture. Hardware appears intact and appropriately positioned. Electronically Signed   By: Franki Cabot M.D.   On: 06/12/2021 10:14    Medications:  sodium chloride 10 mL/hr at 06/12/21 1323   sodium chloride 10 mL/hr at 06/12/21 1318   promethazine (PHENERGAN) injection (IM or IVPB)      acetaminophen  1,000 mg Oral TID   [START ON 06/14/2021] allopurinol  100 mg Oral Once per day  on Tue Thu Sat   amiodarone  200 mg Oral Daily   apixaban  2.5 mg Oral BID   bictegravir-emtricitabine-tenofovir AF  1 tablet Oral Daily   brimonidine  1 drop Right Eye TID   Chlorhexidine Gluconate Cloth  6 each Topical Daily   docusate sodium  100 mg Oral BID   dorzolamide-timolol  1 drop Right Eye BID   [START ON 06/14/2021] midodrine  10 mg Oral 2 times per day on Tue Thu Sat   montelukast  10 mg Oral QHS   multivitamin  1 tablet Oral QPM   ofloxacin  1 drop Right Eye QID   pantoprazole  40  mg Oral Daily   senna-docusate  2 tablet Oral QHS    Dialysis Orders: Mon/Tues/Thurs/Sat - [facility]  4hrs, BFR 400, DFR 1.5 AutoFlow,  EDW 84kg, 2K/ 2Ca   Heparin 2000 unit bolus Q HD Mircera 75 mcg q2wks - last 06/02/21 Venofer 50mg  IV weekly-last 06/07/21 Hectorol 11mcg IV qHD-last 06/09/21  Home meds: Velphoro 500mg  chewable 1 tab TID with meals and BID PRN with snacks Midodrine 10mg  TID on HD days (Mon/Tues/Thurs/Sat)  Assessment/Plan: Right Subcapital Hip Fracture- s/p right hip pinning on 06/12/21. ESRD - on HD 4x weekly Mon/Tues/Thurs/Sat d/t frequent hospitalizations for volume overload and recently meeting EDW.  Volume status currently stable. Plan for HD on TTS schedule while admitted.  Orders written for HD tomorrow. Hold heparin x1 week for recent surgery. K 4.7.  Hypertension/volume  - Blood pressures in goal today.  On midodrine for hypotension with HD. Does not appear volume overloaded.  Slightly under edw yesterday if weights correct.  UF as tolerated.    Anemia of CKD - Hgb 10.1 -ESA not 06/16/21 -continue to follow.   Secondary Hyperparathyroidism - Last Ca and phos in goal.  Continue VDRA and binders.   Nutrition - Renal diet with fluid restriction. Protein supplements.  Chronic systolic/diastolic HF HIV A fib   Jen Mow, PA-C [facility] 06/13/2021,12:44 PM  LOS: 3 days

## 2021-06-13 NOTE — Consult Note (Signed)
Consultation Note Date: 06/13/2021   Patient Name: [name]  DOB: Apr 13, 1941  MRN: [id number]  Age / Sex: 80 y.o., male  PCP: [name], NP Referring Physician: Edwin Dada, *  Reason for Consultation: Establishing goals of care  HPI/Patient Profile: 80 y.o. male  with past medical history of ESRD on HD, HIV, nonischemic cardiomyopathy, HTN/HLD, P A. fib on Eliquis, arthritis, history of cardiac arrest in 2019, gout, hepatitis, sleep apnea not using CPAP, history of prostate cancer radioactive seed 2010, admitted on 06/10/2021 with acute right hip fracture after fall, ESRD on HD.   Clinical Assessment and Goals of Care: I have reviewed medical records including EPIC notes, labs and imaging, received report from RN, assessed the patient.  [name] is lying quietly in bed.  He greets me making and mostly keeping eye contact.  He is alert and oriented, able to make his basic needs known.  There is no family at bedside at this time.  We meet to discuss diagnosis prognosis, GOC, EOL wishes, disposition and options.  I introduced Palliative Medicine as specialized medical care for people living with serious illness. It focuses on providing relief from the symptoms and stress of a serious illness. The goal is to improve quality of life for both the patient and the family.  We talked about his acute health concern, his hip fracture and repair.  He tells me that he is not having pain unless he tries to raise his leg.  He tells me that he is agreeable to short-term rehab with the ultimate goal of returning to his own home.  He tells me that he lives in his own home with his wife David [name].  He states that her health is good, she still drives and gets groceries.  He tells me that he does have some assistance from his wife with dressing.    Advanced directives, concepts specific to code status, were  considered and discussed.  We talked about the concept of "treat the treatable, but allowing natural passing".  I share that although he is stable at this time, it is important that we find out what he does and does not want.  Palliative Care services outpatient were explained and offered.  At this point, [name] is unsure if he would accept outpatient palliative services.  Discussed the importance of continued conversation with family and the medical providers regarding overall plan of care and treatment options, ensuring decisions are within the context of the patient's values and GOCs.  Questions and concerns were addressed.   The family was encouraged to call with questions or concerns.  PMT will continue to support holistically.  Conference with attending, bedside nursing staff, transition of care team related to patient condition, needs, goals of care.  HCPOA   NEXT OF KIN -[name] names his wife, [name], as his healthcare surrogate.    SUMMARY OF RECOMMENDATIONS   At this point continue to treat the treatable Agreeable to short-term rehab but no preference of facility. Considering  outpatient palliative services Ultimate goal is to return to his own home.   Code Status/Advance Care Planning: Full code -we talked about the concept of "treat the treatable but allowing natural passing.  I encourage [name] that he looks stable at this point, but this is respect for person to ask about what matters for him.  Symptom Management:  Per hospitalist/orthopedist, no additional needs at this time.  Palliative Prophylaxis:  Frequent Pain Assessment, Palliative Wound Care, and Turn Reposition  Additional Recommendations (Limitations, Scope, Preferences): Full Scope Treatment  Psycho-social/Spiritual:  Desire for further Chaplaincy support:no Additional Recommendations: Caregiving  Support/Resources  Prognosis:  Unable to determine, based on outcomes.  1 year or more would not  be surprising based on current functional status, chronic illness burden.  Discharge Planning:  Anticipate discharge to short-term rehab after hip fracture.       Primary Diagnoses: Present on Admission: **None**   I have reviewed the medical record, interviewed the patient and family, and examined the patient. The following aspects are pertinent.  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   Acute on chronic systolic and diastolic heart failure, NYHA class 4 (HCC)    Anemia, iron deficiency 11/15/2011   Arthritis    "hands, right knee, feet" (02/21/2018)   Atrial fibrillation (HCC)    Cardiac arrest ([place]) 02/17/2018   CHF (congestive heart failure) (HCC)    Chronic combined systolic and diastolic heart failure, NYHA class 3 ([place]) 06/2010   felt to be secondary to hypertensive cardiomyopathy   Chronic lower back pain    CKD (chronic kidney disease) stage V requiring chronic dialysis ([place])    COVID-19 12/2019   ESRD on dialysis ([place])    started 02/2018 [place]   GERD (gastroesophageal reflux disease)    Glaucoma    "I had surgery and dont have it any more"   Gout    daily RX (02/21/2018)   Heart murmur    "mild" per pt   Hepatitis    years ago   HIV (human immunodeficiency virus infection) ([place])    HIV infection ([place])    Hyperlipidemia    Hypertension    Nonischemic cardiomyopathy ([place])    Pneumonia 11/2017   Prostate cancer ([place])    hx of prostate; s/p radioactive seed implant 10/2009 Dr Janice Norrie   Sinus bradycardia    Sleep apnea    does not use a cpap   Stroke ([place])    "mini stroke" years ago   Wears glasses    Social History   Socioeconomic History   Marital status: Married    Spouse name: Not on file   Number of children: 2   Years of education: 12   Highest education level: Not on file  Occupational History   Occupation: retired, picks up Aeronautical engineer: RETIRED  Tobacco Use    Smoking status: Former    Packs/day: 1.00    Years: 30.00    Pack years: 30.00    Types: Cigarettes    Quit date: 2006    Years since quitting: 16.5   Smokeless tobacco: Never  Vaping Use   Vaping Use: Never used  Substance and Sexual Activity   Alcohol use: Yes    Alcohol/week: 1.0 standard drink    Types: 1 Shots of liquor per week    Comment: occasional   Drug use: Never   Sexual activity: Not Currently    Partners: Female  Comment: declined condoms 09/2019  Other Topics Concern   Not on file  Social History Narrative   Retired, 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   Social Determinants of Health   Financial Resource Strain: Low Risk    Difficulty of Paying Living Expenses: Not hard at all  Food Insecurity: No Food Insecurity   Worried About Charity fundraiser in the Last Year: Never true   [place] in the Last Year: Never true  Transportation Needs: No Transportation Needs   Lack of Transportation (Medical): No   Lack of Transportation (Non-Medical): No  Physical Activity: Not on file  Stress: Not on file  Social Connections: Not on file   Family History  Problem Relation Age of Onset   Hypertension Mother    Thyroid disease Mother    Cholelithiasis Daughter    Cholelithiasis Son    Hypertension Maternal Grandmother    Diabetes Maternal Grandmother    Heart attack Neg Hx    Hyperlipidemia Neg Hx    Scheduled Meds:  acetaminophen  1,000 mg Oral TID   [START ON 06/14/2021] allopurinol  100 mg Oral Once per day on Tue Thu Sat   amiodarone  200 mg Oral Daily   apixaban  2.5 mg Oral BID   bictegravir-emtricitabine-tenofovir AF  1 tablet Oral Daily   brimonidine  1 drop Right Eye TID   Chlorhexidine Gluconate Cloth  6 each Topical Daily   [START ON 06/14/2021] Chlorhexidine Gluconate Cloth  6 each  Topical Q0600   docusate sodium  100 mg Oral BID   dorzolamide-timolol  1 drop Right Eye BID   [START ON 06/14/2021] midodrine  10 mg Oral 2 times per day on Tue Thu Sat   montelukast  10 mg Oral QHS   multivitamin  1 tablet Oral QPM   ofloxacin  1 drop Right Eye QID   pantoprazole  40 mg Oral Daily   senna-docusate  2 tablet Oral QHS   Continuous Infusions:  sodium chloride 10 mL/hr at 06/12/21 1323   sodium chloride 10 mL/hr at 06/12/21 1318   promethazine (PHENERGAN) injection (IM or IVPB)     PRN Meds:.HYDROmorphone (DILAUDID) injection, labetalol, menthol-cetylpyridinium **OR** phenol, metoCLOPramide **OR** metoCLOPramide (REGLAN) injection, naLOXone (NARCAN)  injection, ondansetron (ZOFRAN) IV, oxyCODONE, polyethylene glycol, promethazine (PHENERGAN) injection (IM or IVPB), sodium phosphate, sorbitol, sucroferric oxyhydroxide Medications Prior to Admission:  Prior to Admission medications   Medication Sig Start Date End Date Taking? Authorizing Provider  acetaminophen (TYLENOL) 500 MG tablet Take 1,000 mg by mouth every 6 (six) hours as needed for moderate pain.   Yes [provider]  allopurinol (ZYLOPRIM) 100 MG tablet TAKE 1 TABLET BY MOUTH 4 TIMES WEEKLY( MONDAY, [place]) AFTER DIALYSIS Patient taking differently: Take 100 mg by mouth See admin instructions. TAKE 1 TABLET BY MOUTH 4 TIMES WEEKLY( MONDAY, TUESDAY,TH[place] AND SATURDAY) AFTER DIALYSIS 10/20/20  Yes Croitoru, Mihai, MD  amiodarone (PACERONE) 200 MG tablet TAKE 1 TABLET(200 MG) BY MOUTH DAILY Patient taking differently: Take 200 mg by mouth daily. 10/20/20  Yes Croitoru, Mihai, MD  apixaban (ELIQUIS) 5 MG TABS tablet TAKE 1 TABLET(5 MG) BY MOUTH TWICE DAILY Patient taking differently: Take 5 mg by mouth 2 (two)  times daily. 03/29/21  Yes Croitoru, Mihai, MD  [name] 50-200-25 MG TABS tablet TAKE 1 TABLET BY MOUTH DAILY 04/06/21  Yes Tommy Medal, Lavell Islam, MD  brimonidine (ALPHAGAN P) 0.1 %  SOLN Place 1 drop into the right eye 2 (two) times daily.   Yes [provider]  dorzolamide-timolol (COSOPT) 22.3-6.8 MG/ML ophthalmic solution Place 1 drop into the right eye 2 (two) times daily.   Yes [provider]  Doxercalciferol (HECTOROL IV) Dialysis days Monday,Tuesday,Thursday and saturday 04/26/21 04/25/22 Yes [provider]  heparin 1000 unit/mL SOLN injection Heparin Sodium (Porcine) 1,000 Units/mL Catheter Lock Arterial 08/21/20 08/20/21 Yes [provider]  Methoxy PEG-Epoetin Beta (MIRCERA IJ) Dialysis days Monday,Tuesday,Thursday and saturday 12/16/20 04/20/22 Yes [provider]  midodrine (PROAMATINE) 10 MG tablet Take 1 tablet (10 mg total) by mouth as directed. Twice a day on the day of HD Tuesday, Thursday, Saturday (1 in AM and 1 at [place]). Take 1/2 tablet (5mg ) a day on non HD days Patient taking differently: Take 10-20 mg by mouth See admin instructions. Take 10 mg in the morning at 5 AM and  10 mg half way through treatment on dialysis days (Mon, Tues, [place], and Sat) 04/22/19  Yes Leonie Man, MD  montelukast (SINGULAIR) 10 MG tablet TAKE 1 TABLET(10 MG) BY MOUTH AT BEDTIME Patient taking differently: Take 10 mg by mouth at bedtime. 05/06/21  Yes [name], NP  multivitamin (RENA-VIT) TABS tablet Take 1 tablet by mouth every evening. 05/26/20  Yes [provider]  ofloxacin (OCUFLOX) 0.3 % ophthalmic solution Place 1 drop into the right eye 4 (four) times daily. 02/22/21  Yes [name], NP  sucroferric oxyhydroxide (VELPHORO) 500 MG chewable tablet Chew 500 mg by mouth 3 (three) times daily as needed (with meals).   Yes [provider]  pantoprazole (PROTONIX) 40 MG tablet TAKE 1 TABLET(40 MG) BY MOUTH DAILY Patient not taking: No sig reported 05/17/21   [name], NP   Allergies  Allergen Reactions   Dextromethorphan-Guaifenesin Other (See Comments)    Unknown reaction   Tocotrienols  Other (See Comments)    Unknown reaction   Losartan Potassium Other (See Comments)    Causes constipation   Review of Systems  Unable to perform ROS: Age   Physical Exam Vitals and nursing note reviewed.  HENT:     Mouth/Throat:     Mouth: Mucous membranes are moist.  Cardiovascular:     Rate and Rhythm: Normal rate.  Pulmonary:     Effort: Pulmonary effort is normal. No respiratory distress.  Skin:    General: Skin is warm and dry.  Neurological:     Mental Status: He is alert and oriented to person, place, and time.  Psychiatric:        Mood and Affect: Mood normal.        Behavior: Behavior normal.    Vital Signs: BP 130/75 (BP Location: Left Arm)   Pulse 83   Temp 97.8 F (36.6 C) (Oral)   Resp (!) 21   Ht 6\' 2"  (1.88 m)   Wt 83.2 kg   SpO2 96%   BMI 23.55 kg/m  Pain Scale: 0-10   Pain Score: 0-No pain   SpO2: SpO2: 96 % O2 Device:SpO2: 96 % O2 Flow Rate: .O2 Flow Rate (L/min): 2 L/min  IO: Intake/output summary:  Intake/Output Summary (Last 24 hours) at 06/13/2021 1323 Last data filed at 06/13/2021 0900 Gross per 24 hour  Intake 1041.1 ml  Output --  Net 1041.1 ml    LBM:   Baseline Weight: Weight: 84.4 kg Most recent weight: Weight: 83.2 kg     Palliative Assessment/Data:   Flowsheet Rows    Flowsheet Row Most Recent Value  Intake Tab   Referral Department Hospitalist  Unit at Time of Referral Cardiac/Telemetry Unit  Palliative Care Primary Diagnosis Trauma  Date Notified 06/11/21  Palliative Care Type Return patient Palliative Care  Reason for referral Clarify Goals of Care  Date of Admission 06/10/21  Date first seen by Palliative Care 06/13/21  # of days Palliative referral response time 2 Day(s)  # of days IP prior to Palliative referral 1  Clinical Assessment   Palliative Performance Scale Score 30%  Pain Max last 24 hours Not able to report  Pain Min Last 24 hours Not able to report  Dyspnea Max Last 24 Hours Not able to report   Dyspnea Min Last 24 hours Not able to report  Psychosocial & Spiritual Assessment   Palliative Care Outcomes        Time In: 1100 Time Out: 1150 Time Total: 50 minutes  Greater than 50%  of this time was spent counseling and coordinating care related to the above assessment and plan.  Signed by: Drue Novel, NP   Please contact Palliative Medicine Team phone at [phone] for questions and concerns.  For individual provider: See Shea Evans

## 2021-06-13 NOTE — Progress Notes (Signed)
[facility] PROGRESS NOTE    David [name]  WVP:[id number] DOB: 05/27/1941 DOA: 06/10/2021 PCP: [name], NP      Brief Narrative:  David [name] is a 80 y.o. M with ESRD on HD TThS, HIV, pAF on ELiquis, sCHF, anemia, hx Cardiac arrest, stroke and OSA who presented after mechanical fall.  In the ER, found to have a right hip fracture.           Assessment & Plan:  [place] right hip fracture -Consult Orthopedics - Schedule acetaminophen - As needed oxycodone hydromorphone  -50% weightbearing on right leg - Previously on full anticoagulation, will resume today   ESRD Interdialytic hypotension -Consult Nephrology for maintenance HD - Continue midodrine  Paroxysmal atrial fibrillation Rate is normal today - Continue amiodarone - Resume Eliquis today  HIV Undetectable last Feb - Continue [name]  Chronic systolic and diastolic CHF Last EF 54-62%, grade II DD.   Still appears euvolemic, asymptomatic at baseline NYHA 2.  Cerebrovascular disease, secondary prevention  Sleep apnea History of sleep apnea.   Pressure injury, stage II right buttocks, POA Pressure injury, scrotus, stage II POA -Consult WOC  Glaucoma -Continue eye drops  Anemia of CKD Hemoglobin stable postop  Gout No active flare - Continue allopurinol             Disposition: Status is: Inpatient  Remains inpatient appropriate because:Unsafe d/c plan  Dispo:  Patient From: Home  Planned Disposition: Home  Medically stable for discharge: No      Patient was admitted for hip fracture, underwent pinning 7/10, we will plan for discharge to SNF.   Level of care: Telemetry Medical       MDM: The below labs and imaging reports were reviewed and summarized above.  Medication management as above.    DVT prophylaxis: apixaban (ELIQUIS) tablet 2.5 mg Start: 06/13/21 1000 SCDs Start: 06/12/21 1045 Place TED hose Start: 06/12/21  1045 SCDs Start: 06/10/21 2229 apixaban (ELIQUIS) tablet 2.5 mg  Code Status: FULL Family Communication: Nephew/POA by phone        Subjective: Nausea is improved.  No chest pain, dyspnea, confusion.  He is quite tired.  He has pain in the right leg.  Objective: Vitals:   06/13/21 0100 06/13/21 0449 06/13/21 0936 06/13/21 1328  BP: 105/67 121/73 130/75 128/82  Pulse: 76 77 83 82  Resp:  18 (!) 21 20  Temp: 97.9 F (36.6 C) 97.7 F (36.5 C) 97.8 F (36.6 C) 98.5 F (36.9 C)  TempSrc: Oral Oral Oral Oral  SpO2: 100% 100% 96% 98%  Weight:      Height:        Intake/Output Summary (Last 24 hours) at 06/13/2021 1802 Last data filed at 06/13/2021 1726 Gross per 24 hour  Intake 696.58 ml  Output --  Net 696.58 ml   Filed Weights   06/11/21 1230 06/11/21 1710 06/12/21 0719  Weight: 83.8 kg 82.3 kg 83.2 kg    Examination: General appearance: Elderly adult male, lying in bed, appears debilitated.  No acute distress.     HEENT: Anicteric, conjunctival pink, lids and lashes normal.  No nasal deformity, discharge, or epistaxis.  Lips moist, dentition normal, oropharynx moist, no oral lesions Skin:  Cardiac: RRR, no murmurs, I do not appreciate lower extremity edema. Respiratory: Normal respiratory rate and rhythm, lungs diminished but no rales or wheezes appreciated Abdomen: Abdomen soft without tenderness palpation or guarding. MSK:  Neuro: Awake and sleepy, extraocular movements intact, moves upper  extremities with generalized weakness, leg strength test tested due to weakness and pain Psych: sleepy but responsive and oriented, no hallucinations    Data Reviewed: I have personally reviewed following labs and imaging studies:  CBC: Recent Labs  Lab 06/10/21 1837 06/11/21 0444 06/13/21 0142  WBC 7.6 6.8 8.8  NEUTROABS 6.1  --   --   HGB 10.7* 10.6* 10.1*  HCT 32.7* 32.2* 30.5*  MCV 110.1* 107.3* 108.9*  PLT 160 151 409*   Basic Metabolic Panel: Recent Labs   Lab 06/10/21 1837 06/11/21 0444 06/13/21 0142  NA 137 136 138  K 3.7 3.8 4.7  CL 92* 93* 98  CO2 31 30 27   GLUCOSE 134* 120* 135*  BUN 29* 32* 51*  CREATININE 7.37* 7.96* 7.66*  CALCIUM 9.3 9.2 9.1  MG  --  2.2  --   PHOS  --  5.4*  --    GFR: Estimated Creatinine Clearance: 9.1 mL/min (A) (by C-G formula based on SCr of 7.66 mg/dL (H)). Liver Function Tests: Recent Labs  Lab 06/13/21 0142  AST 16  ALT 20  ALKPHOS 125  BILITOT 0.8  PROT 6.5  ALBUMIN 3.2*   No results for input(s): LIPASE, AMYLASE in the last 168 hours. No results for input(s): AMMONIA in the last 168 hours. Coagulation Profile: Recent Labs  Lab 06/10/21 1837  INR 1.3*   Cardiac Enzymes: No results for input(s): CKTOTAL, CKMB, CKMBINDEX, TROPONINI in the last 168 hours. BNP (last 3 results) No results for input(s): PROBNP in the last 8760 hours. HbA1C: No results for input(s): HGBA1C in the last 72 hours. CBG: No results for input(s): GLUCAP in the last 168 hours. Lipid Profile: No results for input(s): CHOL, HDL, LDLCALC, TRIG, CHOLHDL, LDLDIRECT in the last 72 hours. Thyroid Function Tests: No results for input(s): TSH, T4TOTAL, FREET4, T3FREE, THYROIDAB in the last 72 hours. Anemia Panel: No results for input(s): VITAMINB12, FOLATE, FERRITIN, TIBC, IRON, RETICCTPCT in the last 72 hours. Urine analysis:    Component Value Date/Time   COLORURINE YELLOW 11/30/2020 2354   APPEARANCEUR CLOUDY (A) 11/30/2020 2354   LABSPEC 1.011 11/30/2020 2354   PHURINE 9.0 (H) 11/30/2020 2354   GLUCOSEU NEGATIVE 11/30/2020 2354   HGBUR SMALL (A) 11/30/2020 2354   BILIRUBINUR NEGATIVE 11/30/2020 2354   BILIRUBINUR negative 04/16/2013 1447   KETONESUR NEGATIVE 11/30/2020 2354   PROTEINUR 100 (A) 11/30/2020 2354   UROBILINOGEN 0.2 05/11/2014 1352   NITRITE NEGATIVE 11/30/2020 2354   LEUKOCYTESUR LARGE (A) 11/30/2020 2354   Sepsis Labs: @LABRCNTIP (procalcitonin:4,lacticacidven:4)  ) Recent Results  (from the past 240 hour(s))  SARS CORONAVIRUS 2 (TAT 6-24 HRS) Nasopharyngeal Nasopharyngeal Swab     Status: None   Collection Time: 06/11/21  1:14 AM   Specimen: Nasopharyngeal Swab  Result Value Ref Range Status   SARS Coronavirus 2 NEGATIVE NEGATIVE Final    Comment: (NOTE) SARS-CoV-2 target nucleic acids are NOT DETECTED.  The SARS-CoV-2 RNA is generally detectable in upper and lower respiratory specimens during the acute phase of infection. Negative results do not preclude SARS-CoV-2 infection, do not rule out co-infections with other pathogens, and should not be used as the sole basis for treatment or other patient management decisions. Negative results must be combined with clinical observations, patient history, and epidemiological information. The expected result is Negative.  Fact Sheet for Patients: SugarRoll.be  Fact Sheet for Healthcare Providers: https://www.woods-mathews.com/  This test is not yet approved or cleared by the [place] and  has been authorized  for detection and/or diagnosis of SARS-CoV-2 by FDA under an Emergency Use Authorization (EUA). This EUA will remain  in effect (meaning this test can be used) for the duration of the COVID-19 declaration under Se ction 564(b)(1) of the Act, 21 U.S.C. section 360bbb-3(b)(1), unless the authorization is terminated or revoked sooner.  Performed at [facility], [place] [address] [place], [place] [address]   Surgical pcr screen     Status: None   Collection Time: 06/11/21 11:19 PM   Specimen: Nasal Mucosa; Nasal Swab  Result Value Ref Range Status   MRSA, PCR NEGATIVE NEGATIVE Final   Staphylococcus aureus NEGATIVE NEGATIVE Final    Comment: (NOTE) The Xpert SA Assay (FDA approved for NASAL specimens in patients 89 years of age and older), is one component of a comprehensive surveillance program. It is not intended to diagnose infection nor to guide or  monitor treatment. Performed at [facility], [place] [address], [place], [place] [address]          Radiology Studies: DG C-Arm 1-60 Min  Result Date: 06/12/2021 CLINICAL DATA:  RIGHT hip pinning. EXAM: OPERATIVE RIGHT HIP (WITH PELVIS IF PERFORMED) 3 VIEWS TECHNIQUE: Fluoroscopic spot image(s) were submitted for interpretation post-operatively. COMPARISON:  Plain film of the pelvis and RIGHT hip dated 06/10/2021. FINDINGS: Intraoperative fluoroscopic images showing 3 fixation screws traversing the RIGHT femoral neck fracture site. Screws appear intact and appropriately positioned. Fluoroscopy provided for 46 seconds. IMPRESSION: Intraoperative films demonstrating screw fixation of the RIGHT femoral neck fracture. Hardware appears intact and appropriately positioned. Electronically Signed   By: Franki Cabot M.D.   On: 06/12/2021 10:14   DG HIP OPERATIVE UNILAT WITH PELVIS RIGHT  Result Date: 06/12/2021 CLINICAL DATA:  RIGHT hip pinning. EXAM: OPERATIVE RIGHT HIP (WITH PELVIS IF PERFORMED) 3 VIEWS TECHNIQUE: Fluoroscopic spot image(s) were submitted for interpretation post-operatively. COMPARISON:  Plain film of the pelvis and RIGHT hip dated 06/10/2021. FINDINGS: Intraoperative fluoroscopic images showing 3 fixation screws traversing the RIGHT femoral neck fracture site. Screws appear intact and appropriately positioned. Fluoroscopy provided for 46 seconds. IMPRESSION: Intraoperative films demonstrating screw fixation of the RIGHT femoral neck fracture. Hardware appears intact and appropriately positioned. Electronically Signed   By: Franki Cabot M.D.   On: 06/12/2021 10:14        Scheduled Meds:  acetaminophen  1,000 mg Oral TID   [START ON 06/14/2021] allopurinol  100 mg Oral Once per day on Tue Thu Sat   amiodarone  200 mg Oral Daily   apixaban  2.5 mg Oral BID   bictegravir-emtricitabine-tenofovir AF  1 tablet Oral Daily   brimonidine  1 drop Right Eye TID   Chlorhexidine  Gluconate Cloth  6 each Topical Daily   [START ON 06/14/2021] Chlorhexidine Gluconate Cloth  6 each Topical Q0600   docusate sodium  100 mg Oral BID   dorzolamide-timolol  1 drop Right Eye BID   [START ON 06/14/2021] midodrine  10 mg Oral 2 times per day on Tue Thu Sat   montelukast  10 mg Oral QHS   multivitamin  1 tablet Oral QPM   ofloxacin  1 drop Right Eye QID   pantoprazole  40 mg Oral Daily   senna-docusate  2 tablet Oral QHS   Continuous Infusions:  sodium chloride 10 mL/hr at 06/12/21 1323   sodium chloride 10 mL/hr at 06/12/21 1318   promethazine (PHENERGAN) injection (IM or IVPB)       LOS: 3 days  Time spent: 25 minutes    Edwin Dada, MD [facility] 06/13/2021, 6:02 PM     Please page though [place] or Epic secure chat:  For [employer], Adult nurse

## 2021-06-13 NOTE — Progress Notes (Signed)
     Subjective: 1 Day Post-Op Procedure(s) (LRB): CANNULATED RIGHT HIP PINNING (Right) Awake, alert and oriented x 4. OK to restart anticoagulation. D/C planning. SNF placement, need transport to and from dialysis and has eye surgery planned for the 26th.  Patient reports pain as mild.    Objective:   VITALS:  Temp:  [97.5 F (36.4 C)-97.9 F (36.6 C)] 97.8 F (36.6 C) (07/11 0936) Pulse Rate:  [75-86] 83 (07/11 0936) Resp:  [16-21] 21 (07/11 0936) BP: (97-143)/(54-79) 130/75 (07/11 0936) SpO2:  [96 %-100 %] 96 % (07/11 0936)  Neurologically intact ABD soft Neurovascular intact Sensation intact distally Intact pulses distally Dorsiflexion/Plantar flexion intact Incision: moderate drainage and leave this bandage and will change with new mepilex bandaid tomorrow.    LABS Recent Labs    06/10/21 1837 06/11/21 0444 06/13/21 0142  HGB 10.7* 10.6* 10.1*  WBC 7.6 6.8 8.8  PLT 160 151 132*   Recent Labs    06/11/21 0444 06/13/21 0142  NA 136 138  K 3.8 4.7  CL 93* 98  CO2 30 27  BUN 32* 51*  CREATININE 7.96* 7.66*  GLUCOSE 120* 135*   Recent Labs    06/10/21 1837  INR 1.3*     Assessment/Plan: 1 Day Post-Op Procedure(s) (LRB): CANNULATED RIGHT HIP PINNING (Right)  Advance diet Up with therapy D/C IV fluids Discharge to SNF 50% WB right leg.   [name] 06/13/2021, 1:00 PM Patient ID: David [name], male   DOB: [birth year], 80 y.o.   MRN: [id number]

## 2021-06-13 NOTE — Evaluation (Signed)
Physical Therapy Evaluation Patient Details Name: David [name] MRN: [id number] DOB: [birth year] Today's Date: 06/13/2021   History of Present Illness  80 yo male sustained a fall on 7/8 and dx with R nondisplaced subcapital hip fracture. Pt received ORIF on 7/10, permitted 94% WB on R hip on cannulated hip pinning.  PMHx:  OA L hip, ESRD, glaucoma, HIV, HTN, PNA, prostate CA, CKD5, Covid 19, a-fib, cardiac arrest, stroke, bradycardia, cardiomyopathy  Clinical Impression  Pt was seen for initial eval with notable difficulty in getting to stand due to his recent cardiac event with CPR.  Additionally has B hip pain with R hip ORIF and L hip end stage OA.  Follow along with pt to work on developing his strength and control of standing, with increased control of sliding transfers and use of pain management, as well as two person help to accomplish mobility.    Follow Up Recommendations SNF    Equipment Recommendations  None recommended by PT    Recommendations for Other Services       Precautions / Restrictions Precautions Precautions: Fall Restrictions Weight Bearing Restrictions: Yes RLE Weight Bearing: Partial weight bearing RLE Partial Weight Bearing Percentage or Pounds: 50% Other Position/Activity Restrictions: WBAT LLE      Mobility  Bed Mobility Overal bed mobility: Needs Assistance Bed Mobility: Supine to Sit;Sit to Supine     Supine to sit: Max assist Sit to supine: Max assist   General bed mobility comments: max assist to move legs and support trunk    Transfers Overall transfer level: Needs assistance Equipment used: 1 person hand held assist Transfers: Lateral/Scoot Transfers;Sit to/from Stand Sit to Stand: Total assist;From elevated surface (unable to stand)        Lateral/Scoot Transfers: Mod assist;From elevated surface General transfer comment: bed pad to assist lateral scoot  Ambulation/Gait             General Gait Details: not able to  stand  Stairs            Wheelchair Mobility    Modified Rankin (Stroke Patients Only)       Balance Overall balance assessment: Needs assistance;History of Falls   Sitting balance-Leahy Scale: Poor                                       Pertinent Vitals/Pain Pain Assessment: Faces Faces Pain Scale: Hurts little more Pain Location: R hip, ribcage and sternum Pain Descriptors / Indicators: Operative site guarding;Grimacing;Guarding Pain Intervention(s): Limited activity within patient's tolerance;Monitored during session;Premedicated before session;Repositioned    Home Living Family/patient expects to be discharged to:: Private residence Living Arrangements: Spouse/significant other Available Help at Discharge: Family;Available PRN/intermittently Type of Home: House Home Access: Stairs to enter;Ramped entrance Entrance Stairs-Rails: Right Entrance Stairs-Number of Steps: 3 Home Layout: One level Home Equipment: Walker - 2 wheels;Cane - single point;Grab bars - toilet;Toilet riser Additional Comments: information from chart and pt is conflicting with it    Prior Function Level of Independence: Independent with assistive device(s)         Comments: uses walker and does well with it. Has had a fall within the past 3 months- moved walker out of the way do he could open the door to let the dog in and slipped. Pt reports typically able to dress self but then later reports wife helps for LB dressing around feet. Reports able to toilet  self and shower without assistance     Hand Dominance   Dominant Hand: Right    Extremity/Trunk Assessment   Upper Extremity Assessment Upper Extremity Assessment: Generalized weakness    Lower Extremity Assessment Lower Extremity Assessment: Generalized weakness;RLE deficits/detail RLE Deficits / Details: weak and painful, requires help to move to side of bed RLE: Unable to fully assess due to pain RLE  Coordination: decreased gross motor    Cervical / Trunk Assessment Cervical / Trunk Assessment: Other exceptions (recent CPR with injured ribs and sternum)  Communication   Communication: No difficulties  Cognition Arousal/Alertness: Awake/alert Behavior During Therapy: Flat affect Overall Cognitive Status: No family/caregiver present to determine baseline cognitive functioning                                 General Comments: nephew was on phone and then gone so not clear about PLOF cognitively      General Comments General comments (skin integrity, edema, etc.): pt was assisted to sit but cannot stand up on side of bed between UE weakness from recent CPR and his pain of B hips    Exercises     Assessment/Plan    PT Assessment Patient needs continued PT services  PT Problem List Decreased strength;Decreased range of motion;Decreased activity tolerance;Decreased balance;Decreased mobility;Decreased coordination;Decreased knowledge of use of DME;Decreased cognition;Decreased safety awareness;Decreased knowledge of precautions;Decreased skin integrity;Pain       PT Treatment Interventions DME instruction;Gait training;Stair training;Functional mobility training;Therapeutic activities;Therapeutic exercise;Balance training;Neuromuscular re-education;Patient/family education    PT Goals (Current goals can be found in the Care Plan section)  Acute Rehab PT Goals Patient Stated Goal: to walk again PT Goal Formulation: With patient/family Time For Goal Achievement: 06/27/21 Potential to Achieve Goals: Fair    Frequency Min 5X/week   Barriers to discharge Inaccessible home environment;Decreased caregiver support home with wife but has steps or ramp to enter    Co-evaluation               AM-PAC PT "6 Clicks" Mobility  Outcome Measure Help needed turning from your back to your side while in a flat bed without using bedrails?: A Little Help needed moving from  lying on your back to sitting on the side of a flat bed without using bedrails?: A Lot Help needed moving to and from a bed to a chair (including a wheelchair)?: A Lot Help needed standing up from a chair using your arms (e.g., wheelchair or bedside chair)?: Total Help needed to walk in hospital room?: Total Help needed climbing 3-5 steps with a railing? : Total 6 Click Score: 10    End of Session Equipment Utilized During Treatment: Gait belt Activity Tolerance: Patient limited by fatigue;Patient limited by pain Patient left: in bed;with call bell/phone within reach;with bed alarm set Nurse Communication: Mobility status PT Visit Diagnosis: Muscle weakness (generalized) (M62.81);Pain;History of falling (Z91.81);Difficulty in walking, not elsewhere classified (R26.2) Pain - Right/Left: Right (Left) Pain - part of body: Hip    Time: 6237-6283 PT Time Calculation (min) (ACUTE ONLY): 47 min   Charges:   PT Evaluation $PT Eval Moderate Complexity: 1 Mod PT Treatments $Therapeutic Activity: 23-37 mins       [name] 06/13/2021, 5:50 PM  Mee Hives, PT MS Acute Rehab Dept. Number: [place] and [place]

## 2021-06-14 DIAGNOSIS — S72011A Unspecified intracapsular fracture of right femur, initial encounter for closed fracture: Secondary | ICD-10-CM | POA: Diagnosis present

## 2021-06-14 LAB — BASIC METABOLIC PANEL
Anion gap: 14 (ref 5–15)
BUN: 69 mg/dL — ABNORMAL HIGH (ref 8–23)
CO2: 25 mmol/L (ref 22–32)
Calcium: 9 mg/dL (ref 8.9–10.3)
Chloride: 95 mmol/L — ABNORMAL LOW (ref 98–111)
Creatinine, Ser: 9.22 mg/dL — ABNORMAL HIGH (ref 0.61–1.24)
GFR, Estimated: 5 mL/min — ABNORMAL LOW (ref >60)
Glucose, Bld: 122 mg/dL — ABNORMAL HIGH (ref 70–99)
Potassium: 4.9 mmol/L (ref 3.5–5.1)
Sodium: 134 mmol/L — ABNORMAL LOW (ref 135–145)

## 2021-06-14 LAB — CBC
HCT: 32.5 % — ABNORMAL LOW (ref 39.0–52.0)
Hemoglobin: 10.6 g/dL — ABNORMAL LOW (ref 13.0–17.0)
MCH: 35.1 pg — ABNORMAL HIGH (ref 26.0–34.0)
MCHC: 32.6 g/dL (ref 30.0–36.0)
MCV: 107.6 fL — ABNORMAL HIGH (ref 80.0–100.0)
Platelets: 168 10*3/uL (ref 150–400)
RBC: 3.02 MIL/uL — ABNORMAL LOW (ref 4.22–5.81)
RDW: 15.9 % — ABNORMAL HIGH (ref 11.5–15.5)
WBC: 9 10*3/uL (ref 4.0–10.5)
nRBC: 0 % (ref 0.0–0.2)

## 2021-06-14 MED ORDER — ALTEPLASE 2 MG IJ SOLR: 2.0000 mg | Freq: Once | INTRAMUSCULAR | Status: DC | PRN

## 2021-06-14 MED ORDER — LIDOCAINE-PRILOCAINE 2.5-2.5 % EX CREA
1.0000 "application " | TOPICAL_CREAM | CUTANEOUS | Status: DC | PRN
  Filled 2021-06-14: qty 5

## 2021-06-14 MED ORDER — SODIUM CHLORIDE 0.9 % IV SOLN: 100.0000 mL | INTRAVENOUS | Status: DC | PRN

## 2021-06-14 MED ORDER — HEPARIN SODIUM (PORCINE) 1000 UNIT/ML DIALYSIS: 1000.0000 [IU] | INTRAMUSCULAR | Status: DC | PRN

## 2021-06-14 MED ORDER — LIDOCAINE HCL (PF) 1 % IJ SOLN: 5.0000 mL | INTRAMUSCULAR | Status: DC | PRN

## 2021-06-14 MED ORDER — PENTAFLUOROPROP-TETRAFLUOROETH EX AERO: 1.0000 "application " | INHALATION_SPRAY | CUTANEOUS | Status: DC | PRN

## 2021-06-14 MED ORDER — MIDODRINE HCL 5 MG PO TABS
ORAL_TABLET | ORAL | Status: AC
  Filled 2021-06-14: qty 2

## 2021-06-14 NOTE — Progress Notes (Signed)
[facility] Progress Note   Subjective:   Patient seen and examined at bedside.  Tolerating dialysis well so far.  No complaints. Denies CP, hip pain, SOB, and n/v/d.   Objective Vitals:   06/14/21 0900 06/14/21 0926 06/14/21 1000 06/14/21 1016  BP: 130/65 (!) 115/56 124/66 118/62  Pulse: 78 72    Resp:    19  Temp:    (!) 97.4 F (36.3 C)  TempSrc:    Oral  SpO2:    99%  Weight:    84.7 kg  Height:       Physical Exam General:well appearing elderly male in NAD Heart:RRR, no mrg Lungs:CTAB, nml WOB on RA Abdomen:soft, NTND Extremities:no LE edema Dialysis Access: RU AVG in use   Filed Weights   06/12/21 0719 06/14/21 0700 06/14/21 1016  Weight: 83.2 kg 86.7 kg 84.7 kg    Intake/Output Summary (Last 24 hours) at 06/14/2021 1158 Last data filed at 06/14/2021 1016 Gross per 24 hour  Intake 563.6 ml  Output 2000 ml  Net -1436.4 ml    Additional Objective Labs: Basic Metabolic Panel: Recent Labs  Lab 06/11/21 0444 06/13/21 0142 06/14/21 0035  NA 136 138 134*  K 3.8 4.7 4.9  CL 93* 98 95*  CO2 30 27 25   GLUCOSE 120* 135* 122*  BUN 32* 51* 69*  CREATININE 7.96* 7.66* 9.22*  CALCIUM 9.2 9.1 9.0  PHOS 5.4*  --   --    Liver Function Tests: Recent Labs  Lab 06/13/21 0142  AST 16  ALT 20  ALKPHOS 125  BILITOT 0.8  PROT 6.5  ALBUMIN 3.2*   CBC: Recent Labs  Lab 06/10/21 1837 06/11/21 0444 06/13/21 0142 06/14/21 0035  WBC 7.6 6.8 8.8 9.0  NEUTROABS 6.1  --   --   --   HGB 10.7* 10.6* 10.1* 10.6*  HCT 32.7* 32.2* 30.5* 32.5*  MCV 110.1* 107.3* 108.9* 107.6*  PLT 160 151 132* 168    Medications:  sodium chloride 10 mL/hr at 06/12/21 1323   sodium chloride 10 mL/hr at 06/12/21 1318   promethazine (PHENERGAN) injection (IM or IVPB)      acetaminophen  1,000 mg Oral TID   allopurinol  100 mg Oral Once per day on Tue Thu Sat   amiodarone  200 mg Oral Daily   apixaban  2.5 mg Oral BID   bictegravir-emtricitabine-tenofovir AF  1  tablet Oral Daily   brimonidine  1 drop Right Eye TID   Chlorhexidine Gluconate Cloth  6 each Topical Daily   Chlorhexidine Gluconate Cloth  6 each Topical Q0600   docusate sodium  100 mg Oral BID   dorzolamide-timolol  1 drop Right Eye BID   midodrine       midodrine  10 mg Oral 2 times per day on Tue Thu Sat   montelukast  10 mg Oral QHS   multivitamin  1 tablet Oral QPM   ofloxacin  1 drop Right Eye QID   pantoprazole  40 mg Oral Daily   senna-docusate  2 tablet Oral QHS    Dialysis Orders: Mon/Tues/Thurs/Sat - [facility]  4hrs, BFR 400, DFR 1.5 AutoFlow,  EDW 84kg, 2K/ 2Ca   Heparin 2000 unit bolus Q HD Mircera 75 mcg q2wks - last 06/02/21 Venofer 50mg  IV weekly-last 06/07/21 Hectorol 50mcg IV qHD-last 06/09/21   Home meds: Velphoro 500mg  chewable 1 tab TID with meals and BID PRN with snacks Midodrine 10mg  TID on HD days (Mon/Tues/Thurs/Sat)  Assessment/Plan: Right Subcapital Hip Fracture- s/p right hip pinning on 06/12/21. ESRD - on HD 4x weekly Mon/Tues/Thurs/Sat d/t frequent hospitalizations for volume overload and recently meeting EDW.  Volume status currently stable. Plan for HD on TTS schedule while admitted.  HD today.  Hold heparin x1 week for recent surgery. K 4.9. Hypertension/volume  - Blood pressures in goal today.  On midodrine for hypotension with HD. Does not appear volume overloaded. Close to EDW if weights correct.  UF as tolerated.    Anemia of CKD - Hgb 10.6 -ESA due 06/16/21 -continue to follow.   Secondary Hyperparathyroidism - Last Ca and phos in goal.  Continue VDRA and binders.  Nutrition - Renal diet with fluid restriction. Protein supplements. Chronic systolic/diastolic HF HIV A fib   Jen Mow, PA-C [place] 06/14/2021,11:58 AM  LOS: 4 days

## 2021-06-14 NOTE — Consult Note (Signed)
   [facility] Inpatient Consult   06/14/2021  David [name] August 28, 1941 [id number]  [name]: [name] Medicare  Patient screened for hospitalization with noted extreme high risk score for unplanned readmission risk. To assess for potential [name] service needs for post hospital transition.  Review of patient's medical record reveals patient has a primary care provider in an Embedded practice with a Chronic Care Management program.  Patient is being recommended for a skilled nursing facility for rehab.  Plan:  Continue to follow progress and disposition to assess for post hospital care management needs.  Will make appropriate referrals for post hospital chronic care needs.  For questions contact:   Natividad Brood, RN BSN [name]  [phone] business mobile phone Toll free office [phone]  Fax number: [phone] [name]

## 2021-06-14 NOTE — Care Management Important Message (Signed)
Important Message  Patient Details  Name: David [name] MRN: [id number] Date of Birth: 24-Oct-1941   Medicare Important Message Given:  Yes     [name] [name] 06/14/2021, 3:35 PM

## 2021-06-14 NOTE — Progress Notes (Signed)
Hemodialysis- Completed without issue. 2L removed as ordered. Report called to 6N. Patient currently resting without complaints

## 2021-06-14 NOTE — Progress Notes (Signed)
     Subjective: 2 Days Post-Op Procedure(s) (LRB): CANNULATED RIGHT HIP PINNING (Right) Awake, alert and oriented x 4, back from hemodialysis. Hip feels better. 50%weight bearing right leg.  Patient reports pain as mild.    Objective:   VITALS:  Temp:  [97.4 F (36.3 C)-98.1 F (36.7 C)] 97.4 F (36.3 C) (07/12 1016) Pulse Rate:  [70-88] 72 (07/12 0926) Resp:  [16-20] 19 (07/12 1016) BP: (115-142)/(56-80) 118/62 (07/12 1016) SpO2:  [95 %-100 %] 99 % (07/12 1016) Weight:  [84.7 kg-86.7 kg] 84.7 kg (07/12 1016)  Neurologically intact ABD soft Neurovascular intact Sensation intact distally Intact pulses distally Dorsiflexion/Plantar flexion intact Incision: scant drainage Dressing was changed yesterday, today examined no sign of infection, mild swelling. Steristrips appled and tinct of benzoin. New mepilex bandaid applied.   LABS Recent Labs    06/13/21 0142 06/14/21 0035  HGB 10.1* 10.6*  WBC 8.8 9.0  PLT 132* 168   Recent Labs    06/13/21 0142 06/14/21 0035  NA 138 134*  K 4.7 4.9  CL 98 95*  CO2 27 25  BUN 51* 69*  CREATININE 7.66* 9.22*  GLUCOSE 135* 122*   No results for input(s): LABPT, INR in the last 72 hours.   Assessment/Plan: 2 Days Post-Op Procedure(s) (LRB): CANNULATED RIGHT HIP PINNING (Right)  Advance diet Up with therapy D/C IV fluids Discharge to SNF, he is stable and okay to go to SNF for continued rehabilitation. Resume eloquis.   [name] 06/14/2021, 2:09 PM Patient ID: David [name], male   DOB: 1940/12/29, 80 y.o.   MRN: [id number]

## 2021-06-14 NOTE — Progress Notes (Signed)
PT Cancellation Note  Patient Details Name: David [name] MRN: [id number] DOB: 13-Jun-1941   Cancelled Treatment:    Reason Eval/Treat Not Completed: (P) Patient at procedure or test/unavailable (Pt off unit for HD.  Willl f/u per POC.)   David [name] 06/14/2021, 10:21 AM  Erasmo Leventhal , PTA Acute Rehabilitation Services Pager [phone] Office [phone]

## 2021-06-14 NOTE — NC FL2 (Signed)
[place] LEVEL OF CARE SCREENING TOOL     IDENTIFICATION  Patient Name: David [name] Birthdate: 1940-12-18 Sex: male Admission Date (Current Location): 06/10/2021  [facility] and [place] Number:  Herbalist and Address:  [facility] [place], [place] [address], [place], [place] [address]      Provider Number: [id number]  Attending Physician Name and Address:  Edwin Dada, *  Relative Name and Phone Number:       Current Level of Care: Hospital Recommended Level of Care: [place] Prior Approval Number:    Date Approved/Denied:   PASRR Number: [id number]  Discharge Plan: SNF    Current Diagnoses: Patient Active Problem List   Diagnosis Date Noted   Subcapital fracture of hip, right, closed, initial encounter ([place]) 06/14/2021   S/P right hip fracture 06/10/2021   Gait disorder 07/17/4817   Complication of vascular dialysis catheter 09/29/2020   Disease of gallbladder, unspecified 05/18/2020   Elevated LFTs    Abdominal pain    Transaminitis 05/15/2020   Full code status 02/03/2020   Subtherapeutic international normalized ratio (INR) 01/24/2020   Pressure injury of skin 01/07/2020   Combined systolic and diastolic heart failure ([place])    COVID-19 12/11/2019   Acute respiratory failure with hypoxemia ([place]) 11/01/2019   Closed fracture of nasal bones 09/10/2019   Hyperkalemia 09/02/2019   Shortness of breath 04/12/2019   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   Dependence on renal dialysis ([place]) 03/27/2019   Hypertensive heart and chronic kidney disease with heart failure and with stage 5 chronic kidney disease, or end stage renal disease ([place]) 03/27/2019   Mesenteric ischemia (HCC)    Colonic ulcer    Right lower quadrant pain    Abnormal CT of the abdomen    Ischemic colitis ([place])    Colitis presumed infectious 03/09/2019   Acute respiratory failure with hypoxia and hypercapnia ([place]) 03/04/2019    Pruritus, unspecified 02/21/2019   Acute pulmonary edema (HCC) 02/11/2019   Pain, unspecified 01/21/2019   Paroxysmal atrial fibrillation ([place]) 11/16/2018   LBBB (left bundle branch block) 11/16/2018   Pulmonary edema 10/21/2018   Gout 10/21/2018   Hypertensive emergency 10/21/2018   Chronic combined systolic and diastolic heart failure, NYHA class 3 ([place]) 10/21/2018   Leukocytosis 10/21/2018   Gastroesophageal reflux disease 08/28/2018   CAD (coronary artery disease) 04/03/2018   Palliative care encounter 03/27/2018   Atrial fibrillation, chronic ([place]) 03/25/2018   Anemia in chronic kidney disease 03/12/2018   Coagulation defect, unspecified ([place]) 03/12/2018   Secondary hyperparathyroidism of renal origin ([place]) 03/12/2018   Palliative care by specialist    Advance care planning    Goals of care, counseling/discussion    Respiratory arrest ([place])    Acute on chronic systolic heart failure (HCC)    Chronic obstructive pulmonary disease ([place])    HIV (human immunodeficiency virus infection) ([place])    Pneumonia    Aspiration pneumonia of both lower lobes due to regurgitated food ([place])    Sepsis due to pneumonia ([place]) 11/22/2017   AIDS ([place]) 05/14/2014   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   Gastroparesis 05/06/2014   Protein-calorie malnutrition, severe ([place]) 05/05/2014   ESRD on hemodialysis ([place]) 05/02/2014   Hemodialysis-associated hypotension 05/02/2014   Leaking of conjunctival drainage bleb 05/01/2014   POAG (primary open-angle glaucoma) 05/01/2014   Loss of weight 04/29/2014   Conjunctivitis 04/29/2014   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   Low back pain 04/09/2012   Osteoarthritis of  hip 12/29/2011   Iron deficiency anemia 06/28/2011   Macrocytic anemia 04/02/2011   ADENOCARCINOMA, PROSTATE, 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   Hyperlipidemia 11/30/2010   Transient cerebral ischemia 11/30/2010   HYPERGLYCEMIA 11/30/2010    Orientation RESPIRATION BLADDER Height &  Weight     Self, Time, Situation, Place  Normal Continent Weight: 186 lb 11.7 oz (84.7 kg) Height:  6\' 2"  (188 cm)  BEHAVIORAL SYMPTOMS/MOOD NEUROLOGICAL BOWEL NUTRITION STATUS        Diet (See DC plan)  AMBULATORY STATUS COMMUNICATION OF NEEDS Skin   Extensive Assist Verbally Normal, Skin abrasions                       Personal Care Assistance Level of Assistance  Bathing, Feeding, Dressing Bathing Assistance: Maximum assistance Feeding assistance: Limited assistance Dressing Assistance: Maximum assistance     Functional Limitations Info             SPECIAL CARE FACTORS FREQUENCY  PT (By licensed PT), OT (By licensed OT)     PT Frequency: 5x a week OT Frequency: 5x a week            Contractures Contractures Info: Not present    Additional Factors Info  Code Status, Allergies Code Status Info: Full Allergies Info: Dextromethorphan-guaifenesin   Tocotrienols   Losartan Potassium           Current Medications (06/14/2021):  This is the current hospital active medication list Current Facility-Administered Medications  Medication Dose Route Frequency Provider Last Rate Last Admin   0.9 %  sodium chloride infusion   Intravenous Continuous Jessy Oto, MD 10 mL/hr at 06/12/21 1323 New Bag at 06/12/21 1323   0.9 %  sodium chloride infusion   Intravenous Continuous Jessy Oto, MD 10 mL/hr at 06/12/21 1318 New Bag at 06/12/21 1318   acetaminophen (TYLENOL) tablet 1,000 mg  1,000 mg Oral TID Edwin Dada, MD   1,000 mg at 06/14/21 1052   allopurinol (ZYLOPRIM) tablet 100 mg  100 mg Oral Once per day on Tue Thu Sat Adelfa Koh, NP       amiodarone (PACERONE) tablet 200 mg  200 mg Oral Daily Jessy Oto, MD   200 mg at 06/14/21 1049   apixaban (ELIQUIS) tablet 2.5 mg  2.5 mg Oral BID Jessy Oto, MD   2.5 mg at 06/14/21 1044   bictegravir-emtricitabine-tenofovir AF ([name]) 50-200-25 MG per tablet 1 tablet  1 tablet Oral Daily Jessy Oto, MD   1 tablet at 06/14/21 1044   brimonidine (ALPHAGAN) 0.15 % ophthalmic solution 1 drop  1 drop Right Eye TID Jessy Oto, MD   1 drop at 06/14/21 1044   Chlorhexidine Gluconate Cloth 2 % PADS 6 each  6 each Topical Daily Jessy Oto, MD   6 each at 06/14/21 1044   Chlorhexidine Gluconate Cloth 2 % PADS 6 each  6 each Topical Q0600 Penninger, Ria Comment, [place]   6 each at 06/14/21 0511   dorzolamide-timolol (COSOPT) 22.3-6.8 MG/ML ophthalmic solution 1 drop  1 drop Right Eye BID Jessy Oto, MD   1 drop at 06/14/21 1045   HYDROmorphone (DILAUDID) injection 1 mg  1 mg Intravenous Q4H PRN Danford, Suann Larry, MD       labetalol (NORMODYNE) injection 5 mg  5 mg Intravenous Q6H PRN Jessy Oto, MD       menthol-cetylpyridinium (CEPACOL) lozenge 3 mg  1 lozenge Oral  PRN Jessy Oto, MD       Or   phenol (CHLORASEPTIC) mouth spray 1 spray  1 spray Mouth/Throat PRN Jessy Oto, MD       metoCLOPramide (REGLAN) tablet 5-10 mg  5-10 mg Oral Q8H PRN Jessy Oto, MD       Or   metoCLOPramide (REGLAN) injection 5-10 mg  5-10 mg Intravenous Q8H PRN Jessy Oto, MD       midodrine (PROAMATINE) 5 MG tablet            midodrine (PROAMATINE) tablet 10 mg  10 mg Oral 2 times per day on Tue Thu Sat Adelfa Koh, NP   10 mg at 06/14/21 0839   montelukast (SINGULAIR) tablet 10 mg  10 mg Oral QHS Jessy Oto, MD   10 mg at 06/13/21 2123   multivitamin (RENA-VIT) tablet 1 tablet  1 tablet Oral QPM Jessy Oto, MD   1 tablet at 06/13/21 1738   naloxone (NARCAN) injection 0.4 mg  0.4 mg Intravenous PRN Jessy Oto, MD       ofloxacin (OCUFLOX) 0.3 % ophthalmic solution 1 drop  1 drop Right Eye QID Jessy Oto, MD   1 drop at 06/14/21 1046   ondansetron (ZOFRAN) injection 4 mg  4 mg Intravenous Q6H PRN Jessy Oto, MD   4 mg at 06/12/21 0545   oxyCODONE (Oxy IR/ROXICODONE) immediate release tablet 5 mg  5 mg Oral Q6H PRN Jessy Oto, MD   5 mg at 06/14/21 1342    pantoprazole (PROTONIX) EC tablet 40 mg  40 mg Oral Daily Jessy Oto, MD   40 mg at 06/14/21 1049   polyethylene glycol (MIRALAX / GLYCOLAX) packet 17 g  17 g Oral Daily PRN Jessy Oto, MD       promethazine (PHENERGAN) 6.25 mg in sodium chloride 0.9 % 50 mL IVPB  6.25 mg Intravenous Q6H PRN Jessy Oto, MD       sodium phosphate (FLEET) 7-19 GM/118ML enema 1 enema  1 enema Rectal Once PRN Jessy Oto, MD       sorbitol 70 % solution 30 mL  30 mL Oral Daily PRN Jessy Oto, MD       sucroferric oxyhydroxide [facility]) chewable tablet 500 mg  500 mg Oral TID PRN Jessy Oto, MD         Discharge Medications: Please see discharge summary for a list of discharge medications.  Relevant Imaging Results:  Relevant Lab Results:   Additional Information SSN: [id number]  Emeterio Reeve, [place]

## 2021-06-14 NOTE — Progress Notes (Signed)
Physical Therapy Treatment Patient Details Name: David [name] MRN: [id number] DOB: 22-Mar-1941 Today's Date: 06/14/2021    History of Present Illness 80 yo male sustained a fall on 7/8 and dx with R nondisplaced subcapital hip fracture. Pt received ORIF on 7/10, permitted 91% WB on R hip on cannulated hip pinning.  PMHx:  OA L hip, ESRD, glaucoma, HIV, HTN, PNA, prostate CA, CKD5, Covid 19, a-fib, cardiac arrest, stroke, bradycardia, cardiomyopathy    PT Comments    Pt supine in bed.  Performed transfer OOB into standing.  Noted with BM incontinence.  Pt required transfer to commode to complete BM.  Continue to recommend snf placement to improve strength and function before returning home.  Pt continues to require +2 assistance.      Follow Up Recommendations  SNF     Equipment Recommendations       Recommendations for Other Services       Precautions / Restrictions Precautions Precautions: Fall Restrictions Weight Bearing Restrictions: Yes RLE Weight Bearing: Partial weight bearing Other Position/Activity Restrictions: WBAT LLE    Mobility  Bed Mobility Overal bed mobility: Needs Assistance Bed Mobility: Supine to Sit     Supine to sit: Max assist;+2 for physical assistance     General bed mobility comments: Pt required assistance to move LEs to edge of bed and elevate trunk into sitting.  pt presents with strong posterior lean.    Transfers Overall transfer level: Needs assistance Equipment used: Ambulation equipment used (sara stedy) Transfers: Sit to/from Stand Sit to Stand: Mod assist;+2 physical assistance;From elevated surface         General transfer comment: Cues for hand placement to and from seated surface.  Pt required step by step cues to move into standing.  Pt stood x 2 in stedy and moved from bed and from commode.  Ambulation/Gait Ambulation/Gait assistance:  (NT)               Stairs             Wheelchair Mobility     Modified Rankin (Stroke Patients Only)       Balance Overall balance assessment: Needs assistance;History of Falls   Sitting balance-Leahy Scale: Poor       Standing balance-Leahy Scale: Fair                              Cognition Arousal/Alertness: Awake/alert Behavior During Therapy: Flat affect Overall Cognitive Status: No family/caregiver present to determine baseline cognitive functioning                                        Exercises General Exercises - Lower Extremity Ankle Circles/Pumps: AROM;Both;10 reps;Supine Quad Sets: Both;10 reps;AROM;Supine Heel Slides: Both;10 reps;Supine;AAROM Hip ABduction/ADduction: Both;10 reps;Supine;AAROM    General Comments        Pertinent Vitals/Pain Pain Assessment: Faces Faces Pain Scale: Hurts little more Pain Location: R hip, ribcage and sternum Pain Descriptors / Indicators: Operative site guarding;Grimacing;Guarding Pain Intervention(s): Monitored during session;Repositioned    Home Living                      Prior Function            PT Goals (current goals can now be found in the care plan section) Acute Rehab PT Goals Patient Stated Goal:  to walk again Potential to Achieve Goals: Fair Progress towards PT goals: Progressing toward goals    Frequency    Min 3X/week (based on policy)      PT Plan Frequency needs to be updated    Co-evaluation              AM-PAC PT "6 Clicks" Mobility   Outcome Measure  Help needed turning from your back to your side while in a flat bed without using bedrails?: A Little Help needed moving from lying on your back to sitting on the side of a flat bed without using bedrails?: A Lot Help needed moving to and from a bed to a chair (including a wheelchair)?: A Lot Help needed standing up from a chair using your arms (e.g., wheelchair or bedside chair)?: A Lot Help needed to walk in hospital room?: Total Help needed  climbing 3-5 steps with a railing? : Total 6 Click Score: 11    End of Session Equipment Utilized During Treatment: Gait belt Activity Tolerance: Patient limited by fatigue;Patient limited by pain Patient left: in bed;with call bell/phone within reach;with bed alarm set Nurse Communication: Mobility status PT Visit Diagnosis: Muscle weakness (generalized) (M62.81);Pain;History of falling (Z91.81);Difficulty in walking, not elsewhere classified (R26.2) Pain - part of body: Hip     Time: 0539-7673 PT Time Calculation (min) (ACUTE ONLY): 26 min  Charges:  $Therapeutic Exercise: 8-22 mins $Therapeutic Activity: 8-22 mins                     Erasmo Leventhal , PTA Acute Rehabilitation Services Pager [phone] Office [phone]    Kier Smead Eli Hose 06/14/2021, 5:37 PM

## 2021-06-14 NOTE — Progress Notes (Signed)
[facility] PROGRESS NOTE    David [name]  GMW:[id number] DOB: 10/28/1941 DOA: 06/10/2021 PCP: [name], NP      Brief Narrative:  David [name] is a 80 y.o. M with ESRD on HD TThS, HIV, pAF on ELiquis, sCHF, anemia, hx Cardiac arrest, stroke and OSA who presented after mechanical fall.  In the ER, found to have a right hip fracture.           Assessment & Plan:  Subcapital right hip fracture - Consult orthopedics, appreciate expert cares - Continue scheduled acetaminophen, and as needed oxycodone and hydromorphone - 50% weightbearing on right leg   Paroxysmal atrial fibrillation Acquired thrombophilia Remains in sinus - Continue amiodarone and Eliquis  ESRD Interdialytic hypotension - Consult Nephrology for maintenance HD - Continue midodrine   HIV Well-controlled, undetectable last February - Continue [name]    Stable or resolved issues:  Chronic systolic and diastolic CHF Last EF 44-03%, grade II DD.   Still appears euvolemic, asymptomatic at baseline NYHA 2.  Cerebrovascular disease, secondary prevention  Sleep apnea History of sleep apnea.   Pressure injury, stage II right buttocks, POA Pressure injury, scrotus, stage II POA -Consult WOC  Glaucoma -Continue eye drops  Anemia of CKD Hemoglobin stable postop  Gout No active flare - Continue allopurinol             Disposition: Status is: Inpatient  Remains inpatient appropriate because:Unsafe d/c plan  Dispo:  Patient From: Home  Planned Disposition: Home   Patient was admitted for hip fracture, underwent pinning 7/10, he is medically ready for transfer to SNF when safe disposition can be found   Level of care: Med-Surg       MDM: The below labs and imaging reports were reviewed and summarized above.  Medication management as above.    DVT prophylaxis: apixaban (ELIQUIS) tablet 2.5 mg Start: 06/13/21 1000 Place TED hose Start:  06/12/21 1045 apixaban (ELIQUIS) tablet 2.5 mg  Code Status: FULL Family Communication: Nephew/POA by phone        Subjective: No new headache, chest pain, dyspnea, confusion, nausea.  Pain in his leg is improved, only hurts when he moves.  Objective: Vitals:   06/14/21 0926 06/14/21 1000 06/14/21 1016 06/14/21 1701  BP: (!) 115/56 124/66 118/62 112/63  Pulse: 72   69  Resp:   19 20  Temp:   (!) 97.4 F (36.3 C) 98.2 F (36.8 C)  TempSrc:   Oral Oral  SpO2:   99% 96%  Weight:   84.7 kg   Height:        Intake/Output Summary (Last 24 hours) at 06/14/2021 1847 Last data filed at 06/14/2021 1422 Gross per 24 hour  Intake 710 ml  Output 2000 ml  Net -1290 ml   Filed Weights   06/12/21 0719 06/14/21 0700 06/14/21 1016  Weight: 83.2 kg 86.7 kg 84.7 kg    Examination: General appearance: Elderly adult male, lying in bed, no acute distress, appears tired.     HEENT: Icteric, conjunctival pink, lids and lashes normal.  No nasal deformity, discharge, or epistaxis.  Lips moist, dentition normal, oropharynx moist, no oral lesions Skin:  Cardiac: RRR, no murmurs, no lower extremity edema Respiratory: Normal respiratory rate and rhythm, lungs diminished without rales or wheezes Abdomen: Abdomen soft without tenderness palpation or guarding, no ascites or distention. MSK:  Neuro: Awake, sleepy, moves upper extremities with generalized weakness but normal coordination, speech fluent Psych: Seems sleepy and subdued but  oriented to person, place, and time.  Attention normal, affect blunted.    Data Reviewed: I have personally reviewed following labs and imaging studies:  CBC: Recent Labs  Lab 06/10/21 1837 06/11/21 0444 06/13/21 0142 06/14/21 0035  WBC 7.6 6.8 8.8 9.0  NEUTROABS 6.1  --   --   --   HGB 10.7* 10.6* 10.1* 10.6*  HCT 32.7* 32.2* 30.5* 32.5*  MCV 110.1* 107.3* 108.9* 107.6*  PLT 160 151 132* 850   Basic Metabolic Panel: Recent Labs  Lab 06/10/21 1837  06/11/21 0444 06/13/21 0142 06/14/21 0035  NA 137 136 138 134*  K 3.7 3.8 4.7 4.9  CL 92* 93* 98 95*  CO2 31 30 27 25   GLUCOSE 134* 120* 135* 122*  BUN 29* 32* 51* 69*  CREATININE 7.37* 7.96* 7.66* 9.22*  CALCIUM 9.3 9.2 9.1 9.0  MG  --  2.2  --   --   PHOS  --  5.4*  --   --    GFR: Estimated Creatinine Clearance: 7.6 mL/min (A) (by C-G formula based on SCr of 9.22 mg/dL (H)). Liver Function Tests: Recent Labs  Lab 06/13/21 0142  AST 16  ALT 20  ALKPHOS 125  BILITOT 0.8  PROT 6.5  ALBUMIN 3.2*   No results for input(s): LIPASE, AMYLASE in the last 168 hours. No results for input(s): AMMONIA in the last 168 hours. Coagulation Profile: Recent Labs  Lab 06/10/21 1837  INR 1.3*   Cardiac Enzymes: No results for input(s): CKTOTAL, CKMB, CKMBINDEX, TROPONINI in the last 168 hours. BNP (last 3 results) No results for input(s): PROBNP in the last 8760 hours. HbA1C: No results for input(s): HGBA1C in the last 72 hours. CBG: No results for input(s): GLUCAP in the last 168 hours. Lipid Profile: No results for input(s): CHOL, HDL, LDLCALC, TRIG, CHOLHDL, LDLDIRECT in the last 72 hours. Thyroid Function Tests: No results for input(s): TSH, T4TOTAL, FREET4, T3FREE, THYROIDAB in the last 72 hours. Anemia Panel: No results for input(s): VITAMINB12, FOLATE, FERRITIN, TIBC, IRON, RETICCTPCT in the last 72 hours. Urine analysis:    Component Value Date/Time   COLORURINE YELLOW 11/30/2020 2354   APPEARANCEUR CLOUDY (A) 11/30/2020 2354   LABSPEC 1.011 11/30/2020 2354   PHURINE 9.0 (H) 11/30/2020 2354   GLUCOSEU NEGATIVE 11/30/2020 2354   HGBUR SMALL (A) 11/30/2020 2354   BILIRUBINUR NEGATIVE 11/30/2020 2354   BILIRUBINUR negative 04/16/2013 1447   KETONESUR NEGATIVE 11/30/2020 2354   PROTEINUR 100 (A) 11/30/2020 2354   UROBILINOGEN 0.2 05/11/2014 1352   NITRITE NEGATIVE 11/30/2020 2354   LEUKOCYTESUR LARGE (A) 11/30/2020 2354   Sepsis  Labs: @LABRCNTIP (procalcitonin:4,lacticacidven:4)  ) Recent Results (from the past 240 hour(s))  SARS CORONAVIRUS 2 (TAT 6-24 HRS) Nasopharyngeal Nasopharyngeal Swab     Status: None   Collection Time: 06/11/21  1:14 AM   Specimen: Nasopharyngeal Swab  Result Value Ref Range Status   SARS Coronavirus 2 NEGATIVE NEGATIVE Final    Comment: (NOTE) SARS-CoV-2 target nucleic acids are NOT DETECTED.  The SARS-CoV-2 RNA is generally detectable in upper and lower respiratory specimens during the acute phase of infection. Negative results do not preclude SARS-CoV-2 infection, do not rule out co-infections with other pathogens, and should not be used as the sole basis for treatment or other patient management decisions. Negative results must be combined with clinical observations, patient history, and epidemiological information. The expected result is Negative.  Fact Sheet for Patients: SugarRoll.be  Fact Sheet for Healthcare Providers: https://www.woods-mathews.com/  This test  is not yet approved or cleared by the [place] and  has been authorized for detection and/or diagnosis of SARS-CoV-2 by FDA under an Emergency Use Authorization (EUA). This EUA will remain  in effect (meaning this test can be used) for the duration of the COVID-19 declaration under Se ction 564(b)(1) of the Act, 21 U.S.C. section 360bbb-3(b)(1), unless the authorization is terminated or revoked sooner.  Performed at [facility], [place] [address] [place], [place] [address]   Surgical pcr screen     Status: None   Collection Time: 06/11/21 11:19 PM   Specimen: Nasal Mucosa; Nasal Swab  Result Value Ref Range Status   MRSA, PCR NEGATIVE NEGATIVE Final   Staphylococcus aureus NEGATIVE NEGATIVE Final    Comment: (NOTE) The Xpert SA Assay (FDA approved for NASAL specimens in patients 44 years of age and older), is one component of a  comprehensive surveillance program. It is not intended to diagnose infection nor to guide or monitor treatment. Performed at [facility], [place] [address], [place],  [address]          Radiology Studies: No results found.      Scheduled Meds:  acetaminophen  1,000 mg Oral TID   allopurinol  100 mg Oral Once per day on Tue Thu Sat   amiodarone  200 mg Oral Daily   apixaban  2.5 mg Oral BID   bictegravir-emtricitabine-tenofovir AF  1 tablet Oral Daily   brimonidine  1 drop Right Eye TID   Chlorhexidine Gluconate Cloth  6 each Topical Daily   Chlorhexidine Gluconate Cloth  6 each Topical Q0600   dorzolamide-timolol  1 drop Right Eye BID   midodrine       midodrine  10 mg Oral 2 times per day on Tue Thu Sat   montelukast  10 mg Oral QHS   multivitamin  1 tablet Oral QPM   ofloxacin  1 drop Right Eye QID   pantoprazole  40 mg Oral Daily   Continuous Infusions:  sodium chloride 10 mL/hr at 06/12/21 1323   sodium chloride 10 mL/hr at 06/12/21 1318   promethazine (PHENERGAN) injection (IM or IVPB)       LOS: 4 days    Time spent: 25 minutes    Edwin Dada, MD [facility] 06/14/2021, 6:47 PM     Please page though [place] or Epic secure chat:  For [employer], Adult nurse

## 2021-06-14 NOTE — Progress Notes (Signed)
RT placed patient on CPAP with 2L O2 bled into circuit. Patient tolerating well at this time. RT will monitor as needed. 

## 2021-06-15 LAB — BASIC METABOLIC PANEL
Anion gap: 15 (ref 5–15)
BUN: 41 mg/dL — ABNORMAL HIGH (ref 8–23)
CO2: 25 mmol/L (ref 22–32)
Calcium: 8.6 mg/dL — ABNORMAL LOW (ref 8.9–10.3)
Chloride: 96 mmol/L — ABNORMAL LOW (ref 98–111)
Creatinine, Ser: 6.84 mg/dL — ABNORMAL HIGH (ref 0.61–1.24)
GFR, Estimated: 8 mL/min — ABNORMAL LOW (ref >60)
Glucose, Bld: 87 mg/dL (ref 70–99)
Potassium: 4.3 mmol/L (ref 3.5–5.1)
Sodium: 136 mmol/L (ref 135–145)

## 2021-06-15 LAB — CBC
HCT: 29.6 % — ABNORMAL LOW (ref 39.0–52.0)
Hemoglobin: 9.6 g/dL — ABNORMAL LOW (ref 13.0–17.0)
MCH: 35.3 pg — ABNORMAL HIGH (ref 26.0–34.0)
MCHC: 32.4 g/dL (ref 30.0–36.0)
MCV: 108.8 fL — ABNORMAL HIGH (ref 80.0–100.0)
Platelets: 162 10*3/uL (ref 150–400)
RBC: 2.72 MIL/uL — ABNORMAL LOW (ref 4.22–5.81)
RDW: 15.8 % — ABNORMAL HIGH (ref 11.5–15.5)
WBC: 6.7 10*3/uL (ref 4.0–10.5)
nRBC: 0 % (ref 0.0–0.2)

## 2021-06-15 MED ORDER — DARBEPOETIN ALFA 60 MCG/0.3ML IJ SOSY
60.0000 ug | PREFILLED_SYRINGE | INTRAMUSCULAR | Status: DC
  Filled 2021-06-15: qty 0.3

## 2021-06-15 MED ORDER — CHLORHEXIDINE GLUCONATE CLOTH 2 % EX PADS
6.0000 | MEDICATED_PAD | Freq: Every day | CUTANEOUS | Status: DC
  Administered 2021-06-16: 6 via TOPICAL

## 2021-06-15 MED ORDER — OXYCODONE HCL 5 MG PO TABS: 5.0000 mg | ORAL_TABLET | Freq: Four times a day (QID) | ORAL | 0 refills | Status: AC | PRN

## 2021-06-15 MED ORDER — POLYETHYLENE GLYCOL 3350 17 G PO PACK
17.0000 g | PACK | Freq: Two times a day (BID) | ORAL | Status: AC
  Administered 2021-06-15: 17 g via ORAL

## 2021-06-15 NOTE — Progress Notes (Signed)
[facility] Progress Note   Subjective:   Patient seen and examined at bedside in room.  Reports epigastric pain and nausea this AM.  Not vomiting but does have a sour taste in his mouth.  States it occurs every morning and feels like acid reflux. Denies CP, SOB, diarrhea and fatigue.    Objective Vitals:   06/14/21 2319 06/15/21 0217 06/15/21 0503 06/15/21 0831  BP:  132/81 123/73 126/73  Pulse: 78 75 71 73  Resp: 16 18 17 18   Temp:  (!) 97.4 F (36.3 C) 97.6 F (36.4 C) 97.8 F (36.6 C)  TempSrc:  Oral Oral   SpO2: 99% 99% 96% 100%  Weight:      Height:       Physical Exam General:well appearing male in NAD Heart:RRR, no mrg Lungs:CTAB, nml WOB Abdomen:NTND, +BS Extremities:no LE edema Dialysis Access: RU AVG +b/t   Filed Weights   06/12/21 0719 06/14/21 0700 06/14/21 1016  Weight: 83.2 kg 86.7 kg 84.7 kg    Intake/Output Summary (Last 24 hours) at 06/15/2021 1144 Last data filed at 06/15/2021 0508 Gross per 24 hour  Intake 480 ml  Output 0 ml  Net 480 ml    Additional Objective Labs: Basic Metabolic Panel: Recent Labs  Lab 06/11/21 0444 06/13/21 0142 06/14/21 0035 06/15/21 0156  NA 136 138 134* 136  K 3.8 4.7 4.9 4.3  CL 93* 98 95* 96*  CO2 30 27 25 25   GLUCOSE 120* 135* 122* 87  BUN 32* 51* 69* 41*  CREATININE 7.96* 7.66* 9.22* 6.84*  CALCIUM 9.2 9.1 9.0 8.6*  PHOS 5.4*  --   --   --    Liver Function Tests: Recent Labs  Lab 06/13/21 0142  AST 16  ALT 20  ALKPHOS 125  BILITOT 0.8  PROT 6.5  ALBUMIN 3.2*   CBC: Recent Labs  Lab 06/10/21 1837 06/11/21 0444 06/13/21 0142 06/14/21 0035 06/15/21 0156  WBC 7.6 6.8 8.8 9.0 6.7  NEUTROABS 6.1  --   --   --   --   HGB 10.7* 10.6* 10.1* 10.6* 9.6*  HCT 32.7* 32.2* 30.5* 32.5* 29.6*  MCV 110.1* 107.3* 108.9* 107.6* 108.8*  PLT 160 151 132* 168 162    Medications:  sodium chloride 10 mL/hr at 06/12/21 1323   sodium chloride 10 mL/hr at 06/12/21 1318   promethazine  (PHENERGAN) injection (IM or IVPB)      acetaminophen  1,000 mg Oral TID   allopurinol  100 mg Oral Once per day on Tue Thu Sat   amiodarone  200 mg Oral Daily   apixaban  2.5 mg Oral BID   bictegravir-emtricitabine-tenofovir AF  1 tablet Oral Daily   brimonidine  1 drop Right Eye TID   Chlorhexidine Gluconate Cloth  6 each Topical Daily   Chlorhexidine Gluconate Cloth  6 each Topical Q0600   dorzolamide-timolol  1 drop Right Eye BID   midodrine  10 mg Oral 2 times per day on Tue Thu Sat   montelukast  10 mg Oral QHS   multivitamin  1 tablet Oral QPM   ofloxacin  1 drop Right Eye QID   pantoprazole  40 mg Oral Daily   polyethylene glycol  17 g Oral BID    Dialysis Orders: Mon/Tues/Thurs/Sat - [facility]  4hrs, BFR 400, DFR 1.5 AutoFlow,  EDW 84kg, 2K/ 2Ca   Heparin 2000 unit bolus Q HD Mircera 75 mcg q2wks - last 06/02/21 Venofer 50mg  IV weekly-last  06/07/21 Hectorol 54mcg IV qHD-last 06/09/21   Home meds: Velphoro 500mg  chewable 1 tab TID with meals and BID PRN with snacks Midodrine 10mg  TID on HD days (Mon/Tues/Thurs/Sat)   Assessment/Plan: Right Subcapital Hip Fracture- s/p right hip pinning on 06/12/21. ESRD - on HD 4x weekly Mon/Tues/Thurs/Sat d/t frequent hospitalizations for volume overload and recently meeting EDW.  Volume status currently stable. Plan for HD on TTS schedule while admitted.  Hold heparin x1 week for recent surgery. K 4.3. Orders written for HD tomorrow if remains admitted.  Can return to regular dialysis unit is discharged.    Hypertension/volume  - BP in goal today.  On midodrine for hypotension with HD. Does not appear volume overloaded. Close to EDW if weights correct.  UF as tolerated.    Anemia of CKD - Hgb 9.6 -ESA due 06/16/21 -continue to follow.   Secondary Hyperparathyroidism - Last Ca and phos in goal.  Continue VDRA and binders.  Nutrition - Renal diet with fluid restriction. Protein supplements. Chronic systolic/diastolic  HF HIV A fib  GERD -  on protonix  Jen Mow, PA-C [employer] 06/15/2021,11:44 AM  LOS: 5 days

## 2021-06-15 NOTE — Progress Notes (Signed)
Pt placed on cpap 

## 2021-06-15 NOTE — Progress Notes (Signed)
Palliative: Mr. Mcadams is lying quietly in bed.  He greets me making but not keeping eye contact.  He is alert and oriented, able to make his basic needs known.  There is no family at bedside at this time.  Nursing staff is at bedside giving medicines and assisting with food choices.  We talked about his acute hip fracture and repair.  He tells me that he is agreeable to go to short-term rehab, but [facility] would be his choice.  We also talked about outpatient palliative services to follow, he requests that I speak with his wife.  Call to wife, Awad Gladd.  We talked about Mr. Bostic acute concern, his hip fracture repair.  We talked about short-term rehab and Mrs. Littler request [facility], if possible, as he has been to this facility in the past.  We talked about the need for some of his medicines to be provided from home.  Mrs. Turman states that this was how they managed his medicines when he went to rehab in the past.  I asked her which medicines they took and she replied "his HIV meds".  She is aware that they would need to provide these from home.  We talked about his chronic illness burden, and palliative medicine services as an outpatient extra layer of support.  At this point they are agreeable to outpatient palliative services to continue discussions about the "what if's and maybe's".  [facility] provider of choice.  Conference with attending, bedside nursing staff, transition of care team related to patient condition, needs, goals of care, disposition.  Plan:   Continue to treat the treatable.  Short-term rehab at [facility] if accepted, outpatient palliative services with [facility] for further goals of care/CODE STATUS discussions.  94 minutes  [name], NP Palliative medicine team Team phone [phone] Greater than 50% of this time was spent counseling and coordinating care related to the above assessment and plan.

## 2021-06-15 NOTE — TOC Initial Note (Addendum)
Transition of Care [facility]) - Initial/Assessment Note    Patient Details  Name: David [name] MRN: [id number] Date of Birth: [birth year]  Transition of Care [facility]) CM/SW Contact:    Emeterio Reeve, [place] Phone Number: 06/15/2021, 10:52 AM  Clinical Narrative:                  CSW spoke to pt at bedside and wife by phone. Pt stated that PTA h lived at home with his wife. Pt reports he walks with a walker. Pt reports he can feed himself but he needs help with ADL's. Pt wife reports that there is an aide that helps him with bathing and greeting dressed, his son also helps with this when needed. Pts wife reports he helps him with other ADL's as needed.   CSW reviewed PT/OT reccs. Pt and wife agree that pt needs SNF Pt reports he has been to [facility] in the past and would like to return because it is close to their home ad makes it easier for his wife to visit.   CSW informed pt and wife that he has ben acepted to [place] and [facility]. Pts wife would like to hear back from [facility] before making a decision. CSW called [facility], the will review pt and call csw back.   CSW confirmed that pt is managed by [facility] an the SNF ill have to start the insurance auth.   3:55pm- [facility] able to a bed and pts wife has accepted offer. Pt insurance Josem Kaufmann will need to be started by [facility]. Pt will need a covid test closer to discharge.   Expected Discharge Plan: Skilled Nursing Facility Barriers to Discharge: Continued Medical Work up, Ship broker   Patient Goals and CMS Choice Patient states their goals for this hospitalization and ongoing recovery are:: to get better CMS Medicare.gov Compare Post Acute Care list provided to:: Patient Choice offered to / list presented to : Spouse  Expected Discharge Plan and Services Expected Discharge Plan: [place]       Living arrangements for the past 2 months: Single Family Home                                       Prior Living Arrangements/Services Living arrangements for the past 2 months: Single Family Home Lives with:: Spouse   Do you feel safe going back to the place where you live?: Yes      Need for Family Participation in Patient Care: Yes (Comment) Care giver support system in place?: Yes (comment)      Activities of Daily Living      Permission Sought/Granted Permission sought to share information with : Family Supports, Chartered certified accountant granted to share information with : Yes, Verbal Permission Granted     Permission granted to share info w AGENCY: SNF        Emotional Assessment Appearance:: Appears stated age Attitude/Demeanor/Rapport: Engaged Affect (typically observed): Appropriate Orientation: : Oriented to Self, Oriented to Place, Oriented to  Time, Oriented to Situation Alcohol / Substance Use: Not Applicable Psych Involvement: No (comment)  Admission diagnosis:  Trauma [T14.90XA] ESRD (end stage renal disease) ([place]) [N18.6] Closed subcapital fracture of right femur, initial encounter ([place]) [S72.011A] S/P right hip fracture [Z87.81] Patient Active Problem List   Diagnosis Date Noted   Subcapital fracture of hip, right, closed, initial encounter ([place])  06/14/2021    Class: Acute   S/P right hip fracture 06/10/2021   Gait disorder 26/37/8588   Complication of vascular dialysis catheter 09/29/2020   Disease of gallbladder, unspecified 05/18/2020   Elevated LFTs    Abdominal pain    Transaminitis 05/15/2020   Full code status 02/03/2020   Subtherapeutic international normalized ratio (INR) 01/24/2020   Pressure injury of skin 01/07/2020   Combined systolic and diastolic heart failure ([place])    COVID-19 12/11/2019   Acute respiratory failure with hypoxemia ([place]) 11/01/2019   Closed fracture of nasal bones 09/10/2019   Hyperkalemia 09/02/2019   Shortness of breath 04/12/2019   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    Dependence on renal dialysis ([place]) 03/27/2019   Hypertensive heart and chronic kidney disease with heart failure and with stage 5 chronic kidney disease, or end stage renal disease ([place]) 03/27/2019   Mesenteric ischemia (HCC)    Colonic ulcer    Right lower quadrant pain    Abnormal CT of the abdomen    Ischemic colitis ([place])    Colitis presumed infectious 03/09/2019   Acute respiratory failure with hypoxia and hypercapnia (HCC) 03/04/2019   Pruritus, unspecified 02/21/2019   Acute pulmonary edema (HCC) 02/11/2019   Pain, unspecified 01/21/2019   Paroxysmal atrial fibrillation ([place]) 11/16/2018   LBBB (left bundle branch block) 11/16/2018   Pulmonary edema 10/21/2018   Gout 10/21/2018   Hypertensive emergency 10/21/2018   Chronic combined systolic and diastolic heart failure, NYHA class 3 ([place]) 10/21/2018   Leukocytosis 10/21/2018   Gastroesophageal reflux disease 08/28/2018   CAD (coronary artery disease) 04/03/2018   Palliative care encounter 03/27/2018   Atrial fibrillation, chronic ([place]) 03/25/2018   Anemia in chronic kidney disease 03/12/2018   Coagulation defect, unspecified ([place]) 03/12/2018   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/12/2018   Palliative care by specialist    Advance care planning    Goals of care, counseling/discussion    Respiratory arrest ([place])    Acute on chronic systolic heart failure ([place])    Chronic obstructive pulmonary disease ([place])    HIV (human immunodeficiency virus infection) ([place])    Pneumonia    Aspiration pneumonia of both lower lobes due to regurgitated food ([place])    Sepsis due to pneumonia ([place]) 11/22/2017   AIDS ([place]) 05/14/2014   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   Gastroparesis 05/06/2014   Protein-calorie malnutrition, severe ([place]) 05/05/2014   ESRD on hemodialysis ([place]) 05/02/2014   Hemodialysis-associated hypotension 05/02/2014   Leaking of conjunctival drainage bleb 05/01/2014   POAG (primary open-angle glaucoma)  05/01/2014   Loss of weight 04/29/2014   Conjunctivitis 04/29/2014   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   Low back pain 04/09/2012   Osteoarthritis of hip 12/29/2011   Iron deficiency anemia 06/28/2011   Macrocytic anemia 04/02/2011   ADENOCARCINOMA, PROSTATE, 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   Hyperlipidemia 11/30/2010   Transient cerebral ischemia 11/30/2010   HYPERGLYCEMIA 11/30/2010   PCP:  [name], NP Pharmacy:   [facility] [place], [address] AT [address] [place] [address] Phone: [phone] Fax: [facility], [place] AT [address] [place] [address] Phone: [phone] Fax: [phone]     Social Determinants of Health (SDOH) Interventions    Readmission Risk Interventions Readmission Risk Prevention Plan 12/03/2020 01/09/2020 09/02/2019  Transportation Screening Complete Complete Complete  Medication Review Press photographer) Complete Referral to Pharmacy Complete  PCP or Specialist appointment within 3-5 days of discharge Complete Not Complete -  PCP/Specialist Appt Not Complete comments - discharge to SNF -  [place] or Home Care Consult Complete Not Complete -  [place] or Home Care Consult Pt Refusal Comments - discharge to SNF -  SW Recovery Care/Counseling Consult Complete Complete -  Palliative Care Screening Complete Complete Not Applicable  Skilled Nursing Facility Complete Complete -  Some recent data might be hidden

## 2021-06-15 NOTE — Progress Notes (Signed)
Patient reports pain to his left mid rib area. Was given Oxy earlier for pain but this pain came back around 0220 and PRN Dilaudid was administered. When asked, he stated that the pain started yesterday and is new onset.V/S was taken: T97.4 BP132/81 PR75 RR18 99%room air. Patient is not in acute distress, no SOB/DOB noted. Maretta Los made aware. EKG done and J. Daniels,NP and C.Hall,MD notified abt the results and placed in the chart.  No new orders received at this time. Will continue to monitor patient.

## 2021-06-15 NOTE — Progress Notes (Signed)
[facility] PROGRESS NOTE    Progress Note  [facility]  MVH:[id number] DOB: Oct 26, 1941 DOA: 06/10/2021 PCP: [name], NP     Brief Narrative:   David [name] is an 80 y.o. male past medical history of end-stage renal disease on Tuesday Thursdays and Saturdays, HIV paroxysmal atrial fibrillation on Eliquis, chronic systolic heart failure history of cardiac arrest stroke and obstructive sleep apnea presents with a mechanical fall  Assessment/Plan:   Subcapital fracture of hip, right, closed, initial encounter [facility]) Orthopedic surgery has been consulted.  She is status post cannulated right hip pinning 50% weightbearing. Continue narcotics for pain control.  Paroxysmal atrial fibrillation: Sinus rhythm continue amiodarone and Eliquis.  End-stage renal disease: Continue midodrine nephrology to continue to dialyze.  HIV: Continue current regimen no changes made.  Chronic systolic heart failure: With an EF of 20% appears euvolemic continue current regimen.  Sleep apnea: Continues to CPAP at night.  Stage II right buttock pressure ulcer present on admission: Continue wound care per instructions.  Anemia of chronic disease: Hemoglobin stable.  Gout: Continue allopurinol.   DVT prophylaxis: lovenxo Family Communication:none Status is: Inpatient  Remains inpatient appropriate because:Hemodynamically unstable  Dispo:  Patient From: Home  Planned Disposition: [place]  Medically stable for discharge: No          Code Status:     Code Status Orders  (From admission, onward)           Start     Ordered   06/12/21 1045  Full code  Continuous        06/12/21 1044           Code Status History     Date Active Date Inactive Code Status Order ID Comments User Context   06/10/2021 2229 06/12/2021 1045 Full Code [id number]  Kayleen Memos, DO ED   11/30/2020 1851 12/03/2020 2218 Full Code [id number]  Marcelyn Bruins, MD ED   05/15/2020 0228 05/18/2020 2023 Full Code [id number]  Etta Quill, DO ED   01/24/2020 1127 01/27/2020 1644 DNR [id number]  Norval Morton, MD ED   01/24/2020 1020 01/24/2020 1127 Full Code [id number]  Norval Morton, MD ED   12/15/2019 1325 01/10/2020 0134 DNR [id number]  Fuller Canada, PA-C Inpatient   12/11/2019 2024 12/15/2019 1325 Full Code [id number]  [name], MD ED   11/01/2019 0841 11/02/2019 1604 Full Code [id number]  Vianne Bulls, MD Inpatient   09/02/2019 0717 09/03/2019 1520 Full Code [id number]  Norval Morton, MD ED   03/25/2019 0417 03/26/2019 2011 Full Code [id number]  Vianne Bulls, MD ED   03/09/2019 0338 03/18/2019 1812 Full Code [id number]  Elwyn Reach, MD Inpatient   03/04/2019 0735 03/06/2019 2321 Full Code [id number]  Roxanne Mins, MD ED   02/11/2019 0203 02/12/2019 1524 Full Code [id number]  Vianne Bulls, MD ED   12/05/2018 0033 12/06/2018 2016 Full Code [id number]  Rise Patience, MD ED   11/01/2018 0918 11/02/2018 1340 Full Code [id number]  Karmen Bongo, MD ED   10/21/2018 2316 10/23/2018 1508 Full Code [id number]  Ivor Costa, MD ED   02/17/2018 0434 03/09/2018 1851 Full Code [id number]  Omar Person, NP ED   01/21/2018 0405 01/22/2018 1802 Full Code [id number]  Vianne Bulls, MD ED   11/24/2017 1934 12/01/2017 1621 Full Code [id number]  Omar Person, NP Inpatient   11/23/2017 0004 11/24/2017 1934 Full Code [id number]  Vianne Bulls,  MD ED   05/12/2014 2022 05/15/2014 2106 Full Code [id number]  Marybelle Killings, MD Inpatient   05/02/2014 1642 05/12/2014 2022 Full Code [id number]  Geradine Girt, DO Inpatient         IV Access:   Peripheral IV   Procedures and diagnostic studies:   No results found.   Medical Consultants:   None.   Subjective:    [facility] pain controlled.  Objective:    Vitals:   06/14/21 2319 06/15/21 0217 06/15/21 0503 06/15/21 0831  BP:  132/81 123/73 126/73  Pulse: 78 75 71 73   Resp: 16 18 17 18   Temp:  (!) 97.4 F (36.3 C) 97.6 F (36.4 C) 97.8 F (36.6 C)  TempSrc:  Oral Oral   SpO2: 99% 99% 96% 100%  Weight:      Height:       SpO2: 100 % O2 Flow Rate (L/min): 2 L/min   Intake/Output Summary (Last 24 hours) at 06/15/2021 0952 Last data filed at 06/15/2021 0508 Gross per 24 hour  Intake 480 ml  Output 2000 ml  Net -1520 ml   Filed Weights   06/12/21 0719 06/14/21 0700 06/14/21 1016  Weight: 83.2 kg 86.7 kg 84.7 kg    Exam: General exam: In no acute distress. Respiratory system: Good air movement and clear to auscultation. Cardiovascular system: S1 & S2 heard, RRR. No JVD. Gastrointestinal system: Abdomen is nondistended, soft and nontender.  Extremities: No pedal edema. Skin: No rashes, lesions or ulcers Psychiatry: Judgement and insight appear normal. Mood & affect appropriate.    Data Reviewed:    Labs: Basic Metabolic Panel: Recent Labs  Lab 06/10/21 1837 06/11/21 0444 06/13/21 0142 06/14/21 0035 06/15/21 0156  NA 137 136 138 134* 136  K 3.7 3.8 4.7 4.9 4.3  CL 92* 93* 98 95* 96*  CO2 31 30 27 25 25   GLUCOSE 134* 120* 135* 122* 87  BUN 29* 32* 51* 69* 41*  CREATININE 7.37* 7.96* 7.66* 9.22* 6.84*  CALCIUM 9.3 9.2 9.1 9.0 8.6*  MG  --  2.2  --   --   --   PHOS  --  5.4*  --   --   --    GFR Estimated Creatinine Clearance: 10.2 mL/min (A) (by C-G formula based on SCr of 6.84 mg/dL (H)). Liver Function Tests: Recent Labs  Lab 06/13/21 0142  AST 16  ALT 20  ALKPHOS 125  BILITOT 0.8  PROT 6.5  ALBUMIN 3.2*   No results for input(s): LIPASE, AMYLASE in the last 168 hours. No results for input(s): AMMONIA in the last 168 hours. Coagulation profile Recent Labs  Lab 06/10/21 1837  INR 1.3*   COVID-19 Labs  No results for input(s): DDIMER, FERRITIN, LDH, CRP in the last 72 hours.  Lab Results  Component Value Date   SARSCOV2NAA NEGATIVE 06/11/2021   SARSCOV2NAA NEGATIVE 11/30/2020   [place] NEGATIVE  08/10/2020   [place] NEGATIVE 07/27/2020    CBC: Recent Labs  Lab 06/10/21 1837 06/11/21 0444 06/13/21 0142 06/14/21 0035 06/15/21 0156  WBC 7.6 6.8 8.8 9.0 6.7  NEUTROABS 6.1  --   --   --   --   HGB 10.7* 10.6* 10.1* 10.6* 9.6*  HCT 32.7* 32.2* 30.5* 32.5* 29.6*  MCV 110.1* 107.3* 108.9* 107.6* 108.8*  PLT 160 151 132* 168 162   Cardiac Enzymes: No results for input(s): CKTOTAL, CKMB, CKMBINDEX, TROPONINI in the last 168 hours. BNP (last 3 results) No results for input(s):  PROBNP in the last 8760 hours. CBG: No results for input(s): GLUCAP in the last 168 hours. D-Dimer: No results for input(s): DDIMER in the last 72 hours. Hgb A1c: No results for input(s): HGBA1C in the last 72 hours. Lipid Profile: No results for input(s): CHOL, HDL, LDLCALC, TRIG, CHOLHDL, LDLDIRECT in the last 72 hours. Thyroid function studies: No results for input(s): TSH, T4TOTAL, T3FREE, THYROIDAB in the last 72 hours.  Invalid input(s): FREET3 Anemia work up: No results for input(s): VITAMINB12, FOLATE, FERRITIN, TIBC, IRON, RETICCTPCT in the last 72 hours. Sepsis Labs: Recent Labs  Lab 06/11/21 0444 06/13/21 0142 06/14/21 0035 06/15/21 0156  WBC 6.8 8.8 9.0 6.7   Microbiology Recent Results (from the past 240 hour(s))  SARS CORONAVIRUS 2 (TAT 6-24 HRS) Nasopharyngeal Nasopharyngeal Swab     Status: None   Collection Time: 06/11/21  1:14 AM   Specimen: Nasopharyngeal Swab  Result Value Ref Range Status   SARS Coronavirus 2 NEGATIVE NEGATIVE Final    Comment: (NOTE) SARS-CoV-2 target nucleic acids are NOT DETECTED.  The SARS-CoV-2 RNA is generally detectable in upper and lower respiratory specimens during the acute phase of infection. Negative results do not preclude SARS-CoV-2 infection, do not rule out co-infections with other pathogens, and should not be used as the sole basis for treatment or other patient management decisions. Negative results must be combined with  clinical observations, patient history, and epidemiological information. The expected result is Negative.  Fact Sheet for Patients: SugarRoll.be  Fact Sheet for Healthcare Providers: https://www.woods-mathews.com/  This test is not yet approved or cleared by the [place] FDA and  has been authorized for detection and/or diagnosis of SARS-CoV-2 by FDA under an Emergency Use Authorization (EUA). This EUA will remain  in effect (meaning this test can be used) for the duration of the COVID-19 declaration under Se ction 564(b)(1) of the Act, 21 U.S.C. section 360bbb-3(b)(1), unless the authorization is terminated or revoked sooner.  Performed at [facility], [place] [address] [place], [place] [address]   Surgical pcr screen     Status: None   Collection Time: 06/11/21 11:19 PM   Specimen: Nasal Mucosa; Nasal Swab  Result Value Ref Range Status   MRSA, PCR NEGATIVE NEGATIVE Final   Staphylococcus aureus NEGATIVE NEGATIVE Final    Comment: (NOTE) The Xpert SA Assay (FDA approved for NASAL specimens in patients 61 years of age and older), is one component of a comprehensive surveillance program. It is not intended to diagnose infection nor to guide or monitor treatment. Performed at [facility], [place] [address], [place], [place] [address]      Medications:    acetaminophen  1,000 mg Oral TID   allopurinol  100 mg Oral Once per day on Tue Thu Sat   amiodarone  200 mg Oral Daily   apixaban  2.5 mg Oral BID   bictegravir-emtricitabine-tenofovir AF  1 tablet Oral Daily   brimonidine  1 drop Right Eye TID   Chlorhexidine Gluconate Cloth  6 each Topical Daily   Chlorhexidine Gluconate Cloth  6 each Topical Q0600   dorzolamide-timolol  1 drop Right Eye BID   midodrine  10 mg Oral 2 times per day on Tue Thu Sat   montelukast  10 mg Oral QHS   multivitamin  1 tablet Oral QPM   ofloxacin  1 drop Right Eye QID   pantoprazole   40 mg Oral Daily   Continuous Infusions:  sodium chloride 10 mL/hr at  06/12/21 1323   sodium chloride 10 mL/hr at 06/12/21 1318   promethazine (PHENERGAN) injection (IM or IVPB)        LOS: 5 days   Charlynne Cousins  [facility]  06/15/2021, 9:52 AM

## 2021-06-15 NOTE — Progress Notes (Signed)
Writer removed CPAP per patient's request.

## 2021-06-16 LAB — CBC
HCT: 31.8 % — ABNORMAL LOW (ref 39.0–52.0)
Hemoglobin: 10.4 g/dL — ABNORMAL LOW (ref 13.0–17.0)
MCH: 35.3 pg — ABNORMAL HIGH (ref 26.0–34.0)
MCHC: 32.7 g/dL (ref 30.0–36.0)
MCV: 107.8 fL — ABNORMAL HIGH (ref 80.0–100.0)
Platelets: 189 10*3/uL (ref 150–400)
RBC: 2.95 MIL/uL — ABNORMAL LOW (ref 4.22–5.81)
RDW: 15.6 % — ABNORMAL HIGH (ref 11.5–15.5)
WBC: 6.1 10*3/uL (ref 4.0–10.5)
nRBC: 0 % (ref 0.0–0.2)

## 2021-06-16 LAB — RENAL FUNCTION PANEL
Albumin: 3.3 g/dL — ABNORMAL LOW (ref 3.5–5.0)
Anion gap: 15 (ref 5–15)
BUN: 60 mg/dL — ABNORMAL HIGH (ref 8–23)
CO2: 24 mmol/L (ref 22–32)
Calcium: 8.9 mg/dL (ref 8.9–10.3)
Chloride: 96 mmol/L — ABNORMAL LOW (ref 98–111)
Creatinine, Ser: 9.5 mg/dL — ABNORMAL HIGH (ref 0.61–1.24)
GFR, Estimated: 5 mL/min — ABNORMAL LOW (ref >60)
Glucose, Bld: 127 mg/dL — ABNORMAL HIGH (ref 70–99)
Phosphorus: 6.9 mg/dL — ABNORMAL HIGH (ref 2.5–4.6)
Potassium: 4.3 mmol/L (ref 3.5–5.1)
Sodium: 135 mmol/L (ref 135–145)

## 2021-06-16 LAB — RESP PANEL BY RT-PCR (FLU A&B, COVID) ARPGX2
Influenza A by PCR: NEGATIVE
Influenza B by PCR: NEGATIVE
SARS Coronavirus 2 by RT PCR: NEGATIVE

## 2021-06-16 MED ORDER — MIDODRINE HCL 5 MG PO TABS
ORAL_TABLET | ORAL | Status: AC
  Administered 2021-06-16: 10 mg via ORAL
  Filled 2021-06-16: qty 2

## 2021-06-16 MED ORDER — LIDOCAINE HCL (PF) 1 % IJ SOLN: 5.0000 mL | INTRAMUSCULAR | Status: DC | PRN

## 2021-06-16 MED ORDER — SODIUM CHLORIDE 0.9 % IV SOLN: 100.0000 mL | INTRAVENOUS | Status: DC | PRN

## 2021-06-16 MED ORDER — HEPARIN SODIUM (PORCINE) 1000 UNIT/ML DIALYSIS
1000.0000 [IU] | INTRAMUSCULAR | Status: DC | PRN
  Filled 2021-06-16: qty 1

## 2021-06-16 MED ORDER — ALTEPLASE 2 MG IJ SOLR: 2.0000 mg | Freq: Once | INTRAMUSCULAR | Status: DC | PRN

## 2021-06-16 MED ORDER — PENTAFLUOROPROP-TETRAFLUOROETH EX AERO: 1.0000 "application " | INHALATION_SPRAY | CUTANEOUS | Status: DC | PRN

## 2021-06-16 MED ORDER — DARBEPOETIN ALFA 60 MCG/0.3ML IJ SOSY
PREFILLED_SYRINGE | INTRAMUSCULAR | Status: AC
  Administered 2021-06-16: 60 ug via INTRAVENOUS
  Filled 2021-06-16: qty 0.3

## 2021-06-16 MED ORDER — LIDOCAINE-PRILOCAINE 2.5-2.5 % EX CREA
1.0000 "application " | TOPICAL_CREAM | CUTANEOUS | Status: DC | PRN
  Filled 2021-06-16: qty 5

## 2021-06-16 NOTE — Plan of Care (Signed)

## 2021-06-16 NOTE — TOC Progression Note (Addendum)
Transition of Care [facility]) - Progression Note    Patient Details  Name: David [name] MRN: [id number] Date of Birth: 03-08-1941  Transition of Care [facility]) CM/SW Contact  Emeterio Reeve, [place] Phone Number: 06/16/2021, 1:31 PM  Clinical Narrative:     CSW confirmed with MD that will not need cpap on discharge. CSW gave information to [facility]. [facility] started insurance auth for pt. Pt ready to DC when Josem Kaufmann is received. CSW requested covid test from MD.   3:11pm- CSW received call from [facility] requesting updated clinicals. CSW faxed clinicals to [phone].  4:30pm- [facility] received insurance auth and can accept pt in the morning. CSW requested covid test from MD.   Expected Discharge Plan: [place] Barriers to Discharge: Continued Medical Work up, Ship broker  Expected Discharge Plan and Services Expected Discharge Plan: [place]       Living arrangements for the past 2 months: Single Family Home Expected Discharge Date: 06/16/21                                     Social Determinants of Health (SDOH) Interventions    Readmission Risk Interventions Readmission Risk Prevention Plan 12/03/2020 01/09/2020 09/02/2019  Transportation Screening Complete Complete Complete  Medication Review Press photographer) Complete Referral to Pharmacy Complete  PCP or Specialist appointment within 3-5 days of discharge Complete Not Complete -  PCP/Specialist Appt Not Complete comments - discharge to SNF -  [place] or [place] Complete Not Complete -  [place] or [place] Pt Refusal Comments - discharge to SNF -  SW Recovery Care/Counseling Consult Complete Complete -  Palliative Care Screening Complete Complete Not Applicable  Skilled Nursing Facility Complete Complete -  Some recent data might be hidden   Emeterio Reeve, Latanya Presser, [place] Social Worker [phone]

## 2021-06-16 NOTE — Discharge Summary (Signed)
Physician Discharge Summary  [facility] NOM:[id number] DOB: Sep 28, 1941 DOA: 06/10/2021  PCP: [name], NP  Admit date: 06/10/2021 Discharge date: 06/16/2021  Admitted From: Home Disposition:  SNF  Recommendations for Outpatient Follow-up:  Follow up with PCP in 1-2 weeks Please obtain BMP/CBC in one week   Home Health:No Equipment/Devices:None  Discharge Condition:Stable CODE STATUS:Full Diet recommendation: Heart Healthy   Brief/Interim Summary: 80 y.o. male past medical history of end-stage renal disease on Tuesday Thursdays and Saturdays, HIV paroxysmal atrial fibrillation on Eliquis, chronic systolic heart failure history of cardiac arrest stroke and obstructive sleep apnea presents with a mechanical fall  Discharge Diagnoses:  Principal Problem:   Subcapital fracture of hip, right, closed, initial encounter [facility]) Active Problems:   S/P right hip fracture  Subcapital fracture of the right hip: Orthopedic surgery was consulted he status post cannulation of the right hip with pinning on 06/14/2021. Physical therapy evaluated the patient and recommended skilled nursing facility.  Paroxysmal atrial fibrillation: In sinus rhythm continue amiodarone and Eliquis.  End-stage renal disease: Nephrology was consulted he will continue his dialysis Tuesday Thursdays and Saturdays.  HIV: Continue current regimen no changes made.  Chronic systolic heart failure: Appears to be euvolemic with an EF of 20%, changes made to his medication.  Sleep apnea: He does not wear CPAP at home.  Stage II right buttock pressure ulcer present on admission: Continue care per wound care.  Anemia of chronic disease/renal disease: Hemoglobin stable continue to monitor as an outpatient.  Gout: Continue allopurinol.  Discharge Instructions  Discharge Instructions     Diet - low sodium heart healthy   Complete by: As directed    Increase activity slowly   Complete by: As  directed    No wound care   Complete by: As directed       Allergies as of 06/16/2021       Reactions   Dextromethorphan-guaifenesin Other (See Comments)   Unknown reaction   Tocotrienols Other (See Comments)   Unknown reaction   Losartan Potassium Other (See Comments)   Causes constipation        Medication List     TAKE these medications    acetaminophen 500 MG tablet Commonly known as: TYLENOL Take 1,000 mg by mouth every 6 (six) hours as needed for moderate pain.   allopurinol 100 MG tablet Commonly known as: ZYLOPRIM TAKE 1 TABLET BY MOUTH 4 TIMES WEEKLY( MONDAY, [place]) AFTER DIALYSIS What changed:  how much to take how to take this when to take this   amiodarone 200 MG tablet Commonly known as: PACERONE TAKE 1 TABLET(200 MG) BY MOUTH DAILY What changed:  how much to take how to take this when to take this additional instructions   [name] 50-200-25 MG Tabs tablet Generic drug: bictegravir-emtricitabine-tenofovir AF TAKE 1 TABLET BY MOUTH DAILY   brimonidine 0.1 % Soln Commonly known as: ALPHAGAN P Place 1 drop into the right eye 2 (two) times daily.   dorzolamide-timolol 22.3-6.8 MG/ML ophthalmic solution Commonly known as: COSOPT Place 1 drop into the right eye 2 (two) times daily.   Eliquis 5 MG Tabs tablet Generic drug: apixaban TAKE 1 TABLET(5 MG) BY MOUTH TWICE DAILY What changed:  how much to take how to take this when to take this additional instructions   HECTOROL IV Dialysis days Monday,Tuesday,Thursday and saturday   heparin 1000 unit/mL Soln injection Heparin Sodium (Porcine) 1,000 Units/mL Catheter Lock Arterial   midodrine 10 MG tablet Commonly known as:  PROAMATINE Take 1 tablet (10 mg total) by mouth as directed. Twice a day on the day of HD Tuesday, Thursday, Saturday (1 in AM and 1 at [place]). Take 1/2 tablet (5mg ) a day on non HD days What changed:  how much to take when to take  this additional instructions   MIRCERA IJ Dialysis days Monday,Tuesday,Thursday and saturday   montelukast 10 MG tablet Commonly known as: SINGULAIR TAKE 1 TABLET(10 MG) BY MOUTH AT BEDTIME What changed: See the new instructions.   multivitamin Tabs tablet Take 1 tablet by mouth every evening.   ofloxacin 0.3 % ophthalmic solution Commonly known as: OCUFLOX Place 1 drop into the right eye 4 (four) times daily.   oxyCODONE 5 MG immediate release tablet Commonly known as: Oxy IR/ROXICODONE Take 1 tablet (5 mg total) by mouth every 6 (six) hours as needed for moderate pain or breakthrough pain.   pantoprazole 40 MG tablet Commonly known as: PROTONIX TAKE 1 TABLET(40 MG) BY MOUTH DAILY   Velphoro 500 MG chewable tablet Generic drug: sucroferric oxyhydroxide Chew 500 mg by mouth 3 (three) times daily as needed (with meals).        Follow-up Information     Jessy Oto, MD Follow up.   Specialty: Orthopedic Surgery Contact information: [place] [address] [phone]                Allergies  Allergen Reactions   Dextromethorphan-Guaifenesin Other (See Comments)    Unknown reaction   Tocotrienols Other (See Comments)    Unknown reaction   Losartan Potassium Other (See Comments)    Causes constipation    Consultations: Orthopedic surgery Nephrology   Procedures/Studies: DG Chest 1 View  Result Date: 06/10/2021 CLINICAL DATA:  Recent fall with known right hip fracture, initial encounter EXAM: CHEST  1 VIEW COMPARISON:  11/30/2020 FINDINGS: Cardiac shadow is enlarged but stable. Left dialysis catheter has been removed in the interval. Lungs are clear without evidence of focal infiltrate or effusion. No pneumothorax is noted. No acute bony abnormality is seen. Vascular stenting is again noted right arm. IMPRESSION: Stable cardiomegaly.  No acute abnormality noted. Electronically Signed   By: Inez Catalina M.D.   On: 06/10/2021 19:42   DG  C-Arm 1-60 Min  Result Date: 06/12/2021 CLINICAL DATA:  RIGHT hip pinning. EXAM: OPERATIVE RIGHT HIP (WITH PELVIS IF PERFORMED) 3 VIEWS TECHNIQUE: Fluoroscopic spot image(s) were submitted for interpretation post-operatively. COMPARISON:  Plain film of the pelvis and RIGHT hip dated 06/10/2021. FINDINGS: Intraoperative fluoroscopic images showing 3 fixation screws traversing the RIGHT femoral neck fracture site. Screws appear intact and appropriately positioned. Fluoroscopy provided for 46 seconds. IMPRESSION: Intraoperative films demonstrating screw fixation of the RIGHT femoral neck fracture. Hardware appears intact and appropriately positioned. Electronically Signed   By: Franki Cabot M.D.   On: 06/12/2021 10:14   DG HIP OPERATIVE UNILAT WITH PELVIS RIGHT  Result Date: 06/12/2021 CLINICAL DATA:  RIGHT hip pinning. EXAM: OPERATIVE RIGHT HIP (WITH PELVIS IF PERFORMED) 3 VIEWS TECHNIQUE: Fluoroscopic spot image(s) were submitted for interpretation post-operatively. COMPARISON:  Plain film of the pelvis and RIGHT hip dated 06/10/2021. FINDINGS: Intraoperative fluoroscopic images showing 3 fixation screws traversing the RIGHT femoral neck fracture site. Screws appear intact and appropriately positioned. Fluoroscopy provided for 46 seconds. IMPRESSION: Intraoperative films demonstrating screw fixation of the RIGHT femoral neck fracture. Hardware appears intact and appropriately positioned. Electronically Signed   By: Franki Cabot M.D.   On: 06/12/2021 10:14   DG  Hip Unilat With Pelvis 2-3 Views Right  Result Date: 06/10/2021 CLINICAL DATA:  Recent fall with right hip pain, initial encounter EXAM: DG HIP (WITH OR WITHOUT PELVIS) 3V RIGHT COMPARISON:  12/15/2020 FINDINGS: There are changes consistent with an undisplaced subcapital femoral neck fracture on the right. Very mild impaction at the fracture site is noted. These changes are new from the prior exam. Pelvic ring is intact. Severe degenerative changes  of left hip joint are again noted. Prostate seeds are again seen. IMPRESSION: Subcapital right femoral neck fracture with very mild impaction at the fracture site. Electronically Signed   By: Inez Catalina M.D.   On: 06/10/2021 19:41   (Echo, Carotid, EGD, Colonoscopy, ERCP)    Subjective: No complaints  Discharge Exam: Vitals:   06/16/21 0900 06/16/21 0930  BP: (!) (P) 141/73 (P) 139/75  Pulse: (P) 80 (P) 78  Resp:    Temp:    SpO2:     Vitals:   06/16/21 0807 06/16/21 0830 06/16/21 0900 06/16/21 0930  BP: (P) 131/71 (!) (P) 141/76 (!) (P) 141/73 (P) 139/75  Pulse: (P) 83 (P) 78 (P) 80 (P) 78  Resp:      Temp: (P) 98.4 F (36.9 C)     TempSrc: (P) Oral     SpO2:      Weight: (P) 82.6 kg     Height:        General: Pt is alert, awake, not in acute distress Cardiovascular: RRR, S1/S2 +, no rubs, no gallops Respiratory: CTA bilaterally, no wheezing, no rhonchi Abdominal: Soft, NT, ND, bowel sounds + Extremities: no edema, no cyanosis    The results of significant diagnostics from this hospitalization (including imaging, microbiology, ancillary and laboratory) are listed below for reference.     Microbiology: Recent Results (from the past 240 hour(s))  SARS CORONAVIRUS 2 (TAT 6-24 HRS) Nasopharyngeal Nasopharyngeal Swab     Status: None   Collection Time: 06/11/21  1:14 AM   Specimen: Nasopharyngeal Swab  Result Value Ref Range Status   SARS Coronavirus 2 NEGATIVE NEGATIVE Final    Comment: (NOTE) SARS-CoV-2 target nucleic acids are NOT DETECTED.  The SARS-CoV-2 RNA is generally detectable in upper and lower respiratory specimens during the acute phase of infection. Negative results do not preclude SARS-CoV-2 infection, do not rule out co-infections with other pathogens, and should not be used as the sole basis for treatment or other patient management decisions. Negative results must be combined with clinical observations, patient history, and epidemiological  information. The expected result is Negative.  Fact Sheet for Patients: SugarRoll.be  Fact Sheet for Healthcare Providers: https://www.woods-mathews.com/  This test is not yet approved or cleared by the [place] FDA and  has been authorized for detection and/or diagnosis of SARS-CoV-2 by FDA under an Emergency Use Authorization (EUA). This EUA will remain  in effect (meaning this test can be used) for the duration of the COVID-19 declaration under Se ction 564(b)(1) of the Act, 21 U.S.C. section 360bbb-3(b)(1), unless the authorization is terminated or revoked sooner.  Performed at [facility], [place] [address] [place], [place] [address]   Surgical pcr screen     Status: None   Collection Time: 06/11/21 11:19 PM   Specimen: Nasal Mucosa; Nasal Swab  Result Value Ref Range Status   MRSA, PCR NEGATIVE NEGATIVE Final   Staphylococcus aureus NEGATIVE NEGATIVE Final    Comment: (NOTE) The Xpert SA Assay (FDA approved for NASAL specimens in patients 22 years of  age and older), is one component of a comprehensive surveillance program. It is not intended to diagnose infection nor to guide or monitor treatment. Performed at [facility], [place] [address], [place], [place] [address]      Labs: BNP (last 3 results) Recent Labs    11/30/20 1430  BNP 3,500.9*   Basic Metabolic Panel: Recent Labs  Lab 06/11/21 0444 06/13/21 0142 06/14/21 0035 06/15/21 0156 06/16/21 0747  NA 136 138 134* 136 135  K 3.8 4.7 4.9 4.3 4.3  CL 93* 98 95* 96* 96*  CO2 30 27 25 25 24   GLUCOSE 120* 135* 122* 87 127*  BUN 32* 51* 69* 41* 60*  CREATININE 7.96* 7.66* 9.22* 6.84* 9.50*  CALCIUM 9.2 9.1 9.0 8.6* 8.9  MG 2.2  --   --   --   --   PHOS 5.4*  --   --   --  6.9*   Liver Function Tests: Recent Labs  Lab 06/13/21 0142 06/16/21 0747  AST 16  --   ALT 20  --   ALKPHOS 125  --   BILITOT 0.8  --   PROT 6.5  --   ALBUMIN 3.2* 3.3*    No results for input(s): LIPASE, AMYLASE in the last 168 hours. No results for input(s): AMMONIA in the last 168 hours. CBC: Recent Labs  Lab 06/10/21 1837 06/11/21 0444 06/13/21 0142 06/14/21 0035 06/15/21 0156 06/16/21 0747  WBC 7.6 6.8 8.8 9.0 6.7 6.1  NEUTROABS 6.1  --   --   --   --   --   HGB 10.7* 10.6* 10.1* 10.6* 9.6* 10.4*  HCT 32.7* 32.2* 30.5* 32.5* 29.6* 31.8*  MCV 110.1* 107.3* 108.9* 107.6* 108.8* 107.8*  PLT 160 151 132* 168 162 189   Cardiac Enzymes: No results for input(s): CKTOTAL, CKMB, CKMBINDEX, TROPONINI in the last 168 hours. BNP: Invalid input(s): POCBNP CBG: No results for input(s): GLUCAP in the last 168 hours. D-Dimer No results for input(s): DDIMER in the last 72 hours. Hgb A1c No results for input(s): HGBA1C in the last 72 hours. Lipid Profile No results for input(s): CHOL, HDL, LDLCALC, TRIG, CHOLHDL, LDLDIRECT in the last 72 hours. Thyroid function studies No results for input(s): TSH, T4TOTAL, T3FREE, THYROIDAB in the last 72 hours.  Invalid input(s): FREET3 Anemia work up No results for input(s): VITAMINB12, FOLATE, FERRITIN, TIBC, IRON, RETICCTPCT in the last 72 hours. Urinalysis    Component Value Date/Time   COLORURINE YELLOW 11/30/2020 2354   APPEARANCEUR CLOUDY (A) 11/30/2020 2354   LABSPEC 1.011 11/30/2020 2354   PHURINE 9.0 (H) 11/30/2020 2354   GLUCOSEU NEGATIVE 11/30/2020 2354   HGBUR SMALL (A) 11/30/2020 2354   BILIRUBINUR NEGATIVE 11/30/2020 2354   BILIRUBINUR negative 04/16/2013 1447   KETONESUR NEGATIVE 11/30/2020 2354   PROTEINUR 100 (A) 11/30/2020 2354   UROBILINOGEN 0.2 05/11/2014 1352   NITRITE NEGATIVE 11/30/2020 2354   LEUKOCYTESUR LARGE (A) 11/30/2020 2354   Sepsis Labs Invalid input(s): PROCALCITONIN,  WBC,  LACTICIDVEN Microbiology Recent Results (from the past 240 hour(s))  SARS CORONAVIRUS 2 (TAT 6-24 HRS) Nasopharyngeal Nasopharyngeal Swab     Status: None   Collection Time: 06/11/21  1:14 AM    Specimen: Nasopharyngeal Swab  Result Value Ref Range Status   SARS Coronavirus 2 NEGATIVE NEGATIVE Final    Comment: (NOTE) SARS-CoV-2 target nucleic acids are NOT DETECTED.  The SARS-CoV-2 RNA is generally detectable in upper and lower respiratory specimens during the acute phase of infection.  Negative results do not preclude SARS-CoV-2 infection, do not rule out co-infections with other pathogens, and should not be used as the sole basis for treatment or other patient management decisions. Negative results must be combined with clinical observations, patient history, and epidemiological information. The expected result is Negative.  Fact Sheet for Patients: SugarRoll.be  Fact Sheet for Healthcare Providers: https://www.woods-mathews.com/  This test is not yet approved or cleared by the [place] FDA and  has been authorized for detection and/or diagnosis of SARS-CoV-2 by FDA under an Emergency Use Authorization (EUA). This EUA will remain  in effect (meaning this test can be used) for the duration of the COVID-19 declaration under Se ction 564(b)(1) of the Act, 21 U.S.C. section 360bbb-3(b)(1), unless the authorization is terminated or revoked sooner.  Performed at [facility], [place] [address] [place], [place] [address]   Surgical pcr screen     Status: None   Collection Time: 06/11/21 11:19 PM   Specimen: Nasal Mucosa; Nasal Swab  Result Value Ref Range Status   MRSA, PCR NEGATIVE NEGATIVE Final   Staphylococcus aureus NEGATIVE NEGATIVE Final    Comment: (NOTE) The Xpert SA Assay (FDA approved for NASAL specimens in patients 52 years of age and older), is one component of a comprehensive surveillance program. It is not intended to diagnose infection nor to guide or monitor treatment. Performed at [facility], [place] [address], [place], [place] [address]      Time coordinating discharge: Over 30  minutes  SIGNED:   Charlynne Cousins, MD  [facility] 06/16/2021, 10:09 AM Pager   If 7PM-7AM, please contact night-coverage [email or web address] Password [email or web address]

## 2021-06-16 NOTE — Progress Notes (Signed)
[facility] Progress Note   Subjective:   Patient seen and examined today during dialysis.  Tolerating treatment well so far.  Reports he is going to SNF today. Denies CP, SOB, n/v/d, abdominal pain and fatigue.    Objective Vitals:   06/16/21 0900 06/16/21 0930 06/16/21 1000 06/16/21 1030  BP: (!) 141/73 139/75 140/79 (!) 143/80  Pulse: 80 78 81 79  Resp:      Temp:      TempSrc:      SpO2:      Weight:      Height:       Physical Exam General:well appearing male in NAD Heart:RRR, no mrg Lungs:CTAB, nml WOB Abdomen:soft, NTND Extremities:no LE edema Dialysis Access: RU AVG +b/t   Filed Weights   06/14/21 0700 06/14/21 1016 06/16/21 0807  Weight: 86.7 kg 84.7 kg 82.6 kg    Intake/Output Summary (Last 24 hours) at 06/16/2021 1132 Last data filed at 06/15/2021 2300 Gross per 24 hour  Intake 440 ml  Output --  Net 440 ml    Additional Objective Labs: Basic Metabolic Panel: Recent Labs  Lab 06/11/21 0444 06/13/21 0142 06/14/21 0035 06/15/21 0156 06/16/21 0747  NA 136   < > 134* 136 135  K 3.8   < > 4.9 4.3 4.3  CL 93*   < > 95* 96* 96*  CO2 30   < > 25 25 24   GLUCOSE 120*   < > 122* 87 127*  BUN 32*   < > 69* 41* 60*  CREATININE 7.96*   < > 9.22* 6.84* 9.50*  CALCIUM 9.2   < > 9.0 8.6* 8.9  PHOS 5.4*  --   --   --  6.9*   < > = values in this interval not displayed.   Liver Function Tests: Recent Labs  Lab 06/13/21 0142 06/16/21 0747  AST 16  --   ALT 20  --   ALKPHOS 125  --   BILITOT 0.8  --   PROT 6.5  --   ALBUMIN 3.2* 3.3*   CBC: Recent Labs  Lab 06/10/21 1837 06/11/21 0444 06/13/21 0142 06/14/21 0035 06/15/21 0156 06/16/21 0747  WBC 7.6 6.8 8.8 9.0 6.7 6.1  NEUTROABS 6.1  --   --   --   --   --   HGB 10.7* 10.6* 10.1* 10.6* 9.6* 10.4*  HCT 32.7* 32.2* 30.5* 32.5* 29.6* 31.8*  MCV 110.1* 107.3* 108.9* 107.6* 108.8* 107.8*  PLT 160 151 132* 168 162 189   Medications:  sodium chloride 10 mL/hr at 06/12/21 1323    sodium chloride 10 mL/hr at 06/12/21 1318   sodium chloride     sodium chloride     promethazine (PHENERGAN) injection (IM or IVPB)      acetaminophen  1,000 mg Oral TID   allopurinol  100 mg Oral Once per day on Tue Thu Sat   amiodarone  200 mg Oral Daily   apixaban  2.5 mg Oral BID   bictegravir-emtricitabine-tenofovir AF  1 tablet Oral Daily   brimonidine  1 drop Right Eye TID   Chlorhexidine Gluconate Cloth  6 each Topical Daily   Chlorhexidine Gluconate Cloth  6 each Topical Q0600   Chlorhexidine Gluconate Cloth  6 each Topical Q0600   Darbepoetin Alfa       darbepoetin (ARANESP) injection - DIALYSIS  60 mcg Intravenous Q Thu-HD   dorzolamide-timolol  1 drop Right Eye BID   midodrine  10 mg Oral 2  times per day on Tue Thu Sat   montelukast  10 mg Oral QHS   multivitamin  1 tablet Oral QPM   ofloxacin  1 drop Right Eye QID   pantoprazole  40 mg Oral Daily   polyethylene glycol  17 g Oral BID    Dialysis Orders: Mon/Tues/Thurs/Sat - [facility]  4hrs, BFR 400, DFR 1.5 AutoFlow,  EDW 84kg, 2K/ 2Ca   Heparin 2000 unit bolus Q HD Mircera 75 mcg q2wks - last 06/02/21 Venofer 50mg  IV weekly-last 06/07/21 Hectorol 25mcg IV qHD-last 06/09/21   Home meds: Velphoro 500mg  chewable 1 tab TID with meals and BID PRN with snacks Midodrine 10mg  TID on HD days (Mon/Tues/Thurs/Sat)   Assessment/Plan: Right Subcapital Hip Fracture- s/p right hip pinning on 06/12/21. ESRD - on HD 4x weekly Mon/Tues/Thurs/Sat d/t frequent hospitalizations for volume overload and recently meeting EDW.  Volume status currently stable. Plan for HD on TTS schedule while admitted.  Hold heparin x1 week for recent surgery. K 4.3. HD today per regular schedule.  Next HD on 7/16. Hypertension/volume  - BP ok. On midodrine for hypotension with HD. Does not appear volume overloaded. Has been getting under EDW, may need to be lowered.  UF as tolerated.    Anemia of CKD - Hgb 10.4. aranesp 43mcg qwk ordered  for today.   Secondary Hyperparathyroidism - Ca in goal, phos elevated.  Continue VDRA and binders.  Nutrition - Renal diet with fluid restriction. Protein supplements. Chronic systolic/diastolic HF HIV A fib  GERD -  on protonix Dispo - to d/c to SNF.  [place] for d/c from renal perspective.   Jen Mow, PA-C [facility] 06/16/2021,11:32 AM  LOS: 6 days

## 2021-06-16 NOTE — Progress Notes (Signed)
Physical Therapy Treatment Patient Details Name: David [name] MRN: [id number] DOB: November 19, 1941 Today's Date: 06/16/2021    History of Present Illness 80 yo male sustained a fall on 7/8 and dx with R nondisplaced subcapital hip fracture. Pt received ORIF on 7/10, permitted 57% WB on R hip on cannulated hip pinning.  PMHx:  OA L hip, ESRD, glaucoma, HIV, HTN, PNA, prostate CA, CKD5, Covid 19, a-fib, cardiac arrest, stroke, bradycardia, cardiomyopathy    PT Comments    Pt supine in bed.  Noticeably fatigue from dialysis but agreeable to PT session.  Increased time needed and performed transfer training in [place].  Pt continues to benefit from skilled rehab in a post acute setting.     Follow Up Recommendations  SNF     Equipment Recommendations  None recommended by PT    Recommendations for Other Services       Precautions / Restrictions Precautions Precautions: Fall Restrictions Weight Bearing Restrictions: Yes RLE Weight Bearing: Partial weight bearing Other Position/Activity Restrictions: WBAT LLE    Mobility  Bed Mobility Overal bed mobility: Needs Assistance Bed Mobility: Supine to Sit;Sit to Supine     Supine to sit: Max assist     General bed mobility comments: Pt supine in bed on arrival.  He was able to minimally move LEs but required heavy max assistance to elevate trunk into a seated position.    Transfers Overall transfer level: Needs assistance Equipment used: Ambulation equipment used (sara stedy) Transfers: Sit to/from Stand Sit to Stand: Mod assist;From elevated surface         General transfer comment: Pt required assistance to shift weight of trunk forward to grip bar on sara stedy.  Mod assistance to boost into standing. Pt performed x 2 standing trials around 20-30 sec.  Ambulation/Gait Ambulation/Gait assistance:  (NT)               Stairs             Wheelchair Mobility    Modified Rankin (Stroke Patients Only)        Balance Overall balance assessment: Needs assistance;History of Falls   Sitting balance-Leahy Scale: Poor       Standing balance-Leahy Scale: Fair                              Cognition Arousal/Alertness: Awake/alert Behavior During Therapy: Flat affect Overall Cognitive Status: No family/caregiver present to determine baseline cognitive functioning                                        Exercises General Exercises - Lower Extremity Ankle Circles/Pumps: AROM;Both;10 reps;Supine Hip ABduction/ADduction: AAROM;Both;10 reps;Supine    General Comments        Pertinent Vitals/Pain Pain Assessment: Faces Faces Pain Scale: Hurts little more Pain Location: R hip, ribcage and sternum Pain Descriptors / Indicators: Operative site guarding;Grimacing;Guarding Pain Intervention(s): Monitored during session;Repositioned    Home Living                      Prior Function            PT Goals (current goals can now be found in the care plan section) Acute Rehab PT Goals Patient Stated Goal: to walk again Potential to Achieve Goals: Fair Progress towards PT goals: Progressing toward goals  Frequency    Min 3X/week      PT Plan Current plan remains appropriate    Co-evaluation              AM-PAC PT "6 Clicks" Mobility   Outcome Measure  Help needed turning from your back to your side while in a flat bed without using bedrails?: A Little Help needed moving from lying on your back to sitting on the side of a flat bed without using bedrails?: A Lot Help needed moving to and from a bed to a chair (including a wheelchair)?: A Lot Help needed standing up from a chair using your arms (e.g., wheelchair or bedside chair)?: A Lot Help needed to walk in hospital room?: Total Help needed climbing 3-5 steps with a railing? : Total 6 Click Score: 11    End of Session Equipment Utilized During Treatment: Gait belt Activity  Tolerance: Patient limited by fatigue;Patient limited by pain Patient left: in bed;with call bell/phone within reach;with bed alarm set (in chair position.) Nurse Communication: Mobility status PT Visit Diagnosis: Muscle weakness (generalized) (M62.81);Pain;History of falling (Z91.81);Difficulty in walking, not elsewhere classified (R26.2) Pain - part of body: Hip     Time: 3546-5681 PT Time Calculation (min) (ACUTE ONLY): 27 min  Charges:  $Therapeutic Exercise: 8-22 mins $Therapeutic Activity: 8-22 mins                    Erasmo Leventhal , PTA Acute Rehabilitation Services Pager [phone] Office [phone]    Willean Schurman Eli Hose 06/16/2021, 4:53 PM

## 2021-06-17 DIAGNOSIS — C61 Malignant neoplasm of prostate: Secondary | ICD-10-CM | POA: Diagnosis not present

## 2021-06-17 DIAGNOSIS — N2581 Secondary hyperparathyroidism of renal origin: Secondary | ICD-10-CM | POA: Diagnosis not present

## 2021-06-17 DIAGNOSIS — I420 Dilated cardiomyopathy: Secondary | ICD-10-CM | POA: Diagnosis present

## 2021-06-17 DIAGNOSIS — I2510 Atherosclerotic heart disease of native coronary artery without angina pectoris: Secondary | ICD-10-CM | POA: Diagnosis present

## 2021-06-17 DIAGNOSIS — D6859 Other primary thrombophilia: Secondary | ICD-10-CM | POA: Diagnosis not present

## 2021-06-17 DIAGNOSIS — Z66 Do not resuscitate: Secondary | ICD-10-CM | POA: Diagnosis not present

## 2021-06-17 DIAGNOSIS — S72009A Fracture of unspecified part of neck of unspecified femur, initial encounter for closed fracture: Secondary | ICD-10-CM | POA: Diagnosis not present

## 2021-06-17 DIAGNOSIS — Z20822 Contact with and (suspected) exposure to covid-19: Secondary | ICD-10-CM | POA: Diagnosis not present

## 2021-06-17 DIAGNOSIS — I132 Hypertensive heart and chronic kidney disease with heart failure and with stage 5 chronic kidney disease, or end stage renal disease: Secondary | ICD-10-CM | POA: Diagnosis not present

## 2021-06-17 DIAGNOSIS — G9341 Metabolic encephalopathy: Secondary | ICD-10-CM | POA: Diagnosis not present

## 2021-06-17 DIAGNOSIS — Y838 Other surgical procedures as the cause of abnormal reaction of the patient, or of later complication, without mention of misadventure at the time of the procedure: Secondary | ICD-10-CM | POA: Diagnosis present

## 2021-06-17 DIAGNOSIS — I5082 Biventricular heart failure: Secondary | ICD-10-CM | POA: Diagnosis present

## 2021-06-17 DIAGNOSIS — I5043 Acute on chronic combined systolic (congestive) and diastolic (congestive) heart failure: Secondary | ICD-10-CM | POA: Diagnosis not present

## 2021-06-17 DIAGNOSIS — Z743 Need for continuous supervision: Secondary | ICD-10-CM | POA: Diagnosis not present

## 2021-06-17 DIAGNOSIS — R000 Tachycardia, unspecified: Secondary | ICD-10-CM | POA: Diagnosis not present

## 2021-06-17 DIAGNOSIS — E440 Moderate protein-calorie malnutrition: Secondary | ICD-10-CM | POA: Diagnosis not present

## 2021-06-17 DIAGNOSIS — D696 Thrombocytopenia, unspecified: Secondary | ICD-10-CM | POA: Diagnosis not present

## 2021-06-17 DIAGNOSIS — I428 Other cardiomyopathies: Secondary | ICD-10-CM | POA: Diagnosis not present

## 2021-06-17 DIAGNOSIS — I9589 Other hypotension: Secondary | ICD-10-CM | POA: Diagnosis not present

## 2021-06-17 DIAGNOSIS — K219 Gastro-esophageal reflux disease without esophagitis: Secondary | ICD-10-CM | POA: Diagnosis not present

## 2021-06-17 DIAGNOSIS — I4891 Unspecified atrial fibrillation: Secondary | ICD-10-CM | POA: Diagnosis not present

## 2021-06-17 DIAGNOSIS — S72011A Unspecified intracapsular fracture of right femur, initial encounter for closed fracture: Secondary | ICD-10-CM | POA: Diagnosis not present

## 2021-06-17 DIAGNOSIS — B20 Human immunodeficiency virus [HIV] disease: Secondary | ICD-10-CM | POA: Diagnosis not present

## 2021-06-17 DIAGNOSIS — I639 Cerebral infarction, unspecified: Secondary | ICD-10-CM | POA: Diagnosis not present

## 2021-06-17 DIAGNOSIS — M84459D Pathological fracture, hip, unspecified, subsequent encounter for fracture with routine healing: Secondary | ICD-10-CM | POA: Diagnosis not present

## 2021-06-17 DIAGNOSIS — Z8781 Personal history of (healed) traumatic fracture: Secondary | ICD-10-CM | POA: Diagnosis not present

## 2021-06-17 DIAGNOSIS — R110 Nausea: Secondary | ICD-10-CM | POA: Diagnosis not present

## 2021-06-17 DIAGNOSIS — D509 Iron deficiency anemia, unspecified: Secondary | ICD-10-CM | POA: Diagnosis not present

## 2021-06-17 DIAGNOSIS — Z7901 Long term (current) use of anticoagulants: Secondary | ICD-10-CM | POA: Diagnosis not present

## 2021-06-17 DIAGNOSIS — D631 Anemia in chronic kidney disease: Secondary | ICD-10-CM | POA: Diagnosis not present

## 2021-06-17 DIAGNOSIS — G8929 Other chronic pain: Secondary | ICD-10-CM | POA: Diagnosis not present

## 2021-06-17 DIAGNOSIS — I447 Left bundle-branch block, unspecified: Secondary | ICD-10-CM | POA: Diagnosis present

## 2021-06-17 DIAGNOSIS — R4182 Altered mental status, unspecified: Secondary | ICD-10-CM | POA: Diagnosis not present

## 2021-06-17 DIAGNOSIS — Z8616 Personal history of COVID-19: Secondary | ICD-10-CM | POA: Diagnosis not present

## 2021-06-17 DIAGNOSIS — R5381 Other malaise: Secondary | ICD-10-CM | POA: Diagnosis not present

## 2021-06-17 DIAGNOSIS — I120 Hypertensive chronic kidney disease with stage 5 chronic kidney disease or end stage renal disease: Secondary | ICD-10-CM | POA: Diagnosis not present

## 2021-06-17 DIAGNOSIS — Z515 Encounter for palliative care: Secondary | ICD-10-CM | POA: Diagnosis not present

## 2021-06-17 DIAGNOSIS — R531 Weakness: Secondary | ICD-10-CM | POA: Diagnosis not present

## 2021-06-17 DIAGNOSIS — N186 End stage renal disease: Secondary | ICD-10-CM | POA: Diagnosis not present

## 2021-06-17 DIAGNOSIS — J449 Chronic obstructive pulmonary disease, unspecified: Secondary | ICD-10-CM | POA: Diagnosis present

## 2021-06-17 DIAGNOSIS — I5023 Acute on chronic systolic (congestive) heart failure: Secondary | ICD-10-CM | POA: Diagnosis not present

## 2021-06-17 DIAGNOSIS — S72001A Fracture of unspecified part of neck of right femur, initial encounter for closed fracture: Secondary | ICD-10-CM | POA: Diagnosis not present

## 2021-06-17 DIAGNOSIS — G9340 Encephalopathy, unspecified: Secondary | ICD-10-CM | POA: Diagnosis not present

## 2021-06-17 DIAGNOSIS — I953 Hypotension of hemodialysis: Secondary | ICD-10-CM | POA: Diagnosis not present

## 2021-06-17 DIAGNOSIS — R1111 Vomiting without nausea: Secondary | ICD-10-CM | POA: Diagnosis not present

## 2021-06-17 DIAGNOSIS — I4819 Other persistent atrial fibrillation: Secondary | ICD-10-CM | POA: Diagnosis present

## 2021-06-17 DIAGNOSIS — I10 Essential (primary) hypertension: Secondary | ICD-10-CM | POA: Diagnosis not present

## 2021-06-17 DIAGNOSIS — T82898A Other specified complication of vascular prosthetic devices, implants and grafts, initial encounter: Secondary | ICD-10-CM | POA: Diagnosis present

## 2021-06-17 DIAGNOSIS — R112 Nausea with vomiting, unspecified: Secondary | ICD-10-CM | POA: Diagnosis not present

## 2021-06-17 DIAGNOSIS — W19XXXD Unspecified fall, subsequent encounter: Secondary | ICD-10-CM | POA: Diagnosis present

## 2021-06-17 DIAGNOSIS — Z992 Dependence on renal dialysis: Secondary | ICD-10-CM | POA: Diagnosis not present

## 2021-06-17 DIAGNOSIS — N390 Urinary tract infection, site not specified: Secondary | ICD-10-CM | POA: Diagnosis present

## 2021-06-17 NOTE — Progress Notes (Signed)
[place] called and report was given to [employer].

## 2021-06-17 NOTE — Progress Notes (Signed)
Subjective:  No complaints this a.m. said tolerated dialysis yesterday on schedule awaiting acceptance of insurance to skilled nursing per SW note  Objective Vital signs in last 24 hours: Vitals:   06/16/21 1958 06/16/21 2309 06/17/21 0443 06/17/21 0804  BP: 131/72  (!) 142/76 (!) 148/81  Pulse: 78 74 79 80  Resp: 18 16 16 18   Temp: 99.2 F (37.3 C)  97.8 F (36.6 C) 97.7 F (36.5 C)  TempSrc: Oral  Oral Oral  SpO2: 99% 98% 100% 96%  Weight:      Height:       Weight change:   Physical Exam General:well appearing elderly male in NAD Heart:RRR, no mrg Lungs:CTAB, nml WOB Abdomen:soft, NTND Extremities:no LE edema Dialysis Access: RU AVG +b/t  Dialysis Orders: Mon/Tues/Thurs/Sat - [facility]  4hrs, BFR 400, DFR 1.5 AutoFlow,  EDW 84kg, 2K/ 2Ca   Heparin 2000 unit bolus Q HD Mircera 75 mcg q2wks - last 06/02/21 Venofer 50mg  IV weekly-last 06/07/21 Hectorol 52mcg IV qHD-last 06/09/21   Home meds: Velphoro 500mg  chewable 1 tab TID with meals and BID PRN with snacks Midodrine 10mg  TID on HD days (Mon/Tues/Thurs/Sat)   Problem/Plan: Right Subcapital Hip Fracture- s/p right hip pinning on 06/12/21.  Awaiting discharge to rehab facility ESRD - on HD 4x weekly Mon/Tues/Thurs/Sat d/t frequent hospitalizations for volume overload and recently meeting EDW.  Volume status currently stable. Plan for HD on TTS schedule while admitted.  Hold heparin x1 week for recent surgery.  Next HD on 7/16. Hypertension/volume  - BP ok. On midodrine for hypotension with HD. Does not appear volume overloaded. Has been getting under EDW, lowered.  To 80.5 kg.    Anemia of CKD - Hgb 10.4. aranesp 73mcg qwk ordered given 7/14.   Secondary Hyperparathyroidism - Ca in goal, phos elevated.  Continue VDRA and binders.  Nutrition - Renal diet with fluid restriction. Protein supplements. Chronic systolic/diastolic HF HIV A fib on amiodarone and Eliquis GERD -  on protonix Dispo - to d/c to  SNF.  [place] for d/c from renal perspective.   Ernest Haber, PA-C [facility] Beeper [phone] 06/17/2021,9:21 AM  LOS: 7 days   Labs: Basic Metabolic Panel: Recent Labs  Lab 06/11/21 0444 06/13/21 0142 06/14/21 0035 06/15/21 0156 06/16/21 0747  NA 136   < > 134* 136 135  K 3.8   < > 4.9 4.3 4.3  CL 93*   < > 95* 96* 96*  CO2 30   < > 25 25 24   GLUCOSE 120*   < > 122* 87 127*  BUN 32*   < > 69* 41* 60*  CREATININE 7.96*   < > 9.22* 6.84* 9.50*  CALCIUM 9.2   < > 9.0 8.6* 8.9  PHOS 5.4*  --   --   --  6.9*   < > = values in this interval not displayed.   Liver Function Tests: Recent Labs  Lab 06/13/21 0142 06/16/21 0747  AST 16  --   ALT 20  --   ALKPHOS 125  --   BILITOT 0.8  --   PROT 6.5  --   ALBUMIN 3.2* 3.3*   No results for input(s): LIPASE, AMYLASE in the last 168 hours. No results for input(s): AMMONIA in the last 168 hours. CBC: Recent Labs  Lab 06/10/21 1837 06/11/21 0444 06/13/21 0142 06/14/21 0035 06/15/21 0156 06/16/21 0747  WBC 7.6 6.8 8.8 9.0 6.7 6.1  NEUTROABS 6.1  --   --   --   --   --  HGB 10.7* 10.6* 10.1* 10.6* 9.6* 10.4*  HCT 32.7* 32.2* 30.5* 32.5* 29.6* 31.8*  MCV 110.1* 107.3* 108.9* 107.6* 108.8* 107.8*  PLT 160 151 132* 168 162 189   Cardiac Enzymes: No results for input(s): CKTOTAL, CKMB, CKMBINDEX, TROPONINI in the last 168 hours. CBG: No results for input(s): GLUCAP in the last 168 hours.  Studies/Results: No results found. Medications:  sodium chloride 10 mL/hr at 06/12/21 1323   sodium chloride 10 mL/hr at 06/12/21 1318   promethazine (PHENERGAN) injection (IM or IVPB)      acetaminophen  1,000 mg Oral TID   allopurinol  100 mg Oral Once per day on Tue Thu Sat   amiodarone  200 mg Oral Daily   apixaban  2.5 mg Oral BID   bictegravir-emtricitabine-tenofovir AF  1 tablet Oral Daily   brimonidine  1 drop Right Eye TID   Chlorhexidine Gluconate Cloth  6 each Topical Daily   Chlorhexidine Gluconate  Cloth  6 each Topical Q0600   Chlorhexidine Gluconate Cloth  6 each Topical Q0600   darbepoetin (ARANESP) injection - DIALYSIS  60 mcg Intravenous Q Thu-HD   dorzolamide-timolol  1 drop Right Eye BID   midodrine  10 mg Oral 2 times per day on Tue Thu Sat   montelukast  10 mg Oral QHS   multivitamin  1 tablet Oral QPM   ofloxacin  1 drop Right Eye QID   pantoprazole  40 mg Oral Daily   polyethylene glycol  17 g Oral BID

## 2021-06-17 NOTE — Plan of Care (Signed)

## 2021-06-17 NOTE — TOC Transition Note (Signed)
Transition of Care [facility]) - CM/SW Discharge Note   Patient Details  Name: David [name] MRN: [id number] Date of Birth: [birth year]  [facility]) CM/SW Contact:  Emeterio Reeve, LCSW Phone Number: 06/17/2021, 11:14 AM   Clinical Narrative:     Patient will DC to: [place] Anticipated DC date: 06/17/21 Family notified: Wife Transport by: Corey Harold     Per MD patient ready for DC to [place], patient, patient's family, and facility notified of DC. Discharge Summary and FL2 sent to facility. DC packet on chart. Ambulance transport requested for patient.    RN to call report to [phone].  CSW will sign off for now as social work intervention is no longer needed. Please consult [place] again if new needs arise.   Final next level of care: Skilled Nursing Facility Barriers to Discharge: Barriers Resolved   Patient Goals and CMS Choice Patient states their goals for this hospitalization and ongoing recovery are:: to get better CMS Medicare.gov Compare Post Acute Care list provided to:: Patient Choice offered to / list presented to : Spouse  Discharge Placement              Patient [facility] at: [facility] Patient to be transferred to facility by: ptar Name of family member notified: wife Patient and family notified of of transfer: 06/17/21  Discharge Plan and Services                                     Social Determinants of Health ([place]) Interventions     Readmission Risk Interventions Readmission Risk Prevention Plan 12/03/2020 01/09/2020 09/02/2019  Transportation Screening Complete Complete Complete  Medication Review [employer]) Complete Referral to Pharmacy Complete  PCP or Specialist appointment within 3-5 days of discharge Complete Not Complete -  PCP/Specialist Appt Not Complete comments - discharge to SNF -  [place] or [place] Complete Not Complete -  [place] or [place] Pt Refusal Comments - discharge to  SNF -  SW Recovery Care/Counseling Consult Complete Complete -  Palliative Care Screening Complete Complete Not Applicable  Skilled Nursing Facility Complete Complete -  Some recent data might be hidden    Emeterio Reeve, Latanya Presser, [place] Social Worker [phone]

## 2021-06-17 NOTE — Progress Notes (Signed)
[facility] PROGRESS NOTE    Progress Note  [facility]  JOA:[id number] DOB: [birth year] DOA: 06/10/2021 PCP: [name], NP     Brief Narrative:   David [name] is an 80 y.o. male past medical history of end-stage renal disease on Tuesday Thursdays and Saturdays, HIV paroxysmal atrial fibrillation on Eliquis, chronic systolic heart failure history of cardiac arrest stroke and obstructive sleep apnea presents with a mechanical fall  Assessment/Plan:   Subcapital fracture of hip, right, closed, initial encounter [facility]) Orthopedic surgery has been consulted.  She is status post cannulated right hip pinning Patient is medically stable to be discharged this skilled nursing facility. No changes overnight awaiting transfer to rehab facility.  Paroxysmal atrial fibrillation: Sinus rhythm continue amiodarone and Eliquis.  End-stage renal disease: Continue midodrine nephrology to continue to dialyze.  HIV: Continue current regimen no changes made.  Chronic systolic heart failure: With an EF of 20% appears euvolemic continue current regimen.  Sleep apnea: Continues to CPAP at night.  Stage II right buttock pressure ulcer present on admission: Continue wound care per instructions.  Anemia of chronic disease: Hemoglobin stable.  Gout: Continue allopurinol.   DVT prophylaxis: lovenxo Family Communication:none Status is: Inpatient  Remains inpatient appropriate because:Hemodynamically unstable  Dispo:  Patient From: Home  Planned Disposition: [place]  Medically stable for discharge: No          Code Status:     Code Status Orders  (From admission, onward)           Start     Ordered   06/12/21 1045  Full code  Continuous        06/12/21 1044           Code Status History     Date Active Date Inactive Code Status Order ID Comments User Context   06/10/2021 2229 06/12/2021 1045 Full Code [id number]  Kayleen Memos, DO ED   11/30/2020 1851 12/03/2020 2218 Full Code [id number]  Marcelyn Bruins, MD ED   05/15/2020 0228 05/18/2020 2023 Full Code [id number]  Etta Quill, DO ED   01/24/2020 1127 01/27/2020 1644 DNR [id number]  Norval Morton, MD ED   01/24/2020 1020 01/24/2020 1127 Full Code [id number]  Norval Morton, MD ED   12/15/2019 1325 01/10/2020 0134 DNR [id number]  Fuller Canada, PA-C Inpatient   12/11/2019 2024 12/15/2019 1325 Full Code [id number]  [name], MD ED   11/01/2019 0841 11/02/2019 1604 Full Code [id number]  Vianne Bulls, MD Inpatient   09/02/2019 0717 09/03/2019 1520 Full Code [id number]  Norval Morton, MD ED   03/25/2019 0417 03/26/2019 2011 Full Code [id number]  Vianne Bulls, MD ED   03/09/2019 0338 03/18/2019 1812 Full Code [id number]  Elwyn Reach, MD Inpatient   03/04/2019 0735 03/06/2019 2321 Full Code [id number]  Roxanne Mins, MD ED   02/11/2019 0203 02/12/2019 1524 Full Code [id number]  Vianne Bulls, MD ED   12/05/2018 0033 12/06/2018 2016 Full Code [id number]  Rise Patience, MD ED   11/01/2018 0918 11/02/2018 1340 Full Code [id number]  Karmen Bongo, MD ED   10/21/2018 2316 10/23/2018 1508 Full Code [id number]  Ivor Costa, MD ED   02/17/2018 0434 03/09/2018 1851 Full Code [id number]  Omar Person, NP ED   01/21/2018 0405 01/22/2018 1802 Full Code [id number]  Vianne Bulls, MD ED   11/24/2017 1934 12/01/2017 1621 Full Code [id number]  Omar Person, NP Inpatient  11/23/2017 0004 11/24/2017 1934 Full Code [id number]  Vianne Bulls, MD ED   05/12/2014 2022 05/15/2014 2106 Full Code [id number]  Marybelle Killings, MD Inpatient   05/02/2014 1642 05/12/2014 2022 Full Code [id number]  Geradine Girt, DO Inpatient         IV Access:   Peripheral IV   Procedures and diagnostic studies:   No results found.   Medical Consultants:   None.   Subjective:    [facility] no complaints  Objective:    Vitals:   06/16/21 1958 06/16/21 2309  06/17/21 0443 06/17/21 0804  BP: 131/72  (!) 142/76 (!) 148/81  Pulse: 78 74 79 80  Resp: 18 16 16 18   Temp: 99.2 F (37.3 C)  97.8 F (36.6 C) 97.7 F (36.5 C)  TempSrc: Oral  Oral Oral  SpO2: 99% 98% 100% 96%  Weight:      Height:       SpO2: 96 % O2 Flow Rate (L/min): 2 L/min   Intake/Output Summary (Last 24 hours) at 06/17/2021 0829 Last data filed at 06/17/2021 0445 Gross per 24 hour  Intake 500 ml  Output 2001 ml  Net -1501 ml    Filed Weights   06/14/21 1016 06/16/21 0807 06/16/21 1217  Weight: 84.7 kg 82.6 kg 80.6 kg    Exam: General exam: In no acute distress. Respiratory system: Good air movement and clear to auscultation. Cardiovascular system: S1 & S2 heard, RRR. No JVD. Gastrointestinal system: Abdomen is nondistended, soft and nontender.  Extremities: No pedal edema. Skin: No rashes, lesions or ulcers Psychiatry: Judgement and insight appear normal. Mood & affect appropriate.    Data Reviewed:    Labs: Basic Metabolic Panel: Recent Labs  Lab 06/11/21 0444 06/13/21 0142 06/14/21 0035 06/15/21 0156 06/16/21 0747  NA 136 138 134* 136 135  K 3.8 4.7 4.9 4.3 4.3  CL 93* 98 95* 96* 96*  CO2 30 27 25 25 24   GLUCOSE 120* 135* 122* 87 127*  BUN 32* 51* 69* 41* 60*  CREATININE 7.96* 7.66* 9.22* 6.84* 9.50*  CALCIUM 9.2 9.1 9.0 8.6* 8.9  MG 2.2  --   --   --   --   PHOS 5.4*  --   --   --  6.9*    GFR Estimated Creatinine Clearance: 7.2 mL/min (A) (by C-G formula based on SCr of 9.5 mg/dL (H)). Liver Function Tests: Recent Labs  Lab 06/13/21 0142 06/16/21 0747  AST 16  --   ALT 20  --   ALKPHOS 125  --   BILITOT 0.8  --   PROT 6.5  --   ALBUMIN 3.2* 3.3*    No results for input(s): LIPASE, AMYLASE in the last 168 hours. No results for input(s): AMMONIA in the last 168 hours. Coagulation profile Recent Labs  Lab 06/10/21 1837  INR 1.3*    COVID-19 Labs  No results for input(s): DDIMER, FERRITIN, LDH, CRP in the last 72  hours.  Lab Results  Component Value Date   SARSCOV2NAA NEGATIVE 06/16/2021   SARSCOV2NAA NEGATIVE 06/11/2021   SARSCOV2NAA NEGATIVE 11/30/2020   [place] NEGATIVE 08/10/2020    CBC: Recent Labs  Lab 06/10/21 1837 06/11/21 0444 06/13/21 0142 06/14/21 0035 06/15/21 0156 06/16/21 0747  WBC 7.6 6.8 8.8 9.0 6.7 6.1  NEUTROABS 6.1  --   --   --   --   --   HGB 10.7* 10.6* 10.1* 10.6* 9.6* 10.4*  HCT 32.7* 32.2* 30.5* 32.5* 29.6*  31.8*  MCV 110.1* 107.3* 108.9* 107.6* 108.8* 107.8*  PLT 160 151 132* 168 162 189    Cardiac Enzymes: No results for input(s): CKTOTAL, CKMB, CKMBINDEX, TROPONINI in the last 168 hours. BNP (last 3 results) No results for input(s): PROBNP in the last 8760 hours. CBG: No results for input(s): GLUCAP in the last 168 hours. D-Dimer: No results for input(s): DDIMER in the last 72 hours. Hgb A1c: No results for input(s): HGBA1C in the last 72 hours. Lipid Profile: No results for input(s): CHOL, HDL, LDLCALC, TRIG, CHOLHDL, LDLDIRECT in the last 72 hours. Thyroid function studies: No results for input(s): TSH, T4TOTAL, T3FREE, THYROIDAB in the last 72 hours.  Invalid input(s): FREET3 Anemia work up: No results for input(s): VITAMINB12, FOLATE, FERRITIN, TIBC, IRON, RETICCTPCT in the last 72 hours. Sepsis Labs: Recent Labs  Lab 06/13/21 0142 06/14/21 0035 06/15/21 0156 06/16/21 0747  WBC 8.8 9.0 6.7 6.1    Microbiology Recent Results (from the past 240 hour(s))  SARS CORONAVIRUS 2 (TAT 6-24 HRS) Nasopharyngeal Nasopharyngeal Swab     Status: None   Collection Time: 06/11/21  1:14 AM   Specimen: Nasopharyngeal Swab  Result Value Ref Range Status   SARS Coronavirus 2 NEGATIVE NEGATIVE Final    Comment: (NOTE) SARS-CoV-2 target nucleic acids are NOT DETECTED.  The SARS-CoV-2 RNA is generally detectable in upper and lower respiratory specimens during the acute phase of infection. Negative results do not preclude SARS-CoV-2 infection, do  not rule out co-infections with other pathogens, and should not be used as the sole basis for treatment or other patient management decisions. Negative results must be combined with clinical observations, patient history, and epidemiological information. The expected result is Negative.  Fact Sheet for Patients: SugarRoll.be  Fact Sheet for Healthcare Providers: https://www.woods-mathews.com/  This test is not yet approved or cleared by the [place] FDA and  has been authorized for detection and/or diagnosis of SARS-CoV-2 by FDA under an Emergency Use Authorization (EUA). This EUA will remain  in effect (meaning this test can be used) for the duration of the COVID-19 declaration under Se ction 564(b)(1) of the Act, 21 U.S.C. section 360bbb-3(b)(1), unless the authorization is terminated or revoked sooner.  Performed at [facility], [place] [address], [place], [place] [address]   Surgical pcr screen     Status: None   Collection Time: 06/11/21 11:19 PM   Specimen: Nasal Mucosa; Nasal Swab  Result Value Ref Range Status   MRSA, PCR NEGATIVE NEGATIVE Final   Staphylococcus aureus NEGATIVE NEGATIVE Final    Comment: (NOTE) The Xpert SA Assay (FDA approved for NASAL specimens in patients 60 years of age and older), is one component of a comprehensive surveillance program. It is not intended to diagnose infection nor to guide or monitor treatment. Performed at [facility], [place] [address], [place],  [address]   Resp Panel by RT-PCR (Flu A&B, Covid) Nasopharyngeal Swab     Status: None   Collection Time: 06/16/21  5:04 PM   Specimen: Nasopharyngeal Swab; Nasopharyngeal(NP) swabs in vial transport medium  Result Value Ref Range Status   SARS Coronavirus 2 by RT PCR NEGATIVE NEGATIVE Final    Comment: (NOTE) SARS-CoV-2 target nucleic acids are NOT DETECTED.  The SARS-CoV-2 RNA is generally detectable in upper  respiratory specimens during the acute phase of infection. The lowest concentration of SARS-CoV-2 viral copies this assay can detect is 138 copies/mL. A negative result does not preclude SARS-Cov-2 infection and should  not be used as the sole basis for treatment or other patient management decisions. A negative result may occur with  improper specimen collection/handling, submission of specimen other than nasopharyngeal swab, presence of viral mutation(s) within the areas targeted by this assay, and inadequate number of viral copies(<138 copies/mL). A negative result must be combined with clinical observations, patient history, and epidemiological information. The expected result is Negative.  Fact Sheet for Patients:  EntrepreneurPulse.com.au  Fact Sheet for Healthcare Providers:  IncredibleEmployment.be  This test is no t yet approved or cleared by the [place] FDA and  has been authorized for detection and/or diagnosis of SARS-CoV-2 by FDA under an Emergency Use Authorization (EUA). This EUA will remain  in effect (meaning this test can be used) for the duration of the COVID-19 declaration under Section 564(b)(1) of the Act, 21 U.S.C.section 360bbb-3(b)(1), unless the authorization is terminated  or revoked sooner.       Influenza A by PCR NEGATIVE NEGATIVE Final   Influenza B by PCR NEGATIVE NEGATIVE Final    Comment: (NOTE) The Xpert Xpress SARS-CoV-2/FLU/RSV plus assay is intended as an aid in the diagnosis of influenza from Nasopharyngeal swab specimens and should not be used as a sole basis for treatment. Nasal washings and aspirates are unacceptable for Xpert Xpress SARS-CoV-2/FLU/RSV testing.  Fact Sheet for Patients: EntrepreneurPulse.com.au  Fact Sheet for Healthcare Providers: IncredibleEmployment.be  This test is not yet approved or cleared by the [place] FDA and has been  authorized for detection and/or diagnosis of SARS-CoV-2 by FDA under an Emergency Use Authorization (EUA). This EUA will remain in effect (meaning this test can be used) for the duration of the COVID-19 declaration under Section 564(b)(1) of the Act, 21 U.S.C. section 360bbb-3(b)(1), unless the authorization is terminated or revoked.  Performed at [facility], [place] [address] [place], [place] [address]      Medications:    acetaminophen  1,000 mg Oral TID   allopurinol  100 mg Oral Once per day on Tue Thu Sat   amiodarone  200 mg Oral Daily   apixaban  2.5 mg Oral BID   bictegravir-emtricitabine-tenofovir AF  1 tablet Oral Daily   brimonidine  1 drop Right Eye TID   Chlorhexidine Gluconate Cloth  6 each Topical Daily   Chlorhexidine Gluconate Cloth  6 each Topical Q0600   Chlorhexidine Gluconate Cloth  6 each Topical Q0600   darbepoetin (ARANESP) injection - DIALYSIS  60 mcg Intravenous Q Thu-HD   dorzolamide-timolol  1 drop Right Eye BID   midodrine  10 mg Oral 2 times per day on Tue Thu Sat   montelukast  10 mg Oral QHS   multivitamin  1 tablet Oral QPM   ofloxacin  1 drop Right Eye QID   pantoprazole  40 mg Oral Daily   polyethylene glycol  17 g Oral BID   Continuous Infusions:  sodium chloride 10 mL/hr at 06/12/21 1323   sodium chloride 10 mL/hr at 06/12/21 1318   promethazine (PHENERGAN) injection (IM or IVPB)        LOS: 7 days   Charlynne Cousins  [facility]  06/17/2021, 8:29 AM

## 2021-06-17 NOTE — Plan of Care (Signed)
  Problem: Education: Goal: Knowledge of General Education information will improve Description: Including pain rating scale, medication(s)/side effects and non-pharmacologic comfort measures Outcome: Adequate for Discharge   

## 2021-06-20 ENCOUNTER — Observation Stay (HOSPITAL_COMMUNITY): Payer: Medicare HMO

## 2021-06-20 ENCOUNTER — Emergency Department (HOSPITAL_COMMUNITY): Payer: Medicare HMO

## 2021-06-20 ENCOUNTER — Inpatient Hospital Stay (HOSPITAL_COMMUNITY)
Admission: EM | Admit: 2021-06-20 | Discharge: 2021-07-04 | DRG: 291 | Disposition: E | Payer: Medicare HMO | Source: Skilled Nursing Facility | Attending: Internal Medicine | Admitting: Internal Medicine

## 2021-06-20 ENCOUNTER — Other Ambulatory Visit: Payer: Self-pay

## 2021-06-20 DIAGNOSIS — J9811 Atelectasis: Secondary | ICD-10-CM | POA: Diagnosis not present

## 2021-06-20 DIAGNOSIS — I5023 Acute on chronic systolic (congestive) heart failure: Secondary | ICD-10-CM

## 2021-06-20 DIAGNOSIS — B20 Human immunodeficiency virus [HIV] disease: Secondary | ICD-10-CM | POA: Diagnosis present

## 2021-06-20 DIAGNOSIS — I5020 Unspecified systolic (congestive) heart failure: Secondary | ICD-10-CM | POA: Diagnosis not present

## 2021-06-20 DIAGNOSIS — Z20822 Contact with and (suspected) exposure to covid-19: Secondary | ICD-10-CM | POA: Diagnosis present

## 2021-06-20 DIAGNOSIS — H409 Unspecified glaucoma: Secondary | ICD-10-CM | POA: Diagnosis present

## 2021-06-20 DIAGNOSIS — G9340 Encephalopathy, unspecified: Secondary | ICD-10-CM | POA: Diagnosis not present

## 2021-06-20 DIAGNOSIS — R509 Fever, unspecified: Secondary | ICD-10-CM | POA: Diagnosis not present

## 2021-06-20 DIAGNOSIS — Z515 Encounter for palliative care: Secondary | ICD-10-CM | POA: Diagnosis not present

## 2021-06-20 DIAGNOSIS — G9341 Metabolic encephalopathy: Secondary | ICD-10-CM | POA: Diagnosis present

## 2021-06-20 DIAGNOSIS — T82898A Other specified complication of vascular prosthetic devices, implants and grafts, initial encounter: Secondary | ICD-10-CM | POA: Diagnosis present

## 2021-06-20 DIAGNOSIS — Z87891 Personal history of nicotine dependence: Secondary | ICD-10-CM

## 2021-06-20 DIAGNOSIS — B9689 Other specified bacterial agents as the cause of diseases classified elsewhere: Secondary | ICD-10-CM | POA: Diagnosis present

## 2021-06-20 DIAGNOSIS — Z66 Do not resuscitate: Secondary | ICD-10-CM | POA: Diagnosis not present

## 2021-06-20 DIAGNOSIS — R110 Nausea: Secondary | ICD-10-CM | POA: Diagnosis present

## 2021-06-20 DIAGNOSIS — Z8616 Personal history of COVID-19: Secondary | ICD-10-CM

## 2021-06-20 DIAGNOSIS — I4819 Other persistent atrial fibrillation: Secondary | ICD-10-CM | POA: Diagnosis present

## 2021-06-20 DIAGNOSIS — I4891 Unspecified atrial fibrillation: Secondary | ICD-10-CM

## 2021-06-20 DIAGNOSIS — I10 Essential (primary) hypertension: Secondary | ICD-10-CM | POA: Diagnosis not present

## 2021-06-20 DIAGNOSIS — Z923 Personal history of irradiation: Secondary | ICD-10-CM

## 2021-06-20 DIAGNOSIS — Z7901 Long term (current) use of anticoagulants: Secondary | ICD-10-CM

## 2021-06-20 DIAGNOSIS — E440 Moderate protein-calorie malnutrition: Secondary | ICD-10-CM | POA: Diagnosis present

## 2021-06-20 DIAGNOSIS — N186 End stage renal disease: Secondary | ICD-10-CM | POA: Diagnosis present

## 2021-06-20 DIAGNOSIS — I2510 Atherosclerotic heart disease of native coronary artery without angina pectoris: Secondary | ICD-10-CM | POA: Diagnosis present

## 2021-06-20 DIAGNOSIS — J449 Chronic obstructive pulmonary disease, unspecified: Secondary | ICD-10-CM | POA: Diagnosis present

## 2021-06-20 DIAGNOSIS — I953 Hypotension of hemodialysis: Secondary | ICD-10-CM | POA: Diagnosis not present

## 2021-06-20 DIAGNOSIS — R4182 Altered mental status, unspecified: Secondary | ICD-10-CM | POA: Diagnosis not present

## 2021-06-20 DIAGNOSIS — I447 Left bundle-branch block, unspecified: Secondary | ICD-10-CM | POA: Diagnosis present

## 2021-06-20 DIAGNOSIS — D649 Anemia, unspecified: Secondary | ICD-10-CM | POA: Diagnosis not present

## 2021-06-20 DIAGNOSIS — N2581 Secondary hyperparathyroidism of renal origin: Secondary | ICD-10-CM | POA: Diagnosis present

## 2021-06-20 DIAGNOSIS — I517 Cardiomegaly: Secondary | ICD-10-CM | POA: Diagnosis not present

## 2021-06-20 DIAGNOSIS — N251 Nephrogenic diabetes insipidus: Secondary | ICD-10-CM | POA: Diagnosis not present

## 2021-06-20 DIAGNOSIS — Z992 Dependence on renal dialysis: Secondary | ICD-10-CM

## 2021-06-20 DIAGNOSIS — I420 Dilated cardiomyopathy: Secondary | ICD-10-CM | POA: Diagnosis present

## 2021-06-20 DIAGNOSIS — M109 Gout, unspecified: Secondary | ICD-10-CM | POA: Diagnosis present

## 2021-06-20 DIAGNOSIS — R112 Nausea with vomiting, unspecified: Secondary | ICD-10-CM | POA: Diagnosis not present

## 2021-06-20 DIAGNOSIS — W19XXXD Unspecified fall, subsequent encounter: Secondary | ICD-10-CM | POA: Diagnosis present

## 2021-06-20 DIAGNOSIS — Y838 Other surgical procedures as the cause of abnormal reaction of the patient, or of later complication, without mention of misadventure at the time of the procedure: Secondary | ICD-10-CM | POA: Diagnosis present

## 2021-06-20 DIAGNOSIS — I5043 Acute on chronic combined systolic (congestive) and diastolic (congestive) heart failure: Secondary | ICD-10-CM | POA: Diagnosis present

## 2021-06-20 DIAGNOSIS — D631 Anemia in chronic kidney disease: Secondary | ICD-10-CM | POA: Diagnosis present

## 2021-06-20 DIAGNOSIS — R627 Adult failure to thrive: Secondary | ICD-10-CM | POA: Diagnosis present

## 2021-06-20 DIAGNOSIS — R0602 Shortness of breath: Secondary | ICD-10-CM

## 2021-06-20 DIAGNOSIS — Z6822 Body mass index (BMI) 22.0-22.9, adult: Secondary | ICD-10-CM

## 2021-06-20 DIAGNOSIS — S72011D Unspecified intracapsular fracture of right femur, subsequent encounter for closed fracture with routine healing: Secondary | ICD-10-CM

## 2021-06-20 DIAGNOSIS — R000 Tachycardia, unspecified: Secondary | ICD-10-CM | POA: Diagnosis not present

## 2021-06-20 DIAGNOSIS — N390 Urinary tract infection, site not specified: Secondary | ICD-10-CM | POA: Diagnosis present

## 2021-06-20 DIAGNOSIS — M898X9 Other specified disorders of bone, unspecified site: Secondary | ICD-10-CM | POA: Diagnosis present

## 2021-06-20 DIAGNOSIS — I5082 Biventricular heart failure: Secondary | ICD-10-CM | POA: Diagnosis present

## 2021-06-20 DIAGNOSIS — I132 Hypertensive heart and chronic kidney disease with heart failure and with stage 5 chronic kidney disease, or end stage renal disease: Principal | ICD-10-CM | POA: Diagnosis present

## 2021-06-20 DIAGNOSIS — E785 Hyperlipidemia, unspecified: Secondary | ICD-10-CM | POA: Diagnosis present

## 2021-06-20 DIAGNOSIS — R1111 Vomiting without nausea: Secondary | ICD-10-CM | POA: Diagnosis not present

## 2021-06-20 DIAGNOSIS — Z8546 Personal history of malignant neoplasm of prostate: Secondary | ICD-10-CM

## 2021-06-20 DIAGNOSIS — T82590A Other mechanical complication of surgically created arteriovenous fistula, initial encounter: Secondary | ICD-10-CM | POA: Diagnosis not present

## 2021-06-20 DIAGNOSIS — Z8349 Family history of other endocrine, nutritional and metabolic diseases: Secondary | ICD-10-CM

## 2021-06-20 DIAGNOSIS — G4730 Sleep apnea, unspecified: Secondary | ICD-10-CM | POA: Diagnosis present

## 2021-06-20 DIAGNOSIS — Z4901 Encounter for fitting and adjustment of extracorporeal dialysis catheter: Secondary | ICD-10-CM | POA: Diagnosis not present

## 2021-06-20 DIAGNOSIS — K219 Gastro-esophageal reflux disease without esophagitis: Secondary | ICD-10-CM | POA: Diagnosis present

## 2021-06-20 DIAGNOSIS — R531 Weakness: Secondary | ICD-10-CM

## 2021-06-20 DIAGNOSIS — Z8673 Personal history of transient ischemic attack (TIA), and cerebral infarction without residual deficits: Secondary | ICD-10-CM

## 2021-06-20 DIAGNOSIS — Z8249 Family history of ischemic heart disease and other diseases of the circulatory system: Secondary | ICD-10-CM

## 2021-06-20 DIAGNOSIS — Z833 Family history of diabetes mellitus: Secondary | ICD-10-CM

## 2021-06-20 DIAGNOSIS — I428 Other cardiomyopathies: Secondary | ICD-10-CM | POA: Diagnosis present

## 2021-06-20 DIAGNOSIS — E875 Hyperkalemia: Secondary | ICD-10-CM | POA: Diagnosis present

## 2021-06-20 LAB — MAGNESIUM: Magnesium: 2.6 mg/dL — ABNORMAL HIGH (ref 1.7–2.4)

## 2021-06-20 LAB — COMPREHENSIVE METABOLIC PANEL
ALT: 7 U/L (ref 0–44)
AST: 29 U/L (ref 15–41)
Albumin: 3.2 g/dL — ABNORMAL LOW (ref 3.5–5.0)
Alkaline Phosphatase: 123 U/L (ref 38–126)
Anion gap: 17 — ABNORMAL HIGH (ref 5–15)
BUN: 56 mg/dL — ABNORMAL HIGH (ref 8–23)
CO2: 27 mmol/L (ref 22–32)
Calcium: 8.9 mg/dL (ref 8.9–10.3)
Chloride: 93 mmol/L — ABNORMAL LOW (ref 98–111)
Creatinine, Ser: 9.26 mg/dL — ABNORMAL HIGH (ref 0.61–1.24)
GFR, Estimated: 5 mL/min — ABNORMAL LOW (ref >60)
Glucose, Bld: 114 mg/dL — ABNORMAL HIGH (ref 70–99)
Potassium: 5.4 mmol/L — ABNORMAL HIGH (ref 3.5–5.1)
Sodium: 137 mmol/L (ref 135–145)
Total Bilirubin: 1.1 mg/dL (ref 0.3–1.2)
Total Protein: 7 g/dL (ref 6.5–8.1)

## 2021-06-20 LAB — CBC
HCT: 33.4 % — ABNORMAL LOW (ref 39.0–52.0)
Hemoglobin: 10.8 g/dL — ABNORMAL LOW (ref 13.0–17.0)
MCH: 35.6 pg — ABNORMAL HIGH (ref 26.0–34.0)
MCHC: 32.3 g/dL (ref 30.0–36.0)
MCV: 110.2 fL — ABNORMAL HIGH (ref 80.0–100.0)
Platelets: 260 10*3/uL (ref 150–400)
RBC: 3.03 MIL/uL — ABNORMAL LOW (ref 4.22–5.81)
RDW: 15.6 % — ABNORMAL HIGH (ref 11.5–15.5)
WBC: 9.7 10*3/uL (ref 4.0–10.5)
nRBC: 0 % (ref 0.0–0.2)

## 2021-06-20 LAB — CBC WITH DIFFERENTIAL/PLATELET
Abs Immature Granulocytes: 0.04 10*3/uL (ref 0.00–0.07)
Basophils Absolute: 0 10*3/uL (ref 0.0–0.1)
Basophils Relative: 0 %
Eosinophils Absolute: 0 10*3/uL (ref 0.0–0.5)
Eosinophils Relative: 0 %
HCT: 32.9 % — ABNORMAL LOW (ref 39.0–52.0)
Hemoglobin: 10.7 g/dL — ABNORMAL LOW (ref 13.0–17.0)
Immature Granulocytes: 0 %
Lymphocytes Relative: 8 %
Lymphs Abs: 0.7 10*3/uL (ref 0.7–4.0)
MCH: 35.2 pg — ABNORMAL HIGH (ref 26.0–34.0)
MCHC: 32.5 g/dL (ref 30.0–36.0)
MCV: 108.2 fL — ABNORMAL HIGH (ref 80.0–100.0)
Monocytes Absolute: 1.1 10*3/uL — ABNORMAL HIGH (ref 0.1–1.0)
Monocytes Relative: 12 %
Neutro Abs: 7.4 10*3/uL (ref 1.7–7.7)
Neutrophils Relative %: 80 %
Platelets: 281 10*3/uL (ref 150–400)
RBC: 3.04 MIL/uL — ABNORMAL LOW (ref 4.22–5.81)
RDW: 15.3 % (ref 11.5–15.5)
WBC: 9.3 10*3/uL (ref 4.0–10.5)
nRBC: 0 % (ref 0.0–0.2)

## 2021-06-20 LAB — PROTIME-INR
INR: 1.8 — ABNORMAL HIGH (ref 0.8–1.2)
Prothrombin Time: 21.3 seconds — ABNORMAL HIGH (ref 11.4–15.2)

## 2021-06-20 LAB — CBG MONITORING, ED
Glucose-Capillary: 116 mg/dL — ABNORMAL HIGH (ref 70–99)
Glucose-Capillary: 111 mg/dL — ABNORMAL HIGH (ref 70–99)

## 2021-06-20 LAB — SARS CORONAVIRUS 2 (TAT 6-24 HRS): SARS Coronavirus 2: NEGATIVE

## 2021-06-20 LAB — TSH: TSH: 1.225 u[IU]/mL (ref 0.350–4.500)

## 2021-06-20 LAB — APTT: aPTT: 34 seconds (ref 24–36)

## 2021-06-20 LAB — CREATININE, SERUM
Creatinine, Ser: 9.68 mg/dL — ABNORMAL HIGH (ref 0.61–1.24)
GFR, Estimated: 5 mL/min — ABNORMAL LOW (ref >60)

## 2021-06-20 LAB — LACTIC ACID, PLASMA
Lactic Acid, Venous: 1.8 mmol/L (ref 0.5–1.9)
Lactic Acid, Venous: 1.6 mmol/L (ref 0.5–1.9)

## 2021-06-20 LAB — AMMONIA: Ammonia: 31 umol/L (ref 9–35)

## 2021-06-20 LAB — BRAIN NATRIURETIC PEPTIDE: B Natriuretic Peptide: 4128.9 pg/mL — ABNORMAL HIGH (ref 0.0–100.0)

## 2021-06-20 LAB — PHOSPHORUS: Phosphorus: 8.5 mg/dL — ABNORMAL HIGH (ref 2.5–4.6)

## 2021-06-20 LAB — TROPONIN I (HIGH SENSITIVITY): Troponin I (High Sensitivity): 106 ng/L (ref <18)

## 2021-06-20 MED ORDER — MIDODRINE HCL 5 MG PO TABS: 10.0000 mg | ORAL_TABLET | ORAL | Status: DC

## 2021-06-20 MED ORDER — PROCHLORPERAZINE EDISYLATE 10 MG/2ML IJ SOLN
5.0000 mg | Freq: Four times a day (QID) | INTRAMUSCULAR | Status: DC | PRN
  Administered 2021-06-25: 5 mg via INTRAVENOUS
  Filled 2021-06-20 (×2): qty 1

## 2021-06-20 MED ORDER — PANTOPRAZOLE SODIUM 40 MG PO TBEC: 40.0000 mg | DELAYED_RELEASE_TABLET | Freq: Every day | ORAL | Status: DC

## 2021-06-20 MED ORDER — HEPARIN SODIUM (PORCINE) 5000 UNIT/ML IJ SOLN
5000.0000 [IU] | Freq: Three times a day (TID) | INTRAMUSCULAR | Status: DC
  Administered 2021-06-20: 5000 [IU] via SUBCUTANEOUS
  Filled 2021-06-20: qty 1

## 2021-06-20 MED ORDER — MIDODRINE HCL 5 MG PO TABS
5.0000 mg | ORAL_TABLET | ORAL | Status: DC
  Administered 2021-06-22 – 2021-06-24 (×2): 5 mg via ORAL
  Filled 2021-06-20 (×2): qty 1

## 2021-06-20 MED ORDER — MONTELUKAST SODIUM 10 MG PO TABS
10.0000 mg | ORAL_TABLET | Freq: Every day | ORAL | Status: DC
  Administered 2021-06-22 – 2021-06-24 (×3): 10 mg via ORAL
  Filled 2021-06-20 (×6): qty 1

## 2021-06-20 MED ORDER — OXYCODONE HCL 5 MG PO TABS
5.0000 mg | ORAL_TABLET | Freq: Four times a day (QID) | ORAL | Status: DC | PRN
  Administered 2021-06-23: 5 mg via ORAL
  Filled 2021-06-20 (×2): qty 1

## 2021-06-20 MED ORDER — ACETAMINOPHEN 325 MG PO TABS
650.0000 mg | ORAL_TABLET | Freq: Four times a day (QID) | ORAL | Status: DC | PRN
  Administered 2021-06-26: 650 mg via ORAL
  Filled 2021-06-20: qty 2

## 2021-06-20 MED ORDER — OFLOXACIN 0.3 % OP SOLN
1.0000 [drp] | Freq: Four times a day (QID) | OPHTHALMIC | Status: DC
  Administered 2021-06-22 – 2021-06-27 (×21): 1 [drp] via OPHTHALMIC
  Filled 2021-06-20 (×2): qty 5

## 2021-06-20 MED ORDER — ALLOPURINOL 100 MG PO TABS
100.0000 mg | ORAL_TABLET | ORAL | Status: DC
  Administered 2021-06-23 – 2021-06-25 (×2): 100 mg via ORAL
  Filled 2021-06-20 (×3): qty 1

## 2021-06-20 MED ORDER — BRIMONIDINE TARTRATE 0.15 % OP SOLN
1.0000 [drp] | Freq: Two times a day (BID) | OPHTHALMIC | Status: DC
  Administered 2021-06-22 – 2021-06-27 (×11): 1 [drp] via OPHTHALMIC
  Filled 2021-06-20 (×2): qty 5

## 2021-06-20 MED ORDER — RENA-VITE PO TABS
1.0000 | ORAL_TABLET | Freq: Every evening | ORAL | Status: DC
  Administered 2021-06-22 – 2021-06-26 (×3): 1 via ORAL
  Filled 2021-06-20 (×5): qty 1

## 2021-06-20 MED ORDER — APIXABAN 5 MG PO TABS
5.0000 mg | ORAL_TABLET | Freq: Two times a day (BID) | ORAL | Status: DC
  Filled 2021-06-20: qty 1

## 2021-06-20 MED ORDER — SUCROFERRIC OXYHYDROXIDE 500 MG PO CHEW
500.0000 mg | CHEWABLE_TABLET | Freq: Three times a day (TID) | ORAL | Status: DC
  Administered 2021-06-22 – 2021-06-25 (×7): 500 mg via ORAL
  Filled 2021-06-20 (×17): qty 1

## 2021-06-20 MED ORDER — SODIUM ZIRCONIUM CYCLOSILICATE 10 G PO PACK: 10.0000 g | PACK | Freq: Once | ORAL | Status: DC

## 2021-06-20 MED ORDER — POLYETHYLENE GLYCOL 3350 17 G PO PACK: 17.0000 g | PACK | Freq: Every day | ORAL | Status: DC | PRN

## 2021-06-20 MED ORDER — AMIODARONE HCL 200 MG PO TABS: 200.0000 mg | ORAL_TABLET | Freq: Every day | ORAL | Status: DC

## 2021-06-20 MED ORDER — BICTEGRAVIR-EMTRICITAB-TENOFOV 50-200-25 MG PO TABS
1.0000 | ORAL_TABLET | Freq: Every day | ORAL | Status: DC
  Administered 2021-06-22 – 2021-06-26 (×5): 1 via ORAL
  Filled 2021-06-20 (×7): qty 1

## 2021-06-20 MED ORDER — MIDODRINE HCL 5 MG PO TABS
10.0000 mg | ORAL_TABLET | ORAL | Status: DC
  Administered 2021-06-23 – 2021-06-25 (×3): 10 mg via ORAL
  Filled 2021-06-20 (×3): qty 2

## 2021-06-20 NOTE — H&P (Signed)
History and Physical  [facility] JHE:[id number] DOB: 1941/02/15 DOA: 06/03/2021  Referring physician: Dr. Julieanne Manson, [place] PCP: [name], NP  Outpatient Specialists: Nephrology, infectious disease Patient coming from: Rehab SNF, [facility]  Chief Complaint: Generalized weakness  HPI: [facility] David [name] is a 80 y.o. male with medical history significant for ESRD on HD TTS, paroxysmal A. fib on Eliquis, recent admission for right hip fracture post repair, HIV, gout, chronic hypotension, GERD who presented to [facility] ED from hemodialysis due to generalized weakness and nausea.  Work-up in the ED revealed electrolyte disturbances, elevated BNP greater than 4000, mild pulmonary edema with concern for acute CHF, and elevated troponin with TRH, hospitalist team, was asked to admit.  Unable to obtain a history from the patient due to lethargy.  ED Course:  Temperature 98.3, BP 141/72, pulse 89, respiratory 20, O2 saturation 94% on room air.  Lab studies remarkable for serum sodium 137, potassium 5.4, serum bicarb 27, glucose 114, BUN 56, creatinine 9.26, anion gap 17, phosphorus 8.5, magnesium 2.6, GFR 5.  BNP 4128.  Troponin 106.  Lactic acid 1.8.  Review of Systems: Review of systems as noted in the HPI. All other systems reviewed and are negative.   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   Acute on chronic systolic and diastolic heart failure, NYHA class 4 (HCC)    Anemia, iron deficiency 11/15/2011   Arthritis    "hands, right knee, feet" (02/21/2018)   Atrial fibrillation (HCC)    Cardiac arrest ([place]) 02/17/2018   CHF (congestive heart failure) (HCC)    Chronic combined systolic and diastolic heart failure, NYHA class 3 ([place]) 06/2010   felt to be secondary to hypertensive cardiomyopathy   Chronic lower back pain    CKD (chronic kidney disease) stage V requiring chronic dialysis ([place])    COVID-19 12/2019   ESRD on dialysis [facility])     started 02/2018 [place]   GERD (gastroesophageal reflux disease)    Glaucoma    "I had surgery and dont have it any more"   Gout    daily RX (02/21/2018)   Heart murmur    "mild" per pt   Hepatitis    years ago   HIV (human immunodeficiency virus infection) ([place])    HIV infection ([place])    Hyperlipidemia    Hypertension    Nonischemic cardiomyopathy ([place])    Pneumonia 11/2017   Prostate cancer ([place])    hx of prostate; s/p radioactive seed implant 10/2009 Dr Janice Norrie   Sinus bradycardia    Sleep apnea    does not use a cpap   Stroke [facility])    "mini stroke" years ago   Wears glasses    Past Surgical History:  Procedure Laterality Date   A/V FISTULAGRAM Bilateral 07/30/2020   Procedure: CENTRAL VENOGRAM;  Surgeon: Angelia Mould, MD;  Location: [place] CV LAB;  Service: Cardiovascular;  Laterality: Bilateral;  bilateral upper venogram    AV FISTULA PLACEMENT Right 10/13/2016   Procedure: ARTERIOVENOUS (AV) FISTULA CREATION;  Surgeon: [name], MD;  Location: [place];  Service: Vascular;  Laterality: Right;   AV FISTULA PLACEMENT Left 02/25/2018   Procedure: INSERTION OF ARTERIOVENOUS (AV) GORE-TEX GRAFT LEFT UPPER ARM;  Surgeon: Angelia Mould, MD;  Location: [place];  Service: Vascular;  Laterality: Left;   AV FISTULA PLACEMENT Right 08/07/2018   Procedure: Creation of right arm brachiocephalic Fistula;  Surgeon: Waynetta Sandy,  MD;  Location: [place];  Service: Vascular;  Laterality: Right;   AV FISTULA PLACEMENT Left 11/10/2019   Procedure: INSERTION OF ARTERIOVENOUS (AV) GORE-TEX GRAFT THIGH;  Surgeon: Elam Dutch, MD;  Location: [place];  Service: Vascular;  Laterality: Left;   AV FISTULA PLACEMENT Right 12/02/2019   Procedure: INSERTION OF ARTERIOVENOUS (AV) GORE-TEX GRAFT RIGHT  THIGH;  Surgeon: Waynetta Sandy, MD;  Location: [place];  Service: Vascular;  Laterality: Right;   AV FISTULA PLACEMENT Right 08/13/2020    Procedure: INSERTION OF RIGHT ARTERIOVENOUS (AV) GORE-TEX GRAFT ARM;  Surgeon: Angelia Mould, MD;  Location: [place];  Service: Vascular;  Laterality: Right;   [place] Left 06/24/2015   Procedure: BASILIC VEIN TRANSPOSITION;  Surgeon: Mal Misty, MD;  Location: [place];  Service: Vascular;  Laterality: Left;   BIOPSY  03/14/2019   Procedure: BIOPSY;  Surgeon: Irene Shipper, MD;  Location: [place];  Service: Endoscopy;;   CATARACT EXTRACTION, BILATERAL Bilateral    COLONOSCOPY WITH PROPOFOL N/A 03/14/2019   Procedure: COLONOSCOPY WITH PROPOFOL;  Surgeon: Irene Shipper, MD;  Location: [place];  Service: Endoscopy;  Laterality: N/A;   ESOPHAGOGASTRODUODENOSCOPY (EGD) WITH PROPOFOL N/A 05/05/2014   Procedure: ESOPHAGOGASTRODUODENOSCOPY (EGD) WITH PROPOFOL;  Surgeon: Missy Sabins, MD;  Location: [facility];  Service: Endoscopy;  Laterality: N/A;   EYE SURGERY Bilateral    cataract surgery    GLAUCOMA SURGERY Bilateral    HIP PINNING,CANNULATED Right 06/12/2021   Procedure: CANNULATED RIGHT HIP PINNING;  Surgeon: Jessy Oto, MD;  Location: [place];  Service: Orthopedics;  Laterality: Right;   INSERTION OF ARTERIOVENOUS (AV) ARTEGRAFT ARM  10/09/2018   Procedure: INSERTION OF ARTERIOVENOUS (AV) 51mm x 41cm ARTEGRAFT LEFT UPPER ARM;  Surgeon: Waynetta Sandy, MD;  Location: [place];  Service: Vascular;;   INSERTION OF DIALYSIS CATHETER Right 07/14/2019   Procedure: Insertion Of Dialysis Catheter;  Surgeon: Elam Dutch, MD;  Location: [facility] OR;  Service: Vascular;  Laterality: Right;  placed right Internal Jugular   INSERTION OF DIALYSIS CATHETER Left 08/13/2020   Procedure: INSERTION OF DIALYSIS CATHETER;  Surgeon: Angelia Mould, MD;  Location: [place];  Service: Vascular;  Laterality: Left;   INSERTION PROSTATE RADIATION SEED     IR FLUORO GUIDE CV LINE RIGHT  02/18/2018   IR REMOVAL TUN CV CATH W/O FL  12/06/2018   IR THROMBECTOMY AV FISTULA  W/THROMBOLYSIS INC/SHUNT/IMG RIGHT  11/12/2020   IR THROMBECTOMY AV FISTULA W/THROMBOLYSIS/PTA INC/SHUNT/IMG RIGHT Right 01/26/2020   IR THROMBECTOMY AV FISTULA W/THROMBOLYSIS/PTA INC/SHUNT/IMG RIGHT Right 02/24/2020   IR THROMBECTOMY AV FISTULA W/THROMBOLYSIS/PTA INC/SHUNT/IMG RIGHT Right 04/07/2020   IR US GUIDE VASC ACCESS RIGHT  02/18/2018   IR US GUIDE VASC ACCESS RIGHT  01/26/2020   IR US GUIDE VASC ACCESS RIGHT  04/07/2020   IR US GUIDE VASC ACCESS RIGHT  11/12/2020   LEFT HEART CATH AND CORONARY ANGIOGRAPHY N/A 02/20/2018   Procedure: LEFT HEART CATH AND CORONARY ANGIOGRAPHY;  Surgeon: Jettie Booze, MD;  [facility];;; 50% ostial OM2, 25% mLAD.   LEFT HEART CATH AND CORONARY ANGIOGRAPHY  2004   mildly depressed LV systolic fx EF 76%,EGBTDV coronaries/abdominal aorta/renal arteries.   LOWER EXTREMITY ANGIOGRAM Left 11/17/2019   Procedure: Lower Extremity Fistulogram;  Surgeon: Marty Heck, MD;  Location: [place];  Service: Vascular;  Laterality: Left;   NM MYOCAR PERF WALL MOTION  02/21/2010   No ischemia or infarction.  EF  27%.   RADIOACTIVE SEED IMPLANT  2010   prostate cancer   REMOVAL OF A DIALYSIS CATHETER Right 08/13/2020   Procedure: REMOVAL OF A DIALYSIS CATHETER;  Surgeon: Angelia Mould, MD;  Location: [place];  Service: Vascular;  Laterality: Right;   REVISION OF ARTERIOVENOUS GORETEX GRAFT Right 07/11/2019   Procedure: REVISION OF ARTERIOVENOUS GORETEX GRAFT RIGHT ARM WITH ARTEGRAFT;  Surgeon: Serafina Mitchell, MD;  Location: [place];  Service: Vascular;  Laterality: Right;   SHUNTOGRAM Right 07/14/2019   Procedure: [place] Mal[place];  Surgeon: Elam Dutch, MD;  Location: [place];  Service: Vascular;  Laterality: Right;   THROMBECTOMY AND REVISION OF ARTERIOVENTOUS (AV) GORETEX  GRAFT Right 07/14/2019   Procedure: THROMBECTOMY AND REVISION OF ARTERIOVENTOUS (AV) GORETEX  GRAFT;  Surgeon: Elam Dutch, MD;  Location: [place];  Service: Vascular;   Laterality: Right;   THROMBECTOMY W/ EMBOLECTOMY Left 02/26/2018   Procedure: THROMBECTOMY ARTERIOVENOUS GRAFT;  Surgeon: Angelia Mould, MD;  Location: [place];  Service: Vascular;  Laterality: Left;   THROMBECTOMY W/ EMBOLECTOMY Left 11/17/2019   Procedure: THROMBECTOMY ARTERIOVENOUS GRAFT LEFT THIGH;  Surgeon: Marty Heck, MD;  Location: [place];  Service: Vascular;  Laterality: Left;   TRANSTHORACIC ECHOCARDIOGRAM  02/2012   ([place]) EF 45-50% with mild global HK.  Mild LVH,LA mod. dilated,mild-mod. 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 AI.   TRANSTHORACIC ECHOCARDIOGRAM  9/'18; 3/'19   a) Severely reduced EF.  25 from 30%.  GR 1 DD with diffuse ecchymosis. Mild AS & AI.  Mild LA/RA dilation; b) (post PEA arest):   Moderately dilated LV with moderate concentric virtually.  EF 15 to 20%.  GR 1 DD.  Paradoxical septal motion. Mild AI.  Mild LA dilation.  Poorly visualized RV.   TRANSTHORACIC ECHOCARDIOGRAM  02/2019   EF 20-25%.  Unable to assess diastolic function (A. Fib).  No LV apical thrombus.  Mild AI.  Mildly reduced RV function, however poorly visualized.   ULTRASOUND GUIDANCE FOR VASCULAR ACCESS  02/20/2018   Procedure: Ultrasound Guidance For Vascular Access;  Surgeon: Jettie Booze, MD;  Location: [place] CV LAB;  Service: Cardiovascular;;   UPPER EXTREMITY VENOGRAPHY N/A 07/08/2018   Procedure: UPPER EXTREMITY VENOGRAPHY;  Surgeon: Waynetta Sandy, MD;  Location: [place] CV LAB;  Service: Cardiovascular;  Laterality: N/A;  Bilateral    Social History:  reports that he quit smoking about 16 years ago. His smoking use included cigarettes. He has a 30.00 pack-year smoking history. He has never used smokeless tobacco. He reports current alcohol use of about 1.0 standard drink of alcohol per week. He reports that he does not use drugs.   Allergies  Allergen Reactions   Dextromethorphan-Guaifenesin Other (See Comments)     Unknown reaction   Tocotrienols Other (See Comments)    Unknown reaction   Losartan Potassium Other (See Comments)    Causes constipation    Family History  Problem Relation Age of Onset   Hypertension Mother    Thyroid disease Mother    Cholelithiasis Daughter    Cholelithiasis Son    Hypertension Maternal Grandmother    Diabetes Maternal Grandmother    Heart attack Neg Hx    Hyperlipidemia Neg Hx       Prior to Admission medications   Medication Sig Start Date End Date Taking? Authorizing Provider  acetaminophen (TYLENOL) 500 MG tablet Take 1,000 mg by mouth every 6 (six) hours as needed for moderate pain.  [provider]  allopurinol (ZYLOPRIM) 100 MG tablet TAKE 1 TABLET BY MOUTH 4 TIMES WEEKLY( MONDAY, TUESDAY,THURDSAY AND SATURDAY) AFTER DIALYSIS 10/20/20   Croitoru, Mihai, MD  amiodarone (PACERONE) 200 MG tablet TAKE 1 TABLET(200 MG) BY MOUTH DAILY 10/20/20   Croitoru, Mihai, MD  apixaban (ELIQUIS) 5 MG TABS tablet TAKE 1 TABLET(5 MG) BY MOUTH TWICE DAILY 03/29/21   Croitoru, Mihai, MD  [name] 50-200-25 MG TABS tablet TAKE 1 TABLET BY MOUTH DAILY 04/06/21   Tommy Medal, Lavell Islam, MD  brimonidine (ALPHAGAN P) 0.1 % SOLN Place 1 drop into the right eye 2 (two) times daily.    [provider]  dorzolamide-timolol (COSOPT) 22.3-6.8 MG/ML ophthalmic solution Place 1 drop into the right eye 2 (two) times daily.    [provider]  Doxercalciferol (HECTOROL IV) Dialysis days Monday,Tuesday,Thursday and saturday 04/26/21 04/25/22  [provider]  heparin 1000 unit/mL SOLN injection Heparin Sodium (Porcine) 1,000 Units/mL Catheter Lock Arterial 08/21/20 08/20/21  [provider]  Methoxy PEG-Epoetin Beta (MIRCERA IJ) Dialysis days Monday,Tuesday,Thursday and saturday 12/16/20 04/20/22  [provider]  midodrine (PROAMATINE) 10 MG tablet Take 1 tablet (10 mg total) by mouth as directed. Twice a day on the day of HD Tuesday, Thursday,  Saturday (1 in AM and 1 at [place]). Take 1/2 tablet (5mg ) a day on non HD days 04/22/19   Leonie Man, MD  montelukast (SINGULAIR) 10 MG tablet TAKE 1 TABLET(10 MG) BY MOUTH AT BEDTIME 05/06/21   [name], NP  multivitamin (RENA-VIT) TABS tablet Take 1 tablet by mouth every evening. 05/26/20   [provider]  ofloxacin (OCUFLOX) 0.3 % ophthalmic solution Place 1 drop into the right eye 4 (four) times daily. 02/22/21   [name], NP  oxyCODONE (OXY IR/ROXICODONE) 5 MG immediate release tablet Take 1 tablet (5 mg total) by mouth every 6 (six) hours as needed for moderate pain or breakthrough pain. 06/15/21   Charlynne Cousins, MD  pantoprazole (PROTONIX) 40 MG tablet TAKE 1 TABLET(40 MG) BY MOUTH DAILY 05/17/21   [name], NP  sucroferric oxyhydroxide (VELPHORO) 500 MG chewable tablet Chew 500 mg by mouth 3 (three) times daily as needed (with meals).    [provider]    Physical Exam: BP 138/72   Pulse 87   Temp 98.3 F (36.8 C) (Oral)   Resp (!) 23   Ht 6\' 2"  (1.88 m)   Wt 80.6 kg   SpO2 96%   BMI 22.81 kg/m   General: 80 y.o. year-old male well developed well nourished in no acute distress.  Lethargic. Cardiovascular: Regular rate and rhythm with no rubs or gallops.  No thyromegaly or JVD noted.  No lower extremity edema. 2/4 pulses in all 4 extremities. Respiratory: Clear to auscultation with no wheezes or rales. Good inspiratory effort. Abdomen: Soft nontender nondistended with normal bowel sounds x4 quadrants. Muskuloskeletal: No cyanosis, clubbing or edema noted bilaterally Neuro: CN II-XII intact, strength, sensation, reflexes Skin: No ulcerative lesions noted or rashes Psychiatry: Judgement and insight appear altered.  Unable to assess mood due to lethargy.         Labs on Admission:  Basic Metabolic Panel: Recent Labs  Lab 06/14/21 0035 06/15/21 0156 06/16/21 0747 06/12/2021 1700  NA 134* 136 135 137  K 4.9 4.3 4.3 5.4*   CL 95* 96* 96* 93*  CO2 25 25 24 27   GLUCOSE 122* 87 127* 114*  BUN 69* 41* 60* 56*  CREATININE 9.22* 6.84*  9.50* 9.26*  CALCIUM 9.0 8.6* 8.9 8.9  MG  --   --   --  2.6*  PHOS  --   --  6.9* 8.5*   Liver Function Tests: Recent Labs  Lab 06/16/21 0747 07/02/2021 1700  AST  --  29  ALT  --  7  ALKPHOS  --  123  BILITOT  --  1.1  PROT  --  7.0  ALBUMIN 3.3* 3.2*   No results for input(s): LIPASE, AMYLASE in the last 168 hours. Recent Labs  Lab 06/23/2021 1700  AMMONIA 31   CBC: Recent Labs  Lab 06/14/21 0035 06/15/21 0156 06/16/21 0747 06/21/2021 1700  WBC 9.0 6.7 6.1 9.3  NEUTROABS  --   --   --  7.4  HGB 10.6* 9.6* 10.4* 10.7*  HCT 32.5* 29.6* 31.8* 32.9*  MCV 107.6* 108.8* 107.8* 108.2*  PLT 168 162 189 281   Cardiac Enzymes: No results for input(s): CKTOTAL, CKMB, CKMBINDEX, TROPONINI in the last 168 hours.  BNP (last 3 results) Recent Labs    11/30/20 1430 07/02/2021 1700  BNP 2,788.5* 4,128.9*    ProBNP (last 3 results) No results for input(s): PROBNP in the last 8760 hours.  CBG: Recent Labs  Lab 07/01/2021 1529  GLUCAP 116*    Radiological Exams on Admission: CT Head Wo Contrast  Result Date: 06/23/2021 CLINICAL DATA:  Altered mental status EXAM: CT HEAD WITHOUT CONTRAST TECHNIQUE: Contiguous axial images were obtained from the base of the skull through the vertex without intravenous contrast. COMPARISON:  09/05/2019 FINDINGS: Brain: No evidence of acute infarction, hemorrhage, hydrocephalus, extra-axial collection or mass lesion/mass effect. Mild-moderate low-density changes within the periventricular and subcortical white matter compatible with chronic microvascular ischemic change. Mild diffuse cerebral volume loss with stable size and configuration of the ventricles. Vascular: No hyperdense vessel or unexpected calcification. Skull: Normal. Negative for fracture or focal lesion. Sinuses/Orbits: No acute finding. Other: None. IMPRESSION: 1. No acute  intracranial findings. 2. Chronic microvascular ischemic change and cerebral volume loss. Electronically Signed   By: Davina Poke D.O.   On: 06/13/2021 17:04   DG Chest Portable 1 View  Result Date: 06/25/2021 CLINICAL DATA:  Recent right hip fracture, lethargy, nausea EXAM: PORTABLE CHEST 1 VIEW COMPARISON:  06/10/2021 FINDINGS: Single frontal view of the chest demonstrates an enlarged cardiac silhouette. There is chronic central vascular congestion without airspace disease, effusion, or pneumothorax. No acute bony abnormalities. IMPRESSION: 1. Stable enlarged cardiac silhouette and vascular congestion. No acute airspace disease. Electronically Signed   By: Randa Ngo M.D.   On: 06/06/2021 16:37    EKG: I independently viewed the EKG done and my findings are as followed: Sinus rhythm rate of 82, nonspecific ST-T changes.  QTc 500.  Assessment/Plan Present on Admission: **None**  Active Problems:   Generalized weakness  Generalized weakness post hemodialysis Resume diet, encourage oral intake, manage electrolytes, monitor Nephrology has been consulted to resume hemodialysis in the hospital PT OT to evaluate Fall precautions  ESRD on HD TTS Hemodialysis on 06/08/2021 Management per nephrology  Paroxysmal A. fib on Eliquis Resume home amiodarone Resume home Eliquis for CVA prevention Monitor on telemetry  Hyperkalemia Serum potassium 5.4 Lokelma 10 g x 1 Repeat renal panel  HIV Resume HAART medications Obtain viral load HIV RNA, CD4 count.  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  Code Status: Full code  Family Communication: None at bedside  Disposition Plan: Admit to telemetry cardiac  Consults called: Nephrology  Admission status: Observation status   Status is: Observation   Dispo:  Patient From: [place]  Planned Disposition: [employer], possibly on 06/21/2021 once symptomatology has improved.  Medically stable for discharge: No      Kayleen Memos MD [facility] Pager [phone]  If 7PM-7AM, please contact night-coverage [email or web address] Password [facility]  06/22/2021, 8:33 PM

## 2021-06-20 NOTE — ED Triage Notes (Signed)
Pt from [facility] where he has been since last week after a right hip fx after falling at home Pt is there for rehab.  Pt had dialysis today and had some nausea after.  Staff stated he seemed more lethargic as well.  Pt is sleeping at time of triage but does answer questions appropriately when roused.

## 2021-06-20 NOTE — ED Notes (Signed)
Wife Rawlin Reaume [phone] would like an update

## 2021-06-20 NOTE — ED Provider Notes (Signed)
[facility] EMERGENCY DEPARTMENT Provider Note   CSN: [id number] Arrival date & time: 06/29/2021  1526     History Chief Complaint  Patient presents with   Nausea    [facility] [place] is a 80 y.o. male.  HPI  48yoM PMHx TIA, nonischemic cardiomyopathy with CHF (NYHA 4), atrial fibrillation (on Eliquis), ESRD (HD TTS), HTN, HLD, CAD, AIDS, late stage syphilis, brought from [facility] (discharged 4 days ago after admission for right hip fracture) for nausea after dialysis session today.  Staff additionally concerned that he has appeared lethargic.  Patient states he has been feeling generally weak since yesterday, has had 1 episode of NBNB vomiting.  States he is concerned he has COVID, although is unable to describe whether he had any exposures.  Diffusely weak, the point where is difficult for him to converse.  He is additionally noted to have edema in his extremities  No further medical concerns to include fevers, chills, diaphoresis, sore throat, rhinorrhea, shortness of breath, cough, chest pain, palpitations, abdominal pain, bowel/bladder changes, focal paresthesias/weakness, headache.  History obtained from patient and chart review.     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   Acute on chronic systolic and diastolic heart failure, NYHA class 4 (HCC)    Anemia, iron deficiency 11/15/2011   Arthritis    "hands, right knee, feet" (02/21/2018)   Atrial fibrillation (HCC)    Cardiac arrest ([place]) 02/17/2018   CHF (congestive heart failure) (HCC)    Chronic combined systolic and diastolic heart failure, NYHA class 3 ([place]) 06/2010   felt to be secondary to hypertensive cardiomyopathy   Chronic lower back pain    CKD (chronic kidney disease) stage V requiring chronic dialysis ([place])    COVID-19 12/2019   ESRD on dialysis ([place])    started 02/2018 [place]   GERD (gastroesophageal reflux  disease)    Glaucoma    "I had surgery and dont have it any more"   Gout    daily RX (02/21/2018)   Heart murmur    "mild" per pt   Hepatitis    years ago   HIV (human immunodeficiency virus infection) ([place])    HIV infection ([place])    Hyperlipidemia    Hypertension    Nonischemic cardiomyopathy ([place])    Pneumonia 11/2017   Prostate cancer ([place])    hx of prostate; s/p radioactive seed implant 10/2009 Dr Janice Norrie   Sinus bradycardia    Sleep apnea    does not use a cpap   Stroke ([place])    "mini stroke" years ago   Wears glasses     Patient Active Problem List   Diagnosis Date Noted   Generalized weakness 06/26/2021   Subcapital fracture of hip, right, closed, initial encounter ([place]) 06/14/2021    Class: Acute   S/P right hip fracture 06/10/2021   Gait disorder 78/29/5621   Complication of vascular dialysis catheter 09/29/2020   Disease of gallbladder, unspecified 05/18/2020   Elevated LFTs    Abdominal pain    Transaminitis 05/15/2020   Full code status 02/03/2020   Subtherapeutic international normalized ratio (INR) 01/24/2020   Pressure injury of skin 01/07/2020   Combined systolic and diastolic heart failure ([place])    COVID-19 12/11/2019   Acute respiratory failure with hypoxemia ([place]) 11/01/2019   Closed fracture of nasal bones 09/10/2019   Hyperkalemia 09/02/2019   Shortness of breath 04/12/2019   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  Dependence on renal dialysis ([place]) 03/27/2019   Hypertensive heart and chronic kidney disease with heart failure and with stage 5 chronic kidney disease, or end stage renal disease ([place]) 03/27/2019   Mesenteric ischemia (HCC)    Colonic ulcer    Right lower quadrant pain    Abnormal CT of the abdomen    Ischemic colitis ([place])    Colitis presumed infectious 03/09/2019   Acute respiratory failure with hypoxia and hypercapnia (HCC) 03/04/2019   Pruritus, unspecified 02/21/2019   Acute pulmonary edema ([place]) 02/11/2019   Pain, unspecified  01/21/2019   Paroxysmal atrial fibrillation ([place]) 11/16/2018   LBBB (left bundle branch block) 11/16/2018   Pulmonary edema 10/21/2018   Gout 10/21/2018   Hypertensive emergency 10/21/2018   Chronic combined systolic and diastolic heart failure, NYHA class 3 ([place]) 10/21/2018   Leukocytosis 10/21/2018   Gastroesophageal reflux disease 08/28/2018   CAD (coronary artery disease) 04/03/2018   Palliative care encounter 03/27/2018   Atrial fibrillation, chronic ([place]) 03/25/2018   Anemia in chronic kidney disease 03/12/2018   Coagulation defect, unspecified ([place]) 03/12/2018   Secondary hyperparathyroidism of renal origin ([place]) 03/12/2018   Palliative care by specialist    Advance care planning    Goals of care, counseling/discussion    Respiratory arrest ([place])    Acute on chronic systolic heart failure ([place])    Chronic obstructive pulmonary disease ([place])    HIV (human immunodeficiency virus infection) ([place])    Pneumonia    Aspiration pneumonia of both lower lobes due to regurgitated food ([place])    Sepsis due to pneumonia ([place]) 11/22/2017   AIDS ([place]) 05/14/2014   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   Gastroparesis 05/06/2014   Protein-calorie malnutrition, severe ([place]) 05/05/2014   ESRD on hemodialysis ([place]) 05/02/2014   Hemodialysis-associated hypotension 05/02/2014   Leaking of conjunctival drainage bleb 05/01/2014   POAG (primary open-angle glaucoma) 05/01/2014   Loss of weight 04/29/2014   Conjunctivitis 04/29/2014   Nonischemic dilated cardiomyopathy ([place]) 09/10/2013   Low back pain 04/09/2012   Osteoarthritis of hip 12/29/2011   Iron deficiency anemia 06/28/2011   Macrocytic anemia 04/02/2011   ADENOCARCINOMA, PROSTATE, 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   Hyperlipidemia 11/30/2010   Transient cerebral ischemia 11/30/2010   HYPERGLYCEMIA 11/30/2010    Past Surgical History:  Procedure Laterality Date   A/V FISTULAGRAM Bilateral 07/30/2020   Procedure: CENTRAL VENOGRAM;  Surgeon: Angelia Mould, MD;  Location: [place] [facility];  Service: Cardiovascular;  Laterality: Bilateral;  bilateral upper venogram    AV FISTULA PLACEMENT Right 10/13/2016   Procedure: ARTERIOVENOUS (AV) FISTULA CREATION;  Surgeon: [name], MD;  Location: [place];  Service: Vascular;  Laterality: Right;   AV FISTULA PLACEMENT Left 02/25/2018   Procedure: INSERTION OF ARTERIOVENOUS (AV) GORE-TEX GRAFT LEFT UPPER ARM;  Surgeon: Angelia Mould, MD;  Location: [place];  Service: Vascular;  Laterality: Left;   AV FISTULA PLACEMENT Right 08/07/2018   Procedure: Creation of right arm brachiocephalic Fistula;  Surgeon: Waynetta Sandy, MD;  Location: [place];  Service: Vascular;  Laterality: Right;   AV FISTULA PLACEMENT Left 11/10/2019   Procedure: INSERTION OF ARTERIOVENOUS (AV) GORE-TEX GRAFT THIGH;  Surgeon: Elam Dutch, MD;  Location: [place];  Service: Vascular;  Laterality: Left;   AV FISTULA PLACEMENT Right 12/02/2019   Procedure: INSERTION OF ARTERIOVENOUS (AV) GORE-TEX GRAFT RIGHT  THIGH;  Surgeon: Waynetta Sandy, MD;  Location: [place];  Service: Vascular;  Laterality: Right;   AV FISTULA PLACEMENT Right  08/13/2020   Procedure: INSERTION OF RIGHT ARTERIOVENOUS (AV) GORE-TEX GRAFT ARM;  Surgeon: Angelia Mould, MD;  Location: [place];  Service: Vascular;  Laterality: Right;   [place] Left 06/24/2015   Procedure: BASILIC VEIN TRANSPOSITION;  Surgeon: Mal Misty, MD;  Location: [place];  Service: Vascular;  Laterality: Left;   BIOPSY  03/14/2019   Procedure: BIOPSY;  Surgeon: Irene Shipper, MD;  Location: [facility] ENDOSCOPY;  Service: Endoscopy;;   CATARACT EXTRACTION, BILATERAL Bilateral    COLONOSCOPY WITH PROPOFOL N/A 03/14/2019   Procedure: COLONOSCOPY WITH PROPOFOL;  Surgeon: Irene Shipper, MD;  Location: [place];  Service: Endoscopy;  Laterality: N/A;   ESOPHAGOGASTRODUODENOSCOPY (EGD) WITH PROPOFOL N/A 05/05/2014   Procedure:  ESOPHAGOGASTRODUODENOSCOPY (EGD) WITH PROPOFOL;  Surgeon: Missy Sabins, MD;  Location: [facility]OS[facility];  Service: Endoscopy;  Laterality: N/A;   EYE SURGERY Bilateral    cataract surgery    GLAUCOMA SURGERY Bilateral    HIP PINNING,CANNULATED Right 06/12/2021   Procedure: CANNULATED RIGHT HIP PINNING;  Surgeon: Jessy Oto, MD;  Location: [place];  Service: Orthopedics;  Laterality: Right;   INSERTION OF ARTERIOVENOUS (AV) ARTEGRAFT ARM  10/09/2018   Procedure: INSERTION OF ARTERIOVENOUS (AV) 46mm x 41cm ARTEGRAFT LEFT UPPER ARM;  Surgeon: Waynetta Sandy, MD;  Location: [place];  Service: Vascular;;   INSERTION OF DIALYSIS CATHETER Right 07/14/2019   Procedure: Insertion Of Dialysis Catheter;  Surgeon: Elam Dutch, MD;  Location: [facility] OR;  Service: Vascular;  Laterality: Right;  placed right Internal Jugular   INSERTION OF DIALYSIS CATHETER Left 08/13/2020   Procedure: INSERTION OF DIALYSIS CATHETER;  Surgeon: Angelia Mould, MD;  Location: [place];  Service: Vascular;  Laterality: Left;   INSERTION PROSTATE RADIATION SEED     IR FLUORO GUIDE CV LINE RIGHT  02/18/2018   IR REMOVAL TUN CV CATH W/O FL  12/06/2018   IR THROMBECTOMY AV FISTULA W/THROMBOLYSIS INC/SHUNT/IMG RIGHT  11/12/2020   IR THROMBECTOMY AV FISTULA W/THROMBOLYSIS/PTA INC/SHUNT/IMG RIGHT Right 01/26/2020   IR THROMBECTOMY AV FISTULA W/THROMBOLYSIS/PTA INC/SHUNT/IMG RIGHT Right 02/24/2020   IR THROMBECTOMY AV FISTULA W/THROMBOLYSIS/PTA INC/SHUNT/IMG RIGHT Right 04/07/2020   IR US GUIDE VASC ACCESS RIGHT  02/18/2018   IR US GUIDE VASC ACCESS RIGHT  01/26/2020   IR US GUIDE VASC ACCESS RIGHT  04/07/2020   IR US GUIDE VASC ACCESS RIGHT  11/12/2020   LEFT HEART CATH AND CORONARY ANGIOGRAPHY N/A 02/20/2018   Procedure: LEFT HEART CATH AND CORONARY ANGIOGRAPHY;  Surgeon: Jettie Booze, MD;  [facility];;; 50% ostial OM2, 25% mLAD.   LEFT HEART CATH AND CORONARY ANGIOGRAPHY  2004   mildly depressed LV systolic  fx EF 76%,HYWVPX coronaries/abdominal aorta/renal arteries.   LOWER EXTREMITY ANGIOGRAM Left 11/17/2019   Procedure: Lower Extremity Fistulogram;  Surgeon: Marty Heck, MD;  Location: [place];  Service: Vascular;  Laterality: Left;   NM MYOCAR PERF WALL MOTION  02/21/2010   No ischemia or infarction.  EF 27%.   RADIOACTIVE SEED IMPLANT  2010   prostate cancer   REMOVAL OF A DIALYSIS CATHETER Right 08/13/2020   Procedure: REMOVAL OF A DIALYSIS CATHETER;  Surgeon: Angelia Mould, MD;  Location: [facility] OR;  Service: Vascular;  Laterality: Right;   REVISION OF ARTERIOVENOUS GORETEX GRAFT Right 07/11/2019   Procedure: REVISION OF ARTERIOVENOUS GORETEX GRAFT RIGHT ARM WITH ARTEGRAFT;  Surgeon: Serafina Mitchell, MD;  Location: [place];  Service: Vascular;  Laterality: Right;   SHUNTOGRAM  Right 07/14/2019   Procedure: Shuntogram;  Surgeon: Elam Dutch, MD;  Location: [place];  Service: Vascular;  Laterality: Right;   THROMBECTOMY AND REVISION OF ARTERIOVENTOUS (AV) GORETEX  GRAFT Right 07/14/2019   Procedure: THROMBECTOMY AND REVISION OF ARTERIOVENTOUS (AV) GORETEX  GRAFT;  Surgeon: Elam Dutch, MD;  Location: [place];  Service: Vascular;  Laterality: Right;   THROMBECTOMY W/ EMBOLECTOMY Left 02/26/2018   Procedure: THROMBECTOMY ARTERIOVENOUS GRAFT;  Surgeon: Angelia Mould, MD;  Location: [place];  Service: Vascular;  Laterality: Left;   THROMBECTOMY W/ EMBOLECTOMY Left 11/17/2019   Procedure: THROMBECTOMY ARTERIOVENOUS GRAFT LEFT THIGH;  Surgeon: Marty Heck, MD;  Location: [employer];  Service: Vascular;  Laterality: Left;   TRANSTHORACIC ECHOCARDIOGRAM  02/2012   ([place]) EF 45-50% with mild global HK.  Mild LVH,LA mod. dilated,mild-mod. 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 AI.   TRANSTHORACIC ECHOCARDIOGRAM  9/'18; 3/'19   a) Severely reduced EF.  25 from 30%.  GR 1 DD with diffuse ecchymosis. Mild AS & AI.  Mild LA/RA dilation; b) (post PEA arest):    Moderately dilated LV with moderate concentric virtually.  EF 15 to 20%.  GR 1 DD.  Paradoxical septal motion. Mild AI.  Mild LA dilation.  Poorly visualized RV.   TRANSTHORACIC ECHOCARDIOGRAM  02/2019   EF 20-25%.  Unable to assess diastolic function (A. Fib).  No LV apical thrombus.  Mild AI.  Mildly reduced RV function, however poorly visualized.   ULTRASOUND GUIDANCE FOR VASCULAR ACCESS  02/20/2018   Procedure: Ultrasound Guidance For Vascular Access;  Surgeon: Jettie Booze, MD;  Location: [place] [facility];  Service: Cardiovascular;;   UPPER EXTREMITY VENOGRAPHY N/A 07/08/2018   Procedure: UPPER EXTREMITY VENOGRAPHY;  Surgeon: Waynetta Sandy, MD;  Location: [place] [facility];  Service: Cardiovascular;  Laterality: N/A;  Bilateral       Family History  Problem Relation Age of Onset   Hypertension Mother    Thyroid disease Mother    Cholelithiasis Daughter    Cholelithiasis Son    Hypertension Maternal Grandmother    Diabetes Maternal Grandmother    Heart attack Neg Hx    Hyperlipidemia Neg Hx     Social History   Tobacco Use   Smoking status: Former    Packs/day: 1.00    Years: 30.00    Pack years: 30.00    Types: Cigarettes    Quit date: 2006    Years since quitting: 16.5   Smokeless tobacco: Never  Vaping Use   Vaping Use: Never used  Substance Use Topics   Alcohol use: Yes    Alcohol/week: 1.0 standard drink    Types: 1 Shots of liquor per week    Comment: occasional   Drug use: Never    Home Medications Prior to Admission medications   Medication Sig Start Date End Date Taking? Authorizing Provider  acetaminophen (TYLENOL) 500 MG tablet Take 1,000 mg by mouth every 6 (six) hours as needed for moderate pain.   Yes [provider]  allopurinol (ZYLOPRIM) 100 MG tablet TAKE 1 TABLET BY MOUTH 4 TIMES WEEKLY( MONDAY, [place]) AFTER DIALYSIS Patient taking differently: Take 100 mg by mouth See admin instructions.  TAKE 1 TABLET BY MOUTH 4 TIMES WEEKLY( MONDAY, TUESDAY,THURDSAY AND SATURDAY) AFTER DIALYSIS 10/20/20  Yes Croitoru, Mihai, MD  amiodarone (PACERONE) 200 MG tablet TAKE 1 TABLET(200 MG) BY MOUTH DAILY Patient taking differently: Take 200 mg by mouth daily.  10/20/20  Yes Croitoru, Mihai, MD  apixaban (ELIQUIS) 5 MG TABS tablet TAKE 1 TABLET(5 MG) BY MOUTH TWICE DAILY Patient taking differently: Take 5 mg by mouth 2 (two) times daily. 03/29/21  Yes Croitoru, Mihai, MD  [name] 50-200-25 MG TABS tablet TAKE 1 TABLET BY MOUTH DAILY 04/06/21  Yes Tommy Medal, Lavell Islam, MD  brimonidine (ALPHAGAN P) 0.1 % SOLN Place 1 drop into the right eye 2 (two) times daily.   Yes [provider]  dorzolamide-timolol (COSOPT) 22.3-6.8 MG/ML ophthalmic solution Place 1 drop into the right eye 2 (two) times daily.   Yes [provider]  Doxercalciferol (HECTOROL IV) Dialysis days Monday,Tuesday,Thursday and saturday 04/26/21 04/25/22 Yes [provider]  heparin 1000 unit/mL SOLN injection Heparin Sodium (Porcine) 1,000 Units/mL Catheter Lock Arterial 08/21/20 08/20/21 Yes [provider]  Methoxy PEG-Epoetin Beta (MIRCERA IJ) Dialysis days Monday,Tuesday,Thursday and saturday 12/16/20 04/20/22 Yes [provider]  midodrine (PROAMATINE) 10 MG tablet Take 1 tablet (10 mg total) by mouth as directed. Twice a day on the day of HD Tuesday, Thursday, Saturday (1 in AM and 1 at [facility]). Take 1/2 tablet (5mg ) a day on non HD days 04/22/19  Yes Leonie Man, MD  montelukast (SINGULAIR) 10 MG tablet TAKE 1 TABLET(10 MG) BY MOUTH AT BEDTIME Patient taking differently: Take 10 mg by mouth at bedtime. 05/06/21  Yes [name], NP  multivitamin (RENA-VIT) TABS tablet Take 1 tablet by mouth every evening. 05/26/20  Yes [provider]  ofloxacin (OCUFLOX) 0.3 % ophthalmic solution Place 1 drop into the right eye 4 (four) times daily. 02/22/21  Yes [name], NP  oxyCODONE  (OXY IR/ROXICODONE) 5 MG immediate release tablet Take 1 tablet (5 mg total) by mouth every 6 (six) hours as needed for moderate pain or breakthrough pain. 06/15/21  Yes Charlynne Cousins, MD  pantoprazole (PROTONIX) 40 MG tablet TAKE 1 TABLET(40 MG) BY MOUTH DAILY Patient taking differently: Take 40 mg by mouth daily. 05/17/21  Yes [name], NP  sucroferric oxyhydroxide (VELPHORO) 500 MG chewable tablet Chew 500 mg by mouth 3 (three) times daily as needed (with meals).   Yes [provider]    Allergies    Dextromethorphan-guaifenesin, Tocotrienols, and Losartan potassium  Review of Systems   Review of Systems  All other systems reviewed and are negative.  Physical Exam Updated Vital Signs BP 128/69 (BP Location: Left Arm)   Pulse 96   Temp 98.3 F (36.8 C) (Oral)   Resp 19   Ht 6\' 2"  (1.88 m)   Wt 80.6 kg   SpO2 96%   BMI 22.81 kg/m   Physical Exam Vitals and nursing note reviewed.  Constitutional:      Appearance: He is not diaphoretic.     Comments: Lethargic, weak appearing  HENT:     Head: Normocephalic and atraumatic.     Nose: Nose normal.     Mouth/Throat:     Mouth: Mucous membranes are moist.     Pharynx: Oropharynx is clear.  Eyes:     Conjunctiva/sclera: Conjunctivae normal.     Comments: EOM grossly intact, bilateral pupils appear slightly medially deviated, but are round and reactive  Cardiovascular:     Rate and Rhythm: Normal rate and regular rhythm.     Heart sounds: Murmur heard.    No friction rub. No gallop.  Pulmonary:     Effort: No respiratory distress.     Breath sounds: No stridor. No wheezing, rhonchi or  rales.     Comments: Diminished in bases, shallow breaths but not tachypneic Abdominal:     General: There is no distension.     Palpations: Abdomen is soft.     Tenderness: There is no abdominal tenderness. There is no guarding or rebound.  Musculoskeletal:     Cervical back: No tenderness.     Right lower leg:  Edema present.     Left lower leg: Edema present.     Comments: Right AVF.  All 4 extremities edematous  Lymphadenopathy:     Cervical: No cervical adenopathy.  Skin:    General: Skin is dry.     Coloration: Skin is not jaundiced.     Comments: Skin is cool to touch  Neurological:     Mental Status: He is alert.     Comments: Mental status: Oriented to place, difficulty recalling year Speech: Whispered, no dysarthria CN II: visual fields grossly intact CN III/IV/VI: Pupils medially deviated bilaterally, but round and reactive, EOMI CN V: facial sensation to LT intact CN VII: no facial droop CN VIII: no nystagmus, audition grossly intact VN IX/X: Tolerating secretions CN XI: trapezius and SCM motor function intact CN XII: Midline tongue, questionable fasciculation, no atrophy All 4 extremities are weak Coordination: Baseline RUE tremors, not tested further Gait: Not tested    ED Results / Procedures / Treatments   Labs (all labs ordered are listed, but only abnormal results are displayed) Labs Reviewed  CBC WITH DIFFERENTIAL/PLATELET - Abnormal; Notable for the following components:      Result Value   RBC 3.04 (*)    Hemoglobin 10.7 (*)    HCT 32.9 (*)    MCV 108.2 (*)    MCH 35.2 (*)    Monocytes Absolute 1.1 (*)    All other components within normal limits  COMPREHENSIVE METABOLIC PANEL - Abnormal; Notable for the following components:   Potassium 5.4 (*)    Chloride 93 (*)    Glucose, Bld 114 (*)    BUN 56 (*)    Creatinine, Ser 9.26 (*)    Albumin 3.2 (*)    GFR, Estimated 5 (*)    Anion gap 17 (*)    All other components within normal limits  BRAIN NATRIURETIC PEPTIDE - Abnormal; Notable for the following components:   B Natriuretic Peptide 4,128.9 (*)    All other components within normal limits  MAGNESIUM - Abnormal; Notable for the following components:   Magnesium 2.6 (*)    All other components within normal limits  PHOSPHORUS - Abnormal; Notable for  the following components:   Phosphorus 8.5 (*)    All other components within normal limits  PROTIME-INR - Abnormal; Notable for the following components:   Prothrombin Time 21.3 (*)    INR 1.8 (*)    All other components within normal limits  CBC - Abnormal; Notable for the following components:   RBC 3.03 (*)    Hemoglobin 10.8 (*)    HCT 33.4 (*)    MCV 110.2 (*)    MCH 35.6 (*)    RDW 15.6 (*)    All other components within normal limits  CREATININE, SERUM - Abnormal; Notable for the following components:   Creatinine, Ser 9.68 (*)    GFR, Estimated 5 (*)    All other components within normal limits  CBG MONITORING, ED - Abnormal; Notable for the following components:   Glucose-Capillary 116 (*)    All other components within normal limits  CBG MONITORING, ED - Abnormal; Notable for the following components:   Glucose-Capillary 111 (*)    All other components within normal limits  TROPONIN I (HIGH SENSITIVITY) - Abnormal; Notable for the following components:   Troponin I (High Sensitivity) 106 (*)    All other components within normal limits  SARS CORONAVIRUS 2 (TAT 6-24 HRS)  CULTURE, BLOOD (ROUTINE X 2)  CULTURE, BLOOD (ROUTINE X 2)  AMMONIA  TSH  LACTIC ACID, PLASMA  LACTIC ACID, PLASMA  APTT  HIV-1 RNA QUANT-NO REFLEX-BLD  T-HELPER CELLS (CD4) COUNT (NOT AT [facility])    EKG EKG Interpretation  Date/Time:  Monday June 20 2021 17:06:02 EDT Ventricular Rate:  82 PR Interval:  211 QRS Duration: 142 QT Interval:  428 QTC Calculation: 500 R Axis:   -61 Text Interpretation: Sinus rhythm Probable left atrial enlargement Left bundle branch block Confirmed by Elnora Morrison [phone]) on 06/15/2021 6:28:44 PM  Radiology CT Head Wo Contrast  Result Date: 06/07/2021 CLINICAL DATA:  Altered mental status EXAM: CT HEAD WITHOUT CONTRAST TECHNIQUE: Contiguous axial images were obtained from the base of the skull through the vertex without intravenous contrast. COMPARISON:   09/05/2019 FINDINGS: Brain: No evidence of acute infarction, hemorrhage, hydrocephalus, extra-axial collection or mass lesion/mass effect. Mild-moderate low-density changes within the periventricular and subcortical white matter compatible with chronic microvascular ischemic change. Mild diffuse cerebral volume loss with stable size and configuration of the ventricles. Vascular: No hyperdense vessel or unexpected calcification. Skull: Normal. Negative for fracture or focal lesion. Sinuses/Orbits: No acute finding. Other: None. IMPRESSION: 1. No acute intracranial findings. 2. Chronic microvascular ischemic change and cerebral volume loss. Electronically Signed   By: Davina Poke D.O.   On: 06/04/2021 17:04   MR Brain Wo Contrast (neuro protocol)  Result Date: 06/03/2021 CLINICAL DATA:  Encephalopathy EXAM: MRI HEAD WITHOUT CONTRAST TECHNIQUE: Multiplanar, multiecho pulse sequences of the brain and surrounding structures were obtained without intravenous contrast. COMPARISON:  01/07/2019 FINDINGS: Brain: No acute infarct, mass effect or extra-axial collection. No acute or chronic hemorrhage. Hyperintense T2-weighted signal is moderately widespread throughout the white matter. Generalized volume loss without a clear lobar predilection. The midline structures are normal. Vascular: Major flow voids are preserved. Skull and upper cervical spine: Normal calvarium and skull base. Visualized upper cervical spine and soft tissues are normal. Sinuses/Orbits:No paranasal sinus fluid levels or advanced mucosal thickening. No mastoid or middle ear effusion. Normal orbits. IMPRESSION: 1. No acute intracranial abnormality. 2. Generalized volume loss and findings of chronic microvascular ischemia. Electronically Signed   By: Ulyses Jarred M.D.   On: 06/27/2021 23:20   DG Chest Portable 1 View  Result Date: 06/15/2021 CLINICAL DATA:  Recent right hip fracture, lethargy, nausea EXAM: PORTABLE CHEST 1 VIEW COMPARISON:   06/10/2021 FINDINGS: Single frontal view of the chest demonstrates an enlarged cardiac silhouette. There is chronic central vascular congestion without airspace disease, effusion, or pneumothorax. No acute bony abnormalities. IMPRESSION: 1. Stable enlarged cardiac silhouette and vascular congestion. No acute airspace disease. Electronically Signed   By: Randa Ngo M.D.   On: 06/19/2021 16:37    Procedures Procedures   Medications Ordered in ED Medications  prochlorperazine (COMPAZINE) injection 5 mg (has no administration in time range)  acetaminophen (TYLENOL) tablet 650 mg (has no administration in time range)  polyethylene glycol (MIRALAX / GLYCOLAX) packet 17 g (has no administration in time range)  sodium zirconium cyclosilicate (LOKELMA) packet 10 g (10 g Oral Not Given 06/16/2021 2225)  allopurinol (ZYLOPRIM) tablet 100  mg (has no administration in time range)  apixaban (ELIQUIS) tablet 5 mg (5 mg Oral Not Given 07/03/2021 2225)  amiodarone (PACERONE) tablet 200 mg (has no administration in time range)  bictegravir-emtricitabine-tenofovir AF ([name]) 50-200-25 MG per tablet 1 tablet (has no administration in time range)  brimonidine (ALPHAGAN) 0.15 % ophthalmic solution 1 drop (1 drop Right Eye Not Given 06/21/2021 2302)  montelukast (SINGULAIR) tablet 10 mg (10 mg Oral Not Given 06/13/2021 2226)  multivitamin (RENA-VIT) tablet 1 tablet (1 tablet Oral Not Given 06/11/2021 2226)  pantoprazole (PROTONIX) EC tablet 40 mg (has no administration in time range)  sucroferric oxyhydroxide (VELPHORO) chewable tablet 500 mg (has no administration in time range)  oxyCODONE (Oxy IR/ROXICODONE) immediate release tablet 5 mg (has no administration in time range)  ofloxacin (OCUFLOX) 0.3 % ophthalmic solution 1 drop (1 drop Right Eye Not Given 06/08/2021 2303)  midodrine (PROAMATINE) tablet 10 mg (has no administration in time range)  midodrine (PROAMATINE) tablet 5 mg (has no administration in time range)     ED Course  I have reviewed the triage vital signs and the nursing notes.  Pertinent labs & imaging results that were available during my care of the patient were reviewed by me and considered in my medical decision making (see chart for details).    MDM Rules/Calculators/A&P                          This is a 80 year old male PMHx TIA, nonischemic cardiomyopathy with CHF (NYHA 4), atrial fibrillation (on Eliquis), ESRD (HD TTS), HTN, HLD, CAD, AIDS, discharged 4 days ago after right hip orthopedic procedure, sent from SNF for nausea after dialysis today and overall lethargy.  On exam, AF, VSS, but appears diffusely weak, struggling to talk, but not short of breath.  He is noted to have edema to all 4 extremities, and no obvious focal neurodeficits.  DDx included: CVA, TIA, space-occupying lesion, ICH, sepsis, metabolic derangement, neuromotor disease, ACS, CHF, arrhythmia, anemia  All studies independently reviewed by myself, d/w the attending physician, factored into my MDM. -EKG: NSR 82 bpm, LBBB morphology, no acute ischemic changes, essentially unchanged compared to prior from 06/16/2019 -CXR: No acute cardiopulmonary disease, stable cardiac silhouette enlargement of vascular congestion -CT head noncontrast: No acute intracranial findings -CMP: Mild hyperkalemia 5.4, bicarb 27, AG 17 -BNP: 4128.9 (2788.5) -Troponin: 106 -Mg++: 2.6 (2.2) -Phos: 8.5 (6.9) -INR: 1.8 (1.3) -Unremarkable: Screening BGL, COVID PCR, CBCd, ammonia, TSH, lactic acid x2, APTT -Pending: MRI brain without contrast, cultures  Uncertain of the etiology of patient's weakness at this time, although he does have a significantly elevated BNP suggesting CHF exacerbation versus fluid overload in the context of receiving hemodialysis.  No imaging evidence of CVA, TIA, or space-occupying lesion, and no focal neurodeficits on exam; MRI pending to work-up.  Does not appear acutely septic, although uncertain if in the  context of his AIDS, he is unable to mount a robust immune response.  Acuity suggests against neuromotor disease, no obvious hyper/hyporeflexia.  Initial troponin mildly elevated, but no obvious EKG changes to suggest ACS.  No obvious red flag signs on EKG to suggest arrhythmia.  No acute worsening of anemia at this time.  Given patient's significant fluid overloaded state, as well as unexplained diffuse weakness in the context of multiple comorbidities, feel the patient requires admission.  Hospital service agreed to admit.  Discussed with patient understands and agrees with the plan.  Patient ill, but HDS  on reevaluation.  Subsequently admitted.  Final Clinical Impression(s) / ED Diagnoses Final diagnoses:  Nausea  Weakness    Rx / DC Orders ED Discharge Orders     None        Levin Bacon, MD 06/21/21 3646    Elnora Morrison, MD 06/22/21 0025

## 2021-06-21 ENCOUNTER — Observation Stay (HOSPITAL_COMMUNITY): Payer: Medicare HMO

## 2021-06-21 DIAGNOSIS — N186 End stage renal disease: Secondary | ICD-10-CM

## 2021-06-21 DIAGNOSIS — I5023 Acute on chronic systolic (congestive) heart failure: Secondary | ICD-10-CM

## 2021-06-21 DIAGNOSIS — R509 Fever, unspecified: Secondary | ICD-10-CM | POA: Diagnosis not present

## 2021-06-21 DIAGNOSIS — I4891 Unspecified atrial fibrillation: Secondary | ICD-10-CM | POA: Diagnosis not present

## 2021-06-21 DIAGNOSIS — I517 Cardiomegaly: Secondary | ICD-10-CM | POA: Diagnosis not present

## 2021-06-21 DIAGNOSIS — J9811 Atelectasis: Secondary | ICD-10-CM | POA: Diagnosis not present

## 2021-06-21 DIAGNOSIS — Z992 Dependence on renal dialysis: Secondary | ICD-10-CM

## 2021-06-21 LAB — COMPREHENSIVE METABOLIC PANEL
ALT: 6 U/L (ref 0–44)
ALT: 12 U/L (ref 0–44)
AST: 24 U/L (ref 15–41)
AST: 42 U/L — ABNORMAL HIGH (ref 15–41)
Albumin: 2.9 g/dL — ABNORMAL LOW (ref 3.5–5.0)
Albumin: 3.5 g/dL (ref 3.5–5.0)
Alkaline Phosphatase: 105 U/L (ref 38–126)
Alkaline Phosphatase: 126 U/L (ref 38–126)
Anion gap: 15 (ref 5–15)
Anion gap: 18 — ABNORMAL HIGH (ref 5–15)
BUN: 75 mg/dL — ABNORMAL HIGH (ref 8–23)
BUN: 44 mg/dL — ABNORMAL HIGH (ref 8–23)
CO2: 28 mmol/L (ref 22–32)
CO2: 26 mmol/L (ref 22–32)
Calcium: 8.9 mg/dL (ref 8.9–10.3)
Calcium: 9.5 mg/dL (ref 8.9–10.3)
Chloride: 94 mmol/L — ABNORMAL LOW (ref 98–111)
Chloride: 94 mmol/L — ABNORMAL LOW (ref 98–111)
Creatinine, Ser: 10.68 mg/dL — ABNORMAL HIGH (ref 0.61–1.24)
Creatinine, Ser: 7.23 mg/dL — ABNORMAL HIGH (ref 0.61–1.24)
GFR, Estimated: 4 mL/min — ABNORMAL LOW (ref >60)
GFR, Estimated: 7 mL/min — ABNORMAL LOW (ref >60)
Glucose, Bld: 99 mg/dL (ref 70–99)
Glucose, Bld: 94 mg/dL (ref 70–99)
Potassium: 5.7 mmol/L — ABNORMAL HIGH (ref 3.5–5.1)
Potassium: 5.1 mmol/L (ref 3.5–5.1)
Sodium: 137 mmol/L (ref 135–145)
Sodium: 138 mmol/L (ref 135–145)
Total Bilirubin: 1.1 mg/dL (ref 0.3–1.2)
Total Bilirubin: 1.7 mg/dL — ABNORMAL HIGH (ref 0.3–1.2)
Total Protein: 6.4 g/dL — ABNORMAL LOW (ref 6.5–8.1)
Total Protein: 8.2 g/dL — ABNORMAL HIGH (ref 6.5–8.1)

## 2021-06-21 LAB — CBC WITH DIFFERENTIAL/PLATELET
Abs Immature Granulocytes: 0.04 10*3/uL (ref 0.00–0.07)
Abs Immature Granulocytes: 0.07 10*3/uL (ref 0.00–0.07)
Basophils Absolute: 0 10*3/uL (ref 0.0–0.1)
Basophils Absolute: 0 10*3/uL (ref 0.0–0.1)
Basophils Relative: 0 %
Basophils Relative: 0 %
Eosinophils Absolute: 0 10*3/uL (ref 0.0–0.5)
Eosinophils Absolute: 0 10*3/uL (ref 0.0–0.5)
Eosinophils Relative: 0 %
Eosinophils Relative: 0 %
HCT: 29.6 % — ABNORMAL LOW (ref 39.0–52.0)
HCT: 37.8 % — ABNORMAL LOW (ref 39.0–52.0)
Hemoglobin: 9.8 g/dL — ABNORMAL LOW (ref 13.0–17.0)
Hemoglobin: 12.1 g/dL — ABNORMAL LOW (ref 13.0–17.0)
Immature Granulocytes: 0 %
Immature Granulocytes: 0 %
Lymphocytes Relative: 8 %
Lymphocytes Relative: 2 %
Lymphs Abs: 0.9 10*3/uL (ref 0.7–4.0)
Lymphs Abs: 0.4 10*3/uL — ABNORMAL LOW (ref 0.7–4.0)
MCH: 35.9 pg — ABNORMAL HIGH (ref 26.0–34.0)
MCH: 35.4 pg — ABNORMAL HIGH (ref 26.0–34.0)
MCHC: 33.1 g/dL (ref 30.0–36.0)
MCHC: 32 g/dL (ref 30.0–36.0)
MCV: 108.4 fL — ABNORMAL HIGH (ref 80.0–100.0)
MCV: 110.5 fL — ABNORMAL HIGH (ref 80.0–100.0)
Monocytes Absolute: 1.4 10*3/uL — ABNORMAL HIGH (ref 0.1–1.0)
Monocytes Absolute: 1.7 10*3/uL — ABNORMAL HIGH (ref 0.1–1.0)
Monocytes Relative: 14 %
Monocytes Relative: 10 %
Neutro Abs: 7.9 10*3/uL — ABNORMAL HIGH (ref 1.7–7.7)
Neutro Abs: 15.2 10*3/uL — ABNORMAL HIGH (ref 1.7–7.7)
Neutrophils Relative %: 78 %
Neutrophils Relative %: 88 %
Platelets: 260 10*3/uL (ref 150–400)
Platelets: 214 10*3/uL (ref 150–400)
RBC: 2.73 MIL/uL — ABNORMAL LOW (ref 4.22–5.81)
RBC: 3.42 MIL/uL — ABNORMAL LOW (ref 4.22–5.81)
RDW: 15.4 % (ref 11.5–15.5)
RDW: 15.3 % (ref 11.5–15.5)
WBC: 10.3 10*3/uL (ref 4.0–10.5)
WBC: 17.4 10*3/uL — ABNORMAL HIGH (ref 4.0–10.5)
nRBC: 0 % (ref 0.0–0.2)
nRBC: 0 % (ref 0.0–0.2)

## 2021-06-21 LAB — ECHOCARDIOGRAM COMPLETE
AR max vel: 1.29 cm2
AV Area VTI: 1.26 cm2
AV Area mean vel: 1.25 cm2
AV Mean grad: 11 mmHg
AV Peak grad: 21.3 mmHg
Ao pk vel: 2.31 m/s
Area-P 1/2: 3.6 cm2
Height: 74 in
MV M vel: 4.9 m/s
MV Peak grad: 95.8 mmHg
P 1/2 time: 359 msec
Radius: 0.4 cm
S' Lateral: 6.2 cm
Weight: 2843.05 oz

## 2021-06-21 LAB — I-STAT ARTERIAL BLOOD GAS, ED
Acid-Base Excess: 6 mmol/L — ABNORMAL HIGH (ref 0.0–2.0)
Bicarbonate: 29 mmol/L — ABNORMAL HIGH (ref 20.0–28.0)
Calcium, Ion: 1.07 mmol/L — ABNORMAL LOW (ref 1.15–1.40)
HCT: 31 % — ABNORMAL LOW (ref 39.0–52.0)
Hemoglobin: 10.5 g/dL — ABNORMAL LOW (ref 13.0–17.0)
O2 Saturation: 96 %
Potassium: 5.4 mmol/L — ABNORMAL HIGH (ref 3.5–5.1)
Sodium: 137 mmol/L (ref 135–145)
TCO2: 30 mmol/L (ref 22–32)
pCO2 arterial: 35.9 mmHg (ref 32.0–48.0)
pH, Arterial: 7.516 — ABNORMAL HIGH (ref 7.350–7.450)
pO2, Arterial: 72 mmHg — ABNORMAL LOW (ref 83.0–108.0)

## 2021-06-21 LAB — T-HELPER CELLS (CD4) COUNT (NOT AT ARMC)
CD4 % Helper T Cell: 34 % (ref 33–65)
CD4 T Cell Abs: 349 /uL — ABNORMAL LOW (ref 400–1790)

## 2021-06-21 LAB — TROPONIN I (HIGH SENSITIVITY)
Troponin I (High Sensitivity): 127 ng/L (ref <18)
Troponin I (High Sensitivity): 128 ng/L (ref <18)

## 2021-06-21 LAB — CBG MONITORING, ED: Glucose-Capillary: 110 mg/dL — ABNORMAL HIGH (ref 70–99)

## 2021-06-21 LAB — PHOSPHORUS
Phosphorus: 10.3 mg/dL — ABNORMAL HIGH (ref 2.5–4.6)
Phosphorus: 7.7 mg/dL — ABNORMAL HIGH (ref 2.5–4.6)

## 2021-06-21 LAB — MAGNESIUM
Magnesium: 2.7 mg/dL — ABNORMAL HIGH (ref 1.7–2.4)
Magnesium: 2.6 mg/dL — ABNORMAL HIGH (ref 1.7–2.4)

## 2021-06-21 LAB — HEPATITIS B SURFACE ANTIGEN: Hepatitis B Surface Ag: NONREACTIVE

## 2021-06-21 MED ORDER — ACETAMINOPHEN 650 MG RE SUPP
650.0000 mg | Freq: Four times a day (QID) | RECTAL | Status: DC | PRN
  Administered 2021-06-25: 650 mg via RECTAL
  Filled 2021-06-21 (×3): qty 1

## 2021-06-21 MED ORDER — AMIODARONE HCL IN DEXTROSE 360-4.14 MG/200ML-% IV SOLN
60.0000 mg/h | INTRAVENOUS | Status: AC
  Administered 2021-06-21 (×2): 60 mg/h via INTRAVENOUS
  Filled 2021-06-21: qty 200

## 2021-06-21 MED ORDER — CHLORHEXIDINE GLUCONATE CLOTH 2 % EX PADS
6.0000 | MEDICATED_PAD | Freq: Every day | CUTANEOUS | Status: DC
  Administered 2021-06-22 – 2021-06-27 (×4): 6 via TOPICAL

## 2021-06-21 MED ORDER — HEPARIN (PORCINE) 25000 UT/250ML-% IV SOLN
2000.0000 [IU]/h | INTRAVENOUS | Status: DC
  Administered 2021-06-21: 1150 [IU]/h via INTRAVENOUS
  Administered 2021-06-22: 1650 [IU]/h via INTRAVENOUS
  Administered 2021-06-23: 2000 [IU]/h via INTRAVENOUS
  Administered 2021-06-23: 1800 [IU]/h via INTRAVENOUS
  Administered 2021-06-24: 2000 [IU]/h via INTRAVENOUS
  Filled 2021-06-21 (×5): qty 250

## 2021-06-21 MED ORDER — AMIODARONE HCL IN DEXTROSE 360-4.14 MG/200ML-% IV SOLN
30.0000 mg/h | INTRAVENOUS | Status: DC
  Administered 2021-06-22 – 2021-06-23 (×3): 30 mg/h via INTRAVENOUS
  Filled 2021-06-21 (×4): qty 200

## 2021-06-21 MED ORDER — AMIODARONE LOAD VIA INFUSION
150.0000 mg | Freq: Once | INTRAVENOUS | Status: AC
  Administered 2021-06-21: 150 mg via INTRAVENOUS
  Filled 2021-06-21: qty 83.34

## 2021-06-21 MED ORDER — SODIUM CHLORIDE 0.9 % IV SOLN
2.0000 g | INTRAVENOUS | Status: DC
  Administered 2021-06-21 – 2021-06-23 (×3): 2 g via INTRAVENOUS
  Filled 2021-06-21 (×3): qty 20

## 2021-06-21 NOTE — Progress Notes (Signed)
PROGRESS NOTE    [facility]  CZY:[id number] DOB: 08-29-1941 DOA: 06/14/2021 PCP: [name], NP   Brief Narrative:  The patient is a 80 year old chronically ill-appearing African-American male with a past medical history significant for but not limited to ESRD on hemodialysis Tuesday Thursday Saturday, proximal atrial fibrillation on anticoagulant Eliquis, history of gout, chronic hypotension, GERD, recent admission for right hip fracture status post repair as well as other comorbidities who presented from his SNF for generalized weakness and nausea.  Work-up in the ED revealed electrolyte disturbances, elevated BNP greater than 4000, mild pulmonary edema with concern for acute CHF and elevated troponin.  TRH was asked to admit this patient but patient was unable to provide a subjective history due to his lethargy.  He underwent head imaging with a head CT scan as well as a MRI.  Head CT done showed no acute intracranial findings but did show chronic microvascular ischemic changes and cerebral volume loss.  MRI done showed no acute intracranial abnormality and showed grade generalized volume loss and findings of chronic microvascular ischemia. He remained Lethargic today so will check ABG and EKG. Will get SLP to evaluate.  Patient was dialyzed and after dialysis he was sent to the floor and found to be in significant tachycardia and there is concern for V. tach alert and upon further review he was in A. fib with RVR so he was given IV amiodarone was load and Cardiology was consulted.  Patient still remains lethargic may need a neurology involvement as well as possible infectious disease involvement as He has a history of HIV.  Assessment & Plan:   Active Problems:   ESRD (end stage renal disease) on dialysis ([place])   Acute on chronic systolic CHF (congestive heart failure), NYHA class 4 (HCC)   Atrial fibrillation with RVR (HCC)   Generalized weakness  Acute Encephalopathy and  Lethargy -He underwent head imaging with a head CT scan as well as a MRI.   -Head CT done showed no acute intracranial findings but did show chronic microvascular ischemic changes and cerebral volume loss.   -MRI done showed no acute intracranial abnormality and showed grade generalized volume loss and findings of chronic microvascular ischemia.  -He remained Lethargic today so will check ABG and EEG. Will get SLP to evaluate -ABG    Component Value Date/Time   PHART 7.516 (H) 06/21/2021 1136   PCO2ART 35.9 06/21/2021 1136   PO2ART 72 (L) 06/21/2021 1136   HCO3 29.0 (H) 06/21/2021 1136   TCO2 30 06/21/2021 1136   ACIDBASEDEF 5.0 (H) 02/17/2018 0413   O2SAT 96.0 06/21/2021 1136   -Delirium Precauctions -Check Ammonia Level, RPR, and B12 in the AM  -If into improving will need Neuro evaluation  Generalized weakness post hemodialysis -Patient is unable to tolerate p.o. 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 fib on Eliquis; now with A. fib with RVR -Resumed home amiodarone but he was in A. fib with RVR so we will discontinue this and start amiodarone drip and bolus -Resume home Eliquis for CVA prevention -Monitor on telemetry -Cardiology consulted for further assistance  Hyperkalemia -Serum potassium 5.4 and went to 5.7 -Lokelma 10 g x 1 but was unable to swallow safely so he was taken for dialysis -Repeat renal panel  HIV -Resume HAART medications -Obtain viral load HIV RNA, CD4 count. 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 -Repeat CBC in AM   DVT prophylaxis: Anticoagulated with Apixaban Code Status: FULL CODE  Family Communication: Discussed with Wife at bedside  Disposition Plan: Pending further clinical improvement   Status is: Observation  The patient will require care spanning > 2 midnights and should be moved to inpatient because: Unsafe d/c plan, IV treatments appropriate due to intensity of illness or inability to take PO, and Inpatient level of care appropriate due to severity of illness  Dispo:  Patient From: [place]  Planned Disposition: [place]  Medically stable for discharge: No       Consultants:  Cardiology Nephrology   Procedures: ECHOCARDIOGRAM IMPRESSIONS     1. Left ventricular ejection fraction, by estimation, is 20 to 25%. The  left ventricle has severely decreased function. The left ventricle  demonstrates global hypokinesis. The left ventricular internal cavity size  was severely dilated. Left ventricular  diastolic parameters are consistent with Grade II diastolic dysfunction  (pseudonormalization). Elevated left atrial pressure.   2. Right ventricular systolic function is mildly reduced. The right  ventricular size is severely enlarged. There is normal pulmonary artery  systolic pressure. The estimated right ventricular  systolic pressure is  76.1 mmHg.   3. Left atrial size was moderately dilated.   4. Right atrial size was mildly dilated.   5. The mitral valve is normal in structure. Severe mitral valve  regurgitation.   6. The aortic valve is tricuspid. Aortic valve regurgitation is mild to  moderate. Mild to moderate aortic valve stenosis.   7. There is mild dilatation of the ascending aorta, measuring 42 mm.   8. The inferior vena cava is dilated in size with >50% respiratory  variability, suggesting right atrial pressure of 8 mmHg.   Comparison(s): Prior images reviewed side by side. The left ventricular  function is unchanged. The right ventricular systolic function is worse.  Mitral regurgitation appears more severe. There is systolic flow reversal  in the pulmonary veins. In a  hemodialysis patient, this may be due to different loading conditions.   FINDINGS   Left Ventricle: Left ventricular ejection fraction, by estimation, is 20  to 25%. The left ventricle has severely decreased function. The left  ventricle demonstrates global hypokinesis. The left ventricular internal  cavity size was severely dilated.  There is no left ventricular hypertrophy. Abnormal (paradoxical) septal  motion, consistent with left bundle branch block. Left ventricular  diastolic parameters are consistent with Grade II diastolic dysfunction  (pseudonormalization). Elevated left atrial   pressure.   Right Ventricle: The right ventricular size is severely enlarged. No  increase in right ventricular wall thickness. Right ventricular systolic  function is mildly reduced. There is normal pulmonary artery systolic  pressure. The tricuspid regurgitant  velocity is 2.44 m/s, and with an assumed right atrial pressure of 8 mmHg,  the estimated right ventricular systolic pressure is 16.6 mmHg.   Left Atrium: Left atrial size was moderately dilated.   Right Atrium: Right atrial size was mildly dilated.   Pericardium:  Trivial pericardial effusion is present.   Mitral Valve: There is diastolic MR due to long AV delay/first degree AV  block. The mitral valve is normal in structure. Severe mitral valve  regurgitation, with centrally-directed jet.   Tricuspid Valve: The tricuspid valve is normal in structure. Tricuspid  valve regurgitation is trivial.   Aortic Valve: The aortic valve is tricuspid. Aortic valve regurgitation is  mild to moderate. Aortic regurgitation PHT measures 359 msec. Mild to  moderate aortic stenosis is present. Aortic valve mean gradient measures  11.0 mmHg. Aortic valve peak  gradient measures 21.3 mmHg. Aortic valve area, by VTI measures 1.26 cm.   Pulmonic Valve: The pulmonic valve was grossly normal. Pulmonic valve  regurgitation is not visualized.   Aorta: The aortic root is normal in size and structure. There is mild  dilatation of the ascending aorta, measuring 42 mm.   Venous: A pattern of systolic flow reversal, suggestive of severe mitral  regurgitation is recorded from the right upper pulmonary vein. The  inferior vena cava is dilated in size with greater than 50% respiratory  variability, suggesting right atrial  pressure of 8 mmHg.   IAS/Shunts: No atrial level shunt detected by color flow Doppler.      LEFT VENTRICLE  PLAX 2D  LVIDd:         7.07 cm  Diastology  LVIDs:         6.20 cm  LV e' medial:    4.03 cm/s  LV PW:         1.60 cm  LV E/e' medial:  35.0  LV IVS:        1.00 cm  LV e' lateral:   6.42 cm/s  LVOT diam:     1.90 cm  LV E/e' lateral: 22.0  LV SV:         47  LV SV Index:   23  LVOT Area:     2.84 cm      RIGHT VENTRICLE             IVC  RV Basal diam:  3.80 cm     IVC diam: 2.10 cm  RV Mid diam:    2.80 cm  RV S prime:     11.60 cm/s  TAPSE (M-mode): 2.0 cm   LEFT ATRIUM             Index       RIGHT ATRIUM           Index  LA diam:        4.30 cm 2.08 cm/m  RA Area:     18.40 cm  LA Vol (A2C):   79.3 ml 38.37 ml/m RA Volume:    51.50 ml  24.92 ml/m  LA Vol (A4C):   68.8 ml 33.29 ml/m  LA Biplane Vol: 74.4 ml 36.00 ml/m   AORTIC VALVE  AV Area (Vmax):    1.29 cm  AV Area (Vmean):   1.25 cm  AV Area (VTI):     1.26 cm  AV Vmax:           231.00  cm/s  AV Vmean:          157.500 cm/s  AV VTI:            0.375 m  AV Peak Grad:      21.3 mmHg  AV Mean Grad:      11.0 mmHg  LVOT Vmax:         105.00 cm/s  LVOT Vmean:        69.200 cm/s  LVOT VTI:          0.167 m  LVOT/AV VTI ratio: 0.45  AI PHT:            359 msec     AORTA  Ao Root diam: 3.00 cm  Ao Asc diam:  4.20 cm   MITRAL VALVE                 TRICUSPID VALVE  MV Area (PHT): 3.60 cm      TR Peak grad:   23.8 mmHg  MV Decel Time: 211 msec      TR Vmax:        244.00 cm/s  MR Peak grad:    95.8 mmHg  MR Mean grad:    59.5 mmHg   SHUNTS  MR Vmax:         489.50 cm/s Systemic VTI:  0.17 m  MR Vmean:        364.0 cm/s  Systemic Diam: 1.90 cm  MR PISA:         1.01 cm  MR PISA Eff ROA: 8 mm  MR PISA Radius:  0.40 cm  MV E velocity: 141.00 cm/s  MV A velocity: 110.00 cm/s  MV E/A ratio:  1.28   Antimicrobials:  Anti-infectives (From admission, onward)    Start     Dose/Rate Route Frequency Ordered Stop   06/21/21 1000  bictegravir-emtricitabine-tenofovir AF ([name]) 50-200-25 MG per tablet 1 tablet        1 tablet Oral Daily 06/26/2021 2213          Subjective: Seen and examined at bedside and he was very lethargic.  Seen prior to dialysis and was very fatigued appearing.  Later on after dialysis he was little bit more awake and alert but still very soft-spoken.  He is in A. fib with RVR now but asymptomatic and not complaining of any chest pain or shortness of breath.  No other concerns or questions time.  Objective: Vitals:   06/21/21 1717 06/21/21 1731 06/21/21 1800 06/21/21 1815  BP:   135/78 136/73  Pulse: (!) 38   100  Resp: (!) 21  (!) 25 (!) 23  Temp:  98.7 F (37.1 C)    TempSrc:  Oral    SpO2: 100%  100% 100%  Weight:       Height:        Intake/Output Summary (Last 24 hours) at 06/21/2021 1858 Last data filed at 06/21/2021 1608 Gross per 24 hour  Intake --  Output 2000 ml  Net -2000 ml   Filed Weights   07/02/2021 1530  Weight: 80.6 kg   Examination: Physical Exam:  Constitutional: Thin chronically ill-appearing African-American male currently in no acute distress Eyes: Lids and conjunctivae normal, sclerae anicteric  ENMT: External Ears, Nose appear normal. Grossly normal hearing.  Neck: Appears normal, supple, no cervical masses, normal ROM, no appreciable thyromegaly; no JVD Respiratory: Diminished to auscultation bilaterally, no wheezing, rales, rhonchi or crackles. Normal respiratory effort and patient is not tachypenic. No  accessory muscle use.  Unlabored breathing Cardiovascular: Tachycardic rate and irregular rhythm, no murmurs / rubs / gallops. S1 and S2 auscultated.  Mild lower extremity edema Abdomen: Soft, non-tender, non-distended. Bowel sounds positive.  GU: Deferred. Musculoskeletal: No clubbing / cyanosis of digits/nails. No joint deformity upper and lower extremities.  Skin: No rashes, lesions, ulcers on limited skin evaluation. No induration; Warm and dry.  Neurologic: Very slow to respond but cranial nerves II through XII grossly intact Psychiatric: Normal judgment and insight. Alert and oriented x 1. Normal mood and appropriate affect.   Data Reviewed: I have personally reviewed following labs and imaging studies  CBC: Recent Labs  Lab 06/15/21 0156 06/16/21 0747 06/19/2021 1700 06/07/2021 2105 06/21/21 0814 06/21/21 1136  WBC 6.7 6.1 9.3 9.7 10.3  --   NEUTROABS  --   --  7.4  --  7.9*  --   HGB 9.6* 10.4* 10.7* 10.8* 9.8* 10.5*  HCT 29.6* 31.8* 32.9* 33.4* 29.6* 31.0*  MCV 108.8* 107.8* 108.2* 110.2* 108.4*  --   PLT 162 189 281 260 260  --    Basic Metabolic Panel: Recent Labs  Lab 06/15/21 0156 06/16/21 0747 06/07/2021 1700 06/04/2021 2105 06/21/21 0814  06/21/21 1136  NA 136 135 137  --  137 137  K 4.3 4.3 5.4*  --  5.7* 5.4*  CL 96* 96* 93*  --  94*  --   CO2 25 24 27   --  28  --   GLUCOSE 87 127* 114*  --  99  --   BUN 41* 60* 56*  --  75*  --   CREATININE 6.84* 9.50* 9.26* 9.68* 10.68*  --   CALCIUM 8.6* 8.9 8.9  --  8.9  --   MG  --   --  2.6*  --  2.7*  --   PHOS  --  6.9* 8.5*  --  10.3*  --    GFR: Estimated Creatinine Clearance: 6.4 mL/min (A) (by C-G formula based on SCr of 10.68 mg/dL (H)). Liver Function Tests: Recent Labs  Lab 06/16/21 0747 06/03/2021 1700 06/21/21 0814  AST  --  29 24  ALT  --  7 6  ALKPHOS  --  123 105  BILITOT  --  1.1 1.1  PROT  --  7.0 6.4*  ALBUMIN 3.3* 3.2* 2.9*   No results for input(s): LIPASE, AMYLASE in the last 168 hours. Recent Labs  Lab 06/23/2021 1700  AMMONIA 31   Coagulation Profile: Recent Labs  Lab 06/23/2021 1700  INR 1.8*   Cardiac Enzymes: No results for input(s): CKTOTAL, CKMB, CKMBINDEX, TROPONINI in the last 168 hours. BNP (last 3 results) No results for input(s): PROBNP in the last 8760 hours. HbA1C: No results for input(s): HGBA1C in the last 72 hours. CBG: Recent Labs  Lab 06/09/2021 1529 06/15/2021 2212 06/21/21 0831  GLUCAP 116* 111* 110*   Lipid Profile: No results for input(s): CHOL, HDL, LDLCALC, TRIG, CHOLHDL, LDLDIRECT in the last 72 hours. Thyroid Function Tests: Recent Labs    06/21/2021 1700  TSH 1.225   Anemia Panel: No results for input(s): VITAMINB12, FOLATE, FERRITIN, TIBC, IRON, RETICCTPCT in the last 72 hours. Sepsis Labs: Recent Labs  Lab 07/03/2021 1700 06/16/2021 2105  LATICACIDVEN 1.8 1.6    Recent Results (from the past 240 hour(s))  Surgical pcr screen     Status: None   Collection Time: 06/11/21 11:19 PM   Specimen: Nasal Mucosa; Nasal Swab  Result Value Ref Range Status  MRSA, PCR NEGATIVE NEGATIVE Final   Staphylococcus aureus NEGATIVE NEGATIVE Final    Comment: (NOTE) The Xpert SA Assay (FDA approved for NASAL specimens  in patients 26 years of age and older), is one component of a comprehensive surveillance program. It is not intended to diagnose infection nor to guide or monitor treatment. Performed at [facility], [place] [address], [place], [place] [address]   Resp Panel by RT-PCR (Flu A&B, Covid) Nasopharyngeal Swab     Status: None   Collection Time: 06/16/21  5:04 PM   Specimen: Nasopharyngeal Swab; Nasopharyngeal(NP) swabs in vial transport medium  Result Value Ref Range Status   SARS Coronavirus 2 by RT PCR NEGATIVE NEGATIVE Final    Comment: (NOTE) SARS-CoV-2 target nucleic acids are NOT DETECTED.  The SARS-CoV-2 RNA is generally detectable in upper respiratory specimens during the acute phase of infection. The lowest concentration of SARS-CoV-2 viral copies this assay can detect is 138 copies/mL. A negative result does not preclude SARS-Cov-2 infection and should not be used as the sole basis for treatment or other patient management decisions. A negative result may occur with  improper specimen collection/handling, submission of specimen other than nasopharyngeal swab, presence of viral mutation(s) within the areas targeted by this assay, and inadequate number of viral copies(<138 copies/mL). A negative result must be combined with clinical observations, patient history, and epidemiological information. The expected result is Negative.  Fact Sheet for Patients:  EntrepreneurPulse.com.au  Fact Sheet for Healthcare Providers:  IncredibleEmployment.be  This test is no t yet approved or cleared by the [place] FDA and  has been authorized for detection and/or diagnosis of SARS-CoV-2 by FDA under an Emergency Use Authorization (EUA). This EUA will remain  in effect (meaning this test can be used) for the duration of the COVID-19 declaration under Section 564(b)(1) of the Act, 21 U.S.C.section 360bbb-3(b)(1), unless the authorization is  terminated  or revoked sooner.       Influenza A by PCR NEGATIVE NEGATIVE Final   Influenza B by PCR NEGATIVE NEGATIVE Final    Comment: (NOTE) The Xpert Xpress SARS-CoV-2/FLU/RSV plus assay is intended as an aid in the diagnosis of influenza from Nasopharyngeal swab specimens and should not be used as a sole basis for treatment. Nasal washings and aspirates are unacceptable for Xpert Xpress SARS-CoV-2/FLU/RSV testing.  Fact Sheet for Patients: EntrepreneurPulse.com.au  Fact Sheet for Healthcare Providers: IncredibleEmployment.be  This test is not yet approved or cleared by the [place] FDA and has been authorized for detection and/or diagnosis of SARS-CoV-2 by FDA under an Emergency Use Authorization (EUA). This EUA will remain in effect (meaning this test can be used) for the duration of the COVID-19 declaration under Section 564(b)(1) of the Act, 21 U.S.C. section 360bbb-3(b)(1), unless the authorization is terminated or revoked.  Performed at [facility], [place] [address], [place], [place] [address]   Blood culture (routine x 2)     Status: None (Preliminary result)   Collection Time: 06/24/2021  3:53 PM   Specimen: BLOOD  Result Value Ref Range Status   Specimen Description BLOOD SITE NOT SPECIFIED  Final   Special Requests   Final    BOTTLES DRAWN AEROBIC AND ANAEROBIC Blood Culture results may not be optimal due to an excessive volume of blood received in culture bottles IV START   Culture   Final    NO GROWTH < 12 HOURS Performed at [facility], [place] [address], [place], [place] [address]  Report Status PENDING  Incomplete  SARS CORONAVIRUS 2 (TAT 6-24 HRS) Nasopharyngeal Nasopharyngeal Swab     Status: None   Collection Time: 06/05/2021  4:50 PM   Specimen: Nasopharyngeal Swab  Result Value Ref Range Status   SARS Coronavirus 2 NEGATIVE NEGATIVE Final    Comment: (NOTE) SARS-CoV-2 target nucleic acids are NOT  DETECTED.  The SARS-CoV-2 RNA is generally detectable in upper and lower respiratory specimens during the acute phase of infection. Negative results do not preclude SARS-CoV-2 infection, do not rule out co-infections with other pathogens, and should not be used as the sole basis for treatment or other patient management decisions. Negative results must be combined with clinical observations, patient history, and epidemiological information. The expected result is Negative.  Fact Sheet for Patients: SugarRoll.be  Fact Sheet for Healthcare Providers: https://www.woods-mathews.com/  This test is not yet approved or cleared by the [place] FDA and  has been authorized for detection and/or diagnosis of SARS-CoV-2 by FDA under an Emergency Use Authorization (EUA). This EUA will remain  in effect (meaning this test can be used) for the duration of the COVID-19 declaration under Se ction 564(b)(1) of the Act, 21 U.S.C. section 360bbb-3(b)(1), unless the authorization is terminated or revoked sooner.  Performed at [facility], [place] [address], [place], [place] [address]   Blood culture (routine x 2)     Status: None (Preliminary result)   Collection Time: 06/25/2021  9:08 PM   Specimen: BLOOD  Result Value Ref Range Status   Specimen Description BLOOD LEFT HAND  Final   Special Requests   Final    BOTTLES DRAWN AEROBIC AND ANAEROBIC Blood Culture adequate volume   Culture   Final    NO GROWTH < 12 HOURS Performed at [facility], [place] [address], [place], [place] [address]    Report Status PENDING  Incomplete    RN Pressure Injury Documentation:     Estimated body mass index is 22.81 kg/m as calculated from the following:   Height as of this encounter: 6\' 2"  (1.88 m).   Weight as of this encounter: 80.6 kg.  Malnutrition Type:   Malnutrition Characteristics:   Nutrition Interventions:    Radiology Studies: CT Head  Wo Contrast  Result Date: 06/15/2021 CLINICAL DATA:  Altered mental status EXAM: CT HEAD WITHOUT CONTRAST TECHNIQUE: Contiguous axial images were obtained from the base of the skull through the vertex without intravenous contrast. COMPARISON:  09/05/2019 FINDINGS: Brain: No evidence of acute infarction, hemorrhage, hydrocephalus, extra-axial collection or mass lesion/mass effect. Mild-moderate low-density changes within the periventricular and subcortical white matter compatible with chronic microvascular ischemic change. Mild diffuse cerebral volume loss with stable size and configuration of the ventricles. Vascular: No hyperdense vessel or unexpected calcification. Skull: Normal. Negative for fracture or focal lesion. Sinuses/Orbits: No acute finding. Other: None. IMPRESSION: 1. No acute intracranial findings. 2. Chronic microvascular ischemic change and cerebral volume loss. Electronically Signed   By: Davina Poke D.O.   On: 06/15/2021 17:04   MR Brain Wo Contrast (neuro protocol)  Result Date: 06/25/2021 CLINICAL DATA:  Encephalopathy EXAM: MRI HEAD WITHOUT CONTRAST TECHNIQUE: Multiplanar, multiecho pulse sequences of the brain and surrounding structures were obtained without intravenous contrast. COMPARISON:  01/07/2019 FINDINGS: Brain: No acute infarct, mass effect or extra-axial collection. No acute or chronic hemorrhage. Hyperintense T2-weighted signal is moderately widespread throughout the white matter. Generalized volume loss without a clear lobar predilection. The midline structures are normal. Vascular: Major flow voids  are preserved. Skull and upper cervical spine: Normal calvarium and skull base. Visualized upper cervical spine and soft tissues are normal. Sinuses/Orbits:No paranasal sinus fluid levels or advanced mucosal thickening. No mastoid or middle ear effusion. Normal orbits. IMPRESSION: 1. No acute intracranial abnormality. 2. Generalized volume loss and findings of chronic  microvascular ischemia. Electronically Signed   By: Ulyses Jarred M.D.   On: 06/03/2021 23:20   DG Chest Portable 1 View  Result Date: 06/09/2021 CLINICAL DATA:  Recent right hip fracture, lethargy, nausea EXAM: PORTABLE CHEST 1 VIEW COMPARISON:  06/10/2021 FINDINGS: Single frontal view of the chest demonstrates an enlarged cardiac silhouette. There is chronic central vascular congestion without airspace disease, effusion, or pneumothorax. No acute bony abnormalities. IMPRESSION: 1. Stable enlarged cardiac silhouette and vascular congestion. No acute airspace disease. Electronically Signed   By: Randa Ngo M.D.   On: 06/05/2021 16:37   ECHOCARDIOGRAM COMPLETE  Result Date: 06/21/2021    ECHOCARDIOGRAM REPORT   Patient Name:   TOWNSEND CUDWORTH Diep Date of Exam: 06/21/2021 Medical Rec #:  [id number]            Height:       74.0 in Accession #:    [id number]           Weight:       177.7 lb Date of Birth:  September 14, 1941            BSA:          2.067 m Patient Age:    33 years             BP:           126/66 mmHg Patient Gender: M                    HR:           89 bpm. Exam Location:  Inpatient Procedure: 2D Echo, Cardiac Doppler and Color Doppler Indications:    I50.23 Acute on chronic systolic (congestive) heart failure  History:        Patient has prior history of Echocardiogram examinations, most                 recent 12/01/2020. Cardiomyopathy, Stroke, Arrythmias:Atrial                 Fibrillation, Signs/Symptoms:Murmur; Risk Factors:Hypertension,                 Dyslipidemia and Sleep Apnea. ESRD. COVID-19. GERD.  Sonographer:    Jonelle Sidle Dance Referring Phys: [id number] [place]  1. Left ventricular ejection fraction, by estimation, is 20 to 25%. The left ventricle has severely decreased function. The left ventricle demonstrates global hypokinesis. The left ventricular internal cavity size was severely dilated. Left ventricular diastolic parameters are consistent with Grade II diastolic  dysfunction (pseudonormalization). Elevated left atrial pressure.  2. Right ventricular systolic function is mildly reduced. The right ventricular size is severely enlarged. There is normal pulmonary artery systolic pressure. The estimated right ventricular systolic pressure is 60.6 mmHg.  3. Left atrial size was moderately dilated.  4. Right atrial size was mildly dilated.  5. The mitral valve is normal in structure. Severe mitral valve regurgitation.  6. The aortic valve is tricuspid. Aortic valve regurgitation is mild to moderate. Mild to moderate aortic valve stenosis.  7. There is mild dilatation of the ascending aorta, measuring 42 mm.  8. The inferior vena cava is dilated in size with >50% respiratory  variability, suggesting right atrial pressure of 8 mmHg. Comparison(s): Prior images reviewed side by side. The left ventricular function is unchanged. The right ventricular systolic function is worse. Mitral regurgitation appears more severe. There is systolic flow reversal in the pulmonary veins. In a hemodialysis patient, this may be due to different loading conditions. FINDINGS  Left Ventricle: Left ventricular ejection fraction, by estimation, is 20 to 25%. The left ventricle has severely decreased function. The left ventricle demonstrates global hypokinesis. The left ventricular internal cavity size was severely dilated. There is no left ventricular hypertrophy. Abnormal (paradoxical) septal motion, consistent with left bundle branch block. Left ventricular diastolic parameters are consistent with Grade II diastolic dysfunction (pseudonormalization). Elevated left atrial  pressure. Right Ventricle: The right ventricular size is severely enlarged. No increase in right ventricular wall thickness. Right ventricular systolic function is mildly reduced. There is normal pulmonary artery systolic pressure. The tricuspid regurgitant velocity is 2.44 m/s, and with an assumed right atrial pressure of 8 mmHg, the  estimated right ventricular systolic pressure is 95.1 mmHg. Left Atrium: Left atrial size was moderately dilated. Right Atrium: Right atrial size was mildly dilated. Pericardium: Trivial pericardial effusion is present. 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 The mitral valve is normal in structure. Severe mitral valve regurgitation, with centrally-directed jet. Tricuspid Valve: The tricuspid valve is normal in structure. Tricuspid valve regurgitation is trivial. Aortic Valve: The aortic valve is tricuspid. Aortic valve regurgitation is mild to moderate. Aortic regurgitation PHT measures 359 msec. Mild to moderate aortic stenosis is present. Aortic valve mean gradient measures 11.0 mmHg. Aortic valve peak gradient measures 21.3 mmHg. Aortic valve area, by VTI measures 1.26 cm. Pulmonic Valve: The pulmonic valve was grossly normal. Pulmonic valve regurgitation is not visualized. Aorta: The aortic root is normal in size and structure. There is mild dilatation of the ascending aorta, measuring 42 mm. Venous: A pattern of systolic flow reversal, suggestive of severe mitral regurgitation is recorded from the right upper pulmonary vein. The inferior vena cava is dilated in size with greater than 50% respiratory variability, suggesting right atrial pressure of 8 mmHg. IAS/Shunts: No atrial level shunt detected by color flow Doppler.  LEFT VENTRICLE PLAX 2D LVIDd:         7.07 cm  Diastology LVIDs:         6.20 cm  LV e' medial:    4.03 cm/s LV PW:         1.60 cm  LV E/e' medial:  35.0 LV IVS:        1.00 cm  LV e' lateral:   6.42 cm/s LVOT diam:     1.90 cm  LV E/e' lateral: 22.0 LV SV:         47 LV SV Index:   23 LVOT Area:     2.84 cm  RIGHT VENTRICLE             IVC RV Basal diam:  3.80 cm     IVC diam: 2.10 cm RV Mid diam:    2.80 cm RV S prime:     11.60 cm/s TAPSE (M-mode): 2.0 cm LEFT ATRIUM             Index       RIGHT ATRIUM           Index LA diam:        4.30 cm 2.08  cm/m  RA Area:     18.40 cm  LA Vol (A2C):   79.3 ml 38.37 ml/m RA Volume:   51.50 ml  24.92 ml/m LA Vol (A4C):   68.8 ml 33.29 ml/m LA Biplane Vol: 74.4 ml 36.00 ml/m  AORTIC VALVE AV Area (Vmax):    1.29 cm AV Area (Vmean):   1.25 cm AV Area (VTI):     1.26 cm AV Vmax:           231.00 cm/s AV Vmean:          157.500 cm/s AV VTI:            0.375 m AV Peak Grad:      21.3 mmHg AV Mean Grad:      11.0 mmHg LVOT Vmax:         105.00 cm/s LVOT Vmean:        69.200 cm/s LVOT VTI:          0.167 m LVOT/AV VTI ratio: 0.45 AI PHT:            359 msec  AORTA Ao Root diam: 3.00 cm Ao Asc diam:  4.20 cm MITRAL VALVE                 TRICUSPID VALVE MV Area (PHT): 3.60 cm      TR Peak grad:   23.8 mmHg MV Decel Time: 211 msec      TR Vmax:        244.00 cm/s MR Peak grad:    95.8 mmHg MR Mean grad:    59.5 mmHg   SHUNTS MR Vmax:         489.50 cm/s Systemic VTI:  0.17 m MR Vmean:        364.0 cm/s  Systemic Diam: 1.90 cm MR PISA:         1.01 cm MR PISA Eff ROA: 8 mm MR PISA Radius:  0.40 cm MV E velocity: 141.00 cm/s MV A velocity: 110.00 cm/s MV E/A ratio:  1.28 Mihai Croitoru MD Electronically signed by [name] MD Signature Date/Time: 06/21/2021/3:17:41 PM    Final     Scheduled Meds:  allopurinol  100 mg Oral Once per day on Mon Tue Thu Sat   apixaban  5 mg Oral BID   bictegravir-emtricitabine-tenofovir AF  1 tablet Oral Daily   brimonidine  1 drop Right Eye BID   Chlorhexidine Gluconate Cloth  6 each Topical Q0600   midodrine  10 mg Oral 2 times per day on Tue Thu Sat   [START ON 06/22/2021] midodrine  5 mg Oral Once per day on Sun Mon Wed Fri   montelukast  10 mg Oral QHS   multivitamin  1 tablet Oral QPM   ofloxacin  1 drop Right Eye QID   pantoprazole  40 mg Oral Daily   sodium zirconium cyclosilicate  10 g Oral Once   sucroferric oxyhydroxide  500 mg Oral TID with meals   Continuous Infusions:  amiodarone 60 mg/hr (06/21/21 1739)   Followed by   Derrill Memo ON 06/22/2021] amiodarone       LOS: 0 days   Kerney Elbe, DO [facility] PAGER is on AMION  If 7PM-7AM, please contact night-coverage [email or web address]

## 2021-06-21 NOTE — Progress Notes (Signed)
  Echocardiogram 2D Echocardiogram has been performed.  Maggi Hershkowitz G Stacie Knutzen 06/21/2021, 10:53 AM

## 2021-06-21 NOTE — Consult Note (Addendum)
[facility] Renal Consultation Note    Indication for Consultation:  Management of ESRD/hemodialysis, anemia, hypertension/volume, and secondary hyperparathyroidism. PCP:  HPI: David [name] is a 80 y.o. 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  Seen in ED room - patient ill appearing and able to answer a few questions but not give a fill Hx. Per notes, brought to ED after dialysis with concern of lethargy/weakness and nausea. Denied CP, dyspnea, N/V, diarrhea, sore throat, dysuria, edema. Labs showed Na 137, K 5.4, Ca 8.9, BNP 4128, LA 1.8 -> 1.6, WBC 9.3, Hgb 10.7. CXR clear, head CT and brain MRI without acute findings.  Dialyzes on MTTS at [facility]. Did have HD yesterday, runs 4 days/wk chronically due to severe HF. Uses AVG as his access - not recent issues noted.  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   Acute on chronic systolic and diastolic heart failure, NYHA class 4 (HCC)    Anemia, iron deficiency 11/15/2011   Arthritis    "hands, right knee, feet" (02/21/2018)   Atrial fibrillation (HCC)    Cardiac arrest ([place]) 02/17/2018   CHF (congestive heart failure) (HCC)    Chronic combined systolic and diastolic heart failure, NYHA class 3 ([place]) 06/2010   felt to be secondary to hypertensive cardiomyopathy   Chronic lower back pain    CKD (chronic kidney disease) stage V requiring chronic dialysis ([place])    COVID-19 12/2019   ESRD on dialysis [facility])    started 02/2018 [place]   GERD (gastroesophageal reflux disease)    Glaucoma    "I had surgery and dont have it any more"   Gout    daily RX (02/21/2018)   Heart murmur    "mild" per pt   Hepatitis    years ago   HIV (human immunodeficiency virus infection) ([place])     HIV infection ([place])    Hyperlipidemia    Hypertension    Nonischemic cardiomyopathy ([place])    Pneumonia 11/2017   Prostate cancer ([place])    hx of prostate; s/p radioactive seed implant 10/2009 Dr Janice Norrie   Sinus bradycardia    Sleep apnea    does not use a cpap   Stroke [facility])    "mini stroke" years ago   Wears glasses    Past Surgical History:  Procedure Laterality Date   A/V FISTULAGRAM Bilateral 07/30/2020   Procedure: CENTRAL VENOGRAM;  Surgeon: Angelia Mould, MD;  Location: [place] [facility];  Service: Cardiovascular;  Laterality: Bilateral;  bilateral upper venogram    AV FISTULA PLACEMENT Right 10/13/2016   Procedure: ARTERIOVENOUS (AV) FISTULA CREATION;  Surgeon: [name], MD;  Location: [place];  Service: Vascular;  Laterality: Right;   AV FISTULA PLACEMENT Left 02/25/2018   Procedure: INSERTION OF ARTERIOVENOUS (AV) GORE-TEX GRAFT LEFT UPPER ARM;  Surgeon: Angelia Mould, MD;  Location: [place];  Service: Vascular;  Laterality: Left;   AV FISTULA PLACEMENT Right 08/07/2018   Procedure: Creation of right arm brachiocephalic Fistula;  Surgeon: Waynetta Sandy, MD;  Location: [facility] OR;  Service: Vascular;  Laterality: Right;   AV FISTULA PLACEMENT Left 11/10/2019   Procedure: INSERTION OF ARTERIOVENOUS (AV) GORE-TEX GRAFT THIGH;  Surgeon: Elam Dutch, MD;  Location: [place];  Service: Vascular;  Laterality: Left;  AV FISTULA PLACEMENT Right 12/02/2019   Procedure: INSERTION OF ARTERIOVENOUS (AV) GORE-TEX GRAFT RIGHT  THIGH;  Surgeon: Waynetta Sandy, MD;  Location: [place];  Service: Vascular;  Laterality: Right;   AV FISTULA PLACEMENT Right 08/13/2020   Procedure: INSERTION OF RIGHT ARTERIOVENOUS (AV) GORE-TEX GRAFT ARM;  Surgeon: Angelia Mould, MD;  Location: [place];  Service: Vascular;  Laterality: Right;   [place] Left 06/24/2015   Procedure: BASILIC VEIN TRANSPOSITION;  Surgeon: Mal Misty, MD;  Location: [place];   Service: Vascular;  Laterality: Left;   BIOPSY  03/14/2019   Procedure: BIOPSY;  Surgeon: Irene Shipper, MD;  Location: [place];  Service: Endoscopy;;   CATARACT EXTRACTION, BILATERAL Bilateral    COLONOSCOPY WITH PROPOFOL N/A 03/14/2019   Procedure: COLONOSCOPY WITH PROPOFOL;  Surgeon: Irene Shipper, MD;  Location: [place];  Service: Endoscopy;  Laterality: N/A;   ESOPHAGOGASTRODUODENOSCOPY (EGD) WITH PROPOFOL N/A 05/05/2014   Procedure: ESOPHAGOGASTRODUODENOSCOPY (EGD) WITH PROPOFOL;  Surgeon: Missy Sabins, MD;  Location: [facility];  Service: Endoscopy;  Laterality: N/A;   EYE SURGERY Bilateral    cataract surgery    GLAUCOMA SURGERY Bilateral    HIP PINNING,CANNULATED Right 06/12/2021   Procedure: CANNULATED RIGHT HIP PINNING;  Surgeon: Jessy Oto, MD;  Location: [place];  Service: Orthopedics;  Laterality: Right;   INSERTION OF ARTERIOVENOUS (AV) ARTEGRAFT ARM  10/09/2018   Procedure: INSERTION OF ARTERIOVENOUS (AV) 4mm x 41cm ARTEGRAFT LEFT UPPER ARM;  Surgeon: Waynetta Sandy, MD;  Location: [place];  Service: Vascular;;   INSERTION OF DIALYSIS CATHETER Right 07/14/2019   Procedure: Insertion Of Dialysis Catheter;  Surgeon: Elam Dutch, MD;  Location: [facility] OR;  Service: Vascular;  Laterality: Right;  placed right Internal Jugular   INSERTION OF DIALYSIS CATHETER Left 08/13/2020   Procedure: INSERTION OF DIALYSIS CATHETER;  Surgeon: Angelia Mould, MD;  Location: [place];  Service: Vascular;  Laterality: Left;   INSERTION PROSTATE RADIATION SEED     IR FLUORO GUIDE CV LINE RIGHT  02/18/2018   IR REMOVAL TUN CV CATH W/O FL  12/06/2018   IR THROMBECTOMY AV FISTULA W/THROMBOLYSIS INC/SHUNT/IMG RIGHT  11/12/2020   IR THROMBECTOMY AV FISTULA W/THROMBOLYSIS/PTA INC/SHUNT/IMG RIGHT Right 01/26/2020   IR THROMBECTOMY AV FISTULA W/THROMBOLYSIS/PTA INC/SHUNT/IMG RIGHT Right 02/24/2020   IR THROMBECTOMY AV FISTULA W/THROMBOLYSIS/PTA INC/SHUNT/IMG RIGHT Right 04/07/2020   IR US  GUIDE VASC ACCESS RIGHT  02/18/2018   IR US GUIDE VASC ACCESS RIGHT  01/26/2020   IR US GUIDE VASC ACCESS RIGHT  04/07/2020   IR US GUIDE VASC ACCESS RIGHT  11/12/2020   LEFT HEART CATH AND CORONARY ANGIOGRAPHY N/A 02/20/2018   Procedure: LEFT HEART CATH AND CORONARY ANGIOGRAPHY;  Surgeon: Jettie Booze, MD;  [facility];;; 50% ostial OM2, 25% mLAD.   LEFT HEART CATH AND CORONARY ANGIOGRAPHY  2004   mildly depressed LV systolic fx EF 58%,NIDPOE coronaries/abdominal aorta/renal arteries.   LOWER EXTREMITY ANGIOGRAM Left 11/17/2019   Procedure: Lower Extremity Fistulogram;  Surgeon: Marty Heck, MD;  Location: [place];  Service: Vascular;  Laterality: Left;   NM MYOCAR PERF WALL MOTION  02/21/2010   No ischemia or infarction.  EF 27%.   RADIOACTIVE SEED IMPLANT  2010   prostate cancer   REMOVAL OF A DIALYSIS CATHETER Right 08/13/2020   Procedure: REMOVAL OF A DIALYSIS CATHETER;  Surgeon: Angelia Mould, MD;  Location: [place];  Service: Vascular;  Laterality: Right;  REVISION OF ARTERIOVENOUS GORETEX GRAFT Right 07/11/2019   Procedure: REVISION OF ARTERIOVENOUS GORETEX GRAFT RIGHT ARM WITH ARTEGRAFT;  Surgeon: Serafina Mitchell, MD;  Location: [place];  Service: Vascular;  Laterality: Right;   SHUNTOGRAM Right 07/14/2019   Procedure: [place];  Surgeon: Elam Dutch, MD;  Location: [place];  Service: Vascular;  Laterality: Right;   THROMBECTOMY AND REVISION OF ARTERIOVENTOUS (AV) GORETEX  GRAFT Right 07/14/2019   Procedure: THROMBECTOMY AND REVISION OF ARTERIOVENTOUS (AV) GORETEX  GRAFT;  Surgeon: Elam Dutch, MD;  Location: [place];  Service: Vascular;  Laterality: Right;   THROMBECTOMY W/ EMBOLECTOMY Left 02/26/2018   Procedure: THROMBECTOMY ARTERIOVENOUS GRAFT;  Surgeon: Angelia Mould, MD;  Location: [place];  Service: Vascular;  Laterality: Left;   THROMBECTOMY W/ EMBOLECTOMY Left 11/17/2019   Procedure: THROMBECTOMY ARTERIOVENOUS GRAFT LEFT THIGH;   Surgeon: Marty Heck, MD;  Location: [place];  Service: Vascular;  Laterality: Left;   TRANSTHORACIC ECHOCARDIOGRAM  02/2012   ([place]) EF 45-50% with mild global HK.  Mild LVH,LA mod. dilated,mild-mod. 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 AI.   TRANSTHORACIC ECHOCARDIOGRAM  9/'18; 3/'19   a) Severely reduced EF.  25 from 30%.  GR 1 DD with diffuse ecchymosis. Mild AS & AI.  Mild LA/RA dilation; b) (post PEA arest):   Moderately dilated LV with moderate concentric virtually.  EF 15 to 20%.  GR 1 DD.  Paradoxical septal motion. Mild AI.  Mild LA dilation.  Poorly visualized RV.   TRANSTHORACIC ECHOCARDIOGRAM  02/2019   EF 20-25%.  Unable to assess diastolic function (A. Fib).  No LV apical thrombus.  Mild AI.  Mildly reduced RV function, however poorly visualized.   ULTRASOUND GUIDANCE FOR VASCULAR ACCESS  02/20/2018   Procedure: Ultrasound Guidance For Vascular Access;  Surgeon: Jettie Booze, MD;  Location: [place] [facility];  Service: Cardiovascular;;   UPPER EXTREMITY VENOGRAPHY N/A 07/08/2018   Procedure: UPPER EXTREMITY VENOGRAPHY;  Surgeon: Waynetta Sandy, MD;  Location: [place] [facility];  Service: Cardiovascular;  Laterality: N/A;  Bilateral   Family History  Problem Relation Age of Onset   Hypertension Mother    Thyroid disease Mother    Cholelithiasis Daughter    Cholelithiasis Son    Hypertension Maternal Grandmother    Diabetes Maternal Grandmother    Heart attack Neg Hx    Hyperlipidemia Neg Hx    Social History:  reports that he quit smoking about 16 years ago. His smoking use included cigarettes. He has a 30.00 pack-year smoking history. He has never used smokeless tobacco. He reports current alcohol use of about 1.0 standard drink of alcohol per week. He reports that he does not use drugs.  ROS: Unable to get full ROS - AMS.  Physical Exam: Vitals:   06/21/21 1015 06/21/21 1030 06/21/21 1045 06/21/21 1100  BP: 125/70  134/70 133/66 137/74  Pulse: 91 88 88 91  Resp: 20 20 (!) 24 (!) 21  Temp:    98.1 F (36.7 C)  TempSrc:    Oral  SpO2: 100% 100% 100% 100%  Weight:      Height:         General: Ill appearing man, NAD. Diaphoretic. Minimally conversant. Head: Normocephalic, atraumatic, sclera non-icteric, mucus membranes are moist. Neck: Supple without lymphadenopathy/masses. JVD not elevated. Lungs: Clear bilaterally to auscultation without wheezes, rales, or rhonchi. Breathing is unlabored. Heart: RRR; 2/6 murmur Abdomen: Soft, non-tender, non-distended with normoactive bowel sounds.  Musculoskeletal:  Strength and tone appear normal for age. Lower extremities: No edema or ischemic changes, no open wounds. Neuro: Lethargic Dialysis Access: L AVG + bruit  Allergies  Allergen Reactions   Dextromethorphan-Guaifenesin Other (See Comments)    Unknown reaction   Tocotrienols Other (See Comments)    Unknown reaction   Losartan Potassium Other (See Comments)    Causes constipation   Prior to Admission medications   Medication Sig Start Date End Date Taking? Authorizing Provider  acetaminophen (TYLENOL) 500 MG tablet Take 1,000 mg by mouth every 6 (six) hours as needed for moderate pain.   Yes [provider]  allopurinol (ZYLOPRIM) 100 MG tablet TAKE 1 TABLET BY MOUTH 4 TIMES WEEKLY( MONDAY, [place]) AFTER DIALYSIS Patient taking differently: Take 100 mg by mouth See admin instructions. TAKE 1 TABLET BY MOUTH 4 TIMES WEEKLY( MONDAY, TUESDAY,THURDSAY AND SATURDAY) AFTER DIALYSIS 10/20/20  Yes Croitoru, Mihai, MD  amiodarone (PACERONE) 200 MG tablet TAKE 1 TABLET(200 MG) BY MOUTH DAILY Patient taking differently: Take 200 mg by mouth daily. 10/20/20  Yes Croitoru, Mihai, MD  apixaban (ELIQUIS) 5 MG TABS tablet TAKE 1 TABLET(5 MG) BY MOUTH TWICE DAILY Patient taking differently: Take 5 mg by mouth 2 (two) times daily. 03/29/21  Yes Croitoru, Mihai, MD  [name] 50-200-25  MG TABS tablet TAKE 1 TABLET BY MOUTH DAILY 04/06/21  Yes Tommy Medal, Lavell Islam, MD  brimonidine (ALPHAGAN P) 0.1 % SOLN Place 1 drop into the right eye 2 (two) times daily.   Yes [provider]  dorzolamide-timolol (COSOPT) 22.3-6.8 MG/ML ophthalmic solution Place 1 drop into the right eye 2 (two) times daily.   Yes [provider]  Doxercalciferol (HECTOROL IV) Dialysis days Monday,Tuesday,Thursday and saturday 04/26/21 04/25/22 Yes [provider]  heparin 1000 unit/mL SOLN injection Heparin Sodium (Porcine) 1,000 Units/mL Catheter Lock Arterial 08/21/20 08/20/21 Yes [provider]  Methoxy PEG-Epoetin Beta (MIRCERA IJ) Dialysis days Monday,Tuesday,Thursday and saturday 12/16/20 04/20/22 Yes [provider]  midodrine (PROAMATINE) 10 MG tablet Take 1 tablet (10 mg total) by mouth as directed. Twice a day on the day of HD Tuesday, Thursday, Saturday (1 in AM and 1 at [place]). Take 1/2 tablet (5mg ) a day on non HD days 04/22/19  Yes Leonie Man, MD  montelukast (SINGULAIR) 10 MG tablet TAKE 1 TABLET(10 MG) BY MOUTH AT BEDTIME Patient taking differently: Take 10 mg by mouth at bedtime. 05/06/21  Yes [name], NP  multivitamin (RENA-VIT) TABS tablet Take 1 tablet by mouth every evening. 05/26/20  Yes [provider]  ofloxacin (OCUFLOX) 0.3 % ophthalmic solution Place 1 drop into the right eye 4 (four) times daily. 02/22/21  Yes [name], NP  oxyCODONE (OXY IR/ROXICODONE) 5 MG immediate release tablet Take 1 tablet (5 mg total) by mouth every 6 (six) hours as needed for moderate pain or breakthrough pain. 06/15/21  Yes Charlynne Cousins, MD  pantoprazole (PROTONIX) 40 MG tablet TAKE 1 TABLET(40 MG) BY MOUTH DAILY Patient taking differently: Take 40 mg by mouth daily. 05/17/21  Yes [name], NP  sucroferric oxyhydroxide (VELPHORO) 500 MG chewable tablet Chew 500 mg by mouth 3 (three) times daily as needed (with meals).   Yes  [provider]   Current Facility-Administered Medications  Medication Dose Route Frequency Provider Last Rate Last Admin   acetaminophen (TYLENOL) tablet 650 mg  650 mg Oral Q6H PRN Irene Pap N, DO       allopurinol (ZYLOPRIM) tablet 100 mg  100 mg Oral Once per day on Mon Tue Thu Sat Irene Pap N, DO       amiodarone (PACERONE) tablet 200 mg  200 mg Oral Daily Irene Pap N, DO       apixaban (ELIQUIS) tablet 5 mg  5 mg Oral BID Hall, Carole N, DO       bictegravir-emtricitabine-tenofovir AF ([name]) 50-200-25 MG per tablet 1 tablet  1 tablet Oral Daily Hall, Carole N, DO       brimonidine (ALPHAGAN) 0.15 % ophthalmic solution 1 drop  1 drop Right Eye BID Hall, Carole N, DO       Chlorhexidine Gluconate Cloth 2 % PADS 6 each  6 each Topical Q0600 Loren Racer, PA-C       midodrine (PROAMATINE) tablet 10 mg  10 mg Oral 2 times per day on Tue Thu Sat Kayleen Memos, [place] ON 06/22/2021] midodrine (PROAMATINE) tablet 5 mg  5 mg Oral Once per day on Sun Mon Wed Fri WHQP, Carole N, DO       montelukast (SINGULAIR) tablet 10 mg  10 mg Oral QHS Irene Pap N, DO       multivitamin (RENA-VIT) tablet 1 tablet  1 tablet Oral QPM Hall, Carole N, DO       ofloxacin (OCUFLOX) 0.3 % ophthalmic solution 1 drop  1 drop Right Eye QID Hall, Carole N, DO       oxyCODONE (Oxy IR/ROXICODONE) immediate release tablet 5 mg  5 mg Oral Q6H PRN Hall, Carole N, DO       pantoprazole (PROTONIX) EC tablet 40 mg  40 mg Oral Daily Hall, Carole N, DO       polyethylene glycol (MIRALAX / GLYCOLAX) packet 17 g  17 g Oral Daily PRN Irene Pap N, DO       prochlorperazine (COMPAZINE) injection 5 mg  5 mg Intravenous Q6H PRN Irene Pap N, DO       sodium zirconium cyclosilicate (LOKELMA) packet 10 g  10 g Oral Once Farn[place], Carole N, DO       sucroferric oxyhydroxide (VELPHORO) chewable tablet 500 mg  500 mg Oral TID with meals Kayleen Memos, DO       Current Outpatient Medications   Medication Sig Dispense Refill   acetaminophen (TYLENOL) 500 MG tablet Take 1,000 mg by mouth every 6 (six) hours as needed for moderate pain.     allopurinol (ZYLOPRIM) 100 MG tablet TAKE 1 TABLET BY MOUTH 4 TIMES WEEKLY( MONDAY, TUESDAY,THURDSAY AND SATURDAY) AFTER DIALYSIS (Patient taking differently: Take 100 mg by mouth See admin instructions. TAKE 1 TABLET BY MOUTH 4 TIMES WEEKLY( MONDAY, TUESDAY,THURDSAY AND SATURDAY) AFTER DIALYSIS) 45 tablet 5   amiodarone (PACERONE) 200 MG tablet TAKE 1 TABLET(200 MG) BY MOUTH DAILY (Patient taking differently: Take 200 mg by mouth daily.) 90 tablet 3   apixaban (ELIQUIS) 5 MG TABS tablet TAKE 1 TABLET(5 MG) BY MOUTH TWICE DAILY (Patient taking differently: Take 5 mg by mouth 2 (two) times daily.) 180 tablet 1   [name] 50-200-25 MG TABS tablet TAKE 1 TABLET BY MOUTH DAILY 30 tablet 5   brimonidine (ALPHAGAN P) 0.1 % SOLN Place 1 drop into the right eye 2 (two) times daily.     dorzolamide-timolol (COSOPT) 22.3-6.8 MG/ML ophthalmic solution Place 1 drop into the right eye 2 (two) times daily.     Doxercalciferol (HECTOROL IV) Dialysis days Monday,Tuesday,Thursday and saturday     heparin  1000 unit/mL SOLN injection Heparin Sodium (Porcine) 1,000 Units/mL Catheter Lock Arterial     Methoxy PEG-Epoetin Beta (MIRCERA IJ) Dialysis days Monday,Tuesday,Thursday and saturday     midodrine (PROAMATINE) 10 MG tablet Take 1 tablet (10 mg total) by mouth as directed. Twice a day on the day of HD Tuesday, Thursday, Saturday (1 in AM and 1 at [place]). Take 1/2 tablet (5mg ) a day on non HD days 90 tablet 5   montelukast (SINGULAIR) 10 MG tablet TAKE 1 TABLET(10 MG) BY MOUTH AT BEDTIME (Patient taking differently: Take 10 mg by mouth at bedtime.) 30 tablet 5   multivitamin (RENA-VIT) TABS tablet Take 1 tablet by mouth every evening.     ofloxacin (OCUFLOX) 0.3 % ophthalmic solution Place 1 drop into the right eye 4 (four) times daily. 5 mL    oxyCODONE (OXY  IR/ROXICODONE) 5 MG immediate release tablet Take 1 tablet (5 mg total) by mouth every 6 (six) hours as needed for moderate pain or breakthrough pain. 10 tablet 0   pantoprazole (PROTONIX) 40 MG tablet TAKE 1 TABLET(40 MG) BY MOUTH DAILY (Patient taking differently: Take 40 mg by mouth daily.) 30 tablet 0   sucroferric oxyhydroxide (VELPHORO) 500 MG chewable tablet Chew 500 mg by mouth 3 (three) times daily as needed (with meals).     Labs: Basic Metabolic Panel: Recent Labs  Lab 06/16/21 0747 06/19/2021 1700 06/10/2021 2105 06/21/21 0814  NA 135 137  --  137  K 4.3 5.4*  --  5.7*  CL 96* 93*  --  94*  CO2 24 27  --  28  GLUCOSE 127* 114*  --  99  BUN 60* 56*  --  75*  CREATININE 9.50* 9.26* 9.68* 10.68*  CALCIUM 8.9 8.9  --  8.9  PHOS 6.9* 8.5*  --  10.3*   Liver Function Tests: Recent Labs  Lab 06/16/21 0747 06/15/2021 1700 06/21/21 0814  AST  --  29 24  ALT  --  7 6  ALKPHOS  --  123 105  BILITOT  --  1.1 1.1  PROT  --  7.0 6.4*  ALBUMIN 3.3* 3.2* 2.9*   Recent Labs  Lab 06/12/2021 1700  AMMONIA 31   CBC: Recent Labs  Lab 06/15/21 0156 06/16/21 0747 06/16/2021 1700 06/16/2021 2105 06/21/21 0814  WBC 6.7 6.1 9.3 9.7 10.3  NEUTROABS  --   --  7.4  --  7.9*  HGB 9.6* 10.4* 10.7* 10.8* 9.8*  HCT 29.6* 31.8* 32.9* 33.4* 29.6*  MCV 108.8* 107.8* 108.2* 110.2* 108.4*  PLT 162 189 281 260 260   CBG: Recent Labs  Lab 06/10/2021 1529 06/03/2021 2212 06/21/21 0831  GLUCAP 116* 111* 110*   Studies/Results: CT Head Wo Contrast  Result Date: 06/17/2021 CLINICAL DATA:  Altered mental status EXAM: CT HEAD WITHOUT CONTRAST TECHNIQUE: Contiguous axial images were obtained from the base of the skull through the vertex without intravenous contrast. COMPARISON:  09/05/2019 FINDINGS: Brain: No evidence of acute infarction, hemorrhage, hydrocephalus, extra-axial collection or mass lesion/mass effect. Mild-moderate low-density changes within the periventricular and subcortical white  matter compatible with chronic microvascular ischemic change. Mild diffuse cerebral volume loss with stable size and configuration of the ventricles. Vascular: No hyperdense vessel or unexpected calcification. Skull: Normal. Negative for fracture or focal lesion. Sinuses/Orbits: No acute finding. Other: None. IMPRESSION: 1. No acute intracranial findings. 2. Chronic microvascular ischemic change and cerebral volume loss. Electronically Signed   By: Davina Poke D.O.   On: 06/27/2021 17:04  MR Brain Wo Contrast (neuro protocol)  Result Date: 06/19/2021 CLINICAL DATA:  Encephalopathy EXAM: MRI HEAD WITHOUT CONTRAST TECHNIQUE: Multiplanar, multiecho pulse sequences of the brain and surrounding structures were obtained without intravenous contrast. COMPARISON:  01/07/2019 FINDINGS: Brain: No acute infarct, mass effect or extra-axial collection. No acute or chronic hemorrhage. Hyperintense T2-weighted signal is moderately widespread throughout the white matter. Generalized volume loss without a clear lobar predilection. The midline structures are normal. Vascular: Major flow voids are preserved. Skull and upper cervical spine: Normal calvarium and skull base. Visualized upper cervical spine and soft tissues are normal. Sinuses/Orbits:No paranasal sinus fluid levels or advanced mucosal thickening. No mastoid or middle ear effusion. Normal orbits. IMPRESSION: 1. No acute intracranial abnormality. 2. Generalized volume loss and findings of chronic microvascular ischemia. Electronically Signed   By: Ulyses Jarred M.D.   On: 06/05/2021 23:20   DG Chest Portable 1 View  Result Date: 06/05/2021 CLINICAL DATA:  Recent right hip fracture, lethargy, nausea EXAM: PORTABLE CHEST 1 VIEW COMPARISON:  06/10/2021 FINDINGS: Single frontal view of the chest demonstrates an enlarged cardiac silhouette. There is chronic central vascular congestion without airspace disease, effusion, or pneumothorax. No acute bony abnormalities.  IMPRESSION: 1. Stable enlarged cardiac silhouette and vascular congestion. No acute airspace disease. Electronically Signed   By: Randa Ngo M.D.   On: 06/15/2021 16:37    Dialysis Orders:  MTTS at [facility] 4hr, 400/A1.5, EDW 82kg, 2K/2Ca, UFP #2, AVG, heparin 2000 bolus - Hectoral 80mcg IV q HD - Venofer 50mg  IV weekly - Mircera 64mcg IV q 2 weeks  Assessment/Plan:  Lethargy/weakness: Ammonia ok, CT/MRI without acute findings. No clear etiology at the moment.  ESRD:  Will plan to dialyze today. Mild hyperK should correct with HD. If any component of over-medication contributing to current state, hopefully will slightly improve with dialysis.  Hypotension/volume: Chronically low BP (on midodrine) and with ^ BNP - UF as tolerated.  Anemia: Hgb 9.8 - follow Hgb today, likely re-dose ESA later this week.  Metabolic bone disease: Ca ok, Phos very high - needs to resume home binders once able to eat.  Nutrition:  Alb very low, to add supplements once eating.  HFrEF: Repeat echo pending.  HIV: Continue home meds.  A-fib: On Eliquis  Pollyann Kennedy 06/21/2021, 11:37 AM  [facility]  I have seen and examined this patient and agree with plan and assessment in the above note with renal recommendations/intervention highlighted. Pt is very lethargic and not able to answer simple questions.  May be related to pain meds.  Consider low dose narcan to see if he wakes up if HD doesn't improve his mentation.  [name] Gale Klar,MD 06/21/2021 2:46 PM

## 2021-06-21 NOTE — Progress Notes (Signed)
Notified MD of elevated temp and received new orders.

## 2021-06-21 NOTE — Progress Notes (Addendum)
ANTICOAGULATION CONSULT NOTE - Initial Consult  Pharmacy Consult for IV Heparin Indication: atrial fibrillation  Allergies  Allergen Reactions   Dextromethorphan-Guaifenesin Other (See Comments)    Unknown reaction   Tocotrienols Other (See Comments)    Unknown reaction   Losartan Potassium Other (See Comments)    Causes constipation    Patient Measurements: Height: 6\' 2"  (188 cm) Weight: 80.6 kg (177 lb 11.1 oz) IBW/kg (Calculated) : 82.2 Heparin Dosing Weight: 80.6 kg  Vital Signs: Temp: 100.8 F (38.2 C) (07/19 1940) Temp Source: Oral (07/19 1940) BP: 130/73 (07/19 1940) Pulse Rate: 87 (07/19 1940)  Labs: Recent Labs    06/03/2021 1700 06/17/2021 2105 06/21/21 0814 06/21/21 1136 06/21/21 1742 06/21/21 1843  HGB 10.7* 10.8* 9.8* 10.5*  --  12.1*  HCT 32.9* 33.4* 29.6* 31.0*  --  37.8*  PLT 281 260 260  --   --  214  APTT 34  --   --   --   --   --   LABPROT 21.3*  --   --   --   --   --   INR 1.8*  --   --   --   --   --   CREATININE 9.26* 9.68* 10.68*  --  7.23*  --   TROPONINIHS 106*  --   --   --  127* 128*    Estimated Creatinine Clearance: 9.4 mL/min (A) (by C-G formula based on SCr of 7.23 mg/dL (H)).   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   Acute on chronic systolic and diastolic heart failure, NYHA class 4 (HCC)    Anemia, iron deficiency 11/15/2011   Arthritis    "hands, right knee, feet" (02/21/2018)   Atrial fibrillation (HCC)    Cardiac arrest ([place]) 02/17/2018   CHF (congestive heart failure) (HCC)    Chronic combined systolic and diastolic heart failure, NYHA class 3 ([place]) 06/2010   felt to be secondary to hypertensive cardiomyopathy   Chronic lower back pain    CKD (chronic kidney disease) stage V requiring chronic dialysis ([place])    COVID-19 12/2019   ESRD on dialysis [facility])    started 02/2018 [place]   GERD (gastroesophageal reflux disease)    Glaucoma     "I had surgery and dont have it any more"   Gout    daily RX (02/21/2018)   Heart murmur    "mild" per pt   Hepatitis    years ago   HIV (human immunodeficiency virus infection) ([place])    HIV infection ([place])    Hyperlipidemia    Hypertension    Nonischemic cardiomyopathy ([place])    Pneumonia 11/2017   Prostate cancer ([place])    hx of prostate; s/p radioactive seed implant 10/2009 Dr Janice Norrie   Sinus bradycardia    Sleep apnea    does not use a cpap   Stroke [facility])    "mini stroke" years ago   Wears glasses     Assessment: 80 year old man with ESRD (TTS HD) admitted on 06/29/2021. Pt has hx of atrial fibrillation, for which he was on apixaban 5 mg PO BID PTA (last dose at 0800 on 7/18, per med rec; pt has not rec'd any doses of apixaban since admission, unable to sit up and swallow pills). Pharmacy was consulted to dose IV heparin for atrial fibrillation. Given recent apixaban exposure, will monitor anticoagulation using aPTT until aPTT and heparin levels  correlate.  H/H 12.1/37.8, plt 214; INR 1.8, aPTT 34 sec  Goal of Therapy:  Heparin level 0.3-0.7 units/ml Monitor platelets by anticoagulation protocol: Yes   Plan:  Heparin infusion at 1150 units/hr (no bolus) Check aPTT, heparin level in 8 hrs Monitor daily aPTT, heparin level, CBC Monitor for bleeding  Gillermina Hu, PharmD, BCPS, [facility] Clinical Pharmacist 06/21/2021,9:24 PM

## 2021-06-21 NOTE — Progress Notes (Signed)
Patient is unavailable for EEG.  He is OTF.  Will reattempt when he is available.

## 2021-06-21 NOTE — Progress Notes (Signed)
Spoke with patient concerning wearing CPAP for night rest.  Patient states he wears it sometimes.  I advised patient I will return later and set him up for night rest.

## 2021-06-21 NOTE — Progress Notes (Signed)
Visit made to room set patient up on CPAP auto titrate with full face mask per home regimen.  Pt tolerating well.

## 2021-06-21 NOTE — ED Notes (Signed)
Pt is easy to arouse but falls back to sleep after waking up. Pt opens his eyes and moves his head and arousable to touch. Unable to hold conversation with pt at this time. PO home meds not administered at this time. Will continue to monitor. Stable VS.

## 2021-06-21 NOTE — Progress Notes (Signed)
MD at bedside to assess pt, awaiting further orders.

## 2021-06-21 NOTE — Progress Notes (Signed)
PT Cancellation Note  Patient Details Name: David [name] MRN: [id number] DOB: 1941-08-30   Cancelled Treatment:    Reason Eval/Treat Not Completed: Other (comment).  Pt was with nephew when PT arrived, unable to awaken to work with PT.  Nephew reports pt has been lethargic since before hosp transition to SNF after R hip fracture, and will work with him as his condition allows.  Has PWB on RLE and has not walked since last hosp day recently, but anticipating he is returning to rehab setting to complete therapy next.   [name] 06/21/2021, 12:30 PM  Mee Hives, PT MS Acute Rehab Dept. Number: [place] [facility] [place]

## 2021-06-21 NOTE — Progress Notes (Signed)
Pt continues to be lethargic and unable to swallow safely at this time. ST has been consulted but has been unable to see yet. Notified MD on call HS meds were held. New orders received for IV heparin.

## 2021-06-21 NOTE — ED Notes (Signed)
Spoke to [employer] hemodialysis nurse who states pt will be taken to dialysis soon.

## 2021-06-21 NOTE — Procedures (Signed)
I was present at this dialysis session. I have reviewed the session itself and made appropriate changes.   Vital signs in last 24 hours:  Temp:  [98 F (36.7 C)-99.2 F (37.3 C)] 98 F (36.7 C) (07/19 1242) Pulse Rate:  [80-96] 83 (07/19 1430) Resp:  [12-30] 16 (07/19 1358) BP: (112-142)/(63-76) 131/67 (07/19 1430) SpO2:  [94 %-100 %] 95 % (07/19 1330) Weight:  [80.6 kg] 80.6 kg (07/18 1530) Weight change:  Filed Weights   06/19/2021 1530  Weight: 80.6 kg    Recent Labs  Lab 06/21/21 0814 06/21/21 1136  NA 137 137  K 5.7* 5.4*  CL 94*  --   CO2 28  --   GLUCOSE 99  --   BUN 75*  --   CREATININE 10.68*  --   CALCIUM 8.9  --   PHOS 10.3*  --     Recent Labs  Lab 06/29/2021 1700 06/19/2021 2105 06/21/21 0814 06/21/21 1136  WBC 9.3 9.7 10.3  --   NEUTROABS 7.4  --  7.9*  --   HGB 10.7* 10.8* 9.8* 10.5*  HCT 32.9* 33.4* 29.6* 31.0*  MCV 108.2* 110.2* 108.4*  --   PLT 281 260 260  --     Scheduled Meds:  allopurinol  100 mg Oral Once per day on Mon Tue Thu Sat   amiodarone  200 mg Oral Daily   apixaban  5 mg Oral BID   bictegravir-emtricitabine-tenofovir AF  1 tablet Oral Daily   brimonidine  1 drop Right Eye BID   Chlorhexidine Gluconate Cloth  6 each Topical Q0600   midodrine  10 mg Oral 2 times per day on Tue Thu Sat   [START ON 06/22/2021] midodrine  5 mg Oral Once per day on Sun Mon Wed Fri   montelukast  10 mg Oral QHS   multivitamin  1 tablet Oral QPM   ofloxacin  1 drop Right Eye QID   pantoprazole  40 mg Oral Daily   sodium zirconium cyclosilicate  10 g Oral Once   sucroferric oxyhydroxide  500 mg Oral TID with meals   Continuous Infusions: PRN Meds:.acetaminophen, oxyCODONE, polyethylene glycol, prochlorperazine   [name],  MD 06/21/2021, 2:38 PM

## 2021-06-21 NOTE — Progress Notes (Signed)
OT Cancellation Note  Patient Details Name: David [name] MRN: [id number] DOB: Nov 20, 1941   Cancelled Treatment:    Reason Eval/Treat Not Completed: Patient at procedure or test/ unavailable;Fatigue/lethargy limiting ability to participate. Pt from [facility] where he was participating in rehab s/p R hip fx. Per chart review, pt PWB RLE as of 06/16/21. Pt lethargic and being prepared to transfer to HD. Will assess later time.    Corrisa Gibby,HILLARY 06/21/2021, 12:20 PM Maurie Boettcher, OT/L   Acute OT Clinical Specialist Acute Rehabilitation Services Pager [phone] Office [phone]

## 2021-06-21 NOTE — Progress Notes (Signed)
Called by RN. Pt with temperature to 100.8 F. Is lethargic.  Given tylenol suppository for fever.  CBC earlier this evening showed elevated WBC. Check CXR, UA, lactic acid. Obtain blood cultures.  Start rocephin.

## 2021-06-21 NOTE — Consult Note (Signed)
Cardiology Consultation:   Patient ID: David [name] MRN: [id number]; DOB: 29-Apr-1941  Admit date: 06/23/2021 Date of Consult: 06/21/2021  PCP:  [name], NP   [facility] Providers Cardiologist:  [name], MD        Patient Profile:   David [name] is a 80 y.o. male with a hx of CHF combined systolic and diastolic, NICM, LBBB PAF, hx of PEA arrest, chronic HIV with undectable viral load and ESRD on HD, who is being seen 06/21/2021 for the evaluation of atrial fib at the request of Dr. Alfredia Ferguson. .  History of Present Illness:   David [name]  with above hx and recent hip fx with pinning and held eliquis for 2- days last dose 06/10/21 and resumed 06/13/21 . Today he presented to ER after hemodialysis due to generalized weakness and nausea.MRI head without acute finding. BNP >4000 troponin 106.  HD M,T,Th,Sat   EKG:  The EKG was personally reviewed and demonstrates:  06/27/2021 was in SR LBBB and then today into a fib with RVR at 135 and LBBB.  Telemetry:  Telemetry was personally reviewed and demonstrates:  atrial fib   He was dialyzed this afternoon  has been lethargic. 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   Acute on chronic systolic and diastolic heart failure, NYHA class 4 (HCC)    Anemia, iron deficiency 11/15/2011   Arthritis    "hands, right knee, feet" (02/21/2018)   Atrial fibrillation (HCC)    Cardiac arrest ([place]) 02/17/2018   CHF (congestive heart failure) (HCC)    Chronic combined systolic and diastolic heart failure, NYHA class 3 ([place]) 06/2010   felt to be secondary to hypertensive cardiomyopathy   Chronic lower back pain    CKD (chronic kidney disease) stage V requiring chronic dialysis ([place])    COVID-19 12/2019   ESRD on dialysis [facility])    started 02/2018 [place]   GERD (gastroesophageal reflux disease)     Glaucoma    "I had surgery and dont have it any more"   Gout    daily RX (02/21/2018)   Heart murmur    "mild" per pt   Hepatitis    years ago   HIV (human immunodeficiency virus infection) ([place])    HIV infection ([place])    Hyperlipidemia    Hypertension    Nonischemic cardiomyopathy ([place])    Pneumonia 11/2017   Prostate cancer ([place])    hx of prostate; s/p radioactive seed implant 10/2009 Dr Janice Norrie   Sinus bradycardia    Sleep apnea    does not use a cpap   Stroke [facility])    "mini stroke" years ago   Wears glasses     Past Surgical History:  Procedure Laterality Date   A/V FISTULAGRAM Bilateral 07/30/2020   Procedure: CENTRAL VENOGRAM;  Surgeon: Angelia Mould, MD;  Location: [place] CV LAB;  Service: Cardiovascular;  Laterality: Bilateral;  bilateral upper venogram    AV FISTULA PLACEMENT Right 10/13/2016   Procedure: ARTERIOVENOUS (AV) FISTULA CREATION;  Surgeon: [name], MD;  Location: [place];  Service: Vascular;  Laterality: Right;   AV FISTULA PLACEMENT Left 02/25/2018   Procedure: INSERTION OF ARTERIOVENOUS (AV) GORE-TEX GRAFT LEFT UPPER ARM;  Surgeon: Angelia Mould, MD;  Location: [place];  Service: Vascular;  Laterality: Left;   AV  FISTULA PLACEMENT Right 08/07/2018   Procedure: Creation of right arm brachiocephalic Fistula;  Surgeon: Waynetta Sandy, MD;  Location: [place];  Service: Vascular;  Laterality: Right;   AV FISTULA PLACEMENT Left 11/10/2019   Procedure: INSERTION OF ARTERIOVENOUS (AV) GORE-TEX GRAFT THIGH;  Surgeon: Elam Dutch, MD;  Location: [place];  Service: Vascular;  Laterality: Left;   AV FISTULA PLACEMENT Right 12/02/2019   Procedure: INSERTION OF ARTERIOVENOUS (AV) GORE-TEX GRAFT RIGHT  THIGH;  Surgeon: Waynetta Sandy, MD;  Location: [place];  Service: Vascular;  Laterality: Right;   AV FISTULA PLACEMENT Right 08/13/2020   Procedure: INSERTION OF RIGHT ARTERIOVENOUS (AV) GORE-TEX GRAFT ARM;  Surgeon: Angelia Mould, MD;  Location: [place];  Service: Vascular;  Laterality: Right;   [place] Left 06/24/2015   Procedure: BASILIC VEIN TRANSPOSITION;  Surgeon: Mal Misty, MD;  Location: [place];  Service: Vascular;  Laterality: Left;   BIOPSY  03/14/2019   Procedure: BIOPSY;  Surgeon: Irene Shipper, MD;  Location: [place];  Service: Endoscopy;;   CATARACT EXTRACTION, BILATERAL Bilateral    COLONOSCOPY WITH PROPOFOL N/A 03/14/2019   Procedure: COLONOSCOPY WITH PROPOFOL;  Surgeon: Irene Shipper, MD;  Location: [place];  Service: Endoscopy;  Laterality: N/A;   ESOPHAGOGASTRODUODENOSCOPY (EGD) WITH PROPOFOL N/A 05/05/2014   Procedure: ESOPHAGOGASTRODUODENOSCOPY (EGD) WITH PROPOFOL;  Surgeon: Missy Sabins, MD;  Location: [facility];  Service: Endoscopy;  Laterality: N/A;   EYE SURGERY Bilateral    cataract surgery    GLAUCOMA SURGERY Bilateral    HIP PINNING,CANNULATED Right 06/12/2021   Procedure: CANNULATED RIGHT HIP PINNING;  Surgeon: Jessy Oto, MD;  Location: [place];  Service: Orthopedics;  Laterality: Right;   INSERTION OF ARTERIOVENOUS (AV) ARTEGRAFT ARM  10/09/2018   Procedure: INSERTION OF ARTERIOVENOUS (AV) 43mm x 41cm ARTEGRAFT LEFT UPPER ARM;  Surgeon: Waynetta [place], MD;  Location: [place];  Service: Vascular;;   INSERTION OF DIALYSIS CATHETER Right 07/14/2019   Procedure: Insertion Of Dialysis Catheter;  Surgeon: Elam Dutch, MD;  Location: [facility];  Service: Vascular;  Laterality: Right;  placed right Internal Jugular   INSERTION OF DIALYSIS CATHETER Left 08/13/2020   Procedure: INSERTION OF DIALYSIS CATHETER;  Surgeon: Angelia Mould, MD;  Location: [place];  Service: Vascular;  Laterality: Left;   INSERTION PROSTATE RADIATION SEED     IR FLUORO GUIDE CV LINE RIGHT  02/18/2018   IR REMOVAL TUN CV CATH W/O FL  12/06/2018   IR THROMBECTOMY AV FISTULA W/THROMBOLYSIS INC/SHUNT/IMG RIGHT  11/12/2020   IR THROMBECTOMY AV FISTULA W/THROMBOLYSIS/PTA  INC/SHUNT/IMG RIGHT Right 01/26/2020   IR THROMBECTOMY AV FISTULA W/THROMBOLYSIS/PTA INC/SHUNT/IMG RIGHT Right 02/24/2020   IR THROMBECTOMY AV FISTULA W/THROMBOLYSIS/PTA INC/SHUNT/IMG RIGHT Right 04/07/2020   IR US GUIDE VASC ACCESS RIGHT  02/18/2018   IR US GUIDE VASC ACCESS RIGHT  01/26/2020   IR US GUIDE VASC ACCESS RIGHT  04/07/2020   IR US GUIDE VASC ACCESS RIGHT  11/12/2020   LEFT HEART CATH AND CORONARY ANGIOGRAPHY N/A 02/20/2018   Procedure: LEFT HEART CATH AND CORONARY ANGIOGRAPHY;  Surgeon: Jettie Booze, MD;  [facility];;; 50% ostial OM2, 25% mLAD.   LEFT HEART CATH AND CORONARY ANGIOGRAPHY  2004   mildly depressed LV systolic fx EF 84%,ONGEXB coronaries/abdominal aorta/renal arteries.   LOWER EXTREMITY ANGIOGRAM Left 11/17/2019   Procedure: Lower Extremity Fistulogram;  Surgeon: Marty Heck, MD;  Location: [place];  Service: Vascular;  Laterality:  Left;   NM MYOCAR PERF WALL MOTION  02/21/2010   No ischemia or infarction.  EF 27%.   RADIOACTIVE SEED IMPLANT  2010   prostate cancer   REMOVAL OF A DIALYSIS CATHETER Right 08/13/2020   Procedure: REMOVAL OF A DIALYSIS CATHETER;  Surgeon: Angelia Mould, MD;  Location: [place];  Service: Vascular;  Laterality: Right;   REVISION OF ARTERIOVENOUS GORETEX GRAFT Right 07/11/2019   Procedure: REVISION OF ARTERIOVENOUS GORETEX GRAFT RIGHT ARM WITH ARTEGRAFT;  Surgeon: Serafina [place], MD;  Location: [place];  Service: Vascular;  Laterality: Right;   SHUNTOGRAM Right 07/14/2019   Procedure: [place];  Surgeon: Elam Dutch, MD;  Location: [place];  Service: Vascular;  Laterality: Right;   THROMBECTOMY AND REVISION OF ARTERIOVENTOUS (AV) GORETEX  GRAFT Right 07/14/2019   Procedure: THROMBECTOMY AND REVISION OF ARTERIOVENTOUS (AV) GORETEX  GRAFT;  Surgeon: Elam Dutch, MD;  Location: [place];  Service: Vascular;  Laterality: Right;   THROMBECTOMY W/ EMBOLECTOMY Left 02/26/2018   Procedure: THROMBECTOMY  ARTERIOVENOUS GRAFT;  Surgeon: Angelia Mould, MD;  Location: [place];  Service: Vascular;  Laterality: Left;   THROMBECTOMY W/ EMBOLECTOMY Left 11/17/2019   Procedure: THROMBECTOMY ARTERIOVENOUS GRAFT LEFT THIGH;  Surgeon: Marty Heck, MD;  Location: [place];  Service: Vascular;  Laterality: Left;   TRANSTHORACIC ECHOCARDIOGRAM  02/2012   ([place]) EF 45-50% with mild global HK.  Mild LVH,LA mod. dilated,mild-mod. 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 AI.   TRANSTHORACIC ECHOCARDIOGRAM  9/'18; 3/'19   a) Severely reduced EF.  25 from 30%.  GR 1 DD with diffuse ecchymosis. Mild AS & AI.  Mild LA/RA dilation; b) (post PEA arest):   Moderately dilated LV with moderate concentric virtually.  EF 15 to 20%.  GR 1 DD.  Paradoxical septal motion. Mild AI.  Mild LA dilation.  Poorly visualized RV.   TRANSTHORACIC ECHOCARDIOGRAM  02/2019   EF 20-25%.  Unable to assess diastolic function (A. Fib).  No LV apical thrombus.  Mild AI.  Mildly reduced RV function, however poorly visualized.   ULTRASOUND GUIDANCE FOR VASCULAR ACCESS  02/20/2018   Procedure: Ultrasound Guidance For Vascular Access;  Surgeon: Jettie Booze, MD;  Location: [place] CV LAB;  Service: Cardiovascular;;   UPPER EXTREMITY VENOGRAPHY N/A 07/08/2018   Procedure: UPPER EXTREMITY VENOGRAPHY;  Surgeon: Waynetta [place], MD;  Location: [place] CV LAB;  Service: Cardiovascular;  Laterality: N/A;  Bilateral       Inpatient Medications: Scheduled Meds:  allopurinol  100 mg Oral Once per day on Mon Tue Thu Sat   apixaban  5 mg Oral BID   bictegravir-emtricitabine-tenofovir AF  1 tablet Oral Daily   brimonidine  1 drop Right Eye BID   Chlorhexidine Gluconate Cloth  6 each Topical Q0600   midodrine  10 mg Oral 2 times per day on Tue Thu Sat   [START ON 06/22/2021] midodrine  5 mg Oral Once per day on Sun Mon Wed Fri   montelukast  10 mg Oral QHS   multivitamin  1 tablet Oral QPM    ofloxacin  1 drop Right Eye QID   pantoprazole  40 mg Oral Daily   sodium zirconium cyclosilicate  10 g Oral Once   sucroferric oxyhydroxide  500 mg Oral TID with meals   Continuous Infusions:  amiodarone 60 mg/hr (06/21/21 1739)   Followed by   Derrill Memo ON 06/22/2021] amiodarone     PRN Meds: acetaminophen,  oxyCODONE, polyethylene glycol, prochlorperazine  Allergies:    Allergies  Allergen Reactions   Dextromethorphan-Guaifenesin Other (See Comments)    Unknown reaction   Tocotrienols Other (See Comments)    Unknown reaction   Losartan Potassium Other (See Comments)    Causes constipation    Social History:   Social History   Socioeconomic History   Marital status: Married    Spouse name: Not on file   Number of children: 2   Years of education: 12   Highest education level: Not on file  Occupational History   Occupation: retired, picks up Aeronautical engineer: RETIRED  Tobacco Use   Smoking status: Former    Packs/day: 1.00    Years: 30.00    Pack years: 30.00    Types: Cigarettes    Quit date: 2006    Years since quitting: 16.5   Smokeless tobacco: Never  Vaping Use   Vaping Use: Never used  Substance and Sexual Activity   Alcohol use: Yes    Alcohol/week: 1.0 standard drink    Types: 1 Shots of liquor per week    Comment: occasional   Drug use: Never   Sexual activity: Not Currently    Partners: Female    Comment: declined condoms 09/2019  Other Topics Concern   Not on file  Social History Narrative   Retired, 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   Social Determinants of Health   Financial Resource Strain: Low Risk    Difficulty of Paying Living Expenses: Not hard at all  Food Insecurity: No Food Insecurity   Worried About Charity fundraiser in the Last Year: Never true   [place]  in the Last Year: Never true  Transportation Needs: No Transportation Needs   Lack of Transportation (Medical): No   Lack of Transportation (Non-Medical): No  Physical Activity: Not on file  Stress: Not on file  Social Connections: Not on file  Intimate Partner Violence: Not on file    Family History:    Family History  Problem Relation Age of Onset   Hypertension Mother    Thyroid disease Mother    Cholelithiasis Daughter    Cholelithiasis Son    Hypertension Maternal Grandmother    Diabetes Maternal Grandmother    Heart attack Neg Hx    Hyperlipidemia Neg Hx      ROS:  Please see the history of present illness.  General:no colds or fevers, no weight changes Skin:no rashes or ulcers HEENT:no blurred vision, no congestion CV:see HPI PUL:see HPI GI:no diarrhea constipation or melena, no indigestion GU:no hematuria, no dysuria MS:no joint pain, no claudication Neuro:no syncope, no lightheadedness Endo:no diabetes, no thyroid disease  All other ROS reviewed and negative.     Physical Exam/Data:   Vitals:   06/21/21 1716 06/21/21 1717 06/21/21 1731 06/21/21 1800  BP:    135/78  Pulse: (!) 27 (!) 38    Resp: 17 (!) 21  (!) 25  Temp:   98.7 F (37.1 C)   TempSrc:   Oral   SpO2: 100% 100%  100%  Weight:      Height:        Intake/Output Summary (Last 24 hours) at 06/21/2021  [place] filed at 06/21/2021 1608 Gross per 24 hour  Intake --  Output 2000 ml  Net -2000 ml   Last 3 Weights 07/03/2021 06/16/2021 06/16/2021  Weight (lbs) 177 lb 11.1 oz 177 lb 11.1 oz 182 lb 1.6 oz  Weight (kg) 80.6 kg 80.6 kg 82.6 kg     Body mass index is 22.81 kg/m.   PE per Dr. [place]   Relevant CV Studies: Echo 06/21/21 IMPRESSIONS     1. Left ventricular ejection fraction, by estimation, is 20 to 25%. The  left ventricle has severely decreased function. The left ventricle  demonstrates global hypokinesis. The left ventricular internal cavity size  was severely dilated.  Left ventricular  diastolic parameters are consistent with Grade II diastolic dysfunction  (pseudonormalization). Elevated left atrial pressure.   2. Right ventricular systolic function is mildly reduced. The right  ventricular size is severely enlarged. There is normal pulmonary artery  systolic pressure. The estimated right ventricular systolic pressure is  73.5 mmHg.   3. Left atrial size was moderately dilated.   4. Right atrial size was mildly dilated.   5. The mitral valve is normal in structure. Severe mitral valve  regurgitation.   6. The aortic valve is tricuspid. Aortic valve regurgitation is mild to  moderate. Mild to moderate aortic valve stenosis.   7. There is mild dilatation of the ascending aorta, measuring 42 mm.   8. The inferior vena cava is dilated in size with >50% respiratory  variability, suggesting right atrial pressure of 8 mmHg.   Comparison(s): Prior images reviewed side by side. The left ventricular  function is unchanged. The right ventricular systolic function is worse.  Mitral regurgitation appears more severe. There is systolic flow reversal  in the pulmonary veins. In a  hemodialysis patient, this may be due to different loading conditions.   FINDINGS   Left Ventricle: Left ventricular ejection fraction, by estimation, is 20  to 25%. The left ventricle has severely decreased function. The left  ventricle demonstrates global hypokinesis. The left ventricular internal  cavity size was severely dilated.  There is no left ventricular hypertrophy. Abnormal (paradoxical) septal  motion, consistent with left bundle branch block. Left ventricular  diastolic parameters are consistent with Grade II diastolic dysfunction  (pseudonormalization). Elevated left atrial   pressure.   Laboratory Data:  High Sensitivity Troponin:   Recent Labs  Lab 06/16/2021 1700  TROPONINIHS 106*     Chemistry Recent Labs  Lab 06/16/21 0747 06/29/2021 1700 06/09/2021 2105  06/21/21 0814 06/21/21 1136  NA 135 137  --  137 137  K 4.3 5.4*  --  5.7* 5.4*  CL 96* 93*  --  94*  --   CO2 24 27  --  28  --   GLUCOSE 127* 114*  --  99  --   BUN 60* 56*  --  75*  --   CREATININE 9.50* 9.26* 9.68* 10.68*  --   CALCIUM 8.9 8.9  --  8.9  --   GFRNONAA 5* 5* 5* 4*  --   ANIONGAP 15 17*  --  15  --     Recent Labs  Lab 06/16/21 0747 07/03/2021 1700 06/21/21 0814  PROT  --  7.0 6.4*  ALBUMIN 3.3* 3.2* 2.9*  AST  --  29 24  ALT  --  7 6  ALKPHOS  --  123 105  BILITOT  --  1.1 1.1   Hematology Recent Labs  Lab 06/08/2021 1700  06/19/2021 2105 06/21/21 0814 06/21/21 1136  WBC 9.3 9.7 10.3  --   RBC 3.04* 3.03* 2.73*  --   HGB 10.7* 10.8* 9.8* 10.5*  HCT 32.9* 33.4* 29.6* 31.0*  MCV 108.2* 110.2* 108.4*  --   MCH 35.2* 35.6* 35.9*  --   MCHC 32.5 32.3 33.1  --   RDW 15.3 15.6* 15.4  --   PLT 281 260 260  --    BNP Recent Labs  Lab 06/03/2021 1700  BNP 4,128.9*    DDimer No results for input(s): DDIMER in the last 168 hours.   Radiology/Studies:  CT Head Wo Contrast  Result Date: 06/29/2021 CLINICAL DATA:  Altered mental status EXAM: CT HEAD WITHOUT CONTRAST TECHNIQUE: Contiguous axial images were obtained from the base of the skull through the vertex without intravenous contrast. COMPARISON:  09/05/2019 FINDINGS: Brain: No evidence of acute infarction, hemorrhage, hydrocephalus, extra-axial collection or mass lesion/mass effect. Mild-moderate low-density changes within the periventricular and subcortical white matter compatible with chronic microvascular ischemic change. Mild diffuse cerebral volume loss with stable size and configuration of the ventricles. Vascular: No hyperdense vessel or unexpected calcification. Skull: Normal. Negative for fracture or focal lesion. Sinuses/Orbits: No acute finding. Other: None. IMPRESSION: 1. No acute intracranial findings. 2. Chronic microvascular ischemic change and cerebral volume loss. Electronically Signed   By:  Davina Poke D.O.   On: 06/18/2021 17:04   MR Brain Wo Contrast (neuro protocol)  Result Date: 06/09/2021 CLINICAL DATA:  Encephalopathy EXAM: MRI HEAD WITHOUT CONTRAST TECHNIQUE: Multiplanar, multiecho pulse sequences of the brain and surrounding structures were obtained without intravenous contrast. COMPARISON:  01/07/2019 FINDINGS: Brain: No acute infarct, mass effect or extra-axial collection. No acute or chronic hemorrhage. Hyperintense T2-weighted signal is moderately widespread throughout the white matter. Generalized volume loss without a clear lobar predilection. The midline structures are normal. Vascular: Major flow voids are preserved. Skull and upper cervical spine: Normal calvarium and skull base. Visualized upper cervical spine and soft tissues are normal. Sinuses/Orbits:No paranasal sinus fluid levels or advanced mucosal thickening. No mastoid or middle ear effusion. Normal orbits. IMPRESSION: 1. No acute intracranial abnormality. 2. Generalized volume loss and findings of chronic microvascular ischemia. Electronically Signed   By: Ulyses Jarred M.D.   On: 06/27/2021 23:20   DG Chest Portable 1 View  Result Date: 06/25/2021 CLINICAL DATA:  Recent right hip fracture, lethargy, nausea EXAM: PORTABLE CHEST 1 VIEW COMPARISON:  06/10/2021 FINDINGS: Single frontal view of the chest demonstrates an enlarged cardiac silhouette. There is chronic central vascular congestion without airspace disease, effusion, or pneumothorax. No acute bony abnormalities. IMPRESSION: 1. Stable enlarged cardiac silhouette and vascular congestion. No acute airspace disease. Electronically Signed   By: Randa Ngo M.D.   On: 06/09/2021 16:37   ECHOCARDIOGRAM COMPLETE  Result Date: 06/21/2021    ECHOCARDIOGRAM REPORT   Patient Name:   EBIN PALAZZI [facility] Date of Exam: 06/21/2021 Medical Rec #:  [id number]            Height:       74.0 in Accession #:    [id number]           Weight:       177.7 lb Date of Birth:   [birth year]            BSA:          2.067 m Patient Age:    72 years             BP:  126/66 mmHg Patient Gender: M                    HR:           89 bpm. Exam Location:  Inpatient Procedure: 2D Echo, Cardiac Doppler and Color Doppler Indications:    I50.23 Acute on chronic systolic (congestive) heart failure  History:        Patient has prior history of Echocardiogram examinations, most                 recent 12/01/2020. Cardiomyopathy, Stroke, Arrythmias:Atrial                 Fibrillation, Signs/Symptoms:Murmur; Risk Factors:Hypertension,                 Dyslipidemia and Sleep Apnea. ESRD. COVID-19. GERD.  Sonographer:    Jonelle Sidle Dance Referring Phys: [id number] [place]  1. Left ventricular ejection fraction, by estimation, is 20 to 25%. The left ventricle has severely decreased function. The left ventricle demonstrates global hypokinesis. The left ventricular internal cavity size was severely dilated. Left ventricular diastolic parameters are consistent with Grade II diastolic dysfunction (pseudonormalization). Elevated left atrial pressure.  2. Right ventricular systolic function is mildly reduced. The right ventricular size is severely enlarged. There is normal pulmonary artery systolic pressure. The estimated right ventricular systolic pressure is 35.0 mmHg.  3. Left atrial size was moderately dilated.  4. Right atrial size was mildly dilated.  5. The mitral valve is normal in structure. Severe mitral valve regurgitation.  6. The aortic valve is tricuspid. Aortic valve regurgitation is mild to moderate. Mild to moderate aortic valve stenosis.  7. There is mild dilatation of the ascending aorta, measuring 42 mm.  8. The inferior vena cava is dilated in size with >50% respiratory variability, suggesting right atrial pressure of 8 mmHg. Comparison(s): Prior images reviewed side by side. The left ventricular function is unchanged. The right ventricular systolic function is worse. Mitral  regurgitation appears more severe. There is systolic flow reversal in the pulmonary veins. In a hemodialysis patient, this may be due to different loading conditions. FINDINGS  Left Ventricle: Left ventricular ejection fraction, by estimation, is 20 to 25%. The left ventricle has severely decreased function. The left ventricle demonstrates global hypokinesis. The left ventricular internal cavity size was severely dilated. There is no left ventricular hypertrophy. Abnormal (paradoxical) septal motion, consistent with left bundle branch block. Left ventricular diastolic parameters are consistent with Grade II diastolic dysfunction (pseudonormalization). Elevated left atrial  pressure. Right Ventricle: The right ventricular size is severely enlarged. No increase in right ventricular wall thickness. Right ventricular systolic function is mildly reduced. There is normal pulmonary artery systolic pressure. The tricuspid regurgitant velocity is 2.44 m/s, and with an assumed right atrial pressure of 8 mmHg, the estimated right ventricular systolic pressure is 09.3 mmHg. Left Atrium: Left atrial size was moderately dilated. Right Atrium: Right atrial size was mildly dilated. Pericardium: Trivial pericardial effusion is present. 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 The mitral valve is normal in structure. Severe mitral valve regurgitation, with centrally-directed jet. Tricuspid Valve: The tricuspid valve is normal in structure. Tricuspid valve regurgitation is trivial. Aortic Valve: The aortic valve is tricuspid. Aortic valve regurgitation is mild to moderate. Aortic regurgitation PHT measures 359 msec. Mild to moderate aortic stenosis is present. Aortic valve mean gradient measures 11.0 mmHg. Aortic valve peak gradient measures 21.3 mmHg. Aortic valve  area, by VTI measures 1.26 cm. Pulmonic Valve: The pulmonic valve was grossly normal. Pulmonic valve regurgitation is not  visualized. Aorta: The aortic root is normal in size and structure. There is mild dilatation of the ascending aorta, measuring 42 mm. Venous: A pattern of systolic flow reversal, suggestive of severe mitral regurgitation is recorded from the right upper pulmonary vein. The inferior vena cava is dilated in size with greater than 50% respiratory variability, suggesting right atrial pressure of 8 mmHg. IAS/Shunts: No atrial level shunt detected by color flow Doppler.  LEFT VENTRICLE PLAX 2D LVIDd:         7.07 cm  Diastology LVIDs:         6.20 cm  LV e' medial:    4.03 cm/s LV PW:         1.60 cm  LV E/e' medial:  35.0 LV IVS:        1.00 cm  LV e' lateral:   6.42 cm/s LVOT diam:     1.90 cm  LV E/e' lateral: 22.0 LV SV:         47 LV SV Index:   23 LVOT Area:     2.84 cm  RIGHT VENTRICLE             IVC RV Basal diam:  3.80 cm     IVC diam: 2.10 cm RV Mid diam:    2.80 cm RV S prime:     11.60 cm/s TAPSE (M-mode): 2.0 cm LEFT ATRIUM             Index       RIGHT ATRIUM           Index LA diam:        4.30 cm 2.08 cm/m  RA Area:     18.40 cm LA Vol (A2C):   79.3 ml 38.37 ml/m RA Volume:   51.50 ml  24.92 ml/m LA Vol (A4C):   68.8 ml 33.29 ml/m LA Biplane Vol: 74.4 ml 36.00 ml/m  AORTIC VALVE AV Area (Vmax):    1.29 cm AV Area (Vmean):   1.25 cm AV Area (VTI):     1.26 cm AV Vmax:           231.00 cm/s AV Vmean:          157.500 cm/s AV VTI:            0.375 m AV Peak Grad:      21.3 mmHg AV Mean Grad:      11.0 mmHg LVOT Vmax:         105.00 cm/s LVOT Vmean:        69.200 cm/s LVOT VTI:          0.167 m LVOT/AV VTI ratio: 0.45 AI PHT:            359 msec  AORTA Ao Root diam: 3.00 cm Ao Asc diam:  4.20 cm MITRAL VALVE                 TRICUSPID VALVE MV Area (PHT): 3.60 cm      TR Peak grad:   23.8 mmHg MV Decel Time: 211 msec      TR Vmax:        244.00 cm/s MR Peak grad:    95.8 mmHg MR Mean grad:    59.5 mmHg   SHUNTS MR Vmax:         489.50 cm/s Systemic VTI:  0.17 m MR Vmean:  364.0 cm/s  Systemic  Diam: 1.90 cm MR PISA:         1.01 cm MR PISA Eff ROA: 8 mm MR PISA Radius:  0.40 cm MV E velocity: 141.00 cm/s MV A velocity: 110.00 cm/s MV E/A ratio:  1.28 Mihai Croitoru MD Electronically signed by [name] MD Signature Date/Time: 06/21/2021/3:17:41 PM    Final      Assessment and Plan:   Atrial fib with LBBB is on amiodarone at home will give IV bolus here and drip hold po for now.  He is on eliquis.  Difficult to add BB with hypotension on midodrine.  Echo is stable NICM with EF 20-25% and no acute changes on echo euvolemic currently Lethargy per IM ? Due to pain meds ESRD on HD 4 days per week.  LBBB not a candidate for CRT due to problems with venous access for dialysis, and high risk of infection Weakness per iM   Risk Assessment/Risk Scores:          CHA2DS2-VASc Score = 6  This indicates a 9.7% annual risk of stroke. The patient's score is based upon: CHF History: Yes HTN History: No Diabetes History: No Stroke History: Yes Vascular Disease History: Yes Age Score: 2 Gender Score: 0        For questions or updates, please contact [place] Please consult [email or web address] for contact info under    Signed, Cecilie Kicks, NP  06/21/2021 6:12 PM

## 2021-06-21 NOTE — Plan of Care (Signed)

## 2021-06-21 NOTE — Progress Notes (Addendum)
Pt received from dialysis, able to state name, oriented to self, however slow to respond. Upon connecting patient to tele pt noted to be in v tach HR sustained in the 150s. Denies chest pain or palpitations. EKG obtained, which showed a fib with RVR. RN paged covering provider, provider MD Alfredia Ferguson in route at this time. No prn medications available currently. RN at bedside awaiting MD.

## 2021-06-22 ENCOUNTER — Inpatient Hospital Stay (HOSPITAL_COMMUNITY): Payer: Medicare HMO

## 2021-06-22 ENCOUNTER — Observation Stay (HOSPITAL_COMMUNITY): Payer: Medicare HMO

## 2021-06-22 DIAGNOSIS — I5023 Acute on chronic systolic (congestive) heart failure: Secondary | ICD-10-CM | POA: Diagnosis not present

## 2021-06-22 DIAGNOSIS — Z515 Encounter for palliative care: Secondary | ICD-10-CM | POA: Diagnosis not present

## 2021-06-22 DIAGNOSIS — Z992 Dependence on renal dialysis: Secondary | ICD-10-CM | POA: Diagnosis not present

## 2021-06-22 DIAGNOSIS — Z66 Do not resuscitate: Secondary | ICD-10-CM | POA: Diagnosis not present

## 2021-06-22 DIAGNOSIS — Y838 Other surgical procedures as the cause of abnormal reaction of the patient, or of later complication, without mention of misadventure at the time of the procedure: Secondary | ICD-10-CM | POA: Diagnosis present

## 2021-06-22 DIAGNOSIS — I517 Cardiomegaly: Secondary | ICD-10-CM | POA: Diagnosis not present

## 2021-06-22 DIAGNOSIS — T82898A Other specified complication of vascular prosthetic devices, implants and grafts, initial encounter: Secondary | ICD-10-CM | POA: Diagnosis present

## 2021-06-22 DIAGNOSIS — R531 Weakness: Secondary | ICD-10-CM | POA: Diagnosis not present

## 2021-06-22 DIAGNOSIS — J449 Chronic obstructive pulmonary disease, unspecified: Secondary | ICD-10-CM | POA: Diagnosis present

## 2021-06-22 DIAGNOSIS — I4819 Other persistent atrial fibrillation: Secondary | ICD-10-CM | POA: Diagnosis present

## 2021-06-22 DIAGNOSIS — I2510 Atherosclerotic heart disease of native coronary artery without angina pectoris: Secondary | ICD-10-CM | POA: Diagnosis present

## 2021-06-22 DIAGNOSIS — Z8616 Personal history of COVID-19: Secondary | ICD-10-CM | POA: Diagnosis not present

## 2021-06-22 DIAGNOSIS — W19XXXD Unspecified fall, subsequent encounter: Secondary | ICD-10-CM | POA: Diagnosis present

## 2021-06-22 DIAGNOSIS — I953 Hypotension of hemodialysis: Secondary | ICD-10-CM | POA: Diagnosis not present

## 2021-06-22 DIAGNOSIS — R0602 Shortness of breath: Secondary | ICD-10-CM | POA: Diagnosis not present

## 2021-06-22 DIAGNOSIS — I132 Hypertensive heart and chronic kidney disease with heart failure and with stage 5 chronic kidney disease, or end stage renal disease: Secondary | ICD-10-CM | POA: Diagnosis present

## 2021-06-22 DIAGNOSIS — R4182 Altered mental status, unspecified: Secondary | ICD-10-CM | POA: Diagnosis not present

## 2021-06-22 DIAGNOSIS — D631 Anemia in chronic kidney disease: Secondary | ICD-10-CM | POA: Diagnosis present

## 2021-06-22 DIAGNOSIS — N2581 Secondary hyperparathyroidism of renal origin: Secondary | ICD-10-CM | POA: Diagnosis present

## 2021-06-22 DIAGNOSIS — Z7901 Long term (current) use of anticoagulants: Secondary | ICD-10-CM | POA: Diagnosis not present

## 2021-06-22 DIAGNOSIS — I5043 Acute on chronic combined systolic (congestive) and diastolic (congestive) heart failure: Secondary | ICD-10-CM | POA: Diagnosis present

## 2021-06-22 DIAGNOSIS — I420 Dilated cardiomyopathy: Secondary | ICD-10-CM | POA: Diagnosis present

## 2021-06-22 DIAGNOSIS — I5082 Biventricular heart failure: Secondary | ICD-10-CM | POA: Diagnosis present

## 2021-06-22 DIAGNOSIS — R110 Nausea: Secondary | ICD-10-CM | POA: Diagnosis present

## 2021-06-22 DIAGNOSIS — N186 End stage renal disease: Secondary | ICD-10-CM | POA: Diagnosis present

## 2021-06-22 DIAGNOSIS — I4891 Unspecified atrial fibrillation: Secondary | ICD-10-CM | POA: Diagnosis not present

## 2021-06-22 DIAGNOSIS — I447 Left bundle-branch block, unspecified: Secondary | ICD-10-CM | POA: Diagnosis present

## 2021-06-22 DIAGNOSIS — Z20822 Contact with and (suspected) exposure to covid-19: Secondary | ICD-10-CM | POA: Diagnosis present

## 2021-06-22 DIAGNOSIS — E440 Moderate protein-calorie malnutrition: Secondary | ICD-10-CM | POA: Diagnosis present

## 2021-06-22 DIAGNOSIS — G9341 Metabolic encephalopathy: Secondary | ICD-10-CM | POA: Diagnosis present

## 2021-06-22 DIAGNOSIS — N390 Urinary tract infection, site not specified: Secondary | ICD-10-CM | POA: Diagnosis present

## 2021-06-22 DIAGNOSIS — B20 Human immunodeficiency virus [HIV] disease: Secondary | ICD-10-CM | POA: Diagnosis present

## 2021-06-22 LAB — URINALYSIS, ROUTINE W REFLEX MICROSCOPIC
Bilirubin Urine: NEGATIVE
Glucose, UA: 50 mg/dL — AB
Ketones, ur: NEGATIVE mg/dL
Nitrite: NEGATIVE
Protein, ur: 100 mg/dL — AB
Specific Gravity, Urine: 1.011 (ref 1.005–1.030)
WBC, UA: >50 WBC/hpf — ABNORMAL HIGH (ref 0–5)
pH: 9 — ABNORMAL HIGH (ref 5.0–8.0)

## 2021-06-22 LAB — LACTIC ACID, PLASMA
Lactic Acid, Venous: 1.8 mmol/L (ref 0.5–1.9)
Lactic Acid, Venous: 1.7 mmol/L (ref 0.5–1.9)

## 2021-06-22 LAB — CBC WITH DIFFERENTIAL/PLATELET
Abs Immature Granulocytes: 0.11 10*3/uL — ABNORMAL HIGH (ref 0.00–0.07)
Basophils Absolute: 0 10*3/uL (ref 0.0–0.1)
Basophils Relative: 0 %
Eosinophils Absolute: 0 10*3/uL (ref 0.0–0.5)
Eosinophils Relative: 0 %
HCT: 36.5 % — ABNORMAL LOW (ref 39.0–52.0)
Hemoglobin: 12.2 g/dL — ABNORMAL LOW (ref 13.0–17.0)
Immature Granulocytes: 1 %
Lymphocytes Relative: 5 %
Lymphs Abs: 0.8 10*3/uL (ref 0.7–4.0)
MCH: 35.3 pg — ABNORMAL HIGH (ref 26.0–34.0)
MCHC: 33.4 g/dL (ref 30.0–36.0)
MCV: 105.5 fL — ABNORMAL HIGH (ref 80.0–100.0)
Monocytes Absolute: 2.3 10*3/uL — ABNORMAL HIGH (ref 0.1–1.0)
Monocytes Relative: 14 %
Neutro Abs: 13.4 10*3/uL — ABNORMAL HIGH (ref 1.7–7.7)
Neutrophils Relative %: 80 %
Platelets: 272 10*3/uL (ref 150–400)
RBC: 3.46 MIL/uL — ABNORMAL LOW (ref 4.22–5.81)
RDW: 15.1 % (ref 11.5–15.5)
WBC: 16.6 10*3/uL — ABNORMAL HIGH (ref 4.0–10.5)
nRBC: 0 % (ref 0.0–0.2)

## 2021-06-22 LAB — COMPREHENSIVE METABOLIC PANEL
ALT: 13 U/L (ref 0–44)
AST: 51 U/L — ABNORMAL HIGH (ref 15–41)
Albumin: 3.1 g/dL — ABNORMAL LOW (ref 3.5–5.0)
Alkaline Phosphatase: 122 U/L (ref 38–126)
Anion gap: 18 — ABNORMAL HIGH (ref 5–15)
BUN: 44 mg/dL — ABNORMAL HIGH (ref 8–23)
CO2: 27 mmol/L (ref 22–32)
Calcium: 9.6 mg/dL (ref 8.9–10.3)
Chloride: 93 mmol/L — ABNORMAL LOW (ref 98–111)
Creatinine, Ser: 7.51 mg/dL — ABNORMAL HIGH (ref 0.61–1.24)
GFR, Estimated: 7 mL/min — ABNORMAL LOW (ref >60)
Glucose, Bld: 126 mg/dL — ABNORMAL HIGH (ref 70–99)
Potassium: 5.3 mmol/L — ABNORMAL HIGH (ref 3.5–5.1)
Sodium: 138 mmol/L (ref 135–145)
Total Bilirubin: 1 mg/dL (ref 0.3–1.2)
Total Protein: 7.6 g/dL (ref 6.5–8.1)

## 2021-06-22 LAB — HIV-1 RNA QUANT-NO REFLEX-BLD
HIV 1 RNA Quant: <20 copies/mL
LOG10 HIV-1 RNA: UNDETERMINED log10copy/mL

## 2021-06-22 LAB — RPR
RPR Ser Ql: REACTIVE — AB
RPR Titer: 1:2 {titer}

## 2021-06-22 LAB — APTT
aPTT: 41 seconds — ABNORMAL HIGH (ref 24–36)
aPTT: 38 seconds — ABNORMAL HIGH (ref 24–36)
aPTT: 46 seconds — ABNORMAL HIGH (ref 24–36)

## 2021-06-22 LAB — VITAMIN B12: Vitamin B-12: 1016 pg/mL — ABNORMAL HIGH (ref 180–914)

## 2021-06-22 LAB — HEPARIN LEVEL (UNFRACTIONATED): Heparin Unfractionated: >1.1 IU/mL — ABNORMAL HIGH (ref 0.30–0.70)

## 2021-06-22 LAB — PHOSPHORUS: Phosphorus: 8.2 mg/dL — ABNORMAL HIGH (ref 2.5–4.6)

## 2021-06-22 LAB — MAGNESIUM: Magnesium: 2.6 mg/dL — ABNORMAL HIGH (ref 1.7–2.4)

## 2021-06-22 LAB — TSH: TSH: 1.094 u[IU]/mL (ref 0.350–4.500)

## 2021-06-22 MED ORDER — METOPROLOL TARTRATE 5 MG/5ML IV SOLN
5.0000 mg | Freq: Once | INTRAVENOUS | Status: AC
  Administered 2021-06-22: 5 mg via INTRAVENOUS
  Filled 2021-06-22: qty 5

## 2021-06-22 MED ORDER — PANTOPRAZOLE SODIUM 40 MG IV SOLR
40.0000 mg | Freq: Every day | INTRAVENOUS | Status: DC
  Administered 2021-06-22 – 2021-06-24 (×3): 40 mg via INTRAVENOUS
  Filled 2021-06-22 (×4): qty 40

## 2021-06-22 MED ORDER — SODIUM ZIRCONIUM CYCLOSILICATE 10 G PO PACK
10.0000 g | PACK | Freq: Every day | ORAL | Status: DC
  Administered 2021-06-22 – 2021-06-24 (×3): 10 g via ORAL
  Filled 2021-06-22 (×3): qty 1

## 2021-06-22 MED ORDER — PROSOURCE PLUS PO LIQD
30.0000 mL | Freq: Three times a day (TID) | ORAL | Status: DC
  Administered 2021-06-22 – 2021-06-26 (×9): 30 mL via ORAL
  Filled 2021-06-22 (×9): qty 30

## 2021-06-22 NOTE — Progress Notes (Signed)
PT Cancellation Note  Patient Details Name: David [name] MRN: [id number] DOB: [birth year]   Cancelled Treatment:    Reason Eval/Treat Not Completed: Fatigue/lethargy limiting ability to participate. Will follow-up for PT Evaluation as schedule permits.  Mabeline Caras, PT, DPT Acute Rehabilitation Services  Pager [phone] Office [place] 06/22/2021, 8:02 AM

## 2021-06-22 NOTE — Plan of Care (Signed)

## 2021-06-22 NOTE — Progress Notes (Signed)
EEG complete - results pending 

## 2021-06-22 NOTE — Progress Notes (Signed)
OT Cancellation Note  Patient Details Name: David [name] MRN: [id number] DOB: 1941/05/30   Cancelled Treatment:    Reason Eval/Treat Not Completed: Fatigue/lethargy limiting ability to participate (only able to track therapist in room and report name)  Joeseph Amor 06/22/2021, 7:48 AM

## 2021-06-22 NOTE — Procedures (Signed)
Patient Name: David [name]  MRN: [id number]  Epilepsy Attending: Lora Havens  Referring Physician/Provider: Dr Carlisle Cater Date: 06/22/2021 Duration: 24.32 mins  Patient history: 80 year old male with altered mental status.  EEG to evaluate for seizures.  Level of alertness: Awake, asleep  AEDs during EEG study: None  Technical aspects: This EEG study was done with scalp electrodes positioned according to the 10-20 International system of electrode placement. Electrical activity was acquired at a sampling rate of 500Hz  and reviewed with a high frequency filter of 70Hz  and a low frequency filter of 1Hz . EEG data were recorded continuously and digitally stored.   Description: No clear posterior dominant rhythm was seen. Sleep was characterized by vertex waves, sleep spindles (12 to 14 Hz), maximal frontocentral region.  EEG also showed continuous 6-8 Hz theta-alpha activity as well as intermittent generalized 2 to 3 Hz delta slowing.  Hyperventilation and photic stimulation were not performed.     ABNORMALITY - Continuous slow, generalized  IMPRESSION: This study is suggestive of moderate diffuse encephalopathy, nonspecific etiology. No seizures or epileptiform discharges were seen throughout the recording.   David [name]

## 2021-06-22 NOTE — Progress Notes (Signed)
Initial Nutrition Assessment  DOCUMENTATION CODES:   Not applicable  INTERVENTION:   -Renal MVI daily -30 ml Prosource Plus TID, each supplement provides 100 kcals and 15 grams protein -Magic cup TID with meals, each supplement provides 290 kcal and 9 grams of protein  -Feeding assistance with meals  NUTRITION DIAGNOSIS:   Increased nutrient needs related to chronic illness (ESRD on HD) as evidenced by estimated needs.  GOAL:   Patient will meet greater than or equal to 90% of their needs  MONITOR:   PO intake, Supplement acceptance, Diet advancement, Labs, Weight trends, Skin, I & O's  REASON FOR ASSESSMENT:   Consult Assessment of nutrition requirement/status  ASSESSMENT:   David [name] is a 80 y.o. male with medical history significant for ESRD on HD TTS, paroxysmal A. fib on Eliquis, recent admission for right hip fracture post repair, HIV, gout, chronic hypotension, GERD who presented to [name] ED from hemodialysis due to generalized weakness and nausea.  Work-up in the ED revealed electrolyte disturbances, elevated BNP greater than 4000, mild pulmonary edema with concern for acute CHF, and elevated troponin with TRH, hospitalist team, was asked to admit.  Unable to obtain a history from the patient due to lethargy.  Pt admitted with generalized weakness post HD.   7/20- s/p BSE- advanced to dysphagia 2 diet with thin liquids   Reviewed I/O's: -1.4 L x 24 hours  Pt very lethargic at time of visit and did not respond to voice or touch.   Observed lunch tray, which was unattempted. Noted meal completions documented at 5%.  Per npehrology notes, EDW 82 kg. Pt is currently below dry weight. Reviewed wt hx; pt has experienced a 9.4% wt loss over the past 4 months, which is significant for time frame. Suspect wt loss may be due to fluid losses from HD.   Medications reviewed.   Labs reviewed: K: 5.3, Phos: 8.2, Mg: 2.6.   NUTRITION - FOCUSED PHYSICAL  EXAM:  Flowsheet Row Most Recent Value  Orbital Region No depletion  Upper Arm Region No depletion  Thoracic and Lumbar Region No depletion  Buccal Region No depletion  Temple Region No depletion  Clavicle Bone Region No depletion  Clavicle and Acromion Bone Region No depletion  Scapular Bone Region No depletion  Dorsal Hand No depletion  Patellar Region No depletion  Anterior Thigh Region No depletion  Posterior Calf Region No depletion  Edema (RD Assessment) None  Hair Reviewed  Eyes Reviewed  Mouth Reviewed  Skin Reviewed  Nails Reviewed       Diet Order:   Diet Order             DIET DYS 2 Room service appropriate? Yes; Fluid consistency: Thin  Diet effective now                   EDUCATION NEEDS:   Not appropriate for education at this time  Skin:  Skin Assessment: Skin Integrity Issues: Skin Integrity Issues:: Incisions Incisions: closed rt hip  Last BM:  06/21/21  Height:   Ht Readings from Last 1 Encounters:  06/13/2021 6\' 2"  (1.88 m)    Weight:   Wt Readings from Last 1 Encounters:  06/22/21 76.8 kg    Ideal Body Weight:  86.4 kg  BMI:  Body mass index is 21.75 kg/m.  Estimated Nutritional Needs:   Kcal:  2100-2300  Protein:  1185-130 grams  Fluid:  1000 ml + UOP    Loistine Chance, RD, LDN,  CDCES Registered Dietitian II Certified Diabetes Care and Education Specialist Please refer to [facility] for RD and/or RD on-call/weekend/after hours pager

## 2021-06-22 NOTE — Progress Notes (Addendum)
Spoke with RRT Mindy, RN and notified of pt's condition. Ice packs applied to pt at her suggestion due to temp continuing to be 100.9 oral. Pt converted back to afib with rate 110-150's. Notified MD via [place] paged. Pt is more alert at this time, nodding and attempting to mouth answers to my questions but unable to produce sounds at this time. Denies pain or discomfort.

## 2021-06-22 NOTE — Evaluation (Signed)
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   Acute on chronic systolic and diastolic heart failure, NYHA class 4 (HCC)    Anemia, iron deficiency 11/15/2011   Arthritis    "hands, right knee, feet" (02/21/2018)   Atrial fibrillation (HCC)    Cardiac arrest ([name]) 02/17/2018   CHF (congestive heart failure) (HCC)    Chronic combined systolic and diastolic heart failure, NYHA class 3 ([place]) 06/2010   felt to be secondary to hypertensive cardiomyopathy   Chronic lower back pain    CKD (chronic kidney disease) stage V requiring chronic dialysis ([place])    COVID-19 12/2019   ESRD on dialysis ([place])    started 02/2018 [place]   GERD (gastroesophageal reflux disease)    Glaucoma    "I had surgery and dont have it any more"   Gout    daily RX (02/21/2018)   Heart murmur    "mild" per pt   Hepatitis    years ago   HIV (human immunodeficiency virus infection) ([place])    HIV infection ([place])    Hyperlipidemia    Hypertension    Nonischemic cardiomyopathy ([place])    Pneumonia 11/2017   Prostate cancer ([place])    hx of prostate; s/p radioactive seed implant 10/2009 Dr Janice Norrie   Sinus bradycardia    Sleep apnea    does not use a cpap   Stroke ([place])    "mini stroke" years ago   Wears glasses    Past Surgical History:  Past Surgical History:  Procedure Laterality Date   A/V FISTULAGRAM Bilateral 07/30/2020   Procedure: CENTRAL VENOGRAM;  Surgeon: Angelia Mould, MD;  Location: [place] [facility];  Service: Cardiovascular;  Laterality: Bilateral;  bilateral upper venogram    AV FISTULA PLACEMENT Right 10/13/2016   Procedure: ARTERIOVENOUS  (AV) FISTULA CREATION;  Surgeon: [name], MD;  Location: [place];  Service: Vascular;  Laterality: Right;   AV FISTULA PLACEMENT Left 02/25/2018   Procedure: INSERTION OF ARTERIOVENOUS (AV) GORE-TEX GRAFT LEFT UPPER ARM;  Surgeon: Angelia Mould, MD;  Location: [place];  Service: Vascular;  Laterality: Left;   AV FISTULA PLACEMENT Right 08/07/2018   Procedure: Creation of right arm brachiocephalic Fistula;  Surgeon: Waynetta Sandy, MD;  Location: [place];  Service: Vascular;  Laterality: Right;   AV FISTULA PLACEMENT Left 11/10/2019   Procedure: INSERTION OF ARTERIOVENOUS (AV) GORE-TEX GRAFT THIGH;  Surgeon: Elam Dutch, MD;  Location: [place];  Service: Vascular;  Laterality: Left;   AV FISTULA PLACEMENT Right 12/02/2019   Procedure: INSERTION OF ARTERIOVENOUS (AV) GORE-TEX GRAFT RIGHT  THIGH;  Surgeon: Waynetta Sandy, MD;  Location: [place];  Service: Vascular;  Laterality: Right;   AV FISTULA PLACEMENT Right 08/13/2020   Procedure: INSERTION OF RIGHT ARTERIOVENOUS (AV) GORE-TEX GRAFT ARM;  Surgeon: Angelia Mould, MD;  Location: [place];  Service: Vascular;  Laterality: Right;   [place] Left 06/24/2015   Procedure: BASILIC VEIN TRANSPOSITION;  Surgeon: Mal Misty, MD;  Location: [place];  Service: Vascular;  Laterality: Left;   BIOPSY  03/14/2019   Procedure: BIOPSY;  Surgeon: Irene Shipper, MD;  Location: [place];  Service: Endoscopy;;   CATARACT EXTRACTION, BILATERAL Bilateral    COLONOSCOPY WITH PROPOFOL N/A 03/14/2019   Procedure: COLONOSCOPY WITH PROPOFOL;  Surgeon: Irene Shipper, MD;  Location[facility] END[facility];  Service: Endoscopy;  Laterality: N/A;   ESOPHAGOGASTRODUODENOSCOPY (EGD) WITH PROPOFOL N/A 05/05/2014   Procedure: ESOPHAGOGASTRODUODENOSCOPY (EGD) WITH PROPOFOL;  Surgeon: Missy Sabins, MD;  Location: [facility]OS[facility];  Service: Endoscopy;  Laterality: N/A;   EYE SURGERY Bilateral    cataract surgery    GLAUCOMA SURGERY Bilateral     HIP PINNING,CANNULATED Right 06/12/2021   Procedure: CANNULATED RIGHT HIP PINNING;  Surgeon: Jessy Oto, MD;  Location: [place];  Service: Orthopedics;  Laterality: Right;   INSERTION OF ARTERIOVENOUS (AV) ARTEGRAFT ARM  10/09/2018   Procedure: INSERTION OF ARTERIOVENOUS (AV) 37mm x 41cm ARTEGRAFT LEFT UPPER ARM;  Surgeon: Waynetta Sandy, MD;  Location: [place];  Service: Vascular;;   INSERTION OF DIALYSIS CATHETER Right 07/14/2019   Procedure: Insertion Of Dialysis Catheter;  Surgeon: Elam Dutch, MD;  Location: [facility] OR;  Service: Vascular;  Laterality: Right;  placed right Internal Jugular   INSERTION OF DIALYSIS CATHETER Left 08/13/2020   Procedure: INSERTION OF DIALYSIS CATHETER;  Surgeon: Angelia Mould, MD;  Location: [place];  Service: Vascular;  Laterality: Left;   INSERTION PROSTATE RADIATION SEED     IR FLUORO GUIDE CV LINE RIGHT  02/18/2018   IR REMOVAL TUN CV CATH W/O FL  12/06/2018   IR THROMBECTOMY AV FISTULA W/THROMBOLYSIS INC/SHUNT/IMG RIGHT  11/12/2020   IR THROMBECTOMY AV FISTULA W/THROMBOLYSIS/PTA INC/SHUNT/IMG RIGHT Right 01/26/2020   IR THROMBECTOMY AV FISTULA W/THROMBOLYSIS/PTA INC/SHUNT/IMG RIGHT Right 02/24/2020   IR THROMBECTOMY AV FISTULA W/THROMBOLYSIS/PTA INC/SHUNT/IMG RIGHT Right 04/07/2020   IR US GUIDE VASC ACCESS RIGHT  02/18/2018   IR US GUIDE VASC ACCESS RIGHT  01/26/2020   IR US GUIDE VASC ACCESS RIGHT  04/07/2020   IR US GUIDE VASC ACCESS RIGHT  11/12/2020   LEFT HEART CATH AND CORONARY ANGIOGRAPHY N/A 02/20/2018   Procedure: LEFT HEART CATH AND CORONARY ANGIOGRAPHY;  Surgeon: Jettie Booze, MD;  Location: [facility];;; 50% ostial OM2, 25% mLAD.   LEFT HEART CATH AND CORONARY ANGIOGRAPHY  2004   mildly depressed LV systolic fx EF 24%,OXBDZH coronaries/abdominal aorta/renal arteries.   LOWER EXTREMITY ANGIOGRAM Left 11/17/2019   Procedure: Lower Extremity Fistulogram;  Surgeon: Marty Heck, MD;  Location: [place];  Service:  Vascular;  Laterality: Left;   NM MYOCAR PERF WALL MOTION  02/21/2010   No ischemia or infarction.  EF 27%.   RADIOACTIVE SEED IMPLANT  2010   prostate cancer   REMOVAL OF A DIALYSIS CATHETER Right 08/13/2020   Procedure: REMOVAL OF A DIALYSIS CATHETER;  Surgeon: Angelia Mould, MD;  [facility] OR;  Service: Vascular;  Laterality: Right;   REVISION OF ARTERIOVENOUS GORETEX GRAFT Right 07/11/2019   Procedure: REVISION OF ARTERIOVENOUS GORETEX GRAFT RIGHT ARM WITH ARTEGRAFT;  Surgeon: Serafina Mitchell, MD;  Location: [facility];  Service: Vascular;  Laterality: Right;   SHUNTOGRAM Right 07/14/2019   Procedure: Earney Mallet;  Surgeon: Elam Dutch, MD;  Location: [place];  Service: Vascular;  Laterality: Right;   THROMBECTOMY AND REVISION OF ARTERIOVENTOUS (AV) GORETEX  GRAFT Right 07/14/2019   Procedure: THROMBECTOMY AND REVISION OF ARTERIOVENTOUS (AV) GORETEX  GRAFT;  Surgeon: Elam Dutch, MD;  Location: [place];  Service: Vascular;  Laterality: Right;   THROMBECTOMY W/ EMBOLECTOMY Left 02/26/2018   Procedure: THROMBECTOMY ARTERIOVENOUS GRAFT;  Surgeon: Angelia Mould, MD;  Location: [place];  Service: Vascular;  Laterality: Left;   THROMBECTOMY W/ EMBOLECTOMY Left 11/17/2019   Procedure: THROMBECTOMY ARTERIOVENOUS GRAFT LEFT THIGH;  Surgeon: Marty Heck, MD;  Location: [place];  Service: Vascular;  Laterality: Left;   TRANSTHORACIC ECHOCARDIOGRAM  02/2012   [facility]) EF 45-50% with mild global HK.  Mild LVH,LA mod. dilated,mild-mod. 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 AI.   TRANSTHORACIC ECHOCARDIOGRAM  9/'18; 3/'19   a) Severely reduced EF.  25 from 30%.  GR 1 DD with diffuse ecchymosis. Mild AS & AI.  Mild LA/RA dilation; b) (post PEA arest):   Moderately dilated LV with moderate concentric virtually.  EF 15 to 20%.  GR 1 DD.  Paradoxical septal motion. Mild AI.  Mild LA dilation.  Poorly visualized RV.   TRANSTHORACIC ECHOCARDIOGRAM  02/2019   EF  20-25%.  Unable to assess diastolic function (A. Fib).  No LV apical thrombus.  Mild AI.  Mildly reduced RV function, however poorly visualized.   ULTRASOUND GUIDANCE FOR VASCULAR ACCESS  02/20/2018   Procedure: Ultrasound Guidance For Vascular Access;  Surgeon: Jettie Booze, MD;  Location: [place] [facility];  Service: Cardiovascular;;   UPPER EXTREMITY VENOGRAPHY N/A 07/08/2018   Procedure: UPPER EXTREMITY VENOGRAPHY;  Surgeon: Waynetta Sandy, MD;  Location: [place] [facility];  Service: Cardiovascular;  Laterality: N/A;  Bilateral   HPI:  Pt arrived from SNF due to generalized weakness and nausea and ongoing lethargy. Pt has a history of chronic illness, HIV, ESRD on HD, GERD, Gout, HTN, recent right hip fx. SLP ordered due to pt lethargy to determine safety with diet. CXR and MRI WNL.   Assessment / Plan / Recommendation Clinical Impression  Pt demonstrates confusion. He needs assistance to sit up and recognize method of PO intake, verbal cuesing needed to reduce oral holding and residue with purees. No signs of aspiraiton observed however. Given decreased awareness and confusion impacting PO consumption, recommend softer dys 2 diet with assist with meals. Pt can continue sips of thin liquids. May need meds crushed. SLP will f/u for tolerance to uprgrade as mentation improves. SLP Visit Diagnosis: Dysphagia, unspecified (R13.10)    Aspiration Risk  Mild aspiration risk;Risk for inadequate nutrition/hydration    Diet Recommendation Dysphagia 2 (Fine chop);Thin liquid   Liquid Administration via: Cup;Straw Medication Administration: Crushed with puree Compensations: Slow rate;Small sips/bites;Minimize environmental distractions Postural Changes: Seated upright at 90 degrees    Other  Recommendations Oral Care Recommendations: Oral care BID   Follow up Recommendations        Frequency and Duration            Prognosis        Swallow Study   General HPI: Pt  arrived from SNF due to generalized weakness and nausea and ongoing lethargy. Pt has a history of chronic illness, HIV, ESRD on HD, GERD, Gout, HTN, recent right hip fx. SLP ordered due to pt lethargy to determine safety with diet. CXR and MRI WNL. Type of Study: Bedside Swallow Evaluation Previous Swallow Assessment: none Diet Prior to this Study: Regular;Thin liquids Temperature Spikes Noted: Yes Respiratory Status: Room air History of Recent Intubation: No Behavior/Cognition: Requires cueing;Alert Oral Cavity Assessment: Within Functional Limits Oral Care Completed by SLP: No Oral Cavity - Dentition: Adequate natural dentition Vision: Functional for self-feeding Self-Feeding Abilities: Total assist Patient Positioning: Upright in bed Baseline Vocal Quality: Low vocal intensity Volitional Cough: Strong  Volitional Swallow: Unable to elicit    Oral/Motor/Sensory Function Overall Oral Motor/Sensory Function: Within functional limits   Ice Chips Ice chips: Not tested   Thin Liquid Thin Liquid: Within functional limits Presentation: Straw    Nectar Thick Nectar Thick Liquid: Not tested   Honey Thick Honey Thick Liquid: Not tested   Puree Puree: Impaired Presentation: Spoon Oral Phase Functional Implications: Prolonged oral transit;Oral holding   Solid     Solid: Not tested      Mckynlee Luse, Katherene Ponto 06/22/2021,9:24 AM

## 2021-06-22 NOTE — Progress Notes (Addendum)
Patient ID: David [name], male   DOB: 1941/05/09, 80 y.o.   MRN: [id number] S: remains lethargic but more awake than yesterday.  Temp elevated at 100.9 and had episode of A fib with RVR in the 110-150's. O:BP 117/67 (BP Location: Left Arm)   Pulse 76   Temp 99.1 F (37.3 C) (Oral)   Resp 16   Ht 6\' 2"  (1.88 m)   Wt 76.8 kg   SpO2 98%   BMI 21.75 kg/m   Intake/Output Summary (Last 24 hours) at 06/22/2021 1052 Last data filed at 06/22/2021 0400 Gross per 24 hour  Intake 568.51 ml  Output 2000 ml  Net -1431.49 ml   Intake/Output: I/O last 3 completed shifts: In: 568.5 [I.V.:473.6; IV Piggyback:94.9] Out: 2000 [Other:2000]  Intake/Output this shift:  No intake/output data recorded. Weight change: -3.761 kg RFF:MBWGY, chronically ill-appearing male in NAD, lethargic but able to speak softly CVS:IRR Resp:CTA Abd: +BS, soft, NT/ND Ext:no edema, RUE AVG +T/B  Recent Labs  Lab 06/16/21 0747 07/01/2021 1700 06/26/2021 2105 06/21/21 0814 06/21/21 1136 06/21/21 1742 06/22/21 0235  NA 135 137  --  137 137 138 138  K 4.3 5.4*  --  5.7* 5.4* 5.1 5.3*  CL 96* 93*  --  94*  --  94* 93*  CO2 24 27  --  28  --  26 27  GLUCOSE 127* 114*  --  99  --  94 126*  BUN 60* 56*  --  75*  --  44* 44*  CREATININE 9.50* 9.26* 9.68* 10.68*  --  7.23* 7.51*  ALBUMIN 3.3* 3.2*  --  2.9*  --  3.5 3.1*  CALCIUM 8.9 8.9  --  8.9  --  9.5 9.6  PHOS 6.9* 8.5*  --  10.3*  --  7.7* 8.2*  AST  --  29  --  24  --  42* 51*  ALT  --  7  --  6  --  12 13   Liver Function Tests: Recent Labs  Lab 06/21/21 0814 06/21/21 1742 06/22/21 0235  AST 24 42* 51*  ALT 6 12 13   ALKPHOS 105 126 122  BILITOT 1.1 1.7* 1.0  PROT 6.4* 8.2* 7.6  ALBUMIN 2.9* 3.5 3.1*   No results for input(s): LIPASE, AMYLASE in the last 168 hours. Recent Labs  Lab 06/19/2021 1700  AMMONIA 31   CBC: Recent Labs  Lab 06/12/2021 1700 06/19/2021 2105 06/21/21 0814 06/21/21 1136 06/21/21 1843 06/22/21 0235  WBC 9.3 9.7 10.3   --  17.4* 16.6*  NEUTROABS 7.4  --  7.9*  --  15.2* 13.4*  HGB 10.7* 10.8* 9.8* 10.5* 12.1* 12.2*  HCT 32.9* 33.4* 29.6* 31.0* 37.8* 36.5*  MCV 108.2* 110.2* 108.4*  --  110.5* 105.5*  PLT 281 260 260  --  214 272   Cardiac Enzymes: No results for input(s): CKTOTAL, CKMB, CKMBINDEX, TROPONINI in the last 168 hours. CBG: Recent Labs  Lab 06/29/2021 1529 06/27/2021 2212 06/21/21 0831  GLUCAP 116* 111* 110*    Iron Studies: No results for input(s): IRON, TIBC, TRANSFERRIN, FERRITIN in the last 72 hours. Studies/Results: CT Head Wo Contrast  Result Date: 06/19/2021 CLINICAL DATA:  Altered mental status EXAM: CT HEAD WITHOUT CONTRAST TECHNIQUE: Contiguous axial images were obtained from the base of the skull through the vertex without intravenous contrast. COMPARISON:  09/05/2019 FINDINGS: Brain: No evidence of acute infarction, hemorrhage, hydrocephalus, extra-axial collection or mass lesion/mass effect. Mild-moderate low-density changes within the periventricular and subcortical  white matter compatible with chronic microvascular ischemic change. Mild diffuse cerebral volume loss with stable size and configuration of the ventricles. Vascular: No hyperdense vessel or unexpected calcification. Skull: Normal. Negative for fracture or focal lesion. Sinuses/Orbits: No acute finding. Other: None. IMPRESSION: 1. No acute intracranial findings. 2. Chronic microvascular ischemic change and cerebral volume loss. Electronically Signed   By: Davina Poke D.O.   On: 06/03/2021 17:04   MR Brain Wo Contrast (neuro protocol)  Result Date: 07/01/2021 CLINICAL DATA:  Encephalopathy EXAM: MRI HEAD WITHOUT CONTRAST TECHNIQUE: Multiplanar, multiecho pulse sequences of the brain and surrounding structures were obtained without intravenous contrast. COMPARISON:  01/07/2019 FINDINGS: Brain: No acute infarct, mass effect or extra-axial collection. No acute or chronic hemorrhage. Hyperintense T2-weighted signal is  moderately widespread throughout the white matter. Generalized volume loss without a clear lobar predilection. The midline structures are normal. Vascular: Major flow voids are preserved. Skull and upper cervical spine: Normal calvarium and skull base. Visualized upper cervical spine and soft tissues are normal. Sinuses/Orbits:No paranasal sinus fluid levels or advanced mucosal thickening. No mastoid or middle ear effusion. Normal orbits. IMPRESSION: 1. No acute intracranial abnormality. 2. Generalized volume loss and findings of chronic microvascular ischemia. Electronically Signed   By: Ulyses Jarred M.D.   On: 06/06/2021 23:20   DG CHEST PORT 1 VIEW  Result Date: 06/22/2021 CLINICAL DATA:  Shortness of breath. EXAM: PORTABLE CHEST 1 VIEW COMPARISON:  06/21/2021. FINDINGS: Stable severe cardiomegaly. No pulmonary venous congestion. No focal infiltrate. No pleural effusion or pneumothorax. Degenerative change thoracic spine. IMPRESSION: 1.  Stable cardiomegaly.  No pulmonary venous congestion. 2.  No acute pulmonary disease. Electronically Signed   By: Marcello Moores  Register   On: 06/22/2021 06:28   DG CHEST PORT 1 VIEW  Result Date: 06/21/2021 CLINICAL DATA:  Fever. EXAM: PORTABLE CHEST 1 VIEW COMPARISON:  Radiograph yesterday. FINDINGS: Stable enlarged cardiac silhouette. Unchanged mediastinal contours. Slight increasing vascular congestion. No confluent consolidation. Minor right lung base atelectasis. No pneumothorax or pleural effusion. No acute osseous abnormalities. IMPRESSION: 1. Stable cardiomegaly. Slight increasing vascular congestion. 2. Minor right lung base atelectasis. Electronically Signed   By: Keith Rake M.D.   On: 06/21/2021 22:48   DG Chest Portable 1 View  Result Date: 06/10/2021 CLINICAL DATA:  Recent right hip fracture, lethargy, nausea EXAM: PORTABLE CHEST 1 VIEW COMPARISON:  06/10/2021 FINDINGS: Single frontal view of the chest demonstrates an enlarged cardiac silhouette. There  is chronic central vascular congestion without airspace disease, effusion, or pneumothorax. No acute bony abnormalities. IMPRESSION: 1. Stable enlarged cardiac silhouette and vascular congestion. No acute airspace disease. Electronically Signed   By: Randa Ngo M.D.   On: 06/17/2021 16:37   ECHOCARDIOGRAM COMPLETE  Result Date: 06/21/2021    ECHOCARDIOGRAM REPORT   Patient Name:   NAKUL AVINO [facility] Date of Exam: 06/21/2021 Medical Rec #:  [id number]            Height:       74.0 in Accession #:    [id number]           Weight:       177.7 lb Date of Birth:  1941/10/15            BSA:          2.067 m Patient Age:    38 years             BP:           126/66 mmHg Patient Gender: M  HR:           89 bpm. Exam Location:  Inpatient Procedure: 2D Echo, Cardiac Doppler and Color Doppler Indications:    I50.23 Acute on chronic systolic (congestive) heart failure  History:        Patient has prior history of Echocardiogram examinations, most                 recent 12/01/2020. Cardiomyopathy, Stroke, Arrythmias:Atrial                 Fibrillation, Signs/Symptoms:Murmur; Risk Factors:Hypertension,                 Dyslipidemia and Sleep Apnea. ESRD. COVID-19. GERD.  Sonographer:    Jonelle Sidle Dance Referring Phys: [id number] [place]  1. Left ventricular ejection fraction, by estimation, is 20 to 25%. The left ventricle has severely decreased function. The left ventricle demonstrates global hypokinesis. The left ventricular internal cavity size was severely dilated. Left ventricular diastolic parameters are consistent with Grade II diastolic dysfunction (pseudonormalization). Elevated left atrial pressure.  2. Right ventricular systolic function is mildly reduced. The right ventricular size is severely enlarged. There is normal pulmonary artery systolic pressure. The estimated right ventricular systolic pressure is 44.0 mmHg.  3. Left atrial size was moderately dilated.  4. Right atrial  size was mildly dilated.  5. The mitral valve is normal in structure. Severe mitral valve regurgitation.  6. The aortic valve is tricuspid. Aortic valve regurgitation is mild to moderate. Mild to moderate aortic valve stenosis.  7. There is mild dilatation of the ascending aorta, measuring 42 mm.  8. The inferior vena cava is dilated in size with >50% respiratory variability, suggesting right atrial pressure of 8 mmHg. Comparison(s): Prior images reviewed side by side. The left ventricular function is unchanged. The right ventricular systolic function is worse. Mitral regurgitation appears more severe. There is systolic flow reversal in the pulmonary veins. In a hemodialysis patient, this may be due to different loading conditions. FINDINGS  Left Ventricle: Left ventricular ejection fraction, by estimation, is 20 to 25%. The left ventricle has severely decreased function. The left ventricle demonstrates global hypokinesis. The left ventricular internal cavity size was severely dilated. There is no left ventricular hypertrophy. Abnormal (paradoxical) septal motion, consistent with left bundle branch block. Left ventricular diastolic parameters are consistent with Grade II diastolic dysfunction (pseudonormalization). Elevated left atrial  pressure. Right Ventricle: The right ventricular size is severely enlarged. No increase in right ventricular wall thickness. Right ventricular systolic function is mildly reduced. There is normal pulmonary artery systolic pressure. The tricuspid regurgitant velocity is 2.44 m/s, and with an assumed right atrial pressure of 8 mmHg, the estimated right ventricular systolic pressure is 10.2 mmHg. Left Atrium: Left atrial size was moderately dilated. Right Atrium: Right atrial size was mildly dilated. Pericardium: Trivial pericardial effusion is present. 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 The mitral valve is normal in structure. Severe mitral  valve regurgitation, with centrally-directed jet. Tricuspid Valve: The tricuspid valve is normal in structure. Tricuspid valve regurgitation is trivial. Aortic Valve: The aortic valve is tricuspid. Aortic valve regurgitation is mild to moderate. Aortic regurgitation PHT measures 359 msec. Mild to moderate aortic stenosis is present. Aortic valve mean gradient measures 11.0 mmHg. Aortic valve peak gradient measures 21.3 mmHg. Aortic valve area, by VTI measures 1.26 cm. Pulmonic Valve: The pulmonic valve was grossly normal. Pulmonic valve regurgitation is not visualized. Aorta: The aortic  root is normal in size and structure. There is mild dilatation of the ascending aorta, measuring 42 mm. Venous: A pattern of systolic flow reversal, suggestive of severe mitral regurgitation is recorded from the right upper pulmonary vein. The inferior vena cava is dilated in size with greater than 50% respiratory variability, suggesting right atrial pressure of 8 mmHg. IAS/Shunts: No atrial level shunt detected by color flow Doppler.  LEFT VENTRICLE PLAX 2D LVIDd:         7.07 cm  Diastology LVIDs:         6.20 cm  LV e' medial:    4.03 cm/s LV PW:         1.60 cm  LV E/e' medial:  35.0 LV IVS:        1.00 cm  LV e' lateral:   6.42 cm/s LVOT diam:     1.90 cm  LV E/e' lateral: 22.0 LV SV:         47 LV SV Index:   23 LVOT Area:     2.84 cm  RIGHT VENTRICLE             IVC RV Basal diam:  3.80 cm     IVC diam: 2.10 cm RV Mid diam:    2.80 cm RV S prime:     11.60 cm/s TAPSE (M-mode): 2.0 cm LEFT ATRIUM             Index       RIGHT ATRIUM           Index LA diam:        4.30 cm 2.08 cm/m  RA Area:     18.40 cm LA Vol (A2C):   79.3 ml 38.37 ml/m RA Volume:   51.50 ml  24.92 ml/m LA Vol (A4C):   68.8 ml 33.29 ml/m LA Biplane Vol: 74.4 ml 36.00 ml/m  AORTIC VALVE AV Area (Vmax):    1.29 cm AV Area (Vmean):   1.25 cm AV Area (VTI):     1.26 cm AV Vmax:           231.00 cm/s AV Vmean:          157.500 cm/s AV VTI:             0.375 m AV Peak Grad:      21.3 mmHg AV Mean Grad:      11.0 mmHg LVOT Vmax:         105.00 cm/s LVOT Vmean:        69.200 cm/s LVOT VTI:          0.167 m LVOT/AV VTI ratio: 0.45 AI PHT:            359 msec  AORTA Ao Root diam: 3.00 cm Ao Asc diam:  4.20 cm MITRAL VALVE                 TRICUSPID VALVE MV Area (PHT): 3.60 cm      TR Peak grad:   23.8 mmHg MV Decel Time: 211 msec      TR Vmax:        244.00 cm/s MR Peak grad:    95.8 mmHg MR Mean grad:    59.5 mmHg   SHUNTS MR Vmax:         489.50 cm/s Systemic VTI:  0.17 m MR Vmean:        364.0 cm/s  Systemic Diam: 1.90 cm MR PISA:  1.01 cm MR PISA Eff ROA: 8 mm MR PISA Radius:  0.40 cm MV E velocity: 141.00 cm/s MV A velocity: 110.00 cm/s MV E/A ratio:  1.28 Mihai Croitoru MD Electronically signed by [name] MD Signature Date/Time: 06/21/2021/3:17:41 PM    Final     allopurinol  100 mg Oral Once per day on Mon Tue Thu Sat   bictegravir-emtricitabine-tenofovir AF  1 tablet Oral Daily   brimonidine  1 drop Right Eye BID   Chlorhexidine Gluconate Cloth  6 each Topical Q0600   midodrine  10 mg Oral 2 times per day on Tue Thu Sat   midodrine  5 mg Oral Once per day on Sun Mon Wed Fri   montelukast  10 mg Oral QHS   multivitamin  1 tablet Oral QPM   ofloxacin  1 drop Right Eye QID   pantoprazole (PROTONIX) IV  40 mg Intravenous Daily   sucroferric oxyhydroxide  500 mg Oral TID with meals    BMET    Component Value Date/Time   NA 138 06/22/2021 0235   NA 142 04/24/2018 1641   K 5.3 (H) 06/22/2021 0235   CL 93 (L) 06/22/2021 0235   CO2 27 06/22/2021 0235   GLUCOSE 126 (H) 06/22/2021 0235   BUN 44 (H) 06/22/2021 0235   BUN 31 (H) 04/24/2018 1641   CREATININE 7.51 (H) 06/22/2021 0235   CREATININE 7.71 (H) 03/19/2019 1140   CALCIUM 9.6 06/22/2021 0235   CALCIUM 8.6 03/14/2016 0925   GFRNONAA 7 (L) 06/22/2021 0235   GFRNONAA 6 (L) 03/19/2019 1140   GFRAA 8 (L) 05/18/2020 0614   GFRAA 7 (L) 03/19/2019 1140   CBC    Component  Value Date/Time   WBC 16.6 (H) 06/22/2021 0235   RBC 3.46 (L) 06/22/2021 0235   HGB 12.2 (L) 06/22/2021 0235   HGB 11.1 (L) 03/06/2013 1138   HCT 36.5 (L) 06/22/2021 0235   HCT 34.3 (L) 03/12/2019 0358   HCT 33.8 (L) 03/06/2013 1138   PLT 272 06/22/2021 0235   PLT 177 03/06/2013 1138   MCV 105.5 (H) 06/22/2021 0235   MCV 101.4 (A) 04/24/2018 1742   MCV 95 03/06/2013 1138   MCH 35.3 (H) 06/22/2021 0235   MCHC 33.4 06/22/2021 0235   RDW 15.1 06/22/2021 0235   RDW 13.4 03/06/2013 1138   LYMPHSABS 0.8 06/22/2021 0235   LYMPHSABS 1.5 03/06/2013 1138   MONOABS 2.3 (H) 06/22/2021 0235   EOSABS 0.0 06/22/2021 0235   EOSABS 0.3 03/06/2013 1138   BASOSABS 0.0 06/22/2021 0235   BASOSABS 0.0 03/06/2013 1138    Dialysis Orders:  [facility] at [facility]hr, 400/A1.5, EDW 82kg, 2K/2Ca, UFP #2, AVG, heparin 2000 bolus - Hectoral 47mcg IV q HD - Venofer 50mg  IV weekly - Mircera 19mcg IV q 2 weeks   Assessment/Plan:  Lethargy/weakness: Ammonia ok, CT/MRI without acute findings. No clear etiology at the moment.  ESRD:  Will plan for HD tomorrow to keep on his MTThS schedule. If any component of over-medication contributing to current state, hopefully will slightly improve with dialysis. Hyperkalemia - unclear why K is higher than pre-HD yesterday.  Will dose with lokelma 10 grams daily and follow.   Hypotension/volume: Chronically low BP (on midodrine) and with ^ BNP - UF as tolerated.  Able to UF 2 liters with HD  Anemia: Hgb 12.2- Hold esa for now.   Metabolic bone disease: Ca ok, Phos very high - needs to resume home binders once able to eat.  Nutrition:  Alb very low, to add supplements once eating.  HFrEF: Repeat echo pending.  HIV: Continue home meds.  A-fib: On Eliquis, amiodarone, cardiology following.   [name], MD [employer] [phone]

## 2021-06-22 NOTE — Progress Notes (Signed)
[place] for IV Heparin Indication: atrial fibrillation  Allergies  Allergen Reactions   Dextromethorphan-Guaifenesin Other (See Comments)    Unknown reaction   Tocotrienols Other (See Comments)    Unknown reaction   Losartan Potassium Other (See Comments)    Causes constipation    Patient Measurements: Height: 6\' 2"  (188 cm) Weight: 76.8 kg (169 lb 6.4 oz) IBW/kg (Calculated) : 82.2 Heparin Dosing Weight: 80.6 kg  Vital Signs: Temp: 99.1 F (37.3 C) (07/20 0130) Temp Source: Oral (07/20 0130) BP: 117/67 (07/20 0425) Pulse Rate: 76 (07/20 0425)  Labs: Recent Labs    06/21/2021 1700 06/17/2021 2105 06/21/21 0814 06/21/21 1136 06/21/21 1742 06/21/21 1843 06/22/21 0235 06/22/21 0617  HGB 10.7*   < > 9.8* 10.5*  --  12.1* 12.2*  --   HCT 32.9*   < > 29.6* 31.0*  --  37.8* 36.5*  --   PLT 281   < > 260  --   --  214 272  --   APTT 34  --   --   --   --   --  41* 38*  LABPROT 21.3*  --   --   --   --   --   --   --   INR 1.8*  --   --   --   --   --   --   --   HEPARINUNFRC  --   --   --   --   --   --   --  >1.10*  CREATININE 9.26*   < > 10.68*  --  7.23*  --  7.51*  --   TROPONINIHS 106*  --   --   --  127* 128*  --   --    < > = values in this interval not displayed.     Estimated Creatinine Clearance: 8.7 mL/min (A) (by C-G formula based on SCr of 7.51 mg/dL (H)).   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   Chronic lower back pain    CKD (chronic kidney disease)  stage V requiring chronic dialysis ([place])    COVID-19 12/2019   ESRD on dialysis [facility])    started 02/2018 [place]   GERD (gastroesophageal reflux disease)    Glaucoma    "I had surgery and dont have it any more"   Gout    daily RX (02/21/2018)   Heart murmur    "mild" per pt   Hepatitis    years ago   HIV (human immunodeficiency virus infection) ([place])    HIV infection ([place])    Hyperlipidemia    Hypertension    Nonischemic cardiomyopathy ([place])    Pneumonia 11/2017   Prostate cancer ([place])    hx of prostate; s/p radioactive seed implant 10/2009 Dr Janice Norrie   Sinus bradycardia    Sleep apnea    does not use a cpap   Stroke [facility])    "mini stroke" years ago  Wears glasses     Assessment: 80 year old man with ESRD (TTS HD) admitted on 06/07/2021. Pt has hx of atrial fibrillation, for which he was on apixaban 5 mg PO BID PTA (last dose at 0800 on 7/18, per med rec; pt has not rec'd any doses of apixaban since admission, unable to sit up and swallow pills). Pharmacy was consulted to dose IV heparin for atrial fibrillation.   Goal of Therapy:  Heparin level 0.3-0.7 units/ml aPTT= 66-102 Monitor platelets by anticoagulation protocol: Yes   Plan:  -Increase heparin to 1400 units/hr -aPTT in 8 hrs  Hildred Laser, PharmD Clinical Pharmacist **Pharmacist phone directory can now be found on [email or web address] ([email or web address]).  Listed under [place].

## 2021-06-22 NOTE — Progress Notes (Signed)
PROGRESS NOTE    David [name]  IPJ:[id number] DOB: June 01, 1941 DOA: 06/08/2021 PCP: [name], NP   Brief Narrative:  80 year old chronically ill-appearing male with a past medical history significant for but not limited to ESRD on hemodialysis M/T/T/S, Afib on [facility], history of gout, chronic hypotension, GERD, recent admission for right hip fracture status post repair presented from his SNF for generalized weakness and nausea.  Work-up in the ED revealed electrolyte disturbances, elevated BNP greater than 4000, mild pulmonary edema with concern for acute CHF and elevated troponin.  TRH was asked to admit this patient but patient was unable to provide a subjective history due to his lethargy.  He underwent head imaging with a head CT scan as well as a MRI.  Head CT done showed no acute intracranial findings but did show chronic microvascular ischemic changes and cerebral volume loss.  MRI done showed no acute intracranial abnormality and showed grade generalized volume loss and findings of chronic microvascular ischemia. On 7/19, after HD, was found to be in A. fib with RVR, cardiology consulted.  Patient admitted for further management.    Assessment & Plan:   Active Problems:   ESRD (end stage renal disease) on dialysis (HCC)   Acute on chronic systolic CHF (congestive heart failure), NYHA class 4 (HCC)   Atrial fibrillation with RVR (HCC)   Generalized weakness   A-fib (HCC)   Possible acute metabolic encephalopathy  Possible UTI Noted 100.8 fever on 06/21/2021, currently afebrile with leukocytosis UA with large leukocytes, greater than 50 WBC, UC pending BC x2 NGTD Chest x-ray unremarkable for any infection Procalcitonin pending Head CT and MRI brain done showed no acute intracranial findings but did show chronic microvascular ischemic changes and cerebral volume loss.   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 fib on Eliquis Currently in A. 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 Hemoglobin is stable No overt bleeding Iron supplementation by nephrology Daily CBC     DVT prophylaxis: Heparin drip Code Status: FULL CODE  Family Communication: Discussed with Wife at bedside on 06/22/2021 Disposition Plan: Pending further clinical improvement   Status is: Inpatient  The patient will require care spanning > 2 midnights and should be moved to inpatient because: Unsafe d/c plan, IV treatments appropriate due to intensity of illness or inability to take PO, and  Inpatient level of care appropriate due to severity of illness  Dispo:  Patient From: [place]  Planned Disposition: [place]  Medically stable for discharge: No       Consultants:   Cardiology Nephrology   Procedures: ECHOCARDIOGRAM IMPRESSIONS     1. Left ventricular ejection fraction, by estimation, is 20 to 25%. The  left ventricle has severely decreased function. The left ventricle  demonstrates global hypokinesis. The left ventricular internal cavity size  was severely dilated. Left ventricular  diastolic parameters are consistent with Grade II diastolic dysfunction  (pseudonormalization). Elevated left atrial pressure.   2. Right ventricular systolic function is mildly reduced. The right  ventricular size is severely enlarged. There is normal pulmonary artery  systolic pressure. The estimated right ventricular systolic pressure is  31.4 mmHg.   3. Left atrial size was moderately dilated.   4. Right atrial size was mildly dilated.   5. The mitral valve is normal in structure. Severe mitral valve  regurgitation.   6. The aortic valve is tricuspid. Aortic valve regurgitation is mild to  moderate. Mild to moderate aortic valve stenosis.   7. There is mild dilatation of the ascending aorta, measuring 42 mm.   8. The inferior vena cava is dilated in size with >50% respiratory  variability, suggesting right atrial pressure of 8 mmHg.   Comparison(s): Prior images reviewed side by side. The left ventricular  function is unchanged. The right ventricular systolic function is worse.  Mitral regurgitation appears more severe. There is systolic flow reversal  in the pulmonary veins. In a  hemodialysis patient, this may be due to different loading conditions.   FINDINGS   Left Ventricle: Left ventricular ejection fraction, by estimation, is 20  to 25%. The left ventricle has severely decreased function. The left  ventricle demonstrates global hypokinesis. The left ventricular internal  cavity size was severely dilated.  There is no left ventricular hypertrophy. Abnormal (paradoxical) septal  motion, consistent with left bundle branch block. Left ventricular   diastolic parameters are consistent with Grade II diastolic dysfunction  (pseudonormalization). Elevated left atrial   pressure.   Right Ventricle: The right ventricular size is severely enlarged. No  increase in right ventricular wall thickness. Right ventricular systolic  function is mildly reduced. There is normal pulmonary artery systolic  pressure. The tricuspid regurgitant  velocity is 2.44 m/s, and with an assumed right atrial pressure of 8 mmHg,  the estimated right ventricular systolic pressure is 97.0 mmHg.   Left Atrium: Left atrial size was moderately dilated.   Right Atrium: Right atrial size was mildly dilated.   Pericardium: Trivial pericardial effusion is present.   Mitral Valve: There is diastolic MR due to long AV delay/first degree AV  block. The mitral valve is normal in structure. Severe mitral valve  regurgitation, with centrally-directed jet.   Tricuspid Valve: The tricuspid valve is normal in structure. Tricuspid  valve regurgitation is trivial.   Aortic Valve: The aortic valve is tricuspid. Aortic valve regurgitation is  mild to moderate. Aortic regurgitation PHT measures 359 msec. Mild to  moderate aortic stenosis is present. Aortic valve mean gradient measures  11.0 mmHg. Aortic valve peak  gradient measures 21.3 mmHg. Aortic valve area, by VTI measures 1.26 cm.   Pulmonic Valve: The pulmonic valve was grossly normal. Pulmonic valve  regurgitation is not visualized.   Aorta: The aortic root is normal in size and structure. There is mild  dilatation of the ascending aorta, measuring 42  mm.   Venous: A pattern of systolic flow reversal, suggestive of severe mitral  regurgitation is recorded from the right upper pulmonary vein. The  inferior vena cava is dilated in size with greater than 50% respiratory  variability, suggesting right atrial  pressure of 8 mmHg.   IAS/Shunts: No atrial level shunt detected by color flow Doppler.      LEFT  VENTRICLE  PLAX 2D  LVIDd:         7.07 cm  Diastology  LVIDs:         6.20 cm  LV e' medial:    4.03 cm/s  LV PW:         1.60 cm  LV E/e' medial:  35.0  LV IVS:        1.00 cm  LV e' lateral:   6.42 cm/s  LVOT diam:     1.90 cm  LV E/e' lateral: 22.0  LV SV:         47  LV SV Index:   23  LVOT Area:     2.84 cm      RIGHT VENTRICLE             IVC  RV Basal diam:  3.80 cm     IVC diam: 2.10 cm  RV Mid diam:    2.80 cm  RV S prime:     11.60 cm/s  TAPSE (M-mode): 2.0 cm   LEFT ATRIUM             Index       RIGHT ATRIUM           Index  LA diam:        4.30 cm 2.08 cm/m  RA Area:     18.40 cm  LA Vol (A2C):   79.3 ml 38.37 ml/m RA Volume:   51.50 ml  24.92 ml/m  LA Vol (A4C):   68.8 ml 33.29 ml/m  LA Biplane Vol: 74.4 ml 36.00 ml/m   AORTIC VALVE  AV Area (Vmax):    1.29 cm  AV Area (Vmean):   1.25 cm  AV Area (VTI):     1.26 cm  AV Vmax:           231.00 cm/s  AV Vmean:          157.500 cm/s  AV VTI:            0.375 m  AV Peak Grad:      21.3 mmHg  AV Mean Grad:      11.0 mmHg  LVOT Vmax:         105.00 cm/s  LVOT Vmean:        69.200 cm/s  LVOT VTI:          0.167 m  LVOT/AV VTI ratio: 0.45  AI PHT:            359 msec     AORTA  Ao Root diam: 3.00 cm  Ao Asc diam:  4.20 cm   MITRAL VALVE                 TRICUSPID VALVE  MV Area (PHT): 3.60 cm      TR Peak grad:   23.8 mmHg  MV Decel Time: 211 msec      TR Vmax:        244.00 cm/s  MR Peak grad:    95.8 mmHg  MR Mean grad:    59.5 mmHg  SHUNTS  MR Vmax:         489.50 cm/s Systemic VTI:  0.17 m  MR Vmean:        364.0 cm/s  Systemic Diam: 1.90 cm  MR PISA:         1.01 cm  MR PISA Eff ROA: 8 mm  MR PISA Radius:  0.40 cm  MV E velocity: 141.00 cm/s  MV A velocity: 110.00 cm/s  MV E/A ratio:  1.28   Antimicrobials:  Anti-infectives (From admission, onward)    Start     Dose/Rate Route Frequency Ordered Stop   06/21/21 2315  cefTRIAXone (ROCEPHIN) 2 g in sodium chloride 0.9 % 100 mL IVPB         2 g 200 mL/hr over 30 Minutes Intravenous Every 24 hours 06/21/21 2227     06/21/21 1000  bictegravir-emtricitabine-tenofovir AF ([name]) 50-200-25 MG per tablet 1 tablet        1 tablet Oral Daily 06/09/2021 2213          Subjective: Patient noted to be very deconditioned, noted to have improved slightly in mentation, alert, awake, oriented x4.  Denies any pain any chest pain, worsening shortness of breath, abdominal pain, nausea/vomiting, fever/chills.  Discussed extensively with patient's wife at bedside.  Objective: Vitals:   06/22/21 0200 06/22/21 0408 06/22/21 0425 06/22/21 1112  BP: 132/70  117/67 (!) 115/103  Pulse:   76 99  Resp: 20  16 18   Temp:    98.9 F (37.2 C)  TempSrc:    Oral  SpO2: 100%  98% 100%  Weight:  76.8 kg    Height:        Intake/Output Summary (Last 24 hours) at 06/22/2021 1723 Last data filed at 06/22/2021 1400 Gross per 24 hour  Intake 1096.96 ml  Output 20 ml  Net 1076.96 ml   Filed Weights   06/27/2021 1530 06/22/21 0017 06/22/21 0408  Weight: 80.6 kg 76.8 kg 76.8 kg   Examination: Physical Exam: General: NAD, very deconditioned, chronically ill-appearing Cardiovascular: S1, S2 present Respiratory: Diminished breath sounds bilaterally Abdomen: Soft, nontender, nondistended, bowel sounds present Musculoskeletal: No bilateral pedal edema noted Skin: Normal Psychiatry: Normal mood     Data Reviewed: I have personally reviewed following labs and imaging studies  CBC: Recent Labs  Lab 07/03/2021 1700 06/05/2021 2105 06/21/21 0814 06/21/21 1136 06/21/21 1843 06/22/21 0235  WBC 9.3 9.7 10.3  --  17.4* 16.6*  NEUTROABS 7.4  --  7.9*  --  15.2* 13.4*  HGB 10.7* 10.8* 9.8* 10.5* 12.1* 12.2*  HCT 32.9* 33.4* 29.6* 31.0* 37.8* 36.5*  MCV 108.2* 110.2* 108.4*  --  110.5* 105.5*  PLT 281 260 260  --  214 371   Basic Metabolic Panel: Recent Labs  Lab 06/16/21 0747 06/15/2021 1700 06/10/2021 2105 06/21/21 0814 06/21/21 1136  06/21/21 1742 06/22/21 0235  NA 135 137  --  137 137 138 138  K 4.3 5.4*  --  5.7* 5.4* 5.1 5.3*  CL 96* 93*  --  94*  --  94* 93*  CO2 24 27  --  28  --  26 27  GLUCOSE 127* 114*  --  99  --  94 126*  BUN 60* 56*  --  75*  --  44* 44*  CREATININE 9.50* 9.26* 9.68* 10.68*  --  7.23* 7.51*  CALCIUM 8.9 8.9  --  8.9  --  9.5 9.6  MG  --  2.6*  --  2.7*  --  2.6* 2.6*  PHOS 6.9* 8.5*  --  10.3*  --  7.7* 8.2*   GFR: Estimated Creatinine Clearance: 8.7 mL/min (A) (by C-G formula based on SCr of 7.51 mg/dL (H)). Liver Function Tests: Recent Labs  Lab 06/16/21 0747 07/03/2021 1700 06/21/21 0814 06/21/21 1742 06/22/21 0235  AST  --  29 24 42* 51*  ALT  --  7 6 12 13   ALKPHOS  --  123 105 126 122  BILITOT  --  1.1 1.1 1.7* 1.0  PROT  --  7.0 6.4* 8.2* 7.6  ALBUMIN 3.3* 3.2* 2.9* 3.5 3.1*   No results for input(s): LIPASE, AMYLASE in the last 168 hours. Recent Labs  Lab 06/04/2021 1700  AMMONIA 31   Coagulation Profile: Recent Labs  Lab 06/19/2021 1700  INR 1.8*   Cardiac Enzymes: No results for input(s): CKTOTAL, CKMB, CKMBINDEX, TROPONINI in the last 168 hours. BNP (last 3 results) No results for input(s): PROBNP in the last 8760 hours. HbA1C: No results for input(s): HGBA1C in the last 72 hours. CBG: Recent Labs  Lab 06/03/2021 1529 06/27/2021 2212 06/21/21 0831  GLUCAP 116* 111* 110*   Lipid Profile: No results for input(s): CHOL, HDL, LDLCALC, TRIG, CHOLHDL, LDLDIRECT in the last 72 hours. Thyroid Function Tests: Recent Labs    06/22/21 0134  TSH 1.094   Anemia Panel: Recent Labs    06/22/21 0134  VITAMINB12 1,016*   Sepsis Labs: Recent Labs  Lab 06/19/2021 1700 06/27/2021 2105 06/21/21 2318 06/22/21 0235  LATICACIDVEN 1.8 1.6 1.8 1.7    Recent Results (from the past 240 hour(s))  Resp Panel by RT-PCR (Flu A&B, Covid) Nasopharyngeal Swab     Status: None   Collection Time: 06/16/21  5:04 PM   Specimen: Nasopharyngeal Swab; Nasopharyngeal(NP) swabs in  vial transport medium  Result Value Ref Range Status   SARS Coronavirus 2 by RT PCR NEGATIVE NEGATIVE Final    Comment: (NOTE) SARS-CoV-2 target nucleic acids are NOT DETECTED.  The SARS-CoV-2 RNA is generally detectable in upper respiratory specimens during the acute phase of infection. The lowest concentration of SARS-CoV-2 viral copies this assay can detect is 138 copies/mL. A negative result does not preclude SARS-Cov-2 infection and should not be used as the sole basis for treatment or other patient management decisions. A negative result may occur with  improper specimen collection/handling, submission of specimen other than nasopharyngeal swab, presence of viral mutation(s) within the areas targeted by this assay, and inadequate number of viral copies(<138 copies/mL). A negative result must be combined with clinical observations, patient history, and epidemiological information. The expected result is Negative.  Fact Sheet for Patients:  EntrepreneurPulse.com.au  Fact Sheet for Healthcare Providers:  IncredibleEmployment.be  This test is no t yet approved or cleared by the [place] FDA and  has been authorized for detection and/or diagnosis of SARS-CoV-2 by FDA under an Emergency Use Authorization (EUA). This EUA will remain  in effect (meaning this test can be used) for the duration of the COVID-19 declaration under Section 564(b)(1) of the Act, 21 U.S.C.section 360bbb-3(b)(1), unless the authorization is terminated  or revoked sooner.       Influenza A by PCR NEGATIVE NEGATIVE Final   Influenza B by PCR NEGATIVE NEGATIVE Final    Comment: (NOTE) The Xpert Xpress SARS-CoV-2/FLU/RSV plus assay is intended as an aid in the diagnosis of influenza from Nasopharyngeal swab specimens and should not be used as a sole basis for treatment. Nasal washings and aspirates are unacceptable for  Xpert Xpress SARS-CoV-2/FLU/RSV testing.  Fact  Sheet for Patients: EntrepreneurPulse.com.au  Fact Sheet for Healthcare Providers: IncredibleEmployment.be  This test is not yet approved or cleared by the [place] FDA and has been authorized for detection and/or diagnosis of SARS-CoV-2 by FDA under an Emergency Use Authorization (EUA). This EUA will remain in effect (meaning this test can be used) for the duration of the COVID-19 declaration under Section 564(b)(1) of the Act, 21 U.S.C. section 360bbb-3(b)(1), unless the authorization is terminated or revoked.  Performed at [facility], [place] [address] [place], [place] [address]   Blood culture (routine x 2)     Status: None (Preliminary result)   Collection Time: 06/05/2021  3:53 PM   Specimen: BLOOD  Result Value Ref Range Status   Specimen Description BLOOD SITE NOT SPECIFIED  Final   Special Requests   Final    BOTTLES DRAWN AEROBIC AND ANAEROBIC Blood Culture results may not be optimal due to an excessive volume of blood received in culture bottles IV START   Culture   Final    NO GROWTH 2 DAYS Performed at [facility], [place] [address] [place], [place] [address]    Report Status PENDING  Incomplete  SARS CORONAVIRUS 2 (TAT 6-24 HRS) Nasopharyngeal Nasopharyngeal Swab     Status: None   Collection Time: 06/27/2021  4:50 PM   Specimen: Nasopharyngeal Swab  Result Value Ref Range Status   SARS Coronavirus 2 NEGATIVE NEGATIVE Final    Comment: (NOTE) SARS-CoV-2 target nucleic acids are NOT DETECTED.  The SARS-CoV-2 RNA is generally detectable in upper and lower respiratory specimens during the acute phase of infection. Negative results do not preclude SARS-CoV-2 infection, do not rule out co-infections with other pathogens, and should not be used as the sole basis for treatment or other patient management decisions. Negative results must be combined with clinical observations, patient history, and epidemiological  information. The expected result is Negative.  Fact Sheet for Patients: SugarRoll.be  Fact Sheet for Healthcare Providers: https://www.woods-mathews.com/  This test is not yet approved or cleared by the [place] FDA and  has been authorized for detection and/or diagnosis of SARS-CoV-2 by FDA under an Emergency Use Authorization (EUA). This EUA will remain  in effect (meaning this test can be used) for the duration of the COVID-19 declaration under Se ction 564(b)(1) of the Act, 21 U.S.C. section 360bbb-3(b)(1), unless the authorization is terminated or revoked sooner.  Performed at [facility], [place] [address] [place], [place] [address]   Blood culture (routine x 2)     Status: None (Preliminary result)   Collection Time: 06/03/2021  9:08 PM   Specimen: BLOOD  Result Value Ref Range Status   Specimen Description BLOOD LEFT HAND  Final   Special Requests   Final    BOTTLES DRAWN AEROBIC AND ANAEROBIC Blood Culture adequate volume   Culture   Final    NO GROWTH 2 DAYS Performed at [facility], [place] [address] [place],  [address]    Report Status PENDING  Incomplete    RN Pressure Injury Documentation:     Estimated body mass index is 21.75 kg/m as calculated from the following:   Height as of this encounter: 6\' 2"  (1.88 m).   Weight as of this encounter: 76.8 kg.  Malnutrition Type: Nutrition Problem: Increased nutrient needs Etiology: chronic illness (ESRD on HD) Malnutrition Characteristics: Signs/Symptoms: estimated needs Nutrition Interventions: Interventions: MVI, Prostat, Magic cup  Radiology Studies: MR Brain Wo Contrast (neuro protocol)  Result Date: 07/02/2021 CLINICAL DATA:  Encephalopathy EXAM: MRI HEAD WITHOUT CONTRAST TECHNIQUE: Multiplanar, multiecho pulse sequences of the brain and surrounding structures were obtained without intravenous contrast. COMPARISON:  01/07/2019 FINDINGS: Brain:  No acute infarct, mass effect or extra-axial collection. No acute or chronic hemorrhage. Hyperintense T2-weighted signal is moderately widespread throughout the white matter. Generalized volume loss without a clear lobar predilection. The midline structures are normal. Vascular: Major flow voids are preserved. Skull and upper cervical spine: Normal calvarium and skull base. Visualized upper cervical spine and soft tissues are normal. Sinuses/Orbits:No paranasal sinus fluid levels or advanced mucosal thickening. No mastoid or middle ear effusion. Normal orbits. IMPRESSION: 1. No acute intracranial abnormality. 2. Generalized volume loss and findings of chronic microvascular ischemia. Electronically Signed   By: Ulyses Jarred M.D.   On: 07/01/2021 23:20   DG CHEST PORT 1 VIEW  Result Date: 06/22/2021 CLINICAL DATA:  Shortness of breath. EXAM: PORTABLE CHEST 1 VIEW COMPARISON:  06/21/2021. FINDINGS: Stable severe cardiomegaly. No pulmonary venous congestion. No focal infiltrate. No pleural effusion or pneumothorax. Degenerative change thoracic spine. IMPRESSION: 1.  Stable cardiomegaly.  No pulmonary venous congestion. 2.  No acute pulmonary disease. Electronically Signed   By: Marcello Moores  Register   On: 06/22/2021 06:28   DG CHEST PORT 1 VIEW  Result Date: 06/21/2021 CLINICAL DATA:  Fever. EXAM: PORTABLE CHEST 1 VIEW COMPARISON:  Radiograph yesterday. FINDINGS: Stable enlarged cardiac silhouette. Unchanged mediastinal contours. Slight increasing vascular congestion. No confluent consolidation. Minor right lung base atelectasis. No pneumothorax or pleural effusion. No acute osseous abnormalities. IMPRESSION: 1. Stable cardiomegaly. Slight increasing vascular congestion. 2. Minor right lung base atelectasis. Electronically Signed   By: Keith Rake M.D.   On: 06/21/2021 22:48   EEG adult  Result Date: 06/22/2021 Lora Havens, MD     06/22/2021  3:34 PM Patient Name: David [name] MRN: [id number]  Epilepsy Attending: Lora Havens Referring Physician/Provider: Dr Carlisle Cater Date: 06/22/2021 Duration: 24.32 mins Patient history: 80 year old male with altered mental status.  EEG to evaluate for seizures. Level of alertness: Awake, asleep AEDs during EEG study: None Technical aspects: This EEG study was done with scalp electrodes positioned according to the 10-20 International system of electrode placement. Electrical activity was acquired at a sampling rate of 500Hz  and reviewed with a high frequency filter of 70Hz  and a low frequency filter of 1Hz . EEG data were recorded continuously and digitally stored. Description: No clear posterior dominant rhythm was seen. Sleep was characterized by vertex waves, sleep spindles (12 to 14 Hz), maximal frontocentral region.  EEG also showed continuous 6-8 Hz theta-alpha activity as well as intermittent generalized 2 to 3 Hz delta slowing.  Hyperventilation and photic stimulation were not performed.   ABNORMALITY - Continuous slow, generalized IMPRESSION: This study is suggestive of moderate diffuse encephalopathy, nonspecific etiology. No seizures or epileptiform discharges were seen throughout the recording. Lora Havens   ECHOCARDIOGRAM COMPLETE  Result Date: 06/21/2021    ECHOCARDIOGRAM REPORT   Patient Name:   David [name] Date of Exam: 06/21/2021 Medical Rec #:  [id number]            Height:       74.0 in Accession #:    [id number]           Weight:       177.7 lb Date of Birth:  19-Dec-1940  BSA:          2.067 m Patient Age:    24 years             BP:           126/66 mmHg Patient Gender: M                    HR:           89 bpm. Exam Location:  Inpatient Procedure: 2D Echo, Cardiac Doppler and Color Doppler Indications:    I50.23 Acute on chronic systolic (congestive) heart failure  History:        Patient has prior history of Echocardiogram examinations, most                 recent 12/01/2020. Cardiomyopathy, Stroke,  Arrythmias:Atrial                 Fibrillation, Signs/Symptoms:Murmur; Risk Factors:Hypertension,                 Dyslipidemia and Sleep Apnea. ESRD. COVID-19. GERD.  Sonographer:    Jonelle Sidle Dance Referring Phys: [id number] [name]  1. Left ventricular ejection fraction, by estimation, is 20 to 25%. The left ventricle has severely decreased function. The left ventricle demonstrates global hypokinesis. The left ventricular internal cavity size was severely dilated. Left ventricular diastolic parameters are consistent with Grade II diastolic dysfunction (pseudonormalization). Elevated left atrial pressure.  2. Right ventricular systolic function is mildly reduced. The right ventricular size is severely enlarged. There is normal pulmonary artery systolic pressure. The estimated right ventricular systolic pressure is 76.5 mmHg.  3. Left atrial size was moderately dilated.  4. Right atrial size was mildly dilated.  5. The mitral valve is normal in structure. Severe mitral valve regurgitation.  6. The aortic valve is tricuspid. Aortic valve regurgitation is mild to moderate. Mild to moderate aortic valve stenosis.  7. There is mild dilatation of the ascending aorta, measuring 42 mm.  8. The inferior vena cava is dilated in size with >50% respiratory variability, suggesting right atrial pressure of 8 mmHg. Comparison(s): Prior images reviewed side by side. The left ventricular function is unchanged. The right ventricular systolic function is worse. Mitral regurgitation appears more severe. There is systolic flow reversal in the pulmonary veins. In a hemodialysis patient, this may be due to different loading conditions. FINDINGS  Left Ventricle: Left ventricular ejection fraction, by estimation, is 20 to 25%. The left ventricle has severely decreased function. The left ventricle demonstrates global hypokinesis. The left ventricular internal cavity size was severely dilated. There is no left ventricular  hypertrophy. Abnormal (paradoxical) septal motion, consistent with left bundle branch block. Left ventricular diastolic parameters are consistent with Grade II diastolic dysfunction (pseudonormalization). Elevated left atrial  pressure. Right Ventricle: The right ventricular size is severely enlarged. No increase in right ventricular wall thickness. Right ventricular systolic function is mildly reduced. There is normal pulmonary artery systolic pressure. The tricuspid regurgitant velocity is 2.44 m/s, and with an assumed right atrial pressure of 8 mmHg, the estimated right ventricular systolic pressure is 46.5 mmHg. Left Atrium: Left atrial size was moderately dilated. Right Atrium: Right atrial size was mildly dilated. Pericardium: Trivial pericardial effusion is present. 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 The mitral valve is normal in structure. Severe mitral valve regurgitation, with centrally-directed jet. Tricuspid Valve: The tricuspid valve is normal in structure. Tricuspid valve regurgitation is trivial.  Aortic Valve: The aortic valve is tricuspid. Aortic valve regurgitation is mild to moderate. Aortic regurgitation PHT measures 359 msec. Mild to moderate aortic stenosis is present. Aortic valve mean gradient measures 11.0 mmHg. Aortic valve peak gradient measures 21.3 mmHg. Aortic valve area, by VTI measures 1.26 cm. Pulmonic Valve: The pulmonic valve was grossly normal. Pulmonic valve regurgitation is not visualized. Aorta: The aortic root is normal in size and structure. There is mild dilatation of the ascending aorta, measuring 42 mm. Venous: A pattern of systolic flow reversal, suggestive of severe mitral regurgitation is recorded from the right upper pulmonary vein. The inferior vena cava is dilated in size with greater than 50% respiratory variability, suggesting right atrial pressure of 8 mmHg. IAS/Shunts: No atrial level shunt detected by color flow  Doppler.  LEFT VENTRICLE PLAX 2D LVIDd:         7.07 cm  Diastology LVIDs:         6.20 cm  LV e' medial:    4.03 cm/s LV PW:         1.60 cm  LV E/e' medial:  35.0 LV IVS:        1.00 cm  LV e' lateral:   6.42 cm/s LVOT diam:     1.90 cm  LV E/e' lateral: 22.0 LV SV:         47 LV SV Index:   23 LVOT Area:     2.84 cm  RIGHT VENTRICLE             IVC RV Basal diam:  3.80 cm     IVC diam: 2.10 cm RV Mid diam:    2.80 cm RV S prime:     11.60 cm/s TAPSE (M-mode): 2.0 cm LEFT ATRIUM             Index       RIGHT ATRIUM           Index LA diam:        4.30 cm 2.08 cm/m  RA Area:     18.40 cm LA Vol (A2C):   79.3 ml 38.37 ml/m RA Volume:   51.50 ml  24.92 ml/m LA Vol (A4C):   68.8 ml 33.29 ml/m LA Biplane Vol: 74.4 ml 36.00 ml/m  AORTIC VALVE AV Area (Vmax):    1.29 cm AV Area (Vmean):   1.25 cm AV Area (VTI):     1.26 cm AV Vmax:           231.00 cm/s AV Vmean:          157.500 cm/s AV VTI:            0.375 m AV Peak Grad:      21.3 mmHg AV Mean Grad:      11.0 mmHg LVOT Vmax:         105.00 cm/s LVOT Vmean:        69.200 cm/s LVOT VTI:          0.167 m LVOT/AV VTI ratio: 0.45 AI PHT:            359 msec  AORTA Ao Root diam: 3.00 cm Ao Asc diam:  4.20 cm MITRAL VALVE                 TRICUSPID VALVE MV Area (PHT): 3.60 cm      TR Peak grad:   23.8 mmHg MV Decel Time: 211 msec      TR Vmax:  244.00 cm/s MR Peak grad:    95.8 mmHg MR Mean grad:    59.5 mmHg   SHUNTS MR Vmax:         489.50 cm/s Systemic VTI:  0.17 m MR Vmean:        364.0 cm/s  Systemic Diam: 1.90 cm MR PISA:         1.01 cm MR PISA Eff ROA: 8 mm MR PISA Radius:  0.40 cm MV E velocity: 141.00 cm/s MV A velocity: 110.00 cm/s MV E/A ratio:  1.28 Mihai Croitoru MD Electronically signed by [name] MD Signature Date/Time: 06/21/2021/3:17:41 PM    Final     Scheduled Meds:  (feeding supplement) PROSource Plus  30 mL Oral TID BM   allopurinol  100 mg Oral Once per day on Mon Tue Thu Sat   bictegravir-emtricitabine-tenofovir AF  1  tablet Oral Daily   brimonidine  1 drop Right Eye BID   Chlorhexidine Gluconate Cloth  6 each Topical Q0600   midodrine  10 mg Oral 2 times per day on Tue Thu Sat   midodrine  5 mg Oral Once per day on Sun Mon Wed Fri   montelukast  10 mg Oral QHS   multivitamin  1 tablet Oral QPM   ofloxacin  1 drop Right Eye QID   pantoprazole (PROTONIX) IV  40 mg Intravenous Daily   sodium zirconium cyclosilicate  10 g Oral Daily   sucroferric oxyhydroxide  500 mg Oral TID with meals   Continuous Infusions:  amiodarone 30 mg/hr (06/22/21 1549)   cefTRIAXone (ROCEPHIN)  IV 2 g (06/21/21 2308)   heparin 1,400 Units/hr (06/22/21 0914)    LOS: 0 days   Alma Friendly, MD [facility] PAGER is on AMION  If 7PM-7AM, please contact night-coverage [email or web address]

## 2021-06-22 NOTE — Evaluation (Signed)
Physical Therapy Evaluation Patient Details Name: David [name] MRN: [id number] DOB: 1940-12-20 Today's Date: 06/22/2021   History of Present Illness  Pt is a 80 y.o. male recently admitted after fall s/p R hip pinning 06/12/21 (to be 50% PWB) with d/c to [facility], now admitted from HD on 06/15/2021 with generalized weakness, nausea, ongoing lethargy; unknown etiology. CXR with no acute disease. Brain MRI negative for acute abnormality. EEG on 7/20 suggestive of moderate diffuse encephalopathy; no seizures noted. PMH includes L hip OA, ESRD (HD TTS), HTN, CKD V, afib, stroke, HIV, bradycardia, cardiomyopathy, prostate CA, glaucoma, gout, COVID-19.   Clinical Impression  Pt presents with an overall decrease in functional mobility secondary to above. PTA, pt with recent admit after fall s/p R hip pinning (06/12/21) and d/c to SNF for post-acute rehab. Pt poor historian this session; per chart review, pt was mod indep with RW and living with wife prior to initial admission. Today, pt alert and able to state his name; otherwise, pt provided minimal answers to questions; pt inconsistent with command following. Pt required totalA for bed mobility and R hip/knee ROM; dependent for pericare and feeding; expect pt to require +2 assist and lift equipment to progress OOB. Pt would benefit from continued SNF-level therapies to maximize functional mobility and decrease caregiver burden. Will follow acutely to address established goals.    Follow Up Recommendations SNF;Supervision for mobility/OOB    Equipment Recommendations  Wheelchair;Wheelchair cushion;Hospital bed; Passenger transport manager for [employer]       Precautions / Restrictions Precautions Precautions: Fall;Other (comment) Precaution Comments: Bladder/bowel incontinence Restrictions Weight Bearing Restrictions: Yes RLE Weight Bearing: Partial weight bearing RLE Partial Weight Bearing Percentage or Pounds: 50%       Mobility  Bed Mobility Overal bed mobility: Needs Assistance Bed Mobility: Rolling Rolling: Total assist;+2 for physical assistance;+2 for safety/equipment         General bed mobility comments: Pt following minimal commands related to mobility sequencing; rolling R/L for pericare/linen change, pt requiring totalA for trunk rotation and LE positioning, initially attempting to assist with UE support on rail when cued, but ultimately unable to complete this; unable to progress to sitting this session    Transfers                 General transfer comment:  (Will likely require +2 assist and lift equipment)  Ambulation/Gait                Stairs            Wheelchair Mobility    Modified Rankin (Stroke Patients Only)       Balance                                             Pertinent Vitals/Pain Pain Assessment: Faces Faces Pain Scale: Hurts even more Pain Location: RLE with mobility/ROM Pain Descriptors / Indicators: Operative site guarding;Grimacing;Guarding Pain Intervention(s): Monitored during session;Limited activity within patient's tolerance;Repositioned    Home Living Family/patient expects to be discharged to:: Skilled nursing facility                 Additional Comments: Patient recently admitted with d/c to [facility] SNF for rehab on 06/17/21. Per chart, pt originally from home with spouse    Prior Function Level of Independence: Needs assistance   Gait /  Transfers Assistance Needed: Pt poor historian, but sounds like he has not stood since admit to [facility] 06/17/21. Per chart, prior to fall leading to admission, pt mod indep ambulator with RW  ADL's / Homemaking Assistance Needed: Likely assist for all ADL tasks from SNF staff. Per chart, pt typically mod indep with toileting and bathing; wife assisted with lower body dressing        Hand Dominance        Extremity/Trunk Assessment   Upper Extremity  Assessment Upper Extremity Assessment: Generalized weakness;Difficult to assess due to impaired cognition    Lower Extremity Assessment Lower Extremity Assessment: Generalized weakness;RLE deficits/detail;Difficult to assess due to impaired cognition RLE Deficits / Details: Able to wiggle toes, partial range AROM ankle DF/PF; minimal to no active movement noted for knee flex/ext or hip; fairly good hip/knee PROM, some grimacing with this       Communication   Communication: Expressive difficulties  Cognition Arousal/Alertness: Lethargic Behavior During Therapy: Flat affect Overall Cognitive Status: No family/caregiver present to determine baseline cognitive functioning                                 General Comments: Pt with wide eyes open the majority of session; able to answer first name and respond minimally to a few questions; inconsistently following one-step commands with increased time; did ask, "Am I clean?" after bed mobility for pericare      General Comments General comments (skin integrity, edema, etc.): Pt aware of bowel incontinence, but unable to make needs known unless specifically asked; pt dependent for pericare/linen change; pt dependent for meal set-up and eating. Requested RN order bilateral prevalon boots to encourage ankle DF and decrease pressure injury risk on heels    Exercises Other Exercises Other Exercises: RLE PROM - hip abd/add, hip flex, knee flex/ext - pt with fair tolerance, some grimacing noted with hip ROM   Assessment/Plan    PT Assessment Patient needs continued PT services  PT Problem List Decreased strength;Decreased range of motion;Decreased activity tolerance;Decreased balance;Decreased mobility;Decreased knowledge of use of DME;Decreased cognition;Decreased safety awareness;Decreased knowledge of precautions;Decreased skin integrity;Pain       PT Treatment Interventions DME instruction;Gait training;Stair training;Functional  mobility training;Therapeutic activities;Therapeutic exercise;Balance training;Patient/family education;Wheelchair mobility training;Cognitive remediation    PT Goals (Current goals can be found in the Care Plan section)  Acute Rehab PT Goals Patient Stated Goal: "Am I clean?" PT Goal Formulation: With patient Time For Goal Achievement: 07/06/21 Potential to Achieve Goals: Fair    Frequency Min 2X/week   Barriers to discharge        Co-evaluation               AM-PAC PT "6 Clicks" Mobility  Outcome Measure Help needed turning from your back to your side while in a flat bed without using bedrails?: Total Help needed moving from lying on your back to sitting on the side of a flat bed without using bedrails?: Total Help needed moving to and from a bed to a chair (including a wheelchair)?: Total Help needed standing up from a chair using your arms (e.g., wheelchair or bedside chair)?: Total Help needed to walk in hospital room?: Total Help needed climbing 3-5 steps with a railing? : Total 6 Click Score: 6    End of Session   Activity Tolerance: Patient limited by lethargy Patient left: in bed;with call bell/phone within reach;with bed alarm set;with nursing/sitter in room  Nurse Communication: Mobility status PT Visit Diagnosis: Muscle weakness (generalized) (M62.81);Pain;Other abnormalities of gait and mobility (R26.89) Pain - Right/Left: Right Pain - part of body: Leg    Time: 3709-6438 PT Time Calculation (min) (ACUTE ONLY): 17 min   Charges:   PT Evaluation $PT Eval Moderate Complexity: 1 Mod     Mabeline Caras, PT, DPT Acute Rehabilitation Services  Pager [phone] Office [place] 06/22/2021, 5:45 PM

## 2021-06-22 NOTE — Progress Notes (Signed)
Cardiology Progress Note  Patient ID: David [name] MRN: [id number] DOB: 1941-09-26 Date of Encounter: 06/22/2021  Primary Cardiologist: [name], MD  Subjective   Chief Complaint: Confusion  HPI: Awake, alert, not answering questions.  Hemodynamically stable.  Went back into sinus rhythm but now in A. fib this morning.  Euvolemic on examination.  ROS:  All other ROS reviewed and negative. Pertinent positives noted in the HPI.     Inpatient Medications  Scheduled Meds:  allopurinol  100 mg Oral Once per day on Mon Tue Thu Sat   bictegravir-emtricitabine-tenofovir AF  1 tablet Oral Daily   brimonidine  1 drop Right Eye BID   Chlorhexidine Gluconate Cloth  6 each Topical Q0600   midodrine  10 mg Oral 2 times per day on Tue Thu Sat   midodrine  5 mg Oral Once per day on Sun Mon Wed Fri   montelukast  10 mg Oral QHS   multivitamin  1 tablet Oral QPM   ofloxacin  1 drop Right Eye QID   pantoprazole  40 mg Oral Daily   sucroferric oxyhydroxide  500 mg Oral TID with meals   Continuous Infusions:  amiodarone 30 mg/hr (06/22/21 0340)   cefTRIAXone (ROCEPHIN)  IV 2 g (06/21/21 2308)   heparin 1,150 Units/hr (06/21/21 2254)   PRN Meds: acetaminophen, acetaminophen, oxyCODONE, polyethylene glycol, prochlorperazine   Vital Signs   Vitals:   06/22/21 0130 06/22/21 0200 06/22/21 0408 06/22/21 0425  BP:  132/70  117/67  Pulse:    76  Resp: 20 20  16   Temp: 99.1 F (37.3 C)     TempSrc: Oral     SpO2:  100%  98%  Weight:   76.8 kg   Height:        Intake/Output Summary (Last 24 hours) at 06/22/2021 0807 Last data filed at 06/22/2021 0400 Gross per 24 hour  Intake 568.51 ml  Output 2000 ml  Net -1431.49 ml   Last 3 Weights 06/22/2021 06/22/2021 07/03/2021  Weight (lbs) 169 lb 6.4 oz 169 lb 6.4 oz 177 lb 11.1 oz  Weight (kg) 76.839 kg 76.839 kg 80.6 kg      Telemetry  Overnight telemetry shows sinus rhythm overnight, return of atrial fibrillation in the low 100s  this morning., which I personally reviewed.   ECG  The most recent ECG shows atrial fibrillation heart rate 131, left bundle branch block, which I personally reviewed.   Physical Exam   Vitals:   06/22/21 0130 06/22/21 0200 06/22/21 0408 06/22/21 0425  BP:  132/70  117/67  Pulse:    76  Resp: 20 20  16   Temp: 99.1 F (37.3 C)     TempSrc: Oral     SpO2:  100%  98%  Weight:   76.8 kg   Height:        Intake/Output Summary (Last 24 hours) at 06/22/2021 0807 Last data filed at 06/22/2021 0400 Gross per 24 hour  Intake 568.51 ml  Output 2000 ml  Net -1431.49 ml    Last 3 Weights 06/22/2021 06/22/2021 06/05/2021  Weight (lbs) 169 lb 6.4 oz 169 lb 6.4 oz 177 lb 11.1 oz  Weight (kg) 76.839 kg 76.839 kg 80.6 kg    Body mass index is 21.75 kg/m.   General: Ill-appearing Head: Atraumatic, normal size  Eyes: PEERLA, EOMI  Neck: Supple, no JVD Endocrine: No thryomegaly Cardiac: Normal S1, S2; irregular rhythm, tachycardia noted, 2 out of 6 systolic ejection murmur Lungs: Clear to  auscultation bilaterally, no wheezing, rhonchi or rales  Abd: Soft, nontender, no hepatomegaly  Ext: No edema, pulses 2+ Musculoskeletal: No deformities, BUE and BLE strength normal and equal Skin: Warm and dry, no rashes   Neuro: Alert, awake, not answering questions appropriately, no focal deficits  Labs  High Sensitivity Troponin:   Recent Labs  Lab 06/19/2021 1700 06/21/21 1742 06/21/21 1843  TROPONINIHS 106* 127* 128*     Cardiac EnzymesNo results for input(s): TROPONINI in the last 168 hours. No results for input(s): TROPIPOC in the last 168 hours.  Chemistry Recent Labs  Lab 06/21/21 0814 06/21/21 1136 06/21/21 1742 06/22/21 0235  NA 137 137 138 138  K 5.7* 5.4* 5.1 5.3*  CL 94*  --  94* 93*  CO2 28  --  26 27  GLUCOSE 99  --  94 126*  BUN 75*  --  44* 44*  CREATININE 10.68*  --  7.23* 7.51*  CALCIUM 8.9  --  9.5 9.6  PROT 6.4*  --  8.2* 7.6  ALBUMIN 2.9*  --  3.5 3.1*  AST 24  --   42* 51*  ALT 6  --  12 13  ALKPHOS 105  --  126 122  BILITOT 1.1  --  1.7* 1.0  GFRNONAA 4*  --  7* 7*  ANIONGAP 15  --  18* 18*    Hematology Recent Labs  Lab 06/21/21 0814 06/21/21 1136 06/21/21 1843 06/22/21 0235  WBC 10.3  --  17.4* 16.6*  RBC 2.73*  --  3.42* 3.46*  HGB 9.8* 10.5* 12.1* 12.2*  HCT 29.6* 31.0* 37.8* 36.5*  MCV 108.4*  --  110.5* 105.5*  MCH 35.9*  --  35.4* 35.3*  MCHC 33.1  --  32.0 33.4  RDW 15.4  --  15.3 15.1  PLT 260  --  214 272   BNP Recent Labs  Lab 06/07/2021 1700  BNP 4,128.9*    DDimer No results for input(s): DDIMER in the last 168 hours.   Radiology  CT Head Wo Contrast  Result Date: 07/01/2021 CLINICAL DATA:  Altered mental status EXAM: CT HEAD WITHOUT CONTRAST TECHNIQUE: Contiguous axial images were obtained from the base of the skull through the vertex without intravenous contrast. COMPARISON:  09/05/2019 FINDINGS: Brain: No evidence of acute infarction, hemorrhage, hydrocephalus, extra-axial collection or mass lesion/mass effect. Mild-moderate low-density changes within the periventricular and subcortical white matter compatible with chronic microvascular ischemic change. Mild diffuse cerebral volume loss with stable size and configuration of the ventricles. Vascular: No hyperdense vessel or unexpected calcification. Skull: Normal. Negative for fracture or focal lesion. Sinuses/Orbits: No acute finding. Other: None. IMPRESSION: 1. No acute intracranial findings. 2. Chronic microvascular ischemic change and cerebral volume loss. Electronically Signed   By: Davina Poke D.O.   On: 06/11/2021 17:04   MR Brain Wo Contrast (neuro protocol)  Result Date: 06/19/2021 CLINICAL DATA:  Encephalopathy EXAM: MRI HEAD WITHOUT CONTRAST TECHNIQUE: Multiplanar, multiecho pulse sequences of the brain and surrounding structures were obtained without intravenous contrast. COMPARISON:  01/07/2019 FINDINGS: Brain: No acute infarct, mass effect or extra-axial  collection. No acute or chronic hemorrhage. Hyperintense T2-weighted signal is moderately widespread throughout the white matter. Generalized volume loss without a clear lobar predilection. The midline structures are normal. Vascular: Major flow voids are preserved. Skull and upper cervical spine: Normal calvarium and skull base. Visualized upper cervical spine and soft tissues are normal. Sinuses/Orbits:No paranasal sinus fluid levels or advanced mucosal thickening. No mastoid or middle ear  effusion. Normal orbits. IMPRESSION: 1. No acute intracranial abnormality. 2. Generalized volume loss and findings of chronic microvascular ischemia. Electronically Signed   By: Ulyses Jarred M.D.   On: 06/27/2021 23:20   DG CHEST PORT 1 VIEW  Result Date: 06/22/2021 CLINICAL DATA:  Shortness of breath. EXAM: PORTABLE CHEST 1 VIEW COMPARISON:  06/21/2021. FINDINGS: Stable severe cardiomegaly. No pulmonary venous congestion. No focal infiltrate. No pleural effusion or pneumothorax. Degenerative change thoracic spine. IMPRESSION: 1.  Stable cardiomegaly.  No pulmonary venous congestion. 2.  No acute pulmonary disease. Electronically Signed   By: Marcello Moores  Register   On: 06/22/2021 06:28   DG CHEST PORT 1 VIEW  Result Date: 06/21/2021 CLINICAL DATA:  Fever. EXAM: PORTABLE CHEST 1 VIEW COMPARISON:  Radiograph yesterday. FINDINGS: Stable enlarged cardiac silhouette. Unchanged mediastinal contours. Slight increasing vascular congestion. No confluent consolidation. Minor right lung base atelectasis. No pneumothorax or pleural effusion. No acute osseous abnormalities. IMPRESSION: 1. Stable cardiomegaly. Slight increasing vascular congestion. 2. Minor right lung base atelectasis. Electronically Signed   By: Keith Rake M.D.   On: 06/21/2021 22:48   DG Chest Portable 1 View  Result Date: 06/22/2021 CLINICAL DATA:  Recent right hip fracture, lethargy, nausea EXAM: PORTABLE CHEST 1 VIEW COMPARISON:  06/10/2021 FINDINGS:  Single frontal view of the chest demonstrates an enlarged cardiac silhouette. There is chronic central vascular congestion without airspace disease, effusion, or pneumothorax. No acute bony abnormalities. IMPRESSION: 1. Stable enlarged cardiac silhouette and vascular congestion. No acute airspace disease. Electronically Signed   By: Randa Ngo M.D.   On: 06/03/2021 16:37   ECHOCARDIOGRAM COMPLETE  Result Date: 06/21/2021    ECHOCARDIOGRAM REPORT   Patient Name:   KERON NEENAN Sidney Date of Exam: 06/21/2021 Medical Rec #:  [id number]            Height:       74.0 in Accession #:    [id number]           Weight:       177.7 lb Date of Birth:  Apr 30, 1941            BSA:          2.067 m Patient Age:    89 years             BP:           126/66 mmHg Patient Gender: M                    HR:           89 bpm. Exam Location:  Inpatient Procedure: 2D Echo, Cardiac Doppler and Color Doppler Indications:    I50.23 Acute on chronic systolic (congestive) heart failure  History:        Patient has prior history of Echocardiogram examinations, most                 recent 12/01/2020. Cardiomyopathy, Stroke, Arrythmias:Atrial                 Fibrillation, Signs/Symptoms:Murmur; Risk Factors:Hypertension,                 Dyslipidemia and Sleep Apnea. ESRD. COVID-19. GERD.  Sonographer:    Jonelle Sidle Dance Referring Phys: [id number] [employer]  1. Left ventricular ejection fraction, by estimation, is 20 to 25%. The left ventricle has severely decreased function. The left ventricle demonstrates global hypokinesis. The left ventricular internal cavity size was severely dilated. Left ventricular diastolic parameters  are consistent with Grade II diastolic dysfunction (pseudonormalization). Elevated left atrial pressure.  2. Right ventricular systolic function is mildly reduced. The right ventricular size is severely enlarged. There is normal pulmonary artery systolic pressure. The estimated right ventricular systolic  pressure is 87.6 mmHg.  3. Left atrial size was moderately dilated.  4. Right atrial size was mildly dilated.  5. The mitral valve is normal in structure. Severe mitral valve regurgitation.  6. The aortic valve is tricuspid. Aortic valve regurgitation is mild to moderate. Mild to moderate aortic valve stenosis.  7. There is mild dilatation of the ascending aorta, measuring 42 mm.  8. The inferior vena cava is dilated in size with >50% respiratory variability, suggesting right atrial pressure of 8 mmHg. Comparison(s): Prior images reviewed side by side. The left ventricular function is unchanged. The right ventricular systolic function is worse. Mitral regurgitation appears more severe. There is systolic flow reversal in the pulmonary veins. In a hemodialysis patient, this may be due to different loading conditions. FINDINGS  Left Ventricle: Left ventricular ejection fraction, by estimation, is 20 to 25%. The left ventricle has severely decreased function. The left ventricle demonstrates global hypokinesis. The left ventricular internal cavity size was severely dilated. There is no left ventricular hypertrophy. Abnormal (paradoxical) septal motion, consistent with left bundle branch block. Left ventricular diastolic parameters are consistent with Grade II diastolic dysfunction (pseudonormalization). Elevated left atrial  pressure. Right Ventricle: The right ventricular size is severely enlarged. No increase in right ventricular wall thickness. Right ventricular systolic function is mildly reduced. There is normal pulmonary artery systolic pressure. The tricuspid regurgitant velocity is 2.44 m/s, and with an assumed right atrial pressure of 8 mmHg, the estimated right ventricular systolic pressure is 81.1 mmHg. Left Atrium: Left atrial size was moderately dilated. Right Atrium: Right atrial size was mildly dilated. Pericardium: Trivial pericardial effusion is present. Mitral Valve: There is diastolic MR due to long AV  delay/first degree AV block. The mitral valve is normal in structure. Severe mitral valve regurgitation, with centrally-directed jet. Tricuspid Valve: The tricuspid valve is normal in structure. Tricuspid valve regurgitation is trivial. Aortic Valve: The aortic valve is tricuspid. Aortic valve regurgitation is mild to moderate. Aortic regurgitation PHT measures 359 msec. Mild to moderate aortic stenosis is present. Aortic valve mean gradient measures 11.0 mmHg. Aortic valve peak gradient measures 21.3 mmHg. Aortic valve area, by VTI measures 1.26 cm. Pulmonic Valve: The pulmonic valve was grossly normal. Pulmonic valve regurgitation is not visualized. Aorta: The aortic root is normal in size and structure. There is mild dilatation of the ascending aorta, measuring 42 mm. Venous: A pattern of systolic flow reversal, suggestive of severe mitral regurgitation is recorded from the right upper pulmonary vein. The inferior vena cava is dilated in size with greater than 50% respiratory variability, suggesting right atrial pressure of 8 mmHg. IAS/Shunts: No atrial level shunt detected by color flow Doppler.  LEFT VENTRICLE PLAX 2D LVIDd:         7.07 cm  Diastology LVIDs:         6.20 cm  LV e' medial:    4.03 cm/s LV PW:         1.60 cm  LV E/e' medial:  35.0 LV IVS:        1.00 cm  LV e' lateral:   6.42 cm/s LVOT diam:     1.90 cm  LV E/e' lateral: 22.0 LV SV:         47 LV SV  Index:   23 LVOT Area:     2.84 cm  RIGHT VENTRICLE             IVC RV Basal diam:  3.80 cm     IVC diam: 2.10 cm RV Mid diam:    2.80 cm RV S prime:     11.60 cm/s TAPSE (M-mode): 2.0 cm LEFT ATRIUM             Index       RIGHT ATRIUM           Index LA diam:        4.30 cm 2.08 cm/m  RA Area:     18.40 cm LA Vol (A2C):   79.3 ml 38.37 ml/m RA Volume:   51.50 ml  24.92 ml/m LA Vol (A4C):   68.8 ml 33.29 ml/m LA Biplane Vol: 74.4 ml 36.00 ml/m  AORTIC VALVE AV Area (Vmax):    1.29 cm AV Area (Vmean):   1.25 cm AV Area (VTI):     1.26 cm  AV Vmax:           231.00 cm/s AV Vmean:          157.500 cm/s AV VTI:            0.375 m AV Peak Grad:      21.3 mmHg AV Mean Grad:      11.0 mmHg LVOT Vmax:         105.00 cm/s LVOT Vmean:        69.200 cm/s LVOT VTI:          0.167 m LVOT/AV VTI ratio: 0.45 AI PHT:            359 msec  AORTA Ao Root diam: 3.00 cm Ao Asc diam:  4.20 cm MITRAL VALVE                 TRICUSPID VALVE MV Area (PHT): 3.60 cm      TR Peak grad:   23.8 mmHg MV Decel Time: 211 msec      TR Vmax:        244.00 cm/s MR Peak grad:    95.8 mmHg MR Mean grad:    59.5 mmHg   SHUNTS MR Vmax:         489.50 cm/s Systemic VTI:  0.17 m MR Vmean:        364.0 cm/s  Systemic Diam: 1.90 cm MR PISA:         1.01 cm MR PISA Eff ROA: 8 mm MR PISA Radius:  0.40 cm MV E velocity: 141.00 cm/s MV A velocity: 110.00 cm/s MV E/A ratio:  1.28 Mihai Croitoru MD Electronically signed by [name] MD Signature Date/Time: 06/21/2021/3:17:41 PM    Final     Cardiac Studies  TTE 06/21/2021  1. Left ventricular ejection fraction, by estimation, is 20 to 25%. The  left ventricle has severely decreased function. The left ventricle  demonstrates global hypokinesis. The left ventricular internal cavity size  was severely dilated. Left ventricular  diastolic parameters are consistent with Grade II diastolic dysfunction  (pseudonormalization). Elevated left atrial pressure.   2. Right ventricular systolic function is mildly reduced. The right  ventricular size is severely enlarged. There is normal pulmonary artery  systolic pressure. The estimated right ventricular systolic pressure is  63.8 mmHg.   3. Left atrial size was moderately dilated.   4. Right atrial size was mildly dilated.   5. The mitral  valve is normal in structure. Severe mitral valve  regurgitation.   6. The aortic valve is tricuspid. Aortic valve regurgitation is mild to  moderate. Mild to moderate aortic valve stenosis.   7. There is mild dilatation of the ascending aorta,  measuring 42 mm.   8. The inferior vena cava is dilated in size with >50% respiratory  variability, suggesting right atrial pressure of 8 mmHg.   Patient Profile  [facility] is a 80 y.o. male with ESRD on hemodialysis, chronic systolic heart failure (EF 20-25%), nonischemic cardiomyopathy, nonobstructive CAD, HIV, left bundle branch block, prior PEA arrest who was admitted on 06/07/2021 with generalized weakness and nausea.  Found to be back in atrial fibrillation.  Cardiology was consulted.  Assessment & Plan   Weakness/fatigue/encephalopathy -Clinically he is euvolemic.  He is in A. fib but I suspect something systemically is driving this.  Echocardiogram really unchanged.  MRI slightly worse however this was before dialysis. -White count is elevated.  Initial blood cultures are negative.  Repeats pending. -TSH is normal.  He is hemodynamically stable.  He is not in shock.  I believe this is noncardiac. -Ammonia normal -further work-up per hospital medicine   2.  Chronic systolic heart failure, 13-08%/MVHQ bundle branch block/moderate to severe MR -Nonischemic etiology.  Left heart cath in 2019 with nonobstructive disease. -Has a chronic left bundle branch block.  QRS 142 ms. -No options for ICD or CRT given ESRD and high risk for infection. -He appears clinically euvolemic.  I do not believe his current condition is explained by his cardiomyopathy. -on midodrine. Cannot tolerate GDMT due to hypotension.   3. Persistent Afib -back in Afib -suspect driven by current systemic illness -continue amiodarone and heparin drip.  -transition to po once neurological status improves  For questions or updates, please contact [place] Please consult [email or web address] for contact info under   Time Spent with Patient: I have spent a total of 35 minutes with patient reviewing hospital notes, telemetry, EKGs, labs and examining the patient as well as establishing an assessment and plan  that was discussed with the patient.  > 50% of time was spent in direct patient care.    Signed, Addison Naegeli. Audie Box, MD, [place]  06/22/2021 8:07 AM

## 2021-06-22 NOTE — Consult Note (Signed)
   [facility] Inpatient Consult   06/22/2021  David [name] Jun 03, 1941 [id number]  [place] [facility] [ACO] Patient:  Primary Care Provider:  [name], NP, [place], Embedded  Patient screened for hospitalization with noted high risk score for unplanned readmission risk and  to assess for potential [place] Management service needs for post hospital transition.    Plan:  Patient has an Embedded [facility] Provider which he may qualify for their Chronic Care Management program. Continue to follow progress and disposition to assess for post hospital care management needs.    For questions contact:   Natividad Brood, RN BSN [facility] Liaison  [phone] business mobile phone Toll free office [phone]  Fax number: [phone] [email or web address]

## 2021-06-22 NOTE — Progress Notes (Signed)
ANTICOAGULATION CONSULT NOTE - Follow-Up Consult  Pharmacy Consult for IV Heparin Indication: atrial fibrillation  Allergies  Allergen Reactions   Dextromethorphan-Guaifenesin Other (See Comments)    Unknown reaction   Tocotrienols Other (See Comments)    Unknown reaction   Losartan Potassium Other (See Comments)    Causes constipation    Patient Measurements: Height: 6\' 2"  (188 cm) Weight: 76.8 kg (169 lb 6.4 oz) IBW/kg (Calculated) : 82.2 Heparin Dosing Weight: 80.6 kg  Vital Signs: Temp: 98.9 F (37.2 C) (07/20 1112) Temp Source: Oral (07/20 1112) BP: 115/103 (07/20 1112) Pulse Rate: 99 (07/20 1112)  Labs: Recent Labs    06/29/2021 1700 06/17/2021 2105 06/21/21 0814 06/21/21 1136 06/21/21 1742 06/21/21 1843 06/22/21 0235 06/22/21 0617 06/22/21 1640  HGB 10.7*   < > 9.8* 10.5*  --  12.1* 12.2*  --   --   HCT 32.9*   < > 29.6* 31.0*  --  37.8* 36.5*  --   --   PLT 281   < > 260  --   --  214 272  --   --   APTT 34  --   --   --   --   --  41* 38* 46*  LABPROT 21.3*  --   --   --   --   --   --   --   --   INR 1.8*  --   --   --   --   --   --   --   --   HEPARINUNFRC  --   --   --   --   --   --   --  >1.10*  --   CREATININE 9.26*   < > 10.68*  --  7.23*  --  7.51*  --   --   TROPONINIHS 106*  --   --   --  127* 128*  --   --   --    < > = values in this interval not displayed.     Estimated Creatinine Clearance: 8.7 mL/min (A) (by C-G formula based on SCr of 7.51 mg/dL (H)).   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   Acute on chronic systolic and diastolic heart failure, NYHA class 4 (HCC)    Anemia, iron deficiency 11/15/2011   Arthritis    "hands, right knee, feet" (02/21/2018)   Atrial fibrillation (HCC)    Cardiac arrest ([place]) 02/17/2018   CHF (congestive heart failure) (HCC)    Chronic combined systolic and diastolic heart failure, NYHA class 3 ([place]) 06/2010   felt to be secondary to hypertensive  cardiomyopathy   Chronic lower back pain    CKD (chronic kidney disease) stage V requiring chronic dialysis ([place])    COVID-19 12/2019   ESRD on dialysis [facility])    started 02/2018 [place]   GERD (gastroesophageal reflux disease)    Glaucoma    "I had surgery and dont have it any more"   Gout    daily RX (02/21/2018)   Heart murmur    "mild" per pt   Hepatitis    years ago   HIV (human immunodeficiency virus infection) ([place])    HIV infection ([place])    Hyperlipidemia    Hypertension    Nonischemic cardiomyopathy ([place])    Pneumonia 11/2017   Prostate cancer ([place])    hx of prostate; s/p radioactive seed implant 10/2009 Dr Janice Norrie  Sinus bradycardia    Sleep apnea    does not use a cpap   Stroke [facility])    "mini stroke" years ago   Wears glasses     Assessment: 80 year old man with ESRD (TTS HD) admitted on 06/11/2021. Pt has hx of atrial fibrillation, for which he was on apixaban 5 mg PO BID PTA (last dose at 0800 on 7/18, per med rec; pt has not rec'd any doses of apixaban since admission, unable to sit up and swallow pills). Pharmacy was consulted to dose IV heparin for atrial fibrillation. Given recent apixaban exposure, will monitor anticoagulation using aPTT until aPTT and heparin levels correlate.  aPTT ~7.5 hrs after heparin infusion was increased to 1400 units/hr was 46 sec, which is below the goal range for this pt. H/H 12.2/36.5, plt 272 (CBC stable). Per RN, no issues with IV or bleeding observed.  Goal of Therapy:  Heparin level 0.3-0.7 units/ml aPTT 66-102 sec Monitor platelets by anticoagulation protocol: Yes   Plan:  Increase heparin infusion to 1650 units/hr (no bolus) Check aPTT, heparin level in 8 hrs Monitor daily aPTT, heparin level, CBC Monitor for bleeding F/U transition to oral anticoagulant when able  Gillermina Hu, PharmD, BCPS, [facility] Clinical Pharmacist 06/22/2021,6:36 PM

## 2021-06-23 DIAGNOSIS — R531 Weakness: Secondary | ICD-10-CM | POA: Diagnosis not present

## 2021-06-23 DIAGNOSIS — I4819 Other persistent atrial fibrillation: Secondary | ICD-10-CM | POA: Diagnosis not present

## 2021-06-23 DIAGNOSIS — I5023 Acute on chronic systolic (congestive) heart failure: Secondary | ICD-10-CM | POA: Diagnosis not present

## 2021-06-23 DIAGNOSIS — I4891 Unspecified atrial fibrillation: Secondary | ICD-10-CM | POA: Diagnosis not present

## 2021-06-23 DIAGNOSIS — N186 End stage renal disease: Secondary | ICD-10-CM | POA: Diagnosis not present

## 2021-06-23 LAB — RENAL FUNCTION PANEL
Albumin: 2.7 g/dL — ABNORMAL LOW (ref 3.5–5.0)
Anion gap: 23 — ABNORMAL HIGH (ref 5–15)
BUN: 74 mg/dL — ABNORMAL HIGH (ref 8–23)
CO2: 22 mmol/L (ref 22–32)
Calcium: 9.5 mg/dL (ref 8.9–10.3)
Chloride: 94 mmol/L — ABNORMAL LOW (ref 98–111)
Creatinine, Ser: 9.33 mg/dL — ABNORMAL HIGH (ref 0.61–1.24)
GFR, Estimated: 5 mL/min — ABNORMAL LOW (ref >60)
Glucose, Bld: 120 mg/dL — ABNORMAL HIGH (ref 70–99)
Phosphorus: 10.4 mg/dL — ABNORMAL HIGH (ref 2.5–4.6)
Potassium: 5.2 mmol/L — ABNORMAL HIGH (ref 3.5–5.1)
Sodium: 139 mmol/L (ref 135–145)

## 2021-06-23 LAB — APTT
aPTT: 54 seconds — ABNORMAL HIGH (ref 24–36)
aPTT: 57 seconds — ABNORMAL HIGH (ref 24–36)
aPTT: 80 seconds — ABNORMAL HIGH (ref 24–36)

## 2021-06-23 LAB — CBC
HCT: 32.1 % — ABNORMAL LOW (ref 39.0–52.0)
Hemoglobin: 11.1 g/dL — ABNORMAL LOW (ref 13.0–17.0)
MCH: 35.6 pg — ABNORMAL HIGH (ref 26.0–34.0)
MCHC: 34.6 g/dL (ref 30.0–36.0)
MCV: 102.9 fL — ABNORMAL HIGH (ref 80.0–100.0)
Platelets: 276 10*3/uL (ref 150–400)
RBC: 3.12 MIL/uL — ABNORMAL LOW (ref 4.22–5.81)
RDW: 15 % (ref 11.5–15.5)
WBC: 13.1 10*3/uL — ABNORMAL HIGH (ref 4.0–10.5)
nRBC: 0 % (ref 0.0–0.2)

## 2021-06-23 LAB — HEPARIN LEVEL (UNFRACTIONATED): Heparin Unfractionated: >1.1 IU/mL — ABNORMAL HIGH (ref 0.30–0.70)

## 2021-06-23 LAB — PROCALCITONIN: Procalcitonin: 29.91 ng/mL

## 2021-06-23 MED ORDER — AMIODARONE HCL 200 MG PO TABS
200.0000 mg | ORAL_TABLET | Freq: Every day | ORAL | Status: DC
  Administered 2021-06-23 – 2021-06-26 (×4): 200 mg via ORAL
  Filled 2021-06-23 (×4): qty 1

## 2021-06-23 MED ORDER — ALBUMIN HUMAN 25 % IV SOLN: 12.5000 g | Freq: Once | INTRAVENOUS | Status: AC

## 2021-06-23 MED ORDER — ONDANSETRON HCL 4 MG/2ML IJ SOLN
INTRAMUSCULAR | Status: AC
  Filled 2021-06-23: qty 2

## 2021-06-23 MED ORDER — ALBUMIN HUMAN 25 % IV SOLN
INTRAVENOUS | Status: AC
  Administered 2021-06-23: 12.5 g via INTRAVENOUS
  Filled 2021-06-23: qty 100

## 2021-06-23 NOTE — Progress Notes (Signed)
Cardiology Progress Note  Patient ID: David [name] MRN: [id number] DOB: 06-24-1941 Date of Encounter: 06/23/2021  Primary Cardiologist: [name], MD  Subjective   Chief Complaint: None.  HPI: Alert, awake, oriented to person place and situation.  EEG shows encephalopathy however his mentation is much improved today.  Remains in A. fib that is controlled with amiodarone.  Volume status acceptable.  ROS:  All other ROS reviewed and negative. Pertinent positives noted in the HPI.     Inpatient Medications  Scheduled Meds:  (feeding supplement) PROSource Plus  30 mL Oral TID BM   allopurinol  100 mg Oral Once per day on Mon Tue Thu Sat   bictegravir-emtricitabine-tenofovir AF  1 tablet Oral Daily   brimonidine  1 drop Right Eye BID   Chlorhexidine Gluconate Cloth  6 each Topical Q0600   midodrine  10 mg Oral 2 times per day on Tue Thu Sat   midodrine  5 mg Oral Once per day on Sun Mon Wed Fri   montelukast  10 mg Oral QHS   multivitamin  1 tablet Oral QPM   ofloxacin  1 drop Right Eye QID   pantoprazole (PROTONIX) IV  40 mg Intravenous Daily   sodium zirconium cyclosilicate  10 g Oral Daily   sucroferric oxyhydroxide  500 mg Oral TID with meals   Continuous Infusions:  amiodarone 30 mg/hr (06/23/21 0421)   cefTRIAXone (ROCEPHIN)  IV 2 g (06/22/21 2213)   heparin 1,800 Units/hr (06/23/21 1009)   PRN Meds: acetaminophen, acetaminophen, oxyCODONE, polyethylene glycol, prochlorperazine   Vital Signs   Vitals:   06/22/21 1112 06/22/21 1952 06/22/21 2353 06/23/21 0337  BP: (!) 115/103 128/70 112/83 (!) 117/59  Pulse: 99 91 96 86  Resp: 18 19 19 17   Temp: 98.9 F (37.2 C) 97.9 F (36.6 C) 98.7 F (37.1 C) 97.8 F (36.6 C)  TempSrc: Oral Oral Oral Oral  SpO2: 100% 98% 100% 100%  Weight:    78.1 kg  Height:        Intake/Output Summary (Last 24 hours) at 06/23/2021 1051 Last data filed at 06/23/2021 0809 Gross per 24 hour  Intake 1525.84 ml  Output 20 ml   Net 1505.84 ml   Last 3 Weights 06/23/2021 06/22/2021 06/22/2021  Weight (lbs) 172 lb 2.9 oz 169 lb 6.4 oz 169 lb 6.4 oz  Weight (kg) 78.1 kg 76.839 kg 76.839 kg      Telemetry  Overnight telemetry shows atrial fibrillation heart rate in the 90-110 beats minute range, which I personally reviewed.   Physical Exam   Vitals:   06/22/21 1112 06/22/21 1952 06/22/21 2353 06/23/21 0337  BP: (!) 115/103 128/70 112/83 (!) 117/59  Pulse: 99 91 96 86  Resp: 18 19 19 17   Temp: 98.9 F (37.2 C) 97.9 F (36.6 C) 98.7 F (37.1 C) 97.8 F (36.6 C)  TempSrc: Oral Oral Oral Oral  SpO2: 100% 98% 100% 100%  Weight:    78.1 kg  Height:        Intake/Output Summary (Last 24 hours) at 06/23/2021 1051 Last data filed at 06/23/2021 0809 Gross per 24 hour  Intake 1525.84 ml  Output 20 ml  Net 1505.84 ml    Last 3 Weights 06/23/2021 06/22/2021 06/22/2021  Weight (lbs) 172 lb 2.9 oz 169 lb 6.4 oz 169 lb 6.4 oz  Weight (kg) 78.1 kg 76.839 kg 76.839 kg    Body mass index is 22.11 kg/m.   General: Well nourished, well developed, in no  acute distress Head: Atraumatic, normal size  Eyes: PEERLA, EOMI  Neck: Supple, no JVD Endocrine: No thryomegaly Cardiac: Normal S1, S2; irregular rhythm, no murmurs rubs or gallops Lungs: Clear to auscultation bilaterally, no wheezing, rhonchi or rales  Abd: Soft, nontender, no hepatomegaly  Ext: No edema, pulses 2+ Musculoskeletal: No deformities, BUE and BLE strength normal and equal Skin: Warm and dry, no rashes   Neuro: Alert, awake, oriented to person place and situation  Labs  High Sensitivity Troponin:   Recent Labs  Lab 06/06/2021 1700 06/21/21 1742 06/21/21 1843  TROPONINIHS 106* 127* 128*     Cardiac EnzymesNo results for input(s): TROPONINI in the last 168 hours. No results for input(s): TROPIPOC in the last 168 hours.  Chemistry Recent Labs  Lab 06/21/21 0814 06/21/21 1136 06/21/21 1742 06/22/21 0235 06/23/21 0349  NA 137   < > 138 138 139   K 5.7*   < > 5.1 5.3* 5.2*  CL 94*  --  94* 93* 94*  CO2 28  --  26 27 22   GLUCOSE 99  --  94 126* 120*  BUN 75*  --  44* 44* 74*  CREATININE 10.68*  --  7.23* 7.51* 9.33*  CALCIUM 8.9  --  9.5 9.6 9.5  PROT 6.4*  --  8.2* 7.6  --   ALBUMIN 2.9*  --  3.5 3.1* 2.7*  AST 24  --  42* 51*  --   ALT 6  --  12 13  --   ALKPHOS 105  --  126 122  --   BILITOT 1.1  --  1.7* 1.0  --   GFRNONAA 4*  --  7* 7* 5*  ANIONGAP 15  --  18* 18* 23*   < > = values in this interval not displayed.    Hematology Recent Labs  Lab 06/21/21 1843 06/22/21 0235 06/23/21 0349  WBC 17.4* 16.6* 13.1*  RBC 3.42* 3.46* 3.12*  HGB 12.1* 12.2* 11.1*  HCT 37.8* 36.5* 32.1*  MCV 110.5* 105.5* 102.9*  MCH 35.4* 35.3* 35.6*  MCHC 32.0 33.4 34.6  RDW 15.3 15.1 15.0  PLT 214 272 276   BNP Recent Labs  Lab 06/08/2021 1700  BNP 4,128.9*    DDimer No results for input(s): DDIMER in the last 168 hours.   Radiology  DG CHEST PORT 1 VIEW  Result Date: 06/22/2021 CLINICAL DATA:  Shortness of breath. EXAM: PORTABLE CHEST 1 VIEW COMPARISON:  06/21/2021. FINDINGS: Stable severe cardiomegaly. No pulmonary venous congestion. No focal infiltrate. No pleural effusion or pneumothorax. Degenerative change thoracic spine. IMPRESSION: 1.  Stable cardiomegaly.  No pulmonary venous congestion. 2.  No acute pulmonary disease. Electronically Signed   By: Marcello Moores  Register   On: 06/22/2021 06:28   DG CHEST PORT 1 VIEW  Result Date: 06/21/2021 CLINICAL DATA:  Fever. EXAM: PORTABLE CHEST 1 VIEW COMPARISON:  Radiograph yesterday. FINDINGS: Stable enlarged cardiac silhouette. Unchanged mediastinal contours. Slight increasing vascular congestion. No confluent consolidation. Minor right lung base atelectasis. No pneumothorax or pleural effusion. No acute osseous abnormalities. IMPRESSION: 1. Stable cardiomegaly. Slight increasing vascular congestion. 2. Minor right lung base atelectasis. Electronically Signed   By: Keith Rake M.D.    On: 06/21/2021 22:48   EEG adult  Result Date: 06/22/2021 Lora Havens, MD     06/22/2021  3:34 PM Patient Name: David [name] MRN: [id number] Epilepsy Attending: Lora Havens Referring Physician/Provider: Dr Carlisle Cater Date: 06/22/2021 Duration: 24.32 mins Patient history: 80 year old male  with altered mental status.  EEG to evaluate for seizures. Level of alertness: Awake, asleep AEDs during EEG study: None Technical aspects: This EEG study was done with scalp electrodes positioned according to the 10-20 International system of electrode placement. Electrical activity was acquired at a sampling rate of 500Hz  and reviewed with a high frequency filter of 70Hz  and a low frequency filter of 1Hz . EEG data were recorded continuously and digitally stored. Description: No clear posterior dominant rhythm was seen. Sleep was characterized by vertex waves, sleep spindles (12 to 14 Hz), maximal frontocentral region.  EEG also showed continuous 6-8 Hz theta-alpha activity as well as intermittent generalized 2 to 3 Hz delta slowing.  Hyperventilation and photic stimulation were not performed.   ABNORMALITY - Continuous slow, generalized IMPRESSION: This study is suggestive of moderate diffuse encephalopathy, nonspecific etiology. No seizures or epileptiform discharges were seen throughout the recording. Lora Havens   ECHOCARDIOGRAM COMPLETE  Result Date: 06/21/2021    ECHOCARDIOGRAM REPORT   Patient Name:   CHIN WACHTER [facility] Date of Exam: 06/21/2021 Medical Rec #:  [id number]            Height:       74.0 in Accession #:    [id number]           Weight:       177.7 lb Date of Birth:  November 08, 1941            BSA:          2.067 m Patient Age:    12 years             BP:           126/66 mmHg Patient Gender: M                    HR:           89 bpm. Exam Location:  Inpatient Procedure: 2D Echo, Cardiac Doppler and Color Doppler Indications:    I50.23 Acute on chronic systolic (congestive) heart  failure  History:        Patient has prior history of Echocardiogram examinations, most                 recent 12/01/2020. Cardiomyopathy, Stroke, Arrythmias:Atrial                 Fibrillation, Signs/Symptoms:Murmur; Risk Factors:Hypertension,                 Dyslipidemia and Sleep Apnea. ESRD. COVID-19. GERD.  Sonographer:    Jonelle Sidle Dance Referring Phys: [id number] Boyce  1. Left ventricular ejection fraction, by estimation, is 20 to 25%. The left ventricle has severely decreased function. The left ventricle demonstrates global hypokinesis. The left ventricular internal cavity size was severely dilated. Left ventricular diastolic parameters are consistent with Grade II diastolic dysfunction (pseudonormalization). Elevated left atrial pressure.  2. Right ventricular systolic function is mildly reduced. The right ventricular size is severely enlarged. There is normal pulmonary artery systolic pressure. The estimated right ventricular systolic pressure is 60.7 mmHg.  3. Left atrial size was moderately dilated.  4. Right atrial size was mildly dilated.  5. The mitral valve is normal in structure. Severe mitral valve regurgitation.  6. The aortic valve is tricuspid. Aortic valve regurgitation is mild to moderate. Mild to moderate aortic valve stenosis.  7. There is mild dilatation of the ascending aorta, measuring 42 mm.  8. The inferior vena cava is dilated in  size with >50% respiratory variability, suggesting right atrial pressure of 8 mmHg. Comparison(s): Prior images reviewed side by side. The left ventricular function is unchanged. The right ventricular systolic function is worse. Mitral regurgitation appears more severe. There is systolic flow reversal in the pulmonary veins. In a hemodialysis patient, this may be due to different loading conditions. FINDINGS  Left Ventricle: Left ventricular ejection fraction, by estimation, is 20 to 25%. The left ventricle has severely decreased function. The  left ventricle demonstrates global hypokinesis. The left ventricular internal cavity size was severely dilated. There is no left ventricular hypertrophy. Abnormal (paradoxical) septal motion, consistent with left bundle branch block. Left ventricular diastolic parameters are consistent with Grade II diastolic dysfunction (pseudonormalization). Elevated left atrial  pressure. Right Ventricle: The right ventricular size is severely enlarged. No increase in right ventricular wall thickness. Right ventricular systolic function is mildly reduced. There is normal pulmonary artery systolic pressure. The tricuspid regurgitant velocity is 2.44 m/s, and with an assumed right atrial pressure of 8 mmHg, the estimated right ventricular systolic pressure is 75.6 mmHg. Left Atrium: Left atrial size was moderately dilated. Right Atrium: Right atrial size was mildly dilated. Pericardium: Trivial pericardial effusion is present. 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 The mitral valve is normal in structure. Severe mitral valve regurgitation, with centrally-directed jet. Tricuspid Valve: The tricuspid valve is normal in structure. Tricuspid valve regurgitation is trivial. Aortic Valve: The aortic valve is tricuspid. Aortic valve regurgitation is mild to moderate. Aortic regurgitation PHT measures 359 msec. Mild to moderate aortic stenosis is present. Aortic valve mean gradient measures 11.0 mmHg. Aortic valve peak gradient measures 21.3 mmHg. Aortic valve area, by VTI measures 1.26 cm. Pulmonic Valve: The pulmonic valve was grossly normal. Pulmonic valve regurgitation is not visualized. Aorta: The aortic root is normal in size and structure. There is mild dilatation of the ascending aorta, measuring 42 mm. Venous: A pattern of systolic flow reversal, suggestive of severe mitral regurgitation is recorded from the right upper pulmonary vein. The inferior vena cava is dilated in size with greater  than 50% respiratory variability, suggesting right atrial pressure of 8 mmHg. IAS/Shunts: No atrial level shunt detected by color flow Doppler.  LEFT VENTRICLE PLAX 2D LVIDd:         7.07 cm  Diastology LVIDs:         6.20 cm  LV e' medial:    4.03 cm/s LV PW:         1.60 cm  LV E/e' medial:  35.0 LV IVS:        1.00 cm  LV e' lateral:   6.42 cm/s LVOT diam:     1.90 cm  LV E/e' lateral: 22.0 LV SV:         47 LV SV Index:   23 LVOT Area:     2.84 cm  RIGHT VENTRICLE             IVC RV Basal diam:  3.80 cm     IVC diam: 2.10 cm RV Mid diam:    2.80 cm RV S prime:     11.60 cm/s TAPSE (M-mode): 2.0 cm LEFT ATRIUM             Index       RIGHT ATRIUM           Index LA diam:        4.30 cm 2.08 cm/m  RA Area:  18.40 cm LA Vol (A2C):   79.3 ml 38.37 ml/m RA Volume:   51.50 ml  24.92 ml/m LA Vol (A4C):   68.8 ml 33.29 ml/m LA Biplane Vol: 74.4 ml 36.00 ml/m  AORTIC VALVE AV Area (Vmax):    1.29 cm AV Area (Vmean):   1.25 cm AV Area (VTI):     1.26 cm AV Vmax:           231.00 cm/s AV Vmean:          157.500 cm/s AV VTI:            0.375 m AV Peak Grad:      21.3 mmHg AV Mean Grad:      11.0 mmHg LVOT Vmax:         105.00 cm/s LVOT Vmean:        69.200 cm/s LVOT VTI:          0.167 m LVOT/AV VTI ratio: 0.45 AI PHT:            359 msec  AORTA Ao Root diam: 3.00 cm Ao Asc diam:  4.20 cm MITRAL VALVE                 TRICUSPID VALVE MV Area (PHT): 3.60 cm      TR Peak grad:   23.8 mmHg MV Decel Time: 211 msec      TR Vmax:        244.00 cm/s MR Peak grad:    95.8 mmHg MR Mean grad:    59.5 mmHg   SHUNTS MR Vmax:         489.50 cm/s Systemic VTI:  0.17 m MR Vmean:        364.0 cm/s  Systemic Diam: 1.90 cm MR PISA:         1.01 cm MR PISA Eff ROA: 8 mm MR PISA Radius:  0.40 cm MV E velocity: 141.00 cm/s MV A velocity: 110.00 cm/s MV E/A ratio:  1.28 Mihai Croitoru MD Electronically signed by [name] MD Signature Date/Time: 06/21/2021/3:17:41 PM    Final     Cardiac Studies  TTE 06/21/2021  1. Left  ventricular ejection fraction, by estimation, is 20 to 25%. The  left ventricle has severely decreased function. The left ventricle  demonstrates global hypokinesis. The left ventricular internal cavity size  was severely dilated. Left ventricular  diastolic parameters are consistent with Grade II diastolic dysfunction  (pseudonormalization). Elevated left atrial pressure.   2. Right ventricular systolic function is mildly reduced. The right  ventricular size is severely enlarged. There is normal pulmonary artery  systolic pressure. The estimated right ventricular systolic pressure is  70.6 mmHg.   3. Left atrial size was moderately dilated.   4. Right atrial size was mildly dilated.   5. The mitral valve is normal in structure. Severe mitral valve  regurgitation.   6. The aortic valve is tricuspid. Aortic valve regurgitation is mild to  moderate. Mild to moderate aortic valve stenosis.   7. There is mild dilatation of the ascending aorta, measuring 42 mm.   8. The inferior vena cava is dilated in size with >50% respiratory  variability, suggesting right atrial pressure of 8 mmHg.   Patient Profile  [facility] Petraglia is a 80 y.o. 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  Found to  be back in atrial fibrillation.  Cardiology was consulted.  Assessment & Plan   Weakness/fatigue/encephalopathy -Not related to his cardiomyopathy.  A. fib not contributing either.  Urine studies show possible UTI. -TSH is normal.  White count was elevated.  Initial blood cultures and repeats are negative.  No evidence of cardiogenic shock.  Ammonia normal.  Possibly this is related to urinary source.  EEG with diffuse encephalopathy. -His mentation is improved today.  He is alert awake and oriented to situation.   Much better than yesterday.  2.  Chronic systolic heart failure, EF 20-25%, left bundle branch block/moderate-severe mitral regurgitation -Nonischemic cardiomyopathy.  Left heart cath with nonobstructive disease in 2019. -Chronic left bundle branch block.  QRS 142 ms. 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  Likely related to peripheral vascular disease as well as being on dialysis.  Intolerant of all guideline directed medical therapy. -He appears euvolemic and volume status is managed by hemodialysis.  3.  Persistent atrial fibrillation -Intolerant of beta-blockers.  Digoxin is not an option.  On amiodarone drip.  We will transition to p.o. amnio 200 mg daily. -On heparin drip.  Would transition back to Alfred once we know procedures will not be planned.  For questions or updates, please contact Wilder Please consult [email or web address] for contact info under   Time Spent with Patient: I have spent a total of 35 minutes with patient reviewing hospital notes, telemetry, EKGs, labs and examining the patient as well as establishing an assessment and plan that was discussed with the patient.  > 50% of time was spent in direct patient care.    Signed, Addison Naegeli. Audie Box, MD, [place]  06/23/2021 10:51 AM

## 2021-06-23 NOTE — Progress Notes (Signed)
ANTICOAGULATION CONSULT NOTE  Pharmacy Consult for Heparin Indication: atrial fibrillation  Allergies  Allergen Reactions   Dextromethorphan-Guaifenesin Other (See Comments)    Unknown reaction   Tocotrienols Other (See Comments)    Unknown reaction   Losartan Potassium Other (See Comments)    Causes constipation    Patient Measurements: Height: 6\' 2"  (188 cm) Weight: 81.4 kg (179 lb 7.3 oz) IBW/kg (Calculated) : 82.2 Heparin Dosing Weight: TBW  Vital Signs: Temp: 98 F (36.7 C) (07/21 2151) Temp Source: Oral (07/21 2151) BP: 103/72 (07/21 2151) Pulse Rate: 97 (07/21 2151)  Labs: Recent Labs    06/21/21 1742 06/21/21 1843 06/21/21 1843 06/22/21 0235 06/22/21 0617 06/22/21 1640 06/23/21 0349 06/23/21 1302 06/23/21 2237  HGB  --  12.1*  --  12.2*  --   --  11.1*  --   --   HCT  --  37.8*  --  36.5*  --   --  32.1*  --   --   PLT  --  214  --  272  --   --  276  --   --   APTT  --   --    < > 41* 38*   < > 54* 57* 80*  HEPARINUNFRC  --   --   --   --  >1.10*  --  >1.10*  --   --   CREATININE 7.23*  --   --  7.51*  --   --  9.33*  --   --   TROPONINIHS 127* 128*  --   --   --   --   --   --   --    < > = values in this interval not displayed.     Estimated Creatinine Clearance: 7.4 mL/min (A) (by C-G formula based on SCr of 9.33 mg/dL (H)).   Assessment: 80 year old man with ESRD (TTS HD) admitted on 06/17/2021. Pt has hx of atrial fibrillation, for which he was on apixaban 5 mg PO BID PTA (last dose at 0800 on 7/18, per med rec; pt has not rec'd any doses of apixaban since admission, unable to sit up and swallow pills).   Pharmacy was consulted to dose IV heparin for atrial fibrillation. Given recent apixaban exposure, will monitor anticoagulation using aPTT until aPTT and heparin levels correlate.  aPTT 80 sec (therapeutic) on heparin infusion at 2000 units/hr. No bleeding noted.   Goal of Therapy:  Heparin level 0.3-0.7 units/ml aPTT 66-102 sec Monitor  platelets by anticoagulation protocol: Yes   Plan:  Continue heparin infusion to 2000 units/hr. Will f/u 6 daily aPTT to confirm therapeutic  Sherlon Handing, PharmD, BCPS Please see amion for complete clinical pharmacist phone list 06/23/2021,11:19 PM

## 2021-06-23 NOTE — Progress Notes (Signed)
Patient ID: David [name], male   DOB: 02-24-1941, 80 y.o.   MRN: [id number] S: No complaints, more awake and alert today.  Oriented x 3 O:BP (!) 117/59 (BP Location: Left Arm)   Pulse 86   Temp 97.8 F (36.6 C) (Oral)   Resp 17   Ht 6\' 2"  (1.88 m)   Wt 78.1 kg   SpO2 100%   BMI 22.11 kg/m   Intake/Output Summary (Last 24 hours) at 06/23/2021 1036 Last data filed at 06/23/2021 0809 Gross per 24 hour  Intake 1525.84 ml  Output 20 ml  Net 1505.84 ml   Intake/Output: I/O last 3 completed shifts: In: 1636.4 [P.O.:240; I.V.:1210.7; IV Piggyback:185.6] Out: 20 [Urine:20]  Intake/Output this shift:  Total I/O In: 458 [P.O.:458] Out: -  Weight change: 1.261 kg Gen:NAD CVS: no rub Resp: cta Abd: +BS, soft, NT/ND Ext: no edema, RUE AVG +T/B  Recent Labs  Lab 06/27/2021 1700 06/10/2021 2105 06/21/21 0814 06/21/21 1136 06/21/21 1742 06/22/21 0235 06/23/21 0349  NA 137  --  137 137 138 138 139  K 5.4*  --  5.7* 5.4* 5.1 5.3* 5.2*  CL 93*  --  94*  --  94* 93* 94*  CO2 27  --  28  --  26 27 22   GLUCOSE 114*  --  99  --  94 126* 120*  BUN 56*  --  75*  --  44* 44* 74*  CREATININE 9.26* 9.68* 10.68*  --  7.23* 7.51* 9.33*  ALBUMIN 3.2*  --  2.9*  --  3.5 3.1* 2.7*  CALCIUM 8.9  --  8.9  --  9.5 9.6 9.5  PHOS 8.5*  --  10.3*  --  7.7* 8.2* 10.4*  AST 29  --  24  --  42* 51*  --   ALT 7  --  6  --  12 13  --    Liver Function Tests: Recent Labs  Lab 06/21/21 0814 06/21/21 1742 06/22/21 0235 06/23/21 0349  AST 24 42* 51*  --   ALT 6 12 13   --   ALKPHOS 105 126 122  --   BILITOT 1.1 1.7* 1.0  --   PROT 6.4* 8.2* 7.6  --   ALBUMIN 2.9* 3.5 3.1* 2.7*   No results for input(s): LIPASE, AMYLASE in the last 168 hours. Recent Labs  Lab 06/15/2021 1700  AMMONIA 31   CBC: Recent Labs  Lab 06/13/2021 2105 06/21/21 0814 06/21/21 1136 06/21/21 1843 06/22/21 0235 06/23/21 0349  WBC 9.7 10.3  --  17.4* 16.6* 13.1*  NEUTROABS  --  7.9*  --  15.2* 13.4*  --   HGB 10.8*  9.8*   < > 12.1* 12.2* 11.1*  HCT 33.4* 29.6*   < > 37.8* 36.5* 32.1*  MCV 110.2* 108.4*  --  110.5* 105.5* 102.9*  PLT 260 260  --  214 272 276   < > = values in this interval not displayed.   Cardiac Enzymes: No results for input(s): CKTOTAL, CKMB, CKMBINDEX, TROPONINI in the last 168 hours. CBG: Recent Labs  Lab 06/06/2021 1529 07/01/2021 2212 06/21/21 0831  GLUCAP 116* 111* 110*    Iron Studies: No results for input(s): IRON, TIBC, TRANSFERRIN, FERRITIN in the last 72 hours. Studies/Results: DG CHEST PORT 1 VIEW  Result Date: 06/22/2021 CLINICAL DATA:  Shortness of breath. EXAM: PORTABLE CHEST 1 VIEW COMPARISON:  06/21/2021. FINDINGS: Stable severe cardiomegaly. No pulmonary venous congestion. No focal infiltrate. No pleural effusion or pneumothorax.  Degenerative change thoracic spine. IMPRESSION: 1.  Stable cardiomegaly.  No pulmonary venous congestion. 2.  No acute pulmonary disease. Electronically Signed   By: Marcello Moores  Register   On: 06/22/2021 06:28   DG CHEST PORT 1 VIEW  Result Date: 06/21/2021 CLINICAL DATA:  Fever. EXAM: PORTABLE CHEST 1 VIEW COMPARISON:  Radiograph yesterday. FINDINGS: Stable enlarged cardiac silhouette. Unchanged mediastinal contours. Slight increasing vascular congestion. No confluent consolidation. Minor right lung base atelectasis. No pneumothorax or pleural effusion. No acute osseous abnormalities. IMPRESSION: 1. Stable cardiomegaly. Slight increasing vascular congestion. 2. Minor right lung base atelectasis. Electronically Signed   By: Keith Rake M.D.   On: 06/21/2021 22:48   EEG adult  Result Date: 06/22/2021 Lora Havens, MD     06/22/2021  3:34 PM Patient Name: David [name] MRN: [id number] Epilepsy Attending: Lora Havens Referring Physician/Provider: Dr Carlisle Cater Date: 06/22/2021 Duration: 24.32 mins Patient history: 80 year old male with altered mental status.  EEG to evaluate for seizures. Level of alertness: Awake, asleep  AEDs during EEG study: None Technical aspects: This EEG study was done with scalp electrodes positioned according to the 10-20 International system of electrode placement. Electrical activity was acquired at a sampling rate of 500Hz  and reviewed with a high frequency filter of 70Hz  and a low frequency filter of 1Hz . EEG data were recorded continuously and digitally stored. Description: No clear posterior dominant rhythm was seen. Sleep was characterized by vertex waves, sleep spindles (12 to 14 Hz), maximal frontocentral region.  EEG also showed continuous 6-8 Hz theta-alpha activity as well as intermittent generalized 2 to 3 Hz delta slowing.  Hyperventilation and photic stimulation were not performed.   ABNORMALITY - Continuous slow, generalized IMPRESSION: This study is suggestive of moderate diffuse encephalopathy, nonspecific etiology. No seizures or epileptiform discharges were seen throughout the recording. Lora Havens   ECHOCARDIOGRAM COMPLETE  Result Date: 06/21/2021    ECHOCARDIOGRAM REPORT   Patient Name:   David [name] [facility] Date of Exam: 06/21/2021 Medical Rec #:  [id number]            Height:       74.0 in Accession #:    [id number]           Weight:       177.7 lb Date of Birth:  05-24-1941            BSA:          2.067 m Patient Age:    51 years             BP:           126/66 mmHg Patient Gender: M                    HR:           89 bpm. Exam Location:  Inpatient Procedure: 2D Echo, Cardiac Doppler and Color Doppler Indications:    I50.23 Acute on chronic systolic (congestive) heart failure  History:        Patient has prior history of Echocardiogram examinations, most                 recent 12/01/2020. Cardiomyopathy, Stroke, Arrythmias:Atrial                 Fibrillation, Signs/Symptoms:Murmur; Risk Factors:Hypertension,                 Dyslipidemia and Sleep Apnea. ESRD. COVID-19. GERD.  Sonographer:    Jonelle Sidle  Dance Referring Phys: [id number] [place]  1. Left  ventricular ejection fraction, by estimation, is 20 to 25%. The left ventricle has severely decreased function. The left ventricle demonstrates global hypokinesis. The left ventricular internal cavity size was severely dilated. Left ventricular diastolic parameters are consistent with Grade II diastolic dysfunction (pseudonormalization). Elevated left atrial pressure.  2. Right ventricular systolic function is mildly reduced. The right ventricular size is severely enlarged. There is normal pulmonary artery systolic pressure. The estimated right ventricular systolic pressure is 63.7 mmHg.  3. Left atrial size was moderately dilated.  4. Right atrial size was mildly dilated.  5. The mitral valve is normal in structure. Severe mitral valve regurgitation.  6. The aortic valve is tricuspid. Aortic valve regurgitation is mild to moderate. Mild to moderate aortic valve stenosis.  7. There is mild dilatation of the ascending aorta, measuring 42 mm.  8. The inferior vena cava is dilated in size with >50% respiratory variability, suggesting right atrial pressure of 8 mmHg. Comparison(s): Prior images reviewed side by side. The left ventricular function is unchanged. The right ventricular systolic function is worse. Mitral regurgitation appears more severe. There is systolic flow reversal in the pulmonary veins. In a hemodialysis patient, this may be due to different loading conditions. FINDINGS  Left Ventricle: Left ventricular ejection fraction, by estimation, is 20 to 25%. The left ventricle has severely decreased function. The left ventricle demonstrates global hypokinesis. The left ventricular internal cavity size was severely dilated. There is no left ventricular hypertrophy. Abnormal (paradoxical) septal motion, consistent with left bundle branch block. Left ventricular diastolic parameters are consistent with Grade II diastolic dysfunction (pseudonormalization). Elevated left atrial  pressure. Right Ventricle: The  right ventricular size is severely enlarged. No increase in right ventricular wall thickness. Right ventricular systolic function is mildly reduced. There is normal pulmonary artery systolic pressure. The tricuspid regurgitant velocity is 2.44 m/s, and with an assumed right atrial pressure of 8 mmHg, the estimated right ventricular systolic pressure is 85.8 mmHg. Left Atrium: Left atrial size was moderately dilated. Right Atrium: Right atrial size was mildly dilated. Pericardium: Trivial pericardial effusion is present. 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 The mitral valve is normal in structure. Severe mitral valve regurgitation, with centrally-directed jet. Tricuspid Valve: The tricuspid valve is normal in structure. Tricuspid valve regurgitation is trivial. Aortic Valve: The aortic valve is tricuspid. Aortic valve regurgitation is mild to moderate. Aortic regurgitation PHT measures 359 msec. Mild to moderate aortic stenosis is present. Aortic valve mean gradient measures 11.0 mmHg. Aortic valve peak gradient measures 21.3 mmHg. Aortic valve area, by VTI measures 1.26 cm. Pulmonic Valve: The pulmonic valve was grossly normal. Pulmonic valve regurgitation is not visualized. Aorta: The aortic root is normal in size and structure. There is mild dilatation of the ascending aorta, measuring 42 mm. Venous: A pattern of systolic flow reversal, suggestive of severe mitral regurgitation is recorded from the right upper pulmonary vein. The inferior vena cava is dilated in size with greater than 50% respiratory variability, suggesting right atrial pressure of 8 mmHg. IAS/Shunts: No atrial level shunt detected by color flow Doppler.  LEFT VENTRICLE PLAX 2D LVIDd:         7.07 cm  Diastology LVIDs:         6.20 cm  LV e' medial:    4.03 cm/s LV PW:         1.60 cm  LV E/e' medial:  35.0 LV IVS:        1.00 cm  LV e' lateral:   6.42 cm/s LVOT diam:     1.90 cm  LV E/e' lateral: 22.0  LV SV:         47 LV SV Index:   23 LVOT Area:     2.84 cm  RIGHT VENTRICLE             IVC RV Basal diam:  3.80 cm     IVC diam: 2.10 cm RV Mid diam:    2.80 cm RV S prime:     11.60 cm/s TAPSE (M-mode): 2.0 cm LEFT ATRIUM             Index       RIGHT ATRIUM           Index LA diam:        4.30 cm 2.08 cm/m  RA Area:     18.40 cm LA Vol (A2C):   79.3 ml 38.37 ml/m RA Volume:   51.50 ml  24.92 ml/m LA Vol (A4C):   68.8 ml 33.29 ml/m LA Biplane Vol: 74.4 ml 36.00 ml/m  AORTIC VALVE AV Area (Vmax):    1.29 cm AV Area (Vmean):   1.25 cm AV Area (VTI):     1.26 cm AV Vmax:           231.00 cm/s AV Vmean:          157.500 cm/s AV VTI:            0.375 m AV Peak Grad:      21.3 mmHg AV Mean Grad:      11.0 mmHg LVOT Vmax:         105.00 cm/s LVOT Vmean:        69.200 cm/s LVOT VTI:          0.167 m LVOT/AV VTI ratio: 0.45 AI PHT:            359 msec  AORTA Ao Root diam: 3.00 cm Ao Asc diam:  4.20 cm MITRAL VALVE                 TRICUSPID VALVE MV Area (PHT): 3.60 cm      TR Peak grad:   23.8 mmHg MV Decel Time: 211 msec      TR Vmax:        244.00 cm/s MR Peak grad:    95.8 mmHg MR Mean grad:    59.5 mmHg   SHUNTS MR Vmax:         489.50 cm/s Systemic VTI:  0.17 m MR Vmean:        364.0 cm/s  Systemic Diam: 1.90 cm MR PISA:         1.01 cm MR PISA Eff ROA: 8 mm MR PISA Radius:  0.40 cm MV E velocity: 141.00 cm/s MV A velocity: 110.00 cm/s MV E/A ratio:  1.28 Mihai Croitoru MD Electronically signed by [name] MD Signature Date/Time: 06/21/2021/3:17:41 PM    Final     (feeding supplement) PROSource Plus  30 mL Oral TID BM   allopurinol  100 mg Oral Once per day on Mon Tue Thu Sat   bictegravir-emtricitabine-tenofovir AF  1 tablet Oral Daily   brimonidine  1 drop Right Eye BID   Chlorhexidine Gluconate Cloth  6 each Topical Q0600   midodrine  10 mg Oral 2 times per day on Tue Thu Sat   midodrine  5  mg Oral Once per day on Sun Mon Wed Fri   montelukast  10 mg Oral QHS   multivitamin  1 tablet Oral  QPM   ofloxacin  1 drop Right Eye QID   pantoprazole (PROTONIX) IV  40 mg Intravenous Daily   sodium zirconium cyclosilicate  10 g Oral Daily   sucroferric oxyhydroxide  500 mg Oral TID with meals    BMET    Component Value Date/Time   NA 139 06/23/2021 0349   NA 142 04/24/2018 1641   K 5.2 (H) 06/23/2021 0349   CL 94 (L) 06/23/2021 0349   CO2 22 06/23/2021 0349   GLUCOSE 120 (H) 06/23/2021 0349   BUN 74 (H) 06/23/2021 0349   BUN 31 (H) 04/24/2018 1641   CREATININE 9.33 (H) 06/23/2021 0349   CREATININE 7.71 (H) 03/19/2019 1140   CALCIUM 9.5 06/23/2021 0349   CALCIUM 8.6 03/14/2016 0925   GFRNONAA 5 (L) 06/23/2021 0349   GFRNONAA 6 (L) 03/19/2019 1140   GFRAA 8 (L) 05/18/2020 0614   GFRAA 7 (L) 03/19/2019 1140   CBC    Component Value Date/Time   WBC 13.1 (H) 06/23/2021 0349   RBC 3.12 (L) 06/23/2021 0349   HGB 11.1 (L) 06/23/2021 0349   HGB 11.1 (L) 03/06/2013 1138   HCT 32.1 (L) 06/23/2021 0349   HCT 34.3 (L) 03/12/2019 0358   HCT 33.8 (L) 03/06/2013 1138   PLT 276 06/23/2021 0349   PLT 177 03/06/2013 1138   MCV 102.9 (H) 06/23/2021 0349   MCV 101.4 (A) 04/24/2018 1742   MCV 95 03/06/2013 1138   MCH 35.6 (H) 06/23/2021 0349   MCHC 34.6 06/23/2021 0349   RDW 15.0 06/23/2021 0349   RDW 13.4 03/06/2013 1138   LYMPHSABS 0.8 06/22/2021 0235   LYMPHSABS 1.5 03/06/2013 1138   MONOABS 2.3 (H) 06/22/2021 0235   EOSABS 0.0 06/22/2021 0235   EOSABS 0.3 03/06/2013 1138   BASOSABS 0.0 06/22/2021 0235   BASOSABS 0.0 03/06/2013 1138     Dialysis Orders:  MTTS at [facility] 4hr, 400/A1.5, EDW 82kg, 2K/2Ca, UFP #2, AVG, heparin 2000 bolus - Hectoral 38mcg IV q HD - Venofer 50mg  IV weekly - Mircera 25mcg IV q 2 weeks   Assessment/Plan:  Lethargy/weakness/UTI: CT/MRI without acute findings. Urine culture + for >100,000 CFU gram negative rods.  Awaiting ID and sensitivities.  Currently on Rocephin.  ESRD:  Will plan for HD today to keep on his MTThS schedule. If any component  of over-medication contributing to current state, hopefully will slightly improve with dialysis. Hyperkalemia - unclear why K is higher than pre-HD yesterday.  Will dose with lokelma 10 grams daily and follow.  Will also use low K bath.  Hypotension/volume: Chronically low BP (on midodrine) and with ^ BNP - UF as tolerated.  Able to UF 2 liters with HD  Anemia: Hgb 12.2- Hold esa for now.   Metabolic bone disease: Ca ok, Phos very high - needs to resume home binders once able to eat.  Nutrition:  Alb very low, to add supplements once eating.  HFrEF: Repeat echo pending.  HIV: Continue home meds.  A-fib: On Eliquis, amiodarone, cardiology following.   [name], MD [employer] [phone]

## 2021-06-23 NOTE — Progress Notes (Signed)
ANTICOAGULATION CONSULT NOTE  Pharmacy Consult for Heparin Indication: atrial fibrillation  Allergies  Allergen Reactions   Dextromethorphan-Guaifenesin Other (See Comments)    Unknown reaction   Tocotrienols Other (See Comments)    Unknown reaction   Losartan Potassium Other (See Comments)    Causes constipation    Patient Measurements: Height: 6\' 2"  (188 cm) Weight: 78.1 kg (172 lb 2.9 oz) IBW/kg (Calculated) : 82.2 Heparin Dosing Weight: 80.6 kg  Vital Signs: Temp: 97.8 F (36.6 C) (07/21 0337) Temp Source: Oral (07/21 0337) BP: 117/59 (07/21 0337) Pulse Rate: 86 (07/21 0337)  Labs: Recent Labs    06/26/2021 1700 07/01/2021 2105 06/21/21 1742 06/21/21 1843 06/22/21 0235 06/22/21 0617 06/22/21 1640 06/23/21 0349  HGB 10.7*   < >  --  12.1* 12.2*  --   --  11.1*  HCT 32.9*   < >  --  37.8* 36.5*  --   --  32.1*  PLT 281   < >  --  214 272  --   --  276  APTT 34  --   --   --  41* 38* 46* 54*  LABPROT 21.3*  --   --   --   --   --   --   --   INR 1.8*  --   --   --   --   --   --   --   HEPARINUNFRC  --   --   --   --   --  >1.10*  --  >1.10*  CREATININE 9.26*   < > 7.23*  --  7.51*  --   --  9.33*  TROPONINIHS 106*  --  127* 128*  --   --   --   --    < > = values in this interval not displayed.     Estimated Creatinine Clearance: 7.1 mL/min (A) (by C-G formula based on SCr of 9.33 mg/dL (H)).   Assessment: 80 year old man with ESRD (TTS HD) admitted on 06/11/2021. Pt has hx of atrial fibrillation, for which he was on apixaban 5 mg PO BID PTA (last dose at 0800 on 7/18, per med rec; pt has not rec'd any doses of apixaban since admission, unable to sit up and swallow pills). Pharmacy was consulted to dose IV heparin for atrial fibrillation. Given recent apixaban exposure, will monitor anticoagulation using aPTT until aPTT and heparin levels correlate.  Heparin level >1.1 (affected by apixaban), aPTT 54 sec (subtherapeutic) on gtt at 1650 units/hr. No issues with  line or bleeding reported per RN.  Goal of Therapy:  Heparin level 0.3-0.7 units/ml aPTT 66-102 sec Monitor platelets by anticoagulation protocol: Yes   Plan:  Increase heparin infusion to 1800 units/hr (no bolus) F/u 8 hr aPTT  Sherlon Handing, PharmD, BCPS Please see amion for complete clinical pharmacist phone list 06/23/2021,4:54 AM

## 2021-06-23 NOTE — Progress Notes (Signed)
Dialysis Nurse's Note  When pt arrived to unit, he was lethargic, drooling and had vomited; vs were stable at the time. This RN contacted pt's bedside nurse to inquire about pt status prior to him leaving for HD. She confirmed that he has had some swallowing issues and has required some oral suctioning periodically; but no vomiting.   Since vs were stable and no further vomiting, HD was initiated but without fluid removal.  Approx 5-10 min into tx, BP dropped to 84/61, HR 51. Began to give fluid to pt, but instead returned blood.   Contacted Dr. Candiss Norse to report pt status and course of HD. Received orders to attempt to dialyze for 2 hours, give albumin 12.5 grams x2 to maintain SBP>90. If pt status worsens, in general to terminate tx and return pt to floor.   Hand-off of pt status and orders given to Norva Riffle RN who will be assuming remainder of pt's care during dialysis tx.

## 2021-06-23 NOTE — Progress Notes (Signed)
PROGRESS NOTE    David [name]  FBP:[id number] DOB: [birth year] DOA: 06/25/2021 PCP: [name], NP   Brief Narrative:  80 year old chronically ill-appearing male with a past medical history significant for but not limited to ESRD on hemodialysis M/T/T/S, Afib on [facility], history of gout, chronic hypotension, GERD, recent admission for right hip fracture status post repair presented from his SNF for generalized weakness and nausea.  Work-up in the ED revealed electrolyte disturbances, elevated BNP greater than 4000, mild pulmonary edema with concern for acute CHF and elevated troponin.  TRH was asked to admit this patient but patient was unable to provide a subjective history due to his lethargy.  He underwent head imaging with a head CT scan as well as a MRI.  Head CT done showed no acute intracranial findings but did show chronic microvascular ischemic changes and cerebral volume loss.  MRI done showed no acute intracranial abnormality and showed grade generalized volume loss and findings of chronic microvascular ischemia. On 7/19, after HD, was found to be in A. fib with RVR, cardiology consulted.  Patient admitted for further management.    Assessment & Plan:   Active Problems:   ESRD (end stage renal disease) on dialysis (HCC)   Acute on chronic systolic CHF (congestive heart failure), NYHA class 4 (HCC)   Atrial fibrillation with RVR (HCC)   Generalized weakness   A-fib (HCC)   Possible acute metabolic encephalopathy  Possible UTI Noted 100.8 fever on 06/21/2021, currently afebrile with leukocytosis UA with large leukocytes, greater than 50 WBC UC growing >100,000 gram neg rods BC x2 NGTD Chest x-ray unremarkable for any infection Procalcitonin elevated at 29 Head CT and MRI brain done showed no acute intracranial findings but did show chronic microvascular ischemic changes and cerebral volume loss.   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 fib on Eliquis Currently in A. 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 Hemoglobin is stable No overt bleeding Iron supplementation by nephrology Daily CBC     DVT prophylaxis: Heparin drip Code Status: FULL CODE  Family Communication: Discussed with Wife at bedside on 06/22/2021 Disposition Plan: Pending further clinical improvement   Status is: Inpatient  The patient will require care spanning > 2 midnights and should be moved to inpatient because: Unsafe d/c plan, IV treatments appropriate due to intensity of illness or inability to take PO, and Inpatient level of care appropriate due to severity of illness  Dispo:  Patient From: [place] Disposition: [place]  Medically stable for discharge: No       Consultants:  Cardiology Nephrology   Procedures: ECHOCARDIOGRAM IMPRESSIONS     1. Left ventricular  ejection fraction, by estimation, is 20 to 25%. The  left ventricle has severely decreased function. The left ventricle  demonstrates global hypokinesis. The left ventricular  internal cavity size  was severely dilated. Left ventricular  diastolic parameters are consistent with Grade II diastolic dysfunction  (pseudonormalization). Elevated left atrial pressure.   2. Right ventricular systolic function is mildly reduced. The right  ventricular size is severely enlarged. There is normal pulmonary artery  systolic pressure. The estimated right ventricular systolic pressure is  74.9 mmHg.   3. Left atrial size was moderately dilated.   4. Right atrial size was mildly dilated.   5. The mitral valve is normal in structure. Severe mitral valve  regurgitation.   6. The aortic valve is tricuspid. Aortic valve regurgitation is mild to  moderate. Mild to moderate aortic valve stenosis.   7. There is mild dilatation of the ascending aorta, measuring 42 mm.   8. The inferior vena cava is dilated in size with >50% respiratory  variability, suggesting right atrial pressure of 8 mmHg.   Comparison(s): Prior images reviewed side by side. The left ventricular  function is unchanged. The right ventricular systolic function is worse.  Mitral regurgitation appears more severe. There is systolic flow reversal  in the pulmonary veins. In a  hemodialysis patient, this may be due to different loading conditions.   FINDINGS   Left Ventricle: Left ventricular ejection fraction, by estimation, is 20  to 25%. The left ventricle has severely decreased function. The left  ventricle demonstrates global hypokinesis. The left ventricular internal  cavity size was severely dilated.  There is no left ventricular hypertrophy. Abnormal (paradoxical) septal  motion, consistent with left bundle branch block. Left ventricular  diastolic parameters are consistent with Grade II diastolic dysfunction  (pseudonormalization). Elevated left atrial   pressure.   Right Ventricle: The right ventricular size is severely enlarged. No  increase in right ventricular wall thickness. Right ventricular  systolic  function is mildly reduced. There is normal pulmonary artery systolic  pressure. The tricuspid regurgitant  velocity is 2.44 m/s, and with an assumed right atrial pressure of 8 mmHg,  the estimated right ventricular systolic pressure is 44.9 mmHg.   Left Atrium: Left atrial size was moderately dilated.   Right Atrium: Right atrial size was mildly dilated.   Pericardium: Trivial pericardial effusion is present.   Mitral Valve: There is diastolic MR due to long AV delay/first degree AV  block. The mitral valve is normal in structure. Severe mitral valve  regurgitation, with centrally-directed jet.   Tricuspid Valve: The tricuspid valve is normal in structure. Tricuspid  valve regurgitation is trivial.   Aortic Valve: The aortic valve is tricuspid. Aortic valve regurgitation is  mild to moderate. Aortic regurgitation PHT measures 359 msec. Mild to  moderate aortic stenosis is present. Aortic valve mean gradient measures  11.0 mmHg. Aortic valve peak  gradient measures 21.3 mmHg. Aortic valve area, by VTI measures 1.26 cm.   Pulmonic Valve: The pulmonic valve was grossly normal. Pulmonic valve  regurgitation is not visualized.   Aorta: The aortic root is normal in size and structure. There is mild  dilatation of the ascending aorta, measuring 42 mm.   Venous: A pattern of systolic flow reversal, suggestive of severe mitral  regurgitation is recorded from the right upper pulmonary vein. The  inferior vena cava is dilated in size with greater than 50% respiratory  variability, suggesting right atrial  pressure of 8 mmHg.   IAS/Shunts: No  atrial level shunt detected by color flow Doppler.      LEFT VENTRICLE  PLAX 2D  LVIDd:         7.07 cm  Diastology  LVIDs:         6.20 cm  LV e' medial:    4.03 cm/s  LV PW:         1.60 cm  LV E/e' medial:  35.0  LV IVS:        1.00 cm  LV e' lateral:   6.42 cm/s  LVOT diam:     1.90 cm  LV E/e' lateral: 22.0  LV SV:         47   LV SV Index:   23  LVOT Area:     2.84 cm      RIGHT VENTRICLE             IVC  RV Basal diam:  3.80 cm     IVC diam: 2.10 cm  RV Mid diam:    2.80 cm  RV S prime:     11.60 cm/s  TAPSE (M-mode): 2.0 cm   LEFT ATRIUM             Index       RIGHT ATRIUM           Index  LA diam:        4.30 cm 2.08 cm/m  RA Area:     18.40 cm  LA Vol (A2C):   79.3 ml 38.37 ml/m RA Volume:   51.50 ml  24.92 ml/m  LA Vol (A4C):   68.8 ml 33.29 ml/m  LA Biplane Vol: 74.4 ml 36.00 ml/m   AORTIC VALVE  AV Area (Vmax):    1.29 cm  AV Area (Vmean):   1.25 cm  AV Area (VTI):     1.26 cm  AV Vmax:           231.00 cm/s  AV Vmean:          157.500 cm/s  AV VTI:            0.375 m  AV Peak Grad:      21.3 mmHg  AV Mean Grad:      11.0 mmHg  LVOT Vmax:         105.00 cm/s  LVOT Vmean:        69.200 cm/s  LVOT VTI:          0.167 m  LVOT/AV VTI ratio: 0.45  AI PHT:            359 msec     AORTA  Ao Root diam: 3.00 cm  Ao Asc diam:  4.20 cm   MITRAL VALVE                 TRICUSPID VALVE  MV Area (PHT): 3.60 cm      TR Peak grad:   23.8 mmHg  MV Decel Time: 211 msec      TR Vmax:        244.00 cm/s  MR Peak grad:    95.8 mmHg  MR Mean grad:    59.5 mmHg   SHUNTS  MR Vmax:         489.50 cm/s Systemic VTI:  0.17 m  MR Vmean:        364.0 cm/s  Systemic Diam: 1.90 cm  MR PISA:         1.01 cm  MR PISA  Eff ROA: 8 mm  MR PISA Radius:  0.40 cm  MV E velocity: 141.00 cm/s  MV A velocity: 110.00 cm/s  MV E/A ratio:  1.28   Antimicrobials:  Anti-infectives (From admission, onward)    Start     Dose/Rate Route Frequency Ordered Stop   06/21/21 2315  cefTRIAXone (ROCEPHIN) 2 g in sodium chloride 0.9 % 100 mL IVPB        2 g 200 mL/hr over 30 Minutes Intravenous Every 24 hours 06/21/21 2227     06/21/21 1000  bictegravir-emtricitabine-tenofovir AF ([name]) 50-200-25 MG per tablet 1 tablet        1 tablet Oral Daily 06/13/2021 2213          Subjective: Patient mentation noted to have  improved, alert, oriented.  Still with generalized weakness, very deconditioned.  Denies any chest pain, abdominal pain, nausea/vomiting, fever/chills.  Objective: Vitals:   06/22/21 1952 06/22/21 2353 06/23/21 0337 06/23/21 1112  BP: 128/70 112/83 (!) 117/59 (!) 106/59  Pulse: 91 96 86 (!) 52  Resp: 19 19 17 20   Temp: 97.9 F (36.6 C) 98.7 F (37.1 C) 97.8 F (36.6 C) 97.8 F (36.6 C)  TempSrc: Oral Oral Oral Oral  SpO2: 98% 100% 100% 99%  Weight:   78.1 kg   Height:        Intake/Output Summary (Last 24 hours) at 06/23/2021 1659 Last data filed at 06/23/2021 1300 Gross per 24 hour  Intake 1274.39 ml  Output --  Net 1274.39 ml   Filed Weights   06/22/21 0017 06/22/21 0408 06/23/21 0337  Weight: 76.8 kg 76.8 kg 78.1 kg   Examination: Physical Exam: General: NAD, very deconditioned, chronically ill-appearing Cardiovascular: S1, S2 present Respiratory: Diminished breath sounds bilaterally Abdomen: Soft, nontender, nondistended, bowel sounds present Musculoskeletal: No bilateral pedal edema noted Skin: Normal Psychiatry: Normal mood     Data Reviewed: I have personally reviewed following labs and imaging studies  CBC: Recent Labs  Lab 06/06/2021 1700 06/06/2021 2105 06/21/21 0814 06/21/21 1136 06/21/21 1843 06/22/21 0235 06/23/21 0349  WBC 9.3 9.7 10.3  --  17.4* 16.6* 13.1*  NEUTROABS 7.4  --  7.9*  --  15.2* 13.4*  --   HGB 10.7* 10.8* 9.8* 10.5* 12.1* 12.2* 11.1*  HCT 32.9* 33.4* 29.6* 31.0* 37.8* 36.5* 32.1*  MCV 108.2* 110.2* 108.4*  --  110.5* 105.5* 102.9*  PLT 281 260 260  --  214 272 540   Basic Metabolic Panel: Recent Labs  Lab 06/18/2021 1700 07/02/2021 2105 06/21/21 0814 06/21/21 1136 06/21/21 1742 06/22/21 0235 06/23/21 0349  NA 137  --  137 137 138 138 139  K 5.4*  --  5.7* 5.4* 5.1 5.3* 5.2*  CL 93*  --  94*  --  94* 93* 94*  CO2 27  --  28  --  26 27 22   GLUCOSE 114*  --  99  --  94 126* 120*  BUN 56*  --  75*  --  44* 44* 74*   CREATININE 9.26* 9.68* 10.68*  --  7.23* 7.51* 9.33*  CALCIUM 8.9  --  8.9  --  9.5 9.6 9.5  MG 2.6*  --  2.7*  --  2.6* 2.6*  --   PHOS 8.5*  --  10.3*  --  7.7* 8.2* 10.4*   GFR: Estimated Creatinine Clearance: 7.1 mL/min (A) (by C-G formula based on SCr of 9.33 mg/dL (H)). Liver Function Tests: Recent Labs  Lab 07/01/2021 1700 06/21/21 0814 06/21/21  1742 06/22/21 0235 06/23/21 0349  AST 29 24 42* 51*  --   ALT 7 6 12 13   --   ALKPHOS 123 105 126 122  --   BILITOT 1.1 1.1 1.7* 1.0  --   PROT 7.0 6.4* 8.2* 7.6  --   ALBUMIN 3.2* 2.9* 3.5 3.1* 2.7*   No results for input(s): LIPASE, AMYLASE in the last 168 hours. Recent Labs  Lab 06/06/2021 1700  AMMONIA 31   Coagulation Profile: Recent Labs  Lab 06/26/2021 1700  INR 1.8*   Cardiac Enzymes: No results for input(s): CKTOTAL, CKMB, CKMBINDEX, TROPONINI in the last 168 hours. BNP (last 3 results) No results for input(s): PROBNP in the last 8760 hours. HbA1C: No results for input(s): HGBA1C in the last 72 hours. CBG: Recent Labs  Lab 06/25/2021 1529 06/14/2021 2212 06/21/21 0831  GLUCAP 116* 111* 110*   Lipid Profile: No results for input(s): CHOL, HDL, LDLCALC, TRIG, CHOLHDL, LDLDIRECT in the last 72 hours. Thyroid Function Tests: Recent Labs    06/22/21 0134  TSH 1.094   Anemia Panel: Recent Labs    06/22/21 0134  VITAMINB12 1,016*   Sepsis Labs: Recent Labs  Lab 06/08/2021 1700 06/19/2021 2105 06/21/21 2318 06/22/21 0235 06/23/21 0349  PROCALCITON  --   --   --   --  29.91  LATICACIDVEN 1.8 1.6 1.8 1.7  --     Recent Results (from the past 240 hour(s))  Resp Panel by RT-PCR (Flu A&B, Covid) Nasopharyngeal Swab     Status: None   Collection Time: 06/16/21  5:04 PM   Specimen: Nasopharyngeal Swab; Nasopharyngeal(NP) swabs in vial transport medium  Result Value Ref Range Status   SARS Coronavirus 2 by RT PCR NEGATIVE NEGATIVE Final    Comment: (NOTE) SARS-CoV-2 target nucleic acids are NOT  DETECTED.  The SARS-CoV-2 RNA is generally detectable in upper respiratory specimens during the acute phase of infection. The lowest concentration of SARS-CoV-2 viral copies this assay can detect is 138 copies/mL. A negative result does not preclude SARS-Cov-2 infection and should not be used as the sole basis for treatment or other patient management decisions. A negative result may occur with  improper specimen collection/handling, submission of specimen other than nasopharyngeal swab, presence of viral mutation(s) within the areas targeted by this assay, and inadequate number of viral copies(<138 copies/mL). A negative result must be combined with clinical observations, patient history, and epidemiological information. The expected result is Negative.  Fact Sheet for Patients:  EntrepreneurPulse.com.au  Fact Sheet for Healthcare Providers:  IncredibleEmployment.be  This test is no t yet approved or cleared by the [place] FDA and  has been authorized for detection and/or diagnosis of SARS-CoV-2 by FDA under an Emergency Use Authorization (EUA). This EUA will remain  in effect (meaning this test can be used) for the duration of the COVID-19 declaration under Section 564(b)(1) of the Act, 21 U.S.C.section 360bbb-3(b)(1), unless the authorization is terminated  or revoked sooner.       Influenza A by PCR NEGATIVE NEGATIVE Final   Influenza B by PCR NEGATIVE NEGATIVE Final    Comment: (NOTE) The Xpert Xpress SARS-CoV-2/FLU/RSV plus assay is intended as an aid in the diagnosis of influenza from Nasopharyngeal swab specimens and should not be used as a sole basis for treatment. Nasal washings and aspirates are unacceptable for Xpert Xpress SARS-CoV-2/FLU/RSV testing.  Fact Sheet for Patients: EntrepreneurPulse.com.au  Fact Sheet for Healthcare Providers: IncredibleEmployment.be  This test is not yet  approved or  cleared by the [place] and has been authorized for detection and/or diagnosis of SARS-CoV-2 by FDA under an Emergency Use Authorization (EUA). This EUA will remain in effect (meaning this test can be used) for the duration of the COVID-19 declaration under Section 564(b)(1) of the Act, 21 U.S.C. section 360bbb-3(b)(1), unless the authorization is terminated or revoked.  Performed at [facility], [place] [address], [place], [place] [address]   Blood culture (routine x 2)     Status: None (Preliminary result)   Collection Time: 06/24/2021  3:53 PM   Specimen: BLOOD  Result Value Ref Range Status   Specimen Description BLOOD SITE NOT SPECIFIED  Final   Special Requests   Final    BOTTLES DRAWN AEROBIC AND ANAEROBIC Blood Culture results may not be optimal due to an excessive volume of blood received in culture bottles IV START   Culture   Final    NO GROWTH 3 DAYS Performed at [facility], [place] [address] [place], [place] [address]    Report Status PENDING  Incomplete  SARS CORONAVIRUS 2 (TAT 6-24 HRS) Nasopharyngeal Nasopharyngeal Swab     Status: None   Collection Time: 06/29/2021  4:50 PM   Specimen: Nasopharyngeal Swab  Result Value Ref Range Status   SARS Coronavirus 2 NEGATIVE NEGATIVE Final    Comment: (NOTE) SARS-CoV-2 target nucleic acids are NOT DETECTED.  The SARS-CoV-2 RNA is generally detectable in upper and lower respiratory specimens during the acute phase of infection. Negative results do not preclude SARS-CoV-2 infection, do not rule out co-infections with other pathogens, and should not be used as the sole basis for treatment or other patient management decisions. Negative results must be combined with clinical observations, patient history, and epidemiological information. The expected result is Negative.  Fact Sheet for Patients: SugarRoll.be  Fact Sheet for Healthcare  Providers: https://www.woods-mathews.com/  This test is not yet approved or cleared by the [place] FDA and  has been authorized for detection and/or diagnosis of SARS-CoV-2 by FDA under an Emergency Use Authorization (EUA). This EUA will remain  in effect (meaning this test can be used) for the duration of the COVID-19 declaration under Se ction 564(b)(1) of the Act, 21 U.S.C. section 360bbb-3(b)(1), unless the authorization is terminated or revoked sooner.  Performed at [facility], [place] [address] [place] [address], [place], [place] [address]   Blood culture (routine x 2)     Status: None (Preliminary result)   Collection Time: 06/25/2021  9:08 PM   Specimen: BLOOD  Result Value Ref Range Status   Specimen Description BLOOD LEFT HAND  Final   Special Requests   Final    BOTTLES DRAWN AEROBIC AND ANAEROBIC Blood Culture adequate volume   Culture   Final    NO GROWTH 3 DAYS Performed at [facility], [place] [address], [place], [place] [address]    Report Status PENDING  Incomplete  Culture, blood (routine x 2)     Status: None (Preliminary result)   Collection Time: 06/21/21 11:07 PM   Specimen: BLOOD LEFT HAND  Result Value Ref Range Status   Specimen Description BLOOD LEFT HAND  Final   Special Requests   Final    BOTTLES DRAWN AEROBIC ONLY Blood Culture results may not be optimal due to an inadequate volume of blood received in culture bottles   Culture   Final    NO GROWTH 1 DAY Performed at [facility], [place]  [address] [place], [place] [address]    Report Status PENDING  Incomplete  Culture, blood (routine x 2)     Status: None (Preliminary result)   Collection Time: 06/21/21 11:14 PM   Specimen: BLOOD  Result Value Ref Range Status   Specimen Description BLOOD LEFT THUMB  Final   Special Requests   Final    BOTTLES DRAWN AEROBIC ONLY Blood Culture results may not be optimal due to an inadequate volume of blood received in culture bottles   Culture    Final    NO GROWTH 1 DAY Performed at [facility], [place] [address] [place], [place] [address]    Report Status PENDING  Incomplete  Urine Culture     Status: Abnormal (Preliminary result)   Collection Time: 06/22/21 10:02 AM   Specimen: Urine, Clean Catch  Result Value Ref Range Status   Specimen Description URINE, CLEAN CATCH  Final   Special Requests   Final    NONE Performed at [facility], [place] [address], [place], [place] [address]    Culture >=100,000 COLONIES/mL GRAM NEGATIVE RODS (A)  Final   Report Status PENDING  Incomplete    RN Pressure Injury Documentation:     Estimated body mass index is 22.11 kg/m as calculated from the following:   Height as of this encounter: 6\' 2"  (1.88 m).   Weight as of this encounter: 78.1 kg.  Malnutrition Type: Nutrition Problem: Increased nutrient needs Etiology: chronic illness (ESRD on HD) Malnutrition Characteristics: Signs/Symptoms: estimated needs Nutrition Interventions: Interventions: MVI, Prostat, Magic cup  Radiology Studies: DG CHEST PORT 1 VIEW  Result Date: 06/22/2021 CLINICAL DATA:  Shortness of breath. EXAM: PORTABLE CHEST 1 VIEW COMPARISON:  06/21/2021. FINDINGS: Stable severe cardiomegaly. No pulmonary venous congestion. No focal infiltrate. No pleural effusion or pneumothorax. Degenerative change thoracic spine. IMPRESSION: 1.  Stable cardiomegaly.  No pulmonary venous congestion. 2.  No acute pulmonary disease. Electronically Signed   By: Marcello Moores  Register   On: 06/22/2021 06:28   DG CHEST PORT 1 VIEW  Result Date: 06/21/2021 CLINICAL DATA:  Fever. EXAM: PORTABLE CHEST 1 VIEW COMPARISON:  Radiograph yesterday. FINDINGS: Stable enlarged cardiac silhouette. Unchanged mediastinal contours. Slight increasing vascular congestion. No confluent consolidation. Minor right lung base atelectasis. No pneumothorax or pleural effusion. No acute osseous abnormalities. IMPRESSION: 1. Stable cardiomegaly. Slight  increasing vascular congestion. 2. Minor right lung base atelectasis. Electronically Signed   By: Keith Rake M.D.   On: 06/21/2021 22:48   EEG adult  Result Date: 06/22/2021 Lora Havens, MD     06/22/2021  3:34 PM Patient Name: Laura Radilla MRN: [id number] Epilepsy Attending: Lora Havens Referring Physician/Provider: Dr Carlisle Cater Date: 06/22/2021 Duration: 24.32 mins Patient history: 80 year old male with altered mental status.  EEG to evaluate for seizures. Level of alertness: Awake, asleep AEDs during EEG study: None Technical aspects: This EEG study was done with scalp electrodes positioned according to the 10-20 International system of electrode placement. Electrical activity was acquired at a sampling rate of 500Hz  and reviewed with a high frequency filter of 70Hz  and a low frequency filter of 1Hz . EEG data were recorded continuously and digitally stored. Description: No clear posterior dominant rhythm was seen. Sleep was characterized by vertex waves, sleep spindles (12 to 14 Hz), maximal frontocentral region.  EEG also showed continuous 6-8 Hz theta-alpha activity as well as intermittent generalized 2 to 3 Hz delta slowing.  Hyperventilation and photic stimulation were not performed.  ABNORMALITY - Continuous slow, generalized IMPRESSION: This study is suggestive of moderate diffuse encephalopathy, nonspecific etiology. No seizures or epileptiform discharges were seen throughout the recording. Priyanka Barbra Sarks    Scheduled Meds:  (feeding supplement) PROSource Plus  30 mL Oral TID BM   allopurinol  100 mg Oral Once per day on Mon Tue Thu Sat   amiodarone  200 mg Oral Daily   bictegravir-emtricitabine-tenofovir AF  1 tablet Oral Daily   brimonidine  1 drop Right Eye BID   Chlorhexidine Gluconate Cloth  6 each Topical Q0600   midodrine  10 mg Oral 2 times per day on Tue Thu Sat   midodrine  5 mg Oral Once per day on Sun Mon Wed Fri   montelukast  10 mg Oral QHS    multivitamin  1 tablet Oral QPM   ofloxacin  1 drop Right Eye QID   pantoprazole (PROTONIX) IV  40 mg Intravenous Daily   sodium zirconium cyclosilicate  10 g Oral Daily   sucroferric oxyhydroxide  500 mg Oral TID with meals   Continuous Infusions:  cefTRIAXone (ROCEPHIN)  IV 2 g (06/22/21 2213)   heparin 2,000 Units/hr (06/23/21 1435)    LOS: 1 day   Alma Friendly, MD [facility] PAGER is on [place]  If 7PM-7AM, please contact night-coverage [email or web address]

## 2021-06-23 NOTE — Therapy (Signed)
Occupational Therapy Evaluation Patient Details Name: David [name] MRN: [id number] DOB: 1941-10-12 Today's Date: 06/23/2021    History of Present Illness Pt is a 80 y.o. male recently admitted after fall s/p R hip pinning 06/12/21 (to be 50% PWB) with d/c to [facility], now admitted from HD on 06/29/2021 with generalized weakness, nausea, ongoing lethargy; unknown etiology, possible UTI. CXR with no acute disease. Brain MRI negative for acute abnormality. EEG on 7/20 suggestive of moderate diffuse encephalopathy; no seizures noted. PMH includes L hip OA, ESRD (HD TTS), HTN, CKD V, afib, stroke, HIV, bradycardia, cardiomyopathy, prostate CA, glaucoma, gout, COVID-19.   Clinical Impression   Pt admitted as above from SNF Rehab. Pt presents with an overall decrease in functional mobility secondary to above.PTA, pt with recent admit after fall s/p R hip pinning (06/12/21) and d/c to SNF for post-acute rehab. Pt w/ increased cognitive awareness this session. He was alert & oriented to person, place, date/time, but not situation. During visit he later stated "I think I got an infection or something". Chart review indicates that pt was mod I with RW, lives w/ wife prior to initial admission. Today, pt is total assist +2/dependent LB ADL's, bed mobility and is inconsistent with following 1 step commands for UE ROM. Pt should benefit from SNF Rehab to assist w/ decreased burden of care and increased participation with ADL/functional mobility transfers related to self care.    Follow Up Recommendations  SNF;Supervision/Assistance - 24 hour    Equipment Recommendations  Other (comment) (Defer to next venue)    Recommendations for Other Services Other (comment) (SNF rehab/24 assist)     Precautions / Restrictions Precautions Precautions: Fall;Other (comment) Precaution Comments: Bladder/bowel incontinence Restrictions Weight Bearing Restrictions: Yes RLE Weight Bearing: Partial weight  bearing RLE Partial Weight Bearing Percentage or Pounds: 50      Mobility Bed Mobility Overal bed mobility: Needs Assistance Bed Mobility: Rolling Rolling: Total assist;+2 for physical assistance;+2 for safety/equipment         General bed mobility comments: Pt is answering questions appropriately today and inconsistent following 1 step commands. He is +2 total assist for bed level mobility and therefore OOB transfers were not attempted secondary to safety.    Transfers                      Balance Overall balance assessment: History of Falls                                         ADL either performed or assessed with clinical judgement   ADL Overall ADL's : Needs assistance/impaired     Grooming: Wash/dry hands;Wash/dry face;Moderate assistance;Maximal assistance;Bed level (Hand over hand technique, pt w/ difficulty moving R dominant UE against gravity at bed level, however, he did attempt.) Grooming Details (indicate cue type and reason): Hand over hand technique. Pt states - UE's are heavy Upper Body Bathing: Total assistance;Bed level;Cueing for sequencing   Lower Body Bathing: Total assistance;+2 for physical assistance;+2 for safety/equipment;Cueing for sequencing;Cueing for safety;Bed level (Pt had just been cleaned by NT x2 following BM in bed upon OT arrival.)   Upper Body Dressing : Maximal assistance;Bed level;Cueing for sequencing Upper Body Dressing Details (indicate cue type and reason): Verbal and tactile Lower Body Dressing: Total assistance;+2 for physical assistance;+2 for safety/equipment;Cueing for sequencing;Cueing for safety;Bed level   Toilet Transfer: Total  assistance;+2 for physical assistance;+2 for safety/equipment (Not able to transfer OOB: Pt is total assist & incontinent of bowel/bladder this admission. Attempts to roll over in bed but is +2 total assist/)   Toileting- Clothing Manipulation and Hygiene: +2 for physical  assistance;+2 for safety/equipment;Total assistance;Cueing for sequencing;Bed level       Functional mobility during ADLs: Maximal assistance;Total assistance;+2 for physical assistance;+2 for safety/equipment;Cueing for safety;Cueing for sequencing (Bed mobility) General ADL Comments: Pt is currently Total assist +2 for all LB ADL's at bed level. He is more responsive and answering questions appropriately today. Mod-Max hand over hand assist to wash face and hands at bed level today.      Pertinent Vitals/Pain Pain Assessment: Faces Faces Pain Scale: Hurts even more Pain Location: Upper and LE's w/ ROM/mobility Pain Descriptors / Indicators: Operative site guarding;Grimacing;Guarding Pain Intervention(s): Limited activity within patient's tolerance;Monitored during session;Repositioned     Hand Dominance Right   Extremity/Trunk Assessment Upper Extremity Assessment Upper Extremity Assessment: Generalized weakness RUE Deficits / Details: Pt with PROM bilat UE's w/ noted facial grimmace initially for fingers, wrist, forearm WFL's. Elbow and shoulders appear stiff ~60% of range passively before facial grimmace   Lower Extremity Assessment Lower Extremity Assessment: Defer to PT evaluation       Communication Communication Communication: Expressive difficulties;Other (comment) (Soft spoken, able to answer questions with increased time)   Cognition Arousal/Alertness: Awake/alert;Lethargic Behavior During Therapy: Flat affect (Soft spoken, fatigued) Overall Cognitive Status: No family/caregiver present to determine baseline cognitive functioning                        General Comments: Pt with eyes open the majority of session; able to answer first name, place, year, date and respond minimally to basic question today; inconsistently following one-step commands with increased time.   General Comments  Pt aware of bowel/bladder incontinence, but dependent on staff to  initiate hygiene and care. Total A +2    Exercises Other Exercises Other Exercises: Gentle P/ROM bilateral UE's to fingers, wrists, forearm, elbows and shoulders.   Shoulder Instructions      Home Living Family/patient expects to be discharged to:: Skilled nursing facility Living Arrangements: Spouse/significant other Available Help at Discharge: Family;Available PRN/intermittently Type of Home: House Home Access: Stairs to enter;Ramped entrance Entrance Stairs-Number of Steps: 3 Entrance Stairs-Rails: Right Home Layout: One level     Bathroom Shower/Tub: Walk-in Hydrologist:  (Pt states comfort height, chart review indicates standard.) Bathroom Accessibility: Yes   Home Equipment: Walker - 2 wheels;Shower seat (Pt reports shower seat at home)   Additional Comments: Patient recently admitted with d/c to [facility] SNF for rehab on 06/17/21. Per chart, pt originally from home with spouse      Prior Functioning/Environment Level of Independence: Needs assistance  Gait / Transfers Assistance Needed: Chart review/discussion w/ PT:  poor historian, sounds like he has not stood since admit to [facility] 06/17/21. Per chart, prior to fall leading to admission, pt mod indep ambulator with RW ADL's / Homemaking Assistance Needed: Likely assist for all ADL tasks from SNF staff. Per chart, pt typically mod indep with toileting and bathing; wife assisted with lower body dressing            OT Problem List: Decreased strength;Decreased activity tolerance;Impaired balance (sitting and/or standing);Decreased cognition;Decreased safety awareness;Decreased knowledge of use of DME or AE;Decreased knowledge of precautions;Pain      OT Treatment/Interventions: Self-care/ADL training;Therapeutic exercise;DME and/or AE instruction;Therapeutic  activities;Cognitive remediation/compensation;Patient/family education    OT Goals(Current goals can be found in the care plan section) Acute  Rehab OT Goals Patient Stated Goal: Unable to state OT Goal Formulation: Patient unable to participate in goal setting Time For Goal Achievement: 07/07/21 Potential to Achieve Goals: Fair ADL Goals Pt Will Perform Grooming: with min assist;bed level;sitting Pt Will Perform Upper Body Bathing: with min assist;sitting;bed level Pt Will Perform Lower Body Bathing: with max assist;sitting/lateral leans;sit to/from stand Pt Will Perform Upper Body Dressing: with min assist;sitting;bed level Pt Will Perform Lower Body Dressing: with max assist;sitting/lateral leans;sit to/from stand Pt Will Transfer to Toilet: with max assist;bedside commode;stand pivot transfer Pt Will Perform Toileting - Clothing Manipulation and hygiene: with max assist;sitting/lateral leans;sit to/from stand  OT Frequency: Min 2X/week    AM-PAC OT "6 Clicks" Daily Activity     Outcome Measure Help from another person eating meals?: A Lot Help from another person taking care of personal grooming?: A Lot Help from another person toileting, which includes using toliet, bedpan, or urinal?: Total Help from another person bathing (including washing, rinsing, drying)?: Total Help from another person to put on and taking off regular upper body clothing?: A Lot Help from another person to put on and taking off regular lower body clothing?: Total 6 Click Score: 9   End of Session Nurse Communication: Mobility status  Activity Tolerance: Patient limited by fatigue;Patient limited by lethargy Patient left: in bed;with call bell/phone within reach  OT Visit Diagnosis: History of falling (Z91.81);Muscle weakness (generalized) (M62.81);Other symptoms and signs involving cognitive function;Pain;Other abnormalities of gait and mobility (R26.89) Pain - Right/Left:  (Bilateral UE's facial grimmace with gentle P/ROM bed level. R LE facial grimmace with ROM.) Pain - part of body:  (UE/LE stiffness)                Time: 3338-3291 OT Time  Calculation (min): 16 min Charges:  OT General Charges $OT Visit: 1 Visit OT Evaluation $OT Eval Moderate Complexity: 1 Mod  Fatim Vanderschaaf Beth Dixon, OTR/L 06/23/2021, 12:52 PM

## 2021-06-23 NOTE — Progress Notes (Signed)
  Speech Language Pathology Treatment: Dysphagia  Patient Details Name: David [name] MRN: [id number] DOB: [birth year] Today's Date: 06/23/2021 Time: 6948-5462 SLP Time Calculation (min) (ACUTE ONLY): 10 min  Assessment / Plan / Recommendation Clinical Impression  Pt was seen for dysphagia treatment. He was alert and cooperative during the session, but presented with reduced processing speed and delayed responses. Pt reported that he is typically a slow eater and that it takes him ~60 minutes to finish a meal. He stated that meals have been easier than PTA. However, some inconsistency has been noted in his reports and his reliability as a historian is questioned. Pt's nurse reported that the pt has been tolerating meds whole with liquids and the majority of his meals, but that he "sometimes almost chokes" during meals. Pt demonstrated prolonged mastication and extended lingual manipulation of masticated boluses. Mild oral residue was noted with regular textures and cleared with secondary swallows and/or a liquid wash. Pt tolerated thin liquids, puree, and dysphagia 2 solids without overt s/sx of aspiration and with adequate oral clearance. Pt's current diet of dysphagia 2 solids and thin liquids will be continued and SLP will continue to follow pt.    HPI HPI: Pt arrived from SNF due to generalized weakness and nausea and ongoing lethargy. Pt has a history of chronic illness, HIV, ESRD on HD, GERD, Gout, HTN, recent right hip fx. SLP ordered due to pt lethargy to determine safety with diet. CXR and MRI WNL.      SLP Plan  Continue with current plan of care       Recommendations  Diet recommendations: Dysphagia 2 (fine chop);Thin liquid Liquids provided via: Cup;Straw Medication Administration: Whole meds with liquid Supervision: Staff to assist with self feeding Compensations: Slow rate;Small sips/bites;Minimize environmental distractions Postural Changes and/or Swallow Maneuvers:  Seated upright 90 degrees                Oral Care Recommendations: Oral care BID Follow up Recommendations:  (TBD) SLP Visit Diagnosis: Dysphagia, unspecified (R13.10) Plan: Continue with current plan of care       Gyanna Jarema I. Hardin Negus, [place], [place] Office number [phone] Pager [place] 06/23/2021, 2:52 PM

## 2021-06-23 NOTE — Progress Notes (Signed)
ANTICOAGULATION CONSULT NOTE  Pharmacy Consult for Heparin Indication: atrial fibrillation  Allergies  Allergen Reactions   Dextromethorphan-Guaifenesin Other (See Comments)    Unknown reaction   Tocotrienols Other (See Comments)    Unknown reaction   Losartan Potassium Other (See Comments)    Causes constipation    Patient Measurements: Height: 6\' 2"  (188 cm) Weight: 78.1 kg (172 lb 2.9 oz) IBW/kg (Calculated) : 82.2 Heparin Dosing Weight: 80.6 kg  Vital Signs: Temp: 97.8 F (36.6 C) (07/21 1112) Temp Source: Oral (07/21 1112) BP: 106/59 (07/21 1112) Pulse Rate: 52 (07/21 1112)  Labs: Recent Labs    06/07/2021 1700 06/18/2021 2105 06/21/21 1742 06/21/21 1843 06/22/21 0235 06/22/21 0617 06/22/21 1640 06/23/21 0349 06/23/21 1302  HGB 10.7*   < >  --  12.1* 12.2*  --   --  11.1*  --   HCT 32.9*   < >  --  37.8* 36.5*  --   --  32.1*  --   PLT 281   < >  --  214 272  --   --  276  --   APTT 34  --   --   --  41* 38* 46* 54* 57*  LABPROT 21.3*  --   --   --   --   --   --   --   --   INR 1.8*  --   --   --   --   --   --   --   --   HEPARINUNFRC  --   --   --   --   --  >1.10*  --  >1.10*  --   CREATININE 9.26*   < > 7.23*  --  7.51*  --   --  9.33*  --   TROPONINIHS 106*  --  127* 128*  --   --   --   --   --    < > = values in this interval not displayed.     Estimated Creatinine Clearance: 7.1 mL/min (A) (by C-G formula based on SCr of 9.33 mg/dL (H)).   Assessment: 80 year old man with ESRD (TTS HD) admitted on 06/17/2021. Pt has hx of atrial fibrillation, for which he was on apixaban 5 mg PO BID PTA (last dose at 0800 on 7/18, per med rec; pt has not rec'd any doses of apixaban since admission, unable to sit up and swallow pills).   Pharmacy was consulted to dose IV heparin for atrial fibrillation. Given recent apixaban exposure, will monitor anticoagulation using aPTT until aPTT and heparin levels correlate.  Verified with RN no issues with line or bleeding  reported. Heparin level >1.1 (affected by apixaban), aPTT 57 sec and still remains subtherapeutic despite 2 increases, currently on heparin gtt at 1800 units/hr. Will increase to 2000 units/hr.  Goal of Therapy:  Heparin level 0.3-0.7 units/ml aPTT 66-102 sec Monitor platelets by anticoagulation protocol: Yes   Plan:  Increase heparin infusion to 2000 units/hr. Follow-up 8 hr aPTT @ [place], PharmD 06/23/2021,2:23 PM

## 2021-06-24 DIAGNOSIS — I5023 Acute on chronic systolic (congestive) heart failure: Secondary | ICD-10-CM | POA: Diagnosis not present

## 2021-06-24 DIAGNOSIS — I4891 Unspecified atrial fibrillation: Secondary | ICD-10-CM | POA: Diagnosis not present

## 2021-06-24 DIAGNOSIS — N186 End stage renal disease: Secondary | ICD-10-CM | POA: Diagnosis not present

## 2021-06-24 DIAGNOSIS — I4819 Other persistent atrial fibrillation: Secondary | ICD-10-CM | POA: Diagnosis not present

## 2021-06-24 DIAGNOSIS — R531 Weakness: Secondary | ICD-10-CM | POA: Diagnosis not present

## 2021-06-24 LAB — RENAL FUNCTION PANEL
Albumin: 2.6 g/dL — ABNORMAL LOW (ref 3.5–5.0)
Anion gap: 17 — ABNORMAL HIGH (ref 5–15)
BUN: 65 mg/dL — ABNORMAL HIGH (ref 8–23)
CO2: 26 mmol/L (ref 22–32)
Calcium: 9.1 mg/dL (ref 8.9–10.3)
Chloride: 94 mmol/L — ABNORMAL LOW (ref 98–111)
Creatinine, Ser: 7.75 mg/dL — ABNORMAL HIGH (ref 0.61–1.24)
GFR, Estimated: 7 mL/min — ABNORMAL LOW (ref >60)
Glucose, Bld: 123 mg/dL — ABNORMAL HIGH (ref 70–99)
Phosphorus: 7.7 mg/dL — ABNORMAL HIGH (ref 2.5–4.6)
Potassium: 4.1 mmol/L (ref 3.5–5.1)
Sodium: 137 mmol/L (ref 135–145)

## 2021-06-24 LAB — CBC
HCT: 28.4 % — ABNORMAL LOW (ref 39.0–52.0)
Hemoglobin: 9.6 g/dL — ABNORMAL LOW (ref 13.0–17.0)
MCH: 34.5 pg — ABNORMAL HIGH (ref 26.0–34.0)
MCHC: 33.8 g/dL (ref 30.0–36.0)
MCV: 102.2 fL — ABNORMAL HIGH (ref 80.0–100.0)
Platelets: 276 10*3/uL (ref 150–400)
RBC: 2.78 MIL/uL — ABNORMAL LOW (ref 4.22–5.81)
RDW: 15 % (ref 11.5–15.5)
WBC: 9.7 10*3/uL (ref 4.0–10.5)
nRBC: 0 % (ref 0.0–0.2)

## 2021-06-24 LAB — PROCALCITONIN: Procalcitonin: 26.07 ng/mL

## 2021-06-24 LAB — URINE CULTURE: Culture: >=100000 — AB

## 2021-06-24 LAB — HEPARIN LEVEL (UNFRACTIONATED): Heparin Unfractionated: 1.08 IU/mL — ABNORMAL HIGH (ref 0.30–0.70)

## 2021-06-24 LAB — APTT: aPTT: 67 seconds — ABNORMAL HIGH (ref 24–36)

## 2021-06-24 LAB — T.PALLIDUM AB, TOTAL: T Pallidum Abs: REACTIVE — AB

## 2021-06-24 MED ORDER — APIXABAN 5 MG PO TABS
5.0000 mg | ORAL_TABLET | Freq: Two times a day (BID) | ORAL | Status: DC
  Administered 2021-06-24 – 2021-06-25 (×2): 5 mg via ORAL
  Filled 2021-06-24 (×2): qty 1

## 2021-06-24 MED ORDER — MIDODRINE HCL 5 MG PO TABS: 10.0000 mg | ORAL_TABLET | ORAL | Status: DC

## 2021-06-24 MED ORDER — SODIUM CHLORIDE 0.9 % IV SOLN
3.0000 g | Freq: Two times a day (BID) | INTRAVENOUS | Status: DC
  Administered 2021-06-24 – 2021-06-26 (×5): 3 g via INTRAVENOUS
  Filled 2021-06-24 (×8): qty 8

## 2021-06-24 NOTE — Progress Notes (Signed)
Physical Therapy Treatment Patient Details Name: David [name] MRN: [id number] DOB: 1941/01/16 Today's Date: 06/24/2021    History of Present Illness Pt is a 80 y.o. male recently admitted after fall s/p R hip pinning 06/12/21 (to be 50% PWB) with d/c to [facility], now admitted from HD on 06/05/2021 with generalized weakness, nausea, ongoing lethargy; unknown etiology, possible UTI. CXR with no acute disease. Brain MRI negative for acute abnormality. EEG on 7/20 suggestive of moderate diffuse encephalopathy; no seizures noted. PMH includes L hip OA, ESRD (HD TTS), HTN, CKD V, afib, stroke, HIV, bradycardia, cardiomyopathy, prostate CA, glaucoma, gout, COVID-19.   PT Comments    Pt more alert this session, remains inconsistent with command following and participation, requires totalA+2 for bed mobility. Pt with bowel incontinence, dependent for pericare; maximove lift to recliner deferred secondary to bouts of incontinence and pt unable to communicate when soiled. Pt able to tolerate limited PROM to R hip/knee, minimal active movement noted. Repositioned to encourage upright posture and pressure relief (RN to order prevalon boots for bilateral heels). Continue to recommend SNF-level therapies pending pt progression and activity tolerance.   Follow Up Recommendations  SNF;Supervision for mobility/OOB     Equipment Recommendations  Wheelchair;Wheelchair cushion;Hospital bed;Mechanical lift   Recommendations for Other Services       Precautions / Restrictions Precautions Precautions: Fall;Other (comment) Precaution Comments: Bladder/bowel incontinence, watch BP Restrictions Weight Bearing Restrictions: Yes RLE Weight Bearing: Partial weight bearing RLE Partial Weight Bearing Percentage or Pounds: 50%    Mobility  Bed Mobility Overal bed mobility: Needs Assistance Bed Mobility: Rolling Rolling: Total assist;+2 for physical assistance;+2 for safety/equipment         General  bed mobility comments: Rolling R/L for pericare and linen change secondary to bowel incontinence; pt able to minimally assist with BUE support with max verbal/tactile cues for UE positioning on rails and external assist for placement; ultimately requiring totalA+2 for trunk/hip rotation and repositioning; totalA+2 to slide up in bed and placed in partial chair position    Transfers                 General transfer comment: planned for maximove lift to recliner, deferred secondary to bouts of diarrhea incontinence and pt unable to notify staff when soiled  Ambulation/Gait                 Stairs             Wheelchair Mobility    Modified Rankin (Stroke Patients Only)       Balance                                            Cognition Arousal/Alertness: Awake/alert;Lethargic Behavior During Therapy: Flat affect Overall Cognitive Status: No family/caregiver present to determine baseline cognitive functioning Area of Impairment: Orientation;Attention;Memory;Following commands;Safety/judgement;Awareness;Problem solving                   Current Attention Level: Sustained;Focused Memory: Decreased recall of precautions;Decreased short-term memory Following Commands: Follows one step commands inconsistently;Follows one step commands with increased time Safety/Judgement: Decreased awareness of safety;Decreased awareness of deficits Awareness: Intellectual Problem Solving: Slow processing;Decreased initiation;Difficulty sequencing;Requires verbal cues;Requires tactile cues General Comments: Alert and responsive majority of session, less so towards end of session suspect related to fatigue, starting to keep eyes closed, although pt denies fatigue. Inconsistent command following  with increased time; appears to answer majority of yes/no questions appropriately, aware of bowel incontinence but had not called for assistance (pt likely cannot sequence  call bell)      Exercises Other Exercises Other Exercises: PROM R hip/knee flex/ext/adb/add - pt able to minimally perform ankle pumps, minimal active hip/knee extension AAROM    General Comments General comments (skin integrity, edema, etc.): Pt c/o heel discomfort - bilateral heels elevated off bed surface for pressure relief; spoke with RN who will order prevalon boots. Post-mobility BP 104/82, HR 90s-110s, SpO2 >/96% on RA      Pertinent Vitals/Pain Pain Assessment: Faces Faces Pain Scale: Hurts little more Pain Location: RLE with PROM, bilateral heels Pain Descriptors / Indicators: Operative site guarding;Grimacing;Guarding Pain Intervention(s): Limited activity within patient's tolerance;Monitored during session;Repositioned (heels elevated off bed with pillow; RN to order prevalon boots)    Home Living                      Prior Function            PT Goals (current goals can now be found in the care plan section) Progress towards PT goals: Not progressing toward goals - comment (limited by pain, fatigue, cognition)    Frequency    Min 2X/week      PT Plan Current plan remains appropriate    Co-evaluation PT/OT/SLP Co-Evaluation/Treatment: Yes Reason for Co-Treatment: Complexity of the patient's impairments (multi-system involvement);Necessary to address cognition/behavior during functional activity;For patient/therapist safety;To address functional/ADL transfers PT goals addressed during session: Mobility/safety with mobility        AM-PAC PT "6 Clicks" Mobility   Outcome Measure  Help needed turning from your back to your side while in a flat bed without using bedrails?: Total Help needed moving from lying on your back to sitting on the side of a flat bed without using bedrails?: Total Help needed moving to and from a bed to a chair (including a wheelchair)?: Total Help needed standing up from a chair using your arms (e.g., wheelchair or bedside  chair)?: Total Help needed to walk in hospital room?: Total Help needed climbing 3-5 steps with a railing? : Total 6 Click Score: 6    End of Session   Activity Tolerance: Patient limited by fatigue Patient left: in bed;with call bell/phone within reach;with bed alarm set Nurse Communication: Mobility status;Need for lift equipment PT Visit Diagnosis: Muscle weakness (generalized) (M62.81);Pain;Other abnormalities of gait and mobility (R26.89) Pain - Right/Left: Right Pain - part of body: Leg     Time: 2458-0998 PT Time Calculation (min) (ACUTE ONLY): 27 min  Charges:  $Therapeutic Activity: 8-22 mins                     Mabeline Caras, PT, DPT Acute Rehabilitation Services  Pager [phone] Office [place] 06/24/2021, 2:33 PM

## 2021-06-24 NOTE — NC FL2 (Addendum)
[place] LEVEL OF CARE SCREENING TOOL     IDENTIFICATION  Patient Name: David [name] Birthdate: Mar 28, 1941 Sex: male Admission Date (Current Location): 06/26/2021  [facility] and [place] Number:  Herbalist and Address:  [facility] [place], [place] [address] [place], [place] [address]      Provider Number: [id number]  Attending Physician Name and Address:  Alma Friendly, MD  Relative Name and Phone Number:  Calob, Baskette (Spouse)   [phone]    Current Level of Care: Hospital Recommended Level of Care: [place] Prior Approval Number:    Date Approved/Denied:   PASRR Number: [id number]  Discharge Plan: SNF    Current Diagnoses: Patient Active Problem List   Diagnosis Date Noted   A-fib [facility]) 06/22/2021   Generalized weakness 06/27/2021   Subcapital fracture of hip, right, closed, initial encounter ([place]) 06/14/2021   S/P right hip fracture 06/10/2021   Gait disorder 53/97/6734   Complication of vascular dialysis catheter 09/29/2020   Disease of gallbladder, unspecified 05/18/2020   Elevated LFTs    Abdominal pain    Transaminitis 05/15/2020   Full code status 02/03/2020   Subtherapeutic international normalized ratio (INR) 01/24/2020   Pressure injury of skin 01/07/2020   Combined systolic and diastolic heart failure ([place])    COVID-19 12/11/2019   Acute respiratory failure with hypoxemia ([place]) 11/01/2019   Closed fracture of nasal bones 09/10/2019   Hyperkalemia 09/02/2019   Shortness of breath 04/12/2019   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   Dependence on renal dialysis ([place]) 03/27/2019   Hypertensive heart and chronic kidney disease with heart failure and with stage 5 chronic kidney disease, or end stage renal disease ([place]) 03/27/2019   Mesenteric ischemia ([place])    Colonic ulcer    Right lower quadrant pain    Abnormal CT of the abdomen    Ischemic colitis ([place])    Colitis presumed  infectious 03/09/2019   Acute respiratory failure with hypoxia and hypercapnia ([place]) 03/04/2019   Pruritus, unspecified 02/21/2019   Acute pulmonary edema ([place]) 02/11/2019   Pain, unspecified 01/21/2019   Atrial fibrillation with RVR ([place]) 12/05/2018   Paroxysmal atrial fibrillation ([place]) 11/16/2018   LBBB (left bundle branch block) 11/16/2018   Pulmonary edema 10/21/2018   Gout 10/21/2018   Hypertensive emergency 10/21/2018   Chronic combined systolic and diastolic heart failure, NYHA class 3 ([place]) 10/21/2018   Leukocytosis 10/21/2018   Gastroesophageal reflux disease 08/28/2018   CAD (coronary artery disease) 04/03/2018   Palliative care encounter 03/27/2018   Atrial fibrillation, chronic ([place]) 03/25/2018   Anemia in chronic kidney disease 03/12/2018   Coagulation defect, unspecified ([place]) 03/12/2018   Secondary hyperparathyroidism of renal origin ([place]) 03/12/2018   Palliative care by specialist    Advance care planning    Goals of care, counseling/discussion    Respiratory arrest ([place])    Acute on chronic systolic CHF (congestive heart failure), NYHA class 4 ([place])    Chronic obstructive pulmonary disease ([place])    HIV (human immunodeficiency virus infection) ([place])    Pneumonia    Aspiration pneumonia of both lower lobes due to regurgitated food ([place])    Sepsis due to pneumonia ([place]) 11/22/2017   AIDS ([place]) 05/14/2014   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   Gastroparesis 05/06/2014   Protein-calorie malnutrition, severe ([place]) 05/05/2014   ESRD (end stage renal disease) on dialysis ([place]) 05/02/2014   Hemodialysis-associated hypotension 05/02/2014   Leaking of conjunctival drainage bleb 05/01/2014   POAG (primary  open-angle glaucoma) 05/01/2014   Loss of weight 04/29/2014   Conjunctivitis 04/29/2014   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   Low back pain 04/09/2012   Osteoarthritis of hip 12/29/2011   Iron deficiency anemia 06/28/2011   Macrocytic anemia 04/02/2011    ADENOCARCINOMA, PROSTATE, 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   Hyperlipidemia 11/30/2010   Transient cerebral ischemia 11/30/2010   HYPERGLYCEMIA 11/30/2010    Orientation RESPIRATION BLADDER Height & Weight     Self, Time, Situation, Place  Normal Incontinent, External Catheter placed  Weight: 176 lb 12.9 oz (80.2 kg) Height:  6\' 2"  (188 cm)  BEHAVIORAL SYMPTOMS/MOOD NEUROLOGICAL BOWEL NUTRITION STATUS      Continent Diet (See DC summary)  AMBULATORY STATUS COMMUNICATION OF NEEDS Skin   Extensive Assist Verbally Normal, Skin abrasions                       Personal Care Assistance Level of Assistance  Bathing, Feeding, Dressing Bathing Assistance: Maximum assistance Feeding assistance: Limited assistance Dressing Assistance: Maximum assistance     Functional Limitations Info  Sight, Speech, Hearing Sight Info: Impaired Hearing Info: Adequate Speech Info: Adequate    SPECIAL CARE FACTORS FREQUENCY  PT (By licensed PT), OT (By licensed OT)     PT Frequency: 5x a week OT Frequency: 5x a week            Contractures Contractures Info: Not present    Additional Factors Info  Code Status, Allergies Code Status Info: Full Allergies Info: Dextromethorphan-guaifenesin   Tocotrienols   Losartan Potassium           Current Medications (06/24/2021):  This is the current hospital active medication list Current Facility-Administered Medications  Medication Dose Route Frequency Provider Last Rate Last Admin   (feeding supplement) PROSource Plus liquid 30 mL  30 mL Oral TID BM Alma Friendly, MD   30 mL at 06/24/21 1356   acetaminophen (TYLENOL) suppository 650 mg  650 mg Rectal Q6H PRN Chotiner, Yevonne Aline, MD       acetaminophen (TYLENOL) tablet 650 mg  650 mg Oral Q6H PRN Irene Pap N, DO       allopurinol (ZYLOPRIM) tablet 100 mg  100 mg Oral Once per day on Mon Tue Thu Sat Kayleen Memos, DO   100 mg at 06/23/21 5027   amiodarone (PACERONE) tablet 200 mg  200 mg  Oral Daily Geralynn Rile, MD   200 mg at 06/24/21 0909   Ampicillin-Sulbactam (UNASYN) 3 g in sodium chloride 0.9 % 100 mL IVPB  3 g Intravenous Q12H Alma Friendly, MD       apixaban Arne [place]) tablet 5 mg  5 mg Oral BID Alma Friendly, MD       bictegravir-emtricitabine-tenofovir AF ([name]) 50-200-25 MG per tablet 1 tablet  1 tablet Oral Daily Irene Pap N, DO   1 tablet at 06/24/21 0910   brimonidine (ALPHAGAN) 0.15 % ophthalmic solution 1 drop  1 drop Right Eye BID Irene Pap N, DO   1 drop at 06/24/21 0911   Chlorhexidine Gluconate Cloth 2 % PADS 6 each  6 each Topical Q0600 Loren Racer, PA-C   6 each at 06/23/21 0800   midodrine (PROAMATINE) tablet 10 mg  10 mg Oral 2 times per day on Tue Thu Sat Kayleen Memos, DO   10 mg at 06/23/21 1221   [START ON 06/26/2021] midodrine (PROAMATINE) tablet 10 mg  10 mg Oral Once per day  on Sun Mon Wed Fri Donato Heinz, MD       montelukast (SINGULAIR) tablet 10 mg  10 mg Oral QHS Kayleen Memos, [place]   10 mg at 06/23/21 2212   multivitamin (RENA-VIT) tablet 1 tablet  1 tablet Oral QPM Irene Pap N, DO   1 tablet at 06/22/21 1900   ofloxacin (OCUFLOX) 0.3 % ophthalmic solution 1 drop  1 drop Right Eye QID Irene Pap N, DO   1 drop at 06/24/21 1446   oxyCODONE (Oxy IR/ROXICODONE) immediate release tablet 5 mg  5 mg Oral Q6H PRN Irene Pap N, DO   5 mg at 06/23/21 1646   pantoprazole (PROTONIX) injection 40 mg  40 mg Intravenous Daily Alma Friendly, MD   40 mg at 06/24/21 0936   polyethylene glycol (MIRALAX / GLYCOLAX) packet 17 g  17 g Oral Daily PRN Irene Pap N, DO       prochlorperazine (COMPAZINE) injection 5 mg  5 mg Intravenous Q6H PRN Kayleen Memos, DO       sucroferric oxyhydroxide (VELPHORO) chewable tablet 500 mg  500 mg Oral TID with meals Irene Pap N, DO   500 mg at 06/24/21 1244     Discharge Medications: Please see discharge summary for a list of discharge medications.  Relevant Imaging  Results:  Relevant Lab Results:   Additional Information SSN: [id number]; Moderna COVID-19 Vaccine 10/19/2020 , 04/15/2020 , 03/18/2020  Reece Agar, LCSWA

## 2021-06-24 NOTE — Progress Notes (Signed)
Progress Note  Patient Name: [name] Date of Encounter: 06/24/2021  [facility] Cardiologist: [name], MD   Subjective   Denies any CP or significant dyspnea  Inpatient Medications    Scheduled Meds:  (feeding supplement) PROSource Plus  30 mL Oral TID BM   allopurinol  100 mg Oral Once per day on Mon Tue Thu Sat   amiodarone  200 mg Oral Daily   bictegravir-emtricitabine-tenofovir AF  1 tablet Oral Daily   brimonidine  1 drop Right Eye BID   Chlorhexidine Gluconate Cloth  6 each Topical Q0600   midodrine  10 mg Oral 2 times per day on Tue Thu Sat   midodrine  5 mg Oral Once per day on Sun Mon Wed Fri   montelukast  10 mg Oral QHS   multivitamin  1 tablet Oral QPM   ofloxacin  1 drop Right Eye QID   pantoprazole (PROTONIX) IV  40 mg Intravenous Daily   sodium zirconium cyclosilicate  10 g Oral Daily   sucroferric oxyhydroxide  500 mg Oral TID with meals   Continuous Infusions:  cefTRIAXone (ROCEPHIN)  IV 2 g (06/23/21 2228)   heparin 2,000 Units/hr (06/23/21 2218)   PRN Meds: acetaminophen, acetaminophen, oxyCODONE, polyethylene glycol, prochlorperazine   Vital Signs    Vitals:   06/23/21 2030 06/23/21 2040 06/23/21 2151 06/24/21 0404  BP: (!) 85/64 95/62 103/72 107/65  Pulse: 95 92 97 87  Resp: (!) 25 (!) 22 20 17   Temp: 97.8 F (36.6 C) 97.8 F (36.6 C) 98 F (36.7 C) 98 F (36.7 C)  TempSrc: Oral Oral Oral Oral  SpO2:   100% 100%  Weight:  81.4 kg  80.2 kg  Height:        Intake/Output Summary (Last 24 hours) at 06/24/2021 0739 Last data filed at 06/24/2021 0600 Gross per 24 hour  Intake 1627.55 ml  Output -322 ml  Net 1949.55 ml   Last 3 Weights 06/24/2021 06/23/2021 06/23/2021  Weight (lbs) 176 lb 12.9 oz 179 lb 7.3 oz 176 lb 5.9 oz  Weight (kg) 80.2 kg 81.4 kg 80 kg      Telemetry    Atrial fibrillation with HR 90s - Personally Reviewed  ECG    Atrial fibrillation with RVR, LBBB - Personally Reviewed  Physical Exam    GEN: No acute distress.   Neck: No JVD Cardiac: irregularly irregular, no murmurs, rubs, or gallops.  Respiratory: Clear to auscultation bilaterally. GI: Soft, nontender, non-distended  MS: No edema; No deformity. Neuro:  Nonfocal  Psych: Normal affect   Labs    High Sensitivity Troponin:   Recent Labs  Lab 07/01/2021 1700 06/21/21 1742 06/21/21 1843  TROPONINIHS 106* 127* 128*      Chemistry Recent Labs  Lab 06/21/21 0814 06/21/21 1136 06/21/21 1742 06/22/21 0235 06/23/21 0349 06/24/21 0310  NA 137   < > 138 138 139 137  K 5.7*   < > 5.1 5.3* 5.2* 4.1  CL 94*  --  94* 93* 94* 94*  CO2 28  --  26 27 22 26   GLUCOSE 99  --  94 126* 120* 123*  BUN 75*  --  44* 44* 74* 65*  CREATININE 10.68*  --  7.23* 7.51* 9.33* 7.75*  CALCIUM 8.9  --  9.5 9.6 9.5 9.1  PROT 6.4*  --  8.2* 7.6  --   --   ALBUMIN 2.9*  --  3.5 3.1* 2.7* 2.6*  AST 24  --  42* 51*  --   --   ALT 6  --  12 13  --   --   ALKPHOS 105  --  126 122  --   --   BILITOT 1.1  --  1.7* 1.0  --   --   GFRNONAA 4*  --  7* 7* 5* 7*  ANIONGAP 15  --  18* 18* 23* 17*   < > = values in this interval not displayed.     Hematology Recent Labs  Lab 06/22/21 0235 06/23/21 0349 06/24/21 0310  WBC 16.6* 13.1* 9.7  RBC 3.46* 3.12* 2.78*  HGB 12.2* 11.1* 9.6*  HCT 36.5* 32.1* 28.4*  MCV 105.5* 102.9* 102.2*  MCH 35.3* 35.6* 34.5*  MCHC 33.4 34.6 33.8  RDW 15.1 15.0 15.0  PLT 272 276 276    BNP Recent Labs  Lab 06/22/2021 1700  BNP 4,128.9*     DDimer No results for input(s): DDIMER in the last 168 hours.   Radiology    EEG adult  Result Date: 06/22/2021 Lora Havens, MD     06/22/2021  3:34 PM Patient Name: David [name] MRN: [id number] Epilepsy Attending: Lora Havens Referring Physician/Provider: Dr Carlisle Cater Date: 06/22/2021 Duration: 24.32 mins Patient history: 80 year old male with altered mental status.  EEG to evaluate for seizures. Level of alertness: Awake, asleep AEDs  during EEG study: None Technical aspects: This EEG study was done with scalp electrodes positioned according to the 10-20 International system of electrode placement. Electrical activity was acquired at a sampling rate of 500Hz  and reviewed with a high frequency filter of 70Hz  and a low frequency filter of 1Hz . EEG data were recorded continuously and digitally stored. Description: No clear posterior dominant rhythm was seen. Sleep was characterized by vertex waves, sleep spindles (12 to 14 Hz), maximal frontocentral region.  EEG also showed continuous 6-8 Hz theta-alpha activity as well as intermittent generalized 2 to 3 Hz delta slowing.  Hyperventilation and photic stimulation were not performed.   ABNORMALITY - Continuous slow, generalized IMPRESSION: This study is suggestive of moderate diffuse encephalopathy, nonspecific etiology. No seizures or epileptiform discharges were seen throughout the recording. Lora Havens    Cardiac Studies   Echo 06/21/2021 1. Left ventricular ejection fraction, by estimation, is 20 to 25%. The  left ventricle has severely decreased function. The left ventricle  demonstrates global hypokinesis. The left ventricular internal cavity size  was severely dilated. Left ventricular  diastolic parameters are consistent with Grade II diastolic dysfunction  (pseudonormalization). Elevated left atrial pressure.   2. Right ventricular systolic function is mildly reduced. The right  ventricular size is severely enlarged. There is normal pulmonary artery  systolic pressure. The estimated right ventricular systolic pressure is  59.5 mmHg.   3. Left atrial size was moderately dilated.   4. Right atrial size was mildly dilated.   5. The mitral valve is normal in structure. Severe mitral valve  regurgitation.   6. The aortic valve is tricuspid. Aortic valve regurgitation is mild to  moderate. Mild to moderate aortic valve stenosis.   7. There is mild dilatation of the  ascending aorta, measuring 42 mm.   8. The inferior vena cava is dilated in size with >50% respiratory  variability, suggesting right atrial pressure of 8 mmHg.   Comparison(s): Prior images reviewed side by side. The left ventricular  function is unchanged. The right ventricular systolic function is worse.  Mitral regurgitation appears more severe. There is  systolic flow reversal  in the pulmonary veins. In a  hemodialysis patient, this may be due to different loading conditions.   Patient Profile     80 y.o. male with PMH of ESRD on HD, chronic systolic CHF (EV03-50%), NICM, nonobstructive CAD, HIV, LBBB, PAF and prior PEA arrest who was admitted on 06/06/2021 with generalized weakness and nausea. Found to be in recurrent afib.   Assessment & Plan    Generalized weakness/encephalopathy  - reassuring head CT and MRI. EEG showed moderate diffuse encephalopathy  - possible UTI  Chronic systolic heart failure/ NICM  - evidence of biventricular failure noted on echo. EF 20-25%  - volume managed by dialysis  Moderate to severe MR  Persistent atrial fibrillation   - intolerant of beta blocker. On amiodarone for rate control. No digoxin given ESRD.   - likely can be transitioned back to Eliquis from IV heparin at this point if no procedure is planned.   - does not appears to have cardiac awareness of afib. HR borderline controlled in 90s on 200mg  amiodarone. Moderate left atrial enlargement and biventricular failure make maintenance of sinus rhythm difficult, therefore would focus more on rate control. Surprisingly he was able to maintain sinus rhythm as outpatient for about a year prior to this hospitalization. Last afib prior to this admission was in Feb 2021.   CAD: nonobstructive by cath 2019  Hypotension: likely related to dialysis, unable to add heart failure therapy. On Midodrine       For questions or updates, please contact [place] Please consult [email or web address] for  contact info under        Signed, Almyra Deforest, [facility]  06/24/2021, 7:39 AM

## 2021-06-24 NOTE — Progress Notes (Signed)
PROGRESS NOTE    David [name]  PPJ:[id number] DOB: November 19, 1941 DOA: 06/24/2021 PCP: [name], NP   Brief Narrative:  80 year old chronically ill-appearing male with a past medical history significant for but not limited to ESRD on hemodialysis M/T/T/S, Afib on [facility], history of gout, chronic hypotension, GERD, recent admission for right hip fracture status post repair presented from his SNF for generalized weakness and nausea.  Work-up in the ED revealed electrolyte disturbances, elevated BNP greater than 4000, mild pulmonary edema with concern for acute CHF and elevated troponin.  TRH was asked to admit this patient but patient was unable to provide a subjective history due to his lethargy.  He underwent head imaging with a head CT scan as well as a MRI.  Head CT done showed no acute intracranial findings but did show chronic microvascular ischemic changes and cerebral volume loss.  MRI done showed no acute intracranial abnormality and showed grade generalized volume loss and findings of chronic microvascular ischemia. On 7/19, after HD, was found to be in A. fib with RVR, cardiology consulted.  Patient admitted for further management.    Assessment & Plan:   Active Problems:   ESRD (end stage renal disease) on dialysis (HCC)   Acute on chronic systolic CHF (congestive heart failure), NYHA class 4 (HCC)   Atrial fibrillation with RVR (HCC)   Generalized weakness   A-fib (HCC)   Possible acute metabolic encephalopathy  Possible UTI Noted 100.8 fever on 06/21/2021, currently afebrile with leukocytosis UA with large leukocytes, greater than 50 WBC UC growing >100,000 Acinetobacter calcoaceticus/baumannii complex, start Unasyn, stop ceftriaxone BC x2 NGTD Chest x-ray unremarkable for any infection Procalcitonin elevated at 29 Head CT and MRI brain done showed no acute intracranial findings but did show chronic microvascular ischemic changes and cerebral volume loss.   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 fib on Eliquis Currently in A. 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 Hemoglobin is stable No overt bleeding Iron supplementation by nephrology Daily CBC     DVT prophylaxis: Eliquis Code Status: FULL CODE  Family Communication: Discussed with Wife at bedside on 06/22/2021 Disposition Plan: Pending further clinical improvement   Status is: Inpatient  The patient will require care spanning > 2 midnights and should be moved to inpatient because: Unsafe d/c plan, IV treatments appropriate due to intensity of illness or inability to take PO, and Inpatient level of care appropriate due to severity of illness  Dispo:  Patient From: [place]  Planned Disposition: [place]  Medically stable for discharge: No  Consultants:  Cardiology Nephrology   Procedures: ECHOCARDIOGRAM IMPRESSIONS     1. Left ventricular ejection fraction, by estimation,  is 20 to 25%. The  left ventricle has severely decreased function. The left ventricle  demonstrates global hypokinesis. The left ventricular internal cavity size  was severely dilated. Left ventricular  diastolic parameters are consistent with Grade II diastolic dysfunction  (pseudonormalization). Elevated left atrial pressure.   2. Right ventricular systolic function is mildly reduced. The right  ventricular size is severely enlarged. There is normal pulmonary artery  systolic pressure. The estimated right ventricular systolic pressure is  26.9 mmHg.   3. Left atrial size was moderately dilated.   4. Right atrial size was mildly dilated.   5. The mitral valve is normal in structure. Severe mitral valve  regurgitation.   6. The aortic valve is tricuspid. Aortic valve regurgitation is mild to  moderate. Mild to moderate aortic valve stenosis.   7. There is mild dilatation of the ascending aorta, measuring 42 mm.   8. The inferior vena cava is dilated in size with >50% respiratory  variability, suggesting right atrial pressure of 8 mmHg.   Comparison(s): Prior images reviewed side by side. The left ventricular  function is unchanged. The right ventricular systolic function is worse.  Mitral regurgitation appears more severe. There is systolic flow reversal  in the pulmonary veins. In a  hemodialysis patient, this may be due to different loading conditions.   FINDINGS   Left Ventricle: Left ventricular ejection fraction, by estimation, is 20  to 25%. The left ventricle has severely decreased function. The left  ventricle demonstrates global hypokinesis. The left ventricular internal  cavity size was severely dilated.  There is no left ventricular hypertrophy. Abnormal (paradoxical) septal  motion, consistent with left bundle branch block. Left ventricular  diastolic parameters are consistent with Grade II diastolic dysfunction  (pseudonormalization). Elevated left atrial   pressure.    Right Ventricle: The right ventricular size is severely enlarged. No  increase in right ventricular wall thickness. Right ventricular systolic  function is mildly reduced. There is normal pulmonary artery systolic  pressure. The tricuspid regurgitant  velocity is 2.44 m/s, and with an assumed right atrial pressure of 8 mmHg,  the estimated right ventricular systolic pressure is 48.5 mmHg.   Left Atrium: Left atrial size was moderately dilated.   Right Atrium: Right atrial size was mildly dilated.   Pericardium: Trivial pericardial effusion is present.   Mitral Valve: There is diastolic MR due to long AV delay/first degree AV  block. The mitral valve is normal in structure. Severe mitral valve  regurgitation, with centrally-directed jet.   Tricuspid Valve: The tricuspid valve is normal in structure. Tricuspid  valve regurgitation is trivial.   Aortic Valve: The aortic valve is tricuspid. Aortic valve regurgitation is  mild to moderate. Aortic regurgitation PHT measures 359 msec. Mild to  moderate aortic stenosis is present. Aortic valve mean gradient measures  11.0 mmHg. Aortic valve peak  gradient measures 21.3 mmHg. Aortic valve area, by VTI measures 1.26 cm.   Pulmonic Valve: The pulmonic valve was grossly normal. Pulmonic valve  regurgitation is not visualized.   Aorta: The aortic root is normal in size and structure. There is mild  dilatation of the ascending aorta, measuring 42 mm.   Venous: A pattern of systolic flow reversal, suggestive of severe mitral  regurgitation is recorded from the right upper pulmonary vein. The  inferior vena cava is dilated in size with greater than  50% respiratory  variability, suggesting right atrial  pressure of 8 mmHg.   IAS/Shunts: No atrial level shunt detected by color flow Doppler.      LEFT VENTRICLE  PLAX 2D  LVIDd:         7.07 cm  Diastology  LVIDs:         6.20 cm  LV e' medial:    4.03 cm/s  LV PW:         1.60 cm  LV  E/e' medial:  35.0  LV IVS:        1.00 cm  LV e' lateral:   6.42 cm/s  LVOT diam:     1.90 cm  LV E/e' lateral: 22.0  LV SV:         47  LV SV Index:   23  LVOT Area:     2.84 cm      RIGHT VENTRICLE             IVC  RV Basal diam:  3.80 cm     IVC diam: 2.10 cm  RV Mid diam:    2.80 cm  RV S prime:     11.60 cm/s  TAPSE (M-mode): 2.0 cm   LEFT ATRIUM             Index       RIGHT ATRIUM           Index  LA diam:        4.30 cm 2.08 cm/m  RA Area:     18.40 cm  LA Vol (A2C):   79.3 ml 38.37 ml/m RA Volume:   51.50 ml  24.92 ml/m  LA Vol (A4C):   68.8 ml 33.29 ml/m  LA Biplane Vol: 74.4 ml 36.00 ml/m   AORTIC VALVE  AV Area (Vmax):    1.29 cm  AV Area (Vmean):   1.25 cm  AV Area (VTI):     1.26 cm  AV Vmax:           231.00 cm/s  AV Vmean:          157.500 cm/s  AV VTI:            0.375 m  AV Peak Grad:      21.3 mmHg  AV Mean Grad:      11.0 mmHg  LVOT Vmax:         105.00 cm/s  LVOT Vmean:        69.200 cm/s  LVOT VTI:          0.167 m  LVOT/AV VTI ratio: 0.45  AI PHT:            359 msec     AORTA  Ao Root diam: 3.00 cm  Ao Asc diam:  4.20 cm   MITRAL VALVE                 TRICUSPID VALVE  MV Area (PHT): 3.60 cm      TR Peak grad:   23.8 mmHg  MV Decel Time: 211 msec      TR Vmax:        244.00 cm/s  MR Peak grad:    95.8 mmHg  MR Mean grad:    59.5 mmHg   SHUNTS  MR Vmax:         489.50 cm/s Systemic VTI:  0.17 m  MR Vmean:        364.0 cm/s  Systemic Diam: 1.90 cm  MR PISA:         1.01 cm  MR PISA Eff ROA: 8 mm  MR PISA Radius:  0.40 cm  MV E velocity: 141.00 cm/s  MV A velocity: 110.00 cm/s  MV E/A ratio:  1.28   Antimicrobials:  Anti-infectives (From admission, onward)    Start     Dose/Rate Route Frequency Ordered Stop   06/21/21 2315  cefTRIAXone (ROCEPHIN) 2 g in sodium chloride 0.9 % 100 mL IVPB        2 g 200 mL/hr over 30 Minutes Intravenous Every 24 hours 06/21/21 2227     06/21/21 1000  bictegravir-emtricitabine-tenofovir AF  ([name]) 50-200-25 MG per tablet 1 tablet        1 tablet Oral Daily 06/19/2021 2213          Subjective: Patient mentation continues to improve slowly, still very deconditioned with generalized weakness overall.  Denies any new complaints   Objective: Vitals:   06/23/21 2151 06/24/21 0404 06/24/21 0900 06/24/21 1107  BP: 103/72 107/65 120/72 102/69  Pulse: 97 87  (!) 112  Resp: 20 17  20   Temp: 98 F (36.7 C) 98 F (36.7 C)  (!) 97.4 F (36.3 C)  TempSrc: Oral Oral  Oral  SpO2: 100% 100%    Weight:  80.2 kg    Height:        Intake/Output Summary (Last 24 hours) at 06/24/2021 1605 Last data filed at 06/24/2021 0901 Gross per 24 hour  Intake 1192.55 ml  Output -323 ml  Net 1515.55 ml   Filed Weights   06/23/21 1858 06/23/21 2040 06/24/21 0404  Weight: 80 kg 81.4 kg 80.2 kg   Examination: Physical Exam: General: NAD, very deconditioned, chronically ill-appearing Cardiovascular: S1, S2 present Respiratory: Diminished breath sounds bilaterally Abdomen: Soft, nontender, nondistended, bowel sounds present Musculoskeletal: No bilateral pedal edema noted Skin: Normal Psychiatry: Normal mood     Data Reviewed: I have personally reviewed following labs and imaging studies  CBC: Recent Labs  Lab 06/29/2021 1700 06/17/2021 2105 06/21/21 0814 06/21/21 1136 06/21/21 1843 06/22/21 0235 06/23/21 0349 06/24/21 0310  WBC 9.3   < > 10.3  --  17.4* 16.6* 13.1* 9.7  NEUTROABS 7.4  --  7.9*  --  15.2* 13.4*  --   --   HGB 10.7*   < > 9.8* 10.5* 12.1* 12.2* 11.1* 9.6*  HCT 32.9*   < > 29.6* 31.0* 37.8* 36.5* 32.1* 28.4*  MCV 108.2*   < > 108.4*  --  110.5* 105.5* 102.9* 102.2*  PLT 281   < > 260  --  214 272 276 276   < > = values in this interval not displayed.   Basic Metabolic Panel: Recent Labs  Lab 06/09/2021 1700 06/07/2021 2105 06/21/21 0814 06/21/21 1136 06/21/21 1742 06/22/21 0235 06/23/21 0349 06/24/21 0310  NA 137  --  137 137 138 138 139 137  K 5.4*  --   5.7* 5.4* 5.1 5.3* 5.2* 4.1  CL 93*  --  94*  --  94* 93* 94* 94*  CO2 27  --  28  --  26 27 22 26   GLUCOSE 114*  --  99  --  94 126* 120* 123*  BUN 56*  --  75*  --  44* 44* 74* 65*  CREATININE 9.26*   < > 10.68*  --  7.23* 7.51* 9.33* 7.75*  CALCIUM 8.9  --  8.9  --  9.5 9.6 9.5 9.1  MG 2.6*  --  2.7*  --  2.6* 2.6*  --   --   PHOS 8.5*  --  10.3*  --  7.7* 8.2* 10.4* 7.7*   < > = values in this interval not displayed.   GFR: Estimated Creatinine Clearance: 8.8 mL/min (A) (by C-G formula based on SCr of 7.75 mg/dL (H)). Liver Function Tests: Recent Labs  Lab 06/22/2021 1700 06/21/21 0814 06/21/21 1742 06/22/21 0235 06/23/21 0349 06/24/21 0310  AST 29 24 42* 51*  --   --   ALT 7 6 12 13   --   --   ALKPHOS 123 105 126 122  --   --   BILITOT 1.1 1.1 1.7* 1.0  --   --   PROT 7.0 6.4* 8.2* 7.6  --   --   ALBUMIN 3.2* 2.9* 3.5 3.1* 2.7* 2.6*   No results for input(s): LIPASE, AMYLASE in the last 168 hours. Recent Labs  Lab 06/06/2021 1700  AMMONIA 31   Coagulation Profile: Recent Labs  Lab 06/12/2021 1700  INR 1.8*   Cardiac Enzymes: No results for input(s): CKTOTAL, CKMB, CKMBINDEX, TROPONINI in the last 168 hours. BNP (last 3 results) No results for input(s): PROBNP in the last 8760 hours. HbA1C: No results for input(s): HGBA1C in the last 72 hours. CBG: Recent Labs  Lab 06/12/2021 1529 07/03/2021 2212 06/21/21 0831  GLUCAP 116* 111* 110*   Lipid Profile: No results for input(s): CHOL, HDL, LDLCALC, TRIG, CHOLHDL, LDLDIRECT in the last 72 hours. Thyroid Function Tests: Recent Labs    06/22/21 0134  TSH 1.094   Anemia Panel: Recent Labs    06/22/21 0134  VITAMINB12 1,016*   Sepsis Labs: Recent Labs  Lab 07/01/2021 1700 07/01/2021 2105 06/21/21 2318 06/22/21 0235 06/23/21 0349 06/24/21 0310  PROCALCITON  --   --   --   --  29.91 26.07  LATICACIDVEN 1.8 1.6 1.8 1.7  --   --     Recent Results (from the past 240 hour(s))  Resp Panel by RT-PCR (Flu A&B,  Covid) Nasopharyngeal Swab     Status: None   Collection Time: 06/16/21  5:04 PM   Specimen: Nasopharyngeal Swab; Nasopharyngeal(NP) swabs in vial transport medium  Result Value Ref Range Status   SARS Coronavirus 2 by RT PCR NEGATIVE NEGATIVE Final    Comment: (NOTE) SARS-CoV-2 target nucleic acids are NOT DETECTED.  The SARS-CoV-2 RNA is generally detectable in upper respiratory specimens during the acute phase of infection. The lowest concentration of SARS-CoV-2 viral copies this assay can detect is 138 copies/mL. A negative result does not preclude SARS-Cov-2 infection and should not be used as the sole basis for treatment or other patient management decisions. A negative result may occur with  improper specimen collection/handling, submission of specimen other than nasopharyngeal swab, presence of viral mutation(s) within the areas targeted by this assay, and inadequate number of viral copies(<138 copies/mL). A negative result must be combined with clinical observations, patient history, and epidemiological information. The expected result is Negative.  Fact Sheet for Patients:  EntrepreneurPulse.com.au  Fact Sheet for Healthcare Providers:  IncredibleEmployment.be  This test is no t yet approved or cleared by the [place] FDA and  has been authorized for detection and/or diagnosis of SARS-CoV-2 by FDA under an Emergency Use Authorization (EUA). This EUA will remain  in effect (meaning this test can be used) for the duration of the COVID-19 declaration under Section 564(b)(1) of the Act, 21 U.S.C.section 360bbb-3(b)(1), unless the authorization is terminated  or revoked  sooner.       Influenza A by PCR NEGATIVE NEGATIVE Final   Influenza B by PCR NEGATIVE NEGATIVE Final    Comment: (NOTE) The Xpert Xpress SARS-CoV-2/FLU/RSV plus assay is intended as an aid in the diagnosis of influenza from Nasopharyngeal swab specimens and should  not be used as a sole basis for treatment. Nasal washings and aspirates are unacceptable for Xpert Xpress SARS-CoV-2/FLU/RSV testing.  Fact Sheet for Patients: EntrepreneurPulse.com.au  Fact Sheet for Healthcare Providers: IncredibleEmployment.be  This test is not yet approved or cleared by the [place] FDA and has been authorized for detection and/or diagnosis of SARS-CoV-2 by FDA under an Emergency Use Authorization (EUA). This EUA will remain in effect (meaning this test can be used) for the duration of the COVID-19 declaration under Section 564(b)(1) of the Act, 21 U.S.C. section 360bbb-3(b)(1), unless the authorization is terminated or revoked.  Performed at [facility], [place] [address], [place], [place] [address]   Blood culture (routine x 2)     Status: None (Preliminary result)   Collection Time: 06/10/2021  3:53 PM   Specimen: BLOOD  Result Value Ref Range Status   Specimen Description BLOOD SITE NOT SPECIFIED  Final   Special Requests   Final    BOTTLES DRAWN AEROBIC AND ANAEROBIC Blood Culture results may not be optimal due to an excessive volume of blood received in culture bottles IV START   Culture   Final    NO GROWTH 4 DAYS Performed at [facility], [place] [address] [place], [place] [address]    Report Status PENDING  Incomplete  SARS CORONAVIRUS 2 (TAT 6-24 HRS) Nasopharyngeal Nasopharyngeal Swab     Status: None   Collection Time: 06/12/2021  4:50 PM   Specimen: Nasopharyngeal Swab  Result Value Ref Range Status   SARS Coronavirus 2 NEGATIVE NEGATIVE Final    Comment: (NOTE) SARS-CoV-2 target nucleic acids are NOT DETECTED.  The SARS-CoV-2 RNA is generally detectable in upper and lower respiratory specimens during the acute phase of infection. Negative results do not preclude SARS-CoV-2 infection, do not rule out co-infections with other pathogens, and should not be used as the sole basis for treatment or  other patient management decisions. Negative results must be combined with clinical observations, patient history, and epidemiological information. The expected result is Negative.  Fact Sheet for Patients: SugarRoll.be  Fact Sheet for Healthcare Providers: https://www.woods-mathews.com/  This test is not yet approved or cleared by the [place] FDA and  has been authorized for detection and/or diagnosis of SARS-CoV-2 by FDA under an Emergency Use Authorization (EUA). This EUA will remain  in effect (meaning this test can be used) for the duration of the COVID-19 declaration under Se ction 564(b)(1) of the Act, 21 U.S.C. section 360bbb-3(b)(1), unless the authorization is terminated or revoked sooner.  Performed at [facility], [place] [address] [place], [place] [address]   Blood culture (routine x 2)     Status: None (Preliminary result)   Collection Time: 06/21/2021  9:08 PM   Specimen: BLOOD  Result Value Ref Range Status   Specimen Description BLOOD LEFT HAND  Final   Special Requests   Final    BOTTLES DRAWN AEROBIC AND ANAEROBIC Blood Culture adequate volume   Culture   Final    NO GROWTH 4 DAYS Performed at [facility], [place] [address], [place], [place] [address]    Report Status PENDING  Incomplete  Culture, blood (routine  x 2)     Status: None (Preliminary result)   Collection Time: 06/21/21 11:07 PM   Specimen: BLOOD LEFT HAND  Result Value Ref Range Status   Specimen Description BLOOD LEFT HAND  Final   Special Requests   Final    BOTTLES DRAWN AEROBIC ONLY Blood Culture results may not be optimal due to an inadequate volume of blood received in culture bottles   Culture   Final    NO GROWTH 2 DAYS Performed at [facility], [place] [address], [place], [place] [address]    Report Status PENDING  Incomplete  Culture, blood (routine x 2)     Status: None (Preliminary result)   Collection Time: 06/21/21  11:14 PM   Specimen: BLOOD  Result Value Ref Range Status   Specimen Description BLOOD LEFT THUMB  Final   Special Requests   Final    BOTTLES DRAWN AEROBIC ONLY Blood Culture results may not be optimal due to an inadequate volume of blood received in culture bottles   Culture   Final    NO GROWTH 2 DAYS Performed at [facility], [place] [address] [name], [place] [address]    Report Status PENDING  Incomplete  Urine Culture     Status: Abnormal   Collection Time: 06/22/21 10:02 AM   Specimen: Urine, Clean Catch  Result Value Ref Range Status   Specimen Description URINE, CLEAN CATCH  Final   Special Requests   Final    NONE Performed at [facility], [place] [address] [place], [place] [address]    Culture (A)  Final    >=100,000 COLONIES/mL ACINETOBACTER CALCOACETICUS/BAUMANNII COMPLEX   Report Status 06/24/2021 FINAL  Final   Organism ID, Bacteria ACINETOBACTER CALCOACETICUS/BAUMANNII COMPLEX (A)  Final      Susceptibility   Acinetobacter calcoaceticus/baumannii complex - MIC*    CEFTAZIDIME 8 SENSITIVE Sensitive     CIPROFLOXACIN 0.5 SENSITIVE Sensitive     GENTAMICIN <=1 SENSITIVE Sensitive     IMIPENEM <=0.25 SENSITIVE Sensitive     PIP/TAZO <=4 SENSITIVE Sensitive     TRIMETH/SULFA <=20 SENSITIVE Sensitive     AMPICILLIN/SULBACTAM <=2 SENSITIVE Sensitive     * >=100,000 COLONIES/mL ACINETOBACTER CALCOACETICUS/BAUMANNII COMPLEX    RN Pressure Injury Documentation:     Estimated body mass index is 22.7 kg/m as calculated from the following:   Height as of this encounter: 6\' 2"  (1.88 m).   Weight as of this encounter: 80.2 kg.  Malnutrition Type: Nutrition Problem: Increased nutrient needs Etiology: chronic illness (ESRD on HD) Malnutrition Characteristics: Signs/Symptoms: estimated needs Nutrition Interventions: Interventions: MVI, Prostat, Magic cup  Radiology Studies: No results found.  Scheduled Meds:  (feeding supplement) PROSource Plus  30 mL  Oral TID BM   allopurinol  100 mg Oral Once per day on Mon Tue Thu Sat   amiodarone  200 mg Oral Daily   bictegravir-emtricitabine-tenofovir AF  1 tablet Oral Daily   brimonidine  1 drop Right Eye BID   Chlorhexidine Gluconate Cloth  6 each Topical Q0600   midodrine  10 mg Oral 2 times per day on Tue Thu Sat   [START ON 06/26/2021] midodrine  10 mg Oral Once per day on Sun Mon Wed Fri   montelukast  10 mg Oral QHS   multivitamin  1 tablet Oral QPM   ofloxacin  1 drop Right Eye QID   pantoprazole (PROTONIX) IV  40 mg Intravenous Daily   sucroferric oxyhydroxide  500 mg  Oral TID with meals   Continuous Infusions:  cefTRIAXone (ROCEPHIN)  IV 2 g (06/23/21 2228)   heparin 2,000 Units/hr (06/24/21 1137)    LOS: 2 days   Alma Friendly, MD [facility] PAGER is on [place]  If 7PM-7AM, please contact night-coverage [email or web address]

## 2021-06-24 NOTE — TOC Initial Note (Addendum)
Transition of Care [facility]) - Initial/Assessment Note    Patient Details  Name: David [name] MRN: [id number] Date of Birth: 27-Feb-1941  Transition of Care [facility]) CM/SW Contact:    Tresa Endo Phone Number: [phone] 06/24/2021, 5:02 PM  Clinical Narrative:                 CSW received SNF consult. CSW met with pt at bedside. CSW introduced self and explained role at the hospital. Pt reports that PTA the pt came from [facility]. PT reports pt is total assist. PT reports pt will get prevalon boots for bilateral heels and max care, pt has minimal movement.   CSW reviewed PT/OT recommendations for SNF. Pt gave CSW permission to fax out to previous facility.  PT reports they are covid negative and has 3 covid vaccinations.  CSW spoke with [facility] who states pt can DC over the weekend if pt has [place] and a recent covid.   Auth started clinicals faxed  CSW will continue to follow.    Expected Discharge Plan: Skilled Nursing Facility Barriers to Discharge: Continued Medical Work up   Patient Goals and CMS Choice Patient states their goals for this hospitalization and ongoing recovery are:: Rehab CMS Medicare.gov Compare Post Acute Care list provided to:: Patient Choice offered to / list presented to : Patient  Expected Discharge Plan and Services Expected Discharge Plan: [place] In-house Referral: Clinical Social Work   Post Acute Care Choice: [place] Living arrangements for the past 2 months: [place]                                      Prior Living Arrangements/Services Living arrangements for the past 2 months: Single Family Home Lives with:: Spouse Patient language and need for interpreter reviewed:: Yes Do you feel safe going back to the place where you live?: Yes      Need for Family Participation in Patient Care: Yes (Comment) Care giver support system in place?: Yes (comment)   Criminal  Activity/Legal Involvement Pertinent to Current Situation/Hospitalization: No - Comment as needed  Activities of Daily Living      Permission Sought/Granted Permission sought to share information with : Facility Sport and exercise psychologist, Family Supports Permission granted to share information with : Yes, Verbal Permission Granted  Share Information with NAME: Grogan,Joan  Permission granted to share info w AGENCY: SNF  Permission granted to share info w Relationship: Spouse  Permission granted to share info w Contact Information: [phone]  Emotional Assessment Appearance:: Appears stated age Attitude/Demeanor/Rapport: Engaged Affect (typically observed): Appropriate Orientation: : Oriented to Self, Oriented to Place, Oriented to  Time, Oriented to Situation Alcohol / Substance Use: Not Applicable Psych Involvement: No (comment)  Admission diagnosis:  Nausea [R11.0] Weakness [R53.1] Generalized weakness [R53.1] A-fib (HCC) [I48.91] Patient Active Problem List   Diagnosis Date Noted   A-fib ([place]) 06/22/2021   Generalized weakness 06/30/2021   Subcapital fracture of hip, right, closed, initial encounter ([place]) 06/14/2021    Class: Acute   S/P right hip fracture 06/10/2021   Gait disorder 60/09/9322   Complication of vascular dialysis catheter 09/29/2020   Disease of gallbladder, unspecified 05/18/2020   Elevated LFTs    Abdominal pain    Transaminitis 05/15/2020   Full code status 02/03/2020   Subtherapeutic international normalized ratio (INR) 01/24/2020   Pressure injury of skin 01/07/2020   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[place])    COVID-19 12/11/2019   Acute respiratory failure with hypoxemia (HCC) 11/01/2019   Closed fracture of nasal bones 09/10/2019   Hyperkalemia 09/02/2019   Shortness of breath 04/12/2019   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   Dependence on renal dialysis ([place]) 03/27/2019   Hypertensive heart and chronic kidney disease with heart  failure and with stage 5 chronic kidney disease, or end stage renal disease ([place]) 03/27/2019   Mesenteric ischemia (HCC)    Colonic ulcer    Right lower quadrant pain    Abnormal CT of the abdomen    Ischemic colitis ([place])    Colitis presumed infectious 03/09/2019   Acute respiratory failure with hypoxia and hypercapnia (HCC) 03/04/2019   Pruritus, unspecified 02/21/2019   Acute pulmonary edema ([place]) 02/11/2019   Pain, unspecified 01/21/2019   Atrial fibrillation with RVR ([place]) 12/05/2018   Paroxysmal atrial fibrillation ([place]) 11/16/2018   LBBB (left bundle branch block) 11/16/2018   Pulmonary edema 10/21/2018   Gout 10/21/2018   Hypertensive emergency 10/21/2018   Chronic combined systolic and diastolic heart failure, NYHA class 3 ([place]) 10/21/2018   Leukocytosis 10/21/2018   Gastroesophageal reflux disease 08/28/2018   CAD (coronary artery disease) 04/03/2018   Palliative care encounter 03/27/2018   Atrial fibrillation, chronic ([place]) 03/25/2018   Anemia in chronic kidney disease 03/12/2018   Coagulation defect, unspecified ([place]) 03/12/2018   Secondary hyperparathyroidism of renal origin ([place]) 03/12/2018   Palliative care by specialist    Advance care planning    Goals of care, counseling/discussion    Respiratory arrest ([place])    Acute on chronic systolic CHF (congestive heart failure), NYHA class 4 ([place])    Chronic obstructive pulmonary disease ([place])    HIV (human immunodeficiency virus infection) ([place])    Pneumonia    Aspiration pneumonia of both lower lobes due to regurgitated food ([place])    Sepsis due to pneumonia ([place]) 11/22/2017   AIDS ([place]) 05/14/2014   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   Gastroparesis 05/06/2014   Protein-calorie malnutrition, severe ([place]) 05/05/2014   ESRD (end stage renal disease) on dialysis ([place]) 05/02/2014   Hemodialysis-associated hypotension 05/02/2014   Leaking of conjunctival drainage bleb 05/01/2014   POAG (primary open-angle glaucoma) 05/01/2014    Loss of weight 04/29/2014   Conjunctivitis 04/29/2014   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   Low back pain 04/09/2012   Osteoarthritis of hip 12/29/2011   Iron deficiency anemia 06/28/2011   Macrocytic anemia 04/02/2011   ADENOCARCINOMA, PROSTATE, 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   Hyperlipidemia 11/30/2010   Transient cerebral ischemia 11/30/2010   HYPERGLYCEMIA 11/30/2010   PCP:  [name], NP Pharmacy:   [facility], [address] AT [address] [place] [address] Phone: [phone] Fax: [place], [address] AT [address] [facility] [address] Phone: [phone] Fax: [phone]     Social Determinants of Health (SDOH) Interventions    Readmission Risk Interventions Readmission Risk Prevention Plan 12/03/2020 01/09/2020 09/02/2019  Transportation Screening Complete Complete Complete  Medication Review (RN Care Manager) Complete Referral to Pharmacy Complete  PCP or Specialist appointment within 3-5 days of discharge Complete Not Complete -  PCP/Specialist Appt Not Complete comments - discharge to SNF -  [place] or Home Care Consult Complete Not Complete -  [place] or  Home Care Consult Pt Refusal Comments - discharge to SNF -  SW Recovery Care/Counseling Consult Complete Complete -  Palliative Care Screening Complete Complete Not Applicable  Skilled Nursing Facility Complete Complete -  Some recent data might be hidden

## 2021-06-24 NOTE — Progress Notes (Signed)
ANTICOAGULATION CONSULT NOTE  Pharmacy Consult for Heparin Indication: atrial fibrillation  Allergies  Allergen Reactions   Dextromethorphan-Guaifenesin Other (See Comments)    Unknown reaction   Tocotrienols Other (See Comments)    Unknown reaction   Losartan Potassium Other (See Comments)    Causes constipation    Patient Measurements: Height: 6\' 2"  (188 cm) Weight: 80.2 kg (176 lb 12.9 oz) IBW/kg (Calculated) : 82.2 Heparin Dosing Weight: TBW  Vital Signs: Temp: 98 F (36.7 C) (07/22 0404) Temp Source: Oral (07/22 0404) BP: 107/65 (07/22 0404) Pulse Rate: 87 (07/22 0404)  Labs: Recent Labs    06/21/21 1136 06/21/21 1742 06/21/21 1843 06/21/21 1843 06/22/21 0235 06/22/21 0617 06/22/21 1640 06/23/21 0349 06/23/21 1302 06/23/21 2237 06/24/21 0310  HGB  --   --  12.1*  --  12.2*  --   --  11.1*  --   --  9.6*  HCT  --   --  37.8*  --  36.5*  --   --  32.1*  --   --  28.4*  PLT   < >  --  214  --  272  --   --  276  --   --  276  APTT  --   --   --    < > 41* 38*   < > 54* 57* 80* 67*  HEPARINUNFRC  --   --   --   --   --  >1.10*  --  >1.10*  --   --  1.08*  CREATININE  --  7.23*  --   --  7.51*  --   --  9.33*  --   --  7.75*  TROPONINIHS  --  127* 128*  --   --   --   --   --   --   --   --    < > = values in this interval not displayed.     Estimated Creatinine Clearance: 8.8 mL/min (A) (by C-G formula based on SCr of 7.75 mg/dL (H)).   Assessment: 80 year old man with ESRD (TTS HD) admitted on 06/10/2021. Pt has hx of atrial fibrillation, for which he was on apixaban 5 mg PO BID PTA (last dose at 0800 on 7/18, per med rec; pt has not rec'd any doses of apixaban since admission, unable to sit up and swallow pills).   Pharmacy was consulted to dose IV heparin for atrial fibrillation. Given recent apixaban exposure, will monitor anticoagulation using aPTT until aPTT and heparin levels correlate.  aPTT therapeutic x2 on heparin infusion at 2000 units/hr. No  bleeding noted. Will continue at current rate.  Goal of Therapy:  Heparin level 0.3-0.7 units/ml aPTT 66-102 sec Monitor platelets by anticoagulation protocol: Yes   Plan:  Continue heparin infusion to 2000 units/hr. Will follow daily aPTT/heparin level  Ardyth Harps, PharmD Clinical Pharmacist 06/24/2021,8:54 AM

## 2021-06-24 NOTE — Progress Notes (Signed)
Occupational Therapy Treatment Patient Details Name: David [name] MRN: [id number] DOB: 1941-02-09 Today's Date: 06/24/2021    History of present illness Pt is a 80 y.o. male recently admitted after fall s/p R hip pinning 06/12/21 (to be 50% PWB) with d/c to [facility], now admitted from HD on 07/02/2021 with generalized weakness, nausea, ongoing lethargy; unknown etiology, possible UTI. CXR with no acute disease. Brain MRI negative for acute abnormality. EEG on 7/20 suggestive of moderate diffuse encephalopathy; no seizures noted. PMH includes L hip OA, ESRD (HD TTS), HTN, CKD V, afib, stroke, HIV, bradycardia, cardiomyopathy, prostate CA, glaucoma, gout, COVID-19.   OT comments  Pt making incremental progress with OT goals this session. Pt appears to be following more commands and interacting with therapist more throughout the session. Pt is requiring increased time to process simple commands. Pt continues to require total A +2 for LB bathing and rolling in the bed. Pt was able to minimally assist with scooting up in the bed by holding the rails above his head, however he then demonstrated difficulty bringing BUE back down when finished with bed mobility. Pt continues to demonstrate need for SNF level therapies pending progression and activity tolerance. OT will continue to follow acutely.    Follow Up Recommendations  SNF;Supervision/Assistance - 24 hour    Equipment Recommendations  None recommended by OT    Recommendations for Other Services      Precautions / Restrictions Precautions Precautions: Fall;Other (comment) Precaution Comments: Bladder/bowel incontinence, watch BP Restrictions Weight Bearing Restrictions: Yes RLE Weight Bearing: Partial weight bearing RLE Partial Weight Bearing Percentage or Pounds: 50%       Mobility Bed Mobility Overal bed mobility: Needs Assistance Bed Mobility: Rolling Rolling: Total assist;+2 for physical assistance;+2 for  safety/equipment         General bed mobility comments: Rolling R/L for pericare and linen change secondary to bowel incontinence; pt able to minimally assist with BUE support with max verbal/tactile cues for UE positioning on rails and external assist for placement; ultimately requiring totalA+2 for trunk/hip rotation and repositioning; totalA+2 to slide up in bed and placed in partial chair position    Transfers                 General transfer comment: planned for maximove lift to recliner, deferred secondary to bouts of diarrhea incontinence and pt unable to notify staff when soiled    Balance Overall balance assessment: History of Falls                                         ADL either performed or assessed with clinical judgement   ADL Overall ADL's : Needs assistance/impaired             Lower Body Bathing: Total assistance;+2 for physical assistance;+2 for safety/equipment;Cueing for sequencing;Cueing for safety;Bed level                         General ADL Comments: Pt reqired total A +2 for LB bathing, requiring increased cueing for command following and initiation     Vision       Perception     Praxis      Cognition Arousal/Alertness: Awake/alert;Lethargic Behavior During Therapy: Flat affect Overall Cognitive Status: No family/caregiver present to determine baseline cognitive functioning Area of Impairment: Orientation;Attention;Memory;Following commands;Safety/judgement;Awareness;Problem solving  Current Attention Level: Sustained;Focused Memory: Decreased recall of precautions;Decreased short-term memory Following Commands: Follows one step commands inconsistently;Follows one step commands with increased time Safety/Judgement: Decreased awareness of safety;Decreased awareness of deficits Awareness: Intellectual Problem Solving: Slow processing;Decreased initiation;Difficulty  sequencing;Requires verbal cues;Requires tactile cues General Comments: Alert and responsive majority of session, less so towards end of session suspect related to fatigue, starting to keep eyes closed, although pt denies fatigue. Inconsistent command following with increased time; appears to answer majority of yes/no questions appropriately, aware of bowel incontinence but had not called for assistance (pt likely cannot sequence call bell)        Exercises Other Exercises Other Exercises: PROM R hip/knee flex/ext/adb/add - pt able to minimally perform ankle pumps, minimal active hip/knee extension AAROM   Shoulder Instructions       General Comments Pt c/o heel discomfort - bilateral heels elevated off bed surface for pressure relief; spoke with RN who will order prevalon boots. Post-mobility BP 104/82, HR 90s-110s, SpO2 >/96% on RA    Pertinent Vitals/ Pain       Pain Assessment: Faces Faces Pain Scale: Hurts little more Pain Location: RLE with PROM, bilateral heels Pain Descriptors / Indicators: Operative site guarding;Grimacing;Guarding Pain Intervention(s): Limited activity within patient's tolerance;Monitored during session;Repositioned  Home Living                                          Prior Functioning/Environment              Frequency  Min 2X/week        Progress Toward Goals  OT Goals(current goals can now be found in the care plan section)  Progress towards OT goals: Progressing toward goals  Acute Rehab OT Goals Patient Stated Goal: "I want to get out and mope around again" OT Goal Formulation: With patient Time For Goal Achievement: 07/07/21 Potential to Achieve Goals: Fair ADL Goals Pt Will Perform Grooming: with min assist;bed level;sitting Pt Will Perform Upper Body Bathing: with min assist;sitting;bed level Pt Will Perform Lower Body Bathing: with max assist;sitting/lateral leans;sit to/from stand Pt Will Perform Upper Body  Dressing: with min assist;sitting;bed level Pt Will Perform Lower Body Dressing: with max assist;sitting/lateral leans;sit to/from stand Pt Will Transfer to Toilet: with max assist;bedside commode;stand pivot transfer Pt Will Perform Toileting - Clothing Manipulation and hygiene: with max assist;sitting/lateral leans;sit to/from stand  Plan Discharge plan remains appropriate;Frequency remains appropriate    Co-evaluation    PT/OT/SLP Co-Evaluation/Treatment: Yes Reason for Co-Treatment: Complexity of the patient's impairments (multi-system involvement);Necessary to address cognition/behavior during functional activity;For patient/therapist safety;To address functional/ADL transfers PT goals addressed during session: Mobility/safety with mobility OT goals addressed during session: ADL's and self-care      AM-PAC OT "6 Clicks" Daily Activity     Outcome Measure   Help from another person eating meals?: A Lot Help from another person taking care of personal grooming?: A Lot Help from another person toileting, which includes using toliet, bedpan, or urinal?: Total Help from another person bathing (including washing, rinsing, drying)?: Total Help from another person to put on and taking off regular upper body clothing?: A Lot Help from another person to put on and taking off regular lower body clothing?: Total 6 Click Score: 9    End of Session    OT Visit Diagnosis: History of falling (Z91.81);Muscle weakness (generalized) (M62.81);Other symptoms and signs involving cognitive  function;Pain;Other abnormalities of gait and mobility (R26.89) Pain - Right/Left: Left Pain - part of body: Arm;Hip;Leg   Activity Tolerance Patient limited by fatigue;Patient limited by lethargy   Patient Left in bed;with call bell/phone within reach   Nurse Communication Mobility status;Other (comment) (Needs prevalon boots)        Time: 3085-6943 OT Time Calculation (min): 31 min  Charges: OT  General Charges $OT Visit: 1 Visit OT Treatments $Self Care/Home Management : 8-22 mins  [place]ston Longton H., OTR/L Acute Rehabilitation  Alyxandra Tenbrink Elane Yolanda Bonine 06/24/2021, 4:49 PM

## 2021-06-24 NOTE — Progress Notes (Signed)
Patient ID: David [name], male   DOB: [birth year], 80 y.o.   MRN: [id number] S: Only tolerated 1.5 hrs of HD due to hypotension and apparently had N/V prior to HD. O:BP 120/72   Pulse 87   Temp 98 F (36.7 C) (Oral)   Resp 17   Ht 6\' 2"  (1.88 m)   Wt 80.2 kg   SpO2 100%   BMI 22.70 kg/m   Intake/Output Summary (Last 24 hours) at 06/24/2021 1037 Last data filed at 06/24/2021 0901 Gross per 24 hour  Intake 1469.55 ml  Output -323 ml  Net 1792.55 ml   Intake/Output: I/O last 3 completed shifts: In: 2166.9 [P.O.:1135; I.V.:941.2; IV Piggyback:90.7] Out: -322 [Stool:2]  Intake/Output this shift:  Total I/O In: 300 [P.O.:300] Out: -  Weight change: 1.9 kg Gen:NAD, more awake and alert today. CVS:no rub Resp: cta Abd: +BS, soft, NT/Nd Ext: no edema,  RUE AVG +T/B  Recent Labs  Lab 06/14/2021 1700 07/02/2021 2105 06/21/21 0814 06/21/21 1136 06/21/21 1742 06/22/21 0235 06/23/21 0349 06/24/21 0310  NA 137  --  137 137 138 138 139 137  K 5.4*  --  5.7* 5.4* 5.1 5.3* 5.2* 4.1  CL 93*  --  94*  --  94* 93* 94* 94*  CO2 27  --  28  --  26 27 22 26   GLUCOSE 114*  --  99  --  94 126* 120* 123*  BUN 56*  --  75*  --  44* 44* 74* 65*  CREATININE 9.26* 9.68* 10.68*  --  7.23* 7.51* 9.33* 7.75*  ALBUMIN 3.2*  --  2.9*  --  3.5 3.1* 2.7* 2.6*  CALCIUM 8.9  --  8.9  --  9.5 9.6 9.5 9.1  PHOS 8.5*  --  10.3*  --  7.7* 8.2* 10.4* 7.7*  AST 29  --  24  --  42* 51*  --   --   ALT 7  --  6  --  12 13  --   --    Liver Function Tests: Recent Labs  Lab 06/21/21 0814 06/21/21 1742 06/22/21 0235 06/23/21 0349 06/24/21 0310  AST 24 42* 51*  --   --   ALT 6 12 13   --   --   ALKPHOS 105 126 122  --   --   BILITOT 1.1 1.7* 1.0  --   --   PROT 6.4* 8.2* 7.6  --   --   ALBUMIN 2.9* 3.5 3.1* 2.7* 2.6*   No results for input(s): LIPASE, AMYLASE in the last 168 hours. Recent Labs  Lab 06/08/2021 1700  AMMONIA 31   CBC: Recent Labs  Lab 06/21/21 0814 06/21/21 1136  06/21/21 1843 06/22/21 0235 06/23/21 0349 06/24/21 0310  WBC 10.3  --  17.4* 16.6* 13.1* 9.7  NEUTROABS 7.9*  --  15.2* 13.4*  --   --   HGB 9.8*   < > 12.1* 12.2* 11.1* 9.6*  HCT 29.6*   < > 37.8* 36.5* 32.1* 28.4*  MCV 108.4*  --  110.5* 105.5* 102.9* 102.2*  PLT 260  --  214 272 276 276   < > = values in this interval not displayed.   Cardiac Enzymes: No results for input(s): CKTOTAL, CKMB, CKMBINDEX, TROPONINI in the last 168 hours. CBG: Recent Labs  Lab 06/12/2021 1529 06/19/2021 2212 06/21/21 0831  GLUCAP 116* 111* 110*    Iron Studies: No results for input(s): IRON, TIBC, TRANSFERRIN, FERRITIN in the last 72  hours. Studies/Results: EEG adult  Result Date: 06/22/2021 Lora Havens, MD     06/22/2021  3:34 PM Patient Name: David [name] MRN: [id number] Epilepsy Attending: Lora Havens Referring Physician/Provider: Dr Carlisle Cater Date: 06/22/2021 Duration: 24.32 mins Patient history: 80 year old male with altered mental status.  EEG to evaluate for seizures. Level of alertness: Awake, asleep AEDs during EEG study: None Technical aspects: This EEG study was done with scalp electrodes positioned according to the 10-20 International system of electrode placement. Electrical activity was acquired at a sampling rate of 500Hz  and reviewed with a high frequency filter of 70Hz  and a low frequency filter of 1Hz . EEG data were recorded continuously and digitally stored. Description: No clear posterior dominant rhythm was seen. Sleep was characterized by vertex waves, sleep spindles (12 to 14 Hz), maximal frontocentral region.  EEG also showed continuous 6-8 Hz theta-alpha activity as well as intermittent generalized 2 to 3 Hz delta slowing.  Hyperventilation and photic stimulation were not performed.   ABNORMALITY - Continuous slow, generalized IMPRESSION: This study is suggestive of moderate diffuse encephalopathy, nonspecific etiology. No seizures or epileptiform discharges were  seen throughout the recording. Priyanka Barbra Sarks    (feeding supplement) PROSource Plus  30 mL Oral TID BM   allopurinol  100 mg Oral Once per day on Mon Tue Thu Sat   amiodarone  200 mg Oral Daily   bictegravir-emtricitabine-tenofovir AF  1 tablet Oral Daily   brimonidine  1 drop Right Eye BID   Chlorhexidine Gluconate Cloth  6 each Topical Q0600   midodrine  10 mg Oral 2 times per day on Tue Thu Sat   midodrine  5 mg Oral Once per day on Sun Mon Wed Fri   montelukast  10 mg Oral QHS   multivitamin  1 tablet Oral QPM   ofloxacin  1 drop Right Eye QID   pantoprazole (PROTONIX) IV  40 mg Intravenous Daily   sucroferric oxyhydroxide  500 mg Oral TID with meals    BMET    Component Value Date/Time   NA 137 06/24/2021 0310   NA 142 04/24/2018 1641   K 4.1 06/24/2021 0310   CL 94 (L) 06/24/2021 0310   CO2 26 06/24/2021 0310   GLUCOSE 123 (H) 06/24/2021 0310   BUN 65 (H) 06/24/2021 0310   BUN 31 (H) 04/24/2018 1641   CREATININE 7.75 (H) 06/24/2021 0310   CREATININE 7.71 (H) 03/19/2019 1140   CALCIUM 9.1 06/24/2021 0310   CALCIUM 8.6 03/14/2016 0925   GFRNONAA 7 (L) 06/24/2021 0310   GFRNONAA 6 (L) 03/19/2019 1140   GFRAA 8 (L) 05/18/2020 0614   GFRAA 7 (L) 03/19/2019 1140   CBC    Component Value Date/Time   WBC 9.7 06/24/2021 0310   RBC 2.78 (L) 06/24/2021 0310   HGB 9.6 (L) 06/24/2021 0310   HGB 11.1 (L) 03/06/2013 1138   HCT 28.4 (L) 06/24/2021 0310   HCT 34.3 (L) 03/12/2019 0358   HCT 33.8 (L) 03/06/2013 1138   PLT 276 06/24/2021 0310   PLT 177 03/06/2013 1138   MCV 102.2 (H) 06/24/2021 0310   MCV 101.4 (A) 04/24/2018 1742   MCV 95 03/06/2013 1138   MCH 34.5 (H) 06/24/2021 0310   MCHC 33.8 06/24/2021 0310   RDW 15.0 06/24/2021 0310   RDW 13.4 03/06/2013 1138   LYMPHSABS 0.8 06/22/2021 0235   LYMPHSABS 1.5 03/06/2013 1138   MONOABS 2.3 (H) 06/22/2021 0235   EOSABS 0.0 06/22/2021 0235  EOSABS 0.3 03/06/2013 1138   BASOSABS 0.0 06/22/2021 0235   BASOSABS 0.0  03/06/2013 1138   Dialysis Orders:  MTTS at [facility] 4hr, 400/A1.5, EDW 82kg, 2K/2Ca, UFP #2, AVG, heparin 2000 bolus - Hectoral 50mcg IV q HD - Venofer 50mg  IV weekly - Mircera 54mcg IV q 2 weeks   Assessment/Plan:  Lethargy/weakness/UTI: CT/MRI without acute findings. Urine culture + for >100,000 CFU gram negative rods.  Awaiting ID and sensitivities.  Currently on Rocephin.  ESRD:  Will plan to keep on his [facility]Th[facility] schedule. If any component of over-medication contributing to current state, hopefully will slightly improve with dialysis. Hyperkalemia - unclear why K is higher than pre-HD yesterday.  Will dose with lokelma 10 grams daily and follow.  Will also use low K bath.  Hypotension/volume: Chronically low BP (on midodrine) and with ^ BNP - UF as tolerated.  below his edw and unable to tolerate UF with HD yesterday due to low bp.  Continue with midodrine and UF as tolerated.  Appears euvolemic.   Anemia: Hgb 12.2- Hold esa for now.   Metabolic bone disease: Ca ok, Phos very high - needs to resume home binders once able to eat.  Nutrition:  Alb very low, to add supplements once eating.  HFrEF: Repeat echo pending.  HIV: Continue home meds.  A-fib: On Eliquis, amiodarone, cardiology following.     [name], MD [employer] [phone]

## 2021-06-25 DIAGNOSIS — I4819 Other persistent atrial fibrillation: Secondary | ICD-10-CM | POA: Diagnosis not present

## 2021-06-25 DIAGNOSIS — I5023 Acute on chronic systolic (congestive) heart failure: Secondary | ICD-10-CM | POA: Diagnosis not present

## 2021-06-25 DIAGNOSIS — N186 End stage renal disease: Secondary | ICD-10-CM | POA: Diagnosis not present

## 2021-06-25 DIAGNOSIS — I4891 Unspecified atrial fibrillation: Secondary | ICD-10-CM | POA: Diagnosis not present

## 2021-06-25 LAB — CULTURE, BLOOD (ROUTINE X 2)
Culture: NO GROWTH
Culture: NO GROWTH
Special Requests: ADEQUATE

## 2021-06-25 LAB — RENAL FUNCTION PANEL
Albumin: 2.4 g/dL — ABNORMAL LOW (ref 3.5–5.0)
Anion gap: 20 — ABNORMAL HIGH (ref 5–15)
BUN: 87 mg/dL — ABNORMAL HIGH (ref 8–23)
CO2: 24 mmol/L (ref 22–32)
Calcium: 9.3 mg/dL (ref 8.9–10.3)
Chloride: 92 mmol/L — ABNORMAL LOW (ref 98–111)
Creatinine, Ser: 9.6 mg/dL — ABNORMAL HIGH (ref 0.61–1.24)
GFR, Estimated: 5 mL/min — ABNORMAL LOW (ref >60)
Glucose, Bld: 110 mg/dL — ABNORMAL HIGH (ref 70–99)
Phosphorus: 9 mg/dL — ABNORMAL HIGH (ref 2.5–4.6)
Potassium: 3.9 mmol/L (ref 3.5–5.1)
Sodium: 136 mmol/L (ref 135–145)

## 2021-06-25 LAB — CBC
HCT: 28.6 % — ABNORMAL LOW (ref 39.0–52.0)
Hemoglobin: 9.6 g/dL — ABNORMAL LOW (ref 13.0–17.0)
MCH: 34.4 pg — ABNORMAL HIGH (ref 26.0–34.0)
MCHC: 33.6 g/dL (ref 30.0–36.0)
MCV: 102.5 fL — ABNORMAL HIGH (ref 80.0–100.0)
Platelets: 263 10*3/uL (ref 150–400)
RBC: 2.79 MIL/uL — ABNORMAL LOW (ref 4.22–5.81)
RDW: 15.1 % (ref 11.5–15.5)
WBC: 11.2 10*3/uL — ABNORMAL HIGH (ref 4.0–10.5)
nRBC: 0 % (ref 0.0–0.2)

## 2021-06-25 LAB — PROCALCITONIN: Procalcitonin: 22.8 ng/mL

## 2021-06-25 MED ORDER — PANTOPRAZOLE SODIUM 40 MG PO TBEC
40.0000 mg | DELAYED_RELEASE_TABLET | Freq: Every day | ORAL | Status: DC
  Administered 2021-06-25: 40 mg via ORAL
  Filled 2021-06-25: qty 1

## 2021-06-25 NOTE — Progress Notes (Signed)
PROGRESS NOTE    David [name]  BBC:[id number] DOB: 06-Dec-1940 DOA: 06/18/2021 PCP: [name], NP   Brief Narrative:  80 year old chronically ill-appearing male with a past medical history significant for but not limited to ESRD on hemodialysis M/T/T/S, Afib on [facility], history of gout, chronic hypotension, GERD, recent admission for right hip fracture status post repair presented from his SNF for generalized weakness and nausea.  Work-up in the ED revealed electrolyte disturbances, elevated BNP greater than 4000, mild pulmonary edema with concern for acute CHF and elevated troponin.  TRH was asked to admit this patient but patient was unable to provide a subjective history due to his lethargy.  He underwent head imaging with a head CT scan as well as a MRI.  Head CT done showed no acute intracranial findings but did show chronic microvascular ischemic changes and cerebral volume loss.  MRI done showed no acute intracranial abnormality and showed grade generalized volume loss and findings of chronic microvascular ischemia. On 7/19, after HD, was found to be in A. fib with RVR, cardiology consulted.  Patient admitted for further management.    Assessment & Plan:   Active Problems:   ESRD (end stage renal disease) on dialysis (HCC)   Acute on chronic systolic CHF (congestive heart failure), NYHA class 4 (HCC)   Atrial fibrillation with RVR (HCC)   Generalized weakness   A-fib (HCC)   Possible acute metabolic encephalopathy  Possible UTI Noted 100.8 fever on 06/21/2021, currently afebrile with leukocytosis UA with large leukocytes, greater than 50 WBC UC growing >100,000 Acinetobacter calcoaceticus/baumannii complex, start Unasyn, stop ceftriaxone BC x2 NGTD Chest x-ray unremarkable for any infection Procalcitonin elevated at 29 Head CT and MRI brain done showed no acute intracranial findings but did show chronic microvascular ischemic changes and cerebral volume loss.   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 fib on Eliquis Currently in A. 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 Hemoglobin is stable No overt bleeding Iron supplementation by nephrology Daily CBC  Goals of care discussion Patient with very poor prognosis, multiple comorbidities Palliative care consulted     DVT prophylaxis: Eliquis Code Status: FULL CODE  Family Communication: Discussed with Wife at bedside on 06/22/2021 Disposition Plan: Pending further clinical improvement   Status is: Inpatient  The patient will require care spanning > 2 midnights and should be moved to inpatient because: Unsafe d/c plan, IV treatments appropriate due to intensity of illness or inability to take PO, and Inpatient level of care appropriate due to severity of illness  Dispo:  Patient From: Skilled Nursing  Facility  Planned Disposition: [place]  Medically stable for discharge: No       Consultants:   Cardiology Nephrology   Procedures: ECHOCARDIOGRAM IMPRESSIONS     1. Left ventricular ejection fraction, by estimation, is 20 to 25%. The  left ventricle has severely decreased function. The left ventricle  demonstrates global hypokinesis. The left ventricular internal cavity size  was severely dilated. Left ventricular  diastolic parameters are consistent with Grade II diastolic dysfunction  (pseudonormalization). Elevated left atrial pressure.   2. Right ventricular systolic function is mildly reduced. The right  ventricular size is severely enlarged. There is normal pulmonary artery  systolic pressure. The estimated right ventricular systolic pressure is  85.6 mmHg.   3. Left atrial size was moderately dilated.   4. Right atrial size was mildly dilated.   5. The mitral valve is normal in structure. Severe mitral valve  regurgitation.   6. The aortic valve is tricuspid. Aortic valve regurgitation is mild to  moderate. Mild to moderate aortic valve stenosis.   7. There is mild dilatation of the ascending aorta, measuring 42 mm.   8. The inferior vena cava is dilated in size with >50% respiratory  variability, suggesting right atrial pressure of 8 mmHg.   Comparison(s): Prior images reviewed side by side. The left ventricular  function is unchanged. The right ventricular systolic function is worse.  Mitral regurgitation appears more severe. There is systolic flow reversal  in the pulmonary veins. In a  hemodialysis patient, this may be due to different loading conditions.   FINDINGS   Left Ventricle: Left ventricular ejection fraction, by estimation, is 20  to 25%. The left ventricle has severely decreased function. The left  ventricle demonstrates global hypokinesis. The left ventricular internal  cavity size was severely dilated.  There is no left ventricular hypertrophy. Abnormal (paradoxical) septal  motion, consistent with left bundle branch block. Left ventricular   diastolic parameters are consistent with Grade II diastolic dysfunction  (pseudonormalization). Elevated left atrial   pressure.   Right Ventricle: The right ventricular size is severely enlarged. No  increase in right ventricular wall thickness. Right ventricular systolic  function is mildly reduced. There is normal pulmonary artery systolic  pressure. The tricuspid regurgitant  velocity is 2.44 m/s, and with an assumed right atrial pressure of 8 mmHg,  the estimated right ventricular systolic pressure is 31.4 mmHg.   Left Atrium: Left atrial size was moderately dilated.   Right Atrium: Right atrial size was mildly dilated.   Pericardium: Trivial pericardial effusion is present.   Mitral Valve: There is diastolic MR due to long AV delay/first degree AV  block. The mitral valve is normal in structure. Severe mitral valve  regurgitation, with centrally-directed jet.   Tricuspid Valve: The tricuspid valve is normal in structure. Tricuspid  valve regurgitation is trivial.   Aortic Valve: The aortic valve is tricuspid. Aortic valve regurgitation is  mild to moderate. Aortic regurgitation PHT measures 359 msec. Mild to  moderate aortic stenosis is present. Aortic valve mean gradient measures  11.0 mmHg. Aortic valve peak  gradient measures 21.3 mmHg. Aortic valve area, by VTI measures 1.26 cm.   Pulmonic Valve: The pulmonic valve was grossly normal. Pulmonic valve  regurgitation is not visualized.   Aorta: The aortic root is normal in size and structure. There is mild  dilatation of the ascending aorta, measuring 42 mm.   Venous: A pattern of systolic flow reversal, suggestive of severe mitral  regurgitation is  recorded from the right upper pulmonary vein. The  inferior vena cava is dilated in size with greater than 50% respiratory  variability, suggesting right atrial  pressure of 8 mmHg.   IAS/Shunts: No atrial level shunt detected by color flow Doppler.      LEFT  VENTRICLE  PLAX 2D  LVIDd:         7.07 cm  Diastology  LVIDs:         6.20 cm  LV e' medial:    4.03 cm/s  LV PW:         1.60 cm  LV E/e' medial:  35.0  LV IVS:        1.00 cm  LV e' lateral:   6.42 cm/s  LVOT diam:     1.90 cm  LV E/e' lateral: 22.0  LV SV:         47  LV SV Index:   23  LVOT Area:     2.84 cm      RIGHT VENTRICLE             IVC  RV Basal diam:  3.80 cm     IVC diam: 2.10 cm  RV Mid diam:    2.80 cm  RV S prime:     11.60 cm/s  TAPSE (M-mode): 2.0 cm   LEFT ATRIUM             Index       RIGHT ATRIUM           Index  LA diam:        4.30 cm 2.08 cm/m  RA Area:     18.40 cm  LA Vol (A2C):   79.3 ml 38.37 ml/m RA Volume:   51.50 ml  24.92 ml/m  LA Vol (A4C):   68.8 ml 33.29 ml/m  LA Biplane Vol: 74.4 ml 36.00 ml/m   AORTIC VALVE  AV Area (Vmax):    1.29 cm  AV Area (Vmean):   1.25 cm  AV Area (VTI):     1.26 cm  AV Vmax:           231.00 cm/s  AV Vmean:          157.500 cm/s  AV VTI:            0.375 m  AV Peak Grad:      21.3 mmHg  AV Mean Grad:      11.0 mmHg  LVOT Vmax:         105.00 cm/s  LVOT Vmean:        69.200 cm/s  LVOT VTI:          0.167 m  LVOT/AV VTI ratio: 0.45  AI PHT:            359 msec     AORTA  Ao Root diam: 3.00 cm  Ao Asc diam:  4.20 cm   MITRAL VALVE                 TRICUSPID VALVE  MV Area (PHT): 3.60 cm      TR Peak grad:   23.8 mmHg  MV Decel Time: 211 msec      TR Vmax:        244.00 cm/s  MR Peak grad:    95.8 mmHg  MR Mean grad:    59.5 mmHg   SHUNTS  MR Vmax:         489.50 cm/s Systemic VTI:  0.17 m  MR Vmean:        364.0 cm/s  Systemic Diam: 1.90 cm  MR PISA:         1.01 cm  MR PISA Eff ROA: 8 mm  MR PISA Radius:  0.40 cm  MV E velocity: 141.00 cm/s  MV A velocity: 110.00 cm/s  MV E/A ratio:  1.28   Antimicrobials:  Anti-infectives (From admission, onward)    Start     Dose/Rate Route Frequency Ordered Stop   06/24/21 1800  Ampicillin-Sulbactam (UNASYN) 3 g in sodium chloride 0.9 % 100 mL  IVPB        3 g 200 mL/hr over 30 Minutes Intravenous Every 12 hours 06/24/21 1617     06/21/21 2315  cefTRIAXone (ROCEPHIN) 2 g in sodium chloride 0.9 % 100 mL IVPB  Status:  Discontinued        2 g 200 mL/hr over 30 Minutes Intravenous Every 24 hours 06/21/21 2227 06/24/21 1617   06/21/21 1000  bictegravir-emtricitabine-tenofovir AF ([name]) 50-200-25 MG per tablet 1 tablet        1 tablet Oral Daily 06/15/2021 2213          Subjective: Patient denies any new complaints, noted to be somewhat lethargic today with an episode of vomiting.  Possibly uremic, unable to be dialyzed today due to AVG clots, nephrology consulted IR for declot.   Objective: Vitals:   06/25/21 0555 06/25/21 0755 06/25/21 0800 06/25/21 0804  BP: 131/73 (!) 143/77 (!) 134/91   Pulse: 85 62  (!) 110  Resp: (!) 22     Temp: 98.7 F (37.1 C)     TempSrc: Oral     SpO2: 100%     Weight:      Height:        Intake/Output Summary (Last 24 hours) at 06/25/2021 1747 Last data filed at 06/25/2021 0600 Gross per 24 hour  Intake 480 ml  Output 1 ml  Net 479 ml   Filed Weights   06/23/21 2040 06/24/21 0404 06/25/21 0551  Weight: 81.4 kg 80.2 kg 81.2 kg   Examination: Physical Exam: General: NAD, very deconditioned, chronically ill-appearing Cardiovascular: S1, S2 present Respiratory: Diminished breath sounds bilaterally Abdomen: Soft, nontender, nondistended, bowel sounds present Musculoskeletal: No bilateral pedal edema noted Skin: Normal Psychiatry: Normal mood     Data Reviewed: I have personally reviewed following labs and imaging studies  CBC: Recent Labs  Lab 06/05/2021 1700 06/24/2021 2105 06/21/21 0814 06/21/21 1136 06/21/21 1843 06/22/21 0235 06/23/21 0349 06/24/21 0310 06/25/21 0247  WBC 9.3   < > 10.3  --  17.4* 16.6* 13.1* 9.7 11.2*  NEUTROABS 7.4  --  7.9*  --  15.2* 13.4*  --   --   --   HGB 10.7*   < > 9.8*   < > 12.1* 12.2* 11.1* 9.6* 9.6*  HCT 32.9*   < > 29.6*   < > 37.8*  36.5* 32.1* 28.4* 28.6*  MCV 108.2*   < > 108.4*  --  110.5* 105.5* 102.9* 102.2* 102.5*  PLT 281   < > 260  --  214 272 276 276 263   < > = values in this interval not displayed.   Basic Metabolic Panel: Recent Labs  Lab 06/09/2021 1700 06/25/2021 2105 06/21/21 0814 06/21/21 1136 06/21/21 1742 06/22/21 0235 06/23/21 0349 06/24/21 0310 06/25/21 0247  NA 137  --  137   < > 138 138 139 137 136  K 5.4*  --  5.7*   < > 5.1 5.3* 5.2* 4.1 3.9  CL 93*  --  94*  --  94* 93* 94* 94* 92*  CO2 27  --  28  --  26 27 22 26 24   GLUCOSE 114*  --  99  --  94 126* 120* 123* 110*  BUN 56*  --  75*  --  44* 44* 74* 65* 87*  CREATININE 9.26*   < > 10.68*  --  7.23* 7.51* 9.33* 7.75* 9.60*  CALCIUM 8.9  --  8.9  --  9.5 9.6 9.5 9.1 9.3  MG 2.6*  --  2.7*  --  2.6* 2.6*  --   --   --   PHOS 8.5*  --  10.3*  --  7.7* 8.2* 10.4* 7.7* 9.0*   < > = values in this interval not displayed.   GFR: Estimated Creatinine Clearance: 7.2 mL/min (A) (by C-G formula based on SCr of 9.6 mg/dL (H)). Liver Function Tests: Recent Labs  Lab 06/15/2021 1700 06/21/21 0814 06/21/21 1742 06/22/21 0235 06/23/21 0349 06/24/21 0310 06/25/21 0247  AST 29 24 42* 51*  --   --   --   ALT 7 6 12 13   --   --   --   ALKPHOS 123 105 126 122  --   --   --   BILITOT 1.1 1.1 1.7* 1.0  --   --   --   PROT 7.0 6.4* 8.2* 7.6  --   --   --   ALBUMIN 3.2* 2.9* 3.5 3.1* 2.7* 2.6* 2.4*   No results for input(s): LIPASE, AMYLASE in the last 168 hours. Recent Labs  Lab 06/11/2021 1700  AMMONIA 31   Coagulation Profile: Recent Labs  Lab 06/05/2021 1700  INR 1.8*   Cardiac Enzymes: No results for input(s): CKTOTAL, CKMB, CKMBINDEX, TROPONINI in the last 168 hours. BNP (last 3 results) No results for input(s): PROBNP in the last 8760 hours. HbA1C: No results for input(s): HGBA1C in the last 72 hours. CBG: Recent Labs  Lab 06/23/2021 1529 06/12/2021 2212 06/21/21 0831  GLUCAP 116* 111* 110*   Lipid Profile: No results for  input(s): CHOL, HDL, LDLCALC, TRIG, CHOLHDL, LDLDIRECT in the last 72 hours. Thyroid Function Tests: No results for input(s): TSH, T4TOTAL, FREET4, T3FREE, THYROIDAB in the last 72 hours.  Anemia Panel: No results for input(s): VITAMINB12, FOLATE, FERRITIN, TIBC, IRON, RETICCTPCT in the last 72 hours.  Sepsis Labs: Recent Labs  Lab 06/27/2021 1700 06/09/2021 2105 06/21/21 2318 06/22/21 0235 06/23/21 0349 06/24/21 0310 06/25/21 0247  PROCALCITON  --   --   --   --  29.91 26.07 22.80  LATICACIDVEN 1.8 1.6 1.8 1.7  --   --   --     Recent Results (from the past 240 hour(s))  Resp Panel by RT-PCR (Flu A&B, Covid) Nasopharyngeal Swab     Status: None   Collection Time: 06/16/21  5:04 PM   Specimen: Nasopharyngeal Swab; Nasopharyngeal(NP) swabs in vial transport medium  Result Value Ref Range Status   SARS Coronavirus 2 by RT PCR NEGATIVE NEGATIVE Final    Comment: (NOTE) SARS-CoV-2 target nucleic acids are NOT DETECTED.  The SARS-CoV-2 RNA is generally detectable in upper respiratory specimens during the acute phase of infection. The lowest concentration of SARS-CoV-2 viral copies this assay can detect is 138 copies/mL. A negative result does not preclude SARS-Cov-2 infection and should not be used as the sole basis for treatment or other patient  management decisions. A negative result may occur with  improper specimen collection/handling, submission of specimen other than nasopharyngeal swab, presence of viral mutation(s) within the areas targeted by this assay, and inadequate number of viral copies(<138 copies/mL). A negative result must be combined with clinical observations, patient history, and epidemiological information. The expected result is Negative.  Fact Sheet for Patients:  EntrepreneurPulse.com.au  Fact Sheet for Healthcare Providers:  IncredibleEmployment.be  This test is no t yet approved or cleared by the [place] FDA  and  has been authorized for detection and/or diagnosis of SARS-CoV-2 by FDA under an Emergency Use Authorization (EUA). This EUA will remain  in effect (meaning this test can be used) for the duration of the COVID-19 declaration under Section 564(b)(1) of the Act, 21 U.S.C.section 360bbb-3(b)(1), unless the authorization is terminated  or revoked sooner.       Influenza A by PCR NEGATIVE NEGATIVE Final   Influenza B by PCR NEGATIVE NEGATIVE Final    Comment: (NOTE) The Xpert Xpress SARS-CoV-2/FLU/RSV plus assay is intended as an aid in the diagnosis of influenza from Nasopharyngeal swab specimens and should not be used as a sole basis for treatment. Nasal washings and aspirates are unacceptable for Xpert Xpress SARS-CoV-2/FLU/RSV testing.  Fact Sheet for Patients: EntrepreneurPulse.com.au  Fact Sheet for Healthcare Providers: IncredibleEmployment.be  This test is not yet approved or cleared by the [place] FDA and has been authorized for detection and/or diagnosis of SARS-CoV-2 by FDA under an Emergency Use Authorization (EUA). This EUA will remain in effect (meaning this test can be used) for the duration of the COVID-19 declaration under Section 564(b)(1) of the Act, 21 U.S.C. section 360bbb-3(b)(1), unless the authorization is terminated or revoked.  Performed at [facility], [place] [address], [place], [place] [address]   Blood culture (routine x 2)     Status: None   Collection Time: 06/06/2021  3:53 PM   Specimen: BLOOD  Result Value Ref Range Status   Specimen Description BLOOD SITE NOT SPECIFIED  Final   Special Requests   Final    BOTTLES DRAWN AEROBIC AND ANAEROBIC Blood Culture results may not be optimal due to an excessive volume of blood received in culture bottles IV START   Culture   Final    NO GROWTH 5 DAYS Performed at [facility], [place] [address], [place], [place] [address]    Report Status 06/25/2021  FINAL  Final  SARS CORONAVIRUS 2 (TAT 6-24 HRS) Nasopharyngeal Nasopharyngeal Swab     Status: None   Collection Time: 06/03/2021  4:50 PM   Specimen: Nasopharyngeal Swab  Result Value Ref Range Status   SARS Coronavirus 2 NEGATIVE NEGATIVE Final    Comment: (NOTE) SARS-CoV-2 target nucleic acids are NOT DETECTED.  The SARS-CoV-2 RNA is generally detectable in upper and lower respiratory specimens during the acute phase of infection. Negative results do not preclude SARS-CoV-2 infection, do not rule out co-infections with other pathogens, and should not be used as the sole basis for treatment or other patient management decisions. Negative results must be combined with clinical observations, patient history, and epidemiological information. The expected result is Negative.  Fact Sheet for Patients: SugarRoll.be  Fact Sheet for Healthcare Providers: https://www.woods-mathews.com/  This test is not yet approved or cleared by the [place] FDA and  has been authorized for detection and/or diagnosis of SARS-CoV-2 by FDA under an Emergency Use Authorization (EUA). This EUA will remain  in effect (meaning this test can  be used) for the duration of the COVID-19 declaration under Se ction 564(b)(1) of the Act, 21 U.S.C. section 360bbb-3(b)(1), unless the authorization is terminated or revoked sooner.  Performed at [facility], [place] [address], [place], [place] [address]   Blood culture (routine x 2)     Status: None   Collection Time: 06/05/2021  9:08 PM   Specimen: BLOOD  Result Value Ref Range Status   Specimen Description BLOOD LEFT HAND  Final   Special Requests   Final    BOTTLES DRAWN AEROBIC AND ANAEROBIC Blood Culture adequate volume   Culture   Final    NO GROWTH 5 DAYS Performed at [facility], [place] [address], [place], [place] [address]    Report Status 06/25/2021 FINAL  Final  Culture, blood (routine x 2)      Status: None (Preliminary result)   Collection Time: 06/21/21 11:07 PM   Specimen: BLOOD LEFT HAND  Result Value Ref Range Status   Specimen Description BLOOD LEFT HAND  Final   Special Requests   Final    BOTTLES DRAWN AEROBIC ONLY Blood Culture results may not be optimal due to an inadequate volume of blood received in culture bottles   Culture   Final    NO GROWTH 3 DAYS Performed at [facility], [place] [address], [place], [place] [address]    Report Status PENDING  Incomplete  Culture, blood (routine x 2)     Status: None (Preliminary result)   Collection Time: 06/21/21 11:14 PM   Specimen: BLOOD  Result Value Ref Range Status   Specimen Description BLOOD LEFT THUMB  Final   Special Requests   Final    BOTTLES DRAWN AEROBIC ONLY Blood Culture results may not be optimal due to an inadequate volume of blood received in culture bottles   Culture   Final    NO GROWTH 3 DAYS Performed at [facility], [place] [address] [place], [place] [address]    Report Status PENDING  Incomplete  Urine Culture     Status: Abnormal   Collection Time: 06/22/21 10:02 AM   Specimen: Urine, Clean Catch  Result Value Ref Range Status   Specimen Description URINE, CLEAN CATCH  Final   Special Requests   Final    NONE Performed at [facility], [place] [address], [place], [place] [address]    Culture (A)  Final    >=100,000 COLONIES/mL ACINETOBACTER CALCOACETICUS/BAUMANNII COMPLEX   Report Status 06/24/2021 FINAL  Final   Organism ID, Bacteria ACINETOBACTER CALCOACETICUS/BAUMANNII COMPLEX (A)  Final      Susceptibility   Acinetobacter calcoaceticus/baumannii complex - MIC*    CEFTAZIDIME 8 SENSITIVE Sensitive     CIPROFLOXACIN 0.5 SENSITIVE Sensitive     GENTAMICIN <=1 SENSITIVE Sensitive     IMIPENEM <=0.25 SENSITIVE Sensitive     PIP/TAZO <=4 SENSITIVE Sensitive     TRIMETH/SULFA <=20 SENSITIVE Sensitive     AMPICILLIN/SULBACTAM <=2 SENSITIVE Sensitive     * >=100,000 COLONIES/mL  ACINETOBACTER CALCOACETICUS/BAUMANNII COMPLEX    RN Pressure Injury Documentation:     Estimated body mass index is 22.98 kg/m as calculated from the following:   Height as of this encounter: 6\' 2"  (1.88 m).   Weight as of this encounter: 81.2 kg.  Malnutrition Type: Nutrition Problem: Increased nutrient needs Etiology: chronic illness (ESRD on HD) Malnutrition Characteristics: Signs/Symptoms: estimated needs Nutrition Interventions: Interventions: MVI, Prostat, Magic cup  Radiology Studies: No results found.  Scheduled Meds:  (feeding supplement) PROSource Plus  30 mL Oral TID BM   allopurinol  100 mg Oral Once per day on Mon Tue Thu Sat   amiodarone  200 mg Oral Daily   apixaban  5 mg Oral BID   bictegravir-emtricitabine-tenofovir AF  1 tablet Oral Daily   brimonidine  1 drop Right Eye BID   Chlorhexidine Gluconate Cloth  6 each Topical Q0600   midodrine  10 mg Oral 2 times per day on Tue Thu Sat   [START ON 06/26/2021] midodrine  10 mg Oral Once per day on Sun Mon Wed Fri   montelukast  10 mg Oral QHS   multivitamin  1 tablet Oral QPM   ofloxacin  1 drop Right Eye QID   pantoprazole  40 mg Oral Daily   sucroferric oxyhydroxide  500 mg Oral TID with meals   Continuous Infusions:  ampicillin-sulbactam (UNASYN) IV 3 g (06/25/21 0834)    LOS: 3 days   Alma Friendly, MD [facility] PAGER is on [place]  If 7PM-7AM, please contact night-coverage [email or web address]

## 2021-06-25 NOTE — Progress Notes (Signed)
Patient;s AVG with no briutt and thrill. Dr. Jonnie Finner and Shirlee Limer[place], [place] made aware.

## 2021-06-25 NOTE — Progress Notes (Addendum)
Patient ID: David [name], male   DOB: September 20, 1941, 80 y.o.   MRN: [id number]  S: Seen in room. Lethargic, but no complaints. Denies cp, sob. For dialysis today.    O:BP (!) 134/91   Pulse (!) 110   Temp 98.7 F (37.1 C) (Oral)   Resp (!) 22   Ht 6\' 2"  (1.88 m)   Wt 81.2 kg   SpO2 100%   BMI 22.98 kg/m   Intake/Output Summary (Last 24 hours) at 06/25/2021 1203 Last data filed at 06/25/2021 0600 Gross per 24 hour  Intake 480 ml  Output 1 ml  Net 479 ml    Intake/Output: I/O last 3 completed shifts: In: 1332.6 [P.O.:840; I.V.:492.6] Out: -323 [Stool:1]  Intake/Output this shift:  No intake/output data recorded. Weight change: 1.2 kg Gen:NAD, more awake and alert today. CVS: Irregular, no rub Resp: cta Abd: +BS, soft, NT/Nd Ext: no edema,  RUE AVG -T/B  Recent Labs  Lab 06/14/2021 1700 06/22/2021 2105 06/21/21 0814 06/21/21 1136 06/21/21 1742 06/22/21 0235 06/23/21 0349 06/24/21 0310 06/25/21 0247  NA 137  --  137 137 138 138 139 137 136  K 5.4*  --  5.7* 5.4* 5.1 5.3* 5.2* 4.1 3.9  CL 93*  --  94*  --  94* 93* 94* 94* 92*  CO2 27  --  28  --  26 27 22 26 24   GLUCOSE 114*  --  99  --  94 126* 120* 123* 110*  BUN 56*  --  75*  --  44* 44* 74* 65* 87*  CREATININE 9.26* 9.68* 10.68*  --  7.23* 7.51* 9.33* 7.75* 9.60*  ALBUMIN 3.2*  --  2.9*  --  3.5 3.1* 2.7* 2.6* 2.4*  CALCIUM 8.9  --  8.9  --  9.5 9.6 9.5 9.1 9.3  PHOS 8.5*  --  10.3*  --  7.7* 8.2* 10.4* 7.7* 9.0*  AST 29  --  24  --  42* 51*  --   --   --   ALT 7  --  6  --  12 13  --   --   --     Liver Function Tests: Recent Labs  Lab 06/21/21 0814 06/21/21 1742 06/22/21 0235 06/23/21 0349 06/24/21 0310 06/25/21 0247  AST 24 42* 51*  --   --   --   ALT 6 12 13   --   --   --   ALKPHOS 105 126 122  --   --   --   BILITOT 1.1 1.7* 1.0  --   --   --   PROT 6.4* 8.2* 7.6  --   --   --   ALBUMIN 2.9* 3.5 3.1* 2.7* 2.6* 2.4*    No results for input(s): LIPASE, AMYLASE in the last 168  hours. Recent Labs  Lab 06/22/2021 1700  AMMONIA 31    CBC: Recent Labs  Lab 06/21/21 0814 06/21/21 1136 06/21/21 1843 06/22/21 0235 06/23/21 0349 06/24/21 0310 06/25/21 0247  WBC 10.3  --  17.4* 16.6* 13.1* 9.7 11.2*  NEUTROABS 7.9*  --  15.2* 13.4*  --   --   --   HGB 9.8*   < > 12.1* 12.2* 11.1* 9.6* 9.6*  HCT 29.6*   < > 37.8* 36.5* 32.1* 28.4* 28.6*  MCV 108.4*  --  110.5* 105.5* 102.9* 102.2* 102.5*  PLT 260  --  214 272 276 276 263   < > = values in this interval not displayed.  Cardiac Enzymes: No results for input(s): CKTOTAL, CKMB, CKMBINDEX, TROPONINI in the last 168 hours. CBG: Recent Labs  Lab 06/05/2021 1529 06/29/2021 2212 06/21/21 0831  GLUCAP 116* 111* 110*     Iron Studies: No results for input(s): IRON, TIBC, TRANSFERRIN, FERRITIN in the last 72 hours. Studies/Results: No results found.  (feeding supplement) PROSource Plus  30 mL Oral TID BM   allopurinol  100 mg Oral Once per day on Mon Tue Thu Sat   amiodarone  200 mg Oral Daily   apixaban  5 mg Oral BID   bictegravir-emtricitabine-tenofovir AF  1 tablet Oral Daily   brimonidine  1 drop Right Eye BID   Chlorhexidine Gluconate Cloth  6 each Topical Q0600   midodrine  10 mg Oral 2 times per day on Tue Thu Sat   [START ON 06/26/2021] midodrine  10 mg Oral Once per day on Sun Mon Wed Fri   montelukast  10 mg Oral QHS   multivitamin  1 tablet Oral QPM   ofloxacin  1 drop Right Eye QID   pantoprazole  40 mg Oral Daily   sucroferric oxyhydroxide  500 mg Oral TID with meals    BMET    Component Value Date/Time   NA 136 06/25/2021 0247   NA 142 04/24/2018 1641   K 3.9 06/25/2021 0247   CL 92 (L) 06/25/2021 0247   CO2 24 06/25/2021 0247   GLUCOSE 110 (H) 06/25/2021 0247   BUN 87 (H) 06/25/2021 0247   BUN 31 (H) 04/24/2018 1641   CREATININE 9.60 (H) 06/25/2021 0247   CREATININE 7.71 (H) 03/19/2019 1140   CALCIUM 9.3 06/25/2021 0247   CALCIUM 8.6 03/14/2016 0925   GFRNONAA 5 (L) 06/25/2021  0247   GFRNONAA 6 (L) 03/19/2019 1140   GFRAA 8 (L) 05/18/2020 0614   GFRAA 7 (L) 03/19/2019 1140   CBC    Component Value Date/Time   WBC 11.2 (H) 06/25/2021 0247   RBC 2.79 (L) 06/25/2021 0247   HGB 9.6 (L) 06/25/2021 0247   HGB 11.1 (L) 03/06/2013 1138   HCT 28.6 (L) 06/25/2021 0247   HCT 34.3 (L) 03/12/2019 0358   HCT 33.8 (L) 03/06/2013 1138   PLT 263 06/25/2021 0247   PLT 177 03/06/2013 1138   MCV 102.5 (H) 06/25/2021 0247   MCV 101.4 (A) 04/24/2018 1742   MCV 95 03/06/2013 1138   MCH 34.4 (H) 06/25/2021 0247   MCHC 33.6 06/25/2021 0247   RDW 15.1 06/25/2021 0247   RDW 13.4 03/06/2013 1138   LYMPHSABS 0.8 06/22/2021 0235   LYMPHSABS 1.5 03/06/2013 1138   MONOABS 2.3 (H) 06/22/2021 0235   EOSABS 0.0 06/22/2021 0235   EOSABS 0.3 03/06/2013 1138   BASOSABS 0.0 06/22/2021 0235   BASOSABS 0.0 03/06/2013 1138   Dialysis Orders:  MTTS at [facility] 4hr, 400/A1.5, EDW 82kg, 2K/2Ca, UFP #2, AVG, heparin 2000 bolus - Hectoral 49mcg IV q HD - Venofer 50mg  IV weekly - Mircera 34mcg IV q 2 weeks   Assessment/Plan:  Lethargy/weakness/UTI: CT/MRI without acute findings. Urine culture + Acinetobacter. On Unasyn. Per primary.     ESRD:  Will plan to keep on his MTThS schedule. If any component of over-medication contributing to current state, hopefully will slightly improve with dialysis. Addendum: Unable to dialyze today. AVG found to be clotted on arrival to HD unit. Will consult IR for declot.  Hyperkalemia - Resolved.   Hypotension/volume: Chronically low BP (on midodrine) and with ^ BNP - UF as tolerated.  below his edw and unable to tolerate UF with HD Thursday due to low bp.  Continue with midodrine and UF as tolerated.  Appears euvolemic.   Anemia: Hgb 9.6. Resume ESA per schedule.   Metabolic bone disease: Ca ok, Phos very high - Has resumed Velphoro binder.   Nutrition:  Alb very low. Getting prot supplement   HFrEF: EF 20-25%  Cards has signed off. UF as tolerated.   HIV:  Continue home meds.  A-fib: On Eliquis, amiodarone.   David Child PA-C [facility] 06/25/2021,12:03 PM

## 2021-06-25 NOTE — Progress Notes (Signed)
Pt was having hard time swallowing medications even after crushing meds with apple sauce. He wasn't able to tolerate too much fluid intake either.  Pt threw up after taking one bite of mashed potato for lunch. Notified MD

## 2021-06-26 ENCOUNTER — Encounter (HOSPITAL_COMMUNITY): Payer: Self-pay | Admitting: Internal Medicine

## 2021-06-26 ENCOUNTER — Inpatient Hospital Stay (HOSPITAL_COMMUNITY): Payer: Medicare HMO

## 2021-06-26 DIAGNOSIS — N186 End stage renal disease: Secondary | ICD-10-CM | POA: Diagnosis not present

## 2021-06-26 DIAGNOSIS — I4819 Other persistent atrial fibrillation: Secondary | ICD-10-CM | POA: Diagnosis not present

## 2021-06-26 DIAGNOSIS — I4891 Unspecified atrial fibrillation: Secondary | ICD-10-CM | POA: Diagnosis not present

## 2021-06-26 DIAGNOSIS — R531 Weakness: Secondary | ICD-10-CM | POA: Diagnosis not present

## 2021-06-26 DIAGNOSIS — I5023 Acute on chronic systolic (congestive) heart failure: Secondary | ICD-10-CM | POA: Diagnosis not present

## 2021-06-26 DIAGNOSIS — Z515 Encounter for palliative care: Secondary | ICD-10-CM

## 2021-06-26 HISTORY — PX: IR FLUORO GUIDE CV LINE LEFT: IMG2282

## 2021-06-26 HISTORY — PX: IR US GUIDE VASC ACCESS LEFT: IMG2389

## 2021-06-26 HISTORY — PX: IR VENO/JUGULAR LEFT: IMG2274

## 2021-06-26 LAB — CBC
HCT: 33.7 % — ABNORMAL LOW (ref 39.0–52.0)
Hemoglobin: 11.4 g/dL — ABNORMAL LOW (ref 13.0–17.0)
MCH: 34.1 pg — ABNORMAL HIGH (ref 26.0–34.0)
MCHC: 33.8 g/dL (ref 30.0–36.0)
MCV: 100.9 fL — ABNORMAL HIGH (ref 80.0–100.0)
Platelets: 346 10*3/uL (ref 150–400)
RBC: 3.34 MIL/uL — ABNORMAL LOW (ref 4.22–5.81)
RDW: 15 % (ref 11.5–15.5)
WBC: 18.2 10*3/uL — ABNORMAL HIGH (ref 4.0–10.5)
nRBC: 0 % (ref 0.0–0.2)

## 2021-06-26 LAB — GLUCOSE, CAPILLARY: Glucose-Capillary: 131 mg/dL — ABNORMAL HIGH (ref 70–99)

## 2021-06-26 MED ORDER — HEPARIN SODIUM (PORCINE) 1000 UNIT/ML IJ SOLN
INTRAMUSCULAR | Status: AC
  Filled 2021-06-26: qty 1

## 2021-06-26 MED ORDER — LIDOCAINE HCL 1 % IJ SOLN
INTRAMUSCULAR | Status: AC
  Filled 2021-06-26: qty 20

## 2021-06-26 MED ORDER — APIXABAN 5 MG PO TABS: 5.0000 mg | ORAL_TABLET | Freq: Two times a day (BID) | ORAL | Status: DC

## 2021-06-26 MED ORDER — IOHEXOL 300 MG/ML  SOLN
50.0000 mL | Freq: Once | INTRAMUSCULAR | Status: AC | PRN
  Administered 2021-06-26: 10 mL via INTRAVENOUS

## 2021-06-26 MED ORDER — METOPROLOL TARTRATE 5 MG/5ML IV SOLN
5.0000 mg | Freq: Once | INTRAVENOUS | Status: AC
  Administered 2021-06-26: 5 mg via INTRAVENOUS
  Filled 2021-06-26: qty 5

## 2021-06-26 MED ORDER — SUCROFERRIC OXYHYDROXIDE 500 MG PO CHEW
1000.0000 mg | CHEWABLE_TABLET | Freq: Three times a day (TID) | ORAL | Status: DC
  Administered 2021-06-26: 1000 mg via ORAL
  Filled 2021-06-26 (×4): qty 2

## 2021-06-26 MED ORDER — HEPARIN (PORCINE) 25000 UT/250ML-% IV SOLN
2000.0000 [IU]/h | INTRAVENOUS | Status: DC
  Administered 2021-06-26: 2000 [IU]/h via INTRAVENOUS
  Filled 2021-06-26: qty 250

## 2021-06-26 MED ORDER — LIDOCAINE HCL (PF) 1 % IJ SOLN
INTRAMUSCULAR | Status: DC | PRN
  Administered 2021-06-26: 20 mL

## 2021-06-26 NOTE — Progress Notes (Signed)
Patient ID: David [name], male   DOB: 05-23-1941, 80 y.o.   MRN: [id number]  S: Seen in room. Remains lethargic, but denies cp, sob. Graft clotted yesterday and unable to dialyze. IR to place temp cath today.    O:BP (!) 144/89   Pulse 98   Temp 98 F (36.7 C) (Oral)   Resp 20   Ht 6\' 2"  (1.88 m)   Wt 81.2 kg   SpO2 100%   BMI 22.98 kg/m   Intake/Output Summary (Last 24 hours) at 06/26/2021 1057 Last data filed at 06/25/2021 1859 Gross per 24 hour  Intake 0 ml  Output --  Net 0 ml    Intake/Output: I/O last 3 completed shifts: In: 580 [P.O.:480; IV Piggyback:100] Out: 1 [Stool:1]  Intake/Output this shift:  No intake/output data recorded. Weight change:  Gen:NAD, more awake and alert today. CVS: Irregular, no rub Resp: cta Abd: +BS, soft, NT/Nd Ext: no edema,  RUE AVG -T/B  Recent Labs  Lab 06/19/2021 1700 06/27/2021 2105 06/21/21 0814 06/21/21 1136 06/21/21 1742 06/22/21 0235 06/23/21 0349 06/24/21 0310 06/25/21 0247 06/26/21 0343  NA 137  --  137 137 138 138 139 137 136 138  K 5.4*  --  5.7* 5.4* 5.1 5.3* 5.2* 4.1 3.9 5.1  CL 93*  --  94*  --  94* 93* 94* 94* 92* 91*  CO2 27  --  28  --  26 27 22 26 24  21*  GLUCOSE 114*  --  99  --  94 126* 120* 123* 110* 143*  BUN 56*  --  75*  --  44* 44* 74* 65* 87* 107*  CREATININE 9.26*   < > 10.68*  --  7.23* 7.51* 9.33* 7.75* 9.60* 11.44*  ALBUMIN 3.2*  --  2.9*  --  3.5 3.1* 2.7* 2.6* 2.4* 2.6*  CALCIUM 8.9  --  8.9  --  9.5 9.6 9.5 9.1 9.3 10.1  PHOS 8.5*  --  10.3*  --  7.7* 8.2* 10.4* 7.7* 9.0* >12.0*  AST 29  --  24  --  42* 51*  --   --   --   --   ALT 7  --  6  --  12 13  --   --   --   --    < > = values in this interval not displayed.    Liver Function Tests: Recent Labs  Lab 06/21/21 0814 06/21/21 1742 06/22/21 0235 06/23/21 0349 06/24/21 0310 06/25/21 0247 06/26/21 0343  AST 24 42* 51*  --   --   --   --   ALT 6 12 13   --   --   --   --   ALKPHOS 105 126 122  --   --   --   --   BILITOT  1.1 1.7* 1.0  --   --   --   --   PROT 6.4* 8.2* 7.6  --   --   --   --   ALBUMIN 2.9* 3.5 3.1*   < > 2.6* 2.4* 2.6*   < > = values in this interval not displayed.    No results for input(s): LIPASE, AMYLASE in the last 168 hours. Recent Labs  Lab 06/19/2021 1700  AMMONIA 31    CBC: Recent Labs  Lab 06/21/21 0814 06/21/21 1136 06/21/21 1843 06/22/21 0235 06/23/21 0349 06/24/21 0310 06/25/21 0247 06/26/21 0343  WBC 10.3  --  17.4* 16.6* 13.1* 9.7 11.2* 18.2*  NEUTROABS 7.9*  --  15.2* 13.4*  --   --   --   --   HGB 9.8*   < > 12.1* 12.2* 11.1* 9.6* 9.6* 11.4*  HCT 29.6*   < > 37.8* 36.5* 32.1* 28.4* 28.6* 33.7*  MCV 108.4*  --  110.5* 105.5* 102.9* 102.2* 102.5* 100.9*  PLT 260  --  214 272 276 276 263 346   < > = values in this interval not displayed.    Cardiac Enzymes: No results for input(s): CKTOTAL, CKMB, CKMBINDEX, TROPONINI in the last 168 hours. CBG: Recent Labs  Lab 06/21/2021 1529 07/03/2021 2212 06/21/21 0831 06/26/21 0832  GLUCAP 116* 111* 110* 131*     Iron Studies: No results for input(s): IRON, TIBC, TRANSFERRIN, FERRITIN in the last 72 hours. Studies/Results: No results found.  (feeding supplement) PROSource Plus  30 mL Oral TID BM   allopurinol  100 mg Oral Once per day on Mon Tue Thu Sat   amiodarone  200 mg Oral Daily   apixaban  5 mg Oral BID   bictegravir-emtricitabine-tenofovir AF  1 tablet Oral Daily   brimonidine  1 drop Right Eye BID   Chlorhexidine Gluconate Cloth  6 each Topical Q0600   midodrine  10 mg Oral 2 times per day on Tue Thu Sat   midodrine  10 mg Oral Once per day on Sun Mon Wed Fri   montelukast  10 mg Oral QHS   multivitamin  1 tablet Oral QPM   ofloxacin  1 drop Right Eye QID   pantoprazole  40 mg Oral Daily   sucroferric oxyhydroxide  500 mg Oral TID with meals    BMET    Component Value Date/Time   NA 138 06/26/2021 0343   NA 142 04/24/2018 1641   K 5.1 06/26/2021 0343   CL 91 (L) 06/26/2021 0343   CO2 21 (L)  06/26/2021 0343   GLUCOSE 143 (H) 06/26/2021 0343   BUN 107 (H) 06/26/2021 0343   BUN 31 (H) 04/24/2018 1641   CREATININE 11.44 (H) 06/26/2021 0343   CREATININE 7.71 (H) 03/19/2019 1140   CALCIUM 10.1 06/26/2021 0343   CALCIUM 8.6 03/14/2016 0925   GFRNONAA 4 (L) 06/26/2021 0343   GFRNONAA 6 (L) 03/19/2019 1140   GFRAA 8 (L) 05/18/2020 0614   GFRAA 7 (L) 03/19/2019 1140   CBC    Component Value Date/Time   WBC 18.2 (H) 06/26/2021 0343   RBC 3.34 (L) 06/26/2021 0343   HGB 11.4 (L) 06/26/2021 0343   HGB 11.1 (L) 03/06/2013 1138   HCT 33.7 (L) 06/26/2021 0343   HCT 34.3 (L) 03/12/2019 0358   HCT 33.8 (L) 03/06/2013 1138   PLT 346 06/26/2021 0343   PLT 177 03/06/2013 1138   MCV 100.9 (H) 06/26/2021 0343   MCV 101.4 (A) 04/24/2018 1742   MCV 95 03/06/2013 1138   MCH 34.1 (H) 06/26/2021 0343   MCHC 33.8 06/26/2021 0343   RDW 15.0 06/26/2021 0343   RDW 13.4 03/06/2013 1138   LYMPHSABS 0.8 06/22/2021 0235   LYMPHSABS 1.5 03/06/2013 1138   MONOABS 2.3 (H) 06/22/2021 0235   EOSABS 0.0 06/22/2021 0235   EOSABS 0.3 03/06/2013 1138   BASOSABS 0.0 06/22/2021 0235   BASOSABS 0.0 03/06/2013 1138   Dialysis Orders:  MTTS at [facility] 4hr, 400/A1.5, EDW 82kg, 2K/2Ca, UFP #2, AVG, heparin 2000 bolus - Hectoral 13mcg IV q HD - Venofer 50mg  IV weekly - Mircera 32mcg IV q 2 weeks   Assessment/Plan:  Lethargy/weakness/UTI: CT/MRI without acute findings. Urine culture + Acinetobacter. On Unasyn. Per primary.     ESRD:  Will plan to keep on his MTThS schedule. If any component of over-medication contributing to current state, hopefully will slightly improve with dialysis. No significant improvement with dialysis.  AVG clotted 7/23. IR to place temp cath to allow for dialysis today. Appreciate IR assistance.  Hyperkalemia -  K+ 5.1 --Will correct with dialysis today.   Hypotension/volume: Chronically low BP (on midodrine) and with ^ BNP - UF as tolerated.  below his edw and unable to tolerate UF  with HD Thursday due to low bp.  Continue with midodrine and UF as tolerated.  Appears euvolemic.   Anemia: Hgb 11.4 No ESA needs currently.   Metabolic bone disease: Ca ok, Phos very high - unclear why. Has Velphoro binder ordered -increased dose today. NPO for procedure today. Follow when he resumes diet.    Nutrition:  Alb very low. Getting prot supplement   HFrEF: EF 20-25%  Cards has signed off. UF as tolerated.   HIV: Continue home meds.  A-fib: On Eliquis, amiodarone.   [name] PA-C [facility] 06/26/2021,10:57 AM

## 2021-06-26 NOTE — Consult Note (Addendum)
Neurology Consultation Reason for Consult: altered mental status  Requesting Physician: [email or web address]  CC: altered mentation  History is obtained from:chart. Pt unable to provide significant history  HPI: David [name] is a 80 y.o. male with complex medical history, including ESRD on HD TTS, HIV, nonischemic cardiomyopathy, hypertension, paroxysmal A. fib on Eliquis, recent admission (06/10/21) for right hip fracture post repair,   gout, chronic hypotension, GERD who presented to [facility] ED from hemodialysis due to generalized weakness and nausea.  Work-up in the ED revealed electrolyte disturbances, elevated BNP greater than 4000, mild pulmonary edema with concern for acute CHF, afib with RVR,and elevated troponin with TRH.   Neurology was consulted due to persistent lethargy. Neuroimaging obtained: Head CT done showed no acute intracranial findings but did show chronic microvascular ischemic changes and cerebral volume loss.  MRI showed no acute intracranial abnormality but same generalized volume loss and chronic microvascular ischemia.   Patient was recently discharged from hospital into short term rehab for subcapital hip fracture repair. Palliative medicine team has been involved for outpatient services as extra layer of support.    LKW: unclear tPA given?: No     ROS: All other review of systems was negative except as noted in the HPI.     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   Acute on chronic systolic and diastolic heart failure, NYHA class 4 (HCC)    Anemia, iron deficiency 11/15/2011   Arthritis    "hands, right knee, feet" (02/21/2018)   Atrial fibrillation (HCC)    Cardiac arrest ([place]) 02/17/2018   CHF (congestive heart failure) (HCC)    Chronic combined systolic and diastolic heart failure, NYHA class 3 ([place]) 06/2010   felt to be secondary to hypertensive cardiomyopathy   Chronic lower back pain    CKD (chronic kidney disease) stage V  requiring chronic dialysis ([place])    COVID-19 12/2019   ESRD on dialysis [facility])    started 02/2018 [place]   GERD (gastroesophageal reflux disease)    Glaucoma    "I had surgery and dont have it any more"   Gout    daily RX (02/21/2018)   Heart murmur    "mild" per pt   Hepatitis    years ago   HIV (human immunodeficiency virus infection) ([place])    HIV infection ([place])    Hyperlipidemia    Hypertension    Nonischemic cardiomyopathy ([place])    Pneumonia 11/2017   Prostate cancer ([place])    hx of prostate; s/p radioactive seed implant 10/2009 Dr Janice Norrie   Sinus bradycardia    Sleep apnea    does not use a cpap   Stroke ([place])    "mini stroke" years ago   Wears glasses       Family History  Problem Relation Age of Onset   Hypertension Mother    Thyroid disease Mother    Cholelithiasis Daughter    Cholelithiasis Son    Hypertension Maternal Grandmother    Diabetes Maternal Grandmother    Heart attack Neg Hx    Hyperlipidemia Neg Hx       Social History:  reports that he quit smoking about 16 years ago. His smoking use included cigarettes. He has a 30.00 pack-year smoking history. He has never used smokeless tobacco. He reports current alcohol use of about 1.0 standard drink of alcohol per week. He reports that he does not use drugs.    Exam: Current vital  signs: BP (!) 144/89   Pulse 98   Temp 98 F (36.7 C) (Oral)   Resp 20   Ht 6\' 2"  (1.88 m)   Wt 81.2 kg   SpO2 100%   BMI 22.98 kg/m  Vital signs in last 24 hours: Temp:  [98 F (36.7 C)-98.9 F (37.2 C)] 98 F (36.7 C) (07/24 0835) Pulse Rate:  [98] 98 (07/23 2120) Resp:  [20-24] 20 (07/24 0835) BP: (140-169)/(86-104) 144/89 (07/24 0835) SpO2:  [100 %] 100 % (07/24 0835)   Physical Exam  Constitutional: Appears chronically ill with paucity of words. Wife at bedside providing support.  Psych: pleasant and attempts to participate in exam and discussion Eyes: No scleral  injection HENT: No oropharyngeal obstruction.  MSK: no joint deformities.  Cardiovascular: Normal rate and regular rhythm.  Respiratory: Effort normal, non-labored breathing GI: Soft.  No distension. There is no tenderness.  Skin: Warm dry and intact visible skin  Neuro: Mental Status: Patient is awake, alert, oriented to person, place,  year, and situation.  Patient is not able to give a clear and coherent history.  No signs of aphasia or neglect  Cranial Nerves: II: Visual Fields are full. Pupils are equal, round, and reactive to light.    III,IV, VI: EOMI without ptosis or diploplia.  V: Facial sensation is symmetric to temperature VII: Facial movement is symmetric.  VIII: hearing is intact to voice X: Uvula elevates symmetrically XI: Shoulder shrug is symmetric. XII: tongue is midline without atrophy or fasciculations.  Motor: Tone is decreased globally  Bulk is normal. All extremities slowly fall to bed to exam before 5 count.  Sensory: Sensation appears symmetric to light touch and temperature in the arms and legs.  Deep Tendon Reflexes: 2+ and symmetric in the biceps and patellae.     Cerebellar: Unable to assess d/t pt status.   I have reviewed labs in epic and the results pertinent to this consultation are:  Results for David [name], David [name] (MRN [id number]) as of 06/26/2021 14:06  Ref. Range 06/26/2021 03:43  Potassium Latest Ref Range: 3.5 - 5.1 mmol/L 5.1  Chloride Latest Ref Range: 98 - 111 mmol/L 91 (L)  CO2 Latest Ref Range: 22 - 32 mmol/L 21 (L)  Glucose Latest Ref Range: 70 - 99 mg/dL 143 (H)  BUN Latest Ref Range: 8 - 23 mg/dL 107 (H)  Creatinine Latest Ref Range: 0.61 - 1.24 mg/dL 11.44 (H)  Calcium Latest Ref Range: 8.9 - 10.3 mg/dL 10.1  Anion gap Latest Ref Range: 5 - 15  26 (H)  Phosphorus Latest Ref Range: 2.5 - 4.6 mg/dL >12.0 (H)  Albumin Latest Ref Range: 3.5 - 5.0 g/dL 2.6 (L)  GFR, Estimated Latest Ref Range: >60 mL/min 4 (L)  WBC Latest Ref  Range: 4.0 - 10.5 K/uL 18.2 (H)  RBC Latest Ref Range: 4.22 - 5.81 MIL/uL 3.34 (L)  Hemoglobin Latest Ref Range: 13.0 - 17.0 g/dL 11.4 (L)  HCT Latest Ref Range: 39.0 - 52.0 % 33.7 (L)  MCV Latest Ref Range: 80.0 - 100.0 fL 100.9 (H)  MCH Latest Ref Range: 26.0 - 34.0 pg 34.1 (H)  MCHC Latest Ref Range: 30.0 - 36.0 g/dL 33.8  RDW Latest Ref Range: 11.5 - 15.5 % 15.0  Platelets Latest Ref Range: 150 - 400 K/uL 346  Results for David [name], David [name] (MRN [id number]) as of 06/26/2021 14:06  Ref. Range 06/16/2021 17:00  B Natriuretic Peptide Latest Ref Range: 0.0 - 100.0 pg/mL 4,128.9 (H)  Results  for David [name], David [name] (MRN [id number]) as of 06/26/2021 14:06  Ref. Range 06/21/2021 22:21  Appearance Latest Ref Range: CLEAR  HAZY (A)  Bilirubin Urine Latest Ref Range: NEGATIVE  NEGATIVE  Color, Urine Latest Ref Range: YELLOW  YELLOW  Glucose, UA Latest Ref Range: NEGATIVE mg/dL 50 (A)  Hgb urine dipstick Latest Ref Range: NEGATIVE  SMALL (A)  Ketones, ur Latest Ref Range: NEGATIVE mg/dL NEGATIVE  Leukocytes,Ua Latest Ref Range: NEGATIVE  LARGE (A)  Nitrite Latest Ref Range: NEGATIVE  NEGATIVE  pH Latest Ref Range: 5.0 - 8.0  9.0 (H)  Protein Latest Ref Range: NEGATIVE mg/dL 100 (A)  Specific Gravity, Urine Latest Ref Range: 1.005 - 1.030  1.011  Bacteria, UA Latest Ref Range: NONE SEEN  RARE (A)  Mucus Unknown PRESENT  RBC / HPF Latest Ref Range: 0 - 5 RBC/hpf 11-20  Squamous Epithelial / LPF Latest Ref Range: 0 - 5  0-5  WBC, UA Latest Ref Range: 0 - 5 WBC/hpf >50 (H)    I have reviewed the images obtained: CT HEAD 1. No acute intracranial findings. 2. Chronic microvascular ischemic change and cerebral volume loss.  rEEG 7/20: mild-to-moderate diffuse slowing  MRI BRAIN   1. No acute intracranial abnormality. 2. Generalized volume loss and findings of chronic microvascular ischemia.  Impression: 80 year old black male with history outlined above who is admitted with  Generalized  weakness. Neuroimaging is so far unremarkable. Patient has significant medical comorbidity that may contribute to his encephalopathic appearance, including missed dialysis, UTI with acinetobacter, sequelae from HIV, mild cognitive impairment, and  chronic hypotension.   Recommendations: No further neurologic workup is recommended prior to correction of acute medical issues. Palliative care is consulting which I agree is appropriate  Neurology will not continue to actively follow. If encephalopathy remains after correction of metabolic issues, we would consider LP but patient would need to be off anticoagulation. Re-consult as needed.  I spoke with patient and wife regarding above. All questions answered   Claiborne Billings NP  Neurology Attending Attestation   I examined the patient and discussed plan with Ms. Cecile Hearing NP. Above note has been edited by me to reflect my findings and recommendations.    Su Monks, MD [facility] [phone]   If 7pm- 7am, please page neurology on call as listed in [place].

## 2021-06-26 NOTE — Progress Notes (Signed)
ANTICOAGULATION CONSULT NOTE - Initial Consult  Pharmacy Consult for Heparin  Indication: atrial fibrillation  Allergies  Allergen Reactions   Dextromethorphan-Guaifenesin Other (See Comments)    Unknown reaction   Tocotrienols Other (See Comments)    Unknown reaction   Losartan Potassium Other (See Comments)    Causes constipation    Patient Measurements: Height: 6\' 2"  (188 cm) Weight: 81.2 kg (179 lb 0.2 oz) IBW/kg (Calculated) : 82.2 Heparin Dosing Weight: 81 kh  Vital Signs: Temp: 98 F (36.7 C) (07/24 0835) Temp Source: Oral (07/24 0835) BP: 144/89 (07/24 0835)  Labs: Recent Labs    06/23/21 2237 06/24/21 0310 06/24/21 0310 06/25/21 0247 06/26/21 0343  HGB  --  9.6*   < > 9.6* 11.4*  HCT  --  28.4*  --  28.6* 33.7*  PLT  --  276  --  263 346  APTT 80* 67*  --   --   --   HEPARINUNFRC  --  1.08*  --   --   --   CREATININE  --  7.75*  --  9.60* 11.44*   < > = values in this interval not displayed.    Estimated Creatinine Clearance: 6 mL/min (A) (by C-G formula based on SCr of 11.44 mg/dL (H)).   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[place])    COVID-19 12/2019   ESRD on dialysis ([place])    started 02/2018 [place]   GERD (gastroesophageal reflux disease)    Glaucoma    "I had surgery and dont have it any more"   Gout    daily RX (02/21/2018)   Heart murmur    "mild" per pt    Hepatitis    years ago   HIV (human immunodeficiency virus infection) ([place])    HIV infection ([place])    Hyperlipidemia    Hypertension    Nonischemic cardiomyopathy ([place])    Pneumonia 11/2017   Prostate cancer ([place])    hx of prostate; s/p radioactive seed implant 10/2009 Dr Janice Norrie   Sinus bradycardia    Sleep apnea    does not use a cpap   Stroke ([place])    "mini stroke" years ago   Wears glasses     Medications:  Infusions:   ampicillin-sulbactam (UNASYN) IV 3 g (06/26/21 1822)    Assessment: 80 YOM on Apixaban PTA for hx Afib - now unable to take po and pharmacy requested to bridge with Heparin.   Last Apixaban dose was 7/23 AM. The patient was previously therapeutic on a heparin rate of 2000 units/hr.   Goal of Therapy:  Heparin level 0.3-0.7 units/ml aPTT 66-102 seconds Monitor platelets by anticoagulation protocol: Yes   Plan:  - Restart Heparin at a rate of 2000 units/hr (20 ml/hr) - Daily HL/aPTT, CBC  - Will continue to monitor for any signs/symptoms of bleeding and will follow up with aPTT and heparin  level in 8 hours   Thank you for allowing pharmacy to be a part of this patient's care.  Alycia Rossetti, PharmD, BCPS Clinical Pharmacist Clinical phone for 06/26/2021: [phone] 06/26/2021 8:34 PM   **Pharmacist phone directory can now be found on [email or web address] ([email or web address]).  Listed under [email or web address].

## 2021-06-26 NOTE — Progress Notes (Signed)
PROGRESS NOTE    David [name]  QAS:[id number] DOB: 1941-06-27 DOA: 06/08/2021 PCP: [name], NP   Brief Narrative:  80 year old chronically ill-appearing male with a past medical history significant for but not limited to ESRD on hemodialysis M/T/T/S, Afib on [facility], history of gout, chronic hypotension, GERD, recent admission for right hip fracture status post repair presented from his SNF for generalized weakness and nausea.  Work-up in the ED revealed electrolyte disturbances, elevated BNP greater than 4000, mild pulmonary edema with concern for acute CHF and elevated troponin.  TRH was asked to admit this patient but patient was unable to provide a subjective history due to his lethargy.  He underwent head imaging with a head CT scan as well as a MRI.  Head CT done showed no acute intracranial findings but did show chronic microvascular ischemic changes and cerebral volume loss.  MRI done showed no acute intracranial abnormality and showed grade generalized volume loss and findings of chronic microvascular ischemia. On 7/19, after HD, was found to be in A. fib with RVR, cardiology consulted.  Patient admitted for further management.    Assessment & Plan:   Active Problems:   ESRD (end stage renal disease) on dialysis (HCC)   Acute on chronic systolic CHF (congestive heart failure), NYHA class 4 (HCC)   Atrial fibrillation with RVR (HCC)   Generalized weakness   A-fib (HCC)   Possible acute metabolic encephalopathy  Possible UTI Noted 100.8 fever on 06/21/2021, currently afebrile with leukocytosis UA with large leukocytes, greater than 50 WBC UC growing >100,000 Acinetobacter calcoaceticus/baumannii complex, start Unasyn, stop ceftriaxone BC x2 NGTD Chest x-ray unremarkable for any infection Procalcitonin elevated at 29 Head CT and MRI brain done showed no acute intracranial findings but did show chronic microvascular ischemic changes and cerebral volume loss.   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 fib on Eliquis Currently in A. 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 Hemoglobin is stable No overt bleeding Iron supplementation by nephrology Daily CBC  Goals of care discussion Patient with very poor prognosis, multiple comorbidities Palliative care consulted     DVT prophylaxis: Eliquis Code Status: FULL CODE  Family Communication: Discussed with Wife at bedside on 06/22/2021 Disposition Plan: Pending further clinical improvement   Status is: Inpatient  The patient will require care spanning > 2 midnights and should be moved to inpatient because: Unsafe d/c plan, IV treatments appropriate due to intensity of illness  or inability to take PO, and Inpatient level of care appropriate due to severity of illness  Dispo:  Patient  From: [facility] Disposition: [place]  Medically stable for discharge: No       Consultants:  Cardiology Nephrology   Procedures: ECHOCARDIOGRAM IMPRESSIONS     1. Left ventricular ejection fraction, by estimation, is 20 to 25%. The  left ventricle has severely decreased function. The left ventricle  demonstrates global hypokinesis. The left ventricular internal cavity size  was severely dilated. Left ventricular  diastolic parameters are consistent with Grade II diastolic dysfunction  (pseudonormalization). Elevated left atrial pressure.   2. Right ventricular systolic function is mildly reduced. The right  ventricular size is severely enlarged. There is normal pulmonary artery  systolic pressure. The estimated right ventricular systolic pressure is  40.8 mmHg.   3. Left atrial size was moderately dilated.   4. Right atrial size was mildly dilated.   5. The mitral valve is normal in structure. Severe mitral valve  regurgitation.   6. The aortic valve is tricuspid. Aortic valve regurgitation is mild to  moderate. Mild to moderate aortic valve stenosis.   7. There is mild dilatation of the ascending aorta, measuring 42 mm.   8. The inferior vena cava is dilated in size with >50% respiratory  variability, suggesting right atrial pressure of 8 mmHg.   Comparison(s): Prior images reviewed side by side. The left ventricular  function is unchanged. The right ventricular systolic function is worse.  Mitral regurgitation appears more severe. There is systolic flow reversal  in the pulmonary veins. In a  hemodialysis patient, this may be due to different loading conditions.   FINDINGS   Left Ventricle: Left ventricular ejection fraction, by estimation, is 20  to 25%. The left ventricle has severely decreased function. The left  ventricle demonstrates global hypokinesis. The left ventricular internal  cavity size was severely dilated.  There  is no left ventricular hypertrophy. Abnormal (paradoxical) septal  motion, consistent with left bundle branch block. Left ventricular  diastolic parameters are consistent with Grade II diastolic dysfunction  (pseudonormalization). Elevated left atrial   pressure.   Right Ventricle: The right ventricular size is severely enlarged. No  increase in right ventricular wall thickness. Right ventricular systolic  function is mildly reduced. There is normal pulmonary artery systolic  pressure. The tricuspid regurgitant  velocity is 2.44 m/s, and with an assumed right atrial pressure of 8 mmHg,  the estimated right ventricular systolic pressure is 14.4 mmHg.   Left Atrium: Left atrial size was moderately dilated.   Right Atrium: Right atrial size was mildly dilated.   Pericardium: Trivial pericardial effusion is present.   Mitral Valve: There is diastolic MR due to long AV delay/first degree AV  block. The mitral valve is normal in structure. Severe mitral valve  regurgitation, with centrally-directed jet.   Tricuspid Valve: The tricuspid valve is normal in structure. Tricuspid  valve regurgitation is trivial.   Aortic Valve: The aortic valve is tricuspid. Aortic valve regurgitation is  mild to moderate. Aortic regurgitation PHT measures 359 msec. Mild to  moderate aortic stenosis is present. Aortic valve mean gradient measures  11.0 mmHg. Aortic valve peak  gradient measures 21.3 mmHg. Aortic valve area, by VTI measures 1.26 cm.   Pulmonic Valve: The pulmonic valve was grossly normal. Pulmonic valve  regurgitation is not visualized.   Aorta: The aortic root is normal in size and structure. There is mild  dilatation  of the ascending aorta, measuring 42 mm.   Venous: A pattern of systolic flow reversal, suggestive of severe mitral  regurgitation is recorded from the right upper pulmonary vein. The  inferior vena cava is dilated in size with greater than 50% respiratory  variability,  suggesting right atrial  pressure of 8 mmHg.   IAS/Shunts: No atrial level shunt detected by color flow Doppler.      LEFT VENTRICLE  PLAX 2D  LVIDd:         7.07 cm  Diastology  LVIDs:         6.20 cm  LV e' medial:    4.03 cm/s  LV PW:         1.60 cm  LV E/e' medial:  35.0  LV IVS:        1.00 cm  LV e' lateral:   6.42 cm/s  LVOT diam:     1.90 cm  LV E/e' lateral: 22.0  LV SV:         47  LV SV Index:   23  LVOT Area:     2.84 cm      RIGHT VENTRICLE             IVC  RV Basal diam:  3.80 cm     IVC diam: 2.10 cm  RV Mid diam:    2.80 cm  RV S prime:     11.60 cm/s  TAPSE (M-mode): 2.0 cm   LEFT ATRIUM             Index       RIGHT ATRIUM           Index  LA diam:        4.30 cm 2.08 cm/m  RA Area:     18.40 cm  LA Vol (A2C):   79.3 ml 38.37 ml/m RA Volume:   51.50 ml  24.92 ml/m  LA Vol (A4C):   68.8 ml 33.29 ml/m  LA Biplane Vol: 74.4 ml 36.00 ml/m   AORTIC VALVE  AV Area (Vmax):    1.29 cm  AV Area (Vmean):   1.25 cm  AV Area (VTI):     1.26 cm  AV Vmax:           231.00 cm/s  AV Vmean:          157.500 cm/s  AV VTI:            0.375 m  AV Peak Grad:      21.3 mmHg  AV Mean Grad:      11.0 mmHg  LVOT Vmax:         105.00 cm/s  LVOT Vmean:        69.200 cm/s  LVOT VTI:          0.167 m  LVOT/AV VTI ratio: 0.45  AI PHT:            359 msec     AORTA  Ao Root diam: 3.00 cm  Ao Asc diam:  4.20 cm   MITRAL VALVE                 TRICUSPID VALVE  MV Area (PHT): 3.60 cm      TR Peak grad:   23.8 mmHg  MV Decel Time: 211 msec      TR Vmax:        244.00 cm/s  MR Peak grad:    95.8 mmHg  MR Mean grad:  59.5 mmHg   SHUNTS  MR Vmax:         489.50 cm/s Systemic VTI:  0.17 m  MR Vmean:        364.0 cm/s  Systemic Diam: 1.90 cm  MR PISA:         1.01 cm  MR PISA Eff ROA: 8 mm  MR PISA Radius:  0.40 cm  MV E velocity: 141.00 cm/s  MV A velocity: 110.00 cm/s  MV E/A ratio:  1.28   Antimicrobials:  Anti-infectives (From admission, onward)    Start      Dose/Rate Route Frequency Ordered Stop   06/24/21 1800  Ampicillin-Sulbactam (UNASYN) 3 g in sodium chloride 0.9 % 100 mL IVPB        3 g 200 mL/hr over 30 Minutes Intravenous Every 12 hours 06/24/21 1617     06/21/21 2315  cefTRIAXone (ROCEPHIN) 2 g in sodium chloride 0.9 % 100 mL IVPB  Status:  Discontinued        2 g 200 mL/hr over 30 Minutes Intravenous Every 24 hours 06/21/21 2227 06/24/21 1617   06/21/21 1000  bictegravir-emtricitabine-tenofovir AF ([name]) 50-200-25 MG per tablet 1 tablet        1 tablet Oral Daily 07/03/2021 2213          Subjective: Patient still noted to be lethargic, denies any new complaints.   Objective: Vitals:   06/25/21 2120 06/25/21 2300 06/26/21 0300 06/26/21 0835  BP: 140/87 (!) 149/104 (!) 169/86 (!) 144/89  Pulse: 98     Resp: (!) 24 20 20 20   Temp: 98.9 F (37.2 C)   98 F (36.7 C)  TempSrc: Oral   Oral  SpO2: 100%   100%  Weight:      Height:        Intake/Output Summary (Last 24 hours) at 06/26/2021 1651 Last data filed at 06/25/2021 1859 Gross per 24 hour  Intake 0 ml  Output --  Net 0 ml   Filed Weights   06/23/21 2040 06/24/21 0404 06/25/21 0551  Weight: 81.4 kg 80.2 kg 81.2 kg   Examination: Physical Exam: General: NAD, very deconditioned, chronically ill-appearing, lethargic Cardiovascular: S1, S2 present Respiratory: Diminished breath sounds bilaterally Abdomen: Soft, nontender, nondistended, bowel sounds present Musculoskeletal: No bilateral pedal edema noted Skin: Normal Psychiatry: Normal mood     Data Reviewed: I have personally reviewed following labs and imaging studies  CBC: Recent Labs  Lab 06/04/2021 1700 06/27/2021 2105 06/21/21 0814 06/21/21 1136 06/21/21 1843 06/22/21 0235 06/23/21 0349 06/24/21 0310 06/25/21 0247 06/26/21 0343  WBC 9.3   < > 10.3  --  17.4* 16.6* 13.1* 9.7 11.2* 18.2*  NEUTROABS 7.4  --  7.9*  --  15.2* 13.4*  --   --   --   --   HGB 10.7*   < > 9.8*   < > 12.1* 12.2*  11.1* 9.6* 9.6* 11.4*  HCT 32.9*   < > 29.6*   < > 37.8* 36.5* 32.1* 28.4* 28.6* 33.7*  MCV 108.2*   < > 108.4*  --  110.5* 105.5* 102.9* 102.2* 102.5* 100.9*  PLT 281   < > 260  --  214 272 276 276 263 346   < > = values in this interval not displayed.   Basic Metabolic Panel: Recent Labs  Lab 06/26/2021 1700 06/19/2021 2105 06/21/21 0814 06/21/21 1136 06/21/21 1742 06/22/21 0235 06/23/21 0349 06/24/21 0310 06/25/21 0247 06/26/21 0343  NA 137  --  137   < >  138 138 139 137 136 138  K 5.4*  --  5.7*   < > 5.1 5.3* 5.2* 4.1 3.9 5.1  CL 93*  --  94*  --  94* 93* 94* 94* 92* 91*  CO2 27  --  28  --  26 27 22 26 24  21*  GLUCOSE 114*  --  99  --  94 126* 120* 123* 110* 143*  BUN 56*  --  75*  --  44* 44* 74* 65* 87* 107*  CREATININE 9.26*   < > 10.68*  --  7.23* 7.51* 9.33* 7.75* 9.60* 11.44*  CALCIUM 8.9  --  8.9  --  9.5 9.6 9.5 9.1 9.3 10.1  MG 2.6*  --  2.7*  --  2.6* 2.6*  --   --   --   --   PHOS 8.5*  --  10.3*  --  7.7* 8.2* 10.4* 7.7* 9.0* >12.0*   < > = values in this interval not displayed.   GFR: Estimated Creatinine Clearance: 6 mL/min (A) (by C-G formula based on SCr of 11.44 mg/dL (H)). Liver Function Tests: Recent Labs  Lab 06/07/2021 1700 06/21/21 0814 06/21/21 1742 06/22/21 0235 06/23/21 0349 06/24/21 0310 06/25/21 0247 06/26/21 0343  AST 29 24 42* 51*  --   --   --   --   ALT 7 6 12 13   --   --   --   --   ALKPHOS 123 105 126 122  --   --   --   --   BILITOT 1.1 1.1 1.7* 1.0  --   --   --   --   PROT 7.0 6.4* 8.2* 7.6  --   --   --   --   ALBUMIN 3.2* 2.9* 3.5 3.1* 2.7* 2.6* 2.4* 2.6*   No results for input(s): LIPASE, AMYLASE in the last 168 hours. Recent Labs  Lab 07/01/2021 1700  AMMONIA 31   Coagulation Profile: Recent Labs  Lab 07/02/2021 1700  INR 1.8*   Cardiac Enzymes: No results for input(s): CKTOTAL, CKMB, CKMBINDEX, TROPONINI in the last 168 hours. BNP (last 3 results) No results for input(s): PROBNP in the last 8760 hours. HbA1C: No  results for input(s): HGBA1C in the last 72 hours. CBG: Recent Labs  Lab 06/22/2021 1529 06/04/2021 2212 06/21/21 0831 06/26/21 0832  GLUCAP 116* 111* 110* 131*   Lipid Profile: No results for input(s): CHOL, HDL, LDLCALC, TRIG, CHOLHDL, LDLDIRECT in the last 72 hours. Thyroid Function Tests: No results for input(s): TSH, T4TOTAL, FREET4, T3FREE, THYROIDAB in the last 72 hours.  Anemia Panel: No results for input(s): VITAMINB12, FOLATE, FERRITIN, TIBC, IRON, RETICCTPCT in the last 72 hours.  Sepsis Labs: Recent Labs  Lab 06/27/2021 1700 06/19/2021 2105 06/21/21 2318 06/22/21 0235 06/23/21 0349 06/24/21 0310 06/25/21 0247  PROCALCITON  --   --   --   --  29.91 26.07 22.80  LATICACIDVEN 1.8 1.6 1.8 1.7  --   --   --     Recent Results (from the past 240 hour(s))  Resp Panel by RT-PCR (Flu A&B, Covid) Nasopharyngeal Swab     Status: None   Collection Time: 06/16/21  5:04 PM   Specimen: Nasopharyngeal Swab; Nasopharyngeal(NP) swabs in vial transport medium  Result Value Ref Range Status   SARS Coronavirus 2 by RT PCR NEGATIVE NEGATIVE Final    Comment: (NOTE) SARS-CoV-2 target nucleic acids are NOT DETECTED.  The SARS-CoV-2 RNA is generally detectable in upper  respiratory specimens during the acute phase of infection. The lowest concentration of SARS-CoV-2 viral copies this assay can detect is 138 copies/mL. A negative result does not preclude SARS-Cov-2 infection and should not be used as the sole basis for treatment or other patient management decisions. A negative result may occur with  improper specimen collection/handling, submission of specimen other than nasopharyngeal swab, presence of viral mutation(s) within the areas targeted by this assay, and inadequate number of viral copies(<138 copies/mL). A negative result must be combined with clinical observations, patient history, and epidemiological information. The expected result is Negative.  Fact Sheet for Patients:   EntrepreneurPulse.com.au  Fact Sheet for Healthcare Providers:  IncredibleEmployment.be  This test is no t yet approved or cleared by the [place] FDA and  has been authorized for detection and/or diagnosis of SARS-CoV-2 by FDA under an Emergency Use Authorization (EUA). This EUA will remain  in effect (meaning this test can be used) for the duration of the COVID-19 declaration under Section 564(b)(1) of the Act, 21 U.S.C.section 360bbb-3(b)(1), unless the authorization is terminated  or revoked sooner.       Influenza A by PCR NEGATIVE NEGATIVE Final   Influenza B by PCR NEGATIVE NEGATIVE Final    Comment: (NOTE) The Xpert Xpress SARS-CoV-2/FLU/RSV plus assay is intended as an aid in the diagnosis of influenza from Nasopharyngeal swab specimens and should not be used as a sole basis for treatment. Nasal washings and aspirates are unacceptable for Xpert Xpress SARS-CoV-2/FLU/RSV testing.  Fact Sheet for Patients: EntrepreneurPulse.com.au  Fact Sheet for Healthcare Providers: IncredibleEmployment.be  This test is not yet approved or cleared by the [place] FDA and has been authorized for detection and/or diagnosis of SARS-CoV-2 by FDA under an Emergency Use Authorization (EUA). This EUA will remain in effect (meaning this test can be used) for the duration of the COVID-19 declaration under Section 564(b)(1) of the Act, 21 U.S.C. section 360bbb-3(b)(1), unless the authorization is terminated or revoked.  Performed at [facility], [place] [address] [place], [place] [address]   Blood culture (routine x 2)     Status: None   Collection Time: 07/01/2021  3:53 PM   Specimen: BLOOD  Result Value Ref Range Status   Specimen Description BLOOD SITE NOT SPECIFIED  Final   Special Requests   Final    BOTTLES DRAWN AEROBIC AND ANAEROBIC Blood Culture results may not be optimal due to an excessive  volume of blood received in culture bottles IV START   Culture   Final    NO GROWTH 5 DAYS Performed at [facility], [place] [address], [place],  [address]    Report Status 06/25/2021 FINAL  Final  SARS CORONAVIRUS 2 (TAT 6-24 HRS) Nasopharyngeal Nasopharyngeal Swab     Status: None   Collection Time: 07/03/2021  4:50 PM   Specimen: Nasopharyngeal Swab  Result Value Ref Range Status   SARS Coronavirus 2 NEGATIVE NEGATIVE Final    Comment: (NOTE) SARS-CoV-2 target nucleic acids are NOT DETECTED.  The SARS-CoV-2 RNA is generally detectable in upper and lower respiratory specimens during the acute phase of infection. Negative results do not preclude SARS-CoV-2 infection, do not rule out co-infections with other pathogens, and should not be used as the sole basis for treatment or other patient management decisions. Negative results must be combined with clinical observations, patient history, and epidemiological information. The expected result is Negative.  Fact Sheet for Patients: SugarRoll.be  Fact Sheet for Healthcare Providers: https://www.woods-mathews.com/  This test is not yet approved or cleared by the [place] and  has been authorized for detection and/or diagnosis of SARS-CoV-2 by FDA under an Emergency Use Authorization (EUA). This EUA will remain  in effect (meaning this test can be used) for the duration of the COVID-19 declaration under Se ction 564(b)(1) of the Act, 21 U.S.C. section 360bbb-3(b)(1), unless the authorization is terminated or revoked sooner.  Performed at [facility], [place] [address], [place], [place] [address]   Blood culture (routine x 2)     Status: None   Collection Time: 06/09/2021  9:08 PM   Specimen: BLOOD  Result Value Ref Range Status   Specimen Description BLOOD LEFT HAND  Final   Special Requests   Final    BOTTLES DRAWN AEROBIC AND ANAEROBIC Blood Culture adequate volume    Culture   Final    NO GROWTH 5 DAYS Performed at [facility], [place] [address] [place], [place] [address]    Report Status 06/25/2021 FINAL  Final  Culture, blood (routine x 2)     Status: None (Preliminary result)   Collection Time: 06/21/21 11:07 PM   Specimen: BLOOD LEFT HAND  Result Value Ref Range Status   Specimen Description BLOOD LEFT HAND  Final   Special Requests   Final    BOTTLES DRAWN AEROBIC ONLY Blood Culture results may not be optimal due to an inadequate volume of blood received in culture bottles   Culture   Final    NO GROWTH 4 DAYS Performed at [facility], [place] [address], [place], [place] [address]    Report Status PENDING  Incomplete  Culture, blood (routine x 2)     Status: None (Preliminary result)   Collection Time: 06/21/21 11:14 PM   Specimen: BLOOD  Result Value Ref Range Status   Specimen Description BLOOD LEFT THUMB  Final   Special Requests   Final    BOTTLES DRAWN AEROBIC ONLY Blood Culture results may not be optimal due to an inadequate volume of blood received in culture bottles   Culture   Final    NO GROWTH 4 DAYS Performed at [facility], [place] [address], [place], [place] [address]    Report Status PENDING  Incomplete  Urine Culture     Status: Abnormal   Collection Time: 06/22/21 10:02 AM   Specimen: Urine, Clean Catch  Result Value Ref Range Status   Specimen Description URINE, CLEAN CATCH  Final   Special Requests   Final    NONE Performed at [facility], [place] [address] [place], [name] [address]    Culture (A)  Final    >=100,000 COLONIES/mL ACINETOBACTER CALCOACETICUS/BAUMANNII COMPLEX   Report Status 06/24/2021 FINAL  Final   Organism ID, Bacteria ACINETOBACTER CALCOACETICUS/BAUMANNII COMPLEX (A)  Final      Susceptibility   Acinetobacter calcoaceticus/baumannii complex - MIC*    CEFTAZIDIME 8 SENSITIVE Sensitive     CIPROFLOXACIN 0.5 SENSITIVE Sensitive     GENTAMICIN <=1 SENSITIVE Sensitive      IMIPENEM <=0.25 SENSITIVE Sensitive     PIP/TAZO <=4 SENSITIVE Sensitive     TRIMETH/SULFA <=20 SENSITIVE Sensitive     AMPICILLIN/SULBACTAM <=2 SENSITIVE Sensitive     * >=100,000 COLONIES/mL ACINETOBACTER CALCOACETICUS/BAUMANNII COMPLEX    RN Pressure Injury Documentation:     Estimated body mass index is 22.98 kg/m as calculated from the following:   Height as of this encounter: 6\' 2"  (  1.88 m).   Weight as of this encounter: 81.2 kg.  Malnutrition Type: Nutrition Problem: Increased nutrient needs Etiology: chronic illness (ESRD on HD) Malnutrition Characteristics: Signs/Symptoms: estimated needs Nutrition Interventions: Interventions: MVI, Prostat, Magic cup  Radiology Studies: IR Veno/Jugular Left  Result Date: 06/26/2021 INDICATION: Occluded dialysis access and need for temporary non tunneled dialysis catheter for immediate dialysis needs. EXAM: 1. ULTRASOUND GUIDANCE FOR VASCULAR ACCESS OF THE LEFT INTERNAL JUGULAR VEIN 2. LEFT JUGULAR VENOGRAPHY 3. ULTRASOUND GUIDANCE FOR VASCULAR ACCESS OF THE LEFT COMMON FEMORAL VEIN 4. PLACEMENT OF NON TUNNELED CENTRAL VENOUS CATHETER VIA LEFT COMMON FEMORAL VEIN WITH FLUOROSCOPIC GUIDANCE MEDICATIONS: None ANESTHESIA/SEDATION: None FLUOROSCOPY TIME:  Fluoroscopy Time: 2 minutes and 30 seconds. 43.0 mGy. CONTRAST:  10 mL Omnipaque [id number] COMPLICATIONS: None immediate. PROCEDURE: Informed written consent was obtained from the patient after a thorough discussion of the procedural risks, benefits and alternatives. All questions were addressed. Maximal Sterile Barrier Technique was utilized including caps, mask, sterile gowns, sterile gloves, sterile drape, hand hygiene and skin antiseptic. A timeout was performed prior to the initiation of the procedure. Initial ultrasound was performed of both right and left neck regions to evaluate patency of the jugular veins. Patency of the left internal jugular vein was confirmed by ultrasound. The left neck was  sterilely prepped with chlorhexidine. Local anesthesia was provided with 1% lidocaine. Under direct ultrasound guidance, the left internal jugular vein was punctured with a 21 gauge needle. A micro-access dilator was advanced over a guidewire. A 5 French catheter was then advanced over a guidewire to the level of the central veins. Contrast venography was performed through the 5 French catheter positioned at the level of the left brachiocephalic vein. The catheter was then removed and hemostasis obtained with manual compression over the jugular puncture site. Ultrasound was performed of both groin regions. Patency of the left common femoral vein was confirmed by ultrasound. The left groin was prepped with chlorhexidine. Local anesthesia was provided with 1% lidocaine. Access of the left common femoral vein was performed under direct ultrasound guidance with a 21 gauge needle and micropuncture set. Venous access was dilated. A 24 cm, 13 cm triple-lumen Mahurkar catheter was then advanced over a guidewire under fluoroscopy. Catheter position was confirmed by a fluoroscopic image. All 3 lumens were flushed with saline. Larger bore lumens were then locked with heparin walls. The catheter was secured at the skin with Prolene retention sutures. FINDINGS: By ultrasound, the right internal jugular vein is abnormal and tapers to the point of severe stenosis/occlusion in the lower neck. There are multiple collateral veins visualized in the neck which communicate with the external jugular vein. The external jugular vein is tortuous and can be seen entering the medial subclavian vein. Course of the external jugular vein was not favorable for non tunneled dialysis catheter placement. The left internal jugular vein is patent in the neck and demonstrates wall thickening and internal web formation consistent with prior access and potential prior partial thrombosis. After access of the vein, a guidewire would not advance across the  midline of the chest roughly at the level of the mid left brachiocephalic vein. 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 Catheter placement via the left jugular vein was therefore abandoned. There are old dialysis grafts in both proximal thighs. There is calcified and  shadowing subcutaneous graft material in both proximal thighs that affects sonographic visualization of the femoral veins. The left common femoral vein was better visualized compared to the right. After access of a patent left common femoral vein, a non tunneled temporary dialysis catheter was able to be advanced with the catheter tip located in the left common iliac vein nearly at the iliac vein confluence/lower IVC. This catheter is ready for immediate use. IMPRESSION: 1. 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 Course of the right external jugular vein was not favorable for catheter placement. 2. 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 3. Successful placement of non tunneled temporary dialysis catheter via the left common femoral vein with the catheter tip located in the left common iliac vein nearly at the level of the iliac venous confluence. Electronically Signed   By: Aletta Edouard M.D.   On: 06/26/2021 13:45   IR Fluoro Guide CV Line Left  Result Date: 06/26/2021 INDICATION: Occluded dialysis access and need for temporary non tunneled dialysis catheter for immediate dialysis needs. EXAM: 1. ULTRASOUND GUIDANCE FOR VASCULAR ACCESS OF THE LEFT INTERNAL JUGULAR VEIN 2. LEFT JUGULAR VENOGRAPHY 3. ULTRASOUND GUIDANCE FOR VASCULAR ACCESS OF THE LEFT  COMMON FEMORAL VEIN 4. PLACEMENT OF NON TUNNELED CENTRAL VENOUS CATHETER VIA LEFT COMMON FEMORAL VEIN WITH FLUOROSCOPIC GUIDANCE MEDICATIONS: None ANESTHESIA/SEDATION: None FLUOROSCOPY TIME:  Fluoroscopy Time: 2 minutes and 30 seconds. 43.0 mGy. CONTRAST:  10 mL Omnipaque [id number] COMPLICATIONS: None immediate. PROCEDURE: Informed written consent was obtained from the patient after a thorough discussion of the procedural risks, benefits and alternatives. All questions were addressed. Maximal Sterile Barrier Technique was utilized including caps, mask, sterile gowns, sterile gloves, sterile drape, hand hygiene and skin antiseptic. A timeout was performed prior to the initiation of the procedure. Initial ultrasound was performed of both right and left neck regions to evaluate patency of the jugular veins. Patency of the left internal jugular vein was confirmed by ultrasound. The left neck was sterilely prepped with chlorhexidine. Local anesthesia was provided with 1% lidocaine. Under direct ultrasound guidance, the left internal jugular vein was punctured with a 21 gauge needle. A micro-access dilator was advanced over a guidewire. A 5 French catheter was then advanced over a guidewire to the level of the central veins. Contrast venography was performed through the 5 French catheter positioned at the level of the left brachiocephalic vein. The catheter was then removed and hemostasis obtained with manual compression over the jugular puncture site. Ultrasound was performed of both groin regions. Patency of the left common femoral vein was confirmed by ultrasound. The left groin was prepped with chlorhexidine. Local anesthesia was provided with 1% lidocaine. Access of the left common femoral vein was performed under direct ultrasound guidance with a 21 gauge needle and micropuncture set. Venous access was dilated. A 24 cm, 13 cm triple-lumen Mahurkar catheter was then advanced over a guidewire under fluoroscopy. Catheter  position was confirmed by a fluoroscopic image. All 3 lumens were flushed with saline. Larger bore lumens were then locked with heparin walls. The catheter was secured at the skin with Prolene retention sutures. FINDINGS: By ultrasound, the right internal jugular vein is abnormal and tapers to the point of severe stenosis/occlusion in the lower neck. There are multiple collateral veins visualized in the neck which communicate with the external jugular vein. The external jugular vein is tortuous  and can be seen entering the medial subclavian vein. Course of the external jugular vein was not favorable for non tunneled dialysis catheter placement. The left internal jugular vein is patent in the neck and demonstrates wall thickening and internal web formation consistent with prior access and potential prior partial thrombosis. After access of the vein, a guidewire would not advance across the midline of the chest roughly at the level of the mid left brachiocephalic vein. 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 Catheter placement via the left jugular vein was therefore abandoned. There are old dialysis grafts in both proximal thighs. There is calcified and shadowing subcutaneous graft material in both proximal thighs that affects sonographic visualization of the femoral veins. The left common femoral vein was better visualized compared to the right. After access of a patent left common femoral vein, a non tunneled temporary dialysis catheter was able to be advanced with the catheter tip located in the left common iliac vein nearly at the iliac vein confluence/lower IVC. This catheter is ready for immediate use. IMPRESSION: 1. Severe stenosis versus chronic occlusion involving the right  internal jugular vein at the base of the right neck. Course of the right external jugular vein was not favorable for catheter placement. 2. 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 3. Successful placement of non tunneled temporary dialysis catheter via the left common femoral vein with the catheter tip located in the left common iliac vein nearly at the level of the iliac venous confluence. Electronically Signed   By: Aletta Edouard M.D.   On: 06/26/2021 13:45   IR US Guide Vasc Access Left  Result Date: 06/26/2021 INDICATION: Occluded dialysis access and need for temporary non tunneled dialysis catheter for immediate dialysis needs. EXAM: 1. ULTRASOUND GUIDANCE FOR VASCULAR ACCESS OF THE LEFT INTERNAL JUGULAR VEIN 2. LEFT JUGULAR VENOGRAPHY 3. ULTRASOUND GUIDANCE FOR VASCULAR ACCESS OF THE LEFT COMMON FEMORAL VEIN 4. PLACEMENT OF NON TUNNELED CENTRAL VENOUS CATHETER VIA LEFT COMMON FEMORAL VEIN WITH FLUOROSCOPIC GUIDANCE MEDICATIONS: None ANESTHESIA/SEDATION: None FLUOROSCOPY TIME:  Fluoroscopy Time: 2 minutes and 30 seconds. 43.0 mGy. CONTRAST:  10 mL Omnipaque 128 COMPLICATIONS: None immediate. PROCEDURE: Informed written consent was obtained from the patient after a thorough discussion of the procedural risks, benefits and alternatives. All questions were addressed. Maximal Sterile Barrier Technique was utilized including caps, mask, sterile gowns, sterile gloves, sterile drape, hand hygiene and skin antiseptic. A timeout was performed prior to the initiation of the procedure. Initial ultrasound was performed of both right and left neck regions to evaluate patency of the jugular veins. Patency of the left internal jugular vein was confirmed by ultrasound. The left neck was sterilely prepped with chlorhexidine. Local anesthesia was provided with 1% lidocaine. Under direct ultrasound guidance, the left internal jugular vein  was punctured with a 21 gauge needle. A micro-access dilator was advanced over a guidewire. A 5 French catheter was then advanced over a guidewire to the level of the central veins. Contrast venography was performed through the 5 French catheter positioned at the level of the left brachiocephalic vein. The catheter was then removed and hemostasis obtained with manual compression over the jugular puncture site. Ultrasound was performed of both groin regions. Patency of the left common femoral  vein was confirmed by ultrasound. The left groin was prepped with chlorhexidine. Local anesthesia was provided with 1% lidocaine. Access of the left common femoral vein was performed under direct ultrasound guidance with a 21 gauge needle and micropuncture set. Venous access was dilated. A 24 cm, 13 cm triple-lumen Mahurkar catheter was then advanced over a guidewire under fluoroscopy. Catheter position was confirmed by a fluoroscopic image. All 3 lumens were flushed with saline. Larger bore lumens were then locked with heparin walls. The catheter was secured at the skin with Prolene retention sutures. FINDINGS: By ultrasound, the right internal jugular vein is abnormal and tapers to the point of severe stenosis/occlusion in the lower neck. There are multiple collateral veins visualized in the neck which communicate with the external jugular vein. The external jugular vein is tortuous and can be seen entering the medial subclavian vein. Course of the external jugular vein was not favorable for non tunneled dialysis catheter placement. The left internal jugular vein is patent in the neck and demonstrates wall thickening and internal web formation consistent with prior access and potential prior partial thrombosis. After access of the vein, a guidewire would not advance across the midline of the chest roughly at the level of the mid left brachiocephalic vein. After catheter advancement, venography demonstrates chronic occlusion of  the left brachiocephalic vein with multiple collateral veins crossing the chest and also reconstituting external jugular flow on the right with eventual flow noted in the right brachiocephalic vein and SVC. Catheter placement via the left jugular vein was therefore abandoned. There are old dialysis grafts in both proximal thighs. There is calcified and shadowing subcutaneous graft material in both proximal thighs that affects sonographic visualization of the femoral veins. The left common femoral vein was better visualized compared to the right. After access of a patent left common femoral vein, a non tunneled temporary dialysis catheter was able to be advanced with the catheter tip located in the left common iliac vein nearly at the iliac vein confluence/lower IVC. This catheter is ready for immediate use. IMPRESSION: 1. 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 Course of the right external jugular vein was not favorable for catheter placement. 2. 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 3. Successful placement of non tunneled temporary dialysis catheter via the left common femoral vein with the catheter tip located in the left common iliac vein nearly at the level of the iliac venous confluence. Electronically Signed   By: Aletta Edouard M.D.   On: 06/26/2021 13:45    Scheduled Meds:  (feeding supplement) PROSource Plus  30 mL Oral TID BM   allopurinol  100 mg Oral Once per day on Mon Tue Thu Sat   amiodarone  200 mg Oral Daily   bictegravir-emtricitabine-tenofovir AF  1 tablet Oral Daily   brimonidine  1 drop Right Eye BID   Chlorhexidine Gluconate Cloth  6 each Topical Q0600   heparin sodium (porcine)       lidocaine       midodrine  10 mg Oral 2 times per day on Tue Thu Sat   midodrine  10 mg Oral Once per day on Sun Mon Wed Fri    montelukast  10 mg Oral QHS   multivitamin  1 tablet Oral QPM   ofloxacin  1 drop Right Eye QID   pantoprazole  40 mg Oral Daily  sucroferric oxyhydroxide  1,000 mg Oral TID with meals   Continuous Infusions:  ampicillin-sulbactam (UNASYN) IV Stopped (06/25/21 2151)    LOS: 4 days   Alma Friendly, MD [facility] PAGER is on [place]  If 7PM-7AM, please contact night-coverage [email or web address]

## 2021-06-26 NOTE — Progress Notes (Signed)
CSW spoke with [facility] representative regarding inability to initiate insurance authorization - representative states there is a pending inquiry to determine if [employer] manages the health plan for this patient. CSW unable to submit clinicals on portal until determination is made.  Madilyn Fireman, MSW, LCSW Transitions of Care  Clinical Social Worker II [phone]

## 2021-06-26 NOTE — Significant Event (Signed)
Patient has a delay in swallowing. Remains NPO due to intolerance OF po pr MD note. Plans for HD Treatment.

## 2021-06-26 NOTE — H&P (Signed)
Chief Complaint: Patient was seen in consultation today for image guided right AVG graft-gram with with possible declot and/or other interventions, and possible tunneled hemodialysis catheter placement Chief Complaint  Patient presents with   Nausea   at the request of Ejigiri, O. PA-C  Referring Physician(s): Ejigiri, O. PA-C  Supervising Physician: Aletta Edouard  Patient Status: [facility] - In-pt  History of Present Illness: David [name] is a 80 y.o. male with PMH of CHF, A. fib on Eliquis, HIV, HLD, ESRD on dialysis s/p right AVG creation who was admitted to [facility] due to generalized weakness post hemodialysis. Nephrology was consulted during this admission, and patient underwent hemodialysis on Saturday, 06/25/2021, ABG found to be clotted on arrival to HD unit and patient missed dialysis.  IR was requested for image guided graft-gramof the right AVG with possible declot and/or other interventions, and possible tunneled HD catheter placement.  Patient laying in bed, not in acute distress.  Patient appears to be lethargic, A/O x4.  Denise headache, fever, chills, shortness of breath, cough, chest pain, abdominal pain, nausea ,vomiting, and bleeding.   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   Acute on chronic systolic and diastolic heart failure, NYHA class 4 (HCC)    Anemia, iron deficiency 11/15/2011   Arthritis    "hands, right knee, feet" (02/21/2018)   Atrial fibrillation (HCC)    Cardiac arrest ([place]) 02/17/2018   CHF (congestive heart failure) (HCC)    Chronic combined systolic and diastolic heart failure, NYHA class 3 ([place]) 06/2010   felt to be secondary to hypertensive cardiomyopathy   Chronic lower back pain    CKD (chronic kidney disease) stage V requiring chronic dialysis ([place])    COVID-19 12/2019   ESRD on dialysis [facility])    started 02/2018 [place]   GERD (gastroesophageal reflux disease)     Glaucoma    "I had surgery and dont have it any more"   Gout    daily RX (02/21/2018)   Heart murmur    "mild" per pt   Hepatitis    years ago   HIV (human immunodeficiency virus infection) ([place])    HIV infection ([place])    Hyperlipidemia    Hypertension    Nonischemic cardiomyopathy ([place])    Pneumonia 11/2017   Prostate cancer ([place])    hx of prostate; s/p radioactive seed implant 10/2009 Dr Janice Norrie   Sinus bradycardia    Sleep apnea    does not use a cpap   Stroke [facility])    "mini stroke" years ago   Wears glasses     Past Surgical History:  Procedure Laterality Date   A/V FISTULAGRAM Bilateral 07/30/2020   Procedure: CENTRAL VENOGRAM;  Surgeon: Angelia Mould, MD;  Location: [place] CV [facility];  Service: Cardiovascular;  Laterality: Bilateral;  bilateral upper venogram    AV FISTULA PLACEMENT Right 10/13/2016   Procedure: ARTERIOVENOUS (AV) FISTULA CREATION;  Surgeon: [name], MD;  Location: [place];  Service: Vascular;  Laterality: Right;   AV FISTULA PLACEMENT Left 02/25/2018   Procedure: INSERTION OF ARTERIOVENOUS (AV) GORE-TEX GRAFT LEFT UPPER ARM;  Surgeon: Angelia Mould, MD;  Location: [place];  Service: Vascular;  Laterality: Left;   AV FISTULA PLACEMENT Right 08/07/2018   Procedure: Creation of right arm brachiocephalic Fistula;  Surgeon: Waynetta Sandy, MD;  Location: [place];  Service: Vascular;  Laterality: Right;   AV FISTULA PLACEMENT Left 11/10/2019  Procedure: INSERTION OF ARTERIOVENOUS (AV) GORE-TEX GRAFT THIGH;  Surgeon: Elam Dutch, MD;  Location: [place];  Service: Vascular;  Laterality: Left;   AV FISTULA PLACEMENT Right 12/02/2019   Procedure: INSERTION OF ARTERIOVENOUS (AV) GORE-TEX GRAFT RIGHT  THIGH;  Surgeon: Waynetta Sandy, MD;  Location: [place];  Service: Vascular;  Laterality: Right;   AV FISTULA PLACEMENT Right 08/13/2020   Procedure: INSERTION OF RIGHT ARTERIOVENOUS (AV) GORE-TEX GRAFT ARM;  Surgeon: Angelia Mould, MD;  Location: [place];  Service: Vascular;  Laterality: Right;   [place] Left 06/24/2015   Procedure: BASILIC VEIN TRANSPOSITION;  Surgeon: Mal Misty, MD;  Location: [place];  Service: Vascular;  Laterality: Left;   BIOPSY  03/14/2019   Procedure: BIOPSY;  Surgeon: Irene Shipper, MD;  Location: [place];  Service: Endoscopy;;   CATARACT EXTRACTION, BILATERAL Bilateral    COLONOSCOPY WITH PROPOFOL N/A 03/14/2019   Procedure: COLONOSCOPY WITH PROPOFOL;  Surgeon: Irene Shipper, MD;  Location: [place];  Service: Endoscopy;  Laterality: N/A;   ESOPHAGOGASTRODUODENOSCOPY (EGD) WITH PROPOFOL N/A 05/05/2014   Procedure: ESOPHAGOGASTRODUODENOSCOPY (EGD) WITH PROPOFOL;  Surgeon: Missy Sabins, MD;  Location: [facility];  Service: Endoscopy;  Laterality: N/A;   EYE SURGERY Bilateral    cataract surgery    GLAUCOMA SURGERY Bilateral    HIP PINNING,CANNULATED Right 06/12/2021   Procedure: CANNULATED RIGHT HIP PINNING;  Surgeon: Jessy Oto, MD;  Location: [place];  Service: Orthopedics;  Laterality: Right;   INSERTION OF ARTERIOVENOUS (AV) ARTEGRAFT ARM  10/09/2018   Procedure: INSERTION OF ARTERIOVENOUS (AV) 77mm x 41cm ARTEGRAFT LEFT UPPER ARM;  Surgeon: Waynetta Sandy, MD;  Location: [place];  Service: Vascular;;   INSERTION OF DIALYSIS CATHETER Right 07/14/2019   Procedure: Insertion Of Dialysis Catheter;  Surgeon: Elam Dutch, MD;  Location: [facility] OR;  Service: Vascular;  Laterality: Right;  placed right Internal Jugular   INSERTION OF DIALYSIS CATHETER Left 08/13/2020   Procedure: INSERTION OF DIALYSIS CATHETER;  Surgeon: Angelia Mould, MD;  Location: [place];  Service: Vascular;  Laterality: Left;   INSERTION PROSTATE RADIATION SEED     IR FLUORO GUIDE CV LINE RIGHT  02/18/2018   IR REMOVAL TUN CV CATH W/O FL  12/06/2018   IR THROMBECTOMY AV FISTULA W/THROMBOLYSIS INC/SHUNT/IMG RIGHT  11/12/2020   IR THROMBECTOMY AV FISTULA W/THROMBOLYSIS/PTA  INC/SHUNT/IMG RIGHT Right 01/26/2020   IR THROMBECTOMY AV FISTULA W/THROMBOLYSIS/PTA INC/SHUNT/IMG RIGHT Right 02/24/2020   IR THROMBECTOMY AV FISTULA W/THROMBOLYSIS/PTA INC/SHUNT/IMG RIGHT Right 04/07/2020   IR US GUIDE VASC ACCESS RIGHT  02/18/2018   IR US GUIDE VASC ACCESS RIGHT  01/26/2020   IR US GUIDE VASC ACCESS RIGHT  04/07/2020   IR US GUIDE VASC ACCESS RIGHT  11/12/2020   LEFT HEART CATH AND CORONARY ANGIOGRAPHY N/A 02/20/2018   Procedure: LEFT HEART CATH AND CORONARY ANGIOGRAPHY;  Surgeon: Jettie Booze, MD;  [facility];;; 50% ostial OM2, 25% mLAD.   LEFT HEART CATH AND CORONARY ANGIOGRAPHY  2004   mildly depressed LV systolic fx EF 32%,RJJOAC coronaries/abdominal aorta/renal arteries.   LOWER EXTREMITY ANGIOGRAM Left 11/17/2019   Procedure: Lower Extremity Fistulogram;  Surgeon: Marty Heck, MD;  Location: [place];  Service: Vascular;  Laterality: Left;   NM MYOCAR PERF WALL MOTION  02/21/2010   No ischemia or infarction.  EF 27%.   RADIOACTIVE SEED IMPLANT  2010   prostate cancer   REMOVAL OF A DIALYSIS CATHETER Right  08/13/2020   Procedure: REMOVAL OF A DIALYSIS CATHETER;  Surgeon: Angelia Mould, MD;  Location: [place];  Service: Vascular;  Laterality: Right;   REVISION OF ARTERIOVENOUS GORETEX GRAFT Right 07/11/2019   Procedure: REVISION OF ARTERIOVENOUS GORETEX GRAFT RIGHT ARM WITH ARTEGRAFT;  Surgeon: Serafina Mitchell, MD;  Location: [place];  Service: Vascular;  Laterality: Right;   SHUNTOGRAM Right 07/14/2019   Procedure: Earney Mallet;  Surgeon: Elam Dutch, MD;  Location: [place];  Service: Vascular;  Laterality: Right;   THROMBECTOMY AND REVISION OF ARTERIOVENTOUS (AV) GORETEX  GRAFT Right 07/14/2019   Procedure: THROMBECTOMY AND REVISION OF ARTERIOVENTOUS (AV) GORETEX  GRAFT;  Surgeon: Elam Dutch, MD;  Location: [place];  Service: Vascular;  Laterality: Right;   THROMBECTOMY W/ EMBOLECTOMY Left 02/26/2018   Procedure: THROMBECTOMY  ARTERIOVENOUS GRAFT;  Surgeon: Angelia Mould, MD;  Location: [place];  Service: Vascular;  Laterality: Left;   THROMBECTOMY W/ EMBOLECTOMY Left 11/17/2019   Procedure: THROMBECTOMY ARTERIOVENOUS GRAFT LEFT THIGH;  Surgeon: Marty Heck, MD;  Location: [place];  Service: Vascular;  Laterality: Left;   TRANSTHORACIC ECHOCARDIOGRAM  02/2012   ([place]) EF 45-50% with mild global HK.  Mild LVH,LA mod. dilated,mild-mod. 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 AI.   TRANSTHORACIC ECHOCARDIOGRAM  9/'18; 3/'19   a) Severely reduced EF.  25 from 30%.  GR 1 DD with diffuse ecchymosis. Mild AS & AI.  Mild LA/RA dilation; b) (post PEA arest):   Moderately dilated LV with moderate concentric virtually.  EF 15 to 20%.  GR 1 DD.  Paradoxical septal motion. Mild AI.  Mild LA dilation.  Poorly visualized RV.   TRANSTHORACIC ECHOCARDIOGRAM  02/2019   EF 20-25%.  Unable to assess diastolic function (A. Fib).  No LV apical thrombus.  Mild AI.  Mildly reduced RV function, however poorly visualized.   ULTRASOUND GUIDANCE FOR VASCULAR ACCESS  02/20/2018   Procedure: Ultrasound Guidance For Vascular Access;  Surgeon: Jettie Booze, MD;  Location: [place] CV [facility];  Service: Cardiovascular;;   UPPER EXTREMITY VENOGRAPHY N/A 07/08/2018   Procedure: UPPER EXTREMITY VENOGRAPHY;  Surgeon: Waynetta Sandy, MD;  Location: [place] [facility];  Service: Cardiovascular;  Laterality: N/A;  Bilateral    Allergies: Dextromethorphan-guaifenesin, Tocotrienols, and Losartan potassium  Medications: Prior to Admission medications   Medication Sig Start Date End Date Taking? Authorizing Provider  acetaminophen (TYLENOL) 500 MG tablet Take 1,000 mg by mouth every 6 (six) hours as needed for moderate pain.   Yes [provider]  allopurinol (ZYLOPRIM) 100 MG tablet TAKE 1 TABLET BY MOUTH 4 TIMES WEEKLY( MONDAY, [place]) AFTER DIALYSIS Patient taking  differently: Take 100 mg by mouth See admin instructions. TAKE 1 TABLET BY MOUTH 4 TIMES WEEKLY( MONDAY, TUESDAY,THURDSAY AND SATURDAY) AFTER DIALYSIS 10/20/20  Yes Croitoru, Mihai, MD  amiodarone (PACERONE) 200 MG tablet TAKE 1 TABLET(200 MG) BY MOUTH DAILY Patient taking differently: Take 200 mg by mouth daily. 10/20/20  Yes Croitoru, Mihai, MD  apixaban (ELIQUIS) 5 MG TABS tablet TAKE 1 TABLET(5 MG) BY MOUTH TWICE DAILY Patient taking differently: Take 5 mg by mouth 2 (two) times daily. 03/29/21  Yes Croitoru, Mihai, MD  [name] 50-200-25 MG TABS tablet TAKE 1 TABLET BY MOUTH DAILY 04/06/21  Yes Tommy Medal, Lavell Islam, MD  brimonidine (ALPHAGAN P) 0.1 % SOLN Place 1 drop into the right eye 2 (two) times daily.   Yes [provider]  dorzolamide-timolol (COSOPT) 22.3-6.8  MG/ML ophthalmic solution Place 1 drop into the right eye 2 (two) times daily.   Yes [provider]  Doxercalciferol (HECTOROL IV) Dialysis days Monday,Tuesday,Thursday and saturday 04/26/21 04/25/22 Yes [provider]  heparin 1000 unit/mL SOLN injection Heparin Sodium (Porcine) 1,000 Units/mL Catheter Lock Arterial 08/21/20 08/20/21 Yes [provider]  Methoxy PEG-Epoetin Beta (MIRCERA IJ) Dialysis days Monday,Tuesday,Thursday and saturday 12/16/20 04/20/22 Yes [provider]  midodrine (PROAMATINE) 10 MG tablet Take 1 tablet (10 mg total) by mouth as directed. Twice a day on the day of HD Tuesday, Thursday, Saturday (1 in AM and 1 at [place]). Take 1/2 tablet (5mg ) a day on non HD days 04/22/19  Yes Leonie Man, MD  montelukast (SINGULAIR) 10 MG tablet TAKE 1 TABLET(10 MG) BY MOUTH AT BEDTIME Patient taking differently: Take 10 mg by mouth at bedtime. 05/06/21  Yes [name], NP  multivitamin (RENA-VIT) TABS tablet Take 1 tablet by mouth every evening. 05/26/20  Yes [provider]  ofloxacin (OCUFLOX) 0.3 % ophthalmic solution Place 1 drop into the right eye 4 (four) times  daily. 02/22/21  Yes [name], NP  oxyCODONE (OXY IR/ROXICODONE) 5 MG immediate release tablet Take 1 tablet (5 mg total) by mouth every 6 (six) hours as needed for moderate pain or breakthrough pain. 06/15/21  Yes Charlynne Cousins, MD  pantoprazole (PROTONIX) 40 MG tablet TAKE 1 TABLET(40 MG) BY MOUTH DAILY Patient taking differently: Take 40 mg by mouth daily. 05/17/21  Yes [name], NP  sucroferric oxyhydroxide (VELPHORO) 500 MG chewable tablet Chew 500 mg by mouth 3 (three) times daily as needed (with meals).   Yes [provider]     Family History  Problem Relation Age of Onset   Hypertension Mother    Thyroid disease Mother    Cholelithiasis Daughter    Cholelithiasis Son    Hypertension Maternal Grandmother    Diabetes Maternal Grandmother    Heart attack Neg Hx    Hyperlipidemia Neg Hx     Social History   Socioeconomic History   Marital status: Married    Spouse name: Not on file   Number of children: 2   Years of education: 12   Highest education level: Not on file  Occupational History   Occupation: retired, picks up Aeronautical engineer: RETIRED  Tobacco Use   Smoking status: Former    Packs/day: 1.00    Years: 30.00    Pack years: 30.00    Types: Cigarettes    Quit date: 2006    Years since quitting: 16.5   Smokeless tobacco: Never  Vaping Use   Vaping Use: Never used  Substance and Sexual Activity   Alcohol use: Yes    Alcohol/week: 1.0 standard drink    Types: 1 Shots of liquor per week    Comment: occasional   Drug use: Never   Sexual activity: Not Currently    Partners: Female    Comment: declined condoms 09/2019  Other Topics Concern   Not on file  Social History Narrative   Retired, 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   Social Determinants of Health    Financial Resource Strain: Not on file  Food Insecurity: Not on  file  Transportation Needs: Not on file  Physical Activity: Not on file  Stress: Not on file  Social Connections: Not on file     Review of Systems: A 12 point ROS discussed and pertinent positives are indicated in the HPI above.  All other systems are negative.   Vital Signs: BP (!) 144/89   Pulse 98   Temp 98 F (36.7 C) (Oral)   Resp 20   Ht 6\' 2"  (1.88 m)   Wt 179 lb 0.2 oz (81.2 kg)   SpO2 100%   BMI 22.98 kg/m   Physical Exam Vitals reviewed.  Constitutional:      General: He is not in acute distress.    Appearance: He is not ill-appearing.     Comments: Appears lethargic  HENT:     Head: Normocephalic and atraumatic.  Cardiovascular:     Rate and Rhythm: Normal rate and regular rhythm.     Pulses: Normal pulses.     Heart sounds: Normal heart sounds.  Pulmonary:     Effort: Pulmonary effort is normal.     Breath sounds: Normal breath sounds.  Abdominal:     General: Abdomen is flat. Bowel sounds are normal.     Palpations: Abdomen is soft.  Musculoskeletal:     Cervical back: Neck supple.  Skin:    General: Skin is warm and dry.  Neurological:     Mental Status: He is oriented to person, place, and time.  Psychiatric:        Mood and Affect: Mood normal.        Behavior: Behavior normal.        Judgment: Judgment normal.    MD Evaluation Airway: WNL Heart: WNL Abdomen: WNL Chest/ Lungs: WNL ASA  Classification: 3 Mallampati/Airway Score: Two  Imaging: DG Chest 1 View  Result Date: 06/10/2021 CLINICAL DATA:  Recent fall with known right hip fracture, initial encounter EXAM: CHEST  1 VIEW COMPARISON:  11/30/2020 FINDINGS: Cardiac shadow is enlarged but stable. Left dialysis catheter has been removed in the interval. Lungs are clear without evidence of focal infiltrate or effusion. No pneumothorax is noted. No acute bony abnormality is seen. Vascular stenting is again noted right  arm. IMPRESSION: Stable cardiomegaly.  No acute abnormality noted. Electronically Signed   By: Inez Catalina M.D.   On: 06/10/2021 19:42   CT Head Wo Contrast  Result Date: 06/16/2021 CLINICAL DATA:  Altered mental status EXAM: CT HEAD WITHOUT CONTRAST TECHNIQUE: Contiguous axial images were obtained from the base of the skull through the vertex without intravenous contrast. COMPARISON:  09/05/2019 FINDINGS: Brain: No evidence of acute infarction, hemorrhage, hydrocephalus, extra-axial collection or mass lesion/mass effect. Mild-moderate low-density changes within the periventricular and subcortical white matter compatible with chronic microvascular ischemic change. Mild diffuse cerebral volume loss with stable size and configuration of the ventricles. Vascular: No hyperdense vessel or unexpected calcification. Skull: Normal. Negative for fracture or focal lesion. Sinuses/Orbits: No acute finding. Other: None. IMPRESSION: 1. No acute intracranial findings. 2. Chronic microvascular ischemic change and cerebral volume loss. Electronically Signed   By: Davina Poke D.O.   On: 06/06/2021 17:04   MR Brain Wo Contrast (neuro protocol)  Result Date: 06/23/2021 CLINICAL DATA:  Encephalopathy EXAM: MRI HEAD WITHOUT CONTRAST TECHNIQUE: Multiplanar, multiecho pulse sequences of the brain and surrounding structures were obtained without intravenous contrast. COMPARISON:  01/07/2019 FINDINGS: Brain: No acute infarct, mass effect or extra-axial collection. No acute or chronic hemorrhage. Hyperintense T2-weighted signal is moderately  widespread throughout the white matter. Generalized volume loss without a clear lobar predilection. The midline structures are normal. Vascular: Major flow voids are preserved. Skull and upper cervical spine: Normal calvarium and skull base. Visualized upper cervical spine and soft tissues are normal. Sinuses/Orbits:No paranasal sinus fluid levels or advanced mucosal thickening. No mastoid  or middle ear effusion. Normal orbits. IMPRESSION: 1. No acute intracranial abnormality. 2. Generalized volume loss and findings of chronic microvascular ischemia. Electronically Signed   By: Ulyses Jarred M.D.   On: 07/03/2021 23:20   DG CHEST PORT 1 VIEW  Result Date: 06/22/2021 CLINICAL DATA:  Shortness of breath. EXAM: PORTABLE CHEST 1 VIEW COMPARISON:  06/21/2021. FINDINGS: Stable severe cardiomegaly. No pulmonary venous congestion. No focal infiltrate. No pleural effusion or pneumothorax. Degenerative change thoracic spine. IMPRESSION: 1.  Stable cardiomegaly.  No pulmonary venous congestion. 2.  No acute pulmonary disease. Electronically Signed   By: Marcello Moores  Register   On: 06/22/2021 06:28   DG CHEST PORT 1 VIEW  Result Date: 06/21/2021 CLINICAL DATA:  Fever. EXAM: PORTABLE CHEST 1 VIEW COMPARISON:  Radiograph yesterday. FINDINGS: Stable enlarged cardiac silhouette. Unchanged mediastinal contours. Slight increasing vascular congestion. No confluent consolidation. Minor right lung base atelectasis. No pneumothorax or pleural effusion. No acute osseous abnormalities. IMPRESSION: 1. Stable cardiomegaly. Slight increasing vascular congestion. 2. Minor right lung base atelectasis. Electronically Signed   By: Keith Rake M.D.   On: 06/21/2021 22:48   DG Chest Portable 1 View  Result Date: 06/19/2021 CLINICAL DATA:  Recent right hip fracture, lethargy, nausea EXAM: PORTABLE CHEST 1 VIEW COMPARISON:  06/10/2021 FINDINGS: Single frontal view of the chest demonstrates an enlarged cardiac silhouette. There is chronic central vascular congestion without airspace disease, effusion, or pneumothorax. No acute bony abnormalities. IMPRESSION: 1. Stable enlarged cardiac silhouette and vascular congestion. No acute airspace disease. Electronically Signed   By: Randa Ngo M.D.   On: 06/29/2021 16:37   EEG adult  Result Date: 06/22/2021 Lora Havens, MD     06/22/2021  3:34 PM Patient Name: David [name] MRN: [id number] Epilepsy Attending: Lora Havens Referring Physician/Provider: Dr Carlisle Cater Date: 06/22/2021 Duration: 24.32 mins Patient history: 80 year old male with altered mental status.  EEG to evaluate for seizures. Level of alertness: Awake, asleep AEDs during EEG study: None Technical aspects: This EEG study was done with scalp electrodes positioned according to the 10-20 International system of electrode placement. Electrical activity was acquired at a sampling rate of 500Hz  and reviewed with a high frequency filter of 70Hz  and a low frequency filter of 1Hz . EEG data were recorded continuously and digitally stored. Description: No clear posterior dominant rhythm was seen. Sleep was characterized by vertex waves, sleep spindles (12 to 14 Hz), maximal frontocentral region.  EEG also showed continuous 6-8 Hz theta-alpha activity as well as intermittent generalized 2 to 3 Hz delta slowing.  Hyperventilation and photic stimulation were not performed.   ABNORMALITY - Continuous slow, generalized IMPRESSION: This study is suggestive of moderate diffuse encephalopathy, nonspecific etiology. No seizures or epileptiform discharges were seen throughout the recording. Lora Havens   DG C-Arm [phone] Min  Result Date: 06/12/2021 CLINICAL DATA:  RIGHT hip pinning. EXAM: OPERATIVE RIGHT HIP (WITH PELVIS IF PERFORMED) 3 VIEWS TECHNIQUE: Fluoroscopic spot image(s) were submitted for interpretation post-operatively. COMPARISON:  Plain film of the pelvis and RIGHT hip dated 06/10/2021. FINDINGS: Intraoperative fluoroscopic images showing 3 fixation screws traversing the RIGHT femoral neck fracture site. Screws appear intact and appropriately  positioned. Fluoroscopy provided for 46 seconds. IMPRESSION: Intraoperative films demonstrating screw fixation of the RIGHT femoral neck fracture. Hardware appears intact and appropriately positioned. Electronically Signed   By: Franki Cabot M.D.   On:  06/12/2021 10:14   ECHOCARDIOGRAM COMPLETE  Result Date: 06/21/2021    ECHOCARDIOGRAM REPORT   Patient Name:   David [name] Date of Exam: 06/21/2021 Medical Rec #:  [id number]            Height:       74.0 in Accession #:    [id number]           Weight:       177.7 lb Date of Birth:  06/16/1941            BSA:          2.067 m Patient Age:    32 years             BP:           126/66 mmHg Patient Gender: M                    HR:           89 bpm. Exam Location:  Inpatient Procedure: 2D Echo, Cardiac Doppler and Color Doppler Indications:    I50.23 Acute on chronic systolic (congestive) heart failure  History:        Patient has prior history of Echocardiogram examinations, most                 recent 12/01/2020. Cardiomyopathy, Stroke, Arrythmias:Atrial                 Fibrillation, Signs/Symptoms:Murmur; Risk Factors:Hypertension,                 Dyslipidemia and Sleep Apnea. ESRD. COVID-19. GERD.  Sonographer:    Jonelle Sidle Dance Referring Phys: [id number] [place]  1. Left ventricular ejection fraction, by estimation, is 20 to 25%. The left ventricle has severely decreased function. The left ventricle demonstrates global hypokinesis. The left ventricular internal cavity size was severely dilated. Left ventricular diastolic parameters are consistent with Grade II diastolic dysfunction (pseudonormalization). Elevated left atrial pressure.  2. Right ventricular systolic function is mildly reduced. The right ventricular size is severely enlarged. There is normal pulmonary artery systolic pressure. The estimated right ventricular systolic pressure is 12.8 mmHg.  3. Left atrial size was moderately dilated.  4. Right atrial size was mildly dilated.  5. The mitral valve is normal in structure. Severe mitral valve regurgitation.  6. The aortic valve is tricuspid. Aortic valve regurgitation is mild to moderate. Mild to moderate aortic valve stenosis.  7. There is mild dilatation of the ascending aorta,  measuring 42 mm.  8. The inferior vena cava is dilated in size with >50% respiratory variability, suggesting right atrial pressure of 8 mmHg. Comparison(s): Prior images reviewed side by side. The left ventricular function is unchanged. The right ventricular systolic function is worse. Mitral regurgitation appears more severe. There is systolic flow reversal in the pulmonary veins. In a hemodialysis patient, this may be due to different loading conditions. FINDINGS  Left Ventricle: Left ventricular ejection fraction, by estimation, is 20 to 25%. The left ventricle has severely decreased function. The left ventricle demonstrates global hypokinesis. The left ventricular internal cavity size was severely dilated. There is no left ventricular hypertrophy. Abnormal (paradoxical) septal motion, consistent with left bundle branch block. Left ventricular diastolic parameters are consistent with  Grade II diastolic dysfunction (pseudonormalization). Elevated left atrial  pressure. Right Ventricle: The right ventricular size is severely enlarged. No increase in right ventricular wall thickness. Right ventricular systolic function is mildly reduced. There is normal pulmonary artery systolic pressure. The tricuspid regurgitant velocity is 2.44 m/s, and with an assumed right atrial pressure of 8 mmHg, the estimated right ventricular systolic pressure is 55.7 mmHg. Left Atrium: Left atrial size was moderately dilated. Right Atrium: Right atrial size was mildly dilated. Pericardium: Trivial pericardial effusion is present. 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 The mitral valve is normal in structure. Severe mitral valve regurgitation, with centrally-directed jet. Tricuspid Valve: The tricuspid valve is normal in structure. Tricuspid valve regurgitation is trivial. Aortic Valve: The aortic valve is tricuspid. Aortic valve regurgitation is mild to moderate. Aortic regurgitation PHT measures  359 msec. Mild to moderate aortic stenosis is present. Aortic valve mean gradient measures 11.0 mmHg. Aortic valve peak gradient measures 21.3 mmHg. Aortic valve area, by VTI measures 1.26 cm. Pulmonic Valve: The pulmonic valve was grossly normal. Pulmonic valve regurgitation is not visualized. Aorta: The aortic root is normal in size and structure. There is mild dilatation of the ascending aorta, measuring 42 mm. Venous: A pattern of systolic flow reversal, suggestive of severe mitral regurgitation is recorded from the right upper pulmonary vein. The inferior vena cava is dilated in size with greater than 50% respiratory variability, suggesting right atrial pressure of 8 mmHg. IAS/Shunts: No atrial level shunt detected by color flow Doppler.  LEFT VENTRICLE PLAX 2D LVIDd:         7.07 cm  Diastology LVIDs:         6.20 cm  LV e' medial:    4.03 cm/s LV PW:         1.60 cm  LV E/e' medial:  35.0 LV IVS:        1.00 cm  LV e' lateral:   6.42 cm/s LVOT diam:     1.90 cm  LV E/e' lateral: 22.0 LV SV:         47 LV SV Index:   23 LVOT Area:     2.84 cm  RIGHT VENTRICLE             IVC RV Basal diam:  3.80 cm     IVC diam: 2.10 cm RV Mid diam:    2.80 cm RV S prime:     11.60 cm/s TAPSE (M-mode): 2.0 cm LEFT ATRIUM             Index       RIGHT ATRIUM           Index LA diam:        4.30 cm 2.08 cm/m  RA Area:     18.40 cm LA Vol (A2C):   79.3 ml 38.37 ml/m RA Volume:   51.50 ml  24.92 ml/m LA Vol (A4C):   68.8 ml 33.29 ml/m LA Biplane Vol: 74.4 ml 36.00 ml/m  AORTIC VALVE AV Area (Vmax):    1.29 cm AV Area (Vmean):   1.25 cm AV Area (VTI):     1.26 cm AV Vmax:           231.00 cm/s AV Vmean:          157.500 cm/s AV VTI:            0.375 m AV Peak Grad:      21.3 mmHg AV  Mean Grad:      11.0 mmHg LVOT Vmax:         105.00 cm/s LVOT Vmean:        69.200 cm/s LVOT VTI:          0.167 m LVOT/AV VTI ratio: 0.45 AI PHT:            359 msec  AORTA Ao Root diam: 3.00 cm Ao Asc diam:  4.20 cm MITRAL VALVE                  TRICUSPID VALVE MV Area (PHT): 3.60 cm      TR Peak grad:   23.8 mmHg MV Decel Time: 211 msec      TR Vmax:        244.00 cm/s MR Peak grad:    95.8 mmHg MR Mean grad:    59.5 mmHg   SHUNTS MR Vmax:         489.50 cm/s Systemic VTI:  0.17 m MR Vmean:        364.0 cm/s  Systemic Diam: 1.90 cm MR PISA:         1.01 cm MR PISA Eff ROA: 8 mm MR PISA Radius:  0.40 cm MV E velocity: 141.00 cm/s MV A velocity: 110.00 cm/s MV E/A ratio:  1.28 Mihai Croitoru MD Electronically signed by [name] MD Signature Date/Time: 06/21/2021/3:17:41 PM    Final    DG HIP OPERATIVE UNILAT WITH PELVIS RIGHT  Result Date: 06/12/2021 CLINICAL DATA:  RIGHT hip pinning. EXAM: OPERATIVE RIGHT HIP (WITH PELVIS IF PERFORMED) 3 VIEWS TECHNIQUE: Fluoroscopic spot image(s) were submitted for interpretation post-operatively. COMPARISON:  Plain film of the pelvis and RIGHT hip dated 06/10/2021. FINDINGS: Intraoperative fluoroscopic images showing 3 fixation screws traversing the RIGHT femoral neck fracture site. Screws appear intact and appropriately positioned. Fluoroscopy provided for 46 seconds. IMPRESSION: Intraoperative films demonstrating screw fixation of the RIGHT femoral neck fracture. Hardware appears intact and appropriately positioned. Electronically Signed   By: Franki Cabot M.D.   On: 06/12/2021 10:14   DG Hip Unilat With Pelvis 2-3 Views Right  Result Date: 06/10/2021 CLINICAL DATA:  Recent fall with right hip pain, initial encounter EXAM: DG HIP (WITH OR WITHOUT PELVIS) 3V RIGHT COMPARISON:  12/15/2020 FINDINGS: There are changes consistent with an undisplaced subcapital femoral neck fracture on the right. Very mild impaction at the fracture site is noted. These changes are new from the prior exam. Pelvic ring is intact. 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 Prostate seeds are again seen. IMPRESSION: Subcapital right femoral neck fracture with very mild impaction at the fracture site.  Electronically Signed   By: Inez Catalina M.D.   On: 06/10/2021 19:41    Labs:  CBC: Recent Labs    06/23/21 0349 06/24/21 0310 06/25/21 0247 06/26/21 0343  WBC 13.1* 9.7 11.2* 18.2*  HGB 11.1* 9.6* 9.6* 11.4*  HCT 32.1* 28.4* 28.6* 33.7*  PLT 276 276 263 346    COAGS: Recent Labs    08/19/20 0000 06/10/21 1837 06/23/2021 1700 06/22/21 0235 06/23/21 0349 06/23/21 1302 06/23/21 2237 06/24/21 0310  INR 1.3* 1.3* 1.8*  --   --   --   --   --   APTT  --   --  34   < > 54* 57* 80* 67*   < > = values in this interval not displayed.    BMP: Recent Labs    06/23/21 0349 06/24/21 0310 06/25/21  0247 06/26/21 0343  NA 139 137 136 138  K 5.2* 4.1 3.9 5.1  CL 94* 94* 92* 91*  CO2 22 26 24  21*  GLUCOSE 120* 123* 110* 143*  BUN 74* 65* 87* 107*  CALCIUM 9.5 9.1 9.3 10.1  CREATININE 9.33* 7.75* 9.60* 11.44*  GFRNONAA 5* 7* 5* 4*    LIVER FUNCTION TESTS: Recent Labs    06/17/2021 1700 06/21/21 0814 06/21/21 1742 06/22/21 0235 06/23/21 0349 06/24/21 0310 06/25/21 0247 06/26/21 0343  BILITOT 1.1 1.1 1.7* 1.0  --   --   --   --   AST 29 24 42* 51*  --   --   --   --   ALT 7 6 12 13   --   --   --   --   ALKPHOS 123 105 126 122  --   --   --   --   PROT 7.0 6.4* 8.2* 7.6  --   --   --   --   ALBUMIN 3.2* 2.9* 3.5 3.1* 2.7* 2.6* 2.4* 2.6*    TUMOR MARKERS: No results for input(s): AFPTM, CEA, CA199, CHROMGRNA in the last 8760 hours.  Assessment and Plan: 80 y.o. male with ESRD on hemodialysis via right AVG. patient is currently admitted to [facility] due to generalized weakness after hemodialysis session.  Patient went for dialysis on Saturday, 06/25/2021, right AVG was found to be clotted and patient missed dialysis.  IR was requested for image guided graft-gram with possible declot and/or other interventions and possible tunneled HD catheter. As fistulogram/graft-gram is not performed over the weekend but as the patient is in need of dialysis over the weekend, a temporary HD  catheter will be placed today.  The graft-gram with possible intervention and possible tunneled HD catheter placement is tentatively scheduled for tomorrow pending IR schedule.  Made n.p.o. at midnight VS with hypertension at baseline, afebrile CBC with leukocytosis WBC 18.2, mild anemia Hgb 11.4, PLT 346 -patient currently being treated for UTI with ABX Blood culture obtained on 06/21/2021, showed no growth in 4 days On Unasyn 3 g every 12 hours -will discuss with radiologist on the day of the procedure if additional ABX needed for possible tunneled hemodialysis catheter placement.  On Eliquis 5 mg twice daily-no need for discontinuation per IR anticoagulation guideline. INR on 7/18 1.8   Risks and benefits discussed with the patient including, but not limited to bleeding, infection, vascular injury, pneumothorax which may require chest tube placement.  All of the patient's questions were answered, patient is agreeable to proceed. Consent signed and in chart.   Thank you for this interesting consult.  I greatly enjoyed meeting [facility] and look forward to participating in their care.  A copy of this report was sent to the requesting provider on this date.  Electronically Signed: Tera Mater, PA-C 06/26/2021, 11:17 AM   I spent a total of  40 Minutes   in face to face in clinical consultation, greater than 50% of which was counseling/coordinating care for right AVG graft-gram with possible declot and/or other interventions and possible tunneled HD catheter placement.

## 2021-06-26 NOTE — Procedures (Signed)
Interventional Radiology Procedure Note  Procedure: Left jugular/central venogram, left femoral temporary dialysis catheter placement  Complications: None  Estimated Blood Loss: < 10 mL  Findings: US demonstrates occlusion/stenosis at base of right IJ with collateral reconstitution of right EJ vein. Left IJ accessed and wire would not advance beyond mid brachiocephalic vein. 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  24 cm, 13 Fr [place] temp cath placed via left common femoral vein. Tip in left common iliac vein. OK to use.  Venetia Night. Kathlene Cote, M.D Pager:  [phone]

## 2021-06-26 NOTE — Significant Event (Signed)
Patient expressed that he vomited because his pills was crushed and asked if his pills can stay whole. Pt has been NPO since lunch yesterday denies nausea at this time.

## 2021-06-26 NOTE — Consult Note (Signed)
Consultation Note Date: 06/26/2021   Patient Name: David [name]  DOB: 03-12-1941  MRN: [id number]  Age / Sex: 80 y.o., male  PCP: David Alar, NP Referring Physician: Alma Friendly, MD  Reason for Consultation: Establishing goals of care  HPI/Patient Profile: 80 y.o. 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  He was recently admitted with a hip fracture and was just seen by [name] about 10 days ago.  This admission he has suffered a UTI (recurrent), acute HF exacerbation, Afib with RVR and elevated troponin.  He has undergone CT, MRI and EEG for his encephalopathy.  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   The patient's bmi is 22 and his albumin is now 2.6.     [name] was consulted for David [name].  Recently he was seen by David [name] outpatient Palliative and a GOC form was completed in David [name] requesting full code full scope.  When David [name] PMT NP saw him last admission he confirmed that his surrogate decision maker is his wife David [name].   Clinical Assessment and Goals of Care: I have reviewed medical records including EPIC notes, labs and imaging, received report from RN, assessed the patient and then spoke on the phone at length with his wife David [name] to discuss diagnosis prognosis, [place], EOL wishes, disposition and options.  I introduced Palliative Medicine as specialized medical care for people living with serious illness. It focuses on providing relief from the symptoms and stress of a serious illness. The goal is to improve quality of life for both the patient and the family.  We discussed a brief life review of the patient and then focused on their current  illness. The natural disease trajectory and expectations at EOL were discussed.  David [name] explains that she is David [name]'s second wife.  He has 3 children from a previous marriage but they have no children together.  David [name] explains that over the last 3 years he has really declined.  She spends most of their time together fixing his meals, caring for him, and taking him to dialysis, cardiology, infectious disease, ophthalmology and other medical appointments.  In the last months - even before the hip fracture, he slept most of the time.  He suffered from painful arthritis in his hip and it was very difficult for him to walk.  Now after his recent hip fracture he is bed bound.   David [name] is very hard of hearing.  He typically speaks softly, but now his speech is so soft she can barely hear him.  She describes his eating difficulties.  Sometimes he even falls asleep with food in his mouth.  He has difficulty eating and drinking.  I asked David [name] about what she is hopeful for for David [name] - does she anticipate  he will [facility] to [name].   David [name] shares that she feels David [name] is suffering and asks if there is a different type of facility where David [name] would get better care.  I asked her about code status and she asked for my recommendation.   I shared that from a medical perspective if David [name] arrested we would be very unlikely to be able to revive him, and if we did we would be bringing him back to his current very sick state or worse.  I advised that his code status be changed to DNR so that we are not potentially making his last moments on Earth terribly with defibrillation and CPR.    I explained that David [name]'s prognosis depends on whether or not he is going to be able to eat and eat well enough to sustain himself long term.   If is he not eating well and unable to sustain himself I would recommend a David [name] for David [name].    I shared that we probably need to give it some more time to truly figure out the best plan  for him.  I asked if it would be possible to have a meeting with David [name] children.  Per David [name] they are unaware of just how sick he is - I felt it would be advisable to begin to prepare them for Kebin's last days.  Whether or not he is dying now - he is likely dying soon and it would be helpful to give them a chance to prepare.  David [name] and I agreed to speak again tomorrow (Monday) to determine when his children may be able to meet in person or by teleconference.  Questions and concerns were addressed.  The family was encouraged to call with questions or concerns.  [name] will continue to support holistically.    Primary Decision Maker:  NEXT OF KIN  David [name], wife.    SUMMARY OF RECOMMENDATIONS    [name] recommended DNR.  Will continue to evaluate for Hospice eligibility.  In David [name]'s case he would need to stop dialysis to receive Hospice support. A PEG tube is medically contraindicated in this patient with dementia living in a SNF.   [name] will contact David [name] again tomorrow to establish a time for a family meeting.   Code Status/Advance Care Planning: Full code.  - Wife is accepting of medical recommendation for DNR.  Just need to re-confirm it with her again tomorrow.   Symptom Management:  Per primary.  Additional Recommendations (Limitations, Scope, Preferences): Full Scope Treatment  Palliative Prophylaxis:  Aspiration and Delirium Protocol  Psycho-social/Spiritual:  Desire for further Chaplaincy support:  not discussed.  Prognosis:  Pending further [place] discussions and a determination of whether or not his PO intake will sustain him.  Discharge Planning: To Be Determined  SNF with Palliative vs Hospice Facility      Primary Diagnoses: Present on Admission:  A-fib [facility])   I have reviewed the medical record, interviewed the patient and family, and examined the patient. The following aspects are pertinent.  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) 06/2010   felt  to be secondary to hypertensive cardiomyopathy   Chronic lower back pain    CKD (chronic kidney disease) stage V requiring chronic dialysis ([place])    COVID-19 12/2019   ESRD on dialysis [name])    started 02/2018 [facility]   GERD (gastroesophageal reflux disease)    Glaucoma    "I had surgery and dont have it any more"   Gout    daily RX (02/21/2018)   Heart murmur    "mild" per pt   Hepatitis    years ago   HIV (human immunodeficiency virus infection) ([place])    HIV infection ([place])    Hyperlipidemia    Hypertension    Nonischemic cardiomyopathy ([place])    Pneumonia 11/2017   Prostate cancer ([place])    hx of prostate; s/p radioactive seed implant 10/2009 Dr Janice Norrie   Sinus bradycardia    Sleep apnea    does not use a cpap   Stroke ([place])    "mini stroke" years ago   Wears glasses    Social History   Socioeconomic History   Marital status: Married    Spouse name: Not on file   Number of children: 2   Years of education: 12   Highest education level: Not on file  Occupational History   Occupation: retired, picks up Aeronautical engineer: RETIRED  Tobacco Use   Smoking status: Former    Packs/day: 1.00    Years: 30.00    Pack years: 30.00    Types: Cigarettes    Quit date: 2006    Years since quitting: 16.5   Smokeless tobacco: Never  Vaping Use   Vaping Use: Never used  Substance and Sexual Activity   Alcohol use: Yes    Alcohol/week: 1.0 standard drink    Types: 1 Shots of liquor per week    Comment: occasional   Drug use: Never   Sexual activity: Not Currently     Partners: Female    Comment: declined condoms 09/2019  Other Topics Concern   Not on file  Social History Narrative   Retired, 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   Social Determinants of Health   Financial Resource Strain: Not on file  Food Insecurity: Not on file  Transportation Needs: Not on file  Physical Activity: Not on file  Stress: Not on file  Social Connections: Not on file   Family History  Problem Relation Age of Onset   Hypertension Mother    Thyroid disease Mother    Cholelithiasis Daughter    Cholelithiasis Son    Hypertension Maternal Grandmother    Diabetes Maternal Grandmother    Heart attack Neg Hx    Hyperlipidemia Neg Hx     Allergies  Allergen Reactions   Dextromethorphan-Guaifenesin Other (See Comments)    Unknown reaction   Tocotrienols Other (See Comments)    Unknown reaction   Losartan Potassium Other (See Comments)    Causes constipation        Vital Signs: BP (!) 144/89   Pulse 98   Temp 98 F (36.7 C) (Oral)   Resp 20   Ht 6\' 2"  (1.88 m)   Wt 81.2 kg   SpO2 100%  BMI 22.98 kg/m  Pain Scale: 0-10   Pain Score: 0-No pain   SpO2: SpO2: 100 % O2 Device:SpO2: 100 % O2 Flow Rate: .     Palliative Assessment/Data: 20%     Time In: 4:00 Time Out: 5:15 Time Total: 75 min.Visit consisted of counseling and education dealing with the complex and emotionally intense issues surrounding the need for palliative care and symptom management in the setting of serious and potentially life-threatening illness. Greater than 50%  of this time was spent counseling and coordinating care related to the above assessment and plan.  Signed by: Florentina Jenny, PA-C Palliative Medicine  Please contact Palliative Medicine Team phone at [phone] for questions and concerns.  For  individual provider: See Shea Evans

## 2021-06-27 DIAGNOSIS — I5023 Acute on chronic systolic (congestive) heart failure: Secondary | ICD-10-CM | POA: Diagnosis not present

## 2021-06-27 DIAGNOSIS — N186 End stage renal disease: Secondary | ICD-10-CM | POA: Diagnosis not present

## 2021-06-27 DIAGNOSIS — G9341 Metabolic encephalopathy: Secondary | ICD-10-CM

## 2021-06-27 DIAGNOSIS — I4819 Other persistent atrial fibrillation: Secondary | ICD-10-CM | POA: Diagnosis not present

## 2021-06-27 DIAGNOSIS — Z515 Encounter for palliative care: Secondary | ICD-10-CM | POA: Diagnosis not present

## 2021-06-27 DIAGNOSIS — Z992 Dependence on renal dialysis: Secondary | ICD-10-CM | POA: Diagnosis not present

## 2021-06-27 DIAGNOSIS — I4891 Unspecified atrial fibrillation: Secondary | ICD-10-CM | POA: Diagnosis not present

## 2021-06-27 LAB — RENAL FUNCTION PANEL
Albumin: 2.6 g/dL — ABNORMAL LOW (ref 3.5–5.0)
Anion gap: 26 — ABNORMAL HIGH (ref 5–15)
BUN: 107 mg/dL — ABNORMAL HIGH (ref 8–23)
CO2: 21 mmol/L — ABNORMAL LOW (ref 22–32)
Calcium: 10.1 mg/dL (ref 8.9–10.3)
Chloride: 91 mmol/L — ABNORMAL LOW (ref 98–111)
Creatinine, Ser: 11.44 mg/dL — ABNORMAL HIGH (ref 0.61–1.24)
GFR, Estimated: 4 mL/min — ABNORMAL LOW (ref >60)
Glucose, Bld: 143 mg/dL — ABNORMAL HIGH (ref 70–99)
Phosphorus: >30 mg/dL — ABNORMAL HIGH (ref 2.5–4.6)
Potassium: 5.1 mmol/L (ref 3.5–5.1)
Sodium: 138 mmol/L (ref 135–145)

## 2021-06-27 LAB — CULTURE, BLOOD (ROUTINE X 2)
Culture: NO GROWTH
Culture: NO GROWTH

## 2021-06-27 LAB — GLUCOSE, CAPILLARY: Glucose-Capillary: 117 mg/dL — ABNORMAL HIGH (ref 70–99)

## 2021-06-27 MED ORDER — ACETAMINOPHEN 325 MG PO TABS: 650.0000 mg | ORAL_TABLET | Freq: Four times a day (QID) | ORAL | Status: DC | PRN

## 2021-06-27 MED ORDER — POLYVINYL ALCOHOL 1.4 % OP SOLN
1.0000 [drp] | Freq: Four times a day (QID) | OPHTHALMIC | Status: DC | PRN
  Filled 2021-06-27: qty 15

## 2021-06-27 MED ORDER — ACETAMINOPHEN 650 MG RE SUPP: 650.0000 mg | Freq: Four times a day (QID) | RECTAL | Status: DC | PRN

## 2021-06-27 MED ORDER — GLYCOPYRROLATE 0.2 MG/ML IJ SOLN
0.4000 mg | INTRAMUSCULAR | Status: DC | PRN
  Administered 2021-06-27: 0.4 mg via INTRAVENOUS
  Filled 2021-06-27: qty 2

## 2021-06-27 MED ORDER — HEPARIN SODIUM (PORCINE) 1000 UNIT/ML IJ SOLN
INTRAMUSCULAR | Status: AC
  Filled 2021-06-27: qty 5

## 2021-06-27 MED ORDER — HALOPERIDOL LACTATE 2 MG/ML PO CONC
0.5000 mg | ORAL | Status: DC | PRN
  Filled 2021-06-27: qty 0.3

## 2021-06-27 MED ORDER — HYDROMORPHONE BOLUS VIA INFUSION
0.5000 mg | INTRAVENOUS | Status: DC | PRN
Start: 2021-06-27 — End: 2021-06-28
  Filled 2021-06-27: qty 1

## 2021-06-27 MED ORDER — SODIUM CHLORIDE 0.9% FLUSH
3.0000 mL | Freq: Two times a day (BID) | INTRAVENOUS | Status: DC
  Administered 2021-06-27: 3 mL via INTRAVENOUS

## 2021-06-27 MED ORDER — GLYCOPYRROLATE 0.2 MG/ML IJ SOLN
0.2000 mg | INTRAMUSCULAR | Status: DC | PRN
Start: 2021-06-27 — End: 2021-06-27

## 2021-06-27 MED ORDER — LORAZEPAM 2 MG/ML IJ SOLN: 0.2500 mg | Freq: Two times a day (BID) | INTRAMUSCULAR | Status: DC

## 2021-06-27 MED ORDER — AMIODARONE HCL IN DEXTROSE 360-4.14 MG/200ML-% IV SOLN
30.0000 mg/h | INTRAVENOUS | Status: DC
  Filled 2021-06-27: qty 200

## 2021-06-27 MED ORDER — METOPROLOL TARTRATE 5 MG/5ML IV SOLN: 5.0000 mg | Freq: Three times a day (TID) | INTRAVENOUS | Status: DC | PRN

## 2021-06-27 MED ORDER — SODIUM CHLORIDE 0.9% FLUSH
3.0000 mL | INTRAVENOUS | Status: DC | PRN
  Administered 2021-06-27 (×2): 3 mL via INTRAVENOUS

## 2021-06-27 MED ORDER — SODIUM CHLORIDE 0.9 % IV SOLN: 250.0000 mL | INTRAVENOUS | Status: DC | PRN

## 2021-06-27 MED ORDER — GLYCOPYRROLATE 1 MG PO TABS
1.0000 mg | ORAL_TABLET | ORAL | Status: DC | PRN
  Filled 2021-06-27: qty 1

## 2021-06-27 MED ORDER — AMIODARONE HCL IN DEXTROSE 360-4.14 MG/200ML-% IV SOLN: 60.0000 mg/h | INTRAVENOUS | Status: DC

## 2021-06-27 MED ORDER — LORAZEPAM 2 MG/ML IJ SOLN: 1.0000 mg | INTRAMUSCULAR | Status: DC | PRN

## 2021-06-27 MED ORDER — ONDANSETRON HCL 4 MG/2ML IJ SOLN: 4.0000 mg | Freq: Four times a day (QID) | INTRAMUSCULAR | Status: DC | PRN

## 2021-06-27 MED ORDER — HALOPERIDOL LACTATE 5 MG/ML IJ SOLN: 0.5000 mg | INTRAMUSCULAR | Status: DC | PRN

## 2021-06-27 MED ORDER — HYDROMORPHONE HCL 1 MG/ML IJ SOLN: 0.5000 mg | INTRAMUSCULAR | Status: DC | PRN

## 2021-06-27 MED ORDER — LORAZEPAM 2 MG/ML PO CONC: 1.0000 mg | ORAL | Status: DC | PRN

## 2021-06-27 MED ORDER — HALOPERIDOL 0.5 MG PO TABS
0.5000 mg | ORAL_TABLET | ORAL | Status: DC | PRN
  Filled 2021-06-27: qty 1

## 2021-06-27 MED ORDER — SODIUM CHLORIDE 0.9 % IV SOLN
0.5000 mg/h | INTRAVENOUS | Status: DC
  Administered 2021-06-27: 0.5 mg/h via INTRAVENOUS
  Administered 2021-06-28: 1 mg/h via INTRAVENOUS
  Filled 2021-06-27 (×2): qty 2.5

## 2021-06-27 MED ORDER — AMIODARONE LOAD VIA INFUSION
150.0000 mg | Freq: Once | INTRAVENOUS | Status: DC
  Filled 2021-06-27: qty 83.34

## 2021-06-27 MED ORDER — METOPROLOL TARTRATE 5 MG/5ML IV SOLN
INTRAVENOUS | Status: AC
  Administered 2021-06-27: 2.5 mg via INTRAVENOUS
  Filled 2021-06-27: qty 5

## 2021-06-27 MED ORDER — METOPROLOL TARTRATE 5 MG/5ML IV SOLN
2.5000 mg | Freq: Once | INTRAVENOUS | Status: AC
  Administered 2021-06-27: 2.5 mg via INTRAVENOUS

## 2021-06-27 MED ORDER — LORAZEPAM 1 MG PO TABS: 1.0000 mg | ORAL_TABLET | ORAL | Status: DC | PRN

## 2021-06-27 MED ORDER — ONDANSETRON 4 MG PO TBDP
4.0000 mg | ORAL_TABLET | Freq: Four times a day (QID) | ORAL | Status: DC | PRN
  Filled 2021-06-27: qty 1

## 2021-06-27 MED ORDER — HEPARIN SODIUM (PORCINE) 1000 UNIT/ML DIALYSIS: 20.0000 [IU]/kg | INTRAMUSCULAR | Status: DC | PRN

## 2021-06-27 MED ORDER — GLYCOPYRROLATE 0.2 MG/ML IJ SOLN: 0.2000 mg | INTRAMUSCULAR | Status: DC | PRN

## 2021-06-27 MED ORDER — BIOTENE DRY MOUTH MT LIQD: 15.0000 mL | OROMUCOSAL | Status: DC | PRN

## 2021-06-27 NOTE — Progress Notes (Signed)
Pt w/ rapid afib in 140's - 150's this am, unable to dialyze, didn't respond to IV metoprolol. Pt is doing very poorly on all fronts (see pall care note from yesterday) and we are likely just prolonging his suffering w/ full medical care at this point.  I spoke w/ pt's wife Keilan Nichol. She stated he has suffered a lot and she was glad to have discussed options yesterday w/ palliative care. I suggested at this time we consider transition to hospice care (no further dialysis/ comfort care) and she is in agreement. Will have palliative care and primary MD confirm.    Kelly Splinter, MD 06/27/2021, 9:08 AM

## 2021-06-27 NOTE — Progress Notes (Signed)
[facility] ([facility]) Hospital Liaison note.     Received request from TOC manager for family interest in [facility]. Patient information has been forwarded to [facility] for review.  [facility] liaison will follow up once eligibility and availability have been determined.   A Please do not hesitate to call with questions.    Thank you,   Mary Anne Robertson, RN, CCM      [facility] Hospital Liaison  [phone] 

## 2021-06-27 NOTE — Progress Notes (Signed)
Plan now is for transition to hospice, no further dialysis.  Appreciate all assistance. Will sign off.   Kelly Splinter, MD 06/27/2021, 2:46 PM

## 2021-06-27 NOTE — Progress Notes (Signed)
Daily Progress Note   Patient Name: David [name]       Date: 06/27/2021 DOB: [birth year]  Age: 80 y.o. MRN#: [id number] Attending Physician: Alma Friendly, MD Primary Care Physician: [name], NP Admit Date: 06/30/2021  Reason for Consultation/Follow-up:  To discuss complex medical decision making related to patient's goals of care  Received information from Dr. Jonnie Finner that patient is not able to dialyze as he is very tachycardic and his heart rate is not responsive to metoprolol.    Subjective: Spoke with [name] and nephew Coralyn Mark at bedside.  Mr.  Richart and his family have had a difficult course over the last 4 years with the patient's health.  They have heard from Dr. Jonnie Finner and understand that we are in a different place now.  We talked briefly about the multiple things not working well in his heart that have brought [place] to this point this morning.   Code status changed to DNR.  Family is requesting that we focus on his comfort - they are aware that he will likely die in days.  Patient appears comfortable now.  [name] and Coralyn Mark are in agreement that he is comfortable.  Patient is able to tell [name] he loves her.  He has been responding to his nephew.   Patient and Coralyn Mark are both [employer], so we put "[employer]" on You Tube to play in the room.    Discussed transfer to [facility].  Family would appreciate the larger room and easier access.  Many family members are expected to visit.  We talked about transfer to [facility].  There are no beds today.  I would not surprise me if Mr. Guerrette is unable to transfer tomorrow (but he has surprised me in the past).   Nephew is comfortable keeping him here.   Assessment: Patient comfortable.  Is able to respond to his  family in a limited fashion.  In my view his eyes are open but he is not seeing.  He does not focus on me.  He has a pleasant smile.   Patient Profile/HPI:  80 y.o. 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  He was recently admitted with a hip fracture and was just seen by PMT about 10 days ago.  This admission he has suffered a UTI (recurrent), acute HF exacerbation, Afib with RVR and elevated troponin.  He has undergone CT, MRI and EEG for his encephalopathy.  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   The patient's bmi is 22 and his albumin is now 2.6.      PMT was consulted for [place].  Recently he was seen by [facility] outpatient Palliative and a GOC form was completed in [place] requesting full code full scope.  When [name] PMT NP saw him last admission he confirmed that his surrogate decision maker is his wife Aurelius Gildersleeve.    Length of Stay: 5   Vital Signs: BP (!) 152/80 (BP Location: Left Arm)   Pulse (!) 51   Temp 97.7 F (36.5 C) (Oral)   Resp 19   Ht 6\' 2"  (1.88 m)   Wt 81.2 kg   SpO2 100%   BMI 22.98 kg/m  SpO2: SpO2: 100 % O2 Device: O2 Device: Room Air O2 Flow Rate:         Palliative Assessment/Data: 10%     Palliative Care Plan    Recommendations/Plan: Comfort measures only.   Likely to decline very quickly.  No beds to day at [facility].  Not sure he would be able to transfer tomorrow. [name] (is lovely) she is very fragile and needs support. PMT will follow for symptom management and support. Have requested a bed on 6N   Code Status:  DNR  Prognosis:  Hours - Days   Discharge Planning: Anticipated Hospital Death vs Hospice Facility.  Care plan was discussed with Medical Team, wife, nephew.  Thank you for  allowing the Palliative Medicine Team to assist in the care of this patient.  Total time spent:  35 min.     Greater than 50%  of this time was spent counseling and coordinating care related to the above assessment and plan.  Florentina Jenny, PA-C Palliative Medicine  Please contact Palliative MedicineTeam phone at [phone] for questions and concerns between 7 am - 7 pm.   Please see AMION for individual provider pager numbers.

## 2021-06-27 NOTE — Consult Note (Signed)
   [name] Inpatient Consult   06/27/2021  Junious Ragone 02/23/1941 [id number]  [place] [facility] [AC[facility]] Patient: [employer]   Follow up: Progression   Patient is transitioning to comfort measures noted.  Natividad Brood, RN BSN [facility] Liaison  [phone] business mobile phone Toll free office [phone]  Fax number: [phone] [email or web address]

## 2021-06-27 NOTE — Progress Notes (Signed)
PROGRESS NOTE    Rohil Lesch  YHC:[id number] DOB: 06/13/1941 DOA: 06/15/2021 PCP: [name], NP   Brief Narrative:  80 year old chronically ill-appearing male with a past medical history significant for but not limited to ESRD on hemodialysis M/T/T/S, Afib on [facility], history of gout, chronic hypotension, GERD, recent admission for right hip fracture status post repair presented from his SNF for generalized weakness and nausea.  Work-up in the ED revealed electrolyte disturbances, elevated BNP greater than 4000, mild pulmonary edema with concern for acute CHF and elevated troponin.  TRH was asked to admit this patient but patient was unable to provide a subjective history due to his lethargy.  He underwent head imaging with a head CT scan as well as a MRI.  Head CT done showed no acute intracranial findings but did show chronic microvascular ischemic changes and cerebral volume loss.  MRI done showed no acute intracranial abnormality and showed grade generalized volume loss and findings of chronic microvascular ischemia. On 7/19, after HD, was found to be in A. fib with RVR, cardiology consulted.  Patient admitted for further management.    Assessment & Plan:   Active Problems:   ESRD (end stage renal disease) on dialysis (HCC)   Acute on chronic systolic CHF (congestive heart failure), NYHA class 4 (HCC)   Atrial fibrillation with RVR (HCC)   Generalized weakness   A-fib (HCC)   Acute metabolic encephalopathy   Possible acute metabolic encephalopathy  Possible UTI Failure to thrive, poor oral intake- progressed to comfort care Noted 100.8 fever on 06/21/2021 UA with large leukocytes, greater than 50 WBC UC growing >100,000 Acinetobacter calcoaceticus/baumannii complex, s/p Unasyn BC x2 NGTD Chest x-ray unremarkable for any infection Procalcitonin elevated at 29 Head CT and MRI brain done showed no acute intracranial findings but did show chronic microvascular ischemic  changes and cerebral volume loss.   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  Attempted on 06/27/2021 outpatient in A. fib with RVR and heart rates in the 150s No longer a dialysis candidate, switched to comfort care  Persistent A. 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  Goals of care discussion Patient with very poor prognosis, multiple comorbidities Palliative care consulted-extensive discussion with family, agreed to switch patient to comfort care, made DNR on 06/27/2021 Plan for residential hospice if patient survives through hospitalization, hospital death anticipated     DVT prophylaxis: None Code Status: DNR Family Communication: None at bedside Disposition Plan: Residential hospice Vs anticipated hospital death  Status is: Inpatient  The patient will require care spanning > 2 midnights and should be moved to inpatient because: Unsafe d/c plan, IV treatments  appropriate due to intensity of illness or inability to take PO, and Inpatient level  of care appropriate due to severity of illness  Dispo:  Patient From: [place]  Planned Disposition: Residential hospice versus anticipated hospital death  Medically stable for discharge: Yes to residential hospice       Consultants:  Cardiology Nephrology   Procedures: ECHOCARDIOGRAM IMPRESSIONS     1. Left ventricular ejection fraction, by estimation, is 20 to 25%. The  left ventricle has severely decreased function. The left ventricle  demonstrates global hypokinesis. The left ventricular internal cavity size  was severely dilated. Left ventricular  diastolic parameters are consistent with Grade II diastolic dysfunction  (pseudonormalization). Elevated left atrial pressure.   2. Right ventricular systolic function is mildly reduced. The right  ventricular size is severely enlarged. There is normal pulmonary artery  systolic pressure. The estimated right ventricular systolic pressure is  24.5 mmHg.   3. Left atrial size was moderately dilated.   4. Right atrial size was mildly dilated.   5. The mitral valve is normal in structure. Severe mitral valve  regurgitation.   6. The aortic valve is tricuspid. Aortic valve regurgitation is mild to  moderate. Mild to moderate aortic valve stenosis.   7. There is mild dilatation of the ascending aorta, measuring 42 mm.   8. The inferior vena cava is dilated in size with >50% respiratory  variability, suggesting right atrial pressure of 8 mmHg.   Comparison(s): Prior images reviewed side by side. The left ventricular  function is unchanged. The right ventricular systolic function is worse.  Mitral regurgitation appears more severe. There is systolic flow reversal  in the pulmonary veins. In a  hemodialysis patient, this may be due to different loading conditions.   FINDINGS   Left Ventricle: Left ventricular ejection fraction, by estimation, is 20  to 25%. The left ventricle has severely decreased function. The  left  ventricle demonstrates global hypokinesis. The left ventricular internal  cavity size was severely dilated.  There is no left ventricular hypertrophy. Abnormal (paradoxical) septal  motion, consistent with left bundle branch block. Left ventricular  diastolic parameters are consistent with Grade II diastolic dysfunction  (pseudonormalization). Elevated left atrial   pressure.   Right Ventricle: The right ventricular size is severely enlarged. No  increase in right ventricular wall thickness. Right ventricular systolic  function is mildly reduced. There is normal pulmonary artery systolic  pressure. The tricuspid regurgitant  velocity is 2.44 m/s, and with an assumed right atrial pressure of 8 mmHg,  the estimated right ventricular systolic pressure is 80.9 mmHg.   Left Atrium: Left atrial size was moderately dilated.   Right Atrium: Right atrial size was mildly dilated.   Pericardium: Trivial pericardial effusion is present.   Mitral Valve: There is diastolic MR due to long AV delay/first degree AV  block. The mitral valve is normal in structure. Severe mitral valve  regurgitation, with centrally-directed jet.   Tricuspid Valve: The tricuspid valve is normal in structure. Tricuspid  valve regurgitation is trivial.   Aortic Valve: The aortic valve is tricuspid. Aortic valve regurgitation is  mild to moderate. Aortic regurgitation PHT measures 359 msec. Mild to  moderate aortic stenosis is present. Aortic valve mean gradient measures  11.0 mmHg. Aortic valve peak  gradient measures 21.3 mmHg. Aortic valve area, by VTI measures 1.26 cm.   Pulmonic Valve: The pulmonic valve was grossly normal. Pulmonic valve  regurgitation is not visualized.   Aorta: The aortic  root is normal in size and structure. There is mild  dilatation of the ascending aorta, measuring 42 mm.   Venous: A pattern of systolic flow reversal, suggestive of severe mitral  regurgitation is recorded from  the right upper pulmonary vein. The  inferior vena cava is dilated in size with greater than 50% respiratory  variability, suggesting right atrial  pressure of 8 mmHg.   IAS/Shunts: No atrial level shunt detected by color flow Doppler.      LEFT VENTRICLE  PLAX 2D  LVIDd:         7.07 cm  Diastology  LVIDs:         6.20 cm  LV e' medial:    4.03 cm/s  LV PW:         1.60 cm  LV E/e' medial:  35.0  LV IVS:        1.00 cm  LV e' lateral:   6.42 cm/s  LVOT diam:     1.90 cm  LV E/e' lateral: 22.0  LV SV:         47  LV SV Index:   23  LVOT Area:     2.84 cm      RIGHT VENTRICLE             IVC  RV Basal diam:  3.80 cm     IVC diam: 2.10 cm  RV Mid diam:    2.80 cm  RV S prime:     11.60 cm/s  TAPSE (M-mode): 2.0 cm   LEFT ATRIUM             Index       RIGHT ATRIUM           Index  LA diam:        4.30 cm 2.08 cm/m  RA Area:     18.40 cm  LA Vol (A2C):   79.3 ml 38.37 ml/m RA Volume:   51.50 ml  24.92 ml/m  LA Vol (A4C):   68.8 ml 33.29 ml/m  LA Biplane Vol: 74.4 ml 36.00 ml/m   AORTIC VALVE  AV Area (Vmax):    1.29 cm  AV Area (Vmean):   1.25 cm  AV Area (VTI):     1.26 cm  AV Vmax:           231.00 cm/s  AV Vmean:          157.500 cm/s  AV VTI:            0.375 m  AV Peak Grad:      21.3 mmHg  AV Mean Grad:      11.0 mmHg  LVOT Vmax:         105.00 cm/s  LVOT Vmean:        69.200 cm/s  LVOT VTI:          0.167 m  LVOT/AV VTI ratio: 0.45  AI PHT:            359 msec     AORTA  Ao Root diam: 3.00 cm  Ao Asc diam:  4.20 cm   MITRAL VALVE                 TRICUSPID VALVE  MV Area (PHT): 3.60 cm      TR Peak grad:   23.8 mmHg  MV Decel Time: 211 msec      TR Vmax:        244.00 cm/s  MR  Peak grad:    95.8 mmHg  MR Mean grad:    59.5 mmHg   SHUNTS  MR Vmax:         489.50 cm/s Systemic VTI:  0.17 m  MR Vmean:        364.0 cm/s  Systemic Diam: 1.90 cm  MR PISA:         1.01 cm  MR PISA Eff ROA: 8 mm  MR PISA Radius:  0.40 cm  MV E velocity: 141.00 cm/s   MV A velocity: 110.00 cm/s  MV E/A ratio:  1.28   Antimicrobials:  Anti-infectives (From admission, onward)    Start     Dose/Rate Route Frequency Ordered Stop   06/24/21 1800  Ampicillin-Sulbactam (UNASYN) 3 g in sodium chloride 0.9 % 100 mL IVPB  Status:  Discontinued        3 g 200 mL/hr over 30 Minutes Intravenous Every 12 hours 06/24/21 1617 06/27/21 0931   06/21/21 2315  cefTRIAXone (ROCEPHIN) 2 g in sodium chloride 0.9 % 100 mL IVPB  Status:  Discontinued        2 g 200 mL/hr over 30 Minutes Intravenous Every 24 hours 06/21/21 2227 06/24/21 1617   06/21/21 1000  bictegravir-emtricitabine-tenofovir AF ([name]) 50-200-25 MG per tablet 1 tablet  Status:  Discontinued        1 tablet Oral Daily 06/27/2021 2213 06/27/21 0931        Subjective: Saw patient after he was brought down from the HD unit, patient able to engage in simple conversation with me and appeared very lethargic with some mild respiratory distress.  Dilaudid drip ordered by hospice team.   Objective: Vitals:   06/27/21 0001 06/27/21 0349 06/27/21 0739 06/27/21 1116  BP: 130/84 (!) 149/82 (!) 142/82 (!) 152/80  Pulse: 94 81 (!) 121 (!) 51  Resp: 18 20 (!) 22 19  Temp: 97.8 F (36.6 C) 97.6 F (36.4 C) (!) 96.9 F (36.1 C) 97.7 F (36.5 C)  TempSrc: Oral Oral Oral Oral  SpO2: 100% 99% 99% 100%  Weight:  81.2 kg    Height:        Intake/Output Summary (Last 24 hours) at 06/27/2021 1754 Last data filed at 06/27/2021 1600 Gross per 24 hour  Intake 425.91 ml  Output 1 ml  Net 424.91 ml   Filed Weights   06/24/21 0404 06/25/21 0551 06/27/21 0349  Weight: 80.2 kg 81.2 kg 81.2 kg   Examination: Physical Exam: General: NAD, very deconditioned, chronically ill-appearing, lethargic Cardiovascular: S1, S2 present Respiratory: Diminished breath sounds bilaterally Abdomen: Soft, nontender, nondistended, bowel sounds present Musculoskeletal: No bilateral pedal edema noted Skin: Normal Psychiatry: Unable  to assess    Data Reviewed: I have personally reviewed following labs and imaging studies  CBC: Recent Labs  Lab 06/21/21 0814 06/21/21 1136 06/21/21 1843 06/22/21 0235 06/23/21 0349 06/24/21 0310 06/25/21 0247 06/26/21 0343  WBC 10.3  --  17.4* 16.6* 13.1* 9.7 11.2* 18.2*  NEUTROABS 7.9*  --  15.2* 13.4*  --   --   --   --   HGB 9.8*   < > 12.1* 12.2* 11.1* 9.6* 9.6* 11.4*  HCT 29.6*   < > 37.8* 36.5* 32.1* 28.4* 28.6* 33.7*  MCV 108.4*  --  110.5* 105.5* 102.9* 102.2* 102.5* 100.9*  PLT 260  --  214 272 276 276 263 346   < > = values in this interval not displayed.   Basic Metabolic Panel: Recent Labs  Lab 06/21/21 [phone]  06/21/21 1136 06/21/21 1742 06/22/21 0235 06/23/21 0349 06/24/21 0310 06/25/21 0247 06/26/21 0343  NA 137   < > 138 138 139 137 136 138  K 5.7*   < > 5.1 5.3* 5.2* 4.1 3.9 5.1  CL 94*  --  94* 93* 94* 94* 92* 91*  CO2 28  --  26 27 22 26 24  21*  GLUCOSE 99  --  94 126* 120* 123* 110* 143*  BUN 75*  --  44* 44* 74* 65* 87* 107*  CREATININE 10.68*  --  7.23* 7.51* 9.33* 7.75* 9.60* 11.44*  CALCIUM 8.9  --  9.5 9.6 9.5 9.1 9.3 10.1  MG 2.7*  --  2.6* 2.6*  --   --   --   --   PHOS 10.3*  --  7.7* 8.2* 10.4* 7.7* 9.0* >30.0*   < > = values in this interval not displayed.   GFR: Estimated Creatinine Clearance: 6 mL/min (A) (by C-G formula based on SCr of 11.44 mg/dL (H)). Liver Function Tests: Recent Labs  Lab 06/21/21 0814 06/21/21 1742 06/22/21 0235 06/23/21 0349 06/24/21 0310 06/25/21 0247 06/26/21 0343  AST 24 42* 51*  --   --   --   --   ALT 6 12 13   --   --   --   --   ALKPHOS 105 126 122  --   --   --   --   BILITOT 1.1 1.7* 1.0  --   --   --   --   PROT 6.4* 8.2* 7.6  --   --   --   --   ALBUMIN 2.9* 3.5 3.1* 2.7* 2.6* 2.4* 2.6*   No results for input(s): LIPASE, AMYLASE in the last 168 hours. No results for input(s): AMMONIA in the last 168 hours.  Coagulation Profile: No results for input(s): INR, PROTIME in the last 168  hours.  Cardiac Enzymes: No results for input(s): CKTOTAL, CKMB, CKMBINDEX, TROPONINI in the last 168 hours. BNP (last 3 results) No results for input(s): PROBNP in the last 8760 hours. HbA1C: No results for input(s): HGBA1C in the last 72 hours. CBG: Recent Labs  Lab 06/16/2021 2212 06/21/21 0831 06/26/21 0832 06/27/21 1209  GLUCAP 111* 110* 131* 117*   Lipid Profile: No results for input(s): CHOL, HDL, LDLCALC, TRIG, CHOLHDL, LDLDIRECT in the last 72 hours. Thyroid Function Tests: No results for input(s): TSH, T4TOTAL, FREET4, T3FREE, THYROIDAB in the last 72 hours.  Anemia Panel: No results for input(s): VITAMINB12, FOLATE, FERRITIN, TIBC, IRON, RETICCTPCT in the last 72 hours.  Sepsis Labs: Recent Labs  Lab 06/21/2021 2105 06/21/21 2318 06/22/21 0235 06/23/21 0349 06/24/21 0310 06/25/21 0247  PROCALCITON  --   --   --  29.91 26.07 22.80  LATICACIDVEN 1.6 1.8 1.7  --   --   --     Recent Results (from the past 240 hour(s))  Blood culture (routine x 2)     Status: None   Collection Time: 06/22/2021  3:53 PM   Specimen: BLOOD  Result Value Ref Range Status   Specimen Description BLOOD SITE NOT SPECIFIED  Final   Special Requests   Final    BOTTLES DRAWN AEROBIC AND ANAEROBIC Blood Culture results may not be optimal due to an excessive volume of blood received in culture bottles IV START   Culture   Final    NO GROWTH 5 DAYS Performed at [facility], [place] [address] [place], [place] [address]  Report Status 06/25/2021 FINAL  Final  SARS CORONAVIRUS 2 (TAT 6-24 HRS) Nasopharyngeal Nasopharyngeal Swab     Status: None   Collection Time: 06/18/2021  4:50 PM   Specimen: Nasopharyngeal Swab  Result Value Ref Range Status   SARS Coronavirus 2 NEGATIVE NEGATIVE Final    Comment: (NOTE) SARS-CoV-2 target nucleic acids are NOT DETECTED.  The SARS-CoV-2 RNA is generally detectable in upper and lower respiratory specimens during the acute phase of infection.  Negative results do not preclude SARS-CoV-2 infection, do not rule out co-infections with other pathogens, and should not be used as the sole basis for treatment or other patient management decisions. Negative results must be combined with clinical observations, patient history, and epidemiological information. The expected result is Negative.  Fact Sheet for Patients: SugarRoll.be  Fact Sheet for Healthcare Providers: https://www.woods-mathews.com/  This test is not yet approved or cleared by the [place] FDA and  has been authorized for detection and/or diagnosis of SARS-CoV-2 by FDA under an Emergency Use Authorization (EUA). This EUA will remain  in effect (meaning this test can be used) for the duration of the COVID-19 declaration under Se ction 564(b)(1) of the Act, 21 U.S.C. section 360bbb-3(b)(1), unless the authorization is terminated or revoked sooner.  Performed at [facility], [place] [address], [place], [place] [address]   Blood culture (routine x 2)     Status: None   Collection Time: 06/27/2021  9:08 PM   Specimen: BLOOD  Result Value Ref Range Status   Specimen Description BLOOD LEFT HAND  Final   Special Requests   Final    BOTTLES DRAWN AEROBIC AND ANAEROBIC Blood Culture adequate volume   Culture   Final    NO GROWTH 5 DAYS Performed at [facility], [place] [address], [place], [place] [address]    Report Status 06/25/2021 FINAL  Final  Culture, blood (routine x 2)     Status: None   Collection Time: 06/21/21 11:07 PM   Specimen: BLOOD LEFT HAND  Result Value Ref Range Status   Specimen Description BLOOD LEFT HAND  Final   Special Requests   Final    BOTTLES DRAWN AEROBIC ONLY Blood Culture results may not be optimal due to an inadequate volume of blood received in culture bottles   Culture   Final    NO GROWTH 5 DAYS Performed at [facility], [place] [address] [place], [place] [address]     Report Status 06/27/2021 FINAL  Final  Culture, blood (routine x 2)     Status: None   Collection Time: 06/21/21 11:14 PM   Specimen: BLOOD  Result Value Ref Range Status   Specimen Description BLOOD LEFT THUMB  Final   Special Requests   Final    BOTTLES DRAWN AEROBIC ONLY Blood Culture results may not be optimal due to an inadequate volume of blood received in culture bottles   Culture   Final    NO GROWTH 5 DAYS Performed at [facility], [place] [address], [place], [place] [address]    Report Status 06/27/2021 FINAL  Final  Urine Culture     Status: Abnormal   Collection Time: 06/22/21 10:02 AM   Specimen: Urine, Clean Catch  Result Value Ref Range Status   Specimen Description URINE, CLEAN CATCH  Final   Special Requests   Final    NONE Performed at [facility], [place] [address] [place], [place] [address]  Culture (A)  Final    >=100,000 COLONIES/mL ACINETOBACTER CALCOACETICUS/BAUMANNII COMPLEX   Report Status 06/24/2021 FINAL  Final   Organism ID, Bacteria ACINETOBACTER CALCOACETICUS/BAUMANNII COMPLEX (A)  Final      Susceptibility   Acinetobacter calcoaceticus/baumannii complex - MIC*    CEFTAZIDIME 8 SENSITIVE Sensitive     CIPROFLOXACIN 0.5 SENSITIVE Sensitive     GENTAMICIN <=1 SENSITIVE Sensitive     IMIPENEM <=0.25 SENSITIVE Sensitive     PIP/TAZO <=4 SENSITIVE Sensitive     TRIMETH/SULFA <=20 SENSITIVE Sensitive     AMPICILLIN/SULBACTAM <=2 SENSITIVE Sensitive     * >=100,000 COLONIES/mL ACINETOBACTER CALCOACETICUS/BAUMANNII COMPLEX    RN Pressure Injury Documentation:     Estimated body mass index is 22.98 kg/m as calculated from the following:   Height as of this encounter: 6\' 2"  (1.88 m).   Weight as of this encounter: 81.2 kg.  Malnutrition Type: Nutrition Problem: Increased nutrient needs Etiology: chronic illness (ESRD on HD) Malnutrition Characteristics: Signs/Symptoms: estimated needs Nutrition Interventions: Interventions: MVI,  Prostat, Magic cup  Radiology Studies: IR Veno/Jugular Left  Result Date: 06/26/2021 INDICATION: Occluded dialysis access and need for temporary non tunneled dialysis catheter for immediate dialysis needs. EXAM: 1. ULTRASOUND GUIDANCE FOR VASCULAR ACCESS OF THE LEFT INTERNAL JUGULAR VEIN 2. LEFT JUGULAR VENOGRAPHY 3. ULTRASOUND GUIDANCE FOR VASCULAR ACCESS OF THE LEFT COMMON FEMORAL VEIN 4. PLACEMENT OF NON TUNNELED CENTRAL VENOUS CATHETER VIA LEFT COMMON FEMORAL VEIN WITH FLUOROSCOPIC GUIDANCE MEDICATIONS: None ANESTHESIA/SEDATION: None FLUOROSCOPY TIME:  Fluoroscopy Time: 2 minutes and 30 seconds. 43.0 mGy. CONTRAST:  10 mL Omnipaque [id number] COMPLICATIONS: None immediate. PROCEDURE: Informed written consent was obtained from the patient after a thorough discussion of the procedural risks, benefits and alternatives. All questions were addressed. Maximal Sterile Barrier Technique was utilized including caps, mask, sterile gowns, sterile gloves, sterile drape, hand hygiene and skin antiseptic. A timeout was performed prior to the initiation of the procedure. Initial ultrasound was performed of both right and left neck regions to evaluate patency of the jugular veins. Patency of the left internal jugular vein was confirmed by ultrasound. The left neck was sterilely prepped with chlorhexidine. Local anesthesia was provided with 1% lidocaine. Under direct ultrasound guidance, the left internal jugular vein was punctured with a 21 gauge needle. A micro-access dilator was advanced over a guidewire. A 5 French catheter was then advanced over a guidewire to the level of the central veins. Contrast venography was performed through the 5 French catheter positioned at the level of the left brachiocephalic vein. The catheter was then removed and hemostasis obtained with manual compression over the jugular puncture site. Ultrasound was performed of both groin regions. Patency of the left common femoral vein was confirmed by  ultrasound. The left groin was prepped with chlorhexidine. Local anesthesia was provided with 1% lidocaine. Access of the left common femoral vein was performed under direct ultrasound guidance with a 21 gauge needle and micropuncture set. Venous access was dilated. A 24 cm, 13 cm triple-lumen Mahurkar catheter was then advanced over a guidewire under fluoroscopy. Catheter position was confirmed by a fluoroscopic image. All 3 lumens were flushed with saline. Larger bore lumens were then locked with heparin walls. The catheter was secured at the skin with Prolene retention sutures. FINDINGS: By ultrasound, the right internal jugular vein is abnormal and tapers to the point of severe stenosis/occlusion in the lower neck. There are multiple collateral veins visualized in the neck which communicate with the external jugular vein. The external jugular vein  is tortuous and can be seen entering the medial subclavian vein. Course of the external jugular vein was not favorable for non tunneled dialysis catheter placement. The left internal jugular vein is patent in the neck and demonstrates wall thickening and internal web formation consistent with prior access and potential prior partial thrombosis. After access of the vein, a guidewire would not advance across the midline of the chest roughly at the level of the mid left brachiocephalic vein. 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 Catheter placement via the left jugular vein was therefore abandoned. There are old dialysis grafts in both proximal thighs. There is calcified and shadowing subcutaneous graft material in both proximal thighs that affects sonographic visualization of the femoral veins. The left common femoral vein was better  visualized compared to the right. After access of a patent left common femoral vein, a non tunneled temporary dialysis catheter was able to be advanced with the catheter tip located in the left common iliac vein nearly at the iliac vein confluence/lower IVC. This catheter is ready for immediate use. IMPRESSION: 1. 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 Course of the right external jugular vein was not favorable for catheter placement. 2. 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 3. Successful placement of non tunneled temporary dialysis catheter via the left common femoral vein with the catheter tip located in the left common iliac vein nearly at the level of the iliac venous confluence. Electronically Signed   By: Aletta Edouard M.D.   On: 06/26/2021 13:45   IR Fluoro Guide CV Line Left  Result Date: 06/26/2021 INDICATION: Occluded dialysis access and need for temporary non tunneled dialysis catheter for immediate dialysis needs. EXAM: 1. ULTRASOUND GUIDANCE FOR VASCULAR ACCESS OF THE LEFT INTERNAL JUGULAR VEIN 2. LEFT JUGULAR VENOGRAPHY 3. ULTRASOUND GUIDANCE FOR VASCULAR ACCESS OF THE LEFT COMMON FEMORAL VEIN 4. PLACEMENT OF NON TUNNELED CENTRAL VENOUS CATHETER VIA LEFT COMMON FEMORAL VEIN WITH FLUOROSCOPIC GUIDANCE MEDICATIONS: None ANESTHESIA/SEDATION: None FLUOROSCOPY TIME:  Fluoroscopy Time: 2 minutes and 30 seconds. 43.0 mGy. CONTRAST:  10 mL Omnipaque [id number] COMPLICATIONS: None immediate. PROCEDURE: Informed written consent was obtained from the patient after a thorough discussion of the procedural risks, benefits and alternatives. All questions were addressed. Maximal Sterile Barrier Technique was utilized including caps, mask, sterile gowns, sterile gloves, sterile drape, hand hygiene and skin antiseptic. A timeout was performed prior to the  initiation of the procedure. Initial ultrasound was performed of both right and left neck regions to evaluate patency of the jugular veins. Patency of the left internal jugular vein was confirmed by ultrasound. The left neck was sterilely prepped with chlorhexidine. Local anesthesia was provided with 1% lidocaine. Under direct ultrasound guidance, the left internal jugular vein was punctured with a 21 gauge needle. A micro-access dilator was advanced over a guidewire. A 5 French catheter was then advanced over a guidewire to the level of the central veins. Contrast venography was performed through the 5 French catheter positioned at the level of the left brachiocephalic vein. The catheter was then removed and hemostasis obtained with manual compression over the jugular puncture site. Ultrasound was performed of both groin regions. Patency of the left  common femoral vein was confirmed by ultrasound. The left groin was prepped with chlorhexidine. Local anesthesia was provided with 1% lidocaine. Access of the left common femoral vein was performed under direct ultrasound guidance with a 21 gauge needle and micropuncture set. Venous access was dilated. A 24 cm, 13 cm triple-lumen Mahurkar catheter was then advanced over a guidewire under fluoroscopy. Catheter position was confirmed by a fluoroscopic image. All 3 lumens were flushed with saline. Larger bore lumens were then locked with heparin walls. The catheter was secured at the skin with Prolene retention sutures. FINDINGS: By ultrasound, the right internal jugular vein is abnormal and tapers to the point of severe stenosis/occlusion in the lower neck. There are multiple collateral veins visualized in the neck which communicate with the external jugular vein. The external jugular vein is tortuous and can be seen entering the medial subclavian vein. Course of the external jugular vein was not favorable for non tunneled dialysis catheter placement. The left internal  jugular vein is patent in the neck and demonstrates wall thickening and internal web formation consistent with prior access and potential prior partial thrombosis. After access of the vein, a guidewire would not advance across the midline of the chest roughly at the level of the mid left brachiocephalic vein. 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 Catheter placement via the left jugular vein was therefore abandoned. There are old dialysis grafts in both proximal thighs. There is calcified and shadowing subcutaneous graft material in both proximal thighs that affects sonographic visualization of the femoral veins. The left common femoral vein was better visualized compared to the right. After access of a patent left common femoral vein, a non tunneled temporary dialysis catheter was able to be advanced with the catheter tip located in the left common iliac vein nearly at the iliac vein confluence/lower IVC. This catheter is ready for immediate use. IMPRESSION: 1. 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 Course of the right external jugular vein was not favorable for catheter placement. 2. 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 3. Successful placement of non tunneled temporary dialysis catheter via the left common femoral vein with the catheter tip located in the left common iliac vein nearly at the level of the iliac venous confluence. Electronically Signed   By: Aletta Edouard M.D.   On: 06/26/2021 13:45   IR US Guide Vasc Access Left  Result Date: 06/26/2021 INDICATION: Occluded dialysis access and need for  temporary non tunneled dialysis catheter for immediate dialysis needs. EXAM: 1. ULTRASOUND GUIDANCE FOR VASCULAR ACCESS OF THE LEFT INTERNAL JUGULAR VEIN 2. LEFT JUGULAR VENOGRAPHY 3. ULTRASOUND GUIDANCE FOR VASCULAR ACCESS OF THE LEFT COMMON FEMORAL VEIN 4. PLACEMENT OF NON TUNNELED CENTRAL VENOUS CATHETER VIA LEFT COMMON FEMORAL VEIN WITH FLUOROSCOPIC GUIDANCE MEDICATIONS: None ANESTHESIA/SEDATION: None FLUOROSCOPY TIME:  Fluoroscopy Time: 2 minutes and 30 seconds. 43.0 mGy. CONTRAST:  10 mL Omnipaque [id number] COMPLICATIONS: None immediate. PROCEDURE: Informed written consent was obtained from the patient after a thorough discussion of the procedural risks, benefits and alternatives. All questions were addressed. Maximal Sterile Barrier Technique was utilized including caps, mask, sterile gowns, sterile gloves, sterile drape, hand hygiene and skin antiseptic. A timeout  was performed prior to the initiation of the procedure. Initial ultrasound was performed of both right and left neck regions to evaluate patency of the jugular veins. Patency of the left internal jugular vein was confirmed by ultrasound. The left neck was sterilely prepped with chlorhexidine. Local anesthesia was provided with 1% lidocaine. Under direct ultrasound guidance, the left internal jugular vein was punctured with a 21 gauge needle. A micro-access dilator was advanced over a guidewire. A 5 French catheter was then advanced over a guidewire to the level of the central veins. Contrast venography was performed through the 5 French catheter positioned at the level of the left brachiocephalic vein. The catheter was then removed and hemostasis obtained with manual compression over the jugular puncture site. Ultrasound was performed of both groin regions. Patency of the left common femoral vein was confirmed by ultrasound. The left groin was prepped with chlorhexidine. Local anesthesia was provided with 1% lidocaine. Access of the left common femoral  vein was performed under direct ultrasound guidance with a 21 gauge needle and micropuncture set. Venous access was dilated. A 24 cm, 13 cm triple-lumen Mahurkar catheter was then advanced over a guidewire under fluoroscopy. Catheter position was confirmed by a fluoroscopic image. All 3 lumens were flushed with saline. Larger bore lumens were then locked with heparin walls. The catheter was secured at the skin with Prolene retention sutures. FINDINGS: By ultrasound, the right internal jugular vein is abnormal and tapers to the point of severe stenosis/occlusion in the lower neck. There are multiple collateral veins visualized in the neck which communicate with the external jugular vein. The external jugular vein is tortuous and can be seen entering the medial subclavian vein. Course of the external jugular vein was not favorable for non tunneled dialysis catheter placement. The left internal jugular vein is patent in the neck and demonstrates wall thickening and internal web formation consistent with prior access and potential prior partial thrombosis. After access of the vein, a guidewire would not advance across the midline of the chest roughly at the level of the mid left brachiocephalic vein. 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 Catheter placement via the left jugular vein was therefore abandoned. There are old dialysis grafts in both proximal thighs. There is calcified and shadowing subcutaneous graft material in both proximal thighs that affects sonographic visualization of the femoral veins. The left common femoral vein was better visualized compared to the right. After access of a patent left common femoral vein, a non tunneled temporary dialysis catheter was able to be  advanced with the catheter tip located in the left common iliac vein nearly at the iliac vein confluence/lower IVC. This catheter is ready for immediate use. IMPRESSION: 1. 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 Course of the right external jugular vein was not favorable for catheter placement. 2. 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 3. Successful placement of non tunneled temporary dialysis catheter via the left common femoral vein with the catheter tip located in the left common iliac vein nearly at the level of the iliac venous confluence. Electronically Signed   By: Aletta Edouard M.D.   On: 06/26/2021 13:45    Scheduled Meds:  brimonidine  1 drop Right Eye BID   Chlorhexidine Gluconate Cloth  6 each Topical Q0600   LORazepam  0.25 mg Intravenous BID   ofloxacin  1 drop Right Eye QID   sodium chloride flush  3 mL Intravenous Q12H   Continuous Infusions:  sodium chloride     HYDROmorphone 0.5 mg/hr (06/27/21 1032)    LOS: 5 days   Alma Friendly, MD [facility] is on [place]  If 7PM-7AM, please contact night-coverage [email or web address]

## 2021-06-27 NOTE — Progress Notes (Addendum)
Patient ID: David [name], male   DOB: May 22, 1941, 80 y.o.   MRN: [id number]  S:  Seen in HD unit. Remains very lethargic. Had temp cath placed by IR yesterday. HR elevated to 130s before HD. Getting IV metoprolol now.     O:BP (!) 142/82 (BP Location: Left Arm)   Pulse (!) 121   Temp (!) 96.9 F (36.1 C) (Oral)   Resp (!) 22   Ht 6\' 2"  (1.88 m)   Wt 81.2 kg   SpO2 99%   BMI 22.98 kg/m   Intake/Output Summary (Last 24 hours) at 06/27/2021 0820 Last data filed at 06/27/2021 0400 Gross per 24 hour  Intake 415.01 ml  Output --  Net 415.01 ml     Gen: Chronically ill appearing, very lethargic  CVS: Irregular, no rub Resp: cta Abd: +BS, soft, NT/Nd Ext: no edema,  RUE AVG -T/B Access: L fem temp cath in place   Recent Labs  Lab 06/30/2021 1700 06/12/2021 2105 06/21/21 0814 06/21/21 1136 06/21/21 1742 06/22/21 0235 06/23/21 0349 06/24/21 0310 06/25/21 0247 06/26/21 0343  NA 137  --  137 137 138 138 139 137 136 138  K 5.4*  --  5.7* 5.4* 5.1 5.3* 5.2* 4.1 3.9 5.1  CL 93*  --  94*  --  94* 93* 94* 94* 92* 91*  CO2 27  --  28  --  26 27 22 26 24  21*  GLUCOSE 114*  --  99  --  94 126* 120* 123* 110* 143*  BUN 56*  --  75*  --  44* 44* 74* 65* 87* 107*  CREATININE 9.26*   < > 10.68*  --  7.23* 7.51* 9.33* 7.75* 9.60* 11.44*  ALBUMIN 3.2*  --  2.9*  --  3.5 3.1* 2.7* 2.6* 2.4* 2.6*  CALCIUM 8.9  --  8.9  --  9.5 9.6 9.5 9.1 9.3 10.1  PHOS 8.5*  --  10.3*  --  7.7* 8.2* 10.4* 7.7* 9.0* >12.0*  AST 29  --  24  --  42* 51*  --   --   --   --   ALT 7  --  6  --  12 13  --   --   --   --    < > = values in this interval not displayed.    Liver Function Tests: Recent Labs  Lab 06/21/21 0814 06/21/21 1742 06/22/21 0235 06/23/21 0349 06/24/21 0310 06/25/21 0247 06/26/21 0343  AST 24 42* 51*  --   --   --   --   ALT 6 12 13   --   --   --   --   ALKPHOS 105 126 122  --   --   --   --   BILITOT 1.1 1.7* 1.0  --   --   --   --   PROT 6.4* 8.2* 7.6  --   --   --   --    ALBUMIN 2.9* 3.5 3.1*   < > 2.6* 2.4* 2.6*   < > = values in this interval not displayed.    No results for input(s): LIPASE, AMYLASE in the last 168 hours. Recent Labs  Lab 07/01/2021 1700  AMMONIA 31    CBC: Recent Labs  Lab 06/21/21 0814 06/21/21 1136 06/21/21 1843 06/22/21 0235 06/23/21 0349 06/24/21 0310 06/25/21 0247 06/26/21 0343  WBC 10.3  --  17.4* 16.6* 13.1* 9.7 11.2* 18.2*  NEUTROABS 7.9*  --  15.2* 13.4*  --   --   --   --   HGB 9.8*   < > 12.1* 12.2* 11.1* 9.6* 9.6* 11.4*  HCT 29.6*   < > 37.8* 36.5* 32.1* 28.4* 28.6* 33.7*  MCV 108.4*  --  110.5* 105.5* 102.9* 102.2* 102.5* 100.9*  PLT 260  --  214 272 276 276 263 346   < > = values in this interval not displayed.    Cardiac Enzymes: No results for input(s): CKTOTAL, CKMB, CKMBINDEX, TROPONINI in the last 168 hours. CBG: Recent Labs  Lab 06/21/2021 1529 06/16/2021 2212 06/21/21 0831 06/26/21 0832  GLUCAP 116* 111* 110* 131*     Iron Studies: No results for input(s): IRON, TIBC, TRANSFERRIN, FERRITIN in the last 72 hours. Studies/Results: IR Veno/Jugular Left  Result Date: 06/26/2021 INDICATION: Occluded dialysis access and need for temporary non tunneled dialysis catheter for immediate dialysis needs. EXAM: 1. ULTRASOUND GUIDANCE FOR VASCULAR ACCESS OF THE LEFT INTERNAL JUGULAR VEIN 2. LEFT JUGULAR VENOGRAPHY 3. ULTRASOUND GUIDANCE FOR VASCULAR ACCESS OF THE LEFT COMMON FEMORAL VEIN 4. PLACEMENT OF NON TUNNELED CENTRAL VENOUS CATHETER VIA LEFT COMMON FEMORAL VEIN WITH FLUOROSCOPIC GUIDANCE MEDICATIONS: None ANESTHESIA/SEDATION: None FLUOROSCOPY TIME:  Fluoroscopy Time: 2 minutes and 30 seconds. 43.0 mGy. CONTRAST:  10 mL Omnipaque [id number] COMPLICATIONS: None immediate. PROCEDURE: Informed written consent was obtained from the patient after a thorough discussion of the procedural risks, benefits and alternatives. All questions were addressed. Maximal Sterile Barrier Technique was utilized including caps, mask,  sterile gowns, sterile gloves, sterile drape, hand hygiene and skin antiseptic. A timeout was performed prior to the initiation of the procedure. Initial ultrasound was performed of both right and left neck regions to evaluate patency of the jugular veins. Patency of the left internal jugular vein was confirmed by ultrasound. The left neck was sterilely prepped with chlorhexidine. Local anesthesia was provided with 1% lidocaine. Under direct ultrasound guidance, the left internal jugular vein was punctured with a 21 gauge needle. A micro-access dilator was advanced over a guidewire. A 5 French catheter was then advanced over a guidewire to the level of the central veins. Contrast venography was performed through the 5 French catheter positioned at the level of the left brachiocephalic vein. The catheter was then removed and hemostasis obtained with manual compression over the jugular puncture site. Ultrasound was performed of both groin regions. Patency of the left common femoral vein was confirmed by ultrasound. The left groin was prepped with chlorhexidine. Local anesthesia was provided with 1% lidocaine. Access of the left common femoral vein was performed under direct ultrasound guidance with a 21 gauge needle and micropuncture set. Venous access was dilated. A 24 cm, 13 cm triple-lumen Mahurkar catheter was then advanced over a guidewire under fluoroscopy. Catheter position was confirmed by a fluoroscopic image. All 3 lumens were flushed with saline. Larger bore lumens were then locked with heparin walls. The catheter was secured at the skin with Prolene retention sutures. FINDINGS: By ultrasound, the right internal jugular vein is abnormal and tapers to the point of severe stenosis/occlusion in the lower neck. There are multiple collateral veins visualized in the neck which communicate with the external jugular vein. The external jugular vein is tortuous and can be seen entering the medial subclavian vein.  Course of the external jugular vein was not favorable for non tunneled dialysis catheter placement. The left internal jugular vein is patent in the neck and demonstrates wall thickening and internal web formation consistent with prior access and  potential prior partial thrombosis. After access of the vein, a guidewire would not advance across the midline of the chest roughly at the level of the mid left brachiocephalic vein. 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 Catheter placement via the left jugular vein was therefore abandoned. There are old dialysis grafts in both proximal thighs. There is calcified and shadowing subcutaneous graft material in both proximal thighs that affects sonographic visualization of the femoral veins. The left common femoral vein was better visualized compared to the right. After access of a patent left common femoral vein, a non tunneled temporary dialysis catheter was able to be advanced with the catheter tip located in the left common iliac vein nearly at the iliac vein confluence/lower IVC. This catheter is ready for immediate use. IMPRESSION: 1. 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 Course of the right external jugular vein was not favorable for catheter placement. 2. 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 3. Successful placement of non tunneled temporary dialysis catheter via the left common femoral vein with the catheter tip located in the left common iliac vein nearly at the level of the iliac venous confluence. Electronically Signed   By: Aletta Edouard M.D.   On: 06/26/2021 13:45    IR Fluoro Guide CV Line Left  Result Date: 06/26/2021 INDICATION: Occluded dialysis access and need for temporary non tunneled dialysis catheter for immediate dialysis needs. EXAM: 1. ULTRASOUND GUIDANCE FOR VASCULAR ACCESS OF THE LEFT INTERNAL JUGULAR VEIN 2. LEFT JUGULAR VENOGRAPHY 3. ULTRASOUND GUIDANCE FOR VASCULAR ACCESS OF THE LEFT COMMON FEMORAL VEIN 4. PLACEMENT OF NON TUNNELED CENTRAL VENOUS CATHETER VIA LEFT COMMON FEMORAL VEIN WITH FLUOROSCOPIC GUIDANCE MEDICATIONS: None ANESTHESIA/SEDATION: None FLUOROSCOPY TIME:  Fluoroscopy Time: 2 minutes and 30 seconds. 43.0 mGy. CONTRAST:  10 mL Omnipaque [id number] COMPLICATIONS: None immediate. PROCEDURE: Informed written consent was obtained from the patient after a thorough discussion of the procedural risks, benefits and alternatives. All questions were addressed. Maximal Sterile Barrier Technique was utilized including caps, mask, sterile gowns, sterile gloves, sterile drape, hand hygiene and skin antiseptic. A timeout was performed prior to the initiation of the procedure. Initial ultrasound was performed of both right and left neck regions to evaluate patency of the jugular veins. Patency of the left internal jugular vein was confirmed by ultrasound. The left neck was sterilely prepped with chlorhexidine. Local anesthesia was provided with 1% lidocaine. Under direct ultrasound guidance, the left internal jugular vein was punctured with a 21 gauge needle. A micro-access dilator was advanced over a guidewire. A 5 French catheter was then advanced over a guidewire to the level of the central veins. Contrast venography was performed through the 5 French catheter positioned at the level of the left brachiocephalic vein. The catheter was then removed and hemostasis obtained with manual compression over the jugular puncture site. Ultrasound was performed of both groin regions. Patency of the left common femoral vein was confirmed by ultrasound. The left groin was  prepped with chlorhexidine. Local anesthesia was provided with 1% lidocaine. Access of the left common femoral vein was performed under direct ultrasound guidance with a 21 gauge needle and micropuncture set. Venous access was dilated. A 24 cm,  13 cm triple-lumen Mahurkar catheter was then advanced over a guidewire under fluoroscopy. Catheter position was confirmed by a fluoroscopic image. All 3 lumens were flushed with saline. Larger bore lumens were then locked with heparin walls. The catheter was secured at the skin with Prolene retention sutures. FINDINGS: By ultrasound, the right internal jugular vein is abnormal and tapers to the point of severe stenosis/occlusion in the lower neck. There are multiple collateral veins visualized in the neck which communicate with the external jugular vein. The external jugular vein is tortuous and can be seen entering the medial subclavian vein. Course of the external jugular vein was not favorable for non tunneled dialysis catheter placement. The left internal jugular vein is patent in the neck and demonstrates wall thickening and internal web formation consistent with prior access and potential prior partial thrombosis. After access of the vein, a guidewire would not advance across the midline of the chest roughly at the level of the mid left brachiocephalic vein. 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 Catheter placement via the left jugular vein was therefore abandoned. There are old dialysis grafts in both proximal thighs. There is calcified and shadowing subcutaneous graft material in both proximal thighs that affects sonographic visualization of the femoral veins. The left common femoral vein was better visualized compared to the  right. After access of a patent left common femoral vein, a non tunneled temporary dialysis catheter was able to be advanced with the catheter tip located in the left common iliac vein nearly at the iliac vein confluence/lower IVC. This catheter is ready for immediate use. IMPRESSION: 1. 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 Course of the right external jugular vein was not favorable for catheter placement. 2. 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 3. Successful placement of non tunneled temporary dialysis catheter via the left common femoral vein with the catheter tip located in the left common iliac vein nearly at the level of the iliac venous confluence. Electronically Signed   By: Aletta Edouard M.D.   On: 06/26/2021 13:45   IR US Guide Vasc Access Left  Result Date: 06/26/2021 INDICATION: Occluded dialysis access and need for temporary non tunneled dialysis catheter for immediate dialysis needs. EXAM: 1. ULTRASOUND GUIDANCE FOR VASCULAR ACCESS OF THE LEFT INTERNAL JUGULAR VEIN 2. LEFT JUGULAR VENOGRAPHY 3. ULTRASOUND GUIDANCE FOR VASCULAR ACCESS OF THE LEFT COMMON FEMORAL VEIN 4. PLACEMENT OF NON TUNNELED CENTRAL VENOUS CATHETER VIA LEFT COMMON FEMORAL VEIN WITH FLUOROSCOPIC GUIDANCE MEDICATIONS: None ANESTHESIA/SEDATION: None FLUOROSCOPY TIME:  Fluoroscopy Time: 2 minutes and 30 seconds. 43.0 mGy. CONTRAST:  10 mL Omnipaque [id number] COMPLICATIONS: None immediate. PROCEDURE: Informed written consent was obtained from the patient after a thorough discussion of the procedural risks, benefits and alternatives. All questions were addressed. Maximal Sterile Barrier Technique was utilized including caps, mask, sterile gowns, sterile gloves, sterile drape, hand hygiene and skin antiseptic. A timeout was performed prior to the initiation of the procedure.  Initial ultrasound was performed of both right and left neck regions to evaluate patency of the jugular veins. Patency of the left internal jugular vein was confirmed by ultrasound. The left neck was sterilely prepped with chlorhexidine. Local anesthesia was  provided with 1% lidocaine. Under direct ultrasound guidance, the left internal jugular vein was punctured with a 21 gauge needle. A micro-access dilator was advanced over a guidewire. A 5 French catheter was then advanced over a guidewire to the level of the central veins. Contrast venography was performed through the 5 French catheter positioned at the level of the left brachiocephalic vein. The catheter was then removed and hemostasis obtained with manual compression over the jugular puncture site. Ultrasound was performed of both groin regions. Patency of the left common femoral vein was confirmed by ultrasound. The left groin was prepped with chlorhexidine. Local anesthesia was provided with 1% lidocaine. Access of the left common femoral vein was performed under direct ultrasound guidance with a 21 gauge needle and micropuncture set. Venous access was dilated. A 24 cm, 13 cm triple-lumen Mahurkar catheter was then advanced over a guidewire under fluoroscopy. Catheter position was confirmed by a fluoroscopic image. All 3 lumens were flushed with saline. Larger bore lumens were then locked with heparin walls. The catheter was secured at the skin with Prolene retention sutures. FINDINGS: By ultrasound, the right internal jugular vein is abnormal and tapers to the point of severe stenosis/occlusion in the lower neck. There are multiple collateral veins visualized in the neck which communicate with the external jugular vein. The external jugular vein is tortuous and can be seen entering the medial subclavian vein. Course of the external jugular vein was not favorable for non tunneled dialysis catheter placement. The left internal jugular vein is patent in the  neck and demonstrates wall thickening and internal web formation consistent with prior access and potential prior partial thrombosis. After access of the vein, a guidewire would not advance across the midline of the chest roughly at the level of the mid left brachiocephalic vein. 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 Catheter placement via the left jugular vein was therefore abandoned. There are old dialysis grafts in both proximal thighs. There is calcified and shadowing subcutaneous graft material in both proximal thighs that affects sonographic visualization of the femoral veins. The left common femoral vein was better visualized compared to the right. After access of a patent left common femoral vein, a non tunneled temporary dialysis catheter was able to be advanced with the catheter tip located in the left common iliac vein nearly at the iliac vein confluence/lower IVC. This catheter is ready for immediate use. IMPRESSION: 1. 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 Course of the right external jugular vein was not favorable for catheter placement. 2. 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 3. Successful placement of non tunneled temporary dialysis catheter via the left common femoral vein with the catheter tip located in the left common iliac vein nearly at the level of the iliac venous confluence. Electronically Signed   By: Aletta Edouard M.D.   On: 06/26/2021 13:45    metoprolol tartrate       (feeding supplement) PROSource Plus  30 mL Oral TID BM   allopurinol  100 mg Oral Once per day on Mon Tue Thu Sat    amiodarone  200 mg Oral Daily   bictegravir-emtricitabine-tenofovir AF  1 tablet Oral Daily  brimonidine  1 drop Right Eye BID   Chlorhexidine Gluconate Cloth  6 each Topical Q0600   metoprolol tartrate  2.5-5 mg Intravenous Once in dialysis   midodrine  10 mg Oral 2 times per day on Tue Thu Sat   midodrine  10 mg Oral Once per day on Sun Mon Wed Fri   montelukast  10 mg Oral QHS   multivitamin  1 tablet Oral QPM   ofloxacin  1 drop Right Eye QID   pantoprazole  40 mg Oral Daily   sucroferric oxyhydroxide  1,000 mg Oral TID with meals    BMET    Component Value Date/Time   NA 138 06/26/2021 0343   NA 142 04/24/2018 1641   K 5.1 06/26/2021 0343   CL 91 (L) 06/26/2021 0343   CO2 21 (L) 06/26/2021 0343   GLUCOSE 143 (H) 06/26/2021 0343   BUN 107 (H) 06/26/2021 0343   BUN 31 (H) 04/24/2018 1641   CREATININE 11.44 (H) 06/26/2021 0343   CREATININE 7.71 (H) 03/19/2019 1140   CALCIUM 10.1 06/26/2021 0343   CALCIUM 8.6 03/14/2016 0925   GFRNONAA 4 (L) 06/26/2021 0343   GFRNONAA 6 (L) 03/19/2019 1140   GFRAA 8 (L) 05/18/2020 0614   GFRAA 7 (L) 03/19/2019 1140   CBC    Component Value Date/Time   WBC 18.2 (H) 06/26/2021 0343   RBC 3.34 (L) 06/26/2021 0343   HGB 11.4 (L) 06/26/2021 0343   HGB 11.1 (L) 03/06/2013 1138   HCT 33.7 (L) 06/26/2021 0343   HCT 34.3 (L) 03/12/2019 0358   HCT 33.8 (L) 03/06/2013 1138   PLT 346 06/26/2021 0343   PLT 177 03/06/2013 1138   MCV 100.9 (H) 06/26/2021 0343   MCV 101.4 (A) 04/24/2018 1742   MCV 95 03/06/2013 1138   MCH 34.1 (H) 06/26/2021 0343   MCHC 33.8 06/26/2021 0343   RDW 15.0 06/26/2021 0343   RDW 13.4 03/06/2013 1138   LYMPHSABS 0.8 06/22/2021 0235   LYMPHSABS 1.5 03/06/2013 1138   MONOABS 2.3 (H) 06/22/2021 0235   EOSABS 0.0 06/22/2021 0235   EOSABS 0.3 03/06/2013 1138   BASOSABS 0.0 06/22/2021 0235   BASOSABS 0.0 03/06/2013 1138   Dialysis Orders:  MTTS at [facility] 4hr, 400/A1.5, EDW 82kg, 2K/2Ca, UFP #2, AVG, heparin 2000  bolus - Hectoral 85mcg IV q HD - Venofer 50mg  IV weekly - Mircera 49mcg IV q 2 weeks   Assessment/Plan:  Acute encephalopathy: CT/MRI without acute findings. Multiple contributing factors: UTI, missed dialysis. Has not returned to baseline. Neuro consulted: No further w/u recommended. Palliative consulted.  UTI:  Urine culture + Acinetobacter. On Unasyn. Per primary.    ESRD:  Will plan to keep on his MTThS schedule. If any component of over-medication contributing to current state, hopefully will slightly improve with dialysis. No significant improvement with dialysis.  Last dialysis 7/21. AVG clotted 7/23. L fem temp cath placed 7/24. Appreciate IR assistance. HD today.  Hyperkalemia -  K+ 5.1 --Will correct with dialysis today.   Hypotension/volume: Chronically low BP (on midodrine) and with ^ BNP - UF as tolerated.  below his edw and unable to tolerate UF with HD Thursday due to low bp.  Continue with midodrine and UF as tolerated.  Appears euvolemic.   Anemia: Hgb 11.4 No ESA needs currently.   Metabolic bone disease: Ca ok, Phos very high - unclear why. Has Velphoro binder ordered (probably difficulty swallowing). Remains NPO.  Follow when he resumes diet.  Nutrition:  Alb very low. Getting prot supplement   HFrEF: EF 20-25%  Cards has signed off. UF as tolerated.   HIV: Continue home meds.  A-fib: On Eliquis, amiodarone.   [name] PA-C [facility] 06/27/2021,8:20 AM  Pt seen, examined and agree w A/P as above.  Kelly Splinter  MD 06/27/2021, 3:36 PM

## 2021-06-27 NOTE — Progress Notes (Signed)
Dialysis Nurse Note  Unable to dialyze pt dt rapid afib (HR 140's - 150's). Had originally notified Dr. Jonnie Finner of pt status when pt first came to HD. Received orders for 2.5mg  metoprolol IVP, repeat in 20 minutes if no decrease in HR).   Informed Dr. Lorrin Jackson re no response to metoprolol 10-15 min after 2nd dose.  No HD at this time per Dr. Jonnie Finner. Accompanied pt with transport back to floor- gave report to bedside nurse prior to transfer.

## 2021-06-27 NOTE — Plan of Care (Signed)

## 2021-06-27 NOTE — Social Work (Signed)
CSW spoke with pt spouse about pt plan of placement, pt spouse stated she was not sure of place but she will meet with the doctors at 11:30 and will make a final decision after. Pt spouse seemed a little upset during conversation and CSW suggested following up after the meeting to give pt a moment before she met with the doctors. CSW is in communication with palliative care team and will follow up.  CSW was notified pt will be transferred to 6N, CSW no longer following.

## 2021-06-29 ENCOUNTER — Ambulatory Visit: Payer: Medicare HMO

## 2021-07-04 NOTE — Death Summary Note (Signed)
DEATH SUMMARY   Patient Details  Name: David [name] MRN: [id number] DOB: 05-12-1941  Admission/Discharge Information   Admit Date:  2021/07/20  Date of Death: Date of Death: 28-Jul-2021  Time of Death: Time of Death: 0729  Length of Stay: 03-09-23  Referring Physician: No primary care provider on file.   Reason(s) for Hospitalization  Generalized weakness and nausea  Diagnoses  Preliminary cause of death:   Congestive heart failure, failure to thrive  Secondary Diagnoses (including complications and co-morbidities):  Active Problems:   ESRD (end stage renal disease) on dialysis ([place])   Acute on chronic systolic CHF (congestive heart failure), NYHA class 4 (HCC)   Atrial fibrillation with RVR (HCC)   Generalized weakness   A-fib (HCC)   Acute metabolic encephalopathy   Brief Hospital Course (including significant findings, care, treatment, and services provided and events leading to death)  David [name] is a 80 y.o. year old male with a past medical history significant for but not limited to ESRD on hemodialysis M/T/T/S, Afib on AC, history of gout, chronic hypotension, GERD, recent admission for right hip fracture status post repair presented from his SNF for generalized weakness and nausea.  Work-up in the ED revealed electrolyte disturbances, elevated BNP greater than 4000, mild pulmonary edema with concern for acute CHF and elevated troponin. Patient was unable to provide a subjective history due to his lethargy.  He underwent head imaging with a head CT scan as well as a MRI.  Head CT done showed no acute intracranial findings but did show chronic microvascular ischemic changes and cerebral volume loss.  MRI done showed no acute intracranial abnormality and showed grade generalized volume loss and findings of chronic microvascular ischemia. On 7/19, after HD, was found to be in A. fib with RVR, cardiology consulted.  Patient admitted for further management.  Patient continued  to remain lethargic, encephalopathic, with very poor oral intake and failure to thrive.  Palliative care was consulted.  Multiple discussions with wife and niece were held, about very poor prognosis and failure to thrive.  Family agreed to switch patient to DNR and comfort care on 2021/07/27.  Patient passed away on July 28, 2021 at 7:29 AM.   Acute metabolic encephalopathy UTI Failure to thrive, poor oral intake- progressed to comfort care Noted 100.8 fever on 06/21/2021 UA with large leukocytes, greater than 50 WBC UC growing >100,000 Acinetobacter calcoaceticus/baumannii complex, s/p Unasyn BC x2 NGTD Chest x-ray unremarkable for any infection Procalcitonin elevated at 29 Head CT and MRI brain done showed no acute intracranial findings but did show chronic microvascular ischemic changes and cerebral volume loss.   Ammonia level WNL, RPR has noted to be reactive in the past EEG showed no seizures or epileptiform discharges, noted to have moderate diffuse encephalopathy which is nonspecific Neurology consulted, no further recommendation Switched to comfort care   Generalized weakness PT/OT consulted, progressed to comfort care   NICM Chronic Combined Systolic and Diastolic CHF EF with 20 to 25% Cardiology consulted, signed off Volume Maintenance was per Dialysis   ESRD on HD TTS Hyperphosphatemia Hyperkalemia Nephrology consulted Pt unable to dialyze since 06/25/2021, due to overall poor clinical status as well as clotted graft.  Subsequently a TDC was placed by IR on 06/26/2021 .  HD was attempted on 07-27-21 but unable to dialyze as patient remained in A. fib with RVR and heart rates in the 150s despite IV metoprolol Patient was no longer a dialysis candidate, switched to comfort care   Persistent  A. 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 Left dialysis catheter has been removed in the interval. Lungs are clear without evidence of focal infiltrate or effusion. No pneumothorax is noted. No acute bony abnormality is seen. Vascular stenting is again noted right arm. IMPRESSION: Stable cardiomegaly.  No acute abnormality noted. Electronically Signed   By: Inez Catalina M.D.   On: 06/10/2021 19:42   CT Head Wo Contrast  Result Date: 06/03/2021 CLINICAL DATA:  Altered mental status EXAM: CT HEAD WITHOUT CONTRAST TECHNIQUE: Contiguous axial images were obtained from the base of the skull through the vertex without intravenous contrast. COMPARISON:  09/05/2019 FINDINGS: Brain: No evidence of acute infarction, hemorrhage, hydrocephalus, extra-axial collection or mass lesion/mass effect. Mild-moderate low-density changes within the periventricular and subcortical white matter compatible with chronic microvascular ischemic change. Mild diffuse cerebral volume loss with stable size and configuration of the ventricles. Vascular: No hyperdense vessel or unexpected calcification. Skull: Normal. Negative for fracture or focal lesion. Sinuses/Orbits: No acute finding. Other: None. IMPRESSION: 1. No acute  intracranial findings. 2. Chronic microvascular ischemic change and cerebral volume loss. Electronically Signed   By: Davina Poke D.O.   On: 06/29/2021 17:04   MR Brain Wo Contrast (neuro protocol)  Result Date: 06/08/2021 CLINICAL DATA:  Encephalopathy EXAM: MRI HEAD WITHOUT CONTRAST TECHNIQUE: Multiplanar, multiecho pulse sequences of the brain and surrounding structures were obtained without intravenous contrast. COMPARISON:  01/07/2019 FINDINGS: Brain: No acute infarct, mass effect or extra-axial collection. No acute or chronic hemorrhage. Hyperintense T2-weighted signal is moderately widespread throughout the white matter. Generalized volume loss without a clear lobar predilection. The midline structures are normal. Vascular: Major flow voids are preserved. Skull and upper cervical spine: Normal calvarium and skull base. Visualized upper cervical spine and soft tissues are normal. Sinuses/Orbits:No paranasal sinus fluid levels or advanced mucosal thickening. No mastoid or middle ear effusion. Normal orbits. IMPRESSION: 1. No acute intracranial abnormality. 2. Generalized volume loss and findings of chronic microvascular ischemia. Electronically Signed   By: Ulyses Jarred M.D.   On: 06/17/2021 23:20   IR Veno/Jugular Left  Result Date: 06/26/2021 INDICATION: Occluded dialysis access and need for temporary non tunneled dialysis catheter for immediate dialysis needs. EXAM: 1. ULTRASOUND GUIDANCE FOR VASCULAR ACCESS OF THE LEFT INTERNAL JUGULAR VEIN 2. LEFT JUGULAR VENOGRAPHY 3. ULTRASOUND GUIDANCE FOR VASCULAR ACCESS OF THE LEFT COMMON FEMORAL VEIN 4. PLACEMENT OF NON TUNNELED CENTRAL VENOUS CATHETER VIA LEFT COMMON FEMORAL VEIN WITH FLUOROSCOPIC GUIDANCE MEDICATIONS: None ANESTHESIA/SEDATION: None FLUOROSCOPY TIME:  Fluoroscopy Time: 2 minutes and 30 seconds. 43.0 mGy. CONTRAST:  10 mL Omnipaque [id number] COMPLICATIONS: None immediate. PROCEDURE: Informed written consent was obtained from  the patient after  a thorough discussion of the procedural risks, benefits and alternatives. All questions were addressed. Maximal Sterile Barrier Technique was utilized including caps, mask, sterile gowns, sterile gloves, sterile drape, hand hygiene and skin antiseptic. A timeout was performed prior to the initiation of the procedure. Initial ultrasound was performed of both right and left neck regions to evaluate patency of the jugular veins. Patency of the left internal jugular vein was confirmed by ultrasound. The left neck was sterilely prepped with chlorhexidine. Local anesthesia was provided with 1% lidocaine. Under direct ultrasound guidance, the left internal jugular vein was punctured with a 21 gauge needle. A micro-access dilator was advanced over a guidewire. A 5 French catheter was then advanced over a guidewire to the level of the central veins. Contrast venography was performed through the 5 French catheter positioned at the level of the left brachiocephalic vein. The catheter was then removed and hemostasis obtained with manual compression over the jugular puncture site. Ultrasound was performed of both groin regions. Patency of the left common femoral vein was confirmed by ultrasound. The left groin was prepped with chlorhexidine. Local anesthesia was provided with 1% lidocaine. Access of the left common femoral vein was performed under direct ultrasound guidance with a 21 gauge needle and micropuncture set. Venous access was dilated. A 24 cm, 13 cm triple-lumen Mahurkar catheter was then advanced over a guidewire under fluoroscopy. Catheter position was confirmed by a fluoroscopic image. All 3 lumens were flushed with saline. Larger bore lumens were then locked with heparin walls. The catheter was secured at the skin with Prolene retention sutures. FINDINGS: By ultrasound, the right internal jugular vein is abnormal and tapers to the point of severe stenosis/occlusion in the lower neck. There are multiple collateral  veins visualized in the neck which communicate with the external jugular vein. The external jugular vein is tortuous and can be seen entering the medial subclavian vein. Course of the external jugular vein was not favorable for non tunneled dialysis catheter placement. The left internal jugular vein is patent in the neck and demonstrates wall thickening and internal web formation consistent with prior access and potential prior partial thrombosis. After access of the vein, a guidewire would not advance across the midline of the chest roughly at the level of the mid left brachiocephalic vein. 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 Catheter placement via the left jugular vein was therefore abandoned. There are old dialysis grafts in both proximal thighs. There is calcified and shadowing subcutaneous graft material in both proximal thighs that affects sonographic visualization of the femoral veins. The left common femoral vein was better visualized compared to the right. After access of a patent left common femoral vein, a non tunneled temporary dialysis catheter was able to be advanced with the catheter tip located in the left common iliac vein nearly at the iliac vein confluence/lower IVC. This catheter is ready for immediate use. IMPRESSION: 1. 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 Course of the right external jugular vein was not favorable for catheter placement. 2. 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 3. Successful placement of non tunneled temporary dialysis catheter via the left common femoral vein  with the  catheter tip located in the left common iliac vein nearly at the level of the iliac venous confluence. Electronically Signed   By: Aletta Edouard M.D.   On: 06/26/2021 13:45   IR Fluoro Guide CV Line Left  Result Date: 06/26/2021 INDICATION: Occluded dialysis access and need for temporary non tunneled dialysis catheter for immediate dialysis needs. EXAM: 1. ULTRASOUND GUIDANCE FOR VASCULAR ACCESS OF THE LEFT INTERNAL JUGULAR VEIN 2. LEFT JUGULAR VENOGRAPHY 3. ULTRASOUND GUIDANCE FOR VASCULAR ACCESS OF THE LEFT COMMON FEMORAL VEIN 4. PLACEMENT OF NON TUNNELED CENTRAL VENOUS CATHETER VIA LEFT COMMON FEMORAL VEIN WITH FLUOROSCOPIC GUIDANCE MEDICATIONS: None ANESTHESIA/SEDATION: None FLUOROSCOPY TIME:  Fluoroscopy Time: 2 minutes and 30 seconds. 43.0 mGy. CONTRAST:  10 mL Omnipaque [id number] COMPLICATIONS: None immediate. PROCEDURE: Informed written consent was obtained from the patient after a thorough discussion of the procedural risks, benefits and alternatives. All questions were addressed. Maximal Sterile Barrier Technique was utilized including caps, mask, sterile gowns, sterile gloves, sterile drape, hand hygiene and skin antiseptic. A timeout was performed prior to the initiation of the procedure. Initial ultrasound was performed of both right and left neck regions to evaluate patency of the jugular veins. Patency of the left internal jugular vein was confirmed by ultrasound. The left neck was sterilely prepped with chlorhexidine. Local anesthesia was provided with 1% lidocaine. Under direct ultrasound guidance, the left internal jugular vein was punctured with a 21 gauge needle. A micro-access dilator was advanced over a guidewire. A 5 French catheter was then advanced over a guidewire to the level of the central veins. Contrast venography was performed through the 5 French catheter positioned at the level of the left brachiocephalic vein. The catheter was then removed and hemostasis obtained with manual  compression over the jugular puncture site. Ultrasound was performed of both groin regions. Patency of the left common femoral vein was confirmed by ultrasound. The left groin was prepped with chlorhexidine. Local anesthesia was provided with 1% lidocaine. Access of the left common femoral vein was performed under direct ultrasound guidance with a 21 gauge needle and micropuncture set. Venous access was dilated. A 24 cm, 13 cm triple-lumen Mahurkar catheter was then advanced over a guidewire under fluoroscopy. Catheter position was confirmed by a fluoroscopic image. All 3 lumens were flushed with saline. Larger bore lumens were then locked with heparin walls. The catheter was secured at the skin with Prolene retention sutures. FINDINGS: By ultrasound, the right internal jugular vein is abnormal and tapers to the point of severe stenosis/occlusion in the lower neck. There are multiple collateral veins visualized in the neck which communicate with the external jugular vein. The external jugular vein is tortuous and can be seen entering the medial subclavian vein. Course of the external jugular vein was not favorable for non tunneled dialysis catheter placement. The left internal jugular vein is patent in the neck and demonstrates wall thickening and internal web formation consistent with prior access and potential prior partial thrombosis. After access of the vein, a guidewire would not advance across the midline of the chest roughly at the level of the mid left brachiocephalic vein. 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 Catheter placement via the left jugular vein was therefore abandoned. There are old dialysis grafts in both proximal thighs. There is calcified and shadowing  subcutaneous graft material in both proximal thighs that affects sonographic visualization of the femoral veins. The left common femoral vein was better visualized compared to the right. After access of a patent left common femoral vein, a non tunneled temporary dialysis catheter was able to be advanced with the catheter tip located in the left common iliac vein nearly at the iliac vein confluence/lower IVC. This catheter is ready for immediate use. IMPRESSION: 1. 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 Course of the right external jugular vein was not favorable for catheter placement. 2. 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 3. Successful placement of non tunneled temporary dialysis catheter via the left common femoral vein with the catheter tip located in the left common iliac vein nearly at the level of the iliac venous confluence. Electronically Signed   By: Aletta Edouard M.D.   On: 06/26/2021 13:45   IR US Guide Vasc Access Left  Result Date: 06/26/2021 INDICATION: Occluded dialysis access and need for temporary non tunneled dialysis catheter for immediate dialysis needs. EXAM: 1. ULTRASOUND GUIDANCE FOR VASCULAR ACCESS OF THE LEFT INTERNAL JUGULAR VEIN 2. LEFT JUGULAR VENOGRAPHY 3. ULTRASOUND GUIDANCE FOR VASCULAR ACCESS OF THE LEFT COMMON FEMORAL VEIN 4. PLACEMENT OF NON TUNNELED CENTRAL VENOUS CATHETER VIA LEFT COMMON FEMORAL VEIN WITH FLUOROSCOPIC GUIDANCE MEDICATIONS: None ANESTHESIA/SEDATION: None FLUOROSCOPY TIME:  Fluoroscopy Time: 2 minutes and 30 seconds. 43.0 mGy. CONTRAST:  10 mL Omnipaque [id number] COMPLICATIONS: None immediate. PROCEDURE: Informed written consent was obtained from the patient after a thorough discussion of the procedural risks, benefits and alternatives. All questions were addressed. Maximal Sterile Barrier Technique  was utilized including caps, mask, sterile gowns, sterile gloves, sterile drape, hand hygiene and skin antiseptic. A timeout was performed prior to the initiation of the procedure. Initial ultrasound was performed of both right and left neck regions to evaluate patency of the jugular veins. Patency of the left internal jugular vein was confirmed by ultrasound. The left neck was sterilely prepped with chlorhexidine. Local anesthesia was provided with 1% lidocaine. Under direct ultrasound guidance, the left internal jugular vein was punctured with a 21 gauge needle. A micro-access dilator was advanced over a guidewire. A 5 French catheter was then advanced over a guidewire to the level of the central veins. Contrast venography was performed through the 5 French catheter positioned at the level of the left brachiocephalic vein. The catheter was then removed and hemostasis obtained with manual compression over the jugular puncture site. Ultrasound was performed of both groin regions. Patency of the left common femoral vein was confirmed by ultrasound. The left groin was prepped with chlorhexidine. Local anesthesia was provided with 1% lidocaine. Access of the left common femoral vein was performed under direct ultrasound guidance with a 21 gauge needle and micropuncture set. Venous access was dilated. A 24 cm, 13 cm triple-lumen Mahurkar catheter was then advanced over a guidewire under fluoroscopy. Catheter position was confirmed by a fluoroscopic image. All 3 lumens were flushed with saline. Larger bore lumens were then locked with heparin walls. The catheter was secured at the skin with Prolene retention sutures. FINDINGS: By ultrasound, the right internal jugular vein is abnormal and tapers to the point of severe stenosis/occlusion in the lower neck. There are multiple collateral veins visualized in the neck which communicate with the external jugular vein. The external jugular vein is tortuous and can  be seen  entering the medial subclavian vein. Course of the external jugular vein was not favorable for non tunneled dialysis catheter placement. The left internal jugular vein is patent in the neck and demonstrates wall thickening and internal web formation consistent with prior access and potential prior partial thrombosis. After access of the vein, a guidewire would not advance across the midline of the chest roughly at the level of the mid left brachiocephalic vein. 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 Catheter placement via the left jugular vein was therefore abandoned. There are old dialysis grafts in both proximal thighs. There is calcified and shadowing subcutaneous graft material in both proximal thighs that affects sonographic visualization of the femoral veins. The left common femoral vein was better visualized compared to the right. After access of a patent left common femoral vein, a non tunneled temporary dialysis catheter was able to be advanced with the catheter tip located in the left common iliac vein nearly at the iliac vein confluence/lower IVC. This catheter is ready for immediate use. IMPRESSION: 1. 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 Course of the right external jugular vein was not favorable for catheter placement. 2. 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 3. Successful placement of non tunneled temporary dialysis catheter via the left common femoral vein with the catheter tip located in the left common iliac vein nearly at the level of the iliac venous confluence. Electronically Signed   By: Aletta Edouard M.D.   On: 06/26/2021 13:45   DG CHEST PORT 1 VIEW  Result Date: 06/22/2021 CLINICAL DATA:  Shortness of breath. EXAM: PORTABLE CHEST 1 VIEW COMPARISON:  06/21/2021. FINDINGS: Stable severe cardiomegaly. No pulmonary venous congestion. No focal infiltrate. No pleural effusion or pneumothorax. Degenerative change thoracic spine. IMPRESSION: 1.  Stable cardiomegaly.  No pulmonary venous congestion. 2.  No acute pulmonary disease. Electronically Signed   By: Marcello Moores  Register   On: 06/22/2021 06:28   DG CHEST PORT 1 VIEW  Result Date: 06/21/2021 CLINICAL DATA:  Fever. EXAM: PORTABLE CHEST 1 VIEW COMPARISON:  Radiograph yesterday. FINDINGS: Stable enlarged cardiac silhouette. Unchanged mediastinal contours. Slight increasing vascular congestion. No confluent consolidation. Minor right lung base atelectasis. No pneumothorax or pleural effusion. No acute osseous abnormalities. IMPRESSION: 1. Stable cardiomegaly. Slight increasing vascular congestion. 2. Minor right lung base atelectasis. Electronically Signed   By: Keith Rake M.D.   On: 06/21/2021 22:48   DG Chest Portable 1 View  Result Date: 06/19/2021 CLINICAL DATA:  Recent right hip fracture, lethargy, nausea EXAM: PORTABLE CHEST 1 VIEW COMPARISON:  06/10/2021 FINDINGS: Single frontal view of the chest demonstrates an enlarged cardiac silhouette. There is chronic central vascular congestion without airspace disease, effusion, or pneumothorax. No acute bony abnormalities. IMPRESSION: 1. Stable enlarged cardiac silhouette and vascular congestion. No acute airspace disease. Electronically Signed   By: Randa Ngo M.D.   On: 06/18/2021 16:37   EEG adult  Result Date: 06/22/2021 Lora Havens, MD     06/22/2021  3:34 PM Patient Name: David [name] MRN: [id number] Epilepsy Attending: Lora Havens Referring Physician/Provider: Dr Carlisle Cater Date: 06/22/2021 Duration: 24.32 mins Patient history: 80 year old male with altered  mental status.  EEG to evaluate for seizures. Level of alertness: Awake, asleep AEDs during EEG study: None Technical aspects: This EEG study was done with scalp electrodes positioned according to the 10-20 International system of electrode placement. Electrical activity was acquired at a sampling rate of 500Hz  and reviewed with a high frequency filter of 70Hz  and a low frequency filter of 1Hz . EEG data were recorded continuously and digitally stored. Description: No clear posterior dominant rhythm was seen. Sleep was characterized by vertex waves, sleep spindles (12 to 14 Hz), maximal frontocentral region.  EEG also showed continuous 6-8 Hz theta-alpha activity as well as intermittent generalized 2 to 3 Hz delta slowing.  Hyperventilation and photic stimulation were not performed.   ABNORMALITY - Continuous slow, generalized IMPRESSION: This study is suggestive of moderate diffuse encephalopathy, nonspecific etiology. No seizures or epileptiform discharges were seen throughout the recording. Lora Havens   DG C-Arm [phone] Min  Result Date: 06/12/2021 CLINICAL DATA:  RIGHT hip pinning. EXAM: OPERATIVE RIGHT HIP (WITH PELVIS IF PERFORMED) 3 VIEWS TECHNIQUE: Fluoroscopic spot image(s) were submitted for interpretation post-operatively. COMPARISON:  Plain film of the pelvis and RIGHT hip dated 06/10/2021. FINDINGS: Intraoperative fluoroscopic images showing 3 fixation screws traversing the RIGHT femoral neck fracture site. Screws appear intact and appropriately positioned. Fluoroscopy provided for 46 seconds. IMPRESSION: Intraoperative films demonstrating screw fixation of the RIGHT femoral neck fracture. Hardware appears intact and appropriately positioned. Electronically Signed   By: Franki Cabot M.D.   On: 06/12/2021 10:14   ECHOCARDIOGRAM COMPLETE  Result Date: 06/21/2021    ECHOCARDIOGRAM REPORT   Patient Name:   DESIDERIO DOLATA Whetsell Date of Exam: 06/21/2021 Medical Rec #:  [id number]            Height:        74.0 in Accession #:    [id number]           Weight:       177.7 lb Date of Birth:  December 15, 1940            BSA:          2.067 m Patient Age:    54 years             BP:           126/66 mmHg Patient Gender: M                    HR:           89 bpm. Exam Location:  Inpatient Procedure: 2D Echo, Cardiac Doppler and Color Doppler Indications:    I50.23 Acute on chronic systolic (congestive) heart failure  History:        Patient has prior history of Echocardiogram examinations, most                 recent 12/01/2020. Cardiomyopathy, Stroke, Arrythmias:Atrial                 Fibrillation, Signs/Symptoms:Murmur; Risk Factors:Hypertension,                 Dyslipidemia and Sleep Apnea. ESRD. COVID-19. GERD.  Sonographer:    Jonelle Sidle Dance Referring Phys: [id number] [place]  1. Left ventricular ejection fraction, by estimation, is 20 to 25%. The left ventricle has severely decreased function. The left ventricle demonstrates global hypokinesis. The left ventricular internal cavity size was severely dilated. Left ventricular diastolic parameters are consistent with Grade II diastolic dysfunction (pseudonormalization). Elevated left atrial pressure.  2. Right ventricular systolic function is mildly reduced. The right ventricular size is severely enlarged. There is normal pulmonary artery systolic pressure. The estimated right ventricular systolic pressure is 78.2 mmHg.  3. Left atrial size was moderately dilated.  4. Right atrial size was mildly dilated.  5. The mitral valve is normal in structure. Severe mitral valve regurgitation.  6. The aortic valve is tricuspid. Aortic valve regurgitation is mild to moderate. Mild to moderate aortic valve stenosis.  7. There is mild dilatation of the ascending aorta, measuring 42 mm.  8. The inferior vena cava is dilated in size with >50% respiratory variability, suggesting right atrial pressure of 8 mmHg. Comparison(s): Prior images reviewed side by side. The left  ventricular function is unchanged. The right ventricular systolic function is worse. Mitral regurgitation appears more severe. There is systolic flow reversal in the pulmonary veins. In a hemodialysis patient, this may be due to different loading conditions. FINDINGS  Left Ventricle: Left ventricular ejection fraction, by estimation, is 20 to 25%. The left ventricle has severely decreased function. The left ventricle demonstrates global hypokinesis. The left ventricular internal cavity size was severely dilated. There is no left ventricular hypertrophy. Abnormal (paradoxical) septal motion, consistent with left bundle branch block. Left ventricular diastolic parameters are consistent with Grade II diastolic dysfunction (pseudonormalization). Elevated left atrial  pressure. Right Ventricle: The right ventricular size is severely enlarged. No increase in right ventricular wall thickness. Right ventricular systolic function is mildly reduced. There is normal pulmonary artery systolic pressure. The tricuspid regurgitant velocity is 2.44 m/s, and with an assumed right atrial pressure of 8 mmHg, the estimated right ventricular systolic pressure is 95.6 mmHg. Left Atrium: Left atrial size was moderately dilated. Right Atrium: Right atrial size was mildly dilated. Pericardium: Trivial pericardial effusion is present. 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 The mitral valve is normal in structure. Severe mitral valve regurgitation, with centrally-directed jet. Tricuspid Valve: The tricuspid valve is normal in structure. Tricuspid valve regurgitation is trivial. Aortic Valve: The aortic valve is tricuspid. Aortic valve regurgitation is mild to moderate. Aortic regurgitation PHT measures 359 msec. Mild to moderate aortic stenosis is present. Aortic valve mean gradient measures 11.0 mmHg. Aortic valve peak gradient measures 21.3 mmHg. Aortic valve area, by VTI measures 1.26 cm. Pulmonic  Valve: The pulmonic valve was grossly normal. Pulmonic valve regurgitation is not visualized. Aorta: The aortic root is normal in size and structure. There is mild dilatation of the ascending aorta, measuring 42 mm. Venous: A pattern of systolic flow reversal, suggestive of severe mitral regurgitation is recorded from the right upper pulmonary vein. The inferior vena cava is dilated in size with greater than 50% respiratory variability, suggesting right atrial pressure of 8 mmHg. IAS/Shunts: No atrial level shunt detected by color flow Doppler.  LEFT VENTRICLE PLAX 2D LVIDd:         7.07 cm  Diastology LVIDs:         6.20 cm  LV e' medial:    4.03 cm/s LV PW:         1.60 cm  LV E/e' medial:  35.0 LV IVS:        1.00 cm  LV e' lateral:   6.42 cm/s LVOT diam:     1.90 cm  LV E/e' lateral: 22.0 LV SV:         47 LV SV Index:   23 LVOT Area:     2.84 cm  RIGHT VENTRICLE             IVC RV Basal diam:  3.80 cm     IVC diam: 2.10 cm RV Mid diam:    2.80 cm RV S prime:     11.60 cm/s TAPSE (M-mode): 2.0 cm LEFT ATRIUM             Index       RIGHT ATRIUM           Index LA diam:        4.30 cm 2.08 cm/m  RA Area:     18.40 cm LA Vol (A2C):   79.3 ml 38.37 ml/m RA Volume:   51.50 ml  24.92 ml/m LA Vol (A4C):   68.8 ml 33.29 ml/m LA Biplane Vol: 74.4 ml 36.00 ml/m  AORTIC VALVE AV Area (Vmax):    1.29 cm AV Area (Vmean):   1.25 cm AV Area (VTI):     1.26 cm AV Vmax:           231.00 cm/s AV Vmean:          157.500 cm/s AV VTI:            0.375 m AV Peak Grad:      21.3 mmHg AV Mean Grad:      11.0 mmHg LVOT Vmax:         105.00 cm/s LVOT Vmean:        69.200 cm/s LVOT VTI:          0.167 m LVOT/AV VTI ratio: 0.45 AI PHT:            359 msec  AORTA Ao Root diam: 3.00 cm Ao Asc diam:  4.20 cm MITRAL VALVE                 TRICUSPID VALVE MV Area (PHT): 3.60 cm      TR Peak grad:   23.8 mmHg MV Decel Time: 211 msec      TR Vmax:        244.00 cm/s MR Peak grad:    95.8 mmHg MR Mean grad:    59.5 mmHg   SHUNTS MR  Vmax:         489.50 cm/s Systemic VTI:  0.17 m MR Vmean:        364.0 cm/s  Systemic Diam: 1.90 cm MR PISA:         1.01 cm MR PISA Eff ROA: 8 mm MR PISA Radius:  0.40 cm MV E velocity: 141.00 cm/s MV A velocity: 110.00 cm/s MV E/A ratio:  1.28 Mihai Croitoru MD Electronically signed by [name] MD Signature Date/Time: 06/21/2021/3:17:41 PM    Final    DG HIP OPERATIVE UNILAT WITH PELVIS RIGHT  Result Date: 06/12/2021 CLINICAL DATA:  RIGHT hip pinning. EXAM: OPERATIVE RIGHT HIP (WITH PELVIS IF PERFORMED) 3 VIEWS TECHNIQUE: Fluoroscopic spot image(s) were submitted for interpretation post-operatively. COMPARISON:  Plain film of the pelvis and RIGHT hip dated 06/10/2021. FINDINGS: Intraoperative fluoroscopic images showing 3 fixation screws traversing the RIGHT femoral neck fracture site. Screws appear intact and appropriately positioned. Fluoroscopy provided for 46 seconds. IMPRESSION: Intraoperative films demonstrating screw fixation of the RIGHT femoral neck fracture. Hardware appears intact and appropriately positioned. Electronically Signed   By: Franki Cabot M.D.   On: 06/12/2021 10:14   DG Hip Unilat With Pelvis 2-3 Views Right  Result Date: 06/10/2021 CLINICAL DATA:  Recent fall with right hip pain, initial encounter EXAM: DG  HIP (WITH OR WITHOUT PELVIS) 3V RIGHT COMPARISON:  12/15/2020 FINDINGS: There are changes consistent with an undisplaced subcapital femoral neck fracture on the right. Very mild impaction at the fracture site is noted. These changes are new from the prior exam. Pelvic ring is intact. 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 Prostate seeds are again seen. IMPRESSION: Subcapital right femoral neck fracture with very mild impaction at the fracture site. Electronically Signed   By: Inez Catalina M.D.   On: 06/10/2021 19:41    Microbiology Recent Results (from the past 240 hour(s))  Blood culture (routine x 2)     Status: None   Collection Time:  06/24/2021  3:53 PM   Specimen: BLOOD  Result Value Ref Range Status   Specimen Description BLOOD SITE NOT SPECIFIED  Final   Special Requests   Final    BOTTLES DRAWN AEROBIC AND ANAEROBIC Blood Culture results may not be optimal due to an excessive volume of blood received in culture bottles IV START   Culture   Final    NO GROWTH 5 DAYS Performed at [facility], [place] [address] [place], [place] [address]    Report Status 06/25/2021 FINAL  Final  SARS CORONAVIRUS 2 (TAT 6-24 HRS) Nasopharyngeal Nasopharyngeal Swab     Status: None   Collection Time: 07/03/2021  4:50 PM   Specimen: Nasopharyngeal Swab  Result Value Ref Range Status   SARS Coronavirus 2 NEGATIVE NEGATIVE Final    Comment: (NOTE) SARS-CoV-2 target nucleic acids are NOT DETECTED.  The SARS-CoV-2 RNA is generally detectable in upper and lower respiratory specimens during the acute phase of infection. Negative results do not preclude SARS-CoV-2 infection, do not rule out co-infections with other pathogens, and should not be used as the sole basis for treatment or other patient management decisions. Negative results must be combined with clinical observations, patient history, and epidemiological information. The expected result is Negative.  Fact Sheet for Patients: SugarRoll.be  Fact Sheet for Healthcare Providers: https://www.woods-mathews.com/  This test is not yet approved or cleared by the [place] FDA and  has been authorized for detection and/or diagnosis of SARS-CoV-2 by FDA under an Emergency Use Authorization (EUA). This EUA will remain  in effect (meaning this test can be used) for the duration of the COVID-19 declaration under Se ction 564(b)(1) of the Act, 21 U.S.C. section 360bbb-3(b)(1), unless the authorization is terminated or revoked sooner.  Performed at [facility], [place] [address], [place], [place] [address]   Blood culture (routine x  2)     Status: None   Collection Time: 06/12/2021  9:08 PM   Specimen: BLOOD  Result Value Ref Range Status   Specimen Description BLOOD LEFT HAND  Final   Special Requests   Final    BOTTLES DRAWN AEROBIC AND ANAEROBIC Blood Culture adequate volume   Culture   Final    NO GROWTH 5 DAYS Performed at [facility], [place] [address] [place], [place] [address]    Report Status 06/25/2021 FINAL  Final  Culture, blood (routine x 2)     Status: None   Collection Time: 06/21/21 11:07 PM   Specimen: BLOOD LEFT HAND  Result Value Ref Range Status   Specimen Description BLOOD LEFT HAND  Final   Special Requests   Final    BOTTLES DRAWN AEROBIC ONLY Blood Culture results may not be optimal due to an inadequate volume of blood received in culture bottles  Culture   Final    NO GROWTH 5 DAYS Performed at [facility], [place] [address] [place], [place] [address]    Report Status 06/27/2021 FINAL  Final  Culture, blood (routine x 2)     Status: None   Collection Time: 06/21/21 11:14 PM   Specimen: BLOOD  Result Value Ref Range Status   Specimen Description BLOOD LEFT THUMB  Final   Special Requests   Final    BOTTLES DRAWN AEROBIC ONLY Blood Culture results may not be optimal due to an inadequate volume of blood received in culture bottles   Culture   Final    NO GROWTH 5 DAYS Performed at [facility], [place] [address], [place], [place] [address]    Report Status 06/27/2021 FINAL  Final  Urine Culture     Status: Abnormal   Collection Time: 06/22/21 10:02 AM   Specimen: Urine, Clean Catch  Result Value Ref Range Status   Specimen Description URINE, CLEAN CATCH  Final   Special Requests   Final    NONE Performed at [facility], [place] [address] [place], [place] [address]    Culture (A)  Final    >=100,000 COLONIES/mL ACINETOBACTER CALCOACETICUS/BAUMANNII COMPLEX   Report Status 06/24/2021 FINAL  Final   Organism ID, Bacteria ACINETOBACTER CALCOACETICUS/BAUMANNII  COMPLEX (A)  Final      Susceptibility   Acinetobacter calcoaceticus/baumannii complex - MIC*    CEFTAZIDIME 8 SENSITIVE Sensitive     CIPROFLOXACIN 0.5 SENSITIVE Sensitive     GENTAMICIN <=1 SENSITIVE Sensitive     IMIPENEM <=0.25 SENSITIVE Sensitive     PIP/TAZO <=4 SENSITIVE Sensitive     TRIMETH/SULFA <=20 SENSITIVE Sensitive     AMPICILLIN/SULBACTAM <=2 SENSITIVE Sensitive     * >=100,000 COLONIES/mL ACINETOBACTER CALCOACETICUS/BAUMANNII COMPLEX    Lab Basic Metabolic Panel: Recent Labs  Lab 06/23/21 0349 06/24/21 0310 06/25/21 0247 06/26/21 0343  NA 139 137 136 138  K 5.2* 4.1 3.9 5.1  CL 94* 94* 92* 91*  CO2 22 26 24  21*  GLUCOSE 120* 123* 110* 143*  BUN 74* 65* 87* 107*  CREATININE 9.33* 7.75* 9.60* 11.44*  CALCIUM 9.5 9.1 9.3 10.1  PHOS 10.4* 7.7* 9.0* >30.0*   Liver Function Tests: Recent Labs  Lab 06/23/21 0349 06/24/21 0310 06/25/21 0247 06/26/21 0343  ALBUMIN 2.7* 2.6* 2.4* 2.6*   No results for input(s): LIPASE, AMYLASE in the last 168 hours. No results for input(s): AMMONIA in the last 168 hours. CBC: Recent Labs  Lab 06/23/21 0349 06/24/21 0310 06/25/21 0247 06/26/21 0343  WBC 13.1* 9.7 11.2* 18.2*  HGB 11.1* 9.6* 9.6* 11.4*  HCT 32.1* 28.4* 28.6* 33.7*  MCV 102.9* 102.2* 102.5* 100.9*  PLT 276 276 263 346   Cardiac Enzymes: No results for input(s): CKTOTAL, CKMB, CKMBINDEX, TROPONINI in the last 168 hours. Sepsis Labs: Recent Labs  Lab 06/23/21 0349 06/24/21 0310 06/25/21 0247 06/26/21 0343  PROCALCITON 29.91 26.07 22.80  --   WBC 13.1* 9.7 11.2* 18.2*    Procedures/Operations  Tunneled dialysis catheter placed on 06/26/2021  Adline Peals Brithney Bensen 06/29/2021, 11:51 AM

## 2021-07-04 NOTE — Progress Notes (Signed)
Nutrition Brief Note  Chart reviewed. Pt now transitioning to comfort care.  No further nutrition interventions planned at this time.  Please re-consult as needed.   Amoree Newlon W, RD, LDN, CDCES Registered Dietitian II Certified Diabetes Care and Education Specialist Please refer to AMION for RD and/or RD on-call/weekend/after hours pager   

## 2021-07-04 DEATH — deceased

## 2021-07-13 ENCOUNTER — Ambulatory Visit: Payer: Medicare HMO | Admitting: Family

## 2021-07-27 ENCOUNTER — Ambulatory Visit: Payer: Medicare HMO | Admitting: Infectious Disease

## 2021-08-26 ENCOUNTER — Ambulatory Visit: Payer: Medicare HMO | Admitting: Family

## 2021-10-07 ENCOUNTER — Ambulatory Visit: Payer: Medicare HMO | Admitting: Cardiovascular Disease
